# Patient Record
Sex: Female | Born: 1960 | Race: White | Hispanic: No | Marital: Married | Smoking: Former smoker
Health system: Southern US, Community
[De-identification: ages and names within clinical notes are randomized; demographics above are authoritative.]

## PROBLEM LIST (undated history)

## (undated) DIAGNOSIS — E538 Deficiency of other specified B group vitamins: Secondary | ICD-10-CM

## (undated) DIAGNOSIS — F419 Anxiety disorder, unspecified: Secondary | ICD-10-CM

## (undated) DIAGNOSIS — T7840XA Allergy, unspecified, initial encounter: Secondary | ICD-10-CM

## (undated) DIAGNOSIS — E1110 Type 2 diabetes mellitus with ketoacidosis without coma: Secondary | ICD-10-CM

## (undated) DIAGNOSIS — N182 Chronic kidney disease, stage 2 (mild): Secondary | ICD-10-CM

## (undated) DIAGNOSIS — G629 Polyneuropathy, unspecified: Secondary | ICD-10-CM

## (undated) DIAGNOSIS — R42 Dizziness and giddiness: Secondary | ICD-10-CM

## (undated) DIAGNOSIS — R079 Chest pain, unspecified: Secondary | ICD-10-CM

## (undated) DIAGNOSIS — E039 Hypothyroidism, unspecified: Secondary | ICD-10-CM

## (undated) DIAGNOSIS — E785 Hyperlipidemia, unspecified: Secondary | ICD-10-CM

## (undated) DIAGNOSIS — R252 Cramp and spasm: Secondary | ICD-10-CM

## (undated) DIAGNOSIS — IMO0002 Reserved for concepts with insufficient information to code with codable children: Secondary | ICD-10-CM

## (undated) DIAGNOSIS — I10 Essential (primary) hypertension: Secondary | ICD-10-CM

## (undated) DIAGNOSIS — K589 Irritable bowel syndrome without diarrhea: Secondary | ICD-10-CM

## (undated) DIAGNOSIS — K429 Umbilical hernia without obstruction or gangrene: Secondary | ICD-10-CM

## (undated) DIAGNOSIS — I509 Heart failure, unspecified: Secondary | ICD-10-CM

## (undated) DIAGNOSIS — E1065 Type 1 diabetes mellitus with hyperglycemia: Secondary | ICD-10-CM

## (undated) DIAGNOSIS — K219 Gastro-esophageal reflux disease without esophagitis: Secondary | ICD-10-CM

## (undated) HISTORY — DX: Anxiety disorder, unspecified: F41.9

## (undated) HISTORY — DX: Hyperlipidemia, unspecified: E78.5

## (undated) HISTORY — DX: Irritable bowel syndrome, unspecified: K58.9

## (undated) HISTORY — DX: Dizziness and giddiness: R42

## (undated) HISTORY — DX: Polyneuropathy, unspecified: G62.9

## (undated) HISTORY — DX: Deficiency of other specified B group vitamins: E53.8

## (undated) HISTORY — DX: Allergy, unspecified, initial encounter: T78.40XA

## (undated) HISTORY — DX: Gastro-esophageal reflux disease without esophagitis: K21.9

## (undated) HISTORY — DX: Hypothyroidism, unspecified: E03.9

---

## 1990-12-10 HISTORY — PX: TUBAL LIGATION: SHX77

## 2000-01-29 ENCOUNTER — Encounter (INDEPENDENT_AMBULATORY_CARE_PROVIDER_SITE_OTHER): Payer: Self-pay

## 2000-01-29 ENCOUNTER — Other Ambulatory Visit: Admission: RE | Admit: 2000-01-29 | Discharge: 2000-01-29 | Payer: Self-pay | Admitting: Gastroenterology

## 2001-08-20 ENCOUNTER — Emergency Department (HOSPITAL_COMMUNITY): Admission: EM | Admit: 2001-08-20 | Discharge: 2001-08-20 | Payer: Self-pay | Admitting: Emergency Medicine

## 2001-08-20 ENCOUNTER — Encounter: Payer: Self-pay | Admitting: Emergency Medicine

## 2005-06-18 ENCOUNTER — Ambulatory Visit: Payer: Self-pay | Admitting: Internal Medicine

## 2007-08-31 ENCOUNTER — Emergency Department (HOSPITAL_COMMUNITY): Admission: EM | Admit: 2007-08-31 | Discharge: 2007-08-31 | Payer: Self-pay | Admitting: Emergency Medicine

## 2008-01-19 ENCOUNTER — Observation Stay (HOSPITAL_COMMUNITY): Admission: EM | Admit: 2008-01-19 | Discharge: 2008-01-20 | Payer: Self-pay | Admitting: Emergency Medicine

## 2008-01-19 ENCOUNTER — Ambulatory Visit: Payer: Self-pay | Admitting: Family Medicine

## 2008-02-10 ENCOUNTER — Observation Stay (HOSPITAL_COMMUNITY): Admission: AD | Admit: 2008-02-10 | Discharge: 2008-02-13 | Payer: Self-pay | Admitting: Gastroenterology

## 2008-02-10 ENCOUNTER — Ambulatory Visit: Payer: Self-pay | Admitting: Gastroenterology

## 2008-02-12 ENCOUNTER — Encounter: Payer: Self-pay | Admitting: Gastroenterology

## 2008-02-13 ENCOUNTER — Encounter: Payer: Self-pay | Admitting: Gastroenterology

## 2008-02-16 ENCOUNTER — Ambulatory Visit: Payer: Self-pay | Admitting: Gastroenterology

## 2008-03-04 ENCOUNTER — Ambulatory Visit: Payer: Self-pay | Admitting: Gastroenterology

## 2008-03-10 ENCOUNTER — Ambulatory Visit (HOSPITAL_COMMUNITY): Admission: RE | Admit: 2008-03-10 | Discharge: 2008-03-10 | Payer: Self-pay | Admitting: Gastroenterology

## 2008-04-29 ENCOUNTER — Ambulatory Visit: Payer: Self-pay | Admitting: Family Medicine

## 2008-04-29 DIAGNOSIS — K589 Irritable bowel syndrome without diarrhea: Secondary | ICD-10-CM | POA: Insufficient documentation

## 2008-04-29 DIAGNOSIS — G43009 Migraine without aura, not intractable, without status migrainosus: Secondary | ICD-10-CM | POA: Insufficient documentation

## 2008-04-29 DIAGNOSIS — E039 Hypothyroidism, unspecified: Secondary | ICD-10-CM | POA: Insufficient documentation

## 2008-04-29 DIAGNOSIS — E785 Hyperlipidemia, unspecified: Secondary | ICD-10-CM | POA: Insufficient documentation

## 2008-04-30 ENCOUNTER — Encounter: Payer: Self-pay | Admitting: Family Medicine

## 2008-06-16 ENCOUNTER — Ambulatory Visit: Payer: Self-pay | Admitting: Family Medicine

## 2008-06-22 ENCOUNTER — Encounter: Payer: Self-pay | Admitting: Family Medicine

## 2008-06-22 ENCOUNTER — Other Ambulatory Visit: Admission: RE | Admit: 2008-06-22 | Discharge: 2008-06-22 | Payer: Self-pay | Admitting: Family Medicine

## 2008-06-22 ENCOUNTER — Telehealth: Payer: Self-pay | Admitting: Family Medicine

## 2008-06-22 ENCOUNTER — Ambulatory Visit: Payer: Self-pay | Admitting: Family Medicine

## 2008-06-22 DIAGNOSIS — F411 Generalized anxiety disorder: Secondary | ICD-10-CM | POA: Insufficient documentation

## 2008-06-22 LAB — CONVERTED CEMR LAB: Pap Smear: NORMAL

## 2008-06-23 ENCOUNTER — Encounter: Admission: RE | Admit: 2008-06-23 | Discharge: 2008-06-23 | Payer: Self-pay | Admitting: Family Medicine

## 2008-06-23 LAB — CONVERTED CEMR LAB
ALT: 12 units/L (ref 0–35)
AST: 16 units/L (ref 0–37)
Albumin: 3.6 g/dL (ref 3.5–5.2)
Alkaline Phosphatase: 61 units/L (ref 39–117)
BUN: 9 mg/dL (ref 6–23)
Bilirubin, Direct: 0.1 mg/dL (ref 0.0–0.3)
CO2: 28 meq/L (ref 19–32)
Calcium: 9 mg/dL (ref 8.4–10.5)
Chloride: 104 meq/L (ref 96–112)
Cholesterol: 219 mg/dL (ref 0–200)
Creatinine, Ser: 0.9 mg/dL (ref 0.4–1.2)
Direct LDL: 179 mg/dL
GFR calc Af Amer: 87 mL/min
GFR calc non Af Amer: 72 mL/min
Glucose, Bld: 103 mg/dL — ABNORMAL HIGH (ref 70–99)
HDL: 28.6 mg/dL — ABNORMAL LOW (ref >39.0)
Hgb A1c MFr Bld: 6.3 % — ABNORMAL HIGH (ref 4.6–6.0)
Potassium: 4.1 meq/L (ref 3.5–5.1)
Sodium: 138 meq/L (ref 135–145)
Total Bilirubin: 0.7 mg/dL (ref 0.3–1.2)
Total CHOL/HDL Ratio: 7.7
Total Protein: 6.8 g/dL (ref 6.0–8.3)
Triglycerides: 126 mg/dL (ref 0–149)
VLDL: 25 mg/dL (ref 0–40)

## 2008-06-24 ENCOUNTER — Encounter (INDEPENDENT_AMBULATORY_CARE_PROVIDER_SITE_OTHER): Payer: Self-pay | Admitting: *Deleted

## 2008-06-29 ENCOUNTER — Encounter: Payer: Self-pay | Admitting: Family Medicine

## 2008-06-29 ENCOUNTER — Encounter: Admission: RE | Admit: 2008-06-29 | Discharge: 2008-08-31 | Payer: Self-pay | Admitting: Family Medicine

## 2008-07-12 ENCOUNTER — Telehealth: Payer: Self-pay | Admitting: Internal Medicine

## 2008-09-30 ENCOUNTER — Ambulatory Visit: Payer: Self-pay | Admitting: Family Medicine

## 2008-10-05 ENCOUNTER — Ambulatory Visit: Payer: Self-pay | Admitting: Family Medicine

## 2008-10-05 LAB — CONVERTED CEMR LAB
Cholesterol, target level: 200 mg/dL
HDL goal, serum: 40 mg/dL
LDL Goal: 100 mg/dL

## 2008-10-14 ENCOUNTER — Emergency Department (HOSPITAL_COMMUNITY): Admission: EM | Admit: 2008-10-14 | Discharge: 2008-10-14 | Payer: Self-pay | Admitting: Emergency Medicine

## 2008-10-20 LAB — CONVERTED CEMR LAB
Glucose, Bld: 125 mg/dL — ABNORMAL HIGH (ref 70–99)
HDL: 30.1 mg/dL — ABNORMAL LOW (ref >39.0)
Hgb A1c MFr Bld: 6.2 % — ABNORMAL HIGH (ref 4.6–6.0)
Triglycerides: 128 mg/dL (ref 0–149)
VLDL: 26 mg/dL (ref 0–40)

## 2009-01-04 ENCOUNTER — Ambulatory Visit: Payer: Self-pay | Admitting: Family Medicine

## 2009-01-05 LAB — CONVERTED CEMR LAB
ALT: 12 units/L (ref 0–35)
AST: 15 units/L (ref 0–37)
Albumin: 3.8 g/dL (ref 3.5–5.2)
BUN: 17 mg/dL (ref 6–23)
CO2: 27 meq/L (ref 19–32)
Calcium: 9.2 mg/dL (ref 8.4–10.5)
Chloride: 103 meq/L (ref 96–112)
Cholesterol: 199 mg/dL (ref 0–200)
Creatinine, Ser: 0.9 mg/dL (ref 0.4–1.2)
Creatinine,U: 35.8 mg/dL
HDL: 36.9 mg/dL — ABNORMAL LOW (ref >39.0)
LDL Cholesterol: 140 mg/dL — ABNORMAL HIGH (ref 0–99)
Microalb, Ur: 0.8 mg/dL (ref 0.0–1.9)
Total Bilirubin: 0.4 mg/dL (ref 0.3–1.2)
Total CHOL/HDL Ratio: 5.4
Triglycerides: 112 mg/dL (ref 0–149)

## 2009-01-07 ENCOUNTER — Ambulatory Visit: Payer: Self-pay | Admitting: Family Medicine

## 2009-01-10 LAB — HM DIABETES EYE EXAM: HM Diabetic Eye Exam: NORMAL

## 2009-03-08 ENCOUNTER — Encounter: Payer: Self-pay | Admitting: Internal Medicine

## 2009-03-08 ENCOUNTER — Observation Stay (HOSPITAL_COMMUNITY): Admission: EM | Admit: 2009-03-08 | Discharge: 2009-03-10 | Payer: Self-pay | Admitting: Emergency Medicine

## 2009-03-08 ENCOUNTER — Ambulatory Visit: Payer: Self-pay | Admitting: Internal Medicine

## 2009-03-08 ENCOUNTER — Ambulatory Visit: Payer: Self-pay | Admitting: Gastroenterology

## 2009-04-01 ENCOUNTER — Ambulatory Visit: Payer: Self-pay | Admitting: Family Medicine

## 2009-04-01 LAB — CONVERTED CEMR LAB
ALT: 14 units/L (ref 0–35)
AST: 20 units/L (ref 0–37)
Albumin: 3.6 g/dL (ref 3.5–5.2)
Alkaline Phosphatase: 63 units/L (ref 39–117)
BUN: 11 mg/dL (ref 6–23)
CO2: 27 meq/L (ref 19–32)
Chloride: 107 meq/L (ref 96–112)
Cholesterol: 185 mg/dL (ref 0–200)
Glucose, Bld: 131 mg/dL — ABNORMAL HIGH (ref 70–99)
Hgb A1c MFr Bld: 6.4 % (ref 4.6–6.5)
Potassium: 3.9 meq/L (ref 3.5–5.1)
Sodium: 139 meq/L (ref 135–145)
TSH: 2.24 microintl units/mL (ref 0.35–5.50)
Total Protein: 6.7 g/dL (ref 6.0–8.3)
VLDL: 20.4 mg/dL (ref 0.0–40.0)

## 2009-04-08 ENCOUNTER — Ambulatory Visit: Payer: Self-pay | Admitting: Family Medicine

## 2009-04-08 DIAGNOSIS — K219 Gastro-esophageal reflux disease without esophagitis: Secondary | ICD-10-CM | POA: Insufficient documentation

## 2009-05-10 ENCOUNTER — Ambulatory Visit: Payer: Self-pay | Admitting: Family Medicine

## 2009-05-10 DIAGNOSIS — G629 Polyneuropathy, unspecified: Secondary | ICD-10-CM

## 2009-07-14 ENCOUNTER — Ambulatory Visit: Payer: Self-pay | Admitting: Family Medicine

## 2009-07-14 DIAGNOSIS — R5381 Other malaise: Secondary | ICD-10-CM | POA: Insufficient documentation

## 2009-07-14 DIAGNOSIS — R5383 Other fatigue: Secondary | ICD-10-CM

## 2009-07-15 ENCOUNTER — Ambulatory Visit: Payer: Self-pay | Admitting: Family Medicine

## 2009-07-15 LAB — CONVERTED CEMR LAB
Albumin: 3.8 g/dL (ref 3.5–5.2)
Alkaline Phosphatase: 63 units/L (ref 39–117)
BUN: 12 mg/dL (ref 6–23)
CO2: 21 meq/L (ref 19–32)
Calcium: 9.3 mg/dL (ref 8.4–10.5)
Cholesterol: 174 mg/dL (ref 0–200)
Glucose, Bld: 207 mg/dL — ABNORMAL HIGH (ref 70–99)
HDL: 34.6 mg/dL — ABNORMAL LOW (ref >39.00)
Sodium: 136 meq/L (ref 135–145)
Total Protein: 7.1 g/dL (ref 6.0–8.3)
Triglycerides: 151 mg/dL — ABNORMAL HIGH (ref 0.0–149.0)
Vitamin B-12: 258 pg/mL (ref 211–911)

## 2009-09-09 ENCOUNTER — Telehealth: Payer: Self-pay | Admitting: Family Medicine

## 2009-10-17 ENCOUNTER — Ambulatory Visit: Payer: Self-pay | Admitting: Family Medicine

## 2009-10-17 LAB — CONVERTED CEMR LAB
Albumin: 3.9 g/dL (ref 3.5–5.2)
BUN: 10 mg/dL (ref 6–23)
Calcium: 8.9 mg/dL (ref 8.4–10.5)
Cholesterol: 215 mg/dL — ABNORMAL HIGH (ref 0–200)
GFR calc non Af Amer: 71.55 mL/min (ref >60)
Glucose, Bld: 164 mg/dL — ABNORMAL HIGH (ref 70–99)
HDL: 32.4 mg/dL — ABNORMAL LOW (ref >39.00)
Potassium: 3.5 meq/L (ref 3.5–5.1)
Sodium: 137 meq/L (ref 135–145)
VLDL: 22.2 mg/dL (ref 0.0–40.0)

## 2009-10-20 ENCOUNTER — Ambulatory Visit: Payer: Self-pay | Admitting: Family Medicine

## 2009-10-21 ENCOUNTER — Encounter (INDEPENDENT_AMBULATORY_CARE_PROVIDER_SITE_OTHER): Payer: Self-pay | Admitting: *Deleted

## 2009-12-27 ENCOUNTER — Encounter (INDEPENDENT_AMBULATORY_CARE_PROVIDER_SITE_OTHER): Payer: Self-pay | Admitting: *Deleted

## 2009-12-28 ENCOUNTER — Ambulatory Visit: Payer: Self-pay | Admitting: Family Medicine

## 2009-12-29 LAB — CONVERTED CEMR LAB
ALT: 13 units/L (ref 0–35)
BUN: 10 mg/dL (ref 6–23)
CO2: 25 meq/L (ref 19–32)
Chloride: 107 meq/L (ref 96–112)
Cholesterol: 198 mg/dL (ref 0–200)
Creatinine, Ser: 0.9 mg/dL (ref 0.4–1.2)
Glucose, Bld: 110 mg/dL — ABNORMAL HIGH (ref 70–99)
Hgb A1c MFr Bld: 6.6 % — ABNORMAL HIGH (ref 4.6–6.5)
Microalb Creat Ratio: 3.4 mg/g (ref 0.0–30.0)
Total CHOL/HDL Ratio: 6
Total Protein: 7 g/dL (ref 6.0–8.3)
Triglycerides: 115 mg/dL (ref 0.0–149.0)

## 2010-01-03 ENCOUNTER — Ambulatory Visit: Payer: Self-pay | Admitting: Family Medicine

## 2010-01-03 DIAGNOSIS — R252 Cramp and spasm: Secondary | ICD-10-CM

## 2010-03-29 ENCOUNTER — Ambulatory Visit: Payer: Self-pay | Admitting: Family Medicine

## 2010-03-29 LAB — CONVERTED CEMR LAB
ALT: 14 units/L (ref 0–35)
AST: 19 units/L (ref 0–37)
Alkaline Phosphatase: 66 units/L (ref 39–117)
BUN: 13 mg/dL (ref 6–23)
Bilirubin, Direct: 0.1 mg/dL (ref 0.0–0.3)
Direct LDL: 156.4 mg/dL
Eosinophils Relative: 2.2 % (ref 0.0–5.0)
GFR calc non Af Amer: 71.41 mL/min (ref >60)
HCT: 37.6 % (ref 36.0–46.0)
Hgb A1c MFr Bld: 6.8 % — ABNORMAL HIGH (ref 4.6–6.5)
Lymphocytes Relative: 28.7 % (ref 12.0–46.0)
Lymphs Abs: 3.1 10*3/uL (ref 0.7–4.0)
Monocytes Relative: 6.1 % (ref 3.0–12.0)
Platelets: 343 10*3/uL (ref 150.0–400.0)
Potassium: 4.2 meq/L (ref 3.5–5.1)
Sodium: 138 meq/L (ref 135–145)
TSH: 15.44 microintl units/mL — ABNORMAL HIGH (ref 0.35–5.50)
Total Bilirubin: 0.3 mg/dL (ref 0.3–1.2)
Total CHOL/HDL Ratio: 6
VLDL: 45.2 mg/dL — ABNORMAL HIGH (ref 0.0–40.0)
Vitamin B-12: 284 pg/mL (ref 211–911)
WBC: 10.9 10*3/uL — ABNORMAL HIGH (ref 4.5–10.5)

## 2010-04-04 ENCOUNTER — Ambulatory Visit: Payer: Self-pay | Admitting: Family Medicine

## 2010-04-04 DIAGNOSIS — E538 Deficiency of other specified B group vitamins: Secondary | ICD-10-CM | POA: Insufficient documentation

## 2010-04-14 ENCOUNTER — Emergency Department (HOSPITAL_COMMUNITY): Admission: EM | Admit: 2010-04-14 | Discharge: 2010-04-14 | Payer: Self-pay | Admitting: Emergency Medicine

## 2010-05-02 ENCOUNTER — Encounter (INDEPENDENT_AMBULATORY_CARE_PROVIDER_SITE_OTHER): Payer: Self-pay | Admitting: *Deleted

## 2010-07-06 ENCOUNTER — Encounter (INDEPENDENT_AMBULATORY_CARE_PROVIDER_SITE_OTHER): Payer: Self-pay | Admitting: *Deleted

## 2010-08-02 ENCOUNTER — Ambulatory Visit: Payer: Self-pay | Admitting: Family Medicine

## 2010-08-03 LAB — CONVERTED CEMR LAB
AST: 17 units/L (ref 0–37)
Albumin: 4.2 g/dL (ref 3.5–5.2)
Alkaline Phosphatase: 63 units/L (ref 39–117)
Bilirubin, Direct: 0.1 mg/dL (ref 0.0–0.3)
CO2: 26 meq/L (ref 19–32)
Glucose, Bld: 168 mg/dL — ABNORMAL HIGH (ref 70–99)
Potassium: 4.2 meq/L (ref 3.5–5.1)
Sodium: 138 meq/L (ref 135–145)
Total Protein: 7.2 g/dL (ref 6.0–8.3)

## 2010-08-15 ENCOUNTER — Ambulatory Visit: Payer: Self-pay | Admitting: Family Medicine

## 2010-08-15 DIAGNOSIS — K921 Melena: Secondary | ICD-10-CM

## 2010-08-15 LAB — CONVERTED CEMR LAB
Basophils Relative: 0.4 % (ref 0.0–3.0)
Eosinophils Absolute: 0.2 10*3/uL (ref 0.0–0.7)
HCT: 40.5 % (ref 36.0–46.0)
Lymphs Abs: 3.4 10*3/uL (ref 0.7–4.0)
MCHC: 33 g/dL (ref 30.0–36.0)
MCV: 80.2 fL (ref 78.0–100.0)
Monocytes Absolute: 0.7 10*3/uL (ref 0.1–1.0)
Neutrophils Relative %: 62.7 % (ref 43.0–77.0)
Platelets: 353 10*3/uL (ref 150.0–400.0)
RBC: 5.05 M/uL (ref 3.87–5.11)

## 2010-09-15 ENCOUNTER — Encounter (INDEPENDENT_AMBULATORY_CARE_PROVIDER_SITE_OTHER): Payer: Self-pay | Admitting: *Deleted

## 2010-11-16 ENCOUNTER — Emergency Department (HOSPITAL_COMMUNITY): Admission: EM | Admit: 2010-11-16 | Discharge: 2010-09-13 | Payer: Self-pay | Admitting: Emergency Medicine

## 2010-12-07 ENCOUNTER — Ambulatory Visit: Payer: Self-pay | Admitting: Family Medicine

## 2010-12-12 ENCOUNTER — Ambulatory Visit
Admission: RE | Admit: 2010-12-12 | Discharge: 2010-12-12 | Payer: Self-pay | Source: Home / Self Care | Attending: Family Medicine | Admitting: Family Medicine

## 2010-12-12 ENCOUNTER — Ambulatory Visit: Admit: 2010-12-12 | Payer: Self-pay | Admitting: Family Medicine

## 2010-12-12 LAB — CONVERTED CEMR LAB
HDL: 32.9 mg/dL — ABNORMAL LOW (ref >39.00)
VLDL: 21 mg/dL (ref 0.0–40.0)

## 2010-12-15 ENCOUNTER — Ambulatory Visit
Admission: RE | Admit: 2010-12-15 | Discharge: 2010-12-15 | Payer: Self-pay | Source: Home / Self Care | Attending: Family Medicine | Admitting: Family Medicine

## 2010-12-15 DIAGNOSIS — R42 Dizziness and giddiness: Secondary | ICD-10-CM | POA: Insufficient documentation

## 2011-01-01 ENCOUNTER — Encounter: Payer: Self-pay | Admitting: Family Medicine

## 2011-01-10 ENCOUNTER — Other Ambulatory Visit: Payer: Self-pay

## 2011-01-10 ENCOUNTER — Ambulatory Visit: Admit: 2011-01-10 | Payer: Self-pay | Admitting: Family Medicine

## 2011-01-11 NOTE — Assessment & Plan Note (Signed)
Summary: F/U AFTER LABS / LFW   Vital Signs:  Patient profile:   50 year old female Height:      60 inches Weight:      159 pounds BMI:     31.16 Temp:     97.9 degrees F oral Pulse rate:   84 / minute Pulse rhythm:   regular BP sitting:   110 / 80  (left arm) Cuff size:   regular  Vitals Entered By: Linde Gillis CMA Duncan Dull) (August 15, 2010 10:40 AM) CC: three month follow up after labs   History of Present Illness: DM, well controlled on Byetta. 6 lb weight loss since last OV.  Some associated nausea if she doesn't eat.   High cholesterol..poor control on crestor 20 mg and fish oil  Hypothyroid.Marland Kitchenadjusted dose last OV No showed for lab recheck in 4-6 weeks. Not oredered in recent lab panel. Will order today to recheck.  Fatigue and leg cramps..improved some.  Colonoscopy 2009 with Dr. Jarold Motto and upper GI..for  bright red blood in stool and similar abdominal cramping. No etiology of blood seen, and pain atrributed to IBS. Abd [place] was negative, no gallstones.  Went to ER for bright redblood in stool 2 months ago. No since then.  Food/eating  causes nausea, occ low abdominal pain she associates this with IBS.  GERD, using omeprazole. No rectal pain or pressure.  Some hard stools, no straining.  Some looser stools.   SOme worsening with menses.  Jammed finger.. not sure how. mild tenderness at tip. No bruising. Mild swelling in DIP joint.   Problems Prior to Update: 1)  B12 Deficiency  (ICD-266.2) 2)  Leg Cramps  (ICD-729.82) 3)  Other Malaise and Fatigue  (ICD-780.79) 4)  Peripheral Neuropathy  (ICD-356.9) 5)  Gerd  (ICD-530.81) 6)  Well Woman  (ICD-V70.0) 7)  Other Screening Mammogram  (ICD-V76.12) 8)  Anxiety Disorder, Generalized  (ICD-300.02) 9)  Aodm  (ICD-250.00) 10)  Migraine, Common  (ICD-346.10) 11)  Allergic Rhinitis  (ICD-477.9) 12)  Hyperlipidemia  (ICD-272.4) 13)  Hypothyroidism  (ICD-244.9) 14)  Irritable Bowel Syndrome   (ICD-564.1)  Current Medications (verified): 1)  Levothyroxine Sodium 150 Mcg Tabs (Levothyroxine Sodium) .... Take 1 Tablet By Mouth Once A Day 2)  Sertraline Hcl 50 Mg Tabs (Sertraline Hcl) .Marland Kitchen.. 1 Tab By Mouth Daily 3)  Crestor 40 Mg Tabs (Rosuvastatin Calcium) .... 1/2 Tab By Mouth Daily 4)  Test Strips For One Touch Ultra .... Check Blood Sugar Once Daily Dx 790.29 5)  Onetouch Ultrasoft Lancets   Misc (Lancets) .... Check Blood Sugar Daily Dx 790.29 6)  Alprazolam 0.25 Mg Tabs (Alprazolam) .Marland Kitchen.. 1 Tab By Mouth Daily As Needed Panic Attacks 7)  Omeprazole 40 Mg Cpdr (Omeprazole) .... Take 1 Tablet By Mouth Once A Day 8)  Promethazine Hcl 25 Mg Tabs (Promethazine Hcl) .... As Needed 9)  Sumatriptan Succinate 100 Mg Tabs (Sumatriptan Succinate) .Marland Kitchen.. 1 Tab By Mouth X 1, If Headache Not Gone Repeat in 2 Hours 10)  Amitriptyline Hcl 25 Mg Tabs (Amitriptyline Hcl) .Marland Kitchen.. 1 Tab By Mouth At Bedtime 11)  Byetta 5 Mcg Pen 5 Mcg/0.77ml Soln (Exenatide) .Marland Kitchen.. 1 Inj Subcutaneously Two Times A Day 12)  One Touch Select Test Strips .... Check Blood Sugars Daily Dx 250.00 13)  Pens For Byetta Needles..pt Choice .Marland Kitchen.. 1 Inj Two Times A Day 14)  Vitamin D (Ergocalciferol) 50000 Unit Caps (Ergocalciferol) .Marland Kitchen.. 1 Cap By Mouth Weekly X 12 Weeks  Allergies: 1)  !  Codeine  Past History:  Past medical, surgical, family and social histories (including risk factors) reviewed, and no changes noted (except as noted below).  Past Medical History: Reviewed history from 03/08/2009 and no changes required. Hypothyroidism Hyperlipidemia diabetes - diet controlled Allergic rhinitis  Past Surgical History: Reviewed history from 03/08/2009 and no changes required. 1989 C-section BTL 1992 colonoscopy/EGD 2009: nml  Family History: Reviewed history from 04/29/2008 and no changes required. father: no contact mother: died age 82 CHF,pericarditis unclear cause, was on diet pills MGF: massive MI age 19 MGM: DM,  alzheimer's, CAD  Social History: Reviewed history from 04/29/2008 and no changes required. 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 CV:  Denies chest pain or discomfort. Resp:  Denies shortness of breath. GI:  Denies abdominal pain. GU:  Denies abnormal vaginal bleeding and dysuria.  Physical Exam  General:  obese appearing female in NAD  Mouth:  Oral mucosa and oropharynx without lesions or exudates.  Teeth in good repair. Neck:  no carotid bruit or thyromegaly no cervical or supraclavicular lymphadenopathy  Lungs:  Normal respiratory effort, chest expands symmetrically. Lungs are clear to auscultation, no crackles or wheezes. Heart:  Normal rate and regular rhythm. S1 and S2 normal without gallop, murmur, click, rub or other extra sounds. Abdomen:  ttp in right lower quadrant Rectal:  anoscopy showed no mases, no fissures but small internal hemorrhoid(s) no current oozing blood.  Stool negative for blood.  Rectal tone nml, but tender to exam  Msk:  right 5th dgitis, full ROM, ttp at PIP joint Pulses:  R and L posterior tibial pulses are full and equal bilaterally  Extremities:  no edema  Diabetes Management Exam:    Foot Exam (with socks and/or shoes not present):       Sensory-Pinprick/Light touch:          Left medial foot (L-4): normal          Left dorsal foot (L-5): normal          Left lateral foot (S-1): normal          Right medial foot (L-4): normal          Right dorsal foot (L-5): normal          Right lateral foot (S-1): normal       Sensory-Monofilament:          Left foot: normal          Right foot: normal       Inspection:          Left foot: normal          Right foot: normal       Nails:          Left foot: normal          Right foot: normal   Impression &  Recommendations:  Problem # 1:  HEMATOCHEZIA (ICD-578.1) Most likely due to internal hemmorhoids. Check CBc. Nml colonoscopy in 2009.  Orders: TLB-CBC Platelet - w/Differential (85025-CBCD) Anoscopy ([address])  Problem # 2:  IRRITABLE BOWEL SYNDROME (ICD-564.1) Poor control...likely cause of nausea and low abdmoinal pain. stool cahnges. Increase fiber in diet, avoid fatty foods. Use bentyl as needed cramps.   Problem # 3:  GERD (ICD-530.81) SOme occ heartburn and frequent nausea may be due to this. Has been on omeprazole  for 1 year given cannot afford nexium. IN 2009 endocscopy was nml.  Her updated medication list for this problem includes:    Omeprazole 40 Mg Cpdr (Omeprazole) .Marland Kitchen... Take 1 tablet by mouth once a day    Dicyclomine Hcl 10 Mg Caps (Dicyclomine hcl) .Marland Kitchen... 1-2 cap by mouth three times a day as needed abdominal cramping/spasm  Problem # 4:  AODM (ICD-250.00) Well controlled. Continue current medication.  Her updated medication list for this problem includes:    Byetta 5 Mcg Pen 5 Mcg/0.41ml Soln (Exenatide) .Marland Kitchen... 1 inj subcutaneously two times a day  Problem # 5:  HYPERLIPIDEMIA (ICD-272.4) LDL poor control. If not at goal next OV will need to add welchol.  Triglycerides better on fish oil.  Her updated medication list for this problem includes:    Crestor 40 Mg Tabs (Rosuvastatin calcium) .Marland Kitchen... 1/2 tab by mouth daily  Problem # 6:  HYPOTHYROIDISM (ICD-244.9) Due for reeval.  Her updated medication list for this problem includes:    Levothyroxine Sodium 150 Mcg Tabs (Levothyroxine sodium) .Marland Kitchen... Take 1 tablet by mouth once a day  Orders: TLB-TSH (Thyroid Stimulating Hormone) (84443-TSH)  Complete Medication List: 1)  Levothyroxine Sodium 150 Mcg Tabs (Levothyroxine sodium) .... Take 1 tablet by mouth once a day 2)  Sertraline Hcl 50 Mg Tabs (Sertraline hcl) .Marland Kitchen.. 1 tab by mouth daily 3)  Crestor 40 Mg Tabs (Rosuvastatin calcium) .... 1/2 tab by mouth daily 4)  Test  Strips For One Touch Ultra  .... Check blood sugar once daily dx 790.29 5)  Onetouch Ultrasoft Lancets Misc (Lancets) .... Check blood sugar daily dx 790.29 6)  Alprazolam 0.25 Mg Tabs (Alprazolam) .Marland Kitchen.. 1 tab by mouth daily as needed panic attacks 7)  Omeprazole 40 Mg Cpdr (Omeprazole) .... Take 1 tablet by mouth once a day 8)  Promethazine Hcl 25 Mg Tabs (Promethazine hcl) .... As needed 9)  Sumatriptan Succinate 100 Mg Tabs (Sumatriptan succinate) .Marland Kitchen.. 1 tab by mouth x 1, if headache not gone repeat in 2 hours 10)  Amitriptyline Hcl 25 Mg Tabs (Amitriptyline hcl) .Marland Kitchen.. 1 tab by mouth at bedtime 11)  Byetta 5 Mcg Pen 5 Mcg/0.80ml Soln (Exenatide) .Marland Kitchen.. 1 inj subcutaneously two times a day 12)  One Touch Select Test Strips  .... Check blood sugars daily dx 250.00 13)  Pens For Byetta Needles..pt Choice  .Marland Kitchen.. 1 inj two times a day 14)  Vitamin D (ergocalciferol) 50000 Unit Caps (Ergocalciferol) .Marland Kitchen.. 1 cap by mouth weekly x 12 weeks 15)  Dicyclomine Hcl 10 Mg Caps (Dicyclomine hcl) .Marland Kitchen.. 1-2 cap by mouth three times a day as needed abdominal cramping/spasm  Patient Instructions: 1)  Continue omeprazole 40 mg daily. 2)  Avoid fatty foods to help with IBS. 3)  Use bentyl for abdominal spasm. 4)  If not improving return to see Dr. Jarold Motto. 5)  Get back on track with diet and start exercsie. 6)  Continue fish oil. 7)  Please schedule a follow-up appointment in 3 months 30 min OV. 8)  Lipid panel prior to visit ICD-9 : 250.00, 272.0 9)  HgBA1c prior to visit  ICD-9:  Prescriptions: DICYCLOMINE HCL 10 MG CAPS (DICYCLOMINE HCL) 1-2 cap by mouth three times a day as needed abdominal cramping/spasm  #60 x 3   Entered and Authorized by:   Kerby Nora MD   Signed by:   Kerby Nora MD on 08/15/2010   Method used:   Electronically to        [facility]  [address] [phone]* (retail)       [address]       [place], [place]  [address]       Ph: [phone] or [phone]       Fax:  [phone]   RxID:   [id number] BYETTA 5 MCG PEN 5 MCG/0.02ML SOLN (EXENATIDE) 1 inj Subcutaneously two times a day  #1 box x 11   Entered and Authorized by:   Kerby Nora MD   Signed by:   Kerby Nora MD on 08/15/2010   Method used:   Electronically to        [facility] [address] [phone]* (retail)       [address]       [place], [place]  [address]       Ph: [phone] or [phone]       Fax: [phone]   RxID:   [id number] AMITRIPTYLINE HCL 25 MG TABS (AMITRIPTYLINE HCL) 1 tab by mouth at bedtime  #30 x 5   Entered and Authorized by:   Kerby Nora MD   Signed by:   Kerby Nora MD on 08/15/2010   Method used:   Electronically to        [facility] [address] [phone]* (retail)       [address]       [place], [place]  [address]       Ph: [phone] or [phone]       Fax: [phone]   RxID:   [id number] OMEPRAZOLE 40 MG CPDR (OMEPRAZOLE) Take 1 tablet by mouth once a day  #90 x 3   Entered and Authorized by:   Kerby Nora MD   Signed by:   Kerby Nora MD on 08/15/2010   Method used:   Electronically to        [facility] [address] [phone]* (retail)       [address]       [place], [place]  [address]       Ph: [phone] or [phone]       Fax: [phone]   RxID:   [id number] CRESTOR 40 MG TABS (ROSUVASTATIN CALCIUM) 1/2 tab by mouth daily  #30 x 11   Entered and Authorized by:   Kerby Nora MD   Signed by:   Kerby Nora MD on 08/15/2010   Method used:   Electronically to        [facility] [address] [phone]* (retail)       [address]       [place], [place]  [address]       Ph: [phone] or [phone]       Fax: [phone]   RxID:   [id number] SERTRALINE HCL 50 MG TABS (SERTRALINE HCL) 1 tab by mouth daily  #30 x 11   Entered and Authorized by:   Kerby Nora MD   Signed by:   Kerby Nora MD on 08/15/2010   Method used:   Electronically to         [facility] [address] [phone]* (retail)       [address]       [place], [place]  [address]       Ph: [phone] or [phone]       Fax: [phone]   RxID:   [id number] LEVOTHYROXINE SODIUM 150 MCG TABS (LEVOTHYROXINE SODIUM) Take 1 tablet by mouth once a day  #30 x 11   Entered and Authorized by:   Kerby Nora MD   Signed by:   Kerby Nora MD on 08/15/2010   Method used:   Electronically to        [facility] [address] [phone]* (retail)       [address]       [place], [place]  [address]       Ph: [phone] or [phone]       Fax: [phone]   RxID:   [id number]   Current Allergies (reviewed today): ! CODEINE  Appended Document: F/U AFTER LABS / LFW Flu Vaccine Consent Questions     Do you have a history of severe allergic reactions to this vaccine? no    Any prior history of allergic reactions to egg and/or gelatin? no    Do you have a sensitivity to the preservative Thimersol? no    Do you have a past history of Guillan-Barre Syndrome? no    Do you currently have an acute febrile illness? no    Have you ever had a severe reaction to latex? no    Vaccine information given and explained to patient? yes    Are you currently pregnant? no    Lot Number:[id number]   Exp Date:06/09/2011   Site Given  Right Deltoid IM     Allergies: 1)  ! Codeine   Complete Medication List: 1)  Levothyroxine Sodium 150 Mcg Tabs (Levothyroxine sodium) .... Take 1 tablet by mouth once a day 2)  Sertraline Hcl 50 Mg Tabs (Sertraline hcl) .Marland Kitchen.. 1 tab by mouth daily 3)  Crestor 40 Mg Tabs (Rosuvastatin calcium) .... 1/2 tab by mouth daily 4)  Test Strips For One Touch Ultra  .... Check blood sugar once daily dx 790.29 5)  Onetouch Ultrasoft Lancets Misc (Lancets) .... Check blood sugar daily dx 790.29 6)  Alprazolam 0.25 Mg Tabs (Alprazolam) .Marland Kitchen.. 1 tab by mouth daily as needed panic attacks 7)  Omeprazole 40 Mg Cpdr (Omeprazole)  .... Take 1 tablet by mouth once a day 8)  Promethazine Hcl 25 Mg Tabs (Promethazine hcl) .... As needed 9)  Sumatriptan Succinate 100 Mg Tabs (Sumatriptan succinate) .Marland Kitchen.. 1 tab by mouth x 1, if headache not gone repeat in 2 hours 10)  Amitriptyline Hcl 25 Mg Tabs (Amitriptyline hcl) .Marland Kitchen.. 1 tab by mouth at bedtime 11)  Byetta 5 Mcg Pen 5 Mcg/0.78ml Soln (Exenatide) .Marland Kitchen.. 1 inj subcutaneously two times a day 12)  One Touch Select Test Strips  .... Check blood sugars daily dx 250.00 13)  Pens For Byetta Needles..pt Choice  .Marland Kitchen.. 1 inj two times a day 14)  Vitamin D (ergocalciferol) 50000 Unit Caps (Ergocalciferol) .Marland Kitchen.. 1 cap by mouth weekly x 12 weeks 15)  Dicyclomine Hcl 10 Mg Caps (Dicyclomine hcl) .Marland Kitchen.. 1-2 cap by mouth three times a day as needed abdominal cramping/spasm  Other Orders: Admin 1st Vaccine (28315) Flu Vaccine 34yrs + (176[address])

## 2011-01-11 NOTE — Letter (Signed)
Summary: [place] No Show Letter  [place] at [facility]  [address] [place], [place] [address]   Phone: [phone]  Fax: [phone]    12/27/2009 MRN: [id number]  DESERA GRAFFEO [address] [place], [place]  [address]   Dear Ms. Carolan Clines,   Our records indicate that you missed your scheduled appointment with ______Lab_______________ on ___1/18/11_________.  Please contact this office to reschedule your appointment as soon as possible.  It is important that you keep your scheduled appointments with your physician, so we can provide you the best care possible.  Please be advised that there may be a charge for "no show" appointments.    Sincerely,    at [facility]

## 2011-01-11 NOTE — Letter (Signed)
Summary: [place] No Show Letter  [place] at [facility]  [address] [place], [place] [address]   Phone: [phone]  Fax: [phone]    09/15/2010 MRN: [id number]  [name] [name] [address] [place], [place]  [address]   Dear Ms. Carolan Clines,   Our records indicate that you missed your scheduled appointment with ___Lab_________________ on ___10.6.11_________.  Please contact this office to reschedule your appointment as soon as possible.  It is important that you keep your scheduled appointments with your physician, so we can provide you the best care possible.  Please be advised that there may be a charge for "no show" appointments.    Sincerely,   [place] at [facility]

## 2011-01-11 NOTE — Letter (Signed)
Summary: [place] No Show Letter  [place] at [facility]  [address] [place], [place] [address]   Phone: [phone]  Fax: [phone]    05/02/2010 MRN: [id number]  [name] [name] [address] [place], [place]  [address]   Dear [name],   Our records indicate that you missed your scheduled appointment with ____lab_________________ on __5.24.11__________.  Please contact this office to reschedule your appointment as soon as possible.  It is important that you keep your scheduled appointments with your physician, so we can provide you the best care possible.  Please be advised that there may be a charge for "no show" appointments.    Sincerely,   [place] at [facility]

## 2011-01-11 NOTE — Letter (Signed)
Summary: [place] No Show Letter  [place] at [facility]  [address] [place], [place] [address]   Phone: [phone]  Fax: [phone]    07/06/2010 MRN: [id number]  KASSIDY DOCKENDORF [address] [place], [place]  [address]   Dear Ms. Carolan Clines,   Our records indicate that you missed your scheduled appointment with __LAB___________________ on __7.27.11__________.  Please contact this office to reschedule your appointment as soon as possible.  It is important that you keep your scheduled appointments with your physician, so we can provide you the best care possible.  Please be advised that there may be a charge for "no show" appointments.    Sincerely,   [place] at [facility]

## 2011-01-11 NOTE — Assessment & Plan Note (Signed)
Summary: ROA   Vital Signs:  Patient profile:   50 year old female Height:      60 inches Weight:      161.6 pounds BMI:     31.67 Temp:     97.9 degrees F oral Pulse rate:   76 / minute Pulse rhythm:   regular BP sitting:   110 / 70  (left arm) Cuff size:   regular  Vitals Entered By: Benny Lennert CMA Duncan Dull) (January 03, 2010 9:53 AM)  History of Present Illness:   DM, Started on Byetta twice daily. 7 lb weight loss in last 3 months.  Has been compliant with medicaiton.  Occ going to gym.   Tightness in right calf in last week. Has had occ feeling legs are weak B. Occurs randomly.Marland Kitchenocc occurs wen washing dished.  High cholesterol ..increased crestor last OV...leg may be caused by this.   Improved energy on higher thyroid med dose.   Problems Prior to Update: 1)  Other Malaise and Fatigue  (ICD-780.79) 2)  Neck Mass  (ICD-784.2) 3)  Peripheral Neuropathy  (ICD-356.9) 4)  Gerd  (ICD-530.81) 5)  Well Woman  (ICD-V70.0) 6)  Other Screening Mammogram  (ICD-V76.12) 7)  Anxiety Disorder, Generalized  (ICD-300.02) 8)  Aodm  (ICD-250.00) 9)  Migraine, Common  (ICD-346.10) 10)  Allergic Rhinitis  (ICD-477.9) 11)  Hyperlipidemia  (ICD-272.4) 12)  Hypothyroidism  (ICD-244.9) 13)  Irritable Bowel Syndrome  (ICD-564.1)  Current Medications (verified): 1)  Levothyroxine Sodium 125 Mcg Tabs (Levothyroxine Sodium) .Marland Kitchen.. 1 Tab By Mouth Daily 2)  Sertraline Hcl 50 Mg Tabs (Sertraline Hcl) .Marland Kitchen.. 1 Tab By Mouth Daily 3)  Crestor 40 Mg Tabs (Rosuvastatin Calcium) .... 1/2 Tab By Mouth Daily 4)  Test Strips For One Touch Ultra .... Check Blood Sugar Once Daily Dx 790.29 5)  Onetouch Ultrasoft Lancets   Misc (Lancets) .... Check Blood Sugar Daily Dx 790.29 6)  Alprazolam 0.25 Mg Tabs (Alprazolam) .Marland Kitchen.. 1 Tab By Mouth Daily As Needed Panic Attacks 7)  Omeprazole 40 Mg Cpdr (Omeprazole) .... Take 1 Tablet By Mouth Once A Day 8)  Promethazine Hcl 25 Mg Tabs (Promethazine Hcl) .... As  Needed 9)  Sumatriptan Succinate 100 Mg Tabs (Sumatriptan Succinate) .Marland Kitchen.. 1 Tab By Mouth X 1, If Headache Not Gone Repeat in 2 Hours 10)  Amitriptyline Hcl 25 Mg Tabs (Amitriptyline Hcl) .Marland Kitchen.. 1 Tab By Mouth At Bedtime 11)  Byetta 10 Mcg Pen 10 Mcg/0.20ml Soln (Exenatide) .Marland Kitchen.. 1 Inj Two Times A Day 12)  One Touch Select Test Strips .... Check Blood Sugars Daily Dx 250.00 13)  Pens For Byetta Needles..pt Choice .Marland Kitchen.. 1 Inj Two Times A Day 14)  Vitamin D (Ergocalciferol) 50000 Unit Caps (Ergocalciferol) .Marland Kitchen.. 1 Cap By Mouth Weekly X 12 Weeks  Allergies (verified): 1)  ! Codeine  Past History:  Past medical, surgical, family and social histories (including risk factors) reviewed, and no changes noted (except as noted below).  Past Medical History: Reviewed history from 03/08/2009 and no changes required. Hypothyroidism Hyperlipidemia diabetes - diet controlled Allergic rhinitis  Past Surgical History: Reviewed history from 03/08/2009 and no changes required. 1989 C-section BTL 1992 colonoscopy/EGD 2009: nml  Family History: Reviewed history from 04/29/2008 and no changes required. father: no contact mother: died age 2 CHF,pericarditis unclear cause, was on diet pills MGF: massive MI age 59 MGM: DM, alzheimer's, CAD  Social History: Reviewed history from 04/29/2008 and no changes required. 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  General:  Denies fatigue and fever. CV:  Denies chest pain or discomfort. Resp:  Denies shortness of breath. GI:  Complains of abdominal pain; denies bloody stools, constipation, and diarrhea; occ nausea..does not clearly associate with Byetta. Occ reflux.. GU:  Complains of abnormal vaginal bleeding; denies dysuria.  Physical Exam  General:   overweight appearing female  Mouth:  MMM Neck:  no carotid bruit or thyromegaly no cervical or supraclavicular lymphadenopathy  Chest Wall:  No deformities, masses, or tenderness noted. Lungs:  Normal respiratory effort, chest expands symmetrically. Lungs are clear to auscultation, no crackles or wheezes. Heart:  Normal rate and regular rhythm. S1 and S2 normal without gallop, murmur, click, rub or other extra sounds. Abdomen:  Bowel sounds positive,abdomen soft and mild tender in lower abdomen B without masses, organomegaly or hernias noted. Pulses:  R and L posterior tibial pulses are full and equal bilaterally  Extremities:  no edema   Diabetes Management Exam:    Foot Exam (with socks and/or shoes not present):       Sensory-Pinprick/Light touch:          Left medial foot (L-4): normal          Left dorsal foot (L-5): normal          Left lateral foot (S-1): normal          Right medial foot (L-4): normal          Right dorsal foot (L-5): normal          Right lateral foot (S-1): normal       Sensory-Monofilament:          Left foot: normal          Right foot: diminished       Inspection:          Left foot: normal          Right foot: normal       Nails:          Left foot: normal          Right foot: normal   Impression & Recommendations:  Problem # 1:  AODM (ICD-250.00)  A1C trending up.Leanette Ponds tolerating well...would like to increaseBYetta for better sugar and weight control.  Her updated medication list for this problem includes:    Byetta 10 Mcg Pen 10 Mcg/0.75ml Soln (Exenatide) .Marland Kitchen... 1 inj two times a day  Labs Reviewed: Creat: 0.9 (12/28/2009)     Last Eye Exam: normal (01/10/2009) Reviewed HgBA1c results: 6.6 (12/28/2009)  6.5 (10/17/2009)  Problem # 2:  HYPERLIPIDEMIA (ICD-272.4)  Imrpoved but not yet at goal. Encouraged exercise, weight loss, healthy eating habits.  ? causing leg SE..continue current dose...recheck in 3 months.  Her updated  medication list for this problem includes:    Crestor 40 Mg Tabs (Rosuvastatin calcium) .Marland Kitchen... 1/2 tab by mouth daily  Labs Reviewed: SGOT: 19 (12/28/2009)   SGPT: 13 (12/28/2009)  Lipid Goals: Chol Goal: 200 (10/05/2008)   HDL Goal: 40 (10/05/2008)   LDL Goal: 100 (10/05/2008)   TG Goal: 150 (10/05/2008)  Prior 10 Yr Risk Heart Disease: 15 % (10/20/2009)   HDL:33.00 (12/28/2009), 32.40 (10/17/2009)  LDL:142 (12/28/2009), 109 (07/14/2009)  Chol:198 (12/28/2009), 215 (10/17/2009)  Trig:115.0 (12/28/2009), 111.0 (10/17/2009)  Problem # 3:  HYPOTHYROIDISM (ICD-244.9)  Fatigue improved. Recheck in 3 months.  Her updated medication list for this problem includes:    Levothyroxine Sodium 125 Mcg Tabs (Levothyroxine sodium) .Marland Kitchen... 1 tab by mouth daily  Problem # 4:  OTHER MALAISE AND FATIGUE (ICD-780.79) Improved..continur B12and start vit D supplementation.   Problem # 5:  LEG CRAMPS (ICD-729.82) MAy be due to crestor vs low vitd. Encouraged stretching, vit D supplementation. Continue crestor..call if symptoms not improving.   Complete Medication List: 1)  Levothyroxine Sodium 125 Mcg Tabs (Levothyroxine sodium) .Marland Kitchen.. 1 tab by mouth daily 2)  Sertraline Hcl 50 Mg Tabs (Sertraline hcl) .Marland Kitchen.. 1 tab by mouth daily 3)  Crestor 40 Mg Tabs (Rosuvastatin calcium) .... 1/2 tab by mouth daily 4)  Test Strips For One Touch Ultra  .... Check blood sugar once daily dx 790.29 5)  Onetouch Ultrasoft Lancets Misc (Lancets) .... Check blood sugar daily dx 790.29 6)  Alprazolam 0.25 Mg Tabs (Alprazolam) .Marland Kitchen.. 1 tab by mouth daily as needed panic attacks 7)  Omeprazole 40 Mg Cpdr (Omeprazole) .... Take 1 tablet by mouth once a day 8)  Promethazine Hcl 25 Mg Tabs (Promethazine hcl) .... As needed 9)  Sumatriptan Succinate 100 Mg Tabs (Sumatriptan succinate) .Marland Kitchen.. 1 tab by mouth x 1, if headache not gone repeat in 2 hours 10)  Amitriptyline Hcl 25 Mg Tabs (Amitriptyline hcl) .Marland Kitchen.. 1 tab by mouth at bedtime 11)   Byetta 10 Mcg Pen 10 Mcg/0.75ml Soln (Exenatide) .Marland Kitchen.. 1 inj two times a day 12)  One Touch Select Test Strips  .... Check blood sugars daily dx 250.00 13)  Pens For Byetta Needles..pt Choice  .Marland Kitchen.. 1 inj two times a day 14)  Vitamin D (ergocalciferol) 50000 Unit Caps (Ergocalciferol) .Marland Kitchen.. 1 cap by mouth weekly x 12 weeks   Patient Instructions: 1)  BMP prior to visit, ICD-9:  2)  Hepatic Panel prior to visit ICD-9:  3)  Lipid panel prior to visit ICD-9 :  4)  HgBA1c prior to visit  ICD-9:  5)  vit D, vit B12, TSH Dx 244.9, 780.79 6)  Please schedule a follow-up appointment in 3 months  30 min DM, chol etc.  7)  If menses continues to be abnormal..make appt to eval this specifically in next few months.  Prescriptions: VITAMIN D (ERGOCALCIFEROL) 50000 UNIT CAPS (ERGOCALCIFEROL) 1 cap by mouth weekly x 12 weeks  #12 x 0   Entered and Authorized by:   Kerby Nora MD   Signed by:   Kerby Nora MD on 01/03/2010   Method used:   Electronically to        [facility] [phone]* (retail)       [address]       [place], [place]  [address]       Ph: [phone] or [phone]       Fax: [phone]   RxID:   [id number] BYETTA 10 MCG PEN 10 MCG/0.04ML SOLN (EXENATIDE) 1 inj two times a day  #1 box x 11   Entered and Authorized by:   Kerby Nora MD   Signed by:   Kerby Nora MD on 01/03/2010   Method used:   Electronically to        [facility] [phone]* (retail)       [address]       [place], [place]  [address]  Ph: [phone] or [phone]       Fax: [phone]   RxID:   [id number] AMITRIPTYLINE HCL 25 MG TABS (AMITRIPTYLINE HCL) 1 tab by mouth at bedtime  #30 x 5   Entered and Authorized by:   Kerby Nora MD   Signed by:   Kerby Nora MD on 01/03/2010   Method used:   Print then Give to Patient   RxID:   [id number] ALPRAZOLAM 0.25 MG TABS (ALPRAZOLAM) 1 tab by mouth daily as needed  panic attacks  #20 x 0   Entered and Authorized by:   Kerby Nora MD   Signed by:   Kerby Nora MD on 01/03/2010   Method used:   Print then Give to Patient   RxID:   [id number] PENS FOR BYETTA NEEDLES..PT CHOICE 1 inj two times a day  #QS x 1 month x 11   Entered and Authorized by:   Kerby Nora MD   Signed by:   Kerby Nora MD on 01/03/2010   Method used:   Print then Give to Patient   RxID:   [id number] ONE TOUCH SELECT TEST STRIPS Check blood sugars daily Dx 250.00  #QS x 1 month x 11   Entered and Authorized by:   Kerby Nora MD   Signed by:   Kerby Nora MD on 01/03/2010   Method used:   Print then Give to Patient   RxID:   [id number] CRESTOR 40 MG TABS (ROSUVASTATIN CALCIUM) 1/2 tab by mouth daily  #30 x 11   Entered and Authorized by:   Kerby Nora MD   Signed by:   Kerby Nora MD on 01/03/2010   Method used:   Electronically to        [facility] [phone]* (retail)       [address]       [place], [place]  [address]       Ph: [phone] or [phone]       Fax: [phone]   RxID:   [id number] CRESTOR 40 MG TABS (ROSUVASTATIN CALCIUM) 1/2 tab by mouth daily  #30 x 11   Entered and Authorized by:   Kerby Nora MD   Signed by:   Kerby Nora MD on 01/03/2010   Method used:   Electronically to        [facility]  [address] [phone]* (retail)       [address]       [place], [place]  [address]       Ph: [phone]       Fax: [phone]   RxID:   [id number]

## 2011-01-11 NOTE — Assessment & Plan Note (Signed)
Summary: 3 M F/U DM CHOL 30 MIN PER MD/DLO   Vital Signs:  Patient profile:   50 year old female Height:      60 inches Weight:      165 pounds BMI:     32.34 Temp:     98.1 degrees F oral Pulse rate:   100 / minute Pulse rhythm:   regular BP sitting:   106 / 70  (left arm) Cuff size:   regular  Vitals Entered By: Benny Lennert CMA Duncan Dull) (April 04, 2010 11:41 AM)  History of Present Illness: Chief complaint 3 month follow up DM & Cholesterol  DM, well controlled on Byetta..tried using 10 micrograms two times a daybut made her nauseous, so reduced down to 5 micrograms two times a day. 4lb weight gain since last OV..slight increase in A1C.  FBS: 110-130  Walks 2-3 times a week on treadmill.   Lipid Management History:      Positive NCEP/ATP III risk factors include diabetes and HDL cholesterol less than 40.  Negative NCEP/ATP III risk factors include female age less than 37 years old and non-tobacco-user status.        The patient states that she knows about the "Therapeutic Lifestyle Change" diet.  Her compliance with the TLC diet is fair.  Adjunctive measures started by the patient include aerobic exercise, fiber, and weight reduction.  She expresses no side effects from her lipid-lowering medication.  Comments include: poor control  on max dose crestor.  The patient denies any symptoms to suggest myopathy or liver disease.     Problems Prior to Update: 1)  B12 Deficiency  (ICD-266.2) 2)  Leg Cramps  (ICD-729.82) 3)  Other Malaise and Fatigue  (ICD-780.79) 4)  Peripheral Neuropathy  (ICD-356.9) 5)  Gerd  (ICD-530.81) 6)  Well Woman  (ICD-V70.0) 7)  Other Screening Mammogram  (ICD-V76.12) 8)  Anxiety Disorder, Generalized  (ICD-300.02) 9)  Aodm  (ICD-250.00) 10)  Migraine, Common  (ICD-346.10) 11)  Allergic Rhinitis  (ICD-477.9) 12)  Hyperlipidemia  (ICD-272.4) 13)  Hypothyroidism  (ICD-244.9) 14)  Irritable Bowel Syndrome  (ICD-564.1)  Current Medications  (verified): 1)  Levothyroxine Sodium 150 Mcg Tabs (Levothyroxine Sodium) .... Take 1 Tablet By Mouth Once A Day 2)  Sertraline Hcl 50 Mg Tabs (Sertraline Hcl) .Marland Kitchen.. 1 Tab By Mouth Daily 3)  Crestor 40 Mg Tabs (Rosuvastatin Calcium) .... 1/2 Tab By Mouth Daily 4)  Test Strips For One Touch Ultra .... Check Blood Sugar Once Daily Dx 790.29 5)  Onetouch Ultrasoft Lancets   Misc (Lancets) .... Check Blood Sugar Daily Dx 790.29 6)  Alprazolam 0.25 Mg Tabs (Alprazolam) .Marland Kitchen.. 1 Tab By Mouth Daily As Needed Panic Attacks 7)  Omeprazole 40 Mg Cpdr (Omeprazole) .... Take 1 Tablet By Mouth Once A Day 8)  Promethazine Hcl 25 Mg Tabs (Promethazine Hcl) .... As Needed 9)  Sumatriptan Succinate 100 Mg Tabs (Sumatriptan Succinate) .Marland Kitchen.. 1 Tab By Mouth X 1, If Headache Not Gone Repeat in 2 Hours 10)  Amitriptyline Hcl 25 Mg Tabs (Amitriptyline Hcl) .Marland Kitchen.. 1 Tab By Mouth At Bedtime 11)  Byetta 5 Mcg Pen 5 Mcg/0.45ml Soln (Exenatide) .Marland Kitchen.. 1 Inj Subcutaneously Two Times A Day 12)  One Touch Select Test Strips .... Check Blood Sugars Daily Dx 250.00 13)  Pens For Byetta Needles..pt Choice .Marland Kitchen.. 1 Inj Two Times A Day 14)  Vitamin D (Ergocalciferol) 50000 Unit Caps (Ergocalciferol) .Marland Kitchen.. 1 Cap By Mouth Weekly X 12 Weeks  Allergies: 1)  ! Codeine  Past History:  Past medical, surgical, family and social histories (including risk factors) reviewed, and no changes noted (except as noted below).  Past Medical History: Reviewed history from 03/08/2009 and no changes required. Hypothyroidism Hyperlipidemia diabetes - diet controlled Allergic rhinitis  Past Surgical History: Reviewed history from 03/08/2009 and no changes required. 1989 C-section BTL 1992 colonoscopy/EGD 2009: nml  Family History: Reviewed history from 04/29/2008 and no changes required. father: no contact mother: died age 13 CHF,pericarditis unclear cause, was on diet pills MGF: massive MI age 1 MGM: DM, alzheimer's, CAD  Social  History: Reviewed history from 04/29/2008 and no changes required. 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 CV:  Denies chest pain or discomfort. Resp:  Denies shortness of breath, sputum productive, and wheezing; she snores, ? apnea. Occ sudden gasp of breath in night, occ during day intermittantly. GI:  Denies abdominal pain. GU:  Denies dysuria. MS:  mild intermittant  leg cramping..no constant myalgia on crastor. Marland Kitchen  Physical Exam  General:  overweight appearing female  Mouth:  MMM Neck:  no carotid bruit or thyromegaly no cervical or supraclavicular lymphadenopathy  Lungs:  Normal respiratory effort, chest expands symmetrically. Lungs are clear to auscultation, no crackles or wheezes. Heart:  Normal rate and regular rhythm. S1 and S2 normal without gallop, murmur, click, rub or other extra sounds. Abdomen:  Bowel sounds positive,abdomen soft and mild tender in lower abdomen B without masses, organomegaly or hernias noted. Pulses:  R and L posterior tibial pulses are full and equal bilaterally  Extremities:  no edema   Diabetes Management Exam:    Foot Exam (with socks and/or shoes not present):       Sensory-Pinprick/Light touch:          Left medial foot (L-4): normal          Left dorsal foot (L-5): normal          Left lateral foot (S-1): normal          Right medial foot (L-4): normal          Right dorsal foot (L-5): normal          Right lateral foot (S-1): normal       Sensory-Monofilament:          Left foot: normal          Right foot: normal       Inspection:          Left foot: normal          Right foot: normal       Nails:          Left foot: normal          Right foot: normal   Impression & Recommendations:  Problem # 1:  LEG CRAMPS (ICD-729.82) Not clearly due to crestor. ? if  due to poor control of hypthyroidism.   Problem # 2:  B12 DEFICIENCY (ICD-266.2)  Orders: Admin of Therapeutic Inj  intramuscular or subcutaneous (16109) Vit B12 1000 mcg (J3420)  Problem # 3:  AODM (ICD-250.00) Well controlled. Continue current medication. Stop sugary beverages.  Her updated medication list for this problem includes:    Byetta 5 Mcg Pen 5 Mcg/0.17ml Soln (Exenatide) .Marland Kitchen... 1 inj subcutaneously two times a day  Labs Reviewed: Creat: 0.9 (03/29/2010)     Last Eye  Exam: normal (01/10/2009) Reviewed HgBA1c results: 6.8 (03/29/2010)  6.6 (12/28/2009)  Problem # 4:  HYPERLIPIDEMIA (ICD-272.4) Inadeqaute control...get back on track with diet and exercise. Start fish oil  daily.  Her updated medication list for this problem includes:    Crestor 40 Mg Tabs (Rosuvastatin calcium) .Marland Kitchen... 1/2 tab by mouth daily  Labs Reviewed: SGOT: 19 (03/29/2010)   SGPT: 14 (03/29/2010)  Lipid Goals: Chol Goal: 200 (10/05/2008)   HDL Goal: 40 (10/05/2008)   LDL Goal: 100 (10/05/2008)   TG Goal: 150 (10/05/2008)  10 Yr Risk Heart Disease: 6 % Prior 10 Yr Risk Heart Disease: 15 % (10/20/2009)   HDL:36.10 (03/29/2010), 33.00 (12/28/2009)  LDL:142 (12/28/2009), 109 (07/14/2009)  Chol:205 (03/29/2010), 198 (12/28/2009)  Trig:226.0 (03/29/2010), 115.0 (12/28/2009)  Problem # 5:  HYPOTHYROIDISM (ICD-244.9)  poor control...adjust dose and recheck in 4-6 weeks.  Her updated medication list for this problem includes:    Levothyroxine Sodium 150 Mcg Tabs (Levothyroxine sodium) .Marland Kitchen... Take 1 tablet by mouth once a day  Labs Reviewed: TSH: 15.44 (03/29/2010)    HgBA1c: 6.8 (03/29/2010) Chol: 205 (03/29/2010)   HDL: 36.10 (03/29/2010)   LDL: 142 (12/28/2009)   TG: 226.0 (03/29/2010)  Complete Medication List: 1)  Levothyroxine Sodium 150 Mcg Tabs (Levothyroxine sodium) .... Take 1 tablet by mouth once a day 2)  Sertraline Hcl 50 Mg Tabs (Sertraline hcl) .Marland Kitchen.. 1 tab by mouth daily 3)  Crestor 40  Mg Tabs (Rosuvastatin calcium) .... 1/2 tab by mouth daily 4)  Test Strips For One Touch Ultra  .... Check blood sugar once daily dx 790.29 5)  Onetouch Ultrasoft Lancets Misc (Lancets) .... Check blood sugar daily dx 790.29 6)  Alprazolam 0.25 Mg Tabs (Alprazolam) .Marland Kitchen.. 1 tab by mouth daily as needed panic attacks 7)  Omeprazole 40 Mg Cpdr (Omeprazole) .... Take 1 tablet by mouth once a day 8)  Promethazine Hcl 25 Mg Tabs (Promethazine hcl) .... As needed 9)  Sumatriptan Succinate 100 Mg Tabs (Sumatriptan succinate) .Marland Kitchen.. 1 tab by mouth x 1, if headache not gone repeat in 2 hours 10)  Amitriptyline Hcl 25 Mg Tabs (Amitriptyline hcl) .Marland Kitchen.. 1 tab by mouth at bedtime 11)  Byetta 5 Mcg Pen 5 Mcg/0.25ml Soln (Exenatide) .Marland Kitchen.. 1 inj subcutaneously two times a day 12)  One Touch Select Test Strips  .... Check blood sugars daily dx 250.00 13)  Pens For Byetta Needles..pt Choice  .Marland Kitchen.. 1 inj two times a day 14)  Vitamin D (ergocalciferol) 50000 Unit Caps (Ergocalciferol) .Marland Kitchen.. 1 cap by mouth weekly x 12 weeks  Lipid Assessment/Plan:      Based on NCEP/ATP III, the patient's risk factor category is "history of diabetes".  The patient's lipid goals are as follows: Total cholesterol goal is 200; LDL cholesterol goal is 100; HDL cholesterol goal is 40; Triglyceride goal is 150.  Her LDL cholesterol goal has not been met.  Secondary causes for hyperlipidemia have been ruled out.  She has been counseled on adjunctive measures for lowering her cholesterol and has been provided with dietary instructions.    Patient Instructions: 1)  Fish oil 2000 mg daily. 2)   Contninue  vit D prescription. 3)  Increase levothyroxine to 150 micrograms daily. 4)   Return in 4-6 weeks for TSH Dx 244.9 5)  Increase diet changes and increase exercsie. 6)  Stop snapple and mountain Dew. 7)  Please schedule a follow-up appointment in 3 months .  8)   Lipids, CMET, A1C Dx 250.00, Dx 272.0 prior  to appt. 9)  Call if panic attacks  continue to happen more frequently..so we increase sertraline. Prescriptions: ALPRAZOLAM 0.25 MG TABS (ALPRAZOLAM) 1 tab by mouth daily as needed panic attacks  #30 x 0   Entered and Authorized by:   Kerby Nora MD   Signed by:   Kerby Nora MD on 04/04/2010   Method used:   Print then Give to Patient   RxID:   [id number] BYETTA 5 MCG PEN 5 MCG/0.02ML SOLN (EXENATIDE) 1 inj Subcutaneously two times a day  #1 box x 11   Entered and Authorized by:   Kerby Nora MD   Signed by:   Kerby Nora MD on 04/04/2010   Method used:   Electronically to        [facility] [phone]* (retail)       [address]       [place], [place]  [address]       Ph: [phone] or [phone]       Fax: [phone]   RxID:   [id number] LEVOTHYROXINE SODIUM 150 MCG TABS (LEVOTHYROXINE SODIUM) Take 1 tablet by mouth once a day  #30 x 11   Entered and Authorized by:   Kerby Nora MD   Signed by:   Kerby Nora MD on 04/04/2010   Method used:   Electronically to        [facility] [phone]* (retail)       [address]       [place], [place]  [address]       Ph: [phone] or [phone]       Fax: [phone]   RxID:   [id number] VITAMIN D (ERGOCALCIFEROL) 50000 UNIT CAPS (ERGOCALCIFEROL) 1 cap by mouth weekly x 12 weeks  #12 x 0   Entered and Authorized by:   Kerby Nora MD   Signed by:   Kerby Nora MD on 04/04/2010   Method used:   Electronically to        [facility] [phone]* (retail)       [address]       [place], [place]  [address]       Ph: [phone] or [phone]       Fax: [phone]   RxID:   [id number]   Current Allergies (reviewed today): ! CODEINE   Medication Administration  Injection # 1:    Medication: Vit B12 1000 mcg    Diagnosis: B12 DEFICIENCY (ICD-266.2)    Route: IM    Site: R deltoid    Exp Date: 08/11/2011    Lot #: [id number]    Mfr:  [employer]    Comments: 1000 micrograms IM x 1     Patient tolerated injection without complications    Given by: Benny Lennert CMA Duncan Dull) (April 04, 2010 12:39 PM)  Orders Added: 1)  Admin of Therapeutic Inj  intramuscular or subcutaneous [96372] 2)  Vit B12 1000 mcg [J3420] 3)  Est. Patient Level IV [35361]    Medication Administration  Injection # 1:    Medication: Vit B12 1000 mcg    Diagnosis: B12 DEFICIENCY (ICD-266.2)    Route: IM    Site: R deltoid    Exp Date: 08/11/2011    Lot #: [id number]    Mfr: [employer]ent  Comments: 1000 micrograms IM x 1     Patient tolerated injection without complications    Given by: Benny Lennert CMA Duncan Dull) (April 04, 2010 12:39 PM)  Orders Added: 1)  Admin of Therapeutic Inj  intramuscular or subcutaneous [96372] 2)  Vit B12 1000 mcg [J3420] 3)  Est. Patient Level IV [78295]

## 2011-01-11 NOTE — Assessment & Plan Note (Signed)
Summary: FOLLOW-UP/JRR   Vital Signs:  Patient profile:   50 year old female Height:      60 inches Weight:      163.75 pounds BMI:     32.10 O2 Sat:      98 % Temp:     98.1 degrees F oral Pulse rate:   79 / minute Pulse rhythm:   regular BP sitting:   110 / 80  (left arm) Cuff size:   regular  Vitals Entered By: Benny Lennert CMA Duncan Dull) (December 15, 2010 10:01 AM)  History of Present Illness: Chief complaint follow up   DM, worsened control   Gained weight back  since last OV.  Had to stop byetta due to nausea.  Has cut back on Mtn Dew. Checking blood sugar once a day and as needed if feeling ill.  In past few days she has been having some dizziness.Marland Kitchen FBS 110. Felt like room spinning. No URI symptoms, occ sneezing.    High cholesterol..poor control on crestor 20 mg and fish oil... Will add welchol.   Changed to skim milk.  Hypothyroid.Marland KitchenHas been on new  200 micrograms daily for 3-4 days.   Problems Prior to Update: 1)  Hematochezia  (ICD-578.1) 2)  B12 Deficiency  (ICD-266.2) 3)  Leg Cramps  (ICD-729.82) 4)  Other Malaise and Fatigue  (ICD-780.79) 5)  Peripheral Neuropathy  (ICD-356.9) 6)  Gerd  (ICD-530.81) 7)  Well Woman  (ICD-V70.0) 8)  Other Screening Mammogram  (ICD-V76.12) 9)  Anxiety Disorder, Generalized  (ICD-300.02) 10)  Aodm  (ICD-250.00) 11)  Migraine, Common  (ICD-346.10) 12)  Allergic Rhinitis  (ICD-477.9) 13)  Hyperlipidemia  (ICD-272.4) 14)  Hypothyroidism  (ICD-244.9) 15)  Irritable Bowel Syndrome  (ICD-564.1)  Current Medications (verified): 1)  Levothyroxine Sodium 200 Mcg Tabs (Levothyroxine Sodium) .... Take 1 Tablet By Mouth Once A Day 2)  Sertraline Hcl 50 Mg Tabs (Sertraline Hcl) .Marland Kitchen.. 1 Tab By Mouth Daily 3)  Crestor 40 Mg Tabs (Rosuvastatin Calcium) .... 1/2 Tab By Mouth Daily 4)  Test Strips For One Touch Ultra .... Check Blood Sugar Once Daily Dx 790.29 5)  Onetouch Ultrasoft Lancets   Misc (Lancets) .... Check Blood Sugar Daily  Dx 790.29 6)  Alprazolam 0.25 Mg Tabs (Alprazolam) .Marland Kitchen.. 1 Tab By Mouth Daily As Needed Panic Attacks 7)  Omeprazole 40 Mg Cpdr (Omeprazole) .... Take 1 Tablet By Mouth Once A Day 8)  Promethazine Hcl 25 Mg Tabs (Promethazine Hcl) .... As Needed 9)  Sumatriptan Succinate 100 Mg Tabs (Sumatriptan Succinate) .Marland Kitchen.. 1 Tab By Mouth X 1, If Headache Not Gone Repeat in 2 Hours 10)  Amitriptyline Hcl 25 Mg Tabs (Amitriptyline Hcl) .Marland Kitchen.. 1 Tab By Mouth At Bedtime 11)  Byetta 5 Mcg Pen 5 Mcg/0.15ml Soln (Exenatide) .Marland Kitchen.. 1 Inj Subcutaneously Two Times A Day 12)  One Touch Select Test Strips .... Check Blood Sugars Daily Dx 250.00 13)  Pens For Byetta Needles..pt Choice .Marland Kitchen.. 1 Inj Two Times A Day 14)  Vitamin D (Ergocalciferol) 50000 Unit Caps (Ergocalciferol) .Marland Kitchen.. 1 Cap By Mouth Weekly X 12 Weeks 15)  Dicyclomine Hcl 10 Mg Caps (Dicyclomine Hcl) .Marland Kitchen.. 1-2 Cap By Mouth Three Times A Day As Needed Abdominal Cramping/spasm  Allergies: 1)  ! Codeine  Past History:  Past medical, surgical, family and social histories (including risk factors) reviewed, and no changes noted (except as noted below).  Past Medical History: Reviewed history from 03/08/2009 and no changes required. Hypothyroidism Hyperlipidemia diabetes - diet controlled  Allergic rhinitis  Past Surgical History: Reviewed history from 03/08/2009 and no changes required. 1989 C-section BTL 1992 colonoscopy/EGD 2009: nml  Family History: Reviewed history from 04/29/2008 and no changes required. father: no contact mother: died age 85 CHF,pericarditis unclear cause, was on diet pills MGF: massive MI age 26 MGM: DM, alzheimer's, CAD  Social History: Reviewed history from 04/29/2008 and no changes required. 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 CV:  Denies chest  pain or discomfort. Resp:  Denies shortness of breath. GI:  Denies abdominal pain. GU:  Denies dysuria.  Physical Exam  General:  overweight appearing female in NAD Mouth:  Oral mucosa and oropharynx without lesions or exudates.  Teeth in good repair. Neck:  no carotid bruit or thyromegaly no cervical or supraclavicular lymphadenopathy  Lungs:  Normal respiratory effort, chest expands symmetrically. Lungs are clear to auscultation, no crackles or wheezes. Heart:  Normal rate and regular rhythm. S1 and S2 normal without gallop, murmur, click, rub or other extra sounds. Pulses:  R and L posterior tibial pulses are full and equal bilaterally  Extremities:  no edema  Diabetes Management Exam:    Foot Exam (with socks and/or shoes not present):       Sensory-Pinprick/Light touch:          Left medial foot (L-4): normal          Left dorsal foot (L-5): normal          Left lateral foot (S-1): normal          Right medial foot (L-4): normal          Right dorsal foot (L-5): normal          Right lateral foot (S-1): normal       Sensory-Monofilament:          Left foot: diminished          Right foot: diminished       Inspection:          Left foot: normal          Right foot: normal       Nails:          Left foot: normal          Right foot: normal   Impression & Recommendations:  Problem # 1:  AODM (ICD-250.00) Assessment Deteriorated Unable to tolerate Byetta. Will start metformin. Recehck in 3 months..Encouraged exercise, weight loss, healthy eating habits.  The following medications were removed from the medication list:    Byetta 5 Mcg Pen 5 Mcg/0.43ml Soln (Exenatide) .Marland Kitchen... 1 inj subcutaneously two times a day Her updated medication list for this problem includes:    Metformin Hcl 500 Mg Xr24h-tab (Metformin hcl) .Marland Kitchen... 1 tab by mouth daily  Problem # 2:  HYPOTHYROIDISM (ICD-244.9) Assessment: Deteriorated Inadequate control.. may be causing leg cramps dizziness, fatigue  etc. Only on new dose for few days. Follow closely.  Her updated medication list for this problem includes:    Levothyroxine Sodium 200 Mcg Tabs (Levothyroxine sodium) .Marland Kitchen... Take 1 tablet by mouth once a day  Problem # 3:  HYPERLIPIDEMIA (ICD-272.4) Assessment: Deteriorated Re-enforced importance of lisfetyle change. Encouraged exercise, weight loss, healthy eating habits.  Continue crestor and add welchol. Recheck fasting LIPIDS, AST, ALT  in 3 months Dx 272.0    Her updated medication  list for this problem includes:    Crestor 40 Mg Tabs (Rosuvastatin calcium) .Marland Kitchen... 1/2 tab by mouth daily    Welchol 625 Mg Tabs (Colesevelam hcl) .Marland KitchenMarland KitchenMarland KitchenMarland Kitchen 5 tab by mouth daily  Labs Reviewed: SGOT: 17 (08/02/2010)   SGPT: 12 (08/02/2010)  Lipid Goals: Chol Goal: 200 (10/05/2008)   HDL Goal: 40 (10/05/2008)   LDL Goal: 100 (10/05/2008)   TG Goal: 150 (10/05/2008)  Prior 10 Yr Risk Heart Disease: 6 % (04/04/2010)   HDL:32.90 (12/12/2010), 38.20 (08/02/2010)  LDL:142 (12/28/2009), 109 (07/14/2009)  Chol:212 (12/12/2010), 214 (08/02/2010)  Trig:105.0 (12/12/2010), 108.0 (08/02/2010)  Problem # 4:  INTERMITTENT VERTIGO (ICD-780.4) ? due to inner ear vs thyroid issues. Neg Gilberto Better on exam. Info given on desensitization exercsies. Follow up as scheduled or sooner if symptoms worsening.  Her updated medication list for this problem includes:    Promethazine Hcl 25 Mg Tabs (Promethazine hcl) .Marland Kitchen... As needed  Complete Medication List: 1)  Levothyroxine Sodium 200 Mcg Tabs (Levothyroxine sodium) .... Take 1 tablet by mouth once a day 2)  Sertraline Hcl 50 Mg Tabs (Sertraline hcl) .Marland Kitchen.. 1 tab by mouth daily 3)  Crestor 40 Mg Tabs (Rosuvastatin calcium) .... 1/2 tab by mouth daily 4)  Test Strips For One Touch Ultra  .... Check blood sugar once daily dx 790.29 5)  Onetouch Ultrasoft Lancets Misc (Lancets) .... Check blood sugar daily dx 790.29 6)  Alprazolam 0.25 Mg Tabs (Alprazolam) .Marland Kitchen.. 1 tab by mouth daily as  needed panic attacks 7)  Omeprazole 40 Mg Cpdr (Omeprazole) .... Take 1 tablet by mouth once a day 8)  Promethazine Hcl 25 Mg Tabs (Promethazine hcl) .... As needed 9)  Sumatriptan Succinate 100 Mg Tabs (Sumatriptan succinate) .Marland Kitchen.. 1 tab by mouth x 1, if headache not gone repeat in 2 hours 10)  Amitriptyline Hcl 25 Mg Tabs (Amitriptyline hcl) .Marland Kitchen.. 1 tab by mouth at bedtime 11)  One Touch Select Test Strips  .... Check blood sugars daily dx 250.00 12)  Pens For Byetta Needles..pt Choice  .Marland Kitchen.. 1 inj two times a day 13)  Vitamin D (ergocalciferol) 50000 Unit Caps (Ergocalciferol) .Marland Kitchen.. 1 cap by mouth weekly x 12 weeks 14)  Dicyclomine Hcl 10 Mg Caps (Dicyclomine hcl) .Marland Kitchen.. 1-2 cap by mouth three times a day as needed abdominal cramping/spasm 15)  Metformin Hcl 500 Mg Xr24h-tab (Metformin hcl) .Marland Kitchen.. 1 tab by mouth daily 16)  Welchol 625 Mg Tabs (Colesevelam hcl) .... 5 tab by mouth daily  Patient Instructions: 1)  Exercsie and weight loss. 2)  Low carb diet. 3)   Start metformin. 4)  Start welchol. 5)  Stay on new dose thyroid med. 6)  Return for TSH in 4 weeks. Dx 244.9  7)  Please schedule a follow-up appointment in 3 months .  8)  BMP prior to visit, ICD-9:  250.00 9)  Hepatic Panel prior to visit ICD-9:  10)  HgBA1c prior to visit  ICD-9:  11)  Urine Microalbumin prior to visit ICD-9 :  Prescriptions: ALPRAZOLAM 0.25 MG TABS (ALPRAZOLAM) 1 tab by mouth daily as needed panic attacks  #30 x 0   Entered and Authorized by:   Kerby Nora MD   Signed by:   Kerby Nora MD on 12/15/2010   Method used:   Print then Give to Patient   RxID:   [id number] AMITRIPTYLINE HCL 25 MG TABS (AMITRIPTYLINE HCL) 1 tab by mouth at bedtime  #30 x 5   Entered and Authorized by:  Kerby Nora MD   Signed by:   Kerby Nora MD on 12/15/2010   Method used:   Electronically to        [facility]  [address] [id number] (retail)       [address]       [place], [place]  [address]       Ph:  [phone]       Fax: [phone]   RxID:   [id number] WELCHOL 625 MG TABS (COLESEVELAM HCL) 5 tab by mouth daily  #150 x 11   Entered and Authorized by:   Kerby Nora MD   Signed by:   Kerby Nora MD on 12/15/2010   Method used:   Electronically to        [facility]  [address] [phone]* (retail)       [address]       [place], [place]  [address]       Ph: [phone]       Fax: [phone]   RxID:   [id number] METFORMIN HCL 500 MG XR24H-TAB (METFORMIN HCL) 1 tab by mouth daily  #30 x 11   Entered and Authorized by:   Kerby Nora MD   Signed by:   Kerby Nora MD on 12/15/2010   Method used:   Electronically to        [facility]  [address] [phone]* (retail)       [address]       [place], [place]  [address]       Ph: [phone]       Fax: [phone]   RxID:   [id number]    Orders Added: 1)  Est. Patient Level IV [62694]    Current Allergies (reviewed today): ! CODEINE

## 2011-01-15 ENCOUNTER — Emergency Department (HOSPITAL_COMMUNITY): Payer: 59

## 2011-01-15 ENCOUNTER — Emergency Department (HOSPITAL_COMMUNITY)
Admission: EM | Admit: 2011-01-15 | Discharge: 2011-01-15 | Disposition: A | Discharge status: Home / Self Care | Payer: 59 | Attending: Emergency Medicine | Admitting: Emergency Medicine

## 2011-01-15 DIAGNOSIS — R0789 Other chest pain: Secondary | ICD-10-CM | POA: Insufficient documentation

## 2011-01-15 DIAGNOSIS — E039 Hypothyroidism, unspecified: Secondary | ICD-10-CM | POA: Insufficient documentation

## 2011-01-15 DIAGNOSIS — G43909 Migraine, unspecified, not intractable, without status migrainosus: Secondary | ICD-10-CM | POA: Insufficient documentation

## 2011-01-15 DIAGNOSIS — E785 Hyperlipidemia, unspecified: Secondary | ICD-10-CM | POA: Insufficient documentation

## 2011-01-15 DIAGNOSIS — E119 Type 2 diabetes mellitus without complications: Secondary | ICD-10-CM | POA: Insufficient documentation

## 2011-01-15 DIAGNOSIS — M79609 Pain in unspecified limb: Secondary | ICD-10-CM | POA: Insufficient documentation

## 2011-01-15 LAB — URINALYSIS, ROUTINE W REFLEX MICROSCOPIC
Hgb urine dipstick: NEGATIVE
Leukocytes, UA: NEGATIVE
Nitrite: NEGATIVE
Protein, ur: 100 mg/dL — AB
Specific Gravity, Urine: 1.026 (ref 1.005–1.030)
Urobilinogen, UA: 0.2 mg/dL (ref 0.0–1.0)

## 2011-01-15 LAB — POCT CARDIAC MARKERS
CKMB, poc: <1 ng/mL — ABNORMAL LOW (ref 1.0–8.0)
CKMB, poc: <1 ng/mL — ABNORMAL LOW (ref 1.0–8.0)
Myoglobin, poc: 66.1 ng/mL (ref 12–200)
Myoglobin, poc: 93 ng/mL (ref 12–200)
Troponin i, poc: <0.05 ng/mL (ref 0.00–0.09)

## 2011-01-15 LAB — URINE MICROSCOPIC-ADD ON

## 2011-01-15 LAB — POCT I-STAT, CHEM 8
BUN: 13 mg/dL (ref 6–23)
Creatinine, Ser: 0.8 mg/dL (ref 0.4–1.2)
Potassium: 4.3 mEq/L (ref 3.5–5.1)
Sodium: 138 mEq/L (ref 135–145)

## 2011-01-15 LAB — PREGNANCY, URINE: Preg Test, Ur: NEGATIVE

## 2011-02-01 DIAGNOSIS — M810 Age-related osteoporosis without current pathological fracture: Secondary | ICD-10-CM | POA: Insufficient documentation

## 2011-02-01 DIAGNOSIS — I10 Essential (primary) hypertension: Secondary | ICD-10-CM | POA: Insufficient documentation

## 2011-02-02 ENCOUNTER — Encounter: Payer: Self-pay | Admitting: Cardiology

## 2011-02-02 ENCOUNTER — Encounter (INDEPENDENT_AMBULATORY_CARE_PROVIDER_SITE_OTHER): Payer: 59 | Admitting: Cardiology

## 2011-02-02 DIAGNOSIS — R0789 Other chest pain: Secondary | ICD-10-CM | POA: Insufficient documentation

## 2011-02-02 DIAGNOSIS — R072 Precordial pain: Secondary | ICD-10-CM

## 2011-02-02 DIAGNOSIS — R0602 Shortness of breath: Secondary | ICD-10-CM | POA: Insufficient documentation

## 2011-02-06 NOTE — Assessment & Plan Note (Signed)
Summary: eph/chest pains seen ed notes ion e-chart-mb pt rsa ppt req s...    Primary Provider:  Kerby Nora MD   History of Present Illness: 50 year old female with no prior cardiac history or evaluation of chest pain. Seen in the emergency room on January 15, 2011 with chest pain. Cardiac markers negative. Patient states that for the past several months she has had intermittent chest pain. He can be on the right or left side. It is described as a sharp pain and a pressure. It can radiate to the back. There is associated shortness of breath, nausea and diaphoresis by her report. They can occur at rest or with exertion. It can increase with lying on her left side. Because of the above we were asked to further evaluate. She also has some dyspnea on exertion but no orthopnea or PND. There is no pedal edema.  Problems Prior to Update: 1)  Hypertension  (ICD-401.9) 2)  Osteoporosis  (ICD-733.00) 3)  Glaucoma  (ICD-365.9) 4)  Intermittent Vertigo  (ICD-780.4) 5)  Hematochezia  (ICD-578.1) 6)  B12 Deficiency  (ICD-266.2) 7)  Leg Cramps  (ICD-729.82) 8)  Other Malaise and Fatigue  (ICD-780.79) 9)  Peripheral Neuropathy  (ICD-356.9) 10)  Gerd  (ICD-530.81) 11)  Well Woman  (ICD-V70.0) 12)  Other Screening Mammogram  (ICD-V76.12) 13)  Anxiety Disorder, Generalized  (ICD-300.02) 14)  Aodm  (ICD-250.00) 15)  Migraine, Common  (ICD-346.10) 16)  Allergic Rhinitis  (ICD-477.9) 17)  Hyperlipidemia  (ICD-272.4) 18)  Hypothyroidism  (ICD-244.9) 19)  Irritable Bowel Syndrome  (ICD-564.1)  Current Medications (verified): 1)  Levothyroxine Sodium 200 Mcg Tabs (Levothyroxine Sodium) .... Take 1 Tablet By Mouth Once A Day 2)  Sertraline Hcl 50 Mg Tabs (Sertraline Hcl) .Marland Kitchen.. 1 Tab By Mouth Daily 3)  Crestor 40 Mg Tabs (Rosuvastatin Calcium) .... 1/2 Tab By Mouth Daily 4)  Test Strips For One Touch Ultra .... Check Blood Sugar Once Daily Dx 790.29 5)  Onetouch Ultrasoft Lancets   Misc (Lancets) .... Check  Blood Sugar Daily Dx 790.29 6)  Alprazolam 0.25 Mg Tabs (Alprazolam) .Marland Kitchen.. 1 Tab By Mouth Daily As Needed Panic Attacks 7)  Promethazine Hcl 25 Mg Tabs (Promethazine Hcl) .... As Needed 8)  Amitriptyline Hcl 25 Mg Tabs (Amitriptyline Hcl) .Marland Kitchen.. 1 Tab By Mouth At Bedtime 9)  One Touch Select Test Strips .... Check Blood Sugars Daily Dx 250.00 10)  Pens For Byetta Needles..pt Choice .Marland Kitchen.. 1 Inj Two Times A Day 11)  Vitamin D (Ergocalciferol) 50000 Unit Caps (Ergocalciferol) .Marland Kitchen.. 1 Cap By Mouth Weekly X 12 Weeks 12)  Dicyclomine Hcl 10 Mg Caps (Dicyclomine Hcl) .Marland Kitchen.. 1-2 Cap By Mouth Three Times A Day As Needed Abdominal Cramping/spasm 13)  Metformin Hcl 500 Mg Xr24h-Tab (Metformin Hcl) .Marland Kitchen.. 1 Tab By Mouth Daily 14)  Welchol 625 Mg Tabs (Colesevelam Hcl) .... 5 Tab By Mouth Daily  Allergies: 1)  ! Codeine  Past History:  Past Medical History: OSTEOPOROSIS  GLAUCOMA INTERMITTENT VERTIGO  B12 DEFICIENCY PERIPHERAL NEUROPATHY  GERD  ANXIETY DISORDER, GENERALIZED AODM  MIGRAINE, COMMON HYPERLIPIDEMIA  HYPOTHYROIDISM  IRRITABLE BOWEL SYNDROME   Past Surgical History: Reviewed history from 03/08/2009 and no changes required. 1989 C-section BTL 1992 colonoscopy/EGD 2009: nml  Family History: Reviewed history from 04/29/2008 and no changes required. father: no contact mother: died age 11 CHF, pericarditis unclear cause, was on diet pills MGF: MI age 41 MGM: DM, alzheimer's, CAD  Social History: Reviewed history from 04/29/2008 and no changes required. 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 Remaining systems are negative.   Vital  Signs:  Patient profile:   50 year old female Height:      60 inches Weight:      161.75 pounds Pulse rate:   79 / minute Resp:     14 per minute BP sitting:   102 / 70  (left arm)  Vitals Entered By: Ellender Hose RN (February 02, 2011 8:56 AM)  Physical Exam  General:  Well developed/well nourished in NAD Skin warm/dry Patient not depressed No peripheral clubbing Back-normal HEENT-normal/normal eyelids Neck supple/normal carotid upstroke bilaterally; no bruits; no JVD; no thyromegaly chest - CTA/ normal expansion CV - RRR/normal S1 and S2; no murmurs, rubs or gallops;  PMI nondisplaced Abdomen -NT/ND, no HSM, no mass, + bowel sounds, no bruit 2+ femoral pulses, no bruits Ext-no edema, chords, 2+ DP Neuro-grossly nonfocal     EKG  Procedure date:  02/02/2011  Findings:      Normal sinus rhythm, normal axis, nonspecific ST changes.  Impression & Recommendations:  Problem # 1:  CHEST PAIN (ICD-786.50) Symptoms atypical. Multiple risk factors. Schedule stress echocardiogram to exclude ischemia and to quantify LV function. Orders: EKG w/ Interpretation (93000) Stress Echo (Stress Echo)  Problem # 2:  DYSPNEA (ICD-786.05) As per #1. Orders: EKG w/ Interpretation (93000) Stress Echo (Stress Echo)  Problem # 3:  AODM (ICD-250.00)  Her updated medication list for this problem includes:    Metformin Hcl 500 Mg Xr24h-tab (Metformin hcl) .Marland Kitchen... 1 tab by mouth daily  Problem # 4:  HYPERLIPIDEMIA (ICD-272.4) Continue present medications. Management per primary care. Her updated medication list for this problem includes:    Crestor 40 Mg Tabs (Rosuvastatin calcium) .Marland Kitchen... 1/2 tab by mouth daily    Welchol 625 Mg Tabs (Colesevelam hcl) .Marland KitchenMarland KitchenMarland KitchenMarland Kitchen 5 tab by mouth daily  Problem # 5:  HYPOTHYROIDISM (ICD-244.9)  Her updated medication list for this problem includes:    Levothyroxine Sodium 200 Mcg Tabs (Levothyroxine sodium) .Marland Kitchen... Take 1 tablet by mouth once a  day  Problem # 6:  IRRITABLE BOWEL SYNDROME (ICD-564.1)  Patient Instructions: 1)  Your physician recommends that you schedule a follow-up appointment as needed. 2)  Your physician recommends that you continue on your current medications as directed. Please refer to the Current Medication list given to you today. 3)  Your physician has requested that you have a stress echocardiogram. For further information please visit [email or web address]  Please follow instruction sheet as given.

## 2011-02-07 ENCOUNTER — Encounter: Payer: Self-pay | Admitting: Family Medicine

## 2011-02-15 ENCOUNTER — Telehealth (INDEPENDENT_AMBULATORY_CARE_PROVIDER_SITE_OTHER): Payer: Self-pay | Admitting: *Deleted

## 2011-02-16 ENCOUNTER — Encounter: Payer: Self-pay | Admitting: Cardiology

## 2011-02-16 ENCOUNTER — Ambulatory Visit (HOSPITAL_COMMUNITY): Payer: 59 | Attending: Cardiology

## 2011-02-16 DIAGNOSIS — R079 Chest pain, unspecified: Secondary | ICD-10-CM | POA: Insufficient documentation

## 2011-02-16 DIAGNOSIS — R9389 Abnormal findings on diagnostic imaging of other specified body structures: Secondary | ICD-10-CM | POA: Insufficient documentation

## 2011-02-16 DIAGNOSIS — R072 Precordial pain: Secondary | ICD-10-CM

## 2011-02-16 DIAGNOSIS — F17200 Nicotine dependence, unspecified, uncomplicated: Secondary | ICD-10-CM | POA: Insufficient documentation

## 2011-02-16 DIAGNOSIS — I10 Essential (primary) hypertension: Secondary | ICD-10-CM | POA: Insufficient documentation

## 2011-02-16 DIAGNOSIS — E785 Hyperlipidemia, unspecified: Secondary | ICD-10-CM | POA: Insufficient documentation

## 2011-02-16 DIAGNOSIS — G609 Hereditary and idiopathic neuropathy, unspecified: Secondary | ICD-10-CM | POA: Insufficient documentation

## 2011-02-19 ENCOUNTER — Other Ambulatory Visit: Payer: Self-pay | Admitting: Cardiology

## 2011-02-19 ENCOUNTER — Encounter: Payer: Self-pay | Admitting: *Deleted

## 2011-02-19 ENCOUNTER — Ambulatory Visit (INDEPENDENT_AMBULATORY_CARE_PROVIDER_SITE_OTHER)
Admission: RE | Admit: 2011-02-19 | Discharge: 2011-02-19 | Disposition: A | Discharge status: Home / Self Care | Payer: 59 | Source: Ambulatory Visit | Attending: Cardiology | Admitting: Cardiology

## 2011-02-19 ENCOUNTER — Ambulatory Visit (INDEPENDENT_AMBULATORY_CARE_PROVIDER_SITE_OTHER): Payer: 59 | Admitting: Cardiology

## 2011-02-19 ENCOUNTER — Encounter: Payer: Self-pay | Admitting: Cardiology

## 2011-02-19 DIAGNOSIS — R0602 Shortness of breath: Secondary | ICD-10-CM

## 2011-02-19 DIAGNOSIS — R072 Precordial pain: Secondary | ICD-10-CM

## 2011-02-19 DIAGNOSIS — R9430 Abnormal result of cardiovascular function study, unspecified: Secondary | ICD-10-CM

## 2011-02-19 DIAGNOSIS — I10 Essential (primary) hypertension: Secondary | ICD-10-CM

## 2011-02-19 LAB — CBC WITH DIFFERENTIAL/PLATELET
Eosinophils Absolute: 0.2 10*3/uL (ref 0.0–0.7)
Eosinophils Relative: 1.7 % (ref 0.0–5.0)
MCHC: 33.5 g/dL (ref 30.0–36.0)
MCV: 79.7 fl (ref 78.0–100.0)
Monocytes Absolute: 0.6 10*3/uL (ref 0.1–1.0)
Neutrophils Relative %: 60.7 % (ref 43.0–77.0)
Platelets: 322 10*3/uL (ref 150.0–400.0)
WBC: 10.8 10*3/uL — ABNORMAL HIGH (ref 4.5–10.5)

## 2011-02-19 LAB — BASIC METABOLIC PANEL
BUN: 15 mg/dL (ref 6–23)
Calcium: 9 mg/dL (ref 8.4–10.5)
Chloride: 102 mEq/L (ref 96–112)
Creatinine, Ser: 0.8 mg/dL (ref 0.4–1.2)

## 2011-02-19 LAB — PROTIME-INR
INR: 1.1 ratio — ABNORMAL HIGH (ref 0.8–1.0)
Prothrombin Time: 12.5 s — ABNORMAL HIGH (ref 10.2–12.4)

## 2011-02-20 ENCOUNTER — Inpatient Hospital Stay (HOSPITAL_BASED_OUTPATIENT_CLINIC_OR_DEPARTMENT_OTHER)
Admission: RE | Admit: 2011-02-20 | Discharge: 2011-02-20 | Disposition: A | Discharge status: Home / Self Care | Payer: 59 | Source: Ambulatory Visit | Attending: Cardiology | Admitting: Cardiology

## 2011-02-20 ENCOUNTER — Ambulatory Visit (HOSPITAL_COMMUNITY): Admission: RE | Admit: 2011-02-20 | Payer: 59 | Source: Ambulatory Visit | Admitting: Cardiology

## 2011-02-20 DIAGNOSIS — R0602 Shortness of breath: Secondary | ICD-10-CM | POA: Insufficient documentation

## 2011-02-20 DIAGNOSIS — F17200 Nicotine dependence, unspecified, uncomplicated: Secondary | ICD-10-CM | POA: Insufficient documentation

## 2011-02-20 DIAGNOSIS — R0789 Other chest pain: Secondary | ICD-10-CM | POA: Insufficient documentation

## 2011-02-20 DIAGNOSIS — R079 Chest pain, unspecified: Secondary | ICD-10-CM

## 2011-02-20 NOTE — Progress Notes (Signed)
Summary: stress echo appt  Phone Note Outgoing Call Call back at Home Phone [phone]   Action Taken: Phone Call Completed Summary of Call: Patient aware of instructions ref: stress echo appt, per Allen Kell.

## 2011-02-22 LAB — URINE MICROSCOPIC-ADD ON

## 2011-02-22 LAB — BASIC METABOLIC PANEL
BUN: 13 mg/dL (ref 6–23)
Creatinine, Ser: 0.88 mg/dL (ref 0.4–1.2)
GFR calc non Af Amer: >60 mL/min (ref >60)

## 2011-02-22 LAB — URINALYSIS, ROUTINE W REFLEX MICROSCOPIC
Bilirubin Urine: NEGATIVE
Ketones, ur: 40 mg/dL — AB
Nitrite: NEGATIVE
Urobilinogen, UA: 0.2 mg/dL (ref 0.0–1.0)

## 2011-02-22 LAB — GLUCOSE, CAPILLARY
Glucose-Capillary: 159 mg/dL — ABNORMAL HIGH (ref 70–99)
Glucose-Capillary: 278 mg/dL — ABNORMAL HIGH (ref 70–99)

## 2011-02-27 ENCOUNTER — Encounter: Payer: 59 | Admitting: Cardiology

## 2011-02-27 LAB — DIFFERENTIAL
Basophils Absolute: 0 10*3/uL (ref 0.0–0.1)
Basophils Relative: 0 % (ref 0–1)
Eosinophils Absolute: 0 10*3/uL (ref 0.0–0.7)
Eosinophils Relative: 0 % (ref 0–5)
Monocytes Absolute: 0.5 10*3/uL (ref 0.1–1.0)
Neutro Abs: 16.4 10*3/uL — ABNORMAL HIGH (ref 1.7–7.7)

## 2011-02-27 LAB — COMPREHENSIVE METABOLIC PANEL
ALT: 20 U/L (ref 0–35)
Albumin: 3.9 g/dL (ref 3.5–5.2)
Alkaline Phosphatase: 73 U/L (ref 39–117)
BUN: 16 mg/dL (ref 6–23)
Chloride: 103 mEq/L (ref 96–112)
Potassium: 3.9 mEq/L (ref 3.5–5.1)
Total Bilirubin: 0.8 mg/dL (ref 0.3–1.2)

## 2011-02-27 LAB — URINALYSIS, ROUTINE W REFLEX MICROSCOPIC
Bilirubin Urine: NEGATIVE
Ketones, ur: >80 mg/dL — AB
Nitrite: NEGATIVE
Protein, ur: 100 mg/dL — AB
pH: 6 (ref 5.0–8.0)

## 2011-02-27 LAB — URINE MICROSCOPIC-ADD ON

## 2011-02-27 LAB — CBC
HCT: 42.5 % (ref 36.0–46.0)
Hemoglobin: 13.7 g/dL (ref 12.0–15.0)
Platelets: 358 10*3/uL (ref 150–400)
WBC: 18.3 10*3/uL — ABNORMAL HIGH (ref 4.0–10.5)

## 2011-02-27 LAB — POCT PREGNANCY, URINE: Preg Test, Ur: NEGATIVE

## 2011-02-27 NOTE — Assessment & Plan Note (Signed)
Summary: rov. appt is 9;30 a.m.  per ts on 3/9. o.k. per debra./ gd      Allergies Added:   Primary Provider:  Kerby Nora MD   History of Present Illness: 50 year old female I saw  in Feb 2012 for evaluation of chest pain. Seen in the emergency room on January 15, 2011 with chest pain. Cardiac markers negative. Stress echo performed 3/12 revealed hypokinesis of the basal and mid inferior wall and distal septum. Since her stress test, she continues to have occasional chest pain. She states the pain is in various locations on her chest. It has been continuous for 2 months. It increases with activities but persists with sitting still. It can increase with certain movements. She denies dyspnea.  Current Medications (verified): 1)  Levothyroxine Sodium 200 Mcg Tabs (Levothyroxine Sodium) .... Take 1 Tablet By Mouth Once A Day 2)  Sertraline Hcl 50 Mg Tabs (Sertraline Hcl) .Marland Kitchen.. 1 Tab By Mouth Daily 3)  Crestor 40 Mg Tabs (Rosuvastatin Calcium) .... 1/2 Tab By Mouth Daily 4)  Test Strips For One Touch Ultra .... Check Blood Sugar Once Daily Dx 790.29 5)  Onetouch Ultrasoft Lancets   Misc (Lancets) .... Check Blood Sugar Daily Dx 790.29 6)  Alprazolam 0.25 Mg Tabs (Alprazolam) .Marland Kitchen.. 1 Tab By Mouth Daily As Needed Panic Attacks 7)  Promethazine Hcl 25 Mg Tabs (Promethazine Hcl) .... As Needed 8)  Amitriptyline Hcl 25 Mg Tabs (Amitriptyline Hcl) .Marland Kitchen.. 1 Tab By Mouth At Bedtime 9)  One Touch Select Test Strips .... Check Blood Sugars Daily Dx 250.00 10)  Vitamin D (Ergocalciferol) 50000 Unit Caps (Ergocalciferol) .Marland Kitchen.. 1 Cap By Mouth Weekly X 12 Weeks 11)  Dicyclomine Hcl 10 Mg Caps (Dicyclomine Hcl) .Marland Kitchen.. 1-2 Cap By Mouth Three Times A Day As Needed Abdominal Cramping/spasm 12)  Metformin Hcl 500 Mg Xr24h-Tab (Metformin Hcl) .Marland Kitchen.. 1 Tab By Mouth Daily 13)  Welchol 625 Mg Tabs (Colesevelam Hcl) .... 5 Tab By Mouth Daily  Allergies (verified): 1)  ! Codeine  Past History:  Past Medical  History: Reviewed history from 02/02/2011 and no changes required. OSTEOPOROSIS  GLAUCOMA INTERMITTENT VERTIGO  B12 DEFICIENCY PERIPHERAL NEUROPATHY  GERD  ANXIETY DISORDER, GENERALIZED AODM  MIGRAINE, COMMON HYPERLIPIDEMIA  HYPOTHYROIDISM  IRRITABLE BOWEL SYNDROME   Past Surgical History: Reviewed history from 03/08/2009 and no changes required. 1989 C-section BTL 1992 colonoscopy/EGD 2009: nml  Social History: Reviewed history from 02/02/2011 and no changes required. 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 Remaining systems are negative.   Vital Signs:  Patient profile:   50 year old female Weight:      160 pounds Pulse rate:   92 / minute Pulse rhythm:   regular BP sitting:   113 / 83  (left arm) Cuff size:   regular  Vitals Entered By: Deliah Goody, RN (February 19, 2011 9:35 AM)  Physical Exam  General:  Well-developed well-nourished in no acute distress.  Skin is warm and dry.  HEENT is normal.  Neck is supple. No thyromegaly.  Chest is clear to auscultation with normal expansion.  Cardiovascular exam is regular rate and rhythm.  Abdominal exam nontender or distended. No masses palpated. Extremities show no edema. neuro grossly intact  Impression & Recommendations:  Problem # 1:  CHEST PAIN (ICD-786.50) Symptoms are atypical. However risk factors and abnormal stress echocardiogram. Proceed with cardiac catheterization. The risks and benefits have been discussed and the patient agrees to proceed. He include but are not limited to stroke, myocardial infarction and death. Patient to take an aspirin daily.  Problem # 2:   HYPERTENSION (ICD-401.9)  Blood pressure controlled.  Orders: TLB-BMP (Basic Metabolic Panel-BMET) (80048-METABOL)  Problem # 3:  AODM (ICD-250.00) Hold metformin 48 hours post catheterization. Her updated medication list for this problem includes:    Metformin Hcl 500 Mg Xr24h-tab (Metformin hcl) .Marland Kitchen... 1 tab by mouth daily  Problem # 4:  HYPERLIPIDEMIA (ICD-272.4) Continue present medications. Management per primary care. Her updated medication list for this problem includes:    Crestor 40 Mg Tabs (Rosuvastatin calcium) .Marland Kitchen... 1/2 tab by mouth daily    Welchol 625 Mg Tabs (Colesevelam hcl) .Marland KitchenMarland KitchenMarland KitchenMarland Kitchen 5 tab by mouth daily  Problem # 5:  HYPOTHYROIDISM (ICD-244.9)  Her updated medication list for this problem includes:    Levothyroxine Sodium 200 Mcg Tabs (Levothyroxine sodium) .Marland Kitchen... Take 1 tablet by mouth once a day  Problem # 6:  IRRITABLE BOWEL SYNDROME (ICD-564.1)  Problem # 7:  TOBACCO ABUSE (ICD-305.1) Patient counseled on discontinuing.  Other Orders: TLB-CBC Platelet - w/Differential (85025-CBCD) TLB-PT (Protime) (85610-PTP) T-2 View CXR (71020TC)  Patient Instructions: 1)  Your physician has requested that you have a cardiac catheterization.  Cardiac catheterization is used to diagnose and/or treat various heart conditions. Doctors may recommend this procedure for a number of different reasons. The most common reason is to evaluate chest pain. Chest pain can be a symptom of coronary artery disease (CAD), and cardiac catheterization can show whether plaque is narrowing or blocking your heart's arteries. This procedure is also used to evaluate the valves, as well as measure the blood flow and oxygen levels in different parts of your heart.  For further information please visit [email or web address]  Please follow instruction sheet, as given.

## 2011-02-27 NOTE — Letter (Signed)
Summary: Cardiac Catheterization Instructions- [facility]  [employer], Main Office  [address]   [place], [place] [address]   Phone: [phone]  Fax: [phone]     02/19/2011 MRN: [id number]  [name] [name] [address] [place], [place]  [address]  [place]  Dear Ms. Carolan Clines,   You are scheduled for a Cardiac Catheterization on TUESDAY 02-20-11 with Dr. Shirlee Latch Please arrive to the 1st floor of the Heart and V[facility] Center at [facility] at      9:30 am        on the day of your procedure. Please do not arrive before 6:30 a.m. Call the Heart and Vascular Center at [phone] if you are unable to make your appointmnet. The Code to get into the parking garage under the building is 3000. Take the elevators to the 1st floor. You must have someone to drive you home. Someone must be with you for the first 24 hours after you arrive home. Please wear clothes that are easy to get on and off and wear slip-on shoes. Do not eat or drink after midnight except water with your medications that morning. Bring all your medications and current insurance cards with you.  _XX__ DO NOT take these medications before your procedure: DO NOT TAKE METFORMIN TUESDAY,WEDNESDAY OR THURSDAY  ___ Make sure you take your aspirin.  ___ You may take ALL of your medications with water that morning. ________________________________________________________________________________________________________________________________  ___ DO NOT take ANY medications before your procedure.  ___ Pre-med instructions:  ________________________________________________________________________________________________________________________________  The usual length of stay after your procedure is 2 to 3 hours. This can vary.  If you have any questions, please call the office at the number listed above.   Deliah Goody, RN

## 2011-03-01 ENCOUNTER — Encounter: Payer: Self-pay | Admitting: Physician Assistant

## 2011-03-07 ENCOUNTER — Ambulatory Visit (INDEPENDENT_AMBULATORY_CARE_PROVIDER_SITE_OTHER): Payer: 59 | Admitting: Physician Assistant

## 2011-03-07 ENCOUNTER — Encounter: Payer: Self-pay | Admitting: Physician Assistant

## 2011-03-07 ENCOUNTER — Ambulatory Visit (INDEPENDENT_AMBULATORY_CARE_PROVIDER_SITE_OTHER)
Admission: RE | Admit: 2011-03-07 | Discharge: 2011-03-07 | Disposition: A | Discharge status: Home / Self Care | Payer: 59 | Source: Ambulatory Visit | Attending: Cardiology | Admitting: Cardiology

## 2011-03-07 DIAGNOSIS — R079 Chest pain, unspecified: Secondary | ICD-10-CM

## 2011-03-07 DIAGNOSIS — R0602 Shortness of breath: Secondary | ICD-10-CM

## 2011-03-07 DIAGNOSIS — R5383 Other fatigue: Secondary | ICD-10-CM

## 2011-03-07 DIAGNOSIS — R5381 Other malaise: Secondary | ICD-10-CM

## 2011-03-07 MED ORDER — IOHEXOL 300 MG/ML  SOLN
100.0000 mL | Freq: Once | INTRAMUSCULAR | Status: AC | PRN
  Administered 2011-03-07: 100 mL via INTRAVENOUS

## 2011-03-07 NOTE — Assessment & Plan Note (Signed)
Controlled.  

## 2011-03-07 NOTE — Assessment & Plan Note (Signed)
She has symptoms of snoring and witnessed apneic episodes.  Get a sleep med consult to workup sleep apnea.

## 2011-03-07 NOTE — Progress Notes (Signed)
History of Present Illness: Primary Cardiologist: Dr. Olga Millers  [name] [name] is a 50 y.o. female with a h/o DM2, HTN, HLP GERD and IBS who was evaluated recently with a stress echo for chest pain.  This was abnormal and she was referred for cath.  This was done on 02/20/11 and demonstrated normal cors and EF 55%.  She returns for follow up.  She is still having left chest pain.  No pleuritic symptoms.  No syncope.  She does smoke and is trying to quit.  She denies orthopnea or pnd.  No edema.  No hemoptysis.  She denies any h/o recent surgery or long trips.  She notes DOE.  She feels like it has gotten worse since her pain started several months ago.    Past Medical History  Diagnosis Date  . Osteoporosis   . Glaucoma   . Intermittent vertigo   . B12 deficiency   . Peripheral neuropathy   . GERD (gastroesophageal reflux disease)   . Anxiety   . Diabetes mellitus   . Migraine   . Hyperlipidemia   . Hypothyroidism   . IBS (irritable bowel syndrome)     Current Outpatient Prescriptions  Medication Sig Dispense Refill  . ALPRAZolam (XANAX) 0.25 MG tablet Take 0.25 mg by mouth. 1 tab by mouth daily as needed for panic attacks       . amitriptyline (ELAVIL) 25 MG tablet Take 25 mg by mouth at bedtime.        . colesevelam (WELCHOL) 625 MG tablet Take 1,875 mg by mouth. 5 tabs by mouth daily       . dicyclomine (BENTYL) 10 MG capsule Take 10 mg by mouth. 1-2 caps by mouth three times a day as needed       . ergocalciferol (VITAMIN D2) 50000 UNITS capsule Take 50,000 Units by mouth. 1 cap by mouth weekly x 12 weeks       . glucose blood (ONE TOUCH TEST STRIPS) test strip 1 each by Other route as needed. Use as instructed       . Lancets (ONETOUCH ULTRASOFT) lancets 1 each by Other route as needed. Use as instructed       . levothyroxine (SYNTHROID, LEVOTHROID) 200 MCG tablet Take 200 mcg by mouth daily.        . metFORMIN (GLUMETZA) 500 MG (MOD) 24 hr tablet Take 500 mg by mouth daily.         . promethazine (PHENERGAN) 25 MG tablet Take 25 mg by mouth as needed.        . rosuvastatin (CRESTOR) 40 MG tablet Take 40 mg by mouth. 1/2 tab daily       . sertraline (ZOLOFT) 50 MG tablet Take 50 mg by mouth daily.          Allergies  Allergen Reactions  . Codeine     REACTION: N \\T \ V   ROS:  Please see HPI.  Rest of ROS negative.  Vital Signs: BP 104/75  Pulse 90  Resp 12  Ht 5\' 1"  (1.549 m)  Wt 161 lb (73.029 kg)  BMI 30.42 kg/m2  PHYSICAL EXAM: Well nourished, well developed, in no acute distress HEENT: normal Neck: no JVD Cardiac:  normal S1, S2; RRR; no murmur Lungs:  clear to auscultation bilaterally, no wheezing, rhonchi or rales Abd: soft, nontender, no hepatomegaly Ext: no edema; RFA site without hematoma or bruit Skin: warm and dry Neuro:  CNs 2-12 intact, no focal abnormalities noted  EKG:  NSR, HR 90, rightward axis, TWI leads 3, aVF (changed from prior tracing 02/02/11).  ASSESSMENT AND PLAN:

## 2011-03-07 NOTE — Patient Instructions (Signed)
Your physician recommends that you schedule a follow-up appointment in: with PRIMARY CARE PHYSICIAN AS PER SCOTT WEAVER, PA-C.  You have been referred to DR. CLANE OR DR. SOOD FOR [facility]IG[facility] [phone] FOR POSSIBLE SLEEP STUDY AS PER SCOTT WEAVER, PA-C.   Non-Cardiac CT scanning, (CAT scanning), is a noninvasive, special x-ray that produces cross-sectional images of the body using x-rays and a computer. CT scans help physicians diagnose and treat medical conditions. For some CT exams, a contrast material is used to enhance visibility in the area of the body being studied. CT scans provide greater clarity and reveal more details than regular x-ray exams. PLEASE HAVE THIS DONE TODAY IN OUR OFFICE AND IF NO AVAILABLE APPT TIME HERE THEN PLEASE HAVE THIS TEST DONE TODAY @  HOSPITAL FOR CHEST PAIN [phone] AND SOB [phone] AND TO RULE OUR A PULMONARY EMBOLUS

## 2011-03-07 NOTE — Assessment & Plan Note (Signed)
She is trying to quit. 

## 2011-03-07 NOTE — Assessment & Plan Note (Signed)
She had no CAD on cath.  Her ekg has definitely changed and she has a S1, Q3, T3 pattern.  Lead placement seems appropriate.  Although she does not have a h/o recent surgery, travel or leg injury, I think we should do a CT to r/o PE.  Will get that today.  She had a normal creatinine 2 weeks ago.    If her CT is negative, I suggest she follow up with her PCP to workup possible pulmonary process causing her symptoms of chest pain and SOB (?COPD).

## 2011-03-13 ENCOUNTER — Telehealth: Payer: Self-pay | Admitting: Family Medicine

## 2011-03-13 NOTE — Telephone Encounter (Signed)
Note form Pt's cardiologist PA Tereso Newcomer)  Reviewed...neg Cath Neg CT for PE... Recommended further eval here for COPD etc....please call pt to schedule if she is continuing to have dyspnea.  I will forward this note to make Cards office aware.

## 2011-03-14 NOTE — Telephone Encounter (Signed)
Left message on machine for patient to return call

## 2011-03-14 NOTE — Telephone Encounter (Signed)
Ok. Her CT was negative for PE.

## 2011-03-15 NOTE — Telephone Encounter (Signed)
Patient has appt in May but will call and get on list to be seen sooner if someone cancels

## 2011-03-21 ENCOUNTER — Other Ambulatory Visit: Payer: Self-pay

## 2011-03-21 LAB — GLUCOSE, CAPILLARY
Glucose-Capillary: 83 mg/dL (ref 70–99)
Glucose-Capillary: 96 mg/dL (ref 70–99)

## 2011-03-22 LAB — COMPREHENSIVE METABOLIC PANEL
ALT: 17 U/L (ref 0–35)
AST: 14 U/L (ref 0–37)
AST: 23 U/L (ref 0–37)
Albumin: 2.9 g/dL — ABNORMAL LOW (ref 3.5–5.2)
BUN: 8 mg/dL (ref 6–23)
CO2: 23 mEq/L (ref 19–32)
Calcium: 7.8 mg/dL — ABNORMAL LOW (ref 8.4–10.5)
Calcium: 9.3 mg/dL (ref 8.4–10.5)
Chloride: 109 mEq/L (ref 96–112)
Creatinine, Ser: 0.7 mg/dL (ref 0.4–1.2)
GFR calc Af Amer: >60 mL/min (ref >60)
GFR calc Af Amer: >60 mL/min (ref >60)
Potassium: 3.1 mEq/L — ABNORMAL LOW (ref 3.5–5.1)
Sodium: 138 mEq/L (ref 135–145)
Total Protein: 5.3 g/dL — ABNORMAL LOW (ref 6.0–8.3)
Total Protein: 7.7 g/dL (ref 6.0–8.3)

## 2011-03-22 LAB — CBC
MCHC: 33.1 g/dL (ref 30.0–36.0)
MCHC: 33.2 g/dL (ref 30.0–36.0)
MCV: 82.8 fL (ref 78.0–100.0)
Platelets: 253 10*3/uL (ref 150–400)
RBC: 5.94 MIL/uL — ABNORMAL HIGH (ref 3.87–5.11)
RDW: 17.5 % — ABNORMAL HIGH (ref 11.5–15.5)
RDW: 17.7 % — ABNORMAL HIGH (ref 11.5–15.5)
WBC: 12 10*3/uL — ABNORMAL HIGH (ref 4.0–10.5)

## 2011-03-22 LAB — TYPE AND SCREEN
ABO/RH(D): B NEG
Antibody Screen: NEGATIVE

## 2011-03-22 LAB — HEMOGLOBIN A1C: Mean Plasma Glucose: 128 mg/dL

## 2011-03-22 LAB — URINALYSIS, ROUTINE W REFLEX MICROSCOPIC
Bilirubin Urine: NEGATIVE
Glucose, UA: 100 mg/dL — AB
Ketones, ur: 40 mg/dL — AB
Leukocytes, UA: NEGATIVE
pH: 8 (ref 5.0–8.0)

## 2011-03-22 LAB — GLUCOSE, CAPILLARY
Glucose-Capillary: 103 mg/dL — ABNORMAL HIGH (ref 70–99)
Glucose-Capillary: 91 mg/dL (ref 70–99)
Glucose-Capillary: 97 mg/dL (ref 70–99)

## 2011-03-22 LAB — URINE MICROSCOPIC-ADD ON

## 2011-03-22 LAB — DIFFERENTIAL
Eosinophils Absolute: 0 10*3/uL (ref 0.0–0.7)
Eosinophils Relative: 0 % (ref 0–5)
Lymphs Abs: 0.8 10*3/uL (ref 0.7–4.0)
Monocytes Relative: 1 % — ABNORMAL LOW (ref 3–12)

## 2011-03-22 LAB — HEMOGLOBIN AND HEMATOCRIT, BLOOD
HCT: 38.8 % (ref 36.0–46.0)
HCT: 41.6 % (ref 36.0–46.0)

## 2011-03-22 LAB — HEMOCCULT GUIAC POC 1CARD (OFFICE): Fecal Occult Bld: NEGATIVE

## 2011-03-22 LAB — POCT PREGNANCY, URINE: Preg Test, Ur: NEGATIVE

## 2011-03-27 ENCOUNTER — Ambulatory Visit: Payer: Self-pay | Admitting: Family Medicine

## 2011-03-30 ENCOUNTER — Encounter: Payer: Self-pay | Admitting: Pulmonary Disease

## 2011-04-03 ENCOUNTER — Institutional Professional Consult (permissible substitution): Payer: 59 | Admitting: Pulmonary Disease

## 2011-04-06 ENCOUNTER — Other Ambulatory Visit: Payer: Self-pay | Admitting: Family Medicine

## 2011-04-10 ENCOUNTER — Other Ambulatory Visit: Payer: Self-pay

## 2011-04-16 ENCOUNTER — Ambulatory Visit: Payer: Self-pay | Admitting: Family Medicine

## 2011-04-16 ENCOUNTER — Other Ambulatory Visit (INDEPENDENT_AMBULATORY_CARE_PROVIDER_SITE_OTHER): Payer: 59 | Admitting: Family Medicine

## 2011-04-16 DIAGNOSIS — E119 Type 2 diabetes mellitus without complications: Secondary | ICD-10-CM

## 2011-04-16 LAB — BASIC METABOLIC PANEL
CO2: 24 mEq/L (ref 19–32)
Chloride: 106 mEq/L (ref 96–112)
Creatinine, Ser: 0.8 mg/dL (ref 0.4–1.2)
Sodium: 137 mEq/L (ref 135–145)

## 2011-04-16 LAB — HEPATIC FUNCTION PANEL
ALT: 10 U/L (ref 0–35)
Alkaline Phosphatase: 59 U/L (ref 39–117)
Bilirubin, Direct: 0 mg/dL (ref 0.0–0.3)
Total Bilirubin: 0.4 mg/dL (ref 0.3–1.2)
Total Protein: 6.5 g/dL (ref 6.0–8.3)

## 2011-04-16 LAB — MICROALBUMIN / CREATININE URINE RATIO
Creatinine,U: 17.4 mg/dL
Microalb, Ur: 0.3 mg/dL (ref 0.0–1.9)

## 2011-04-24 ENCOUNTER — Institutional Professional Consult (permissible substitution): Payer: 59 | Admitting: Pulmonary Disease

## 2011-04-24 NOTE — Discharge Summary (Signed)
NAME:  [name], [name] NO.:  [id number]   MEDICAL RECORD NO.:  [id number]          PATIENT TYPE:  INP   LOCATION:  4712                         FACILITY:  [facility]   PHYSICIAN:  Valerie A. Felicity Coyer, MDDATE OF BIRTH:  [birth year]   DATE OF ADMISSION:  03/08/2009  DATE OF DISCHARGE:  03/10/2009                               DISCHARGE SUMMARY   DISCHARGE DIAGNOSES:  1. Intractable nausea and vomiting with coffee-ground emesis.  No      evidence of active gastrointestinal bleed.  Symptoms resolved.  2. Type 2 diabetes, diet controlled.  Hemoglobin A1c 6.1 this      admission.  Continue same postdischarge.  3. Generalized anxiety disorder.  4. Dyslipidemia.  5. Hypothyroidism.  6. Hypokalemia due to gastrointestinal loss, replaced.   Discharge medications include:  1. Protonix 40 mg once daily indefinitely, may substitute Prilosec OTC      if out of Protonix.  2. Zofran 8 mg one p.o. q.a.m. for nausea scheduled x7 days and p.r.n.  3. Phenergan 25 mg 1 p.o. nightly scheduled x7 days, then p.r.n.      nausea.   Other medications are as prior to admission without change and include:  1. Elavil 10 mg nightly.  2. Glycopyrrolate 2 mg p.o. b.i.d.  3. Levothyroxine 100 mcg daily.  4. Allegra 180 mg daily.  5. Sertraline 50 mg once daily.  6. Pravastatin 40 mg once daily.  7. Xanax 0.25 mg daily as needed for panic attacks.   Consult this hospitalization include [facility], Dr. Christella Hartigan, on behalf  of usual physician Dr. Jarold Motto.   PROCEDURES:  None.   CONDITION ON DISCHARGE:  Medically improved and stable.  No further  nausea or vomiting and tolerating regular diet without complications.   HOSPITAL COURSE:  1. Intractable nausea and vomiting with question hematemesis.  The      patient is a 50 year old woman with history of IBS who came to the      emergency room complaining of diffuse abdominal pain with      intractable nausea and vomiting.  She said the vomiting  had      initially been yellow-green bile, but then turned to dark with      coffee grounds and streaks of red blood.  Due to these concerns of      bleeding, she came to the emergency room for further evaluation      where she was found to be hemodynamically stable with mild      tachycardia and a normal hemoglobin level.  Hemoccult stool was      negative.  Her blood was positive.  After discussion by the EDP      with GI, plans were for discharge home with close outpatient      followup.  Given intractable nature of the patient's nausea and      vomiting, she was unable to be discharged home and subsequently      referred to admission.  She was admitted by the Hospital Service      for IV fluid, symptomatic care, and seen  in consult by GI.  It was      not felt to warrant repeat EGD or colon as this has been done      approximately 1 year prior with no abnormal findings and the      patient with few risk factors for peptic ulcer disease.  For the      bleeding, she was monitored with serial H and H and did have slight      dilution of her hemoglobin that remained stable at 12.9 and 12.8      without further signs or symptoms of bleeding.  She had no melena      or diarrhea during this hospitalization she was treated with proton      pump inhibitor, IV fluids, as well as scheduled Zofran in the      morning and Phenergan at night with good relief.  Her diet has been      advanced from clears to solid without complications, and at this      time she is having no further pain, no nausea or vomiting, and      ready for discharge home.  GI has recommended the above recipe for      scheduled Zofran a.m. and Phenergan nightly for the next week and      then to be used p.r.n. along with indefinite proton pump inhibitor      for her gastric lining protection.  She will follow up with Dr.      Jarold Motto in the office as needed as well as with her primary care      physician, Dr. Kerby Nora,  later in this month, has routine      followup for her other chronic medical conditions.  2. Other medical issues.  The patient's other chronic medical issues      are stable and without change during this hospitalization.  She has      had no other changes to her regimen except for the new medications      as listed above.  Again, her diabetes has been well controlled this      hospitalization on sliding scale, number ranging over 110s, and her      A1c has been 6.1 during this hospitalization.  Please see chart for      further details of hospitalization.  Discharge stable for home.      Greater than 30 minutes spent on day of discharge coordination for      discharge planning.      Valerie A. Felicity Coyer, MD  Electronically Signed     VAL/MEDQ  D:  03/10/2009  T:  03/11/2009  Job:  [id number]

## 2011-04-24 NOTE — Assessment & Plan Note (Signed)
[facility]                         GASTROENTEROLOGY OFFICE NOTE   NAME:[name], [name] MATSUSHITA                       MRN:          [id number]  DATE:02/10/2008                            DOB:          03-Oct-1961    She is being admitted to [facility] today to Dr. Christella [name].  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 Cleta [name] for  intractable nausea and vomiting, diarrhea over the last year.   [name] [name] has been treated by Dr. Cleta [name] for hyperlipidemia,  hypothyroidism, and was recently admitted on January 19, 2008 to [facility] with nausea, vomiting, and diarrhea.  At that time, she  did have guaiac positive stool and complained of intermittent bright red  blood per rectum and hematemesis.  She responded quickly to bowel rest  and conservative therapy, and was discharged, and has been on Nexium 40  mg a day, but continues to be sick.   The patient describes spells of diffuse GI dysfunction, which happened  every week and lasts a couple of weeks in duration, associated with  upper and lower abdominal cramping and sharp pain, watery diarrhea,  nausea and vomiting, and rather severe dyspepsia and reflux symptoms.  She has had no real hepatobiliary complaints such as clay-colored  stools, dark urine, icterus, fevers or chills.  Also, despite all these  complaints, the patient has had surprisingly no weight loss.  She denies  any specific food intolerances.  On reviewing what limited records I  have, she has not had any recent GI evaluation, but did have a negative  colonoscopy and endoscopy in February of 2001.  This included ileal  biopsy and biopsy of the stomach for Helicobacter pylori infection.  She  did have some mild NSAID-induced gastroduodenitis at that time.  Small  bowel biopsy also was obtained at that time and was normal, as was  ultrasound of the abdomen.   The patient's past medical history is remarkable  for chronic thyroid  dysfunction, hypercholesterolemia, and she has had a previous tubal  ligation and Cesarean section.   MEDICATIONS:  1. Synthroid 0.75 mg a day.  2. Nexium 40 mg a day.  3. Zantac 300 mg at bedtime.  4. Simvastatin questionable dose daily.   She denies drug allergies.   FAMILY HISTORY:  Noncontributory.   SOCIAL HISTORY:  She is married and lives with her husband and son.  She  has a 12th grade education.  She denies abuse of alcohol, cigarettes, or  NSAIDs.   REVIEW OF SYSTEMS:  Positive for productive cough, which has been worse  over the last several weeks with questionable hemoptysis.  She says she  has regular menstrual period with the last period 2 weeks ago.  She  describes chronic fatigue, but denies rashes, swollen joints, mouth  sores, et Karie Soda.  Review of systems negative for any cardiovascular,  other pulmonary, genitourinary, or neuropsychiatric problems.   EXAM:  She is an acutely ill-appearing white female who is chronically  coughing and gagging, making exam process difficult.  She is 5 feet 1-1/2  inches tall and weighs 157 pounds.  Blood pressure  is 142/90 and pulse was 80 and regular.  She was afebrile.  I could not appreciate stigmata of chronic liver disease.  CHEST:  Entirely clear and I could not appreciate murmur, gallop, or  rub.  She appeared to be in a regular rhythm.  ABDOMEN:  Showed no organomegaly, masses, or significant tenderness.  In  fact, I thought she had a positive stethoscope sign in both her lower  and upper abdomen.  Bowel sounds were normal.  Peripheral extremities were unremarkable.  Mental status was clear.  Inspection of the rectum was normal, without fissures or fistulae.  There was no stool in the rectal vault for guaiac testing, but I could  not appreciate any rectal masses or tenderness.   ASSESSMENT:  1. Chronic gastrointestinal problems, most likely consistent with      irritable bowel syndrome with  probable strong functional overlay -      rule out cholelithiasis.  Her symptoms really do not seem      consistent with inflammatory bowel disease.  2. Worsening cough - rule out pneumonitis.  3. Vague history of recurrent hematemesis and diarrhea - consider      occult NSAID use and gut injury.  4. History of essential hypertension and hypothyroidism, and      hyperlipidemia.   RECOMMENDATIONS:  I have gone ahead and admitted [name] to [facility] for bowel rest and IV fluids.  Will obtain stool urine and  sputum cultures.  I will start her on IV Protonix and p.r.n. Phenergan,  and p.r.n. Ativan.  I have scheduled her for CT scan of the abdomen and  pelvis in the morning with possible endoscopy and colonoscopy, depending  on her workup and clinical course.  I have also ordered flat and upright  KUB, and PA and lateral chest x-ray.   ADDENDUM:  Lab data from recent admission on February 10 showed normal  liver function tests and CBC.  She did, at that time, have a positive  fecal occult blood test, however.     [name] Rea. Jarold Motto, MD, Caleen Essex, FAGA  Electronically Signed    DRP/MedQ  DD: 02/10/2008  DT: 02/10/2008  Job #: [id number]   cc:   [name] [name], M.D.

## 2011-04-24 NOTE — Discharge Summary (Signed)
NAMEMERIDA, ALCANTAR NO.:  [id number]   MEDICAL RECORD NO.:  [id number]          PATIENT TYPE:  INP   LOCATION:  6742                         FACILITY:  [facility]   PHYSICIAN:  Leighton Roach McDiarmid, M.D.DATE OF BIRTH:  02-28-1961   DATE OF ADMISSION:  01/19/2008  DATE OF DISCHARGE:  01/20/2008                               DISCHARGE SUMMARY   PRIMARY CARE Alonah Lineback:  Brett Canales A. Cleta Alberts, M.D., [facility].   CONSULTANTS:  None.   PROCEDURES:  None.   REASON FOR ADMISSION:  The patient is a 50 year old female with a  history of hyperlipidemia, hypothyroidism, and GI ulcers who presented  with hematemesis and bright red blood per rectum.   PRIMARY DISCHARGE DIAGNOSIS:  Viral gastroenteritis.   SECONDARY DIAGNOSES:  Hyperlipidemia and hypothyroidism.   ADMISSION LABS:  Included a CBC which showed a white blood cell count of  16.6, hemoglobin of 14.1, hematocrit 42.4, and platelets of 366,000.  Electrolytes showed a sodium of 140, potassium 3.7, chloride 108, bicarb  21.5, BUN 11, and creatinine 0.8.  The patient was found to have an  albumin of 2.8, total protein of 5.6, and other LFTs were found to be  normal.   DISCHARGE MEDICATIONS:  1. Nexium 40 mg p.o. daily.  2. Zantac 300 mg p.o. daily.  3. Synthroid 75 mcg p.o. daily.  4. Simvastatin, dose uncertain.  The patient was instructed to      continue her dose as previously prescribed.   HOSPITAL COURSE:  The patient was admitted after a 1-day history of  nausea, vomiting, and diarrhea which initially started out nonbloody and  nonbilious for the vomiting and started out brown and watery for the  stool, but both the emesis and diarrhea became bloody as the illness  progressed, so the patient was seen at the urgent care and sent over to  the emergency department.   PROBLEM:  1. Nausea, vomiting, and diarrhea.  The patient's symptoms were      consistent with gastroenteritis.  Initially, we planned to check      stool studies.  However, the patient's diarrhea stopped by the time      these were ordered and there were no stools to examine.  Once in      the emergency department, the patient stopped having emesis and      diarrhea.  She was given Zofran, placed on IV fluids, and given      Pepto-Bismol initially because of the presentation.  IV Cipro was      started to cover for infectious diarrhea.  However, overnight, the      patient's symptoms stopped and her picture looked more like one of      viral gastroenteritis.  Thus, the Cipro was discontinued.  The      patient's IV fluids were KVO'ed and she began to take good p.o.  2. Heme-positive stool.  The patient did have a stool in the emergency      department that was fecal occult blood  positive.  This is likely      secondary  to her viral gastroenteritis.  The patient remained      hemodynamically stable.  She had initial hemoglobin of 14.1.      Hemoglobin the morning following admission was slightly decreased      to 11.9.  However, she was given fluids overnight and we believe      that this drop in her hemoglobin was probably most likely due to      hemodilution.  The patient can discuss with her primary care      Orelia Brandstetter whether a colonoscopy will be important.  If she does have      another bloody bowel movement, it seems that this would be a      prudent course.  3. Hyperlipidemia, we continued the patient on her home dose of Zocor.      Fasting lipid panel was checked which showed total cholesterol of      120, triglycerides of 115, HDL of 23, LDL of  74, and VLDL of 23,      and total cholesterol-to-HDL ratio is 5:2.  4. Hypothyroidism, continued her Synthroid.  We checked the TSH, which      was within normal limits.   CONDITION ON DISCHARGE:  Improved.   Pending test results at the time of discharge, none.   DISPOSITION:  The patient was discharged to home.   DISCHARGE FOLLOWUP:  The patient is to follow up with Dr.  Cleta Alberts at [facility] on February 03, 2008, at 8:45 a.m.   FOLLOWUP ISSUES:  The patient can discuss with Dr. Cleta Alberts whether or not a  colonoscopy is warranted at this point or they will just wait to see if  there are any further signs of GI bleeding.      Asher Muir, MD  Electronically Signed      Leighton Roach McDiarmid, M.D.  Electronically Signed    SO/MEDQ  D:  01/20/2008  T:  01/21/2008  Job:  [id number]   cc:   Brett Canales A. Cleta Alberts, M.D.

## 2011-04-24 NOTE — Assessment & Plan Note (Signed)
[facility]                         GASTROENTEROLOGY OFFICE NOTE   NAME:[name] [name], [name] [name]                       MRN:          [id number]  DATE:03/04/2008                            DOB:          1961-02-17    [name] was admitted to the hospital from March 3 to February 13, 2008 because  of worsening nausea and vomiting, abdominal cramping, and diarrhea.  She  had a completely negative workup per Dr. Christella Hartigan including a CT scan of  the abdomen, endoscopy, colonoscopy, and laboratory parameters.  Biopsies of her small bowel and colon were normal.   She currently complains of some gnawing dyspepsia type complaints, but  otherwise is doing fairly well.  Her vital signs are all normal and her  abdominal exam is unremarkable.   ASSESSMENT:  1. Irritable bowel syndrome, diarrhea predominant.  2. Previous guaiac positive stool of unexplained etiology.  3. Rule out cholelithiasis.   RECOMMENDATIONS:  1. Check abdominal ultrasound.  2. Repeat Hemoccult cards.  3. Trial of Amitriptyline 10 mg at bedtime.  4. Primary care followup with Dr. Milinda Antis since the patient lives near      [facility] and wants primary care referral.     Vania Rea. Jarold Motto, MD, Caleen Essex, FAGA  Electronically Signed    DRP/MedQ  DD: 03/04/2008  DT: 03/04/2008  Job #: [id number]   cc:   Marne A. Milinda Antis, MD  Rachael Fee, MD

## 2011-04-24 NOTE — Discharge Summary (Signed)
NAME:  KYNDLE, SCHLENDER NO.:  [id number]   MEDICAL RECORD NO.:  [id number]          PATIENT TYPE:  INP   LOCATION:  1525                         FACILITY:  [facility]   PHYSICIAN:  Rachael Fee, MD   DATE OF BIRTH:  1961-01-21   DATE OF ADMISSION:  02/10/2008  DATE OF DISCHARGE:  02/13/2008                               DISCHARGE SUMMARY   ADMISSION DIAGNOSES:  37. A 50 year old white female with intermittent nausea and vomiting,      abdominal pain and diarrhea x1 year now progressive over the past      month and presenting with 24-hour history of nausea and vomiting,      etiology not clear, rule out inflammatory bowel disease, irritable      bowel syndrome or symptomatic gallbladder disease.  2. Gastroesophageal reflux disease.  3. Hypothyroidism.  4. Hyperlipidemia.  5. Mild anemia, microcytic.   DISCHARGE DIAGNOSES:  40. A 50 year old white female with evidence for pancolitis on CT scan      abdomen and pelvis but with negative colonoscopy, random biopsies      taken.  2. Nausea and vomiting, recurrent.  Negative      esophagogastroduodenoscopy this admission.  Biopsies pending.      Suspect viral gastroenteritis superimposed on underlying irritable      bowel syndrome.  3. Leukocytosis, resolved.  4. Gastroesophageal reflux disease.  5. Hypothyroidism.  6. Hyperlipidemia.  7. Mild anemia, microcytic.   CONSULTATIONS:  None.   PROCEDURES:  CT scan of the abdomen and pelvis and upper endoscopy,  colonoscopy per Dr. Christella Hartigan.   BRIEF HISTORY:  Heavenly is a 50 year old white female known to Dr. Cleta Alberts at  Urgent Care and very remotely to Dr. Jarold Motto from endoscopy and  colonoscopy done in 2001.  This was a negative exam including ileal  biopsies and the endoscopy had shown an NSAID induced and used  gastritis.  The patient had had a recent admission at [facility]. [facility] on the teaching service with complaints of hematemesis  and hematochezia  along with nausea and vomiting and diarrhea on January 19, 2008, through January 20, 2008.  She did not have GI consultation  at that time and was felt to have a viral gastroenteritis and was  discharged in 24 hours.  Apparently her diarrhea improved after that and  stool cultures were not obtained.  She says since that admission, she  has continued to feel fatigued, has not been eating much, but also has  not lost any weight.  She has had persistent problems with nausea and  abdominal discomfort postprandially, primarily in the epigastric region.  She had not been having any vomiting until the evening of February 09, 2008, when she had several episodes of vomiting, no fever or chills and  has been having three to four bowel movements per day over the past  month with occasional streaks of bright red blood.  She says she has  been having chronic problems with lower abdominal discomfort and  epigastric discomfort.  She was seen and evaluated in the  office by Dr.  Jarold Motto, felt to be acutely ill and admitted to the hospital for  further diagnostic evaluation.   LABORATORY STUDIES:  On admission, February 10, 2008, WBC of 17, hemoglobin  13.9, hematocrit of 41.2, platelets 403.  Protime 13, INR of 1.  Electrolytes within normal limits, creatinine 0.81.  Liver function  studies normal.  Amylase and lipase normal.  On February 11, 2008, serum  iron was 43, TIBC of 321, iron saturation of 13.  C-reactive protein low  at 0.4.  UA showed 3-6 wbc, 0-2 rbc.  Urine culture 100,000 multiple  species suggest contamination.  Stool cultures were done with stool for  C difficile negative, stool culture negative, stool for lactoferrin  positive, stool for O&P negative.  Follow-up CBC on February 11, 2008,  showed a WBC of 13.6, hemoglobin 12.4, hematocrit of 37.4.  On February 12, 2008, WBC of 9.8, hemoglobin 13.2, hematocrit of 38.6, sed rate was 12.   CT scan of the abdomen and pelvis on February 11, 2008, showed  mild mural  thickening of the entire colon suggesting pancolitis.  Differential  including infectious or inflammatory ischemia unlikely.   HOSPITAL COURSE:  The patient was admitted to the service of Dr. Christella Hartigan,  who was covering the hospital.  She was placed on IV fluids, clear  liquid diet.  Baseline labs were obtained as was CT scan of the abdomen  and pelvis and stool cultures were ordered as well.  The CT did show a  mild pancolitis.  Decision was made to proceed with colonoscopy to rule  out inflammatory bowel disease.  CRP was ordered which was low and sed  rate was normal at 12.  Colonoscopy per Dr. Christella Hartigan was a normal exam  with no evidence of inflammation.  Random biopsies were taken to rule  out a microscopic colitis.  These biopsies have since returned and shown  no evidence of active colitis or dysplasia.  She also underwent upper  endoscopy which was a normal exam.  Her diet was advanced.  She seemed  to tolerate this without difficulty and at that point, her diarrhea had  improved.  Cultures were all negative and she was allowed discharge on  1. Synthroid 0.75 daily.  2. Simvastatin 40 mg daily.  3. Robinul Forte 2 mg b.i.d. p.r.n. for cramping.  4. Phenergan 25 every 6 hours as needed for nausea.  5. Her Nexium was discontinued.   FOLLOW UP:  She was to follow up with Dr. Jarold Motto on March 04, 2008,  at 11:00 a.m., to call for any problems in the interim and to follow a  bland soft diet.      Mike Gip, PA-C      Rachael Fee, MD  Electronically Signed    AE/MEDQ  D:  02/23/2008  T:  02/23/2008  Job:  [id number]

## 2011-04-24 NOTE — H&P (Signed)
NAMESHARMAN, GARROTT NO.:  [id number]   MEDICAL RECORD NO.:  [id number]          PATIENT TYPE:  INP   LOCATION:  6742                         FACILITY:  [facility]   PHYSICIAN:  Leighton Roach McDiarmid, M.D.DATE OF BIRTH:  [birth year]   DATE OF ADMISSION:  01/19/2008  DATE OF DISCHARGE:                              HISTORY & PHYSICAL   CHIEF COMPLAINT:  Nausea, vomiting, and diarrhea.   HISTORY OF PRESENT ILLNESS:  This is a 50 year old female with history  of hyperlipidemia and hypothyroid who presents to ED after sent by  physicians at urgent care for questionable GI bleed per patient and her  husband.  She reports that she started having vomiting and diarrhea  abruptly last night.  Emesis is nonbloody and became bilious overnight.  Stool started out watery and became blood streaked by this morning and  emesis became blood streaked as well.  Denies any known sick contacts or  previous episodes.  She was given Zofran and Imodium in the ED with  partial relief of symptoms.  She states she has been unable to keep any  food or liquids down.  She has a positive history of chronic NSAID use.   PAST MEDICAL HISTORY:  1. Hyperlipidemia.  2. History of GI ulcer.  3. Hypothyroidism.  4. Migraines.   SOCIAL HISTORY:  She lives with her husband and teenage son.  She does  not drink alcohol or smoke tobacco.  She is currently unemployed.   MEDS:  1. Nexium 40 mg daily.  2. Zantac.  3. Synthroid 75 mcg daily.  4. Simvastatin.   REVIEW OF SYSTEMS:  GENERAL:  No fevers.  GI:  Positive abdominal pain.  Denies melena or hematochezia.  CARDIOVASCULAR:  Denies chest pain or  shortness of breath.   FAMILY HISTORY:  Mother had MI.  Father also had an MI.   LABS:  White count 16..6, hemoglobin 14.1, hematocrit 42.4, platelets of  366, creatinine of 0.8.   PHYSICAL EXAMINATION:  VITAL SIGNS:  Temperature is 97.4.  Pulse is 89.  Respiratory rate is 18.  Blood pressure is 101/64.  GENERAL:  She is alert and appears uncomfortable.  HEENT:  Poor dentition.  Dry mucous membranes.  RESPIRATORY:  Clear to auscultation bilaterally.  CARDIOVASCULAR:  Regular rate and rhythm.  ABDOMEN:  Positive bowel sounds.  Tender to palpation of epigastrium.  RECTUM:  No visible hemorrhoids.  Hemoccult positive.   ASSESSMENT AND PLAN:  This is a 50 year old female with:  1. Nausea, vomiting, and diarrhea.  Symptoms consistent with      gastroenteritis.  We will check stool studies prior to giving any      anti motility agents.  We will continue IVFs .  We will also given      Zofran as needed for nausea.  No risk of C. diff at this time.  We      will check lipase although abdominal pain is likely secondary to      retching.  We will start Cipro in case of infectious diarrhea.  2. Heme-positive stools likely secondary to #  1.  Hemoglobin is stable      and hemodynamically stable as well.  We will check hemoglobin and      hematocrit this evening and again tomorrow morning but likely no GI      bleed.  3. Hyperlipidemia.  Continue Zocor and check a fasting lipid panel.  4. Hypothyroidism.  Continue Synthroid and check a TSH.  5. FEN/GI.  Continue IV fluids and advance diet as tolerated.  6. Disposition pending rehydration, #1 but likely D/C in am.      Ruthe Mannan, M.D.  Electronically Signed      Leighton Roach McDiarmid, M.D.  Electronically Signed    TA/MEDQ  D:  01/19/2008  T:  01/20/2008  Job:  [id number]

## 2011-04-24 NOTE — H&P (Signed)
NAME:  [name] [name], ALESHIRE NO.:  [id number]   MEDICAL RECORD NO.:  [id number]          PATIENT TYPE:  INP   LOCATION:  1525                         [facility]ACILITY:  [facility]   PHYSICIAN:  Vania Rea. Jarold Motto, MD, Caleen Essex, FAGA DATE OF BIRTH:  1961/06/07   DATE OF ADMISSION:  02/10/2008  DATE OF DISCHARGE:                              HISTORY & PHYSICAL   CHIEF COMPLAINT:  Nausea, vomiting and diarrhea.   HISTORY:  Yekaterina is a 50 year old white female known to Dr. Cleta Alberts at the  urgent care and very remotely to Dr. Jarold Motto from endoscopy and  colonoscopy done in 2001.  Colonoscopy was negative, including ileal  biopsies, and endoscopy showed an NSAID-induced gastritis.  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  She did not have GI consultation at that time.  She was felt to have a  viral gastroenteritis and was discharged within 24 hours.  Apparently  her diarrhea ceased after she was admitted and stool cultures were  unable to be obtained.  Stool was noted to be Hemoccult positive, and  she was mildly anemic with a hemoglobin of 11.9 on discharge.  Apparently since that admission she has been feeling fatigued and says  she has not been eating much but has not had any weight loss.  She has  had persistent problems with nausea and abdominal discomfort  postprandially, which has primarily been epigastric.  She had not been  having any vomiting until the evening of March 2, when she had several  episodes of vomiting.  She has not had any fever or chills, has had  diarrhea with 3 to 4 bowel movements per day over the past month with  occasional streaks of bright red blood.  She says that her stomach  always hurts both across the lower abdomen and in the epigastrium.  She  has been on chronic PPI therapy.   Denies any NSAID use and has not been  on any recent antibiotics.  Her family also relates that she has had GI  complaints over the past year intermittently with nausea, vomiting,  abdominal pain, and says that she has been having diarrhea with 2 to 3  bowel movements per day for several months.   CURRENT MEDICATIONS:  Simvastatin 40 mg daily, Nexium 40 daily,  Synthroid 0.75 daily and Zantac p.r.n.   ALLERGIES:  CODEINE, WHICH CAUSES NAUSEA.   PAST HISTORY:  Bilateral tubal ligation and C-section in 1989, history  of peptic ulcer disease, remote, hyperlipidemia, hypothyroidism and  migraine headaches.   FAMILY HISTORY:  Negative for GI disease as far as she is aware.   SOCIAL HISTORY:  The patient is married.  She is currently unemployed.  Is a nonsmoker, nondrinker.   REVIEW OF SYSTEMS:  Pertinent for weight gain over the past few months  despite multiple GI complaints.  The patient does have a history of  migraine headaches, says these are infrequent, occurring  every couple of  months and usually associated with nausea, sometimes vomiting.  GENITOURINARY:  Denies any dysuria, urgency, or frequency.  GASTROINTESTINAL:  As outlined above.  RESPIRATORY:  Denies any cough,  shortness of breath or sputum production.  CARDIOVASCULAR:  Negative for  chest pain or anginal symptoms.  HEENT:  Negative.   LABORATORY STUDIES:  The day of admission February 10, 2008:  WBC of 17,000,  hemoglobin 13.9, hematocrit of 41.2, MCV of 78, platelets 463.  Protime  13.3, INR of 1.0.  Electrolytes within normal limits.  Glucose 187.  LFTs normal.  Amylase 58, lipase 23.  UA:  3 to 6 WBCs.  X-rays are  pending.   PHYSICAL EXAMINATION:  GENERAL:  Well-developed white female in no acute  distress, somewhat anxious.  VITAL SIGNS:  Temperature is 98.6, blood pressure 129/70, pulse in the  90s, sats 98 on room air.  HEENT:  Nontraumatic, normocephalic.  EOMI, PERRLA.  Sclerae anicteric.  NECK:  Supple.   LUNGS:  Clear.  CARDIOVASCULAR:  Regular rate and rhythm with S1 and S2.  ABDOMEN:  Soft.  Tender in the epigastrium and across the lower abdomen.  There is  no guarding or rebound.  No mass or hepatosplenomegaly.  RECTAL:  No stool in the vault.  EXTREMITIES:  Without clubbing, cyanosis or edema.  NEUROLOGIC:  Grossly nonfocal.   IMPRESSION:  1. A 50 year old white female with intermittent nausea and vomiting,      abdominal pain and diarrhea x1 year, symptoms now progressive over      the past month with a 24-hour history of nausea and vomiting.      Etiology is not clear.  Rule out IBD, IBS, symptomatic gallbladder      disease.  2. Gastroesophageal reflux disease.  3. Hypothyroidism.  4. Hyperlipidemia.  5. Mild anemia, microcytic.   PLAN:  The patient is admitted for IV fluid hydration, antiemetics, IV  PPI,  stool cultures.  We will obtain plain films this evening and if  those are negative a CT of the abdomen and pelvis in the a.m.  May  require EGD and colonoscopy this admission as well.  For details, please  see the orders.      Amy Esterwood, PA-C      Vania Rea. Jarold Motto, MD, Caleen Essex, FAGA  Electronically Signed    AE/MEDQ  D:  02/11/2008  T:  02/11/2008  Job:  [id number]   cc:   Brett Canales A. Cleta Alberts, M.D.  Fax: [phone]

## 2011-04-27 ENCOUNTER — Encounter: Payer: Self-pay | Admitting: Family Medicine

## 2011-04-27 ENCOUNTER — Telehealth: Payer: Self-pay | Admitting: Family Medicine

## 2011-04-27 ENCOUNTER — Ambulatory Visit (INDEPENDENT_AMBULATORY_CARE_PROVIDER_SITE_OTHER): Payer: 59 | Admitting: Family Medicine

## 2011-04-27 DIAGNOSIS — R0609 Other forms of dyspnea: Secondary | ICD-10-CM

## 2011-04-27 DIAGNOSIS — E119 Type 2 diabetes mellitus without complications: Secondary | ICD-10-CM

## 2011-04-27 DIAGNOSIS — E039 Hypothyroidism, unspecified: Secondary | ICD-10-CM

## 2011-04-27 DIAGNOSIS — E785 Hyperlipidemia, unspecified: Secondary | ICD-10-CM

## 2011-04-27 DIAGNOSIS — R0602 Shortness of breath: Secondary | ICD-10-CM

## 2011-04-27 DIAGNOSIS — R0600 Dyspnea, unspecified: Secondary | ICD-10-CM

## 2011-04-27 DIAGNOSIS — I10 Essential (primary) hypertension: Secondary | ICD-10-CM

## 2011-04-27 DIAGNOSIS — Z87891 Personal history of nicotine dependence: Secondary | ICD-10-CM

## 2011-04-27 DIAGNOSIS — R0989 Other specified symptoms and signs involving the circulatory and respiratory systems: Secondary | ICD-10-CM

## 2011-04-27 LAB — LIPID PANEL
Cholesterol: 182 mg/dL (ref 0–200)
HDL: 31.9 mg/dL — ABNORMAL LOW (ref >39.00)
LDL Cholesterol: 118 mg/dL — ABNORMAL HIGH (ref 0–99)
Total CHOL/HDL Ratio: 6
Triglycerides: 161 mg/dL — ABNORMAL HIGH (ref 0.0–149.0)

## 2011-04-27 MED ORDER — LEVOTHYROXINE SODIUM 25 MCG PO TABS: ORAL_TABLET | ORAL | Status: DC

## 2011-04-27 NOTE — Telephone Encounter (Signed)
Notify pt that cholesterol is significantly better on welchol (180 down to 118!!!!).Marland Kitchen.almost at goal LDL <100. Continue to work on exercise and weight loss. Recheck at next labs in 3 months. Thyroid is about half better..need to increase further.  Have to add a 25 mcg tab since she is on highest dose. Recheck TSH in 4-6 weeks.

## 2011-04-27 NOTE — Assessment & Plan Note (Signed)
Due for reeval on new dose of medication.

## 2011-04-27 NOTE — Assessment & Plan Note (Signed)
Well controlled. Continue current medication.  

## 2011-04-27 NOTE — Assessment & Plan Note (Signed)
Due for further eval on welchol AND Crestor. Check today.

## 2011-04-27 NOTE — Progress Notes (Signed)
  Subjective:    Patient ID: [name] [name], female    DOB: 1961-02-27, 50 y.o.   MRN: [id number]  HPI Seen in the emergency room on January 15, 2011 with chest pain. Cardiac markers negative. EKG was abnormal. Seen Dr. Jens Som for  Chest pain and dyspnea. Had stress test followed by cardiac cath which was negative.  Had nml chest CT for eval of PE. Given neg cardiac work up  Dr Jens Som recommended pulm eval for cause of dyspnea and chest pain.  She has quit smoking in last 3 months! We will perform PFTs today.  Diabetes: inadequate control Lab Results  Component Value Date   HGBA1C 7.4* 04/16/2011    Using medications without difficulties:metformin Hypoglycemic episodes:None Hyperglycemic episodes:None Feet problems:None Blood Sugars averaging:FBS 110,  2 hours postprandial 190 eye exam within last year: Working on lifestyle changes, only drinking mountain Dew one a day.  Hypertension:  Well controlled  Using medication without problems or lightheadedness:  Chest pain with exertion: Yes, occurs with SOB, [name] than it was. Edema:None Short of breath:Yes Husband has noted she stops breathing at night..cancelled pulm referral for sleep apnea eval because cannot afford CPap  Elevated Cholesterol:on crestor 20 and now on welchol. Cholesteol mistakenly not checked this OV. Using medications without problems: Muscle aches: None Other complaints: None  PMH and SH reviewed.        Review of Systems     Objective:   Physical Exam  Constitutional: Vital signs are normal. She appears well-developed and well-nourished. She is cooperative.  Non-toxic appearance. She does not appear ill. No distress.       Overweight, unkempt  HENT:  Head: Normocephalic.  Right Ear: Hearing, tympanic membrane, external ear and ear canal normal. Tympanic membrane is not erythematous, not retracted and not bulging.  Left Ear: Hearing, tympanic membrane, external ear and ear canal normal.  Tympanic membrane is not erythematous, not retracted and not bulging.  Nose: No mucosal edema or rhinorrhea. Right sinus exhibits no maxillary sinus tenderness and no frontal sinus tenderness. Left sinus exhibits no maxillary sinus tenderness and no frontal sinus tenderness.  Mouth/Throat: Uvula is midline, oropharynx is clear and moist and mucous membranes are normal.  Eyes: Conjunctivae, EOM and lids are normal. Pupils are equal, round, and reactive to light. No foreign bodies found.  Neck: Trachea normal and normal range of motion. Neck supple. Carotid bruit is not present. No mass and no thyromegaly present.  Cardiovascular: Normal rate, regular rhythm, S1 normal, S2 normal, normal heart sounds, intact distal pulses and normal pulses.  Exam reveals no gallop and no friction rub.   No murmur heard. Pulmonary/Chest: Effort normal and breath sounds normal. Not tachypneic. No respiratory distress. She has no decreased breath sounds. She has no wheezes. She has no rhonchi. She has no rales.  Abdominal: Soft. Normal appearance and bowel sounds are normal. There is no tenderness.  Neurological: She is alert.  Skin: Skin is warm, dry and intact. No rash noted.  Psychiatric: Her speech is normal and behavior is normal. Judgment and thought content normal. Her mood appears not anxious. Cognition and memory are normal. She does not exhibit a depressed mood.      Diabetic foot exam: Normal inspection, some ingrown nails, no infection No skin breakdown No calluses  Normal DP pulses Normal sensation to light tough and monofilament Nails normal     Assessment & Plan:

## 2011-04-27 NOTE — Assessment & Plan Note (Addendum)
Will eval with PFTs today. She has quit smoking. PFTs show: no evidence of obstruction in office. She does have symptoms of sleep apnea (needs sleep study). Given unclear cause of dyspnea/chest pain with exertion.. I will refer to pulm for further eval.

## 2011-04-27 NOTE — Assessment & Plan Note (Signed)
Inadequate control despite metformin. Increase to 2 tabs po daily, may need to increase more. Discussed more changes in diet and weight loss.  Exercise somewhat limited by SOB.

## 2011-04-27 NOTE — Patient Instructions (Addendum)
Increase to metformin 2 tabs daily. Continue diet and exercise and weight loss changes.  For now until labs back continue welchol and crestor. STOP at front to speak with MARION.

## 2011-05-01 MED ORDER — METFORMIN HCL ER (MOD) 500 MG PO TB24: 500.0000 mg | ORAL_TABLET | Freq: Two times a day (BID) | ORAL | Status: DC

## 2011-05-01 NOTE — Telephone Encounter (Signed)
Patient advised and will call and make lab appt

## 2011-05-14 ENCOUNTER — Institutional Professional Consult (permissible substitution): Payer: 59 | Admitting: Emergency Medicine

## 2011-06-21 NOTE — Cardiovascular Report (Signed)
  NAME:  [name] [name], [name] [name] NO.:  [id number]  MEDICAL RECORD NO.:  [id number]           PATIENT TYPE:  O  LOCATION:  CATH                         FACILITY:  [facility]  PHYSICIAN:  Marca Ancona, MD      DATE OF BIRTH:  1961/01/10  DATE OF PROCEDURE:  02/20/2011 DATE OF DISCHARGE:                           CARDIAC CATHETERIZATION   PROCEDURES: 1. Left heart catheterization. 2. Coronary angiography. 3. Left ventriculography.  INDICATION:  This is a 49 year old who has atypical chest pain as well as shortness of breath with exertion.  She is also a smoker.  She had an abnormal stress echo.  Therefore, was referred for heart catheterization.  PROCEDURE NOTE:  After informed consent was obtained, the right groin was sterilely prepped and draped with 1% lidocaine.  She was locally anesthetized at the right groin area.  The right common femoral artery was engaged using modified Seldinger technique, and a 4-French arterial sheath was placed.  The left coronary artery was engaged using the JL-4 catheter and the right coronary artery was engaged using the [facility] catheter.  The left ventricle was entered using angled pigtail catheter. There were no complications.  FINDINGS: 1. Hemodynamics:  LV 114/13, aorta 108/60. 2. Left ventriculography:  EF was estimated to be 55% with no wall     motion abnormalities in the RAO projection. 3. Right coronary artery:  The right coronary artery was a dominant     vessel.  There is no angiographic coronary artery disease. 4. Left main:  The left main had no angiographic coronary artery     disease. 5. Left circumflex:  There is no angiographic coronary artery disease. 6. LAD system:  There is no angiographic coronary artery disease.  IMPRESSION:  The patient has no angiographic coronary artery disease. Normal left ventricular end-diastolic pressure and normal left ventricular systolic function.  I suspect the stress echocardiogram was a  false positive and her shortness of breath with exertion may be due to smoking and chronic obstructive pulmonary disease.  The patient was strongly encouraged to stop smoking.     Marca Ancona, MD     DM/MEDQ  D:  02/20/2011  T:  02/21/2011  Job:  [id number]  cc:   Madolyn Frieze. Jens Som, MD, [facility] Kerby Nora, MD  Electronically Signed by Marca Ancona MD on 02/23/2011 09:30:15 PM

## 2011-07-25 ENCOUNTER — Other Ambulatory Visit: Payer: 59

## 2011-07-26 ENCOUNTER — Other Ambulatory Visit (INDEPENDENT_AMBULATORY_CARE_PROVIDER_SITE_OTHER): Payer: 59

## 2011-07-26 DIAGNOSIS — E039 Hypothyroidism, unspecified: Secondary | ICD-10-CM

## 2011-07-26 DIAGNOSIS — E785 Hyperlipidemia, unspecified: Secondary | ICD-10-CM

## 2011-07-26 DIAGNOSIS — E119 Type 2 diabetes mellitus without complications: Secondary | ICD-10-CM

## 2011-07-26 LAB — TSH: TSH: 0.32 u[IU]/mL — ABNORMAL LOW (ref 0.35–5.50)

## 2011-07-26 LAB — LIPID PANEL
Cholesterol: 195 mg/dL (ref 0–200)
HDL: 32 mg/dL — ABNORMAL LOW (ref >39.00)
LDL Cholesterol: 128 mg/dL — ABNORMAL HIGH (ref 0–99)
VLDL: 35.2 mg/dL (ref 0.0–40.0)

## 2011-07-26 NOTE — Progress Notes (Signed)
Addended by: Alvina Chou on: 07/26/2011 09:16 AM   Modules accepted: Orders

## 2011-07-30 ENCOUNTER — Ambulatory Visit (INDEPENDENT_AMBULATORY_CARE_PROVIDER_SITE_OTHER): Payer: 59 | Admitting: Family Medicine

## 2011-07-30 ENCOUNTER — Encounter: Payer: Self-pay | Admitting: Family Medicine

## 2011-07-30 DIAGNOSIS — R0602 Shortness of breath: Secondary | ICD-10-CM

## 2011-07-30 DIAGNOSIS — I10 Essential (primary) hypertension: Secondary | ICD-10-CM

## 2011-07-30 DIAGNOSIS — E039 Hypothyroidism, unspecified: Secondary | ICD-10-CM

## 2011-07-30 DIAGNOSIS — E119 Type 2 diabetes mellitus without complications: Secondary | ICD-10-CM

## 2011-07-30 DIAGNOSIS — J069 Acute upper respiratory infection, unspecified: Secondary | ICD-10-CM

## 2011-07-30 DIAGNOSIS — E785 Hyperlipidemia, unspecified: Secondary | ICD-10-CM

## 2011-07-30 MED ORDER — LEVOTHYROXINE SODIUM 200 MCG PO TABS: ORAL_TABLET | ORAL | Status: DC

## 2011-07-30 MED ORDER — HYDROCODONE-HOMATROPINE 5-1.5 MG/5ML PO SYRP: 5.0000 mL | ORAL_SOLUTION | Freq: Every day | ORAL | Status: AC

## 2011-07-30 MED ORDER — COLESEVELAM HCL 625 MG PO TABS: ORAL_TABLET | ORAL | Status: DC

## 2011-07-30 MED ORDER — GLIPIZIDE ER 5 MG PO TB24: 5.0000 mg | ORAL_TABLET | Freq: Every day | ORAL | Status: DC

## 2011-07-30 MED ORDER — ROSUVASTATIN CALCIUM 40 MG PO TABS: 20.0000 mg | ORAL_TABLET | Freq: Every day | ORAL | Status: DC

## 2011-07-30 NOTE — Patient Instructions (Addendum)
Decrease metformin back to 500 mg daily. Start glucotrol 5 mg daily. Continue work on exercise and low fat low carb diet.  Use cough supressant for viral infection..call if not imprivng in 5-7 days as expected.

## 2011-07-30 NOTE — Assessment & Plan Note (Signed)
Well controlled. Continue current medication. Since slightly overtreated.. Recheck TSH in 3 months.

## 2011-07-30 NOTE — Assessment & Plan Note (Signed)
Well controlled 

## 2011-07-30 NOTE — Assessment & Plan Note (Signed)
IMproved with treatment of thyroid. Continue to follow. Okay to hold on pulm referral at this time.

## 2011-07-30 NOTE — Assessment & Plan Note (Signed)
INadequate control... She is not tolerating higher dose of metformin. Decrease this back to 500 mg daily and add glucotrol .Encouraged exercise, weight loss, healthy eating habits.

## 2011-07-30 NOTE — Assessment & Plan Note (Signed)
Inadequate control but on crestor max and welchol....continue work on lifestyle change. Recheck in 3 months.

## 2011-07-30 NOTE — Assessment & Plan Note (Signed)
Treat symptomatically. Use cough supressant.

## 2011-07-30 NOTE — Progress Notes (Signed)
Subjective:    Patient ID: [name] [name], female    DOB: [birth year], 50 y.o.   MRN: [id number]  HPI  Hypertension:   Well controlled, somewhat low  on no medication currently.  Last appt referred to [facility] for DOE. Cardiac eval in ER negative several months ago.  See last note. She said that since on thyroid med she feels much [name] so has held off on pulmonary referral.  She has had some recent congestion, cough, productive.  Mild sore throat. Ongoing for last week. No fever. Cough keeping her up at night.  Diabetes: Inadequate control . Only on metformin 1000 mg daily. No other med. Lab Results  Component Value Date   HGBA1C 7.6* 07/26/2011  Using medications without difficulties:  Daily nausea with metformin. Hypoglycemic episodes:No Hyperglycemic episodes:Yes Feet problems:None Blood Sugars averaging:FBS 100, 2 hours 199 She has improved diet significantly, but still using a lot of soda and sugar in coffee.  Elevated Cholesterol: LDL not at goal <100 on crestor and welcholo. Using medications without problems: Muscle aches:  Other complaints:  Hypothyroid: Slightly over treated at this point. Will continue on this dose as lower dose had very high TSH. Has had some weight loss, intended. Much [name] faigue and less DOB.    Review of Systems  Constitutional: Negative for fever and fatigue.  HENT: Negative for ear pain.   Eyes: Negative for pain.  Respiratory: Negative for chest tightness and shortness of breath.   Cardiovascular: Negative for chest pain, palpitations and leg swelling.  Gastrointestinal: Negative for abdominal pain.  Genitourinary: Negative for dysuria.       Objective:   Physical Exam  Constitutional: Vital signs are normal. She appears well-developed and well-nourished. She is cooperative.  Non-toxic appearance. She does not appear ill. No distress.  HENT:  Head: Normocephalic.  Right Ear: Hearing, tympanic membrane, external ear and ear canal  normal. Tympanic membrane is not erythematous, not retracted and not bulging.  Left Ear: Hearing, tympanic membrane, external ear and ear canal normal. Tympanic membrane is not erythematous, not retracted and not bulging.  Nose: No mucosal edema or rhinorrhea. Right sinus exhibits no maxillary sinus tenderness and no frontal sinus tenderness. Left sinus exhibits no maxillary sinus tenderness and no frontal sinus tenderness.  Mouth/Throat: Uvula is midline, oropharynx is clear and moist and mucous membranes are normal.  Eyes: Conjunctivae, EOM and lids are normal. Pupils are equal, round, and reactive to light. No foreign bodies found.  Neck: Trachea normal and normal range of motion. Neck supple. Carotid bruit is not present. No mass and no thyromegaly present.  Cardiovascular: Normal rate, regular rhythm, S1 normal, S2 normal, normal heart sounds, intact distal pulses and normal pulses.  Exam reveals no gallop and no friction rub.   No murmur heard. Pulmonary/Chest: Effort normal and breath sounds normal. Not tachypneic. No respiratory distress. She has no decreased breath sounds. She has no wheezes. She has no rhonchi. She has no rales.  Abdominal: Soft. Normal appearance and bowel sounds are normal. There is no tenderness.  Neurological: She is alert.  Skin: Skin is warm, dry and intact. No rash noted.  Psychiatric: Her speech is normal and behavior is normal. Judgment and thought content normal. Her mood appears not anxious. Cognition and memory are normal. She does not exhibit a depressed mood.   Diabetic foot exam: Normal inspection No skin breakdown No calluses  Normal DP pulses Normal sensation to light touch and monofilament Nails normal  Assessment & Plan:

## 2011-08-02 ENCOUNTER — Other Ambulatory Visit: Payer: Self-pay | Admitting: *Deleted

## 2011-08-02 MED ORDER — LEVOTHYROXINE SODIUM 25 MCG PO TABS: ORAL_TABLET | ORAL | Status: DC

## 2011-08-02 MED ORDER — METFORMIN HCL ER (MOD) 500 MG PO TB24: 500.0000 mg | ORAL_TABLET | Freq: Every day | ORAL | Status: DC

## 2011-08-31 LAB — CBC
HCT: 35.6 — ABNORMAL LOW
MCHC: 33.2
MCHC: 33.4
MCV: 80.3
MCV: 81.7
Platelets: 288
Platelets: 366
RDW: 16.5 — ABNORMAL HIGH

## 2011-08-31 LAB — I-STAT 8, (EC8 V) (CONVERTED LAB)
BUN: 11
Bicarbonate: 21.5
HCT: 47 — ABNORMAL HIGH
Hemoglobin: 16 — ABNORMAL HIGH
Operator id: 151321
Potassium: 3.7
Sodium: 140
TCO2: 22

## 2011-08-31 LAB — DIFFERENTIAL
Basophils Absolute: 0
Basophils Relative: 0
Eosinophils Absolute: 0
Neutrophils Relative %: 91 — ABNORMAL HIGH

## 2011-08-31 LAB — COMPREHENSIVE METABOLIC PANEL
Albumin: 2.8 — ABNORMAL LOW
BUN: 7
Chloride: 107
Creatinine, Ser: 0.78
Total Bilirubin: 0.5
Total Protein: 5.6 — ABNORMAL LOW

## 2011-08-31 LAB — LIPID PANEL
LDL Cholesterol: 74
Triglycerides: 115
VLDL: 23

## 2011-08-31 LAB — OCCULT BLOOD X 1 CARD TO LAB, STOOL: Fecal Occult Bld: POSITIVE

## 2011-08-31 LAB — B-NATRIURETIC PEPTIDE (CONVERTED LAB): Pro B Natriuretic peptide (BNP): 102 — ABNORMAL HIGH

## 2011-08-31 LAB — HEMOGLOBIN AND HEMATOCRIT, BLOOD: Hemoglobin: 13.1

## 2011-08-31 LAB — TROPONIN I: Troponin I: 0.01

## 2011-08-31 LAB — SAMPLE TO BLOOD BANK

## 2011-09-03 LAB — STOOL CULTURE

## 2011-09-03 LAB — CBC
HCT: 38.6
Hemoglobin: 13.2
Hemoglobin: 13.9
MCHC: 33.1
MCHC: 33.9
MCHC: 34.1
MCV: 78.7
Platelets: 370
RBC: 4.75
RDW: 16.6 — ABNORMAL HIGH
RDW: 17.2 — ABNORMAL HIGH
RDW: 17.3 — ABNORMAL HIGH

## 2011-09-03 LAB — BASIC METABOLIC PANEL
CO2: 22
Calcium: 8.1 — ABNORMAL LOW
Glucose, Bld: 119 — ABNORMAL HIGH
Sodium: 138

## 2011-09-03 LAB — DIFFERENTIAL
Basophils Relative: 1
Eosinophils Absolute: 0.1
Lymphocytes Relative: 7 — ABNORMAL LOW
Lymphs Abs: 1.2
Monocytes Relative: 1 — ABNORMAL LOW
Monocytes Relative: 6
Neutro Abs: 15.6 — ABNORMAL HIGH
Neutrophils Relative %: 68
Neutrophils Relative %: 92 — ABNORMAL HIGH

## 2011-09-03 LAB — EXPECTORATED SPUTUM ASSESSMENT W GRAM STAIN, RFLX TO RESP C

## 2011-09-03 LAB — URINALYSIS, ROUTINE W REFLEX MICROSCOPIC
Ketones, ur: 15 — AB
Protein, ur: 100 — AB
Urobilinogen, UA: 0.2

## 2011-09-03 LAB — CLOSTRIDIUM DIFFICILE EIA: C difficile Toxins A+B, EIA: NEGATIVE

## 2011-09-03 LAB — APTT: aPTT: 25

## 2011-09-03 LAB — URINE CULTURE
Colony Count: 70000
Colony Count: >=100000
Special Requests: NEGATIVE

## 2011-09-03 LAB — COMPREHENSIVE METABOLIC PANEL
ALT: 15
BUN: 12
Calcium: 9
Creatinine, Ser: 0.81
Glucose, Bld: 187 — ABNORMAL HIGH
Sodium: 137
Total Protein: 7.1

## 2011-09-03 LAB — PROTIME-INR
INR: 1
Prothrombin Time: 13.3

## 2011-09-03 LAB — OVA AND PARASITE EXAMINATION: Ova and parasites: NONE SEEN

## 2011-09-03 LAB — IRON AND TIBC: TIBC: 321

## 2011-09-03 LAB — URINE MICROSCOPIC-ADD ON

## 2011-09-11 LAB — DIFFERENTIAL
Basophils Relative: 1
Lymphs Abs: 1.3
Monocytes Absolute: 0.7
Monocytes Relative: 4
Neutro Abs: 14 — ABNORMAL HIGH

## 2011-09-11 LAB — COMPREHENSIVE METABOLIC PANEL
ALT: 16
Albumin: 4.1
Alkaline Phosphatase: 60
BUN: 15
Calcium: 9.4
Potassium: 3 — ABNORMAL LOW
Sodium: 137
Total Protein: 7.4

## 2011-09-11 LAB — CBC
Platelets: 398
RDW: 18.1 — ABNORMAL HIGH

## 2011-09-20 LAB — URINALYSIS, ROUTINE W REFLEX MICROSCOPIC
Glucose, UA: NEGATIVE
Hgb urine dipstick: NEGATIVE
Protein, ur: NEGATIVE
pH: 6.5

## 2011-10-22 ENCOUNTER — Other Ambulatory Visit: Payer: 59

## 2011-10-25 ENCOUNTER — Ambulatory Visit: Payer: 59 | Admitting: Family Medicine

## 2011-11-05 ENCOUNTER — Other Ambulatory Visit: Payer: Self-pay

## 2011-11-05 ENCOUNTER — Encounter (HOSPITAL_COMMUNITY): Payer: Self-pay | Admitting: Emergency Medicine

## 2011-11-05 ENCOUNTER — Emergency Department (HOSPITAL_COMMUNITY)
Admission: EM | Admit: 2011-11-05 | Discharge: 2011-11-06 | Disposition: A | Discharge status: Home / Self Care | Payer: 59 | Attending: Emergency Medicine | Admitting: Emergency Medicine

## 2011-11-05 DIAGNOSIS — E119 Type 2 diabetes mellitus without complications: Secondary | ICD-10-CM | POA: Insufficient documentation

## 2011-11-05 DIAGNOSIS — R55 Syncope and collapse: Secondary | ICD-10-CM | POA: Insufficient documentation

## 2011-11-05 DIAGNOSIS — E039 Hypothyroidism, unspecified: Secondary | ICD-10-CM | POA: Insufficient documentation

## 2011-11-05 DIAGNOSIS — G43909 Migraine, unspecified, not intractable, without status migrainosus: Secondary | ICD-10-CM

## 2011-11-05 LAB — POCT I-STAT, CHEM 8
BUN: 8 mg/dL (ref 6–23)
Chloride: 106 mEq/L (ref 96–112)
HCT: 44 % (ref 36.0–46.0)
Potassium: 3.9 mEq/L (ref 3.5–5.1)
Sodium: 141 mEq/L (ref 135–145)

## 2011-11-05 LAB — GLUCOSE, CAPILLARY: Glucose-Capillary: 221 mg/dL — ABNORMAL HIGH (ref 70–99)

## 2011-11-05 MED ORDER — HYDROMORPHONE HCL PF 1 MG/ML IJ SOLN: 1.0000 mg | Freq: Once | INTRAMUSCULAR | Status: DC

## 2011-11-05 MED ORDER — HYDROMORPHONE HCL PF 2 MG/ML IJ SOLN
INTRAMUSCULAR | Status: AC
  Administered 2011-11-05: 1 mg
  Filled 2011-11-05: qty 1

## 2011-11-05 MED ORDER — HYDROMORPHONE HCL PF 2 MG/ML IJ SOLN: 1.0000 mg | Freq: Once | INTRAMUSCULAR | Status: DC

## 2011-11-05 MED ORDER — SODIUM CHLORIDE 0.9 % IV BOLUS (SEPSIS)
1000.0000 mL | Freq: Once | INTRAVENOUS | Status: AC
  Administered 2011-11-05: 1000 mL via INTRAVENOUS

## 2011-11-05 MED ORDER — METOCLOPRAMIDE HCL 5 MG/ML IJ SOLN
10.0000 mg | Freq: Once | INTRAMUSCULAR | Status: AC
  Administered 2011-11-05: 10 mg via INTRAVENOUS
  Filled 2011-11-05: qty 2

## 2011-11-05 NOTE — ED Notes (Signed)
As per EMS, pt c/o felt a headache, coughing to the point of n/v. Pt states LOC for a about a min. IV started in route.

## 2011-11-05 NOTE — ED Provider Notes (Signed)
History     CSN: [id number] Arrival date & time: 11/05/2011  8:58 PM   None     Chief Complaint  Patient presents with  . Loss of Consciousness   Chief complaint migraine headache (Consider location/radiation/quality/duration/timing/severity/associated sxs/prior treatment) HPI Patient developed migraine headache approximately one hour ago had a gradual onset typical of migraine she gets approximately every 6 months for several years. Symptoms accompanied by nausea and vomiting also typical of migraine headaches. Upon vomiting patient suffered syncopal event lasting for approximately 1 minute. No treatment prior to coming here. Associated symptoms include photophobia and nausea. Past Medical History  Diagnosis Date  . Osteoporosis   . Glaucoma   . Intermittent vertigo   . B12 deficiency   . Peripheral neuropathy   . GERD (gastroesophageal reflux disease)   . Anxiety   . Diabetes mellitus   . Migraine   . Hyperlipidemia   . Hypothyroidism   . IBS (irritable bowel syndrome)     Past Surgical History  Procedure Date  . Cesarean section 1989  . Tubal ligation     bilateral     Family History  Problem Relation Age of Onset  . Heart failure Mother   . Diabetes Maternal Grandmother   . Alzheimer's disease Maternal Grandmother   . Coronary artery disease Maternal Grandmother   . Heart attack Maternal Grandfather     History  Substance Use Topics  . Smoking status: Former Games developer  . Smokeless tobacco: Not on file   Comment: 1/2 pack per day  . Alcohol Use: Yes    OB History    Grav Para Term Preterm Abortions TAB SAB Ect Mult Living                  Review of Systems  Constitutional: Negative.   HENT: Negative.   Eyes: Positive for photophobia.  Respiratory: Negative.   Cardiovascular: Negative.   Gastrointestinal: Positive for nausea and vomiting.  Musculoskeletal: Negative.   Skin: Negative.   Neurological:       Headache  Hematological: Negative.     Psychiatric/Behavioral: Negative.   All other systems reviewed and are negative.    Allergies  Codeine  Home Medications   Current Outpatient Rx  Name Route Sig Dispense Refill  . ALPRAZOLAM 0.25 MG PO TABS Oral Take 0.25 mg by mouth. 1 tab by mouth daily as needed for panic attacks     . AMITRIPTYLINE HCL 25 MG PO TABS Oral Take 25 mg by mouth at bedtime.     . COLESEVELAM HCL 625 MG PO TABS  5 tabs by mouth daily 150 tablet 11  . DICYCLOMINE HCL 10 MG PO CAPS Oral Take 10 mg by mouth. 1-2 caps by mouth three times a day as needed     . ERGOCALCIFEROL 50000 UNITS PO CAPS Oral Take 50,000 Units by mouth. 1 cap by mouth weekly x 12 weeks     . GLIPIZIDE ER 5 MG PO TB24 Oral Take 1 tablet (5 mg total) by mouth daily. 30 tablet 11  . LEVOTHYROXINE SODIUM 200 MCG PO TABS  One tablet daily, take along with the 25 mg dose. 30 tablet 11  . LEVOTHYROXINE SODIUM 25 MCG PO TABS  1 tab po daily in addition to the 200 mcg tablet 30 tablet 11  . METFORMIN HCL ER (MOD) 500 MG PO TB24 Oral Take 1 tablet (500 mg total) by mouth daily with breakfast. 30 tablet 11  . PROMETHAZINE HCL 25 MG PO  TABS Oral Take 25 mg by mouth as needed.      Marland Kitchen ROSUVASTATIN CALCIUM 40 MG PO TABS Oral Take 0.5 tablets (20 mg total) by mouth daily. 1/2 tab daily 30 tablet 11  . SERTRALINE HCL 50 MG PO TABS Oral Take 50 mg by mouth daily.        BP 137/85  Pulse 92  Temp(Src) 97.7 F (36.5 C) (Oral)  Resp 20  Ht 5\' 1"  (1.549 m)  Wt 160 lb (72.576 kg)  BMI 30.23 kg/m2  SpO2 100%  Physical Exam  Nursing note and vitals reviewed. Constitutional: She appears well-developed and well-nourished. She appears distressed.       Appears uncomfortable Glasgow Coma Score 15  HENT:  Head: Normocephalic and atraumatic.  Eyes: Conjunctivae are normal. Pupils are equal, round, and reactive to light.       Fundi benign  Neck: Neck supple. No tracheal deviation present. No thyromegaly present.  Cardiovascular: Normal rate and  regular rhythm.   No murmur heard. Pulmonary/Chest: Effort normal and breath sounds normal.  Abdominal: Soft. Bowel sounds are normal. She exhibits no distension. There is no tenderness.  Musculoskeletal: Normal range of motion. She exhibits no edema and no tenderness.  Neurological: She is alert. She has normal reflexes. Coordination normal.  Skin: Skin is warm and dry. No rash noted.  Psychiatric: She has a normal mood and affect.    ED Course  Procedures (including critical care time)   Labs Reviewed  POCT CBG MONITORING   No results found.   No diagnosis found.  Date: 11/05/2011  Rate: 80  Rhythm: normal sinus rhythm  QRS Axis: normal  Intervals: normal  ST/T Wave abnormalities: normal  Conduction Disutrbances:none  Narrative Interpretation:   Old EKG Reviewed: unchanged   Unchanged from 01/15/2011  10:30 P.M pain improved and nausea resolved after treatment with intravenous Reglan patient requesting additional pain medicine.  Dilaudid 1 mg IV ordered by me 11:45 PM patient alert and for Glasgow Coma Score 15 feels much improved and ready to go home MDM  Doubt subarachnoid hemorrhage, headache was not thunderclap, history of migraines and pain similar to past migraines, syncope likely secondary to vasovagal result in light of fact the patient actively vomiting when she had syncopal event    Diagnosis #1 migraine headache #2 syncope most likely vasovagal Diagnosis #3 hyperglycemia    Doug Sou, MD 11/05/11 2351

## 2011-11-05 NOTE — ED Notes (Signed)
[id number]<JY> Expected date:<BR> Expected time:<BR> Means of arrival:<BR> Comments:<BR> abd pain/syncopal episode

## 2011-11-06 ENCOUNTER — Other Ambulatory Visit: Payer: 59

## 2011-11-06 NOTE — ED Notes (Signed)
family at bedside.

## 2011-11-08 ENCOUNTER — Ambulatory Visit: Payer: 59 | Admitting: Family Medicine

## 2011-11-12 ENCOUNTER — Other Ambulatory Visit: Payer: 59

## 2011-11-13 ENCOUNTER — Other Ambulatory Visit: Payer: 59

## 2011-11-16 ENCOUNTER — Ambulatory Visit: Payer: 59 | Admitting: Family Medicine

## 2011-11-27 ENCOUNTER — Other Ambulatory Visit (INDEPENDENT_AMBULATORY_CARE_PROVIDER_SITE_OTHER): Payer: 59

## 2011-11-27 DIAGNOSIS — E039 Hypothyroidism, unspecified: Secondary | ICD-10-CM

## 2011-11-27 DIAGNOSIS — E119 Type 2 diabetes mellitus without complications: Secondary | ICD-10-CM

## 2011-11-27 DIAGNOSIS — E785 Hyperlipidemia, unspecified: Secondary | ICD-10-CM

## 2011-11-27 LAB — COMPREHENSIVE METABOLIC PANEL
ALT: 13 U/L (ref 0–35)
BUN: 7 mg/dL (ref 6–23)
CO2: 22 mEq/L (ref 19–32)
Calcium: 8.9 mg/dL (ref 8.4–10.5)
Chloride: 108 mEq/L (ref 96–112)
Creatinine, Ser: 1 mg/dL (ref 0.4–1.2)
GFR: 65.82 mL/min (ref >60.00)
Total Bilirubin: 0.2 mg/dL — ABNORMAL LOW (ref 0.3–1.2)

## 2011-11-27 LAB — LIPID PANEL
Cholesterol: 189 mg/dL (ref 0–200)
HDL: 39.8 mg/dL (ref >39.00)
Triglycerides: 122 mg/dL (ref 0.0–149.0)
VLDL: 24.4 mg/dL (ref 0.0–40.0)

## 2011-11-27 LAB — HEMOGLOBIN A1C: Hgb A1c MFr Bld: 7.4 % — ABNORMAL HIGH (ref 4.6–6.5)

## 2011-11-30 ENCOUNTER — Ambulatory Visit (INDEPENDENT_AMBULATORY_CARE_PROVIDER_SITE_OTHER): Payer: 59 | Admitting: Family Medicine

## 2011-11-30 ENCOUNTER — Encounter: Payer: Self-pay | Admitting: Family Medicine

## 2011-11-30 VITALS — BP 120/78 | HR 86 | Temp 98.1°F | Ht 60.5 in | Wt 155.4 lb

## 2011-11-30 DIAGNOSIS — Z23 Encounter for immunization: Secondary | ICD-10-CM

## 2011-11-30 DIAGNOSIS — I10 Essential (primary) hypertension: Secondary | ICD-10-CM

## 2011-11-30 DIAGNOSIS — F411 Generalized anxiety disorder: Secondary | ICD-10-CM

## 2011-11-30 DIAGNOSIS — G43009 Migraine without aura, not intractable, without status migrainosus: Secondary | ICD-10-CM

## 2011-11-30 DIAGNOSIS — E039 Hypothyroidism, unspecified: Secondary | ICD-10-CM

## 2011-11-30 DIAGNOSIS — E119 Type 2 diabetes mellitus without complications: Secondary | ICD-10-CM

## 2011-11-30 DIAGNOSIS — E785 Hyperlipidemia, unspecified: Secondary | ICD-10-CM

## 2011-11-30 LAB — T3, FREE: T3, Free: 2.6 pg/mL (ref 2.3–4.2)

## 2011-11-30 LAB — T4, FREE: Free T4: 0.63 ng/dL (ref 0.60–1.60)

## 2011-11-30 MED ORDER — SUMATRIPTAN SUCCINATE 100 MG PO TABS: 100.0000 mg | ORAL_TABLET | ORAL | Status: DC | PRN

## 2011-11-30 MED ORDER — PROMETHAZINE HCL 25 MG PO TABS: 25.0000 mg | ORAL_TABLET | ORAL | Status: DC | PRN

## 2011-11-30 MED ORDER — ROSUVASTATIN CALCIUM 40 MG PO TABS: 20.0000 mg | ORAL_TABLET | Freq: Every day | ORAL | Status: DC

## 2011-11-30 MED ORDER — COLESEVELAM HCL 625 MG PO TABS: ORAL_TABLET | ORAL | Status: DC

## 2011-11-30 MED ORDER — METFORMIN HCL ER (MOD) 500 MG PO TB24: 1000.0000 mg | ORAL_TABLET | Freq: Every day | ORAL | Status: DC

## 2011-11-30 MED ORDER — ALPRAZOLAM 0.25 MG PO TABS: 0.2500 mg | ORAL_TABLET | Freq: Every day | ORAL | Status: DC | PRN

## 2011-11-30 MED ORDER — GLIPIZIDE ER 5 MG PO TB24: 5.0000 mg | ORAL_TABLET | Freq: Every day | ORAL | Status: DC

## 2011-11-30 NOTE — Assessment & Plan Note (Signed)
Uncelar why TSh jumped up from overtreated with no med change. Will recheck TSH and free levels.

## 2011-11-30 NOTE — Assessment & Plan Note (Signed)
Stable

## 2011-11-30 NOTE — Assessment & Plan Note (Signed)
Inadequate control.. On max lifestyle changes per pt. Will double metformin. Recheck in 3 months.

## 2011-11-30 NOTE — Patient Instructions (Addendum)
Continue working on exercise, weight loss, and low carb diet. Stop soda all together.  Increase glumetza to 2 tabs daily. Follow blood sugars at home.  Return for fasting labs in 3 months. Next follow up with me in 04/2011 with fasting labs prior as well.  Stop for labs on your way out.

## 2011-11-30 NOTE — Assessment & Plan Note (Addendum)
Well controlled 

## 2011-11-30 NOTE — Assessment & Plan Note (Signed)
Refilled acute meds for treatment.

## 2011-11-30 NOTE — Assessment & Plan Note (Signed)
Inadequate control on max crestor and welchol. Work on lifestyle and further weight loss. Recehck in 3 months.

## 2011-11-30 NOTE — Progress Notes (Signed)
  Subjective:    Patient ID: [name] [name], female    DOB: 10-08-1961, 50 y.o.   MRN: [id number]  HPI  Hypertension:   Well controlled on no medication.  Chest pain with exertion:None Edema:None Short of breath:None Average home BPs:not checking Other issues:  Diabetes: On glumetza, glucotrol Lab Results  Component Value Date   HGBA1C 7.4* 11/27/2011  Has lost 5 lbs...has cut back on soda.  Using medications without difficulties: Hypoglycemic episodes:None Hyperglycemic episodes: Once above 200 Feet problems:None Blood Sugars averaging: FBS 100, 2 hours post prandial 180 eye exam within last year: yes  Hypothyroid:  On synthroid 200 and 25 mcg daily (very compliant)... Has become inadequate control again on same dose of med, not sure why Has noted some return of fatigue, no SOB. Lab Results  Component Value Date   TSH 9.81* 11/27/2011      Elevated Cholesterol:  Not at goal LDL <100 on both crestor and welchol. Using medications without problems:None Muscle aches: None Diet compliance: Moderate Exercise:minimal Other complaints:  Migraine: needs refill of migraine med and nausea med to treat   Review of Systems  Constitutional: Negative for fever and fatigue.  HENT: Negative for ear pain.   Eyes: Negative for pain.  Respiratory: Negative for chest tightness and shortness of breath.   Cardiovascular: Negative for chest pain, palpitations and leg swelling.  Gastrointestinal: Negative for abdominal pain.  Genitourinary: Negative for dysuria.       Objective:   Physical Exam  Constitutional: Vital signs are normal. She appears well-developed and well-nourished. She is cooperative.  Non-toxic appearance. She does not appear ill. No distress.       overweight  HENT:  Head: Normocephalic.  Right Ear: Hearing, tympanic membrane, external ear and ear canal normal. Tympanic membrane is not erythematous, not retracted and not bulging.  Left Ear: Hearing, tympanic  membrane, external ear and ear canal normal. Tympanic membrane is not erythematous, not retracted and not bulging.  Nose: No mucosal edema or rhinorrhea. Right sinus exhibits no maxillary sinus tenderness and no frontal sinus tenderness. Left sinus exhibits no maxillary sinus tenderness and no frontal sinus tenderness.  Mouth/Throat: Uvula is midline, oropharynx is clear and moist and mucous membranes are normal.  Eyes: Conjunctivae, EOM and lids are normal. Pupils are equal, round, and reactive to light. No foreign bodies found.  Neck: Trachea normal and normal range of motion. Neck supple. Carotid bruit is not present. No mass and no thyromegaly present.  Cardiovascular: Normal rate, regular rhythm, S1 normal, S2 normal, normal heart sounds, intact distal pulses and normal pulses.  Exam reveals no gallop and no friction rub.   No murmur heard. Pulmonary/Chest: Effort normal and breath sounds normal. Not tachypneic. No respiratory distress. She has no decreased breath sounds. She has no wheezes. She has no rhonchi. She has no rales.  Abdominal: Soft. Normal appearance and bowel sounds are normal. There is no tenderness.  Neurological: She is alert.  Skin: Skin is warm, dry and intact. No rash noted.  Psychiatric: Her speech is normal and behavior is normal. Judgment and thought content normal. Her mood appears not anxious. Cognition and memory are normal. She does not exhibit a depressed mood.   Diabetic foot exam: Normal inspection No skin breakdown No calluses  Normal DP pulses Normal sensation to light touch and monofilament Nails normal        Assessment & Plan:

## 2012-02-29 ENCOUNTER — Other Ambulatory Visit: Payer: 59

## 2012-05-01 ENCOUNTER — Telehealth: Payer: Self-pay | Admitting: Family Medicine

## 2012-05-01 DIAGNOSIS — E538 Deficiency of other specified B group vitamins: Secondary | ICD-10-CM

## 2012-05-01 DIAGNOSIS — E785 Hyperlipidemia, unspecified: Secondary | ICD-10-CM

## 2012-05-01 DIAGNOSIS — E039 Hypothyroidism, unspecified: Secondary | ICD-10-CM

## 2012-05-01 DIAGNOSIS — E119 Type 2 diabetes mellitus without complications: Secondary | ICD-10-CM

## 2012-05-01 DIAGNOSIS — M810 Age-related osteoporosis without current pathological fracture: Secondary | ICD-10-CM

## 2012-05-01 DIAGNOSIS — I10 Essential (primary) hypertension: Secondary | ICD-10-CM

## 2012-05-01 NOTE — Telephone Encounter (Signed)
Message copied by Excell Seltzer on Thu May 01, 2012  2:09 PM ------      Message from: [place], [place] J      Created: Wed Apr 30, 2012  9:48 AM      Regarding: labs for Fri 5-24       Patient is scheduled for CPX labs, please order future labs, Thanks , Camelia Eng

## 2012-05-02 ENCOUNTER — Other Ambulatory Visit: Payer: 59

## 2012-05-06 ENCOUNTER — Other Ambulatory Visit: Payer: 59

## 2012-05-09 ENCOUNTER — Ambulatory Visit: Payer: 59 | Admitting: Family Medicine

## 2012-05-12 ENCOUNTER — Other Ambulatory Visit: Payer: 59

## 2012-05-13 ENCOUNTER — Other Ambulatory Visit (INDEPENDENT_AMBULATORY_CARE_PROVIDER_SITE_OTHER): Payer: No Typology Code available for payment source

## 2012-05-13 DIAGNOSIS — E785 Hyperlipidemia, unspecified: Secondary | ICD-10-CM

## 2012-05-13 DIAGNOSIS — I10 Essential (primary) hypertension: Secondary | ICD-10-CM

## 2012-05-13 DIAGNOSIS — E538 Deficiency of other specified B group vitamins: Secondary | ICD-10-CM

## 2012-05-13 DIAGNOSIS — E119 Type 2 diabetes mellitus without complications: Secondary | ICD-10-CM

## 2012-05-13 DIAGNOSIS — E039 Hypothyroidism, unspecified: Secondary | ICD-10-CM

## 2012-05-13 DIAGNOSIS — M810 Age-related osteoporosis without current pathological fracture: Secondary | ICD-10-CM

## 2012-05-13 LAB — CBC WITH DIFFERENTIAL/PLATELET
Basophils Relative: 0.2 % (ref 0.0–3.0)
Eosinophils Relative: 1.1 % (ref 0.0–5.0)
HCT: 36 % (ref 36.0–46.0)
Lymphs Abs: 2.9 10*3/uL (ref 0.7–4.0)
Monocytes Relative: 6.1 % (ref 3.0–12.0)
Platelets: 312 10*3/uL (ref 150.0–400.0)
RBC: 4.61 Mil/uL (ref 3.87–5.11)
WBC: 12.6 10*3/uL — ABNORMAL HIGH (ref 4.5–10.5)

## 2012-05-13 LAB — LIPID PANEL
Total CHOL/HDL Ratio: 5
Triglycerides: 210 mg/dL — ABNORMAL HIGH (ref 0.0–149.0)

## 2012-05-13 LAB — LDL CHOLESTEROL, DIRECT: Direct LDL: 117.4 mg/dL

## 2012-05-13 LAB — COMPREHENSIVE METABOLIC PANEL
ALT: 13 U/L (ref 0–35)
AST: 12 U/L (ref 0–37)
Albumin: 3.3 g/dL — ABNORMAL LOW (ref 3.5–5.2)
Alkaline Phosphatase: 58 U/L (ref 39–117)
BUN: 10 mg/dL (ref 6–23)
Calcium: 8.2 mg/dL — ABNORMAL LOW (ref 8.4–10.5)
Chloride: 107 mEq/L (ref 96–112)
Potassium: 3.6 mEq/L (ref 3.5–5.1)
Sodium: 140 mEq/L (ref 135–145)
Total Protein: 6.5 g/dL (ref 6.0–8.3)

## 2012-05-13 LAB — HEMOGLOBIN A1C: Hgb A1c MFr Bld: 7.8 % — ABNORMAL HIGH (ref 4.6–6.5)

## 2012-05-13 LAB — TSH: TSH: 8.43 u[IU]/mL — ABNORMAL HIGH (ref 0.35–5.50)

## 2012-05-14 LAB — VITAMIN D 25 HYDROXY (VIT D DEFICIENCY, FRACTURES): Vit D, 25-Hydroxy: 20 ng/mL — ABNORMAL LOW (ref 30–89)

## 2012-05-16 ENCOUNTER — Ambulatory Visit: Payer: 59 | Admitting: Family Medicine

## 2012-05-20 ENCOUNTER — Ambulatory Visit (INDEPENDENT_AMBULATORY_CARE_PROVIDER_SITE_OTHER): Payer: No Typology Code available for payment source | Admitting: Family Medicine

## 2012-05-20 ENCOUNTER — Encounter: Payer: Self-pay | Admitting: Family Medicine

## 2012-05-20 VITALS — BP 130/70 | HR 90 | Temp 97.9°F | Ht 60.5 in | Wt 155.5 lb

## 2012-05-20 DIAGNOSIS — F411 Generalized anxiety disorder: Secondary | ICD-10-CM

## 2012-05-20 DIAGNOSIS — E119 Type 2 diabetes mellitus without complications: Secondary | ICD-10-CM

## 2012-05-20 DIAGNOSIS — E785 Hyperlipidemia, unspecified: Secondary | ICD-10-CM

## 2012-05-20 DIAGNOSIS — E538 Deficiency of other specified B group vitamins: Secondary | ICD-10-CM

## 2012-05-20 DIAGNOSIS — N920 Excessive and frequent menstruation with regular cycle: Secondary | ICD-10-CM

## 2012-05-20 DIAGNOSIS — E559 Vitamin D deficiency, unspecified: Secondary | ICD-10-CM | POA: Insufficient documentation

## 2012-05-20 DIAGNOSIS — I10 Essential (primary) hypertension: Secondary | ICD-10-CM

## 2012-05-20 DIAGNOSIS — R5383 Other fatigue: Secondary | ICD-10-CM

## 2012-05-20 DIAGNOSIS — E039 Hypothyroidism, unspecified: Secondary | ICD-10-CM

## 2012-05-20 LAB — T3, FREE: T3, Free: 3.3 pg/mL (ref 2.3–4.2)

## 2012-05-20 LAB — HM DIABETES FOOT EXAM

## 2012-05-20 MED ORDER — GLUCOSE BLOOD VI STRP: ORAL_STRIP | Status: DC

## 2012-05-20 MED ORDER — FREESTYLE LANCETS MISC: Status: DC

## 2012-05-20 MED ORDER — GLIPIZIDE ER 10 MG PO TB24: 10.0000 mg | ORAL_TABLET | Freq: Every day | ORAL | Status: DC

## 2012-05-20 MED ORDER — ALPRAZOLAM 0.25 MG PO TABS: 0.2500 mg | ORAL_TABLET | Freq: Every day | ORAL | Status: DC | PRN

## 2012-05-20 MED ORDER — ERGOCALCIFEROL 1.25 MG (50000 UT) PO CAPS: 50000.0000 [IU] | ORAL_CAPSULE | ORAL | Status: DC

## 2012-05-20 MED ORDER — SERTRALINE HCL 50 MG PO TABS: 50.0000 mg | ORAL_TABLET | Freq: Every day | ORAL | Status: DC

## 2012-05-20 NOTE — Assessment & Plan Note (Signed)
Improved on B12 supplementation.

## 2012-05-20 NOTE — Assessment & Plan Note (Signed)
Worsened control...increase glucotrol to max, may need to increase metformin further as well. Encouraged exercise, weight loss, healthy eating habits.

## 2012-05-20 NOTE — Assessment & Plan Note (Signed)
Likely multifactorial.. Anemia, menses heavy, vit D def,  Possible thyroid issues, poor DM control, med SE, deconditioning etc.

## 2012-05-20 NOTE — Assessment & Plan Note (Signed)
Well controlled. Continue current medication.  

## 2012-05-20 NOTE — Assessment & Plan Note (Signed)
Will re-evaluate free t3 and t4.

## 2012-05-20 NOTE — Assessment & Plan Note (Signed)
Worsened slightly.Marland Kitchen Refill zoloft and alprazolam.

## 2012-05-20 NOTE — Assessment & Plan Note (Signed)
Already on max crestor and welchol. Work on lifestyle, info reviewed.

## 2012-05-20 NOTE — Assessment & Plan Note (Signed)
Replete

## 2012-05-20 NOTE — Patient Instructions (Addendum)
Stop at front desk to set up referral to GYN for menstrual issues. Set up yearly eye exam. Increase glucotrol Xl to 10 mg daily. Increase exercise and work on weight loss. Start vit D prescription 50,000 units every week x 12 weeks. After done this start daily OTC vit D 400 IU units daily to twice a day. Can try iron to help with anemia from blood loss from menstruation. Follow up DM in 3 months with labs prior.

## 2012-05-20 NOTE — Progress Notes (Signed)
Hypertension: Well controlled on no medication.  Chest pain with exertion:None  Edema:None  Short of breath:None  Average home BPs:not checking  Other issues:   Diabetes: worsened control despite on doubled dose glumetza, glucotrol  Working on diet control.  Lab Results  Component Value Date   HGBA1C 7.8* 05/13/2012  Using medications without difficulties:  Hypoglycemic episodes:None  Hyperglycemic episodes: Once above 200  Feet problems:None  Blood Sugars averaging: out of strips eye exam within last year: yes   Hypothyroid: On synthroid 200 and 25 mcg daily (very compliant)... Need to follow free T3 qand T 4 levels to adjust. Lab Results  Component Value Date   TSH 8.43* 05/13/2012    Elevated Cholesterol: Improved control but not at goal LDL <100 on both crestor and welchol.  Lab Results  Component Value Date   CHOL 166 05/13/2012   HDL 35.40* 05/13/2012   LDLCALC 125* 11/27/2011   LDLDIRECT 117.4 05/13/2012   TRIG 210.0* 05/13/2012   CHOLHDL 5 05/13/2012    Using medications without problems:None  Muscle aches: None  Diet compliance: Moderate  Exercise:minimal  Other complaints:   Menses are irregular: Heavy flow and some clots. Occurs monthly but lasts 3 to 10 days. Dysmennorhea painful. She is interested in hysterectomy.  Panic attacks are occuring 2 times a month. On alprazolam and zoloft.   Some insomnia mainly due to urination.  Review of Systems  Constitutional: Negative for fever, somefatigue.  HENT: Negative for ear pain.  Eyes: Negative for pain.  Respiratory: Negative for chest tightness and shortness of breath.  Cardiovascular: Negative for chest pain, palpitations and leg swelling.  Gastrointestinal: Negative for abdominal pain.  Genitourinary: Negative for dysuria.    Objective:   Physical Exam  Constitutional: Vital signs are normal. She appears well-developed and well-nourished. She is cooperative. Non-toxic appearance. She does not appear ill. No  distress.  overweight  HENT:  Head: Normocephalic.  Right Ear: Hearing, tympanic membrane, external ear and ear canal normal. Tympanic membrane is not erythematous, not retracted and not bulging.  Left Ear: Hearing, tympanic membrane, external ear and ear canal normal. Tympanic membrane is not erythematous, not retracted and not bulging.  Nose: No mucosal edema or rhinorrhea. Right sinus exhibits no maxillary sinus tenderness and no frontal sinus tenderness. Left sinus exhibits no maxillary sinus tenderness and no frontal sinus tenderness.  Mouth/Throat: Uvula is midline, oropharynx is clear and moist and mucous membranes are normal.  Eyes: Conjunctivae, EOM and lids are normal. Pupils are equal, round, and reactive to light. No foreign bodies found.  Neck: Trachea normal and normal range of motion. Neck supple. Carotid bruit is not present. No mass and no thyromegaly present.  Cardiovascular: Normal rate, regular rhythm, S1 normal, S2 normal, normal heart sounds, intact distal pulses and normal pulses. Exam reveals no gallop and no friction rub.  No murmur heard.  Pulmonary/Chest: Effort normal and breath sounds normal. Not tachypneic. No respiratory distress. She has no decreased breath sounds. She has no wheezes. She has no rhonchi. She has no rales.  Abdominal: Soft. Normal appearance and bowel sounds are normal. There is no tenderness.  Neurological: She is alert.  Skin: Skin is warm, dry and intact. No rash noted.  Psychiatric: Her speech is normal and behavior is normal. Judgment and thought content normal. Her mood appears not anxious. Cognition and memory are normal. She does not exhibit a depressed mood.   Diabetic foot exam:  Normal inspection  No skin breakdown  No calluses  Normal DP pulses  Normal sensation to light touch and monofilament  Nails normal , but cut too short.. Pt instructed to leave longer.

## 2012-05-21 ENCOUNTER — Encounter: Payer: Self-pay | Admitting: *Deleted

## 2012-05-23 ENCOUNTER — Telehealth: Payer: Self-pay

## 2012-05-23 MED ORDER — ONETOUCH ULTRA SYSTEM W/DEVICE KIT: 1.0000 | PACK | Freq: Once | Status: DC

## 2012-05-23 MED ORDER — ONETOUCH LANCETS MISC: Status: DC

## 2012-05-23 MED ORDER — GLUCOSE BLOOD VI STRP: ORAL_STRIP | Status: AC

## 2012-05-23 NOTE — Telephone Encounter (Signed)
Okay to refill as requested.

## 2012-05-23 NOTE — Telephone Encounter (Signed)
done

## 2012-05-23 NOTE — Telephone Encounter (Signed)
[facility] faxed Prior auth for freestyle test strips. Called for PA was told one touch test strips are on preferred list and would not need PA.(pt eligible for one touch meter also). Pt said please send one touch meter,test strip and lancets to [facility].

## 2012-05-27 ENCOUNTER — Telehealth: Payer: Self-pay | Admitting: *Deleted

## 2012-05-27 NOTE — Telephone Encounter (Signed)
Form for diabetic supplies, [facility], is on your desk.

## 2012-08-21 ENCOUNTER — Ambulatory Visit: Payer: No Typology Code available for payment source | Admitting: Family Medicine

## 2012-08-26 ENCOUNTER — Encounter: Payer: Self-pay | Admitting: Family Medicine

## 2012-08-26 ENCOUNTER — Ambulatory Visit (INDEPENDENT_AMBULATORY_CARE_PROVIDER_SITE_OTHER): Payer: No Typology Code available for payment source | Admitting: Family Medicine

## 2012-08-26 VITALS — BP 118/77 | HR 77 | Temp 98.0°F | Ht 61.5 in | Wt 157.2 lb

## 2012-08-26 DIAGNOSIS — R5383 Other fatigue: Secondary | ICD-10-CM

## 2012-08-26 DIAGNOSIS — R5381 Other malaise: Secondary | ICD-10-CM

## 2012-08-26 DIAGNOSIS — E039 Hypothyroidism, unspecified: Secondary | ICD-10-CM

## 2012-08-26 DIAGNOSIS — E119 Type 2 diabetes mellitus without complications: Secondary | ICD-10-CM

## 2012-08-26 DIAGNOSIS — Z23 Encounter for immunization: Secondary | ICD-10-CM

## 2012-08-26 DIAGNOSIS — N920 Excessive and frequent menstruation with regular cycle: Secondary | ICD-10-CM

## 2012-08-26 MED ORDER — METFORMIN HCL ER (MOD) 500 MG PO TB24: 1500.0000 mg | ORAL_TABLET | Freq: Every day | ORAL | Status: DC

## 2012-08-26 MED ORDER — PROMETHAZINE HCL 25 MG PO TABS: 25.0000 mg | ORAL_TABLET | ORAL | Status: DC | PRN

## 2012-08-26 MED ORDER — SUMATRIPTAN SUCCINATE 100 MG PO TABS: 100.0000 mg | ORAL_TABLET | ORAL | Status: DC | PRN

## 2012-08-26 NOTE — Assessment & Plan Note (Signed)
Will re-eval free t3 and free t4. In past her STH is abnormal with normal direct measurement of thyroid levels.

## 2012-08-26 NOTE — Assessment & Plan Note (Signed)
No change. Multifactorial. 

## 2012-08-26 NOTE — Assessment & Plan Note (Signed)
Planned hysterectomy following biopsy. Per GYN Dr. Langston Masker.

## 2012-08-26 NOTE — Progress Notes (Signed)
  Subjective:    Patient ID: [name] [name], female    DOB: [birth year], 51 y.o.   MRN: [id number]  HPI  51 year old female presents for follow up:  At last OV: 05/2012  HYPOTHYROIDISM - Will re-evaluate free t3 and t4.  OTHER MALAISE AND FATIGUE - Kerby Nora, MD 05/20/2012 11:22 AM Signed  Likely multifactorial.. Anemia, menses heavy, vit D def, Possible thyroid issues, poor DM control, med SE, deconditioning etc. HYPERTENSION - Kerby Nora, MD 05/20/2012 11:23 AM Signed  Well controlled. Continue current medication.  HYPERLIPIDEMIA Kerby Nora, MD 05/20/2012 11:23 AM Signed  Already on max crestor and welchol. Work on lifestyle, info reviewed. AODM Kerby Nora, MD 05/20/2012 11:24 AM Signed  Worsened control...increase glucotrol to max, may need to increase metformin further as well. Encouraged exercise, weight loss, healthy eating habits.  ANXIETY DISORDER, GENERALIZED - Kerby Nora, MD 05/20/2012 11:25 AM Signed  Worsened slightly.Marland Kitchen Refill zoloft and alprazolam. Vitamin d deficiency - Kerby Nora, MD 05/20/2012 11:26 AM Signed  Replete. B12 DEFICIENCY - Kerby Nora, MD 05/20/2012 11:26 AM Signed  Improved on B12 supplementation.  Today she reports she has scheduled partial hysterectomy for 10/15, for heavy bleeding, uterine polyps ion [place], has biopsy upcoming to make sure no cancer prior to hysterectomy .  GYN Dr. Langston Masker found TSH elevated recently... Concerned and sent her here.  Free t3 and free t4 in normal range in 05/2012.  Diabetes: Due for a1C recheck. On glucotrol and metformin.  Using medications without difficulties: Hypoglycemic episodes: None Hyperglycemic episodes: 206 2 hours after meals Feet problems:None Blood Sugars averaging: fasting 120, usually 130-206 2 hours  eye exam within last year: overdue  Not due yet for cholesterol check.    Review of Systems     Objective:   Physical Exam        Assessment & Plan:

## 2012-08-26 NOTE — Assessment & Plan Note (Signed)
Inadequate control per pt measurement.. Check A1C. Increase metformin to 3 tabs daily. Recheck in 3 months.

## 2012-08-26 NOTE — Patient Instructions (Addendum)
Increase metformin into 3 tabs daily. Stay on the same glucotrol.  Follow Blood sugars at home on new dose on medicine. Goal fasting 80-100. Call if blood sugars less than 60. Follow up in 3 months with labs prior.

## 2012-08-27 ENCOUNTER — Other Ambulatory Visit: Payer: Self-pay

## 2012-08-27 MED ORDER — LEVOTHYROXINE SODIUM 25 MCG PO TABS: ORAL_TABLET | ORAL | Status: DC

## 2012-08-27 MED ORDER — LEVOTHYROXINE SODIUM 200 MCG PO TABS: ORAL_TABLET | ORAL | Status: DC

## 2012-08-27 NOTE — Telephone Encounter (Signed)
Patient request refill for Levothyroxine 200 mg and 25 mg.11 R. [facility]

## 2012-08-28 ENCOUNTER — Other Ambulatory Visit: Payer: Self-pay | Admitting: Obstetrics & Gynecology

## 2012-09-03 ENCOUNTER — Other Ambulatory Visit: Payer: Self-pay

## 2012-09-09 ENCOUNTER — Encounter (HOSPITAL_COMMUNITY): Payer: Self-pay | Admitting: Pharmacist

## 2012-09-16 ENCOUNTER — Encounter (HOSPITAL_COMMUNITY)
Admission: RE | Admit: 2012-09-16 | Discharge: 2012-09-16 | Disposition: A | Discharge status: Home / Self Care | Payer: No Typology Code available for payment source | Source: Ambulatory Visit | Attending: Obstetrics & Gynecology | Admitting: Obstetrics & Gynecology

## 2012-09-16 ENCOUNTER — Encounter (HOSPITAL_COMMUNITY): Payer: Self-pay

## 2012-09-16 LAB — SURGICAL PCR SCREEN: MRSA, PCR: NEGATIVE

## 2012-09-16 LAB — BASIC METABOLIC PANEL
Calcium: 9.5 mg/dL (ref 8.4–10.5)
Creatinine, Ser: 0.82 mg/dL (ref 0.50–1.10)
GFR calc Af Amer: >90 mL/min (ref >90)
GFR calc non Af Amer: 83 mL/min — ABNORMAL LOW (ref >90)
Sodium: 135 mEq/L (ref 135–145)

## 2012-09-16 LAB — CBC
MCH: 24.2 pg — ABNORMAL LOW (ref 26.0–34.0)
MCHC: 32.3 g/dL (ref 30.0–36.0)
MCV: 74.9 fL — ABNORMAL LOW (ref 78.0–100.0)
Platelets: 382 10*3/uL (ref 150–400)
RBC: 5.42 MIL/uL — ABNORMAL HIGH (ref 3.87–5.11)
RDW: 17.5 % — ABNORMAL HIGH (ref 11.5–15.5)

## 2012-09-16 NOTE — Patient Instructions (Addendum)
[address] [facility]  09/16/2012   Your procedure is scheduled on:  09/23/12  Enter through the Main Entrance of [facility] at 6 AM.  Pick up the phone at the desk and dial [phone].   Call this number if you have problems the morning of surgery: [phone]   Remember:   Do not eat food:After Midnight.  Do not drink clear liquids: After Midnight.  Take these medicines the morning of surgery with A SIP OF WATER: May take Synthroid, hold Metformin 24hrs. Hold Glipizide day of surgery   Do not wear jewelry, make-up or nail polish.  Do not wear lotions, powders, or perfumes. You may wear deodorant.  Do not shave 48 hours prior to surgery.  Do not bring valuables to the hospital.  Contacts, dentures or bridgework may not be worn into surgery.  Leave suitcase in the car. After surgery it may be brought to your room.  For patients admitted to the hospital, checkout time is 11:00 AM the day of discharge.   Patients discharged the day of surgery will not be allowed to drive home.  Name and phone number of your driver: NA  Special Instructions: Shower using CHG 2 nights before surgery and the night before surgery.  If you shower the day of surgery use CHG.  Use special wash - you have one bottle of CHG for all showers.  You should use approximately 1/3 of the bottle for each shower.   Please read over the following fact sheets that you were given: MRSA Information

## 2012-09-22 NOTE — H&P (Signed)
[name] [name] is an 51 y.o. female with menorrhagia and dysmenorrhea desiring definitive management.    Pertinent Gynecological History: Menses: flow is excessive with use of 10 pads or tampons on heaviest days, regular every 28 days without intermenstrual spotting, usually lasting 7 to 10 days and with severe dysmenorrhea Bleeding: dysfunctional uterine bleeding Contraception: tubal ligation DES exposure: unknown Blood transfusions: none Sexually transmitted diseases: no past history Previous GYN Procedures: BTL and C/S  Last mammogram: n/a Last pap: normal Date: n/a OB History: G2, P2   Menstrual History: Menarche age: 83 No LMP recorded.    Past Medical History  Diagnosis Date  . Osteoporosis   . Intermittent vertigo   . B12 deficiency   . Peripheral neuropathy   . GERD (gastroesophageal reflux disease)   . Anxiety   . Diabetes mellitus   . Migraine   . Hyperlipidemia   . Hypothyroidism   . IBS (irritable bowel syndrome)     Past Surgical History  Procedure Date  . Cesarean section 1989  . Tubal ligation     bilateral     Family History  Problem Relation Age of Onset  . Heart failure Mother   . Diabetes Maternal Grandmother   . Alzheimer's disease Maternal Grandmother   . Coronary artery disease Maternal Grandmother   . Heart attack Maternal Grandfather     Social History:  reports that she has quit smoking. She does not have any smokeless tobacco history on file. She reports that she does not drink alcohol or use illicit drugs.  Allergies:  Allergies  Allergen Reactions  . Codeine Other (See Comments)    REACTION: N \\T \ V and hyper    No prescriptions prior to admission    Review of Systems  Constitutional: Negative for fever and chills.  Cardiovascular: Negative for chest pain and palpitations.  Gastrointestinal: Positive for abdominal pain. Negative for nausea, vomiting, diarrhea, constipation and blood in stool.    Height 5' 1.5" (1.562 m),  weight 71.215 kg (157 lb). Physical Exam  Constitutional: She is oriented to person, place, and time. She appears well-developed and well-nourished.  HENT:  Head: Normocephalic and atraumatic.  Neck: Normal range of motion. Neck supple. No thyromegaly present.  Cardiovascular: Normal rate, regular rhythm and normal heart sounds.   Respiratory: Effort normal and breath sounds normal. No respiratory distress. She has no wheezes.  GI: Soft. Bowel sounds are normal. She exhibits no distension. There is no tenderness. There is no rebound and no guarding.  Genitourinary:       Enlarged uterus (approximately 8 weeks sized.   Musculoskeletal: Normal range of motion. She exhibits no edema and no tenderness.  Neurological: She is alert and oriented to person, place, and time.  Skin: Skin is warm and dry.  Psychiatric: She has a normal mood and affect. Her behavior is normal.    No results found for this or any previous visit (from the past 24 hour(s)).  No results found.  Assessment/Plan: 51 yo G2P2 with menorrhagia and dysmenorrhea. Plan for LAVH with ovarian preservation.   Celvin Taney 09/22/2012, 8:59 PM

## 2012-09-23 ENCOUNTER — Observation Stay (HOSPITAL_COMMUNITY)
Admission: RE | Admit: 2012-09-23 | Discharge: 2012-09-25 | Disposition: A | Discharge status: Home / Self Care | Payer: No Typology Code available for payment source | Source: Ambulatory Visit | Attending: Obstetrics & Gynecology | Admitting: Obstetrics & Gynecology

## 2012-09-23 ENCOUNTER — Encounter (HOSPITAL_COMMUNITY): Payer: Self-pay | Admitting: Anesthesiology

## 2012-09-23 ENCOUNTER — Encounter (HOSPITAL_COMMUNITY): Payer: Self-pay | Admitting: *Deleted

## 2012-09-23 ENCOUNTER — Ambulatory Visit (HOSPITAL_COMMUNITY): Payer: No Typology Code available for payment source | Admitting: Anesthesiology

## 2012-09-23 ENCOUNTER — Encounter (HOSPITAL_COMMUNITY): Payer: Self-pay

## 2012-09-23 ENCOUNTER — Encounter (HOSPITAL_COMMUNITY)
Admission: RE | Disposition: A | Discharge status: Home / Self Care | Payer: Self-pay | Source: Ambulatory Visit | Attending: Obstetrics & Gynecology

## 2012-09-23 DIAGNOSIS — D251 Intramural leiomyoma of uterus: Secondary | ICD-10-CM | POA: Insufficient documentation

## 2012-09-23 DIAGNOSIS — N8000 Endometriosis of the uterus, unspecified: Secondary | ICD-10-CM | POA: Insufficient documentation

## 2012-09-23 DIAGNOSIS — N946 Dysmenorrhea, unspecified: Secondary | ICD-10-CM | POA: Insufficient documentation

## 2012-09-23 DIAGNOSIS — N920 Excessive and frequent menstruation with regular cycle: Principal | ICD-10-CM | POA: Insufficient documentation

## 2012-09-23 HISTORY — PX: LAPAROSCOPIC ASSISTED VAGINAL HYSTERECTOMY: SHX5398

## 2012-09-23 LAB — GLUCOSE, CAPILLARY: Glucose-Capillary: 172 mg/dL — ABNORMAL HIGH (ref 70–99)

## 2012-09-23 LAB — ABO/RH: ABO/RH(D): B NEG

## 2012-09-23 SURGERY — HYSTERECTOMY, VAGINAL, LAPAROSCOPY-ASSISTED
Anesthesia: General | Site: Abdomen | Wound class: Clean Contaminated

## 2012-09-23 MED ORDER — HYDROMORPHONE HCL PF 1 MG/ML IJ SOLN
1.0000 mg | INTRAMUSCULAR | Status: DC | PRN
  Administered 2012-09-23 – 2012-09-24 (×3): 1 mg via INTRAVENOUS
  Filled 2012-09-23 (×3): qty 1

## 2012-09-23 MED ORDER — BUPIVACAINE HCL (PF) 0.25 % IJ SOLN
INTRAMUSCULAR | Status: DC | PRN
  Administered 2012-09-23: 10 mL

## 2012-09-23 MED ORDER — HYDROMORPHONE HCL PF 1 MG/ML IJ SOLN
INTRAMUSCULAR | Status: AC
  Filled 2012-09-23: qty 1

## 2012-09-23 MED ORDER — HYDROMORPHONE HCL PF 1 MG/ML IJ SOLN
0.2500 mg | INTRAMUSCULAR | Status: DC | PRN
  Administered 2012-09-23 (×4): 0.5 mg via INTRAVENOUS

## 2012-09-23 MED ORDER — PROCHLORPERAZINE EDISYLATE 5 MG/ML IJ SOLN
10.0000 mg | Freq: Four times a day (QID) | INTRAMUSCULAR | Status: DC | PRN
  Filled 2012-09-23: qty 2

## 2012-09-23 MED ORDER — NEOSTIGMINE METHYLSULFATE 1 MG/ML IJ SOLN
INTRAMUSCULAR | Status: AC
  Filled 2012-09-23: qty 10

## 2012-09-23 MED ORDER — KETOROLAC TROMETHAMINE 30 MG/ML IJ SOLN
INTRAMUSCULAR | Status: DC | PRN
  Administered 2012-09-23: 60 mg via INTRAVENOUS

## 2012-09-23 MED ORDER — GLYCOPYRROLATE 0.2 MG/ML IJ SOLN
INTRAMUSCULAR | Status: AC
  Filled 2012-09-23: qty 2

## 2012-09-23 MED ORDER — FENTANYL CITRATE 0.05 MG/ML IJ SOLN
INTRAMUSCULAR | Status: DC | PRN
  Administered 2012-09-23 (×2): 50 ug via INTRAVENOUS
  Administered 2012-09-23: 100 ug via INTRAVENOUS
  Administered 2012-09-23 (×5): 50 ug via INTRAVENOUS

## 2012-09-23 MED ORDER — KETOROLAC TROMETHAMINE 30 MG/ML IJ SOLN: 15.0000 mg | Freq: Once | INTRAMUSCULAR | Status: DC | PRN

## 2012-09-23 MED ORDER — MIDAZOLAM HCL 5 MG/5ML IJ SOLN
INTRAMUSCULAR | Status: DC | PRN
  Administered 2012-09-23: 2 mg via INTRAVENOUS

## 2012-09-23 MED ORDER — ONDANSETRON HCL 4 MG/2ML IJ SOLN
INTRAMUSCULAR | Status: AC
  Filled 2012-09-23: qty 2

## 2012-09-23 MED ORDER — ONDANSETRON HCL 4 MG/2ML IJ SOLN
INTRAMUSCULAR | Status: DC | PRN
  Administered 2012-09-23: 4 mg via INTRAVENOUS

## 2012-09-23 MED ORDER — MIDAZOLAM HCL 2 MG/2ML IJ SOLN
INTRAMUSCULAR | Status: AC
  Filled 2012-09-23: qty 2

## 2012-09-23 MED ORDER — ONDANSETRON HCL 4 MG/2ML IJ SOLN
4.0000 mg | Freq: Four times a day (QID) | INTRAMUSCULAR | Status: DC | PRN
  Administered 2012-09-23 – 2012-09-24 (×2): 4 mg via INTRAVENOUS
  Filled 2012-09-23 (×2): qty 2

## 2012-09-23 MED ORDER — PANTOPRAZOLE SODIUM 40 MG PO TBEC
40.0000 mg | DELAYED_RELEASE_TABLET | Freq: Once | ORAL | Status: AC
  Administered 2012-09-23: 40 mg via ORAL

## 2012-09-23 MED ORDER — NEOSTIGMINE METHYLSULFATE 1 MG/ML IJ SOLN
INTRAMUSCULAR | Status: DC | PRN
  Administered 2012-09-23: 2 mg via INTRAVENOUS

## 2012-09-23 MED ORDER — ROCURONIUM BROMIDE 100 MG/10ML IV SOLN
INTRAVENOUS | Status: DC | PRN
  Administered 2012-09-23: 5 mg via INTRAVENOUS
  Administered 2012-09-23: 35 mg via INTRAVENOUS
  Administered 2012-09-23: 10 mg via INTRAVENOUS

## 2012-09-23 MED ORDER — LIDOCAINE HCL (CARDIAC) 20 MG/ML IV SOLN
INTRAVENOUS | Status: AC
  Filled 2012-09-23: qty 5

## 2012-09-23 MED ORDER — FENTANYL CITRATE 0.05 MG/ML IJ SOLN
INTRAMUSCULAR | Status: AC
  Filled 2012-09-23: qty 5

## 2012-09-23 MED ORDER — SERTRALINE HCL 50 MG PO TABS
50.0000 mg | ORAL_TABLET | Freq: Every day | ORAL | Status: DC
  Administered 2012-09-23 – 2012-09-24 (×2): 50 mg via ORAL
  Filled 2012-09-23 (×3): qty 1

## 2012-09-23 MED ORDER — GLYCOPYRROLATE 0.2 MG/ML IJ SOLN
INTRAMUSCULAR | Status: DC | PRN
  Administered 2012-09-23: 0.4 mg via INTRAVENOUS

## 2012-09-23 MED ORDER — ALPRAZOLAM 0.25 MG PO TABS: 0.2500 mg | ORAL_TABLET | Freq: Three times a day (TID) | ORAL | Status: DC | PRN

## 2012-09-23 MED ORDER — PANTOPRAZOLE SODIUM 40 MG PO TBEC
DELAYED_RELEASE_TABLET | ORAL | Status: AC
  Filled 2012-09-23: qty 1

## 2012-09-23 MED ORDER — SIMETHICONE 80 MG PO CHEW: 80.0000 mg | CHEWABLE_TABLET | Freq: Four times a day (QID) | ORAL | Status: DC | PRN

## 2012-09-23 MED ORDER — DOCUSATE SODIUM 100 MG PO CAPS
100.0000 mg | ORAL_CAPSULE | Freq: Two times a day (BID) | ORAL | Status: DC
  Administered 2012-09-23 – 2012-09-24 (×2): 100 mg via ORAL
  Filled 2012-09-23 (×2): qty 1

## 2012-09-23 MED ORDER — BUPIVACAINE HCL (PF) 0.25 % IJ SOLN
INTRAMUSCULAR | Status: AC
  Filled 2012-09-23: qty 30

## 2012-09-23 MED ORDER — KETOROLAC TROMETHAMINE 60 MG/2ML IM SOLN
INTRAMUSCULAR | Status: AC
  Filled 2012-09-23: qty 2

## 2012-09-23 MED ORDER — LACTATED RINGERS IV SOLN
INTRAVENOUS | Status: DC
  Administered 2012-09-23: 09:00:00 via INTRAVENOUS
  Administered 2012-09-23: 1000 mL via INTRAVENOUS
  Administered 2012-09-23 (×3): via INTRAVENOUS

## 2012-09-23 MED ORDER — PROPOFOL 10 MG/ML IV EMUL
INTRAVENOUS | Status: AC
  Filled 2012-09-23: qty 20

## 2012-09-23 MED ORDER — LEVOTHYROXINE SODIUM 200 MCG PO TABS
200.0000 ug | ORAL_TABLET | Freq: Every day | ORAL | Status: DC
  Administered 2012-09-24: 200 ug via ORAL
  Filled 2012-09-23 (×4): qty 1

## 2012-09-23 MED ORDER — HYDROMORPHONE HCL PF 1 MG/ML IJ SOLN
INTRAMUSCULAR | Status: DC | PRN
  Administered 2012-09-23: 1 mg via INTRAVENOUS

## 2012-09-23 MED ORDER — DEXTROSE IN LACTATED RINGERS 5 % IV SOLN
INTRAVENOUS | Status: DC
  Administered 2012-09-23 (×2): via INTRAVENOUS

## 2012-09-23 MED ORDER — DEXAMETHASONE SODIUM PHOSPHATE 10 MG/ML IJ SOLN
INTRAMUSCULAR | Status: AC
  Filled 2012-09-23: qty 1

## 2012-09-23 MED ORDER — LEVOTHYROXINE SODIUM 25 MCG PO TABS
25.0000 ug | ORAL_TABLET | Freq: Every day | ORAL | Status: DC
  Administered 2012-09-24: 25 ug via ORAL
  Filled 2012-09-23 (×4): qty 1

## 2012-09-23 MED ORDER — IBUPROFEN 800 MG PO TABS: 800.0000 mg | ORAL_TABLET | Freq: Three times a day (TID) | ORAL | Status: DC | PRN

## 2012-09-23 MED ORDER — CEFAZOLIN SODIUM-DEXTROSE 2-3 GM-% IV SOLR
2.0000 g | INTRAVENOUS | Status: AC
  Administered 2012-09-23: 2 g via INTRAVENOUS

## 2012-09-23 MED ORDER — CEFAZOLIN SODIUM-DEXTROSE 2-3 GM-% IV SOLR
INTRAVENOUS | Status: AC
  Filled 2012-09-23: qty 50

## 2012-09-23 MED ORDER — ONDANSETRON HCL 4 MG PO TABS
4.0000 mg | ORAL_TABLET | Freq: Four times a day (QID) | ORAL | Status: DC | PRN
  Administered 2012-09-24: 4 mg via ORAL
  Filled 2012-09-23: qty 1

## 2012-09-23 MED ORDER — ZOLPIDEM TARTRATE 5 MG PO TABS: 5.0000 mg | ORAL_TABLET | Freq: Every evening | ORAL | Status: DC | PRN

## 2012-09-23 MED ORDER — ROCURONIUM BROMIDE 50 MG/5ML IV SOLN
INTRAVENOUS | Status: AC
  Filled 2012-09-23: qty 1

## 2012-09-23 MED ORDER — LIDOCAINE HCL (CARDIAC) 20 MG/ML IV SOLN
INTRAVENOUS | Status: DC | PRN
  Administered 2012-09-23: 75 mg via INTRAVENOUS

## 2012-09-23 MED ORDER — METOCLOPRAMIDE HCL 5 MG/ML IJ SOLN
10.0000 mg | Freq: Four times a day (QID) | INTRAMUSCULAR | Status: DC
  Administered 2012-09-23 – 2012-09-24 (×3): 10 mg via INTRAVENOUS
  Filled 2012-09-23 (×3): qty 2

## 2012-09-23 MED ORDER — PROPOFOL 10 MG/ML IV EMUL
INTRAVENOUS | Status: DC | PRN
  Administered 2012-09-23: 150 mg via INTRAVENOUS

## 2012-09-23 SURGICAL SUPPLY — 38 items
CANISTER SUCTION 2500CC (MISCELLANEOUS) ×2 IMPLANT
CATH ROBINSON RED A/P 16FR (CATHETERS) ×2 IMPLANT
CHLORAPREP W/TINT 26ML (MISCELLANEOUS) ×4 IMPLANT
CLOTH BEACON ORANGE TIMEOUT ST (SAFETY) ×2 IMPLANT
COVER LIGHT HANDLE  1/PK (MISCELLANEOUS) ×2
COVER LIGHT HANDLE 1/PK (MISCELLANEOUS) ×2 IMPLANT
COVER TABLE BACK 60X90 (DRAPES) ×2 IMPLANT
DERMABOND ADVANCED (GAUZE/BANDAGES/DRESSINGS) ×1
DERMABOND ADVANCED .7 DNX12 (GAUZE/BANDAGES/DRESSINGS) ×1 IMPLANT
ELECT LIGASURE LONG (ELECTRODE) ×2 IMPLANT
ELECT LIGASURE SHORT 9 REUSE (ELECTRODE) ×2 IMPLANT
ELECT REM PT RETURN 9FT ADLT (ELECTROSURGICAL) ×2
ELECTRODE REM PT RTRN 9FT ADLT (ELECTROSURGICAL) ×1 IMPLANT
FORCEPS CUTTING 45CM 5MM (CUTTING FORCEPS) ×2 IMPLANT
GLOVE BIO SURGEON STRL SZ 6 (GLOVE) ×4 IMPLANT
GLOVE BIOGEL PI IND STRL 6 (GLOVE) ×2 IMPLANT
GLOVE BIOGEL PI INDICATOR 6 (GLOVE) ×2
GOWN PREVENTION PLUS LG XLONG (DISPOSABLE) ×8 IMPLANT
NEEDLE INSUFFLATION 14GA 120MM (NEEDLE) ×2 IMPLANT
NS IRRIG 1000ML POUR BTL (IV SOLUTION) ×2 IMPLANT
PACK LAVH (CUSTOM PROCEDURE TRAY) ×2 IMPLANT
PROTECTOR NERVE ULNAR (MISCELLANEOUS) ×4 IMPLANT
SEALER TISSUE G2 CVD JAW 45CM (ENDOMECHANICALS) ×2 IMPLANT
SLEEVE Z-THREAD 5X100MM (TROCAR) IMPLANT
SUT MNCRL 0 MO-4 VIOLET 18 CR (SUTURE) ×2 IMPLANT
SUT MON AB 2-0 CT1 36 (SUTURE) ×2 IMPLANT
SUT MONOCRYL 0 MO 4 18  CR/8 (SUTURE) ×2
SUT VIC AB 3-0 PS2 18 (SUTURE) ×1
SUT VIC AB 3-0 PS2 18XBRD (SUTURE) ×1 IMPLANT
SUT VICRYL 0 TIES 12 18 (SUTURE) ×2 IMPLANT
SUT VICRYL 0 UR6 27IN ABS (SUTURE) ×2 IMPLANT
TOWEL OR 17X24 6PK STRL BLUE (TOWEL DISPOSABLE) ×4 IMPLANT
TRAY FOLEY CATH 14FR (SET/KITS/TRAYS/PACK) ×2 IMPLANT
TROCAR XCEL 12X100 BLDLESS (ENDOMECHANICALS) IMPLANT
TROCAR Z-THREAD BLADED 11X100M (TROCAR) ×2 IMPLANT
TROCAR Z-THREAD BLADED 5X100MM (TROCAR) ×2 IMPLANT
TROCAR Z-THREAD FIOS 11X100 BL (TROCAR) ×2 IMPLANT
WARMER LAPAROSCOPE (MISCELLANEOUS) ×2 IMPLANT

## 2012-09-23 NOTE — Transfer of Care (Signed)
Immediate Anesthesia Transfer of Care Note  Patient: [name] [name]  Procedure(s) Performed: Procedure(s) (LRB) with comments: LAPAROSCOPIC ASSISTED VAGINAL HYSTERECTOMY (N/A) - pull Dr Vance Gather instrument  Patient Location: PACU  Anesthesia Type: General  Level of Consciousness: awake, oriented and patient cooperative  Airway & Oxygen Therapy: Patient Spontanous Breathing and Patient connected to nasal cannula oxygen  Post-op Assessment: Report given to PACU RN and Post -op Vital signs reviewed and stable  Post vital signs: Reviewed and stable  Complications: No apparent anesthesia complications

## 2012-09-23 NOTE — Anesthesia Postprocedure Evaluation (Signed)
Anesthesia Post Note  Patient: [name] [name]  Procedure(s) Performed: Procedure(s) (LRB): LAPAROSCOPIC ASSISTED VAGINAL HYSTERECTOMY (N/A)  Anesthesia type: General  Patient location: PACU  Post pain: Pain level controlled  Post assessment: Post-op Vital signs reviewed  Last Vitals:  Filed Vitals:   09/23/12 0953  BP:   Pulse:   Temp: 36.9 C  Resp: 15    Post vital signs: Reviewed  Level of consciousness: sedated  Complications: No apparent anesthesia complicationsfj

## 2012-09-23 NOTE — Progress Notes (Signed)
-  NOS- C/o nausea s/p ambulation to the hallway.  Good pain control.  No chest pain or SOB.  Foley in.  Tolerating sips and chips. VSS. AF.  UOP ~50cc/hr, clear yellow Gen: NAD Abd: soft, ND, inc c/d/i x 2.   Ext: no c/c/e, SCDs on. 51yo POD#0 s/p LAVH Will improve nausea control.  Added Compazine and Reglan.   Decreased IVF to 75cc/hr. DM-will watch fsbs and tx with sliding scale if >200. AM labs pending.  Mitchel Honour, DO

## 2012-09-23 NOTE — Anesthesia Preprocedure Evaluation (Signed)
Anesthesia Evaluation  Patient identified by MRN, date of birth, ID band Patient awake    Reviewed: Allergy & Precautions, H&P , NPO status , Patient's Chart, lab work & pertinent test results, reviewed documented beta blocker date and time   History of Anesthesia Complications Negative for: history of anesthetic complications  Airway Mallampati: III TM Distance: <3 FB Neck ROM: full    Dental  (+) Poor Dentition   Pulmonary former smoker (quit 1 year ago),  breath sounds clear to auscultation        Cardiovascular Exercise Tolerance: Good Rhythm:regular Rate:Normal  hyperlipidemia   Neuro/Psych  Headaches (every other month), PSYCHIATRIC DISORDERS (anxiety)    GI/Hepatic Neg liver ROS, GERD-  ,  Endo/Other  diabetes, Type 2, Oral Hypoglycemic AgentsHypothyroidism   Renal/GU negative Renal ROS     Musculoskeletal   Abdominal   Peds  Hematology negative hematology ROS (+)   Anesthesia Other Findings   Reproductive/Obstetrics (+) Pregnancy                           Anesthesia Physical Anesthesia Plan  ASA: III  Anesthesia Plan: General ETT   Post-op Pain Management:    Induction:   Airway Management Planned:   Additional Equipment:   Intra-op Plan:   Post-operative Plan:   Informed Consent: I have reviewed the patients History and Physical, chart, labs and discussed the procedure including the risks, benefits and alternatives for the proposed anesthesia with the patient or authorized representative who has indicated his/her understanding and acceptance.   Dental Advisory Given  Plan Discussed with: Surgeon and CRNA  Anesthesia Plan Comments:         Anesthesia Quick Evaluation

## 2012-09-23 NOTE — Op Note (Signed)
Alisah C Steinmetz PROCEDURE DATE: 09/23/2012  PREOPERATIVE DIAGNOSIS: Menorrhagia, dysmenorrhea POSTOPERATIVE DIAGNOSIS: The same PROCEDURE: Laparoscopic Assisted Vaginal Hysterectomy SURGEON:  Dr. Mitchel Honour ASSISTANT: Dr. Candice Camp  INDICATIONS: 51 y.o. G2P2 with menorrhagia and dysmenorrhea desiring definitive surgical management.  Risks of surgery were discussed with the patient including but not limited to: bleeding which may require transfusion or reoperation; infection which may require antibiotics; injury to bowel, bladder, ureters or other surrounding organs; need for additional procedures including laparotomy; thromboembolic phenomenon, incisional problems and other postoperative/anesthesia complications. Written informed consent was obtained.    FINDINGS:  Small uterus, normal adnexa bilaterally.  No evidence of endometriosis.  Normal upper abdomen.  Adhesion of omentum to left adnexa.  ANESTHESIA:    General ESTIMATED BLOOD LOSS: 450 ml SPECIMENS: Uterus and cervix COMPLICATIONS: None immediate  PROCEDURE IN DETAIL:  The patient received intravenous antibiotics and had sequential compression devices applied to her lower extremities while in the preoperative area.  She was then taken to the operating room where general anesthesia was administered and was found to be adequate.  She was placed in the dorsal lithotomy position, and was prepped and draped in a sterile manner.  An in and out catheterization was performed. A uterine manipulator was then advanced into the uterus .  After an adequate timeout was performed, attention was then turned to the patient's abdomen where a 10-mm skin incision was made in the umbilical fold.  The Veress needle was carefully introduced into the peritoneal cavity through the abdominal wall.  Intraperitoneal placement was confirmed by drop in intraabdominal pressure with insufflation of carbon dioxide gas.  Adequate pneumoperitoneum was obtained, and the  10/11 XL trocar and sleeve were then advanced without difficulty into the abdomen where intraabdominal placement was confirmed by the laparoscope. A survey of the patient's pelvis and abdomen revealed entirely normal anatomy with exception of a small adhesion of the omentum to the left adnexa.  Suprapubic 5 XL port was then placed under direct visualization.  The pelvis was then carefully examined.   On the right side, the round ligament was then clamped and transected with the Gyrus.  The uteroovarian ligament was also clamped and transected.  The leaves of the broad ligament were separated and serially transected. A bladder flap was createded. These procedures were then repeated on the left side after the small omental adhesion to the left adnexa was taken down using the Gyrus.  The ureters were noted to be safely away from the area of dissection.  At this point, attention was turned to the vaginal portion of the case. A weighted speculum was placed posteriorly, a Deaver anteriorly, and the cervix grasped with a thyroid tenaculum. Once the anterior and posterior reflections were identified, the cervix was circumscribed using the Bovie knife. Next, using Mayos, the posterior cul-de-sac was entered.  The LigaSure was then used to grasp the uterosacrals which were coapted and cut. Next, the bladder reflection was identified. Using Metzenbaums, it was entered and palpation and direct visualization confirmed proper location. Next, using the LigaSure, the uterine arteries were coapted and cut bilaterally. The pedicles were visualized after coaptation and were hemostatic.  The same was performed sequentially cephalad until the uterus and cervix were removed.  The pedicles were inspected and found to be hemostatic.  Next, the uterosacrals were tagged with monocryl bilaterally.  The posterior peritoneum was closed using monocryl in a purse-string fashion.  The uterosacrals were brought together in the midline cuff  closure with a  figure-of-8 stitch using monocryl followed by the remainder of the cuff closure in the same fashion.  The cuff was inspected and found to be hemostatic.  Attention was returned to the abdomen were a second laparoscopic look was taken.  All pedicles were hemostatic.  There were a few oozing vessels near the bladder flap that were rendered hemostatic using the Gyrus.  Insufflation was removed after all instruments were removed.    All skin incisions were closed with 4-0 Vicryl subcuticular stitches and Dermabond. The patient tolerated the procedures well.  All instruments, needles, and sponge counts were correct x 2. The patient was taken to the recovery room awake, extubated and in stable condition.

## 2012-09-23 NOTE — Preoperative (Signed)
Beta Blockers   Reason not to administer Beta Blockers:Not Applicable 

## 2012-09-23 NOTE — Progress Notes (Signed)
Update to H&P  No medical history or medication changes.    Took Synthroid this AM.  Questions answered.    Mitchel Honour, DO

## 2012-09-24 ENCOUNTER — Encounter (HOSPITAL_COMMUNITY): Payer: Self-pay | Admitting: Obstetrics & Gynecology

## 2012-09-24 LAB — GLUCOSE, CAPILLARY: Glucose-Capillary: 113 mg/dL — ABNORMAL HIGH (ref 70–99)

## 2012-09-24 LAB — CBC
HCT: 30.7 % — ABNORMAL LOW (ref 36.0–46.0)
Hemoglobin: 9.8 g/dL — ABNORMAL LOW (ref 12.0–15.0)
MCH: 24.3 pg — ABNORMAL LOW (ref 26.0–34.0)
MCHC: 31.9 g/dL (ref 30.0–36.0)
RDW: 17.3 % — ABNORMAL HIGH (ref 11.5–15.5)

## 2012-09-24 MED ORDER — KETOROLAC TROMETHAMINE 30 MG/ML IJ SOLN
30.0000 mg | Freq: Once | INTRAMUSCULAR | Status: AC
  Administered 2012-09-24: 30 mg via INTRAVENOUS
  Filled 2012-09-24: qty 1

## 2012-09-24 MED ORDER — SCOPOLAMINE 1 MG/3DAYS TD PT72
1.0000 | MEDICATED_PATCH | TRANSDERMAL | Status: DC
  Administered 2012-09-24: 1.5 mg via TRANSDERMAL
  Filled 2012-09-24: qty 1

## 2012-09-24 MED ORDER — IBUPROFEN 800 MG PO TABS: 800.0000 mg | ORAL_TABLET | Freq: Three times a day (TID) | ORAL | Status: DC

## 2012-09-24 MED ORDER — IBUPROFEN 800 MG PO TABS
800.0000 mg | ORAL_TABLET | Freq: Three times a day (TID) | ORAL | Status: DC
  Administered 2012-09-24: 800 mg via ORAL
  Filled 2012-09-24 (×2): qty 1

## 2012-09-24 MED ORDER — OXYCODONE HCL 5 MG PO TABS: 5.0000 mg | ORAL_TABLET | ORAL | Status: DC | PRN

## 2012-09-24 MED ORDER — OXYCODONE-ACETAMINOPHEN 5-325 MG PO TABS
1.0000 | ORAL_TABLET | ORAL | Status: DC | PRN
  Administered 2012-09-24 (×2): 2 via ORAL
  Administered 2012-09-25: 1 via ORAL
  Filled 2012-09-24: qty 2
  Filled 2012-09-24: qty 1
  Filled 2012-09-24: qty 2

## 2012-09-24 NOTE — Progress Notes (Signed)
Ur chart review completed.  

## 2012-09-24 NOTE — Progress Notes (Addendum)
Continues to c/o nausea and vomiting with movement/ambulation.  Tolerating clears otherwise.  Adequate pain control with Dilaudid.  Foley in.  No CP/SOB.  No Flatus.   VSS.  AF.  FSBS<200.  UOP ~70cc/hr. Gen: NAD Abd: soft, appropriately TTP, Inc c/d/i, no rebound or guarding Ext: no c/c/e 51yo POD#1 s/p LAVH -Postop: Will continue to try to improve postop nausea control.  Will add Scopolamine patch.  Transition to po meds may also improve.  Will d/c Foley once movement-related nausea better controlled.  Will continue to advance diet when able.   -DM: No meds currently.  Holding until taking po.  Will treat fsbs>200 with SSI prn.   -ABL anemia: Hgb 9.8.  Will d/c home with FeSO4.    Mitchel Honour, DO

## 2012-09-24 NOTE — Progress Notes (Signed)
Much improved nausea since scopolamine patch placed.  Able to ambulate without nausea.  Tolerating po.  Voiding without difficulty.  No flatus.  No VB.  Pain well controlled with po Percocet.  Requesting discharge tomorrow since she just started getting nausea control.   VSS.  AF.  Adequate diuresis.  BG <200. Gen : A&Ox3, NAD Abd: soft, ND.  Inc c/d/i Ext: no c/c/e, SCDs. 51yo POD#1 s/p LAVH -Postop: Meeting all goals.  Will continue current care and D/C home in AM.   -DM: Continue to watch glucose values and tx if >200.  [name] Millet Eartha Vonbehren DO

## 2012-09-24 NOTE — Progress Notes (Signed)
Patient states she is ok taking percocet, encouraged pt to eat a light meal first.

## 2012-09-25 MED ORDER — IBUPROFEN 800 MG PO TABS: 800.0000 mg | ORAL_TABLET | Freq: Three times a day (TID) | ORAL | Status: DC

## 2012-09-25 MED ORDER — DSS 100 MG PO CAPS: 100.0000 mg | ORAL_CAPSULE | Freq: Two times a day (BID) | ORAL | Status: DC

## 2012-09-25 MED ORDER — OXYCODONE-ACETAMINOPHEN 5-325 MG PO TABS: 1.0000 | ORAL_TABLET | ORAL | Status: DC | PRN

## 2012-09-25 NOTE — Progress Notes (Signed)
Pt is discharged in the care of husband. Downstairs per ambulatory. Stable. Lapsites are clean and dry. Scanty amt of vaginal drainage noted on V-pad. Denies any pain or discomfort.Downstairs per ambulatory.

## 2012-09-25 NOTE — Discharge Summary (Signed)
Physician Discharge Summary  Patient ID: [name] [name] MRN: [id number] DOB/AGE: [birth year] 51 y.o.  Admit date: 09/23/2012 Discharge date: 09/25/2012  Admission Diagnoses:  Discharge Diagnoses:  Active Problems:  * No active hospital problems. *    Discharged Condition: good  Hospital Course: Patient admitted for LAVH which was performed without complication.  Please see op note for details.  Her postoperative course was complicated by nausea and vomiting which responded well to scopolamine patch.  On postop day #2, she was meeting all postop goals.  Consults: None  Significant Diagnostic Studies: n/a  Treatments: IV hydration, analgesia: Dilaudid and Percocet and surgery: LAVH  Discharge Exam: Blood pressure 111/75, pulse 82, temperature 98.2 F (36.8 C), temperature source Oral, resp. rate 18, height 5' 1.5" (1.562 m), weight 67.586 kg (149 lb), SpO2 98.00%. General appearance: alert, cooperative and appears stated age GI: Soft, nondistended, no rebound or guarding.  Incisions c/d/i x 2 Extremities: extremities normal, atraumatic, no cyanosis or edema  Disposition: 01-Home or Self Care  Discharge Orders    Future Appointments: Provider: Department: Dept Phone: Center:   11/20/2012 8:00 AM [facility] [phone] [facility]   11/27/2012 10:45 AM Amy Michelle Nasuti, MD [facility] [phone] [facility]       Medication List     As of 09/25/2012  9:34 AM    TAKE these medications         ALPRAZolam 0.25 MG tablet   Commonly known as: XANAX   Take 0.25 mg by mouth daily as needed. for panic attacks or sleep      colesevelam 625 MG tablet   Commonly known as: WELCHOL   Take 3,125 mg by mouth daily with breakfast. 5 tabs by mouth daily      DSS 100 MG Caps   Take 100 mg by mouth 2 (two) times daily.      glipiZIDE 10 MG 24 hr tablet   Commonly known as: GLUCOTROL XL   Take 1 tablet (10 mg total) by mouth daily.      glucose blood  test strip   Use as instructed      ibuprofen 800 MG tablet   Commonly known as: ADVIL,MOTRIN   Take 1 tablet (800 mg total) by mouth 3 (three) times daily.      levothyroxine 200 MCG tablet   Commonly known as: SYNTHROID, LEVOTHROID   Take 200 mcg by mouth daily. Take in addition to dose.      levothyroxine 25 MCG tablet   Commonly known as: SYNTHROID, LEVOTHROID   Take 25 mcg by mouth daily. Take  in addition to the 200 mcg tablet      metFORMIN 500 MG (MOD) 24 hr tablet   Commonly known as: GLUMETZA   Take 3 tablets (1,500 mg total) by mouth daily with breakfast.      ONE TOUCH LANCETS Misc   Use as directed Dx. 250.0      ONE TOUCH ULTRA SYSTEM KIT W/DEVICE Kit   1 kit by Does not apply route once.      oxyCODONE-acetaminophen 5-325 MG per tablet   Commonly known as: PERCOCET/ROXICET   Take 1-2 tablets by mouth every 4 (four) hours as needed.      promethazine 25 MG tablet   Commonly known as: PHENERGAN   Take 25 mg by mouth daily as needed. For nausea associated with migraine      rosuvastatin 40 MG tablet   Commonly known as: CRESTOR   Take  20 mg by mouth daily.      sertraline 50 MG tablet   Commonly known as: ZOLOFT   Take 50 mg by mouth at bedtime.      SUMAtriptan 100 MG tablet   Commonly known as: IMITREX   Take 1 tablet (100 mg total) by mouth every 2 (two) hours as needed for migraine.         SignedMitchel Honour 09/25/2012, 9:34 AM

## 2012-11-20 ENCOUNTER — Telehealth: Payer: Self-pay | Admitting: Family Medicine

## 2012-11-20 ENCOUNTER — Other Ambulatory Visit: Payer: No Typology Code available for payment source

## 2012-11-20 DIAGNOSIS — E538 Deficiency of other specified B group vitamins: Secondary | ICD-10-CM

## 2012-11-20 DIAGNOSIS — E119 Type 2 diabetes mellitus without complications: Secondary | ICD-10-CM

## 2012-11-20 DIAGNOSIS — E039 Hypothyroidism, unspecified: Secondary | ICD-10-CM

## 2012-11-20 DIAGNOSIS — E785 Hyperlipidemia, unspecified: Secondary | ICD-10-CM

## 2012-11-20 NOTE — Telephone Encounter (Signed)
Message copied by Excell Seltzer on Thu Nov 20, 2012  1:37 AM ------      Message from: Liane Comber C      Created: Wed Nov 19, 2012  8:26 AM      Regarding: 3 mo f/u labs tomorrow 12/12       Please order  future f/u labs for pt's upcomming lab appt. She has a lipid and an a1c ordered, but those are old orders and she recently had lipids. Do you want them both or anything else drawn?      Thanks      Rodney Booze

## 2012-11-27 ENCOUNTER — Ambulatory Visit: Payer: No Typology Code available for payment source | Admitting: Family Medicine

## 2012-11-27 DIAGNOSIS — Z0289 Encounter for other administrative examinations: Secondary | ICD-10-CM

## 2013-01-13 LAB — HM DIABETES EYE EXAM

## 2013-06-22 ENCOUNTER — Encounter: Payer: Self-pay | Admitting: Radiology

## 2013-06-23 ENCOUNTER — Telehealth: Payer: Self-pay | Admitting: Family Medicine

## 2013-06-23 ENCOUNTER — Ambulatory Visit (INDEPENDENT_AMBULATORY_CARE_PROVIDER_SITE_OTHER): Payer: 59 | Admitting: Family Medicine

## 2013-06-23 ENCOUNTER — Other Ambulatory Visit: Payer: Self-pay | Admitting: Family Medicine

## 2013-06-23 ENCOUNTER — Encounter: Payer: Self-pay | Admitting: Family Medicine

## 2013-06-23 VITALS — BP 120/72 | HR 96 | Temp 98.0°F | Ht 61.5 in | Wt 155.0 lb

## 2013-06-23 DIAGNOSIS — E785 Hyperlipidemia, unspecified: Secondary | ICD-10-CM

## 2013-06-23 DIAGNOSIS — I10 Essential (primary) hypertension: Secondary | ICD-10-CM

## 2013-06-23 DIAGNOSIS — E119 Type 2 diabetes mellitus without complications: Secondary | ICD-10-CM

## 2013-06-23 DIAGNOSIS — E039 Hypothyroidism, unspecified: Secondary | ICD-10-CM

## 2013-06-23 LAB — COMPREHENSIVE METABOLIC PANEL
Albumin: 4.1 g/dL (ref 3.5–5.2)
Alkaline Phosphatase: 62 U/L (ref 39–117)
BUN: 8 mg/dL (ref 6–23)
Creatinine, Ser: 0.8 mg/dL (ref 0.4–1.2)
Glucose, Bld: 176 mg/dL — ABNORMAL HIGH (ref 70–99)
Potassium: 4.2 mEq/L (ref 3.5–5.1)

## 2013-06-23 LAB — CBC WITH DIFFERENTIAL/PLATELET
Eosinophils Relative: 1.6 % (ref 0.0–5.0)
HCT: 44.1 % (ref 36.0–46.0)
Monocytes Relative: 5.5 % (ref 3.0–12.0)
Neutrophils Relative %: 67.5 % (ref 43.0–77.0)
Platelets: 345 10*3/uL (ref 150.0–400.0)
WBC: 13.2 10*3/uL — ABNORMAL HIGH (ref 4.5–10.5)

## 2013-06-23 LAB — LIPID PANEL
Cholesterol: 256 mg/dL — ABNORMAL HIGH (ref 0–200)
Total CHOL/HDL Ratio: 7
Triglycerides: 276 mg/dL — ABNORMAL HIGH (ref 0.0–149.0)
VLDL: 55.2 mg/dL — ABNORMAL HIGH (ref 0.0–40.0)

## 2013-06-23 LAB — HEMOGLOBIN A1C: Hgb A1c MFr Bld: 9.3 % — ABNORMAL HIGH (ref 4.6–6.5)

## 2013-06-23 MED ORDER — GLUCOSE BLOOD VI STRP: ORAL_STRIP | Status: DC

## 2013-06-23 MED ORDER — ONETOUCH ULTRA SYSTEM W/DEVICE KIT: 1.0000 | PACK | Freq: Once | Status: DC

## 2013-06-23 MED ORDER — INSULIN GLARGINE 100 UNIT/ML SOLOSTAR PEN: 10.0000 [IU] | PEN_INJECTOR | Freq: Every day | SUBCUTANEOUS | Status: DC

## 2013-06-23 MED ORDER — COLESEVELAM HCL 3.75 G PO PACK: 1.0000 | PACK | Freq: Every day | ORAL | Status: DC

## 2013-06-23 MED ORDER — ONETOUCH LANCETS MISC: Status: DC

## 2013-06-23 NOTE — Assessment & Plan Note (Signed)
Well controlled. Continue current medication.  

## 2013-06-23 NOTE — Assessment & Plan Note (Signed)
Likely poor control. Restart crestor.. Change to powder form of welchol for ease of administration.

## 2013-06-23 NOTE — Assessment & Plan Note (Addendum)
Likely poor control, check A1C. Will plan to start lantus 10 untis daily and titrate up.  Encouraged exercise, weight loss, healthy eating habits.  Follow up in 1 month.

## 2013-06-23 NOTE — Assessment & Plan Note (Signed)
Re-eval. Go ahead and restart at least the 200 mcg dose of med. Plan recheck in 4 weeks at follow up.

## 2013-06-23 NOTE — Telephone Encounter (Signed)
Message copied by Kerby Nora E on Tue Jun 23, 2013 12:10 PM ------      Message from: Baldomero Lamy      Created: Tue Jun 23, 2013 11:56 AM      Regarding: Orders for labs today       This pt came over for labs after seeing you. I didn't have any orders from today, so I drew everything that was ordered on 12/13. Is there anything else you wold like her to have done? She mentioned thyroid labs, but I didn't see any ordered.            Thanks      Tasha ------

## 2013-06-23 NOTE — Progress Notes (Signed)
  Subjective:    Patient ID: [name] [name], female    DOB: 31-Jul-1961, 52 y.o.   MRN: [id number]  HPI  52 year old female 6 months overdue for her DM , HTN and lipid follow up presents today off all her medications.  She reports she stopped her meds since 10/2012 and did not follow up because she was tired of meds and felt frustrated.   She even stopped her thyroid medicaiton.  She reports that she has recently had a hysterectomy in 09/2013 for fibroids. No cancer seen. She felt well after surgery but  Now more recently feeling fatigued.  She has not been taking blood sugar until few days ago.Marland Kitchen FBS 49 Last night  1 hour after dinner  blood sugar was 233. She wants to try a different medicaiton than metformin.. She felt like she was taking too many pills.  Anxiety poorly controlled, irritable, difficulty sleeping.. Sister in law moved in , car messed up. INcrease stress. Had stopped zoloft. Has grandbaby coming and she wants to "shape up" so she can be around for her.   Review of Systems  Constitutional: Positive for fatigue. Negative for fever.  HENT: Negative for ear pain.   Eyes: Negative for pain.  Respiratory: Negative for chest tightness and shortness of breath.   Cardiovascular: Negative for chest pain, palpitations and leg swelling.  Gastrointestinal: Negative for abdominal pain.  Genitourinary: Negative for dysuria.       Objective:   Physical Exam  Constitutional: Vital signs are normal. She appears well-developed and well-nourished. She is cooperative.  Non-toxic appearance. She does not appear ill. No distress.  HENT:  Head: Normocephalic.  Right Ear: Hearing, tympanic membrane, external ear and ear canal normal. Tympanic membrane is not erythematous, not retracted and not bulging.  Left Ear: Hearing, tympanic membrane, external ear and ear canal normal. Tympanic membrane is not erythematous, not retracted and not bulging.  Nose: No mucosal edema or rhinorrhea. Right  sinus exhibits no maxillary sinus tenderness and no frontal sinus tenderness. Left sinus exhibits no maxillary sinus tenderness and no frontal sinus tenderness.  Mouth/Throat: Uvula is midline, oropharynx is clear and moist and mucous membranes are normal.  Eyes: Conjunctivae, EOM and lids are normal. Pupils are equal, round, and reactive to light. No foreign bodies found.  Neck: Trachea normal and normal range of motion. Neck supple. Carotid bruit is not present. No mass and no thyromegaly present.  Cardiovascular: Normal rate, regular rhythm, S1 normal, S2 normal, normal heart sounds, intact distal pulses and normal pulses.  Exam reveals no gallop and no friction rub.   No murmur heard. Pulmonary/Chest: Effort normal and breath sounds normal. Not tachypneic. No respiratory distress. She has no decreased breath sounds. She has no wheezes. She has no rhonchi. She has no rales.  Abdominal: Soft. Normal appearance and bowel sounds are normal. There is no tenderness.  Neurological: She is alert.  Skin: Skin is warm, dry and intact. No rash noted.  Psychiatric: Her speech is normal and behavior is normal. Judgment and thought content normal. Her mood appears not anxious. Cognition and memory are normal. She does not exhibit a depressed mood.   Diabetic foot exam: Normal inspection No skin breakdown No calluses  Normal DP pulses Normal sensation to light touch and monofilament Nails normal         Assessment & Plan:

## 2013-06-23 NOTE — Addendum Note (Signed)
Addended by: Baldomero Lamy on: 06/23/2013 01:38 PM   Modules accepted: Orders

## 2013-06-23 NOTE — Assessment & Plan Note (Signed)
Restart zoloft daily.

## 2013-06-23 NOTE — Telephone Encounter (Signed)
Yes those are the labs I wanted, I didn't think I had to enter them again... Sorry I though TSH was in there.. Please add.

## 2013-06-23 NOTE — Patient Instructions (Addendum)
Stop at lab on your way out. Restart thyroid medication NOW.. Start 200 mcg tablet only. Once labs back...we will plan to start lantus 10 units daily. Check blood sugar every morning.. If blood sugar is above 120 for 2 days in a row.. Increase lantus by 2 units. Restart crestor and welchol powder. Start back on zoloft.

## 2013-06-24 ENCOUNTER — Telehealth: Payer: Self-pay

## 2013-06-24 ENCOUNTER — Other Ambulatory Visit: Payer: Self-pay | Admitting: Family Medicine

## 2013-06-24 MED ORDER — INSULIN PEN NEEDLE 31G X 8 MM MISC: Status: DC

## 2013-06-24 NOTE — Telephone Encounter (Signed)
[facility] pharmacy tech [facility] left v/m; received lantus solostar rx but needs prescription for pen needles.Please advise.

## 2013-06-24 NOTE — Telephone Encounter (Signed)
RX SENT TO PHARMACY

## 2013-07-03 ENCOUNTER — Encounter: Payer: Self-pay | Admitting: Family Medicine

## 2013-07-13 ENCOUNTER — Emergency Department (HOSPITAL_COMMUNITY): Payer: 59

## 2013-07-13 ENCOUNTER — Emergency Department (HOSPITAL_COMMUNITY)
Admission: EM | Admit: 2013-07-13 | Discharge: 2013-07-13 | Disposition: A | Discharge status: Home / Self Care | Payer: 59 | Attending: Emergency Medicine | Admitting: Emergency Medicine

## 2013-07-13 ENCOUNTER — Encounter (HOSPITAL_COMMUNITY): Payer: Self-pay | Admitting: Emergency Medicine

## 2013-07-13 DIAGNOSIS — R112 Nausea with vomiting, unspecified: Secondary | ICD-10-CM | POA: Insufficient documentation

## 2013-07-13 DIAGNOSIS — E1149 Type 2 diabetes mellitus with other diabetic neurological complication: Secondary | ICD-10-CM | POA: Insufficient documentation

## 2013-07-13 DIAGNOSIS — R05 Cough: Secondary | ICD-10-CM | POA: Insufficient documentation

## 2013-07-13 DIAGNOSIS — R1110 Vomiting, unspecified: Secondary | ICD-10-CM

## 2013-07-13 DIAGNOSIS — K589 Irritable bowel syndrome without diarrhea: Secondary | ICD-10-CM | POA: Insufficient documentation

## 2013-07-13 DIAGNOSIS — R5381 Other malaise: Secondary | ICD-10-CM | POA: Insufficient documentation

## 2013-07-13 DIAGNOSIS — Z79899 Other long term (current) drug therapy: Secondary | ICD-10-CM | POA: Insufficient documentation

## 2013-07-13 DIAGNOSIS — I10 Essential (primary) hypertension: Secondary | ICD-10-CM | POA: Insufficient documentation

## 2013-07-13 DIAGNOSIS — G609 Hereditary and idiopathic neuropathy, unspecified: Secondary | ICD-10-CM | POA: Insufficient documentation

## 2013-07-13 DIAGNOSIS — M810 Age-related osteoporosis without current pathological fracture: Secondary | ICD-10-CM | POA: Insufficient documentation

## 2013-07-13 DIAGNOSIS — E785 Hyperlipidemia, unspecified: Secondary | ICD-10-CM | POA: Insufficient documentation

## 2013-07-13 DIAGNOSIS — Z87891 Personal history of nicotine dependence: Secondary | ICD-10-CM | POA: Insufficient documentation

## 2013-07-13 DIAGNOSIS — R109 Unspecified abdominal pain: Secondary | ICD-10-CM | POA: Insufficient documentation

## 2013-07-13 DIAGNOSIS — F411 Generalized anxiety disorder: Secondary | ICD-10-CM | POA: Insufficient documentation

## 2013-07-13 DIAGNOSIS — R059 Cough, unspecified: Secondary | ICD-10-CM | POA: Insufficient documentation

## 2013-07-13 DIAGNOSIS — E039 Hypothyroidism, unspecified: Secondary | ICD-10-CM | POA: Insufficient documentation

## 2013-07-13 DIAGNOSIS — R197 Diarrhea, unspecified: Secondary | ICD-10-CM | POA: Insufficient documentation

## 2013-07-13 DIAGNOSIS — R42 Dizziness and giddiness: Secondary | ICD-10-CM | POA: Insufficient documentation

## 2013-07-13 DIAGNOSIS — Z794 Long term (current) use of insulin: Secondary | ICD-10-CM | POA: Insufficient documentation

## 2013-07-13 LAB — COMPREHENSIVE METABOLIC PANEL
BUN: 14 mg/dL (ref 6–23)
Calcium: 9.3 mg/dL (ref 8.4–10.5)
Creatinine, Ser: 0.62 mg/dL (ref 0.50–1.10)
GFR calc Af Amer: >90 mL/min (ref >90)
Glucose, Bld: 260 mg/dL — ABNORMAL HIGH (ref 70–99)
Total Protein: 7.2 g/dL (ref 6.0–8.3)

## 2013-07-13 LAB — CBC
Hemoglobin: 15.1 g/dL — ABNORMAL HIGH (ref 12.0–15.0)
MCHC: 34.7 g/dL (ref 30.0–36.0)
RBC: 5.54 MIL/uL — ABNORMAL HIGH (ref 3.87–5.11)

## 2013-07-13 LAB — LIPASE, BLOOD: Lipase: 43 U/L (ref 11–59)

## 2013-07-13 MED ORDER — IOHEXOL 300 MG/ML  SOLN
50.0000 mL | Freq: Once | INTRAMUSCULAR | Status: AC | PRN
  Administered 2013-07-13: 50 mL via ORAL

## 2013-07-13 MED ORDER — SODIUM CHLORIDE 0.9 % IV BOLUS (SEPSIS)
1000.0000 mL | Freq: Once | INTRAVENOUS | Status: AC
  Administered 2013-07-13: 1000 mL via INTRAVENOUS

## 2013-07-13 MED ORDER — FENTANYL CITRATE 0.05 MG/ML IJ SOLN
50.0000 ug | Freq: Once | INTRAMUSCULAR | Status: AC
  Administered 2013-07-13: 50 ug via INTRAVENOUS
  Filled 2013-07-13: qty 2

## 2013-07-13 MED ORDER — METOCLOPRAMIDE HCL 5 MG/ML IJ SOLN
10.0000 mg | Freq: Once | INTRAMUSCULAR | Status: AC
  Administered 2013-07-13: 10 mg via INTRAVENOUS
  Filled 2013-07-13: qty 2

## 2013-07-13 MED ORDER — IOHEXOL 300 MG/ML  SOLN
100.0000 mL | Freq: Once | INTRAMUSCULAR | Status: AC | PRN
  Administered 2013-07-13: 100 mL via INTRAVENOUS

## 2013-07-13 MED ORDER — ONDANSETRON HCL 4 MG/2ML IJ SOLN
4.0000 mg | Freq: Once | INTRAMUSCULAR | Status: AC
  Administered 2013-07-13: 4 mg via INTRAVENOUS
  Filled 2013-07-13: qty 2

## 2013-07-13 MED ORDER — ONDANSETRON 4 MG PO TBDP: ORAL_TABLET | ORAL | Status: DC

## 2013-07-13 NOTE — Discharge Instructions (Signed)
If you were given medicines take as directed.  If you are on coumadin or contraceptives realize their levels and effectiveness is altered by many different medicines.  If you have any reaction (rash, tongues swelling, other) to the medicines stop taking and see a physician.   °Please follow up as directed and return to the ER or see a physician for new or worsening symptoms.  Thank you. ° ° °

## 2013-07-13 NOTE — ED Provider Notes (Signed)
CSN: [id number]     Arrival date & time 07/13/13  0830 History     First MD Initiated Contact with Patient 07/13/13 [phone]     Chief Complaint  Patient presents with  . Emesis  . Cough   (Consider location/radiation/quality/duration/timing/severity/associated sxs/prior Treatment) HPI Comments: 52 yo female with DMII, smoking, htn, IBS presents with intermittent vomiting and diarrhea since early this am, similar to previous.  No focal pain, upper abdominal cramping.  No known gb issues.  Hysterectomy in the past.  No blood.  Nothing improves.    Patient is a 52 y.o. female presenting with vomiting and cough. The history is provided by the patient.  Emesis Severity:  Moderate Associated symptoms: diarrhea   Associated symptoms: no chills and no headaches   Cough Associated symptoms: no chest pain, no chills, no fever, no headaches, no rash and no shortness of breath     Past Medical History  Diagnosis Date  . Osteoporosis   . Intermittent vertigo   . B12 deficiency   . Peripheral neuropathy   . GERD (gastroesophageal reflux disease)   . Anxiety   . Diabetes mellitus   . Migraine   . Hyperlipidemia   . Hypothyroidism   . IBS (irritable bowel syndrome)    Past Surgical History  Procedure Laterality Date  . Cesarean section  1989  . Tubal ligation      bilateral   . Laparoscopic assisted vaginal hysterectomy  09/23/2012    Procedure: LAPAROSCOPIC ASSISTED VAGINAL HYSTERECTOMY;  Surgeon: Mitchel Honour, DO;  Location: [facility];  Service: Gynecology;  Laterality: N/A;  pull Dr Vance Gather instrument   Family History  Problem Relation Age of Onset  . Heart failure Mother   . Diabetes Maternal Grandmother   . Alzheimer's disease Maternal Grandmother   . Coronary artery disease Maternal Grandmother   . Heart attack Maternal Grandfather    History  Substance Use Topics  . Smoking status: Former Games developer  . Smokeless tobacco: Not on file     Comment: 1/2 pack per day  . Alcohol Use:  No   OB History   Grav Para Term Preterm Abortions TAB SAB Ect Mult Living                 Review of Systems  Constitutional: Positive for appetite change and fatigue. Negative for fever and chills.  HENT: Negative for neck pain and neck stiffness.   Eyes: Negative for visual disturbance.  Respiratory: Positive for cough (mild productive). Negative for shortness of breath.   Cardiovascular: Negative for chest pain.  Gastrointestinal: Positive for nausea, vomiting and diarrhea.  Genitourinary: Negative for dysuria and flank pain.  Musculoskeletal: Negative for back pain.  Skin: Negative for rash.  Neurological: Positive for light-headedness. Negative for headaches.    Allergies  Codeine  Home Medications   Current Outpatient Rx  Name  Route  Sig  Dispense  Refill  . Blood Glucose Monitoring Suppl (ONE TOUCH ULTRA SYSTEM KIT) W/DEVICE KIT   Does not apply   1 kit by Does not apply route once.   1 each   0   . glucose blood (ACCU-CHEK ACTIVE STRIPS) test strip      Use as instructed   100 each   12     Please fill patient specfic brand of test stripes   . Insulin Glargine (LANTUS SOLOSTAR) 100 UNIT/ML SOPN   Subcutaneous   Inject 10 Units into the skin every morning.         Marland Kitchen  Insulin Pen Needle (RA PEN NEEDLES) 31G X 8 MM MISC      Use as directed daily   100 each   3   . levothyroxine (SYNTHROID, LEVOTHROID) 200 MCG tablet   Oral   Take 200 mcg by mouth every morning. She takes with a tablet to equal her dose of .         Marland Kitchen levothyroxine (SYNTHROID, LEVOTHROID) 25 MCG tablet   Oral   Take 25 mcg by mouth every morning. She takes with a tablet to equal her dose of .         . ONE TOUCH LANCETS MISC      Use as directed Dx. 250.0   200 each   6   . rosuvastatin (CRESTOR) 40 MG tablet   Oral   Take 40 mg by mouth daily after supper.         . sertraline (ZOLOFT) 50 MG tablet   Oral   Take 50 mg by mouth at bedtime.          . SUMAtriptan (IMITREX) 100 MG tablet   Oral   Take 1 tablet (100 mg total) by mouth every 2 (two) hours as needed for migraine.   10 tablet   0    BP 150/72  Pulse 54  Temp(Src) 97.9 F (36.6 C) (Oral)  Resp 20  Ht 5\' 1"  (1.549 m)  Wt 156 lb (70.761 kg)  BMI 29.49 kg/m2  SpO2 98%  LMP 08/11/2012 Physical Exam  Nursing note and vitals reviewed. Constitutional: She is oriented to person, place, and time. She appears well-developed and well-nourished.  HENT:  Head: Normocephalic and atraumatic.  Dry mm  Eyes: Conjunctivae are normal. Right eye exhibits no discharge. Left eye exhibits no discharge. No scleral icterus.  Neck: Normal range of motion. Neck supple. No tracheal deviation present.  Cardiovascular: Normal rate and regular rhythm.   Pulmonary/Chest: Effort normal and breath sounds normal.  Abdominal: Soft. She exhibits no distension. There is no tenderness. There is no guarding.  Musculoskeletal: She exhibits no edema.  Neurological: She is alert and oriented to person, place, and time.  Skin: Skin is warm. No rash noted.  Psychiatric: She has a normal mood and affect.    ED Course   Procedures (including critical care time)  Labs Reviewed  COMPREHENSIVE METABOLIC PANEL  LIPASE, BLOOD  CBC   Dg Chest 2 View  07/13/2013   *RADIOLOGY REPORT*  Clinical Data: Cough and emesis  CHEST - 2 VIEW  Comparison: February 19, 2011  Findings: The lungs are clear.  The heart size and pulmonary vascularity are normal.  No adenopathy.  No bone lesions.  IMPRESSION:  No edema or consolidation.   Original Report Authenticated By: Bretta Bang, M.D.   No diagnosis found.  MDM  Concern for gastritis vs colitis. Improved on recheck however pain persists.  With pain, wbc elevation CT done. Pt did not tolerate po contrast. Zofran given. Dg Chest 2 View  07/13/2013   *RADIOLOGY REPORT*  Clinical Data: Cough and emesis  CHEST - 2 VIEW  Comparison: February 19, 2011  Findings:  The lungs are clear.  The heart size and pulmonary vascularity are normal.  No adenopathy.  No bone lesions.  IMPRESSION:  No edema or consolidation.   Original Report Authenticated By: Bretta Bang, M.D.   Ct Abdomen Pelvis W Contrast  07/13/2013   *RADIOLOGY REPORT*  Clinical Data: Upper abdominal pain with nausea and vomiting. History  of colitis.  CT ABDOMEN AND PELVIS WITH CONTRAST  Technique:  Multidetector CT imaging of the abdomen and pelvis was performed following the standard protocol during bolus administration of intravenous contrast.  Contrast: 50mL OMNIPAQUE IOHEXOL 300 MG/ML  SOLN, OMNIPAQUE IOHEXOL 300 MG/ML  SOLN  Comparison: Abdominal pelvic CT 02/11/2008.  Acute abdominal series 03/08/2009.  Findings: The lung bases are clear and there is no pleural or pericardial effusion.  A small hiatal hernia appears unchanged.  There is a 5 mm low density lesion in the lateral segment of the left hepatic lobe (image 18) which may be slightly larger than on the prior study.  The liver otherwise appears unremarkable.  The gallbladder, biliary system and pancreas appear normal.  The spleen, adrenal glands and kidneys appear normal.  The stomach, small bowel and appendix appear normal.  There is no specific evidence of recurrent colitis.  The distal colon is decompressed and demonstrates mild diverticulosis, but no definite surrounding inflammatory change.  There are no enlarged abdominal pelvic lymph nodes.  The patient has undergone interval hysterectomy.  Both ovaries are visualized and appear normal.  The bladder appears normal.  An umbilical hernia containing only fat is unchanged.  There are no acute or worrisome osseous findings.  IMPRESSION:  1.  No acute findings status post interval hysterectomy. 2.  There is distal colonic diverticulosis without evidence of surrounding inflammation or extraluminal fluid collection. 3.  Small low density hepatic lesion, likely benign. 4.  Stable umbilical  hernia containing only fat.   Original Report Authenticated By: Carey Bullocks, M.D.   Fup outpt discussed.  DC  Enid Skeens, MD 07/13/13 [phone]

## 2013-07-13 NOTE — ED Notes (Signed)
ZOX:[id number]<AV> Expected date:<BR> Expected time:<BR> Means of arrival:<BR> Comments:<BR> vomiting

## 2013-07-13 NOTE — ED Notes (Signed)
Pt c/o n/v/d, abd pain and cough since 0230 this am.

## 2013-07-24 ENCOUNTER — Ambulatory Visit: Payer: 59 | Admitting: Family Medicine

## 2013-08-01 ENCOUNTER — Other Ambulatory Visit: Payer: Self-pay | Admitting: Family Medicine

## 2013-08-11 ENCOUNTER — Ambulatory Visit (INDEPENDENT_AMBULATORY_CARE_PROVIDER_SITE_OTHER): Payer: 59 | Admitting: Family Medicine

## 2013-08-11 ENCOUNTER — Encounter: Payer: Self-pay | Admitting: Family Medicine

## 2013-08-11 VITALS — BP 102/70 | HR 80 | Temp 98.2°F | Ht 61.5 in | Wt 150.5 lb

## 2013-08-11 DIAGNOSIS — E039 Hypothyroidism, unspecified: Secondary | ICD-10-CM

## 2013-08-11 DIAGNOSIS — I10 Essential (primary) hypertension: Secondary | ICD-10-CM

## 2013-08-11 DIAGNOSIS — E119 Type 2 diabetes mellitus without complications: Secondary | ICD-10-CM

## 2013-08-11 DIAGNOSIS — E559 Vitamin D deficiency, unspecified: Secondary | ICD-10-CM

## 2013-08-11 DIAGNOSIS — E785 Hyperlipidemia, unspecified: Secondary | ICD-10-CM

## 2013-08-11 NOTE — Patient Instructions (Addendum)
Increase insulin 20 Units daily. Continue to titrate up 2 Units every 2 days until at goal < 120 fasting in the morning. Follow up in 2 months with fasting labs prior.

## 2013-08-11 NOTE — Assessment & Plan Note (Signed)
Improving control, continue to titrate up insulin. Encouraged exercise, weight loss, healthy eating habits.

## 2013-08-11 NOTE — Progress Notes (Signed)
  Subjective:    Patient ID: [name] [name], female    DOB: 1961/01/17, 52 y.o.   MRN: [id number]  HPI  52 year old female returns for 1 month follow up DM after insulin start.  She has titrated up to 16 units daily   Lab Results  Component Value Date   HGBA1C 9.3* 06/23/2013  Using medications without difficulties: no SE. Hypoglycemic episodes:None Hyperglycemic episodes:215 once when she was sick Feet problems:None Blood Sugars averaging:119-149-170 [name]   She has increase exercsie walking.   Wt Readings from Last 3 Encounters:  08/11/13 150 lb 8 oz (68.266 kg)  07/13/13 156 lb (70.761 kg)  06/23/13 155 lb (70.308 kg)     She was in ER for abdominal pain.. Likely IBS flare. Abd/pelvic CT nml,  CXR nml. Only found small hernia with fat.  Anxiety:  Improved back on zoloft.     Review of Systems  Constitutional: Negative for fever and fatigue.  HENT: Negative for ear pain.   Eyes: Negative for pain.  Respiratory: Negative for chest tightness and shortness of breath.   Cardiovascular: Negative for chest pain, palpitations and leg swelling.  Gastrointestinal: Positive for abdominal pain.  Genitourinary: Negative for dysuria.       Objective:   Physical Exam  Constitutional: Vital signs are normal. She appears well-developed and well-nourished. She is cooperative.  Non-toxic appearance. She does not appear ill. No distress.  HENT:  Head: Normocephalic.  Right Ear: Hearing, tympanic membrane, external ear and ear canal normal. Tympanic membrane is not erythematous, not retracted and not bulging.  Left Ear: Hearing, tympanic membrane, external ear and ear canal normal. Tympanic membrane is not erythematous, not retracted and not bulging.  Nose: No mucosal edema or rhinorrhea. Right sinus exhibits no maxillary sinus tenderness and no frontal sinus tenderness. Left sinus exhibits no maxillary sinus tenderness and no frontal sinus tenderness.  Mouth/Throat: Uvula is  midline, oropharynx is clear and moist and mucous membranes are normal.  Eyes: Conjunctivae, EOM and lids are normal. Pupils are equal, round, and reactive to light. Lids are everted and swept, no foreign bodies found.  Neck: Trachea normal and normal range of motion. Neck supple. Carotid bruit is not present. No mass and no thyromegaly present.  Cardiovascular: Normal rate, regular rhythm, S1 normal, S2 normal, normal heart sounds, intact distal pulses and normal pulses.  Exam reveals no gallop and no friction rub.   No murmur heard. Pulmonary/Chest: Effort normal and breath sounds normal. Not tachypneic. No respiratory distress. She has no decreased breath sounds. She has no wheezes. She has no rhonchi. She has no rales.  Abdominal: Soft. Normal appearance and bowel sounds are normal. There is generalized tenderness.  improving  Neurological: She is alert.  Skin: Skin is warm, dry and intact. No rash noted.  Psychiatric: Her speech is normal and behavior is normal. Judgment and thought content normal. Her mood appears not anxious. Cognition and memory are normal. She does not exhibit a depressed mood.    Diabetic foot exam: Normal inspection No skin breakdown No calluses  Normal DP pulses Normal sensation to light touch and monofilament Nails normal       Assessment & Plan:

## 2013-08-11 NOTE — Assessment & Plan Note (Signed)
Well controlled. Continue current medication.  

## 2013-08-25 ENCOUNTER — Ambulatory Visit (INDEPENDENT_AMBULATORY_CARE_PROVIDER_SITE_OTHER): Payer: 59 | Admitting: Family Medicine

## 2013-08-25 ENCOUNTER — Encounter: Payer: Self-pay | Admitting: Family Medicine

## 2013-08-25 ENCOUNTER — Encounter: Payer: Self-pay | Admitting: Gastroenterology

## 2013-08-25 VITALS — BP 90/62 | HR 79 | Temp 98.1°F | Ht 61.5 in | Wt 147.0 lb

## 2013-08-25 DIAGNOSIS — R109 Unspecified abdominal pain: Secondary | ICD-10-CM | POA: Insufficient documentation

## 2013-08-25 DIAGNOSIS — K921 Melena: Secondary | ICD-10-CM

## 2013-08-25 DIAGNOSIS — J309 Allergic rhinitis, unspecified: Secondary | ICD-10-CM

## 2013-08-25 DIAGNOSIS — D72829 Elevated white blood cell count, unspecified: Secondary | ICD-10-CM | POA: Insufficient documentation

## 2013-08-25 LAB — COMPREHENSIVE METABOLIC PANEL
Albumin: 3.5 g/dL (ref 3.5–5.2)
Alkaline Phosphatase: 57 U/L (ref 39–117)
BUN: 9 mg/dL (ref 6–23)
CO2: 27 mEq/L (ref 19–32)
Calcium: 8.9 mg/dL (ref 8.4–10.5)
GFR: 91.07 mL/min (ref >60.00)
Glucose, Bld: 160 mg/dL — ABNORMAL HIGH (ref 70–99)
Potassium: 3.8 mEq/L (ref 3.5–5.1)
Sodium: 135 mEq/L (ref 135–145)
Total Protein: 7 g/dL (ref 6.0–8.3)

## 2013-08-25 LAB — CBC WITH DIFFERENTIAL/PLATELET
Basophils Relative: 0.6 % (ref 0.0–3.0)
Eosinophils Relative: 1.8 % (ref 0.0–5.0)
HCT: 42.3 % (ref 36.0–46.0)
Hemoglobin: 14.1 g/dL (ref 12.0–15.0)
Lymphs Abs: 3.3 10*3/uL (ref 0.7–4.0)
MCV: 81.6 fl (ref 78.0–100.0)
Monocytes Absolute: 0.8 10*3/uL (ref 0.1–1.0)
Monocytes Relative: 7.4 % (ref 3.0–12.0)
Neutrophils Relative %: 59 % (ref 43.0–77.0)
Platelets: 339 10*3/uL (ref 150.0–400.0)

## 2013-08-25 MED ORDER — CETIRIZINE HCL 10 MG PO CAPS: 10.0000 mg | ORAL_CAPSULE | Freq: Every day | ORAL | Status: DC

## 2013-08-25 MED ORDER — OMEPRAZOLE 40 MG PO CPDR: 40.0000 mg | DELAYED_RELEASE_CAPSULE | Freq: Every day | ORAL | Status: DC

## 2013-08-25 NOTE — Assessment & Plan Note (Signed)
No sign of infection in sinuses... Likely allergies. Can use zyrtec.

## 2013-08-25 NOTE — Patient Instructions (Addendum)
Stop aspirin. Can use tylenol for pain.  Start  prilosec OTC 2 tabs of 20 mg once daily. Try zyrtec at bedtime.  Eat bland diet diet.  Stop at lab on way out.  Stop at front desk to set up GI referral.

## 2013-08-25 NOTE — Assessment & Plan Note (Signed)
In past felt this was due to hemorrhoids. Pt without painful hemorrhoids ext at the monment.  May need further GI eval.

## 2013-08-25 NOTE — Addendum Note (Signed)
Addended by: Damita Lack on: 08/25/2013 11:06 AM   Modules accepted: Orders

## 2013-08-25 NOTE — Addendum Note (Signed)
Addended by: Alvina Chou on: 08/25/2013 09:12 AM   Modules accepted: Orders

## 2013-08-25 NOTE — Assessment & Plan Note (Signed)
Unclear cause.. Pt has a history of low level baseline elevation of wbcs.  Unnclear if   Increase in ER represents  new infection. Will re-eval

## 2013-08-25 NOTE — Assessment & Plan Note (Signed)
Recent CT unrevealing... ? Wbc increase related. Pain diffuse but most  Over epigastrum.  Will start PPI... eval for Hpylori given possible PUD vs gastritis.  No umbilical hernia, incacerated or not palpated Possibly due to IBS but more intense pain then pt experienced in past.

## 2013-08-25 NOTE — Progress Notes (Signed)
Subjective:    Patient ID: [name] [name], female    DOB: March 25, 1961, 52 y.o.   MRN: [id number]  Sinusitis This is a new problem. The current episode started in the past 7 days. The problem has been gradually worsening since onset. There has been no fever. Associated symptoms include congestion, coughing, headaches, sinus pressure and sneezing. Pertinent negatives include no ear pain, shortness of breath or sore throat. (Post nasal drip) Treatments tried: aspirin for headache.    She has also had  increase in abdominal pain in last 1-2 weeks. Never went away completely from August ( see ER visit)  Pain is constant entire central abdomen. Not feeling like IBS from stress she has had in the past.  No change with eating.  Hurts to walk and sit both. No relief with BM, maybe pain a little worse with BM. She has been noting large amount of blood in stool, there has been more than in past, clots and brp in toilet. Thought it was hemmorhoids in past, but this is more in the past. No pain with BMs, somewhat firm. Feels firm above umbilicus.  No N/V/D/C.  No fever.  Has tried tyle nol PM to help her sleep, occ aspirin.   She has history of umbilical hernia. She was seen in ER in 07/2013 for similar issue.  CT abd pelvis was unrevealing... No diverticulitis. Saw small umbilical hernia.  wbc 15, lipase and ast/alt normal.        Review of Systems  HENT: Positive for congestion, sneezing and sinus pressure. Negative for ear pain and sore throat.   Respiratory: Positive for cough. Negative for shortness of breath.   Neurological: Positive for headaches.       Objective:   Physical Exam  Constitutional: Vital signs are normal. She appears well-developed and well-nourished. She is cooperative.  Non-toxic appearance. She does not appear ill. No distress.  HENT:  Head: Normocephalic.  Right Ear: Hearing, tympanic membrane, external ear and ear canal normal. Tympanic membrane is not  erythematous, not retracted and not bulging.  Left Ear: Hearing, tympanic membrane, external ear and ear canal normal. Tympanic membrane is not erythematous, not retracted and not bulging.  Nose: No mucosal edema or rhinorrhea. Right sinus exhibits no maxillary sinus tenderness and no frontal sinus tenderness. Left sinus exhibits no maxillary sinus tenderness and no frontal sinus tenderness.  Mouth/Throat: Uvula is midline, oropharynx is clear and moist and mucous membranes are normal.  Eyes: Conjunctivae, EOM and lids are normal. Pupils are equal, round, and reactive to light. Lids are everted and swept, no foreign bodies found.  Neck: Trachea normal and normal range of motion. Neck supple. Carotid bruit is not present. No mass and no thyromegaly present.  Cardiovascular: Normal rate, regular rhythm, S1 normal, S2 normal, normal heart sounds, intact distal pulses and normal pulses.  Exam reveals no gallop and no friction rub.   No murmur heard. Pulmonary/Chest: Effort normal and breath sounds normal. Not tachypneic. No respiratory distress. She has no decreased breath sounds. She has no wheezes. She has no rhonchi. She has no rales.  Abdominal: Soft. Normal appearance and bowel sounds are normal. There is no hepatosplenomegaly. There is generalized tenderness. There is guarding. There is no rigidity, no rebound and no CVA tenderness.  Diffuse abdominal pain but greatest over epigastrum  Neurological: She is alert.  Skin: Skin is warm, dry and intact. No rash noted.  Psychiatric: Her speech is normal and behavior is normal. Judgment and thought  content normal. Her mood appears not anxious. Cognition and memory are normal. She does not exhibit a depressed mood.          Assessment & Plan:

## 2013-08-26 ENCOUNTER — Other Ambulatory Visit: Payer: Self-pay | Admitting: Family Medicine

## 2013-08-26 NOTE — Addendum Note (Signed)
Addended by: Alvina Chou on: 08/26/2013 11:34 AM   Modules accepted: Orders

## 2013-08-28 ENCOUNTER — Encounter: Payer: Self-pay | Admitting: Gastroenterology

## 2013-08-28 ENCOUNTER — Ambulatory Visit (INDEPENDENT_AMBULATORY_CARE_PROVIDER_SITE_OTHER): Payer: 59 | Admitting: Gastroenterology

## 2013-08-28 VITALS — BP 104/68 | HR 100 | Ht 59.5 in | Wt 148.1 lb

## 2013-08-28 DIAGNOSIS — R109 Unspecified abdominal pain: Secondary | ICD-10-CM

## 2013-08-28 DIAGNOSIS — K922 Gastrointestinal hemorrhage, unspecified: Secondary | ICD-10-CM

## 2013-08-28 DIAGNOSIS — K589 Irritable bowel syndrome without diarrhea: Secondary | ICD-10-CM

## 2013-08-28 LAB — HELICOBACTER PYLORI  SPECIAL ANTIGEN: H. PYLORI Antigen: NEGATIVE

## 2013-08-28 MED ORDER — DICYCLOMINE HCL 20 MG PO TABS: ORAL_TABLET | ORAL | Status: DC

## 2013-08-28 MED ORDER — MOVIPREP 100 G PO SOLR: 1.0000 | Freq: Once | ORAL | Status: DC

## 2013-08-28 NOTE — Patient Instructions (Addendum)
We sent the prescription for the colonoscopy for [facility] [address]. You have been scheduled for a colonoscopy with propofol. Please follow written instructions given to you at your visit today.  Please pick up your prep kit at the pharmacy within the next 1-3 days. We also sent a prescription for Bentyl ( dicyclomine ) 20 mg.  Pharmacy: [facility] [address]  If you use inhalers (even only as needed), please bring them with you on the day of your procedure. Your physician has requested that you go to www.startemmi.com and enter the access code given to you at your visit today. This web site gives a general overview about your procedure. However, you should still follow specific instructions given to you by our office regarding your preparation for the procedure.

## 2013-08-28 NOTE — Progress Notes (Signed)
08/28/2013 [name] [name] [id number] [birth year]   History of Present Illness:  Patient is a 52 year old female who is a patient of Dr. Norval Gable.  She presents to our office today with complaints of rectal bleeding and abdominal pains.  She has a history of IBS, which she thinks is probably the cause of her abdominal pain.  Has chronic diffuse abdominal discomfort.  Last week, however, she had an episode of rectal bleeding with clots seen on the toilet paper and in the toilet bowl.  This only occurred on one occasion and resolved without intervention.  Her last colonoscopy was in 02/2008 at which time the exam was normal except for a few diverticulum; biopsies for microscopic colitis were negative as well.  She also had an EGD at the same time, which was normal.  Biopsies of the small bowel showed only mild chronic duodenitis without villous atrophy or other features of sprue.  Recent CT scan of the abdomen and pelvis with contrast last month showed diverticulosis, a stable umbilical hernia containing fat only, and small low density hepatic lesion that is likely benign.   Current Medications, Allergies, Past Medical History, Past Surgical History, Family History and Social History were reviewed in [employer] record.   Physical Exam: BP 104/68  Pulse 100  Ht 4' 11.5" (1.511 m)  Wt 148 lb 2 oz (67.189 kg)  BMI 29.43 kg/m2  LMP 08/11/2012 General: Well developed, white female in no acute distress Head: Normocephalic and atraumatic Eyes:  sclerae anicteric, conjunctiva pink  Ears: Normal auditory acuity Lungs: Clear throughout to auscultation Heart: Regular rate and rhythm Abdomen: Soft, non-distended.  BS present.  Mild diffuse discomfort on palpation of the abdomen, but exam benign. Rectal: Deferred.  Will be done at the time of colonoscopy. Musculoskeletal: Symmetrical with no gross deformities  Extremities: No edema  Neurological: Alert oriented x 4, grossly  nonfocal Psychological:  Alert and cooperative. Normal mood and affect  Assessment and Recommendations: -Rectal bleeding:  Intermittent bright red blood but one episode of blood with clots on toilet paper and in the toilet.  Likely hemorrhoidal, but last colonoscopy was 5.5 years ago.  Will schedule repeat colonoscopy. -Abdominal pains:  Diffuse.  Has history of IBS, which is likely the etiology of her pain.  Will try bentyl 20 mg twice daily.

## 2013-08-31 ENCOUNTER — Encounter: Payer: Self-pay | Admitting: Gastroenterology

## 2013-09-06 ENCOUNTER — Other Ambulatory Visit: Payer: Self-pay | Admitting: Family Medicine

## 2013-09-07 ENCOUNTER — Telehealth: Payer: Self-pay | Admitting: Gastroenterology

## 2013-09-07 ENCOUNTER — Encounter: Payer: Self-pay | Admitting: Gastroenterology

## 2013-09-08 ENCOUNTER — Encounter: Payer: 59 | Admitting: Gastroenterology

## 2013-09-08 NOTE — Telephone Encounter (Signed)
FYI

## 2013-09-08 NOTE — Telephone Encounter (Signed)
no

## 2013-09-18 ENCOUNTER — Inpatient Hospital Stay (HOSPITAL_COMMUNITY)
Admission: EM | Admit: 2013-09-18 | Discharge: 2013-09-20 | DRG: 391 | Disposition: A | Discharge status: Home / Self Care | Payer: 59 | Attending: Internal Medicine | Admitting: Internal Medicine

## 2013-09-18 ENCOUNTER — Encounter (HOSPITAL_COMMUNITY): Payer: Self-pay | Admitting: Emergency Medicine

## 2013-09-18 ENCOUNTER — Other Ambulatory Visit: Payer: Self-pay

## 2013-09-18 ENCOUNTER — Emergency Department (HOSPITAL_COMMUNITY): Payer: 59

## 2013-09-18 DIAGNOSIS — E538 Deficiency of other specified B group vitamins: Secondary | ICD-10-CM

## 2013-09-18 DIAGNOSIS — J309 Allergic rhinitis, unspecified: Secondary | ICD-10-CM

## 2013-09-18 DIAGNOSIS — D72829 Elevated white blood cell count, unspecified: Secondary | ICD-10-CM

## 2013-09-18 DIAGNOSIS — IMO0001 Reserved for inherently not codable concepts without codable children: Secondary | ICD-10-CM | POA: Diagnosis present

## 2013-09-18 DIAGNOSIS — R109 Unspecified abdominal pain: Secondary | ICD-10-CM

## 2013-09-18 DIAGNOSIS — K5790 Diverticulosis of intestine, part unspecified, without perforation or abscess without bleeding: Secondary | ICD-10-CM | POA: Diagnosis present

## 2013-09-18 DIAGNOSIS — G629 Polyneuropathy, unspecified: Secondary | ICD-10-CM | POA: Diagnosis present

## 2013-09-18 DIAGNOSIS — K921 Melena: Secondary | ICD-10-CM

## 2013-09-18 DIAGNOSIS — K523 Indeterminate colitis: Secondary | ICD-10-CM | POA: Diagnosis present

## 2013-09-18 DIAGNOSIS — R252 Cramp and spasm: Secondary | ICD-10-CM

## 2013-09-18 DIAGNOSIS — R42 Dizziness and giddiness: Secondary | ICD-10-CM

## 2013-09-18 DIAGNOSIS — G5780 Other specified mononeuropathies of unspecified lower limb: Secondary | ICD-10-CM | POA: Diagnosis present

## 2013-09-18 DIAGNOSIS — R1115 Cyclical vomiting syndrome unrelated to migraine: Secondary | ICD-10-CM | POA: Diagnosis present

## 2013-09-18 DIAGNOSIS — F17200 Nicotine dependence, unspecified, uncomplicated: Secondary | ICD-10-CM

## 2013-09-18 DIAGNOSIS — K769 Liver disease, unspecified: Secondary | ICD-10-CM | POA: Diagnosis present

## 2013-09-18 DIAGNOSIS — E785 Hyperlipidemia, unspecified: Secondary | ICD-10-CM | POA: Diagnosis present

## 2013-09-18 DIAGNOSIS — I10 Essential (primary) hypertension: Secondary | ICD-10-CM | POA: Diagnosis present

## 2013-09-18 DIAGNOSIS — G609 Hereditary and idiopathic neuropathy, unspecified: Secondary | ICD-10-CM

## 2013-09-18 DIAGNOSIS — K219 Gastro-esophageal reflux disease without esophagitis: Secondary | ICD-10-CM | POA: Diagnosis present

## 2013-09-18 DIAGNOSIS — N920 Excessive and frequent menstruation with regular cycle: Secondary | ICD-10-CM

## 2013-09-18 DIAGNOSIS — K5289 Other specified noninfective gastroenteritis and colitis: Principal | ICD-10-CM | POA: Diagnosis present

## 2013-09-18 DIAGNOSIS — M810 Age-related osteoporosis without current pathological fracture: Secondary | ICD-10-CM | POA: Diagnosis present

## 2013-09-18 DIAGNOSIS — K589 Irritable bowel syndrome without diarrhea: Secondary | ICD-10-CM | POA: Diagnosis present

## 2013-09-18 DIAGNOSIS — H409 Unspecified glaucoma: Secondary | ICD-10-CM

## 2013-09-18 DIAGNOSIS — R092 Respiratory arrest: Secondary | ICD-10-CM | POA: Diagnosis not present

## 2013-09-18 DIAGNOSIS — E559 Vitamin D deficiency, unspecified: Secondary | ICD-10-CM

## 2013-09-18 DIAGNOSIS — G43009 Migraine without aura, not intractable, without status migrainosus: Secondary | ICD-10-CM

## 2013-09-18 DIAGNOSIS — K529 Noninfective gastroenteritis and colitis, unspecified: Secondary | ICD-10-CM

## 2013-09-18 DIAGNOSIS — E039 Hypothyroidism, unspecified: Secondary | ICD-10-CM | POA: Diagnosis present

## 2013-09-18 DIAGNOSIS — Z87891 Personal history of nicotine dependence: Secondary | ICD-10-CM

## 2013-09-18 DIAGNOSIS — F411 Generalized anxiety disorder: Secondary | ICD-10-CM | POA: Diagnosis present

## 2013-09-18 HISTORY — DX: Cramp and spasm: R25.2

## 2013-09-18 HISTORY — DX: Umbilical hernia without obstruction or gangrene: K42.9

## 2013-09-18 HISTORY — DX: Essential (primary) hypertension: I10

## 2013-09-18 LAB — CBC WITH DIFFERENTIAL/PLATELET
Basophils Absolute: 0 10*3/uL (ref 0.0–0.1)
Eosinophils Absolute: 0 10*3/uL (ref 0.0–0.7)
Eosinophils Absolute: 0 10*3/uL (ref 0.0–0.7)
Eosinophils Relative: 0 % (ref 0–5)
Eosinophils Relative: 0 % (ref 0–5)
HCT: 41.4 % (ref 36.0–46.0)
Hemoglobin: 15.5 g/dL — ABNORMAL HIGH (ref 12.0–15.0)
Lymphocytes Relative: 9 % — ABNORMAL LOW (ref 12–46)
Lymphs Abs: 1.8 10*3/uL (ref 0.7–4.0)
Lymphs Abs: 1.5 10*3/uL (ref 0.7–4.0)
MCH: 28 pg (ref 26.0–34.0)
MCHC: 35.7 g/dL (ref 30.0–36.0)
MCV: 78.7 fL (ref 78.0–100.0)
MCV: 78.4 fL (ref 78.0–100.0)
Monocytes Absolute: 0.6 10*3/uL (ref 0.1–1.0)
Monocytes Absolute: 0.7 10*3/uL (ref 0.1–1.0)
Monocytes Relative: 3 % (ref 3–12)
Neutrophils Relative %: 89 % — ABNORMAL HIGH (ref 43–77)
Platelets: 405 10*3/uL — ABNORMAL HIGH (ref 150–400)
Platelets: 368 10*3/uL (ref 150–400)
RBC: 5.72 MIL/uL — ABNORMAL HIGH (ref 3.87–5.11)
RBC: 5.28 MIL/uL — ABNORMAL HIGH (ref 3.87–5.11)
RDW: 15.2 % (ref 11.5–15.5)
WBC: 19.6 10*3/uL — ABNORMAL HIGH (ref 4.0–10.5)

## 2013-09-18 LAB — GLUCOSE, CAPILLARY: Glucose-Capillary: 229 mg/dL — ABNORMAL HIGH (ref 70–99)

## 2013-09-18 LAB — BASIC METABOLIC PANEL
BUN: 9 mg/dL (ref 6–23)
CO2: 19 mEq/L (ref 19–32)
Calcium: 9 mg/dL (ref 8.4–10.5)
Creatinine, Ser: 0.56 mg/dL (ref 0.50–1.10)
GFR calc non Af Amer: >90 mL/min (ref >90)
Glucose, Bld: 236 mg/dL — ABNORMAL HIGH (ref 70–99)
Sodium: 135 mEq/L (ref 135–145)

## 2013-09-18 LAB — POCT I-STAT 3, ART BLOOD GAS (G3+)
Bicarbonate: 18.1 mEq/L — ABNORMAL LOW (ref 20.0–24.0)
TCO2: 19 mmol/L (ref 0–100)
pCO2 arterial: 17.3 mmHg — CL (ref 35.0–45.0)
pH, Arterial: 7.627 (ref 7.350–7.450)
pO2, Arterial: 134 mmHg — ABNORMAL HIGH (ref 80.0–100.0)

## 2013-09-18 LAB — COMPREHENSIVE METABOLIC PANEL
ALT: 12 U/L (ref 0–35)
AST: 14 U/L (ref 0–37)
Albumin: 4.1 g/dL (ref 3.5–5.2)
CO2: 19 mEq/L (ref 19–32)
Calcium: 10 mg/dL (ref 8.4–10.5)
Creatinine, Ser: 0.65 mg/dL (ref 0.50–1.10)
Sodium: 137 mEq/L (ref 135–145)
Total Protein: 8 g/dL (ref 6.0–8.3)

## 2013-09-18 LAB — AMMONIA: Ammonia: 22 umol/L (ref 11–60)

## 2013-09-18 LAB — MRSA PCR SCREENING: MRSA by PCR: NEGATIVE

## 2013-09-18 LAB — CK TOTAL AND CKMB (NOT AT ARMC): Relative Index: INVALID (ref 0.0–2.5)

## 2013-09-18 MED ORDER — FENTANYL CITRATE 0.05 MG/ML IJ SOLN
50.0000 ug | Freq: Once | INTRAMUSCULAR | Status: AC
  Administered 2013-09-18: 50 ug via INTRAVENOUS
  Filled 2013-09-18: qty 2

## 2013-09-18 MED ORDER — ONDANSETRON HCL 4 MG/2ML IJ SOLN: 4.0000 mg | Freq: Once | INTRAMUSCULAR | Status: AC

## 2013-09-18 MED ORDER — METRONIDAZOLE IN NACL 5-0.79 MG/ML-% IV SOLN
500.0000 mg | Freq: Once | INTRAVENOUS | Status: AC
  Administered 2013-09-18: 500 mg via INTRAVENOUS
  Filled 2013-09-18: qty 100

## 2013-09-18 MED ORDER — SERTRALINE HCL 50 MG PO TABS
50.0000 mg | ORAL_TABLET | Freq: Every day | ORAL | Status: DC
  Administered 2013-09-19 – 2013-09-20 (×2): 50 mg via ORAL
  Filled 2013-09-18 (×2): qty 1

## 2013-09-18 MED ORDER — NALOXONE HCL 0.4 MG/ML IJ SOLN: 0.4000 mg | INTRAMUSCULAR | Status: DC

## 2013-09-18 MED ORDER — ONDANSETRON HCL 4 MG PO TABS: 4.0000 mg | ORAL_TABLET | Freq: Four times a day (QID) | ORAL | Status: DC | PRN

## 2013-09-18 MED ORDER — MORPHINE SULFATE 2 MG/ML IJ SOLN
2.0000 mg | INTRAMUSCULAR | Status: DC | PRN
  Administered 2013-09-18: 1 mg via INTRAVENOUS

## 2013-09-18 MED ORDER — LEVOTHYROXINE SODIUM 25 MCG PO TABS: 25.0000 ug | ORAL_TABLET | Freq: Every day | ORAL | Status: DC

## 2013-09-18 MED ORDER — METRONIDAZOLE IN NACL 5-0.79 MG/ML-% IV SOLN
500.0000 mg | Freq: Three times a day (TID) | INTRAVENOUS | Status: DC
  Administered 2013-09-18 – 2013-09-20 (×5): 500 mg via INTRAVENOUS
  Filled 2013-09-18 (×10): qty 100

## 2013-09-18 MED ORDER — CIPROFLOXACIN IN D5W 400 MG/200ML IV SOLN
400.0000 mg | Freq: Two times a day (BID) | INTRAVENOUS | Status: DC
  Administered 2013-09-18 – 2013-09-20 (×4): 400 mg via INTRAVENOUS
  Filled 2013-09-18 (×6): qty 200

## 2013-09-18 MED ORDER — MORPHINE SULFATE 2 MG/ML IJ SOLN
1.0000 mg | INTRAMUSCULAR | Status: DC | PRN
  Filled 2013-09-18: qty 1

## 2013-09-18 MED ORDER — PROMETHAZINE HCL 25 MG/ML IJ SOLN
25.0000 mg | Freq: Four times a day (QID) | INTRAMUSCULAR | Status: DC | PRN
  Administered 2013-09-18 – 2013-09-19 (×2): 25 mg via INTRAVENOUS
  Filled 2013-09-18 (×2): qty 1

## 2013-09-18 MED ORDER — ONDANSETRON HCL 4 MG/2ML IJ SOLN
4.0000 mg | Freq: Once | INTRAMUSCULAR | Status: AC
  Administered 2013-09-18: 4 mg via INTRAVENOUS
  Filled 2013-09-18: qty 2

## 2013-09-18 MED ORDER — LEVOTHYROXINE SODIUM 200 MCG PO TABS: 200.0000 ug | ORAL_TABLET | Freq: Every day | ORAL | Status: DC

## 2013-09-18 MED ORDER — LEVOTHYROXINE SODIUM 25 MCG PO TABS
225.0000 ug | ORAL_TABLET | Freq: Every day | ORAL | Status: DC
  Administered 2013-09-19 – 2013-09-20 (×2): 225 ug via ORAL
  Filled 2013-09-18 (×4): qty 1

## 2013-09-18 MED ORDER — SODIUM CHLORIDE 0.9 % IV BOLUS (SEPSIS)
1000.0000 mL | Freq: Once | INTRAVENOUS | Status: AC
  Administered 2013-09-18: 1000 mL via INTRAVENOUS

## 2013-09-18 MED ORDER — IOHEXOL 300 MG/ML  SOLN
100.0000 mL | Freq: Once | INTRAMUSCULAR | Status: AC | PRN
  Administered 2013-09-18: 100 mL via INTRAVENOUS

## 2013-09-18 MED ORDER — NALOXONE HCL 0.4 MG/ML IJ SOLN
INTRAMUSCULAR | Status: AC
  Filled 2013-09-18: qty 1

## 2013-09-18 MED ORDER — SODIUM CHLORIDE 0.9 % IV SOLN
1000.0000 mL | Freq: Once | INTRAVENOUS | Status: AC
  Administered 2013-09-18: 1000 mL via INTRAVENOUS

## 2013-09-18 MED ORDER — CIPROFLOXACIN IN D5W 400 MG/200ML IV SOLN
400.0000 mg | Freq: Once | INTRAVENOUS | Status: AC
  Administered 2013-09-18: 400 mg via INTRAVENOUS
  Filled 2013-09-18: qty 200

## 2013-09-18 MED ORDER — SODIUM CHLORIDE 0.9 % IV SOLN: 1000.0000 mL | Freq: Once | INTRAVENOUS | Status: DC

## 2013-09-18 MED ORDER — INSULIN ASPART 100 UNIT/ML ~~LOC~~ SOLN
0.0000 [IU] | SUBCUTANEOUS | Status: DC
  Administered 2013-09-18: 3 [IU] via SUBCUTANEOUS
  Administered 2013-09-19: 21:00:00 1 [IU] via SUBCUTANEOUS
  Administered 2013-09-19 (×2): 2 [IU] via SUBCUTANEOUS
  Administered 2013-09-20: 1 [IU] via SUBCUTANEOUS

## 2013-09-18 MED ORDER — ONDANSETRON HCL 4 MG/2ML IJ SOLN
4.0000 mg | Freq: Four times a day (QID) | INTRAMUSCULAR | Status: DC | PRN
  Filled 2013-09-18: qty 2

## 2013-09-18 MED ORDER — ONDANSETRON HCL 4 MG/2ML IJ SOLN
4.0000 mg | INTRAMUSCULAR | Status: DC | PRN
  Administered 2013-09-18 – 2013-09-19 (×2): 4 mg via INTRAVENOUS
  Filled 2013-09-18: qty 2

## 2013-09-18 MED ORDER — ONDANSETRON HCL 4 MG/2ML IJ SOLN
4.0000 mg | Freq: Three times a day (TID) | INTRAMUSCULAR | Status: DC | PRN
Start: 2013-09-18 — End: 2013-09-18
  Administered 2013-09-18: 4 mg via INTRAVENOUS
  Filled 2013-09-18: qty 2

## 2013-09-18 NOTE — ED Notes (Signed)
MD at bedside. 

## 2013-09-18 NOTE — ED Notes (Signed)
Pt from home, c/o N/V/D starting at 2 am with abd pain.

## 2013-09-18 NOTE — Progress Notes (Signed)
eLink Physician-Brief Progress Note Patient Name: [name] [name] DOB: 1961-04-24 MRN: [id number]  Date of Service  09/18/2013   HPI/Events of Note   The pt coded earlier.  Now in icu. Awake and alert  eICU Interventions  IVF increased,  Pain and nausea meds adjusted Full pccm note to follow   Intervention Category Major Interventions: Code management / supervision  Shan Levans 09/18/2013, 7:15 PM

## 2013-09-18 NOTE — H&P (Signed)
PULMONARY  / CRITICAL CARE MEDICINE  Name: [name] [name] MRN: [id number] DOB: 1961-10-18    ADMISSION DATE:  09/18/2013 CONSULTATION DATE:  09/18/2013  REFERRING MD :  Dr. Rito Ehrlich [facility]) PRIMARY SERVICE:  PCCM  CHIEF COMPLAINT:  Post Code Blue management after respiratory arrest  BRIEF PATIENT DESCRIPTION: 52 yo female with past medical history of poorly controlled DM, HTN, IBS brought to ED with crampy abdominal pain.  Admitted to hospitalist service with diagnosis of nonspecific colitis shown on CT with WBC 19.8. Pt became unresponsive around 1820 on 10/10 and code blue was called for respiratory arrest but never lost pulse. Pt became responsive after 3 chest compressions and was transferred to the ICU for PCCM management.  SIGNIFICANT EVENTS / STUDIES:  10/10 - admitted with crampy abd pain, WBC 19.8, colitis on abd/pelvic CT (diffuse wall thickening, adipose deposition in proximal colonic wall, scattered diverticulae, mild extrahepatic biliary dilation, benign stable-appearing left hepatic lobe lesion) 10/10 @1820  - arrest without loss of pulse, given Narcan, transferred to ICU  LINES / TUBES: PIV  CULTURES: none  ANTIBIOTICS: Cipro 10/10 >>> Flagyl 10/10 >>>  HISTORY OF PRESENT ILLNESS:  52 yo female with past medical history of poorly controlled DM, HTN, IBS brought to ED with crampy abdominal pain.  Admitted to hospitalist service with diagnosis of nonspecific colitis shown on CT with WBC 19.8. Pt became unresponsive around 1820 on 10/10 and code blue was called for respiratory arrest but never lost pulse. Pt became responsive after 3 chest compressions and was transferred to the ICU for PCCM management. Currently still complaining of abd pain, but no breathing difficulty, no chest pain. Still occasionally vomiting and complaining of cough. Reports pain has been going on "for a few days," and had diarrhea yesterday without BM today.  PAST MEDICAL HISTORY :  Past Medical History   Diagnosis Date  . Osteoporosis   . Intermittent vertigo   . B12 deficiency   . Peripheral neuropathy   . GERD (gastroesophageal reflux disease)   . Anxiety   . Hyperlipidemia   . Hypothyroidism   . IBS (irritable bowel syndrome)   . Hypertension   . Leg cramping     "@ night" (09/18/2013)  . Type II diabetes mellitus   . Umbilical hernia     unrepaired (09/18/2013)  . Migraine     "once q couple months" (09/18/2013)   Past Surgical History  Procedure Laterality Date  . Cesarean section  1989  . Laparoscopic assisted vaginal hysterectomy  09/23/2012    Procedure: LAPAROSCOPIC ASSISTED VAGINAL HYSTERECTOMY;  Surgeon: Mitchel Honour, DO;  Location: [facility];  Service: Gynecology;  Laterality: N/A;  pull Dr Vance Gather instrument  . Tubal ligation Bilateral 1992  . Vaginal hysterectomy  2013   Prior to Admission medications   Medication Sig Start Date End Date Taking? Authorizing Provider  Cetirizine HCl (ZYRTEC ALLERGY) 10 MG CAPS Take 1 capsule by mouth daily as needed (allergies).   Yes Historical Provider, MD  dicyclomine (BENTYL) 20 MG tablet Take 20 mg by mouth 2 (two) times daily.   Yes Historical Provider, MD  Insulin Glargine (LANTUS SOLOSTAR) 100 UNIT/ML SOPN Inject 20 Units into the skin every morning.    Yes Historical Provider, MD  levothyroxine (SYNTHROID, LEVOTHROID) 200 MCG tablet Take 200 mcg by mouth daily before breakfast.   Yes Historical Provider, MD  levothyroxine (SYNTHROID, LEVOTHROID) 25 MCG tablet Take 25 mcg by mouth daily before breakfast.   Yes Historical Provider, MD  omeprazole (  PRILOSEC) 40 MG capsule Take 40 mg by mouth daily.   Yes Historical Provider, MD  rosuvastatin (CRESTOR) 40 MG tablet Take 20 mg by mouth daily after supper.    Yes Historical Provider, MD  sertraline (ZOLOFT) 50 MG tablet Take 50 mg by mouth daily.   Yes Historical Provider, MD  Insulin Pen Needle (RA PEN NEEDLES) 31G X 8 MM MISC Use as directed daily 06/24/13   Amy Michelle Nasuti, MD    Allergies  Allergen Reactions  . Codeine Hives, Anxiety and Other (See Comments)    REACTION: N \\T \ V and hyper. Pt can take Percocet.    FAMILY HISTORY:  Family History  Problem Relation Age of Onset  . Heart failure Mother   . Diabetes Maternal Grandmother   . Alzheimer's disease Maternal Grandmother   . Coronary artery disease Maternal Grandmother   . Heart attack Maternal Grandfather    SOCIAL HISTORY:  reports that she quit smoking about 22 years ago. Her smoking use included Cigarettes. She smoked 0.00 packs per day for .2 years. She has never used smokeless tobacco. She reports that she does not drink alcohol or use illicit drugs.  REVIEW OF SYSTEMS:  Per HPI  INTERVAL HISTORY: Per HPI  VITAL SIGNS: Temp:  [97.1 F (36.2 C)-98.3 F (36.8 C)] 97.1 F (36.2 C) (10/10 2021) Pulse Rate:  [81-107] 98 (10/10 2100) Resp:  [10-33] 19 (10/10 2100) BP: (98-158)/(60-114) 98/60 mmHg (10/10 2100) SpO2:  [98 %-100 %] 100 % (10/10 2100) Weight:  [147 lb (66.679 kg)] 147 lb (66.679 kg) (10/10 2021) HEMODYNAMICS:   VENTILATOR SETTINGS:   INTAKE / OUTPUT: Intake/Output     10/10 0701 - 10/11 0700   I.V. (mL/kg) 100 (1.5)   IV Piggyback 200   Total Intake(mL/kg) 300 (4.5)   Urine (mL/kg/hr) 275   Total Output 275   Net +25         PHYSICAL EXAMINATION: General:  Appears acutely ill, but in NAD Neuro: awake/alert, nonfocal HEENT:  PERRL, mucous membranes moist Cardiovascular:  RRR, no m/r/g Lungs:  Bilateral diminished air entry but clear, no w/r/r Abdomen:  Soft, diffusely tender, bowel sounds diminished Musculoskeletal:  Moves all extremities, no edema Skin:  Intact  LABS:  Recent Labs Lab 09/18/13 1100 09/18/13 1927 09/18/13 2000 09/18/13 2005  HGB 15.5*  --   --  14.8  WBC 19.6*  --   --  20.0*  PLT 405*  --   --  368  NA 137  --   --  135  K 4.4  --   --  4.1  CL 99  --   --  99  CO2 19  --   --  19  GLUCOSE 219*  --   --  236*  BUN 13  --   --  9   CREATININE 0.65  --   --  0.56  CALCIUM 10.0  --   --  9.0  AST 14  --   --   --   ALT 12  --   --   --   ALKPHOS 87  --   --   --   BILITOT 0.4  --   --   --   PROT 8.0  --   --   --   ALBUMIN 4.1  --   --   --   TROPONINI  --   --  <0.30  --   PHART  --  7.627*  --   --  PCO2ART  --  17.3*  --   --   PO2ART  --  134.0*  --   --     Recent Labs Lab 09/18/13 1745 09/18/13 2033  GLUCAP 163* 229*    ASSESSMENT / PLAN:  PULMONARY A:  Respiratory arrest - resolved, uncertain cause (?vasovagal reaction related to abd pain, unlikely medication oversedation as was given only Fentanyl 50 mcg about 8 hours prior to arrest) Alkalosis on ABG immediately after arrest - ? hyperventilation alkalosis from pain possibly contributed to arrest Dry cough - ?viral syndrome P:   Goal SpO2>92, pH>7.30 Continuous pulse ox, supplemental O2 as needed Repeat blood gas reviewed, improved pCXR in the AM  CARDIOVASCULAR A: Intermittent mild tachycardia related to nausea EKG with sinus arrythmia - ? If occult arrhythmia contributed to arrest HTN, HLD P:  Goal MAP>60 Trend troponin, repeat EKG in the AM ASA Lipitor for home Crestor Consider TTE  RENAL A:  No acute issue P:   Goal CVP>10 Trend BMP NS@100  mL/h  GASTROINTESTINAL A:  Acute colitis with nausea/vomiting and abd pain - pain improved but persistent vomiting Extrahepatic biliary dilation on CT Left liver lobe lesion (benign/stable appearance?) on CT Hx GERD Hx IBS P:   NPO, morphine for pain  Need to consider OG or NT, but pt has been refusing Zofran, Phenergan PRN Protonix for GI Px (also on omeprazole at home) Consider restart home Bentyl when taking PO Check LFTs in the AM  HEMATOLOGIC A:  Leukocytosis in setting of likely acute infection Hb and platelets stable P:  Trend CBC - see cultures / abx as above and ID below SCDs for DVT Px  INFECTIOUS A:  Colitis apparent on CT, hemodynamically stable post-arrest,  unlikely sepsis P:   Cultures and antibiotics as above Check C.diff PCR Consider cultures  ENDOCRINE  A:  Poorly controlled DM (last A1c 9.3 06/2013) Hx hypothyroidism - on Synthroid 25 mcg at home, TSH 0.015 here, ?sick euthyroid P:   SSI Consider starting Lantus (on 20 daily at home) Continue Synthroid, consider hold and free T4 check  NEUROLOGIC/PSYCH A:  Lethargy s/p arrest, given Narcan but only had Fentanyl x1 ~8 hours prior to arrest Hx peripheral neuropathy Hx depression P:   Monitor clinically Morphine PRN pain, watch for sedation. Home Zoloft  Please see also pending addendum / revision / finalization by PCCM fellow Dr. Catha Gosselin.  Bobbye Morton, MD PGY-2, [facility] 09/18/2013, 9:32 PM    Patient seen and examined with Dr. Casper [name] , discussed the labs and imaging .  Respiroetry arrest most likley due to hyperventilation , severe respiratory alkalosis and maybe Vasovagal . Severe colitis, check C diff and C/W abx and wait for cx . NG tube if the patient continues to vomit .

## 2013-09-18 NOTE — Progress Notes (Signed)
Pt c/o nausea and vomiting since her arrival from the ED. Zofran given last around 1530 (see MAR). At about 1815 pt's husband called for help saying the patient had stopped breathing. Pt had pulse, but no breathing. Code blue was called. After 3 chest compressions the patient began to cough and breath followed by general lethargy. Pt was placed on o2, given 0.4 Narcan IV, and transferred to ICU. EKG and labs were ordered stat.

## 2013-09-18 NOTE — ED Notes (Signed)
Patient transported to CT 

## 2013-09-18 NOTE — H&P (Signed)
[facility] History and Physical  YMANI PORCHER ZOX:[id number] DOB: 01-16-1961 DOA: 09/18/2013  Referring physician: Irish Elders, ER nurse practitioner PCP: Kerby Nora, MD  Specialists: None  Chief Complaint: Nausea vomiting abdominal pain  HPI: [name] [name] is a 52 y.o. female  Past oral history poorly controlled diabetes mellitus, hypertension and irritable bowel syndrome who presents to the emergency room with sudden onset abdominal cramping with secondary nausea and vomiting and some episodes of diarrhea that started in the early morning hours of 10/10. Patient previously well although she is a history of IBS and started trying to taking her medications to help this. She had a colonoscopy coming up shortly, when the symptoms started. Pain described as generalized. She came in the emergency room to be evaluated. There is CT scan of the abdomen and pelvis noted a nonspecific diffuse colitis. In addition her white blood cell count was elevated at 19.8 with shift. Patient had no fever. Patient was started on IV fluids and given anti-emetics. Hospitalists were called for further evaluation and admission. I recommended starting Cipro and Flagyl.  Review of Systems: Patient seen in emergency room. She's a little bit better. She denies any headaches or vision changes, dysphasia, chest pain palpitations, shortness of breath, wheeze, cough. She does complain some mild abdominal pain, but is feeling better after receiving medicine for pain and nausea. Still some tenderness. Denies any constipation. Had some diarrhea earlier but not now she denied any blood or dark black stool. Denies any hematuria, dysuria, focal extremity weakness or pain. She does complain of some chronic lower extremity numbness from neuropathy. Her review of systems otherwise negative.  Past Medical History  Diagnosis Date  . Osteoporosis   . Intermittent vertigo   . B12 deficiency   . Peripheral neuropathy   . GERD  (gastroesophageal reflux disease)   . Anxiety   . Diabetes mellitus   . Migraine   . Hyperlipidemia   . Hypothyroidism   . IBS (irritable bowel syndrome)   . Hypertension    Past Surgical History  Procedure Laterality Date  . Cesarean section  1989  . Tubal ligation      bilateral   . Laparoscopic assisted vaginal hysterectomy  09/23/2012    Procedure: LAPAROSCOPIC ASSISTED VAGINAL HYSTERECTOMY;  Surgeon: Mitchel Honour, DO;  Location: [facility];  Service: Gynecology;  Laterality: N/A;  pull Dr Vance Gather instrument   Social History:  reports that she quit smoking about 22 years ago. Her smoking use included Cigarettes. She smoked 0.00 packs per day. She has never used smokeless tobacco. She reports that she does not drink alcohol or use illicit drugs. Patient lives at home with her husband. She is able to participate in most activities of daily living without assistance  Allergies  Allergen Reactions  . Codeine Hives, Anxiety and Other (See Comments)    REACTION: N \\T \ V and hyper. Pt can take Percocet.    Family History  Problem Relation Age of Onset  . Heart failure Mother   . Diabetes Maternal Grandmother   . Alzheimer's disease Maternal Grandmother   . Coronary artery disease Maternal Grandmother   . Heart attack Maternal Grandfather     Prior to Admission medications   Medication Sig Start Date End Date Taking? Authorizing Provider  Cetirizine HCl (ZYRTEC ALLERGY) 10 MG CAPS Take 1 capsule by mouth daily as needed (allergies).   Yes Historical Provider, MD  dicyclomine (BENTYL) 20 MG tablet Take 20 mg by mouth 2 (two) times  daily.   Yes Historical Provider, MD  Insulin Glargine (LANTUS SOLOSTAR) 100 UNIT/ML SOPN Inject 20 Units into the skin every morning.    Yes Historical Provider, MD  levothyroxine (SYNTHROID, LEVOTHROID) 200 MCG tablet Take 200 mcg by mouth daily before breakfast.   Yes Historical Provider, MD  levothyroxine (SYNTHROID, LEVOTHROID) 25 MCG tablet Take 25 mcg  by mouth daily before breakfast.   Yes Historical Provider, MD  omeprazole (PRILOSEC) 40 MG capsule Take 40 mg by mouth daily.   Yes Historical Provider, MD  rosuvastatin (CRESTOR) 40 MG tablet Take 20 mg by mouth daily after supper.    Yes Historical Provider, MD  sertraline (ZOLOFT) 50 MG tablet Take 50 mg by mouth daily.   Yes Historical Provider, MD  Insulin Pen Needle (RA PEN NEEDLES) 31G X 8 MM MISC Use as directed daily 06/24/13   Amy Michelle Nasuti, MD   Physical Exam: Filed Vitals:   09/18/13 1355  BP: 106/62  Pulse: 101  Temp:   Resp: 18     General:  Alert and oriented x3, no acute distress  Eyes: Sclera nonicteric, extraocular movements are intact  ENT: Normocephalic and atraumatic, mucous members are slightly dry  Neck: Supple, no JVD  Cardiovascular:  Regular rate and rhythm, S1-S2  Respiratory: Clear to auscultation bilaterally  Abdomen: Soft, mildly distended, generalized nonspecific tenderness, hyperactive bowel sounds  Skin: No skin breaks, tears or lesions  Musculoskeletal: No clubbing or cyanosis, trace pitting edema bilaterally  Psychiatric: Patient is appropriate, no evidence of psychoses  Neurologic: No overt deficits, patient has chronic peripheral neuropathy  Labs on Admission:  Basic Metabolic Panel:  Recent Labs Lab 09/18/13 1100  NA 137  K 4.4  CL 99  CO2 19  GLUCOSE 219*  BUN 13  CREATININE 0.65  CALCIUM 10.0   Liver Function Tests:  Recent Labs Lab 09/18/13 1100  AST 14  ALT 12  ALKPHOS 87  BILITOT 0.4  PROT 8.0  ALBUMIN 4.1    Recent Labs Lab 09/18/13 1100  LIPASE 29   No results found for this basename: AMMONIA,  in the last 168 hours CBC:  Recent Labs Lab 09/18/13 1100  WBC 19.6*  NEUTROABS 17.2*  HGB 15.5*  HCT 45.0  MCV 78.7  PLT 405*   Cardiac Enzymes: No results found for this basename: CKTOTAL, CKMB, CKMBINDEX, TROPONINI,  in the last 168 hours  BNP (last 3 results) No results found for this  basename: PROBNP,  in the last 8760 hours CBG: No results found for this basename: GLUCAP,  in the last 168 hours  Radiological Exams on Admission: Ct Abdomen Pelvis W Contrast  09/18/2013   CLINICAL DATA:  Diffuse abdominal pain. Emesis. Diarrhea.  EXAM: CT ABDOMEN AND PELVIS WITH CONTRAST  TECHNIQUE: Multidetector CT imaging of the abdomen and pelvis was performed using the standard protocol following bolus administration of intravenous contrast.  CONTRAST:  OMNIPAQUE IOHEXOL 300 MG/ML  SOLN  COMPARISON:  07/13/2013  FINDINGS: Stable small hypodense lesion in segment 3 of the liver.  Spleen, adrenal glands, and pancreas normal.  Mild extrahepatic biliary dilatation with CBD at 7 mm. Focal hyperdensity in the fundus of the gallbladder observed with a small umbilication along the luminal margin (image 26 of series 5) and a small amount of fluid density anterior to the lesion. Similar appearance back through 02/11/2008 suggests that this may represent a Phrygian cap with some heaping of mucosa, although a small gallbladder polyp is not excluded. Reviewing the  prior ultrasound from 03/10/2008, I do not see an obvious abnormality in this vicinity.  Kidneys and proximal ureters unremarkable. No pathologic upper abdominal adenopathy is observed.  Scattered colonic diverticula noted. Appendix unremarkable. Mild wall thickening in the colon is somewhat diffuse.  Small umbilical hernia contains adipose tissue. No pathologic pelvic adenopathy is observed. Fluid density lesion along the inferior margin of the left ovary measures 2.4 x 2.0 x 1.8 cm, new from prior.  IMPRESSION: 1. Diffuse colon wall thickening favoring colitis. Adipose deposition in the proximal colonic wall, can be incidental but can also indicates long-standing process or inflammatory bowel disease. 2. Scattered colonic diverticula. 3. Mild extrahepatic biliary dilatation. 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 4. Benign appearing hypodense lesion in the lateral segment left hepatic lobe. This appears stable.   Electronically Signed   By: Herbie Baltimore M.D.   On: 09/18/2013 12:40      Assessment/Plan Principal Problem:   Colitis, indeterminate: Suspect likely infectious, given elevated white blood cell count and acuity of symptoms. N.p.o. plus IV Cipro and Flagyl. Repeat blood work in morning, and if patient is not feeling better her white blood cell count persisting, consult gastroenterology.  Active Problems:   HYPOTHYROIDISM: Stable. Continue Synthroid.    Type II or unspecified type diabetes mellitus without mention of complication, uncontrolled: Check A1c. Since on clear liquids, only every 4 hours sliding-scale.    HYPERLIPIDEMIA: Stable. Holding home meds   ANXIETY DISORDER, GENERALIZED: Stable for now.    PERIPHERAL NEUROPATHY   GERD: Holding home meds   Irritable bowel syndrome   HYPERTENSION: Stable.    TOBACCO ABUSE: Patient quit many years ago.   Hepatic lesion: Noted on CT. Incidental. Seen on previous films.    Diverticulosis: Incidentally noted on CT. Quiet.   Code Status: Patient and her husband confirmed a full code  Family Communication: Plan discussed with patient and her husband at the bedside.  Disposition Plan: Home in a few days  Time spent: 25 minutes  Hollice Espy [facility] Pager [phone]  If 7PM-7AM, please contact night-coverage [email or web address] Password [email or web address] 09/18/2013, 3:20 PM

## 2013-09-18 NOTE — Code Documentation (Signed)
Code blue called at 1821, Patient was unresponsive, never lost a pulse.  Became responsive within a few seconds,  Upon my arrival to room, Patient was alert and lethargic, c/o abdominal pain.  VSS pulse ox 99 on 2 lpm nasal cannula.  MD updated.  Orders to give narcan and transfer to ICU.  EKG done. Rn to call if assistance needed

## 2013-09-18 NOTE — ED Provider Notes (Signed)
CSN: [id number]     Arrival date & time 09/18/13  1051 History   First MD Initiated Contact with Patient 09/18/13 1053     Chief Complaint  Patient presents with  . Emesis  . Diarrhea   (Consider location/radiation/quality/duration/timing/severity/associated sxs/prior Treatment) Patient is a 52 y.o. female presenting with vomiting and diarrhea. The history is provided by the patient. No language interpreter was used.  Emesis Duration:  9 hours Number of daily episodes:  12 Quality:  Bilious material Progression:  Worsening Recent urination:  Normal Associated symptoms: abdominal pain and diarrhea   Associated symptoms: no chills   Diarrhea:    Quality:  Semi-solid   Number of occurrences:  10 Risk factors: diabetes   Risk factors: no alcohol use, no prior abdominal surgery, no sick contacts, no suspect food intake and no travel to endemic areas   Diarrhea Associated symptoms: abdominal pain and vomiting   Associated symptoms: no chills and no fever    Patient is a 51 year old female presenting with vomiting and diarrhea that started abruptly at 2:00 this morning. She reports that she has had 10-12 episodes of vomiting and diarrhea. Denies blood in her vomit or in the stool. She denies fever, chills, recent sick exposure or exposure to bad food. She reports abdominal tenderness and cramping with vomiting and diarrhea simultaneously. She had an episode like this back in August, was seen in the ER at that time with CT that showed diverticulosis. She had complete resolution of symptoms since August and had return of symptoms this morning.  Past Medical History  Diagnosis Date  . Osteoporosis   . Intermittent vertigo   . B12 deficiency   . Peripheral neuropathy   . GERD (gastroesophageal reflux disease)   . Anxiety   . Diabetes mellitus   . Migraine   . Hyperlipidemia   . Hypothyroidism   . IBS (irritable bowel syndrome)    Past Surgical History  Procedure Laterality Date  .  Cesarean section  1989  . Tubal ligation      bilateral   . Laparoscopic assisted vaginal hysterectomy  09/23/2012    Procedure: LAPAROSCOPIC ASSISTED VAGINAL HYSTERECTOMY;  Surgeon: Mitchel Honour, DO;  Location: [facility];  Service: Gynecology;  Laterality: N/A;  pull Dr Vance Gather instrument   Family History  Problem Relation Age of Onset  . Heart failure Mother   . Diabetes Maternal Grandmother   . Alzheimer's disease Maternal Grandmother   . Coronary artery disease Maternal Grandmother   . Heart attack Maternal Grandfather    History  Substance Use Topics  . Smoking status: Former Smoker    Types: Cigarettes    Quit date: 12/10/1990  . Smokeless tobacco: Never Used     Comment: 1/2 pack per day  . Alcohol Use: No   OB History   Grav Para Term Preterm Abortions TAB SAB Ect Mult Living                 Review of Systems  Constitutional: Negative for fever and chills.  Gastrointestinal: Positive for vomiting, abdominal pain and diarrhea. Negative for blood in stool.  Genitourinary: Negative for frequency, hematuria and difficulty urinating.  All other systems reviewed and are negative.    Allergies  Codeine  Home Medications   Current Outpatient Rx  Name  Route  Sig  Dispense  Refill  . Cetirizine HCl (ZYRTEC ALLERGY) 10 MG CAPS   Oral   Take 1 capsule (10 mg total) by mouth at bedtime.  30 capsule   3   . dicyclomine (BENTYL) 20 MG tablet      Take 1 tab twice daily.   60 tablet   1   . Insulin Glargine (LANTUS SOLOSTAR) 100 UNIT/ML SOPN   Subcutaneous   Inject 20 Units into the skin every morning.          . Insulin Pen Needle (RA PEN NEEDLES) 31G X 8 MM MISC      Use as directed daily   100 each   3   . levothyroxine (SYNTHROID, LEVOTHROID) 200 MCG tablet      TAKE 1 TABLET BY MOUTH DAILY TAKE ALONG WITH THE 25 MG DOSE.   30 tablet   5   . levothyroxine (SYNTHROID, LEVOTHROID) 25 MCG tablet      TAKE 1 TABLET BY MOUTH DAILY IN ADDITION TO THE 200  MCG TABLET   30 tablet   5   . omeprazole (PRILOSEC) 40 MG capsule   Oral   Take 1 capsule (40 mg total) by mouth daily.   30 capsule   3   . rosuvastatin (CRESTOR) 40 MG tablet   Oral   Take 20 mg by mouth daily after supper.          . sertraline (ZOLOFT) 50 MG tablet      TAKE 1 TABLET BY MOUTH EVERY DAY   30 tablet   2    BP 150/91  Pulse 90  Temp(Src) 97.9 F (36.6 C) (Oral)  Resp 24  SpO2 100%  LMP 08/11/2012 Physical Exam  Nursing note and vitals reviewed. Constitutional: She is oriented to person, place, and time. She appears well-developed.  HENT:  Head: Normocephalic and atraumatic.  Mouth/Throat: Oropharynx is clear and moist.  Eyes: Pupils are equal, round, and reactive to light.  Neck: Normal range of motion. Neck supple. No JVD present. No tracheal deviation present. No thyromegaly present.  Cardiovascular: Normal rate, regular rhythm, normal heart sounds and intact distal pulses.   Pulmonary/Chest: Effort normal and breath sounds normal.  Abdominal: Soft. Bowel sounds are normal. There is no hepatosplenomegaly. There is tenderness. There is no rigidity, no rebound, no guarding, no tenderness at McBurney's point and negative Murphy's sign.  Generalized tenderness throughout abdomen.  Musculoskeletal: Normal range of motion.  Neurological: She is alert and oriented to person, place, and time.  Skin: Skin is warm and dry.  Psychiatric: Her behavior is normal. Judgment and thought content normal. Her mood appears anxious.    ED Course  Procedures (including critical care time) Labs Review Labs Reviewed  CBC WITH DIFFERENTIAL  COMPREHENSIVE METABOLIC PANEL  LIPASE, BLOOD   Imaging Review No results found.  EKG Interpretation   None      1:12 PM Re-evalaution, pt ambulated to restroom. Sitting up in bed, states some relief after meds and fluids. Call to triad hospitalist for possible in-patient treatment.  MDM   1. Colitis    Previous  episode of vomiting and diarrhea in August with CT showing diverticulosis, inflammatory changes. Complete resolution of symptoms until this morning. Abrupt onset of vomiting and diarrhea. Abdominal CT results today: colon wall thickening, representative of colitis and diverticula. Admit to med team 10, med-surg bed.. IV zofran, fluids, flagyl and cipro.     Irish Elders, NP 09/18/13 1511

## 2013-09-19 ENCOUNTER — Inpatient Hospital Stay (HOSPITAL_COMMUNITY): Payer: 59

## 2013-09-19 ENCOUNTER — Other Ambulatory Visit (HOSPITAL_COMMUNITY): Payer: 59

## 2013-09-19 DIAGNOSIS — J9600 Acute respiratory failure, unspecified whether with hypoxia or hypercapnia: Secondary | ICD-10-CM

## 2013-09-19 LAB — CBC
HCT: 40.4 % (ref 36.0–46.0)
MCH: 27.2 pg (ref 26.0–34.0)
MCV: 78.6 fL (ref 78.0–100.0)
Platelets: 345 10*3/uL (ref 150–400)
RDW: 15.5 % (ref 11.5–15.5)
WBC: 15.7 10*3/uL — ABNORMAL HIGH (ref 4.0–10.5)

## 2013-09-19 LAB — COMPREHENSIVE METABOLIC PANEL
ALT: 9 U/L (ref 0–35)
AST: 11 U/L (ref 0–37)
Albumin: 3.2 g/dL — ABNORMAL LOW (ref 3.5–5.2)
CO2: 21 mEq/L (ref 19–32)
Chloride: 103 mEq/L (ref 96–112)
GFR calc non Af Amer: >90 mL/min (ref >90)
Sodium: 140 mEq/L (ref 135–145)
Total Bilirubin: 0.3 mg/dL (ref 0.3–1.2)

## 2013-09-19 LAB — GLUCOSE, CAPILLARY
Glucose-Capillary: 108 mg/dL — ABNORMAL HIGH (ref 70–99)
Glucose-Capillary: 111 mg/dL — ABNORMAL HIGH (ref 70–99)
Glucose-Capillary: 116 mg/dL — ABNORMAL HIGH (ref 70–99)
Glucose-Capillary: 138 mg/dL — ABNORMAL HIGH (ref 70–99)
Glucose-Capillary: 164 mg/dL — ABNORMAL HIGH (ref 70–99)

## 2013-09-19 LAB — LACTIC ACID, PLASMA: Lactic Acid, Venous: 0.9 mmol/L (ref 0.5–2.2)

## 2013-09-19 LAB — BLOOD GAS, ARTERIAL
Bicarbonate: 22.5 mEq/L (ref 20.0–24.0)
O2 Content: 2 L/min
TCO2: 23.5 mmol/L (ref 0–100)
pCO2 arterial: 32.6 mmHg — ABNORMAL LOW (ref 35.0–45.0)
pH, Arterial: 7.45 (ref 7.350–7.450)
pO2, Arterial: 137 mmHg — ABNORMAL HIGH (ref 80.0–100.0)

## 2013-09-19 MED ORDER — PANTOPRAZOLE SODIUM 40 MG PO TBEC
40.0000 mg | DELAYED_RELEASE_TABLET | Freq: Every day | ORAL | Status: DC
  Administered 2013-09-19: 40 mg via ORAL
  Filled 2013-09-19: qty 1

## 2013-09-19 MED ORDER — ATORVASTATIN CALCIUM 80 MG PO TABS
80.0000 mg | ORAL_TABLET | Freq: Every day | ORAL | Status: DC
  Administered 2013-09-19: 80 mg via ORAL
  Filled 2013-09-19 (×2): qty 1

## 2013-09-19 MED ORDER — ACETAMINOPHEN 325 MG PO TABS
650.0000 mg | ORAL_TABLET | Freq: Once | ORAL | Status: AC
  Administered 2013-09-19: 650 mg via ORAL
  Filled 2013-09-19: qty 2

## 2013-09-19 MED ORDER — ASPIRIN EC 81 MG PO TBEC
81.0000 mg | DELAYED_RELEASE_TABLET | Freq: Every day | ORAL | Status: DC
  Administered 2013-09-19 – 2013-09-20 (×2): 81 mg via ORAL
  Filled 2013-09-19 (×2): qty 1

## 2013-09-19 MED ORDER — POTASSIUM CHLORIDE 10 MEQ/100ML IV SOLN
10.0000 meq | INTRAVENOUS | Status: AC
  Administered 2013-09-19 (×6): 10 meq via INTRAVENOUS
  Filled 2013-09-19: qty 100

## 2013-09-19 MED ORDER — HEPARIN SODIUM (PORCINE) 5000 UNIT/ML IJ SOLN
5000.0000 [IU] | Freq: Two times a day (BID) | INTRAMUSCULAR | Status: DC
  Administered 2013-09-19 – 2013-09-20 (×3): 5000 [IU] via SUBCUTANEOUS
  Filled 2013-09-19 (×4): qty 1

## 2013-09-19 MED ORDER — PANTOPRAZOLE SODIUM 40 MG IV SOLR
40.0000 mg | Freq: Every day | INTRAVENOUS | Status: DC
  Administered 2013-09-19: 40 mg via INTRAVENOUS
  Filled 2013-09-19 (×3): qty 40

## 2013-09-19 MED ORDER — IOHEXOL 350 MG/ML SOLN
100.0000 mL | Freq: Once | INTRAVENOUS | Status: AC | PRN
  Administered 2013-09-19: 100 mL via INTRAVENOUS

## 2013-09-19 MED ORDER — SODIUM CHLORIDE 0.9 % IV SOLN
INTRAVENOUS | Status: DC
  Administered 2013-09-19: 100 mL via INTRAVENOUS

## 2013-09-19 NOTE — Progress Notes (Signed)
Patient transferred to 5W room 17.  Patient oriented to unit and room.  Vitals obtained.  Assessed patient.  Vitals WDL and assessment charted in Epic.  Complaints of HA.  MD paged.

## 2013-09-19 NOTE — Progress Notes (Signed)
PULMONARY  / CRITICAL CARE MEDICINE  Name: [name] [name] MRN: [id number] DOB: 28-Oct-1961    ADMISSION DATE:  09/18/2013 CONSULTATION DATE:  09/18/2013  REFERRING MD :  Dr. Rito Ehrlich [facility]) PRIMARY SERVICE:  PCCM  CHIEF COMPLAINT:  Post Code Blue management after respiratory arrest  BRIEF PATIENT DESCRIPTION:    52 yo female with past medical history of poorly controlled DM, HTN, IBS brought to ED with crampy abdominal pain.  Admitted to hospitalist service with diagnosis of nonspecific colitis shown on CT with WBC 19.8. Pt became unresponsive around 1820 on 10/10 and code blue was called for respiratory arrest but never lost pulse (was given fentanyl several hours earlier) Pt became responsive after 3 chest compressions and was transferred to the ICU for PCCM management. Currently still complaining of abd pain, but no breathing difficulty, no chest pain. Still occasionally vomiting and complaining of cough. Reports pain has been going on "for a few days," and had diarrhea yesterday without BM today.   has a past medical history of Osteoporosis; Intermittent vertigo; B12 deficiency; Peripheral neuropathy; GERD (gastroesophageal reflux disease); Anxiety; Hyperlipidemia; Hypothyroidism; IBS (irritable bowel syndrome); Hypertension; Leg cramping; Type II diabetes mellitus; Umbilical hernia; and Migraine.   has past surgical history that includes Cesarean section (1989); Laparoscopic assisted vaginal hysterectomy (09/23/2012); Tubal ligation (Bilateral, 1992); and Vaginal hysterectomy (2013).   LINES / TUBES: PIV  CULTURES: none  ANTIBIOTICS: Cipro 10/10 >>> Flagyl 10/10 >>>   SIGNIFICANT EVENTS / STUDIES:  10/10 - admitted with crampy abd pain, WBC 19.8, colitis on abd/pelvic CT (diffuse wall thickening, adipose deposition in proximal colonic wall, scattered diverticulae, mild extrahepatic biliary dilation, benign stable-appearing left hepatic lobe lesion) 10/10 @1820  - arrest without  loss of pulse, given Narcan, transferred to ICU    SUBJECTIVE/OVERNIGHT/INTERVAL HX  09/19/13: Denies pain. Rememebers episode and CPR yesterday. Says she had acute abd pain and then as a reaction to that passed out. Denies dyspnea, cough, hemoptysis, chest pain, leg swelling, no prior DVt. CT angio spring 2014  - neg  For PE. Denies current diarrhea or abd pain.        VITAL SIGNS: Temp:  [96.9 F (36.1 C)-98.6 F (37 C)] 98 F (36.7 C) (10/11 0758) Pulse Rate:  [81-118] 89 (10/11 0700) Resp:  [10-33] 19 (10/11 0700) BP: (97-158)/(48-114) 103/62 mmHg (10/11 0700) SpO2:  [95 %-100 %] 96 % (10/11 0700) Weight:  [66.679 kg (147 lb)] 66.679 kg (147 lb) (10/10 2021) HEMODYNAMICS:   VENTILATOR SETTINGS:   INTAKE / OUTPUT: Intake/Output     10/10 0701 - 10/11 0700 10/11 0701 - 10/12 0700   I.V. (mL/kg) 1525 (22.9)    IV Piggyback 800 100   Total Intake(mL/kg) 2325 (34.9) 100 (1.5)   Urine (mL/kg/hr) 475    Emesis/NG output 400    Total Output 875     Net +1450 +100          PHYSICAL EXAMINATION: General:Liying bed. Looks well Neuro: awake/alert, nonfocal HEENT:  PERRL, mucous membranes moist Cardiovascular:  RRR, no m/r/g Lungs:  Bilateral diminished air entry but clear, no w/r/r Abdomen:  Soft, NON tender, bowel sounds diminished but present Musculoskeletal:  Moves all extremities, no edema Skin:  Intact  LABS: PULMONARY  Recent Labs Lab 09/18/13 1927 09/19/13 0039  PHART 7.627* 7.450  PCO2ART 17.3* 32.6*  PO2ART 134.0* 137.0*  HCO3 18.1* 22.5  TCO2 19 23.5  O2SAT 100.0 99.2    CBC  Recent Labs Lab 09/18/13 1100 09/18/13 2005 09/19/13  0100  HGB 15.5* 14.8 14.0  HCT 45.0 41.4 40.4  WBC 19.6* 20.0* 15.7*  PLT 405* 368 345    COAGULATION No results found for this basename: INR,  in the last 168 hours  CARDIAC   Recent Labs Lab 09/18/13 2000 09/19/13 0100  TROPONINI <0.30 <0.30   No results found for this basename: PROBNP,  in the last  168 hours   CHEMISTRY  Recent Labs Lab 09/18/13 1100 09/18/13 2005 09/19/13 0100  NA 137 135 140  K 4.4 4.1 3.2*  CL 99 99 103  CO2 19 19 21   GLUCOSE 219* 236* 153*  BUN 13 9 10   CREATININE 0.65 0.56 0.56  CALCIUM 10.0 9.0 8.7   Estimated Creatinine Clearance: 70.8 ml/min (by C-G formula based on Cr of 0.56).   LIVER  Recent Labs Lab 09/18/13 1100 09/19/13 0100  AST 14 11  ALT 12 9  ALKPHOS 87 72  BILITOT 0.4 0.3  PROT 8.0 7.0  ALBUMIN 4.1 3.2*     INFECTIOUS No results found for this basename: LATICACIDVEN, PROCALCITON,  in the last 168 hours   ENDOCRINE CBG (last 3)   Recent Labs  09/18/13 2033 09/19/13 0009 09/19/13 0407  GLUCAP 229* 164* 132*         IMAGING x48h  Ct Abdomen Pelvis W Contrast  09/18/2013   CLINICAL DATA:  Diffuse abdominal pain. Emesis. Diarrhea.  EXAM: CT ABDOMEN AND PELVIS WITH CONTRAST  TECHNIQUE: Multidetector CT imaging of the abdomen and pelvis was performed using the standard protocol following bolus administration of intravenous contrast.  CONTRAST:  OMNIPAQUE IOHEXOL 300 MG/ML  SOLN  COMPARISON:  07/13/2013  FINDINGS: Stable small hypodense lesion in segment 3 of the liver.  Spleen, adrenal glands, and pancreas normal.  Mild extrahepatic biliary dilatation with CBD at 7 mm. Focal hyperdensity in the fundus of the gallbladder observed with a small umbilication along the luminal margin (image 26 of series 5) and a small amount of fluid density anterior to the lesion. Similar appearance back through 02/11/2008 suggests that this may represent a Phrygian cap with some heaping of mucosa, although a small gallbladder polyp is not excluded. Reviewing the prior ultrasound from 03/10/2008, I do not see an obvious abnormality in this vicinity.  Kidneys and proximal ureters unremarkable. No pathologic upper abdominal adenopathy is observed.  Scattered colonic diverticula noted. Appendix unremarkable. Mild wall thickening in the  colon is somewhat diffuse.  Small umbilical hernia contains adipose tissue. No pathologic pelvic adenopathy is observed. Fluid density lesion along the inferior margin of the left ovary measures 2.4 x 2.0 x 1.8 cm, new from prior.  IMPRESSION: 1. Diffuse colon wall thickening favoring colitis. Adipose deposition in the proximal colonic wall, can be incidental but can also indicates long-standing process or inflammatory bowel disease. 2. Scattered colonic diverticula. 3. Mild extrahepatic biliary dilatation. 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 4. Benign appearing hypodense lesion in the lateral segment left hepatic lobe. This appears stable.   Electronically Signed   By: Herbie Baltimore M.D.   On: 09/18/2013 12:40      ASSESSMENT / PLAN:  PULMONARY A:  Respiratory arrest - resolved, uncertain cause  - Likely pain vasovagal and resp alk mediated. Need to rule out PE  P:   CT angio rule out PE Goal SpO2>92, pH>7.30   CARDIOVASCULAR A: Intermittent mild tachycardia related to nausea EKG with sinus arrythmia - ?  If occult arrhythmia contributed to arrest HTN, HLD  09/19/13: No MI and in NSR P:  ASA Lipitor for home Crestor Check bnp  RENAL A:  No acute issue P:   Goal CVP>10 Trend BMP NS@100  mL/h  GASTROINTESTINAL A:  Acute colitis with nausea/vomiting and abd pain - pain improved but persistent vomiting Extrahepatic biliary dilation on CT Left liver lobe lesion (benign/stable appearance?) on CT Hx GERD Hx IBS  09/19/13: CT shows colitis. C Diff test pending. LFT normal P:   Advance diet Phenergan PRN Protonix for GI Px (also on omeprazole at home) Consider restart home Bentyl when taking PO   HEMATOLOGIC A:  Leukocytosis i? COlitis related  P:  Trend CBC - see cultures / abx as above and ID below  SQ heparin  for DVT Px  INFECTIOUS A:  Colitis apparent on CT, hemodynamically  stable post-arrest, unlikely sepsis P:   Cultures and antibiotics as above Check C.diff PCR Consider cultures  ENDOCRINE  A:  Poorly controlled DM (last A1c 9.3 06/2013) Hx hypothyroidism - on Synthroid 25 mcg at home, TSH 0.015 here, ?sick euthyroid  P:   SSI Consider starting Lantus (on 20 daily at home) Continue Synthroid, consider hold and free T4 check  NEUROLOGIC/PSYCH A:  Lethargy s/p arrest, given Narcan but only had Fentanyl x1 ~8 hours prior to arrest Hx peripheral neuropathy Hx depression  09/19/13: Normal mental status post CPR. Remembers CPR  P:   Monitor clinically Fentanyl PRN pain, watch for sedation. Home Zoloft    GLOBAL 09/19/13: No family at bedside. Move to floor tele. Get CT angio chest. TRH to assume primary 09/20/13   Dr. Kalman Shan, M.D., F.C.C.P Pulmonary and Critical Care Medicine Staff Physician [facility] Pager: [phone], If no answer or between  15:00h - 7:00h: call [phone]  09/19/2013 8:23 AM

## 2013-09-19 NOTE — ED Provider Notes (Signed)
Medical screening examination/treatment/procedure(s) were conducted as a shared visit with non-physician practitioner(s) and myself.  I personally evaluated the patient during the encounter  .Face to face Exam:  General:  A&Ox3 HEENT:  Atraumatic Resp:  Normal effort Abd:  Nondistended.  Left lower quadrant tenderness to palpation Neuro:No focal deficits     Nelia Shi, MD 09/19/13 1121

## 2013-09-20 DIAGNOSIS — D72829 Elevated white blood cell count, unspecified: Secondary | ICD-10-CM

## 2013-09-20 DIAGNOSIS — K219 Gastro-esophageal reflux disease without esophagitis: Secondary | ICD-10-CM

## 2013-09-20 DIAGNOSIS — R42 Dizziness and giddiness: Secondary | ICD-10-CM

## 2013-09-20 DIAGNOSIS — K589 Irritable bowel syndrome without diarrhea: Secondary | ICD-10-CM

## 2013-09-20 LAB — CBC WITH DIFFERENTIAL/PLATELET
Basophils Absolute: 0 10*3/uL (ref 0.0–0.1)
Basophils Relative: 0 % (ref 0–1)
Eosinophils Absolute: 0.2 10*3/uL (ref 0.0–0.7)
Eosinophils Relative: 1 % (ref 0–5)
HCT: 37.8 % (ref 36.0–46.0)
MCH: 27.5 pg (ref 26.0–34.0)
MCHC: 34.7 g/dL (ref 30.0–36.0)
MCV: 79.4 fL (ref 78.0–100.0)
Monocytes Absolute: 0.8 10*3/uL (ref 0.1–1.0)
Platelets: 274 10*3/uL (ref 150–400)
RDW: 16 % — ABNORMAL HIGH (ref 11.5–15.5)
WBC: 12.4 10*3/uL — ABNORMAL HIGH (ref 4.0–10.5)

## 2013-09-20 LAB — PROTIME-INR: Prothrombin Time: 13.6 seconds (ref 11.6–15.2)

## 2013-09-20 LAB — MAGNESIUM: Magnesium: 1.6 mg/dL (ref 1.5–2.5)

## 2013-09-20 LAB — BASIC METABOLIC PANEL
BUN: 8 mg/dL (ref 6–23)
Calcium: 8.5 mg/dL (ref 8.4–10.5)
Creatinine, Ser: 0.69 mg/dL (ref 0.50–1.10)
GFR calc Af Amer: >90 mL/min (ref >90)
GFR calc non Af Amer: >90 mL/min (ref >90)
Potassium: 3.6 mEq/L (ref 3.5–5.1)

## 2013-09-20 LAB — PROCALCITONIN: Procalcitonin: 0.44 ng/mL

## 2013-09-20 LAB — GLUCOSE, CAPILLARY: Glucose-Capillary: 88 mg/dL (ref 70–99)

## 2013-09-20 LAB — LACTIC ACID, PLASMA: Lactic Acid, Venous: 0.8 mmol/L (ref 0.5–2.2)

## 2013-09-20 MED ORDER — METRONIDAZOLE 500 MG PO TABS: 500.0000 mg | ORAL_TABLET | Freq: Three times a day (TID) | ORAL | Status: DC

## 2013-09-20 MED ORDER — CIPROFLOXACIN HCL 500 MG PO TABS: 500.0000 mg | ORAL_TABLET | Freq: Two times a day (BID) | ORAL | Status: DC

## 2013-09-20 MED ORDER — ACETAMINOPHEN 325 MG PO TABS: 650.0000 mg | ORAL_TABLET | Freq: Four times a day (QID) | ORAL | Status: DC | PRN

## 2013-09-20 MED ORDER — ONDANSETRON HCL 4 MG/2ML IJ SOLN
4.0000 mg | Freq: Four times a day (QID) | INTRAMUSCULAR | Status: DC | PRN
  Administered 2013-09-20: 4 mg via INTRAVENOUS
  Filled 2013-09-20: qty 2

## 2013-09-20 MED ORDER — ONDANSETRON HCL 4 MG PO TABS: 4.0000 mg | ORAL_TABLET | Freq: Three times a day (TID) | ORAL | Status: DC | PRN

## 2013-09-20 MED FILL — Medication: Qty: 1 | Status: AC

## 2013-09-20 NOTE — Discharge Summary (Signed)
[facility]                                                                                   [name] [name], is a 52 y.o. female  DOB 03/22/1961  MRN [id number].  Admission date:  09/18/2013  Admitting Physician  Hollice Espy, MD  Discharge Date:  09/20/2013   Primary MD  Kerby Nora, MD  Admission Diagnosis  Unspecified hypothyroidism [244.9] Type II or unspecified type diabetes mellitus without mention of complication, uncontrolled [250.02] Abdominal pain, unspecified site [789.00] Colitis [558.9]  Discharge Diagnosis     Principal Problem:   Colitis, indeterminate Active Problems:   HYPOTHYROIDISM   Type II or unspecified type diabetes mellitus without mention of complication, uncontrolled   HYPERLIPIDEMIA   ANXIETY DISORDER, GENERALIZED   PERIPHERAL NEUROPATHY   GERD   Irritable bowel syndrome   HYPERTENSION   TOBACCO ABUSE   Hepatic lesion   Diverticulosis     Past Medical History  Diagnosis Date  . Osteoporosis   . Intermittent vertigo   . B12 deficiency   . Peripheral neuropathy   . GERD (gastroesophageal reflux disease)   . Anxiety   . Hyperlipidemia   . Hypothyroidism   . IBS (irritable bowel syndrome)   . Hypertension   . Leg cramping     "@ night" (09/18/2013)  . Type II diabetes mellitus   . Umbilical hernia     unrepaired (09/18/2013)  . Migraine     "once q couple months" (09/18/2013)    Past Surgical History  Procedure Laterality Date  . Cesarean section  1989  . Laparoscopic assisted vaginal hysterectomy  09/23/2012    Procedure: LAPAROSCOPIC ASSISTED VAGINAL HYSTERECTOMY;  Surgeon: Mitchel Honour, DO;  Location: [facility];  Service: Gynecology;  Laterality: N/A;  pull Dr Vance Gather instrument  . Tubal ligation Bilateral 1992  . Vaginal hysterectomy  2013     Recommendations for primary care physician for things to follow:   Please make sure patient follows with cardiology and GI has recommended below   Discharge Diagnoses:    Principal Problem:   Colitis, indeterminate Active Problems:   HYPOTHYROIDISM   Type II or unspecified type diabetes mellitus without mention of complication, uncontrolled   HYPERLIPIDEMIA   ANXIETY DISORDER, GENERALIZED   PERIPHERAL NEUROPATHY   GERD   Irritable bowel syndrome   HYPERTENSION   TOBACCO ABUSE   Hepatic lesion   Diverticulosis    Discharge Condition: Stable.   Follow-up Information   Follow up with Kerby Nora, MD. Schedule an appointment as soon as possible for a visit in 3 days.   Specialty:  Family Medicine   Contact information:   [address] [place] [address] [phone]       Follow up with Stan Head, MD. Schedule an appointment as soon as possible for a visit in 1 week. (colitis)    Specialty:  Gastroenterology   Contact information:   [address] [place] [address] [phone]       Follow up with Willa Rough, MD. Schedule an appointment as soon as possible for a visit in 1  week.   Specialty:  Cardiology   Contact information:   [address] [place] [address] [phone]         Consults obtained - PCCM   Discharge Medications      Medication List         ciprofloxacin 500 MG tablet  Commonly known as:  CIPRO  Take 1 tablet (500 mg total) by mouth 2 (two) times daily.     dicyclomine 20 MG tablet  Commonly known as:  BENTYL  Take 20 mg by mouth 2 (two) times daily.     Insulin Pen Needle 31G X 8 MM Misc  Commonly known as:  RA PEN NEEDLES  Use as directed daily     LANTUS SOLOSTAR 100 UNIT/ML Sopn  Generic drug:  Insulin Glargine  Inject 20 Units into the skin every morning.     levothyroxine 200 MCG tablet  Commonly known as:  SYNTHROID, LEVOTHROID  Take 200 mcg by mouth daily before breakfast.     levothyroxine 25 MCG tablet  Commonly known as:  SYNTHROID, LEVOTHROID  Take 25 mcg by mouth daily before breakfast.     metroNIDAZOLE  500 MG tablet  Commonly known as:  FLAGYL  Take 1 tablet (500 mg total) by mouth 3 (three) times daily.     omeprazole 40 MG capsule  Commonly known as:  PRILOSEC  Take 40 mg by mouth daily.     rosuvastatin 40 MG tablet  Commonly known as:  CRESTOR  Take 20 mg by mouth daily after supper.     sertraline 50 MG tablet  Commonly known as:  ZOLOFT  Take 50 mg by mouth daily.     ZYRTEC ALLERGY 10 MG Caps  Generic drug:  Cetirizine HCl  Take 1 capsule by mouth daily as needed (allergies).         Diet and Activity recommendation: See Discharge Instructions below   Discharge Instructions     Follow with Primary MD Kerby Nora, MD in 3 days   Get CBC, CMP, TSH checked 3 days by Primary MD and again as instructed by your Primary MD.    Get Medicines reviewed and adjusted.  Please request your Prim.MD to go over all Hospital Tests and Procedure/Radiological results at the follow up, please get all Hospital records sent to your Prim MD by signing hospital release before you go home.  Activity: As tolerated with Full fall precautions use walker/cane & assistance as needed   Diet:  Heart Healthy - Low Carb  For Heart failure patients - Check your Weight same time everyday, if you gain over 2 pounds, or you develop in leg swelling, experience more shortness of breath or chest pain, call your Primary MD immediately. Follow Cardiac Low Salt Diet and 1.8 lit/day fluid restriction.  Disposition Home    If you experience worsening of your admission symptoms, develop shortness of breath, life threatening emergency, suicidal or homicidal thoughts you must seek medical attention immediately by calling 911 or calling your MD immediately  if symptoms less severe.  You Must read complete instructions/literature along with all the possible adverse reactions/side effects for all the Medicines you take and that have been prescribed to you. Take any new Medicines after you have completely  understood and accpet all the possible adverse reactions/side effects.   Do not drive and provide baby sitting services if your were admitted for syncope or siezures until you have seen by Primary MD or a Neurologist  and advised to do so again.  Do not drive when taking Pain medications.    Do not take more than prescribed Pain, Sleep and Anxiety Medications  Special Instructions: If you have smoked or chewed Tobacco  in the last 2 yrs please stop smoking, stop any regular Alcohol  and or any Recreational drug use.  Wear Seat belts while driving.   Please note  You were cared for by a hospitalist during your hospital stay. If you have any questions about your discharge medications or the care you received while you were in the hospital after you are discharged, you can call the unit and asked to speak with the hospitalist on call if the hospitalist that took care of you is not available. Once you are discharged, your primary care physician will handle any further medical issues. Please note that NO REFILLS for any discharge medications will be authorized once you are discharged, as it is imperative that you return to your primary care physician (or establish a relationship with a primary care physician if you do not have one) for your aftercare needs so that they can reassess your need for medications and monitor your lab values.    Major procedures and Radiology Reports - PLEASE review detailed and final reports for all details, in brief -       Ct Angio Chest Pe W/cm &/or Wo Cm  09/19/2013   CLINICAL DATA:  53 year old female who became unresponsive, for which a code was called. Evaluate for reason for respiratory arrest. Patient in the hospital with diagnosis of nonspecific colitis.  EXAM: CT ANGIOGRAPHY CHEST WITH CONTRAST  TECHNIQUE: Multidetector CT imaging of the chest was performed using the standard protocol during bolus administration of intravenous contrast. Multiplanar CT image  reconstructions including MIPs were obtained to evaluate the vascular anatomy.  CONTRAST:  OMNIPAQUE IOHEXOL 350 MG/ML SOLN  COMPARISON:  Chest radiograph, 09/19/2013. Chest CT, 03/07/2011.  FINDINGS: There is no evidence of a pulmonary embolus.  The heart and great vessels are unremarkable. No neck base or axillary masses or adenopathy. No mediastinal or hilar masses or adenopathy.  There is dependent lower lobe subsegmental atelectasis. The lungs are otherwise clear. No pneumothorax or pleural effusion.  Limited evaluation of the upper abdomen shows a small low-density lesion in the left lobe of the liver, stable consistent with a cyst. No other abnormalities.  Minimal degenerative changes are noted along the mid thoracic spine. No other bony abnormality.  Review of the MIP images confirms the above findings.  IMPRESSION: 1. No evidence of a pulmonary embolism. 2. No acute findings. 3. Mild dependent lower lobe subsegmental atelectasis. The lungs are otherwise clear.   Electronically Signed   By: Amie Portland M.D.   On: 09/19/2013 10:49   Ct Abdomen Pelvis W Contrast  09/18/2013   CLINICAL DATA:  Diffuse abdominal pain. Emesis. Diarrhea.  EXAM: CT ABDOMEN AND PELVIS WITH CONTRAST  TECHNIQUE: Multidetector CT imaging of the abdomen and pelvis was performed using the standard protocol following bolus administration of intravenous contrast.  CONTRAST:  OMNIPAQUE IOHEXOL 300 MG/ML  SOLN  COMPARISON:  07/13/2013  FINDINGS: Stable small hypodense lesion in segment 3 of the liver.  Spleen, adrenal glands, and pancreas normal.  Mild extrahepatic biliary dilatation with CBD at 7 mm. Focal hyperdensity in the fundus of the gallbladder observed with a small umbilication along the luminal margin (image 26 of series 5) and a small amount of fluid density anterior to the lesion.  Similar appearance back through 02/11/2008 suggests that this may represent a Phrygian cap with some heaping of mucosa, although a  small gallbladder polyp is not excluded. Reviewing the prior ultrasound from 03/10/2008, I do not see an obvious abnormality in this vicinity.  Kidneys and proximal ureters unremarkable. No pathologic upper abdominal adenopathy is observed.  Scattered colonic diverticula noted. Appendix unremarkable. Mild wall thickening in the colon is somewhat diffuse.  Small umbilical hernia contains adipose tissue. No pathologic pelvic adenopathy is observed. Fluid density lesion along the inferior margin of the left ovary measures 2.4 x 2.0 x 1.8 cm, new from prior.  IMPRESSION: 1. Diffuse colon wall thickening favoring colitis. Adipose deposition in the proximal colonic wall, can be incidental but can also indicates long-standing process or inflammatory bowel disease. 2. Scattered colonic diverticula. 3. Mild extrahepatic biliary dilatation. 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 4. Benign appearing hypodense lesion in the lateral segment left hepatic lobe. This appears stable.   Electronically Signed   By: Herbie Baltimore M.D.   On: 09/18/2013 12:40   Dg Chest Port 1 View  09/19/2013   CLINICAL DATA:  Status post respiratory arrest  EXAM: PORTABLE CHEST - 1 VIEW  COMPARISON:  07/13/2013  FINDINGS: The heart size and mediastinal contours are within normal limits. Lungs are hyperexpanded but clear. No pleural effusion or pneumothorax. The visualized skeletal structures are unremarkable.  IMPRESSION: No active disease.   Electronically Signed   By: Amie Portland M.D.   On: 09/19/2013 08:08    Micro Results      Recent Results (from the past 240 hour(s))  MRSA PCR SCREENING     Status: None   Collection Time    09/18/13  7:30 PM      Result Value Range Status   MRSA by PCR NEGATIVE  NEGATIVE Final   Comment:            The GeneXpert MRSA Assay (FDA     approved for NASAL specimens     only), is one component of a      comprehensive MRSA colonization     surveillance program. It is not     intended to diagnose MRSA     infection nor to guide or     monitor treatment for     MRSA infections.     History of present illness and  Hospital Course:     Kindly see H&P for history of present illness and admission details, please review complete Labs, Consult reports and Test reports for all details in brief [name] [name], is a 52 y.o. female, patient with history of  diverticulosis, irritable bowel syndrome, anxiety, diabetes mellitus type 2, hypertension, who was admitted to the hospital after she started experiencing abdominal pain and cramping associated with nausea vomiting and diarrhea. Her CT scan was suggestive of colitis. She was admitted to the hospital was provided with bowel rest, pain control, IV antibiotics empiric along with IV fluids.     Patient on the day of her admission had an episode of vasovagal syncope without losing her pulse, however a CODE BLUE was called and she was given 3 compressions, probably came responsive. She was subsequently transferred to ICU for 2 days where she underwent CT of the chest which was unremarkable and her rhythm stayed stable. Her EKG and troponins were unremarkable also. 1 time outpatient cardiology followup recommended post discharge.  Patient with above dictated supportive care for colitis she is much better, her pain is almost completely gone, diarrhea is almost completely resolved, she has mild intermittent nausea,  Her abdominal exam is benign. She has no chest pain palpitations dizziness syncope etc. She is eager to go home. She will be now discharged home on oral Cipro Flagyl for another one week along with when necessary Zofran, I have suggested her to follow outpatient with GI for definitive workup on and colitis.    For diabetes mellitus, hypertension and anxiety she will continue her home medications unchanged. Will request primary care physician to  monitor A1c and glycemic control as appropriate.   Lab Results  Component Value Date   HGBA1C 8.1* 09/18/2013   CBG (last 3)   Recent Labs  09/20/13 0006 09/20/13 0451 09/20/13 0808  GLUCAP 88 85 132*      Today   Subjective:   [name] [name] today has no headache,no chest abdominal pain,no new weakness tingling or numbness, feels much better wants to go home today.    Objective:   Blood pressure 110/64, pulse 77, temperature 98.5 F (36.9 C), temperature source Oral, resp. rate 18, height 4' 11.5" (1.511 m), weight 66.679 kg (147 lb), last menstrual period 08/11/2012, SpO2 98.00%.   Intake/Output Summary (Last 24 hours) at 09/20/13 0837 Last data filed at 09/20/13 0700  Gross per 24 hour  Intake    940 ml  Output    375 ml  Net    565 ml    Exam Awake Alert, Oriented *3, No new F.N deficits, Normal affect [place].AT,PERRAL Supple Neck,No JVD, No cervical lymphadenopathy appriciated.  Symmetrical Chest wall movement, Good air movement bilaterally, CTAB RRR,No Gallops,Rubs or new Murmurs, No Parasternal Heave +ve B.Sounds, Abd Soft, Non tender, No organomegaly appriciated, No rebound -guarding or rigidity. No Cyanosis, Clubbing or edema, No new Rash or bruise  Data Review   CBC w Diff: Lab Results  Component Value Date   WBC 12.4* 09/20/2013   HGB 13.1 09/20/2013   HCT 37.8 09/20/2013   PLT 274 09/20/2013   LYMPHOPCT 28 09/20/2013   MONOPCT 7 09/20/2013   EOSPCT 1 09/20/2013   BASOPCT 0 09/20/2013    CMP: Lab Results  Component Value Date   NA 136 09/20/2013   K 3.6 09/20/2013   CL 102 09/20/2013   CO2 23 09/20/2013   BUN 8 09/20/2013   CREATININE 0.69 09/20/2013   PROT 7.0 09/19/2013   ALBUMIN 3.2* 09/19/2013   BILITOT 0.3 09/19/2013   ALKPHOS 72 09/19/2013   AST 11 09/19/2013   ALT 9 09/19/2013  .   Total Time in preparing paper work, data evaluation and todays exam - 35 minutes  Leroy Sea M.D on 09/20/2013 at 8:37 AM  [facility] Office  [phone]

## 2013-10-06 ENCOUNTER — Other Ambulatory Visit (INDEPENDENT_AMBULATORY_CARE_PROVIDER_SITE_OTHER): Payer: 59

## 2013-10-06 DIAGNOSIS — E559 Vitamin D deficiency, unspecified: Secondary | ICD-10-CM

## 2013-10-06 DIAGNOSIS — E785 Hyperlipidemia, unspecified: Secondary | ICD-10-CM

## 2013-10-06 DIAGNOSIS — E119 Type 2 diabetes mellitus without complications: Secondary | ICD-10-CM

## 2013-10-06 DIAGNOSIS — E039 Hypothyroidism, unspecified: Secondary | ICD-10-CM

## 2013-10-06 LAB — COMPREHENSIVE METABOLIC PANEL
ALT: 10 U/L (ref 0–35)
Albumin: 3.6 g/dL (ref 3.5–5.2)
Alkaline Phosphatase: 59 U/L (ref 39–117)
BUN: 7 mg/dL (ref 6–23)
CO2: 25 mEq/L (ref 19–32)
Calcium: 9 mg/dL (ref 8.4–10.5)
Chloride: 102 mEq/L (ref 96–112)
Glucose, Bld: 185 mg/dL — ABNORMAL HIGH (ref 70–99)
Potassium: 4 mEq/L (ref 3.5–5.1)
Total Protein: 7 g/dL (ref 6.0–8.3)

## 2013-10-06 LAB — TSH: TSH: 11.19 u[IU]/mL — ABNORMAL HIGH (ref 0.35–5.50)

## 2013-10-06 LAB — LIPID PANEL
Cholesterol: 219 mg/dL — ABNORMAL HIGH (ref 0–200)
Triglycerides: 185 mg/dL — ABNORMAL HIGH (ref 0.0–149.0)

## 2013-10-06 LAB — LDL CHOLESTEROL, DIRECT: Direct LDL: 169.1 mg/dL

## 2013-10-13 ENCOUNTER — Ambulatory Visit (INDEPENDENT_AMBULATORY_CARE_PROVIDER_SITE_OTHER): Payer: 59 | Admitting: Gastroenterology

## 2013-10-13 ENCOUNTER — Other Ambulatory Visit (INDEPENDENT_AMBULATORY_CARE_PROVIDER_SITE_OTHER): Payer: 59

## 2013-10-13 ENCOUNTER — Ambulatory Visit (INDEPENDENT_AMBULATORY_CARE_PROVIDER_SITE_OTHER): Payer: 59 | Admitting: Family Medicine

## 2013-10-13 ENCOUNTER — Telehealth: Payer: Self-pay

## 2013-10-13 ENCOUNTER — Encounter: Payer: Self-pay | Admitting: Family Medicine

## 2013-10-13 ENCOUNTER — Encounter: Payer: Self-pay | Admitting: Gastroenterology

## 2013-10-13 VITALS — BP 100/66 | HR 86 | Temp 98.1°F | Ht 61.5 in | Wt 148.5 lb

## 2013-10-13 VITALS — BP 104/76 | HR 83 | Ht 59.5 in | Wt 149.2 lb

## 2013-10-13 DIAGNOSIS — E039 Hypothyroidism, unspecified: Secondary | ICD-10-CM

## 2013-10-13 DIAGNOSIS — Z8719 Personal history of other diseases of the digestive system: Secondary | ICD-10-CM

## 2013-10-13 DIAGNOSIS — F17200 Nicotine dependence, unspecified, uncomplicated: Secondary | ICD-10-CM

## 2013-10-13 DIAGNOSIS — E785 Hyperlipidemia, unspecified: Secondary | ICD-10-CM

## 2013-10-13 DIAGNOSIS — I10 Essential (primary) hypertension: Secondary | ICD-10-CM

## 2013-10-13 DIAGNOSIS — R109 Unspecified abdominal pain: Secondary | ICD-10-CM

## 2013-10-13 DIAGNOSIS — E119 Type 2 diabetes mellitus without complications: Secondary | ICD-10-CM

## 2013-10-13 DIAGNOSIS — Z23 Encounter for immunization: Secondary | ICD-10-CM

## 2013-10-13 LAB — HEPATIC FUNCTION PANEL
ALT: 14 U/L (ref 0–35)
AST: 13 U/L (ref 0–37)
Albumin: 3.7 g/dL (ref 3.5–5.2)
Alkaline Phosphatase: 69 U/L (ref 39–117)
Bilirubin, Direct: 0 mg/dL (ref 0.0–0.3)
Total Bilirubin: 0.2 mg/dL — ABNORMAL LOW (ref 0.3–1.2)

## 2013-10-13 LAB — VITAMIN B12: Vitamin B-12: 346 pg/mL (ref 211–911)

## 2013-10-13 LAB — BASIC METABOLIC PANEL
BUN: 9 mg/dL (ref 6–23)
Calcium: 8.7 mg/dL (ref 8.4–10.5)
GFR: 97.23 mL/min (ref >60.00)
Potassium: 3.9 mEq/L (ref 3.5–5.1)

## 2013-10-13 LAB — CBC WITH DIFFERENTIAL/PLATELET
Basophils Relative: 0.6 % (ref 0.0–3.0)
Eosinophils Absolute: 0.3 10*3/uL (ref 0.0–0.7)
Hemoglobin: 14 g/dL (ref 12.0–15.0)
Lymphocytes Relative: 25 % (ref 12.0–46.0)
Lymphs Abs: 3.2 10*3/uL (ref 0.7–4.0)
MCHC: 33.8 g/dL (ref 30.0–36.0)
Monocytes Absolute: 0.7 10*3/uL (ref 0.1–1.0)
Monocytes Relative: 5.1 % (ref 3.0–12.0)
Platelets: 334 10*3/uL (ref 150.0–400.0)
RDW: 17.3 % — ABNORMAL HIGH (ref 11.5–14.6)
WBC: 12.9 10*3/uL — ABNORMAL HIGH (ref 4.5–10.5)

## 2013-10-13 LAB — FERRITIN: Ferritin: 16.6 ng/mL (ref 10.0–291.0)

## 2013-10-13 LAB — C-REACTIVE PROTEIN: CRP: 1.1 mg/dL (ref 0.5–20.0)

## 2013-10-13 LAB — LIPASE: Lipase: 46 U/L (ref 11.0–59.0)

## 2013-10-13 LAB — TSH: TSH: 23.39 u[IU]/mL — ABNORMAL HIGH (ref 0.35–5.50)

## 2013-10-13 LAB — IBC PANEL: Transferrin: 282.6 mg/dL (ref 212.0–360.0)

## 2013-10-13 LAB — FOLATE: Folate: 9.3 ng/mL (ref >5.9)

## 2013-10-13 MED ORDER — "INSULIN SYRINGE-NEEDLE U-100 29G X 1/2"" 0.3 ML MISC": Status: DC

## 2013-10-13 MED ORDER — TRAMADOL HCL 50 MG PO TABS: 50.0000 mg | ORAL_TABLET | Freq: Four times a day (QID) | ORAL | Status: DC | PRN

## 2013-10-13 MED ORDER — INSULIN GLARGINE 100 UNIT/ML ~~LOC~~ SOLN: 30.0000 [IU] | Freq: Every day | SUBCUTANEOUS | Status: DC

## 2013-10-13 NOTE — Telephone Encounter (Signed)
Pt was seen today and pt said insulin was changed to vial and pt needs order for syringes sent to [facility] [address]

## 2013-10-13 NOTE — Assessment & Plan Note (Signed)
Poor control... Work on lifestyle changes... Amy need to add additional med to crestor if not improving at next OV.

## 2013-10-13 NOTE — Assessment & Plan Note (Signed)
Increase lantus to 30 units

## 2013-10-13 NOTE — Patient Instructions (Addendum)
Increase lantus to  25 then 30 Units daily. Work on exercise, weight loss, healthy eating habits. Follow up in 3 months with fasting labs prior.

## 2013-10-13 NOTE — Progress Notes (Signed)
This is a very unusual 52 year old patient who recently had a syncopal episode of  ??? brief cardiac arrest from lower abdominal pain requiring hospitalization at [facility]. The details of her hospitalization and treatment are vague, and I cannot determine exactly what happened during hospitalization, but does not appear that she had an MI or definite pulmonary embolization. She is followed by Dr. Kerby Nora in general medical care mostly for her insulin-dependent diabetes. She's had lower abdominal pain with intermittent rectal bleeding for many years, had a negative colonoscopy 5 years ago in similar circumstances. She's had a series of recent CT scans which have suggested colitis, and apparently during her recent hospitalization she was treated with multiple antibiotics. She continues to complain of suprapubic abdominal pain with occasional rectal bleeding. Very unusual and vague history. Review of her chart 5 years ago which is having the same type of complaint showed a normal colonoscopy at that time. By Dr. Christella Hartigan.  Current Medications, Allergies, Past Medical History, Past Surgical History, Family History and Social History were reviewed in [employer] record.  ROS: All systems were reviewed and are negative unless otherwise stated in the HPI.          Physical Exam: Healthy-appearing patient in no acute distress. Blood pressure 104/76, pulse 83 and regular and weight 149 with a BMI of 29.64. Her chest is generally clear, she appears to be in a regular rhythm without murmurs gallops or rubs. Her abdomen shows some tenderness the left lower quadrant area but otherwise abdominal exam is unremarkable. Inspection the rectum is unremarkable as is rectal exam. There is hard firm stool in the rectal vault which is guaiac negative.    Assessment and Plan: Very very unusual case of a patient with a chronic abdominal pain diagnosed as severe IBS previously with a  negative colonoscopy during one of these episodes.. She had recent ??? vagal syncopal episode from abdominal pain, and as mentioned above is an insulin-dependent diabetic. There is no evidence otherwise of peripheral vascular disease or coronary artery disease to suggest ischemic colitis. Her stool today is not of diarrhea, there is no blood, but she continues with some left lower quadrant discomfort of unexplained etiology. As far as I'm concerned, she is not a candidate for colonoscopy at this point after recent cardiovascular event. I will send this letter back to primary care, but I think she should probably have cardiac clearance evaluation before any endoscopic procedure undertaken. She seems to have severe IBS for many years with out evidence of inflammatory bowel disease. I've ordered a followup CT scan of the abdomen per her continued left lower quadrant pain and tenderness, and we'll check laboratory parameters including CBC, amylase, lipase, liver profile CRP and sedimentation rate. I placed her on when necessary tramadol 50 mg every 6-8 hours, will see her back in one month's time. She may need referral to a tertiary Medical Center.   CC Dr Kerby Nora   and Dr. Olga Millers

## 2013-10-13 NOTE — Telephone Encounter (Signed)
[name] [name] notified prescription for insulin syringes has been sent to her pharmacy.

## 2013-10-13 NOTE — Telephone Encounter (Signed)
Please contact pharmacy.[facility] to given rx for needles of gauge they recommend. For 90 day supply with 3 refills.

## 2013-10-13 NOTE — Assessment & Plan Note (Signed)
Well controlled. Continue current medication.  

## 2013-10-13 NOTE — Progress Notes (Signed)
Subjective:    Patient ID: [name] [name], female    DOB: 06-23-1961, 52 y.o.   MRN: [id number]  HPI   Recent hospital admisison in 09/2013 Admitted to hospitalist service with diagnosis of nonspecific colitis shown on CT with WBC 19.8. Pt became unresponsive around 1820 on 10/10 and code blue was called for respiratory arrest but never lost pulse (was given fentanyl several hours earlier) Pt became responsive after 3 chest compressions and was transferred to the ICU for PCCM management. Thought to be vasovagal syncope from pain and nausea.  She was treated with antibiotics with improvement of symptoms. She still has small amount of blood in stool. No changes in DM and home regimen. Has appt with Dr. Jarold Motto today  GI to follow up.  Diabetes:  On lantus 10 Units daily. Have had some improvement but not enough. Lab Results  Component Value Date   HGBA1C 8.2* 10/06/2013  Using medications without difficulties:None... Wishes to change to other insulin to be able to give insulin in different locations than abdomen. Hypoglycemic episodes: None Hyperglycemic episodes:None Feet problems: None Blood Sugars averaging:  190s fasting eye exam within last year:yes  Elevated Cholesterol: Poor control   On crestor daily. Lab Results  Component Value Date   CHOL 219* 10/06/2013   HDL 37.90* 10/06/2013   LDLCALC 125* 11/27/2011   LDLDIRECT 169.1 10/06/2013   TRIG 185.0* 10/06/2013   CHOLHDL 6 10/06/2013  Using medications without problems: None Muscle aches: None Diet compliance: Has been working on low fat diet, avoiuds fried foods. Exercise:walking off and on. Other complaints:   Hypertension:   BP at goal on current regimen.   BP Readings from Last 3 Encounters:  10/13/13 100/66  09/20/13 110/64  08/28/13 104/68   Using medication without problems or lightheadedness: None Chest pain with exertion:None Edema:None Short of breath:None Average home BPs: at goal < 130/80 Other  issues:     Review of Systems  Constitutional: Negative for fever and fatigue.  HENT: Negative for ear pain.   Eyes: Negative for pain.  Respiratory: Negative for chest tightness and shortness of breath.   Cardiovascular: Negative for chest pain, palpitations and leg swelling.  Gastrointestinal: Positive for abdominal pain.  Genitourinary: Negative for dysuria.       Objective:   Physical Exam  Constitutional: Vital signs are normal. She appears well-developed and well-nourished. She is cooperative.  Non-toxic appearance. She does not appear ill. No distress.  HENT:  Head: Normocephalic.  Right Ear: Hearing, tympanic membrane, external ear and ear canal normal. Tympanic membrane is not erythematous, not retracted and not bulging.  Left Ear: Hearing, tympanic membrane, external ear and ear canal normal. Tympanic membrane is not erythematous, not retracted and not bulging.  Nose: No mucosal edema or rhinorrhea. Right sinus exhibits no maxillary sinus tenderness and no frontal sinus tenderness. Left sinus exhibits no maxillary sinus tenderness and no frontal sinus tenderness.  Mouth/Throat: Uvula is midline, oropharynx is clear and moist and mucous membranes are normal.  Eyes: Conjunctivae, EOM and lids are normal. Pupils are equal, round, and reactive to light. Lids are everted and swept, no foreign bodies found.  Neck: Trachea normal and normal range of motion. Neck supple. Carotid bruit is not present. No mass and no thyromegaly present.  Cardiovascular: Normal rate, regular rhythm, S1 normal, S2 normal, normal heart sounds, intact distal pulses and normal pulses.  Exam reveals no gallop and no friction rub.   No murmur heard. Pulmonary/Chest: Effort normal and breath  sounds normal. Not tachypneic. No respiratory distress. She has no decreased breath sounds. She has no wheezes. She has no rhonchi. She has no rales.  Abdominal: Soft. Normal appearance and bowel sounds are normal. There  is no hepatosplenomegaly. There is tenderness in the right lower quadrant and left lower quadrant. There is no CVA tenderness.  Neurological: She is alert.  Skin: Skin is warm, dry and intact. No rash noted.  Psychiatric: Her speech is normal and behavior is normal. Judgment and thought content normal. Her mood appears not anxious. Cognition and memory are normal. She does not exhibit a depressed mood.    Diabetic foot exam: Normal inspection No skin breakdown No calluses  Normal DP pulses Normal sensation to light touch and monofilament Nails normal       Assessment & Plan:

## 2013-10-13 NOTE — Assessment & Plan Note (Signed)
Due for re-eval. 

## 2013-10-13 NOTE — Patient Instructions (Signed)
Please follow up with Dr Jarold Motto in one month  Your physician has requested that you go to the basement for the following lab work before leaving today: Anemia Panel Amylase Lipase Sedimentation Rate Liver Function Panel CRP CBC TSH BMP  We have sent the following medications to your pharmacy for you to pick up at your convenience: Tramadol 50 mg, please take one tablet by mouth every six to eight hours as needed for pain   ___________________________________________________________________________________________________________________________________________________________________________________________________ You have been scheduled for a CT scan of the abdomen and pelvis at [facility] ([address]---this is in the same building as Architectural technologist).   You are scheduled on 10-16-2013 at 1 pm. You should arrive 15 minutes prior to your appointment time for registration. Please follow the written instructions below on the day of your exam:  WARNING: IF YOU ARE ALLERGIC TO IODINE/X-RAY DYE, PLEASE NOTIFY RADIOLOGY IMMEDIATELY AT [phone]! YOU WILL BE GIVEN A 13 HOUR PREMEDICATION PREP.  1) Do not eat or drink anything after 9 am (4 hours prior to your test) 2) You have been given 2 bottles of oral contrast to drink. The solution may taste better if refrigerated, but do NOT add ice or any other liquid to this solution. Shake well before drinking.    Drink 1 bottle of contrast @ 11 am (2 hours prior to your exam)  Drink 1 bottle of contrast @ 12 pm (1 hour prior to your exam)  You may take any medications as prescribed with a small amount of water except for the following: Metformin, Glucophage, Glucovance, Avandamet, Riomet, Fortamet, Actoplus Met, Janumet, Glumetza or Metaglip. The above medications must be held the day of the exam AND 48 hours after the exam.  The purpose of you drinking the oral contrast is to aid in the visualization of your intestinal  tract. The contrast solution may cause some diarrhea. Before your exam is started, you will be given a small amount of fluid to drink. Depending on your individual set of symptoms, you may also receive an intravenous injection of x-ray contrast/dye. Plan on being at [facility] for 30 minutes or long, depending on the type of exam you are having performed.  This test typically takes 30-45 minutes to complete.  If you have any questions regarding your exam or if you need to reschedule, you may call the CT department at [phone] between the hours of 8:00 am and 5:00 pm, Monday-Friday.  ________________________________________________________________________________________________________________________________________________________________________________

## 2013-10-13 NOTE — Progress Notes (Signed)
   Recent hospital admisison in 09/2013 Admitted to hospitalist service with diagnosis of nonspecific colitis shown on CT with WBC 19.8. Pt became unresponsive around 1820 on 10/10 and code blue was called for respiratory arrest but never lost pulse (was given fentanyl several hours earlier) Pt became responsive after 3 chest compressions and was transferred to the ICU for PCCM management. Thought to be vasovagal syncope from pain and nausea.  She was treated with antibiotics with improvement of symptoms. She still has small amount of blood in stool. No changes in DM and home regimen. Has appt with Dr. Jarold Motto today  GI to follow up.  Diabetes:  On lantus 10 Units daily. Have had some improvement but not enough. Lab Results  Component Value Date   HGBA1C 8.2* 10/06/2013  Using medications without difficulties:None... Wishes to change to other insulin to be able to give insulin in different locations than abdomen. Hypoglycemic episodes: None Hyperglycemic episodes:None Feet problems: None Blood Sugars averaging:  190s fasting eye exam within last year:yes  Elevated Cholesterol: Poor control   On crestor daily. Lab Results  Component Value Date   CHOL 219* 10/06/2013   HDL 37.90* 10/06/2013   LDLCALC 125* 11/27/2011   LDLDIRECT 169.1 10/06/2013   TRIG 185.0* 10/06/2013   CHOLHDL 6 10/06/2013  Using medications without problems: None Muscle aches: None Diet compliance: Has been working on low fat diet, avoiuds fried foods. Exercise:walking off and on. Other complaints:   Hypertension:   BP at goal on current regimen.   BP Readings from Last 3 Encounters:  10/13/13 100/66  09/20/13 110/64  08/28/13 104/68   Using medication without problems or lightheadedness: None Chest pain with exertion:None Edema:None Short of breath:None Average home BPs: at goal < 130/80 Other issues:

## 2013-10-14 ENCOUNTER — Telehealth: Payer: Self-pay | Admitting: Family Medicine

## 2013-10-14 DIAGNOSIS — I498 Other specified cardiac arrhythmias: Secondary | ICD-10-CM

## 2013-10-14 DIAGNOSIS — R092 Respiratory arrest: Secondary | ICD-10-CM

## 2013-10-14 NOTE — Telephone Encounter (Signed)
Pt agreeable to cardiac referral.

## 2013-10-14 NOTE — Addendum Note (Signed)
Addended by: Kerby Nora E on: 10/14/2013 11:59 PM   Modules accepted: Orders

## 2013-10-14 NOTE — Telephone Encounter (Signed)
Notify pt that Gi would like her to have cardiac clearance prior to any GI workup such as colonoscopy given her respiratory arrest in the hospital. Let me know if she is agreeable to cardiac referral.

## 2013-10-15 NOTE — Telephone Encounter (Signed)
Referral sent 

## 2013-10-16 ENCOUNTER — Ambulatory Visit (INDEPENDENT_AMBULATORY_CARE_PROVIDER_SITE_OTHER)
Admission: RE | Admit: 2013-10-16 | Discharge: 2013-10-16 | Disposition: A | Discharge status: Home / Self Care | Payer: 59 | Source: Ambulatory Visit | Attending: Gastroenterology | Admitting: Gastroenterology

## 2013-10-16 ENCOUNTER — Telehealth: Payer: Self-pay | Admitting: Family Medicine

## 2013-10-16 DIAGNOSIS — R109 Unspecified abdominal pain: Secondary | ICD-10-CM

## 2013-10-16 MED ORDER — IOHEXOL 300 MG/ML  SOLN
100.0000 mL | Freq: Once | INTRAMUSCULAR | Status: AC | PRN
  Administered 2013-10-16: 100 mL via INTRAVENOUS

## 2013-10-16 NOTE — Telephone Encounter (Signed)
Notify [pt that labs at GI showed high TSH suggesting inadequate thyroid treatment on? 225 mcg daily ( is that the dose she is taking?) We need her to come back in for free t3 and free t4 now ( other labs do not need to be done yet)... At that time free t3 and free t4 will be checked and we will address thyroid elevation.

## 2013-10-16 NOTE — Telephone Encounter (Signed)
See phone note

## 2013-10-19 NOTE — Telephone Encounter (Signed)
Left message for patient to return my call.

## 2013-10-19 NOTE — Telephone Encounter (Signed)
Spoke with [name] [name].  She confirmed that she is taking Levothyroxine 225 mcg daily.  Lab appointment scheduled for 10/20/2013 @ 9:45am for FT3 & FT4 per Dr. Ermalene Searing.

## 2013-10-20 ENCOUNTER — Other Ambulatory Visit: Payer: 59

## 2013-10-20 NOTE — Telephone Encounter (Signed)
Noted  

## 2013-10-21 ENCOUNTER — Ambulatory Visit: Payer: 59 | Admitting: Physician Assistant

## 2013-10-23 ENCOUNTER — Encounter: Payer: Self-pay | Admitting: Physician Assistant

## 2013-10-28 ENCOUNTER — Inpatient Hospital Stay (HOSPITAL_COMMUNITY)
Admission: EM | Admit: 2013-10-28 | Discharge: 2013-10-29 | DRG: 392 | Disposition: A | Discharge status: Home / Self Care | Payer: 59 | Attending: Internal Medicine | Admitting: Internal Medicine

## 2013-10-28 ENCOUNTER — Emergency Department (HOSPITAL_COMMUNITY): Payer: 59

## 2013-10-28 DIAGNOSIS — K219 Gastro-esophageal reflux disease without esophagitis: Secondary | ICD-10-CM | POA: Diagnosis present

## 2013-10-28 DIAGNOSIS — R109 Unspecified abdominal pain: Secondary | ICD-10-CM | POA: Diagnosis present

## 2013-10-28 DIAGNOSIS — R809 Proteinuria, unspecified: Secondary | ICD-10-CM | POA: Diagnosis present

## 2013-10-28 DIAGNOSIS — E1165 Type 2 diabetes mellitus with hyperglycemia: Secondary | ICD-10-CM | POA: Diagnosis present

## 2013-10-28 DIAGNOSIS — E1149 Type 2 diabetes mellitus with other diabetic neurological complication: Secondary | ICD-10-CM | POA: Diagnosis present

## 2013-10-28 DIAGNOSIS — R0789 Other chest pain: Secondary | ICD-10-CM | POA: Diagnosis present

## 2013-10-28 DIAGNOSIS — K589 Irritable bowel syndrome without diarrhea: Secondary | ICD-10-CM | POA: Diagnosis present

## 2013-10-28 DIAGNOSIS — N182 Chronic kidney disease, stage 2 (mild): Secondary | ICD-10-CM | POA: Diagnosis present

## 2013-10-28 DIAGNOSIS — J40 Bronchitis, not specified as acute or chronic: Secondary | ICD-10-CM

## 2013-10-28 DIAGNOSIS — M810 Age-related osteoporosis without current pathological fracture: Secondary | ICD-10-CM | POA: Diagnosis present

## 2013-10-28 DIAGNOSIS — K5289 Other specified noninfective gastroenteritis and colitis: Principal | ICD-10-CM

## 2013-10-28 DIAGNOSIS — K529 Noninfective gastroenteritis and colitis, unspecified: Secondary | ICD-10-CM

## 2013-10-28 DIAGNOSIS — I129 Hypertensive chronic kidney disease with stage 1 through stage 4 chronic kidney disease, or unspecified chronic kidney disease: Secondary | ICD-10-CM | POA: Diagnosis present

## 2013-10-28 DIAGNOSIS — E1129 Type 2 diabetes mellitus with other diabetic kidney complication: Secondary | ICD-10-CM | POA: Diagnosis present

## 2013-10-28 DIAGNOSIS — R112 Nausea with vomiting, unspecified: Secondary | ICD-10-CM

## 2013-10-28 DIAGNOSIS — E039 Hypothyroidism, unspecified: Secondary | ICD-10-CM | POA: Diagnosis present

## 2013-10-28 DIAGNOSIS — Z87891 Personal history of nicotine dependence: Secondary | ICD-10-CM

## 2013-10-28 DIAGNOSIS — G8929 Other chronic pain: Secondary | ICD-10-CM | POA: Diagnosis present

## 2013-10-28 DIAGNOSIS — I10 Essential (primary) hypertension: Secondary | ICD-10-CM | POA: Diagnosis present

## 2013-10-28 DIAGNOSIS — Z90710 Acquired absence of both cervix and uterus: Secondary | ICD-10-CM

## 2013-10-28 DIAGNOSIS — R110 Nausea: Secondary | ICD-10-CM | POA: Diagnosis present

## 2013-10-28 DIAGNOSIS — D72829 Elevated white blood cell count, unspecified: Secondary | ICD-10-CM | POA: Diagnosis present

## 2013-10-28 DIAGNOSIS — J329 Chronic sinusitis, unspecified: Secondary | ICD-10-CM

## 2013-10-28 DIAGNOSIS — E1142 Type 2 diabetes mellitus with diabetic polyneuropathy: Secondary | ICD-10-CM | POA: Diagnosis present

## 2013-10-28 DIAGNOSIS — R079 Chest pain, unspecified: Secondary | ICD-10-CM

## 2013-10-28 DIAGNOSIS — E785 Hyperlipidemia, unspecified: Secondary | ICD-10-CM | POA: Diagnosis present

## 2013-10-28 DIAGNOSIS — R12 Heartburn: Secondary | ICD-10-CM | POA: Diagnosis present

## 2013-10-28 DIAGNOSIS — E860 Dehydration: Secondary | ICD-10-CM | POA: Diagnosis present

## 2013-10-28 DIAGNOSIS — Z794 Long term (current) use of insulin: Secondary | ICD-10-CM

## 2013-10-28 DIAGNOSIS — Z79899 Other long term (current) drug therapy: Secondary | ICD-10-CM

## 2013-10-28 DIAGNOSIS — Z885 Allergy status to narcotic agent status: Secondary | ICD-10-CM

## 2013-10-28 DIAGNOSIS — Z833 Family history of diabetes mellitus: Secondary | ICD-10-CM

## 2013-10-28 HISTORY — DX: Chronic kidney disease, stage 2 (mild): N18.2

## 2013-10-28 LAB — URINE MICROSCOPIC-ADD ON

## 2013-10-28 LAB — CBC WITH DIFFERENTIAL/PLATELET
Basophils Absolute: 0 10*3/uL (ref 0.0–0.1)
Basophils Relative: 0 % (ref 0–1)
Eosinophils Absolute: 0 10*3/uL (ref 0.0–0.7)
HCT: 45.3 % (ref 36.0–46.0)
MCH: 27.8 pg (ref 26.0–34.0)
MCHC: 34.4 g/dL (ref 30.0–36.0)
Neutro Abs: 15.2 10*3/uL — ABNORMAL HIGH (ref 1.7–7.7)
Neutrophils Relative %: 89 % — ABNORMAL HIGH (ref 43–77)
Platelets: 282 10*3/uL (ref 150–400)
RDW: 16.7 % — ABNORMAL HIGH (ref 11.5–15.5)
WBC: 17 10*3/uL — ABNORMAL HIGH (ref 4.0–10.5)

## 2013-10-28 LAB — GLUCOSE, CAPILLARY: Glucose-Capillary: 188 mg/dL — ABNORMAL HIGH (ref 70–99)

## 2013-10-28 LAB — COMPREHENSIVE METABOLIC PANEL
ALT: 12 U/L (ref 0–35)
AST: 13 U/L (ref 0–37)
Albumin: 4.1 g/dL (ref 3.5–5.2)
Alkaline Phosphatase: 87 U/L (ref 39–117)
BUN: 14 mg/dL (ref 6–23)
Calcium: 9.8 mg/dL (ref 8.4–10.5)
Potassium: 3.7 mEq/L (ref 3.5–5.1)
Sodium: 138 mEq/L (ref 135–145)
Total Protein: 7.8 g/dL (ref 6.0–8.3)

## 2013-10-28 LAB — TROPONIN I: Troponin I: <0.3 ng/mL (ref <0.30)

## 2013-10-28 LAB — URINALYSIS, ROUTINE W REFLEX MICROSCOPIC
Glucose, UA: NEGATIVE mg/dL
Hgb urine dipstick: NEGATIVE
Ketones, ur: 40 mg/dL — AB
Leukocytes, UA: NEGATIVE
pH: 7.5 (ref 5.0–8.0)

## 2013-10-28 MED ORDER — ACETAMINOPHEN 325 MG PO TABS: 650.0000 mg | ORAL_TABLET | Freq: Four times a day (QID) | ORAL | Status: DC | PRN

## 2013-10-28 MED ORDER — PROMETHAZINE HCL 25 MG/ML IJ SOLN
12.5000 mg | Freq: Once | INTRAMUSCULAR | Status: AC
  Administered 2013-10-28: 12.5 mg via INTRAVENOUS
  Filled 2013-10-28: qty 1

## 2013-10-28 MED ORDER — INSULIN ASPART 100 UNIT/ML ~~LOC~~ SOLN: 0.0000 [IU] | Freq: Three times a day (TID) | SUBCUTANEOUS | Status: DC

## 2013-10-28 MED ORDER — PANTOPRAZOLE SODIUM 40 MG IV SOLR
40.0000 mg | INTRAVENOUS | Status: DC
  Administered 2013-10-29: 40 mg via INTRAVENOUS
  Filled 2013-10-28 (×3): qty 40

## 2013-10-28 MED ORDER — SODIUM CHLORIDE 0.9 % IV SOLN
1000.0000 mL | INTRAVENOUS | Status: DC
  Administered 2013-10-28: 1000 mL via INTRAVENOUS

## 2013-10-28 MED ORDER — LEVOTHYROXINE SODIUM 200 MCG PO TABS
200.0000 ug | ORAL_TABLET | Freq: Every day | ORAL | Status: DC
  Administered 2013-10-29: 200 ug via ORAL
  Filled 2013-10-28 (×2): qty 1

## 2013-10-28 MED ORDER — ENOXAPARIN SODIUM 40 MG/0.4ML ~~LOC~~ SOLN
40.0000 mg | SUBCUTANEOUS | Status: DC
  Administered 2013-10-29: 40 mg via SUBCUTANEOUS
  Filled 2013-10-28: qty 0.4

## 2013-10-28 MED ORDER — INSULIN GLARGINE 100 UNIT/ML ~~LOC~~ SOLN
15.0000 [IU] | Freq: Every day | SUBCUTANEOUS | Status: DC
  Administered 2013-10-29: 11:00:00 15 [IU] via SUBCUTANEOUS
  Filled 2013-10-28: qty 0.15

## 2013-10-28 MED ORDER — DM-GUAIFENESIN ER 30-600 MG PO TB12: 1.0000 | ORAL_TABLET | Freq: Two times a day (BID) | ORAL | Status: DC

## 2013-10-28 MED ORDER — ONDANSETRON HCL 4 MG/2ML IJ SOLN
4.0000 mg | Freq: Once | INTRAMUSCULAR | Status: AC
  Administered 2013-10-28: 4 mg via INTRAVENOUS
  Filled 2013-10-28: qty 2

## 2013-10-28 MED ORDER — SODIUM CHLORIDE 0.9 % IV SOLN
INTRAVENOUS | Status: DC
  Administered 2013-10-29: via INTRAVENOUS

## 2013-10-28 MED ORDER — ONDANSETRON HCL 4 MG PO TABS: 4.0000 mg | ORAL_TABLET | Freq: Four times a day (QID) | ORAL | Status: DC | PRN

## 2013-10-28 MED ORDER — SODIUM CHLORIDE 0.9 % IV SOLN
1000.0000 mL | Freq: Once | INTRAVENOUS | Status: AC
  Administered 2013-10-28: 1000 mL via INTRAVENOUS

## 2013-10-28 MED ORDER — SODIUM CHLORIDE 0.9 % IV BOLUS (SEPSIS)
1000.0000 mL | Freq: Once | INTRAVENOUS | Status: AC
  Administered 2013-10-28: 1000 mL via INTRAVENOUS

## 2013-10-28 MED ORDER — LORATADINE 10 MG PO TABS: 10.0000 mg | ORAL_TABLET | Freq: Every day | ORAL | Status: DC

## 2013-10-28 MED ORDER — ONDANSETRON 8 MG PO TBDP: 8.0000 mg | ORAL_TABLET | Freq: Three times a day (TID) | ORAL | Status: DC | PRN

## 2013-10-28 MED ORDER — ONDANSETRON 4 MG PO TBDP
4.0000 mg | ORAL_TABLET | Freq: Once | ORAL | Status: AC
  Administered 2013-10-28: 4 mg via ORAL
  Filled 2013-10-28: qty 1

## 2013-10-28 MED ORDER — LEVOTHYROXINE SODIUM 25 MCG PO TABS
25.0000 ug | ORAL_TABLET | Freq: Every day | ORAL | Status: DC
  Administered 2013-10-29: 25 ug via ORAL
  Filled 2013-10-28 (×2): qty 1

## 2013-10-28 MED ORDER — ACETAMINOPHEN 650 MG RE SUPP: 650.0000 mg | Freq: Four times a day (QID) | RECTAL | Status: DC | PRN

## 2013-10-28 MED ORDER — ONDANSETRON HCL 4 MG/2ML IJ SOLN
4.0000 mg | Freq: Four times a day (QID) | INTRAMUSCULAR | Status: DC | PRN
  Administered 2013-10-29: 01:00:00 4 mg via INTRAVENOUS
  Filled 2013-10-28: qty 2

## 2013-10-28 MED ORDER — ATORVASTATIN CALCIUM 40 MG PO TABS
40.0000 mg | ORAL_TABLET | Freq: Every day | ORAL | Status: DC
  Filled 2013-10-28: qty 1

## 2013-10-28 NOTE — ED Notes (Signed)
Bed: WA08 Expected date:  Expected time:  Means of arrival:  Comments: ems 52 yo F, n/v cough

## 2013-10-28 NOTE — ED Provider Notes (Addendum)
CSN: [id number]     Arrival date & time 10/28/13  1300 History   First MD Initiated Contact with Patient 10/28/13 1302     Chief Complaint  Patient presents with  . Nausea  . Emesis   (Consider location/radiation/quality/duration/timing/severity/associated sxs/prior Treatment) HPI Patient reports she was fine last night. She reports at 3 AM this morning she woke up with a mild frontal headache and states her hair and her clothing was wet from sweating. She has had nausea and vomited 3-4 times. She denies any diarrhea. She states her chest is sore and indicates anterior portion of her chest it hurts more when she coughs. She is coughing up some yellow mucus. She states she's also having sinus drainage and yellow rhinorrhea. She indicates her headache is in the frontal region and it feels worse when she coughs and when she lays flat. She denies any fever and states when he checked it at home it was 98.1. She reports her blood sugar at home was 141. She has not been around anybody else is ill. She states she did get a flu shot this fall.  PCP Dr Ermalene Searing  Past Medical History  Diagnosis Date  . Osteoporosis   . Intermittent vertigo   . B12 deficiency   . Peripheral neuropathy   . GERD (gastroesophageal reflux disease)   . Anxiety   . Hyperlipidemia   . Hypothyroidism   . IBS (irritable bowel syndrome)   . Hypertension   . Leg cramping     "@ night" (09/18/2013)  . Type II diabetes mellitus   . Umbilical hernia     unrepaired (09/18/2013)  . Migraine     "once q couple months" (09/18/2013)   Past Surgical History  Procedure Laterality Date  . Cesarean section  1989  . Laparoscopic assisted vaginal hysterectomy  09/23/2012    Procedure: LAPAROSCOPIC ASSISTED VAGINAL HYSTERECTOMY;  Surgeon: Mitchel Honour, DO;  Location: [facility];  Service: Gynecology;  Laterality: N/A;  pull Dr Vance Gather instrument  . Tubal ligation Bilateral 1992  . Vaginal hysterectomy  2013   Family History  Problem  Relation Age of Onset  . Heart failure Mother   . Diabetes Maternal Grandmother   . Alzheimer's disease Maternal Grandmother   . Coronary artery disease Maternal Grandmother   . Heart attack Maternal Grandfather    History  Substance Use Topics  . Smoking status: Former Smoker -- .2 years    Types: Cigarettes    Quit date: 12/10/1990  . Smokeless tobacco: Never Used  . Alcohol Use: No   Lives at home Lives with spouse Takes care of her SIL with MS   OB History   Grav Para Term Preterm Abortions TAB SAB Ect Mult Living                 Review of Systems  All other systems reviewed and are negative.    Allergies  Codeine  Home Medications   Current Outpatient Rx  Name  Route  Sig  Dispense  Refill  . dicyclomine (BENTYL) 20 MG tablet   Oral   Take 20 mg by mouth 2 (two) times daily.         . insulin glargine (LANTUS) 100 UNIT/ML injection   Subcutaneous   Inject 0.3 mLs (30 Units total) into the skin at bedtime.   10 mL   12   . Insulin Syringe-Needle U-100 29G X 1/2" 0.3 ML MISC      Use to inject Lantus  insulin daily at bedtime. Dx: 250.02   100 each   1   . levothyroxine (SYNTHROID, LEVOTHROID) 200 MCG tablet   Oral   Take 200 mcg by mouth daily before breakfast.         . levothyroxine (SYNTHROID, LEVOTHROID) 25 MCG tablet   Oral   Take 25 mcg by mouth daily before breakfast.         . omeprazole (PRILOSEC) 40 MG capsule   Oral   Take 40 mg by mouth daily.         . ondansetron (ZOFRAN) 4 MG tablet   Oral   Take 1 tablet (4 mg total) by mouth every 8 (eight) hours as needed for nausea.   20 tablet   0   . rosuvastatin (CRESTOR) 40 MG tablet   Oral   Take 20 mg by mouth daily after supper.          . sertraline (ZOLOFT) 50 MG tablet   Oral   Take 50 mg by mouth daily.         . traMADol (ULTRAM) 50 MG tablet   Oral   Take 1 tablet (50 mg total) by mouth every 6 (six) hours as needed.   65 tablet   1    BP 121/82   Pulse 78  Temp(Src) 97.8 F (36.6 C) (Oral)  Resp 20  SpO2 98%  LMP 08/11/2012  Vital signs normal   Physical Exam  Nursing note and vitals reviewed. Constitutional: She is oriented to person, place, and time. She appears well-developed and well-nourished.  Non-toxic appearance. She does not appear ill.  HENT:  Head: Normocephalic and atraumatic.  Right Ear: External ear normal.  Left Ear: External ear normal.  Nose: Nose normal. No mucosal edema or rhinorrhea.  Mouth/Throat: Mucous membranes are normal. No dental abscesses or uvula swelling.  Dry tongue  Eyes: Conjunctivae and EOM are normal. Pupils are equal, round, and reactive to light.  Neck: Normal range of motion and full passive range of motion without pain. Neck supple.  Cardiovascular: Normal rate, regular rhythm and normal heart sounds.  Exam reveals no gallop and no friction rub.   No murmur heard. Pulmonary/Chest: Effort normal and breath sounds normal. No respiratory distress. She has no wheezes. She has no rhonchi. She has no rales. She exhibits no tenderness and no crepitus.  Abdominal: Soft. Normal appearance and bowel sounds are normal. She exhibits no distension. There is no tenderness. There is no rebound and no guarding.  Musculoskeletal: Normal range of motion. She exhibits no edema and no tenderness.  Moves all extremities well.   Neurological: She is alert and oriented to person, place, and time. She has normal strength. No cranial nerve deficit.  Skin: Skin is warm, dry and intact. No rash noted. No erythema. No pallor.  Psychiatric: Her speech is normal and behavior is normal. Her mood appears not anxious.  Flat affect    ED Course  Procedures (including critical care time)  Medications  0.9 %  sodium chloride infusion (not administered)    Followed by  0.9 %  sodium chloride infusion (0 mLs Intravenous Stopped 10/28/13 1559)  ondansetron (ZOFRAN) injection 4 mg (4 mg Intravenous Given 10/28/13 1346)   ondansetron (ZOFRAN-ODT) disintegrating tablet 4 mg (4 mg Oral Given 10/28/13 1609)   15:15 Pt feeling better, wants to try oral fluids.   15:35 Drinking fluids and feel fine, ready to go home.   16:05 nursing staff states patient stood up  to go home and felt weak, dizzy and had chest pain. Will do more testing. Will turn over to Dr Wilkie Aye at change of shift.   Labs Review Results for orders placed during the hospital encounter of 10/28/13  GLUCOSE, CAPILLARY      Result Value Range   Glucose-Capillary 188 (*) 70 - 99 mg/dL   Comment 1 Notify RN    CBC WITH DIFFERENTIAL      Result Value Range   WBC 17.0 (*) 4.0 - 10.5 K/uL   RBC 5.62 (*) 3.87 - 5.11 MIL/uL   Hemoglobin 15.6 (*) 12.0 - 15.0 g/dL   HCT 40.9  81.1 - 91.4 %   MCV 80.6  78.0 - 100.0 fL   MCH 27.8  26.0 - 34.0 pg   MCHC 34.4  30.0 - 36.0 g/dL   RDW 78.2 (*) 95.6 - 21.3 %   Platelets 282  150 - 400 K/uL   Neutrophils Relative % 89 (*) 43 - 77 %   Neutro Abs 15.2 (*) 1.7 - 7.7 K/uL   Lymphocytes Relative 8 (*) 12 - 46 %   Lymphs Abs 1.3  0.7 - 4.0 K/uL   Monocytes Relative 3  3 - 12 %   Monocytes Absolute 0.5  0.1 - 1.0 K/uL   Eosinophils Relative 0  0 - 5 %   Eosinophils Absolute 0.0  0.0 - 0.7 K/uL   Basophils Relative 0  0 - 1 %   Basophils Absolute 0.0  0.0 - 0.1 K/uL  COMPREHENSIVE METABOLIC PANEL      Result Value Range   Sodium 138  135 - 145 mEq/L   Potassium 3.7  3.5 - 5.1 mEq/L   Chloride 101  96 - 112 mEq/L   CO2 23  19 - 32 mEq/L   Glucose, Bld 178 (*) 70 - 99 mg/dL   BUN 14  6 - 23 mg/dL   Creatinine, Ser 0.86  0.50 - 1.10 mg/dL   Calcium 9.8  8.4 - 57.8 mg/dL   Total Protein 7.8  6.0 - 8.3 g/dL   Albumin 4.1  3.5 - 5.2 g/dL   AST 13  0 - 37 U/L   ALT 12  0 - 35 U/L   Alkaline Phosphatase 87  39 - 117 U/L   Total Bilirubin 0.4  0.3 - 1.2 mg/dL   GFR calc non Af Amer >90  >90 mL/min   GFR calc Af Amer >90  >90 mL/min       Imaging Review   Ct Abdomen Pelvis W Contrast  10/16/2013      IMPRESSION: No evidence of persistent colitis or other acute findings. Diverticulosis again noted, without radiographic evidence of diverticulitis.  Stable small paraumbilical hernia containing only fat.  Mild hepatic steatosis.   Electronically Signed   By: Myles Rosenthal M.D.   On: 10/16/2013 14:26   EKG Interpretation    Date/Time:  Wednesday October 28 2013 16:22:49 EST Ventricular Rate:  83 PR Interval:  189 QRS Duration: 93 QT Interval:  386 QTC Calculation: 453 R Axis:   98 Text Interpretation:  Sinus rhythm Borderline right axis deviation Baseline wander in lead(s) V6 No significant change since last tracing Confirmed by Harlea Goetzinger  MD-I, Kendry Pfarr ([id number]) on 10/28/2013 4:49:53 PM            MDM   1. Nausea and vomiting   2. Bronchitis   3. Sinusitis    New Prescriptions   DEXTROMETHORPHAN-GUAIFENESIN (MUCINEX DM) 30-600 MG  PER 12 HR TABLET    Take 1 tablet by mouth 2 (two) times daily.   LORATADINE (CLARITIN) 10 MG TABLET    Take 1 tablet (10 mg total) by mouth daily.   ONDANSETRON (ZOFRAN ODT) 8 MG DISINTEGRATING TABLET    Take 1 tablet (8 mg total) by mouth every 8 (eight) hours as needed for nausea or vomiting.    Plan discharge   Devoria Albe, MD, Franz Dell, MD 10/28/13 1539  Ward Givens, MD 10/28/13 1610  Ward Givens, MD 10/28/13 1650

## 2013-10-28 NOTE — ED Notes (Signed)
When this RN attempted to d/c Pt, her visitor reported that when the Pt tried to get up to get dressed she became "pale, lightheaded and her chest began to hurt."  Sts she was recently seen for a syncopal episode.  Lars Mage MD notified.

## 2013-10-28 NOTE — ED Notes (Signed)
Pt c/o nausea and vomiting that started today when she woke up. Pt reports 4-5 episodes of vomiting.

## 2013-10-28 NOTE — ED Provider Notes (Signed)
I received this patient in signout from Dr. Lynelle Doctor.  Patient was to be discharged home. At discharge, patient developed nausea, vomiting, and chest pain. EKG, chest x-ray, and troponin were obtained. These were pending at time of signout. EKG is nonischemic and troponin is negative.  Patient continues to have nausea here. She was given Phenergan and another normal saline bolus. She's been unable to tolerate by mouth. Given acute onset of chest pain and recurrent vomiting, patient will be admitted for hydration and serial enzymes.    Shon Baton, MD 10/29/13 [phone]

## 2013-10-28 NOTE — ED Notes (Signed)
Family at bedside.  Pt asleep.  Will cont to monitor.

## 2013-10-28 NOTE — ED Notes (Signed)
Knapp, MD at bedside.  

## 2013-10-28 NOTE — H&P (Addendum)
[facility] History and Physical  [name] [name] WUJ:[id number] DOB: Apr 21, 1961 DOA: 10/28/2013  Referring physician: ED PCP: Kerby Nora, MD   Chief Complaint:  Nausea and vomiting x 1 day  HPI:  52 year old female with history of type 2 diabetes mellitus on insulin, hyperlipidemia, hypothyroidism, irritable bowel syndrome, who was admitted 5 weeks back for acute colitis presented to the ED with acute onset of several episodes of nausea and vomiting since early this morning. She reports having some ham and green beans from outside last evening. She was the only one in the family who consented for the. Denies any sick contacts, denies fever, chills, headache, blurred vision, dizziness,  Palpitations, shortness of breath, dysuria, diarrhea, abdominal pain or joint pains. In the ED her vitals were stable but appeared dehydrated she was given antiemetics and IV fluids with some improvement in her symptoms. A chest x-ray done was unremarkable. However while attempting to get up to be discharged he began feeling extremely nauseous and started vomiting again and was unable to keep anything down. She also complained of substernal chest discomfort which he describes as burning with episode of vomiting. An EKG done was unremarkable . initial troponin was negative. [facility] consulted for admission under observation. Patient was followed up by her PCP 2 weeks back and had a repeat CT scan of her abdomen which showed resolution of the colitis seen previously.   Review of Systems:  Constitutional: Denies fever, chills, diaphoresis, appetite change and fatigue.  HEENT: Denies photophobia, eye pain, redness, hearing loss, ear pain, congestion, sore throat, rhinorrhea, sneezing, mouth sores, trouble swallowing, neck pain, neck stiffness and tinnitus.   Respiratory: Denies SOB, DOE, cough, chest tightness,  and wheezing.   Cardiovascular: Denies chest pain, palpitations and leg swelling.   Gastrointestinal:  nausea, vomiting, Denies abdominal pain, diarrhea, constipation, blood in stool and abdominal distention.  Genitourinary: Denies dysuria, urgency, frequency, hematuria, flank pain and difficulty urinating.  Endocrine: Denies polyuria, polydipsia. Musculoskeletal: Denies myalgias, back pain, joint swelling, arthralgias and gait problem.  Skin: Denies pallor, rash and wound.  Neurological: Denies dizziness, seizures, syncope, weakness, light-headedness, numbness and headaches.     Past Medical History  Diagnosis Date  . Osteoporosis   . Intermittent vertigo   . B12 deficiency   . Peripheral neuropathy   . GERD (gastroesophageal reflux disease)   . Anxiety   . Hyperlipidemia   . Hypothyroidism   . IBS (irritable bowel syndrome)   . Hypertension   . Leg cramping     "@ night" (09/18/2013)  . Type II diabetes mellitus   . Umbilical hernia     unrepaired (09/18/2013)  . Migraine     "once q couple months" (09/18/2013)   Past Surgical History  Procedure Laterality Date  . Cesarean section  1989  . Laparoscopic assisted vaginal hysterectomy  09/23/2012    Procedure: LAPAROSCOPIC ASSISTED VAGINAL HYSTERECTOMY;  Surgeon: Mitchel Honour, DO;  Location: [facility];  Service: Gynecology;  Laterality: N/A;  pull Dr Vance Gather instrument  . Tubal ligation Bilateral 1992  . Vaginal hysterectomy  2013   Social History:  reports that she quit smoking about 22 years ago. Her smoking use included Cigarettes. She smoked 0.00 packs per day for .2 years. She has never used smokeless tobacco. She reports that she does not drink alcohol or use illicit drugs.  Allergies  Allergen Reactions  . Codeine Hives, Anxiety and Other (See Comments)    REACTION: N \\T \ V and hyper. Pt can take  Percocet.    Family History  Problem Relation Age of Onset  . Heart failure Mother   . Diabetes Maternal Grandmother   . Alzheimer's disease Maternal Grandmother   . Coronary artery disease Maternal  Grandmother   . Heart attack Maternal Grandfather     Prior to Admission medications   Medication Sig Start Date End Date Taking? Authorizing Provider  insulin glargine (LANTUS) 100 UNIT/ML injection Inject 0.3 mLs (30 Units total) into the skin at bedtime. 10/13/13  Yes Amy Michelle Nasuti, MD  levothyroxine (SYNTHROID, LEVOTHROID) 200 MCG tablet Take 200 mcg by mouth daily before breakfast.   Yes Historical Provider, MD  levothyroxine (SYNTHROID, LEVOTHROID) 25 MCG tablet Take 25 mcg by mouth daily before breakfast.   Yes Historical Provider, MD  naproxen sodium (ANAPROX) 220 MG tablet Take 220 mg by mouth once.   Yes Historical Provider, MD  rosuvastatin (CRESTOR) 40 MG tablet Take 20 mg by mouth daily after supper.    Yes Historical Provider, MD  dextromethorphan-guaiFENesin (MUCINEX DM) 30-600 MG per 12 hr tablet Take 1 tablet by mouth 2 (two) times daily. 10/28/13   Ward Givens, MD  loratadine (CLARITIN) 10 MG tablet Take 1 tablet (10 mg total) by mouth daily. 10/28/13   Ward Givens, MD  ondansetron (ZOFRAN ODT) 8 MG disintegrating tablet Take 1 tablet (8 mg total) by mouth every 8 (eight) hours as needed for nausea or vomiting. 10/28/13   Ward Givens, MD    Physical Exam:  Filed Vitals:   10/28/13 1917 10/28/13 2000 10/28/13 2200 10/28/13 2325  BP: 146/87   133/70  Pulse: 86   77  Temp:    98.2 F (36.8 C)  TempSrc:    Oral  Resp: 22 24 19 16   Height:    4\' 11"  (1.499 m)  Weight:    66.2 kg (145 lb 15.1 oz)  SpO2: 99%   100%    Constitutional: Vital signs reviewed.  Middle aged female lying in bed in no acute distress HEENT: No pallor, no icterus, dry oral mucosa  Cardiovascular: RRR, S1 normal, S2 normal, no MRG, Pulmonary/Chest: CTAB, no wheezes, rales, or rhonchi Abdominal: Soft. Non-tender, non-distended, bowel sounds are normal, no masses, organomegaly, or guarding present.   extremities: Warm, no edema Neurological: A&O x3, nonfocal  Labs on Admission:  Basic Metabolic  Panel:  Recent Labs Lab 10/28/13 1330  NA 138  K 3.7  CL 101  CO2 23  GLUCOSE 178*  BUN 14  CREATININE 0.58  CALCIUM 9.8   Liver Function Tests:  Recent Labs Lab 10/28/13 1330  AST 13  ALT 12  ALKPHOS 87  BILITOT 0.4  PROT 7.8  ALBUMIN 4.1   No results found for this basename: LIPASE, AMYLASE,  in the last 168 hours No results found for this basename: AMMONIA,  in the last 168 hours CBC:  Recent Labs Lab 10/28/13 1330  WBC 17.0*  NEUTROABS 15.2*  HGB 15.6*  HCT 45.3  MCV 80.6  PLT 282   Cardiac Enzymes:  Recent Labs Lab 10/28/13 1640  TROPONINI <0.30   BNP: No components found with this basename: POCBNP,  CBG:  Recent Labs Lab 10/28/13 1316  GLUCAP 188*    Radiological Exams on Admission: Dg Chest 2 View  10/28/2013   CLINICAL DATA:  Cough, congestion  EXAM: CHEST  2 VIEW  COMPARISON:  09/19/2013  FINDINGS: The heart size and mediastinal contours are within normal limits. Both lungs are clear. The visualized  skeletal structures are unremarkable.  IMPRESSION: No active cardiopulmonary disease.   Electronically Signed   By: Esperanza Heir M.D.   On: 10/28/2013 14:14    EKG: NSR no ST- changes  Assessment/Plan  Principal Problem:   Acute gastroenteritis Admit under observation IV hydration, prn IV zofran, IV nexium Leucocytosis likely stress induced. No sign of infection -will start on  Active Problems:    Hyypothyroidism  resume synthroid    Type II or unspecified type diabetes mellitus without mention of complication, uncontrolled Place on SSI. Will give low dose lantus in am as pt has been NPO.  Chest pain  Atypical likely esophageal related to heartburn and vomiting. Resolved now. Does not to telemetry.    GERD PPI    Hyperlipidemia  continue statin  Diet: clear Liquids  DVT prophylaxis: sq lovenox  Code Status: full code  Family Communication: husband at bedside Disposition Plan: home once improved  Eddie North [facility] Pager [phone]  If 7PM-7AM, please contact night-coverage [email or web address] Password [facility] 10/28/2013, 11:31 PM   Total time spent: 55 minutes

## 2013-10-28 NOTE — ED Notes (Signed)
Pt unable to tolerate PO fluids d/t continual nausea.

## 2013-10-29 ENCOUNTER — Encounter (HOSPITAL_COMMUNITY): Payer: Self-pay | Admitting: *Deleted

## 2013-10-29 DIAGNOSIS — N182 Chronic kidney disease, stage 2 (mild): Secondary | ICD-10-CM | POA: Diagnosis present

## 2013-10-29 DIAGNOSIS — R112 Nausea with vomiting, unspecified: Secondary | ICD-10-CM

## 2013-10-29 HISTORY — DX: Chronic kidney disease, stage 2 (mild): N18.2

## 2013-10-29 LAB — CBC
Hemoglobin: 13 g/dL (ref 12.0–15.0)
MCH: 27 pg (ref 26.0–34.0)
MCHC: 33.4 g/dL (ref 30.0–36.0)
MCV: 80.7 fL (ref 78.0–100.0)
Platelets: 277 10*3/uL (ref 150–400)
RDW: 17.2 % — ABNORMAL HIGH (ref 11.5–15.5)

## 2013-10-29 LAB — GLUCOSE, CAPILLARY: Glucose-Capillary: 102 mg/dL — ABNORMAL HIGH (ref 70–99)

## 2013-10-29 MED ORDER — ONDANSETRON 8 MG PO TBDP: 8.0000 mg | ORAL_TABLET | Freq: Three times a day (TID) | ORAL | Status: DC | PRN

## 2013-10-29 NOTE — Progress Notes (Signed)
UR completed.  Patient changed to inpatient status r/t requiring IVf @ 125cc/hr and IV antiemetics.

## 2013-10-29 NOTE — Discharge Summary (Signed)
Physician Discharge Summary  [name]:[id number] DOB: 20-Nov-1961 DOA: 10/28/2013  PCP: Kerby Nora, MD  Admit date: 10/28/2013 Discharge date: 10/29/2013  Recommendations for Outpatient Follow-up:  1. Free T3 and free T4 checked, followup final results. 2. Close followup with PCP for evaluation of glycemic control. 3. Consider discontinuation of Naprosyn given proteinuria and evidence of stage II chronic kidney disease.  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  Diet recommendation: Carbohydrate modified.  History of present illness:  [name] [name] is an 52 y.o. female with PMH of type 2 diabetes requiring insulin therapy (last hemoglobin A1c 8.2% on 10/06/2013), hyperlipidemia, hypothyroidism, chronic abdominal pain and irritable bowel syndrome (without any evidence of inflammatory bowel disease) with recent admission for acute colitis who was re-admitted on 10/28/2013 with nausea and vomiting thought to be from gastroenteritis.  Hospital Course by problem:  Principal Problem:  Acute gastroenteritis  Admitted for observation and treated conservatively with IV fluids, antinausea medications, and PPI therapy. Symptoms resolved by the time of discharge. Active Problems:  Hyperlipidemia  Continue Crestor.  HYPOTHYROIDISM  TSH checked 10/23/2013:23.39. PCP planned to check a free T3 and free T4 levels. Will order this here. Currently on 225 mcg of Synthroid daily.  Type II DIABETES mellitus, uncontrolled, with renal and neurological complications / stage II chronic kidney disease  Hemoglobin A1c 8.2% on 10/06/2013. Noted to have proteinuria on urinalysis and a history of  neuropathy. GFR 69.6. Will need tighter glycemic control to prevent progression of kidney disease and neuropathy. Would avoid NSAIDs if possible (Naprosyn on hold during hospital stay, consider discontinuation of this medication by PCP). Currently on Lantus 15 units daily along with moderate sliding scale. CBGs 148-188. Resume home dose of Lantus at discharge. GERD  Treated with PPI therapy.  HYPERTENSION  Not on routine antihypertensives and blood pressure currently within normal limits.  Nausea and vomiting in adult  Conservative treatment with antinausea medications.   Procedures:  None.  Consultations:  None.  Discharge Exam: Filed Vitals:   10/29/13 0525  BP: 118/72  Pulse: 83  Temp: 99.1 F (37.3 C)  Resp: 16   Filed Vitals:   10/28/13 2000 10/28/13 2200 10/28/13 2325 10/29/13 0525  BP:   133/70 118/72  Pulse:   77 83  Temp:   98.2 F (36.8 C) 99.1 F (37.3 C)  TempSrc:   Oral Oral  Resp: 24 19 16 16   Height:   4\' 11"  (1.499 m)   Weight:   66.2 kg (145 lb 15.1 oz)   SpO2:   100% 98%    Gen:  NAD Cardiovascular:  RRR, No M/R/G Respiratory: Lungs CTAB Gastrointestinal: Abdomen soft, NT/ND with normal active bowel sounds. Extremities: No C/E/C   Discharge Instructions  Discharge Orders   Future Appointments Provider Department Dept Phone   11/12/2013 11:00 AM Mardella Layman, MD [facility] [phone]   01/08/2014 8:15 AM [facility] [place] [phone]   01/15/2014 8:15 AM Amy Michelle Nasuti, MD  [facility]Care at [facility] [phone]   Future Orders Complete By Expires   Call MD for:  persistant nausea and vomiting  As directed  Call MD for:  severe uncontrolled pain  As directed    Call MD for:  temperature >100.4  As directed    Diet Carb Modified  As directed    Discharge instructions  As directed    Comments:     You were cared for by Dr. Hillery Aldo  (a hospitalist) during your hospital  stay. If you have any questions about your discharge medications or the care you received while you were in the hospital after you are discharged, you can call the unit and ask to speak with the hospitalist on call if the hospitalist that took care of you is not available. Once you are discharged, your primary care physician will handle any further medical issues. Please note that NO REFILLS for any discharge medications will be authorized once you are discharged, as it is imperative that you return to your primary care physician (or establish a relationship with a primary care physician if you do not have one) for your aftercare needs so that they can reassess your need for medications and monitor your lab values.  Any outstanding tests can be reviewed by your PCP at your follow up visit.  It is also important to review any medicine changes with your PCP.  Please bring these d/c instructions with you to your next visit so your physician can review these changes with you.  If you do not have a primary care physician, you can call [phone] for a physician referral.  It is highly recommended that you obtain a PCP for hospital follow up.   Increase activity slowly  As directed        Medication List         dextromethorphan-guaiFENesin 30-600 MG per 12 hr tablet  Commonly known as:  MUCINEX DM  Take 1 tablet by mouth 2 (two) times daily.     insulin glargine 100 UNIT/ML injection  Commonly known as:  LANTUS  Inject 0.3 mLs (30 Units total) into the skin at bedtime.     levothyroxine 200 MCG tablet  Commonly known as:  SYNTHROID, LEVOTHROID  Take 200 mcg by mouth daily before breakfast.     levothyroxine 25 MCG tablet  Commonly known as:  SYNTHROID, LEVOTHROID  Take 25 mcg by mouth daily before breakfast.     loratadine 10 MG tablet  Commonly known as:  CLARITIN  Take 1 tablet (10 mg total) by mouth daily.     naproxen sodium 220 MG tablet  Commonly known as:  ANAPROX  Take 220 mg by  mouth once.     ondansetron 8 MG disintegrating tablet  Commonly known as:  ZOFRAN ODT  Take 1 tablet (8 mg total) by mouth every 8 (eight) hours as needed for nausea or vomiting.     rosuvastatin 40 MG tablet  Commonly known as:  CRESTOR  Take 20 mg by mouth daily after supper.           Follow-up Information   Follow up with Kerby Nora, MD.   Specialty:  Family Medicine   Contact information:   [address] [place] [address] [phone]       Schedule an appointment as soon as possible for a visit in 1 week to follow up. [facility] follow up and to check on your thyroid test results.)        The results of significant diagnostics from this hospitalization (including imaging, microbiology, ancillary and laboratory) are listed below  for reference.    Significant Diagnostic Studies: Dg Chest 2 View  10/28/2013   CLINICAL DATA:  Cough, congestion  EXAM: CHEST  2 VIEW  COMPARISON:  09/19/2013  FINDINGS: The heart size and mediastinal contours are within normal limits. Both lungs are clear. The visualized skeletal structures are unremarkable.  IMPRESSION: No active cardiopulmonary disease.   Electronically Signed   By: Esperanza Heir M.D.   On: 10/28/2013 14:14   Ct Abdomen Pelvis W Contrast  10/16/2013   CLINICAL DATA:  Refractory abdominal pain. Colitis. Diverticulosis.  EXAM: CT ABDOMEN AND PELVIS WITH CONTRAST  TECHNIQUE: Multidetector CT imaging of the abdomen and pelvis was performed using the standard protocol following bolus administration of intravenous contrast.  CONTRAST:  OMNIPAQUE IOHEXOL 300 MG/ML  SOLN  COMPARISON:  09/18/2013 and 07/13/2013  FINDINGS: Mild hepatic steatosis is stable, as well as tiny sub cm low-attenuation lesion in the left hepatic lobe which is too small to characterize but most likely benign. No new or enlarging liver lesions are identified. Gallbladder is unremarkable. The pancreas, spleen, adrenal  glands, and kidneys are normal in appearance. No evidence of hydronephrosis. No soft tissue masses or lymphadenopathy identified within the abdomen or pelvis.  Previously described colonic wall thickening is no longer visualized. No other inflammatory process or abnormal fluid collections are identified. Colonic diverticulosis is again noted, however there is no evidence of diverticulitis. Normal appendix is visualized. Prior hysterectomy noted. Adnexal regions are unremarkable in appearance. Small paraumbilical hernia again seen containing only fat. No evidence of herniated bowel loops.  IMPRESSION: No evidence of persistent colitis or other acute findings. Diverticulosis again noted, without radiographic evidence of diverticulitis.  Stable small paraumbilical hernia containing only fat.  Mild hepatic steatosis.   Electronically Signed   By: Myles Rosenthal M.D.   On: 10/16/2013 14:26    Labs:  Basic Metabolic Panel:  Recent Labs Lab 10/28/13 1330  NA 138  K 3.7  CL 101  CO2 23  GLUCOSE 178*  BUN 14  CREATININE 0.58  CALCIUM 9.8   GFR Estimated Creatinine Clearance: 69.6 ml/min (by C-G formula based on Cr of 0.58). Liver Function Tests:  Recent Labs Lab 10/28/13 1330  AST 13  ALT 12  ALKPHOS 87  BILITOT 0.4  PROT 7.8  ALBUMIN 4.1   CBC:  Recent Labs Lab 10/28/13 1330 10/29/13 0451  WBC 17.0* 14.6*  NEUTROABS 15.2*  --   HGB 15.6* 13.0  HCT 45.3 38.9  MCV 80.6 80.7  PLT 282 277   Cardiac Enzymes:  Recent Labs Lab 10/28/13 1640  TROPONINI <0.30   CBG:  Recent Labs Lab 10/28/13 1316 10/28/13 2344 10/29/13 0758 10/29/13 1157  GLUCAP 188* 148* 78 102*    Time coordinating discharge: 25 minutes.  Signed:  Ninoska Goswick  Pager [phone] [facility] 10/29/2013, 1:46 PM

## 2013-11-12 ENCOUNTER — Ambulatory Visit: Payer: 59 | Admitting: Gastroenterology

## 2013-12-24 ENCOUNTER — Other Ambulatory Visit: Payer: Self-pay | Admitting: Gastroenterology

## 2013-12-24 NOTE — Telephone Encounter (Signed)
Request for refill on Tramadol, ok to refill?

## 2013-12-25 ENCOUNTER — Encounter: Payer: Self-pay | Admitting: Cardiology

## 2014-01-08 ENCOUNTER — Other Ambulatory Visit (INDEPENDENT_AMBULATORY_CARE_PROVIDER_SITE_OTHER): Payer: 59

## 2014-01-08 DIAGNOSIS — E109 Type 1 diabetes mellitus without complications: Secondary | ICD-10-CM

## 2014-01-08 DIAGNOSIS — E1165 Type 2 diabetes mellitus with hyperglycemia: Principal | ICD-10-CM

## 2014-01-08 DIAGNOSIS — IMO0001 Reserved for inherently not codable concepts without codable children: Secondary | ICD-10-CM

## 2014-01-08 DIAGNOSIS — E039 Hypothyroidism, unspecified: Secondary | ICD-10-CM

## 2014-01-08 LAB — COMPREHENSIVE METABOLIC PANEL
ALK PHOS: 66 U/L (ref 39–117)
ALT: 11 U/L (ref 0–35)
AST: 12 U/L (ref 0–37)
Albumin: 3.7 g/dL (ref 3.5–5.2)
BUN: 11 mg/dL (ref 6–23)
CO2: 26 mEq/L (ref 19–32)
CREATININE: 0.8 mg/dL (ref 0.4–1.2)
Calcium: 8.8 mg/dL (ref 8.4–10.5)
Chloride: 104 mEq/L (ref 96–112)
GFR: 80.53 mL/min (ref >60.00)
GLUCOSE: 173 mg/dL — AB (ref 70–99)
POTASSIUM: 4.2 meq/L (ref 3.5–5.1)
Sodium: 136 mEq/L (ref 135–145)
Total Bilirubin: 0.4 mg/dL (ref 0.3–1.2)
Total Protein: 7.2 g/dL (ref 6.0–8.3)

## 2014-01-08 LAB — T4, FREE: Free T4: 0.56 ng/dL — ABNORMAL LOW (ref 0.60–1.60)

## 2014-01-08 LAB — LIPID PANEL
CHOL/HDL RATIO: 6
CHOLESTEROL: 210 mg/dL — AB (ref 0–200)
HDL: 36.8 mg/dL — AB (ref >39.00)
TRIGLYCERIDES: 128 mg/dL (ref 0.0–149.0)
VLDL: 25.6 mg/dL (ref 0.0–40.0)

## 2014-01-08 LAB — LDL CHOLESTEROL, DIRECT: LDL DIRECT: 164.4 mg/dL

## 2014-01-08 LAB — HEMOGLOBIN A1C: HEMOGLOBIN A1C: 7.1 % — AB (ref 4.6–6.5)

## 2014-01-08 LAB — T3, FREE: T3, Free: 2.6 pg/mL (ref 2.3–4.2)

## 2014-01-15 ENCOUNTER — Ambulatory Visit: Payer: 59 | Admitting: Family Medicine

## 2014-01-19 ENCOUNTER — Ambulatory Visit (INDEPENDENT_AMBULATORY_CARE_PROVIDER_SITE_OTHER): Payer: 59 | Admitting: Family Medicine

## 2014-01-19 ENCOUNTER — Encounter: Payer: Self-pay | Admitting: Family Medicine

## 2014-01-19 VITALS — BP 130/72 | HR 95 | Temp 97.9°F | Ht 59.5 in | Wt 150.2 lb

## 2014-01-19 DIAGNOSIS — G43009 Migraine without aura, not intractable, without status migrainosus: Secondary | ICD-10-CM

## 2014-01-19 DIAGNOSIS — IMO0002 Reserved for concepts with insufficient information to code with codable children: Secondary | ICD-10-CM

## 2014-01-19 DIAGNOSIS — I10 Essential (primary) hypertension: Secondary | ICD-10-CM

## 2014-01-19 DIAGNOSIS — E1129 Type 2 diabetes mellitus with other diabetic kidney complication: Secondary | ICD-10-CM

## 2014-01-19 DIAGNOSIS — E785 Hyperlipidemia, unspecified: Secondary | ICD-10-CM

## 2014-01-19 DIAGNOSIS — E1165 Type 2 diabetes mellitus with hyperglycemia: Principal | ICD-10-CM

## 2014-01-19 DIAGNOSIS — R109 Unspecified abdominal pain: Secondary | ICD-10-CM

## 2014-01-19 DIAGNOSIS — E039 Hypothyroidism, unspecified: Secondary | ICD-10-CM

## 2014-01-19 LAB — HM DIABETES FOOT EXAM

## 2014-01-19 MED ORDER — ONDANSETRON 8 MG PO TBDP: 8.0000 mg | ORAL_TABLET | Freq: Three times a day (TID) | ORAL | Status: DC | PRN

## 2014-01-19 MED ORDER — ELETRIPTAN HYDROBROMIDE 40 MG PO TABS: 40.0000 mg | ORAL_TABLET | ORAL | Status: DC | PRN

## 2014-01-19 MED ORDER — LEVOTHYROXINE SODIUM 200 MCG PO TABS: 200.0000 ug | ORAL_TABLET | Freq: Every day | ORAL | Status: DC

## 2014-01-19 MED ORDER — LEVOTHYROXINE SODIUM 25 MCG PO TABS: 25.0000 ug | ORAL_TABLET | Freq: Every day | ORAL | Status: DC

## 2014-01-19 MED ORDER — EZETIMIBE 10 MG PO TABS: 10.0000 mg | ORAL_TABLET | Freq: Every day | ORAL | Status: DC

## 2014-01-19 NOTE — Assessment & Plan Note (Signed)
If returns .Marland Kitchen Refer back top GI.

## 2014-01-19 NOTE — Patient Instructions (Addendum)
Continue crestor, add zetia to diet.  Continue 30 units of lantus. Check blood pressure if having any vertigo episodes continue.  Follow up in 3 months for 30 min OV for DM, high chol with fasting labs prior.  Can try relpax for migraine.

## 2014-01-19 NOTE — Progress Notes (Signed)
Pre-visit discussion using our clinic review tool. No additional management support is needed unless otherwise documented below in the visit note.  

## 2014-01-19 NOTE — Assessment & Plan Note (Signed)
Improved control on lantus 30 units daily. Encouraged exercise, weight loss, healthy eating habits.

## 2014-01-19 NOTE — Assessment & Plan Note (Signed)
Trial of relpax.

## 2014-01-19 NOTE — Progress Notes (Signed)
Recent hospital admisison in 09/2013   Admitted to hospitalist service with diagnosis of nonspecific colitis shown on CT with WBC 19.8. Pt became unresponsive around 1820 on 10/10 and code blue was called for respiratory arrest but never lost pulse (was given fentanyl several hours earlier) Pt became responsive after 3 chest compressions and was transferred to the ICU for PCCM management. Thought to be vasovagal syncope from pain and nausea.  She was treated with antibiotics with improvement of symptoms. She still has small amount of blood in stool.  No changes in DM and home regimen.   Was waiting cardiac clearance per Dr. Buel Ream requesrt prior to endoscopic procedure. She had bad relationship with Dr. Sharlett Iles and she does not want to return to see him. She states now that she has had a significant improvement in symptoms, occ blood in stool from hemorrhoids.  Hypothyroid: free t3 remains nml but free t4 is a little low now... On 225 mcg daily.  Diabetes: Much improved on lantus 30 Units daily. Have had some improvement but not enough.  Lab Results  Component Value Date   HGBA1C 7.1* 01/08/2014   Using medications without difficulties:None... Wishes to change to other insulin to be able to give insulin in different locations than abdomen.  Hypoglycemic episodes: None  Hyperglycemic episodes:None  Feet problems: None  Blood Sugars averaging: 78-132 fasting  eye exam within last year:due this month  Elevated Cholesterol: Poor control on crestor  20 mg daily.  Lab Results  Component Value Date   CHOL 210* 01/08/2014   HDL 36.80* 01/08/2014   LDLCALC 125* 11/27/2011   LDLDIRECT 164.4 01/08/2014   TRIG 128.0 01/08/2014   CHOLHDL 6 01/08/2014  Using medications without problems: None  Muscle aches: None  Diet compliance: Has been working on low fat diet, avoids fried foods.  Exercise:walking off and on.  Other complaints:   Hypertension: BP at goal on current regimen.  BP Readings  from Last 3 Encounters:  01/19/14 130/72  10/29/13 109/72  10/13/13 104/76  Using medication without problems or lightheadedness: occ vertigo at times lasting 5 min, Usually with sitting to standing.  Chest pain with exertion:None  Edema:None  Short of breath:None  Average home BPs: at goal < 130/80  Other issues:   Imitrex no longer helping well with migraine. Wants to try something else. Review of Systems  Constitutional: Negative for fever and fatigue.  HENT: Negative for ear pain.  Eyes: Negative for pain.  Respiratory: Negative for chest tightness and shortness of breath.  Cardiovascular: Negative for chest pain, palpitations and leg swelling.  Gastrointestinal: Positive for abdominal pain.  Genitourinary: Negative for dysuria.  Objective:   Physical Exam  Constitutional: Vital signs are normal. She appears well-developed and well-nourished. She is cooperative. Non-toxic appearance. She does not appear ill. No distress.  HENT:  Head: Normocephalic.  Right Ear: Hearing, tympanic membrane, external ear and ear canal normal. Tympanic membrane is not erythematous, not retracted and not bulging.  Left Ear: Hearing, tympanic membrane, external ear and ear canal normal. Tympanic membrane is not erythematous, not retracted and not bulging.  Nose: No mucosal edema or rhinorrhea. Right sinus exhibits no maxillary sinus tenderness and no frontal sinus tenderness. Left sinus exhibits no maxillary sinus tenderness and no frontal sinus tenderness.  Mouth/Throat: Uvula is midline, oropharynx is clear and moist and mucous membranes are normal.  Eyes: Conjunctivae, EOM and lids are normal. Pupils are equal, round, and reactive to light. Lids are everted and swept, no  foreign bodies found.  Neck: Trachea normal and normal range of motion. Neck supple. Carotid bruit is not present. No mass and no thyromegaly present.  Cardiovascular: Normal rate, regular rhythm, S1 normal, S2 normal, normal heart  sounds, intact distal pulses and normal pulses. Exam reveals no gallop and no friction rub.  No murmur heard.  Pulmonary/Chest: Effort normal and breath sounds normal. Not tachypneic. No respiratory distress. She has no decreased breath sounds. She has no wheezes. She has no rhonchi. She has no rales.  Abdominal: Soft. Normal appearance and bowel sounds are normal. There is no hepatosplenomegaly. There is tenderness in the diffusely There is no CVA tenderness.  Neurological: She is alert.  Skin: Skin is warm, dry and intact. No rash noted.  Psychiatric: Her speech is normal and behavior is normal. Judgment and thought content normal. Her mood appears not anxious. Cognition and memory are normal. She does not exhibit a depressed mood.   Diabetic foot exam:  Normal inspection  No skin breakdown  No calluses  Normal DP pulses  Normal sensation to light touch and monofilament  Nails normal

## 2014-01-19 NOTE — Assessment & Plan Note (Signed)
Well controlled. Continue current medication.  

## 2014-01-19 NOTE — Assessment & Plan Note (Signed)
Poor control on crestor, will try zetia.

## 2014-01-19 NOTE — Assessment & Plan Note (Signed)
Continue current dose of 225 mcg. Follow over time.

## 2014-01-20 ENCOUNTER — Telehealth: Payer: Self-pay

## 2014-01-20 ENCOUNTER — Telehealth: Payer: Self-pay | Admitting: Family Medicine

## 2014-01-20 NOTE — Telephone Encounter (Signed)
Relevant patient education assigned to patient using Emmi. ° °

## 2014-04-12 ENCOUNTER — Telehealth: Payer: Self-pay | Admitting: Family Medicine

## 2014-04-12 DIAGNOSIS — I10 Essential (primary) hypertension: Secondary | ICD-10-CM

## 2014-04-12 DIAGNOSIS — D72829 Elevated white blood cell count, unspecified: Secondary | ICD-10-CM

## 2014-04-12 DIAGNOSIS — IMO0002 Reserved for concepts with insufficient information to code with codable children: Secondary | ICD-10-CM

## 2014-04-12 DIAGNOSIS — E538 Deficiency of other specified B group vitamins: Secondary | ICD-10-CM

## 2014-04-12 DIAGNOSIS — E1129 Type 2 diabetes mellitus with other diabetic kidney complication: Secondary | ICD-10-CM

## 2014-04-12 DIAGNOSIS — E785 Hyperlipidemia, unspecified: Secondary | ICD-10-CM

## 2014-04-12 DIAGNOSIS — E1165 Type 2 diabetes mellitus with hyperglycemia: Secondary | ICD-10-CM

## 2014-04-12 DIAGNOSIS — E559 Vitamin D deficiency, unspecified: Secondary | ICD-10-CM

## 2014-04-12 NOTE — Telephone Encounter (Signed)
Message copied by Jinny Sanders on Mon Apr 12, 2014 11:21 PM ------      Message from: Ellamae Sia      Created: Thu Apr 01, 2014 10:54 AM      Regarding: Lab orders for Tuesday, 5.5.15       Lab orders for a 3 month f/u ------

## 2014-04-13 ENCOUNTER — Other Ambulatory Visit: Payer: 59

## 2014-04-14 ENCOUNTER — Other Ambulatory Visit (INDEPENDENT_AMBULATORY_CARE_PROVIDER_SITE_OTHER): Payer: 59

## 2014-04-14 DIAGNOSIS — I10 Essential (primary) hypertension: Secondary | ICD-10-CM

## 2014-04-14 DIAGNOSIS — E785 Hyperlipidemia, unspecified: Secondary | ICD-10-CM

## 2014-04-14 DIAGNOSIS — E1165 Type 2 diabetes mellitus with hyperglycemia: Secondary | ICD-10-CM

## 2014-04-14 DIAGNOSIS — E1129 Type 2 diabetes mellitus with other diabetic kidney complication: Secondary | ICD-10-CM

## 2014-04-14 DIAGNOSIS — IMO0002 Reserved for concepts with insufficient information to code with codable children: Secondary | ICD-10-CM

## 2014-04-14 LAB — COMPREHENSIVE METABOLIC PANEL
ALBUMIN: 3.7 g/dL (ref 3.5–5.2)
ALT: 17 U/L (ref 0–35)
AST: 17 U/L (ref 0–37)
Alkaline Phosphatase: 67 U/L (ref 39–117)
BUN: 11 mg/dL (ref 6–23)
CALCIUM: 8.9 mg/dL (ref 8.4–10.5)
CO2: 27 mEq/L (ref 19–32)
Chloride: 100 mEq/L (ref 96–112)
Creatinine, Ser: 0.8 mg/dL (ref 0.4–1.2)
GFR: 78.18 mL/min (ref >60.00)
Glucose, Bld: 211 mg/dL — ABNORMAL HIGH (ref 70–99)
POTASSIUM: 4.5 meq/L (ref 3.5–5.1)
SODIUM: 133 meq/L — AB (ref 135–145)
TOTAL PROTEIN: 6.9 g/dL (ref 6.0–8.3)
Total Bilirubin: 0.3 mg/dL (ref 0.2–1.2)

## 2014-04-14 LAB — LIPID PANEL
CHOL/HDL RATIO: 6
Cholesterol: 245 mg/dL — ABNORMAL HIGH (ref 0–200)
HDL: 38.9 mg/dL — ABNORMAL LOW (ref >39.00)
LDL CALC: 165 mg/dL — AB (ref 0–99)
Triglycerides: 205 mg/dL — ABNORMAL HIGH (ref 0.0–149.0)
VLDL: 41 mg/dL — AB (ref 0.0–40.0)

## 2014-04-14 LAB — HEMOGLOBIN A1C: Hgb A1c MFr Bld: 9.4 % — ABNORMAL HIGH (ref 4.6–6.5)

## 2014-04-20 ENCOUNTER — Ambulatory Visit: Payer: 59 | Admitting: Family Medicine

## 2014-04-27 ENCOUNTER — Ambulatory Visit (INDEPENDENT_AMBULATORY_CARE_PROVIDER_SITE_OTHER): Payer: 59 | Admitting: Family Medicine

## 2014-04-27 ENCOUNTER — Encounter: Payer: Self-pay | Admitting: Family Medicine

## 2014-04-27 ENCOUNTER — Other Ambulatory Visit: Payer: Self-pay | Admitting: Family Medicine

## 2014-04-27 VITALS — BP 102/70 | HR 91 | Temp 98.3°F | Ht 59.5 in | Wt 157.2 lb

## 2014-04-27 DIAGNOSIS — E1165 Type 2 diabetes mellitus with hyperglycemia: Principal | ICD-10-CM

## 2014-04-27 DIAGNOSIS — G43009 Migraine without aura, not intractable, without status migrainosus: Secondary | ICD-10-CM

## 2014-04-27 DIAGNOSIS — E785 Hyperlipidemia, unspecified: Secondary | ICD-10-CM

## 2014-04-27 DIAGNOSIS — K589 Irritable bowel syndrome without diarrhea: Secondary | ICD-10-CM

## 2014-04-27 DIAGNOSIS — E1129 Type 2 diabetes mellitus with other diabetic kidney complication: Secondary | ICD-10-CM

## 2014-04-27 DIAGNOSIS — IMO0002 Reserved for concepts with insufficient information to code with codable children: Secondary | ICD-10-CM

## 2014-04-27 MED ORDER — ATORVASTATIN CALCIUM 80 MG PO TABS: 80.0000 mg | ORAL_TABLET | Freq: Every day | ORAL | Status: DC

## 2014-04-27 MED ORDER — ELETRIPTAN HYDROBROMIDE 40 MG PO TABS: 40.0000 mg | ORAL_TABLET | ORAL | Status: DC | PRN

## 2014-04-27 MED ORDER — ONDANSETRON 8 MG PO TBDP: 8.0000 mg | ORAL_TABLET | Freq: Three times a day (TID) | ORAL | Status: DC | PRN

## 2014-04-27 MED ORDER — PENTAFLUOROPROP-TETRAFLUOROETH EX AERO: 1.0000 "application " | INHALATION_SPRAY | CUTANEOUS | Status: DC | PRN

## 2014-04-27 NOTE — Assessment & Plan Note (Signed)
Poor control. Zetia not helpful at all. DID not like welchol and ineffective.  Try changing crestor to lipitor 80. Recheck in 3 months.

## 2014-04-27 NOTE — Assessment & Plan Note (Signed)
Poor control. Increase lantus, titrate.  poor compliance with low sugar diet.

## 2014-04-27 NOTE — Progress Notes (Signed)
Pre visit review using our clinic review tool, if applicable. No additional management support is needed unless otherwise documented below in the visit note. 

## 2014-04-27 NOTE — Progress Notes (Signed)
53 year old female presents for 3 month follow up. Relpax working better for migraines.  Diabetes: Now worsened control on lantus 30 Units daily.  Good compliance with medication. No steroids. She has started back on some [facility] 4-5 a day!!!! Lab Results  Component Value Date   HGBA1C 9.4* 04/14/2014  Using medications without difficulties:None... Wishes to change to other insulin to be able to give insulin in different locations than abdomen.  Hypoglycemic episodes: None  Hyperglycemic episodes:None  Feet problems: None  Blood Sugars averaging: 180-199 fasting  eye exam within last year:due this month   Elevated Cholesterol: Poor control on crestor 20 mg daily. NO improvement with addition of zetia. Lab Results  Component Value Date   CHOL 245* 04/14/2014   HDL 38.90* 04/14/2014   LDLCALC 165* 04/14/2014   LDLDIRECT 164.4 01/08/2014   TRIG 205.0* 04/14/2014   CHOLHDL 6 04/14/2014    Muscle aches: None  Diet compliance:Cut out fast food. Trying to grill or bake now. She is still drinking [employer]. Exercise:walking off and on.  Other complaints:   Hypertension: BP at goal on current regimen.  BP Readings from Last 3 Encounters:  04/27/14 102/70  01/19/14 130/72  10/29/13 109/72  Using medication without problems or lightheadedness: occ vertigo at times lasting 5 min, Usually with sitting to standing.  Chest pain with exertion:None  Edema:None  Short of breath:None  Average home BPs: at goal < 130/80  Other issues:   Review of Systems  Constitutional: Negative for fever and fatigue.  HENT: Negative for ear pain.  Eyes: Negative for pain.  Respiratory: Negative for chest tightness and shortness of breath.  Cardiovascular: Negative for chest pain, palpitations and leg swelling.  Gastrointestinal: No abdominal pain, occ nausea. Genitourinary: Negative for dysuria.  Objective:   Physical Exam  Constitutional: Vital signs are normal. She appears well-developed and  well-nourished. She is cooperative. Non-toxic appearance. She does not appear ill. No distress.  HENT:  Head: Normocephalic.  Right Ear: Hearing, tympanic membrane, external ear and ear canal normal. Tympanic membrane is not erythematous, not retracted and not bulging.  Left Ear: Hearing, tympanic membrane, external ear and ear canal normal. Tympanic membrane is not erythematous, not retracted and not bulging.  Nose: No mucosal edema or rhinorrhea. Right sinus exhibits no maxillary sinus tenderness and no frontal sinus tenderness. Left sinus exhibits no maxillary sinus tenderness and no frontal sinus tenderness.  Mouth/Throat: Uvula is midline, oropharynx is clear and moist and mucous membranes are normal.  Eyes: Conjunctivae, EOM and lids are normal. Pupils are equal, round, and reactive to light. Lids are everted and swept, no foreign bodies found.  Neck: Trachea normal and normal range of motion. Neck supple. Carotid bruit is not present. No mass and no thyromegaly present.  Cardiovascular: Normal rate, regular rhythm, S1 normal, S2 normal, normal heart sounds, intact distal pulses and normal pulses. Exam reveals no gallop and no friction rub.  No murmur heard.  Pulmonary/Chest: Effort normal and breath sounds normal. Not tachypneic. No respiratory distress. She has no decreased breath sounds. She has no wheezes. She has no rhonchi. She has no rales.  Abdominal: Soft. Normal appearance and bowel sounds are normal. There is no hepatosplenomegaly.  Nontender to palpation. There is no CVA tenderness.  Neurological: She is alert.  Skin: Skin is warm, dry and intact. No rash noted.  Psychiatric: Her speech is normal and behavior is normal. Judgment and thought content normal. Her mood appears not anxious. Cognition and  memory are normal. She does not exhibit a depressed mood.   Diabetic foot exam:  Normal inspection  No skin breakdown  No calluses  Normal DP pulses  Normal sensation to light  touch and monofilament  Nails normal

## 2014-04-27 NOTE — Patient Instructions (Addendum)
Stop mountain dew and change to water or crystal light. Increase Lantus to 35 units daily. Increase every 2 days by 2 units until at goal fasting < 120. Stop crestor and zetia. Change to atorvastatin 80 mg daily. Follow up in 3 months with fasting labs prior.

## 2014-04-27 NOTE — Assessment & Plan Note (Signed)
Improved with diet changes. 

## 2014-04-27 NOTE — Assessment & Plan Note (Signed)
relpax treats acute migraine better than other triptans.

## 2014-06-04 ENCOUNTER — Encounter: Payer: Self-pay | Admitting: Family Medicine

## 2014-06-04 ENCOUNTER — Telehealth: Payer: Self-pay | Admitting: Family Medicine

## 2014-06-04 ENCOUNTER — Ambulatory Visit (INDEPENDENT_AMBULATORY_CARE_PROVIDER_SITE_OTHER): Payer: 59 | Admitting: Family Medicine

## 2014-06-04 VITALS — BP 100/66 | HR 81 | Temp 98.1°F | Ht 59.5 in | Wt 155.5 lb

## 2014-06-04 DIAGNOSIS — N760 Acute vaginitis: Secondary | ICD-10-CM | POA: Insufficient documentation

## 2014-06-04 DIAGNOSIS — R300 Dysuria: Secondary | ICD-10-CM

## 2014-06-04 DIAGNOSIS — E1165 Type 2 diabetes mellitus with hyperglycemia: Principal | ICD-10-CM

## 2014-06-04 DIAGNOSIS — N182 Chronic kidney disease, stage 2 (mild): Secondary | ICD-10-CM

## 2014-06-04 DIAGNOSIS — R109 Unspecified abdominal pain: Secondary | ICD-10-CM

## 2014-06-04 DIAGNOSIS — E1129 Type 2 diabetes mellitus with other diabetic kidney complication: Secondary | ICD-10-CM

## 2014-06-04 DIAGNOSIS — IMO0002 Reserved for concepts with insufficient information to code with codable children: Secondary | ICD-10-CM

## 2014-06-04 LAB — CBC WITH DIFFERENTIAL/PLATELET
BASOS ABS: 0 10*3/uL (ref 0.0–0.1)
Basophils Relative: 0.3 % (ref 0.0–3.0)
Eosinophils Absolute: 0.2 10*3/uL (ref 0.0–0.7)
Eosinophils Relative: 1.7 % (ref 0.0–5.0)
HEMATOCRIT: 47.5 % — AB (ref 36.0–46.0)
Hemoglobin: 15.8 g/dL — ABNORMAL HIGH (ref 12.0–15.0)
LYMPHS ABS: 2.8 10*3/uL (ref 0.7–4.0)
Lymphocytes Relative: 25.4 % (ref 12.0–46.0)
MCHC: 33.3 g/dL (ref 30.0–36.0)
MCV: 86.5 fl (ref 78.0–100.0)
MONO ABS: 0.6 10*3/uL (ref 0.1–1.0)
Monocytes Relative: 5.7 % (ref 3.0–12.0)
Neutro Abs: 7.3 10*3/uL (ref 1.4–7.7)
Neutrophils Relative %: 66.9 % (ref 43.0–77.0)
PLATELETS: 308 10*3/uL (ref 150.0–400.0)
RBC: 5.5 Mil/uL — ABNORMAL HIGH (ref 3.87–5.11)
RDW: 16.8 % — AB (ref 11.5–15.5)
WBC: 11 10*3/uL — ABNORMAL HIGH (ref 4.0–10.5)

## 2014-06-04 LAB — POCT URINALYSIS DIPSTICK
Bilirubin, UA: NEGATIVE
Ketones, UA: NEGATIVE
Leukocytes, UA: NEGATIVE
Nitrite, UA: NEGATIVE
Spec Grav, UA: 1.015
Urobilinogen, UA: 0.2
pH, UA: 6

## 2014-06-04 LAB — COMPREHENSIVE METABOLIC PANEL
ALT: 15 U/L (ref 0–35)
AST: 19 U/L (ref 0–37)
Albumin: 4.2 g/dL (ref 3.5–5.2)
Alkaline Phosphatase: 83 U/L (ref 39–117)
BILIRUBIN TOTAL: 0.3 mg/dL (ref 0.2–1.2)
BUN: 7 mg/dL (ref 6–23)
CO2: 24 mEq/L (ref 19–32)
Calcium: 9.1 mg/dL (ref 8.4–10.5)
Chloride: 99 mEq/L (ref 96–112)
Creatinine, Ser: 0.8 mg/dL (ref 0.4–1.2)
GFR: 84.02 mL/min (ref >60.00)
Glucose, Bld: 295 mg/dL — ABNORMAL HIGH (ref 70–99)
Potassium: 4.3 mEq/L (ref 3.5–5.1)
SODIUM: 134 meq/L — AB (ref 135–145)
TOTAL PROTEIN: 7.8 g/dL (ref 6.0–8.3)

## 2014-06-04 MED ORDER — BENZOCAINE-RESORCINOL 20-3 % VA CREA: TOPICAL_CREAM | VAGINAL | Status: DC

## 2014-06-04 MED ORDER — FLUCONAZOLE 150 MG PO TABS: 150.0000 mg | ORAL_TABLET | Freq: Once | ORAL | Status: DC

## 2014-06-04 MED ORDER — CIPROFLOXACIN HCL 500 MG PO TABS: 500.0000 mg | ORAL_TABLET | Freq: Two times a day (BID) | ORAL | Status: DC

## 2014-06-04 NOTE — Progress Notes (Signed)
Subjective:    Patient ID: [name] [name], female    DOB: 12/15/1960, 53 y.o.   MRN: [id number]  HPI Comments: She has history of chronic abdominal pain in past thought by GI to be IBS, colitis but this pain is different. She has been told she has  Hernia at last CT scan. 46/5035( small paraumbilical hernia containing only fat) The pain she is having is right over where she was told hernia is.  Dysuria  This is a new problem. The current episode started in the past 7 days (3-4 days). The problem occurs every urination. The quality of the pain is described as burning (burning is mainly when urine hits outside). The pain is at a severity of 6/10. The pain is moderate (she does have some pain in left uppper and lef mid-abdomen). There has been no fever. She is not sexually active. There is no history of pyelonephritis. Associated symptoms include frequency, hesitancy and nausea. Pertinent negatives include no chills, flank pain, hematuria, possible pregnancy, sweats, urgency or vomiting. Associated symptoms comments: vaginal itching and burning. Treatments tried: she has tried vaginsil for few days. The treatment provided no relief. There is no history of catheterization, kidney stones, recurrent UTIs, a single kidney, urinary stasis or a urological procedure. HX of DM and CKD stage 2      Review of Systems  Constitutional: Negative for fever, chills and fatigue.  HENT: Negative for ear pain.   Eyes: Negative for pain.  Respiratory: Negative for shortness of breath.   Cardiovascular: Negative for chest pain, palpitations and leg swelling.  Gastrointestinal: Positive for nausea and abdominal pain. Negative for vomiting, diarrhea, constipation, abdominal distention and anal bleeding.  Genitourinary: Positive for dysuria, hesitancy and frequency. Negative for urgency, hematuria and flank pain.       Objective:   Physical Exam  Constitutional: Vital signs are normal. She appears well-developed  and well-nourished. She is cooperative.  Non-toxic appearance. She does not appear ill. No distress.  Central obesity  HENT:  Head: Normocephalic.  Right Ear: Hearing, tympanic membrane, external ear and ear canal normal. Tympanic membrane is not erythematous, not retracted and not bulging.  Left Ear: Hearing, tympanic membrane, external ear and ear canal normal. Tympanic membrane is not erythematous, not retracted and not bulging.  Nose: No mucosal edema or rhinorrhea. Right sinus exhibits no maxillary sinus tenderness and no frontal sinus tenderness. Left sinus exhibits no maxillary sinus tenderness and no frontal sinus tenderness.  Mouth/Throat: Uvula is midline, oropharynx is clear and moist and mucous membranes are normal.  Eyes: Conjunctivae, EOM and lids are normal. Pupils are equal, round, and reactive to light. Lids are everted and swept, no foreign bodies found.  Neck: Trachea normal and normal range of motion. Neck supple. Carotid bruit is not present. No mass and no thyromegaly present.  Cardiovascular: Normal rate, regular rhythm, S1 normal, S2 normal, normal heart sounds, intact distal pulses and normal pulses.  Exam reveals no gallop and no friction rub.   No murmur heard. Pulmonary/Chest: Effort normal and breath sounds normal. Not tachypneic. No respiratory distress. She has no decreased breath sounds. She has no wheezes. She has no rhonchi. She has no rales.  Abdominal: Soft. Normal appearance and bowel sounds are normal. There is no hepatosplenomegaly. There is tenderness in the periumbilical area, left upper quadrant and left lower quadrant. There is guarding. There is no rigidity and no CVA tenderness. No hernia. Hernia confirmed negative in the ventral area.  No hernia  palpated  Neurological: She is alert.  Skin: Skin is warm, dry and intact. No rash noted.  Psychiatric: Her speech is normal and behavior is normal. Judgment and thought content normal. Her mood appears not  anxious. Cognition and memory are normal. She does not exhibit a depressed mood.          Assessment & Plan:

## 2014-06-04 NOTE — Patient Instructions (Addendum)
Will treat with cipro x 10 days and follow up early next week. Stop at lab on way out.  Push fluids, bland diet.  Go to ER if severe abdominal pain. Take diflucan x 1 now then repeat after antibiotics completed.  Follow up next Tuesday with me.

## 2014-06-04 NOTE — Telephone Encounter (Signed)
Patient returned your call.

## 2014-06-04 NOTE — Telephone Encounter (Signed)
Ms. Lortz notified labs show suggestion of possible infection ie: colitis. Treat with antibiotics as planned. Poor DM control, get back to healthy eating. Increase Lantus to 45 Units daily.  Patient states understanding.

## 2014-06-04 NOTE — Assessment & Plan Note (Addendum)
Not her typical pain from IBS per pt. UA is clear.  Not clearly from  Paraumbilical hernia  (not palpated and area small on past CT ( no past abdominal surgeries)  More concerning for recurrence of colitis . Will eval with labs stat. Will treat with cipro x 10 days and follow up early next week.

## 2014-06-04 NOTE — Progress Notes (Signed)
Pre visit review using our clinic review tool, if applicable. No additional management support is needed unless otherwise documented below in the visit note. 

## 2014-06-04 NOTE — Addendum Note (Signed)
Addended byEliezer Lofts E on: 06/04/2014 10:01 AM   Modules accepted: Orders

## 2014-06-08 ENCOUNTER — Ambulatory Visit: Payer: 59 | Admitting: Family Medicine

## 2014-06-08 DIAGNOSIS — Z0289 Encounter for other administrative examinations: Secondary | ICD-10-CM

## 2014-07-27 ENCOUNTER — Telehealth (INDEPENDENT_AMBULATORY_CARE_PROVIDER_SITE_OTHER): Payer: 59 | Admitting: Family Medicine

## 2014-07-27 ENCOUNTER — Other Ambulatory Visit (INDEPENDENT_AMBULATORY_CARE_PROVIDER_SITE_OTHER): Payer: 59

## 2014-07-27 DIAGNOSIS — E1165 Type 2 diabetes mellitus with hyperglycemia: Principal | ICD-10-CM

## 2014-07-27 DIAGNOSIS — E785 Hyperlipidemia, unspecified: Secondary | ICD-10-CM

## 2014-07-27 DIAGNOSIS — IMO0002 Reserved for concepts with insufficient information to code with codable children: Secondary | ICD-10-CM

## 2014-07-27 DIAGNOSIS — R7989 Other specified abnormal findings of blood chemistry: Secondary | ICD-10-CM

## 2014-07-27 DIAGNOSIS — E039 Hypothyroidism, unspecified: Secondary | ICD-10-CM

## 2014-07-27 DIAGNOSIS — E1129 Type 2 diabetes mellitus with other diabetic kidney complication: Secondary | ICD-10-CM

## 2014-07-27 DIAGNOSIS — E538 Deficiency of other specified B group vitamins: Secondary | ICD-10-CM

## 2014-07-27 DIAGNOSIS — M810 Age-related osteoporosis without current pathological fracture: Secondary | ICD-10-CM

## 2014-07-27 DIAGNOSIS — N182 Chronic kidney disease, stage 2 (mild): Secondary | ICD-10-CM

## 2014-07-27 DIAGNOSIS — I10 Essential (primary) hypertension: Secondary | ICD-10-CM

## 2014-07-27 DIAGNOSIS — E559 Vitamin D deficiency, unspecified: Secondary | ICD-10-CM

## 2014-07-27 DIAGNOSIS — D72829 Elevated white blood cell count, unspecified: Secondary | ICD-10-CM

## 2014-07-27 LAB — T3, FREE: T3, Free: 2.6 pg/mL (ref 2.3–4.2)

## 2014-07-27 LAB — COMPREHENSIVE METABOLIC PANEL
ALBUMIN: 3.8 g/dL (ref 3.5–5.2)
ALT: 28 U/L (ref 0–35)
AST: 29 U/L (ref 0–37)
Alkaline Phosphatase: 73 U/L (ref 39–117)
BUN: 11 mg/dL (ref 6–23)
CO2: 23 meq/L (ref 19–32)
Calcium: 8.9 mg/dL (ref 8.4–10.5)
Chloride: 99 mEq/L (ref 96–112)
Creatinine, Ser: 0.9 mg/dL (ref 0.4–1.2)
GFR: 74.92 mL/min (ref >60.00)
GLUCOSE: 399 mg/dL — AB (ref 70–99)
Potassium: 4.1 mEq/L (ref 3.5–5.1)
SODIUM: 132 meq/L — AB (ref 135–145)
Total Bilirubin: 0.4 mg/dL (ref 0.2–1.2)
Total Protein: 7.2 g/dL (ref 6.0–8.3)

## 2014-07-27 LAB — LIPID PANEL
CHOL/HDL RATIO: 7
Cholesterol: 255 mg/dL — ABNORMAL HIGH (ref 0–200)
HDL: 37.6 mg/dL — ABNORMAL LOW (ref >39.00)
NONHDL: 217.4
Triglycerides: 247 mg/dL — ABNORMAL HIGH (ref 0.0–149.0)
VLDL: 49.4 mg/dL — ABNORMAL HIGH (ref 0.0–40.0)

## 2014-07-27 LAB — T4, FREE: Free T4: 0.56 ng/dL — ABNORMAL LOW (ref 0.60–1.60)

## 2014-07-27 LAB — HEMOGLOBIN A1C: Hgb A1c MFr Bld: 12.2 % — ABNORMAL HIGH (ref 4.6–6.5)

## 2014-07-27 LAB — LDL CHOLESTEROL, DIRECT: Direct LDL: 206 mg/dL

## 2014-07-27 NOTE — Telephone Encounter (Signed)
Message copied by Jinny Sanders on Tue Jul 27, 2014  7:34 AM ------      Message from: Ellamae Sia      Created: Wed Jul 21, 2014  3:00 PM      Regarding: Lab orders for Tuesday, 8.18.15       Lab orders for a f/u appt ------

## 2014-08-03 ENCOUNTER — Ambulatory Visit: Payer: 59 | Admitting: Family Medicine

## 2014-08-05 ENCOUNTER — Ambulatory Visit (INDEPENDENT_AMBULATORY_CARE_PROVIDER_SITE_OTHER): Payer: 59 | Admitting: Family Medicine

## 2014-08-05 ENCOUNTER — Encounter: Payer: Self-pay | Admitting: Family Medicine

## 2014-08-05 VITALS — BP 124/85 | HR 89 | Temp 98.0°F | Ht 59.5 in | Wt 154.2 lb

## 2014-08-05 DIAGNOSIS — E1129 Type 2 diabetes mellitus with other diabetic kidney complication: Secondary | ICD-10-CM

## 2014-08-05 DIAGNOSIS — E785 Hyperlipidemia, unspecified: Secondary | ICD-10-CM

## 2014-08-05 DIAGNOSIS — E039 Hypothyroidism, unspecified: Secondary | ICD-10-CM

## 2014-08-05 DIAGNOSIS — Z23 Encounter for immunization: Secondary | ICD-10-CM

## 2014-08-05 DIAGNOSIS — E1165 Type 2 diabetes mellitus with hyperglycemia: Principal | ICD-10-CM

## 2014-08-05 DIAGNOSIS — IMO0002 Reserved for concepts with insufficient information to code with codable children: Secondary | ICD-10-CM

## 2014-08-05 LAB — HM DIABETES FOOT EXAM

## 2014-08-05 NOTE — Patient Instructions (Addendum)
Get back on track with taking medication ie insulin and atorvastatin. Make sure the medications you have are taking are the same we have on the med list. Check fasting CBG daily and bring to first ENDO appt. Stop at front desk for endo referral for other DM med option.  Drink lots of fluids.

## 2014-08-05 NOTE — Assessment & Plan Note (Signed)
Stable control on current med. 

## 2014-08-05 NOTE — Assessment & Plan Note (Signed)
Very poor control recently non compliant with lantus. Recommended restarting at 45 units daily. Will not add additional med at this time given noncompliance and ? True CBG  Will ref to endo for other options as she does not like giving herself injecitons. ? inhalable insulin

## 2014-08-05 NOTE — Progress Notes (Signed)
53 year old female presents for 3 month follow up.   Relpax working better for migraines.   She has been very tired and thirsty lately.  Diabetes: Now  Significantly worsened control despite lantus 45 Units daily. She has NOT been compliant with her medication. She has been stressed out and her brother is ill. No steroids. She has cut  back on some [facility] to 2 ! " It has about killed me!"  She does not like to give her self injections. She wants a insulin pill. Lab Results  Component Value Date   HGBA1C 12.2* 07/27/2014  Using medications without difficulties:None... Wishes to change to other insulin to be able to give insulin in different locations than abdomen.  Hypoglycemic episodes: None  Hyperglycemic episodes:None  Feet problems: None  Blood Sugars averaging: not checking eye exam within last year:due this month   Elevated Cholesterol: Much worse control but was not takinglipitor 80 mg daily. NO improvement with addition of zetia. DID not like welchol and ineffective  Lab Results  Component Value Date   CHOL 255* 07/27/2014   HDL 37.60* 07/27/2014   LDLCALC 165* 04/14/2014   LDLDIRECT 206.0 07/27/2014   TRIG 247.0* 07/27/2014   CHOLHDL 7 07/27/2014  Muscle aches: None  Diet compliance:Cut out fast food. Trying to grill or bake now.  She is still drinking [employer].  Exercise:walking off and on.  Other complaints:   Hypertension: BP at goal on current regimen.  BP Readings from Last 3 Encounters:  08/05/14 124/85  06/04/14 100/66  04/27/14 102/70  Using medication without problems or lightheadedness: occ vertigo at times lasting 5 min, Usually with sitting to standing.  Chest pain with exertion:None  Edema:None  Short of breath:None  Average home BPs: at goal < 130/80    Other issues:  Review of Systems  Constitutional: Negative for fever and fatigue.  HENT: Negative for ear pain.  Eyes: Negative for pain.  Respiratory: Negative for chest tightness and  shortness of breath.  Cardiovascular: Negative for chest pain, palpitations and leg swelling.  Gastrointestinal: No abdominal pain, occ nausea.  Genitourinary: Negative for dysuria.  Objective:   Physical Exam  Constitutional: Vital signs are normal. She appears well-developed and well-nourished. She is cooperative. Non-toxic appearance. She does not appear ill. No distress.  HENT:  Head: Normocephalic.  Right Ear: Hearing, tympanic membrane, external ear and ear canal normal. Tympanic membrane is not erythematous, not retracted and not bulging.  Left Ear: Hearing, tympanic membrane, external ear and ear canal normal. Tympanic membrane is not erythematous, not retracted and not bulging.  Nose: No mucosal edema or rhinorrhea. Right sinus exhibits no maxillary sinus tenderness and no frontal sinus tenderness. Left sinus exhibits no maxillary sinus tenderness and no frontal sinus tenderness.  Mouth/Throat: Uvula is midline, oropharynx is clear and moist and mucous membranes are normal.  Eyes: Conjunctivae, EOM and lids are normal. Pupils are equal, round, and reactive to light. Lids are everted and swept, no foreign bodies found.  Neck: Trachea normal and normal range of motion. Neck supple. Carotid bruit is not present. No mass and no thyromegaly present.  Cardiovascular: Normal rate, regular rhythm, S1 normal, S2 normal, normal heart sounds, intact distal pulses and normal pulses. Exam reveals no gallop and no friction rub.  No murmur heard.  Pulmonary/Chest: Effort normal and breath sounds normal. Not tachypneic. No respiratory distress. She has no decreased breath sounds. She has no wheezes. She has no rhonchi. She has no rales.  Abdominal: Soft. Normal appearance and bowel sounds are normal. There is no hepatosplenomegaly. Nontender to palpation. There is no CVA tenderness.  Neurological: She is alert.  Skin: Skin is warm, dry and intact. No rash noted.  Psychiatric: Her speech is normal and  behavior is normal. Judgment and thought content normal. Her mood appears not anxious. Cognition and memory are normal. She does not exhibit a depressed mood.   Diabetic foot exam:  Normal inspection  No skin breakdown  No calluses  Normal DP pulses  Normal sensation to light touch and monofilament  Except numb i right 2nd digit Nails normal

## 2014-08-05 NOTE — Assessment & Plan Note (Signed)
Poor control. Recent poor complicance. She will start atorvastaitn 80 mg daily.

## 2014-08-05 NOTE — Progress Notes (Signed)
Pre visit review using our clinic review tool, if applicable. No additional management support is needed unless otherwise documented below in the visit note. 

## 2014-08-05 NOTE — Assessment & Plan Note (Signed)
Well controlled. Continue current medication.  

## 2014-08-11 ENCOUNTER — Encounter: Payer: Self-pay | Admitting: Endocrinology

## 2014-08-11 ENCOUNTER — Ambulatory Visit (INDEPENDENT_AMBULATORY_CARE_PROVIDER_SITE_OTHER): Payer: 59 | Admitting: Endocrinology

## 2014-08-11 VITALS — BP 120/70 | HR 106 | Ht 59.5 in | Wt 154.5 lb

## 2014-08-11 DIAGNOSIS — E1165 Type 2 diabetes mellitus with hyperglycemia: Principal | ICD-10-CM

## 2014-08-11 DIAGNOSIS — IMO0002 Reserved for concepts with insufficient information to code with codable children: Secondary | ICD-10-CM

## 2014-08-11 DIAGNOSIS — N182 Chronic kidney disease, stage 2 (mild): Secondary | ICD-10-CM

## 2014-08-11 DIAGNOSIS — E785 Hyperlipidemia, unspecified: Secondary | ICD-10-CM

## 2014-08-11 DIAGNOSIS — E1129 Type 2 diabetes mellitus with other diabetic kidney complication: Secondary | ICD-10-CM

## 2014-08-11 LAB — HM DIABETES FOOT EXAM: HM Diabetic Foot Exam: NORMAL

## 2014-08-11 MED ORDER — INSULIN PEN NEEDLE 32G X 4 MM MISC: Status: DC

## 2014-08-11 MED ORDER — INSULIN GLARGINE 100 UNIT/ML SOLOSTAR PEN: 48.0000 [IU] | PEN_INJECTOR | Freq: Every day | SUBCUTANEOUS | Status: DC

## 2014-08-11 MED ORDER — CANAGLIFLOZIN 100 MG PO TABS: 100.0000 mg | ORAL_TABLET | Freq: Every day | ORAL | Status: DC

## 2014-08-11 NOTE — Assessment & Plan Note (Signed)
Recently more compliant with Lipitor. Her levels are quite, uncontrolled. May need addition of Zetia if LDL stays, uncontrolled. This is being monitored by her primary care physician.

## 2014-08-11 NOTE — Progress Notes (Signed)
Reason for visit-  [name] [name] is a 53 y.o.-year-old female, referred by her PCP,  Bedsole, Amy E, MD for management of Type 2 diabetes, uncontrolled, with complications ( stage 2 CKD, neuropathy).   HPI- Patient has been diagnosed with diabetes in 2012. Didn't see nutrition at that time.  Wants to get back to taking better control of her sugars due to grandchild, and brother who is sick.  Tried Metformin and states that "it wasn't working". Probably affected her IBS symptoms as well, but doesn't recall specifically . Then tried glipizide and that " didn't work either".  Has been on insulin since 6 + months. Started on lantus, but hasn't been compliant till recently . Now trying to force herself to take the shot for the past week. Doesn't like the needles. Is looking for other options to take her insulin.  Has been on Novolog before but doesn't like the shots.   Pt is currently on a regimen of: - Lantus 45 units qhs   Last hemoglobin A1c was uncontrolled. She had been quite non compliant with her medications after January and has started getting back to taking these: Lab Results  Component Value Date   HGBA1C 12.2* 07/27/2014   HGBA1C 9.4* 04/14/2014   HGBA1C 7.1* 01/08/2014     Pt checks her sugars once a day . Uses Accuchek glucometer. By sugar log they are:  PREMEAL Breakfast Lunch Dinner Bedtime Overall  Glucose range: 247 - 308      Mean/median:        Hypoglycemia-  No lows recently.    Dietary habits- eats twice daily ( lunch and dinner) . Ham and chees sandwich, soup,pork chops, steak. Potatoes and gravy and rice mainly are her starch choices. Weakness is mountain dew - could drink 12 pack  A day on some days when she feels more thirsty. Trying to wean herself now trying to limit 2 cans. Trying to drink water and Sprite zero. Not a dessert person.     Exercise- walks daily , active around the house and  babysitting.  Weight - stable Wt Readings from Last 3 Encounters:   08/11/14 154 lb 8 oz (70.081 kg)  08/05/14 154 lb 4 oz (69.967 kg)  06/04/14 155 lb 8 oz (70.534 kg)    Diabetes Complications- Known complications Nephropathy- Yes  Stage 2 CKD, last BUN/creatinine- GFR 75. No recent urine MA. Lab Results  Component Value Date   BUN 11 07/27/2014   CREATININE 0.9 07/27/2014   Lab Results  Component Value Date   MICROALBUR 0.3 04/16/2011   Retinopathy- No, Last DEE was last year, developing cataracts Neuropathy- occ numbness and tingling in her feet. Known neuropathy.  Associated history - No history of CAD or prior stroke. Has hypothyroidism and is bring treated with levothyroxine by Her PCP. her last TSH was  Lab Results  Component Value Date   TSH 23.39* 10/13/2013   Lab Results  Component Value Date   FREET4 0.56* 07/27/2014    Hyperlipidemia-  her last set of lipids were- Currently on Lipitor and not controlled. Tolerating well. Recently compliant. Was also on zetia before. Being managed by her PCP.    Lab Results  Component Value Date   CHOL 255* 07/27/2014   HDL 37.60* 07/27/2014   LDLCALC 165* 04/14/2014   LDLDIRECT 206.0 07/27/2014   TRIG 247.0* 07/27/2014   CHOLHDL 7 07/27/2014    Blood Pressure- Patient's blood pressure is well controlled today.  Past Medical History  Diagnosis Date  . Osteoporosis   . Intermittent vertigo   . B12 deficiency   . Peripheral neuropathy   . GERD (gastroesophageal reflux disease)   . Anxiety   . Hyperlipidemia   . Hypothyroidism   . IBS (irritable bowel syndrome)   . Hypertension   . Leg cramping     "@ night" (09/18/2013)  . Type II diabetes mellitus   . Umbilical hernia     unrepaired (09/18/2013)  . Migraine     "once q couple months" (09/18/2013)  . 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 Cesarean section  1989  . Laparoscopic assisted vaginal hysterectomy  09/23/2012    Procedure: LAPAROSCOPIC ASSISTED  VAGINAL HYSTERECTOMY;  Surgeon: Linda Hedges, DO;  Location: [facility];  Service: Gynecology;  Laterality: N/A;  pull Dr Gregor Hams instrument  . Tubal ligation Bilateral 1992  . Vaginal hysterectomy  2013   History   Social History  . Marital Status: Married    Spouse Name: N/A    Number of Children: 2  . Years of Education: N/A   Occupational History  . UNEMPLOYED     homemaker   Social History Main Topics  . Smoking status: Former Smoker -- .2 years    Types: Cigarettes    Quit date: 12/10/1990  . Smokeless tobacco: Never Used  . Alcohol Use: No  . Drug Use: No  . Sexual Activity: Yes   Other Topics Concern  . Not on file   Social History Narrative   Regular exercise- no    Diet- lots of mountain dew, limits fast foods    Current Outpatient Prescriptions on File Prior to Visit  Medication Sig Dispense Refill  . atorvastatin (LIPITOR) 80 MG tablet Take 1 tablet (80 mg total) by mouth daily.  30 tablet  11  . Benzocaine-Resorcinol 20-3 % CREA Apply as directed daily  28 g  1  . levothyroxine (SYNTHROID, LEVOTHROID) 200 MCG tablet Take 1 tablet (200 mcg total) by mouth daily before breakfast.  90 tablet  3  . levothyroxine (SYNTHROID, LEVOTHROID) 25 MCG tablet Take 1 tablet (25 mcg total) by mouth daily before breakfast.  90 tablet  3  . ondansetron (ZOFRAN ODT) 8 MG disintegrating tablet Take 1 tablet (8 mg total) by mouth every 8 (eight) hours as needed for nausea or vomiting.  30 tablet  0  . pentafluoroprop-tetrafluoroeth (GEBAUERS) AERO Apply 1 application topically as needed.  103.5 mL  0  . RELPAX 40 MG tablet TAKE 1TAB AT ONSET OF HEADACHE/MIGRANE*MAY REPEAT IN 2 HRS IF PERSISTS OR RECURS*  10 tablet  0   No current facility-administered medications on file prior to visit.   Allergies  Allergen Reactions  . Codeine Hives, Anxiety and Other (See Comments)    REACTION: N \\T \ V and hyper. Pt can take Percocet.   Family History  Problem Relation Age of Onset  . Heart  failure Mother   . Diabetes Maternal Grandmother   . Alzheimer's disease Maternal Grandmother   . Coronary artery disease Maternal Grandmother   . Heart attack Maternal Grandfather   . Diabetes Other   . Heart disease Other       Pt has FH of DM in aunts and GM.  I have reviewed the patient's past medical history, family and social history, surgical history, medications and allergies.    ROS: Review of Systems: [x]  complains of  [  ]  denies General:   [  ] Recent weight change [ x ] Fatigue  [  ] Loss of appetite Eyes: [  ]  Vision Difficulty [  ]  Eye pain ENT: [  ]  Hearing difficulty [  ]  Difficulty Swallowing CVS: [  ] Chest pain [  ]  Palpitations/Irregular Heart beat [  ]  Shortness of breath lying flat [  ] Swelling of legs Resp: [  ] Frequent Cough [  ] Shortness of Breath  [  ]  Wheezing GI: [  ] Heartburn  [  ] Nausea or Vomiting  [  ] Diarrhea [  ] Constipation  [  ] Abdominal Pain GU: [  ]  Polyuria  [x  ]  nocturia Bones/joints:  [  ]  Muscle aches  [  ] Joint Pain  [  ] Bone pain Skin/Hair/Nails: [  ]  Rash  [  ] New stretch marks [  ]  Itching [  ] Hair loss [  ]  Excessive hair growth Reproduction: [  ] Low sexual desire , [  ]  Women: Menstrual cycle problems [  ]  Women: Breast Discharge [  ] Men: Difficulty with erections [  ]  Men: Enlarged Breasts CNS: [x  ] Frequent Headaches [  ] Blurry vision [  ] Tremors [  ] Seizures [  ] Loss of consciousness [  ] Localized weakness Endocrine: [  ]  Excess thirst [  ]  Feeling excessively hot [  ]  Feeling excessively cold Heme: [x  ]  Easy bruising [  ]  Enlarged glands or lumps in neck Allergy: [  ]  Food allergies [  ] Environmental allergies  PE: BP 120/70  Pulse 106  Ht 4' 11.5" (1.511 m)  Wt 154 lb 8 oz (70.081 kg)  BMI 30.70 kg/m2  SpO2 96%  LMP 08/11/2012 Wt Readings from Last 3 Encounters:  08/11/14 154 lb 8 oz (70.081 kg)  08/05/14 154 lb 4 oz (69.967 kg)  06/04/14 155 lb 8 oz (70.534 kg)   GENERAL:  No acute distress, well developed HEENT:  Eye exam shows normal external appearance. Oral exam shows normal mucosa .  NECK:   Neck exam shows no lymphadenopathy. No Carotids bruits. Thyroid is not enlarged and no nodules felt.   LUNGS:         Chest is symmetrical. Lungs are clear to auscultation.Marland Kitchen   HEART:         Heart sounds:  S1 and S2 are normal. No murmurs or clicks heard. ABDOMEN:  No Distention present. Liver and spleen are not palpable. No other mass or tenderness present.  EXTREMITIES:     There is no edema. 2+ DP pulses  NEUROLOGICAL:     Grossly intact.            Diabetic foot exam done with shoes and socks removed: Normal Monofilament testing bilaterally. No toe deformity.  Nails  Not dystrophic. Skin normal color. No open wounds. Dry skin.  MUSCULOSKELETAL:       There is no enlargement or gross deformity of the joints.  SKIN:       No rash  ASSESSMENT AND PLAN: Problem List Items Addressed This Visit     Endocrine   Type 2 diabetes mellitus, uncontrolled, with renal complications - Primary     Recent A1c, and blood sugar levels are poorly controlled. We discussed at length regarding home glucose monitoring,  as well as complications from uncontrolled hyperglycemia, and various treatment options. Discussed in detail about insulin injection technique, and various insulin treatments, including inhaled insulin. The patient was recently been compliant with her Lantus insulin and we decided to change over to the pen form to see whether she likes this better. Perhaps this will improve her compliance. Increase Lantus to 48 units daily. Check sugars twice daily at various time points. Discussed about adding mealtime insulin that his inhaled since she does not want to add any more injectables. Discussed about adding, SGL T2 inhibitors, and potential side effects and the patient would like to try Invokana at this time. She is aware to stay hydrated with water. She will return for a GFR  check in one week. She will return for an appointment in 2 weeks, and if sugars are still elevated, then will consider adding inhaled insulin [would need spirometry first]. Not a smoker in the recent years. No known pulmonary issues.  Discussed about various dietary changes, and the patient will work on substituting her [employer] intake to either Sprite 0 or water. She will also be referred to nutrition for better meal planning.     Relevant Medications      LANTUS SOLOSTAR 100 UNIT/ML [place] SOLN      Canagliflozin (INVOKANA) 100 MG TABS   Other Relevant Orders      Basic metabolic panel      Ambulatory referral to diabetic education     Genitourinary   Kidney disease, chronic, stage 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)     GFR recently checked and stable at 75.        Other   HYPERLIPIDEMIA     Recently more compliant with Lipitor. Her levels are quite, uncontrolled. May need addition of Zetia if LDL stays, uncontrolled. This is being monitored by her primary care physician.        PLAN:    - Return to clinic in  2 weeks with sugar log/meter.  Rosan Calbert [facility] 08/11/2014 2:03 PM

## 2014-08-11 NOTE — Patient Instructions (Signed)
  Check sugars twice daily ( fasting and premeals at alternating sites).  Bring meter or log to next appointment.  Change lantus to 48 units at night time. Switch to pen form to improve user friendliness. Start Invokana 100mg  daily .  Check labs in 1 week to check kidney function while on this medication.   Refer to nutrition. Cut out Sodas . Please come back for a follow-up appointment in 2 weeks

## 2014-08-11 NOTE — Assessment & Plan Note (Signed)
GFR recently checked and stable at 75.

## 2014-08-11 NOTE — Assessment & Plan Note (Addendum)
Recent A1c, and blood sugar levels are poorly controlled. We discussed at length regarding home glucose monitoring, as well as complications from uncontrolled hyperglycemia, and various treatment options. Discussed in detail about insulin injection technique, and various insulin treatments, including inhaled insulin. The patient was recently been compliant with her Lantus insulin and we decided to change over to the pen form to see whether she likes this better. Perhaps this will improve her compliance. Increase Lantus to 48 units daily. Check sugars twice daily at various time points. Discussed about adding mealtime insulin that his inhaled since she does not want to add any more injectables. Discussed about adding, SGL T2 inhibitors, and potential side effects and the patient would like to try Invokana at this time. She is aware to stay hydrated with water. She will return for a GFR check in one week. She will return for an appointment in 2 weeks, and if sugars are still elevated, then will consider adding inhaled insulin [would need spirometry first]. Not a smoker in the recent years. No known pulmonary issues.  Discussed about various dietary changes, and the patient will work on substituting her [employer]gate intake to either Sprite 0 or water. She will also be referred to nutrition for better meal planning.

## 2014-08-11 NOTE — Progress Notes (Signed)
Pre visit review using our clinic review tool, if applicable. No additional management support is needed unless otherwise documented below in the visit note. 

## 2014-08-18 ENCOUNTER — Other Ambulatory Visit (INDEPENDENT_AMBULATORY_CARE_PROVIDER_SITE_OTHER): Payer: 59

## 2014-08-18 DIAGNOSIS — IMO0002 Reserved for concepts with insufficient information to code with codable children: Secondary | ICD-10-CM

## 2014-08-18 DIAGNOSIS — E1165 Type 2 diabetes mellitus with hyperglycemia: Principal | ICD-10-CM

## 2014-08-18 DIAGNOSIS — E1129 Type 2 diabetes mellitus with other diabetic kidney complication: Secondary | ICD-10-CM

## 2014-08-18 LAB — BASIC METABOLIC PANEL
BUN: 13 mg/dL (ref 6–23)
CO2: 22 mEq/L (ref 19–32)
CREATININE: 0.7 mg/dL (ref 0.4–1.2)
Calcium: 9.3 mg/dL (ref 8.4–10.5)
Chloride: 102 mEq/L (ref 96–112)
GFR: 89.28 mL/min (ref >60.00)
GLUCOSE: 232 mg/dL — AB (ref 70–99)
POTASSIUM: 4.2 meq/L (ref 3.5–5.1)
Sodium: 134 mEq/L — ABNORMAL LOW (ref 135–145)

## 2014-08-19 ENCOUNTER — Encounter: Payer: Self-pay | Admitting: *Deleted

## 2014-08-24 ENCOUNTER — Encounter: Payer: 59 | Attending: Endocrinology | Admitting: Nutrition

## 2014-08-24 VITALS — Wt 152.4 lb

## 2014-08-24 DIAGNOSIS — Z713 Dietary counseling and surveillance: Secondary | ICD-10-CM | POA: Diagnosis not present

## 2014-08-24 DIAGNOSIS — E1165 Type 2 diabetes mellitus with hyperglycemia: Secondary | ICD-10-CM

## 2014-08-24 DIAGNOSIS — IMO0002 Reserved for concepts with insufficient information to code with codable children: Secondary | ICD-10-CM

## 2014-08-24 DIAGNOSIS — E1129 Type 2 diabetes mellitus with other diabetic kidney complication: Secondary | ICD-10-CM

## 2014-08-24 DIAGNOSIS — Z794 Long term (current) use of insulin: Secondary | ICD-10-CM | POA: Diagnosis not present

## 2014-08-24 DIAGNOSIS — E119 Type 2 diabetes mellitus without complications: Secondary | ICD-10-CM | POA: Diagnosis present

## 2014-08-24 NOTE — Progress Notes (Signed)
This patient is here today to review diet and blood sugars.   She did not bring her meter, but said that her blood sugar this AM was 154--"a lot better than the 200s she was running 2 weeks ago".    Her meals are balanced, but continues to drink regular soda some of the time.   Bfast:  9AM:  Glucerna drink            11AM:  Fruit-ususally an orange or small apple Lunch:12PM:  1/2 sandwich, with a "few chips, and sometimes a regular soda, but is trying Sprite 0 now "Most of the time"             3PM: piece of fruit Supper  6PM: 3-4 ounces of meat 1-2 servings of starchy veg. (cream potatoes, cream corn or peas)  Eats no other vegetables, or salads.  No bread.  Is trying to drink a Sprite O, but occasionally had a regular Mt. Dew.   HS snack:  Denies this  Insulin:  Lantus 9PM  48u Lantus  She reports that she is taking this every night now.  Stressed the need to do this, and instructed her that this is half of her daily dose, and that her body needs this to fuction every day.  She agreed to take this and realizes that she needs this.    Activity:  Says she is very active all day at home--never sitting down all day.  She admits to walking some maybe 10 min. Around the block 1-2 times per week.   She requested to learn to carb count, and so she was given a list of all of the carb servings of 15 grams, and was told to eat 15-30 grams at each meal, with a mid morning and afternoon snack of  15 grams.  She reported good understanding of this.   I also stressed the need to stop all sweet drinks.  Explained that these drinks will cause her sugar to rise 200 points, and make her insulin less effective.  She agreed to try to eliminate all sweet drinks.  Suggestions were given for other drinks to try beside diet Mt Dew--like Crystal Lite lemonade, or Diet koolaid.  She agreed to try them.

## 2014-08-24 NOTE — Patient Instructions (Signed)
Stop all sweet drinks. Limit meals to 15-30 grams of carbohydrate at each meal. Limit mid meal snacks to no more than 15 grams. Test blood sugar every other day: both before a meal, and 2hr. After that meal--varying the meal each time. Try to walk for 20 min. Every day.

## 2014-08-25 ENCOUNTER — Ambulatory Visit: Payer: 59 | Admitting: Endocrinology

## 2014-08-27 ENCOUNTER — Other Ambulatory Visit: Payer: Self-pay | Admitting: Family Medicine

## 2014-09-01 ENCOUNTER — Ambulatory Visit (INDEPENDENT_AMBULATORY_CARE_PROVIDER_SITE_OTHER): Payer: 59 | Admitting: Endocrinology

## 2014-09-01 ENCOUNTER — Encounter: Payer: Self-pay | Admitting: Endocrinology

## 2014-09-01 VITALS — BP 106/66 | HR 98 | Ht 59.5 in | Wt 151.5 lb

## 2014-09-01 DIAGNOSIS — E1165 Type 2 diabetes mellitus with hyperglycemia: Secondary | ICD-10-CM

## 2014-09-01 DIAGNOSIS — IMO0002 Reserved for concepts with insufficient information to code with codable children: Secondary | ICD-10-CM

## 2014-09-01 DIAGNOSIS — I10 Essential (primary) hypertension: Secondary | ICD-10-CM

## 2014-09-01 DIAGNOSIS — N182 Chronic kidney disease, stage 2 (mild): Secondary | ICD-10-CM

## 2014-09-01 DIAGNOSIS — E1129 Type 2 diabetes mellitus with other diabetic kidney complication: Secondary | ICD-10-CM

## 2014-09-01 NOTE — Patient Instructions (Addendum)
Check sugars 3 times daily ( fasting and premeal readings at alternating times, or at bedtime).  Record them in a sugar log and bring log and meter to next appointment.   Increase lantus to 52 units daily.  Report back if start to have lows.  Continue Invokana 100 mg daily.   Stay hydrated.  Watch carbs, portion sizes and diet carefully.  Incorporate exercise into schedule daily.  Please come back for a follow-up appointment in 8 weeks

## 2014-09-01 NOTE — Progress Notes (Signed)
Reason for visit-  [name] [name] is a 53 y.o.-year-old female, here for management of Type 2 diabetes, uncontrolled, with complications ( stage 2 CKD, neuropathy).   HPI- Diagnosed with diabetes in 2012.    -Tried Metformin and states that "it wasn't working". Probably affected her IBS symptoms as well, but doesn't recall specifically .  -Then tried glipizide and that " didn't work either".  -Has been on insulin since 6 + months. -Started on lantus, but hasn't been compliant till recently . Now trying to force herself to take the shot for the past few weeks. Doesn't like the needles. Is looking for other options to take her insulin.  - Has been on Novolog before but doesn't like the shots.   Pt is currently on a regimen of: - Lantus 48 units qhs - Invokana 100 mg daily ( start 08/2014)   Last hemoglobin A1c was uncontrolled. She had been quite non compliant with her medications after January and has recently improved compliance: Lab Results  Component Value Date   HGBA1C 12.2* 07/27/2014   HGBA1C 9.4* 04/14/2014   HGBA1C 7.1* 01/08/2014     Pt checks her sugars 2 x a day . Uses One Touch ultra 2 glucometer. By sugar log they are improving: She hasn't checked as much recently, as was helping relative in the hospital.  PREMEAL Breakfast Lunch Dinner Bedtime Overall  Glucose range: 113-234 166-295 150-211  194  Mean/median:        Hypoglycemia-  No lows recently.    Dietary habits- eats twice daily ( lunch and dinner) .  Weakness is mountain dew - could drink 12 pack  A day on some days when she feels more thirsty. Now taken herself off the sodas most days, Trying to drink water and Sprite zero.    Exercise- walks every other day- 20 minutes , active around the house and  babysitting.  Weight - downtrending Wt Readings from Last 3 Encounters:  09/01/14 151 lb 8 oz (68.72 kg)  08/24/14 152 lb 6.4 oz (69.128 kg)  08/11/14 154 lb 8 oz (70.081 kg)    Diabetes Complications- Known  complications Nephropathy- Yes  Stage 2 CKD, last BUN/creatinine- GFR 89. No recent urine MA. Lab Results  Component Value Date   BUN 13 08/18/2014   CREATININE 0.7 08/18/2014   Lab Results  Component Value Date   MICROALBUR 0.3 04/16/2011   Retinopathy- No, Last DEE was last year, developing cataracts Neuropathy- occ numbness and tingling in her feet. Known neuropathy.  Associated history - No history of CAD or prior stroke. Has hypothyroidism and is bring treated with levothyroxine by Her PCP. her last TSH was  Lab Results  Component Value Date   TSH 23.39* 10/13/2013   Lab Results  Component Value Date   FREET4 0.56* 07/27/2014    Hyperlipidemia-  her last set of lipids were- Currently on Lipitor and not controlled. Tolerating well. Recently compliant. Was also on zetia before. Being managed by her PCP.    Lab Results  Component Value Date   CHOL 255* 07/27/2014   HDL 37.60* 07/27/2014   LDLCALC 165* 04/14/2014   LDLDIRECT 206.0 07/27/2014   TRIG 247.0* 07/27/2014   CHOLHDL 7 07/27/2014    Blood Pressure- Patient's blood pressure is well controlled today.  Reviewed past medical history, medications and allergies.   Current Outpatient Prescriptions on File Prior to Visit  Medication Sig Dispense Refill  . atorvastatin (LIPITOR) 80 MG tablet Take 1 tablet (80 mg  total) by mouth daily.  30 tablet  11  . Benzocaine-Resorcinol 20-3 % CREA Apply as directed daily  28 g  1  . Canagliflozin (INVOKANA) 100 MG TABS Take 1 tablet (100 mg total) by mouth daily with breakfast.  30 tablet  6  . glucose blood (ONE TOUCH ULTRA TEST) test strip Use to check blood sugars two times a day.  Dx: 250.42  100 each  5  . Insulin Glargine (LANTUS SOLOSTAR) 100 UNIT/ML Solostar Pen Inject 48 Units into the skin daily at 10 pm.  5 pen  PRN  . Insulin Pen Needle (BD PEN NEEDLE NANO U/F) 32G X 4 MM MISC Use for insulin injections once daily.  30 each  3  . levothyroxine (SYNTHROID, LEVOTHROID) 200 MCG tablet  Take 1 tablet (200 mcg total) by mouth daily before breakfast.  90 tablet  3  . levothyroxine (SYNTHROID, LEVOTHROID) 25 MCG tablet Take 1 tablet (25 mcg total) by mouth daily before breakfast.  90 tablet  3  . ondansetron (ZOFRAN ODT) 8 MG disintegrating tablet Take 1 tablet (8 mg total) by mouth every 8 (eight) hours as needed for nausea or vomiting.  30 tablet  0  . pentafluoroprop-tetrafluoroeth (GEBAUERS) AERO Apply 1 application topically as needed.  103.5 mL  0  . RELPAX 40 MG tablet TAKE 1TAB AT ONSET OF HEADACHE/MIGRANE*MAY REPEAT IN 2 HRS IF PERSISTS OR RECURS*  10 tablet  0   No current facility-administered medications on file prior to visit.   Allergies  Allergen Reactions  . Codeine Hives, Anxiety and Other (See Comments)    REACTION: N \\T \ V and hyper. Pt can take Percocet.     Review of Systems- [ x ]  Complains of    [  ]  denies [  ] Recent weight change [  ]  Fatigue [  ] polydipsia [  ] polyuria [  ]  nocturia [  ]  vision difficulty [  ] chest pain [  ] shortness of breath [  ] leg swelling [  ] cough [  ] nausea/vomiting [  ] diarrhea [  ] constipation [  ] abdominal pain [  ]  tingling/numbness in extremities [  ]  concern with feet ( wounds/sores)  PE: BP 106/66  Pulse 98  Ht 4' 11.5" (1.511 m)  Wt 151 lb 8 oz (68.72 kg)  BMI 30.10 kg/m2  SpO2 97%  LMP 08/11/2012 Wt Readings from Last 3 Encounters:  09/01/14 151 lb 8 oz (68.72 kg)  08/24/14 152 lb 6.4 oz (69.128 kg)  08/11/14 154 lb 8 oz (70.081 kg)   Exam: deferred  ASSESSMENT AND PLAN: 1. Type 2 Diabetes mellitus, uncontrolled 2. Stage 2 CKD 3. Hypertension  Problem List Items Addressed This Visit     Cardiovascular and Mediastinum   HYPERTENSION - Primary     BP well controlled today. Update urine MA at next visit when sugars are better.        Endocrine   Type 2 diabetes mellitus, uncontrolled, with renal complications     Recent A1c, and blood sugar levels are poorly controlled. There  has been some improvement with her sugars as compared to last visit.  We discussed about importance of taking her medications and following her dietary plan and she is agreeable to maintain adherence.   She wants to continue on current medications for now, rather than adding any other medication or even inhaled insulin at this stage.  Increase Lantus to 52 units daily. Check sugars twice-three daily at various time points.  She will also follow up with nutrition in few weeks time.  If she starts noticing lows or sugars stay elevated, then will plan to increase Invokana.  For now, she will continue current dose of Invokana 100 mg daily.   RTC 2 months.         Genitourinary   Kidney disease, chronic, stage 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)     GFR recently checked and stable.               - Return to clinic in  2 months with sugar log/meter.  Pavneet Markwood [facility] 09/01/2014 11:51 AM

## 2014-09-01 NOTE — Assessment & Plan Note (Signed)
GFR recently checked and stable.       

## 2014-09-01 NOTE — Assessment & Plan Note (Signed)
Recent A1c, and blood sugar levels are poorly controlled. There has been some improvement with her sugars as compared to last visit.  We discussed about importance of taking her medications and following her dietary plan and she is agreeable to maintain adherence.   She wants to continue on current medications for now, rather than adding any other medication or even inhaled insulin at this stage.   Increase Lantus to 52 units daily. Check sugars twice-three daily at various time points.  She will also follow up with nutrition in few weeks time.  If she starts noticing lows or sugars stay elevated, then will plan to increase Invokana.  For now, she will continue current dose of Invokana 100 mg daily.   RTC 2 months.

## 2014-09-01 NOTE — Progress Notes (Signed)
Pre visit review using our clinic review tool, if applicable. No additional management support is needed unless otherwise documented below in the visit note. 

## 2014-09-01 NOTE — Assessment & Plan Note (Signed)
BP well controlled today. Update urine MA at next visit when sugars are better.

## 2014-09-06 ENCOUNTER — Other Ambulatory Visit: Payer: Self-pay | Admitting: Family Medicine

## 2014-09-06 ENCOUNTER — Telehealth: Payer: Self-pay | Admitting: *Deleted

## 2014-09-06 DIAGNOSIS — E039 Hypothyroidism, unspecified: Secondary | ICD-10-CM

## 2014-09-06 NOTE — Telephone Encounter (Signed)
Fax from pharmacy, Invokana needing PA. Started online, pending approval

## 2014-09-06 NOTE — Telephone Encounter (Signed)
Fax from [place] approved through 09/07/19, pharmacy notified

## 2014-09-27 ENCOUNTER — Ambulatory Visit: Payer: 59 | Admitting: Nutrition

## 2014-09-28 ENCOUNTER — Other Ambulatory Visit: Payer: 59

## 2014-10-06 ENCOUNTER — Other Ambulatory Visit: Payer: Self-pay | Admitting: Family Medicine

## 2014-10-07 ENCOUNTER — Other Ambulatory Visit: Payer: Self-pay | Admitting: Family Medicine

## 2014-11-01 ENCOUNTER — Other Ambulatory Visit: Payer: 59

## 2014-11-02 ENCOUNTER — Ambulatory Visit: Payer: 59 | Admitting: Endocrinology

## 2014-11-08 ENCOUNTER — Encounter: Payer: 59 | Admitting: Family Medicine

## 2014-12-06 ENCOUNTER — Ambulatory Visit (INDEPENDENT_AMBULATORY_CARE_PROVIDER_SITE_OTHER): Payer: 59 | Admitting: Internal Medicine

## 2014-12-06 ENCOUNTER — Encounter: Payer: Self-pay | Admitting: Internal Medicine

## 2014-12-06 VITALS — BP 118/84 | HR 89 | Temp 97.8°F | Wt 143.0 lb

## 2014-12-06 DIAGNOSIS — S161XXA Strain of muscle, fascia and tendon at neck level, initial encounter: Secondary | ICD-10-CM

## 2014-12-06 MED ORDER — KETOROLAC TROMETHAMINE 30 MG/ML IJ SOLN
30.0000 mg | Freq: Once | INTRAMUSCULAR | Status: AC
  Administered 2014-12-06: 30 mg via INTRAMUSCULAR

## 2014-12-06 MED ORDER — CYCLOBENZAPRINE HCL 5 MG PO TABS
5.0000 mg | ORAL_TABLET | Freq: Three times a day (TID) | ORAL | Status: DC | PRN
Start: 2014-12-06 — End: 2015-01-10

## 2014-12-06 NOTE — Patient Instructions (Signed)

## 2014-12-06 NOTE — Progress Notes (Signed)
Pre visit review using our clinic review tool, if applicable. No additional management support is needed unless otherwise documented below in the visit note. 

## 2014-12-06 NOTE — Progress Notes (Signed)
Subjective:    Patient ID: [name] [name], female    DOB: 18-Jun-1961, 53 y.o.   MRN: [id number]  HPI  Pt presents to the clinic today with c/o neck pain. She thinks she may have pulled a muscle in her neck. She reports she had a knot on her right upper shoulder. The pain radiates up her neck and down her right arm. She describes the pain as stiffness. It is worse with certain movement. She denies numbness or tingling. She denies any specific injury to the area. She has tried icy hot, ibuprofen and a heating pad without any relief.   Review of Systems      Past Medical History  Diagnosis Date  . Osteoporosis   . Intermittent vertigo   . B12 deficiency   . Peripheral neuropathy   . GERD (gastroesophageal reflux disease)   . Anxiety   . Hyperlipidemia   . Hypothyroidism   . IBS (irritable bowel syndrome)   . Hypertension   . Leg cramping     "@ night" (09/18/2013)  . Type II diabetes mellitus   . Umbilical hernia     unrepaired (09/18/2013)  . Migraine     "once q couple months" (09/18/2013)  . Kidney disease, chronic, stage 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) 10/29/2013    Current Outpatient Prescriptions  Medication Sig Dispense Refill  . atorvastatin (LIPITOR) 80 MG tablet Take 1 tablet (80 mg total) by mouth daily. 30 tablet 11  . Benzocaine-Resorcinol 20-3 % CREA Apply as directed daily 28 g 1  . Canagliflozin (INVOKANA) 100 MG TABS Take 1 tablet (100 mg total) by mouth daily with breakfast. 30 tablet 6  . glucose blood (ONE TOUCH ULTRA TEST) test strip Use to check blood sugars two times a day.  Dx: 250.42 100 each 5  . Insulin Glargine (LANTUS SOLOSTAR) 100 UNIT/ML Solostar Pen Inject 48 Units into the skin daily at 10 pm. 5 pen PRN  . Insulin Pen Needle (BD PEN NEEDLE NANO U/F) 32G X 4 MM MISC Use for insulin injections once daily. 30 each 3  . levothyroxine (SYNTHROID, LEVOTHROID) 200 MCG tablet TAKE 1 TABLET BY MOUTH DAILY TAKE ALONG WITH THE 25 MG DOSE. 30 tablet 5  .  levothyroxine (SYNTHROID, LEVOTHROID) 25 MCG tablet Take 1 tablet (25 mcg total) by mouth daily before breakfast. 90 tablet 3  . ondansetron (ZOFRAN ODT) 8 MG disintegrating tablet Take 1 tablet (8 mg total) by mouth every 8 (eight) hours as needed for nausea or vomiting. 30 tablet 0  . pentafluoroprop-tetrafluoroeth (GEBAUERS) AERO Apply 1 application topically as needed. 103.5 mL 0  . RELPAX 40 MG tablet TAKE 1TAB AT ONSET OF HEADACHE/MIGRANE*MAY REPEAT IN 2 HRS IF PERSISTS OR RECURS* 10 tablet 0   No current facility-administered medications for this visit.    Allergies  Allergen Reactions  . Codeine Hives, Anxiety and Other (See Comments)    REACTION: N \\T \ V and hyper. Pt can take Percocet.    Family History  Problem Relation Age of Onset  . Heart failure Mother   . Diabetes Maternal Grandmother   . Alzheimer's disease Maternal Grandmother   . Coronary artery disease Maternal Grandmother   . Heart attack Maternal Grandfather   . Diabetes Other   . Heart disease Other     History   Social History  . Marital Status: Married    Spouse Name: N/A    Number of Children: 2  . Years of Education: N/A  Occupational History  . UNEMPLOYED     homemaker   Social History Main Topics  . Smoking status: Former Smoker -- .2 years    Types: Cigarettes    Quit date: 12/10/1990  . Smokeless tobacco: Never Used  . Alcohol Use: No  . Drug Use: No  . Sexual Activity: Yes   Other Topics Concern  . Not on file   Social History Narrative   Regular exercise- no    Diet- lots of mountain dew, limits fast foods      Constitutional: Denies fever, malaise, fatigue, headache or abrupt weight changes.  Musculoskeletal: Pt reports neck pain. Denies decrease in range of motion, difficulty with gait or joint pain and swelling. . Neurological: Denies dizziness, difficulty with memory, difficulty with speech or problems with balance and coordination.   No other specific complaints in a  complete review of systems (except as listed in HPI above).  Objective:   Physical Exam  BP 118/84 mmHg  Pulse 89  Temp(Src) 97.8 F (36.6 C) (Oral)  Wt 143 lb (64.864 kg)  SpO2 98%  LMP 08/11/2012 Wt Readings from Last 3 Encounters:  12/06/14 143 lb (64.864 kg)  09/01/14 151 lb 8 oz (68.72 kg)  08/24/14 152 lb 6.4 oz (69.128 kg)    General: Appears her stated age, in NAD. Skin: Warm, dry and intact. No rashes, lesions or ulcerations noted. Cardiovascular: Normal rate and rhythm. S1,S2 noted.  No murmur, rubs or gallops noted.  Pulmonary/Chest: Normal effort and positive vesicular breath sounds. No respiratory distress. No wheezes, rales or ronchi noted.  Musculoskeletal: Normal flexion, extension and lateral rotation to the left of the cervical spine. Decreased lateral rotation to the right. Pain with palpation of the para cervical muscles. More tense on the right. Normal internal and external rotation of the right shoulder. No pain with palpation of the shoulder. Strength 5/5 BUE. Neurological: Alert and oriented. Sensation intact to bilateral upper extremities.  BMET    Component Value Date/Time   NA 134* 08/18/2014 1153   K 4.2 08/18/2014 1153   CL 102 08/18/2014 1153   CO2 22 08/18/2014 1153   GLUCOSE 232* 08/18/2014 1153   BUN 13 08/18/2014 1153   CREATININE 0.7 08/18/2014 1153   CALCIUM 9.3 08/18/2014 1153   GFRNONAA >90 10/28/2013 1330   GFRAA >90 10/28/2013 1330    Lipid Panel     Component Value Date/Time   CHOL 255* 07/27/2014 0745   TRIG 247.0* 07/27/2014 0745   HDL 37.60* 07/27/2014 0745   CHOLHDL 7 07/27/2014 0745   VLDL 49.4* 07/27/2014 0745   LDLCALC 165* 04/14/2014 0840    CBC    Component Value Date/Time   WBC 11.0* 06/04/2014 1001   RBC 5.50* 06/04/2014 1001   HGB 15.8* 06/04/2014 1001   HCT 47.5* 06/04/2014 1001   PLT 308.0 06/04/2014 1001   MCV 86.5 06/04/2014 1001   MCH 27.0 10/29/2013 0451   MCHC 33.3 06/04/2014 1001   RDW 16.8*  06/04/2014 1001   LYMPHSABS 2.8 06/04/2014 1001   MONOABS 0.6 06/04/2014 1001   EOSABS 0.2 06/04/2014 1001   BASOSABS 0.0 06/04/2014 1001    Hgb A1C Lab Results  Component Value Date   HGBA1C 12.2* 07/27/2014         Assessment & Plan:   Muscle strain of neck:  Neck exercises given Continue icy hot and heating pad Stop ibuprofen due to kidney disease Try tylenol 30 mg Toradol IM given today eRx for flexeril 5  mg QHS to help you relax  RTC as needed or if symptoms persist or worsen

## 2014-12-06 NOTE — Addendum Note (Signed)
Addended by: Lurlean Nanny on: 12/06/2014 03:25 PM   Modules accepted: Orders

## 2014-12-07 ENCOUNTER — Telehealth: Payer: Self-pay

## 2014-12-07 NOTE — Telephone Encounter (Signed)
PLEASE NOTE: All timestamps contained within this report are represented as [place] Standard Time. CONFIDENTIALTY NOTICE: This fax transmission is intended only for the addressee. It contains information that is legally privileged, confidential or otherwise protected from use or disclosure. If you are not the intended recipient, you are strictly prohibited from reviewing, disclosing, copying using or disseminating any of this information or taking any action in reliance on or regarding this information. If you have received this fax in error, please notify us immediately by telephone so that we can arrange for its return to [place]. Phone: [phone], Toll-Free: [phone], Fax: [phone] Page: 1 of 2 Call Id: [id number] [place] Patient Name: [name] [name] Gender: Female DOB: 1961-07-08 Age: 53 Y 69 M 6 D Return Phone Number: [phone] (Primary) Address: [address] City/State/Zip: [place]  [address] Client [facility] - Client Client Site [facility] - Day Physician Diona Browner, [place] Contact Type Call Call Type Triage / Clinical Relationship To Patient Self Return Phone Number [phone] (Primary) Chief Complaint Neck Pain Initial Comment Caller states she thinks she may have pulled muscle in right side of neck, going into shoulder. PreDisposition Call Doctor Nurse Assessment Nurse: Kenton Kingfisher, RN, [place] Date/Time Eilene Ghazi Time): 12/06/2014 11:21:29 AM Confirm and document reason for call. If symptomatic, describe symptoms. ---Caller states she thinks she may have pulled muscle in right side of neck, going into shoulder. Caller states it had eased up but now its in her neck again and going down right shoulder and right arm and cannot sleep. Cannot put any pressure on it. Has used ice, heat, and icy hot rub, ibuprofen and nothing seems  to be helping. Caller states she woke up one morning with her neck stiff. Has the patient traveled out of the country within the last 30 days? ---Not Applicable Does the patient require triage? ---Yes Related visit to physician within the last 2 weeks? ---No Does the PT have any chronic conditions? (i.e. diabetes, asthma, etc.) ---Yes List chronic conditions. ---Diabetic thyroid high cholesterol Did the patient indicate they were pregnant? ---No Guidelines Guideline Title Affirmed Question Affirmed Notes Nurse Date/Time Eilene Ghazi Time) Neck Pain or Stiffness [1] SEVERE neck pain (e.g., excruciating, unable to do any normal activities) AND [2] not improved after 2 hours of pain medicine RIGHT SIDED PAIN 8/10 and Ibuprofen is not helping. Kenton Kingfisher, RN, Meagan 12/06/2014 11:23:44 AM Disp. Time Eilene Ghazi Time) Disposition Final User PLEASE NOTE: All timestamps contained within this report are represented as [place] Standard Time. CONFIDENTIALTY NOTICE: This fax transmission is intended only for the addressee. It contains information that is legally privileged, confidential or otherwise protected from use or disclosure. If you are not the intended recipient, you are strictly prohibited from reviewing, disclosing, copying using or disseminating any of this information or taking any action in reliance on or regarding this information. If you have received this fax in error, please notify us immediately by telephone so that we can arrange for its return to [place]. Phone: [phone], Toll-Free: [phone], Fax: [phone] Page: 2 of 2 Call Id: [id number] 12/06/2014 11:27:00 AM See Physician within 4 Hours (or PCP triage) Yes Kenton Kingfisher, RN, [place] Understands: Yes Disagree/Comply: Comply Care Advice Given Per Guideline SEE PHYSICIAN WITHIN 4 HOURS (or PCP triage): * IF NO PCP TRIAGE: You need to be seen. Go to _______________ (ED/ [facility] or office if it will be open) within the next 3  or  4 hours. Go sooner if you become worse. PAIN MEDICINES: IBUPROFEN (E.G., MOTRIN, ADVIL): * Take 400 mg (two 200 mg pills) by mouth every 6 hours as needed. * Another choice is to take 600 mg (three 200 mg pills) by mouth every 8 hours as needed. * The most you should take each day is 1,200 mg (six 200 mg pills a day), unless your doctor has told you to take more. CALL BACK IF: * You become worse. CARE ADVICE given per Neck Pain (Adult) guideline. After Care Instructions Given Call Event Type User Date / Time Description Comments User: Dayton Martes, RN Date/Time Eilene Ghazi Time): 12/06/2014 11:32:14 AM [phone] Choose option 8) Called to see if caller can get in to see her MD. [name] [name] states she wants to see Dr. Diona Browner if possible. Spoke with Ebony Hail and transferred to Nurse [place] caller warm transferred to speak with nurse and can see [name] Po, NP at 3:00 p.m. Referrals REFERRED TO PCP OFFICE

## 2014-12-07 NOTE — Telephone Encounter (Signed)
Pt was seen 12/06/14.

## 2014-12-21 ENCOUNTER — Telehealth: Payer: Self-pay

## 2014-12-21 ENCOUNTER — Ambulatory Visit: Payer: 59 | Admitting: Endocrinology

## 2014-12-21 NOTE — Telephone Encounter (Signed)
Tried calling pt to inquire about missed apt (and resch), no answer, lvmom.

## 2014-12-22 NOTE — Telephone Encounter (Signed)
Thanks for doing this. If no answer, best to send out a letter asking her to make fu appt.

## 2015-01-03 ENCOUNTER — Telehealth: Payer: Self-pay | Admitting: Family Medicine

## 2015-01-03 DIAGNOSIS — E1129 Type 2 diabetes mellitus with other diabetic kidney complication: Secondary | ICD-10-CM

## 2015-01-03 DIAGNOSIS — M810 Age-related osteoporosis without current pathological fracture: Secondary | ICD-10-CM

## 2015-01-03 DIAGNOSIS — E559 Vitamin D deficiency, unspecified: Secondary | ICD-10-CM

## 2015-01-03 DIAGNOSIS — D72829 Elevated white blood cell count, unspecified: Secondary | ICD-10-CM

## 2015-01-03 DIAGNOSIS — E538 Deficiency of other specified B group vitamins: Secondary | ICD-10-CM

## 2015-01-03 DIAGNOSIS — E1165 Type 2 diabetes mellitus with hyperglycemia: Principal | ICD-10-CM

## 2015-01-03 DIAGNOSIS — E785 Hyperlipidemia, unspecified: Secondary | ICD-10-CM

## 2015-01-03 DIAGNOSIS — IMO0002 Reserved for concepts with insufficient information to code with codable children: Secondary | ICD-10-CM

## 2015-01-03 NOTE — Telephone Encounter (Signed)
-----   Message from Ellamae Sia sent at 12/30/2014  5:22 PM EST ----- Regarding: Lab orders for Tuesday, 1.26.16 Patient is scheduled for CPX labs, please order future labs, Thanks , Terri  Already has thyroid tests orders

## 2015-01-04 ENCOUNTER — Other Ambulatory Visit (INDEPENDENT_AMBULATORY_CARE_PROVIDER_SITE_OTHER): Payer: 59

## 2015-01-04 DIAGNOSIS — E785 Hyperlipidemia, unspecified: Secondary | ICD-10-CM

## 2015-01-04 DIAGNOSIS — IMO0002 Reserved for concepts with insufficient information to code with codable children: Secondary | ICD-10-CM

## 2015-01-04 DIAGNOSIS — E1129 Type 2 diabetes mellitus with other diabetic kidney complication: Secondary | ICD-10-CM

## 2015-01-04 DIAGNOSIS — D72829 Elevated white blood cell count, unspecified: Secondary | ICD-10-CM

## 2015-01-04 DIAGNOSIS — M810 Age-related osteoporosis without current pathological fracture: Secondary | ICD-10-CM

## 2015-01-04 DIAGNOSIS — E038 Other specified hypothyroidism: Secondary | ICD-10-CM

## 2015-01-04 DIAGNOSIS — E039 Hypothyroidism, unspecified: Secondary | ICD-10-CM

## 2015-01-04 DIAGNOSIS — E538 Deficiency of other specified B group vitamins: Secondary | ICD-10-CM

## 2015-01-04 DIAGNOSIS — E1165 Type 2 diabetes mellitus with hyperglycemia: Secondary | ICD-10-CM

## 2015-01-04 LAB — LIPID PANEL
CHOL/HDL RATIO: 5
CHOLESTEROL: 213 mg/dL — AB (ref 0–200)
HDL: 44.2 mg/dL (ref >39.00)
LDL CALC: 129 mg/dL — AB (ref 0–99)
NonHDL: 168.8
Triglycerides: 199 mg/dL — ABNORMAL HIGH (ref 0.0–149.0)
VLDL: 39.8 mg/dL (ref 0.0–40.0)

## 2015-01-04 LAB — CBC WITH DIFFERENTIAL/PLATELET
BASOS ABS: 0 10*3/uL (ref 0.0–0.1)
BASOS PCT: 0.4 % (ref 0.0–3.0)
EOS PCT: 1.7 % (ref 0.0–5.0)
Eosinophils Absolute: 0.2 10*3/uL (ref 0.0–0.7)
HCT: 47.6 % — ABNORMAL HIGH (ref 36.0–46.0)
Hemoglobin: 16.3 g/dL — ABNORMAL HIGH (ref 12.0–15.0)
Lymphocytes Relative: 27.8 % (ref 12.0–46.0)
Lymphs Abs: 3.2 10*3/uL (ref 0.7–4.0)
MCHC: 34.2 g/dL (ref 30.0–36.0)
MCV: 81.7 fl (ref 78.0–100.0)
Monocytes Absolute: 0.5 10*3/uL (ref 0.1–1.0)
Monocytes Relative: 4.3 % (ref 3.0–12.0)
Neutro Abs: 7.6 10*3/uL (ref 1.4–7.7)
Neutrophils Relative %: 65.8 % (ref 43.0–77.0)
Platelets: 298 10*3/uL (ref 150.0–400.0)
RBC: 5.83 Mil/uL — AB (ref 3.87–5.11)
RDW: 16.7 % — ABNORMAL HIGH (ref 11.5–15.5)
WBC: 11.5 10*3/uL — ABNORMAL HIGH (ref 4.0–10.5)

## 2015-01-04 LAB — COMPREHENSIVE METABOLIC PANEL
ALT: 8 U/L (ref 0–35)
AST: 10 U/L (ref 0–37)
Albumin: 4.1 g/dL (ref 3.5–5.2)
Alkaline Phosphatase: 121 U/L — ABNORMAL HIGH (ref 39–117)
BILIRUBIN TOTAL: 0.4 mg/dL (ref 0.2–1.2)
BUN: 12 mg/dL (ref 6–23)
CALCIUM: 9.1 mg/dL (ref 8.4–10.5)
CO2: 19 mEq/L (ref 19–32)
Chloride: 101 mEq/L (ref 96–112)
Creatinine, Ser: 0.78 mg/dL (ref 0.40–1.20)
GFR: 82.59 mL/min (ref >60.00)
Glucose, Bld: 450 mg/dL — ABNORMAL HIGH (ref 70–99)
Potassium: 4.7 mEq/L (ref 3.5–5.1)
SODIUM: 133 meq/L — AB (ref 135–145)
TOTAL PROTEIN: 6.9 g/dL (ref 6.0–8.3)

## 2015-01-04 LAB — TSH: TSH: 13.32 u[IU]/mL — ABNORMAL HIGH (ref 0.35–4.50)

## 2015-01-04 LAB — T4, FREE: Free T4: 0.57 ng/dL — ABNORMAL LOW (ref 0.60–1.60)

## 2015-01-04 LAB — VITAMIN B12: Vitamin B-12: 285 pg/mL (ref 211–911)

## 2015-01-04 LAB — T3, FREE: T3, Free: 3 pg/mL (ref 2.3–4.2)

## 2015-01-04 LAB — VITAMIN D 25 HYDROXY (VIT D DEFICIENCY, FRACTURES): VITD: 11.82 ng/mL — ABNORMAL LOW (ref 30.00–100.00)

## 2015-01-04 LAB — HEMOGLOBIN A1C: Hgb A1c MFr Bld: 12.8 % — ABNORMAL HIGH (ref 4.6–6.5)

## 2015-01-06 ENCOUNTER — Ambulatory Visit (INDEPENDENT_AMBULATORY_CARE_PROVIDER_SITE_OTHER): Payer: 59 | Admitting: Endocrinology

## 2015-01-06 VITALS — BP 110/72 | HR 93 | Ht 59.5 in | Wt 149.2 lb

## 2015-01-06 DIAGNOSIS — N182 Chronic kidney disease, stage 2 (mild): Secondary | ICD-10-CM

## 2015-01-06 DIAGNOSIS — E1165 Type 2 diabetes mellitus with hyperglycemia: Secondary | ICD-10-CM

## 2015-01-06 DIAGNOSIS — I10 Essential (primary) hypertension: Secondary | ICD-10-CM

## 2015-01-06 DIAGNOSIS — IMO0002 Reserved for concepts with insufficient information to code with codable children: Secondary | ICD-10-CM

## 2015-01-06 DIAGNOSIS — E1129 Type 2 diabetes mellitus with other diabetic kidney complication: Secondary | ICD-10-CM

## 2015-01-06 DIAGNOSIS — E785 Hyperlipidemia, unspecified: Secondary | ICD-10-CM

## 2015-01-06 DIAGNOSIS — E039 Hypothyroidism, unspecified: Secondary | ICD-10-CM

## 2015-01-06 MED ORDER — INSULIN GLARGINE 100 UNIT/ML SOLOSTAR PEN: 52.0000 [IU] | PEN_INJECTOR | Freq: Every day | SUBCUTANEOUS | Status: DC

## 2015-01-06 NOTE — Assessment & Plan Note (Signed)
Discussed importance of taking her thyroid medication as directed. Recent non compliance to medication. Asked her to continue current dose of levothyroxine for now and will recheck in 6 weeks.

## 2015-01-06 NOTE — Assessment & Plan Note (Signed)
GFR recently checked and stable.

## 2015-01-06 NOTE — Patient Instructions (Signed)
Check sugars 2 x daily ( before breakfast and before supper).  Record them in a log book and bring that/meter to next appointment.   Start taking the lantus at 52 units daily.  Refer to [facility] for [place] training.   Take your thyroid medication daily as directed.   Please come back for a follow-up appointment in 2 weeks

## 2015-01-06 NOTE — Progress Notes (Signed)
Reason for visit-  [name] [name] is a 54 y.o.-year-old female, here for management of Type 2 diabetes, uncontrolled, with complications ( stage 2 CKD, neuropathy). Last visit with me Sept 2015.   HPI- Diagnosed with diabetes in 2012.    -Tried Metformin and states that "it wasn't working". Probably affected her IBS symptoms as well, but doesn't recall specifically .  -Then tried glipizide and that " didn't work either".  -Has been on insulin since 6 + months ~2015. -Started on lantus, but having problems with compliance as having to force herself to take the shot. Doesn't like injectable therapy. Trying to get her husband to give her the injections.  - Has been on Novolog before but doesn't like the shots.   Pt is currently on a regimen of: - Lantus 52 units qhs -- stopped taking it for the last few months- as started feeling better and didn't need it - Invokana 100 mg daily ( start 08/2014)   Last hemoglobin A1c was uncontrolled. She had been quite non compliant with her medications after last visit : Lab Results  Component Value Date   HGBA1C 12.8* 01/04/2015   HGBA1C 12.2* 07/27/2014   HGBA1C 9.4* 04/14/2014     Pt checks her sugars 0 x a day . Uses One Touch ultra 2 glucometer. By recall, she checked once a week ago and sugar was 499.  PREMEAL Breakfast Lunch Dinner Bedtime Overall  Glucose range:       Mean/median:        Hypoglycemia-  No lows recently.    Dietary habits- eats twice daily ( lunch and dinner) .  Weakness is mountain dew - used to drink 12 pack  A day on some days when she feels more thirsty. Now down to 3 a day.  Now trying to drink lemon water.    Exercise- walks every other day- 20 minutes , active around the house and  babysitting.  Weight - downtrending Wt Readings from Last 3 Encounters:  01/06/15 149 lb 4 oz (67.699 kg)  12/06/14 143 lb (64.864 kg)  09/01/14 151 lb 8 oz (68.72 kg)    Diabetes Complications- Known complications Nephropathy-  Yes  Stage 2 CKD, last BUN/creatinine- No recent urine MA. Lab Results  Component Value Date   BUN 12 01/04/2015   CREATININE 0.78 01/04/2015   Lab Results  Component Value Date   MICROALBUR 0.3 04/16/2011   Lab Results  Component Value Date   GFR 82.59 01/04/2015    Retinopathy- No, Last DEE was last year, developing cataracts-" eye doctor wants to see her when her sugars are better" Neuropathy- occ numbness and tingling in her feet. Known neuropathy.  Associated history - No history of CAD or prior stroke. Has hypothyroidism and is bring treated with levothyroxine by Her PCP. She was taking it on and off since last visit. her last TSH was  Lab Results  Component Value Date   TSH 13.32* 01/04/2015   Lab Results  Component Value Date   FREET4 0.57* 01/04/2015  Denies mental confusion, cold intolerance, skin changes and constipation. Knows how to correctly take her levothyroxine but just didn't take it on some days. Recent thyroid levels were low. Fells that her family comes first and hasnt been taking care of herself  Hyperlipidemia-  her last set of lipids were- Currently on Lipitor and not controlled. Tolerating well. Recently non compliant again. Was also on zetia before. Being managed by her PCP.    Lab Results  Component Value Date   CHOL 213* 01/04/2015   HDL 44.20 01/04/2015   LDLCALC 129* 01/04/2015   LDLDIRECT 206.0 07/27/2014   TRIG 199.0* 01/04/2015   CHOLHDL 5 01/04/2015    Blood Pressure- Patient's blood pressure is well controlled today.  Reviewed past medical history, medications and allergies. Updated if necessary.   Current Outpatient Prescriptions on File Prior to Visit  Medication Sig Dispense Refill  . atorvastatin (LIPITOR) 80 MG tablet Take 1 tablet (80 mg total) by mouth daily. 30 tablet 11  . Canagliflozin (INVOKANA) 100 MG TABS Take 1 tablet (100 mg total) by mouth daily with breakfast. 30 tablet 6  . cyclobenzaprine (FLEXERIL) 5 MG tablet Take 1  tablet (5 mg total) by mouth 3 (three) times daily as needed for muscle spasms. 20 tablet 0  . levothyroxine (SYNTHROID, LEVOTHROID) 200 MCG tablet TAKE 1 TABLET BY MOUTH DAILY TAKE ALONG WITH THE 25 MG DOSE. 30 tablet 5  . levothyroxine (SYNTHROID, LEVOTHROID) 25 MCG tablet Take 1 tablet (25 mcg total) by mouth daily before breakfast. 90 tablet 3  . RELPAX 40 MG tablet TAKE 1TAB AT ONSET OF HEADACHE/MIGRANE*MAY REPEAT IN 2 HRS IF PERSISTS OR RECURS* 10 tablet 0  . Benzocaine-Resorcinol 20-3 % CREA Apply as directed daily (Patient not taking: Reported on 01/06/2015) 28 g 1  . glucose blood (ONE TOUCH ULTRA TEST) test strip Use to check blood sugars two times a day.  Dx: 250.42 (Patient not taking: Reported on 01/06/2015) 100 each 5  . Insulin Pen Needle (BD PEN NEEDLE NANO U/F) 32G X 4 MM MISC Use for insulin injections once daily. (Patient not taking: Reported on 01/06/2015) 30 each 3  . ondansetron (ZOFRAN ODT) 8 MG disintegrating tablet Take 1 tablet (8 mg total) by mouth every 8 (eight) hours as needed for nausea or vomiting. (Patient not taking: Reported on 01/06/2015) 30 tablet 0  . pentafluoroprop-tetrafluoroeth (GEBAUERS) AERO Apply 1 application topically as needed. (Patient not taking: Reported on 01/06/2015) 103.5 mL 0   No current facility-administered medications on file prior to visit.   Allergies  Allergen Reactions  . Codeine Hives, Anxiety and Other (See Comments)    REACTION: N \\T \ V and hyper. Pt can take Percocet.     Review of Systems- [ x ]  Complains of    [  ]  denies [  ] Recent weight change [x  ]  Fatigue [ x ] polydipsia [  ] polyuria [ x ]  nocturia [  ]  vision difficulty [  ] chest pain [  ] shortness of breath [  ] leg swelling [  ] cough [  ] nausea/vomiting [  ] diarrhea [  ] constipation [  ] abdominal pain [  ]  tingling/numbness in extremities [  ]  concern with feet ( wounds/sores)  PE: BP 110/72 mmHg  Pulse 93  Ht 4' 11.5" (1.511 m)  Wt 149 lb 4 oz  (67.699 kg)  BMI 29.65 kg/m2  SpO2 96%  LMP 08/11/2012 Wt Readings from Last 3 Encounters:  01/06/15 149 lb 4 oz (67.699 kg)  12/06/14 143 lb (64.864 kg)  09/01/14 151 lb 8 oz (68.72 kg)   HEENT: [place]/AT, EOMI, no icterus, no proptosis, no chemosis, no mild lid lag, no retraction, eyes close completely Neck: thyroid gland - smooth, non-tender, no erythema, no tracheal deviation; negative Pemberton's sign; no lymphadenopathy; no bruits Lungs: good air entry, clear bilaterally Heart: S1&S2 normal, regular rate & rhythm; no  murmurs, rubs or gallops Ext: no tremor in hands bilaterally, no edema, 2+ DP/PT pulses, good muscle mass Neuro: normal gait, 2+ reflexes bilaterally, normal 5/5 strength, no proximal myopathy  Derm: no pretibial myxoedema/skin dryness   ASSESSMENT AND PLAN:  Problem List Items Addressed This Visit      Cardiovascular and Mediastinum   Essential hypertension    BP well controlled today. Update urine MA at next visits when sugars are better.            Endocrine   Hypothyroidism    Discussed importance of taking her thyroid medication as directed. Recent non compliance to medication. Asked her to continue current dose of levothyroxine for now and will recheck in 6 weeks.       Type 2 diabetes mellitus, uncontrolled, with renal complications - Primary    Recent A1c, is poorly controlled. She has again been non compliant, and we discussed at length that she needs to take better care of herself and the diabetes.   We discussed about importance of taking her medications and following her dietary plan and she is agreeable to maintain adherence.   We discussed about restarting lantus and then moving onto daily VGO device- she seems agreeable to the idea at this time. Will set her up for appointment with Vaughan Basta for training.   Restart  Lantus at 52 units daily. She has an appointment for next week with Vaughan Basta and will continue lantus till then.  Check sugars twice  daily at various time points.   She will continue current dose of Invokana 100 mg daily.   RTC 2 weeks           Relevant Medications   Insulin Glargine (LANTUS SOLOSTAR) 100 UNIT/ML Solostar Pen   Other Relevant Orders   Amb Referral to Nutrition and Diabetic E     Genitourinary   Kidney disease, chronic, stage 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)    GFR recently checked and stable.              Other   Hyperlipidemia    Recently less compliant with Lipitor. Her levels are quite, uncontrolled. May need addition of Zetia if LDL stays, uncontrolled. This is being monitored by her primary care physician.               - Return to clinic in  2 weeks with sugar log/meter.  Willis Holquin [facility] 01/06/2015 8:56 AM

## 2015-01-06 NOTE — Assessment & Plan Note (Signed)
BP well controlled today. Update urine MA at next visits when sugars are better.

## 2015-01-06 NOTE — Progress Notes (Signed)
Pre visit review using our clinic review tool, if applicable. No additional management support is needed unless otherwise documented below in the visit note. 

## 2015-01-06 NOTE — Assessment & Plan Note (Signed)
Recently less compliant with Lipitor. Her levels are quite, uncontrolled. May need addition of Zetia if LDL stays, uncontrolled. This is being monitored by her primary care physician.

## 2015-01-06 NOTE — Assessment & Plan Note (Signed)
Recent A1c, is poorly controlled. She has again been non compliant, and we discussed at length that she needs to take better care of herself and the diabetes.   We discussed about importance of taking her medications and following her dietary plan and she is agreeable to maintain adherence.   We discussed about restarting lantus and then moving onto daily VGO device- she seems agreeable to the idea at this time. Will set her up for appointment with Vaughan Basta for training.   Restart  Lantus at 52 units daily. She has an appointment for next week with Vaughan Basta and will continue lantus till then.  Check sugars twice daily at various time points.   She will continue current dose of Invokana 100 mg daily.   RTC 2 weeks

## 2015-01-10 ENCOUNTER — Other Ambulatory Visit: Payer: Self-pay | Admitting: Family Medicine

## 2015-01-10 ENCOUNTER — Ambulatory Visit (INDEPENDENT_AMBULATORY_CARE_PROVIDER_SITE_OTHER): Payer: 59 | Admitting: Family Medicine

## 2015-01-10 ENCOUNTER — Encounter: Payer: Self-pay | Admitting: Family Medicine

## 2015-01-10 VITALS — BP 110/74 | HR 93 | Temp 98.2°F | Ht 60.0 in | Wt 145.5 lb

## 2015-01-10 DIAGNOSIS — N644 Mastodynia: Secondary | ICD-10-CM

## 2015-01-10 DIAGNOSIS — E559 Vitamin D deficiency, unspecified: Secondary | ICD-10-CM

## 2015-01-10 DIAGNOSIS — IMO0002 Reserved for concepts with insufficient information to code with codable children: Secondary | ICD-10-CM

## 2015-01-10 DIAGNOSIS — E039 Hypothyroidism, unspecified: Secondary | ICD-10-CM

## 2015-01-10 DIAGNOSIS — E1129 Type 2 diabetes mellitus with other diabetic kidney complication: Secondary | ICD-10-CM

## 2015-01-10 DIAGNOSIS — M7551 Bursitis of right shoulder: Secondary | ICD-10-CM | POA: Insufficient documentation

## 2015-01-10 DIAGNOSIS — E785 Hyperlipidemia, unspecified: Secondary | ICD-10-CM

## 2015-01-10 DIAGNOSIS — I10 Essential (primary) hypertension: Secondary | ICD-10-CM

## 2015-01-10 DIAGNOSIS — Z0000 Encounter for general adult medical examination without abnormal findings: Secondary | ICD-10-CM

## 2015-01-10 DIAGNOSIS — E1165 Type 2 diabetes mellitus with hyperglycemia: Secondary | ICD-10-CM

## 2015-01-10 MED ORDER — DICLOFENAC SODIUM 75 MG PO TBEC: 75.0000 mg | DELAYED_RELEASE_TABLET | Freq: Two times a day (BID) | ORAL | Status: DC

## 2015-01-10 MED ORDER — CHOLECALCIFEROL 10 MCG (400 UNIT) PO CAPS: 400.0000 [IU] | ORAL_CAPSULE | Freq: Two times a day (BID) | ORAL | Status: DC

## 2015-01-10 NOTE — Patient Instructions (Addendum)
Start vit D3 400 IU twice daily.4 Good job restarting med. Continuing healthy eating and weight loss. Stop at front desk to set up mammo to eval left breast pain.  Treat bursitis of right shoulder with home PT and diclofenac twice daily x 2 weeks.

## 2015-01-10 NOTE — Progress Notes (Signed)
Pre visit review using our clinic review tool, if applicable. No additional management support is needed unless otherwise documented below in the visit note. 

## 2015-01-10 NOTE — Progress Notes (Signed)
Subjective:    Patient ID: [name] [name], female    DOB: [birth year], 54 y.o.   MRN: [id number]  HPI  The patient is here for annual wellness exam and preventative care.     She has noted knot on left chest wall in last few months, some pain when lies on left side.  Entire left breast tender. No change in size. No known breast injury.  She was seen recently 12/06/2014 for muscle spasm  in right neck.  Cyclobenzaprine 5-10 mg as needed. Neck feels better but still some upper arm pain, lateral shoulder pain. Using aleve or tylenol to help with pain.   Diabetes, poorly controlled:followed  by ENDO ( last appt 01/06/14).  Noncompliant with meds recently, now back on lantus injections:   Lab Results  Component Value Date   HGBA1C 12.8* 01/04/2015    Hypothyroid recent noncompliance with meds. Lab Results  Component Value Date   TSH 13.32* 01/04/2015   Hypertension:   Well controlled on  No med.  BP Readings from Last 3 Encounters:  01/10/15 110/74  01/06/15 110/72  12/06/14 118/84  Chest pain with exertion:None Edema:None Short of breath:None Average home BPs: Other issues:  Elevated Cholesterol:  Inadequate control, not at goal < LDL 100 noncompliant recently with lipotor 80 Lab Results  Component Value Date   CHOL 213* 01/04/2015   HDL 44.20 01/04/2015   LDLCALC 129* 01/04/2015   LDLDIRECT 206.0 07/27/2014   TRIG 199.0* 01/04/2015   CHOLHDL 5 01/04/2015  Muscle aches:  Diet compliance: moderate Exercise:walking more Other complaints:   VIt D def: on no vit D  Review of Systems  Constitutional: Negative for fever and fatigue.  HENT: Negative for ear pain.   Eyes: Negative for pain.  Respiratory: Negative for chest tightness and shortness of breath.   Cardiovascular: Negative for chest pain, palpitations and leg swelling.  Gastrointestinal: Negative for abdominal pain.  Genitourinary: Negative for dysuria.       Objective:   Physical Exam    Constitutional: Vital signs are normal. She appears well-developed and well-nourished. She is cooperative.  Non-toxic appearance. She does not appear ill. No distress.  HENT:  Head: Normocephalic.  Right Ear: Hearing, tympanic membrane, external ear and ear canal normal.  Left Ear: Hearing, tympanic membrane, external ear and ear canal normal.  Nose: Nose normal.  Eyes: Conjunctivae, EOM and lids are normal. Pupils are equal, round, and reactive to light. Lids are everted and swept, no foreign bodies found.  Neck: Trachea normal and normal range of motion. Neck supple. Carotid bruit is not present. No thyroid mass and no thyromegaly present.  Cardiovascular: Normal rate, regular rhythm, S1 normal, S2 normal, normal heart sounds and intact distal pulses.  Exam reveals no gallop.   No murmur heard. Pulmonary/Chest: Effort normal and breath sounds normal. No respiratory distress. She has no wheezes. She has no rhonchi. She has no rales.  Abdominal: Soft. Normal appearance and bowel sounds are normal. She exhibits no distension, no fluid wave, no abdominal bruit and no mass. There is no hepatosplenomegaly. There is no tenderness. There is no rebound, no guarding and no CVA tenderness. No hernia.  Genitourinary: There is breast tenderness. No breast swelling, discharge or bleeding.  Left breast, no masses but diffuse ttp prominent left sternoclavicular joint  Musculoskeletal:       Right shoulder: She exhibits decreased range of motion and tenderness. She exhibits no bony tenderness and no deformity.  Right lateral ttp  Lymphadenopathy:  She has no cervical adenopathy.    She has no axillary adenopathy.  Neurological: She is alert. She has normal strength. No cranial nerve deficit or sensory deficit.  Skin: Skin is warm, dry and intact. No rash noted.  Psychiatric: Her speech is normal and behavior is normal. Judgment normal. Her mood appears not anxious. Cognition and memory are normal. She  does not exhibit a depressed mood.          Assessment & Plan:  The patient's preventative maintenance and recommended screening tests for an annual wellness exam were reviewed in full today. Brought up to date unless services declined.  Counselled on the importance of diet, exercise, and its role in overall health and mortality. The patient's FH and SH was reviewed, including their home life, tobacco status, and drug and alcohol status.   Vaccines:uptodate COLON: 2009, Dr. Eugenia Pancoast, nml, plan repeat in 10 years.  DVE/pap: vaginal hysterectomy, ovaries remain.  Mammo: last 2009, due now

## 2015-01-11 ENCOUNTER — Encounter: Payer: 59 | Attending: Endocrinology | Admitting: Nutrition

## 2015-01-11 DIAGNOSIS — Z794 Long term (current) use of insulin: Secondary | ICD-10-CM | POA: Insufficient documentation

## 2015-01-11 DIAGNOSIS — E1165 Type 2 diabetes mellitus with hyperglycemia: Secondary | ICD-10-CM | POA: Insufficient documentation

## 2015-01-11 DIAGNOSIS — Z713 Dietary counseling and surveillance: Secondary | ICD-10-CM | POA: Insufficient documentation

## 2015-01-11 DIAGNOSIS — E1129 Type 2 diabetes mellitus with other diabetic kidney complication: Secondary | ICD-10-CM | POA: Insufficient documentation

## 2015-01-11 DIAGNOSIS — IMO0002 Reserved for concepts with insufficient information to code with codable children: Secondary | ICD-10-CM

## 2015-01-12 ENCOUNTER — Telehealth: Payer: Self-pay | Admitting: Nutrition

## 2015-01-12 NOTE — Telephone Encounter (Signed)
Patient was called, and she said that her blood sugar drop down before breakfast at 9AM to 146.  She took no bolus, and it went up to 266 before lunch.  She was told to take 2 button presses before all meals eaten, and no presses if not eating.  She reported good understanding of this and had no questions.    I will call her Friday to get blood sugar readings.  She was told to call the office if blood sugars drop low, or remain high.  She agreed to do this.

## 2015-01-12 NOTE — Progress Notes (Signed)
Patient is here today to review how to use the V-go. She was shown how to fill, apply and use it, and she agreed to try this.  She had taken her Lantus last night at 9PM, so she was not started on it at this time.  She re demonstrated correctly how to fill it with Humalog insulin and said she will apply it to her abdomen area this evening around 8PM.  She was given a starter kit with 6 V-Go 30s, and was given a vial of Humalog, and a copay card that will get her first month's prescription for free.  She was told to call [employer] customer care so they can check her insurance coverage, to let her know how much it will cost her.  She will activate the copay card when she calls customer service.    She was told to test her blood sugars before all meals and at bedtime to see how this dosage is working for her.  She agreed to do this, and was given extra test strips for this.  She was told to stop all Lantus, and to not take any meal time insulin with the V-go for now.  She was shown twice how to give the meal time insulin and redemonstrated this twice for me.  She verbally repeated that each button press series delivers 2 units of insulin.    I strongly encouraged her to put on a V-Go now with saline, but she refused saying that she will apply the Humalog filled one at 8PM tonight.  I will call her tomorrow to confirm that she has done this, and she agreed to come back tomorrow to insert this, if she did not do it tonight.  She reported that she will have her husband help her with this.  I will call her tomorrow morning to see if she applied the V-go, and will call her on Friday to review blood sugar readings with her.   

## 2015-01-12 NOTE — Telephone Encounter (Signed)
Great, thank you so much for calling the patient.

## 2015-01-12 NOTE — Telephone Encounter (Signed)
Pt. Reported applying the V-go last night at 8PM without any difficulty.  She reported no difficulty sleeping with it.   Blood sugar acS last night: 303, HS: 339.  FBS today: 239.

## 2015-01-12 NOTE — Patient Instructions (Signed)
Fill and apply a new V-go 30 every evening at 8PM.  Take no meal time insulin at this time.

## 2015-01-12 NOTE — Telephone Encounter (Signed)
That is a good start. I have an appt to see her Feb 11th. For now, we can work on following her sugars and making changes.  Vaughan Basta- could you check back with her on Friday to see how she is doing with the [place]. I think we may need to assess whether she needs to move up to [place] 40 at that time.  Tonika-For now, please could you ask her to press 2 clicks on the VGO for her BF, lunch and dinner provided she is eating and as she gets ready to eat.  If she has any trouble understanding this, please let me know and I can call her.   Thank you both for your help.

## 2015-01-13 ENCOUNTER — Ambulatory Visit
Admission: RE | Admit: 2015-01-13 | Discharge: 2015-01-13 | Disposition: A | Discharge status: Home / Self Care | Payer: 59 | Source: Ambulatory Visit | Attending: Family Medicine | Admitting: Family Medicine

## 2015-01-13 DIAGNOSIS — N644 Mastodynia: Secondary | ICD-10-CM

## 2015-01-14 ENCOUNTER — Telehealth: Payer: Self-pay | Admitting: *Deleted

## 2015-01-14 ENCOUNTER — Other Ambulatory Visit: Payer: Self-pay | Admitting: Endocrinology

## 2015-01-14 MED ORDER — INSULIN LISPRO 100 UNIT/ML ~~LOC~~ SOLN: SUBCUTANEOUS | Status: DC

## 2015-01-14 MED ORDER — V-GO 30 KIT: PACK | Status: DC

## 2015-01-14 NOTE — Telephone Encounter (Signed)
VGO 30 kits and Humalog sent to her pharmacy.

## 2015-01-14 NOTE — Telephone Encounter (Signed)
Pt states she was started on the Vgo, needs refill on her insulin, unsure name, looks like it is Humalog, to [facility] [place]. Also needs the pods or reservoirs that go with the Vgo. I am unsure what to order, please advise correct supplies needed for this pump

## 2015-01-17 NOTE — Telephone Encounter (Signed)
Phone Call to patient.  She says she has no difficulty applying a using the V-go.  She is taking 2 clicks before meals.  Says FBSs are around 160, and that she is "good before most meals".  She did not have her record book.  She was in the car.  She was told to call the office with blood sugar readings, and to see if they have prescribed the V-gos for you.  She will run out of them on Sunday.

## 2015-01-17 NOTE — Telephone Encounter (Signed)
Thanks for checking up on her. I had sent the Rx for the Humalog and the VGO this past Friday. I will follow up with the patient at her upcoming appointment.

## 2015-01-20 ENCOUNTER — Encounter: Payer: Self-pay | Admitting: Endocrinology

## 2015-01-20 ENCOUNTER — Ambulatory Visit (INDEPENDENT_AMBULATORY_CARE_PROVIDER_SITE_OTHER): Payer: 59 | Admitting: Endocrinology

## 2015-01-20 VITALS — BP 106/66 | HR 83 | Resp 14 | Ht 59.5 in | Wt 150.2 lb

## 2015-01-20 DIAGNOSIS — E1129 Type 2 diabetes mellitus with other diabetic kidney complication: Secondary | ICD-10-CM

## 2015-01-20 DIAGNOSIS — I10 Essential (primary) hypertension: Secondary | ICD-10-CM

## 2015-01-20 DIAGNOSIS — E1165 Type 2 diabetes mellitus with hyperglycemia: Secondary | ICD-10-CM

## 2015-01-20 DIAGNOSIS — IMO0002 Reserved for concepts with insufficient information to code with codable children: Secondary | ICD-10-CM

## 2015-01-20 DIAGNOSIS — N182 Chronic kidney disease, stage 2 (mild): Secondary | ICD-10-CM

## 2015-01-20 MED ORDER — ONETOUCH ULTRASOFT LANCETS MISC: Status: DC

## 2015-01-20 NOTE — Progress Notes (Signed)
Reason for visit-  [name] [name] is a 54 y.o.-year-old female, here for management of Type 2 diabetes, uncontrolled, with complications ( stage 2 CKD, neuropathy). Last visit with me  Jan 2016.   HPI- Diagnosed with diabetes in 2012.    -Tried Metformin and states that "it wasn't working". Probably affected her IBS symptoms as well, but doesn't recall specifically .  -Then tried glipizide and that " didn't work either".  -Has been on insulin since 6 + months ~2015. -Started on lantus, but having problems with compliance as having to force herself to take the shot. Doesn't like injectable therapy. Trying to get her husband to give her the injections.  - Has been on Novolog before but doesn't like the shots.  -Switched from Lantus to [place] with Humalog Jan 2016  Pt is currently on a regimen of: - VGO 30 with 2 clicks with each meal.  - Invokana 100 mg daily ( start 08/2014)  * likes the VGO and tolerating well. Inserting the VGO device herself. Developing some redness around sticky tape, no itching.   Last hemoglobin A1c was uncontrolled. Recent improved compliance: Lab Results  Component Value Date   HGBA1C 12.8* 01/04/2015   HGBA1C 12.2* 07/27/2014   HGBA1C 9.4* 04/14/2014     Pt checks her sugars 1-3 x a day . Uses One Touch ultra 2 glucometer. By download -.  PREMEAL Breakfast Lunch Dinner Bedtime Overall  Glucose range: 103-243 167-241 209-303 101-339   Mean/median:        Hypoglycemia-  No lows recently.    Dietary habits- eats twice daily ( lunch and dinner) .  Weakness is mountain dew - used to drink 12 pack  A day on some days when she feels more thirsty. Now down to 1 a day.  Now trying to drink lemon water.    Exercise- walks every other day- 20 minutes , active around the house and  babysitting.  Weight -  Wt Readings from Last 3 Encounters:  01/20/15 150 lb 4 oz (68.153 kg)  01/10/15 145 lb 8 oz (65.998 kg)  01/06/15 149 lb 4 oz (67.699 kg)    Diabetes  Complications- Known complications Nephropathy- Yes  Stage 2 CKD, last BUN/creatinine- No recent urine MA.-being deferred due to high A1c Lab Results  Component Value Date   BUN 12 01/04/2015   CREATININE 0.78 01/04/2015   Lab Results  Component Value Date   MICROALBUR 0.3 04/16/2011   Lab Results  Component Value Date   GFR 82.59 01/04/2015    Retinopathy- No, Last DEE was last year, developing cataracts-" eye doctor wants to see her when her sugars are better" Neuropathy- occ numbness and tingling in her feet. Known neuropathy.  Associated history - No history of CAD or prior stroke. Has hypothyroidism and is bring treated with levothyroxine by Her PCP. Now taking it as directed. her last TSH was  Lab Results  Component Value Date   TSH 13.32* 01/04/2015   Lab Results  Component Value Date   FREET4 0.57* 01/04/2015  Denies mental confusion, cold intolerance, skin changes and constipation. Knows how to correctly take her levothyroxine. Recent thyroid levels were low.   Hyperlipidemia-  her last set of lipids were- Currently on Lipitor and not controlled. Tolerating well. Recently  compliant again. Was also on zetia before. Being managed by her PCP.    Lab Results  Component Value Date   CHOL 213* 01/04/2015   HDL 44.20 01/04/2015   LDLCALC 129*  01/04/2015   LDLDIRECT 206.0 07/27/2014   TRIG 199.0* 01/04/2015   CHOLHDL 5 01/04/2015    Blood Pressure- Patient's blood pressure is well controlled today.  Reviewed past medical history, medications and allergies. Updated if necessary.   Current Outpatient Prescriptions on File Prior to Visit  Medication Sig Dispense Refill  . atorvastatin (LIPITOR) 80 MG tablet Take 1 tablet (80 mg total) by mouth daily. 30 tablet 11  . Canagliflozin (INVOKANA) 100 MG TABS Take 1 tablet (100 mg total) by mouth daily with breakfast. 30 tablet 6  . Cholecalciferol 400 UNITS CAPS Take 1 capsule (400 Units total) by mouth 2 (two) times daily. 60  each 11  . diclofenac (VOLTAREN) 75 MG EC tablet Take 1 tablet (75 mg total) by mouth 2 (two) times daily. 30 tablet 0  . glucose blood (ONE TOUCH ULTRA TEST) test strip Use to check blood sugars two times a day.  Dx: 250.42 100 each 5  . Insulin Disposable Pump (V-GO 30) KIT Use for insulin delivery daily 30 kit 3  . insulin lispro (HUMALOG) 100 UNIT/ML injection Use with VGO 30 device daily 20 mL 3  . Insulin Pen Needle (BD PEN NEEDLE NANO U/F) 32G X 4 MM MISC Use for insulin injections once daily. 30 each 3  . levothyroxine (SYNTHROID, LEVOTHROID) 200 MCG tablet TAKE 1 TABLET BY MOUTH DAILY TAKE ALONG WITH THE 25 MG DOSE. 30 tablet 5  . levothyroxine (SYNTHROID, LEVOTHROID) 25 MCG tablet Take 1 tablet (25 mcg total) by mouth daily before breakfast. 90 tablet 3  . ondansetron (ZOFRAN-ODT) 8 MG disintegrating tablet Take 8 mg by mouth every 8 (eight) hours as needed for nausea or vomiting.    . pentafluoroprop-tetrafluoroeth (GEBAUERS) AERO Apply 1 application topically as needed. 103.5 mL 0  . RELPAX 40 MG tablet TAKE 1TAB AT ONSET OF HEADACHE/MIGRANE*MAY REPEAT IN 2 HRS IF PERSISTS OR RECURS* 10 tablet 0   No current facility-administered medications on file prior to visit.   Allergies  Allergen Reactions  . Codeine Hives, Anxiety and Other (See Comments)    REACTION: N \T_0 V and hyper. Pt can take Percocet.     Review of Systems- [ x ]  Complains of    [  ]  denies [  ] Recent weight change [  ]  Fatigue [  ] polydipsia [  ] polyuria [  ]  nocturia [  ]  vision difficulty [  ] chest pain [  ] shortness of breath [  ] leg swelling [  ] cough [  ] nausea/vomiting [  ] diarrhea [  ] constipation [  ] abdominal pain [  ]  tingling/numbness in extremities [  ]  concern with feet ( wounds/sores)   PE: BP 106/66 mmHg  Pulse 83  Resp 14  Ht 4' 11.5" (1.511 m)  Wt 150 lb 4 oz (68.153 kg)  BMI 29.85 kg/m2  SpO2 95%  LMP 08/11/2012 Wt Readings from Last 3 Encounters:  01/20/15 150 lb 4  oz (68.153 kg)  01/10/15 145 lb 8 oz (65.998 kg)  01/06/15 149 lb 4 oz (67.699 kg)    Exam:  Gen: NAD Abdomen: mild papular erythema without any signs of infection around VGO sites   ASSESSMENT AND PLAN:  Problem List Items Addressed This Visit      Cardiovascular and Mediastinum   Essential hypertension    BP well controlled today. Update urine MA at next visits when sugars are better.  Endocrine   Type 2 diabetes mellitus, uncontrolled, with renal complications - Primary    Recent A1c, is poorly controlled.She has been more compliant since past visit with Fs checks, using the VGO device and making dietary changes. Congratulated her on this.  Asked her to use some moisturizing cream prior to applying the device.  Change mealtime clicks to 4 per meal.  Continue VGO 30 and Invokana.  Check sugars 3 x daily.  Notify if sugars stay high or low.  RTC 4 weeks               Genitourinary   Kidney disease, chronic, stage 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)    GFR recently checked and stable.                     - Return to clinic in  4 weeks with sugar log/meter. Thyroid labs next visit.   , [facility] 01/20/2015 12:21 PM

## 2015-01-20 NOTE — Assessment & Plan Note (Signed)
Recent A1c, is poorly controlled.She has been more compliant since past visit with Fs checks, using the VGO device and making dietary changes. Congratulated her on this.  Asked her to use some moisturizing cream prior to applying the device.  Change mealtime clicks to 4 per meal.  Continue VGO 30 and Invokana.  Check sugars 3 x daily.  Notify if sugars stay high or low.  RTC 4 weeks

## 2015-01-20 NOTE — Assessment & Plan Note (Signed)
BP well controlled today. Update urine MA at next visits when sugars are better.

## 2015-01-20 NOTE — Assessment & Plan Note (Signed)
GFR recently checked and stable.

## 2015-01-20 NOTE — Patient Instructions (Addendum)
Check sugars 3 times daily ( fasting and premeal readings at alternating times, or at bedtime).  Record them in a sugar log and bring log and meter to next appointment.   Increase to 4 clicks of VGO at each meal time.  Work on [employer] intake to water- if you start having lows below 80- then please let me know.  Also let me know if your blood sugars stay in the 200 range at any particular time of the day.  Please come back for a follow-up appointment in 1 month.

## 2015-01-25 ENCOUNTER — Telehealth: Payer: Self-pay | Admitting: *Deleted

## 2015-01-25 DIAGNOSIS — N644 Mastodynia: Secondary | ICD-10-CM | POA: Insufficient documentation

## 2015-01-25 NOTE — Assessment & Plan Note (Signed)
Treat with NSAIDs, start gentle home PT

## 2015-01-25 NOTE — Telephone Encounter (Signed)
Pt notified and verbalized understanding.

## 2015-01-25 NOTE — Assessment & Plan Note (Signed)
Well controlled. Continue current medication.  

## 2015-01-25 NOTE — Assessment & Plan Note (Signed)
Restart meds.

## 2015-01-25 NOTE — Assessment & Plan Note (Addendum)
Not at goal but has not ben taking regualrly. Pt ready to restart.

## 2015-01-25 NOTE — Assessment & Plan Note (Signed)
Replete

## 2015-01-25 NOTE — Assessment & Plan Note (Signed)
Eval with diagnostic mammogram.

## 2015-01-25 NOTE — Telephone Encounter (Signed)
-----   Message from Haydee Monica, MD sent at 01/25/2015 10:19 AM EST ----- Regarding: call pt please Please could you ask her to call the company that gives her the [place] - the customer service there can help her suggest with products that help with the skin rash that she is having. There is a product available OTC called [id number] that is like a thin plastic film that she can apply over her skin prior to putting on the VGO that will help avoid the rash.

## 2015-01-25 NOTE — Assessment & Plan Note (Signed)
Followed by ENDO. Encouraged her to restart and be compliant with meds.

## 2015-01-27 ENCOUNTER — Emergency Department (HOSPITAL_COMMUNITY)
Admission: EM | Admit: 2015-01-27 | Discharge: 2015-01-27 | Disposition: A | Discharge status: Home / Self Care | Payer: 59 | Attending: Emergency Medicine | Admitting: Emergency Medicine

## 2015-01-27 ENCOUNTER — Encounter (HOSPITAL_COMMUNITY): Payer: Self-pay | Admitting: Neurology

## 2015-01-27 ENCOUNTER — Emergency Department (HOSPITAL_COMMUNITY): Payer: 59

## 2015-01-27 ENCOUNTER — Telehealth: Payer: Self-pay | Admitting: Family Medicine

## 2015-01-27 DIAGNOSIS — I129 Hypertensive chronic kidney disease with stage 1 through stage 4 chronic kidney disease, or unspecified chronic kidney disease: Secondary | ICD-10-CM | POA: Insufficient documentation

## 2015-01-27 DIAGNOSIS — I4891 Unspecified atrial fibrillation: Secondary | ICD-10-CM | POA: Insufficient documentation

## 2015-01-27 DIAGNOSIS — Z87891 Personal history of nicotine dependence: Secondary | ICD-10-CM | POA: Diagnosis not present

## 2015-01-27 DIAGNOSIS — Z8659 Personal history of other mental and behavioral disorders: Secondary | ICD-10-CM | POA: Insufficient documentation

## 2015-01-27 DIAGNOSIS — Z791 Long term (current) use of non-steroidal anti-inflammatories (NSAID): Secondary | ICD-10-CM | POA: Diagnosis not present

## 2015-01-27 DIAGNOSIS — R519 Headache, unspecified: Secondary | ICD-10-CM

## 2015-01-27 DIAGNOSIS — E785 Hyperlipidemia, unspecified: Secondary | ICD-10-CM | POA: Diagnosis not present

## 2015-01-27 DIAGNOSIS — G629 Polyneuropathy, unspecified: Secondary | ICD-10-CM | POA: Insufficient documentation

## 2015-01-27 DIAGNOSIS — R112 Nausea with vomiting, unspecified: Secondary | ICD-10-CM | POA: Diagnosis not present

## 2015-01-27 DIAGNOSIS — R51 Headache: Secondary | ICD-10-CM | POA: Diagnosis not present

## 2015-01-27 DIAGNOSIS — E039 Hypothyroidism, unspecified: Secondary | ICD-10-CM | POA: Diagnosis not present

## 2015-01-27 DIAGNOSIS — Z794 Long term (current) use of insulin: Secondary | ICD-10-CM | POA: Insufficient documentation

## 2015-01-27 DIAGNOSIS — N182 Chronic kidney disease, stage 2 (mild): Secondary | ICD-10-CM | POA: Insufficient documentation

## 2015-01-27 DIAGNOSIS — E119 Type 2 diabetes mellitus without complications: Secondary | ICD-10-CM | POA: Insufficient documentation

## 2015-01-27 DIAGNOSIS — R197 Diarrhea, unspecified: Secondary | ICD-10-CM | POA: Diagnosis not present

## 2015-01-27 DIAGNOSIS — Z8719 Personal history of other diseases of the digestive system: Secondary | ICD-10-CM | POA: Diagnosis not present

## 2015-01-27 DIAGNOSIS — K219 Gastro-esophageal reflux disease without esophagitis: Secondary | ICD-10-CM | POA: Diagnosis not present

## 2015-01-27 LAB — COMPREHENSIVE METABOLIC PANEL
ALK PHOS: 96 U/L (ref 39–117)
ALT: 17 U/L (ref 0–35)
ANION GAP: 10 (ref 5–15)
AST: 21 U/L (ref 0–37)
Albumin: 3.7 g/dL (ref 3.5–5.2)
BUN: 17 mg/dL (ref 6–23)
CO2: 20 mmol/L (ref 19–32)
Calcium: 9.1 mg/dL (ref 8.4–10.5)
Chloride: 105 mmol/L (ref 96–112)
Creatinine, Ser: 0.67 mg/dL (ref 0.50–1.10)
GFR calc non Af Amer: >90 mL/min (ref >90)
Glucose, Bld: 230 mg/dL — ABNORMAL HIGH (ref 70–99)
Potassium: 4.4 mmol/L (ref 3.5–5.1)
Sodium: 135 mmol/L (ref 135–145)
TOTAL PROTEIN: 7.2 g/dL (ref 6.0–8.3)
Total Bilirubin: 1.1 mg/dL (ref 0.3–1.2)

## 2015-01-27 LAB — CBC WITH DIFFERENTIAL/PLATELET
Basophils Absolute: 0 10*3/uL (ref 0.0–0.1)
Basophils Relative: 0 % (ref 0–1)
Eosinophils Absolute: 0 10*3/uL (ref 0.0–0.7)
Eosinophils Relative: 0 % (ref 0–5)
HCT: 48.7 % — ABNORMAL HIGH (ref 36.0–46.0)
HEMOGLOBIN: 16.4 g/dL — AB (ref 12.0–15.0)
Lymphocytes Relative: 13 % (ref 12–46)
Lymphs Abs: 1.7 10*3/uL (ref 0.7–4.0)
MCH: 27.8 pg (ref 26.0–34.0)
MCHC: 33.7 g/dL (ref 30.0–36.0)
MCV: 82.5 fL (ref 78.0–100.0)
MONO ABS: 0.5 10*3/uL (ref 0.1–1.0)
MONOS PCT: 4 % (ref 3–12)
NEUTROS PCT: 83 % — AB (ref 43–77)
Neutro Abs: 10.5 10*3/uL — ABNORMAL HIGH (ref 1.7–7.7)
PLATELETS: 244 10*3/uL (ref 150–400)
RBC: 5.9 MIL/uL — ABNORMAL HIGH (ref 3.87–5.11)
RDW: 16.1 % — AB (ref 11.5–15.5)
WBC: 12.7 10*3/uL — ABNORMAL HIGH (ref 4.0–10.5)

## 2015-01-27 LAB — I-STAT TROPONIN, ED: Troponin i, poc: 0 ng/mL (ref 0.00–0.08)

## 2015-01-27 LAB — TSH: TSH: 0.155 u[IU]/mL — ABNORMAL LOW (ref 0.350–4.500)

## 2015-01-27 LAB — MAGNESIUM: MAGNESIUM: 1.8 mg/dL (ref 1.5–2.5)

## 2015-01-27 LAB — LIPASE, BLOOD: Lipase: 30 U/L (ref 11–59)

## 2015-01-27 LAB — T4, FREE: FREE T4: 2.3 ng/dL — AB (ref 0.80–1.80)

## 2015-01-27 LAB — CBG MONITORING, ED: Glucose-Capillary: 260 mg/dL — ABNORMAL HIGH (ref 70–99)

## 2015-01-27 MED ORDER — ONDANSETRON HCL 4 MG PO TABS: 4.0000 mg | ORAL_TABLET | Freq: Four times a day (QID) | ORAL | Status: DC

## 2015-01-27 MED ORDER — RIVAROXABAN 20 MG PO TABS: 20.0000 mg | ORAL_TABLET | Freq: Every day | ORAL | Status: DC

## 2015-01-27 MED ORDER — DIPHENHYDRAMINE HCL 50 MG/ML IJ SOLN
25.0000 mg | Freq: Once | INTRAMUSCULAR | Status: AC
  Administered 2015-01-27: 25 mg via INTRAVENOUS
  Filled 2015-01-27: qty 1

## 2015-01-27 MED ORDER — SODIUM CHLORIDE 0.9 % IV BOLUS (SEPSIS)
1000.0000 mL | Freq: Once | INTRAVENOUS | Status: AC
  Administered 2015-01-27: 1000 mL via INTRAVENOUS

## 2015-01-27 MED ORDER — DEXAMETHASONE SODIUM PHOSPHATE 10 MG/ML IJ SOLN
10.0000 mg | Freq: Once | INTRAMUSCULAR | Status: AC
  Administered 2015-01-27: 10 mg via INTRAVENOUS
  Filled 2015-01-27: qty 1

## 2015-01-27 MED ORDER — KETOROLAC TROMETHAMINE 30 MG/ML IJ SOLN
30.0000 mg | Freq: Once | INTRAMUSCULAR | Status: AC
  Administered 2015-01-27: 30 mg via INTRAVENOUS
  Filled 2015-01-27: qty 1

## 2015-01-27 MED ORDER — METOCLOPRAMIDE HCL 5 MG/ML IJ SOLN
10.0000 mg | Freq: Once | INTRAMUSCULAR | Status: AC
  Administered 2015-01-27: 10 mg via INTRAVENOUS
  Filled 2015-01-27: qty 2

## 2015-01-27 NOTE — ED Notes (Signed)
CBG 260

## 2015-01-27 NOTE — ED Provider Notes (Signed)
CSN: [id number]     Arrival date & time 01/27/15  8333 History   First MD Initiated Contact with Patient 01/27/15 [phone]     Chief Complaint  Patient presents with  . Nausea  . Emesis     (Consider location/radiation/quality/duration/timing/severity/associated sxs/prior Treatment) HPI Comments: Patient presents today with a chief complaint of nausea, vomiting, and diarrhea.  She reports onset of symptoms around 2 AM this morning.  She reports eight episodes of vomiting and three episodes of diarrhea.  No blood in her emesis or blood in her stool.  She was given 4 mg IV Zofran en route to the ED, which she reports significantly improved her nausea.  She reports mild diffuse abdominal pain that does not radiate.  She denies fever, chills, or urinary symptoms.  Patient was found to have A fib on a EKG done by EMS en route.  She denies any prior history of A fib.  She reports that she has been having palpitations intermittently over the past 2 days.  She denies any chest pain.  She reports intermittent SOB over the past month.  Denies SOB at this time.  Denies dizziness or syncope.  She denies any history of CHF or other cardiac history.  She does have a history of HTN and DM.  She is also complaining of a frontal headache that has been constant since 2 AM.  She reports that she also feels some tension in the posterior neck.  She reports that headache feels similar to headaches that she has had in the past.  No vision changes, focal weakness, numbness, or tingling.    Patient is a 54 y.o. female presenting with vomiting. The history is provided by the patient.  Emesis   Past Medical History  Diagnosis Date  . Osteoporosis   . Intermittent vertigo   . B12 deficiency   . Peripheral neuropathy   . GERD (gastroesophageal reflux disease)   . Anxiety   . Hyperlipidemia   . Hypothyroidism   . IBS (irritable bowel syndrome)   . Hypertension   . Leg cramping     "@ night" (09/18/2013)  . Type II  diabetes mellitus   . Umbilical hernia     unrepaired (09/18/2013)  . Migraine     "once q couple months" (09/18/2013)  . 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 Cesarean section  1989  . Laparoscopic assisted vaginal hysterectomy  09/23/2012    Procedure: LAPAROSCOPIC ASSISTED VAGINAL HYSTERECTOMY;  Surgeon: Linda Hedges, DO;  Location: [facility];  Service: Gynecology;  Laterality: N/A;  pull Dr Gregor Hams instrument  . Tubal ligation Bilateral 1992  . Vaginal hysterectomy  2013   Family History  Problem Relation Age of Onset  . Heart failure Mother   . Diabetes Maternal Grandmother   . Alzheimer's disease Maternal Grandmother   . Coronary artery disease Maternal Grandmother   . Heart attack Maternal Grandfather   . Diabetes Other   . Heart disease Other    History  Substance Use Topics  . Smoking status: Former Smoker -- .2 years    Types: Cigarettes    Quit date: 12/10/1990  . Smokeless tobacco: Never Used  . Alcohol Use: No   OB History    No data available     Review of Systems  Gastrointestinal: Positive for vomiting.  All other systems reviewed and are negative.     Allergies  Codeine  Home Medications   Prior to Admission medications   Medication Sig Start Date End Date Taking? Authorizing Provider  atorvastatin (LIPITOR) 80 MG tablet Take 1 tablet (80 mg total) by mouth daily. 04/27/14   Amy Cletis Athens, MD  Canagliflozin (INVOKANA) 100 MG TABS Take 1 tablet (100 mg total) by mouth daily with breakfast. 08/11/14   Haydee Monica, MD  Cholecalciferol 400 UNITS CAPS Take 1 capsule (400 Units total) by mouth 2 (two) times daily. 01/10/15   Amy Cletis Athens, MD  diclofenac (VOLTAREN) 75 MG EC tablet Take 1 tablet (75 mg total) by mouth 2 (two) times daily. 01/10/15   Amy Cletis Athens, MD  glucose blood (ONE TOUCH ULTRA TEST) test strip Use to check blood sugars two times a day.  Dx: 250.42 08/27/14    Jinny Sanders, MD  Insulin Disposable Pump (V-GO 30) KIT Use for insulin delivery daily 01/14/15   Radhika P Phadke, MD  insulin lispro (HUMALOG) 100 UNIT/ML injection Use with VGO 30 device daily 01/14/15   Radhika P Phadke, MD  Insulin Pen Needle (BD PEN NEEDLE NANO U/F) 32G X 4 MM MISC Use for insulin injections once daily. 08/11/14   Haydee Monica, MD  Lancets (ONETOUCH ULTRASOFT) lancets Use as instructed 01/20/15   Radhika P Phadke, MD  levothyroxine (SYNTHROID, LEVOTHROID) 200 MCG tablet TAKE 1 TABLET BY MOUTH DAILY TAKE ALONG WITH THE 25 MG DOSE. 10/07/14   Amy Cletis Athens, MD  levothyroxine (SYNTHROID, LEVOTHROID) 25 MCG tablet Take 1 tablet (25 mcg total) by mouth daily before breakfast. 01/19/14   Amy E Diona Browner, MD  ondansetron (ZOFRAN-ODT) 8 MG disintegrating tablet Take 8 mg by mouth every 8 (eight) hours as needed for nausea or vomiting.    Historical Provider, MD  pentafluoroprop-tetrafluoroeth Landry Dyke) AERO Apply 1 application topically as needed. 04/27/14   Amy E Bedsole, MD  RELPAX 40 MG tablet TAKE 1TAB AT ONSET OF HEADACHE/MIGRANE*MAY REPEAT IN 2 HRS IF PERSISTS OR RECURS* 04/27/14   Amy E Diona Browner, MD   BP 115/77 mmHg  Pulse 98  Temp(Src) 97.6 F (36.4 C) (Oral)  Resp 19  SpO2 97%  LMP 08/11/2012 Physical Exam  Constitutional: She appears well-developed and well-nourished.  HENT:  Head: Normocephalic and atraumatic.  Mouth/Throat: Oropharynx is clear and moist.  Eyes: EOM are normal. Pupils are equal, round, and reactive to light.  Neck: Normal range of motion. Neck supple.  Cardiovascular: Normal rate, regular rhythm and normal heart sounds.   Pulmonary/Chest: Effort normal and breath sounds normal.  Abdominal: Soft. Bowel sounds are normal. She exhibits no distension and no mass. There is no tenderness. There is no rebound and no guarding.  Musculoskeletal: Normal range of motion.  Neurological: She is alert. She has normal strength. No cranial nerve deficit or sensory  deficit. Coordination normal.  Skin: Skin is warm and dry.  Psychiatric: She has a normal mood and affect.  Nursing note and vitals reviewed.   ED Course  Procedures (including critical care time) Labs Review Labs Reviewed  CBC WITH DIFFERENTIAL/PLATELET - Abnormal; Notable for the following:    WBC 12.7 (*)    RBC 5.90 (*)    Hemoglobin 16.4 (*)    HCT 48.7 (*)    RDW 16.1 (*)    Neutrophils Relative % 83 (*)    Neutro Abs 10.5 (*)    All other components within normal limits  COMPREHENSIVE METABOLIC PANEL  LIPASE, BLOOD  I-STAT TROPOININ, ED  CBG MONITORING, ED  Imaging Review No results found.   EKG Interpretation   Date/Time:  Thursday January 27 2015 08:50:53 EST Ventricular Rate:  88 PR Interval:    QRS Duration: 77 QT Interval:  359 QTC Calculation: 434 R Axis:   104 Text Interpretation:  Atrial fibrillation Right axis deviation  Anteroseptal infarct, old afib is new Confirmed by Alvino Chapel  MD, NATHAN  [phone]) on 01/27/2015 9:00:57 AM     12:10 PM Reassessed patient.  She reports that headache and nausea have improved.  Abdomen soft and tender.  Will fluid challenge.  Heart rate controlled at this time.  Denies CP, SOB, or palpitations. 1:01 PM Reassessed patient.  She is tolerating PO liquids.  She states that her headache had improved, but now has returned.   3:02 PM Discussed with Dr Diona Browner.  She recommends starting the patient on Xarelto for new onset A fib and requested a T3 and T4.  She states that she will follow up with the patient in the office and adjust Synthroid dose accordingly. MDM   Final diagnoses:  None   Patient presents today with nausea, vomiting, and diarrhea.  Abdomen soft and non tender.  Labs unremarkable aside from mild leukocytosis.  Nausea improved while in the ED after given Zofran.  Patient also found to have new onset A fib without RVR.  Rate has been controlled during entire ED course.  She is not having any symptoms  associated with A fib.  She has a [place] VASC score of 3, which would make anticoagulants recommended.  Patient started on Xarelto.  Patient also with a history of Hypothyoidism currently on Synthroid.  TSH found to be low today.  T3 and T4 pending.  Results discussed with Dr. Diona Browner the patient's PCP.  She states that she will adjust the Synthroid dose accordingly.  She will follow up with the patient in the office in the next couple of days.  Patient also with a headache.  Pt HA treated and improved while in ED.  Presentation is like pts typical HA and non concerning for [facility], ICH, Meningitis, or temporal arteritis. Pt is afebrile with no focal neuro deficits, nuchal rigidity, or change in vision. Patient stable for discharge.   Pt verbalizes understanding and is agreeable with plan to dc.  All results discussed with patient.  Return precautions given.    Hyman Bible, PA-C 01/29/15 [place], PA-C 01/29/15 Bibo Alvino Chapel, MD 02/01/15 [phone]

## 2015-01-27 NOTE — ED Notes (Addendum)
Pt reports woke up this morning at 0200 with h/a, then started vomiting and having diarrhea. Was given 4 mg zofran, EKG showing AFIB which is new. BP 120/82, HR 88, CBG 180. Is type 2 diabetic, has insulin pump in place.

## 2015-01-27 NOTE — Telephone Encounter (Signed)
Call form ER. New onset Afib Rate controlled. TSH found to be 0.155.   They plan starting xarelto. I asked them to have pt call to get an appt for tommrow (work in or next Tuesday) . At that time we will check free t3 and free t4 and adjust thyroid med accordingly.    ER also plans to refer to cardiology.  Will rout to [place] as FYI to expect pt's call.

## 2015-01-28 LAB — T3, FREE: T3, Free: 3.4 pg/mL (ref 2.0–4.4)

## 2015-01-28 LAB — T3: T3, Total: 107 ng/dL (ref 71–180)

## 2015-01-28 LAB — T4: T4, Total: 15.5 ug/dL — ABNORMAL HIGH (ref 4.5–12.0)

## 2015-01-28 NOTE — Telephone Encounter (Signed)
Appointment scheduled for hospital follow up 02/01/2015 at 9:00am with Dr. Diona Browner.

## 2015-02-01 ENCOUNTER — Ambulatory Visit (INDEPENDENT_AMBULATORY_CARE_PROVIDER_SITE_OTHER): Payer: 59 | Admitting: Family Medicine

## 2015-02-01 ENCOUNTER — Encounter: Payer: Self-pay | Admitting: Family Medicine

## 2015-02-01 VITALS — BP 111/71 | HR 85 | Temp 98.1°F | Ht 60.0 in | Wt 151.0 lb

## 2015-02-01 DIAGNOSIS — I480 Paroxysmal atrial fibrillation: Secondary | ICD-10-CM | POA: Insufficient documentation

## 2015-02-01 DIAGNOSIS — I4891 Unspecified atrial fibrillation: Secondary | ICD-10-CM

## 2015-02-01 DIAGNOSIS — G43009 Migraine without aura, not intractable, without status migrainosus: Secondary | ICD-10-CM

## 2015-02-01 DIAGNOSIS — E039 Hypothyroidism, unspecified: Secondary | ICD-10-CM

## 2015-02-01 MED ORDER — CHLORPHENIRAMINE-DM 4-30 MG PO TABS: ORAL_TABLET | ORAL | Status: DC

## 2015-02-01 MED ORDER — LEVOTHYROXINE SODIUM 25 MCG PO TABS: 12.5000 ug | ORAL_TABLET | Freq: Every day | ORAL | Status: DC

## 2015-02-01 MED ORDER — SALINE NASAL SPRAY 0.65 % NA SOLN: NASAL | Status: DC

## 2015-02-01 MED ORDER — ELETRIPTAN HYDROBROMIDE 40 MG PO TABS: ORAL_TABLET | ORAL | Status: DC

## 2015-02-01 NOTE — Assessment & Plan Note (Signed)
I believe she may be going in and out of afib as on exam her pulse is regualr, but she reports HR in 130s with activity at home.  Continue zarelto.. . Has appt with cards for further eval and treatment.

## 2015-02-01 NOTE — Progress Notes (Signed)
Pre visit review using our clinic review tool, if applicable. No additional management support is needed unless otherwise documented below in the visit note. 

## 2015-02-01 NOTE — Assessment & Plan Note (Signed)
Discussed meds to use safe with Zarelto  And how to treat migraine. Refilled relpax.

## 2015-02-01 NOTE — Progress Notes (Signed)
Subjective:    Patient ID: [name] [name], female    DOB: [birth year], 54 y.o.   MRN: [id number]  HPI  54 year old female with history of  Dm, tobacco abuse, CKD, hypothyroidism  She was seen in ER on 2/18 with nausea and vomiting associated with a migraine.  She was found to have new onset A fib without RVR. Rate was controlled during entire ED course. She was not having any symptoms associated with A fib. She has a [place] VASC score of 3, which would make anticoagulants recommended. Patient started on Xarelto.  history of Hypothyoidism currently on Synthroid. TSH found to be low in ER.. T3 and T4 ordered. Free t4 was high at 2.30 total t4 was high as well.  Free T3 was normal.  She had been completely off meds prior to last appt, now taking  225 mcg (has 200 mcg and 25 mcg tab) regularly.   Today she reports she feels tired. Chest mildly sore, no shortness of breath.  At home HR 89 at rest,  with activity HR 130. BP Readings from Last 3 Encounters:  02/01/15 111/71  01/27/15 110/68  01/20/15 106/66   HR 85  She has follow up appt  next week with Dr. Percival Spanish to establish.  Review of Systems  Constitutional: Negative for fever and fatigue.  HENT: Negative for ear pain.   Eyes: Negative for pain.  Respiratory: Negative for chest tightness and shortness of breath.   Cardiovascular: Negative for chest pain, palpitations and leg swelling.  Gastrointestinal: Negative for abdominal pain.  Genitourinary: Negative for dysuria.       Objective:   Physical Exam  Constitutional: Vital signs are normal. She appears well-developed and well-nourished. She is cooperative.  Non-toxic appearance. She does not appear ill. No distress.  HENT:  Head: Normocephalic.  Right Ear: Hearing, tympanic membrane, external ear and ear canal normal. Tympanic membrane is not erythematous, not retracted and not bulging.  Left Ear: Hearing, tympanic membrane, external ear and ear canal normal.  Tympanic membrane is not erythematous, not retracted and not bulging.  Nose: No mucosal edema or rhinorrhea. Right sinus exhibits no maxillary sinus tenderness and no frontal sinus tenderness. Left sinus exhibits no maxillary sinus tenderness and no frontal sinus tenderness.  Mouth/Throat: Uvula is midline, oropharynx is clear and moist and mucous membranes are normal.  Eyes: Conjunctivae, EOM and lids are normal. Pupils are equal, round, and reactive to light. Lids are everted and swept, no foreign bodies found.  Neck: Trachea normal and normal range of motion. Neck supple. Carotid bruit is not present. No thyroid mass and no thyromegaly present.  Cardiovascular: Normal rate, regular rhythm, S1 normal, S2 normal, normal heart sounds, intact distal pulses and normal pulses.  Exam reveals no gallop and no friction rub.   No murmur heard. Pulses:      Radial pulses are 2+ on the right side, and 2+ on the left side.  Today she is in sinus, rate controlled  Pulmonary/Chest: Effort normal and breath sounds normal. No tachypnea. No respiratory distress. She has no decreased breath sounds. She has no wheezes. She has no rhonchi. She has no rales.  Abdominal: Soft. Normal appearance and bowel sounds are normal. There is no tenderness.  Neurological: She is alert.  Skin: Skin is warm, dry and intact. No rash noted.  Psychiatric: Her speech is normal and behavior is normal. Judgment and thought content normal. Her mood appears not anxious. Cognition and memory are normal.  She does not exhibit a depressed mood.          Assessment & Plan:

## 2015-02-01 NOTE — Patient Instructions (Addendum)
Continue levothyroxine 200 mcg  Tabs daily but decrease levo 25 mg tabs to HALF tab a day. Total of levothyroxine daily will be 212.5 mcg. Return for TSH and free t3 and freet4 in 3 weeks. Keep appt with Dr. Percival Spanish as scheduled.  For sinus headache.. Can use tylenol , no decongestant. Can coricidin or plain mucinex for sinuses. Also can use nasal saline spray 2-3 times a day. Can use relpax for migraine.

## 2015-02-01 NOTE — Assessment & Plan Note (Signed)
Being off her meds for a while, now back on... ? Linked to new afib.  Now slightly overtreated.. Decrease levothyroxine. Recheck in 3 weeks.

## 2015-02-11 ENCOUNTER — Ambulatory Visit: Payer: 59 | Admitting: Cardiology

## 2015-02-22 ENCOUNTER — Other Ambulatory Visit: Payer: 59

## 2015-02-22 ENCOUNTER — Telehealth: Payer: Self-pay | Admitting: Family Medicine

## 2015-02-22 DIAGNOSIS — E039 Hypothyroidism, unspecified: Secondary | ICD-10-CM

## 2015-02-22 NOTE — Telephone Encounter (Signed)
-----   Message from Ellamae Sia sent at 02/16/2015  4:24 PM EST ----- Regarding: Lab orders for Tuesday, 3.15.16 Lab orders, no f/u

## 2015-02-22 NOTE — Addendum Note (Signed)
Addended by: Marchia Bond on: 02/22/2015 10:56 AM   Modules accepted: Orders

## 2015-02-24 ENCOUNTER — Ambulatory Visit (INDEPENDENT_AMBULATORY_CARE_PROVIDER_SITE_OTHER): Payer: 59 | Admitting: Cardiology

## 2015-02-24 ENCOUNTER — Encounter: Payer: Self-pay | Admitting: Cardiology

## 2015-02-24 ENCOUNTER — Ambulatory Visit: Payer: 59 | Admitting: Endocrinology

## 2015-02-24 ENCOUNTER — Other Ambulatory Visit: Payer: Self-pay | Admitting: *Deleted

## 2015-02-24 VITALS — BP 116/70 | HR 96 | Ht 59.0 in | Wt 154.0 lb

## 2015-02-24 DIAGNOSIS — R0789 Other chest pain: Secondary | ICD-10-CM

## 2015-02-24 DIAGNOSIS — R002 Palpitations: Secondary | ICD-10-CM | POA: Diagnosis not present

## 2015-02-24 MED ORDER — RIVAROXABAN 20 MG PO TABS: 20.0000 mg | ORAL_TABLET | Freq: Every day | ORAL | Status: DC

## 2015-02-24 NOTE — Patient Instructions (Signed)
Your physician recommends that you schedule a follow-up appointment after you finish your 21 day event monitor  We have ordered a stress test for you to get done

## 2015-02-24 NOTE — Progress Notes (Signed)
Cardiology Office Note   Date:  02/24/2015   ID:  [name] [name], DOB [birth year], MRN [id number]  PCP:  Eliezer Lofts, MD  Cardiologist:   Minus Breeding, MD   Chief Complaint  Patient presents with  . Atrial Fibrillation      History of Present Illness: [name] [name] is a 54 y.o. female who presents for atrial fib.  The patient was seen by [place] in 2012. She had an abnormal stress test with normal coronaries on cath. The EF was 55%. Recently in February she had a migraine and went to the emergency room. She was actually in atrial fibrillation at that time. She apparently converted to sinus rhythm. It turns out she has been off of her thyroid medicines and restarted them herself. TSH was low. Her meds have since been adjusted and her TSH is normal. She still getting some elbow dictations. She notices this when she does some activities such as climb up an incline. She might have some resting palpitations that might last for 5-10 minutes. She does get some chest discomfort but it is atypical. She does not have resting shortness of breath, PND or orthopnea. Her chest pain is short-lived, sporadic, not radiating and mild. She doesn't describe associated symptoms.    Past Medical History  Diagnosis Date  . Osteoporosis   . Intermittent vertigo   . B12 deficiency   . Peripheral neuropathy   . GERD (gastroesophageal reflux disease)   . Anxiety   . Hyperlipidemia   . Hypothyroidism   . IBS (irritable bowel syndrome)   . Hypertension   . Leg cramping     "@ night" (09/18/2013)  . Type II diabetes mellitus   . Umbilical hernia     unrepaired (09/18/2013)  . Migraine     "once q couple months" (09/18/2013)  . 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 Cesarean section  1989  . Laparoscopic assisted vaginal hysterectomy  09/23/2012    Procedure: LAPAROSCOPIC ASSISTED VAGINAL HYSTERECTOMY;  Surgeon: Linda Hedges, DO;  Location: [facility];  Service: Gynecology;  Laterality: N/A;  pull Dr Gregor Hams instrument  . Tubal ligation Bilateral 1992  . Vaginal hysterectomy  2013     Current Outpatient Prescriptions  Medication Sig Dispense Refill  . atorvastatin (LIPITOR) 80 MG tablet Take 1 tablet (80 mg total) by mouth daily. 30 tablet 11  . Canagliflozin (INVOKANA) 100 MG TABS Take 1 tablet (100 mg total) by mouth daily with breakfast. 30 tablet 6  . Chlorpheniramine-DM 4-30 MG TABS 1 tab every 6 hours as needed for nasal congestion 30 each 0  . Cholecalciferol 400 UNITS CAPS Take 1 capsule (400 Units total) by mouth 2 (two) times daily. 60 each 11  . eletriptan (RELPAX) 40 MG tablet One tablet by mouth at onset of headache. May repeat in 2 hours if headache persists or recurs. 10 tablet 0  . glucose blood (ONE TOUCH ULTRA TEST) test strip Use to check blood sugars two times a day.  Dx: 250.42 100 each 5  . Insulin Disposable Pump (V-GO 30) KIT Use for insulin delivery daily 30 kit 3  . insulin lispro (HUMALOG) 100 UNIT/ML injection Use with VGO 30 device daily 20 mL 3  . Lancets (ONETOUCH ULTRASOFT) lancets Use as instructed 100 each 12  . levothyroxine (SYNTHROID, LEVOTHROID) 200 MCG tablet TAKE 1 TABLET BY MOUTH DAILY TAKE ALONG WITH THE 25  MG DOSE. 30 tablet 5  . levothyroxine (SYNTHROID, LEVOTHROID) 25 MCG tablet Take 0.5 tablets (12.5 mcg total) by mouth daily before breakfast. 90 tablet 3  . ondansetron (ZOFRAN) 4 MG tablet Take 1 tablet (4 mg total) by mouth every 6 (six) hours. 12 tablet 0  . ondansetron (ZOFRAN-ODT) 8 MG disintegrating tablet Take 8 mg by mouth every 8 (eight) hours as needed for nausea or vomiting.    . rivaroxaban (XARELTO) 20 MG TABS tablet Take 1 tablet (20 mg total) by mouth daily with supper. 30 tablet 0  . sodium chloride (OCEAN) 0.65 % nasal spray 2 sprays per nostril  3 times daily prn congestion 30 mL 0   No current facility-administered medications for this visit.     Allergies:   Codeine    Social History:  The patient  reports that she quit smoking about 24 years ago. Her smoking use included Cigarettes. She quit after .2 years of use. She has never used smokeless tobacco. She reports that she does not drink alcohol or use illicit drugs.   Family History:  The patient's never new her father.  Her other  family history includes Alzheimer's disease in her maternal grandmother; Coronary artery disease in her maternal grandmother; Diabetes in her maternal grandmother and other; Heart attack in her maternal grandfather; Heart disease in her other; Heart failure in her brother and mother.    ROS:  Please see the history of present illness.   Otherwise, review of systems are positive for none.   All other systems are reviewed and negative.    PHYSICAL EXAM: VS:  BP 116/70 mmHg  Pulse 96  Ht '4\' 11"'  (1.499 m)  Wt 154 lb (69.854 kg)  BMI 31.09 kg/m2  LMP 08/11/2012 , BMI Body mass index is 31.09 kg/(m^2). GENERAL:  Well appearing HEENT:  Pupils equal round and reactive, fundi not visualized, oral mucosa unremarkable, poor dentition NECK:  No jugular venous distention, waveform within normal limits, carotid upstroke brisk and symmetric, no bruits, no thyromegaly LYMPHATICS:  No cervical, inguinal adenopathy LUNGS:  Clear to auscultation bilaterally BACK:  No CVA tenderness CHEST:  Unremarkable HEART:  PMI not displaced or sustained,S1 and S2 within normal limits, no S3, no S4, no clicks, no rubs, no murmurs ABD:  Flat, positive bowel sounds normal in frequency in pitch, no bruits, no rebound, no guarding, no midline pulsatile mass, no hepatomegaly, no splenomegaly, insulin pump. EXT:  2 plus pulses throughout, no edema, no cyanosis no clubbing SKIN:  No rashes no nodules NEURO:  Cranial nerves II through XII grossly intact, motor grossly intact throughout PSYCH:  Cognitively intact, oriented to person place and time    EKG:  EKG is ordered  today. The ekg ordered today demonstrates sinus rhythm, rate 96, axis within normal limits, intervals within normal limits, no acute ST-T wave changes.   Recent Labs: 01/27/2015: ALT 17; BUN 17; Creatinine 0.67; Hemoglobin 16.4*; Magnesium 1.8; Platelets 244; Potassium 4.4; Sodium 135; TSH 0.155*    Lipid Panel    Component Value Date/Time   CHOL 213* 01/04/2015 0828   TRIG 199.0* 01/04/2015 0828   HDL 44.20 01/04/2015 0828   CHOLHDL 5 01/04/2015 0828   VLDL 39.8 01/04/2015 0828   LDLCALC 129* 01/04/2015 0828   LDLDIRECT 206.0 07/27/2014 0745      Wt Readings from Last 3 Encounters:  02/24/15 154 lb (69.854 kg)  02/01/15 151 lb (68.493 kg)  01/20/15 150 lb 4 oz (68.153 kg)  Other studies Reviewed: Additional studies/ records that were reviewed today include: ED records and labs. Review of the above records demonstrates:  Please see elsewhere in the note.     ASSESSMENT AND PLAN:  CHEST PAIN:  This is atypical.  I will bring the patient back for a POET (Plain Old Exercise Test). This will allow me to screen for obstructive coronary disease, risk stratify and very importantly provide a prescription for exercise.  DM:  A1C was greater than 12.   She's now on an insulin pump.  ATRIAL FIB:  I suspect this was related to her thyroid.  [name] [name] has a CHA2DS2 - VASc score of 2 with a risk of stroke of 2.2%  I'm going to have her wear a 21 day event monitor. If there is no further evidence of fibrillation she'll be able to come off of the anticoagulant.   The patient does not have concerns regarding medicines.  The following changes have been made:  no change  Labs/ tests ordered today include: Event recorder.  POET (Plain Old Exercise Treadmill)  No orders of the defined types were placed in this encounter.     Disposition:   FU with me after the event recorder    Signed, Minus Breeding, MD  02/24/2015 4:03 PM    [facility]

## 2015-03-01 NOTE — Addendum Note (Signed)
Addended by: Vear Clock on: 03/01/2015 11:05 AM   Modules accepted: Orders

## 2015-03-08 ENCOUNTER — Other Ambulatory Visit: Payer: Self-pay | Admitting: Family Medicine

## 2015-03-08 NOTE — Telephone Encounter (Signed)
Last office visit 02/24/2015.  Last refilled 01/27/2015 for #12 with no refills.  Ok to refill?

## 2015-03-10 ENCOUNTER — Ambulatory Visit: Payer: 59 | Admitting: Endocrinology

## 2015-03-16 ENCOUNTER — Telehealth (HOSPITAL_COMMUNITY): Payer: Self-pay

## 2015-03-16 NOTE — Telephone Encounter (Signed)
Encounter complete. 

## 2015-03-17 ENCOUNTER — Telehealth (HOSPITAL_COMMUNITY): Payer: Self-pay

## 2015-03-17 NOTE — Telephone Encounter (Signed)
Encounter complete. 

## 2015-03-18 ENCOUNTER — Encounter (HOSPITAL_COMMUNITY): Payer: 59

## 2015-03-21 ENCOUNTER — Other Ambulatory Visit: Payer: Self-pay | Admitting: *Deleted

## 2015-03-21 DIAGNOSIS — R002 Palpitations: Secondary | ICD-10-CM

## 2015-03-25 ENCOUNTER — Other Ambulatory Visit: Payer: Self-pay | Admitting: *Deleted

## 2015-03-25 DIAGNOSIS — R0789 Other chest pain: Secondary | ICD-10-CM

## 2015-03-25 DIAGNOSIS — R002 Palpitations: Secondary | ICD-10-CM

## 2015-03-28 ENCOUNTER — Ambulatory Visit: Payer: 59 | Admitting: Cardiology

## 2015-03-30 ENCOUNTER — Other Ambulatory Visit: Payer: Self-pay | Admitting: *Deleted

## 2015-03-30 MED ORDER — CANAGLIFLOZIN 100 MG PO TABS: 100.0000 mg | ORAL_TABLET | Freq: Every day | ORAL | Status: DC

## 2015-04-06 ENCOUNTER — Ambulatory Visit: Payer: 59 | Admitting: Endocrinology

## 2015-04-07 ENCOUNTER — Telehealth (HOSPITAL_COMMUNITY): Payer: Self-pay

## 2015-04-07 NOTE — Telephone Encounter (Signed)
Encounter complete. 

## 2015-04-08 ENCOUNTER — Telehealth (HOSPITAL_COMMUNITY): Payer: Self-pay

## 2015-04-08 NOTE — Telephone Encounter (Signed)
Encounter complete. 

## 2015-04-12 ENCOUNTER — Encounter (HOSPITAL_COMMUNITY): Payer: 59

## 2015-04-12 ENCOUNTER — Ambulatory Visit (HOSPITAL_COMMUNITY): Admission: RE | Admit: 2015-04-12 | Payer: 59 | Source: Ambulatory Visit

## 2015-04-13 ENCOUNTER — Encounter: Payer: Self-pay | Admitting: Cardiology

## 2015-04-14 ENCOUNTER — Telehealth: Payer: Self-pay | Admitting: *Deleted

## 2015-04-14 NOTE — Telephone Encounter (Signed)
LMTCB to discuss monitor results and plan

## 2015-04-15 ENCOUNTER — Telehealth: Payer: Self-pay | Admitting: *Deleted

## 2015-04-15 NOTE — Telephone Encounter (Signed)
No answer from patient. Patient okay to stop anti-coag. No afib noted on monitor results. Per Dr.Hochrein

## 2015-04-18 ENCOUNTER — Telehealth: Payer: Self-pay | Admitting: *Deleted

## 2015-04-18 NOTE — Telephone Encounter (Signed)
LMTCB concerning monitor and medication changes

## 2015-04-18 NOTE — Telephone Encounter (Signed)
Spoke with patient. Informed her that there was no Afib noted on monitor- per Dr.Hochrein and that he would like for her to stop taking her Anti-Coagulation medication. Patient voiced understanding

## 2015-04-18 NOTE — Telephone Encounter (Signed)
-----   Message from Minus Breeding, MD sent at 04/18/2015  8:56 AM EDT ----- Did we get in touch with her to stop the warfarin.  I see we couldn't contact her by phone.

## 2015-04-20 ENCOUNTER — Telehealth (HOSPITAL_COMMUNITY): Payer: Self-pay | Admitting: *Deleted

## 2015-04-26 ENCOUNTER — Encounter: Payer: Self-pay | Admitting: Endocrinology

## 2015-04-26 ENCOUNTER — Ambulatory Visit (INDEPENDENT_AMBULATORY_CARE_PROVIDER_SITE_OTHER): Payer: 59 | Admitting: Endocrinology

## 2015-04-26 VITALS — BP 112/74 | HR 102 | Resp 16 | Ht 59.0 in | Wt 150.0 lb

## 2015-04-26 DIAGNOSIS — E785 Hyperlipidemia, unspecified: Secondary | ICD-10-CM | POA: Diagnosis not present

## 2015-04-26 DIAGNOSIS — E1165 Type 2 diabetes mellitus with hyperglycemia: Secondary | ICD-10-CM | POA: Diagnosis not present

## 2015-04-26 DIAGNOSIS — E1129 Type 2 diabetes mellitus with other diabetic kidney complication: Secondary | ICD-10-CM

## 2015-04-26 DIAGNOSIS — IMO0002 Reserved for concepts with insufficient information to code with codable children: Secondary | ICD-10-CM

## 2015-04-26 DIAGNOSIS — N182 Chronic kidney disease, stage 2 (mild): Secondary | ICD-10-CM | POA: Diagnosis not present

## 2015-04-26 DIAGNOSIS — I10 Essential (primary) hypertension: Secondary | ICD-10-CM

## 2015-04-26 DIAGNOSIS — E039 Hypothyroidism, unspecified: Secondary | ICD-10-CM | POA: Diagnosis not present

## 2015-04-26 LAB — COMPREHENSIVE METABOLIC PANEL
ALBUMIN: 4.3 g/dL (ref 3.5–5.2)
ALK PHOS: 115 U/L (ref 39–117)
ALT: 15 U/L (ref 0–35)
AST: 12 U/L (ref 0–37)
BUN: 10 mg/dL (ref 6–23)
CHLORIDE: 96 meq/L (ref 96–112)
CO2: 26 mEq/L (ref 19–32)
Calcium: 9.6 mg/dL (ref 8.4–10.5)
Creatinine, Ser: 0.79 mg/dL (ref 0.40–1.20)
GFR: 81.28 mL/min (ref >60.00)
Glucose, Bld: 373 mg/dL — ABNORMAL HIGH (ref 70–99)
POTASSIUM: 4.3 meq/L (ref 3.5–5.1)
Sodium: 131 mEq/L — ABNORMAL LOW (ref 135–145)
Total Bilirubin: 0.4 mg/dL (ref 0.2–1.2)
Total Protein: 6.8 g/dL (ref 6.0–8.3)

## 2015-04-26 LAB — T4, FREE: Free T4: 0.26 ng/dL — ABNORMAL LOW (ref 0.60–1.60)

## 2015-04-26 LAB — TSH: TSH: 31.59 u[IU]/mL — ABNORMAL HIGH (ref 0.35–4.50)

## 2015-04-26 LAB — HEMOGLOBIN A1C: Hgb A1c MFr Bld: 12 % — ABNORMAL HIGH (ref 4.6–6.5)

## 2015-04-26 MED ORDER — GLUCOSE BLOOD VI STRP: ORAL_STRIP | Status: DC

## 2015-04-26 NOTE — Assessment & Plan Note (Signed)
BP at target today. Update urine MA.

## 2015-04-26 NOTE — Progress Notes (Signed)
Pre visit review using our clinic review tool, if applicable. No additional management support is needed unless otherwise documented below in the visit note. 

## 2015-04-26 NOTE — Assessment & Plan Note (Signed)
Followed by PCP. Now taking statin regularly.

## 2015-04-26 NOTE — Assessment & Plan Note (Signed)
Clinically euthyroid today. Recent Afib. Prior hx non compliance. Now taking LT4 correctly. Last dose change in levothyroxine ~02/01/15. Check levels now and adjust dose based on levels from today

## 2015-04-26 NOTE — Progress Notes (Signed)
Reason for visit-  [name] [name] is a 54 y.o.-year-old female, here for management of Type 2 diabetes, uncontrolled, with complications ( stage 2 CKD, neuropathy). Last visit with me  Feb 2016.   HPI- Diagnosed with diabetes in 2012.    -Tried Metformin and states that "it wasn't working". Probably affected her IBS symptoms as well, but doesn't recall specifically .  -Then tried glipizide and that " didn't work either".  -Has been on insulin since 6 + months ~2015. -Started on lantus, but having problems with compliance as having to force herself to take the shot. Doesn't like injectable therapy. Trying to get her husband to give her the injections.  - Has been on Novolog before but doesn't like the shots.  -Switched from Lantus to [place] with Humalog Jan 2016   * recent ER visit for headache, was found to be in afib, follows with cards, taken off xarelto now. Going for second opinion as still having episodic symptoms  Pt is currently on a regimen of: - VGO 30 with 4 clicks with each meal.  - Invokana 100 mg daily ( start 08/2014)  * likes the VGO and tolerating well. Inserting the VGO device herself. Developing some redness around sticky tape, no itching>>now with use of cream prior to insertion, doing well  Last hemoglobin A1c was uncontrolled. Recent improved compliance: Lab Results  Component Value Date   HGBA1C 12.8* 01/04/2015   HGBA1C 12.2* 07/27/2014   HGBA1C 9.4* 04/14/2014     Pt checks her sugars 1-3 x a day, till lately when she lost here meter during home renovation . Uses One Touch ultra 2 glucometer. By recall -.  PREMEAL Breakfast Lunch Dinner Bedtime Overall  Glucose range:     300-400, 89  Mean/median:        Hypoglycemia-  No lows recently.  One episode of presyncope recently, not sure whether positional versus low sugar  Dietary habits- eats twice daily ( lunch and dinner) .  Weakness is mountain dew - used to drink 12 pack  A day on some days when she  feels more thirsty. Now down to 2 a day.  Now trying to drink lemon water.    Exercise- walks every other day- 20 minutes , active around the house and  babysitting. Taking care of multiple family members Weight -  Wt Readings from Last 3 Encounters:  04/26/15 150 lb (68.04 kg)  02/24/15 154 lb (69.854 kg)  02/01/15 151 lb (68.493 kg)    Diabetes Complications- Known complications Nephropathy- Yes  Stage 2 CKD, last BUN/creatinine- No recent urine MA.-being deferred due to high A1c Lab Results  Component Value Date   BUN 17 01/27/2015   CREATININE 0.67 01/27/2015   Lab Results  Component Value Date   MICROALBUR 0.3 04/16/2011   Lab Results  Component Value Date   GFR 82.59 01/04/2015    Retinopathy- No, Last DEE was last year, developing cataracts-" eye doctor wants to see her when her sugars are better" Neuropathy- occ numbness and tingling in her feet. Known neuropathy.  Associated history - No history of CAD or prior stroke.   Hypothyroidism- Has hypothyroidism and is bring treated with levothyroxine by Her PCP. Now taking it as directed. She requests her follow up labs to be done by me as  Her levels have not been controlled. Recently, her levels were low, but in hospital 2/18, TSH was 0.155, following which her dose of levothyroxine was reduced to 212.5 mcg daily instead  of 225 mcg daily. She is taking this as directed and not missed any doses.   her last levels were   Lab Results  Component Value Date   TSH 0.155* 01/27/2015   TSH 13.32* 01/04/2015   TSH 23.39* 10/13/2013   FREET4 2.30* 01/27/2015   FREET4 0.57* 01/04/2015   FREET4 0.56* 07/27/2014   Lab Results  Component Value Date   T4TOTAL 15.5* 01/27/2015   Lab Results  Component Value Date   T3FREE 3.4 01/27/2015   T3FREE 3.0 01/04/2015   T3FREE 2.6 07/27/2014    Denies mental confusion, cold intolerance, skin changes and constipation. Stable hot flashes. Denies compressive symptoms. Knows how to  correctly take her levothyroxine. Recent thyroid levels were showing low TSH with elevated T4 and free T4 , following which dose was adjusted as above. Not on amiodarone. Off Xarelto.   Hyperlipidemia-  her last set of lipids were- Currently on Lipitor and not controlled. Tolerating well. Recently  compliant again. Was also on zetia before. Being managed by her PCP.    Lab Results  Component Value Date   CHOL 213* 01/04/2015   HDL 44.20 01/04/2015   LDLCALC 129* 01/04/2015   LDLDIRECT 206.0 07/27/2014   TRIG 199.0* 01/04/2015   CHOLHDL 5 01/04/2015    Blood Pressure- Patient's blood pressure is well controlled today.  Reviewed past medical history, medications and allergies. Updated if necessary.   Current Outpatient Prescriptions on File Prior to Visit  Medication Sig Dispense Refill  . atorvastatin (LIPITOR) 80 MG tablet Take 1 tablet (80 mg total) by mouth daily. 30 tablet 11  . canagliflozin (INVOKANA) 100 MG TABS tablet Take 1 tablet (100 mg total) by mouth daily. 30 tablet 5  . Chlorpheniramine-DM 4-30 MG TABS 1 tab every 6 hours as needed for nasal congestion 30 each 0  . Cholecalciferol 400 UNITS CAPS Take 1 capsule (400 Units total) by mouth 2 (two) times daily. 60 each 11  . eletriptan (RELPAX) 40 MG tablet One tablet by mouth at onset of headache. May repeat in 2 hours if headache persists or recurs. 10 tablet 0  . Insulin Disposable Pump (V-GO 30) KIT Use for insulin delivery daily 30 kit 3  . insulin lispro (HUMALOG) 100 UNIT/ML injection Use with VGO 30 device daily 20 mL 3  . Lancets (ONETOUCH ULTRASOFT) lancets Use as instructed 100 each 12  . levothyroxine (SYNTHROID, LEVOTHROID) 200 MCG tablet TAKE 1 TABLET BY MOUTH DAILY TAKE ALONG WITH THE 25 MG DOSE. 30 tablet 5  . levothyroxine (SYNTHROID, LEVOTHROID) 25 MCG tablet Take 0.5 tablets (12.5 mcg total) by mouth daily before breakfast. 90 tablet 3  . ondansetron (ZOFRAN) 4 MG tablet TAKE 1 TABLET BY MOUTH EVERY 6 HOURS  30 tablet 0  . ondansetron (ZOFRAN-ODT) 8 MG disintegrating tablet Take 8 mg by mouth every 8 (eight) hours as needed for nausea or vomiting.    . sodium chloride (OCEAN) 0.65 % nasal spray 2 sprays per nostril  3 times daily prn congestion 30 mL 0   No current facility-administered medications on file prior to visit.   Allergies  Allergen Reactions  . Codeine Hives, Anxiety and Other (See Comments)    REACTION: N' \T' \ V and hyper. Pt can take Percocet.     Review of Systems- [ x ]  Complains of    [  ]  denies [  ] Recent weight change [ x ]  Fatigue [  ] polydipsia [ x ] polyuria [  ]  nocturia [  ]  vision difficulty [  ] chest pain [  ] shortness of breath [  ] leg swelling [  ] cough [  ] nausea/vomiting [  ] diarrhea [  ] constipation [  ] abdominal pain [  ]  tingling/numbness in extremities [  ]  concern with feet ( wounds/sores)   PE: BP 112/74 mmHg  Pulse 102  Resp 16  Ht '4\' 11"'  (1.499 m)  Wt 150 lb (68.04 kg)  BMI 30.28 kg/m2  SpO2 95%  LMP 08/11/2012 Wt Readings from Last 3 Encounters:  04/26/15 150 lb (68.04 kg)  02/24/15 154 lb (69.854 kg)  02/01/15 151 lb (68.493 kg)   HEENT: [place]/AT, EOMI, no icterus, no proptosis, no chemosis, no mild lid lag, no retraction, eyes close completely Neck: thyroid gland - smooth, non-tender, no erythema, no tracheal deviation; negative Pemberton's sign; no lymphadenopathy; no bruits Lungs: good air entry, clear bilaterally Heart: S1&S2 normal, regular rate & rhythm; no murmurs, rubs or gallops Ext: no tremor in hands bilaterally, no edema, 2+ DP/PT pulses, good muscle mass Neuro: normal gait, 2+ reflexes bilaterally, normal 5/5 strength, no proximal myopathy      ASSESSMENT AND PLAN:  Problem List Items Addressed This Visit      Cardiovascular and Mediastinum   Essential hypertension    BP at target today. Update urine MA.       Relevant Orders   Comprehensive metabolic panel   Hemoglobin A1c   Microalbumin /  creatinine urine ratio   TSH   T4, free     Endocrine   Hypothyroidism    Clinically euthyroid today. Recent Afib. Prior hx non compliance. Now taking LT4 correctly. Last dose change in levothyroxine ~02/01/15. Check levels now and adjust dose based on levels from today      Relevant Orders   Comprehensive metabolic panel   Hemoglobin A1c   Microalbumin / creatinine urine ratio   TSH   T4, free   Type 2 diabetes mellitus, uncontrolled, with renal complications - Primary    Last A1c, is poorly controlled.She has been more compliant since past visit with VGO device and doesn't wish to come off it for now.  She was given a One touch mini meter and asked to check her sugars 4 x daily for now. She will call in her readings in 2-3 days. Based on this will decide whether to move upto VGO 40 or Stay with VGo 30 and titrate up the meal time clicks.  Update A1c as well today. Continue current Invokana.  Continue current VgO settings- refills this Friday based on results of sugar testing  Notify if sugars stay high or low.  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 Now taking statin regularly.         Relevant Orders   Comprehensive metabolic panel  Hemoglobin A1c   Microalbumin / creatinine urine ratio   TSH   T4, free          - Return to clinic in  4 weeks with sugar log/meter.   Merial Moritz [facility] 04/26/2015 3:35 PM

## 2015-04-26 NOTE — Assessment & Plan Note (Signed)
Last A1c, is poorly controlled.She has been more compliant since past visit with VGO device and doesn't wish to come off it for now.  She was given a One touch mini meter and asked to check her sugars 4 x daily for now. She will call in her readings in 2-3 days. Based on this will decide whether to move upto VGO 40 or Stay with VGo 30 and titrate up the meal time clicks.  Update A1c as well today. Continue current Invokana.  Continue current VgO settings- refills this Friday based on results of sugar testing  Notify if sugars stay high or low.  RTC 4 weeks

## 2015-04-26 NOTE — Patient Instructions (Signed)
One touch mini meter given.  Check sugars 4 x daily ( before each meal and at bedtime).  Record them in a log book and bring that/meter to next appointment.  Call me this Friday with your sugar log, so that we can decide whether to go up on the VGo or increase the clicks Stop drinking [place] today. Please come back for a follow-up appointment in 1 month.

## 2015-04-26 NOTE — Assessment & Plan Note (Signed)
Update CMP today

## 2015-04-27 ENCOUNTER — Other Ambulatory Visit: Payer: Self-pay | Admitting: Endocrinology

## 2015-04-27 LAB — MICROALBUMIN / CREATININE URINE RATIO
Creatinine,U: 60.5 mg/dL
MICROALB/CREAT RATIO: 2.6 mg/g (ref 0.0–30.0)
Microalb, Ur: 1.6 mg/dL (ref 0.0–1.9)

## 2015-04-27 MED ORDER — LEVOTHYROXINE SODIUM 25 MCG PO TABS: 25.0000 ug | ORAL_TABLET | Freq: Every day | ORAL | Status: DC

## 2015-04-29 ENCOUNTER — Other Ambulatory Visit: Payer: Self-pay | Admitting: Family Medicine

## 2015-04-29 ENCOUNTER — Telehealth: Payer: Self-pay | Admitting: *Deleted

## 2015-04-29 MED ORDER — ONETOUCH ULTRASOFT LANCETS MISC: Status: DC

## 2015-04-29 NOTE — Telephone Encounter (Signed)
Pt called with sugar readings. Also needed refill lancets. Rx sent to pharmacy by escript  5/17 before dinner       297 5/17 bedtime               207 5/18 before breakfast 195 5/18 before lunch       143 5/18 before dinner      260 5/18 bedtime              182 5/19 before breakfast 294 5/19 before lunch       215 5/19 before dinner     334 5/19 bedtime             399 5/20 before breakfast 315 5/20 before lunch       348

## 2015-04-30 ENCOUNTER — Encounter: Payer: Self-pay | Admitting: Endocrinology

## 2015-04-30 NOTE — Telephone Encounter (Signed)
Discussed with pt. Increase to 6 clicks with each meal on VGO 30. At next visit in June, if sugars are still elevated- then will increase to VGO 40. Patient agreeable to the plan. She stated that she has enough refills on VGO 30 to last till June

## 2015-05-05 ENCOUNTER — Telehealth: Payer: Self-pay

## 2015-05-05 ENCOUNTER — Other Ambulatory Visit: Payer: Self-pay

## 2015-05-05 MED ORDER — V-GO 30 KIT: PACK | Status: DC

## 2015-05-05 NOTE — Telephone Encounter (Signed)
Patient called to check on what Dr. Howell Rucks would like her to do because she increased her Insulin per orders to VGO 30 6 clicks with each meal and doesn't have enough to get her to her follow up appt.  Please advise?

## 2015-05-05 NOTE — Telephone Encounter (Signed)
Patient returned my call, she verbalized understanding of the plan for the next week.  She will come to pick up the sample VGO40 in the AM due to being to far away today.  Verbalized orders to use only 4 clicks at each meal and record blood sugars 3-4 times a day. She will call in numbers on Tuesday after the holiday.

## 2015-05-05 NOTE — Telephone Encounter (Signed)
Lets do this instead-  Please ask her to pick up the trial pack of VGO40. The instructions with filling this with insulin are just the same as VGO 30, this just takes little more insulin to fill ( 10 units)  Please ask her to try this pack and then we can see how her sugars are in about 3 days. If she is seeing lot of lows, then would need to go back to VGO30 (Rx already sent for this, don't pick this up yet).  Please ask her to use VGO 40 from tomorrow and use 4 clicks with each meal.   Check sugars 3-4 x daily and call back this Mon/tuesday with readings.  Thanks for helping her.

## 2015-05-05 NOTE — Telephone Encounter (Signed)
Please refill VGO 30 device for next month- when I see her in follow up in few more weeks, we can go upto VGO 40 if needed.

## 2015-05-05 NOTE — Telephone Encounter (Signed)
Attempted to call patient back, left a VM that prescription was sent to the [facility] for this next month until she see's Dr. Howell Rucks.

## 2015-05-05 NOTE — Telephone Encounter (Signed)
Patient called back with concerns that she may just need the VGO 40 as her lowest BS was 147 yesterday and her highest was 421.  Last night it was 268 and then 339 at bedtime.  Concerned with having to change it prior to the 24 hours and that her sugars are steadily staying high.  Please advise?

## 2015-05-05 NOTE — Telephone Encounter (Signed)
Attempted to call patient back.  Left VM to return my call.

## 2015-05-06 NOTE — Telephone Encounter (Signed)
Patient came and picked up [place] 40 and I reviewed again the orders for use and the importance of calling on Tuesday with her recorded Blood sugar checks.

## 2015-05-10 ENCOUNTER — Other Ambulatory Visit: Payer: Self-pay | Admitting: Endocrinology

## 2015-05-10 ENCOUNTER — Telehealth: Payer: Self-pay

## 2015-05-10 MED ORDER — V-GO 40 KIT: PACK | Status: DC

## 2015-05-10 NOTE — Telephone Encounter (Signed)
These are starting to look better. I think she can do better with dietary choices- keep carb content ( bread/pasta/rice) low.  Increase to 6 clicks with each meal on VGO 40. I will send in her Rx for VGO 40 now. thanks

## 2015-05-10 NOTE — Telephone Encounter (Signed)
15 carbs for snacks and no more than 50 carbs for each of her meals.

## 2015-05-10 NOTE — Telephone Encounter (Signed)
Attempted to call patient, left a detailed VM to answer her question about carb intake.

## 2015-05-10 NOTE — Telephone Encounter (Signed)
Patient returned phone call.  Verbalized understanding of new orders with the VGO 40.  Patient understands changing Carb intake, would like to know a number of Carbs to stay around that you would suggest?  Please advise.

## 2015-05-10 NOTE — Telephone Encounter (Signed)
Patient called to report her blood glucose Readings since changing to the VGO 40.  Changed to the VGO 40 on Friday evening.   Readings were as follows:  Saturday May 28th, 2016 AM 203, Lunch 187, Dinner 111 and bedtime 283.  Sunday May 29th, 2016 AM 170, Lunch 242, Dinner 199, and Bedtime 278.  Monday May 30th, 2016 AM 258, Lunch 330, Dinner 369, Bedtime 367.   Tuesday May 31st, 2016 AM 295, Lunch 196.    Please advise?  Lavella Lemons

## 2015-05-25 ENCOUNTER — Other Ambulatory Visit: Payer: Self-pay | Admitting: Endocrinology

## 2015-05-27 ENCOUNTER — Other Ambulatory Visit: Payer: Self-pay

## 2015-05-27 ENCOUNTER — Other Ambulatory Visit: Payer: Self-pay | Admitting: Family Medicine

## 2015-05-27 MED ORDER — INSULIN LISPRO 100 UNIT/ML ~~LOC~~ SOLN: SUBCUTANEOUS | Status: DC

## 2015-05-31 ENCOUNTER — Ambulatory Visit: Payer: 59 | Admitting: Endocrinology

## 2015-06-08 ENCOUNTER — Ambulatory Visit (INDEPENDENT_AMBULATORY_CARE_PROVIDER_SITE_OTHER): Payer: 59 | Admitting: Endocrinology

## 2015-06-08 ENCOUNTER — Encounter: Payer: Self-pay | Admitting: Endocrinology

## 2015-06-08 VITALS — BP 110/70 | HR 88 | Resp 12 | Ht 59.0 in | Wt 154.8 lb

## 2015-06-08 DIAGNOSIS — I10 Essential (primary) hypertension: Secondary | ICD-10-CM | POA: Diagnosis not present

## 2015-06-08 DIAGNOSIS — N182 Chronic kidney disease, stage 2 (mild): Secondary | ICD-10-CM | POA: Diagnosis not present

## 2015-06-08 DIAGNOSIS — E1129 Type 2 diabetes mellitus with other diabetic kidney complication: Secondary | ICD-10-CM | POA: Diagnosis not present

## 2015-06-08 DIAGNOSIS — IMO0002 Reserved for concepts with insufficient information to code with codable children: Secondary | ICD-10-CM

## 2015-06-08 DIAGNOSIS — E039 Hypothyroidism, unspecified: Secondary | ICD-10-CM

## 2015-06-08 DIAGNOSIS — E785 Hyperlipidemia, unspecified: Secondary | ICD-10-CM

## 2015-06-08 DIAGNOSIS — E1165 Type 2 diabetes mellitus with hyperglycemia: Secondary | ICD-10-CM

## 2015-06-08 MED ORDER — CANAGLIFLOZIN 300 MG PO TABS: 300.0000 mg | ORAL_TABLET | Freq: Every day | ORAL | Status: DC

## 2015-06-08 NOTE — Progress Notes (Signed)
Reason for visit-  [name] [name] is a 54 y.o.-year-old female, here for management of Type 2 diabetes, uncontrolled, with complications ( stage 2 CKD, neuropathy). Last visit with me  May 2016.   HPI- Diagnosed with diabetes in 2012.    -Tried Metformin and states that "it wasn't working". Probably affected her IBS symptoms as well, but doesn't recall specifically .  -Then tried glipizide and that " didn't work either".  -Has been on insulin since 6 + months ~2015. -Started on lantus, but having problems with compliance as having to force herself to take the shot. Doesn't like injectable therapy. Trying to get her husband to give her the injections.  - Has been on Novolog before but doesn't like the shots.  -Switched from Lantus to [place] with Humalog Jan 2016   Pt is currently on a regimen of: - VGO 40 with 4 clicks with each meal ( increased since last visit) - Invokana 100 mg daily ( start 08/2014)  * likes the VGO and tolerating well. Inserting the VGO device herself.   Last hemoglobin A1c was uncontrolled. Recent improved compliance: Lab Results  Component Value Date   HGBA1C 12.0* 04/26/2015   HGBA1C 12.8* 01/04/2015   HGBA1C 12.2* 07/27/2014     Pt checks her sugars 2-3 x a day, till lately when she couldn't check as much due to her beach vacation . Uses One Touch ultra 2 glucometer. By sugar log -.  PREMEAL Breakfast Lunch Dinner Bedtime Overall  Glucose range: 198-368 111-289 267-446    Mean/median:        Hypoglycemia-  No lows recently.   Dietary habits- eats twice daily ( lunch and dinner) .  Weakness is mountain dew - used to drink 12 pack  A day on some days when she feels more thirsty. Now down to 3 times weekly.  Now trying to drink lemon water.    Exercise- walks every other day- 20 minutes , active around the house and  babysitting. Taking care of multiple family members Weight -  Wt Readings from Last 3 Encounters:  06/08/15 154 lb 12 oz (70.194 kg)   04/26/15 150 lb (68.04 kg)  02/24/15 154 lb (69.854 kg)    Diabetes Complications- Known complications Nephropathy- Yes  Stage 2 CKD, last BUN/creatinine-  Lab Results  Component Value Date   BUN 10 04/26/2015   CREATININE 0.79 04/26/2015   Lab Results  Component Value Date   MICROALBUR 1.6 04/26/2015   Lab Results  Component Value Date   GFR 81.28 04/26/2015    Retinopathy- No, Last DEE was last year, developing cataracts-" eye doctor wants to see her when her sugars are better" Neuropathy- occ numbness and tingling in her feet. Known neuropathy.  Associated history - No history of CAD or prior stroke.   Hypothyroidism- Has hypothyroidism and is bring treated with levothyroxine by Her PCP. Now taking it as directed.  Her levels were low, but in hospital 2/18, TSH was 0.155, following which her dose of levothyroxine was reduced to 212.5 mcg daily instead of 225 mcg daily. I increased that back to 225 mcg daily 04/26/15 and She is taking this as directed and not missed any doses.   her last levels were   Lab Results  Component Value Date   TSH 31.59* 04/26/2015   TSH 0.155* 01/27/2015   TSH 13.32* 01/04/2015   FREET4 0.26* 04/26/2015   FREET4 2.30* 01/27/2015   FREET4 0.57* 01/04/2015   Lab Results  Component  Value Date   T4TOTAL 15.5* 01/27/2015   Lab Results  Component Value Date   T3FREE 3.4 01/27/2015   T3FREE 3.0 01/04/2015   T3FREE 2.6 07/27/2014    Denies mental confusion, cold intolerance, skin changes and constipation. Stable hot flashes. Denies compressive symptoms. Knows how to correctly take her levothyroxine. Recent thyroid levels were showing low TSH with elevated T4 and free T4 , following which dose was adjusted as above. Not on amiodarone. Off Xarelto.   Hyperlipidemia-  her last set of lipids were- Currently on Lipitor and not controlled. Tolerating well. Recently  compliant again. Was also on zetia before. Being managed by her PCP.    Lab Results   Component Value Date   CHOL 213* 01/04/2015   HDL 44.20 01/04/2015   LDLCALC 129* 01/04/2015   LDLDIRECT 206.0 07/27/2014   TRIG 199.0* 01/04/2015   CHOLHDL 5 01/04/2015    Blood Pressure- Patient's blood pressure is well controlled today.  Reviewed past medical history, medications and allergies. Updated if necessary.   Current Outpatient Prescriptions on File Prior to Visit  Medication Sig Dispense Refill  . atorvastatin (LIPITOR) 80 MG tablet TAKE 1 TABLET (80 MG TOTAL) BY MOUTH DAILY. 30 tablet 5  . Chlorpheniramine-DM 4-30 MG TABS 1 tab every 6 hours as needed for nasal congestion 30 each 0  . Cholecalciferol 400 UNITS CAPS Take 1 capsule (400 Units total) by mouth 2 (two) times daily. 60 each 11  . eletriptan (RELPAX) 40 MG tablet One tablet by mouth at onset of headache. May repeat in 2 hours if headache persists or recurs. 10 tablet 0  . glucose blood (ONE TOUCH ULTRA TEST) test strip Use to check blood sugars four times a day.  Dx: 250.42 120 each 5  . Insulin Disposable Pump (V-GO 40) KIT Use for insulin delivery daily 30 kit 3  . insulin lispro (HUMALOG) 100 UNIT/ML injection Use with VGO 30 device daily 20 mL 3  . Lancets (ONETOUCH ULTRASOFT) lancets Use as instructed 100 each 12  . levothyroxine (SYNTHROID, LEVOTHROID) 200 MCG tablet TAKE 1 TABLET BY MOUTH DAILY TAKE ALONG WITH THE 25 MG DOSE. 30 tablet 5  . levothyroxine (SYNTHROID, LEVOTHROID) 25 MCG tablet Take 1 tablet (25 mcg total) by mouth daily before breakfast. In addition to 200 mcg daily for TDD 225 mcg 30 tablet 2  . ondansetron (ZOFRAN) 4 MG tablet TAKE 1 TABLET BY MOUTH EVERY 6 HOURS 30 tablet 0  . ondansetron (ZOFRAN-ODT) 8 MG disintegrating tablet Take 8 mg by mouth every 8 (eight) hours as needed for nausea or vomiting.    . sodium chloride (OCEAN) 0.65 % nasal spray 2 sprays per nostril  3 times daily prn congestion 30 mL 0   No current facility-administered medications on file prior to visit.    Allergies  Allergen Reactions  . Codeine Hives, Anxiety and Other (See Comments)    REACTION: N' \T' \ V and hyper. Pt can take Percocet.     Review of Systems- [ x ]  Complains of    [  ]  denies [  ] Recent weight change [  ]  Fatigue [  ] polydipsia [  ] polyuria [  ]  nocturia [  ]  vision difficulty [  ] chest pain [  ] shortness of breath [  ] leg swelling [  ] cough [  ] nausea/vomiting [  ] diarrhea [  ] constipation [  ] abdominal pain [  ]  tingling/numbness in extremities [  ]  concern with feet ( wounds/sores)   PE: BP 110/70 mmHg  Pulse 88  Resp 12  Ht '4\' 11"'  (1.499 m)  Wt 154 lb 12 oz (70.194 kg)  BMI 31.24 kg/m2  SpO2 97%  LMP 08/11/2012 Wt Readings from Last 3 Encounters:  06/08/15 154 lb 12 oz (70.194 kg)  04/26/15 150 lb (68.04 kg)  02/24/15 154 lb (69.854 kg)   HEENT: [place]/AT, EOMI, no icterus, no proptosis, no chemosis, no mild lid lag, no retraction, eyes close completely Neck: thyroid gland - smooth, non-tender, no erythema, no tracheal deviation; negative Pemberton's sign; no lymphadenopathy; no bruits Lungs: good air entry, clear bilaterally Heart: S1&S2 normal, regular rate & rhythm; no murmurs, rubs or gallops Ext: no tremor in hands bilaterally, no edema, 2+ DP/PT pulses, good muscle mass Neuro: normal gait, 2+ reflexes bilaterally, normal 5/5 strength, no proximal myopathy      ASSESSMENT AND PLAN:  Problem List Items Addressed This Visit      Cardiovascular and Mediastinum   Essential hypertension    BP at target today. Updated urine MA May 2016.         Relevant Orders   TSH   T4, free   Basic metabolic panel     Endocrine   Hypothyroidism    Clinically euthyroid today. Recent Afib. Prior hx non compliance. Now taking LT4 correctly. Last dose change in levothyroxine ~04/26/15. Check levels next week- 10 days and adjust dose based on levels.        Relevant Orders   TSH   T4, free   Basic metabolic panel   Type 2 diabetes  mellitus, uncontrolled, with renal complications - Primary    Last A1c, is poorly controlled.She has been more compliant since past visit with VGO device and doesn't wish to come off it for now.  She will continue on VGO 40 and increase to 6 clicks with each meal. Increase Invokana to 300 mg daily and recheck GFR in 10 days. Stay hydrated with water, try to get off [place]. Dew.  Doesn't want to add any other medications. Explained that if sugars are not at goal, then will need to add another medication like GLP-1.  Notify if sugars stay high or low.                   Relevant Medications   canagliflozin (INVOKANA) 300 MG TABS tablet   Other Relevant Orders   TSH   T4, free   Basic metabolic panel     Genitourinary   Kidney disease, chronic, stage 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)    GFR stable recently                 Relevant Orders   TSH   T4, free   Basic metabolic panel     Other   Hyperlipidemia    Followed by PCP. Now taking statin regularly.           Relevant Orders   TSH   T4, free   Basic metabolic panel          - Return to clinic in  6 weeks with sugar log/meter. Explained that [place] am [place] and discussed follow up care. She wishes to follow up with Dr Cruzita Lederer.   Magdalena Skilton [facility] 06/08/2015 8:43 AM

## 2015-06-08 NOTE — Assessment & Plan Note (Signed)
GFR stable recently

## 2015-06-08 NOTE — Progress Notes (Signed)
Pre visit review using our clinic review tool, if applicable. No additional management support is needed unless otherwise documented below in the visit note. 

## 2015-06-08 NOTE — Assessment & Plan Note (Signed)
Clinically euthyroid today. Recent Afib. Prior hx non compliance. Now taking LT4 correctly. Last dose change in levothyroxine ~04/26/15. Check levels next week- 10 days and adjust dose based on levels.

## 2015-06-08 NOTE — Assessment & Plan Note (Signed)
Followed by PCP. Now taking statin regularly.

## 2015-06-08 NOTE — Assessment & Plan Note (Signed)
BP at target today. Updated urine MA May 2016.

## 2015-06-08 NOTE — Assessment & Plan Note (Signed)
Last A1c, is poorly controlled.She has been more compliant since past visit with VGO device and doesn't wish to come off it for now.  She will continue on VGO 40 and increase to 6 clicks with each meal. Increase Invokana to 300 mg daily and recheck GFR in 10 days. Stay hydrated with water, try to get off [place]. Dew.  Doesn't want to add any other medications. Explained that if sugars are not at goal, then will need to add another medication like GLP-1.  Notify if sugars stay high or low.

## 2015-06-08 NOTE — Patient Instructions (Signed)
Check sugars 3 times daily ( fasting and premeal readings at alternating times, or at bedtime).  Record them in a sugar log and bring log and meter to next appointment.   Increase Invokana to 300 mg daily. Increase VGO 40 to 6 clicks with each meal. Return in 10 days for lab visit for thyroid and GFR. Please come back for a follow-up appointment in 6 weeks [place] with Dr Cruzita Lederer

## 2015-06-21 ENCOUNTER — Other Ambulatory Visit: Payer: 59

## 2015-07-22 ENCOUNTER — Ambulatory Visit: Payer: 59 | Admitting: Internal Medicine

## 2015-09-06 ENCOUNTER — Encounter: Payer: Self-pay | Admitting: Internal Medicine

## 2015-09-06 ENCOUNTER — Ambulatory Visit (INDEPENDENT_AMBULATORY_CARE_PROVIDER_SITE_OTHER): Payer: 59 | Admitting: Internal Medicine

## 2015-09-06 ENCOUNTER — Other Ambulatory Visit (INDEPENDENT_AMBULATORY_CARE_PROVIDER_SITE_OTHER): Payer: 59 | Admitting: *Deleted

## 2015-09-06 VITALS — BP 114/72 | HR 98 | Temp 97.7°F | Resp 12 | Wt 153.6 lb

## 2015-09-06 DIAGNOSIS — Z23 Encounter for immunization: Secondary | ICD-10-CM | POA: Diagnosis not present

## 2015-09-06 DIAGNOSIS — E1165 Type 2 diabetes mellitus with hyperglycemia: Secondary | ICD-10-CM | POA: Diagnosis not present

## 2015-09-06 MED ORDER — ONETOUCH DELICA LANCETS FINE MISC: Status: DC

## 2015-09-06 MED ORDER — INSULIN LISPRO 100 UNIT/ML (KWIKPEN): 12.0000 [IU] | PEN_INJECTOR | Freq: Three times a day (TID) | SUBCUTANEOUS | Status: DC

## 2015-09-06 MED ORDER — INSULIN PEN NEEDLE 32G X 4 MM MISC: Status: DC

## 2015-09-06 MED ORDER — INSULIN GLARGINE 100 UNIT/ML SOLOSTAR PEN: 40.0000 [IU] | PEN_INJECTOR | Freq: Every day | SUBCUTANEOUS | Status: DC

## 2015-09-06 MED ORDER — METFORMIN HCL 500 MG PO TABS: 500.0000 mg | ORAL_TABLET | Freq: Two times a day (BID) | ORAL | Status: DC

## 2015-09-06 MED ORDER — GLUCOSE BLOOD VI STRP: ORAL_STRIP | Status: DC

## 2015-09-06 NOTE — Patient Instructions (Signed)
Please stop VGo. Please start Lantus 40 units at bedtime. Start NovoLog: - 12 units before a smaller meal - 16 units before a larger meal Continue Invokana 300 mg in am. Start Metformin 500 mg 2x a day with meals.   Please continue the same dose of Levothyroxine: 212 mcg daily.  Take the thyroid hormone every day, with water, >30 minutes before breakfast, separated by >4 hours from acid reflux medications, calcium, iron, multivitamins.  Please return in 1.5 months with your sugar log.   PATIENT INSTRUCTIONS FOR TYPE 2 DIABETES:  **Please join MyChart!** - see attached instructions about how to join if you have not done so already.  DIET AND EXERCISE Diet and exercise is an important part of diabetic treatment.  We recommended aerobic exercise in the form of brisk walking (working between 40-60% of maximal aerobic capacity, similar to brisk walking) for 150 minutes per week (such as 30 minutes five days per week) along with 3 times per week performing 'resistance' training (using various gauge rubber tubes with handles) 5-10 exercises involving the major muscle groups (upper body, lower body and core) performing 10-15 repetitions (or near fatigue) each exercise. Start at half the above goal but build slowly to reach the above goals. If limited by weight, joint pain, or disability, we recommend daily walking in a swimming pool with water up to waist to reduce pressure from joints while allow for adequate exercise.    BLOOD GLUCOSES Monitoring your blood glucoses is important for continued management of your diabetes. Please check your blood glucoses 2-4 times a day: fasting, before meals and at bedtime (you can rotate these measurements - e.g. one day check before the 3 meals, the next day check before 2 of the meals and before bedtime, etc.).   HYPOGLYCEMIA (low blood sugar) Hypoglycemia is usually a reaction to not eating, exercising, or taking too much insulin/ other diabetes drugs.   Symptoms include tremors, sweating, hunger, confusion, headache, etc. Treat IMMEDIATELY with 15 grams of Carbs: . 4 glucose tablets .  cup regular juice/soda . 2 tablespoons raisins . 4 teaspoons sugar . 1 tablespoon honey Recheck blood glucose in 15 mins and repeat above if still symptomatic/blood glucose <100.  RECOMMENDATIONS TO REDUCE YOUR RISK OF DIABETIC COMPLICATIONS: * Take your prescribed MEDICATION(S) * Follow a DIABETIC diet: Complex carbs, fiber rich foods, (monounsaturated and polyunsaturated) fats * AVOID saturated/trans fats, high fat foods, >2,300 mg salt per day. * EXERCISE at least 5 times a week for 30 minutes or preferably daily.  * DO NOT SMOKE OR DRINK more than 1 drink a day. * Check your FEET every day. Do not wear tightfitting shoes. Contact us if you develop an ulcer * See your EYE doctor once a year or more if needed * Get a FLU shot once a year * Get a PNEUMONIA vaccine once before and once after age 23 years  GOALS:  * Your Hemoglobin A1c of <7%  * fasting sugars need to be <130 * after meals sugars need to be <180 (2h after you start eating) * Your Systolic BP should be 119 or lower  * Your Diastolic BP should be 80 or lower  * Your HDL (Good Cholesterol) should be 40 or higher  * Your LDL (Bad Cholesterol) should be 100 or lower. * Your Triglycerides should be 150 or lower  * Your Urine microalbumin (kidney function) should be <30 * Your Body Mass Index should be 25 or lower    Please  consider the following ways to cut down carbs and fat and increase fiber and micronutrients in your diet: - substitute whole grain for white bread or pasta - substitute brown rice for white rice - substitute 90-calorie flat bread pieces for slices of bread when possible - substitute sweet potatoes or yams for white potatoes - substitute humus for margarine - substitute tofu for cheese when possible - substitute almond or rice milk for regular milk (would not  drink soy milk daily due to concern for soy estrogen influence on breast cancer risk) - substitute dark chocolate for other sweets when possible - substitute water - can add lemon or orange slices for taste - for diet sodas (artificial sweeteners will trick your body that you can eat sweets without getting calories and will lead you to overeating and weight gain in the long run) - do not skip breakfast or other meals (this will slow down the metabolism and will result in more weight gain over time)  - can try smoothies made from fruit and almond/rice milk in am instead of regular breakfast - can also try old-fashioned (not instant) oatmeal made with almond/rice milk in am - order the dressing on the side when eating salad at a restaurant (pour less than half of the dressing on the salad) - eat as little meat as possible - can try juicing, but should not forget that juicing will get rid of the fiber, so would alternate with eating raw veg./fruits or drinking smoothies - use as little oil as possible, even when using olive oil - can dress a salad with a mix of balsamic vinegar and lemon juice, for e.g. - use agave nectar, stevia sugar, or regular sugar rather than artificial sweateners - steam or broil/roast veggies  - snack on veggies/fruit/nuts (unsalted, preferably) when possible, rather than processed foods - reduce or eliminate aspartame in diet (it is in diet sodas, chewing gum, etc) Read the labels!  Try to read Dr. Janene Harvey book: "Program for Reversing Diabetes" for other ideas for healthy eating.

## 2015-09-06 NOTE — Progress Notes (Signed)
Patient ID: [name], female   DOB: [birth year], 54 y.o.   MRN: [id number]  HPI: [name] [name] is a 54 y.o.-year-old female, referred by her PCP, Dr.Bedsole, for management of DM2, dx in 2012, insulin-dependent since 2015, uncontrolled, without complications and also hypothyroidism. She previously saw Dr Howell Rucks, who since left the practice. Last visit with her 3 mo ago.  DM2: Last hemoglobin A1c was: Lab Results  Component Value Date   HGBA1C 12.0* 04/26/2015   HGBA1C 12.8* 01/04/2015   HGBA1C 12.2* 07/27/2014   Pt is on a regimen of: - VGo 40 with 6 clicks per meal - her granddaughter rips it off. She lately did not have it attached because of this. Insurance not covering it completely.  - Invokana 300 mg daily in am  She tried (per review of Dr. Boyd Kerbs note): - Tried Metformin and states that "it wasn't working". Probably affected her IBS symptoms as well, but doesn't recall specifically.  - Then tried glipizide and that " didn't work either".  - Has been on insulin since 6 + months ~2015. - Started on lantus, but having problems with compliance as having to force herself to take the shot. Doesn't like injectable therapy. Trying to get her husband to give her the injections.  - Has been on Novolog before but doesn't like the shots.  - Switched from Lantus to [place] with Humalog Jan 2016  Pt is not checking her sugars. - am: n/c - 2h after b'fast: n/c - before lunch: n/c - 2h after lunch: n/c - before dinner: n/c - 2h after dinner: n/c - bedtime: n/c - nighttime: n/c No lows. Highest: 400s.  Glucometer: OneTouch Ultra 2, mini.  Pt's meals are: - Breakfast: toast - busy with the grandbaby - Lunch: 1/2 sandwich - Dinner: meat + veggies, baked potato.  - Snacks: chips, [place]tn Dew  - no CKD, last BUN/creatinine:  Lab Results  Component Value Date   BUN 10 04/26/2015   CREATININE 0.79 04/26/2015   - last set of lipids: Lab Results  Component Value Date   CHOL 213*  01/04/2015   HDL 44.20 01/04/2015   LDLCALC 129* 01/04/2015   LDLDIRECT 206.0 07/27/2014   TRIG 199.0* 01/04/2015   CHOLHDL 5 01/04/2015   - last eye exam was in 08/11/2014. No DR.  - no numbness and tingling in her feet.  Pt has FH of DM in [place], uncle, aunts.  Hypothyroidism: - dx 2008 - on LT4 200 >> 212.5 mcg daily - takes LT4: Fasting In am With water Eats >60 min later No Iron, Calcium, MVI, PPI  Denies missing doses.  Last TSH: Lab Results  Component Value Date   TSH 31.59* 04/26/2015   TSH 0.155* 01/27/2015   TSH 13.32* 01/04/2015   TSH 23.39* 10/13/2013   TSH 11.19* 10/06/2013   TSH 0.015* 09/18/2013   TSH 9.09* 06/23/2013   TSH 8.43* 05/13/2012   TSH 11.90* 11/30/2011   TSH 9.81* 11/27/2011   FREET4 0.26* 04/26/2015   FREET4 2.30* 01/27/2015   FREET4 0.57* 01/04/2015   FREET4 0.56* 07/27/2014   FREET4 0.56* 01/08/2014   FREET4 2.28* 10/29/2013   FREET4 0.65 08/26/2012   FREET4 0.63 05/20/2012   FREET4 0.63 11/30/2011   FREET4 0.6 01/04/2009   ROS: Constitutional: no weight gain/loss, no fatigue, no subjective hyperthermia/hypothermia Eyes: no blurry vision, no xerophthalmia ENT: no sore throat, no nodules palpated in throat, no dysphagia/odynophagia, no hoarseness Cardiovascular: no CP/SOB/palpitations/leg swelling Respiratory: no cough/SOB Gastrointestinal: no  N/V/D/C Musculoskeletal: no muscle/joint aches Skin: no rashes Neurological: no tremors/numbness/tingling/dizziness, + HA Psychiatric: no depression/anxiety  Past Medical History  Diagnosis Date  . Osteoporosis   . Intermittent vertigo   . B12 deficiency   . Peripheral neuropathy   . GERD (gastroesophageal reflux disease)   . Anxiety   . Hyperlipidemia   . Hypothyroidism   . IBS (irritable bowel syndrome)   . Hypertension   . Leg cramping     "@ night" (09/18/2013)  . Type II diabetes mellitus   . Umbilical hernia     unrepaired (09/18/2013)  . Migraine     "once q couple  months" (09/18/2013)  . 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 Cesarean section  1989  . Laparoscopic assisted vaginal hysterectomy  09/23/2012    Procedure: LAPAROSCOPIC ASSISTED VAGINAL HYSTERECTOMY;  Surgeon: Linda Hedges, DO;  Location: [facility];  Service: Gynecology;  Laterality: N/A;  pull Dr Gregor Hams instrument  . Tubal ligation Bilateral 1992  . Vaginal hysterectomy  2013   Social History   Social History  . Marital Status: Married    Spouse Name: N/A  . Number of Children: 2  . Years of Education: N/A   Occupational History  . UNEMPLOYED     homemaker   Social History Main Topics  . Smoking status: Former Smoker -- .2 years    Types: Cigarettes    Quit date: 12/10/1990  . Smokeless tobacco: Never Used  . Alcohol Use: No  . Drug Use: No  . Sexual Activity: Yes   Other Topics Concern  . Not on file   Social History Narrative   Regular exercise- no    Diet- lots of [place] dew, limits fast foods    Current Outpatient Prescriptions on File Prior to Visit  Medication Sig Dispense Refill  . atorvastatin (LIPITOR) 80 MG tablet TAKE 1 TABLET (80 MG TOTAL) BY MOUTH DAILY. 30 tablet 5  . canagliflozin (INVOKANA) 300 MG TABS tablet Take 300 mg by mouth daily before breakfast. 30 tablet 3  . Chlorpheniramine-DM 4-30 MG TABS 1 tab every 6 hours as needed for nasal congestion 30 each 0  . Cholecalciferol 400 UNITS CAPS Take 1 capsule (400 Units total) by mouth 2 (two) times daily. 60 each 11  . eletriptan (RELPAX) 40 MG tablet One tablet by mouth at onset of headache. May repeat in 2 hours if headache persists or recurs. 10 tablet 0  . levothyroxine (SYNTHROID, LEVOTHROID) 200 MCG tablet TAKE 1 TABLET BY MOUTH DAILY TAKE ALONG WITH THE 25 MG DOSE. 30 tablet 5  . levothyroxine (SYNTHROID, LEVOTHROID) 25 MCG tablet Take 1 tablet (25 mcg total) by mouth daily before breakfast. In addition to 200 mcg  daily for TDD 225 mcg 30 tablet 2  . ondansetron (ZOFRAN) 4 MG tablet TAKE 1 TABLET BY MOUTH EVERY 6 HOURS 30 tablet 0  . ondansetron (ZOFRAN-ODT) 8 MG disintegrating tablet Take 8 mg by mouth every 8 (eight) hours as needed for nausea or vomiting.    . sodium chloride (OCEAN) 0.65 % nasal spray 2 sprays per nostril  3 times daily prn congestion 30 mL 0   No current facility-administered medications on file prior to visit.   Allergies  Allergen Reactions  . Codeine Hives, Anxiety and Other (See Comments)    REACTION: N \\T \ V and hyper. Pt can take Percocet.   Family History  Problem Relation Age of  Onset  . Heart failure Mother   . Diabetes Maternal Grandmother   . Alzheimer's disease Maternal Grandmother   . Coronary artery disease Maternal Grandmother   . Heart attack Maternal Grandfather   . Diabetes Other   . Heart disease Other   . Heart failure Brother    PE: BP 114/72 mmHg  Pulse 98  Temp(Src) 97.7 F (36.5 C) (Oral)  Resp 12  Wt 153 lb 9.6 oz (69.673 kg)  SpO2 98%  LMP 08/11/2012 Body mass index is 31.01 kg/(m^2). Wt Readings from Last 3 Encounters:  09/06/15 153 lb 9.6 oz (69.673 kg)  06/08/15 154 lb 12 oz (70.194 kg)  04/26/15 150 lb (68.04 kg)   Constitutional: overweight, in NAD Eyes: PERRLA, EOMI, no exophthalmos ENT: moist mucous membranes, no thyromegaly, no cervical lymphadenopathy Cardiovascular: RRR, No MRG Respiratory: CTA B Gastrointestinal: abdomen soft, NT, ND, BS+ Musculoskeletal: no deformities, strength intact in all 4 Skin: moist, warm, no rashes Neurological: no tremor with outstretched hands, DTR normal in all 4  ASSESSMENT: 1. DM2, insulin-dependent, uncontrolled, without complications - Noncompliant with CBG checks and meds  2. Hypothyroidism -  uncontrolled   PLAN:  1. Patient with long-standing, uncontrolled diabetes, on VGo patch pump + Invokana. She has not been checking sugars in the last month. She also mentions that the VGo  worked initially, but now her 6 year old granddaughter is pulling it off. She would like to come off the vehicle. She also is wondering about possible side effect of Invokana from what she read online. - I believe that the vehicle was a good match for her since it states her from performing injections, which were a problem for her in the past. However, she wants to stop this, we'll need to go back to basal-bolus insulin. For now I advised her to continue Invokana since she does not experience any of the side effects, and we'll need to see if we can stop it at next visit if we have any sugars to go by. I will also try to add metformin back, as of now, at half maximal dose. She agrees with this. - I suggested to:  Patient Instructions  Please stop VGo. Please start Lantus 40 units at bedtime. Start NovoLog: - 12 units before a smaller meal - 16 units before a larger meal Continue Invokana 300 mg in am. Start Metformin 500 mg 2x a day with meals.   Please return in 1.5 months with your sugar log.   - Strongly advised her to start checking sugars at different times of the day - check 3 times a day, rotating checks - given sugar log and advised how to fill it and to bring it at next appt  - given foot care handout and explained the principles  - given instructions for hypoglycemia management "15-15 rule"  - advised for yearly eye exams >> needs new one - Return to clinic in 1.5 mo with sugar log    2. Hypothyroidism - This is uncontrolled and I suspect that this is because she misses thyroid hormone doses. She denies this at this time, but the pattern of her TSH levels confirm it: She starts keeping medicines and her TSH increases, at what point, her levothyroxine dose is increased. She then starts to take it consistently for a period of time in the next TSH is very suppressed, as she is taking a high dose of levothyroxine. I explained that she cannot miss doses of this medicine. I advised to  put  the medication bottle on her nightstand and take it first thing when she wakes up.  Patient Instructions   Please continue the same dose of Levothyroxine: 212 mcg daily.  Take the thyroid hormone every day, with water, >30 minutes before breakfast, separated by >4 hours from acid reflux medications, calcium, iron, multivitamins.  - We'll check her thyroid tests today - She will need a refill of her levothyroxine was labs are back  - time spent with the patient: 45 minutes, of which >50% was spent in obtaining information about her diabetes and hypothyroidism, reviewing her previous labs, evaluations, and treatments, counseling her about her conditions (please see the discussed topics above), and developing a plan to further investigate and treat them.   Office Visit on 09/06/2015  Component Date Value Ref Range Status  . TSH 09/06/2015 59.96* 0.35 - 4.50 uIU/mL Final  . Free T4 09/06/2015 0.36* 0.60 - 1.60 ng/dL Final  . Hgb A1c MFr Bld 09/06/2015 14.7* 4.6 - 6.5 % Final   Glycemic Control Guidelines for People with Diabetes:Non Diabetic:  <6%Goal of Therapy: <7%Additional Action Suggested:  >8%    Abysmal labs, due to noncompliance. Was strongly advised to take her medicines as we discussed.

## 2015-09-07 LAB — T4, FREE: Free T4: 0.36 ng/dL — ABNORMAL LOW (ref 0.60–1.60)

## 2015-09-07 LAB — TSH: TSH: 59.96 u[IU]/mL — AB (ref 0.35–4.50)

## 2015-09-07 LAB — HEMOGLOBIN A1C: Hgb A1c MFr Bld: 14.7 % — ABNORMAL HIGH (ref 4.6–6.5)

## 2015-12-06 ENCOUNTER — Ambulatory Visit: Payer: 59 | Admitting: Internal Medicine

## 2015-12-07 ENCOUNTER — Encounter: Payer: Self-pay | Admitting: Family Medicine

## 2015-12-07 ENCOUNTER — Ambulatory Visit (INDEPENDENT_AMBULATORY_CARE_PROVIDER_SITE_OTHER): Payer: 59 | Admitting: Family Medicine

## 2015-12-07 VITALS — BP 100/76 | HR 82 | Temp 97.6°F | Ht 59.0 in | Wt 149.0 lb

## 2015-12-07 DIAGNOSIS — M25522 Pain in left elbow: Secondary | ICD-10-CM | POA: Diagnosis not present

## 2015-12-07 DIAGNOSIS — M7701 Medial epicondylitis, right elbow: Secondary | ICD-10-CM

## 2015-12-07 DIAGNOSIS — M7712 Lateral epicondylitis, left elbow: Secondary | ICD-10-CM | POA: Diagnosis not present

## 2015-12-07 MED ORDER — DICLOFENAC SODIUM 1 % TD GEL: 2.0000 g | Freq: Four times a day (QID) | TRANSDERMAL | Status: DC

## 2015-12-07 MED ORDER — ETODOLAC 500 MG PO TABS: 500.0000 mg | ORAL_TABLET | Freq: Two times a day (BID) | ORAL | Status: DC

## 2015-12-07 NOTE — Progress Notes (Signed)
Pre visit review using our clinic review tool, if applicable. No additional management support is needed unless otherwise documented below in the visit note. 

## 2015-12-07 NOTE — Progress Notes (Signed)
Dr. Frederico Hamman T. Jordyn Doane, MD, [facility] [place], [address] Phone: [phone] Fax: [phone]  12/07/2015  Patient: [name] [name], MRN: [id number], DOB: 02/28/1961, 54 y.o.  Primary Physician:  Eliezer Lofts, MD   Chief Complaint  Patient presents with  . Joint Swelling    Left Elbow   Subjective:   [name] [name] is a 54 y.o. very pleasant female patient who presents with the following:  Left elbow - swelling and hurting her some. Now it is irritating and hurting a lot. Bent or straight - it will hurt. She is having pain at the lateral upper condyle, the medial epicondyle, and also an antecubital fossa. She has pain with flexion at the elbow. There is no swelling or bogginess around the olecranon.  The only instigating event that she can think of is lifting her 60 pound granddaughter multiple times.  Lab Results  Component Value Date   HGBA1C 14.7* 09/06/2015      Past Medical History, Surgical History, Social History, Family History, Problem List, Medications, and Allergies have been reviewed and updated if relevant.  Patient Active Problem List   Diagnosis Date Noted  . Type 2 diabetes mellitus with hyperglycemia ([place]) 09/06/2015  . Atrial fibrillation ([place]) 02/01/2015  . Breast pain, left 01/25/2015  . Bursitis of right shoulder 01/10/2015  . Abdominal pain, left lateral 06/04/2014  . 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 Colitis, indeterminate 09/18/2013  . Hepatic lesion 09/18/2013  . Diverticulosis 09/18/2013  . Leucocytosis 08/25/2013  . Vitamin D deficiency 05/20/2012  . TOBACCO ABUSE 02/19/2011  . GLAUCOMA 02/01/2011  . Essential hypertension 02/01/2011  . Osteoporosis 02/01/2011  . INTERMITTENT VERTIGO 12/15/2010  . HEMATOCHEZIA 08/15/2010  . B12 deficiency 04/04/2010  . PERIPHERAL NEUROPATHY 05/10/2009  . GERD 04/08/2009  . Type 2 diabetes mellitus,  uncontrolled, with renal complications ([place]) [id number]  . ANXIETY DISORDER, GENERALIZED 06/22/2008  . Hypothyroidism 04/29/2008  . Hyperlipidemia 04/29/2008  . Migraine without aura 04/29/2008  . ALLERGIC RHINITIS 04/29/2008  . Irritable bowel syndrome 04/29/2008    Past Medical History  Diagnosis Date  . Osteoporosis   . Intermittent vertigo   . B12 deficiency   . Peripheral neuropathy ([place])   . GERD (gastroesophageal reflux disease)   . Anxiety   . Hyperlipidemia   . Hypothyroidism   . IBS (irritable bowel syndrome)   . Hypertension   . Leg cramping     "@ night" (09/18/2013)  . Type II diabetes mellitus ([place])   . Umbilical hernia     unrepaired (09/18/2013)  . Migraine     "once q couple months" (09/18/2013)  . 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 Cesarean section  1989  . Laparoscopic assisted vaginal hysterectomy  09/23/2012    Procedure: LAPAROSCOPIC ASSISTED VAGINAL HYSTERECTOMY;  Surgeon: Linda Hedges, DO;  Location: [facility];  Service: Gynecology;  Laterality: N/A;  pull Dr Gregor Hams instrument  . Tubal ligation Bilateral 1992  . Vaginal hysterectomy  2013    Social History   Social History  . Marital Status: Married    Spouse Name: N/A  . Number of Children: 2  . Years of Education: N/A   Occupational History  . UNEMPLOYED     homemaker   Social History Main Topics  . Smoking status: Former Smoker -- .2 years  Types: Cigarettes    Quit date: 12/10/1990  . Smokeless tobacco: Never Used  . Alcohol Use: No  . Drug Use: No  . Sexual Activity: Yes   Other Topics Concern  . Not on file   Social History Narrative   Regular exercise- no    Diet- lots of mountain dew, limits fast foods     Family History  Problem Relation Age of Onset  . Heart failure Mother   . Diabetes Maternal Grandmother   . Alzheimer's disease Maternal Grandmother   . Coronary artery disease  Maternal Grandmother   . Heart attack Maternal Grandfather   . Diabetes Other   . Heart disease Other   . Heart failure Brother     Allergies  Allergen Reactions  . Codeine Hives, Anxiety and Other (See Comments)    REACTION: N \\T \ V and hyper. Pt can take Percocet.    Medication list reviewed and updated in full in [place].  GEN: No fevers, chills. Nontoxic. Primarily MSK c/o today. MSK: Detailed in the HPI GI: tolerating PO intake without difficulty Neuro: No numbness, parasthesias, or tingling associated. Otherwise the pertinent positives of the ROS are noted above.   Objective:   BP 100/76 mmHg  Pulse 82  Temp(Src) 97.6 F (36.4 C) (Oral)  Ht 4\' 11"  (1.499 m)  Wt 149 lb (67.586 kg)  BMI 30.08 kg/m2  LMP 08/11/2012   GEN: WDWN, NAD, Non-toxic, Alert & Oriented x 3 HEENT: Atraumatic, Normocephalic.  Ears and Nose: No external deformity. EXTR: No clubbing/cyanosis/edema NEURO: Normal gait.  PSYCH: Normally interactive. Conversant. Not depressed or anxious appearing.  Calm demeanor.   Elbow: L Ecchymosis or edema: neg ROM: full flexion, extension, pronation, supination Shoulder ROM: Full Flexion: 4++/5, painful Extension: 5/5, PAINFUL Supination: 5/5, PAINFUL Pronation: 5/5, painful Wrist ext: 5/5 Wrist flexion: 5/5 No gross bony abnormality Varus and Valgus stress: stable ECRB tenderness: YES, TTP Medial epicondyle: TTP Lateral epicondyle, resisted wrist extension from wrist full pronation and flexion: PAINFUL grip: 5/5  sensation intact Tinel's, Elbow: negative    Radiology: No results found.  Assessment and Plan:   Elbow pain, left  Lateral epicondylitis (tennis elbow), left  Golfer's elbow, right  Combination of climber's elbow, tennis elbow, and golfer's elbow - likely climber's elbow more predominant.   Start with NSAIDS, stretching, basic rehab  Follow-up: Return in about 5 weeks (around 01/11/2016).  New Prescriptions    DICLOFENAC SODIUM (VOLTAREN) 1 % GEL    Apply 2 g topically 4 (four) times daily.   ETODOLAC (LODINE) 500 MG TABLET    Take 1 tablet (500 mg total) by mouth 2 (two) times daily.   Modified Medications   No medications on file   No orders of the defined types were placed in this encounter.    Signed,  Maud Deed. Couper Juncaj, MD   Patient's Medications  New Prescriptions   DICLOFENAC SODIUM (VOLTAREN) 1 % GEL    Apply 2 g topically 4 (four) times daily.   ETODOLAC (LODINE) 500 MG TABLET    Take 1 tablet (500 mg total) by mouth 2 (two) times daily.  Previous Medications   ATORVASTATIN (LIPITOR) 80 MG TABLET    TAKE 1 TABLET (80 MG TOTAL) BY MOUTH DAILY.   CANAGLIFLOZIN (INVOKANA) 300 MG TABS TABLET    Take 300 mg by mouth daily before breakfast.   CHLORPHENIRAMINE-DM 4-30 MG TABS    1 tab every 6 hours as needed for nasal congestion   CHOLECALCIFEROL  400 UNITS CAPS    Take 1 capsule (400 Units total) by mouth 2 (two) times daily.   ELETRIPTAN (RELPAX) 40 MG TABLET    One tablet by mouth at onset of headache. May repeat in 2 hours if headache persists or recurs.   GLUCOSE BLOOD (ONE TOUCH ULTRA TEST) TEST STRIP    Use to check blood sugars four times a day.  Dx: 250.42   INSULIN GLARGINE (LANTUS SOLOSTAR) 100 UNIT/ML SOLOSTAR PEN    Inject 40 Units into the skin daily at 10 pm.   INSULIN LISPRO (HUMALOG KWIKPEN) 100 UNIT/ML KIWKPEN    Inject 0.12-0.16 mLs (12-16 Units total) into the skin 3 (three) times daily.   INSULIN PEN NEEDLE (CAREFINE PEN NEEDLES) 32G X 4 MM MISC    Use 4x a day   LEVOTHYROXINE (SYNTHROID, LEVOTHROID) 200 MCG TABLET    TAKE 1 TABLET BY MOUTH DAILY TAKE ALONG WITH THE 25 MG DOSE.   LEVOTHYROXINE (SYNTHROID, LEVOTHROID) 25 MCG TABLET    Take 1 tablet (25 mcg total) by mouth daily before breakfast. In addition to 200 mcg daily for TDD 225 mcg   METFORMIN (GLUCOPHAGE) 500 MG TABLET    Take 1 tablet (500 mg total) by mouth 2 (two) times daily with a meal.   ONDANSETRON  (ZOFRAN) 4 MG TABLET    TAKE 1 TABLET BY MOUTH EVERY 6 HOURS   ONDANSETRON (ZOFRAN-ODT) 8 MG DISINTEGRATING TABLET    Take 8 mg by mouth every 8 (eight) hours as needed for nausea or vomiting.   ONETOUCH DELICA LANCETS FINE MISC    Use 4x a day   SODIUM CHLORIDE (OCEAN) 0.65 % NASAL SPRAY    2 sprays per nostril  3 times daily prn congestion  Modified Medications   No medications on file  Discontinued Medications   No medications on file

## 2015-12-19 ENCOUNTER — Emergency Department (HOSPITAL_COMMUNITY)
Admission: EM | Admit: 2015-12-19 | Discharge: 2015-12-19 | Disposition: A | Discharge status: Home / Self Care | Payer: 59 | Attending: Emergency Medicine | Admitting: Emergency Medicine

## 2015-12-19 ENCOUNTER — Encounter (HOSPITAL_COMMUNITY): Payer: Self-pay

## 2015-12-19 DIAGNOSIS — F419 Anxiety disorder, unspecified: Secondary | ICD-10-CM | POA: Insufficient documentation

## 2015-12-19 DIAGNOSIS — Z87891 Personal history of nicotine dependence: Secondary | ICD-10-CM | POA: Diagnosis not present

## 2015-12-19 DIAGNOSIS — N182 Chronic kidney disease, stage 2 (mild): Secondary | ICD-10-CM | POA: Insufficient documentation

## 2015-12-19 DIAGNOSIS — K219 Gastro-esophageal reflux disease without esophagitis: Secondary | ICD-10-CM | POA: Insufficient documentation

## 2015-12-19 DIAGNOSIS — R739 Hyperglycemia, unspecified: Secondary | ICD-10-CM

## 2015-12-19 DIAGNOSIS — I129 Hypertensive chronic kidney disease with stage 1 through stage 4 chronic kidney disease, or unspecified chronic kidney disease: Secondary | ICD-10-CM | POA: Insufficient documentation

## 2015-12-19 DIAGNOSIS — Z79899 Other long term (current) drug therapy: Secondary | ICD-10-CM | POA: Insufficient documentation

## 2015-12-19 DIAGNOSIS — E039 Hypothyroidism, unspecified: Secondary | ICD-10-CM | POA: Insufficient documentation

## 2015-12-19 DIAGNOSIS — E1165 Type 2 diabetes mellitus with hyperglycemia: Secondary | ICD-10-CM | POA: Diagnosis not present

## 2015-12-19 DIAGNOSIS — R51 Headache: Secondary | ICD-10-CM | POA: Diagnosis present

## 2015-12-19 DIAGNOSIS — Z794 Long term (current) use of insulin: Secondary | ICD-10-CM | POA: Insufficient documentation

## 2015-12-19 DIAGNOSIS — E785 Hyperlipidemia, unspecified: Secondary | ICD-10-CM | POA: Diagnosis not present

## 2015-12-19 DIAGNOSIS — G43809 Other migraine, not intractable, without status migrainosus: Secondary | ICD-10-CM | POA: Diagnosis not present

## 2015-12-19 LAB — CBG MONITORING, ED: GLUCOSE-CAPILLARY: 363 mg/dL — AB (ref 65–99)

## 2015-12-19 MED ORDER — SODIUM CHLORIDE 0.9 % IV BOLUS (SEPSIS)
500.0000 mL | Freq: Once | INTRAVENOUS | Status: AC
Start: 2015-12-19 — End: 2015-12-19
  Administered 2015-12-19: 500 mL via INTRAVENOUS

## 2015-12-19 MED ORDER — PROCHLORPERAZINE EDISYLATE 5 MG/ML IJ SOLN
10.0000 mg | Freq: Once | INTRAMUSCULAR | Status: AC
  Administered 2015-12-19: 10 mg via INTRAVENOUS
  Filled 2015-12-19: qty 2

## 2015-12-19 MED ORDER — KETOROLAC TROMETHAMINE 30 MG/ML IJ SOLN
30.0000 mg | Freq: Once | INTRAMUSCULAR | Status: AC
  Administered 2015-12-19: 30 mg via INTRAVENOUS
  Filled 2015-12-19: qty 1

## 2015-12-19 MED ORDER — DIPHENHYDRAMINE HCL 50 MG/ML IJ SOLN
25.0000 mg | Freq: Once | INTRAMUSCULAR | Status: AC
  Administered 2015-12-19: 25 mg via INTRAVENOUS
  Filled 2015-12-19: qty 1

## 2015-12-19 NOTE — Discharge Instructions (Signed)
Migraine Headache A migraine headache is an intense, throbbing pain on one or both sides of your head. A migraine can last for 30 minutes to several hours. CAUSES  The exact cause of a migraine headache is not always known. However, a migraine may be caused when nerves in the brain become irritated and release chemicals that cause inflammation. This causes pain. Certain things may also trigger migraines, such as:  Alcohol.  Smoking.  Stress.  Menstruation.  Aged cheeses.  Foods or drinks that contain nitrates, glutamate, aspartame, or tyramine.  Lack of sleep.  Chocolate.  Caffeine.  Hunger.  Physical exertion.  Fatigue.  Medicines used to treat chest pain (nitroglycerine), birth control pills, estrogen, and some blood pressure medicines. SIGNS AND SYMPTOMS  Pain on one or both sides of your head.  Pulsating or throbbing pain.  Severe pain that prevents daily activities.  Pain that is aggravated by any physical activity.  Nausea, vomiting, or both.  Dizziness.  Pain with exposure to bright lights, loud noises, or activity.  General sensitivity to bright lights, loud noises, or smells. Before you get a migraine, you may get warning signs that a migraine is coming (aura). An aura may include:  Seeing flashing lights.  Seeing bright spots, halos, or zigzag lines.  Having tunnel vision or blurred vision.  Having feelings of numbness or tingling.  Having trouble talking.  Having muscle weakness. DIAGNOSIS  A migraine headache is often diagnosed based on:  Symptoms.  Physical exam.  A CT scan or MRI of your head. These imaging tests cannot diagnose migraines, but they can help rule out other causes of headaches. TREATMENT Medicines may be given for pain and nausea. Medicines can also be given to help prevent recurrent migraines.  HOME CARE INSTRUCTIONS  Only take over-the-counter or prescription medicines for pain or discomfort as directed by your  health care provider. The use of long-term narcotics is not recommended.  Lie down in a dark, quiet room when you have a migraine.  Keep a journal to find out what may trigger your migraine headaches. For example, write down:  What you eat and drink.  How much sleep you get.  Any change to your diet or medicines.  Limit alcohol consumption.  Quit smoking if you smoke.  Get 7-9 hours of sleep, or as recommended by your health care provider.  Limit stress.  Keep lights dim if bright lights bother you and make your migraines worse. SEEK IMMEDIATE MEDICAL CARE IF:   Your migraine becomes severe.  You have a fever.  You have a stiff neck.  You have vision loss.  You have muscular weakness or loss of muscle control.  You start losing your balance or have trouble walking.  You feel faint or pass out.  You have severe symptoms that are different from your first symptoms. MAKE SURE YOU:   Understand these instructions.  Will watch your condition.  Will get help right away if you are not doing well or get worse.   This information is not intended to replace advice given to you by your health care provider. Make sure you discuss any questions you have with your health care provider.   Document Released: 11/26/2005 Document Revised: 12/17/2014 Document Reviewed: 08/03/2013 Elsevier Interactive Patient Education 2016 [place].  Hyperglycemia Hyperglycemia occurs when the glucose (sugar) in your blood is too high. Hyperglycemia can happen for many reasons, but it most often happens to people who do not know they have diabetes or  are not managing their diabetes properly.  CAUSES  Whether you have diabetes or not, there are other causes of hyperglycemia. Hyperglycemia can occur when you have diabetes, but it can also occur in other situations that you might not be as aware of, such as: Diabetes  If you have diabetes and are having problems controlling your blood glucose,  hyperglycemia could occur because of some of the following reasons:  Not following your meal plan.  Not taking your diabetes medications or not taking it properly.  Exercising less or doing less activity than you normally do.  Being sick. Pre-diabetes  This cannot be ignored. Before people develop Type 2 diabetes, they almost always have "pre-diabetes." This is when your blood glucose levels are higher than normal, but not yet high enough to be diagnosed as diabetes. Research has shown that some long-term damage to the body, especially the heart and circulatory system, may already be occurring during pre-diabetes. If you take action to manage your blood glucose when you have pre-diabetes, you may delay or prevent Type 2 diabetes from developing. Stress  If you have diabetes, you may be "diet" controlled or on oral medications or insulin to control your diabetes. However, you may find that your blood glucose is higher than usual in the hospital whether you have diabetes or not. This is often referred to as "stress hyperglycemia." Stress can elevate your blood glucose. This happens because of hormones put out by the body during times of stress. If stress has been the cause of your high blood glucose, it can be followed regularly by your caregiver. That way he/she can make sure your hyperglycemia does not continue to get worse or progress to diabetes. Steroids  Steroids are medications that act on the infection fighting system (immune system) to block inflammation or infection. One side effect can be a rise in blood glucose. Most people can produce enough extra insulin to allow for this rise, but for those who cannot, steroids make blood glucose levels go even higher. It is not unusual for steroid treatments to "uncover" diabetes that is developing. It is not always possible to determine if the hyperglycemia will go away after the steroids are stopped. A special blood test called an A1c is sometimes  done to determine if your blood glucose was elevated before the steroids were started. SYMPTOMS  Thirsty.  Frequent urination.  Dry mouth.  Blurred vision.  Tired or fatigue.  Weakness.  Sleepy.  Tingling in feet or leg. DIAGNOSIS  Diagnosis is made by monitoring blood glucose in one or all of the following ways:  A1c test. This is a chemical found in your blood.  Fingerstick blood glucose monitoring.  Laboratory results. TREATMENT  First, knowing the cause of the hyperglycemia is important before the hyperglycemia can be treated. Treatment may include, but is not be limited to:  Education.  Change or adjustment in medications.  Change or adjustment in meal plan.  Treatment for an illness, infection, etc.  More frequent blood glucose monitoring.  Change in exercise plan.  Decreasing or stopping steroids.  Lifestyle changes. HOME CARE INSTRUCTIONS   Test your blood glucose as directed.  Exercise regularly. Your caregiver will give you instructions about exercise. Pre-diabetes or diabetes which comes on with stress is helped by exercising.  Eat wholesome, balanced meals. Eat often and at regular, fixed times. Your caregiver or nutritionist will give you a meal plan to guide your sugar intake.  Being at an ideal weight is important. If  needed, losing as little as 10 to 15 pounds may help improve blood glucose levels. SEEK MEDICAL CARE IF:   You have questions about medicine, activity, or diet.  You continue to have symptoms (problems such as increased thirst, urination, or weight gain). SEEK IMMEDIATE MEDICAL CARE IF:   You are vomiting or have diarrhea.  Your breath smells fruity.  You are breathing faster or slower.  You are very sleepy or incoherent.  You have numbness, tingling, or pain in your feet or hands.  You have chest pain.  Your symptoms get worse even though you have been following your caregiver's orders.  If you have any other  questions or concerns.   This information is not intended to replace advice given to you by your health care provider. Make sure you discuss any questions you have with your health care provider.   Document Released: 05/22/2001 Document Revised: 02/18/2012 Document Reviewed: 08/02/2015 Elsevier Interactive Patient Education [employer].

## 2015-12-19 NOTE — ED Notes (Signed)
Per pt she has hx of migraines; pt started having HA on Wednesday night; pt reports nausea begin tonight; Pt c/o of pain 12/10 on arrival; Pt a&o 4 on arrival;

## 2015-12-19 NOTE — ED Notes (Signed)
[facility] 363. Dr. Betsey Holiday notified.

## 2015-12-19 NOTE — ED Provider Notes (Addendum)
CSN: [id number]     Arrival date & time 12/19/15  0221 History   First MD Initiated Contact with Patient 12/19/15 [phone]     Chief Complaint  Patient presents with  . Headache     (Consider location/radiation/quality/duration/timing/severity/associated sxs/prior Treatment) HPI Comments: Patient presents for evaluation of headache. Patient has a severe, global headache that has been present for 3 days. Patient does have a history of migraines, and this feels similar to her migraines. She reports that the headache has progressively and slowly worsened over time. Tonight she has developed nausea and vomiting associated with headache. She has not had any fever. There is no neck stiffness.  Patient is a 55 y.o. female presenting with headaches.  Headache Associated symptoms: nausea and vomiting     Past Medical History  Diagnosis Date  . Osteoporosis   . Intermittent vertigo   . B12 deficiency   . Peripheral neuropathy ([place])   . GERD (gastroesophageal reflux disease)   . Anxiety   . Hyperlipidemia   . Hypothyroidism   . IBS (irritable bowel syndrome)   . Hypertension   . Leg cramping     "@ night" (09/18/2013)  . Type II diabetes mellitus ([place])   . Umbilical hernia     unrepaired (09/18/2013)  . Migraine     "once q couple months" (09/18/2013)  . 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 Cesarean section  1989  . Laparoscopic assisted vaginal hysterectomy  09/23/2012    Procedure: LAPAROSCOPIC ASSISTED VAGINAL HYSTERECTOMY;  Surgeon: Linda Hedges, DO;  Location: [facility];  Service: Gynecology;  Laterality: N/A;  pull Dr Gregor Hams instrument  . Tubal ligation Bilateral 1992  . Vaginal hysterectomy  2013   Family History  Problem Relation Age of Onset  . Heart failure Mother   . Diabetes Maternal Grandmother   . Alzheimer's disease Maternal Grandmother   . Coronary artery disease Maternal Grandmother   .  Heart attack Maternal Grandfather   . Diabetes Other   . Heart disease Other   . Heart failure Brother    Social History  Substance Use Topics  . Smoking status: Former Smoker -- .2 years    Types: Cigarettes    Quit date: 12/10/1990  . Smokeless tobacco: Never Used  . Alcohol Use: No   OB History    No data available     Review of Systems  Gastrointestinal: Positive for nausea and vomiting.  Neurological: Positive for headaches.  All other systems reviewed and are negative.     Allergies  Codeine  Home Medications   Prior to Admission medications   Medication Sig Start Date End Date Taking? Authorizing Provider  atorvastatin (LIPITOR) 80 MG tablet TAKE 1 TABLET (80 MG TOTAL) BY MOUTH DAILY. 04/29/15   Amy Cletis Athens, MD  canagliflozin (INVOKANA) 300 MG TABS tablet Take 300 mg by mouth daily before breakfast. 06/08/15   Haydee Monica, MD  Chlorpheniramine-DM 4-30 MG TABS 1 tab every 6 hours as needed for nasal congestion 02/01/15   Amy E Diona Browner, MD  Cholecalciferol 400 UNITS CAPS Take 1 capsule (400 Units total) by mouth 2 (two) times daily. 01/10/15   Amy Cletis [place], MD  diclofenac sodium (VOLTAREN) 1 % GEL Apply 2 g topically 4 (four) times daily. 12/07/15   Owens Loffler, MD  eletriptan (RELPAX) 40 MG tablet One tablet by mouth at onset of headache. May repeat in 2 hours  if headache persists or recurs. 02/01/15   Amy Cletis [place], MD  etodolac (LODINE) 500 MG tablet Take 1 tablet (500 mg total) by mouth 2 (two) times daily. 12/07/15   Spencer Copland, MD  glucose blood (ONE TOUCH ULTRA TEST) test strip Use to check blood sugars four times a day.  Dx: 250.42 09/06/15   Philemon Kingdom, MD  Insulin Glargine (LANTUS SOLOSTAR) 100 UNIT/ML Solostar Pen Inject 40 Units into the skin daily at 10 pm. 09/06/15   Philemon Kingdom, MD  insulin lispro (HUMALOG KWIKPEN) 100 UNIT/ML KiwkPen Inject 0.12-0.16 mLs (12-16 Units total) into the skin 3 (three) times daily. 09/06/15   Philemon Kingdom, MD  Insulin Pen Needle (CAREFINE PEN NEEDLES) 32G X 4 MM MISC Use 4x a day 09/06/15   Philemon Kingdom, MD  levothyroxine (SYNTHROID, LEVOTHROID) 200 MCG tablet TAKE 1 TABLET BY MOUTH DAILY TAKE ALONG WITH THE 25 MG DOSE. 05/27/15   Amy Cletis [place], MD  levothyroxine (SYNTHROID, LEVOTHROID) 25 MCG tablet Take 1 tablet (25 mcg total) by mouth daily before breakfast. In addition to 200 mcg daily for TDD 225 mcg 04/27/15   Haydee Monica, MD  metFORMIN (GLUCOPHAGE) 500 MG tablet Take 1 tablet (500 mg total) by mouth 2 (two) times daily with a meal. 09/06/15   Philemon Kingdom, MD  ondansetron (ZOFRAN) 4 MG tablet TAKE 1 TABLET BY MOUTH EVERY 6 HOURS 03/08/15   Amy Cletis [place], MD  ondansetron (ZOFRAN-ODT) 8 MG disintegrating tablet Take 8 mg by mouth every 8 (eight) hours as needed for nausea or vomiting.    Historical Provider, MD  [facility] DELICA LANCETS FINE MISC Use 4x a day 09/06/15   Philemon Kingdom, MD  sodium chloride (OCEAN) 0.65 % nasal spray 2 sprays per nostril  3 times daily prn congestion 02/01/15   Jinny Sanders, MD   LMP 08/11/2012 Physical Exam  Constitutional: She is oriented to person, place, and time. She appears well-developed and well-nourished. No distress.  HENT:  Head: Normocephalic and atraumatic.  Right Ear: Hearing normal.  Left Ear: Hearing normal.  Nose: Nose normal.  Mouth/Throat: Oropharynx is clear and moist and mucous membranes are normal.  Eyes: Conjunctivae and EOM are normal. Pupils are equal, round, and reactive to light.  Neck: Normal range of motion. Neck supple.  Cardiovascular: Regular rhythm, S1 normal and S2 normal.  Exam reveals no gallop and no friction rub.   No murmur heard. Pulmonary/Chest: Effort normal and breath sounds normal. No respiratory distress. She exhibits no tenderness.  Abdominal: Soft. Normal appearance and bowel sounds are normal. There is no hepatosplenomegaly. There is no tenderness. There is no rebound, no guarding, no  tenderness at McBurney's point and negative Murphy's sign. No hernia.  Musculoskeletal: Normal range of motion.  Neurological: She is alert and oriented to person, place, and time. She has normal strength. No cranial nerve deficit or sensory deficit. Coordination normal. GCS eye subscore is 4. GCS verbal subscore is 5. GCS motor subscore is 6.  Skin: Skin is warm, dry and intact. No rash noted. No cyanosis.  Psychiatric: She has a normal mood and affect. Her speech is normal and behavior is normal. Thought content normal.  Nursing note and vitals reviewed.   ED Course  Procedures (including critical care time) Labs Review Labs Reviewed - No data to display  Imaging Review No results found. I have personally reviewed and evaluated these images and lab results as part of my medical decision-making.   EKG Interpretation  None      MDM   Final diagnoses:  None   migraine headache Hyperglycemia  She presents to the emergency department for evaluation of headache. Patient has a history of recurrent migraine. She did feel that this headache is similar to previous migraines that she has had in the past. She has had significant improvement with migraine cocktail. Her neurologic examination was unremarkable at arrival, no concern for alternate etiology of her headache. She is appropriate for discharge. She did have mildly elevated blood sugar, has not taken her morning medications. She was instructed to take her insulin when she gets home.    Orpah Greek, MD 12/19/15 [id number]  Orpah Greek, MD 12/19/15 [phone]

## 2015-12-19 NOTE — ED Notes (Signed)
MD at bedside. 

## 2016-01-04 ENCOUNTER — Ambulatory Visit (INDEPENDENT_AMBULATORY_CARE_PROVIDER_SITE_OTHER): Payer: 59 | Admitting: Family Medicine

## 2016-01-04 ENCOUNTER — Encounter: Payer: Self-pay | Admitting: Family Medicine

## 2016-01-04 VITALS — BP 128/90 | HR 98 | Temp 98.3°F | Ht 59.0 in | Wt 150.2 lb

## 2016-01-04 DIAGNOSIS — R109 Unspecified abdominal pain: Secondary | ICD-10-CM

## 2016-01-04 DIAGNOSIS — M545 Low back pain, unspecified: Secondary | ICD-10-CM

## 2016-01-04 LAB — POC URINALSYSI DIPSTICK (AUTOMATED)
BILIRUBIN UA: NEGATIVE
Blood, UA: NEGATIVE
LEUKOCYTES UA: NEGATIVE
Nitrite, UA: NEGATIVE
PH UA: 6
Protein, UA: NEGATIVE
Spec Grav, UA: 1.015
Urobilinogen, UA: 0.2

## 2016-01-04 MED ORDER — MELOXICAM 15 MG PO TABS: 15.0000 mg | ORAL_TABLET | Freq: Every day | ORAL | Status: DC | PRN

## 2016-01-04 MED ORDER — CYCLOBENZAPRINE HCL 10 MG PO TABS: 5.0000 mg | ORAL_TABLET | Freq: Three times a day (TID) | ORAL | Status: DC | PRN

## 2016-01-04 NOTE — Progress Notes (Signed)
Pre visit review using our clinic review tool, if applicable. No additional management support is needed unless otherwise documented below in the visit note. 

## 2016-01-04 NOTE — Progress Notes (Signed)
Subjective:    Patient ID: [name] [name], female    DOB: 1961-01-04, 55 y.o.   MRN: [id number]  HPI Here with back pain   Started on Monday- woke up with it  Was playing with grand daughter the day before - she was leaning on her on the cough Mid back Hurts to take a deep breath, hurts to move or walk or drive Sharp if moving Dull if not  Has to take breaks  Is in the middle - upper lumbar area   Pain radiates down her back  Not to her legs  occ moves to either side   No freq/urgency  occ when she urinates she has to strain  No incontinence   Tried one aleve Took tylenol yesterday Has tried heating pad   No numbness or weakness   Results for orders placed or performed in visit on 01/04/16  POCT Urinalysis Dipstick (Automated)  Result Value Ref Range   Color, UA Yellow    Clarity, UA Clear    Glucose, UA 3+ (1000mg /dL)    Bilirubin, UA Neg.    Ketones, UA Trace    Spec Grav, UA 1.015    Blood, UA Neg.    pH, UA 6.0    Protein, UA Neg.    Urobilinogen, UA 0.2    Nitrite, UA Neg.    Leukocytes, UA Negative Negative     Patient Active Problem List   Diagnosis Date Noted  . Type 2 diabetes mellitus with hyperglycemia ([place]) 09/06/2015  . Atrial fibrillation ([place]) 02/01/2015  . Breast pain, left 01/25/2015  . Bursitis of right shoulder 01/10/2015  . Abdominal pain, left lateral 06/04/2014  . 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 Colitis, indeterminate 09/18/2013  . Hepatic lesion 09/18/2013  . Diverticulosis 09/18/2013  . Leucocytosis 08/25/2013  . Vitamin D deficiency 05/20/2012  . TOBACCO ABUSE 02/19/2011  . GLAUCOMA 02/01/2011  . Essential hypertension 02/01/2011  . Osteoporosis 02/01/2011  . INTERMITTENT VERTIGO 12/15/2010  . HEMATOCHEZIA 08/15/2010  . B12 deficiency 04/04/2010  . PERIPHERAL NEUROPATHY 05/10/2009  . GERD 04/08/2009  . Type 2 diabetes mellitus, uncontrolled, with renal complications ([place]) [id number]  .  ANXIETY DISORDER, GENERALIZED 06/22/2008  . Hypothyroidism 04/29/2008  . Hyperlipidemia 04/29/2008  . Migraine without aura 04/29/2008  . ALLERGIC RHINITIS 04/29/2008  . Irritable bowel syndrome 04/29/2008   Past Medical History  Diagnosis Date  . Osteoporosis   . Intermittent vertigo   . B12 deficiency   . Peripheral neuropathy ([place])   . GERD (gastroesophageal reflux disease)   . Anxiety   . Hyperlipidemia   . Hypothyroidism   . IBS (irritable bowel syndrome)   . Hypertension   . Leg cramping     "@ night" (09/18/2013)  . Type II diabetes mellitus ([place])   . Umbilical hernia     unrepaired (09/18/2013)  . Migraine     "once q couple months" (09/18/2013)  . 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 Cesarean section  1989  . Laparoscopic assisted vaginal hysterectomy  09/23/2012    Procedure: LAPAROSCOPIC ASSISTED VAGINAL HYSTERECTOMY;  Surgeon: Linda Hedges, DO;  Location: [facility];  Service: Gynecology;  Laterality: N/A;  pull Dr Gregor Hams instrument  . Tubal ligation Bilateral 1992  . Vaginal hysterectomy  2013   Social History  Substance Use Topics  . Smoking status: Former Smoker -- .2 years  Types: Cigarettes    Quit date: 12/10/1990  . Smokeless tobacco: Never Used  . Alcohol Use: No   Family History  Problem Relation Age of Onset  . Heart failure Mother   . Diabetes Maternal Grandmother   . Alzheimer's disease Maternal Grandmother   . Coronary artery disease Maternal Grandmother   . Heart attack Maternal Grandfather   . Diabetes Other   . Heart disease Other   . Heart failure Brother    Allergies  Allergen Reactions  . Codeine Hives, Anxiety and Other (See Comments)    REACTION: N \\T \ V and hyper. Pt can take Percocet.   Current Outpatient Prescriptions on File Prior to Visit  Medication Sig Dispense Refill  . atorvastatin (LIPITOR) 80 MG tablet TAKE 1 TABLET (80 MG TOTAL) BY  MOUTH DAILY. 30 tablet 5  . canagliflozin (INVOKANA) 300 MG TABS tablet Take 300 mg by mouth daily before breakfast. 30 tablet 3  . Chlorpheniramine-DM 4-30 MG TABS 1 tab every 6 hours as needed for nasal congestion 30 each 0  . diclofenac sodium (VOLTAREN) 1 % GEL Apply 2 g topically 4 (four) times daily. 3 Tube 5  . eletriptan (RELPAX) 40 MG tablet One tablet by mouth at onset of headache. May repeat in 2 hours if headache persists or recurs. 10 tablet 0  . glucose blood (ONE TOUCH ULTRA TEST) test strip Use to check blood sugars four times a day.  Dx: 250.42 200 each 11  . Insulin Glargine (LANTUS SOLOSTAR) 100 UNIT/ML Solostar Pen Inject 40 Units into the skin daily at 10 pm. 5 pen 2  . insulin lispro (HUMALOG KWIKPEN) 100 UNIT/ML KiwkPen Inject 0.12-0.16 mLs (12-16 Units total) into the skin 3 (three) times daily. 15 mL 2  . Insulin Pen Needle (CAREFINE PEN NEEDLES) 32G X 4 MM MISC Use 4x a day 200 each 11  . levothyroxine (SYNTHROID, LEVOTHROID) 200 MCG tablet TAKE 1 TABLET BY MOUTH DAILY TAKE ALONG WITH THE 25 MG DOSE. 30 tablet 5  . levothyroxine (SYNTHROID, LEVOTHROID) 25 MCG tablet Take 1 tablet (25 mcg total) by mouth daily before breakfast. In addition to 200 mcg daily for TDD 225 mcg 30 tablet 2  . ondansetron (ZOFRAN-ODT) 8 MG disintegrating tablet Take 8 mg by mouth every 8 (eight) hours as needed for nausea or vomiting.    Glory Rosebush DELICA LANCETS FINE MISC Use 4x a day 200 each 11  . sodium chloride (OCEAN) 0.65 % nasal spray 2 sprays per nostril  3 times daily prn congestion 30 mL 0   No current facility-administered medications on file prior to visit.    Review of Systems Review of Systems  Constitutional: Negative for fever, appetite change, fatigue and unexpected weight change.  Eyes: Negative for pain and visual disturbance.  Respiratory: Negative for cough and shortness of breath.   Cardiovascular: Negative for cp or palpitations    Gastrointestinal: Negative for  nausea, diarrhea and constipation.  Genitourinary: Negative for urgency and frequency. pos for high blood sugars  MSK pos for low back pain  Skin: Negative for pallor or rash   Neurological: Negative for weakness, light-headedness, numbness and headaches.  Hematological: Negative for adenopathy. Does not bruise/bleed easily.  Psychiatric/Behavioral: Negative for dysphoric mood. The patient is not nervous/anxious.         Objective:   Physical Exam  Constitutional: She appears well-developed and well-nourished. No distress.  obese and well appearing   HENT:  Head: Normocephalic and atraumatic.  Eyes: Conjunctivae  and EOM are normal. Pupils are equal, round, and reactive to light. No scleral icterus.  Neck: Normal range of motion. Neck supple.  Cardiovascular: Normal rate and regular rhythm.   Pulmonary/Chest: Effort normal and breath sounds normal. She has no wheezes. She has no rales.  Abdominal: Soft. Bowel sounds are normal. She exhibits no distension. There is no tenderness.  Musculoskeletal: She exhibits tenderness.       Lumbar back: She exhibits decreased range of motion, tenderness and spasm. She exhibits no bony tenderness and no edema.  Lumbar muscular tenderness worse on L including piriformis area No rash or skin change Flex 20 deg/ ext 10 deg  Lateral bend causes pain  No crepitus   No neuro changes   Lymphadenopathy:    She has no cervical adenopathy.  Neurological: She is alert. She has normal strength and normal reflexes. She displays no atrophy. No cranial nerve deficit or sensory deficit. She exhibits normal muscle tone. Coordination normal.  Negative SLR  Skin: Skin is warm and dry. No rash noted. No erythema. No pallor.  Psychiatric: She has a normal mood and affect.          Assessment & Plan:   Problem List Items Addressed This Visit      Other   Lumbar pain    Exam consistent with lumbosacral strain Trial of meloxicam 15 mg with food qd prn -  instead of otc meds Flexeril with caution of sedation Heat/ice or muscle rub Slow walking  Disc symptomatic care - see instructions on AVS  Update if not starting to improve in a week or if worsening    ua neg today       Relevant Medications   meloxicam (MOBIC) 15 MG tablet   cyclobenzaprine (FLEXERIL) 10 MG tablet    Other Visit Diagnoses    Flank pain    -  Primary    Relevant Orders    POCT Urinalysis Dipstick (Automated) (Completed)

## 2016-01-04 NOTE — Patient Instructions (Signed)
You can try a muscle rub or patch over the counter  Perhaps a cool compress may also help  When in bed - try lying on side with a pillow between bent knees or on back with pillow under bent knees Take meloxicam once daily with a meal for pain and inflammation Take flexeril with caution of sedation (muscle relaxer) up to three times per day  Walking - slow - tends to be the best for back pain  Update if not starting to improve in a week or if worsening

## 2016-01-05 NOTE — Assessment & Plan Note (Signed)
Exam consistent with lumbosacral strain Trial of meloxicam 15 mg with food qd prn - instead of otc meds Flexeril with caution of sedation Heat/ice or muscle rub Slow walking  Disc symptomatic care - see instructions on AVS  Update if not starting to improve in a week or if worsening    ua neg today

## 2016-01-11 ENCOUNTER — Ambulatory Visit: Payer: 59 | Admitting: Family Medicine

## 2016-02-21 LAB — HM DIABETES EYE EXAM

## 2016-03-08 ENCOUNTER — Encounter: Payer: Self-pay | Admitting: Internal Medicine

## 2016-04-24 ENCOUNTER — Ambulatory Visit (INDEPENDENT_AMBULATORY_CARE_PROVIDER_SITE_OTHER): Payer: 59 | Admitting: Internal Medicine

## 2016-04-24 ENCOUNTER — Encounter: Payer: Self-pay | Admitting: Internal Medicine

## 2016-04-24 VITALS — BP 116/80 | HR 100 | Temp 98.2°F | Wt 142.5 lb

## 2016-04-24 DIAGNOSIS — J301 Allergic rhinitis due to pollen: Secondary | ICD-10-CM

## 2016-04-24 MED ORDER — FLUTICASONE PROPIONATE 50 MCG/ACT NA SUSP: 2.0000 | Freq: Every day | NASAL | Status: DC

## 2016-04-24 MED ORDER — CETIRIZINE HCL 10 MG PO TABS: 10.0000 mg | ORAL_TABLET | Freq: Every day | ORAL | Status: DC

## 2016-04-24 NOTE — Progress Notes (Signed)
Pre visit review using our clinic review tool, if applicable. No additional management support is needed unless otherwise documented below in the visit note. 

## 2016-04-24 NOTE — Progress Notes (Signed)
HPI  Pt presents to the clinic today with c/o headache, facial pain and pressure, nasal congestion, sore throat and cough. This started 3 days ago. She is blowing blood tinged clear mucous out of her nose. She denies difficulty swallowing. The cough is productive of yellow mucous. She denies fever, chills or body aches. She did take 1 allergy pill and Robitussin without any relief. She has a history of seasonal allergies but does not take anything for them. She has not had sick contacts.  Review of Systems    Past Medical History  Diagnosis Date  . Osteoporosis   . Intermittent vertigo   . B12 deficiency   . Peripheral neuropathy ([place])   . GERD (gastroesophageal reflux disease)   . Anxiety   . Hyperlipidemia   . Hypothyroidism   . IBS (irritable bowel syndrome)   . Hypertension   . Leg cramping     "@ night" (09/18/2013)  . Type II diabetes mellitus ([place])   . Umbilical hernia     unrepaired (09/18/2013)  . Migraine     "once q couple months" (09/18/2013)  . 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 Heart failure Mother   . Diabetes Maternal Grandmother   . Alzheimer's disease Maternal Grandmother   . Coronary artery disease Maternal Grandmother   . Heart attack Maternal Grandfather   . Diabetes Other   . Heart disease Other   . Heart failure Brother     Social History   Social History  . Marital Status: Married    Spouse Name: N/A  . Number of Children: 2  . Years of Education: N/A   Occupational History  . UNEMPLOYED     homemaker   Social History Main Topics  . Smoking status: Former Smoker -- .2 years    Types: Cigarettes    Quit date: 12/10/1990  . Smokeless tobacco: Never Used  . Alcohol Use: No  . Drug Use: No  . Sexual Activity: Yes   Other Topics Concern  . Not on file   Social History Narrative   Regular exercise- no    Diet- lots of mountain dew, limits fast foods      Allergies  Allergen Reactions  . Codeine Hives, Anxiety and Other (See Comments)    REACTION: N \\T \ V and hyper. Pt can take Percocet.     Constitutional: Positive headache. Denies fatigue, fever or abrupt weight changes.  HEENT:  Positive facial pain, nasal congestion and sore throat. Denies eye redness, ear pain, ringing in the ears, wax buildup, runny nose or bloody nose. Respiratory: Positive cough. Denies difficulty breathing or shortness of breath.  Cardiovascular: Denies chest pain, chest tightness, palpitations or swelling in the hands or feet.   No other specific complaints in a complete review of systems (except as listed in HPI above).  Objective:  BP 116/80 mmHg  Pulse 100  Temp(Src) 98.2 F (36.8 C) (Oral)  Wt 142 lb 8 oz (64.638 kg)  SpO2 97%  LMP 08/11/2012   General: Appears her stated age,  in NAD. HEENT: Head: normal shape and size, maxillary sinus tenderness noted; Eyes: sclera white, no icterus, conjunctiva pink; Ears: Tm's pink but intact, normal light reflex, + serous effusion on the left; Nose: mucosa boggy and moist, septum midline; Throat/Mouth: + PND. Teeth present, mucosa erythematous and moist, no exudate noted, no lesions or ulcerations noted.  Neck:  No adenopathy  noted.  Cardiovascular: Normal rate and rhythm. S1,S2 noted.  No murmur, rubs or gallops noted.  Pulmonary/Chest: Normal effort and positive vesicular breath sounds. No respiratory distress. No wheezes, rales or ronchi noted.      Assessment & Plan:   Allergic Rhinitis  Can use a Neti Pot which can be purchased from your local drug store. Flonase 2 sprays each nostril for 3 days and then as needed. Zytrec 10 mg nightly 10 days Continue Robitussin as needed  RTC as needed or if symptoms persist.

## 2016-04-24 NOTE — Patient Instructions (Signed)
Allergic Rhinitis Allergic rhinitis is when the mucous membranes in the nose respond to allergens. Allergens are particles in the air that cause your body to have an allergic reaction. This causes you to release allergic antibodies. Through a chain of events, these eventually cause you to release histamine into the blood stream. Although meant to protect the body, it is this release of histamine that causes your discomfort, such as frequent sneezing, congestion, and an itchy, runny nose.  CAUSES Seasonal allergic rhinitis (hay fever) is caused by pollen allergens that may come from grasses, trees, and weeds. Year-round allergic rhinitis (perennial allergic rhinitis) is caused by allergens such as house dust mites, pet dander, and mold spores. SYMPTOMS  Nasal stuffiness (congestion).  Itchy, runny nose with sneezing and tearing of the eyes. DIAGNOSIS Your health care provider can help you determine the allergen or allergens that trigger your symptoms. If you and your health care provider are unable to determine the allergen, skin or blood testing may be used. Your health care provider will diagnose your condition after taking your health history and performing a physical exam. Your health care provider may assess you for other related conditions, such as asthma, pink eye, or an ear infection. TREATMENT Allergic rhinitis does not have a cure, but it can be controlled by:  Medicines that block allergy symptoms. These may include allergy shots, nasal sprays, and oral antihistamines.  Avoiding the allergen. Hay fever may often be treated with antihistamines in pill or nasal spray forms. Antihistamines block the effects of histamine. There are over-the-counter medicines that may help with nasal congestion and swelling around the eyes. Check with your health care provider before taking or giving this medicine. If avoiding the allergen or the medicine prescribed do not work, there are many new medicines  your health care provider can prescribe. Stronger medicine may be used if initial measures are ineffective. Desensitizing injections can be used if medicine and avoidance does not work. Desensitization is when a patient is given ongoing shots until the body becomes less sensitive to the allergen. Make sure you follow up with your health care provider if problems continue. HOME CARE INSTRUCTIONS It is not possible to completely avoid allergens, but you can reduce your symptoms by taking steps to limit your exposure to them. It helps to know exactly what you are allergic to so that you can avoid your specific triggers. SEEK MEDICAL CARE IF:  You have a fever.  You develop a cough that does not stop easily (persistent).  You have shortness of breath.  You start wheezing.  Symptoms interfere with normal daily activities.   This information is not intended to replace advice given to you by your health care provider. Make sure you discuss any questions you have with your health care provider.   Document Released: 08/21/2001 Document Revised: 12/17/2014 Document Reviewed: 08/03/2013 Elsevier Interactive Patient Education 2016 [employer]  

## 2016-07-17 ENCOUNTER — Encounter: Payer: Self-pay | Admitting: Internal Medicine

## 2016-07-17 ENCOUNTER — Ambulatory Visit (INDEPENDENT_AMBULATORY_CARE_PROVIDER_SITE_OTHER): Payer: 59 | Admitting: Internal Medicine

## 2016-07-17 ENCOUNTER — Other Ambulatory Visit: Payer: Self-pay | Admitting: Internal Medicine

## 2016-07-17 VITALS — BP 120/80 | HR 97 | Ht 61.0 in | Wt 146.0 lb

## 2016-07-17 DIAGNOSIS — E1149 Type 2 diabetes mellitus with other diabetic neurological complication: Secondary | ICD-10-CM | POA: Insufficient documentation

## 2016-07-17 DIAGNOSIS — E1142 Type 2 diabetes mellitus with diabetic polyneuropathy: Secondary | ICD-10-CM | POA: Insufficient documentation

## 2016-07-17 DIAGNOSIS — E1165 Type 2 diabetes mellitus with hyperglycemia: Secondary | ICD-10-CM

## 2016-07-17 DIAGNOSIS — E039 Hypothyroidism, unspecified: Secondary | ICD-10-CM

## 2016-07-17 DIAGNOSIS — Z794 Long term (current) use of insulin: Principal | ICD-10-CM

## 2016-07-17 LAB — POCT GLYCOSYLATED HEMOGLOBIN (HGB A1C): Hemoglobin A1C: 14.5

## 2016-07-17 MED ORDER — INSULIN LISPRO 100 UNIT/ML (KWIKPEN): 12.0000 [IU] | PEN_INJECTOR | Freq: Three times a day (TID) | SUBCUTANEOUS | 2 refills | Status: DC

## 2016-07-17 MED ORDER — INSULIN PEN NEEDLE 32G X 4 MM MISC: 11 refills | Status: DC

## 2016-07-17 MED ORDER — LEVOTHYROXINE SODIUM 25 MCG PO TABS: 12.5000 ug | ORAL_TABLET | Freq: Every day | ORAL | 2 refills | Status: DC

## 2016-07-17 MED ORDER — METFORMIN HCL 500 MG PO TABS: 500.0000 mg | ORAL_TABLET | Freq: Two times a day (BID) | ORAL | 1 refills | Status: DC

## 2016-07-17 MED ORDER — ONETOUCH LANCETS MISC: 3 refills | Status: DC

## 2016-07-17 MED ORDER — GLUCOSE BLOOD VI STRP: ORAL_STRIP | 3 refills | Status: DC

## 2016-07-17 MED ORDER — INSULIN GLARGINE 100 UNIT/ML SOLOSTAR PEN: 40.0000 [IU] | PEN_INJECTOR | Freq: Every day | SUBCUTANEOUS | 2 refills | Status: DC

## 2016-07-17 MED ORDER — CANAGLIFLOZIN 300 MG PO TABS: 300.0000 mg | ORAL_TABLET | Freq: Every day | ORAL | 3 refills | Status: DC

## 2016-07-17 MED ORDER — LEVOTHYROXINE SODIUM 200 MCG PO TABS: ORAL_TABLET | ORAL | 1 refills | Status: DC

## 2016-07-17 NOTE — Patient Instructions (Addendum)
  Please start: - Lantus 40 units at bedtime. - NovoLog: - 12 units before a smaller meal - 16 units before a larger meal - Invokana 300 mg in am. - Metformin 500 mg 2x a day with meals.   Please continue the same dose of Levothyroxine: 212.5 mcg daily.  Take the thyroid hormone every day, with water, >30 minutes before breakfast, separated by >4 hours from acid reflux medications, calcium, iron, multivitamins.  Please return in 1.5 months with your sugar log.

## 2016-07-17 NOTE — Addendum Note (Signed)
Addended by: Caprice Beaver T on: 07/17/2016 02:46 PM   Modules accepted: Orders

## 2016-07-17 NOTE — Progress Notes (Signed)
Patient ID: [name] [name], female   DOB: 1961-04-20, 55 y.o.   MRN: [id number]  HPI: [name] [name] is a 55 y.o.-year-old female, initially referred by her PCP, Dr.Bedsole, for management of DM2, dx in 2012, insulin-dependent since 2015, uncontrolled, without complications and also hypothyroidism. She previously saw Dr Howell Rucks, who since left the practice.   Last visit with me 10 mo ago(did not return in 1.5 mo as advised).  Since last visit >> had stress in her family >> stopped all meds for 4 months. She scheduled this appt as she started to feel poorly: very fatigues, exhausted, nausea, lack of appetite.  DM2: Last hemoglobin A1c was: Lab Results  Component Value Date   HGBA1C 14.7 (H) 09/06/2015   HGBA1C 12.0 (H) 04/26/2015   HGBA1C 12.8 (H) 01/04/2015   Pt was on a regimen of: - VGo 40 with 6 clicks per meal - her granddaughter rips it off. She lately did not have it attached because of this. Insurance not covering it completely.  - Invokana 300 mg daily in am  At last visit, we switched to: - Lantus 40 units at bedtime. - NovoLog 3x a day: - 12 units before a smaller meal - 16 units before a larger meal - Invokana 300 mg in am. - Metformin 500 mg 2x a day with meals.   She tried (per review of Dr. Boyd Kerbs note): - Tried Metformin and states that "it wasn't working". Probably affected her IBS symptoms as well, but doesn't recall specifically.  - Then tried glipizide and that " didn't work either".  - Has been on insulin since 6 + months ~2015. - Started on lantus, but having problems with compliance as having to force herself to take the shot. Doesn't like injectable therapy. Trying to get her husband to give her the injections.  - Has been on Novolog before but doesn't like the shots.  - Switched from Lantus to [place] with Humalog Jan 2016  Pt is not checking her sugars. - am: n/c - 2h after b'fast: n/c - before lunch: n/c - 2h after lunch: n/c - before dinner: n/c - 2h  after dinner: n/c - bedtime: n/c - nighttime: n/c  Glucometer: OneTouch Ultra 2, mini >> lost it  Pt's meals are: - Breakfast: toast - busy with the grandbaby - Lunch: 1/2 sandwich - Dinner: meat + veggies, baked potato.  - Snacks: chips, Mtn Dew  - no CKD, last BUN/creatinine:  Lab Results  Component Value Date   BUN 10 04/26/2015   CREATININE 0.79 04/26/2015   - last set of lipids: Lab Results  Component Value Date   CHOL 213 (H) 01/04/2015   HDL 44.20 01/04/2015   LDLCALC 129 (H) 01/04/2015   LDLDIRECT 206.0 07/27/2014   TRIG 199.0 (H) 01/04/2015   CHOLHDL 5 01/04/2015   - last eye exam was: Abstract on 03/08/2016  Component Date Value Ref Range Status  . HM Diabetic Eye Exam 02/21/2016 No Retinopathy  No Retinopathy Final   - no numbness and tingling in her feet.  Hypothyroidism: - dx 2008 - on LT4 200 >> 212.5 mcg daily - takes LT4: Fasting In am With water Eats >60 min later No Iron, Calcium, MVI, PPI  Her TSH levels are very fluctuating due to poor compliance with her levothyroxine:  Lab Results  Component Value Date   TSH 59.96 (H) 09/06/2015   TSH 31.59 (H) 04/26/2015   TSH 0.155 (L) 01/27/2015   TSH 13.32 (H) 01/04/2015  TSH 23.39 (H) 10/13/2013   TSH 11.19 (H) 10/06/2013   TSH 0.015 (L) 09/18/2013   TSH 9.09 (H) 06/23/2013   TSH 8.43 (H) 05/13/2012   TSH 11.90 (H) 11/30/2011   FREET4 0.36 (L) 09/06/2015   FREET4 0.26 (L) 04/26/2015   FREET4 2.30 (H) 01/27/2015   FREET4 0.57 (L) 01/04/2015   FREET4 0.56 (L) 07/27/2014   FREET4 0.56 (L) 01/08/2014   FREET4 2.28 (H) 10/29/2013   FREET4 0.65 08/26/2012   FREET4 0.63 05/20/2012   FREET4 0.63 11/30/2011   ROS: Constitutional: no weight gain/loss, + fatigue, no subjective hyperthermia/hypothermia Eyes: no blurry vision, no xerophthalmia ENT: no sore throat, no nodules palpated in throat, no dysphagia/odynophagia, no hoarseness Cardiovascular: no CP/SOB/palpitations/leg  swelling Respiratory: no cough/SOB Gastrointestinal: + N/no V/D/C/+ heartburn Musculoskeletal: no muscle/joint aches Skin: no rashes Neurological: no tremors/numbness/tingling/dizziness, + HA  I reviewed pt's medications, allergies, PMH, social hx, family hx, and changes were documented in the history of present illness. Otherwise, unchanged from my initial visit note.  Past Medical History:  Diagnosis Date  . Anxiety   . B12 deficiency   . GERD (gastroesophageal reflux disease)   . Hyperlipidemia   . Hypertension   . Hypothyroidism   . IBS (irritable bowel syndrome)   . Intermittent vertigo   . 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 Leg cramping    "@ night" (09/18/2013)  . Migraine    "once q couple months" (09/18/2013)  . Osteoporosis   . Peripheral neuropathy ([place])   . Type II diabetes mellitus ([place])   . Umbilical hernia    unrepaired (09/18/2013)   Past Surgical History:  Procedure Laterality Date  . CESAREAN SECTION  1989  . LAPAROSCOPIC ASSISTED VAGINAL HYSTERECTOMY  09/23/2012   Procedure: LAPAROSCOPIC ASSISTED VAGINAL HYSTERECTOMY;  Surgeon: Linda Hedges, DO;  Location: [facility];  Service: Gynecology;  Laterality: N/A;  pull Dr Gregor Hams instrument  . TUBAL LIGATION Bilateral 1992  . VAGINAL HYSTERECTOMY  2013   Social History   Social History  . Marital status: Married    Spouse name: N/A  . Number of children: 2  . Years of education: N/A   Occupational History  . UNEMPLOYED Unemployed    homemaker   Social History Main Topics  . Smoking status: Former Smoker    Years: 0.20    Types: Cigarettes    Quit date: 12/10/1990  . Smokeless tobacco: Never Used  . Alcohol use No  . Drug use: No  . Sexual activity: Yes   Other Topics Concern  . Not on file   Social History Narrative   Regular exercise- no    Diet- lots of mountain dew, limits fast foods    Current Outpatient Prescriptions on File Prior to Visit  Medication Sig  Dispense Refill  . atorvastatin (LIPITOR) 80 MG tablet TAKE 1 TABLET (80 MG TOTAL) BY MOUTH DAILY. 30 tablet 5  . canagliflozin (INVOKANA) 300 MG TABS tablet Take 300 mg by mouth daily before breakfast. 30 tablet 3  . cetirizine (ZYRTEC) 10 MG tablet Take 1 tablet (10 mg total) by mouth daily. 30 tablet 11  . Chlorpheniramine-DM 4-30 MG TABS 1 tab every 6 hours as needed for nasal congestion 30 each 0  . cyclobenzaprine (FLEXERIL) 10 MG tablet Take 0.5-1 tablets (5-10 mg total) by mouth 3 (three) times daily as needed for muscle spasms (back pain, caution of sedation). 30 tablet 0  . diclofenac sodium (VOLTAREN) 1 % GEL Apply  2 g topically 4 (four) times daily. 3 Tube 5  . eletriptan (RELPAX) 40 MG tablet One tablet by mouth at onset of headache. May repeat in 2 hours if headache persists or recurs. 10 tablet 0  . fluticasone (FLONASE) 50 MCG/ACT nasal spray Place 2 sprays into both nostrils daily. 16 g 6  . glucose blood (ONE TOUCH ULTRA TEST) test strip Use to check blood sugars four times a day.  Dx: 250.42 200 each 11  . Insulin Glargine (LANTUS SOLOSTAR) 100 UNIT/ML Solostar Pen Inject 40 Units into the skin daily at 10 pm. 5 pen 2  . insulin lispro (HUMALOG KWIKPEN) 100 UNIT/ML KiwkPen Inject 0.12-0.16 mLs (12-16 Units total) into the skin 3 (three) times daily. 15 mL 2  . Insulin Pen Needle (CAREFINE PEN NEEDLES) 32G X 4 MM MISC Use 4x a day 200 each 11  . levothyroxine (SYNTHROID, LEVOTHROID) 200 MCG tablet TAKE 1 TABLET BY MOUTH DAILY TAKE ALONG WITH THE 25 MG DOSE. 30 tablet 5  . levothyroxine (SYNTHROID, LEVOTHROID) 25 MCG tablet Take 1 tablet (25 mcg total) by mouth daily before breakfast. In addition to 200 mcg daily for TDD 225 mcg 30 tablet 2  . meloxicam (MOBIC) 15 MG tablet Take 1 tablet (15 mg total) by mouth daily as needed for pain (back pain). Take with a meal 30 tablet 1  . ondansetron (ZOFRAN-ODT) 8 MG disintegrating tablet Take 8 mg by mouth every 8 (eight) hours as needed for  nausea or vomiting.    Glory Rosebush DELICA LANCETS FINE MISC Use 4x a day 200 each 11  . sodium chloride (OCEAN) 0.65 % nasal spray 2 sprays per nostril  3 times daily prn congestion 30 mL 0   No current facility-administered medications on file prior to visit.    Allergies  Allergen Reactions  . Codeine Hives, Anxiety and Other (See Comments)    REACTION: N \\T \ V and hyper. Pt can take Percocet.   Family History  Problem Relation Age of Onset  . Heart failure Mother   . Diabetes Maternal Grandmother   . Alzheimer's disease Maternal Grandmother   . Coronary artery disease Maternal Grandmother   . Heart attack Maternal Grandfather   . Diabetes Other   . Heart disease Other   . Heart failure Brother    PE: Ht 5\' 1"  (1.549 m)   Wt 146 lb (66.2 kg)   LMP 08/11/2012   BMI 27.59 kg/m  Body mass index is 27.59 kg/m. Wt Readings from Last 3 Encounters:  07/17/16 146 lb (66.2 kg)  04/24/16 142 lb 8 oz (64.6 kg)  01/04/16 150 lb 4 oz (68.2 kg)   Constitutional: overweight, in NAD Eyes: PERRLA, EOMI, no exophthalmos ENT: moist mucous membranes, no thyromegaly, no cervical lymphadenopathy Cardiovascular: RRR, No MRG Respiratory: CTA B Gastrointestinal: abdomen soft, NT, ND, BS+ Musculoskeletal: no deformities, strength intact in all 4 Skin: moist, warm, no rashes Neurological: no tremor with outstretched hands, DTR normal in all 4  ASSESSMENT: 1. DM2, insulin-dependent, uncontrolled, without Long term complications, but with hyperglycemia - Noncompliant with CBG checks and meds  2. Hypothyroidism -  uncontrolled   PLAN:  1. Patient with long-standing, uncontrolled diabetes, returning after a long absence. At last visit, we switched from a VGo pump (was coming off as her granddaughter was pulling on it) to basal-bolus insulin. We continued Invokana and added metformin back, at half maximal dose.  Determined that she was initially doing very well on this regimen however,  due to  stress, she stopped all of her medicines (!) for at least the last 4 months. She recently started to feel very poorly, with fatigue, weakness, nausea, and decided to schedule this appointment - We discussed about the fact that she absolutely needs to restart taking her medicines consistently and also start checking her sugars >> she agrees. Needs all her medicines refilled. - I suggested to:  Patient Instructions  Please start: - Lantus 40 units at bedtime. - NovoLog: - 12 units before a smaller meal - 16 units before a larger meal - Invokana 300 mg in am. - Metformin 500 mg 2x a day with meals.   Please return in 1.5 months with your sugar log.   - start checking sugars at different times of the day - check 3 times a day, rotating checks - given new OneTouch Verio Flex meter - advised for yearly eye exams >> she is UTD - Return to clinic in 1.5 mo with sugar log    2. Hypothyroidism - This is uncontrolled due to noncompliance with her levothyroxine. She did not take it at all in the last several months!!! I advised her that this is very dangerous practice and discussed about possible complications. She agrees to restart taking the medicine.  Patient Instructions  Please continue the same dose of Levothyroxine: 212 mcg daily.  Take the thyroid hormone every day, with water, >30 minutes before breakfast, separated by >4 hours from acid reflux medications, calcium, iron, multivitamins.  - We'll need to recheck her thyroid tests at next visit  - I will give her arefill of her levothyroxine   Philemon Kingdom, MD PhD [facility] Endocrinology

## 2016-07-18 ENCOUNTER — Other Ambulatory Visit: Payer: Self-pay

## 2016-07-19 ENCOUNTER — Telehealth: Payer: Self-pay | Admitting: Internal Medicine

## 2016-07-19 ENCOUNTER — Other Ambulatory Visit: Payer: Self-pay

## 2016-07-19 MED ORDER — BASAGLAR KWIKPEN 100 UNIT/ML ~~LOC~~ SOPN: PEN_INJECTOR | SUBCUTANEOUS | 0 refills | Status: DC

## 2016-07-19 NOTE — Telephone Encounter (Signed)
Christy from [facility] calling  about what medication patient needs to be called in please advise [phone] (Phone)

## 2016-07-19 NOTE — Telephone Encounter (Signed)
We can switch to Basaglar, same dose.

## 2016-07-19 NOTE — Telephone Encounter (Signed)
Pt is in need of an alternate to be called into her pharmacy to replace the lantus that is not covered by insurance anymore.

## 2016-07-24 ENCOUNTER — Emergency Department (HOSPITAL_COMMUNITY): Payer: 59

## 2016-07-24 ENCOUNTER — Encounter (HOSPITAL_COMMUNITY): Payer: Self-pay

## 2016-07-24 ENCOUNTER — Inpatient Hospital Stay (HOSPITAL_COMMUNITY)
Admission: EM | Admit: 2016-07-24 | Discharge: 2016-07-26 | DRG: 638 | Disposition: A | Discharge status: Home / Self Care | Payer: 59 | Attending: Internal Medicine | Admitting: Internal Medicine

## 2016-07-24 ENCOUNTER — Other Ambulatory Visit: Payer: Self-pay | Admitting: Family Medicine

## 2016-07-24 DIAGNOSIS — IMO0002 Reserved for concepts with insufficient information to code with codable children: Secondary | ICD-10-CM

## 2016-07-24 DIAGNOSIS — E1165 Type 2 diabetes mellitus with hyperglycemia: Secondary | ICD-10-CM

## 2016-07-24 DIAGNOSIS — Z79899 Other long term (current) drug therapy: Secondary | ICD-10-CM | POA: Diagnosis not present

## 2016-07-24 DIAGNOSIS — E860 Dehydration: Secondary | ICD-10-CM | POA: Diagnosis present

## 2016-07-24 DIAGNOSIS — Z794 Long term (current) use of insulin: Secondary | ICD-10-CM

## 2016-07-24 DIAGNOSIS — Z9114 Patient's other noncompliance with medication regimen: Secondary | ICD-10-CM | POA: Diagnosis not present

## 2016-07-24 DIAGNOSIS — E038 Other specified hypothyroidism: Secondary | ICD-10-CM | POA: Diagnosis not present

## 2016-07-24 DIAGNOSIS — E876 Hypokalemia: Secondary | ICD-10-CM | POA: Diagnosis not present

## 2016-07-24 DIAGNOSIS — E118 Type 2 diabetes mellitus with unspecified complications: Secondary | ICD-10-CM

## 2016-07-24 DIAGNOSIS — E871 Hypo-osmolality and hyponatremia: Secondary | ICD-10-CM | POA: Diagnosis present

## 2016-07-24 DIAGNOSIS — E785 Hyperlipidemia, unspecified: Secondary | ICD-10-CM | POA: Diagnosis not present

## 2016-07-24 DIAGNOSIS — Z87891 Personal history of nicotine dependence: Secondary | ICD-10-CM | POA: Diagnosis not present

## 2016-07-24 DIAGNOSIS — K580 Irritable bowel syndrome with diarrhea: Secondary | ICD-10-CM | POA: Diagnosis present

## 2016-07-24 DIAGNOSIS — E1142 Type 2 diabetes mellitus with diabetic polyneuropathy: Secondary | ICD-10-CM | POA: Diagnosis present

## 2016-07-24 DIAGNOSIS — D72829 Elevated white blood cell count, unspecified: Secondary | ICD-10-CM | POA: Diagnosis present

## 2016-07-24 DIAGNOSIS — E039 Hypothyroidism, unspecified: Secondary | ICD-10-CM | POA: Diagnosis present

## 2016-07-24 DIAGNOSIS — K219 Gastro-esophageal reflux disease without esophagitis: Secondary | ICD-10-CM | POA: Diagnosis present

## 2016-07-24 DIAGNOSIS — E1310 Other specified diabetes mellitus with ketoacidosis without coma: Secondary | ICD-10-CM | POA: Diagnosis present

## 2016-07-24 DIAGNOSIS — R739 Hyperglycemia, unspecified: Secondary | ICD-10-CM | POA: Diagnosis present

## 2016-07-24 DIAGNOSIS — I129 Hypertensive chronic kidney disease with stage 1 through stage 4 chronic kidney disease, or unspecified chronic kidney disease: Secondary | ICD-10-CM | POA: Diagnosis present

## 2016-07-24 DIAGNOSIS — G43909 Migraine, unspecified, not intractable, without status migrainosus: Secondary | ICD-10-CM | POA: Diagnosis present

## 2016-07-24 DIAGNOSIS — N179 Acute kidney failure, unspecified: Secondary | ICD-10-CM | POA: Diagnosis present

## 2016-07-24 DIAGNOSIS — Z7951 Long term (current) use of inhaled steroids: Secondary | ICD-10-CM

## 2016-07-24 DIAGNOSIS — E1110 Type 2 diabetes mellitus with ketoacidosis without coma: Secondary | ICD-10-CM | POA: Diagnosis present

## 2016-07-24 DIAGNOSIS — E1122 Type 2 diabetes mellitus with diabetic chronic kidney disease: Secondary | ICD-10-CM | POA: Diagnosis present

## 2016-07-24 DIAGNOSIS — N182 Chronic kidney disease, stage 2 (mild): Secondary | ICD-10-CM | POA: Diagnosis present

## 2016-07-24 LAB — MRSA PCR SCREENING: MRSA by PCR: NEGATIVE

## 2016-07-24 LAB — CBC WITH DIFFERENTIAL/PLATELET
Basophils Absolute: 0 10*3/uL (ref 0.0–0.1)
Basophils Relative: 0 %
Eosinophils Absolute: 0 10*3/uL (ref 0.0–0.7)
Eosinophils Relative: 0 %
HCT: 51.1 % — ABNORMAL HIGH (ref 36.0–46.0)
Hemoglobin: 16.8 g/dL — ABNORMAL HIGH (ref 12.0–15.0)
Lymphocytes Relative: 9 %
Lymphs Abs: 1.4 10*3/uL (ref 0.7–4.0)
MCH: 29.8 pg (ref 26.0–34.0)
MCHC: 32.9 g/dL (ref 30.0–36.0)
MCV: 90.6 fL (ref 78.0–100.0)
Monocytes Absolute: 0.5 10*3/uL (ref 0.1–1.0)
Monocytes Relative: 3 %
Neutro Abs: 13.1 10*3/uL — ABNORMAL HIGH (ref 1.7–7.7)
Neutrophils Relative %: 88 %
Platelets: 317 10*3/uL (ref 150–400)
RBC: 5.64 MIL/uL — ABNORMAL HIGH (ref 3.87–5.11)
RDW: 14.3 % (ref 11.5–15.5)
WBC: 15 10*3/uL — ABNORMAL HIGH (ref 4.0–10.5)

## 2016-07-24 LAB — COMPREHENSIVE METABOLIC PANEL
ALT: 20 U/L (ref 14–54)
AST: 30 U/L (ref 15–41)
Albumin: 4.5 g/dL (ref 3.5–5.0)
Alkaline Phosphatase: 80 U/L (ref 38–126)
Anion gap: 20 — ABNORMAL HIGH (ref 5–15)
BUN: 13 mg/dL (ref 6–20)
CO2: 13 mmol/L — ABNORMAL LOW (ref 22–32)
Calcium: 9.5 mg/dL (ref 8.9–10.3)
Chloride: 95 mmol/L — ABNORMAL LOW (ref 101–111)
Creatinine, Ser: 1.23 mg/dL — ABNORMAL HIGH (ref 0.44–1.00)
GFR calc Af Amer: 57 mL/min — ABNORMAL LOW (ref >60)
GFR calc non Af Amer: 49 mL/min — ABNORMAL LOW (ref >60)
Glucose, Bld: 949 mg/dL (ref 65–99)
Potassium: 4.1 mmol/L (ref 3.5–5.1)
Sodium: 128 mmol/L — ABNORMAL LOW (ref 135–145)
Total Bilirubin: 0.6 mg/dL (ref 0.3–1.2)
Total Protein: 7.9 g/dL (ref 6.5–8.1)

## 2016-07-24 LAB — URINALYSIS, ROUTINE W REFLEX MICROSCOPIC
Bilirubin Urine: NEGATIVE
Glucose, UA: >1000 mg/dL — AB
Ketones, ur: 15 mg/dL — AB
Leukocytes, UA: NEGATIVE
Nitrite: NEGATIVE
Protein, ur: 30 mg/dL — AB
Specific Gravity, Urine: 1.038 — ABNORMAL HIGH (ref 1.005–1.030)
pH: 5 (ref 5.0–8.0)

## 2016-07-24 LAB — BLOOD GAS, VENOUS

## 2016-07-24 LAB — BASIC METABOLIC PANEL
Anion gap: 10 (ref 5–15)
Anion gap: 9 (ref 5–15)
BUN: 9 mg/dL (ref 6–20)
BUN: 11 mg/dL (ref 6–20)
CHLORIDE: 108 mmol/L (ref 101–111)
CHLORIDE: 107 mmol/L (ref 101–111)
CO2: 19 mmol/L — ABNORMAL LOW (ref 22–32)
CO2: 22 mmol/L (ref 22–32)
CREATININE: 0.68 mg/dL (ref 0.44–1.00)
CREATININE: 0.62 mg/dL (ref 0.44–1.00)
Calcium: 8.6 mg/dL — ABNORMAL LOW (ref 8.9–10.3)
Calcium: 8.6 mg/dL — ABNORMAL LOW (ref 8.9–10.3)
GFR calc Af Amer: >60 mL/min (ref >60)
GFR calc Af Amer: >60 mL/min (ref >60)
GFR calc non Af Amer: >60 mL/min (ref >60)
GFR calc non Af Amer: >60 mL/min (ref >60)
Glucose, Bld: 261 mg/dL — ABNORMAL HIGH (ref 65–99)
Glucose, Bld: 130 mg/dL — ABNORMAL HIGH (ref 65–99)
Potassium: 3.2 mmol/L — ABNORMAL LOW (ref 3.5–5.1)
Potassium: 2.9 mmol/L — ABNORMAL LOW (ref 3.5–5.1)
SODIUM: 137 mmol/L (ref 135–145)
SODIUM: 138 mmol/L (ref 135–145)

## 2016-07-24 LAB — I-STAT VENOUS BLOOD GAS, ED
Acid-base deficit: 11 mmol/L — ABNORMAL HIGH (ref 0.0–2.0)
Bicarbonate: 14 mEq/L — ABNORMAL LOW (ref 20.0–24.0)
O2 SAT: 94 %
PCO2 VEN: 29.5 mmHg — AB (ref 45.0–50.0)
PH VEN: 7.284 (ref 7.250–7.300)
TCO2: 15 mmol/L (ref 0–100)
pO2, Ven: 78 mmHg — ABNORMAL HIGH (ref 31.0–45.0)

## 2016-07-24 LAB — URINE MICROSCOPIC-ADD ON: Bacteria, UA: NONE SEEN

## 2016-07-24 LAB — CBG MONITORING, ED
GLUCOSE-CAPILLARY: 336 mg/dL — AB (ref 65–99)
GLUCOSE-CAPILLARY: 228 mg/dL — AB (ref 65–99)
GLUCOSE-CAPILLARY: 209 mg/dL — AB (ref 65–99)
GLUCOSE-CAPILLARY: 241 mg/dL — AB (ref 65–99)
Glucose-Capillary: >600 mg/dL (ref 65–99)
Glucose-Capillary: 352 mg/dL — ABNORMAL HIGH (ref 65–99)
Glucose-Capillary: 180 mg/dL — ABNORMAL HIGH (ref 65–99)

## 2016-07-24 LAB — POC OCCULT BLOOD, ED: FECAL OCCULT BLD: NEGATIVE

## 2016-07-24 LAB — I-STAT TROPONIN, ED
Troponin i, poc: 0.01 ng/mL (ref 0.00–0.08)
Troponin i, poc: 0 ng/mL (ref 0.00–0.08)

## 2016-07-24 LAB — GLUCOSE, CAPILLARY
GLUCOSE-CAPILLARY: 131 mg/dL — AB (ref 65–99)
Glucose-Capillary: 140 mg/dL — ABNORMAL HIGH (ref 65–99)
Glucose-Capillary: 148 mg/dL — ABNORMAL HIGH (ref 65–99)
Glucose-Capillary: 127 mg/dL — ABNORMAL HIGH (ref 65–99)

## 2016-07-24 LAB — LIPASE, BLOOD: Lipase: 38 U/L (ref 11–51)

## 2016-07-24 MED ORDER — POTASSIUM CHLORIDE 10 MEQ/100ML IV SOLN: 10.0000 meq | INTRAVENOUS | Status: AC

## 2016-07-24 MED ORDER — LORATADINE 10 MG PO TABS
10.0000 mg | ORAL_TABLET | Freq: Every day | ORAL | Status: DC
  Administered 2016-07-25 – 2016-07-26 (×2): 10 mg via ORAL
  Filled 2016-07-24 (×2): qty 1

## 2016-07-24 MED ORDER — DEXTROSE-NACL 5-0.45 % IV SOLN
INTRAVENOUS | Status: DC
  Administered 2016-07-24: 17:00:00 via INTRAVENOUS

## 2016-07-24 MED ORDER — DEXTROSE-NACL 5-0.45 % IV SOLN: INTRAVENOUS | Status: DC

## 2016-07-24 MED ORDER — ONDANSETRON HCL 4 MG/2ML IJ SOLN: 4.0000 mg | Freq: Once | INTRAMUSCULAR | Status: DC

## 2016-07-24 MED ORDER — SODIUM CHLORIDE 0.9 % IV SOLN
INTRAVENOUS | Status: DC
  Administered 2016-07-24: 17:00:00 via INTRAVENOUS
  Filled 2016-07-24: qty 2.5

## 2016-07-24 MED ORDER — ONDANSETRON 8 MG PO TBDP
8.0000 mg | ORAL_TABLET | Freq: Three times a day (TID) | ORAL | Status: DC | PRN
  Filled 2016-07-24: qty 1

## 2016-07-24 MED ORDER — ATORVASTATIN CALCIUM 80 MG PO TABS
80.0000 mg | ORAL_TABLET | Freq: Every day | ORAL | Status: DC
  Administered 2016-07-25 – 2016-07-26 (×2): 80 mg via ORAL
  Filled 2016-07-24 (×2): qty 1

## 2016-07-24 MED ORDER — SODIUM CHLORIDE 0.9 % IV SOLN: INTRAVENOUS | Status: DC

## 2016-07-24 MED ORDER — LEVOTHYROXINE SODIUM 25 MCG PO TABS
212.5000 ug | ORAL_TABLET | Freq: Every day | ORAL | Status: DC
  Filled 2016-07-24: qty 1

## 2016-07-24 MED ORDER — SODIUM CHLORIDE 0.9 % IV SOLN
INTRAVENOUS | Status: DC
  Administered 2016-07-24: 5.4 [IU]/h via INTRAVENOUS
  Filled 2016-07-24: qty 2.5

## 2016-07-24 MED ORDER — SODIUM CHLORIDE 0.9 % IV BOLUS (SEPSIS)
1000.0000 mL | Freq: Once | INTRAVENOUS | Status: AC
  Administered 2016-07-24: 1000 mL via INTRAVENOUS

## 2016-07-24 MED ORDER — ELETRIPTAN HYDROBROMIDE 40 MG PO TABS
40.0000 mg | ORAL_TABLET | ORAL | Status: DC | PRN
  Administered 2016-07-24 – 2016-07-25 (×2): 40 mg via ORAL
  Filled 2016-07-24 (×3): qty 1

## 2016-07-24 MED ORDER — SODIUM CHLORIDE 0.9% FLUSH
3.0000 mL | Freq: Two times a day (BID) | INTRAVENOUS | Status: DC
  Administered 2016-07-25 – 2016-07-26 (×3): 3 mL via INTRAVENOUS

## 2016-07-24 MED ORDER — ONDANSETRON HCL 4 MG/2ML IJ SOLN
4.0000 mg | Freq: Three times a day (TID) | INTRAMUSCULAR | Status: AC | PRN
  Administered 2016-07-24 – 2016-07-25 (×2): 4 mg via INTRAVENOUS
  Filled 2016-07-24 (×2): qty 2

## 2016-07-24 MED ORDER — MORPHINE SULFATE (PF) 4 MG/ML IV SOLN
4.0000 mg | Freq: Once | INTRAVENOUS | Status: AC
  Administered 2016-07-24: 4 mg via INTRAVENOUS
  Filled 2016-07-24: qty 1

## 2016-07-24 MED ORDER — ONDANSETRON HCL 4 MG/2ML IJ SOLN
4.0000 mg | Freq: Once | INTRAMUSCULAR | Status: AC
  Administered 2016-07-24: 4 mg via INTRAVENOUS
  Filled 2016-07-24: qty 2

## 2016-07-24 MED ORDER — LEVOTHYROXINE SODIUM 200 MCG PO TABS: 200.0000 ug | ORAL_TABLET | Freq: Every day | ORAL | Status: DC

## 2016-07-24 MED ORDER — ENOXAPARIN SODIUM 40 MG/0.4ML ~~LOC~~ SOLN
40.0000 mg | SUBCUTANEOUS | Status: DC
  Administered 2016-07-25: 40 mg via SUBCUTANEOUS
  Filled 2016-07-24 (×2): qty 0.4

## 2016-07-24 MED ORDER — SODIUM CHLORIDE 0.9 % IV SOLN
INTRAVENOUS | Status: DC
  Administered 2016-07-24: 125 mL/h via INTRAVENOUS

## 2016-07-24 MED ORDER — SODIUM CHLORIDE 0.9 % IV BOLUS (SEPSIS)
1000.0000 mL | Freq: Once | INTRAVENOUS | Status: AC
Start: 2016-07-24 — End: 2016-07-24
  Administered 2016-07-24: 1000 mL via INTRAVENOUS

## 2016-07-24 MED ORDER — SODIUM BICARBONATE 8.4 % IV SOLN: 50.0000 meq | Freq: Once | INTRAVENOUS | Status: DC

## 2016-07-24 MED ORDER — LEVOTHYROXINE SODIUM 25 MCG PO TABS
212.5000 ug | ORAL_TABLET | Freq: Every day | ORAL | Status: DC
  Administered 2016-07-25 – 2016-07-26 (×2): 212.5 ug via ORAL
  Filled 2016-07-24 (×2): qty 1

## 2016-07-24 NOTE — ED Notes (Signed)
CBG 209. 

## 2016-07-24 NOTE — ED Notes (Signed)
Cybill, RN , calls from floor and states there is no bed in room. States she will call back when bed is in place.

## 2016-07-24 NOTE — ED Notes (Signed)
Pt c/o shaking. CBG rechecked

## 2016-07-24 NOTE — H&P (Signed)
History and Physical    [name] [name] [id number] DOB: May 07, 1961 DOA: 07/24/2016  PCP: Eliezer Lofts, MD Patient coming from: home  Chief Complaint: Hyperglycemia, CP, malaise, nausea/emesis  HPI: [name] [name] is a 55 y.o. female with medical history significant of anxiety, B12 deficiency, GERD, HLD, HTN, hypothyroidism, IBS, C KD, diabetes. Presenting with profound hyperglycemia with 2 day history of malaise, nausea or vomiting. Patient developed right-sided chest pain on 07/24/2016 after violent emesis episode. This is typically nonexistent when patient is not moving but returns with deep respirations or outpatient of chest wall or sleeping on right side. The patient states that she cannot involve her diabetic medication 6 months ago she felt that she was doing well from a health standpoint no longer needed those medicines. She did not check her blood sugars until approximately 2 weeks ago when she was seen by her primary care physician and told that her A1c was 14.1. At that time she would check her blood sugars regularly which run in the 200-500 range. Patient was restarted at time of PCPs visit on her insulin, Victoza and metformin. Patient endorses compliance with this new regimen of Lantus 40 units daily at bedtime and 12-16 units 3 times a day before meals of NovoLog. Patient states that over the last 2 days she has developed worsening malaise nausea and emesis. Home sugars have remained 500 600 or above during this time. Also endorses polyuria but denies dysuria, back pain, fevers, shortness of breath, palpitations, neck stiffness, fevers, productive cough, lower external edema.   ED Course: Objective findings below. Started on [employer].  Review of Systems: As per HPI otherwise 10 point review of systems negative.   Ambulatory Status: No restrictions  Past Medical History:  Diagnosis Date  . Anxiety   . B12 deficiency   . GERD (gastroesophageal reflux disease)   .  Hyperlipidemia   . Hypertension   . Hypothyroidism   . IBS (irritable bowel syndrome)   . Intermittent vertigo   . 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 Leg cramping    "@ night" (09/18/2013)  . Migraine    "once q couple months" (09/18/2013)  . Osteoporosis   . Peripheral neuropathy ([place])   . Type II diabetes mellitus ([place])   . Umbilical hernia    unrepaired (09/18/2013)    Past Surgical History:  Procedure Laterality Date  . CESAREAN SECTION  1989  . LAPAROSCOPIC ASSISTED VAGINAL HYSTERECTOMY  09/23/2012   Procedure: LAPAROSCOPIC ASSISTED VAGINAL HYSTERECTOMY;  Surgeon: Linda Hedges, DO;  Location: [facility];  Service: Gynecology;  Laterality: N/A;  pull Dr Gregor Hams instrument  . TUBAL LIGATION Bilateral 1992  . VAGINAL HYSTERECTOMY  2013    Social History   Social History  . Marital status: Married    Spouse name: N/A  . Number of children: 2  . Years of education: N/A   Occupational History  . UNEMPLOYED Unemployed    homemaker   Social History Main Topics  . Smoking status: Former Smoker    Years: 0.20    Types: Cigarettes    Quit date: 12/10/1990  . Smokeless tobacco: Never Used  . Alcohol use No  . Drug use: No  . Sexual activity: Yes   Other Topics Concern  . Not on file   Social History Narrative   Regular exercise- no    Diet- lots of mountain dew, limits fast foods     Allergies  Allergen Reactions  . Codeine Hives,  Anxiety and Other (See Comments)    REACTION: N \\T \ V and hyper. Pt can take Percocet.    Family History  Problem Relation Age of Onset  . Diabetes Other   . Heart disease Other   . Heart failure Mother   . Diabetes Maternal Grandmother   . Alzheimer's disease Maternal Grandmother   . Coronary artery disease Maternal Grandmother   . Heart attack Maternal Grandfather   . Heart failure Brother     Prior to Admission medications   Medication Sig Start Date End Date Taking? Authorizing Provider    atorvastatin (LIPITOR) 80 MG tablet TAKE 1 TABLET (80 MG TOTAL) BY MOUTH DAILY. 04/29/15  Yes Amy Cletis Athens, MD  canagliflozin (INVOKANA) 300 MG TABS tablet Take 1 tablet (300 mg total) by mouth daily before breakfast. 07/17/16  Yes Philemon Kingdom, MD  cetirizine (ZYRTEC) 10 MG tablet Take 1 tablet (10 mg total) by mouth daily. 04/24/16  Yes Jearld Fenton, NP  eletriptan (RELPAX) 40 MG tablet One tablet by mouth at onset of headache. May repeat in 2 hours if headache persists or recurs. 02/01/15  Yes Amy E Diona Browner, MD  fluticasone (FLONASE) 50 MCG/ACT nasal spray Place 2 sprays into both nostrils daily. 04/24/16  Yes Jearld Fenton, NP  glucose blood test strip Use 3x a day - One Touch Verio Flex meter 07/17/16  Yes Philemon Kingdom, MD  Insulin Glargine (BASAGLAR KWIKPEN) 100 UNIT/ML SOPN Inject 40 units into the skin daily at 10 PM. 07/19/16  Yes Philemon Kingdom, MD  insulin lispro (HUMALOG KWIKPEN) 100 UNIT/ML KiwkPen Inject 0.12-0.16 mLs (12-16 Units total) into the skin 3 (three) times daily. 07/17/16  Yes Philemon Kingdom, MD  Insulin Pen Needle (CAREFINE PEN NEEDLES) 32G X 4 MM MISC Use 4x a day 07/17/16  Yes Philemon Kingdom, MD  levothyroxine (SYNTHROID, LEVOTHROID) 200 MCG tablet TAKE 1 TABLET BY MOUTH DAILY TAKE ALONG WITH THE 12.5 MG DOSE. 07/17/16  Yes Philemon Kingdom, MD  levothyroxine (SYNTHROID, LEVOTHROID) 25 MCG tablet Take 0.5 tablets (12.5 mcg total) by mouth daily before breakfast. In addition to 200 mcg daily for TDD 212.5 mcg 07/17/16  Yes Philemon Kingdom, MD  metFORMIN (GLUCOPHAGE) 500 MG tablet Take 1 tablet (500 mg total) by mouth 2 (two) times daily with a meal. 07/17/16  Yes Philemon Kingdom, MD  ondansetron (ZOFRAN-ODT) 8 MG disintegrating tablet Take 8 mg by mouth every 8 (eight) hours as needed for nausea or vomiting.   Yes Historical Provider, MD  ONE TOUCH LANCETS MISC Use 3x a day - One Touch Verio Flex meter 07/17/16  Yes Philemon Kingdom, MD  sodium chloride (OCEAN) 0.65 % nasal  spray 2 sprays per nostril  3 times daily prn congestion 02/01/15  Yes Amy E Bedsole, MD  meloxicam (MOBIC) 15 MG tablet Take 1 tablet (15 mg total) by mouth daily as needed for pain (back pain). Take with a meal Patient not taking: Reported on 07/17/2016 01/04/16   Abner Greenspan, MD    Physical Exam: Vitals:   07/24/16 1515 07/24/16 1530 07/24/16 1602 07/24/16 1607  BP: 102/66 111/69 105/58 105/58  Pulse: 98 92 90 87  Resp: 17 24 12 10   Temp:      TempSrc:      SpO2: 97% 99% 98% 97%  Weight:      Height:         General:  Appears calm and comfortable Eyes:  PERRL, EOMI, normal lids, iris ENT:  grossly normal hearing,  lips & tongue, mmm Neck:  no LAD, masses or thyromegaly Cardiovascular:  RRR, no m/r/g. No LE edema.  Respiratory: Tachypnea, CTA bilaterally, no w/r/r. Normal respiratory effort. Abdomen:  soft, ntnd, NABS Skin:  no rash or induration seen on limited exam Musculoskeletal:  grossly normal tone BUE/BLE, good ROM, no bony abnormality Psychiatric:  grossly normal mood and affect, speech fluent and appropriate, AOx3 Neurologic:  CN 2-12 grossly intact, moves all extremities in coordinated fashion, sensation intact  Labs on Admission: I have personally reviewed following labs and imaging studies  CBC:  Recent Labs Lab 07/24/16 1145  WBC 15.0*  NEUTROABS 13.1*  HGB 16.8*  HCT 51.1*  MCV 90.6  PLT [id number]   Basic Metabolic Panel:  Recent Labs Lab 07/24/16 1145  NA 128*  K 4.1  CL 95*  CO2 13*  GLUCOSE 949*  BUN 13  CREATININE 1.23*  CALCIUM 9.5   GFR: Estimated Creatinine Clearance: 45.8 mL/min (by C-G formula based on SCr of 1.23 mg/dL). Liver Function Tests:  Recent Labs Lab 07/24/16 1145  AST 30  ALT 20  ALKPHOS 80  BILITOT 0.6  PROT 7.9  ALBUMIN 4.5    Recent Labs Lab 07/24/16 1145  LIPASE 38   No results for input(s): AMMONIA in the last 168 hours. Coagulation Profile: No results for input(s): INR, PROTIME in the last 168  hours. Cardiac Enzymes: No results for input(s): CKTOTAL, CKMB, CKMBINDEX, TROPONINI in the last 168 hours. BNP (last 3 results) No results for input(s): PROBNP in the last 8760 hours. HbA1C: No results for input(s): HGBA1C in the last 72 hours. CBG:  Recent Labs Lab 07/24/16 1120 07/24/16 1227 07/24/16 1352 07/24/16 1456 07/24/16 1605  GLUCAP >600* >600* >600* 352* 336*   Lipid Profile: No results for input(s): CHOL, HDL, LDLCALC, TRIG, CHOLHDL, LDLDIRECT in the last 72 hours. Thyroid Function Tests: No results for input(s): TSH, T4TOTAL, FREET4, T3FREE, THYROIDAB in the last 72 hours. Anemia Panel: No results for input(s): VITAMINB12, FOLATE, FERRITIN, TIBC, IRON, RETICCTPCT in the last 72 hours. Urine analysis:    Component Value Date/Time   COLORURINE YELLOW 07/24/2016 [place] 07/24/2016 1205   LABSPEC 1.038 (H) 07/24/2016 1205   PHURINE 5.0 07/24/2016 1205   GLUCOSEU >1000 (A) 07/24/2016 1205   HGBUR SMALL (A) 07/24/2016 1205   BILIRUBINUR NEGATIVE 07/24/2016 1205   BILIRUBINUR Neg. 01/04/2016 1025   KETONESUR 15 (A) 07/24/2016 1205   PROTEINUR 30 (A) 07/24/2016 1205   UROBILINOGEN 0.2 01/04/2016 1025   UROBILINOGEN 0.2 10/28/2013 1723   NITRITE NEGATIVE 07/24/2016 1205   LEUKOCYTESUR NEGATIVE 07/24/2016 1205    Creatinine Clearance: Estimated Creatinine Clearance: 45.8 mL/min (by C-G formula based on SCr of 1.23 mg/dL).  Sepsis Labs: @LABRCNTIP (procalcitonin:4,lacticidven:4) )No results found for this or any previous visit (from the past 240 hour(s)).   Radiological Exams on Admission: Dg Chest 2 View  Result Date: 07/24/2016 CLINICAL DATA:  Right-sided chest pain with nausea today. Hyperglycemia. Former smoker. EXAM: CHEST  2 VIEW COMPARISON:  01/27/2015 FINDINGS: The cardiomediastinal silhouette is within normal limits. The lungs are well inflated and clear. There is no evidence of pleural effusion or pneumothorax. No acute osseous  abnormality is identified. IMPRESSION: No active cardiopulmonary disease. Electronically Signed   By: Logan Bores M.D.   On: 07/24/2016 12:15      Assessment/Plan Active Problems:   Hypothyroidism   Type 2 diabetes mellitus, uncontrolled, with renal complications ([name])   Hyperlipidemia   GERD   Leucocytosis  DKA, type 2 ([place])   Hyponatremia   AKI (acute kidney injury) ([place])   DKA: Stopped taking diabetic medicines 6 months ago, restarted 2 weeks ago when A1c was noted to be 14 at PCPs office. Sugars have remained high since restarting medicines. Today VBG showing pH 7.28, PCO2 29.6, PO2 78, bicarbonate 14, glucose 949, anion gap 20+, troponin negative, WBC 15. Start iron glucose stabilizer in ED with rapid improvement in glucose area currently 336. - Stepdown - continue glucose stabilizer - DM educator - 1 amp bicarb - Resume home Lantus and Novolog when gap closes x2 w/ education to be provided regarding how to titrate dose when going home.  - Lactic acid  AKI: Cr 1.29. Baseline 0.65. Likely from dehydration in setting of DKA - IVF - BMET in am  Chest wall pain: likely MSK/pulled muscle from emesis. Reproducible on palpation and w/ movement. Improving w/ rest. ASA earlier w/o improvement. Trop neg. EKG w/o ACS. - Voltaren gel  Psdudohyponatremia: 128 on admission w/ NA 949 = corrected Na of 142.  - Treatment as above  Hypothyroid: - continue synthroid  HLD: - continue lipitor  HA: - continue Relpax   DVT prophylaxis: Lovenox  Code Status: full  Family Communication: none  Disposition Plan: pending improvemetn  Consults called: none  Admission status: SDU - inpt    Deneice Wack J MD [facility]  If 7PM-7AM, please contact night-coverage [email or web address] Password [facility]  07/24/2016, 4:37 PM

## 2016-07-24 NOTE — ED Notes (Signed)
Pt produced 275 cc's of urine while on a bed pan.

## 2016-07-24 NOTE — ED Notes (Signed)
Pt's CBG was "HI:  Exceeds measure > 600".

## 2016-07-24 NOTE — ED Notes (Signed)
CBG >600; RN notified 

## 2016-07-24 NOTE — ED Notes (Signed)
Pharmacy contacted to obtain medications.

## 2016-07-24 NOTE — ED Provider Notes (Signed)
[facility] DEPT Provider Note   CSN: [id number] Arrival date & time: 07/24/16  1114     History   Chief Complaint Chief Complaint  Patient presents with  . Nausea  . Emesis  . Chest Pain    HPI [name] [name] is a 55 y.o. female with history of type 2 diabetes who presents with hyperglycemia and a one-day history of nausea, vomiting, diarrhea, lower abdominal pain. Patient states she began with a migraine yesterday which resolved, and she began feeling nauseated. Patient reports that she had very dark, almost black diarrhea, which is now clear. Patient denies any hematemesis, however states she saw black specks in her vomit. Patient states she used over-the-counter nausea medications morning, but could not keep them down. Patient sipped Coke overnight without relief. Patient reports associated night sweats last night,But no fevers. Patient states that she has been off her diabetic medications including insulin for the past 6 months. Patient restarted her insulin on Tuesday. Her last dose of insulin was yesterday at lunchtime. Patient also reports a right-sided, and now all over, chest heaviness that began in the ambulance. Her chest pain does not radiate. Patient reported that she felt a throbbing pain to her right chest last evening. Patient denies shortness of breath or any urinary symptoms.Patient denies any recent long trips, estrogen use, recent surgery or cancer, leg pain or swelling.  HPI  Past Medical History:  Diagnosis Date  . Anxiety   . B12 deficiency   . GERD (gastroesophageal reflux disease)   . Hyperlipidemia   . Hypertension   . Hypothyroidism   . IBS (irritable bowel syndrome)   . Intermittent vertigo   . 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 Leg cramping    "@ night" (09/18/2013)  . Migraine    "once q couple months" (09/18/2013)  . Osteoporosis   . Peripheral neuropathy ([place])   . Type II diabetes mellitus ([place])   . Umbilical  hernia    unrepaired (09/18/2013)    Patient Active Problem List   Diagnosis Date Noted  . DKA, type 2 ([place]) 07/24/2016  . Type 2 diabetes mellitus with hyperglycemia, with long-term current use of insulin ([employer]) 07/17/2016  . Lumbar pain 01/04/2016  . Atrial fibrillation ([place]) 02/01/2015  . Breast pain, left 01/25/2015  . Bursitis of right shoulder 01/10/2015  . Abdominal pain, left lateral 06/04/2014  . 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 Colitis, indeterminate 09/18/2013  . Hepatic lesion 09/18/2013  . Diverticulosis 09/18/2013  . Leucocytosis 08/25/2013  . Vitamin D deficiency 05/20/2012  . TOBACCO ABUSE 02/19/2011  . GLAUCOMA 02/01/2011  . Essential hypertension 02/01/2011  . Osteoporosis 02/01/2011  . INTERMITTENT VERTIGO 12/15/2010  . HEMATOCHEZIA 08/15/2010  . B12 deficiency 04/04/2010  . PERIPHERAL NEUROPATHY 05/10/2009  . GERD 04/08/2009  . Type 2 diabetes mellitus, uncontrolled, with renal complications ([place]) [id number]  . ANXIETY DISORDER, GENERALIZED 06/22/2008  . Hypothyroidism 04/29/2008  . Hyperlipidemia 04/29/2008  . Migraine without aura 04/29/2008  . ALLERGIC RHINITIS 04/29/2008  . Irritable bowel syndrome 04/29/2008    Past Surgical History:  Procedure Laterality Date  . CESAREAN SECTION  1989  . LAPAROSCOPIC ASSISTED VAGINAL HYSTERECTOMY  09/23/2012   Procedure: LAPAROSCOPIC ASSISTED VAGINAL HYSTERECTOMY;  Surgeon: Linda Hedges, DO;  Location: [facility];  Service: Gynecology;  Laterality: N/A;  pull Dr Gregor Hams instrument  . TUBAL LIGATION Bilateral 1992  . VAGINAL HYSTERECTOMY  2013    OB History  Gravida Para Term Preterm AB Living   2 2           SAB TAB Ectopic Multiple Live Births                   Home Medications    Prior to Admission medications   Medication Sig Start Date End Date Taking? Authorizing Provider  atorvastatin (LIPITOR) 80 MG tablet TAKE 1 TABLET (80 MG TOTAL) BY MOUTH DAILY. 04/29/15  Yes  Amy Cletis [place], MD  canagliflozin (INVOKANA) 300 MG TABS tablet Take 1 tablet (300 mg total) by mouth daily before breakfast. 07/17/16  Yes Philemon Kingdom, MD  cetirizine (ZYRTEC) 10 MG tablet Take 1 tablet (10 mg total) by mouth daily. 04/24/16  Yes Jearld Fenton, NP  eletriptan (RELPAX) 40 MG tablet One tablet by mouth at onset of headache. May repeat in 2 hours if headache persists or recurs. 02/01/15  Yes Amy E Diona Browner, MD  fluticasone (FLONASE) 50 MCG/ACT nasal spray Place 2 sprays into both nostrils daily. 04/24/16  Yes Jearld Fenton, NP  glucose blood test strip Use 3x a day - One Touch Verio Flex meter 07/17/16  Yes Philemon Kingdom, MD  Insulin Glargine (BASAGLAR KWIKPEN) 100 UNIT/ML SOPN Inject 40 units into the skin daily at 10 PM. 07/19/16  Yes Philemon Kingdom, MD  insulin lispro (HUMALOG KWIKPEN) 100 UNIT/ML KiwkPen Inject 0.12-0.16 mLs (12-16 Units total) into the skin 3 (three) times daily. 07/17/16  Yes Philemon Kingdom, MD  Insulin Pen Needle (CAREFINE PEN NEEDLES) 32G X 4 MM MISC Use 4x a day 07/17/16  Yes Philemon Kingdom, MD  levothyroxine (SYNTHROID, LEVOTHROID) 200 MCG tablet TAKE 1 TABLET BY MOUTH DAILY TAKE ALONG WITH THE 12.5 MG DOSE. 07/17/16  Yes Philemon Kingdom, MD  levothyroxine (SYNTHROID, LEVOTHROID) 25 MCG tablet Take 0.5 tablets (12.5 mcg total) by mouth daily before breakfast. In addition to 200 mcg daily for TDD 212.5 mcg 07/17/16  Yes Philemon Kingdom, MD  metFORMIN (GLUCOPHAGE) 500 MG tablet Take 1 tablet (500 mg total) by mouth 2 (two) times daily with a meal. 07/17/16  Yes Philemon Kingdom, MD  ondansetron (ZOFRAN-ODT) 8 MG disintegrating tablet Take 8 mg by mouth every 8 (eight) hours as needed for nausea or vomiting.   Yes Historical Provider, MD  ONE TOUCH LANCETS MISC Use 3x a day - One Touch Verio Flex meter 07/17/16  Yes Philemon Kingdom, MD  sodium chloride (OCEAN) 0.65 % nasal spray 2 sprays per nostril  3 times daily prn congestion 02/01/15  Yes Amy E Bedsole, MD    meloxicam (MOBIC) 15 MG tablet Take 1 tablet (15 mg total) by mouth daily as needed for pain (back pain). Take with a meal Patient not taking: Reported on 07/17/2016 01/04/16   Abner Greenspan, MD    Family History Family History  Problem Relation Age of Onset  . Diabetes Other   . Heart disease Other   . Heart failure Mother   . Diabetes Maternal Grandmother   . Alzheimer's disease Maternal Grandmother   . Coronary artery disease Maternal Grandmother   . Heart attack Maternal Grandfather   . Heart failure Brother     Social History Social History  Substance Use Topics  . Smoking status: Former Smoker    Years: 0.20    Types: Cigarettes    Quit date: 12/10/1990  . Smokeless tobacco: Never Used  . Alcohol use No     Allergies   Codeine   Review of  Systems Review of Systems  Constitutional: Negative for chills and fever.  HENT: Negative for facial swelling.   Respiratory: Negative for shortness of breath.   Cardiovascular: Positive for chest pain.  Gastrointestinal: Positive for abdominal pain, diarrhea, nausea and vomiting.  Genitourinary: Negative for dysuria.  Musculoskeletal: Negative for back pain.  Skin: Negative for rash and wound.  Neurological: Positive for headaches (yesterday, now resolved).  Psychiatric/Behavioral: The patient is not nervous/anxious.      Physical Exam Updated Vital Signs BP 106/72   Pulse 88   Temp 98 F (36.7 C) (Oral)   Resp 15   Ht 5' (1.524 m)   Wt 68.9 kg   LMP 08/11/2012   SpO2 98%   BMI 29.69 kg/m   Physical Exam  Constitutional: She appears well-developed and well-nourished. No distress.  HENT:  Head: Normocephalic and atraumatic.  Mouth/Throat: Oropharynx is clear and moist. No oropharyngeal exudate.  Eyes: Conjunctivae are normal. Pupils are equal, round, and reactive to light. Right eye exhibits no discharge. Left eye exhibits no discharge. No scleral icterus.  Neck: Normal range of motion. Neck supple. No  thyromegaly present.  Cardiovascular: Normal rate, regular rhythm, normal heart sounds and intact distal pulses.  Exam reveals no gallop and no friction rub.   No murmur heard. Pulmonary/Chest: Effort normal and breath sounds normal. No stridor. No respiratory distress. She has no wheezes. She has no rales. She exhibits tenderness (R sided).    Abdominal: Soft. Bowel sounds are normal. She exhibits no distension. There is tenderness in the right upper quadrant, suprapubic area and left lower quadrant. There is no rebound, no guarding and no tenderness at McBurney's point.  Musculoskeletal: She exhibits no edema.  No calf tenderness to palpation bilaterally  Lymphadenopathy:    She has no cervical adenopathy.  Neurological: She is alert. Coordination normal.  Skin: Skin is warm and dry. No rash noted. She is not diaphoretic. No pallor.  Psychiatric: She has a normal mood and affect.  Nursing note and vitals reviewed.    ED Treatments / Results  Labs (all labs ordered are listed, but only abnormal results are displayed) Labs Reviewed  CBC WITH DIFFERENTIAL/PLATELET - Abnormal; Notable for the following:       Result Value   WBC 15.0 (*)    RBC 5.64 (*)    Hemoglobin 16.8 (*)    HCT 51.1 (*)    Neutro Abs 13.1 (*)    All other components within normal limits  COMPREHENSIVE METABOLIC PANEL - Abnormal; Notable for the following:    Sodium 128 (*)    Chloride 95 (*)    CO2 13 (*)    Glucose, Bld 949 (*)    Creatinine, Ser 1.23 (*)    GFR calc non Af Amer 49 (*)    GFR calc Af Amer 57 (*)    Anion gap 20 (*)    All other components within normal limits  URINALYSIS, ROUTINE W REFLEX MICROSCOPIC (NOT AT [facility]) - Abnormal; Notable for the following:    Specific Gravity, Urine 1.038 (*)    Glucose, UA >1000 (*)    Hgb urine dipstick SMALL (*)    Ketones, ur 15 (*)    Protein, ur 30 (*)    All other components within normal limits  URINE MICROSCOPIC-ADD ON - Abnormal; Notable for  the following:    Squamous Epithelial / LPF 0-5 (*)    All other components within normal limits  CBG MONITORING, ED - Abnormal; Notable  for the following:    Glucose-Capillary >600 (*)    All other components within normal limits  I-STAT VENOUS BLOOD GAS, ED - Abnormal; Notable for the following:    pCO2, Ven 29.5 (*)    pO2, Ven 78.0 (*)    Bicarbonate 14.0 (*)    Acid-base deficit 11.0 (*)    All other components within normal limits  CBG MONITORING, ED - Abnormal; Notable for the following:    Glucose-Capillary >600 (*)    All other components within normal limits  CBG MONITORING, ED - Abnormal; Notable for the following:    Glucose-Capillary >600 (*)    All other components within normal limits  CBG MONITORING, ED - Abnormal; Notable for the following:    Glucose-Capillary 352 (*)    All other components within normal limits  LIPASE, BLOOD  BLOOD GAS, VENOUS  I-STAT TROPOININ, ED  POC OCCULT BLOOD, ED  I-STAT TROPOININ, ED    EKG  EKG Interpretation  Date/Time:  Tuesday July 24 2016 11:17:31 EDT Ventricular Rate:  95 PR Interval:    QRS Duration: 80 QT Interval:  363 QTC Calculation: 457 R Axis:   95 Text Interpretation:  Sinus rhythm Borderline right axis deviation Anteroseptal infarct, old T wave abnormality Abnormal ekg Confirmed by Carmin Muskrat  MD [id number]) on 07/24/2016 11:25:59 AM Also confirmed by Carmin Muskrat  MD ([id number]), editor [place] [facility], Leda Gauze [phone])  on 07/24/2016 12:11:29 PM       Radiology Dg Chest 2 View  Result Date: 07/24/2016 CLINICAL DATA:  Right-sided chest pain with nausea today. Hyperglycemia. Former smoker. EXAM: CHEST  2 VIEW COMPARISON:  01/27/2015 FINDINGS: The cardiomediastinal silhouette is within normal limits. The lungs are well inflated and clear. There is no evidence of pleural effusion or pneumothorax. No acute osseous abnormality is identified. IMPRESSION: No active cardiopulmonary disease. Electronically Signed   By: Logan Bores M.D.   On: 07/24/2016 12:15    Procedures Procedures (including critical care time)  Medications Ordered in ED Medications  dextrose 5 %-0.45 % sodium chloride infusion (not administered)  insulin regular (NOVOLIN R,HUMULIN R) 250 Units in sodium chloride 0.9 % 250 mL (1 Units/mL) infusion (5.8 Units/hr Intravenous Rate/Dose Change 07/24/16 1502)  sodium chloride 0.9 % bolus 1,000 mL (0 mLs Intravenous Stopped 07/24/16 1343)    And  sodium chloride 0.9 % bolus 1,000 mL (1,000 mLs Intravenous New Bag/Given 07/24/16 1343)    And  0.9 %  sodium chloride infusion (not administered)  morphine 4 MG/ML injection 4 mg (4 mg Intravenous Given 07/24/16 1446)  ondansetron (ZOFRAN) injection 4 mg (4 mg Intravenous Given 07/24/16 1446)     Initial Impression / Assessment and Plan / ED Course  I have reviewed the triage vital signs and the nursing notes.  Pertinent labs & imaging results that were available during my care of the patient were reviewed by me and considered in my medical decision making (see chart for details).  Clinical Course    Patient to be admitted stepdown for DKA. Initial CBG 949. CBC shows WBC 15, stable hemoglobin 16.8. CMP shows sodium 128, chloride 95, CO2 13, creatinine 1.23, anion gap 20. Lipase 38. Patient acidotic with pH of 7.284 on the VBG, venous CO2 29.5, bicarbonate 14, acid-base deficit 11. UA shows >1000 glucose, small hematuria, ketones 15, protein 30, negative leukocytes. Initial troponin 0.01, second pending. CXR shows no active cardiopulmonary disease. EKG shows NSR, borderline right axis ideation. GlucoStabilizer orderset initiated in ED. CBG decreased  to 352 in ED. Chest pain resolving in ED. Morphine, Zofran given for nausea and abdominal pain. I consulted [facility], Dr. Marily Memos, who will admit the patient to the stepdown unit for further evaluation and treatment. Patient discussed with Dr. Vanita Panda who agrees with plan. Patient vitals stable  throughout ED course.  Final Clinical Impressions(s) / ED Diagnoses   Final diagnoses:  Type 2 diabetes mellitus with ketoacidosis without coma, without long-term current use of insulin [facility])    New Prescriptions New Prescriptions   No medications on file     Frederica Kuster, Hershal Coria 07/24/16 [place], MD 07/25/16 [phone]

## 2016-07-24 NOTE — ED Triage Notes (Addendum)
Pt arrives EMS with initial c/o N/V over last 2 days but c/o chest pain while in route. CBG reads "HI" for EMS. HX of diabetes with known CBG in 22"s. Given ASA 324 po and zofran 4mg  iv by EMS.

## 2016-07-24 NOTE — ED Notes (Signed)
[name] [name]; Transporter, transporting pt to x-ray.  

## 2016-07-24 NOTE — ED Notes (Signed)
Pt's CBG 336.  Informed Millie, RN.

## 2016-07-24 NOTE — ED Notes (Signed)
Pharmacy contacted for medications

## 2016-07-24 NOTE — ED Notes (Signed)
Pt's CBG 228.  Informed Millie, RN.

## 2016-07-24 NOTE — ED Notes (Signed)
Patient transported to X-ray 

## 2016-07-24 NOTE — ED Notes (Signed)
CBG 352; MD and RN notified

## 2016-07-24 NOTE — ED Notes (Signed)
CBG is 180.

## 2016-07-25 DIAGNOSIS — E1165 Type 2 diabetes mellitus with hyperglycemia: Secondary | ICD-10-CM

## 2016-07-25 DIAGNOSIS — E118 Type 2 diabetes mellitus with unspecified complications: Secondary | ICD-10-CM

## 2016-07-25 DIAGNOSIS — Z794 Long term (current) use of insulin: Secondary | ICD-10-CM

## 2016-07-25 DIAGNOSIS — N179 Acute kidney failure, unspecified: Secondary | ICD-10-CM

## 2016-07-25 DIAGNOSIS — E038 Other specified hypothyroidism: Secondary | ICD-10-CM

## 2016-07-25 DIAGNOSIS — G43009 Migraine without aura, not intractable, without status migrainosus: Secondary | ICD-10-CM

## 2016-07-25 DIAGNOSIS — E1310 Other specified diabetes mellitus with ketoacidosis without coma: Principal | ICD-10-CM

## 2016-07-25 LAB — GLUCOSE, CAPILLARY
GLUCOSE-CAPILLARY: 180 mg/dL — AB (ref 65–99)
GLUCOSE-CAPILLARY: 180 mg/dL — AB (ref 65–99)
Glucose-Capillary: 167 mg/dL — ABNORMAL HIGH (ref 65–99)
Glucose-Capillary: 183 mg/dL — ABNORMAL HIGH (ref 65–99)
Glucose-Capillary: 122 mg/dL — ABNORMAL HIGH (ref 65–99)
Glucose-Capillary: 192 mg/dL — ABNORMAL HIGH (ref 65–99)
Glucose-Capillary: 176 mg/dL — ABNORMAL HIGH (ref 65–99)

## 2016-07-25 LAB — BASIC METABOLIC PANEL
ANION GAP: 5 (ref 5–15)
Anion gap: 7 (ref 5–15)
BUN: 11 mg/dL (ref 6–20)
BUN: 12 mg/dL (ref 6–20)
CALCIUM: 8.2 mg/dL — AB (ref 8.9–10.3)
CALCIUM: 8.3 mg/dL — AB (ref 8.9–10.3)
CO2: 20 mmol/L — ABNORMAL LOW (ref 22–32)
CO2: 23 mmol/L (ref 22–32)
CREATININE: 0.62 mg/dL (ref 0.44–1.00)
Chloride: 109 mmol/L (ref 101–111)
Chloride: 110 mmol/L (ref 101–111)
Creatinine, Ser: 0.64 mg/dL (ref 0.44–1.00)
GFR calc Af Amer: >60 mL/min (ref >60)
GLUCOSE: 169 mg/dL — AB (ref 65–99)
GLUCOSE: 147 mg/dL — AB (ref 65–99)
POTASSIUM: 2.9 mmol/L — AB (ref 3.5–5.1)
Potassium: 3.3 mmol/L — ABNORMAL LOW (ref 3.5–5.1)
SODIUM: 137 mmol/L (ref 135–145)
Sodium: 137 mmol/L (ref 135–145)

## 2016-07-25 LAB — CBC
HCT: 39.7 % (ref 36.0–46.0)
HEMOGLOBIN: 13.5 g/dL (ref 12.0–15.0)
MCH: 29.5 pg (ref 26.0–34.0)
MCHC: 34 g/dL (ref 30.0–36.0)
MCV: 86.7 fL (ref 78.0–100.0)
PLATELETS: 271 10*3/uL (ref 150–400)
RBC: 4.58 MIL/uL (ref 3.87–5.11)
RDW: 14.2 % (ref 11.5–15.5)
WBC: 14.7 10*3/uL — AB (ref 4.0–10.5)

## 2016-07-25 LAB — MAGNESIUM: MAGNESIUM: 1.8 mg/dL (ref 1.7–2.4)

## 2016-07-25 LAB — TSH: TSH: 31.821 u[IU]/mL — AB (ref 0.350–4.500)

## 2016-07-25 MED ORDER — SODIUM CHLORIDE 0.9 % IV SOLN
INTRAVENOUS | Status: DC
  Administered 2016-07-25 – 2016-07-26 (×3): via INTRAVENOUS

## 2016-07-25 MED ORDER — LIVING WELL WITH DIABETES BOOK
Freq: Once | Status: DC
  Filled 2016-07-25: qty 1

## 2016-07-25 MED ORDER — INSULIN ASPART 100 UNIT/ML ~~LOC~~ SOLN
4.0000 [IU] | Freq: Three times a day (TID) | SUBCUTANEOUS | Status: DC
  Administered 2016-07-26: 4 [IU] via SUBCUTANEOUS

## 2016-07-25 MED ORDER — INSULIN GLARGINE 100 UNIT/ML ~~LOC~~ SOLN
40.0000 [IU] | Freq: Every day | SUBCUTANEOUS | Status: DC
  Administered 2016-07-25 (×2): 40 [IU] via SUBCUTANEOUS
  Filled 2016-07-25 (×4): qty 0.4

## 2016-07-25 MED ORDER — ONDANSETRON HCL 4 MG/2ML IJ SOLN
4.0000 mg | Freq: Four times a day (QID) | INTRAMUSCULAR | Status: DC | PRN
  Administered 2016-07-25 (×2): 4 mg via INTRAVENOUS
  Filled 2016-07-25 (×2): qty 2

## 2016-07-25 MED ORDER — INSULIN ASPART 100 UNIT/ML ~~LOC~~ SOLN: 0.0000 [IU] | Freq: Every day | SUBCUTANEOUS | Status: DC

## 2016-07-25 MED ORDER — INSULIN ASPART 100 UNIT/ML ~~LOC~~ SOLN
0.0000 [IU] | Freq: Three times a day (TID) | SUBCUTANEOUS | Status: DC
  Administered 2016-07-25 (×3): 3 [IU] via SUBCUTANEOUS
  Administered 2016-07-26: 2 [IU] via SUBCUTANEOUS
  Administered 2016-07-26: 3 [IU] via SUBCUTANEOUS

## 2016-07-25 MED ORDER — POTASSIUM CHLORIDE CRYS ER 20 MEQ PO TBCR
30.0000 meq | EXTENDED_RELEASE_TABLET | ORAL | Status: AC
  Administered 2016-07-25 (×2): 30 meq via ORAL
  Filled 2016-07-25 (×2): qty 1

## 2016-07-25 NOTE — Progress Notes (Signed)
Charting in error, correct date 07/24/16

## 2016-07-25 NOTE — Progress Notes (Signed)
PROGRESS NOTE    [name] [name]  [id number] DOB: Apr 30, 1961 DOA: 07/24/2016 PCP: Eliezer Lofts, MD   Brief Narrative:  55 y.o. WF PMHx Anxiety, B12 deficiency, GERD, HLD, HTN, Hypothyroidism, IBS, CKD stage II, DM Type 2 uncontrolled with, complications.  Presenting with profound hyperglycemia with 2 day history of malaise, nausea or vomiting. Patient developed right-sided chest pain on 07/24/2016 after violent emesis episode. This is typically nonexistent when patient is not moving but returns with deep respirations or outpatient of chest wall or sleeping on right side. The patient states that she cannot involve her diabetic medication 6 months ago she felt that she was doing well from a health standpoint no longer needed those medicines. She did not check her blood sugars until approximately 2 weeks ago when she was seen by her primary care physician and told that her A1c was 14.1. At that time she would check her blood sugars regularly which run in the 200-500 range. Patient was restarted at time of PCPs visit on her insulin, Victoza and metformin. Patient endorses compliance with this new regimen of Lantus 40 units daily at bedtime and 12-16 units 3 times a day before meals of NovoLog. Patient states that over the last 2 days she has developed worsening malaise nausea and emesis. Home sugars have remained 500 600 or above during this time. Also endorses polyuria but denies dysuria, back pain, fevers, shortness of breath, palpitations, neck stiffness, fevers, productive cough, lower external edema.    Subjective: 8/8 hemoglobin A1c= 14.5   Assessment & Plan:   Active Problems:   Hypothyroidism   Uncontrolled type 2 diabetes mellitus with complication ([name])   Hyperlipidemia   GERD   Leucocytosis   DKA, type 2 ([name])   Hyponatremia   AKI (acute kidney injury) ([place])   DM Type 2 uncontrolled with, complications/DKA -Stopped taking diabetic medicines 6 months ago, restarted 2 weeks ago  when A1c was noted to be 14 at PCPs office. Sugars have remained high since restarting medicines. Today VBG showing pH 7.28, PCO2 29.6, PO2 78, bicarbonate 14, glucose 949, anion gap 20+, troponin negative, WBC 15. Start iron glucose stabilizer in ED with rapid improvement in glucose area currently 336. -8/8 Hemoglobin A1c= 14.5 -Consulted  DM Educator -Consulted  DM Nutrition    AKI: (Baseline Cr 0.65) Lab Results  Component Value Date   CREATININE 0.62 07/25/2016   CREATININE 0.64 07/25/2016   CREATININE 0.62 07/24/2016  -Back to Baseline  Chest wall pain: - likely MSK/pulled muscle from emesis. Reproducible on palpation and w/ movement. Improving w/ rest. ASA earlier w/o improvement.  -Trop x 2 neg. EKG w/o ACS. - Voltaren gel  Psdudohyponatremia:  -128 on admission w/ NA 949 = corrected Na of 142.  - Resolved  Hypokalemia -Potassium IV 40 mEq  Hypothyroidism - Synthroid 212.5 mcq -TSH pending  HLD: -Lipitor 80 mg daily - Lipid panel pending  Migraine HA: - continue Relpax PRN migraine   DVT prophylaxis: Lovenox Code Status: Full Family Communication: Husband present at bedside Disposition Plan: Resolution of DKA   Consultants:  DM Nutrition pending DM Coordinator pending   Procedures/Significant Events:  None  Cultures None  Antimicrobials: None   Devices None   LINES / TUBES:  None    Continuous Infusions: . sodium chloride 75 mL/hr at 07/25/16 0659  . insulin (NOVOLIN-R) infusion Stopped (07/25/16 0835)     Objective: Vitals:   07/24/16 2020 07/25/16 0612 07/25/16 0736 07/25/16 1112  BP: 114/71 (!) 88/62  133/82 131/83  Pulse: 85 80 75 79  Resp: 16 18 12 17   Temp: 97.8 F (36.6 C) 98.7 F (37.1 C) 97.9 F (36.6 C) 98.2 F (36.8 C)  TempSrc: Oral Oral Oral Oral  SpO2: 97% 96% 98% 98%  Weight: 63.4 kg (139 lb 12.4 oz)     Height: 4\' 11"  (1.499 m)       Intake/Output Summary (Last 24 hours) at 07/25/16 1253 Last data  filed at 07/25/16 [id number]  Gross per 24 hour  Intake           391.25 ml  Output              275 ml  Net           116.25 ml   Filed Weights   07/24/16 1117 07/24/16 2020  Weight: 68.9 kg (152 lb) 63.4 kg (139 lb 12.4 oz)    Examination:  General: A/O 4, positive migraine, No acute respiratory distress Eyes: negative scleral hemorrhage, negative anisocoria, negative icterus, positive photophobia ENT: Negative Runny nose, negative gingival bleeding, Neck:  Negative scars, masses, torticollis, lymphadenopathy, JVD Lungs: Clear to auscultation bilaterally without wheezes or crackles Cardiovascular: Regular rate and rhythm without murmur gallop or rub normal S1 and S2 Abdomen: Positive abdominal pain RLQ/LLQ palpation, nondistended, positive soft, bowel sounds, no rebound, no ascites, no appreciable mass Extremities: No significant cyanosis, clubbing, or edema bilateral lower extremities Skin: Negative rashes, lesions, ulcers Psychiatric:  Negative depression, negative anxiety, negative fatigue, negative mania  Central nervous system:  Cranial nerves II through XII intact, tongue/uvula midline, all extremities muscle strength 5/5, sensation intact throughout, negative dysarthria, negative expressive aphasia, negative receptive aphasia.  .     Data Reviewed: Care during the described time interval was provided by me .  I have reviewed this patient's available data, including medical history, events of note, physical examination, and all test results as part of my evaluation. I have personally reviewed and interpreted all radiology studies.  CBC:  Recent Labs Lab 07/24/16 1145 07/25/16 0351  WBC 15.0* 14.7*  NEUTROABS 13.1*  --   HGB 16.8* 13.5  HCT 51.1* 39.7  MCV 90.6 86.7  PLT 317 [id number]   Basic Metabolic Panel:  Recent Labs Lab 07/24/16 1145 07/24/16 1708 07/24/16 2116 07/25/16 0018 07/25/16 0756  NA 128* 137 138 137 137  K 4.1 3.2* 2.9* 2.9* 3.3*  CL 95* 108 107 109  110  CO2 13* 19* 22 23 20*  GLUCOSE 949* 261* 130* 147* 169*  BUN 13 9 11 11 12   CREATININE 1.23* 0.68 0.62 0.64 0.62  CALCIUM 9.5 8.6* 8.6* 8.3* 8.2*  MG  --   --   --   --  1.8   GFR: Estimated Creatinine Clearance: 65.9 mL/min (by C-G formula based on SCr of 0.8 mg/dL). Liver Function Tests:  Recent Labs Lab 07/24/16 1145  AST 30  ALT 20  ALKPHOS 80  BILITOT 0.6  PROT 7.9  ALBUMIN 4.5    Recent Labs Lab 07/24/16 1145  LIPASE 38   No results for input(s): AMMONIA in the last 168 hours. Coagulation Profile: No results for input(s): INR, PROTIME in the last 168 hours. Cardiac Enzymes: No results for input(s): CKTOTAL, CKMB, CKMBINDEX, TROPONINI in the last 168 hours. BNP (last 3 results) No results for input(s): PROBNP in the last 8760 hours. HbA1C: No results for input(s): HGBA1C in the last 72 hours. CBG:  Recent Labs Lab 07/25/16 0108 07/25/16 0155 07/25/16 0314 07/25/16  [id number] 07/25/16 1111  GLUCAP 167* 183* 122* 180* 180*   Lipid Profile: No results for input(s): CHOL, HDL, LDLCALC, TRIG, CHOLHDL, LDLDIRECT in the last 72 hours. Thyroid Function Tests: No results for input(s): TSH, T4TOTAL, FREET4, T3FREE, THYROIDAB in the last 72 hours. Anemia Panel: No results for input(s): VITAMINB12, FOLATE, FERRITIN, TIBC, IRON, RETICCTPCT in the last 72 hours. Urine analysis:    Component Value Date/Time   COLORURINE YELLOW 07/24/2016 [place] 07/24/2016 1205   LABSPEC 1.038 (H) 07/24/2016 1205   PHURINE 5.0 07/24/2016 1205   GLUCOSEU >1000 (A) 07/24/2016 1205   HGBUR SMALL (A) 07/24/2016 1205   BILIRUBINUR NEGATIVE 07/24/2016 1205   BILIRUBINUR Neg. 01/04/2016 1025   KETONESUR 15 (A) 07/24/2016 1205   PROTEINUR 30 (A) 07/24/2016 1205   UROBILINOGEN 0.2 01/04/2016 1025   UROBILINOGEN 0.2 10/28/2013 1723   NITRITE NEGATIVE 07/24/2016 1205   LEUKOCYTESUR NEGATIVE 07/24/2016 1205   Sepsis  Labs: @LABRCNTIP (procalcitonin:4,lacticidven:4)  ) Recent Results (from the past 240 hour(s))  MRSA PCR Screening     Status: None   Collection Time: 07/24/16  8:58 PM  Result Value Ref Range Status   MRSA by PCR NEGATIVE NEGATIVE Final    Comment:        The GeneXpert MRSA Assay (FDA approved for NASAL specimens only), is one component of a comprehensive MRSA colonization surveillance program. It is not intended to diagnose MRSA infection nor to guide or monitor treatment for MRSA infections.          Radiology Studies: Dg Chest 2 View  Result Date: 07/24/2016 CLINICAL DATA:  Right-sided chest pain with nausea today. Hyperglycemia. Former smoker. EXAM: CHEST  2 VIEW COMPARISON:  01/27/2015 FINDINGS: The cardiomediastinal silhouette is within normal limits. The lungs are well inflated and clear. There is no evidence of pleural effusion or pneumothorax. No acute osseous abnormality is identified. IMPRESSION: No active cardiopulmonary disease. Electronically Signed   By: Logan Bores M.D.   On: 07/24/2016 12:15        Scheduled Meds: . atorvastatin  80 mg Oral q1800  . enoxaparin (LOVENOX) injection  40 mg Subcutaneous Q24H  . insulin aspart  0-15 Units Subcutaneous TID WC  . insulin aspart  0-5 Units Subcutaneous QHS  . insulin aspart  4 Units Subcutaneous TID WC  . insulin glargine  40 Units Subcutaneous QHS  . levothyroxine  212.5 mcg Oral QAC breakfast  . loratadine  10 mg Oral Daily  . sodium chloride flush  3 mL Intravenous Q12H   Continuous Infusions: . sodium chloride 75 mL/hr at 07/25/16 0659  . insulin (NOVOLIN-R) infusion Stopped (07/25/16 0835)     LOS: 1 day    Time spent: 40 minutes    Hulet Ehrmann, Geraldo Docker, MD [facility] Pager [phone]   If 7PM-7AM, please contact night-coverage [email or web address] Password [facility] 07/25/2016, 12:53 PM

## 2016-07-25 NOTE — Plan of Care (Signed)
Problem: Safety: Goal: Ability to remain free from injury will improve Outcome: Progressing Instructed to call for assistance before getting OOB.  Problem: Tissue Perfusion: Goal: Risk factors for ineffective tissue perfusion will decrease Outcome: Progressing On SQ Lovenox.  Problem: Activity: Goal: Risk for activity intolerance will decrease Outcome: Progressing Ambulating to the bathroom with one assist.   Problem: Nutrition: Goal: Adequate nutrition will be maintained Outcome: Not Progressing Very poor PO intake due to persistent nausea.

## 2016-07-25 NOTE — Progress Notes (Signed)
Patient is transferred from [facility] to [place]. Admission vital is stable. Pt is AOX4 and family is at the bedside

## 2016-07-25 NOTE — Progress Notes (Signed)
Inpatient Diabetes Program Recommendations  AACE/ADA: New Consensus Statement on Inpatient Glycemic Control (2015)  Target Ranges:  Prepandial:   less than 140 mg/dL      Peak postprandial:   less than 180 mg/dL (1-2 hours)      Critically ill patients:  140 - 180 mg/dL   Lab Results  Component Value Date   GLUCAP 180 (H) 07/25/2016   HGBA1C 14.5 07/17/2016    Review of Glycemic Control  Diabetes history: DM2 Outpatient Diabetes medications: Basaglar 40 units QHS Humalog 12-16 tidwc Current orders for Inpatient glycemic control: Lantus 40 units QHS, Novolog moderate tidwc and hs + 4 units tidwc HgbA1C indicates suboptimal glycemic control at home. Pt not eating at present.  Inpatient Diabetes Program Recommendations:    Agree with present orders.  Spoke with pt at length regarding her glycemic control. States her insurance recently stopped covering Lantus on her plan and endo prescribed Basaglar (same dose as Lantus) as a substitute. Insurance will Scientist, physiological and Humalog. Pt has meter and strips at home. Instructed pt to check blood sugars 3-4 times/day and record in logbook. F/U with endo within 1-2 weeks of discharge. Ordered Living Well with Diabetes book, diabetes videos on pt ed channel, and OP Diabetes Education consult for uncontrolled DM.  Will continue to follow. Thank you. Lorenda Peck, RD, LDN, CDE Inpatient Diabetes Coordinator [phone]

## 2016-07-25 NOTE — Care Management Note (Signed)
Case Management Note  Patient Details  Name: [name] [name] MRN: [id number] Date of Birth: [birth year]  Subjective/Objective:   Patient lives with spouse, pta indep.  Presents with DKA, was on insulin drip,now off, Patient has a endocrinologist, she has insurance that covers the insulin that she is on now.   She has a PCP and she has transportation at dc.  NCM will cont to follow for dc needs.          Action/Plan:   Expected Discharge Date:  07/26/16               Expected Discharge Plan:  Home/Self Care  In-House Referral:     Discharge planning Services  CM Consult  Post Acute Care Choice:    Choice offered to:     DME Arranged:    DME Agency:     HH Arranged:    HH Agency:     Status of Service:  In process, will continue to follow  If discussed at Long Length of Stay Meetings, dates discussed:    Additional Comments:  Zenon Mayo, RN 07/25/2016, 12:18 PM

## 2016-07-26 DIAGNOSIS — E871 Hypo-osmolality and hyponatremia: Secondary | ICD-10-CM

## 2016-07-26 DIAGNOSIS — E785 Hyperlipidemia, unspecified: Secondary | ICD-10-CM

## 2016-07-26 LAB — GLUCOSE, CAPILLARY
GLUCOSE-CAPILLARY: 104 mg/dL — AB (ref 65–99)
Glucose-Capillary: 124 mg/dL — ABNORMAL HIGH (ref 65–99)
Glucose-Capillary: 164 mg/dL — ABNORMAL HIGH (ref 65–99)

## 2016-07-26 MED ORDER — INSULIN ASPART 100 UNIT/ML FLEXPEN: 4.0000 [IU] | PEN_INJECTOR | Freq: Three times a day (TID) | SUBCUTANEOUS | 11 refills | Status: DC

## 2016-07-26 NOTE — Progress Notes (Signed)
[name] [name] to be D/C'd Home per MD order.  Discussed prescriptions and follow up appointments with the patient. Prescriptions given to patient, medication list explained in detail. Pt verbalized understanding.    Medication List    STOP taking these medications   canagliflozin 300 MG Tabs tablet Commonly known as:  INVOKANA   insulin lispro 100 UNIT/ML KiwkPen Commonly known as:  HUMALOG KWIKPEN   meloxicam 15 MG tablet Commonly known as:  MOBIC   metFORMIN 500 MG tablet Commonly known as:  GLUCOPHAGE     TAKE these medications   atorvastatin 80 MG tablet Commonly known as:  LIPITOR TAKE 1 TABLET (80 MG TOTAL) BY MOUTH DAILY.   BASAGLAR KWIKPEN 100 UNIT/ML Sopn Inject 40 units into the skin daily at 10 PM.   cetirizine 10 MG tablet Commonly known as:  ZYRTEC Take 1 tablet (10 mg total) by mouth daily.   eletriptan 40 MG tablet Commonly known as:  RELPAX One tablet by mouth at onset of headache. May repeat in 2 hours if headache persists or recurs.   fluticasone 50 MCG/ACT nasal spray Commonly known as:  FLONASE Place 2 sprays into both nostrils daily.   glucose blood test strip Use 3x a day - One Touch Verio Flex meter   insulin aspart 100 UNIT/ML FlexPen Commonly known as:  NOVOLOG FLEXPEN Inject 4 Units into the skin 3 (three) times daily with meals.   Insulin Pen Needle 32G X 4 MM Misc Commonly known as:  CAREFINE PEN NEEDLES Use 4x a day   levothyroxine 25 MCG tablet Commonly known as:  SYNTHROID, LEVOTHROID Take 0.5 tablets (12.5 mcg total) by mouth daily before breakfast. In addition to 200 mcg daily for TDD 212.5 mcg   levothyroxine 200 MCG tablet Commonly known as:  SYNTHROID, LEVOTHROID TAKE 1 TABLET BY MOUTH DAILY TAKE ALONG WITH THE 12.5 MG DOSE.   ondansetron 8 MG disintegrating tablet Commonly known as:  ZOFRAN-ODT Take 8 mg by mouth every 8 (eight) hours as needed for nausea or vomiting.   ONE TOUCH LANCETS Misc Use 3x a day - One Touch  Verio Flex meter   sodium chloride 0.65 % nasal spray Commonly known as:  OCEAN 2 sprays per nostril  3 times daily prn congestion       Vitals:   07/26/16 1240 07/26/16 1559  BP: 133/77 120/78  Pulse: 80 78  Resp: 18 17  Temp: 98 F (36.7 C) 98.1 F (36.7 C)    Skin clean, dry and intact without evidence of skin break down, no evidence of skin tears noted. IV catheter discontinued intact. Site without signs and symptoms of complications. Dressing and pressure applied. Pt denies pain at this time. No complaints noted.  An After Visit Summary was printed and given to the patient. Patient escorted via [place], and D/C home via private auto.  [name] 07/26/2016 6:15 PM

## 2016-07-26 NOTE — Plan of Care (Signed)
Problem: Food- and Nutrition-Related Knowledge Deficit (NB-1.1) Goal: Nutrition education Formal process to instruct or train a patient/client in a skill or to impart knowledge to help patients/clients voluntarily manage or modify food choices and eating behavior to maintain or improve health. Outcome: Completed/Met Date Met: 07/26/16  RD consulted for nutrition education regarding diabetes.   Lab Results  Component Value Date   HGBA1C 14.5 07/17/2016    RD provided "Carbohydrate Counting for People with Diabetes" handout from the Academy of Nutrition and Dietetics. Discussed different food groups and their effects on blood sugar, emphasizing carbohydrate-containing foods. Provided list of carbohydrates and recommended serving sizes of common foods.  Discussed importance of controlled and consistent carbohydrate intake throughout the day. Provided examples of ways to balance meals/snacks and encouraged intake of high-fiber, whole grain complex carbohydrates. Pt reports consuming large amounts of regular Mountain dew at home PTA. Discussed diabetic friendly drink options. Pt agreeable to switching to diet/sugar free beverages. Teach back method used.  Expect good compliance.  Body mass index is 28.67 kg/m. Pt meets criteria for overweight based on current BMI.  Current diet order is carbohydrate modified, patient is consuming approximately 90% of meals at this time. Labs and medications reviewed. No further nutrition interventions warranted at this time. RD contact information provided. If additional nutrition issues arise, please re-consult RD.   , MS, RD, LDN Pager # [phone] After hours/ weekend pager # [phone]     

## 2016-07-26 NOTE — Discharge Summary (Signed)
Physician Discharge Summary  [name] [name] [id number] DOB: 01/25/1961 DOA: 07/24/2016  PCP: [name] Lofts, MD  Admit date: 07/24/2016 Discharge date: 07/26/2016  Time spent: 35 minutes  Recommendations for Outpatient Follow-up:  DM Type 2 uncontrolled with, complications/DKA -Stopped taking diabetic medicines 6 months ago, restarted 2 weeks ago when A1c was noted to be 14 at PCPs office. Sugars  -8/8 Hemoglobin A1c= 14.5 -Consulted  DM Educator: Educated patient on absolute necessity of taking her medication -Consulted  DM Nutrition: Educated patient on diabetic nutrition   AKI: (Baseline Cr 0.65) Lab Results  Component Value Date   CREATININE 0.62 03-Aug-2016   CREATININE 0.64 Aug 03, 2016   CREATININE 0.62 07/24/2016  -Now within normal limit  Chest wall pain: -Resolved  Psdudohyponatremia:  - Resolved  Hypokalemia -PCP to monitor patient's electrolyte levels. Patient to seeDr. Eliezer [name] PCP in one week post discharge.  Hypothyroidism -Schedule follow-up with Dr.Cristina [name] Endocrinologist in 1-2 weeks hypothyroidism, noncompliance with medication -Patient admits to not taking prescribed Synthroid. Counseled patient on sequela of not taking medication as prescribed to include death. - Synthroid 212.5 mcq -08/04/23 TSH=31.8 -Schedule appointment for 1 week post discharge with Dr. Eliezer [name] DKA, significant hypothyroidism  HLD: -Lipitor 80 mg daily - Lipid panel pending  Migraine HA: - continue Relpax PRN migraine      Discharge Diagnoses:  Active Problems:   Hypothyroidism   Uncontrolled type 2 diabetes mellitus with complication ([name])   Hyperlipidemia   GERD   Leucocytosis   DKA, type 2 ([name])   Hyponatremia   AKI (acute kidney injury) [facility]   Discharge Condition: Stable  Diet recommendation: American diabetic Association  Filed Weights   07/24/16 1117 07/24/16 2020 Aug 03, 2016 1703  Weight: 68.9 kg (152 lb) 63.4 kg (139 lb 12.4 oz) 66.6 kg  (146 lb 12.8 oz)    History of present illness:  [name] PMHx Anxiety, B12 deficiency, GERD, HLD, HTN, Hypothyroidism, IBS, CKD stage II, DM Type 2 uncontrolled with, complications.  Presenting with profound hyperglycemia with 2 day history of malaise, nausea or vomiting. Patient developed right-sided chest pain on 07/24/2016 after violent emesis episode. This is typically nonexistent when patient is not moving but returns with deep respirations or outpatient of chest wall or sleeping on right side. The patient states that she cannot involve her diabetic medication 6 months ago she felt that she was doing well from a health standpoint no longer needed those medicines. She did not check her blood sugars until approximately 2 weeks ago when she was seen by her primary care physician and told that her A1c was 14.1. At that time she would check her blood sugars regularly which run in the 200-500 range. Patient was restarted at time of PCPs visit on her insulin, Victoza and metformin. Patient endorses compliance with this new regimen of Lantus 40 units daily at bedtime and 12-16 units 3 times a day before meals of NovoLog. Patient states that over the last 2 days she has developed worsening malaise nausea and emesis. Home sugars have remained 500 600 or above during this time. Also endorses polyuria but denies dysuria, back pain, fevers, shortness of breath, palpitations, neck stiffness, fevers, productive cough, lower external edema. Drains description patient was found to be noncompliant with her diabetic and thyroid medication. This resulted in patient patient being hospitalized for DKA. After hospitalization was found patient had a TSH of 31.8. Patient was restarted on insulin and Synthroid.   Consultants:  DM Nutrition pending DM Coordinator pending  Discharge Exam: Vitals:   07/25/16 1703 07/25/16 2135 07/26/16 0542 07/26/16 0815  BP: 134/86 132/82 129/78 126/83  Pulse: 85 78 82 83  Resp:  18 16 16 17   Temp: 98.3 F (36.8 C) 98.3 F (36.8 C) 98.3 F (36.8 C) 98.1 F (36.7 C)  TempSrc: Oral Oral Oral Oral  SpO2: 98% 97% 99% 99%  Weight: 66.6 kg (146 lb 12.8 oz)     Height:  5' (1.524 m)     General: A/O 4, positive migraine, No acute respiratory distress Eyes: negative scleral hemorrhage, negative anisocoria, negative icterus, positive photophobia ENT: Negative Runny nose, negative gingival bleeding, Neck:  Negative scars, masses, torticollis, lymphadenopathy, JVD Lungs: Clear to auscultation bilaterally without wheezes or crackles Cardiovascular: Regular rate and rhythm without murmur gallop or rub normal S1 and S2 Abdomen: Positive abdominal pain RLQ/LLQ palpation, nondistended, positive soft, bowel sounds, no rebound, no ascites, no appreciable mass   Discharge Instructions    Allergies  Allergen Reactions  . Codeine Hives, Anxiety and Other (See Comments)    REACTION: N \\T \ V and hyper. Pt can take Percocet.      The results of significant diagnostics from this hospitalization (including imaging, microbiology, ancillary and laboratory) are listed below for reference.    Significant Diagnostic Studies: Dg Chest 2 View  Result Date: 07/24/2016 CLINICAL DATA:  Right-sided chest pain with nausea today. Hyperglycemia. Former smoker. EXAM: CHEST  2 VIEW COMPARISON:  01/27/2015 FINDINGS: The cardiomediastinal silhouette is within normal limits. The lungs are well inflated and clear. There is no evidence of pleural effusion or pneumothorax. No acute osseous abnormality is identified. IMPRESSION: No active cardiopulmonary disease. Electronically Signed   By: Logan Bores M.D.   On: 07/24/2016 12:15    Microbiology: Recent Results (from the past 240 hour(s))  MRSA PCR Screening     Status: None   Collection Time: 07/24/16  8:58 PM  Result Value Ref Range Status   MRSA by PCR NEGATIVE NEGATIVE Final    Comment:        The GeneXpert MRSA Assay (FDA approved for  NASAL specimens only), is one component of a comprehensive MRSA colonization surveillance program. It is not intended to diagnose MRSA infection nor to guide or monitor treatment for MRSA infections.      Labs: Basic Metabolic Panel:  Recent Labs Lab 07/24/16 1145 07/24/16 1708 07/24/16 2116 07/25/16 0018 07/25/16 0756  NA 128* 137 138 137 137  K 4.1 3.2* 2.9* 2.9* 3.3*  CL 95* 108 107 109 110  CO2 13* 19* 22 23 20*  GLUCOSE 949* 261* 130* 147* 169*  BUN 13 9 11 11 12   CREATININE 1.23* 0.68 0.62 0.64 0.62  CALCIUM 9.5 8.6* 8.6* 8.3* 8.2*  MG  --   --   --   --  1.8   Liver Function Tests:  Recent Labs Lab 07/24/16 1145  AST 30  ALT 20  ALKPHOS 80  BILITOT 0.6  PROT 7.9  ALBUMIN 4.5    Recent Labs Lab 07/24/16 1145  LIPASE 38   No results for input(s): AMMONIA in the last 168 hours. CBC:  Recent Labs Lab 07/24/16 1145 07/25/16 0351  WBC 15.0* 14.7*  NEUTROABS 13.1*  --   HGB 16.8* 13.5  HCT 51.1* 39.7  MCV 90.6 86.7  PLT 317 271   Cardiac Enzymes: No results for input(s): CKTOTAL, CKMB, CKMBINDEX, TROPONINI in the last 168 hours. BNP: BNP (last 3 results) No results for input(s): BNP in the  last 8760 hours.  ProBNP (last 3 results) No results for input(s): PROBNP in the last 8760 hours.  CBG:  Recent Labs Lab 07/25/16 0735 07/25/16 1111 07/25/16 1705 07/25/16 2134 07/26/16 0814  GLUCAP 180* 180* 192* 176* 124*       Signed:  Dia Crawford, MD [facility] [phone] pager

## 2016-07-27 ENCOUNTER — Other Ambulatory Visit: Payer: Self-pay

## 2016-07-27 ENCOUNTER — Telehealth: Payer: Self-pay

## 2016-07-27 MED ORDER — ATORVASTATIN CALCIUM 80 MG PO TABS: 80.0000 mg | ORAL_TABLET | Freq: Every day | ORAL | 5 refills | Status: DC

## 2016-07-27 NOTE — Telephone Encounter (Signed)
During medication review, pt stated she needed refill for Atorvastation. Confirmed pharmacy is correct.

## 2016-07-27 NOTE — Telephone Encounter (Signed)
Transition Care Management Follow-up Telephone Call   Date discharged? 07/26/2016   How have you been since you were released from the hospital? Health status improving   Do you understand why you were in the hospital? Yes   Do you understand the discharge instructions? Yes   Where were you discharged to? Home   Items Reviewed:  Medications reviewed: Yes  Allergies reviewed: Yes  Dietary changes reviewed: N/A  Referrals reviewed: Yes   Functional Questionnaire: Pt is independent with all ADLs and iADLs  Activities of Daily Living (ADLs):   Personal hygiene  Dressing Eating  Maintaining continence Transferring  Independent Activities of Daily Living (ADLs): Basic communication skills Transportation Meal preparation  Shopping Housework  Managing medications Managing personal finances   Any transportation issues/concerns?: NO   Any patient concerns? NO   Confirmed importance and date/time of follow-up visits scheduled YES  Provider Appointment booked with Dr. Diona Browner on 08/02/2016 @ 1530  Confirmed with patient if condition begins to worsen call PCP or go to the ER.  Patient was given the office number and encouraged to call back with question or concerns.  : YES

## 2016-08-02 ENCOUNTER — Encounter: Payer: Self-pay | Admitting: Family Medicine

## 2016-08-02 ENCOUNTER — Ambulatory Visit (INDEPENDENT_AMBULATORY_CARE_PROVIDER_SITE_OTHER): Payer: 59 | Admitting: Family Medicine

## 2016-08-02 VITALS — BP 121/82 | HR 103 | Temp 98.2°F | Ht 61.0 in | Wt 146.0 lb

## 2016-08-02 DIAGNOSIS — E038 Other specified hypothyroidism: Secondary | ICD-10-CM

## 2016-08-02 DIAGNOSIS — E1310 Other specified diabetes mellitus with ketoacidosis without coma: Secondary | ICD-10-CM | POA: Diagnosis not present

## 2016-08-02 DIAGNOSIS — N182 Chronic kidney disease, stage 2 (mild): Secondary | ICD-10-CM

## 2016-08-02 DIAGNOSIS — N179 Acute kidney failure, unspecified: Secondary | ICD-10-CM | POA: Diagnosis not present

## 2016-08-02 DIAGNOSIS — Z23 Encounter for immunization: Secondary | ICD-10-CM

## 2016-08-02 DIAGNOSIS — E1110 Type 2 diabetes mellitus with ketoacidosis without coma: Secondary | ICD-10-CM

## 2016-08-02 DIAGNOSIS — Z5189 Encounter for other specified aftercare: Secondary | ICD-10-CM | POA: Diagnosis not present

## 2016-08-02 NOTE — Progress Notes (Signed)
Pre visit review using our clinic review tool, if applicable. No additional management support is needed unless otherwise documented below in the visit note. 

## 2016-08-02 NOTE — Patient Instructions (Addendum)
Start back on Basalgar 20 Units daily at bedtime. Likely will need to increase  Increase humalog  10 units with each meal.  Continue thyroid medication.  Work on low [employer].  Check blood sugars write them down and bring to see ENDO.  Drink lots of water.  Hold off on other diabetes meds until see ENDO next week.  Stop at lab on way out.  Keep appt with DM classes.

## 2016-08-02 NOTE — Progress Notes (Signed)
Subjective:    Patient ID: [name] [name], female    DOB: 1961-09-19, 55 y.o.   MRN: [id number]  HPI  55 year old female presents for follow up hospitalization for  Profound hyperglycemia  (949)causing malaise, N/V,chest wall pain, hypokalemia, hypothyroidism.  She was found to be in diabetic ketoacidosis.  TSH was 31 off medication.   6 months ago she stopped taking alle her medications. At follow up [facility].Marland Kitchen A1C found to be 14. Started back on her meds but changed lantus ( not covered) to [employer].  Admitted on 8/15 Discharged 8/17 VBG showing pH 7.28, PCO2 29.6, PO2 78, bicarbonate 14, glucose 949, anion gap 20+, troponin negative, WBC 15. Started iron glucose stabilizer in ED with rapid improvement in glucose area currently 336. - DM educator - 1 amp bicarb - Resumed home insulin and Novolog when gap closed x2 w/ education  provided regarding how to titrate dose when going home.  - Lactic acid  AKI: Cr 1.29. Baseline 0.65. Likely from dehydration in setting of DKA - IVF - BMET in am  Chest wall pain: likely MSK/pulled muscle from emesis. Reproducible on palpation and w/ movement. Improving w/ rest. ASA  w/o improvement. Trop neg. EKG w/o ACS. - Voltaren gel  Psdudohyponatremia: 128 on admission w/ NA 949 = corrected Na of 142.    Today she reports she has been taking her medications as directed except novolog is pending from pharmacy.  Hospitalist stopped invokana, metformin, basaglar ( per pt, no clear note in chart on why long acting insulin stopped)! Hospitalist stopped her Humalog but changed to novolog only. So instead she has been using Humalog using 4 units as ED instructed. This much lower then the dose ENDO had been on. FBS 386 yesterday and today, 2 hours after meals 490. She has been trying to drink more water, less soda.   She has follow up with ENDO in 4 days BP Readings from Last 3 Encounters:  08/02/16 121/82  07/26/16 120/78  07/17/16  120/80   She is still tired, urinating a lot.  Review of Systems  Constitutional: Positive for fatigue. Negative for fever.  HENT: Negative for ear pain.   Eyes: Negative for pain.  Respiratory: Negative for chest tightness and shortness of breath.   Cardiovascular: Negative for chest pain, palpitations and leg swelling.  Gastrointestinal: Positive for abdominal pain and nausea.  Genitourinary: Positive for frequency. Negative for dysuria.       Objective:   Physical Exam  Constitutional: Vital signs are normal. She appears well-developed and well-nourished. She is cooperative.  Non-toxic appearance. She does not appear ill. No distress.  obese  HENT:  Head: Normocephalic.  Right Ear: Hearing, tympanic membrane, external ear and ear canal normal. Tympanic membrane is not erythematous, not retracted and not bulging.  Left Ear: Hearing, tympanic membrane, external ear and ear canal normal. Tympanic membrane is not erythematous, not retracted and not bulging.  Nose: No mucosal edema or rhinorrhea. Right sinus exhibits no maxillary sinus tenderness and no frontal sinus tenderness. Left sinus exhibits no maxillary sinus tenderness and no frontal sinus tenderness.  Mouth/Throat: Uvula is midline, oropharynx is clear and moist and mucous membranes are normal.  Eyes: Conjunctivae, EOM and lids are normal. Pupils are equal, round, and reactive to light. Lids are everted and swept, no foreign bodies found.  Neck: Trachea normal and normal range of motion. Neck supple. Carotid bruit is not present. No thyroid mass and no thyromegaly present.  Cardiovascular: Normal rate, regular rhythm, S1 normal, S2 normal, normal heart sounds, intact distal pulses and normal pulses.  Exam reveals no gallop and no friction rub.   No murmur heard. Pulmonary/Chest: Effort normal and breath sounds normal. No tachypnea. No respiratory distress. She has no decreased breath sounds. She has no wheezes. She has no  rhonchi. She has no rales.  Abdominal: Soft. Normal appearance and bowel sounds are normal. There is no hepatosplenomegaly. There is generalized tenderness. There is no rigidity, no guarding and no CVA tenderness. No hernia.  Neurological: She is alert.  Skin: Skin is warm, dry and intact. No rash noted.  Psychiatric: Her speech is normal and behavior is normal. Judgment and thought content normal. Her mood appears not anxious. Cognition and memory are normal. She does not exhibit a depressed mood.          Assessment & Plan:

## 2016-08-02 NOTE — Assessment & Plan Note (Signed)
Re-eval electrolytes and kidney function. Push fluids.

## 2016-08-02 NOTE — Assessment & Plan Note (Signed)
Back on high dose thyroid medication. Adjustment per ENDO.

## 2016-08-02 NOTE — Assessment & Plan Note (Signed)
Pt reports CBGs increasing back up. Not clear why hospitalist only placed her on lo dose rapid acting mealtime insulin and stopped long acting  Insulin.  Agree with holding invokana and metfomrin given acute kidney injry. Check BMET today. Will restart basalgar (will start 1/2 dose as pt is wary of starting back on higher dose she was on previously). Will likely need to increase. No need to change to novolog  Given not covered by insurance.. Stay on humalog and increase to 10 units with each meal. Will likely need to increase per ENDO. Work on low [employer]. Reviewed in detail. Encouraged pt to keep appointment with diabetic nutrition counseling.

## 2016-08-03 LAB — BASIC METABOLIC PANEL
BUN: 12 mg/dL (ref 6–23)
CHLORIDE: 96 meq/L (ref 96–112)
CO2: 28 mEq/L (ref 19–32)
CREATININE: 0.76 mg/dL (ref 0.40–1.20)
Calcium: 9.8 mg/dL (ref 8.4–10.5)
GFR: 84.58 mL/min (ref >60.00)
GLUCOSE: 333 mg/dL — AB (ref 70–99)
POTASSIUM: 4.3 meq/L (ref 3.5–5.1)
Sodium: 134 mEq/L — ABNORMAL LOW (ref 135–145)

## 2016-08-07 ENCOUNTER — Ambulatory Visit (INDEPENDENT_AMBULATORY_CARE_PROVIDER_SITE_OTHER): Payer: 59 | Admitting: Internal Medicine

## 2016-08-07 ENCOUNTER — Encounter: Payer: Self-pay | Admitting: Internal Medicine

## 2016-08-07 VITALS — BP 100/74 | HR 96 | Ht 61.0 in | Wt 145.0 lb

## 2016-08-07 DIAGNOSIS — E039 Hypothyroidism, unspecified: Secondary | ICD-10-CM

## 2016-08-07 DIAGNOSIS — E1310 Other specified diabetes mellitus with ketoacidosis without coma: Secondary | ICD-10-CM

## 2016-08-07 DIAGNOSIS — Z794 Long term (current) use of insulin: Secondary | ICD-10-CM | POA: Diagnosis not present

## 2016-08-07 DIAGNOSIS — E1110 Type 2 diabetes mellitus with ketoacidosis without coma: Secondary | ICD-10-CM

## 2016-08-07 NOTE — Progress Notes (Signed)
Patient ID: [name], female   DOB: August 07, 1961, 55 y.o.   MRN: [id number]  HPI: [name] [name] is a 55 y.o.-year-old female, initially referred by her PCP, Dr.Bedsole, for management of DM2, dx in 2012, insulin-dependent since 2015, uncontrolled, without complications and also hypothyroidism. She previously saw Dr Howell Rucks, who since left the practice.   At last visit, <1 mo ago, we added back her insulins. She was not checking sugars at all.  Since last visit >> admitted 07/24/2016 for DKA after she developed HA and nausea. CGB >900. She was taken off Metformin and Invokana.  DM2: Last hemoglobin A1c was: Lab Results  Component Value Date   HGBA1C 14.5 07/17/2016   HGBA1C 14.7 (H) 09/06/2015   HGBA1C 12.0 (H) 04/26/2015   Pt was on a regimen of: - VGo 40 with 6 clicks per meal - her granddaughter rips it off. She lately did not have it attached because of this. Insurance not covering it completely.  - Invokana 300 mg daily in am  Now on: - Basaglar 40 >> 20 units at bedtime. - Humalog 3x a day - 10 units before meals   She tried (per review of Dr. Boyd Kerbs note): - Tried Metformin and states that "it wasn't working". Probably affected her IBS symptoms as well, but doesn't recall specifically.  - Then tried glipizide and that " didn't work either".  - Has been on insulin since 6 + months ~2015. - Started on lantus, but having problems with compliance as having to force herself to take the shot. Doesn't like injectable therapy. Trying to get her husband to give her the injections.  - Has been on Novolog before but doesn't like the shots.  - Switched from Lantus to [place] with Humalog Jan 2016  Pt checks her sugars 2-4x a day - am: n/c >> 335-371 - 2h after b'fast: n/c >> 368, 540 - before lunch: n/c >> 327-427 - 2h after lunch: n/c >> 473 - before dinner: n/c >> 274-497 - 2h after dinner: n/c - bedtime: n/c >> 268-402, 514 - nighttime: n/c  Glucometer: OneTouch Verio Flex  meter  Pt's meals are: - Breakfast: toast - busy with the grandbaby - Lunch: 1/2 sandwich - Dinner: meat + veggies, baked potato.  - Snacks: chips, Mtn Dew  - no CKD, last BUN/creatinine:  Lab Results  Component Value Date   BUN 12 08/02/2016   CREATININE 0.76 08/02/2016   - last set of lipids: Lab Results  Component Value Date   CHOL 213 (H) 01/04/2015   HDL 44.20 01/04/2015   LDLCALC 129 (H) 01/04/2015   LDLDIRECT 206.0 07/27/2014   TRIG 199.0 (H) 01/04/2015   CHOLHDL 5 01/04/2015   - last eye exam was: Abstract on 03/08/2016  Component Date Value Ref Range Status  . HM Diabetic Eye Exam 02/21/2016 No Retinopathy  No Retinopathy Final   - no numbness and tingling in her feet.  Hypothyroidism: - dx 2008 - on LT4 200 >> 212.5 mcg daily - takes LT4: Fasting In am With water Eats >60 min later No Iron, Calcium, MVI, PPI  Her TSH levels are very fluctuating due to poor compliance with her levothyroxine:  Lab Results  Component Value Date   TSH 31.821 (H) 07/25/2016   TSH 59.96 (H) 09/06/2015   TSH 31.59 (H) 04/26/2015   TSH 0.155 (L) 01/27/2015   TSH 13.32 (H) 01/04/2015   TSH 23.39 (H) 10/13/2013   TSH 11.19 (H) 10/06/2013   TSH 0.015 (  L) 09/18/2013   TSH 9.09 (H) 06/23/2013   TSH 8.43 (H) 05/13/2012   FREET4 0.36 (L) 09/06/2015   FREET4 0.26 (L) 04/26/2015   FREET4 2.30 (H) 01/27/2015   FREET4 0.57 (L) 01/04/2015   FREET4 0.56 (L) 07/27/2014   FREET4 0.56 (L) 01/08/2014   FREET4 2.28 (H) 10/29/2013   FREET4 0.65 08/26/2012   FREET4 0.63 05/20/2012   FREET4 0.63 11/30/2011   ROS: Constitutional: no weight gain/loss, no fatigue, no subjective hyperthermia/hypothermia Eyes: no blurry vision, no xerophthalmia ENT: no sore throat, no nodules palpated in throat, no dysphagia/odynophagia, no hoarseness Cardiovascular: no CP/SOB/palpitations/leg swelling Respiratory: no cough/SOB Gastrointestinal: no N/V/D/C/+ heartburn Musculoskeletal: no muscle/joint  aches Skin: no rashes Neurological: no tremors/numbness/tingling/dizziness, + HA  I reviewed pt's medications, allergies, PMH, social hx, family hx, and changes were documented in the history of present illness. Otherwise, unchanged from my initial visit note.  Past Medical History:  Diagnosis Date  . Anxiety   . B12 deficiency   . GERD (gastroesophageal reflux disease)   . Hyperlipidemia   . Hypertension   . Hypothyroidism   . IBS (irritable bowel syndrome)   . Intermittent vertigo   . 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 Leg cramping    "@ night" (09/18/2013)  . Migraine    "once q couple months" (09/18/2013)  . Osteoporosis   . Peripheral neuropathy ([place])   . Type II diabetes mellitus ([place])   . Umbilical hernia    unrepaired (09/18/2013)   Past Surgical History:  Procedure Laterality Date  . CESAREAN SECTION  1989  . LAPAROSCOPIC ASSISTED VAGINAL HYSTERECTOMY  09/23/2012   Procedure: LAPAROSCOPIC ASSISTED VAGINAL HYSTERECTOMY;  Surgeon: Linda Hedges, DO;  Location: [facility];  Service: Gynecology;  Laterality: N/A;  pull Dr Gregor Hams instrument  . TUBAL LIGATION Bilateral 1992  . VAGINAL HYSTERECTOMY  2013   Social History   Social History  . Marital status: Married    Spouse name: N/A  . Number of children: 2  . Years of education: N/A   Occupational History  . UNEMPLOYED Unemployed    homemaker   Social History Main Topics  . Smoking status: Former Smoker    Years: 0.20    Types: Cigarettes    Quit date: 12/10/1990  . Smokeless tobacco: Never Used  . Alcohol use No  . Drug use: No  . Sexual activity: Yes   Other Topics Concern  . Not on file   Social History Narrative   Regular exercise- no    Diet- lots of mountain dew, limits fast foods    Current Outpatient Prescriptions on File Prior to Visit  Medication Sig Dispense Refill  . atorvastatin (LIPITOR) 80 MG tablet Take 1 tablet (80 mg total) by mouth daily at 6 PM. 30  tablet 5  . cetirizine (ZYRTEC) 10 MG tablet Take 1 tablet (10 mg total) by mouth daily. 30 tablet 11  . eletriptan (RELPAX) 40 MG tablet One tablet by mouth at onset of headache. May repeat in 2 hours if headache persists or recurs. 10 tablet 0  . fluticasone (FLONASE) 50 MCG/ACT nasal spray Place 2 sprays into both nostrils daily. 16 g 6  . glucose blood test strip Use 3x a day - One Touch Verio Flex meter 200 each 3  . Insulin Glargine (BASAGLAR KWIKPEN) 100 UNIT/ML SOPN Inject 20 Units into the skin at bedtime.    . insulin lispro (HUMALOG) 100 UNIT/ML injection Inject 10 Units into the  skin 3 (three) times daily before meals.    . Insulin Pen Needle (CAREFINE PEN NEEDLES) 32G X 4 MM MISC Use 4x a day 200 each 11  . levothyroxine (SYNTHROID, LEVOTHROID) 200 MCG tablet TAKE 1 TABLET BY MOUTH DAILY TAKE ALONG WITH THE 12.5 MG DOSE. 60 tablet 1  . levothyroxine (SYNTHROID, LEVOTHROID) 25 MCG tablet Take 0.5 tablets (12.5 mcg total) by mouth daily before breakfast. In addition to 200 mcg daily for TDD 212.5 mcg 30 tablet 2  . ondansetron (ZOFRAN-ODT) 8 MG disintegrating tablet Take 8 mg by mouth every 8 (eight) hours as needed for nausea or vomiting.    . ONE TOUCH LANCETS MISC Use 3x a day - One Touch Verio Flex meter 200 each 3  . sodium chloride (OCEAN) 0.65 % nasal spray 2 sprays per nostril  3 times daily prn congestion 30 mL 0   No current facility-administered medications on file prior to visit.    Allergies  Allergen Reactions  . Codeine Hives, Anxiety and Other (See Comments)    REACTION: N \\T \ V and hyper. Pt can take Percocet.   Family History  Problem Relation Age of Onset  . Diabetes Other   . Heart disease Other   . Heart failure Mother   . Diabetes Maternal Grandmother   . Alzheimer's disease Maternal Grandmother   . Coronary artery disease Maternal Grandmother   . Heart attack Maternal Grandfather   . Heart failure Brother    PE: BP 100/74 (BP Location: Left Arm,  Patient Position: Sitting)   Pulse 96   Ht 5\' 1"  (1.549 m)   Wt 145 lb (65.8 kg)   LMP 08/11/2012   SpO2 96%   BMI 27.40 kg/m  Body mass index is 27.4 kg/m. Wt Readings from Last 3 Encounters:  08/07/16 145 lb (65.8 kg)  08/02/16 146 lb (66.2 kg)  07/25/16 146 lb 12.8 oz (66.6 kg)   Constitutional: overweight, in NAD Eyes: PERRLA, EOMI, no exophthalmos ENT: moist mucous membranes, no thyromegaly, no cervical lymphadenopathy Cardiovascular: RRR, No MRG Respiratory: CTA B Gastrointestinal: abdomen soft, NT, ND, BS+ Musculoskeletal: no deformities, strength intact in all 4 Skin: moist, warm, no rashes Neurological: no tremor with outstretched hands, DTR normal in all 4  ASSESSMENT: 1. DM2, insulin-dependent, uncontrolled, with DKA - Noncompliant with CBG checks and meds  2. Hypothyroidism -  uncontrolled   PLAN:  1. Patient with long-standing, uncontrolled diabetes, with med noncompliance and a recent hospitalization for DKA. At last visit, we started her back on insulins and advised her to start checking sugars (given new meter). - she was taken off Invokana and metformin at discharge, which we will continue. She needs to be tested for DM1, but I cannot check a C peptide as her sugars are too high. Will check at next visit. Until then, will continue with only insulin. We will need to increase her doses, though. - I suggested to:  Patient Instructions  Please increase: - Basaglar to 30 units at bedtime.  In 3 days, if sugars in am not <150 >> increase to 35. In 3 days, if sugars in am not <150 >> increase to 40. - Humalog 3x a day to: - 12 units before a smaller meal  - 16 units before a larger meal  Continue to stay off Invokana and Metformin.  - start checking sugars at different times of the day - check 3 times a day, rotating checks - advised for yearly eye exams >> she is  UTD - will need a Lipid panel check at next visit - Return to clinic in 1 mo with sugar  log    2. Hypothyroidism - This is uncontrolled due to noncompliance with her levothyroxine. Will continue Levothyroxine: 212 mcg daily. - again advised her to take the thyroid hormone every day, with water, >30 minutes before breakfast, separated by >4 hours from acid reflux medications, calcium, iron, multivitamins. - We'll need to recheck her thyroid tests at next visit   Philemon Kingdom, MD PhD [facility]

## 2016-08-07 NOTE — Patient Instructions (Addendum)
Please increase: - Basaglar to 30 units at bedtime.  In 3 days, if sugars in am not <150 >> increase to 35. In 3 days, if sugars in am not <150 >> increase to 40. - Humalog 3x a day to: - 12 units before a smaller meal  - 16 units before a larger meal  Continue to stay off Invokana and Metformin.

## 2016-08-20 ENCOUNTER — Other Ambulatory Visit: Payer: Self-pay | Admitting: Internal Medicine

## 2016-08-23 ENCOUNTER — Encounter: Payer: Self-pay | Admitting: Dietician

## 2016-08-23 ENCOUNTER — Telehealth: Payer: Self-pay | Admitting: Dietician

## 2016-08-23 ENCOUNTER — Encounter: Payer: 59 | Attending: Internal Medicine | Admitting: Dietician

## 2016-08-23 DIAGNOSIS — Z6827 Body mass index (BMI) 27.0-27.9, adult: Secondary | ICD-10-CM | POA: Insufficient documentation

## 2016-08-23 DIAGNOSIS — R634 Abnormal weight loss: Secondary | ICD-10-CM | POA: Insufficient documentation

## 2016-08-23 DIAGNOSIS — I129 Hypertensive chronic kidney disease with stage 1 through stage 4 chronic kidney disease, or unspecified chronic kidney disease: Secondary | ICD-10-CM | POA: Diagnosis not present

## 2016-08-23 DIAGNOSIS — Z713 Dietary counseling and surveillance: Secondary | ICD-10-CM | POA: Diagnosis present

## 2016-08-23 DIAGNOSIS — M810 Age-related osteoporosis without current pathological fracture: Secondary | ICD-10-CM | POA: Diagnosis not present

## 2016-08-23 DIAGNOSIS — K219 Gastro-esophageal reflux disease without esophagitis: Secondary | ICD-10-CM | POA: Diagnosis not present

## 2016-08-23 DIAGNOSIS — E785 Hyperlipidemia, unspecified: Secondary | ICD-10-CM | POA: Insufficient documentation

## 2016-08-23 DIAGNOSIS — N189 Chronic kidney disease, unspecified: Secondary | ICD-10-CM | POA: Diagnosis not present

## 2016-08-23 DIAGNOSIS — E039 Hypothyroidism, unspecified: Secondary | ICD-10-CM | POA: Insufficient documentation

## 2016-08-23 DIAGNOSIS — K589 Irritable bowel syndrome without diarrhea: Secondary | ICD-10-CM | POA: Diagnosis not present

## 2016-08-23 DIAGNOSIS — E538 Deficiency of other specified B group vitamins: Secondary | ICD-10-CM | POA: Insufficient documentation

## 2016-08-23 DIAGNOSIS — Z794 Long term (current) use of insulin: Secondary | ICD-10-CM | POA: Diagnosis not present

## 2016-08-23 DIAGNOSIS — E1122 Type 2 diabetes mellitus with diabetic chronic kidney disease: Secondary | ICD-10-CM | POA: Diagnosis not present

## 2016-08-23 DIAGNOSIS — E1110 Type 2 diabetes mellitus with ketoacidosis without coma: Secondary | ICD-10-CM

## 2016-08-23 NOTE — Patient Instructions (Signed)
Rethink what you drink.  Choose beverages without carbohydrate. Avoid skipping meals. Remember to take your medicine.  Aim for 3 Carb Choices per meal (45 grams) +/- 1 either way  Aim for 0-1 Carbs per snack if hungry  Include protein in moderation with your meals and snacks Consider reading food labels for Total Carbohydrate and Fat Grams of foods

## 2016-08-24 NOTE — Progress Notes (Signed)
Diabetes Self-Management Education  Visit Type: First/Initial  Appt. Start Time: 0815 Appt. End Time: 0945  08/24/2016  [name], identified by name and date of birth, is a 55 y.o. female with a diagnosis of Diabetes: Type 2 (? type 1).   Other hx includes GERD, hyperlipidemia, HTN, CKD, IBS , hypothyroidism, and vitamin B-12 deficiency and osteoporosis.  Her last AeC sas 14.5% 07/17/16.  She forgets to take her insulin at times and skips meals at times. She is currently taking Basaglar 40 units each HS and Humalog 12-16 units with meals.  She was 156 lbs 1 month ago and this was her highest adult weight.  She was 143 lbs today.  Weight loss probably due to uncontrolled diabetes and meal skipping.  Patient lives with her brother and son.  She has a sister in law in a Nursing home, cares for her 64 yo grandchild several days, and her father in law was just diagnosed with cancer.  ASSESSMENT  Height 5' (1.524 m), weight 143 lb (64.9 kg), last menstrual period 08/11/2012. Body mass index is 27.93 kg/m.      Diabetes Self-Management Education - 08/23/16 0832      Visit Information   Visit Type First/Initial     Initial Visit   Diabetes Type Type 2  ? type 1   Are you currently following a meal plan? No   Are you taking your medications as prescribed? Yes  She states that she has improved with this.  Her husband reminds her to take her insulin.   Date Diagnosed 2012  Insulin since 2015.     Health Coping   How would you rate your overall health? Fair     Psychosocial Assessment   Patient Belief/Attitude about Diabetes Other (comment)  overwhelmed   Self-care barriers None   Self-management support Doctor's office;Family   Other persons present Patient   Patient Concerns Nutrition/Meal planning;Glycemic Control   Special Needs None   Preferred Learning Style No preference indicated   Learning Readiness Ready   How often do you need to have someone help you when you read  instructions, pamphlets, or other written materials from your doctor or pharmacy? 1 - Never   What is the last grade level you completed in school? 12th grade     Pre-Education Assessment   Patient understands the diabetes disease and treatment process. Demonstrates understanding / competency   Patient understands incorporating nutritional management into lifestyle. Needs Review   Patient undertands incorporating physical activity into lifestyle. Needs Review   Patient understands using medications safely. Demonstrates understanding / competency   Patient understands monitoring blood glucose, interpreting and using results Needs Review   Patient understands prevention, detection, and treatment of acute complications. Demonstrates understanding / competency   Patient understands prevention, detection, and treatment of chronic complications. Demonstrates understanding / competency   Patient understands how to develop strategies to address psychosocial issues. Needs Review   Patient understands how to develop strategies to promote health/change behavior. Needs Review     Complications   Last HgB A1C per patient/outside source 14.5 %  07/17/16   How often do you check your blood sugar? 3-4 times/day   Fasting Blood glucose range (mg/dL) >200  320-400   Postprandial Blood glucose range (mg/dL) >200   Number of hypoglycemic episodes per month 0   Number of hyperglycemic episodes per week 28   Can you tell when your blood sugar is high? Yes   What do you do  if your blood sugar is high? nothing   Have you had a dilated eye exam in the past 12 months? Yes   Have you had a dental exam in the past 12 months? No   Are you checking your feet? Yes   How many days per week are you checking your feet? 7     Dietary Intake   Breakfast cereal (conflakes with 2 tsp sugar) whole milk, coffee with cream and 1 T sugar  9 or skips   Snack (morning) none   Lunch banana or ham and cheese sandwich on white and  soup   Snack (afternoon) none   Dinner chilli or salad with chicken   Snack (evening) none   Beverage(s) whole milk, water. sweet tea, occasional Mt. Dew, coffee with cream and sugar, sprite or coke zero     Exercise   Exercise Type Light (walking / raking leaves)   How many days per week to you exercise? 30  8000 steps per day   How many minutes per day do you exercise? 5   Total minutes per week of exercise 150     Patient Education   Previous Diabetes Education Yes (please comment)  for v-go   Disease state  Definition of diabetes, type 1 and 2, and the diagnosis of diabetes   Nutrition management  Role of diet in the treatment of diabetes and the relationship between the three main macronutrients and blood glucose level;Food label reading, portion sizes and measuring food.;Carbohydrate counting;Meal options for control of blood glucose level and chronic complications.;Information on hints to eating out and maintain blood glucose control.   Physical activity and exercise  Role of exercise on diabetes management, blood pressure control and cardiac health.   Monitoring Identified appropriate SMBG and/or A1C goals.;Yearly dilated eye exam;Daily foot exams   Chronic complications Relationship between chronic complications and blood glucose control;Assessed and discussed foot care and prevention of foot problems;Retinopathy and reason for yearly dilated eye exams;Dental care   Psychosocial adjustment Role of stress on diabetes   Personal strategies to promote health Lifestyle issues that need to be addressed for better diabetes care     Individualized Goals (developed by patient)   Nutrition Follow meal plan discussed   Physical Activity Exercise 5-7 days per week;30 minutes per day   Medications take my medication as prescribed   Monitoring  test my blood glucose as discussed   Problem Solving about reminders to eat and take medication as well as get balance in cargiving to find time to  care for herself.   Reducing Risk examine blood glucose patterns;do foot checks daily   Health Coping discuss diabetes with (comment)  MD/RD     Post-Education Assessment   Patient understands the diabetes disease and treatment process. Demonstrates understanding / competency   Patient understands incorporating nutritional management into lifestyle. Demonstrates understanding / competency   Patient undertands incorporating physical activity into lifestyle. Demonstrates understanding / competency   Patient understands using medications safely. Demonstrates understanding / competency   Patient understands monitoring blood glucose, interpreting and using results Demonstrates understanding / competency   Patient understands prevention, detection, and treatment of acute complications. Demonstrates understanding / competency   Patient understands prevention, detection, and treatment of chronic complications. Demonstrates understanding / competency   Patient understands how to develop strategies to address psychosocial issues. Needs Review   Patient understands how to develop strategies to promote health/change behavior. Needs Review     Outcomes   Expected Outcomes  Demonstrated interest in learning. Expect positive outcomes   Future DMSE 4-6 wks   Program Status Completed      Individualized Plan for Diabetes Self-Management Training:   Learning Objective:  Patient will have a greater understanding of diabetes self-management. Patient education plan is to attend individual and/or group sessions per assessed needs and concerns.   Plan:   Patient Instructions  Rethink what you drink.  Choose beverages without carbohydrate. Avoid skipping meals. Remember to take your medicine.  Aim for 3 Carb Choices per meal (45 grams) +/- 1 either way  Aim for 0-1 Carbs per snack if hungry  Include protein in moderation with your meals and snacks Consider reading food labels for Total Carbohydrate and  Fat Grams of foods     Expected Outcomes:  Demonstrated interest in learning. Expect positive outcomes  Education material provided: Living Well with Diabetes, Food label handouts, A1C conversion sheet, Meal plan card, My Plate and Snack sheet, label reading  If problems or questions, patient to contact team via:  Phone and Email  Future DSME appointment: 4-6 wks

## 2016-09-03 ENCOUNTER — Other Ambulatory Visit: Payer: Self-pay | Admitting: *Deleted

## 2016-09-03 NOTE — Telephone Encounter (Signed)
Last Lipid Profile 01/04/2015.  Ok to refill?

## 2016-09-04 MED ORDER — ATORVASTATIN CALCIUM 80 MG PO TABS: 80.0000 mg | ORAL_TABLET | Freq: Every day | ORAL | 0 refills | Status: DC

## 2016-09-04 NOTE — Telephone Encounter (Signed)
Have pt make appt for fating labs for cholesterol only.. Or have her check  With next ENDO appt. Okay to refill.

## 2016-09-04 NOTE — Telephone Encounter (Signed)
Appointment scheduled for 09/11/2016 at 9:30 am to see Dr. Diona Browner for Left Side Pain and to have her Lipid Panel checked.  Patient is aware to come in fasting.

## 2016-09-07 ENCOUNTER — Ambulatory Visit (INDEPENDENT_AMBULATORY_CARE_PROVIDER_SITE_OTHER): Payer: 59 | Admitting: Internal Medicine

## 2016-09-07 ENCOUNTER — Encounter: Payer: Self-pay | Admitting: Internal Medicine

## 2016-09-07 VITALS — BP 100/67 | HR 105 | Ht 61.0 in | Wt 138.0 lb

## 2016-09-07 DIAGNOSIS — E039 Hypothyroidism, unspecified: Secondary | ICD-10-CM

## 2016-09-07 DIAGNOSIS — E1310 Other specified diabetes mellitus with ketoacidosis without coma: Secondary | ICD-10-CM

## 2016-09-07 DIAGNOSIS — E1110 Type 2 diabetes mellitus with ketoacidosis without coma: Secondary | ICD-10-CM

## 2016-09-07 DIAGNOSIS — Z794 Long term (current) use of insulin: Secondary | ICD-10-CM | POA: Diagnosis not present

## 2016-09-07 MED ORDER — INSULIN LISPRO 100 UNIT/ML ~~LOC~~ SOLN: 15.0000 [IU] | Freq: Three times a day (TID) | SUBCUTANEOUS | 5 refills | Status: DC

## 2016-09-07 MED ORDER — BASAGLAR KWIKPEN 100 UNIT/ML ~~LOC~~ SOPN: PEN_INJECTOR | SUBCUTANEOUS | 5 refills | Status: DC

## 2016-09-07 MED ORDER — METFORMIN HCL ER 500 MG PO TB24: 1000.0000 mg | ORAL_TABLET | Freq: Two times a day (BID) | ORAL | 3 refills | Status: DC

## 2016-09-07 NOTE — Progress Notes (Signed)
Patient ID: [name] [name], female   DOB: 1961/03/04, 55 y.o.   MRN: [id number]  HPI: [name] [name] is a 55 y.o.-year-old female, initially referred by her PCP, Dr.Bedsole, for management of DM2, dx in 2012, insulin-dependent since 2015, uncontrolled, without complications and also hypothyroidism. She previously saw Dr Howell Rucks, who since left the practice.   She was admitted 07/24/2016 for DKA after she developed HA and nausea. CGB >900. She was taken off Metformin and Invokana. Now only on insulins.  Since last visit, she had nutrition class and carb counting training. She is interested to start an insulin pump.  DM2: Last hemoglobin A1c was: Lab Results  Component Value Date   HGBA1C 14.5 07/17/2016   HGBA1C 14.7 (H) 09/06/2015   HGBA1C 12.0 (H) 04/26/2015   Pt was on a regimen of: - VGo 40 with 6 clicks per meal - her granddaughter rips it off. She lately did not have it attached because of this. Insurance not covering it completely.  - Invokana 300 mg daily in am  Now on: - Basaglar 40 units at bedtime.  - Humalog 3x a day to: - 12 units before a smaller meal  - 16 units before a larger meal  She tried (per review of Dr. Boyd Kerbs note): - Tried Metformin and states that "it wasn't working". Probably affected her IBS symptoms as well, but doesn't recall specifically.  - Then tried glipizide and that " didn't work either".  - Has been on insulin since 6 + months ~2015. - Started on lantus, but having problems with compliance as having to force herself to take the shot. Doesn't like injectable therapy. Trying to get her husband to give her the injections.  - Has been on Novolog before but doesn't like the shots.  - Switched from Lantus to [place] with Humalog Jan 2016  Pt checks her sugars 2-3x a day >> sugars are still very high:  - am: n/c >> 335-371 >> 212-370, 417 - 2h after b'fast: n/c >> 368, 540 >> 349-416 - before lunch: n/c >> 327-427 >> 210-385 - 2h after lunch: n/c >> 473  >> 304-448 - before dinner: n/c >> 274-497 >> 143-383 - 2h after dinner: n/c >> 351-419 - bedtime: n/c >> 268-402, 514 >> 350-406, 514 - nighttime: 346  Glucometer: OneTouch Verio Flex meter  Pt's meals are: - Breakfast: toast or Glucerna/boost - Lunch: 1/2 sandwich - Dinner: meat + veggies, baked potato.  - Snacks: chips, Mtn Dew  - no CKD, last BUN/creatinine:  Lab Results  Component Value Date   BUN 12 08/02/2016   CREATININE 0.76 08/02/2016   - last set of lipids: Lab Results  Component Value Date   CHOL 213 (H) 01/04/2015   HDL 44.20 01/04/2015   LDLCALC 129 (H) 01/04/2015   LDLDIRECT 206.0 07/27/2014   TRIG 199.0 (H) 01/04/2015   CHOLHDL 5 01/04/2015   - last eye exam was: Abstract on 03/08/2016  Component Date Value Ref Range Status  . HM Diabetic Eye Exam 02/21/2016 No Retinopathy  No Retinopathy Final   - no numbness and tingling in her feet.  Hypothyroidism: - dx 2008 - on LT4 200 >> 212.5 mcg daily - takes LT4: Fasting In am With water Eats >60 min later No Iron, Calcium, MVI, PPI  Her TSH levels are very fluctuating due to poor compliance with her levothyroxine. However, she tells me that she is now taking it regularly.  Lab Results  Component Value Date   TSH  31.821 (H) 07/25/2016   TSH 59.96 (H) 09/06/2015   TSH 31.59 (H) 04/26/2015   TSH 0.155 (L) 01/27/2015   TSH 13.32 (H) 01/04/2015   TSH 23.39 (H) 10/13/2013   TSH 11.19 (H) 10/06/2013   TSH 0.015 (L) 09/18/2013   TSH 9.09 (H) 06/23/2013   TSH 8.43 (H) 05/13/2012   FREET4 0.36 (L) 09/06/2015   FREET4 0.26 (L) 04/26/2015   FREET4 2.30 (H) 01/27/2015   FREET4 0.57 (L) 01/04/2015   FREET4 0.56 (L) 07/27/2014   FREET4 0.56 (L) 01/08/2014   FREET4 2.28 (H) 10/29/2013   FREET4 0.65 08/26/2012   FREET4 0.63 05/20/2012   FREET4 0.63 11/30/2011   ROS: Constitutional: no weight gain/loss, no fatigue, no subjective hyperthermia/hypothermia Eyes: no blurry vision, no xerophthalmia ENT:  no sore throat, no nodules palpated in throat, no dysphagia/odynophagia, no hoarseness Cardiovascular: no CP/SOB/palpitations/leg swelling Respiratory: no cough/SOB Gastrointestinal: no N/V/D/C/+ heartburn Musculoskeletal: no muscle/joint aches Skin: no rashes Neurological: no tremors/numbness/tingling/dizziness  I reviewed pt's medications, allergies, PMH, social hx, family hx, and changes were documented in the history of present illness. Otherwise, unchanged from my initial visit note.  Past Medical History:  Diagnosis Date  . Anxiety   . B12 deficiency   . GERD (gastroesophageal reflux disease)   . Hyperlipidemia   . Hypertension   . Hypothyroidism   . IBS (irritable bowel syndrome)   . Intermittent vertigo   . 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 Leg cramping    "@ night" (09/18/2013)  . Migraine    "once q couple months" (09/18/2013)  . Osteoporosis   . Peripheral neuropathy ([place])   . Type II diabetes mellitus ([place])   . Umbilical hernia    unrepaired (09/18/2013)   Past Surgical History:  Procedure Laterality Date  . CESAREAN SECTION  1989  . LAPAROSCOPIC ASSISTED VAGINAL HYSTERECTOMY  09/23/2012   Procedure: LAPAROSCOPIC ASSISTED VAGINAL HYSTERECTOMY;  Surgeon: Linda Hedges, DO;  Location: [facility];  Service: Gynecology;  Laterality: [name] [name];  pull Dr Gregor Hams instrument  . TUBAL LIGATION Bilateral 1992  . VAGINAL HYSTERECTOMY  2013   Social History   Social History  . Marital status: Married    Spouse name: [name] [name]  . Number of children: 2  . Years of education: [name] [name]   Occupational History  . UNEMPLOYED Unemployed    homemaker   Social History Main Topics  . Smoking status: Former Smoker    Years: 0.20    Types: Cigarettes    Quit date: 12/10/1990  . Smokeless tobacco: Never Used  . Alcohol use No  . Drug use: No  . Sexual activity: Yes   Other Topics Concern  . Not on file   Social History Narrative   Regular exercise- no     Diet- lots of mountain dew, limits fast foods    Current Outpatient Prescriptions on File Prior to Visit  Medication Sig Dispense Refill  . atorvastatin (LIPITOR) 80 MG tablet Take 1 tablet (80 mg total) by mouth daily at 6 PM. 90 tablet 0  . cetirizine (ZYRTEC) 10 MG tablet Take 1 tablet (10 mg total) by mouth daily. 30 tablet 11  . eletriptan (RELPAX) 40 MG tablet One tablet by mouth at onset of headache. May repeat in 2 hours if headache persists or recurs. 10 tablet 0  . fluticasone (FLONASE) 50 MCG/ACT nasal spray Place 2 sprays into both nostrils daily. 16 g 6  . glucose blood test strip Use 3x a day -  One Touch Verio Flex meter 200 each 3  . Insulin Glargine (BASAGLAR KWIKPEN) 100 UNIT/ML SOPN INJECT 40 UNITS INTO THE SKIN DAILY AT 10 PM. 15 mL 0  . insulin lispro (HUMALOG) 100 UNIT/ML injection Inject 12 Units into the skin 3 (three) times daily before meals.     . Insulin Pen Needle (CAREFINE PEN NEEDLES) 32G X 4 MM MISC Use 4x a day 200 each 11  . levothyroxine (SYNTHROID, LEVOTHROID) 200 MCG tablet TAKE 1 TABLET BY MOUTH DAILY TAKE ALONG WITH THE 12.5 MG DOSE. 60 tablet 1  . levothyroxine (SYNTHROID, LEVOTHROID) 25 MCG tablet Take 0.5 tablets (12.5 mcg total) by mouth daily before breakfast. In addition to 200 mcg daily for TDD 212.5 mcg 30 tablet 2  . ondansetron (ZOFRAN-ODT) 8 MG disintegrating tablet Take 8 mg by mouth every 8 (eight) hours as needed for nausea or vomiting.    . ONE TOUCH LANCETS MISC Use 3x a day - One Touch Verio Flex meter 200 each 3  . sodium chloride (OCEAN) 0.65 % nasal spray 2 sprays per nostril  3 times daily prn congestion 30 mL 0  . Insulin Glargine (BASAGLAR KWIKPEN) 100 UNIT/ML SOPN Inject 20 Units into the skin at bedtime.     No current facility-administered medications on file prior to visit.    Allergies  Allergen Reactions  . Codeine Hives, Anxiety and Other (See Comments)    REACTION: N \\T \ V and hyper. Pt can take Percocet.   Family History   Problem Relation Age of Onset  . Diabetes Other   . Heart disease Other   . Heart failure Mother   . Diabetes Maternal Grandmother   . Alzheimer's disease Maternal Grandmother   . Coronary artery disease Maternal Grandmother   . Heart attack Maternal Grandfather   . Heart failure Brother    PE: BP 100/67   Pulse (!) 105   Ht 5\' 1"  (1.549 m)   Wt 138 lb (62.6 kg)   LMP 08/11/2012   BMI 26.07 kg/m  Body mass index is 26.07 kg/m. Wt Readings from Last 3 Encounters:  09/07/16 138 lb (62.6 kg)  08/23/16 143 lb (64.9 kg)  08/07/16 145 lb (65.8 kg)   Constitutional: overweight, in NAD Eyes: PERRLA, EOMI, no exophthalmos ENT: moist mucous membranes, no thyromegaly, no cervical lymphadenopathy Cardiovascular: RRR, No MRG Respiratory: CTA B Gastrointestinal: abdomen soft, NT, ND, BS+ Musculoskeletal: no deformities, strength intact in all 4 Skin: moist, warm, no rashes Neurological: no tremor with outstretched hands, DTR normal in all 4  ASSESSMENT: 1. DM2, insulin-dependent, uncontrolled, with DKA - Noncompliant with CBG checks and meds  2. Hypothyroidism -  uncontrolled   PLAN:  1. Patient with long-standing, uncontrolled diabetes, with med noncompliance and a hospitalization for DKA. That this, we started her back on insulins and advised her to start checking sugars (given new meter).She is now doing a better job with taking her insulin and checking her sugars. She is still off metformin and iNVOKANA. At this visit, since sugars are still poor despite compliance with the insulin doses, I will increase his doses and I will also try to add back metformin.  - She needs to be tested for DM1, but I cannot check a C peptide yet as her sugars are too high. We may be able to check at next visit. She will also need this for insurance purposes if he decides to go with the insulin pump. - She already had carb counting training, will  refer her for diabetes education (pre-pump training)  -  I suggested to:  Patient Instructions  Please increase: - Basaglar 50 units at bedtime.  - Humalog 3x a day to: - 15 units before a smaller meal  - 20 units before a larger meal  Please add back Metformin ER 500 mg 2x a day and increase to 1000 mg 2x a day in 4 days.  Please continue Levothyroxine 212.5 mcg daily.  Take the thyroid hormone every day, with water, at least 30 minutes before breakfast, separated by at least 4 hours from: - acid reflux medications - calcium - iron - multivitamins  Please return in 1.5 months with your sugar log.   - start checking sugars at different times of the day - check 3 times a day, rotating checks - advised for yearly eye exams >> she is UTD - will need a Lipid panel check - will have this done in few days by PCP  - Return to clinic in 1.5 mo with sugar log    2. Hypothyroidism - This is uncontrolled due to noncompliance with her levothyroxine. Will continue Levothyroxine: 212 mcg daily. she tells me that she has now much better compliance compared to before. Her weight has decreased, and she feels better.  - again advised her to take the thyroid hormone every day, with water, >30 minutes before breakfast, separated by >4 hours from acid reflux medications, calcium, iron, multivitamins. - We'll recheck her thyroid tests at the next lab draw, which would be in few days in PCPs office. I ordered the labs.  Philemon Kingdom, MD PhD [facility] End[facility]

## 2016-09-07 NOTE — Patient Instructions (Addendum)
Please increase: - Basaglar 50 units at bedtime.  - Humalog 3x a day to: - 15 units before a smaller meal  - 20 units before a larger meal  Please add back Metformin ER 500 mg 2x a day and increase to 1000 mg 2x a day in 4 days.  Please continue Levothyroxine 212.5 mcg daily.  Take the thyroid hormone every day, with water, at least 30 minutes before breakfast, separated by at least 4 hours from: - acid reflux medications - calcium - iron - multivitamins  Please return in 1.5 months with your sugar log.

## 2016-09-11 ENCOUNTER — Ambulatory Visit (INDEPENDENT_AMBULATORY_CARE_PROVIDER_SITE_OTHER): Payer: 59 | Admitting: Family Medicine

## 2016-09-11 ENCOUNTER — Encounter: Payer: Self-pay | Admitting: Family Medicine

## 2016-09-11 ENCOUNTER — Ambulatory Visit (INDEPENDENT_AMBULATORY_CARE_PROVIDER_SITE_OTHER)
Admission: RE | Admit: 2016-09-11 | Discharge: 2016-09-11 | Disposition: A | Discharge status: Home / Self Care | Payer: 59 | Source: Ambulatory Visit | Attending: Family Medicine | Admitting: Family Medicine

## 2016-09-11 VITALS — BP 106/66 | HR 92 | Temp 97.8°F | Ht 61.0 in | Wt 138.5 lb

## 2016-09-11 DIAGNOSIS — R109 Unspecified abdominal pain: Secondary | ICD-10-CM

## 2016-09-11 DIAGNOSIS — E781 Pure hyperglyceridemia: Secondary | ICD-10-CM | POA: Diagnosis not present

## 2016-09-11 DIAGNOSIS — K581 Irritable bowel syndrome with constipation: Secondary | ICD-10-CM | POA: Diagnosis not present

## 2016-09-11 LAB — LIPID PANEL
CHOLESTEROL: 251 mg/dL — AB (ref 0–200)
HDL: 45.8 mg/dL (ref >39.00)
LDL CALC: 170 mg/dL — AB (ref 0–99)
NonHDL: 205.51
TRIGLYCERIDES: 177 mg/dL — AB (ref 0.0–149.0)
Total CHOL/HDL Ratio: 5
VLDL: 35.4 mg/dL (ref 0.0–40.0)

## 2016-09-11 LAB — POC URINALSYSI DIPSTICK (AUTOMATED)
BILIRUBIN UA: NEGATIVE
Blood, UA: NEGATIVE
KETONES UA: NEGATIVE
LEUKOCYTES UA: NEGATIVE
Nitrite, UA: NEGATIVE
PH UA: 6
Protein, UA: NEGATIVE
SPEC GRAV UA: 1.025
Urobilinogen, UA: 0.2

## 2016-09-11 LAB — COMPREHENSIVE METABOLIC PANEL
ALK PHOS: 56 U/L (ref 39–117)
ALT: 11 U/L (ref 0–35)
AST: 11 U/L (ref 0–37)
Albumin: 4 g/dL (ref 3.5–5.2)
BILIRUBIN TOTAL: 0.3 mg/dL (ref 0.2–1.2)
BUN: 14 mg/dL (ref 6–23)
CO2: 30 mEq/L (ref 19–32)
CREATININE: 0.66 mg/dL (ref 0.40–1.20)
Calcium: 9.4 mg/dL (ref 8.4–10.5)
Chloride: 99 mEq/L (ref 96–112)
GFR: 99.5 mL/min (ref >60.00)
GLUCOSE: 285 mg/dL — AB (ref 70–99)
POTASSIUM: 4.3 meq/L (ref 3.5–5.1)
SODIUM: 135 meq/L (ref 135–145)
TOTAL PROTEIN: 7.4 g/dL (ref 6.0–8.3)

## 2016-09-11 LAB — CBC WITH DIFFERENTIAL/PLATELET
BASOS PCT: 0.3 % (ref 0.0–3.0)
Basophils Absolute: 0 10*3/uL (ref 0.0–0.1)
EOS PCT: 1.2 % (ref 0.0–5.0)
Eosinophils Absolute: 0.1 10*3/uL (ref 0.0–0.7)
HCT: 46.9 % — ABNORMAL HIGH (ref 36.0–46.0)
HEMOGLOBIN: 16 g/dL — AB (ref 12.0–15.0)
LYMPHS PCT: 30.5 % (ref 12.0–46.0)
Lymphs Abs: 3.7 10*3/uL (ref 0.7–4.0)
MCHC: 34.2 g/dL (ref 30.0–36.0)
MCV: 85.9 fl (ref 78.0–100.0)
MONOS PCT: 5.8 % (ref 3.0–12.0)
Monocytes Absolute: 0.7 10*3/uL (ref 0.1–1.0)
NEUTROS ABS: 7.5 10*3/uL (ref 1.4–7.7)
NEUTROS PCT: 62.2 % (ref 43.0–77.0)
Platelets: 316 10*3/uL (ref 150.0–400.0)
RBC: 5.46 Mil/uL — ABNORMAL HIGH (ref 3.87–5.11)
RDW: 14 % (ref 11.5–15.5)
WBC: 12 10*3/uL — ABNORMAL HIGH (ref 4.0–10.5)

## 2016-09-11 MED ORDER — DICYCLOMINE HCL 10 MG PO CAPS: 10.0000 mg | ORAL_CAPSULE | Freq: Three times a day (TID) | ORAL | 1 refills | Status: DC

## 2016-09-11 MED ORDER — EZETIMIBE 10 MG PO TABS: 10.0000 mg | ORAL_TABLET | Freq: Every day | ORAL | 11 refills | Status: DC

## 2016-09-11 NOTE — Progress Notes (Signed)
Pre visit review using our clinic review tool, if applicable. No additional management support is needed unless otherwise documented below in the visit note. 

## 2016-09-11 NOTE — Assessment & Plan Note (Signed)
Will eval with labs and X-ray to rule out   Possibly due to IBS and constipation. Increase fiber water.  May need to start bentyl and Miralax to treat.   If not improving consider CT abd, but no red flags on exam today.

## 2016-09-11 NOTE — Assessment & Plan Note (Signed)
Most likely cause of pain but will eval with labs and x-ray to rule out more concerning issues given her history.

## 2016-09-11 NOTE — Addendum Note (Signed)
Addended by: Eliezer Lofts E on: 09/11/2016 05:21 PM   Modules accepted: Orders

## 2016-09-11 NOTE — Patient Instructions (Addendum)
Stop at lab and X-ray on way out.

## 2016-09-11 NOTE — Progress Notes (Signed)
   Subjective:    Patient ID: [name] [name], female    DOB: 1961-07-10, 55 y.o.   MRN: [id number]  HPI   55 year old female with history of poorly DM, IBS, colitis , afib presents with  New onset pain in left  Abdomen. Pain to touch, nausea.  She is straining to have BMs.. BM still twice a day, harder than usual.  Anything she eats makes her stomach hurt.  No fever.  No blood in stool lately.  Occ acid reflux.. Belching up acid liquid... Occurring few times a week.  occ burning with urination   No history of kidney stones.  She eats minimal fiber, some water.  She is working on getting sugar under control. Recently started metformin back in last few  Days.  Wt Readings from Last 3 Encounters:  09/11/16 138 lb 8 oz (62.8 kg)  09/07/16 138 lb (62.6 kg)  08/23/16 143 lb (64.9 kg)  Body mass index is 26.17 kg/m.     Review of Systems  Constitutional: Positive for fatigue. Negative for fever.  HENT: Negative for ear pain.   Eyes: Negative for pain.  Respiratory: Negative for shortness of breath.   Cardiovascular: Negative for chest pain and leg swelling.  Gastrointestinal: Positive for abdominal distention, abdominal pain and constipation. Negative for blood in stool.       Objective:   Physical Exam  Constitutional: Vital signs are normal. She appears well-developed and well-nourished. She is cooperative.  Non-toxic appearance. She does not appear ill. No distress.  HENT:  Head: Normocephalic.  Right Ear: Hearing, tympanic membrane, external ear and ear canal normal. Tympanic membrane is not erythematous, not retracted and not bulging.  Left Ear: Hearing, tympanic membrane, external ear and ear canal normal. Tympanic membrane is not erythematous, not retracted and not bulging.  Nose: No mucosal edema or rhinorrhea. Right sinus exhibits no maxillary sinus tenderness and no frontal sinus tenderness. Left sinus exhibits no maxillary sinus tenderness and no frontal sinus  tenderness.  Mouth/Throat: Uvula is midline, oropharynx is clear and moist and mucous membranes are normal.  Eyes: Conjunctivae, EOM and lids are normal. Pupils are equal, round, and reactive to light. Lids are everted and swept, no foreign bodies found.  Neck: Trachea normal and normal range of motion. Neck supple. Carotid bruit is not present. No thyroid mass and no thyromegaly present.  Cardiovascular: Normal rate, regular rhythm, S1 normal, S2 normal, normal heart sounds, intact distal pulses and normal pulses.  Exam reveals no gallop and no friction rub.   No murmur heard. Pulmonary/Chest: Effort normal and breath sounds normal. No tachypnea. No respiratory distress. She has no decreased breath sounds. She has no wheezes. She has no rhonchi. She has no rales.  Abdominal: Soft. Normal appearance and bowel sounds are normal. There is no hepatosplenomegaly. There is tenderness in the left lower quadrant. There is CVA tenderness. There is no rigidity and no guarding. A hernia is present. Hernia confirmed positive in the ventral area. Hernia confirmed negative in the right inguinal area and confirmed negative in the left inguinal area.  Neurological: She is alert.  Skin: Skin is warm, dry and intact. No rash noted.  Psychiatric: Her speech is normal and behavior is normal. Judgment and thought content normal. Her mood appears not anxious. Cognition and memory are normal. She does not exhibit a depressed mood.          Assessment & Plan:

## 2016-09-14 ENCOUNTER — Telehealth: Payer: Self-pay | Admitting: Family Medicine

## 2016-09-14 ENCOUNTER — Telehealth: Payer: Self-pay

## 2016-09-14 NOTE — Telephone Encounter (Signed)
Pt needs repeat eval in office. Have her follow up next week with myself or other MD or if needs to be seen sooner get [facility] clinic OV or go to urgent care. If severe pain go to ER for possible CT.

## 2016-09-14 NOTE — Telephone Encounter (Signed)
Pt left v/m; pt was seen on 09/11/16; pts stomach is hurting all the way around now and cramping is worse. Pt request different med to [facility]. Pt request cb.

## 2016-09-14 NOTE — Telephone Encounter (Signed)
[facility] Patient Name: [name] [name] DOB: [birth year] Initial Comment Caller states, she had has issues with her stomach and still having constipation - stomach spasms. She is taking Mirlax and cramping rx. needs something stronger. Verified. Nurse Assessment Nurse: Martyn Ehrich, RN, Fel[place] Date/Time (Eastern Time): 09/14/2016 3:45:12 PM Confirm and document reason for call. If symptomatic, describe symptoms. You must click the next button to save text entered. ---Pt is constipated and is on miralax per MD above - saw MD above for this Tues. Only one BM since she started miralax (hard and not bunch) and before that it had been a few d since she went. Xray showed constipation. Abdominal pain is level 7. Has the patient traveled out of the country within the last 30 days? ---No Does the patient have any new or worsening symptoms? ---Yes Will a triage be completed? ---YesRelated visit to physician within the last 2 weeks? ---Yes Does the PT have any chronic conditions? (i.e. diabetes, asthma, etc.) ---Yes List chronic conditions. ---DM, she has IBS but it was doing well until 1 mo. ago Is the patient pregnant or possibly pregnant? (Ask all females between the ages of 27-55) ---No Is this a behavioral health or substance abuse call? ---No Guidelines Guideline Title Affirmed Question Affirmed Notes Abdominal Pain - Female [1] SEVERE pain (e.g., excruciating) AND [2] present > 1 hour Final Disposition User Go to ED Now Martyn Ehrich, RN, [facility] - ED Disagree/Comply: Comply Call Id: [phone]

## 2016-09-14 NOTE — Telephone Encounter (Signed)
[name] [name] notified as instructed by telephone.  She would prefer to follow up with Dr. Diona Browner next week but will need to call back to schedule.  I did give [name] [name] the [facility] phone number just in case she feels she can't wait until next week and wants to be seen tomorrow.

## 2016-09-14 NOTE — Telephone Encounter (Signed)
See phone note

## 2016-10-02 ENCOUNTER — Other Ambulatory Visit: Payer: Self-pay

## 2016-10-02 MED ORDER — INSULIN LISPRO 100 UNIT/ML ~~LOC~~ SOLN: 15.0000 [IU] | Freq: Three times a day (TID) | SUBCUTANEOUS | 5 refills | Status: DC

## 2016-10-02 MED ORDER — BASAGLAR KWIKPEN 100 UNIT/ML ~~LOC~~ SOPN: PEN_INJECTOR | SUBCUTANEOUS | 0 refills | Status: DC

## 2016-10-05 ENCOUNTER — Ambulatory Visit: Payer: 59 | Admitting: Dietician

## 2016-10-10 ENCOUNTER — Ambulatory Visit: Payer: 59 | Admitting: Nutrition

## 2016-10-17 ENCOUNTER — Other Ambulatory Visit: Payer: Self-pay | Admitting: Family Medicine

## 2016-10-17 NOTE — Telephone Encounter (Signed)
Last office visit 09/11/2016.  Ondansetron ODT 8 mg on medication list.  Ok to refill?

## 2016-10-19 ENCOUNTER — Ambulatory Visit: Payer: 59 | Admitting: Internal Medicine

## 2016-10-25 ENCOUNTER — Ambulatory Visit: Payer: 59 | Admitting: Dietician

## 2016-11-05 ENCOUNTER — Ambulatory Visit: Payer: 59 | Admitting: Family Medicine

## 2016-11-05 DIAGNOSIS — Z0289 Encounter for other administrative examinations: Secondary | ICD-10-CM

## 2016-12-06 ENCOUNTER — Ambulatory Visit: Payer: 59 | Admitting: Internal Medicine

## 2016-12-13 ENCOUNTER — Ambulatory Visit: Payer: 59 | Admitting: Dietician

## 2016-12-18 ENCOUNTER — Other Ambulatory Visit: Payer: Self-pay | Admitting: Internal Medicine

## 2016-12-20 NOTE — Telephone Encounter (Signed)
Refill of Insulin Glargine [facility]WIK[facility]) [address] UNIT/[address] \ [id number] [place], [place] - [address] AT [place] [phone] (Phone) [phone] (Fax)

## 2016-12-24 ENCOUNTER — Other Ambulatory Visit: Payer: Self-pay

## 2017-01-03 ENCOUNTER — Other Ambulatory Visit: Payer: Self-pay | Admitting: Family Medicine

## 2017-01-03 NOTE — Telephone Encounter (Signed)
Last office visit 09/11/2016.  Last refilled 10/19/2016 for #30 with no refills.  Ok to refill?

## 2017-01-22 ENCOUNTER — Ambulatory Visit (INDEPENDENT_AMBULATORY_CARE_PROVIDER_SITE_OTHER): Payer: 59 | Admitting: Family Medicine

## 2017-01-22 ENCOUNTER — Encounter: Payer: Self-pay | Admitting: Family Medicine

## 2017-01-22 VITALS — BP 104/74 | HR 97 | Temp 97.5°F | Ht 61.0 in | Wt 127.8 lb

## 2017-01-22 DIAGNOSIS — R1013 Epigastric pain: Secondary | ICD-10-CM | POA: Insufficient documentation

## 2017-01-22 DIAGNOSIS — K219 Gastro-esophageal reflux disease without esophagitis: Secondary | ICD-10-CM

## 2017-01-22 DIAGNOSIS — R6881 Early satiety: Secondary | ICD-10-CM

## 2017-01-22 DIAGNOSIS — E1310 Other specified diabetes mellitus with ketoacidosis without coma: Secondary | ICD-10-CM | POA: Diagnosis not present

## 2017-01-22 DIAGNOSIS — K3184 Gastroparesis: Secondary | ICD-10-CM

## 2017-01-22 DIAGNOSIS — E1143 Type 2 diabetes mellitus with diabetic autonomic (poly)neuropathy: Secondary | ICD-10-CM | POA: Insufficient documentation

## 2017-01-22 DIAGNOSIS — Z794 Long term (current) use of insulin: Secondary | ICD-10-CM

## 2017-01-22 DIAGNOSIS — E1110 Type 2 diabetes mellitus with ketoacidosis without coma: Secondary | ICD-10-CM

## 2017-01-22 LAB — HEMOGLOBIN A1C: HEMOGLOBIN A1C: 12.3 % — AB (ref 4.6–6.5)

## 2017-01-22 LAB — COMPREHENSIVE METABOLIC PANEL
ALT: 9 U/L (ref 0–35)
AST: 9 U/L (ref 0–37)
Albumin: 4.5 g/dL (ref 3.5–5.2)
Alkaline Phosphatase: 64 U/L (ref 39–117)
BUN: 14 mg/dL (ref 6–23)
CALCIUM: 9.8 mg/dL (ref 8.4–10.5)
CHLORIDE: 101 meq/L (ref 96–112)
CO2: 29 meq/L (ref 19–32)
CREATININE: 0.6 mg/dL (ref 0.40–1.20)
GFR: 110.91 mL/min (ref >60.00)
Glucose, Bld: 302 mg/dL — ABNORMAL HIGH (ref 70–99)
Potassium: 3.8 mEq/L (ref 3.5–5.1)
SODIUM: 134 meq/L — AB (ref 135–145)
Total Bilirubin: 0.5 mg/dL (ref 0.2–1.2)
Total Protein: 7.5 g/dL (ref 6.0–8.3)

## 2017-01-22 LAB — LIPASE: Lipase: 44 U/L (ref 11.0–59.0)

## 2017-01-22 MED ORDER — OMEPRAZOLE 40 MG PO CPDR: 40.0000 mg | DELAYED_RELEASE_CAPSULE | Freq: Every day | ORAL | 1 refills | Status: DC

## 2017-01-22 NOTE — Progress Notes (Signed)
Pre visit review using our clinic review tool, if applicable. No additional management support is needed unless otherwise documented below in the visit note. 

## 2017-01-22 NOTE — Assessment & Plan Note (Signed)
Possible diabetic gastroparesis.Marland Kitchen Refer to GI for further evaluation.

## 2017-01-22 NOTE — Progress Notes (Signed)
   Subjective:    Patient ID: [name] [name], female    DOB: June 09, 1961, 56 y.o.   MRN: [id number]  HPI  56 year old female presents for reflux symptoms.  She is currently seeing GI for IBS, colitis on bentyl Using zofran for chronic nausea.  Today she reports return of reflux in last few weeks.  Wake in night gagging, sour taste in throat, belching. Chest pain, heartburn.   Tried omeprazole 20 mg  in last few weeks.  No longer helping.  She has poor appetite.  Early satiety.  She does have some epigastric pain, tender constantly.   She is concerned she may have diabetic gastroparesis.   Last EGD and colonoscopy: 2009 nml  Dr Erin Hearing Readings from Last 3 Encounters:  01/22/17 127 lb 12 oz (57.9 kg)  09/11/16 138 lb 8 oz (62.8 kg)  09/07/16 138 lb (62.6 kg)     Review of Systems  Constitutional: Positive for fatigue. Negative for fever.  HENT: Negative for ear pain.   Eyes: Negative for pain.  Respiratory: Positive for chest tightness. Negative for cough, shortness of breath and wheezing.   Cardiovascular: Positive for chest pain. Negative for palpitations and leg swelling.  Gastrointestinal: Positive for abdominal distention and abdominal pain. Negative for constipation.  Genitourinary: Negative for dysuria.  Psychiatric/Behavioral: Positive for dysphoric mood. The patient is not nervous/anxious.        Irritability, insomnia       Objective:   Physical Exam  Constitutional: Vital signs are normal. She appears well-developed and well-nourished. She is cooperative.  Non-toxic appearance. She does not appear ill. No distress.  HENT:  Head: Normocephalic.  Right Ear: Hearing, tympanic membrane, external ear and ear canal normal. Tympanic membrane is not erythematous, not retracted and not bulging.  Left Ear: Hearing, tympanic membrane, external ear and ear canal normal. Tympanic membrane is not erythematous, not retracted and not bulging.  Nose: No mucosal  edema or rhinorrhea. Right sinus exhibits no maxillary sinus tenderness and no frontal sinus tenderness. Left sinus exhibits no maxillary sinus tenderness and no frontal sinus tenderness.  Mouth/Throat: Uvula is midline, oropharynx is clear and moist and mucous membranes are normal.  Eyes: Conjunctivae, EOM and lids are normal. Pupils are equal, round, and reactive to light. Lids are everted and swept, no foreign bodies found.  Neck: Trachea normal and normal range of motion. Neck supple. Carotid bruit is not present. No thyroid mass and no thyromegaly present.  Cardiovascular: Normal rate, regular rhythm, S1 normal, S2 normal, normal heart sounds, intact distal pulses and normal pulses.  Exam reveals no gallop and no friction rub.   No murmur heard. Pulmonary/Chest: Effort normal and breath sounds normal. No tachypnea. No respiratory distress. She has no decreased breath sounds. She has no wheezes. She has no rhonchi. She has no rales.  Abdominal: Soft. Normal appearance and bowel sounds are normal. There is no hepatosplenomegaly. There is tenderness in the epigastric area. There is no rigidity, no rebound, no guarding and no CVA tenderness.  Points to area of pain radiating to central backn  Neurological: She is alert.  Skin: Skin is warm, dry and intact. No rash noted.  Psychiatric: Her speech is normal and behavior is normal. Judgment and thought content normal. Her mood appears not anxious. Cognition and memory are normal. She does not exhibit a depressed mood.          Assessment & Plan:

## 2017-01-22 NOTE — Assessment & Plan Note (Signed)
Eval with labs for liver or pancreas issues. Start 40 mg daily PPI. Trigger avoidance.

## 2017-01-22 NOTE — Assessment & Plan Note (Signed)
Noncomplicant. Overdue for re-eval with endo. Poorly controlled DM could be contributing to GI issues.

## 2017-01-22 NOTE — Patient Instructions (Addendum)
Start omeprazole 40 mg daily  Stop at front desk for referral to GI and stop at lab on way out.

## 2017-01-22 NOTE — Addendum Note (Signed)
Addended by: Ellamae Sia on: 01/22/2017 03:07 PM   Modules accepted: Orders

## 2017-01-23 ENCOUNTER — Encounter: Payer: Self-pay | Admitting: Nurse Practitioner

## 2017-01-23 ENCOUNTER — Other Ambulatory Visit: Payer: Self-pay | Admitting: Family Medicine

## 2017-01-23 ENCOUNTER — Ambulatory Visit (INDEPENDENT_AMBULATORY_CARE_PROVIDER_SITE_OTHER): Payer: 59 | Admitting: Nurse Practitioner

## 2017-01-23 VITALS — BP 108/66 | HR 60 | Ht 59.0 in | Wt 128.0 lb

## 2017-01-23 DIAGNOSIS — R110 Nausea: Secondary | ICD-10-CM

## 2017-01-23 DIAGNOSIS — R1319 Other dysphagia: Secondary | ICD-10-CM | POA: Diagnosis not present

## 2017-01-23 NOTE — Patient Instructions (Signed)
You have been scheduled for an endoscopy. Please follow written instructions given to you at your visit today. If you use inhalers (even only as needed), please bring them with you on the day of your procedure. Your physician has requested that you go to www.startemmi.com and enter the access code given to you at your visit today. This web site gives a general overview about your procedure. However, you should still follow specific instructions given to you by our office regarding your preparation for the procedure.  Continue the Prilosec.  We have provided you with Reflux literature.

## 2017-01-23 NOTE — Addendum Note (Signed)
Addended by: Ellamae Sia on: 01/23/2017 10:00 AM   Modules accepted: Orders

## 2017-01-24 LAB — HELICOBACTER PYLORI  SPECIAL ANTIGEN: H. PYLORI ANTIGEN STOOL: NOT DETECTED

## 2017-01-25 ENCOUNTER — Ambulatory Visit (AMBULATORY_SURGERY_CENTER): Payer: 59 | Admitting: Gastroenterology

## 2017-01-25 ENCOUNTER — Encounter: Payer: Self-pay | Admitting: Gastroenterology

## 2017-01-25 VITALS — BP 113/75 | HR 92 | Temp 97.7°F | Resp 18 | Ht 59.0 in | Wt 128.0 lb

## 2017-01-25 DIAGNOSIS — K319 Disease of stomach and duodenum, unspecified: Secondary | ICD-10-CM | POA: Diagnosis not present

## 2017-01-25 DIAGNOSIS — B9681 Helicobacter pylori [H. pylori] as the cause of diseases classified elsewhere: Secondary | ICD-10-CM | POA: Diagnosis not present

## 2017-01-25 DIAGNOSIS — R1310 Dysphagia, unspecified: Secondary | ICD-10-CM | POA: Diagnosis not present

## 2017-01-25 DIAGNOSIS — K2950 Unspecified chronic gastritis without bleeding: Secondary | ICD-10-CM | POA: Diagnosis not present

## 2017-01-25 DIAGNOSIS — K222 Esophageal obstruction: Secondary | ICD-10-CM

## 2017-01-25 DIAGNOSIS — K3189 Other diseases of stomach and duodenum: Secondary | ICD-10-CM

## 2017-01-25 DIAGNOSIS — R110 Nausea: Secondary | ICD-10-CM

## 2017-01-25 MED ORDER — SODIUM CHLORIDE 0.9 % IV SOLN: 500.0000 mL | INTRAVENOUS | Status: DC

## 2017-01-25 NOTE — Progress Notes (Signed)
Pt's states no medical or surgical changes since previsit or office visit. 

## 2017-01-25 NOTE — Progress Notes (Signed)
Report to PACU, RN, vss, BBS= Clear.  

## 2017-01-25 NOTE — Patient Instructions (Signed)
YOU HAD AN ENDOSCOPIC PROCEDURE TODAY AT [facility]:   Refer to the procedure report that was given to you for any specific questions about what was found during the examination.  If the procedure report does not answer your questions, please call your gastroenterologist to clarify.  If you requested that your care partner not be given the details of your procedure findings, then the procedure report has been included in a sealed envelope for you to review at your convenience later.  YOU SHOULD EXPECT: Some feelings of bloating in the abdomen. Passage of more gas than usual.  Walking can help get rid of the air that was put into your GI tract during the procedure and reduce the bloating. If you had a lower endoscopy (such as a colonoscopy or flexible sigmoidoscopy) you may notice spotting of blood in your stool or on the toilet paper. If you underwent a bowel prep for your procedure, you may not have a normal bowel movement for a few days.  Please Note:  You might notice some irritation and congestion in your nose or some drainage.  This is from the oxygen used during your procedure.  There is no need for concern and it should clear up in a day or so.  SYMPTOMS TO REPORT IMMEDIATELY:   Following upper endoscopy (EGD)  Vomiting of blood or coffee ground material  New chest pain or pain under the shoulder blades  Painful or persistently difficult swallowing  New shortness of breath  Fever of 100F or higher  Black, tarry-looking stools  For urgent or emergent issues, a gastroenterologist can be reached at any hour by calling [phone].   DIET:  Post Esophageal Dilation Diet.  Drink plenty of fluids but you should avoid alcoholic beverages for 24 hours.  ACTIVITY:  You should plan to take it easy for the rest of today and you should NOT DRIVE or use heavy machinery until tomorrow (because of the sedation medicines used during the test).    FOLLOW UP: Our staff will call  the number listed on your records the next business day following your procedure to check on you and address any questions or concerns that you may have regarding the information given to you following your procedure. If we do not reach you, we will leave a message.  However, if you are feeling well and you are not experiencing any problems, there is no need to return our call.  We will assume that you have returned to your regular daily activities without incident.  If any biopsies were taken you will be contacted by phone or by letter within the next 1-3 weeks.  Please call us at [phone] if you have not heard about the biopsies in 3 weeks.   Esophagitis and Stricture (handout given)  SIGNATURES/CONFIDENTIALITY: You and/or your care partner have signed paperwork which will be entered into your electronic medical record.  These signatures attest to the fact that that the information above on your After Visit Summary has been reviewed and is understood.  Full responsibility of the confidentiality of this discharge information lies with you and/or your care-partner.

## 2017-01-25 NOTE — Op Note (Signed)
[place] Patient Name: [name] [name] Procedure Date: 01/25/2017 2:39 PM MRN: [id number] Endoscopist: Milus Banister , MD Age: 56 Referring MD:  Date of Birth: [birth year] Gender: Female Account #: [id number] Procedure:                Upper GI endoscopy Indications:              Dysphagia, Nausea Medicines:                Monitored Anesthesia Care Procedure:                Pre-Anesthesia Assessment:                           - Prior to the procedure, a History and Physical                            was performed, and patient medications and                            allergies were reviewed. The patient's tolerance of                            previous anesthesia was also reviewed. The risks                            and benefits of the procedure and the sedation                            options and risks were discussed with the patient.                            All questions were answered, and informed consent                            was obtained. Prior Anticoagulants: The patient has                            taken no previous anticoagulant or antiplatelet                            agents. ASA Grade Assessment: III - A patient with                            severe systemic disease. After reviewing the risks                            and benefits, the patient was deemed in                            satisfactory condition to undergo the procedure.                           After obtaining informed consent, the endoscope was  passed under direct vision. Throughout the                            procedure, the patient's blood pressure, pulse, and                            oxygen saturations were monitored continuously. The                            Model GIF-HQ190 [id number]) scope was introduced                            through the mouth, and advanced to the second part                            of duodenum. The upper GI  endoscopy was                            accomplished without difficulty. The patient                            tolerated the procedure well. Scope In: Scope Out: Findings:                 One mild benign-appearing, intrinsic stenosis was                            found at the gastroesophageal junction (Schatzki's                            type ring). A TTS dilator was passed through the                            scope. Dilation with an 18-19-20 mm balloon dilator                            was performed to 20 mm.                           A single 4 mm mucosal papule (nodule) was found in                            the gastric antrum (appeared to be a site of                            previous, healing inflammation). . Biopsies were                            taken with a cold forceps for histology.                           The exam was otherwise without abnormality. Complications:            No immediate complications. Estimated blood loss:  None. Estimated Blood Loss:     Estimated blood loss: none. Impression:               - Benign-appearing esophageal stenosis. Dilated.                           - A single mucosal papule (nodule) found in the                            stomach. Biopsied.                           - The examination was otherwise normal. Recommendation:           - Patient has a contact number available for                            emergencies. The signs and symptoms of potential                            delayed complications were discussed with the                            patient. Return to normal activities tomorrow.                            Written discharge instructions were provided to the                            patient.                           - You need to eat 4-5 very small meals per day                            rather than one larger meal.                           - Continue present medications. You need to  get                            your blood sugars under better control. It is                            unlikely your nausea will improve otherwise.                           - Await pathology results. If H. pylori is noted,                            you will be started on appropriate antibiotics. Milus Banister, MD 01/25/2017 2:58:39 PM This report has been signed electronically.

## 2017-01-25 NOTE — Progress Notes (Signed)
HPI: Patient is a 56 year old female previously followed by Dr. Sharlett Iles for a history of IBS. She is referred by PCP, Dr Skip Estimable evaluation of nausea and early satiety. Patient was diagnosed with diabetes in 2010, initially treated with oral agents but on insulin for last 3 years. She was hospitalized in August for severe hyperglycemia  She has been nauseated since leaving the hospital and her weight is down 30 pounds. H.pylori is negative. Recent chem profile unremarkable. Doesn't recall any medication changes. Blood sugars at home averaaging around 300, she doesn't feel well if they are less than 200. She is not taking NSAIDS. PCP started her on prilosec yesterday but prior to that she was on OTC PPI. Patient concerned she may have gastroparesis. She has also been having intermittent solid food dysphagia.   Past Medical History:  Diagnosis Date  . Anxiety   . B12 deficiency   . GERD (gastroesophageal reflux disease)   . Hyperlipidemia   . Hypertension   . Hypothyroidism   . IBS (irritable bowel syndrome)   . Intermittent vertigo   . 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 Leg cramping    "@ night" (09/18/2013)  . Migraine    "once q couple months" (09/18/2013)  . Osteoporosis   . Peripheral neuropathy ([place])   . Type II diabetes mellitus ([place])   . Umbilical hernia    unrepaired (09/18/2013)     Past Surgical History:  Procedure Laterality Date  . CESAREAN SECTION  1989  . LAPAROSCOPIC ASSISTED VAGINAL HYSTERECTOMY  09/23/2012   Procedure: LAPAROSCOPIC ASSISTED VAGINAL HYSTERECTOMY;  Surgeon: Linda Hedges, DO;  Location: [facility];  Service: Gynecology;  Laterality: N/A;  pull Dr Gregor Hams instrument  . TUBAL LIGATION Bilateral 1992  . VAGINAL HYSTERECTOMY  2013   Family History  Problem Relation Age of Onset  . Diabetes Other   . Heart disease Other   . Heart failure Mother   . Diabetes Maternal Grandmother   . Alzheimer's disease  Maternal Grandmother   . Coronary artery disease Maternal Grandmother   . Heart attack Maternal Grandfather   . Heart failure Brother    Social History  Substance Use Topics  . Smoking status: Former Smoker    Years: 0.20    Types: Cigarettes    Quit date: 12/10/1990  . Smokeless tobacco: Never Used  . Alcohol use No   Current Outpatient Prescriptions  Medication Sig Dispense Refill  . atorvastatin (LIPITOR) 80 MG tablet Take 1 tablet (80 mg total) by mouth daily at 6 PM. 90 tablet 0  . cetirizine (ZYRTEC) 10 MG tablet Take 1 tablet (10 mg total) by mouth daily. 30 tablet 11  . dicyclomine (BENTYL) 10 MG capsule Take 1 capsule (10 mg total) by mouth 4 (four) times daily -  before meals and at bedtime. 40 capsule 1  . ezetimibe (ZETIA) 10 MG tablet Take 1 tablet (10 mg total) by mouth daily. 30 tablet 11  . fluticasone (FLONASE) 50 MCG/ACT nasal spray Place 2 sprays into both nostrils daily. 16 g 6  . glucose blood test strip Use 3x a day - One Touch Verio Flex meter 200 each 3  . Insulin Glargine (BASAGLAR KWIKPEN) 100 UNIT/ML SOPN INJECT 50 UNITS INTO THE SKIN DAILY AT 10 PM. 45 pen 1  . insulin lispro (HUMALOG) 100 UNIT/ML injection Inject 0.15-0.2 mLs (15-20 Units total) into the skin 3 (three) times daily before meals. 20 mL  5  . Insulin Pen Needle (CAREFINE PEN NEEDLES) 32G X 4 MM MISC Use 4x a day 200 each 11  . levothyroxine (SYNTHROID, LEVOTHROID) 200 MCG tablet TAKE 1 TABLET BY MOUTH DAILY TAKE ALONG WITH THE 12.5 MG DOSE. 60 tablet 1  . levothyroxine (SYNTHROID, LEVOTHROID) 25 MCG tablet Take 0.5 tablets (12.5 mcg total) by mouth daily before breakfast. In addition to 200 mcg daily for TDD 212.5 mcg 30 tablet 2  . metFORMIN (GLUCOPHAGE-XR) 500 MG 24 hr tablet Take 2 tablets (1,000 mg total) by mouth 2 (two) times daily after a meal. 360 tablet 3  . omeprazole (PRILOSEC) 40 MG capsule Take 1 capsule (40 mg total) by mouth daily. 30 capsule 1  . ondansetron (ZOFRAN) 4 MG tablet  TAKE 1 TABLET BY MOUTH EVERY 6 HOURS 30 tablet 0  . ondansetron (ZOFRAN-ODT) 8 MG disintegrating tablet Take 8 mg by mouth every 8 (eight) hours as needed for nausea or vomiting.    . ONE TOUCH LANCETS MISC Use 3x a day - One Touch Verio Flex meter 200 each 3  . RELPAX 40 MG tablet ONE TABLET BY MOUTH AT ONSET OF HEADACHE. MAY REPEAT IN 2 HOURS IF HEADACHE PERSISTS OR RECURS. 10 tablet 2   No current facility-administered medications for this visit.    Allergies  Allergen Reactions  . Codeine Hives, Anxiety and Other (See Comments)    REACTION: N \\T \ V and hyper. Pt can take Percocet.     Review of Systems: All systems reviewed and negative except where noted in HPI.    Physical Exam: BP 108/66   Pulse 60   Ht 4\' 11"  (1.499 m)   Wt 128 lb (58.1 kg)   LMP 08/11/2012   BMI 25.85 kg/m  Constitutional:  Well-developed, female in no acute distress. Psychiatric: Normal mood and affect. Behavior is normal. HEENT: Normocephalic and atraumatic. Conjunctivae are normal. No scleral icterus. Neck supple.  Cardiovascular: Normal rate, regular rhythm.  Pulmonary/chest: Effort normal and breath sounds normal. No wheezing, rales or rhonchi. Abdominal: Soft, nondistended, nontender. Bowel sounds active throughout. There are no masses palpable. Extremities: no edema Lymphadenopathy: No cervical adenopathy noted. Neurological: Alert and oriented to person place and time. Skin: Skin is warm and dry. No rashes noted.   ASSESSMENT AND PLAN: 1. 56 yo female with several month history of nausea associated with 20 pound weight loss. She has uncontrolled diabetes with recent hemoglobin a1c of 12. I suspect she has diabetic gastroparesis but will arrange for EGD to rule out other pathology. If EGD negative, consider trial of Reglan   2. Solid food dysphagia. Further evaluation at time off EGD. -The risks and benefits of EGD with possible dilation were discussed and the patient agrees to proceed.    3.  Colon cancer screening. Due for 10 year recall March 2019  Tye Savoy, NP  01/25/2017, 1:40 AM  Cc: Jinny Sanders, MD

## 2017-01-25 NOTE — Progress Notes (Signed)
I agree with the above note, plan 

## 2017-01-25 NOTE — Progress Notes (Signed)
Called to room to assist during endoscopic procedure.  Patient ID and intended procedure confirmed with present staff. Received instructions for my participation in the procedure from the performing physician.  

## 2017-01-28 ENCOUNTER — Telehealth: Payer: Self-pay | Admitting: *Deleted

## 2017-01-28 NOTE — Telephone Encounter (Signed)
  Follow up Call-  Call back number 01/25/2017  Post procedure Call Back phone  # [phone]  Permission to leave phone message Yes  Some recent data might be hidden     Patient questions:  Do you have a fever, pain , or abdominal swelling? No. Pain Score  0 *  Have you tolerated food without any problems? Yes.    Have you been able to return to your normal activities? Yes.    Do you have any questions about your discharge instructions: Diet   No. Medications  No. Follow up visit  No.  Do you have questions or concerns about your Care? No.  Actions: * If pain score is 4 or above: No action needed, pain <4.

## 2017-01-28 NOTE — Telephone Encounter (Signed)
  Follow up Call-  Call back number 01/25/2017  Post procedure Call Back phone  # [phone]  Permission to leave phone message Yes  Some recent data might be hidden     Patient questions:    Message left to call us if necessary.

## 2017-01-30 ENCOUNTER — Other Ambulatory Visit: Payer: Self-pay | Admitting: Internal Medicine

## 2017-02-01 ENCOUNTER — Other Ambulatory Visit: Payer: Self-pay

## 2017-02-01 ENCOUNTER — Encounter: Payer: Self-pay | Admitting: Gastroenterology

## 2017-02-01 MED ORDER — BIS SUBCIT-METRONID-TETRACYC 140-125-125 MG PO CAPS
3.0000 | ORAL_CAPSULE | Freq: Three times a day (TID) | ORAL | 0 refills | Status: DC
Start: 2017-02-01 — End: 2017-03-22

## 2017-02-05 ENCOUNTER — Encounter: Payer: Self-pay | Admitting: Family Medicine

## 2017-02-05 ENCOUNTER — Ambulatory Visit (INDEPENDENT_AMBULATORY_CARE_PROVIDER_SITE_OTHER): Payer: 59 | Admitting: Family Medicine

## 2017-02-05 VITALS — BP 90/60 | HR 93 | Temp 98.4°F | Ht 61.0 in | Wt 129.2 lb

## 2017-02-05 DIAGNOSIS — F411 Generalized anxiety disorder: Secondary | ICD-10-CM

## 2017-02-05 DIAGNOSIS — Z23 Encounter for immunization: Secondary | ICD-10-CM

## 2017-02-05 DIAGNOSIS — F5104 Psychophysiologic insomnia: Secondary | ICD-10-CM | POA: Insufficient documentation

## 2017-02-05 MED ORDER — ONDANSETRON HCL 4 MG PO TABS: 4.0000 mg | ORAL_TABLET | Freq: Four times a day (QID) | ORAL | 0 refills | Status: DC

## 2017-02-05 MED ORDER — SERTRALINE HCL 50 MG PO TABS: 50.0000 mg | ORAL_TABLET | Freq: Every day | ORAL | 3 refills | Status: DC

## 2017-02-05 NOTE — Progress Notes (Signed)
Pre visit review using our clinic review tool, if applicable. No additional management support is needed unless otherwise documented below in the visit note. 

## 2017-02-05 NOTE — Patient Instructions (Addendum)
Look into counseling, call if interested in referral.  Start sertraline at bedtime.  Call if sleep not improving for consideration of trial of tramadol.  Keep follow up as scheduled.

## 2017-02-05 NOTE — Assessment & Plan Note (Signed)
If not improving consider trial of trazodone.

## 2017-02-05 NOTE — Assessment & Plan Note (Addendum)
Start sertraline 50 mg daily at bedtime. She will consider counseling.

## 2017-02-05 NOTE — Progress Notes (Signed)
   Subjective:    Patient ID: [name] [name], female    DOB: 19-Feb-1961, 56 y.o.   MRN: [id number]  HPI  56 year old female with history of generalized anxiety, presents for follow up on mood. She has been more irritable and anxious in last 6 months.  Occ panic attacks.. But this is getting better.  She has trouble sleeping at night.Tylenol PM helps for few nights. Has tried brother's lunesta. Helps some.  troubel shutting off brain.  She has been under more stress lately. Father in law is sick. Stressed an frustrated about her own health.   She has had recent upper ENDO with dilation.  Has helped swallowing, GERD and nausea.  Positive for Hpylori.  She is currently being treated for this with Pylera. Using prilosec twice daily.     Review of Systems  Constitutional: Negative for fatigue and fever.  HENT: Negative for ear pain.   Eyes: Negative for pain.  Respiratory: Negative for chest tightness and shortness of breath.   Cardiovascular: Positive for chest pain. Negative for palpitations and leg swelling.  Gastrointestinal: Negative for abdominal pain.        Reflux and occ chest tightness  Genitourinary: Negative for dysuria.        Objective:   Physical Exam  Constitutional: Vital signs are normal. She appears well-developed and well-nourished. She is cooperative.  Non-toxic appearance. She does not appear ill. No distress.  HENT:  Head: Normocephalic.  Right Ear: Hearing, tympanic membrane, external ear and ear canal normal. Tympanic membrane is not erythematous, not retracted and not bulging.  Left Ear: Hearing, tympanic membrane, external ear and ear canal normal. Tympanic membrane is not erythematous, not retracted and not bulging.  Nose: No mucosal edema or rhinorrhea. Right sinus exhibits no maxillary sinus tenderness and no frontal sinus tenderness. Left sinus exhibits no maxillary sinus tenderness and no frontal sinus tenderness.  Mouth/Throat: Uvula is midline,  oropharynx is clear and moist and mucous membranes are normal.  Eyes: Conjunctivae, EOM and lids are normal. Pupils are equal, round, and reactive to light. Lids are everted and swept, no foreign bodies found.  Neck: Trachea normal and normal range of motion. Neck supple. Carotid bruit is not present. No thyroid mass and no thyromegaly present.  Cardiovascular: Normal rate, regular rhythm, S1 normal, S2 normal, normal heart sounds, intact distal pulses and normal pulses.  Exam reveals no gallop and no friction rub.   No murmur heard. Pulmonary/Chest: Effort normal and breath sounds normal. No tachypnea. No respiratory distress. She has no decreased breath sounds. She has no wheezes. She has no rhonchi. She has no rales.  Abdominal: Soft. Normal appearance and bowel sounds are normal. There is no tenderness.  Neurological: She is alert.  Skin: Skin is warm, dry and intact. No rash noted.  Psychiatric: Her behavior is normal. Judgment and thought content normal. Her mood appears anxious. Her speech is not rapid and/or pressured. Cognition and memory are normal. She does not exhibit a depressed mood.          Assessment & Plan:

## 2017-02-09 ENCOUNTER — Observation Stay (HOSPITAL_COMMUNITY)
Admission: EM | Admit: 2017-02-09 | Discharge: 2017-02-11 | Disposition: A | Discharge status: Home / Self Care | Payer: 59 | Attending: Internal Medicine | Admitting: Internal Medicine

## 2017-02-09 ENCOUNTER — Emergency Department (HOSPITAL_COMMUNITY): Payer: 59

## 2017-02-09 ENCOUNTER — Encounter (HOSPITAL_COMMUNITY): Payer: Self-pay | Admitting: Emergency Medicine

## 2017-02-09 ENCOUNTER — Observation Stay (HOSPITAL_COMMUNITY): Payer: 59

## 2017-02-09 DIAGNOSIS — E1122 Type 2 diabetes mellitus with diabetic chronic kidney disease: Secondary | ICD-10-CM | POA: Insufficient documentation

## 2017-02-09 DIAGNOSIS — F411 Generalized anxiety disorder: Secondary | ICD-10-CM | POA: Diagnosis not present

## 2017-02-09 DIAGNOSIS — I25110 Atherosclerotic heart disease of native coronary artery with unstable angina pectoris: Secondary | ICD-10-CM | POA: Insufficient documentation

## 2017-02-09 DIAGNOSIS — E1110 Type 2 diabetes mellitus with ketoacidosis without coma: Secondary | ICD-10-CM | POA: Diagnosis present

## 2017-02-09 DIAGNOSIS — E785 Hyperlipidemia, unspecified: Secondary | ICD-10-CM | POA: Diagnosis not present

## 2017-02-09 DIAGNOSIS — I129 Hypertensive chronic kidney disease with stage 1 through stage 4 chronic kidney disease, or unspecified chronic kidney disease: Secondary | ICD-10-CM | POA: Insufficient documentation

## 2017-02-09 DIAGNOSIS — Z885 Allergy status to narcotic agent status: Secondary | ICD-10-CM | POA: Insufficient documentation

## 2017-02-09 DIAGNOSIS — Z833 Family history of diabetes mellitus: Secondary | ICD-10-CM | POA: Insufficient documentation

## 2017-02-09 DIAGNOSIS — Z8249 Family history of ischemic heart disease and other diseases of the circulatory system: Secondary | ICD-10-CM | POA: Diagnosis not present

## 2017-02-09 DIAGNOSIS — Z7982 Long term (current) use of aspirin: Secondary | ICD-10-CM | POA: Diagnosis not present

## 2017-02-09 DIAGNOSIS — R112 Nausea with vomiting, unspecified: Secondary | ICD-10-CM

## 2017-02-09 DIAGNOSIS — E039 Hypothyroidism, unspecified: Secondary | ICD-10-CM | POA: Insufficient documentation

## 2017-02-09 DIAGNOSIS — E876 Hypokalemia: Secondary | ICD-10-CM | POA: Insufficient documentation

## 2017-02-09 DIAGNOSIS — Z79899 Other long term (current) drug therapy: Secondary | ICD-10-CM | POA: Diagnosis not present

## 2017-02-09 DIAGNOSIS — Z9114 Patient's other noncompliance with medication regimen: Secondary | ICD-10-CM | POA: Diagnosis not present

## 2017-02-09 DIAGNOSIS — B9681 Helicobacter pylori [H. pylori] as the cause of diseases classified elsewhere: Secondary | ICD-10-CM | POA: Insufficient documentation

## 2017-02-09 DIAGNOSIS — R079 Chest pain, unspecified: Secondary | ICD-10-CM | POA: Diagnosis present

## 2017-02-09 DIAGNOSIS — I214 Non-ST elevation (NSTEMI) myocardial infarction: Secondary | ICD-10-CM | POA: Diagnosis not present

## 2017-02-09 DIAGNOSIS — K589 Irritable bowel syndrome without diarrhea: Secondary | ICD-10-CM | POA: Diagnosis not present

## 2017-02-09 DIAGNOSIS — K219 Gastro-esophageal reflux disease without esophagitis: Secondary | ICD-10-CM | POA: Insufficient documentation

## 2017-02-09 DIAGNOSIS — Z794 Long term (current) use of insulin: Secondary | ICD-10-CM | POA: Insufficient documentation

## 2017-02-09 DIAGNOSIS — R739 Hyperglycemia, unspecified: Secondary | ICD-10-CM

## 2017-02-09 DIAGNOSIS — Z87891 Personal history of nicotine dependence: Secondary | ICD-10-CM | POA: Insufficient documentation

## 2017-02-09 DIAGNOSIS — M810 Age-related osteoporosis without current pathological fracture: Secondary | ICD-10-CM | POA: Insufficient documentation

## 2017-02-09 DIAGNOSIS — E1142 Type 2 diabetes mellitus with diabetic polyneuropathy: Secondary | ICD-10-CM | POA: Diagnosis not present

## 2017-02-09 DIAGNOSIS — R748 Abnormal levels of other serum enzymes: Secondary | ICD-10-CM

## 2017-02-09 DIAGNOSIS — A048 Other specified bacterial intestinal infections: Secondary | ICD-10-CM | POA: Diagnosis present

## 2017-02-09 DIAGNOSIS — N182 Chronic kidney disease, stage 2 (mild): Secondary | ICD-10-CM | POA: Insufficient documentation

## 2017-02-09 LAB — CBG MONITORING, ED
Glucose-Capillary: 350 mg/dL — ABNORMAL HIGH (ref 65–99)
Glucose-Capillary: 264 mg/dL — ABNORMAL HIGH (ref 65–99)
Glucose-Capillary: 272 mg/dL — ABNORMAL HIGH (ref 65–99)

## 2017-02-09 LAB — PHOSPHORUS: Phosphorus: 3.1 mg/dL (ref 2.5–4.6)

## 2017-02-09 LAB — URINALYSIS, ROUTINE W REFLEX MICROSCOPIC
BILIRUBIN URINE: NEGATIVE
Bacteria, UA: NONE SEEN
Hgb urine dipstick: NEGATIVE
KETONES UR: 20 mg/dL — AB
LEUKOCYTES UA: NEGATIVE
Nitrite: NEGATIVE
PH: 6 (ref 5.0–8.0)
PROTEIN: 30 mg/dL — AB
RBC / HPF: NONE SEEN RBC/hpf (ref 0–5)
Specific Gravity, Urine: 1.022 (ref 1.005–1.030)

## 2017-02-09 LAB — ECHOCARDIOGRAM COMPLETE
CHL CUP MV DEC (S): 151
CHL CUP STROKE VOLUME: 27 mL
E decel time: 151 msec
EERAT: 11.21
FS: 31 % (ref 28–44)
HEIGHTINCHES: 61 in
IV/PV OW: 1.03
LA diam index: 2.25 cm/m2
LA vol A4C: 38.7 ml
LASIZE: 36 mm
LAVOL: 40.6 mL
LAVOLIN: 25.4 mL/m2
LEFT ATRIUM END SYS DIAM: 36 mm
LV E/e' medial: 11.21
LV PW d: 7.55 mm — AB (ref 0.6–1.1)
LV SIMPSON'S DISK: 39
LV TDI E'MEDIAL: 6.75
LV dias vol index: 44 mL/m2
LV dias vol: 70 mL (ref 46–106)
LV sys vol index: 27 mL/m2
LV sys vol: 43 mL — AB (ref 14–42)
LVEEAVG: 11.21
LVELAT: 10.7 cm/s
LVOT VTI: 17.1 cm
LVOT area: 2.54 cm2
LVOT diameter: 18 mm
LVOTPV: 90.9 cm/s
LVOTSV: 43 mL
Lateral S' vel: 10.3 cm/s
MV pk A vel: 59.2 m/s
MVPG: 6 mmHg
MVPKEVEL: 120 m/s
RV TAPSE: 18.7 mm
TDI e' lateral: 10.7
WEIGHTICAEL: 2064 [oz_av]

## 2017-02-09 LAB — I-STAT TROPONIN, ED
TROPONIN I, POC: 0.03 ng/mL (ref 0.00–0.08)
TROPONIN I, POC: 0.2 ng/mL — AB (ref 0.00–0.08)

## 2017-02-09 LAB — I-STAT VENOUS BLOOD GAS, ED
Acid-base deficit: 4 mmol/L — ABNORMAL HIGH (ref 0.0–2.0)
BICARBONATE: 21.7 mmol/L (ref 20.0–28.0)
O2 Saturation: 28 %
TCO2: 23 mmol/L (ref 0–100)
pCO2, Ven: 41.5 mmHg — ABNORMAL LOW (ref 44.0–60.0)
pH, Ven: 7.325 (ref 7.250–7.430)
pO2, Ven: 20 mmHg — CL (ref 32.0–45.0)

## 2017-02-09 LAB — BASIC METABOLIC PANEL
ANION GAP: 12 (ref 5–15)
ANION GAP: 12 (ref 5–15)
Anion gap: 17 — ABNORMAL HIGH (ref 5–15)
BUN: 13 mg/dL (ref 6–20)
BUN: 11 mg/dL (ref 6–20)
BUN: 10 mg/dL (ref 6–20)
CALCIUM: 9.6 mg/dL (ref 8.9–10.3)
CHLORIDE: 103 mmol/L (ref 101–111)
CHLORIDE: 105 mmol/L (ref 101–111)
CO2: 20 mmol/L — ABNORMAL LOW (ref 22–32)
CO2: 22 mmol/L (ref 22–32)
CO2: 20 mmol/L — AB (ref 22–32)
CREATININE: 0.67 mg/dL (ref 0.44–1.00)
Calcium: 9.1 mg/dL (ref 8.9–10.3)
Calcium: 8.9 mg/dL (ref 8.9–10.3)
Chloride: 96 mmol/L — ABNORMAL LOW (ref 101–111)
Creatinine, Ser: 0.76 mg/dL (ref 0.44–1.00)
Creatinine, Ser: 0.79 mg/dL (ref 0.44–1.00)
GFR calc Af Amer: >60 mL/min (ref >60)
GFR calc Af Amer: >60 mL/min (ref >60)
GFR calc non Af Amer: >60 mL/min (ref >60)
GFR calc non Af Amer: >60 mL/min (ref >60)
GLUCOSE: 414 mg/dL — AB (ref 65–99)
GLUCOSE: 285 mg/dL — AB (ref 65–99)
GLUCOSE: 152 mg/dL — AB (ref 65–99)
POTASSIUM: 3.5 mmol/L (ref 3.5–5.1)
Potassium: 3.8 mmol/L (ref 3.5–5.1)
Potassium: 3.1 mmol/L — ABNORMAL LOW (ref 3.5–5.1)
SODIUM: 133 mmol/L — AB (ref 135–145)
Sodium: 137 mmol/L (ref 135–145)
Sodium: 137 mmol/L (ref 135–145)

## 2017-02-09 LAB — CBC
HCT: 45.8 % (ref 36.0–46.0)
Hemoglobin: 16.3 g/dL — ABNORMAL HIGH (ref 12.0–15.0)
MCH: 29.7 pg (ref 26.0–34.0)
MCHC: 35.6 g/dL (ref 30.0–36.0)
MCV: 83.6 fL (ref 78.0–100.0)
Platelets: 371 10*3/uL (ref 150–400)
RBC: 5.48 MIL/uL — ABNORMAL HIGH (ref 3.87–5.11)
RDW: 13.7 % (ref 11.5–15.5)
WBC: 19.9 10*3/uL — AB (ref 4.0–10.5)

## 2017-02-09 LAB — HEPATIC FUNCTION PANEL
ALBUMIN: 4.4 g/dL (ref 3.5–5.0)
ALK PHOS: 63 U/L (ref 38–126)
ALT: 12 U/L — AB (ref 14–54)
AST: 18 U/L (ref 15–41)
Bilirubin, Direct: 0.1 mg/dL (ref 0.1–0.5)
Indirect Bilirubin: 0.5 mg/dL (ref 0.3–0.9)
TOTAL PROTEIN: 7.5 g/dL (ref 6.5–8.1)
Total Bilirubin: 0.6 mg/dL (ref 0.3–1.2)

## 2017-02-09 LAB — GLUCOSE, CAPILLARY
GLUCOSE-CAPILLARY: 134 mg/dL — AB (ref 65–99)
Glucose-Capillary: 164 mg/dL — ABNORMAL HIGH (ref 65–99)
Glucose-Capillary: 137 mg/dL — ABNORMAL HIGH (ref 65–99)
Glucose-Capillary: 147 mg/dL — ABNORMAL HIGH (ref 65–99)

## 2017-02-09 LAB — POC OCCULT BLOOD, ED: Fecal Occult Bld: NEGATIVE

## 2017-02-09 LAB — MAGNESIUM: Magnesium: 1.4 mg/dL — ABNORMAL LOW (ref 1.7–2.4)

## 2017-02-09 LAB — TROPONIN I
TROPONIN I: 0.31 ng/mL — AB (ref <0.03)
Troponin I: 0.2 ng/mL (ref <0.03)
Troponin I: 0.23 ng/mL (ref <0.03)

## 2017-02-09 LAB — LIPASE, BLOOD: Lipase: 19 U/L (ref 11–51)

## 2017-02-09 MED ORDER — ATORVASTATIN CALCIUM 80 MG PO TABS
80.0000 mg | ORAL_TABLET | Freq: Every day | ORAL | Status: DC
  Administered 2017-02-09 – 2017-02-11 (×3): 80 mg via ORAL
  Filled 2017-02-09 (×3): qty 1

## 2017-02-09 MED ORDER — NITROGLYCERIN 2 % TD OINT
0.5000 [in_us] | TOPICAL_OINTMENT | Freq: Four times a day (QID) | TRANSDERMAL | Status: DC
  Administered 2017-02-10 – 2017-02-11 (×6): 0.5 [in_us] via TOPICAL
  Filled 2017-02-09: qty 30

## 2017-02-09 MED ORDER — LACTATED RINGERS IV BOLUS (SEPSIS)
1000.0000 mL | Freq: Once | INTRAVENOUS | Status: AC
  Administered 2017-02-09 (×2): 1000 mL via INTRAVENOUS

## 2017-02-09 MED ORDER — SODIUM CHLORIDE 0.9 % IV SOLN
INTRAVENOUS | Status: DC
  Administered 2017-02-09 – 2017-02-11 (×3): via INTRAVENOUS

## 2017-02-09 MED ORDER — FLUTICASONE PROPIONATE 50 MCG/ACT NA SUSP: 2.0000 | Freq: Every day | NASAL | Status: DC

## 2017-02-09 MED ORDER — INSULIN ASPART 100 UNIT/ML ~~LOC~~ SOLN
0.0000 [IU] | Freq: Three times a day (TID) | SUBCUTANEOUS | Status: DC
  Administered 2017-02-09: 5 [IU] via SUBCUTANEOUS
  Administered 2017-02-09: 2 [IU] via SUBCUTANEOUS
  Filled 2017-02-09: qty 1

## 2017-02-09 MED ORDER — DEXTROSE-NACL 5-0.45 % IV SOLN: INTRAVENOUS | Status: DC

## 2017-02-09 MED ORDER — PANTOPRAZOLE SODIUM 40 MG IV SOLR
40.0000 mg | INTRAVENOUS | Status: DC
Start: 2017-02-09 — End: 2017-02-11
  Administered 2017-02-09 – 2017-02-10 (×2): 40 mg via INTRAVENOUS
  Filled 2017-02-09 (×2): qty 40

## 2017-02-09 MED ORDER — TETRACYCLINE HCL 250 MG PO CAPS
500.0000 mg | ORAL_CAPSULE | Freq: Three times a day (TID) | ORAL | Status: DC
  Filled 2017-02-09 (×2): qty 2

## 2017-02-09 MED ORDER — SODIUM CHLORIDE 0.9 % IV SOLN: INTRAVENOUS | Status: DC

## 2017-02-09 MED ORDER — BISMUTH SUBSALICYLATE 262 MG PO CHEW
524.0000 mg | CHEWABLE_TABLET | Freq: Three times a day (TID) | ORAL | Status: DC
  Administered 2017-02-09: 524 mg via ORAL
  Filled 2017-02-09 (×4): qty 2

## 2017-02-09 MED ORDER — ONDANSETRON HCL 4 MG/2ML IJ SOLN
4.0000 mg | Freq: Four times a day (QID) | INTRAMUSCULAR | Status: DC | PRN
  Administered 2017-02-09 – 2017-02-11 (×3): 4 mg via INTRAVENOUS
  Filled 2017-02-09 (×3): qty 2

## 2017-02-09 MED ORDER — LEVOTHYROXINE SODIUM 25 MCG PO TABS
12.5000 ug | ORAL_TABLET | Freq: Every day | ORAL | Status: DC
  Administered 2017-02-10: 12.5 ug via ORAL
  Filled 2017-02-09: qty 1

## 2017-02-09 MED ORDER — POTASSIUM CHLORIDE CRYS ER 20 MEQ PO TBCR
20.0000 meq | EXTENDED_RELEASE_TABLET | Freq: Two times a day (BID) | ORAL | Status: AC
  Administered 2017-02-09 (×2): 20 meq via ORAL
  Filled 2017-02-09 (×2): qty 1

## 2017-02-09 MED ORDER — SERTRALINE HCL 50 MG PO TABS
50.0000 mg | ORAL_TABLET | Freq: Every day | ORAL | Status: DC
  Administered 2017-02-09 – 2017-02-11 (×3): 50 mg via ORAL
  Filled 2017-02-09 (×3): qty 1

## 2017-02-09 MED ORDER — INSULIN ASPART 100 UNIT/ML ~~LOC~~ SOLN: 8.0000 [IU] | Freq: Three times a day (TID) | SUBCUTANEOUS | Status: DC

## 2017-02-09 MED ORDER — SODIUM CHLORIDE 0.9 % IV BOLUS (SEPSIS): 1000.0000 mL | Freq: Once | INTRAVENOUS | Status: DC

## 2017-02-09 MED ORDER — INSULIN GLARGINE 100 UNIT/ML ~~LOC~~ SOLN
50.0000 [IU] | Freq: Every day | SUBCUTANEOUS | Status: DC
  Administered 2017-02-09 – 2017-02-10 (×2): 50 [IU] via SUBCUTANEOUS
  Filled 2017-02-09 (×3): qty 0.5

## 2017-02-09 MED ORDER — PANTOPRAZOLE SODIUM 40 MG PO TBEC: 40.0000 mg | DELAYED_RELEASE_TABLET | Freq: Every day | ORAL | Status: DC

## 2017-02-09 MED ORDER — SODIUM CHLORIDE 0.9 % IV SOLN: Freq: Once | INTRAVENOUS | Status: DC

## 2017-02-09 MED ORDER — NITROGLYCERIN 0.4 MG SL SUBL
0.4000 mg | SUBLINGUAL_TABLET | SUBLINGUAL | Status: DC | PRN
  Administered 2017-02-09: 0.4 mg via SUBLINGUAL
  Filled 2017-02-09: qty 1

## 2017-02-09 MED ORDER — SODIUM CHLORIDE 0.9 % IV SOLN
INTRAVENOUS | Status: DC
  Filled 2017-02-09: qty 2.5

## 2017-02-09 MED ORDER — LACTATED RINGERS IV BOLUS (SEPSIS)
1000.0000 mL | Freq: Once | INTRAVENOUS | Status: AC
  Administered 2017-02-09: 1000 mL via INTRAVENOUS

## 2017-02-09 MED ORDER — METOCLOPRAMIDE HCL 5 MG/ML IJ SOLN
10.0000 mg | Freq: Once | INTRAMUSCULAR | Status: AC
  Administered 2017-02-09: 10 mg via INTRAVENOUS
  Filled 2017-02-09: qty 2

## 2017-02-09 MED ORDER — INSULIN ASPART 100 UNIT/ML ~~LOC~~ SOLN: 0.0000 [IU] | Freq: Every day | SUBCUTANEOUS | Status: DC

## 2017-02-09 MED ORDER — DOXYCYCLINE HYCLATE 100 MG PO TABS: 100.0000 mg | ORAL_TABLET | Freq: Two times a day (BID) | ORAL | Status: DC

## 2017-02-09 MED ORDER — HEPARIN (PORCINE) IN NACL 100-0.45 UNIT/ML-% IJ SOLN
750.0000 [IU]/h | INTRAMUSCULAR | Status: DC
  Administered 2017-02-09: 650 [IU]/h via INTRAVENOUS
  Administered 2017-02-11: 750 [IU]/h via INTRAVENOUS
  Filled 2017-02-09 (×2): qty 250

## 2017-02-09 MED ORDER — HEPARIN BOLUS VIA INFUSION
3500.0000 [IU] | Freq: Once | INTRAVENOUS | Status: AC
  Administered 2017-02-09: 3500 [IU] via INTRAVENOUS
  Filled 2017-02-09: qty 3500

## 2017-02-09 MED ORDER — METRONIDAZOLE 250 MG PO TABS
375.0000 mg | ORAL_TABLET | Freq: Three times a day (TID) | ORAL | Status: DC
  Administered 2017-02-09: 375 mg via ORAL
  Filled 2017-02-09 (×4): qty 1.5

## 2017-02-09 MED ORDER — ENOXAPARIN SODIUM 40 MG/0.4ML ~~LOC~~ SOLN
40.0000 mg | SUBCUTANEOUS | Status: DC
Start: 2017-02-09 — End: 2017-02-09

## 2017-02-09 MED ORDER — EZETIMIBE 10 MG PO TABS
10.0000 mg | ORAL_TABLET | Freq: Every day | ORAL | Status: DC
  Administered 2017-02-09 – 2017-02-11 (×3): 10 mg via ORAL
  Filled 2017-02-09 (×3): qty 1

## 2017-02-09 MED ORDER — INSULIN ASPART 100 UNIT/ML ~~LOC~~ SOLN
10.0000 [IU] | Freq: Once | SUBCUTANEOUS | Status: AC
  Administered 2017-02-09: 10 [IU] via INTRAVENOUS
  Filled 2017-02-09: qty 1

## 2017-02-09 MED ORDER — MAGNESIUM SULFATE 2 GM/50ML IV SOLN
2.0000 g | Freq: Once | INTRAVENOUS | Status: AC
  Administered 2017-02-09: 2 g via INTRAVENOUS
  Filled 2017-02-09: qty 50

## 2017-02-09 MED ORDER — BIS SUBCIT-METRONID-TETRACYC 140-125-125 MG PO CAPS: 3.0000 | ORAL_CAPSULE | Freq: Three times a day (TID) | ORAL | Status: DC

## 2017-02-09 MED ORDER — MORPHINE SULFATE (PF) 2 MG/ML IV SOLN
2.0000 mg | Freq: Once | INTRAVENOUS | Status: AC
  Administered 2017-02-09: 2 mg via INTRAVENOUS
  Filled 2017-02-09: qty 1

## 2017-02-09 MED ORDER — DICYCLOMINE HCL 10 MG PO CAPS: 10.0000 mg | ORAL_CAPSULE | Freq: Three times a day (TID) | ORAL | Status: DC

## 2017-02-09 NOTE — Progress Notes (Signed)
Report called to [facility]. Joslyn Hy, MSN, RN, [employer]

## 2017-02-09 NOTE — Consult Note (Signed)
Cardiology Consult Note  Admit date: 02/09/2017 Name: [name] [name] 56 y.o.  female DOB:  [birth year] MRN:  [id number]  Today's date:  02/09/2017  Referring Physician:    [facility]   Primary Physician:    Dr. Renne Crigler  Reason for Consultation:  Chest pain and abnormal troponin   IMPRESSIONS: 1. Chest discomfort with some typical other atypical features some suggest angina or other recurs in the setting of diabetic ketoacidosis 2. Diabetes mellitus poorly controlled 3. Hyperlipidemia 4. History of esophageal stricture reflux as well as H. Pylori 5. Isolated elevation of troponin unclear whether due to DKA or could be demand ischemia or acute coronary syndrome  RECOMMENDATION: 1. Cycle troponins to trend whether they're flat or increasing. Repeat EKG has isolated T-wave inversion in V2 2. Continue to treat DKA 3. Intravenous heparin for now to cover 4. Depending on troponin elevations and clinical course decide whether she needs a stress test or repeat catheterization.  HISTORY: This 56 year old female has a ten-year history of diabetes mellitus. She had an abnormal stress test in 2012 that lead to a catheterization that showed no obstructive coronary artery disease on 02/20/2011. She has had several admissions for DKA. She presented to the emergency room in February 2016 for migraine and was found to be in atrial fibrillation which converted to sinus rhythm. She had restarted her thyroid medicines but was overactive at the time she was seen with thyroid replacement and her thyroid medicines were adjusted.. She had noticed exertional type symptoms and she wore a 21 day monitor that did not show atrial fibrillation and was taken off of anticoagulation. She was supposed to have a treadmill but never returned for it.  She has noted what she describes exertional substernal pressure like someone sitting on her chest when she exercises and is relieved with rest. It is usually brought on with  activity. She developed abdominal pain as well as pressure type pain and presented to the emergency room where she was found to be in DKA. An EKG showed isolated T-wave inversion in V2 and a troponin returned positive at 0.2. She is currently not having chest discomfort.   Diagnosis Date  . Anxiety   . B12 deficiency   . GERD (gastroesophageal reflux disease)   . Hyperlipidemia   . Hypertension    [name]  . Hypothyroidism   . IBS (irritable bowel syndrome)   . Intermittent vertigo   . 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 Leg cramping    "@ night" (09/18/2013)  . Migraine    "once q couple months" (09/18/2013)  . Osteoporosis   . Peripheral neuropathy ([place])   . Type II diabetes mellitus ([place])   . Umbilical hernia    unrepaired (09/18/2013)      Past Surgical History:  Procedure Laterality Date  . CESAREAN SECTION  1989  . LAPAROSCOPIC ASSISTED VAGINAL HYSTERECTOMY  09/23/2012   Procedure: LAPAROSCOPIC ASSISTED VAGINAL HYSTERECTOMY;  Surgeon: Linda Hedges, DO;  Location: [facility];  Service: Gynecology;  Laterality: N/A;  pull Dr Gregor Hams instrument  . TUBAL LIGATION Bilateral 1992  . VAGINAL HYSTERECTOMY  2013   Allergies:  is allergic to codeine.   Medications: Prior to Admission medications   Medication Sig Start Date End Date Taking? Authorizing Provider  atorvastatin (LIPITOR) 80 MG tablet Take 1 tablet (80 mg total) by mouth daily at 6 PM. 09/04/16  Yes Amy E Diona Browner, MD  bismuth-metronidazole-tetracycline (PYLERA) 140-125-125 MG capsule Take 3 capsules by  mouth 4 (four) times daily -  before meals and at bedtime. 02/01/17 02/15/17 Yes Milus Banister, MD  ezetimibe (ZETIA) 10 MG tablet Take 1 tablet (10 mg total) by mouth daily. 09/11/16  Yes Amy Cletis Athens, MD  HUMALOG KWIKPEN 100 UNIT/ML KiwkPen INJECT10-12 UNITS INTO THE SKIN 3 (THREE) TIMES DAILY. 01/15/17  Yes Historical Provider, MD  Insulin Glargine (BASAGLAR KWIKPEN) 100 UNIT/ML SOPN INJECT 50  UNITS INTO THE SKIN DAILY AT 10 PM. 12/20/16  Yes Philemon Kingdom, MD  levothyroxine (SYNTHROID, LEVOTHROID) 200 MCG tablet TAKE 1 TABLET BY MOUTH DAILY TAKE ALONG WITH THE 12.5 MG DOSE. FOR TOTAL OF 212.5MCG 01/30/17  Yes Philemon Kingdom, MD  levothyroxine (SYNTHROID, LEVOTHROID) 25 MCG tablet Take 0.5 tablets (12.5 mcg total) by mouth daily before breakfast. In addition to 200 mcg daily for TDD 212.5 mcg 07/17/16  Yes Philemon Kingdom, MD  metFORMIN (GLUCOPHAGE-XR) 500 MG 24 hr tablet Take 2 tablets (1,000 mg total) by mouth 2 (two) times daily after a meal. 09/07/16  Yes Philemon Kingdom, MD  omeprazole (PRILOSEC) 40 MG capsule Take 1 capsule (40 mg total) by mouth daily. Patient taking differently: Take 40 mg by mouth 2 (two) times daily.  01/22/17  Yes Amy E Bedsole, MD  RELPAX 40 MG tablet ONE TABLET BY MOUTH AT ONSET OF HEADACHE. MAY REPEAT IN 2 HOURS IF HEADACHE PERSISTS OR RECURS. 10/17/16  Yes Amy Cletis Athens, MD  sertraline (ZOLOFT) 50 MG tablet Take 1 tablet (50 mg total) by mouth daily. Patient taking differently: Take 50 mg by mouth at bedtime.  02/05/17  Yes Amy Cletis Athens, MD   Family History: Family Status  Relation Status  . Other Alive  . Other Alive  . Mother   . Maternal Grandmother   . Maternal Grandfather   . Brother    Social History:   reports that she quit smoking about 26 years ago. Her smoking use included Cigarettes. She quit after 0.20 years of use. She has never used smokeless tobacco. She reports that she does not drink alcohol or use drugs.   Social History   Social History Narrative   Regular exercise- no    Diet- lots of mountain dew, limits fast foods     Review of Systems: She has issues with generalized anxiety. Poorly controlled diabetes, some neuropathy of feet. Complains that she is said her stomach stretched in the past and also that she has felt poorly since being diagnosed with H. pylori.  Other than as noted above remainder of the review of systems  is unremarkable.  Physical Exam: BP 98/74   Pulse 101   Temp 97.8 F (36.6 C) (Oral)   Resp 22   Ht 5\' 1"  (1.549 m)   Wt 58.5 kg (129 lb)   LMP 08/11/2012   SpO2 96%   BMI 24.37 kg/m   General appearance: She is a small framed white female in no acute distress Head: Normocephalic, without obvious abnormality, atraumatic Eyes: conjunctivae/corneas clear. PERRL, EOM's intact. Fundi not examined Neck: no adenopathy, no carotid bruit, no JVD and supple, symmetrical, trachea midline Lungs: clear to auscultation bilaterally Heart: regular rate and rhythm, S1, S2 normal, no murmur, click, rub or gallop Abdomen: soft, non-tender; bowel sounds normal; no masses,  no organomegaly Pelvic: deferred Extremities: extremities normal, atraumatic, no cyanosis or edema Pulses: 2+ and symmetric Skin: Skin color, texture, turgor normal. No rashes or lesions Neurologic: Grossly normal  Psych: Alert and oriented x 3 Labs: CBC  Recent  Labs  02/09/17 0625  WBC 19.9*  RBC 5.48*  HGB 16.3*  HCT 45.8  PLT 371  MCV 83.6  MCH 29.7  MCHC 35.6  RDW 13.7   CMP   Recent Labs  02/09/17 0738 02/09/17 0958  NA  --  137  K  --  3.5  CL  --  103  CO2  --  22  GLUCOSE  --  285*  BUN  --  11  CREATININE  --  0.79  CALCIUM  --  9.1  PROT 7.5  --   ALBUMIN 4.4  --   AST 18  --   ALT 12*  --   ALKPHOS 63  --   BILITOT 0.6  --   GFRNONAA  --  >60  GFRAA  --  >60   Cardiac Panel (last 3 results) Troponin (Point of Care Test)  Recent Labs  02/09/17 1010  TROPIPOC 0.20*   Cardiac Panel (last 3 results)  Recent Labs  02/09/17 0958 02/09/17 1510  TROPONINI 0.20* 0.31*     Radiology:  No evidence of acute cardiopulmonary disease   EKG: Normal sinus rhythm, isolated T-wave inversion in V2 otherwise no ST changes  Independently reviewed by me  Signed:  W. Doristine Church MD [facility]   Cardiology Consultant  02/09/2017, 4:05 PM

## 2017-02-09 NOTE — ED Notes (Signed)
Lab called, critical Troponin 0.31.

## 2017-02-09 NOTE — ED Notes (Signed)
Report attempted 

## 2017-02-09 NOTE — Progress Notes (Signed)
Patient evaluated for active chest pain with nausea, vomiting, and diaphoresis.  Pain 6 out of 10 in intensity.  Pain was 8 out of 10 upon arrival.  She had an adverse reaction to first SL nitroglycerin and refused additional doses.  STAT EKG reviewed by me.  No acute ST segment changes.  Nonspecific T wave flattening in diffuse leads.  Repeat troponin pending.  Husband is concerned that blood sugar is too low at 137.  Husband advised that this blood sugar level unlikely to be a contributing factor at this point, even if the patient's sugars "are typically in the 200's".  Husband also advised that patient recent started treatment for H pylori infection.  The oral antibiotics (flagyl, doxycycline) could certainly be contributing to nausea and vomiting at this point.  Will HOLD H. Pylori treatment for now.  Therapy can be resumed when patient is not acutely ill.  Will give IV protonix for now.  For active chest pain, ASA 324mg  given by EMS earlier today.  Cardiology has seen earlier today.  She is already on a heparin drip.  She is already on supplemental oxygen.  Will advance anti-anginal therapy with nitro paste 1/2 inch, give IV morphine 2 mg IV, and zofran for nausea.  If blood pressure holds up, can also add low dose BB.  Will call cardiology if troponin has doubled.  If we cannot get her chest pain free, she may not be a candidate for stress test in the AM.

## 2017-02-09 NOTE — ED Notes (Signed)
Elevated troponin value reported to Erin Hearing, NP.

## 2017-02-09 NOTE — ED Notes (Signed)
Dr. Aggie Moats at bedside, aware of run VT.  Pt had no c/o chest pain, shortness of breath, dizziness, or nausea during this time.

## 2017-02-09 NOTE — Progress Notes (Addendum)
Inpatient Diabetes Program Recommendations  AACE/ADA: New Consensus Statement on Inpatient Glycemic Control (2015)  Target Ranges:  Prepandial:   less than 140 mg/dL      Peak postprandial:   less than 180 mg/dL (1-2 hours)      Critically ill patients:  140 - 180 mg/dL   Results for [name] [name], [name] [name] (MRN [id number]) as of 02/09/2017 10:19  Ref. Range 02/09/2017 06:25  Sodium Latest Ref Range: 135 - 145 mmol/L 133 (L)  Potassium Latest Ref Range: 3.5 - 5.1 mmol/L 3.8  Chloride Latest Ref Range: 101 - 111 mmol/L 96 (L)  CO2 Latest Ref Range: 22 - 32 mmol/L 20 (L)  Glucose Latest Ref Range: 65 - 99 mg/dL 414 (H)  BUN Latest Ref Range: 6 - 20 mg/dL 13  Creatinine Latest Ref Range: 0.44 - 1.00 mg/dL 0.76  Calcium Latest Ref Range: 8.9 - 10.3 mg/dL 9.6  Anion gap Latest Ref Range: 5 - 15  17 (H)    Admit with: CP/ Hyperglycemia  History: DM  Home DM Meds: Metformin ER 1000 mg BID       Basaglar (insulin glargine) 50 units QHS       Humalog 15 units with small meal       Humalog 20 units with a large meal  Current Insulin Orders: IV Insulin drip     -Patient to start IV insulin therapy this AM.  -Per record review, note that patient saw her Endocrinologist Dr. Henrine Screws with [facility] on 09/07/16.  At that visit, pt was instructed to increase her diabetes medications to the following regimen: Metformin ER 1000 mg BID Basaglar (insulin glargine) 50 units QHS Humalog 15 units with small meal Humalog 20 units with a large meal  -Patient also met with the RD for Dr. Cruzita Lederer on 08/23/16 for further 1:1 diabetes diet counseling.  -DM Coordinator not physically on campus over the weekend.  Will attempt to call pt to discuss her non-adherence to her medication regimen once she gets a room assignment.  -Of note, patient was counseled by the DM Coordinator about the importance of good glucose control back in August 2017.    MD- Once pt is ready to transition to SQ  insulin from the IV insulin drip, please consider the following transition insulin regimen:  1. Start Lantus 50 units daily (make sure to give 1st dose at least 1 hour before IV insulin drip stopped)  2. Start Novolog Sensitive Correction Scale/ SSI (0-9 units) TID AC + HS  3. Start Novolog Meal Coverage: Novolog 8 units TID with meals (hold if pt eats <50% of meal)  This would be about 50% of her home dose of rapid-acting insulin to start     --Will follow patient during hospitalization--  Wyn Quaker RN, MSN, CDE Diabetes Coordinator Inpatient Glycemic Control Team Team Pager: [phone] (8a-5p)

## 2017-02-09 NOTE — ED Notes (Signed)
Walked patient to the bathroom pt got a urine sample

## 2017-02-09 NOTE — Progress Notes (Signed)
Patient complaining of 7/10 chest pain/pressure.  1 SL nitro given.  Patient stated chest pain was slightly improved to a 5/10.  Patient began sweating, having nausea and vomiting.  Patient subsequently refused any further SL nitro.

## 2017-02-09 NOTE — ED Notes (Signed)
Pt very anxious & agitated. Husband continuously at nurses station... Explained that EDP would be in shortly

## 2017-02-09 NOTE — Progress Notes (Signed)
Patient arrived to 2W room 4.  Telemetry monitor applied and CCMD notified.  Patient oriented to unit and room to include call light and phone.  Will continue to monitor.

## 2017-02-09 NOTE — ED Triage Notes (Signed)
Per EMS, pt woke up with abdominal pain about 0515 this am, pt states she was clammy and began to experience burning substernal chest pain with no radiation. Pt endorses nausea, given 4mg  zofran and 324 aspirin PTA. BP-113/88, HR-80, RR-18, SpO2-98%room air.

## 2017-02-09 NOTE — Progress Notes (Signed)
  Echocardiogram 2D Echocardiogram has been performed.  [name] [name] 02/09/2017, 5:49 PM

## 2017-02-09 NOTE — ED Notes (Signed)
Pt declined meal at this time.

## 2017-02-09 NOTE — H&P (Signed)
History and Physical    [name] [name] UXL:[id number] DOB: [birth year] DOA: 02/09/2017   PCP: Eliezer Lofts, MD   Patient coming from/Resides with: Private residence/husband  Admission status: Observation/Telemetry-it may be medically necessary to stay a minimum 2 midnights to rule out impending and/or unexpected changes in physiologic status that may differ from initial evaluation performed in the ER and/or at time of admission therefore consider reevaluation of admission status in 24 hours.   Chief Complaint: Abdominal/substernal chest pain with nausea and vomiting  HPI: [name] [name] is a 56 y.o. female with medical history significant for diabetes on insulin, hypothyroidism, GERD with recent diagnosis H. pylori infection, tobacco abuse, dyslipidemia and new dx of anxiety disorder recently started on Zofran. Patient reports that she awakened this morning with abdominal pain that later developed into burning substernal chest pain radiated up into the esophagus similar to reflux. She became cold and clammy and had nausea and vomiting of "foamy" substance. She has not had any fevers or diarrhea. She has had subjective chills. She's had progressive malaise and has not felt well since being diagnosed with H. pylori. She felt a severe headache yesterday. She has not had chest pain either at rest or with exertion, shortness of breath, dyspnea on exertion outside of today's symptoms.  ED Course:  Vital Signs: BP 94/65   Pulse 93   Temp 97.8 F (36.6 C) (Oral)   Resp 14   Ht '5\' 1"'  (1.549 m)   Wt 58.5 kg (129 lb)   LMP 08/11/2012   SpO2 97%   BMI 24.37 kg/m  AAS: No acute findings Lab data: Sodium 133, potassium 3.8, chloride 96, CO2 20, glucose 414, BUN 13, creatinine 0.76, anion gap 17, LFTs not elevated, poc troponin 0.03, white count 19,900 differential not obtained, hemoglobin 16.3, platelets 371,000, urinalysis unremarkable except for greater than 500 glucose, 20 ketones, 30 protein, FOB  negative Medications and treatments: 2 L NS boluses, insulin infusion  Review of Systems:  In addition to the HPI above,  No Fever No changes with Vision or hearing, new weakness, tingling, numbness in any extremity, dizziness, dysarthria or word finding difficulty, gait disturbance or imbalance, tremors or seizure activity No problems swallowing food or Liquids, indigestion/reflux, choking or coughing while eating, abdominal pain with or after eating No Cough or Shortness of Breath, palpitations, orthopnea or DOE No melena,hematochezia, dark tarry stools, constipation No dysuria, malodorous urine, hematuria or flank pain No new skin rashes, lesions, masses or bruises, No new joint pains, aches, swelling or redness No recent unintentional weight gain or loss No polyuria, polydypsia or polyphagia   Past Medical History:  Diagnosis Date  . Anxiety   . B12 deficiency   . GERD (gastroesophageal reflux disease)   . Hyperlipidemia   . Hypertension    [name]  . Hypothyroidism   . IBS (irritable bowel syndrome)   . Intermittent vertigo   . 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 Leg cramping    "@ night" (09/18/2013)  . Migraine    "once q couple months" (09/18/2013)  . Osteoporosis   . Peripheral neuropathy ([place])   . Type II diabetes mellitus ([place])   . Umbilical hernia    unrepaired (09/18/2013)    Past Surgical History:  Procedure Laterality Date  . CESAREAN SECTION  1989  . LAPAROSCOPIC ASSISTED VAGINAL HYSTERECTOMY  09/23/2012   Procedure: LAPAROSCOPIC ASSISTED VAGINAL HYSTERECTOMY;  Surgeon: Linda Hedges, DO;  Location: [facility];  Service: Gynecology;  Laterality: N/A;  pull Dr Gregor Hams instrument  . TUBAL LIGATION Bilateral 1992  . VAGINAL HYSTERECTOMY  2013    Social History   Social History  . Marital status: Married    Spouse name: N/A  . Number of children: 2  . Years of education: N/A   Occupational History  . UNEMPLOYED Unemployed     homemaker   Social History Main Topics  . Smoking status: Former Smoker    Years: 0.20    Types: Cigarettes    Quit date: 12/10/1990  . Smokeless tobacco: Never Used  . Alcohol use No  . Drug use: No  . Sexual activity: Yes   Other Topics Concern  . Not on file   Social History Narrative   Regular exercise- no    Diet- lots of mountain dew, limits fast foods     Mobility: Without assistive devices Work history: Not obtained   Allergies  Allergen Reactions  . Codeine Hives, Anxiety and Other (See Comments)    REACTION: N' \T' \ V and hyper. Pt can take Percocet.    Family History  Problem Relation Age of Onset  . Diabetes Other   . Heart disease Other   . Heart failure Mother   . Diabetes Maternal Grandmother   . Alzheimer's disease Maternal Grandmother   . Coronary artery disease Maternal Grandmother   . Heart attack Maternal Grandfather   . Heart failure Brother      Prior to Admission medications   Medication Sig Start Date End Date Taking? Authorizing Provider  bismuth-metronidazole-tetracycline [facility]) [phone] MG capsule Take 3 capsules by mouth 4 (four) times daily -  before meals and at bedtime. 02/01/17 02/15/17 Yes Milus Banister, MD  atorvastatin (LIPITOR) 80 MG tablet Take 1 tablet (80 mg total) by mouth daily at 6 PM. 09/04/16   Amy E Diona Browner, MD  cetirizine (ZYRTEC) 10 MG tablet Take 1 tablet (10 mg total) by mouth daily. 04/24/16   Jearld Fenton, NP  dicyclomine (BENTYL) 10 MG capsule Take 1 capsule (10 mg total) by mouth 4 (four) times daily -  before meals and at bedtime. 09/11/16   Amy Cletis Athens, MD  ezetimibe (ZETIA) 10 MG tablet Take 1 tablet (10 mg total) by mouth daily. 09/11/16   Amy Cletis Athens, MD  fluticasone (FLONASE) 50 MCG/ACT nasal spray Place 2 sprays into both nostrils daily. 04/24/16   Jearld Fenton, NP  glucose blood test strip Use 3x a day - One Touch Verio Flex meter 07/17/16   Philemon Kingdom, MD  HUMALOG KWIKPEN 100 UNIT/ML KiwkPen  INJECT 0.12-0.16 MLS (12-16 UNITS TOTAL) INTO THE SKIN 3 (THREE) TIMES DAILY. 01/15/17   Historical Provider, MD  Insulin Glargine (BASAGLAR KWIKPEN) 100 UNIT/ML SOPN INJECT 50 UNITS INTO THE SKIN DAILY AT 10 PM. 12/20/16   Philemon Kingdom, MD  insulin lispro (HUMALOG) 100 UNIT/ML injection Inject 0.15-0.2 mLs (15-20 Units total) into the skin 3 (three) times daily before meals. 10/02/16   Philemon Kingdom, MD  Insulin Pen Needle (CAREFINE PEN NEEDLES) 32G X 4 MM MISC Use 4x a day 07/17/16   Philemon Kingdom, MD  levothyroxine (SYNTHROID, LEVOTHROID) 200 MCG tablet TAKE 1 TABLET BY MOUTH DAILY TAKE ALONG WITH THE 12.5 MG DOSE. FOR TOTAL OF 212.5MCG 01/30/17   Philemon Kingdom, MD  levothyroxine (SYNTHROID, LEVOTHROID) 25 MCG tablet Take 0.5 tablets (12.5 mcg total) by mouth daily before breakfast. In addition to 200 mcg daily for TDD 212.5 mcg 07/17/16   Philemon Kingdom,  MD  metFORMIN (GLUCOPHAGE-XR) 500 MG 24 hr tablet Take 2 tablets (1,000 mg total) by mouth 2 (two) times daily after a meal. 09/07/16   Philemon Kingdom, MD  omeprazole (PRILOSEC) 40 MG capsule Take 1 capsule (40 mg total) by mouth daily. 01/22/17   Amy Cletis Athens, MD  ondansetron (ZOFRAN) 4 MG tablet Take 1 tablet (4 mg total) by mouth every 6 (six) hours. 02/05/17   Amy Cletis Athens, MD  ONE TOUCH LANCETS MISC Use 3x a day - One Touch Verio Flex meter 07/17/16   Philemon Kingdom, MD  RELPAX 40 MG tablet ONE TABLET BY MOUTH AT ONSET OF HEADACHE. MAY REPEAT IN 2 HOURS IF HEADACHE PERSISTS OR RECURS. 10/17/16   Amy Cletis Athens, MD  sertraline (ZOLOFT) 50 MG tablet Take 1 tablet (50 mg total) by mouth daily. 02/05/17   Jinny Sanders, MD    Physical Exam: Vitals:   02/09/17 0819 02/09/17 0830 02/09/17 0900 02/09/17 0946  BP: 96/82 110/78 102/67 94/65  Pulse: 78 84 94 93  Resp: '14 18 19 14  ' Temp:      TempSrc:      SpO2: 98% 98% 98% 97%  Weight:      Height:          Constitutional: NAD, calm, comfortable-Was sleeping upon my entry into the  room-appears older than stated age Eyes: PERRL, lids and conjunctivae normal ENMT: Mucous membranes are dry. Posterior pharynx clear of any exudate or lesions. Poor dentition Neck: normal, supple, no masses, no thyromegaly Respiratory: clear to auscultation bilaterally, no wheezing, no crackles. Normal respiratory effort. No accessory muscle use.  Cardiovascular: Regular rate and rhythm, no murmurs / rubs / gallops. No extremity edema. 2+ pedal pulses. No carotid bruits.  Abdomen: Focal epigastric tenderness without guarding or rebounding, no masses palpated. No hepatosplenomegaly. Bowel sounds positive.  Musculoskeletal: no clubbing / cyanosis. No joint deformity upper and lower extremities. Good ROM, no contractures. Normal muscle tone.  Skin: no rashes, lesions, ulcers. No induration Neurologic: CN 2-12 grossly intact. Sensation intact, DTR normal. Strength 5/5 x all 4 extremities.  Psychiatric: Normal judgment and insight. Alert and oriented x 3. Normal mood.    Labs on Admission: I have personally reviewed following labs and imaging studies  CBC:  Recent Labs Lab 02/09/17 0625  WBC 19.9*  HGB 16.3*  HCT 45.8  MCV 83.6  PLT 767   Basic Metabolic Panel:  Recent Labs Lab 02/09/17 0625  NA 133*  K 3.8  CL 96*  CO2 20*  GLUCOSE 414*  BUN 13  CREATININE 0.76  CALCIUM 9.6   GFR: Estimated Creatinine Clearance: 66.9 mL/min (by C-G formula based on SCr of 0.76 mg/dL). Liver Function Tests:  Recent Labs Lab 02/09/17 0738  AST 18  ALT 12*  ALKPHOS 63  BILITOT 0.6  PROT 7.5  ALBUMIN 4.4    Recent Labs Lab 02/09/17 0738  LIPASE 19   No results for input(s): AMMONIA in the last 168 hours. Coagulation Profile: No results for input(s): INR, PROTIME in the last 168 hours. Cardiac Enzymes: No results for input(s): CKTOTAL, CKMB, CKMBINDEX, TROPONINI in the last 168 hours. BNP (last 3 results) No results for input(s): PROBNP in the last 8760 hours. HbA1C: No  results for input(s): HGBA1C in the last 72 hours. CBG:  Recent Labs Lab 02/09/17 0916  GLUCAP 350*   Lipid Profile: No results for input(s): CHOL, HDL, LDLCALC, TRIG, CHOLHDL, LDLDIRECT in the last 72 hours. Thyroid Function Tests: No  results for input(s): TSH, T4TOTAL, FREET4, T3FREE, THYROIDAB in the last 72 hours. Anemia Panel: No results for input(s): VITAMINB12, FOLATE, FERRITIN, TIBC, IRON, RETICCTPCT in the last 72 hours. Urine analysis:    Component Value Date/Time   COLORURINE YELLOW 02/09/2017 0735   APPEARANCEUR CLEAR 02/09/2017 0735   LABSPEC 1.022 02/09/2017 0735   PHURINE 6.0 02/09/2017 0735   GLUCOSEU >=500 (A) 02/09/2017 0735   HGBUR NEGATIVE 02/09/2017 0735   BILIRUBINUR NEGATIVE 02/09/2017 0735   BILIRUBINUR negative 09/11/2016 1038   KETONESUR 20 (A) 02/09/2017 0735   PROTEINUR 30 (A) 02/09/2017 0735   UROBILINOGEN 0.2 09/11/2016 1038   UROBILINOGEN 0.2 10/28/2013 1723   NITRITE NEGATIVE 02/09/2017 0735   LEUKOCYTESUR NEGATIVE 02/09/2017 0735   Sepsis Labs: '@LABRCNTIP' (procalcitonin:4,lacticidven:4) )No results found for this or any previous visit (from the past 240 hour(s)).   Radiological Exams on Admission: Dg Abd Acute W/chest  Result Date: 02/09/2017 CLINICAL DATA:  Mid chest pain and diffuse abdominal pain. Nausea and vomiting. EXAM: DG ABDOMEN ACUTE W/ 1V CHEST COMPARISON:  Abdominal radiograph 09/11/2016. Chest radiographs 07/24/2016. FINDINGS: The cardiomediastinal silhouette is within normal limits. The lungs are well inflated and clear. No pleural effusion or pneumothorax is identified. There is no evidence of intraperitoneal free air. There is a paucity of bowel gas which limits evaluation for obstruction. No dilated loops of bowel are seen, and no air-fluid levels are evident. A small amount of stool is present in the colon. No acute osseous abnormality is identified. IMPRESSION: 1. No evidence of acute cardiopulmonary disease. 2. No acute  abnormality identified in the abdomen. Electronically Signed   By: Logan Bores M.D.   On: 02/09/2017 08:26    EKG: (Independently reviewed) sinus rhythm with ventricular rate 74 bpm, QTC 4 and 82 ms, elevated J point in inferior lateral leads, delayed R-wave progression in septal leads with voltage criteria met for LVH  Assessment/Plan Principal Problem:   DKA, type 2  -Presents with nausea,vomiting, diaphoresis and chest discomfort more typical of reflux with upper tori findings of serum glucose greater than 400 with an anion gap of 17 consistent with DKA -DKA order set initiated -Insulin infusion until anion gap normalized -While on insulin drip will hold home [employer] and metformin -BMET q 4 hrs -Hemoglobin A1c last month was 12.3 -Patient admits to forgetting to take her insulin frequently and not consistently checking her CBGs unless she feels poorly -Patient reports feeling "lousy" if her CBGs are less than 200-discussed with her this is not uncommon for people who have persistent hyperglycemia and her body will need to adjust to the lower readings once CBGs better controlled -She is amenable to diabetes educator consultation -Continue volume replacement with transition to dextrose containing fluids once CBG less than or equal to 250 -Informed patient that poorly controlled diabetes can lead to vision loss, kidney failure, peripheral vascular disease, stroke and heart attack and potential death  ** 1116: AG 12, CBG 285. Insulin drip never started- pt did receive 10 units regular insulin IV and aggressive IVF hydration. Will resume Lantus, meal coverage and SSI; carb modified/heart healthy diet ordered  Active Problems:   Chest pain -Given patient's age, family history of CAD and underlying personal comorbidities (especially diabetes) willl cycle enzymes and obtain echocardiogram -No chronic symptomatology and EKG/initial troponin reassuring but 2nd poc was elevated at 0.20 so  will obtain formal Cardiology eval to help delineate between cardiac ischemia vs demand ischemia   Hypomagnesemia -IV replacement - lab in am **  had burst NSVT while in ER    Hypothyroidism -Continue Synthroid    GERD (gastroesophageal reflux disease)/H. pylori infection -Continue PPI -Continue preadmission "triple antibiotic" regimen-has been utilizing 1 week    Hyperlipidemia -Continue Zetia and Lipitor    ANXIETY DISORDER, GENERALIZED -Started on Zoloft by PCP on 2/27 and will continue during hospitalization    Irritable bowel syndrome -Continue Bentyl    TOBACCO ABUSE -States she quit several months ago but then clarified that she only smokes "a little bit" when she is under stress      DVT prophylaxis: Lovenox Code Status: Full Family Communication: Husband Disposition Plan: Home Consults called: Cardiology/[place]    ELLIS,ALLISON L. ANP-BC [facility] Pager [phone]   If 7PM-7AM, please contact night-coverage [email or web address] Password [email or web address]  02/09/2017, 10:03 AM

## 2017-02-09 NOTE — ED Provider Notes (Signed)
[facility] DEPT Provider Note   CSN: [id number] Arrival date & time: 02/09/17  [id number]     History   Chief Complaint Chief Complaint  Patient presents with  . Chest Pain    HPI [name] [name] is a 56 y.o. female.  The history is provided by the patient and the spouse.  Chest Pain   This is a new problem. The current episode started 3 to 5 hours ago. The problem occurs constantly. The problem has not changed since onset.Associated with: ate a honey bun last night, admits to noncompliance with insulin. The pain is present in the substernal region. The pain is mild. The quality of the pain is described as pressure-like. Associated symptoms include abdominal pain, malaise/fatigue and vomiting (x1). Pertinent negatives include no cough, no fever and no shortness of breath. She has tried nothing for the symptoms. Risk factors: DM.  Her past medical history is significant for anxiety/panic attacks and hyperlipidemia.  Pertinent negatives for past medical history include no CAD, no hypertension, no MI, no PE and no strokes.    Past Medical History:  Diagnosis Date  . Anxiety   . B12 deficiency   . GERD (gastroesophageal reflux disease)   . Hyperlipidemia   . Hypertension    [name]  . Hypothyroidism   . IBS (irritable bowel syndrome)   . Intermittent vertigo   . 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 Leg cramping    "@ night" (09/18/2013)  . Migraine    "once q couple months" (09/18/2013)  . Osteoporosis   . Peripheral neuropathy ([place])   . Type II diabetes mellitus ([place])   . Umbilical hernia    unrepaired (09/18/2013)    Patient Active Problem List   Diagnosis Date Noted  . Chronic insomnia 02/05/2017  . Epigastric pain 01/22/2017  . Early satiety 01/22/2017  . DKA, type 2 ([place]) 07/24/2016  . Hyponatremia 07/24/2016  . AKI (acute kidney injury) ([place]) 07/24/2016  . Type 2 diabetes mellitus with ketoacidosis without coma, with long-term current  use of insulin ([place]) 07/17/2016  . Atrial fibrillation ([place]) 02/01/2015  . Bursitis of right shoulder 01/10/2015  . Left lateral abdominal pain 06/04/2014  . 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 Colitis, indeterminate 09/18/2013  . Hepatic lesion 09/18/2013  . Diverticulosis 09/18/2013  . Vitamin D deficiency 05/20/2012  . TOBACCO ABUSE 02/19/2011  . GLAUCOMA 02/01/2011  . Essential hypertension 02/01/2011  . Osteoporosis 02/01/2011  . INTERMITTENT VERTIGO 12/15/2010  . B12 deficiency 04/04/2010  . PERIPHERAL NEUROPATHY 05/10/2009  . GERD 04/08/2009  . ANXIETY DISORDER, GENERALIZED 06/22/2008  . Hypothyroidism 04/29/2008  . Hyperlipidemia 04/29/2008  . Migraine without aura and without status migrainosus, not intractable 04/29/2008  . ALLERGIC RHINITIS 04/29/2008  . Irritable bowel syndrome 04/29/2008    Past Surgical History:  Procedure Laterality Date  . CESAREAN SECTION  1989  . LAPAROSCOPIC ASSISTED VAGINAL HYSTERECTOMY  09/23/2012   Procedure: LAPAROSCOPIC ASSISTED VAGINAL HYSTERECTOMY;  Surgeon: Linda Hedges, DO;  Location: [facility];  Service: Gynecology;  Laterality: N/A;  pull Dr Gregor Hams instrument  . TUBAL LIGATION Bilateral 1992  . VAGINAL HYSTERECTOMY  2013    OB History    Gravida Para Term Preterm AB Living   2 2           SAB TAB Ectopic Multiple Live Births                   Home Medications  Prior to Admission medications   Medication Sig Start Date End Date Taking? Authorizing Provider  bismuth-metronidazole-tetracycline [facility]) [phone] MG capsule Take 3 capsules by mouth 4 (four) times daily -  before meals and at bedtime. 02/01/17 02/15/17 Yes Milus Banister, MD  atorvastatin (LIPITOR) 80 MG tablet Take 1 tablet (80 mg total) by mouth daily at 6 PM. 09/04/16   Amy E Diona Browner, MD  cetirizine (ZYRTEC) 10 MG tablet Take 1 tablet (10 mg total) by mouth daily. 04/24/16   Jearld Fenton, NP  dicyclomine (BENTYL) 10 MG capsule  Take 1 capsule (10 mg total) by mouth 4 (four) times daily -  before meals and at bedtime. 09/11/16   Amy Cletis Athens, MD  ezetimibe (ZETIA) 10 MG tablet Take 1 tablet (10 mg total) by mouth daily. 09/11/16   Amy Cletis Athens, MD  fluticasone (FLONASE) 50 MCG/ACT nasal spray Place 2 sprays into both nostrils daily. 04/24/16   Jearld Fenton, NP  glucose blood test strip Use 3x a day - One Touch Verio Flex meter 07/17/16   Philemon Kingdom, MD  HUMALOG KWIKPEN 100 UNIT/ML KiwkPen INJECT 0.12-0.16 MLS (12-16 UNITS TOTAL) INTO THE SKIN 3 (THREE) TIMES DAILY. 01/15/17   Historical Provider, MD  Insulin Glargine (BASAGLAR KWIKPEN) 100 UNIT/ML SOPN INJECT 50 UNITS INTO THE SKIN DAILY AT 10 PM. 12/20/16   Philemon Kingdom, MD  insulin lispro (HUMALOG) 100 UNIT/ML injection Inject 0.15-0.2 mLs (15-20 Units total) into the skin 3 (three) times daily before meals. 10/02/16   Philemon Kingdom, MD  Insulin Pen Needle (CAREFINE PEN NEEDLES) 32G X 4 MM MISC Use 4x a day 07/17/16   Philemon Kingdom, MD  levothyroxine (SYNTHROID, LEVOTHROID) 200 MCG tablet TAKE 1 TABLET BY MOUTH DAILY TAKE ALONG WITH THE 12.5 MG DOSE. FOR TOTAL OF 212.5MCG 01/30/17   Philemon Kingdom, MD  levothyroxine (SYNTHROID, LEVOTHROID) 25 MCG tablet Take 0.5 tablets (12.5 mcg total) by mouth daily before breakfast. In addition to 200 mcg daily for TDD 212.5 mcg 07/17/16   Philemon Kingdom, MD  metFORMIN (GLUCOPHAGE-XR) 500 MG 24 hr tablet Take 2 tablets (1,000 mg total) by mouth 2 (two) times daily after a meal. 09/07/16   Philemon Kingdom, MD  omeprazole (PRILOSEC) 40 MG capsule Take 1 capsule (40 mg total) by mouth daily. 01/22/17   Amy Cletis Athens, MD  ondansetron (ZOFRAN) 4 MG tablet Take 1 tablet (4 mg total) by mouth every 6 (six) hours. 02/05/17   Amy Cletis Athens, MD  ONE TOUCH LANCETS MISC Use 3x a day - One Touch Verio Flex meter 07/17/16   Philemon Kingdom, MD  RELPAX 40 MG tablet ONE TABLET BY MOUTH AT ONSET OF HEADACHE. MAY REPEAT IN 2 HOURS IF HEADACHE  PERSISTS OR RECURS. 10/17/16   Amy Cletis Athens, MD  sertraline (ZOLOFT) 50 MG tablet Take 1 tablet (50 mg total) by mouth daily. 02/05/17   Amy Cletis Athens, MD    Family History Family History  Problem Relation Age of Onset  . Diabetes Other   . Heart disease Other   . Heart failure Mother   . Diabetes Maternal Grandmother   . Alzheimer's disease Maternal Grandmother   . Coronary artery disease Maternal Grandmother   . Heart attack Maternal Grandfather   . Heart failure Brother     Social History Social History  Substance Use Topics  . Smoking status: Former Smoker    Years: 0.20    Types: Cigarettes    Quit date: 12/10/1990  .  Smokeless tobacco: Never Used  . Alcohol use No     Allergies   Codeine   Review of Systems Review of Systems  Constitutional: Positive for malaise/fatigue. Negative for fever.  Respiratory: Negative for cough and shortness of breath.   Cardiovascular: Positive for chest pain.  Gastrointestinal: Positive for abdominal pain and vomiting (x1).  All other systems reviewed and are negative.    Physical Exam Updated Vital Signs BP 124/76 (BP Location: Right Arm)   Pulse 71   Temp 97.8 F (36.6 C) (Oral)   Resp 21   Ht 5\' 1"  (1.549 m)   Wt (!) 1239 lb (562 kg)   LMP 08/11/2012   SpO2 100%   BMI 234.11 kg/m   Physical Exam  Constitutional: She is oriented to person, place, and time. She appears well-developed. She appears listless. She appears ill. No distress.  HENT:  Head: Normocephalic.  Nose: Nose normal.  Eyes: Conjunctivae are normal.  Neck: Neck supple. No tracheal deviation present.  Cardiovascular: Normal rate, regular rhythm and normal heart sounds.   Pulmonary/Chest: Effort normal and breath sounds normal. No respiratory distress. She has no wheezes.  Abdominal: Soft. She exhibits no distension. There is tenderness (diffuse, mild).  Neurological: She is oriented to person, place, and time. She appears listless.  Skin: Skin is  warm. Capillary refill takes less than 2 seconds. She is diaphoretic.  Psychiatric: She has a normal mood and affect.     ED Treatments / Results  Labs (all labs ordered are listed, but only abnormal results are displayed) Labs Reviewed  BASIC METABOLIC PANEL - Abnormal; Notable for the following:       Result Value   Sodium 133 (*)    Chloride 96 (*)    CO2 20 (*)    Glucose, Bld 414 (*)    Anion gap 17 (*)    All other components within normal limits  CBC - Abnormal; Notable for the following:    WBC 19.9 (*)    RBC 5.48 (*)    Hemoglobin 16.3 (*)    All other components within normal limits  HEPATIC FUNCTION PANEL - Abnormal; Notable for the following:    ALT 12 (*)    All other components within normal limits  URINALYSIS, ROUTINE W REFLEX MICROSCOPIC - Abnormal; Notable for the following:    Glucose, UA >=500 (*)    Ketones, ur 20 (*)    Protein, ur 30 (*)    Squamous Epithelial / LPF 0-5 (*)    All other components within normal limits  I-STAT VENOUS BLOOD GAS, ED - Abnormal; Notable for the following:    pCO2, Ven 41.5 (*)    pO2, Ven 20.0 (*)    Acid-base deficit 4.0 (*)    All other components within normal limits  CBG MONITORING, ED - Abnormal; Notable for the following:    Glucose-Capillary 350 (*)    All other components within normal limits  LIPASE, BLOOD  TROPONIN I  TROPONIN I  TROPONIN I  I-STAT TROPOININ, ED  POC OCCULT BLOOD, ED  I-STAT TROPOININ, ED    EKG  EKG Interpretation  Date/Time:  Saturday February 09 2017 06:24:00 EST Ventricular Rate:  74 PR Interval:    QRS Duration: 98 QT Interval:  434 QTC Calculation: 482 R Axis:   88 Text Interpretation:  Sinus rhythm Anteroseptal infarct, age indeterminate When compared with ECG of 07/24/2016, No significant change was found Confirmed by [facility]  MD, DAVID ([id number]) on 02/09/2017  6:31:49 AM       Radiology Dg Abd Acute W/chest  Result Date: 02/09/2017 CLINICAL DATA:  Mid chest pain and diffuse  abdominal pain. Nausea and vomiting. EXAM: DG ABDOMEN ACUTE W/ 1V CHEST COMPARISON:  Abdominal radiograph 09/11/2016. Chest radiographs 07/24/2016. FINDINGS: The cardiomediastinal silhouette is within normal limits. The lungs are well inflated and clear. No pleural effusion or pneumothorax is identified. There is no evidence of intraperitoneal free air. There is a paucity of bowel gas which limits evaluation for obstruction. No dilated loops of bowel are seen, and no air-fluid levels are evident. A small amount of stool is present in the colon. No acute osseous abnormality is identified. IMPRESSION: 1. No evidence of acute cardiopulmonary disease. 2. No acute abnormality identified in the abdomen. Electronically Signed   By: Logan Bores M.D.   On: 02/09/2017 08:26    Procedures Procedures (including critical care time)  Medications Ordered in ED Medications  dextrose 5 %-0.45 % sodium chloride infusion (not administered)  insulin regular (NOVOLIN R,HUMULIN R) 250 Units in sodium chloride 0.9 % 250 mL (1 Units/mL) infusion (not administered)  0.9 %  sodium chloride infusion (not administered)  insulin aspart (novoLOG) injection 10 Units (10 Units Intravenous Given 02/09/17 0818)  metoCLOPramide (REGLAN) injection 10 mg (10 mg Intravenous Given 02/09/17 0817)  lactated ringers bolus 1,000 mL (1,000 mLs Intravenous New Bag/Given 02/09/17 [id number])    Followed by  lactated ringers bolus 1,000 mL (1,000 mLs Intravenous New Bag/Given 02/09/17 0817)     Initial Impression / Assessment and Plan / ED Course  I have reviewed the triage vital signs and the nursing notes.  Pertinent labs & imaging results that were available during my care of the patient were reviewed by me and considered in my medical decision making (see chart for details).     56 year old female presents with nausea, vomiting, diffuse abdominal pain and chest pain described as pressure all starting this morning. She is diaphoretic and  uncomfortable on arrival refusing to interact initially but later is able to provide a history herself with the help of her husband.  Glucose severely elevated above 400, patient has metabolic acidosis with anion gap of 17 suggesting early DKA. She was given IV insulin and fluid boluses but corrected her glucose to only 350. First troponin is negative and EKG is unremarkable, doubt ACS given her other clinical features but will require multiple troponins to rule out due to the recent onset of her symptoms. Hospitalist was consulted for admission and will see the patient in the emergency department.   Final Clinical Impressions(s) / ED Diagnoses   Final diagnoses:  Chest pain  Diabetic ketoacidosis without coma associated with type 2 diabetes mellitus ([name])  Hyperglycemia  Non-intractable vomiting with nausea, unspecified vomiting type    New Prescriptions New Prescriptions   No medications on file     Leo Grosser, MD 02/09/17 [phone]

## 2017-02-09 NOTE — ED Notes (Signed)
Pt is in xray at this time 

## 2017-02-09 NOTE — Progress Notes (Signed)
ANTICOAGULATION CONSULT NOTE - Initial Consult  Pharmacy Consult for heparin Indication: chest pain/ACS  Allergies  Allergen Reactions  . Codeine Hives, Anxiety and Other (See Comments)    Patient Measurements: Height: 5\' 1"  (154.9 cm) Weight: 129 lb (58.5 kg) IBW/kg (Calculated) : 47.8 Heparin Dosing Weight: 58 kg  Vital Signs: Temp: 97.8 F (36.6 C) (03/03 0625) Temp Source: Oral (03/03 0625) BP: 106/69 (03/03 1600) Pulse Rate: 90 (03/03 1600)  Labs:  Recent Labs  02/09/17 0625 02/09/17 0958 02/09/17 1510  HGB 16.3*  --   --   HCT 45.8  --   --   PLT 371  --   --   CREATININE 0.76 0.79  --   TROPONINI  --  0.20* 0.31*    Estimated Creatinine Clearance: 66.9 mL/min (by C-G formula based on SCr of 0.79 mg/dL).  Assessment: CC/HPI: 56 yo f presenting with CP  PMH: DM, CAD  Anticoag: none pta iv hep for r/o acs  Renal: SCr 0.79  Heme/Onc: H&H 16.3/45.8, Plt 371  Goal of Therapy:  Heparin level 0.3-0.7 units/ml Monitor platelets by anticoagulation protocol: Yes   Plan:  3500 heparin bolus Influsion at 650 units/hr Initial lvl 0000 Daily hl, cbc F/u cards plans  Levester Fresh, PharmD, BCPS, BCCCP Clinical Pharmacist 02/09/2017 4:38 PM

## 2017-02-10 DIAGNOSIS — K219 Gastro-esophageal reflux disease without esophagitis: Secondary | ICD-10-CM

## 2017-02-10 DIAGNOSIS — I200 Unstable angina: Secondary | ICD-10-CM | POA: Diagnosis not present

## 2017-02-10 DIAGNOSIS — E1110 Type 2 diabetes mellitus with ketoacidosis without coma: Secondary | ICD-10-CM | POA: Diagnosis not present

## 2017-02-10 DIAGNOSIS — E039 Hypothyroidism, unspecified: Secondary | ICD-10-CM | POA: Diagnosis not present

## 2017-02-10 DIAGNOSIS — I214 Non-ST elevation (NSTEMI) myocardial infarction: Secondary | ICD-10-CM | POA: Diagnosis not present

## 2017-02-10 DIAGNOSIS — E1310 Other specified diabetes mellitus with ketoacidosis without coma: Secondary | ICD-10-CM | POA: Diagnosis not present

## 2017-02-10 DIAGNOSIS — Z794 Long term (current) use of insulin: Secondary | ICD-10-CM | POA: Diagnosis not present

## 2017-02-10 LAB — BASIC METABOLIC PANEL
Anion gap: 7 (ref 5–15)
BUN: 9 mg/dL (ref 6–20)
CALCIUM: 8.7 mg/dL — AB (ref 8.9–10.3)
CO2: 25 mmol/L (ref 22–32)
CREATININE: 0.64 mg/dL (ref 0.44–1.00)
Chloride: 107 mmol/L (ref 101–111)
GFR calc non Af Amer: >60 mL/min (ref >60)
GLUCOSE: 120 mg/dL — AB (ref 65–99)
Potassium: 3.7 mmol/L (ref 3.5–5.1)
Sodium: 139 mmol/L (ref 135–145)

## 2017-02-10 LAB — CBC
HCT: 39.3 % (ref 36.0–46.0)
Hemoglobin: 13.5 g/dL (ref 12.0–15.0)
MCH: 29.1 pg (ref 26.0–34.0)
MCHC: 34.4 g/dL (ref 30.0–36.0)
MCV: 84.7 fL (ref 78.0–100.0)
PLATELETS: 287 10*3/uL (ref 150–400)
RBC: 4.64 MIL/uL (ref 3.87–5.11)
RDW: 14.2 % (ref 11.5–15.5)
WBC: 16 10*3/uL — AB (ref 4.0–10.5)

## 2017-02-10 LAB — PROTIME-INR
INR: 0.96
PROTHROMBIN TIME: 12.8 s (ref 11.4–15.2)

## 2017-02-10 LAB — GLUCOSE, CAPILLARY
GLUCOSE-CAPILLARY: 148 mg/dL — AB (ref 65–99)
GLUCOSE-CAPILLARY: 162 mg/dL — AB (ref 65–99)
Glucose-Capillary: 128 mg/dL — ABNORMAL HIGH (ref 65–99)
Glucose-Capillary: 84 mg/dL (ref 65–99)
Glucose-Capillary: 73 mg/dL (ref 65–99)

## 2017-02-10 LAB — HEPARIN LEVEL (UNFRACTIONATED)
HEPARIN UNFRACTIONATED: 0.39 [IU]/mL (ref 0.30–0.70)
Heparin Unfractionated: 0.33 IU/mL (ref 0.30–0.70)
Heparin Unfractionated: 0.26 IU/mL — ABNORMAL LOW (ref 0.30–0.70)

## 2017-02-10 LAB — HIV ANTIBODY (ROUTINE TESTING W REFLEX): HIV SCREEN 4TH GENERATION: NONREACTIVE

## 2017-02-10 LAB — MAGNESIUM: MAGNESIUM: 2 mg/dL (ref 1.7–2.4)

## 2017-02-10 MED ORDER — POTASSIUM CHLORIDE CRYS ER 10 MEQ PO TBCR
30.0000 meq | EXTENDED_RELEASE_TABLET | Freq: Once | ORAL | Status: AC
  Administered 2017-02-10: 30 meq via ORAL
  Filled 2017-02-10: qty 1

## 2017-02-10 MED ORDER — SODIUM CHLORIDE 0.9 % IV SOLN: 250.0000 mL | INTRAVENOUS | Status: DC | PRN

## 2017-02-10 MED ORDER — INSULIN ASPART 100 UNIT/ML ~~LOC~~ SOLN: 0.0000 [IU] | Freq: Three times a day (TID) | SUBCUTANEOUS | Status: DC

## 2017-02-10 MED ORDER — SODIUM CHLORIDE 0.9 % WEIGHT BASED INFUSION: 1.0000 mL/kg/h | INTRAVENOUS | Status: DC

## 2017-02-10 MED ORDER — SODIUM CHLORIDE 0.9% FLUSH: 3.0000 mL | Freq: Two times a day (BID) | INTRAVENOUS | Status: DC

## 2017-02-10 MED ORDER — SODIUM CHLORIDE 0.9 % WEIGHT BASED INFUSION: 3.0000 mL/kg/h | INTRAVENOUS | Status: DC

## 2017-02-10 MED ORDER — MORPHINE SULFATE (PF) 2 MG/ML IV SOLN
2.0000 mg | INTRAVENOUS | Status: DC | PRN
  Administered 2017-02-10 – 2017-02-11 (×2): 2 mg via INTRAVENOUS
  Filled 2017-02-10 (×2): qty 1

## 2017-02-10 MED ORDER — SODIUM CHLORIDE 0.9% FLUSH: 3.0000 mL | INTRAVENOUS | Status: DC | PRN

## 2017-02-10 MED ORDER — ASPIRIN 81 MG PO CHEW
81.0000 mg | CHEWABLE_TABLET | ORAL | Status: AC
  Administered 2017-02-11: 81 mg via ORAL
  Filled 2017-02-10: qty 1

## 2017-02-10 NOTE — Progress Notes (Signed)
Subjective:  Still with some chest pain this morning.  Pain is better if she lies down and worse if she sits up.  Sugars have come down with treatment.  Not currently short of breath.  Echocardiogram yesterday showed some reduction in LV systolic function with EF around 40%.  There was some hypokinesis in the septum apex and the lateral wall.  Objective:  Vital Signs in the last 24 hours: BP (!) 90/56 (BP Location: Right Arm)   Pulse 85   Temp 98.9 F (37.2 C) (Oral)   Resp 20   Ht 5\' 1"  (1.549 m)   Wt 58.5 kg (129 lb)   LMP 08/11/2012   SpO2 96%   BMI 24.37 kg/m   Physical Exam: Somewhat small framed white female in no acute distress Lungs:  Clear Cardiac:  Regular rhythm, normal S1 and S2, no S3 Abdomen:  Soft, nontender, no masses Extremities:  No edema present  Intake/Output from previous day: 03/03 0701 - 03/04 0700 In: 4176.1 [I.V.:2126.1; IV Piggyback:2050] Out: -   Weight Filed Weights   02/09/17 0621 02/09/17 1646  Weight: 58.5 kg (129 lb) 58.5 kg (129 lb)    Lab Results: Basic Metabolic Panel:  Recent Labs  02/09/17 0958 02/09/17 1952  NA 137 137  K 3.5 3.1*  CL 103 105  CO2 22 20*  GLUCOSE 285* 152*  BUN 11 10  CREATININE 0.79 0.67   CBC:  Recent Labs  02/09/17 0625 02/10/17 0208  WBC 19.9* 16.0*  HGB 16.3* 13.5  HCT 45.8 39.3  MCV 83.6 84.7  PLT 371 287   Cardiac Enzymes: Troponin (Point of Care Test)  Recent Labs  02/09/17 1010  TROPIPOC 0.20*   Cardiac Panel (last 3 results)  Recent Labs  02/09/17 0958 02/09/17 1510 02/09/17 1952  TROPONINI 0.20* 0.31* 0.23*    Telemetry: Personally reviewed, sinus rhythm  Assessment/Plan:  1.  Ongoing chest discomfort with low level troponin elevations.  Previous catheterization did not show significant obstructive coronary disease 6 years ago.  However with reduced LV function and borderline troponins and multiple risk factors I think she has will need cardiac catheterization for  further evaluation.  Sugars are under better control. 2.  Abnormal troponin could be demand ischemia due to DKA 3.  Poorly controlled diabetes mellitus  Recommendations:  Cardiac catheterization was discussed with the patient fully including risks of myocardial infarction, death, stroke, bleeding, arrhythmia, dye allergy, renal insufficiency or bleeding.  The patient understands and is willing to proceed.  Possibility of intervention at the same time also discussed with patient and husband and they understand and are agreeable to proceed.  We'll proceed to schedule catheterization later tomorrow.      Kerry Hough  MD [facility]  02/10/2017, 7:43 AM

## 2017-02-10 NOTE — Progress Notes (Signed)
ANTICOAGULATION CONSULT NOTE - Follow Up Consult  Pharmacy Consult for Heparin  Indication: chest pain/ACS  Allergies  Allergen Reactions  . Codeine Hives, Anxiety and Other (See Comments)   Patient Measurements: Height: 5\' 1"  (154.9 cm) Weight: 129 lb (58.5 kg) IBW/kg (Calculated) : 47.8  Vital Signs: Temp: 98.6 F (37 C) (03/03 1952) Temp Source: Oral (03/03 1952) BP: 125/91 (03/03 1952) Pulse Rate: 91 (03/03 1952)  Labs:  Recent Labs  02/09/17 0625 02/09/17 0958 02/09/17 1510 02/09/17 1952 02/09/17 2355  HGB 16.3*  --   --   --   --   HCT 45.8  --   --   --   --   PLT 371  --   --   --   --   HEPARINUNFRC  --   --   --   --  0.39  CREATININE 0.76 0.79  --  0.67  --   TROPONINI  --  0.20* 0.31* 0.23*  --     Estimated Creatinine Clearance: 66.9 mL/min (by C-G formula based on SCr of 0.67 mg/dL).   Assessment: Heparin for elevated troponin, NST vs. Cath, initial heparin level is therapeutic  Goal of Therapy:  Heparin level 0.3-0.7 units/ml Monitor platelets by anticoagulation protocol: Yes   Plan:  -Cont heparin 650 units/hr -AM HL  Narda Bonds 02/10/2017,12:32 AM

## 2017-02-10 NOTE — Progress Notes (Signed)
Patient ID: [name] [name], female   DOB: 08-23-1961, 56 y.o.   MRN: [id number]                                                                PROGRESS NOTE                                                                                                                                                                                                             Patient Demographics:    [name] [name], is a 56 y.o. female, DOB - 22-Feb-1961, [id number]  Admit date - 02/09/2017   Admitting Physician Elwin Mocha, MD  Outpatient Primary MD for the patient is Eliezer Lofts, MD  LOS - 0  Outpatient Specialists:   Chief Complaint  Patient presents with  . Chest Pain       Brief Narrative  56 y.o. female with medical history significant for diabetes on insulin, hypothyroidism, GERD with recent diagnosis H. pylori infection, tobacco abuse, dyslipidemia and new dx of anxiety disorder recently started on Zofran. Patient reports that she awakened this morning with abdominal pain that later developed into burning substernal chest pain radiated up into the esophagus similar to reflux. She became cold and clammy and had nausea and vomiting of "foamy" substance. She has not had any fevers or diarrhea. She has had subjective chills. She's had progressive malaise and has not felt well since being diagnosed with H. pylori. She felt a severe headache yesterday. She has not had chest pain either at rest or with exertion, shortness of breath, dyspnea on exertion outside of today's symptoms.  ED Course:  Vital Signs: BP 94/65   Pulse 93   Temp 97.8 F (36.6 C) (Oral)   Resp 14   Ht 5\' 1"  (1.549 m)   Wt 58.5 kg (129 lb)   LMP 08/11/2012   SpO2 97%   BMI 24.37 kg/m  AAS: No acute findings Lab data: Sodium 133, potassium 3.8, chloride 96, CO2 20, glucose 414, BUN 13, creatinine 0.76, anion gap 17, LFTs not elevated, poc troponin 0.03, white count 19,900 differential not obtained, hemoglobin 16.3, platelets  371,000, urinalysis unremarkable except for greater than 500 glucose, 20 ketones, 30 protein, FOB negative Medications and treatments: 2 L NS boluses, insulin infusion   Subjective:  Braylea [place] today has minmal chest discomfort central chest area, no radiation of the pain.  Feeling overall better.  Pt denies fever, chills, cough, sob.  Pt is being followed by cardiology.    No headache, No abdominal pain - No Nausea, no Diarrhea, no Brbpr.     Assessment  & Plan :    Principal Problem:   DKA, type 2 ([place]) Active Problems:   Hypothyroidism   Hyperlipidemia   Generalized anxiety disorder   Irritable bowel syndrome   Chest pain   GERD (gastroesophageal reflux disease)   H. pylori infection   1. Chest pain (echo =EF 35-40%, hypokinesis of the mid-apical anterolateral and inferior myocardium, mild MR) Tele NPO after midnite Cath in AM or possibly earlier if having worsening pain Cont Heparin iv cont nitropaste Cont aspirin, cont lipitor, cont zetia  2. DKA Check bmp if bicarb normalizing then  Change to fsbs q4h, iss,   3. Hypothyroidism Cont levothyroxine  4. Gerd Cont protonix  5. Anxiety Cont zoloft    Code Status : FULL CODE  Family Communication  : w patient  Disposition Plan  : home  Barriers For Discharge :   Consults  :  cardiology  Procedures  : cardiac echo 02/09/2017  DVT Prophylaxis  :  Heparin    Lab Results  Component Value Date   PLT 287 02/10/2017    Antibiotics  :    Anti-infectives    Start     Dose/Rate Route Frequency Ordered Stop   02/09/17 1300  doxycycline (VIBRA-TABS) tablet 100 mg  Status:  Discontinued     100 mg Oral Every 12 hours 02/09/17 1249 02/09/17 2013   02/09/17 1230  metroNIDAZOLE (FLAGYL) tablet 375 mg  Status:  Discontinued     375 mg Oral 3 times daily before meals & bedtime 02/09/17 1203 02/09/17 2011   02/09/17 1230  tetracycline (ACHROMYCIN,SUMYCIN) capsule 500 mg  Status:  Discontinued     500 mg Oral 3  times daily before meals & bedtime 02/09/17 1203 02/09/17 1245        Objective:   Vitals:   02/09/17 1853 02/09/17 1902 02/09/17 1952 02/10/17 0613  BP: (!) 132/106 125/75 (!) 125/91 (!) 90/56  Pulse:   91 85  Resp:   20 20  Temp:  97.8 F (36.6 C) 98.6 F (37 C) 98.9 F (37.2 C)  TempSrc:  Oral Oral Oral  SpO2:   100% 96%  Weight:      Height:        Wt Readings from Last 3 Encounters:  02/09/17 58.5 kg (129 lb)  02/05/17 58.6 kg (129 lb 4 oz)  01/25/17 58.1 kg (128 lb)     Intake/Output Summary (Last 24 hours) at 02/10/17 1249 Last data filed at 02/10/17 0300  Gross per 24 hour  Intake          2176.08 ml  Output                0 ml  Net          2176.08 ml     Physical Exam  Awake Alert, Oriented X 3, No new F.N deficits, Normal affect [place].AT,PERRAL Supple Neck,No JVD, No cervical lymphadenopathy appriciated.  Symmetrical Chest wall movement, Good air movement bilaterally, CTAB Slight chest wall tenderness with palpiation RRR,No Gallops,Rubs or new Murmurs, No Parasternal Heave +ve B.Sounds, Abd Soft, No tenderness, No organomegaly appriciated, No rebound - guarding or rigidity. No Cyanosis, Clubbing or edema,  No new Rash or bruise      Data Review:    CBC  Recent Labs Lab 02/09/17 0625 02/10/17 0208  WBC 19.9* 16.0*  HGB 16.3* 13.5  HCT 45.8 39.3  PLT 371 287  MCV 83.6 84.7  MCH 29.7 29.1  MCHC 35.6 34.4  RDW 13.7 14.2    Chemistries   Recent Labs Lab 02/09/17 0625 02/09/17 0738 02/09/17 0958 02/09/17 1952 02/10/17 0208  NA 133*  --  137 137  --   K 3.8  --  3.5 3.1*  --   CL 96*  --  103 105  --   CO2 20*  --  22 20*  --   GLUCOSE 414*  --  285* 152*  --   BUN 13  --  11 10  --   CREATININE 0.76  --  0.79 0.67  --   CALCIUM 9.6  --  9.1 8.9  --   MG  --   --  1.4*  --  2.0  AST  --  18  --   --   --   ALT  --  12*  --   --   --   ALKPHOS  --  63  --   --   --   BILITOT  --  0.6  --   --   --     ------------------------------------------------------------------------------------------------------------------ No results for input(s): CHOL, HDL, LDLCALC, TRIG, CHOLHDL, LDLDIRECT in the last 72 hours.  Lab Results  Component Value Date   HGBA1C 12.3 (H) 01/22/2017   ------------------------------------------------------------------------------------------------------------------ No results for input(s): TSH, T4TOTAL, T3FREE, THYROIDAB in the last 72 hours.  Invalid input(s): FREET3 ------------------------------------------------------------------------------------------------------------------ No results for input(s): VITAMINB12, FOLATE, FERRITIN, TIBC, IRON, RETICCTPCT in the last 72 hours.  Coagulation profile No results for input(s): INR, PROTIME in the last 168 hours.  No results for input(s): DDIMER in the last 72 hours.  Cardiac Enzymes  Recent Labs Lab 02/09/17 0958 02/09/17 1510 02/09/17 1952  TROPONINI 0.20* 0.31* 0.23*   ------------------------------------------------------------------------------------------------------------------ No results found for: BNP  Inpatient Medications  Scheduled Meds: . atorvastatin  80 mg Oral q1800  . ezetimibe  10 mg Oral Daily  . insulin glargine  50 Units Subcutaneous Daily  . levothyroxine  12.5 mcg Oral QAC breakfast  . nitroGLYCERIN  0.5 inch Topical Q6H  . pantoprazole (PROTONIX) IV  40 mg Intravenous Q24H  . potassium chloride  30 mEq Oral Once  . sertraline  50 mg Oral Daily   Continuous Infusions: . sodium chloride 100 mL/hr at 02/09/17 2021  . heparin 650 Units/hr (02/09/17 1650)   PRN Meds:.nitroGLYCERIN, ondansetron (ZOFRAN) IV  Micro Results No results found for this or any previous visit (from the past 240 hour(s)).  Radiology Reports Dg Abd Acute W/chest  Result Date: 02/09/2017 CLINICAL DATA:  Mid chest pain and diffuse abdominal pain. Nausea and vomiting. EXAM: DG ABDOMEN ACUTE W/ 1V CHEST  COMPARISON:  Abdominal radiograph 09/11/2016. Chest radiographs 07/24/2016. FINDINGS: The cardiomediastinal silhouette is within normal limits. The lungs are well inflated and clear. No pleural effusion or pneumothorax is identified. There is no evidence of intraperitoneal free air. There is a paucity of bowel gas which limits evaluation for obstruction. No dilated loops of bowel are seen, and no air-fluid levels are evident. A small amount of stool is present in the colon. No acute osseous abnormality is identified. IMPRESSION: 1. No evidence of acute cardiopulmonary disease. 2. No acute abnormality identified in the abdomen. Electronically  Signed   By: Logan Bores M.D.   On: 02/09/2017 08:26    Time Spent in minutes  30   Jani Gravel M.D on 02/10/2017 at 12:49 PM  Between 7am to 7pm - Pager - [phone]  After 7pm go to [email or web address] - password [facility]  [facility] -  Office  [phone]

## 2017-02-10 NOTE — Progress Notes (Signed)
ANTICOAGULATION CONSULT NOTE - Follow Up Consult  Pharmacy Consult for Heparin Indication: chest pain/ACS  Allergies  Allergen Reactions  . Codeine Hives, Anxiety and Other (See Comments)    Patient Measurements: Height: 5\' 1"  (154.9 cm) Weight: 129 lb (58.5 kg) IBW/kg (Calculated) : 47.8 Heparin Dosing Weight:  58.5 kg  Vital Signs: Temp: 97.6 F (36.4 C) (03/04 1400) Temp Source: Oral (03/04 1400) BP: 97/64 (03/04 1400) Pulse Rate: 77 (03/04 1400)  Labs:  Recent Labs  02/09/17 0625 02/09/17 0958 02/09/17 1510 02/09/17 1952 02/09/17 2355 02/10/17 0208 02/10/17 1228 02/10/17 1429  HGB 16.3*  --   --   --   --  13.5  --   --   HCT 45.8  --   --   --   --  39.3  --   --   PLT 371  --   --   --   --  287  --   --   HEPARINUNFRC  --   --   --   --  0.39 0.33  --  0.26*  CREATININE 0.76 0.79  --  0.67  --   --  0.64  --   TROPONINI  --  0.20* 0.31* 0.23*  --   --   --   --     Estimated Creatinine Clearance: 66.9 mL/min (by C-G formula based on SCr of 0.64 mg/dL).   Assessment:  Anticoag: iv hep for r/o acs. HL 0.39, 0.33 (2 hrs later). Hgb 16.3>13.5. HL 0.26 down.  Goal of Therapy:  Heparin level 0.3-0.7 units/ml Monitor platelets by anticoagulation protocol: Yes   Plan:  Increase IV heparin to 750 units/hr Daily HL and CBC   [name] [name], PharmD, BCPS Clinical Staff Pharmacist Pager [phone]  [name] [name] 02/10/2017,3:36 PM

## 2017-02-11 ENCOUNTER — Encounter (HOSPITAL_COMMUNITY): Payer: Self-pay | Admitting: Cardiovascular Disease

## 2017-02-11 ENCOUNTER — Encounter (HOSPITAL_COMMUNITY)
Admission: EM | Disposition: A | Discharge status: Home / Self Care | Payer: Self-pay | Source: Home / Self Care | Attending: Family Medicine

## 2017-02-11 DIAGNOSIS — E1310 Other specified diabetes mellitus with ketoacidosis without coma: Secondary | ICD-10-CM

## 2017-02-11 DIAGNOSIS — I200 Unstable angina: Secondary | ICD-10-CM

## 2017-02-11 DIAGNOSIS — I214 Non-ST elevation (NSTEMI) myocardial infarction: Secondary | ICD-10-CM | POA: Diagnosis not present

## 2017-02-11 DIAGNOSIS — R748 Abnormal levels of other serum enzymes: Secondary | ICD-10-CM

## 2017-02-11 DIAGNOSIS — F411 Generalized anxiety disorder: Secondary | ICD-10-CM

## 2017-02-11 DIAGNOSIS — E039 Hypothyroidism, unspecified: Secondary | ICD-10-CM | POA: Diagnosis not present

## 2017-02-11 DIAGNOSIS — Z794 Long term (current) use of insulin: Secondary | ICD-10-CM | POA: Diagnosis not present

## 2017-02-11 HISTORY — PX: LEFT HEART CATH AND CORONARY ANGIOGRAPHY: CATH118249

## 2017-02-11 LAB — COMPREHENSIVE METABOLIC PANEL
ALK PHOS: 41 U/L (ref 38–126)
ALT: 12 U/L — AB (ref 14–54)
AST: 18 U/L (ref 15–41)
Albumin: 2.9 g/dL — ABNORMAL LOW (ref 3.5–5.0)
Anion gap: 7 (ref 5–15)
CALCIUM: 8.5 mg/dL — AB (ref 8.9–10.3)
CHLORIDE: 107 mmol/L (ref 101–111)
CO2: 25 mmol/L (ref 22–32)
CREATININE: 0.6 mg/dL (ref 0.44–1.00)
GFR calc non Af Amer: >60 mL/min (ref >60)
Glucose, Bld: 63 mg/dL — ABNORMAL LOW (ref 65–99)
Potassium: 3.2 mmol/L — ABNORMAL LOW (ref 3.5–5.1)
SODIUM: 139 mmol/L (ref 135–145)
Total Bilirubin: 0.5 mg/dL (ref 0.3–1.2)
Total Protein: 5.5 g/dL — ABNORMAL LOW (ref 6.5–8.1)

## 2017-02-11 LAB — CBC
HCT: 41.2 % (ref 36.0–46.0)
HEMOGLOBIN: 14 g/dL (ref 12.0–15.0)
MCH: 29.5 pg (ref 26.0–34.0)
MCHC: 34 g/dL (ref 30.0–36.0)
MCV: 86.7 fL (ref 78.0–100.0)
PLATELETS: 343 10*3/uL (ref 150–400)
RBC: 4.75 MIL/uL (ref 3.87–5.11)
RDW: 14.8 % (ref 11.5–15.5)
WBC: 15.5 10*3/uL — AB (ref 4.0–10.5)

## 2017-02-11 LAB — HEPARIN LEVEL (UNFRACTIONATED): Heparin Unfractionated: 0.45 IU/mL (ref 0.30–0.70)

## 2017-02-11 LAB — GLUCOSE, CAPILLARY
GLUCOSE-CAPILLARY: 149 mg/dL — AB (ref 65–99)
GLUCOSE-CAPILLARY: 95 mg/dL (ref 65–99)
Glucose-Capillary: 67 mg/dL (ref 65–99)
Glucose-Capillary: 72 mg/dL (ref 65–99)
Glucose-Capillary: 113 mg/dL — ABNORMAL HIGH (ref 65–99)
Glucose-Capillary: 61 mg/dL — ABNORMAL LOW (ref 65–99)

## 2017-02-11 SURGERY — LEFT HEART CATH AND CORONARY ANGIOGRAPHY
Anesthesia: LOCAL

## 2017-02-11 MED ORDER — SODIUM CHLORIDE 0.9% FLUSH: 3.0000 mL | Freq: Two times a day (BID) | INTRAVENOUS | Status: DC

## 2017-02-11 MED ORDER — DEXTROSE 50 % IV SOLN
INTRAVENOUS | Status: AC
  Filled 2017-02-11: qty 50

## 2017-02-11 MED ORDER — IOPAMIDOL (ISOVUE-370) INJECTION 76%
INTRAVENOUS | Status: AC
  Filled 2017-02-11: qty 100

## 2017-02-11 MED ORDER — MIDAZOLAM HCL 2 MG/2ML IJ SOLN
INTRAMUSCULAR | Status: AC
  Filled 2017-02-11: qty 2

## 2017-02-11 MED ORDER — FENTANYL CITRATE (PF) 100 MCG/2ML IJ SOLN
INTRAMUSCULAR | Status: AC
  Filled 2017-02-11: qty 2

## 2017-02-11 MED ORDER — MIDAZOLAM HCL 2 MG/2ML IJ SOLN
INTRAMUSCULAR | Status: DC | PRN
  Administered 2017-02-11: 1 mg via INTRAVENOUS

## 2017-02-11 MED ORDER — HEPARIN (PORCINE) IN NACL 2-0.9 UNIT/ML-% IJ SOLN
INTRAMUSCULAR | Status: DC | PRN
  Administered 2017-02-11: 1000 mL

## 2017-02-11 MED ORDER — ATORVASTATIN CALCIUM 80 MG PO TABS: 80.0000 mg | ORAL_TABLET | Freq: Every day | ORAL | Status: DC

## 2017-02-11 MED ORDER — LIDOCAINE HCL (PF) 1 % IJ SOLN
INTRAMUSCULAR | Status: DC | PRN
  Administered 2017-02-11: 24 mL
  Administered 2017-02-11: 3 mL

## 2017-02-11 MED ORDER — DEXTROSE 50 % IV SOLN
INTRAVENOUS | Status: AC
  Administered 2017-02-11: 50 mL
  Filled 2017-02-11: qty 50

## 2017-02-11 MED ORDER — ACETAMINOPHEN 325 MG PO TABS: 650.0000 mg | ORAL_TABLET | ORAL | Status: DC | PRN

## 2017-02-11 MED ORDER — HEPARIN SODIUM (PORCINE) 1000 UNIT/ML IJ SOLN
INTRAMUSCULAR | Status: AC
  Filled 2017-02-11: qty 1

## 2017-02-11 MED ORDER — SODIUM CHLORIDE 0.9% FLUSH: 3.0000 mL | INTRAVENOUS | Status: DC | PRN

## 2017-02-11 MED ORDER — ONDANSETRON HCL 4 MG/2ML IJ SOLN: 4.0000 mg | Freq: Four times a day (QID) | INTRAMUSCULAR | Status: DC | PRN

## 2017-02-11 MED ORDER — VERAPAMIL HCL 2.5 MG/ML IV SOLN
INTRAVENOUS | Status: AC
  Filled 2017-02-11: qty 2

## 2017-02-11 MED ORDER — IOPAMIDOL (ISOVUE-370) INJECTION 76%
INTRAVENOUS | Status: DC | PRN
  Administered 2017-02-11: 55 mL via INTRAVENOUS

## 2017-02-11 MED ORDER — HEPARIN (PORCINE) IN NACL 2-0.9 UNIT/ML-% IJ SOLN
INTRAMUSCULAR | Status: AC
  Filled 2017-02-11: qty 1000

## 2017-02-11 MED ORDER — LIDOCAINE HCL (PF) 1 % IJ SOLN
INTRAMUSCULAR | Status: AC
  Filled 2017-02-11: qty 30

## 2017-02-11 MED ORDER — SODIUM CHLORIDE 0.9 % IV SOLN
30.0000 meq | Freq: Once | INTRAVENOUS | Status: DC
  Filled 2017-02-11: qty 15

## 2017-02-11 MED ORDER — SODIUM CHLORIDE 0.9 % IV SOLN
INTRAVENOUS | Status: AC
  Administered 2017-02-11: 12:00:00 via INTRAVENOUS

## 2017-02-11 MED ORDER — ASPIRIN 81 MG PO CHEW
81.0000 mg | CHEWABLE_TABLET | Freq: Every day | ORAL | Status: DC
  Administered 2017-02-11: 81 mg via ORAL
  Filled 2017-02-11: qty 1

## 2017-02-11 MED ORDER — SODIUM CHLORIDE 0.9 % IV SOLN: 250.0000 mL | INTRAVENOUS | Status: DC | PRN

## 2017-02-11 MED ORDER — NITROGLYCERIN 1 MG/10 ML FOR IR/CATH LAB
INTRA_ARTERIAL | Status: AC
  Filled 2017-02-11: qty 10

## 2017-02-11 MED ORDER — FENTANYL CITRATE (PF) 100 MCG/2ML IJ SOLN
INTRAMUSCULAR | Status: DC | PRN
  Administered 2017-02-11 (×2): 25 ug via INTRAVENOUS

## 2017-02-11 MED ORDER — ASPIRIN 81 MG PO CHEW: 81.0000 mg | CHEWABLE_TABLET | Freq: Every day | ORAL | 0 refills | Status: DC

## 2017-02-11 MED ORDER — OMEPRAZOLE 40 MG PO CPDR: 40.0000 mg | DELAYED_RELEASE_CAPSULE | Freq: Two times a day (BID) | ORAL | 0 refills | Status: DC

## 2017-02-11 SURGICAL SUPPLY — 12 items
CATH INFINITI 5FR MULTPACK ANG (CATHETERS) ×2 IMPLANT
DEVICE CLOSURE MYNXGRIP 5F (Vascular Products) ×2 IMPLANT
GLIDESHEATH SLEND A-KIT 6F 22G (SHEATH) ×2 IMPLANT
GUIDEWIRE INQWIRE 1.5J.035X260 (WIRE) ×1 IMPLANT
INQWIRE 1.5J .035X260CM (WIRE) ×2
KIT HEART LEFT (KITS) ×2 IMPLANT
PACK CARDIAC CATHETERIZATION (CUSTOM PROCEDURE TRAY) ×2 IMPLANT
SHEATH PINNACLE 5F 10CM (SHEATH) ×2 IMPLANT
SYR MEDRAD MARK V 150ML (SYRINGE) ×2 IMPLANT
TRANSDUCER W/STOPCOCK (MISCELLANEOUS) ×2 IMPLANT
TUBING CIL FLEX 10 FLL-RA (TUBING) ×2 IMPLANT
WIRE EMERALD 3MM-J .035X150CM (WIRE) ×2 IMPLANT

## 2017-02-11 NOTE — Interval H&P Note (Signed)
Cath Lab Visit (complete for each Cath Lab visit)  Clinical Evaluation Leading to the Procedure:   ACS: Yes.    Non-ACS:    Anginal Classification: CCS III  Anti-ischemic medical therapy: No Therapy  Non-Invasive Test Results: No non-invasive testing performed  Prior CABG: No previous CABG      History and Physical Interval Note:  02/11/2017 11:02 AM  [name] [name]  has presented today for surgery, with the diagnosis of unstable angina  The various methods of treatment have been discussed with the patient and family. After consideration of risks, benefits and other options for treatment, the patient has consented to  Procedure(s): Left Heart Cath and Coronary Angiography (N/A) as a surgical intervention .  The patient's history has been reviewed, patient examined, no change in status, stable for surgery.  I have reviewed the patient's chart and labs.  Questions were answered to the patient's satisfaction.     Quay Burow

## 2017-02-11 NOTE — Discharge Instructions (Signed)

## 2017-02-11 NOTE — H&P (View-Only) (Signed)
    Subjective:  Patient still complaining of low level chest discomfort in the center of her chest. No shortness of breath. No worsening of her chest pain. States this is been present over the last 4-6 weeks.  Objective:  Vital Signs in the last 24 hours: Temp:  [97.6 F (36.4 C)-97.8 F (36.6 C)] 97.6 F (36.4 C) (03/05 0530) Pulse Rate:  [70-77] 70 (03/05 0530) Resp:  [20] 20 (03/05 0530) BP: (94-99)/(57-67) 99/67 (03/05 0530) SpO2:  [98 %-100 %] 100 % (03/05 0530) Weight:  [141 lb 3.2 oz (64 kg)] 141 lb 3.2 oz (64 kg) (03/05 0530)  Intake/Output from previous day: 03/04 0701 - 03/05 0700 In: -  Out: 200 [Urine:200]  Physical Exam: Pt is alert and oriented, NAD HEENT: normal Neck: JVP - normal Lungs: CTA bilaterally CV: RRR without murmur or gallop Abd: soft, NT, Positive BS, no hepatomegaly Ext: no C/C/E Skin: warm/dry no rash   Lab Results:  Recent Labs  02/10/17 0208 02/11/17 0124  WBC 16.0* 15.5*  HGB 13.5 14.0  PLT 287 343    Recent Labs  02/10/17 1228 02/11/17 0124  NA 139 139  K 3.7 3.2*  CL 107 107  CO2 25 25  GLUCOSE 120* 63*  BUN 9 <5*  CREATININE 0.64 0.60    Recent Labs  02/09/17 1510 02/09/17 1952  TROPONINI 0.31* 0.23*    Cardiac Studies: Cardiac catheterization pending  2-D echocardiogram: Study Conclusions  - Left ventricle: The cavity size was normal. Systolic function was   moderately reduced. The estimated ejection fraction was in the   range of 35% to 40%. Hypokinesis of the mid-apicalanterior,   lateral, and inferior myocardium. - Mitral valve: There was mild regurgitation.  Tele: Personally reviewed: Normal sinus rhythm. CV strip from the emergency department reviewed which showed one run of nonsustained VT  Assessment/Plan:  1. Non-ST elevation MI: Somewhat equivocal with prolonged and constant low-grade chest discomfort and mild troponin elevation. Prior heart catheterization 6 years ago demonstrated no  high-grade coronary obstruction. However, her current presentation includes segmental LV dysfunction, nonsustained VT, and she has had multiple ongoing cardiovascular risk factors including diabetes and tobacco abuse. I agree cardiac catheterization is clearly indicated. I have reviewed the risks, indications, and alternatives with the patient and her husband who is at the bedside. She is on the schedule for later today. Will continue IV heparin.  2. Diabetic ketoacidosis: Management per primary team. Note poor glycemic control with hemoglobin A1c last month of 12.3 mg/dL.  3. Hypotension: Unable to titrate cardiac medications because of low blood pressure. Patient does appear to be clinically stable at this time.   Sherren Mocha, M.D. 02/11/2017, 9:08 AM

## 2017-02-11 NOTE — Progress Notes (Signed)
Patient ID: [name], female   DOB: 1961-06-13, 56 y.o.   MRN: [id number]                                                                PROGRESS NOTE                                                                                                                                                                                                             Patient Demographics:    [name] [name], is a 56 y.o. female, DOB - 13-Mar-1961, [id number]  Admit date - 02/09/2017   Admitting Physician Elwin Mocha, MD  Outpatient Primary MD for the patient is Eliezer Lofts, MD  LOS - 0  Outpatient Specialists  Chief Complaint  Patient presents with  . Chest Pain       Brief Narrative  56 y.o.femalewith medical history significant for diabetes on insulin, hypothyroidism, GERD with recent diagnosis H. pylori infection, tobaccoabuse, dyslipidemia and new dx of anxiety disorder recently started on Zofran. Patient reports that she awakened this morning with abdominal pain that later developed into burning substernal chest pain radiated up into the esophagus similar to reflux. She became cold and clammy and had nausea and vomiting of "foamy" substance. She has not had any fevers or diarrhea. She has had subjective chills. She's had progressive malaise and has not felt well since being diagnosed with H. pylori. She felt a severe headache yesterday. She has not had chest pain either at rest or with exertion, shortness of breath, dyspnea on exertion outside of today's symptoms.  ED Course: Vital Signs: BP 94/65  Pulse 93  Temp 97.8 F (36.6 C) (Oral)  Resp 14  Ht 5\' 1"  (1.549 m)  Wt 58.5 kg (129 lb)  LMP 08/11/2012  SpO2 97%  BMI 24.37 kg/m  AAS:No acute findings Lab data: Sodium 133, potassium 3.8, chloride 96, CO2 20, glucose 414, BUN 13, creatinine 0.76, anion gap 17, LFTs not elevated, poctroponin 0.03, white count 19,900 differential not obtained, hemoglobin 16.3, platelets  371,000, urinalysis unremarkable except for greater than 500 glucose, 20 ketones, 30 protein, FOBnegative Medications and treatments:2 L NSboluses, insulin infusion   Subjective:    [name] [name] today still has slight chest discomfort in her chest, no radiation of pain.  Feeling  overall stable.  Pt denies fever, chills, cough, palp, sob.  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 pylori infection    1. Chest pain (echo =EF 35-40%, hypokinesis of the mid-apical anterolateral and inferior myocardium, mild MR) Tele NPO  Cath today Cont Heparin iv cont nitropaste Cont aspirin, cont lipitor, cont zetia Appreciate cardiology input  2. DKA Resolved On ssi Hold lantus tonight due to poor po intake due to cath  3. Hypothyroidism Cont levothyroxine  4. Gerd Cont protonix  5. Anxiety Cont zoloft    Code Status : FULL CODE  Family Communication  : w patient  Disposition Plan  : home  Barriers For Discharge :   Consults  :  cardiology  Procedures  : cardiac echo 02/09/2017  DVT Prophylaxis  :  Heparin    Lab Results  Component Value Date   PLT 343 02/11/2017    Antibiotics  :   Anti-infectives    Start     Dose/Rate Route Frequency Ordered Stop   02/09/17 1300  doxycycline (VIBRA-TABS) tablet 100 mg  Status:  Discontinued     100 mg Oral Every 12 hours 02/09/17 1249 02/09/17 2013   02/09/17 1230  metroNIDAZOLE (FLAGYL) tablet 375 mg  Status:  Discontinued     375 mg Oral 3 times daily before meals & bedtime 02/09/17 1203 02/09/17 2011   02/09/17 1230  tetracycline (ACHROMYCIN,SUMYCIN) capsule 500 mg  Status:  Discontinued     500 mg Oral 3 times daily before meals & bedtime 02/09/17 1203 02/09/17 1245         Objective:   Vitals:   02/10/17 0613 02/10/17 1400 02/10/17 2053 02/11/17 0530  BP: (!) 90/56 97/64 (!) 94/57 99/67  Pulse: 85 77 73 70  Resp: 20 20 20 20   Temp: 98.9 F (37.2 C) 97.6 F (36.4 C) 97.8 F (36.6 C) 97.6 F (36.4 C)  TempSrc: Oral Oral Oral Oral  SpO2: 96% 100% 98% 100%  Weight:    64 kg (141 lb 3.2 oz)  Height:        Wt Readings from Last 3 Encounters:  02/11/17 64 kg (141 lb 3.2 oz)  02/05/17 58.6 kg (129 lb 4 oz)  01/25/17 58.1 kg (128 lb)     Intake/Output Summary (Last 24 hours) at 02/11/17 0921 Last data filed at 02/10/17 1855  Gross per 24 hour  Intake                0 ml  Output              200 ml  Net             -200 ml     Physical Exam  Awake Alert, Oriented X 3, No new F.N deficits, Normal affect [place].AT,PERRAL Supple Neck,No JVD, No cervical lymphadenopathy appriciated.  Symmetrical Chest wall movement, Good air movement bilaterally, CTAB RRR, S1, S2, 1/6 sem apex, No Parasternal Heave +ve B.Sounds, Abd Soft, No tenderness, No organomegaly appriciated, No rebound - guarding or rigidity. No Cyanosis, Clubbing or edema, No new Rash or bruise   Slight chest tenderness with palpation     Data Review:    CBC  Recent Labs Lab 02/09/17 [phone]  02/10/17 0208 02/11/17 0124  WBC 19.9* 16.0* 15.5*  HGB 16.3* 13.5 14.0  HCT 45.8 39.3 41.2  PLT 371 287 343  MCV 83.6 84.7 86.7  MCH 29.7 29.1 29.5  MCHC 35.6 34.4 34.0  RDW 13.7 14.2 14.8    Chemistries   Recent Labs Lab 02/09/17 0625 02/09/17 0738 02/09/17 0958 02/09/17 1952 02/10/17 0208 02/10/17 1228 02/11/17 0124  NA 133*  --  137 137  --  139 139  K 3.8  --  3.5 3.1*  --  3.7 3.2*  CL 96*  --  103 105  --  107 107  CO2 20*  --  22 20*  --  25 25  GLUCOSE 414*  --  285* 152*  --  120* 63*  BUN 13  --  11 10  --  9 <5*  CREATININE 0.76  --  0.79 0.67  --  0.64 0.60  CALCIUM 9.6  --  9.1 8.9  --  8.7* 8.5*  MG  --   --  1.4*  --  2.0  --   --   AST  --  18  --   --   --    --  18  ALT  --  12*  --   --   --   --  12*  ALKPHOS  --  63  --   --   --   --  41  BILITOT  --  0.6  --   --   --   --  0.5   ------------------------------------------------------------------------------------------------------------------ No results for input(s): CHOL, HDL, LDLCALC, TRIG, CHOLHDL, LDLDIRECT in the last 72 hours.  Lab Results  Component Value Date   HGBA1C 12.3 (H) 01/22/2017   ------------------------------------------------------------------------------------------------------------------ No results for input(s): TSH, T4TOTAL, T3FREE, THYROIDAB in the last 72 hours.  Invalid input(s): FREET3 ------------------------------------------------------------------------------------------------------------------ No results for input(s): VITAMINB12, FOLATE, FERRITIN, TIBC, IRON, RETICCTPCT in the last 72 hours.  Coagulation profile  Recent Labs Lab 02/10/17 1530  INR 0.96    No results for input(s): DDIMER in the last 72 hours.  Cardiac Enzymes  Recent Labs Lab 02/09/17 0958 02/09/17 1510 02/09/17 1952  TROPONINI 0.20* 0.31* 0.23*   ------------------------------------------------------------------------------------------------------------------ No results found for: BNP  Inpatient Medications  Scheduled Meds: . atorvastatin  80 mg Oral q1800  . ezetimibe  10 mg Oral Daily  . insulin aspart  0-9 Units Subcutaneous TID WC  . insulin glargine  50 Units Subcutaneous Daily  . levothyroxine  12.5 mcg Oral QAC breakfast  . nitroGLYCERIN  0.5 inch Topical Q6H  . pantoprazole (PROTONIX) IV  40 mg Intravenous Q24H  . potassium chloride (KCL MULTIRUN) 30 mEq in 265 mL IVPB  30 mEq Intravenous Once  . sertraline  50 mg Oral Daily  . sodium chloride flush  3 mL Intravenous Q12H   Continuous Infusions: . sodium chloride 100 mL/hr at 02/11/17 0113  . sodium chloride 1 mL/kg/hr (02/11/17 0736)  . heparin 750 Units/hr (02/11/17 0112)   PRN Meds:.sodium  chloride, morphine injection, nitroGLYCERIN, ondansetron (ZOFRAN) IV, sodium chloride flush  Micro Results No results found for this or any previous visit (from the past 240 hour(s)).  Radiology Reports Dg Abd Acute W/chest  Result Date: 02/09/2017 CLINICAL DATA:  Mid chest pain and diffuse abdominal pain. Nausea and vomiting. EXAM: DG ABDOMEN ACUTE W/ 1V CHEST COMPARISON:  Abdominal radiograph 09/11/2016. Chest radiographs 07/24/2016. FINDINGS: The cardiomediastinal silhouette is within normal limits. The lungs are well inflated and clear.  No pleural effusion or pneumothorax is identified. There is no evidence of intraperitoneal free air. There is a paucity of bowel gas which limits evaluation for obstruction. No dilated loops of bowel are seen, and no air-fluid levels are evident. A small amount of stool is present in the colon. No acute osseous abnormality is identified. IMPRESSION: 1. No evidence of acute cardiopulmonary disease. 2. No acute abnormality identified in the abdomen. Electronically Signed   By: Logan Bores M.D.   On: 02/09/2017 08:26    Time Spent in minutes  30   Jani Gravel M.D on 02/11/2017 at 9:21 AM  Between 7am to 7pm - Pager - [phone]  After 7pm go to [email or web address] - password [facility]  [facility] -  Office  [phone]

## 2017-02-11 NOTE — Progress Notes (Signed)
Cath lab arrived to trasport, patient stated she was feeling shaky, rechecked blood sugar it was 61. Patient received amp of D50, called cath lab and made them aware. They said they would recheck her sugar when she was brought down.  Cyndia Bent  RN

## 2017-02-11 NOTE — Progress Notes (Signed)
Inpatient Diabetes Program Recommendations  AACE/ADA: New Consensus Statement on Inpatient Glycemic Control (2015)  Target Ranges:  Prepandial:   less than 140 mg/dL      Peak postprandial:   less than 180 mg/dL (1-2 hours)      Critically ill patients:  140 - 180 mg/dL   Lab Results  Component Value Date   GLUCAP 113 (H) 02/11/2017   HGBA1C 12.3 (H) 01/22/2017    Review of Glycemic Control Results for [name] [name], [name] [name] (MRN [id number]) as of 02/11/2017 08:26  Ref. Range 02/10/2017 16:30 02/10/2017 21:06 02/11/2017 06:26 02/11/2017 06:28 02/11/2017 07:29  Glucose-Capillary Latest Ref Range: 65 - 99 mg/dL 84 73 67 72 113 (H)   Diabetes history: DM Outpatient Diabetes medications: Metformin ER 1000 mg BID                             Basaglar (insulin glargine) 50 units QHS                             Humalog 15 units with small meal                             Humalog 20 units with a large meal Current orders for Inpatient glycemic control: Lantus 50 units qd + Novolog correction 0-9 units tid  Inpatient Diabetes Program Recommendations:  Noted patient for heart cath today and CBG 67 this am with treatment.  Please consider decrease in Lantus insulin to 25 units today. Will follow.  Thank you, [name] Gasser. [name] Senseney, RN, MSN, CDE Inpatient Glycemic Control Team Team Pager [phone] (8am-5pm) 02/11/2017 8:39 AM

## 2017-02-11 NOTE — Progress Notes (Signed)
    Subjective:  Patient still complaining of low level chest discomfort in the center of her chest. No shortness of breath. No worsening of her chest pain. States this is been present over the last 4-6 weeks.  Objective:  Vital Signs in the last 24 hours: Temp:  [97.6 F (36.4 C)-97.8 F (36.6 C)] 97.6 F (36.4 C) (03/05 0530) Pulse Rate:  [70-77] 70 (03/05 0530) Resp:  [20] 20 (03/05 0530) BP: (94-99)/(57-67) 99/67 (03/05 0530) SpO2:  [98 %-100 %] 100 % (03/05 0530) Weight:  [141 lb 3.2 oz (64 kg)] 141 lb 3.2 oz (64 kg) (03/05 0530)  Intake/Output from previous day: 03/04 0701 - 03/05 0700 In: -  Out: 200 [Urine:200]  Physical Exam: Pt is alert and oriented, NAD HEENT: normal Neck: JVP - normal Lungs: CTA bilaterally CV: RRR without murmur or gallop Abd: soft, NT, Positive BS, no hepatomegaly Ext: no C/C/E Skin: warm/dry no rash   Lab Results:  Recent Labs  02/10/17 0208 02/11/17 0124  WBC 16.0* 15.5*  HGB 13.5 14.0  PLT 287 343    Recent Labs  02/10/17 1228 02/11/17 0124  NA 139 139  K 3.7 3.2*  CL 107 107  CO2 25 25  GLUCOSE 120* 63*  BUN 9 <5*  CREATININE 0.64 0.60    Recent Labs  02/09/17 1510 02/09/17 1952  TROPONINI 0.31* 0.23*    Cardiac Studies: Cardiac catheterization pending  2-D echocardiogram: Study Conclusions  - Left ventricle: The cavity size was normal. Systolic function was   moderately reduced. The estimated ejection fraction was in the   range of 35% to 40%. Hypokinesis of the mid-apicalanterior,   lateral, and inferior myocardium. - Mitral valve: There was mild regurgitation.  Tele: Personally reviewed: Normal sinus rhythm. CV strip from the emergency department reviewed which showed one run of nonsustained VT  Assessment/Plan:  1. Non-ST elevation MI: Somewhat equivocal with prolonged and constant low-grade chest discomfort and mild troponin elevation. Prior heart catheterization 6 years ago demonstrated no  high-grade coronary obstruction. However, her current presentation includes segmental LV dysfunction, nonsustained VT, and she has had multiple ongoing cardiovascular risk factors including diabetes and tobacco abuse. I agree cardiac catheterization is clearly indicated. I have reviewed the risks, indications, and alternatives with the patient and her husband who is at the bedside. She is on the schedule for later today. Will continue IV heparin.  2. Diabetic ketoacidosis: Management per primary team. Note poor glycemic control with hemoglobin A1c last month of 12.3 mg/dL.  3. Hypotension: Unable to titrate cardiac medications because of low blood pressure. Patient does appear to be clinically stable at this time.   Sherren Mocha, M.D. 02/11/2017, 9:08 AM

## 2017-02-11 NOTE — Progress Notes (Signed)
Hypoglycemic Event  CBG: 1st- 67, 2nd- 72  Treatment: 37mL D50  Symptoms: Shaking, lightheaded  Follow-up CBG: Time:0730 CBG Result:113  Possible Reasons for Event: NPO status  Comments/MD notified:    Jaymes Graff

## 2017-02-11 NOTE — Discharge Summary (Addendum)
[name] [name], is a 56 y.o. female  DOB 1961-04-22  MRN [id number].  Admission date:  02/09/2017  Admitting Physician  Elwin Mocha, MD  Discharge Date:  02/11/2017   Primary MD  Eliezer Lofts, MD  Recommendations for primary care physician for things to follow:    1. Chest pain, NSTEMI (peak trop 0.31),  (echo =EF 35-40%, hypokinesis of the mid-apical anterolateral and inferior myocardium, mild MR) Cardiac cath 3/5=> normal coronaries,  EF 45%, Quay Burow) Started aspirin 81mg  po qday, cont lipitor, cont zetia.  No betablocker during this admission due to soft bp,  If bp rises, then consider carvedilol 3.125mg  po bid.   Pt will call Dr. Ezekiel Slocumb office for an appointment  2. DKA secondary to noncompliance (hga1c =12.3) Resolved Resume Basaglar 50 units [place] qhs Resume Humalog 10 unit [place] tid ac meals Resume Metformin tomorrow I would consider trulicity, please discuss with patient at visit   Hypokalemia Pt got po potassium today Please check bmp in 1 week  3. Hypothyroidism Cont levothyroxine  4. [name], H. Pylori  Resume omeprazole and also therapy for H. Pylori.   5. Anxiety Cont zoloft   Admission Diagnosis  Hyperglycemia [R73.9] Chest pain [R07.9] Diabetic ketoacidosis without coma associated with type 2 diabetes mellitus ([name]) [E13.10] Non-intractable vomiting with nausea, unspecified vomiting type [R11.2] DKA, type 2 ([place]) [E13.10]   Discharge Diagnosis  Hyperglycemia [R73.9] Chest pain [R07.9] Diabetic ketoacidosis without coma associated with type 2 diabetes mellitus ([place]) [E13.10] Non-intractable vomiting with nausea, unspecified vomiting type [R11.2] DKA, type 2 ([place]) [E13.10]    Principal Problem:   DKA, type 2 ([name]) Active Problems:   Hypothyroidism   Hyperlipidemia   Generalized anxiety disorder   Irritable bowel syndrome   Chest pain   GERD  (gastroesophageal reflux disease)   H. pylori infection   Cardiac enzymes elevated   NSTEMI (non-ST elevated myocardial infarction) [facility])      Past Medical History:  Diagnosis Date  . Anxiety   . B12 deficiency   . GERD (gastroesophageal reflux disease)   . Hyperlipidemia   . Hypertension    [name]  . Hypothyroidism   . IBS (irritable bowel syndrome)   . Intermittent vertigo   . 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 Leg cramping    "@ night" (09/18/2013)  . Migraine    "once q couple months" (09/18/2013)  . Osteoporosis   . Peripheral neuropathy ([place])   . Type II diabetes mellitus ([place])   . Umbilical hernia    unrepaired (09/18/2013)    Past Surgical History:  Procedure Laterality Date  . CESAREAN SECTION  1989  . LAPAROSCOPIC ASSISTED VAGINAL HYSTERECTOMY  09/23/2012   Procedure: LAPAROSCOPIC ASSISTED VAGINAL HYSTERECTOMY;  Surgeon: Linda Hedges, DO;  Location: [facility];  Service: Gynecology;  Laterality: N/A;  pull Dr Gregor Hams instrument  . LEFT HEART CATH AND CORONARY ANGIOGRAPHY N/A 02/11/2017   Procedure: Left Heart Cath and Coronary Angiography;  Surgeon: Lorretta Harp,  MD;  Location: [facility];  Service: Cardiovascular;  Laterality: N/A;  . TUBAL LIGATION Bilateral 1992  . VAGINAL HYSTERECTOMY  2013       HPI  from the history and physical done on the day of admission:     56 y.o.femalewith medical history significant for diabetes on insulin, hypothyroidism, GERD with recent diagnosis H. pylori infection, tobaccoabuse, dyslipidemia and new dx of anxiety disorder recently started on Zofran. Patient reports that she awakened this morning with abdominal pain that later developed into burning substernal chest pain radiated up into the esophagus similar to reflux. She became cold and clammy and had nausea and vomiting of "foamy" substance. She has not had any fevers or diarrhea. She has had subjective chills. She's had progressive  malaise and has not felt well since being diagnosed with H. pylori. She felt a severe headache yesterday. She has not had chest pain either at rest or with exertion, shortness of breath, dyspnea on exertion outside of today's symptoms.  ED Course: Vital Signs: BP 94/65  Pulse 93  Temp 97.8 F (36.6 C) (Oral)  Resp 14  Ht 5\' 1"  (1.549 m)  Wt 58.5 kg (129 lb)  LMP 08/11/2012  SpO2 97%  BMI 24.37 kg/m  AAS:No acute findings Lab data: Sodium 133, potassium 3.8, chloride 96, CO2 20, glucose 414, BUN 13, creatinine 0.76, anion gap 17, LFTs not elevated, poctroponin 0.03, white count 19,900 differential not obtained, hemoglobin 16.3, platelets 371,000, urinalysis unremarkable except for greater than 500 glucose, 20 ketones, 30 protein, FOBnegative Medications and treatments:2 L NSboluses, insulin infusion    Hospital Course:     Pt was admitted for cp and dka secondary to noncompliance with medication because she just forgot.  Pt was stabilized via NS iv and aggressive hydration as well as iv insulin.  Pt was transitioned to SSI after AG normalized, and Hco3 normalized.  Pt continued to have cp, and remained on heparin for NSTEMI  (peak trop 0.31),  Pt had cardiac echo that showed Mild MR, and EF 35-40%.  Pt had cardiac catherization 02/11/2017=> EF 45%, and normal coronaries by Quay Burow,  Pt has reproducible chest pain to palpation.  Pt is not having any discomfort at this time.  Note no b blocker used during admission due to soft bp.  Pt is stable and requests discharge to home.     Follow UP  Follow-up Information    Eliezer Lofts, MD Follow up in 1 week(s).   Specialty:  Family Medicine Contact information: [place] [address] [phone]        Ezzard Standing, MD Follow up in 2 week(s).   Specialty:  Cardiology Contact information: [place] [address] [phone]            Consults obtained  - cardiology  Discharge Condition: stable  Diet and Activity recommendation: See Discharge Instructions below  Discharge Instructions         Discharge Medications     Allergies as of 02/11/2017      Reactions   Codeine Hives, Anxiety, Other (See Comments)      Medication List    TAKE these medications   aspirin 81 MG chewable tablet Chew 1 tablet (81 mg total) by mouth daily. Start taking on:  02/12/2017   atorvastatin 80 MG tablet Commonly known as:  LIPITOR Take 1 tablet (80 mg total) by mouth daily at 6 PM.   Nancee Liter  KWIKPEN 100 UNIT/ML Sopn INJECT 50 UNITS INTO THE SKIN DAILY AT 10 PM.   bismuth-metronidazole-tetracycline 140-125-125 MG capsule Commonly known as:  PYLERA Take 3 capsules by mouth 4 (four) times daily -  before meals and at bedtime.   ezetimibe 10 MG tablet Commonly known as:  ZETIA Take 1 tablet (10 mg total) by mouth daily.   HUMALOG KWIKPEN 100 UNIT/ML KiwkPen Generic drug:  insulin lispro INJECT10-12 UNITS INTO THE SKIN 3 (THREE) TIMES DAILY.   levothyroxine 25 MCG tablet Commonly known as:  SYNTHROID, LEVOTHROID Take 0.5 tablets (12.5 mcg total) by mouth daily before breakfast. In addition to 200 mcg daily for TDD 212.5 mcg   levothyroxine 200 MCG tablet Commonly known as:  SYNTHROID, LEVOTHROID TAKE 1 TABLET BY MOUTH DAILY TAKE ALONG WITH THE 12.5 MG DOSE. FOR TOTAL OF 212.5MCG   metFORMIN 500 MG 24 hr tablet Commonly known as:  GLUCOPHAGE-XR Take 2 tablets (1,000 mg total) by mouth 2 (two) times daily after a meal.   omeprazole 40 MG capsule Commonly known as:  PRILOSEC Take 1 capsule (40 mg total) by mouth 2 (two) times daily.   RELPAX 40 MG tablet Generic drug:  eletriptan ONE TABLET BY MOUTH AT ONSET OF HEADACHE. MAY REPEAT IN 2 HOURS IF HEADACHE PERSISTS OR RECURS.   sertraline 50 MG tablet Commonly known as:  ZOLOFT Take 1 tablet (50 mg total) by mouth daily. What changed:  when to take this       Major procedures  and Radiology Reports - PLEASE review detailed and final reports for all details, in brief -      Dg Abd Acute W/chest  Result Date: 02/09/2017 CLINICAL DATA:  Mid chest pain and diffuse abdominal pain. Nausea and vomiting. EXAM: DG ABDOMEN ACUTE W/ 1V CHEST COMPARISON:  Abdominal radiograph 09/11/2016. Chest radiographs 07/24/2016. FINDINGS: The cardiomediastinal silhouette is within normal limits. The lungs are well inflated and clear. No pleural effusion or pneumothorax is identified. There is no evidence of intraperitoneal free air. There is a paucity of bowel gas which limits evaluation for obstruction. No dilated loops of bowel are seen, and no air-fluid levels are evident. A small amount of stool is present in the colon. No acute osseous abnormality is identified. IMPRESSION: 1. No evidence of acute cardiopulmonary disease. 2. No acute abnormality identified in the abdomen. Electronically Signed   By: Logan Bores M.D.   On: 02/09/2017 08:26    Micro Results     No results found for this or any previous visit (from the past 240 hour(s)).     Today   Subjective    Laverne Gelinas today has no headache,no chest abdominal pain,no new weakness tingling or numbness, pt had cardiac cath=> normal coronaries,  feels much better wants to go home today.    Objective   Blood pressure 112/70, pulse 78, temperature 98 F (36.7 C), temperature source Oral, resp. rate 19, height 5\' 1"  (1.549 m), weight 64 kg (141 lb 3.2 oz), last menstrual period 08/11/2012, SpO2 97 %.   Intake/Output Summary (Last 24 hours) at 02/11/17 1814 Last data filed at 02/10/17 1855  Gross per 24 hour  Intake                0 ml  Output              200 ml  Net             -200 ml    Exam Awake Alert,  Oriented x 3, No new F.N deficits, Normal affect [place].AT,PERRAL Supple Neck,No JVD, No cervical lymphadenopathy appriciated.  Symmetrical Chest wall movement, Good air movement bilaterally, CTAB RRR,No  Gallops,Rubs or new Murmurs, No Parasternal Heave +ve B.Sounds, Abd Soft, Non tender, No organomegaly appriciated, No rebound -guarding or rigidity. No Cyanosis, Clubbing or edema, No new Rash or bruise Good pedal pulses,  No blue toes, no evidence of right groin hematoma, no bruit   Data Review   CBC w Diff: Lab Results  Component Value Date   WBC 15.5 (H) 02/11/2017   HGB 14.0 02/11/2017   HCT 41.2 02/11/2017   PLT 343 02/11/2017   LYMPHOPCT 30.5 09/11/2016   MONOPCT 5.8 09/11/2016   EOSPCT 1.2 09/11/2016   BASOPCT 0.3 09/11/2016    CMP: Lab Results  Component Value Date   NA 139 02/11/2017   K 3.2 (L) 02/11/2017   CL 107 02/11/2017   CO2 25 02/11/2017   BUN <5 (L) 02/11/2017   CREATININE 0.60 02/11/2017   PROT 5.5 (L) 02/11/2017   ALBUMIN 2.9 (L) 02/11/2017   BILITOT 0.5 02/11/2017   ALKPHOS 41 02/11/2017   AST 18 02/11/2017   ALT 12 (L) 02/11/2017  .   Total Time in preparing paper work, data evaluation and todays exam - 13 minutes  Jani Gravel M.D on 02/11/2017 at [facility]   Office  [phone]

## 2017-02-12 ENCOUNTER — Telehealth: Payer: Self-pay | Admitting: *Deleted

## 2017-02-12 MED FILL — Heparin Sodium (Porcine) Inj 1000 Unit/ML: INTRAMUSCULAR | Qty: 10 | Status: AC

## 2017-02-12 MED FILL — Verapamil HCl IV Soln 2.5 MG/ML: INTRAVENOUS | Qty: 2 | Status: AC

## 2017-02-12 NOTE — Telephone Encounter (Signed)
[name] [name], is a 56 y.o. female  DOB May 18, 1961  MRN [id number].  Admission date:  02/09/2017  Admitting Physician  Elwin Mocha, MD  Discharge Date:  02/11/2017   Primary MD  Eliezer Lofts, MD  Transition Care Management Follow-up Telephone Call   How have you been since you were released from the hospital? "okay"    Do you understand why you were in the hospital? yes   Do you understand the discharge instructions? yes   Where were you discharged to? Home   Items Reviewed:  Medications reviewed: yes  Allergies reviewed: yes  Dietary changes reviewed: yes  Referrals reviewed: yes   Functional Questionnaire:   Activities of Daily Living (ADLs):   She states they are independent in the following: ambulation, bathing and hygiene, feeding, continence, grooming, toileting and dressing States they require assistance with the following: none   Any transportation issues/concerns?: no   Any patient concerns? no   Confirmed importance and date/time of follow-up visits scheduled yes  Provider Appointment booked with Dr.Bedsole 02/15/17 @230   Confirmed with patient if condition begins to worsen call PCP or go to the ER.  Patient was given the office number and encouraged to call back with question or concerns.  : yes

## 2017-02-15 ENCOUNTER — Encounter: Payer: Self-pay | Admitting: Family Medicine

## 2017-02-15 ENCOUNTER — Ambulatory Visit (INDEPENDENT_AMBULATORY_CARE_PROVIDER_SITE_OTHER): Payer: 59 | Admitting: Family Medicine

## 2017-02-15 VITALS — BP 90/70 | HR 88 | Temp 97.8°F | Ht 61.0 in | Wt 132.5 lb

## 2017-02-15 DIAGNOSIS — E876 Hypokalemia: Secondary | ICD-10-CM

## 2017-02-15 DIAGNOSIS — I4891 Unspecified atrial fibrillation: Secondary | ICD-10-CM | POA: Diagnosis not present

## 2017-02-15 DIAGNOSIS — I214 Non-ST elevation (NSTEMI) myocardial infarction: Secondary | ICD-10-CM

## 2017-02-15 DIAGNOSIS — Z794 Long term (current) use of insulin: Secondary | ICD-10-CM

## 2017-02-15 DIAGNOSIS — A048 Other specified bacterial intestinal infections: Secondary | ICD-10-CM | POA: Diagnosis not present

## 2017-02-15 DIAGNOSIS — E1310 Other specified diabetes mellitus with ketoacidosis without coma: Secondary | ICD-10-CM | POA: Diagnosis not present

## 2017-02-15 DIAGNOSIS — E1110 Type 2 diabetes mellitus with ketoacidosis without coma: Secondary | ICD-10-CM

## 2017-02-15 NOTE — Assessment & Plan Note (Signed)
Trulicity as recommended by hospitalist not clearly great choice given pt with current early satiety and nausea. Encouraged her to work on compliance with diet and meds. Keep follow up with ENDO as planned.

## 2017-02-15 NOTE — Assessment & Plan Note (Signed)
Repeat BMET in 1 week for discharge.

## 2017-02-15 NOTE — Patient Instructions (Addendum)
Call GI to let them know stopped Hypylori treatment and to set up follow. I recommend restarting the HPylori treatment.   Follow up as planned with ENDO and Cardiology.

## 2017-02-15 NOTE — Progress Notes (Signed)
Pre visit review using our clinic review tool, if applicable. No additional management support is needed unless otherwise documented below in the visit note. 

## 2017-02-15 NOTE — Assessment & Plan Note (Signed)
Encouraged pt to complete treatment course. Restart. If unable to given SE.. Call GI to notify.

## 2017-02-15 NOTE — Assessment & Plan Note (Signed)
Blood pressure remains to soft to start BBlocker.

## 2017-02-15 NOTE — Assessment & Plan Note (Signed)
Improved FBS, DKA resolved prior to discharge. Push fluids.

## 2017-02-15 NOTE — Progress Notes (Signed)
Subjective:    Patient ID: [name] [name], female    DOB: 26-Feb-1961, 56 y.o.   MRN: [id number]  HPI  56 year old female presents for hospital follow up for DKA, acute chest pain and follow up. Admitted on 3/3//18 Discharge: 02/11/17   Hospital discharged pulled from 3/5 note for summary: 1. Chest pain, NSTEMI (peak trop 0.31),  (echo =EF 35-40%, hypokinesis of the mid-apical anterolateral and inferior myocardium, mild MR) Cardiac cath 3/5=> normal coronaries,  EF 45%, Quay Burow) Started aspirin 81mg  po qday, cont lipitor, cont zetia.  No betablocker during this admission due to soft bp,  If bp rises, then consider carvedilol 3.125mg  po bid.   Pt will call Dr. Ezekiel Slocumb office for an appointment  2. DKA secondary to noncompliance (hga1c =12.3) Resolved Resume Basaglar 50 units [place] qhs Resume Humalog 10 unit [employer] tid ac meals Resume Metformin tomorrow I would consider trulicity, please discuss with patient at visit   Hypokalemia Pt got po potassium today Please check bmp in 1 week  3. Hypothyroidism Cont levothyroxine  4. [name], H. Pylori  Resume omeprazole and also therapy for H. Pylori.   5. Anxiety Cont zoloft   Today she reports:  Contiued central chest pain, constantly.. Better with lying down. This pain was present throughout hospital and with discharg  No further indigestion.  No SOB, no dizziness.  She has follow up appt with Dr. Wynonia Lawman next week.   FBS has been 200 in AMs.  Has follow up 4/25 with ENDO.   Trulicity does not seem ideal given pt early satiety and chronic nausea.  She has stopped Hpylori treatment. Plans on following up with GI  Soon.   Social History /Family History/Past Medical History reviewed and updated if needed.  Blood pressure 90/70, pulse 88, temperature 97.8 F (36.6 C), temperature source Oral, height 5\' 1"  (1.549 m), weight 132 lb 8 oz (60.1 kg), last menstrual period 08/11/2012.  Review of Systems  Constitutional: Negative  for fatigue.  HENT: Negative for ear pain and postnasal drip.   Respiratory: Negative for cough and shortness of breath.   Cardiovascular: Positive for chest pain. Negative for palpitations and leg swelling.  Gastrointestinal: Positive for abdominal distention and nausea. Negative for abdominal pain.       Objective:   Physical Exam  Constitutional: Vital signs are normal. She appears well-developed and well-nourished. She is cooperative.  Non-toxic appearance. She does not appear ill. No distress.  HENT:  Head: Normocephalic.  Right Ear: Hearing, tympanic membrane, external ear and ear canal normal. Tympanic membrane is not erythematous, not retracted and not bulging.  Left Ear: Hearing, tympanic membrane, external ear and ear canal normal. Tympanic membrane is not erythematous, not retracted and not bulging.  Nose: No mucosal edema or rhinorrhea. Right sinus exhibits no maxillary sinus tenderness and no frontal sinus tenderness. Left sinus exhibits no maxillary sinus tenderness and no frontal sinus tenderness.  Mouth/Throat: Uvula is midline, oropharynx is clear and moist and mucous membranes are normal.  Eyes: Conjunctivae, EOM and lids are normal. Pupils are equal, round, and reactive to light. Lids are everted and swept, no foreign bodies found.  Neck: Trachea normal and normal range of motion. Neck supple. Carotid bruit is not present. No thyroid mass and no thyromegaly present.  Cardiovascular: Normal rate, regular rhythm, S1 normal, S2 normal, normal heart sounds, intact distal pulses and normal pulses.  Exam reveals no gallop and no friction rub.   No murmur heard. Pulmonary/Chest: Effort  normal and breath sounds normal. No tachypnea. No respiratory distress. She has no decreased breath sounds. She has no wheezes. She has no rhonchi. She has no rales.  Abdominal: Soft. Normal appearance and bowel sounds are normal. There is no tenderness. There is no rigidity, no guarding and no CVA  tenderness.  Neurological: She is alert.  Skin: Skin is warm, dry and intact. No rash noted.  Psychiatric: Her speech is normal and behavior is normal. Judgment and thought content normal. Her mood appears not anxious. Cognition and memory are normal. She does not exhibit a depressed mood.          Assessment & Plan:

## 2017-02-22 ENCOUNTER — Other Ambulatory Visit (INDEPENDENT_AMBULATORY_CARE_PROVIDER_SITE_OTHER): Payer: 59

## 2017-02-22 DIAGNOSIS — Z794 Long term (current) use of insulin: Secondary | ICD-10-CM | POA: Diagnosis not present

## 2017-02-22 DIAGNOSIS — E1310 Other specified diabetes mellitus with ketoacidosis without coma: Secondary | ICD-10-CM | POA: Diagnosis not present

## 2017-02-22 DIAGNOSIS — E876 Hypokalemia: Secondary | ICD-10-CM

## 2017-02-22 DIAGNOSIS — E1110 Type 2 diabetes mellitus with ketoacidosis without coma: Secondary | ICD-10-CM

## 2017-02-22 LAB — BASIC METABOLIC PANEL
BUN: 16 mg/dL (ref 6–23)
CALCIUM: 9.5 mg/dL (ref 8.4–10.5)
CO2: 26 meq/L (ref 19–32)
CREATININE: 0.75 mg/dL (ref 0.40–1.20)
Chloride: 95 mEq/L — ABNORMAL LOW (ref 96–112)
GFR: 85.7 mL/min (ref >60.00)
GLUCOSE: 346 mg/dL — AB (ref 70–99)
Potassium: 4.4 mEq/L (ref 3.5–5.1)
Sodium: 128 mEq/L — ABNORMAL LOW (ref 135–145)

## 2017-03-14 ENCOUNTER — Encounter: Payer: 59 | Admitting: Family Medicine

## 2017-03-19 ENCOUNTER — Other Ambulatory Visit: Payer: Self-pay | Admitting: Family Medicine

## 2017-03-19 NOTE — Telephone Encounter (Signed)
Yes pt should be taking twice daily not once daily given esophagitis and HPylori. Let pt know

## 2017-03-19 NOTE — Telephone Encounter (Signed)
[name] [name] notified to continue taking omeprazole twice a day, as discharged home on, from hospital.

## 2017-03-19 NOTE — Telephone Encounter (Signed)
Last office visit 02/15/2017.  Refill request in for one tablet daily.  Looks like at last hospital discharge she was instructed to take one capsule twice a day.  Ok to refill? Twice a day dosage?

## 2017-03-22 ENCOUNTER — Observation Stay (HOSPITAL_COMMUNITY)
Admission: EM | Admit: 2017-03-22 | Discharge: 2017-03-23 | Disposition: A | Discharge status: Home / Self Care | Payer: 59 | Attending: Internal Medicine | Admitting: Internal Medicine

## 2017-03-22 ENCOUNTER — Observation Stay (HOSPITAL_COMMUNITY): Payer: 59

## 2017-03-22 ENCOUNTER — Encounter (HOSPITAL_COMMUNITY): Payer: Self-pay | Admitting: *Deleted

## 2017-03-22 DIAGNOSIS — E1165 Type 2 diabetes mellitus with hyperglycemia: Secondary | ICD-10-CM

## 2017-03-22 DIAGNOSIS — F411 Generalized anxiety disorder: Secondary | ICD-10-CM | POA: Diagnosis not present

## 2017-03-22 DIAGNOSIS — I428 Other cardiomyopathies: Secondary | ICD-10-CM

## 2017-03-22 DIAGNOSIS — G43909 Migraine, unspecified, not intractable, without status migrainosus: Secondary | ICD-10-CM | POA: Insufficient documentation

## 2017-03-22 DIAGNOSIS — Z90710 Acquired absence of both cervix and uterus: Secondary | ICD-10-CM | POA: Diagnosis not present

## 2017-03-22 DIAGNOSIS — Z8249 Family history of ischemic heart disease and other diseases of the circulatory system: Secondary | ICD-10-CM | POA: Insufficient documentation

## 2017-03-22 DIAGNOSIS — I10 Essential (primary) hypertension: Secondary | ICD-10-CM | POA: Diagnosis not present

## 2017-03-22 DIAGNOSIS — E785 Hyperlipidemia, unspecified: Secondary | ICD-10-CM | POA: Diagnosis not present

## 2017-03-22 DIAGNOSIS — E1310 Other specified diabetes mellitus with ketoacidosis without coma: Secondary | ICD-10-CM | POA: Diagnosis not present

## 2017-03-22 DIAGNOSIS — K589 Irritable bowel syndrome without diarrhea: Secondary | ICD-10-CM | POA: Insufficient documentation

## 2017-03-22 DIAGNOSIS — Z820 Family history of epilepsy and other diseases of the nervous system: Secondary | ICD-10-CM | POA: Insufficient documentation

## 2017-03-22 DIAGNOSIS — Z87891 Personal history of nicotine dependence: Secondary | ICD-10-CM | POA: Insufficient documentation

## 2017-03-22 DIAGNOSIS — K920 Hematemesis: Secondary | ICD-10-CM | POA: Diagnosis not present

## 2017-03-22 DIAGNOSIS — Z9889 Other specified postprocedural states: Secondary | ICD-10-CM | POA: Diagnosis not present

## 2017-03-22 DIAGNOSIS — I429 Cardiomyopathy, unspecified: Secondary | ICD-10-CM | POA: Diagnosis not present

## 2017-03-22 DIAGNOSIS — D72829 Elevated white blood cell count, unspecified: Secondary | ICD-10-CM | POA: Diagnosis not present

## 2017-03-22 DIAGNOSIS — E876 Hypokalemia: Secondary | ICD-10-CM | POA: Diagnosis not present

## 2017-03-22 DIAGNOSIS — Z7982 Long term (current) use of aspirin: Secondary | ICD-10-CM | POA: Insufficient documentation

## 2017-03-22 DIAGNOSIS — E1142 Type 2 diabetes mellitus with diabetic polyneuropathy: Secondary | ICD-10-CM | POA: Insufficient documentation

## 2017-03-22 DIAGNOSIS — E039 Hypothyroidism, unspecified: Secondary | ICD-10-CM | POA: Diagnosis present

## 2017-03-22 DIAGNOSIS — I5022 Chronic systolic (congestive) heart failure: Secondary | ICD-10-CM | POA: Diagnosis not present

## 2017-03-22 DIAGNOSIS — I130 Hypertensive heart and chronic kidney disease with heart failure and stage 1 through stage 4 chronic kidney disease, or unspecified chronic kidney disease: Secondary | ICD-10-CM | POA: Insufficient documentation

## 2017-03-22 DIAGNOSIS — E1122 Type 2 diabetes mellitus with diabetic chronic kidney disease: Secondary | ICD-10-CM | POA: Insufficient documentation

## 2017-03-22 DIAGNOSIS — Z794 Long term (current) use of insulin: Secondary | ICD-10-CM | POA: Insufficient documentation

## 2017-03-22 DIAGNOSIS — E1110 Type 2 diabetes mellitus with ketoacidosis without coma: Secondary | ICD-10-CM | POA: Diagnosis present

## 2017-03-22 DIAGNOSIS — N182 Chronic kidney disease, stage 2 (mild): Secondary | ICD-10-CM | POA: Insufficient documentation

## 2017-03-22 DIAGNOSIS — Z885 Allergy status to narcotic agent status: Secondary | ICD-10-CM | POA: Insufficient documentation

## 2017-03-22 DIAGNOSIS — E1149 Type 2 diabetes mellitus with other diabetic neurological complication: Secondary | ICD-10-CM

## 2017-03-22 DIAGNOSIS — M810 Age-related osteoporosis without current pathological fracture: Secondary | ICD-10-CM | POA: Insufficient documentation

## 2017-03-22 DIAGNOSIS — R040 Epistaxis: Secondary | ICD-10-CM | POA: Diagnosis not present

## 2017-03-22 DIAGNOSIS — K219 Gastro-esophageal reflux disease without esophagitis: Secondary | ICD-10-CM | POA: Diagnosis not present

## 2017-03-22 LAB — CBC
HEMATOCRIT: 48.5 % — AB (ref 36.0–46.0)
HEMOGLOBIN: 17.4 g/dL — AB (ref 12.0–15.0)
MCH: 29.9 pg (ref 26.0–34.0)
MCHC: 35.9 g/dL (ref 30.0–36.0)
MCV: 83.5 fL (ref 78.0–100.0)
Platelets: 406 10*3/uL — ABNORMAL HIGH (ref 150–400)
RBC: 5.81 MIL/uL — ABNORMAL HIGH (ref 3.87–5.11)
RDW: 13.7 % (ref 11.5–15.5)
WBC: 23.2 10*3/uL — ABNORMAL HIGH (ref 4.0–10.5)

## 2017-03-22 LAB — URINALYSIS, ROUTINE W REFLEX MICROSCOPIC
Bilirubin Urine: NEGATIVE
Glucose, UA: >=500 mg/dL — AB
Hgb urine dipstick: NEGATIVE
Ketones, ur: 20 mg/dL — AB
LEUKOCYTES UA: NEGATIVE
Nitrite: NEGATIVE
PROTEIN: 100 mg/dL — AB
SPECIFIC GRAVITY, URINE: 1.032 — AB (ref 1.005–1.030)
pH: 5 (ref 5.0–8.0)

## 2017-03-22 LAB — TROPONIN I

## 2017-03-22 LAB — CBG MONITORING, ED
GLUCOSE-CAPILLARY: 382 mg/dL — AB (ref 65–99)
GLUCOSE-CAPILLARY: 326 mg/dL — AB (ref 65–99)
Glucose-Capillary: 330 mg/dL — ABNORMAL HIGH (ref 65–99)
Glucose-Capillary: 234 mg/dL — ABNORMAL HIGH (ref 65–99)
Glucose-Capillary: 201 mg/dL — ABNORMAL HIGH (ref 65–99)

## 2017-03-22 LAB — COMPREHENSIVE METABOLIC PANEL
ALBUMIN: 4.7 g/dL (ref 3.5–5.0)
ALT: 16 U/L (ref 14–54)
ANION GAP: 19 — AB (ref 5–15)
AST: 37 U/L (ref 15–41)
Alkaline Phosphatase: 77 U/L (ref 38–126)
BUN: 12 mg/dL (ref 6–20)
CHLORIDE: 105 mmol/L (ref 101–111)
CO2: 16 mmol/L — AB (ref 22–32)
Calcium: 10.3 mg/dL (ref 8.9–10.3)
Creatinine, Ser: 0.87 mg/dL (ref 0.44–1.00)
GFR calc Af Amer: >60 mL/min (ref >60)
GFR calc non Af Amer: >60 mL/min (ref >60)
GLUCOSE: 319 mg/dL — AB (ref 65–99)
POTASSIUM: 3 mmol/L — AB (ref 3.5–5.1)
SODIUM: 140 mmol/L (ref 135–145)
TOTAL PROTEIN: 8 g/dL (ref 6.5–8.1)
Total Bilirubin: 0.6 mg/dL (ref 0.3–1.2)

## 2017-03-22 LAB — GLUCOSE, CAPILLARY
Glucose-Capillary: 160 mg/dL — ABNORMAL HIGH (ref 65–99)
Glucose-Capillary: 181 mg/dL — ABNORMAL HIGH (ref 65–99)
Glucose-Capillary: 189 mg/dL — ABNORMAL HIGH (ref 65–99)

## 2017-03-22 LAB — TYPE AND SCREEN
ABO/RH(D): B NEG
ANTIBODY SCREEN: NEGATIVE

## 2017-03-22 LAB — MRSA PCR SCREENING: MRSA by PCR: NEGATIVE

## 2017-03-22 LAB — BETA-HYDROXYBUTYRIC ACID: Beta-Hydroxybutyric Acid: 0.56 mmol/L — ABNORMAL HIGH (ref 0.05–0.27)

## 2017-03-22 MED ORDER — SODIUM CHLORIDE 0.9 % IV SOLN
INTRAVENOUS | Status: DC
  Administered 2017-03-22: 3 [IU]/h via INTRAVENOUS
  Filled 2017-03-22: qty 2.5

## 2017-03-22 MED ORDER — SERTRALINE HCL 50 MG PO TABS
50.0000 mg | ORAL_TABLET | Freq: Every day | ORAL | Status: DC
  Administered 2017-03-22: 50 mg via ORAL
  Filled 2017-03-22: qty 1

## 2017-03-22 MED ORDER — ASPIRIN 81 MG PO CHEW
81.0000 mg | CHEWABLE_TABLET | Freq: Every day | ORAL | Status: DC
  Administered 2017-03-22 – 2017-03-23 (×2): 81 mg via ORAL
  Filled 2017-03-22 (×2): qty 1

## 2017-03-22 MED ORDER — SODIUM CHLORIDE 0.9 % IV SOLN
INTRAVENOUS | Status: DC
  Administered 2017-03-22: 3.2 [IU]/h via INTRAVENOUS
  Filled 2017-03-22: qty 2.5

## 2017-03-22 MED ORDER — LEVOTHYROXINE SODIUM 25 MCG PO TABS
12.5000 ug | ORAL_TABLET | Freq: Every day | ORAL | Status: DC
  Administered 2017-03-23: 12.5 ug via ORAL
  Filled 2017-03-22: qty 1

## 2017-03-22 MED ORDER — ONDANSETRON HCL 4 MG/2ML IJ SOLN: 4.0000 mg | Freq: Four times a day (QID) | INTRAMUSCULAR | Status: DC | PRN

## 2017-03-22 MED ORDER — DEXTROSE-NACL 5-0.45 % IV SOLN
INTRAVENOUS | Status: DC
  Administered 2017-03-22: 22:00:00 via INTRAVENOUS

## 2017-03-22 MED ORDER — LORAZEPAM 2 MG/ML IJ SOLN
1.0000 mg | Freq: Once | INTRAMUSCULAR | Status: AC
  Administered 2017-03-22: 1 mg via INTRAVENOUS
  Filled 2017-03-22: qty 1

## 2017-03-22 MED ORDER — SODIUM CHLORIDE 0.9 % IV BOLUS (SEPSIS)
1000.0000 mL | Freq: Once | INTRAVENOUS | Status: AC
  Administered 2017-03-22: 1000 mL via INTRAVENOUS

## 2017-03-22 MED ORDER — SODIUM CHLORIDE 0.9 % IV SOLN
30.0000 meq | Freq: Once | INTRAVENOUS | Status: AC
  Administered 2017-03-22: 30 meq via INTRAVENOUS
  Filled 2017-03-22: qty 15

## 2017-03-22 MED ORDER — LEVOTHYROXINE SODIUM 100 MCG PO TABS
200.0000 ug | ORAL_TABLET | Freq: Every day | ORAL | Status: DC
  Administered 2017-03-23: 200 ug via ORAL
  Filled 2017-03-22: qty 2

## 2017-03-22 MED ORDER — ONDANSETRON 4 MG PO TBDP
4.0000 mg | ORAL_TABLET | Freq: Once | ORAL | Status: DC
  Filled 2017-03-22: qty 1

## 2017-03-22 MED ORDER — PANTOPRAZOLE SODIUM 40 MG PO TBEC
40.0000 mg | DELAYED_RELEASE_TABLET | Freq: Two times a day (BID) | ORAL | Status: DC
  Administered 2017-03-23: 40 mg via ORAL
  Filled 2017-03-22: qty 1

## 2017-03-22 MED ORDER — ONDANSETRON HCL 4 MG PO TABS: 4.0000 mg | ORAL_TABLET | Freq: Four times a day (QID) | ORAL | Status: DC | PRN

## 2017-03-22 MED ORDER — DEXTROSE-NACL 5-0.45 % IV SOLN: INTRAVENOUS | Status: DC

## 2017-03-22 MED ORDER — ATORVASTATIN CALCIUM 80 MG PO TABS
80.0000 mg | ORAL_TABLET | Freq: Every day | ORAL | Status: DC
  Administered 2017-03-22: 80 mg via ORAL
  Filled 2017-03-22: qty 1

## 2017-03-22 MED ORDER — ONDANSETRON 4 MG PO TBDP
ORAL_TABLET | ORAL | Status: AC
  Filled 2017-03-22: qty 1

## 2017-03-22 MED ORDER — ENOXAPARIN SODIUM 40 MG/0.4ML ~~LOC~~ SOLN
40.0000 mg | SUBCUTANEOUS | Status: DC
  Administered 2017-03-22: 40 mg via SUBCUTANEOUS
  Filled 2017-03-22: qty 0.4

## 2017-03-22 MED ORDER — EZETIMIBE 10 MG PO TABS
10.0000 mg | ORAL_TABLET | Freq: Every day | ORAL | Status: DC
  Administered 2017-03-23: 10 mg via ORAL
  Filled 2017-03-22: qty 1

## 2017-03-22 MED ORDER — ACETAMINOPHEN 650 MG RE SUPP: 650.0000 mg | Freq: Four times a day (QID) | RECTAL | Status: DC | PRN

## 2017-03-22 MED ORDER — ACETAMINOPHEN 325 MG PO TABS
650.0000 mg | ORAL_TABLET | Freq: Four times a day (QID) | ORAL | Status: DC | PRN
  Administered 2017-03-22: 650 mg via ORAL
  Filled 2017-03-22: qty 2

## 2017-03-22 MED ORDER — SODIUM CHLORIDE 0.9 % IV SOLN
INTRAVENOUS | Status: DC
  Administered 2017-03-23: 02:00:00 via INTRAVENOUS

## 2017-03-22 MED ORDER — LORATADINE 10 MG PO TABS
10.0000 mg | ORAL_TABLET | Freq: Every day | ORAL | Status: DC
  Administered 2017-03-23: 10 mg via ORAL
  Filled 2017-03-22: qty 1

## 2017-03-22 NOTE — ED Notes (Signed)
Hemoccult card at the bedside  

## 2017-03-22 NOTE — H&P (Signed)
History and Physical  Patient Name: [name] [name]     ZYS:[id number]    DOB: 1961/06/03    DOA: 03/22/2017 PCP: Eliezer Lofts, MD   Patient coming from: Home  Chief Complaint: Malaise, vomiting, hematemesis vs  epistaxis  HPI: [name] [name] is a 56 y.o. female with a past medical history significant for IDDM, frequent DKA, hypothyroidism, NICM EF 35%, and recent H pylori now treated who presents with 1 day vomiting and malaise.  The patient was in her usual state of health until this week she's been very stressed out by her father-in-law's illness, her daughter's personal issues, and babysitting her grandson, and missed several doses of her insulin. Then this morning she woke up nauseous and vomited repeatedly all throughout the day. Her family tried giving her Sprite, insulin, and Zofran without relief, and then in the evening she was sweating, had episode where she vomited some "dark blood", and then blew her nose and there was bright red blood, so her husband called 9-1-1.  ED course: -Afebrile, heart rate 105, RR 24, BP 137/103, pulse ox normal -Na 140, K 3.0, Cr 0.87, WBC 23.2K, Hgb 17.4 -Troponin negative -BHOB weakly positive -Anion gap 19 and Bicarb 16 -Given 2L NS and started on insulin drip and TRH were asked to evaluate for DKA     ROS: Review of Systems  Constitutional: Positive for malaise/fatigue. Negative for chills and fever.  HENT:       Dental pain  Respiratory: Negative for cough, sputum production, shortness of breath and wheezing.   Cardiovascular: Negative for chest pain.  Gastrointestinal: Positive for nausea and vomiting. Negative for abdominal pain, blood in stool, constipation, diarrhea and melena.  Genitourinary: Negative for dysuria, flank pain, frequency, hematuria and urgency.  All other systems reviewed and are negative.         Past Medical History:  Diagnosis Date  . Anxiety   . B12 deficiency   . GERD (gastroesophageal reflux disease)   .  Hyperlipidemia   . Hypertension    [name]  . Hypothyroidism   . IBS (irritable bowel syndrome)   . Intermittent vertigo   . 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 Leg cramping    "@ night" (09/18/2013)  . Migraine    "once q couple months" (09/18/2013)  . Osteoporosis   . Peripheral neuropathy ([place])   . Type II diabetes mellitus ([place])   . Umbilical hernia    unrepaired (09/18/2013)    Past Surgical History:  Procedure Laterality Date  . CESAREAN SECTION  1989  . LAPAROSCOPIC ASSISTED VAGINAL HYSTERECTOMY  09/23/2012   Procedure: LAPAROSCOPIC ASSISTED VAGINAL HYSTERECTOMY;  Surgeon: Linda Hedges, DO;  Location: [facility];  Service: Gynecology;  Laterality: N/A;  pull Dr Gregor Hams instrument  . LEFT HEART CATH AND CORONARY ANGIOGRAPHY N/A 02/11/2017   Procedure: Left Heart Cath and Coronary Angiography;  Surgeon: Lorretta Harp, MD;  Location: [facility];  Service: Cardiovascular;  Laterality: N/A;  . TUBAL LIGATION Bilateral 1992  . VAGINAL HYSTERECTOMY  2013    Social History: Patient lives with her husband, son, grandson.  The patient walks unassisted.  She does not work.  She is a former smoker.  She is from [facility] originally.  Allergies  Allergen Reactions  . Codeine Hives, Anxiety and Other (See Comments)    Family history: family history includes Alzheimer's disease in her maternal grandmother; Coronary artery disease in her maternal grandmother; Diabetes in her  maternal grandmother and other; Heart attack in her maternal grandfather; Heart disease in her other; Heart failure in her brother and mother.  Prior to Admission medications   Medication Sig Start Date End Date Taking? Authorizing Provider  aspirin 81 MG chewable tablet Chew 1 tablet (81 mg total) by mouth daily. 02/12/17  Yes Jani Gravel, MD  atorvastatin (LIPITOR) 80 MG tablet Take 1 tablet (80 mg total) by mouth daily at 6 PM. 09/04/16  Yes Amy E Bedsole, MD  cetirizine (ZYRTEC)  10 MG tablet Take 10 mg by mouth at bedtime. 03/04/17  Yes Historical Provider, MD  ezetimibe (ZETIA) 10 MG tablet Take 1 tablet (10 mg total) by mouth daily. 09/11/16  Yes Amy Cletis Athens, MD  Insulin Glargine (BASAGLAR KWIKPEN) 100 UNIT/ML SOPN INJECT 50 UNITS INTO THE SKIN DAILY AT 10 PM. 12/20/16  Yes Philemon Kingdom, MD  insulin lispro (HUMALOG KWIKPEN) 100 UNIT/ML KiwkPen Inject 12-15 Units into the skin 3 (three) times daily.   Yes Historical Provider, MD  levothyroxine (SYNTHROID, LEVOTHROID) 200 MCG tablet TAKE 1 TABLET BY MOUTH DAILY TAKE ALONG WITH THE 12.5 MG DOSE. FOR TOTAL OF 212.5MCG 01/30/17  Yes Philemon Kingdom, MD  levothyroxine (SYNTHROID, LEVOTHROID) 25 MCG tablet Take 0.5 tablets (12.5 mcg total) by mouth daily before breakfast. In addition to 200 mcg daily for TDD 212.5 mcg Patient taking differently: Take 12.5 mcg by mouth See admin instructions. Take 1/2 tablet (12.5 mcg) by mouth with a 200 mcg tablet for a dose of 212.5 mcg 07/17/16  Yes Philemon Kingdom, MD  metFORMIN (GLUCOPHAGE-XR) 500 MG 24 hr tablet Take 2 tablets (1,000 mg total) by mouth 2 (two) times daily after a meal. Patient taking differently: Take 500 mg by mouth daily with breakfast.  09/07/16  Yes Philemon Kingdom, MD  omeprazole (PRILOSEC) 40 MG capsule Take 1 capsule (40 mg total) by mouth 2 (two) times daily. 03/19/17  Yes Amy E Bedsole, MD  ondansetron (ZOFRAN) 4 MG tablet Take 4 mg by mouth every 6 (six) hours as needed for nausea or vomiting.   Yes Historical Provider, MD  RELPAX 40 MG tablet ONE TABLET BY MOUTH AT ONSET OF HEADACHE. MAY REPEAT IN 2 HOURS IF HEADACHE PERSISTS OR RECURS. 10/17/16  Yes Amy Cletis Athens, MD  sertraline (ZOLOFT) 50 MG tablet Take 1 tablet (50 mg total) by mouth daily. Patient taking differently: Take 50 mg by mouth at bedtime.  02/05/17  Yes Amy Cletis Athens, MD       Physical Exam: BP 117/85   Pulse 98   Temp 97.3 F (36.3 C) (Oral)   Resp 18   LMP 08/11/2012   SpO2 100%    General appearance: Small adult female, alert and in mild distress from malaise.   Eyes: Anicteric, conjunctiva pink, lids and lashes normal. PERRL.    ENT: No nasal deformity, discharge, epistaxis.  Hearing normal. OP dry without lesions.  Edentulous.  I do not appreciate swelling or abscessed tooth where she indicates, and palpation of right jaw is not particularly tender. Neck: No neck masses.  Trachea midline.  No thyromegaly/tenderness. Lymph: No cervical or supraclavicular lymphadenopathy. Skin: Warm and dry.  No jaundice.  No suspicious rashes or lesions. Cardiac: Tachycardic, regular, nl S1-S2, no murmurs appreciated.  Capillary refill is brisk.  JVP not visible.  No LE edema.  Radial and DP pulses 2+ and symmetric. Respiratory: Normal respiratory rate and rhythm.  CTAB without rales or wheezes. Abdomen: Abdomen soft.  No TTP. No ascites,  distension, hepatosplenomegaly.   MSK: No deformities or effusions.  No cyanosis or clubbing. Neuro: Cranial nerves normal.  Sensation intact to light touch. Speech is fluent.  Muscle strength normal.    Psych: Sensorium intact and responding to questions, attention normal.  Behavior appropriate.  Affect normal.  Judgment and insight appear normal.     Labs on Admission:  I have personally reviewed following labs and imaging studies: CBC:  Recent Labs Lab 03/22/17 1359  WBC 23.2*  HGB 17.4*  HCT 48.5*  MCV 83.5  PLT 277*   Basic Metabolic Panel:  Recent Labs Lab 03/22/17 1359  NA 140  K 3.0*  CL 105  CO2 16*  GLUCOSE 319*  BUN 12  CREATININE 0.87  CALCIUM 10.3   GFR: CrCl cannot be calculated (Unknown ideal weight.).  Liver Function Tests:  Recent Labs Lab 03/22/17 1359  AST 37  ALT 16  ALKPHOS 77  BILITOT 0.6  PROT 8.0  ALBUMIN 4.7   No results for input(s): LIPASE, AMYLASE in the last 168 hours. No results for input(s): AMMONIA in the last 168 hours. Coagulation Profile: No results for input(s): INR, PROTIME in  the last 168 hours. Cardiac Enzymes:  Recent Labs Lab 03/22/17 1654  TROPONINI <0.03   BNP (last 3 results) No results for input(s): PROBNP in the last 8760 hours. HbA1C: No results for input(s): HGBA1C in the last 72 hours. CBG:  Recent Labs Lab 03/22/17 1509 03/22/17 1713 03/22/17 1816 03/22/17 1926  GLUCAP 330* 382* 326* 234*   Lipid Profile: No results for input(s): CHOL, HDL, LDLCALC, TRIG, CHOLHDL, LDLDIRECT in the last 72 hours. Thyroid Function Tests: No results for input(s): TSH, T4TOTAL, FREET4, T3FREE, THYROIDAB in the last 72 hours. Anemia Panel: No results for input(s): VITAMINB12, FOLATE, FERRITIN, TIBC, IRON, RETICCTPCT in the last 72 hours. Sepsis Labs: Invalid input(s): PROCALCITONIN, LACTICIDVEN No results found for this or any previous visit (from the past 240 hour(s)).       Radiological Exams on Admission: Personally reviewed: No results found.  EKG: Independently reviewed. Rate 97, QTc 498, RAD.  NO ST changes.  Echocardiogram 2018: Report reviewed EF 35-40% Regional wall motion abnormalities Mild MR  LHC report from 3/5: Normal coronaries    Assessment/Plan  1. Diabetic ketoacidosis in type 2 diabetes:  Precipitating factor appears to be lack of insulin, missed doses. Leukocytosis is impressive. Don't think there is a dental abscess.   -Insulin gtt -Serial BMP -IV fluids -Will not cycle enzymes given normal coronaries last month -Obtain chest x-ray -Follow-up urinalysis -Ondansetron for nausea (will need prescription at discharge) -Hold home insulins -Hold metformin   2. Leukocytosis:  No specific signs of infection at present, suspect this is reactive from vomiting. -Culture for fever  3. Hematemesis:  Patient complained of hematemesis and epistaxis. Given normal EGD in February, Pylera treatment and twice a day PPI adherence, doubt this is a clinically significant GI bleed. -If gastric occult blood is positive will  discuss with GI while here -Otherwise needs assistance getting GI follow-up (she was supposed to have an appointment this month with [facility] for routine f/u)  4. Nonischemic cardio myopathy:  Not on diuretics, appears hypovolemic.  5. Hypothyroidism:  -Continue levothyroxine  6. Other medications:  -Continue antihistamine -Continue sertraline  7. Hypokalemia:  Mild, supplemented in the ER per protocol.     DVT prophylaxis: Lovenox  Code Status: full  Family Communication: Husband at bedside  Disposition Plan: Anticipate insulin drip and convert back to subQ  insulin when gap closed.  Likely home tomorrow.   Consults called: None Admission status: OBS At the point of initial evaluation, it is my clinical opinion that admission for OBSERVATION is reasonable and necessary because the patient's presenting complaints in the context of their chronic conditions represent sufficient risk of deterioration or significant morbidity to constitute reasonable grounds for close observation in the hospital setting, but that the patient may be medically stable for discharge from the hospital within 24 to 48 hours.    Medical decision making: Patient seen at 7:40 PM on 03/22/2017.  The patient was discussed with Dr. Dayna Barker.  What exists of the patient's chart was reviewed in depth and summarized above.  Clinical condition: stable.        [name] [facility] Pager [phone]

## 2017-03-22 NOTE — ED Provider Notes (Signed)
[facility] DEPT Provider Note   CSN: [id number] Arrival date & time: 03/22/17  1339     History   Chief Complaint Chief Complaint  Patient presents with  . Nausea  . Emesis    HPI [name] [name] is a 56 y.o. female.  HPI  56 year old female history insulin-dependent diabetes, stage II chronic kidney disease, and NSTEMI presents to the emergency department today secondary to vomiting dark blood. Patient states" this morning she started vomiting dark blood and having coughing. This continued throughout the day she came here for further evaluation. She has some chest pain associated with this. No urinary symptoms. No fevers. No other associated or modifying symptoms.  Past Medical History:  Diagnosis Date  . Anxiety   . B12 deficiency   . GERD (gastroesophageal reflux disease)   . Hyperlipidemia   . Hypertension    [name]  . Hypothyroidism   . IBS (irritable bowel syndrome)   . Intermittent vertigo   . 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 Leg cramping    "@ night" (09/18/2013)  . Migraine    "once q couple months" (09/18/2013)  . Osteoporosis   . Peripheral neuropathy ([place])   . Type II diabetes mellitus ([place])   . Umbilical hernia    unrepaired (09/18/2013)    Patient Active Problem List   Diagnosis Date Noted  . DKA (diabetic ketoacidoses) ([place]) 03/22/2017  . Nonischemic cardiomyopathy ([place]) 03/22/2017  . Hypokalemia 02/15/2017  . NSTEMI (non-ST elevated myocardial infarction) ([place]) 02/11/2017  . Cardiac enzymes elevated   . Chest pain 02/09/2017  . GERD (gastroesophageal reflux disease) 02/09/2017  . H. pylori infection 02/09/2017  . Early satiety 01/22/2017  . DKA, type 2 ([place]) 07/24/2016  . Type 2 diabetes mellitus with ketoacidosis without coma, with long-term current use of insulin ([place]) 07/17/2016  . Atrial fibrillation ([place]) 02/01/2015  . 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 Hepatic lesion  09/18/2013  . Vitamin D deficiency 05/20/2012  . Essential hypertension 02/01/2011  . Osteoporosis 02/01/2011  . B12 deficiency 04/04/2010  . Peripheral neuropathy ([place]) 05/10/2009  . GERD 04/08/2009  . Generalized anxiety disorder 06/22/2008  . Hypothyroidism, acquired 04/29/2008  . Hyperlipidemia 04/29/2008  . Irritable bowel syndrome 04/29/2008    Past Surgical History:  Procedure Laterality Date  . CESAREAN SECTION  1989  . LAPAROSCOPIC ASSISTED VAGINAL HYSTERECTOMY  09/23/2012   Procedure: LAPAROSCOPIC ASSISTED VAGINAL HYSTERECTOMY;  Surgeon: Linda Hedges, DO;  Location: [facility];  Service: Gynecology;  Laterality: N/A;  pull Dr Gregor Hams instrument  . LEFT HEART CATH AND CORONARY ANGIOGRAPHY N/A 02/11/2017   Procedure: Left Heart Cath and Coronary Angiography;  Surgeon: Lorretta Harp, MD;  Location: [facility];  Service: Cardiovascular;  Laterality: N/A;  . TUBAL LIGATION Bilateral 1992  . VAGINAL HYSTERECTOMY  2013    OB History    Gravida Para Term Preterm AB Living   2 2           SAB TAB Ectopic Multiple Live Births                   Home Medications    Prior to Admission medications   Medication Sig Start Date End Date Taking? Authorizing Provider  aspirin 81 MG chewable tablet Chew 1 tablet (81 mg total) by mouth daily. 02/12/17  Yes Jani Gravel, MD  atorvastatin (LIPITOR) 80 MG tablet Take 1 tablet (80 mg total) by mouth daily at  6 PM. 09/04/16  Yes Amy Cletis Athens, MD  cetirizine (ZYRTEC) 10 MG tablet Take 10 mg by mouth at bedtime. 03/04/17  Yes Historical Provider, MD  ezetimibe (ZETIA) 10 MG tablet Take 1 tablet (10 mg total) by mouth daily. 09/11/16  Yes Amy Cletis Athens, MD  Insulin Glargine (BASAGLAR KWIKPEN) 100 UNIT/ML SOPN INJECT 50 UNITS INTO THE SKIN DAILY AT 10 PM. 12/20/16  Yes Philemon Kingdom, MD  insulin lispro (HUMALOG KWIKPEN) 100 UNIT/ML KiwkPen Inject 12-15 Units into the skin 3 (three) times daily.   Yes Historical Provider, MD  levothyroxine  (SYNTHROID, LEVOTHROID) 200 MCG tablet TAKE 1 TABLET BY MOUTH DAILY TAKE ALONG WITH THE 12.5 MG DOSE. FOR TOTAL OF 212.5MCG 01/30/17  Yes Philemon Kingdom, MD  levothyroxine (SYNTHROID, LEVOTHROID) 25 MCG tablet Take 0.5 tablets (12.5 mcg total) by mouth daily before breakfast. In addition to 200 mcg daily for TDD 212.5 mcg Patient taking differently: Take 12.5 mcg by mouth See admin instructions. Take 1/2 tablet (12.5 mcg) by mouth with a 200 mcg tablet for a dose of 212.5 mcg 07/17/16  Yes Philemon Kingdom, MD  metFORMIN (GLUCOPHAGE-XR) 500 MG 24 hr tablet Take 2 tablets (1,000 mg total) by mouth 2 (two) times daily after a meal. Patient taking differently: Take 500 mg by mouth daily with breakfast.  09/07/16  Yes Philemon Kingdom, MD  omeprazole (PRILOSEC) 40 MG capsule Take 1 capsule (40 mg total) by mouth 2 (two) times daily. 03/19/17  Yes Amy E Bedsole, MD  ondansetron (ZOFRAN) 4 MG tablet Take 4 mg by mouth every 6 (six) hours as needed for nausea or vomiting.   Yes Historical Provider, MD  RELPAX 40 MG tablet ONE TABLET BY MOUTH AT ONSET OF HEADACHE. MAY REPEAT IN 2 HOURS IF HEADACHE PERSISTS OR RECURS. 10/17/16  Yes Amy Cletis Athens, MD  sertraline (ZOLOFT) 50 MG tablet Take 1 tablet (50 mg total) by mouth daily. Patient taking differently: Take 50 mg by mouth at bedtime.  02/05/17  Yes Amy Cletis Athens, MD    Family History Family History  Problem Relation Age of Onset  . Diabetes Other   . Heart disease Other   . Heart failure Mother   . Diabetes Maternal Grandmother   . Alzheimer's disease Maternal Grandmother   . Coronary artery disease Maternal Grandmother   . Heart attack Maternal Grandfather   . Heart failure Brother     Social History Social History  Substance Use Topics  . Smoking status: Former Smoker    Years: 0.20    Types: Cigarettes    Quit date: 12/10/1990  . Smokeless tobacco: Never Used  . Alcohol use No     Allergies   Codeine   Review of Systems Review of  Systems  All other systems reviewed and are negative.    Physical Exam Updated Vital Signs BP 104/86 (BP Location: Left Arm)   Pulse 99   Temp 98.6 F (37 C) (Oral)   Resp 16   Ht 4\' 11"  (1.499 m)   Wt 128 lb 12 oz (58.4 kg)   LMP 08/11/2012   SpO2 99%   BMI 26.00 kg/m   Physical Exam  Constitutional: She appears well-developed and well-nourished.  HENT:  Head: Normocephalic and atraumatic.  Mouth/Throat: Mucous membranes are dry.  Eyes: Conjunctivae and EOM are normal.  Neck: Normal range of motion.  Cardiovascular: Regular rhythm.  Tachycardia present.   Pulmonary/Chest: No stridor. No respiratory distress.  Abdominal: She exhibits no distension. There is  tenderness (mild diffuse).  Neurological: She is alert.  Skin: Skin is warm and dry.  Nursing note and vitals reviewed.    ED Treatments / Results  Labs (all labs ordered are listed, but only abnormal results are displayed) Labs Reviewed  COMPREHENSIVE METABOLIC PANEL - Abnormal; Notable for the following:       Result Value   Potassium 3.0 (*)    CO2 16 (*)    Glucose, Bld 319 (*)    Anion gap 19 (*)    All other components within normal limits  CBC - Abnormal; Notable for the following:    WBC 23.2 (*)    RBC 5.81 (*)    Hemoglobin 17.4 (*)    HCT 48.5 (*)    Platelets 406 (*)    All other components within normal limits  URINALYSIS, ROUTINE W REFLEX MICROSCOPIC - Abnormal; Notable for the following:    Specific Gravity, Urine 1.032 (*)    Glucose, UA >=500 (*)    Ketones, ur 20 (*)    Protein, ur 100 (*)    Bacteria, UA RARE (*)    Squamous Epithelial / LPF 0-5 (*)    All other components within normal limits  BETA-HYDROXYBUTYRIC ACID - Abnormal; Notable for the following:    Beta-Hydroxybutyric Acid 0.56 (*)    All other components within normal limits  GLUCOSE, CAPILLARY - Abnormal; Notable for the following:    Glucose-Capillary 160 (*)    All other components within normal limits  GLUCOSE,  CAPILLARY - Abnormal; Notable for the following:    Glucose-Capillary 181 (*)    All other components within normal limits  GLUCOSE, CAPILLARY - Abnormal; Notable for the following:    Glucose-Capillary 189 (*)    All other components within normal limits  CBG MONITORING, ED - Abnormal; Notable for the following:    Glucose-Capillary 330 (*)    All other components within normal limits  CBG MONITORING, ED - Abnormal; Notable for the following:    Glucose-Capillary 382 (*)    All other components within normal limits  CBG MONITORING, ED - Abnormal; Notable for the following:    Glucose-Capillary 326 (*)    All other components within normal limits  CBG MONITORING, ED - Abnormal; Notable for the following:    Glucose-Capillary 234 (*)    All other components within normal limits  CBG MONITORING, ED - Abnormal; Notable for the following:    Glucose-Capillary 201 (*)    All other components within normal limits  MRSA PCR SCREENING  TROPONIN I  BASIC METABOLIC PANEL  BASIC METABOLIC PANEL  MAGNESIUM  POCT GASTRIC OCCULT BLOOD (1-CARD TO LAB)  TYPE AND SCREEN    EKG  EKG Interpretation  Date/Time:  Friday March 22 2017 16:57:41 EDT Ventricular Rate:  97 PR Interval:    QRS Duration: 97 QT Interval:  392 QTC Calculation: 498 R Axis:   95 Text Interpretation:  Sinus rhythm Borderline right axis deviation Borderline prolonged QT interval Confirmed by [facility] MD, Corene Cornea [phone]) on 03/22/2017 6:21:00 PM       Radiology No results found.  Procedures Procedures (including critical care time)  CRITICAL CARE Performed by: Merrily Pew Total critical care time: 35 minutes Critical care time was exclusive of separately billable procedures and treating other patients. Critical care was necessary to treat or prevent imminent or life-threatening deterioration. Critical care was time spent personally by me on the following activities: development of treatment plan with patient and/or  surrogate as  well as nursing, discussions with consultants, evaluation of patient's response to treatment, examination of patient, obtaining history from patient or surrogate, ordering and performing treatments and interventions, ordering and review of laboratory studies, ordering and review of radiographic studies, pulse oximetry and re-evaluation of patient's condition.   Medications Ordered in ED Medications  loratadine (CLARITIN) tablet 10 mg (not administered)  pantoprazole (PROTONIX) EC tablet 40 mg (not administered)  aspirin chewable tablet 81 mg (81 mg Oral Given 03/22/17 2249)  sertraline (ZOLOFT) tablet 50 mg (50 mg Oral Given 03/22/17 2249)  levothyroxine (SYNTHROID, LEVOTHROID) tablet 200 mcg (not administered)  ezetimibe (ZETIA) tablet 10 mg (not administered)  atorvastatin (LIPITOR) tablet 80 mg (80 mg Oral Given 03/22/17 2249)  levothyroxine (SYNTHROID, LEVOTHROID) tablet 12.5 mcg (not administered)  0.9 %  sodium chloride infusion (not administered)  dextrose 5 %-0.45 % sodium chloride infusion ( Intravenous New Bag/Given 03/22/17 2146)  insulin regular (NOVOLIN R,HUMULIN R) 250 Units in sodium chloride 0.9 % 250 mL (1 Units/mL) infusion (6.5 Units/hr Intravenous Rate/Dose Change 03/22/17 2350)  enoxaparin (LOVENOX) injection 40 mg (40 mg Subcutaneous Given 03/22/17 2249)  acetaminophen (TYLENOL) tablet 650 mg (650 mg Oral Given 03/22/17 2349)    Or  acetaminophen (TYLENOL) suppository 650 mg ( Rectal See Alternative 03/22/17 2349)  ondansetron (ZOFRAN) tablet 4 mg (not administered)    Or  ondansetron (ZOFRAN) injection 4 mg (not administered)  sodium chloride 0.9 % bolus 1,000 mL (0 mLs Intravenous Stopped 03/22/17 1750)  potassium chloride 30 mEq in sodium chloride 0.9 % 265 mL (KCL MULTIRUN) IVPB (30 mEq Intravenous Given 03/22/17 1711)  LORazepam (ATIVAN) injection 1 mg (1 mg Intravenous Given 03/22/17 1651)     Initial Impression / Assessment and Plan / ED Course  I have  reviewed the triage vital signs and the nursing notes.  Pertinent labs & imaging results that were available during my care of the patient were reviewed by me and considered in my medical decision making (see chart for details).    DKA but also hypokalemic. Will start K immediately. Will eval for causes for DKA but 'may have missed a couple doses' over the last couple days so likely related to noncompliance.   Final Clinical Impressions(s) / ED Diagnoses   Final diagnoses:  Diabetic ketoacidosis without coma associated with type 2 diabetes mellitus ([name])      Merrily Pew, MD 03/23/17 [phone]

## 2017-03-22 NOTE — ED Notes (Signed)
Admitting provider at bedside.

## 2017-03-22 NOTE — ED Notes (Signed)
Pt had episode of vomiting with approx 219ml of emesis.  Pt also coughing consistently.  Will attempt zofran.

## 2017-03-22 NOTE — ED Notes (Signed)
Charge Rn aware of WBC count, will attempt to room as soon as possible

## 2017-03-22 NOTE — ED Triage Notes (Signed)
Pt in from home via [facility], per report pt reports acute onset of hematemesis, v/d today, pt reports not having CBG machine, & took 12 units of Humalog, CBG with EMS 325, pt Type 1 diabetic, A&O x4

## 2017-03-23 DIAGNOSIS — E1110 Type 2 diabetes mellitus with ketoacidosis without coma: Secondary | ICD-10-CM

## 2017-03-23 DIAGNOSIS — F411 Generalized anxiety disorder: Secondary | ICD-10-CM | POA: Diagnosis not present

## 2017-03-23 DIAGNOSIS — I10 Essential (primary) hypertension: Secondary | ICD-10-CM | POA: Diagnosis not present

## 2017-03-23 DIAGNOSIS — Z794 Long term (current) use of insulin: Secondary | ICD-10-CM | POA: Diagnosis not present

## 2017-03-23 DIAGNOSIS — E039 Hypothyroidism, unspecified: Secondary | ICD-10-CM

## 2017-03-23 DIAGNOSIS — E876 Hypokalemia: Secondary | ICD-10-CM | POA: Diagnosis not present

## 2017-03-23 LAB — BASIC METABOLIC PANEL
Anion gap: 8 (ref 5–15)
Anion gap: 13 (ref 5–15)
BUN: 12 mg/dL (ref 6–20)
BUN: 13 mg/dL (ref 6–20)
CALCIUM: 9 mg/dL (ref 8.9–10.3)
CO2: 26 mmol/L (ref 22–32)
CO2: 20 mmol/L — AB (ref 22–32)
CREATININE: 0.59 mg/dL (ref 0.44–1.00)
CREATININE: 0.54 mg/dL (ref 0.44–1.00)
Calcium: 9 mg/dL (ref 8.9–10.3)
Chloride: 108 mmol/L (ref 101–111)
Chloride: 108 mmol/L (ref 101–111)
GFR calc Af Amer: >60 mL/min (ref >60)
GFR calc Af Amer: >60 mL/min (ref >60)
GFR calc non Af Amer: >60 mL/min (ref >60)
GFR calc non Af Amer: >60 mL/min (ref >60)
GLUCOSE: 186 mg/dL — AB (ref 65–99)
GLUCOSE: 173 mg/dL — AB (ref 65–99)
Potassium: 3.6 mmol/L (ref 3.5–5.1)
Potassium: 3 mmol/L — ABNORMAL LOW (ref 3.5–5.1)
Sodium: 142 mmol/L (ref 135–145)
Sodium: 141 mmol/L (ref 135–145)

## 2017-03-23 LAB — GLUCOSE, CAPILLARY
GLUCOSE-CAPILLARY: 128 mg/dL — AB (ref 65–99)
Glucose-Capillary: 107 mg/dL — ABNORMAL HIGH (ref 65–99)
Glucose-Capillary: 144 mg/dL — ABNORMAL HIGH (ref 65–99)
Glucose-Capillary: 184 mg/dL — ABNORMAL HIGH (ref 65–99)
Glucose-Capillary: 135 mg/dL — ABNORMAL HIGH (ref 65–99)

## 2017-03-23 LAB — MAGNESIUM: Magnesium: 1.7 mg/dL (ref 1.7–2.4)

## 2017-03-23 MED ORDER — INSULIN ASPART 100 UNIT/ML ~~LOC~~ SOLN
0.0000 [IU] | Freq: Three times a day (TID) | SUBCUTANEOUS | Status: DC
  Administered 2017-03-23: 1 [IU] via SUBCUTANEOUS
  Administered 2017-03-23: 2 [IU] via SUBCUTANEOUS

## 2017-03-23 MED ORDER — INSULIN GLARGINE 100 UNIT/ML ~~LOC~~ SOLN
10.0000 [IU] | Freq: Every day | SUBCUTANEOUS | Status: DC
Start: 2017-03-23 — End: 2017-03-23
  Administered 2017-03-23: 10 [IU] via SUBCUTANEOUS
  Filled 2017-03-23 (×2): qty 0.1

## 2017-03-23 NOTE — Plan of Care (Signed)
Problem: Education: Goal: Ability to identify and alter actions that are detrimental to health will improve Outcome: Not Progressing Pt able to identify actions and states that she will get better with monitoring and treating her CBGs

## 2017-03-23 NOTE — Discharge Instructions (Signed)
Diabetic Ketoacidosis °Diabetic ketoacidosis is a life-threatening complication of diabetes. If it is not treated, it can cause severe dehydration and organ damage and can lead to a coma or death. °What are the causes? °This condition develops when there is not enough of the hormone insulin in the body. Insulin helps the body to break down sugar for energy. Without insulin, the body cannot break down sugar, so it breaks down fats instead. This leads to the production of acids that are called ketones. Ketones are poisonous at high levels. °This condition can be triggered by: °· Stress on the body that is brought on by an illness. °· Medicines that raise blood glucose levels. °· Not taking diabetes medicine. ° °What are the signs or symptoms? °Symptoms of this condition include: °· Fatigue. °· Weight loss. °· Excessive thirst. °· Light-headedness. °· Fruity or sweet-smelling breath. °· Excessive urination. °· Vision changes. °· Confusion or irritability. °· Nausea. °· Vomiting. °· Rapid breathing. °· Abdominal pain. °· Feeling flushed. ° °How is this diagnosed? °This condition is diagnosed based on a medical history, a physical exam, and blood tests. You may also have a urine test that checks for ketones. °How is this treated? °This condition may be treated with: °· Fluid replacement. This may be done to correct dehydration. °· Insulin injections. These may be given through the skin or through an IV tube. °· Electrolyte replacement. Electrolytes, such as potassium and sodium, may be given in pill form or through an IV tube. °· Antibiotic medicines. These may be prescribed if your condition was caused by an infection. ° °Follow these instructions at home: °Eating and drinking °· Drink enough fluids to keep your urine clear or pale yellow. °· If you cannot eat, alternate between drinking fluids with sugar (such as juice) and salty fluids (such as broth or bouillon). °· If you can eat, follow your usual diet and drink  sugar-free liquids, such as water. °Other Instructions ° °· Take insulin as directed by your health care provider. Do not skip insulin injections. Do not use expired insulin. °· If your blood sugar is over 240 mg/dL, monitor your urine ketones every 4-6 hours. °· If you were prescribed an antibiotic medicine, finish all of it even if you start to feel better. °· Rest and exercise only as directed by your health care provider. °· If you get sick, call your health care provider and begin treatment quickly. Your body often needs extra insulin to fight an illness. °· Check your blood glucose levels regularly. If your blood glucose is high, drink plenty of fluids. This helps to flush out ketones. °Contact a health care provider if: °· Your blood glucose level is too high or too low. °· You have ketones in your urine. °· You have a fever. °· You cannot eat. °· You cannot tolerate fluids. °· You have been vomiting for more than 2 hours. °· You continue to have symptoms of this condition. °· You develop new symptoms. °Get help right away if: °· Your blood glucose levels continue to be high (elevated). °· Your monitor reads “high” even when you are taking insulin. °· You faint. °· You have chest pain. °· You have trouble breathing. °· You have a sudden, severe headache. °· You have sudden weakness in one arm or one leg. °· You have sudden trouble speaking or swallowing. °· You have vomiting or diarrhea that gets worse after 3 hours. °· You feel severely fatigued. °· You have trouble thinking. °· You   have abdominal pain. °· You are severely dehydrated. Symptoms of severe dehydration include: °? Extreme thirst. °? Dry mouth. °? Blue lips. °? Cold hands and feet. °? Rapid breathing. °This information is not intended to replace advice given to you by your health care provider. Make sure you discuss any questions you have with your health care provider. °Document Released: 11/23/2000 Document Revised: 05/03/2016 Document  Reviewed: 11/03/2014 °Elsevier Interactive Patient Education © 2017 Elsevier Inc. ° °

## 2017-03-23 NOTE — Progress Notes (Signed)
Pt education completed to include future appointments, current prescriptions and medications, and doctors discharge instructions. Pt verbalized that she will check her CBG as prescribed and treat accordingly. Pt alert and oriented, vital signs stable. Pt discharged with nursing staff. Temp: 98.2 F (36.8 C) (04/14 1132) Temp Source: Oral (04/14 1132) BP: 110/62 (04/14 1132) Pulse Rate: 87 (04/14 1132)  Wray Kearns 03/23/2017 3:19 PM

## 2017-03-23 NOTE — Discharge Summary (Signed)
DISCHARGE SUMMARY  [name] [name]  MR#: [id number]  DOB:11-04-1961  Date of Admission: 03/22/2017 Date of Discharge: 03/23/2017  Attending Physician:Kindsey Eblin T  Patient's PCP:Amy Diona Browner, MD  Consults:  none  Disposition: D/C home   Follow-up Appts: Follow-up Information    Eliezer Lofts, MD Follow up in 5 day(s).   Specialty:  Family Medicine Contact information: [place] [address] [phone]           Tests Needing Follow-up: -regular monitoring of her CBG control is suggested  Discharge Diagnoses: Uncontrolled DM2 w/ DKA Hematemesis  Nonischemic Cardiomyopathy - Chronic systolic CHF  Hypothyroidism Hypokalemia  Initial presentation: 56 y.o.femalewith a history of uncontrolled DM w/ frequent DKA, hypothyroidism, NICM EF 35%, and recent H pylori s/p txwho presented with 1 day vomiting and malaise.  She admitted to missing several doses of her insulin. She then had an episode in which she vomited some "dark blood", and then blew her nose and noted bright red blood, so her husband called 9-1-1.  Her ED evaluation was c/w DKA, and she was admitted.    Hospital Course: The patient's DKA was rapidly corrected using IV insulin therapy.  She admitted to missing doses of insulin and this was felt to be the cause of her DKA.  There was no objective evidence to suggest an active infection.  She suffered no other significant acute issues during her hospital stay.  Her reported hematemesis appeared to be self-limited and she was tolerating a diet with no difficulty at the time of her discharge.  She is counseled absolute need to maintain compliance with her insulin therapy.  Allergies as of 03/23/2017      Reactions   Codeine Hives, Anxiety, Other (See Comments)      Medication List    TAKE these medications   aspirin 81 MG chewable tablet Chew 1 tablet (81 mg total) by mouth daily.   atorvastatin 80 MG tablet Commonly known as:   LIPITOR Take 1 tablet (80 mg total) by mouth daily at 6 PM.   BASAGLAR KWIKPEN 100 UNIT/ML Sopn INJECT 50 UNITS INTO THE SKIN DAILY AT 10 PM.   cetirizine 10 MG tablet Commonly known as:  ZYRTEC Take 10 mg by mouth at bedtime.   ezetimibe 10 MG tablet Commonly known as:  ZETIA Take 1 tablet (10 mg total) by mouth daily.   HUMALOG KWIKPEN 100 UNIT/ML KiwkPen Generic drug:  insulin lispro Inject 12-15 Units into the skin 3 (three) times daily.   levothyroxine 25 MCG tablet Commonly known as:  SYNTHROID, LEVOTHROID Take 0.5 tablets (12.5 mcg total) by mouth daily before breakfast. In addition to 200 mcg daily for TDD 212.5 mcg What changed:  when to take this  additional instructions   levothyroxine 200 MCG tablet Commonly known as:  SYNTHROID, LEVOTHROID TAKE 1 TABLET BY MOUTH DAILY TAKE ALONG WITH THE 12.5 MG DOSE. FOR TOTAL OF 212.5MCG What changed:  Another medication with the same name was changed. Make sure you understand how and when to take each.   metFORMIN 500 MG 24 hr tablet Commonly known as:  GLUCOPHAGE-XR Take 2 tablets (1,000 mg total) by mouth 2 (two) times daily after a meal. What changed:  how much to take  when to take this   omeprazole 40 MG capsule Commonly known as:  PRILOSEC Take 1 capsule (40 mg total) by mouth 2 (two) times daily.   ondansetron 4 MG tablet Commonly known as:  ZOFRAN Take 4 mg  by mouth every 6 (six) hours as needed for nausea or vomiting.   RELPAX 40 MG tablet Generic drug:  eletriptan ONE TABLET BY MOUTH AT ONSET OF HEADACHE. MAY REPEAT IN 2 HOURS IF HEADACHE PERSISTS OR RECURS.   sertraline 50 MG tablet Commonly known as:  ZOLOFT Take 1 tablet (50 mg total) by mouth daily. What changed:  when to take this       Day of Discharge BP 110/62 (BP Location: Left Arm)   Pulse 87   Temp 98.2 F (36.8 C) (Oral)   Resp 19   Ht 4\' 11"  (1.499 m)   Wt 58.4 kg (128 lb 12 oz)   LMP 08/11/2012   SpO2 100%   BMI 26.00 kg/m    Physical Exam: General: No acute respiratory distress Lungs: Clear to auscultation bilaterally without wheezes or crackles Cardiovascular: Regular rate and rhythm without murmur gallop or rub normal S1 and S2 Abdomen: Nontender, nondistended, soft, bowel sounds positive, no rebound, no ascites, no appreciable mass Extremities: No significant cyanosis, clubbing, or edema bilateral lower extremities  Basic Metabolic Panel:  Recent Labs Lab 03/22/17 1359 03/22/17 2250 03/23/17 0520  NA 140 142 141  K 3.0* 3.6 3.0*  CL 105 108 108  CO2 16* 26 20*  GLUCOSE 319* 186* 173*  BUN 12 12 13   CREATININE 0.87 0.59 0.54  CALCIUM 10.3 9.0 9.0  MG  --  1.7  --     Liver Function Tests:  Recent Labs Lab 03/22/17 1359  AST 37  ALT 16  ALKPHOS 77  BILITOT 0.6  PROT 8.0  ALBUMIN 4.7   CBC:  Recent Labs Lab 03/22/17 1359  WBC 23.2*  HGB 17.4*  HCT 48.5*  MCV 83.5  PLT 406*    Cardiac Enzymes:  Recent Labs Lab 03/22/17 1654  TROPONINI <0.03   CBG:  Recent Labs Lab 03/23/17 0055 03/23/17 0158 03/23/17 0415 03/23/17 0752 03/23/17 1135  GLUCAP 128* 107* 144* 184* 135*    Recent Results (from the past 240 hour(s))  MRSA PCR Screening     Status: None   Collection Time: 03/22/17  9:10 PM  Result Value Ref Range Status   MRSA by PCR NEGATIVE NEGATIVE Final    Comment:        The GeneXpert MRSA Assay (FDA approved for NASAL specimens only), is one component of a comprehensive MRSA colonization surveillance program. It is not intended to diagnose MRSA infection nor to guide or monitor treatment for MRSA infections.       Time spent in discharge (includes decision making & examination of pt): <30 minutes  03/23/2017, 2:09 PM   Cherene Altes, MD [facility] Office  [phone] Pager [phone]  On-Call/Text Page:      [email or web address]      password [facility]

## 2017-03-25 ENCOUNTER — Telehealth: Payer: Self-pay | Admitting: *Deleted

## 2017-03-25 NOTE — Telephone Encounter (Signed)
Lm requesting return call to complete TCM and confirm hosp f/u appt  

## 2017-03-27 ENCOUNTER — Other Ambulatory Visit: Payer: Self-pay | Admitting: Internal Medicine

## 2017-04-03 ENCOUNTER — Encounter: Payer: Self-pay | Admitting: Internal Medicine

## 2017-04-03 ENCOUNTER — Ambulatory Visit (INDEPENDENT_AMBULATORY_CARE_PROVIDER_SITE_OTHER): Payer: 59 | Admitting: Internal Medicine

## 2017-04-03 ENCOUNTER — Telehealth: Payer: Self-pay | Admitting: Internal Medicine

## 2017-04-03 ENCOUNTER — Other Ambulatory Visit: Payer: Self-pay | Admitting: Internal Medicine

## 2017-04-03 VITALS — BP 118/82 | HR 92 | Wt 126.0 lb

## 2017-04-03 DIAGNOSIS — Z794 Long term (current) use of insulin: Secondary | ICD-10-CM | POA: Diagnosis not present

## 2017-04-03 DIAGNOSIS — E039 Hypothyroidism, unspecified: Secondary | ICD-10-CM

## 2017-04-03 DIAGNOSIS — E1110 Type 2 diabetes mellitus with ketoacidosis without coma: Secondary | ICD-10-CM

## 2017-04-03 LAB — T4, FREE: Free T4: 1.39 ng/dL (ref 0.60–1.60)

## 2017-04-03 LAB — HEMOGLOBIN A1C: Hgb A1c MFr Bld: 10.5 % — ABNORMAL HIGH (ref 4.6–6.5)

## 2017-04-03 LAB — TSH: TSH: 0.04 u[IU]/mL — ABNORMAL LOW (ref 0.35–4.50)

## 2017-04-03 MED ORDER — FREESTYLE LIBRE READER DEVI: 1.0000 | Freq: Three times a day (TID) | 1 refills | Status: DC

## 2017-04-03 MED ORDER — LEVOTHYROXINE SODIUM 150 MCG PO TABS: 150.0000 ug | ORAL_TABLET | Freq: Every day | ORAL | 3 refills | Status: DC

## 2017-04-03 MED ORDER — BASAGLAR KWIKPEN 100 UNIT/ML ~~LOC~~ SOPN: PEN_INJECTOR | SUBCUTANEOUS | 1 refills | Status: DC

## 2017-04-03 MED ORDER — METFORMIN HCL ER 500 MG PO TB24: 1000.0000 mg | ORAL_TABLET | Freq: Two times a day (BID) | ORAL | 3 refills | Status: DC

## 2017-04-03 MED ORDER — FREESTYLE LIBRE SENSOR SYSTEM MISC: 1.0000 | 11 refills | Status: DC

## 2017-04-03 NOTE — Patient Instructions (Addendum)
Please increase Metformin ER to 1000 mg 2x a day. Increase Basaglar to 55 units at night. Continue: - Humalog 3x a day: - 15 units before a smaller meal  - 20 units before a larger meal  Please come back for a follow-up appointment in 3 months

## 2017-04-03 NOTE — Telephone Encounter (Signed)
Pt called and said that her insurance will cover an insulin pump through either [employer], [employer], or [employer].  She said she would like to go ahead and get this started.

## 2017-04-03 NOTE — Telephone Encounter (Signed)
I have submitted the pump through [place] for the freestyle [place] today.

## 2017-04-03 NOTE — Progress Notes (Signed)
Patient ID: [name] [name], female   DOB: 11/19/1961, 56 y.o.   MRN: [id number]  HPI: [name] [name] is a 56 y.o.-year-old female, initially referred by her PCP, Dr.Bedsole, for management of DM2, dx in 2012, insulin-dependent since 2015, uncontrolled, without complications and also hypothyroidism. Last OV 7 mo ago.  SHE IS NOT COMPLIANT WITH THE APPTS!  Since last OV >> had N/V >> had H pylori >> now treated.  She had nutrition class and carb counting training in the past. She is interested in an insulin pump, but is not sure whether her insurance will cover it.  DM2: Last hemoglobin A1c was: Lab Results  Component Value Date   HGBA1C 12.3 (H) 01/22/2017   HGBA1C 14.5 07/17/2016   HGBA1C 14.7 (H) 09/06/2015   Pt was on a regimen of: - VGo 40 with 6 clicks per meal - her granddaughter rips it off. She lately did not have it attached because of this. Insurance not covering it completely.  - Invokana 300 mg daily in am  Now on: - Basaglar 50 units at bedtime.  - Humalog 3x a day to - misses several doses a week: actually taking it 1-2x a day - 15 units before a smaller meal  - 20 units before a larger meal  - Metformin ER 1000 mg in am  She tried (per review of Dr. Boyd Kerbs note): - Tried Metformin and states that "it wasn't working". Probably affected her IBS symptoms as well, but doesn't recall specifically.  - Then tried glipizide and that " didn't work either".  - Has been on insulin since ~2015. - Started on lantus, but having problems with compliance as having to force herself to take the shot. Doesn't like injectable therapy.   - Has been on Novolog before but doesn't like the shots.  - Switched from Lantus to [place] with Humalog Jan 2016  Pt checks her sugars 2-3x a day >> sugars:  - am: n/c >> 335-371 >> 212-370, 417 >> 69, 200-250,395 - 2h after b'fast: n/c >> 368, 540 >> 349-416 >> n/c - before lunch: n/c >> 327-427 >> 210-385 >> 150-250 - 2h after lunch: n/c >> 473 >>  304-448 >> n/c - before dinner: n/c >> 274-497 >> 143-383 >> 200 - 2h after dinner: n/c >> 351-419 - bedtime: n/c >> 268-402, 514 >> 350-406, 514 >> 150s - nighttime: 346  Glucometer: OneTouch Verio Flex meter  Pt's meals are: - Breakfast: toast or Glucerna/boost - Lunch: 1/2 sandwich - Dinner: meat + veggies, baked potato.  - Snacks: chips, cut down to 1 Mtn Dew a day!  - no CKD, last BUN/creatinine:  Lab Results  Component Value Date   BUN 13 03/23/2017   CREATININE 0.54 03/23/2017   - last set of lipids: Lab Results  Component Value Date   CHOL 251 (H) 09/11/2016   HDL 45.80 09/11/2016   LDLCALC 170 (H) 09/11/2016   LDLDIRECT 206.0 07/27/2014   TRIG 177.0 (H) 09/11/2016   CHOLHDL 5 09/11/2016   - last eye exam was: 02/21/2016: No retinopathy - Denies numbness and tingling in his feet  Hypothyroidism: - dx 2008 - on LT4 200 >> 212.5 mcg daily - takes LT4 - reportedly not missing doses anymore: Fasting, at 4 am In am With water Eats >60 min later No Iron, Calcium, MVI Added PPI 2x a day - mid-am - in 09/2016  Her TSH levels are very fluctuating due to poor compliance with her levothyroxine.   Lab  Results  Component Value Date   TSH 31.821 (H) 07/25/2016   TSH 59.96 (H) 09/06/2015   TSH 31.59 (H) 04/26/2015   TSH 0.155 (L) 01/27/2015   TSH 13.32 (H) 01/04/2015   TSH 23.39 (H) 10/13/2013   TSH 11.19 (H) 10/06/2013   TSH 0.015 (L) 09/18/2013   TSH 9.09 (H) 06/23/2013   TSH 8.43 (H) 05/13/2012   FREET4 0.36 (L) 09/06/2015   FREET4 0.26 (L) 04/26/2015   FREET4 2.30 (H) 01/27/2015   FREET4 0.57 (L) 01/04/2015   FREET4 0.56 (L) 07/27/2014   FREET4 0.56 (L) 01/08/2014   FREET4 2.28 (H) 10/29/2013   FREET4 0.65 08/26/2012   FREET4 0.63 05/20/2012   FREET4 0.63 11/30/2011   She was admitted 07/24/2016 for DKA after she developed HA and nausea. CGB >900. She was taken off Metformin and Invokana. Now only on insulins  ROS: Constitutional: no weight gain/+  weight loss, no fatigue, no subjective hyperthermia, no subjective hypothermia, + nocturia Eyes: no blurry vision, no xerophthalmia ENT: no sore throat, no nodules palpated in throat, no dysphagia, no odynophagia, no hoarseness Cardiovascular: + CP (GERD)/no SOB/no palpitations/no leg swelling Respiratory: no cough/no SOB Gastrointestinal: + N/no V/no D/no C Musculoskeletal: no muscle aches/no joint aches Skin: no rashes Neurological: no tremors/no numbness/no tingling/no dizziness  I reviewed pt's medications, allergies, PMH, social hx, family hx, and changes were documented in the history of present illness. Otherwise, unchanged from my initial visit note.  Past Medical History:  Diagnosis Date  . Anxiety   . B12 deficiency   . GERD (gastroesophageal reflux disease)   . Hyperlipidemia   . Hypertension    [name]  . Hypothyroidism   . IBS (irritable bowel syndrome)   . Intermittent vertigo   . 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 Leg cramping    "@ night" (09/18/2013)  . Migraine    "once q couple months" (09/18/2013)  . Osteoporosis   . Peripheral neuropathy   . Type II diabetes mellitus ([place])   . Umbilical hernia    unrepaired (09/18/2013)   Past Surgical History:  Procedure Laterality Date  . CESAREAN SECTION  1989  . LAPAROSCOPIC ASSISTED VAGINAL HYSTERECTOMY  09/23/2012   Procedure: LAPAROSCOPIC ASSISTED VAGINAL HYSTERECTOMY;  Surgeon: Linda Hedges, DO;  Location: [facility];  Service: Gynecology;  Laterality: N/A;  pull Dr Gregor Hams instrument  . LEFT HEART CATH AND CORONARY ANGIOGRAPHY N/A 02/11/2017   Procedure: Left Heart Cath and Coronary Angiography;  Surgeon: Lorretta Harp, MD;  Location: [facility];  Service: Cardiovascular;  Laterality: N/A;  . TUBAL LIGATION Bilateral 1992  . VAGINAL HYSTERECTOMY  2013   Social History   Social History  . Marital status: Married    Spouse name: N/A  . Number of children: 2  . Years of  education: N/A   Occupational History  . UNEMPLOYED Unemployed    homemaker   Social History Main Topics  . Smoking status: Former Smoker    Years: 0.20    Types: Cigarettes    Quit date: 12/10/1990  . Smokeless tobacco: Never Used  . Alcohol use No  . Drug use: No  . Sexual activity: Yes   Other Topics Concern  . Not on file   Social History Narrative   Regular exercise- no    Diet- lots of mountain dew, limits fast foods    Current Outpatient Prescriptions on File Prior to Visit  Medication Sig Dispense Refill  . aspirin  81 MG chewable tablet Chew 1 tablet (81 mg total) by mouth daily. 30 tablet 0  . atorvastatin (LIPITOR) 80 MG tablet Take 1 tablet (80 mg total) by mouth daily at 6 PM. 90 tablet 0  . cetirizine (ZYRTEC) 10 MG tablet Take 10 mg by mouth at bedtime.  11  . ezetimibe (ZETIA) 10 MG tablet Take 1 tablet (10 mg total) by mouth daily. 30 tablet 11  . Insulin Glargine (BASAGLAR KWIKPEN) 100 UNIT/ML SOPN INJECT 50 UNITS INTO THE SKIN DAILY AT 10 PM. 45 pen 1  . insulin lispro (HUMALOG KWIKPEN) 100 UNIT/ML KiwkPen Inject 12-15 Units into the skin 3 (three) times daily.    Marland Kitchen levothyroxine (SYNTHROID, LEVOTHROID) 200 MCG tablet TAKE 1 TABLET BY MOUTH DAILY TAKE ALONG WITH THE 12.5 MG DOSE. FOR TOTAL OF 212.5MCG 60 tablet 1  . levothyroxine (SYNTHROID, LEVOTHROID) 25 MCG tablet TAKE 1/2 TABLET BY MOUTH DAILY BEFORE BREAKFAST IN ADDITION TO 200MCG FOR TOTAL =212.5MCG 30 tablet 2  . metFORMIN (GLUCOPHAGE-XR) 500 MG 24 hr tablet Take 2 tablets (1,000 mg total) by mouth 2 (two) times daily after a meal. (Patient taking differently: Take 500 mg by mouth daily with breakfast. ) 360 tablet 3  . omeprazole (PRILOSEC) 40 MG capsule Take 1 capsule (40 mg total) by mouth 2 (two) times daily. 60 capsule 1  . ondansetron (ZOFRAN) 4 MG tablet Take 4 mg by mouth every 6 (six) hours as needed for nausea or vomiting.    Marland Kitchen RELPAX 40 MG tablet ONE TABLET BY MOUTH AT ONSET OF HEADACHE. MAY  REPEAT IN 2 HOURS IF HEADACHE PERSISTS OR RECURS. 10 tablet 2  . sertraline (ZOLOFT) 50 MG tablet Take 1 tablet (50 mg total) by mouth daily. (Patient taking differently: Take 50 mg by mouth at bedtime. ) 30 tablet 3   No current facility-administered medications on file prior to visit.    Allergies  Allergen Reactions  . Codeine Hives, Anxiety and Other (See Comments)   Family History  Problem Relation Age of Onset  . Diabetes Other   . Heart disease Other   . Heart failure Mother   . Diabetes Maternal Grandmother   . Alzheimer's disease Maternal Grandmother   . Coronary artery disease Maternal Grandmother   . Heart attack Maternal Grandfather   . Heart failure Brother    PE: BP 118/82 (BP Location: Left Arm, Patient Position: Sitting)   Pulse 92   Wt 126 lb (57.2 kg)   LMP 08/11/2012   SpO2 97%   BMI 25.45 kg/m  Body mass index is 25.45 kg/m. Wt Readings from Last 3 Encounters:  04/03/17 126 lb (57.2 kg)  03/22/17 128 lb 12 oz (58.4 kg)  02/15/17 132 lb 8 oz (60.1 kg)   Constitutional:Normal weight, in NAD, + fidgety Eyes: PERRLA, EOMI, no exophthalmos ENT: moist mucous membranes, no thyromegaly, no cervical lymphadenopathy Cardiovascular: RRR, No MRG Respiratory: CTA B Gastrointestinal: abdomen soft, NT, ND, BS+ Musculoskeletal: no deformities, strength intact in all 4 Skin: moist, warm, no rashes Neurological: no tremor with outstretched hands, DTR normal in all 4  ASSESSMENT: 1. DM2, insulin-dependent, uncontrolled, with DKA - Noncompliant with CBG checks and meds  2. Hypothyroidism -  uncontrolled   PLAN:  1. Patient with long-standing, uncontrolled diabetes, with history of medication noncompliance and also noncompliance with office visit. At this visit, she returns after 7 months, without a CBG log or glucometer. Her sugars appear to be better, especially after she lost weight since last  visit. Unfortunately, the weight loss was associated with H. pylori  infection, which is now resolved. - She is back on the metformin after resolution of her H. pylori infection. She is taking it in the morning and I advised her to try to increase to twice a day as sugars in the morning are still high. If this is now working or not enough, I advised her to also increase Engineer, agricultural. - She needs to be tested for DM1, but I cannot check a C peptide yet as her sugars are too high. We may be able to check at next visit. She will also need this for insurance purposes if he decides to go with the insulin pump. I also advised her to call her insurance to see if they cover a pump in which prompted cover. - She already had carb counting classes  - I suggested to:  Patient Instructions  Please increase Metformin ER to 1000 mg 2x a day. Increase Basaglar to 55 units at night. Continue: - Humalog 3x a day: - 15 units before a smaller meal  - 20 units before a larger meal  Please come back for a follow-up appointment in 3 months  - start checking sugars at different times of the day, 3 times a day. At this visit, she tells me that she is interested in the [employer] CGM. She would like to get this for now, and she can decide about the insulin pump later. Prescriptions were sent to her pharmacy. - Advised her for yearly eye exam. She is up to date. - She is due for cholesterol check. She has an appointment with PCP coming up. - Return to clinic in 3 mo with sugar log    2. Hypothyroidism - This is uncontrolled due to noncompliance with her levothyroxine. We'll continue with her current dose of levothyroxine, 212.5 g daily. She is now reportedly taking it every day. Since last visit, she added a PPI, but she takes this more than 4 hours after taking levothyroxine. - She has no complaints at this visit, and she is happy that she lost weight. - again advised her to: Take the thyroid hormone every day, with water, at least 30 minutes before breakfast, separated by at least 4  hours from: - acid reflux medications - calcium - iron - multivitamins She appears to be taking it correctly. Burnis Medin recheck her thyroid tests today  Office Visit on 04/03/2017  Component Date Value Ref Range Status  . Free T4 04/03/2017 1.39  0.60 - 1.60 ng/dL Final   Comment: Specimens from patients who are undergoing biotin therapy and /or ingesting biotin supplements may contain high levels of biotin.  The higher biotin concentration in these specimens interferes with this Free T4 assay.  Specimens that contain high levels  of biotin may cause false high results for this Free T4 assay.  Please interpret results in light of the total clinical presentation of the patient.    Marland Kitchen TSH 04/03/2017 0.04* 0.35 - 4.50 uIU/mL Final  . Hgb A1c MFr Bld 04/03/2017 10.5* 4.6 - 6.5 % Final   HbA1c improving. TSH now suppressed 2/2 increased compliance >> will decrease LT4 to 150 mcg daily and recheck her TSH at next visit.    04/03/17 2:29 PM  Pt called and said that her insurance will cover an insulin pump through either [employer], [employer], or [employer].  She said she would like to go ahead and get this started.  Philemon Kingdom, MD PhD [facility]

## 2017-04-05 ENCOUNTER — Encounter (HOSPITAL_COMMUNITY): Payer: Self-pay | Admitting: Emergency Medicine

## 2017-04-05 ENCOUNTER — Emergency Department (HOSPITAL_COMMUNITY): Payer: 59

## 2017-04-05 ENCOUNTER — Observation Stay (HOSPITAL_COMMUNITY)
Admission: EM | Admit: 2017-04-05 | Discharge: 2017-04-08 | Disposition: A | Discharge status: Home / Self Care | Payer: 59 | Attending: Internal Medicine | Admitting: Internal Medicine

## 2017-04-05 DIAGNOSIS — E538 Deficiency of other specified B group vitamins: Secondary | ICD-10-CM | POA: Diagnosis not present

## 2017-04-05 DIAGNOSIS — R0789 Other chest pain: Secondary | ICD-10-CM

## 2017-04-05 DIAGNOSIS — Z8619 Personal history of other infectious and parasitic diseases: Secondary | ICD-10-CM | POA: Insufficient documentation

## 2017-04-05 DIAGNOSIS — E876 Hypokalemia: Secondary | ICD-10-CM | POA: Insufficient documentation

## 2017-04-05 DIAGNOSIS — R079 Chest pain, unspecified: Principal | ICD-10-CM | POA: Diagnosis present

## 2017-04-05 DIAGNOSIS — Z885 Allergy status to narcotic agent status: Secondary | ICD-10-CM | POA: Diagnosis not present

## 2017-04-05 DIAGNOSIS — E785 Hyperlipidemia, unspecified: Secondary | ICD-10-CM | POA: Diagnosis not present

## 2017-04-05 DIAGNOSIS — Z79899 Other long term (current) drug therapy: Secondary | ICD-10-CM | POA: Diagnosis not present

## 2017-04-05 DIAGNOSIS — F411 Generalized anxiety disorder: Secondary | ICD-10-CM | POA: Diagnosis not present

## 2017-04-05 DIAGNOSIS — Z794 Long term (current) use of insulin: Secondary | ICD-10-CM | POA: Diagnosis not present

## 2017-04-05 DIAGNOSIS — I428 Other cardiomyopathies: Secondary | ICD-10-CM

## 2017-04-05 DIAGNOSIS — R110 Nausea: Secondary | ICD-10-CM | POA: Diagnosis present

## 2017-04-05 DIAGNOSIS — E1142 Type 2 diabetes mellitus with diabetic polyneuropathy: Secondary | ICD-10-CM | POA: Insufficient documentation

## 2017-04-05 DIAGNOSIS — N182 Chronic kidney disease, stage 2 (mild): Secondary | ICD-10-CM | POA: Diagnosis present

## 2017-04-05 DIAGNOSIS — Z7982 Long term (current) use of aspirin: Secondary | ICD-10-CM | POA: Insufficient documentation

## 2017-04-05 DIAGNOSIS — Z87891 Personal history of nicotine dependence: Secondary | ICD-10-CM | POA: Insufficient documentation

## 2017-04-05 DIAGNOSIS — K589 Irritable bowel syndrome without diarrhea: Secondary | ICD-10-CM | POA: Insufficient documentation

## 2017-04-05 DIAGNOSIS — E1165 Type 2 diabetes mellitus with hyperglycemia: Secondary | ICD-10-CM | POA: Insufficient documentation

## 2017-04-05 DIAGNOSIS — K219 Gastro-esophageal reflux disease without esophagitis: Secondary | ICD-10-CM | POA: Diagnosis present

## 2017-04-05 DIAGNOSIS — Z9119 Patient's noncompliance with other medical treatment and regimen: Secondary | ICD-10-CM | POA: Insufficient documentation

## 2017-04-05 DIAGNOSIS — R1310 Dysphagia, unspecified: Secondary | ICD-10-CM

## 2017-04-05 DIAGNOSIS — F419 Anxiety disorder, unspecified: Secondary | ICD-10-CM | POA: Diagnosis not present

## 2017-04-05 DIAGNOSIS — I429 Cardiomyopathy, unspecified: Secondary | ICD-10-CM | POA: Insufficient documentation

## 2017-04-05 DIAGNOSIS — I129 Hypertensive chronic kidney disease with stage 1 through stage 4 chronic kidney disease, or unspecified chronic kidney disease: Secondary | ICD-10-CM | POA: Insufficient documentation

## 2017-04-05 DIAGNOSIS — E1110 Type 2 diabetes mellitus with ketoacidosis without coma: Secondary | ICD-10-CM | POA: Diagnosis present

## 2017-04-05 DIAGNOSIS — R739 Hyperglycemia, unspecified: Secondary | ICD-10-CM | POA: Diagnosis present

## 2017-04-05 DIAGNOSIS — I493 Ventricular premature depolarization: Secondary | ICD-10-CM | POA: Diagnosis not present

## 2017-04-05 DIAGNOSIS — E039 Hypothyroidism, unspecified: Secondary | ICD-10-CM | POA: Diagnosis present

## 2017-04-05 DIAGNOSIS — R112 Nausea with vomiting, unspecified: Secondary | ICD-10-CM

## 2017-04-05 DIAGNOSIS — E1122 Type 2 diabetes mellitus with diabetic chronic kidney disease: Secondary | ICD-10-CM | POA: Insufficient documentation

## 2017-04-05 DIAGNOSIS — I10 Essential (primary) hypertension: Secondary | ICD-10-CM | POA: Diagnosis present

## 2017-04-05 HISTORY — DX: Chest pain, unspecified: R07.9

## 2017-04-05 LAB — COMPREHENSIVE METABOLIC PANEL
ALBUMIN: 4.3 g/dL (ref 3.5–5.0)
ALK PHOS: 64 U/L (ref 38–126)
ALT: 12 U/L — ABNORMAL LOW (ref 14–54)
ANION GAP: 16 — AB (ref 5–15)
AST: 21 U/L (ref 15–41)
BILIRUBIN TOTAL: 0.6 mg/dL (ref 0.3–1.2)
BUN: 13 mg/dL (ref 6–20)
CHLORIDE: 96 mmol/L — AB (ref 101–111)
CO2: 24 mmol/L (ref 22–32)
Calcium: 10 mg/dL (ref 8.9–10.3)
Creatinine, Ser: 0.76 mg/dL (ref 0.44–1.00)
GFR calc Af Amer: >60 mL/min (ref >60)
GFR calc non Af Amer: >60 mL/min (ref >60)
GLUCOSE: 352 mg/dL — AB (ref 65–99)
POTASSIUM: 4.1 mmol/L (ref 3.5–5.1)
Sodium: 136 mmol/L (ref 135–145)
TOTAL PROTEIN: 7.3 g/dL (ref 6.5–8.1)

## 2017-04-05 LAB — CBC
HEMATOCRIT: 45.1 % (ref 36.0–46.0)
HEMOGLOBIN: 15.8 g/dL — AB (ref 12.0–15.0)
MCH: 29.5 pg (ref 26.0–34.0)
MCHC: 35 g/dL (ref 30.0–36.0)
MCV: 84.1 fL (ref 78.0–100.0)
PLATELETS: 387 10*3/uL (ref 150–400)
RBC: 5.36 MIL/uL — AB (ref 3.87–5.11)
RDW: 14.2 % (ref 11.5–15.5)
WBC: 14.2 10*3/uL — AB (ref 4.0–10.5)

## 2017-04-05 LAB — LIPID PANEL
CHOLESTEROL: 253 mg/dL — AB (ref 0–200)
HDL: 51 mg/dL (ref >40)
LDL Cholesterol: 179 mg/dL — ABNORMAL HIGH (ref 0–99)
TRIGLYCERIDES: 115 mg/dL (ref <150)
Total CHOL/HDL Ratio: 5 RATIO
VLDL: 23 mg/dL (ref 0–40)

## 2017-04-05 LAB — I-STAT BETA HCG BLOOD, ED (MC, WL, AP ONLY): HCG, QUANTITATIVE: 6 m[IU]/mL — AB (ref <5)

## 2017-04-05 LAB — URINALYSIS, ROUTINE W REFLEX MICROSCOPIC
BILIRUBIN URINE: NEGATIVE
Glucose, UA: >=500 mg/dL — AB
Hgb urine dipstick: NEGATIVE
KETONES UR: 80 mg/dL — AB
LEUKOCYTES UA: NEGATIVE
Nitrite: NEGATIVE
Protein, ur: 100 mg/dL — AB
Specific Gravity, Urine: 1.035 — ABNORMAL HIGH (ref 1.005–1.030)
pH: 5 (ref 5.0–8.0)

## 2017-04-05 LAB — GLUCOSE, CAPILLARY
GLUCOSE-CAPILLARY: 209 mg/dL — AB (ref 65–99)
Glucose-Capillary: 196 mg/dL — ABNORMAL HIGH (ref 65–99)

## 2017-04-05 LAB — TROPONIN I
Troponin I: <0.03 ng/mL (ref <0.03)
Troponin I: <0.03 ng/mL (ref <0.03)

## 2017-04-05 LAB — I-STAT TROPONIN, ED: Troponin i, poc: 0 ng/mL (ref 0.00–0.08)

## 2017-04-05 LAB — MAGNESIUM: Magnesium: 1.9 mg/dL (ref 1.7–2.4)

## 2017-04-05 LAB — PHOSPHORUS: PHOSPHORUS: 3 mg/dL (ref 2.5–4.6)

## 2017-04-05 LAB — CBG MONITORING, ED: Glucose-Capillary: 271 mg/dL — ABNORMAL HIGH (ref 65–99)

## 2017-04-05 LAB — PROTIME-INR
INR: 1.02
Prothrombin Time: 13.4 seconds (ref 11.4–15.2)

## 2017-04-05 MED ORDER — INSULIN ASPART 100 UNIT/ML ~~LOC~~ SOLN
SUBCUTANEOUS | Status: AC
  Filled 2017-04-05: qty 1

## 2017-04-05 MED ORDER — MAGNESIUM SULFATE 2 GM/50ML IV SOLN
2.0000 g | Freq: Once | INTRAVENOUS | Status: AC
  Administered 2017-04-05: 2 g via INTRAVENOUS
  Filled 2017-04-05: qty 50

## 2017-04-05 MED ORDER — FENTANYL CITRATE (PF) 100 MCG/2ML IJ SOLN
25.0000 ug | Freq: Once | INTRAMUSCULAR | Status: AC
  Administered 2017-04-05: 25 ug via INTRAVENOUS
  Filled 2017-04-05: qty 2

## 2017-04-05 MED ORDER — METOPROLOL SUCCINATE ER 25 MG PO TB24
25.0000 mg | ORAL_TABLET | Freq: Every day | ORAL | Status: DC
  Administered 2017-04-05 – 2017-04-08 (×4): 25 mg via ORAL
  Filled 2017-04-05 (×4): qty 1

## 2017-04-05 MED ORDER — GI COCKTAIL ~~LOC~~
30.0000 mL | Freq: Once | ORAL | Status: AC
  Administered 2017-04-05: 30 mL via ORAL
  Filled 2017-04-05: qty 30

## 2017-04-05 MED ORDER — SODIUM CHLORIDE 0.9 % IV BOLUS (SEPSIS)
1000.0000 mL | Freq: Once | INTRAVENOUS | Status: AC
  Administered 2017-04-05: 1000 mL via INTRAVENOUS

## 2017-04-05 MED ORDER — ASPIRIN EC 325 MG PO TBEC
325.0000 mg | DELAYED_RELEASE_TABLET | Freq: Every day | ORAL | Status: DC
  Administered 2017-04-06 – 2017-04-08 (×3): 325 mg via ORAL
  Filled 2017-04-05 (×3): qty 1

## 2017-04-05 MED ORDER — LORAZEPAM 2 MG/ML IJ SOLN
0.5000 mg | Freq: Two times a day (BID) | INTRAMUSCULAR | Status: DC | PRN
  Administered 2017-04-07: 0.5 mg via INTRAVENOUS
  Filled 2017-04-05: qty 1

## 2017-04-05 MED ORDER — NITROGLYCERIN 0.4 MG SL SUBL: 0.4000 mg | SUBLINGUAL_TABLET | SUBLINGUAL | Status: DC | PRN

## 2017-04-05 MED ORDER — SERTRALINE HCL 50 MG PO TABS
50.0000 mg | ORAL_TABLET | Freq: Every day | ORAL | Status: DC
  Administered 2017-04-05 – 2017-04-07 (×3): 50 mg via ORAL
  Filled 2017-04-05 (×4): qty 1

## 2017-04-05 MED ORDER — EZETIMIBE 10 MG PO TABS
10.0000 mg | ORAL_TABLET | Freq: Every day | ORAL | Status: DC
  Administered 2017-04-05 – 2017-04-08 (×4): 10 mg via ORAL
  Filled 2017-04-05 (×4): qty 1

## 2017-04-05 MED ORDER — ONDANSETRON HCL 4 MG/2ML IJ SOLN
INTRAMUSCULAR | Status: AC
  Filled 2017-04-05: qty 2

## 2017-04-05 MED ORDER — LORAZEPAM 2 MG/ML IJ SOLN
0.5000 mg | Freq: Once | INTRAMUSCULAR | Status: AC
  Administered 2017-04-05: 0.5 mg via INTRAVENOUS
  Filled 2017-04-05: qty 1

## 2017-04-05 MED ORDER — INSULIN ASPART 100 UNIT/ML ~~LOC~~ SOLN
0.0000 [IU] | Freq: Three times a day (TID) | SUBCUTANEOUS | Status: DC
  Administered 2017-04-08: 1 [IU] via SUBCUTANEOUS

## 2017-04-05 MED ORDER — MORPHINE SULFATE (PF) 4 MG/ML IV SOLN
2.0000 mg | INTRAVENOUS | Status: DC | PRN
  Administered 2017-04-06 – 2017-04-07 (×2): 2 mg via INTRAVENOUS
  Filled 2017-04-05 (×2): qty 1

## 2017-04-05 MED ORDER — INSULIN ASPART 100 UNIT/ML ~~LOC~~ SOLN
5.0000 [IU] | Freq: Once | SUBCUTANEOUS | Status: AC
  Administered 2017-04-05: 5 [IU] via SUBCUTANEOUS

## 2017-04-05 MED ORDER — ONDANSETRON HCL 4 MG/2ML IJ SOLN: 4.0000 mg | Freq: Four times a day (QID) | INTRAMUSCULAR | Status: DC | PRN

## 2017-04-05 MED ORDER — GI COCKTAIL ~~LOC~~
30.0000 mL | Freq: Four times a day (QID) | ORAL | Status: DC | PRN
  Administered 2017-04-06: 30 mL via ORAL
  Filled 2017-04-05: qty 30

## 2017-04-05 MED ORDER — ONDANSETRON HCL 4 MG/2ML IJ SOLN
4.0000 mg | INTRAMUSCULAR | Status: DC
  Administered 2017-04-05 (×2): 4 mg via INTRAVENOUS
  Filled 2017-04-05 (×3): qty 2

## 2017-04-05 MED ORDER — ATORVASTATIN CALCIUM 80 MG PO TABS
80.0000 mg | ORAL_TABLET | Freq: Every day | ORAL | Status: DC
  Administered 2017-04-05 – 2017-04-08 (×4): 80 mg via ORAL
  Filled 2017-04-05 (×4): qty 1

## 2017-04-05 MED ORDER — ONDANSETRON HCL 4 MG/2ML IJ SOLN
4.0000 mg | Freq: Once | INTRAMUSCULAR | Status: AC
  Administered 2017-04-05: 4 mg via INTRAVENOUS

## 2017-04-05 MED ORDER — ENOXAPARIN SODIUM 40 MG/0.4ML ~~LOC~~ SOLN
40.0000 mg | SUBCUTANEOUS | Status: DC
  Administered 2017-04-05 – 2017-04-08 (×4): 40 mg via SUBCUTANEOUS
  Filled 2017-04-05 (×4): qty 0.4

## 2017-04-05 MED ORDER — INSULIN GLARGINE 100 UNIT/ML ~~LOC~~ SOLN
40.0000 [IU] | Freq: Every day | SUBCUTANEOUS | Status: DC
  Administered 2017-04-05 – 2017-04-06 (×2): 40 [IU] via SUBCUTANEOUS
  Filled 2017-04-05 (×4): qty 0.4

## 2017-04-05 MED ORDER — LEVOTHYROXINE SODIUM 150 MCG PO TABS
150.0000 ug | ORAL_TABLET | Freq: Every day | ORAL | Status: DC
  Administered 2017-04-06 – 2017-04-08 (×3): 150 ug via ORAL
  Filled 2017-04-05: qty 1
  Filled 2017-04-05: qty 2
  Filled 2017-04-05: qty 1
  Filled 2017-04-05: qty 3
  Filled 2017-04-05: qty 2
  Filled 2017-04-05: qty 1

## 2017-04-05 MED ORDER — INSULIN ASPART 100 UNIT/ML ~~LOC~~ SOLN: 0.0000 [IU] | Freq: Every day | SUBCUTANEOUS | Status: DC

## 2017-04-05 MED ORDER — ACETAMINOPHEN 325 MG PO TABS: 650.0000 mg | ORAL_TABLET | ORAL | Status: DC | PRN

## 2017-04-05 MED ORDER — SODIUM CHLORIDE 0.9 % IV SOLN
INTRAVENOUS | Status: AC
  Administered 2017-04-05: 16:00:00 via INTRAVENOUS

## 2017-04-05 NOTE — ED Notes (Signed)
Attempted to call report x 1  

## 2017-04-05 NOTE — ED Provider Notes (Signed)
[facility] DEPT Provider Note   CSN: [id number] Arrival date & time: 04/05/17  0850     History   Chief Complaint Chief Complaint  Patient presents with  . Chest Pain    HPI [name] [name] is a 56 y.o. female.  HPI  Patient presents with concern of headache, nausea, vomiting, and most significantly chest pain. Patient acknowledges history of multiple medical issues, notes that for at least the past few months she has had intermittent chest pain This includes recent evaluation, emergency department and cardiologist. Today she developed a recurrence of similar left-sided superior chest pain, described as sore, tight. There was no clear precipitant. However, the patient does note that yesterday she had nausea, vomiting, without abdominal pain, without diarrhea. This has resolved. After last night illness, the patient awoke today with a headache, which was transient, sore, diffuse. As the chest pain developed earlier, the headache came back, and currently she has both headache and chest pain. She denies confusion, disorientation, dyspnea. She is here with a companion who corroborates the history.    Past Medical History:  Diagnosis Date  . Anxiety   . B12 deficiency   . GERD (gastroesophageal reflux disease)   . Hyperlipidemia   . Hypertension    [name]  . Hypothyroidism   . IBS (irritable bowel syndrome)   . Intermittent vertigo   . 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 Leg cramping    "@ night" (09/18/2013)  . Migraine    "once q couple months" (09/18/2013)  . Osteoporosis   . Peripheral neuropathy   . Type II diabetes mellitus ([place])   . Umbilical hernia    unrepaired (09/18/2013)    Patient Active Problem List   Diagnosis Date Noted  . Nonischemic cardiomyopathy ([place]) 03/22/2017  . Hypokalemia 02/15/2017  . NSTEMI (non-ST elevated myocardial infarction) ([place]) 02/11/2017  . Cardiac enzymes elevated   . Chest pain 02/09/2017    . GERD (gastroesophageal reflux disease) 02/09/2017  . H. pylori infection 02/09/2017  . Early satiety 01/22/2017  . DKA, type 2 ([place]) 07/24/2016  . Type 2 diabetes mellitus with ketoacidosis without coma, with long-term current use of insulin ([place]) 07/17/2016  . Atrial fibrillation ([place]) 02/01/2015  . 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 Hepatic lesion 09/18/2013  . Vitamin D deficiency 05/20/2012  . Essential hypertension 02/01/2011  . Osteoporosis 02/01/2011  . B12 deficiency 04/04/2010  . Peripheral neuropathy ([place]) 05/10/2009  . GERD 04/08/2009  . Generalized anxiety disorder 06/22/2008  . Hypothyroidism, acquired 04/29/2008  . Hyperlipidemia 04/29/2008  . Irritable bowel syndrome 04/29/2008    Past Surgical History:  Procedure Laterality Date  . CESAREAN SECTION  1989  . LAPAROSCOPIC ASSISTED VAGINAL HYSTERECTOMY  09/23/2012   Procedure: LAPAROSCOPIC ASSISTED VAGINAL HYSTERECTOMY;  Surgeon: Linda Hedges, DO;  Location: [facility];  Service: Gynecology;  Laterality: N/A;  pull Dr Gregor Hams instrument  . LEFT HEART CATH AND CORONARY ANGIOGRAPHY N/A 02/11/2017   Procedure: Left Heart Cath and Coronary Angiography;  Surgeon: Lorretta Harp, MD;  Location: [facility];  Service: Cardiovascular;  Laterality: N/A;  . TUBAL LIGATION Bilateral 1992  . VAGINAL HYSTERECTOMY  2013    OB History    Gravida Para Term Preterm AB Living   2 2           SAB TAB Ectopic Multiple Live Births  Home Medications    Prior to Admission medications   Medication Sig Start Date End Date Taking? Authorizing Provider  aspirin 81 MG chewable tablet Chew 1 tablet (81 mg total) by mouth daily. 02/12/17   Jani Gravel, MD  atorvastatin (LIPITOR) 80 MG tablet Take 1 tablet (80 mg total) by mouth daily at 6 PM. 09/04/16   Amy E Diona Browner, MD  cetirizine (ZYRTEC) 10 MG tablet Take 10 mg by mouth at bedtime. 03/04/17   Historical Provider, MD  Continuous Blood  Gluc Receiver (FREESTYLE LIBRE READER) DEVI 1 Device by Does not apply route 3 (three) times daily. 04/03/17   Philemon Kingdom, MD  Continuous Blood Gluc Sensor (FREESTYLE LIBRE SENSOR SYSTEM) MISC 1 Device by Does not apply route every 30 (thirty) days. 04/03/17   Philemon Kingdom, MD  ezetimibe (ZETIA) 10 MG tablet Take 1 tablet (10 mg total) by mouth daily. 09/11/16   Amy Cletis Athens, MD  Insulin Glargine (BASAGLAR KWIKPEN) 100 UNIT/ML SOPN INJECT 55 UNITS INTO THE SKIN DAILY AT 10 PM. 04/03/17   Philemon Kingdom, MD  insulin lispro (HUMALOG KWIKPEN) 100 UNIT/ML KiwkPen Inject 15-20 Units into the skin 3 (three) times daily.    Historical Provider, MD  levothyroxine (SYNTHROID, LEVOTHROID) 150 MCG tablet Take 1 tablet (150 mcg total) by mouth daily. 04/03/17   Philemon Kingdom, MD  metFORMIN (GLUCOPHAGE-XR) 500 MG 24 hr tablet Take 2 tablets (1,000 mg total) by mouth 2 (two) times daily after a meal. 04/03/17   Philemon Kingdom, MD  metoprolol succinate (TOPROL-XL) 25 MG 24 hr tablet Take 25 mg by mouth daily. 03/24/17   Historical Provider, MD  omeprazole (PRILOSEC) 40 MG capsule Take 1 capsule (40 mg total) by mouth 2 (two) times daily. 03/19/17   Amy Cletis Athens, MD  ondansetron (ZOFRAN) 4 MG tablet Take 4 mg by mouth every 6 (six) hours as needed for nausea or vomiting.    Historical Provider, MD  RELPAX 40 MG tablet ONE TABLET BY MOUTH AT ONSET OF HEADACHE. MAY REPEAT IN 2 HOURS IF HEADACHE PERSISTS OR RECURS. 10/17/16   Amy Cletis Athens, MD  sertraline (ZOLOFT) 50 MG tablet Take 1 tablet (50 mg total) by mouth daily. Patient taking differently: Take 50 mg by mouth at bedtime.  02/05/17   Amy Cletis Athens, MD    Family History Family History  Problem Relation Age of Onset  . Diabetes Other   . Heart disease Other   . Heart failure Mother   . Diabetes Maternal Grandmother   . Alzheimer's disease Maternal Grandmother   . Coronary artery disease Maternal Grandmother   . Heart attack Maternal Grandfather     . Heart failure Brother     Social History Social History  Substance Use Topics  . Smoking status: Former Smoker    Years: 0.20    Types: Cigarettes    Quit date: 12/10/1990  . Smokeless tobacco: Never Used  . Alcohol use No     Allergies   Codeine   Review of Systems Review of Systems  Constitutional:       Per HPI, otherwise negative  HENT:       Per HPI, otherwise negative  Respiratory:       Per HPI, otherwise negative  Cardiovascular:       Per HPI, otherwise negative  Gastrointestinal: Negative for vomiting.  Endocrine:       Negative aside from HPI  Genitourinary:       Neg aside from HPI  Musculoskeletal:       Per HPI, otherwise negative  Skin: Negative.   Neurological: Negative for syncope.     Physical Exam Updated Vital Signs BP 120/81   Pulse 98   Temp 97.9 F (36.6 C) (Oral)   Resp 12   Ht 5' (1.524 m)   Wt 126 lb (57.2 kg)   LMP 08/11/2012   SpO2 99%   BMI 24.61 kg/m   Physical Exam  Constitutional: She is oriented to person, place, and time. She has a sickly appearance. No distress.  HENT:  Head: Normocephalic and atraumatic.  Eyes: Conjunctivae and EOM are normal.  Cardiovascular: Normal rate and regular rhythm.   Pulmonary/Chest: Effort normal and breath sounds normal. No stridor. No respiratory distress.  Abdominal: She exhibits no distension. There is no tenderness.  Musculoskeletal: She exhibits no edema.  Neurological: She is alert and oriented to person, place, and time. No cranial nerve deficit.  Skin: Skin is warm and dry.  Psychiatric: She has a normal mood and affect.  Nursing note and vitals reviewed.    ED Treatments / Results  Labs (all labs ordered are listed, but only abnormal results are displayed) Labs Reviewed  COMPREHENSIVE METABOLIC PANEL - Abnormal; Notable for the following:       Result Value   Chloride 96 (*)    Glucose, Bld 352 (*)    ALT 12 (*)    Anion gap 16 (*)    All other components  within normal limits  CBC - Abnormal; Notable for the following:    WBC 14.2 (*)    RBC 5.36 (*)    Hemoglobin 15.8 (*)    All other components within normal limits  I-STAT BETA HCG BLOOD, ED (MC, [facility], AP ONLY) - Abnormal; Notable for the following:    I-stat hCG, quantitative 6.0 (*)    All other components within normal limits  MAGNESIUM  TROPONIN I  PROTIME-INR  I-STAT TROPOININ, ED  CBG MONITORING, ED    EKG  EKG Interpretation  Date/Time:  Friday April 05 2017 08:53:59 EDT Ventricular Rate:  95 PR Interval:    QRS Duration: 75 QT Interval:  371 QTC Calculation: 467 R Axis:   95 Text Interpretation:  Sinus rhythm Borderline right axis deviation No significant change since last tracing Borderline ECG Confirmed by Carmin Muskrat  MD [phone]) on 04/05/2017 9:21:20 AM       Radiology Dg Chest 2 View  Result Date: 04/05/2017 CLINICAL DATA:  Left-sided chest pain extends to the back. EXAM: CHEST  2 VIEW COMPARISON:  None. FINDINGS: The heart size and mediastinal contours are within normal limits. Both lungs are clear. The visualized skeletal structures are unremarkable. IMPRESSION: Negative two view chest x-ray Electronically Signed   By: San Morelle M.D.   On: 04/05/2017 09:37    Procedures Procedures (including critical care time)  Medications Ordered in ED Medications  ondansetron (ZOFRAN) 4 MG/2ML injection (not administered)  sodium chloride 0.9 % bolus 1,000 mL (not administered)  LORazepam (ATIVAN) injection 0.5 mg (not administered)  fentaNYL (SUBLIMAZE) injection 25 mcg (25 mcg Intravenous Given 04/05/17 1024)  ondansetron (ZOFRAN) injection 4 mg (4 mg Intravenous Given 04/05/17 1059)   Update: Patient remains uncomfortable appearing, persistent nausea, vomiting. Labs notable for hyperglycemia, leukocytosis, though this is diminished from recent elevated numbers. With hyperglycemia, ongoing vomiting, patient will receive fluids, additional  antiemetics.  This elderly appearing female with multiple medical issues presents with chest pain. Patient also has nausea, vomiting,  headache, but no evidence for meningitis, encephalopathy. Patient does have some evidence for dehydration, and given her persistent nausea, vomiting, hyperglycemia, patient received fluids, as well as multiple antiemetics. Given the patient's risk profile, and ongoing intermittent chest pain, she was admitted for further evaluation and management.  Initial Impression / Assessment and Plan / ED Course  I have reviewed the triage vital signs and the nursing notes.  Pertinent labs & imaging results that were available during my care of the patient were reviewed by me and considered in my medical decision making (see chart for details).    Final Clinical Impressions(s) / ED Diagnoses  Chest pain Nausea and vomiting Hyperglycemia   Carmin Muskrat, MD 04/05/17 1128

## 2017-04-05 NOTE — ED Notes (Signed)
Patient transported to CT 

## 2017-04-05 NOTE — H&P (Signed)
History and Physical    SHY GUALLPA WER:[id number] DOB: [birth year] DOA: 04/05/2017  PCP: Eliezer Lofts, MD Patient coming from: home  Chief Complaint: intermittent chest pain and persistent nausea without vomiting  HPI: [name] [name] is a 56 y.o. female with medical history significant for some dependent diabetes with frequent DKA, nonischemic cardiomyopathy with EF 35%, recent H pylori, hypothyroidism. GERD, dylipidemia and anxiety disorder presents to emergency Department chief complaint persistent nausea vomiting with intermittent chest pain. Initial evaluation reveals hyperglycemia, tachycardia and mildly elevated troponin.  Information is obtained from the patient and her husband who is at the bedside. She reports intermittent chest pain for "quite a while". He states she was in the hospital 2 weeks ago and experienced no chest pain during that hospitalization. She describes the pain as a tightness located left anterior radiating to her back. She denies headache dizziness syncope or near-syncope. No LE edema or orthopnea.  associated symptoms do include shortness of breath cough diaphoresis. In addition she complains of persistent nausea. She denies fever chills dysuria hematuria frequency or urgency. She denies abdominal pain diarrhea constipation melena bright red blood per rectum.  ED Course: In the emergency department she is afebrile hemodynamically stable mildly tachycardic. She is not hypoxic. She is provided with fentanyl and 1L NS.  Review of Systems: As per HPI otherwise 10 point review of systems negative.   Ambulatory Status: She ambulates independently with a steady gait. No recent falls  Past Medical History:  Diagnosis Date  . Anxiety   . B12 deficiency   . GERD (gastroesophageal reflux disease)   . Hyperlipidemia   . Hypertension    [name]  . Hypothyroidism   . IBS (irritable bowel syndrome)   . Intermittent vertigo   . Kidney disease, chronic, stage II (GFR 60-89  ml/min) 10/29/2013  . Leg cramping    "@ night" (09/18/2013)  . Migraine    "once q couple months" (09/18/2013)  . Osteoporosis   . Peripheral neuropathy   . Type II diabetes mellitus ([place])   . Umbilical hernia    unrepaired (09/18/2013)    Past Surgical History:  Procedure Laterality Date  . CESAREAN SECTION  1989  . LAPAROSCOPIC ASSISTED VAGINAL HYSTERECTOMY  09/23/2012   Procedure: LAPAROSCOPIC ASSISTED VAGINAL HYSTERECTOMY;  Surgeon: Linda Hedges, DO;  Location: [facility];  Service: Gynecology;  Laterality: N/A;  pull Dr Gregor Hams instrument  . LEFT HEART CATH AND CORONARY ANGIOGRAPHY N/A 02/11/2017   Procedure: Left Heart Cath and Coronary Angiography;  Surgeon: Lorretta Harp, MD;  Location: [facility];  Service: Cardiovascular;  Laterality: N/A;  . TUBAL LIGATION Bilateral 1992  . VAGINAL HYSTERECTOMY  2013    Social History   Social History  . Marital status: Married    Spouse name: N/A  . Number of children: 2  . Years of education: N/A   Occupational History  . UNEMPLOYED Unemployed    homemaker   Social History Main Topics  . Smoking status: Former Smoker    Years: 0.20    Types: Cigarettes    Quit date: 12/10/1990  . Smokeless tobacco: Never Used  . Alcohol use No  . Drug use: No  . Sexual activity: Yes   Other Topics Concern  . Not on file   Social History Narrative   Regular exercise- no    Diet- lots of mountain dew, limits fast foods     Allergies  Allergen Reactions  . Codeine Hives, Anxiety and Other (See  Comments)    Family History  Problem Relation Age of Onset  . Diabetes Other   . Heart disease Other   . Heart failure Mother   . Diabetes Maternal Grandmother   . Alzheimer's disease Maternal Grandmother   . Coronary artery disease Maternal Grandmother   . Heart attack Maternal Grandfather   . Heart failure Brother     Prior to Admission medications   Medication Sig Start Date End Date Taking? Authorizing Provider  aspirin 81  MG chewable tablet Chew 1 tablet (81 mg total) by mouth daily. 02/12/17  Yes Jani Gravel, MD  atorvastatin (LIPITOR) 80 MG tablet Take 1 tablet (80 mg total) by mouth daily at 6 PM. 09/04/16  Yes Amy E Bedsole, MD  cetirizine (ZYRTEC) 10 MG tablet Take 10 mg by mouth at bedtime. 03/04/17  Yes Historical Provider, MD  Continuous Blood Gluc Receiver (FREESTYLE LIBRE READER) DEVI 1 Device by Does not apply route 3 (three) times daily. 04/03/17  Yes Philemon Kingdom, MD  Continuous Blood Gluc Sensor (FREESTYLE LIBRE SENSOR SYSTEM) MISC 1 Device by Does not apply route every 30 (thirty) days. 04/03/17  Yes Philemon Kingdom, MD  ezetimibe (ZETIA) 10 MG tablet Take 1 tablet (10 mg total) by mouth daily. 09/11/16  Yes Amy Cletis Athens, MD  Insulin Glargine (BASAGLAR KWIKPEN) 100 UNIT/ML SOPN INJECT 55 UNITS INTO THE SKIN DAILY AT 10 PM. 04/03/17  Yes Philemon Kingdom, MD  insulin lispro (HUMALOG KWIKPEN) 100 UNIT/ML KiwkPen Inject 15-20 Units into the skin 3 (three) times daily.   Yes Historical Provider, MD  levothyroxine (SYNTHROID, LEVOTHROID) 150 MCG tablet Take 1 tablet (150 mcg total) by mouth daily. 04/03/17  Yes Philemon Kingdom, MD  metFORMIN (GLUCOPHAGE-XR) 500 MG 24 hr tablet Take 2 tablets (1,000 mg total) by mouth 2 (two) times daily after a meal. 04/03/17  Yes Philemon Kingdom, MD  metoprolol succinate (TOPROL-XL) 25 MG 24 hr tablet Take 25 mg by mouth daily. 03/24/17  Yes Historical Provider, MD  omeprazole (PRILOSEC) 40 MG capsule Take 1 capsule (40 mg total) by mouth 2 (two) times daily. 03/19/17  Yes Amy E Bedsole, MD  ondansetron (ZOFRAN) 4 MG tablet Take 4 mg by mouth every 6 (six) hours as needed for nausea or vomiting.   Yes Historical Provider, MD  RELPAX 40 MG tablet ONE TABLET BY MOUTH AT ONSET OF HEADACHE. MAY REPEAT IN 2 HOURS IF HEADACHE PERSISTS OR RECURS. 10/17/16  Yes Amy Cletis Athens, MD  sertraline (ZOLOFT) 50 MG tablet Take 1 tablet (50 mg total) by mouth daily. Patient taking differently: Take  50 mg by mouth at bedtime.  02/05/17  Yes Jinny Sanders, MD    Physical Exam: Vitals:   04/05/17 0945 04/05/17 1000 04/05/17 1015 04/05/17 1030  BP: 123/86 (!) 142/99 111/90 (!) 116/91  Pulse: (!) 102 (!) 105 (!) 114 (!) 108  Resp: 13 (!) 21 (!) 24 15  Temp:      TempSrc:      SpO2: 99% 99% 100% 100%  Weight:      Height:         General:  Appears slightly anxious and somewhat chronically ill. No acute distress Eyes:  PERRL, EOMI, normal lids, iris ENT:  grossly normal hearing, lips & tongue, because membranes of her mouth are pink somewhat dry Neck:  no LAD, masses or thyromegaly Cardiovascular:  RRR, no m/r/g. No LE edema.  Respiratory:  CTA bilaterally, no w/r/r. Normal respiratory effort. Abdomen:  soft, nondistended very  sluggish bowel sounds no guarding or rebounding Skin:  no rash or induration seen on limited exam Musculoskeletal:  grossly normal tone BUE/BLE, good ROM, no bony abnormality Psychiatric:  grossly normal mood and affect, speech fluent and appropriate, AOx3 Neurologic:  CN 2-12 grossly intact, moves all extremities in coordinated fashion, sensation intact  Labs on Admission: I have personally reviewed following labs and imaging studies  CBC:  Recent Labs Lab 04/05/17 0913  WBC 14.2*  HGB 15.8*  HCT 45.1  MCV 84.1  PLT 159   Basic Metabolic Panel:  Recent Labs Lab 04/05/17 0913  NA 136  K 4.1  CL 96*  CO2 24  GLUCOSE 352*  BUN 13  CREATININE 0.76  CALCIUM 10.0  MG 1.9   GFR: Estimated Creatinine Clearance: 64.4 mL/min (by C-G formula based on SCr of 0.76 mg/dL). Liver Function Tests:  Recent Labs Lab 04/05/17 0913  AST 21  ALT 12*  ALKPHOS 64  BILITOT 0.6  PROT 7.3  ALBUMIN 4.3   No results for input(s): LIPASE, AMYLASE in the last 168 hours. No results for input(s): AMMONIA in the last 168 hours. Coagulation Profile:  Recent Labs Lab 04/05/17 0913  INR 1.02   Cardiac Enzymes:  Recent Labs Lab 04/05/17 0913    TROPONINI <0.03   BNP (last 3 results) No results for input(s): PROBNP in the last 8760 hours. HbA1C:  Recent Labs  04/03/17 0957  HGBA1C 10.5*   CBG: No results for input(s): GLUCAP in the last 168 hours. Lipid Profile: No results for input(s): CHOL, HDL, LDLCALC, TRIG, CHOLHDL, LDLDIRECT in the last 72 hours. Thyroid Function Tests:  Recent Labs  04/03/17 0957  TSH 0.04*  FREET4 1.39   Anemia Panel: No results for input(s): VITAMINB12, FOLATE, FERRITIN, TIBC, IRON, RETICCTPCT in the last 72 hours. Urine analysis:    Component Value Date/Time   COLORURINE YELLOW 03/22/2017 2109   APPEARANCEUR CLEAR 03/22/2017 2109   LABSPEC 1.032 (H) 03/22/2017 2109   PHURINE 5.0 03/22/2017 2109   GLUCOSEU >=500 (A) 03/22/2017 2109   HGBUR NEGATIVE 03/22/2017 2109   BILIRUBINUR NEGATIVE 03/22/2017 2109   BILIRUBINUR negative 09/11/2016 1038   KETONESUR 20 (A) 03/22/2017 2109   PROTEINUR 100 (A) 03/22/2017 2109   UROBILINOGEN 0.2 09/11/2016 1038   UROBILINOGEN 0.2 10/28/2013 1723   NITRITE NEGATIVE 03/22/2017 2109   LEUKOCYTESUR NEGATIVE 03/22/2017 2109    Creatinine Clearance: Estimated Creatinine Clearance: 64.4 mL/min (by C-G formula based on SCr of 0.76 mg/dL).  Sepsis Labs: @LABRCNTIP (procalcitonin:4,lacticidven:4) )No results found for this or any previous visit (from the past 240 hour(s)).   Radiological Exams on Admission: Dg Chest 2 View  Result Date: 04/05/2017 CLINICAL DATA:  Left-sided chest pain extends to the back. EXAM: CHEST  2 VIEW COMPARISON:  None. FINDINGS: The heart size and mediastinal contours are within normal limits. Both lungs are clear. The visualized skeletal structures are unremarkable. IMPRESSION: Negative two view chest x-ray Electronically Signed   By: San Morelle M.D.   On: 04/05/2017 09:37    EKG: Independently reviewed. Sinus rhythm Borderline right axis deviation No significant change since last tracing Borderline  ECG  Assessment/Plan Principal Problem:   Chest pain Active Problems:   Hypothyroidism, acquired   B12 deficiency   Generalized anxiety disorder   GERD   Essential hypertension   Nausea   Kidney disease, chronic, stage 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)   DKA, type 2 ([name])   Nonischemic cardiomyopathy ([place])   Hyperglycemia   #1. Chest pain/elevated  troponin. Etiology unclear. Some typical and atypical features. Initial troponin 0.03 which is close to last month, EKG with sinus rhythm borderline right axis deviation, chest x-ray negative. Patient hospitalized last month with same underwent cardiac cath revealing normal coronary arteries and low normal LV function with mild anterior and apical hypokinesia. Echo with  Normal LV function with focal wall motion abnormalities. She was also evaluated by cardiology who opined at bedtime etiology for chest pain and mild troponin leak was undetermined -Admit to telemetry -Cycle cardiac enzymes -Serial EKG -lipid panel -asa and statin -Supportive therapy -gi cocktail  #2. Nausea, persistent. Likely related to uncontrolled diabetes. -Scheduled Zofran -Clear liquid diet -if no improvement consider gastric emptying study  #3. Hypertension. Fair control in the emergency department. Home medications include Toprol -continue with parameters  4. Diabetes type 2. Uncontrolled. History of noncompliance. Serum glucose 352. Hemoglobin A1c 2 days ago 10.5. Anion gap 16. Bicarb 24. Provided with 1 L normal saline in the emergency department -5 units insulin in ED -home long acting at slightly lower dose -Sliding scale insulin for optimal control -Clear liquid diet for now -Advanced a carb modified once #2 improved  #5. GERD. Appears stable at baseline. -continue home med  6. Anxiety disorder, generalized. -Continue home meds  #7. Chronic kidney disease stage 2. Left knee within the limits of normal on admission. -Gentle IV fluids -Monitor urine  output  #8. Nonischemic cardiomyopathy. See #1. No indications fluid overload.  -continue home meds   DVT prophylaxis: lovenox Code Status: full  Family Communication: husband at bedside  Disposition Plan: home  Consults called: none  Admission status: obs    Dyanne Carrel M MD [facility]  If 7PM-7AM, please contact night-coverage [email or web address] Password [facility]  04/05/2017, 12:02 PM

## 2017-04-05 NOTE — ED Triage Notes (Signed)
Pt in from home via [facility] with c/o 8/10 L sided cp that radiates to back. Per pt, pain started last night, has been vomitting, has HA as well. EMS gave 324 ASA, no NTG. Hx of bradycardia, DM, heart cath with stents. CBG 291, VSS

## 2017-04-05 NOTE — Progress Notes (Signed)
Pt arrived to unit from ED.  Telemetry applied and CCMD notified x2.  Pt oriented to room including call light and telephone.  Pt plan of care discussed and all questions answered.  Pt denies chest pain at this time.  Will cont to monitor.

## 2017-04-06 DIAGNOSIS — R079 Chest pain, unspecified: Secondary | ICD-10-CM | POA: Diagnosis not present

## 2017-04-06 DIAGNOSIS — I200 Unstable angina: Secondary | ICD-10-CM | POA: Diagnosis not present

## 2017-04-06 DIAGNOSIS — F411 Generalized anxiety disorder: Secondary | ICD-10-CM | POA: Diagnosis not present

## 2017-04-06 DIAGNOSIS — I208 Other forms of angina pectoris: Secondary | ICD-10-CM | POA: Diagnosis not present

## 2017-04-06 DIAGNOSIS — E538 Deficiency of other specified B group vitamins: Secondary | ICD-10-CM | POA: Diagnosis not present

## 2017-04-06 DIAGNOSIS — E1110 Type 2 diabetes mellitus with ketoacidosis without coma: Secondary | ICD-10-CM

## 2017-04-06 DIAGNOSIS — R0789 Other chest pain: Secondary | ICD-10-CM | POA: Diagnosis not present

## 2017-04-06 DIAGNOSIS — E039 Hypothyroidism, unspecified: Secondary | ICD-10-CM | POA: Diagnosis not present

## 2017-04-06 DIAGNOSIS — R1310 Dysphagia, unspecified: Secondary | ICD-10-CM | POA: Diagnosis not present

## 2017-04-06 DIAGNOSIS — I10 Essential (primary) hypertension: Secondary | ICD-10-CM | POA: Diagnosis not present

## 2017-04-06 LAB — GLUCOSE, CAPILLARY
Glucose-Capillary: 106 mg/dL — ABNORMAL HIGH (ref 65–99)
Glucose-Capillary: 97 mg/dL (ref 65–99)
Glucose-Capillary: 78 mg/dL (ref 65–99)
Glucose-Capillary: 104 mg/dL — ABNORMAL HIGH (ref 65–99)

## 2017-04-06 LAB — BASIC METABOLIC PANEL
ANION GAP: 10 (ref 5–15)
BUN: 11 mg/dL (ref 6–20)
CALCIUM: 8.8 mg/dL — AB (ref 8.9–10.3)
CO2: 26 mmol/L (ref 22–32)
Chloride: 102 mmol/L (ref 101–111)
Creatinine, Ser: 0.55 mg/dL (ref 0.44–1.00)
GFR calc non Af Amer: >60 mL/min (ref >60)
Glucose, Bld: 100 mg/dL — ABNORMAL HIGH (ref 65–99)
Potassium: 2.6 mmol/L — CL (ref 3.5–5.1)
SODIUM: 138 mmol/L (ref 135–145)

## 2017-04-06 LAB — CBC
HEMATOCRIT: 41 % (ref 36.0–46.0)
HEMOGLOBIN: 14 g/dL (ref 12.0–15.0)
MCH: 28.9 pg (ref 26.0–34.0)
MCHC: 34.1 g/dL (ref 30.0–36.0)
MCV: 84.7 fL (ref 78.0–100.0)
Platelets: 318 10*3/uL (ref 150–400)
RBC: 4.84 MIL/uL (ref 3.87–5.11)
RDW: 14.1 % (ref 11.5–15.5)
WBC: 12.1 10*3/uL — AB (ref 4.0–10.5)

## 2017-04-06 LAB — HEMOGLOBIN A1C
HEMOGLOBIN A1C: 10.8 % — AB (ref 4.8–5.6)
MEAN PLASMA GLUCOSE: 263 mg/dL

## 2017-04-06 LAB — MAGNESIUM: Magnesium: 2.2 mg/dL (ref 1.7–2.4)

## 2017-04-06 MED ORDER — MAGNESIUM SULFATE IN D5W 1-5 GM/100ML-% IV SOLN
1.0000 g | Freq: Once | INTRAVENOUS | Status: AC
  Administered 2017-04-06: 1 g via INTRAVENOUS
  Filled 2017-04-06: qty 100

## 2017-04-06 MED ORDER — POTASSIUM CHLORIDE 2 MEQ/ML IV SOLN
30.0000 meq | Freq: Once | INTRAVENOUS | Status: AC
  Administered 2017-04-06: 30 meq via INTRAVENOUS
  Filled 2017-04-06: qty 15

## 2017-04-06 MED ORDER — PROMETHAZINE HCL 25 MG/ML IJ SOLN: 12.5000 mg | Freq: Four times a day (QID) | INTRAMUSCULAR | Status: DC | PRN

## 2017-04-06 MED ORDER — POTASSIUM CHLORIDE CRYS ER 20 MEQ PO TBCR
40.0000 meq | EXTENDED_RELEASE_TABLET | Freq: Once | ORAL | Status: AC
  Administered 2017-04-06: 40 meq via ORAL
  Filled 2017-04-06: qty 2

## 2017-04-06 MED ORDER — ONDANSETRON HCL 4 MG/2ML IJ SOLN
4.0000 mg | Freq: Four times a day (QID) | INTRAMUSCULAR | Status: DC | PRN
  Administered 2017-04-06 – 2017-04-07 (×2): 4 mg via INTRAVENOUS
  Filled 2017-04-06 (×2): qty 2

## 2017-04-06 MED ORDER — POTASSIUM CHLORIDE IN NACL 20-0.9 MEQ/L-% IV SOLN
INTRAVENOUS | Status: DC
  Administered 2017-04-06 – 2017-04-08 (×4): via INTRAVENOUS
  Filled 2017-04-06 (×5): qty 1000

## 2017-04-06 MED ORDER — PANTOPRAZOLE SODIUM 40 MG PO TBEC
40.0000 mg | DELAYED_RELEASE_TABLET | Freq: Every day | ORAL | Status: DC
  Administered 2017-04-06 – 2017-04-07 (×2): 40 mg via ORAL
  Filled 2017-04-06 (×2): qty 1

## 2017-04-06 MED ORDER — POTASSIUM CHLORIDE CRYS ER 20 MEQ PO TBCR
20.0000 meq | EXTENDED_RELEASE_TABLET | Freq: Once | ORAL | Status: AC
  Administered 2017-04-06: 20 meq via ORAL
  Filled 2017-04-06: qty 1

## 2017-04-06 NOTE — Progress Notes (Signed)
[facility] PROGRESS NOTE  [name] [name] JKK:[id number] DOB: 1961-04-23 DOA: 04/05/2017   PCP: Eliezer Lofts, MD     Assessment/Plan: Principal Problem:   Chest pain Active Problems:   Hypothyroidism, acquired   B12 deficiency   Generalized anxiety disorder   GERD   Essential hypertension   Nausea   Kidney disease, chronic, stage 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)   DKA, type 2 ([name])   Nonischemic cardiomyopathy ([place])   Hyperglycemia   56 y.o. female with medical history significant for some dependent diabetes with frequent DKA, nonischemic cardiomyopathy with EF 35%, recent H pylori, hypothyroidism. GERD, dylipidemia and anxiety disorder presents to emergency Department chief complaint persistent nausea vomiting with intermittent chest pain. She had some hematemesis , previous admission.  Assessment/Plan #1. Chest pain/elevated troponin. Suspect esophageal etiology.  Cardiac enzymes negative, EKG with sinus rhythm borderline right axis deviation, chest x-ray negative. Patient hospitalized last month with same, underwent cardiac cath revealing normal coronary arteries and low normal LV function with mild anterior and apical hypokinesia. Echo with  Normal LV function with focal wall motion abnormalities. She was also evaluated by cardiology who opined at bedtime etiology for chest pain and mild troponin leak was undetermined Telemetry uneventful Symptoms more consistent with dyspepsia, barium esophagogram and gastric emptying study   #2. Nausea, persistent. Gastroparesis vs dyspepsia Started on PPI Gastric emptying study, barium esophagogram will be performed given recent hematemesis during last admission No evidence of DKA  #3. Hypertension. Fair control in the emergency department. Home medications include Toprol -continue with parameters  4. Diabetes type 2. Uncontrolled. History of noncompliance. Serum glucose 352. Hemoglobin A1c 2 days ago 10.5. Anion gap 16>10. Bicarb  24. Provided with 1 L normal saline in the emergency department  Continue Lantus, SSI  #5. GERD. Appears stable at baseline. -continue home med  6. Anxiety disorder, generalized. -Continue home meds  #7. Chronic kidney disease stage 2. Left knee within the limits of normal on admission. -Gentle IV fluids -Monitor urine output  #8. Nonischemic cardiomyopathy. See #1. No indications fluid overload.  -continue home meds    DVT prophylaxsis lovenox  Code Status:  Full code     Family Communication: Discussed in detail with the patient, all imaging results, lab results explained to the patient   Disposition Plan:  1-2 days      Consultants:  none  Procedures:  None   Antibiotics: Anti-infectives    None         HPI/Subjective: Denies any ongoing chest pain or shortness of breath  Objective: Vitals:   04/05/17 1045 04/05/17 1423 04/05/17 2108 04/06/17 0533  BP: (!) 151/100 134/76 (!) 93/57 94/64  Pulse: (!) 114 88 83 79  Resp: (!) 31 18 18 18   Temp:  97.8 F (36.6 C) 98.4 F (36.9 C) 98.3 F (36.8 C)  TempSrc:  Oral Oral Oral  SpO2: 100% 100% 100% 100%  Weight:  54.2 kg (119 lb 7.8 oz)    Height:  5' (1.524 m)      Intake/Output Summary (Last 24 hours) at 04/06/17 0933 Last data filed at 04/06/17 0200  Gross per 24 hour  Intake             1480 ml  Output              200 ml  Net             1280 ml    Exam:  Examination:  General exam: Appears calm and comfortable  Respiratory system: Clear to auscultation. Respiratory effort normal. Cardiovascular system: S1 & S2 heard, RRR. No JVD, murmurs, rubs, gallops or clicks. No pedal edema. Gastrointestinal system: Abdomen is nondistended, soft and nontender. No organomegaly or masses felt. Normal bowel sounds heard. Central nervous system: Alert and oriented. No focal neurological deficits. Extremities: Symmetric 5 x 5 power. Skin: No rashes, lesions or ulcers Psychiatry: Judgement and  insight appear normal. Mood & affect appropriate.     Data Reviewed: I have personally reviewed following labs and imaging studies  Micro Results No results found for this or any previous visit (from the past 240 hour(s)).  Radiology Reports Dg Chest 2 View  Result Date: 04/05/2017 CLINICAL DATA:  Left-sided chest pain extends to the back. EXAM: CHEST  2 VIEW COMPARISON:  None. FINDINGS: The heart size and mediastinal contours are within normal limits. Both lungs are clear. The visualized skeletal structures are unremarkable. IMPRESSION: Negative two view chest x-ray Electronically Signed   By: San Morelle M.D.   On: 04/05/2017 09:37   Portable Chest X-ray (1 View)  Result Date: 03/24/2017 CLINICAL DATA:  Acute onset of diabetic ketoacidosis. Initial encounter. EXAM: PORTABLE CHEST 1 VIEW COMPARISON:  Chest radiograph performed 02/09/2017 FINDINGS: The lungs are well-aerated and clear. There is no evidence of focal opacification, pleural effusion or pneumothorax. The cardiomediastinal silhouette is within normal limits. No acute osseous abnormalities are seen. IMPRESSION: No acute cardiopulmonary process seen. Electronically Signed   By: Garald Balding M.D.   On: 03/24/2017 05:04     CBC  Recent Labs Lab 04/05/17 0913 04/06/17 0245  WBC 14.2* 12.1*  HGB 15.8* 14.0  HCT 45.1 41.0  PLT 387 318  MCV 84.1 84.7  MCH 29.5 28.9  MCHC 35.0 34.1  RDW 14.2 14.1    Chemistries   Recent Labs Lab 04/05/17 0913 04/06/17 0245  NA 136 138  K 4.1 2.6*  CL 96* 102  CO2 24 26  GLUCOSE 352* 100*  BUN 13 11  CREATININE 0.76 0.55  CALCIUM 10.0 8.8*  MG 1.9  --   AST 21  --   ALT 12*  --   ALKPHOS 64  --   BILITOT 0.6  --    ------------------------------------------------------------------------------------------------------------------ estimated creatinine clearance is 58.4 mL/min (by C-G formula based on SCr of 0.55  mg/dL). ------------------------------------------------------------------------------------------------------------------  Recent Labs  04/03/17 0957 04/05/17 1149  HGBA1C 10.5* 10.8*   ------------------------------------------------------------------------------------------------------------------  Recent Labs  04/05/17 1149  CHOL 253*  HDL 51  LDLCALC 179*  TRIG 115  CHOLHDL 5.0   ------------------------------------------------------------------------------------------------------------------  Recent Labs  04/03/17 0957  TSH 0.04*   ------------------------------------------------------------------------------------------------------------------ No results for input(s): VITAMINB12, FOLATE, FERRITIN, TIBC, IRON, RETICCTPCT in the last 72 hours.  Coagulation profile  Recent Labs Lab 04/05/17 0913  INR 1.02    No results for input(s): DDIMER in the last 72 hours.  Cardiac Enzymes  Recent Labs Lab 04/05/17 1228 04/05/17 1437 04/05/17 1817  TROPONINI <0.03 <0.03 <0.03   ------------------------------------------------------------------------------------------------------------------ Invalid input(s): POCBNP   CBG:  Recent Labs Lab 04/05/17 1240 04/05/17 1639 04/05/17 2104 04/06/17 0618  GLUCAP 271* 196* 209* 106*       Studies: Dg Chest 2 View  Result Date: 04/05/2017 CLINICAL DATA:  Left-sided chest pain extends to the back. EXAM: CHEST  2 VIEW COMPARISON:  None. FINDINGS: The heart size and mediastinal contours are within normal limits. Both lungs are clear. The visualized skeletal structures are unremarkable. IMPRESSION: Negative two  view chest x-ray Electronically Signed   By: San Morelle M.D.   On: 04/05/2017 09:37      Lab Results  Component Value Date   HGBA1C 10.8 (H) 04/05/2017   HGBA1C 10.5 (H) 04/03/2017   HGBA1C 12.3 (H) 01/22/2017   Lab Results  Component Value Date   MICROALBUR 1.6 04/26/2015   LDLCALC 179 (H)  04/05/2017   CREATININE 0.55 04/06/2017       Scheduled Meds: . aspirin EC  325 mg Oral Daily  . atorvastatin  80 mg Oral q1800  . enoxaparin (LOVENOX) injection  40 mg Subcutaneous Q24H  . ezetimibe  10 mg Oral Daily  . insulin aspart  0-5 Units Subcutaneous QHS  . insulin aspart  0-9 Units Subcutaneous TID WC  . insulin glargine  40 Units Subcutaneous QHS  . levothyroxine  150 mcg Oral QAC breakfast  . metoprolol succinate  25 mg Oral Daily  . ondansetron (ZOFRAN) IV  4 mg Intravenous Q4H  . pantoprazole  40 mg Oral Daily  . potassium chloride  40 mEq Oral Once  . sertraline  50 mg Oral QHS   Continuous Infusions:   LOS: 0 days    Time spent: >30 MINS    Reyne Dumas  [facility] Pager [phone]. If 7PM-7AM, please contact night-coverage at [email or web address], password [facility] 04/06/2017, 9:33 AM  LOS: 0 days

## 2017-04-06 NOTE — Progress Notes (Signed)
CRITICAL VALUE ALERT  Critical value received:  K+ 2.6.    MD Opyd paged/notified.  Orders received and implemented.  RN will continue to monitor.

## 2017-04-07 DIAGNOSIS — E1110 Type 2 diabetes mellitus with ketoacidosis without coma: Secondary | ICD-10-CM | POA: Diagnosis not present

## 2017-04-07 DIAGNOSIS — E538 Deficiency of other specified B group vitamins: Secondary | ICD-10-CM | POA: Diagnosis not present

## 2017-04-07 DIAGNOSIS — I208 Other forms of angina pectoris: Secondary | ICD-10-CM

## 2017-04-07 DIAGNOSIS — F411 Generalized anxiety disorder: Secondary | ICD-10-CM | POA: Diagnosis not present

## 2017-04-07 DIAGNOSIS — E039 Hypothyroidism, unspecified: Secondary | ICD-10-CM | POA: Diagnosis not present

## 2017-04-07 DIAGNOSIS — R1310 Dysphagia, unspecified: Secondary | ICD-10-CM | POA: Diagnosis not present

## 2017-04-07 DIAGNOSIS — R079 Chest pain, unspecified: Secondary | ICD-10-CM | POA: Diagnosis not present

## 2017-04-07 LAB — GLUCOSE, CAPILLARY
GLUCOSE-CAPILLARY: 79 mg/dL (ref 65–99)
GLUCOSE-CAPILLARY: 115 mg/dL — AB (ref 65–99)
Glucose-Capillary: 60 mg/dL — ABNORMAL LOW (ref 65–99)
Glucose-Capillary: 246 mg/dL — ABNORMAL HIGH (ref 65–99)
Glucose-Capillary: 94 mg/dL (ref 65–99)

## 2017-04-07 LAB — CBC
HCT: 38.9 % (ref 36.0–46.0)
Hemoglobin: 13.3 g/dL (ref 12.0–15.0)
MCH: 29.5 pg (ref 26.0–34.0)
MCHC: 34.2 g/dL (ref 30.0–36.0)
MCV: 86.3 fL (ref 78.0–100.0)
PLATELETS: 304 10*3/uL (ref 150–400)
RBC: 4.51 MIL/uL (ref 3.87–5.11)
RDW: 14.1 % (ref 11.5–15.5)
WBC: 11 10*3/uL — ABNORMAL HIGH (ref 4.0–10.5)

## 2017-04-07 LAB — COMPREHENSIVE METABOLIC PANEL
ALBUMIN: 3.1 g/dL — AB (ref 3.5–5.0)
ALK PHOS: 48 U/L (ref 38–126)
ALT: 11 U/L — AB (ref 14–54)
AST: 15 U/L (ref 15–41)
Anion gap: 8 (ref 5–15)
BUN: 5 mg/dL — ABNORMAL LOW (ref 6–20)
CALCIUM: 8.6 mg/dL — AB (ref 8.9–10.3)
CO2: 24 mmol/L (ref 22–32)
CREATININE: 0.58 mg/dL (ref 0.44–1.00)
Chloride: 107 mmol/L (ref 101–111)
GFR calc Af Amer: >60 mL/min (ref >60)
GFR calc non Af Amer: >60 mL/min (ref >60)
GLUCOSE: 76 mg/dL (ref 65–99)
Potassium: 4.2 mmol/L (ref 3.5–5.1)
SODIUM: 139 mmol/L (ref 135–145)
Total Bilirubin: 0.6 mg/dL (ref 0.3–1.2)
Total Protein: 5.6 g/dL — ABNORMAL LOW (ref 6.5–8.1)

## 2017-04-07 NOTE — Care Management CC44 (Signed)
Condition Code 44 Documentation Completed  Patient Details  Name: LITSY EPTING MRN: [id number] Date of Birth: 1961-11-18   Condition Code 44 given:  Yes Patient signature on Condition Code 44 notice:  Yes Documentation of 2 MD's agreement:  Yes Code 44 added to claim:  Yes    Amiree No, Antony Haste, RN 04/07/2017, 3:52 PM

## 2017-04-07 NOTE — Care Management Obs Status (Signed)
[place] NOTIFICATION   Patient Details  Name: TERRANCE LANAHAN MRN: [id number] Date of Birth: 11-Oct-1961   Medicare Observation Status Notification Given:  Yes    CrutchfieldAntony Haste, RN 04/07/2017, 3:52 PM

## 2017-04-07 NOTE — Progress Notes (Signed)
[facility] PROGRESS NOTE  [name] [name] JOA:[id number] DOB: 19-Nov-1961 DOA: 04/05/2017   PCP: Eliezer Lofts, MD     Assessment/Plan: Principal Problem:   Chest pain Active Problems:   Hypothyroidism, acquired   B12 deficiency   Generalized anxiety disorder   GERD   Essential hypertension   Nausea   Kidney disease, chronic, stage 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)   DKA, type 2 ([name])   Nonischemic cardiomyopathy ([place])   Hyperglycemia   Dysphagia   56 y.o. female with medical history significant for some dependent diabetes with frequent DKA, nonischemic cardiomyopathy with EF 35%, recent H pylori, hypothyroidism. GERD, dylipidemia and anxiety disorder presents to emergency Department chief complaint persistent nausea vomiting with intermittent chest pain. She had some hematemesis , previous admission.  Assessment/Plan #1. Chest pain/elevated troponin. Suspect esophageal etiology.  Cardiac enzymes negative, EKG with sinus rhythm borderline right axis deviation, chest x-ray negative. Patient hospitalized last month with same, underwent cardiac cath revealing normal coronary arteries and low normal LV function with mild anterior and apical hypokinesia. Echo with  Normal LV function with focal wall motion abnormalities. She was also evaluated by cardiology who opined  that etiology for chest pain and mild troponin leak was undetermined Telemetry uneventful Symptoms more consistent with dyspepsia, barium esophagogram and gastric emptying study Troponin negative  #2. Nausea, persistent. Gastroparesis vs dyspepsia Started on PPI Gastric emptying study, barium esophagogram will be performed given recent hematemesis during last admission No evidence of DKA  #3. Hypertension. Fair control in the emergency department. Home medications include Toprol -continue with parameters  4. Diabetes type 2. Uncontrolled. History of noncompliance.  Hemoglobin A1c 2 days ago 10.5. Anion gap 16>10.  Bicarb 24. Provided with 1 L normal saline in the emergency department CBG now stable, continue Lantus and SSI  #5. GERD. Appears stable at baseline. -continue home med  6. Anxiety disorder, generalized. -Continue home meds  #7. Chronic kidney disease stage 2. Left knee within the limits of normal on admission. -Gentle IV fluids -Monitor urine output  #8. Nonischemic cardiomyopathy. See #1. No indications fluid overload.  -continue home meds    DVT prophylaxsis lovenox  Code Status:  Full code     Family Communication: Discussed in detail with the patient, all imaging results, lab results explained to the patient   Disposition Plan:  Anticipate discharge tomorrow if workup is negative     Consultants:  none  Procedures:  None   Antibiotics: Anti-infectives    None         HPI/Subjective: Denies any ongoing chest pain or shortness of breath  Objective: Vitals:   04/07/17 0455 04/07/17 0518 04/07/17 0520 04/07/17 0923  BP: (!) 85/57 (!) 86/54 (!) 102/55 112/65  Pulse: 69   71  Resp: 16     Temp: 97 F (36.1 C)     TempSrc: Oral     SpO2: 100%     Weight:      Height:        Intake/Output Summary (Last 24 hours) at 04/07/17 0957 Last data filed at 04/07/17 0631  Gross per 24 hour  Intake          1523.75 ml  Output             1000 ml  Net           523.75 ml    Exam:  Examination:  General exam: Appears calm and comfortable  Respiratory system: Clear to  auscultation. Respiratory effort normal. Cardiovascular system: S1 & S2 heard, RRR. No JVD, murmurs, rubs, gallops or clicks. No pedal edema. Gastrointestinal system: Abdomen is nondistended, soft and nontender. No organomegaly or masses felt. Normal bowel sounds heard. Central nervous system: Alert and oriented. No focal neurological deficits. Extremities: Symmetric 5 x 5 power. Skin: No rashes, lesions or ulcers Psychiatry: Judgement and insight appear normal. Mood & affect  appropriate.     Data Reviewed: I have personally reviewed following labs and imaging studies  Micro Results No results found for this or any previous visit (from the past 240 hour(s)).  Radiology Reports Dg Chest 2 View  Result Date: 04/05/2017 CLINICAL DATA:  Left-sided chest pain extends to the back. EXAM: CHEST  2 VIEW COMPARISON:  None. FINDINGS: The heart size and mediastinal contours are within normal limits. Both lungs are clear. The visualized skeletal structures are unremarkable. IMPRESSION: Negative two view chest x-ray Electronically Signed   By: San Morelle M.D.   On: 04/05/2017 09:37   Portable Chest X-ray (1 View)  Result Date: 03/24/2017 CLINICAL DATA:  Acute onset of diabetic ketoacidosis. Initial encounter. EXAM: PORTABLE CHEST 1 VIEW COMPARISON:  Chest radiograph performed 02/09/2017 FINDINGS: The lungs are well-aerated and clear. There is no evidence of focal opacification, pleural effusion or pneumothorax. The cardiomediastinal silhouette is within normal limits. No acute osseous abnormalities are seen. IMPRESSION: No acute cardiopulmonary process seen. Electronically Signed   By: Garald Balding M.D.   On: 03/24/2017 05:04     CBC  Recent Labs Lab 04/05/17 0913 04/06/17 0245 04/07/17 0214  WBC 14.2* 12.1* 11.0*  HGB 15.8* 14.0 13.3  HCT 45.1 41.0 38.9  PLT 387 318 304  MCV 84.1 84.7 86.3  MCH 29.5 28.9 29.5  MCHC 35.0 34.1 34.2  RDW 14.2 14.1 14.1    Chemistries   Recent Labs Lab 04/05/17 0913 04/06/17 0245 04/07/17 0214  NA 136 138 139  K 4.1 2.6* 4.2  CL 96* 102 107  CO2 24 26 24   GLUCOSE 352* 100* 76  BUN 13 11 5*  CREATININE 0.76 0.55 0.58  CALCIUM 10.0 8.8* 8.6*  MG 1.9 2.2  --   AST 21  --  15  ALT 12*  --  11*  ALKPHOS 64  --  48  BILITOT 0.6  --  0.6   ------------------------------------------------------------------------------------------------------------------ estimated creatinine clearance is 58.4 mL/min (by C-G  formula based on SCr of 0.58 mg/dL). ------------------------------------------------------------------------------------------------------------------  Recent Labs  04/05/17 1149  HGBA1C 10.8*   ------------------------------------------------------------------------------------------------------------------  Recent Labs  04/05/17 1149  CHOL 253*  HDL 51  LDLCALC 179*  TRIG 115  CHOLHDL 5.0   ------------------------------------------------------------------------------------------------------------------ No results for input(s): TSH, T4TOTAL, T3FREE, THYROIDAB in the last 72 hours.  Invalid input(s): FREET3 ------------------------------------------------------------------------------------------------------------------ No results for input(s): VITAMINB12, FOLATE, FERRITIN, TIBC, IRON, RETICCTPCT in the last 72 hours.  Coagulation profile  Recent Labs Lab 04/05/17 0913  INR 1.02    No results for input(s): DDIMER in the last 72 hours.  Cardiac Enzymes  Recent Labs Lab 04/05/17 1228 04/05/17 1437 04/05/17 1817  TROPONINI <0.03 <0.03 <0.03   ------------------------------------------------------------------------------------------------------------------ Invalid input(s): POCBNP   CBG:  Recent Labs Lab 04/06/17 0618 04/06/17 1153 04/06/17 1632 04/06/17 2055 04/07/17 0524  GLUCAP 106* 97 78 104* 79       Studies: No results found.    Lab Results  Component Value Date   HGBA1C 10.8 (H) 04/05/2017   HGBA1C 10.5 (H) 04/03/2017   HGBA1C 12.3 (H) 01/22/2017  Lab Results  Component Value Date   MICROALBUR 1.6 04/26/2015   LDLCALC 179 (H) 04/05/2017   CREATININE 0.58 04/07/2017       Scheduled Meds: . aspirin EC  325 mg Oral Daily  . atorvastatin  80 mg Oral q1800  . enoxaparin (LOVENOX) injection  40 mg Subcutaneous Q24H  . ezetimibe  10 mg Oral Daily  . insulin aspart  0-5 Units Subcutaneous QHS  . insulin aspart  0-9 Units  Subcutaneous TID WC  . insulin glargine  40 Units Subcutaneous QHS  . levothyroxine  150 mcg Oral QAC breakfast  . metoprolol succinate  25 mg Oral Daily  . pantoprazole  40 mg Oral Daily  . sertraline  50 mg Oral QHS   Continuous Infusions: . 0.9 % NaCl with KCl 20 mEq / L 75 mL/hr at 04/06/17 2359     LOS: 0 days    Time spent: >30 MINS    Reyne Dumas  [facility] Pager [phone]. If 7PM-7AM, please contact night-coverage at [email or web address], password [facility] 04/07/2017, 9:57 AM  LOS: 0 days

## 2017-04-08 ENCOUNTER — Observation Stay (HOSPITAL_COMMUNITY): Payer: 59

## 2017-04-08 DIAGNOSIS — I10 Essential (primary) hypertension: Secondary | ICD-10-CM | POA: Diagnosis not present

## 2017-04-08 DIAGNOSIS — I200 Unstable angina: Secondary | ICD-10-CM | POA: Diagnosis not present

## 2017-04-08 DIAGNOSIS — E1110 Type 2 diabetes mellitus with ketoacidosis without coma: Secondary | ICD-10-CM | POA: Diagnosis not present

## 2017-04-08 LAB — GLUCOSE, CAPILLARY
GLUCOSE-CAPILLARY: 81 mg/dL (ref 65–99)
GLUCOSE-CAPILLARY: 104 mg/dL — AB (ref 65–99)
GLUCOSE-CAPILLARY: 129 mg/dL — AB (ref 65–99)

## 2017-04-08 MED ORDER — PANTOPRAZOLE SODIUM 40 MG PO TBEC: 40.0000 mg | DELAYED_RELEASE_TABLET | Freq: Every day | ORAL | 1 refills | Status: DC

## 2017-04-08 MED ORDER — POTASSIUM CHLORIDE CRYS ER 20 MEQ PO TBCR: 60.0000 meq | EXTENDED_RELEASE_TABLET | Freq: Once | ORAL | Status: DC

## 2017-04-08 MED ORDER — TECHNETIUM TC 99M SULFUR COLLOID: 2.0000 | Freq: Once | INTRAVENOUS | Status: DC | PRN

## 2017-04-08 MED ORDER — TECHNETIUM TC 99M SULFUR COLLOID
2.0000 | Freq: Once | INTRAVENOUS | Status: AC | PRN
  Administered 2017-04-08: 2 via ORAL

## 2017-04-08 MED ORDER — INSULIN LISPRO 100 UNIT/ML (KWIKPEN): 8.0000 [IU] | PEN_INJECTOR | Freq: Three times a day (TID) | SUBCUTANEOUS | 11 refills | Status: DC

## 2017-04-08 MED ORDER — BASAGLAR KWIKPEN 100 UNIT/ML ~~LOC~~ SOPN: 45.0000 [IU] | PEN_INJECTOR | Freq: Every day | SUBCUTANEOUS | 1 refills | Status: DC

## 2017-04-08 NOTE — Progress Notes (Signed)
04/08/2017 6:12 PM Discharge AVS meds taken today and those due this evening reviewed.  Follow-up appointments and when to call md reviewed.  D/C IV and TELE.  Questions and concerns addressed.   D/C home per orders. [place]

## 2017-04-08 NOTE — Discharge Summary (Signed)
Physician Discharge Summary  [name] [name] MRN: [id number] DOB/AGE: [birth year] 56 y.o.  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 Qty: 30 tablet, Refills: 1      CONTINUE these medications which have CHANGED   Details  Insulin Glargine (BASAGLAR KWIKPEN) 100 UNIT/ML SOPN Inject 0.45 mLs (45 Units total) into the skin daily at 10 pm. INJECT 55 UNITS INTO THE SKIN DAILY AT 10 PM. Qty: 45 pen, Refills: 1    insulin lispro (HUMALOG KWIKPEN) 100 UNIT/ML KiwkPen Inject 0.08 mLs (8 Units total) into the skin 3 (three) times daily. Qty: 15 mL, Refills: 11      CONTINUE these medications which have NOT CHANGED   Details  aspirin 81 MG chewable tablet Chew 1 tablet (81 mg total) by mouth daily. Qty: 30 tablet, Refills: 0    atorvastatin (LIPITOR) 80 MG tablet Take 1 tablet (80 mg total) by mouth daily at 6 PM. Qty: 90 tablet, Refills: 0    cetirizine (ZYRTEC) 10 MG tablet Take 10 mg by mouth at bedtime. Refills: 11    Continuous Blood Gluc Receiver (FREESTYLE LIBRE READER) DEVI 1 Device by Does not apply route 3 (three) times daily. Qty: 1 Device, Refills: 1    Continuous Blood Gluc Sensor (FREESTYLE LIBRE SENSOR SYSTEM) MISC 1 Device by Does not apply  route every 30 (thirty) days. Qty: 3 each, Refills: 11    ezetimibe (ZETIA) 10 MG tablet Take 1 tablet (10 mg total) by mouth daily. Qty: 30 tablet, Refills: 11    levothyroxine (SYNTHROID, LEVOTHROID) 150 MCG tablet Take 1 tablet (150 mcg total) by mouth daily. Qty: 90 tablet, Refills: 3    metoprolol succinate (TOPROL-XL) 25 MG 24 hr tablet Take 25 mg by mouth daily. Refills: 12    omeprazole (PRILOSEC) 40 MG capsule Take 1 capsule (40 mg total) by mouth 2 (two) times daily. Qty: 60 capsule, Refills: 1    ondansetron (ZOFRAN) 4 MG tablet Take 4 mg by mouth every 6 (six) hours as needed for nausea or vomiting.    RELPAX 40 MG tablet ONE TABLET BY MOUTH AT ONSET OF HEADACHE. MAY REPEAT IN 2 HOURS IF HEADACHE PERSISTS OR RECURS. Qty: 10 tablet, Refills: 2    sertraline (ZOLOFT) 50 MG tablet Take 1 tablet (50 mg total) by mouth daily. Qty: 30 tablet, Refills: 3      STOP taking these medications     metFORMIN (GLUCOPHAGE-XR) 500 MG 24 hr tablet          Discharge Condition:  Stable   Discharge Instructions Get Medicines reviewed and adjusted: Please take all your medications with you for your next visit with your Primary MD  Please request your Primary MD  to go over all hospital tests and procedure/radiological results at the follow up, please ask your Primary MD to get all Hospital records sent to his/her office.  If you experience worsening of your admission symptoms, develop shortness of breath, life threatening emergency, suicidal or homicidal thoughts you must seek medical attention immediately by calling 911 or calling your MD immediately if symptoms less severe.  You must read complete instructions/literature along with all the possible adverse reactions/side effects for all the Medicines you take and that have been prescribed to you. Take any new Medicines after you have completely understood and accpet all the possible adverse reactions/side effects.   Do not  drive when taking Pain medications.   Do not take more than prescribed Pain, Sleep and Anxiety Medications  Special Instructions: If you have smoked or chewed Tobacco in the last 2 yrs please stop smoking, stop any regular Alcohol and or any Recreational drug use.  Wear Seat belts while driving.  Please note  You were cared for by a hospitalist during your hospital stay. Once you are discharged, your primary care physician will handle any further medical issues. Please note that NO REFILLS for any discharge medications will be authorized once you are discharged, as it is imperative that you return to your primary care physician (or establish a relationship with a primary care physician if you do not have one) for your aftercare needs so that they can reassess your need for medications and monitor your lab values.     Allergies  Allergen Reactions  . Codeine Hives, Anxiety and Other (See Comments)      Disposition: 01-Home or Self Care   Consults:  Cardiology    Significant Diagnostic Studies:  Dg Chest 2 View  Result Date: 04/05/2017 CLINICAL DATA:  Left-sided chest pain extends to the back. EXAM: CHEST  2 VIEW COMPARISON:  None. FINDINGS: The heart size and mediastinal contours are within normal limits. Both lungs are clear. The visualized skeletal structures are unremarkable. IMPRESSION: Negative two view chest x-ray Electronically Signed   By: San Morelle M.D.   On: 04/05/2017 09:37   Portable Chest X-ray (1 View)  Result Date: 03/24/2017 CLINICAL DATA:  Acute onset of diabetic ketoacidosis. Initial encounter. EXAM: PORTABLE CHEST 1 VIEW COMPARISON:  Chest radiograph performed 02/09/2017 FINDINGS: The lungs are well-aerated and clear. There is no evidence of focal opacification, pleural effusion or pneumothorax. The cardiomediastinal silhouette is within normal limits. No acute osseous abnormalities are seen. IMPRESSION: No acute cardiopulmonary process seen.  Electronically Signed   By: Garald Balding M.D.   On: 03/24/2017 05:04    echocardiogram        Filed Weights   04/05/17 0853 04/05/17 0857 04/05/17 1423  Weight: 57.2 kg (126 lb) 57.2 kg (126 lb) 54.2 kg (119 lb 7.8 oz)     Microbiology: No results found for this or any previous visit (from the past 240 hour(s)).     Blood Culture    Component Value Date/Time   SDES URINE, CATHETERIZED 02/12/2008 1131   SPECREQUEST IMMUNE:NORM UT SYMPT:NEG 02/12/2008 1131   CULT ESCHERICHIA COLI KLEBSIELLA PNEUMONIAE 02/12/2008 1131   REPTSTATUS 02/16/2008 FINAL 02/12/2008 1131      Labs: Results for orders placed or performed during the hospital encounter of 04/05/17 (from the past 48 hour(s))  Glucose, capillary     Status: None   Collection Time: 04/06/17 11:53 AM  Result Value Ref Range   Glucose-Capillary 97 65 - 99 mg/dL  Glucose, capillary  Status: None   Collection Time: 04/06/17  4:32 PM  Result Value Ref Range   Glucose-Capillary 78 65 - 99 mg/dL  Glucose, capillary     Status: Abnormal   Collection Time: 04/06/17  8:55 PM  Result Value Ref Range   Glucose-Capillary 104 (H) 65 - 99 mg/dL   Comment 1 Notify RN   Comprehensive metabolic panel     Status: Abnormal   Collection Time: 04/07/17  2:14 AM  Result Value Ref Range   Sodium 139 135 - 145 mmol/L   Potassium 4.2 3.5 - 5.1 mmol/L    Comment: DELTA CHECK NOTED   Chloride 107 101 - 111 mmol/L   CO2 24 22 - 32 mmol/L   Glucose, Bld 76 65 - 99 mg/dL   BUN 5 (L) 6 - 20 mg/dL   Creatinine, Ser 0.58 0.44 - 1.00 mg/dL   Calcium 8.6 (L) 8.9 - 10.3 mg/dL   Total Protein 5.6 (L) 6.5 - 8.1 g/dL   Albumin 3.1 (L) 3.5 - 5.0 g/dL   AST 15 15 - 41 U/L   ALT 11 (L) 14 - 54 U/L   Alkaline Phosphatase 48 38 - 126 U/L   Total Bilirubin 0.6 0.3 - 1.2 mg/dL   GFR calc non Af Amer >60 >60 mL/min   GFR calc Af Amer >60 >60 mL/min    Comment: (NOTE) The eGFR has been calculated using the CKD EPI equation. This calculation  has not been validated in all clinical situations. eGFR's persistently <60 mL/min signify possible Chronic Kidney Disease.    Anion gap 8 5 - 15  CBC     Status: Abnormal   Collection Time: 04/07/17  2:14 AM  Result Value Ref Range   WBC 11.0 (H) 4.0 - 10.5 K/uL   RBC 4.51 3.87 - 5.11 MIL/uL   Hemoglobin 13.3 12.0 - 15.0 g/dL   HCT 38.9 36.0 - 46.0 %   MCV 86.3 78.0 - 100.0 fL   MCH 29.5 26.0 - 34.0 pg   MCHC 34.2 30.0 - 36.0 g/dL   RDW 14.1 11.5 - 15.5 %   Platelets 304 150 - 400 K/uL  Glucose, capillary     Status: None   Collection Time: 04/07/17  5:24 AM  Result Value Ref Range   Glucose-Capillary 79 65 - 99 mg/dL  Glucose, capillary     Status: Abnormal   Collection Time: 04/07/17 11:27 AM  Result Value Ref Range   Glucose-Capillary 60 (L) 65 - 99 mg/dL  Glucose, capillary     Status: Abnormal   Collection Time: 04/07/17  1:31 PM  Result Value Ref Range   Glucose-Capillary 246 (H) 65 - 99 mg/dL  Glucose, capillary     Status: None   Collection Time: 04/07/17  5:15 PM  Result Value Ref Range   Glucose-Capillary 94 65 - 99 mg/dL  Glucose, capillary     Status: Abnormal   Collection Time: 04/07/17  8:55 PM  Result Value Ref Range   Glucose-Capillary 115 (H) 65 - 99 mg/dL   Comment 1 Notify RN   Glucose, capillary     Status: None   Collection Time: 04/08/17  6:09 AM  Result Value Ref Range   Glucose-Capillary 81 65 - 99 mg/dL   Comment 1 Notify RN      Lipid Panel     Component Value Date/Time   CHOL 253 (H) 04/05/2017 1149   TRIG 115 04/05/2017 1149   HDL 51 04/05/2017 1149  CHOLHDL 5.0 04/05/2017 1149   VLDL 23 04/05/2017 1149   LDLCALC 179 (H) 04/05/2017 1149   LDLDIRECT 206.0 07/27/2014 0745     Lab Results  Component Value Date   HGBA1C 10.8 (H) 04/05/2017   HGBA1C 10.5 (H) 04/03/2017   HGBA1C 12.3 (H) 01/22/2017     HPI :  56 y.o.femalewith medical history significant for some dependent diabetes with frequent DKA, nonischemic  cardiomyopathy with EF 35%, recent H pylori, hypothyroidism. GERD, dylipidemia and anxiety disorderpresents to emergency Department chief complaint persistent nausea vomiting with intermittent chest pain. She had some hematemesis , previous admission.  HOSPITAL COURSE:    #1. Chest pain/elevated troponin. Suspect esophageal etiology.  Cardiac enzymes negative 3, EKG with sinus rhythm borderline right axis deviation, chest x-ray negative.Patient hospitalized last month with same, underwent cardiac cath revealing normal coronary arteries and low normal LV function with mild anterior and apical hypokinesia. Echo with Normal LV function with focal wall motion abnormalities. She was also evaluated by cardiology who thought that etiology for chest pain and mild troponin leak was undetermined Telemetry uneventful Symptoms more consistent with dyspepsia, barium esophagogram was still pending at the time of discharge and can be done as outpatient  and gastric emptying study was negative     #2. Nausea, persistent. Gastroparesis vs dyspepsia Started on PPI Gastric emptying study, barium esophagogram will be performed given recent hematemesis during last admission No evidence of DKA  #3. Hypertension. Fair control in the emergency department. Home medications include Toprol -continue with parameters  4. Diabetes type 2. Uncontrolled. History of noncompliance. Hemoglobin A1c 2 days ago 10.5. Anion gap 16>10. Bicarb 24. CBG now stable, dose of Basaglar and SSI reduced based on Accu-Cheks in the hospital Metformin has been discontinued  #5. GERD. Appears stable at baseline. -continue home med  6. Anxiety disorder, generalized. -Continue home meds  #7. Chronic kidney disease stage 2. Creatinine 0.58 prior to discharge  Hydrated with IV fluids  Hypokalemia repleted  #8. Nonischemic cardiomyopathy. See #1. No indications fluid overload.  -continue home meds     Discharge Exam:    Blood pressure 123/74, pulse 68, temperature 97.7 F (36.5 C), temperature source Oral, resp. rate 18, height 5' (1.524 m), weight 54.2 kg (119 lb 7.8 oz), last menstrual period 08/11/2012, SpO2 100 %.  Cardiovascular system: S1 & S2 heard, RRR. No JVD, murmurs, rubs, gallops or clicks. No pedal edema. Gastrointestinal system: Abdomen is nondistended, soft and nontender. No organomegaly or masses felt. Normal bowel sounds heard. Central nervous system: Alert and oriented. No focal neurological deficits. Extremities: Symmetric 5 x 5 power. Skin: No rashes, lesions or ulcers Psychiatry: Judgement and insight appear normal. Mood & affect appropriate.      Follow-up Information    Eliezer Lofts, MD. Call.   Specialty:  Family Medicine Why:  Hospital follow-up in 3-5 days Contact information: [place] [address] [phone]           Signed: Reyne Dumas 04/08/2017, 10:09 AM        Time spent >45 mins

## 2017-04-10 ENCOUNTER — Telehealth: Payer: Self-pay

## 2017-04-10 ENCOUNTER — Ambulatory Visit: Payer: 59 | Admitting: Nutrition

## 2017-04-10 NOTE — Telephone Encounter (Signed)
LM requesting call back to complete TCM and schedule hosp f/u.  

## 2017-04-23 ENCOUNTER — Encounter: Payer: 59 | Admitting: Family Medicine

## 2017-04-23 DIAGNOSIS — Z0289 Encounter for other administrative examinations: Secondary | ICD-10-CM

## 2017-05-02 ENCOUNTER — Other Ambulatory Visit: Payer: Self-pay | Admitting: Family Medicine

## 2017-05-02 ENCOUNTER — Ambulatory Visit: Payer: 59 | Admitting: Dietician

## 2017-05-02 NOTE — Telephone Encounter (Signed)
Last office visit 02/15/2017.  Ok to refill?

## 2017-05-09 ENCOUNTER — Other Ambulatory Visit: Payer: Self-pay | Admitting: Internal Medicine

## 2017-05-20 ENCOUNTER — Other Ambulatory Visit: Payer: Self-pay | Admitting: Internal Medicine

## 2017-05-31 ENCOUNTER — Other Ambulatory Visit: Payer: Self-pay | Admitting: *Deleted

## 2017-05-31 MED ORDER — SERTRALINE HCL 50 MG PO TABS: 50.0000 mg | ORAL_TABLET | Freq: Every day | ORAL | 3 refills | Status: DC

## 2017-06-03 ENCOUNTER — Emergency Department (HOSPITAL_COMMUNITY): Payer: 59

## 2017-06-03 ENCOUNTER — Encounter (HOSPITAL_COMMUNITY): Payer: Self-pay | Admitting: Emergency Medicine

## 2017-06-03 ENCOUNTER — Inpatient Hospital Stay (HOSPITAL_COMMUNITY)
Admission: EM | Admit: 2017-06-03 | Discharge: 2017-06-05 | DRG: 074 | Disposition: A | Discharge status: Home / Self Care | Payer: 59 | Attending: Internal Medicine | Admitting: Internal Medicine

## 2017-06-03 DIAGNOSIS — I5022 Chronic systolic (congestive) heart failure: Secondary | ICD-10-CM | POA: Diagnosis present

## 2017-06-03 DIAGNOSIS — E039 Hypothyroidism, unspecified: Secondary | ICD-10-CM | POA: Diagnosis present

## 2017-06-03 DIAGNOSIS — K3184 Gastroparesis: Secondary | ICD-10-CM | POA: Diagnosis present

## 2017-06-03 DIAGNOSIS — Z833 Family history of diabetes mellitus: Secondary | ICD-10-CM | POA: Diagnosis not present

## 2017-06-03 DIAGNOSIS — E538 Deficiency of other specified B group vitamins: Secondary | ICD-10-CM | POA: Diagnosis present

## 2017-06-03 DIAGNOSIS — E118 Type 2 diabetes mellitus with unspecified complications: Secondary | ICD-10-CM | POA: Diagnosis not present

## 2017-06-03 DIAGNOSIS — R1110 Vomiting, unspecified: Secondary | ICD-10-CM

## 2017-06-03 DIAGNOSIS — G629 Polyneuropathy, unspecified: Secondary | ICD-10-CM | POA: Diagnosis present

## 2017-06-03 DIAGNOSIS — I10 Essential (primary) hypertension: Secondary | ICD-10-CM | POA: Diagnosis not present

## 2017-06-03 DIAGNOSIS — K580 Irritable bowel syndrome with diarrhea: Secondary | ICD-10-CM | POA: Diagnosis present

## 2017-06-03 DIAGNOSIS — Z7982 Long term (current) use of aspirin: Secondary | ICD-10-CM | POA: Diagnosis not present

## 2017-06-03 DIAGNOSIS — E1159 Type 2 diabetes mellitus with other circulatory complications: Secondary | ICD-10-CM | POA: Diagnosis not present

## 2017-06-03 DIAGNOSIS — E1165 Type 2 diabetes mellitus with hyperglycemia: Secondary | ICD-10-CM | POA: Diagnosis not present

## 2017-06-03 DIAGNOSIS — Z87891 Personal history of nicotine dependence: Secondary | ICD-10-CM

## 2017-06-03 DIAGNOSIS — K529 Noninfective gastroenteritis and colitis, unspecified: Secondary | ICD-10-CM | POA: Insufficient documentation

## 2017-06-03 DIAGNOSIS — Q441 Other congenital malformations of gallbladder: Secondary | ICD-10-CM | POA: Diagnosis not present

## 2017-06-03 DIAGNOSIS — E785 Hyperlipidemia, unspecified: Secondary | ICD-10-CM | POA: Diagnosis present

## 2017-06-03 DIAGNOSIS — E1149 Type 2 diabetes mellitus with other diabetic neurological complication: Secondary | ICD-10-CM

## 2017-06-03 DIAGNOSIS — E1110 Type 2 diabetes mellitus with ketoacidosis without coma: Secondary | ICD-10-CM | POA: Diagnosis present

## 2017-06-03 DIAGNOSIS — R1030 Lower abdominal pain, unspecified: Secondary | ICD-10-CM

## 2017-06-03 DIAGNOSIS — Z794 Long term (current) use of insulin: Secondary | ICD-10-CM

## 2017-06-03 DIAGNOSIS — K581 Irritable bowel syndrome with constipation: Secondary | ICD-10-CM

## 2017-06-03 DIAGNOSIS — R110 Nausea: Secondary | ICD-10-CM | POA: Diagnosis present

## 2017-06-03 DIAGNOSIS — E860 Dehydration: Secondary | ICD-10-CM | POA: Diagnosis present

## 2017-06-03 DIAGNOSIS — K219 Gastro-esophageal reflux disease without esophagitis: Secondary | ICD-10-CM

## 2017-06-03 DIAGNOSIS — E1143 Type 2 diabetes mellitus with diabetic autonomic (poly)neuropathy: Principal | ICD-10-CM | POA: Diagnosis present

## 2017-06-03 DIAGNOSIS — E872 Acidosis, unspecified: Secondary | ICD-10-CM | POA: Insufficient documentation

## 2017-06-03 DIAGNOSIS — E1122 Type 2 diabetes mellitus with diabetic chronic kidney disease: Secondary | ICD-10-CM | POA: Diagnosis present

## 2017-06-03 DIAGNOSIS — R109 Unspecified abdominal pain: Secondary | ICD-10-CM | POA: Insufficient documentation

## 2017-06-03 DIAGNOSIS — D72829 Elevated white blood cell count, unspecified: Secondary | ICD-10-CM

## 2017-06-03 DIAGNOSIS — N182 Chronic kidney disease, stage 2 (mild): Secondary | ICD-10-CM | POA: Diagnosis present

## 2017-06-03 DIAGNOSIS — R197 Diarrhea, unspecified: Secondary | ICD-10-CM

## 2017-06-03 DIAGNOSIS — A419 Sepsis, unspecified organism: Secondary | ICD-10-CM | POA: Diagnosis not present

## 2017-06-03 DIAGNOSIS — I429 Cardiomyopathy, unspecified: Secondary | ICD-10-CM | POA: Diagnosis present

## 2017-06-03 DIAGNOSIS — Z79899 Other long term (current) drug therapy: Secondary | ICD-10-CM

## 2017-06-03 DIAGNOSIS — K769 Liver disease, unspecified: Secondary | ICD-10-CM | POA: Diagnosis present

## 2017-06-03 DIAGNOSIS — I428 Other cardiomyopathies: Secondary | ICD-10-CM

## 2017-06-03 DIAGNOSIS — F1210 Cannabis abuse, uncomplicated: Secondary | ICD-10-CM | POA: Diagnosis present

## 2017-06-03 DIAGNOSIS — F1910 Other psychoactive substance abuse, uncomplicated: Secondary | ICD-10-CM | POA: Diagnosis not present

## 2017-06-03 DIAGNOSIS — M810 Age-related osteoporosis without current pathological fracture: Secondary | ICD-10-CM | POA: Diagnosis present

## 2017-06-03 DIAGNOSIS — F411 Generalized anxiety disorder: Secondary | ICD-10-CM | POA: Diagnosis present

## 2017-06-03 DIAGNOSIS — Z8249 Family history of ischemic heart disease and other diseases of the circulatory system: Secondary | ICD-10-CM

## 2017-06-03 DIAGNOSIS — E784 Other hyperlipidemia: Secondary | ICD-10-CM | POA: Diagnosis not present

## 2017-06-03 DIAGNOSIS — I130 Hypertensive heart and chronic kidney disease with heart failure and stage 1 through stage 4 chronic kidney disease, or unspecified chronic kidney disease: Secondary | ICD-10-CM | POA: Diagnosis present

## 2017-06-03 DIAGNOSIS — Z885 Allergy status to narcotic agent status: Secondary | ICD-10-CM

## 2017-06-03 DIAGNOSIS — R112 Nausea with vomiting, unspecified: Secondary | ICD-10-CM

## 2017-06-03 DIAGNOSIS — E038 Other specified hypothyroidism: Secondary | ICD-10-CM | POA: Diagnosis not present

## 2017-06-03 DIAGNOSIS — R739 Hyperglycemia, unspecified: Secondary | ICD-10-CM

## 2017-06-03 DIAGNOSIS — K589 Irritable bowel syndrome without diarrhea: Secondary | ICD-10-CM | POA: Diagnosis present

## 2017-06-03 DIAGNOSIS — E1142 Type 2 diabetes mellitus with diabetic polyneuropathy: Secondary | ICD-10-CM

## 2017-06-03 LAB — BASIC METABOLIC PANEL
Anion gap: 9 (ref 5–15)
BUN: 8 mg/dL (ref 6–20)
CALCIUM: 8.4 mg/dL — AB (ref 8.9–10.3)
CO2: 20 mmol/L — ABNORMAL LOW (ref 22–32)
Chloride: 106 mmol/L (ref 101–111)
Creatinine, Ser: 0.62 mg/dL (ref 0.44–1.00)
GFR calc Af Amer: >60 mL/min (ref >60)
Glucose, Bld: 289 mg/dL — ABNORMAL HIGH (ref 65–99)
POTASSIUM: 3.5 mmol/L (ref 3.5–5.1)
SODIUM: 135 mmol/L (ref 135–145)

## 2017-06-03 LAB — URINALYSIS, ROUTINE W REFLEX MICROSCOPIC
BILIRUBIN URINE: NEGATIVE
Bacteria, UA: NONE SEEN
Hgb urine dipstick: NEGATIVE
KETONES UR: 20 mg/dL — AB
LEUKOCYTES UA: NEGATIVE
NITRITE: NEGATIVE
PH: 5 (ref 5.0–8.0)
Protein, ur: NEGATIVE mg/dL
Specific Gravity, Urine: 1.03 (ref 1.005–1.030)

## 2017-06-03 LAB — I-STAT CHEM 8, ED
BUN: 15 mg/dL (ref 6–20)
Calcium, Ion: 1.05 mmol/L — ABNORMAL LOW (ref 1.15–1.40)
Chloride: 102 mmol/L (ref 101–111)
Creatinine, Ser: 0.5 mg/dL (ref 0.44–1.00)
Glucose, Bld: 486 mg/dL — ABNORMAL HIGH (ref 65–99)
HCT: 49 % — ABNORMAL HIGH (ref 36.0–46.0)
Hemoglobin: 16.7 g/dL — ABNORMAL HIGH (ref 12.0–15.0)
Potassium: 3.6 mmol/L (ref 3.5–5.1)
Sodium: 137 mmol/L (ref 135–145)
TCO2: 19 mmol/L (ref 0–100)

## 2017-06-03 LAB — CBC WITH DIFFERENTIAL/PLATELET
Basophils Absolute: 0 10*3/uL (ref 0.0–0.1)
Basophils Relative: 0 %
Eosinophils Absolute: 0 10*3/uL (ref 0.0–0.7)
Eosinophils Relative: 0 %
HCT: 45.9 % (ref 36.0–46.0)
Hemoglobin: 16.5 g/dL — ABNORMAL HIGH (ref 12.0–15.0)
Lymphocytes Relative: 9 %
Lymphs Abs: 1.9 10*3/uL (ref 0.7–4.0)
MCH: 30.3 pg (ref 26.0–34.0)
MCHC: 35.9 g/dL (ref 30.0–36.0)
MCV: 84.2 fL (ref 78.0–100.0)
Monocytes Absolute: 0.7 10*3/uL (ref 0.1–1.0)
Monocytes Relative: 3 %
Neutro Abs: 17.6 10*3/uL — ABNORMAL HIGH (ref 1.7–7.7)
Neutrophils Relative %: 88 %
Platelets: 367 10*3/uL (ref 150–400)
RBC: 5.45 MIL/uL — ABNORMAL HIGH (ref 3.87–5.11)
RDW: 14.3 % (ref 11.5–15.5)
WBC: 20.2 10*3/uL — ABNORMAL HIGH (ref 4.0–10.5)

## 2017-06-03 LAB — CBG MONITORING, ED
GLUCOSE-CAPILLARY: 375 mg/dL — AB (ref 65–99)
Glucose-Capillary: 453 mg/dL — ABNORMAL HIGH (ref 65–99)
Glucose-Capillary: 286 mg/dL — ABNORMAL HIGH (ref 65–99)

## 2017-06-03 LAB — COMPREHENSIVE METABOLIC PANEL
ALT: 16 U/L (ref 14–54)
AST: 23 U/L (ref 15–41)
Albumin: 4.8 g/dL (ref 3.5–5.0)
Alkaline Phosphatase: 79 U/L (ref 38–126)
Anion gap: 18 — ABNORMAL HIGH (ref 5–15)
BUN: 14 mg/dL (ref 6–20)
CO2: 18 mmol/L — ABNORMAL LOW (ref 22–32)
Calcium: 9.8 mg/dL (ref 8.9–10.3)
Chloride: 101 mmol/L (ref 101–111)
Creatinine, Ser: 0.87 mg/dL (ref 0.44–1.00)
GFR calc Af Amer: >60 mL/min (ref >60)
GFR calc non Af Amer: >60 mL/min (ref >60)
Glucose, Bld: 468 mg/dL — ABNORMAL HIGH (ref 65–99)
Potassium: 3.6 mmol/L (ref 3.5–5.1)
Sodium: 137 mmol/L (ref 135–145)
Total Bilirubin: 0.9 mg/dL (ref 0.3–1.2)
Total Protein: 8.1 g/dL (ref 6.5–8.1)

## 2017-06-03 LAB — I-STAT VENOUS BLOOD GAS, ED
ACID-BASE DEFICIT: 2 mmol/L (ref 0.0–2.0)
Bicarbonate: 19.3 mmol/L — ABNORMAL LOW (ref 20.0–28.0)
O2 SAT: 98 %
PO2 VEN: 87 mmHg — AB (ref 32.0–45.0)
TCO2: 20 mmol/L (ref 0–100)
pCO2, Ven: 25.6 mmHg — ABNORMAL LOW (ref 44.0–60.0)
pH, Ven: 7.485 — ABNORMAL HIGH (ref 7.250–7.430)

## 2017-06-03 LAB — MAGNESIUM: Magnesium: 1.6 mg/dL — ABNORMAL LOW (ref 1.7–2.4)

## 2017-06-03 LAB — RAPID URINE DRUG SCREEN, HOSP PERFORMED
AMPHETAMINES: NOT DETECTED
BARBITURATES: NOT DETECTED
Benzodiazepines: NOT DETECTED
Cocaine: NOT DETECTED
Opiates: NOT DETECTED
TETRAHYDROCANNABINOL: POSITIVE — AB

## 2017-06-03 LAB — I-STAT TROPONIN, ED: Troponin i, poc: 0.01 ng/mL (ref 0.00–0.08)

## 2017-06-03 LAB — I-STAT CG4 LACTIC ACID, ED
Lactic Acid, Venous: 4.87 mmol/L (ref 0.5–1.9)
Lactic Acid, Venous: 3.15 mmol/L (ref 0.5–1.9)

## 2017-06-03 LAB — LIPASE, BLOOD: LIPASE: 26 U/L (ref 11–51)

## 2017-06-03 LAB — LACTIC ACID, PLASMA: LACTIC ACID, VENOUS: 1.2 mmol/L (ref 0.5–1.9)

## 2017-06-03 MED ORDER — EZETIMIBE 10 MG PO TABS
10.0000 mg | ORAL_TABLET | Freq: Every day | ORAL | Status: DC
  Administered 2017-06-03 – 2017-06-05 (×3): 10 mg via ORAL
  Filled 2017-06-03 (×3): qty 1

## 2017-06-03 MED ORDER — ENOXAPARIN SODIUM 40 MG/0.4ML ~~LOC~~ SOLN
40.0000 mg | SUBCUTANEOUS | Status: DC
  Administered 2017-06-03 – 2017-06-04 (×2): 40 mg via SUBCUTANEOUS
  Filled 2017-06-03 (×2): qty 0.4

## 2017-06-03 MED ORDER — METOCLOPRAMIDE HCL 5 MG/ML IJ SOLN
10.0000 mg | Freq: Once | INTRAMUSCULAR | Status: AC
  Administered 2017-06-03: 10 mg via INTRAVENOUS
  Filled 2017-06-03: qty 2

## 2017-06-03 MED ORDER — ASPIRIN 81 MG PO CHEW
81.0000 mg | CHEWABLE_TABLET | Freq: Every day | ORAL | Status: DC
  Administered 2017-06-03 – 2017-06-05 (×3): 81 mg via ORAL
  Filled 2017-06-03 (×3): qty 1

## 2017-06-03 MED ORDER — IOPAMIDOL (ISOVUE-300) INJECTION 61%
INTRAVENOUS | Status: AC
  Administered 2017-06-03: 100 mL
  Filled 2017-06-03: qty 100

## 2017-06-03 MED ORDER — METRONIDAZOLE IN NACL 5-0.79 MG/ML-% IV SOLN
500.0000 mg | Freq: Once | INTRAVENOUS | Status: AC
  Administered 2017-06-03: 500 mg via INTRAVENOUS
  Filled 2017-06-03: qty 100

## 2017-06-03 MED ORDER — INSULIN ASPART 100 UNIT/ML ~~LOC~~ SOLN
0.0000 [IU] | Freq: Three times a day (TID) | SUBCUTANEOUS | Status: DC
  Administered 2017-06-04: 3 [IU] via SUBCUTANEOUS
  Administered 2017-06-04: 7 [IU] via SUBCUTANEOUS
  Administered 2017-06-04: 1 [IU] via SUBCUTANEOUS
  Administered 2017-06-04: 2 [IU] via SUBCUTANEOUS
  Administered 2017-06-05: 3 [IU] via SUBCUTANEOUS
  Administered 2017-06-05: 2 [IU] via SUBCUTANEOUS
  Filled 2017-06-03: qty 1

## 2017-06-03 MED ORDER — SERTRALINE HCL 50 MG PO TABS
50.0000 mg | ORAL_TABLET | Freq: Every day | ORAL | Status: DC
  Administered 2017-06-03 – 2017-06-05 (×3): 50 mg via ORAL
  Filled 2017-06-03 (×3): qty 1

## 2017-06-03 MED ORDER — LOSARTAN POTASSIUM 50 MG PO TABS
25.0000 mg | ORAL_TABLET | Freq: Every day | ORAL | Status: DC
  Administered 2017-06-03 – 2017-06-05 (×3): 25 mg via ORAL
  Filled 2017-06-03 (×3): qty 1

## 2017-06-03 MED ORDER — ONDANSETRON HCL 4 MG/2ML IJ SOLN
4.0000 mg | Freq: Once | INTRAMUSCULAR | Status: AC
  Administered 2017-06-03: 4 mg via INTRAVENOUS
  Filled 2017-06-03: qty 2

## 2017-06-03 MED ORDER — ACETAMINOPHEN 650 MG RE SUPP: 650.0000 mg | Freq: Four times a day (QID) | RECTAL | Status: DC | PRN

## 2017-06-03 MED ORDER — SODIUM CHLORIDE 0.9 % IV SOLN
INTRAVENOUS | Status: DC
  Administered 2017-06-03 – 2017-06-05 (×3): via INTRAVENOUS

## 2017-06-03 MED ORDER — INSULIN ASPART 100 UNIT/ML ~~LOC~~ SOLN
8.0000 [IU] | Freq: Once | SUBCUTANEOUS | Status: DC
  Filled 2017-06-03: qty 1

## 2017-06-03 MED ORDER — PIPERACILLIN-TAZOBACTAM 3.375 G IVPB
3.3750 g | Freq: Three times a day (TID) | INTRAVENOUS | Status: DC
  Administered 2017-06-03 – 2017-06-04 (×3): 3.375 g via INTRAVENOUS
  Filled 2017-06-03 (×4): qty 50

## 2017-06-03 MED ORDER — ACETAMINOPHEN 325 MG PO TABS
650.0000 mg | ORAL_TABLET | Freq: Four times a day (QID) | ORAL | Status: DC | PRN
  Administered 2017-06-03 – 2017-06-05 (×3): 650 mg via ORAL
  Filled 2017-06-03 (×3): qty 2

## 2017-06-03 MED ORDER — PIPERACILLIN-TAZOBACTAM 3.375 G IVPB 30 MIN
3.3750 g | Freq: Once | INTRAVENOUS | Status: AC
  Administered 2017-06-03: 3.375 g via INTRAVENOUS
  Filled 2017-06-03: qty 50

## 2017-06-03 MED ORDER — VANCOMYCIN HCL IN DEXTROSE 750-5 MG/150ML-% IV SOLN
750.0000 mg | Freq: Two times a day (BID) | INTRAVENOUS | Status: DC
  Filled 2017-06-03: qty 150

## 2017-06-03 MED ORDER — INSULIN ASPART 100 UNIT/ML ~~LOC~~ SOLN
8.0000 [IU] | Freq: Once | SUBCUTANEOUS | Status: AC
  Administered 2017-06-03: 8 [IU] via INTRAVENOUS

## 2017-06-03 MED ORDER — MAGNESIUM SULFATE 2 GM/50ML IV SOLN
2.0000 g | Freq: Once | INTRAVENOUS | Status: AC
  Administered 2017-06-03: 2 g via INTRAVENOUS
  Filled 2017-06-03: qty 50

## 2017-06-03 MED ORDER — METOPROLOL SUCCINATE ER 25 MG PO TB24
25.0000 mg | ORAL_TABLET | Freq: Every day | ORAL | Status: DC
  Administered 2017-06-03 – 2017-06-05 (×3): 25 mg via ORAL
  Filled 2017-06-03 (×3): qty 1

## 2017-06-03 MED ORDER — LEVOTHYROXINE SODIUM 75 MCG PO TABS
150.0000 ug | ORAL_TABLET | Freq: Every day | ORAL | Status: DC
  Administered 2017-06-04 – 2017-06-05 (×2): 150 ug via ORAL
  Filled 2017-06-03: qty 1
  Filled 2017-06-03 (×2): qty 2

## 2017-06-03 MED ORDER — ONDANSETRON HCL 4 MG/2ML IJ SOLN
4.0000 mg | Freq: Four times a day (QID) | INTRAMUSCULAR | Status: DC | PRN
  Administered 2017-06-04: 4 mg via INTRAVENOUS
  Filled 2017-06-03: qty 2

## 2017-06-03 MED ORDER — SODIUM CHLORIDE 0.9 % IV BOLUS (SEPSIS)
1000.0000 mL | Freq: Once | INTRAVENOUS | Status: AC
  Administered 2017-06-03: 1000 mL via INTRAVENOUS

## 2017-06-03 MED ORDER — ONDANSETRON HCL 4 MG PO TABS
4.0000 mg | ORAL_TABLET | Freq: Four times a day (QID) | ORAL | Status: DC | PRN
  Administered 2017-06-04: 4 mg via ORAL
  Filled 2017-06-03: qty 1

## 2017-06-03 MED ORDER — VANCOMYCIN HCL IN DEXTROSE 1-5 GM/200ML-% IV SOLN
1000.0000 mg | Freq: Once | INTRAVENOUS | Status: AC
  Administered 2017-06-03: 1000 mg via INTRAVENOUS
  Filled 2017-06-03: qty 200

## 2017-06-03 MED ORDER — PANTOPRAZOLE SODIUM 40 MG PO TBEC
40.0000 mg | DELAYED_RELEASE_TABLET | Freq: Every day | ORAL | Status: DC
  Administered 2017-06-03 – 2017-06-05 (×3): 40 mg via ORAL
  Filled 2017-06-03 (×3): qty 1

## 2017-06-03 MED ORDER — ATORVASTATIN CALCIUM 80 MG PO TABS
80.0000 mg | ORAL_TABLET | Freq: Every day | ORAL | Status: DC
  Administered 2017-06-03 – 2017-06-04 (×2): 80 mg via ORAL
  Filled 2017-06-03 (×2): qty 1

## 2017-06-03 NOTE — H&P (Signed)
History and Physical    [name] [name] OVF:[id number] DOB: Oct 11, 1961 DOA: 06/03/2017   PCP: Jinny Sanders, MD   Patient coming from:  Home    Chief Complaint: Abdominal pain, nausea, vomiting  and diarrhea   HPI: [name] [name] is a 56 y.o. female with medical history of anxiety, B12 deficiency, Gerd, hyperlipidemia, hypertension, hypothyroidism, IBS,  diabetes, intermittent vertigo, chronic marijuana use, presenting with nausea, vomiting and diarrhea. The patient reports that the symptoms began about 4 this morning. She has vomited 10 times prior to admission, he had 10 episodes of diarrhea prior to presentation. She denies any black or bloody stools. She denies hematemesis. She complains of diffuse lower abdominal pain, severe with abdominal guarding. She also reports reflux symptoms but not cardiac complaints.  She reports cough and chills without fever, with no shortness of breath. She denies any sick contacts, recent antibiotics, travel, or food poisoning. She denies any leg pain or swelling. Of note, she has used  Marijuana  this morning, 2 times and has done so daily  She did not take her morning medications today, including er insulin. She has polyuria but not dysuria or hematuria   ED Course:  BP 100/65   Pulse 96   Temp 97.5 F (36.4 C) (Oral)   Resp 17   Ht 5\' 1"  (1.549 m)   Wt 58.5 kg (129 lb)   LMP 08/11/2012   SpO2 98%   BMI 24.37 kg/m    WBC 20 Hb 16.5 Bicarb 18 GLu 468 Anion Gap 18 UA + Ketones THC + Lipase 26 LA  Was 4.87-> 3.81 TN 0.01Received Vanc and ZOsyn  REceived 3 L fluid  Insulin 8 Units  C diff pending  Nausea improved with Reglan   EKG without changes from prior,   Review of Systems:  As per HPI otherwise all other systems reviewed and are negative  Past Medical History:  Diagnosis Date  . Anxiety   . B12 deficiency   . Chest pain 04/05/2017  . GERD (gastroesophageal reflux disease)   . Hyperlipidemia   . Hypertension    [name]  .  Hypothyroidism   . IBS (irritable bowel syndrome)   . Intermittent vertigo   . 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 Leg cramping    "@ night" (09/18/2013)  . Migraine    "once q couple months" (09/18/2013)  . Osteoporosis   . Peripheral neuropathy   . Type II diabetes mellitus ([place])   . Umbilical hernia    unrepaired (09/18/2013)    Past Surgical History:  Procedure Laterality Date  . CESAREAN SECTION  1989  . LAPAROSCOPIC ASSISTED VAGINAL HYSTERECTOMY  09/23/2012   Procedure: LAPAROSCOPIC ASSISTED VAGINAL HYSTERECTOMY;  Surgeon: Linda Hedges, DO;  Location: [facility];  Service: Gynecology;  Laterality: N/A;  pull Dr Gregor Hams instrument  . LEFT HEART CATH AND CORONARY ANGIOGRAPHY N/A 02/11/2017   Procedure: Left Heart Cath and Coronary Angiography;  Surgeon: Lorretta Harp, MD;  Location: [facility];  Service: Cardiovascular;  Laterality: N/A;  . TUBAL LIGATION Bilateral 1992  . VAGINAL HYSTERECTOMY  2013    Social History Social History   Social History  . Marital status: Married    Spouse name: N/A  . Number of children: 2  . Years of education: N/A   Occupational History  . UNEMPLOYED Unemployed    homemaker   Social History Main Topics  . Smoking status: Former Smoker  Years: 0.20    Types: Cigarettes    Quit date: 12/10/1990  . Smokeless tobacco: Never Used  . Alcohol use No  . Drug use: Yes    Frequency: 3.0 times per week    Types: Marijuana  . Sexual activity: Yes   Other Topics Concern  . Not on file   Social History Narrative   Regular exercise- no    Diet- lots of mountain dew, limits fast foods      Allergies  Allergen Reactions  . Codeine Hives, Anxiety and Other (See Comments)    Family History  Problem Relation Age of Onset  . Diabetes Other   . Heart disease Other   . Heart failure Mother   . Diabetes Maternal Grandmother   . Alzheimer's disease Maternal Grandmother   . Coronary artery disease  Maternal Grandmother   . Heart attack Maternal Grandfather   . Heart failure Brother       Prior to Admission medications   Medication Sig Start Date End Date Taking? Authorizing Provider  aspirin 81 MG chewable tablet Chew 1 tablet (81 mg total) by mouth daily. 02/12/17  Yes Jani Gravel, MD  atorvastatin (LIPITOR) 80 MG tablet Take 1 tablet (80 mg total) by mouth daily at 6 PM. 09/04/16  Yes Bedsole, Amy E, MD  cetirizine (ZYRTEC) 10 MG tablet Take 1 tablet (10 mg total) by mouth daily. MUST SCHEDULE ANNUAL EXAM 05/20/17  Yes Baity, Coralie Keens, NP  Continuous Blood Gluc Receiver (FREESTYLE LIBRE READER) DEVI 1 Device by Does not apply route 3 (three) times daily. 04/03/17  Yes Philemon Kingdom, MD  Continuous Blood Gluc Sensor (FREESTYLE LIBRE SENSOR SYSTEM) MISC 1 Device by Does not apply route every 30 (thirty) days. 04/03/17  Yes Philemon Kingdom, MD  ezetimibe (ZETIA) 10 MG tablet Take 1 tablet (10 mg total) by mouth daily. 09/11/16  Yes Bedsole, Amy E, MD  Insulin Glargine (BASAGLAR KWIKPEN) 100 UNIT/ML SOPN Inject 0.45 mLs (45 Units total) into the skin daily at 10 pm. INJECT 55 UNITS INTO THE SKIN DAILY AT 10 PM. 04/08/17  Yes Reyne Dumas, MD  insulin lispro (HUMALOG KWIKPEN) 100 UNIT/ML KiwkPen Inject 0.08 mLs (8 Units total) into the skin 3 (three) times daily. Patient taking differently: Inject 12 Units into the skin 3 (three) times daily.  04/08/17  Yes Reyne Dumas, MD  levothyroxine (SYNTHROID, LEVOTHROID) 150 MCG tablet Take 1 tablet (150 mcg total) by mouth daily. 04/03/17  Yes Philemon Kingdom, MD  losartan (COZAAR) 25 MG tablet Take 25 mg by mouth daily. 05/27/17  Yes [provider]  metoprolol succinate (TOPROL-XL) 25 MG 24 hr tablet Take 25 mg by mouth daily. 03/24/17  Yes [provider]  omeprazole (PRILOSEC) 40 MG capsule Take 1 capsule (40 mg total) by mouth 2 (two) times daily. 03/19/17  Yes Bedsole, Amy E, MD  ondansetron (ZOFRAN) 4 MG tablet TAKE 1 TABLET (4  MG TOTAL) BY MOUTH EVERY 6 (SIX) HOURS. 05/03/17  Yes Bedsole, Amy E, MD  pantoprazole (PROTONIX) 40 MG tablet Take 1 tablet (40 mg total) by mouth daily. 04/08/17  Yes Reyne Dumas, MD  RELPAX 40 MG tablet ONE TABLET BY MOUTH AT ONSET OF HEADACHE. MAY REPEAT IN 2 HOURS IF HEADACHE PERSISTS OR RECURS. 10/17/16  Yes Bedsole, Amy E, MD  sertraline (ZOLOFT) 50 MG tablet Take 1 tablet (50 mg total) by mouth daily. 05/31/17  Yes Jinny Sanders, MD    Physical Exam:  Vitals:   06/03/17 1600  06/03/17 1630 06/03/17 1700 06/03/17 1715  BP: 105/63 108/66 101/68 100/65  Pulse: (!) 105 (!) 101 (!) 101 96  Resp: (!) 21 18 (!) 24 17  Temp:      TempSrc:      SpO2: 94% 96% 95% 98%  Weight:      Height:       Constitutional: ill appearing  Eyes: PERRL, lids and conjunctivae normal ENMT: Mucous membranes are moist, without exudate or lesions . Many missing teeth  Neck: normal, supple, no masses, no thyromegaly Respiratory: clear to auscultation bilaterally, no wheezing, no crackles. Normal respiratory effort  Cardiovascular: Regular rate and rhythm, no murmurs, rubs or gallops. No extremity edema. 2+ pedal pulses. No carotid bruits.  Abdomen: Soft, tender to palpation diffusely , No hepatosplenomegaly. Bowel sounds positive.  Musculoskeletal: no clubbing / cyanosis. Moves all extremities Skin: no jaundice, No lesions.  Neurologic: Sensation intact  Strength equal in all extremities Psychiatric:   Alert and oriented x 3. Anxious    Labs on Admission: I have personally reviewed following labs and imaging studies  CBC:  Recent Labs Lab 06/03/17 1135 06/03/17 1156  WBC 20.2*  --   NEUTROABS 17.6*  --   HGB 16.5* 16.7*  HCT 45.9 49.0*  MCV 84.2  --   PLT 367  --     Basic Metabolic Panel:  Recent Labs Lab 06/03/17 1135 06/03/17 1156  NA 137 137  K 3.6 3.6  CL 101 102  CO2 18*  --   GLUCOSE 468* 486*  BUN 14 15  CREATININE 0.87 0.50  CALCIUM 9.8  --   MG 1.6*  --      GFR: Estimated Creatinine Clearance: 66.9 mL/min (by C-G formula based on SCr of 0.5 mg/dL).  Liver Function Tests:  Recent Labs Lab 06/03/17 1135  AST 23  ALT 16  ALKPHOS 79  BILITOT 0.9  PROT 8.1  ALBUMIN 4.8    Recent Labs Lab 06/03/17 1135  LIPASE 26   No results for input(s): AMMONIA in the last 168 hours.  Coagulation Profile: No results for input(s): INR, PROTIME in the last 168 hours.  Cardiac Enzymes: No results for input(s): CKTOTAL, CKMB, CKMBINDEX, TROPONINI in the last 168 hours.  BNP (last 3 results) No results for input(s): PROBNP in the last 8760 hours.  HbA1C: No results for input(s): HGBA1C in the last 72 hours.  CBG:  Recent Labs Lab 06/03/17 1115 06/03/17 1520  GLUCAP 453* 375*    Lipid Profile: No results for input(s): CHOL, HDL, LDLCALC, TRIG, CHOLHDL, LDLDIRECT in the last 72 hours.  Thyroid Function Tests: No results for input(s): TSH, T4TOTAL, FREET4, T3FREE, THYROIDAB in the last 72 hours.  Anemia Panel: No results for input(s): VITAMINB12, FOLATE, FERRITIN, TIBC, IRON, RETICCTPCT in the last 72 hours.  Urine analysis:    Component Value Date/Time   COLORURINE COLORLESS (A) 06/03/2017 1429   APPEARANCEUR CLEAR 06/03/2017 1429   LABSPEC 1.030 06/03/2017 1429   PHURINE 5.0 06/03/2017 1429   GLUCOSEU >=500 (A) 06/03/2017 1429   HGBUR NEGATIVE 06/03/2017 1429   BILIRUBINUR NEGATIVE 06/03/2017 1429   BILIRUBINUR negative 09/11/2016 1038   KETONESUR 20 (A) 06/03/2017 1429   PROTEINUR NEGATIVE 06/03/2017 1429   UROBILINOGEN 0.2 09/11/2016 1038   UROBILINOGEN 0.2 10/28/2013 1723   NITRITE NEGATIVE 06/03/2017 1429   LEUKOCYTESUR NEGATIVE 06/03/2017 1429    Sepsis Labs: @LABRCNTIP (procalcitonin:4,lacticidven:4) )No results found for this or any previous visit (from the past 240 hour(s)).   Radiological Exams  on Admission: Dg Chest 2 View  Result Date: 06/03/2017 CLINICAL DATA:  Nausea and vomiting.  Central chest  pain.  Cough. EXAM: CHEST  2 VIEW COMPARISON:  04/05/2017. FINDINGS: Normal sized heart. Clear lungs with normal vascularity. Stable mild diffuse peribronchial thickening and accentuation of the interstitial markings. Unremarkable bones. IMPRESSION: No acute abnormality.  Stable mild chronic bronchitic changes. Electronically Signed   By: Claudie Revering M.D.   On: 06/03/2017 13:15   Ct Abdomen Pelvis W Contrast  Result Date: 06/03/2017 CLINICAL DATA:  56 year old diabetic hypertensive female with lower abdominal pain, nausea, vomiting and diarrhea since early this morning. Initial encounter. EXAM: CT ABDOMEN AND PELVIS WITH CONTRAST TECHNIQUE: Multidetector CT imaging of the abdomen and pelvis was performed using the standard protocol following bolus administration of intravenous contrast. CONTRAST:  138mL ISOVUE-300 IOPAMIDOL (ISOVUE-300) INJECTION 61% COMPARISON:  10/16/2013 and 09/18/2013 CT FINDINGS: Lower chest: No worrisome lung base abnormality. Hepatobiliary: Enlarged fatty liver spanning over 21.8 cm without worrisome hepatic lesion. Phrygian cap with progressive enhancement. Elective ultrasound recommended to determine if there is an underlying polypoid lesion. Pancreas: No pancreatic mass or inflammation. Spleen: No mass or enlargement. Adrenals/Urinary Tract: No hydronephrosis or obstructing stone. No renal or adrenal mass. Noncontrast filled views of the urinary bladder unremarkable. Stomach/Bowel: Majority of the colon is under distended limiting evaluation for detection of colitis. Scattered diverticula. No extraluminal bowel inflammatory process, free fluid or free air. Radiopaque material in the appendix may be related to contrast or stones. No CT evidence of inflammation of the appendix. Mild circumferential thickening distal esophagus unchanged from prior exams and may be normal for this patient. Vascular/Lymphatic: No aneurysm or large vessel occlusion. No adenopathy. Reproductive: Post  hysterectomy.  No worrisome adnexal abnormality. Other: Periumbilical fat and vessel containing hernia. Musculoskeletal: Mild curvature lumbar spine convex right. No worrisome osseous abnormality. IMPRESSION: Majority of the colon is under distended. This gives the appearance of circumferential wall thickening and therefore limits evaluation for the possibility of mild colitis. There is however, no evidence of extraluminal bowel inflammatory process, free fluid or free air. No inflammation surrounds scattered diverticula or appendix. Enlarged fatty liver spanning over 21.8 cm. Phrygian cap with progressive enhancement. Elective ultrasound recommended to determine if there is an underlying polypoid lesion. Periumbilical fat and vessel containing hernia. Post hysterectomy. Electronically Signed   By: Genia Del M.D.   On: 06/03/2017 14:50    EKG: Independently reviewed.  Assessment/Plan Active Problems:   Nausea   Type 2 diabetes mellitus with ketoacidosis without coma, with long-term current use of insulin ([name])   Hyperglycemia   Vomiting and diarrhea   Hypothyroidism, acquired   B12 deficiency   Hyperlipidemia   Generalized anxiety disorder   Peripheral neuropathy ([name])   GERD   Irritable bowel syndrome   Essential hypertension   Hepatic lesion   Nonischemic cardiomyopathy ([name])   Vomiting  Nausea, Vomiting and DIarrhea, rule out  infectious, with a component of marijuana induced vomiting and hyperglycemia (see below) . History of IBS No further  emesis or diarrhea since admission and since receiving  Reglan and antiemetics LA  Was 4.87-> 3.81. Received Vanc and Zosyn and 3 L IVF  Lipase 26 . WBC 20 Cdiff pending. Afebrile  Admit to inpatient SDU Continue antiemetics  PPI  Continue Vanc and Zosyn IVF Patient was educated on the marijuana induced vomiting.  Blood cultures  Serial lactic acid   Elevated Blood Sugar in a patient with DM2  precipitated by missing insulin  dose and  possible infection. Low suspicion for DKA   Admission Glucose was 468, AG 18, Bicarb 18.  Received   3l IV NS, Received 8 U insulin with some response in the 380s , Patient is not always compliant with insulin. Last A1C 10 in 03/2017  Admit for SDU Serial lactic acid  NPO for now  CMET  If BS continue to be high, will place patient on drip  DIabetes educator IVF     Hypertension BP 100/65   Pulse 96    Continue home anti-hypertensive medications    GERD, no acute symptoms Continue PPI   Hyperlipidemia Continue home statins   Generalized Anxiety  Disorder  Continue home Zoloft   Hypothyroidism: Continue home Synthroid   DVT prophylaxis: Lovenox  Code Status:   Full      Family Communication:  Discussed with patient Disposition Plan: Expect patient to be discharged to home after condition improves Consults called:    None  Admission status:T  SDU    [place], PA-C [facility]   06/03/2017, 5:53 PM

## 2017-06-03 NOTE — ED Notes (Signed)
Pt ambulated to restroom located in room, tolerated well. 

## 2017-06-03 NOTE — ED Notes (Signed)
Attempted to collect second blood culture . Was unsuccessful. Phlebotomy aware

## 2017-06-03 NOTE — ED Notes (Signed)
MD aware pt needing nausea medicine

## 2017-06-03 NOTE — ED Notes (Signed)
Patient aware that a urine sample is needed. Patient will notify staff when able to provide one.

## 2017-06-03 NOTE — Progress Notes (Signed)
Pharmacy Antibiotic Note  [name] [name] is a 56 y.o. female admitted on 06/03/2017 with N/V, WBC 20.2, LA 4.8.  Plan: -Vancomycin 1 g IV x1 then 750/12h -Zosyn 3.375 g IV q8h -Monitor renal fx, cultures, duration of therapy   Height: 5\' 1"  (154.9 cm) Weight: 129 lb (58.5 kg) IBW/kg (Calculated) : 47.8  Temp (24hrs), Avg:97.5 F (36.4 C), Min:97.5 F (36.4 C), Max:97.5 F (36.4 C)   Recent Labs Lab 06/03/17 1135 06/03/17 1156 06/03/17 1157  WBC 20.2*  --   --   CREATININE 0.87 0.50  --   LATICACIDVEN  --   --  4.87*    Estimated Creatinine Clearance: 66.9 mL/min (by C-G formula based on SCr of 0.5 mg/dL).    Allergies  Allergen Reactions  . Codeine Hives, Anxiety and Other (See Comments)    Antimicrobials this admission: 6/25 vancomycin > 6/25 zosyn >  Dose adjustments this admission: N/A  Microbiology results: 6/25 blood cx: 6/25 urine cx:  Thank you for allowing pharmacy to be a part of this patient's care.  Harvel Quale 06/03/2017 1:27 PM

## 2017-06-03 NOTE — ED Notes (Signed)
Dr. Billy Fischer notified of elevated CG-4

## 2017-06-03 NOTE — ED Provider Notes (Signed)
[facility] DEPT Provider Note   CSN: [id number] Arrival date & time: 06/03/17  1107     History   Chief Complaint Chief Complaint  Patient presents with  . Nausea  . Emesis    HPI [name] [name] is a 56 y.o. female.  HPI  56 year old female with a history of type 2 diabetes, hypothyroidism, hyperlipidemia, anxiety, hypertension, chronic kidney disease presents with concern for nausea, vomiting and diarrhea.  Patient reports that symptoms began about 4 this morning. Reports she's vomited over 10 times, and she's had over 10 episodes of diarrhea, one each time she has a vomiting episode. Denies black or bloody stools, denies hematemesis. Reports she's had diffuse lower abdominal pain which is severe, 10 out of 10. Also reports a dull central chest pain without radiation. Reports associated cough, chills. Denies sick contacts, recent antibiotics or travel. Denies leg pain or swelling. Reports she's had hyperglycemia. Reports uses marijuana to help settle her stomach. Tried zofran at home with no relief.   Past Medical History:  Diagnosis Date  . Anxiety   . B12 deficiency   . Chest pain 04/05/2017  . GERD (gastroesophageal reflux disease)   . Hyperlipidemia   . Hypertension    [name]  . Hypothyroidism   . IBS (irritable bowel syndrome)   . Intermittent vertigo   . 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 Leg cramping    "@ night" (09/18/2013)  . Migraine    "once q couple months" (09/18/2013)  . Osteoporosis   . Peripheral neuropathy   . Type II diabetes mellitus ([place])   . Umbilical hernia    unrepaired (09/18/2013)    Patient Active Problem List   Diagnosis Date Noted  . Vomiting and diarrhea 06/03/2017  . Dysphagia   . Hyperglycemia 04/05/2017  . Nonischemic cardiomyopathy ([place]) 03/22/2017  . Hypokalemia 02/15/2017  . NSTEMI (non-ST elevated myocardial infarction) ([place]) 02/11/2017  . Cardiac enzymes elevated   . Chest pain 02/09/2017   . GERD (gastroesophageal reflux disease) 02/09/2017  . H. pylori infection 02/09/2017  . Early satiety 01/22/2017  . DKA, type 2 ([place]) 07/24/2016  . Type 2 diabetes mellitus with ketoacidosis without coma, with long-term current use of insulin ([place]) 07/17/2016  . Atrial fibrillation ([place]) 02/01/2015  . 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 Nausea 10/28/2013  . Hepatic lesion 09/18/2013  . Vitamin D deficiency 05/20/2012  . Essential hypertension 02/01/2011  . Osteoporosis 02/01/2011  . B12 deficiency 04/04/2010  . Peripheral neuropathy ([place]) 05/10/2009  . GERD 04/08/2009  . Generalized anxiety disorder 06/22/2008  . Hypothyroidism, acquired 04/29/2008  . Hyperlipidemia 04/29/2008  . Irritable bowel syndrome 04/29/2008    Past Surgical History:  Procedure Laterality Date  . CESAREAN SECTION  1989  . LAPAROSCOPIC ASSISTED VAGINAL HYSTERECTOMY  09/23/2012   Procedure: LAPAROSCOPIC ASSISTED VAGINAL HYSTERECTOMY;  Surgeon: Linda Hedges, DO;  Location: [facility];  Service: Gynecology;  Laterality: N/A;  pull Dr Gregor Hams instrument  . LEFT HEART CATH AND CORONARY ANGIOGRAPHY N/A 02/11/2017   Procedure: Left Heart Cath and Coronary Angiography;  Surgeon: Lorretta Harp, MD;  Location: [facility];  Service: Cardiovascular;  Laterality: N/A;  . TUBAL LIGATION Bilateral 1992  . VAGINAL HYSTERECTOMY  2013    OB History    Gravida Para Term Preterm AB Living   2 2           SAB TAB Ectopic Multiple Live Births  Home Medications    Prior to Admission medications   Medication Sig Start Date End Date Taking? Authorizing Provider  aspirin 81 MG chewable tablet Chew 1 tablet (81 mg total) by mouth daily. 02/12/17  Yes Jani Gravel, MD  atorvastatin (LIPITOR) 80 MG tablet Take 1 tablet (80 mg total) by mouth daily at 6 PM. 09/04/16  Yes Bedsole, Amy E, MD  cetirizine (ZYRTEC) 10 MG tablet Take 1 tablet (10 mg total) by mouth daily. MUST  SCHEDULE ANNUAL EXAM 05/20/17  Yes Baity, Coralie Keens, NP  Continuous Blood Gluc Receiver (FREESTYLE LIBRE READER) DEVI 1 Device by Does not apply route 3 (three) times daily. 04/03/17  Yes Philemon Kingdom, MD  Continuous Blood Gluc Sensor (FREESTYLE LIBRE SENSOR SYSTEM) MISC 1 Device by Does not apply route every 30 (thirty) days. 04/03/17  Yes Philemon Kingdom, MD  ezetimibe (ZETIA) 10 MG tablet Take 1 tablet (10 mg total) by mouth daily. 09/11/16  Yes Bedsole, Amy E, MD  Insulin Glargine (BASAGLAR KWIKPEN) 100 UNIT/ML SOPN Inject 0.45 mLs (45 Units total) into the skin daily at 10 pm. INJECT 55 UNITS INTO THE SKIN DAILY AT 10 PM. 04/08/17  Yes Reyne Dumas, MD  insulin lispro (HUMALOG KWIKPEN) 100 UNIT/ML KiwkPen Inject 0.08 mLs (8 Units total) into the skin 3 (three) times daily. Patient taking differently: Inject 12 Units into the skin 3 (three) times daily.  04/08/17  Yes Reyne Dumas, MD  levothyroxine (SYNTHROID, LEVOTHROID) 150 MCG tablet Take 1 tablet (150 mcg total) by mouth daily. 04/03/17  Yes Philemon Kingdom, MD  losartan (COZAAR) 25 MG tablet Take 25 mg by mouth daily. 05/27/17  Yes [provider]  metoprolol succinate (TOPROL-XL) 25 MG 24 hr tablet Take 25 mg by mouth daily. 03/24/17  Yes [provider]  omeprazole (PRILOSEC) 40 MG capsule Take 1 capsule (40 mg total) by mouth 2 (two) times daily. 03/19/17  Yes Bedsole, Amy E, MD  ondansetron (ZOFRAN) 4 MG tablet TAKE 1 TABLET (4 MG TOTAL) BY MOUTH EVERY 6 (SIX) HOURS. 05/03/17  Yes Bedsole, Amy E, MD  pantoprazole (PROTONIX) 40 MG tablet Take 1 tablet (40 mg total) by mouth daily. 04/08/17  Yes Reyne Dumas, MD  RELPAX 40 MG tablet ONE TABLET BY MOUTH AT ONSET OF HEADACHE. MAY REPEAT IN 2 HOURS IF HEADACHE PERSISTS OR RECURS. 10/17/16  Yes Bedsole, Amy E, MD  sertraline (ZOLOFT) 50 MG tablet Take 1 tablet (50 mg total) by mouth daily. 05/31/17  Yes Jinny Sanders, MD    Family History Family History  Problem Relation  Age of Onset  . Diabetes Other   . Heart disease Other   . Heart failure Mother   . Diabetes Maternal Grandmother   . Alzheimer's disease Maternal Grandmother   . Coronary artery disease Maternal Grandmother   . Heart attack Maternal Grandfather   . Heart failure Brother     Social History Social History  Substance Use Topics  . Smoking status: Former Smoker    Years: 0.20    Types: Cigarettes    Quit date: 12/10/1990  . Smokeless tobacco: Never Used  . Alcohol use No     Allergies   Codeine   Review of Systems Review of Systems  Constitutional: Positive for chills and fatigue. Negative for fever.  HENT: Negative for sore throat.   Eyes: Negative for visual disturbance.  Respiratory: Positive for cough. Negative for shortness of breath.   Cardiovascular: Positive for chest pain. Negative for leg swelling.  Gastrointestinal: Positive for diarrhea, nausea and vomiting. Negative for abdominal pain.  Endocrine: Positive for polyuria.  Genitourinary: Positive for frequency (from hyperglycemia per pt). Negative for difficulty urinating and dysuria.  Musculoskeletal: Negative for back pain and neck pain.  Skin: Negative for rash.  Neurological: Negative for syncope and headaches.     Physical Exam Updated Vital Signs BP 134/81   Pulse 100   Temp 97.5 F (36.4 C) (Oral)   Resp 17   Ht 5\' 1"  (1.549 m)   Wt 58.5 kg (129 lb)   LMP 08/11/2012   SpO2 97%   BMI 24.37 kg/m   Physical Exam  Constitutional: She is oriented to person, place, and time. She appears well-developed and well-nourished. No distress.  HENT:  Head: Normocephalic and atraumatic.  Eyes: Conjunctivae and EOM are normal.  Neck: Normal range of motion.  Cardiovascular: Normal rate, regular rhythm, normal heart sounds and intact distal pulses.  Exam reveals no gallop and no friction rub.   No murmur heard. Pulmonary/Chest: Effort normal and breath sounds normal. No respiratory distress. She has no  wheezes. She has no rales.  Abdominal: Soft. She exhibits no distension. There is tenderness. There is guarding (lower abdomen).  Musculoskeletal: She exhibits no edema or tenderness.  Neurological: She is alert and oriented to person, place, and time.  Skin: Skin is warm and dry. No rash noted. She is not diaphoretic. No erythema.  Nursing note and vitals reviewed.    ED Treatments / Results  Labs (all labs ordered are listed, but only abnormal results are displayed) Labs Reviewed  CBC WITH DIFFERENTIAL/PLATELET - Abnormal; Notable for the following:       Result Value   WBC 20.2 (*)    RBC 5.45 (*)    Hemoglobin 16.5 (*)    Neutro Abs 17.6 (*)    All other components within normal limits  COMPREHENSIVE METABOLIC PANEL - Abnormal; Notable for the following:    CO2 18 (*)    Glucose, Bld 468 (*)    Anion gap 18 (*)    All other components within normal limits  URINALYSIS, ROUTINE W REFLEX MICROSCOPIC - Abnormal; Notable for the following:    Color, Urine COLORLESS (*)    Glucose, UA >=500 (*)    Ketones, ur 20 (*)    Squamous Epithelial / LPF 0-5 (*)    All other components within normal limits  MAGNESIUM - Abnormal; Notable for the following:    Magnesium 1.6 (*)    All other components within normal limits  RAPID URINE DRUG SCREEN, HOSP PERFORMED - Abnormal; Notable for the following:    Tetrahydrocannabinol POSITIVE (*)    All other components within normal limits  CBG MONITORING, ED - Abnormal; Notable for the following:    Glucose-Capillary 453 (*)    All other components within normal limits  I-STAT CHEM 8, ED - Abnormal; Notable for the following:    Glucose, Bld 486 (*)    Calcium, Ion 1.05 (*)    Hemoglobin 16.7 (*)    HCT 49.0 (*)    All other components within normal limits  I-STAT CG4 LACTIC ACID, ED - Abnormal; Notable for the following:    Lactic Acid, Venous 4.87 (*)    All other components within normal limits  I-STAT VENOUS BLOOD GAS, ED - Abnormal;  Notable for the following:    pH, Ven 7.485 (*)    pCO2, Ven 25.6 (*)    pO2, Ven 87.0 (*)  Bicarbonate 19.3 (*)    All other components within normal limits  I-STAT CG4 LACTIC ACID, ED - Abnormal; Notable for the following:    Lactic Acid, Venous 3.15 (*)    All other components within normal limits  CBG MONITORING, ED - Abnormal; Notable for the following:    Glucose-Capillary 375 (*)    All other components within normal limits  CULTURE, BLOOD (ROUTINE X 2)  CULTURE, BLOOD (ROUTINE X 2)  URINE CULTURE  C DIFFICILE QUICK SCREEN W PCR REFLEX  CULTURE, BLOOD (ROUTINE X 2)  CULTURE, BLOOD (ROUTINE X 2)  LIPASE, BLOOD  LACTIC ACID, PLASMA  LACTIC ACID, PLASMA  PROCALCITONIN  PROTIME-INR  APTT  BASIC METABOLIC PANEL  COMPREHENSIVE METABOLIC PANEL  CBC  I-STAT TROPOININ, ED    EKG  EKG Interpretation  Date/Time:  Monday June 03 2017 11:49:55 EDT Ventricular Rate:  89 PR Interval:    QRS Duration: 95 QT Interval:  423 QTC Calculation: 515 R Axis:   96 Text Interpretation:  Sinus arrhythmia Borderline right axis deviation Prolonged QT interval No significant change since last tracing Confirmed by Gareth Morgan [phone]) on 06/03/2017 5:05:13 PM       Radiology Dg Chest 2 View  Result Date: 06/03/2017 CLINICAL DATA:  Nausea and vomiting.  Central chest pain.  Cough. EXAM: CHEST  2 VIEW COMPARISON:  04/05/2017. FINDINGS: Normal sized heart. Clear lungs with normal vascularity. Stable mild diffuse peribronchial thickening and accentuation of the interstitial markings. Unremarkable bones. IMPRESSION: No acute abnormality.  Stable mild chronic bronchitic changes. Electronically Signed   By: Claudie Revering M.D.   On: 06/03/2017 13:15   Ct Abdomen Pelvis W Contrast  Result Date: 06/03/2017 CLINICAL DATA:  56 year old diabetic hypertensive female with lower abdominal pain, nausea, vomiting and diarrhea since early this morning. Initial encounter. EXAM: CT ABDOMEN AND PELVIS  WITH CONTRAST TECHNIQUE: Multidetector CT imaging of the abdomen and pelvis was performed using the standard protocol following bolus administration of intravenous contrast. CONTRAST:  116mL ISOVUE-300 IOPAMIDOL (ISOVUE-300) INJECTION 61% COMPARISON:  10/16/2013 and 09/18/2013 CT FINDINGS: Lower chest: No worrisome lung base abnormality. Hepatobiliary: Enlarged fatty liver spanning over 21.8 cm without worrisome hepatic lesion. Phrygian cap with progressive enhancement. Elective ultrasound recommended to determine if there is an underlying polypoid lesion. Pancreas: No pancreatic mass or inflammation. Spleen: No mass or enlargement. Adrenals/Urinary Tract: No hydronephrosis or obstructing stone. No renal or adrenal mass. Noncontrast filled views of the urinary bladder unremarkable. Stomach/Bowel: Majority of the colon is under distended limiting evaluation for detection of colitis. Scattered diverticula. No extraluminal bowel inflammatory process, free fluid or free air. Radiopaque material in the appendix may be related to contrast or stones. No CT evidence of inflammation of the appendix. Mild circumferential thickening distal esophagus unchanged from prior exams and may be normal for this patient. Vascular/Lymphatic: No aneurysm or large vessel occlusion. No adenopathy. Reproductive: Post hysterectomy.  No worrisome adnexal abnormality. Other: Periumbilical fat and vessel containing hernia. Musculoskeletal: Mild curvature lumbar spine convex right. No worrisome osseous abnormality. IMPRESSION: Majority of the colon is under distended. This gives the appearance of circumferential wall thickening and therefore limits evaluation for the possibility of mild colitis. There is however, no evidence of extraluminal bowel inflammatory process, free fluid or free air. No inflammation surrounds scattered diverticula or appendix. Enlarged fatty liver spanning over 21.8 cm. Phrygian cap with progressive enhancement. Elective  ultrasound recommended to determine if there is an underlying polypoid lesion. Periumbilical fat and vessel containing hernia. Post  hysterectomy. Electronically Signed   By: Genia Del M.D.   On: 06/03/2017 14:50    Procedures Procedures (including critical care time)  Medications Ordered in ED Medications  piperacillin-tazobactam (ZOSYN) IVPB 3.375 g (not administered)  vancomycin (VANCOCIN) IVPB 750 mg/150 ml premix (not administered)  losartan (COZAAR) tablet 25 mg (not administered)  sertraline (ZOLOFT) tablet 50 mg (not administered)  pantoprazole (PROTONIX) EC tablet 40 mg (not administered)  levothyroxine (SYNTHROID, LEVOTHROID) tablet 150 mcg (not administered)  metoprolol succinate (TOPROL-XL) 24 hr tablet 25 mg (not administered)  aspirin chewable tablet 81 mg (not administered)  ezetimibe (ZETIA) tablet 10 mg (not administered)  atorvastatin (LIPITOR) tablet 80 mg (not administered)  0.9 %  sodium chloride infusion (not administered)  acetaminophen (TYLENOL) tablet 650 mg (not administered)    Or  acetaminophen (TYLENOL) suppository 650 mg (not administered)  ondansetron (ZOFRAN) tablet 4 mg (not administered)    Or  ondansetron (ZOFRAN) injection 4 mg (not administered)  enoxaparin (LOVENOX) injection 40 mg (not administered)  insulin aspart (novoLOG) injection 0-9 Units (not administered)  magnesium sulfate IVPB 2 g 50 mL (not administered)  sodium chloride 0.9 % bolus 1,000 mL (0 mLs Intravenous Stopped 06/03/17 1525)  ondansetron (ZOFRAN) injection 4 mg (4 mg Intravenous Given 06/03/17 1144)  sodium chloride 0.9 % bolus 1,000 mL (0 mLs Intravenous Stopped 06/03/17 1434)  metoCLOPramide (REGLAN) injection 10 mg (10 mg Intravenous Given 06/03/17 1205)  piperacillin-tazobactam (ZOSYN) IVPB 3.375 g (0 g Intravenous Stopped 06/03/17 1434)  iopamidol (ISOVUE-300) 61 % injection (100 mLs  Contrast Given 06/03/17 1358)  vancomycin (VANCOCIN) IVPB 1000 mg/200 mL premix (0 mg  Intravenous Stopped 06/03/17 1544)  metroNIDAZOLE (FLAGYL) IVPB 500 mg (500 mg Intravenous New Bag/Given 06/03/17 1546)  metoCLOPramide (REGLAN) injection 10 mg (10 mg Intravenous Given 06/03/17 1523)  sodium chloride 0.9 % bolus 1,000 mL (1,000 mLs Intravenous New Bag/Given 06/03/17 1544)  insulin aspart (novoLOG) injection 8 Units (8 Units Intravenous Given 06/03/17 1551)   CRITICAL CARE: sepsis, hyperglycemia, lactic acidosis Performed by: Alvino Chapel   Total critical care time: 30 minutes  Critical care time was exclusive of separately billable procedures and treating other patients.  Critical care was necessary to treat or prevent imminent or life-threatening deterioration.  Critical care was time spent personally by me on the following activities: development of treatment plan with patient and/or surrogate as well as nursing, discussions with consultants, evaluation of patient's response to treatment, examination of patient, obtaining history from patient or surrogate, ordering and performing treatments and interventions, ordering and review of laboratory studies, ordering and review of radiographic studies, pulse oximetry and re-evaluation of patient's condition.   Initial Impression / Assessment and Plan / ED Course  I have reviewed the triage vital signs and the nursing notes.  Pertinent labs & imaging results that were available during my care of the patient were reviewed by me and considered in my medical decision making (see chart for details).     57 year old female with a history of type 2 diabetes, hypothyroidism, hyperlipidemia, anxiety, hypertension, chronic kidney disease presents with concern for nausea, vomiting and diarrhea.  Patient with a lactic acid of 4.87. With possible etiologies being severe dehydration in the setting nausea and vomiting, possible sepsis, less likely ischemic bowel.  Initially ordered 2 L of IV normal saline, which should be 30 mL/kg, as  well as Zosyn for possible urinary or intra-abdominal cause of sepsis.  When other labs returned and showed a leukocytosis of 20,000, and  added additional vancomycin onto antibiotics, and officially called code sepsis.   CT abdomen and pelvis showed no acute findings. Discussed finding of phrygian cap, or gallbladder abnormality with patient and recommended outpatient ultrasound.  Patient also reported chest pain. Chest x-ray shows no acute findings. Troponin is negative. EKG is without acute changes.  Labs also significant for hyperglycemia with a bicarbonate of 18, anion gap of 18, and 20 ketones in the urine. Overall, suspect elevated anion gap is likely multifactorial, largely due to her lactic acidosis, and feel her mild DKA may be appropriately managed by IV insulin boluses and monitoring. Discussed this with the inpatient team who will evaluate.  Repeat lactic acid 3.15. Ordered 3rd liter of NS.  We'll admit patient to the hospitalist service for concern for dehydration, lactic acidosis, metabolic acidosis, hyperglycemia.  She has been covered for sepsis from potential unknown source with vancomycin, Zosyn, and was given Flagyl given significant diarrhea for possible C. Difficile with stool sample and blood cx pending. Her nausea is improved with reglan.  She was given insulin for hyperglycemia, inpt team to follow, decide whether to initiate gtt given more likely lactic acidosis contributing to anion gap.     Final Clinical Impressions(s) / ED Diagnoses   Final diagnoses:  Lactic acidosis  Leukocytosis, unspecified type  Lower abdominal pain  Nausea vomiting and diarrhea  Hyperglycemia  Metabolic acidosis    New Prescriptions New Prescriptions   No medications on file     Gareth Morgan, MD 06/03/17 1719

## 2017-06-03 NOTE — ED Notes (Signed)
Pt used bed pan to void.

## 2017-06-03 NOTE — ED Triage Notes (Signed)
Patient from home via [facility] complaining of nausea and vomiting 10 x since 4am this morning.  Patient has taken zofran at home with no relief. Patient also complaining of center dull chest pain. Patient has history orf diabetes and reports her self check glucose monitor read 401. Patient alert and oriented x 4

## 2017-06-03 NOTE — ED Notes (Signed)
Medications delayed due to being unverified

## 2017-06-04 ENCOUNTER — Encounter (HOSPITAL_COMMUNITY): Payer: Self-pay | Admitting: General Practice

## 2017-06-04 DIAGNOSIS — E784 Other hyperlipidemia: Secondary | ICD-10-CM

## 2017-06-04 DIAGNOSIS — E1165 Type 2 diabetes mellitus with hyperglycemia: Secondary | ICD-10-CM

## 2017-06-04 DIAGNOSIS — R112 Nausea with vomiting, unspecified: Secondary | ICD-10-CM | POA: Diagnosis present

## 2017-06-04 DIAGNOSIS — F411 Generalized anxiety disorder: Secondary | ICD-10-CM

## 2017-06-04 DIAGNOSIS — Z794 Long term (current) use of insulin: Secondary | ICD-10-CM

## 2017-06-04 DIAGNOSIS — E118 Type 2 diabetes mellitus with unspecified complications: Secondary | ICD-10-CM

## 2017-06-04 DIAGNOSIS — Q441 Other congenital malformations of gallbladder: Secondary | ICD-10-CM

## 2017-06-04 DIAGNOSIS — E038 Other specified hypothyroidism: Secondary | ICD-10-CM

## 2017-06-04 DIAGNOSIS — E1159 Type 2 diabetes mellitus with other circulatory complications: Secondary | ICD-10-CM

## 2017-06-04 DIAGNOSIS — I5022 Chronic systolic (congestive) heart failure: Secondary | ICD-10-CM

## 2017-06-04 DIAGNOSIS — F1910 Other psychoactive substance abuse, uncomplicated: Secondary | ICD-10-CM

## 2017-06-04 LAB — URINE CULTURE

## 2017-06-04 LAB — GASTROINTESTINAL PANEL BY PCR, STOOL (REPLACES STOOL CULTURE)
ASTROVIRUS: NOT DETECTED
Adenovirus F40/41: NOT DETECTED
Campylobacter species: NOT DETECTED
Cryptosporidium: NOT DETECTED
Cyclospora cayetanensis: NOT DETECTED
ENTAMOEBA HISTOLYTICA: NOT DETECTED
ENTEROTOXIGENIC E COLI (ETEC): NOT DETECTED
Enteroaggregative E coli (EAEC): NOT DETECTED
Enteropathogenic E coli (EPEC): NOT DETECTED
Giardia lamblia: NOT DETECTED
NOROVIRUS GI/GII: NOT DETECTED
Plesimonas shigelloides: NOT DETECTED
ROTAVIRUS A: NOT DETECTED
SALMONELLA SPECIES: NOT DETECTED
SHIGA LIKE TOXIN PRODUCING E COLI (STEC): NOT DETECTED
SHIGELLA/ENTEROINVASIVE E COLI (EIEC): NOT DETECTED
Sapovirus (I, II, IV, and V): NOT DETECTED
VIBRIO CHOLERAE: NOT DETECTED
Vibrio species: NOT DETECTED
Yersinia enterocolitica: NOT DETECTED

## 2017-06-04 LAB — PROCALCITONIN: PROCALCITONIN: 0.1 ng/mL

## 2017-06-04 LAB — C DIFFICILE QUICK SCREEN W PCR REFLEX
C DIFFICILE (CDIFF) INTERP: NOT DETECTED
C DIFFICLE (CDIFF) ANTIGEN: NEGATIVE
C Diff toxin: NEGATIVE

## 2017-06-04 LAB — CBC
HEMATOCRIT: 40.8 % (ref 36.0–46.0)
HEMOGLOBIN: 14.1 g/dL (ref 12.0–15.0)
MCH: 29.3 pg (ref 26.0–34.0)
MCHC: 34.6 g/dL (ref 30.0–36.0)
MCV: 84.8 fL (ref 78.0–100.0)
Platelets: 314 10*3/uL (ref 150–400)
RBC: 4.81 MIL/uL (ref 3.87–5.11)
RDW: 14.6 % (ref 11.5–15.5)
WBC: 16.4 10*3/uL — ABNORMAL HIGH (ref 4.0–10.5)

## 2017-06-04 LAB — COMPREHENSIVE METABOLIC PANEL
ALBUMIN: 3.8 g/dL (ref 3.5–5.0)
ALT: 14 U/L (ref 14–54)
ANION GAP: 10 (ref 5–15)
AST: 19 U/L (ref 15–41)
Alkaline Phosphatase: 53 U/L (ref 38–126)
BILIRUBIN TOTAL: 0.6 mg/dL (ref 0.3–1.2)
BUN: 11 mg/dL (ref 6–20)
CO2: 21 mmol/L — AB (ref 22–32)
Calcium: 8.8 mg/dL — ABNORMAL LOW (ref 8.9–10.3)
Chloride: 107 mmol/L (ref 101–111)
Creatinine, Ser: 0.7 mg/dL (ref 0.44–1.00)
GFR calc Af Amer: >60 mL/min (ref >60)
GFR calc non Af Amer: >60 mL/min (ref >60)
GLUCOSE: 210 mg/dL — AB (ref 65–99)
Potassium: 3.4 mmol/L — ABNORMAL LOW (ref 3.5–5.1)
SODIUM: 138 mmol/L (ref 135–145)
TOTAL PROTEIN: 6.2 g/dL — AB (ref 6.5–8.1)

## 2017-06-04 LAB — GLUCOSE, CAPILLARY
GLUCOSE-CAPILLARY: 168 mg/dL — AB (ref 65–99)
GLUCOSE-CAPILLARY: 149 mg/dL — AB (ref 65–99)
Glucose-Capillary: 218 mg/dL — ABNORMAL HIGH (ref 65–99)
Glucose-Capillary: 171 mg/dL — ABNORMAL HIGH (ref 65–99)

## 2017-06-04 LAB — LIPID PANEL
CHOL/HDL RATIO: 5.2 ratio
Cholesterol: 243 mg/dL — ABNORMAL HIGH (ref 0–200)
HDL: 47 mg/dL (ref >40)
LDL Cholesterol: 168 mg/dL — ABNORMAL HIGH (ref 0–99)
Triglycerides: 140 mg/dL (ref <150)
VLDL: 28 mg/dL (ref 0–40)

## 2017-06-04 LAB — LACTOFERRIN, FECAL, QUALITATIVE: LACTOFERRIN, FECAL, QUAL: POSITIVE — AB

## 2017-06-04 LAB — PROTIME-INR
INR: 1.01
Prothrombin Time: 13.3 seconds (ref 11.4–15.2)

## 2017-06-04 LAB — MAGNESIUM: MAGNESIUM: 2.1 mg/dL (ref 1.7–2.4)

## 2017-06-04 LAB — CBG MONITORING, ED
GLUCOSE-CAPILLARY: 304 mg/dL — AB (ref 65–99)
GLUCOSE-CAPILLARY: 249 mg/dL — AB (ref 65–99)

## 2017-06-04 LAB — APTT: APTT: 25 s (ref 24–36)

## 2017-06-04 MED ORDER — METOCLOPRAMIDE HCL 5 MG/ML IJ SOLN
5.0000 mg | Freq: Four times a day (QID) | INTRAMUSCULAR | Status: DC
  Administered 2017-06-04 – 2017-06-05 (×3): 5 mg via INTRAVENOUS
  Filled 2017-06-04 (×3): qty 2

## 2017-06-04 MED ORDER — METOCLOPRAMIDE HCL 5 MG/ML IJ SOLN
5.0000 mg | Freq: Three times a day (TID) | INTRAMUSCULAR | Status: DC
  Administered 2017-06-04 (×2): 5 mg via INTRAVENOUS
  Filled 2017-06-04 (×2): qty 2

## 2017-06-04 NOTE — ED Notes (Signed)
Pt had an episode of diarrhea. Pt cleaned up. Pt able to ambulate to bathroom with no issue

## 2017-06-04 NOTE — ED Notes (Signed)
Pt had a loose BM. Pt cleaned and changed

## 2017-06-04 NOTE — ED Notes (Addendum)
Pt had an episode of diarrhea. Pt cleaned and sheets changed

## 2017-06-04 NOTE — Progress Notes (Addendum)
Inpatient Diabetes Program Recommendations  AACE/ADA: New Consensus Statement on Inpatient Glycemic Control (2015)  Target Ranges:  Prepandial:   less than 140 mg/dL      Peak postprandial:   less than 180 mg/dL (1-2 hours)      Critically ill patients:  140 - 180 mg/dL   Results for ACELYNN, DEJONGE (MRN [id number]) as of 06/04/2017 10:59  Ref. Range 06/03/2017 11:15 06/03/2017 15:20 06/03/2017 19:51 06/04/2017 03:52 06/04/2017 04:52 06/04/2017 08:33  Glucose-Capillary Latest Ref Range: 65 - 99 mg/dL 453 (H) 375 (H) 286 (H) 304 (H) 249 (H) 218 (H)    Admit with: Nausea/ Vomiting/ Diarrhea/ Hyperglycemia  History: DM  Home DM Meds: Basaglar 55 units QHS       Humalog 20 units with large meals/ 15 units with small meals       Metformin 1000 mg BID  Current Insulin Orders: Novolog Sensitive Correction Scale/ SSI (0-9 units) TID AC         MD- Note patient saw her Endocrinologist (Dr. Cruzita Lederer with [facility]) on 04/03/17.    At that visit, patient was instructed to take the following meds: Basaglar 55 units QHS + Humalog 20 units with large meals/ 15 units with small meals + Metformin 1000 mg BID.  Patient was also started on the [facility] continuous glucose meter at that visit to help improve her compliance with checking CBGs at home.  MD- Please consider the following:  1. Start Lantus 28 units QHS (50% home dose of long-acting insulin)  2. Change Novolog SSI to Q4 hour coverage while NPO (currently ordered TID [facility])   Spoke with patient today.  Verified her home diabetes medications.  Stopped taking Metformin last admission (pt stated MD told her to stop Metformin).  Has insulin at home and uses [facility] continuous gluocse sensor for CBG readings.  Did not have any questions for me regarding her DM regimen.  Has follow-up appt with Endocrinology in August.      --Will follow patient during hospitalization--  Wyn Quaker RN, MSN, CDE Diabetes  Coordinator Inpatient Glycemic Control Team Team Pager: [phone] (8a-5p)

## 2017-06-04 NOTE — Progress Notes (Signed)
PROGRESS NOTE    [name] [name]  NFA:[id number] DOB: [birth year] DOA: 06/03/2017 PCP: Jinny Sanders, MD   Brief Narrative:  56 y.o. WF PMHx Anxiety, B12 deficiency, Gerd, HLD, HTN, Chronic Systolic CHF, Hypothyroidism, IBS,   DM type II uncontrolled with complication,  intermittent vertigo, Chronic Marijuana use,  Presenting with nausea, vomiting and diarrhea. The patient reports that the symptoms began about 4 this morning. She has vomited 10 times prior to admission, he had 10 episodes of diarrhea prior to presentation. She denies any black or bloody stools. She denies hematemesis. She complains of diffuse lower abdominal pain, severe with abdominal guarding. She also reports reflux symptoms but not cardiac complaints.  She reports cough and chills without fever, with no shortness of breath. She denies any sick contacts, recent antibiotics, travel, or food poisoning. She denies any leg pain or swelling. Of note, she has used  Marijuana  this morning, 2 times and has done so daily  She did not take her morning medications today, including er insulin. She has polyuria but not dysuria or hematuria  Subjective: 6/26  A/O 4, negative CP, negative SOB, positive abdominal pain, negative nausea/vomiting, negative diarrhea     Assessment & Plan:   Active Problems:   Hypothyroidism, acquired   B12 deficiency   Hyperlipidemia   Generalized anxiety disorder   Peripheral neuropathy ([name])   GERD   Irritable bowel syndrome   Essential hypertension   Hepatic lesion   Nausea   Type 2 diabetes mellitus with ketoacidosis without coma, with long-term current use of insulin ([name])   Nonischemic cardiomyopathy ([name])   Hyperglycemia   Vomiting and diarrhea   Vomiting   N&V (nausea and vomiting)  Chronic drug abuse -UDS positive marijuana   Diabetic Gastroparesis/Nausea, Vomiting and DIarrhea,  -Unlikely secondary to infection will DC antibiotics -Most likely secondary to poorly controlled  diabetes resulting in gastroparesis, compounded by marijuana induced vomiting. -Diarrhea has resolved -Patient was educated on the marijuana induced vomiting.  -Reglan IV QID -Blood cultures NGTD -Start clear liquids diet  Phrygian cap  -Counseled patient that she did not have a lesion of the gallbladder but a folded portion of the gallbladder. -Recommended that patient get elective ultrasound for further evaluation. Unless patient has SIGNIFICANT improvement in her ability to tolerate abdominal pressure, should be performed as an outpatient  DM type II uncontrolled with complications/Hyperglycemia    -4/27 Hemoglobin A1c= 10.8 -Precipitated by missing insulin dose and possible infection (unlikely).  -Lipid panel pending -Sensitive SSI until patient able to eat and then transition to her home regimen  Chronic systolic CHF -03/15/9628 Echocardiogram: LVEF= 35% to 40%. Hypokinesis of the mid-apicalanterior,   lateral, and inferior myocardium. -Losartan 25 mg daily -Toprol 25 mg daily  Hypertension complicating DM BP 528/41   Pulse 96    -see chronic CHF   Hyperlipidemia -Continue home statins: Lipitor 80 mg daily -Liver panel pending   Generalized Anxiety  Disorder  -Continue home Zoloft 50 mg daily  Hypothyroidism: -Continue home Synthroid 150 g daily      DVT prophylaxis: Lovenox Code Status: Full Family Communication: None Disposition Plan: Next 24-48 hours discharge when patient able to keep food/drink down   Consultants:  None   Procedures/Significant Events:  6/25 CT abdomen pelvis W contrast:-Enlarged fatty liver spanning over 21.8 cm. -Phrygian cap with progressive enhancement. -Periumbilical fat and vessel containing hernia.     VENTILATOR SETTINGS: None    Cultures 6/25 blood NGTD 6/25 urine positive multiple  species 6/25 GI panel pending 6/26 stool negative C. difficile    Antimicrobials: Anti-infectives    Start     Stop    06/04/17 0200  vancomycin (VANCOCIN) IVPB 750 mg/150 ml premix  Status:  Discontinued     06/03/17 2214   06/03/17 2000  piperacillin-tazobactam (ZOSYN) IVPB 3.375 g  Status:  Discontinued     06/04/17 1657   06/03/17 1515  metroNIDAZOLE (FLAGYL) IVPB 500 mg     06/03/17 1646   06/03/17 1400  vancomycin (VANCOCIN) IVPB 1000 mg/200 mL premix     06/03/17 1544   06/03/17 1245  piperacillin-tazobactam (ZOSYN) IVPB 3.375 g     06/03/17 1434      Devices None    LINES / TUBES:  None    Continuous Infusions: . sodium chloride 100 mL/hr at 06/03/17 1819  . piperacillin-tazobactam (ZOSYN)  IV 3.375 g (06/04/17 1305)     Objective: Vitals:   06/04/17 0727 06/04/17 0849 06/04/17 0931 06/04/17 1451  BP: 98/73 101/64  113/63  Pulse: 90 80 84 73  Resp: 19 16  17   Temp:  98.2 F (36.8 C)  98.3 F (36.8 C)  TempSrc:  Oral    SpO2: 98% 98%  100%  Weight:  127 lb 9.6 oz (57.9 kg)    Height:  5' (1.524 m)      Intake/Output Summary (Last 24 hours) at 06/04/17 1612 Last data filed at 06/04/17 1305  Gross per 24 hour  Intake          3126.67 ml  Output                0 ml  Net          3126.67 ml   Filed Weights   06/03/17 1122 06/04/17 0849  Weight: 129 lb (58.5 kg) 127 lb 9.6 oz (57.9 kg)    Examination:  General: A/O 4, No acute respiratory distress Eyes: negative scleral hemorrhage, negative anisocoria, negative icterus, extremely poor dentation ENT: Negative Runny nose, negative gingival bleeding, Neck:  Negative scars, masses, torticollis, lymphadenopathy, JVD Lungs: Clear to auscultation bilaterally without wheezes or crackles Cardiovascular: Regular rate and rhythm without murmur gallop or rub normal S1 and S2 Abdomen: Positive abdominal pain to palpation, nondistended, positive soft, bowel sounds, no rebound, no ascites, no appreciable mass Extremities: No significant cyanosis, clubbing, or edema bilateral lower extremities Skin: Negative rashes, lesions,  ulcers Psychiatric:  Negative depression, negative anxiety, negative fatigue, negative mania  Central nervous system:  Cranial nerves II through XII intact, tongue/uvula midline, all extremities muscle strength 5/5, sensation intact throughout, negative dysarthria, negative expressive aphasia, negative receptive aphasia.  .     Data Reviewed: Care during the described time interval was provided by me .  I have reviewed this patient's available data, including medical history, events of note, physical examination, and all test results as part of my evaluation. I have personally reviewed and interpreted all radiology studies.  CBC:  Recent Labs Lab 06/03/17 1135 06/03/17 1156 06/04/17 0609  WBC 20.2*  --  16.4*  NEUTROABS 17.6*  --   --   HGB 16.5* 16.7* 14.1  HCT 45.9 49.0* 40.8  MCV 84.2  --  84.8  PLT 367  --  176   Basic Metabolic Panel:  Recent Labs Lab 06/03/17 1135 06/03/17 1156 06/03/17 2134 06/04/17 0609  NA 137 137 135 138  K 3.6 3.6 3.5 3.4*  CL 101 102 106 107  CO2 18*  --  20* 21*  GLUCOSE 468* 486* 289* 210*  BUN 14 15 8 11   CREATININE 0.87 0.50 0.62 0.70  CALCIUM 9.8  --  8.4* 8.8*  MG 1.6*  --   --   --    GFR: Estimated Creatinine Clearance: 64.8 mL/min (by C-G formula based on SCr of 0.7 mg/dL). Liver Function Tests:  Recent Labs Lab 06/03/17 1135 06/04/17 0609  AST 23 19  ALT 16 14  ALKPHOS 79 53  BILITOT 0.9 0.6  PROT 8.1 6.2*  ALBUMIN 4.8 3.8    Recent Labs Lab 06/03/17 1135  LIPASE 26   No results for input(s): AMMONIA in the last 168 hours. Coagulation Profile:  Recent Labs Lab 06/04/17 0749  INR 1.01   Cardiac Enzymes: No results for input(s): CKTOTAL, CKMB, CKMBINDEX, TROPONINI in the last 168 hours. BNP (last 3 results) No results for input(s): PROBNP in the last 8760 hours. HbA1C: No results for input(s): HGBA1C in the last 72 hours. CBG:  Recent Labs Lab 06/03/17 1951 06/04/17 0352 06/04/17 0452  06/04/17 0833 06/04/17 1233  GLUCAP 286* 304* 249* 218* 168*   Lipid Profile: No results for input(s): CHOL, HDL, LDLCALC, TRIG, CHOLHDL, LDLDIRECT in the last 72 hours. Thyroid Function Tests: No results for input(s): TSH, T4TOTAL, FREET4, T3FREE, THYROIDAB in the last 72 hours. Anemia Panel: No results for input(s): VITAMINB12, FOLATE, FERRITIN, TIBC, IRON, RETICCTPCT in the last 72 hours. Urine analysis:    Component Value Date/Time   COLORURINE COLORLESS (A) 06/03/2017 1429   APPEARANCEUR CLEAR 06/03/2017 1429   LABSPEC 1.030 06/03/2017 1429   PHURINE 5.0 06/03/2017 1429   GLUCOSEU >=500 (A) 06/03/2017 1429   HGBUR NEGATIVE 06/03/2017 1429   BILIRUBINUR NEGATIVE 06/03/2017 1429   BILIRUBINUR negative 09/11/2016 1038   KETONESUR 20 (A) 06/03/2017 1429   PROTEINUR NEGATIVE 06/03/2017 1429   UROBILINOGEN 0.2 09/11/2016 1038   UROBILINOGEN 0.2 10/28/2013 1723   NITRITE NEGATIVE 06/03/2017 1429   LEUKOCYTESUR NEGATIVE 06/03/2017 1429   Sepsis Labs: @LABRCNTIP (procalcitonin:4,lacticidven:4)  ) Recent Results (from the past 240 hour(s))  Blood Culture (routine x 2)     Status: None (Preliminary result)   Collection Time: 06/03/17 12:43 PM  Result Value Ref Range Status   Specimen Description BLOOD LEFT HAND  Final   Special Requests   Final    BOTTLES DRAWN AEROBIC AND ANAEROBIC Blood Culture adequate volume   Culture NO GROWTH 1 DAY  Final   Report Status PENDING  Incomplete  Urine culture     Status: Abnormal   Collection Time: 06/03/17  2:29 PM  Result Value Ref Range Status   Specimen Description URINE, RANDOM  Final   Special Requests NONE  Final   Culture MULTIPLE SPECIES PRESENT, SUGGEST RECOLLECTION (A)  Final   Report Status 06/04/2017 FINAL  Final  Blood Culture (routine x 2)     Status: None (Preliminary result)   Collection Time: 06/03/17  3:24 PM  Result Value Ref Range Status   Specimen Description BLOOD RIGHT FOREARM  Final   Special Requests IN  PEDIATRIC BOTTLE Blood Culture adequate volume  Final   Culture NO GROWTH < 24 HOURS  Final   Report Status PENDING  Incomplete  C difficile quick scan w PCR reflex     Status: None   Collection Time: 06/04/17  2:56 AM  Result Value Ref Range Status   C Diff antigen NEGATIVE NEGATIVE Final   C Diff toxin NEGATIVE NEGATIVE Final   C Diff interpretation No C.  difficile detected.  Final         Radiology Studies: Dg Chest 2 View  Result Date: 06/03/2017 CLINICAL DATA:  Nausea and vomiting.  Central chest pain.  Cough. EXAM: CHEST  2 VIEW COMPARISON:  04/05/2017. FINDINGS: Normal sized heart. Clear lungs with normal vascularity. Stable mild diffuse peribronchial thickening and accentuation of the interstitial markings. Unremarkable bones. IMPRESSION: No acute abnormality.  Stable mild chronic bronchitic changes. Electronically Signed   By: Claudie Revering M.D.   On: 06/03/2017 13:15   Ct Abdomen Pelvis W Contrast  Result Date: 06/03/2017 CLINICAL DATA:  56 year old diabetic hypertensive female with lower abdominal pain, nausea, vomiting and diarrhea since early this morning. Initial encounter. EXAM: CT ABDOMEN AND PELVIS WITH CONTRAST TECHNIQUE: Multidetector CT imaging of the abdomen and pelvis was performed using the standard protocol following bolus administration of intravenous contrast. CONTRAST:  143mL ISOVUE-300 IOPAMIDOL (ISOVUE-300) INJECTION 61% COMPARISON:  10/16/2013 and 09/18/2013 CT FINDINGS: Lower chest: No worrisome lung base abnormality. Hepatobiliary: Enlarged fatty liver spanning over 21.8 cm without worrisome hepatic lesion. Phrygian cap with progressive enhancement. Elective ultrasound recommended to determine if there is an underlying polypoid lesion. Pancreas: No pancreatic mass or inflammation. Spleen: No mass or enlargement. Adrenals/Urinary Tract: No hydronephrosis or obstructing stone. No renal or adrenal mass. Noncontrast filled views of the urinary bladder unremarkable.  Stomach/Bowel: Majority of the colon is under distended limiting evaluation for detection of colitis. Scattered diverticula. No extraluminal bowel inflammatory process, free fluid or free air. Radiopaque material in the appendix may be related to contrast or stones. No CT evidence of inflammation of the appendix. Mild circumferential thickening distal esophagus unchanged from prior exams and may be normal for this patient. Vascular/Lymphatic: No aneurysm or large vessel occlusion. No adenopathy. Reproductive: Post hysterectomy.  No worrisome adnexal abnormality. Other: Periumbilical fat and vessel containing hernia. Musculoskeletal: Mild curvature lumbar spine convex right. No worrisome osseous abnormality. IMPRESSION: Majority of the colon is under distended. This gives the appearance of circumferential wall thickening and therefore limits evaluation for the possibility of mild colitis. There is however, no evidence of extraluminal bowel inflammatory process, free fluid or free air. No inflammation surrounds scattered diverticula or appendix. Enlarged fatty liver spanning over 21.8 cm. Phrygian cap with progressive enhancement. Elective ultrasound recommended to determine if there is an underlying polypoid lesion. Periumbilical fat and vessel containing hernia. Post hysterectomy. Electronically Signed   By: Genia Del M.D.   On: 06/03/2017 14:50        Scheduled Meds: . aspirin  81 mg Oral Daily  . atorvastatin  80 mg Oral q1800  . enoxaparin (LOVENOX) injection  40 mg Subcutaneous Q24H  . ezetimibe  10 mg Oral Daily  . insulin aspart  0-9 Units Subcutaneous TID WC  . levothyroxine  150 mcg Oral QAC breakfast  . losartan  25 mg Oral Daily  . metoCLOPramide (REGLAN) injection  5 mg Intravenous Q8H  . metoprolol succinate  25 mg Oral Daily  . pantoprazole  40 mg Oral Daily  . sertraline  50 mg Oral Daily   Continuous Infusions: . sodium chloride 100 mL/hr at 06/03/17 1819  .  piperacillin-tazobactam (ZOSYN)  IV 3.375 g (06/04/17 1305)     LOS: 1 day    Time spent: 40 minutes    Tayt Moyers, Geraldo Docker, MD [facility] Pager [phone]   If 7PM-7AM, please contact night-coverage [email or web address] Password [email or web address] 06/04/2017, 4:12 PM

## 2017-06-04 NOTE — ED Notes (Signed)
Pt c/o continued nausea despite Zofran. MD notified

## 2017-06-05 ENCOUNTER — Other Ambulatory Visit: Payer: Self-pay | Admitting: Family Medicine

## 2017-06-05 DIAGNOSIS — E1143 Type 2 diabetes mellitus with diabetic autonomic (poly)neuropathy: Principal | ICD-10-CM

## 2017-06-05 DIAGNOSIS — K3184 Gastroparesis: Secondary | ICD-10-CM

## 2017-06-05 LAB — BASIC METABOLIC PANEL
Anion gap: 10 (ref 5–15)
BUN: 14 mg/dL (ref 6–20)
CHLORIDE: 104 mmol/L (ref 101–111)
CO2: 23 mmol/L (ref 22–32)
CREATININE: 0.65 mg/dL (ref 0.44–1.00)
Calcium: 8.6 mg/dL — ABNORMAL LOW (ref 8.9–10.3)
GFR calc Af Amer: >60 mL/min (ref >60)
GLUCOSE: 230 mg/dL — AB (ref 65–99)
Potassium: 2.8 mmol/L — ABNORMAL LOW (ref 3.5–5.1)
SODIUM: 137 mmol/L (ref 135–145)

## 2017-06-05 LAB — GLUCOSE, CAPILLARY
Glucose-Capillary: 249 mg/dL — ABNORMAL HIGH (ref 65–99)
Glucose-Capillary: 155 mg/dL — ABNORMAL HIGH (ref 65–99)

## 2017-06-05 LAB — MAGNESIUM: MAGNESIUM: 1.8 mg/dL (ref 1.7–2.4)

## 2017-06-05 MED ORDER — ONDANSETRON HCL 4 MG PO TABS: 4.0000 mg | ORAL_TABLET | Freq: Four times a day (QID) | ORAL | 0 refills | Status: DC | PRN

## 2017-06-05 MED ORDER — POTASSIUM CHLORIDE ER 20 MEQ PO TBCR: 40.0000 meq | EXTENDED_RELEASE_TABLET | Freq: Every day | ORAL | 0 refills | Status: DC

## 2017-06-05 MED ORDER — METOCLOPRAMIDE HCL 10 MG PO TABS
5.0000 mg | ORAL_TABLET | Freq: Three times a day (TID) | ORAL | Status: DC
  Administered 2017-06-05 (×2): 5 mg via ORAL
  Filled 2017-06-05 (×2): qty 1

## 2017-06-05 MED ORDER — METOCLOPRAMIDE HCL 5 MG PO TABS: 5.0000 mg | ORAL_TABLET | Freq: Three times a day (TID) | ORAL | 0 refills | Status: DC

## 2017-06-05 MED ORDER — POTASSIUM CHLORIDE CRYS ER 20 MEQ PO TBCR
60.0000 meq | EXTENDED_RELEASE_TABLET | Freq: Once | ORAL | Status: AC
  Administered 2017-06-05: 60 meq via ORAL
  Filled 2017-06-05: qty 3

## 2017-06-05 NOTE — Discharge Summary (Signed)
[facility]  Physician Discharge Summary   Patient ID: [name] [name] MRN: [id number] DOB/AGE: 1961/11/28 56 y.o.  Admit date: 06/03/2017 Discharge date: 06/05/2017  PCP: Jinny Sanders, MD  DISCHARGE DIAGNOSES:  Active Problems:   Hypothyroidism, acquired   B12 deficiency   Hyperlipidemia   Generalized anxiety disorder   Peripheral neuropathy ([name])   GERD   Irritable bowel syndrome   Essential hypertension   Hepatic lesion   Nausea   Type 2 diabetes mellitus with ketoacidosis without coma, with long-term current use of insulin ([name])   Nonischemic cardiomyopathy ([name])   Hyperglycemia   Vomiting and diarrhea   Vomiting   N&V (nausea and vomiting)   RECOMMENDATIONS FOR OUTPATIENT FOLLOW UP: 1. Patient prescribed Reglan for presumed gastroparesis 2. Consider non-emergent outpatient gallbladder ultrasound to evaluate abnormal finding on CT 3. Needs outpatient follow-up off lipid panel as LDL is significantly high.  DISCHARGE CONDITION: fair  Diet recommendation: Modified carbohydrate  Filed Weights   06/03/17 1122 06/04/17 0849 06/05/17 0528  Weight: 58.5 kg (129 lb) 57.9 kg (127 lb 9.6 oz) 57.6 kg (127 lb)    INITIAL HISTORY: 56 year old Caucasian female presented with nausea, vomiting, diarrhea. Diarrhea resolved quickly. She has had episodes of nausea, vomiting, prolonged duration of time. Her presentation was thought to be due to gastroparesis.    HOSPITAL COURSE:   Chronic drug abuse UDS positive for marijuana. She was counseled to stop using marijuana.  Diabetic Gastroparesis Her symptoms quickly resolved. CT scan did raise concern for colitis. However, diarrhea resolved. She was afebrile. Leukocytosis is most likely due to dehydration. She was taken off of antibiotics. She is feeling better today. Diet was advanced. Reglan was given intravenously. Change to oral today. Ambulated. Wishes to go home. Blood cultures negative  Phrygian cap    -Counseled patient that she did not have a lesion of the gallbladder but a folded portion of the gallbladder. -Recommended that patient get elective ultrasound for further evaluation.   DM type II uncontrolled with complications/Hyperglycemia   -4/27 Hemoglobin A1c= 10.8 -Precipitated by missing insulin dose and possible infection (unlikely).  She may resume her home medication regimen.  Chronic systolic CHF -05/11/354 Echocardiogram: LVEF= 35% to 40%. Hypokinesis of the mid-apicalanterior, lateral, and inferior myocardium. Continue home medications. She was euvolemic.  Essential hypertension Continue home medication  Hyperlipidemia Continue home statins. LDL 168. Needs better control. Needs outpatient follow-up  Generalized AnxietyDisorder  Continue home Zoloft 50 mg daily  Hypothyroidism: Continue home Synthroid 150 g daily  Overall, stable. Low potassium level detected on blood work today. She was given oral supplementation. She was able to tolerated. Okay for discharge   PERTINENT LABS:  The results of significant diagnostics from this hospitalization (including imaging, microbiology, ancillary and laboratory) are listed below for reference.    Microbiology: Recent Results (from the past 240 hour(s))  Blood Culture (routine x 2)     Status: None (Preliminary result)   Collection Time: 06/03/17 12:43 PM  Result Value Ref Range Status   Specimen Description BLOOD LEFT HAND  Final   Special Requests   Final    BOTTLES DRAWN AEROBIC AND ANAEROBIC Blood Culture adequate volume   Culture NO GROWTH 2 DAYS  Final   Report Status PENDING  Incomplete  Urine culture     Status: Abnormal   Collection Time: 06/03/17  2:29 PM  Result Value Ref Range Status   Specimen Description URINE, RANDOM  Final   Special Requests NONE  Final  Culture MULTIPLE SPECIES PRESENT, SUGGEST RECOLLECTION (A)  Final   Report Status 06/04/2017 FINAL  Final  Blood Culture (routine x  2)     Status: None (Preliminary result)   Collection Time: 06/03/17  3:24 PM  Result Value Ref Range Status   Specimen Description BLOOD RIGHT FOREARM  Final   Special Requests IN PEDIATRIC BOTTLE Blood Culture adequate volume  Final   Culture NO GROWTH 2 DAYS  Final   Report Status PENDING  Incomplete  C difficile quick scan w PCR reflex     Status: None   Collection Time: 06/04/17  2:56 AM  Result Value Ref Range Status   C Diff antigen NEGATIVE NEGATIVE Final   C Diff toxin NEGATIVE NEGATIVE Final   C Diff interpretation No C. difficile detected.  Final  Gastrointestinal Panel by PCR , Stool     Status: None   Collection Time: 06/04/17  2:56 AM  Result Value Ref Range Status   Campylobacter species NOT DETECTED NOT DETECTED Final   Plesimonas shigelloides NOT DETECTED NOT DETECTED Final   Salmonella species NOT DETECTED NOT DETECTED Final   Yersinia enterocolitica NOT DETECTED NOT DETECTED Final   Vibrio species NOT DETECTED NOT DETECTED Final   Vibrio cholerae NOT DETECTED NOT DETECTED Final   Enteroaggregative E coli (EAEC) NOT DETECTED NOT DETECTED Final   Enteropathogenic E coli (EPEC) NOT DETECTED NOT DETECTED Final   Enterotoxigenic E coli (ETEC) NOT DETECTED NOT DETECTED Final   Shiga like toxin producing E coli (STEC) NOT DETECTED NOT DETECTED Final   Shigella/Enteroinvasive E coli (EIEC) NOT DETECTED NOT DETECTED Final   Cryptosporidium NOT DETECTED NOT DETECTED Final   Cyclospora cayetanensis NOT DETECTED NOT DETECTED Final   Entamoeba histolytica NOT DETECTED NOT DETECTED Final   Giardia lamblia NOT DETECTED NOT DETECTED Final   Adenovirus F40/41 NOT DETECTED NOT DETECTED Final   Astrovirus NOT DETECTED NOT DETECTED Final   Norovirus GI/GII NOT DETECTED NOT DETECTED Final   Rotavirus A NOT DETECTED NOT DETECTED Final   Sapovirus (I, II, IV, and V) NOT DETECTED NOT DETECTED Final     Labs: Basic Metabolic Panel:  Recent Labs Lab 06/03/17 1135 06/03/17 1156  06/03/17 2134 06/04/17 0609 06/04/17 1700 06/05/17 0826  NA 137 137 135 138  --  137  K 3.6 3.6 3.5 3.4*  --  2.8*  CL 101 102 106 107  --  104  CO2 18*  --  20* 21*  --  23  GLUCOSE 468* 486* 289* 210*  --  230*  BUN 14 15 8 11   --  14  CREATININE 0.87 0.50 0.62 0.70  --  0.65  CALCIUM 9.8  --  8.4* 8.8*  --  8.6*  MG 1.6*  --   --   --  2.1 1.8   Liver Function Tests:  Recent Labs Lab 06/03/17 1135 06/04/17 0609  AST 23 19  ALT 16 14  ALKPHOS 79 53  BILITOT 0.9 0.6  PROT 8.1 6.2*  ALBUMIN 4.8 3.8    Recent Labs Lab 06/03/17 1135  LIPASE 26   No results for input(s): AMMONIA in the last 168 hours. CBC:  Recent Labs Lab 06/03/17 1135 06/03/17 1156 06/04/17 0609  WBC 20.2*  --  16.4*  NEUTROABS 17.6*  --   --   HGB 16.5* 16.7* 14.1  HCT 45.9 49.0* 40.8  MCV 84.2  --  84.8  PLT 367  --  314    CBG:  Recent Labs Lab 06/04/17 1233 06/04/17 1710 06/04/17 2137 06/05/17 0807 06/05/17 1148  GLUCAP 168* 149* 171* 249* 155*     IMAGING STUDIES Dg Chest 2 View  Result Date: 06/03/2017 CLINICAL DATA:  Nausea and vomiting.  Central chest pain.  Cough. EXAM: CHEST  2 VIEW COMPARISON:  04/05/2017. FINDINGS: Normal sized heart. Clear lungs with normal vascularity. Stable mild diffuse peribronchial thickening and accentuation of the interstitial markings. Unremarkable bones. IMPRESSION: No acute abnormality.  Stable mild chronic bronchitic changes. Electronically Signed   By: Claudie Revering M.D.   On: 06/03/2017 13:15   Ct Abdomen Pelvis W Contrast  Result Date: 06/03/2017 CLINICAL DATA:  56 year old diabetic hypertensive female with lower abdominal pain, nausea, vomiting and diarrhea since early this morning. Initial encounter. EXAM: CT ABDOMEN AND PELVIS WITH CONTRAST TECHNIQUE: Multidetector CT imaging of the abdomen and pelvis was performed using the standard protocol following bolus administration of intravenous contrast. CONTRAST:  126mL ISOVUE-300 IOPAMIDOL  (ISOVUE-300) INJECTION 61% COMPARISON:  10/16/2013 and 09/18/2013 CT FINDINGS: Lower chest: No worrisome lung base abnormality. Hepatobiliary: Enlarged fatty liver spanning over 21.8 cm without worrisome hepatic lesion. Phrygian cap with progressive enhancement. Elective ultrasound recommended to determine if there is an underlying polypoid lesion. Pancreas: No pancreatic mass or inflammation. Spleen: No mass or enlargement. Adrenals/Urinary Tract: No hydronephrosis or obstructing stone. No renal or adrenal mass. Noncontrast filled views of the urinary bladder unremarkable. Stomach/Bowel: Majority of the colon is under distended limiting evaluation for detection of colitis. Scattered diverticula. No extraluminal bowel inflammatory process, free fluid or free air. Radiopaque material in the appendix may be related to contrast or stones. No CT evidence of inflammation of the appendix. Mild circumferential thickening distal esophagus unchanged from prior exams and may be normal for this patient. Vascular/Lymphatic: No aneurysm or large vessel occlusion. No adenopathy. Reproductive: Post hysterectomy.  No worrisome adnexal abnormality. Other: Periumbilical fat and vessel containing hernia. Musculoskeletal: Mild curvature lumbar spine convex right. No worrisome osseous abnormality. IMPRESSION: Majority of the colon is under distended. This gives the appearance of circumferential wall thickening and therefore limits evaluation for the possibility of mild colitis. There is however, no evidence of extraluminal bowel inflammatory process, free fluid or free air. No inflammation surrounds scattered diverticula or appendix. Enlarged fatty liver spanning over 21.8 cm. Phrygian cap with progressive enhancement. Elective ultrasound recommended to determine if there is an underlying polypoid lesion. Periumbilical fat and vessel containing hernia. Post hysterectomy. Electronically Signed   By: Genia Del M.D.   On: 06/03/2017  14:50    DISCHARGE EXAMINATION: Vitals:   06/04/17 2137 06/05/17 0528 06/05/17 0854 06/05/17 1415  BP: 105/76 134/82 (!) 150/91 (!) 147/82  Pulse: 73 75 77 70  Resp: 18 17  20   Temp: 98.2 F (36.8 C) 98.4 F (36.9 C)  98.2 F (36.8 C)  TempSrc: Oral Oral  Oral  SpO2: 99% 100%  100%  Weight:  57.6 kg (127 lb)    Height:       General appearance: alert, cooperative, appears stated age and no distress Resp: clear to auscultation bilaterally Cardio: regular rate and rhythm, S1, S2 normal, no murmur, click, rub or gallop GI: soft, non-tender; bowel sounds normal; no masses,  no organomegaly  DISPOSITION: Home  Discharge Instructions    Call MD for:  extreme fatigue    Complete by:  As directed    Call MD for:  persistant dizziness or light-headedness    Complete by:  As directed  Call MD for:  persistant nausea and vomiting    Complete by:  As directed    Call MD for:  severe uncontrolled pain    Complete by:  As directed    Call MD for:  temperature >100.4    Complete by:  As directed    Diet Carb Modified    Complete by:  As directed    Discharge instructions    Complete by:  As directed    Please be sure to follow up with your primary care provider in one week. Seek attention if symptoms recur. Take your medications as prescribed.  You were cared for by a hospitalist during your hospital stay. If you have any questions about your discharge medications or the care you received while you were in the hospital after you are discharged, you can call the unit and asked to speak with the hospitalist on call if the hospitalist that took care of you is not available. Once you are discharged, your primary care physician will handle any further medical issues. Please note that NO REFILLS for any discharge medications will be authorized once you are discharged, as it is imperative that you return to your primary care physician (or establish a relationship with a primary care physician  if you do not have one) for your aftercare needs so that they can reassess your need for medications and monitor your lab values. If you do not have a primary care physician, you can call [phone] for a physician referral.   Increase activity slowly    Complete by:  As directed       ALLERGIES:  Allergies  Allergen Reactions  . Codeine Hives, Anxiety and Other (See Comments)     Discharge Medication List as of 06/05/2017  3:16 PM    START taking these medications   Details  metoCLOPramide (REGLAN) 5 MG tablet Take 1 tablet (5 mg total) by mouth 4 (four) times daily -  before meals and at bedtime., Starting Wed 06/05/2017, Print    potassium chloride 20 MEQ TBCR Take 40 mEq by mouth daily., Starting Wed 06/05/2017, Until Mon 06/10/2017, Normal      CONTINUE these medications which have CHANGED   Details  ondansetron (ZOFRAN) 4 MG tablet Take 1 tablet (4 mg total) by mouth every 6 (six) hours as needed for nausea or vomiting., Starting Wed 06/05/2017, Normal      CONTINUE these medications which have NOT CHANGED   Details  aspirin 81 MG chewable tablet Chew 1 tablet (81 mg total) by mouth daily., Starting Tue 02/12/2017, Print    atorvastatin (LIPITOR) 80 MG tablet Take 1 tablet (80 mg total) by mouth daily at 6 PM., Starting Tue 09/04/2016, Normal    cetirizine (ZYRTEC) 10 MG tablet Take 1 tablet (10 mg total) by mouth daily. MUST SCHEDULE ANNUAL EXAM, Starting Mon 05/20/2017, Normal    Continuous Blood Gluc Receiver (FREESTYLE LIBRE READER) DEVI 1 Device by Does not apply route 3 (three) times daily., Starting Wed 04/03/2017, Normal    Continuous Blood Gluc Sensor (FREESTYLE LIBRE SENSOR SYSTEM) MISC 1 Device by Does not apply route every 30 (thirty) days., Starting Wed 04/03/2017, Normal    ezetimibe (ZETIA) 10 MG tablet Take 1 tablet (10 mg total) by mouth daily., Starting Tue 09/11/2016, Normal    Insulin Glargine (BASAGLAR KWIKPEN) 100 UNIT/ML SOPN Inject 0.45 mLs (45 Units total)  into the skin daily at 10 pm. INJECT 55 UNITS INTO THE SKIN DAILY AT 10 PM., Starting  Mon 04/08/2017, Normal    insulin lispro (HUMALOG KWIKPEN) 100 UNIT/ML KiwkPen Inject 0.08 mLs (8 Units total) into the skin 3 (three) times daily., Starting Mon 04/08/2017, Normal    levothyroxine (SYNTHROID, LEVOTHROID) 150 MCG tablet Take 1 tablet (150 mcg total) by mouth daily., Starting Wed 04/03/2017, Normal    losartan (COZAAR) 25 MG tablet Take 25 mg by mouth daily., Starting Mon 05/27/2017, Historical Med    metoprolol succinate (TOPROL-XL) 25 MG 24 hr tablet Take 25 mg by mouth daily., Starting Sun 03/24/2017, Historical Med    omeprazole (PRILOSEC) 40 MG capsule Take 1 capsule (40 mg total) by mouth 2 (two) times daily., Starting Tue 03/19/2017, Normal    RELPAX 40 MG tablet ONE TABLET BY MOUTH AT ONSET OF HEADACHE. MAY REPEAT IN 2 HOURS IF HEADACHE PERSISTS OR RECURS., Normal    sertraline (ZOLOFT) 50 MG tablet Take 1 tablet (50 mg total) by mouth daily., Starting Fri 05/31/2017, Normal      STOP taking these medications     pantoprazole (PROTONIX) 40 MG tablet          Follow-up Information    Bedsole, Amy E, MD. Schedule an appointment as soon as possible for a visit in 1 week(s).   Specialty:  Family Medicine Contact information: [place] [address] [phone]           TOTAL DISCHARGE TIME: 29 mins  [facility] Pager [phone]  06/05/2017, 4:51 PM

## 2017-06-05 NOTE — Progress Notes (Signed)
[name] [name] to be D/C'd Home per MD order.  Discussed with the patient and all questions fully answered.  VSS, Skin clean, dry and intact without evidence of skin break down, no evidence of skin tears noted. IV catheter discontinued intact. Site without signs and symptoms of complications. Dressing and pressure applied.  An After Visit Summary was printed and given to the patient. Patient received prescription.  D/c education completed with patient/family including follow up instructions, medication list, d/c activities limitations if indicated, with other d/c instructions as indicated by MD - patient able to verbalize understanding, all questions fully answered.   Patient instructed to return to ED, call 911, or call MD for any changes in condition.   Patient escorted out and D/C'd home via private auto.  Christoper Fabian [name] 06/05/2017 5:00 PM

## 2017-06-05 NOTE — Discharge Instructions (Signed)
Gastroparesis °Gastroparesis, also called delayed gastric emptying, is a condition in which food takes longer than normal to empty from the stomach. The condition is usually long-lasting (chronic). °What are the causes? °This condition may be caused by: °· An endocrine disorder, such as hypothyroidism or diabetes. Diabetes is the most common cause of this condition. °· A nervous system disease, such as Parkinson disease or multiple sclerosis. °· Cancer, infection, or surgery of the stomach or vagus nerve. °· A connective tissue disorder, such as scleroderma. °· Certain medicines. ° °In most cases, the cause is not known. °What increases the risk? °This condition is more likely to develop in: °· People with certain disorders, including endocrine disorders, eating disorders, amyloidosis, and scleroderma. °· People with certain diseases, including Parkinson disease or multiple sclerosis. °· People with cancer or infection of the stomach or vagus nerve. °· People who have had surgery on the stomach or vagus nerve. °· People who take certain medicines. °· Women. ° °What are the signs or symptoms? °Symptoms of this condition include: °· An early feeling of fullness when eating. °· Nausea. °· Weight loss. °· Vomiting. °· Heartburn. °· Abdominal bloating. °· Inconsistent blood glucose levels. °· Lack of appetite. °· Acid from the stomach coming up into the esophagus (gastroesophageal reflux). °· Spasms of the stomach. ° °Symptoms may come and go. °How is this diagnosed? °This condition is diagnosed with tests, such as: °· Tests that check how long it takes food to move through the stomach and intestines. These tests include: °? Upper gastrointestinal (GI) series. In this test, X-rays of the intestines are taken after you drink a liquid. The liquid makes the intestines show up better on the X-rays. °? Gastric emptying scintigraphy. In this test, scans are taken after you eat food that contains a small amount of radioactive  material. °? Wireless capsule GI monitoring system. This test involves swallowing a capsule that records information about movement through the stomach. °· Gastric manometry. This test measures electrical and muscular activity in the stomach. It is done with a thin tube that is passed down the throat and into the stomach. °· Endoscopy. This test checks for abnormalities in the lining of the stomach. It is done with a long, thin tube that is passed down the throat and into the stomach. °· An ultrasound. This test can help rule out gallbladder disease or pancreatitis as a cause of your symptoms. It uses sound waves to take pictures of the inside of your body. ° °How is this treated? °There is no cure for gastroparesis. This condition may be managed with: °· Treatment of the underlying condition causing the gastroparesis. °· Lifestyle changes, including exercise and dietary changes. Dietary changes can include: °? Changes in what and when you eat. °? Eating smaller meals more often. °? Eating low-fat foods. °? Eating low-fiber forms of high-fiber foods, such as cooked vegetables instead of raw vegetables. °? Having liquid foods in place of solid foods. Liquid foods are easier to digest. °· Medicines. These may be given to control nausea and vomiting and to stimulate stomach muscles. °· Getting food through a feeding tube. This may be done in severe cases. °· A gastric neurostimulator. This is a device that is inserted into the body with surgery. It helps improve stomach emptying and control nausea and vomiting. ° °Follow these instructions at home: °· Follow your health care provider's instructions about exercise and diet. °· Take medicines only as directed by your health care provider. °Contact a   health care provider if: °· Your symptoms do not improve with treatment. °· You have new symptoms. °Get help right away if: °· You have severe abdominal pain that does not improve with treatment. °· You have nausea that does  not go away. °· You cannot keep fluids down. °This information is not intended to replace advice given to you by your health care provider. Make sure you discuss any questions you have with your health care provider. °Document Released: 11/26/2005 Document Revised: 05/03/2016 Document Reviewed: 11/22/2014 °Elsevier Interactive Patient Education © 2018 [employer] ° °

## 2017-06-06 NOTE — Telephone Encounter (Signed)
It looks like this med was d/c in hospital, should pt still be on this med. Please advise

## 2017-06-06 NOTE — Telephone Encounter (Signed)
No.. Stopped at discharge.

## 2017-06-07 ENCOUNTER — Encounter: Payer: Self-pay | Admitting: Family Medicine

## 2017-06-07 ENCOUNTER — Ambulatory Visit (INDEPENDENT_AMBULATORY_CARE_PROVIDER_SITE_OTHER): Payer: 59 | Admitting: Family Medicine

## 2017-06-07 VITALS — BP 102/68 | HR 77 | Temp 98.2°F | Ht 61.0 in | Wt 127.5 lb

## 2017-06-07 DIAGNOSIS — E1110 Type 2 diabetes mellitus with ketoacidosis without coma: Secondary | ICD-10-CM | POA: Diagnosis not present

## 2017-06-07 DIAGNOSIS — Q441 Other congenital malformations of gallbladder: Secondary | ICD-10-CM | POA: Diagnosis not present

## 2017-06-07 DIAGNOSIS — E781 Pure hyperglyceridemia: Secondary | ICD-10-CM

## 2017-06-07 DIAGNOSIS — R1030 Lower abdominal pain, unspecified: Secondary | ICD-10-CM | POA: Diagnosis not present

## 2017-06-07 DIAGNOSIS — Z794 Long term (current) use of insulin: Secondary | ICD-10-CM | POA: Diagnosis not present

## 2017-06-07 NOTE — Progress Notes (Signed)
Subjective:    Patient ID: [name] [name], female    DOB: Apr 16, 1961, 56 y.o.   MRN: [id number]  HPI  56 year old female presents for follow up  Hospitalization for N/V/D   She has been hospitalized multiple times in last 6 months for DM complications. Most recent. No transition care call made after this admission. Admit date: 06/03/2017 Discharge date: 06/05/2017   Dx DM with ketoacidosis   DM gastroparesis.Buddy Duty on [place]:   Chronic drug abuse UDS positive formarijuana. She was counseled to stop using marijuana.  Diabetic Gastroparesis Her symptoms quickly resolved. CT scan did raise concern for colitis. However, diarrhea resolved. She was afebrile. Leukocytosis is most likely due to dehydration. She was taken off of antibiotics. She is feeling better today. Diet was advanced. Reglan was given intravenously. Change to oral today. Ambulated. Wishes to go home. Blood cultures negative  Phrygian cap  -Counseled patient that she did not have a lesion of the gallbladder but a folded portion of thegallbladder. -Recommended that patient get elective ultrasound for further evaluation.   DM type II uncontrolled with complications/Hyperglycemia  -4/27 Hemoglobin A1c=10.8 -Precipitated by missing insulin dose and possible infection(unlikely).  She may resume her home medication regimen.  Chronic systolic CHF -0/04/3975 Echocardiogram:LVEF= 35% to 40%. Hypokinesis of the mid-apicalanterior, lateral, and inferior myocardium. Continue home medications. She was euvolemic.  Essential hypertension Continue home medication  Hyperlipidemia Continue home statins. LDL 168. Needs better control. Needs outpatient follow-up  Generalized AnxiettyDisorder  Continue home Zoloft 50 mg daily  Hypothyroidism: Continue home Synthroid150g daily  TODAY: She feels like since starting the reglan..less abd pain, less nausea, no BM since discharge yet.Marland Kitchen  Has not needed zofran since  No longer on protonix. FBS this AM 188   no more [place]!!!  Has follow up  With ENDO 07/19/2017   Wt Readings from Last 3 Encounters:  06/07/17 127 lb 8 oz (57.8 kg)  06/05/17 127 lb (57.6 kg)  04/05/17 119 lb 7.8 oz (54.2 kg)    Cholesterol still high on  Zetia.. She may not be taking lipitor. She is not sure LDL 168 in hospital.  Review of Systems  Constitutional: Negative for fatigue and fever.  HENT: Negative for ear pain.   Eyes: Negative for pain.  Respiratory: Negative for chest tightness and shortness of breath.   Cardiovascular: Negative for chest pain, palpitations and leg swelling.  Gastrointestinal: Positive for abdominal distention and abdominal pain.  Genitourinary: Negative for dysuria.       Objective:   Physical Exam  Constitutional: Vital signs are normal. She appears well-developed and well-nourished. She is cooperative.  Non-toxic appearance. She does not appear ill. No distress.  overweight  HENT:  Head: Normocephalic.  Right Ear: Hearing, tympanic membrane, external ear and ear canal normal. Tympanic membrane is not erythematous, not retracted and not bulging.  Left Ear: Hearing, tympanic membrane, external ear and ear canal normal. Tympanic membrane is not erythematous, not retracted and not bulging.  Nose: No mucosal edema or rhinorrhea. Right sinus exhibits no maxillary sinus tenderness and no frontal sinus tenderness. Left sinus exhibits no maxillary sinus tenderness and no frontal sinus tenderness.  Mouth/Throat: Uvula is midline, oropharynx is clear and moist and mucous membranes are normal.  Eyes: Pupils are equal, round, and reactive to light. Conjunctivae, EOM and lids are normal. Lids are everted and swept, no foreign bodies found.  Neck: Trachea normal and normal range of motion. Neck supple. Carotid  bruit is not present. No thyroid mass and no thyromegaly present.  Cardiovascular: Normal rate, regular rhythm, S1  normal, S2 normal, normal heart sounds, intact distal pulses and normal pulses.  Exam reveals no gallop and no friction rub.   No murmur heard. Pulmonary/Chest: Effort normal and breath sounds normal. No tachypnea. No respiratory distress. She has no decreased breath sounds. She has no wheezes. She has no rhonchi. She has no rales.  Abdominal: Soft. Normal appearance and bowel sounds are normal. There is no hepatosplenomegaly. There is generalized tenderness. There is no rigidity, no rebound, no guarding and no CVA tenderness.  Neurological: She is alert.  Skin: Skin is warm, dry and intact. No rash noted.  Psychiatric: Her speech is normal and behavior is normal. Judgment and thought content normal. Her mood appears not anxious. Cognition and memory are normal. She does not exhibit a depressed mood.          Assessment & Plan:

## 2017-06-07 NOTE — Patient Instructions (Addendum)
Please stop at the front desk to set up referral. Make sure you are taking BOTH the zetia and lipitor daily.

## 2017-06-08 LAB — CULTURE, BLOOD (ROUTINE X 2)
CULTURE: NO GROWTH
Culture: NO GROWTH
SPECIAL REQUESTS: ADEQUATE
SPECIAL REQUESTS: ADEQUATE

## 2017-06-13 ENCOUNTER — Telehealth: Payer: Self-pay

## 2017-06-13 NOTE — Telephone Encounter (Signed)
[facility] said that it takes days for the freestyle libre to calibrate and [facility] received from pt a hand written rx for freestyle Libre test strips with no directions signed by Dr Diona Browner. [facility] said that [facility] does not have test strips; only a reader and sensor. [facility] said should be Freestyle Precision Neo test strips but will need new rx for strips with directions. The 06/07/17 office note is not finished. I put the precision Neo test strips on med list and will send note to Dr Diona Browner.Please advise.

## 2017-06-14 MED ORDER — GLUCOSE BLOOD VI STRP: ORAL_STRIP | 3 refills | Status: DC

## 2017-06-14 NOTE — Telephone Encounter (Signed)
Okay to send in rx for different strips. Pt just wanted ability to check blood sugar  separately from cutaneous meter.  Send in  With instructions to check blood sugar daily as needed #100, 0 RF.

## 2017-06-14 NOTE — Telephone Encounter (Signed)
Freestyle Precision Neo Test Strip [facility] as instructed by Dr. Diona Browner.

## 2017-06-17 ENCOUNTER — Other Ambulatory Visit: Payer: Self-pay | Admitting: *Deleted

## 2017-06-17 MED ORDER — GLUCOSE BLOOD VI STRP: ORAL_STRIP | 3 refills | Status: DC

## 2017-06-17 NOTE — Telephone Encounter (Signed)
[facility] states patient's insurance does not cover the Freestyle Precision Neo Test Strips but patient still has a OneTouch Verio Meter and would like refill on test strips for that.  Refills sent as requested.

## 2017-06-17 NOTE — Telephone Encounter (Signed)
Patient called again today wanting to verify that we faxed in the correct order for her testing strips.  Patient states that her insurance doesn't cover the strips for her new meter and she is wanting the old ones now.  I LM on her VM (ok per DPR) for patient to please call us back and clarify the name of the meter that she is wanting strips for.  We did send in orders for a Freestyle Neo strip on Friday.  If she had pharmacy fax [place] a request since then we will need to look.  Otherwise, please state specifically which strips she needs.  Thanks.

## 2017-06-18 ENCOUNTER — Ambulatory Visit
Admission: RE | Admit: 2017-06-18 | Discharge: 2017-06-18 | Disposition: A | Discharge status: Home / Self Care | Payer: 59 | Source: Ambulatory Visit | Attending: Family Medicine | Admitting: Family Medicine

## 2017-06-18 DIAGNOSIS — Q441 Other congenital malformations of gallbladder: Secondary | ICD-10-CM

## 2017-06-18 NOTE — Telephone Encounter (Signed)
Pt called back and was advised per refill note on 06/17/17; pt will wait to hear from pharmacy about when refill is ready for pick up. Nothing further needed at this time.

## 2017-06-20 DIAGNOSIS — Q441 Other congenital malformations of gallbladder: Secondary | ICD-10-CM | POA: Insufficient documentation

## 2017-06-20 NOTE — Assessment & Plan Note (Signed)
Poor control. She is not monitoring with freestyle libre. Requests test strips for additional testing.  Currently compliant with medication and encouraged to continue.

## 2017-06-20 NOTE — Assessment & Plan Note (Addendum)
Verify or start taking statin medication as directed. Encouraged exercise, weight loss, healthy eating habits.  Continue zetia as well.

## 2017-06-20 NOTE — Assessment & Plan Note (Signed)
Eval with [place] to assess if true finding as seen on CT.

## 2017-06-20 NOTE — Assessment & Plan Note (Signed)
Liekly due in part to DM gastroparesis.. She has noted some improvement in symptoms from reglan. Conitnue.

## 2017-06-22 ENCOUNTER — Encounter (HOSPITAL_COMMUNITY): Payer: Self-pay | Admitting: Emergency Medicine

## 2017-06-22 ENCOUNTER — Emergency Department (HOSPITAL_COMMUNITY): Payer: 59

## 2017-06-22 ENCOUNTER — Observation Stay (HOSPITAL_COMMUNITY)
Admission: EM | Admit: 2017-06-22 | Discharge: 2017-06-24 | Disposition: A | Discharge status: Home / Self Care | Payer: 59 | Attending: Internal Medicine | Admitting: Internal Medicine

## 2017-06-22 DIAGNOSIS — E1022 Type 1 diabetes mellitus with diabetic chronic kidney disease: Secondary | ICD-10-CM | POA: Insufficient documentation

## 2017-06-22 DIAGNOSIS — E1042 Type 1 diabetes mellitus with diabetic polyneuropathy: Secondary | ICD-10-CM | POA: Diagnosis not present

## 2017-06-22 DIAGNOSIS — I252 Old myocardial infarction: Secondary | ICD-10-CM | POA: Insufficient documentation

## 2017-06-22 DIAGNOSIS — I129 Hypertensive chronic kidney disease with stage 1 through stage 4 chronic kidney disease, or unspecified chronic kidney disease: Secondary | ICD-10-CM | POA: Insufficient documentation

## 2017-06-22 DIAGNOSIS — E876 Hypokalemia: Secondary | ICD-10-CM | POA: Diagnosis not present

## 2017-06-22 DIAGNOSIS — E039 Hypothyroidism, unspecified: Secondary | ICD-10-CM | POA: Diagnosis not present

## 2017-06-22 DIAGNOSIS — N182 Chronic kidney disease, stage 2 (mild): Secondary | ICD-10-CM | POA: Diagnosis present

## 2017-06-22 DIAGNOSIS — I255 Ischemic cardiomyopathy: Secondary | ICD-10-CM | POA: Diagnosis not present

## 2017-06-22 DIAGNOSIS — M810 Age-related osteoporosis without current pathological fracture: Secondary | ICD-10-CM | POA: Diagnosis not present

## 2017-06-22 DIAGNOSIS — F329 Major depressive disorder, single episode, unspecified: Secondary | ICD-10-CM | POA: Diagnosis not present

## 2017-06-22 DIAGNOSIS — E1010 Type 1 diabetes mellitus with ketoacidosis without coma: Secondary | ICD-10-CM | POA: Diagnosis not present

## 2017-06-22 DIAGNOSIS — Z87891 Personal history of nicotine dependence: Secondary | ICD-10-CM | POA: Insufficient documentation

## 2017-06-22 DIAGNOSIS — Z794 Long term (current) use of insulin: Secondary | ICD-10-CM | POA: Diagnosis not present

## 2017-06-22 DIAGNOSIS — K3184 Gastroparesis: Secondary | ICD-10-CM

## 2017-06-22 DIAGNOSIS — I2510 Atherosclerotic heart disease of native coronary artery without angina pectoris: Secondary | ICD-10-CM | POA: Diagnosis not present

## 2017-06-22 DIAGNOSIS — E860 Dehydration: Secondary | ICD-10-CM | POA: Insufficient documentation

## 2017-06-22 DIAGNOSIS — Z79899 Other long term (current) drug therapy: Secondary | ICD-10-CM | POA: Diagnosis not present

## 2017-06-22 DIAGNOSIS — E1043 Type 1 diabetes mellitus with diabetic autonomic (poly)neuropathy: Secondary | ICD-10-CM | POA: Diagnosis not present

## 2017-06-22 DIAGNOSIS — E1110 Type 2 diabetes mellitus with ketoacidosis without coma: Secondary | ICD-10-CM | POA: Diagnosis not present

## 2017-06-22 DIAGNOSIS — K580 Irritable bowel syndrome with diarrhea: Secondary | ICD-10-CM | POA: Insufficient documentation

## 2017-06-22 DIAGNOSIS — E1143 Type 2 diabetes mellitus with diabetic autonomic (poly)neuropathy: Secondary | ICD-10-CM | POA: Diagnosis present

## 2017-06-22 DIAGNOSIS — I4891 Unspecified atrial fibrillation: Secondary | ICD-10-CM | POA: Insufficient documentation

## 2017-06-22 DIAGNOSIS — F411 Generalized anxiety disorder: Secondary | ICD-10-CM | POA: Diagnosis present

## 2017-06-22 DIAGNOSIS — Z7982 Long term (current) use of aspirin: Secondary | ICD-10-CM | POA: Insufficient documentation

## 2017-06-22 DIAGNOSIS — E785 Hyperlipidemia, unspecified: Secondary | ICD-10-CM | POA: Insufficient documentation

## 2017-06-22 LAB — GLUCOSE, CAPILLARY
GLUCOSE-CAPILLARY: 190 mg/dL — AB (ref 65–99)
GLUCOSE-CAPILLARY: 201 mg/dL — AB (ref 65–99)
GLUCOSE-CAPILLARY: 220 mg/dL — AB (ref 65–99)
GLUCOSE-CAPILLARY: 79 mg/dL (ref 65–99)
Glucose-Capillary: 160 mg/dL — ABNORMAL HIGH (ref 65–99)
Glucose-Capillary: 203 mg/dL — ABNORMAL HIGH (ref 65–99)

## 2017-06-22 LAB — URINALYSIS, ROUTINE W REFLEX MICROSCOPIC
BILIRUBIN URINE: NEGATIVE
Bacteria, UA: NONE SEEN
Glucose, UA: >=500 mg/dL — AB
HGB URINE DIPSTICK: NEGATIVE
Ketones, ur: 80 mg/dL — AB
Leukocytes, UA: NEGATIVE
NITRITE: NEGATIVE
Protein, ur: 100 mg/dL — AB
SPECIFIC GRAVITY, URINE: 1.023 (ref 1.005–1.030)
pH: 6 (ref 5.0–8.0)

## 2017-06-22 LAB — BASIC METABOLIC PANEL
Anion gap: 7 (ref 5–15)
BUN: 9 mg/dL (ref 6–20)
CALCIUM: 8.3 mg/dL — AB (ref 8.9–10.3)
CHLORIDE: 101 mmol/L (ref 101–111)
CO2: 28 mmol/L (ref 22–32)
CREATININE: 0.52 mg/dL (ref 0.44–1.00)
Glucose, Bld: 157 mg/dL — ABNORMAL HIGH (ref 65–99)
Potassium: 2.4 mmol/L — CL (ref 3.5–5.1)
SODIUM: 136 mmol/L (ref 135–145)

## 2017-06-22 LAB — COMPREHENSIVE METABOLIC PANEL
ALT: 16 U/L (ref 14–54)
ANION GAP: 20 — AB (ref 5–15)
AST: 24 U/L (ref 15–41)
Albumin: 4.3 g/dL (ref 3.5–5.0)
Alkaline Phosphatase: 64 U/L (ref 38–126)
BILIRUBIN TOTAL: 0.6 mg/dL (ref 0.3–1.2)
BUN: 13 mg/dL (ref 6–20)
CALCIUM: 9.3 mg/dL (ref 8.9–10.3)
CO2: 19 mmol/L — ABNORMAL LOW (ref 22–32)
Chloride: 98 mmol/L — ABNORMAL LOW (ref 101–111)
Creatinine, Ser: 0.82 mg/dL (ref 0.44–1.00)
GLUCOSE: 412 mg/dL — AB (ref 65–99)
Potassium: 3.6 mmol/L (ref 3.5–5.1)
Sodium: 137 mmol/L (ref 135–145)
TOTAL PROTEIN: 7.6 g/dL (ref 6.5–8.1)

## 2017-06-22 LAB — CBG MONITORING, ED
Glucose-Capillary: 422 mg/dL — ABNORMAL HIGH (ref 65–99)
Glucose-Capillary: 274 mg/dL — ABNORMAL HIGH (ref 65–99)
Glucose-Capillary: 246 mg/dL — ABNORMAL HIGH (ref 65–99)

## 2017-06-22 LAB — CBC
HCT: 41.8 % (ref 36.0–46.0)
Hemoglobin: 14.9 g/dL (ref 12.0–15.0)
MCH: 29.9 pg (ref 26.0–34.0)
MCHC: 35.6 g/dL (ref 30.0–36.0)
MCV: 83.9 fL (ref 78.0–100.0)
Platelets: 324 10*3/uL (ref 150–400)
RBC: 4.98 MIL/uL (ref 3.87–5.11)
RDW: 14.7 % (ref 11.5–15.5)
WBC: 14.9 10*3/uL — AB (ref 4.0–10.5)

## 2017-06-22 LAB — LIPASE, BLOOD: Lipase: 18 U/L (ref 11–51)

## 2017-06-22 LAB — MAGNESIUM
MAGNESIUM: 1.6 mg/dL — AB (ref 1.7–2.4)
MAGNESIUM: 1.6 mg/dL — AB (ref 1.7–2.4)

## 2017-06-22 LAB — POC URINE PREG, ED: Preg Test, Ur: NEGATIVE

## 2017-06-22 MED ORDER — ASPIRIN 81 MG PO CHEW
81.0000 mg | CHEWABLE_TABLET | Freq: Every day | ORAL | Status: DC
  Administered 2017-06-23 – 2017-06-24 (×2): 81 mg via ORAL
  Filled 2017-06-22 (×2): qty 1

## 2017-06-22 MED ORDER — EZETIMIBE 10 MG PO TABS
10.0000 mg | ORAL_TABLET | Freq: Every day | ORAL | Status: DC
  Administered 2017-06-23 – 2017-06-24 (×2): 10 mg via ORAL
  Filled 2017-06-22 (×4): qty 1

## 2017-06-22 MED ORDER — METOCLOPRAMIDE HCL 5 MG/ML IJ SOLN
10.0000 mg | Freq: Three times a day (TID) | INTRAMUSCULAR | Status: DC
  Administered 2017-06-22 – 2017-06-23 (×2): 10 mg via INTRAVENOUS
  Filled 2017-06-22 (×3): qty 2

## 2017-06-22 MED ORDER — ONDANSETRON HCL 4 MG/2ML IJ SOLN
4.0000 mg | Freq: Four times a day (QID) | INTRAMUSCULAR | Status: DC
  Administered 2017-06-23 – 2017-06-24 (×6): 4 mg via INTRAVENOUS
  Filled 2017-06-22 (×6): qty 2

## 2017-06-22 MED ORDER — PROMETHAZINE HCL 25 MG/ML IJ SOLN
12.5000 mg | Freq: Once | INTRAMUSCULAR | Status: AC
Start: 2017-06-22 — End: 2017-06-22
  Administered 2017-06-22: 12.5 mg via INTRAVENOUS
  Filled 2017-06-22: qty 1

## 2017-06-22 MED ORDER — SODIUM CHLORIDE 0.9 % IV SOLN
INTRAVENOUS | Status: DC
  Administered 2017-06-22: 1.4 [IU]/h via INTRAVENOUS
  Filled 2017-06-22: qty 1

## 2017-06-22 MED ORDER — INSULIN REGULAR BOLUS VIA INFUSION
0.0000 [IU] | Freq: Three times a day (TID) | INTRAVENOUS | Status: DC
  Filled 2017-06-22: qty 10

## 2017-06-22 MED ORDER — FENTANYL CITRATE (PF) 100 MCG/2ML IJ SOLN
50.0000 ug | Freq: Once | INTRAMUSCULAR | Status: AC
  Administered 2017-06-22: 50 ug via INTRAVENOUS
  Filled 2017-06-22: qty 2

## 2017-06-22 MED ORDER — SODIUM CHLORIDE 0.9 % IV BOLUS (SEPSIS)
1000.0000 mL | Freq: Once | INTRAVENOUS | Status: AC
  Administered 2017-06-22: 1000 mL via INTRAVENOUS

## 2017-06-22 MED ORDER — METOCLOPRAMIDE HCL 5 MG/ML IJ SOLN
10.0000 mg | Freq: Once | INTRAMUSCULAR | Status: AC
  Administered 2017-06-22: 10 mg via INTRAVENOUS
  Filled 2017-06-22: qty 2

## 2017-06-22 MED ORDER — LOSARTAN POTASSIUM 50 MG PO TABS
25.0000 mg | ORAL_TABLET | Freq: Every day | ORAL | Status: DC
  Administered 2017-06-23 – 2017-06-24 (×2): 25 mg via ORAL
  Filled 2017-06-22 (×4): qty 1

## 2017-06-22 MED ORDER — ONDANSETRON HCL 4 MG/2ML IJ SOLN
4.0000 mg | Freq: Four times a day (QID) | INTRAMUSCULAR | Status: DC | PRN
  Administered 2017-06-22 – 2017-06-24 (×3): 4 mg via INTRAVENOUS
  Filled 2017-06-22 (×3): qty 2

## 2017-06-22 MED ORDER — DEXTROSE 50 % IV SOLN: 25.0000 mL | INTRAVENOUS | Status: DC | PRN

## 2017-06-22 MED ORDER — DEXTROSE-NACL 5-0.45 % IV SOLN
INTRAVENOUS | Status: DC
  Administered 2017-06-22: 1000 mL via INTRAVENOUS
  Administered 2017-06-23: 01:00:00 via INTRAVENOUS

## 2017-06-22 MED ORDER — ONDANSETRON HCL 4 MG/2ML IJ SOLN
4.0000 mg | Freq: Once | INTRAMUSCULAR | Status: AC
  Administered 2017-06-22: 4 mg via INTRAVENOUS
  Filled 2017-06-22: qty 2

## 2017-06-22 MED ORDER — SODIUM CHLORIDE 0.9 % IV SOLN
INTRAVENOUS | Status: DC
  Administered 2017-06-23: 10:00:00 via INTRAVENOUS
  Administered 2017-06-24: 1000 mL via INTRAVENOUS
  Administered 2017-06-24: 75 mL via INTRAVENOUS

## 2017-06-22 MED ORDER — MAGNESIUM SULFATE 2 GM/50ML IV SOLN
2.0000 g | Freq: Once | INTRAVENOUS | Status: AC
  Administered 2017-06-22: 2 g via INTRAVENOUS
  Filled 2017-06-22: qty 50

## 2017-06-22 MED ORDER — ENOXAPARIN SODIUM 40 MG/0.4ML ~~LOC~~ SOLN
40.0000 mg | SUBCUTANEOUS | Status: DC
  Administered 2017-06-22 – 2017-06-23 (×2): 40 mg via SUBCUTANEOUS
  Filled 2017-06-22: qty 0.4

## 2017-06-22 MED ORDER — ONDANSETRON HCL 4 MG PO TABS
4.0000 mg | ORAL_TABLET | Freq: Four times a day (QID) | ORAL | Status: DC | PRN
Start: 2017-06-22 — End: 2017-06-24

## 2017-06-22 MED ORDER — INSULIN ASPART 100 UNIT/ML ~~LOC~~ SOLN
0.0000 [IU] | SUBCUTANEOUS | Status: DC
  Administered 2017-06-22: 8 [IU] via SUBCUTANEOUS
  Filled 2017-06-22: qty 1

## 2017-06-22 MED ORDER — METOPROLOL SUCCINATE ER 25 MG PO TB24
25.0000 mg | ORAL_TABLET | Freq: Every day | ORAL | Status: DC
  Administered 2017-06-23 – 2017-06-24 (×2): 25 mg via ORAL
  Filled 2017-06-22 (×3): qty 1

## 2017-06-22 MED ORDER — ATORVASTATIN CALCIUM 40 MG PO TABS
80.0000 mg | ORAL_TABLET | Freq: Every day | ORAL | Status: DC
  Filled 2017-06-22: qty 2

## 2017-06-22 MED ORDER — SERTRALINE HCL 50 MG PO TABS
50.0000 mg | ORAL_TABLET | Freq: Every day | ORAL | Status: DC
  Administered 2017-06-23 – 2017-06-24 (×2): 50 mg via ORAL
  Filled 2017-06-22 (×3): qty 1

## 2017-06-22 MED ORDER — INSULIN ASPART 100 UNIT/ML ~~LOC~~ SOLN: 4.0000 [IU] | Freq: Three times a day (TID) | SUBCUTANEOUS | Status: DC

## 2017-06-22 MED ORDER — LEVOTHYROXINE SODIUM 50 MCG PO TABS
150.0000 ug | ORAL_TABLET | Freq: Every day | ORAL | Status: DC
  Administered 2017-06-23 – 2017-06-24 (×2): 150 ug via ORAL
  Filled 2017-06-22 (×2): qty 2

## 2017-06-22 NOTE — ED Notes (Signed)
Bed: [facility] Expected date:  Expected time:  Means of arrival:  Comments: 

## 2017-06-22 NOTE — ED Provider Notes (Addendum)
[facility] DEPT Provider Note   CSN: [id number] Arrival date & time: 06/22/17  0731     History   Chief Complaint Chief Complaint  Patient presents with  . Emesis    HPI [name] [name] is a 56 y.o. female.  Patient with a known history of diabetes and gastroparesis presents with 20 episodes of vomiting since yesterday. She is taking Zofran at home with minimal relief. EMS reports a glucose of 421. She feels dehydrated and weak. Severity of symptoms is moderate to severe.      Past Medical History:  Diagnosis Date  . Anxiety   . B12 deficiency   . Chest pain 04/05/2017  . GERD (gastroesophageal reflux disease)   . Hyperlipidemia   . Hypertension    [name]  . Hypothyroidism   . IBS (irritable bowel syndrome)   . Intermittent vertigo   . 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 Leg cramping    "@ night" (09/18/2013)  . Migraine    "once q couple months" (09/18/2013)  . Osteoporosis   . Peripheral neuropathy   . Type II diabetes mellitus ([place])   . Umbilical hernia    unrepaired (09/18/2013)    Patient Active Problem List   Diagnosis Date Noted  . Phrygian cap 06/20/2017  . Lactic acidosis   . Leukocytosis   . Lower abdominal pain   . Nonischemic cardiomyopathy ([place]) 03/22/2017  . NSTEMI (non-ST elevated myocardial infarction) ([place]) 02/11/2017  . H. pylori infection 02/09/2017  . Diabetic gastroparesis ([place]) 01/22/2017  . Type 2 diabetes mellitus with ketoacidosis without coma, with long-term current use of insulin ([place]) 07/17/2016  . Atrial fibrillation ([place]) 02/01/2015  . 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 Hepatic lesion 09/18/2013  . Vitamin D deficiency 05/20/2012  . Essential hypertension 02/01/2011  . Osteoporosis 02/01/2011  . B12 deficiency 04/04/2010  . Peripheral neuropathy ([place]) 05/10/2009  . GERD 04/08/2009  . Generalized anxiety disorder 06/22/2008  . Hypothyroidism, acquired  04/29/2008  . Hyperlipidemia 04/29/2008  . Irritable bowel syndrome 04/29/2008    Past Surgical History:  Procedure Laterality Date  . CESAREAN SECTION  1989  . LAPAROSCOPIC ASSISTED VAGINAL HYSTERECTOMY  09/23/2012   Procedure: LAPAROSCOPIC ASSISTED VAGINAL HYSTERECTOMY;  Surgeon: Linda Hedges, DO;  Location: [facility];  Service: Gynecology;  Laterality: N/A;  pull Dr Gregor Hams instrument  . LEFT HEART CATH AND CORONARY ANGIOGRAPHY N/A 02/11/2017   Procedure: Left Heart Cath and Coronary Angiography;  Surgeon: Lorretta Harp, MD;  Location: [facility];  Service: Cardiovascular;  Laterality: N/A;  . TUBAL LIGATION Bilateral 1992  . VAGINAL HYSTERECTOMY  2013    OB History    Gravida Para Term Preterm AB Living   2 2           SAB TAB Ectopic Multiple Live Births                   Home Medications    Prior to Admission medications   Medication Sig Start Date End Date Taking? Authorizing Provider  aspirin 81 MG chewable tablet Chew 1 tablet (81 mg total) by mouth daily. 02/12/17  Yes Jani Gravel, MD  atorvastatin (LIPITOR) 80 MG tablet Take 1 tablet (80 mg total) by mouth daily at 6 PM. 09/04/16  Yes Bedsole, Amy E, MD  cetirizine (ZYRTEC) 10 MG tablet Take 1 tablet (10 mg total) by mouth daily. MUST SCHEDULE ANNUAL EXAM 05/20/17  Yes Webb Silversmith  W, NP  ezetimibe (ZETIA) 10 MG tablet Take 1 tablet (10 mg total) by mouth daily. 09/11/16  Yes Bedsole, Amy E, MD  Insulin Glargine (BASAGLAR KWIKPEN) 100 UNIT/ML SOPN Inject 0.45 mLs (45 Units total) into the skin daily at 10 pm. INJECT 55 UNITS INTO THE SKIN DAILY AT 10 PM. 04/08/17  Yes Reyne Dumas, MD  insulin lispro (HUMALOG KWIKPEN) 100 UNIT/ML KiwkPen Inject 0.08 mLs (8 Units total) into the skin 3 (three) times daily. Patient taking differently: Inject 12 Units into the skin 3 (three) times daily.  04/08/17  Yes Reyne Dumas, MD  levothyroxine (SYNTHROID, LEVOTHROID) 150 MCG tablet Take 1 tablet (150 mcg total) by mouth daily.  04/03/17  Yes Philemon Kingdom, MD  losartan (COZAAR) 25 MG tablet Take 25 mg by mouth daily. 05/27/17  Yes [provider]  metoCLOPramide (REGLAN) 5 MG tablet Take 1 tablet (5 mg total) by mouth 4 (four) times daily -  before meals and at bedtime. 06/05/17  Yes Bonnielee Haff, MD  metoprolol succinate (TOPROL-XL) 25 MG 24 hr tablet Take 25 mg by mouth daily. 03/24/17  Yes [provider]  ondansetron (ZOFRAN) 4 MG tablet Take 1 tablet (4 mg total) by mouth every 6 (six) hours as needed for nausea or vomiting. 06/05/17  Yes Bonnielee Haff, MD  RELPAX 40 MG tablet ONE TABLET BY MOUTH AT ONSET OF HEADACHE. MAY REPEAT IN 2 HOURS IF HEADACHE PERSISTS OR RECURS. 10/17/16  Yes Bedsole, Amy E, MD  sertraline (ZOLOFT) 50 MG tablet Take 1 tablet (50 mg total) by mouth daily. 05/31/17  Yes Bedsole, Amy E, MD  Continuous Blood Gluc Receiver (FREESTYLE LIBRE READER) DEVI 1 Device by Does not apply route 3 (three) times daily. 04/03/17   Philemon Kingdom, MD  Continuous Blood Gluc Sensor (FREESTYLE LIBRE SENSOR SYSTEM) MISC 1 Device by Does not apply route every 30 (thirty) days. 04/03/17   Philemon Kingdom, MD  glucose blood (ONETOUCH VERIO) test strip Use to check blood sugar daily as needed.  Dx: E11.10 06/17/17   Jinny Sanders, MD    Family History Family History  Problem Relation Age of Onset  . Diabetes Other   . Heart disease Other   . Heart failure Mother   . Diabetes Maternal Grandmother   . Alzheimer's disease Maternal Grandmother   . Coronary artery disease Maternal Grandmother   . Heart attack Maternal Grandfather   . Heart failure Brother     Social History Social History  Substance Use Topics  . Smoking status: Former Smoker    Years: 0.20    Types: Cigarettes    Quit date: 12/10/1990  . Smokeless tobacco: Never Used  . Alcohol use No     Allergies   Codeine   Review of Systems Review of Systems  All other systems reviewed and are negative.    Physical  Exam Updated Vital Signs BP (!) 150/86   Pulse (!) 105   Temp 97.6 F (36.4 C) (Oral)   Resp 16   LMP 08/11/2012   SpO2 100%   Physical Exam  Constitutional: She is oriented to person, place, and time.  Appears dehydrated  HENT:  Head: Normocephalic and atraumatic.  Eyes: Conjunctivae are normal.  Neck: Neck supple.  Cardiovascular: Normal rate and regular rhythm.   Pulmonary/Chest: Effort normal and breath sounds normal.  Abdominal: Soft. Bowel sounds are normal.  Musculoskeletal: Normal range of motion.  Neurological: She is alert and oriented to person, place, and time.  Skin:  Skin is warm and dry.  Psychiatric: She has a normal mood and affect. Her behavior is normal.  Nursing note and vitals reviewed.    ED Treatments / Results  Labs (all labs ordered are listed, but only abnormal results are displayed) Labs Reviewed  COMPREHENSIVE METABOLIC PANEL - Abnormal; Notable for the following:       Result Value   Chloride 98 (*)    CO2 19 (*)    Glucose, Bld 412 (*)    Anion gap 20 (*)    All other components within normal limits  CBC - Abnormal; Notable for the following:    WBC 14.9 (*)    All other components within normal limits  URINALYSIS, ROUTINE W REFLEX MICROSCOPIC - Abnormal; Notable for the following:    Color, Urine STRAW (*)    Glucose, UA >=500 (*)    Ketones, ur 80 (*)    Protein, ur 100 (*)    Squamous Epithelial / LPF 0-5 (*)    All other components within normal limits  CBG MONITORING, ED - Abnormal; Notable for the following:    Glucose-Capillary 422 (*)    All other components within normal limits  CBG MONITORING, ED - Abnormal; Notable for the following:    Glucose-Capillary 274 (*)    All other components within normal limits  LIPASE, BLOOD  POC URINE PREG, ED    EKG  EKG Interpretation None       Radiology No results found.  Procedures Procedures (including critical care time)  Medications Ordered in ED Medications   insulin aspart (novoLOG) injection 0-24 Units (8 Units Subcutaneous Given 06/22/17 1504)  insulin aspart (novoLOG) injection 4 Units (not administered)  ondansetron (ZOFRAN) injection 4 mg (4 mg Intravenous Given 06/22/17 0852)  sodium chloride 0.9 % bolus 1,000 mL (0 mLs Intravenous Stopped 06/22/17 1213)  metoCLOPramide (REGLAN) injection 10 mg (10 mg Intravenous Given 06/22/17 0852)  fentaNYL (SUBLIMAZE) injection 50 mcg (50 mcg Intravenous Given 06/22/17 0853)  sodium chloride 0.9 % bolus 1,000 mL (0 mLs Intravenous Stopped 06/22/17 1109)  ondansetron (ZOFRAN) injection 4 mg (4 mg Intravenous Given 06/22/17 1236)  sodium chloride 0.9 % bolus 1,000 mL (1,000 mLs Intravenous New Bag/Given 06/22/17 1236)  promethazine (PHENERGAN) injection 12.5 mg (12.5 mg Intravenous Given 06/22/17 1502)     Initial Impression / Assessment and Plan / ED Course  I have reviewed the triage vital signs and the nursing notes.  Pertinent labs & imaging results that were available during my care of the patient were reviewed by me and considered in my medical decision making (see chart for details).    CRITICAL CARE Performed by: Nat Christen  ?  Total critical care time: 30 minutes  Critical care time was exclusive of separately billable procedures and treating other patients.  Critical care was necessary to treat or prevent imminent or life-threatening deterioration.  Critical care was time spent personally by me on the following activities: development of treatment plan with patient and/or surrogate as well as nursing, discussions with consultants, evaluation of patient's response to treatment, examination of patient, obtaining history from patient or surrogate, ordering and performing treatments and interventions, ordering and review of laboratory studies, ordering and review of radiographic studies, pulse oximetry and re-evaluation of patient's condition.  Patient appears dehydrated. She has a positive anion  gap and is likely in mild DKA. Will hydrate and start the glucose stabilizer protocol. Admit.  Final Clinical Impressions(s) / ED Diagnoses   Final diagnoses:  Diabetic  ketoacidosis without coma associated with type 1 diabetes mellitus ([place])  Gastroparesis    New Prescriptions New Prescriptions   No medications on file     Nat Christen, MD 06/22/17 [place]    Nat Christen, MD 06/22/17 1537

## 2017-06-22 NOTE — ED Triage Notes (Signed)
Per EMS, pt with hx of DM and gastroparesis and chronic nausea and emesis c/o nausea and emesis onset last night, reports 20 episodes of emesis since. Takes Zofran at home without relief. CBG 421 with EMS.

## 2017-06-22 NOTE — H&P (Signed)
History and Physical    [name] [name] KXF:[id number] DOB: [birth year] DOA: 06/22/2017  PCP: Jinny Sanders, MD  Patient coming from: Home  Chief Complaint: Nausea, vomiting   HPI: [name] [name] is a 56 y.o. female with medical history significant of type 2 diabetes mellitus, irritable bowel syndrome, hypothyroidism, hypertension, hyperlipidemia, GERD. She presents emergency department with intractable nausea, vomiting since midnight. She states that she has had 20 episodes of vomiting since midnight. She is also missed a couple doses of her insulin. She denies any fevers or chills, no shortness of breath, chest pain, cough, abdominal pain or diarrhea. Her last bowel movement was this morning and was normal.   ED Course: EMS reported blood glucose of 421. BMP showed metabolic acidosis, positive anion gap. Patient started on IV insulin and admitted to stepdown unit  Review of Systems: As per HPI otherwise 10 point review of systems negative.   Past Medical History:  Diagnosis Date  . Anxiety   . B12 deficiency   . Chest pain 04/05/2017  . GERD (gastroesophageal reflux disease)   . Hyperlipidemia   . Hypertension    [name]  . Hypothyroidism   . IBS (irritable bowel syndrome)   . Intermittent vertigo   . 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 Leg cramping    "@ night" (09/18/2013)  . Migraine    "once q couple months" (09/18/2013)  . Osteoporosis   . Peripheral neuropathy   . Type II diabetes mellitus ([place])   . Umbilical hernia    unrepaired (09/18/2013)    Past Surgical History:  Procedure Laterality Date  . CESAREAN SECTION  1989  . LAPAROSCOPIC ASSISTED VAGINAL HYSTERECTOMY  09/23/2012   Procedure: LAPAROSCOPIC ASSISTED VAGINAL HYSTERECTOMY;  Surgeon: Linda Hedges, DO;  Location: [facility];  Service: Gynecology;  Laterality: N/A;  pull Dr Gregor Hams instrument  . LEFT HEART CATH AND CORONARY ANGIOGRAPHY N/A 02/11/2017   Procedure: Left Heart Cath and  Coronary Angiography;  Surgeon: Lorretta Harp, MD;  Location: [facility];  Service: Cardiovascular;  Laterality: N/A;  . TUBAL LIGATION Bilateral 1992  . VAGINAL HYSTERECTOMY  2013     reports that she quit smoking about 26 years ago. Her smoking use included Cigarettes. She quit after 0.20 years of use. She has never used smokeless tobacco. She reports that she uses drugs, including Marijuana, about 3 times per week. She reports that she does not drink alcohol.  Allergies  Allergen Reactions  . Codeine Hives, Anxiety and Other (See Comments)    Family History  Problem Relation Age of Onset  . Diabetes Other   . Heart disease Other   . Heart failure Mother   . Diabetes Maternal Grandmother   . Alzheimer's disease Maternal Grandmother   . Coronary artery disease Maternal Grandmother   . Heart attack Maternal Grandfather   . Heart failure Brother     Prior to Admission medications   Medication Sig Start Date End Date Taking? Authorizing Provider  aspirin 81 MG chewable tablet Chew 1 tablet (81 mg total) by mouth daily. 02/12/17  Yes Jani Gravel, MD  atorvastatin (LIPITOR) 80 MG tablet Take 1 tablet (80 mg total) by mouth daily at 6 PM. 09/04/16  Yes Bedsole, Amy E, MD  cetirizine (ZYRTEC) 10 MG tablet Take 1 tablet (10 mg total) by mouth daily. MUST SCHEDULE ANNUAL EXAM 05/20/17  Yes Jearld Fenton, NP  ezetimibe (ZETIA) 10 MG tablet Take 1 tablet (10  mg total) by mouth daily. 09/11/16  Yes Bedsole, Amy E, MD  Insulin Glargine (BASAGLAR KWIKPEN) 100 UNIT/ML SOPN Inject 0.45 mLs (45 Units total) into the skin daily at 10 pm. INJECT 55 UNITS INTO THE SKIN DAILY AT 10 PM. 04/08/17  Yes Reyne Dumas, MD  insulin lispro (HUMALOG KWIKPEN) 100 UNIT/ML KiwkPen Inject 0.08 mLs (8 Units total) into the skin 3 (three) times daily. Patient taking differently: Inject 12 Units into the skin 3 (three) times daily.  04/08/17  Yes Reyne Dumas, MD  levothyroxine (SYNTHROID, LEVOTHROID) 150 MCG  tablet Take 1 tablet (150 mcg total) by mouth daily. 04/03/17  Yes Philemon Kingdom, MD  losartan (COZAAR) 25 MG tablet Take 25 mg by mouth daily. 05/27/17  Yes [provider]  metoCLOPramide (REGLAN) 5 MG tablet Take 1 tablet (5 mg total) by mouth 4 (four) times daily -  before meals and at bedtime. 06/05/17  Yes Bonnielee Haff, MD  metoprolol succinate (TOPROL-XL) 25 MG 24 hr tablet Take 25 mg by mouth daily. 03/24/17  Yes [provider]  ondansetron (ZOFRAN) 4 MG tablet Take 1 tablet (4 mg total) by mouth every 6 (six) hours as needed for nausea or vomiting. 06/05/17  Yes Bonnielee Haff, MD  RELPAX 40 MG tablet ONE TABLET BY MOUTH AT ONSET OF HEADACHE. MAY REPEAT IN 2 HOURS IF HEADACHE PERSISTS OR RECURS. 10/17/16  Yes Bedsole, Amy E, MD  sertraline (ZOLOFT) 50 MG tablet Take 1 tablet (50 mg total) by mouth daily. 05/31/17  Yes Bedsole, Amy E, MD  Continuous Blood Gluc Receiver (FREESTYLE LIBRE READER) DEVI 1 Device by Does not apply route 3 (three) times daily. 04/03/17   Philemon Kingdom, MD  Continuous Blood Gluc Sensor (FREESTYLE LIBRE SENSOR SYSTEM) MISC 1 Device by Does not apply route every 30 (thirty) days. 04/03/17   Philemon Kingdom, MD  glucose blood (ONETOUCH VERIO) test strip Use to check blood sugar daily as needed.  Dx: E11.10 06/17/17   Jinny Sanders, MD    Physical Exam: Vitals:   06/22/17 1100 06/22/17 1200 06/22/17 1459 06/22/17 1500  BP: 124/80 121/83 (!) 147/86 (!) 150/86  Pulse: (!) 104 (!) 110 (!) 107 (!) 105  Resp: 16 16 16    Temp:      TempSrc:      SpO2: 97% 96% 99% 100%     Constitutional: NAD, calm, comfortable Eyes: PERRL, lids and conjunctivae normal ENMT: Mucous membranes are dry. Posterior pharynx clear of any exudate or lesions. Neck: normal, supple, no masses, no thyromegaly Respiratory: clear to auscultation bilaterally, no wheezing, no crackles. Normal respiratory effort. No accessory muscle use.  Cardiovascular: Tachycardic rate and  regular rhythm, no murmurs / rubs / gallops. No extremity edema.  Abdomen: no tenderness, no masses palpated. No hepatosplenomegaly. Bowel sounds positive. Soft Musculoskeletal: no clubbing / cyanosis. No joint deformity upper and lower extremities. Good ROM, no contractures. Normal muscle tone.  Skin: no rashes, lesions, ulcers. No induration Neurologic: CN 2-12 grossly intact. Sensation intact, DTR normal. Strength 5/5 in all 4.  Psychiatric: Normal judgment and insight. Alert and oriented x 3. Normal mood.   Labs on Admission: I have personally reviewed following labs and imaging studies  CBC:  Recent Labs Lab 06/22/17 0735  WBC 14.9*  HGB 14.9  HCT 41.8  MCV 83.9  PLT 254   Basic Metabolic Panel:  Recent Labs Lab 06/22/17 0735  NA 137  K 3.6  CL 98*  CO2 19*  GLUCOSE 412*  BUN  13  CREATININE 0.82  CALCIUM 9.3   GFR: CrCl cannot be calculated (Unknown ideal weight.). Liver Function Tests:  Recent Labs Lab 06/22/17 0735  AST 24  ALT 16  ALKPHOS 64  BILITOT 0.6  PROT 7.6  ALBUMIN 4.3    Recent Labs Lab 06/22/17 0735  LIPASE 18   No results for input(s): AMMONIA in the last 168 hours. Coagulation Profile: No results for input(s): INR, PROTIME in the last 168 hours. Cardiac Enzymes: No results for input(s): CKTOTAL, CKMB, CKMBINDEX, TROPONINI in the last 168 hours. BNP (last 3 results) No results for input(s): PROBNP in the last 8760 hours. HbA1C: No results for input(s): HGBA1C in the last 72 hours. CBG:  Recent Labs Lab 06/22/17 0755 06/22/17 1423 06/22/17 1600  GLUCAP 422* 274* 246*   Lipid Profile: No results for input(s): CHOL, HDL, LDLCALC, TRIG, CHOLHDL, LDLDIRECT in the last 72 hours. Thyroid Function Tests: No results for input(s): TSH, T4TOTAL, FREET4, T3FREE, THYROIDAB in the last 72 hours. Anemia Panel: No results for input(s): VITAMINB12, FOLATE, FERRITIN, TIBC, IRON, RETICCTPCT in the last 72 hours. Urine analysis:      Component Value Date/Time   COLORURINE STRAW (A) 06/22/2017 0806   APPEARANCEUR CLEAR 06/22/2017 0806   LABSPEC 1.023 06/22/2017 0806   PHURINE 6.0 06/22/2017 0806   GLUCOSEU >=500 (A) 06/22/2017 0806   HGBUR NEGATIVE 06/22/2017 0806   BILIRUBINUR NEGATIVE 06/22/2017 0806   BILIRUBINUR negative 09/11/2016 1038   KETONESUR 80 (A) 06/22/2017 0806   PROTEINUR 100 (A) 06/22/2017 0806   UROBILINOGEN 0.2 09/11/2016 1038   UROBILINOGEN 0.2 10/28/2013 1723   NITRITE NEGATIVE 06/22/2017 0806   LEUKOCYTESUR NEGATIVE 06/22/2017 0806   Sepsis Labs: !!!!!!!!!!!!!!!!!!!!!!!!!!!!!!!!!!!!!!!!!!!! @LABRCNTIP (procalcitonin:4,lacticidven:4) )No results found for this or any previous visit (from the past 240 hour(s)).   Radiological Exams on Admission: Dg Chest Port 1 View  Result Date: 06/22/2017 CLINICAL DATA:  Diabetic ketoacidosis, nausea and vomiting EXAM: PORTABLE CHEST 1 VIEW COMPARISON:  06/03/2017 FINDINGS: The heart size and mediastinal contours are within normal limits. Both lungs are clear. The visualized skeletal structures are unremarkable. IMPRESSION: No active disease. Electronically Signed   By: Jerilynn Mages.  Shick M.D.   On: 06/22/2017 15:26    Assessment/Plan Active Problems:   DKA (diabetic ketoacidoses) ([place])   DKA -Presenting blood glucose of 956, metabolic acidosis, as well as anion gap 20 -Started on insulin drip -IV fluids -BMP, mag every 4 hours  Intractable nausea and vomiting secondary to gastroparesis -IV Reglan, antiemetic  CAD -Continue aspirin, Lipitor, zetia   Hypothyroidism -Continue Synthroid  Hypertension -Continue metoprolol, Cozaar  Depression -Continue Zoloft    DVT prophylaxis: lovenox Code Status: full  Family Communication: no family at bedside Disposition Plan: pending improvement Consults called: none   Severity of Illness: The appropriate patient status for this patient is OBSERVATION. Observation status is judged to be reasonable and  necessary in order to provide the required intensity of service to ensure the patient's safety. The patient's presenting symptoms, physical exam findings, and initial radiographic and laboratory data in the context of their medical condition is felt to place them at decreased risk for further clinical deterioration. Furthermore, it is anticipated that the patient will be medically stable for discharge from the hospital within 2 midnights of admission. The following factors support the patient status of observation.   " The patient's presenting symptoms include nausea, vomiting. " The physical exam findings include dehydration. " The initial radiographic and laboratory data are consistent with DKA, gastroparesis.  Dessa Phi, DO [facility] [email or web address] Password [facility] 06/22/2017, 4:25 PM

## 2017-06-23 DIAGNOSIS — E1110 Type 2 diabetes mellitus with ketoacidosis without coma: Secondary | ICD-10-CM | POA: Diagnosis not present

## 2017-06-23 DIAGNOSIS — F411 Generalized anxiety disorder: Secondary | ICD-10-CM | POA: Diagnosis not present

## 2017-06-23 DIAGNOSIS — K3184 Gastroparesis: Secondary | ICD-10-CM

## 2017-06-23 LAB — BASIC METABOLIC PANEL
ANION GAP: 11 (ref 5–15)
ANION GAP: 12 (ref 5–15)
BUN: 10 mg/dL (ref 6–20)
BUN: 8 mg/dL (ref 6–20)
CO2: 25 mmol/L (ref 22–32)
CO2: 22 mmol/L (ref 22–32)
Calcium: 8.3 mg/dL — ABNORMAL LOW (ref 8.9–10.3)
Calcium: 8.5 mg/dL — ABNORMAL LOW (ref 8.9–10.3)
Chloride: 102 mmol/L (ref 101–111)
Chloride: 102 mmol/L (ref 101–111)
Creatinine, Ser: 0.48 mg/dL (ref 0.44–1.00)
Creatinine, Ser: 0.53 mg/dL (ref 0.44–1.00)
GFR calc Af Amer: >60 mL/min (ref >60)
GFR calc Af Amer: >60 mL/min (ref >60)
GLUCOSE: 138 mg/dL — AB (ref 65–99)
GLUCOSE: 162 mg/dL — AB (ref 65–99)
POTASSIUM: 2.4 mmol/L — AB (ref 3.5–5.1)
POTASSIUM: 3.3 mmol/L — AB (ref 3.5–5.1)
Sodium: 138 mmol/L (ref 135–145)
Sodium: 136 mmol/L (ref 135–145)

## 2017-06-23 LAB — CBC
HEMATOCRIT: 38 % (ref 36.0–46.0)
HEMOGLOBIN: 13.6 g/dL (ref 12.0–15.0)
MCH: 29.6 pg (ref 26.0–34.0)
MCHC: 35.8 g/dL (ref 30.0–36.0)
MCV: 82.8 fL (ref 78.0–100.0)
Platelets: 276 10*3/uL (ref 150–400)
RBC: 4.59 MIL/uL (ref 3.87–5.11)
RDW: 14.9 % (ref 11.5–15.5)
WBC: 17.6 10*3/uL — AB (ref 4.0–10.5)

## 2017-06-23 LAB — GLUCOSE, CAPILLARY
GLUCOSE-CAPILLARY: 102 mg/dL — AB (ref 65–99)
GLUCOSE-CAPILLARY: 125 mg/dL — AB (ref 65–99)
GLUCOSE-CAPILLARY: 134 mg/dL — AB (ref 65–99)
GLUCOSE-CAPILLARY: 178 mg/dL — AB (ref 65–99)
GLUCOSE-CAPILLARY: 204 mg/dL — AB (ref 65–99)
Glucose-Capillary: 167 mg/dL — ABNORMAL HIGH (ref 65–99)
Glucose-Capillary: 177 mg/dL — ABNORMAL HIGH (ref 65–99)
Glucose-Capillary: 126 mg/dL — ABNORMAL HIGH (ref 65–99)
Glucose-Capillary: 161 mg/dL — ABNORMAL HIGH (ref 65–99)
Glucose-Capillary: 140 mg/dL — ABNORMAL HIGH (ref 65–99)
Glucose-Capillary: 84 mg/dL (ref 65–99)
Glucose-Capillary: 105 mg/dL — ABNORMAL HIGH (ref 65–99)
Glucose-Capillary: 117 mg/dL — ABNORMAL HIGH (ref 65–99)

## 2017-06-23 LAB — MAGNESIUM
Magnesium: 2.6 mg/dL — ABNORMAL HIGH (ref 1.7–2.4)
Magnesium: 2.4 mg/dL (ref 1.7–2.4)

## 2017-06-23 MED ORDER — METOCLOPRAMIDE HCL 5 MG/ML IJ SOLN
5.0000 mg | Freq: Three times a day (TID) | INTRAMUSCULAR | Status: DC
  Administered 2017-06-23 – 2017-06-24 (×5): 5 mg via INTRAVENOUS
  Filled 2017-06-23 (×5): qty 2

## 2017-06-23 MED ORDER — ONDANSETRON HCL 4 MG/2ML IJ SOLN
4.0000 mg | Freq: Once | INTRAMUSCULAR | Status: AC
  Administered 2017-06-23: 4 mg via INTRAVENOUS
  Filled 2017-06-23: qty 2

## 2017-06-23 MED ORDER — INSULIN ASPART 100 UNIT/ML ~~LOC~~ SOLN
0.0000 [IU] | Freq: Three times a day (TID) | SUBCUTANEOUS | Status: DC
  Administered 2017-06-24: 1 [IU] via SUBCUTANEOUS

## 2017-06-23 MED ORDER — INSULIN GLARGINE 100 UNIT/ML ~~LOC~~ SOLN
35.0000 [IU] | Freq: Every day | SUBCUTANEOUS | Status: DC
  Administered 2017-06-23 – 2017-06-24 (×2): 35 [IU] via SUBCUTANEOUS
  Filled 2017-06-23 (×2): qty 0.35

## 2017-06-23 MED ORDER — POTASSIUM CHLORIDE 10 MEQ/100ML IV SOLN
10.0000 meq | INTRAVENOUS | Status: AC
  Administered 2017-06-23 (×3): 10 meq via INTRAVENOUS
  Filled 2017-06-23 (×3): qty 100

## 2017-06-23 MED ORDER — HYDROCODONE-ACETAMINOPHEN 5-325 MG PO TABS
2.0000 | ORAL_TABLET | Freq: Once | ORAL | Status: AC
  Administered 2017-06-24: 2 via ORAL
  Filled 2017-06-23: qty 2

## 2017-06-23 MED ORDER — MORPHINE SULFATE (PF) 2 MG/ML IV SOLN
2.0000 mg | Freq: Once | INTRAVENOUS | Status: AC
  Administered 2017-06-23: 2 mg via INTRAVENOUS
  Filled 2017-06-23: qty 1

## 2017-06-23 NOTE — Progress Notes (Signed)
CRITICAL VALUE ALERT  Critical Value:  K+ 2.4   Date & Time Notied: 06/22/17 @ 2355  Provider Notified: [facility]  Orders Received/Actions taken: 3 runs K+

## 2017-06-23 NOTE — Progress Notes (Signed)
PROGRESS NOTE    [name] [name]  PTW:[id number] DOB: 06/21/1961 DOA: 06/22/2017 PCP: Jinny [facility], MD  Brief Narrative:[name] [name] is a 56 y.o. female with medical history significant of type 2 diabetes mellitus, irritable bowel syndrome, hypothyroidism, hypertension, hyperlipidemia, GERD. She presents emergency department with intractable nausea, vomiting x1day, in ED, DKA   Assessment & Plan:   Principal Problem:   DKA (diabetic ketoacidoses) ([place]) -likely triggered by gastroparesis, missing Insulin etc -anion gap has closed, start lantus, SSi -change IVF to NS -FU Hba1c    Diabetic gastroparesis ([place]) -DKA contributing too -improving, continue reglan, advance diet as tol   Hypokalemia -from GI losses, Insulin -replace   CAD/Ischemic cardiomyopathy -stable, continue ASA/lipitor -compensated, IVF for now    Hypothyroidism, acquired -continue synthroid  DVT prophylaxis:lovenox Code Status: Full Code Family Communication: None at bedside Disposition Plan: Home when improved, ? Tomorrow, Tx to floor   Subjective: Feels much better, no vomiting today Objective: Vitals:   06/23/17 0644 06/23/17 0724 06/23/17 0800 06/23/17 1010  BP:   129/78 (!) 153/92  Pulse:   89 88  Resp:   (!) 21 15  Temp: 98 F (36.7 C) 98.1 F (36.7 C)    TempSrc: Oral Oral    SpO2:   96% 97%  Weight:      Height:        Intake/Output Summary (Last 24 hours) at 06/23/17 1140 Last data filed at 06/23/17 1009  Gross per 24 hour  Intake          2424.59 ml  Output             2150 ml  Net           274.59 ml   Filed Weights   06/22/17 1659  Weight: 57.2 kg (126 lb 1.7 oz)    Examination:  General exam: Appears calm and comfortable, frail, thinly built Respiratory system: Clear to auscultation. Respiratory effort normal. Cardiovascular system: S1 & S2 heard, RRR. No JVD Gastrointestinal system: Abdomen is nondistended, soft and nontender. Normal bowel sounds heard. Central  nervous system: Alert and oriented. No focal neurological deficits. Extremities: Symmetric 5 x 5 power. Skin: No rashes, lesions or ulcers Psychiatry: Judgement and insight appear normal. Mood & affect appropriate.     Data Reviewed:   CBC:  Recent Labs Lab 06/22/17 0735 06/23/17 0552  WBC 14.9* 17.6*  HGB 14.9 13.6  HCT 41.8 38.0  MCV 83.9 82.8  PLT 324 700   Basic Metabolic Panel:  Recent Labs Lab 06/22/17 0735 06/22/17 1756 06/22/17 2241 06/23/17 0058 06/23/17 0552  NA 137  --  136 138 136  K 3.6  --  2.4* 2.4* 3.3*  CL 98*  --  101 102 102  CO2 19*  --  28 25 22   GLUCOSE 412*  --  157* 162* 138*  BUN 13  --  9 10 8   CREATININE 0.82  --  0.52 0.48 0.53  CALCIUM 9.3  --  8.3* 8.3* 8.5*  MG  --  1.6* 1.6* 2.6* 2.4   GFR: Estimated Creatinine Clearance: 61.4 mL/min (by C-G formula based on SCr of 0.53 mg/dL). Liver Function Tests:  Recent Labs Lab 06/22/17 0735  AST 24  ALT 16  ALKPHOS 64  BILITOT 0.6  PROT 7.6  ALBUMIN 4.3    Recent Labs Lab 06/22/17 0735  LIPASE 18   No results for input(s): AMMONIA in the last 168 hours. Coagulation Profile: No results for input(s):  INR, PROTIME in the last 168 hours. Cardiac Enzymes: No results for input(s): CKTOTAL, CKMB, CKMBINDEX, TROPONINI in the last 168 hours. BNP (last 3 results) No results for input(s): PROBNP in the last 8760 hours. HbA1C: No results for input(s): HGBA1C in the last 72 hours. CBG:  Recent Labs Lab 06/23/17 0526 06/23/17 0636 06/23/17 0741 06/23/17 0858 06/23/17 1007  GLUCAP 126* 161* 204* 178* 140*   Lipid Profile: No results for input(s): CHOL, HDL, LDLCALC, TRIG, CHOLHDL, LDLDIRECT in the last 72 hours. Thyroid Function Tests: No results for input(s): TSH, T4TOTAL, FREET4, T3FREE, THYROIDAB in the last 72 hours. Anemia Panel: No results for input(s): VITAMINB12, FOLATE, FERRITIN, TIBC, IRON, RETICCTPCT in the last 72 hours. Urine analysis:    Component Value  Date/Time   COLORURINE STRAW (A) 06/22/2017 0806   APPEARANCEUR CLEAR 06/22/2017 0806   LABSPEC 1.023 06/22/2017 0806   PHURINE 6.0 06/22/2017 0806   GLUCOSEU >=500 (A) 06/22/2017 0806   HGBUR NEGATIVE 06/22/2017 0806   BILIRUBINUR NEGATIVE 06/22/2017 0806   BILIRUBINUR negative 09/11/2016 1038   KETONESUR 80 (A) 06/22/2017 0806   PROTEINUR 100 (A) 06/22/2017 0806   UROBILINOGEN 0.2 09/11/2016 1038   UROBILINOGEN 0.2 10/28/2013 1723   NITRITE NEGATIVE 06/22/2017 0806   LEUKOCYTESUR NEGATIVE 06/22/2017 0806   Sepsis Labs: @LABRCNTIP (procalcitonin:4,lacticidven:4)  )No results found for this or any previous visit (from the past 240 hour(s)).       Radiology Studies: Dg Chest Port 1 View  Result Date: 06/22/2017 CLINICAL DATA:  Diabetic ketoacidosis, nausea and vomiting EXAM: PORTABLE CHEST 1 VIEW COMPARISON:  06/03/2017 FINDINGS: The heart size and mediastinal contours are within normal limits. Both lungs are clear. The visualized skeletal structures are unremarkable. IMPRESSION: No active disease. Electronically Signed   By: Jerilynn Mages.  Shick M.D.   On: 06/22/2017 15:26        Scheduled Meds: . aspirin  81 mg Oral Daily  . atorvastatin  80 mg Oral q1800  . enoxaparin (LOVENOX) injection  40 mg Subcutaneous Q24H  . ezetimibe  10 mg Oral Daily  . insulin aspart  0-9 Units Subcutaneous TID WC  . insulin glargine  35 Units Subcutaneous Daily  . insulin regular  0-10 Units Intravenous TID WC  . levothyroxine  150 mcg Oral QAC breakfast  . losartan  25 mg Oral Daily  . metoCLOPramide (REGLAN) injection  5 mg Intravenous TID AC  . metoprolol succinate  25 mg Oral Daily  . ondansetron (ZOFRAN) IV  4 mg Intravenous Q6H  . sertraline  50 mg Oral Daily   Continuous Infusions: . sodium chloride 75 mL/hr at 06/23/17 1000  . dextrose 5 % and 0.45% NaCl Stopped (06/23/17 1000)     LOS: 0 days    Time spent: 58min    Domenic Polite, MD [facility] Pager  [phone]  If 7PM-7AM, please contact night-coverage [email or web address] Password [email or web address] 06/23/2017, 11:40 AM

## 2017-06-24 DIAGNOSIS — K3184 Gastroparesis: Secondary | ICD-10-CM | POA: Diagnosis not present

## 2017-06-24 DIAGNOSIS — E1110 Type 2 diabetes mellitus with ketoacidosis without coma: Secondary | ICD-10-CM | POA: Diagnosis not present

## 2017-06-24 DIAGNOSIS — F411 Generalized anxiety disorder: Secondary | ICD-10-CM | POA: Diagnosis not present

## 2017-06-24 LAB — BASIC METABOLIC PANEL
ANION GAP: 10 (ref 5–15)
Anion gap: 12 (ref 5–15)
BUN: 6 mg/dL (ref 6–20)
BUN: 6 mg/dL (ref 6–20)
CALCIUM: 8.5 mg/dL — AB (ref 8.9–10.3)
CALCIUM: 8.6 mg/dL — AB (ref 8.9–10.3)
CHLORIDE: 99 mmol/L — AB (ref 101–111)
CO2: 22 mmol/L (ref 22–32)
CO2: 23 mmol/L (ref 22–32)
CREATININE: 0.52 mg/dL (ref 0.44–1.00)
Chloride: 100 mmol/L — ABNORMAL LOW (ref 101–111)
Creatinine, Ser: 0.5 mg/dL (ref 0.44–1.00)
GLUCOSE: 141 mg/dL — AB (ref 65–99)
Glucose, Bld: 171 mg/dL — ABNORMAL HIGH (ref 65–99)
Potassium: 2.9 mmol/L — ABNORMAL LOW (ref 3.5–5.1)
Potassium: 3.6 mmol/L (ref 3.5–5.1)
SODIUM: 133 mmol/L — AB (ref 135–145)
Sodium: 133 mmol/L — ABNORMAL LOW (ref 135–145)

## 2017-06-24 LAB — MRSA PCR SCREENING: MRSA by PCR: NEGATIVE

## 2017-06-24 LAB — GLUCOSE, CAPILLARY
GLUCOSE-CAPILLARY: 161 mg/dL — AB (ref 65–99)
Glucose-Capillary: 101 mg/dL — ABNORMAL HIGH (ref 65–99)
Glucose-Capillary: 125 mg/dL — ABNORMAL HIGH (ref 65–99)

## 2017-06-24 MED ORDER — FENTANYL CITRATE (PF) 100 MCG/2ML IJ SOLN
INTRAMUSCULAR | Status: AC
  Filled 2017-06-24: qty 4

## 2017-06-24 MED ORDER — POTASSIUM CHLORIDE 10 MEQ/100ML IV SOLN
10.0000 meq | INTRAVENOUS | Status: AC
  Administered 2017-06-24 (×4): 10 meq via INTRAVENOUS
  Filled 2017-06-24 (×4): qty 100

## 2017-06-24 MED ORDER — TAMSULOSIN HCL 0.4 MG PO CAPS: 0.4000 mg | ORAL_CAPSULE | Freq: Every day | ORAL | Status: DC

## 2017-06-24 MED ORDER — POTASSIUM CHLORIDE CRYS ER 20 MEQ PO TBCR
40.0000 meq | EXTENDED_RELEASE_TABLET | ORAL | Status: AC
  Administered 2017-06-24 (×2): 40 meq via ORAL
  Filled 2017-06-24 (×2): qty 2

## 2017-06-24 MED ORDER — PROMETHAZINE HCL 25 MG/ML IJ SOLN
INTRAMUSCULAR | Status: AC
  Filled 2017-06-24: qty 1

## 2017-06-24 NOTE — Progress Notes (Signed)
Discharge instructions explained to pt, no scripts given. Pt states she has no questions. Discharge via wheelchair. Husband taking pt home.

## 2017-06-24 NOTE — Discharge Summary (Signed)
Physician Discharge Summary  [name] [name]:[id number] DOB: 21-Nov-1961 DOA: 06/22/2017  PCP: Jinny Sanders, MD  Admit date: 06/22/2017 Discharge date: 06/24/2017  Time spent: 35 minutes  Recommendations for Outpatient Follow-up:  1. PCP Dr.Besole in 1 week 2. 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 She presents emergency department with intractable nausea, vomiting x1day, in ED, DKA  Hospital Course:  DKA (diabetic ketoacidoses) ([place]) -likely triggered by gastroparesis, N/V and as a result missing Insulin etc -treated with IVF, Insulin gtt, no evidence of infection or ACS, anion gap has closed, started lantus, SSi -diet advanced and tolerating this for the most part -last Hba1c was 10.8 from March which is improving from prior, advised to continue lantus at lower dose even when she's unable to eat much during flare of gastroparesis    Diabetic gastroparesis ([place]) -DKA contributing too -improved, continue reglan, advanced diet slowly   Hypokalemia -from GI losses, Insulin -replaced   CAD/Ischemic cardiomyopathy -stable, continue ASA/lipitor -compensated, IVF for now    Hypothyroidism, acquired -continue synthroid  Discharge Exam: Vitals:   06/24/17 1114 06/24/17 1335  BP: (!) 154/90 (!) 152/94  Pulse: 69 72  Resp:  16  Temp:  98.6 F (37 C)    General: AAOx3 Cardiovascular: S1S2/RRR Respiratory: CTAB  Discharge  Instructions   Discharge Instructions    Diet Carb Modified    Complete by:  As directed    Increase activity slowly    Complete by:  As directed      Current Discharge Medication List    CONTINUE these medications which have NOT CHANGED   Details  aspirin 81 MG chewable tablet Chew 1 tablet (81 mg total) by mouth daily. Qty: 30 tablet, Refills: 0    atorvastatin (LIPITOR) 80 MG tablet Take 1 tablet (80 mg total) by mouth daily at 6 PM. Qty: 90 tablet, Refills: 0    cetirizine (ZYRTEC) 10 MG tablet Take 1 tablet (10 mg total) by mouth daily. MUST SCHEDULE ANNUAL EXAM Qty: 30 tablet, Refills: 1    ezetimibe (ZETIA) 10 MG tablet Take 1 tablet (10 mg total) by mouth daily. Qty: 30 tablet, Refills: 11    Insulin Glargine (BASAGLAR KWIKPEN) 100 UNIT/ML SOPN Inject 0.45 mLs (45 Units total) into the skin daily at 10 pm. INJECT 55 UNITS INTO THE SKIN DAILY AT 10 PM. Qty: 45 pen, Refills: 1    insulin lispro (HUMALOG KWIKPEN) 100 UNIT/ML KiwkPen Inject 0.08 mLs (8 Units total) into the skin 3 (three) times daily. Qty: 15 mL, Refills: 11    levothyroxine (SYNTHROID, LEVOTHROID) 150 MCG tablet Take 1 tablet (150 mcg total) by mouth daily. Qty: 90 tablet, Refills: 3    losartan (COZAAR) 25 MG tablet Take 25 mg by mouth daily. Refills: 12    metoCLOPramide (REGLAN) 5 MG tablet Take 1 tablet (5 mg total) by mouth 4 (four) times daily -  before meals and at bedtime. Qty: 120 tablet, Refills: 0    metoprolol succinate (  TOPROL-XL) 25 MG 24 hr tablet Take 25 mg by mouth daily. Refills: 12    ondansetron (ZOFRAN) 4 MG tablet Take 1 tablet (4 mg total) by mouth every 6 (six) hours as needed for nausea or vomiting. Qty: 30 tablet, Refills: 0    RELPAX 40 MG tablet ONE TABLET BY MOUTH AT ONSET OF HEADACHE. MAY REPEAT IN 2 HOURS IF HEADACHE PERSISTS OR RECURS. Qty: 10 tablet, Refills: 2    sertraline (ZOLOFT) 50 MG tablet Take 1 tablet (50 mg total) by mouth daily. Qty: 30 tablet,  Refills: 3    Continuous Blood Gluc Receiver (FREESTYLE LIBRE READER) DEVI 1 Device by Does not apply route 3 (three) times daily. Qty: 1 Device, Refills: 1    Continuous Blood Gluc Sensor (FREESTYLE LIBRE SENSOR SYSTEM) MISC 1 Device by Does not apply route every 30 (thirty) days. Qty: 3 each, Refills: 11    glucose blood (ONETOUCH VERIO) test strip Use to check blood sugar daily as needed.  Dx: E11.10 Qty: 100 each, Refills: 3       Allergies  Allergen Reactions  . Codeine Hives, Anxiety and Other (See Comments)   Follow-up Information    Bedsole, Amy E, MD. Schedule an appointment as soon as possible for a visit in 1 week(s).   Specialty:  Family Medicine Contact information: [place] [address] [phone]            The results of significant diagnostics from this hospitalization (including imaging, microbiology, ancillary and laboratory) are listed below for reference.    Significant Diagnostic Studies: Dg Chest 2 View  Result Date: 06/03/2017 CLINICAL DATA:  Nausea and vomiting.  Central chest pain.  Cough. EXAM: CHEST  2 VIEW COMPARISON:  04/05/2017. FINDINGS: Normal sized heart. Clear lungs with normal vascularity. Stable mild diffuse peribronchial thickening and accentuation of the interstitial markings. Unremarkable bones. IMPRESSION: No acute abnormality.  Stable mild chronic bronchitic changes. Electronically Signed   By: Claudie Revering M.D.   On: 06/03/2017 13:15   Ct Abdomen Pelvis W Contrast  Result Date: 06/03/2017 CLINICAL DATA:  56 year old diabetic hypertensive female with lower abdominal pain, nausea, vomiting and diarrhea since early this morning. Initial encounter. EXAM: CT ABDOMEN AND PELVIS WITH CONTRAST TECHNIQUE: Multidetector CT imaging of the abdomen and pelvis was performed using the standard protocol following bolus administration of intravenous contrast. CONTRAST:  192mL ISOVUE-300 IOPAMIDOL (ISOVUE-300) INJECTION 61%  COMPARISON:  10/16/2013 and 09/18/2013 CT FINDINGS: Lower chest: No worrisome lung base abnormality. Hepatobiliary: Enlarged fatty liver spanning over 21.8 cm without worrisome hepatic lesion. Phrygian cap with progressive enhancement. Elective ultrasound recommended to determine if there is an underlying polypoid lesion. Pancreas: No pancreatic mass or inflammation. Spleen: No mass or enlargement. Adrenals/Urinary Tract: No hydronephrosis or obstructing stone. No renal or adrenal mass. Noncontrast filled views of the urinary bladder unremarkable. Stomach/Bowel: Majority of the colon is under distended limiting evaluation for detection of colitis. Scattered diverticula. No extraluminal bowel inflammatory process, free fluid or free air. Radiopaque material in the appendix may be related to contrast or stones. No CT evidence of inflammation of the appendix. Mild circumferential thickening distal esophagus unchanged from prior exams and may be normal for this patient. Vascular/Lymphatic: No aneurysm or large vessel occlusion. No adenopathy. Reproductive: Post hysterectomy.  No worrisome adnexal abnormality. Other: Periumbilical fat and vessel containing hernia. Musculoskeletal: Mild curvature lumbar spine convex right. No worrisome osseous abnormality. IMPRESSION: Majority of the colon is under distended. This gives the appearance  of circumferential wall thickening and therefore limits evaluation for the possibility of mild colitis. There is however, no evidence of extraluminal bowel inflammatory process, free fluid or free air. No inflammation surrounds scattered diverticula or appendix. Enlarged fatty liver spanning over 21.8 cm. Phrygian cap with progressive enhancement. Elective ultrasound recommended to determine if there is an underlying polypoid lesion. Periumbilical fat and vessel containing hernia. Post hysterectomy. Electronically Signed   By: Genia Del M.D.   On: 06/03/2017 14:50   Dg Chest Port 1  View  Result Date: 06/22/2017 CLINICAL DATA:  Diabetic ketoacidosis, nausea and vomiting EXAM: PORTABLE CHEST 1 VIEW COMPARISON:  06/03/2017 FINDINGS: The heart size and mediastinal contours are within normal limits. Both lungs are clear. The visualized skeletal structures are unremarkable. IMPRESSION: No active disease. Electronically Signed   By: Jerilynn Mages.  Shick M.D.   On: 06/22/2017 15:26   US Abdomen Limited Ruq  Addendum Date: 06/18/2017   ADDENDUM REPORT: 06/18/2017 11:38 ADDENDUM: Hepatopetal portal vein flow is noted. Electronically Signed   By: Ivar Drape M.D.   On: 06/18/2017 11:38   Result Date: 06/18/2017 CLINICAL DATA:  Questioned abnormality of the gallbladder by CT EXAM: ULTRASOUND ABDOMEN LIMITED RIGHT UPPER QUADRANT COMPARISON:  CT the abdomen pelvis of 06/03/2017 FINDINGS: Gallbladder: The gallbladder is moderately well visualized. The questioned abnormality near the fundus of the gallbladder by CT is not definitely seen by ultrasound. No solid mass is seen and no gallstones or gallbladder polyps are evident. No adjacent abnormality of the liver is seen. Common bile duct: Diameter: The common bile duct is normal measuring 5.2 mm in diameter Liver: The liver is slightly echogenic and inhomogeneous suggesting fatty infiltration. No focal hepatic abnormality is noted. IMPRESSION: 1. No definite abnormality is seen near the fundus of the gallbladder as questioned on CT. Followup CT scan in 3-4 months may be helpful to assess further. No gallstones. 2. Inhomogeneous echogenic liver parenchyma consistent with fatty infiltration. Electronically Signed: By: Ivar Drape M.D. On: 06/18/2017 11:07    Microbiology: No results found for this or any previous visit (from the past 240 hour(s)).   Labs: Basic Metabolic Panel:  Recent Labs Lab 06/22/17 1756 06/22/17 2241 06/23/17 0058 06/23/17 0552 06/24/17 0340 06/24/17 1121  NA  --  136 138 136 133* 133*  K  --  2.4* 2.4* 3.3* 2.9* 3.6  CL   --  101 102 102 99* 100*  CO2  --  28 25 22 22 23   GLUCOSE  --  157* 162* 138* 171* 141*  BUN  --  9 10 8 6 6   CREATININE  --  0.52 0.48 0.53 0.50 0.52  CALCIUM  --  8.3* 8.3* 8.5* 8.5* 8.6*  MG 1.6* 1.6* 2.6* 2.4  --   --    Liver Function Tests:  Recent Labs Lab 06/22/17 0735  AST 24  ALT 16  ALKPHOS 64  BILITOT 0.6  PROT 7.6  ALBUMIN 4.3    Recent Labs Lab 06/22/17 0735  LIPASE 18   No results for input(s): AMMONIA in the last 168 hours. CBC:  Recent Labs Lab 06/22/17 0735 06/23/17 0552  WBC 14.9* 17.6*  HGB 14.9 13.6  HCT 41.8 38.0  MCV 83.9 82.8  PLT 324 276   Cardiac Enzymes: No results for input(s): CKTOTAL, CKMB, CKMBINDEX, TROPONINI in the last 168 hours. BNP: BNP (last 3 results) No results for input(s): BNP in the last 8760 hours.  ProBNP (last 3 results) No results for input(s): PROBNP  in the last 8760 hours.  CBG:  Recent Labs Lab 06/23/17 2057 06/23/17 2324 06/24/17 0324 06/24/17 0751 06/24/17 1145  GLUCAP 102* 117* 161* 101* 125*       SignedDomenic Polite MD.  [facility] 06/24/2017, 2:16 PM

## 2017-06-24 NOTE — Care Management Note (Addendum)
Case Management Note  Patient Details  Name: [name] [name] MRN: [id number] Date of Birth: 1961/05/02  Subjective/Objective:   Hyperglycemia due to persistent nausea and vomiting                 Action/Plan: Date:  June 24, 2017 Chart reviewed for concurrent status and case management needs. Will continue to follow patient progress. Discharge Planning: following for needs Expected discharge date: [id number] Velva Harman, BSN, [place], [place]  Expected Discharge Date:  06/23/17               Expected Discharge Plan:  Home/Self Care  In-House Referral:     Discharge planning Services  CM Consult  Post Acute Care Choice:    Choice offered to:     DME Arranged:    DME Agency:     HH Arranged:    HH Agency:     Status of Service:  In process, will continue to follow  If discussed at Long Length of Stay Meetings, dates discussed:    Additional Comments:  Leeroy Cha, RN 06/24/2017, 8:43 AM

## 2017-06-24 NOTE — Progress Notes (Signed)
Transferred to room 1324 via bed.

## 2017-06-25 ENCOUNTER — Telehealth: Payer: Self-pay | Admitting: *Deleted

## 2017-06-25 NOTE — Telephone Encounter (Signed)
Lm requesting return call to complete TCM and confirm hosp f/u appt  

## 2017-06-26 NOTE — Telephone Encounter (Signed)
Lm requesting return call to complete TCM and confirm hosp f/u appt  

## 2017-07-09 ENCOUNTER — Other Ambulatory Visit: Payer: Self-pay | Admitting: Family Medicine

## 2017-07-09 NOTE — Telephone Encounter (Signed)
Last office visit 06/07/2017.  Last refilled 06/05/2017.  Ok to refill?

## 2017-07-19 ENCOUNTER — Ambulatory Visit: Payer: 59 | Admitting: Internal Medicine

## 2017-08-04 ENCOUNTER — Other Ambulatory Visit: Payer: Self-pay | Admitting: Internal Medicine

## 2017-08-10 ENCOUNTER — Other Ambulatory Visit: Payer: Self-pay | Admitting: Family Medicine

## 2017-08-10 NOTE — Telephone Encounter (Signed)
Last office visit 06/07/17.  Last refilled 07/09/17 for #120 with no refills.  Ok to refill?

## 2017-08-16 ENCOUNTER — Other Ambulatory Visit: Payer: Self-pay | Admitting: Family Medicine

## 2017-08-16 NOTE — Telephone Encounter (Signed)
Last office visit 06/07/2017.  Last refilled 07/09/2017 for #30 with no refills.

## 2017-09-02 ENCOUNTER — Other Ambulatory Visit: Payer: Self-pay | Admitting: *Deleted

## 2017-09-02 MED ORDER — SERTRALINE HCL 50 MG PO TABS: 50.0000 mg | ORAL_TABLET | Freq: Every day | ORAL | 0 refills | Status: DC

## 2017-09-10 ENCOUNTER — Other Ambulatory Visit: Payer: Self-pay | Admitting: *Deleted

## 2017-09-10 MED ORDER — METOCLOPRAMIDE HCL 5 MG PO TABS: ORAL_TABLET | ORAL | 0 refills | Status: DC

## 2017-09-10 NOTE — Telephone Encounter (Signed)
Last office visit 06/07/2017.  Last refilled 08/13/2017 for #120 with no refills.  Ok to refill?

## 2017-09-11 ENCOUNTER — Ambulatory Visit: Payer: 59 | Admitting: Internal Medicine

## 2017-09-12 ENCOUNTER — Encounter: Payer: Self-pay | Admitting: Family Medicine

## 2017-09-12 ENCOUNTER — Ambulatory Visit (INDEPENDENT_AMBULATORY_CARE_PROVIDER_SITE_OTHER): Payer: 59 | Admitting: Family Medicine

## 2017-09-12 VITALS — BP 100/60 | HR 91 | Temp 97.5°F | Ht 61.0 in | Wt 127.5 lb

## 2017-09-12 DIAGNOSIS — I10 Essential (primary) hypertension: Secondary | ICD-10-CM | POA: Diagnosis not present

## 2017-09-12 DIAGNOSIS — F411 Generalized anxiety disorder: Secondary | ICD-10-CM

## 2017-09-12 DIAGNOSIS — E1143 Type 2 diabetes mellitus with diabetic autonomic (poly)neuropathy: Secondary | ICD-10-CM

## 2017-09-12 DIAGNOSIS — N182 Chronic kidney disease, stage 2 (mild): Secondary | ICD-10-CM

## 2017-09-12 DIAGNOSIS — E781 Pure hyperglyceridemia: Secondary | ICD-10-CM | POA: Diagnosis not present

## 2017-09-12 DIAGNOSIS — K3184 Gastroparesis: Secondary | ICD-10-CM

## 2017-09-12 DIAGNOSIS — Z23 Encounter for immunization: Secondary | ICD-10-CM

## 2017-09-12 DIAGNOSIS — Z794 Long term (current) use of insulin: Secondary | ICD-10-CM

## 2017-09-12 DIAGNOSIS — E1110 Type 2 diabetes mellitus with ketoacidosis without coma: Secondary | ICD-10-CM | POA: Diagnosis not present

## 2017-09-12 DIAGNOSIS — I428 Other cardiomyopathies: Secondary | ICD-10-CM

## 2017-09-12 NOTE — Assessment & Plan Note (Addendum)
Followed by ENDO. Continue regularly taking medication and make follow up with ENDO.

## 2017-09-12 NOTE — Assessment & Plan Note (Signed)
Stable control on sertraline daily. 

## 2017-09-12 NOTE — Progress Notes (Signed)
Subjective:    Patient ID: [name] [name], female    DOB: 1961-07-22, 56 y.o.   MRN: [id number]  HPI  56 year old female presents for 3 month follow up multiple issues.  DM.. Managed by ENDO but last OV with Dr. Cruzita Lederer appears to have been  In 03/2017.  Planning to schedule.. Will do labs then for A1C and lipids. Due for eye exam.  Hypertension:    On toprol and losartan BP Readings from Last 3 Encounters:  09/12/17 100/60  06/24/17 (!) 152/94  06/07/17 102/68  Using medication without problems or lightheadedness: none Chest pain with exertion:none Edema:none Short of breath: none Average home BPs: Other issues: CAD and ischemic cardiomyopthy. Dr. Wynonia Lawman Cardiology.  Due for chol check on atorvastatin, zetia.  Wt Readings from Last 3 Encounters:  09/12/17 127 lb 8 oz (57.8 kg)  06/22/17 126 lb 1.7 oz (57.2 kg)  06/07/17 127 lb 8 oz (57.8 kg)   GAD, stable control on sertraline 50 mg daily.  GAD 3 PHQ9   Reglan is helping with diabetic gastroparesis  Blood pressure 100/60, pulse 91, temperature (!) 97.5 F (36.4 C), temperature source Oral, height 5\' 1"  (1.549 m), weight 127 lb 8 oz (57.8 kg), last menstrual period 08/11/2012.  Review of Systems  Constitutional: Negative for fatigue and fever.  HENT: Negative for ear pain.   Eyes: Negative for pain.  Respiratory: Negative for chest tightness and shortness of breath.   Cardiovascular: Positive for chest pain. Negative for palpitations and leg swelling.       Occ sharp no relation to exertion.. Likely due to GERD  Gastrointestinal: Negative for abdominal pain.  Genitourinary: Negative for dysuria.       Objective:   Physical Exam  Constitutional: Vital signs are normal. She appears well-developed and well-nourished. She is cooperative.  Non-toxic appearance. She does not appear ill. No distress.  HENT:  Head: Normocephalic.  Right Ear: Hearing, tympanic membrane, external ear and ear canal normal. Tympanic  membrane is not erythematous, not retracted and not bulging.  Left Ear: Hearing, tympanic membrane, external ear and ear canal normal. Tympanic membrane is not erythematous, not retracted and not bulging.  Nose: No mucosal edema or rhinorrhea. Right sinus exhibits no maxillary sinus tenderness and no frontal sinus tenderness. Left sinus exhibits no maxillary sinus tenderness and no frontal sinus tenderness.  Mouth/Throat: Uvula is midline, oropharynx is clear and moist and mucous membranes are normal.  Eyes: Pupils are equal, round, and reactive to light. Conjunctivae, EOM and lids are normal. Lids are everted and swept, no foreign bodies found.  Neck: Trachea normal and normal range of motion. Neck supple. Carotid bruit is not present. No thyroid mass and no thyromegaly present.  Cardiovascular: Regular rhythm, S1 normal, S2 normal, normal heart sounds, intact distal pulses and normal pulses.  Tachycardia present.  Exam reveals no gallop and no friction rub.   No murmur heard. Pulmonary/Chest: Effort normal and breath sounds normal. No tachypnea. No respiratory distress. She has no decreased breath sounds. She has no wheezes. She has no rhonchi. She has no rales.  Abdominal: Soft. Normal appearance and bowel sounds are normal. There is no tenderness.  Neurological: She is alert.  Skin: Skin is warm, dry and intact. No rash noted.  Psychiatric: Her speech is normal and behavior is normal. Judgment and thought content normal. Her mood appears not anxious. Cognition and memory are normal. She does not exhibit a depressed mood.  Diabetic foot exam: Normal inspection No skin breakdown No calluses  Normal DP pulses Normal sensation to light touch and monofilament Nails normal     Assessment & Plan:

## 2017-09-12 NOTE — Assessment & Plan Note (Signed)
Well controlled. Continue current medication.  

## 2017-09-12 NOTE — Assessment & Plan Note (Signed)
Temporarily increase reglan to see if pain improves.. if not follow up with GI. Due for colonoscopy in 3.2019.

## 2017-09-12 NOTE — Patient Instructions (Addendum)
For increased abdominal pain.. Can temporarily increase Reglan 2 tabs four times daily.. But no longer than 2 weeks. If pain and symptoms not improving.. Return to seen GI.  Get yearly eye exam. Keep taking DM meds and make follow up with ENDO.

## 2017-09-17 ENCOUNTER — Emergency Department (HOSPITAL_COMMUNITY): Payer: 59

## 2017-09-17 ENCOUNTER — Observation Stay (HOSPITAL_BASED_OUTPATIENT_CLINIC_OR_DEPARTMENT_OTHER)
Admission: EM | Admit: 2017-09-17 | Discharge: 2017-09-18 | Disposition: A | Discharge status: Home / Self Care | Payer: 59 | Source: Home / Self Care | Attending: Emergency Medicine | Admitting: Emergency Medicine

## 2017-09-17 ENCOUNTER — Encounter (HOSPITAL_COMMUNITY): Payer: Self-pay | Admitting: Emergency Medicine

## 2017-09-17 DIAGNOSIS — D72829 Elevated white blood cell count, unspecified: Secondary | ICD-10-CM

## 2017-09-17 DIAGNOSIS — R6510 Systemic inflammatory response syndrome (SIRS) of non-infectious origin without acute organ dysfunction: Secondary | ICD-10-CM | POA: Insufficient documentation

## 2017-09-17 DIAGNOSIS — E1110 Type 2 diabetes mellitus with ketoacidosis without coma: Secondary | ICD-10-CM | POA: Diagnosis present

## 2017-09-17 DIAGNOSIS — Z7982 Long term (current) use of aspirin: Secondary | ICD-10-CM | POA: Insufficient documentation

## 2017-09-17 DIAGNOSIS — Z87891 Personal history of nicotine dependence: Secondary | ICD-10-CM

## 2017-09-17 DIAGNOSIS — I252 Old myocardial infarction: Secondary | ICD-10-CM

## 2017-09-17 DIAGNOSIS — K589 Irritable bowel syndrome without diarrhea: Secondary | ICD-10-CM

## 2017-09-17 DIAGNOSIS — E1142 Type 2 diabetes mellitus with diabetic polyneuropathy: Secondary | ICD-10-CM

## 2017-09-17 DIAGNOSIS — E538 Deficiency of other specified B group vitamins: Secondary | ICD-10-CM | POA: Insufficient documentation

## 2017-09-17 DIAGNOSIS — I10 Essential (primary) hypertension: Secondary | ICD-10-CM | POA: Diagnosis present

## 2017-09-17 DIAGNOSIS — R0789 Other chest pain: Secondary | ICD-10-CM | POA: Diagnosis present

## 2017-09-17 DIAGNOSIS — F329 Major depressive disorder, single episode, unspecified: Secondary | ICD-10-CM

## 2017-09-17 DIAGNOSIS — E872 Acidosis, unspecified: Secondary | ICD-10-CM | POA: Diagnosis present

## 2017-09-17 DIAGNOSIS — E785 Hyperlipidemia, unspecified: Secondary | ICD-10-CM | POA: Insufficient documentation

## 2017-09-17 DIAGNOSIS — Z90710 Acquired absence of both cervix and uterus: Secondary | ICD-10-CM

## 2017-09-17 DIAGNOSIS — F411 Generalized anxiety disorder: Secondary | ICD-10-CM

## 2017-09-17 DIAGNOSIS — Z79899 Other long term (current) drug therapy: Secondary | ICD-10-CM

## 2017-09-17 DIAGNOSIS — I2510 Atherosclerotic heart disease of native coronary artery without angina pectoris: Secondary | ICD-10-CM | POA: Insufficient documentation

## 2017-09-17 DIAGNOSIS — K3184 Gastroparesis: Secondary | ICD-10-CM | POA: Diagnosis not present

## 2017-09-17 DIAGNOSIS — E039 Hypothyroidism, unspecified: Secondary | ICD-10-CM | POA: Diagnosis not present

## 2017-09-17 DIAGNOSIS — R918 Other nonspecific abnormal finding of lung field: Secondary | ICD-10-CM | POA: Insufficient documentation

## 2017-09-17 DIAGNOSIS — I129 Hypertensive chronic kidney disease with stage 1 through stage 4 chronic kidney disease, or unspecified chronic kidney disease: Secondary | ICD-10-CM | POA: Insufficient documentation

## 2017-09-17 DIAGNOSIS — K219 Gastro-esophageal reflux disease without esophagitis: Secondary | ICD-10-CM | POA: Diagnosis present

## 2017-09-17 DIAGNOSIS — R112 Nausea with vomiting, unspecified: Secondary | ICD-10-CM | POA: Diagnosis not present

## 2017-09-17 DIAGNOSIS — F419 Anxiety disorder, unspecified: Secondary | ICD-10-CM | POA: Insufficient documentation

## 2017-09-17 DIAGNOSIS — N182 Chronic kidney disease, stage 2 (mild): Secondary | ICD-10-CM | POA: Insufficient documentation

## 2017-09-17 DIAGNOSIS — I429 Cardiomyopathy, unspecified: Secondary | ICD-10-CM | POA: Insufficient documentation

## 2017-09-17 DIAGNOSIS — Z885 Allergy status to narcotic agent status: Secondary | ICD-10-CM

## 2017-09-17 DIAGNOSIS — M810 Age-related osteoporosis without current pathological fracture: Secondary | ICD-10-CM

## 2017-09-17 DIAGNOSIS — Z794 Long term (current) use of insulin: Secondary | ICD-10-CM

## 2017-09-17 DIAGNOSIS — E1122 Type 2 diabetes mellitus with diabetic chronic kidney disease: Secondary | ICD-10-CM | POA: Insufficient documentation

## 2017-09-17 DIAGNOSIS — E1143 Type 2 diabetes mellitus with diabetic autonomic (poly)neuropathy: Secondary | ICD-10-CM | POA: Diagnosis present

## 2017-09-17 DIAGNOSIS — Z9114 Patient's other noncompliance with medication regimen: Secondary | ICD-10-CM

## 2017-09-17 DIAGNOSIS — R748 Abnormal levels of other serum enzymes: Secondary | ICD-10-CM | POA: Diagnosis not present

## 2017-09-17 LAB — BASIC METABOLIC PANEL
Anion gap: 17 — ABNORMAL HIGH (ref 5–15)
BUN: 14 mg/dL (ref 6–20)
CALCIUM: 9.2 mg/dL (ref 8.9–10.3)
CO2: 18 mmol/L — ABNORMAL LOW (ref 22–32)
CREATININE: 0.8 mg/dL (ref 0.44–1.00)
Chloride: 100 mmol/L — ABNORMAL LOW (ref 101–111)
GFR calc Af Amer: >60 mL/min (ref >60)
GLUCOSE: 383 mg/dL — AB (ref 65–99)
POTASSIUM: 3.7 mmol/L (ref 3.5–5.1)
SODIUM: 135 mmol/L (ref 135–145)

## 2017-09-17 LAB — CBG MONITORING, ED
GLUCOSE-CAPILLARY: 389 mg/dL — AB (ref 65–99)
GLUCOSE-CAPILLARY: 336 mg/dL — AB (ref 65–99)
GLUCOSE-CAPILLARY: 298 mg/dL — AB (ref 65–99)

## 2017-09-17 LAB — TROPONIN I

## 2017-09-17 LAB — I-STAT TROPONIN, ED: TROPONIN I, POC: 0 ng/mL (ref 0.00–0.08)

## 2017-09-17 LAB — URINALYSIS, ROUTINE W REFLEX MICROSCOPIC
BILIRUBIN URINE: NEGATIVE
Glucose, UA: >=500 mg/dL — AB
Ketones, ur: 80 mg/dL — AB
Leukocytes, UA: NEGATIVE
Nitrite: NEGATIVE
PH: 6 (ref 5.0–8.0)
Protein, ur: 30 mg/dL — AB
SPECIFIC GRAVITY, URINE: 1.026 (ref 1.005–1.030)

## 2017-09-17 LAB — I-STAT VENOUS BLOOD GAS, ED
Acid-base deficit: 5 mmol/L — ABNORMAL HIGH (ref 0.0–2.0)
Bicarbonate: 19.8 mmol/L — ABNORMAL LOW (ref 20.0–28.0)
O2 SAT: 83 %
PCO2 VEN: 34.7 mmHg — AB (ref 44.0–60.0)
PH VEN: 7.364 (ref 7.250–7.430)
PO2 VEN: 48 mmHg — AB (ref 32.0–45.0)
TCO2: 21 mmol/L — AB (ref 22–32)

## 2017-09-17 LAB — CBC
HEMATOCRIT: 42.2 % (ref 36.0–46.0)
Hemoglobin: 15 g/dL (ref 12.0–15.0)
MCH: 29.5 pg (ref 26.0–34.0)
MCHC: 35.5 g/dL (ref 30.0–36.0)
MCV: 83.1 fL (ref 78.0–100.0)
PLATELETS: 369 10*3/uL (ref 150–400)
RBC: 5.08 MIL/uL (ref 3.87–5.11)
RDW: 13.4 % (ref 11.5–15.5)
WBC: 16.4 10*3/uL — ABNORMAL HIGH (ref 4.0–10.5)

## 2017-09-17 LAB — I-STAT CG4 LACTIC ACID, ED: LACTIC ACID, VENOUS: 2.46 mmol/L — AB (ref 0.5–1.9)

## 2017-09-17 MED ORDER — DIPHENHYDRAMINE HCL 50 MG/ML IJ SOLN
25.0000 mg | Freq: Once | INTRAMUSCULAR | Status: AC
  Administered 2017-09-17: 25 mg via INTRAVENOUS
  Filled 2017-09-17: qty 1

## 2017-09-17 MED ORDER — SERTRALINE HCL 50 MG PO TABS
50.0000 mg | ORAL_TABLET | Freq: Every day | ORAL | Status: DC
  Administered 2017-09-18: 50 mg via ORAL
  Filled 2017-09-17: qty 1

## 2017-09-17 MED ORDER — METOPROLOL SUCCINATE ER 25 MG PO TB24
25.0000 mg | ORAL_TABLET | Freq: Every day | ORAL | Status: DC
  Administered 2017-09-18: 25 mg via ORAL
  Filled 2017-09-17: qty 1

## 2017-09-17 MED ORDER — EZETIMIBE 10 MG PO TABS
10.0000 mg | ORAL_TABLET | Freq: Every day | ORAL | Status: DC
  Administered 2017-09-18: 10 mg via ORAL
  Filled 2017-09-17: qty 1

## 2017-09-17 MED ORDER — LEVOTHYROXINE SODIUM 150 MCG PO TABS
150.0000 ug | ORAL_TABLET | Freq: Every day | ORAL | Status: DC
  Administered 2017-09-18: 150 ug via ORAL
  Filled 2017-09-17: qty 2
  Filled 2017-09-17: qty 1

## 2017-09-17 MED ORDER — SODIUM CHLORIDE 0.9 % IV BOLUS (SEPSIS)
500.0000 mL | Freq: Once | INTRAVENOUS | Status: AC
  Administered 2017-09-17: 500 mL via INTRAVENOUS

## 2017-09-17 MED ORDER — DEXTROSE-NACL 5-0.45 % IV SOLN: INTRAVENOUS | Status: DC

## 2017-09-17 MED ORDER — SODIUM CHLORIDE 0.9 % IV SOLN: INTRAVENOUS | Status: AC

## 2017-09-17 MED ORDER — METOCLOPRAMIDE HCL 5 MG/ML IJ SOLN
5.0000 mg | Freq: Three times a day (TID) | INTRAMUSCULAR | Status: DC
  Administered 2017-09-18 (×2): 5 mg via INTRAVENOUS
  Filled 2017-09-17 (×2): qty 2

## 2017-09-17 MED ORDER — IPRATROPIUM-ALBUTEROL 0.5-2.5 (3) MG/3ML IN SOLN: 3.0000 mL | RESPIRATORY_TRACT | Status: DC | PRN

## 2017-09-17 MED ORDER — PROCHLORPERAZINE EDISYLATE 5 MG/ML IJ SOLN
10.0000 mg | Freq: Once | INTRAMUSCULAR | Status: AC
  Administered 2017-09-17: 10 mg via INTRAVENOUS
  Filled 2017-09-17: qty 2

## 2017-09-17 MED ORDER — ENOXAPARIN SODIUM 40 MG/0.4ML ~~LOC~~ SOLN
40.0000 mg | SUBCUTANEOUS | Status: DC
  Administered 2017-09-18: 40 mg via SUBCUTANEOUS
  Filled 2017-09-17: qty 0.4

## 2017-09-17 MED ORDER — ONDANSETRON HCL 4 MG PO TABS: 4.0000 mg | ORAL_TABLET | Freq: Four times a day (QID) | ORAL | Status: DC | PRN

## 2017-09-17 MED ORDER — ASPIRIN 81 MG PO CHEW
81.0000 mg | CHEWABLE_TABLET | Freq: Every day | ORAL | Status: DC
  Administered 2017-09-18: 81 mg via ORAL
  Filled 2017-09-17: qty 1

## 2017-09-17 MED ORDER — SODIUM CHLORIDE 0.9 % IV SOLN
INTRAVENOUS | Status: DC
  Administered 2017-09-17: 23:00:00 via INTRAVENOUS

## 2017-09-17 MED ORDER — POTASSIUM CHLORIDE 10 MEQ/100ML IV SOLN: 10.0000 meq | INTRAVENOUS | Status: DC

## 2017-09-17 MED ORDER — DEXTROSE-NACL 5-0.45 % IV SOLN
INTRAVENOUS | Status: DC
  Administered 2017-09-18: 01:00:00 via INTRAVENOUS

## 2017-09-17 MED ORDER — SODIUM CHLORIDE 0.9 % IV SOLN
INTRAVENOUS | Status: DC
  Administered 2017-09-17: 22:00:00 via INTRAVENOUS

## 2017-09-17 MED ORDER — SODIUM CHLORIDE 0.9 % IV SOLN: INTRAVENOUS | Status: DC

## 2017-09-17 MED ORDER — POTASSIUM CHLORIDE 10 MEQ/100ML IV SOLN
10.0000 meq | INTRAVENOUS | Status: AC
  Administered 2017-09-17 – 2017-09-18 (×2): 10 meq via INTRAVENOUS
  Filled 2017-09-17 (×2): qty 100

## 2017-09-17 MED ORDER — LOSARTAN POTASSIUM 25 MG PO TABS
25.0000 mg | ORAL_TABLET | Freq: Every day | ORAL | Status: DC
  Administered 2017-09-18: 25 mg via ORAL
  Filled 2017-09-17: qty 1

## 2017-09-17 MED ORDER — ONDANSETRON HCL 4 MG/2ML IJ SOLN: 4.0000 mg | Freq: Four times a day (QID) | INTRAMUSCULAR | Status: DC | PRN

## 2017-09-17 MED ORDER — SODIUM CHLORIDE 0.9 % IV SOLN
INTRAVENOUS | Status: DC
  Administered 2017-09-17: 2.8 [IU]/h via INTRAVENOUS
  Filled 2017-09-17: qty 1

## 2017-09-17 MED ORDER — ATORVASTATIN CALCIUM 80 MG PO TABS: 80.0000 mg | ORAL_TABLET | Freq: Every day | ORAL | Status: DC

## 2017-09-17 NOTE — ED Triage Notes (Signed)
Per EMS, pt c/o n/v since 0430 this am. Shortly after vomiting, pt began experiencing non radiating centralized chest pain. Pt denies shortness of breath. EMS vitals: BP-159/91, HR-110, RR-20, SpO2-98%, CBG-264

## 2017-09-17 NOTE — ED Provider Notes (Signed)
[facility] DEPT Provider Note   CSN: [id number] Arrival date & time: 09/17/17  1930     History   Chief Complaint Chief Complaint  Patient presents with  . Emesis  . Chest Pain    HPI [name] [name] is a 56 y.o. female.  HPI  Started throwing up around 430AM, woke up feeling nauseas.  Has thrown up 22 times today. No blood in emesis.  No diarrhea. Had normal BM earlier, has been constipated, is passing flatus.  No urinary symptoms, no frequency.  Sugars at home in 200s.  No abdominal pain.  Headache started around lunch time, 7/10.  Had a little bit of chest pain, started this afternoon, there all the time since this arternoon, nothing makes it better or worse.  Feels like a dull pain. No dyspnea. Does have history of gastroparesis. Has had similar CP and n/v in the past. Had cath in march which was nonobstructive.    Past Medical History:  Diagnosis Date  . Anxiety   . B12 deficiency   . Chest pain 04/05/2017  . GERD (gastroesophageal reflux disease)   . Hyperlipidemia   . Hypertension    [name]  . Hypothyroidism   . IBS (irritable bowel syndrome)   . Intermittent vertigo   . 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 Leg cramping    "@ night" (09/18/2013)  . Migraine    "once q couple months" (09/18/2013)  . Osteoporosis   . Peripheral neuropathy   . Type II diabetes mellitus ([place])   . Umbilical hernia    unrepaired (09/18/2013)    Patient Active Problem List   Diagnosis Date Noted  . DKA (diabetic ketoacidoses) ([place]) 06/22/2017  . Phrygian cap 06/20/2017  . Lactic acidosis   . Leukocytosis   . Lower abdominal pain   . Nonischemic cardiomyopathy ([place]) 03/22/2017  . NSTEMI (non-ST elevated myocardial infarction) ([place]) 02/11/2017  . H. pylori infection 02/09/2017  . Gastroparesis due to DM ([place]) 01/22/2017  . Type 2 diabetes mellitus with ketoacidosis without coma, with long-term current use of insulin ([place]) 07/17/2016  . Atrial  fibrillation ([place]) 02/01/2015  . 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 Hepatic lesion 09/18/2013  . Vitamin D deficiency 05/20/2012  . Essential hypertension 02/01/2011  . Osteoporosis 02/01/2011  . B12 deficiency 04/04/2010  . Peripheral neuropathy ([place]) 05/10/2009  . GERD 04/08/2009  . Generalized anxiety disorder 06/22/2008  . Hypothyroidism, acquired 04/29/2008  . Hyperlipidemia 04/29/2008  . Irritable bowel syndrome 04/29/2008    Past Surgical History:  Procedure Laterality Date  . CESAREAN SECTION  1989  . LAPAROSCOPIC ASSISTED VAGINAL HYSTERECTOMY  09/23/2012   Procedure: LAPAROSCOPIC ASSISTED VAGINAL HYSTERECTOMY;  Surgeon: Linda Hedges, DO;  Location: [facility];  Service: Gynecology;  Laterality: N/A;  pull Dr Gregor Hams instrument  . LEFT HEART CATH AND CORONARY ANGIOGRAPHY N/A 02/11/2017   Procedure: Left Heart Cath and Coronary Angiography;  Surgeon: Lorretta Harp, MD;  Location: [facility];  Service: Cardiovascular;  Laterality: N/A;  . TUBAL LIGATION Bilateral 1992  . VAGINAL HYSTERECTOMY  2013    OB History    Gravida Para Term Preterm AB Living   2 2           SAB TAB Ectopic Multiple Live Births                   Home Medications    Prior to Admission medications   Medication  Sig Start Date End Date Taking? Authorizing Provider  aspirin 81 MG chewable tablet Chew 1 tablet (81 mg total) by mouth daily. 02/12/17   Jani Gravel, MD  atorvastatin (LIPITOR) 80 MG tablet Take 1 tablet (80 mg total) by mouth daily at 6 PM. 09/04/16   Bedsole, Amy E, MD  cetirizine (ZYRTEC) 10 MG tablet Take 1 tablet (10 mg total) by mouth daily. 08/05/17   Bedsole, Amy E, MD  Continuous Blood Gluc Receiver (FREESTYLE LIBRE READER) DEVI 1 Device by Does not apply route 3 (three) times daily. 04/03/17   Philemon Kingdom, MD  Continuous Blood Gluc Sensor (FREESTYLE LIBRE SENSOR SYSTEM) MISC 1 Device by Does not apply route every 30 (thirty) days. 04/03/17    Philemon Kingdom, MD  ezetimibe (ZETIA) 10 MG tablet Take 1 tablet (10 mg total) by mouth daily. 09/11/16   Bedsole, Amy E, MD  glucose blood (ONETOUCH VERIO) test strip Use to check blood sugar daily as needed.  Dx: E11.10 06/17/17   Jinny Sanders, MD  Insulin Glargine (BASAGLAR KWIKPEN) 100 UNIT/ML SOPN Inject 0.45 mLs (45 Units total) into the skin daily at 10 pm. INJECT 55 UNITS INTO THE SKIN DAILY AT 10 PM. 04/08/17   Reyne Dumas, MD  insulin lispro (HUMALOG KWIKPEN) 100 UNIT/ML KiwkPen Inject 0.08 mLs (8 Units total) into the skin 3 (three) times daily. Patient taking differently: Inject 12 Units into the skin 3 (three) times daily.  04/08/17   Reyne Dumas, MD  levothyroxine (SYNTHROID, LEVOTHROID) 150 MCG tablet Take 1 tablet (150 mcg total) by mouth daily. 04/03/17   Philemon Kingdom, MD  losartan (COZAAR) 25 MG tablet Take 25 mg by mouth daily. 05/27/17   [provider]  metoCLOPramide (REGLAN) 5 MG tablet TAKE 1 TABLET BY MOUTH 4 TIMES A DAY BEFORE MEALS AND AT BEDTIME 09/10/17   Bedsole, Amy E, MD  metoprolol succinate (TOPROL-XL) 25 MG 24 hr tablet Take 25 mg by mouth daily. 03/24/17   [provider]  ondansetron (ZOFRAN) 4 MG tablet TAKE 1 TABLET BY MOUTH EVERY 6 HOURS AS NEEDED FOR NAUSEA AND VOMITING 08/16/17   Bedsole, Amy E, MD  RELPAX 40 MG tablet ONE TABLET BY MOUTH AT ONSET OF HEADACHE. MAY REPEAT IN 2 HOURS IF HEADACHE PERSISTS OR RECURS. 10/17/16   Bedsole, Amy E, MD  sertraline (ZOLOFT) 50 MG tablet Take 1 tablet (50 mg total) by mouth daily. 09/02/17   Jinny Sanders, MD    Family History Family History  Problem Relation Age of Onset  . Diabetes Other   . Heart disease Other   . Heart failure Mother   . Diabetes Maternal Grandmother   . Alzheimer's disease Maternal Grandmother   . Coronary artery disease Maternal Grandmother   . Heart attack Maternal Grandfather   . Heart failure Brother     Social History Social History  Substance Use Topics  .  Smoking status: Former Smoker    Years: 0.20    Types: Cigarettes    Quit date: 12/10/1990  . Smokeless tobacco: Never Used  . Alcohol use No     Allergies   Codeine   Review of Systems Review of Systems  Constitutional: Positive for fatigue. Negative for fever.  HENT: Positive for congestion. Negative for sore throat.   Eyes: Negative for visual disturbance.  Respiratory: Positive for cough (started this morning). Negative for shortness of breath.   Cardiovascular: Positive for chest pain.  Gastrointestinal: Positive for nausea and vomiting. Negative for  abdominal pain, constipation and diarrhea.  Genitourinary: Negative for difficulty urinating and dysuria.  Skin: Negative for rash.  Neurological: Positive for headaches.     Physical Exam Updated Vital Signs BP (!) 153/91   Pulse (!) 109   Temp 97.6 F (36.4 C) (Oral)   Resp 20   Ht 4\' 11"  (1.499 m)   Wt 57.6 kg (127 lb)   LMP 08/11/2012   SpO2 100%   BMI 25.65 kg/m   Physical Exam  Constitutional: She is oriented to person, place, and time. She appears well-developed and well-nourished. She appears ill. No distress.  HENT:  Head: Normocephalic and atraumatic.  Eyes: Conjunctivae and EOM are normal.  Neck: Normal range of motion.  Cardiovascular: Regular rhythm, normal heart sounds and intact distal pulses.  Tachycardia present.  Exam reveals no gallop and no friction rub.   No murmur heard. Pulmonary/Chest: Effort normal and breath sounds normal. No respiratory distress. She has no wheezes. She has no rales.  Abdominal: Soft. She exhibits no distension. There is no tenderness. There is no guarding.  Musculoskeletal: She exhibits no edema or tenderness.  Neurological: She is alert and oriented to person, place, and time.  Skin: Skin is warm and dry. No rash noted. She is not diaphoretic. No erythema.  Nursing note and vitals reviewed.    ED Treatments / Results  Labs (all labs ordered are listed, but only  abnormal results are displayed) Labs Reviewed  BASIC METABOLIC PANEL - Abnormal; Notable for the following:       Result Value   Chloride 100 (*)    CO2 18 (*)    Glucose, Bld 383 (*)    Anion gap 17 (*)    All other components within normal limits  CBC - Abnormal; Notable for the following:    WBC 16.4 (*)    All other components within normal limits  URINALYSIS, ROUTINE W REFLEX MICROSCOPIC - Abnormal; Notable for the following:    APPearance CLOUDY (*)    Glucose, UA >=500 (*)    Hgb urine dipstick SMALL (*)    Ketones, ur 80 (*)    Protein, ur 30 (*)    Bacteria, UA FEW (*)    Squamous Epithelial / LPF TOO NUMEROUS TO COUNT (*)    All other components within normal limits  CBG MONITORING, ED - Abnormal; Notable for the following:    Glucose-Capillary 389 (*)    All other components within normal limits  I-STAT VENOUS BLOOD GAS, ED - Abnormal; Notable for the following:    pCO2, Ven 34.7 (*)    pO2, Ven 48.0 (*)    Bicarbonate 19.8 (*)    TCO2 21 (*)    Acid-base deficit 5.0 (*)    All other components within normal limits  I-STAT CG4 LACTIC ACID, ED - Abnormal; Notable for the following:    Lactic Acid, Venous 2.46 (*)    All other components within normal limits  CBG MONITORING, ED - Abnormal; Notable for the following:    Glucose-Capillary 336 (*)    All other components within normal limits  LIPASE, BLOOD  HEPATIC FUNCTION PANEL  I-STAT TROPONIN, ED    EKG  EKG Interpretation  Date/Time:  Tuesday September 17 2017 19:40:10 EDT Ventricular Rate:  113 PR Interval:    QRS Duration: 84 QT Interval:  352 QTC Calculation: 483 R Axis:   88 Text Interpretation:  Sinus tachycardia Since prior ECG, rate has increased, no other significant changes Confirmed by  Gareth Morgan [phone]) on 09/17/2017 8:17:36 PM       Radiology Dg Chest 2 View  Result Date: 09/17/2017 CLINICAL DATA:  Generalized chest pain, shortness of breath, nausea, and vomiting for 1 day, history  hypertension, diabetes mellitus, former smoker EXAM: CHEST  2 VIEW COMPARISON:  06/22/2017 FINDINGS: Normal heart size, mediastinal contours, and pulmonary vascularity. Lungs hyperinflated but clear. No pulmonary infiltrate, pleural effusion or pneumothorax. Numerous EKG leads project over chest particularly RIGHT lung. No acute osseous findings. IMPRESSION: Hyperinflated lungs without infiltrate. Electronically Signed   By: Lavonia Dana M.D.   On: 09/17/2017 20:36    Procedures Procedures (including critical care time)  Medications Ordered in ED Medications  insulin regular (NOVOLIN R,HUMULIN R) 100 Units in sodium chloride 0.9 % 100 mL (1 Units/mL) infusion (2.8 Units/hr Intravenous New Bag/Given 09/17/17 2141)  0.9 %  sodium chloride infusion ( Intravenous New Bag/Given 09/17/17 2142)  dextrose 5 %-0.45 % sodium chloride infusion (not administered)  potassium chloride 10 mEq in 100 mL IVPB (not administered)  sodium chloride 0.9 % bolus 500 mL (0 mLs Intravenous Stopped 09/17/17 2114)  prochlorperazine (COMPAZINE) injection 10 mg (10 mg Intravenous Given 09/17/17 2108)  sodium chloride 0.9 % bolus 500 mL (500 mLs Intravenous New Bag/Given 09/17/17 2112)  diphenhydrAMINE (BENADRYL) injection 25 mg (25 mg Intravenous Given 09/17/17 2108)     Initial Impression / Assessment and Plan / ED Course  I have reviewed the triage vital signs and the nursing notes.  Pertinent labs & imaging results that were available during my care of the patient were reviewed by me and considered in my medical decision making (see chart for details).      56 year old female with a history of diabetes, hypertension, hyperlipidemia, gastroparesis, N STEMI, nonischemic cardiomyopathy with most recent cath radiation in March 2018, presents with concern for nausea and vomiting. Patient also reports that she developed a headache and chest pain later on in the day. No trauma, no other neurologic findings, slow onset of  headache, and have low suspicion for subarachnoid hemorrhage, subdural hemorrhage, meningitis. Suspect this is likely secondary to dehydration.  Patient with chest pain that developed later this afternoon after emesis, normal troponin. EKG with sinus tachycardia likely secondary to dehydration.  At this time, doubt ACS, PE, dissection, boerhaave's. Had recent nonobstructive catheterization. Abdominal exam benign, doubt acute intraabdominal pathology. Pt passing flatus, having BM, doubt obstruction. Suspect emesis secondary to gastroparesis and DKA. No sign of UTI. Pt does have URI symptoms, no sign of pneumonia.   Labs significant for mild DKA with bicarb 18, AG 17, ketones in urine. PH remains normal.  Lactic acidosis likely secondary to dehydration. No sign of infection.  Given 1L of NS and placed on 125cc/hr.  Initiated insulin gtt for mild DKA.    Final Clinical Impressions(s) / ED Diagnoses   Final diagnoses:  Diabetic ketoacidosis without coma associated with type 2 diabetes mellitus ([place])  Non-intractable vomiting with nausea, unspecified vomiting type    New Prescriptions New Prescriptions   No medications on file     Gareth Morgan, MD 09/17/17 2149

## 2017-09-17 NOTE — H&P (Signed)
History and Physical    [name] [name]:[id number] DOB: 21-Mar-1961 DOA: 09/17/2017  Referring MD/NP/PA: Dr. Gareth [name] PCP: [name] Sanders, MD  Patient coming from: Home  Chief Complaint: Nausea and vomiting  HPI: [name] [name] is a 56 y.o. female with medical history significant of IDDM with complications of gastroparesis, frequent DKA, NICM EF 55% in 05/2017, HTN, HLD, and hypothyroidism; presents with complaints of nausea and vomiting since 4:30 AM this morning. Patient reports having up to 25 episodes of nonbloody emesis. Associated symptoms include mild dull abdominal pain with vomiting, mild cough, mild chest pain, and headache. Her last bowel movement was prior to arrival. Patient tried taking Zofran with mild relief, but when symptoms returned felt that she would be unable to keep down the Zofran and therefore came to the emergency department. She otherwise reports that she's been taking all medications as prescribed including Reglan. Review of records shows patient has been admitted multiple times in the past with the same with her last admission back in 06/2017. Denies any significant fever, chills, sick contacts, dysuria, diarrhea, or recent sick contacts.  ED Course: Upon admission into the emergency department patient was noted to be afebrile, pulse 106-115, respirations 20-24 , and all other vital signs maintained. Labs revealed WBC 16.4, sodium 135, potassium 3.7, chloride 100, CO2 18, BUN 14, creatinine 0.8, glucose 383, anion gap 17, and lactic acid 2.46. Patient was started glucose stabilizer per protocol.  Review of Systems  Constitutional: Positive for malaise/fatigue. Negative for chills and fever.  HENT: Negative for ear discharge and nosebleeds.   Eyes: Negative for photophobia and pain.  Respiratory: Positive for cough. Negative for sputum production, shortness of breath and wheezing.   Cardiovascular: Positive for chest pain. Negative for leg swelling.    Gastrointestinal: Positive for abdominal pain, nausea and vomiting. Negative for blood in stool and diarrhea.  Genitourinary: Positive for frequency. Negative for dysuria.  Musculoskeletal: Negative for falls and joint pain.  Skin: Negative for itching and rash.  Neurological: Negative for seizures and loss of consciousness.  Endo/Heme/Allergies: Positive for polydipsia.  Psychiatric/Behavioral: Negative for hallucinations and substance abuse.    Past Medical History:  Diagnosis Date  . Anxiety   . B12 deficiency   . Chest pain 04/05/2017  . GERD (gastroesophageal reflux disease)   . Hyperlipidemia   . Hypertension    [name]  . Hypothyroidism   . IBS (irritable bowel syndrome)   . Intermittent vertigo   . 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 Leg cramping    "@ night" (09/18/2013)  . Migraine    "once q couple months" (09/18/2013)  . Osteoporosis   . Peripheral neuropathy   . Type II diabetes mellitus ([place])   . Umbilical hernia    unrepaired (09/18/2013)    Past Surgical History:  Procedure Laterality Date  . CESAREAN SECTION  1989  . LAPAROSCOPIC ASSISTED VAGINAL HYSTERECTOMY  09/23/2012   Procedure: LAPAROSCOPIC ASSISTED VAGINAL HYSTERECTOMY;  Surgeon: [name] Hedges, DO;  Location: [facility];  Service: Gynecology;  Laterality: N/A;  pull [name] [name] instrument  . LEFT HEART CATH AND CORONARY ANGIOGRAPHY N/A 02/11/2017   Procedure: Left Heart Cath and Coronary Angiography;  Surgeon: [name] Harp, MD;  Location: [facility];  Service: Cardiovascular;  Laterality: N/A;  . TUBAL LIGATION Bilateral 1992  . VAGINAL HYSTERECTOMY  2013     reports that she quit smoking about 26 years ago. Her smoking use included Cigarettes.  She quit after 0.20 years of use. She has never used smokeless tobacco. She reports that she uses drugs, including Marijuana, about 3 times per week. She reports that she does not drink alcohol.  Allergies  Allergen  Reactions  . Codeine Hives, Anxiety and Other (See Comments)    Family History  Problem Relation Age of Onset  . Diabetes Other   . Heart disease Other   . Heart failure Mother   . Diabetes Maternal Grandmother   . Alzheimer's disease Maternal Grandmother   . Coronary artery disease Maternal Grandmother   . Heart attack Maternal Grandfather   . Heart failure Brother     Prior to Admission medications   Medication Sig Start Date End Date Taking? Authorizing Provider  aspirin 81 MG chewable tablet Chew 1 tablet (81 mg total) by mouth daily. 02/12/17   [name] Gravel, MD  atorvastatin (LIPITOR) 80 MG tablet Take 1 tablet (80 mg total) by mouth daily at 6 PM. 09/04/16   Bedsole, Amy E, MD  cetirizine (ZYRTEC) 10 MG tablet Take 1 tablet (10 mg total) by mouth daily. 08/05/17   Bedsole, Amy E, MD  Continuous Blood Gluc Receiver (FREESTYLE LIBRE READER) DEVI 1 Device by Does not apply route 3 (three) times daily. 04/03/17   [name] Kingdom, MD  Continuous Blood Gluc Sensor (FREESTYLE LIBRE SENSOR SYSTEM) MISC 1 Device by Does not apply route every 30 (thirty) days. 04/03/17   [name] Kingdom, MD  ezetimibe (ZETIA) 10 MG tablet Take 1 tablet (10 mg total) by mouth daily. 09/11/16   Bedsole, Amy E, MD  glucose blood (ONETOUCH VERIO) test strip Use to check blood sugar daily as needed.  Dx: E11.10 06/17/17   [name] Sanders, MD  Insulin Glargine (BASAGLAR KWIKPEN) 100 UNIT/ML SOPN Inject 0.45 mLs (45 Units total) into the skin daily at 10 pm. INJECT 55 UNITS INTO THE SKIN DAILY AT 10 PM. 04/08/17   [name] Dumas, MD  insulin lispro (HUMALOG KWIKPEN) 100 UNIT/ML KiwkPen Inject 0.08 mLs (8 Units total) into the skin 3 (three) times daily. Patient taking differently: Inject 12 Units into the skin 3 (three) times daily.  04/08/17   [name] Dumas, MD  levothyroxine (SYNTHROID, LEVOTHROID) 150 MCG tablet Take 1 tablet (150 mcg total) by mouth daily. 04/03/17   [name] Kingdom, MD  losartan (COZAAR) 25 MG tablet  Take 25 mg by mouth daily. 05/27/17   [provider]  metoCLOPramide (REGLAN) 5 MG tablet TAKE 1 TABLET BY MOUTH 4 TIMES A DAY BEFORE MEALS AND AT BEDTIME 09/10/17   Bedsole, Amy E, MD  metoprolol succinate (TOPROL-XL) 25 MG 24 hr tablet Take 25 mg by mouth daily. 03/24/17   [provider]  ondansetron (ZOFRAN) 4 MG tablet TAKE 1 TABLET BY MOUTH EVERY 6 HOURS AS NEEDED FOR NAUSEA AND VOMITING 08/16/17   Bedsole, Amy E, MD  RELPAX 40 MG tablet ONE TABLET BY MOUTH AT ONSET OF HEADACHE. MAY REPEAT IN 2 HOURS IF HEADACHE PERSISTS OR RECURS. 10/17/16   Bedsole, Amy E, MD  sertraline (ZOLOFT) 50 MG tablet Take 1 tablet (50 mg total) by mouth daily. 09/02/17   [name] Sanders, MD    Physical Exam:  Constitutional: Older female who appears to be sick appearing. Vitals:   09/17/17 2000 09/17/17 2030 09/17/17 2045 09/17/17 2100  BP: (!) 153/91 (!) 143/88  (!) 153/91  Pulse: (!) 110  (!) 106 (!) 109  Resp: 20     Temp:  TempSrc:      SpO2: 100%  100% 100%  Weight:      Height:       Eyes: PERRL, lids and conjunctivae normal ENMT: Mucous membranes are dry. Posterior pharynx clear of any exudate or lesions.Normal dentition.  Neck: normal, supple, no masses, no thyromegaly Respiratory: clear to auscultation bilaterally, no wheezing, no crackles. Normal respiratory effort. No accessory muscle use.  Cardiovascular: Tachycardic, no murmurs / rubs / gallops. No extremity edema. 2+ pedal pulses. No carotid bruits.  Abdomen: no tenderness, no masses palpated. No hepatosplenomegaly. Bowel sounds positive.  Musculoskeletal: no clubbing / cyanosis. No joint deformity upper and lower extremities. Good ROM, no contractures. Normal muscle tone.  Skin: no rashes, lesions, ulcers. No induration Neurologic: CN 2-12 grossly intact. Sensation intact, DTR normal. Strength 5/5 in all 4.  Psychiatric: Normal judgment and insight. Alert and oriented x 3. Normal mood.     Labs on Admission: I have  personally reviewed following labs and imaging studies  CBC:  Recent Labs Lab 09/17/17 1937  WBC 16.4*  HGB 15.0  HCT 42.2  MCV 83.1  PLT 226   Basic Metabolic Panel:  Recent Labs Lab 09/17/17 1937  NA 135  K 3.7  CL 100*  CO2 18*  GLUCOSE 383*  BUN 14  CREATININE 0.80  CALCIUM 9.2   GFR: Estimated Creatinine Clearance: 62.2 mL/min (by C-G formula based on SCr of 0.8 mg/dL). Liver Function Tests: No results for input(s): AST, ALT, ALKPHOS, BILITOT, PROT, ALBUMIN in the last 168 hours. No results for input(s): LIPASE, AMYLASE in the last 168 hours. No results for input(s): AMMONIA in the last 168 hours. Coagulation Profile: No results for input(s): INR, PROTIME in the last 168 hours. Cardiac Enzymes: No results for input(s): CKTOTAL, CKMB, CKMBINDEX, TROPONINI in the last 168 hours. BNP (last 3 results) No results for input(s): PROBNP in the last 8760 hours. HbA1C: No results for input(s): HGBA1C in the last 72 hours. CBG:  Recent Labs Lab 09/17/17 1937 09/17/17 2135  GLUCAP 389* 336*   Lipid Profile: No results for input(s): CHOL, HDL, LDLCALC, TRIG, CHOLHDL, LDLDIRECT in the last 72 hours. Thyroid Function Tests: No results for input(s): TSH, T4TOTAL, FREET4, T3FREE, THYROIDAB in the last 72 hours. Anemia Panel: No results for input(s): VITAMINB12, FOLATE, FERRITIN, TIBC, IRON, RETICCTPCT in the last 72 hours. Urine analysis:    Component Value Date/Time   COLORURINE YELLOW 09/17/2017 2054   APPEARANCEUR CLOUDY (A) 09/17/2017 2054   LABSPEC 1.026 09/17/2017 2054   PHURINE 6.0 09/17/2017 2054   GLUCOSEU >=500 (A) 09/17/2017 2054   HGBUR SMALL (A) 09/17/2017 2054   BILIRUBINUR NEGATIVE 09/17/2017 2054   BILIRUBINUR negative 09/11/2016 1038   KETONESUR 80 (A) 09/17/2017 2054   PROTEINUR 30 (A) 09/17/2017 2054   UROBILINOGEN 0.2 09/11/2016 1038   UROBILINOGEN 0.2 10/28/2013 1723   NITRITE NEGATIVE 09/17/2017 2054   LEUKOCYTESUR NEGATIVE 09/17/2017  2054   Sepsis Labs: No results found for this or any previous visit (from the past 240 hour(s)).   Radiological Exams on Admission: Dg Chest 2 View  Result Date: 09/17/2017 CLINICAL DATA:  Generalized chest pain, shortness of breath, nausea, and vomiting for 1 day, history hypertension, diabetes mellitus, former smoker EXAM: CHEST  2 VIEW COMPARISON:  06/22/2017 FINDINGS: Normal heart size, mediastinal contours, and pulmonary vascularity. Lungs hyperinflated but clear. No pulmonary infiltrate, pleural effusion or pneumothorax. Numerous EKG leads project over chest particularly RIGHT lung. No acute osseous findings. IMPRESSION: Hyperinflated lungs without  infiltrate. Electronically Signed   By: Lavonia Dana M.D.   On: 09/17/2017 20:36    EKG: Independently reviewed. Sinus tachycardia 113 bpm  Assessment/Plan DKA, type II: Acute. Patient with multiple admissions for the same. Initial blood glucose 383 with anion gap of 17. Urinalysis positive for ketones.Venous pH within normal limits. Last hemoglobin A1c noted to be 10.8 on 04/05/2017. - Admit to stepdown bed - Glucose stabilizer protocol initiated  - Serial BMPs - Correct electrolytes as needed - Monitoring for AG closure and will transition to subcutaneous insulin once able - Diabetes education  Nausea and vomiting, H/O gastroparesis: Patient given Compazine and Benadryl in the ED with some improvement in symptoms - Antiemetics prn   - Reglan IV  Metabolic acidosis with anion gap: As seen above.  Lactic acidosis: Acute. Initial lactic acid 2.46. Urinalysis was negative for any signs of infection and chest x-ray shows no clear infiltrate. - Trend lactic acid level - We'll monitor off antibiotics, but will reevaluated as needed  SIRS: Acute. Patient has elevated leukocytosis of 16.4, but review of records shows as this is more so chronic in nature. Tachycardia and tachypnea can be secondary but currently there is no clear signs of  infection.  Chest discomfort: EKG shows no significant signs of ischemia and initial troponin negative. - Trend troponins  CAD - Continue aspirin, Lipitor, zetia   Hypothyroidism - Continue Synthroid  Hypertension - Continue metoprolol, Cozaar  Depression - Continue Zoloft   DVT prophylaxis: lovenox Code Status: full Family Communication: No family present at bedside Disposition Plan: Likely discharge home once medically stable Consults called: none Admission status: Observation  [name] MD [facility] Pager [phone]   If 7PM-7AM, please contact night-coverage [email or web address] Password [email or web address]  09/17/2017, 9:50 PM

## 2017-09-18 DIAGNOSIS — R0789 Other chest pain: Secondary | ICD-10-CM | POA: Diagnosis not present

## 2017-09-18 DIAGNOSIS — I10 Essential (primary) hypertension: Secondary | ICD-10-CM | POA: Diagnosis not present

## 2017-09-18 DIAGNOSIS — K219 Gastro-esophageal reflux disease without esophagitis: Secondary | ICD-10-CM | POA: Diagnosis not present

## 2017-09-18 DIAGNOSIS — E1110 Type 2 diabetes mellitus with ketoacidosis without coma: Secondary | ICD-10-CM | POA: Diagnosis not present

## 2017-09-18 LAB — GLUCOSE, CAPILLARY
GLUCOSE-CAPILLARY: 122 mg/dL — AB (ref 65–99)
GLUCOSE-CAPILLARY: 143 mg/dL — AB (ref 65–99)
GLUCOSE-CAPILLARY: 147 mg/dL — AB (ref 65–99)
GLUCOSE-CAPILLARY: 149 mg/dL — AB (ref 65–99)
GLUCOSE-CAPILLARY: 170 mg/dL — AB (ref 65–99)
GLUCOSE-CAPILLARY: 187 mg/dL — AB (ref 65–99)
Glucose-Capillary: 124 mg/dL — ABNORMAL HIGH (ref 65–99)
Glucose-Capillary: 135 mg/dL — ABNORMAL HIGH (ref 65–99)
Glucose-Capillary: 141 mg/dL — ABNORMAL HIGH (ref 65–99)
Glucose-Capillary: 155 mg/dL — ABNORMAL HIGH (ref 65–99)
Glucose-Capillary: 226 mg/dL — ABNORMAL HIGH (ref 65–99)
Glucose-Capillary: 231 mg/dL — ABNORMAL HIGH (ref 65–99)

## 2017-09-18 LAB — CBC WITH DIFFERENTIAL/PLATELET
BASOS ABS: 0 10*3/uL (ref 0.0–0.1)
Basophils Relative: 0 %
Eosinophils Absolute: 0 10*3/uL (ref 0.0–0.7)
Eosinophils Relative: 0 %
HEMATOCRIT: 36.2 % (ref 36.0–46.0)
HEMOGLOBIN: 12.4 g/dL (ref 12.0–15.0)
LYMPHS ABS: 3.4 10*3/uL (ref 0.7–4.0)
LYMPHS PCT: 27 %
MCH: 28.4 pg (ref 26.0–34.0)
MCHC: 34.3 g/dL (ref 30.0–36.0)
MCV: 83 fL (ref 78.0–100.0)
Monocytes Absolute: 1.3 10*3/uL — ABNORMAL HIGH (ref 0.1–1.0)
Monocytes Relative: 10 %
NEUTROS ABS: 8 10*3/uL — AB (ref 1.7–7.7)
Neutrophils Relative %: 63 %
Platelets: 278 10*3/uL (ref 150–400)
RBC: 4.36 MIL/uL (ref 3.87–5.11)
RDW: 13.7 % (ref 11.5–15.5)
WBC: 12.7 10*3/uL — AB (ref 4.0–10.5)

## 2017-09-18 LAB — MRSA PCR SCREENING: MRSA BY PCR: NEGATIVE

## 2017-09-18 LAB — BASIC METABOLIC PANEL
ANION GAP: 11 (ref 5–15)
ANION GAP: 13 (ref 5–15)
ANION GAP: 7 (ref 5–15)
BUN: 12 mg/dL (ref 6–20)
BUN: 11 mg/dL (ref 6–20)
BUN: 11 mg/dL (ref 6–20)
CALCIUM: 8.7 mg/dL — AB (ref 8.9–10.3)
CALCIUM: 8.8 mg/dL — AB (ref 8.9–10.3)
CHLORIDE: 105 mmol/L (ref 101–111)
CHLORIDE: 106 mmol/L (ref 101–111)
CHLORIDE: 110 mmol/L (ref 101–111)
CO2: 20 mmol/L — AB (ref 22–32)
CO2: 22 mmol/L (ref 22–32)
CO2: 24 mmol/L (ref 22–32)
Calcium: 8.8 mg/dL — ABNORMAL LOW (ref 8.9–10.3)
Creatinine, Ser: 0.6 mg/dL (ref 0.44–1.00)
Creatinine, Ser: 0.55 mg/dL (ref 0.44–1.00)
Creatinine, Ser: 0.52 mg/dL (ref 0.44–1.00)
GFR calc non Af Amer: >60 mL/min (ref >60)
GFR calc non Af Amer: >60 mL/min (ref >60)
GFR calc non Af Amer: >60 mL/min (ref >60)
GLUCOSE: 153 mg/dL — AB (ref 65–99)
GLUCOSE: 171 mg/dL — AB (ref 65–99)
Glucose, Bld: 121 mg/dL — ABNORMAL HIGH (ref 65–99)
POTASSIUM: 3.2 mmol/L — AB (ref 3.5–5.1)
Potassium: 3 mmol/L — ABNORMAL LOW (ref 3.5–5.1)
Potassium: 3.2 mmol/L — ABNORMAL LOW (ref 3.5–5.1)
Sodium: 141 mmol/L (ref 135–145)
Sodium: 139 mmol/L (ref 135–145)
Sodium: 138 mmol/L (ref 135–145)

## 2017-09-18 LAB — HEMOGLOBIN A1C
Hgb A1c MFr Bld: 10.2 % — ABNORMAL HIGH (ref 4.8–5.6)
MEAN PLASMA GLUCOSE: 246.04 mg/dL

## 2017-09-18 LAB — HEPATIC FUNCTION PANEL
ALK PHOS: 57 U/L (ref 38–126)
ALT: 12 U/L — ABNORMAL LOW (ref 14–54)
AST: 17 U/L (ref 15–41)
Albumin: 3.4 g/dL — ABNORMAL LOW (ref 3.5–5.0)
BILIRUBIN TOTAL: 0.5 mg/dL (ref 0.3–1.2)
Total Protein: 5.9 g/dL — ABNORMAL LOW (ref 6.5–8.1)

## 2017-09-18 LAB — TROPONIN I
Troponin I: <0.03 ng/mL (ref <0.03)
Troponin I: <0.03 ng/mL (ref <0.03)

## 2017-09-18 LAB — LIPASE, BLOOD: Lipase: 20 U/L (ref 11–51)

## 2017-09-18 MED ORDER — INSULIN ASPART 100 UNIT/ML ~~LOC~~ SOLN
3.0000 [IU] | Freq: Three times a day (TID) | SUBCUTANEOUS | Status: DC
  Administered 2017-09-18: 3 [IU] via SUBCUTANEOUS

## 2017-09-18 MED ORDER — SODIUM CHLORIDE 0.9 % IV SOLN
INTRAVENOUS | Status: DC
  Administered 2017-09-18: 11:00:00 via INTRAVENOUS

## 2017-09-18 MED ORDER — METOCLOPRAMIDE HCL 5 MG PO TABS: ORAL_TABLET | ORAL | 0 refills | Status: DC

## 2017-09-18 MED ORDER — POTASSIUM CHLORIDE CRYS ER 20 MEQ PO TBCR
40.0000 meq | EXTENDED_RELEASE_TABLET | Freq: Once | ORAL | Status: AC
Start: 2017-09-18 — End: 2017-09-18
  Administered 2017-09-18: 40 meq via ORAL
  Filled 2017-09-18: qty 2

## 2017-09-18 MED ORDER — INSULIN ASPART 100 UNIT/ML ~~LOC~~ SOLN
0.0000 [IU] | Freq: Three times a day (TID) | SUBCUTANEOUS | Status: DC
  Administered 2017-09-18: 1 [IU] via SUBCUTANEOUS

## 2017-09-18 MED ORDER — INSULIN GLARGINE 100 UNIT/ML ~~LOC~~ SOLN
20.0000 [IU] | Freq: Every day | SUBCUTANEOUS | Status: DC
  Administered 2017-09-18: 20 [IU] via SUBCUTANEOUS
  Filled 2017-09-18: qty 0.2

## 2017-09-18 MED ORDER — INSULIN ASPART 100 UNIT/ML ~~LOC~~ SOLN: 0.0000 [IU] | Freq: Every day | SUBCUTANEOUS | Status: DC

## 2017-09-18 MED ORDER — ONDANSETRON HCL 4 MG PO TABS: ORAL_TABLET | ORAL | 0 refills | Status: DC

## 2017-09-18 MED ORDER — INSULIN LISPRO 100 UNIT/ML (KWIKPEN): 8.0000 [IU] | PEN_INJECTOR | Freq: Three times a day (TID) | SUBCUTANEOUS | 4 refills | Status: DC

## 2017-09-18 MED ORDER — INSULIN GLARGINE 100 UNIT/ML ~~LOC~~ SOLN: 35.0000 [IU] | Freq: Every day | SUBCUTANEOUS | Status: DC

## 2017-09-18 MED ORDER — BASAGLAR KWIKPEN 100 UNIT/ML ~~LOC~~ SOPN: 30.0000 [IU] | PEN_INJECTOR | Freq: Every day | SUBCUTANEOUS | 1 refills | Status: DC

## 2017-09-18 NOTE — Progress Notes (Signed)
Received page from RN stating that MD would like recommendations for d/c medication.  Patients home medications were:   Outpatient Diabetes medications: Basaglar 45 units q HS, Humalog 12 units tid with meals, Freestyle Libre sensor- not currently on patient  He last visit was 4/18 with Dr Cruzita Lederer and Nancee Liter was increased to 55 units daily and Hunalog was increased as well.  Note that per patient report, she was not taking insulin consistently at home.    Consider Lantus 30 units daily and Novolog 8 units tid with meals (to be held if patient does not eat).  She needs f/u with Dr. Cruzita Lederer asap as patient is interested in restarting V-Go to eliminate need for multiple shots.  Text page sent to MD.   Thanks, Adah Perl, RN, BC-ADM Inpatient Diabetes Coordinator Pager [phone] (8a-5p)

## 2017-09-18 NOTE — Progress Notes (Signed)
Discharge Note. Reviewed with the patient all medications. Reminded patient importance of taking insulin and going to follow up appointments. Also reviewed with the patient a carbohydrate modified. Patient had asked for apple juice and was offered diet soda or water. Encouraged patient to follow prescribed diet. Concerned that patient will not be compliant with diet or insulin. Reviewed all discharge instructions with patient. Patient is going to be taken to car via wheelchair by NT.

## 2017-09-18 NOTE — Progress Notes (Addendum)
Inpatient Diabetes Program Recommendations  AACE/ADA: New Consensus Statement on Inpatient Glycemic Control (2015)  Target Ranges:  Prepandial:   less than 140 mg/dL      Peak postprandial:   less than 180 mg/dL (1-2 hours)      Critically ill patients:  140 - 180 mg/dL   Lab Results  Component Value Date   GLUCAP 226 (H) 09/18/2017   HGBA1C 10.2 (H) 09/18/2017    Review of Glycemic ControlResults for JABRIA, LOOS (MRN [id number]) as of 09/18/2017 11:10  Ref. Range 09/18/2017 05:55 09/18/2017 07:01 09/18/2017 07:52 09/18/2017 08:45 09/18/2017 10:33  Glucose-Capillary Latest Ref Range: 65 - 99 mg/dL 122 (H) 124 (H) 135 (H) 155 (H) 226 (H)    Diabetes history: DM with DKA Outpatient Diabetes medications: Basaglar 45 units q HS, Humalog 12 units tid with meals, Freestyle Libre sensor- not currently on patient Current orders for Inpatient glycemic control:  Transitioning off insulin drip to Lantus 20 units daily, Novolog 3 units tid with meals, and Novolog sensitive tid with meals and HS  Inpatient Diabetes Program Recommendations:    Referral received due to patient's complaint of "hating needles" and not being compliant with insulin.  Spoke with patient and she states "I'd like to just take pills".  We discussed why pills will likely not work and that her body may not be making enough insulin.  Reviewed chart.  It appears that this has long been an issue with this patient.  I discussed possibility of V-Go insulin delivery system.  She states that she was on this in the past, however she states that they stopped it due to her needing "more insulin".  Per Dr. Arman Filter note, patient also had complained that her granddaughter was ripping it off?? She states that her husband was told be MD that he needs to help her however he works during the day.  ? Whether patient could try the V-Go again.  She reports weight loss and lower insulin needs then before.  She also states that the V-go "did not  bother her since she did not see the needles".  I asked patient to call MD's office and get appointment with Dr. Cruzita Lederer as soon as possible and discuss with her.  We discussed the effects of poor glucose management and repeated DKA.  Patient will need lots of reinforcement.  She did state that she would like her and her husband to see Vaughan Basta the diabetes educator so that he would know how to help her.    Thanks, Adah Perl, RN, BC-ADM Inpatient Diabetes Coordinator Pager [phone] (8a-5p)

## 2017-09-18 NOTE — Progress Notes (Signed)
Patient reports that she doesn't give herself insulin at home because she is afraid of needles and afraid to stick herself. When I asked her and husband if the husband could help give the shots he stated that he works in [employer] area and is gone early in the morning until evening and that he doesn't want to give her the shots either. Will continue to educate and support patient. Diabetes Coordinator is also consulted.

## 2017-09-18 NOTE — Plan of Care (Signed)
Problem: Health Behavior/Discharge Planning: Goal: Ability to manage health-related needs will improve Outcome: Progressing States has been in and out of the hospital 4 total times this year. States checks sugars at home, they do run high, in the 200's. Has frequent gastritis.

## 2017-09-18 NOTE — Discharge Summary (Signed)
Physician Discharge Summary   Patient ID: [name] [name] MRN: [id number] DOB/AGE: [birth year] 56 y.o.  Admit date: 09/17/2017 Discharge date: 09/18/2017  Primary Care Physician:  Jinny Sanders, MD  Discharge Diagnoses:    . DKA (diabetic ketoacidoses) ([place]) . Hypothyroidism, acquired . Essential hypertension . GERD . Gastroparesis due to DM ([place]) . Nausea and vomiting . Chest discomfort . Lactic acidosis   Medical noncompliance   Consults:  Diabetic coordinator   Recommendations for Outpatient Follow-up:  1. Please repeat CBC/BMET at next visit   DIET: carb modified diet    Allergies:   Allergies  Allergen Reactions  . Codeine Hives, Anxiety and Other (See Comments)     DISCHARGE MEDICATIONS: Current Discharge Medication List    CONTINUE these medications which have CHANGED   Details  Insulin Glargine (BASAGLAR KWIKPEN) 100 UNIT/ML SOPN Inject 0.3 mLs (30 Units total) into the skin at bedtime. Qty: 5 pen, Refills: 1    insulin lispro (HUMALOG KWIKPEN) 100 UNIT/ML KiwkPen Inject 0.08 mLs (8 Units total) into the skin 3 (three) times daily. Qty: 15 mL, Refills: 4    metoCLOPramide (REGLAN) 5 MG tablet TAKE 1 TABLET BY MOUTH 4 TIMES A DAY BEFORE MEALS AND AT BEDTIME Qty: 120 tablet, Refills: 0    ondansetron (ZOFRAN) 4 MG tablet TAKE 1 TABLET BY MOUTH EVERY 6 HOURS AS NEEDED FOR NAUSEA AND VOMITING Qty: 30 tablet, Refills: 0      CONTINUE these medications which have NOT CHANGED   Details  aspirin 81 MG chewable tablet Chew 1 tablet (81 mg total) by mouth daily. Qty: 30 tablet, Refills: 0    atorvastatin (LIPITOR) 80 MG tablet Take 1 tablet (80 mg total) by mouth daily at 6 PM. Qty: 90 tablet, Refills: 0    cetirizine (ZYRTEC) 10 MG tablet Take 1 tablet (10 mg total) by mouth daily. Qty: 30 tablet, Refills: 1    ezetimibe (ZETIA) 10 MG tablet Take 1 tablet (10 mg total) by mouth daily. Qty: 30 tablet, Refills: 11    levothyroxine (SYNTHROID,  LEVOTHROID) 150 MCG tablet Take 1 tablet (150 mcg total) by mouth daily. Qty: 90 tablet, Refills: 3    losartan (COZAAR) 25 MG tablet Take 25 mg by mouth daily. Refills: 12    metoprolol succinate (TOPROL-XL) 25 MG 24 hr tablet Take 25 mg by mouth daily. Refills: 12    RELPAX 40 MG tablet ONE TABLET BY MOUTH AT ONSET OF HEADACHE. MAY REPEAT IN 2 HOURS IF HEADACHE PERSISTS OR RECURS. Qty: 10 tablet, Refills: 2    sertraline (ZOLOFT) 50 MG tablet Take 1 tablet (50 mg total) by mouth daily. Qty: 90 tablet, Refills: 0    Continuous Blood Gluc Receiver (FREESTYLE LIBRE READER) DEVI 1 Device by Does not apply route 3 (three) times daily. Qty: 1 Device, Refills: 1    Continuous Blood Gluc Sensor (FREESTYLE LIBRE SENSOR SYSTEM) MISC 1 Device by Does not apply route every 30 (thirty) days. Qty: 3 each, Refills: 11    glucose blood (ONETOUCH VERIO) test strip Use to check blood sugar daily as needed.  Dx: E11.10 Qty: 100 each, Refills: 3         Brief H and P: For complete details please refer to admission H and P, but in brief : [name] [name] is a 56 y.o. female with medical history significant of IDDM with complications of gastroparesis, frequent DKA, NICM EF 55% in 05/2017, HTN, HLD, and hypothyroidism; presents with complaints of nausea and  vomiting since 4:30 AM this morning. Patient reports having up to 25 episodes of nonbloody emesis. Associated symptoms include mild dull abdominal pain with vomiting, mild cough, mild chest pain, and headache. Her last bowel movement was prior to arrival. Patient tried taking Zofran with mild relief, but when symptoms returned felt that she would be unable to keep down the Zofran and therefore came to the emergency department. She otherwise reports that she's been taking all medications as prescribed including Reglan. Review of records shows patient has been admitted multiple times in the past with the same with her last admission back in 06/2017.    Hospital Course:     DKA (diabetic ketoacidoses) ([name])secondary to noncompliance with insulin - Patient was admitted with DKA,anion gap of 17, glucose 383, UA with ketones, bicarbonate 18 - Patient admitted to not taking her insulin. She also came with intractable nausea and vomiting further worsening her situation. - Patient was placed on aggressive IV fluid hydration, IV Reglan, IV insulin drip - Once gap was closed and vomitingresolved, patient was transitioned to Lantus sliding scale insulin and meal coverage - since patient is taking insulin inconsistently, she was discharged on Lantus 30 units, NovoLog 8 units with meals and follow up with Dr. Cruzita Lederer, appointment scheduled on 10/23 - counseled patient strongly to be compliant with her medications, insulin    Gastroparesis due to DM [facility]) - worsened due to DKA, patient was placed on IV fluid hydration, IV insulin drip, IV Reglan, symptoms improved quickly. - Patient is not tolerating solid diet without any difficulty - continue Oral reglan before meals and Zofran as needed    Hypothyroidism, acquired - continue Synthroid  Lactic acidosis: initial lactic acid 2.46, and likely due to #1, UA negative, chest x-ray clear  CAD - Continue aspirin, Lipitor, Zetia  Hypertension - continue metoprolol, Cozaar  Depression - Continue Zoloft   Day of Discharge BP 108/68 (BP Location: Right Arm)   Pulse 91   Temp 98.4 F (36.9 C) (Oral)   Resp 19   Ht 4\' 11"  (1.499 m)   Wt 57.6 kg (127 lb)   LMP 08/11/2012   SpO2 100%   BMI 25.65 kg/m   Physical Exam: General: Alert and awake oriented x3 not in any acute distress. HEENT: anicteric sclera, pupils reactive to light and accommodation CVS: S1-S2 clear no murmur rubs or gallops Chest: clear to auscultation bilaterally, no wheezing rales or rhonchi Abdomen: soft nontender, nondistended, normal bowel sounds Extremities: no cyanosis, clubbing or edema noted bilaterally Neuro:  Cranial nerves II-XII intact, no focal neurological deficits   The results of significant diagnostics from this hospitalization (including imaging, microbiology, ancillary and laboratory) are listed below for reference.    LAB RESULTS: Basic Metabolic Panel:  Recent Labs Lab 09/18/17 0533 09/18/17 0853  NA 139 138  K 3.0* 3.2*  CL 106 105  CO2 22 20*  GLUCOSE 121* 171*  BUN 11 11  CREATININE 0.55 0.52  CALCIUM 8.8* 8.8*   Liver Function Tests:  Recent Labs Lab 09/18/17 0058  AST 17  ALT 12*  ALKPHOS 57  BILITOT 0.5  PROT 5.9*  ALBUMIN 3.4*    Recent Labs Lab 09/18/17 0058  LIPASE 20   No results for input(s): AMMONIA in the last 168 hours. CBC:  Recent Labs Lab 09/17/17 1937 09/18/17 0533  WBC 16.4* 12.7*  NEUTROABS  --  8.0*  HGB 15.0 12.4  HCT 42.2 36.2  MCV 83.1 83.0  PLT 369 278  Cardiac Enzymes:  Recent Labs Lab 09/18/17 0533 09/18/17 0853  TROPONINI <0.03 <0.03   BNP: Invalid input(s): POCBNP CBG:  Recent Labs Lab 09/18/17 1128 09/18/17 1235  GLUCAP 187* 147*    Significant Diagnostic Studies:  Dg Chest 2 View  Result Date: 09/17/2017 CLINICAL DATA:  Generalized chest pain, shortness of breath, nausea, and vomiting for 1 day, history hypertension, diabetes mellitus, former smoker EXAM: CHEST  2 VIEW COMPARISON:  06/22/2017 FINDINGS: Normal heart size, mediastinal contours, and pulmonary vascularity. Lungs hyperinflated but clear. No pulmonary infiltrate, pleural effusion or pneumothorax. Numerous EKG leads project over chest particularly RIGHT lung. No acute osseous findings. IMPRESSION: Hyperinflated lungs without infiltrate. Electronically Signed   By: Lavonia Dana M.D.   On: 09/17/2017 20:36    2D ECHO:   Disposition and Follow-up: Discharge Instructions    Diet Carb Modified    Complete by:  As directed    Discharge instructions    Complete by:  As directed    It is VERY IMPORTANT that you follow up with a PCP on a  regular basis.  Check your blood glucoses before each meal and at bedtime and maintain a log of your readings.  Bring this log with you when you follow up with your PCP so that he or she can adjust your insulin at your follow up visit.   Increase activity slowly    Complete by:  As directed        DISPOSITION home    [place]    Jinny Sanders, MD. Schedule an appointment as soon as possible for a visit in 2 week(s).   Specialty:  Family Medicine Contact information: [place] [address] [phone]        Philemon Kingdom, MD Follow up on 10/01/2017.   Specialty:  Internal Medicine Why:  at 3:15pm Contact information: [address] [place] [address] [phone]            Time spent on Discharge: 15mins   Signed:   Estill Cotta M.D. [facility] 09/18/2017, 3:42 PM Pager: [phone]

## 2017-09-18 NOTE — Progress Notes (Signed)
Notified NP of Potassium level ,anion gap and BS.

## 2017-09-19 ENCOUNTER — Inpatient Hospital Stay (HOSPITAL_COMMUNITY): Payer: 59

## 2017-09-19 ENCOUNTER — Inpatient Hospital Stay (HOSPITAL_COMMUNITY)
Admission: EM | Admit: 2017-09-19 | Discharge: 2017-09-23 | DRG: 637 | Disposition: A | Discharge status: Home / Self Care | Payer: 59 | Attending: Internal Medicine | Admitting: Internal Medicine

## 2017-09-19 ENCOUNTER — Emergency Department (HOSPITAL_COMMUNITY): Payer: 59

## 2017-09-19 ENCOUNTER — Encounter (HOSPITAL_COMMUNITY): Payer: Self-pay | Admitting: Family Medicine

## 2017-09-19 DIAGNOSIS — N182 Chronic kidney disease, stage 2 (mild): Secondary | ICD-10-CM | POA: Diagnosis present

## 2017-09-19 DIAGNOSIS — G629 Polyneuropathy, unspecified: Secondary | ICD-10-CM | POA: Diagnosis present

## 2017-09-19 DIAGNOSIS — E876 Hypokalemia: Secondary | ICD-10-CM | POA: Diagnosis present

## 2017-09-19 DIAGNOSIS — M810 Age-related osteoporosis without current pathological fracture: Secondary | ICD-10-CM | POA: Diagnosis present

## 2017-09-19 DIAGNOSIS — E1165 Type 2 diabetes mellitus with hyperglycemia: Secondary | ICD-10-CM

## 2017-09-19 DIAGNOSIS — Z833 Family history of diabetes mellitus: Secondary | ICD-10-CM

## 2017-09-19 DIAGNOSIS — Z7982 Long term (current) use of aspirin: Secondary | ICD-10-CM

## 2017-09-19 DIAGNOSIS — J189 Pneumonia, unspecified organism: Secondary | ICD-10-CM | POA: Diagnosis present

## 2017-09-19 DIAGNOSIS — I129 Hypertensive chronic kidney disease with stage 1 through stage 4 chronic kidney disease, or unspecified chronic kidney disease: Secondary | ICD-10-CM | POA: Diagnosis present

## 2017-09-19 DIAGNOSIS — R7989 Other specified abnormal findings of blood chemistry: Secondary | ICD-10-CM

## 2017-09-19 DIAGNOSIS — I2510 Atherosclerotic heart disease of native coronary artery without angina pectoris: Secondary | ICD-10-CM | POA: Diagnosis present

## 2017-09-19 DIAGNOSIS — E1149 Type 2 diabetes mellitus with other diabetic neurological complication: Secondary | ICD-10-CM

## 2017-09-19 DIAGNOSIS — R112 Nausea with vomiting, unspecified: Secondary | ICD-10-CM | POA: Diagnosis present

## 2017-09-19 DIAGNOSIS — K219 Gastro-esophageal reflux disease without esophagitis: Secondary | ICD-10-CM | POA: Diagnosis present

## 2017-09-19 DIAGNOSIS — K580 Irritable bowel syndrome with diarrhea: Secondary | ICD-10-CM | POA: Diagnosis present

## 2017-09-19 DIAGNOSIS — E1143 Type 2 diabetes mellitus with diabetic autonomic (poly)neuropathy: Secondary | ICD-10-CM | POA: Diagnosis present

## 2017-09-19 DIAGNOSIS — K3184 Gastroparesis: Secondary | ICD-10-CM | POA: Diagnosis present

## 2017-09-19 DIAGNOSIS — I248 Other forms of acute ischemic heart disease: Secondary | ICD-10-CM | POA: Diagnosis present

## 2017-09-19 DIAGNOSIS — D72829 Elevated white blood cell count, unspecified: Secondary | ICD-10-CM | POA: Diagnosis present

## 2017-09-19 DIAGNOSIS — E039 Hypothyroidism, unspecified: Secondary | ICD-10-CM | POA: Diagnosis present

## 2017-09-19 DIAGNOSIS — E1142 Type 2 diabetes mellitus with diabetic polyneuropathy: Secondary | ICD-10-CM

## 2017-09-19 DIAGNOSIS — E872 Acidosis, unspecified: Secondary | ICD-10-CM | POA: Diagnosis present

## 2017-09-19 DIAGNOSIS — R9431 Abnormal electrocardiogram [ECG] [EKG]: Secondary | ICD-10-CM | POA: Diagnosis not present

## 2017-09-19 DIAGNOSIS — E1110 Type 2 diabetes mellitus with ketoacidosis without coma: Principal | ICD-10-CM | POA: Diagnosis present

## 2017-09-19 DIAGNOSIS — E0810 Diabetes mellitus due to underlying condition with ketoacidosis without coma: Secondary | ICD-10-CM | POA: Diagnosis not present

## 2017-09-19 DIAGNOSIS — E781 Pure hyperglyceridemia: Secondary | ICD-10-CM | POA: Diagnosis not present

## 2017-09-19 DIAGNOSIS — Z8249 Family history of ischemic heart disease and other diseases of the circulatory system: Secondary | ICD-10-CM

## 2017-09-19 DIAGNOSIS — I429 Cardiomyopathy, unspecified: Secondary | ICD-10-CM | POA: Diagnosis present

## 2017-09-19 DIAGNOSIS — F329 Major depressive disorder, single episode, unspecified: Secondary | ICD-10-CM | POA: Diagnosis present

## 2017-09-19 DIAGNOSIS — E785 Hyperlipidemia, unspecified: Secondary | ICD-10-CM | POA: Diagnosis present

## 2017-09-19 DIAGNOSIS — Z885 Allergy status to narcotic agent status: Secondary | ICD-10-CM

## 2017-09-19 DIAGNOSIS — Z79899 Other long term (current) drug therapy: Secondary | ICD-10-CM

## 2017-09-19 DIAGNOSIS — A419 Sepsis, unspecified organism: Secondary | ICD-10-CM

## 2017-09-19 DIAGNOSIS — E1122 Type 2 diabetes mellitus with diabetic chronic kidney disease: Secondary | ICD-10-CM | POA: Diagnosis present

## 2017-09-19 DIAGNOSIS — Z9114 Patient's other noncompliance with medication regimen: Secondary | ICD-10-CM

## 2017-09-19 DIAGNOSIS — K589 Irritable bowel syndrome without diarrhea: Secondary | ICD-10-CM | POA: Diagnosis present

## 2017-09-19 DIAGNOSIS — R778 Other specified abnormalities of plasma proteins: Secondary | ICD-10-CM

## 2017-09-19 DIAGNOSIS — Z794 Long term (current) use of insulin: Secondary | ICD-10-CM

## 2017-09-19 DIAGNOSIS — Z87891 Personal history of nicotine dependence: Secondary | ICD-10-CM

## 2017-09-19 DIAGNOSIS — F411 Generalized anxiety disorder: Secondary | ICD-10-CM | POA: Diagnosis present

## 2017-09-19 DIAGNOSIS — R748 Abnormal levels of other serum enzymes: Secondary | ICD-10-CM | POA: Diagnosis not present

## 2017-09-19 LAB — URINALYSIS, ROUTINE W REFLEX MICROSCOPIC
Bilirubin Urine: NEGATIVE
Glucose, UA: >=500 mg/dL — AB
Ketones, ur: 80 mg/dL — AB
Leukocytes, UA: NEGATIVE
Nitrite: NEGATIVE
PROTEIN: 100 mg/dL — AB
Specific Gravity, Urine: 1.015 (ref 1.005–1.030)
pH: 5 (ref 5.0–8.0)

## 2017-09-19 LAB — BASIC METABOLIC PANEL
Anion gap: 21 — ABNORMAL HIGH (ref 5–15)
Anion gap: 19 — ABNORMAL HIGH (ref 5–15)
Anion gap: 12 (ref 5–15)
Anion gap: 9 (ref 5–15)
BUN: 13 mg/dL (ref 6–20)
BUN: 12 mg/dL (ref 6–20)
BUN: 11 mg/dL (ref 6–20)
BUN: 11 mg/dL (ref 6–20)
CALCIUM: 9.1 mg/dL (ref 8.9–10.3)
CO2: 17 mmol/L — ABNORMAL LOW (ref 22–32)
CO2: 14 mmol/L — ABNORMAL LOW (ref 22–32)
CO2: 16 mmol/L — ABNORMAL LOW (ref 22–32)
CO2: 21 mmol/L — ABNORMAL LOW (ref 22–32)
CREATININE: 0.7 mg/dL (ref 0.44–1.00)
CREATININE: 0.55 mg/dL (ref 0.44–1.00)
Calcium: 8.4 mg/dL — ABNORMAL LOW (ref 8.9–10.3)
Calcium: 8 mg/dL — ABNORMAL LOW (ref 8.9–10.3)
Calcium: 8 mg/dL — ABNORMAL LOW (ref 8.9–10.3)
Chloride: 96 mmol/L — ABNORMAL LOW (ref 101–111)
Chloride: 102 mmol/L (ref 101–111)
Chloride: 109 mmol/L (ref 101–111)
Chloride: 109 mmol/L (ref 101–111)
Creatinine, Ser: 0.83 mg/dL (ref 0.44–1.00)
Creatinine, Ser: 0.78 mg/dL (ref 0.44–1.00)
GFR calc Af Amer: >60 mL/min (ref >60)
GLUCOSE: 271 mg/dL — AB (ref 65–99)
Glucose, Bld: 272 mg/dL — ABNORMAL HIGH (ref 65–99)
Glucose, Bld: 219 mg/dL — ABNORMAL HIGH (ref 65–99)
Glucose, Bld: 140 mg/dL — ABNORMAL HIGH (ref 65–99)
POTASSIUM: 3.1 mmol/L — AB (ref 3.5–5.1)
Potassium: 4.1 mmol/L (ref 3.5–5.1)
Potassium: 3 mmol/L — ABNORMAL LOW (ref 3.5–5.1)
Potassium: 3.2 mmol/L — ABNORMAL LOW (ref 3.5–5.1)
SODIUM: 134 mmol/L — AB (ref 135–145)
SODIUM: 135 mmol/L (ref 135–145)
SODIUM: 137 mmol/L (ref 135–145)
SODIUM: 139 mmol/L (ref 135–145)

## 2017-09-19 LAB — CBG MONITORING, ED
GLUCOSE-CAPILLARY: 264 mg/dL — AB (ref 65–99)
GLUCOSE-CAPILLARY: 221 mg/dL — AB (ref 65–99)
GLUCOSE-CAPILLARY: 145 mg/dL — AB (ref 65–99)
GLUCOSE-CAPILLARY: 138 mg/dL — AB (ref 65–99)
GLUCOSE-CAPILLARY: 152 mg/dL — AB (ref 65–99)
GLUCOSE-CAPILLARY: 100 mg/dL — AB (ref 65–99)
GLUCOSE-CAPILLARY: 100 mg/dL — AB (ref 65–99)
Glucose-Capillary: 270 mg/dL — ABNORMAL HIGH (ref 65–99)
Glucose-Capillary: 261 mg/dL — ABNORMAL HIGH (ref 65–99)
Glucose-Capillary: 204 mg/dL — ABNORMAL HIGH (ref 65–99)
Glucose-Capillary: 234 mg/dL — ABNORMAL HIGH (ref 65–99)
Glucose-Capillary: 171 mg/dL — ABNORMAL HIGH (ref 65–99)
Glucose-Capillary: 149 mg/dL — ABNORMAL HIGH (ref 65–99)

## 2017-09-19 LAB — I-STAT CG4 LACTIC ACID, ED
LACTIC ACID, VENOUS: 3.54 mmol/L — AB (ref 0.5–1.9)
Lactic Acid, Venous: 3.74 mmol/L (ref 0.5–1.9)

## 2017-09-19 LAB — I-STAT TROPONIN, ED: TROPONIN I, POC: 0.3 ng/mL — AB (ref 0.00–0.08)

## 2017-09-19 LAB — I-STAT VENOUS BLOOD GAS, ED
ACID-BASE DEFICIT: 2 mmol/L (ref 0.0–2.0)
BICARBONATE: 21.3 mmol/L (ref 20.0–28.0)
O2 SAT: 86 %
TCO2: 22 mmol/L (ref 22–32)
pCO2, Ven: 30.8 mmHg — ABNORMAL LOW (ref 44.0–60.0)
pH, Ven: 7.449 — ABNORMAL HIGH (ref 7.250–7.430)
pO2, Ven: 49 mmHg — ABNORMAL HIGH (ref 32.0–45.0)

## 2017-09-19 LAB — CBC
HCT: 44.7 % (ref 36.0–46.0)
Hemoglobin: 15.9 g/dL — ABNORMAL HIGH (ref 12.0–15.0)
MCH: 29.6 pg (ref 26.0–34.0)
MCHC: 35.6 g/dL (ref 30.0–36.0)
MCV: 83.1 fL (ref 78.0–100.0)
PLATELETS: 382 10*3/uL (ref 150–400)
RBC: 5.38 MIL/uL — ABNORMAL HIGH (ref 3.87–5.11)
RDW: 13.7 % (ref 11.5–15.5)
WBC: 22.9 10*3/uL — ABNORMAL HIGH (ref 4.0–10.5)

## 2017-09-19 LAB — TROPONIN I
TROPONIN I: 0.28 ng/mL — AB (ref <0.03)
Troponin I: 0.49 ng/mL (ref <0.03)
Troponin I: 0.68 ng/mL (ref <0.03)

## 2017-09-19 LAB — APTT: aPTT: 23 seconds — ABNORMAL LOW (ref 24–36)

## 2017-09-19 LAB — LACTIC ACID, PLASMA
LACTIC ACID, VENOUS: 3.2 mmol/L — AB (ref 0.5–1.9)
Lactic Acid, Venous: 1.6 mmol/L (ref 0.5–1.9)

## 2017-09-19 LAB — PROCALCITONIN: Procalcitonin: <0.1 ng/mL

## 2017-09-19 LAB — GLUCOSE, CAPILLARY: Glucose-Capillary: 153 mg/dL — ABNORMAL HIGH (ref 65–99)

## 2017-09-19 MED ORDER — METOCLOPRAMIDE HCL 5 MG PO TABS
5.0000 mg | ORAL_TABLET | Freq: Four times a day (QID) | ORAL | Status: DC | PRN
  Administered 2017-09-22 (×2): 5 mg via ORAL
  Filled 2017-09-19 (×2): qty 1

## 2017-09-19 MED ORDER — ATORVASTATIN CALCIUM 80 MG PO TABS
80.0000 mg | ORAL_TABLET | Freq: Every day | ORAL | Status: DC
  Administered 2017-09-20 – 2017-09-22 (×3): 80 mg via ORAL
  Filled 2017-09-19 (×4): qty 1

## 2017-09-19 MED ORDER — POTASSIUM CHLORIDE 10 MEQ/100ML IV SOLN
10.0000 meq | INTRAVENOUS | Status: AC
  Administered 2017-09-19 (×2): 10 meq via INTRAVENOUS
  Filled 2017-09-19 (×2): qty 100

## 2017-09-19 MED ORDER — VANCOMYCIN HCL 500 MG IV SOLR
500.0000 mg | Freq: Three times a day (TID) | INTRAVENOUS | Status: DC
  Administered 2017-09-20 (×2): 500 mg via INTRAVENOUS
  Filled 2017-09-19 (×5): qty 500

## 2017-09-19 MED ORDER — SODIUM CHLORIDE 0.9 % IV BOLUS (SEPSIS): 1000.0000 mL | Freq: Once | INTRAVENOUS | Status: DC

## 2017-09-19 MED ORDER — ONDANSETRON HCL 4 MG PO TABS
4.0000 mg | ORAL_TABLET | Freq: Four times a day (QID) | ORAL | Status: DC | PRN
  Administered 2017-09-19: 4 mg via ORAL
  Filled 2017-09-19: qty 1

## 2017-09-19 MED ORDER — ONDANSETRON HCL 4 MG/2ML IJ SOLN
4.0000 mg | Freq: Once | INTRAMUSCULAR | Status: AC
  Administered 2017-09-19: 4 mg via INTRAVENOUS
  Filled 2017-09-19: qty 2

## 2017-09-19 MED ORDER — ONDANSETRON HCL 4 MG/2ML IJ SOLN
4.0000 mg | Freq: Four times a day (QID) | INTRAMUSCULAR | Status: DC | PRN
  Administered 2017-09-20 (×3): 4 mg via INTRAVENOUS
  Filled 2017-09-19 (×3): qty 2

## 2017-09-19 MED ORDER — SODIUM CHLORIDE 0.9 % IV SOLN
INTRAVENOUS | Status: AC
  Filled 2017-09-19: qty 1

## 2017-09-19 MED ORDER — POTASSIUM CHLORIDE 10 MEQ/100ML IV SOLN: 10.0000 meq | INTRAVENOUS | Status: DC

## 2017-09-19 MED ORDER — VANCOMYCIN HCL 10 G IV SOLR
1250.0000 mg | Freq: Once | INTRAVENOUS | Status: AC
  Administered 2017-09-19: 1250 mg via INTRAVENOUS
  Filled 2017-09-19: qty 1250

## 2017-09-19 MED ORDER — SENNOSIDES-DOCUSATE SODIUM 8.6-50 MG PO TABS: 1.0000 | ORAL_TABLET | Freq: Every evening | ORAL | Status: DC | PRN

## 2017-09-19 MED ORDER — IOPAMIDOL (ISOVUE-370) INJECTION 76%
INTRAVENOUS | Status: AC
  Administered 2017-09-19: 100 mL via INTRAVENOUS
  Filled 2017-09-19: qty 100

## 2017-09-19 MED ORDER — METOPROLOL SUCCINATE ER 25 MG PO TB24: 25.0000 mg | ORAL_TABLET | Freq: Every day | ORAL | Status: DC

## 2017-09-19 MED ORDER — BISACODYL 10 MG RE SUPP: 10.0000 mg | Freq: Every day | RECTAL | Status: DC | PRN

## 2017-09-19 MED ORDER — EZETIMIBE 10 MG PO TABS
10.0000 mg | ORAL_TABLET | Freq: Every day | ORAL | Status: DC
  Administered 2017-09-20 – 2017-09-23 (×4): 10 mg via ORAL
  Filled 2017-09-19 (×5): qty 1

## 2017-09-19 MED ORDER — BASAGLAR KWIKPEN 100 UNIT/ML ~~LOC~~ SOPN: 30.0000 [IU] | PEN_INJECTOR | Freq: Every day | SUBCUTANEOUS | Status: DC

## 2017-09-19 MED ORDER — LEVOTHYROXINE SODIUM 75 MCG PO TABS
150.0000 ug | ORAL_TABLET | Freq: Every day | ORAL | Status: DC
  Administered 2017-09-20 – 2017-09-23 (×4): 150 ug via ORAL
  Filled 2017-09-19 (×2): qty 2
  Filled 2017-09-19: qty 1
  Filled 2017-09-19: qty 6
  Filled 2017-09-19: qty 2

## 2017-09-19 MED ORDER — SODIUM CHLORIDE 0.9 % IV BOLUS (SEPSIS)
1000.0000 mL | Freq: Once | INTRAVENOUS | Status: AC
  Administered 2017-09-19: 1000 mL via INTRAVENOUS

## 2017-09-19 MED ORDER — ASPIRIN 81 MG PO CHEW
81.0000 mg | CHEWABLE_TABLET | Freq: Every day | ORAL | Status: DC
Start: 2017-09-19 — End: 2017-09-23
  Administered 2017-09-19 – 2017-09-23 (×5): 81 mg via ORAL
  Filled 2017-09-19 (×5): qty 1

## 2017-09-19 MED ORDER — ACETAMINOPHEN 325 MG PO TABS
650.0000 mg | ORAL_TABLET | Freq: Four times a day (QID) | ORAL | Status: DC | PRN
  Administered 2017-09-22 (×2): 650 mg via ORAL
  Filled 2017-09-19 (×2): qty 2

## 2017-09-19 MED ORDER — POTASSIUM CHLORIDE 10 MEQ/100ML IV SOLN: 10.0000 meq | INTRAVENOUS | Status: AC

## 2017-09-19 MED ORDER — LORATADINE 10 MG PO TABS
10.0000 mg | ORAL_TABLET | Freq: Every day | ORAL | Status: DC
  Administered 2017-09-19 – 2017-09-22 (×4): 10 mg via ORAL
  Filled 2017-09-19 (×5): qty 1

## 2017-09-19 MED ORDER — SODIUM CHLORIDE 0.9 % IV SOLN
INTRAVENOUS | Status: DC
  Administered 2017-09-19: 2 [IU]/h via INTRAVENOUS
  Filled 2017-09-19: qty 1

## 2017-09-19 MED ORDER — LOSARTAN POTASSIUM 25 MG PO TABS
25.0000 mg | ORAL_TABLET | Freq: Every day | ORAL | Status: DC
  Administered 2017-09-20 – 2017-09-23 (×4): 25 mg via ORAL
  Filled 2017-09-19 (×5): qty 1

## 2017-09-19 MED ORDER — METOCLOPRAMIDE HCL 5 MG/ML IJ SOLN
10.0000 mg | Freq: Once | INTRAMUSCULAR | Status: AC
  Administered 2017-09-19: 10 mg via INTRAVENOUS
  Filled 2017-09-19: qty 2

## 2017-09-19 MED ORDER — PIPERACILLIN-TAZOBACTAM 3.375 G IVPB 30 MIN
3.3750 g | Freq: Once | INTRAVENOUS | Status: AC
  Administered 2017-09-19: 3.375 g via INTRAVENOUS
  Filled 2017-09-19: qty 50

## 2017-09-19 MED ORDER — ELETRIPTAN HYDROBROMIDE 40 MG PO TABS: 40.0000 mg | ORAL_TABLET | Freq: Two times a day (BID) | ORAL | Status: DC | PRN

## 2017-09-19 MED ORDER — ACETAMINOPHEN 650 MG RE SUPP: 650.0000 mg | Freq: Four times a day (QID) | RECTAL | Status: DC | PRN

## 2017-09-19 MED ORDER — MORPHINE SULFATE (PF) 4 MG/ML IV SOLN
2.0000 mg | INTRAVENOUS | Status: DC | PRN
  Administered 2017-09-19 – 2017-09-21 (×6): 2 mg via INTRAVENOUS
  Filled 2017-09-19 (×6): qty 1

## 2017-09-19 MED ORDER — METOPROLOL TARTRATE 5 MG/5ML IV SOLN
5.0000 mg | Freq: Four times a day (QID) | INTRAVENOUS | Status: DC
  Administered 2017-09-19 – 2017-09-21 (×8): 5 mg via INTRAVENOUS
  Filled 2017-09-19 (×8): qty 5

## 2017-09-19 MED ORDER — SODIUM CHLORIDE 0.9 % IV SOLN: INTRAVENOUS | Status: DC

## 2017-09-19 MED ORDER — METOPROLOL TARTRATE 5 MG/5ML IV SOLN
5.0000 mg | Freq: Four times a day (QID) | INTRAVENOUS | Status: DC
  Administered 2017-09-19: 5 mg via INTRAVENOUS
  Filled 2017-09-19: qty 5

## 2017-09-19 MED ORDER — SODIUM CHLORIDE 0.9 % IV SOLN
INTRAVENOUS | Status: AC
  Administered 2017-09-19: 999 mL/h via INTRAVENOUS

## 2017-09-19 MED ORDER — SERTRALINE HCL 50 MG PO TABS
50.0000 mg | ORAL_TABLET | Freq: Every day | ORAL | Status: DC
  Administered 2017-09-20 – 2017-09-23 (×4): 50 mg via ORAL
  Filled 2017-09-19 (×5): qty 1

## 2017-09-19 MED ORDER — PROMETHAZINE HCL 25 MG/ML IJ SOLN
25.0000 mg | Freq: Once | INTRAMUSCULAR | Status: AC
  Administered 2017-09-19: 25 mg via INTRAVENOUS
  Filled 2017-09-19: qty 1

## 2017-09-19 MED ORDER — DEXTROSE-NACL 5-0.45 % IV SOLN
INTRAVENOUS | Status: DC
  Administered 2017-09-19: 21:00:00 via INTRAVENOUS
  Administered 2017-09-19: 50 mL/h via INTRAVENOUS

## 2017-09-19 MED ORDER — DEXTROSE-NACL 5-0.45 % IV SOLN: INTRAVENOUS | Status: AC

## 2017-09-19 MED ORDER — PIPERACILLIN-TAZOBACTAM 3.375 G IVPB
3.3750 g | Freq: Three times a day (TID) | INTRAVENOUS | Status: DC
  Administered 2017-09-19 – 2017-09-21 (×6): 3.375 g via INTRAVENOUS
  Filled 2017-09-19 (×9): qty 50

## 2017-09-19 MED ORDER — ENOXAPARIN SODIUM 40 MG/0.4ML ~~LOC~~ SOLN
40.0000 mg | SUBCUTANEOUS | Status: DC
  Administered 2017-09-20 – 2017-09-22 (×3): 40 mg via SUBCUTANEOUS
  Filled 2017-09-19 (×5): qty 0.4

## 2017-09-19 MED ORDER — SODIUM CHLORIDE 0.9 % IV SOLN
INTRAVENOUS | Status: DC
  Administered 2017-09-19: 10:00:00 via INTRAVENOUS

## 2017-09-19 MED ORDER — ALBUTEROL SULFATE (2.5 MG/3ML) 0.083% IN NEBU
2.5000 mg | INHALATION_SOLUTION | RESPIRATORY_TRACT | Status: DC | PRN
  Administered 2017-09-19 – 2017-09-20 (×3): 2.5 mg via RESPIRATORY_TRACT
  Filled 2017-09-19 (×3): qty 3

## 2017-09-19 NOTE — ED Notes (Signed)
Pt denies chest pain at this time.

## 2017-09-19 NOTE — ED Notes (Signed)
Patient CBG was, 234 the Nurse was informed.

## 2017-09-19 NOTE — ED Notes (Signed)
2mg  Morphine wasted with Lexine Baton, Therapist, sports.

## 2017-09-19 NOTE — ED Notes (Signed)
Dark green tube brought to mini lab, sent down to main lab, no POC orders for this patient at this time.

## 2017-09-19 NOTE — ED Notes (Signed)
Pt c?o chest pain discused with MD and wirt man pa. Will repeat EKG and troponin.

## 2017-09-19 NOTE — H&P (Signed)
History and Physical    [name] [name]:[id number] DOB: October 17, 1961 DOA: 09/19/2017   PCP: Jinny Sanders, MD   Patient coming from:  Home    Chief Complaint: Abdominal pain, nausea and vomiting   HPI: [name] [name] is a 56 y.o. female with medical history significant for GERD, HTN, HLD, hypothyroidism,  NICM EF 55%in 05/2017, DM with  multiple admissions for DKA, most recently from 10/9-10/10 for DKA and gastroparesis due to DM with nausea and vomiting and lactic acidosis, presenting with same complaints today. Patient has a history of medicine non compliance. She began having nausea and multiple episodes of non billous, non bloody  vomiting without diarrhea,  since around 9:30 pm. She reports diffuse abdominal discomfort, radiating to the chest, similar to her prior admission. She has been unable to keep anything down.     ED Course:  BP (!) 127/94   Pulse (!) 114   Temp (!) 97.5 F (36.4 C) (Oral)   Resp (!) 31   Ht 4\' 11"  (1.499 m)   Wt 57.6 kg (127 lb)   LMP 08/11/2012   SpO2 91%   BMI 25.65 kg/m   Glucose was 270 on arrival, given 3 units of Novolog and then placed on Insulin drip   Anion Gap 21  She was also given Zofran IV with some relief.   Review of Systems:  As per HPI otherwise all other systems reviewed and are negative  Past Medical History:  Diagnosis Date  . Anxiety   . B12 deficiency   . Chest pain 04/05/2017  . GERD (gastroesophageal reflux disease)   . Hyperlipidemia   . Hypertension    [name]  . Hypothyroidism   . IBS (irritable bowel syndrome)   . Intermittent vertigo   . 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 Leg cramping    "@ night" (09/18/2013)  . Migraine    "once q couple months" (09/18/2013)  . Osteoporosis   . Peripheral neuropathy   . Type II diabetes mellitus ([place])   . Umbilical hernia    unrepaired (09/18/2013)    Past Surgical History:  Procedure Laterality Date  . CESAREAN SECTION  1989  .  LAPAROSCOPIC ASSISTED VAGINAL HYSTERECTOMY  09/23/2012   Procedure: LAPAROSCOPIC ASSISTED VAGINAL HYSTERECTOMY;  Surgeon: Linda Hedges, DO;  Location: [facility];  Service: Gynecology;  Laterality: N/A;  pull Dr Gregor Hams instrument  . LEFT HEART CATH AND CORONARY ANGIOGRAPHY N/A 02/11/2017   Procedure: Left Heart Cath and Coronary Angiography;  Surgeon: Lorretta Harp, MD;  Location: [facility];  Service: Cardiovascular;  Laterality: N/A;  . TUBAL LIGATION Bilateral 1992  . VAGINAL HYSTERECTOMY  2013    Social History Social History   Social History  . Marital status: Married    Spouse name: N/A  . Number of children: 2  . Years of education: N/A   Occupational History  . UNEMPLOYED Unemployed    homemaker   Social History Main Topics  . Smoking status: Former Smoker    Years: 0.20    Types: Cigarettes    Quit date: 12/10/1990  . Smokeless tobacco: Never Used  . Alcohol use No  . Drug use: Yes    Frequency: 3.0 times per week    Types: Marijuana  . Sexual activity: Yes   Other Topics Concern  . Not on file   Social History Narrative   Regular exercise- no    Diet- lots of mountain dew,  limits fast foods      Allergies  Allergen Reactions  . Codeine Hives, Anxiety and Other (See Comments)    Family History  Problem Relation Age of Onset  . Diabetes Other   . Heart disease Other   . Heart failure Mother   . Diabetes Maternal Grandmother   . Alzheimer's disease Maternal Grandmother   . Coronary artery disease Maternal Grandmother   . Heart attack Maternal Grandfather   . Heart failure Brother      Prior to Admission medications   Medication Sig Start Date End Date Taking? Authorizing Provider  aspirin 81 MG chewable tablet Chew 1 tablet (81 mg total) by mouth daily. 02/12/17   Jani Gravel, MD  atorvastatin (LIPITOR) 80 MG tablet Take 1 tablet (80 mg total) by mouth daily at 6 PM. 09/04/16   Bedsole, Amy E, MD  cetirizine (ZYRTEC) 10 MG tablet Take 1 tablet (10  mg total) by mouth daily. 08/05/17   Bedsole, Amy E, MD  Continuous Blood Gluc Receiver (FREESTYLE LIBRE READER) DEVI 1 Device by Does not apply route 3 (three) times daily. 04/03/17   Philemon Kingdom, MD  Continuous Blood Gluc Sensor (FREESTYLE LIBRE SENSOR SYSTEM) MISC 1 Device by Does not apply route every 30 (thirty) days. 04/03/17   Philemon Kingdom, MD  ezetimibe (ZETIA) 10 MG tablet Take 1 tablet (10 mg total) by mouth daily. 09/11/16   Bedsole, Amy E, MD  glucose blood (ONETOUCH VERIO) test strip Use to check blood sugar daily as needed.  Dx: E11.10 06/17/17   Jinny Sanders, MD  Insulin Glargine (BASAGLAR KWIKPEN) 100 UNIT/ML SOPN Inject 0.3 mLs (30 Units total) into the skin at bedtime. 09/19/17   [name], [name], MD  insulin lispro (HUMALOG KWIKPEN) 100 UNIT/ML KiwkPen Inject 0.08 mLs (8 Units total) into the skin 3 (three) times daily. 09/18/17   [name], Ripudeep Raliegh Ip, MD  levothyroxine (SYNTHROID, LEVOTHROID) 150 MCG tablet Take 1 tablet (150 mcg total) by mouth daily. 04/03/17   Philemon Kingdom, MD  losartan (COZAAR) 25 MG tablet Take 25 mg by mouth daily. 05/27/17   [provider]  metoCLOPramide (REGLAN) 5 MG tablet TAKE 1 TABLET BY MOUTH 4 TIMES A DAY BEFORE MEALS AND AT BEDTIME 09/18/17   [name], [name], MD  metoprolol succinate (TOPROL-XL) 25 MG 24 hr tablet Take 25 mg by mouth daily. 03/24/17   [provider]  ondansetron (ZOFRAN) 4 MG tablet TAKE 1 TABLET BY MOUTH EVERY 6 HOURS AS NEEDED FOR NAUSEA AND VOMITING 09/18/17   [name], [name], MD  RELPAX 40 MG tablet ONE TABLET BY MOUTH AT ONSET OF HEADACHE. MAY REPEAT IN 2 HOURS IF HEADACHE PERSISTS OR RECURS. 10/17/16   Bedsole, Amy E, MD  sertraline (ZOLOFT) 50 MG tablet Take 1 tablet (50 mg total) by mouth daily. 09/02/17   Jinny Sanders, MD    Physical Exam:  Vitals:   09/19/17 0515 09/19/17 0600 09/19/17 0630 09/19/17 0700  BP: 112/86 118/72 122/87 (!) 127/94  Pulse: (!) 107 (!) 128 (!) 116 (!) 114  Resp: 19 (!)  26 (!) 26 (!) 31  Temp:      TempSrc:      SpO2: 99% 98% 91%   Weight:      Height:       Constitutional: NAD, uncomfortable, ill appearing  Eyes: PERRL, lids and conjunctivae normal ENMT: Mucous membranes are dry, without exudate or lesions . Several missing teeth Neck: normal, supple,  Respiratory: Diminished in  bases left more than right with scattered rhonchi bilaterally Cardiovascular: Regular rate and rhythm, tachycardic with heart rate in the 120s  Abdomen: Soft, diffusely tender without rebound or guarding, no CVA area tenderness, bowel sounds are positive,  nondistended,  Musculoskeletal: no clubbing / cyanosis. Moves all extremities Skin: no jaundice, No lesions.  Neurologic: Sensation intact  Strength equal in all extremities Psychiatric:   Alert and oriented x 3. Normal mood.    Labs on Admission: I have personally reviewed following labs and imaging studies  CBC:  Recent Labs Lab 09/17/17 1937 09/18/17 0533 09/19/17 0454  WBC 16.4* 12.7* 22.9*  NEUTROABS  --  8.0*  --   HGB 15.0 12.4 15.9*  HCT 42.2 36.2 44.7  MCV 83.1 83.0 83.1  PLT 369 278 101    Basic Metabolic Panel:  Recent Labs Lab 09/18/17 0059 09/18/17 0533 09/18/17 0853 09/19/17 0454 09/19/17 0752  NA 141 139 138 134* 135  K 3.2* 3.0* 3.2* 4.1 3.1*  CL 110 106 105 96* 102  CO2 24 22 20* 17* 14*  GLUCOSE 153* 121* 171* 271* 272*  BUN 12 11 11 13 12   CREATININE 0.60 0.55 0.52 0.83 0.78  CALCIUM 8.7* 8.8* 8.8* 9.1 8.4*    GFR: Estimated Creatinine Clearance: 62.2 mL/min (by C-G formula based on SCr of 0.78 mg/dL).  Liver Function Tests:  Recent Labs Lab 09/18/17 0058  AST 17  ALT 12*  ALKPHOS 57  BILITOT 0.5  PROT 5.9*  ALBUMIN 3.4*    Recent Labs Lab 09/18/17 0058  LIPASE 20   No results for input(s): AMMONIA in the last 168 hours.  Coagulation Profile: No results for input(s): INR, PROTIME in the last 168 hours.  Cardiac Enzymes:  Recent Labs Lab 09/17/17 2221  09/18/17 0533 09/18/17 0853 09/19/17 0454 09/19/17 0752  TROPONINI <0.03 <0.03 <0.03 0.28* 0.49*    BNP (last 3 results) No results for input(s): PROBNP in the last 8760 hours.  HbA1C:  Recent Labs  09/18/17 0533  HGBA1C 10.2*    CBG:  Recent Labs Lab 09/19/17 0447 09/19/17 0638 09/19/17 0747 09/19/17 0905 09/19/17 0951  GLUCAP 270* 261* 264* 221* 204*    Lipid Profile: No results for input(s): CHOL, HDL, LDLCALC, TRIG, CHOLHDL, LDLDIRECT in the last 72 hours.  Thyroid Function Tests: No results for input(s): TSH, T4TOTAL, FREET4, T3FREE, THYROIDAB in the last 72 hours.  Anemia Panel: No results for input(s): VITAMINB12, FOLATE, FERRITIN, TIBC, IRON, RETICCTPCT in the last 72 hours.  Urine analysis:    Component Value Date/Time   COLORURINE YELLOW 09/19/2017 [place] 09/19/2017 0517   LABSPEC 1.015 09/19/2017 0517   PHURINE 5.0 09/19/2017 0517   GLUCOSEU >=500 (A) 09/19/2017 0517   HGBUR SMALL (A) 09/19/2017 0517   BILIRUBINUR NEGATIVE 09/19/2017 0517   BILIRUBINUR negative 09/11/2016 1038   KETONESUR 80 (A) 09/19/2017 0517   PROTEINUR 100 (A) 09/19/2017 0517   UROBILINOGEN 0.2 09/11/2016 1038   UROBILINOGEN 0.2 10/28/2013 1723   NITRITE NEGATIVE 09/19/2017 0517   LEUKOCYTESUR NEGATIVE 09/19/2017 0517    Sepsis Labs: @LABRCNTIP (procalcitonin:4,lacticidven:4) ) Recent Results (from the past 240 hour(s))  MRSA PCR Screening     Status: None   Collection Time: 09/17/17 11:57 PM  Result Value Ref Range Status   MRSA by PCR NEGATIVE NEGATIVE Final    Comment:        The GeneXpert MRSA Assay (FDA approved for NASAL specimens only), is one component of a comprehensive MRSA colonization surveillance  program. It is not intended to diagnose MRSA infection nor to guide or monitor treatment for MRSA infections.      Radiological Exams on Admission: Dg Chest 2 View  Result Date: 09/19/2017 CLINICAL DATA:  Acute onset of mid chest  pain.  Initial encounter. EXAM: CHEST  2 VIEW COMPARISON:  Chest radiograph performed 09/17/2017 FINDINGS: The lungs are well-aerated. Mild vascular congestion is noted. There is no evidence of focal opacification, pleural effusion or pneumothorax. The heart is normal in size; the mediastinal contour is within normal limits. No acute osseous abnormalities are seen. IMPRESSION: Mild vascular congestion noted.  Lungs remain grossly clear. Electronically Signed   By: Garald Balding M.D.   On: 09/19/2017 05:38   Dg Chest 2 View  Result Date: 09/17/2017 CLINICAL DATA:  Generalized chest pain, shortness of breath, nausea, and vomiting for 1 day, history hypertension, diabetes mellitus, former smoker EXAM: CHEST  2 VIEW COMPARISON:  06/22/2017 FINDINGS: Normal heart size, mediastinal contours, and pulmonary vascularity. Lungs hyperinflated but clear. No pulmonary infiltrate, pleural effusion or pneumothorax. Numerous EKG leads project over chest particularly RIGHT lung. No acute osseous findings. IMPRESSION: Hyperinflated lungs without infiltrate. Electronically Signed   By: Lavonia Dana M.D.   On: 09/17/2017 20:36   Ct Angio Chest Pe W And/or Wo Contrast  Result Date: 09/19/2017 CLINICAL DATA:  Diffuse chest and abdominal pain. Nausea, vomiting. Shortness of breath. EXAM: CT ANGIOGRAPHY CHEST CT ABDOMEN AND PELVIS WITH CONTRAST TECHNIQUE: Multidetector CT imaging of the chest was performed using the standard protocol during bolus administration of intravenous contrast. Multiplanar CT image reconstructions and MIPs were obtained to evaluate the vascular anatomy. Multidetector CT imaging of the abdomen and pelvis was performed using the standard protocol during bolus administration of intravenous contrast. CONTRAST:  100 cc Isovue 370 IV COMPARISON:  CT abdomen pelvis 06/03/2017. FINDINGS: CTA CHEST FINDINGS Cardiovascular: No filling defects in the pulmonary arteries to suggest pulmonary emboli. Heart is normal  size. Aorta is normal caliber. Mediastinum/Nodes: No mediastinal, hilar, or axillary adenopathy. Mild circumferential wall thickening in the distal esophagus. Trachea unremarkable. Lungs/Pleura: Extensive diffuse ground-glass opacities throughout the lungs, most confluent throughout the left lung. Trace bilateral pleural effusions. Musculoskeletal: No acute bony abnormality. Review of the MIP images confirms the above findings. CT ABDOMEN and PELVIS FINDINGS Hepatobiliary: No focal hepatic abnormality. Gallbladder unremarkable. Pancreas: No focal abnormality or ductal dilatation. Spleen: No focal abnormality.  Normal size. Adrenals/Urinary Tract: No adrenal abnormality. No focal renal abnormality. No stones or hydronephrosis. Urinary bladder is unremarkable. Stomach/Bowel: Descending colonic and sigmoid diverticulosis. No active diverticulitis. Normal appendix. No evidence of bowel obstruction. Vascular/Lymphatic: No evidence of aneurysm or adenopathy. Reproductive: Prior hysterectomy.  No adnexal masses. Other: No free fluid or free air. Musculoskeletal: No acute bony abnormality. Review of the MIP images confirms the above findings. IMPRESSION: Diffuse ground-glass airspace opacities and interstitial thickening throughout the lungs, left greater than right. This could reflect edema or pneumonia. Atypical pneumonia is possible. Trace bilateral effusions. No evidence of pulmonary embolus. No acute findings in the abdomen or pelvis. Electronically Signed   By: Rolm Baptise M.D.   On: 09/19/2017 09:15   Ct Abdomen Pelvis W Contrast  Result Date: 09/19/2017 CLINICAL DATA:  Diffuse chest and abdominal pain. Nausea, vomiting. Shortness of breath. EXAM: CT ANGIOGRAPHY CHEST CT ABDOMEN AND PELVIS WITH CONTRAST TECHNIQUE: Multidetector CT imaging of the chest was performed using the standard protocol during bolus administration of intravenous contrast. Multiplanar CT image reconstructions and MIPs were obtained  to  evaluate the vascular anatomy. Multidetector CT imaging of the abdomen and pelvis was performed using the standard protocol during bolus administration of intravenous contrast. CONTRAST:  100 cc Isovue 370 IV COMPARISON:  CT abdomen pelvis 06/03/2017. FINDINGS: CTA CHEST FINDINGS Cardiovascular: No filling defects in the pulmonary arteries to suggest pulmonary emboli. Heart is normal size. Aorta is normal caliber. Mediastinum/Nodes: No mediastinal, hilar, or axillary adenopathy. Mild circumferential wall thickening in the distal esophagus. Trachea unremarkable. Lungs/Pleura: Extensive diffuse ground-glass opacities throughout the lungs, most confluent throughout the left lung. Trace bilateral pleural effusions. Musculoskeletal: No acute bony abnormality. Review of the MIP images confirms the above findings. CT ABDOMEN and PELVIS FINDINGS Hepatobiliary: No focal hepatic abnormality. Gallbladder unremarkable. Pancreas: No focal abnormality or ductal dilatation. Spleen: No focal abnormality.  Normal size. Adrenals/Urinary Tract: No adrenal abnormality. No focal renal abnormality. No stones or hydronephrosis. Urinary bladder is unremarkable. Stomach/Bowel: Descending colonic and sigmoid diverticulosis. No active diverticulitis. Normal appendix. No evidence of bowel obstruction. Vascular/Lymphatic: No evidence of aneurysm or adenopathy. Reproductive: Prior hysterectomy.  No adnexal masses. Other: No free fluid or free air. Musculoskeletal: No acute bony abnormality. Review of the MIP images confirms the above findings. IMPRESSION: Diffuse ground-glass airspace opacities and interstitial thickening throughout the lungs, left greater than right. This could reflect edema or pneumonia. Atypical pneumonia is possible. Trace bilateral effusions. No evidence of pulmonary embolus. No acute findings in the abdomen or pelvis. Electronically Signed   By: Rolm Baptise M.D.   On: 09/19/2017 09:15    EKG: Independently reviewed.    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 Atypical), left greater than right- given recent hospitalization, comorbid conditions and severity of illness with significant leukocytosis and lactic acidosis with cover empirically with Zosyn and vancomycin pending cultures. IV fluid bolus 30 mL per kilo, repeat lactic acid pending Admit to SDU Sepsis order set  IV antibiotics by pharmacy with Vanco and Zosyn  Follow lactic acid q 6 hrs Follow blood culture  IV fluids at 100 cc/h.  Procalcitonin order set   Diabetic Ketoacidosis likely precipitated likely by sepsis as above Admission Glucose was 270 , Anion Gap 21, Lactic Acid 3.74 . UA with ketones.  All other workup pending. Received Ns 30 ml/kg x 1,  Insulin drip initiated. Of note, she had been d/c on Lantus 30 units and Novolog 8 Units and reports taking it prior to admission Admit for SDU DKA order set  UA with cultures ABG repeat in 6 hrs Serial lactic acid  NPO for now  Intractable Nausea and vomiting, likely due to above,  History of IBS CT A/P w/o acute intraabd  findings, WBC 22.9  Bowel rest Scheduled Zofran Gentle IV fluids Advance diet as tolerated  Elevated Troponin   likely due to demand ischemia in the  setting of sepsis. Patient complains of chest  discomfort by likely referred from GI symptoms described above. . Initial Tn 0.28, EKG SR without ACS  CXR NAD. CT angio chest is w/o Pulm Embolism.  Cycle Tn and EKG    Hypothyroidism: Continue home Synthroid  CAD,    Continue ASA, lipitor and Zetia when able to take oral   Hypertension BP   137/124   Pulse  121   Continue home anti-hypertensive medications when able to take po, including metoprolol and Cozaar Use Metoprolol 5 mg g  q 6 IV due to intractable emesis, may switch over to oral metoprolol (home dose) when her oral intake resumes , may use iv Hydralazine Q6 hours as needed for BP 160/90   Depression Continue home Zoloft     DVT prophylaxis:  Lovenox Code Status:    Full  Family Communication:  Discussed with patient Disposition Plan: Expect patient to be discharged to home after condition improves Consults called:    None  Admission status: SDU    EMOKPAE, COURAGE, PA-C [facility]   09/19/2017, 9:55 AM

## 2017-09-19 NOTE — ED Notes (Signed)
Checked patient cbg it was 62 notified RN of blood sugar

## 2017-09-19 NOTE — ED Provider Notes (Signed)
[facility] DEPT Provider Note   CSN: [id number] Arrival date & time: 09/19/17  0430     History   Chief Complaint Chief Complaint  Patient presents with  . Chest Pain  . Nausea  . Emesis    HPI [name] [name] is a 56 y.o. female.  Patient with history of diabetes and gastroparesis discharged from the hospital 12 hours ago presenting with nausea and vomiting that onset last night around 9:30 PM. States she's been vomiting too many times to count is been nonbilious and nonbloody. A few episodes of diarrhea. Has diffuse abdominal tenderness. She believes that she is in DKA again blood sugar was 270 on EMS arrival. Patient is 3 units of her NovoLog insulin tonight. She has diffuse abdominal pain that radiates into her chest. She was given Zofran and aspirin by EMS. Complains of chest pressure and diffuse abdominal tenderness.   The history is provided by the patient and the EMS personnel.  Chest Pain   Associated symptoms include abdominal pain, nausea, vomiting and weakness. Pertinent negatives include no dizziness and no shortness of breath.  Emesis   Associated symptoms include abdominal pain and diarrhea. Pertinent negatives include no arthralgias and no myalgias.    Past Medical History:  Diagnosis Date  . Anxiety   . B12 deficiency   . Chest pain 04/05/2017  . GERD (gastroesophageal reflux disease)   . Hyperlipidemia   . Hypertension    [name]  . Hypothyroidism   . IBS (irritable bowel syndrome)   . Intermittent vertigo   . 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 Leg cramping    "@ night" (09/18/2013)  . Migraine    "once q couple months" (09/18/2013)  . Osteoporosis   . Peripheral neuropathy   . Type II diabetes mellitus ([place])   . Umbilical hernia    unrepaired (09/18/2013)    Patient Active Problem List   Diagnosis Date Noted  . Chest discomfort 09/18/2017  . DKA, type 1 ([place]) 09/17/2017  . DKA (diabetic ketoacidoses) ([place])  06/22/2017  . Phrygian cap 06/20/2017  . Nausea and vomiting 06/04/2017  . Lactic acidosis   . Leukocytosis   . Lower abdominal pain   . Nonischemic cardiomyopathy ([place]) 03/22/2017  . NSTEMI (non-ST elevated myocardial infarction) ([place]) 02/11/2017  . H. pylori infection 02/09/2017  . Gastroparesis due to DM ([place]) 01/22/2017  . Type 2 diabetes mellitus with ketoacidosis without coma, with long-term current use of insulin ([place]) 07/17/2016  . Atrial fibrillation ([place]) 02/01/2015  . 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 Hepatic lesion 09/18/2013  . Vitamin D deficiency 05/20/2012  . Essential hypertension 02/01/2011  . Osteoporosis 02/01/2011  . B12 deficiency 04/04/2010  . Peripheral neuropathy ([place]) 05/10/2009  . GERD 04/08/2009  . Generalized anxiety disorder 06/22/2008  . Hypothyroidism, acquired 04/29/2008  . Hyperlipidemia 04/29/2008  . Irritable bowel syndrome 04/29/2008    Past Surgical History:  Procedure Laterality Date  . CESAREAN SECTION  1989  . LAPAROSCOPIC ASSISTED VAGINAL HYSTERECTOMY  09/23/2012   Procedure: LAPAROSCOPIC ASSISTED VAGINAL HYSTERECTOMY;  Surgeon: Linda Hedges, DO;  Location: [facility];  Service: Gynecology;  Laterality: N/A;  pull Dr Gregor Hams instrument  . LEFT HEART CATH AND CORONARY ANGIOGRAPHY N/A 02/11/2017   Procedure: Left Heart Cath and Coronary Angiography;  Surgeon: Lorretta Harp, MD;  Location: [facility];  Service: Cardiovascular;  Laterality: N/A;  . TUBAL LIGATION Bilateral 1992  . VAGINAL HYSTERECTOMY  2013    OB History    Gravida Para Term Preterm AB Living   2 2           SAB TAB Ectopic Multiple Live Births                   Home Medications    Prior to Admission medications   Medication Sig Start Date End Date Taking? Authorizing Provider  aspirin 81 MG chewable tablet Chew 1 tablet (81 mg total) by mouth daily. 02/12/17   Jani Gravel, MD  atorvastatin (LIPITOR) 80 MG tablet Take 1 tablet  (80 mg total) by mouth daily at 6 PM. 09/04/16   Bedsole, Amy E, MD  cetirizine (ZYRTEC) 10 MG tablet Take 1 tablet (10 mg total) by mouth daily. 08/05/17   Bedsole, Amy E, MD  Continuous Blood Gluc Receiver (FREESTYLE LIBRE READER) DEVI 1 Device by Does not apply route 3 (three) times daily. 04/03/17   Philemon Kingdom, MD  Continuous Blood Gluc Sensor (FREESTYLE LIBRE SENSOR SYSTEM) MISC 1 Device by Does not apply route every 30 (thirty) days. 04/03/17   Philemon Kingdom, MD  ezetimibe (ZETIA) 10 MG tablet Take 1 tablet (10 mg total) by mouth daily. 09/11/16   Bedsole, Amy E, MD  glucose blood (ONETOUCH VERIO) test strip Use to check blood sugar daily as needed.  Dx: E11.10 06/17/17   Jinny Sanders, MD  Insulin Glargine (BASAGLAR KWIKPEN) 100 UNIT/ML SOPN Inject 0.3 mLs (30 Units total) into the skin at bedtime. 09/19/17   [name], [name], MD  insulin lispro (HUMALOG KWIKPEN) 100 UNIT/ML KiwkPen Inject 0.08 mLs (8 Units total) into the skin 3 (three) times daily. 09/18/17   [name], Ripudeep Raliegh Ip, MD  levothyroxine (SYNTHROID, LEVOTHROID) 150 MCG tablet Take 1 tablet (150 mcg total) by mouth daily. 04/03/17   Philemon Kingdom, MD  losartan (COZAAR) 25 MG tablet Take 25 mg by mouth daily. 05/27/17   [provider]  metoCLOPramide (REGLAN) 5 MG tablet TAKE 1 TABLET BY MOUTH 4 TIMES A DAY BEFORE MEALS AND AT BEDTIME 09/18/17   [name], [name], MD  metoprolol succinate (TOPROL-XL) 25 MG 24 hr tablet Take 25 mg by mouth daily. 03/24/17   [provider]  ondansetron (ZOFRAN) 4 MG tablet TAKE 1 TABLET BY MOUTH EVERY 6 HOURS AS NEEDED FOR NAUSEA AND VOMITING 09/18/17   [name], [name], MD  RELPAX 40 MG tablet ONE TABLET BY MOUTH AT ONSET OF HEADACHE. MAY REPEAT IN 2 HOURS IF HEADACHE PERSISTS OR RECURS. 10/17/16   Bedsole, Amy E, MD  sertraline (ZOLOFT) 50 MG tablet Take 1 tablet (50 mg total) by mouth daily. 09/02/17   Jinny Sanders, MD    Family History Family History  Problem Relation Age of  Onset  . Diabetes Other   . Heart disease Other   . Heart failure Mother   . Diabetes Maternal Grandmother   . Alzheimer's disease Maternal Grandmother   . Coronary artery disease Maternal Grandmother   . Heart attack Maternal Grandfather   . Heart failure Brother     Social History Social History  Substance Use Topics  . Smoking status: Former Smoker    Years: 0.20    Types: Cigarettes    Quit date: 12/10/1990  . Smokeless tobacco: Never Used  . Alcohol use No     Allergies   Codeine   Review of Systems Review of Systems  Constitutional: Positive for activity change, appetite change and fatigue.  HENT:  Positive for rhinorrhea. Negative for congestion.   Respiratory: Positive for chest tightness. Negative for shortness of breath.   Cardiovascular: Positive for chest pain.  Gastrointestinal: Positive for abdominal pain, diarrhea, nausea and vomiting.  Genitourinary: Negative for dysuria, vaginal bleeding and vaginal discharge.  Musculoskeletal: Negative for arthralgias and myalgias.  Neurological: Positive for weakness. Negative for dizziness.   all other systems are negative except as noted in the HPI and PMH.     Physical Exam Updated Vital Signs BP (!) 137/124   Pulse (!) 121   Temp (!) 97.5 F (36.4 C) (Oral)   Resp (!) 23   Ht 4\' 11"  (1.499 m)   Wt 57.6 kg (127 lb)   LMP 08/11/2012   SpO2 100%   BMI 25.65 kg/m   Physical Exam  Constitutional: She is oriented to person, place, and time. She appears well-developed and well-nourished. No distress.  Tachycardic Dry mucus membranes  HENT:  Head: Normocephalic and atraumatic.  Mouth/Throat: Oropharynx is clear and moist. No oropharyngeal exudate.  Eyes: Pupils are equal, round, and reactive to light. Conjunctivae and EOM are normal.  Neck: Normal range of motion. Neck supple.  No meningismus.  Cardiovascular: Normal rate, normal heart sounds and intact distal pulses.   No murmur heard. tachycardic    Pulmonary/Chest: Effort normal and breath sounds normal. No respiratory distress. She exhibits tenderness.  Abdominal: Soft. There is tenderness. There is no rebound and no guarding.  Diffuse abdominal tenderness   Musculoskeletal: Normal range of motion. She exhibits no edema or tenderness.  Neurological: She is alert and oriented to person, place, and time. No cranial nerve deficit. She exhibits normal muscle tone. Coordination normal.   5/5 strength throughout. CN 2-12 intact.Equal grip strength.   Skin: Skin is warm.  Psychiatric: She has a normal mood and affect. Her behavior is normal.  Nursing note and vitals reviewed.    ED Treatments / Results  Labs (all labs ordered are listed, but only abnormal results are displayed) Labs Reviewed  BASIC METABOLIC PANEL - Abnormal; Notable for the following:       Result Value   Sodium 134 (*)    Chloride 96 (*)    CO2 17 (*)    Glucose, Bld 271 (*)    Anion gap 21 (*)    All other components within normal limits  CBC - Abnormal; Notable for the following:    WBC 22.9 (*)    RBC 5.38 (*)    Hemoglobin 15.9 (*)    All other components within normal limits  URINALYSIS, ROUTINE W REFLEX MICROSCOPIC - Abnormal; Notable for the following:    Glucose, UA >=500 (*)    Hgb urine dipstick SMALL (*)    Ketones, ur 80 (*)    Protein, ur 100 (*)    Bacteria, UA RARE (*)    Squamous Epithelial / LPF 0-5 (*)    All other components within normal limits  TROPONIN I - Abnormal; Notable for the following:    Troponin I 0.28 (*)    All other components within normal limits  CBG MONITORING, ED - Abnormal; Notable for the following:    Glucose-Capillary 270 (*)    All other components within normal limits  I-STAT TROPONIN, ED - Abnormal; Notable for the following:    Troponin i, poc 0.30 (*)    All other components within normal limits  I-STAT CG4 LACTIC ACID, ED - Abnormal; Notable for the following:    Lactic Acid, Venous 3.74 (*)  All  other components within normal limits  I-STAT VENOUS BLOOD GAS, ED - Abnormal; Notable for the following:    pH, Ven 7.449 (*)    pCO2, Ven 30.8 (*)    pO2, Ven 49.0 (*)    All other components within normal limits  CBG MONITORING, ED - Abnormal; Notable for the following:    Glucose-Capillary 261 (*)    All other components within normal limits  CULTURE, BLOOD (ROUTINE X 2)  CULTURE, BLOOD (ROUTINE X 2)  D-DIMER, QUANTITATIVE (NOT AT [facility])    EKG  EKG Interpretation  Date/Time:  Thursday September 19 2017 04:40:55 EDT Ventricular Rate:  98 PR Interval:    QRS Duration: 92 QT Interval:  366 QTC Calculation: 468 R Axis:   93 Text Interpretation:  Sinus rhythm Lateral infarct, old Rate slower Confirmed by Ezequiel Essex [phone]) on 09/19/2017 4:55:19 AM       Radiology Dg Chest 2 View  Result Date: 09/17/2017 CLINICAL DATA:  Generalized chest pain, shortness of breath, nausea, and vomiting for 1 day, history hypertension, diabetes mellitus, former smoker EXAM: CHEST  2 VIEW COMPARISON:  06/22/2017 FINDINGS: Normal heart size, mediastinal contours, and pulmonary vascularity. Lungs hyperinflated but clear. No pulmonary infiltrate, pleural effusion or pneumothorax. Numerous EKG leads project over chest particularly RIGHT lung. No acute osseous findings. IMPRESSION: Hyperinflated lungs without infiltrate. Electronically Signed   By: Lavonia Dana M.D.   On: 09/17/2017 20:36    Procedures Procedures (including critical care time)  Medications Ordered in ED Medications  sodium chloride 0.9 % bolus 1,000 mL (not administered)  ondansetron (ZOFRAN) injection 4 mg (not administered)  metoCLOPramide (REGLAN) injection 10 mg (not administered)     Initial Impression / Assessment and Plan / ED Course  I have reviewed the triage vital signs and the nursing notes.  Pertinent labs & imaging results that were available during my care of the patient were reviewed by me and considered in my  medical decision making (see chart for details).    Patient discharged 12 hours ago with DKA presented with recurrent nausea vomiting or abdominal pain. Pain radiates to her chest. Sinus tachycardia on arrival.  Abdomen is diffusely tender but soft.  Patient given IV fluids and antiemetics. Labs consistent with DKA. Patient started on IV fluids and IV insulin. Ketones in urine. No infection. CXR negative.  Troponin is positive. Patient denies chest pain at this time. EKG is nonischemic. She had a reassuring cardiac catheterization in March 2018.  With persistent tachycardia and tachypnea, d-dimer will be sent. Patient also developed some hypoxia after receiving medications.  CT scan will be obtained to evaluate for pulmonary embolism as well as CT abdomen given her worsening leukocytosis.  Admission d/w Dr. Tamala Julian.  CRITICAL CARE Performed by: Ezequiel Essex Total critical care time: 45 minutes Critical care time was exclusive of separately billable procedures and treating other patients. Critical care was necessary to treat or prevent imminent or life-threatening deterioration. Critical care was time spent personally by me on the following activities: development of treatment plan with patient and/or surrogate as well as nursing, discussions with consultants, evaluation of patient's response to treatment, examination of patient, obtaining history from patient or surrogate, ordering and performing treatments and interventions, ordering and review of laboratory studies, ordering and review of radiographic studies, pulse oximetry and re-evaluation of patient's condition.   Final Clinical Impressions(s) / ED Diagnoses   Final diagnoses:  Diabetic ketoacidosis without coma associated with diabetes mellitus due to underlying condition (  [place])  Elevated troponin    New Prescriptions New Prescriptions   No medications on file     Ezequiel Essex, MD 09/19/17 [phone]

## 2017-09-19 NOTE — ED Notes (Signed)
Pt offered food and fpo fluids. Refuses food but sttes will try fluids.

## 2017-09-19 NOTE — ED Notes (Signed)
Dr Rodell Perna paged. For increased troponin

## 2017-09-19 NOTE — Plan of Care (Signed)
Patient just discharged from hospital for N/V/abd pain and DKA; who presents for the same. WBC 22.9, Lactic acid 3.74, trop 0.3, CBG 271 with AG 21. UA + ketones. Last cardiac catheterization was 6 months which patient was noted to have clean coronaries. Admit patient to stepdown.

## 2017-09-19 NOTE — ED Notes (Signed)
Per conversation wiuth Garry Heater pa, will give ABX prior to potassium.

## 2017-09-19 NOTE — ED Notes (Signed)
Pt offeredd po medication and states she uis unable to take them at this time. Feels that she will vomit. Aske tyo wait a little longer as nausea has imporved. Denies pain.

## 2017-09-19 NOTE — Progress Notes (Signed)
Pharmacy Antibiotic Note  [name] [name] is a 56 y.o. female admitted on 09/19/2017 with intra-abdominal infection/sepsis.  Pharmacy has been consulted for zosyn and vancomycin dosing. WBC elevated at 22.9, LA 3.74, SCr 0.83, CrCl ~ 85 mL/min   Plan: -Zosyn 3.375 gm IV Q 8 hours -Vancomycin 1250 mg IV once, then vancomycin 500 mg IV Q 8 hours -Monitor CBC, renal fx, cultures and clinical progress -VT at SS   Height: 4\' 11"  (149.9 cm) Weight: 127 lb (57.6 kg) IBW/kg (Calculated) : 43.2  Temp (24hrs), Avg:98.2 F (36.8 C), Min:97.5 F (36.4 C), Max:98.8 F (37.1 C)   Recent Labs Lab 09/17/17 1937 09/17/17 2131 09/18/17 0059 09/18/17 0533 09/18/17 0853 09/19/17 0454 09/19/17 0511  WBC 16.4*  --   --  12.7*  --  22.9*  --   CREATININE 0.80  --  0.60 0.55 0.52 0.83  --   LATICACIDVEN  --  2.46*  --   --   --   --  3.74*    Estimated Creatinine Clearance: 59.9 mL/min (by C-G formula based on SCr of 0.83 mg/dL).    Allergies  Allergen Reactions  . Codeine Hives, Anxiety and Other (See Comments)    Antimicrobials this admission: Zosyn 10/11 >>  Vancomycin 10/11 >>   Dose adjustments this admission: None   Microbiology results: 10/11 BCx:  10/11 MRSA PCR:   Thank you for allowing pharmacy to be a part of this patient's care.  Albertina Parr, PharmD., BCPS Clinical Pharmacist Pager [phone]

## 2017-09-19 NOTE — Progress Notes (Addendum)
Inpatient Diabetes Program Recommendations  AACE/ADA: New Consensus Statement on Inpatient Glycemic Control (2015)  Target Ranges:  Prepandial:   less than 140 mg/dL      Peak postprandial:   less than 180 mg/dL (1-2 hours)      Critically ill patients:  140 - 180 mg/dL   Review of Glycemic Control  Diabetes history: DM 2 Outpatient Diabetes medications: Basaglar 30 units, Humalog 12-15 units tid with meals Current orders for Inpatient glycemic control: IV insulin, Basaglar 30 units  A1c 10.2% on 09/18/17  Inpatient Diabetes Program Recommendations:    0800 am:   CO2 14, Anion Gap 19, Glucose 272 mg/dl. Recommend patient to be on IV insulin until CO2 and Anion gap normalizes per protocol. Patient complaining of Nausea still.   Basaglar is not on [facility]'s Formulary for Adult patient's. At time of transition please consider: Lantus 30 units (administer 2 hours before d/cing IV insulin) Novolog Moderate Correction 0-15 units tid Novolog 0-5 units HS scale Novolog 5 units tid meal coverage in addition to correction scale (give only if patient is eating at least 50% of meals).  Saw patient at bedside in the ED. Patient sees Dr. Cruzita Lederer, Endocrinologist, outpatient for DM management. Patient reports seeing Dr. Cruzita Lederer every 3 months. Patient reports her next appointment is on October 23rd she thinks and she says Dr. Cruzita Lederer is testing her to see if she is transitioning into a type 1, insulin dependent. Patient reports going to nutritional education. Patient says she checks her glucose at home and she normally runs into the 200's.   Will monitor patient while here and make recommendations as needed.  Thanks,  Tama Headings RN, MSN, [facility] Inpatient Diabetes Coordinator Team Pager [phone] (8a-5p)

## 2017-09-19 NOTE — ED Notes (Signed)
Pt's SpO2-88% on room air, pt placed on 2L [place], Dr. Wyvonnia Dusky aware.

## 2017-09-19 NOTE — ED Notes (Addendum)
Called from CT where pt refuses CT withput pain meds. Dr contacted and order obtained. Mesdcated pt in CT.morphine 2 mg iv qand wasted there.

## 2017-09-19 NOTE — ED Notes (Signed)
Pt again offered po 1400 meds and again refused.

## 2017-09-19 NOTE — Progress Notes (Signed)
Patient admitted from ED with DKA. Alert and oriented to room and surrounding. Family at bedside. Telemetry applied and CCMD called.

## 2017-09-19 NOTE — ED Triage Notes (Signed)
Per EMS, pt from home. Pt was discharged from hospital yesterday and diagnosed with DKA. Pt c/o chest pressure that she states started before being discharged from hospital and N/Vx13 that started at 9:30p last night. Pt reports taking zofran and TUMS w/ no relief. Pt took 3 units of insulin at 1:30am and 324 of ASA. Pt reports some abd pain on palpation.   EMS VS BP 130/100, 100 HR, R 14, 97% RA, and CBG 267.

## 2017-09-19 NOTE — ED Notes (Signed)
Pt c/o continued nausea/vomiting. Dr. Wyvonnia Dusky aware.

## 2017-09-20 ENCOUNTER — Inpatient Hospital Stay (HOSPITAL_COMMUNITY): Payer: 59

## 2017-09-20 DIAGNOSIS — F411 Generalized anxiety disorder: Secondary | ICD-10-CM

## 2017-09-20 DIAGNOSIS — E781 Pure hyperglyceridemia: Secondary | ICD-10-CM

## 2017-09-20 DIAGNOSIS — E0810 Diabetes mellitus due to underlying condition with ketoacidosis without coma: Secondary | ICD-10-CM

## 2017-09-20 DIAGNOSIS — R9431 Abnormal electrocardiogram [ECG] [EKG]: Secondary | ICD-10-CM

## 2017-09-20 DIAGNOSIS — R112 Nausea with vomiting, unspecified: Secondary | ICD-10-CM

## 2017-09-20 DIAGNOSIS — R748 Abnormal levels of other serum enzymes: Secondary | ICD-10-CM

## 2017-09-20 LAB — BASIC METABOLIC PANEL
Anion gap: 12 (ref 5–15)
Anion gap: 10 (ref 5–15)
BUN: 13 mg/dL (ref 6–20)
BUN: 13 mg/dL (ref 6–20)
CHLORIDE: 106 mmol/L (ref 101–111)
CO2: 18 mmol/L — ABNORMAL LOW (ref 22–32)
CO2: 18 mmol/L — ABNORMAL LOW (ref 22–32)
CREATININE: 0.63 mg/dL (ref 0.44–1.00)
Calcium: 7.9 mg/dL — ABNORMAL LOW (ref 8.9–10.3)
Calcium: 8.1 mg/dL — ABNORMAL LOW (ref 8.9–10.3)
Chloride: 105 mmol/L (ref 101–111)
Creatinine, Ser: 0.59 mg/dL (ref 0.44–1.00)
GFR calc Af Amer: >60 mL/min (ref >60)
GFR calc non Af Amer: >60 mL/min (ref >60)
GLUCOSE: 160 mg/dL — AB (ref 65–99)
Glucose, Bld: 131 mg/dL — ABNORMAL HIGH (ref 65–99)
POTASSIUM: 3.3 mmol/L — AB (ref 3.5–5.1)
Potassium: 3 mmol/L — ABNORMAL LOW (ref 3.5–5.1)
SODIUM: 135 mmol/L (ref 135–145)
SODIUM: 134 mmol/L — AB (ref 135–145)

## 2017-09-20 LAB — CBC
HEMATOCRIT: 44 % (ref 36.0–46.0)
HEMOGLOBIN: 15 g/dL (ref 12.0–15.0)
MCH: 28.6 pg (ref 26.0–34.0)
MCHC: 34.1 g/dL (ref 30.0–36.0)
MCV: 84 fL (ref 78.0–100.0)
Platelets: 328 10*3/uL (ref 150–400)
RBC: 5.24 MIL/uL — ABNORMAL HIGH (ref 3.87–5.11)
RDW: 13.9 % (ref 11.5–15.5)
WBC: 27.5 10*3/uL — ABNORMAL HIGH (ref 4.0–10.5)

## 2017-09-20 LAB — GLUCOSE, CAPILLARY
GLUCOSE-CAPILLARY: 122 mg/dL — AB (ref 65–99)
GLUCOSE-CAPILLARY: 122 mg/dL — AB (ref 65–99)
GLUCOSE-CAPILLARY: 128 mg/dL — AB (ref 65–99)
GLUCOSE-CAPILLARY: 129 mg/dL — AB (ref 65–99)
GLUCOSE-CAPILLARY: 144 mg/dL — AB (ref 65–99)
GLUCOSE-CAPILLARY: 148 mg/dL — AB (ref 65–99)
GLUCOSE-CAPILLARY: 161 mg/dL — AB (ref 65–99)
GLUCOSE-CAPILLARY: 168 mg/dL — AB (ref 65–99)
GLUCOSE-CAPILLARY: 171 mg/dL — AB (ref 65–99)
GLUCOSE-CAPILLARY: 184 mg/dL — AB (ref 65–99)
GLUCOSE-CAPILLARY: 184 mg/dL — AB (ref 65–99)
Glucose-Capillary: 135 mg/dL — ABNORMAL HIGH (ref 65–99)
Glucose-Capillary: 144 mg/dL — ABNORMAL HIGH (ref 65–99)
Glucose-Capillary: 154 mg/dL — ABNORMAL HIGH (ref 65–99)
Glucose-Capillary: 163 mg/dL — ABNORMAL HIGH (ref 65–99)
Glucose-Capillary: 164 mg/dL — ABNORMAL HIGH (ref 65–99)
Glucose-Capillary: 166 mg/dL — ABNORMAL HIGH (ref 65–99)
Glucose-Capillary: 171 mg/dL — ABNORMAL HIGH (ref 65–99)
Glucose-Capillary: 178 mg/dL — ABNORMAL HIGH (ref 65–99)

## 2017-09-20 LAB — COMPREHENSIVE METABOLIC PANEL
ALBUMIN: 3.1 g/dL — AB (ref 3.5–5.0)
ALT: 13 U/L — ABNORMAL LOW (ref 14–54)
ANION GAP: 13 (ref 5–15)
AST: 17 U/L (ref 15–41)
Alkaline Phosphatase: 61 U/L (ref 38–126)
BILIRUBIN TOTAL: 1.1 mg/dL (ref 0.3–1.2)
BUN: 14 mg/dL (ref 6–20)
CHLORIDE: 105 mmol/L (ref 101–111)
CO2: 19 mmol/L — AB (ref 22–32)
Calcium: 8.3 mg/dL — ABNORMAL LOW (ref 8.9–10.3)
Creatinine, Ser: 0.7 mg/dL (ref 0.44–1.00)
GFR calc Af Amer: >60 mL/min (ref >60)
GFR calc non Af Amer: >60 mL/min (ref >60)
GLUCOSE: 173 mg/dL — AB (ref 65–99)
POTASSIUM: 3.2 mmol/L — AB (ref 3.5–5.1)
SODIUM: 137 mmol/L (ref 135–145)
TOTAL PROTEIN: 5.8 g/dL — AB (ref 6.5–8.1)

## 2017-09-20 LAB — PROTIME-INR
INR: 1.21
Prothrombin Time: 15.2 seconds (ref 11.4–15.2)

## 2017-09-20 LAB — TROPONIN I: Troponin I: 0.12 ng/mL (ref <0.03)

## 2017-09-20 LAB — ECHOCARDIOGRAM COMPLETE
HEIGHTINCHES: 59 in
WEIGHTICAEL: 2096 [oz_av]

## 2017-09-20 MED ORDER — POTASSIUM CHLORIDE 10 MEQ/100ML IV SOLN
10.0000 meq | INTRAVENOUS | Status: AC
  Administered 2017-09-20 (×4): 10 meq via INTRAVENOUS
  Filled 2017-09-20 (×3): qty 100

## 2017-09-20 MED ORDER — INSULIN ASPART 100 UNIT/ML ~~LOC~~ SOLN
0.0000 [IU] | Freq: Three times a day (TID) | SUBCUTANEOUS | Status: DC
  Administered 2017-09-22: 2 [IU] via SUBCUTANEOUS
  Administered 2017-09-22: 3 [IU] via SUBCUTANEOUS
  Administered 2017-09-23 (×2): 2 [IU] via SUBCUTANEOUS

## 2017-09-20 MED ORDER — INSULIN ASPART 100 UNIT/ML ~~LOC~~ SOLN: 0.0000 [IU] | Freq: Every day | SUBCUTANEOUS | Status: DC

## 2017-09-20 MED ORDER — INSULIN GLARGINE 100 UNIT/ML ~~LOC~~ SOLN
20.0000 [IU] | Freq: Once | SUBCUTANEOUS | Status: AC
  Administered 2017-09-20: 20 [IU] via SUBCUTANEOUS
  Filled 2017-09-20: qty 0.2

## 2017-09-20 MED ORDER — INSULIN ASPART 100 UNIT/ML ~~LOC~~ SOLN
8.0000 [IU] | Freq: Three times a day (TID) | SUBCUTANEOUS | Status: DC
  Administered 2017-09-21: 8 [IU] via SUBCUTANEOUS

## 2017-09-20 MED ORDER — SODIUM CHLORIDE 0.9 % IV SOLN
INTRAVENOUS | Status: DC
  Administered 2017-09-20: 19:00:00 via INTRAVENOUS

## 2017-09-20 MED ORDER — INSULIN GLARGINE 100 UNIT/ML ~~LOC~~ SOLN: 8.0000 [IU] | Freq: Once | SUBCUTANEOUS | Status: DC

## 2017-09-20 MED ORDER — INSULIN LISPRO 100 UNIT/ML (KWIKPEN): 8.0000 [IU] | PEN_INJECTOR | Freq: Three times a day (TID) | SUBCUTANEOUS | Status: DC

## 2017-09-20 MED ORDER — POTASSIUM CHLORIDE CRYS ER 20 MEQ PO TBCR: 40.0000 meq | EXTENDED_RELEASE_TABLET | Freq: Once | ORAL | Status: DC

## 2017-09-20 NOTE — Progress Notes (Signed)
PT Cancellation Note  Patient Details Name: [name] [name] MRN: [id number] DOB: 17-Nov-1961   Cancelled Treatment:    Reason Eval/Treat Not Completed: Medical issues which prohibited therapy Pt with troponins trending upward - awaiting clarification from MD re: activity.   Lorriane Shire 09/20/2017, 12:11 PM

## 2017-09-20 NOTE — Progress Notes (Signed)
[place] TEAM 1 - Stepdown/ICU TEAM  [name] [name]  ZOX:[id number] DOB: Jun 03, 1961 DOA: 09/19/2017 PCP: Jinny Sanders, MD    Brief Narrative:  56 y.o. female with a history of GERD, HTN, HLD, hypothyroidism, gastroparesis, and DM with multiple admissions for DKA (most recently 10/9 > 10/10) who presented with nausea and vomiting and lactic acidosis. Patient has a history of medicine non compliance.   Subjective: The patient is resting comfortably in bed.  She denies any pulmonary symptoms at this time.  She reports some nausea with abdominal cramping this morning but states this is gotten better with nausea medicine.  She denies substernal chest pressure.  Assessment & Plan:  Possible atypical PNA CT chest noted diffuse ground-glass airspace opacities and interstitial thickening throughout the lungs, left greater than right - this could simply be edema - procalcitonin is reassuring at <0.10 - cont empiric abx for now w/ low threshold to d/c after 3 days of tx if continues to improve clinically   DKA - uncontrolled DM Due to noncompliance with insulin - recheck bmet now - if anion gap remains closed and bicarbonate is 18 or greater will discontinue insulin drip  Lactic acidosis  Due to above - continue to hydrate  Hypokalemia  Due to treatment of DKA - supplement and follow  Diabetic gastroparesis  primary treatment will be control of CBG  Elevated Troponin - NO hx of CAD Most c/w demand ischemia -  recheck troponin now to assure has peaked - no symptoms to suggest USAP presently - continue ASA, lipitor and Zetia - normal coronaries per cardiac cath March 2018  Hypothyroidism Continue home Synthroid  HTN Continue home anti-hypertensive medications - blood pressure presently controlled   Depression Continue home Zoloft   DVT prophylaxis: lovenox  Code Status: FULL CODE Family Communication: no family present at time of exam  Disposition Plan:   Consultants:    none  Procedures: none  Antimicrobials:  Zosyn 10/11 > Vancomycin 10/11 > 10/12  Objective: Blood pressure (!) 122/92, pulse (!) 105, temperature 98.1 F (36.7 C), temperature source Oral, resp. rate 18, height 4\' 11"  (1.499 m), weight 59.4 kg (131 lb), last menstrual period 08/11/2012, SpO2 93 %.  Intake/Output Summary (Last 24 hours) at 09/20/17 1547 Last data filed at 09/20/17 1300  Gross per 24 hour  Intake               35 ml  Output              960 ml  Net             -925 ml   Filed Weights   09/19/17 0442 09/20/17 0422  Weight: 57.6 kg (127 lb) 59.4 kg (131 lb)    Examination: General: No acute respiratory distress Lungs: Clear to auscultation bilaterally without wheezes or crackles Cardiovascular: Regular rate and rhythm without murmur gallop or rub normal S1 and S2 Abdomen: Nontender, nondistended, soft, bowel sounds positive, no rebound, no ascites, no appreciable mass Extremities: No significant cyanosis, clubbing, or edema bilateral lower extremities  CBC:  Recent Labs Lab 09/17/17 1937 09/18/17 0533 09/19/17 0454 09/20/17 0410  WBC 16.4* 12.7* 22.9* 27.5*  NEUTROABS  --  8.0*  --   --   HGB 15.0 12.4 15.9* 15.0  HCT 42.2 36.2 44.7 44.0  MCV 83.1 83.0 83.1 84.0  PLT 369 278 382 811   Basic Metabolic Panel:  Recent Labs Lab 09/19/17 0752 09/19/17 1039 09/19/17 1702 09/20/17 0118 09/20/17  0410  NA 135 137 139 135 137  K 3.1* 3.0* 3.2* 3.0* 3.2*  CL 102 109 109 105 105  CO2 14* 16* 21* 18* 19*  GLUCOSE 272* 219* 140* 160* 173*  BUN 12 11 11 13 14   CREATININE 0.78 0.70 0.55 0.63 0.70  CALCIUM 8.4* 8.0* 8.0* 7.9* 8.3*   GFR: Estimated Creatinine Clearance: 63.1 mL/min (by C-G formula based on SCr of 0.7 mg/dL).  Liver Function Tests:  Recent Labs Lab 09/18/17 0058 09/20/17 0410  AST 17 17  ALT 12* 13*  ALKPHOS 57 61  BILITOT 0.5 1.1  PROT 5.9* 5.8*  ALBUMIN 3.4* 3.1*    Recent Labs Lab 09/18/17 0058  LIPASE 20    Coagulation Profile:  Recent Labs Lab 09/20/17 0410  INR 1.21    Cardiac Enzymes:  Recent Labs Lab 09/18/17 0533 09/18/17 0853 09/19/17 0454 09/19/17 0752 09/19/17 1820  TROPONINI <0.03 <0.03 0.28* 0.49* 0.68*    HbA1C: Hgb A1c MFr Bld  Date/Time Value Ref Range Status  09/18/2017 05:33 AM 10.2 (H) 4.8 - 5.6 % Final    Comment:    (NOTE) Pre diabetes:          5.7%-6.4% Diabetes:              >6.4% Glycemic control for   <7.0% adults with diabetes   04/05/2017 11:49 AM 10.8 (H) 4.8 - 5.6 % Final    Comment:    (NOTE)         Pre-diabetes: 5.7 - 6.4         Diabetes: >6.4         Glycemic control for adults with diabetes: <7.0     CBG:  Recent Labs Lab 09/20/17 0912 09/20/17 1016 09/20/17 1102 09/20/17 1304 09/20/17 1404  GLUCAP 171* 171* 184* 163* 184*    Recent Results (from the past 240 hour(s))  MRSA PCR Screening     Status: None   Collection Time: 09/17/17 11:57 PM  Result Value Ref Range Status   MRSA by PCR NEGATIVE NEGATIVE Final    Comment:        The GeneXpert MRSA Assay (FDA approved for NASAL specimens only), is one component of a comprehensive MRSA colonization surveillance program. It is not intended to diagnose MRSA infection nor to guide or monitor treatment for MRSA infections.   Culture, blood (routine x 2)     Status: None (Preliminary result)   Collection Time: 09/19/17  7:00 AM  Result Value Ref Range Status   Specimen Description BLOOD RIGHT WRIST  Final   Special Requests   Final    BOTTLES DRAWN AEROBIC AND ANAEROBIC Blood Culture adequate volume   Culture NO GROWTH 1 DAY  Final   Report Status PENDING  Incomplete  Culture, blood (routine x 2)     Status: None (Preliminary result)   Collection Time: 09/19/17  7:41 AM  Result Value Ref Range Status   Specimen Description BLOOD LEFT HAND  Final   Special Requests   Final    BOTTLES DRAWN AEROBIC ONLY Blood Culture adequate volume   Culture NO GROWTH 1 DAY   Final   Report Status PENDING  Incomplete     Scheduled Meds: . aspirin  81 mg Oral Daily  . atorvastatin  80 mg Oral q1800  . enoxaparin (LOVENOX) injection  40 mg Subcutaneous Q24H  . ezetimibe  10 mg Oral Daily  . levothyroxine  150 mcg Oral Daily  . loratadine  10 mg  Oral Daily  . losartan  25 mg Oral Daily  . metoprolol tartrate  5 mg Intravenous Q6H  . sertraline  50 mg Oral Daily     LOS: 1 day   Cherene Altes, MD [facility] Office  [phone] Pager - Text Page per Amion as per below:  On-Call/Text Page:      [email or web address]      password [email or web address]  If 7PM-7AM, please contact night-coverage [email or web address] Password [email or web address] 09/20/2017, 3:47 PM

## 2017-09-20 NOTE — Progress Notes (Signed)
OT Cancellation Note  Patient Details Name: [name] [name] MRN: [id number] DOB: October 10, 1961   Cancelled Treatment:    Reason Eval/Treat Not Completed: Medical issues which prohibited therapy.  Pt with troponins trending upward - awaiting clarification from MD re: activity.  Will reattempt.  [place], OTR/L [phone]   Lucille Passy M 09/20/2017, 11:53 AM

## 2017-09-20 NOTE — Progress Notes (Signed)
  Echocardiogram 2D Echocardiogram has been performed.  [name] [name] 09/20/2017, 9:45 AM

## 2017-09-20 NOTE — Care Management Note (Signed)
Case Management Note Marvetta Gibbons RN, BSN Unit 4E-Case Manager [phone]  Patient Details  Name: [name] [name] MRN: [id number] Date of Birth: 02-Mar-1961  Subjective/Objective:   Pt admitted with DKA, ?PNA                 Action/Plan: PTA pt lived at home with spouse-  Was just d/c'd on 10/10- and had f/u for 10/23 at 3:15 with Dr. Cruzita Lederer-  CM will follow for d/c needs  Expected Discharge Date:                  Expected Discharge Plan:  Home/Self Care  In-House Referral:     Discharge planning Services  CM Consult  Post Acute Care Choice:    Choice offered to:     DME Arranged:    DME Agency:     HH Arranged:    HH Agency:     Status of Service:  In process, will continue to follow  If discussed at Long Length of Stay Meetings, dates discussed:    Discharge Disposition:   Additional Comments:  Dawayne Patricia, RN 09/20/2017, 12:07 PM

## 2017-09-20 NOTE — Progress Notes (Signed)
Inpatient Diabetes Program Recommendations  AACE/ADA: New Consensus Statement on Inpatient Glycemic Control (2015)  Target Ranges:  Prepandial:   less than 140 mg/dL      Peak postprandial:   less than 180 mg/dL (1-2 hours)      Critically ill patients:  140 - 180 mg/dL   Lab Results  Component Value Date   GLUCAP 184 (H) 09/20/2017   HGBA1C 10.2 (H) 09/18/2017    Review of Glycemic Control  Inpatient Diabetes Program Recommendations:    When transitioning from IV insulin: Lantus 30 units (administer 2 hours before d/cing IV insulin) Novolog Moderate Correction 0-15 units tid (cover @ time of IV insulin discontinued) Novolog 0-5 units HS scale Novolog 5 units tid meal coverage in addition to correction scale (give only if patient is eating at least 50% of meals).  Thank you, Nani Gasser. Baylee Mccorkel, RN, MSN, CDE  Diabetes Coordinator Inpatient Glycemic Control Team Team Pager [phone] (8am-5pm) 09/20/2017 2:20 PM

## 2017-09-21 DIAGNOSIS — K219 Gastro-esophageal reflux disease without esophagitis: Secondary | ICD-10-CM

## 2017-09-21 LAB — GLUCOSE, CAPILLARY
GLUCOSE-CAPILLARY: 112 mg/dL — AB (ref 65–99)
Glucose-Capillary: 78 mg/dL (ref 65–99)
Glucose-Capillary: 75 mg/dL (ref 65–99)
Glucose-Capillary: 82 mg/dL (ref 65–99)
Glucose-Capillary: 112 mg/dL — ABNORMAL HIGH (ref 65–99)
Glucose-Capillary: 84 mg/dL (ref 65–99)

## 2017-09-21 LAB — CBC
HEMATOCRIT: 39.9 % (ref 36.0–46.0)
HEMOGLOBIN: 13.6 g/dL (ref 12.0–15.0)
MCH: 28.5 pg (ref 26.0–34.0)
MCHC: 34.1 g/dL (ref 30.0–36.0)
MCV: 83.5 fL (ref 78.0–100.0)
PLATELETS: 290 10*3/uL (ref 150–400)
RBC: 4.78 MIL/uL (ref 3.87–5.11)
RDW: 14.5 % (ref 11.5–15.5)
WBC: 18.9 10*3/uL — AB (ref 4.0–10.5)

## 2017-09-21 LAB — COMPREHENSIVE METABOLIC PANEL
ALT: 11 U/L — ABNORMAL LOW (ref 14–54)
AST: 14 U/L — ABNORMAL LOW (ref 15–41)
Albumin: 2.5 g/dL — ABNORMAL LOW (ref 3.5–5.0)
Alkaline Phosphatase: 47 U/L (ref 38–126)
Anion gap: 9 (ref 5–15)
BUN: 10 mg/dL (ref 6–20)
CHLORIDE: 105 mmol/L (ref 101–111)
CO2: 19 mmol/L — AB (ref 22–32)
Calcium: 7.7 mg/dL — ABNORMAL LOW (ref 8.9–10.3)
Creatinine, Ser: 0.51 mg/dL (ref 0.44–1.00)
Glucose, Bld: 105 mg/dL — ABNORMAL HIGH (ref 65–99)
POTASSIUM: 3.2 mmol/L — AB (ref 3.5–5.1)
SODIUM: 133 mmol/L — AB (ref 135–145)
Total Bilirubin: 1.1 mg/dL (ref 0.3–1.2)
Total Protein: 4.8 g/dL — ABNORMAL LOW (ref 6.5–8.1)

## 2017-09-21 LAB — TROPONIN I: Troponin I: 0.1 ng/mL (ref <0.03)

## 2017-09-21 LAB — PROCALCITONIN: Procalcitonin: <0.1 ng/mL

## 2017-09-21 MED ORDER — INSULIN GLARGINE 100 UNIT/ML ~~LOC~~ SOLN
20.0000 [IU] | Freq: Every day | SUBCUTANEOUS | Status: DC
  Filled 2017-09-21 (×2): qty 0.2

## 2017-09-21 MED ORDER — BASAGLAR KWIKPEN 100 UNIT/ML ~~LOC~~ SOPN: 30.0000 [IU] | PEN_INJECTOR | Freq: Every day | SUBCUTANEOUS | Status: DC

## 2017-09-21 MED ORDER — METOPROLOL SUCCINATE ER 25 MG PO TB24
25.0000 mg | ORAL_TABLET | Freq: Every day | ORAL | Status: DC
  Administered 2017-09-21 – 2017-09-23 (×3): 25 mg via ORAL
  Filled 2017-09-21 (×3): qty 1

## 2017-09-21 MED ORDER — POTASSIUM CHLORIDE CRYS ER 20 MEQ PO TBCR
40.0000 meq | EXTENDED_RELEASE_TABLET | Freq: Once | ORAL | Status: AC
  Administered 2017-09-21: 40 meq via ORAL
  Filled 2017-09-21: qty 2

## 2017-09-21 MED ORDER — INSULIN ASPART 100 UNIT/ML ~~LOC~~ SOLN
6.0000 [IU] | Freq: Three times a day (TID) | SUBCUTANEOUS | Status: DC
  Administered 2017-09-22 (×2): 6 [IU] via SUBCUTANEOUS

## 2017-09-21 NOTE — Progress Notes (Signed)
[place] TEAM 1 - Stepdown/ICU TEAM  [name] [name]  YTK:[id number] DOB: [birth year] DOA: 09/19/2017 PCP: Jinny Sanders, MD    Brief Narrative:  56 y.o. female with a history of GERD, HTN, HLD, hypothyroidism, gastroparesis, and DM with multiple admissions for DKA (most recently 10/9 > 10/10) who presented with nausea and vomiting and lactic acidosis. Patient has a history of medicine non compliance.   Subjective: The pt is resting comfortably.  She denies cp, sob, n/v, or abdom pain.  She does however report a poor appetite w/ limited intake.    Assessment & Plan:  DKA - uncontrolled DM Due to noncompliance with insulin - has been weaned off the insulin gtt - CBG now very well controlled - decrease coverage while intake poor    Possible atypical PNA CT chest noted diffuse ground-glass airspace opacities and interstitial thickening throughout the lungs, left greater than right - procalcitonin is reassuring at <0.10 on serial studies - clinically appears most c/w edema likely due to tachycardia in the setting of DKA - stop abx and follow clinically   Lactic acidosis  Due to above - resolved w/ volume   Hypokalemia  Due to treatment of DKA - cont to supplement   Diabetic gastroparesis  primary treatment will be control of CBG  Elevated Troponin - NO hx of CAD Most c/w demand ischemia -  recheck troponin confirms is correcting - no symptoms to suggest USAP presently - continue ASA, lipitor and Zetia - normal coronaries per cardiac cath March 2018  Hypothyroidism Continue home Synthroid  HTN Continue home anti-hypertensive medications - blood pressure presently controlled   Depression Continue home Zoloft   DVT prophylaxis: lovenox  Code Status: FULL CODE Family Communication: no family present at time of exam  Disposition Plan: encourage diet - ambulate - watch CBGs - possible d/c in 24 hrs  Consultants:  none  Procedures: none  Antimicrobials:  Zosyn 10/11 >  10/13 Vancomycin 10/11 > 10/12  Objective: Blood pressure 112/82, pulse 97, temperature 97.6 F (36.4 C), temperature source Oral, resp. rate 19, height 4\' 11"  (1.499 m), weight 59.9 kg (132 lb), last menstrual period 08/11/2012, SpO2 94 %.  Intake/Output Summary (Last 24 hours) at 09/21/17 1507 Last data filed at 09/21/17 0339  Gross per 24 hour  Intake           809.65 ml  Output             1050 ml  Net          -240.35 ml   Filed Weights   09/19/17 0442 09/20/17 0422 09/21/17 0400  Weight: 57.6 kg (127 lb) 59.4 kg (131 lb) 59.9 kg (132 lb)    Examination: General: No acute distress Lungs: CTA B w/o wheezing or crackles  Cardiovascular: RRR w/o M  Abdomen: NT/ND, soft, bs+, no mass  Extremities: No edema bilateral lower extremities  CBC:  Recent Labs Lab 09/17/17 1937 09/18/17 0533 09/19/17 0454 09/20/17 0410 09/21/17 0324  WBC 16.4* 12.7* 22.9* 27.5* 18.9*  NEUTROABS  --  8.0*  --   --   --   HGB 15.0 12.4 15.9* 15.0 13.6  HCT 42.2 36.2 44.7 44.0 39.9  MCV 83.1 83.0 83.1 84.0 83.5  PLT 369 278 382 328 557   Basic Metabolic Panel:  Recent Labs Lab 09/19/17 1702 09/20/17 0118 09/20/17 0410 09/20/17 1630 09/21/17 0324  NA 139 135 137 134* 133*  K 3.2* 3.0* 3.2* 3.3* 3.2*  CL 109 105  105 106 105  CO2 21* 18* 19* 18* 19*  GLUCOSE 140* 160* 173* 131* 105*  BUN 11 13 14 13 10   CREATININE 0.55 0.63 0.70 0.59 0.51  CALCIUM 8.0* 7.9* 8.3* 8.1* 7.7*   GFR: Estimated Creatinine Clearance: 63.3 mL/min (by C-G formula based on SCr of 0.51 mg/dL).  Liver Function Tests:  Recent Labs Lab 09/18/17 0058 09/20/17 0410 09/21/17 0324  AST 17 17 14*  ALT 12* 13* 11*  ALKPHOS 57 61 47  BILITOT 0.5 1.1 1.1  PROT 5.9* 5.8* 4.8*  ALBUMIN 3.4* 3.1* 2.5*    Recent Labs Lab 09/18/17 0058  LIPASE 20   Coagulation Profile:  Recent Labs Lab 09/20/17 0410  INR 1.21    Cardiac Enzymes:  Recent Labs Lab 09/19/17 0454 09/19/17 0752 09/19/17 1820  09/20/17 1630 09/21/17 0324  TROPONINI 0.28* 0.49* 0.68* 0.12* 0.10*    HbA1C: Hgb A1c MFr Bld  Date/Time Value Ref Range Status  09/18/2017 05:33 AM 10.2 (H) 4.8 - 5.6 % Final    Comment:    (NOTE) Pre diabetes:          5.7%-6.4% Diabetes:              >6.4% Glycemic control for   <7.0% adults with diabetes   04/05/2017 11:49 AM 10.8 (H) 4.8 - 5.6 % Final    Comment:    (NOTE)         Pre-diabetes: 5.7 - 6.4         Diabetes: >6.4         Glycemic control for adults with diabetes: <7.0     CBG:  Recent Labs Lab 09/20/17 1859 09/20/17 2000 09/20/17 2115 09/20/17 2351 09/21/17 0647  GLUCAP 122* 122* 135* 144* 78    Recent Results (from the past 240 hour(s))  MRSA PCR Screening     Status: None   Collection Time: 09/17/17 11:57 PM  Result Value Ref Range Status   MRSA by PCR NEGATIVE NEGATIVE Final    Comment:        The GeneXpert MRSA Assay (FDA approved for NASAL specimens only), is one component of a comprehensive MRSA colonization surveillance program. It is not intended to diagnose MRSA infection nor to guide or monitor treatment for MRSA infections.   Culture, blood (routine x 2)     Status: None (Preliminary result)   Collection Time: 09/19/17  7:00 AM  Result Value Ref Range Status   Specimen Description BLOOD RIGHT WRIST  Final   Special Requests   Final    BOTTLES DRAWN AEROBIC AND ANAEROBIC Blood Culture adequate volume   Culture NO GROWTH 1 DAY  Final   Report Status PENDING  Incomplete  Culture, blood (routine x 2)     Status: None (Preliminary result)   Collection Time: 09/19/17  7:41 AM  Result Value Ref Range Status   Specimen Description BLOOD LEFT HAND  Final   Special Requests   Final    BOTTLES DRAWN AEROBIC ONLY Blood Culture adequate volume   Culture NO GROWTH 1 DAY  Final   Report Status PENDING  Incomplete     Scheduled Meds: . aspirin  81 mg Oral Daily  . atorvastatin  80 mg Oral q1800  . enoxaparin (LOVENOX) injection   40 mg Subcutaneous Q24H  . ezetimibe  10 mg Oral Daily  . insulin aspart  0-5 Units Subcutaneous QHS  . insulin aspart  0-9 Units Subcutaneous TID WC  . insulin aspart  8  Units Subcutaneous TID WC  . levothyroxine  150 mcg Oral Daily  . loratadine  10 mg Oral Daily  . losartan  25 mg Oral Daily  . metoprolol tartrate  5 mg Intravenous Q6H  . sertraline  50 mg Oral Daily     LOS: 2 days   Cherene Altes, MD [facility] Office  [phone] Pager - Text Page per Amion as per below:  On-Call/Text Page:      [email or web address]      password [email or web address]  If 7PM-7AM, please contact night-coverage [email or web address] Password [email or web address] 09/21/2017, 3:07 PM

## 2017-09-22 LAB — BASIC METABOLIC PANEL
Anion gap: 8 (ref 5–15)
CO2: 24 mmol/L (ref 22–32)
CREATININE: 0.51 mg/dL (ref 0.44–1.00)
Calcium: 7.8 mg/dL — ABNORMAL LOW (ref 8.9–10.3)
Chloride: 105 mmol/L (ref 101–111)
GFR calc Af Amer: >60 mL/min (ref >60)
GLUCOSE: 82 mg/dL (ref 65–99)
POTASSIUM: 4 mmol/L (ref 3.5–5.1)
Sodium: 137 mmol/L (ref 135–145)

## 2017-09-22 LAB — GLUCOSE, CAPILLARY
GLUCOSE-CAPILLARY: 128 mg/dL — AB (ref 65–99)
Glucose-Capillary: 303 mg/dL — ABNORMAL HIGH (ref 65–99)
Glucose-Capillary: 61 mg/dL — ABNORMAL LOW (ref 65–99)
Glucose-Capillary: 70 mg/dL (ref 65–99)

## 2017-09-22 MED ORDER — INSULIN GLARGINE 100 UNIT/ML ~~LOC~~ SOLN
18.0000 [IU] | Freq: Every day | SUBCUTANEOUS | Status: DC
  Filled 2017-09-22 (×2): qty 0.18

## 2017-09-22 MED ORDER — DEXTROSE 50 % IV SOLN
INTRAVENOUS | Status: AC
  Administered 2017-09-22: 50 mL
  Filled 2017-09-22: qty 50

## 2017-09-22 MED ORDER — INSULIN ASPART 100 UNIT/ML ~~LOC~~ SOLN
4.0000 [IU] | Freq: Three times a day (TID) | SUBCUTANEOUS | Status: DC
  Administered 2017-09-22: 4 [IU] via SUBCUTANEOUS

## 2017-09-22 NOTE — Progress Notes (Signed)
[place] TEAM 1 - Stepdown/ICU TEAM  [name] [name]  QPY:[id number] DOB: 06/27/1961 DOA: 09/19/2017 PCP: Jinny Sanders, MD    Brief Narrative:  56 y.o. female with a history of GERD, HTN, HLD, hypothyroidism, gastroparesis, and DM with multiple admissions for DKA (most recently 10/9 > 10/10) who presented with nausea and vomiting and lactic acidosis. Patient has a history of medicine non compliance.   Subjective: Was reporting some atypical type chest pain earlier today.  EKG w/o acute changes.  Sx not c/w USAP.  Resolved spontaneously.  Presently denies any complaints.  No cp, sob, n/v, or abdom pain.  She tells me she has a modest appetite and has been eating "a fair amount."    Assessment & Plan:  DKA - uncontrolled DM Due to noncompliance with insulin at home - has been weaned off the insulin gtt - CBG now dipping below 70 on lower insulin dose than reported at home, likely due to insulin and diet noncompliance at home - decrease coverage today and follow - home when CBG more stable   Possible atypical PNA CT chest noted diffuse ground-glass airspace opacities and interstitial thickening throughout the lungs, left greater than right - procalcitonin is reassuring at <0.10 on serial studies - clinically appears most c/w edema likely due to tachycardia in the setting of DKA - stable clinically off abx   Lactic acidosis  Due to above - resolved w/ volume   Hypokalemia  Due to treatment of DKA - corrected w/ supplementation    Diabetic gastroparesis  primary treatment will be control of CBG  Elevated Troponin - NO hx of CAD Most c/w demand ischemia -  recheck troponin confirms is correcting - no symptoms to suggest USAP presently - continue ASA, lipitor and Zetia - normal coronaries per cardiac cath March 2018  Hypothyroidism Continue home Synthroid  HTN Continue home anti-hypertensive medications - BP controlled   Depression Continue home Zoloft   DVT prophylaxis:  lovenox  Code Status: FULL CODE Family Communication: spoke w/ husband at bedside   Disposition Plan: encourage diet - ambulate - watch CBGs - possible d/c in 24 hrs  Consultants:  none  Procedures: none  Antimicrobials:  Zosyn 10/11 > 10/13 Vancomycin 10/11 > 10/12  Objective: Blood pressure 121/88, pulse 96, temperature 98.7 F (37.1 C), temperature source Oral, resp. rate 18, height 4\' 11"  (1.499 m), weight 58.2 kg (128 lb 4.8 oz), last menstrual period 08/11/2012, SpO2 98 %.  Intake/Output Summary (Last 24 hours) at 09/22/17 1636 Last data filed at 09/22/17 1200  Gross per 24 hour  Intake              360 ml  Output                0 ml  Net              360 ml   Filed Weights   09/20/17 0422 09/21/17 0400 09/22/17 0511  Weight: 59.4 kg (131 lb) 59.9 kg (132 lb) 58.2 kg (128 lb 4.8 oz)    Examination: General: No acute distress - alert - affect flat  Lungs: CTA B  Cardiovascular: RRR - no M or rub  Abdomen: NT/ND, soft, bs+, no mass  Extremities: No C/C/E B LE   CBC:  Recent Labs Lab 09/17/17 1937 09/18/17 0533 09/19/17 0454 09/20/17 0410 09/21/17 0324  WBC 16.4* 12.7* 22.9* 27.5* 18.9*  NEUTROABS  --  8.0*  --   --   --  HGB 15.0 12.4 15.9* 15.0 13.6  HCT 42.2 36.2 44.7 44.0 39.9  MCV 83.1 83.0 83.1 84.0 83.5  PLT 369 278 382 328 132   Basic Metabolic Panel:  Recent Labs Lab 09/20/17 0118 09/20/17 0410 09/20/17 1630 09/21/17 0324 09/22/17 0225  NA 135 137 134* 133* 137  K 3.0* 3.2* 3.3* 3.2* 4.0  CL 105 105 106 105 105  CO2 18* 19* 18* 19* 24  GLUCOSE 160* 173* 131* 105* 82  BUN 13 14 13 10  <5*  CREATININE 0.63 0.70 0.59 0.51 0.51  CALCIUM 7.9* 8.3* 8.1* 7.7* 7.8*   GFR: Estimated Creatinine Clearance: 62.4 mL/min (by C-G formula based on SCr of 0.51 mg/dL).  Liver Function Tests:  Recent Labs Lab 09/18/17 0058 09/20/17 0410 09/21/17 0324  AST 17 17 14*  ALT 12* 13* 11*  ALKPHOS 57 61 47  BILITOT 0.5 1.1 1.1  PROT 5.9* 5.8*  4.8*  ALBUMIN 3.4* 3.1* 2.5*    Recent Labs Lab 09/18/17 0058  LIPASE 20   Coagulation Profile:  Recent Labs Lab 09/20/17 0410  INR 1.21    Cardiac Enzymes:  Recent Labs Lab 09/19/17 0454 09/19/17 0752 09/19/17 1820 09/20/17 1630 09/21/17 0324  TROPONINI 0.28* 0.49* 0.68* 0.12* 0.10*    HbA1C: Hgb A1c MFr Bld  Date/Time Value Ref Range Status  09/18/2017 05:33 AM 10.2 (H) 4.8 - 5.6 % Final    Comment:    (NOTE) Pre diabetes:          5.7%-6.4% Diabetes:              >6.4% Glycemic control for   <7.0% adults with diabetes   04/05/2017 11:49 AM 10.8 (H) 4.8 - 5.6 % Final    Comment:    (NOTE)         Pre-diabetes: 5.7 - 6.4         Diabetes: >6.4         Glycemic control for adults with diabetes: <7.0     CBG:  Recent Labs Lab 09/21/17 1608 09/21/17 1618 09/21/17 1630 09/21/17 2118 09/22/17 1357  GLUCAP 75 82 112* 84 61*    Recent Results (from the past 240 hour(s))  MRSA PCR Screening     Status: None   Collection Time: 09/17/17 11:57 PM  Result Value Ref Range Status   MRSA by PCR NEGATIVE NEGATIVE Final    Comment:        The GeneXpert MRSA Assay (FDA approved for NASAL specimens only), is one component of a comprehensive MRSA colonization surveillance program. It is not intended to diagnose MRSA infection nor to guide or monitor treatment for MRSA infections.   Culture, blood (routine x 2)     Status: None (Preliminary result)   Collection Time: 09/19/17  7:00 AM  Result Value Ref Range Status   Specimen Description BLOOD RIGHT WRIST  Final   Special Requests   Final    BOTTLES DRAWN AEROBIC AND ANAEROBIC Blood Culture adequate volume   Culture NO GROWTH 3 DAYS  Final   Report Status PENDING  Incomplete  Culture, blood (routine x 2)     Status: None (Preliminary result)   Collection Time: 09/19/17  7:41 AM  Result Value Ref Range Status   Specimen Description BLOOD LEFT HAND  Final   Special Requests   Final    BOTTLES DRAWN  AEROBIC ONLY Blood Culture adequate volume   Culture NO GROWTH 3 DAYS  Final   Report Status PENDING  Incomplete  Scheduled Meds: . aspirin  81 mg Oral Daily  . atorvastatin  80 mg Oral q1800  . enoxaparin (LOVENOX) injection  40 mg Subcutaneous Q24H  . ezetimibe  10 mg Oral Daily  . insulin aspart  0-5 Units Subcutaneous QHS  . insulin aspart  0-9 Units Subcutaneous TID WC  . insulin aspart  4 Units Subcutaneous TID WC  . insulin glargine  18 Units Subcutaneous QHS  . levothyroxine  150 mcg Oral Daily  . loratadine  10 mg Oral Daily  . losartan  25 mg Oral Daily  . metoprolol succinate  25 mg Oral Daily  . sertraline  50 mg Oral Daily     LOS: 3 days   Cherene Altes, MD [facility] Office  [phone] Pager - Text Page per Amion as per below:  On-Call/Text Page:      [email or web address]      password [email or web address]  If 7PM-7AM, please contact night-coverage [email or web address] Password [email or web address] 09/22/2017, 4:36 PM

## 2017-09-23 DIAGNOSIS — E039 Hypothyroidism, unspecified: Secondary | ICD-10-CM

## 2017-09-23 LAB — GLUCOSE, CAPILLARY: GLUCOSE-CAPILLARY: 166 mg/dL — AB (ref 65–99)

## 2017-09-23 MED ORDER — INSULIN LISPRO 100 UNIT/ML (KWIKPEN): PEN_INJECTOR | SUBCUTANEOUS | 0 refills | Status: DC

## 2017-09-23 MED ORDER — BASAGLAR KWIKPEN 100 UNIT/ML ~~LOC~~ SOPN: 18.0000 [IU] | PEN_INJECTOR | Freq: Every day | SUBCUTANEOUS | 1 refills | Status: DC

## 2017-09-23 NOTE — Discharge Instructions (Signed)
Diabetic Ketoacidosis °Diabetic ketoacidosis is a life-threatening complication of diabetes. If it is not treated, it can cause severe dehydration and organ damage and can lead to a coma or death. °What are the causes? °This condition develops when there is not enough of the hormone insulin in the body. Insulin helps the body to break down sugar for energy. Without insulin, the body cannot break down sugar, so it breaks down fats instead. This leads to the production of acids that are called ketones. Ketones are poisonous at high levels. °This condition can be triggered by: °· Stress on the body that is brought on by an illness. °· Medicines that raise blood glucose levels. °· Not taking diabetes medicine. ° °What are the signs or symptoms? °Symptoms of this condition include: °· Fatigue. °· Weight loss. °· Excessive thirst. °· Light-headedness. °· Fruity or sweet-smelling breath. °· Excessive urination. °· Vision changes. °· Confusion or irritability. °· Nausea. °· Vomiting. °· Rapid breathing. °· Abdominal pain. °· Feeling flushed. ° °How is this diagnosed? °This condition is diagnosed based on a medical history, a physical exam, and blood tests. You may also have a urine test that checks for ketones. °How is this treated? °This condition may be treated with: °· Fluid replacement. This may be done to correct dehydration. °· Insulin injections. These may be given through the skin or through an IV tube. °· Electrolyte replacement. Electrolytes, such as potassium and sodium, may be given in pill form or through an IV tube. °· Antibiotic medicines. These may be prescribed if your condition was caused by an infection. ° °Follow these instructions at home: °Eating and drinking °· Drink enough fluids to keep your urine clear or pale yellow. °· If you cannot eat, alternate between drinking fluids with sugar (such as juice) and salty fluids (such as broth or bouillon). °· If you can eat, follow your usual diet and drink  sugar-free liquids, such as water. °Other Instructions ° °· Take insulin as directed by your health care provider. Do not skip insulin injections. Do not use expired insulin. °· If your blood sugar is over 240 mg/dL, monitor your urine ketones every 4-6 hours. °· If you were prescribed an antibiotic medicine, finish all of it even if you start to feel better. °· Rest and exercise only as directed by your health care provider. °· If you get sick, call your health care provider and begin treatment quickly. Your body often needs extra insulin to fight an illness. °· Check your blood glucose levels regularly. If your blood glucose is high, drink plenty of fluids. This helps to flush out ketones. °Contact a health care provider if: °· Your blood glucose level is too high or too low. °· You have ketones in your urine. °· You have a fever. °· You cannot eat. °· You cannot tolerate fluids. °· You have been vomiting for more than 2 hours. °· You continue to have symptoms of this condition. °· You develop new symptoms. °Get help right away if: °· Your blood glucose levels continue to be high (elevated). °· Your monitor reads “high” even when you are taking insulin. °· You faint. °· You have chest pain. °· You have trouble breathing. °· You have a sudden, severe headache. °· You have sudden weakness in one arm or one leg. °· You have sudden trouble speaking or swallowing. °· You have vomiting or diarrhea that gets worse after 3 hours. °· You feel severely fatigued. °· You have trouble thinking. °· You   have abdominal pain.  You are severely dehydrated. Symptoms of severe dehydration include: ? Extreme thirst. ? Dry mouth. ? Blue lips. ? Cold hands and feet. ? Rapid breathing. This information is not intended to replace advice given to you by your health care provider. Make sure you discuss any questions you have with your health care provider. Document Released: 11/23/2000 Document Revised: 05/03/2016 Document  Reviewed: 11/03/2014 Elsevier Interactive Patient Education  2017 [employer]  Diabetes Mellitus and Food It is important for you to manage your blood sugar (glucose) level. Your blood glucose level can be greatly affected by what you eat. Eating healthier foods in the appropriate amounts throughout the day at about the same time each day will help you control your blood glucose level. It can also help slow or prevent worsening of your diabetes mellitus. Healthy eating may even help you improve the level of your blood pressure and reach or maintain a healthy weight. General recommendations for healthful eating and cooking habits include:  Eating meals and snacks regularly. Avoid going long periods of time without eating to lose weight.  Eating a diet that consists mainly of plant-based foods, such as fruits, vegetables, nuts, legumes, and whole grains.  Using low-heat cooking methods, such as baking, instead of high-heat cooking methods, such as deep frying.  Work with your dietitian to make sure you understand how to use the Nutrition Facts information on food labels. How can food affect me? Carbohydrates Carbohydrates affect your blood glucose level more than any other type of food. Your dietitian will help you determine how many carbohydrates to eat at each meal and teach you how to count carbohydrates. Counting carbohydrates is important to keep your blood glucose at a healthy level, especially if you are using insulin or taking certain medicines for diabetes mellitus. Alcohol Alcohol can cause sudden decreases in blood glucose (hypoglycemia), especially if you use insulin or take certain medicines for diabetes mellitus. Hypoglycemia can be a life-threatening condition. Symptoms of hypoglycemia (sleepiness, dizziness, and disorientation) are similar to symptoms of having too much alcohol. If your health care provider has given you approval to drink alcohol, do so in moderation and use the  following guidelines:  Women should not have more than one drink per day, and men should not have more than two drinks per day. One drink is equal to: ? 12 oz of beer. ? 5 oz of wine. ? 1 oz of hard liquor.  Do not drink on an empty stomach.  Keep yourself hydrated. Have water, diet soda, or unsweetened iced tea.  Regular soda, juice, and other mixers might contain a lot of carbohydrates and should be counted.  What foods are not recommended? As you make food choices, it is important to remember that all foods are not the same. Some foods have fewer nutrients per serving than other foods, even though they might have the same number of calories or carbohydrates. It is difficult to get your body what it needs when you eat foods with fewer nutrients. Examples of foods that you should avoid that are high in calories and carbohydrates but low in nutrients include:  Trans fats (most processed foods list trans fats on the Nutrition Facts label).  Regular soda.  Juice.  Candy.  Sweets, such as cake, pie, doughnuts, and cookies.  Fried foods.  What foods can I eat? Eat nutrient-rich foods, which will nourish your body and keep you healthy. The food you should eat also will depend on several factors,  including:  The calories you need.  The medicines you take.  Your weight.  Your blood glucose level.  Your blood pressure level.  Your cholesterol level.  You should eat a variety of foods, including:  Protein. ? Lean cuts of meat. ? Proteins low in saturated fats, such as fish, egg whites, and beans. Avoid processed meats.  Fruits and vegetables. ? Fruits and vegetables that may help control blood glucose levels, such as apples, mangoes, and yams.  Dairy products. ? Choose fat-free or low-fat dairy products, such as milk, yogurt, and cheese.  Grains, bread, pasta, and rice. ? Choose whole grain products, such as multigrain bread, whole oats, and brown rice. These foods may  help control blood pressure.  Fats. ? Foods containing healthful fats, such as nuts, avocado, olive oil, canola oil, and fish.  Does everyone with diabetes mellitus have the same meal plan? Because every person with diabetes mellitus is different, there is not one meal plan that works for everyone. It is very important that you meet with a dietitian who will help you create a meal plan that is just right for you. This information is not intended to replace advice given to you by your health care provider. Make sure you discuss any questions you have with your health care provider. Document Released: 08/23/2005 Document Revised: 05/03/2016 Document Reviewed: 10/23/2013 Elsevier Interactive Patient Education  2017 [employer].

## 2017-09-23 NOTE — Progress Notes (Signed)
Inpatient Diabetes Program Recommendations  AACE/ADA: New Consensus Statement on Inpatient Glycemic Control (2015)  Target Ranges:  Prepandial:   less than 140 mg/dL      Peak postprandial:   less than 180 mg/dL (1-2 hours)      Critically ill patients:  140 - 180 mg/dL   Lab Results  Component Value Date   GLUCAP 166 (H) 09/23/2017   HGBA1C 10.2 (H) 09/18/2017    Review of Glycemic Control Results for [name] [name], [name] [name] (MRN [id number]) as of 09/23/2017 11:41  Ref. Range 09/22/2017 13:57 09/22/2017 14:18 09/22/2017 21:05 09/22/2017 22:26 09/23/2017 11:30  Glucose-Capillary Latest Ref Range: 65 - 99 mg/dL 61 (L) 303 (H) 70 128 (H) 166 (H)   Inpatient Diabetes Program Recommendations:    Please consider: -Decrease Novolog meal coverage to 2 units tid if eats 50%  Thank you, Nani Gasser. Atziri Zubiate, RN, MSN, CDE  Diabetes Coordinator Inpatient Glycemic Control Team Team Pager [phone] (8am-5pm) 09/23/2017 11:42 AM

## 2017-09-23 NOTE — Progress Notes (Signed)
Discussed with the patient and all questioned fully answered. She will call me if any problems arise.  IV removed. Telemetry removed, CCMD notified. Pt given paper Rx in discharge paperwork. RN made pt a chart for her sliding scale - printed 5 copies. Pt given ExitCare instructions on a diabetic healthy diet.   Fritz Pickerel, RN

## 2017-09-23 NOTE — Care Management Note (Signed)
Case Management Note Marvetta Gibbons RN, BSN Unit 4E-Case Manager [phone]  Patient Details  Name: [name] [name] MRN: [id number] Date of Birth: 10-05-1961  Subjective/Objective:   Pt admitted with DKA, ?PNA                 Action/Plan: PTA pt lived at home with spouse-  Was just d/c'd on 10/10- and had f/u for 10/23 at 3:15 with Dr. Cruzita Lederer-  CM will follow for d/c needs  Expected Discharge Date:  09/23/17               Expected Discharge Plan:  Home/Self Care  In-House Referral:  NA  Discharge planning Services  CM Consult  Post Acute Care Choice:  NA Choice offered to:  NA  DME Arranged:    DME Agency:     HH Arranged:    [place]:     Status of Service:  Completed, signed off  If discussed at [employer] of Stay Meetings, dates discussed:    Discharge Disposition: home/self care   Additional Comments:  09/23/17- [place] RN, CM- pt for d/c home today- no CM needs noted for discharge.   Dawayne Patricia, RN 09/23/2017, 3:20 PM

## 2017-09-23 NOTE — Discharge Summary (Signed)
DISCHARGE SUMMARY  [name] [name]  MR#: [id number]  DOB:11-Nov-1961  Date of Admission: 09/19/2017 Date of Discharge: 09/23/2017  Attending Physician:Kamala Kolton T  Patient's UXN:ATFTDDU, Amy E, MD  Consults:  none  Disposition: D/C home   Follow-up Appts: Follow-up Information    Bedsole, Amy E, MD Follow up in 5 day(s).   Specialty:  Family Medicine Contact information: [place] [address] [phone]          Tests Needing Follow-up: -assess CBG control - further titration of insulin likely to be required   Discharge Diagnoses: DKA - uncontrolled DM Possible atypical PNA Lactic acidosis  Hypokalemia  Diabetic gastroparesis  Elevated Troponin - NO hx of CAD Hypothyroidism HTN Depression  Initial presentation: 56 y.o.femalewith a history of GERD, HTN, HLD, hypothyroidism, gastroparesis, and DM with multiple admissions for DKA (most recently 10/9 > 10/10) who presented with nausea and vomiting and lactic acidosis. Patient has a history of medicine non compliance.   Hospital Course:  DKA - uncontrolled DM Due to noncompliance with insulin at home - weaned off the insulin gtt quickly - CBG intermittently dipped below 70 on lower insulin dose than reported at home, likely due to insulin and diet noncompliance at home - CBG stabilized at time of d/c w/o severe hypoglycemia - to utilize home SSI after d/c until follows up w/ her Endocrinologist   Possible atypical PNA CT chest noted diffuse ground-glass airspace opacities and interstitial thickening throughout the lungs, left greater than right - procalcitonin was reassuring at <0.10 on serial studies - clinically appeared most c/w edema likely due to tachycardia in the setting of DKA - stable clinically off abx   Lactic acidosis  Due to above - resolved w/ volume   Hypokalemia  Due to treatment of DKA - corrected w/ supplementation    Diabetic gastroparesis  primary  treatment will be control of CBG  Elevated Troponin - NO hx of CAD Most c/w demand ischemia - no symptoms to suggest USAP - continue ASA, lipitor and Zetia - normal coronaries per cardiac cath March 2018  Hypothyroidism Continue home Synthroid  HTN Continue home anti-hypertensive medications - BP controlled   Depression Continue home Zoloft   Allergies as of 09/23/2017      Reactions   Codeine Hives, Anxiety, Other (See Comments)      Medication List    TAKE these medications   aspirin 81 MG chewable tablet Chew 1 tablet (81 mg total) by mouth daily.   atorvastatin 80 MG tablet Commonly known as:  LIPITOR Take 1 tablet (80 mg total) by mouth daily at 6 PM.   BASAGLAR KWIKPEN 100 UNIT/ML Sopn Inject 0.18 mLs (18 Units total) into the skin at bedtime. What changed:  how much to take   cetirizine 10 MG tablet Commonly known as:  ZYRTEC Take 1 tablet (10 mg total) by mouth daily.   ezetimibe 10 MG tablet Commonly known as:  ZETIA Take 1 tablet (10 mg total) by mouth daily.   FREESTYLE LIBRE READER Devi 1 Device by Does not apply route 3 (three) times daily.   FREESTYLE LIBRE SENSOR SYSTEM Misc 1 Device by Does not apply route every 30 (thirty) days.   glucose blood test strip Commonly known as:  ONETOUCH VERIO Use to check blood sugar daily as needed.  Dx: E11.10   insulin lispro 100 UNIT/ML KiwkPen Commonly known as:  HUMALOG KWIKPEN Inject into skin 3 times a day at meal time according to the  following sliding scale: CBG 70-120 - 0 units / CBG 121-150 - 1 unit / CBG 151-200 - 2 units / CBG 201-250 - 3 units / CBG 251-300 - 5 units / CBG 301-350 - 7 units / CBG 351-400+ - 9 units  YOU MUST FOLLOW A STRICT DIABETIC DIET What changed:  how much to take  how to take this  when to take this  additional instructions   levothyroxine 150 MCG tablet Commonly known as:  SYNTHROID, LEVOTHROID Take 1 tablet (150 mcg total) by mouth daily.   losartan 25 MG  tablet Commonly known as:  COZAAR Take 25 mg by mouth daily.   metoCLOPramide 5 MG tablet Commonly known as:  REGLAN TAKE 1 TABLET BY MOUTH 4 TIMES A DAY BEFORE MEALS AND AT BEDTIME   metoprolol succinate 25 MG 24 hr tablet Commonly known as:  TOPROL-XL Take 25 mg by mouth daily.   ondansetron 4 MG tablet Commonly known as:  ZOFRAN TAKE 1 TABLET BY MOUTH EVERY 6 HOURS AS NEEDED FOR NAUSEA AND VOMITING   RELPAX 40 MG tablet Generic drug:  eletriptan ONE TABLET BY MOUTH AT ONSET OF HEADACHE. MAY REPEAT IN 2 HOURS IF HEADACHE PERSISTS OR RECURS.   sertraline 50 MG tablet Commonly known as:  ZOLOFT Take 1 tablet (50 mg total) by mouth daily.       Day of Discharge BP 109/85 (BP Location: Right Arm)   Pulse 98   Temp 99.5 F (37.5 C) (Oral)   Resp 17   Ht 4\' 11"  (1.499 m)   Wt 57.1 kg (125 lb 14.4 oz)   LMP 08/11/2012   SpO2 95%   BMI 25.43 kg/m   Physical Exam: General: No acute respiratory distress Lungs: Clear to auscultation bilaterally without wheezes or crackles Cardiovascular: Regular rate and rhythm without murmur gallop or rub normal S1 and S2 Abdomen: Nontender, nondistended, soft, bowel sounds positive, no rebound, no ascites, no appreciable mass Extremities: No significant cyanosis, clubbing, or edema bilateral lower extremities  Basic Metabolic Panel:  Recent Labs Lab 09/20/17 0118 09/20/17 0410 09/20/17 1630 09/21/17 0324 09/22/17 0225  NA 135 137 134* 133* 137  K 3.0* 3.2* 3.3* 3.2* 4.0  CL 105 105 106 105 105  CO2 18* 19* 18* 19* 24  GLUCOSE 160* 173* 131* 105* 82  BUN 13 14 13 10  <5*  CREATININE 0.63 0.70 0.59 0.51 0.51  CALCIUM 7.9* 8.3* 8.1* 7.7* 7.8*    Liver Function Tests:  Recent Labs Lab 09/18/17 0058 09/20/17 0410 09/21/17 0324  AST 17 17 14*  ALT 12* 13* 11*  ALKPHOS 57 61 47  BILITOT 0.5 1.1 1.1  PROT 5.9* 5.8* 4.8*  ALBUMIN 3.4* 3.1* 2.5*    Recent Labs Lab 09/18/17 0058  LIPASE 20    Coags:  Recent  Labs Lab 09/20/17 0410  INR 1.21    CBC:  Recent Labs Lab 09/17/17 1937 09/18/17 0533 09/19/17 0454 09/20/17 0410 09/21/17 0324  WBC 16.4* 12.7* 22.9* 27.5* 18.9*  NEUTROABS  --  8.0*  --   --   --   HGB 15.0 12.4 15.9* 15.0 13.6  HCT 42.2 36.2 44.7 44.0 39.9  MCV 83.1 83.0 83.1 84.0 83.5  PLT 369 278 382 328 290    Cardiac Enzymes:  Recent Labs Lab 09/19/17 0454 09/19/17 0752 09/19/17 1820 09/20/17 1630 09/21/17 0324  TROPONINI 0.28* 0.49* 0.68* 0.12* 0.10*    CBG:  Recent Labs Lab 09/22/17 1357 09/22/17 1418 09/22/17 2105 09/22/17 2226  09/23/17 1130  GLUCAP 61* 303* 70 128* 166*    Recent Results (from the past 240 hour(s))  MRSA PCR Screening     Status: None   Collection Time: 09/17/17 11:57 PM  Result Value Ref Range Status   MRSA by PCR NEGATIVE NEGATIVE Final    Comment:        The GeneXpert MRSA Assay (FDA approved for NASAL specimens only), is one component of a comprehensive MRSA colonization surveillance program. It is not intended to diagnose MRSA infection nor to guide or monitor treatment for MRSA infections.   Culture, blood (routine x 2)     Status: None (Preliminary result)   Collection Time: 09/19/17  7:00 AM  Result Value Ref Range Status   Specimen Description BLOOD RIGHT WRIST  Final   Special Requests   Final    BOTTLES DRAWN AEROBIC AND ANAEROBIC Blood Culture adequate volume   Culture NO GROWTH 4 DAYS  Final   Report Status PENDING  Incomplete  Culture, blood (routine x 2)     Status: None (Preliminary result)   Collection Time: 09/19/17  7:41 AM  Result Value Ref Range Status   Specimen Description BLOOD LEFT HAND  Final   Special Requests   Final    BOTTLES DRAWN AEROBIC ONLY Blood Culture adequate volume   Culture NO GROWTH 4 DAYS  Final   Report Status PENDING  Incomplete      Time spent in discharge (includes decision making & examination of pt): 35 minutes  09/23/2017, 2:51 PM   Cherene Altes,  MD [facility] Office  [phone] Pager [phone]  On-Call/Text Page:      [email or web address]      password [facility]

## 2017-09-23 NOTE — Progress Notes (Signed)
Pt ambulated 280 feet on room air. Pt tolerated well. No SOB or telemetry changes.   Fritz Pickerel, RN

## 2017-09-24 LAB — CULTURE, BLOOD (ROUTINE X 2)
CULTURE: NO GROWTH
Culture: NO GROWTH
Special Requests: ADEQUATE
Special Requests: ADEQUATE

## 2017-10-01 ENCOUNTER — Ambulatory Visit: Payer: 59 | Admitting: Internal Medicine

## 2017-10-01 ENCOUNTER — Encounter: Payer: Self-pay | Admitting: Internal Medicine

## 2017-10-01 DIAGNOSIS — Z0289 Encounter for other administrative examinations: Secondary | ICD-10-CM

## 2017-10-02 ENCOUNTER — Telehealth: Payer: Self-pay | Admitting: *Deleted

## 2017-10-02 NOTE — Telephone Encounter (Signed)
Patient dismissed from [facility] endocrinology by Philemon Kingdom MD,effective October 01, 2017. Dismissal letter sent out by certified/ register mail.  JJB

## 2017-10-19 ENCOUNTER — Other Ambulatory Visit: Payer: Self-pay | Admitting: Family Medicine

## 2017-11-04 NOTE — Telephone Encounter (Signed)
Certified dismissal letter returned as undeliverable, unclaimed, return to sender after three attempts by [employer] on November 04, 2017. Letter placed in another envelope and resent as 1st class mail which does not require a signature. [email or web address]

## 2017-11-29 ENCOUNTER — Other Ambulatory Visit: Payer: Self-pay | Admitting: Family Medicine

## 2017-11-29 ENCOUNTER — Ambulatory Visit: Payer: 59 | Admitting: Internal Medicine

## 2017-11-29 MED ORDER — METOCLOPRAMIDE HCL 5 MG PO TABS: ORAL_TABLET | ORAL | 0 refills | Status: DC

## 2017-11-29 NOTE — Telephone Encounter (Signed)
LOV 09/12/17 with Dr Diona Browner / Refill request for reglan /

## 2017-11-29 NOTE — Telephone Encounter (Signed)
Copied from [place]. Topic: Quick Communication - Rx Refill/Question >> Nov 29, 2017  8:05 AM Lennox Solders wrote: Has the patient contacted their pharmacy? yes  (Agent: If no, request that the patient contact the pharmacy for the refill.) pt needs refill on reglan 5 mg   Preferred Pharmacy (with phone number or street name): [facility] [address] [phone]   Agent: Please be advised that RX refills may take up to 3 business days. We ask that you follow-up with your pharmacy.

## 2017-12-11 ENCOUNTER — Inpatient Hospital Stay (HOSPITAL_COMMUNITY)
Admission: EM | Admit: 2017-12-11 | Discharge: 2017-12-14 | DRG: 638 | Disposition: A | Discharge status: Home / Self Care | Payer: 59 | Attending: Internal Medicine | Admitting: Internal Medicine

## 2017-12-11 ENCOUNTER — Other Ambulatory Visit: Payer: Self-pay

## 2017-12-11 ENCOUNTER — Encounter (HOSPITAL_COMMUNITY): Payer: Self-pay

## 2017-12-11 ENCOUNTER — Emergency Department (HOSPITAL_COMMUNITY): Payer: 59

## 2017-12-11 DIAGNOSIS — Z79899 Other long term (current) drug therapy: Secondary | ICD-10-CM

## 2017-12-11 DIAGNOSIS — E1122 Type 2 diabetes mellitus with diabetic chronic kidney disease: Secondary | ICD-10-CM | POA: Diagnosis present

## 2017-12-11 DIAGNOSIS — K589 Irritable bowel syndrome without diarrhea: Secondary | ICD-10-CM | POA: Diagnosis present

## 2017-12-11 DIAGNOSIS — Z9119 Patient's noncompliance with other medical treatment and regimen: Secondary | ICD-10-CM | POA: Diagnosis not present

## 2017-12-11 DIAGNOSIS — E538 Deficiency of other specified B group vitamins: Secondary | ICD-10-CM | POA: Diagnosis present

## 2017-12-11 DIAGNOSIS — R112 Nausea with vomiting, unspecified: Secondary | ICD-10-CM | POA: Diagnosis present

## 2017-12-11 DIAGNOSIS — E1110 Type 2 diabetes mellitus with ketoacidosis without coma: Secondary | ICD-10-CM | POA: Diagnosis not present

## 2017-12-11 DIAGNOSIS — Z90710 Acquired absence of both cervix and uterus: Secondary | ICD-10-CM

## 2017-12-11 DIAGNOSIS — Z7189 Other specified counseling: Secondary | ICD-10-CM | POA: Diagnosis not present

## 2017-12-11 DIAGNOSIS — F329 Major depressive disorder, single episode, unspecified: Secondary | ICD-10-CM | POA: Diagnosis present

## 2017-12-11 DIAGNOSIS — E039 Hypothyroidism, unspecified: Secondary | ICD-10-CM | POA: Diagnosis present

## 2017-12-11 DIAGNOSIS — E1143 Type 2 diabetes mellitus with diabetic autonomic (poly)neuropathy: Secondary | ICD-10-CM | POA: Diagnosis not present

## 2017-12-11 DIAGNOSIS — G43909 Migraine, unspecified, not intractable, without status migrainosus: Secondary | ICD-10-CM | POA: Diagnosis present

## 2017-12-11 DIAGNOSIS — E559 Vitamin D deficiency, unspecified: Secondary | ICD-10-CM | POA: Diagnosis present

## 2017-12-11 DIAGNOSIS — Z87442 Personal history of urinary calculi: Secondary | ICD-10-CM | POA: Diagnosis not present

## 2017-12-11 DIAGNOSIS — I5022 Chronic systolic (congestive) heart failure: Secondary | ICD-10-CM | POA: Diagnosis present

## 2017-12-11 DIAGNOSIS — I130 Hypertensive heart and chronic kidney disease with heart failure and stage 1 through stage 4 chronic kidney disease, or unspecified chronic kidney disease: Secondary | ICD-10-CM | POA: Diagnosis present

## 2017-12-11 DIAGNOSIS — I428 Other cardiomyopathies: Secondary | ICD-10-CM

## 2017-12-11 DIAGNOSIS — Z9114 Patient's other noncompliance with medication regimen: Secondary | ICD-10-CM

## 2017-12-11 DIAGNOSIS — E1142 Type 2 diabetes mellitus with diabetic polyneuropathy: Secondary | ICD-10-CM | POA: Diagnosis present

## 2017-12-11 DIAGNOSIS — N182 Chronic kidney disease, stage 2 (mild): Secondary | ICD-10-CM | POA: Diagnosis present

## 2017-12-11 DIAGNOSIS — K3184 Gastroparesis: Secondary | ICD-10-CM | POA: Diagnosis not present

## 2017-12-11 DIAGNOSIS — E785 Hyperlipidemia, unspecified: Secondary | ICD-10-CM | POA: Diagnosis present

## 2017-12-11 DIAGNOSIS — F419 Anxiety disorder, unspecified: Secondary | ICD-10-CM | POA: Diagnosis not present

## 2017-12-11 DIAGNOSIS — F411 Generalized anxiety disorder: Secondary | ICD-10-CM | POA: Diagnosis present

## 2017-12-11 DIAGNOSIS — K219 Gastro-esophageal reflux disease without esophagitis: Secondary | ICD-10-CM | POA: Diagnosis present

## 2017-12-11 DIAGNOSIS — E876 Hypokalemia: Secondary | ICD-10-CM | POA: Diagnosis not present

## 2017-12-11 DIAGNOSIS — Z7982 Long term (current) use of aspirin: Secondary | ICD-10-CM

## 2017-12-11 DIAGNOSIS — I10 Essential (primary) hypertension: Secondary | ICD-10-CM | POA: Diagnosis not present

## 2017-12-11 DIAGNOSIS — Z66 Do not resuscitate: Secondary | ICD-10-CM | POA: Diagnosis present

## 2017-12-11 DIAGNOSIS — Z515 Encounter for palliative care: Secondary | ICD-10-CM | POA: Diagnosis not present

## 2017-12-11 DIAGNOSIS — F320 Major depressive disorder, single episode, mild: Secondary | ICD-10-CM | POA: Diagnosis not present

## 2017-12-11 DIAGNOSIS — Z87891 Personal history of nicotine dependence: Secondary | ICD-10-CM

## 2017-12-11 DIAGNOSIS — M810 Age-related osteoporosis without current pathological fracture: Secondary | ICD-10-CM | POA: Diagnosis present

## 2017-12-11 DIAGNOSIS — E7849 Other hyperlipidemia: Secondary | ICD-10-CM | POA: Diagnosis not present

## 2017-12-11 DIAGNOSIS — E0842 Diabetes mellitus due to underlying condition with diabetic polyneuropathy: Secondary | ICD-10-CM | POA: Diagnosis not present

## 2017-12-11 DIAGNOSIS — G629 Polyneuropathy, unspecified: Secondary | ICD-10-CM

## 2017-12-11 DIAGNOSIS — Z794 Long term (current) use of insulin: Secondary | ICD-10-CM

## 2017-12-11 LAB — I-STAT VENOUS BLOOD GAS, ED
Acid-base deficit: 6 mmol/L — ABNORMAL HIGH (ref 0.0–2.0)
Bicarbonate: 15.9 mmol/L — ABNORMAL LOW (ref 20.0–28.0)
O2 Saturation: 95 %
PCO2 VEN: 24.9 mmHg — AB (ref 44.0–60.0)
PH VEN: 7.414 (ref 7.250–7.430)
TCO2: 17 mmol/L — AB (ref 22–32)
pO2, Ven: 74 mmHg — ABNORMAL HIGH (ref 32.0–45.0)

## 2017-12-11 LAB — RAPID URINE DRUG SCREEN, HOSP PERFORMED
Amphetamines: NOT DETECTED
BARBITURATES: NOT DETECTED
Benzodiazepines: NOT DETECTED
COCAINE: NOT DETECTED
Opiates: POSITIVE — AB
Tetrahydrocannabinol: POSITIVE — AB

## 2017-12-11 LAB — URINALYSIS, ROUTINE W REFLEX MICROSCOPIC
BILIRUBIN URINE: NEGATIVE
Glucose, UA: >=500 mg/dL — AB
KETONES UR: 40 mg/dL — AB
LEUKOCYTES UA: NEGATIVE
NITRITE: NEGATIVE
PH: 5.5 (ref 5.0–8.0)
PROTEIN: NEGATIVE mg/dL
Specific Gravity, Urine: 1.01 (ref 1.005–1.030)

## 2017-12-11 LAB — GLUCOSE, CAPILLARY
GLUCOSE-CAPILLARY: 157 mg/dL — AB (ref 65–99)
GLUCOSE-CAPILLARY: 125 mg/dL — AB (ref 65–99)
Glucose-Capillary: 152 mg/dL — ABNORMAL HIGH (ref 65–99)
Glucose-Capillary: 133 mg/dL — ABNORMAL HIGH (ref 65–99)
Glucose-Capillary: 132 mg/dL — ABNORMAL HIGH (ref 65–99)

## 2017-12-11 LAB — HEPATIC FUNCTION PANEL
ALK PHOS: 77 U/L (ref 38–126)
ALT: 15 U/L (ref 14–54)
AST: 22 U/L (ref 15–41)
Albumin: 4.1 g/dL (ref 3.5–5.0)
BILIRUBIN DIRECT: 0.1 mg/dL (ref 0.1–0.5)
BILIRUBIN INDIRECT: 0.8 mg/dL (ref 0.3–0.9)
BILIRUBIN TOTAL: 0.9 mg/dL (ref 0.3–1.2)
Total Protein: 7.4 g/dL (ref 6.5–8.1)

## 2017-12-11 LAB — HEMOGLOBIN A1C
HEMOGLOBIN A1C: 11.1 % — AB (ref 4.8–5.6)
Mean Plasma Glucose: 271.87 mg/dL

## 2017-12-11 LAB — CBC
HCT: 46.6 % — ABNORMAL HIGH (ref 36.0–46.0)
HEMOGLOBIN: 16.2 g/dL — AB (ref 12.0–15.0)
MCH: 28.9 pg (ref 26.0–34.0)
MCHC: 34.8 g/dL (ref 30.0–36.0)
MCV: 83.2 fL (ref 78.0–100.0)
PLATELETS: 348 10*3/uL (ref 150–400)
RBC: 5.6 MIL/uL — ABNORMAL HIGH (ref 3.87–5.11)
RDW: 14.4 % (ref 11.5–15.5)
WBC: 15.4 10*3/uL — AB (ref 4.0–10.5)

## 2017-12-11 LAB — BASIC METABOLIC PANEL
Anion gap: 19 — ABNORMAL HIGH (ref 5–15)
Anion gap: 15 (ref 5–15)
BUN: 13 mg/dL (ref 6–20)
BUN: 11 mg/dL (ref 6–20)
CALCIUM: 8.7 mg/dL — AB (ref 8.9–10.3)
CO2: 15 mmol/L — ABNORMAL LOW (ref 22–32)
CO2: 19 mmol/L — ABNORMAL LOW (ref 22–32)
CREATININE: 0.81 mg/dL (ref 0.44–1.00)
Calcium: 9.3 mg/dL (ref 8.9–10.3)
Chloride: 101 mmol/L (ref 101–111)
Chloride: 105 mmol/L (ref 101–111)
Creatinine, Ser: 0.74 mg/dL (ref 0.44–1.00)
GFR calc Af Amer: >60 mL/min (ref >60)
GFR calc Af Amer: >60 mL/min (ref >60)
Glucose, Bld: 461 mg/dL — ABNORMAL HIGH (ref 65–99)
Glucose, Bld: 286 mg/dL — ABNORMAL HIGH (ref 65–99)
POTASSIUM: 3.6 mmol/L (ref 3.5–5.1)
POTASSIUM: 4.2 mmol/L (ref 3.5–5.1)
SODIUM: 135 mmol/L (ref 135–145)
SODIUM: 139 mmol/L (ref 135–145)

## 2017-12-11 LAB — URINALYSIS, MICROSCOPIC (REFLEX): Bacteria, UA: NONE SEEN

## 2017-12-11 LAB — CBG MONITORING, ED
GLUCOSE-CAPILLARY: 363 mg/dL — AB (ref 65–99)
GLUCOSE-CAPILLARY: 217 mg/dL — AB (ref 65–99)
GLUCOSE-CAPILLARY: 243 mg/dL — AB (ref 65–99)
GLUCOSE-CAPILLARY: 212 mg/dL — AB (ref 65–99)
Glucose-Capillary: 419 mg/dL — ABNORMAL HIGH (ref 65–99)
Glucose-Capillary: 257 mg/dL — ABNORMAL HIGH (ref 65–99)

## 2017-12-11 LAB — T4, FREE: Free T4: 1.57 ng/dL — ABNORMAL HIGH (ref 0.61–1.12)

## 2017-12-11 LAB — I-STAT TROPONIN, ED: TROPONIN I, POC: 0.03 ng/mL (ref 0.00–0.08)

## 2017-12-11 LAB — MRSA PCR SCREENING: MRSA by PCR: NEGATIVE

## 2017-12-11 LAB — TSH: TSH: 0.016 u[IU]/mL — ABNORMAL LOW (ref 0.350–4.500)

## 2017-12-11 LAB — LIPASE, BLOOD: LIPASE: 41 U/L (ref 11–51)

## 2017-12-11 MED ORDER — ASPIRIN 81 MG PO CHEW
81.0000 mg | CHEWABLE_TABLET | Freq: Every day | ORAL | Status: DC
  Administered 2017-12-13 – 2017-12-14 (×2): 81 mg via ORAL
  Filled 2017-12-11 (×3): qty 1

## 2017-12-11 MED ORDER — EZETIMIBE 10 MG PO TABS
10.0000 mg | ORAL_TABLET | Freq: Every day | ORAL | Status: DC
Start: 2017-12-12 — End: 2017-12-14
  Administered 2017-12-13 – 2017-12-14 (×2): 10 mg via ORAL
  Filled 2017-12-11 (×3): qty 1

## 2017-12-11 MED ORDER — ACETAMINOPHEN 325 MG PO TABS
650.0000 mg | ORAL_TABLET | Freq: Four times a day (QID) | ORAL | Status: DC | PRN
  Filled 2017-12-11: qty 2

## 2017-12-11 MED ORDER — DEXTROSE 50 % IV SOLN: 25.0000 mL | INTRAVENOUS | Status: DC | PRN

## 2017-12-11 MED ORDER — ONDANSETRON HCL 4 MG PO TABS: 4.0000 mg | ORAL_TABLET | Freq: Four times a day (QID) | ORAL | Status: DC | PRN

## 2017-12-11 MED ORDER — POTASSIUM CHLORIDE 10 MEQ/100ML IV SOLN
10.0000 meq | INTRAVENOUS | Status: AC
  Administered 2017-12-11: 10 meq via INTRAVENOUS
  Filled 2017-12-11: qty 100

## 2017-12-11 MED ORDER — DEXTROSE-NACL 5-0.45 % IV SOLN
INTRAVENOUS | Status: DC
  Administered 2017-12-11 – 2017-12-13 (×2): via INTRAVENOUS

## 2017-12-11 MED ORDER — KETOROLAC TROMETHAMINE 15 MG/ML IJ SOLN
15.0000 mg | Freq: Four times a day (QID) | INTRAMUSCULAR | Status: DC | PRN
  Administered 2017-12-11: 15 mg via INTRAVENOUS
  Filled 2017-12-11: qty 1

## 2017-12-11 MED ORDER — SODIUM CHLORIDE 0.9 % IV SOLN
INTRAVENOUS | Status: DC
  Administered 2017-12-11: 3.6 [IU]/h via INTRAVENOUS
  Filled 2017-12-11: qty 1

## 2017-12-11 MED ORDER — LEVOTHYROXINE SODIUM 75 MCG PO TABS
150.0000 ug | ORAL_TABLET | Freq: Every day | ORAL | Status: DC
  Administered 2017-12-12 – 2017-12-14 (×3): 150 ug via ORAL
  Filled 2017-12-11 (×2): qty 2
  Filled 2017-12-11 (×2): qty 1
  Filled 2017-12-11 (×2): qty 2

## 2017-12-11 MED ORDER — METOPROLOL SUCCINATE ER 25 MG PO TB24
25.0000 mg | ORAL_TABLET | Freq: Every day | ORAL | Status: DC
  Administered 2017-12-13 – 2017-12-14 (×2): 25 mg via ORAL
  Filled 2017-12-11 (×4): qty 1

## 2017-12-11 MED ORDER — GI COCKTAIL ~~LOC~~
30.0000 mL | Freq: Once | ORAL | Status: AC
  Administered 2017-12-11: 30 mL via ORAL
  Filled 2017-12-11: qty 30

## 2017-12-11 MED ORDER — SODIUM CHLORIDE 0.9 % IV BOLUS (SEPSIS): 1000.0000 mL | Freq: Once | INTRAVENOUS | Status: DC

## 2017-12-11 MED ORDER — ACETAMINOPHEN 650 MG RE SUPP: 650.0000 mg | Freq: Four times a day (QID) | RECTAL | Status: DC | PRN

## 2017-12-11 MED ORDER — METOPROLOL SUCCINATE ER 25 MG PO TB24
25.0000 mg | ORAL_TABLET | Freq: Every day | ORAL | Status: DC
  Administered 2017-12-11: 25 mg via ORAL
  Filled 2017-12-11: qty 1

## 2017-12-11 MED ORDER — HYDRALAZINE HCL 20 MG/ML IJ SOLN: 5.0000 mg | Freq: Three times a day (TID) | INTRAMUSCULAR | Status: DC | PRN

## 2017-12-11 MED ORDER — SODIUM CHLORIDE 0.9 % IV SOLN: INTRAVENOUS | Status: DC

## 2017-12-11 MED ORDER — LOSARTAN POTASSIUM 50 MG PO TABS
25.0000 mg | ORAL_TABLET | Freq: Every day | ORAL | Status: DC
  Administered 2017-12-13 – 2017-12-14 (×2): 25 mg via ORAL
  Filled 2017-12-11 (×4): qty 1

## 2017-12-11 MED ORDER — ONDANSETRON HCL 4 MG/2ML IJ SOLN
4.0000 mg | Freq: Four times a day (QID) | INTRAMUSCULAR | Status: DC | PRN
  Administered 2017-12-11 – 2017-12-12 (×3): 4 mg via INTRAVENOUS
  Filled 2017-12-11 (×3): qty 2

## 2017-12-11 MED ORDER — ELETRIPTAN HYDROBROMIDE 40 MG PO TABS: 40.0000 mg | ORAL_TABLET | ORAL | Status: DC | PRN

## 2017-12-11 MED ORDER — BISACODYL 10 MG RE SUPP: 10.0000 mg | Freq: Every day | RECTAL | Status: DC | PRN

## 2017-12-11 MED ORDER — ONDANSETRON HCL 4 MG/2ML IJ SOLN
4.0000 mg | Freq: Once | INTRAMUSCULAR | Status: AC
  Administered 2017-12-11: 4 mg via INTRAVENOUS
  Filled 2017-12-11: qty 2

## 2017-12-11 MED ORDER — ENOXAPARIN SODIUM 40 MG/0.4ML ~~LOC~~ SOLN
40.0000 mg | SUBCUTANEOUS | Status: DC
  Administered 2017-12-11 – 2017-12-13 (×3): 40 mg via SUBCUTANEOUS
  Filled 2017-12-11 (×3): qty 0.4

## 2017-12-11 MED ORDER — SENNOSIDES-DOCUSATE SODIUM 8.6-50 MG PO TABS: 1.0000 | ORAL_TABLET | Freq: Every evening | ORAL | Status: DC | PRN

## 2017-12-11 MED ORDER — ATORVASTATIN CALCIUM 80 MG PO TABS
80.0000 mg | ORAL_TABLET | Freq: Every day | ORAL | Status: DC
  Administered 2017-12-13: 80 mg via ORAL
  Filled 2017-12-11 (×2): qty 1

## 2017-12-11 MED ORDER — PROMETHAZINE HCL 25 MG/ML IJ SOLN
12.5000 mg | Freq: Once | INTRAMUSCULAR | Status: AC
  Administered 2017-12-11: 12.5 mg via INTRAVENOUS
  Filled 2017-12-11: qty 1

## 2017-12-11 MED ORDER — MORPHINE SULFATE (PF) 4 MG/ML IV SOLN
4.0000 mg | Freq: Once | INTRAVENOUS | Status: AC
  Administered 2017-12-11: 4 mg via INTRAVENOUS
  Filled 2017-12-11: qty 1

## 2017-12-11 MED ORDER — INSULIN REGULAR BOLUS VIA INFUSION
0.0000 [IU] | Freq: Three times a day (TID) | INTRAVENOUS | Status: DC
  Filled 2017-12-11: qty 10

## 2017-12-11 MED ORDER — SODIUM CHLORIDE 0.9 % IV SOLN
INTRAVENOUS | Status: DC
  Administered 2017-12-11: 12:00:00 via INTRAVENOUS

## 2017-12-11 MED ORDER — SODIUM CHLORIDE 0.9 % IV BOLUS (SEPSIS)
1000.0000 mL | Freq: Once | INTRAVENOUS | Status: AC
  Administered 2017-12-11: 1000 mL via INTRAVENOUS

## 2017-12-11 MED ORDER — METOCLOPRAMIDE HCL 10 MG PO TABS: 5.0000 mg | ORAL_TABLET | ORAL | Status: DC | PRN

## 2017-12-11 MED ORDER — DEXTROSE-NACL 5-0.45 % IV SOLN: INTRAVENOUS | Status: DC

## 2017-12-11 MED ORDER — LORATADINE 10 MG PO TABS: 10.0000 mg | ORAL_TABLET | Freq: Every day | ORAL | Status: DC

## 2017-12-11 MED ORDER — SERTRALINE HCL 50 MG PO TABS
50.0000 mg | ORAL_TABLET | Freq: Every day | ORAL | Status: DC
  Administered 2017-12-13 – 2017-12-14 (×2): 50 mg via ORAL
  Filled 2017-12-11 (×4): qty 1

## 2017-12-11 MED ORDER — LOSARTAN POTASSIUM 25 MG PO TABS
25.0000 mg | ORAL_TABLET | Freq: Every day | ORAL | Status: DC
Start: 2017-12-11 — End: 2017-12-11
  Administered 2017-12-11: 25 mg via ORAL
  Filled 2017-12-11: qty 1

## 2017-12-11 NOTE — ED Notes (Signed)
IV team at bedside 

## 2017-12-11 NOTE — ED Provider Notes (Signed)
[facility] Provider Note   CSN: [id number] Arrival date & time: 12/11/17  4132     History   Chief Complaint Chief Complaint  Patient presents with  . Chest Pain    HPI [name] [name] is a 57 y.o. female.  HPI [name] [name] is a 57 y.o. female with history of diabetes, acid reflux, hypertension, chronic kidney disease, hypothyroidism, presents to emergency department complaining of chest pain, abdominal pain, nausea, vomiting.  Patient states that she has chronic chest pain.  States "it is an everyday thing for me."  She states in the last 2 days the pain has been getting worse.  This morning she woke up at 4 in the morning with nausea and vomiting.  She states the vomiting made her chest pain worse.  She states she is unable to keep anything down.  She is actively vomiting during my exam.  Patient states that she is vomiting clear liquid with some food content.  Denies any blood or emesis.  She states she had loose stools this morning as well.  Denies any urinary symptoms.  Denies any fever or chills.  She states she has not had her insulin in about a week.  She states that she has it, but "I am not good at taking it."  She reports similar history in the past with frequent admissions.  Past Medical History:  Diagnosis Date  . Anxiety   . B12 deficiency   . Chest pain 04/05/2017  . GERD (gastroesophageal reflux disease)   . Hyperlipidemia   . Hypertension    [name]  . Hypothyroidism   . IBS (irritable bowel syndrome)   . Intermittent vertigo   . 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 Leg cramping    "@ night" (09/18/2013)  . Migraine    "once q couple months" (09/18/2013)  . Osteoporosis   . Peripheral neuropathy   . Type II diabetes mellitus ([place])   . Umbilical hernia    unrepaired (09/18/2013)    Patient Active Problem List   Diagnosis Date Noted  . Chest discomfort 09/18/2017  . DKA, type 1 ([place])  09/17/2017  . Phrygian cap 06/20/2017  . Lower abdominal pain   . Nonischemic cardiomyopathy ([place]) 03/22/2017  . NSTEMI (non-ST elevated myocardial infarction) ([place]) 02/11/2017  . H. pylori infection 02/09/2017  . Gastroparesis due to DM ([place]) 01/22/2017  . Atrial fibrillation ([place]) 02/01/2015  . 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 Hepatic lesion 09/18/2013  . Vitamin D deficiency 05/20/2012  . Essential hypertension 02/01/2011  . Osteoporosis 02/01/2011  . B12 deficiency 04/04/2010  . Peripheral neuropathy ([place]) 05/10/2009  . GERD 04/08/2009  . Generalized anxiety disorder 06/22/2008  . Hypothyroidism, acquired 04/29/2008  . Hyperlipidemia 04/29/2008  . Irritable bowel syndrome 04/29/2008    Past Surgical History:  Procedure Laterality Date  . CESAREAN SECTION  1989  . LAPAROSCOPIC ASSISTED VAGINAL HYSTERECTOMY  09/23/2012   Procedure: LAPAROSCOPIC ASSISTED VAGINAL HYSTERECTOMY;  Surgeon: Linda Hedges, DO;  Location: [facility];  Service: Gynecology;  Laterality: N/A;  pull Dr Gregor Hams instrument  . LEFT HEART CATH AND CORONARY ANGIOGRAPHY N/A 02/11/2017   Procedure: Left Heart Cath and Coronary Angiography;  Surgeon: Lorretta Harp, MD;  Location: [facility];  Service: Cardiovascular;  Laterality: N/A;  . TUBAL LIGATION Bilateral 1992  . VAGINAL HYSTERECTOMY  2013    OB History    Gravida Para Term  Preterm AB Living   2 2           SAB TAB Ectopic Multiple Live Births                   Home Medications    Prior to Admission medications   Medication Sig Start Date End Date Taking? Authorizing Provider  aspirin 81 MG chewable tablet Chew 1 tablet (81 mg total) by mouth daily. 02/12/17   Jani Gravel, MD  atorvastatin (LIPITOR) 80 MG tablet Take 1 tablet (80 mg total) by mouth daily at 6 PM. 09/04/16   Bedsole, Amy E, MD  cetirizine (ZYRTEC) 10 MG tablet TAKE 1 TABLET BY MOUTH EVERY DAY 10/20/17   Bedsole, Amy E, MD  Continuous Blood Gluc  Receiver (FREESTYLE LIBRE READER) DEVI 1 Device by Does not apply route 3 (three) times daily. 04/03/17   Philemon Kingdom, MD  Continuous Blood Gluc Sensor (FREESTYLE LIBRE SENSOR SYSTEM) MISC 1 Device by Does not apply route every 30 (thirty) days. 04/03/17   Philemon Kingdom, MD  ezetimibe (ZETIA) 10 MG tablet Take 1 tablet (10 mg total) by mouth daily. 09/11/16   Bedsole, Amy E, MD  Insulin Glargine (BASAGLAR KWIKPEN) 100 UNIT/ML SOPN Inject 0.18 mLs (18 Units total) into the skin at bedtime. 09/23/17   Cherene Altes, MD  insulin lispro (HUMALOG KWIKPEN) 100 UNIT/ML KiwkPen Inject into skin 3 times a day at meal time according to the following sliding scale: CBG 70-120 - 0 units / CBG 121-150 - 1 unit / CBG 151-200 - 2 units / CBG 201-250 - 3 units / CBG 251-300 - 5 units / CBG 301-350 - 7 units / CBG 351-400+ - 9 units  YOU MUST FOLLOW A STRICT DIABETIC DIET 09/23/17   Cherene Altes, MD  levothyroxine (SYNTHROID, LEVOTHROID) 150 MCG tablet Take 1 tablet (150 mcg total) by mouth daily. 04/03/17   Philemon Kingdom, MD  losartan (COZAAR) 25 MG tablet Take 25 mg by mouth daily. 05/27/17   [provider]  metoCLOPramide (REGLAN) 5 MG tablet TAKE 1 TABLET BY MOUTH 4 TIMES A DAY BEFORE MEALS AND AT BEDTIME 11/29/17   Bedsole, Amy E, MD  metoprolol succinate (TOPROL-XL) 25 MG 24 hr tablet Take 25 mg by mouth daily. 03/24/17   [provider]  ondansetron (ZOFRAN) 4 MG tablet TAKE 1 TABLET BY MOUTH EVERY 6 HOURS AS NEEDED FOR NAUSEA AND VOMITING 09/18/17   [name], Vernelle Emerald, MD  ONETOUCH VERIO test strip USE TO CHECK BLOOD SUGAR DAILY AS NEEDED. DX: E11.10 10/20/17   Bedsole, Amy E, MD  RELPAX 40 MG tablet ONE TABLET BY MOUTH AT ONSET OF HEADACHE. MAY REPEAT IN 2 HOURS IF HEADACHE PERSISTS OR RECURS. 10/17/16   Bedsole, Amy E, MD  sertraline (ZOLOFT) 50 MG tablet Take 1 tablet (50 mg total) by mouth daily. 09/02/17   Jinny Sanders, MD    Family History Family History    Problem Relation Age of Onset  . Diabetes Other   . Heart disease Other   . Heart failure Mother   . Diabetes Maternal Grandmother   . Alzheimer's disease Maternal Grandmother   . Coronary artery disease Maternal Grandmother   . Heart attack Maternal Grandfather   . Heart failure Brother     Social History Social History   Tobacco Use  . Smoking status: Former Smoker    Years: 0.20    Types: Cigarettes    Last attempt to quit: 12/10/1990  Years since quitting: 27.0  . Smokeless tobacco: Never Used  Substance Use Topics  . Alcohol use: No    Alcohol/week: 0.0 oz  . Drug use: Yes    Frequency: 3.0 times per week    Types: Marijuana     Allergies   Codeine   Review of Systems Review of Systems  Constitutional: Negative for chills and fever.  Respiratory: Positive for chest tightness. Negative for cough and shortness of breath.   Cardiovascular: Positive for chest pain. Negative for palpitations and leg swelling.  Gastrointestinal: Positive for abdominal pain, diarrhea, nausea and vomiting.  Genitourinary: Negative for dysuria, flank pain and pelvic pain.  Musculoskeletal: Negative for arthralgias, myalgias, neck pain and neck stiffness.  Skin: Negative for rash.  Neurological: Positive for dizziness and weakness. Negative for headaches.  All other systems reviewed and are negative.    Physical Exam Updated Vital Signs Pulse 91   Temp 97.6 F (36.4 C) (Oral)   Resp 20   Ht 5' 0.5" (1.537 m)   Wt 58.1 kg (128 lb)   LMP 08/11/2012   SpO2 99%   BMI 24.59 kg/m   Physical Exam  Constitutional: She appears well-developed and well-nourished. No distress.  HENT:  Head: Normocephalic.  Eyes: Conjunctivae are normal.  Neck: Neck supple.  Cardiovascular: Normal rate, regular rhythm and normal heart sounds.  ttp over chest wall diffusely  Pulmonary/Chest: Effort normal and breath sounds normal. No respiratory distress. She has no wheezes. She has no rales.   Abdominal: Soft. Bowel sounds are normal. She exhibits no distension. There is tenderness. There is no rebound.  Right upper quadrant, epigastric, left upper quadrant tenderness.  Musculoskeletal: She exhibits no edema.  Neurological: She is alert.  Skin: Skin is warm and dry.  Psychiatric: She has a normal mood and affect. Her behavior is normal.  Nursing note and vitals reviewed.    ED Treatments / Results  Labs (all labs ordered are listed, but only abnormal results are displayed) Labs Reviewed  BASIC METABOLIC PANEL - Abnormal; Notable for the following components:      Result Value   CO2 15 (*)    Glucose, Bld 461 (*)    Anion gap 19 (*)    All other components within normal limits  CBC - Abnormal; Notable for the following components:   WBC 15.4 (*)    RBC 5.60 (*)    Hemoglobin 16.2 (*)    HCT 46.6 (*)    All other components within normal limits  URINALYSIS, ROUTINE W REFLEX MICROSCOPIC - Abnormal; Notable for the following components:   APPearance HAZY (*)    Glucose, UA >=500 (*)    Hgb urine dipstick TRACE (*)    Ketones, ur 40 (*)    All other components within normal limits  BASIC METABOLIC PANEL - Abnormal; Notable for the following components:   CO2 19 (*)    Glucose, Bld 286 (*)    Calcium 8.7 (*)    All other components within normal limits  RAPID URINE DRUG SCREEN, HOSP PERFORMED - Abnormal; Notable for the following components:   Opiates POSITIVE (*)    Tetrahydrocannabinol POSITIVE (*)    All other components within normal limits  HEMOGLOBIN A1C - Abnormal; Notable for the following components:   Hgb A1c MFr Bld 11.1 (*)    All other components within normal limits  URINALYSIS, MICROSCOPIC (REFLEX) - Abnormal; Notable for the following components:   Squamous Epithelial / LPF 0-5 (*)  All other components within normal limits  TSH - Abnormal; Notable for the following components:   TSH 0.016 (*)    All other components within normal limits  T4,  FREE - Abnormal; Notable for the following components:   Free T4 1.57 (*)    All other components within normal limits  GLUCOSE, CAPILLARY - Abnormal; Notable for the following components:   Glucose-Capillary 157 (*)    All other components within normal limits  I-STAT VENOUS BLOOD GAS, ED - Abnormal; Notable for the following components:   pCO2, Ven 24.9 (*)    pO2, Ven 74.0 (*)    Bicarbonate 15.9 (*)    TCO2 17 (*)    Acid-base deficit 6.0 (*)    All other components within normal limits  CBG MONITORING, ED - Abnormal; Notable for the following components:   Glucose-Capillary 419 (*)    All other components within normal limits  CBG MONITORING, ED - Abnormal; Notable for the following components:   Glucose-Capillary 363 (*)    All other components within normal limits  CBG MONITORING, ED - Abnormal; Notable for the following components:   Glucose-Capillary 257 (*)    All other components within normal limits  CBG MONITORING, ED - Abnormal; Notable for the following components:   Glucose-Capillary 217 (*)    All other components within normal limits  CBG MONITORING, ED - Abnormal; Notable for the following components:   Glucose-Capillary 243 (*)    All other components within normal limits  CBG MONITORING, ED - Abnormal; Notable for the following components:   Glucose-Capillary 212 (*)    All other components within normal limits  MRSA PCR SCREENING  HEPATIC FUNCTION PANEL  LIPASE, BLOOD  BASIC METABOLIC PANEL  BASIC METABOLIC PANEL  CBC  I-STAT TROPONIN, ED    EKG  EKG Interpretation  Date/Time:  Wednesday December 11 2017 11:06:33 EST Ventricular Rate:  88 PR Interval:    QRS Duration: 94 QT Interval:  405 QTC Calculation: 490 R Axis:   91 Text Interpretation:  Sinus rhythm Borderline right axis deviation Borderline prolonged QT interval Baseline wander in lead(s) V6 nonspecific t wave changes resolved Confirmed by Dorie Rank [phone]) on 12/11/2017 11:21:08 AM         Radiology Dg Chest 2 View  Result Date: 12/11/2017 CLINICAL DATA:  Chest pain and shortness of breath beginning yesterday. EXAM: CHEST  2 VIEW COMPARISON:  09/20/2017 FINDINGS: Heart size is normal. Mediastinal shadows are normal. The lungs are clear. No bronchial thickening. No infiltrate, mass, effusion or collapse. Pulmonary vascularity is normal. No bony abnormality. IMPRESSION: Normal chest Electronically Signed   By: Nelson Chimes M.D.   On: 12/11/2017 11:02    Procedures Procedures (including critical care time)  Medications Ordered in ED Medications  sodium chloride 0.9 % bolus 1,000 mL (not administered)     Initial Impression / Assessment and Plan / ED Course  I have reviewed the triage vital signs and the nursing notes.  Pertinent labs & imaging results that were available during my care of the patient were reviewed by me and considered in my medical decision making (see chart for details).  Clinical Course as of Dec 11 1098  Wed Dec 11, 2017  1058 EKG (unable to view in [place]) Sinus rhythm rate 99Borderline prolonged PR and QT intervalNormal axisNormal ST T wavesNo prior EKG for comparison  [JK]    Clinical Course User Index [JK] Dorie Rank, MD    Patient in emergency department  with chest wall pain, nausea, vomiting, glucose noted to be over 500.  History of frequent admissions for DKA.  She is noncompliant.  Patient is actively vomiting.  Will order some Zofran, morphine for pain, fluids.  Labs already drawn by RN, waiting on results.  EKG with no acute findings. She does have hx of non ischemic cardiomyopathy.   Labs with normal troponin, however patient does have elevated anion gap, blood sugar 522, bicarb of 15.  Concerning for DKA.  History of the same.  Patient started on glucose stabilizer, spoke with medicine, they will admit her.  Patient symptoms did improve with Zofran.  Chest x-ray is negative.  Vital signs are normal at this time.    Final Clinical  Impressions(s) / ED Diagnoses   Final diagnoses:  Diabetic ketoacidosis without coma associated with type 2 diabetes mellitus [facility])    ED Discharge Orders    None       Jeannett Senior, PA-C 12/11/17 1934    Dorie Rank, MD 12/12/17 2312

## 2017-12-11 NOTE — H&P (Signed)
History and Physical    [name] [name] PPJ:[id number] DOB: Nov 04, 1961 DOA: 12/11/2017   PCP: Jinny Sanders, MD   Patient coming from:  Home    Chief Complaint: intractable nausea and vomiting, abdominal pain   HPI: [name] [name] is a 57 y.o. female with medical history significant for GERD, hypertension, hyperlipidemia, hypothyroidism, and ICM with EF 55% in June 2018, diabetes with multiple admissions for DKA, most recently from 10/11 through 09/23/2017, for DKA and gastroparesis accompanied by nausea and vomiting, lactic acidosis, presenting with the same complaints today.  She has a history of medicine noncompliance.  She began having nausea, with multiple episodes of nonviolent, nonbloody vomiting, without diarrhea for the last 3 days; she also reports diffuse abdominal discomfort, at times radiating to the chest, similar to her prior admissions of note, these chest discomfort is almost identical to the prior one during the October admission.  Denies any fever, chills, night sweats, sick contacts, dysuria, diarrhea.  No confusion is reported.   She reports early deep seizure, and polyuria .She admits to not having taking her insulin as scheduled, stating that she has a hard time staying on track with last insulin about 1 week ago.   ED Course:  BP (!) 188/104   Pulse (!) 104   Temp 97.6 F (36.4 C) (Oral)   Resp 11   Ht 5' 0.5" (1.537 m)   Wt 58.1 kg (128 lb)   LMP 08/11/2012   SpO2 98%   BMI 24.59 kg/m    CBGs was noted to be in 522, anion gap 19, CO2 15 she was placed on insulin drip Also receiving generous IV fluids. Given IV Zofran and 324 mg of aspirin by EMS PTA Potassium 3.6, creatinine 0.74, calcium normal at 9.3, lipase 41, LFTs normal, troponin  0.03, EKG sinus rhythm without any ACS White count 15.4, this has been elevated chronically, but improved from prior admission, when on arrival was 20.  Hemoglobin 16.2, in the setting of dehydration, platelets 348 Urine  negative Chest x-ray NAD  Review of Systems:  As per HPI otherwise all other systems reviewed and are negative  Past Medical History:  Diagnosis Date  . Anxiety   . B12 deficiency   . Chest pain 04/05/2017  . GERD (gastroesophageal reflux disease)   . Hyperlipidemia   . Hypertension    [name]  . Hypothyroidism   . IBS (irritable bowel syndrome)   . Intermittent vertigo   . 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 Leg cramping    "@ night" (09/18/2013)  . Migraine    "once q couple months" (09/18/2013)  . Osteoporosis   . Peripheral neuropathy   . Type II diabetes mellitus ([place])   . Umbilical hernia    unrepaired (09/18/2013)    Past Surgical History:  Procedure Laterality Date  . CESAREAN SECTION  1989  . LAPAROSCOPIC ASSISTED VAGINAL HYSTERECTOMY  09/23/2012   Procedure: LAPAROSCOPIC ASSISTED VAGINAL HYSTERECTOMY;  Surgeon: Linda Hedges, DO;  Location: [facility];  Service: Gynecology;  Laterality: N/A;  pull Dr Gregor Hams instrument  . LEFT HEART CATH AND CORONARY ANGIOGRAPHY N/A 02/11/2017   Procedure: Left Heart Cath and Coronary Angiography;  Surgeon: Lorretta Harp, MD;  Location: [facility];  Service: Cardiovascular;  Laterality: N/A;  . TUBAL LIGATION Bilateral 1992  . VAGINAL HYSTERECTOMY  2013    Social History Social History   Socioeconomic History  . Marital status: Married  Spouse name: Not on file  . Number of children: 2  . Years of education: Not on file  . Highest education level: Not on file  Social Needs  . Financial resource strain: Not on file  . Food insecurity - worry: Not on file  . Food insecurity - inability: Not on file  . Transportation needs - medical: Not on file  . Transportation needs - non-medical: Not on file  Occupational History  . Occupation: UNEMPLOYED    Fish farm manager: UNEMPLOYED    Comment: homemaker  Tobacco Use  . Smoking status: Former Smoker    Years: 0.20    Types: Cigarettes    Last  attempt to quit: 12/10/1990    Years since quitting: 27.0  . Smokeless tobacco: Never Used  Substance and Sexual Activity  . Alcohol use: No    Alcohol/week: 0.0 oz  . Drug use: Yes    Frequency: 3.0 times per week    Types: Marijuana  . Sexual activity: Yes  Other Topics Concern  . Not on file  Social History Narrative   Regular exercise- no    Diet- lots of mountain dew, limits fast foods      Allergies  Allergen Reactions  . Codeine Hives, Anxiety and Other (See Comments)    Family History  Problem Relation Age of Onset  . Diabetes Other   . Heart disease Other   . Heart failure Mother   . Diabetes Maternal Grandmother   . Alzheimer's disease Maternal Grandmother   . Coronary artery disease Maternal Grandmother   . Heart attack Maternal Grandfather   . Heart failure Brother       Prior to Admission medications   Medication Sig Start Date End Date Taking? Authorizing Provider  aspirin 81 MG chewable tablet Chew 1 tablet (81 mg total) by mouth daily. 02/12/17   Jani Gravel, MD  atorvastatin (LIPITOR) 80 MG tablet Take 1 tablet (80 mg total) by mouth daily at 6 PM. 09/04/16   Bedsole, Amy E, MD  cetirizine (ZYRTEC) 10 MG tablet TAKE 1 TABLET BY MOUTH EVERY DAY 10/20/17   Bedsole, Amy E, MD  Continuous Blood Gluc Receiver (FREESTYLE LIBRE READER) DEVI 1 Device by Does not apply route 3 (three) times daily. 04/03/17   Philemon Kingdom, MD  Continuous Blood Gluc Sensor (FREESTYLE LIBRE SENSOR SYSTEM) MISC 1 Device by Does not apply route every 30 (thirty) days. 04/03/17   Philemon Kingdom, MD  ezetimibe (ZETIA) 10 MG tablet Take 1 tablet (10 mg total) by mouth daily. 09/11/16   Bedsole, Amy E, MD  Insulin Glargine (BASAGLAR KWIKPEN) 100 UNIT/ML SOPN Inject 0.18 mLs (18 Units total) into the skin at bedtime. 09/23/17   Cherene Altes, MD  insulin lispro (HUMALOG KWIKPEN) 100 UNIT/ML KiwkPen Inject into skin 3 times a day at meal time according to the following sliding  scale: CBG 70-120 - 0 units / CBG 121-150 - 1 unit / CBG 151-200 - 2 units / CBG 201-250 - 3 units / CBG 251-300 - 5 units / CBG 301-350 - 7 units / CBG 351-400+ - 9 units  YOU MUST FOLLOW A STRICT DIABETIC DIET 09/23/17   Cherene Altes, MD  levothyroxine (SYNTHROID, LEVOTHROID) 150 MCG tablet Take 1 tablet (150 mcg total) by mouth daily. 04/03/17   Philemon Kingdom, MD  losartan (COZAAR) 25 MG tablet Take 25 mg by mouth daily. 05/27/17   [provider]  metoCLOPramide (REGLAN) 5 MG tablet TAKE 1 TABLET BY MOUTH 4  TIMES A DAY BEFORE MEALS AND AT BEDTIME 11/29/17   Bedsole, Amy E, MD  metoprolol succinate (TOPROL-XL) 25 MG 24 hr tablet Take 25 mg by mouth daily. 03/24/17   [provider]  ondansetron (ZOFRAN) 4 MG tablet TAKE 1 TABLET BY MOUTH EVERY 6 HOURS AS NEEDED FOR NAUSEA AND VOMITING 09/18/17   [name], Vernelle Emerald, MD  ONETOUCH VERIO test strip USE TO CHECK BLOOD SUGAR DAILY AS NEEDED. DX: E11.10 10/20/17   Bedsole, Amy E, MD  RELPAX 40 MG tablet ONE TABLET BY MOUTH AT ONSET OF HEADACHE. MAY REPEAT IN 2 HOURS IF HEADACHE PERSISTS OR RECURS. 10/17/16   Bedsole, Amy E, MD  sertraline (ZOLOFT) 50 MG tablet Take 1 tablet (50 mg total) by mouth daily. 09/02/17   Jinny Sanders, MD    Physical Exam:  Vitals:   12/11/17 1215 12/11/17 1230 12/11/17 1245 12/11/17 1300  BP: (!) 154/90 (!) 152/92 (!) 158/92 (!) 188/104  Pulse: 98 (!) 101 (!) 101 (!) 104  Resp: 15 12 (!) 23 11  Temp:      TempSrc:      SpO2: 99% 99% 99% 98%  Weight:      Height:       Constitutional: NAD, uncomfortable, ill-appearing  eyes: PERRL, lids and conjunctivae normal ENMT: Mucous membranes are dry, without exudate or lesions .  Several missing teeth Neck: normal, supple, no masses, no thyromegaly Respiratory: Decreased breath sounds at the bases, no rhonchi., no wheezing, no crackles. Normal respiratory effort  Cardiovascular: Regular rate and rhythm, no murmur, rubs or gallops. No extremity  edema. 2+ pedal pulses. No carotid bruits.  Abdomen: Soft, diffusely tender without rebound or guarding, no CVA tenderness.  Bowel sounds positive, nondistended, No hepatosplenomegaly. Musculoskeletal: no clubbing / cyanosis. Moves all extremities Skin: no jaundice, No lesions.  Neurologic: Sensation intact  Strength equal in all extremities Psychiatric:   Alert and oriented x 3.  Flat affect   Labs on Admission: I have personally reviewed following labs and imaging studies  CBC: Recent Labs  Lab 12/11/17 1019  WBC 15.4*  HGB 16.2*  HCT 46.6*  MCV 83.2  PLT 616    Basic Metabolic Panel: Recent Labs  Lab 12/11/17 1019  NA 135  K 3.6  CL 101  CO2 15*  GLUCOSE 461*  BUN 13  CREATININE 0.74  CALCIUM 9.3    GFR: Estimated Creatinine Clearance: 65.1 mL/min (by C-G formula based on SCr of 0.74 mg/dL).  Liver Function Tests: Recent Labs  Lab 12/11/17 1036  AST 22  ALT 15  ALKPHOS 77  BILITOT 0.9  PROT 7.4  ALBUMIN 4.1   Recent Labs  Lab 12/11/17 1056  LIPASE 41   No results for input(s): AMMONIA in the last 168 hours.  Coagulation Profile: No results for input(s): INR, PROTIME in the last 168 hours.  Cardiac Enzymes: No results for input(s): CKTOTAL, CKMB, CKMBINDEX, TROPONINI in the last 168 hours.  BNP (last 3 results) No results for input(s): PROBNP in the last 8760 hours.  HbA1C: No results for input(s): HGBA1C in the last 72 hours.  CBG: Recent Labs  Lab 12/11/17 1207 12/11/17 1322  GLUCAP 419* 363*    Lipid Profile: No results for input(s): CHOL, HDL, LDLCALC, TRIG, CHOLHDL, LDLDIRECT in the last 72 hours.  Thyroid Function Tests: No results for input(s): TSH, T4TOTAL, FREET4, T3FREE, THYROIDAB in the last 72 hours.  Anemia Panel: No results for input(s): VITAMINB12, FOLATE, FERRITIN, TIBC, IRON, RETICCTPCT in the  last 72 hours.  Urine analysis:    Component Value Date/Time   COLORURINE YELLOW 12/11/2017 1105   APPEARANCEUR HAZY  (A) 12/11/2017 1105   LABSPEC 1.010 12/11/2017 1105   PHURINE 5.5 12/11/2017 1105   GLUCOSEU >=500 (A) 12/11/2017 1105   HGBUR TRACE (A) 12/11/2017 1105   BILIRUBINUR NEGATIVE 12/11/2017 1105   BILIRUBINUR negative 09/11/2016 1038   KETONESUR 40 (A) 12/11/2017 1105   PROTEINUR NEGATIVE 12/11/2017 1105   UROBILINOGEN 0.2 09/11/2016 1038   UROBILINOGEN 0.2 10/28/2013 1723   NITRITE NEGATIVE 12/11/2017 1105   LEUKOCYTESUR NEGATIVE 12/11/2017 1105    Sepsis Labs: @LABRCNTIP (procalcitonin:4,lacticidven:4) )No results found for this or any previous visit (from the past 240 hour(s)).   Radiological Exams on Admission: Dg Chest 2 View  Result Date: 12/11/2017 CLINICAL DATA:  Chest pain and shortness of breath beginning yesterday. EXAM: CHEST  2 VIEW COMPARISON:  09/20/2017 FINDINGS: Heart size is normal. Mediastinal shadows are normal. The lungs are clear. No bronchial thickening. No infiltrate, mass, effusion or collapse. Pulmonary vascularity is normal. No bony abnormality. IMPRESSION: Normal chest Electronically Signed   By: Nelson Chimes M.D.   On: 12/11/2017 11:02    EKG: Independently reviewed.  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  Urine normal.  White count chronically elevated, today 15.4 .she was placed on insulin drip.  Also receiving generous IV fluids. Admit for SDU DKA order set, will monitor AG closure and will transition to sq insulin once able  Serial lactic acid  Serial BMET Correct electrolytes as needed N.p.o. for now Diabetes education  Intractable nausea and vomiting, likely due to above, history of IBS .  White count is 15, prior was in the 20s. Lipase normal at 41, LFTs normal.  Afebrile, recent CT of the abdomen and pelvis was without any intra-abdominal findings. Bowel rest Continue Zofran Resume Reglan in am  Continue IV fluids as above Advance diet as tolerated after DKA controlled   Peripheral neuropathy  Continue Neurontin   Hypertension BP (!) 142/100   Pulse 94  Continue home anti-hypertensive medications including metoprolol and Cozaar once able to tolerate p.o.'s.  Until then, will use IV hydralazine every 8 hours as needed for blood pressure greater than 160/90   Hyperlipidemia Continue home statins   Depression Continue home Zoloft   GERD, no acute symptoms Protonix IV daily   Hypothyroidism: Continue home Synthroid  History of headaches, no acute issues Continue Relpax   Anxiety and Depression Continue Xanax for anxiety and    for depression   DVT prophylaxis: Lovenox in view of nomal renal functions  Code Status:    Full Family Communication:  Discussed with patient Disposition Plan: Expect patient to be discharged to home after condition improves Consults called:    None Admission status: Stepdown inpatient  Sharene Butters, PA-C [facility]   12/11/2017, 1:41 PM

## 2017-12-11 NOTE — ED Notes (Addendum)
Pt unable to take any PO meds due to constant nausea. This RN spoke with pharmacy, they will adjust times for administration.

## 2017-12-11 NOTE — ED Notes (Signed)
Spoke with [place] with questions concerning insulin coverage as a bolus via infusion.  At this time Pt is NPO and the coverage is related to pt meal and carb coverage.  Therefore, no additional insulin coverage at this time.

## 2017-12-11 NOTE — ED Notes (Signed)
CBG 419 

## 2017-12-11 NOTE — ED Triage Notes (Signed)
Pt arrieves EMS from home with c/o chest pain and nausea. CBG noted as 522 by EMS and states she has been out of insulin for 3 days. Given 4 mg zofran IV and 324 Aspirin by EMS PTA.

## 2017-12-12 DIAGNOSIS — K219 Gastro-esophageal reflux disease without esophagitis: Secondary | ICD-10-CM

## 2017-12-12 DIAGNOSIS — F320 Major depressive disorder, single episode, mild: Secondary | ICD-10-CM

## 2017-12-12 DIAGNOSIS — E0842 Diabetes mellitus due to underlying condition with diabetic polyneuropathy: Secondary | ICD-10-CM

## 2017-12-12 DIAGNOSIS — K3184 Gastroparesis: Secondary | ICD-10-CM

## 2017-12-12 DIAGNOSIS — I10 Essential (primary) hypertension: Secondary | ICD-10-CM

## 2017-12-12 DIAGNOSIS — E7849 Other hyperlipidemia: Secondary | ICD-10-CM

## 2017-12-12 DIAGNOSIS — I5022 Chronic systolic (congestive) heart failure: Secondary | ICD-10-CM

## 2017-12-12 DIAGNOSIS — E1143 Type 2 diabetes mellitus with diabetic autonomic (poly)neuropathy: Secondary | ICD-10-CM

## 2017-12-12 DIAGNOSIS — F419 Anxiety disorder, unspecified: Secondary | ICD-10-CM

## 2017-12-12 DIAGNOSIS — E876 Hypokalemia: Secondary | ICD-10-CM

## 2017-12-12 DIAGNOSIS — F411 Generalized anxiety disorder: Secondary | ICD-10-CM

## 2017-12-12 LAB — BASIC METABOLIC PANEL
ANION GAP: 8 (ref 5–15)
Anion gap: 7 (ref 5–15)
Anion gap: 10 (ref 5–15)
Anion gap: 18 — ABNORMAL HIGH (ref 5–15)
Anion gap: 7 (ref 5–15)
BUN: 13 mg/dL (ref 6–20)
BUN: 12 mg/dL (ref 6–20)
BUN: 10 mg/dL (ref 6–20)
BUN: 6 mg/dL (ref 6–20)
BUN: 7 mg/dL (ref 6–20)
CALCIUM: 8.7 mg/dL — AB (ref 8.9–10.3)
CHLORIDE: 108 mmol/L (ref 101–111)
CHLORIDE: 97 mmol/L — AB (ref 101–111)
CHLORIDE: 104 mmol/L (ref 101–111)
CO2: 21 mmol/L — ABNORMAL LOW (ref 22–32)
CO2: 22 mmol/L (ref 22–32)
CO2: 20 mmol/L — ABNORMAL LOW (ref 22–32)
CO2: 20 mmol/L — ABNORMAL LOW (ref 22–32)
CO2: 24 mmol/L (ref 22–32)
CREATININE: 0.55 mg/dL (ref 0.44–1.00)
Calcium: 8.5 mg/dL — ABNORMAL LOW (ref 8.9–10.3)
Calcium: 8.5 mg/dL — ABNORMAL LOW (ref 8.9–10.3)
Calcium: 8.8 mg/dL — ABNORMAL LOW (ref 8.9–10.3)
Calcium: 8.4 mg/dL — ABNORMAL LOW (ref 8.9–10.3)
Chloride: 108 mmol/L (ref 101–111)
Chloride: 105 mmol/L (ref 101–111)
Creatinine, Ser: 0.48 mg/dL (ref 0.44–1.00)
Creatinine, Ser: 0.52 mg/dL (ref 0.44–1.00)
Creatinine, Ser: 0.62 mg/dL (ref 0.44–1.00)
Creatinine, Ser: 0.55 mg/dL (ref 0.44–1.00)
GFR calc Af Amer: >60 mL/min (ref >60)
GFR calc Af Amer: >60 mL/min (ref >60)
GFR calc non Af Amer: >60 mL/min (ref >60)
GFR calc non Af Amer: >60 mL/min (ref >60)
GFR calc non Af Amer: >60 mL/min (ref >60)
GFR calc non Af Amer: >60 mL/min (ref >60)
GFR calc non Af Amer: >60 mL/min (ref >60)
Glucose, Bld: 132 mg/dL — ABNORMAL HIGH (ref 65–99)
Glucose, Bld: 147 mg/dL — ABNORMAL HIGH (ref 65–99)
Glucose, Bld: 161 mg/dL — ABNORMAL HIGH (ref 65–99)
Glucose, Bld: 234 mg/dL — ABNORMAL HIGH (ref 65–99)
Glucose, Bld: 136 mg/dL — ABNORMAL HIGH (ref 65–99)
POTASSIUM: 3.4 mmol/L — AB (ref 3.5–5.1)
POTASSIUM: 2.7 mmol/L — AB (ref 3.5–5.1)
POTASSIUM: 2.8 mmol/L — AB (ref 3.5–5.1)
Potassium: 3.2 mmol/L — ABNORMAL LOW (ref 3.5–5.1)
Potassium: 2.7 mmol/L — CL (ref 3.5–5.1)
SODIUM: 137 mmol/L (ref 135–145)
SODIUM: 137 mmol/L (ref 135–145)
SODIUM: 135 mmol/L (ref 135–145)
SODIUM: 135 mmol/L (ref 135–145)
SODIUM: 135 mmol/L (ref 135–145)

## 2017-12-12 LAB — GLUCOSE, CAPILLARY
GLUCOSE-CAPILLARY: 162 mg/dL — AB (ref 65–99)
GLUCOSE-CAPILLARY: 160 mg/dL — AB (ref 65–99)
GLUCOSE-CAPILLARY: 180 mg/dL — AB (ref 65–99)
GLUCOSE-CAPILLARY: 153 mg/dL — AB (ref 65–99)
GLUCOSE-CAPILLARY: 167 mg/dL — AB (ref 65–99)
GLUCOSE-CAPILLARY: 89 mg/dL (ref 65–99)
Glucose-Capillary: 126 mg/dL — ABNORMAL HIGH (ref 65–99)
Glucose-Capillary: 145 mg/dL — ABNORMAL HIGH (ref 65–99)
Glucose-Capillary: 146 mg/dL — ABNORMAL HIGH (ref 65–99)
Glucose-Capillary: 158 mg/dL — ABNORMAL HIGH (ref 65–99)
Glucose-Capillary: 161 mg/dL — ABNORMAL HIGH (ref 65–99)
Glucose-Capillary: 164 mg/dL — ABNORMAL HIGH (ref 65–99)
Glucose-Capillary: 173 mg/dL — ABNORMAL HIGH (ref 65–99)
Glucose-Capillary: 182 mg/dL — ABNORMAL HIGH (ref 65–99)
Glucose-Capillary: 208 mg/dL — ABNORMAL HIGH (ref 65–99)
Glucose-Capillary: 213 mg/dL — ABNORMAL HIGH (ref 65–99)
Glucose-Capillary: 236 mg/dL — ABNORMAL HIGH (ref 65–99)

## 2017-12-12 LAB — COMPREHENSIVE METABOLIC PANEL
ALK PHOS: 68 U/L (ref 38–126)
ALT: 16 U/L (ref 14–54)
ANION GAP: 9 (ref 5–15)
AST: 30 U/L (ref 15–41)
Albumin: 3.3 g/dL — ABNORMAL LOW (ref 3.5–5.0)
BUN: 5 mg/dL — ABNORMAL LOW (ref 6–20)
CALCIUM: 8.8 mg/dL — AB (ref 8.9–10.3)
CO2: 23 mmol/L (ref 22–32)
Chloride: 105 mmol/L (ref 101–111)
Creatinine, Ser: 0.55 mg/dL (ref 0.44–1.00)
GLUCOSE: 198 mg/dL — AB (ref 65–99)
Potassium: 3.1 mmol/L — ABNORMAL LOW (ref 3.5–5.1)
Sodium: 137 mmol/L (ref 135–145)
Total Bilirubin: 0.6 mg/dL (ref 0.3–1.2)
Total Protein: 6.4 g/dL — ABNORMAL LOW (ref 6.5–8.1)

## 2017-12-12 LAB — LIPID PANEL
CHOLESTEROL: 256 mg/dL — AB (ref 0–200)
HDL: 55 mg/dL (ref >40)
LDL Cholesterol: 187 mg/dL — ABNORMAL HIGH (ref 0–99)
TRIGLYCERIDES: 71 mg/dL (ref <150)
Total CHOL/HDL Ratio: 4.7 RATIO
VLDL: 14 mg/dL (ref 0–40)

## 2017-12-12 LAB — CBC
HCT: 40.8 % (ref 36.0–46.0)
Hemoglobin: 13.6 g/dL (ref 12.0–15.0)
MCH: 27.4 pg (ref 26.0–34.0)
MCHC: 33.3 g/dL (ref 30.0–36.0)
MCV: 82.3 fL (ref 78.0–100.0)
PLATELETS: 310 10*3/uL (ref 150–400)
RBC: 4.96 MIL/uL (ref 3.87–5.11)
RDW: 14.2 % (ref 11.5–15.5)
WBC: 16 10*3/uL — ABNORMAL HIGH (ref 4.0–10.5)

## 2017-12-12 LAB — TROPONIN I: TROPONIN I: 0.09 ng/mL — AB (ref <0.03)

## 2017-12-12 LAB — MAGNESIUM
MAGNESIUM: 1.7 mg/dL (ref 1.7–2.4)
MAGNESIUM: 1.6 mg/dL — AB (ref 1.7–2.4)

## 2017-12-12 MED ORDER — METOCLOPRAMIDE HCL 5 MG/ML IJ SOLN
10.0000 mg | Freq: Three times a day (TID) | INTRAMUSCULAR | Status: DC
  Filled 2017-12-12: qty 2

## 2017-12-12 MED ORDER — SODIUM CHLORIDE 0.9 % IV SOLN
25.0000 mg | Freq: Once | INTRAVENOUS | Status: AC
  Administered 2017-12-12: 25 mg via INTRAVENOUS
  Filled 2017-12-12: qty 1

## 2017-12-12 MED ORDER — METOPROLOL TARTRATE 5 MG/5ML IV SOLN
INTRAVENOUS | Status: AC
  Filled 2017-12-12: qty 5

## 2017-12-12 MED ORDER — POTASSIUM CHLORIDE 10 MEQ/100ML IV SOLN
10.0000 meq | Freq: Once | INTRAVENOUS | Status: AC
  Administered 2017-12-12: 10 meq via INTRAVENOUS
  Filled 2017-12-12: qty 100

## 2017-12-12 MED ORDER — NITROGLYCERIN 0.4 MG SL SUBL
SUBLINGUAL_TABLET | SUBLINGUAL | Status: AC
  Filled 2017-12-12: qty 1

## 2017-12-12 MED ORDER — DEXTROSE 5 % IV SOLN
3.0000 g | Freq: Once | INTRAVENOUS | Status: AC
  Administered 2017-12-12: 3 g via INTRAVENOUS
  Filled 2017-12-12: qty 6

## 2017-12-12 MED ORDER — INSULIN GLARGINE 100 UNIT/ML ~~LOC~~ SOLN
10.0000 [IU] | Freq: Once | SUBCUTANEOUS | Status: AC
  Administered 2017-12-12: 10 [IU] via SUBCUTANEOUS
  Filled 2017-12-12: qty 0.1

## 2017-12-12 MED ORDER — POTASSIUM CHLORIDE 10 MEQ/100ML IV SOLN
10.0000 meq | INTRAVENOUS | Status: DC
  Administered 2017-12-12 (×3): 10 meq via INTRAVENOUS
  Filled 2017-12-12 (×3): qty 100

## 2017-12-12 MED ORDER — METOCLOPRAMIDE HCL 5 MG/ML IJ SOLN
10.0000 mg | Freq: Three times a day (TID) | INTRAMUSCULAR | Status: DC
  Administered 2017-12-12 – 2017-12-14 (×6): 10 mg via INTRAVENOUS
  Filled 2017-12-12 (×5): qty 2

## 2017-12-12 MED ORDER — INSULIN ASPART 100 UNIT/ML ~~LOC~~ SOLN
0.0000 [IU] | SUBCUTANEOUS | Status: DC
  Administered 2017-12-12 – 2017-12-13 (×3): 5 [IU] via SUBCUTANEOUS

## 2017-12-12 MED ORDER — METOPROLOL TARTRATE 5 MG/5ML IV SOLN
5.0000 mg | Freq: Once | INTRAVENOUS | Status: AC
  Administered 2017-12-12: 5 mg via INTRAVENOUS

## 2017-12-12 MED ORDER — NITROGLYCERIN 0.4 MG/HR TD PT24
0.4000 mg | MEDICATED_PATCH | Freq: Every day | TRANSDERMAL | Status: DC
  Administered 2017-12-12 – 2017-12-13 (×2): 0.4 mg via TRANSDERMAL
  Filled 2017-12-12 (×2): qty 1

## 2017-12-12 MED ORDER — INSULIN REGULAR HUMAN 100 UNIT/ML IJ SOLN
INTRAMUSCULAR | Status: DC
  Filled 2017-12-12 (×2): qty 1

## 2017-12-12 MED ORDER — METOPROLOL TARTRATE 5 MG/5ML IV SOLN
5.0000 mg | Freq: Three times a day (TID) | INTRAVENOUS | Status: DC
  Administered 2017-12-13: 5 mg via INTRAVENOUS
  Filled 2017-12-12: qty 5

## 2017-12-12 MED ORDER — POTASSIUM CHLORIDE 10 MEQ/100ML IV SOLN
10.0000 meq | INTRAVENOUS | Status: AC
  Administered 2017-12-12 (×3): 10 meq via INTRAVENOUS
  Filled 2017-12-12 (×3): qty 100

## 2017-12-12 MED ORDER — NITROGLYCERIN 0.4 MG SL SUBL: 0.4000 mg | SUBLINGUAL_TABLET | SUBLINGUAL | Status: DC | PRN

## 2017-12-12 MED ORDER — PANTOPRAZOLE SODIUM 40 MG IV SOLR
40.0000 mg | INTRAVENOUS | Status: DC
  Administered 2017-12-12: 40 mg via INTRAVENOUS
  Filled 2017-12-12: qty 40

## 2017-12-12 NOTE — Progress Notes (Signed)
PROGRESS NOTE    [name] [name]  KKX:[id number] DOB: 27-Nov-1961 DOA: 12/11/2017 PCP: Jinny Sanders, MD   Brief Narrative:  57 y.o. WF PMHx Anxiety, Depression, Polysubstance abuse, GERD,, HLD, Hypothyroidism, HTN, Idiopathic Cardiomyopathy (EF 55% in June 2018,, Diabetes Type 2 Uncontrolled with complication, (multiple admissions for DKA), diabetic peripheral neuropathy, Nephrolithiasis, CKD stage II most recently from 10/11 through 09/23/2017, for DKA and gastroparesis   Presenting with the same complaints today.  She has a history of medicine noncompliance.  She began having nausea, with multiple episodes of nonviolent, nonbloody vomiting, without diarrhea for the last 3 days; she also reports diffuse abdominal discomfort, at times radiating to the chest, similar to her prior admissions of note, these chest discomfort is almost identical to the prior one during the October admission.  Denies any fever, chills, night sweats, sick contacts, dysuria, diarrhea.  No confusion is reported.   She reports early deep seizure, and polyuria .She admits to not having taking her insulin as scheduled, stating that she has a hard time staying on track with last insulin about 1 week ago.     ED Course:  BP (!) 188/104   Pulse (!) 104   Temp 97.6 F (36.4 C) (Oral)   Resp 11   Ht 5' 0.5" (1.537 m)   Wt 58.1 kg (128 lb)   LMP 08/11/2012   SpO2 98%   BMI 24.59 kg/m    CBGs was noted to be in 522, anion gap 19, CO2 15 she was placed on insulin drip Also receiving generous IV fluids. Given IV Zofran and 324 mg of aspirin by EMS PTA Potassium 3.6, creatinine 0.74, calcium normal at 9.3, lipase 41, LFTs normal, troponin  0.03, EKG sinus rhythm without any ACS White count 15.4, this has been elevated chronically, but improved from prior admission, when on arrival was 20.  Hemoglobin 16.2, in the setting of dehydration, platelets 348 Urine negative Chest x-ray NAD    Subjective: 1/3  A/O 4, negative CP,  positive abdominal pain, positive N/V, negative SOB. States Dr. Wynonia Lawman cardiology told her she no longer had to see him. Has not been taking diabetic medication state secondary to her daughter getting into some trouble.    Assessment & Plan:   Active Problems:   Hypothyroidism   B12 deficiency   Hyperlipidemia   Generalized anxiety disorder   Peripheral neuropathy ([name])   GERD   Irritable bowel syndrome   Essential hypertension   Osteoporosis   Vitamin D deficiency   Kidney disease, chronic, stage 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)   Gastroparesis due to DM ([name])   Nonischemic cardiomyopathy ([place])   DKA, type 1 ([place])   Migraines   DKA (diabetic ketoacidoses) ([name])  DKA/Diabetes Type 2 uncontrolled with complication -1/2 Hemoglobin A1c= 11.1 -Most recently admitted October 2018 secondary to noncompliance with diabetic medication. -Nausea/vomiting and lactic acidosis., CBG 522, AG 19 on admission -Lantus 10 units now then 2 hours later DC insulin drip   Diabetic Gastroparesis/ Intractable nausea and vomiting,  -Reglan IV 10 mg TID -Zofran QID -Thorazine IV 25 mg 1 ONLY -Lipase normal -NPO until nausea resolves   Diabetic Peripheral neuropathy  -Continue Neurontin  able to take PO  Chronic Systolic CHF -EF 16-96% on echocardiogram 09/20/2017 -Strict in and out since admission +752 ml -Daily weight Filed Weights   12/11/17 0948 12/12/17 0325  Weight: 128 lb (58.1 kg) 134 lb 11.2 oz (61.1 kg)  -Transfuse for hemoglobin<8  -1/3 consulted cardiology  -  Metoprolol IV 5 mg TID; until able to take PO by mouth Toprol 100 mg -Patch NTG 0.4 mg onset of pain -Cycle troponin: Patient complained of chest pain   Essential Hypertension BP (!) 142/100   Pulse 94  -Unable to take home BP medication secondary to nausea and vomiting.  -See CHF   Hyperlipidemia -Lipid panel pending -Continue statins unable to take PO    Depression -Zoloft unable to take PO     GERD,  -Protonix IV 40 mg  daily     Hypothyroidism: Continue home Synthroid   History of headaches, no acute issues Continue Relpax    Anxiety and Depression Continue Xanax for anxiety and    for depression   Hypokalemia -Potassium goal> 4 -Potassium IV 50 mEq -Recheck K/Mg @1500   Hypomagnesemia -Magnesium goal> 2 -Magnesium IV 3 g   Goals of care - 1/3 PALLIATIVE CARE consult noncompliant diabetic with multiple medical admissions discuss change of CODE STATUS to DNR, short-term vs long-term goals of care, vs space    DVT prophylaxis: Lovenox Code Status: Full Family Communication: None Disposition Plan: TBD   Consultants:  Cardiology  Procedures/Significant Events:   09/20/2017 Echocardiogram:- Left ventricle: LVEF=  30% to 35%. Moderate diffuse hypokinesis with   regional variations.   I have personally reviewed and interpreted all radiology studies and my findings are as above.  VENTILATOR SETTINGS:    Cultures   Antimicrobials:    Devices    LINES / TUBES:      Continuous Infusions: . sodium chloride Stopped (12/11/17 1650)  . dextrose 5 % and 0.45% NaCl 100 mL/hr at 12/11/17 1900  . insulin (NOVOLIN-R) infusion 2.4 Units/hr (12/12/17 0735)  . potassium chloride 10 mEq (12/12/17 0814)     Objective: Vitals:   12/11/17 1921 12/11/17 2312 12/12/17 0325 12/12/17 0700  BP: 139/90 112/85 120/80 126/80  Pulse: (!) 102 90 81   Resp: 19 14 14    Temp: (!) 97.4 F (36.3 C) 98.2 F (36.8 C) 98.3 F (36.8 C) 97.9 F (36.6 C)  TempSrc: Oral Oral Oral Oral  SpO2: 99% 98% 97%   Weight:   134 lb 11.2 oz (61.1 kg)   Height:        Intake/Output Summary (Last 24 hours) at 12/12/2017 0825 Last data filed at 12/12/2017 0731 Gross per 24 hour  Intake 1902.08 ml  Output 600 ml  Net 1302.08 ml   Filed Weights   12/11/17 0948 12/12/17 0325  Weight: 128 lb (58.1 kg) 134 lb 11.2 oz (61.1 kg)    Examination:  General: A/O 4, No acute respiratory distress ENT:  Negative Runny nose, negative gingival bleeding, extremely poor dentation. Neck:  Negative scars, masses, torticollis, lymphadenopathy, JVD Lungs: Clear to auscultation bilaterally without wheezes or crackles Cardiovascular: Tachycardic, Regular rhythm without murmur gallop or rub normal S1 and S2 Abdomen: Positive abdominal pain, nondistended, positive soft, bowel sounds, no rebound, no ascites, no appreciable mass Extremities: No significant cyanosis, clubbing, or edema bilateral lower extremities Skin: Negative rashes, lesions, ulcers Psychiatric:  Negative depression, negative anxiety, negative fatigue, negative mania , poor understanding of disease process Central nervous system:  Cranial nerves II through XII intact, tongue/uvula midline, all extremities muscle strength 5/5, sensation intact throughout,  negative dysarthria, negative expressive aphasia, negative receptive aphasia.  .     Data Reviewed: Care during the described time interval was provided by me .  I have reviewed this patient's available data, including medical history, events of note, physical examination, and  all test results as part of my evaluation.   CBC: Recent Labs  Lab 12/11/17 1019 12/12/17 0237  WBC 15.4* 16.0*  HGB 16.2* 13.6  HCT 46.6* 40.8  MCV 83.2 82.3  PLT 348 010   Basic Metabolic Panel: Recent Labs  Lab 12/11/17 1019 12/11/17 1416 12/11/17 2302 12/12/17 0237  NA 135 139 137 137  K 3.6 4.2 3.2* 2.7*  CL 101 105 108 108  CO2 15* 19* 21* 22  GLUCOSE 461* 286* 132* 147*  BUN 13 11 13 12   CREATININE 0.74 0.81 0.55 0.48  CALCIUM 9.3 8.7* 8.7* 8.5*   GFR: Estimated Creatinine Clearance: 66.6 mL/min (by C-G formula based on SCr of 0.48 mg/dL). Liver Function Tests: Recent Labs  Lab 12/11/17 1036  AST 22  ALT 15  ALKPHOS 77  BILITOT 0.9  PROT 7.4  ALBUMIN 4.1   Recent Labs  Lab 12/11/17 1056  LIPASE 41   No results for input(s): AMMONIA in the last 168 hours. Coagulation  Profile: No results for input(s): INR, PROTIME in the last 168 hours. Cardiac Enzymes: No results for input(s): CKTOTAL, CKMB, CKMBINDEX, TROPONINI in the last 168 hours. BNP (last 3 results) No results for input(s): PROBNP in the last 8760 hours. HbA1C: Recent Labs    12/11/17 1416  HGBA1C 11.1*   CBG: Recent Labs  Lab 12/12/17 0324 12/12/17 0425 12/12/17 0529 12/12/17 0623 12/12/17 0729  GLUCAP 162* 160* 173* 180* 182*   Lipid Profile: No results for input(s): CHOL, HDL, LDLCALC, TRIG, CHOLHDL, LDLDIRECT in the last 72 hours. Thyroid Function Tests: Recent Labs    12/11/17 1515  TSH 0.016*  FREET4 1.57*   Anemia Panel: No results for input(s): VITAMINB12, FOLATE, FERRITIN, TIBC, IRON, RETICCTPCT in the last 72 hours. Urine analysis:    Component Value Date/Time   COLORURINE YELLOW 12/11/2017 1105   APPEARANCEUR HAZY (A) 12/11/2017 1105   LABSPEC 1.010 12/11/2017 1105   PHURINE 5.5 12/11/2017 1105   GLUCOSEU >=500 (A) 12/11/2017 1105   HGBUR TRACE (A) 12/11/2017 1105   BILIRUBINUR NEGATIVE 12/11/2017 1105   BILIRUBINUR negative 09/11/2016 1038   KETONESUR 40 (A) 12/11/2017 1105   PROTEINUR NEGATIVE 12/11/2017 1105   UROBILINOGEN 0.2 09/11/2016 1038   UROBILINOGEN 0.2 10/28/2013 1723   NITRITE NEGATIVE 12/11/2017 1105   LEUKOCYTESUR NEGATIVE 12/11/2017 1105   Sepsis Labs: @LABRCNTIP (procalcitonin:4,lacticidven:4)  ) Recent Results (from the past 240 hour(s))  MRSA PCR Screening     Status: None   Collection Time: 12/11/17  6:42 PM  Result Value Ref Range Status   MRSA by PCR NEGATIVE NEGATIVE Final    Comment:        The GeneXpert MRSA Assay (FDA approved for NASAL specimens only), is one component of a comprehensive MRSA colonization surveillance program. It is not intended to diagnose MRSA infection nor to guide or monitor treatment for MRSA infections.          Radiology Studies: Dg Chest 2 View  Result Date: 12/11/2017 CLINICAL DATA:   Chest pain and shortness of breath beginning yesterday. EXAM: CHEST  2 VIEW COMPARISON:  09/20/2017 FINDINGS: Heart size is normal. Mediastinal shadows are normal. The lungs are clear. No bronchial thickening. No infiltrate, mass, effusion or collapse. Pulmonary vascularity is normal. No bony abnormality. IMPRESSION: Normal chest Electronically Signed   By: Nelson Chimes M.D.   On: 12/11/2017 11:02        Scheduled Meds: . aspirin  81 mg Oral Daily  . atorvastatin  80 mg  Oral q1800  . enoxaparin (LOVENOX) injection  40 mg Subcutaneous Q24H  . ezetimibe  10 mg Oral Daily  . insulin regular  0-10 Units Intravenous TID WC  . levothyroxine  150 mcg Oral QAC breakfast  . losartan  25 mg Oral Daily  . metoprolol succinate  25 mg Oral Daily  . sertraline  50 mg Oral Daily   Continuous Infusions: . sodium chloride Stopped (12/11/17 1650)  . dextrose 5 % and 0.45% NaCl 100 mL/hr at 12/11/17 1900  . insulin (NOVOLIN-R) infusion 2.4 Units/hr (12/12/17 0735)  . potassium chloride 10 mEq (12/12/17 0814)     LOS: 1 day    Time spent: 40 minutes    WOODS, Geraldo Docker, MD [facility] Pager [phone]   If 7PM-7AM, please contact night-coverage [email or web address] Password [email or web address] 12/12/2017, 8:25 AM

## 2017-12-12 NOTE — Progress Notes (Signed)
Pt. Refused all PO meds today due to n/v. HR got in the 140s. MD notified. 5mg  of Metoprolol give. Pt. Reports chest pain. Refused Nitro sublingual bc she gets red in the face. Will switch over to patch as she said side effects didn't affect her with a patch.

## 2017-12-12 NOTE — Progress Notes (Addendum)
Inpatient Diabetes Program Recommendations  AACE/ADA: New Consensus Statement on Inpatient Glycemic Control (2015)  Target Ranges:  Prepandial:   less than 140 mg/dL      Peak postprandial:   less than 180 mg/dL (1-2 hours)      Critically ill patients:  140 - 180 mg/dL   Lab Results  Component Value Date   GLUCAP 146 (H) 12/12/2017   HGBA1C 11.1 (H) 12/11/2017    Review of Glycemic Control Results for [name] [name], [name] [name] (MRN [id number]) as of 12/12/2017 09:12  Ref. Range 12/12/2017 05:29 12/12/2017 06:23 12/12/2017 07:29 12/12/2017 07:52 12/12/2017 08:47  Glucose-Capillary Latest Ref Range: 65 - 99 mg/dL 173 (H) 180 (H) 182 (H)  146 (H)   Diabetes history: Type 2 DM, poorly controlled Outpatient Diabetes medications: Lantus 18 Units QHS, Humalog TID SSI Current orders for Inpatient glycemic control: insulin drip, insulin regular bolus 0-10 Units TID [facility]  Inpatient Diabetes Program Recommendations:     Noted patient on insulin drip for DKA with history of multiple admissions for DKA and poor compliance. Was discharged from Dr Arman Filter office, so patient will need another endocrinologist. Will plan to see patient today and evaluate needs prior to discharge.   Once ready to discontinue insulin drip, consider Lantus 12 Units two hours prior and progress to Phase 2 of DKA order set. Once diet advances, also consider Novolog 0-9 TID AC and HS and Novolog 3 Units TID AC.  Addendum: Spoke with patient regarding management at home. Patient has the necessary tools to test blood sugars that include test strips and a meter and knows how to use them. She states, "Things got rough recently with my family and I just didn't feel like keeping up." She verified that her prescribed dose was Lantus 18 units QHS and Humalog SSI and that she had the medications, she just didn't take them.  During our discussion, the shielded her face and had a flat affect. We discussed the importance of continuing her regimen or she would  be readmitted. Briefly, we discussed her A1C of 11% and the urgency to get it down. She does have a PCP, but will need a endocrinologist at the time of discharge. At this time, the patient does not have any questions.   Thanks, Bronson Curb, MSN, RNC-OB Diabetes Coordinator [phone] (8a-5p)

## 2017-12-12 NOTE — Consult Note (Signed)
Cardiology Consult    Patient ID: [name] MRN: [id number], DOB/AGE: 13-Nov-1961   Admit date: 12/11/2017 Date of Consult: 12/12/2017  Primary Physician: Jinny Sanders, MD Primary Cardiologist: Percival Spanish Requesting Provider: Sherral Hammers Reason for Consultation: Low EF  [name] [name] is a 57 y.o. female who is being seen today for the evaluation of Low EF at the request of Dr. Sherral Hammers.  Patient Profile    57 yo female with PMH of NICM, IDDM, HTN, HL, CKD II, hypothyroidism, and GERD who presented with n/v and abd pain.   Past Medical History   Past Medical History:  Diagnosis Date  . Anxiety   . B12 deficiency   . Chest pain 04/05/2017  . GERD (gastroesophageal reflux disease)   . Hyperlipidemia   . Hypertension    [name]  . Hypothyroidism   . IBS (irritable bowel syndrome)   . Intermittent vertigo   . 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 Leg cramping    "@ night" (09/18/2013)  . Migraine    "once q couple months" (09/18/2013)  . Osteoporosis   . Peripheral neuropathy   . Type II diabetes mellitus ([place])   . Umbilical hernia    unrepaired (09/18/2013)    Past Surgical History:  Procedure Laterality Date  . CESAREAN SECTION  1989  . LAPAROSCOPIC ASSISTED VAGINAL HYSTERECTOMY  09/23/2012   Procedure: LAPAROSCOPIC ASSISTED VAGINAL HYSTERECTOMY;  Surgeon: Linda Hedges, DO;  Location: [facility];  Service: Gynecology;  Laterality: N/A;  pull Dr Gregor Hams instrument  . LEFT HEART CATH AND CORONARY ANGIOGRAPHY N/A 02/11/2017   Procedure: Left Heart Cath and Coronary Angiography;  Surgeon: Lorretta Harp, MD;  Location: [facility];  Service: Cardiovascular;  Laterality: N/A;  . TUBAL LIGATION Bilateral 1992  . VAGINAL HYSTERECTOMY  2013     Allergies  Allergies  Allergen Reactions  . Codeine Hives, Anxiety and Other (See Comments)    History of Present Illness    [name] is a 57 yo female with PMH of NICM, IDDM, HTN, HL, CKD II,  hypothyroidism, and GERD. She was seen by cardiology back in 3/18 in regards to elevated troponin that peaked at 0.30. Echo at that time showed EF of 35-40% with hypokinesis in the mid-apicalanterior, lateral and inferior myocardium. Given findings and CRFs she underwent cardiac cath that showed normal coronaries. She was not started on BB during that admission as her blood pressures were too soft. Was able to be started on both BB and ARB as outpatient after that time. Appears to have been on these medications for over 6 months. She has had multiple admission for DKA with n/v/abd pain over the past 6 months. Reports being compliant with home medications but under significant stress with her family for the past several months.   She reports being independent at home with her ADLs. No chest pain, dyspnea, orthopnea, PND or LE edema. Last echo 10/18 showed slight further decline in EF to 30-35% with similar WMA as noted back in 3/18, and was suggestive of stress cardiomyopathy.   Presented to the ED on 12/11/16 with several days of n/v and abd pain. Reports having been noncompliant with her home medications, and CBG was 522. Other labs showed stable electrolytes, Cr 0.74, Hgb 16.2. Hgb A1c 11.1, TSH 0.016. EKG showed SR with no acute ST/T wave changes. CXR negative. She was admitted for DKA and placed on IV insulin. Cardiology was consulted in regards to continued  reduced EF.   Inpatient Medications    . aspirin  81 mg Oral Daily  . atorvastatin  80 mg Oral q1800  . enoxaparin (LOVENOX) injection  40 mg Subcutaneous Q24H  . ezetimibe  10 mg Oral Daily  . insulin regular  0-10 Units Intravenous TID WC  . levothyroxine  150 mcg Oral QAC breakfast  . losartan  25 mg Oral Daily  . metoCLOPramide (REGLAN) injection  10 mg Intravenous Q8H  . metoprolol succinate  25 mg Oral Daily  . sertraline  50 mg Oral Daily    Family History    Family History  Problem Relation Age of Onset  . Diabetes Other   .  Heart disease Other   . Heart failure Mother   . Diabetes Maternal Grandmother   . Alzheimer's disease Maternal Grandmother   . Coronary artery disease Maternal Grandmother   . Heart attack Maternal Grandfather   . Heart failure Brother     Social History    Social History   Socioeconomic History  . Marital status: Married    Spouse name: Not on file  . Number of children: 2  . Years of education: Not on file  . Highest education level: Not on file  Social Needs  . Financial resource strain: Not on file  . Food insecurity - worry: Not on file  . Food insecurity - inability: Not on file  . Transportation needs - medical: Not on file  . Transportation needs - non-medical: Not on file  Occupational History  . Occupation: UNEMPLOYED    Fish farm manager: UNEMPLOYED    Comment: homemaker  Tobacco Use  . Smoking status: Former Smoker    Years: 0.20    Types: Cigarettes    Last attempt to quit: 12/10/1990    Years since quitting: 27.0  . Smokeless tobacco: Never Used  Substance and Sexual Activity  . Alcohol use: No    Alcohol/week: 0.0 oz  . Drug use: Yes    Frequency: 3.0 times per week    Types: Marijuana  . Sexual activity: Yes  Other Topics Concern  . Not on file  Social History Narrative   Regular exercise- no    Diet- lots of mountain dew, limits fast foods      Review of Systems    See HPI  All other systems reviewed and are otherwise negative except as noted above.  Physical Exam    Blood pressure 126/80, pulse 81, temperature (!) 97.5 F (36.4 C), temperature source Oral, resp. rate 14, height 5' 0.5" (1.537 m), weight 134 lb 11.2 oz (61.1 kg), last menstrual period 08/11/2012, SpO2 97 %.  General: Disheveled, ill WF, appears older than stated age, NAD Psych: Normal affect. Neuro: Alert and oriented X 3. Moves all extremities spontaneously. HEENT: Normal  Neck: Supple, no JVD. Lungs:  Resp regular and unlabored, CTA. Heart: RRR no s3, s4, soft systolic  murmur. Abdomen: Soft, non-tender, non-distended, BS + x 4.  Extremities: No clubbing, cyanosis or edema. DP/PT/Radials 2+ and equal bilaterally.  Labs    Troponin (Point of Care Test) Recent Labs    12/11/17 1036  TROPIPOC 0.03   No results for input(s): CKTOTAL, CKMB, TROPONINI in the last 72 hours. Lab Results  Component Value Date   WBC 16.0 (H) 12/12/2017   HGB 13.6 12/12/2017   HCT 40.8 12/12/2017   MCV 82.3 12/12/2017   PLT 310 12/12/2017    Recent Labs  Lab 12/12/17 1239  NA 137  K  3.1*  CL 105  CO2 23  BUN 5*  CREATININE 0.55  CALCIUM 8.8*  PROT 6.4*  BILITOT 0.6  ALKPHOS 68  ALT 16  AST 30  GLUCOSE 198*   Lab Results  Component Value Date   CHOL 243 (H) 06/04/2017   HDL 47 06/04/2017   LDLCALC 168 (H) 06/04/2017   TRIG 140 06/04/2017   No results found for: [facility]   Radiology Studies    Dg Chest 2 View  Result Date: 12/11/2017 CLINICAL DATA:  Chest pain and shortness of breath beginning yesterday. EXAM: CHEST  2 VIEW COMPARISON:  09/20/2017 FINDINGS: Heart size is normal. Mediastinal shadows are normal. The lungs are clear. No bronchial thickening. No infiltrate, mass, effusion or collapse. Pulmonary vascularity is normal. No bony abnormality. IMPRESSION: Normal chest Electronically Signed   By: Nelson Chimes M.D.   On: 12/11/2017 11:02    ECG & Cardiac Imaging    EKG: SR   Echo: 10/18  Study Conclusions  - Left ventricle: The cavity size was normal. Systolic function was   moderately to severely reduced. The estimated ejection fraction   was in the range of 30% to 35%. Moderate diffuse hypokinesis with   regional variations. Due to tachycardia, there was fusion of   early and atrial contributions to ventricular filling. The study   is not technically sufficient to allow evaluation of LV diastolic   function. - Mitral valve: There was mild regurgitation.  Impressions:  - The wall motion abnormality has a similar distribution to  March   2018, but is more severe. It does not follow typical coronary   distribution and a nonischemic cardiomyopathy is suspected.   Overall LVEF has deteriorated. The regional wall motion   abnormality distribution is suggestive of stress cardiomyopathy,   but this is not a chronic disorder and is only infrequently a   recurrent disorder.  Assessment & Plan    57 yo female with PMH of NICM, IDDM, HTN, HL, CKD II, hypothyroidism, and GERD who presented with n/v and abd pain.   1. Chronic systolic HF/NICM: Noted on echo back in 3/18 and underwent cath showing normal coronaries. Last echo 10/18 showed continued low EF of 30% with similar WMA noted back in march. She has been on Toprol and ARB therapy for over 6 months but there have been notes of medication compliance in the past. She does not have any signs of HF on exam. EKG non-acute. Does note palpitations but only when she is anxious. No syncope. Telemetry reviewed with occasional PVCS, but no episodes of NSVT. At this time would continue with her current medical therapy with BB and ARB. She needs to establish and maintain follow up with cardiology at time of discharge.   2. DKA w n/v: CBG 522 on admission. Hgb A1c 11.1. Currently on IV insulin gtt. Management per primary  3. HTN: Continue on current therapy  4. HL: on statin  5. Hypothyroidism: TSH 0.016, free T4 1.57. Adjustment per primary   6. Hypokalemia: 3.1 today. Replete  Signed, Reino Bellis, NP-C Pager [phone] 12/12/2017, 2:41 PM  Patient examined chart reviewed. Patient has no cardiac complaints She was admitted with abdominal pain and DKA Exam with chronically ill white female Clear lungs no murmur no edema. Abdomen soft with BS. She has no CAD by recent cath and EF not much different from previous cath. ECG is normal including QT. Not clear if she is compliant with her beta blocker and ARB. Would continue  these tow drugs. No indication for further cardiac  evaluation or AICD  Jenkins Rouge

## 2017-12-13 DIAGNOSIS — Z7189 Other specified counseling: Secondary | ICD-10-CM

## 2017-12-13 DIAGNOSIS — Z515 Encounter for palliative care: Secondary | ICD-10-CM

## 2017-12-13 DIAGNOSIS — I428 Other cardiomyopathies: Secondary | ICD-10-CM

## 2017-12-13 LAB — BASIC METABOLIC PANEL WITH GFR
Anion gap: 9 (ref 5–15)
BUN: 7 mg/dL (ref 6–20)
CO2: 22 mmol/L (ref 22–32)
Calcium: 8.7 mg/dL — ABNORMAL LOW (ref 8.9–10.3)
Chloride: 103 mmol/L (ref 101–111)
Creatinine, Ser: 0.54 mg/dL (ref 0.44–1.00)
GFR calc Af Amer: >60 mL/min
GFR calc non Af Amer: >60 mL/min
Glucose, Bld: 149 mg/dL — ABNORMAL HIGH (ref 65–99)
Potassium: 3.9 mmol/L (ref 3.5–5.1)
Sodium: 134 mmol/L — ABNORMAL LOW (ref 135–145)

## 2017-12-13 LAB — GLUCOSE, CAPILLARY
GLUCOSE-CAPILLARY: 96 mg/dL (ref 65–99)
Glucose-Capillary: 229 mg/dL — ABNORMAL HIGH (ref 65–99)
Glucose-Capillary: 87 mg/dL (ref 65–99)
Glucose-Capillary: 147 mg/dL — ABNORMAL HIGH (ref 65–99)
Glucose-Capillary: 165 mg/dL — ABNORMAL HIGH (ref 65–99)
Glucose-Capillary: 158 mg/dL — ABNORMAL HIGH (ref 65–99)

## 2017-12-13 LAB — MAGNESIUM: MAGNESIUM: 1.9 mg/dL (ref 1.7–2.4)

## 2017-12-13 LAB — TROPONIN I
Troponin I: 0.07 ng/mL (ref <0.03)
Troponin I: 0.04 ng/mL (ref <0.03)

## 2017-12-13 MED ORDER — POTASSIUM CHLORIDE 10 MEQ/100ML IV SOLN
10.0000 meq | INTRAVENOUS | Status: DC
  Administered 2017-12-13: 10 meq via INTRAVENOUS
  Filled 2017-12-13: qty 100

## 2017-12-13 MED ORDER — ACETAMINOPHEN 650 MG RE SUPP: 650.0000 mg | Freq: Four times a day (QID) | RECTAL | Status: DC | PRN

## 2017-12-13 MED ORDER — METOPROLOL SUCCINATE ER 25 MG PO TB24: 25.0000 mg | ORAL_TABLET | Freq: Every day | ORAL | Status: DC

## 2017-12-13 MED ORDER — INSULIN ASPART 100 UNIT/ML ~~LOC~~ SOLN: 0.0000 [IU] | Freq: Every day | SUBCUTANEOUS | Status: DC

## 2017-12-13 MED ORDER — NITROGLYCERIN 0.2 MG/HR TD PT24
0.2000 mg | MEDICATED_PATCH | Freq: Every day | TRANSDERMAL | Status: DC
  Administered 2017-12-14: 0.2 mg via TRANSDERMAL
  Filled 2017-12-13: qty 1

## 2017-12-13 MED ORDER — ACETAMINOPHEN 325 MG PO TABS
650.0000 mg | ORAL_TABLET | Freq: Four times a day (QID) | ORAL | Status: DC | PRN
  Administered 2017-12-13 – 2017-12-14 (×2): 650 mg via ORAL
  Filled 2017-12-13 (×2): qty 2

## 2017-12-13 MED ORDER — PANTOPRAZOLE SODIUM 40 MG PO TBEC
40.0000 mg | DELAYED_RELEASE_TABLET | Freq: Every day | ORAL | Status: DC
  Administered 2017-12-13: 40 mg via ORAL
  Filled 2017-12-13: qty 1

## 2017-12-13 MED ORDER — INSULIN GLARGINE 100 UNIT/ML ~~LOC~~ SOLN
18.0000 [IU] | Freq: Every day | SUBCUTANEOUS | Status: DC
  Administered 2017-12-14: 18 [IU] via SUBCUTANEOUS
  Filled 2017-12-13 (×2): qty 0.18

## 2017-12-13 MED ORDER — POTASSIUM CHLORIDE CRYS ER 20 MEQ PO TBCR
40.0000 meq | EXTENDED_RELEASE_TABLET | Freq: Once | ORAL | Status: AC
  Administered 2017-12-13: 40 meq via ORAL
  Filled 2017-12-13: qty 2

## 2017-12-13 MED ORDER — INSULIN ASPART 100 UNIT/ML ~~LOC~~ SOLN
0.0000 [IU] | Freq: Three times a day (TID) | SUBCUTANEOUS | Status: DC
  Administered 2017-12-13: 2 [IU] via SUBCUTANEOUS
  Administered 2017-12-14: 3 [IU] via SUBCUTANEOUS

## 2017-12-13 MED ORDER — INSULIN ASPART 100 UNIT/ML ~~LOC~~ SOLN
4.0000 [IU] | Freq: Three times a day (TID) | SUBCUTANEOUS | Status: DC
  Administered 2017-12-13 – 2017-12-14 (×2): 4 [IU] via SUBCUTANEOUS

## 2017-12-13 MED ORDER — BASAGLAR KWIKPEN 100 UNIT/ML ~~LOC~~ SOPN: 18.0000 [IU] | PEN_INJECTOR | Freq: Every day | SUBCUTANEOUS | Status: DC

## 2017-12-13 NOTE — Progress Notes (Signed)
Patient trasfered from [facility] to [phone] via wheelchair; alert and oriented x 4; no complaints of pain; IV saline locked in LAC and RFA; skin intact. Orient patient to room and unit; instructed how to use the call bell and  fall risk precautions. Will continue to monitor the patient.

## 2017-12-13 NOTE — Progress Notes (Signed)
Inpatient Diabetes Program Recommendations  AACE/ADA: New Consensus Statement on Inpatient Glycemic Control (2015)  Target Ranges:  Prepandial:   less than 140 mg/dL      Peak postprandial:   less than 180 mg/dL (1-2 hours)      Critically ill patients:  140 - 180 mg/dL   Lab Results  Component Value Date   GLUCAP 229 (H) 12/13/2017   HGBA1C 11.1 (H) 12/11/2017    Review of Glycemic Control Results for [name] [name], [name] [name] (MRN [id number]) as of 12/13/2017 09:13  Ref. Range 12/12/2017 23:24 12/13/2017 02:10 12/13/2017 03:09 12/13/2017 08:53  Glucose-Capillary Latest Ref Range: 65 - 99 mg/dL 89  96 229 (H)  Diabetes history: Type 2 DM, poorly controlled Outpatient Diabetes medications: Lantus 18 Units QHS, Humalog TID SSI Current orders for Inpatient glycemic control: Novolog 0-15 Units Q4H  Inpatient Diabetes Program Recommendations:     Patient still NPO, despite orders for discharge. Placed orders for additional recommendations from case management and social work, pending palliative care consult. If remains inpatient today, would recommend increasing Lantus to 14 Units QD. When diet advances, also consider Novolog 0-9 TID AC & HS and Novolog 3 Units TID AC.   Thanks, Bronson Curb, MSN, RNC-OB Diabetes Coordinator [phone] (8a-5p)

## 2017-12-13 NOTE — Progress Notes (Signed)
[name] [name] - Stepdown/ICU TEAM  MARCHELE DECOCK  QIH:[id number] DOB: Jul 14, 1961 DOA: [name]/01/2018 PCP: Jinny Sanders, MD    Brief Narrative:  52yoF w/ a Hx Anxiety, Depression, Polysubstance abuse, GERD, HLD, Hypothyroidism, HTN, Idiopathic Cardiomyopathy, uncontrolled DM2 (multiple admissions for DKA), diabetic peripheral neuropathy, Nephrolithiasis, and CKD stage II who presented w/ N/V/D and was found to be in DKA.  She admitted to not having taking her insulin as scheduled, with last insulin about [name] week prior to this admit.  Significant Events: [name]/2 admit   Subjective: Reports that her N/V have markedly improved.  Denies current cp, sob, n/v, or abdom pain.  Feels the nitro patch has resolved her chest pain and asks that it be continued.    Assessment & Plan:  DKA - uncontrolled DM2 - noncompliance  [name]/2 A1c 11.[name] - admitted October 2018 secondary to noncompliance with diabetic medication - has been counseled again on absolute need for medication compliance - PC consulted by Dr. Sherral Hammers to reinforce this recommendation   Diabetic Gastroparesis / Intractable nausea and vomiting Cont Reglan for now - primary tx will be control of CBG once consistent intake achieved   Diabetic Peripheral neuropathy  Continue Neurontin- well controlled at this time   Chronic Systolic CHF EF 87-56% on TTE 09/20/2017 - normal coronaries on cath March 2018 - cont BB and ARB - no clinical evidence of volume overload at this time   [facility] Weights   12/11/17 0948 12/12/17 0325  Weight: 58.[name] kg (128 lb) 61.[name] kg (134 lb 11.2 oz)    HTN BP controlled   Hyperlipidemia Continue statin - LDL 187 due to noncompliance w/ meds and not failure of tx   Depression Cont Zoloft   GERD, Protonix  Hypothyroidism Continue home Synthroid  Anxiety Continue Xanax as per home dose  Hypokalemia Cont to replace and follow trend   Hypomagnesemia F/u in AM   DVT prophylaxis: lovenox  Code Status:  FULL CODE Family Communication: no family present at time of exam  Disposition Plan: transfer to tele bed - advance diet - home when intake consistent, CBG controlled, and lytes balanced   Consultants:  Cards  Antimicrobials:  none  Objective: Blood pressure 117/75, pulse 90, temperature 97.9 F (36.6 C), temperature source Oral, resp. rate 12, height 5' 0.5" ([name].537 m), weight 61.[name] kg (134 lb 11.2 oz), last menstrual period 08/11/2012, SpO2 99 %.  Intake/Output Summary (Last 24 hours) at [name]/03/2018 0908 Last data filed at [name]/03/2018 0335 Gross per 24 hour  Intake 0 ml  Output 950 ml  Net -950 ml   Filed Weights   12/11/17 0948 12/12/17 0325  Weight: 58.[name] kg (128 lb) 61.[name] kg (134 lb 11.2 oz)    Examination: General: No acute respiratory distress Lungs: Clear to auscultation bilaterally without wheezes or crackles Cardiovascular: Regular rate and rhythm without murmur gallop or rub normal S1 and S2 Abdomen: Nontender, nondistended, soft, bowel sounds positive, no rebound, no ascites, no appreciable mass Extremities: No significant cyanosis, clubbing, or edema bilateral lower extremities  CBC: Recent Labs  Lab 12/11/17 1019 12/12/17 0237  WBC 15.4* 16.0*  HGB 16.2* 13.6  HCT 46.6* 40.8  MCV 83.2 82.3  PLT 348 433   Basic Metabolic Panel: Recent Labs  Lab 12/12/17 0237 12/12/17 0752 12/12/17 1239 12/12/17 1809 12/12/17 2212  NA 137 135 137 135 135  K 2.7* 3.4* 3.[name]* 2.7* 2.8*  CL 108 105 105 97* 104  CO2 22 20* 23 20* 24  GLUCOSE 147* 161* 198* 234* 136*  BUN 12 10 5* 6 7  CREATININE 0.48 0.52 0.55 0.62 0.55  CALCIUM 8.5* 8.5* 8.8* 8.8* 8.4*  MG  --  [name].7 [name].6*  --   --    GFR: Estimated Creatinine Clearance: 66.6 mL/min (by C-G formula based on SCr of 0.55 mg/dL).  Liver Function Tests: Recent Labs  Lab 12/11/17 1036 12/12/17 1239  AST 22 30  ALT 15 16  ALKPHOS 77 68  BILITOT 0.9 0.6  PROT 7.4 6.4*  ALBUMIN 4.[name] 3.3*   Recent Labs  Lab 12/11/17 1056   LIPASE 41    Cardiac Enzymes: Recent Labs  Lab 12/12/17 1828 12/13/17 0210  TROPONINI 0.09* 0.07*    HbA1C: Hgb A1c MFr Bld  Date/Time Value Ref Range Status  12/11/2017 02:16 PM 11.[name] (H) 4.8 - 5.6 % Final    Comment:    (NOTE) Pre diabetes:          5.7%-6.4% Diabetes:              >6.4% Glycemic control for   <7.0% adults with diabetes   09/18/2017 05:33 AM 10.2 (H) 4.8 - 5.6 % Final    Comment:    (NOTE) Pre diabetes:          5.7%-6.4% Diabetes:              >6.4% Glycemic control for   <7.0% adults with diabetes     CBG: Recent Labs  Lab 12/12/17 1715 12/12/17 1923 12/12/17 2324 12/13/17 0309 12/13/17 0853  GLUCAP 236* 213* 89 96 229*    Recent Results (from the past 240 hour(s))  MRSA PCR Screening     Status: None   Collection Time: 12/11/17  6:42 PM  Result Value Ref Range Status   MRSA by PCR NEGATIVE NEGATIVE Final    Comment:        The GeneXpert MRSA Assay (FDA approved for NASAL specimens only), is one component of a comprehensive MRSA colonization surveillance program. It is not intended to diagnose MRSA infection nor to guide or monitor treatment for MRSA infections.      Scheduled Meds: . aspirin  81 mg Oral Daily  . atorvastatin  80 mg Oral q1800  . enoxaparin (LOVENOX) injection  40 mg Subcutaneous Q24H  . ezetimibe  10 mg Oral Daily  . insulin aspart  0-15 Units Subcutaneous Q4H  . levothyroxine  150 mcg Oral QAC breakfast  . losartan  25 mg Oral Daily  . metoCLOPramide (REGLAN) injection  10 mg Intravenous Q8H  . metoprolol succinate  25 mg Oral Daily  . metoprolol tartrate  5 mg Intravenous Q8H  . nitroGLYCERIN  0.4 mg Transdermal Daily  . pantoprazole (PROTONIX) IV  40 mg Intravenous Q24H  . sertraline  50 mg Oral Daily     LOS: 2 days   Cherene Altes, MD [facility] Office  [phone] Pager - Text Page per Amion as per below:  On-Call/Text Page:      [email or web address]      password [email or web address]  If 7PM-7AM,  please contact night-coverage [email or web address] Password [email or web address] [name]/03/2018, 9:08 AM

## 2017-12-13 NOTE — Evaluation (Signed)
Physical Therapy Evaluation Patient Details Name: [name] [name] MRN: [id number] DOB: 06-16-1961 Today's Date: 12/13/2017   History of Present Illness  Pt is a 57 y.o. female admitted 12/11/16 with nausea and vomiting; found to be in diabetic ketoacidosis. Peritnent PMH includes uncontrolled DMII, polysubstance abuse, GERD, HTN, HLD, cardiopathy, diabetic peripheral neuropathy, CKD III, depression.    Clinical Impression  Patient evaluated by Physical Therapy with no further acute PT needs identified. PTA, pt indep with all mobility and lives with family. Today, indep with mobility; score of 21/24 on DGI indicates decreased risk for falls. All education has been completed and the patient has no further questions. Encouraged to continue ambulating during hospital stay with assist from nursing staff for any lines/leads. PT is signing off. Thank you for this referral.    Follow Up Recommendations No PT follow up    Equipment Recommendations  None recommended by PT    Recommendations for Other Services       Precautions / Restrictions Precautions Precautions: None Restrictions Weight Bearing Restrictions: No      Mobility  Bed Mobility Overal bed mobility: Independent                Transfers Overall transfer level: Independent                  Ambulation/Gait Ambulation/Gait assistance: Independent Ambulation Distance (Feet): 500 Feet            Stairs Stairs: Yes Stairs assistance: Modified independent (Device/Increase time) Stair Management: One rail Right;Alternating pattern;Forwards Number of Stairs: 10 General stair comments: Ascend/descended steps mod indep with use of rail  Wheelchair Mobility    Modified Rankin (Stroke Patients Only)       Balance Overall balance assessment: Independent   Sitting balance-Leahy Scale: Good       Standing balance-Leahy Scale: Good                   Standardized Balance Assessment Standardized  Balance Assessment : Dynamic Gait Index   Dynamic Gait Index Level Surface: Normal Change in Gait Speed: Mild Impairment Gait with Horizontal Head Turns: Normal Gait with Vertical Head Turns: Normal Gait and Pivot Turn: Normal Step Over Obstacle: Mild Impairment Step Around Obstacles: Normal Steps: Mild Impairment Total Score: 21       Pertinent Vitals/Pain Pain Assessment: Faces Faces Pain Scale: Hurts a little bit Pain Location: Headache Pain Descriptors / Indicators: Headache Pain Intervention(s): Monitored during session    Home Living Family/patient expects to be discharged to:: Private residence Living Arrangements: Spouse/significant other;Other relatives Available Help at Discharge: Family;Available 24 hours/day Type of Home: House Home Access: Level entry     Home Layout: One level Home Equipment: None      Prior Function Level of Independence: Independent         Comments: Drives; does not work     Journalist, newspaper        Extremity/Trunk Assessment   Upper Extremity Assessment Upper Extremity Assessment: Overall WFL for tasks assessed    Lower Extremity Assessment Lower Extremity Assessment: Overall WFL for tasks assessed       Communication   Communication: No difficulties  Cognition Arousal/Alertness: Awake/alert Behavior During Therapy: WFL for tasks assessed/performed Overall Cognitive Status: Within Functional Limits for tasks assessed  General Comments      Exercises     Assessment/Plan    PT Assessment Patent does not need any further PT services  PT Problem List         PT Treatment Interventions      PT Goals (Current goals can be found in the Care Plan section)  Acute Rehab PT Goals PT Goal Formulation: All assessment and education complete, DC therapy    Frequency     Barriers to discharge        Co-evaluation               AM-PAC PT "6 Clicks"  Daily Activity  Outcome Measure Difficulty turning over in bed (including adjusting bedclothes, sheets and blankets)?: None Difficulty moving from lying on back to sitting on the side of the bed? : None Difficulty sitting down on and standing up from a chair with arms (e.g., wheelchair, bedside commode, etc,.)?: None Help needed moving to and from a bed to chair (including a wheelchair)?: None Help needed walking in hospital room?: None Help needed climbing 3-5 steps with a railing? : None 6 Click Score: 24    End of Session Equipment Utilized During Treatment: Gait belt Activity Tolerance: Patient tolerated treatment well Patient left: in chair;with call bell/phone within reach Nurse Communication: Mobility status PT Visit Diagnosis: Other abnormalities of gait and mobility (R26.89)    Time: 0923-3007 PT Time Calculation (min) (ACUTE ONLY): 19 min   Charges:   PT Evaluation $PT Eval Low Complexity: 1 Low     PT G Codes:       Mabeline Caras, PT, DPT Acute Rehab Services  Pager: [place] 12/13/2017, 12:17 PM

## 2017-12-14 DIAGNOSIS — E1110 Type 2 diabetes mellitus with ketoacidosis without coma: Principal | ICD-10-CM

## 2017-12-14 LAB — CBC
HCT: 41.8 % (ref 36.0–46.0)
Hemoglobin: 13.7 g/dL (ref 12.0–15.0)
MCH: 27.9 pg (ref 26.0–34.0)
MCHC: 32.8 g/dL (ref 30.0–36.0)
MCV: 85.1 fL (ref 78.0–100.0)
PLATELETS: 299 10*3/uL (ref 150–400)
RBC: 4.91 MIL/uL (ref 3.87–5.11)
RDW: 14.3 % (ref 11.5–15.5)
WBC: 11.9 10*3/uL — ABNORMAL HIGH (ref 4.0–10.5)

## 2017-12-14 LAB — COMPREHENSIVE METABOLIC PANEL
ALBUMIN: 3.3 g/dL — AB (ref 3.5–5.0)
ALK PHOS: 59 U/L (ref 38–126)
ALT: 15 U/L (ref 14–54)
AST: 16 U/L (ref 15–41)
Anion gap: 8 (ref 5–15)
BILIRUBIN TOTAL: 0.7 mg/dL (ref 0.3–1.2)
BUN: 10 mg/dL (ref 6–20)
CALCIUM: 8.8 mg/dL — AB (ref 8.9–10.3)
CO2: 23 mmol/L (ref 22–32)
CREATININE: 0.72 mg/dL (ref 0.44–1.00)
Chloride: 104 mmol/L (ref 101–111)
GFR calc Af Amer: >60 mL/min (ref >60)
GFR calc non Af Amer: >60 mL/min (ref >60)
GLUCOSE: 208 mg/dL — AB (ref 65–99)
Potassium: 4 mmol/L (ref 3.5–5.1)
SODIUM: 135 mmol/L (ref 135–145)
Total Protein: 6 g/dL — ABNORMAL LOW (ref 6.5–8.1)

## 2017-12-14 LAB — MAGNESIUM: Magnesium: 1.9 mg/dL (ref 1.7–2.4)

## 2017-12-14 LAB — GLUCOSE, CAPILLARY: Glucose-Capillary: 192 mg/dL — ABNORMAL HIGH (ref 65–99)

## 2017-12-14 MED ORDER — PANTOPRAZOLE SODIUM 40 MG PO TBEC: 40.0000 mg | DELAYED_RELEASE_TABLET | Freq: Every day | ORAL | 0 refills | Status: DC

## 2017-12-14 MED ORDER — NITROGLYCERIN 0.2 MG/HR TD PT24: 0.2000 mg | MEDICATED_PATCH | Freq: Every day | TRANSDERMAL | 12 refills | Status: DC | PRN

## 2017-12-14 NOTE — Discharge Summary (Signed)
Physician Discharge Summary  [name] [name] VZD:[id number] DOB: 1961-07-06 DOA: 12/11/2017  PCP: Jinny Sanders, MD  Admit date: 12/11/2017 Discharge date: 12/14/2017  Admitted From: Home  Disposition:  Home   Recommendations for Outpatient Follow-up:  1. Follow up with PCP in 1-2 weeks 2. Please obtain BMP/CBC in one week 3. Needs further counseling regarding complaint with medications.  4. Needs to follow up with cardiology      Discharge Condition: stable.  CODE STATUS: full code.  Diet recommendation: Carb Modified  Brief/Interim Summary: 89yoF w/ a HxAnxiety,Depression,Polysubstance abuse,GERD, HLD,Hypothyroidism,HTN, IdiopathicCardiomyopathy, uncontrolled DM2 (multiple admissions for DKA),diabetic peripheral neuropathy, Nephrolithiasis, and CKD stage II who presented w/ N/V/D and was found to be in DKA.  She admitted to not having taking her insulin as scheduled, with last insulin about 1 week prior to this admit.  Significant Events: 1/2 admit   Subjective: Reports that her N/V have markedly improved.  Denies current cp, sob, n/v, or abdom pain.  Feels the nitro patch has resolved her chest pain and asks that it be continued.    Assessment & Plan:  DKA - uncontrolled DM2 - noncompliance  1/2 A1c 11.1 - admitted October 2018 secondary to noncompliance with diabetic medication. -discharge on lantus and SSI coverage   Diabetic Gastroparesis /Intractable nausea and vomiting Cont  Reglan.  Tolerating diet   DiabeticPeripheral neuropathy Continue Neurontin- well controlled at this time   ChronicSystolic CHF EF 29-51% on TTE 09/20/2017 - normal coronaries on cath March 2018 - cont BB and ARB - no clinical evidence of volume overload at this time  No further evaluation per cardiology  Chronic chest pain. She report resolution of pain with nitroglycerin patch. Dose was decreased. She has been tolerating patch during admission. Will provide prescription.  Close follow up with cardiology   HTN BP controlled   Hyperlipidemia Continue statin - LDL 187 due to noncompliance w/ meds and not failure of tx   Depression Cont Zoloft   GERD, Protonix  Hypothyroidism Continue home Synthroid  Anxiety Continue Xanax as per home dose  Hypokalemia Resolved.   Hypomagnesemia Resolved.       Discharge Diagnoses:  Active Problems:   Hypothyroidism   B12 deficiency   Hyperlipidemia   Generalized anxiety disorder   Peripheral neuropathy ([name])   GERD   Irritable bowel syndrome   Essential hypertension   Osteoporosis   Vitamin D deficiency   Kidney disease, chronic, stage 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)   Gastroparesis due to DM ([name])   Nonischemic cardiomyopathy ([place])   DKA, type 1 ([place])   Migraines   DKA (diabetic ketoacidoses) ([place])    Discharge Instructions  Discharge Instructions    Diet Carb Modified   Complete by:  As directed    Increase activity slowly   Complete by:  As directed      Allergies as of 12/14/2017      Reactions   Codeine Hives, Anxiety, Other (See Comments)      Medication List    TAKE these medications   aspirin 81 MG chewable tablet Chew 1 tablet (81 mg total) by mouth daily.   atorvastatin 80 MG tablet Commonly known as:  LIPITOR Take 1 tablet (80 mg total) by mouth daily at 6 PM.   BASAGLAR KWIKPEN 100 UNIT/ML Sopn Inject 0.18 mLs (18 Units total) into the skin at bedtime.   cetirizine 10 MG tablet Commonly known as:  ZYRTEC TAKE 1 TABLET BY MOUTH EVERY DAY   ezetimibe 10 MG  tablet Commonly known as:  ZETIA Take 1 tablet (10 mg total) by mouth daily.   FREESTYLE LIBRE READER Devi 1 Device by Does not apply route 3 (three) times daily.   FREESTYLE LIBRE SENSOR SYSTEM Misc 1 Device by Does not apply route every 30 (thirty) days.   insulin lispro 100 UNIT/ML KiwkPen Commonly known as:  HUMALOG KWIKPEN Inject into skin 3 times a day at meal time according to the following  sliding scale: CBG 70-120 - 0 units / CBG 121-150 - 1 unit / CBG 151-200 - 2 units / CBG 201-250 - 3 units / CBG 251-300 - 5 units / CBG 301-350 - 7 units / CBG 351-400+ - 9 units  YOU MUST FOLLOW A STRICT DIABETIC DIET   levothyroxine 150 MCG tablet Commonly known as:  SYNTHROID, LEVOTHROID Take 1 tablet (150 mcg total) by mouth daily.   losartan 25 MG tablet Commonly known as:  COZAAR Take 25 mg by mouth daily.   metoCLOPramide 5 MG tablet Commonly known as:  REGLAN TAKE 1 TABLET BY MOUTH 4 TIMES A DAY BEFORE MEALS AND AT BEDTIME   metoprolol succinate 25 MG 24 hr tablet Commonly known as:  TOPROL-XL Take 25 mg by mouth daily.   nitroGLYCERIN 0.2 mg/hr patch Commonly known as:  NITRODUR - Dosed in mg/24 hr Place 1 patch (0.2 mg total) onto the skin daily as needed.   ondansetron 4 MG tablet Commonly known as:  ZOFRAN TAKE 1 TABLET BY MOUTH EVERY 6 HOURS AS NEEDED FOR NAUSEA AND VOMITING   ONETOUCH VERIO test strip Generic drug:  glucose blood USE TO CHECK BLOOD SUGAR DAILY AS NEEDED. DX: E11.10   pantoprazole 40 MG tablet Commonly known as:  PROTONIX Take 1 tablet (40 mg total) by mouth daily at 12 noon.   RELPAX 40 MG tablet Generic drug:  eletriptan ONE TABLET BY MOUTH AT ONSET OF HEADACHE. MAY REPEAT IN 2 HOURS IF HEADACHE PERSISTS OR RECURS.   sertraline 50 MG tablet Commonly known as:  ZOLOFT Take 1 tablet (50 mg total) by mouth daily.      Follow-up Information    Bedsole, Amy E, MD Follow up in 1 week(s).   Specialty:  Family Medicine Contact information: [place] [address] [phone]        Josue Hector, MD Follow up in 1 week(s).   Specialty:  Cardiology Contact information: [address] [place] [address] [phone]          Allergies  Allergen Reactions  . Codeine Hives, Anxiety and Other (See Comments)    Consultations:  Cardiology    Procedures/Studies: Dg Chest 2  View  Result Date: 12/11/2017 CLINICAL DATA:  Chest pain and shortness of breath beginning yesterday. EXAM: CHEST  2 VIEW COMPARISON:  09/20/2017 FINDINGS: Heart size is normal. Mediastinal shadows are normal. The lungs are clear. No bronchial thickening. No infiltrate, mass, effusion or collapse. Pulmonary vascularity is normal. No bony abnormality. IMPRESSION: Normal chest Electronically Signed   By: Nelson Chimes M.D.   On: 12/11/2017 11:02    Subjective: Feeling well denies chest pain  Discharge Exam: Vitals:   12/14/17 0525 12/14/17 1000  BP: 113/61   Pulse: 98   Resp: 16   Temp: 98.3 F (36.8 C)   SpO2: 98% 97%   Vitals:   12/13/17 2217 12/14/17 0500 12/14/17 0525 12/14/17 1000  BP: 106/67  113/61   Pulse: 90  98  Resp: 18  16   Temp: 98.3 F (36.8 C)  98.3 F (36.8 C)   TempSrc: Oral  Oral   SpO2: 98%  98% 97%  Weight:  59.3 kg (130 lb 11.7 oz)    Height:        General: Pt is alert, awake, not in acute distress Cardiovascular: RRR, S1/S2 +, no rubs, no gallops Respiratory: CTA bilaterally, no wheezing, no rhonchi Abdominal: Soft, NT, ND, bowel sounds + Extremities: no edema, no cyanosis    The results of significant diagnostics from this hospitalization (including imaging, microbiology, ancillary and laboratory) are listed below for reference.     Microbiology: Recent Results (from the past 240 hour(s))  MRSA PCR Screening     Status: None   Collection Time: 12/11/17  6:42 PM  Result Value Ref Range Status   MRSA by PCR NEGATIVE NEGATIVE Final    Comment:        The GeneXpert MRSA Assay (FDA approved for NASAL specimens only), is one component of a comprehensive MRSA colonization surveillance program. It is not intended to diagnose MRSA infection nor to guide or monitor treatment for MRSA infections.      Labs: BNP (last 3 results) No results for input(s): BNP in the last 8760 hours. Basic Metabolic Panel: Recent Labs  Lab 12/12/17 0752  12/12/17 1239 12/12/17 1809 12/12/17 2212 12/13/17 0834 12/13/17 1559 12/14/17 0233  NA 135 137 135 135  --  134* 135  K 3.4* 3.1* 2.7* 2.8*  --  3.9 4.0  CL 105 105 97* 104  --  103 104  CO2 20* 23 20* 24  --  22 23  GLUCOSE 161* 198* 234* 136*  --  149* 208*  BUN 10 5* 6 7  --  7 10  CREATININE 0.52 0.55 0.62 0.55  --  0.54 0.72  CALCIUM 8.5* 8.8* 8.8* 8.4*  --  8.7* 8.8*  MG 1.7 1.6*  --   --  1.9  --  1.9   Liver Function Tests: Recent Labs  Lab 12/11/17 1036 12/12/17 1239 12/14/17 0233  AST 22 30 16   ALT 15 16 15   ALKPHOS 77 68 59  BILITOT 0.9 0.6 0.7  PROT 7.4 6.4* 6.0*  ALBUMIN 4.1 3.3* 3.3*   Recent Labs  Lab 12/11/17 1056  LIPASE 41   No results for input(s): AMMONIA in the last 168 hours. CBC: Recent Labs  Lab 12/11/17 1019 12/12/17 0237 12/14/17 0233  WBC 15.4* 16.0* 11.9*  HGB 16.2* 13.6 13.7  HCT 46.6* 40.8 41.8  MCV 83.2 82.3 85.1  PLT 348 310 299   Cardiac Enzymes: Recent Labs  Lab 12/12/17 1828 12/13/17 0210 12/13/17 0834  TROPONINI 0.09* 0.07* 0.04*   BNP: Invalid input(s): POCBNP CBG: Recent Labs  Lab 12/13/17 1210 12/13/17 1558 12/13/17 2218 12/13/17 2332 12/14/17 0806  GLUCAP 87 147* 165* 158* 192*   D-Dimer No results for input(s): DDIMER in the last 72 hours. Hgb A1c Recent Labs    12/11/17 1416  HGBA1C 11.1*   Lipid Profile Recent Labs    12/12/17 2000  CHOL 256*  HDL 55  LDLCALC 187*  TRIG 71  CHOLHDL 4.7   Thyroid function studies Recent Labs    12/11/17 1515  TSH 0.016*   Anemia work up No results for input(s): VITAMINB12, FOLATE, FERRITIN, TIBC, IRON, RETICCTPCT in the last 72 hours. Urinalysis    Component Value Date/Time   COLORURINE YELLOW 12/11/2017 1105   APPEARANCEUR HAZY (A) 12/11/2017  1105   LABSPEC 1.010 12/11/2017 1105   PHURINE 5.5 12/11/2017 1105   GLUCOSEU >=500 (A) 12/11/2017 1105   HGBUR TRACE (A) 12/11/2017 1105   BILIRUBINUR NEGATIVE 12/11/2017 1105   BILIRUBINUR  negative 09/11/2016 1038   KETONESUR 40 (A) 12/11/2017 1105   PROTEINUR NEGATIVE 12/11/2017 1105   UROBILINOGEN 0.2 09/11/2016 1038   UROBILINOGEN 0.2 10/28/2013 1723   NITRITE NEGATIVE 12/11/2017 1105   LEUKOCYTESUR NEGATIVE 12/11/2017 1105   Sepsis Labs Invalid input(s): PROCALCITONIN,  WBC,  LACTICIDVEN Microbiology Recent Results (from the past 240 hour(s))  MRSA PCR Screening     Status: None   Collection Time: 12/11/17  6:42 PM  Result Value Ref Range Status   MRSA by PCR NEGATIVE NEGATIVE Final    Comment:        The GeneXpert MRSA Assay (FDA approved for NASAL specimens only), is one component of a comprehensive MRSA colonization surveillance program. It is not intended to diagnose MRSA infection nor to guide or monitor treatment for MRSA infections.      Time coordinating discharge: Over 30 minutes  SIGNED:   Elmarie Shiley, MD  [facility] 12/14/2017, 11:25 AM Pager   If 7PM-7AM, please contact night-coverage [email or web address] Password [email or web address]

## 2017-12-14 NOTE — Progress Notes (Signed)
OT Cancellation Note  Patient Details Name: [name] [name] MRN: [id number] DOB: 1961-07-19   Cancelled Treatment:    Reason Eval/Treat Not Completed: OT screened, no needs identified, will sign off. Discussed with PT. Pt currently able to complete ADL at PLOF and no OT needs identified. Will sign off.   Norman Herrlich, MS OTR/L  Pager: [place] A Natalynn Pedone 12/14/2017, 1:16 PM

## 2017-12-14 NOTE — Consult Note (Signed)
Palliative care progress note  Reason for consult: Goals of care in light of poorly controlled diabetes with current hospitalization  Met today with [name] [name].  I introduced palliative care as specialized medical care for people living with serious illness. It focuses on providing relief from the symptoms and stress of a serious illness. The goal is to improve quality of life for both the patient and the family.  Reports that the most important thing to her is her family.  She states that there has been a lot of stress in her family as her daughter is currently "legal trouble."  Much of her conversation centered around her ability to cope with outside stress and how this affects her daily management for her chronic diseases.  We discussed clinical course as well as wishes moving forward in regard to advanced directives.  Concepts specific to code status and rehospitalization discussed. Values and goals of care important to patient and family were attempted to be elicited.  1.  Full code.  Full scope treatment. 2.  Her husband is her surrogate decision maker if she can not make her own decisions. 3.  She understands that she has multiple comorbid conditions that will continue to progress with time.  Reports that she and her husband have discussed this.  Her goal is to continue with aggressive medical care as long as there is a possibility for getting well enough to be back home and spend time with her family.  If, however, there were to come a point where it appears that she is not going to recover, she would not want to be kept alive artificially "just for the sake of being kept alive."  In talking with her today, it appears that her limit would be that she would not want a trach or PEG if she were ever to be intubated and unable to be weaned.  She will continue to discuss this with her husband in order to make sure that he is clear on her wishes.  Questions and concerns addressed.     Left a copy  of my card and she will call if there are further areas which we can be of assistance in her care.  Discussed with Dr. Thereasa Solo.  Total time: 80 minutes Greater than 50%  of this time was spent counseling and coordinating care related to the above assessment and plan.  Micheline Rough, MD [facility] [phone]

## 2017-12-16 ENCOUNTER — Other Ambulatory Visit: Payer: Self-pay | Admitting: *Deleted

## 2017-12-16 MED ORDER — SERTRALINE HCL 50 MG PO TABS: 50.0000 mg | ORAL_TABLET | Freq: Every day | ORAL | 1 refills | Status: DC

## 2017-12-17 ENCOUNTER — Telehealth: Payer: Self-pay

## 2017-12-17 NOTE — Telephone Encounter (Signed)
Transition Care Management Follow-up Telephone Call   Date discharged? 12/14/17   How have you been since you were released from the hospital? Patient states she has been doing okay and just "taking it slow".    Do you understand why you were in the hospital? Yes   Do you understand the discharge instructions? yes   Where were you discharged to? Home but told the hospital that I didn't need any home health or therapy.   Items Reviewed:  Medications reviewed: Yes  Allergies reviewed: Yes  Dietary changes reviewed: Yes Referrals reviewed:  Yes, sees cardiology on 12/19/17 with Dr. Johnsie Cancel.  Functional Questionnaire:   Activities of Daily Living (ADLs):   She states they are independent in the following: Patient is independent with all ADL's. States they require assistance with the following: Patient does not require any assistance   Any transportation issues/concerns?: No, patient is able to driver herself if she needs to but is limiting driving due to feeling weak.     Any patient concerns? No   Confirmed importance and date/time of follow-up visits scheduled:  Yes  Provider Appointment booked with Dr. Diona Browner for 12/20/17 at 4pm.   Confirmed with patient if condition begins to worsen call PCP or go to the ER.  Patient was given the office number and encouraged to call back with question or concerns.  : Yes.

## 2017-12-19 ENCOUNTER — Ambulatory Visit: Payer: 59 | Admitting: Cardiovascular Disease

## 2017-12-20 ENCOUNTER — Encounter: Payer: Self-pay | Admitting: Family Medicine

## 2017-12-20 ENCOUNTER — Ambulatory Visit: Payer: 59 | Admitting: Family Medicine

## 2017-12-20 ENCOUNTER — Other Ambulatory Visit: Payer: Self-pay

## 2017-12-20 VITALS — BP 90/60 | HR 90 | Temp 98.5°F | Ht 61.0 in | Wt 138.2 lb

## 2017-12-20 DIAGNOSIS — E1143 Type 2 diabetes mellitus with diabetic autonomic (poly)neuropathy: Secondary | ICD-10-CM | POA: Diagnosis not present

## 2017-12-20 DIAGNOSIS — Z794 Long term (current) use of insulin: Secondary | ICD-10-CM | POA: Diagnosis not present

## 2017-12-20 DIAGNOSIS — Z9119 Patient's noncompliance with other medical treatment and regimen: Secondary | ICD-10-CM

## 2017-12-20 DIAGNOSIS — I5022 Chronic systolic (congestive) heart failure: Secondary | ICD-10-CM | POA: Diagnosis not present

## 2017-12-20 DIAGNOSIS — E876 Hypokalemia: Secondary | ICD-10-CM

## 2017-12-20 DIAGNOSIS — K3184 Gastroparesis: Secondary | ICD-10-CM | POA: Diagnosis not present

## 2017-12-20 DIAGNOSIS — Z91199 Patient's noncompliance with other medical treatment and regimen due to unspecified reason: Secondary | ICD-10-CM

## 2017-12-20 DIAGNOSIS — J069 Acute upper respiratory infection, unspecified: Secondary | ICD-10-CM

## 2017-12-20 DIAGNOSIS — I4891 Unspecified atrial fibrillation: Secondary | ICD-10-CM

## 2017-12-20 DIAGNOSIS — E1110 Type 2 diabetes mellitus with ketoacidosis without coma: Secondary | ICD-10-CM

## 2017-12-20 DIAGNOSIS — B9789 Other viral agents as the cause of diseases classified elsewhere: Secondary | ICD-10-CM | POA: Diagnosis not present

## 2017-12-20 LAB — BASIC METABOLIC PANEL
BUN: 18 mg/dL (ref 7–25)
CHLORIDE: 98 mmol/L (ref 98–110)
CO2: 30 mmol/L (ref 20–32)
CREATININE: 0.66 mg/dL (ref 0.50–1.05)
Calcium: 9.4 mg/dL (ref 8.6–10.4)
Glucose, Bld: 249 mg/dL — ABNORMAL HIGH (ref 65–99)
POTASSIUM: 4.1 mmol/L (ref 3.5–5.3)
Sodium: 136 mmol/L (ref 135–146)

## 2017-12-20 LAB — MAGNESIUM: MAGNESIUM: 1.9 mg/dL (ref 1.5–2.5)

## 2017-12-20 MED ORDER — ONDANSETRON HCL 4 MG PO TABS: ORAL_TABLET | ORAL | 0 refills | Status: DC

## 2017-12-20 NOTE — Patient Instructions (Addendum)
Increase Basalgar to 20 Units daily, [place] fasting blood sugar in < 120.  Bring in measurements  Continue sliding scale.  Avoid soda and sweets.  Start mucinex  Dm twice daily for cough and congestion.  Can also start flonase 2 sprays pre nostril daily.

## 2017-12-20 NOTE — Progress Notes (Signed)
Subjective:    Patient ID: [name] [name], female    DOB: Aug 28, 1961, 57 y.o.   MRN: [id number]  HPI    57 year old female pt presents following hospitalization after she stopped taking her medication. Admitted 12/11/2017 to 12/14/2017   Hospital summary as follows: DKA- uncontrolled DM2 - noncompliance 1/2 A1c 11.1-admitted October 2018 secondary to noncompliance with diabetic medication. -discharge on lantus and SSI coverage   Diabetic Gastroparesis /Intractable nausea and vomiting Cont Reglan.  Tolerating diet   DiabeticPeripheral neuropathy Continue Neurontin- well controlled at this time  ChronicSystolic CHF EF 50-09% [id number] - normal coronaries on cath March 2018 - cont BB and ARB - no clinical evidence of volume overload at this time No further evaluation per cardiology  Chronic chest pain. She report resolution of pain with nitroglycerin patch. Dose was decreased. She has been tolerating patch during admission. Will provide prescription. Close follow up with cardiology       HTN BP controlled  Hyperlipidemia Continue statin- LDL 187 due to noncompliance w/ meds and not failure of tx  Depression ContZoloft   GERD, Protonix  Hypothyroidism Continue home Synthroid  Anxiety Continue Xanaxas per home dose  Hypokalemia Resolved.   Hypomagnesemia Resolved.    Recommendations for Outpatient Follow-up:  1. Follow up with PCP in 1-2 weeks 2. Please obtain BMP/CBC in one week 3. Needs further counseling regarding complaint with medications.  4. Needs to follow up with cardiology    Today she reports she is back on her medication.  She has had some improvement in chest pain but always has chronic chest pain and nausea.   She is on Basalgar 18 units daily  Using SSI.Marland Kitchen  2-8 units Humalog before meals. FBS  170. Before meals 196-220.  She has been discharged by her endocrinologist for no shows.   She has been having  some sneezing and congestion this AM.. No fever.  ear full, no pain. Coughing some.  Social History /Family History/Past Medical History reviewed in detail and updated in EMR if needed. Blood pressure 90/60, pulse 90, temperature 98.5 F (36.9 C), temperature source Oral, height 5\' 1"  (1.549 m), weight 138 lb 4 oz (62.7 kg), last menstrual period 08/11/2012.  Review of Systems  Constitutional: Negative for fatigue and fever.  HENT: Positive for congestion, postnasal drip and rhinorrhea. Negative for ear pain.   Eyes: Negative for pain.  Respiratory: Negative for cough and shortness of breath.   Cardiovascular: Negative for chest pain, palpitations and leg swelling.  Gastrointestinal: Negative for abdominal pain.  Genitourinary: Negative for dysuria and vaginal bleeding.  Musculoskeletal: Negative for back pain.  Neurological: Negative for syncope, light-headedness and headaches.  Psychiatric/Behavioral: Negative for dysphoric mood.       Objective:   Physical Exam  Constitutional: Vital signs are normal. She appears well-developed and well-nourished. She is cooperative.  Non-toxic appearance. She does not appear ill. No distress.  HENT:  Head: Normocephalic.  Right Ear: Hearing, tympanic membrane, external ear and ear canal normal. Tympanic membrane is not erythematous, not retracted and not bulging.  Left Ear: Hearing, tympanic membrane, external ear and ear canal normal. Tympanic membrane is not erythematous, not retracted and not bulging.  Nose: Mucosal edema and rhinorrhea present. Right sinus exhibits no maxillary sinus tenderness and no frontal sinus tenderness. Left sinus exhibits no maxillary sinus tenderness and no frontal sinus tenderness.  Mouth/Throat: Uvula is midline, oropharynx is clear and moist and mucous membranes are normal.  Eyes: Conjunctivae, EOM and  lids are normal. Pupils are equal, round, and reactive to light. Lids are everted and swept, no foreign bodies  found.  Neck: Trachea normal and normal range of motion. Neck supple. Carotid bruit is not present. No thyroid mass and no thyromegaly present.  Cardiovascular: Normal rate, regular rhythm, S1 normal, S2 normal, normal heart sounds, intact distal pulses and normal pulses. Exam reveals no gallop and no friction rub.  No murmur heard. Pulmonary/Chest: Effort normal and breath sounds normal. No tachypnea. No respiratory distress. She has no decreased breath sounds. She has no wheezes. She has no rhonchi. She has no rales.  Neurological: She is alert.  Skin: Skin is warm, dry and intact. No rash noted.  Psychiatric: Her speech is normal and behavior is normal. Judgment normal. Her mood appears not anxious. Cognition and memory are normal. She does not exhibit a depressed mood.          Assessment & Plan:

## 2017-12-23 ENCOUNTER — Other Ambulatory Visit: Payer: Self-pay | Admitting: *Deleted

## 2017-12-23 MED ORDER — ONETOUCH ULTRASOFT LANCETS MISC: 3 refills | Status: DC

## 2017-12-23 MED ORDER — METOCLOPRAMIDE HCL 5 MG PO TABS: ORAL_TABLET | ORAL | 0 refills | Status: DC

## 2017-12-23 MED ORDER — PEN NEEDLES 31G X 8 MM MISC: 11 refills | Status: DC

## 2017-12-23 NOTE — Telephone Encounter (Signed)
Last office visit 12/20/2017.  Last refilled 11/29/2017 for #120 with no refills.  Pharmacy is requesting 90 day supply #360.  Ok to refill for 90 day supply?

## 2017-12-25 NOTE — Progress Notes (Deleted)
Cardiology Office Note   Date:  12/25/2017   ID:  [name] [name], DOB 08-Dec-1961, MRN [id number]  PCP:  Jinny Sanders, MD  Cardiologist:   Minus Breeding, MD Referring:  ***  No chief complaint on file.     History of Present Illness: [name] [name] is a 57 y.o. female who presents for follow up of atrial fib.  I saw her last in 2016 for this.  She had atrial fib in 2012.  She had an abnormal stress test with normal coronaries on cath.  EF is 55%.  She was having problems with her thyroid at that point time.  When I saw her last she was having chest discomfort. I sent her for a POET (Plain Old Exercise Treadmill) .  This was negative for ischemia.  I did send her for an event monitor to make sure she was not having any recurrent fibrillation.  There was never any evidence of fibrillation.  She was taken off of anticoagulation as it was felt that her atrial fibrillation was related to her thyroid problems at the time.  These had since been corrected.  In 2018 she was found to have an elevated troponin and an EF of 35-40%.  Cardiac cath demonstrated normal coronaries however.  When we saw her most recently in consultation when she was in the hospital in Jan with poorly controlled blood sugars there was a question of her compliance with her ace inhibitors and her beta-blockers.  She was managed medically.  She also had some chronic atypical chest pain.  I have reviewed extensive hospital records for this visit.  ***     Past Medical History:  Diagnosis Date  . Anxiety   . B12 deficiency   . Chest pain 04/05/2017  . GERD (gastroesophageal reflux disease)   . Hyperlipidemia   . Hypertension    [name]  . Hypothyroidism   . IBS (irritable bowel syndrome)   . Intermittent vertigo   . 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 Leg cramping    "@ night" (09/18/2013)  . Migraine    "once q couple months" (09/18/2013)  . Osteoporosis   . Peripheral neuropathy   .  Type II diabetes mellitus ([place])   . Umbilical hernia    unrepaired (09/18/2013)    Past Surgical History:  Procedure Laterality Date  . CESAREAN SECTION  1989  . LAPAROSCOPIC ASSISTED VAGINAL HYSTERECTOMY  09/23/2012   Procedure: LAPAROSCOPIC ASSISTED VAGINAL HYSTERECTOMY;  Surgeon: Linda Hedges, DO;  Location: [facility];  Service: Gynecology;  Laterality: N/A;  pull Dr Gregor Hams instrument  . LEFT HEART CATH AND CORONARY ANGIOGRAPHY N/A 02/11/2017   Procedure: Left Heart Cath and Coronary Angiography;  Surgeon: Lorretta Harp, MD;  Location: [facility];  Service: Cardiovascular;  Laterality: N/A;  . TUBAL LIGATION Bilateral 1992  . VAGINAL HYSTERECTOMY  2013     Current Outpatient Medications  Medication Sig Dispense Refill  . aspirin 81 MG chewable tablet Chew 1 tablet (81 mg total) by mouth daily. 30 tablet 0  . atorvastatin (LIPITOR) 80 MG tablet Take 1 tablet (80 mg total) by mouth daily at 6 PM. 90 tablet 0  . ezetimibe (ZETIA) 10 MG tablet Take 1 tablet (10 mg total) by mouth daily. 30 tablet 11  . Insulin Glargine (BASAGLAR KWIKPEN [place]) Inject 22 Units into the skin at bedtime.    . insulin lispro (HUMALOG KWIKPEN) 100 UNIT/ML KiwkPen Inject into skin  3 times a day at meal time according to the following sliding scale: CBG 70-120 - 0 units / CBG 121-150 - 1 unit / CBG 151-200 - 2 units / CBG 201-250 - 3 units / CBG 251-300 - 5 units / CBG 301-350 - 7 units / CBG 351-400+ - 9 units  YOU MUST FOLLOW A STRICT DIABETIC DIET 15 mL 0  . Insulin Pen Needle (PEN NEEDLES) 31G X 8 MM MISC Use to inject insulin 4 times a day.  Dx: E11.10 130 each 11  . Lancets (ONETOUCH ULTRASOFT) lancets Use to check blood sugar daily as needed.  Dx: E11.10 100 each 3  . levothyroxine (SYNTHROID, LEVOTHROID) 150 MCG tablet Take 1 tablet (150 mcg total) by mouth daily. 90 tablet 3  . losartan (COZAAR) 25 MG tablet Take 25 mg by mouth daily.  12  . metoCLOPramide (REGLAN) 5 MG tablet TAKE 1 TABLET  BY MOUTH 4 TIMES A DAY BEFORE MEALS AND AT BEDTIME 30 tablet 0  . metoprolol succinate (TOPROL-XL) 25 MG 24 hr tablet Take 25 mg by mouth daily.  12  . nitroGLYCERIN (NITRODUR - DOSED IN MG/24 HR) 0.2 mg/hr patch Place 1 patch (0.2 mg total) onto the skin daily as needed. 10 patch 12  . ondansetron (ZOFRAN) 4 MG tablet TAKE 1 TABLET BY MOUTH EVERY 6 HOURS AS NEEDED FOR NAUSEA AND VOMITING 30 tablet 0  . ONETOUCH VERIO test strip USE TO CHECK BLOOD SUGAR DAILY AS NEEDED. DX: E11.10 100 each 3  . pantoprazole (PROTONIX) 40 MG tablet Take 1 tablet (40 mg total) by mouth daily at 12 noon. 30 tablet 0  . RELPAX 40 MG tablet ONE TABLET BY MOUTH AT ONSET OF HEADACHE. MAY REPEAT IN 2 HOURS IF HEADACHE PERSISTS OR RECURS. 10 tablet 2  . sertraline (ZOLOFT) 50 MG tablet Take 1 tablet (50 mg total) by mouth daily. 90 tablet 1   No current facility-administered medications for this visit.     Allergies:   Codeine    Social History:  The patient  reports that she quit smoking about 27 years ago. Her smoking use included cigarettes. She quit after 0.20 years of use. she has never used smokeless tobacco. She reports that she uses drugs. Drug: Marijuana. Frequency: 3.00 times per week. She reports that she does not drink alcohol.   Family History:  The patient's ***family history includes Alzheimer's disease in her maternal grandmother; Coronary artery disease in her maternal grandmother; Diabetes in her maternal grandmother and other; Heart attack in her maternal grandfather; Heart disease in her other; Heart failure in her brother and mother.    ROS:  Please see the history of present illness.   Otherwise, review of systems are positive for {NONE DEFAULTED:18576::"none"}.   All other systems are reviewed and negative.    PHYSICAL EXAM: VS:  LMP 08/11/2012  , BMI There is no height or weight on file to calculate BMI. GENERAL:  Well appearing HEENT:  Pupils equal round and reactive, fundi not visualized,  oral mucosa unremarkable NECK:  No jugular venous distention, waveform within normal limits, carotid upstroke brisk and symmetric, no bruits, no thyromegaly LYMPHATICS:  No cervical, inguinal adenopathy LUNGS:  Clear to auscultation bilaterally BACK:  No CVA tenderness CHEST:  Unremarkable HEART:  PMI not displaced or sustained,S1 and S2 within normal limits, no S3, no S4, no clicks, no rubs, *** murmurs ABD:  Flat, positive bowel sounds normal in frequency in pitch, no bruits, no rebound, no guarding,  no midline pulsatile mass, no hepatomegaly, no splenomegaly EXT:  2 plus pulses throughout, no edema, no cyanosis no clubbing SKIN:  No rashes no nodules NEURO:  Cranial nerves II through XII grossly intact, motor grossly intact throughout PSYCH:  Cognitively intact, oriented to person place and time    EKG:  EKG {ACTION; IS/IS WVP:[id number]} ordered today. The ekg ordered today demonstrates ***   Recent Labs: 12/11/2017: TSH 0.016 12/14/2017: ALT 15; Hemoglobin 13.7; Platelets 299 12/20/2017: BUN 18; Creat 0.66; Magnesium 1.9; Potassium 4.1; Sodium 136    Lipid Panel    Component Value Date/Time   CHOL 256 (H) 12/12/2017 2000   TRIG 71 12/12/2017 2000   HDL 55 12/12/2017 2000   CHOLHDL 4.7 12/12/2017 2000   VLDL 14 12/12/2017 2000   LDLCALC 187 (H) 12/12/2017 2000   LDLDIRECT 206.0 07/27/2014 0745      Wt Readings from Last 3 Encounters:  12/20/17 138 lb 4 oz (62.7 kg)  12/14/17 130 lb 11.7 oz (59.3 kg)  09/23/17 125 lb 14.4 oz (57.1 kg)      Other studies Reviewed: Additional studies/ records that were reviewed today include: ***. Review of the above records demonstrates:  Please see elsewhere in the note.  ***   ASSESSMENT AND PLAN:  *** Chronic systolic heart failure-  Nonischemic cardiomyopathy -  Noncardiac chest pain -  HTN -  Poorly controlled diabetes -    Previous history of atrial fibrillation -    Current medicines are reviewed at length with the  patient today.  The patient {ACTIONS; HAS/DOES NOT HAVE:19233} concerns regarding medicines.  The following changes have been made:  {PLAN; NO CHANGE:13088:s}  Labs/ tests ordered today include: *** No orders of the defined types were placed in this encounter.    Disposition:   FU with ***    Signed, Minus Breeding, MD  12/25/2017 12:57 PM    [facility]

## 2017-12-26 ENCOUNTER — Ambulatory Visit: Payer: 59 | Admitting: Cardiology

## 2017-12-26 ENCOUNTER — Emergency Department (HOSPITAL_COMMUNITY): Payer: 59

## 2017-12-26 ENCOUNTER — Emergency Department (HOSPITAL_COMMUNITY)
Admission: EM | Admit: 2017-12-26 | Discharge: 2017-12-27 | Disposition: A | Discharge status: Home / Self Care | Payer: 59 | Attending: Physician Assistant | Admitting: Physician Assistant

## 2017-12-26 ENCOUNTER — Encounter (HOSPITAL_COMMUNITY): Payer: Self-pay | Admitting: Emergency Medicine

## 2017-12-26 DIAGNOSIS — N182 Chronic kidney disease, stage 2 (mild): Secondary | ICD-10-CM | POA: Insufficient documentation

## 2017-12-26 DIAGNOSIS — I252 Old myocardial infarction: Secondary | ICD-10-CM | POA: Insufficient documentation

## 2017-12-26 DIAGNOSIS — E860 Dehydration: Secondary | ICD-10-CM | POA: Diagnosis not present

## 2017-12-26 DIAGNOSIS — R112 Nausea with vomiting, unspecified: Secondary | ICD-10-CM | POA: Insufficient documentation

## 2017-12-26 DIAGNOSIS — Z87891 Personal history of nicotine dependence: Secondary | ICD-10-CM | POA: Diagnosis not present

## 2017-12-26 DIAGNOSIS — E1165 Type 2 diabetes mellitus with hyperglycemia: Secondary | ICD-10-CM | POA: Insufficient documentation

## 2017-12-26 DIAGNOSIS — I129 Hypertensive chronic kidney disease with stage 1 through stage 4 chronic kidney disease, or unspecified chronic kidney disease: Secondary | ICD-10-CM | POA: Insufficient documentation

## 2017-12-26 DIAGNOSIS — R109 Unspecified abdominal pain: Secondary | ICD-10-CM | POA: Diagnosis not present

## 2017-12-26 DIAGNOSIS — Z79899 Other long term (current) drug therapy: Secondary | ICD-10-CM | POA: Diagnosis not present

## 2017-12-26 LAB — BASIC METABOLIC PANEL
Anion gap: 14 (ref 5–15)
BUN: 17 mg/dL (ref 6–20)
CHLORIDE: 101 mmol/L (ref 101–111)
CO2: 21 mmol/L — ABNORMAL LOW (ref 22–32)
Calcium: 9.5 mg/dL (ref 8.9–10.3)
Creatinine, Ser: 0.66 mg/dL (ref 0.44–1.00)
GFR calc Af Amer: >60 mL/min (ref >60)
GFR calc non Af Amer: >60 mL/min (ref >60)
GLUCOSE: 471 mg/dL — AB (ref 65–99)
Potassium: 3.4 mmol/L — ABNORMAL LOW (ref 3.5–5.1)
Sodium: 136 mmol/L (ref 135–145)

## 2017-12-26 LAB — CBC
HCT: 44.1 % (ref 36.0–46.0)
HEMOGLOBIN: 15.8 g/dL — AB (ref 12.0–15.0)
MCH: 29.5 pg (ref 26.0–34.0)
MCHC: 35.8 g/dL (ref 30.0–36.0)
MCV: 82.4 fL (ref 78.0–100.0)
PLATELETS: 389 10*3/uL (ref 150–400)
RBC: 5.35 MIL/uL — AB (ref 3.87–5.11)
RDW: 14.2 % (ref 11.5–15.5)
WBC: 17.8 10*3/uL — AB (ref 4.0–10.5)

## 2017-12-26 LAB — MAGNESIUM: Magnesium: 1.6 mg/dL — ABNORMAL LOW (ref 1.7–2.4)

## 2017-12-26 LAB — BLOOD GAS, VENOUS
ACID-BASE EXCESS: 0.8 mmol/L (ref 0.0–2.0)
BICARBONATE: 24.4 mmol/L (ref 20.0–28.0)
Drawn by: 25788
O2 Saturation: 89.2 %
PCO2 VEN: 37.5 mmHg — AB (ref 44.0–60.0)
PO2 VEN: 59.9 mmHg — AB (ref 32.0–45.0)
Patient temperature: 98.6
pH, Ven: 7.428 (ref 7.250–7.430)

## 2017-12-26 LAB — URINALYSIS, ROUTINE W REFLEX MICROSCOPIC
Bacteria, UA: NONE SEEN
Bilirubin Urine: NEGATIVE
Hgb urine dipstick: NEGATIVE
Ketones, ur: 20 mg/dL — AB
Leukocytes, UA: NEGATIVE
Nitrite: NEGATIVE
PROTEIN: 30 mg/dL — AB
Specific Gravity, Urine: 1.026 (ref 1.005–1.030)
pH: 6 (ref 5.0–8.0)

## 2017-12-26 LAB — PHOSPHORUS: Phosphorus: 3.7 mg/dL (ref 2.5–4.6)

## 2017-12-26 LAB — CBG MONITORING, ED
Glucose-Capillary: 442 mg/dL — ABNORMAL HIGH (ref 65–99)
Glucose-Capillary: 281 mg/dL — ABNORMAL HIGH (ref 65–99)

## 2017-12-26 LAB — LIPASE, BLOOD: Lipase: 23 U/L (ref 11–51)

## 2017-12-26 LAB — I-STAT CG4 LACTIC ACID, ED: Lactic Acid, Venous: 1.83 mmol/L (ref 0.5–1.9)

## 2017-12-26 MED ORDER — PROMETHAZINE HCL 25 MG/ML IJ SOLN
12.5000 mg | Freq: Once | INTRAMUSCULAR | Status: AC
  Administered 2017-12-26: 12.5 mg via INTRAVENOUS
  Filled 2017-12-26: qty 1

## 2017-12-26 MED ORDER — PROMETHAZINE HCL 25 MG RE SUPP: 25.0000 mg | Freq: Four times a day (QID) | RECTAL | 0 refills | Status: DC | PRN

## 2017-12-26 MED ORDER — SODIUM CHLORIDE 0.9 % IV BOLUS (SEPSIS)
1000.0000 mL | Freq: Once | INTRAVENOUS | Status: AC
  Administered 2017-12-26: 1000 mL via INTRAVENOUS

## 2017-12-26 MED ORDER — IOPAMIDOL (ISOVUE-300) INJECTION 61%
INTRAVENOUS | Status: AC
  Administered 2017-12-26: 100 mL
  Filled 2017-12-26: qty 100

## 2017-12-26 NOTE — ED Triage Notes (Signed)
Per [facility] patient from home for abd pain with n/v that started last night. Patient hyperglycemic with EMS CBG 395.  Patient took Zofran 4mg  on her own then EMS gave another dose via IV (but unsure if got fully due to IV infiltration).

## 2017-12-26 NOTE — Discharge Instructions (Signed)
We are happy to report that you did not have DKA today.  You did have nausea and vomiting.  As we discussed this could very much be related to cannabis use.  Please refrain from use and see how your symptoms change.  Please follow-up with your primary care physician.  Please continue to treat your sugars with insulin as prescribed.

## 2017-12-26 NOTE — ED Provider Notes (Signed)
[facility] DEPT Provider Note   CSN: [id number] Arrival date & time: 12/26/17  1447     History   Chief Complaint Chief Complaint  Patient presents with  . Emesis  . Abdominal Pain  . Hyperglycemia    HPI [name] [name] is a 57 y.o. female.  HPI   Patient is a 57 year old female presenting with nausea and abdominal pain.  Patient had symptoms that started last night.  She reports she ate dinner and then developed some vomiting.  She reports mild abdominal pain 2.  She reports having issues with DKA in the past and so she came here to be evaluated.  Patient reports being able to take p.o.  With mild nausea.   Past Medical History:  Diagnosis Date  . Anxiety   . B12 deficiency   . Chest pain 04/05/2017  . GERD (gastroesophageal reflux disease)   . Hyperlipidemia   . Hypertension    [name] [name]  . Hypothyroidism   . IBS (irritable bowel syndrome)   . Intermittent vertigo   . 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 Leg cramping    "@ night" (09/18/2013)  . Migraine    "once q couple months" (09/18/2013)  . Osteoporosis   . Peripheral neuropathy   . Type II diabetes mellitus ([name] [name])   . Umbilical hernia    unrepaired (09/18/2013)    Patient Active Problem List   Diagnosis Date Noted  . Migraines 12/11/2017  . DKA (diabetic ketoacidoses) ([place]) 12/11/2017  . Chest discomfort 09/18/2017  . DKA, type 1 ([place]) 09/17/2017  . Phrygian cap 06/20/2017  . Lower abdominal pain   . Nonischemic cardiomyopathy ([place]) 03/22/2017  . NSTEMI (non-ST elevated myocardial infarction) ([place]) 02/11/2017  . H. pylori infection 02/09/2017  . Gastroparesis due to DM ([place]) 01/22/2017  . Atrial fibrillation ([place]) 02/01/2015  . 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 Hepatic lesion 09/18/2013  . Vitamin D deficiency 05/20/2012  . Essential hypertension 02/01/2011  . Osteoporosis 02/01/2011  . B12 deficiency  04/04/2010  . Peripheral neuropathy ([place]) 05/10/2009  . GERD 04/08/2009  . Generalized anxiety disorder 06/22/2008  . Hypothyroidism 04/29/2008  . Hyperlipidemia 04/29/2008  . Irritable bowel syndrome 04/29/2008    Past Surgical History:  Procedure Laterality Date  . CESAREAN SECTION  1989  . LAPAROSCOPIC ASSISTED VAGINAL HYSTERECTOMY  09/23/2012   Procedure: LAPAROSCOPIC ASSISTED VAGINAL HYSTERECTOMY;  Surgeon: [name] [name];  Location: [facility];  Service: Gynecology;  Laterality: N/A;  pull Dr [name] [name] instrument  . LEFT HEART CATH AND CORONARY ANGIOGRAPHY N/A 02/11/2017   Procedure: Left Heart Cath and Coronary Angiography;  Surgeon: [name] Harp, MD;  Location: [facility];  Service: Cardiovascular;  Laterality: N/A;  . TUBAL LIGATION Bilateral 1992  . VAGINAL HYSTERECTOMY  2013    OB History    Gravida Para Term Preterm AB Living   2 2           SAB TAB Ectopic Multiple Live Births                   Home Medications    Prior to Admission medications   Medication Sig Start Date End Date Taking? Authorizing Provider  aspirin 81 MG chewable tablet Chew 1 tablet (81 mg total) by mouth daily. 02/12/17  Yes [name] Gravel, MD  atorvastatin (LIPITOR) 80 MG tablet Take 1 tablet (80 mg total) by mouth daily at 6 PM. 09/04/16  Yes [name], [name] E, MD  ezetimibe (ZETIA) 10 MG tablet Take 1 tablet (10 mg total) by mouth daily. 09/11/16  Yes [name], [name] E, MD  Insulin Glargine (BASAGLAR KWIKPEN [place]) Inject 22 Units into the skin at bedtime.   Yes [provider]  insulin lispro (HUMALOG KWIKPEN) 100 UNIT/ML KiwkPen Inject into skin 3 times a day at meal time according to the following sliding scale: CBG 70-120 - 0 units / CBG 121-150 - 1 unit / CBG 151-200 - 2 units / CBG 201-250 - 3 units / CBG 251-300 - 5 units / CBG 301-350 - 7 units / CBG 351-400+ - 9 units  YOU MUST FOLLOW A STRICT DIABETIC DIET 09/23/17  Yes [name] Altes, MD  Insulin Pen Needle (PEN NEEDLES)  31G X 8 MM MISC Use to inject insulin 4 times a day.  Dx: E11.10 12/23/17  Yes [name], [name] E, MD  Lancets (ONETOUCH ULTRASOFT) lancets Use to check blood sugar daily as needed.  Dx: E11.10 12/23/17  Yes [name], [name] E, MD  levothyroxine (SYNTHROID, LEVOTHROID) 150 MCG tablet Take 1 tablet (150 mcg total) by mouth daily. 04/03/17  Yes [name] Kingdom, MD  losartan (COZAAR) 25 MG tablet Take 25 mg by mouth daily. 05/27/17  Yes [provider]  metoCLOPramide (REGLAN) 5 MG tablet TAKE 1 TABLET BY MOUTH 4 TIMES A DAY BEFORE MEALS AND AT BEDTIME 12/23/17  Yes [name], [name] E, MD  metoprolol succinate (TOPROL-XL) 25 MG 24 hr tablet Take 25 mg by mouth daily. 03/24/17  Yes [provider]  nitroGLYCERIN (NITRODUR - DOSED IN MG/24 HR) 0.2 mg/hr patch Place 1 patch (0.2 mg total) onto the skin daily as needed. Patient taking differently: Place 0.2 mg onto the skin daily as needed (chest pain).  12/14/17  Yes [name], [name] A, MD  ondansetron (ZOFRAN) 4 MG tablet TAKE 1 TABLET BY MOUTH EVERY 6 HOURS AS NEEDED FOR NAUSEA AND VOMITING 12/20/17  Yes [name], [name] E, MD  ONETOUCH VERIO test strip USE TO CHECK BLOOD SUGAR DAILY AS NEEDED. DX: E11.10 10/20/17  Yes [name], [name] E, MD  pantoprazole (PROTONIX) 40 MG tablet Take 1 tablet (40 mg total) by mouth daily at 12 noon. 12/14/17  Yes [name], [name] A, MD  RELPAX 40 MG tablet ONE TABLET BY MOUTH AT ONSET OF HEADACHE. MAY REPEAT IN 2 HOURS IF HEADACHE PERSISTS OR RECURS. 10/17/16  Yes [name], [name] E, MD  sertraline (ZOLOFT) 50 MG tablet Take 1 tablet (50 mg total) by mouth daily. 12/16/17  Yes [name], [name] E, MD  promethazine (PHENERGAN) 25 MG suppository Place 1 suppository (25 mg total) rectally every 6 (six) hours as needed for nausea or vomiting. 12/26/17   [name] [name], [name] Sorrow, MD    Family History Family History  Problem Relation Age of Onset  . Diabetes Other   . Heart disease Other   . Heart failure Mother   . Diabetes Maternal Grandmother    . Alzheimer's disease Maternal Grandmother   . Coronary artery disease Maternal Grandmother   . Heart attack Maternal Grandfather   . Heart failure Brother     Social History Social History   Tobacco Use  . Smoking status: Former Smoker    Years: 0.20    Types: Cigarettes    Last attempt to quit: 12/10/1990    Years since quitting: 27.0  . Smokeless tobacco: Never Used  Substance Use Topics  . Alcohol use: No    Alcohol/week: 0.0 oz  . Drug  use: Yes    Frequency: 3.0 times per week    Types: Marijuana     Allergies   Codeine   Review of Systems Review of Systems  Constitutional: Negative for activity change, fatigue and fever.  Respiratory: Negative for shortness of breath.   Cardiovascular: Negative for chest pain.  Gastrointestinal: Positive for abdominal pain, nausea and vomiting.  All other systems reviewed and are negative.    Physical Exam Updated Vital Signs BP 118/71   Pulse (!) 104   Temp 98.5 F (36.9 C) (Oral)   Resp (!) 23   LMP 08/11/2012   SpO2 96%   Physical Exam  Constitutional: She is oriented to person, place, and time. She appears well-developed and well-nourished.  HENT:  Head: Normocephalic and atraumatic.  Eyes: Right eye exhibits no discharge.  Cardiovascular: Normal rate, regular rhythm and normal heart sounds.  No murmur heard. Pulmonary/Chest: Effort normal and breath sounds normal. She has no wheezes. She has no rales.  Abdominal: Soft. She exhibits no distension. There is tenderness.  Diffuse tenderness.  Neurological: She is oriented to person, place, and time.  Skin: Skin is warm and dry. She is not diaphoretic.  Psychiatric: She has a normal mood and affect.  Nursing note and vitals reviewed.    ED Treatments / Results  Labs (all labs ordered are listed, but only abnormal results are displayed) Labs Reviewed  BASIC METABOLIC PANEL - Abnormal; Notable for the following components:      Result Value   Potassium 3.4  (*)    CO2 21 (*)    Glucose, Bld 471 (*)    All other components within normal limits  CBC - Abnormal; Notable for the following components:   WBC 17.8 (*)    RBC 5.35 (*)    Hemoglobin 15.8 (*)    All other components within normal limits  URINALYSIS, ROUTINE W REFLEX MICROSCOPIC - Abnormal; Notable for the following components:   Color, Urine STRAW (*)    Glucose, UA >=500 (*)    Ketones, ur 20 (*)    Protein, ur 30 (*)    Squamous Epithelial / LPF 0-5 (*)    All other components within normal limits  MAGNESIUM - Abnormal; Notable for the following components:   Magnesium 1.6 (*)    All other components within normal limits  BLOOD GAS, VENOUS - Abnormal; Notable for the following components:   pCO2, Ven 37.5 (*)    pO2, Ven 59.9 (*)    All other components within normal limits  CBG MONITORING, ED - Abnormal; Notable for the following components:   Glucose-Capillary 442 (*)    All other components within normal limits  CBG MONITORING, ED - Abnormal; Notable for the following components:   Glucose-Capillary 281 (*)    All other components within normal limits  LIPASE, BLOOD  PHOSPHORUS  I-STAT VENOUS BLOOD GAS, ED  I-STAT CG4 LACTIC ACID, ED  I-STAT CG4 LACTIC ACID, ED    EKG  EKG Interpretation None       Radiology Ct Abdomen Pelvis W Contrast  Result Date: 12/26/2017 CLINICAL DATA:  Abdominal pain with nausea and vomiting EXAM: CT ABDOMEN AND PELVIS WITH CONTRAST TECHNIQUE: Multidetector CT imaging of the abdomen and pelvis was performed using the standard protocol following bolus administration of intravenous contrast. CONTRAST:  139mL ISOVUE-300 IOPAMIDOL (ISOVUE-300) INJECTION 61% COMPARISON:  CT 09/19/2017 FINDINGS: Lower chest: Lung bases demonstrate no acute consolidation or effusion. Normal heart size. Hepatobiliary: Mild steatosis. No calcified  gallstone or biliary dilatation. Pancreas: Unremarkable. No pancreatic ductal dilatation or surrounding inflammatory  changes. Spleen: Normal in size without focal abnormality. Adrenals/Urinary Tract: Adrenal glands are unremarkable. Kidneys are normal, without renal calculi, focal lesion, or hydronephrosis. Bladder is unremarkable. Stomach/Bowel: Stomach is within normal limits. Appendix appears normal. No evidence of bowel wall thickening, distention, or inflammatory changes. Collapsed appearance of the distal colon. Colon diverticular disease without acute inflammation. Vascular/Lymphatic: No significant vascular findings are present. No enlarged abdominal or pelvic lymph nodes. Reproductive: Status post hysterectomy. No adnexal masses. Other: Negative for free air or free fluid. Small fat in the umbilical region. Musculoskeletal: No acute or significant osseous findings. IMPRESSION: 1. No CT evidence for acute intra-abdominal or pelvic abnormality. 2. Colon diverticular disease without acute inflammation. 3. Hepatic steatosis. Electronically Signed   By: Donavan Foil M.D.   On: 12/26/2017 19:02    Procedures Procedures (including critical care time)  Medications Ordered in ED Medications  sodium chloride 0.9 % bolus 1,000 mL (1,000 mLs Intravenous New Bag/Given 12/26/17 1716)  sodium chloride 0.9 % bolus 1,000 mL (0 mLs Intravenous Stopped 12/26/17 1949)  promethazine (PHENERGAN) injection 12.5 mg (12.5 mg Intravenous Given 12/26/17 1758)  iopamidol (ISOVUE-300) 61 % injection (100 mLs  Contrast Given 12/26/17 1837)     Initial Impression / Assessment and Plan / ED Course  I have reviewed the triage vital signs and the nursing notes.  Pertinent labs & imaging results that were available during my care of the patient were reviewed by me and considered in my medical decision making (see chart for details).      Patient is a 57 year old female presenting with nausea and abdominal pain.  Patient had symptoms that started last night.  She reports she ate dinner and then developed some vomiting.  She reports mild  abdominal pain 2.  She reports having issues with DKA in the past and so she came here to be evaluated.  Patient reports being able to take p.o.  With mild nausea.   6:37 PM VBG shows no evidence of acidosis.  Patient is not in DKA.  Rather just hyperglycemic.  We will get CT because of patient's abdominal pain.  However with normal vital signs and normal labs this is very reassuring.   11:00 PM Patient's been seen multiple times with the past.  Husband thinks it may have to [name] with cannabis use.  We recommended refrain from using this.  Patient has a slight tachycardia.  Sugar is gone down to 250.  Patient had no evidence of DKA.  Patient was able to take fluids without issue here in the emergency department.  CT was normal.  We will have her follow-up with her primary care physician.  Continue to take oral fluids and continue take her insulin as prescribed.  Final Clinical Impressions(s) / ED Diagnoses   Final diagnoses:  Dehydration  Non-intractable vomiting with nausea, unspecified vomiting type    ED Discharge Orders        Ordered    promethazine (PHENERGAN) 25 MG suppository  Every 6 hours PRN     12/26/17 2300       Macarthur Critchley, MD 12/26/17 2301

## 2017-12-26 NOTE — ED Notes (Signed)
Patient transported to CT 

## 2018-01-06 NOTE — Progress Notes (Signed)
Cardiology Office Note   Date:  01/07/2018   ID:  [name] [name], DOB [birth year], MRN [id number]  PCP:  Jinny Sanders, MD  Cardiologist:   Minus Breeding, MD   Chief Complaint  Patient presents with  . Chest Pain      History of Present Illness: [name] [name] is a 57 y.o. female who presents for follow up of atrial fib.  I saw her last in 2016 for this.  She had atrial fib in 2012.  She had an abnormal stress test with normal coronaries on cath.  EF is 55%.  She was having problems with her thyroid at that point time.  When I saw her last she was having chest discomfort. I sent her for a POET (Plain Old Exercise Treadmill) .  This was negative for ischemia.  I did send her for an event monitor to make sure she was not having any recurrent fibrillation.  There was never any evidence of fibrillation.  She was taken off of anticoagulation as it was felt that her atrial fibrillation was related to her thyroid problems at the time.  These had since been corrected.  In 2018 she was found to have an elevated troponin and an EF of 35-40%.  Cardiac cath demonstrated normal coronaries however.  When we saw her most recently in consultation when she was in the hospital in Jan with poorly controlled blood sugars there was a question of her compliance with her ace inhibitors and her beta-blockers.  She was managed medically.  She also had some chronic atypical chest pain.  I have reviewed extensive hospital records for this visit.    Since going home she has done relatively well.  She takes care of her 41-year-old grandchild and she is fatigued after this. She tries to get 6000 steps daily. She denies any acute shortness of breath, PND or orthopnea. She has no palpitations, presyncope or syncope. She had no weight gain or edema.     Past Medical History:  Diagnosis Date  . Anxiety   . B12 deficiency   . Chest pain 04/05/2017  . GERD (gastroesophageal reflux disease)   . Hyperlipidemia   .  Hypertension    [name]  . Hypothyroidism   . IBS (irritable bowel syndrome)   . Intermittent vertigo   . 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 Leg cramping    "@ night" (09/18/2013)  . Migraine    "once q couple months" (09/18/2013)  . Osteoporosis   . Peripheral neuropathy   . Type II diabetes mellitus ([place])   . Umbilical hernia    unrepaired (09/18/2013)    Past Surgical History:  Procedure Laterality Date  . CESAREAN SECTION  1989  . LAPAROSCOPIC ASSISTED VAGINAL HYSTERECTOMY  09/23/2012   Procedure: LAPAROSCOPIC ASSISTED VAGINAL HYSTERECTOMY;  Surgeon: Linda Hedges, DO;  Location: [facility];  Service: Gynecology;  Laterality: N/A;  pull Dr Gregor Hams instrument  . LEFT HEART CATH AND CORONARY ANGIOGRAPHY N/A 02/11/2017   Procedure: Left Heart Cath and Coronary Angiography;  Surgeon: Lorretta Harp, MD;  Location: [facility];  Service: Cardiovascular;  Laterality: N/A;  . TUBAL LIGATION Bilateral 1992  . VAGINAL HYSTERECTOMY  2013     Current Outpatient Medications  Medication Sig Dispense Refill  . aspirin 81 MG chewable tablet Chew 1 tablet (81 mg total) by mouth daily. 30 tablet 0  . atorvastatin (LIPITOR) 80 MG tablet Take 1 tablet (80 mg  total) by mouth daily at 6 PM. 90 tablet 0  . ezetimibe (ZETIA) 10 MG tablet Take 1 tablet (10 mg total) by mouth daily. 30 tablet 11  . Insulin Glargine (BASAGLAR KWIKPEN [place]) Inject 22 Units into the skin at bedtime.    . insulin lispro (HUMALOG KWIKPEN) 100 UNIT/ML KiwkPen Inject into skin 3 times a day at meal time according to the following sliding scale: CBG 70-120 - 0 units / CBG 121-150 - 1 unit / CBG 151-200 - 2 units / CBG 201-250 - 3 units / CBG 251-300 - 5 units / CBG 301-350 - 7 units / CBG 351-400+ - 9 units  YOU MUST FOLLOW A STRICT DIABETIC DIET 15 mL 0  . Insulin Pen Needle (PEN NEEDLES) 31G X 8 MM MISC Use to inject insulin 4 times a day.  Dx: E11.10 130 each 11  . Lancets (ONETOUCH  ULTRASOFT) lancets Use to check blood sugar daily as needed.  Dx: E11.10 100 each 3  . levothyroxine (SYNTHROID, LEVOTHROID) 150 MCG tablet Take 1 tablet (150 mcg total) by mouth daily. 90 tablet 3  . losartan (COZAAR) 25 MG tablet Take 25 mg by mouth daily.  12  . metoCLOPramide (REGLAN) 5 MG tablet TAKE 1 TABLET BY MOUTH 4 TIMES A DAY BEFORE MEALS AND AT BEDTIME 30 tablet 0  . metoprolol succinate (TOPROL-XL) 25 MG 24 hr tablet Take 25 mg by mouth daily.  12  . nitroGLYCERIN (NITRODUR - DOSED IN MG/24 HR) 0.2 mg/hr patch Place 1 patch (0.2 mg total) onto the skin daily as needed. (Patient taking differently: Place 0.2 mg onto the skin daily as needed (chest pain). ) 10 patch 12  . ondansetron (ZOFRAN) 4 MG tablet TAKE 1 TABLET BY MOUTH EVERY 6 HOURS AS NEEDED FOR NAUSEA AND VOMITING 30 tablet 0  . ONETOUCH VERIO test strip USE TO CHECK BLOOD SUGAR DAILY AS NEEDED. DX: E11.10 100 each 3  . pantoprazole (PROTONIX) 40 MG tablet Take 1 tablet (40 mg total) by mouth daily at 12 noon. 30 tablet 0  . promethazine (PHENERGAN) 25 MG suppository Place 1 suppository (25 mg total) rectally every 6 (six) hours as needed for nausea or vomiting. 12 each 0  . RELPAX 40 MG tablet ONE TABLET BY MOUTH AT ONSET OF HEADACHE. MAY REPEAT IN 2 HOURS IF HEADACHE PERSISTS OR RECURS. 10 tablet 2  . sertraline (ZOLOFT) 50 MG tablet Take 1 tablet (50 mg total) by mouth daily. 90 tablet 1   No current facility-administered medications for this visit.     Allergies:   Codeine    ROS:  Please see the history of present illness.   Otherwise, review of systems are positive for none.   All other systems are reviewed and negative.    PHYSICAL EXAM: VS:  BP 90/64   Pulse 85   Ht 5' 0.5" (1.537 m)   Wt 137 lb (62.1 kg)   LMP 08/11/2012   BMI 26.32 kg/m  , BMI Body mass index is 26.32 kg/m. GENERAL:  Well appearing HEENT:  Pupils equal round and reactive, fundi not visualized, oral mucosa unremarkable, poor dentition    NECK:  No jugular venous distention, waveform within normal limits, carotid upstroke brisk and symmetric, no bruits, no thyromegaly LYMPHATICS:  No cervical, inguinal adenopathy LUNGS:  Clear to auscultation bilaterally BACK:  No CVA tenderness CHEST:  Unremarkable HEART:  PMI not displaced or sustained,S1 and S2 within normal limits, no S3, no S4, no clicks, no  rubs, no murmurs ABD:  Flat, positive bowel sounds normal in frequency in pitch, no bruits, no rebound, no guarding, no midline pulsatile mass, no hepatomegaly, no splenomegaly EXT:  2 plus pulses throughout, no edema, no cyanosis no clubbing SKIN:  No rashes no nodules NEURO:  Cranial nerves II through XII grossly intact, motor grossly intact throughout PSYCH:  Cognitively intact, oriented to person place and time    EKG:  EKG is ordered today. The ekg ordered today demonstrates sinus rhythm, rate 85, axis within normal limits, intervals within normal limits, no acute ST-T wave changes.   Recent Labs: 12/11/2017: TSH 0.016 12/14/2017: ALT 15 12/26/2017: BUN 17; Creatinine, Ser 0.66; Hemoglobin 15.8; Magnesium 1.6; Platelets 389; Potassium 3.4; Sodium 136    Lipid Panel    Component Value Date/Time   CHOL 256 (H) 12/12/2017 2000   TRIG 71 12/12/2017 2000   HDL 55 12/12/2017 2000   CHOLHDL 4.7 12/12/2017 2000   VLDL 14 12/12/2017 2000   LDLCALC 187 (H) 12/12/2017 2000   LDLDIRECT 206.0 07/27/2014 0745      Wt Readings from Last 3 Encounters:  01/07/18 137 lb (62.1 kg)  12/20/17 138 lb 4 oz (62.7 kg)  12/14/17 130 lb 11.7 oz (59.3 kg)      Other studies Reviewed: Additional studies/ records that were reviewed today include: Extensive review of hospital records from earlier this month. . Review of the above records demonstrates:  Please see elsewhere in the note.     ASSESSMENT AND PLAN:   Chronic systolic heart failure-   The patient seems to be euvolemic. Her blood pressure will not allow med titration. She'll  continue the meds as listed.   Noncardiac chest pain -   She continues to have chronic chest pain complaint non-cardiac. No further workup is planned.  HTN -  BP is actually low and the rate limiting step for med titration.  No change in therapy.    Poorly controlled diabetes -      A1c was 11.1. This was addressed in the hospital and she is to follow-up with Jinny Sanders, MD Lab Results  Component Value Date   HGBA1C 11.1 (H) 12/11/2017    Previous history of atrial fibrillation -   She's had no symptomatic paroxysms of this. No change in therapy.   Current medicines are reviewed at length with the patient today.  The patient does not have concerns regarding medicines.  The following changes have been made:  no change  Labs/ tests ordered today include: None  Orders Placed This Encounter  Procedures  . EKG 12-Lead     Disposition:   FU with APP in four months.      Signed, Minus Breeding, MD  01/07/2018 2:44 PM    [place]

## 2018-01-07 ENCOUNTER — Ambulatory Visit: Payer: 59 | Admitting: Cardiology

## 2018-01-07 ENCOUNTER — Encounter: Payer: Self-pay | Admitting: Cardiology

## 2018-01-07 VITALS — BP 90/64 | HR 85 | Ht 60.5 in | Wt 137.0 lb

## 2018-01-07 DIAGNOSIS — I420 Dilated cardiomyopathy: Secondary | ICD-10-CM

## 2018-01-07 DIAGNOSIS — I10 Essential (primary) hypertension: Secondary | ICD-10-CM | POA: Diagnosis not present

## 2018-01-07 DIAGNOSIS — I4891 Unspecified atrial fibrillation: Secondary | ICD-10-CM

## 2018-01-07 NOTE — Patient Instructions (Signed)
Medication Instructions:  Continue current medications  If you need a refill on your cardiac medications before your next appointment, please call your pharmacy.  Labwork: None Ordered   Testing/Procedures: None Ordered  Follow-Up: Your physician wants you to follow-up in: 4 Months with Rosaria Ferries or [facility].    Thank you for choosing [facility] at [facility]!!

## 2018-01-10 ENCOUNTER — Other Ambulatory Visit: Payer: Self-pay | Admitting: Family Medicine

## 2018-01-14 DIAGNOSIS — I5022 Chronic systolic (congestive) heart failure: Secondary | ICD-10-CM | POA: Insufficient documentation

## 2018-01-14 DIAGNOSIS — Z9119 Patient's noncompliance with other medical treatment and regimen: Secondary | ICD-10-CM | POA: Insufficient documentation

## 2018-01-14 DIAGNOSIS — Z91199 Patient's noncompliance with other medical treatment and regimen due to unspecified reason: Secondary | ICD-10-CM | POA: Insufficient documentation

## 2018-01-14 NOTE — Assessment & Plan Note (Signed)
euvolemic today in office

## 2018-01-14 NOTE — Assessment & Plan Note (Signed)
Due for re-eval of electrolytes.

## 2018-01-14 NOTE — Assessment & Plan Note (Signed)
Encouraged pt yet again to take medicine regularly.

## 2018-01-14 NOTE — Assessment & Plan Note (Signed)
Now resolved DKA but sugar remains poorly controlled.  Pt discharged by her endocrinologist due to no shows. ncrease Basalgar to 20 Units daily, Gaol fasting blood sugar in < 120.  Bring in measurements  Continue sliding scale.  Avoid soda and sweets.  Close follow up.

## 2018-01-14 NOTE — Assessment & Plan Note (Signed)
No clear indication for antibiotics. Symptomatic care, start trial of flonase.

## 2018-01-21 ENCOUNTER — Other Ambulatory Visit (INDEPENDENT_AMBULATORY_CARE_PROVIDER_SITE_OTHER): Payer: 59

## 2018-01-21 ENCOUNTER — Telehealth (INDEPENDENT_AMBULATORY_CARE_PROVIDER_SITE_OTHER): Payer: 59 | Admitting: Family Medicine

## 2018-01-21 ENCOUNTER — Other Ambulatory Visit: Payer: Self-pay | Admitting: Family Medicine

## 2018-01-21 DIAGNOSIS — E538 Deficiency of other specified B group vitamins: Secondary | ICD-10-CM | POA: Diagnosis not present

## 2018-01-21 DIAGNOSIS — E559 Vitamin D deficiency, unspecified: Secondary | ICD-10-CM

## 2018-01-21 DIAGNOSIS — E039 Hypothyroidism, unspecified: Secondary | ICD-10-CM

## 2018-01-21 LAB — VITAMIN B12: VITAMIN B 12: 327 pg/mL (ref 211–911)

## 2018-01-21 LAB — VITAMIN D 25 HYDROXY (VIT D DEFICIENCY, FRACTURES): VITD: 18.46 ng/mL — AB (ref 30.00–100.00)

## 2018-01-21 LAB — MAGNESIUM: MAGNESIUM: 1.8 mg/dL (ref 1.5–2.5)

## 2018-01-21 NOTE — Telephone Encounter (Signed)
Last office visit 12/20/2017.  Last refilled 01/10/2018 for #30 with no refills.  Ok to refill?

## 2018-01-21 NOTE — Telephone Encounter (Signed)
-----   Message from Ellamae Sia sent at 01/16/2018  3:26 PM EST ----- Regarding: Lab orders for Tuesdaym 2.12.19 Patient is scheduled for CPX labs, please order future labs, Thanks , Karna Christmas

## 2018-01-22 ENCOUNTER — Telehealth: Payer: Self-pay | Admitting: Cardiology

## 2018-01-22 NOTE — Telephone Encounter (Signed)
[name] [name] with Nurse Case Manager with [employer],  [name] [name] would like EF Fraction and [facility] listing for Heart Failure.

## 2018-01-22 NOTE — Telephone Encounter (Signed)
For ECHO dated 09-21-2107 LV EF: 30% -   35%  No  [place] [facility] listing for Heart Failure is not included in this report. Left detailed VM stating this.

## 2018-01-28 ENCOUNTER — Encounter: Payer: Self-pay | Admitting: Family Medicine

## 2018-01-28 ENCOUNTER — Ambulatory Visit (INDEPENDENT_AMBULATORY_CARE_PROVIDER_SITE_OTHER): Payer: 59 | Admitting: Family Medicine

## 2018-01-28 VITALS — BP 104/64 | HR 84 | Temp 97.9°F | Ht 60.6 in | Wt 138.5 lb

## 2018-01-28 DIAGNOSIS — Z0000 Encounter for general adult medical examination without abnormal findings: Secondary | ICD-10-CM

## 2018-01-28 DIAGNOSIS — Z9119 Patient's noncompliance with other medical treatment and regimen: Secondary | ICD-10-CM | POA: Diagnosis not present

## 2018-01-28 DIAGNOSIS — Z794 Long term (current) use of insulin: Secondary | ICD-10-CM

## 2018-01-28 DIAGNOSIS — E1110 Type 2 diabetes mellitus with ketoacidosis without coma: Secondary | ICD-10-CM | POA: Diagnosis not present

## 2018-01-28 DIAGNOSIS — E559 Vitamin D deficiency, unspecified: Secondary | ICD-10-CM

## 2018-01-28 DIAGNOSIS — Z91199 Patient's noncompliance with other medical treatment and regimen due to unspecified reason: Secondary | ICD-10-CM

## 2018-01-28 LAB — HM DIABETES FOOT EXAM

## 2018-01-28 MED ORDER — PANTOPRAZOLE SODIUM 40 MG PO TBEC: 40.0000 mg | DELAYED_RELEASE_TABLET | Freq: Every day | ORAL | 11 refills | Status: DC

## 2018-01-28 MED ORDER — VITAMIN D (ERGOCALCIFEROL) 1.25 MG (50000 UNIT) PO CAPS: 50000.0000 [IU] | ORAL_CAPSULE | ORAL | 0 refills | Status: DC

## 2018-01-28 NOTE — Assessment & Plan Note (Signed)
Replete x 12 weeks then go to OTC supplementation.

## 2018-01-28 NOTE — Progress Notes (Signed)
Subjective:    Patient ID: [name] [name], female    DOB: 1961-09-05, 57 y.o.   MRN: [id number]  HPI   The patient is here for annual wellness exam and preventative care.    She continue to struggle with control of DM and DKA episodes, dehydration. Followed by  ENDO in past but recently discharged for no show.   She is currently on Balsalgar 23 units daily. Using SSI.Marland Kitchen  2-8 units Humalog before meals. FBS  80s Before meals 175  no sugars > 200 in last few weeks.  Has stopped soda. Stress in better.  She has a Education officer, museum with her insurance helping her get set up with new ENDO through [employer]  Nml mg, nml B12.  Low vit D  Blood pressure 104/64, pulse 84, temperature 97.9 F (36.6 C), temperature source Oral, height 5' 0.6" (1.539 m), weight 138 lb 8 oz (62.8 kg), last menstrual period 08/11/2012.  Review of Systems  Constitutional: Positive for fatigue. Negative for fever.  HENT: Negative for congestion.   Eyes: Negative for pain.  Respiratory: Negative for cough and shortness of breath.   Cardiovascular: Negative for chest pain, palpitations and leg swelling.  Gastrointestinal: Negative for abdominal pain.  Genitourinary: Negative for dysuria and vaginal bleeding.  Musculoskeletal: Negative for back pain.  Neurological: Negative for syncope, light-headedness and headaches.  Psychiatric/Behavioral: Negative for dysphoric mood.       Objective:   Physical Exam  Constitutional: Vital signs are normal. She appears well-developed and well-nourished. She is cooperative.  Non-toxic appearance. She does not appear ill. No distress.  HENT:  Head: Normocephalic.  Right Ear: Hearing, tympanic membrane, external ear and ear canal normal.  Left Ear: Hearing, tympanic membrane, external ear and ear canal normal.  Nose: Nose normal.  Eyes: Conjunctivae, EOM and lids are normal. Pupils are equal, round, and reactive to light. Lids are everted and swept, no foreign bodies  found.  Neck: Trachea normal and normal range of motion. Neck supple. Carotid bruit is not present. No thyroid mass and no thyromegaly present.  Cardiovascular: Normal rate, regular rhythm, S1 normal, S2 normal, normal heart sounds and intact distal pulses. Exam reveals no gallop.  No murmur heard. Pulmonary/Chest: Effort normal and breath sounds normal. No respiratory distress. She has no wheezes. She has no rhonchi. She has no rales.  Abdominal: Soft. Normal appearance and bowel sounds are normal. She exhibits no distension, no fluid wave, no abdominal bruit and no mass. There is no hepatosplenomegaly. There is no tenderness. There is no rebound, no guarding and no CVA tenderness. No hernia.  Lymphadenopathy:    She has no cervical adenopathy.    She has no axillary adenopathy.  Neurological: She is alert. She has normal strength. No cranial nerve deficit or sensory deficit.  Skin: Skin is warm, dry and intact. No rash noted.  Psychiatric: Her speech is normal and behavior is normal. Judgment normal. Her mood appears not anxious. Cognition and memory are normal. She does not exhibit a depressed mood.     Diabetic foot exam: Normal inspection No skin breakdown No calluses  Normal DP pulses Normal sensation to light touch and monofilament Nails normal      Assessment & Plan:  The patient's preventative maintenance and recommended screening tests for an annual wellness exam were reviewed in full today. Brought up to date unless services declined.  Counselled on the importance of diet, exercise, and its role in overall health and mortality. The patient's  FH and SH was reviewed, including their home life, tobacco status, and drug and alcohol status.   Vaccines: uptodate Pap/DVE: partial hysterectomy, no symptoms, no family history of ovarian cancer.. no DVE. Mammo:  due Colon:   2009 per Dr. Ardis Hughs Smoking Status: nonsmoker ETOH/ drug use: none/none  Hep C:  Will do next lab  draw.  HIV screen:   refused

## 2018-01-28 NOTE — Patient Instructions (Addendum)
Complete vit D course x 12 week then go to OTC vit 400 IU 1-2 times daily.  Keep working on healthy lifestlye and medication adherence.  Call GI to determine when next colonscopy due.  Call to schedule mammogram on your own.  Call to set up eye doctor evaluation for DM.

## 2018-01-28 NOTE — Assessment & Plan Note (Signed)
Improving control with adherence to meds. Continue current dose and move forward with setting up new ENDo

## 2018-02-02 ENCOUNTER — Inpatient Hospital Stay (HOSPITAL_COMMUNITY)
Admission: EM | Admit: 2018-02-02 | Discharge: 2018-02-04 | DRG: 638 | Disposition: A | Discharge status: Home / Self Care | Payer: 59 | Attending: Family Medicine | Admitting: Family Medicine

## 2018-02-02 ENCOUNTER — Emergency Department (HOSPITAL_COMMUNITY): Payer: 59

## 2018-02-02 ENCOUNTER — Encounter (HOSPITAL_COMMUNITY): Payer: Self-pay | Admitting: Emergency Medicine

## 2018-02-02 DIAGNOSIS — E876 Hypokalemia: Secondary | ICD-10-CM | POA: Diagnosis present

## 2018-02-02 DIAGNOSIS — K219 Gastro-esophageal reflux disease without esophagitis: Secondary | ICD-10-CM | POA: Diagnosis present

## 2018-02-02 DIAGNOSIS — E039 Hypothyroidism, unspecified: Secondary | ICD-10-CM | POA: Diagnosis present

## 2018-02-02 DIAGNOSIS — E1110 Type 2 diabetes mellitus with ketoacidosis without coma: Secondary | ICD-10-CM | POA: Diagnosis present

## 2018-02-02 DIAGNOSIS — Z7982 Long term (current) use of aspirin: Secondary | ICD-10-CM

## 2018-02-02 DIAGNOSIS — M810 Age-related osteoporosis without current pathological fracture: Secondary | ICD-10-CM | POA: Diagnosis present

## 2018-02-02 DIAGNOSIS — K3184 Gastroparesis: Secondary | ICD-10-CM | POA: Diagnosis present

## 2018-02-02 DIAGNOSIS — G629 Polyneuropathy, unspecified: Secondary | ICD-10-CM | POA: Diagnosis present

## 2018-02-02 DIAGNOSIS — E1143 Type 2 diabetes mellitus with diabetic autonomic (poly)neuropathy: Secondary | ICD-10-CM | POA: Diagnosis not present

## 2018-02-02 DIAGNOSIS — I5022 Chronic systolic (congestive) heart failure: Secondary | ICD-10-CM | POA: Diagnosis present

## 2018-02-02 DIAGNOSIS — I428 Other cardiomyopathies: Secondary | ICD-10-CM | POA: Diagnosis present

## 2018-02-02 DIAGNOSIS — E785 Hyperlipidemia, unspecified: Secondary | ICD-10-CM | POA: Diagnosis present

## 2018-02-02 DIAGNOSIS — I130 Hypertensive heart and chronic kidney disease with heart failure and stage 1 through stage 4 chronic kidney disease, or unspecified chronic kidney disease: Secondary | ICD-10-CM | POA: Diagnosis present

## 2018-02-02 DIAGNOSIS — E1022 Type 1 diabetes mellitus with diabetic chronic kidney disease: Secondary | ICD-10-CM | POA: Diagnosis present

## 2018-02-02 DIAGNOSIS — Z885 Allergy status to narcotic agent status: Secondary | ICD-10-CM

## 2018-02-02 DIAGNOSIS — E1043 Type 1 diabetes mellitus with diabetic autonomic (poly)neuropathy: Secondary | ICD-10-CM | POA: Diagnosis present

## 2018-02-02 DIAGNOSIS — E1010 Type 1 diabetes mellitus with ketoacidosis without coma: Secondary | ICD-10-CM | POA: Diagnosis not present

## 2018-02-02 DIAGNOSIS — F411 Generalized anxiety disorder: Secondary | ICD-10-CM | POA: Diagnosis present

## 2018-02-02 DIAGNOSIS — E1310 Other specified diabetes mellitus with ketoacidosis without coma: Secondary | ICD-10-CM | POA: Diagnosis not present

## 2018-02-02 DIAGNOSIS — I25119 Atherosclerotic heart disease of native coronary artery with unspecified angina pectoris: Secondary | ICD-10-CM | POA: Diagnosis present

## 2018-02-02 DIAGNOSIS — N182 Chronic kidney disease, stage 2 (mild): Secondary | ICD-10-CM | POA: Diagnosis present

## 2018-02-02 DIAGNOSIS — K589 Irritable bowel syndrome without diarrhea: Secondary | ICD-10-CM | POA: Diagnosis present

## 2018-02-02 DIAGNOSIS — Z79899 Other long term (current) drug therapy: Secondary | ICD-10-CM

## 2018-02-02 DIAGNOSIS — I420 Dilated cardiomyopathy: Secondary | ICD-10-CM | POA: Diagnosis present

## 2018-02-02 DIAGNOSIS — I10 Essential (primary) hypertension: Secondary | ICD-10-CM | POA: Diagnosis present

## 2018-02-02 DIAGNOSIS — Z87891 Personal history of nicotine dependence: Secondary | ICD-10-CM

## 2018-02-02 LAB — BASIC METABOLIC PANEL
ANION GAP: 16 — AB (ref 5–15)
Anion gap: 25 — ABNORMAL HIGH (ref 5–15)
Anion gap: 17 — ABNORMAL HIGH (ref 5–15)
Anion gap: 11 (ref 5–15)
BUN: 16 mg/dL (ref 6–20)
BUN: 14 mg/dL (ref 6–20)
BUN: 12 mg/dL (ref 6–20)
BUN: 11 mg/dL (ref 6–20)
CHLORIDE: 101 mmol/L (ref 101–111)
CO2: 13 mmol/L — ABNORMAL LOW (ref 22–32)
CO2: 19 mmol/L — AB (ref 22–32)
CO2: 21 mmol/L — AB (ref 22–32)
CO2: 20 mmol/L — AB (ref 22–32)
Calcium: 9.5 mg/dL (ref 8.9–10.3)
Calcium: 8.9 mg/dL (ref 8.9–10.3)
Calcium: 8.7 mg/dL — ABNORMAL LOW (ref 8.9–10.3)
Calcium: 9.1 mg/dL (ref 8.9–10.3)
Chloride: 92 mmol/L — ABNORMAL LOW (ref 101–111)
Chloride: 105 mmol/L (ref 101–111)
Chloride: 98 mmol/L — ABNORMAL LOW (ref 101–111)
Creatinine, Ser: 0.82 mg/dL (ref 0.44–1.00)
Creatinine, Ser: 0.6 mg/dL (ref 0.44–1.00)
Creatinine, Ser: 0.55 mg/dL (ref 0.44–1.00)
Creatinine, Ser: 0.56 mg/dL (ref 0.44–1.00)
GFR calc Af Amer: >60 mL/min (ref >60)
GFR calc non Af Amer: >60 mL/min (ref >60)
GFR calc non Af Amer: >60 mL/min (ref >60)
GFR calc non Af Amer: >60 mL/min (ref >60)
GLUCOSE: 275 mg/dL — AB (ref 65–99)
GLUCOSE: 165 mg/dL — AB (ref 65–99)
GLUCOSE: 282 mg/dL — AB (ref 65–99)
Glucose, Bld: 524 mg/dL (ref 65–99)
POTASSIUM: 2.8 mmol/L — AB (ref 3.5–5.1)
POTASSIUM: 3.4 mmol/L — AB (ref 3.5–5.1)
POTASSIUM: 3.4 mmol/L — AB (ref 3.5–5.1)
Potassium: 3.2 mmol/L — ABNORMAL LOW (ref 3.5–5.1)
Sodium: 130 mmol/L — ABNORMAL LOW (ref 135–145)
Sodium: 137 mmol/L (ref 135–145)
Sodium: 137 mmol/L (ref 135–145)
Sodium: 134 mmol/L — ABNORMAL LOW (ref 135–145)

## 2018-02-02 LAB — RAPID URINE DRUG SCREEN, HOSP PERFORMED
Amphetamines: NOT DETECTED
Barbiturates: NOT DETECTED
Benzodiazepines: NOT DETECTED
Cocaine: NOT DETECTED
Opiates: NOT DETECTED
Tetrahydrocannabinol: POSITIVE — AB

## 2018-02-02 LAB — I-STAT BETA HCG BLOOD, ED (MC, WL, AP ONLY)

## 2018-02-02 LAB — CBG MONITORING, ED
GLUCOSE-CAPILLARY: 250 mg/dL — AB (ref 65–99)
GLUCOSE-CAPILLARY: 176 mg/dL — AB (ref 65–99)
GLUCOSE-CAPILLARY: 280 mg/dL — AB (ref 65–99)
GLUCOSE-CAPILLARY: 293 mg/dL — AB (ref 65–99)
GLUCOSE-CAPILLARY: 253 mg/dL — AB (ref 65–99)
Glucose-Capillary: 529 mg/dL (ref 65–99)
Glucose-Capillary: 342 mg/dL — ABNORMAL HIGH (ref 65–99)
Glucose-Capillary: 191 mg/dL — ABNORMAL HIGH (ref 65–99)
Glucose-Capillary: 190 mg/dL — ABNORMAL HIGH (ref 65–99)
Glucose-Capillary: 146 mg/dL — ABNORMAL HIGH (ref 65–99)
Glucose-Capillary: 123 mg/dL — ABNORMAL HIGH (ref 65–99)

## 2018-02-02 LAB — URINALYSIS, ROUTINE W REFLEX MICROSCOPIC
Bacteria, UA: NONE SEEN
Bilirubin Urine: NEGATIVE
Glucose, UA: >=500 mg/dL — AB
Ketones, ur: 80 mg/dL — AB
Leukocytes, UA: NEGATIVE
Nitrite: NEGATIVE
Protein, ur: 100 mg/dL — AB
RBC / HPF: NONE SEEN RBC/hpf (ref 0–5)
Specific Gravity, Urine: 1.028 (ref 1.005–1.030)
pH: 5 (ref 5.0–8.0)

## 2018-02-02 LAB — GLUCOSE, CAPILLARY: Glucose-Capillary: 167 mg/dL — ABNORMAL HIGH (ref 65–99)

## 2018-02-02 LAB — CBC
HCT: 46.3 % — ABNORMAL HIGH (ref 36.0–46.0)
Hemoglobin: 16.6 g/dL — ABNORMAL HIGH (ref 12.0–15.0)
MCH: 29.4 pg (ref 26.0–34.0)
MCHC: 35.9 g/dL (ref 30.0–36.0)
MCV: 82.1 fL (ref 78.0–100.0)
PLATELETS: 365 10*3/uL (ref 150–400)
RBC: 5.64 MIL/uL — AB (ref 3.87–5.11)
RDW: 14.3 % (ref 11.5–15.5)
WBC: 16.2 10*3/uL — ABNORMAL HIGH (ref 4.0–10.5)

## 2018-02-02 LAB — HEPATIC FUNCTION PANEL
ALT: 16 U/L (ref 14–54)
AST: 20 U/L (ref 15–41)
Albumin: 4.8 g/dL (ref 3.5–5.0)
Alkaline Phosphatase: 112 U/L (ref 38–126)
Bilirubin, Direct: <0.1 mg/dL — ABNORMAL LOW (ref 0.1–0.5)
Total Bilirubin: 1.2 mg/dL (ref 0.3–1.2)
Total Protein: 8.4 g/dL — ABNORMAL HIGH (ref 6.5–8.1)

## 2018-02-02 LAB — I-STAT TROPONIN, ED: TROPONIN I, POC: 0.03 ng/mL (ref 0.00–0.08)

## 2018-02-02 LAB — LIPASE, BLOOD: LIPASE: 69 U/L — AB (ref 11–51)

## 2018-02-02 LAB — MAGNESIUM: Magnesium: 1.9 mg/dL (ref 1.7–2.4)

## 2018-02-02 MED ORDER — DEXTROSE-NACL 5-0.45 % IV SOLN
INTRAVENOUS | Status: DC
Start: 2018-02-02 — End: 2018-02-02
  Administered 2018-02-02: 13:00:00 via INTRAVENOUS

## 2018-02-02 MED ORDER — PANTOPRAZOLE SODIUM 40 MG PO TBEC
40.0000 mg | DELAYED_RELEASE_TABLET | Freq: Every day | ORAL | Status: DC
  Administered 2018-02-02 – 2018-02-04 (×3): 40 mg via ORAL
  Filled 2018-02-02 (×3): qty 1

## 2018-02-02 MED ORDER — ONDANSETRON HCL 4 MG/2ML IJ SOLN
4.0000 mg | Freq: Once | INTRAMUSCULAR | Status: AC
  Administered 2018-02-02: 4 mg via INTRAVENOUS
  Filled 2018-02-02: qty 2

## 2018-02-02 MED ORDER — VITAMIN D (ERGOCALCIFEROL) 1.25 MG (50000 UNIT) PO CAPS
50000.0000 [IU] | ORAL_CAPSULE | ORAL | Status: DC
  Administered 2018-02-04: 50000 [IU] via ORAL
  Filled 2018-02-02: qty 1

## 2018-02-02 MED ORDER — METOCLOPRAMIDE HCL 5 MG/ML IJ SOLN
10.0000 mg | Freq: Once | INTRAMUSCULAR | Status: AC
  Administered 2018-02-02: 10 mg via INTRAVENOUS
  Filled 2018-02-02: qty 2

## 2018-02-02 MED ORDER — POTASSIUM CHLORIDE 10 MEQ/100ML IV SOLN
10.0000 meq | INTRAVENOUS | Status: AC
  Administered 2018-02-02 (×4): 10 meq via INTRAVENOUS
  Filled 2018-02-02 (×4): qty 100

## 2018-02-02 MED ORDER — SODIUM CHLORIDE 0.9 % IV BOLUS (SEPSIS)
1000.0000 mL | Freq: Once | INTRAVENOUS | Status: AC
Start: 2018-02-02 — End: 2018-02-02
  Administered 2018-02-02: 1000 mL via INTRAVENOUS

## 2018-02-02 MED ORDER — LEVOTHYROXINE SODIUM 75 MCG PO TABS
150.0000 ug | ORAL_TABLET | Freq: Every day | ORAL | Status: DC
  Administered 2018-02-02 – 2018-02-04 (×3): 150 ug via ORAL
  Filled 2018-02-02 (×2): qty 1
  Filled 2018-02-02 (×2): qty 2

## 2018-02-02 MED ORDER — METOPROLOL SUCCINATE ER 25 MG PO TB24
25.0000 mg | ORAL_TABLET | Freq: Every day | ORAL | Status: DC
  Administered 2018-02-02 – 2018-02-04 (×3): 25 mg via ORAL
  Filled 2018-02-02 (×3): qty 1

## 2018-02-02 MED ORDER — INSULIN GLARGINE 100 UNIT/ML ~~LOC~~ SOLN
22.0000 [IU] | Freq: Once | SUBCUTANEOUS | Status: AC
  Administered 2018-02-02: 22 [IU] via SUBCUTANEOUS
  Filled 2018-02-02: qty 0.22

## 2018-02-02 MED ORDER — DEXTROSE-NACL 5-0.45 % IV SOLN: INTRAVENOUS | Status: DC

## 2018-02-02 MED ORDER — SERTRALINE HCL 50 MG PO TABS
50.0000 mg | ORAL_TABLET | Freq: Every day | ORAL | Status: DC
  Administered 2018-02-02 – 2018-02-04 (×3): 50 mg via ORAL
  Filled 2018-02-02 (×3): qty 1

## 2018-02-02 MED ORDER — LOSARTAN POTASSIUM 25 MG PO TABS
25.0000 mg | ORAL_TABLET | Freq: Every day | ORAL | Status: DC
  Administered 2018-02-02 – 2018-02-04 (×3): 25 mg via ORAL
  Filled 2018-02-02 (×3): qty 1

## 2018-02-02 MED ORDER — PROMETHAZINE HCL 25 MG RE SUPP
25.0000 mg | Freq: Four times a day (QID) | RECTAL | Status: DC | PRN
  Administered 2018-02-03: 25 mg via RECTAL
  Filled 2018-02-02 (×2): qty 1

## 2018-02-02 MED ORDER — SODIUM CHLORIDE 0.9 % IV SOLN
INTRAVENOUS | Status: DC
  Filled 2018-02-02 (×2): qty 1

## 2018-02-02 MED ORDER — ATORVASTATIN CALCIUM 40 MG PO TABS
80.0000 mg | ORAL_TABLET | Freq: Every day | ORAL | Status: DC
  Administered 2018-02-02 – 2018-02-03 (×2): 80 mg via ORAL
  Filled 2018-02-02: qty 1
  Filled 2018-02-02: qty 2

## 2018-02-02 MED ORDER — SODIUM CHLORIDE 0.9 % IV SOLN
INTRAVENOUS | Status: DC
  Administered 2018-02-02: 2.8 [IU]/h via INTRAVENOUS
  Filled 2018-02-02: qty 1

## 2018-02-02 MED ORDER — SODIUM CHLORIDE 0.9 % IV SOLN
INTRAVENOUS | Status: DC
  Filled 2018-02-02: qty 1

## 2018-02-02 MED ORDER — EZETIMIBE 10 MG PO TABS
10.0000 mg | ORAL_TABLET | Freq: Every day | ORAL | Status: DC
  Administered 2018-02-02 – 2018-02-04 (×3): 10 mg via ORAL
  Filled 2018-02-02 (×3): qty 1

## 2018-02-02 MED ORDER — ASPIRIN 81 MG PO CHEW
81.0000 mg | CHEWABLE_TABLET | Freq: Every day | ORAL | Status: DC
  Administered 2018-02-02 – 2018-02-04 (×3): 81 mg via ORAL
  Filled 2018-02-02 (×3): qty 1

## 2018-02-02 MED ORDER — SODIUM CHLORIDE 0.9 % IV BOLUS (SEPSIS)
1000.0000 mL | Freq: Once | INTRAVENOUS | Status: AC
  Administered 2018-02-02: 1000 mL via INTRAVENOUS

## 2018-02-02 MED ORDER — ONDANSETRON HCL 4 MG/2ML IJ SOLN
4.0000 mg | Freq: Four times a day (QID) | INTRAMUSCULAR | Status: DC | PRN
  Administered 2018-02-02 – 2018-02-04 (×6): 4 mg via INTRAVENOUS
  Filled 2018-02-02 (×8): qty 2

## 2018-02-02 MED ORDER — METOCLOPRAMIDE HCL 5 MG/ML IJ SOLN
10.0000 mg | Freq: Three times a day (TID) | INTRAMUSCULAR | Status: DC
  Administered 2018-02-02 – 2018-02-04 (×7): 10 mg via INTRAVENOUS
  Filled 2018-02-02 (×7): qty 2

## 2018-02-02 MED ORDER — SODIUM CHLORIDE 0.9 % IV SOLN
INTRAVENOUS | Status: DC
  Administered 2018-02-02: 13:00:00 via INTRAVENOUS

## 2018-02-02 MED ORDER — DEXTROSE-NACL 5-0.45 % IV SOLN
INTRAVENOUS | Status: DC
  Administered 2018-02-02: via INTRAVENOUS

## 2018-02-02 NOTE — ED Notes (Signed)
ED TO INPATIENT HANDOFF REPORT  Name/Age/Gender [name] [name] 57 y.o. female  Code Status    Code Status Orders  (From admission, onward)        Start     Ordered   02/02/18 1128  Full code  Continuous     02/02/18 1129    Code Status History    Date Active Date Inactive Code Status Order ID Comments User Context   12/11/2017 12:32 12/14/2017 16:58 Full Code [id number]  [name], PA-C ED   09/19/2017 07:46 09/23/2017 19:24 Full Code [id number]  [name], PA-C ED   09/17/2017 22:03 09/18/2017 19:40 Full Code [id number]  [name], MD ED   06/22/2017 17:09 06/24/2017 19:30 Full Code [id number]  [name], DO Inpatient   06/03/2017 16:26 06/05/2017 19:55 Full Code [id number]  [name], PA-C ED   04/05/2017 11:43 04/08/2017 21:17 Full Code [id number]  [name], NP ED   03/22/2017 21:20 03/23/2017 18:31 Full Code [id number]  [name], MD Inpatient   02/09/2017 09:58 02/11/2017 23:02 Full Code [id number]  [name], NP ED   07/24/2016 16:33 07/26/2016 21:13 Full Code [id number]  [name], MD ED   07/24/2016 16:33 07/24/2016 16:33 Full Code [id number]  [name], MD ED   10/28/2013 23:38 10/29/2013 17:49 Full Code [id number]  [name], MD ED   09/18/2013 16:49 09/20/2013 16:37 Full Code [id number]  [name], MD Inpatient   09/23/2012 11:35 09/25/2012 17:53 Full Code [id number]  [name], RN Inpatient      Home/SNF/Other Home  Chief Complaint nausea, emesis, hyperglycemia  Level of Care/Admitting Diagnosis ED Disposition    ED Disposition Condition [facility] Area: [place] [100102]  Level of Care: Stepdown [14]  Admit to SDU based on following criteria: Hemodynamic compromise or significant risk of instability:  Patient requiring short term acute titration and management of vasoactive drips, and invasive monitoring (i.e., CVP and Arterial line).  Diagnosis:  DKA (diabetic ketoacidoses) [facility]) [482707]  Admitting Physician: [name] [[id number]]  Attending Physician: Derrill Kay A [[id number]]  PT Class (Do Not Modify): Observation [104]  PT Acc Code (Do Not Modify): Observation [10022]       Medical History Past Medical History:  Diagnosis Date  . Anxiety   . B12 deficiency   . Chest pain 04/05/2017  . GERD (gastroesophageal reflux disease)   . Hyperlipidemia   . Hypertension    [name]  . Hypothyroidism   . IBS (irritable bowel syndrome)   . Intermittent vertigo   . 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 Leg cramping    "@ night" (09/18/2013)  . Migraine    "once q couple months" (09/18/2013)  . Osteoporosis   . Peripheral neuropathy   . Type II diabetes mellitus ([place])   . Umbilical hernia    unrepaired (09/18/2013)    Allergies Allergies  Allergen Reactions  . Codeine Hives, Anxiety and Other (See Comments)    IV Location/Drains/Wounds Patient Lines/Drains/Airways Status   Active Line/Drains/Airways    Name:   Placement date:   Placement time:   Site:   Days:   Peripheral IV 02/02/18 Right Hand   02/02/18    0647    Hand   less than 1   Peripheral IV 02/02/18 Left Hand   02/02/18    1709    Hand   less than 1  Wound / Incision (Open or Dehisced) 12/11/17 Non-pressure wound Rectum non-blanching redness   12/11/17    1817    Rectum   53          Labs/Imaging Results for orders placed or performed during the hospital encounter of 02/02/18 (from the past 48 hour(s))  CBG monitoring, ED     Status: Abnormal   Collection Time: 02/02/18  6:55 AM  Result Value Ref Range   Glucose-Capillary 529 (HH) 65 - 99 mg/dL  Basic metabolic panel     Status: Abnormal   Collection Time: 02/02/18  7:52 AM  Result Value Ref Range   Sodium 130 (L) 135 - 145 mmol/L   Potassium 3.2 (L) 3.5 - 5.1 mmol/L   Chloride 92 (L) 101 - 111 mmol/L   CO2 13 (L) 22 - 32 mmol/L   Glucose, Bld 524 (HH) 65 - 99 mg/dL     Comment: CRITICAL RESULT CALLED TO, READ BACK BY AND VERIFIED WITH: M.QUICK,RN 02/02/18 _0  BY V.WILKINS    BUN 16 6 - 20 mg/dL   Creatinine, Ser 0.82 0.44 - 1.00 mg/dL   Calcium 9.5 8.9 - 10.3 mg/dL   GFR calc non Af Amer >60 >60 mL/min   GFR calc Af Amer >60 >60 mL/min    Comment: (NOTE) The eGFR has been calculated using the CKD EPI equation. This calculation has not been validated in all clinical situations. eGFR's persistently <60 mL/min signify possible Chronic Kidney Disease.    Anion gap 25 (H) 5 - 15    Comment: Performed at [facility], [place] [address] [place], [place] [address]  CBC     Status: Abnormal   Collection Time: 02/02/18  7:52 AM  Result Value Ref Range   WBC 16.2 (H) 4.0 - 10.5 K/uL   RBC 5.64 (H) 3.87 - 5.11 MIL/uL   Hemoglobin 16.6 (H) 12.0 - 15.0 g/dL   HCT 46.3 (H) 36.0 - 46.0 %   MCV 82.1 78.0 - 100.0 fL   MCH 29.4 26.0 - 34.0 pg   MCHC 35.9 30.0 - 36.0 g/dL   RDW 14.3 11.5 - 15.5 %   Platelets 365 150 - 400 K/uL    Comment: Performed at [facility], [place] [address], [place], [place] [address]  Urinalysis, Routine w reflex microscopic     Status: Abnormal   Collection Time: 02/02/18  7:52 AM  Result Value Ref Range   Color, Urine STRAW (A) YELLOW   APPearance CLEAR CLEAR   Specific Gravity, Urine 1.028 1.005 - 1.030   pH 5.0 5.0 - 8.0   Glucose, UA >=500 (A) NEGATIVE mg/dL   Hgb urine dipstick MODERATE (A) NEGATIVE   Bilirubin Urine NEGATIVE NEGATIVE   Ketones, ur 80 (A) NEGATIVE mg/dL   Protein, ur 100 (A) NEGATIVE mg/dL   Nitrite NEGATIVE NEGATIVE   Leukocytes, UA NEGATIVE NEGATIVE   RBC / HPF NONE SEEN 0 - 5 RBC/hpf   WBC, UA 0-5 0 - 5 WBC/hpf   Bacteria, UA NONE SEEN NONE SEEN   Squamous Epithelial / LPF 0-5 (A) NONE SEEN   Mucus PRESENT     Comment: Performed at [facility], [place] [address] [place], [place] [address]  I-Stat beta hCG blood, ED     Status: None   Collection  Time: 02/02/18  8:04 AM  Result Value Ref Range   I-stat hCG, quantitative <5.0 <5 mIU/mL   Comment 3  Comment:   GEST. AGE      CONC.  (mIU/mL)   <=1 WEEK        5 - 50     2 WEEKS       50 - 500     3 WEEKS       100 - 10,000     4 WEEKS     1,000 - 30,000        FEMALE AND NON-PREGNANT FEMALE:     LESS THAN 5 mIU/mL   CBG monitoring, ED     Status: Abnormal   Collection Time: 02/02/18  9:39 AM  Result Value Ref Range   Glucose-Capillary 342 (H) 65 - 99 mg/dL  Hepatic function panel     Status: Abnormal   Collection Time: 02/02/18  9:51 AM  Result Value Ref Range   Total Protein 8.4 (H) 6.5 - 8.1 g/dL   Albumin 4.8 3.5 - 5.0 g/dL   AST 20 15 - 41 U/L   ALT 16 14 - 54 U/L   Alkaline Phosphatase 112 38 - 126 U/L   Total Bilirubin 1.2 0.3 - 1.2 mg/dL   Bilirubin, Direct <0.1 (L) 0.1 - 0.5 mg/dL   Indirect Bilirubin NOT CALCULATED 0.3 - 0.9 mg/dL    Comment: Performed at [facility], [place] [address], [place], [place] [address]  Lipase, blood     Status: Abnormal   Collection Time: 02/02/18  9:51 AM  Result Value Ref Range   Lipase 69 (H) 11 - 51 U/L    Comment: Performed at [facility], [place] [address] [place], [place] [address]  Urine rapid drug screen (hosp performed)     Status: Abnormal   Collection Time: 02/02/18  9:53 AM  Result Value Ref Range   Opiates NONE DETECTED NONE DETECTED   Cocaine NONE DETECTED NONE DETECTED   Benzodiazepines NONE DETECTED NONE DETECTED   Amphetamines NONE DETECTED NONE DETECTED   Tetrahydrocannabinol POSITIVE (A) NONE DETECTED   Barbiturates NONE DETECTED NONE DETECTED    Comment: (NOTE) DRUG SCREEN FOR MEDICAL PURPOSES ONLY.  IF CONFIRMATION IS NEEDED FOR ANY PURPOSE, NOTIFY LAB WITHIN 5 DAYS. LOWEST DETECTABLE LIMITS FOR URINE DRUG SCREEN Drug Class                     Cutoff (ng/mL) Amphetamine and metabolites    1000 Barbiturate and metabolites    200 Benzodiazepine                  818 Tricyclics and metabolites     300 Opiates and metabolites        300 Cocaine and metabolites        300 THC                            50 Performed at [facility], [place] [address] [place], [place] [address]   Magnesium     Status: None   Collection Time: 02/02/18 10:01 AM  Result Value Ref Range   Magnesium 1.9 1.7 - 2.4 mg/dL    Comment: Performed at [facility], [place] [address] [place], [place] [address]  I-stat troponin, ED     Status: None   Collection Time: 02/02/18 10:07 AM  Result Value Ref Range   Troponin i, poc 0.03 0.00 - 0.08 ng/mL   Comment 3  Comment: Due to the release kinetics of cTnI, a negative result within the first hours of the onset of symptoms does not rule out myocardial infarction with certainty. If myocardial infarction is still suspected, repeat the test at appropriate intervals.   POC CBG, ED     Status: Abnormal   Collection Time: 02/02/18 11:41 AM  Result Value Ref Range   Glucose-Capillary 250 (H) 65 - 99 mg/dL  Basic metabolic panel     Status: Abnormal   Collection Time: 02/02/18 12:02 PM  Result Value Ref Range   Sodium 137 135 - 145 mmol/L    Comment: DELTA CHECK NOTED   Potassium 2.8 (L) 3.5 - 5.1 mmol/L   Chloride 101 101 - 111 mmol/L   CO2 19 (L) 22 - 32 mmol/L   Glucose, Bld 275 (H) 65 - 99 mg/dL   BUN 14 6 - 20 mg/dL   Creatinine, Ser 0.60 0.44 - 1.00 mg/dL   Calcium 8.9 8.9 - 10.3 mg/dL   GFR calc non Af Amer >60 >60 mL/min   GFR calc Af Amer >60 >60 mL/min    Comment: (NOTE) The eGFR has been calculated using the CKD EPI equation. This calculation has not been validated in all clinical situations. eGFR's persistently <60 mL/min signify possible Chronic Kidney Disease.    Anion gap 17 (H) 5 - 15    Comment: Performed at [facility], [place] [address] [place], [place] [address]  POC CBG, ED     Status: Abnormal   Collection Time: 02/02/18  1:08 PM   Result Value Ref Range   Glucose-Capillary 191 (H) 65 - 99 mg/dL  CBG monitoring, ED     Status: Abnormal   Collection Time: 02/02/18  2:15 PM  Result Value Ref Range   Glucose-Capillary 190 (H) 65 - 99 mg/dL  CBG monitoring, ED     Status: Abnormal   Collection Time: 02/02/18  3:27 PM  Result Value Ref Range   Glucose-Capillary 176 (H) 65 - 99 mg/dL  Basic metabolic panel     Status: Abnormal   Collection Time: 02/02/18  3:46 PM  Result Value Ref Range   Sodium 137 135 - 145 mmol/L   Potassium 3.4 (L) 3.5 - 5.1 mmol/L    Comment: RESULT REPEATED AND VERIFIED DELTA CHECK NOTED    Chloride 105 101 - 111 mmol/L   CO2 21 (L) 22 - 32 mmol/L   Glucose, Bld 165 (H) 65 - 99 mg/dL   BUN 12 6 - 20 mg/dL   Creatinine, Ser 0.55 0.44 - 1.00 mg/dL   Calcium 8.7 (L) 8.9 - 10.3 mg/dL   GFR calc non Af Amer >60 >60 mL/min   GFR calc Af Amer >60 >60 mL/min    Comment: (NOTE) The eGFR has been calculated using the CKD EPI equation. This calculation has not been validated in all clinical situations. eGFR's persistently <60 mL/min signify possible Chronic Kidney Disease.    Anion gap 11 5 - 15    Comment: Performed at [facility], [place] [address], [place], [place] [address]  CBG monitoring, ED     Status: Abnormal   Collection Time: 02/02/18  4:38 PM  Result Value Ref Range   Glucose-Capillary 146 (H) 65 - 99 mg/dL  CBG monitoring, ED     Status: Abnormal   Collection Time: 02/02/18  6:32 PM  Result Value Ref Range   Glucose-Capillary 123 (H) 65 - 99 mg/dL  Basic metabolic panel  Status: Abnormal   Collection Time: 02/02/18  7:43 PM  Result Value Ref Range   Sodium 134 (L) 135 - 145 mmol/L   Potassium 3.4 (L) 3.5 - 5.1 mmol/L   Chloride 98 (L) 101 - 111 mmol/L   CO2 20 (L) 22 - 32 mmol/L   Glucose, Bld 282 (H) 65 - 99 mg/dL   BUN 11 6 - 20 mg/dL   Creatinine, Ser 0.56 0.44 - 1.00 mg/dL   Calcium 9.1 8.9 - 10.3 mg/dL   GFR calc non Af Amer >60 >60 mL/min   GFR  calc Af Amer >60 >60 mL/min    Comment: (NOTE) The eGFR has been calculated using the CKD EPI equation. This calculation has not been validated in all clinical situations. eGFR's persistently <60 mL/min signify possible Chronic Kidney Disease.    Anion gap 16 (H) 5 - 15    Comment: Performed at [facility], [place] [address] [place], [place] [address]  CBG monitoring, ED     Status: Abnormal   Collection Time: 02/02/18  7:47 PM  Result Value Ref Range   Glucose-Capillary 280 (H) 65 - 99 mg/dL  CBG monitoring, ED     Status: Abnormal   Collection Time: 02/02/18  8:56 PM  Result Value Ref Range   Glucose-Capillary 293 (H) 65 - 99 mg/dL   Dg Chest Port 1 View  Result Date: 02/02/2018 CLINICAL DATA:  Nausea and vomiting.  Chest discomfort for 2 days. EXAM: PORTABLE CHEST 1 VIEW COMPARISON:  None. FINDINGS: Normal heart size. Lungs clear. No pneumothorax. No pleural effusion. IMPRESSION: No active disease. Electronically Signed   By: Marybelle Killings M.D.   On: 02/02/2018 10:08    Pending Labs Unresulted Labs (From admission, onward)   Start     Ordered   02/02/18 1129  Hemoglobin A1c  Once,   R     02/02/18 1129   02/02/18 0037  Basic metabolic panel  STAT Now then every 4 hours ,   STAT     02/02/18 1129      Vitals/Pain Today's Vitals   02/02/18 1930 02/02/18 2000 02/02/18 2030 02/02/18 2130  BP: (!) 148/85 (!) 151/92 (!) 152/107 (!) 151/98  Pulse: (!) 101 99 (!) 106 93  Resp: _0 Temp:      TempSrc:      SpO2: 98% 97% 97% 96%  PainSc:        Isolation Precautions No active isolations  Medications Medications  atorvastatin (LIPITOR) tablet 80 mg (80 mg Oral Given 02/02/18 1904)  ezetimibe (ZETIA) tablet 10 mg (10 mg Oral Given 02/02/18 1333)  aspirin chewable tablet 81 mg (81 mg Oral Given 02/02/18 1332)  metoprolol succinate (TOPROL-XL) 24 hr tablet 25 mg (25 mg Oral Given 02/02/18 1333)  levothyroxine (SYNTHROID, LEVOTHROID) tablet 150 mcg (150  mcg Oral Given 02/02/18 1333)  losartan (COZAAR) tablet 25 mg (25 mg Oral Given 02/02/18 1333)  sertraline (ZOLOFT) tablet 50 mg (50 mg Oral Given 02/02/18 1333)  promethazine (PHENERGAN) suppository 25 mg (not administered)  Vitamin D (Ergocalciferol) (DRISDOL) capsule 50,000 Units (not administered)  pantoprazole (PROTONIX) EC tablet 40 mg (40 mg Oral Given 02/02/18 1333)  0.9 %  sodium chloride infusion ( Intravenous Stopped 02/02/18 1312)  metoCLOPramide (REGLAN) injection 10 mg (10 mg Intravenous Given 02/02/18 1330)  ondansetron (ZOFRAN) injection 4 mg (4 mg Intravenous Given 02/02/18 2103)  insulin regular (NOVOLIN R,HUMULIN R) 100 Units in sodium chloride 0.9 % 100 mL (  1 Units/mL) infusion (not administered)  dextrose 5 %-0.45 % sodium chloride infusion (not administered)  sodium chloride 0.9 % bolus 1,000 mL (0 mLs Intravenous Stopped 02/02/18 1001)  ondansetron (ZOFRAN) injection 4 mg (4 mg Intravenous Given 02/02/18 0755)  metoCLOPramide (REGLAN) injection 10 mg (10 mg Intravenous Given 02/02/18 0755)  sodium chloride 0.9 % bolus 1,000 mL (0 mLs Intravenous Stopped 02/02/18 1001)  potassium chloride 10 mEq in 100 mL IVPB (0 mEq Intravenous Stopped 02/02/18 1732)  insulin glargine (LANTUS) injection 22 Units (22 Units Subcutaneous Given 02/02/18 1803)    Mobility walks

## 2018-02-02 NOTE — H&P (Signed)
History and Physical    [name] [name] SEG:[id number] DOB: [birth year] DOA: 02/02/2018  PCP: Jinny Sanders, MD  Patient coming from: Home  Chief Complaint: Nausea and vomiting  HPI: [name] [name] is a 57 y.o. female with medical history significant of type 1 diabetes, gastroparesis, GERD, hypertension, chronic kidney disease comes in with over a day of persistent nausea and vomiting that is typical for her gastroparesis.  Patient denies any chest pain or abdominal pain.  She denies any fevers.  She denies any diarrhea.  She feels like she is dehydrated and also came to the ED for evaluation for possibly being in DKA.  She says her sugars have been high also at home.  Patient is being referred for admission for persistent nausea and vomiting along with DKA.  Review of Systems: As per HPI otherwise 10 point review of systems negative.   Past Medical History:  Diagnosis Date  . Anxiety   . B12 deficiency   . Chest pain 04/05/2017  . GERD (gastroesophageal reflux disease)   . Hyperlipidemia   . Hypertension    [name] [name]  . Hypothyroidism   . IBS (irritable bowel syndrome)   . Intermittent vertigo   . 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 Leg cramping    "@ night" (09/18/2013)  . Migraine    "once q couple months" (09/18/2013)  . Osteoporosis   . Peripheral neuropathy   . Type II diabetes mellitus ([place])   . Umbilical hernia    unrepaired (09/18/2013)    Past Surgical History:  Procedure Laterality Date  . CESAREAN SECTION  1989  . LAPAROSCOPIC ASSISTED VAGINAL HYSTERECTOMY  09/23/2012   Procedure: LAPAROSCOPIC ASSISTED VAGINAL HYSTERECTOMY;  Surgeon: Linda Hedges, DO;  Location: [facility];  Service: Gynecology;  Laterality: N/A;  pull Dr Gregor Hams instrument  . LEFT HEART CATH AND CORONARY ANGIOGRAPHY N/A 02/11/2017   Procedure: Left Heart Cath and Coronary Angiography;  Surgeon: Lorretta Harp, MD;  Location: [facility];  Service: Cardiovascular;   Laterality: N/A;  . TUBAL LIGATION Bilateral 1992  . VAGINAL HYSTERECTOMY  2013     reports that she quit smoking about 27 years ago. Her smoking use included cigarettes. She quit after 0.20 years of use. she has never used smokeless tobacco. She reports that she uses drugs. Drug: Marijuana. Frequency: 3.00 times per week. She reports that she does not drink alcohol.  Allergies  Allergen Reactions  . Codeine Hives, Anxiety and Other (See Comments)    Family History  Problem Relation Age of Onset  . Diabetes Other   . Heart disease Other   . Heart failure Mother   . Diabetes Maternal Grandmother   . Alzheimer's disease Maternal Grandmother   . Coronary artery disease Maternal Grandmother   . Heart attack Maternal Grandfather   . Heart failure Brother     Prior to Admission medications   Medication Sig Start Date End Date Taking? Authorizing Provider  aspirin 81 MG chewable tablet Chew 1 tablet (81 mg total) by mouth daily. 02/12/17  Yes Jani Gravel, MD  atorvastatin (LIPITOR) 80 MG tablet Take 1 tablet (80 mg total) by mouth daily at 6 PM. 09/04/16  Yes Bedsole, Amy E, MD  ezetimibe (ZETIA) 10 MG tablet Take 1 tablet (10 mg total) by mouth daily. 09/11/16  Yes Bedsole, Amy E, MD  Insulin Glargine (BASAGLAR KWIKPEN [place]) Inject 22 Units into the skin at bedtime.   Yes [provider]  insulin lispro (HUMALOG KWIKPEN) 100 UNIT/ML KiwkPen Inject into skin 3 times a day at meal time according to the following sliding scale: CBG 70-120 - 0 units / CBG 121-150 - 1 unit / CBG 151-200 - 2 units / CBG 201-250 - 3 units / CBG 251-300 - 5 units / CBG 301-350 - 7 units / CBG 351-400+ - 9 units  YOU MUST FOLLOW A STRICT DIABETIC DIET 09/23/17  Yes Cherene Altes, MD  levothyroxine (SYNTHROID, LEVOTHROID) 150 MCG tablet Take 1 tablet (150 mcg total) by mouth daily. 04/03/17  Yes Philemon Kingdom, MD  losartan (COZAAR) 25 MG tablet Take 25 mg by mouth daily. 05/27/17  Yes [provider]  metoCLOPramide (REGLAN) 5 MG tablet TAKE 1 TABLET BY MOUTH 4 TIMES A DAY BEFORE MEALS AND AT BEDTIME 01/21/18  Yes Bedsole, Amy E, MD  metoprolol succinate (TOPROL-XL) 25 MG 24 hr tablet Take 25 mg by mouth daily. 03/24/17  Yes [provider]  nitroGLYCERIN (NITRODUR - DOSED IN MG/24 HR) 0.2 mg/hr patch Place 1 patch (0.2 mg total) onto the skin daily as needed. Patient taking differently: Place 0.2 mg onto the skin daily.  12/14/17  Yes Regalado, Belkys A, MD  ondansetron (ZOFRAN) 4 MG tablet TAKE 1 TABLET BY MOUTH EVERY 6 HOURS AS NEEDED FOR NAUSEA AND VOMITING 12/20/17  Yes Bedsole, Amy E, MD  pantoprazole (PROTONIX) 40 MG tablet Take 1 tablet (40 mg total) by mouth daily at 12 noon. 01/28/18  Yes Bedsole, Amy E, MD  promethazine (PHENERGAN) 25 MG suppository Place 1 suppository (25 mg total) rectally every 6 (six) hours as needed for nausea or vomiting. 12/26/17  Yes Mackuen, Courteney Lyn, MD  RELPAX 40 MG tablet ONE TABLET BY MOUTH AT ONSET OF HEADACHE. MAY REPEAT IN 2 HOURS IF HEADACHE PERSISTS OR RECURS. 10/17/16  Yes Bedsole, Amy E, MD  sertraline (ZOLOFT) 50 MG tablet Take 1 tablet (50 mg total) by mouth daily. 12/16/17  Yes Bedsole, Amy E, MD  Vitamin D, Ergocalciferol, (DRISDOL) 50000 units CAPS capsule Take 1 capsule (50,000 Units total) by mouth every 7 (seven) days. 01/28/18  Yes Bedsole, Amy E, MD  Insulin Pen Needle (PEN NEEDLES) 31G X 8 MM MISC Use to inject insulin 4 times a day.  Dx: E11.10 12/23/17   Jinny Sanders, MD  Lancets (ONETOUCH ULTRASOFT) lancets Use to check blood sugar daily as needed.  Dx: E11.10 12/23/17   Jinny Sanders, MD  ONETOUCH VERIO test strip USE TO CHECK BLOOD SUGAR DAILY AS NEEDED. DX: E11.10 10/20/17   Jinny Sanders, MD    Physical Exam: Vitals:   02/02/18 0845 02/02/18 0922 02/02/18 0953 02/02/18 1100  BP: (!) 103/58 131/83 127/89 131/84  Pulse: (!) 111 (!) 113 (!) 114 (!) 114  Resp: (!) 27 17 16 19   Temp:      TempSrc:      SpO2:  96% 97% 100% 97%      Constitutional: NAD, calm, comfortable Vitals:   02/02/18 0845 02/02/18 0922 02/02/18 0953 02/02/18 1100  BP: (!) 103/58 131/83 127/89 131/84  Pulse: (!) 111 (!) 113 (!) 114 (!) 114  Resp: (!) 27 17 16 19   Temp:      TempSrc:      SpO2: 96% 97% 100% 97%   Eyes: PERRL, lids and conjunctivae normal ENMT: Mucous membranes are moist. Posterior pharynx clear of any exudate or lesions.Normal dentition.  Neck: normal, supple, no masses, no thyromegaly Respiratory: clear to auscultation  bilaterally, no wheezing, no crackles. Normal respiratory effort. No accessory muscle use.  Cardiovascular: Regular rate and rhythm, no murmurs / rubs / gallops. No extremity edema. 2+ pedal pulses. No carotid bruits.  Abdomen: no tenderness, no masses palpated. No hepatosplenomegaly. Bowel sounds positive.  Musculoskeletal: no clubbing / cyanosis. No joint deformity upper and lower extremities. Good ROM, no contractures. Normal muscle tone.  Skin: no rashes, lesions, ulcers. No induration Neurologic: CN 2-12 grossly intact. Sensation intact, DTR normal. Strength 5/5 in all 4.  Psychiatric: Normal judgment and insight. Alert and oriented x 3. Normal mood.    Labs on Admission: I have personally reviewed following labs and imaging studies  CBC: Recent Labs  Lab 02/02/18 0752  WBC 16.2*  HGB 16.6*  HCT 46.3*  MCV 82.1  PLT 616   Basic Metabolic Panel: Recent Labs  Lab 02/02/18 0752 02/02/18 1001  NA 130*  --   K 3.2*  --   CL 92*  --   CO2 13*  --   GLUCOSE 524*  --   BUN 16  --   CREATININE 0.82  --   CALCIUM 9.5  --   MG  --  1.9   GFR: Estimated Creatinine Clearance: 66 mL/min (by C-G formula based on SCr of 0.82 mg/dL). Liver Function Tests: No results for input(s): AST, ALT, ALKPHOS, BILITOT, PROT, ALBUMIN in the last 168 hours. No results for input(s): LIPASE, AMYLASE in the last 168 hours. No results for input(s): AMMONIA in the last 168 hours. Coagulation  Profile: No results for input(s): INR, PROTIME in the last 168 hours. Cardiac Enzymes: No results for input(s): CKTOTAL, CKMB, CKMBINDEX, TROPONINI in the last 168 hours. BNP (last 3 results) No results for input(s): PROBNP in the last 8760 hours. HbA1C: No results for input(s): HGBA1C in the last 72 hours. CBG: Recent Labs  Lab 02/02/18 0655 02/02/18 0939  GLUCAP 529* 342*   Lipid Profile: No results for input(s): CHOL, HDL, LDLCALC, TRIG, CHOLHDL, LDLDIRECT in the last 72 hours. Thyroid Function Tests: No results for input(s): TSH, T4TOTAL, FREET4, T3FREE, THYROIDAB in the last 72 hours. Anemia Panel: No results for input(s): VITAMINB12, FOLATE, FERRITIN, TIBC, IRON, RETICCTPCT in the last 72 hours. Urine analysis:    Component Value Date/Time   COLORURINE STRAW (A) 02/02/2018 0752   APPEARANCEUR CLEAR 02/02/2018 0752   LABSPEC 1.028 02/02/2018 0752   PHURINE 5.0 02/02/2018 0752   GLUCOSEU >=500 (A) 02/02/2018 0752   HGBUR MODERATE (A) 02/02/2018 0752   BILIRUBINUR NEGATIVE 02/02/2018 0752   BILIRUBINUR negative 09/11/2016 1038   KETONESUR 80 (A) 02/02/2018 0752   PROTEINUR 100 (A) 02/02/2018 0752   UROBILINOGEN 0.2 09/11/2016 1038   UROBILINOGEN 0.2 10/28/2013 1723   NITRITE NEGATIVE 02/02/2018 0752   LEUKOCYTESUR NEGATIVE 02/02/2018 0752   Sepsis Labs: !!!!!!!!!!!!!!!!!!!!!!!!!!!!!!!!!!!!!!!!!!!! @LABRCNTIP (procalcitonin:4,lacticidven:4) )No results found for this or any previous visit (from the past 240 hour(s)).   Radiological Exams on Admission: Dg Chest Port 1 View  Result Date: 02/02/2018 CLINICAL DATA:  Nausea and vomiting.  Chest discomfort for 2 days. EXAM: PORTABLE CHEST 1 VIEW COMPARISON:  None. FINDINGS: Normal heart size. Lungs clear. No pneumothorax. No pleural effusion. IMPRESSION: No active disease. Electronically Signed   By: Marybelle Killings M.D.   On: 02/02/2018 10:08    EKG: Independently reviewed.  Mild sinus tachycardia with no acute  changes Chest x-ray reviewed no edema or infiltrate Old chart reviewed Case discussed with Dr. Lacinda Axon  Assessment/Plan 57 year old female with DKA and gastroparesis  flare Principal Problem:   DKA (diabetic ketoacidoses) ([name])-placed on insulin drip and titrate as necessary.  Hourly glucose checks.  Every 4-hour BMP checks.  Aggressive IV fluids.  Continue insulin drip until her anion gap closes.  Currently around 24.  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

## 2018-02-02 NOTE — ED Triage Notes (Signed)
Patient here from home via EMS with complaints of nausea, vomiting, hyperglycemia. Hx of same.

## 2018-02-02 NOTE — ED Provider Notes (Signed)
[facility] DEPT Provider Note   CSN: [id number] Arrival date & time: 02/02/18  4010     History   Chief Complaint Chief Complaint  Patient presents with  . Nausea  . Emesis  . Hyperglycemia    HPI [name] [name] is a 57 y.o. female.  Level 5 caveat for urgent need for intervention.  Patient complains of nausea, vomiting, elevated glucose.  She is a type I diabetic.  She feels dehydrated.  No prodromal illnesses.  No fever, sweats, chills, dysuria, cough.      Past Medical History:  Diagnosis Date  . Anxiety   . B12 deficiency   . Chest pain 04/05/2017  . GERD (gastroesophageal reflux disease)   . Hyperlipidemia   . Hypertension    [name]  . Hypothyroidism   . IBS (irritable bowel syndrome)   . Intermittent vertigo   . 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 Leg cramping    "@ night" (09/18/2013)  . Migraine    "once q couple months" (09/18/2013)  . Osteoporosis   . Peripheral neuropathy   . Type II diabetes mellitus ([place])   . Umbilical hernia    unrepaired (09/18/2013)    Patient Active Problem List   Diagnosis Date Noted  . Chronic systolic heart failure ([place]) 01/14/2018  . Noncompliance 01/14/2018  . Migraines 12/11/2017  . DKA (diabetic ketoacidoses) ([place]) 12/11/2017  . Chest discomfort 09/18/2017  . DKA, type 1 ([place]) 09/17/2017  . Phrygian cap 06/20/2017  . Lower abdominal pain   . Nonischemic cardiomyopathy ([place]) 03/22/2017  . NSTEMI (non-ST elevated myocardial infarction) ([place]) 02/11/2017  . H. pylori infection 02/09/2017  . Hypomagnesemia 02/09/2017  . Gastroparesis due to DM ([place]) 01/22/2017  . Type 2 diabetes mellitus with ketoacidosis without coma, with long-term current use of insulin ([place]) 07/17/2016  . Atrial fibrillation ([place]) 02/01/2015  . 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 Hepatic lesion 09/18/2013  . Vitamin D deficiency 05/20/2012  . Viral  URI with cough 07/30/2011  . Essential hypertension 02/01/2011  . Osteoporosis 02/01/2011  . B12 deficiency 04/04/2010  . Peripheral neuropathy ([place]) 05/10/2009  . GERD 04/08/2009  . Generalized anxiety disorder 06/22/2008  . Hypothyroidism 04/29/2008  . Hyperlipidemia 04/29/2008  . Irritable bowel syndrome 04/29/2008    Past Surgical History:  Procedure Laterality Date  . CESAREAN SECTION  1989  . LAPAROSCOPIC ASSISTED VAGINAL HYSTERECTOMY  09/23/2012   Procedure: LAPAROSCOPIC ASSISTED VAGINAL HYSTERECTOMY;  Surgeon: Linda Hedges, DO;  Location: [facility];  Service: Gynecology;  Laterality: N/A;  pull Dr Gregor Hams instrument  . LEFT HEART CATH AND CORONARY ANGIOGRAPHY N/A 02/11/2017   Procedure: Left Heart Cath and Coronary Angiography;  Surgeon: Lorretta Harp, MD;  Location: [facility];  Service: Cardiovascular;  Laterality: N/A;  . TUBAL LIGATION Bilateral 1992  . VAGINAL HYSTERECTOMY  2013    OB History    Gravida Para Term Preterm AB Living   2 2           SAB TAB Ectopic Multiple Live Births                   Home Medications    Prior to Admission medications   Medication Sig Start Date End Date Taking? Authorizing Provider  aspirin 81 MG chewable tablet Chew 1 tablet (81 mg total) by mouth daily. 02/12/17   Jani Gravel, MD  atorvastatin (LIPITOR) 80 MG tablet Take 1  tablet (80 mg total) by mouth daily at 6 PM. 09/04/16   Bedsole, Amy E, MD  ezetimibe (ZETIA) 10 MG tablet Take 1 tablet (10 mg total) by mouth daily. 09/11/16   Bedsole, Amy E, MD  Insulin Glargine (BASAGLAR KWIKPEN [place]) Inject 22 Units into the skin at bedtime.    [provider]  insulin lispro (HUMALOG KWIKPEN) 100 UNIT/ML KiwkPen Inject into skin 3 times a day at meal time according to the following sliding scale: CBG 70-120 - 0 units / CBG 121-150 - 1 unit / CBG 151-200 - 2 units / CBG 201-250 - 3 units / CBG 251-300 - 5 units / CBG 301-350 - 7 units / CBG 351-400+ - 9 units  YOU MUST  FOLLOW A STRICT DIABETIC DIET 09/23/17   Cherene Altes, MD  Insulin Pen Needle (PEN NEEDLES) 31G X 8 MM MISC Use to inject insulin 4 times a day.  Dx: E11.10 12/23/17   Jinny Sanders, MD  Lancets (ONETOUCH ULTRASOFT) lancets Use to check blood sugar daily as needed.  Dx: E11.10 12/23/17   Jinny Sanders, MD  levothyroxine (SYNTHROID, LEVOTHROID) 150 MCG tablet Take 1 tablet (150 mcg total) by mouth daily. 04/03/17   Philemon Kingdom, MD  losartan (COZAAR) 25 MG tablet Take 25 mg by mouth daily. 05/27/17   [provider]  metoCLOPramide (REGLAN) 5 MG tablet TAKE 1 TABLET BY MOUTH 4 TIMES A DAY BEFORE MEALS AND AT BEDTIME 01/21/18   Bedsole, Amy E, MD  metoprolol succinate (TOPROL-XL) 25 MG 24 hr tablet Take 25 mg by mouth daily. 03/24/17   [provider]  nitroGLYCERIN (NITRODUR - DOSED IN MG/24 HR) 0.2 mg/hr patch Place 1 patch (0.2 mg total) onto the skin daily as needed. Patient taking differently: Place 0.2 mg onto the skin daily.  12/14/17   Regalado, Belkys A, MD  ondansetron (ZOFRAN) 4 MG tablet TAKE 1 TABLET BY MOUTH EVERY 6 HOURS AS NEEDED FOR NAUSEA AND VOMITING 12/20/17   Bedsole, Amy E, MD  ONETOUCH VERIO test strip USE TO CHECK BLOOD SUGAR DAILY AS NEEDED. DX: E11.10 10/20/17   Bedsole, Amy E, MD  pantoprazole (PROTONIX) 40 MG tablet Take 1 tablet (40 mg total) by mouth daily at 12 noon. 01/28/18   Bedsole, Amy E, MD  promethazine (PHENERGAN) 25 MG suppository Place 1 suppository (25 mg total) rectally every 6 (six) hours as needed for nausea or vomiting. 12/26/17   Mackuen, Courteney Lyn, MD  RELPAX 40 MG tablet ONE TABLET BY MOUTH AT ONSET OF HEADACHE. MAY REPEAT IN 2 HOURS IF HEADACHE PERSISTS OR RECURS. 10/17/16   Bedsole, Amy E, MD  sertraline (ZOLOFT) 50 MG tablet Take 1 tablet (50 mg total) by mouth daily. 12/16/17   Bedsole, Amy E, MD  Vitamin D, Ergocalciferol, (DRISDOL) 50000 units CAPS capsule Take 1 capsule (50,000 Units total) by mouth every 7 (seven) days.  01/28/18   Jinny Sanders, MD    Family History Family History  Problem Relation Age of Onset  . Diabetes Other   . Heart disease Other   . Heart failure Mother   . Diabetes Maternal Grandmother   . Alzheimer's disease Maternal Grandmother   . Coronary artery disease Maternal Grandmother   . Heart attack Maternal Grandfather   . Heart failure Brother     Social History Social History   Tobacco Use  . Smoking status: Former Smoker    Years: 0.20    Types: Cigarettes  Last attempt to quit: 12/10/1990    Years since quitting: 27.1  . Smokeless tobacco: Never Used  Substance Use Topics  . Alcohol use: No    Alcohol/week: 0.0 oz  . Drug use: Yes    Frequency: 3.0 times per week    Types: Marijuana     Allergies   Codeine   Review of Systems Review of Systems  Unable to perform ROS: Acuity of condition     Physical Exam Updated Vital Signs BP 131/83 (BP Location: Right Arm)   Pulse (!) 113   Temp 97.8 F (36.6 C) (Oral)   Resp 17   LMP 08/11/2012   SpO2 97%   Physical Exam  Constitutional: She is oriented to person, place, and time.  Dehydrated, nontoxic appearing.  HENT:  Head: Normocephalic and atraumatic.  Eyes: Conjunctivae are normal.  Neck: Neck supple.  Cardiovascular: Regular rhythm.  Tachycardic  Pulmonary/Chest: Effort normal and breath sounds normal.  Abdominal: Soft. Bowel sounds are normal.  Musculoskeletal: Normal range of motion.  Neurological: She is alert and oriented to person, place, and time.  Skin: Skin is warm and dry.  Psychiatric: She has a normal mood and affect. Her behavior is normal.  Nursing note and vitals reviewed.    ED Treatments / Results  Labs (all labs ordered are listed, but only abnormal results are displayed) Labs Reviewed  BASIC METABOLIC PANEL - Abnormal; Notable for the following components:      Result Value   Sodium 130 (*)    Potassium 3.2 (*)    Chloride 92 (*)    CO2 13 (*)    Glucose, Bld 524  (*)    Anion gap 25 (*)    All other components within normal limits  CBC - Abnormal; Notable for the following components:   WBC 16.2 (*)    RBC 5.64 (*)    Hemoglobin 16.6 (*)    HCT 46.3 (*)    All other components within normal limits  URINALYSIS, ROUTINE W REFLEX MICROSCOPIC - Abnormal; Notable for the following components:   Color, Urine STRAW (*)    Glucose, UA >=500 (*)    Hgb urine dipstick MODERATE (*)    Ketones, ur 80 (*)    Protein, ur 100 (*)    Squamous Epithelial / LPF 0-5 (*)    All other components within normal limits  CBG MONITORING, ED - Abnormal; Notable for the following components:   Glucose-Capillary 529 (*)    All other components within normal limits  CBG MONITORING, ED  I-STAT BETA HCG BLOOD, ED ([facility], [facility], [facility] ONLY)    EKG  EKG Interpretation None       Radiology No results found.  Procedures Procedures (including critical care time)  Medications Ordered in ED Medications  dextrose 5 %-0.45 % sodium chloride infusion ( Intravenous Hold 02/02/18 0925)  insulin regular (NOVOLIN R,HUMULIN R) 100 Units in sodium chloride 0.9 % 100 mL (1 Units/mL) infusion (not administered)  sodium chloride 0.9 % bolus 1,000 mL (1,000 mLs Intravenous New Bag/Given 02/02/18 0755)  ondansetron (ZOFRAN) injection 4 mg (4 mg Intravenous Given 02/02/18 0755)  metoCLOPramide (REGLAN) injection 10 mg (10 mg Intravenous Given 02/02/18 0755)  sodium chloride 0.9 % bolus 1,000 mL (1,000 mLs Intravenous New Bag/Given 02/02/18 0755)     Initial Impression / Assessment and Plan / ED Course  I have reviewed the triage vital signs and the nursing notes.  Pertinent labs & imaging results that were available during  my care of the patient were reviewed by me and considered in my medical decision making (see chart for details).     Type I diabetic presents with nausea, vomiting, dehydration.  Will vigorously hydrate, start IV insulin, Glucomander protocol, admit to general  medicine.   CRITICAL CARE Performed by: Nat Christen Total critical care time: 30 minutes Critical care time was exclusive of separately billable procedures and treating other patients. Critical care was necessary to treat or prevent imminent or life-threatening deterioration. Critical care was time spent personally by me on the following activities: development of treatment plan with patient and/or surrogate as well as nursing, discussions with consultants, evaluation of patient's response to treatment, examination of patient, obtaining history from patient or surrogate, ordering and performing treatments and interventions, ordering and review of laboratory studies, ordering and review of radiographic studies, pulse oximetry and re-evaluation of patient's condition.  Final Clinical Impressions(s) / ED Diagnoses   Final diagnoses:  Diabetic ketoacidosis without coma associated with type 1 diabetes mellitus [facility])    ED Discharge Orders    None       Nat Christen, MD 02/02/18 [phone]

## 2018-02-03 ENCOUNTER — Other Ambulatory Visit: Payer: Self-pay

## 2018-02-03 DIAGNOSIS — I420 Dilated cardiomyopathy: Secondary | ICD-10-CM | POA: Diagnosis present

## 2018-02-03 DIAGNOSIS — E1022 Type 1 diabetes mellitus with diabetic chronic kidney disease: Secondary | ICD-10-CM | POA: Diagnosis present

## 2018-02-03 DIAGNOSIS — I10 Essential (primary) hypertension: Secondary | ICD-10-CM | POA: Diagnosis not present

## 2018-02-03 DIAGNOSIS — Z7982 Long term (current) use of aspirin: Secondary | ICD-10-CM | POA: Diagnosis not present

## 2018-02-03 DIAGNOSIS — E1010 Type 1 diabetes mellitus with ketoacidosis without coma: Principal | ICD-10-CM

## 2018-02-03 DIAGNOSIS — R0789 Other chest pain: Secondary | ICD-10-CM | POA: Diagnosis not present

## 2018-02-03 DIAGNOSIS — E0810 Diabetes mellitus due to underlying condition with ketoacidosis without coma: Secondary | ICD-10-CM | POA: Diagnosis not present

## 2018-02-03 DIAGNOSIS — K589 Irritable bowel syndrome without diarrhea: Secondary | ICD-10-CM | POA: Diagnosis present

## 2018-02-03 DIAGNOSIS — M810 Age-related osteoporosis without current pathological fracture: Secondary | ICD-10-CM | POA: Diagnosis present

## 2018-02-03 DIAGNOSIS — E785 Hyperlipidemia, unspecified: Secondary | ICD-10-CM | POA: Diagnosis present

## 2018-02-03 DIAGNOSIS — E1043 Type 1 diabetes mellitus with diabetic autonomic (poly)neuropathy: Secondary | ICD-10-CM | POA: Diagnosis present

## 2018-02-03 DIAGNOSIS — E039 Hypothyroidism, unspecified: Secondary | ICD-10-CM | POA: Diagnosis present

## 2018-02-03 DIAGNOSIS — E1143 Type 2 diabetes mellitus with diabetic autonomic (poly)neuropathy: Secondary | ICD-10-CM

## 2018-02-03 DIAGNOSIS — R079 Chest pain, unspecified: Secondary | ICD-10-CM | POA: Diagnosis not present

## 2018-02-03 DIAGNOSIS — E1310 Other specified diabetes mellitus with ketoacidosis without coma: Secondary | ICD-10-CM | POA: Diagnosis not present

## 2018-02-03 DIAGNOSIS — N182 Chronic kidney disease, stage 2 (mild): Secondary | ICD-10-CM | POA: Diagnosis present

## 2018-02-03 DIAGNOSIS — Z87891 Personal history of nicotine dependence: Secondary | ICD-10-CM | POA: Diagnosis not present

## 2018-02-03 DIAGNOSIS — Z79899 Other long term (current) drug therapy: Secondary | ICD-10-CM | POA: Diagnosis not present

## 2018-02-03 DIAGNOSIS — E876 Hypokalemia: Secondary | ICD-10-CM | POA: Diagnosis present

## 2018-02-03 DIAGNOSIS — F411 Generalized anxiety disorder: Secondary | ICD-10-CM | POA: Diagnosis present

## 2018-02-03 DIAGNOSIS — Z885 Allergy status to narcotic agent status: Secondary | ICD-10-CM | POA: Diagnosis not present

## 2018-02-03 DIAGNOSIS — K3184 Gastroparesis: Secondary | ICD-10-CM

## 2018-02-03 DIAGNOSIS — I428 Other cardiomyopathies: Secondary | ICD-10-CM | POA: Diagnosis present

## 2018-02-03 DIAGNOSIS — K219 Gastro-esophageal reflux disease without esophagitis: Secondary | ICD-10-CM | POA: Diagnosis present

## 2018-02-03 DIAGNOSIS — I5022 Chronic systolic (congestive) heart failure: Secondary | ICD-10-CM | POA: Diagnosis present

## 2018-02-03 DIAGNOSIS — G629 Polyneuropathy, unspecified: Secondary | ICD-10-CM | POA: Diagnosis present

## 2018-02-03 DIAGNOSIS — I25119 Atherosclerotic heart disease of native coronary artery with unspecified angina pectoris: Secondary | ICD-10-CM | POA: Diagnosis present

## 2018-02-03 DIAGNOSIS — I130 Hypertensive heart and chronic kidney disease with heart failure and stage 1 through stage 4 chronic kidney disease, or unspecified chronic kidney disease: Secondary | ICD-10-CM | POA: Diagnosis present

## 2018-02-03 LAB — BASIC METABOLIC PANEL
Anion gap: 13 (ref 5–15)
Anion gap: 14 (ref 5–15)
BUN: 13 mg/dL (ref 6–20)
BUN: 11 mg/dL (ref 6–20)
CHLORIDE: 101 mmol/L (ref 101–111)
CO2: 22 mmol/L (ref 22–32)
CO2: 23 mmol/L (ref 22–32)
CREATININE: 0.56 mg/dL (ref 0.44–1.00)
Calcium: 9 mg/dL (ref 8.9–10.3)
Calcium: 9 mg/dL (ref 8.9–10.3)
Chloride: 98 mmol/L — ABNORMAL LOW (ref 101–111)
Creatinine, Ser: 0.52 mg/dL (ref 0.44–1.00)
GFR calc Af Amer: >60 mL/min (ref >60)
GFR calc Af Amer: >60 mL/min (ref >60)
GFR calc non Af Amer: >60 mL/min (ref >60)
GFR calc non Af Amer: >60 mL/min (ref >60)
Glucose, Bld: 199 mg/dL — ABNORMAL HIGH (ref 65–99)
Glucose, Bld: 172 mg/dL — ABNORMAL HIGH (ref 65–99)
Potassium: 2.4 mmol/L — CL (ref 3.5–5.1)
Potassium: 3.1 mmol/L — ABNORMAL LOW (ref 3.5–5.1)
Sodium: 136 mmol/L (ref 135–145)
Sodium: 135 mmol/L (ref 135–145)

## 2018-02-03 LAB — GLUCOSE, CAPILLARY
GLUCOSE-CAPILLARY: 113 mg/dL — AB (ref 65–99)
GLUCOSE-CAPILLARY: 139 mg/dL — AB (ref 65–99)
GLUCOSE-CAPILLARY: 161 mg/dL — AB (ref 65–99)
GLUCOSE-CAPILLARY: 184 mg/dL — AB (ref 65–99)
GLUCOSE-CAPILLARY: 230 mg/dL — AB (ref 65–99)
Glucose-Capillary: 119 mg/dL — ABNORMAL HIGH (ref 65–99)
Glucose-Capillary: 216 mg/dL — ABNORMAL HIGH (ref 65–99)

## 2018-02-03 LAB — TROPONIN I: Troponin I: <0.03 ng/mL (ref <0.03)

## 2018-02-03 LAB — MRSA PCR SCREENING: MRSA by PCR: NEGATIVE

## 2018-02-03 LAB — HEMOGLOBIN A1C
Hgb A1c MFr Bld: 12.7 % — ABNORMAL HIGH (ref 4.8–5.6)
Mean Plasma Glucose: 318 mg/dL

## 2018-02-03 MED ORDER — INSULIN ASPART 100 UNIT/ML ~~LOC~~ SOLN
0.0000 [IU] | SUBCUTANEOUS | Status: DC
  Administered 2018-02-03: 2 [IU] via SUBCUTANEOUS

## 2018-02-03 MED ORDER — ENOXAPARIN SODIUM 40 MG/0.4ML ~~LOC~~ SOLN
40.0000 mg | Freq: Every day | SUBCUTANEOUS | Status: DC
  Administered 2018-02-03: 40 mg via SUBCUTANEOUS
  Filled 2018-02-03 (×2): qty 0.4

## 2018-02-03 MED ORDER — INSULIN GLARGINE 100 UNIT/ML ~~LOC~~ SOLN
20.0000 [IU] | Freq: Every day | SUBCUTANEOUS | Status: DC
  Administered 2018-02-03: 20 [IU] via SUBCUTANEOUS
  Filled 2018-02-03 (×2): qty 0.2

## 2018-02-03 MED ORDER — POTASSIUM CHLORIDE 10 MEQ/100ML IV SOLN
10.0000 meq | INTRAVENOUS | Status: AC
  Administered 2018-02-03 (×2): 10 meq via INTRAVENOUS
  Filled 2018-02-03 (×3): qty 100

## 2018-02-03 MED ORDER — NITROGLYCERIN 0.2 MG/HR TD PT24
0.2000 mg | MEDICATED_PATCH | Freq: Every day | TRANSDERMAL | Status: DC
  Administered 2018-02-03 – 2018-02-04 (×2): 0.2 mg via TRANSDERMAL
  Filled 2018-02-03 (×2): qty 1

## 2018-02-03 MED ORDER — POTASSIUM CHLORIDE 10 MEQ/100ML IV SOLN
10.0000 meq | INTRAVENOUS | Status: AC
  Administered 2018-02-03 (×2): 10 meq via INTRAVENOUS
  Filled 2018-02-03: qty 100

## 2018-02-03 MED ORDER — INSULIN ASPART 100 UNIT/ML ~~LOC~~ SOLN
0.0000 [IU] | Freq: Three times a day (TID) | SUBCUTANEOUS | Status: DC
  Administered 2018-02-03: 5 [IU] via SUBCUTANEOUS
  Administered 2018-02-03: 3 [IU] via SUBCUTANEOUS
  Administered 2018-02-04: 15 [IU] via SUBCUTANEOUS
  Administered 2018-02-04: 5 [IU] via SUBCUTANEOUS

## 2018-02-03 MED ORDER — HYDRALAZINE HCL 25 MG PO TABS
25.0000 mg | ORAL_TABLET | Freq: Four times a day (QID) | ORAL | Status: DC | PRN
  Administered 2018-02-03 – 2018-02-04 (×2): 25 mg via ORAL
  Filled 2018-02-03 (×2): qty 1

## 2018-02-03 NOTE — Progress Notes (Signed)
[facility]  PROGRESS NOTE  NADEGE CARRIGER ZHY:[id number] DOB: 1961-01-02 DOA: 02/02/2018 PCP: Jinny Sanders, MD   Brief HPI:   57 y.o. female with medical history significant of type 1 diabetes, gastroparesis, GERD, hypertension, chronic kidney disease comes in with over a day of persistent nausea and vomiting that is typical for her gastroparesis.  Patient denies any chest pain or abdominal pain.  She denies any fevers.  She denies any diarrhea.  She feels like she is dehydrated and also came to the ED for evaluation for possibly being in DKA.  She says her sugars have been high also at home.  Patient is being referred for admission for persistent nausea and vomiting along with DKA.   Subjective   Patient seen and examined, complains of chest pain this morning. EKG showed T wave inversion in leads III and aVF   Assessment/Plan:     1. Chest pain-patient has history of CAD, takes Nitropaste 0.2 mg patch daily.  Will restart Nitropaste patch, continue aspirin 81 mg daily, troponin every 6 hours x3, cardiology consultation for further recommendations. 2. Diabetic ketoacidosis- resolved, patient is off IV insulin.  We will start Lantus 20 units subcu daily, sliding scale with NovoLog 3. Hypothyroidism-stable: Continue Synthroid 4. Hypertension-blood pressure stable, continue Toprol XL, Cozaar 5. Gastroparesis-continue 10 mg IV every 8 hours 6. GERD-continue Protonix     DVT prophylaxis: Lovenox  Code Status: Full code  Family Communication: No family at bedside  Disposition Plan: likely home when medically ready for discharge   Consultants:  None  Procedures:  None   Antibiotics:   Anti-infectives (From admission, onward)   None       Objective   Vitals:   02/03/18 0656 02/03/18 0800 02/03/18 0815 02/03/18 1000  BP:  (!) 162/92 (!) 152/95 (!) 155/124  Pulse:  (!) 103 98 94  Resp:  20 16 11   Temp: 98.4 F (36.9 C)     TempSrc: Oral     SpO2:  98%  98% 97%  Weight:      Height:        Intake/Output Summary (Last 24 hours) at 02/03/2018 1117 Last data filed at 02/03/2018 0800 Gross per 24 hour  Intake 839.33 ml  Output 600 ml  Net 239.33 ml   Filed Weights   02/02/18 2248  Weight: 61.4 kg (135 lb 5.8 oz)     Physical Examination:  Physical Exam: Eyes: No icterus, extraocular muscles intact  Mouth: Oral mucosa is moist, no lesions on palate,  Neck: Supple, no deformities, masses, or tenderness Lungs: Normal respiratory effort, bilateral clear to auscultation, no crackles or wheezes.  Heart: Regular rate and rhythm, S1 and S2 normal, no murmurs, rubs auscultated Abdomen: BS normoactive,soft,nondistended,non-tender to palpation,no organomegaly Extremities: No pretibial edema, no erythema, no cyanosis, no clubbing Neuro : Alert and oriented to time, place and person, No focal deficits Skin: No rashes seen on exam    Data Reviewed: I have personally reviewed following labs and imaging studies  CBG: Recent Labs  Lab 02/02/18 2329 02/03/18 0049 02/03/18 0152 02/03/18 0341 02/03/18 0803  GLUCAP 167* 119* 113* 139* 184*    CBC: Recent Labs  Lab 02/02/18 0752  WBC 16.2*  HGB 16.6*  HCT 46.3*  MCV 82.1  PLT 295    Basic Metabolic Panel: Recent Labs  Lab 02/02/18 1001 02/02/18 1202 02/02/18 1546 02/02/18 1943 02/02/18 2317 02/03/18 0554  NA  --  137 137 134* 136 135  K  --  2.8* 3.4* 3.4* 2.4* 3.1*  CL  --  101 105 98* 101 98*  CO2  --  19* 21* 20* 22 23  GLUCOSE  --  275* 165* 282* 199* 172*  BUN  --  14 12 11 13 11   CREATININE  --  0.60 0.55 0.56 0.56 0.52  CALCIUM  --  8.9 8.7* 9.1 9.0 9.0  MG 1.9  --   --   --   --   --     Recent Results (from the past 240 hour(s))  MRSA PCR Screening     Status: None   Collection Time: 02/02/18 10:00 PM  Result Value Ref Range Status   MRSA by PCR NEGATIVE NEGATIVE Final    Comment:        The GeneXpert MRSA Assay (FDA approved for NASAL  specimens only), is one component of a comprehensive MRSA colonization surveillance program. It is not intended to diagnose MRSA infection nor to guide or monitor treatment for MRSA infections. Performed at [facility], [place] [address] [place], [place] [address]      Liver Function Tests: Recent Labs  Lab 02/02/18 0951  AST 20  ALT 16  ALKPHOS 112  BILITOT 1.2  PROT 8.4*  ALBUMIN 4.8   Recent Labs  Lab 02/02/18 0951  LIPASE 69*   No results for input(s): AMMONIA in the last 168 hours.  Cardiac Enzymes: Recent Labs  Lab 02/03/18 0959  TROPONINI <0.03   BNP (last 3 results) No results for input(s): BNP in the last 8760 hours.  ProBNP (last 3 results) No results for input(s): PROBNP in the last 8760 hours.    Studies: Dg Chest Port 1 View  Result Date: 02/02/2018 CLINICAL DATA:  Nausea and vomiting.  Chest discomfort for 2 days. EXAM: PORTABLE CHEST 1 VIEW COMPARISON:  None. FINDINGS: Normal heart size. Lungs clear. No pneumothorax. No pleural effusion. IMPRESSION: No active disease. Electronically Signed   By: Marybelle Killings M.D.   On: 02/02/2018 10:08    Scheduled Meds: . aspirin  81 mg Oral Daily  . atorvastatin  80 mg Oral q1800  . ezetimibe  10 mg Oral Daily  . insulin aspart  0-15 Units Subcutaneous TID WC  . insulin glargine  20 Units Subcutaneous QHS  . levothyroxine  150 mcg Oral QAC breakfast  . losartan  25 mg Oral Daily  . metoCLOPramide (REGLAN) injection  10 mg Intravenous Q8H  . metoprolol succinate  25 mg Oral Daily  . nitroGLYCERIN  0.2 mg Transdermal Daily  . pantoprazole  40 mg Oral Q1200  . sertraline  50 mg Oral Daily  . [START ON 02/04/2018] Vitamin D (Ergocalciferol)  50,000 Units Oral Q7 days      Time spent: 25 min  [facility] Pager [phone]. If 7PM-7AM, please contact night-coverage at [email or web address], Office  [phone]  password [email or web address]  02/03/2018, 11:17 AM  LOS: 0 days

## 2018-02-03 NOTE — Progress Notes (Addendum)
Inpatient Diabetes Program Recommendations  AACE/ADA: New Consensus Statement on Inpatient Glycemic Control (2015)  Target Ranges:  Prepandial:   less than 140 mg/dL      Peak postprandial:   less than 180 mg/dL (1-2 hours)      Critically ill patients:  140 - 180 mg/dL   Results for [name], [name] (MRN [id number]) as of 02/03/2018 10:19  Ref. Range 02/02/2018 07:52  Sodium Latest Ref Range: 135 - 145 mmol/L 130 (L)  Potassium Latest Ref Range: 3.5 - 5.1 mmol/L 3.2 (L)  Chloride Latest Ref Range: 101 - 111 mmol/L 92 (L)  CO2 Latest Ref Range: 22 - 32 mmol/L 13 (L)  Glucose Latest Ref Range: 65 - 99 mg/dL 524 (HH)  BUN Latest Ref Range: 6 - 20 mg/dL 16  Creatinine Latest Ref Range: 0.44 - 1.00 mg/dL 0.82  Calcium Latest Ref Range: 8.9 - 10.3 mg/dL 9.5  Anion gap Latest Ref Range: 5 - 15  25 (H)    Results for [name], [name] (MRN [id number]) as of 02/03/2018 10:19  Ref. Range 02/02/2018 23:29 02/03/2018 00:49 02/03/2018 01:52 02/03/2018 03:41 02/03/2018 08:03  Glucose-Capillary Latest Ref Range: 65 - 99 mg/dL 167 (H) 119 (H) 113 (H) 139 (H) 184 (H)   Results for [name] [name], [name] [name] (MRN [id number]) as of 02/03/2018 15:43  Ref. Range 09/18/2017 05:33 12/11/2017 14:16 02/02/2018 12:02  Hemoglobin A1C Latest Ref Range: 4.8 - 5.6 % 10.2 (H) 11.1 (H) 12.7 (H)  318 mg/dl average glucose     Admit with: DKA  History: Type 1 DM, Gastroparesis  Home DM Meds: Basaglar 22 units QHS       Humalog 0-9 units TID per SSI  Current Insulin Orders: Lantus 20 units QHS       Novolog Moderate Correction Scale/ SSI (0-15 units) TID AC        Per PCP notes (Dr. Eliezer Lofts with [facility]) patient was discharged by her endocrinologist due to no shows back on 10/02/17.  Transitioned off the IV insulin drip yesterday at 6pm.  CBGs stable since Midnight.    MD- Please consider starting Novolog Meal Coverage: Novolog 3 units TID with meals (hold if pt eats <50% of meal)      --Will follow patient  during hospitalization--  Wyn Quaker RN, MSN, CDE Diabetes Coordinator Inpatient Glycemic Control Team Team Pager: [phone] (8a-5p)

## 2018-02-03 NOTE — Progress Notes (Signed)
Pt. complains of chest pain rating her pain level as 8/10. Pain is crushing type in nature, localized mainly to upper mid-chest region. HR 118. Patient had also been complaining of nausea ( assoc with her DKA diagnosis). Pain does not radiate. EKG- T wave abnormality. Pt. mentioned that she usually wears a Nitroglycerin patch, but has not had one on since Saturday. MD notified. Troponin=<0.03. Nitroglycerin patch applied per MD order. Will continue to monitor.

## 2018-02-03 NOTE — Plan of Care (Signed)
  Education: Knowledge of General Education information will improve 02/03/2018 1002 - Progressing by Staci Righter, RN   Health Behavior/Discharge Planning: Ability to manage health-related needs will improve 02/03/2018 1002 - Progressing by Staci Righter, RN   Clinical Measurements: Ability to maintain clinical measurements within normal limits will improve 02/03/2018 1002 - Progressing by Staci Righter, RN Will remain free from infection 02/03/2018 1002 - Progressing by Staci Righter, RN Diagnostic test results will improve 02/03/2018 1002 - Progressing by Staci Righter, RN Respiratory complications will improve 02/03/2018 1002 - Progressing by Staci Righter, RN Cardiovascular complication will be avoided 02/03/2018 1002 - Progressing by Staci Righter, RN   Activity: Risk for activity intolerance will decrease 02/03/2018 1002 - Progressing by Staci Righter, RN   Nutrition: Adequate nutrition will be maintained 02/03/2018 1002 - Progressing by Staci Righter, RN   Coping: Level of anxiety will decrease 02/03/2018 1002 - Progressing by Staci Righter, RN

## 2018-02-03 NOTE — Progress Notes (Signed)
CRITICAL VALUE ALERT  Critical Value:  K+ 2.4  Date & Time Notied:  02/03/18 @ 0119  Provider Notified: paged at [email or web address] Received/Actions taken: see orders

## 2018-02-03 NOTE — Consult Note (Addendum)
The patient has been seen in conjunction with Daune Perch, NP-C. All aspects of care have been considered and discussed. The patient has been personally interviewed, examined, and all clinical data has been reviewed.   Overall improving.  Not complaining of chest discomfort at the time of my evaluation.  Complaining of persisting nausea and no significant improvement with Zofran.  She prefers Phenergan.  Exam is unremarkable with the exception of elevated blood pressure 180/70 mmHg and heart rate 105 bpm.  No gallop.  Lungs clear.  Lying on right side.  No peripheral edema.  EKG reveals sinus tachycardia and no evidence of acute ischemia.  Recent cath within the past 12 months demonstrated normal coronary arteries as it did in 2012.  Suspect chest discomfort is either musculoskeletal or esophageal.  No invasive evaluation indicated.  It is appropriate to cycle cardiac markers.  Known to have nonischemic cardiomyopathy, HFrEF, therefore requiring guideline based management that should include ARB/ACE and appropriate beta-blocker therapy titrated as tolerated.  Cardiology Consultation:   Patient ID: [name] [name]; [id number]; 1961/06/11   Admit date: 02/02/2018 Date of Consult: 02/03/2018  Primary Care Provider: Jinny Sanders, MD Primary Cardiologist: Minus Breeding, MD    Patient Profile:   [name] [name] is a 57 y.o. female with a hx of atrial fibrillation 2012 with no reoccurrences, dilated cardiomyopathy EF 30-35%, Type 1 DM, gastroparesis, GERD, HTN, CKD,  who is being seen today for the evaluation of chest pain at the request of Dr. Darrick Meigs.  History of Present Illness:   Ms. Tait has a history of atrial fibrillation in 2012, abnormal stress test follow by cardiac cath that showed normal coronary arteries, EF 55%. She was seen in the office by Dr. Percival Spanish in 02/2015 for chest pain and an exercise stress test was ordered and was negative for ischemia (per notes, I am unable to  find it). She wore a cardiac monitor in 03/2015 that did not show afib and anticoagulation was discontinued. It was thought that her afib was related to thyroid problems that she was having at the time and were subsequently corrected.   In march 2018 she was admitted for chest pain an d found to have elevated troponin, up to 0.31.  An echo in 02/2017 showed EF 35-40% with Hypokinesis of the mid-apicalanterior, lateral, and inferior myocardium. Cardiac catheterization was done that showed normal coronary arteries with EF 45-50%. In January she was seen in the hospital with elevated troponin of 0.09 she was noted to have poorly controlled blood sugars and there was a question of compliance with her ace inhibitors and beta blocker. She has some chronic atypical chest pain. She also had elevated troponin of 0.68 in 09/2017 while hospitalized fro possible pneumonia, hypokalemia, DKA.  She was last seen in the office by Dr. Percival Spanish on 01/07/18 at which time she was doing well, noted to be euvolemic. BP would not allow for titration of meds. She was noted to have chronic noncardiac chest pain. Poorly controlled DM with A1c 11.1. No changes were made.   The patient was admitted to [facility] on 02/02/18 for DKA. She complained of chest pain to the staff and we were called. She has chronic non-cardiac chest pain. She has prn Nitropatches at home which she wears every day. Her current pain is similar to her usual pain. SHe is very tender to touch in the sternal area. She has occ pain with deep breath but no overt shortness of breath and no  recent fluid overload. She states compliance with her home cardiac meds.  .   Past Medical History:  Diagnosis Date  . Anxiety   . B12 deficiency   . Chest pain 04/05/2017  . GERD (gastroesophageal reflux disease)   . Hyperlipidemia   . Hypertension    [name] [name]  . Hypothyroidism   . IBS (irritable bowel syndrome)   . Intermittent vertigo   . 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 Leg cramping    "@ night" (09/18/2013)  . Migraine    "once q couple months" (09/18/2013)  . Osteoporosis   . Peripheral neuropathy   . Type II diabetes mellitus ([place])   . Umbilical hernia    unrepaired (09/18/2013)    Past Surgical History:  Procedure Laterality Date  . CESAREAN SECTION  1989  . LAPAROSCOPIC ASSISTED VAGINAL HYSTERECTOMY  09/23/2012   Procedure: LAPAROSCOPIC ASSISTED VAGINAL HYSTERECTOMY;  Surgeon: Linda Hedges, DO;  Location: [facility];  Service: Gynecology;  Laterality: N/A;  pull Dr Gregor Hams instrument  . LEFT HEART CATH AND CORONARY ANGIOGRAPHY N/A 02/11/2017   Procedure: Left Heart Cath and Coronary Angiography;  Surgeon: Lorretta Harp, MD;  Location: [facility];  Service: Cardiovascular;  Laterality: N/A;  . TUBAL LIGATION Bilateral 1992  . VAGINAL HYSTERECTOMY  2013     Home Medications:  Prior to Admission medications   Medication Sig Start Date End Date Taking? Authorizing Provider  aspirin 81 MG chewable tablet Chew 1 tablet (81 mg total) by mouth daily. 02/12/17  Yes Jani Gravel, MD  atorvastatin (LIPITOR) 80 MG tablet Take 1 tablet (80 mg total) by mouth daily at 6 PM. 09/04/16  Yes Bedsole, Amy E, MD  ezetimibe (ZETIA) 10 MG tablet Take 1 tablet (10 mg total) by mouth daily. 09/11/16  Yes Bedsole, Amy E, MD  Insulin Glargine (BASAGLAR KWIKPEN [place]) Inject 22 Units into the skin at bedtime.   Yes [provider]  insulin lispro (HUMALOG KWIKPEN) 100 UNIT/ML KiwkPen Inject into skin 3 times a day at meal time according to the following sliding scale: CBG 70-120 - 0 units / CBG 121-150 - 1 unit / CBG 151-200 - 2 units / CBG 201-250 - 3 units / CBG 251-300 - 5 units / CBG 301-350 - 7 units / CBG 351-400+ - 9 units  YOU MUST FOLLOW A STRICT DIABETIC DIET 09/23/17  Yes Cherene Altes, MD  levothyroxine (SYNTHROID, LEVOTHROID) 150 MCG tablet Take 1 tablet (150 mcg total) by mouth daily. 04/03/17  Yes Philemon Kingdom, MD  losartan (COZAAR) 25 MG tablet Take 25 mg by mouth daily. 05/27/17  Yes [provider]  metoCLOPramide (REGLAN) 5 MG tablet TAKE 1 TABLET BY MOUTH 4 TIMES A DAY BEFORE MEALS AND AT BEDTIME 01/21/18  Yes Bedsole, Amy E, MD  metoprolol succinate (TOPROL-XL) 25 MG 24 hr tablet Take 25 mg by mouth daily. 03/24/17  Yes [provider]  nitroGLYCERIN (NITRODUR - DOSED IN MG/24 HR) 0.2 mg/hr patch Place 1 patch (0.2 mg total) onto the skin daily as needed. Patient taking differently: Place 0.2 mg onto the skin daily.  12/14/17  Yes Regalado, Belkys A, MD  ondansetron (ZOFRAN) 4 MG tablet TAKE 1 TABLET BY MOUTH EVERY 6 HOURS AS NEEDED FOR NAUSEA AND VOMITING 12/20/17  Yes Bedsole, Amy E, MD  pantoprazole (PROTONIX) 40 MG tablet Take 1 tablet (40 mg total) by mouth daily at 12 noon. 01/28/18  Yes Jinny Sanders, MD  promethazine (PHENERGAN) 25 MG suppository Place 1 suppository (25 mg total) rectally every 6 (six) hours as needed for nausea or vomiting. 12/26/17  Yes Mackuen, Courteney Lyn, MD  RELPAX 40 MG tablet ONE TABLET BY MOUTH AT ONSET OF HEADACHE. MAY REPEAT IN 2 HOURS IF HEADACHE PERSISTS OR RECURS. 10/17/16  Yes Bedsole, Amy E, MD  sertraline (ZOLOFT) 50 MG tablet Take 1 tablet (50 mg total) by mouth daily. 12/16/17  Yes Bedsole, Amy E, MD  Vitamin D, Ergocalciferol, (DRISDOL) 50000 units CAPS capsule Take 1 capsule (50,000 Units total) by mouth every 7 (seven) days. 01/28/18  Yes Bedsole, Amy E, MD  Insulin Pen Needle (PEN NEEDLES) 31G X 8 MM MISC Use to inject insulin 4 times a day.  Dx: E11.10 12/23/17   Jinny Sanders, MD  Lancets (ONETOUCH ULTRASOFT) lancets Use to check blood sugar daily as needed.  Dx: E11.10 12/23/17   Jinny Sanders, MD  ONETOUCH VERIO test strip USE TO CHECK BLOOD SUGAR DAILY AS NEEDED. DX: E11.10 10/20/17   Jinny Sanders, MD    Inpatient Medications: Scheduled Meds: . aspirin  81 mg Oral Daily  . atorvastatin  80 mg Oral q1800  . ezetimibe  10 mg  Oral Daily  . insulin aspart  0-15 Units Subcutaneous TID WC  . insulin glargine  20 Units Subcutaneous QHS  . levothyroxine  150 mcg Oral QAC breakfast  . losartan  25 mg Oral Daily  . metoCLOPramide (REGLAN) injection  10 mg Intravenous Q8H  . metoprolol succinate  25 mg Oral Daily  . nitroGLYCERIN  0.2 mg Transdermal Daily  . pantoprazole  40 mg Oral Q1200  . sertraline  50 mg Oral Daily  . [START ON 02/04/2018] Vitamin D (Ergocalciferol)  50,000 Units Oral Q7 days   Continuous Infusions: . sodium chloride 20 mL/hr at 02/03/18 0154  . potassium chloride 10 mEq (02/03/18 0929)   PRN Meds: ondansetron (ZOFRAN) IV, promethazine  Allergies:    Allergies  Allergen Reactions  . Codeine Hives, Anxiety and Other (See Comments)    Social History:   Social History   Socioeconomic History  . Marital status: Married    Spouse name: Not on file  . Number of children: 2  . Years of education: Not on file  . Highest education level: Not on file  Social Needs  . Financial resource strain: Not on file  . Food insecurity - worry: Not on file  . Food insecurity - inability: Not on file  . Transportation needs - medical: Not on file  . Transportation needs - non-medical: Not on file  Occupational History  . Occupation: UNEMPLOYED    Fish farm manager: UNEMPLOYED    Comment: homemaker  Tobacco Use  . Smoking status: Former Smoker    Years: 0.20    Types: Cigarettes    Last attempt to quit: 12/10/1990    Years since quitting: 27.1  . Smokeless tobacco: Never Used  Substance and Sexual Activity  . Alcohol use: No    Alcohol/week: 0.0 oz  . Drug use: Yes    Frequency: 3.0 times per week    Types: Marijuana  . Sexual activity: Yes  Other Topics Concern  . Not on file  Social History Narrative   Regular exercise- no    Diet- lots of mountain dew, limits fast foods     Family History:    Family History  Problem Relation Age of Onset  . Diabetes Other   . Heart disease Other   .  Heart failure Mother   . Diabetes Maternal Grandmother   . Alzheimer's disease Maternal Grandmother   . Coronary artery disease Maternal Grandmother   . Heart attack Maternal Grandfather   . Heart failure Brother      ROS:  Please see the history of present illness.   All other ROS reviewed and negative.     Physical Exam/Data:   Vitals:   02/03/18 0600 02/03/18 0656 02/03/18 0800 02/03/18 0815  BP: (!) 151/88  (!) 162/92 (!) 152/95  Pulse: 94  (!) 103 98  Resp: 15  20 16   Temp:  98.4 F (36.9 C)    TempSrc:  Oral    SpO2: 98%  98% 98%  Weight:      Height:        Intake/Output Summary (Last 24 hours) at 02/03/2018 1035 Last data filed at 02/03/2018 0800 Gross per 24 hour  Intake 839.33 ml  Output 600 ml  Net 239.33 ml   Filed Weights   02/02/18 2248  Weight: 135 lb 5.8 oz (61.4 kg)   Body mass index is 25.58 kg/m.  General:  Chronically ill appearing female, in no acute distress HEENT: normal Lymph: no adenopathy Neck: no JVD Endocrine:  No thryomegaly Vascular: No carotid bruits; FA pulses 2+ bilaterally without bruits  Cardiac:  normal S1, S2; RRR; no murmur  Lungs:  clear to auscultation bilaterally, no wheezing, rhonchi or rales  Abd: soft, nontender, no hepatomegaly  Ext: no edema Musculoskeletal:  No deformities, BUE and BLE strength normal and equal Skin: warm and dry  Neuro:  CNs 2-12 intact, no focal abnormalities noted Psych:  Normal affect   EKG:  The EKG was personally reviewed and demonstrates:  Sinus tachycardia, 107 bpm, Rightward axis, Nonspecific T wave abnormality V2-3 Telemetry:  Telemetry was personally reviewed and demonstrates:  sinus rhythm in the 90's with occ PVCs  Relevant CV Studies:  Echocardiogram 09/20/2017 Study Conclusions - Left ventricle: The cavity size was normal. Systolic function was moderately to severely reduced. The estimated ejection fraction was in the range of 30% to 35%. Moderate diffuse hypokinesis with  regional variations. Due to tachycardia, there was fusion of early and atrial contributions to ventricular filling. The study is not technically sufficient to allow evaluation of LV diastolic function. - Mitral valve: There was mild regurgitation.  Impressions: - The wall motion abnormality has a similar distribution to March   2018, but is more severe. It does not follow typical coronary   distribution and a nonischemic cardiomyopathy is suspected.   Overall LVEF has deteriorated. The regional wall motion   abnormality distribution is suggestive of stress cardiomyopathy,   but this is not a chronic disorder and is only infrequently a   recurrent disorder.  Left Heart Cath and Coronary Angiography  02/11/17  Conclusion    There is mild left ventricular systolic dysfunction.  LV end diastolic pressure is mildly elevated.  The left ventricular ejection fraction is 45-50% by visual estimate.       Laboratory Data:  Chemistry Recent Labs  Lab 02/02/18 1943 02/02/18 2317 02/03/18 0554  NA 134* 136 135  K 3.4* 2.4* 3.1*  CL 98* 101 98*  CO2 20* 22 23  GLUCOSE 282* 199* 172*  BUN 11 13 11   CREATININE 0.56 0.56 0.52  CALCIUM 9.1 9.0 9.0  GFRNONAA >60 >60 >60  GFRAA >60 >60 >60  ANIONGAP 16* 13 14    Recent Labs  Lab 02/02/18 0951  PROT 8.4*  ALBUMIN 4.8  AST 20  ALT 16  ALKPHOS 112  BILITOT 1.2   Hematology Recent Labs  Lab 02/02/18 0752  WBC 16.2*  RBC 5.64*  HGB 16.6*  HCT 46.3*  MCV 82.1  MCH 29.4  MCHC 35.9  RDW 14.3  PLT 365   Cardiac EnzymesNo results for input(s): TROPONINI in the last 168 hours.  Recent Labs  Lab 02/02/18 1007  TROPIPOC 0.03    BNPNo results for input(s): BNP, PROBNP in the last 168 hours.  DDimer No results for input(s): DDIMER in the last 168 hours.  Radiology/Studies:  Dg Chest Port 1 View  Result Date: 02/02/2018 CLINICAL DATA:  Nausea and vomiting.  Chest discomfort for 2 days. EXAM: PORTABLE CHEST 1 VIEW  COMPARISON:  None. FINDINGS: Normal heart size. Lungs clear. No pneumothorax. No pleural effusion. IMPRESSION: No active disease. Electronically Signed   By: Marybelle Killings M.D.   On: 02/02/2018 10:08    Assessment and Plan:   Chest pain -Pt with hx of chronic non-cardiac chest pain. Normal coronary arteries by cath in 02/2017. Decreased LV function with EF 30-35% by echo in 09/2017. -Her pain is similar to her usual pain. Very tender to touch in her sternal area. No shortness of breath.  -Mild ST changes in V2-3, sinus tach -First troponin negative. Cycle troponins. Continue BB -With her atypical chest pain, negative troponin and recent normal cath, this is unlikely ACS. Will discuss with Dr. Tamala Julian.   DKA in pt with Type 1 DM -Management per IM. On Insulin drip and IV fluids.   Dilated cardiomyopathy -Normal coronary arteries by cath in 02/2017. EF 35-40% by echo in 02/2017, EF 30-35% by echo in 09/2017. -Home meds include losartan 25 mg daily, Toprol 25 mg daily, Nitropatch as needed. Unable to titrate meds due to soft BP per recent office note.   Hypokalemia -Management with DKA treatment  Hyperlipidemia -continue statin and zetia  For questions or updates, please contact [place] Please consult [email or web address] for contact info under Cardiology/STEMI.   Signed, Daune Perch, NP  02/03/2018 10:35 AM

## 2018-02-04 DIAGNOSIS — I5022 Chronic systolic (congestive) heart failure: Secondary | ICD-10-CM

## 2018-02-04 DIAGNOSIS — E0810 Diabetes mellitus due to underlying condition with ketoacidosis without coma: Secondary | ICD-10-CM

## 2018-02-04 LAB — GLUCOSE, CAPILLARY
GLUCOSE-CAPILLARY: 311 mg/dL — AB (ref 65–99)
GLUCOSE-CAPILLARY: 226 mg/dL — AB (ref 65–99)
GLUCOSE-CAPILLARY: 169 mg/dL — AB (ref 65–99)
Glucose-Capillary: 317 mg/dL — ABNORMAL HIGH (ref 65–99)
Glucose-Capillary: 376 mg/dL — ABNORMAL HIGH (ref 65–99)

## 2018-02-04 LAB — BASIC METABOLIC PANEL
ANION GAP: 14 (ref 5–15)
BUN: 22 mg/dL — AB (ref 6–20)
CHLORIDE: 100 mmol/L — AB (ref 101–111)
CO2: 16 mmol/L — AB (ref 22–32)
Calcium: 8.4 mg/dL — ABNORMAL LOW (ref 8.9–10.3)
Creatinine, Ser: 0.64 mg/dL (ref 0.44–1.00)
GFR calc Af Amer: >60 mL/min (ref >60)
GLUCOSE: 347 mg/dL — AB (ref 65–99)
POTASSIUM: 3.8 mmol/L (ref 3.5–5.1)
Sodium: 130 mmol/L — ABNORMAL LOW (ref 135–145)

## 2018-02-04 MED ORDER — BASAGLAR KWIKPEN 100 UNIT/ML ~~LOC~~ SOPN: 22.0000 [IU] | PEN_INJECTOR | Freq: Every day | SUBCUTANEOUS | 2 refills | Status: DC

## 2018-02-04 MED ORDER — IBUPROFEN 200 MG PO TABS
600.0000 mg | ORAL_TABLET | Freq: Once | ORAL | Status: AC
  Administered 2018-02-04: 600 mg via ORAL
  Filled 2018-02-04: qty 3

## 2018-02-04 MED ORDER — OXYCODONE HCL 5 MG PO TABS
5.0000 mg | ORAL_TABLET | Freq: Four times a day (QID) | ORAL | Status: DC | PRN
  Administered 2018-02-04: 5 mg via ORAL
  Filled 2018-02-04: qty 1

## 2018-02-04 MED ORDER — INSULIN LISPRO 100 UNIT/ML (KWIKPEN): PEN_INJECTOR | SUBCUTANEOUS | 0 refills | Status: DC

## 2018-02-04 NOTE — Progress Notes (Addendum)
Inpatient Diabetes Program Recommendations  AACE/ADA: New Consensus Statement on Inpatient Glycemic Control (2015)  Target Ranges:  Prepandial:   less than 140 mg/dL      Peak postprandial:   less than 180 mg/dL (1-2 hours)      Critically ill patients:  140 - 180 mg/dL   Results for RENLEE, FLOOR (MRN [id number]) as of 02/04/2018 09:24  Ref. Range 02/03/2018 01:52 02/03/2018 03:41 02/03/2018 08:03 02/03/2018 11:39 02/03/2018 19:44  Glucose-Capillary Latest Ref Range: 65 - 99 mg/dL 113 (H) 139 (H)  2 units Novolog 184 (H)  20 units Lantus 216 (H)  5 units Novolog 161 (H)   Results for MALETA, PACHA (MRN [id number]) as of 02/04/2018 09:24  Ref. Range 02/03/2018 23:42 02/04/2018 03:17 02/04/2018 08:32  Glucose-Capillary Latest Ref Range: 65 - 99 mg/dL 230 (H) 311 (H) 376 (H)  15 units Novolog    Home DM Meds: Basaglar 22 units QHS                             Humalog 0-9 units TID per SSI  Current Insulin Orders: Lantus 20 units QHS                                       Novolog Moderate Correction Scale/ SSI (0-15 units) TID AC      Per PCP notes (Dr. Eliezer Lofts with [facility]) patient was discharged by her endocrinologist due to no shows back on 10/02/17.     MD- Note patient not scheduled to get Lantus until tonight.  Received dose yesterday at 8am.  CBG this AM: 376 mg/dl.  Please consider the following:  1. Increase Lantus to 25 units daily (please give dose this AM)  2. Start Novolog Meal Coverage: Novolog 3 units TID with meals (hold if pt eats <50% of meal)    Addendum 12pm- Spoke with patient about her current A1c of 12.7%.  Reminded patient that her goal A1c is 7% or less per ADA standards to prevent both acute and long-term complications.  Explained to patient the extreme importance of good glucose control at home.  Encouraged patient to check her CBGs at least TID AC at home and to record all CBGs in a logbook for her PCP or Endocrinologist to review.  Patient  stated she is currently working with her insurance company to find a Administrator, arts (was discharged by last ENDO for no shows back on 10/02/17).  Encouraged pt to seek an appt soon with new ENDO so she can make improvements with her insulin regimen.    --Will follow patient during hospitalization--  Wyn Quaker RN, MSN, CDE Diabetes Coordinator Inpatient Glycemic Control Team Team Pager: [phone] (8a-5p)

## 2018-02-04 NOTE — Care Management Note (Signed)
Case Management Note  Patient Details  Name: SOMA BACHAND MRN: [id number] Date of Birth: [birth year]  Subjective/Objective: d/c home no CM needs.                   Action/Plan:d/c home.   Expected Discharge Date:  02/04/18               Expected Discharge Plan:  Home/Self Care  In-House Referral:     Discharge planning Services  CM Consult  Post Acute Care Choice:    Choice offered to:     DME Arranged:    DME Agency:     HH Arranged:    HH Agency:     Status of Service:  Completed, signed off  If discussed at [employer] of Stay Meetings, dates discussed:    Additional Comments:  Dessa Phi, RN 02/04/2018, 1:51 PM

## 2018-02-04 NOTE — Discharge Summary (Signed)
Physician Discharge Summary  [name] [name] WSF:[id number] DOB: 1961/01/09 DOA: 02/02/2018  PCP: Jinny Sanders, MD  Admit date: 02/02/2018 Discharge date: 02/04/2018  Time spent: 25* minutes  Recommendations for Outpatient Follow-up:  1. 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 Patient denies any chest pain or abdominal pain. She denies any fevers. She denies any diarrhea. She feels like she is dehydrated and also came to the ED for evaluation for possibly being in DKA. She says her sugars have been high also at home. Patient is being referred for admission for persistent nausea and vomiting along with DKA.    Hospital Course:  1. Chest pain-patient has history of CAD, takes Nitropaste 0.2 mg patch daily.  Was started  on  Nitropaste patch, aspirin 81 mg daily, troponin every 6 hours x3 was negative, cardiology was consulted and did not recommend any new testing at this time. 2. Diabetic ketoacidosis- resolved, patient is off IV insulin.  Continue  Lantus 20 units subcu daily, sliding scale with NovoLog 3. Hypothyroidism-stable: Continue Synthroid 4. Hypertension-blood pressure stable, continue Toprol XL, Cozaar 5. Gastroparesis- 6. GERD-continue Protonix     Procedures:  None   Consultations:  None    Discharge Exam: Vitals:   02/04/18 0700 02/04/18 0800  BP: 129/78 (!) 157/104  Pulse: (!) 102 (!) 103  Resp: (!) 22 14  Temp:  97.8 F (36.6 C)  SpO2: 98% 97%    General: Appears in no acute distress Cardiovascular: S1s2 RRR Respiratory: Clear bilaterally  Discharge Instructions   Discharge Instructions    Diet - low sodium heart healthy   Complete by:  As directed    Increase activity slowly   Complete by:  As directed      Allergies as of 02/04/2018      Reactions   Codeine Hives, Anxiety, Other (See Comments)      Medication List    TAKE these medications   aspirin 81 MG chewable tablet Chew 1 tablet (81 mg total) by mouth daily.   atorvastatin 80 MG tablet Commonly known as:  LIPITOR Take 1 tablet (80 mg total) by mouth daily at 6 PM.   BASAGLAR KWIKPEN 100 UNIT/ML Sopn Inject 0.22 mLs (22 Units total) into the skin at bedtime. What changed:  medication strength   ezetimibe 10 MG tablet Commonly known as:  ZETIA Take 1 tablet (10 mg total) by mouth daily.   insulin lispro 100 UNIT/ML KiwkPen Commonly known as:  HUMALOG KWIKPEN Inject into skin 3 times a day at meal time according to the following sliding scale: CBG 70-120 - 0 units / CBG 121-150 - 1 unit / CBG 151-200 - 2 units / CBG 201-250 - 3 units / CBG 251-300 - 5 units / CBG 301-350 - 7 units / CBG 351-400+ - 9 units  YOU MUST FOLLOW  A STRICT DIABETIC DIET   levothyroxine 150 MCG tablet Commonly known as:  SYNTHROID, LEVOTHROID Take 1 tablet (150 mcg total) by mouth daily.   losartan 25 MG tablet Commonly known as:  COZAAR Take 25 mg by mouth daily.   metoCLOPramide 5 MG tablet Commonly known as:  REGLAN TAKE 1 TABLET BY MOUTH 4 TIMES A DAY BEFORE MEALS AND AT BEDTIME   metoprolol succinate 25 MG 24 hr tablet Commonly known as:  TOPROL-XL Take 25 mg by mouth daily.   nitroGLYCERIN 0.2 mg/hr patch Commonly known as:  NITRODUR - Dosed in mg/24 hr Place 1 patch (0.2 mg total)  onto the skin daily as needed. What changed:  when to take this   ondansetron 4 MG tablet Commonly known as:  ZOFRAN TAKE 1 TABLET BY MOUTH EVERY 6 HOURS AS NEEDED FOR NAUSEA AND VOMITING   onetouch ultrasoft lancets Use to check blood sugar daily as needed.  Dx: E11.10   ONETOUCH VERIO test strip Generic drug:  glucose blood USE TO CHECK BLOOD SUGAR DAILY AS NEEDED. DX: E11.10   pantoprazole 40 MG tablet Commonly known as:  PROTONIX Take 1 tablet (40 mg total) by mouth daily at 12 noon.   Pen Needles 31G X 8 MM Misc Use to inject insulin 4 times a day.  Dx: E11.10   promethazine 25 MG suppository Commonly known as:  PHENERGAN Place 1 suppository (25 mg total) rectally every 6 (six) hours as needed for nausea or vomiting.   RELPAX 40 MG tablet Generic drug:  eletriptan ONE TABLET BY MOUTH AT ONSET OF HEADACHE. MAY REPEAT IN 2 HOURS IF HEADACHE PERSISTS OR RECURS.   sertraline 50 MG tablet Commonly known as:  ZOLOFT Take 1 tablet (50 mg total) by mouth daily.   Vitamin D (Ergocalciferol) 50000 units Caps capsule Commonly known as:  DRISDOL Take 1 capsule (50,000 Units total) by mouth every 7 (seven) days.      Allergies  Allergen Reactions  . Codeine Hives, Anxiety and Other (See Comments)      The results of significant diagnostics from this hospitalization (including imaging, microbiology, ancillary and laboratory) are listed below for reference.    Significant Diagnostic Studies: Dg Chest Port 1 View  Result Date: 02/02/2018 CLINICAL DATA:  Nausea and vomiting.  Chest discomfort for 2 days. EXAM: PORTABLE CHEST 1 VIEW COMPARISON:  None. FINDINGS: Normal heart size. Lungs clear. No pneumothorax. No pleural effusion. IMPRESSION: No active disease. Electronically Signed   By: Marybelle Killings M.D.   On: 02/02/2018 10:08    Microbiology: Recent Results (from the past 240 hour(s))  MRSA PCR Screening     Status: None   Collection Time: 02/02/18 10:00 PM  Result  Value Ref Range Status   MRSA by PCR NEGATIVE NEGATIVE Final    Comment:        The GeneXpert MRSA Assay (FDA approved for NASAL specimens only), is one component of a comprehensive MRSA colonization surveillance program. It is not intended to diagnose MRSA infection nor to guide or monitor treatment for MRSA infections. Performed at [facility], [place] [address], [place], [place] [address]      Labs: Basic Metabolic Panel: Recent Labs  Lab 02/02/18 1001  02/02/18 1546 02/02/18 1943 02/02/18 2317 02/03/18 0554 02/04/18 0836  NA  --    < > 137 134* 136 135 130*  K  --    < > 3.4* 3.4* 2.4* 3.1* 3.8  CL  --    < >  105 98* 101 98* 100*  CO2  --    < > 21* 20* 22 23 16*  GLUCOSE  --    < > 165* 282* 199* 172* 347*  BUN  --    < > 12 11 13 11  22*  CREATININE  --    < > 0.55 0.56 0.56 0.52 0.64  CALCIUM  --    < > 8.7* 9.1 9.0 9.0 8.4*  MG 1.9  --   --   --   --   --   --    < > = values in this interval not displayed.   Liver Function Tests: Recent Labs  Lab 02/02/18 0951  AST 20  ALT 16  ALKPHOS 112  BILITOT 1.2  PROT 8.4*  ALBUMIN 4.8   Recent Labs  Lab 02/02/18 0951  LIPASE 69*   No results for input(s): AMMONIA in the last 168 hours. CBC: Recent Labs  Lab 02/02/18 0752  WBC 16.2*  HGB 16.6*  HCT 46.3*  MCV 82.1  PLT 365   Cardiac Enzymes: Recent Labs  Lab 02/03/18 0959 02/03/18 1613 02/03/18 2151  TROPONINI <0.03 <0.03 <0.03   BNP:  CBG: Recent Labs  Lab 02/03/18 1139 02/03/18 1944 02/03/18 2342 02/04/18 0317 02/04/18 0832  GLUCAP 216* 161* 230* 311* 376*       Signed:  Oswald Hillock MD.  [facility] 02/04/2018, 10:42 AM

## 2018-02-05 LAB — GLUCOSE, CAPILLARY: GLUCOSE-CAPILLARY: 154 mg/dL — AB (ref 65–99)

## 2018-02-06 ENCOUNTER — Other Ambulatory Visit: Payer: Self-pay | Admitting: Family Medicine

## 2018-02-06 NOTE — Telephone Encounter (Signed)
Last office visit 01/28/2018.  Last refilled 01/21/2018 for #30 with no refills.  Ok to refill?

## 2018-02-10 ENCOUNTER — Ambulatory Visit: Payer: 59 | Admitting: Cardiology

## 2018-02-13 ENCOUNTER — Other Ambulatory Visit: Payer: Self-pay | Admitting: Family Medicine

## 2018-02-13 NOTE — Telephone Encounter (Signed)
Last office visit 01/28/2018.  Last refilled 02/06/2018 for #30 with no refills.  Ok to refill?

## 2018-02-19 ENCOUNTER — Other Ambulatory Visit: Payer: Self-pay | Admitting: Family Medicine

## 2018-02-19 NOTE — Telephone Encounter (Signed)
Last office visit 01/28/2018.  Last refilled 02/13/2018 for #30 with no refills.  Ok to refill?  Increase Quantity?

## 2018-03-06 ENCOUNTER — Emergency Department (HOSPITAL_COMMUNITY): Payer: 59

## 2018-03-06 ENCOUNTER — Encounter (HOSPITAL_COMMUNITY): Payer: Self-pay | Admitting: Emergency Medicine

## 2018-03-06 ENCOUNTER — Observation Stay (HOSPITAL_COMMUNITY)
Admission: EM | Admit: 2018-03-06 | Discharge: 2018-03-08 | DRG: 638 | Disposition: A | Discharge status: Home / Self Care | Payer: 59 | Attending: Internal Medicine | Admitting: Internal Medicine

## 2018-03-06 ENCOUNTER — Other Ambulatory Visit: Payer: Self-pay

## 2018-03-06 DIAGNOSIS — E1122 Type 2 diabetes mellitus with diabetic chronic kidney disease: Secondary | ICD-10-CM | POA: Diagnosis present

## 2018-03-06 DIAGNOSIS — I130 Hypertensive heart and chronic kidney disease with heart failure and stage 1 through stage 4 chronic kidney disease, or unspecified chronic kidney disease: Secondary | ICD-10-CM | POA: Diagnosis not present

## 2018-03-06 DIAGNOSIS — E785 Hyperlipidemia, unspecified: Secondary | ICD-10-CM | POA: Diagnosis present

## 2018-03-06 DIAGNOSIS — K529 Noninfective gastroenteritis and colitis, unspecified: Secondary | ICD-10-CM | POA: Diagnosis present

## 2018-03-06 DIAGNOSIS — K219 Gastro-esophageal reflux disease without esophagitis: Secondary | ICD-10-CM | POA: Diagnosis not present

## 2018-03-06 DIAGNOSIS — E0810 Diabetes mellitus due to underlying condition with ketoacidosis without coma: Secondary | ICD-10-CM | POA: Diagnosis not present

## 2018-03-06 DIAGNOSIS — E1110 Type 2 diabetes mellitus with ketoacidosis without coma: Principal | ICD-10-CM | POA: Diagnosis present

## 2018-03-06 DIAGNOSIS — F1290 Cannabis use, unspecified, uncomplicated: Secondary | ICD-10-CM | POA: Diagnosis not present

## 2018-03-06 DIAGNOSIS — E1143 Type 2 diabetes mellitus with diabetic autonomic (poly)neuropathy: Secondary | ICD-10-CM | POA: Diagnosis present

## 2018-03-06 DIAGNOSIS — E039 Hypothyroidism, unspecified: Secondary | ICD-10-CM | POA: Diagnosis not present

## 2018-03-06 DIAGNOSIS — E861 Hypovolemia: Secondary | ICD-10-CM | POA: Diagnosis not present

## 2018-03-06 DIAGNOSIS — I5022 Chronic systolic (congestive) heart failure: Secondary | ICD-10-CM | POA: Diagnosis present

## 2018-03-06 DIAGNOSIS — Z794 Long term (current) use of insulin: Secondary | ICD-10-CM | POA: Diagnosis not present

## 2018-03-06 DIAGNOSIS — I10 Essential (primary) hypertension: Secondary | ICD-10-CM | POA: Diagnosis not present

## 2018-03-06 DIAGNOSIS — Z885 Allergy status to narcotic agent status: Secondary | ICD-10-CM

## 2018-03-06 DIAGNOSIS — A09 Infectious gastroenteritis and colitis, unspecified: Secondary | ICD-10-CM | POA: Diagnosis present

## 2018-03-06 DIAGNOSIS — K3184 Gastroparesis: Secondary | ICD-10-CM | POA: Diagnosis not present

## 2018-03-06 DIAGNOSIS — R109 Unspecified abdominal pain: Secondary | ICD-10-CM | POA: Diagnosis present

## 2018-03-06 DIAGNOSIS — N182 Chronic kidney disease, stage 2 (mild): Secondary | ICD-10-CM | POA: Diagnosis not present

## 2018-03-06 DIAGNOSIS — F411 Generalized anxiety disorder: Secondary | ICD-10-CM | POA: Diagnosis not present

## 2018-03-06 DIAGNOSIS — R079 Chest pain, unspecified: Secondary | ICD-10-CM

## 2018-03-06 DIAGNOSIS — E1142 Type 2 diabetes mellitus with diabetic polyneuropathy: Secondary | ICD-10-CM | POA: Diagnosis not present

## 2018-03-06 DIAGNOSIS — Z7982 Long term (current) use of aspirin: Secondary | ICD-10-CM | POA: Diagnosis not present

## 2018-03-06 DIAGNOSIS — Z79890 Hormone replacement therapy (postmenopausal): Secondary | ICD-10-CM

## 2018-03-06 DIAGNOSIS — Z87891 Personal history of nicotine dependence: Secondary | ICD-10-CM

## 2018-03-06 DIAGNOSIS — E1149 Type 2 diabetes mellitus with other diabetic neurological complication: Secondary | ICD-10-CM

## 2018-03-06 DIAGNOSIS — Z8249 Family history of ischemic heart disease and other diseases of the circulatory system: Secondary | ICD-10-CM | POA: Diagnosis not present

## 2018-03-06 DIAGNOSIS — G8929 Other chronic pain: Secondary | ICD-10-CM | POA: Diagnosis present

## 2018-03-06 DIAGNOSIS — Z833 Family history of diabetes mellitus: Secondary | ICD-10-CM | POA: Diagnosis not present

## 2018-03-06 DIAGNOSIS — E1165 Type 2 diabetes mellitus with hyperglycemia: Secondary | ICD-10-CM

## 2018-03-06 LAB — COMPREHENSIVE METABOLIC PANEL
ALBUMIN: 4.4 g/dL (ref 3.5–5.0)
ALK PHOS: 112 U/L (ref 38–126)
ALT: 13 U/L — ABNORMAL LOW (ref 14–54)
ANION GAP: 20 — AB (ref 5–15)
AST: 18 U/L (ref 15–41)
BUN: 12 mg/dL (ref 6–20)
CHLORIDE: 103 mmol/L (ref 101–111)
CO2: 14 mmol/L — AB (ref 22–32)
Calcium: 9.3 mg/dL (ref 8.9–10.3)
Creatinine, Ser: 0.89 mg/dL (ref 0.44–1.00)
GFR calc non Af Amer: >60 mL/min (ref >60)
GLUCOSE: 557 mg/dL — AB (ref 65–99)
POTASSIUM: 3.7 mmol/L (ref 3.5–5.1)
SODIUM: 137 mmol/L (ref 135–145)
Total Bilirubin: 1 mg/dL (ref 0.3–1.2)
Total Protein: 7.9 g/dL (ref 6.5–8.1)

## 2018-03-06 LAB — CBC WITH DIFFERENTIAL/PLATELET
BASOS PCT: 0 %
Basophils Absolute: 0 10*3/uL (ref 0.0–0.1)
EOS ABS: 0 10*3/uL (ref 0.0–0.7)
EOS PCT: 0 %
HCT: 44.4 % (ref 36.0–46.0)
HEMOGLOBIN: 15.3 g/dL — AB (ref 12.0–15.0)
LYMPHS ABS: 1.5 10*3/uL (ref 0.7–4.0)
Lymphocytes Relative: 8 %
MCH: 29.2 pg (ref 26.0–34.0)
MCHC: 34.5 g/dL (ref 30.0–36.0)
MCV: 84.7 fL (ref 78.0–100.0)
MONOS PCT: 3 %
Monocytes Absolute: 0.5 10*3/uL (ref 0.1–1.0)
NEUTROS PCT: 89 %
Neutro Abs: 16.3 10*3/uL — ABNORMAL HIGH (ref 1.7–7.7)
PLATELETS: 383 10*3/uL (ref 150–400)
RBC: 5.24 MIL/uL — ABNORMAL HIGH (ref 3.87–5.11)
RDW: 15 % (ref 11.5–15.5)
WBC: 18.3 10*3/uL — AB (ref 4.0–10.5)

## 2018-03-06 LAB — URINALYSIS, ROUTINE W REFLEX MICROSCOPIC
BILIRUBIN URINE: NEGATIVE
Glucose, UA: >=500 mg/dL — AB
HGB URINE DIPSTICK: NEGATIVE
Ketones, ur: 80 mg/dL — AB
LEUKOCYTES UA: NEGATIVE
NITRITE: NEGATIVE
PROTEIN: 100 mg/dL — AB
Specific Gravity, Urine: 1.026 (ref 1.005–1.030)
pH: 6 (ref 5.0–8.0)

## 2018-03-06 LAB — I-STAT TROPONIN, ED: Troponin i, poc: 0 ng/mL (ref 0.00–0.08)

## 2018-03-06 LAB — I-STAT CG4 LACTIC ACID, ED: Lactic Acid, Venous: 3.64 mmol/L (ref 0.5–1.9)

## 2018-03-06 LAB — I-STAT VENOUS BLOOD GAS, ED
Acid-base deficit: 9 mmol/L — ABNORMAL HIGH (ref 0.0–2.0)
BICARBONATE: 14.4 mmol/L — AB (ref 20.0–28.0)
O2 Saturation: 88 %
PCO2 VEN: 26.2 mmHg — AB (ref 44.0–60.0)
PH VEN: 7.347 (ref 7.250–7.430)
PO2 VEN: 56 mmHg — AB (ref 32.0–45.0)
TCO2: 15 mmol/L — AB (ref 22–32)

## 2018-03-06 LAB — CBG MONITORING, ED
GLUCOSE-CAPILLARY: 542 mg/dL — AB (ref 65–99)
GLUCOSE-CAPILLARY: 442 mg/dL — AB (ref 65–99)
GLUCOSE-CAPILLARY: 345 mg/dL — AB (ref 65–99)
Glucose-Capillary: 428 mg/dL — ABNORMAL HIGH (ref 65–99)

## 2018-03-06 MED ORDER — HYDRALAZINE HCL 20 MG/ML IJ SOLN: 10.0000 mg | INTRAMUSCULAR | Status: DC | PRN

## 2018-03-06 MED ORDER — METOPROLOL SUCCINATE ER 25 MG PO TB24
25.0000 mg | ORAL_TABLET | Freq: Every day | ORAL | Status: DC
  Administered 2018-03-07 – 2018-03-08 (×2): 25 mg via ORAL
  Filled 2018-03-06 (×3): qty 1

## 2018-03-06 MED ORDER — SODIUM CHLORIDE 0.9 % IV SOLN
2.0000 g | INTRAVENOUS | Status: DC
  Administered 2018-03-06 – 2018-03-07 (×2): 2 g via INTRAVENOUS
  Filled 2018-03-06 (×3): qty 20

## 2018-03-06 MED ORDER — METOCLOPRAMIDE HCL 5 MG/ML IJ SOLN
10.0000 mg | Freq: Three times a day (TID) | INTRAMUSCULAR | Status: DC
  Administered 2018-03-06 – 2018-03-07 (×2): 10 mg via INTRAVENOUS
  Filled 2018-03-06 (×2): qty 2

## 2018-03-06 MED ORDER — PANTOPRAZOLE SODIUM 40 MG PO TBEC: 40.0000 mg | DELAYED_RELEASE_TABLET | Freq: Every day | ORAL | Status: DC

## 2018-03-06 MED ORDER — ASPIRIN 81 MG PO CHEW
81.0000 mg | CHEWABLE_TABLET | Freq: Every day | ORAL | Status: DC
  Administered 2018-03-07 – 2018-03-08 (×2): 81 mg via ORAL
  Filled 2018-03-06 (×2): qty 1

## 2018-03-06 MED ORDER — ONDANSETRON HCL 4 MG PO TABS: 4.0000 mg | ORAL_TABLET | Freq: Four times a day (QID) | ORAL | Status: DC | PRN

## 2018-03-06 MED ORDER — DEXTROSE-NACL 5-0.45 % IV SOLN
INTRAVENOUS | Status: DC
  Administered 2018-03-07: 03:00:00 via INTRAVENOUS

## 2018-03-06 MED ORDER — EZETIMIBE 10 MG PO TABS
10.0000 mg | ORAL_TABLET | Freq: Every day | ORAL | Status: DC
  Administered 2018-03-07 – 2018-03-08 (×2): 10 mg via ORAL
  Filled 2018-03-06 (×3): qty 1

## 2018-03-06 MED ORDER — METRONIDAZOLE IN NACL 5-0.79 MG/ML-% IV SOLN
500.0000 mg | Freq: Three times a day (TID) | INTRAVENOUS | Status: DC
Start: 2018-03-06 — End: 2018-03-08
  Administered 2018-03-07 – 2018-03-08 (×6): 500 mg via INTRAVENOUS
  Filled 2018-03-06 (×6): qty 100

## 2018-03-06 MED ORDER — ACETAMINOPHEN 650 MG RE SUPP: 650.0000 mg | Freq: Four times a day (QID) | RECTAL | Status: DC | PRN

## 2018-03-06 MED ORDER — SODIUM CHLORIDE 0.9 % IV SOLN
INTRAVENOUS | Status: AC
  Administered 2018-03-06: 3.8 [IU]/h via INTRAVENOUS
  Filled 2018-03-06: qty 1

## 2018-03-06 MED ORDER — SODIUM CHLORIDE 0.9 % IV SOLN
INTRAVENOUS | Status: DC
  Administered 2018-03-06: 23:00:00 via INTRAVENOUS

## 2018-03-06 MED ORDER — IOPAMIDOL (ISOVUE-300) INJECTION 61%
INTRAVENOUS | Status: AC
  Administered 2018-03-06: 100 mL
  Filled 2018-03-06: qty 100

## 2018-03-06 MED ORDER — SODIUM CHLORIDE 0.9 % IV BOLUS
1000.0000 mL | Freq: Once | INTRAVENOUS | Status: AC
  Administered 2018-03-06: 1000 mL via INTRAVENOUS

## 2018-03-06 MED ORDER — LEVOTHYROXINE SODIUM 75 MCG PO TABS
150.0000 ug | ORAL_TABLET | Freq: Every day | ORAL | Status: DC
  Administered 2018-03-07 – 2018-03-08 (×2): 150 ug via ORAL
  Filled 2018-03-06 (×3): qty 2

## 2018-03-06 MED ORDER — SODIUM CHLORIDE 0.9 % IV SOLN: INTRAVENOUS | Status: DC

## 2018-03-06 MED ORDER — LOSARTAN POTASSIUM 25 MG PO TABS
25.0000 mg | ORAL_TABLET | Freq: Every day | ORAL | Status: DC
  Administered 2018-03-07 – 2018-03-08 (×2): 25 mg via ORAL
  Filled 2018-03-06 (×3): qty 1

## 2018-03-06 MED ORDER — SERTRALINE HCL 50 MG PO TABS
50.0000 mg | ORAL_TABLET | Freq: Every day | ORAL | Status: DC
  Administered 2018-03-07 – 2018-03-08 (×2): 50 mg via ORAL
  Filled 2018-03-06 (×3): qty 1

## 2018-03-06 MED ORDER — FAMOTIDINE IN NACL 20-0.9 MG/50ML-% IV SOLN
20.0000 mg | Freq: Two times a day (BID) | INTRAVENOUS | Status: DC
  Administered 2018-03-07: 20 mg via INTRAVENOUS
  Filled 2018-03-06: qty 50

## 2018-03-06 MED ORDER — ONDANSETRON HCL 4 MG/2ML IJ SOLN
4.0000 mg | Freq: Four times a day (QID) | INTRAMUSCULAR | Status: DC | PRN
  Administered 2018-03-07: 4 mg via INTRAVENOUS
  Filled 2018-03-06: qty 2

## 2018-03-06 MED ORDER — ACETAMINOPHEN 325 MG PO TABS: 650.0000 mg | ORAL_TABLET | Freq: Four times a day (QID) | ORAL | Status: DC | PRN

## 2018-03-06 MED ORDER — SODIUM CHLORIDE 0.9% FLUSH
3.0000 mL | Freq: Two times a day (BID) | INTRAVENOUS | Status: DC
  Administered 2018-03-06 – 2018-03-08 (×3): 3 mL via INTRAVENOUS

## 2018-03-06 MED ORDER — DEXTROSE-NACL 5-0.45 % IV SOLN: INTRAVENOUS | Status: DC

## 2018-03-06 MED ORDER — ATORVASTATIN CALCIUM 80 MG PO TABS
80.0000 mg | ORAL_TABLET | Freq: Every day | ORAL | Status: DC
  Administered 2018-03-07: 80 mg via ORAL
  Filled 2018-03-06: qty 1

## 2018-03-06 MED ORDER — PROMETHAZINE HCL 25 MG/ML IJ SOLN
12.5000 mg | Freq: Once | INTRAMUSCULAR | Status: AC
  Administered 2018-03-06: 12.5 mg via INTRAVENOUS
  Filled 2018-03-06: qty 1

## 2018-03-06 MED ORDER — ENOXAPARIN SODIUM 40 MG/0.4ML ~~LOC~~ SOLN
40.0000 mg | SUBCUTANEOUS | Status: DC
  Administered 2018-03-07 – 2018-03-08 (×2): 40 mg via SUBCUTANEOUS
  Filled 2018-03-06 (×3): qty 0.4

## 2018-03-06 MED ORDER — PROMETHAZINE HCL 25 MG/ML IJ SOLN: 12.5000 mg | Freq: Four times a day (QID) | INTRAMUSCULAR | Status: DC | PRN

## 2018-03-06 MED ORDER — FENTANYL CITRATE (PF) 100 MCG/2ML IJ SOLN
50.0000 ug | Freq: Once | INTRAMUSCULAR | Status: AC
  Administered 2018-03-06: 50 ug via INTRAVENOUS
  Filled 2018-03-06: qty 2

## 2018-03-06 MED ORDER — HALOPERIDOL LACTATE 5 MG/ML IJ SOLN
2.0000 mg | Freq: Once | INTRAMUSCULAR | Status: AC
  Administered 2018-03-06: 2 mg via INTRAVENOUS
  Filled 2018-03-06: qty 1

## 2018-03-06 MED ORDER — POTASSIUM CHLORIDE 10 MEQ/100ML IV SOLN
10.0000 meq | INTRAVENOUS | Status: AC
  Administered 2018-03-07 (×2): 10 meq via INTRAVENOUS
  Filled 2018-03-06 (×2): qty 100

## 2018-03-06 NOTE — ED Provider Notes (Signed)
[facility] EMERGENCY DEPARTMENT Provider Note   CSN: [id number] Arrival date & time: 03/06/18  1319     History   Chief Complaint Chief Complaint  Patient presents with  . Chest Pain    HPI [name] [name] is a 57 y.o. female.  HPI  Patient is a 57 year old female with past medical history significant for IBS, diabetes, multiple TKAs, marijuana use, anxiety, hypertension hyperlipidemia.  She is presenting today with nausea and vomiting associated with diffuse abdominal pain.  Patient's had multiple visits for this in the past.  She continues to use marijuana.  Patient's husband at bedside states he thinks it is related.  He states he googled the symptoms again for her and showed it to her last night.  Patient takes multiple hot Showers to help with her chronic nausea vomiting.  She is here requesting pain medication through the IV as well as nausea medication.  Patient CBG is above 500.  Vital signs significant for tachycardia.  She is had no diarrhea.  Just has abdominal pain associated with vomiting.  Past Medical History:  Diagnosis Date  . Anxiety   . B12 deficiency   . Chest pain 04/05/2017  . GERD (gastroesophageal reflux disease)   . Hyperlipidemia   . Hypertension    [name]  . Hypothyroidism   . IBS (irritable bowel syndrome)   . Intermittent vertigo   . 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 Leg cramping    "@ night" (09/18/2013)  . Migraine    "once q couple months" (09/18/2013)  . Osteoporosis   . Peripheral neuropathy   . Type II diabetes mellitus ([place])   . Umbilical hernia    unrepaired (09/18/2013)    Patient Active Problem List   Diagnosis Date Noted  . Chronic systolic heart failure ([place]) 01/14/2018  . Noncompliance 01/14/2018  . Migraines 12/11/2017  . DKA (diabetic ketoacidoses) ([place]) 12/11/2017  . Chest discomfort 09/18/2017  . DKA, type 1 ([facility]) 09/17/2017  . Phrygian cap 06/20/2017  . Lower  abdominal pain   . Nonischemic cardiomyopathy ([place]) 03/22/2017  . NSTEMI (non-ST elevated myocardial infarction) ([place]) 02/11/2017  . H. pylori infection 02/09/2017  . Hypomagnesemia 02/09/2017  . Gastroparesis due to DM ([place]) 01/22/2017  . Type 2 diabetes mellitus with ketoacidosis without coma, with long-term current use of insulin ([place]) 07/17/2016  . Atrial fibrillation ([place]) 02/01/2015  . 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 Hepatic lesion 09/18/2013  . Vitamin D deficiency 05/20/2012  . Viral URI with cough 07/30/2011  . Essential hypertension 02/01/2011  . Osteoporosis 02/01/2011  . B12 deficiency 04/04/2010  . Peripheral neuropathy ([place]) 05/10/2009  . GERD 04/08/2009  . Generalized anxiety disorder 06/22/2008  . Hypothyroidism 04/29/2008  . Hyperlipidemia 04/29/2008  . Irritable bowel syndrome 04/29/2008    Past Surgical History:  Procedure Laterality Date  . CESAREAN SECTION  1989  . LAPAROSCOPIC ASSISTED VAGINAL HYSTERECTOMY  09/23/2012   Procedure: LAPAROSCOPIC ASSISTED VAGINAL HYSTERECTOMY;  Surgeon: Linda Hedges, DO;  Location: [facility];  Service: Gynecology;  Laterality: N/A;  pull Dr Gregor Hams instrument  . LEFT HEART CATH AND CORONARY ANGIOGRAPHY N/A 02/11/2017   Procedure: Left Heart Cath and Coronary Angiography;  Surgeon: Lorretta Harp, MD;  Location: [facility];  Service: Cardiovascular;  Laterality: N/A;  . TUBAL LIGATION Bilateral 1992  . VAGINAL HYSTERECTOMY  2013     OB History    Gravida  2  Para  2   Term      Preterm      AB      Living        SAB      TAB      Ectopic      Multiple      Live Births               Home Medications    Prior to Admission medications   Medication Sig Start Date End Date Taking? Authorizing Provider  aspirin 81 MG chewable tablet Chew 1 tablet (81 mg total) by mouth daily. 02/12/17   Jani Gravel, MD  atorvastatin (LIPITOR) 80 MG tablet Take 1 tablet (80 mg total)  by mouth daily at 6 PM. 09/04/16   Bedsole, Amy E, MD  ezetimibe (ZETIA) 10 MG tablet Take 1 tablet (10 mg total) by mouth daily. 09/11/16   Bedsole, Amy E, MD  Insulin Glargine (BASAGLAR KWIKPEN) 100 UNIT/ML SOPN Inject 0.22 mLs (22 Units total) into the skin at bedtime. 02/04/18   Oswald Hillock, MD  insulin lispro (HUMALOG KWIKPEN) 100 UNIT/ML KiwkPen Inject into skin 3 times a day at meal time according to the following sliding scale: CBG 70-120 - 0 units / CBG 121-150 - 1 unit / CBG 151-200 - 2 units / CBG 201-250 - 3 units / CBG 251-300 - 5 units / CBG 301-350 - 7 units / CBG 351-400+ - 9 units  YOU MUST FOLLOW A STRICT DIABETIC DIET 02/04/18   Oswald Hillock, MD  Insulin Pen Needle (PEN NEEDLES) 31G X 8 MM MISC Use to inject insulin 4 times a day.  Dx: E11.10 12/23/17   Jinny Sanders, MD  Lancets (ONETOUCH ULTRASOFT) lancets Use to check blood sugar daily as needed.  Dx: E11.10 12/23/17   Jinny Sanders, MD  levothyroxine (SYNTHROID, LEVOTHROID) 150 MCG tablet Take 1 tablet (150 mcg total) by mouth daily. 04/03/17   Philemon Kingdom, MD  losartan (COZAAR) 25 MG tablet Take 25 mg by mouth daily. 05/27/17   [provider]  metoCLOPramide (REGLAN) 5 MG tablet TAKE 1 TABLET BY MOUTH 4 TIMES A DAY BEFORE MEALS AND AT BEDTIME 02/19/18   Bedsole, Amy E, MD  metoprolol succinate (TOPROL-XL) 25 MG 24 hr tablet Take 25 mg by mouth daily. 03/24/17   [provider]  nitroGLYCERIN (NITRODUR - DOSED IN MG/24 HR) 0.2 mg/hr patch Place 1 patch (0.2 mg total) onto the skin daily as needed. Patient taking differently: Place 0.2 mg onto the skin daily.  12/14/17   Regalado, Belkys A, MD  ondansetron (ZOFRAN) 4 MG tablet TAKE 1 TABLET BY MOUTH EVERY 6 HOURS AS NEEDED FOR NAUSEA AND VOMITING 12/20/17   Bedsole, Amy E, MD  ONETOUCH VERIO test strip USE TO CHECK BLOOD SUGAR DAILY AS NEEDED. DX: E11.10 10/20/17   Bedsole, Amy E, MD  pantoprazole (PROTONIX) 40 MG tablet Take 1 tablet (40 mg total) by  mouth daily at 12 noon. 01/28/18   Bedsole, Amy E, MD  promethazine (PHENERGAN) 25 MG suppository Place 1 suppository (25 mg total) rectally every 6 (six) hours as needed for nausea or vomiting. 12/26/17   Alexismarie Flaim Lyn, MD  RELPAX 40 MG tablet ONE TABLET BY MOUTH AT ONSET OF HEADACHE. MAY REPEAT IN 2 HOURS IF HEADACHE PERSISTS OR RECURS. 10/17/16   Bedsole, Amy E, MD  sertraline (ZOLOFT) 50 MG tablet Take 1 tablet (50 mg total) by mouth daily. 12/16/17  Bedsole, Amy E, MD  Vitamin D, Ergocalciferol, (DRISDOL) 50000 units CAPS capsule Take 1 capsule (50,000 Units total) by mouth every 7 (seven) days. 01/28/18   Jinny Sanders, MD    Family History Family History  Problem Relation Age of Onset  . Diabetes Other   . Heart disease Other   . Heart failure Mother   . Diabetes Maternal Grandmother   . Alzheimer's disease Maternal Grandmother   . Coronary artery disease Maternal Grandmother   . Heart attack Maternal Grandfather   . Heart failure Brother     Social History Social History   Tobacco Use  . Smoking status: Former Smoker    Years: 0.20    Types: Cigarettes    Last attempt to quit: 12/10/1990    Years since quitting: 27.2  . Smokeless tobacco: Never Used  Substance Use Topics  . Alcohol use: No    Alcohol/week: 0.0 oz  . Drug use: Yes    Frequency: 3.0 times per week    Types: Marijuana     Allergies   Codeine   Review of Systems Review of Systems  Constitutional: Negative for activity change, fatigue and fever.  HENT: Negative for congestion.   Respiratory: Negative for shortness of breath.   Cardiovascular: Negative for chest pain.  Gastrointestinal: Positive for abdominal pain, nausea and vomiting.  Musculoskeletal: Positive for neck pain.  Neurological: Positive for headaches.  All other systems reviewed and are negative.    Physical Exam Updated Vital Signs BP (!) 155/80 (BP Location: Right Arm)   Pulse (!) 104   Temp 98 F (36.7 C) (Oral)    Resp 20   LMP 08/11/2012   SpO2 99%   Physical Exam  Constitutional: She is oriented to person, place, and time. She appears well-developed and well-nourished.  Patient vomiting loudly.  HENT:  Head: Normocephalic and atraumatic.  Eyes: Right eye exhibits no discharge.  Neck: Normal range of motion.  Neck is supple.  No signs of meningismus.  Pain in the musculature to palpation.  Cardiovascular: Regular rhythm and intact distal pulses. Tachycardia present.  Pulmonary/Chest: Effort normal and breath sounds normal. No respiratory distress.  Abdominal: There is tenderness.  Diffuse tenderness  Neurological: She is oriented to person, place, and time.  Skin: Skin is warm and dry. She is not diaphoretic.  Psychiatric: She has a normal mood and affect.  Nursing note and vitals reviewed.    ED Treatments / Results  Labs (all labs ordered are listed, but only abnormal results are displayed) Labs Reviewed  CBG MONITORING, ED - Abnormal; Notable for the following components:      Result Value   Glucose-Capillary 542 (*)    All other components within normal limits  CBC WITH DIFFERENTIAL/PLATELET  COMPREHENSIVE METABOLIC PANEL  URINALYSIS, ROUTINE W REFLEX MICROSCOPIC  I-STAT CG4 LACTIC ACID, ED  I-STAT VENOUS BLOOD GAS, ED  I-STAT TROPONIN, ED    EKG None  Radiology No results found.  Procedures Procedures (including critical care time)  CRITICAL CARE Performed by: Gardiner Sleeper Total critical care time: 60 minutes Critical care time was exclusive of separately billable procedures and treating other patients. Critical care was necessary to treat or prevent imminent or life-threatening deterioration. Critical care was time spent personally by me on the following activities: development of treatment plan with patient and/or surrogate as well as nursing, discussions with consultants, evaluation of patient's response to treatment, examination of patient, obtaining  history from patient or surrogate, ordering and  performing treatments and interventions, ordering and review of laboratory studies, ordering and review of radiographic studies, pulse oximetry and re-evaluation of patient's condition.   Medications Ordered in ED Medications  promethazine (PHENERGAN) injection 12.5 mg (has no administration in time range)  sodium chloride 0.9 % bolus 1,000 mL (has no administration in time range)  fentaNYL (SUBLIMAZE) injection 50 mcg (has no administration in time range)     Initial Impression / Assessment and Plan / ED Course  I have reviewed the triage vital signs and the nursing notes.  Pertinent labs & imaging results that were available during my care of the patient were reviewed by me and considered in my medical decision making (see chart for details).     Patient is a 57 year old female with past medical history significant for IBS, diabetes, multiple DKAs, marijuana use, anxiety, hypertension hyperlipidemia.  She is presenting today with nausea and vomiting associated with diffuse abdominal pain.  Patient's had multiple visits for this in the past.  She continues to use marijuana.  Patient's husband at bedside states he thinks it is related.  He states he googled the symptoms again for her and showed it to her last night.  Patient takes multiple hot Showers to help with her chronic nausea vomiting.  She is here requesting pain medication through the IV as well as nausea medication.  Patient CBG is above 500.  Vital signs significant for tachycardia.  She is had no diarrhea.  Just has abdominal pain associated with vomiting.  2:31 PM Will treat with IV antiemetics, fluids.  Patient had a very similar presentation when I saw him last time.  I think this likely represents hyperemesis due to cannabis use.  But given patient's abdominal tenderness, cannot rule out pathologic issue in the abdomen, will CT abdomen pelvis.  /6:43 PM Patient was little  nauseous, treid zofran and phenegran, will try to give given Haldol.  Still waiting patient CT.  Patient's anion gap just came back elevated at 20.  Will start glucose stabilizer and admit.   Final Clinical Impressions(s) / ED Diagnoses   Final diagnoses:  None    ED Discharge Orders    None       Macarthur Critchley, MD 03/07/18 Einar Crow

## 2018-03-06 NOTE — ED Triage Notes (Signed)
Pt arrives via EMS from home with hyperglycemia, nausea, vomiting, diarrhea since this AM. States has been having substernal CP radiating to back, shoulder, jaw for the last 2 days. States has on nitro patch which has been helping with the pain. Pt received 324mg  ASA, 8mg  zofran. 20g LAC. CBG 135. EKG ST. 460ml IVF PTA. Pt c/o 7/10 pain at present, aching allover. VSS.

## 2018-03-06 NOTE — H&P (Signed)
History and Physical    [name] [name] XLK:[id number] DOB: May 09, 1961 DOA: 03/06/2018  PCP: Jinny Sanders, MD   Patient coming from: Home  Chief Complaint: Abd pain, N/V/D, hyperglycemia   HPI: [name] [name] is a 57 y.o. female with medical history significant for uncontrolled insulin-dependent diabetes mellitus, diabetic gastroparesis, hypothyroidism, and chronic chest pain with normal coronaries on cardiac cath 1 year ago, now presenting to the emergency department for evaluation of abdominal pain, nausea, vomiting, diarrhea, and elevated blood sugars.  Patient reports that she had been in her usual state until yesterday morning when she developed diffuse abdominal tenderness with nausea, nonbloody vomiting, and nonbloody diarrhea.  She reports his symptoms have progressed since that time.  She denies fevers or chills, denies recent antibiotic use, denies shortness of breath, denies cough, and denies dysuria or flank pain.  She reports chest pain that is chronic and unchanged.    ED Course: Upon arrival to the ED, patient is found to be afebrile, saturating well on room air, tachycardic to 110, and hypertensive to 170/100.  EKG features a sinus tachycardia with rate 103.  Chemistry panel is notable for a glucose of 557, bicarbonate 14, and anion gap of 20.  CBC features a leukocytosis to 18,300.  Lactic acid is elevated to 3.64, troponin is undetectable, and urinalysis features glucosuria and ketonuria.  CT of the abdomen and pelvis reveals diffuse wall thickening of the colon with prominent mucosal enhancement concerning for colitis.  Patient was given 2 L of normal saline, Phenergan, Haldol, fentanyl, and started on insulin infusion in the ED.  She remains hemodynamically stable, and no apparent respiratory distress, and will be admitted to the stepdown unit for ongoing evaluation and management of diabetic ketoacidosis.  Review of Systems:  All other systems reviewed and apart from HPI, are  negative.  Past Medical History:  Diagnosis Date  . Anxiety   . B12 deficiency   . Chest pain 04/05/2017  . GERD (gastroesophageal reflux disease)   . Hyperlipidemia   . Hypertension    [name]  . Hypothyroidism   . IBS (irritable bowel syndrome)   . Intermittent vertigo   . 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 Leg cramping    "@ night" (09/18/2013)  . Migraine    "once q couple months" (09/18/2013)  . Osteoporosis   . Peripheral neuropathy   . Type II diabetes mellitus ([place])   . Umbilical hernia    unrepaired (09/18/2013)    Past Surgical History:  Procedure Laterality Date  . CESAREAN SECTION  1989  . LAPAROSCOPIC ASSISTED VAGINAL HYSTERECTOMY  09/23/2012   Procedure: LAPAROSCOPIC ASSISTED VAGINAL HYSTERECTOMY;  Surgeon: Linda Hedges, DO;  Location: [facility];  Service: Gynecology;  Laterality: N/A;  pull Dr Gregor Hams instrument  . LEFT HEART CATH AND CORONARY ANGIOGRAPHY N/A 02/11/2017   Procedure: Left Heart Cath and Coronary Angiography;  Surgeon: Lorretta Harp, MD;  Location: [facility];  Service: Cardiovascular;  Laterality: N/A;  . TUBAL LIGATION Bilateral 1992  . VAGINAL HYSTERECTOMY  2013     reports that she quit smoking about 27 years ago. Her smoking use included cigarettes. She quit after 0.20 years of use. She has never used smokeless tobacco. She reports that she has current or past drug history. Drug: Marijuana. Frequency: 3.00 times per week. She reports that she does not drink alcohol.  Allergies  Allergen Reactions  . Codeine Hives, Anxiety and Other (See Comments)  Family History  Problem Relation Age of Onset  . Diabetes Other   . Heart disease Other   . Heart failure Mother   . Diabetes Maternal Grandmother   . Alzheimer's disease Maternal Grandmother   . Coronary artery disease Maternal Grandmother   . Heart attack Maternal Grandfather   . Heart failure Brother      Prior to Admission medications     Medication Sig Start Date End Date Taking? Authorizing Provider  aspirin 81 MG chewable tablet Chew 1 tablet (81 mg total) by mouth daily. 02/12/17  Yes Jani Gravel, MD  atorvastatin (LIPITOR) 80 MG tablet Take 1 tablet (80 mg total) by mouth daily at 6 PM. 09/04/16  Yes Bedsole, Amy E, MD  ezetimibe (ZETIA) 10 MG tablet Take 1 tablet (10 mg total) by mouth daily. 09/11/16  Yes Bedsole, Amy E, MD  Insulin Glargine (BASAGLAR KWIKPEN) 100 UNIT/ML SOPN Inject 0.22 mLs (22 Units total) into the skin at bedtime. 02/04/18  Yes Darrick Meigs, Marge Duncans, MD  insulin lispro (HUMALOG KWIKPEN) 100 UNIT/ML KiwkPen Inject into skin 3 times a day at meal time according to the following sliding scale: CBG 70-120 - 0 units / CBG 121-150 - 1 unit / CBG 151-200 - 2 units / CBG 201-250 - 3 units / CBG 251-300 - 5 units / CBG 301-350 - 7 units / CBG 351-400+ - 9 units  YOU MUST FOLLOW A STRICT DIABETIC DIET 02/04/18  Yes Darrick Meigs, Marge Duncans, MD  levothyroxine (SYNTHROID, LEVOTHROID) 150 MCG tablet Take 1 tablet (150 mcg total) by mouth daily. 04/03/17  Yes Philemon Kingdom, MD  losartan (COZAAR) 25 MG tablet Take 25 mg by mouth daily. 05/27/17  Yes [provider]  metoCLOPramide (REGLAN) 5 MG tablet TAKE 1 TABLET BY MOUTH 4 TIMES A DAY BEFORE MEALS AND AT BEDTIME 02/19/18  Yes Bedsole, Amy E, MD  metoprolol succinate (TOPROL-XL) 25 MG 24 hr tablet Take 25 mg by mouth daily. 03/24/17  Yes [provider]  nitroGLYCERIN (NITRODUR - DOSED IN MG/24 HR) 0.2 mg/hr patch Place 1 patch (0.2 mg total) onto the skin daily as needed. Patient taking differently: Place 0.2 mg onto the skin daily.  12/14/17  Yes Regalado, Belkys A, MD  ondansetron (ZOFRAN) 4 MG tablet TAKE 1 TABLET BY MOUTH EVERY 6 HOURS AS NEEDED FOR NAUSEA AND VOMITING 12/20/17  Yes Bedsole, Amy E, MD  pantoprazole (PROTONIX) 40 MG tablet Take 1 tablet (40 mg total) by mouth daily at 12 noon. 01/28/18  Yes Bedsole, Amy E, MD  promethazine (PHENERGAN) 25 MG suppository  Place 1 suppository (25 mg total) rectally every 6 (six) hours as needed for nausea or vomiting. 12/26/17  Yes Mackuen, Courteney Lyn, MD  RELPAX 40 MG tablet ONE TABLET BY MOUTH AT ONSET OF HEADACHE. MAY REPEAT IN 2 HOURS IF HEADACHE PERSISTS OR RECURS. 10/17/16  Yes Bedsole, Amy E, MD  sertraline (ZOLOFT) 50 MG tablet Take 1 tablet (50 mg total) by mouth daily. 12/16/17  Yes Bedsole, Amy E, MD  Vitamin D, Ergocalciferol, (DRISDOL) 50000 units CAPS capsule Take 1 capsule (50,000 Units total) by mouth every 7 (seven) days. 01/28/18  Yes Bedsole, Amy E, MD  Insulin Pen Needle (PEN NEEDLES) 31G X 8 MM MISC Use to inject insulin 4 times a day.  Dx: E11.10 12/23/17   Jinny Sanders, MD  Lancets (ONETOUCH ULTRASOFT) lancets Use to check blood sugar daily as needed.  Dx: E11.10 12/23/17   Jinny Sanders, MD  [facility]  VERIO test strip USE TO CHECK BLOOD SUGAR DAILY AS NEEDED. DX: E11.10 10/20/17   Jinny Sanders, MD    Physical Exam: Vitals:   03/06/18 1331 03/06/18 1453 03/06/18 1656 03/06/18 2035  BP: (!) 155/80  (!) 172/104 (!) 159/89  Pulse: (!) 104  (!) 110 (!) 121  Resp: 20  18 16   Temp: 98 F (36.7 C)     TempSrc: Oral     SpO2: 99%  99% 98%  Weight:  61.2 kg (135 lb)    Height:  5\' 1"  (1.549 m)        Constitutional: NAD, calm, appears uncomfortable and older than stated age  Eyes: PERTLA, lids and conjunctivae normal ENMT: Mucous membranes are moist. Posterior pharynx clear of any exudate or lesions.   Neck: normal, supple, no masses, no thyromegaly Respiratory: clear to auscultation bilaterally, no wheezing, no crackles. Normal respiratory effort.    Cardiovascular: S1 & S2 heard, regular rate and rhythm. No significant JVD. Abdomen: No distension, soft, diffuse mild tenderness. Bowel sounds active.  Musculoskeletal: no clubbing / cyanosis. No joint deformity upper and lower extremities.   Skin: no significant rashes, lesions, ulcers. Warm, dry, well-perfused. Neurologic: CN 2-12  grossly intact. Sensation intact. Strength 5/5 in all 4 limbs.  Psychiatric: Alert and oriented x 3. Calm, cooperative.     Labs on Admission: I have personally reviewed following labs and imaging studies  CBC: Recent Labs  Lab 03/06/18 1451  WBC 18.3*  NEUTROABS 16.3*  HGB 15.3*  HCT 44.4  MCV 84.7  PLT 563   Basic Metabolic Panel: Recent Labs  Lab 03/06/18 1451  NA 137  K 3.7  CL 103  CO2 14*  GLUCOSE 557*  BUN 12  CREATININE 0.89  CALCIUM 9.3   GFR: Estimated Creatinine Clearance: 60.7 mL/min (by C-G formula based on SCr of 0.89 mg/dL). Liver Function Tests: Recent Labs  Lab 03/06/18 1451  AST 18  ALT 13*  ALKPHOS 112  BILITOT 1.0  PROT 7.9  ALBUMIN 4.4   No results for input(s): LIPASE, AMYLASE in the last 168 hours. No results for input(s): AMMONIA in the last 168 hours. Coagulation Profile: No results for input(s): INR, PROTIME in the last 168 hours. Cardiac Enzymes: No results for input(s): CKTOTAL, CKMB, CKMBINDEX, TROPONINI in the last 168 hours. BNP (last 3 results) No results for input(s): PROBNP in the last 8760 hours. HbA1C: No results for input(s): HGBA1C in the last 72 hours. CBG: Recent Labs  Lab 03/06/18 1327 03/06/18 1946  GLUCAP 542* 442*   Lipid Profile: No results for input(s): CHOL, HDL, LDLCALC, TRIG, CHOLHDL, LDLDIRECT in the last 72 hours. Thyroid Function Tests: No results for input(s): TSH, T4TOTAL, FREET4, T3FREE, THYROIDAB in the last 72 hours. Anemia Panel: No results for input(s): VITAMINB12, FOLATE, FERRITIN, TIBC, IRON, RETICCTPCT in the last 72 hours. Urine analysis:    Component Value Date/Time   COLORURINE STRAW (A) 03/06/2018 1424   APPEARANCEUR CLEAR 03/06/2018 1424   LABSPEC 1.026 03/06/2018 1424   PHURINE 6.0 03/06/2018 1424   GLUCOSEU >=500 (A) 03/06/2018 1424   HGBUR NEGATIVE 03/06/2018 1424   BILIRUBINUR NEGATIVE 03/06/2018 1424   BILIRUBINUR negative 09/11/2016 1038   KETONESUR 80 (A) 03/06/2018  1424   PROTEINUR 100 (A) 03/06/2018 1424   UROBILINOGEN 0.2 09/11/2016 1038   UROBILINOGEN 0.2 10/28/2013 1723   NITRITE NEGATIVE 03/06/2018 1424   LEUKOCYTESUR NEGATIVE 03/06/2018 1424   Sepsis Labs: @LABRCNTIP (procalcitonin:4,lacticidven:4) )No results found for this or any previous  visit (from the past 240 hour(s)).   Radiological Exams on Admission: Ct Abdomen Pelvis W Contrast  Result Date: 03/06/2018 CLINICAL DATA:  Abdominal pain with nausea and vomiting EXAM: CT ABDOMEN AND PELVIS WITH CONTRAST TECHNIQUE: Multidetector CT imaging of the abdomen and pelvis was performed using the standard protocol following bolus administration of intravenous contrast. CONTRAST:  170mL ISOVUE-300 IOPAMIDOL (ISOVUE-300) INJECTION 61% COMPARISON:  12/26/2017, 09/19/2017, 06/03/2017 FINDINGS: Lower chest: No acute abnormality. Hepatobiliary: No focal liver abnormality is seen. No gallstones, gallbladder wall thickening, or biliary dilatation. Pancreas: Unremarkable. No pancreatic ductal dilatation or surrounding inflammatory changes. Spleen: Normal in size without focal abnormality. Adrenals/Urinary Tract: Adrenal glands are unremarkable. Kidneys are normal, without renal calculi, focal lesion, or hydronephrosis. Bladder is unremarkable. Stomach/Bowel: Stomach slightly dilated. No dilated small bowel. Diffuse colon wall thickening with mucosal enhancement. Normal appendix. Vascular/Lymphatic: No significant vascular findings are present. No enlarged abdominal or pelvic lymph nodes. Reproductive: Status post hysterectomy. No adnexal masses. Other: No ascites or free air.  Fat containing umbilical hernia. Musculoskeletal: No acute or significant osseous findings. IMPRESSION: 1. Diffuse wall thickening of the colon with prominent mucosal enhancement suspect for diffuse colitis of infectious or inflammatory etiology. If history of recent antibiotic usage, could consider C difficile. 2. Otherwise no acute abnormality is  seen. Electronically Signed   By: Donavan Foil M.D.   On: 03/06/2018 19:47    EKG: Independently reviewed. Sinus tachycardia (rate 103).   Assessment/Plan   1. DKA; insulin-dependent DM; diabetic gastroparesis   - Patient has IDDM with frequent DKA and A1c of 12.9% last month  - She presents with abdominal pain, N/V/D, and is found to be in DKA again  - Treated in ED with 2 liters NS and started on insulin infusion  - Continue IVF hydration, continue insulin infusion with frequent CBG's and serial chem panels  - Continue scheduled Reglan, continue antiemetics  2. Chronic chest pain  - Patient uses nitroglycerin patch for chronic chest pain  - She had cath in March 2018 with normal coronaries  - No ischemic features on EKG and initial troponin is undetectable  - Continue daily aspirin and ARB   3. Colitis  - Presents with diffuse abdominal pain, N/V/D  - She is afebrile with leukocytosis and elevated lactate  - CT abd/pelvis with diffuse wall-thickening and mucosal enhancement of colon, suggestive of colitis  - No recent abx use  - Check stool studies, maintain enteric precautions, hydrate with IVF, continue prn antiemetics    4. Hypertension  - BP elevated in ED  - Continue losartan, use hydralazine IVP's prn   5. Chronic systolic CHF  - Appears hypovolemic on admission in setting of DKA with N/V/D  - Treated in ED with 2 liters and continued on IVF hydration  - Follow daily wt and I/O's, continue ARB    6. Hypothyroidism  - Continue Synthroid     DVT prophylaxis: Lovenox  Code Status: Full  Family Communication: Discussed with patient Consults called: None Admission status: Inpatient   [name], MD [facility] Pager [phone]  If 7PM-7AM, please contact night-coverage [email or web address] Password [facility]  03/06/2018, 9:08 PM

## 2018-03-06 NOTE — ED Notes (Signed)
Insulin rate dose change verified with Aaron Edelman, RN.

## 2018-03-07 ENCOUNTER — Other Ambulatory Visit: Payer: Self-pay

## 2018-03-07 DIAGNOSIS — K529 Noninfective gastroenteritis and colitis, unspecified: Secondary | ICD-10-CM | POA: Diagnosis present

## 2018-03-07 DIAGNOSIS — E1143 Type 2 diabetes mellitus with diabetic autonomic (poly)neuropathy: Secondary | ICD-10-CM | POA: Diagnosis not present

## 2018-03-07 LAB — C DIFFICILE QUICK SCREEN W PCR REFLEX
C DIFFICLE (CDIFF) ANTIGEN: NEGATIVE
C Diff interpretation: NOT DETECTED
C Diff toxin: NEGATIVE

## 2018-03-07 LAB — BASIC METABOLIC PANEL
ANION GAP: 15 (ref 5–15)
ANION GAP: 9 (ref 5–15)
BUN: 9 mg/dL (ref 6–20)
BUN: 9 mg/dL (ref 6–20)
CHLORIDE: 109 mmol/L (ref 101–111)
CHLORIDE: 111 mmol/L (ref 101–111)
CO2: 15 mmol/L — AB (ref 22–32)
CO2: 17 mmol/L — AB (ref 22–32)
Calcium: 9.2 mg/dL (ref 8.9–10.3)
Calcium: 8.9 mg/dL (ref 8.9–10.3)
Creatinine, Ser: 0.71 mg/dL (ref 0.44–1.00)
Creatinine, Ser: 0.53 mg/dL (ref 0.44–1.00)
GFR calc Af Amer: >60 mL/min (ref >60)
GFR calc Af Amer: >60 mL/min (ref >60)
GFR calc non Af Amer: >60 mL/min (ref >60)
GLUCOSE: 174 mg/dL — AB (ref 65–99)
GLUCOSE: 323 mg/dL — AB (ref 65–99)
POTASSIUM: 3.3 mmol/L — AB (ref 3.5–5.1)
POTASSIUM: 3.3 mmol/L — AB (ref 3.5–5.1)
Sodium: 139 mmol/L (ref 135–145)
Sodium: 137 mmol/L (ref 135–145)

## 2018-03-07 LAB — CBC WITH DIFFERENTIAL/PLATELET
Basophils Absolute: 0 10*3/uL (ref 0.0–0.1)
Basophils Relative: 0 %
EOS ABS: 0 10*3/uL (ref 0.0–0.7)
Eosinophils Relative: 0 %
HEMATOCRIT: 41.4 % (ref 36.0–46.0)
HEMOGLOBIN: 13.9 g/dL (ref 12.0–15.0)
LYMPHS ABS: 2.3 10*3/uL (ref 0.7–4.0)
LYMPHS PCT: 15 %
MCH: 28.3 pg (ref 26.0–34.0)
MCHC: 33.6 g/dL (ref 30.0–36.0)
MCV: 84.3 fL (ref 78.0–100.0)
MONOS PCT: 11 %
Monocytes Absolute: 1.8 10*3/uL — ABNORMAL HIGH (ref 0.1–1.0)
NEUTROS ABS: 11.8 10*3/uL — AB (ref 1.7–7.7)
NEUTROS PCT: 74 %
Platelets: 350 10*3/uL (ref 150–400)
RBC: 4.91 MIL/uL (ref 3.87–5.11)
RDW: 15.3 % (ref 11.5–15.5)
WBC: 15.9 10*3/uL — AB (ref 4.0–10.5)

## 2018-03-07 LAB — GLUCOSE, CAPILLARY
GLUCOSE-CAPILLARY: 124 mg/dL — AB (ref 65–99)
GLUCOSE-CAPILLARY: 137 mg/dL — AB (ref 65–99)
GLUCOSE-CAPILLARY: 166 mg/dL — AB (ref 65–99)
GLUCOSE-CAPILLARY: 98 mg/dL (ref 65–99)
Glucose-Capillary: 154 mg/dL — ABNORMAL HIGH (ref 65–99)
Glucose-Capillary: 112 mg/dL — ABNORMAL HIGH (ref 65–99)
Glucose-Capillary: 128 mg/dL — ABNORMAL HIGH (ref 65–99)
Glucose-Capillary: 121 mg/dL — ABNORMAL HIGH (ref 65–99)

## 2018-03-07 LAB — LACTIC ACID, PLASMA
LACTIC ACID, VENOUS: 1.2 mmol/L (ref 0.5–1.9)
LACTIC ACID, VENOUS: 1.7 mmol/L (ref 0.5–1.9)

## 2018-03-07 LAB — BASIC METABOLIC PANEL WITH GFR
Anion gap: 9 (ref 5–15)
BUN: 11 mg/dL (ref 6–20)
CO2: 18 mmol/L — ABNORMAL LOW (ref 22–32)
Calcium: 8.6 mg/dL — ABNORMAL LOW (ref 8.9–10.3)
Chloride: 107 mmol/L (ref 101–111)
Creatinine, Ser: 0.58 mg/dL (ref 0.44–1.00)
GFR calc Af Amer: >60 mL/min
GFR calc non Af Amer: >60 mL/min
Glucose, Bld: 183 mg/dL — ABNORMAL HIGH (ref 65–99)
Potassium: 3 mmol/L — ABNORMAL LOW (ref 3.5–5.1)
Sodium: 134 mmol/L — ABNORMAL LOW (ref 135–145)

## 2018-03-07 LAB — CBG MONITORING, ED
GLUCOSE-CAPILLARY: 179 mg/dL — AB (ref 65–99)
GLUCOSE-CAPILLARY: 266 mg/dL — AB (ref 65–99)
GLUCOSE-CAPILLARY: 306 mg/dL — AB (ref 65–99)
GLUCOSE-CAPILLARY: 336 mg/dL — AB (ref 65–99)
Glucose-Capillary: 198 mg/dL — ABNORMAL HIGH (ref 65–99)
Glucose-Capillary: 176 mg/dL — ABNORMAL HIGH (ref 65–99)
Glucose-Capillary: 90 mg/dL (ref 65–99)
Glucose-Capillary: 149 mg/dL — ABNORMAL HIGH (ref 65–99)
Glucose-Capillary: 177 mg/dL — ABNORMAL HIGH (ref 65–99)
Glucose-Capillary: 129 mg/dL — ABNORMAL HIGH (ref 65–99)

## 2018-03-07 LAB — TROPONIN I: Troponin I: <0.03 ng/mL (ref <0.03)

## 2018-03-07 LAB — HIV ANTIBODY (ROUTINE TESTING W REFLEX): HIV Screen 4th Generation wRfx: NONREACTIVE

## 2018-03-07 MED ORDER — METOCLOPRAMIDE HCL 5 MG/ML IJ SOLN
5.0000 mg | Freq: Three times a day (TID) | INTRAMUSCULAR | Status: DC
  Administered 2018-03-07 – 2018-03-08 (×3): 5 mg via INTRAVENOUS
  Filled 2018-03-07 (×3): qty 2

## 2018-03-07 MED ORDER — INSULIN ASPART 100 UNIT/ML ~~LOC~~ SOLN
0.0000 [IU] | SUBCUTANEOUS | Status: DC
  Administered 2018-03-07: 1 [IU] via SUBCUTANEOUS
  Administered 2018-03-07 – 2018-03-08 (×2): 2 [IU] via SUBCUTANEOUS

## 2018-03-07 MED ORDER — SODIUM CHLORIDE 0.9 % IV SOLN
INTRAVENOUS | Status: DC
  Administered 2018-03-07 – 2018-03-08 (×2): via INTRAVENOUS

## 2018-03-07 MED ORDER — POTASSIUM CHLORIDE CRYS ER 20 MEQ PO TBCR
40.0000 meq | EXTENDED_RELEASE_TABLET | Freq: Once | ORAL | Status: AC
  Administered 2018-03-07: 40 meq via ORAL
  Filled 2018-03-07: qty 2

## 2018-03-07 MED ORDER — FAMOTIDINE 20 MG PO TABS
20.0000 mg | ORAL_TABLET | Freq: Every day | ORAL | Status: DC
  Administered 2018-03-07 – 2018-03-08 (×2): 20 mg via ORAL
  Filled 2018-03-07 (×2): qty 1

## 2018-03-07 MED ORDER — INSULIN GLARGINE 100 UNIT/ML ~~LOC~~ SOLN
15.0000 [IU] | Freq: Every day | SUBCUTANEOUS | Status: DC
  Administered 2018-03-07: 15 [IU] via SUBCUTANEOUS
  Filled 2018-03-07 (×2): qty 0.15

## 2018-03-07 NOTE — ED Notes (Signed)
Breakfast tray ordered 

## 2018-03-07 NOTE — ED Notes (Signed)
Verified rate/dose change with Annamary Carolin, RN.

## 2018-03-07 NOTE — Progress Notes (Signed)
[facility] Progress Note  Patient: [name] [name] [id number]   PCP: Jinny Sanders, MD DOB: [birth year]   DOA: 03/06/2018   DOS: 03/07/2018   Date of Service: the patient was seen and examined on 03/07/2018  Subjective: Feeling better, pain better.  No nausea no vomiting.  No diarrhea reported since this morning.  No abdominal pain as well.  Brief hospital course: Pt. with PMH of IDDM, gastroparesis, hypothyroidism, chronic chest pain; admitted on 03/06/2018, presented with complaint of abdominal pain nausea and vomiting, was found to have DKA along with infectious colitis. Currently further plan is continue treatment.  Assessment and Plan: 1.  DKA. Uncontrolled diabetes. Hemoglobin A1c 12.9 last night. Diabetic gastroparesis. Anion gap resolved for now. Started on IV fluids as well as IV insulin drip initially. Now transition to IV fluids and sliding scale insulin. We will dose her with lower dose of long-acting Lantus given that she will remain on clear liquid diet only for now. Continue Reglan.  Continue as needed antiemetic. Daily BMP.  2.  Abdominal pain. Colitis. Patient had abdominal pain CT abdomen and pelvis are performed shows diffuse wall thickening. C. difficile by PCR negative. Patient started on IV ceftriaxone and Flagyl.  Continue clear liquid diet for now.  Monitor.  3.  Chronic chest pain. Normal coronaries in 2018. No further work  Aspirin and ARB continue.  4.  Essential hypertension. Chronic systolic CHF Blood pressure elevated in the ED, continue home regimen. Hypovolemic on admission, currently euvolemic.  Gentle hydration continued.  5.  Hypothyroidism. Continue Synthroid.  Diet: clear liquid diet DVT Prophylaxis: subcutaneous Heparin  Advance goals of care discussion: full code  Family Communication: no family was present at bedside, at the time of interview.   Disposition:  Discharge to home.  Consultants: none Procedures:  none  Antibiotics: Anti-infectives (From admission, onward)   Start     Dose/Rate Route Frequency Ordered Stop   03/06/18 2230  cefTRIAXone (ROCEPHIN) 2 g in sodium chloride 0.9 % 100 mL IVPB     2 g 200 mL/hr over 30 Minutes Intravenous Every 24 hours 03/06/18 2107     03/06/18 2230  metroNIDAZOLE (FLAGYL) IVPB 500 mg     500 mg 100 mL/hr over 60 Minutes Intravenous Every 8 hours 03/06/18 2107         Objective: Physical Exam: Vitals:   03/07/18 0700 03/07/18 1027 03/07/18 1134 03/07/18 1557  BP: 101/68 122/81 121/72 107/71  Pulse: (!) 102 96 94 89  Resp:  16  20  Temp:   98.4 F (36.9 C) 98.5 F (36.9 C)  TempSrc:   Oral Oral  SpO2: 97% 99% 100% 99%  Weight:      Height:        Intake/Output Summary (Last 24 hours) at 03/07/2018 1911 Last data filed at 03/07/2018 1509 Gross per 24 hour  Intake 2349.3 ml  Output 900 ml  Net 1449.3 ml   Filed Weights   03/06/18 1453  Weight: 61.2 kg (135 lb)   General: Alert, Awake and Oriented to Time, Place and Person. Appear in mild distress, affect appropriate Eyes: PERRL, Conjunctiva normal ENT: Oral Mucosa clear moist. Neck: no JVD, no Abnormal Mass Or lumps Cardiovascular: S1 and S2 Present, no Murmur, Peripheral Pulses Present Respiratory: normal respiratory effort, Bilateral Air entry equal and Decreased, no use of accessory muscle, Clear to Auscultation, no Crackles, no wheezes Abdomen: Bowel Sound present, Soft and no tenderness, no hernia Skin: no redness, no Rash, no  induration Extremities: no Pedal edema, no calf tenderness Neurologic: Grossly no focal neuro deficit. Bilaterally Equal motor strength  Data Reviewed: CBC: Recent Labs  Lab 03/06/18 1451 03/07/18 0409  WBC 18.3* 15.9*  NEUTROABS 16.3* 11.8*  HGB 15.3* 13.9  HCT 44.4 41.4  MCV 84.7 84.3  PLT 383 735   Basic Metabolic Panel: Recent Labs  Lab 03/06/18 1451 03/07/18 0005 03/07/18 0409 03/07/18 0917  NA 137 139 137 134*  K 3.7 3.3* 3.3* 3.0*   CL 103 109 111 107  CO2 14* 15* 17* 18*  GLUCOSE 557* 323* 174* 183*  BUN 12 9 9 11   CREATININE 0.89 0.71 0.53 0.58  CALCIUM 9.3 9.2 8.9 8.6*    Liver Function Tests: Recent Labs  Lab 03/06/18 1451  AST 18  ALT 13*  ALKPHOS 112  BILITOT 1.0  PROT 7.9  ALBUMIN 4.4   No results for input(s): LIPASE, AMYLASE in the last 168 hours. No results for input(s): AMMONIA in the last 168 hours. Coagulation Profile: No results for input(s): INR, PROTIME in the last 168 hours. Cardiac Enzymes: Recent Labs  Lab 03/07/18 0005 03/07/18 0409  TROPONINI <0.03 <0.03   BNP (last 3 results) No results for input(s): PROBNP in the last 8760 hours. CBG: Recent Labs  Lab 03/07/18 1255 03/07/18 1432 03/07/18 1509 03/07/18 1553 03/07/18 1723  GLUCAP 112* 128* 124* 137* 121*   Studies: Ct Abdomen Pelvis W Contrast  Result Date: 03/06/2018 CLINICAL DATA:  Abdominal pain with nausea and vomiting EXAM: CT ABDOMEN AND PELVIS WITH CONTRAST TECHNIQUE: Multidetector CT imaging of the abdomen and pelvis was performed using the standard protocol following bolus administration of intravenous contrast. CONTRAST:  176mL ISOVUE-300 IOPAMIDOL (ISOVUE-300) INJECTION 61% COMPARISON:  12/26/2017, 09/19/2017, 06/03/2017 FINDINGS: Lower chest: No acute abnormality. Hepatobiliary: No focal liver abnormality is seen. No gallstones, gallbladder wall thickening, or biliary dilatation. Pancreas: Unremarkable. No pancreatic ductal dilatation or surrounding inflammatory changes. Spleen: Normal in size without focal abnormality. Adrenals/Urinary Tract: Adrenal glands are unremarkable. Kidneys are normal, without renal calculi, focal lesion, or hydronephrosis. Bladder is unremarkable. Stomach/Bowel: Stomach slightly dilated. No dilated small bowel. Diffuse colon wall thickening with mucosal enhancement. Normal appendix. Vascular/Lymphatic: No significant vascular findings are present. No enlarged abdominal or pelvic lymph  nodes. Reproductive: Status post hysterectomy. No adnexal masses. Other: No ascites or free air.  Fat containing umbilical hernia. Musculoskeletal: No acute or significant osseous findings. IMPRESSION: 1. Diffuse wall thickening of the colon with prominent mucosal enhancement suspect for diffuse colitis of infectious or inflammatory etiology. If history of recent antibiotic usage, could consider C difficile. 2. Otherwise no acute abnormality is seen. Electronically Signed   By: Donavan Foil M.D.   On: 03/06/2018 19:47   Dg Chest Port 1 View  Result Date: 03/06/2018 CLINICAL DATA:  Chest pain EXAM: PORTABLE CHEST 1 VIEW COMPARISON:  Chest radiograph 02/02/2018 FINDINGS: The heart size and mediastinal contours are within normal limits. Both lungs are clear. The visualized skeletal structures are unremarkable. IMPRESSION: No active disease. Electronically Signed   By: Ulyses Jarred M.D.   On: 03/06/2018 21:19    Scheduled Meds: . aspirin  81 mg Oral Daily  . atorvastatin  80 mg Oral q1800  . enoxaparin (LOVENOX) injection  40 mg Subcutaneous Q24H  . ezetimibe  10 mg Oral Daily  . famotidine  20 mg Oral Daily  . insulin aspart  0-9 Units Subcutaneous Q4H  . insulin glargine  15 Units Subcutaneous Daily  . levothyroxine  150  mcg Oral QAC breakfast  . losartan  25 mg Oral Daily  . metoCLOPramide (REGLAN) injection  5 mg Intravenous Q8H  . metoprolol succinate  25 mg Oral Daily  . sertraline  50 mg Oral Daily  . sodium chloride flush  3 mL Intravenous Q12H   Continuous Infusions: . sodium chloride 75 mL/hr at 03/07/18 1244  . cefTRIAXone (ROCEPHIN)  IV Stopped (03/07/18 0032)  . metronidazole Stopped (03/07/18 1435)   PRN Meds: acetaminophen **OR** acetaminophen, hydrALAZINE, ondansetron **OR** ondansetron (ZOFRAN) IV, promethazine  Time spent: 35 minutes  Author: Berle Mull, MD [facility] Pager: [phone] 03/07/2018 7:11 PM  If 7PM-7AM, please contact night-coverage at  [email or web address], password [facility]

## 2018-03-07 NOTE — Progress Notes (Signed)
Inpatient Diabetes Program Recommendations  AACE/ADA: New Consensus Statement on Inpatient Glycemic Control (2015)  Target Ranges:  Prepandial:   less than 140 mg/dL      Peak postprandial:   less than 180 mg/dL (1-2 hours)      Critically ill patients:  140 - 180 mg/dL   Lab Results  Component Value Date   GLUCAP 129 (H) 03/07/2018   HGBA1C 12.7 (H) 02/02/2018   Spoke with patient about diabetes and home regimen for diabetes control. Patient was followed by C. Gherghe for DM, however due to no show appointments patient reports she was dismissed from the practice. Patient last saw Endocrinology April 2018. Patient is currently being managed by Dr. Lucilla Lame and is currently taking Humalog SSI and Basaglar. Last A1c was 12.7% in February 2019. Discussed February's A1c with patient and discussed glucose and A1c goals.  Patient reports checking her glucose 3-4 times a day and dosing Humalog based on her glucose levels with a sliding scale for her meals.   Patient verbalized understanding of information discussed and has no further questions at this time related to diabetes.   Thanks,  Tama Headings RN, MSN, [facility] Inpatient Diabetes Coordinator Team Pager [phone] (8a-5p)

## 2018-03-08 DIAGNOSIS — E1143 Type 2 diabetes mellitus with diabetic autonomic (poly)neuropathy: Secondary | ICD-10-CM | POA: Diagnosis not present

## 2018-03-08 LAB — GLUCOSE, CAPILLARY
GLUCOSE-CAPILLARY: 157 mg/dL — AB (ref 65–99)
GLUCOSE-CAPILLARY: 127 mg/dL — AB (ref 65–99)
Glucose-Capillary: 224 mg/dL — ABNORMAL HIGH (ref 65–99)

## 2018-03-08 LAB — BASIC METABOLIC PANEL
Anion gap: 12 (ref 5–15)
BUN: 6 mg/dL (ref 6–20)
CALCIUM: 8.4 mg/dL — AB (ref 8.9–10.3)
CO2: 18 mmol/L — ABNORMAL LOW (ref 22–32)
CREATININE: 0.53 mg/dL (ref 0.44–1.00)
Chloride: 108 mmol/L (ref 101–111)
GFR calc Af Amer: >60 mL/min (ref >60)
GFR calc non Af Amer: >60 mL/min (ref >60)
Glucose, Bld: 152 mg/dL — ABNORMAL HIGH (ref 65–99)
Potassium: 3.5 mmol/L (ref 3.5–5.1)
SODIUM: 138 mmol/L (ref 135–145)

## 2018-03-08 IMAGING — CT CT ABD-PELV W/ CM
2 of 5 series · 16 of 46 positions shown, 18 images · IV contrast (ISOVUE)
Comparison: CT 09/19/2017

CLINICAL DATA: Abdominal pain with nausea and vomiting

EXAM:
CT ABDOMEN AND PELVIS WITH CONTRAST
TECHNIQUE: Multidetector CT imaging of the abdomen and pelvis was performed
using the standard protocol following bolus administration of
intravenous contrast.
CONTRAST:  100mL MBIAHN-JNN IOPAMIDOL (MBIAHN-JNN) INJECTION 61%

[Series 2: axial st · axial · 0.63mm/px · z∈[-383,-18]mm · 13 of 83 slices shown, 15 images]
[im 5/83  soft-tissue]
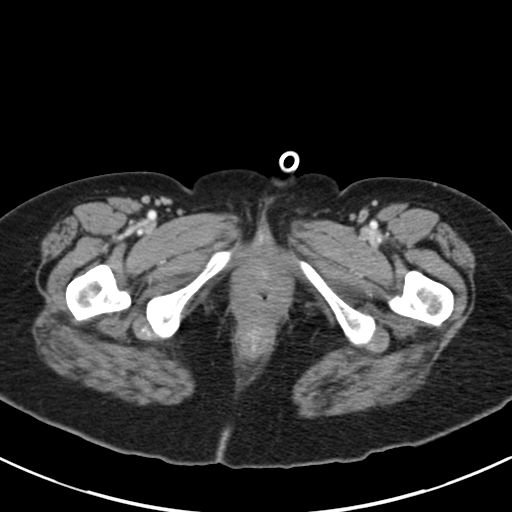
[im 5/83  bone]
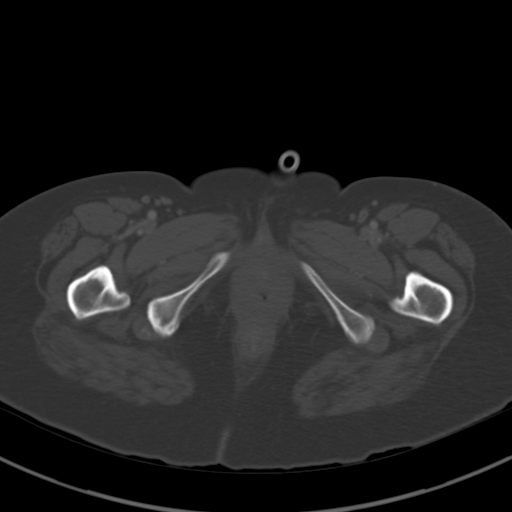
[im 10/83  soft-tissue]
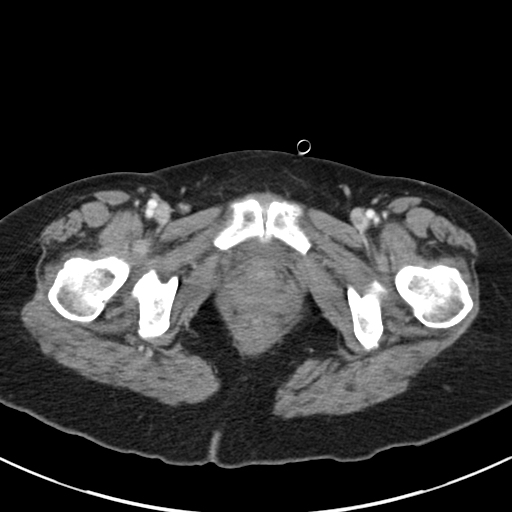
[im 20/83  soft-tissue]
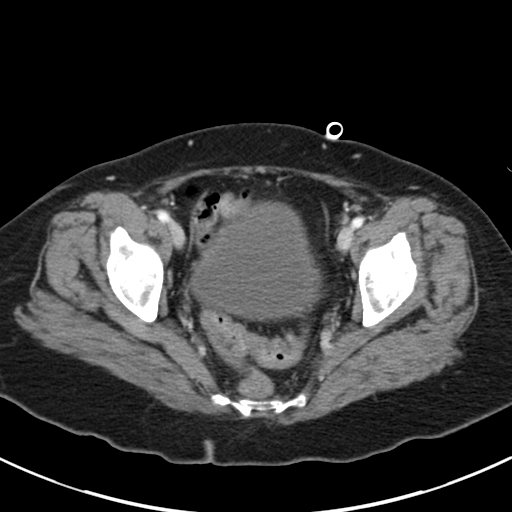
[im 25/83  soft-tissue]
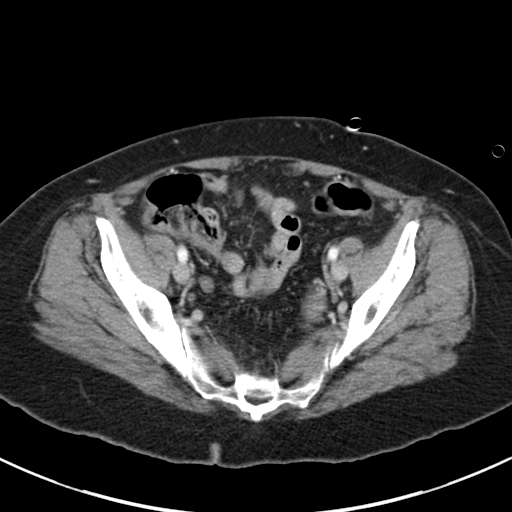
[im 29/83  soft-tissue]
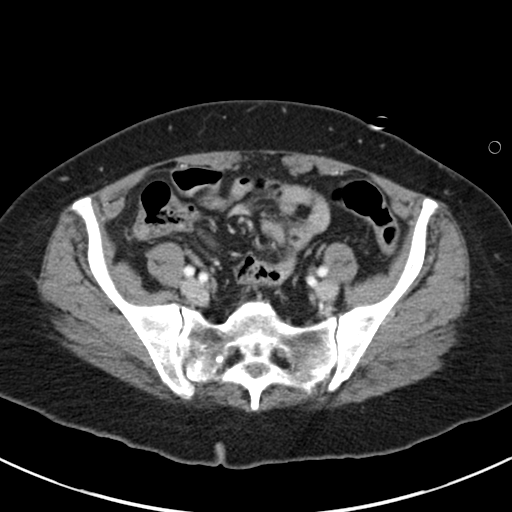
[im 34/83  soft-tissue]
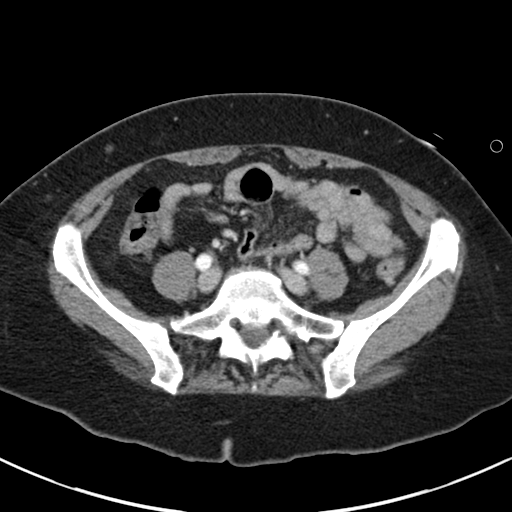
[im 44/83  soft-tissue]
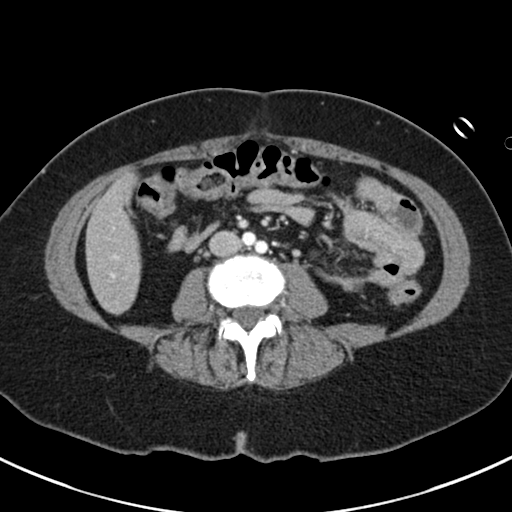
[im 49/83  soft-tissue]
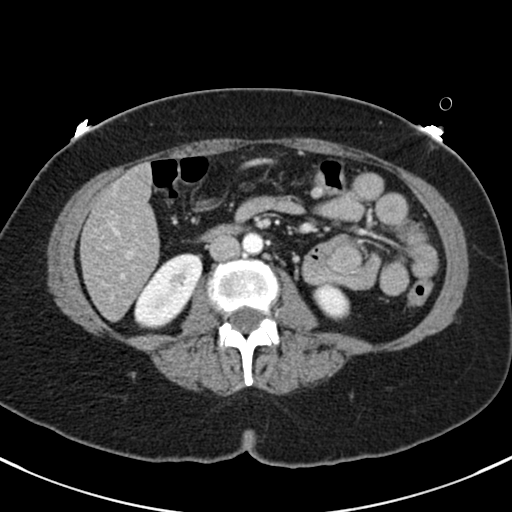
[im 54/83  soft-tissue]
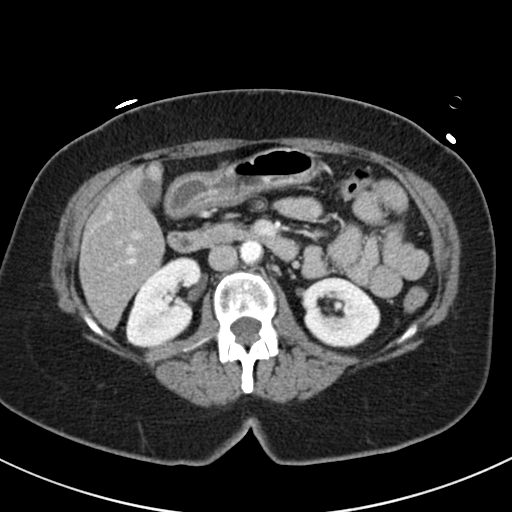
[im 54/83  bone]
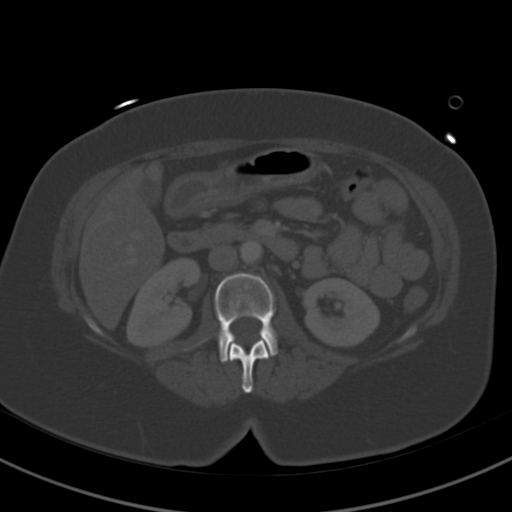
[im 58/83  soft-tissue]
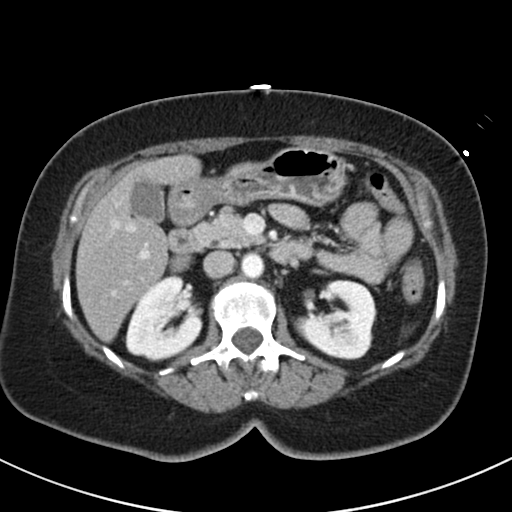
[im 63/83  soft-tissue]
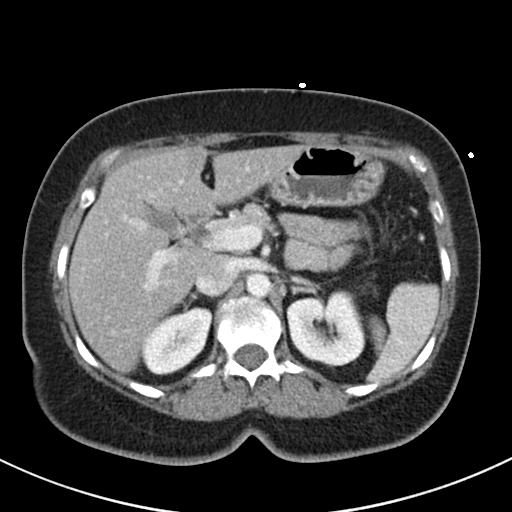
[im 73/83  soft-tissue]
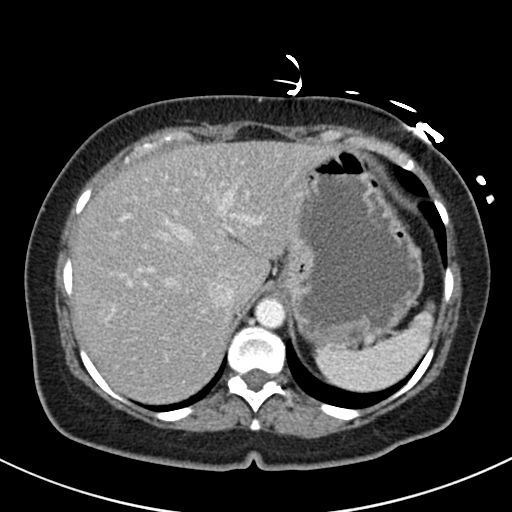
[im 78/83  soft-tissue]
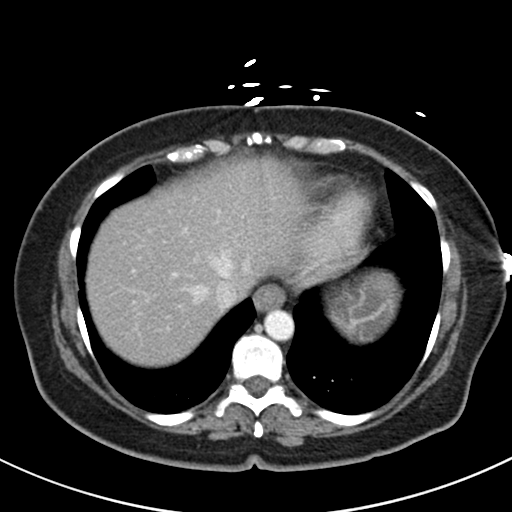

[Series 5: coronal st · coronal · 0.70mm/px · 3 of 81 slices shown]
[im 27/81  soft-tissue]
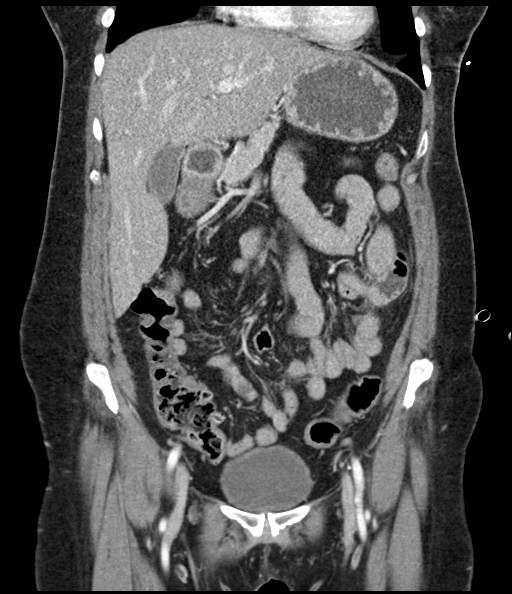
[im 36/81  soft-tissue]
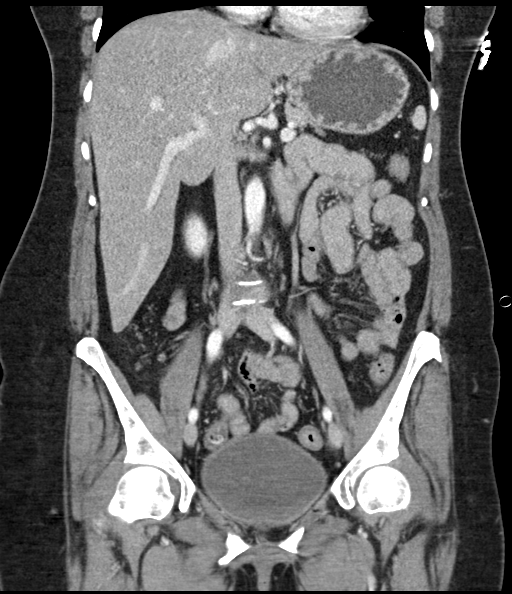
[im 45/81  soft-tissue]
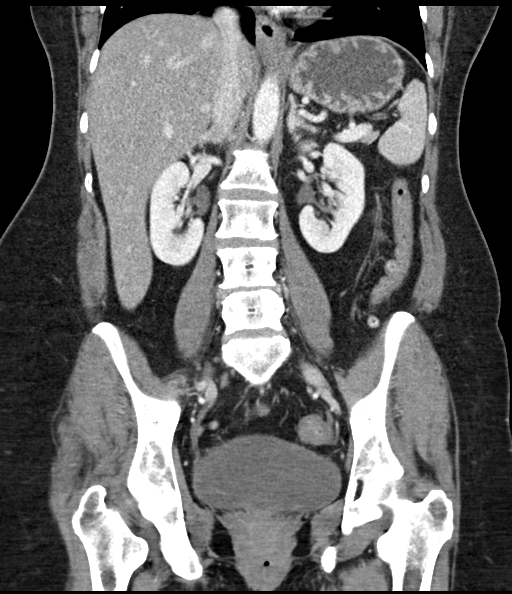

[16 of 46 positions shown; findings below may reference images not displayed]

FINDINGS: Lower chest: Lung bases demonstrate no acute consolidation or
effusion. Normal heart size.

Hepatobiliary: Mild steatosis. No calcified gallstone or biliary
dilatation.

Pancreas: Unremarkable. No pancreatic ductal dilatation or
surrounding inflammatory changes.

Spleen: Normal in size without focal abnormality.

Adrenals/Urinary Tract: Adrenal glands are unremarkable. Kidneys are
normal, without renal calculi, focal lesion, or hydronephrosis.
Bladder is unremarkable.

Stomach/Bowel: Stomach is within normal limits. Appendix appears
normal. No evidence of bowel wall thickening, distention, or
inflammatory changes. Collapsed appearance of the distal colon.
Colon diverticular disease without acute inflammation.

Vascular/Lymphatic: No significant vascular findings are present. No
enlarged abdominal or pelvic lymph nodes.

Reproductive: Status post hysterectomy. No adnexal masses.

Other: Negative for free air or free fluid. Small fat in the
umbilical region.

Musculoskeletal: No acute or significant osseous findings.
IMPRESSION: 1. No CT evidence for acute intra-abdominal or pelvic abnormality.
2. Colon diverticular disease without acute inflammation.
3. Hepatic steatosis.

## 2018-03-08 MED ORDER — INSULIN ASPART 100 UNIT/ML ~~LOC~~ SOLN: 0.0000 [IU] | Freq: Every day | SUBCUTANEOUS | Status: DC

## 2018-03-08 MED ORDER — CEPHALEXIN 500 MG PO CAPS: 500.0000 mg | ORAL_CAPSULE | Freq: Two times a day (BID) | ORAL | 0 refills | Status: DC

## 2018-03-08 MED ORDER — INSULIN GLARGINE 100 UNIT/ML ~~LOC~~ SOLN
7.0000 [IU] | Freq: Once | SUBCUTANEOUS | Status: AC
  Administered 2018-03-08: 7 [IU] via SUBCUTANEOUS
  Filled 2018-03-08: qty 0.07

## 2018-03-08 MED ORDER — METRONIDAZOLE 500 MG PO TABS: 500.0000 mg | ORAL_TABLET | Freq: Three times a day (TID) | ORAL | 0 refills | Status: AC

## 2018-03-08 MED ORDER — INSULIN ASPART 100 UNIT/ML ~~LOC~~ SOLN
3.0000 [IU] | Freq: Three times a day (TID) | SUBCUTANEOUS | Status: DC
  Administered 2018-03-08: 3 [IU] via SUBCUTANEOUS

## 2018-03-08 MED ORDER — INSULIN GLARGINE 100 UNIT/ML ~~LOC~~ SOLN
15.0000 [IU] | Freq: Every day | SUBCUTANEOUS | Status: DC
  Filled 2018-03-08: qty 0.15

## 2018-03-08 MED ORDER — METOCLOPRAMIDE HCL 5 MG PO TABS
5.0000 mg | ORAL_TABLET | Freq: Three times a day (TID) | ORAL | Status: DC
Start: 2018-03-08 — End: 2018-03-08

## 2018-03-08 MED ORDER — INSULIN ASPART 100 UNIT/ML ~~LOC~~ SOLN
0.0000 [IU] | Freq: Three times a day (TID) | SUBCUTANEOUS | Status: DC
  Administered 2018-03-08: 5 [IU] via SUBCUTANEOUS

## 2018-03-08 NOTE — Plan of Care (Signed)
Patieent progressing with care plan goals.

## 2018-03-10 NOTE — Discharge Summary (Signed)
[facility] Discharge Summary   Patient: [name] [name] WRU:[id number]   PCP: Jinny Sanders, MD DOB: [birth year]   Date of admission: 03/06/2018   Date of discharge: 03/08/2018   Discharge Diagnoses:  Principal Problem:   DKA (diabetic ketoacidoses) ([place]) Active Problems:   Hypothyroidism   Generalized anxiety disorder   Essential hypertension   Type 2 diabetes mellitus with ketoacidosis without coma, with long-term current use of insulin ([name])   Gastroparesis due to DM ([place])   Colitis presumed infectious   Chronic systolic heart failure ([name])   Chronic chest pain   Colitis   Admitted From: home Disposition:  home  Recommendations for Outpatient Follow-up:  1. Please follow-up with PCP in 1-2 weeks.  Follow-up Information    Jinny Sanders, MD. Schedule an appointment as soon as possible for a visit in 1 week(s).   Specialty:  Family Medicine Contact information: [place] [phone]          Diet recommendation: Carb modified diet  Activity: The patient is advised to gradually reintroduce usual activities.  Discharge Condition: good  Code Status: Full code  History of present illness: As per the H and P dictated on admission, "[name] [name] is a 57 y.o. female with medical history significant for uncontrolled insulin-dependent diabetes mellitus, diabetic gastroparesis, hypothyroidism, and chronic chest pain with normal coronaries on cardiac cath 1 year ago, now presenting to the emergency department for evaluation of abdominal pain, nausea, vomiting, diarrhea, and elevated blood sugars.  Patient reports that she had been in her usual state until yesterday morning when she developed diffuse abdominal tenderness with nausea, nonbloody vomiting, and nonbloody diarrhea.  She reports his symptoms have progressed since that time.  She denies fevers or chills, denies recent antibiotic use, denies shortness of breath, denies cough, and  denies dysuria or flank pain.  She reports chest pain that is chronic and unchanged."  Hospital Course:  Summary of her active problems in the hospital is as following. 1.  DKA. Uncontrolled diabetes. Hemoglobin A1c 12.9 last night. Diabetic gastroparesis. Anion gap resolved for now. Started on IV fluids as well as IV insulin drip initially. To tolerate oral diet.  Will transition to home regimen on discharge. Continue Reglan.  2.  Abdominal pain. Colitis. Patient had abdominal pain CT abdomen and pelvis are performed shows diffuse wall thickening. C. difficile by PCR negative. Patient started on IV ceftriaxone and Flagyl.  Tolerating soft diet. We will continue Keflex Flagyl on discharge.  3.  Chronic chest pain. Normal coronaries in 2018. No further work  Aspirin and ARB continue.  4.  Essential hypertension. Chronic systolic CHF Blood pressure elevated in the ED, continue home regimen. Hypovolemic on admission, currently euvolemic.   5.  Hypothyroidism. Continue Synthroid.  All other chronic medical condition were stable during the hospitalization.  Patient was ambulatory without any assistance. On the day of the discharge the patient's [place], and no other acute medical condition were reported by patient. the patient was felt safe to be discharge at home with family.  Procedures and Results:  none   Consultations:  none  DISCHARGE MEDICATION: Allergies as of 03/08/2018      Reactions   Codeine Hives, Anxiety, Other (See Comments)      Medication List    STOP taking these medications   ONETOUCH VERIO test strip Generic drug:  glucose blood     TAKE these medications   aspirin 81  MG chewable tablet Chew 1 tablet (81 mg total) by mouth daily.   atorvastatin 80 MG tablet Commonly known as:  LIPITOR Take 1 tablet (80 mg total) by mouth daily at 6 PM.   BASAGLAR KWIKPEN 100 UNIT/ML Sopn Inject 0.22 mLs (22 Units total) into the skin at  bedtime.   cephALEXin 500 MG capsule Commonly known as:  KEFLEX Take 1 capsule (500 mg total) by mouth 2 (two) times daily.   ezetimibe 10 MG tablet Commonly known as:  ZETIA Take 1 tablet (10 mg total) by mouth daily.   insulin lispro 100 UNIT/ML KiwkPen Commonly known as:  HUMALOG KWIKPEN Inject into skin 3 times a day at meal time according to the following sliding scale: CBG 70-120 - 0 units / CBG 121-150 - 1 unit / CBG 151-200 - 2 units / CBG 201-250 - 3 units / CBG 251-300 - 5 units / CBG 301-350 - 7 units / CBG 351-400+ - 9 units  YOU MUST FOLLOW A STRICT DIABETIC DIET   levothyroxine 150 MCG tablet Commonly known as:  SYNTHROID, LEVOTHROID Take 1 tablet (150 mcg total) by mouth daily.   losartan 25 MG tablet Commonly known as:  COZAAR Take 25 mg by mouth daily.   metoCLOPramide 5 MG tablet Commonly known as:  REGLAN TAKE 1 TABLET BY MOUTH 4 TIMES A DAY BEFORE MEALS AND AT BEDTIME   metoprolol succinate 25 MG 24 hr tablet Commonly known as:  TOPROL-XL Take 25 mg by mouth daily.   metroNIDAZOLE 500 MG tablet Commonly known as:  FLAGYL Take 1 tablet (500 mg total) by mouth 3 (three) times daily for 5 days.   nitroGLYCERIN 0.2 mg/hr patch Commonly known as:  NITRODUR - Dosed in mg/24 hr Place 1 patch (0.2 mg total) onto the skin daily as needed. What changed:  when to take this   ondansetron 4 MG tablet Commonly known as:  ZOFRAN TAKE 1 TABLET BY MOUTH EVERY 6 HOURS AS NEEDED FOR NAUSEA AND VOMITING   onetouch ultrasoft lancets Use to check blood sugar daily as needed.  Dx: E11.10   pantoprazole 40 MG tablet Commonly known as:  PROTONIX Take 1 tablet (40 mg total) by mouth daily at 12 noon.   Pen Needles 31G X 8 MM Misc Use to inject insulin 4 times a day.  Dx: E11.10   promethazine 25 MG suppository Commonly known as:  PHENERGAN Place 1 suppository (25 mg total) rectally every 6 (six) hours as needed for nausea or vomiting.   RELPAX 40 MG  tablet Generic drug:  eletriptan ONE TABLET BY MOUTH AT ONSET OF HEADACHE. MAY REPEAT IN 2 HOURS IF HEADACHE PERSISTS OR RECURS.   sertraline 50 MG tablet Commonly known as:  ZOLOFT Take 1 tablet (50 mg total) by mouth daily.   Vitamin D (Ergocalciferol) 50000 units Caps capsule Commonly known as:  DRISDOL Take 1 capsule (50,000 Units total) by mouth every 7 (seven) days.      Allergies  Allergen Reactions  . Codeine Hives, Anxiety and Other (See Comments)   Discharge Instructions    Diet - low sodium heart healthy   Complete by:  As directed    Discharge instructions   Complete by:  As directed    It is important that you read following instructions as well as go over your medication list with RN to help you understand your care after this hospitalization.  Discharge Instructions: Please follow-up with PCP in one week  Please request your  primary care physician to go over all Hospital Tests and Procedure/Radiological results at the follow up,  Please get all Hospital records sent to your PCP by signing hospital release before you go home.   Do not take more than prescribed Pain, Sleep and Anxiety Medications. You were cared for by a hospitalist during your hospital stay. If you have any questions about your discharge medications or the care you received while you were in the hospital after you are discharged, you can call the unit and ask to speak with the hospitalist on call if the hospitalist that took care of you is not available.  Once you are discharged, your primary care physician will handle any further medical issues. Please note that NO REFILLS for any discharge medications will be authorized once you are discharged, as it is imperative that you return to your primary care physician (or establish a relationship with a primary care physician if you do not have one) for your aftercare needs so that they can reassess your need for medications and monitor your lab values. You  Must read complete instructions/literature along with all the possible adverse reactions/side effects for all the Medicines you take and that have been prescribed to you. Take any new Medicines after you have completely understood and accept all the possible adverse reactions/side effects. Wear Seat belts while driving. If you have smoked or chewed Tobacco in the last 2 yrs please stop smoking and/or stop any Recreational drug use.   Increase activity slowly   Complete by:  As directed      Discharge Exam: Filed Weights   03/06/18 1453 03/08/18 0100 03/08/18 0421  Weight: 61.2 kg (135 lb) 62.1 kg (136 lb 14.5 oz) 61.8 kg (136 lb 3.9 oz)   Vitals:   03/08/18 0421 03/08/18 0731  BP: 130/85 (!) 109/58  Pulse: 86 85  Resp: 18 18  Temp: 98.6 F (37 C) 98.5 F (36.9 C)  SpO2: 100% 95%   General: Appear in no distress, no Rash; Oral Mucosa moist. Cardiovascular: S1 and S2 Present, nop Murmur, no JVD Respiratory: Bilateral Air entry present and Clear to Auscultation, no Crackles, no wheezes Abdomen: Bowel Sound present, Soft and mild tenderness Extremities: no Pedal edema, no calf tenderness Neurology: Grossly no focal neuro deficit.  The results of significant diagnostics from this hospitalization (including imaging, microbiology, ancillary and laboratory) are listed below for reference.    Significant Diagnostic Studies: Ct Abdomen Pelvis W Contrast  Result Date: 03/06/2018 CLINICAL DATA:  Abdominal pain with nausea and vomiting EXAM: CT ABDOMEN AND PELVIS WITH CONTRAST TECHNIQUE: Multidetector CT imaging of the abdomen and pelvis was performed using the standard protocol following bolus administration of intravenous contrast. CONTRAST:  126mL ISOVUE-300 IOPAMIDOL (ISOVUE-300) INJECTION 61% COMPARISON:  12/26/2017, 09/19/2017, 06/03/2017 FINDINGS: Lower chest: No acute abnormality. Hepatobiliary: No focal liver abnormality is seen. No gallstones, gallbladder wall thickening, or biliary  dilatation. Pancreas: Unremarkable. No pancreatic ductal dilatation or surrounding inflammatory changes. Spleen: Normal in size without focal abnormality. Adrenals/Urinary Tract: Adrenal glands are unremarkable. Kidneys are normal, without renal calculi, focal lesion, or hydronephrosis. Bladder is unremarkable. Stomach/Bowel: Stomach slightly dilated. No dilated small bowel. Diffuse colon wall thickening with mucosal enhancement. Normal appendix. Vascular/Lymphatic: No significant vascular findings are present. No enlarged abdominal or pelvic lymph nodes. Reproductive: Status post hysterectomy. No adnexal masses. Other: No ascites or free air.  Fat containing umbilical hernia. Musculoskeletal: No acute or significant osseous findings. IMPRESSION: 1. Diffuse wall thickening of the colon with prominent  mucosal enhancement suspect for diffuse colitis of infectious or inflammatory etiology. If history of recent antibiotic usage, could consider C difficile. 2. Otherwise no acute abnormality is seen. Electronically Signed   By: Donavan Foil M.D.   On: 03/06/2018 19:47   Dg Chest Port 1 View  Result Date: 03/06/2018 CLINICAL DATA:  Chest pain EXAM: PORTABLE CHEST 1 VIEW COMPARISON:  Chest radiograph 02/02/2018 FINDINGS: The heart size and mediastinal contours are within normal limits. Both lungs are clear. The visualized skeletal structures are unremarkable. IMPRESSION: No active disease. Electronically Signed   By: Ulyses Jarred M.D.   On: 03/06/2018 21:19   Microbiology: Recent Results (from the past 240 hour(s))  C difficile quick scan w PCR reflex     Status: None   Collection Time: 03/07/18  5:54 PM  Result Value Ref Range Status   C Diff antigen NEGATIVE NEGATIVE Final   C Diff toxin NEGATIVE NEGATIVE Final   C Diff interpretation No C. difficile detected.  Final    Comment: Performed at [facility], [place] [address] [place], [place] [address]    Labs: CBC: Recent Labs  Lab 03/06/18 1451  03/07/18 0409  WBC 18.3* 15.9*  NEUTROABS 16.3* 11.8*  HGB 15.3* 13.9  HCT 44.4 41.4  MCV 84.7 84.3  PLT 383 725   Basic Metabolic Panel: Recent Labs  Lab 03/06/18 1451 03/07/18 0005 03/07/18 0409 03/07/18 0917 03/08/18 0331  NA 137 139 137 134* 138  K 3.7 3.3* 3.3* 3.0* 3.5  CL 103 109 111 107 108  CO2 14* 15* 17* 18* 18*  GLUCOSE 557* 323* 174* 183* 152*  BUN 12 9 9 11 6   CREATININE 0.89 0.71 0.53 0.58 0.53  CALCIUM 9.3 9.2 8.9 8.6* 8.4*   Liver Function Tests: Recent Labs  Lab 03/06/18 1451  AST 18  ALT 13*  ALKPHOS 112  BILITOT 1.0  PROT 7.9  ALBUMIN 4.4   No results for input(s): LIPASE, AMYLASE in the last 168 hours. No results for input(s): AMMONIA in the last 168 hours. Cardiac Enzymes: Recent Labs  Lab 03/07/18 0005 03/07/18 0409  TROPONINI <0.03 <0.03   BNP (last 3 results) No results for input(s): BNP in the last 8760 hours. CBG: Recent Labs  Lab 03/07/18 2024 03/07/18 2326 03/08/18 0417 03/08/18 0816 03/08/18 1207  GLUCAP 166* 98 157* 127* 224*   Time spent: 35 minutes  Signed:  Berle Mull  [facility] 03/08/2018, 5:59 PM

## 2018-03-11 ENCOUNTER — Telehealth: Payer: Self-pay

## 2018-03-11 NOTE — Telephone Encounter (Signed)
Transition Care Management Follow-up Telephone Call   Date discharged? 03/08/2018   How have you been since you were released from the hospital? I am doing okay, I am really tired and have no energy but I am okay."   Do you understand why you were in the hospital? "Yes, my blood sugars were high and they found an infection in my abdomen on the CT."   Do you understand the discharge instructions? Yes   Where were you discharged to? Home   Items Reviewed:  Medications reviewed: Yes  Allergies reviewed: Yes  Dietary changes reviewed: No changes, to continue on diabetic diet.  Referrals reviewed: No referrals at this time.  Sees Cardiology in May.   Functional Questionnaire:   Activities of Daily Living (ADLs):   She states they are independent in the following: All activities of daily living: dressing, bathing, toileting/grooming, ambulation and feeding self. States they require assistance with the following:  Independent with all ADL'S   Any transportation issues/concerns?: None, patient can drive.    Any patient concerns?  No concerns or questions at this time.    Confirmed importance and date/time of follow-up visits scheduled Yes  Provider Appointment booked with Dr. Diona Browner for 03/13/18 at 2:30  Confirmed with patient if condition begins to worsen call PCP or go to the ER.  Patient was given the office number and encouraged to call back with question or concerns.  : Yes

## 2018-03-13 ENCOUNTER — Encounter: Payer: Self-pay | Admitting: Family Medicine

## 2018-03-13 ENCOUNTER — Ambulatory Visit: Payer: 59 | Admitting: Family Medicine

## 2018-03-13 ENCOUNTER — Other Ambulatory Visit: Payer: Self-pay

## 2018-03-13 VITALS — BP 90/70 | HR 92 | Temp 98.3°F | Ht 60.6 in | Wt 137.0 lb

## 2018-03-13 DIAGNOSIS — E1143 Type 2 diabetes mellitus with diabetic autonomic (poly)neuropathy: Secondary | ICD-10-CM | POA: Diagnosis not present

## 2018-03-13 DIAGNOSIS — K529 Noninfective gastroenteritis and colitis, unspecified: Secondary | ICD-10-CM

## 2018-03-13 DIAGNOSIS — Z794 Long term (current) use of insulin: Secondary | ICD-10-CM

## 2018-03-13 DIAGNOSIS — I428 Other cardiomyopathies: Secondary | ICD-10-CM

## 2018-03-13 DIAGNOSIS — K3184 Gastroparesis: Secondary | ICD-10-CM | POA: Diagnosis not present

## 2018-03-13 DIAGNOSIS — E1110 Type 2 diabetes mellitus with ketoacidosis without coma: Secondary | ICD-10-CM

## 2018-03-13 DIAGNOSIS — E0810 Diabetes mellitus due to underlying condition with ketoacidosis without coma: Secondary | ICD-10-CM

## 2018-03-13 MED ORDER — ATORVASTATIN CALCIUM 80 MG PO TABS: 80.0000 mg | ORAL_TABLET | Freq: Every day | ORAL | 3 refills | Status: DC

## 2018-03-13 NOTE — Assessment & Plan Note (Signed)
Followed by Cardiology 

## 2018-03-13 NOTE — Assessment & Plan Note (Signed)
Resolved with IV fluids and insulin.  pt with brittle DM.Marland Kitchen Needs ENDO care.   Needs referral to new endo.. Pt will call with name ASAP.

## 2018-03-13 NOTE — Assessment & Plan Note (Signed)
Improved symtponms s/p course of antibiotics. abd exam today in office: only mild ttp

## 2018-03-13 NOTE — Assessment & Plan Note (Signed)
Need referral to ENDO.  Review diet and exercsie.  pt is compliant with meds.   Will increase Basalgar by 2 Units  And likely titrate up further if ENDO visit will be a while from now.

## 2018-03-13 NOTE — Patient Instructions (Addendum)
Let me know what name of ENDO with [facility] is for referral to be sent.  If ENDO referral will take a while.. increase Balsalgar to 24 units daily.. follow blood sugars and call if FBS remaining > 120.  Work on low [employer], and increase exercise.

## 2018-03-13 NOTE — Assessment & Plan Note (Signed)
Continue reglan

## 2018-03-13 NOTE — Progress Notes (Signed)
Subjective:    Patient ID: [name] [name], female    DOB: 11/06/1961, 57 y.o.   MRN: [id number]  HPI    57 year old female presents for follow up 2  hospitalizatios  02/02/2018 to 02/04/2018 For DKA 03/06/2018 to 03/08/3018 For chest pain/abdominal pain found to again be in DKA  Per H and P: femalewith medical history significant foruncontrolled insulin-dependent diabetes mellitus, diabetic gastroparesis, hypothyroidism, and chronic chest pain with normal coronaries on cardiac cath 1 year ago, now presenting to the emergency department for evaluation of abdominal pain, nausea, vomiting, diarrhea, and elevated blood sugars. Patient reports that she had been in her usual state until yesterday morning when she developed diffuse abdominal tenderness with nausea, nonbloody vomiting, and nonbloody diarrhea. She reports his symptoms have progressed since that time. She denies fevers or chills, denies recent antibiotic use, denies shortness of breath, denies cough, and denies dysuria or flank pain. She reports chest pain that is chronic and unchanged  Hospital course copied below: 1.DKA. Uncontrolled diabetes. Hemoglobin A1c 12.9  Diabetic gastroparesis. Anion gap resolved for now. Started on IV fluids as well as IV insulin drip initially. To tolerate oral diet.  Will transition to home regimen on discharge. Continue Reglan.  2.Abdominal pain. Colitis. Patient had abdominal pain CT abdomen and pelvis are performed shows diffuse wall thickening. C. difficile by PCR negative. Patient started on IV ceftriaxone and Flagyl.  Tolerating soft diet. We will continue Keflex Flagyl on discharge.  3.Chronic chest pain. Normal coronaries in 2018. No further work  Aspirin and ARB continue.   03/13/18 Today she reports no further abdominal pain. Complete antibiotics today. No diarrhea. No fever. No dysuria, no vaginal discharge.  Stable chronic mild chest pain  She is currently on Balsalgar  22 units daily. Using SSI.Marland Kitchen 2-8 units Humalog before meals. FBS 150-170, one 70  no sugars > 200  Since discharge   She has a  Proofreader with her insurance helping her get set up with new ENDO through [employer]. She needs a referral for this..needs an order.   Review of Systems  Constitutional: Negative for fatigue and fever.  HENT: Negative for congestion.   Eyes: Negative for pain.  Respiratory: Negative for cough and shortness of breath.   Cardiovascular: Negative for chest pain, palpitations and leg swelling.  Gastrointestinal: Negative for abdominal pain.  Genitourinary: Negative for dysuria and vaginal bleeding.  Musculoskeletal: Negative for back pain.  Neurological: Negative for syncope, light-headedness and headaches.  Psychiatric/Behavioral: Negative for dysphoric mood.       Objective:   Physical Exam  Constitutional: Vital signs are normal. She appears well-developed and well-nourished. She is cooperative.  Non-toxic appearance. She does not appear ill. No distress.  HENT:  Head: Normocephalic.  Right Ear: Hearing, tympanic membrane, external ear and ear canal normal. Tympanic membrane is not erythematous, not retracted and not bulging.  Left Ear: Hearing, tympanic membrane, external ear and ear canal normal. Tympanic membrane is not erythematous, not retracted and not bulging.  Nose: No mucosal edema or rhinorrhea. Right sinus exhibits no maxillary sinus tenderness and no frontal sinus tenderness. Left sinus exhibits no maxillary sinus tenderness and no frontal sinus tenderness.  Mouth/Throat: Uvula is midline, oropharynx is clear and moist and mucous membranes are normal.  Eyes: Pupils are equal, round, and reactive to light. Conjunctivae, EOM and lids are normal. Lids are everted and swept, no foreign bodies found.  Neck: Trachea normal and normal range of motion. Neck supple.  Carotid bruit is not present. No thyroid mass and no thyromegaly present.    Cardiovascular: Normal rate, regular rhythm, S1 normal, S2 normal, normal heart sounds, intact distal pulses and normal pulses. Exam reveals no gallop and no friction rub.  No murmur heard. Pulmonary/Chest: Effort normal and breath sounds normal. No tachypnea. No respiratory distress. She has no decreased breath sounds. She has no wheezes. She has no rhonchi. She has no rales.  Abdominal: Soft. Normal appearance and bowel sounds are normal. There is no hepatosplenomegaly. There is generalized tenderness. There is no rigidity, no guarding and no CVA tenderness.  Neurological: She is alert.  Skin: Skin is warm, dry and intact. No rash noted.  Psychiatric: Her speech is normal and behavior is normal. Judgment and thought content normal. Her mood appears not anxious. Cognition and memory are normal. She does not exhibit a depressed mood.          Assessment & Plan:

## 2018-04-06 ENCOUNTER — Other Ambulatory Visit: Payer: Self-pay | Admitting: Internal Medicine

## 2018-04-11 ENCOUNTER — Encounter: Payer: Self-pay | Admitting: Cardiology

## 2018-04-11 ENCOUNTER — Ambulatory Visit: Payer: 59 | Admitting: Physician Assistant

## 2018-04-11 ENCOUNTER — Encounter: Payer: Self-pay | Admitting: Physician Assistant

## 2018-04-11 VITALS — BP 112/74 | HR 80 | Ht 59.5 in | Wt 138.4 lb

## 2018-04-11 DIAGNOSIS — R079 Chest pain, unspecified: Secondary | ICD-10-CM

## 2018-04-11 DIAGNOSIS — E785 Hyperlipidemia, unspecified: Secondary | ICD-10-CM

## 2018-04-11 DIAGNOSIS — I428 Other cardiomyopathies: Secondary | ICD-10-CM | POA: Diagnosis not present

## 2018-04-11 DIAGNOSIS — I5022 Chronic systolic (congestive) heart failure: Secondary | ICD-10-CM | POA: Diagnosis not present

## 2018-04-11 NOTE — Progress Notes (Addendum)
Cardiology Office Note   Date:  04/11/2018   ID:  [name] [name], DOB 1961-11-24, MRN [id number]  PCP:  Jinny Sanders, MD  Cardiologist: Dr. Percival Spanish, 01/07/2018 Rosaria Ferries, PA-C   Chief Complaint  Patient presents with  . Follow-up    dizziness noticed, if HR becomes 80 or above she experiences chest pains, SOB, denies swelling in hands/feet.    History of Present Illness: [name] [name] is a 57 y.o. 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   [name] [name] presents for cardiology follow up.   She gets a little winded at times, not bad. No recent change, some days are better than others. Does not wake with LE edema. Does not wake w/ SOB. No orthopnea.   She has been having dizzy spells. She will feel these when she lies down, if she moves her head or turns over in bed, or if she stands up quickly. She has not discussed with Dr Diona Browner.   Occasional palpitations, skipped beats, single beats, not often. Has cut down on caffeine use, drinks more water.   She will have chest pain if her heart rate goes 86 or above and stays there. If she takes a deep breath and relaxes, sx will get better.  She thinks she gets the symptoms with both physical and emotional stress.  The nitro patch is helping, but she is still having the pain.  The pain is not new, she has been having it off and on for a long time.  Her husband mentions that he is concerned she might have sleep apnea.  He falls asleep quickly and does not know if she snores, but in the hospital he mentions that her oxygen saturations were dropping when she was asleep and he is concerned.   Past Medical History:  Diagnosis Date  . Anxiety   . B12  deficiency   . Chest pain 04/05/2017  . GERD (gastroesophageal reflux disease)   . Hyperlipidemia   . Hypertension    [name] [name]  . Hypothyroidism   . IBS (irritable bowel syndrome)   . Intermittent vertigo   . 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 Leg cramping    "@ night" (09/18/2013)  . Migraine    "once q couple months" (09/18/2013)  . Osteoporosis   . Peripheral neuropathy   . Type II diabetes mellitus ([place])   . Umbilical hernia    unrepaired (09/18/2013)    Past Surgical History:  Procedure Laterality Date  . CESAREAN SECTION  1989  . LAPAROSCOPIC ASSISTED VAGINAL HYSTERECTOMY  09/23/2012   Procedure: LAPAROSCOPIC ASSISTED VAGINAL HYSTERECTOMY;  Surgeon: Linda Hedges, DO;  Location: [facility];  Service: Gynecology;  Laterality: N/A;  pull Dr Gregor Hams instrument  . LEFT HEART CATH AND CORONARY ANGIOGRAPHY N/A 02/11/2017   Procedure: Left Heart Cath and Coronary Angiography;  Surgeon: Lorretta Harp, MD;  Location: [facility];  Service: Cardiovascular;  Laterality: N/A;  . TUBAL LIGATION Bilateral 1992    Current Outpatient Medications  Medication Sig Dispense Refill  . aspirin 81 MG chewable tablet Chew 1 tablet (81 mg total) by mouth daily. 30 tablet 0  . atorvastatin (LIPITOR) 80 MG  tablet Take 1 tablet (80 mg total) by mouth daily at 6 PM. 90 tablet 3  . ezetimibe (ZETIA) 10 MG tablet Take 1 tablet (10 mg total) by mouth daily. 30 tablet 11  . Insulin Glargine (BASAGLAR KWIKPEN) 100 UNIT/ML SOPN Inject 0.22 mLs (22 Units total) into the skin at bedtime. 10 mL 2  . insulin lispro (HUMALOG KWIKPEN) 100 UNIT/ML KiwkPen Inject into skin 3 times a day at meal time according to the following sliding scale: CBG 70-120 - 0 units / CBG 121-150 - 1 unit / CBG 151-200 - 2 units / CBG 201-250 - 3 units / CBG 251-300 - 5 units / CBG 301-350 - 7 units / CBG 351-400+ - 9 units  YOU MUST FOLLOW A STRICT DIABETIC DIET 15 mL 0  . Insulin Pen Needle (PEN  NEEDLES) 31G X 8 MM MISC Use to inject insulin 4 times a day.  Dx: E11.10 130 each 11  . Lancets (ONETOUCH ULTRASOFT) lancets Use to check blood sugar daily as needed.  Dx: E11.10 100 each 3  . levothyroxine (SYNTHROID, LEVOTHROID) 150 MCG tablet Take 1 tablet (150 mcg total) by mouth daily. 90 tablet 3  . losartan (COZAAR) 25 MG tablet Take 25 mg by mouth daily.  12  . metoCLOPramide (REGLAN) 5 MG tablet TAKE 1 TABLET BY MOUTH 4 TIMES A DAY BEFORE MEALS AND AT BEDTIME 120 tablet 1  . metoprolol succinate (TOPROL-XL) 25 MG 24 hr tablet Take 25 mg by mouth daily.  12  . nitroGLYCERIN (NITRODUR - DOSED IN MG/24 HR) 0.2 mg/hr patch Place 1 patch (0.2 mg total) onto the skin daily as needed. (Patient taking differently: Place 0.2 mg onto the skin daily. ) 10 patch 12  . ondansetron (ZOFRAN) 4 MG tablet TAKE 1 TABLET BY MOUTH EVERY 6 HOURS AS NEEDED FOR NAUSEA AND VOMITING 30 tablet 0  . pantoprazole (PROTONIX) 40 MG tablet Take 1 tablet (40 mg total) by mouth daily at 12 noon. 30 tablet 11  . promethazine (PHENERGAN) 25 MG suppository Place 1 suppository (25 mg total) rectally every 6 (six) hours as needed for nausea or vomiting. 12 each 0  . RELPAX 40 MG tablet ONE TABLET BY MOUTH AT ONSET OF HEADACHE. MAY REPEAT IN 2 HOURS IF HEADACHE PERSISTS OR RECURS. 10 tablet 2  . sertraline (ZOLOFT) 50 MG tablet Take 1 tablet (50 mg total) by mouth daily. 90 tablet 1  . Vitamin D, Ergocalciferol, (DRISDOL) 50000 units CAPS capsule Take 1 capsule (50,000 Units total) by mouth every 7 (seven) days. (Patient not taking: Reported on 04/11/2018) 12 capsule 0   No current facility-administered medications for this visit.     Allergies:   Codeine    Social History:  The patient  reports that she quit smoking about 27 years ago. Her smoking use included cigarettes. She quit after 0.20 years of use. She has never used smokeless tobacco. She reports that she has current or past drug history. Drug: Marijuana. Frequency:  3.00 times per week. She reports that she does not drink alcohol.   Family History:  The patient's family history includes Alzheimer's disease in her maternal grandmother; Coronary artery disease in her maternal grandmother; Diabetes in her maternal grandmother and other; Heart attack in her maternal grandfather; Heart disease in her other; Heart failure in her brother; Heart failure (age of onset: 80) in her mother.    ROS:  Please see the history of present illness. All other systems are reviewed and negative.  PHYSICAL EXAM: VS:  BP 112/74 (BP Location: Left Arm)   Pulse 80   Ht 4' 11.5" (1.511 m)   Wt 138 lb 6.4 oz (62.8 kg)   LMP 08/11/2012   BMI 27.49 kg/m  , BMI Body mass index is 27.49 kg/m. GEN: Well nourished, well developed, female in no acute distress  HEENT: normal for age  Neck: Minimal JVD, no carotid bruit, no masses Cardiac: RRR; soft murmur, no rubs, or gallops Respiratory: Decreased breath sounds bases bilaterally, normal work of breathing GI: soft, nontender, nondistended, + BS MS: no deformity or atrophy; trace pedal edema; distal pulses are 2+ in all 4 extremities   Skin: warm and dry, no rash Neuro:  Strength and sensation are intact Psych: euthymic mood, full affect   EKG:  EKG is not ordered today.  CATH: 02/11/2017  There is mild left ventricular systolic dysfunction.  LV end diastolic pressure is mildly elevated.   normal coronary arteries and low normal LV function with mild anterior and apical hypokinesia   The left ventricular ejection fraction is 45-50% by visual estimate.  ECHO: 09/20/2017 - Left ventricle: The cavity size was normal. Systolic function was   moderately to severely reduced. The estimated ejection fraction   was in the range of 30% to 35%. Moderate diffuse hypokinesis with   regional variations. Due to tachycardia, there was fusion of   early and atrial contributions to ventricular filling. The study   is not technically  sufficient to allow evaluation of LV diastolic   function. - Mitral valve: There was mild regurgitation. Impressions - The wall motion abnormality has a similar distribution to March   2018, but is more severe. It does not follow typical coronary   distribution and a nonischemic cardiomyopathy is suspected.   Overall LVEF has deteriorated. The regional wall motion   abnormality distribution is suggestive of stress cardiomyopathy,   but this is not a chronic disorder and is only infrequently a   recurrent disorder.  Recent Labs: 12/11/2017: TSH 0.016 02/02/2018: Magnesium 1.9 03/06/2018: ALT 13 03/07/2018: Hemoglobin 13.9; Platelets 350 03/08/2018: BUN 6; Creatinine, Ser 0.53; Potassium 3.5; Sodium 138    Lipid Panel    Component Value Date/Time   CHOL 256 (H) 12/12/2017 2000   TRIG 71 12/12/2017 2000   HDL 55 12/12/2017 2000   CHOLHDL 4.7 12/12/2017 2000   VLDL 14 12/12/2017 2000   LDLCALC 187 (H) 12/12/2017 2000   LDLDIRECT 206.0 07/27/2014 0745     Wt Readings from Last 3 Encounters:  04/11/18 138 lb 6.4 oz (62.8 kg)  03/13/18 137 lb (62.1 kg)  03/08/18 136 lb 3.9 oz (61.8 kg)     Other studies Reviewed: Additional studies/ records that were reviewed today include: Office notes, hospital records and testing.  ASSESSMENT AND PLAN:  1.  Chronic systolic CHF: She has had a minimal increase in her weight, no significant volume overload by exam.  She is not currently on a diuretic.  If her weight remains stable, okay to stay off of it.  2.  Chest pain, moderate risk for cardiac etiology: All testing in the past has been negative.  However, cath was over a year ago and her risk factors are poorly controlled.  Check a Lexiscan Myoview.  If the Lexiscan Myoview is negative, discuss with MD stopping aspirin.  Continue Nitropatch  3.  Hyperlipidemia: LDL in January was 187.  Upon review of previous labs, it has been consistently elevated.  She is currently on  high dose Lipitor at  80 mg a day and Zetia 10 mg a day.  Recheck lipid profile per Dr. Diona Browner or Dr. Percival Spanish.  If her LDL does not come down with diet and medication compliance, refer for PCSK9 inhibitors.  4.  Nonischemic cardiomyopathy: Her blood pressure will not tolerate much in the way of medications.  She is on low-dose beta-blocker and low-dose losartan plus Nitropatch.  5.  GI issues: She has GI issues in addition to the recent colitis.  She has seen Dr. Ardis Hughs in the past.  Follow-up with him as soon as possible.  6.  Possible sleep apnea: I advised the patient that we could give her a questionnaire to fill out regarding the potential for sleep apnea and if she gets a high score, she should have a sleep study.  This was mentioned to the nursing staff after she left, we will mail her the form.   Current medicines are reviewed at length with the patient today.  The patient does not have concerns regarding medicines.  The following changes have been made:  no change  Labs/ tests ordered today include:  No orders of the defined types were placed in this encounter.    Disposition:   FU with Dr. Percival Spanish  Signed, Rosaria Ferries, PA-C  04/11/2018 10:40 AM    [place] Phone: [phone]; Fax: [phone]  This note was written with the assistance of speech recognition software. Please excuse any transcriptional errors.

## 2018-04-11 NOTE — Patient Instructions (Signed)
Medication Instructions:  Continue current medications  If you need a refill on your cardiac medications before your next appointment, please call your pharmacy.  Labwork: None Ordered   Testing/Procedures: Your physician has requested that you have a lexiscan myoview. For further information please visit [email or web address]. Please follow instruction sheet, as given.   Follow-Up: Your physician wants you to follow-up in: 1 Month with Dr Percival Spanish.      Thank you for choosing [facility]!!

## 2018-04-14 ENCOUNTER — Telehealth: Payer: Self-pay | Admitting: Family Medicine

## 2018-04-14 NOTE — Telephone Encounter (Signed)
Last office visit 03/13/2018.  Last refilled Zofran 12/20/2017 for #30 with no refills.  Levothyroxine last refilled 04/03/2017 for #90 with 3 refills by Dr. Cruzita Lederer.  Last TSH & FT4 checked in hospital and both were abnormal on 12/11/2017.  Refill?

## 2018-04-15 NOTE — Telephone Encounter (Signed)
Sent in 30 of thyroid med as well as zofran refill.. Pt need free t3 free t4 and TSH before further refills after this.. Have her schedule lab only visit.

## 2018-04-15 NOTE — Telephone Encounter (Signed)
[name] [name], Please schedule Xela a lab only appointment to have her thyroid levels checked sometime in the next month.

## 2018-04-16 NOTE — Telephone Encounter (Signed)
Appointment 6/10 for labs pt aware

## 2018-04-20 ENCOUNTER — Other Ambulatory Visit: Payer: Self-pay | Admitting: Family Medicine

## 2018-04-21 NOTE — Telephone Encounter (Signed)
Last office visit 03/13/2018.  Last refilled 01/28/2018 for #12 with no refills.  Last Vit D Level 01/21/2018 low at 18.46 ng/ml.  Refill?

## 2018-04-23 ENCOUNTER — Telehealth (HOSPITAL_COMMUNITY): Payer: Self-pay

## 2018-04-23 NOTE — Telephone Encounter (Signed)
Encounter complete. 

## 2018-04-25 ENCOUNTER — Ambulatory Visit (HOSPITAL_COMMUNITY)
Admission: RE | Admit: 2018-04-25 | Discharge: 2018-04-25 | Disposition: A | Discharge status: Home / Self Care | Payer: 59 | Source: Ambulatory Visit | Attending: Cardiovascular Disease | Admitting: Cardiovascular Disease

## 2018-04-25 ENCOUNTER — Telehealth: Payer: Self-pay

## 2018-04-25 DIAGNOSIS — R079 Chest pain, unspecified: Secondary | ICD-10-CM | POA: Diagnosis present

## 2018-04-25 LAB — MYOCARDIAL PERFUSION IMAGING
CHL CUP NUCLEAR SDS: 2
CHL CUP NUCLEAR SRS: 0
CHL CUP NUCLEAR SSS: 2
LVDIAVOL: 54 mL (ref 46–106)
LVSYSVOL: 19 mL
NUC STRESS TID: 1.16
Peak HR: 123 {beats}/min
Rest HR: 79 {beats}/min

## 2018-04-25 MED ORDER — AMINOPHYLLINE 25 MG/ML IV SOLN
75.0000 mg | Freq: Once | INTRAVENOUS | Status: AC
  Administered 2018-04-25: 75 mg via INTRAVENOUS

## 2018-04-25 MED ORDER — REGADENOSON 0.4 MG/5ML IV SOLN
0.4000 mg | Freq: Once | INTRAVENOUS | Status: AC
  Administered 2018-04-25: 0.4 mg via INTRAVENOUS

## 2018-04-25 MED ORDER — TECHNETIUM TC 99M TETROFOSMIN IV KIT
10.8000 | PACK | Freq: Once | INTRAVENOUS | Status: AC | PRN
  Administered 2018-04-25: 10.8 via INTRAVENOUS
  Filled 2018-04-25: qty 11

## 2018-04-25 MED ORDER — TECHNETIUM TC 99M TETROFOSMIN IV KIT
30.8000 | PACK | Freq: Once | INTRAVENOUS | Status: AC | PRN
  Administered 2018-04-25: 30.8 via INTRAVENOUS
  Filled 2018-04-25: qty 31

## 2018-04-25 NOTE — Telephone Encounter (Signed)
Informed patient that I would mail her a Epworth Sleepiness scale sheet that she needs to complete to determine if she needs a sleep study. Patient voiced understanding. Letter mailed to patient and informed patient to contact office once form complete.

## 2018-04-25 NOTE — Telephone Encounter (Signed)
-----   Message from Lonn Georgia, PA-C sent at 04/11/2018  3:46 PM EDT ----- While she was here, her husband mentioned that he was concerned about sleep apnea but I forgot to tell [name] [name].  Can you mail her the Epworth sleepiness scale so she can fill that out and we can figure out if she needs a sleep study?    Thanks

## 2018-05-08 ENCOUNTER — Other Ambulatory Visit: Payer: Self-pay | Admitting: Family Medicine

## 2018-05-08 NOTE — Telephone Encounter (Signed)
Last office visit 03/13/2018.  Last refilled 02/19/2018 for #120 with 1 refill.  Ok to refill?

## 2018-05-19 ENCOUNTER — Other Ambulatory Visit: Payer: 59

## 2018-05-25 ENCOUNTER — Other Ambulatory Visit: Payer: Self-pay

## 2018-05-25 ENCOUNTER — Inpatient Hospital Stay (HOSPITAL_COMMUNITY)
Admission: EM | Admit: 2018-05-25 | Discharge: 2018-05-28 | DRG: 638 | Disposition: A | Discharge status: Home / Self Care | Payer: 59 | Attending: Internal Medicine | Admitting: Internal Medicine

## 2018-05-25 ENCOUNTER — Encounter (HOSPITAL_COMMUNITY): Payer: Self-pay | Admitting: Emergency Medicine

## 2018-05-25 DIAGNOSIS — E1110 Type 2 diabetes mellitus with ketoacidosis without coma: Secondary | ICD-10-CM | POA: Diagnosis not present

## 2018-05-25 DIAGNOSIS — R778 Other specified abnormalities of plasma proteins: Secondary | ICD-10-CM

## 2018-05-25 DIAGNOSIS — K219 Gastro-esophageal reflux disease without esophagitis: Secondary | ICD-10-CM | POA: Diagnosis present

## 2018-05-25 DIAGNOSIS — N179 Acute kidney failure, unspecified: Secondary | ICD-10-CM

## 2018-05-25 DIAGNOSIS — N182 Chronic kidney disease, stage 2 (mild): Secondary | ICD-10-CM | POA: Diagnosis present

## 2018-05-25 DIAGNOSIS — E1122 Type 2 diabetes mellitus with diabetic chronic kidney disease: Secondary | ICD-10-CM | POA: Diagnosis present

## 2018-05-25 DIAGNOSIS — E0810 Diabetes mellitus due to underlying condition with ketoacidosis without coma: Secondary | ICD-10-CM

## 2018-05-25 DIAGNOSIS — I10 Essential (primary) hypertension: Secondary | ICD-10-CM | POA: Diagnosis present

## 2018-05-25 DIAGNOSIS — Z87891 Personal history of nicotine dependence: Secondary | ICD-10-CM

## 2018-05-25 DIAGNOSIS — G8929 Other chronic pain: Secondary | ICD-10-CM

## 2018-05-25 DIAGNOSIS — D72829 Elevated white blood cell count, unspecified: Secondary | ICD-10-CM | POA: Diagnosis not present

## 2018-05-25 DIAGNOSIS — E1142 Type 2 diabetes mellitus with diabetic polyneuropathy: Secondary | ICD-10-CM | POA: Diagnosis present

## 2018-05-25 DIAGNOSIS — Z9119 Patient's noncompliance with other medical treatment and regimen: Secondary | ICD-10-CM

## 2018-05-25 DIAGNOSIS — R0789 Other chest pain: Secondary | ICD-10-CM | POA: Diagnosis present

## 2018-05-25 DIAGNOSIS — R079 Chest pain, unspecified: Secondary | ICD-10-CM

## 2018-05-25 DIAGNOSIS — Z794 Long term (current) use of insulin: Secondary | ICD-10-CM

## 2018-05-25 DIAGNOSIS — E876 Hypokalemia: Secondary | ICD-10-CM | POA: Diagnosis not present

## 2018-05-25 DIAGNOSIS — Z79899 Other long term (current) drug therapy: Secondary | ICD-10-CM

## 2018-05-25 DIAGNOSIS — E860 Dehydration: Secondary | ICD-10-CM | POA: Diagnosis present

## 2018-05-25 DIAGNOSIS — Z7982 Long term (current) use of aspirin: Secondary | ICD-10-CM

## 2018-05-25 DIAGNOSIS — R7989 Other specified abnormal findings of blood chemistry: Secondary | ICD-10-CM

## 2018-05-25 DIAGNOSIS — I130 Hypertensive heart and chronic kidney disease with heart failure and stage 1 through stage 4 chronic kidney disease, or unspecified chronic kidney disease: Secondary | ICD-10-CM | POA: Diagnosis present

## 2018-05-25 DIAGNOSIS — E039 Hypothyroidism, unspecified: Secondary | ICD-10-CM | POA: Diagnosis present

## 2018-05-25 DIAGNOSIS — M810 Age-related osteoporosis without current pathological fracture: Secondary | ICD-10-CM | POA: Diagnosis present

## 2018-05-25 DIAGNOSIS — K3184 Gastroparesis: Secondary | ICD-10-CM | POA: Diagnosis present

## 2018-05-25 DIAGNOSIS — I5022 Chronic systolic (congestive) heart failure: Secondary | ICD-10-CM | POA: Diagnosis present

## 2018-05-25 DIAGNOSIS — E1143 Type 2 diabetes mellitus with diabetic autonomic (poly)neuropathy: Secondary | ICD-10-CM | POA: Diagnosis present

## 2018-05-25 HISTORY — DX: Type 2 diabetes mellitus with ketoacidosis without coma: E11.10

## 2018-05-25 LAB — I-STAT CHEM 8, ED
BUN: 17 mg/dL (ref 6–20)
CHLORIDE: 95 mmol/L — AB (ref 101–111)
CREATININE: 0.6 mg/dL (ref 0.44–1.00)
Calcium, Ion: 0.96 mmol/L — ABNORMAL LOW (ref 1.15–1.40)
Glucose, Bld: >700 mg/dL (ref 65–99)
HEMATOCRIT: 54 % — AB (ref 36.0–46.0)
Hemoglobin: 18.4 g/dL — ABNORMAL HIGH (ref 12.0–15.0)
Potassium: 4.5 mmol/L (ref 3.5–5.1)
Sodium: 128 mmol/L — ABNORMAL LOW (ref 135–145)
TCO2: 14 mmol/L — ABNORMAL LOW (ref 22–32)

## 2018-05-25 MED ORDER — ONDANSETRON HCL 4 MG/2ML IJ SOLN
4.0000 mg | Freq: Once | INTRAMUSCULAR | Status: AC
  Administered 2018-05-26: 4 mg via INTRAVENOUS
  Filled 2018-05-25: qty 2

## 2018-05-25 MED ORDER — FENTANYL CITRATE (PF) 100 MCG/2ML IJ SOLN
50.0000 ug | Freq: Once | INTRAMUSCULAR | Status: AC
  Administered 2018-05-26: 50 ug via INTRAVENOUS
  Filled 2018-05-25: qty 2

## 2018-05-25 MED ORDER — SODIUM CHLORIDE 0.9 % IV BOLUS: 1000.0000 mL | Freq: Once | INTRAVENOUS | Status: DC

## 2018-05-25 MED ORDER — SODIUM CHLORIDE 0.9 % IV BOLUS
500.0000 mL | Freq: Once | INTRAVENOUS | Status: AC
  Administered 2018-05-26: 500 mL via INTRAVENOUS

## 2018-05-25 NOTE — ED Provider Notes (Addendum)
[facility] [place] EMERGENCY DEPARTMENT Provider Note   CSN: [id number] Arrival date & time: 05/25/18  2323     History   Chief Complaint Chief Complaint  Patient presents with  . Chest Pain    HPI [name] [name] is a 57 y.o. female.  57 y/o female with hx of GERD, HLD, hypothyroid, IDDM, CKD, HTN, CHF (LVEF 30-35%), chronic chest pain (normal cardiac catheterization with clear coronaries 1 year ago) presents to the ED for nausea and vomiting.  Patient reports 10 episodes of coffee-ground emesis in the past 24 hours.  Nausea has been persistent.  She has also had chest pain which is generalized, across her chest.  She states that this pain is similar to her chronic chest pain for which she uses a nitro patch.  This was worn earlier today.  Patient also took 324 mg aspirin at home for pain as well.  No associated fevers, bowel changes.  Patient has been noncompliant with her insulin x 3 days, stating "I forgot to take it".  Hx of multiple past admissions for DKA.   LEVEL 5 CAVEAT 2/2 ACUITY OF CONDITION.  The history is provided by the patient. No language interpreter was used.  Chest Pain      Past Medical History:  Diagnosis Date  . Anxiety   . B12 deficiency   . Chest pain 04/05/2017  . GERD (gastroesophageal reflux disease)   . Hyperlipidemia   . Hypertension    [name]  . Hypothyroidism   . IBS (irritable bowel syndrome)   . Intermittent vertigo   . 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 Leg cramping    "@ night" (09/18/2013)  . Migraine    "once q couple months" (09/18/2013)  . Osteoporosis   . Peripheral neuropathy   . Type II diabetes mellitus ([place])   . Umbilical hernia    unrepaired (09/18/2013)    Patient Active Problem List   Diagnosis Date Noted  . Colitis 03/07/2018  . Chronic chest pain   . Chronic systolic heart failure ([place]) 01/14/2018  . Noncompliance 01/14/2018  . Migraines 12/11/2017  . DKA (diabetic  ketoacidoses) ([place]) 12/11/2017  . Chest discomfort 09/18/2017  . DKA, type 1 ([place]) 09/17/2017  . Phrygian cap 06/20/2017  . Lower abdominal pain   . Colitis presumed infectious   . Nonischemic cardiomyopathy ([place]) 03/22/2017  . NSTEMI (non-ST elevated myocardial infarction) ([place]) 02/11/2017  . H. pylori infection 02/09/2017  . Hypomagnesemia 02/09/2017  . Gastroparesis due to DM ([place]) 01/22/2017  . Type 2 diabetes mellitus with ketoacidosis without coma, with long-term current use of insulin ([place]) 07/17/2016  . Atrial fibrillation ([place]) 02/01/2015  . 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 Hepatic lesion 09/18/2013  . Vitamin D deficiency 05/20/2012  . Essential hypertension 02/01/2011  . Osteoporosis 02/01/2011  . B12 deficiency 04/04/2010  . Peripheral neuropathy ([place]) 05/10/2009  . GERD 04/08/2009  . Generalized anxiety disorder 06/22/2008  . Hypothyroidism 04/29/2008  . Hyperlipidemia 04/29/2008  . Irritable bowel syndrome 04/29/2008    Past Surgical History:  Procedure Laterality Date  . CESAREAN SECTION  1989  . LAPAROSCOPIC ASSISTED VAGINAL HYSTERECTOMY  09/23/2012   Procedure: LAPAROSCOPIC ASSISTED VAGINAL HYSTERECTOMY;  Surgeon: Linda Hedges, DO;  Location: [facility];  Service: Gynecology;  Laterality: N/A;  pull Dr Gregor Hams instrument  . LEFT HEART CATH AND CORONARY ANGIOGRAPHY N/A 02/11/2017   Procedure: Left Heart Cath and Coronary Angiography;  Surgeon: Roderic Palau  Adora Fridge, MD;  Location: [facility];  Service: Cardiovascular;  Laterality: N/A;  . TUBAL LIGATION Bilateral 1992     OB History    Gravida  2   Para  2   Term      Preterm      AB      Living        SAB      TAB      Ectopic      Multiple      Live Births               Home Medications    Prior to Admission medications   Medication Sig Start Date End Date Taking? Authorizing Provider  aspirin 81 MG chewable tablet Chew 1 tablet (81 mg total) by  mouth daily. 02/12/17  Yes Jani Gravel, MD  atorvastatin (LIPITOR) 80 MG tablet Take 1 tablet (80 mg total) by mouth daily at 6 PM. 03/13/18  Yes Bedsole, Amy E, MD  ezetimibe (ZETIA) 10 MG tablet Take 1 tablet (10 mg total) by mouth daily. 09/11/16  Yes Bedsole, Amy E, MD  Insulin Glargine (BASAGLAR KWIKPEN) 100 UNIT/ML SOPN Inject 0.22 mLs (22 Units total) into the skin at bedtime. 02/04/18  Yes Darrick Meigs, Marge Duncans, MD  insulin lispro (HUMALOG KWIKPEN) 100 UNIT/ML KiwkPen Inject into skin 3 times a day at meal time according to the following sliding scale: CBG 70-120 - 0 units / CBG 121-150 - 1 unit / CBG 151-200 - 2 units / CBG 201-250 - 3 units / CBG 251-300 - 5 units / CBG 301-350 - 7 units / CBG 351-400+ - 9 units  YOU MUST FOLLOW A STRICT DIABETIC DIET 02/04/18  Yes Darrick Meigs, Marge Duncans, MD  levothyroxine (SYNTHROID, LEVOTHROID) 150 MCG tablet TAKE 1 TABLET BY MOUTH EVERY DAY 05/08/18  Yes Bedsole, Amy E, MD  losartan (COZAAR) 25 MG tablet Take 25 mg by mouth daily. 05/27/17  Yes [provider]  metoCLOPramide (REGLAN) 5 MG tablet TAKE 1 TABLET BY MOUTH 4 TIMES A DAY BEFORE MEALS AND AT BEDTIME 05/09/18  Yes Bedsole, Amy E, MD  metoprolol succinate (TOPROL-XL) 25 MG 24 hr tablet Take 25 mg by mouth daily. 03/24/17  Yes [provider]  nitroGLYCERIN (NITRODUR - DOSED IN MG/24 HR) 0.2 mg/hr patch Place 1 patch (0.2 mg total) onto the skin daily as needed. Patient taking differently: Place 0.2 mg onto the skin daily.  12/14/17  Yes Regalado, Belkys A, MD  pantoprazole (PROTONIX) 40 MG tablet Take 1 tablet (40 mg total) by mouth daily at 12 noon. 01/28/18  Yes Bedsole, Amy E, MD  RELPAX 40 MG tablet ONE TABLET BY MOUTH AT ONSET OF HEADACHE. MAY REPEAT IN 2 HOURS IF HEADACHE PERSISTS OR RECURS. 10/17/16  Yes Bedsole, Amy E, MD  sertraline (ZOLOFT) 50 MG tablet Take 1 tablet (50 mg total) by mouth daily. 12/16/17  Yes Bedsole, Amy E, MD  Vitamin D, Ergocalciferol, (DRISDOL) 50000 units CAPS capsule TAKE  1 CAPSULE BY MOUTH EVERY 7 DAYS 04/22/18  Yes Bedsole, Amy E, MD  Insulin Pen Needle (PEN NEEDLES) 31G X 8 MM MISC Use to inject insulin 4 times a day.  Dx: E11.10 12/23/17   Jinny Sanders, MD  Lancets (ONETOUCH ULTRASOFT) lancets Use to check blood sugar daily as needed.  Dx: E11.10 12/23/17   Bedsole, Amy E, MD  ondansetron (ZOFRAN) 4 MG tablet TAKE 1 TABLET BY MOUTH EVERY 6 HOURS AS NEEDED FOR  NAUSEA AND VOMITING Patient not taking: Reported on 05/26/2018 04/15/18   Jinny Sanders, MD  promethazine (PHENERGAN) 25 MG suppository Place 1 suppository (25 mg total) rectally every 6 (six) hours as needed for nausea or vomiting. Patient not taking: Reported on 05/26/2018 12/26/17   Macarthur Critchley, MD    Family History Family History  Problem Relation Age of Onset  . Diabetes Other   . Heart disease Other   . Heart failure Mother 38       Her heart skipped a beat and stopped - per pt  . Diabetes Maternal Grandmother   . Alzheimer's disease Maternal Grandmother   . Coronary artery disease Maternal Grandmother   . Heart attack Maternal Grandfather   . Heart failure Brother     Social History Social History   Tobacco Use  . Smoking status: Former Smoker    Years: 0.20    Types: Cigarettes    Last attempt to quit: 12/10/1990    Years since quitting: 27.4  . Smokeless tobacco: Never Used  Substance Use Topics  . Alcohol use: No    Alcohol/week: 0.0 oz  . Drug use: Yes    Frequency: 3.0 times per week    Types: Marijuana     Allergies   Codeine   Review of Systems Review of Systems  Unable to perform ROS: Acuity of condition  Cardiovascular: Positive for chest pain.    Physical Exam Updated Vital Signs BP 102/79   Pulse (!) 115   Temp 97.8 F (36.6 C) (Oral)   Resp (!) 22   Ht 4\' 11"  (1.499 m)   Wt 61.2 kg (135 lb)   LMP 08/11/2012   SpO2 95%   BMI 27.27 kg/m   Physical Exam  Constitutional: She is oriented to person, place, and time. She appears well-developed  and well-nourished. No distress.  Appears uncomfortable. Nontoxic.  HENT:  Head: Normocephalic and atraumatic.  Dry mm  Eyes: Conjunctivae and EOM are normal. No scleral icterus.  Neck: Normal range of motion.  Cardiovascular: Regular rhythm and intact distal pulses.  Tachycardia   Pulmonary/Chest: Effort normal. No stridor. No respiratory distress. She has no wheezes. She has no rales.  Respirations even and unlabored. Lungs CTAB.  Abdominal: Soft. She exhibits no distension.  Genitourinary:  Genitourinary Comments: Normal rectal tone. No melena or hematochezia on DRE.  Musculoskeletal: Normal range of motion.  No BLE pitting edema.  Neurological: She is alert and oriented to person, place, and time. She exhibits normal muscle tone. Coordination normal.  GCS 15. Speech is goal oriented. Moving all extremities spontaneously.  Skin: Skin is warm and dry. No rash noted. She is not diaphoretic. No erythema. No pallor.  Psychiatric: She has a normal mood and affect. Her behavior is normal.  Nursing note and vitals reviewed.    ED Treatments / Results  Labs (all labs ordered are listed, but only abnormal results are displayed) Labs Reviewed  CBC WITH DIFFERENTIAL/PLATELET - Abnormal; Notable for the following components:      Result Value   WBC 21.3 (*)    RBC 6.15 (*)    Hemoglobin 17.2 (*)    HCT 51.8 (*)    Platelets 435 (*)    Neutro Abs 17.9 (*)    Monocytes Absolute 1.3 (*)    Abs Immature Granulocytes 0.2 (*)    All other components within normal limits  COMPREHENSIVE METABOLIC PANEL - Abnormal; Notable for the following components:   Sodium 129 (*)  Potassium 3.0 (*)    Chloride 86 (*)    CO2 12 (*)    Glucose, Bld 701 (*)    Creatinine, Ser 1.36 (*)    Alkaline Phosphatase 127 (*)    Total Bilirubin 2.0 (*)    GFR calc non Af Amer 43 (*)    GFR calc Af Amer 50 (*)    Anion gap 31 (*)    All other components within normal limits  CBG MONITORING, ED - Abnormal;  Notable for the following components:   Glucose-Capillary >600 (*)    All other components within normal limits  I-STAT CHEM 8, ED - Abnormal; Notable for the following components:   Sodium 128 (*)    Chloride 95 (*)    Glucose, Bld >700 (*)    Calcium, Ion 0.96 (*)    TCO2 14 (*)    Hemoglobin 18.4 (*)    HCT 54.0 (*)    All other components within normal limits  I-STAT TROPONIN, ED - Abnormal; Notable for the following components:   Troponin i, poc 0.41 (*)    All other components within normal limits  I-STAT ARTERIAL BLOOD GAS, ED - Abnormal; Notable for the following components:   pCO2 arterial 22.6 (*)    Bicarbonate 13.1 (*)    TCO2 14 (*)    Acid-base deficit 10.0 (*)    All other components within normal limits  CBG MONITORING, ED - Abnormal; Notable for the following components:   Glucose-Capillary >600 (*)    All other components within normal limits  URINALYSIS, ROUTINE W REFLEX MICROSCOPIC  POC OCCULT BLOOD, ED    EKG EKG Interpretation  Date/Time:  Sunday May 25 2018 23:31:12 EDT Ventricular Rate:  122 PR Interval:    QRS Duration: 75 QT Interval:  343 QTC Calculation: 489 R Axis:   96 Text Interpretation:  Sinus tachycardia Borderline right axis deviation Consider left ventricular hypertrophy Anterior Q waves, possibly due to LVH Abnrm T, consider ischemia, anterolateral lds Confirmed by Orpah Greek [phone]) on 05/25/2018 11:39:22 PM   Radiology Dg Chest 2 View  Result Date: 05/26/2018 CLINICAL DATA:  Central chest pain, feeling unwell. Coffee-ground emesis. History of hypertension, diabetes. EXAM: CHEST - 2 VIEW COMPARISON:  Chest radiograph February 26, 2018 FINDINGS: Cardiomediastinal silhouette is normal. No pleural effusions or focal consolidations. Trachea projects midline and there is no pneumothorax. Soft tissue planes and included osseous structures are non-suspicious. Multiple EKG lines overlie the patient and may obscure subtle underlying  pathology. IMPRESSION: Negative. Electronically Signed   By: Elon Alas M.D.   On: 05/26/2018 01:33    Procedures Procedures (including critical care time)  Medications Ordered in ED Medications  insulin regular (NOVOLIN R,HUMULIN R) 100 Units in sodium chloride 0.9 % 100 mL (1 Units/mL) infusion (5.4 Units/hr Intravenous New Bag/Given 05/26/18 0108)  dextrose 5 %-0.45 % sodium chloride infusion (has no administration in time range)  potassium chloride 10 mEq in 100 mL IVPB (0 mEq Intravenous Stopped 05/26/18 0116)  pantoprazole (PROTONIX) injection 40 mg (has no administration in time range)  metoCLOPramide (REGLAN) injection 10 mg (has no administration in time range)  fentaNYL (SUBLIMAZE) injection 50 mcg (50 mcg Intravenous Given 05/26/18 0003)  ondansetron (ZOFRAN) injection 4 mg (4 mg Intravenous Given 05/26/18 0004)  sodium chloride 0.9 % bolus 500 mL (0 mLs Intravenous Stopped 05/26/18 0101)    12:00 AM Chem8 with CBG >700. Anion gap 19 with CO2 of 14. Potassium 4.5. Will order insulin gtt  per DKA order set.   CRITICAL CARE Performed by: Antonietta Breach   Total critical care time: 45 minutes  Critical care time was exclusive of separately billable procedures and treating other patients.  Critical care was necessary to treat or prevent imminent or life-threatening deterioration.  Critical care was time spent personally by me on the following activities: development of treatment plan with patient and/or surrogate as well as nursing, discussions with consultants, evaluation of patient's response to treatment, examination of patient, obtaining history from patient or surrogate, ordering and performing treatments and interventions, ordering and review of laboratory studies, ordering and review of radiographic studies, pulse oximetry and re-evaluation of patient's condition.   Initial Impression / Assessment and Plan / ED Course  I have reviewed the triage vital signs and the  nursing notes.  Pertinent labs & imaging results that were available during my care of the patient were reviewed by me and considered in my medical decision making (see chart for details).     57 year old female presents to the emergency department for evaluation of nausea and vomiting.  She reports emesis to be coffee-ground in nature.  The patient has not had any emesis visualized while in the ED.  She is Hemoccult negative.  Hemoglobin stable.  Low suspicion for UGI bleed at this time.   She has been noncompliant with her insulin over the past 3 days.  Patient found to be hyperglycemic on arrival with elevated anion gap.  Clinical picture consistent with DKA.  This is likely causing elevated troponin level; felt less likely to represent ACS as patient has a history of normal cardiac catheterization 1 year ago with clean coronaries.  Patient managed with IV insulin.  She was also given IV potassium for hypokalemia.  We will continue to hydrate with IV fluids given AKI.  Case discussed with Dr. Hal Hope who will admit; anticipate admission to SDU.   Final Clinical Impressions(s) / ED Diagnoses   Final diagnoses:  Diabetic ketoacidosis without coma associated with type 2 diabetes mellitus ([place])  Chronic chest pain  Elevated troponin  AKI (acute kidney injury) [facility])  Hypokalemia    ED Discharge Orders    None       Antonietta Breach, PA-C 05/26/18 0131    Antonietta Breach, PA-C 05/26/18 0143    Orpah Greek, MD 05/26/18 [phone]

## 2018-05-25 NOTE — ED Triage Notes (Signed)
Per EMS at home at 0400 pt awaken with CP, N, SOB, pt still did not feel well, center chest pain radiates to back, nitro patch was worn earlier today, 324 mg aspirin taken at home, pt vomited multiple times, coffee ground emesis

## 2018-05-26 ENCOUNTER — Emergency Department (HOSPITAL_COMMUNITY): Payer: 59

## 2018-05-26 ENCOUNTER — Encounter (HOSPITAL_COMMUNITY): Payer: Self-pay | Admitting: Internal Medicine

## 2018-05-26 ENCOUNTER — Other Ambulatory Visit: Payer: Self-pay

## 2018-05-26 DIAGNOSIS — G8929 Other chronic pain: Secondary | ICD-10-CM | POA: Diagnosis present

## 2018-05-26 DIAGNOSIS — D72829 Elevated white blood cell count, unspecified: Secondary | ICD-10-CM | POA: Diagnosis not present

## 2018-05-26 DIAGNOSIS — N179 Acute kidney failure, unspecified: Secondary | ICD-10-CM | POA: Diagnosis present

## 2018-05-26 DIAGNOSIS — Z79899 Other long term (current) drug therapy: Secondary | ICD-10-CM | POA: Diagnosis not present

## 2018-05-26 DIAGNOSIS — Z87891 Personal history of nicotine dependence: Secondary | ICD-10-CM | POA: Diagnosis not present

## 2018-05-26 DIAGNOSIS — K219 Gastro-esophageal reflux disease without esophagitis: Secondary | ICD-10-CM | POA: Diagnosis present

## 2018-05-26 DIAGNOSIS — K3184 Gastroparesis: Secondary | ICD-10-CM | POA: Diagnosis present

## 2018-05-26 DIAGNOSIS — M810 Age-related osteoporosis without current pathological fracture: Secondary | ICD-10-CM | POA: Diagnosis present

## 2018-05-26 DIAGNOSIS — E1143 Type 2 diabetes mellitus with diabetic autonomic (poly)neuropathy: Secondary | ICD-10-CM | POA: Diagnosis present

## 2018-05-26 DIAGNOSIS — R079 Chest pain, unspecified: Secondary | ICD-10-CM

## 2018-05-26 DIAGNOSIS — E1110 Type 2 diabetes mellitus with ketoacidosis without coma: Secondary | ICD-10-CM | POA: Diagnosis present

## 2018-05-26 DIAGNOSIS — Z9119 Patient's noncompliance with other medical treatment and regimen: Secondary | ICD-10-CM | POA: Diagnosis not present

## 2018-05-26 DIAGNOSIS — N182 Chronic kidney disease, stage 2 (mild): Secondary | ICD-10-CM | POA: Diagnosis present

## 2018-05-26 DIAGNOSIS — I110 Hypertensive heart disease with heart failure: Secondary | ICD-10-CM | POA: Diagnosis not present

## 2018-05-26 DIAGNOSIS — E039 Hypothyroidism, unspecified: Secondary | ICD-10-CM | POA: Diagnosis present

## 2018-05-26 DIAGNOSIS — E876 Hypokalemia: Secondary | ICD-10-CM | POA: Diagnosis present

## 2018-05-26 DIAGNOSIS — E1122 Type 2 diabetes mellitus with diabetic chronic kidney disease: Secondary | ICD-10-CM | POA: Diagnosis present

## 2018-05-26 DIAGNOSIS — Z7982 Long term (current) use of aspirin: Secondary | ICD-10-CM | POA: Diagnosis not present

## 2018-05-26 DIAGNOSIS — I130 Hypertensive heart and chronic kidney disease with heart failure and stage 1 through stage 4 chronic kidney disease, or unspecified chronic kidney disease: Secondary | ICD-10-CM | POA: Diagnosis present

## 2018-05-26 DIAGNOSIS — I10 Essential (primary) hypertension: Secondary | ICD-10-CM

## 2018-05-26 DIAGNOSIS — Z794 Long term (current) use of insulin: Secondary | ICD-10-CM | POA: Diagnosis not present

## 2018-05-26 DIAGNOSIS — I5022 Chronic systolic (congestive) heart failure: Secondary | ICD-10-CM | POA: Diagnosis present

## 2018-05-26 DIAGNOSIS — E1140 Type 2 diabetes mellitus with diabetic neuropathy, unspecified: Secondary | ICD-10-CM | POA: Diagnosis not present

## 2018-05-26 DIAGNOSIS — R748 Abnormal levels of other serum enzymes: Secondary | ICD-10-CM

## 2018-05-26 DIAGNOSIS — R0789 Other chest pain: Secondary | ICD-10-CM | POA: Diagnosis present

## 2018-05-26 DIAGNOSIS — E860 Dehydration: Secondary | ICD-10-CM | POA: Diagnosis present

## 2018-05-26 DIAGNOSIS — E0810 Diabetes mellitus due to underlying condition with ketoacidosis without coma: Secondary | ICD-10-CM

## 2018-05-26 DIAGNOSIS — E1142 Type 2 diabetes mellitus with diabetic polyneuropathy: Secondary | ICD-10-CM | POA: Diagnosis present

## 2018-05-26 DIAGNOSIS — E1129 Type 2 diabetes mellitus with other diabetic kidney complication: Secondary | ICD-10-CM | POA: Diagnosis not present

## 2018-05-26 LAB — URINALYSIS, ROUTINE W REFLEX MICROSCOPIC
BACTERIA UA: NONE SEEN
Bilirubin Urine: NEGATIVE
Ketones, ur: 80 mg/dL — AB
Leukocytes, UA: NEGATIVE
Nitrite: NEGATIVE
PH: 5 (ref 5.0–8.0)
Protein, ur: 100 mg/dL — AB
Specific Gravity, Urine: 1.027 (ref 1.005–1.030)

## 2018-05-26 LAB — BASIC METABOLIC PANEL
Anion gap: 14 (ref 5–15)
Anion gap: 9 (ref 5–15)
Anion gap: 12 (ref 5–15)
BUN: 13 mg/dL (ref 6–20)
BUN: 12 mg/dL (ref 6–20)
BUN: 12 mg/dL (ref 6–20)
CHLORIDE: 102 mmol/L (ref 101–111)
CHLORIDE: 107 mmol/L (ref 101–111)
CHLORIDE: 106 mmol/L (ref 101–111)
CO2: 19 mmol/L — AB (ref 22–32)
CO2: 19 mmol/L — AB (ref 22–32)
CO2: 19 mmol/L — AB (ref 22–32)
Calcium: 8.6 mg/dL — ABNORMAL LOW (ref 8.9–10.3)
Calcium: 8.5 mg/dL — ABNORMAL LOW (ref 8.9–10.3)
Calcium: 8.7 mg/dL — ABNORMAL LOW (ref 8.9–10.3)
Creatinine, Ser: 1 mg/dL (ref 0.44–1.00)
Creatinine, Ser: 0.74 mg/dL (ref 0.44–1.00)
Creatinine, Ser: 0.67 mg/dL (ref 0.44–1.00)
GFR calc Af Amer: >60 mL/min (ref >60)
GFR calc Af Amer: >60 mL/min (ref >60)
GFR calc Af Amer: >60 mL/min (ref >60)
GFR calc non Af Amer: >60 mL/min (ref >60)
GFR calc non Af Amer: >60 mL/min (ref >60)
GFR calc non Af Amer: >60 mL/min (ref >60)
GLUCOSE: 353 mg/dL — AB (ref 65–99)
Glucose, Bld: 179 mg/dL — ABNORMAL HIGH (ref 65–99)
Glucose, Bld: 138 mg/dL — ABNORMAL HIGH (ref 65–99)
POTASSIUM: 3.3 mmol/L — AB (ref 3.5–5.1)
POTASSIUM: 3.4 mmol/L — AB (ref 3.5–5.1)
POTASSIUM: 3.6 mmol/L (ref 3.5–5.1)
SODIUM: 135 mmol/L (ref 135–145)
SODIUM: 135 mmol/L (ref 135–145)
SODIUM: 137 mmol/L (ref 135–145)

## 2018-05-26 LAB — CBC WITH DIFFERENTIAL/PLATELET
Abs Immature Granulocytes: 0.2 10*3/uL — ABNORMAL HIGH (ref 0.0–0.1)
BASOS ABS: 0.1 10*3/uL (ref 0.0–0.1)
Basophils Relative: 0 %
EOS ABS: 0 10*3/uL (ref 0.0–0.7)
EOS PCT: 0 %
HCT: 51.8 % — ABNORMAL HIGH (ref 36.0–46.0)
Hemoglobin: 17.2 g/dL — ABNORMAL HIGH (ref 12.0–15.0)
Immature Granulocytes: 1 %
Lymphocytes Relative: 9 %
Lymphs Abs: 1.9 10*3/uL (ref 0.7–4.0)
MCH: 28 pg (ref 26.0–34.0)
MCHC: 33.2 g/dL (ref 30.0–36.0)
MCV: 84.2 fL (ref 78.0–100.0)
MONO ABS: 1.3 10*3/uL — AB (ref 0.1–1.0)
Monocytes Relative: 6 %
Neutro Abs: 17.9 10*3/uL — ABNORMAL HIGH (ref 1.7–7.7)
Neutrophils Relative %: 84 %
Platelets: 435 10*3/uL — ABNORMAL HIGH (ref 150–400)
RBC: 6.15 MIL/uL — ABNORMAL HIGH (ref 3.87–5.11)
RDW: 14.2 % (ref 11.5–15.5)
WBC: 21.3 10*3/uL — ABNORMAL HIGH (ref 4.0–10.5)

## 2018-05-26 LAB — I-STAT ARTERIAL BLOOD GAS, ED
ACID-BASE DEFICIT: 10 mmol/L — AB (ref 0.0–2.0)
Bicarbonate: 13.1 mmol/L — ABNORMAL LOW (ref 20.0–28.0)
O2 Saturation: 98 %
TCO2: 14 mmol/L — AB (ref 22–32)
pCO2 arterial: 22.6 mmHg — ABNORMAL LOW (ref 32.0–48.0)
pH, Arterial: 7.37 (ref 7.350–7.450)
pO2, Arterial: 100 mmHg (ref 83.0–108.0)

## 2018-05-26 LAB — CBG MONITORING, ED
GLUCOSE-CAPILLARY: 508 mg/dL — AB (ref 65–99)
GLUCOSE-CAPILLARY: 434 mg/dL — AB (ref 65–99)
GLUCOSE-CAPILLARY: 398 mg/dL — AB (ref 65–99)
GLUCOSE-CAPILLARY: 227 mg/dL — AB (ref 65–99)
GLUCOSE-CAPILLARY: 167 mg/dL — AB (ref 65–99)
GLUCOSE-CAPILLARY: 131 mg/dL — AB (ref 65–99)
Glucose-Capillary: 176 mg/dL — ABNORMAL HIGH (ref 65–99)
Glucose-Capillary: 189 mg/dL — ABNORMAL HIGH (ref 65–99)
Glucose-Capillary: 223 mg/dL — ABNORMAL HIGH (ref 65–99)
Glucose-Capillary: 254 mg/dL — ABNORMAL HIGH (ref 65–99)
Glucose-Capillary: >600 mg/dL (ref 65–99)
Glucose-Capillary: >600 mg/dL (ref 65–99)

## 2018-05-26 LAB — COMPREHENSIVE METABOLIC PANEL
ALT: 16 U/L (ref 14–54)
ANION GAP: 31 — AB (ref 5–15)
AST: 19 U/L (ref 15–41)
Albumin: 4.2 g/dL (ref 3.5–5.0)
Alkaline Phosphatase: 127 U/L — ABNORMAL HIGH (ref 38–126)
BUN: 14 mg/dL (ref 6–20)
CALCIUM: 9.4 mg/dL (ref 8.9–10.3)
CHLORIDE: 86 mmol/L — AB (ref 101–111)
CO2: 12 mmol/L — AB (ref 22–32)
Creatinine, Ser: 1.36 mg/dL — ABNORMAL HIGH (ref 0.44–1.00)
GFR, EST AFRICAN AMERICAN: 50 mL/min — AB (ref >60)
GFR, EST NON AFRICAN AMERICAN: 43 mL/min — AB (ref >60)
Glucose, Bld: 701 mg/dL (ref 65–99)
Potassium: 3 mmol/L — ABNORMAL LOW (ref 3.5–5.1)
SODIUM: 129 mmol/L — AB (ref 135–145)
Total Bilirubin: 2 mg/dL — ABNORMAL HIGH (ref 0.3–1.2)
Total Protein: 7.8 g/dL (ref 6.5–8.1)

## 2018-05-26 LAB — I-STAT TROPONIN, ED: Troponin i, poc: 0.41 ng/mL (ref 0.00–0.08)

## 2018-05-26 LAB — TROPONIN I
Troponin I: 0.4 ng/mL (ref <0.03)
Troponin I: 0.34 ng/mL (ref <0.03)
Troponin I: 0.29 ng/mL (ref <0.03)

## 2018-05-26 LAB — CBC
HEMATOCRIT: 47.8 % — AB (ref 36.0–46.0)
Hemoglobin: 16.3 g/dL — ABNORMAL HIGH (ref 12.0–15.0)
MCH: 28.5 pg (ref 26.0–34.0)
MCHC: 34.1 g/dL (ref 30.0–36.0)
MCV: 83.7 fL (ref 78.0–100.0)
Platelets: 364 10*3/uL (ref 150–400)
RBC: 5.71 MIL/uL — ABNORMAL HIGH (ref 3.87–5.11)
RDW: 14.4 % (ref 11.5–15.5)
WBC: 23.8 10*3/uL — AB (ref 4.0–10.5)

## 2018-05-26 LAB — POC OCCULT BLOOD, ED: FECAL OCCULT BLD: NEGATIVE

## 2018-05-26 LAB — GLUCOSE, CAPILLARY
Glucose-Capillary: 250 mg/dL — ABNORMAL HIGH (ref 65–99)
Glucose-Capillary: 180 mg/dL — ABNORMAL HIGH (ref 65–99)

## 2018-05-26 MED ORDER — PANTOPRAZOLE SODIUM 40 MG PO TBEC
40.0000 mg | DELAYED_RELEASE_TABLET | Freq: Every day | ORAL | Status: DC
  Administered 2018-05-26 – 2018-05-27 (×2): 40 mg via ORAL
  Filled 2018-05-26: qty 1

## 2018-05-26 MED ORDER — EZETIMIBE 10 MG PO TABS
10.0000 mg | ORAL_TABLET | Freq: Every day | ORAL | Status: DC
  Administered 2018-05-26 – 2018-05-28 (×3): 10 mg via ORAL
  Filled 2018-05-26 (×3): qty 1

## 2018-05-26 MED ORDER — POTASSIUM CHLORIDE 10 MEQ/100ML IV SOLN
10.0000 meq | INTRAVENOUS | Status: AC
  Administered 2018-05-26 (×2): 10 meq via INTRAVENOUS
  Filled 2018-05-26 (×2): qty 100

## 2018-05-26 MED ORDER — ASPIRIN EC 81 MG PO TBEC
81.0000 mg | DELAYED_RELEASE_TABLET | Freq: Every day | ORAL | Status: DC
  Administered 2018-05-26 – 2018-05-28 (×3): 81 mg via ORAL
  Filled 2018-05-26 (×3): qty 1

## 2018-05-26 MED ORDER — PROMETHAZINE HCL 25 MG/ML IJ SOLN
12.5000 mg | Freq: Four times a day (QID) | INTRAMUSCULAR | Status: DC | PRN
  Administered 2018-05-27 (×2): 12.5 mg via INTRAVENOUS
  Filled 2018-05-26 (×2): qty 1

## 2018-05-26 MED ORDER — SODIUM CHLORIDE 0.9 % IV SOLN
INTRAVENOUS | Status: DC
  Administered 2018-05-26: 04:00:00 via INTRAVENOUS

## 2018-05-26 MED ORDER — LEVOTHYROXINE SODIUM 150 MCG PO TABS: 150.0000 ug | ORAL_TABLET | Freq: Every day | ORAL | Status: DC

## 2018-05-26 MED ORDER — ONDANSETRON HCL 4 MG/2ML IJ SOLN
4.0000 mg | Freq: Four times a day (QID) | INTRAMUSCULAR | Status: DC | PRN
  Administered 2018-05-26 – 2018-05-27 (×4): 4 mg via INTRAVENOUS
  Filled 2018-05-26 (×5): qty 2

## 2018-05-26 MED ORDER — POTASSIUM CHLORIDE 10 MEQ/100ML IV SOLN
10.0000 meq | INTRAVENOUS | Status: DC
  Administered 2018-05-26 (×3): 10 meq via INTRAVENOUS
  Filled 2018-05-26 (×3): qty 100

## 2018-05-26 MED ORDER — ENOXAPARIN SODIUM 40 MG/0.4ML ~~LOC~~ SOLN
40.0000 mg | SUBCUTANEOUS | Status: DC
  Administered 2018-05-26 – 2018-05-27 (×2): 40 mg via SUBCUTANEOUS
  Filled 2018-05-26 (×3): qty 0.4

## 2018-05-26 MED ORDER — ASPIRIN 81 MG PO CHEW: 81.0000 mg | CHEWABLE_TABLET | Freq: Every day | ORAL | Status: DC

## 2018-05-26 MED ORDER — ATORVASTATIN CALCIUM 80 MG PO TABS
80.0000 mg | ORAL_TABLET | Freq: Every day | ORAL | Status: DC
  Administered 2018-05-26 – 2018-05-27 (×2): 80 mg via ORAL
  Filled 2018-05-26 (×3): qty 1

## 2018-05-26 MED ORDER — METOCLOPRAMIDE HCL 5 MG/ML IJ SOLN
10.0000 mg | Freq: Once | INTRAMUSCULAR | Status: AC
  Administered 2018-05-26: 10 mg via INTRAVENOUS
  Filled 2018-05-26: qty 2

## 2018-05-26 MED ORDER — DEXTROSE-NACL 5-0.45 % IV SOLN: INTRAVENOUS | Status: DC

## 2018-05-26 MED ORDER — METOPROLOL SUCCINATE ER 25 MG PO TB24
25.0000 mg | ORAL_TABLET | Freq: Every day | ORAL | Status: DC
  Administered 2018-05-26 – 2018-05-28 (×2): 25 mg via ORAL
  Filled 2018-05-26 (×3): qty 1

## 2018-05-26 MED ORDER — SODIUM CHLORIDE 0.9 % IV SOLN
INTRAVENOUS | Status: DC
  Administered 2018-05-26: 5.4 [IU]/h via INTRAVENOUS
  Filled 2018-05-26: qty 1

## 2018-05-26 MED ORDER — PANTOPRAZOLE SODIUM 40 MG IV SOLR
40.0000 mg | Freq: Once | INTRAVENOUS | Status: AC
  Administered 2018-05-26: 40 mg via INTRAVENOUS
  Filled 2018-05-26: qty 40

## 2018-05-26 MED ORDER — INSULIN ASPART 100 UNIT/ML ~~LOC~~ SOLN
0.0000 [IU] | Freq: Three times a day (TID) | SUBCUTANEOUS | Status: DC
  Administered 2018-05-26: 5 [IU] via SUBCUTANEOUS
  Administered 2018-05-27: 3 [IU] via SUBCUTANEOUS
  Administered 2018-05-27: 2 [IU] via SUBCUTANEOUS
  Administered 2018-05-27: 8 [IU] via SUBCUTANEOUS
  Administered 2018-05-28: 3 [IU] via SUBCUTANEOUS

## 2018-05-26 MED ORDER — SERTRALINE HCL 50 MG PO TABS
50.0000 mg | ORAL_TABLET | Freq: Every day | ORAL | Status: DC
  Administered 2018-05-26 – 2018-05-28 (×3): 50 mg via ORAL
  Filled 2018-05-26 (×3): qty 1

## 2018-05-26 MED ORDER — SODIUM CHLORIDE 0.9 % IV SOLN
INTRAVENOUS | Status: DC
Start: 2018-05-26 — End: 2018-05-27
  Administered 2018-05-26 – 2018-05-27 (×4): via INTRAVENOUS

## 2018-05-26 MED ORDER — BUTALBITAL-APAP-CAFFEINE 50-325-40 MG PO TABS
1.0000 | ORAL_TABLET | Freq: Four times a day (QID) | ORAL | Status: DC | PRN
  Administered 2018-05-26: 2 via ORAL
  Filled 2018-05-26: qty 2

## 2018-05-26 MED ORDER — LEVOTHYROXINE SODIUM 75 MCG PO TABS
150.0000 ug | ORAL_TABLET | Freq: Every day | ORAL | Status: DC
  Administered 2018-05-27 – 2018-05-28 (×2): 150 ug via ORAL
  Filled 2018-05-26 (×2): qty 2

## 2018-05-26 MED ORDER — DEXTROSE-NACL 5-0.45 % IV SOLN
INTRAVENOUS | Status: DC
  Administered 2018-05-26: 08:00:00 via INTRAVENOUS

## 2018-05-26 MED ORDER — INSULIN ASPART 100 UNIT/ML ~~LOC~~ SOLN: 0.0000 [IU] | Freq: Every day | SUBCUTANEOUS | Status: DC

## 2018-05-26 MED ORDER — SODIUM CHLORIDE 0.9 % IV SOLN
INTRAVENOUS | Status: DC
  Filled 2018-05-26: qty 1

## 2018-05-26 MED ORDER — LEVOTHYROXINE SODIUM 100 MCG IV SOLR
75.0000 ug | Freq: Every day | INTRAVENOUS | Status: DC
  Administered 2018-05-26: 75 ug via INTRAVENOUS
  Filled 2018-05-26: qty 5

## 2018-05-26 MED ORDER — INSULIN GLARGINE 100 UNIT/ML ~~LOC~~ SOLN
22.0000 [IU] | Freq: Every day | SUBCUTANEOUS | Status: DC
  Administered 2018-05-26 – 2018-05-28 (×3): 22 [IU] via SUBCUTANEOUS
  Filled 2018-05-26 (×3): qty 0.22

## 2018-05-26 MED ORDER — POTASSIUM CHLORIDE CRYS ER 20 MEQ PO TBCR
40.0000 meq | EXTENDED_RELEASE_TABLET | Freq: Once | ORAL | Status: DC
  Filled 2018-05-26: qty 2

## 2018-05-26 NOTE — ED Notes (Signed)
PA Humes informed of troponin results .[place]

## 2018-05-26 NOTE — ED Notes (Signed)
Insulin drip rate changed to 5.8 matt rn verifying

## 2018-05-26 NOTE — ED Notes (Signed)
Elevated tro called to admitting doctor

## 2018-05-26 NOTE — ED Notes (Signed)
Pt CBG was greater than 600, notified Nikki(RN)

## 2018-05-26 NOTE — Progress Notes (Addendum)
[name] [name] - Stepdown/ICU TEAM  [name] [name]  IRJ:[id number] DOB: Aug 22, 1961 DOA: 05/25/2018 PCP: Jinny Sanders, MD    Brief Narrative:  57 y.o. female w/ a hx of DM2, hypertension, and hypothyroidism who presents to the ED w/ elevated blood sugars, nausea/vomiting, and chest pain which is chronic.  In the ER her blood sugar was ~700 with anion gap of 31.    Subjective: Pt is seen for a f/u visit.  She reports that her GI symptoms have essentially resolved.  Assessment & Plan:  Uncontrolled DM 2 with DKA  Nausea/vomiting with diarrhea  Acute kidney injury  HTN  Chronic chest pain - mildly elevated troponin  Chronic systolic CHF  Hypothyroidism  Hypokalemia  DVT prophylaxis: Lovenox Code Status: FULL CODE Family Communication: no family present at time of exam  Disposition Plan:   Consultants:  none  Procedures: none  Antimicrobials:  none  Objective: Blood pressure (!) 83/66, pulse (!) 102, temperature 98.[name] F (36.7 C), temperature source Oral, resp. rate 14, height 4\' 11"  ([name].499 m), weight 61.6 kg (135 lb 12.9 oz), last menstrual period 08/11/2012, SpO2 95 %.  Intake/Output Summary (Last 24 hours) at 05/26/2018 1609 Last data filed at 05/26/2018 1310 Gross per 24 hour  Intake 2401.39 ml  Output -  Net 2401.39 ml   Filed Weights   05/25/18 2334 05/26/18 1403  Weight: 61.2 kg (135 lb) 61.6 kg (135 lb 12.9 oz)    Examination: Pt was seen for a f/u visit.    CBC: Recent Labs  Lab 05/25/18 2357 05/26/18 0003 05/26/18 0508  WBC  --  21.3* 23.8*  NEUTROABS  --  17.9*  --   HGB 18.4* 17.2* 16.3*  HCT 54.0* 51.8* 47.8*  MCV  --  84.2 83.7  PLT  --  435* 301   Basic Metabolic Panel: Recent Labs  Lab 05/25/18 2357 05/26/18 0003 05/26/18 0508 05/26/18 0921 05/26/18 1355  NA 128* 129* 135 135 137  K 4.5 3.0* 3.3* 3.4* 3.6  CL 95* 86* 102 107 106  CO2  --  12* 19* 19* 19*  GLUCOSE >700* 701* 353* 179* 138*  BUN 17 14 13 12 12     CREATININE 0.60 [name].36* [name].00 0.74 0.67  CALCIUM  --  9.4 8.6* 8.5* 8.7*   GFR: Estimated Creatinine Clearance: 64.2 mL/min (by C-G formula based on SCr of 0.67 mg/dL).  Liver Function Tests: Recent Labs  Lab 05/26/18 0003  AST 19  ALT 16  ALKPHOS 127*  BILITOT 2.0*  PROT 7.8  ALBUMIN 4.2    Cardiac Enzymes: Recent Labs  Lab 05/26/18 0508 05/26/18 0921 05/26/18 1355  TROPONINI 0.40* 0.34* 0.29*    HbA1C: Hgb A1c MFr Bld  Date/Time Value Ref Range Status  02/02/2018 12:02 PM 12.7 (H) 4.8 - 5.6 % Final    Comment:    (NOTE)         Prediabetes: 5.7 - 6.4         Diabetes: >6.4         Glycemic control for adults with diabetes: <7.0   12/11/2017 02:16 PM 11.[name] (H) 4.8 - 5.6 % Final    Comment:    (NOTE) Pre diabetes:          5.7%-6.4% Diabetes:              >6.4% Glycemic control for   <7.0% adults with diabetes     CBG: Recent Labs  Lab 05/26/18 0818 05/26/18 0937 05/26/18  1055 05/26/18 1203 05/26/18 1309  GLUCAP 189* 176* 223* 167* 131*   Scheduled Meds: . aspirin EC  81 mg Oral Daily  . atorvastatin  80 mg Oral q1800  . enoxaparin (LOVENOX) injection  40 mg Subcutaneous Q24H  . ezetimibe  10 mg Oral Daily  . insulin aspart  0-15 Units Subcutaneous TID WC  . insulin aspart  0-5 Units Subcutaneous QHS  . insulin glargine  22 Units Subcutaneous Daily  . levothyroxine  75 mcg Intravenous Daily  . metoprolol succinate  25 mg Oral Daily  . pantoprazole  40 mg Oral Q1200  . potassium chloride  40 mEq Oral Once  . sertraline  50 mg Oral Daily     LOS: 0 days   [name] Altes, MD [facility] Office  [phone] Pager - Text Page per Amion as per below:  On-Call/Text Page:      [email or web address]      password [email or web address]  If 7PM-7AM, please contact night-coverage [email or web address] Password [facility] 05/26/2018, 4:09 PM

## 2018-05-26 NOTE — ED Notes (Signed)
Pt's CBG result was 176. Informed Kehinde - RN and Hassan Rowan - RN.

## 2018-05-26 NOTE — Progress Notes (Signed)
Inpatient Diabetes Program Recommendations  AACE/ADA: New Consensus Statement on Inpatient Glycemic Control (2015)  Target Ranges:  Prepandial:   less than 140 mg/dL      Peak postprandial:   less than 180 mg/dL (1-2 hours)      Critically ill patients:  140 - 180 mg/dL    Review of Glycemic Control  Diabetes history: DM 2 Outpatient Diabetes medications: Basaglar 22 units, Humalog 0-9 units tid Current orders for Inpatient glycemic control: IV insulin gtt  Inpatient Diabetes Program Recommendations:    Consider ordering home regimen at time of transition.  Patient gets abd pain N/V and then does not take her insulin.  Patient is familiar with our team. We have spoke with patient on numerous occassions this year on 2/26, 3/29. Patient reports being d/c'd from Dr. Arman Filter service due to no show appointments and has been working with her insurance to find an Musician who will accept her.  Pt saw her PCP on 4/4 who is deferring to an Endo for DM control.   Patient with unchanged A1c levels for months.  Thanks,  Tama Headings RN, MSN, BC-ADM, [facility] Inpatient Diabetes Coordinator Team Pager [phone] (8a-5p)

## 2018-05-26 NOTE — ED Notes (Signed)
PA Humes informed of Chem 8 results

## 2018-05-26 NOTE — Progress Notes (Signed)
[name] [name] [id number]  Code Status: FULL  Admission Data: 05/26/2018 3:16 PM  Attending Provider: Thereasa Solo  EQA:[id number], Mervyn Gay, MD  Consults/ Treatment Team:   [name] is a 58 y.o. female patient admitted from ED awake, alert - oriented X 4 - no acute distress noted. VSS - Blood pressure 129/83, pulse (!) 107, temperature 98.8 F (37.1 C), temperature source Oral, resp. rate 20, height 4\' 11"  (1.499 m), weight 61.6 kg (135 lb 12.9 oz), last menstrual period 08/11/2012, SpO2 96 %. no c/o shortness of breath, no c/o chest pain. Orientation to room, and floor completed with information packet given to patient/family.  Admission INP armband ID verified with patient/family, and in place.  Fall assessment complete, with patient and family able to verbalize understanding of risk associated with falls, and verbalized understanding to call nsg before up out of bed. Call light within reach, patient able to voice, and demonstrate understanding. Skin, clean-dry- intact without evidence of bruising, or skin tears.  No evidence of skin break down noted on exam.  ?  Will cont to eval and treat per MD orders.  Melonie Florida, RN  05/26/2018 3:16 PM

## 2018-05-26 NOTE — ED Provider Notes (Signed)
Patient presented to the ER with nausea and vomiting with generalized weakness  Face to face Exam: HEENT - PERRLA Lungs - CTAB Heart -regular and tachycardic, no M/R/G Abd - S/NT/ND Neuro - alert, oriented x3  Plan: Patient has diabetic ketoacidosis, will require hospitalization for further management.   Orpah Greek, MD 05/26/18 [phone]

## 2018-05-26 NOTE — H&P (Addendum)
History and Physical    [name] [name] DVV:[id number] DOB: 17-Nov-1961 DOA: 05/25/2018  PCP: Jinny Sanders, MD  Patient coming from: Home.  Chief Complaint: Nausea vomiting and elevated blood sugar.  HPI: [name] [name] is a 57 y.o. female with history of diabetes mellitus type 2, hypertension, hypothyroidism presents to the ER because of elevated blood sugar nausea vomiting but also has been having chest pain which is chronic.  Patient states over the last 2 days patient was unable to keep anything because of multiple episodes of vomiting.  Vomitus was at times dark color.  Also had occasional diarrhea.  Denies any abdominal pain.  Chest pain has been chronic and runs across the chest which patient states has been present all the time unit walking are dressed.  Denies any shortness of breath fever chills or productive cough.  Patient states last 2 days she has not taken her insulin due to persistent vomiting.  ED Course: In the ER patient blood sugar was around 700 with anion gap of 31.  Patient was started on fluids bolus and IV insulin infusion for DKA.  EKG shows ST-T changes with mildly elevated troponin.  Abdomen appears benign.  Review of Systems: As per HPI, rest all negative.   Past Medical History:  Diagnosis Date  . Anxiety   . B12 deficiency   . Chest pain 04/05/2017  . GERD (gastroesophageal reflux disease)   . Hyperlipidemia   . Hypertension    [name]  . Hypothyroidism   . IBS (irritable bowel syndrome)   . Intermittent vertigo   . 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 Leg cramping    "@ night" (09/18/2013)  . Migraine    "once q couple months" (09/18/2013)  . Osteoporosis   . Peripheral neuropathy   . Type II diabetes mellitus ([place])   . Umbilical hernia    unrepaired (09/18/2013)    Past Surgical History:  Procedure Laterality Date  . CESAREAN SECTION  1989  . LAPAROSCOPIC ASSISTED VAGINAL HYSTERECTOMY  09/23/2012   Procedure:  LAPAROSCOPIC ASSISTED VAGINAL HYSTERECTOMY;  Surgeon: Linda Hedges, DO;  Location: [facility];  Service: Gynecology;  Laterality: N/A;  pull Dr Gregor Hams instrument  . LEFT HEART CATH AND CORONARY ANGIOGRAPHY N/A 02/11/2017   Procedure: Left Heart Cath and Coronary Angiography;  Surgeon: Lorretta Harp, MD;  Location: [facility];  Service: Cardiovascular;  Laterality: N/A;  . TUBAL LIGATION Bilateral 1992     reports that she quit smoking about 27 years ago. Her smoking use included cigarettes. She quit after 0.20 years of use. She has never used smokeless tobacco. She reports that she has current or past drug history. Drug: Marijuana. Frequency: 3.00 times per week. She reports that she does not drink alcohol.  Allergies  Allergen Reactions  . Codeine Hives, Anxiety and Other (See Comments)    Family History  Problem Relation Age of Onset  . Diabetes Other   . Heart disease Other   . Heart failure Mother 72       Her heart skipped a beat and stopped - per pt  . Diabetes Maternal Grandmother   . Alzheimer's disease Maternal Grandmother   . Coronary artery disease Maternal Grandmother   . Heart attack Maternal Grandfather   . Heart failure Brother     Prior to Admission medications   Medication Sig Start Date End Date Taking? Authorizing Provider  aspirin 81 MG chewable tablet Chew 1 tablet (  81 mg total) by mouth daily. 02/12/17  Yes Jani Gravel, MD  atorvastatin (LIPITOR) 80 MG tablet Take 1 tablet (80 mg total) by mouth daily at 6 PM. 03/13/18  Yes Bedsole, Amy E, MD  ezetimibe (ZETIA) 10 MG tablet Take 1 tablet (10 mg total) by mouth daily. 09/11/16  Yes Bedsole, Amy E, MD  Insulin Glargine (BASAGLAR KWIKPEN) 100 UNIT/ML SOPN Inject 0.22 mLs (22 Units total) into the skin at bedtime. 02/04/18  Yes Darrick Meigs, Marge Duncans, MD  insulin lispro (HUMALOG KWIKPEN) 100 UNIT/ML KiwkPen Inject into skin 3 times a day at meal time according to the following sliding scale: CBG 70-120 - 0 units / CBG 121-150  - 1 unit / CBG 151-200 - 2 units / CBG 201-250 - 3 units / CBG 251-300 - 5 units / CBG 301-350 - 7 units / CBG 351-400+ - 9 units  YOU MUST FOLLOW A STRICT DIABETIC DIET 02/04/18  Yes Darrick Meigs, Marge Duncans, MD  levothyroxine (SYNTHROID, LEVOTHROID) 150 MCG tablet TAKE 1 TABLET BY MOUTH EVERY DAY 05/08/18  Yes Bedsole, Amy E, MD  losartan (COZAAR) 25 MG tablet Take 25 mg by mouth daily. 05/27/17  Yes [provider]  metoCLOPramide (REGLAN) 5 MG tablet TAKE 1 TABLET BY MOUTH 4 TIMES A DAY BEFORE MEALS AND AT BEDTIME 05/09/18  Yes Bedsole, Amy E, MD  metoprolol succinate (TOPROL-XL) 25 MG 24 hr tablet Take 25 mg by mouth daily. 03/24/17  Yes [provider]  nitroGLYCERIN (NITRODUR - DOSED IN MG/24 HR) 0.2 mg/hr patch Place 1 patch (0.2 mg total) onto the skin daily as needed. Patient taking differently: Place 0.2 mg onto the skin daily.  12/14/17  Yes Regalado, Belkys A, MD  pantoprazole (PROTONIX) 40 MG tablet Take 1 tablet (40 mg total) by mouth daily at 12 noon. 01/28/18  Yes Bedsole, Amy E, MD  RELPAX 40 MG tablet ONE TABLET BY MOUTH AT ONSET OF HEADACHE. MAY REPEAT IN 2 HOURS IF HEADACHE PERSISTS OR RECURS. 10/17/16  Yes Bedsole, Amy E, MD  sertraline (ZOLOFT) 50 MG tablet Take 1 tablet (50 mg total) by mouth daily. 12/16/17  Yes Bedsole, Amy E, MD  Vitamin D, Ergocalciferol, (DRISDOL) 50000 units CAPS capsule TAKE 1 CAPSULE BY MOUTH EVERY 7 DAYS 04/22/18  Yes Bedsole, Amy E, MD  Insulin Pen Needle (PEN NEEDLES) 31G X 8 MM MISC Use to inject insulin 4 times a day.  Dx: E11.10 12/23/17   Jinny Sanders, MD  Lancets (ONETOUCH ULTRASOFT) lancets Use to check blood sugar daily as needed.  Dx: E11.10 12/23/17   Bedsole, Amy E, MD  ondansetron (ZOFRAN) 4 MG tablet TAKE 1 TABLET BY MOUTH EVERY 6 HOURS AS NEEDED FOR NAUSEA AND VOMITING Patient not taking: Reported on 05/26/2018 04/15/18   Jinny Sanders, MD  promethazine (PHENERGAN) 25 MG suppository Place 1 suppository (25 mg total) rectally every 6  (six) hours as needed for nausea or vomiting. Patient not taking: Reported on 05/26/2018 12/26/17   Macarthur Critchley, MD    Physical Exam: Vitals:   05/26/18 0000 05/26/18 0015 05/26/18 0100 05/26/18 0200  BP: 116/82 105/79 102/79 (!) 80/63  Pulse: (!) 124 (!) 118 (!) 115 (!) 110  Resp: (!) 21 15 (!) 22 20  Temp:      TempSrc:      SpO2: 98% 98% 95% 95%  Weight:      Height:          Constitutional: Moderately built and nourished. Vitals:  05/26/18 0000 05/26/18 0015 05/26/18 0100 05/26/18 0200  BP: 116/82 105/79 102/79 (!) 80/63  Pulse: (!) 124 (!) 118 (!) 115 (!) 110  Resp: (!) 21 15 (!) 22 20  Temp:      TempSrc:      SpO2: 98% 98% 95% 95%  Weight:      Height:       Eyes: Anicteric no pallor. ENMT: No discharge from the ears eyes nose or mouth. Neck: No mass felt.  No JVD appreciated. Respiratory: No rhonchi or crepitations. Cardiovascular: S1-S2 heard no murmurs appreciated. Abdomen: Soft nontender bowel sounds present. Musculoskeletal: No edema.  No joint effusion. Skin: No rash. Neurologic: Alert awake oriented to time place and person.  Moves all extremities. Psychiatric: Appears normal per normal affect.   Labs on Admission: I have personally reviewed following labs and imaging studies  CBC: Recent Labs  Lab 05/25/18 2357 05/26/18 0003  WBC  --  21.3*  NEUTROABS  --  17.9*  HGB 18.4* 17.2*  HCT 54.0* 51.8*  MCV  --  84.2  PLT  --  540*   Basic Metabolic Panel: Recent Labs  Lab 05/25/18 2357 05/26/18 0003  NA 128* 129*  K 4.5 3.0*  CL 95* 86*  CO2  --  12*  GLUCOSE >700* 701*  BUN 17 14  CREATININE 0.60 1.36*  CALCIUM  --  9.4   GFR: Estimated Creatinine Clearance: 37.6 mL/min (A) (by C-G formula based on SCr of 1.36 mg/dL (H)). Liver Function Tests: Recent Labs  Lab 05/26/18 0003  AST 19  ALT 16  ALKPHOS 127*  BILITOT 2.0*  PROT 7.8  ALBUMIN 4.2   No results for input(s): LIPASE, AMYLASE in the last 168 hours. No  results for input(s): AMMONIA in the last 168 hours. Coagulation Profile: No results for input(s): INR, PROTIME in the last 168 hours. Cardiac Enzymes: No results for input(s): CKTOTAL, CKMB, CKMBINDEX, TROPONINI in the last 168 hours. BNP (last 3 results) No results for input(s): PROBNP in the last 8760 hours. HbA1C: No results for input(s): HGBA1C in the last 72 hours. CBG: Recent Labs  Lab 05/26/18 0001 05/26/18 0104 05/26/18 0207  GLUCAP >600* >600* 508*   Lipid Profile: No results for input(s): CHOL, HDL, LDLCALC, TRIG, CHOLHDL, LDLDIRECT in the last 72 hours. Thyroid Function Tests: No results for input(s): TSH, T4TOTAL, FREET4, T3FREE, THYROIDAB in the last 72 hours. Anemia Panel: No results for input(s): VITAMINB12, FOLATE, FERRITIN, TIBC, IRON, RETICCTPCT in the last 72 hours. Urine analysis:    Component Value Date/Time   COLORURINE STRAW (A) 05/26/2018 0129   APPEARANCEUR CLEAR 05/26/2018 0129   LABSPEC 1.027 05/26/2018 0129   PHURINE 5.0 05/26/2018 0129   GLUCOSEU >=500 (A) 05/26/2018 0129   HGBUR SMALL (A) 05/26/2018 0129   BILIRUBINUR NEGATIVE 05/26/2018 0129   BILIRUBINUR negative 09/11/2016 1038   KETONESUR 80 (A) 05/26/2018 0129   PROTEINUR 100 (A) 05/26/2018 0129   UROBILINOGEN 0.2 09/11/2016 1038   UROBILINOGEN 0.2 10/28/2013 1723   NITRITE NEGATIVE 05/26/2018 0129   LEUKOCYTESUR NEGATIVE 05/26/2018 0129   Sepsis Labs: @LABRCNTIP (procalcitonin:4,lacticidven:4) )No results found for this or any previous visit (from the past 240 hour(s)).   Radiological Exams on Admission: Dg Chest 2 View  Result Date: 05/26/2018 CLINICAL DATA:  Central chest pain, feeling unwell. Coffee-ground emesis. History of hypertension, diabetes. EXAM: CHEST - 2 VIEW COMPARISON:  Chest radiograph February 26, 2018 FINDINGS: Cardiomediastinal silhouette is normal. No pleural effusions or focal consolidations. Trachea projects midline  and there is no pneumothorax. Soft tissue planes  and included osseous structures are non-suspicious. Multiple EKG lines overlie the patient and may obscure subtle underlying pathology. IMPRESSION: Negative. Electronically Signed   By: Elon Alas M.D.   On: 05/26/2018 01:33    EKG: Independently reviewed.  Normal sinus rhythm with nonspecific ST changes in the anterior leads.  Will discuss with cardiologist.  Assessment/Plan Principal Problem:   DKA (diabetic ketoacidoses) ([place]) Active Problems:   Hypothyroidism   Essential hypertension   AKI (acute kidney injury) ([place])   Chest discomfort   ARF (acute renal failure) ([place])    1. Diabetic ketoacidosis type II -patient's last hemoglobin A1c recorded 3 months ago in March 2019 was around 12.  Patient's complaints could be the reason for her DKA.  Continue with aggressive hydration follow metabolic panel IV insulin infusion has been started which could be changed to subcu once patient's anion gap gets corrected. 2. Persistent nausea vomiting with dark emesis with mild diarrhea -stool for occult blood was negative.  Check CBC closely.  PRN Reglan. 3. Acute kidney injury likely from nausea vomiting and blood pressure was low normal.  Continue with aggressive hydration follow metabolic panel. 4. Hypertension on metoprolol.  May have to hold if patient blood pressure remains low.  Was low at presentation.  No signs of any sepsis. 5. Leukocytosis likely reactionary.  Patient is afebrile.  No signs of any infection.  Closely monitor. 6. Hypothyroidism -we will dose Synthroid as IV until patient can relabel to take orally. 7. Chronic chest pain -we will cycle cardiac markers.  Will discuss with cardiologist about EKG.  Patient has had normal coronary arteries in March 2018 cardiac cath. 8. Hypokalemia -probably from vomiting.  Aggressively replace potassium.  Check magnesium levels.  DVT prophylaxis: Lovenox. Code Status: Full code. Family Communication: Discussed with patient. Disposition  Plan: Home. Consults called: None. Admission status: Inpatient.   [name] MD [facility] Pager [phone].  If 7PM-7AM, please contact night-coverage [email or web address] Password [email or web address]  05/26/2018, 2:42 AM

## 2018-05-26 NOTE — ED Notes (Signed)
Insulin drip verified by matt rn

## 2018-05-26 NOTE — ED Notes (Signed)
Pt prefers side lying position as a position of comfort. Blood pressure noted to be lower when patient is in side lying position. Pt's blood pressure higher when laying on her back.

## 2018-05-27 DIAGNOSIS — E876 Hypokalemia: Secondary | ICD-10-CM

## 2018-05-27 DIAGNOSIS — E039 Hypothyroidism, unspecified: Secondary | ICD-10-CM

## 2018-05-27 DIAGNOSIS — N179 Acute kidney failure, unspecified: Secondary | ICD-10-CM

## 2018-05-27 LAB — BASIC METABOLIC PANEL
Anion gap: 11 (ref 5–15)
BUN: 11 mg/dL (ref 6–20)
CALCIUM: 8.1 mg/dL — AB (ref 8.9–10.3)
CHLORIDE: 110 mmol/L (ref 101–111)
CO2: 17 mmol/L — ABNORMAL LOW (ref 22–32)
CREATININE: 0.82 mg/dL (ref 0.44–1.00)
GFR calc Af Amer: >60 mL/min (ref >60)
Glucose, Bld: 261 mg/dL — ABNORMAL HIGH (ref 65–99)
Potassium: 3.8 mmol/L (ref 3.5–5.1)
SODIUM: 138 mmol/L (ref 135–145)

## 2018-05-27 LAB — GLUCOSE, CAPILLARY
GLUCOSE-CAPILLARY: 184 mg/dL — AB (ref 65–99)
Glucose-Capillary: 265 mg/dL — ABNORMAL HIGH (ref 65–99)
Glucose-Capillary: 139 mg/dL — ABNORMAL HIGH (ref 65–99)
Glucose-Capillary: 132 mg/dL — ABNORMAL HIGH (ref 65–99)

## 2018-05-27 LAB — CBC
HCT: 44.9 % (ref 36.0–46.0)
Hemoglobin: 14.8 g/dL (ref 12.0–15.0)
MCH: 28.2 pg (ref 26.0–34.0)
MCHC: 33 g/dL (ref 30.0–36.0)
MCV: 85.7 fL (ref 78.0–100.0)
PLATELETS: 304 10*3/uL (ref 150–400)
RBC: 5.24 MIL/uL — ABNORMAL HIGH (ref 3.87–5.11)
RDW: 14.8 % (ref 11.5–15.5)
WBC: 15 10*3/uL — AB (ref 4.0–10.5)

## 2018-05-27 LAB — TROPONIN I: Troponin I: 0.09 ng/mL (ref <0.03)

## 2018-05-27 MED ORDER — INSULIN ASPART 100 UNIT/ML ~~LOC~~ SOLN: 4.0000 [IU] | Freq: Three times a day (TID) | SUBCUTANEOUS | Status: DC

## 2018-05-27 MED ORDER — KETOROLAC TROMETHAMINE 10 MG PO TABS
10.0000 mg | ORAL_TABLET | Freq: Four times a day (QID) | ORAL | Status: DC | PRN
  Administered 2018-05-28: 10 mg via ORAL
  Filled 2018-05-27 (×2): qty 1

## 2018-05-27 MED ORDER — INSULIN ASPART 100 UNIT/ML ~~LOC~~ SOLN
2.0000 [IU] | Freq: Three times a day (TID) | SUBCUTANEOUS | Status: DC
  Administered 2018-05-28 (×2): 2 [IU] via SUBCUTANEOUS

## 2018-05-27 MED ORDER — NITROGLYCERIN 0.2 MG/HR TD PT24
0.2000 mg | MEDICATED_PATCH | Freq: Every day | TRANSDERMAL | Status: DC
  Administered 2018-05-27 – 2018-05-28 (×2): 0.2 mg via TRANSDERMAL
  Filled 2018-05-27 (×2): qty 1

## 2018-05-27 MED ORDER — NITROGLYCERIN 0.4 MG SL SUBL: 0.4000 mg | SUBLINGUAL_TABLET | SUBLINGUAL | Status: DC | PRN

## 2018-05-27 NOTE — Progress Notes (Signed)
Inpatient Diabetes Program Recommendations  AACE/ADA: New Consensus Statement on Inpatient Glycemic Control (2019)  Target Ranges:  Prepandial:   less than 140 mg/dL      Peak postprandial:   less than 180 mg/dL (1-2 hours)      Critically ill patients:  140 - 180 mg/dL   Results for [name] [name], [name] [name] (MRN [id number]) as of 05/27/2018 09:41  Ref. Range 05/26/2018 08:18 05/26/2018 09:37 05/26/2018 10:55 05/26/2018 12:03 05/26/2018 13:09 05/26/2018 17:43 05/26/2018 21:58 05/27/2018 08:02  Glucose-Capillary Latest Ref Range: 65 - 99 mg/dL 189 (H) 176 (H) 223 (H) 167 (H)  Lantus 22 units 131 (H) 250 (H)  Novolog 5 units 180 (H) 265 (H)  Novolog 8 unis  Lantus 22 units    Review of Glycemic Control  Outpatient Diabetes medications: Basagalar 22 units QHS, Humalog 0-9 units TID with meals Current orders for Inpatient glycemic control: Lantus 22 units daily, Novolog 0-15 units TID with meals, Novolog 0-5 units QHS  Inpatient Diabetes Program Recommendations: Insulin - Meal Coverage: Please consider ordering Novolog 3 units TID with meals for meal coverage if patient eats at least 50% of meals.  Thanks, Barnie Alderman, RN, MSN, CDE Diabetes Coordinator Inpatient Diabetes Program [phone] (Team Pager from 8am to 5pm)

## 2018-05-27 NOTE — Progress Notes (Signed)
ZPt c/o CP 8/10 midsternal pressure.  Also c/o nausea.  Phenergan given and MD notified of CP and no meds for pain. New orders received. Pt lying on side in bed; states she is too sick to eat but CP was relieved with phenergan.  No meds given at this time.

## 2018-05-27 NOTE — Progress Notes (Signed)
[place] TEAM 1 - Stepdown/ICU TEAM  SHAKHIA GRAMAJO  FVC:[id number] DOB: 1961/11/15 DOA: 05/25/2018 PCP: Jinny Sanders, MD    Brief Narrative:  57 y.o. female w/ a hx of DM2, hypertension, and hypothyroidism who presented to the ED w/ elevated blood sugars, nausea/vomiting, and chest pain which is chronic.  In the ER her blood sugar was ~700 with anion gap of 31.    Subjective: Has c/o chest pain off an on today, but tells me it is no different than her usual chronic pain.  She denies sob.  Her appetite has improved despite a low grade nausea today.   Assessment & Plan:  Uncontrolled DM 2 with DKA CBG much improved, but bicarb is falling - anion gap upper limit of normal - adjust insulin regimen today - follow CBGs and BMET   Nausea/vomiting with diarrhea - known Diabetic Gastroparesis Appears well compensated at this time  DiabeticPeripheral neuropathy Continue Neurontin- well controlled   Acute kidney injury Due to DKA - resolved   HTN BP well controlled  Chronic chest pain - mildly elevated troponin Troponin normalizing w/ resolution of DKA - pt uses "prn" nitro patch at home - resume - she is followed by [facility] and had a normal cath March 6384  Chronic systolic CHF EF 66-59% on TTE 09/20/2017 - holding BB and ARB due to relative hypotension - no clinical evidence of volume overload at this time   Hypothyroidism Cont home synthroid dose  Hypokalemia Corrected - due to DKA   DVT prophylaxis: Lovenox Code Status: FULL CODE Family Communication: no family present at time of exam  Disposition Plan: transition to tele bed - adjust insulin - f/u CBG and bicarb/anion gap - possible d/c in 24hrs   Consultants:  none  Procedures: none  Antimicrobials:  none  Objective: Blood pressure 111/81, pulse (!) 104, temperature 98.6 F (37 C), temperature source Oral, resp. rate 14, height 4\' 11"  (1.499 m), weight 61.6 kg (135 lb 12.9 oz), last menstrual period  08/11/2012, SpO2 95 %.  Intake/Output Summary (Last 24 hours) at 05/27/2018 1406 Last data filed at 05/27/2018 1300 Gross per 24 hour  Intake 2926.25 ml  Output 800 ml  Net 2126.25 ml   Filed Weights   05/25/18 2334 05/26/18 1403  Weight: 61.2 kg (135 lb) 61.6 kg (135 lb 12.9 oz)    Examination: General: No acute respiratory distress Lungs: Clear to auscultation bilaterally without wheezes or crackles Cardiovascular: Regular rate and rhythm without murmur gallop or rub normal S1 and S2 Abdomen: Nontender, nondistended, soft, bowel sounds positive, no rebound, no ascites, no appreciable mass Extremities: No significant cyanosis, clubbing, or edema bilateral lower extremities   CBC: Recent Labs  Lab 05/25/18 2357 05/26/18 0003 05/26/18 0508 05/27/18 0459  WBC  --  21.3* 23.8* 15.0*  NEUTROABS  --  17.9*  --   --   HGB 18.4* 17.2* 16.3* 14.8  HCT 54.0* 51.8* 47.8* 44.9  MCV  --  84.2 83.7 85.7  PLT  --  435* 364 935   Basic Metabolic Panel: Recent Labs  Lab 05/26/18 0003 05/26/18 0508 05/26/18 0921 05/26/18 1355 05/27/18 0459  NA 129* 135 135 137 138  K 3.0* 3.3* 3.4* 3.6 3.8  CL 86* 102 107 106 110  CO2 12* 19* 19* 19* 17*  GLUCOSE 701* 353* 179* 138* 261*  BUN 14 13 12 12 11   CREATININE 1.36* 1.00 0.74 0.67 0.82  CALCIUM 9.4 8.6* 8.5* 8.7* 8.1*  GFR: Estimated Creatinine Clearance: 62.7 mL/min (by C-G formula based on SCr of 0.82 mg/dL).  Liver Function Tests: Recent Labs  Lab 05/26/18 0003  AST 19  ALT 16  ALKPHOS 127*  BILITOT 2.0*  PROT 7.8  ALBUMIN 4.2    Cardiac Enzymes: Recent Labs  Lab 05/26/18 0508 05/26/18 0921 05/26/18 1355 05/27/18 0459  TROPONINI 0.40* 0.34* 0.29* 0.09*    HbA1C: Hgb A1c MFr Bld  Date/Time Value Ref Range Status  02/02/2018 12:02 PM 12.7 (H) 4.8 - 5.6 % Final    Comment:    (NOTE)         Prediabetes: 5.7 - 6.4         Diabetes: >6.4         Glycemic control for adults with diabetes: <7.0   12/11/2017  02:16 PM 11.1 (H) 4.8 - 5.6 % Final    Comment:    (NOTE) Pre diabetes:          5.7%-6.4% Diabetes:              >6.4% Glycemic control for   <7.0% adults with diabetes     CBG: Recent Labs  Lab 05/26/18 1309 05/26/18 1743 05/26/18 2158 05/27/18 0802 05/27/18 1222  GLUCAP 131* 250* 180* 265* 184*   Scheduled Meds: . aspirin EC  81 mg Oral Daily  . atorvastatin  80 mg Oral q1800  . enoxaparin (LOVENOX) injection  40 mg Subcutaneous Q24H  . ezetimibe  10 mg Oral Daily  . insulin aspart  0-15 Units Subcutaneous TID WC  . insulin aspart  0-5 Units Subcutaneous QHS  . insulin glargine  22 Units Subcutaneous Daily  . levothyroxine  150 mcg Oral QAC breakfast  . metoprolol succinate  25 mg Oral Daily  . pantoprazole  40 mg Oral Q1200  . potassium chloride  40 mEq Oral Once  . sertraline  50 mg Oral Daily     LOS: 1 day   Cherene Altes, MD [facility] Office  [phone] Pager - Text Page per Amion as per below:  On-Call/Text Page:      [email or web address]      password [email or web address]  If 7PM-7AM, please contact night-coverage [email or web address] Password [email or web address] 05/27/2018, 2:06 PM

## 2018-05-28 DIAGNOSIS — I5022 Chronic systolic (congestive) heart failure: Secondary | ICD-10-CM

## 2018-05-28 DIAGNOSIS — E1129 Type 2 diabetes mellitus with other diabetic kidney complication: Secondary | ICD-10-CM

## 2018-05-28 DIAGNOSIS — E1110 Type 2 diabetes mellitus with ketoacidosis without coma: Principal | ICD-10-CM

## 2018-05-28 DIAGNOSIS — E1140 Type 2 diabetes mellitus with diabetic neuropathy, unspecified: Secondary | ICD-10-CM

## 2018-05-28 DIAGNOSIS — I110 Hypertensive heart disease with heart failure: Secondary | ICD-10-CM

## 2018-05-28 LAB — BASIC METABOLIC PANEL
Anion gap: 9 (ref 5–15)
BUN: 6 mg/dL (ref 6–20)
CHLORIDE: 109 mmol/L (ref 101–111)
CO2: 22 mmol/L (ref 22–32)
CREATININE: 0.57 mg/dL (ref 0.44–1.00)
Calcium: 7.9 mg/dL — ABNORMAL LOW (ref 8.9–10.3)
GFR calc non Af Amer: >60 mL/min (ref >60)
GLUCOSE: 102 mg/dL — AB (ref 65–99)
Potassium: 2.9 mmol/L — ABNORMAL LOW (ref 3.5–5.1)
Sodium: 140 mmol/L (ref 135–145)

## 2018-05-28 LAB — GLUCOSE, CAPILLARY
Glucose-Capillary: 91 mg/dL (ref 65–99)
Glucose-Capillary: 159 mg/dL — ABNORMAL HIGH (ref 65–99)

## 2018-05-28 LAB — HEMOGLOBIN A1C
Hgb A1c MFr Bld: 16.7 % — ABNORMAL HIGH (ref 4.8–5.6)
MEAN PLASMA GLUCOSE: 432.59 mg/dL

## 2018-05-28 MED ORDER — POTASSIUM CHLORIDE CRYS ER 20 MEQ PO TBCR
60.0000 meq | EXTENDED_RELEASE_TABLET | Freq: Once | ORAL | Status: AC
  Administered 2018-05-28: 60 meq via ORAL
  Filled 2018-05-28: qty 3

## 2018-05-28 MED ORDER — POTASSIUM CHLORIDE 10 MEQ/100ML IV SOLN
10.0000 meq | INTRAVENOUS | Status: AC
  Administered 2018-05-28 (×3): 10 meq via INTRAVENOUS
  Filled 2018-05-28 (×3): qty 100

## 2018-05-28 MED ORDER — SODIUM CHLORIDE 0.9 % IV SOLN
INTRAVENOUS | Status: DC | PRN
  Administered 2018-05-28: 11:00:00 via INTRAVENOUS

## 2018-05-28 MED ORDER — INSULIN LISPRO 100 UNIT/ML (KWIKPEN): PEN_INJECTOR | SUBCUTANEOUS | 0 refills | Status: DC

## 2018-05-28 MED ORDER — BASAGLAR KWIKPEN 100 UNIT/ML ~~LOC~~ SOPN: 22.0000 [IU] | PEN_INJECTOR | Freq: Every day | SUBCUTANEOUS | 0 refills | Status: DC

## 2018-05-28 MED ORDER — POTASSIUM CHLORIDE CRYS ER 20 MEQ PO TBCR: 20.0000 meq | EXTENDED_RELEASE_TABLET | Freq: Every day | ORAL | 0 refills | Status: DC

## 2018-05-28 NOTE — Progress Notes (Signed)
Inpatient Diabetes Program Recommendations  AACE/ADA: New Consensus Statement on Inpatient Glycemic Control (2019)  Target Ranges:  Prepandial:   less than 140 mg/dL      Peak postprandial:   less than 180 mg/dL (1-2 hours)      Critically ill patients:  140 - 180 mg/dL  Results for [name] [name], [name] [name] (MRN [id number]) as of 05/28/2018 15:03  Ref. Range 05/27/2018 08:02 05/27/2018 12:22 05/27/2018 17:03 05/27/2018 21:06 05/28/2018 08:07 05/28/2018 12:13  Glucose-Capillary Latest Ref Range: 65 - 99 mg/dL 265 (H) 184 (H) 139 (H) 132 (H) 91 159 (H)   Results for [name] [name], [name] [name] (MRN [id number]) as of 05/28/2018 15:03  Ref. Range 02/02/2018 12:02 05/28/2018 05:35  Hemoglobin A1C Latest Ref Range: 4.8 - 5.6 % 12.7 (H) 16.7 (H)   Review of Glycemic Control  Outpatient Diabetes medications: Basagalar 22 units QHS, Humalog 0-9 units TID with meals Current orders for Inpatient glycemic control: Lantus 22 units daily, Novolog 0-15 units TID with meals, Novolog 0-5 units QHS, Novolog 2 units TID with meals  Inpatient Diabetes Program Recommendations: HgbA1C: A1C 16.7% on 05/28/18 indicating an average glucose of 433 mg/dl over the past 2-3 months.  NOTE: Spoke with patient about diabetes and home regimen for diabetes control. Patient reports that she is followed by PCP for diabetes management and currently she takes Basaglar 22 units QHS and Humalog 0-10 units TID with meals (based on sliding scale) as an outpatient for diabetes control. Patient reports that she is taking insulin as prescribed and that she is checking her glucose 3 times per day. Patient reports that her glucose is generally always over 200 mg/dl and that she feels bad if her glucose is less than 200 mg/dl.  Inquired about any hypoglycemia (less than 70 mg/dl) and patient reports that she only recalls her glucose being less than 70 mg/dl once in the past year (it was 61 mg/dl).  Inquired about how she felt today and she states that she feels tired but  okay. Explained that her fasting glucose was 91 mg/dl this morning and 159 mg/dl at noon today. Patient denies any hypoglycemia symptoms today. Explained that she is prescribed Lantus 22 units (equivalent of her outpatient Basaglar dose) and her fasting glucose is within target range. Discussed how post prandial glucose is elevated and she likely needs to be taking Humalog for meal coverage as well as for correction when needed.  Discussed A1C results (16.7% on 05/28/18) and explained that her current A1C indicates an average glucose of 433 mg/dl over the past 2-3 months. Discussed glucose and A1C goals. Discussed importance of checking CBGs and maintaining good CBG control to prevent long-term and short-term complications. Explained how hyperglycemia leads to damage within blood vessels which lead to the common complications seen with uncontrolled diabetes. Stressed to the patient the importance of improving glycemic control to prevent further complications from uncontrolled diabetes. Discussed impact of nutrition, exercise, stress, sickness, and medications on diabetes control. Inquired about an Endocrinologist and patient states that she has seen Dr. Cruzita Lederer in the past but she fired her as a patient due to missed appointments. Asked patient to call PCP and ask to be referred to another Endocrinologist and to follow up with them so they can help with improving DM control. Encouraged patient to check her glucose 4 times per day (before meals and at bedtime) and to keep a log book of glucose readings and insulin taken which she will need to take to doctor appointments. Patient verbalized understanding  of information discussed and she states that she has no further questions at this time related to diabetes.  Thanks, Barnie Alderman, RN, MSN, CDE Diabetes Coordinator Inpatient Diabetes Program [phone] (Team Pager)

## 2018-05-28 NOTE — Discharge Summary (Signed)
Physician Discharge Summary  [name] [name] IWL:[id number] DOB: [birth year] DOA: 05/25/2018  PCP: Jinny Sanders, MD  Admit date: 05/25/2018 Discharge date: 05/28/2018  Admitted From: Home Disposition:  Home  Discharge Condition:Stable CODE STATUS:FULL Diet recommendation: Heart Healthy  Brief/Interim Summary: Patient is a20 y.o.femalew/ a hx of DM2, hypertension, and hypothyroidism who presented to the ED w/ elevated blood sugars, nausea/vomiting, and chest pain which is chronic. In the ER her blood sugar was ~700 with anion gap of 31. She was admitted for the management of DKA with insulin drip.  Now  gap has closed, insulin drip has been discontinued and she has been started on her usual insulin regimen.  Patient says she has insulin supplies at home but she is not taking it.  We counseled the importance of regular intake of insulin and monitoring of her blood glucose and follow-up with the PCP for further close monitoring of her diabetes.  Diabetic letter was also following during this admission.  Patient also has history of chronic chest pain.  This morning she was chest pain-free.  She remained comfortable today.  Plan is to discharge home today after potassium supplementation.  Following problems were addressed during hospitalization:  Uncontrolled DM 2 with DKA: CBG much improved.  Uncontrolled diabetes mellitus with him up in 1-16.7.  This is secondary to noncompliance.  Patient has been counseled about the importance of taking insulin on a regular basis as instructed/  Nausea/vomiting with diarrhea - known Diabetic Gastroparesis Currently stable.  Continue Reglan at home.  DiabeticPeripheral neuropathy Continue Neurontin   Acute kidney injury Due to DKA - resolved   HTN BP well controlled  Chronic chest pain - mildly elevated troponin Troponin normalizing with resolution of DKA - pt uses "prn" nitro patch at home - resume - she is followed by [facility] and had a  normal cath March 1740  Chronic systolic CHF EF 81-44% [id number] - holding BB and ARB due to relative hypotension - no clinical evidence of volume overload at this time  Hypothyroidism Cont home synthroid dose  Hypokalemia Being supplemented.  Check potassium level in a week by doing a BMP test as an outpatient.     Discharge Diagnoses:  Principal Problem:   DKA (diabetic ketoacidoses) ([place]) Active Problems:   Hypothyroidism   Essential hypertension   AKI (acute kidney injury) ([place])   Chest discomfort   ARF (acute renal failure) ([name])    Discharge Instructions  Discharge Instructions    Diet - low sodium heart healthy   Complete by:  As directed    Discharge instructions   Complete by:  As directed    1) Please follow-up with your PCP in a week.  Do a CBC and BMP test during the follow-up. 2) Check your A1c in 3 months. 3) Continue taking your insulin at home as prescribed.  Continue to monitor your blood sugars on a regular basis.   Increase activity slowly   Complete by:  As directed      Allergies as of 05/28/2018      Reactions   Codeine Hives, Anxiety, Other (See Comments)      Medication List    TAKE these medications   aspirin 81 MG chewable tablet Chew 1 tablet (81 mg total) by mouth daily.   atorvastatin 80 MG tablet Commonly known as:  LIPITOR Take 1 tablet (80 mg total) by mouth daily at 6 PM.   BASAGLAR KWIKPEN 100 UNIT/ML Sopn Inject 0.22 mLs (22 Units total)  into the skin at bedtime.   ezetimibe 10 MG tablet Commonly known as:  ZETIA Take 1 tablet (10 mg total) by mouth daily.   insulin lispro 100 UNIT/ML KiwkPen Commonly known as:  HUMALOG KWIKPEN Inject into skin 3 times a day at meal time according to the following sliding scale: CBG 70-120 - 0 units / CBG 121-150 - 1 unit / CBG 151-200 - 2 units / CBG 201-250 - 3 units / CBG 251-300 - 5 units / CBG 301-350 - 7 units / CBG 351-400+ - 9 units  YOU MUST FOLLOW A  STRICT DIABETIC DIET   levothyroxine 150 MCG tablet Commonly known as:  SYNTHROID, LEVOTHROID TAKE 1 TABLET BY MOUTH EVERY DAY   losartan 25 MG tablet Commonly known as:  COZAAR Take 25 mg by mouth daily.   metoCLOPramide 5 MG tablet Commonly known as:  REGLAN TAKE 1 TABLET BY MOUTH 4 TIMES A DAY BEFORE MEALS AND AT BEDTIME   metoprolol succinate 25 MG 24 hr tablet Commonly known as:  TOPROL-XL Take 25 mg by mouth daily.   nitroGLYCERIN 0.2 mg/hr patch Commonly known as:  NITRODUR - Dosed in mg/24 hr Place 1 patch (0.2 mg total) onto the skin daily as needed. What changed:  when to take this   ondansetron 4 MG tablet Commonly known as:  ZOFRAN TAKE 1 TABLET BY MOUTH EVERY 6 HOURS AS NEEDED FOR NAUSEA AND VOMITING   onetouch ultrasoft lancets Use to check blood sugar daily as needed.  Dx: E11.10   pantoprazole 40 MG tablet Commonly known as:  PROTONIX Take 1 tablet (40 mg total) by mouth daily at 12 noon.   Pen Needles 31G X 8 MM Misc Use to inject insulin 4 times a day.  Dx: E11.10   potassium chloride SA 20 MEQ tablet Commonly known as:  K-DUR,KLOR-CON Take 1 tablet (20 mEq total) by mouth daily. Start taking on:  05/29/2018   promethazine 25 MG suppository Commonly known as:  PHENERGAN Place 1 suppository (25 mg total) rectally every 6 (six) hours as needed for nausea or vomiting.   RELPAX 40 MG tablet Generic drug:  eletriptan ONE TABLET BY MOUTH AT ONSET OF HEADACHE. MAY REPEAT IN 2 HOURS IF HEADACHE PERSISTS OR RECURS.   sertraline 50 MG tablet Commonly known as:  ZOLOFT Take 1 tablet (50 mg total) by mouth daily.   Vitamin D (Ergocalciferol) 50000 units Caps capsule Commonly known as:  DRISDOL TAKE 1 CAPSULE BY MOUTH EVERY 7 DAYS      Follow-up Information    Bedsole, Amy E, MD. Schedule an appointment as soon as possible for a visit in 1 week(s).   Specialty:  Family Medicine Contact information: [place]  [address] [phone]          Allergies  Allergen Reactions  . Codeine Hives, Anxiety and Other (See Comments)    Consultations: None  Procedures/Studies: Dg Chest 2 View  Result Date: 05/26/2018 CLINICAL DATA:  Central chest pain, feeling unwell. Coffee-ground emesis. History of hypertension, diabetes. EXAM: CHEST - 2 VIEW COMPARISON:  Chest radiograph February 26, 2018 FINDINGS: Cardiomediastinal silhouette is normal. No pleural effusions or focal consolidations. Trachea projects midline and there is no pneumothorax. Soft tissue planes and included osseous structures are non-suspicious. Multiple EKG lines overlie the patient and may obscure subtle underlying pathology. IMPRESSION: Negative. Electronically Signed   By: Elon Alas M.D.   On: 05/26/2018 01:33       Subjective:  Patient seen and examined the bedside this morning.  Remains comfortable.  No new issues/events.  Stable for discharge to home today.  Discharge Exam: Vitals:   05/28/18 0641 05/28/18 0745  BP: 112/74 114/68  Pulse: 90 93  Resp: 15 18  Temp:  98.7 F (37.1 C)  SpO2: 98% 99%   Vitals:   05/28/18 0100 05/28/18 0421 05/28/18 0641 05/28/18 0745  BP: 114/60  112/74 114/68  Pulse:   90 93  Resp:   15 18  Temp:  98.6 F (37 C)  98.7 F (37.1 C)  TempSrc:  Oral  Oral  SpO2:   98% 99%  Weight:      Height:        General: Pt is alert, awake, not in acute distress Cardiovascular: RRR, S1/S2 +, no rubs, no gallops Respiratory: CTA bilaterally, no wheezing, no rhonchi Abdominal: Soft, NT, ND, bowel sounds + Extremities: no edema, no cyanosis    The results of significant diagnostics from this hospitalization (including imaging, microbiology, ancillary and laboratory) are listed below for reference.     Microbiology: No results found for this or any previous visit (from the past 240 hour(s)).   Labs: BNP (last 3 results) No results for input(s): BNP in the last 8760 hours. Basic  Metabolic Panel: Recent Labs  Lab 05/26/18 0508 05/26/18 0921 05/26/18 1355 05/27/18 0459 05/28/18 0535  NA 135 135 137 138 140  K 3.3* 3.4* 3.6 3.8 2.9*  CL 102 107 106 110 109  CO2 19* 19* 19* 17* 22  GLUCOSE 353* 179* 138* 261* 102*  BUN 13 12 12 11 6   CREATININE 1.00 0.74 0.67 0.82 0.57  CALCIUM 8.6* 8.5* 8.7* 8.1* 7.9*   Liver Function Tests: Recent Labs  Lab 05/26/18 0003  AST 19  ALT 16  ALKPHOS 127*  BILITOT 2.0*  PROT 7.8  ALBUMIN 4.2   No results for input(s): LIPASE, AMYLASE in the last 168 hours. No results for input(s): AMMONIA in the last 168 hours. CBC: Recent Labs  Lab 05/25/18 2357 05/26/18 0003 05/26/18 0508 05/27/18 0459  WBC  --  21.3* 23.8* 15.0*  NEUTROABS  --  17.9*  --   --   HGB 18.4* 17.2* 16.3* 14.8  HCT 54.0* 51.8* 47.8* 44.9  MCV  --  84.2 83.7 85.7  PLT  --  435* 364 304   Cardiac Enzymes: Recent Labs  Lab 05/26/18 0508 05/26/18 0921 05/26/18 1355 05/27/18 0459  TROPONINI 0.40* 0.34* 0.29* 0.09*   BNP: Invalid input(s): POCBNP CBG: Recent Labs  Lab 05/27/18 0802 05/27/18 1222 05/27/18 1703 05/27/18 2106 05/28/18 0807  GLUCAP 265* 184* 139* 132* 91   D-Dimer No results for input(s): DDIMER in the last 72 hours. Hgb A1c Recent Labs    05/28/18 0535  HGBA1C 16.7*   Lipid Profile No results for input(s): CHOL, HDL, LDLCALC, TRIG, CHOLHDL, LDLDIRECT in the last 72 hours. Thyroid function studies No results for input(s): TSH, T4TOTAL, T3FREE, THYROIDAB in the last 72 hours.  Invalid input(s): FREET3 Anemia work up No results for input(s): VITAMINB12, FOLATE, FERRITIN, TIBC, IRON, RETICCTPCT in the last 72 hours. Urinalysis    Component Value Date/Time   COLORURINE STRAW (A) 05/26/2018 0129   APPEARANCEUR CLEAR 05/26/2018 0129   LABSPEC 1.027 05/26/2018 0129   PHURINE 5.0 05/26/2018 0129   GLUCOSEU >=500 (A) 05/26/2018 0129   HGBUR SMALL (A) 05/26/2018 0129   BILIRUBINUR NEGATIVE 05/26/2018 0129    BILIRUBINUR negative 09/11/2016 1038   KETONESUR  80 (A) 05/26/2018 0129   PROTEINUR 100 (A) 05/26/2018 0129   UROBILINOGEN 0.2 09/11/2016 1038   UROBILINOGEN 0.2 10/28/2013 1723   NITRITE NEGATIVE 05/26/2018 0129   LEUKOCYTESUR NEGATIVE 05/26/2018 0129   Sepsis Labs Invalid input(s): PROCALCITONIN,  WBC,  LACTICIDVEN Microbiology No results found for this or any previous visit (from the past 240 hour(s)).  Please note: You were cared for by a hospitalist during your hospital stay. Once you are discharged, your primary care physician will handle any further medical issues. Please note that NO REFILLS for any discharge medications will be authorized once you are discharged, as it is imperative that you return to your primary care physician (or establish a relationship with a primary care physician if you do not have one) for your post hospital discharge needs so that they can reassess your need for medications and monitor your lab values.    Time coordinating discharge: 40 minutes  SIGNED:   Shelly Coss, MD  [facility] 05/28/2018, 10:37 AM Pager [phone]  If 7PM-7AM, please contact night-coverage [email or web address] Password [email or web address]

## 2018-05-28 NOTE — Care Management Note (Signed)
Case Management Note  Patient Details  Name: [name] [name] MRN: [id number] Date of Birth: Jan 27, 1961  Subjective/Objective:     Pt admitted with DKA. She is from home with spouse.               Action/Plan: Pt discharging home with self care.  PCP: Dr Diona Browner Insurance: [employer]  Pt states she has transportation home.   Expected Discharge Date:  05/28/18               Expected Discharge Plan:  Home/Self Care  In-House Referral:     Discharge planning Services     Post Acute Care Choice:    Choice offered to:     DME Arranged:    DME Agency:     HH Arranged:    HH Agency:     Status of Service:  Completed, signed off  If discussed at [employer] of Stay Meetings, dates discussed:    Additional Comments:  Pollie Friar, RN 05/28/2018, 2:20 PM

## 2018-05-29 ENCOUNTER — Telehealth: Payer: Self-pay | Admitting: *Deleted

## 2018-05-29 NOTE — Progress Notes (Deleted)
Cardiology Office Note   Date:  05/29/2018   ID:  JUDE LINCK, DOB 1961/04/05, MRN [id number]  PCP:  Jinny Sanders, MD  Cardiologist:   Minus Breeding, MD   No chief complaint on file.     History of Present Illness: [name] [name] is a 57 y.o. female who presents for follow up of atrial fib.  She had atrial fib in 2012.  She had an abnormal stress test with normal coronaries on cath.  EF is 55%.   In 2018 she was found to have an elevated troponin and an EF of 35-40%.  Cardiac cath demonstrated normal coronaries however. We saw her in the hospital in consultation in Jan with poorly controlled blood sugars there was a question of her compliance with her ace inhibitors and her beta-blockers.  In May she had a negative Lexiscan Myoview with a normal EF and no ischemia.   Since we last saw her in the office she was in the hospital earlier this week.  I reviewed these records for this visit.  She had DKA secondary to medical non compliance.  She does not take her diabetes medications.  ***      She was managed medically.  She also had some chronic atypical chest pain.  I have reviewed extensive hospital records for this visit.    Since going home she has done relatively well.  She takes care of her 66-year-old grandchild and she is fatigued after this. She tries to get 6000 steps daily. She denies any acute shortness of breath, PND or orthopnea. She has no palpitations, presyncope or syncope. She had no weight gain or edema.     Past Medical History:  Diagnosis Date  . Anxiety   . B12 deficiency   . Chest pain 04/05/2017  . DKA (diabetic ketoacidoses) ([place]) 05/25/2018  . GERD (gastroesophageal reflux disease)   . Hyperlipidemia   . Hypertension    [name]  . Hypothyroidism   . IBS (irritable bowel syndrome)   . Intermittent vertigo   . 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 Leg cramping    "@ night" (09/18/2013)  . Migraine    "once q couple  months" (09/18/2013)  . Osteoporosis   . Peripheral neuropathy   . Type II diabetes mellitus ([place])   . Umbilical hernia    unrepaired (09/18/2013)    Past Surgical History:  Procedure Laterality Date  . CESAREAN SECTION  1989  . LAPAROSCOPIC ASSISTED VAGINAL HYSTERECTOMY  09/23/2012   Procedure: LAPAROSCOPIC ASSISTED VAGINAL HYSTERECTOMY;  Surgeon: Linda Hedges, DO;  Location: [facility];  Service: Gynecology;  Laterality: N/A;  pull Dr Gregor Hams instrument  . LEFT HEART CATH AND CORONARY ANGIOGRAPHY N/A 02/11/2017   Procedure: Left Heart Cath and Coronary Angiography;  Surgeon: Lorretta Harp, MD;  Location: [facility];  Service: Cardiovascular;  Laterality: N/A;  . TUBAL LIGATION Bilateral 1992     Current Outpatient Medications  Medication Sig Dispense Refill  . aspirin 81 MG chewable tablet Chew 1 tablet (81 mg total) by mouth daily. 30 tablet 0  . atorvastatin (LIPITOR) 80 MG tablet Take 1 tablet (80 mg total) by mouth daily at 6 PM. 90 tablet 3  . ezetimibe (ZETIA) 10 MG tablet Take 1 tablet (10 mg total) by mouth daily. 30 tablet 11  . Insulin Glargine (BASAGLAR KWIKPEN) 100 UNIT/ML SOPN Inject 0.22 mLs (22 Units total) into the skin at bedtime. 6.6 mL 0  .  insulin lispro (HUMALOG KWIKPEN) 100 UNIT/ML KiwkPen Inject into skin 3 times a day at meal time according to the following sliding scale: CBG 70-120 - 0 units / CBG 121-150 - 1 unit / CBG 151-200 - 2 units / CBG 201-250 - 3 units / CBG 251-300 - 5 units / CBG 301-350 - 7 units / CBG 351-400+ - 9 units  YOU MUST FOLLOW A STRICT DIABETIC DIET 15 mL 0  . Insulin Pen Needle (PEN NEEDLES) 31G X 8 MM MISC Use to inject insulin 4 times a day.  Dx: E11.10 130 each 11  . Lancets (ONETOUCH ULTRASOFT) lancets Use to check blood sugar daily as needed.  Dx: E11.10 100 each 3  . levothyroxine (SYNTHROID, LEVOTHROID) 150 MCG tablet TAKE 1 TABLET BY MOUTH EVERY DAY 30 tablet 0  . losartan (COZAAR) 25 MG tablet Take 25 mg by mouth  daily.  12  . metoCLOPramide (REGLAN) 5 MG tablet TAKE 1 TABLET BY MOUTH 4 TIMES A DAY BEFORE MEALS AND AT BEDTIME 120 tablet 1  . metoprolol succinate (TOPROL-XL) 25 MG 24 hr tablet Take 25 mg by mouth daily.  12  . nitroGLYCERIN (NITRODUR - DOSED IN MG/24 HR) 0.2 mg/hr patch Place 1 patch (0.2 mg total) onto the skin daily as needed. (Patient taking differently: Place 0.2 mg onto the skin daily. ) 10 patch 12  . ondansetron (ZOFRAN) 4 MG tablet TAKE 1 TABLET BY MOUTH EVERY 6 HOURS AS NEEDED FOR NAUSEA AND VOMITING (Patient not taking: Reported on 05/26/2018) 30 tablet 0  . pantoprazole (PROTONIX) 40 MG tablet Take 1 tablet (40 mg total) by mouth daily at 12 noon. 30 tablet 11  . potassium chloride SA (K-DUR,KLOR-CON) 20 MEQ tablet Take 1 tablet (20 mEq total) by mouth daily. 5 tablet 0  . promethazine (PHENERGAN) 25 MG suppository Place 1 suppository (25 mg total) rectally every 6 (six) hours as needed for nausea or vomiting. (Patient not taking: Reported on 05/26/2018) 12 each 0  . RELPAX 40 MG tablet ONE TABLET BY MOUTH AT ONSET OF HEADACHE. MAY REPEAT IN 2 HOURS IF HEADACHE PERSISTS OR RECURS. 10 tablet 2  . sertraline (ZOLOFT) 50 MG tablet Take 1 tablet (50 mg total) by mouth daily. 90 tablet 1  . Vitamin D, Ergocalciferol, (DRISDOL) 50000 units CAPS capsule TAKE 1 CAPSULE BY MOUTH EVERY 7 DAYS 12 capsule 0   No current facility-administered medications for this visit.     Allergies:   Codeine    ROS:  Please see the history of present illness.   Otherwise, review of systems are positive for ***.   All other systems are reviewed and negative.    PHYSICAL EXAM: VS:  LMP 08/11/2012  , BMI There is no height or weight on file to calculate BMI.  GENERAL:  Well appearing NECK:  No jugular venous distention, waveform within normal limits, carotid upstroke brisk and symmetric, no bruits, no thyromegaly LUNGS:  Clear to auscultation bilaterally CHEST:  Unremarkable HEART:  PMI not displaced or  sustained,S1 and S2 within normal limits, no S3, no S4, no clicks, no rubs, *** murmurs ABD:  Flat, positive bowel sounds normal in frequency in pitch, no bruits, no rebound, no guarding, no midline pulsatile mass, no hepatomegaly, no splenomegaly EXT:  2 plus pulses throughout, no edema, no cyanosis no clubbing   *** GENERAL:  Well appearing HEENT:  Pupils equal round and reactive, fundi not visualized, oral mucosa unremarkable, poor dentition  NECK:  No jugular venous  distention, waveform within normal limits, carotid upstroke brisk and symmetric, no bruits, no thyromegaly LYMPHATICS:  No cervical, inguinal adenopathy LUNGS:  Clear to auscultation bilaterally BACK:  No CVA tenderness CHEST:  Unremarkable HEART:  PMI not displaced or sustained,S1 and S2 within normal limits, no S3, no S4, no clicks, no rubs, no murmurs ABD:  Flat, positive bowel sounds normal in frequency in pitch, no bruits, no rebound, no guarding, no midline pulsatile mass, no hepatomegaly, no splenomegaly EXT:  2 plus pulses throughout, no edema, no cyanosis no clubbing SKIN:  No rashes no nodules NEURO:  Cranial nerves II through XII grossly intact, motor grossly intact throughout PSYCH:  Cognitively intact, oriented to person place and time    EKG:  EKG is *** ordered today. The ekg ordered today demonstrates sinus rhythm, rate ***, axis within normal limits, intervals within normal limits, no acute ST-T wave changes.   Recent Labs: 12/11/2017: TSH 0.016 02/02/2018: Magnesium 1.9 05/26/2018: ALT 16 05/27/2018: Hemoglobin 14.8; Platelets 304 05/28/2018: BUN 6; Creatinine, Ser 0.57; Potassium 2.9; Sodium 140    Lipid Panel    Component Value Date/Time   CHOL 256 (H) 12/12/2017 2000   TRIG 71 12/12/2017 2000   HDL 55 12/12/2017 2000   CHOLHDL 4.7 12/12/2017 2000   VLDL 14 12/12/2017 2000   LDLCALC 187 (H) 12/12/2017 2000   LDLDIRECT 206.0 07/27/2014 0745      Wt Readings from Last 3 Encounters:  05/26/18  135 lb 12.9 oz (61.6 kg)  04/25/18 138 lb (62.6 kg)  04/11/18 138 lb 6.4 oz (62.8 kg)      Other studies Reviewed: Additional studies/ records that were reviewed today include:*** Review of the above records demonstrates:  ***   ASSESSMENT AND PLAN:   Chronic systolic heart failure-   EF was actually better on the recent perfusion study.   ***  The patient seems to be euvolemic. Her blood pressure will not allow med titration. She'll continue the meds as listed.  Noncardiac chest pain -   ***  She continues to have chronic chest pain complaint non-cardiac. No further workup is planned.  HTN -  BP is ***  actually low and the rate limiting step for med titration.  No change in therapy.    Poorly controlled diabetes -      A1c was  16.7 a couple of days ago!!!  ***  Previous history of atrial fibrillation -   ***  She's had no symptomatic paroxysms of this. No change in therapy.   Current medicines are reviewed at length with the patient today.  The patient does not have concerns regarding medicines.  The following changes have been made:  ***  Labs/ tests ordered today include: ***  No orders of the defined types were placed in this encounter.    Disposition:   FU with ***     Signed, Minus Breeding, MD  05/29/2018 11:15 AM    [place]

## 2018-05-29 NOTE — Telephone Encounter (Signed)
Attempted to contact pt to complete TCM call; unable to leave vm

## 2018-05-30 ENCOUNTER — Ambulatory Visit: Payer: 59 | Admitting: Cardiology

## 2018-05-30 NOTE — Telephone Encounter (Signed)
Lm requesting return call to complete TCM and confirm hosp f/u appt  

## 2018-05-31 ENCOUNTER — Other Ambulatory Visit: Payer: Self-pay | Admitting: Family Medicine

## 2018-06-02 ENCOUNTER — Encounter: Payer: Self-pay | Admitting: *Deleted

## 2018-06-02 NOTE — Telephone Encounter (Signed)
Last office visit 03/13/2018.  Last TSH level 12/11/2017 which was abnormal.  Was scheduled for lab appointment to recheck thyroid labs on 05/19/2018 but no showed.  Has appointment scheduled with Dr. Diona Browner in 07/2018.  Refill?

## 2018-06-03 NOTE — Telephone Encounter (Signed)
Will re-check at upcoming [place].. will refill until then.

## 2018-06-19 ENCOUNTER — Other Ambulatory Visit: Payer: Self-pay | Admitting: Family Medicine

## 2018-06-27 ENCOUNTER — Encounter (HOSPITAL_COMMUNITY): Payer: Self-pay | Admitting: Internal Medicine

## 2018-06-27 ENCOUNTER — Observation Stay (HOSPITAL_COMMUNITY)
Admission: EM | Admit: 2018-06-27 | Discharge: 2018-06-29 | Disposition: A | Discharge status: Home / Self Care | Payer: 59 | Attending: Internal Medicine | Admitting: Internal Medicine

## 2018-06-27 DIAGNOSIS — E1010 Type 1 diabetes mellitus with ketoacidosis without coma: Secondary | ICD-10-CM | POA: Diagnosis not present

## 2018-06-27 DIAGNOSIS — D72829 Elevated white blood cell count, unspecified: Secondary | ICD-10-CM | POA: Insufficient documentation

## 2018-06-27 DIAGNOSIS — Z79890 Hormone replacement therapy (postmenopausal): Secondary | ICD-10-CM | POA: Insufficient documentation

## 2018-06-27 DIAGNOSIS — E559 Vitamin D deficiency, unspecified: Secondary | ICD-10-CM | POA: Diagnosis not present

## 2018-06-27 DIAGNOSIS — K589 Irritable bowel syndrome without diarrhea: Secondary | ICD-10-CM | POA: Diagnosis present

## 2018-06-27 DIAGNOSIS — E1065 Type 1 diabetes mellitus with hyperglycemia: Secondary | ICD-10-CM | POA: Diagnosis not present

## 2018-06-27 DIAGNOSIS — R079 Chest pain, unspecified: Secondary | ICD-10-CM | POA: Diagnosis not present

## 2018-06-27 DIAGNOSIS — K529 Noninfective gastroenteritis and colitis, unspecified: Secondary | ICD-10-CM | POA: Insufficient documentation

## 2018-06-27 DIAGNOSIS — Z79899 Other long term (current) drug therapy: Secondary | ICD-10-CM | POA: Insufficient documentation

## 2018-06-27 DIAGNOSIS — Z9119 Patient's noncompliance with other medical treatment and regimen: Secondary | ICD-10-CM | POA: Insufficient documentation

## 2018-06-27 DIAGNOSIS — I4891 Unspecified atrial fibrillation: Secondary | ICD-10-CM | POA: Diagnosis not present

## 2018-06-27 DIAGNOSIS — R197 Diarrhea, unspecified: Secondary | ICD-10-CM

## 2018-06-27 DIAGNOSIS — E1110 Type 2 diabetes mellitus with ketoacidosis without coma: Secondary | ICD-10-CM | POA: Diagnosis not present

## 2018-06-27 DIAGNOSIS — G8929 Other chronic pain: Secondary | ICD-10-CM | POA: Insufficient documentation

## 2018-06-27 DIAGNOSIS — R112 Nausea with vomiting, unspecified: Secondary | ICD-10-CM | POA: Diagnosis not present

## 2018-06-27 DIAGNOSIS — E785 Hyperlipidemia, unspecified: Secondary | ICD-10-CM | POA: Diagnosis present

## 2018-06-27 DIAGNOSIS — E1043 Type 1 diabetes mellitus with diabetic autonomic (poly)neuropathy: Secondary | ICD-10-CM | POA: Insufficient documentation

## 2018-06-27 DIAGNOSIS — E1022 Type 1 diabetes mellitus with diabetic chronic kidney disease: Secondary | ICD-10-CM | POA: Insufficient documentation

## 2018-06-27 DIAGNOSIS — Z90710 Acquired absence of both cervix and uterus: Secondary | ICD-10-CM | POA: Insufficient documentation

## 2018-06-27 DIAGNOSIS — K580 Irritable bowel syndrome with diarrhea: Secondary | ICD-10-CM | POA: Diagnosis not present

## 2018-06-27 DIAGNOSIS — Z794 Long term (current) use of insulin: Secondary | ICD-10-CM | POA: Insufficient documentation

## 2018-06-27 DIAGNOSIS — E876 Hypokalemia: Secondary | ICD-10-CM | POA: Diagnosis not present

## 2018-06-27 DIAGNOSIS — I429 Cardiomyopathy, unspecified: Secondary | ICD-10-CM

## 2018-06-27 DIAGNOSIS — I252 Old myocardial infarction: Secondary | ICD-10-CM | POA: Diagnosis not present

## 2018-06-27 DIAGNOSIS — E1149 Type 2 diabetes mellitus with other diabetic neurological complication: Secondary | ICD-10-CM

## 2018-06-27 DIAGNOSIS — E1165 Type 2 diabetes mellitus with hyperglycemia: Secondary | ICD-10-CM

## 2018-06-27 DIAGNOSIS — I428 Other cardiomyopathies: Secondary | ICD-10-CM | POA: Diagnosis not present

## 2018-06-27 DIAGNOSIS — G629 Polyneuropathy, unspecified: Secondary | ICD-10-CM

## 2018-06-27 DIAGNOSIS — E1142 Type 2 diabetes mellitus with diabetic polyneuropathy: Secondary | ICD-10-CM

## 2018-06-27 DIAGNOSIS — E039 Hypothyroidism, unspecified: Secondary | ICD-10-CM | POA: Diagnosis present

## 2018-06-27 DIAGNOSIS — Z7982 Long term (current) use of aspirin: Secondary | ICD-10-CM | POA: Insufficient documentation

## 2018-06-27 DIAGNOSIS — I10 Essential (primary) hypertension: Secondary | ICD-10-CM

## 2018-06-27 DIAGNOSIS — I5022 Chronic systolic (congestive) heart failure: Secondary | ICD-10-CM | POA: Insufficient documentation

## 2018-06-27 DIAGNOSIS — M810 Age-related osteoporosis without current pathological fracture: Secondary | ICD-10-CM | POA: Diagnosis not present

## 2018-06-27 DIAGNOSIS — Z87891 Personal history of nicotine dependence: Secondary | ICD-10-CM | POA: Insufficient documentation

## 2018-06-27 DIAGNOSIS — F411 Generalized anxiety disorder: Secondary | ICD-10-CM | POA: Diagnosis not present

## 2018-06-27 DIAGNOSIS — N182 Chronic kidney disease, stage 2 (mild): Secondary | ICD-10-CM | POA: Diagnosis present

## 2018-06-27 DIAGNOSIS — I130 Hypertensive heart and chronic kidney disease with heart failure and stage 1 through stage 4 chronic kidney disease, or unspecified chronic kidney disease: Secondary | ICD-10-CM | POA: Insufficient documentation

## 2018-06-27 DIAGNOSIS — Z885 Allergy status to narcotic agent status: Secondary | ICD-10-CM | POA: Insufficient documentation

## 2018-06-27 DIAGNOSIS — K219 Gastro-esophageal reflux disease without esophagitis: Secondary | ICD-10-CM | POA: Diagnosis not present

## 2018-06-27 DIAGNOSIS — E1143 Type 2 diabetes mellitus with diabetic autonomic (poly)neuropathy: Secondary | ICD-10-CM | POA: Diagnosis present

## 2018-06-27 DIAGNOSIS — E1042 Type 1 diabetes mellitus with diabetic polyneuropathy: Secondary | ICD-10-CM | POA: Insufficient documentation

## 2018-06-27 DIAGNOSIS — K3184 Gastroparesis: Secondary | ICD-10-CM | POA: Diagnosis not present

## 2018-06-27 DIAGNOSIS — Z91199 Patient's noncompliance with other medical treatment and regimen due to unspecified reason: Secondary | ICD-10-CM

## 2018-06-27 HISTORY — DX: Heart failure, unspecified: I50.9

## 2018-06-27 LAB — BASIC METABOLIC PANEL
Anion gap: 20 — ABNORMAL HIGH (ref 5–15)
Anion gap: 15 (ref 5–15)
Anion gap: 10 (ref 5–15)
BUN: 12 mg/dL (ref 6–20)
BUN: 11 mg/dL (ref 6–20)
BUN: 13 mg/dL (ref 6–20)
CALCIUM: 8.9 mg/dL (ref 8.9–10.3)
CALCIUM: 8.7 mg/dL — AB (ref 8.9–10.3)
CALCIUM: 8.6 mg/dL — AB (ref 8.9–10.3)
CHLORIDE: 99 mmol/L (ref 98–111)
CHLORIDE: 102 mmol/L (ref 98–111)
CHLORIDE: 103 mmol/L (ref 98–111)
CO2: 17 mmol/L — ABNORMAL LOW (ref 22–32)
CO2: 21 mmol/L — ABNORMAL LOW (ref 22–32)
CO2: 23 mmol/L (ref 22–32)
CREATININE: 0.88 mg/dL (ref 0.44–1.00)
CREATININE: 0.8 mg/dL (ref 0.44–1.00)
CREATININE: 0.74 mg/dL (ref 0.44–1.00)
GFR calc non Af Amer: >60 mL/min (ref >60)
GFR calc non Af Amer: >60 mL/min (ref >60)
GFR calc non Af Amer: >60 mL/min (ref >60)
Glucose, Bld: 520 mg/dL (ref 70–99)
Glucose, Bld: 390 mg/dL — ABNORMAL HIGH (ref 70–99)
Glucose, Bld: 288 mg/dL — ABNORMAL HIGH (ref 70–99)
Potassium: 4 mmol/L (ref 3.5–5.1)
Potassium: 3.7 mmol/L (ref 3.5–5.1)
Potassium: 3.8 mmol/L (ref 3.5–5.1)
SODIUM: 138 mmol/L (ref 135–145)
Sodium: 136 mmol/L (ref 135–145)
Sodium: 136 mmol/L (ref 135–145)

## 2018-06-27 LAB — CBC
HCT: 48.3 % — ABNORMAL HIGH (ref 36.0–46.0)
Hemoglobin: 16 g/dL — ABNORMAL HIGH (ref 12.0–15.0)
MCH: 28.4 pg (ref 26.0–34.0)
MCHC: 33.1 g/dL (ref 30.0–36.0)
MCV: 85.6 fL (ref 78.0–100.0)
PLATELETS: 374 10*3/uL (ref 150–400)
RBC: 5.64 MIL/uL — AB (ref 3.87–5.11)
RDW: 15 % (ref 11.5–15.5)
WBC: 16.3 10*3/uL — ABNORMAL HIGH (ref 4.0–10.5)

## 2018-06-27 LAB — GLUCOSE, CAPILLARY
GLUCOSE-CAPILLARY: 344 mg/dL — AB (ref 70–99)
GLUCOSE-CAPILLARY: 311 mg/dL — AB (ref 70–99)
GLUCOSE-CAPILLARY: 239 mg/dL — AB (ref 70–99)
GLUCOSE-CAPILLARY: 160 mg/dL — AB (ref 70–99)
Glucose-Capillary: 143 mg/dL — ABNORMAL HIGH (ref 70–99)
Glucose-Capillary: 262 mg/dL — ABNORMAL HIGH (ref 70–99)
Glucose-Capillary: 349 mg/dL — ABNORMAL HIGH (ref 70–99)

## 2018-06-27 LAB — CBG MONITORING, ED
GLUCOSE-CAPILLARY: 505 mg/dL — AB (ref 70–99)
GLUCOSE-CAPILLARY: 424 mg/dL — AB (ref 70–99)

## 2018-06-27 LAB — I-STAT VENOUS BLOOD GAS, ED
Acid-base deficit: 6 mmol/L — ABNORMAL HIGH (ref 0.0–2.0)
Bicarbonate: 20.6 mmol/L (ref 20.0–28.0)
O2 Saturation: 53 %
TCO2: 22 mmol/L (ref 22–32)
pCO2, Ven: 44.6 mmHg (ref 44.0–60.0)
pH, Ven: 7.273 (ref 7.250–7.430)
pO2, Ven: 32 mmHg (ref 32.0–45.0)

## 2018-06-27 LAB — TROPONIN I: Troponin I: <0.03 ng/mL (ref <0.03)

## 2018-06-27 LAB — MRSA PCR SCREENING: MRSA by PCR: NEGATIVE

## 2018-06-27 LAB — I-STAT BETA HCG BLOOD, ED (MC, WL, AP ONLY): I-stat hCG, quantitative: <5 m[IU]/mL (ref <5)

## 2018-06-27 MED ORDER — SODIUM CHLORIDE 0.9 % IV SOLN
INTRAVENOUS | Status: DC
  Administered 2018-06-27: 18:00:00 via INTRAVENOUS

## 2018-06-27 MED ORDER — DEXTROSE-NACL 5-0.45 % IV SOLN
INTRAVENOUS | Status: DC
  Administered 2018-06-27: 18:00:00 via INTRAVENOUS

## 2018-06-27 MED ORDER — ENOXAPARIN SODIUM 40 MG/0.4ML ~~LOC~~ SOLN
40.0000 mg | SUBCUTANEOUS | Status: DC
  Administered 2018-06-27 – 2018-06-28 (×2): 40 mg via SUBCUTANEOUS
  Filled 2018-06-27 (×2): qty 0.4

## 2018-06-27 MED ORDER — EZETIMIBE 10 MG PO TABS
10.0000 mg | ORAL_TABLET | Freq: Every day | ORAL | Status: DC
  Administered 2018-06-28 – 2018-06-29 (×2): 10 mg via ORAL
  Filled 2018-06-27 (×2): qty 1

## 2018-06-27 MED ORDER — METOPROLOL SUCCINATE ER 25 MG PO TB24
25.0000 mg | ORAL_TABLET | Freq: Every day | ORAL | Status: DC
  Administered 2018-06-27 – 2018-06-29 (×3): 25 mg via ORAL
  Filled 2018-06-27 (×4): qty 1

## 2018-06-27 MED ORDER — POTASSIUM CHLORIDE 10 MEQ/100ML IV SOLN
10.0000 meq | INTRAVENOUS | Status: AC
  Administered 2018-06-27 (×2): 10 meq via INTRAVENOUS
  Filled 2018-06-27 (×2): qty 100

## 2018-06-27 MED ORDER — SODIUM CHLORIDE 0.9 % IV BOLUS
500.0000 mL | Freq: Once | INTRAVENOUS | Status: AC
  Administered 2018-06-27: 500 mL via INTRAVENOUS

## 2018-06-27 MED ORDER — INSULIN ASPART 100 UNIT/ML ~~LOC~~ SOLN
6.0000 [IU] | Freq: Once | SUBCUTANEOUS | Status: AC
  Administered 2018-06-27: 6 [IU] via INTRAVENOUS
  Filled 2018-06-27: qty 1

## 2018-06-27 MED ORDER — ATORVASTATIN CALCIUM 20 MG PO TABS
80.0000 mg | ORAL_TABLET | Freq: Every day | ORAL | Status: DC
  Administered 2018-06-27 – 2018-06-28 (×2): 80 mg via ORAL
  Filled 2018-06-27 (×2): qty 4

## 2018-06-27 MED ORDER — SODIUM CHLORIDE 0.9 % IV SOLN
INTRAVENOUS | Status: DC
  Administered 2018-06-27: 3.6 [IU]/h via INTRAVENOUS
  Filled 2018-06-27 (×2): qty 1

## 2018-06-27 MED ORDER — LEVOTHYROXINE SODIUM 150 MCG PO TABS
150.0000 ug | ORAL_TABLET | Freq: Every day | ORAL | Status: DC
  Administered 2018-06-28 – 2018-06-29 (×2): 150 ug via ORAL
  Filled 2018-06-27: qty 2
  Filled 2018-06-27: qty 1
  Filled 2018-06-27: qty 2
  Filled 2018-06-27: qty 1

## 2018-06-27 MED ORDER — ASPIRIN 81 MG PO CHEW
81.0000 mg | CHEWABLE_TABLET | Freq: Every day | ORAL | Status: DC
  Administered 2018-06-28 – 2018-06-29 (×2): 81 mg via ORAL
  Filled 2018-06-27 (×2): qty 1

## 2018-06-27 MED ORDER — DEXTROSE-NACL 5-0.45 % IV SOLN: INTRAVENOUS | Status: DC

## 2018-06-27 MED ORDER — PROMETHAZINE HCL 25 MG/ML IJ SOLN
25.0000 mg | Freq: Once | INTRAMUSCULAR | Status: AC
  Administered 2018-06-27: 25 mg via INTRAVENOUS
  Filled 2018-06-27: qty 1

## 2018-06-27 NOTE — ED Notes (Signed)
CBG- 505 

## 2018-06-27 NOTE — H&P (Signed)
History and Physical    [name] [name] VEH:[id number] DOB: [birth year] DOA: 06/27/2018  I have briefly reviewed the patient's prior medical records in [place]  PCP: Jinny Sanders, MD  Patient coming from: home  Chief Complaint: nausea/vomiting   HPI: [name] [name] is a 57 y.o. female with medical history significant of chronic systolic CHF, nonischemic cardiomyopathy, diabetes mellitus, who presents to the hospital with chief complaint of nausea vomiting and high sugars.  Patient tells me that she was in her normal state of health, on Wednesday evening she went and have dinner at her church, and did not feel right immediately afterwards.  She reported some abdominal cramping.  This was followed by profuse diarrhea the next day, and continued to feel weak.  She states that her sugars yesterday were in the 200s, and she did not miss her insulin.  When she woke up this morning she had epigastric abdominal pain, nausea vomiting and her sugar was significantly elevated and patient decided come to the hospital.  She denies any fever or chills.  She states that her diarrhea is slowing down today.  She also reports intermittent chest pains, on and off for the past year.  She has been evaluated by cardiology in the past for this, including a cardiac catheterization in 2018 which did not show significant coronary artery disease.  ED Course: She is afebrile, pressures on the high side, BMP was pertinent for bicarb of 17, glucose 520 as well as an anion gap elevated 20.  She has a leukocytosis with a white count of 16.  She was placed on insulin infusion and we are asked to admit  Review of Systems: As per HPI otherwise 10 point review of systems negative.   Past Medical History:  Diagnosis Date  . Anxiety   . B12 deficiency   . Chest pain 04/05/2017  . CHF (congestive heart failure) ([place])   . DKA (diabetic ketoacidoses) ([place]) 05/25/2018  . GERD (gastroesophageal reflux disease)   .  Hyperlipidemia   . Hypertension    [name]  . Hypothyroidism   . IBS (irritable bowel syndrome)   . Intermittent vertigo   . 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 Leg cramping    "@ night" (09/18/2013)  . Migraine    "once q couple months" (09/18/2013)  . Osteoporosis   . Peripheral neuropathy   . Type II diabetes mellitus ([place])   . Umbilical hernia    unrepaired (09/18/2013)    Past Surgical History:  Procedure Laterality Date  . CESAREAN SECTION  1989  . LAPAROSCOPIC ASSISTED VAGINAL HYSTERECTOMY  09/23/2012   Procedure: LAPAROSCOPIC ASSISTED VAGINAL HYSTERECTOMY;  Surgeon: Linda Hedges, DO;  Location: [facility];  Service: Gynecology;  Laterality: N/A;  pull Dr Gregor Hams instrument  . LEFT HEART CATH AND CORONARY ANGIOGRAPHY N/A 02/11/2017   Procedure: Left Heart Cath and Coronary Angiography;  Surgeon: Lorretta Harp, MD;  Location: [facility];  Service: Cardiovascular;  Laterality: N/A;  . TUBAL LIGATION Bilateral 1992     reports that she quit smoking about 27 years ago. Her smoking use included cigarettes. She quit after 0.20 years of use. She has never used smokeless tobacco. She reports that she has current or past drug history. Drug: Marijuana. Frequency: 3.00 times per week. She reports that she does not drink alcohol.  Allergies  Allergen Reactions  . Codeine Hives, Anxiety and Other (See Comments)    Family History  Problem Relation Age of Onset  . Diabetes Other   . Heart disease Other   . Heart failure Mother 66       Her heart skipped a beat and stopped - per pt  . Diabetes Maternal Grandmother   . Alzheimer's disease Maternal Grandmother   . Coronary artery disease Maternal Grandmother   . Heart attack Maternal Grandfather   . Heart failure Brother     Prior to Admission medications   Medication Sig Start Date End Date Taking? Authorizing Provider  aspirin 81 MG chewable tablet Chew 1 tablet (81 mg total) by mouth daily.  02/12/17  Yes Jani Gravel, MD  atorvastatin (LIPITOR) 80 MG tablet Take 1 tablet (80 mg total) by mouth daily at 6 PM. 03/13/18  Yes Bedsole, Amy E, MD  ezetimibe (ZETIA) 10 MG tablet Take 1 tablet (10 mg total) by mouth daily. 09/11/16  Yes Bedsole, Amy E, MD  Insulin Glargine (BASAGLAR KWIKPEN) 100 UNIT/ML SOPN Inject 0.22 mLs (22 Units total) into the skin at bedtime. 05/28/18 06/27/18 Yes Adhikari, Tamsen Meek, MD  insulin lispro (HUMALOG KWIKPEN) 100 UNIT/ML KiwkPen Inject into skin 3 times a day at meal time according to the following sliding scale: CBG 70-120 - 0 units / CBG 121-150 - 1 unit / CBG 151-200 - 2 units / CBG 201-250 - 3 units / CBG 251-300 - 5 units / CBG 301-350 - 7 units / CBG 351-400+ - 9 units  YOU MUST FOLLOW A STRICT DIABETIC DIET 05/28/18  Yes Adhikari, Tamsen Meek, MD  levothyroxine (SYNTHROID, LEVOTHROID) 150 MCG tablet TAKE 1 TABLET BY MOUTH EVERY DAY 06/03/18  Yes Bedsole, Amy E, MD  losartan (COZAAR) 25 MG tablet Take 25 mg by mouth daily. 05/27/17  Yes [provider]  metoCLOPramide (REGLAN) 5 MG tablet TAKE 1 TABLET BY MOUTH 4 TIMES A DAY BEFORE MEALS AND AT BEDTIME 05/09/18  Yes Bedsole, Amy E, MD  metoprolol succinate (TOPROL-XL) 25 MG 24 hr tablet Take 25 mg by mouth daily. 03/24/17  Yes [provider]  nitroGLYCERIN (NITRODUR - DOSED IN MG/24 HR) 0.2 mg/hr patch Place 1 patch (0.2 mg total) onto the skin daily as needed. Patient taking differently: Place 0.2 mg onto the skin daily.  12/14/17  Yes Regalado, Belkys A, MD  pantoprazole (PROTONIX) 40 MG tablet Take 1 tablet (40 mg total) by mouth daily at 12 noon. 01/28/18  Yes Bedsole, Amy E, MD  potassium chloride SA (K-DUR,KLOR-CON) 20 MEQ tablet Take 1 tablet (20 mEq total) by mouth daily. 05/29/18  Yes Adhikari, Tamsen Meek, MD  RELPAX 40 MG tablet ONE TABLET BY MOUTH AT ONSET OF HEADACHE. MAY REPEAT IN 2 HOURS IF HEADACHE PERSISTS OR RECURS. 10/17/16  Yes Bedsole, Amy E, MD  sertraline (ZOLOFT) 50 MG tablet TAKE 1  TABLET BY MOUTH EVERY DAY 06/19/18  Yes Bedsole, Amy E, MD  Vitamin D, Ergocalciferol, (DRISDOL) 50000 units CAPS capsule TAKE 1 CAPSULE BY MOUTH EVERY 7 DAYS Patient taking differently: TAKE 1 CAPSULE BY MOUTH EVERY 7 DAYS Wednesdays 04/22/18  Yes Bedsole, Amy E, MD  Insulin Pen Needle (PEN NEEDLES) 31G X 8 MM MISC Use to inject insulin 4 times a day.  Dx: E11.10 12/23/17   Jinny Sanders, MD  Lancets (ONETOUCH ULTRASOFT) lancets Use to check blood sugar daily as needed.  Dx: E11.10 12/23/17   Bedsole, Amy E, MD  ondansetron (ZOFRAN) 4 MG tablet TAKE 1 TABLET BY MOUTH EVERY 6 HOURS AS NEEDED FOR NAUSEA AND VOMITING Patient not  taking: Reported on 05/26/2018 04/15/18   Jinny Sanders, MD  promethazine (PHENERGAN) 25 MG suppository Place 1 suppository (25 mg total) rectally every 6 (six) hours as needed for nausea or vomiting. Patient not taking: Reported on 05/26/2018 12/26/17   Macarthur Critchley, MD    Physical Exam: Vitals:   06/29/18 1430 June 29, 2018 1445 29-Jun-2018 1500 2018-06-29 1515  BP: (!) 153/105 100/72 92/71 111/70  Pulse: (!) 103 (!) 104 (!) 103 92  Resp: (!) 27 (!) 22 20 (!) 21  Temp:      TempSrc:      SpO2: 99% 95% 96% 98%  Weight:      Height:        Constitutional: NA Eyes: PERRL, lids and conjunctivae normal ENMT: Mucous membranes are moist.  Neck: normal, supple Respiratory: clear to auscultation bilaterally, no wheezing, no crackles. Normal respiratory effort. No accessory muscle use.  Cardiovascular: Regular rate and rhythm, no murmurs / rubs / gallops. No extremity edema. 2+ pedal pulses.  Abdomen: no tenderness, no masses palpated. Bowel sounds positive.  Musculoskeletal: no clubbing / cyanosis. Normal muscle tone.  Skin: no rashes, lesions, ulcers. No induration Neurologic:non focal  Psychiatric: Normal judgment and insight. Alert and oriented x 3. Normal mood.   Labs on Admission: I have personally reviewed following labs and imaging studies  CBC: Recent Labs    Lab 06/29/2018 1301  WBC 16.3*  HGB 16.0*  HCT 48.3*  MCV 85.6  PLT 086   Basic Metabolic Panel: Recent Labs  Lab 29-Jun-2018 1301  NA 136  K 4.0  CL 99  CO2 17*  GLUCOSE 520*  BUN 12  CREATININE 0.88  CALCIUM 8.9   GFR: Estimated Creatinine Clearance: 59.4 mL/min (by C-G formula based on SCr of 0.88 mg/dL). Liver Function Tests: No results for input(s): AST, ALT, ALKPHOS, BILITOT, PROT, ALBUMIN in the last 168 hours. No results for input(s): LIPASE, AMYLASE in the last 168 hours. No results for input(s): AMMONIA in the last 168 hours. Coagulation Profile: No results for input(s): INR, PROTIME in the last 168 hours. Cardiac Enzymes: No results for input(s): CKTOTAL, CKMB, CKMBINDEX, TROPONINI in the last 168 hours. BNP (last 3 results) No results for input(s): PROBNP in the last 8760 hours. HbA1C: No results for input(s): HGBA1C in the last 72 hours. CBG: Recent Labs  Lab 06/29/2018 1245  GLUCAP 505*   Lipid Profile: No results for input(s): CHOL, HDL, LDLCALC, TRIG, CHOLHDL, LDLDIRECT in the last 72 hours. Thyroid Function Tests: No results for input(s): TSH, T4TOTAL, FREET4, T3FREE, THYROIDAB in the last 72 hours. Anemia Panel: No results for input(s): VITAMINB12, FOLATE, FERRITIN, TIBC, IRON, RETICCTPCT in the last 72 hours. Urine analysis:    Component Value Date/Time   COLORURINE STRAW (A) 05/26/2018 0129   APPEARANCEUR CLEAR 05/26/2018 0129   LABSPEC 1.027 05/26/2018 0129   PHURINE 5.0 05/26/2018 0129   GLUCOSEU >=500 (A) 05/26/2018 0129   HGBUR SMALL (A) 05/26/2018 0129   BILIRUBINUR NEGATIVE 05/26/2018 0129   BILIRUBINUR negative 09/11/2016 1038   KETONESUR 80 (A) 05/26/2018 0129   PROTEINUR 100 (A) 05/26/2018 0129   UROBILINOGEN 0.2 09/11/2016 1038   UROBILINOGEN 0.2 10/28/2013 1723   NITRITE NEGATIVE 05/26/2018 0129   LEUKOCYTESUR NEGATIVE 05/26/2018 0129     Radiological Exams on Admission: No results found.  EKG: Independently reviewed.    Assessment/Plan Active Problems:   DKA (diabetic ketoacidoses) ([place])   DKA with nausea / vomiting / diabetes mellitus -Place patient on insulin infusion as  well as fluids, admit to stepdown -Keep n.p.o., once gap is closing and bicarb is improving will place on Lantus and sliding scale and allow diet -Possibly in DKA due to recent GI illness  Diarrheal illness -Appears after her dinner discharged on Wednesday, seems to be improving on its own.  If she has persistent diarrhea while hospitalized may need to have infectious work-up but for now she appears to be improving on her own -May be the cause is as why she is in DKA right now  Chest pain/nonischemic cardiomyopathy/chronic systolic CHF -She appears on the dry side now, give IV fluids as per DKA protocol and closely monitor for fluid overload -Given chest pains, cycle cardiac enzymes -Continue aspirin  Leukocytosis -May be related to DKA versus GI illness, also her urinalysis pending  Hyperlipidemia -Continue Lipitor  Hypothyroidism -Continue Synthroid  Hypertension -Hold ARB due to some readings of salt blood pressure, resume tomorrow if blood pressure stable -Continue beta-blockers with metoprolol   DVT prophylaxis: Lovenox  Code Status: Full code  Family Communication: no family at bedside Disposition Plan: admit to SDU Consults called: none      Admission status: observation   At the point of initial evaluation, it is my clinical opinion that admission for OBSERVATION is reasonable and necessary because the patient's presenting complaints in the context of their chronic conditions represent sufficient risk of deterioration or significant morbidity to constitute reasonable grounds for close observation in the hospital setting, but that the patient may be medically stable for discharge from the hospital within 24 to 48 hours.    Marzetta Board, MD [facility] Pager [phone]  If 7PM-7AM, please  contact night-coverage [email or web address] Password [facility]  06/27/2018, 3:45 PM

## 2018-06-27 NOTE — ED Triage Notes (Signed)
Pt here from home with hyperglycemia, N/V/D since 0500 today. Reports 10 episodes of emesis today and 6 episodes of diarrhea. CBG 480 for EMS. Given 4mg  zofran and 250 bolus NS by EMS. Symptoms relieved by zofran administration. Vital signs stable for EMS. Hx type 2 diabetes, CHF.

## 2018-06-27 NOTE — ED Notes (Signed)
Irena Cords PA-C made aware of pt's critically high glucose value.

## 2018-06-27 NOTE — ED Provider Notes (Signed)
[facility] EMERGENCY DEPARTMENT Provider Note   CSN: [id number] Arrival date & time: 06/27/18  1235     History   Chief Complaint Chief Complaint  Patient presents with  . Hyperglycemia  . Nausea  . Emesis    HPI [name] [name] is a 57 y.o. female.  HPI Patient presents to the emergency department with elevated blood sugars along with nausea and vomiting.  Patient states she started feeling bad this morning.  Patient states that she is been taking her medicines as prescribed.  The patient denies chest pain, shortness of breath, headache,blurred vision, neck pain, fever, cough, weakness, numbness, dizziness, anorexia, edema, abdominal pain,  diarrhea, rash, back pain, dysuria, hematemesis, bloody stool, near syncope, or syncope. Past Medical History:  Diagnosis Date  . Anxiety   . B12 deficiency   . Chest pain 04/05/2017  . CHF (congestive heart failure) ([place])   . DKA (diabetic ketoacidoses) ([place]) 05/25/2018  . GERD (gastroesophageal reflux disease)   . Hyperlipidemia   . Hypertension    [name]  . Hypothyroidism   . IBS (irritable bowel syndrome)   . Intermittent vertigo   . 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 Leg cramping    "@ night" (09/18/2013)  . Migraine    "once q couple months" (09/18/2013)  . Osteoporosis   . Peripheral neuropathy   . Type II diabetes mellitus ([place])   . Umbilical hernia    unrepaired (09/18/2013)    Patient Active Problem List   Diagnosis Date Noted  . ARF (acute renal failure) ([place]) 05/26/2018  . Colitis 03/07/2018  . Chronic chest pain   . Chronic systolic heart failure ([place]) 01/14/2018  . Noncompliance 01/14/2018  . Migraines 12/11/2017  . DKA (diabetic ketoacidoses) ([place]) 12/11/2017  . Chest discomfort 09/18/2017  . DKA, type 1 ([place]) 09/17/2017  . Phrygian cap 06/20/2017  . Lower abdominal pain   . Colitis presumed infectious   . Nonischemic cardiomyopathy ([place]) 03/22/2017  .  NSTEMI (non-ST elevated myocardial infarction) ([place]) 02/11/2017  . H. pylori infection 02/09/2017  . Hypomagnesemia 02/09/2017  . Gastroparesis due to DM ([place]) 01/22/2017  . AKI (acute kidney injury) ([place]) 07/24/2016  . Type 2 diabetes mellitus with ketoacidosis without coma, with long-term current use of insulin ([place]) 07/17/2016  . Atrial fibrillation ([place]) 02/01/2015  . 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 Hepatic lesion 09/18/2013  . Vitamin D deficiency 05/20/2012  . Essential hypertension 02/01/2011  . Osteoporosis 02/01/2011  . B12 deficiency 04/04/2010  . Peripheral neuropathy ([place]) 05/10/2009  . GERD 04/08/2009  . Generalized anxiety disorder 06/22/2008  . Hypothyroidism 04/29/2008  . Hyperlipidemia 04/29/2008  . Irritable bowel syndrome 04/29/2008    Past Surgical History:  Procedure Laterality Date  . CESAREAN SECTION  1989  . LAPAROSCOPIC ASSISTED VAGINAL HYSTERECTOMY  09/23/2012   Procedure: LAPAROSCOPIC ASSISTED VAGINAL HYSTERECTOMY;  Surgeon: Linda Hedges, DO;  Location: [facility];  Service: Gynecology;  Laterality: N/A;  pull Dr Gregor Hams instrument  . LEFT HEART CATH AND CORONARY ANGIOGRAPHY N/A 02/11/2017   Procedure: Left Heart Cath and Coronary Angiography;  Surgeon: Lorretta Harp, MD;  Location: [facility];  Service: Cardiovascular;  Laterality: N/A;  . TUBAL LIGATION Bilateral 1992     OB History    Gravida  2   Para  2   Term      Preterm      AB  Living        SAB      TAB      Ectopic      Multiple      Live Births               Home Medications    Prior to Admission medications   Medication Sig Start Date End Date Taking? Authorizing Provider  aspirin 81 MG chewable tablet Chew 1 tablet (81 mg total) by mouth daily. 02/12/17   Jani Gravel, MD  atorvastatin (LIPITOR) 80 MG tablet Take 1 tablet (80 mg total) by mouth daily at 6 PM. 03/13/18   Bedsole, Amy E, MD  ezetimibe (ZETIA) 10 MG tablet  Take 1 tablet (10 mg total) by mouth daily. 09/11/16   Bedsole, Amy E, MD  Insulin Glargine (BASAGLAR KWIKPEN) 100 UNIT/ML SOPN Inject 0.22 mLs (22 Units total) into the skin at bedtime. 05/28/18 06/27/18  Shelly Coss, MD  insulin lispro (HUMALOG KWIKPEN) 100 UNIT/ML KiwkPen Inject into skin 3 times a day at meal time according to the following sliding scale: CBG 70-120 - 0 units / CBG 121-150 - 1 unit / CBG 151-200 - 2 units / CBG 201-250 - 3 units / CBG 251-300 - 5 units / CBG 301-350 - 7 units / CBG 351-400+ - 9 units  YOU MUST FOLLOW A STRICT DIABETIC DIET 05/28/18   Shelly Coss, MD  Insulin Pen Needle (PEN NEEDLES) 31G X 8 MM MISC Use to inject insulin 4 times a day.  Dx: E11.10 12/23/17   Jinny Sanders, MD  Lancets (ONETOUCH ULTRASOFT) lancets Use to check blood sugar daily as needed.  Dx: E11.10 12/23/17   Bedsole, Amy E, MD  levothyroxine (SYNTHROID, LEVOTHROID) 150 MCG tablet TAKE 1 TABLET BY MOUTH EVERY DAY 06/03/18   Bedsole, Amy E, MD  losartan (COZAAR) 25 MG tablet Take 25 mg by mouth daily. 05/27/17   [provider]  metoCLOPramide (REGLAN) 5 MG tablet TAKE 1 TABLET BY MOUTH 4 TIMES A DAY BEFORE MEALS AND AT BEDTIME 05/09/18   Bedsole, Amy E, MD  metoprolol succinate (TOPROL-XL) 25 MG 24 hr tablet Take 25 mg by mouth daily. 03/24/17   [provider]  nitroGLYCERIN (NITRODUR - DOSED IN MG/24 HR) 0.2 mg/hr patch Place 1 patch (0.2 mg total) onto the skin daily as needed. Patient taking differently: Place 0.2 mg onto the skin daily.  12/14/17   Regalado, Belkys A, MD  ondansetron (ZOFRAN) 4 MG tablet TAKE 1 TABLET BY MOUTH EVERY 6 HOURS AS NEEDED FOR NAUSEA AND VOMITING Patient not taking: Reported on 05/26/2018 04/15/18   Jinny Sanders, MD  pantoprazole (PROTONIX) 40 MG tablet Take 1 tablet (40 mg total) by mouth daily at 12 noon. 01/28/18   Bedsole, Amy E, MD  potassium chloride SA (K-DUR,KLOR-CON) 20 MEQ tablet Take 1 tablet (20 mEq total) by mouth daily. 05/29/18    Shelly Coss, MD  promethazine (PHENERGAN) 25 MG suppository Place 1 suppository (25 mg total) rectally every 6 (six) hours as needed for nausea or vomiting. Patient not taking: Reported on 05/26/2018 12/26/17   Mackuen, Courteney Lyn, MD  RELPAX 40 MG tablet ONE TABLET BY MOUTH AT ONSET OF HEADACHE. MAY REPEAT IN 2 HOURS IF HEADACHE PERSISTS OR RECURS. 10/17/16   Bedsole, Amy E, MD  sertraline (ZOLOFT) 50 MG tablet TAKE 1 TABLET BY MOUTH EVERY DAY 06/19/18   Bedsole, Amy E, MD  Vitamin D, Ergocalciferol, (DRISDOL) 50000 units CAPS capsule TAKE 1  CAPSULE BY MOUTH EVERY 7 DAYS 04/22/18   Jinny Sanders, MD    Family History Family History  Problem Relation Age of Onset  . Diabetes Other   . Heart disease Other   . Heart failure Mother 79       Her heart skipped a beat and stopped - per pt  . Diabetes Maternal Grandmother   . Alzheimer's disease Maternal Grandmother   . Coronary artery disease Maternal Grandmother   . Heart attack Maternal Grandfather   . Heart failure Brother     Social History Social History   Tobacco Use  . Smoking status: Former Smoker    Years: 0.20    Types: Cigarettes    Last attempt to quit: 12/10/1990    Years since quitting: 27.5  . Smokeless tobacco: Never Used  Substance Use Topics  . Alcohol use: No    Alcohol/week: 0.0 oz  . Drug use: Yes    Frequency: 3.0 times per week    Types: Marijuana     Allergies   Codeine   Review of Systems Review of Systems All other systems negative except as documented in the HPI. All pertinent positives and negatives as reviewed in the HPI.  Physical Exam Updated Vital Signs BP 111/70   Pulse 92   Temp 98.1 F (36.7 C) (Oral)   Resp (!) 21   Ht 4\' 11"  (1.499 m)   Wt 64 kg (141 lb)   LMP 08/11/2012   SpO2 98%   BMI 28.48 kg/m   Physical Exam  Constitutional: She is oriented to person, place, and time. She appears well-developed and well-nourished. No distress.  HENT:  Head: Normocephalic and  atraumatic.  Mouth/Throat: Oropharynx is clear and moist.  Eyes: Pupils are equal, round, and reactive to light.  Neck: Normal range of motion. Neck supple.  Cardiovascular: Normal rate, regular rhythm and normal heart sounds. Exam reveals no gallop and no friction rub.  No murmur heard. Pulmonary/Chest: Effort normal and breath sounds normal. No respiratory distress. She has no wheezes.  Abdominal: Soft. Bowel sounds are normal. She exhibits no distension. There is no tenderness.  Neurological: She is alert and oriented to person, place, and time. She exhibits normal muscle tone. Coordination normal.  Skin: Skin is warm and dry. Capillary refill takes less than 2 seconds. No rash noted. No erythema.  Psychiatric: She has a normal mood and affect. Her behavior is normal.  Nursing note and vitals reviewed.    ED Treatments / Results  Labs (all labs ordered are listed, but only abnormal results are displayed) Labs Reviewed  BASIC METABOLIC PANEL - Abnormal; Notable for the following components:      Result Value   CO2 17 (*)    Glucose, Bld 520 (*)    Anion gap 20 (*)    All other components within normal limits  CBC - Abnormal; Notable for the following components:   WBC 16.3 (*)    RBC 5.64 (*)    Hemoglobin 16.0 (*)    HCT 48.3 (*)    All other components within normal limits  CBG MONITORING, ED - Abnormal; Notable for the following components:   Glucose-Capillary 505 (*)    All other components within normal limits  I-STAT VENOUS BLOOD GAS, ED - Abnormal; Notable for the following components:   Acid-base deficit 6.0 (*)    All other components within normal limits  URINALYSIS, ROUTINE W REFLEX MICROSCOPIC  I-STAT BETA HCG BLOOD, ED ([facility], [facility],  [facility] ONLY)    EKG None  Radiology No results found.  Procedures Procedures (including critical care time)  Medications Ordered in ED Medications  dextrose 5 %-0.45 % sodium chloride infusion (has no administration in time range)   insulin regular (NOVOLIN R,HUMULIN R) 100 Units in sodium chloride 0.9 % 100 mL (1 Units/mL) infusion (has no administration in time range)  sodium chloride 0.9 % bolus 500 mL (0 mLs Intravenous Stopped 06/27/18 1527)  promethazine (PHENERGAN) injection 25 mg (25 mg Intravenous Given 06/27/18 1422)  insulin aspart (novoLOG) injection 6 Units (6 Units Intravenous Given 06/27/18 1423)     Initial Impression / Assessment and Plan / ED Course  I have reviewed the triage vital signs and the nursing notes.  Pertinent labs & imaging results that were available during my care of the patient were reviewed by me and considered in my medical decision making (see chart for details).     She will need to be admitted for DKA.  The patient is advised of the plan and all questions were answered.  I did start the glucose stabilizer on the patient.  CRITICAL CARE Performed by: Resa Miner Mayerly Kaman Total critical care time: 35 minutes Critical care time was exclusive of separately billable procedures and treating other patients. Critical care was necessary to treat or prevent imminent or life-threatening deterioration. Critical care was time spent personally by me on the following activities: development of treatment plan with patient and/or surrogate as well as nursing, discussions with consultants, evaluation of patient's response to treatment, examination of patient, obtaining history from patient or surrogate, ordering and performing treatments and interventions, ordering and review of laboratory studies, ordering and review of radiographic studies, pulse oximetry and re-evaluation of patient's condition.   Final Clinical Impressions(s) / ED Diagnoses   Final diagnoses:  Diabetic ketoacidosis without coma associated with type 1 diabetes mellitus [facility])    ED Discharge Orders    None       Dalia Heading, PA-C 06/27/18 1531    Little, Wenda Overland, MD 06/28/18 [phone]

## 2018-06-28 DIAGNOSIS — N182 Chronic kidney disease, stage 2 (mild): Secondary | ICD-10-CM

## 2018-06-28 DIAGNOSIS — E1110 Type 2 diabetes mellitus with ketoacidosis without coma: Secondary | ICD-10-CM | POA: Diagnosis not present

## 2018-06-28 DIAGNOSIS — K3184 Gastroparesis: Secondary | ICD-10-CM | POA: Diagnosis not present

## 2018-06-28 DIAGNOSIS — F411 Generalized anxiety disorder: Secondary | ICD-10-CM

## 2018-06-28 DIAGNOSIS — Z794 Long term (current) use of insulin: Secondary | ICD-10-CM

## 2018-06-28 DIAGNOSIS — E039 Hypothyroidism, unspecified: Secondary | ICD-10-CM

## 2018-06-28 DIAGNOSIS — E7849 Other hyperlipidemia: Secondary | ICD-10-CM

## 2018-06-28 DIAGNOSIS — I428 Other cardiomyopathies: Secondary | ICD-10-CM

## 2018-06-28 LAB — BASIC METABOLIC PANEL
ANION GAP: 11 (ref 5–15)
ANION GAP: 12 (ref 5–15)
Anion gap: 9 (ref 5–15)
BUN: 12 mg/dL (ref 6–20)
BUN: 12 mg/dL (ref 6–20)
BUN: 7 mg/dL (ref 6–20)
CALCIUM: 8.3 mg/dL — AB (ref 8.9–10.3)
CALCIUM: 8.7 mg/dL — AB (ref 8.9–10.3)
CHLORIDE: 104 mmol/L (ref 98–111)
CO2: 23 mmol/L (ref 22–32)
CO2: 21 mmol/L — ABNORMAL LOW (ref 22–32)
CO2: 24 mmol/L (ref 22–32)
CREATININE: 0.65 mg/dL (ref 0.44–1.00)
Calcium: 8.7 mg/dL — ABNORMAL LOW (ref 8.9–10.3)
Chloride: 105 mmol/L (ref 98–111)
Chloride: 106 mmol/L (ref 98–111)
Creatinine, Ser: 0.68 mg/dL (ref 0.44–1.00)
Creatinine, Ser: 0.54 mg/dL (ref 0.44–1.00)
GFR calc Af Amer: >60 mL/min (ref >60)
GFR calc Af Amer: >60 mL/min (ref >60)
GFR calc non Af Amer: >60 mL/min (ref >60)
GLUCOSE: 152 mg/dL — AB (ref 70–99)
GLUCOSE: 135 mg/dL — AB (ref 70–99)
GLUCOSE: 135 mg/dL — AB (ref 70–99)
POTASSIUM: 3.7 mmol/L (ref 3.5–5.1)
Potassium: 3.4 mmol/L — ABNORMAL LOW (ref 3.5–5.1)
Potassium: 3.7 mmol/L (ref 3.5–5.1)
SODIUM: 139 mmol/L (ref 135–145)
SODIUM: 139 mmol/L (ref 135–145)
Sodium: 137 mmol/L (ref 135–145)

## 2018-06-28 LAB — URINALYSIS, ROUTINE W REFLEX MICROSCOPIC
BILIRUBIN URINE: NEGATIVE
Hgb urine dipstick: NEGATIVE
Ketones, ur: 20 mg/dL — AB
Leukocytes, UA: NEGATIVE
NITRITE: NEGATIVE
PH: 6 (ref 5.0–8.0)
Protein, ur: 30 mg/dL — AB
Specific Gravity, Urine: 1.015 (ref 1.005–1.030)

## 2018-06-28 LAB — GLUCOSE, CAPILLARY
GLUCOSE-CAPILLARY: 195 mg/dL — AB (ref 70–99)
GLUCOSE-CAPILLARY: 198 mg/dL — AB (ref 70–99)
GLUCOSE-CAPILLARY: 188 mg/dL — AB (ref 70–99)
Glucose-Capillary: 183 mg/dL — ABNORMAL HIGH (ref 70–99)
Glucose-Capillary: 144 mg/dL — ABNORMAL HIGH (ref 70–99)
Glucose-Capillary: 126 mg/dL — ABNORMAL HIGH (ref 70–99)
Glucose-Capillary: 108 mg/dL — ABNORMAL HIGH (ref 70–99)
Glucose-Capillary: 127 mg/dL — ABNORMAL HIGH (ref 70–99)
Glucose-Capillary: 134 mg/dL — ABNORMAL HIGH (ref 70–99)
Glucose-Capillary: 122 mg/dL — ABNORMAL HIGH (ref 70–99)
Glucose-Capillary: 197 mg/dL — ABNORMAL HIGH (ref 70–99)
Glucose-Capillary: 180 mg/dL — ABNORMAL HIGH (ref 70–99)
Glucose-Capillary: 225 mg/dL — ABNORMAL HIGH (ref 70–99)

## 2018-06-28 LAB — TROPONIN I

## 2018-06-28 MED ORDER — PANTOPRAZOLE SODIUM 40 MG PO TBEC
40.0000 mg | DELAYED_RELEASE_TABLET | Freq: Every day | ORAL | Status: DC
  Administered 2018-06-28 – 2018-06-29 (×2): 40 mg via ORAL
  Filled 2018-06-28 (×2): qty 1

## 2018-06-28 MED ORDER — INSULIN ASPART 100 UNIT/ML ~~LOC~~ SOLN
0.0000 [IU] | Freq: Every day | SUBCUTANEOUS | Status: DC
  Administered 2018-06-28: 2 [IU] via SUBCUTANEOUS

## 2018-06-28 MED ORDER — INSULIN GLARGINE 100 UNIT/ML ~~LOC~~ SOLN
22.0000 [IU] | Freq: Every day | SUBCUTANEOUS | Status: DC
  Administered 2018-06-28: 22 [IU] via SUBCUTANEOUS
  Filled 2018-06-28 (×3): qty 0.22

## 2018-06-28 MED ORDER — INSULIN ASPART 100 UNIT/ML ~~LOC~~ SOLN
0.0000 [IU] | Freq: Three times a day (TID) | SUBCUTANEOUS | Status: DC
  Administered 2018-06-28 – 2018-06-29 (×3): 3 [IU] via SUBCUTANEOUS
  Administered 2018-06-29: 8 [IU] via SUBCUTANEOUS

## 2018-06-28 MED ORDER — METOCLOPRAMIDE HCL 10 MG PO TABS
5.0000 mg | ORAL_TABLET | Freq: Three times a day (TID) | ORAL | Status: DC
  Administered 2018-06-28 – 2018-06-29 (×4): 5 mg via ORAL
  Filled 2018-06-28 (×4): qty 1

## 2018-06-28 MED ORDER — SERTRALINE HCL 50 MG PO TABS
50.0000 mg | ORAL_TABLET | Freq: Every day | ORAL | Status: DC
  Administered 2018-06-28 – 2018-06-29 (×2): 50 mg via ORAL
  Filled 2018-06-28 (×2): qty 1

## 2018-06-28 MED ORDER — POTASSIUM CHLORIDE CRYS ER 20 MEQ PO TBCR
40.0000 meq | EXTENDED_RELEASE_TABLET | Freq: Once | ORAL | Status: AC
  Administered 2018-06-28: 40 meq via ORAL
  Filled 2018-06-28: qty 2

## 2018-06-28 NOTE — Progress Notes (Signed)
PROGRESS NOTE    [name] [name]  ZTI:[id number] DOB: January 23, 1961 DOA: 06/27/2018 PCP: Jinny Sanders, MD   Brief Narrative:   [name] [name] is a 57 y.o. female with medical history significant of chronic systolic CHF, nonischemic cardiomyopathy, diabetes mellitus, who presents to the hospital with chief complaint of nausea vomiting and high sugars. She was found to be in DKA, started on insulin gtt and admitted to step down.     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  Resolved.  Her symptoms of nausea, vomiting and abdominal pain resolved.  Start her on clear liquid diet. Advance diet as tolerated.  As her cbgs are better and AG closed. Stop insulin gtt, restart the lantus 22 units daily and start SSI.  Resume hydration with IV fluids.     Stage 2 CKD Creatinine stable.    Type 2 DM Uncontrolled with hyperglycemia , A1C is 16 , last month.  Resume SSI for now.    Non ischemic cardiomyopathy:  She appears to be compensated. Resume aspirin,    Hypokalemia: replaced Repeat in am.    Hypertension:  Well controlled.   Hypothyroidism; Resume synthroid.     Hyperlipidemia: Resume lipitor and zetia.    Gastroparesis:  Resume reglan and protonix.    Generalized Anxiety Disorder:  Resume home meds.    Leukocytosis:  Currently no signs of infection.  UA ordered.  Some loose BM last night, none this am.  Monitor.  Repeat cbc IN AM.      DVT prophylaxis: lovenox.  Code Status: full code.    Family Communication: none at bedside.  Disposition Plan: pending clinical improvement.    Consultants:   NOne.    Procedures: none.    Antimicrobials:none.    Subjective: No chest pain or sob, no nausea or vomiting.   Objective: Vitals:   06/28/18 0800 06/28/18 0831 06/28/18 0847 06/28/18 1144  BP: (!) 87/77  108/73   Pulse: 83  82   Resp: 15  11   Temp:  98.3 F (36.8 C)  98.4 F (36.9 C)  TempSrc:    Oral  SpO2: 100%  99%   Weight:      Height:        Intake/Output Summary (Last 24 hours) at 06/28/2018 1154 Last data filed at 06/28/2018 0930 Gross per 24 hour  Intake 295.2 ml  Output -  Net 295.2 ml   Filed Weights   06/27/18 1242 06/27/18 1730  Weight: 64 kg (141 lb) 62.1 kg (136 lb 14.5 oz)    Examination:  General exam: Appears calm and comfortable  Respiratory system: Clear to auscultation. Respiratory effort normal. Cardiovascular system: S1 & S2 heard, RRR. No JVD, murmurs, rubs, gallops or clicks. No pedal edema. Gastrointestinal system: Abdomen is nondistended, soft and nontender. No organomegaly or masses felt. Normal bowel sounds heard. Central nervous system: Alert and oriented. No focal neurological deficits. Extremities: Symmetric 5 x 5 power. Skin: No rashes, lesions or ulcers Psychiatry: Mood & affect appropriate.  Data Reviewed: I have personally reviewed following labs and imaging studies  CBC: Recent Labs  Lab 06/27/18 1301  WBC 16.3*  HGB 16.0*  HCT 48.3*  MCV 85.6  PLT 161   Basic Metabolic Panel: Recent Labs  Lab 06/27/18 1301 06/27/18 1733 06/27/18 2017 06/28/18 0011 06/28/18 0529  NA 136 138 136 139 139  K 4.0 3.7 3.8 3.7 3.4*  CL 99 102 103 105 106  CO2 17* 21* 23 23 21*  GLUCOSE 520* 390* 288* 135* 135*  BUN 12 11 13 12 12   CREATININE 0.88 0.80 0.74 0.68 0.54  CALCIUM 8.9 8.7* 8.6* 8.7* 8.3*   GFR: Estimated Creatinine Clearance: 64.5 mL/min (by C-G formula based on SCr of 0.54 mg/dL). Liver  Function Tests: No results for input(s): AST, ALT, ALKPHOS, BILITOT, PROT, ALBUMIN in the last 168 hours. No results for input(s): LIPASE, AMYLASE in the last 168 hours. No results for input(s): AMMONIA in the last 168 hours. Coagulation Profile: No results for input(s): INR, PROTIME in the last 168 hours. Cardiac Enzymes: Recent Labs  Lab 06/27/18 1733 06/27/18 2017 06/28/18 0529  TROPONINI <0.03 <0.03 <0.03   BNP (last 3 results) No results for input(s): PROBNP in the last 8760 hours. HbA1C: No results for input(s): HGBA1C in the last 72 hours. CBG: Recent Labs  Lab 06/28/18 0652 06/28/18 0748 06/28/18 0912 06/28/18 1022 06/28/18 1137  GLUCAP 195* 198* 122* 188* 197*   Lipid Profile: No results for input(s): CHOL, HDL, LDLCALC, TRIG, CHOLHDL, LDLDIRECT in the last 72 hours. Thyroid Function Tests: No results for input(s): TSH, T4TOTAL, FREET4, T3FREE, THYROIDAB in the last 72 hours. Anemia Panel: No results for input(s): VITAMINB12, FOLATE, FERRITIN, TIBC, IRON, RETICCTPCT in the last 72 hours. Sepsis Labs: No results for input(s): PROCALCITON, LATICACIDVEN in the last 168 hours.  Recent Results (from the past 240 hour(s))  MRSA PCR Screening     Status: None   Collection Time: 06/27/18  5:45 PM  Result Value Ref Range Status   MRSA by PCR NEGATIVE NEGATIVE Final    Comment:        The GeneXpert MRSA Assay (FDA approved for NASAL specimens only), is one component of a comprehensive MRSA colonization surveillance program. It is not intended to diagnose MRSA infection nor to guide or monitor treatment for MRSA infections. Performed at [facility], [place] [address] [place], [place] [address]          Radiology Studies: No results found.      Scheduled Meds: . aspirin  81 mg Oral Daily  . atorvastatin  80 mg Oral q1800  . enoxaparin (LOVENOX) injection  40 mg Subcutaneous Q24H  . ezetimibe  10 mg Oral Daily  . insulin glargine  22 Units  Subcutaneous Daily  . levothyroxine  150 mcg Oral QAC breakfast  . metoprolol succinate  25 mg Oral Daily   Continuous Infusions: . sodium chloride Stopped (06/28/18 0700)     LOS: 0 days    Time spent: 35 minutes.     Hosie Poisson, MD [facility] Pager [phone]   If 7PM-7AM, please contact night-coverage [email or web address] Password [facility] 06/28/2018, 11:54 AM

## 2018-06-28 NOTE — Progress Notes (Signed)
CBG at 0914 is 122.  Irven Baltimore, RN

## 2018-06-29 DIAGNOSIS — K219 Gastro-esophageal reflux disease without esophagitis: Secondary | ICD-10-CM | POA: Diagnosis not present

## 2018-06-29 DIAGNOSIS — F411 Generalized anxiety disorder: Secondary | ICD-10-CM | POA: Diagnosis not present

## 2018-06-29 DIAGNOSIS — E1010 Type 1 diabetes mellitus with ketoacidosis without coma: Secondary | ICD-10-CM | POA: Diagnosis not present

## 2018-06-29 DIAGNOSIS — E1143 Type 2 diabetes mellitus with diabetic autonomic (poly)neuropathy: Secondary | ICD-10-CM | POA: Diagnosis not present

## 2018-06-29 LAB — CBC
HCT: 44.5 % (ref 36.0–46.0)
Hemoglobin: 14.6 g/dL (ref 12.0–15.0)
MCH: 28.1 pg (ref 26.0–34.0)
MCHC: 32.8 g/dL (ref 30.0–36.0)
MCV: 85.6 fL (ref 78.0–100.0)
PLATELETS: 305 10*3/uL (ref 150–400)
RBC: 5.2 MIL/uL — ABNORMAL HIGH (ref 3.87–5.11)
RDW: 15.6 % — ABNORMAL HIGH (ref 11.5–15.5)
WBC: 10.8 10*3/uL — ABNORMAL HIGH (ref 4.0–10.5)

## 2018-06-29 LAB — BASIC METABOLIC PANEL
Anion gap: 7 (ref 5–15)
BUN: 6 mg/dL (ref 6–20)
CO2: 22 mmol/L (ref 22–32)
CREATININE: 0.58 mg/dL (ref 0.44–1.00)
Calcium: 8.6 mg/dL — ABNORMAL LOW (ref 8.9–10.3)
Chloride: 108 mmol/L (ref 98–111)
GFR calc Af Amer: >60 mL/min (ref >60)
GFR calc non Af Amer: >60 mL/min (ref >60)
GLUCOSE: 244 mg/dL — AB (ref 70–99)
Potassium: 4.3 mmol/L (ref 3.5–5.1)
Sodium: 137 mmol/L (ref 135–145)

## 2018-06-29 LAB — GLUCOSE, CAPILLARY
GLUCOSE-CAPILLARY: 253 mg/dL — AB (ref 70–99)
GLUCOSE-CAPILLARY: 190 mg/dL — AB (ref 70–99)

## 2018-06-29 MED ORDER — INSULIN GLARGINE 100 UNIT/ML ~~LOC~~ SOLN
25.0000 [IU] | Freq: Every day | SUBCUTANEOUS | Status: DC
Start: 2018-06-29 — End: 2018-06-29
  Administered 2018-06-29: 25 [IU] via SUBCUTANEOUS
  Filled 2018-06-29: qty 0.25

## 2018-06-29 MED ORDER — INSULIN GLARGINE 100 UNIT/ML ~~LOC~~ SOLN: 25.0000 [IU] | Freq: Every day | SUBCUTANEOUS | 11 refills | Status: DC

## 2018-06-29 NOTE — Progress Notes (Signed)
[name] [name] to be D/C'd Home per MD order.  Discussed prescriptions and follow up appointments with the patient. Prescriptions given to patient, medication list explained in detail. Pt verbalized understanding.  Allergies as of 06/29/2018      Reactions   Codeine Hives, Anxiety, Other (See Comments)      Medication List    STOP taking these medications   BASAGLAR KWIKPEN 100 UNIT/ML Sopn Replaced by:  insulin glargine 100 UNIT/ML injection   ondansetron 4 MG tablet Commonly known as:  ZOFRAN   promethazine 25 MG suppository Commonly known as:  PHENERGAN     TAKE these medications   aspirin 81 MG chewable tablet Chew 1 tablet (81 mg total) by mouth daily.   atorvastatin 80 MG tablet Commonly known as:  LIPITOR Take 1 tablet (80 mg total) by mouth daily at 6 PM.   ezetimibe 10 MG tablet Commonly known as:  ZETIA Take 1 tablet (10 mg total) by mouth daily.   insulin glargine 100 UNIT/ML injection Commonly known as:  LANTUS Inject 0.25 mLs (25 Units total) into the skin daily. Replaces:  BASAGLAR KWIKPEN 100 UNIT/ML Sopn   insulin lispro 100 UNIT/ML KiwkPen Commonly known as:  HUMALOG KWIKPEN Inject into skin 3 times a day at meal time according to the following sliding scale: CBG 70-120 - 0 units / CBG 121-150 - 1 unit / CBG 151-200 - 2 units / CBG 201-250 - 3 units / CBG 251-300 - 5 units / CBG 301-350 - 7 units / CBG 351-400+ - 9 units  YOU MUST FOLLOW A STRICT DIABETIC DIET   levothyroxine 150 MCG tablet Commonly known as:  SYNTHROID, LEVOTHROID TAKE 1 TABLET BY MOUTH EVERY DAY   losartan 25 MG tablet Commonly known as:  COZAAR Take 25 mg by mouth daily.   metoCLOPramide 5 MG tablet Commonly known as:  REGLAN TAKE 1 TABLET BY MOUTH 4 TIMES A DAY BEFORE MEALS AND AT BEDTIME   metoprolol succinate 25 MG 24 hr tablet Commonly known as:  TOPROL-XL Take 25 mg by mouth daily.   nitroGLYCERIN 0.2 mg/hr patch Commonly known as:  NITRODUR - Dosed in mg/24  hr Place 1 patch (0.2 mg total) onto the skin daily as needed. What changed:  when to take this   onetouch ultrasoft lancets Use to check blood sugar daily as needed.  Dx: E11.10   pantoprazole 40 MG tablet Commonly known as:  PROTONIX Take 1 tablet (40 mg total) by mouth daily at 12 noon.   Pen Needles 31G X 8 MM Misc Use to inject insulin 4 times a day.  Dx: E11.10   potassium chloride SA 20 MEQ tablet Commonly known as:  K-DUR,KLOR-CON Take 1 tablet (20 mEq total) by mouth daily.   RELPAX 40 MG tablet Generic drug:  eletriptan ONE TABLET BY MOUTH AT ONSET OF HEADACHE. MAY REPEAT IN 2 HOURS IF HEADACHE PERSISTS OR RECURS.   sertraline 50 MG tablet Commonly known as:  ZOLOFT TAKE 1 TABLET BY MOUTH EVERY DAY   Vitamin D (Ergocalciferol) 50000 units Caps capsule Commonly known as:  DRISDOL TAKE 1 CAPSULE BY MOUTH EVERY 7 DAYS What changed:  See the new instructions.       Vitals:   06/28/18 1612 06/29/18 0407  BP: 113/76 (!) 128/100  Pulse: 83 77  Resp: 17 18  Temp: 98.5 F (36.9 C) 98.1 F (36.7 C)  SpO2: 99% 98%    Skin clean, dry and intact without evidence of skin  break down, no evidence of skin tears noted. IV catheter discontinued intact. Site without signs and symptoms of complications. Dressing and pressure applied. Pt denies pain at this time. No complaints noted.  An After Visit Summary was printed and given to the patient. Patient escorted via [place], and D/C home via private auto.  Chapman Fitch BSN, RN [employer] [facility] Phone [phone]

## 2018-06-30 NOTE — Discharge Summary (Signed)
Physician Discharge Summary  [name] [name] HYW:[id number] DOB: August 13, 1961 DOA: 06/27/2018  PCP: Jinny Sanders, MD  Admit date: 06/27/2018 Discharge date: 06/29/2018  Admitted From: Home.  Disposition:  Home.   Recommendations for Outpatient Follow-up:  1. Follow up with PCP in 1-2 weeks 2. Please obtain BMP/CBC in one week   Discharge Condition:stable.  CODE STATUS: full code.  Diet recommendation: Heart Healthy  Brief/Interim Summary: [name] C Weisneris a 57 y.o.femalewith medical history significant ofchronic systolic CHF, nonischemic cardiomyopathy, diabetes mellitus, who presents to the hospital with chief complaint of nausea vomiting and high sugars. She was found to be in DKA, started on insulin gtt and admitted to step down. her cbgs improved, insulin gtt stopped and transitioned to sq glargine .     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)   DKA (diabetic ketoacidoses) ([place])   Noncompliance  DKA; secondary to gastroenteritis vs gastroparesis.  Resolved.  Her symptoms of nausea, vomiting and abdominal pain resolved.  Start her on clear liquid diet. Advance diet as tolerated. She was able to tolerate soft diet.  As her cbgs are better and AG closed. Stop insulin gtt, restart the lantus 22 units daily and start SSI.  CBG (last 3)  Recent Labs    06/28/18 2123 06/29/18 0810 06/29/18 1220  GLUCAP 225* 253* 190*   Increased the glargine to 25 units and resume SSI on discharge.     Stage 2 CKD Creatinine stable.    Type 2 DM Uncontrolled with hyperglycemia , A1C is 16 , last month.  Resume SSI for now.     Non ischemic cardiomyopathy:  She appears to be compensated. Resume aspirin,    Hypokalemia: replaced    Hypertension:  Well controlled.   Hypothyroidism; Resume synthroid.     Hyperlipidemia: Resume lipitor and zetia.    Gastroparesis:  Resume reglan and protonix.    Generalized Anxiety Disorder:  Resume home meds.    Leukocytosis:  Currently no signs of infection.  UA ordered.  Some loose BM last night, none this am.  Repeat cbc shows near normal wbc count.       Discharge Instructions  Discharge Instructions    Diet - low sodium heart healthy   Complete by:  As directed    Discharge instructions   Complete by:  As directed    Follow upwith PCP in one week.     Allergies as of 06/29/2018      Reactions   Codeine Hives, Anxiety, Other (See Comments)      Medication List    STOP taking these medications   BASAGLAR KWIKPEN 100 UNIT/ML Sopn Replaced by:  insulin glargine 100 UNIT/ML injection   ondansetron 4 MG tablet Commonly known as:  ZOFRAN   promethazine 25 MG suppository Commonly known as:  PHENERGAN     TAKE these medications   aspirin 81 MG chewable tablet Chew 1 tablet (81 mg total) by mouth daily.   atorvastatin 80 MG tablet Commonly known as:  LIPITOR Take 1 tablet (80 mg total) by mouth daily at 6 PM.   ezetimibe 10 MG tablet Commonly known as:  ZETIA Take 1 tablet (10 mg total) by mouth daily.   insulin glargine 100 UNIT/ML injection Commonly known as:  LANTUS Inject 0.25 mLs (25 Units total) into the skin daily. Replaces:  BASAGLAR KWIKPEN 100 UNIT/ML Sopn   insulin lispro 100 UNIT/ML KiwkPen Commonly known as:  HUMALOG KWIKPEN Inject into skin 3 times a day at meal time according to the following sliding scale: CBG 70-120 - 0 units / CBG 121-150 - 1 unit / CBG 151-200 - 2 units / CBG 201-250 - 3 units / CBG 251-300 - 5 units / CBG 301-350 - 7 units / CBG 351-400+ - 9 units  YOU MUST  FOLLOW A STRICT DIABETIC DIET   levothyroxine 150 MCG tablet Commonly known as:  SYNTHROID, LEVOTHROID TAKE 1 TABLET BY MOUTH EVERY DAY   losartan 25 MG tablet Commonly known as:  COZAAR Take 25 mg by mouth daily.   metoCLOPramide 5 MG tablet Commonly known as:  REGLAN TAKE 1 TABLET BY MOUTH 4 TIMES A DAY BEFORE MEALS AND AT BEDTIME   metoprolol succinate 25 MG 24 hr tablet Commonly known as:  TOPROL-XL Take 25 mg by mouth daily.   nitroGLYCERIN 0.2 mg/hr patch Commonly known as:  NITRODUR - Dosed in mg/24 hr Place 1 patch (0.2 mg total) onto the skin daily as needed. What changed:  when to take this   onetouch ultrasoft lancets Use to check blood sugar daily as needed.  Dx: E11.10   pantoprazole 40 MG tablet Commonly known as:  PROTONIX Take 1 tablet (40 mg total) by mouth daily at 12 noon.   Pen Needles 31G X 8 MM Misc Use to inject insulin 4 times a day.  Dx: E11.10   potassium chloride SA 20 MEQ tablet Commonly known as:  K-DUR,KLOR-CON Take 1 tablet (20 mEq total) by mouth daily.   RELPAX 40 MG tablet Generic drug:  eletriptan ONE TABLET BY MOUTH AT ONSET OF HEADACHE. MAY REPEAT IN 2 HOURS IF HEADACHE PERSISTS OR RECURS.   sertraline 50 MG tablet Commonly known as:  ZOLOFT TAKE 1 TABLET BY MOUTH EVERY DAY   Vitamin D (Ergocalciferol) 50000 units Caps capsule Commonly known as:  DRISDOL TAKE 1 CAPSULE BY MOUTH EVERY 7 DAYS What changed:  See the new instructions.      Follow-up Information    Jinny Sanders, MD. Schedule an appointment as soon as possible for a visit in 1 week(s).   Specialty:  Family Medicine Contact information: [place] [address] [phone]        Minus Breeding, MD .   Specialty:  Cardiology Contact information: [address] [place] [address] [phone]          Allergies  Allergen Reactions  . Codeine Hives, Anxiety and Other (See Comments)     Consultations:  NOne.    Procedures/Studies:  No results found. None.    Subjective: No chest pain, sob, nausea, vomiting or abdominal pain.  No headache , dizziness.   Discharge Exam: Vitals:   06/28/18 1612 06/29/18 0407  BP: 113/76 (!) 128/100  Pulse: 83 77  Resp: 17 18  Temp: 98.5 F (36.9 C) 98.1 F (36.7 C)  SpO2: 99% 98%   Vitals:   06/28/18 1445 06/28/18 1545 06/28/18 1612 06/29/18 0407  BP: 110/77 118/82 113/76 (!) 128/100  Pulse: 76 84 83 77  Resp: 13 (!) 21 17 18   Temp:   98.5 F (36.9 C) 98.1 F (36.7 C)  TempSrc:   Oral Oral  SpO2: 97% 97% 99% 98%  Weight:      Height:        General: Pt is alert, awake, not in acute distress Cardiovascular: RRR, S1/S2 +, no rubs, no gallops Respiratory: CTA bilaterally, no wheezing, no rhonchi Abdominal: Soft, NT, ND, bowel sounds + Extremities: no edema, no cyanosis    The results of significant diagnostics from this hospitalization (including imaging, microbiology, ancillary and laboratory) are listed below for reference.     Microbiology: Recent Results (from the past 240 hour(s))  MRSA PCR Screening     Status: None   Collection Time: 06/27/18  5:45 PM  Result Value Ref Range Status   MRSA by PCR NEGATIVE NEGATIVE Final    Comment:        The GeneXpert MRSA Assay (FDA approved for NASAL specimens only), is one component of a comprehensive MRSA colonization surveillance program. It is not intended to diagnose MRSA infection nor to guide or monitor treatment for MRSA infections. Performed at [facility], [place] [address], [place], [place] [address]      Labs: BNP (last 3 results) No results for input(s): BNP in the last 8760 hours. Basic Metabolic Panel: Recent Labs  Lab 06/27/18 2017 06/28/18 0011 06/28/18 0529 06/28/18 1819 06/29/18 0341  NA 136 139 139 137 137  K 3.8 3.7 3.4* 3.7 4.3  CL 103 105 106 104 108  CO2 23 23 21* 24 22  GLUCOSE 288* 135* 135* 152* 244*  BUN  13 12 12 7 6   CREATININE 0.74 0.68 0.54 0.65 0.58  CALCIUM 8.6* 8.7* 8.3* 8.7* 8.6*   Liver Function Tests: No results for input(s): AST, ALT, ALKPHOS, BILITOT, PROT, ALBUMIN in the last 168 hours. No results for input(s): LIPASE, AMYLASE in the last 168 hours. No results for input(s): AMMONIA in the last 168 hours. CBC: Recent Labs  Lab 06/27/18 1301 06/29/18 0341  WBC 16.3* 10.8*  HGB 16.0* 14.6  HCT 48.3* 44.5  MCV 85.6 85.6  PLT 374 305   Cardiac Enzymes: Recent Labs  Lab 06/27/18 1733 06/27/18 2017 06/28/18 0529  TROPONINI <0.03 <0.03 <0.03   BNP: Invalid input(s): POCBNP CBG: Recent Labs  Lab 06/28/18 1137 06/28/18 1615 06/28/18 2123 06/29/18 0810 06/29/18 1220  GLUCAP 197* 180* 225* 253* 190*   D-Dimer No results for input(s): DDIMER in the last 72 hours. Hgb A1c No results for input(s): HGBA1C in the last 72 hours. Lipid Profile No results for input(s): CHOL, HDL, LDLCALC, TRIG, CHOLHDL, LDLDIRECT in the last 72 hours. Thyroid function studies No results for input(s): TSH, T4TOTAL, T3FREE, THYROIDAB in the last 72 hours.  Invalid input(s): FREET3 Anemia work up No results for input(s): VITAMINB12, FOLATE, FERRITIN, TIBC, IRON, RETICCTPCT in the last 72 hours. Urinalysis    Component Value Date/Time   COLORURINE YELLOW 06/28/2018 1723   APPEARANCEUR CLEAR 06/28/2018 1723   LABSPEC 1.015 06/28/2018 1723   PHURINE 6.0 06/28/2018 1723   GLUCOSEU >=500 (A) 06/28/2018 1723   HGBUR NEGATIVE 06/28/2018 1723   BILIRUBINUR NEGATIVE 06/28/2018 1723   BILIRUBINUR negative 09/11/2016 1038   KETONESUR 20 (A) 06/28/2018 1723   PROTEINUR 30 (A) 06/28/2018 1723   UROBILINOGEN 0.2 09/11/2016 1038   UROBILINOGEN 0.2 10/28/2013 1723   NITRITE NEGATIVE 06/28/2018 1723   LEUKOCYTESUR NEGATIVE 06/28/2018 1723   Sepsis Labs Invalid input(s): PROCALCITONIN,  WBC,  LACTICIDVEN Microbiology Recent Results (from the past 240 hour(s))  MRSA PCR Screening  Status: None   Collection Time: 06/27/18  5:45 PM  Result Value Ref Range Status   MRSA by PCR NEGATIVE NEGATIVE Final    Comment:        The GeneXpert MRSA Assay (FDA approved for NASAL specimens only), is one component of a comprehensive MRSA colonization surveillance program. It is not intended to diagnose MRSA infection nor to guide or monitor treatment for MRSA infections. Performed at [facility], [place] [address], [place], [place] [address]      Time coordinating discharge: 31 minutes  SIGNED:   Hosie Poisson, MD  [facility] 06/30/2018, 7:52 AM Pager   If 7PM-7AM, please contact night-coverage [email or web address] Password [email or web address]

## 2018-07-01 ENCOUNTER — Encounter: Payer: Self-pay | Admitting: Family Medicine

## 2018-07-01 ENCOUNTER — Ambulatory Visit: Payer: 59 | Admitting: Family Medicine

## 2018-07-01 VITALS — BP 108/70 | HR 84 | Temp 98.2°F | Ht 59.0 in | Wt 140.5 lb

## 2018-07-01 DIAGNOSIS — E1143 Type 2 diabetes mellitus with diabetic autonomic (poly)neuropathy: Secondary | ICD-10-CM

## 2018-07-01 DIAGNOSIS — K3184 Gastroparesis: Secondary | ICD-10-CM | POA: Diagnosis not present

## 2018-07-01 DIAGNOSIS — Z794 Long term (current) use of insulin: Secondary | ICD-10-CM | POA: Diagnosis not present

## 2018-07-01 DIAGNOSIS — E1110 Type 2 diabetes mellitus with ketoacidosis without coma: Secondary | ICD-10-CM

## 2018-07-01 DIAGNOSIS — F320 Major depressive disorder, single episode, mild: Secondary | ICD-10-CM

## 2018-07-01 DIAGNOSIS — E0810 Diabetes mellitus due to underlying condition with ketoacidosis without coma: Secondary | ICD-10-CM

## 2018-07-01 DIAGNOSIS — F331 Major depressive disorder, recurrent, moderate: Secondary | ICD-10-CM | POA: Insufficient documentation

## 2018-07-01 MED ORDER — INSULIN LISPRO 100 UNIT/ML (KWIKPEN): PEN_INJECTOR | SUBCUTANEOUS | 11 refills | Status: DC

## 2018-07-01 NOTE — Assessment & Plan Note (Signed)
Improved control at this time on reglan.

## 2018-07-01 NOTE — Assessment & Plan Note (Signed)
Increase sertraline to 100 mg daily. Follow up at next scheduled OV in 07/2018.

## 2018-07-01 NOTE — Assessment & Plan Note (Signed)
DKA resolved.  Pt not able to get Lantus covered.. Will await prior auth.. Info sent to hospitalist.  Not sure why change from basalgar to Lantus.  Continue Basalagar for now as referring to  New ENDO and they may change further.  Encouraged strict diabetic diet.  Refilled SSI humalog.

## 2018-07-01 NOTE — Patient Instructions (Addendum)
Increase sertraline to 100 mg daily. Please stop at the front desk to set up referral.  Continue basalgar 23 units (or can change to lantus if prior approval is completed by hospitalist) and humalog SSI unitl new recs from new ENDO.   Keep up with fluids.

## 2018-07-01 NOTE — Progress Notes (Signed)
Subjective:    Patient ID: [name] [name], female    DOB: [birth year], 57 y.o.   MRN: [id number]  HPI    57 year old female pt with poorly controlled brittle DM presents for hospital follow up. This is second admission since 03/2018 this year.  She has been admitted 6 times this year for DKA.  for nausea, vomiting and high sugars.. Found to be in DKA. Admit date: 06/27/2018 Discharge date: 06/29/2018  Summary copied as follows: 1. DKA; secondary to gastroenteritis vs gastroparesis.  Resolved.  Her symptoms of nausea, vomiting and abdominal pain resolved.  Start her on clear liquid diet. Advance diet as tolerated. She was able to tolerate soft diet.  As her cbgs are better and AG closed. Stop insulin gtt, restart the lantus 22 units daily and start SSI.  2.Leukocytosis:  Currently no signs of infection.  UA ordered.  Some loose BM last night, none this am.  Repeat cbc shows near normal wbc count.  3. Other chronic issues were stable in hospital.No med changes made.  07/01/18 Today DM.Marland Kitchen Poor control.. Last a1c 05/28/2018 16.7 Currently on Basalgar 23 Units (hospital wanted her to use lantus instead ( insurance would not cover lantus) and  humalog SSI.  No abd pain since discharged from hospital. No chest pain. FBS 120, postprandial 200. She is not seeing ENDO.. " I cannot find one because not accepting pts" her previous ENDO will not see her from missed appts.   She reports she has been under a lot of stress lately with tragedy and death in the family. She is currently sertraline for her mood 50 mg daily. She has been more depressed and anxious lately. PHQ9 6   Keep appt with cardiology later in this week. Discuss low Bps and Bp meds.   Social History /Family History/Past Medical History reviewed in detail and updated in EMR if needed. Blood pressure 108/70, pulse 84, temperature 98.2 F (36.8 C), temperature source Oral, height 4\' 11"  (1.499 m), weight 140 lb 8 oz (63.7 kg),  last menstrual period 08/11/2012, SpO2 97 %.  Review of Systems  Constitutional: Negative for fatigue and fever.  HENT: Negative for congestion.   Eyes: Negative for pain.  Respiratory: Negative for cough and shortness of breath.   Cardiovascular: Negative for chest pain, palpitations and leg swelling.  Gastrointestinal: Negative for abdominal pain.  Genitourinary: Negative for dysuria and vaginal bleeding.  Musculoskeletal: Negative for back pain.  Neurological: Positive for light-headedness. Negative for syncope and headaches.  Psychiatric/Behavioral: Negative for dysphoric mood.       Objective:   Physical Exam  Constitutional: Vital signs are normal. She appears well-developed and well-nourished. She is cooperative.  Non-toxic appearance. She does not appear ill. No distress.  HENT:  Head: Normocephalic.  Right Ear: Hearing, tympanic membrane, external ear and ear canal normal. Tympanic membrane is not erythematous, not retracted and not bulging.  Left Ear: Hearing, tympanic membrane, external ear and ear canal normal. Tympanic membrane is not erythematous, not retracted and not bulging.  Nose: No mucosal edema or rhinorrhea. Right sinus exhibits no maxillary sinus tenderness and no frontal sinus tenderness. Left sinus exhibits no maxillary sinus tenderness and no frontal sinus tenderness.  Mouth/Throat: Uvula is midline, oropharynx is clear and moist and mucous membranes are normal.  Eyes: Pupils are equal, round, and reactive to light. Conjunctivae, EOM and lids are normal. Lids are everted and swept, no foreign bodies found.  Neck: Trachea normal and normal range of  motion. Neck supple. Carotid bruit is not present. No thyroid mass and no thyromegaly present.  Cardiovascular: Normal rate, regular rhythm, S1 normal, S2 normal, normal heart sounds, intact distal pulses and normal pulses. Exam reveals no gallop and no friction rub.  No murmur heard. Pulmonary/Chest: Effort normal and  breath sounds normal. No tachypnea. No respiratory distress. She has no decreased breath sounds. She has no wheezes. She has no rhonchi. She has no rales.  Abdominal: Soft. Normal appearance and bowel sounds are normal. There is no tenderness.  Neurological: She is alert.  Skin: Skin is warm, dry and intact. No rash noted.  Psychiatric: Her speech is normal and behavior is normal. Judgment and thought content normal. Her mood appears not anxious. Cognition and memory are normal. She does not exhibit a depressed mood.          Assessment & Plan:

## 2018-07-02 NOTE — Progress Notes (Signed)
Cardiology Office Note   Date:  07/04/2018   ID:  [name] [name], DOB 1961/11/01, MRN [id number]  PCP:  Jinny Sanders, MD  Cardiologist:   Minus Breeding, MD   Chief Complaint  Patient presents with  . Shortness of Breath      History of Present Illness: [name] [name] is a 57 y.o. female who presents for follow up of atrial fib. She had atrial fib in 2012.  She had an abnormal stress test with normal coronaries on cath.  EF is 55%.  She was having problems with her thyroid at that point in time. She had chest pain and a POET (Plain Old Exercise Treadmill) .  This was negative for ischemia.  I did send her for an event monitor to make sure she was not having any recurrent fibrillation.  There was never any evidence of fibrillation.  She was taken off of anticoagulation as it was felt that her atrial fibrillation was related to her thyroid problems at the time.  These had since been corrected.  In 2018 she was found to have an elevated troponin and an EF of 35-40%.  Cardiac cath demonstrated normal coronaries however.    She has very poorly controlled diabetes.  Since I last saw her she was in the hosptial twice  with poorly DKA.. I have reviewed the patient's medical history in detail and updated the computerized patient record.   She is unfortunately not compliant with medications.  Lexiscan Myoview in May was within normal ejection fraction and no evidence of ischemia.  Since going home she is done okay.  She says her blood sugars are better controlled.  She has had a little dyspnea at times.  She is also had a little lightheadedness with some low blood pressures.  She is getting dizzy on a couple of occasions.    Past Medical History:  Diagnosis Date  . Anxiety   . B12 deficiency   . Chest pain 04/05/2017  . CHF (congestive heart failure) ([place])   . DKA (diabetic ketoacidoses) ([place]) 05/25/2018  . GERD (gastroesophageal reflux disease)   . Hyperlipidemia   . Hypertension    [name]   . Hypothyroidism   . IBS (irritable bowel syndrome)   . Intermittent vertigo   . 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 Leg cramping    "@ night" (09/18/2013)  . Migraine    "once q couple months" (09/18/2013)  . Osteoporosis   . Peripheral neuropathy   . Type II diabetes mellitus ([place])   . Umbilical hernia    unrepaired (09/18/2013)    Past Surgical History:  Procedure Laterality Date  . CESAREAN SECTION  1989  . LAPAROSCOPIC ASSISTED VAGINAL HYSTERECTOMY  09/23/2012   Procedure: LAPAROSCOPIC ASSISTED VAGINAL HYSTERECTOMY;  Surgeon: Linda Hedges, DO;  Location: [facility];  Service: Gynecology;  Laterality: N/A;  pull Dr Gregor Hams instrument  . LEFT HEART CATH AND CORONARY ANGIOGRAPHY N/A 02/11/2017   Procedure: Left Heart Cath and Coronary Angiography;  Surgeon: Lorretta Harp, MD;  Location: [facility];  Service: Cardiovascular;  Laterality: N/A;  . TUBAL LIGATION Bilateral 1992     Current Outpatient Medications  Medication Sig Dispense Refill  . aspirin 81 MG chewable tablet Chew 1 tablet (81 mg total) by mouth daily. 30 tablet 0  . atorvastatin (LIPITOR) 80 MG tablet Take 1 tablet (80 mg total) by mouth daily at 6 PM. 90 tablet 3  . ezetimibe (  ZETIA) 10 MG tablet Take 1 tablet (10 mg total) by mouth daily. 30 tablet 11  . insulin glargine (LANTUS) 100 UNIT/ML injection Inject 0.25 mLs (25 Units total) into the skin daily. 10 mL 11  . insulin lispro (HUMALOG KWIKPEN) 100 UNIT/ML KiwkPen Inject into skin 3 times a day at meal time according to the following sliding scale: CBG 70-120 - 0 units / CBG 121-150 - 1 unit / CBG 151-200 - 2 units / CBG 201-250 - 3 units / CBG 251-300 - 5 units / CBG 301-350 - 7 units / CBG 351-400+ - 9 units  YOU MUST FOLLOW A STRICT DIABETIC DIET 4 pen 11  . Insulin Pen Needle (PEN NEEDLES) 31G X 8 MM MISC Use to inject insulin 4 times a day.  Dx: E11.10 130 each 11  . Lancets (ONETOUCH ULTRASOFT) lancets Use to  check blood sugar daily as needed.  Dx: E11.10 100 each 3  . levothyroxine (SYNTHROID, LEVOTHROID) 150 MCG tablet TAKE 1 TABLET BY MOUTH EVERY DAY 30 tablet 3  . losartan (COZAAR) 25 MG tablet Take 25 mg by mouth daily.  12  . metoCLOPramide (REGLAN) 5 MG tablet TAKE 1 TABLET BY MOUTH 4 TIMES A DAY BEFORE MEALS AND AT BEDTIME 120 tablet 1  . metoprolol succinate (TOPROL-XL) 25 MG 24 hr tablet Take 25 mg by mouth daily.  12  . nitroGLYCERIN (NITRODUR - DOSED IN MG/24 HR) 0.2 mg/hr patch Place 1 patch (0.2 mg total) onto the skin daily as needed. (Patient taking differently: Place 0.2 mg onto the skin daily. ) 10 patch 12  . ONETOUCH VERIO test strip USE TO CHECK BLOOD SUGAR DAILY AS NEEDED. DX: E11.10  3  . pantoprazole (PROTONIX) 40 MG tablet Take 1 tablet (40 mg total) by mouth daily at 12 noon. 30 tablet 11  . potassium chloride SA (K-DUR,KLOR-CON) 20 MEQ tablet Take 1 tablet (20 mEq total) by mouth daily. 5 tablet 0  . RELPAX 40 MG tablet ONE TABLET BY MOUTH AT ONSET OF HEADACHE. MAY REPEAT IN 2 HOURS IF HEADACHE PERSISTS OR RECURS. 10 tablet 2  . sertraline (ZOLOFT) 50 MG tablet TAKE 1 TABLET BY MOUTH EVERY DAY 90 tablet 1  . Vitamin D, Ergocalciferol, (DRISDOL) 50000 units CAPS capsule TAKE 1 CAPSULE BY MOUTH EVERY 7 DAYS (Patient taking differently: TAKE 1 CAPSULE BY MOUTH EVERY 7 DAYS Wednesdays) 12 capsule 0  . furosemide (LASIX) 20 MG tablet Take 1 tablet (20 mg total) by mouth daily. 30 tablet 2   No current facility-administered medications for this visit.     Allergies:   Codeine    ROS:  Please see the history of present illness.   Otherwise, review of systems are positive for none.   All other systems are reviewed and negative.    PHYSICAL EXAM: VS:  BP 100/66   Pulse 88   Ht 5' (1.524 m)   Wt 140 lb 12.8 oz (63.9 kg)   LMP 08/11/2012   BMI 27.50 kg/m  , BMI Body mass index is 27.5 kg/m.  GENERAL:  Well appearing NECK:  No jugular venous distention, waveform within  normal limits, carotid upstroke brisk and symmetric, no bruits, no thyromegaly LUNGS:  Clear to auscultation bilaterally CHEST:  Unremarkable HEART:  PMI not displaced or sustained,S1 and S2 within normal limits, no S3, no S4, no clicks, no rubs, no murmurs ABD:  Flat, positive bowel sounds normal in frequency in pitch, no bruits, no rebound, no guarding, no midline  pulsatile mass, no hepatomegaly, no splenomegaly EXT:  2 plus pulses throughout, no edema, no cyanosis no clubbing   After the echocardiogram   EKG:  EKG is not ordered today.   Recent Labs: 12/11/2017: TSH 0.016 02/02/2018: Magnesium 1.9 05/26/2018: ALT 16 06/29/2018: BUN 6; Creatinine, Ser 0.58; Hemoglobin 14.6; Platelets 305; Potassium 4.3; Sodium 137    Lipid Panel    Component Value Date/Time   CHOL 256 (H) 12/12/2017 2000   TRIG 71 12/12/2017 2000   HDL 55 12/12/2017 2000   CHOLHDL 4.7 12/12/2017 2000   VLDL 14 12/12/2017 2000   LDLCALC 187 (H) 12/12/2017 2000   LDLDIRECT 206.0 07/27/2014 0745   Lab Results  Component Value Date   HGBA1C 16.7 (H) 05/28/2018     Wt Readings from Last 3 Encounters:  07/04/18 140 lb 12.8 oz (63.9 kg)  07/01/18 140 lb 8 oz (63.7 kg)  06/27/18 136 lb 14.5 oz (62.1 kg)      Other studies Reviewed: Additional studies/ records that were reviewed today include: Extensive hospital records  Review of the above records demonstrates:    ASSESSMENT AND PLAN:   Chronic systolic/diastolic heart failure-       She has some increased dyspnea.  I am getting give her Lasix 10 mg daily to be taken for a couple of days and then as needed if she has a 2 pound weight gain in a day.  She understands to reduce her salt and fluid intake.  Of note her ejection fraction was low on previous echo.  However, it was better on the follow-up stress perfusion study recently.  I will follow this up with echocardiograms in the future.  Noncardiac chest pain -   She has a history of chronic chest pain.   However, this does not seem to be particularly symptomatic.  No change in therapy.  HTN -  BP is low she will let me know if she has any increased lightheadedness and I might actually have to back down on the  Coreg.     Poorly controlled diabetes -      She has very poorly controlled diabetes as above.  This will be followed by Jinny Sanders, MD   Previous history of atrial fibrillation -   She has no symptomatic paroxysms of this.   Questionable sleep apnea.   She has had low O2 sats in the hospital.   We will try to arrange a sleep study.      Current medicines are reviewed at length with the patient today.  The patient does not have concerns regarding medicines.  The following changes have been made:  As above.  Labs/ tests ordered today include:  None  No orders of the defined types were placed in this encounter.    Disposition:   FU with APP in six months.   Signed, Minus Breeding, MD  07/04/2018 9:23 AM    [facility]

## 2018-07-04 ENCOUNTER — Encounter: Payer: Self-pay | Admitting: Cardiology

## 2018-07-04 ENCOUNTER — Ambulatory Visit: Payer: 59 | Admitting: Cardiology

## 2018-07-04 VITALS — BP 100/66 | HR 88 | Ht 60.0 in | Wt 140.8 lb

## 2018-07-04 DIAGNOSIS — I5032 Chronic diastolic (congestive) heart failure: Secondary | ICD-10-CM | POA: Diagnosis not present

## 2018-07-04 DIAGNOSIS — R079 Chest pain, unspecified: Secondary | ICD-10-CM

## 2018-07-04 DIAGNOSIS — G8929 Other chronic pain: Secondary | ICD-10-CM | POA: Diagnosis not present

## 2018-07-04 DIAGNOSIS — I10 Essential (primary) hypertension: Secondary | ICD-10-CM | POA: Diagnosis not present

## 2018-07-04 MED ORDER — FUROSEMIDE 20 MG PO TABS: 20.0000 mg | ORAL_TABLET | Freq: Every day | ORAL | 2 refills | Status: DC

## 2018-07-04 NOTE — Patient Instructions (Signed)
Medication Instructions:  START- Lasix 20 mg take 1/2 tablet(10 mg) for 2 days then take as needed  If you need a refill on your cardiac medications before your next appointment, please call your pharmacy.  Labwork: None Ordered   Testing/Procedures: None Ordered  Follow-Up: Your physician wants you to follow-up in: 6 Months with Rhonda Barrett. You should receive a reminder letter in the mail two months in advance. If you do not receive a letter, please call our office [phone].      Thank you for choosing [facility] at [facility]!!

## 2018-07-20 ENCOUNTER — Other Ambulatory Visit: Payer: Self-pay | Admitting: Family Medicine

## 2018-07-21 NOTE — Telephone Encounter (Signed)
Last office visit 07/01/2018.  Last refilled 04/22/2018 for #12 with no refills.  Rx looks like it is written for Vitamin D2.  Last Vit D level checked on 01/21/2018 which was low at 18.46 ng/ml.  Refill?

## 2018-07-22 MED ORDER — VITAMIN D3 1.25 MG (50000 UT) PO CAPS: 1.0000 | ORAL_CAPSULE | ORAL | 0 refills | Status: DC

## 2018-07-29 ENCOUNTER — Ambulatory Visit: Payer: 59 | Admitting: Family Medicine

## 2018-07-29 DIAGNOSIS — Z0289 Encounter for other administrative examinations: Secondary | ICD-10-CM

## 2018-08-04 ENCOUNTER — Encounter (HOSPITAL_COMMUNITY): Payer: Self-pay

## 2018-08-04 ENCOUNTER — Observation Stay (HOSPITAL_COMMUNITY)
Admission: EM | Admit: 2018-08-04 | Discharge: 2018-08-05 | Disposition: A | Discharge status: Home / Self Care | Payer: 59 | Attending: Internal Medicine | Admitting: Internal Medicine

## 2018-08-04 ENCOUNTER — Emergency Department (HOSPITAL_COMMUNITY): Payer: 59

## 2018-08-04 DIAGNOSIS — E1165 Type 2 diabetes mellitus with hyperglycemia: Secondary | ICD-10-CM | POA: Diagnosis present

## 2018-08-04 DIAGNOSIS — Z885 Allergy status to narcotic agent status: Secondary | ICD-10-CM | POA: Insufficient documentation

## 2018-08-04 DIAGNOSIS — K580 Irritable bowel syndrome with diarrhea: Secondary | ICD-10-CM | POA: Diagnosis not present

## 2018-08-04 DIAGNOSIS — Z79899 Other long term (current) drug therapy: Secondary | ICD-10-CM | POA: Diagnosis not present

## 2018-08-04 DIAGNOSIS — R739 Hyperglycemia, unspecified: Secondary | ICD-10-CM | POA: Diagnosis not present

## 2018-08-04 DIAGNOSIS — E039 Hypothyroidism, unspecified: Secondary | ICD-10-CM | POA: Diagnosis present

## 2018-08-04 DIAGNOSIS — N182 Chronic kidney disease, stage 2 (mild): Secondary | ICD-10-CM | POA: Diagnosis not present

## 2018-08-04 DIAGNOSIS — E538 Deficiency of other specified B group vitamins: Secondary | ICD-10-CM | POA: Diagnosis not present

## 2018-08-04 DIAGNOSIS — I5022 Chronic systolic (congestive) heart failure: Secondary | ICD-10-CM | POA: Diagnosis present

## 2018-08-04 DIAGNOSIS — Z87891 Personal history of nicotine dependence: Secondary | ICD-10-CM | POA: Diagnosis not present

## 2018-08-04 DIAGNOSIS — Z9119 Patient's noncompliance with other medical treatment and regimen: Secondary | ICD-10-CM | POA: Diagnosis not present

## 2018-08-04 DIAGNOSIS — Z794 Long term (current) use of insulin: Secondary | ICD-10-CM | POA: Insufficient documentation

## 2018-08-04 DIAGNOSIS — E785 Hyperlipidemia, unspecified: Secondary | ICD-10-CM | POA: Insufficient documentation

## 2018-08-04 DIAGNOSIS — Z9114 Patient's other noncompliance with medication regimen: Secondary | ICD-10-CM | POA: Insufficient documentation

## 2018-08-04 DIAGNOSIS — E1143 Type 2 diabetes mellitus with diabetic autonomic (poly)neuropathy: Secondary | ICD-10-CM | POA: Diagnosis present

## 2018-08-04 DIAGNOSIS — E1142 Type 2 diabetes mellitus with diabetic polyneuropathy: Secondary | ICD-10-CM

## 2018-08-04 DIAGNOSIS — E1110 Type 2 diabetes mellitus with ketoacidosis without coma: Secondary | ICD-10-CM

## 2018-08-04 DIAGNOSIS — E1149 Type 2 diabetes mellitus with other diabetic neurological complication: Secondary | ICD-10-CM

## 2018-08-04 DIAGNOSIS — E1122 Type 2 diabetes mellitus with diabetic chronic kidney disease: Secondary | ICD-10-CM | POA: Insufficient documentation

## 2018-08-04 DIAGNOSIS — I130 Hypertensive heart and chronic kidney disease with heart failure and stage 1 through stage 4 chronic kidney disease, or unspecified chronic kidney disease: Secondary | ICD-10-CM | POA: Diagnosis not present

## 2018-08-04 DIAGNOSIS — Z7982 Long term (current) use of aspirin: Secondary | ICD-10-CM | POA: Insufficient documentation

## 2018-08-04 DIAGNOSIS — K3184 Gastroparesis: Secondary | ICD-10-CM

## 2018-08-04 DIAGNOSIS — K219 Gastro-esophageal reflux disease without esophagitis: Secondary | ICD-10-CM | POA: Diagnosis not present

## 2018-08-04 DIAGNOSIS — R000 Tachycardia, unspecified: Secondary | ICD-10-CM | POA: Diagnosis present

## 2018-08-04 DIAGNOSIS — E876 Hypokalemia: Secondary | ICD-10-CM | POA: Diagnosis present

## 2018-08-04 DIAGNOSIS — I10 Essential (primary) hypertension: Secondary | ICD-10-CM | POA: Diagnosis present

## 2018-08-04 DIAGNOSIS — Z91199 Patient's noncompliance with other medical treatment and regimen due to unspecified reason: Secondary | ICD-10-CM

## 2018-08-04 LAB — URINALYSIS, ROUTINE W REFLEX MICROSCOPIC
Bilirubin Urine: NEGATIVE
Glucose, UA: >=500 mg/dL — AB
Hgb urine dipstick: NEGATIVE
KETONES UR: 80 mg/dL — AB
LEUKOCYTES UA: NEGATIVE
Nitrite: NEGATIVE
PH: 6 (ref 5.0–8.0)
Protein, ur: 100 mg/dL — AB
SPECIFIC GRAVITY, URINE: 1.032 — AB (ref 1.005–1.030)

## 2018-08-04 LAB — BASIC METABOLIC PANEL
ANION GAP: 17 — AB (ref 5–15)
BUN: 8 mg/dL (ref 6–20)
CHLORIDE: 101 mmol/L (ref 98–111)
CO2: 22 mmol/L (ref 22–32)
CREATININE: 0.65 mg/dL (ref 0.44–1.00)
Calcium: 9 mg/dL (ref 8.9–10.3)
GFR calc non Af Amer: >60 mL/min (ref >60)
Glucose, Bld: 242 mg/dL — ABNORMAL HIGH (ref 70–99)
Potassium: 3.2 mmol/L — ABNORMAL LOW (ref 3.5–5.1)
Sodium: 140 mmol/L (ref 135–145)

## 2018-08-04 LAB — COMPREHENSIVE METABOLIC PANEL
ALBUMIN: 4.2 g/dL (ref 3.5–5.0)
ALT: 19 U/L (ref 0–44)
AST: 22 U/L (ref 15–41)
Alkaline Phosphatase: 107 U/L (ref 38–126)
Anion gap: 18 — ABNORMAL HIGH (ref 5–15)
BUN: 9 mg/dL (ref 6–20)
CHLORIDE: 100 mmol/L (ref 98–111)
CO2: 20 mmol/L — AB (ref 22–32)
Calcium: 9.6 mg/dL (ref 8.9–10.3)
Creatinine, Ser: 0.68 mg/dL (ref 0.44–1.00)
GFR calc Af Amer: >60 mL/min (ref >60)
GFR calc non Af Amer: >60 mL/min (ref >60)
GLUCOSE: 374 mg/dL — AB (ref 70–99)
Potassium: 3.4 mmol/L — ABNORMAL LOW (ref 3.5–5.1)
Sodium: 138 mmol/L (ref 135–145)
Total Bilirubin: 1 mg/dL (ref 0.3–1.2)
Total Protein: 7.6 g/dL (ref 6.5–8.1)

## 2018-08-04 LAB — CBG MONITORING, ED: Glucose-Capillary: 351 mg/dL — ABNORMAL HIGH (ref 70–99)

## 2018-08-04 LAB — I-STAT VENOUS BLOOD GAS, ED
ACID-BASE DEFICIT: 4 mmol/L — AB (ref 0.0–2.0)
Bicarbonate: 19.9 mmol/L — ABNORMAL LOW (ref 20.0–28.0)
O2 Saturation: 85 %
PCO2 VEN: 31.6 mmHg — AB (ref 44.0–60.0)
TCO2: 21 mmol/L — ABNORMAL LOW (ref 22–32)
pH, Ven: 7.408 (ref 7.250–7.430)
pO2, Ven: 49 mmHg — ABNORMAL HIGH (ref 32.0–45.0)

## 2018-08-04 LAB — CBC
HEMATOCRIT: 47.1 % — AB (ref 36.0–46.0)
Hemoglobin: 16.5 g/dL — ABNORMAL HIGH (ref 12.0–15.0)
MCH: 29.3 pg (ref 26.0–34.0)
MCHC: 35 g/dL (ref 30.0–36.0)
MCV: 83.5 fL (ref 78.0–100.0)
Platelets: 335 10*3/uL (ref 150–400)
RBC: 5.64 MIL/uL — ABNORMAL HIGH (ref 3.87–5.11)
RDW: 13.8 % (ref 11.5–15.5)
WBC: 16.3 10*3/uL — ABNORMAL HIGH (ref 4.0–10.5)

## 2018-08-04 LAB — GLUCOSE, CAPILLARY
Glucose-Capillary: 285 mg/dL — ABNORMAL HIGH (ref 70–99)
Glucose-Capillary: 225 mg/dL — ABNORMAL HIGH (ref 70–99)
Glucose-Capillary: 208 mg/dL — ABNORMAL HIGH (ref 70–99)

## 2018-08-04 LAB — HEMOGLOBIN A1C
Hgb A1c MFr Bld: 14.5 % — ABNORMAL HIGH (ref 4.8–5.6)
MEAN PLASMA GLUCOSE: 369.45 mg/dL

## 2018-08-04 LAB — I-STAT CG4 LACTIC ACID, ED: LACTIC ACID, VENOUS: 1.4 mmol/L (ref 0.5–1.9)

## 2018-08-04 LAB — I-STAT BETA HCG BLOOD, ED (MC, WL, AP ONLY): I-stat hCG, quantitative: <5 m[IU]/mL (ref <5)

## 2018-08-04 LAB — I-STAT TROPONIN, ED: TROPONIN I, POC: 0.01 ng/mL (ref 0.00–0.08)

## 2018-08-04 LAB — LIPASE, BLOOD: LIPASE: 48 U/L (ref 11–51)

## 2018-08-04 MED ORDER — NITROGLYCERIN 0.2 MG/HR TD PT24
0.2000 mg | MEDICATED_PATCH | Freq: Every day | TRANSDERMAL | Status: DC
  Administered 2018-08-04 – 2018-08-05 (×2): 0.2 mg via TRANSDERMAL
  Filled 2018-08-04 (×2): qty 1

## 2018-08-04 MED ORDER — SODIUM CHLORIDE 0.9 % IV SOLN
INTRAVENOUS | Status: DC
  Filled 2018-08-04: qty 1

## 2018-08-04 MED ORDER — SODIUM CHLORIDE 0.9 % IV BOLUS
500.0000 mL | Freq: Once | INTRAVENOUS | Status: AC
  Administered 2018-08-04: 500 mL via INTRAVENOUS

## 2018-08-04 MED ORDER — ELETRIPTAN HYDROBROMIDE 40 MG PO TABS
40.0000 mg | ORAL_TABLET | ORAL | Status: DC | PRN
  Filled 2018-08-04: qty 1

## 2018-08-04 MED ORDER — KETOROLAC TROMETHAMINE 15 MG/ML IJ SOLN: 15.0000 mg | Freq: Four times a day (QID) | INTRAMUSCULAR | Status: DC | PRN

## 2018-08-04 MED ORDER — LOSARTAN POTASSIUM 25 MG PO TABS
25.0000 mg | ORAL_TABLET | Freq: Every day | ORAL | Status: DC
  Administered 2018-08-05: 25 mg via ORAL
  Filled 2018-08-04: qty 1

## 2018-08-04 MED ORDER — POTASSIUM CHLORIDE IN NACL 20-0.9 MEQ/L-% IV SOLN
INTRAVENOUS | Status: DC
  Administered 2018-08-04 – 2018-08-05 (×3): via INTRAVENOUS
  Filled 2018-08-04 (×3): qty 1000

## 2018-08-04 MED ORDER — INSULIN GLARGINE 100 UNIT/ML ~~LOC~~ SOLN
22.0000 [IU] | Freq: Every day | SUBCUTANEOUS | Status: DC
  Administered 2018-08-05: 22 [IU] via SUBCUTANEOUS
  Filled 2018-08-04: qty 0.22

## 2018-08-04 MED ORDER — POTASSIUM CHLORIDE 10 MEQ/100ML IV SOLN
10.0000 meq | INTRAVENOUS | Status: DC
  Filled 2018-08-04: qty 100

## 2018-08-04 MED ORDER — FUROSEMIDE 20 MG PO TABS: 20.0000 mg | ORAL_TABLET | Freq: Every day | ORAL | Status: DC

## 2018-08-04 MED ORDER — SODIUM CHLORIDE 0.9 % IV SOLN
INTRAVENOUS | Status: DC
  Administered 2018-08-04: 100 mL/h via INTRAVENOUS

## 2018-08-04 MED ORDER — PANTOPRAZOLE SODIUM 40 MG PO TBEC
40.0000 mg | DELAYED_RELEASE_TABLET | Freq: Every day | ORAL | Status: DC
  Administered 2018-08-05: 40 mg via ORAL
  Filled 2018-08-04: qty 1

## 2018-08-04 MED ORDER — ASPIRIN 81 MG PO CHEW
81.0000 mg | CHEWABLE_TABLET | Freq: Every day | ORAL | Status: DC
  Administered 2018-08-05: 81 mg via ORAL
  Filled 2018-08-04: qty 1

## 2018-08-04 MED ORDER — POTASSIUM CHLORIDE CRYS ER 20 MEQ PO TBCR
40.0000 meq | EXTENDED_RELEASE_TABLET | Freq: Once | ORAL | Status: DC
  Filled 2018-08-04: qty 2

## 2018-08-04 MED ORDER — ONDANSETRON 4 MG PO TBDP
4.0000 mg | ORAL_TABLET | Freq: Once | ORAL | Status: AC | PRN
  Administered 2018-08-04: 4 mg via ORAL
  Filled 2018-08-04: qty 1

## 2018-08-04 MED ORDER — INSULIN ASPART 100 UNIT/ML ~~LOC~~ SOLN
0.0000 [IU] | Freq: Three times a day (TID) | SUBCUTANEOUS | Status: DC
  Administered 2018-08-04: 3 [IU] via SUBCUTANEOUS
  Administered 2018-08-05: 2 [IU] via SUBCUTANEOUS
  Administered 2018-08-05: 3 [IU] via SUBCUTANEOUS

## 2018-08-04 MED ORDER — PROMETHAZINE HCL 25 MG/ML IJ SOLN
25.0000 mg | Freq: Once | INTRAMUSCULAR | Status: AC
  Administered 2018-08-04: 25 mg via INTRAVENOUS
  Filled 2018-08-04: qty 1

## 2018-08-04 MED ORDER — METOPROLOL SUCCINATE ER 25 MG PO TB24
25.0000 mg | ORAL_TABLET | Freq: Every day | ORAL | Status: DC
  Administered 2018-08-05: 25 mg via ORAL
  Filled 2018-08-04: qty 1

## 2018-08-04 MED ORDER — MORPHINE SULFATE (PF) 2 MG/ML IV SOLN
2.0000 mg | INTRAVENOUS | Status: DC | PRN
  Administered 2018-08-04 – 2018-08-05 (×2): 2 mg via INTRAVENOUS
  Filled 2018-08-04 (×2): qty 1

## 2018-08-04 MED ORDER — METOCLOPRAMIDE HCL 10 MG PO TABS
5.0000 mg | ORAL_TABLET | Freq: Three times a day (TID) | ORAL | Status: DC
  Filled 2018-08-04: qty 1

## 2018-08-04 MED ORDER — INSULIN ASPART 100 UNIT/ML ~~LOC~~ SOLN
10.0000 [IU] | SUBCUTANEOUS | Status: AC
  Administered 2018-08-04: 10 [IU] via SUBCUTANEOUS
  Filled 2018-08-04: qty 1

## 2018-08-04 MED ORDER — LACTATED RINGERS IV SOLN: INTRAVENOUS | Status: DC

## 2018-08-04 MED ORDER — PROMETHAZINE HCL 25 MG PO TABS
12.5000 mg | ORAL_TABLET | Freq: Four times a day (QID) | ORAL | Status: DC | PRN
  Administered 2018-08-04: 12.5 mg via ORAL
  Filled 2018-08-04 (×3): qty 1

## 2018-08-04 MED ORDER — ACETAMINOPHEN 650 MG RE SUPP: 650.0000 mg | Freq: Four times a day (QID) | RECTAL | Status: DC | PRN

## 2018-08-04 MED ORDER — INSULIN ASPART 100 UNIT/ML ~~LOC~~ SOLN: 15.0000 [IU] | SUBCUTANEOUS | Status: DC

## 2018-08-04 MED ORDER — POTASSIUM CHLORIDE 20 MEQ PO PACK
40.0000 meq | PACK | Freq: Once | ORAL | Status: DC
  Filled 2018-08-04: qty 2

## 2018-08-04 MED ORDER — BISACODYL 5 MG PO TBEC: 5.0000 mg | DELAYED_RELEASE_TABLET | Freq: Every day | ORAL | Status: DC | PRN

## 2018-08-04 MED ORDER — ACETAMINOPHEN 325 MG PO TABS: 650.0000 mg | ORAL_TABLET | Freq: Four times a day (QID) | ORAL | Status: DC | PRN

## 2018-08-04 MED ORDER — METOCLOPRAMIDE HCL 5 MG/ML IJ SOLN
10.0000 mg | Freq: Once | INTRAMUSCULAR | Status: AC
  Administered 2018-08-04: 10 mg via INTRAVENOUS
  Filled 2018-08-04: qty 2

## 2018-08-04 MED ORDER — ENOXAPARIN SODIUM 40 MG/0.4ML ~~LOC~~ SOLN
40.0000 mg | SUBCUTANEOUS | Status: DC
  Administered 2018-08-05: 40 mg via SUBCUTANEOUS
  Filled 2018-08-04: qty 0.4

## 2018-08-04 MED ORDER — DEXTROSE-NACL 5-0.45 % IV SOLN: INTRAVENOUS | Status: DC

## 2018-08-04 MED ORDER — INSULIN ASPART 100 UNIT/ML ~~LOC~~ SOLN: 0.0000 [IU] | Freq: Every day | SUBCUTANEOUS | Status: DC

## 2018-08-04 MED ORDER — INSULIN GLARGINE 100 UNIT/ML ~~LOC~~ SOLN
25.0000 [IU] | Freq: Every day | SUBCUTANEOUS | Status: DC
  Administered 2018-08-04: 25 [IU] via SUBCUTANEOUS
  Filled 2018-08-04: qty 0.25

## 2018-08-04 MED ORDER — ATORVASTATIN CALCIUM 20 MG PO TABS
80.0000 mg | ORAL_TABLET | Freq: Every day | ORAL | Status: DC
  Administered 2018-08-04: 80 mg via ORAL
  Filled 2018-08-04 (×2): qty 4

## 2018-08-04 MED ORDER — SODIUM CHLORIDE 0.9 % IV BOLUS: 1000.0000 mL | Freq: Once | INTRAVENOUS | Status: DC

## 2018-08-04 MED ORDER — POTASSIUM CHLORIDE CRYS ER 20 MEQ PO TBCR
20.0000 meq | EXTENDED_RELEASE_TABLET | Freq: Two times a day (BID) | ORAL | Status: AC
  Administered 2018-08-04: 20 meq via ORAL
  Filled 2018-08-04: qty 1

## 2018-08-04 MED ORDER — INSULIN ASPART 100 UNIT/ML ~~LOC~~ SOLN: 0.0000 [IU] | Freq: Three times a day (TID) | SUBCUTANEOUS | Status: DC

## 2018-08-04 MED ORDER — SERTRALINE HCL 50 MG PO TABS
50.0000 mg | ORAL_TABLET | Freq: Every day | ORAL | Status: DC
  Administered 2018-08-05: 50 mg via ORAL
  Filled 2018-08-04: qty 1

## 2018-08-04 MED ORDER — INSULIN ASPART 100 UNIT/ML ~~LOC~~ SOLN
0.0000 [IU] | Freq: Every day | SUBCUTANEOUS | Status: DC
  Administered 2018-08-04: 2 [IU] via SUBCUTANEOUS

## 2018-08-04 MED ORDER — LEVOTHYROXINE SODIUM 150 MCG PO TABS
150.0000 ug | ORAL_TABLET | Freq: Every day | ORAL | Status: DC
  Administered 2018-08-05: 150 ug via ORAL
  Filled 2018-08-04: qty 1

## 2018-08-04 NOTE — H&P (Signed)
History and Physical    [name] [name] RSW:[id number] DOB: March 31, 1961 DOA: 08/04/2018  PCP: Jinny Sanders, MD Patient coming from: home  Chief Complaint: nausea and vomiting  HPI: [name] [name] is a 57 y.o. female with medical history significant for chronic systolic heart failure, nonischemic cardiomyopathy, diabetes noncompliance, hypertension, hypothyroidism presents emergency Department chief complaint abdominal pain nausea and vomiting. Initial evaluation reveals hyperglycemia with a serum glucose 374 and an  anion gap of 18 CO2 22. [facility] are asked to admit  Information is obtained from the patient. She reports she was in her usual state of health until last evening she ate a hot dog for dinner. She reports compliance with her insulin but noncompliance with her diet especially soft drinks. She developed abdominal pain nausea vomiting. She states the pain is constant sharp located throughout her abdomen. She reports "countless" episodes of emesis and dry heaving. She denies any coffee ground emesis. She denies headache dizziness fever syncope or near-syncope. She denies chest pain palpitation shortness of breath lower extremity edema. She denies any dysuria hematuria frequency or urgency. She does report 2 episodes of loose stool. She denies melena bright red blood per rectum. She also complains of neck pain. She reports the pain started yesterday denies any trauma falls extreme physical activity. Describes the pain as on a can constant worse with movement. She denies any numbness or tingling of her arms or hands. He denies any weakness of her upper extremities as well.    ED Course: emergency department she's afebrile hemodynamically stable with a blood pressure high end of normal and tachycardia. She is not hypoxic. She is provided with IV fluids Phenergan potassium supplements. Insulin drip was discussed and discontinued. Lantus and SSI initiated  Review of Systems: As per HPI  otherwise all other systems reviewed and are negative.   Ambulatory Status: ambulates independantly is independent with ADL  Past Medical History:  Diagnosis Date  . Anxiety   . B12 deficiency   . Chest pain 04/05/2017  . CHF (congestive heart failure) ([place])   . DKA (diabetic ketoacidoses) ([place]) 05/25/2018  . GERD (gastroesophageal reflux disease)   . Hyperlipidemia   . Hypertension    [name]  . Hypothyroidism   . IBS (irritable bowel syndrome)   . Intermittent vertigo   . 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 Leg cramping    "@ night" (09/18/2013)  . Migraine    "once q couple months" (09/18/2013)  . Osteoporosis   . Peripheral neuropathy   . Type II diabetes mellitus ([place])   . Umbilical hernia    unrepaired (09/18/2013)    Past Surgical History:  Procedure Laterality Date  . CESAREAN SECTION  1989  . LAPAROSCOPIC ASSISTED VAGINAL HYSTERECTOMY  09/23/2012   Procedure: LAPAROSCOPIC ASSISTED VAGINAL HYSTERECTOMY;  Surgeon: Linda Hedges, DO;  Location: [facility];  Service: Gynecology;  Laterality: N/A;  pull Dr Gregor Hams instrument  . LEFT HEART CATH AND CORONARY ANGIOGRAPHY N/A 02/11/2017   Procedure: Left Heart Cath and Coronary Angiography;  Surgeon: Lorretta Harp, MD;  Location: [facility];  Service: Cardiovascular;  Laterality: N/A;  . TUBAL LIGATION Bilateral 1992    Social History   Socioeconomic History  . Marital status: Married    Spouse name: Not on file  . Number of children: 2  . Years of education: Not on file  . Highest education level: Not on file  Occupational History  . Occupation: UNEMPLOYED  Employer: UNEMPLOYED    Comment: homemaker  Social Needs  . Financial resource strain: Not on file  . Food insecurity:    Worry: Not on file    Inability: Not on file  . Transportation needs:    Medical: Not on file    Non-medical: Not on file  Tobacco Use  . Smoking status: Former Smoker    Years: 0.20    Types:  Cigarettes    Last attempt to quit: 12/10/1990    Years since quitting: 27.6  . Smokeless tobacco: Never Used  Substance and Sexual Activity  . Alcohol use: No    Alcohol/week: 0.0 standard drinks  . Drug use: Yes    Frequency: 3.0 times per week    Types: Marijuana  . Sexual activity: Yes  Lifestyle  . Physical activity:    Days per week: Not on file    Minutes per session: Not on file  . Stress: Not on file  Relationships  . Social connections:    Talks on phone: Not on file    Gets together: Not on file    Attends religious service: Not on file    Active member of club or organization: Not on file    Attends meetings of clubs or organizations: Not on file    Relationship status: Not on file  . Intimate partner violence:    Fear of current or ex partner: Not on file    Emotionally abused: Not on file    Physically abused: Not on file    Forced sexual activity: Not on file  Other Topics Concern  . Not on file  Social History Narrative   Regular exercise- no    Diet- lots of mountain dew, limits fast foods     Allergies  Allergen Reactions  . Codeine Hives, Anxiety and Other (See Comments)    Family History  Problem Relation Age of Onset  . Diabetes Other   . Heart disease Other   . Heart failure Mother 49       Her heart skipped a beat and stopped - per pt  . Diabetes Maternal Grandmother   . Alzheimer's disease Maternal Grandmother   . Coronary artery disease Maternal Grandmother   . Heart attack Maternal Grandfather   . Heart failure Brother     Prior to Admission medications   Medication Sig Start Date End Date Taking? Authorizing Provider  aspirin 81 MG chewable tablet Chew 1 tablet (81 mg total) by mouth daily. 02/12/17  Yes Jani Gravel, MD  atorvastatin (LIPITOR) 80 MG tablet Take 1 tablet (80 mg total) by mouth daily at 6 PM. 03/13/18  Yes Bedsole, Amy E, MD  Cholecalciferol (VITAMIN D3) 50000 units CAPS Take 1 capsule by mouth once a week. 07/22/18  Yes  Bedsole, Amy E, MD  ezetimibe (ZETIA) 10 MG tablet Take 1 tablet (10 mg total) by mouth daily. 09/11/16  Yes Bedsole, Amy E, MD  furosemide (LASIX) 20 MG tablet Take 1 tablet (20 mg total) by mouth daily. 07/04/18 10/02/18 Yes Minus Breeding, MD  insulin glargine (LANTUS) 100 UNIT/ML injection Inject 0.25 mLs (25 Units total) into the skin daily. 06/29/18  Yes Hosie Poisson, MD  insulin lispro (HUMALOG KWIKPEN) 100 UNIT/ML KiwkPen Inject into skin 3 times a day at meal time according to the following sliding scale: CBG 70-120 - 0 units / CBG 121-150 - 1 unit / CBG 151-200 - 2 units / CBG 201-250 - 3 units / CBG 251-300 - 5  units / CBG 301-350 - 7 units / CBG 351-400+ - 9 units  YOU MUST FOLLOW A STRICT DIABETIC DIET 07/01/18  Yes Bedsole, Amy E, MD  levothyroxine (SYNTHROID, LEVOTHROID) 150 MCG tablet TAKE 1 TABLET BY MOUTH EVERY DAY 06/03/18  Yes Bedsole, Amy E, MD  losartan (COZAAR) 25 MG tablet Take 25 mg by mouth daily. 05/27/17  Yes [provider]  metoCLOPramide (REGLAN) 5 MG tablet TAKE 1 TABLET BY MOUTH 4 TIMES A DAY BEFORE MEALS AND AT BEDTIME Patient taking differently: Take 5 mg by mouth 4 (four) times daily -  before meals and at bedtime. TAKE 1 TABLET BY MOUTH 4 TIMES A DAY BEFORE MEALS AND AT BEDTIME 05/09/18  Yes Bedsole, Amy E, MD  metoprolol succinate (TOPROL-XL) 25 MG 24 hr tablet Take 25 mg by mouth daily. 03/24/17  Yes [provider]  nitroGLYCERIN (NITRODUR - DOSED IN MG/24 HR) 0.2 mg/hr patch Place 1 patch (0.2 mg total) onto the skin daily as needed. Patient taking differently: Place 0.2 mg onto the skin daily.  12/14/17  Yes Regalado, Belkys A, MD  pantoprazole (PROTONIX) 40 MG tablet Take 1 tablet (40 mg total) by mouth daily at 12 noon. 01/28/18  Yes Bedsole, Amy E, MD  RELPAX 40 MG tablet ONE TABLET BY MOUTH AT ONSET OF HEADACHE. MAY REPEAT IN 2 HOURS IF HEADACHE PERSISTS OR RECURS. Patient taking differently: Take 40 mg by mouth every 2 (two) hours as  needed for migraine. One tablet by mouth at onset of headache. May repeat in 2 hours if headache persists or recurs. 10/17/16  Yes Bedsole, Amy E, MD  sertraline (ZOLOFT) 50 MG tablet TAKE 1 TABLET BY MOUTH EVERY DAY 06/19/18  Yes Bedsole, Amy E, MD  Insulin Pen Needle (PEN NEEDLES) 31G X 8 MM MISC Use to inject insulin 4 times a day.  Dx: E11.10 12/23/17   Jinny Sanders, MD  Lancets (ONETOUCH ULTRASOFT) lancets Use to check blood sugar daily as needed.  Dx: E11.10 12/23/17   Jinny Sanders, MD  ONETOUCH VERIO test strip USE TO CHECK BLOOD SUGAR DAILY AS NEEDED. DX: E11.10 06/25/18   [provider]    Physical Exam: Vitals:   08/04/18 0945 08/04/18 1000 08/04/18 1015 08/04/18 1030  BP: (!) 170/95  (!) 171/114 (!) 147/125  Pulse: 98 (!) 103 (!) 107 (!) 118  Resp: 12 15 (!) 21 17  Temp:      TempSrc:      SpO2: 98% 99% 99% 99%  Weight:      Height:         General:  Appears calm and only slightly uncomfortable Eyes:  PERRL, EOMI, normal lids, iris ENT:  grossly normal hearing, lips & tongue, mucous membranes of her mouth are pink and dry, very poor dentition Neck:  no LAD, masses or thyromegaly Cardiovascular:  Tachycardic but regular, no m/r/g. No LE edema.  Respiratory:  CTA bilaterally, no w/r/r. Normal respiratory effort. Abdomen:  soft, mild diffuse tenderness to palpation very sluggish bowel sounds no guarding or rebounding Skin:  no rash or induration seen on limited exam Musculoskeletal:  grossly normal tone BUE/BLE, good ROM, no bony abnormality Psychiatric:  grossly normal mood and affect, speech fluent and appropriate, AOx3 Neurologic:  CN 2-12 grossly intact, moves all extremities in coordinated fashion, sensation intact  Labs on Admission: I have personally reviewed following labs and imaging studies  CBC: Recent Labs  Lab 08/04/18 0705  WBC 16.3*  HGB 16.5*  HCT 47.1*  MCV 83.5  PLT 893   Basic Metabolic Panel: Recent Labs  Lab 08/04/18 0705  NA 138    K 3.4*  CL 100  CO2 20*  GLUCOSE 374*  BUN 9  CREATININE 0.68  CALCIUM 9.6   GFR: Estimated Creatinine Clearance: 67.9 mL/min (by C-G formula based on SCr of 0.68 mg/dL). Liver Function Tests: Recent Labs  Lab 08/04/18 0705  AST 22  ALT 19  ALKPHOS 107  BILITOT 1.0  PROT 7.6  ALBUMIN 4.2   Recent Labs  Lab 08/04/18 0705  LIPASE 48   No results for input(s): AMMONIA in the last 168 hours. Coagulation Profile: No results for input(s): INR, PROTIME in the last 168 hours. Cardiac Enzymes: No results for input(s): CKTOTAL, CKMB, CKMBINDEX, TROPONINI in the last 168 hours. BNP (last 3 results) No results for input(s): PROBNP in the last 8760 hours. HbA1C: No results for input(s): HGBA1C in the last 72 hours. CBG: Recent Labs  Lab 08/04/18 1054  GLUCAP 351*   Lipid Profile: No results for input(s): CHOL, HDL, LDLCALC, TRIG, CHOLHDL, LDLDIRECT in the last 72 hours. Thyroid Function Tests: No results for input(s): TSH, T4TOTAL, FREET4, T3FREE, THYROIDAB in the last 72 hours. Anemia Panel: No results for input(s): VITAMINB12, FOLATE, FERRITIN, TIBC, IRON, RETICCTPCT in the last 72 hours. Urine analysis:    Component Value Date/Time   COLORURINE STRAW (A) 08/04/2018 0651   APPEARANCEUR CLEAR 08/04/2018 0651   LABSPEC 1.032 (H) 08/04/2018 0651   PHURINE 6.0 08/04/2018 0651   GLUCOSEU >=500 (A) 08/04/2018 0651   HGBUR NEGATIVE 08/04/2018 0651   BILIRUBINUR NEGATIVE 08/04/2018 0651   BILIRUBINUR negative 09/11/2016 1038   KETONESUR 80 (A) 08/04/2018 0651   PROTEINUR 100 (A) 08/04/2018 0651   UROBILINOGEN 0.2 09/11/2016 1038   UROBILINOGEN 0.2 10/28/2013 1723   NITRITE NEGATIVE 08/04/2018 0651   LEUKOCYTESUR NEGATIVE 08/04/2018 0651    Creatinine Clearance: Estimated Creatinine Clearance: 67.9 mL/min (by C-G formula based on SCr of 0.68 mg/dL).  Sepsis Labs: @LABRCNTIP (procalcitonin:4,lacticidven:4) )No results found for this or any previous visit (from the  past 240 hour(s)).   Radiological Exams on Admission: Dg Cervical Spine Complete  Result Date: 08/04/2018 CLINICAL DATA:  Atraumatic neck pain. EXAM: CERVICAL SPINE - COMPLETE 4+ VIEW COMPARISON:  None. FINDINGS: Normal alignment of the cervical spine. Vertebral body heights are maintained. No evidence for a fracture. There is some bilateral facet arthropathy. Degenerative changes and bony encroachment of the right neural foramen at C4-C5. Mild disc space narrowing at C5-C6. Prevertebral soft tissues are within normal limits. Visualized lung apices are clear. IMPRESSION: Mild degenerative changes in the cervical spine. Bony encroachment of the right neural foramen at C4-C5. No acute abnormality. Electronically Signed   By: Markus Daft M.D.   On: 08/04/2018 07:59    EKG: Independently reviewed. Sinus tachycardia Right axis deviation RSR' in V1 or V2, probably normal variant nonspecific T waves.  Assessment/Plan Principal Problem:   Hyperglycemia Active Problems:   Essential hypertension   Gastroparesis due to DM ([name])   Diabetes ([name])   Hypokalemia   Chronic systolic heart failure ([place])   Noncompliance   Tachycardia   Hypothyroidism   B12 deficiency   #1. Hyperglycemia. Serum glucose 364. CO2 is 20 ABG with ph 7.37 PCO2 22.6. Provided with IV fluids. Insulin gtt ordered but cancelled by admitting.  -admit to tele -resume home Lantus now -10 units NovoLog stat on admission -Sliding scale insulin for optimal control -IV fluids -Clear liquids  for now -bmet at 2pm -Potassium supplements  #2. Nausea vomiting/gastroparesis. Patient with a history of same. This is her fourth admission in 6 months for same. She remains noncompliant with her diet. She is provided with Phenergan in the emergency department as well as Reglan -scheduled reglan -phenergan -ppi -advance diet as able  #3.hypokalemia. Mild. Likely related to above. -Replete -Recheck  #4. Hypertension/ tachycardia. Poor  control in the emergency department. Home medications include Lasix, metoprolol -continue home meds -We'll resume Lasix tomorrow -Monitor  #5.diabetes. Uncontrolled. See #1. -Diabetes coordinator  #6. Nonischemic cardiomyopathy/chronic systolic CHF. Appears dry. Home medications include Lasix. -IV fluids as noted above -Monitor intake and output -Obtain daily weights -Have plan to resume Lasix starting tomorrow  #7. Hypothyroidism -Resume Synthroid   DVT prophylaxis: lovenox  Code Status: full  Family Communication: none present  Disposition Plan: home   Consults called: diabetes coordinator  Admission status: obs. Hopefully discharge in am    [name] MD [facility]  If 7PM-7AM, please contact night-coverage [email or web address] Password [facility]  08/04/2018, 10:57 AM

## 2018-08-04 NOTE — ED Notes (Signed)
Pt returns from xray

## 2018-08-04 NOTE — ED Notes (Signed)
Black PA at bedsdie

## 2018-08-04 NOTE — ED Triage Notes (Signed)
Pt coming from home by ems. Pt ate a hotdog yestarday and began to throw up and have diarrhea no abdominal pain at this time. Pt cbg was 500 when she started at 7pm last night and currently cbg was 285

## 2018-08-04 NOTE — Progress Notes (Signed)
Inpatient Diabetes Program Recommendations  AACE/ADA: New Consensus Statement on Inpatient Glycemic Control (2019)  Target Ranges:  Prepandial:   less than 140 mg/dL      Peak postprandial:   less than 180 mg/dL (1-2 hours)      Critically ill patients:  140 - 180 mg/dL   Results for JERONDA, DON (MRN [id number]) as of 08/04/2018 12:42  Ref. Range 08/04/2018 10:54 08/04/2018 12:34  Glucose-Capillary Latest Ref Range: 70 - 99 mg/dL 351 (H) 285 (H)  Results for NATALIEE, SHURTZ (MRN [id number]) as of 08/04/2018 12:42  Ref. Range 02/02/2018 12:02 05/28/2018 05:35 08/04/2018 07:05  Hemoglobin A1C Latest Ref Range: 4.8 - 5.6 % 12.7 (H) 16.7 (H) 14.5 (H)   Review of Glycemic Control  Outpatient Diabetes medications: Lantus 25 units daily, Humalog 0-9 units TID with meals  Current orders for Inpatient glycemic control: Lantus 25 units daily, Novolog 0-15 units TID with meals, Novolog 0-5 units QHS  Inpatient Diabetes Program Recommendations: Insulin - Basal: Please consider decreasing Lantus to 22 units daily. Correction (SSI): Please consider decreasing Novolog to sensitive correction scale (0-9 units) TID with meals. Insulin - Meal Coverage: Please consider ordering Novolog 3 units TID with meals for meal coverage if patient eats at least 50% of meals. HgbA1C: A1C 14.5% on 08/04/18 which is slightly improved form 16.7% on 05/28/18.  NOTE: Patient is well known to Inpatient Diabetes team (5th admission in last 6 months). Diabetes Coordinator spoke with patient last non 05/28/18 during prior admission and strongly encouraged patient to ask PCP to refer her to an Endocrinologist for assistance with DM management.   Thanks, Barnie Alderman, RN, MSN, CDE Diabetes Coordinator Inpatient Diabetes Program [phone] (Team Pager from 8am to 5pm)

## 2018-08-04 NOTE — ED Notes (Signed)
Pt vomits 300 cc to bag

## 2018-08-04 NOTE — ED Provider Notes (Signed)
[facility] EMERGENCY DEPARTMENT Provider Note   CSN: [id number] Arrival date & time: 08/04/18  2297     History   Chief Complaint Chief Complaint  Patient presents with  . Emesis    HPI [name] [name] is a 57 y.o. female.  HPI  57 year old female presents with vomiting.  She states she ate a hotdog at a birthday party last night and about 10 minutes later started to feel poorly.  Has been having vomiting and diarrhea since.  No blood in either.  No abdominal pain, chest pain, shortness of breath.  She notes that earlier in the day yesterday when she first woke up she had some atraumatic mid neck pain in the posterior aspect.  Has never had that before.  No radiation of the pain or weakness/numbness in her extremities.  She was given ODT Zofran prior to me seeing her but states she still nauseated.  No fevers.  Past Medical History:  Diagnosis Date  . Anxiety   . B12 deficiency   . Chest pain 04/05/2017  . CHF (congestive heart failure) ([place])   . DKA (diabetic ketoacidoses) ([place]) 05/25/2018  . GERD (gastroesophageal reflux disease)   . Hyperlipidemia   . Hypertension    [name]  . Hypothyroidism   . IBS (irritable bowel syndrome)   . Intermittent vertigo   . 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 Leg cramping    "@ night" (09/18/2013)  . Migraine    "once q couple months" (09/18/2013)  . Osteoporosis   . Peripheral neuropathy   . Type II diabetes mellitus ([place])   . Umbilical hernia    unrepaired (09/18/2013)    Patient Active Problem List   Diagnosis Date Noted  . MDD (major depressive disorder), single episode, mild ([place]) 07/01/2018  . ARF (acute renal failure) ([place]) 05/26/2018  . Colitis 03/07/2018  . Chronic chest pain   . Chronic systolic heart failure ([place]) 01/14/2018  . Noncompliance 01/14/2018  . Migraines 12/11/2017  . DKA (diabetic ketoacidoses) ([place]) 12/11/2017  . Chest discomfort 09/18/2017  . DKA, type  1 ([place]) 09/17/2017  . Phrygian cap 06/20/2017  . Lower abdominal pain   . Colitis presumed infectious   . Nonischemic cardiomyopathy ([place]) 03/22/2017  . NSTEMI (non-ST elevated myocardial infarction) ([place]) 02/11/2017  . H. pylori infection 02/09/2017  . Hypomagnesemia 02/09/2017  . Gastroparesis due to DM ([place]) 01/22/2017  . AKI (acute kidney injury) ([place]) 07/24/2016  . Type 2 diabetes mellitus with ketoacidosis without coma, with long-term current use of insulin ([place]) 07/17/2016  . Atrial fibrillation ([place]) 02/01/2015  . 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 Hepatic lesion 09/18/2013  . Vitamin D deficiency 05/20/2012  . Essential hypertension 02/01/2011  . Osteoporosis 02/01/2011  . B12 deficiency 04/04/2010  . Peripheral neuropathy ([place]) 05/10/2009  . GERD 04/08/2009  . Generalized anxiety disorder 06/22/2008  . Hypothyroidism 04/29/2008  . Hyperlipidemia 04/29/2008  . Irritable bowel syndrome 04/29/2008    Past Surgical History:  Procedure Laterality Date  . CESAREAN SECTION  1989  . LAPAROSCOPIC ASSISTED VAGINAL HYSTERECTOMY  09/23/2012   Procedure: LAPAROSCOPIC ASSISTED VAGINAL HYSTERECTOMY;  Surgeon: Linda Hedges, DO;  Location: [facility];  Service: Gynecology;  Laterality: N/A;  pull Dr Gregor Hams instrument  . LEFT HEART CATH AND CORONARY ANGIOGRAPHY N/A 02/11/2017   Procedure: Left Heart Cath and Coronary Angiography;  Surgeon: Lorretta Harp, MD;  Location: [facility];  Service: Cardiovascular;  Laterality: N/A;  . TUBAL LIGATION Bilateral 1992     OB History    Gravida  2   Para  2   Term      Preterm      AB      Living        SAB      TAB      Ectopic      Multiple      Live Births               Home Medications    Prior to Admission medications   Medication Sig Start Date End Date Taking? Authorizing Provider  aspirin 81 MG chewable tablet Chew 1 tablet (81 mg total) by mouth daily. 02/12/17   Jani Gravel,  MD  atorvastatin (LIPITOR) 80 MG tablet Take 1 tablet (80 mg total) by mouth daily at 6 PM. 03/13/18   Bedsole, Amy E, MD  Cholecalciferol (VITAMIN D3) 50000 units CAPS Take 1 capsule by mouth once a week. 07/22/18   Bedsole, Amy E, MD  ezetimibe (ZETIA) 10 MG tablet Take 1 tablet (10 mg total) by mouth daily. 09/11/16   Bedsole, Amy E, MD  furosemide (LASIX) 20 MG tablet Take 1 tablet (20 mg total) by mouth daily. 07/04/18 10/02/18  Minus Breeding, MD  insulin glargine (LANTUS) 100 UNIT/ML injection Inject 0.25 mLs (25 Units total) into the skin daily. 06/29/18   Hosie Poisson, MD  insulin lispro (HUMALOG KWIKPEN) 100 UNIT/ML KiwkPen Inject into skin 3 times a day at meal time according to the following sliding scale: CBG 70-120 - 0 units / CBG 121-150 - 1 unit / CBG 151-200 - 2 units / CBG 201-250 - 3 units / CBG 251-300 - 5 units / CBG 301-350 - 7 units / CBG 351-400+ - 9 units  YOU MUST FOLLOW A STRICT DIABETIC DIET 07/01/18   Bedsole, Amy E, MD  Insulin Pen Needle (PEN NEEDLES) 31G X 8 MM MISC Use to inject insulin 4 times a day.  Dx: E11.10 12/23/17   Jinny Sanders, MD  Lancets (ONETOUCH ULTRASOFT) lancets Use to check blood sugar daily as needed.  Dx: E11.10 12/23/17   Bedsole, Amy E, MD  levothyroxine (SYNTHROID, LEVOTHROID) 150 MCG tablet TAKE 1 TABLET BY MOUTH EVERY DAY 06/03/18   Bedsole, Amy E, MD  losartan (COZAAR) 25 MG tablet Take 25 mg by mouth daily. 05/27/17   [provider]  metoCLOPramide (REGLAN) 5 MG tablet TAKE 1 TABLET BY MOUTH 4 TIMES A DAY BEFORE MEALS AND AT BEDTIME 05/09/18   Bedsole, Amy E, MD  metoprolol succinate (TOPROL-XL) 25 MG 24 hr tablet Take 25 mg by mouth daily. 03/24/17   [provider]  nitroGLYCERIN (NITRODUR - DOSED IN MG/24 HR) 0.2 mg/hr patch Place 1 patch (0.2 mg total) onto the skin daily as needed. Patient taking differently: Place 0.2 mg onto the skin daily.  12/14/17   Regalado, Cassie Freer, MD  ONETOUCH VERIO test strip USE TO CHECK BLOOD  SUGAR DAILY AS NEEDED. DX: E11.10 06/25/18   [provider]  pantoprazole (PROTONIX) 40 MG tablet Take 1 tablet (40 mg total) by mouth daily at 12 noon. 01/28/18   Bedsole, Amy E, MD  potassium chloride SA (K-DUR,KLOR-CON) 20 MEQ tablet Take 1 tablet (20 mEq total) by mouth daily. 05/29/18   Shelly Coss, MD  RELPAX 40 MG tablet ONE TABLET BY MOUTH AT ONSET OF HEADACHE. MAY REPEAT IN 2 HOURS IF HEADACHE PERSISTS OR  RECURS. 10/17/16   Bedsole, Amy E, MD  sertraline (ZOLOFT) 50 MG tablet TAKE 1 TABLET BY MOUTH EVERY DAY 06/19/18   Bedsole, Amy E, MD  Vitamin D, Ergocalciferol, (DRISDOL) 50000 units CAPS capsule TAKE 1 CAPSULE BY MOUTH EVERY 7 DAYS Patient taking differently: TAKE 1 CAPSULE BY MOUTH EVERY 7 DAYS Wednesdays 04/22/18   Jinny Sanders, MD    Family History Family History  Problem Relation Age of Onset  . Diabetes Other   . Heart disease Other   . Heart failure Mother 26       Her heart skipped a beat and stopped - per pt  . Diabetes Maternal Grandmother   . Alzheimer's disease Maternal Grandmother   . Coronary artery disease Maternal Grandmother   . Heart attack Maternal Grandfather   . Heart failure Brother     Social History Social History   Tobacco Use  . Smoking status: Former Smoker    Years: 0.20    Types: Cigarettes    Last attempt to quit: 12/10/1990    Years since quitting: 27.6  . Smokeless tobacco: Never Used  Substance Use Topics  . Alcohol use: No    Alcohol/week: 0.0 standard drinks  . Drug use: Yes    Frequency: 3.0 times per week    Types: Marijuana     Allergies   Codeine   Review of Systems Review of Systems  Constitutional: Negative for fever.  Respiratory: Negative for shortness of breath.   Cardiovascular: Negative for chest pain.  Gastrointestinal: Positive for diarrhea, nausea and vomiting. Negative for abdominal pain.  Musculoskeletal: Positive for neck pain.  Neurological: Negative for weakness and numbness.  All other  systems reviewed and are negative.    Physical Exam Updated Vital Signs BP (!) 154/89 (BP Location: Right Arm)   Pulse 97   Temp 97.8 F (36.6 C) (Oral)   Resp 20   Ht 5\' 1"  (1.549 m)   Wt 63.5 kg   LMP 08/11/2012   SpO2 100%   BMI 26.45 kg/m   Physical Exam  Constitutional: She is oriented to person, place, and time. She appears well-developed and well-nourished.  HENT:  Head: Normocephalic and atraumatic.  Right Ear: External ear normal.  Left Ear: External ear normal.  Nose: Nose normal.  Eyes: Right eye exhibits no discharge. Left eye exhibits no discharge.  Neck: Normal range of motion. Neck supple. Spinous process tenderness (mild) present.    Cardiovascular: Normal rate, regular rhythm and normal heart sounds.  Pulmonary/Chest: Effort normal and breath sounds normal.  Abdominal: Soft. She exhibits no distension. There is no tenderness.  Neurological: She is alert and oriented to person, place, and time.  5/5 strength in all 4 extremities.   Skin: Skin is warm and dry. She is not diaphoretic.  Nursing note and vitals reviewed.    ED Treatments / Results  Labs (all labs ordered are listed, but only abnormal results are displayed) Labs Reviewed  COMPREHENSIVE METABOLIC PANEL - Abnormal; Notable for the following components:      Result Value   Potassium 3.4 (*)    CO2 20 (*)    Glucose, Bld 374 (*)    Anion gap 18 (*)    All other components within normal limits  CBC - Abnormal; Notable for the following components:   WBC 16.3 (*)    RBC 5.64 (*)    Hemoglobin 16.5 (*)    HCT 47.1 (*)    All other components within normal  limits  URINALYSIS, ROUTINE W REFLEX MICROSCOPIC - Abnormal; Notable for the following components:   Color, Urine STRAW (*)    Specific Gravity, Urine 1.032 (*)    Glucose, UA >=500 (*)    Ketones, ur 80 (*)    Protein, ur 100 (*)    Bacteria, UA RARE (*)    All other components within normal limits  BASIC METABOLIC PANEL -  Abnormal; Notable for the following components:   Potassium 3.2 (*)    Glucose, Bld 242 (*)    Anion gap 17 (*)    All other components within normal limits  HEMOGLOBIN A1C - Abnormal; Notable for the following components:   Hgb A1c MFr Bld 14.5 (*)    All other components within normal limits  GLUCOSE, CAPILLARY - Abnormal; Notable for the following components:   Glucose-Capillary 285 (*)    All other components within normal limits  I-STAT VENOUS BLOOD GAS, ED - Abnormal; Notable for the following components:   pCO2, Ven 31.6 (*)    pO2, Ven 49.0 (*)    Bicarbonate 19.9 (*)    TCO2 21 (*)    Acid-base deficit 4.0 (*)    All other components within normal limits  CBG MONITORING, ED - Abnormal; Notable for the following components:   Glucose-Capillary 351 (*)    All other components within normal limits  LIPASE, BLOOD  I-STAT BETA HCG BLOOD, ED ([facility], [facility], [facility] ONLY)  I-STAT TROPONIN, ED  I-STAT CG4 LACTIC ACID, ED  I-STAT CG4 LACTIC ACID, ED    EKG EKG Interpretation  Date/Time:  Monday August 04 2018 06:54:58 EDT Ventricular Rate:  103 PR Interval:    QRS Duration: 86 QT Interval:  357 QTC Calculation: 468 R Axis:   101 Text Interpretation:  Sinus tachycardia Right axis deviation RSR' in V1 or V2, probably normal variant nonspecific T waves. Confirmed by Sherwood Gambler [phone]) on 08/04/2018 7:20:21 AM   Radiology No results found.  Procedures .Critical Care Performed by: Sherwood Gambler, MD Authorized by: Sherwood Gambler, MD   Critical care provider statement:    Critical care time (minutes):  30   Critical care time was exclusive of:  Separately billable procedures and treating other patients   Critical care was necessary to treat or prevent imminent or life-threatening deterioration of the following conditions:  Endocrine crisis and metabolic crisis   Critical care was time spent personally by me on the following activities:  Development of treatment plan with  patient or surrogate, discussions with consultants, evaluation of patient's response to treatment, examination of patient, obtaining history from patient or surrogate, ordering and performing treatments and interventions, ordering and review of laboratory studies, ordering and review of radiographic studies, pulse oximetry, re-evaluation of patient's condition and review of old charts   (including critical care time)  Medications Ordered in ED Medications  metoCLOPramide (REGLAN) injection 10 mg (has no administration in time range)  sodium chloride 0.9 % bolus 1,000 mL (has no administration in time range)  ondansetron (ZOFRAN-ODT) disintegrating tablet 4 mg (4 mg Oral Given 08/04/18 0657)     Initial Impression / Assessment and Plan / ED Course  I have reviewed the triage vital signs and the nursing notes.  Pertinent labs & imaging results that were available during my care of the patient were reviewed by me and considered in my medical decision making (see chart for details).     Patient is found to be in DKA.  Still having vomiting despite multiple  antiemetics.  Was given fluids, gently at first given her history of CHF.  However she also appears dehydrated.  We will also replace potassium both oral and IV as she will be started on insulin drip which will drive down her potassium.  Otherwise besides some mild tachycardia her vital signs are okay.  She will be admitted to the hospitalist service.  Final Clinical Impressions(s) / ED Diagnoses   Final diagnoses:  Diabetic ketoacidosis without coma associated with type 2 diabetes mellitus ([place])  Hypokalemia    ED Discharge Orders    None       Sherwood Gambler, MD 08/04/18 [id number]

## 2018-08-04 NOTE — ED Notes (Signed)
Pt made aware we need urine, but unable to go at this time.

## 2018-08-05 DIAGNOSIS — R739 Hyperglycemia, unspecified: Secondary | ICD-10-CM | POA: Diagnosis not present

## 2018-08-05 DIAGNOSIS — R112 Nausea with vomiting, unspecified: Secondary | ICD-10-CM

## 2018-08-05 LAB — CBC
HEMATOCRIT: 40.9 % (ref 36.0–46.0)
HEMOGLOBIN: 13.8 g/dL (ref 12.0–15.0)
MCH: 28.8 pg (ref 26.0–34.0)
MCHC: 33.7 g/dL (ref 30.0–36.0)
MCV: 85.2 fL (ref 78.0–100.0)
Platelets: 273 10*3/uL (ref 150–400)
RBC: 4.8 MIL/uL (ref 3.87–5.11)
RDW: 14.4 % (ref 11.5–15.5)
WBC: 11.8 10*3/uL — AB (ref 4.0–10.5)

## 2018-08-05 LAB — BASIC METABOLIC PANEL
ANION GAP: 9 (ref 5–15)
BUN: 10 mg/dL (ref 6–20)
CO2: 21 mmol/L — ABNORMAL LOW (ref 22–32)
Calcium: 8.2 mg/dL — ABNORMAL LOW (ref 8.9–10.3)
Chloride: 105 mmol/L (ref 98–111)
Creatinine, Ser: 0.56 mg/dL (ref 0.44–1.00)
GLUCOSE: 208 mg/dL — AB (ref 70–99)
POTASSIUM: 3.7 mmol/L (ref 3.5–5.1)
Sodium: 135 mmol/L (ref 135–145)

## 2018-08-05 LAB — GLUCOSE, CAPILLARY
GLUCOSE-CAPILLARY: 234 mg/dL — AB (ref 70–99)
Glucose-Capillary: 191 mg/dL — ABNORMAL HIGH (ref 70–99)
Glucose-Capillary: 157 mg/dL — ABNORMAL HIGH (ref 70–99)

## 2018-08-05 MED ORDER — INSULIN GLARGINE 100 UNIT/ML ~~LOC~~ SOLN: 24.0000 [IU] | Freq: Every day | SUBCUTANEOUS | Status: DC

## 2018-08-05 MED ORDER — FUROSEMIDE 20 MG PO TABS: 20.0000 mg | ORAL_TABLET | Freq: Every day | ORAL | 2 refills | Status: DC

## 2018-08-05 MED ORDER — PROMETHAZINE HCL 25 MG/ML IJ SOLN
12.5000 mg | Freq: Four times a day (QID) | INTRAMUSCULAR | Status: DC | PRN
  Administered 2018-08-05: 12.5 mg via INTRAVENOUS
  Filled 2018-08-05: qty 1

## 2018-08-05 MED ORDER — METOCLOPRAMIDE HCL 5 MG/ML IJ SOLN
5.0000 mg | Freq: Three times a day (TID) | INTRAMUSCULAR | Status: DC
  Administered 2018-08-05 (×2): 5 mg via INTRAVENOUS
  Filled 2018-08-05: qty 2

## 2018-08-05 NOTE — Discharge Summary (Signed)
[name] [name], is a 57 y.o. female  DOB 02-11-1961  MRN [id number].  Admission date:  08/04/2018  Admitting Physician  Karmen Bongo, MD  Discharge Date:  08/05/2018   Primary MD  Jinny Sanders, MD  Recommendations for primary care physician for things to follow:  -Check CBC, BMP during next visit, continue counseling about diet compliance, medication compliance, given A1c of 14.5   Admission Diagnosis  emesis    Discharge Diagnosis  emesis     Principal Problem:   Hyperglycemia Active Problems:   Hypothyroidism   B12 deficiency   Essential hypertension   Diabetes ([place])   Gastroparesis due to DM ([place])   Hypokalemia   Chronic systolic heart failure ([place])   Noncompliance   Tachycardia      Past Medical History:  Diagnosis Date  . Anxiety   . B12 deficiency   . Chest pain 04/05/2017  . CHF (congestive heart failure) ([place])   . DKA (diabetic ketoacidoses) ([place]) 05/25/2018  . GERD (gastroesophageal reflux disease)   . Hyperlipidemia   . Hypertension    [name]  . Hypothyroidism   . IBS (irritable bowel syndrome)   . Intermittent vertigo   . 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 Leg cramping    "@ night" (09/18/2013)  . Migraine    "once q couple months" (09/18/2013)  . Osteoporosis   . Peripheral neuropathy   . Type II diabetes mellitus ([place])   . Umbilical hernia    unrepaired (09/18/2013)    Past Surgical History:  Procedure Laterality Date  . CESAREAN SECTION  1989  . LAPAROSCOPIC ASSISTED VAGINAL HYSTERECTOMY  09/23/2012   Procedure: LAPAROSCOPIC ASSISTED VAGINAL HYSTERECTOMY;  Surgeon: Linda Hedges, DO;  Location: [facility];  Service: Gynecology;  Laterality: N/A;  pull Dr Gregor Hams instrument  . LEFT HEART CATH AND CORONARY ANGIOGRAPHY N/A 02/11/2017   Procedure: Left Heart Cath and Coronary Angiography;  Surgeon: Lorretta Harp, MD;  Location: [facility];  Service: Cardiovascular;  Laterality: N/A;  . TUBAL LIGATION Bilateral 1992       History of present illness and  Hospital Course:     Kindly see H&P for history of present illness and admission details, please review complete Labs, Consult reports and Test reports for all details in brief  HPI  from the history and physical done on the day of admission   HPI: SYNDEY [name] is a 57 y.o. female with medical history significant for chronic systolic heart failure, nonischemic cardiomyopathy, diabetes noncompliance, hypertension, hypothyroidism presents emergency Department chief complaint abdominal pain nausea and vomiting. Initial evaluation reveals hyperglycemia with a serum glucose 374 and an  anion gap of 18 CO2 22. [facility] are asked to admit  Information is obtained from the patient. She reports she was in her usual state of health until last evening she ate a hot dog for dinner. She reports compliance with her insulin but noncompliance with her diet especially soft drinks. She developed abdominal  pain nausea vomiting. She states the pain is constant sharp located throughout her abdomen. She reports "countless" episodes of emesis and dry heaving. She denies any coffee ground emesis. She denies headache dizziness fever syncope or near-syncope. She denies chest pain palpitation shortness of breath lower extremity edema. She denies any dysuria hematuria frequency or urgency. She does report 2 episodes of loose stool. She denies melena bright red blood per rectum. She also complains of neck pain. She reports the pain started yesterday denies any trauma falls extreme physical activity. Describes the pain as on a can constant worse with movement. She denies any numbness or tingling of her arms or hands. He denies any weakness of her upper extremities as well.    ED Course: emergency department she's afebrile hemodynamically stable with a blood pressure high end of  normal and tachycardia. She is not hypoxic. She is provided with IV fluids Phenergan potassium supplements. Insulin drip was discussed and discontinued. Lantus and SSI initiated  Hospital Course   Hyperglycemia/diabetes mellitus uncontrolled -Blood glucose significantly elevated at 360 on admission, anion gap was 18, but pH within normal limits, PCO2 of 20, no requirement for insulin GTT, she was started on insulin sliding scale, and home dose Lantus, overall acceptable control, this morning her CBG in the 200s, so I will increase Lantus to 25units subcutaneous daily, which is her home dose  Nausea and vomiting  -Is most likely in the setting of gastroparesis, even though she had a gastric empty study April of last year, but this was more than 95-month ago, and overall her A1c significantly elevated at 14.5, she is significantly nauseous this morning, has been transitioned to IV Reglan and Phenergan, significant improvement, reports nausea has subsided, she is eager to go home today, I offered her to stay another day, but she wanted to leave,  instructed her to continue with clear liquid diet, advance as tolerated, and smaller portions more frequent meals, was given aggressive IV hydration during hospital stay, no signs of volume overload at time of discharge.   hypokalemia. Mild. -T repleted with IV fluid  Hypertension/ tachycardia. -Tachycardia significantly improved after appropriate hydration  -Blood pressure is acceptable   Nonischemic cardiomyopathy/chronic systolic CHF.  Most Recent echo 2018 with EF 30 to 35% -IV fluids as noted above -Monitor intake and output -She is instructed to resume Lasix in couple days  Hypothyroidism -Resume Synthroid     Discharge Condition:  stable   Follow UP  Follow-up Information    Bedsole, Amy E, MD Follow up in 1 week(s).   Specialty:  Family Medicine Contact information: [address] [place]  [address] [phone]             Discharge Instructions  and  Discharge Medications    Discharge Instructions    Discharge instructions   Complete by:  As directed    Follow with Primary MD Jinny Sanders, MD in 7 days   Get CBC, CMP,checked  by Primary MD next visit.    Activity: As tolerated with Full fall precautions use walker/cane & assistance as needed   Disposition Home    Diet: Heart Healthy, carbohydrate modified, with feeding assistance and aspiration precautions.  For Heart failure patients - Check your Weight same time everyday, if you gain over 2 pounds, or you develop in leg swelling, experience more shortness of breath or chest pain, call your Primary MD immediately. Follow Cardiac Low Salt Diet and 1.5 lit/day fluid restriction.  On your next visit with your primary care physician please Get Medicines reviewed and adjusted.   Please request your Prim.MD to go over all Hospital Tests and Procedure/Radiological results at the follow up, please get all Hospital records sent to your Prim MD by signing hospital release before you go home.   If you experience worsening of your admission symptoms, develop shortness of breath, life threatening emergency, suicidal or homicidal thoughts you must seek medical attention immediately by calling 911 or calling your MD immediately  if symptoms less severe.  You Must read complete instructions/literature along with all the possible adverse reactions/side effects for all the Medicines you take and that have been prescribed to you. Take any new Medicines after you have completely understood and accpet all the possible adverse reactions/side effects.   Do not drive, operating heavy machinery, perform activities at heights, swimming or participation in water activities or provide baby sitting services if your were admitted for syncope or siezures until you have seen by Primary MD or a Neurologist and advised to do so again.  Do  not drive when taking Pain medications.    Do not take more than prescribed Pain, Sleep and Anxiety Medications  Special Instructions: If you have smoked or chewed Tobacco  in the last 2 yrs please stop smoking, stop any regular Alcohol  and or any Recreational drug use.  Wear Seat belts while driving.   Please note  You were cared for by a hospitalist during your hospital stay. If you have any questions about your discharge medications or the care you received while you were in the hospital after you are discharged, you can call the unit and asked to speak with the hospitalist on call if the hospitalist that took care of you is not available. Once you are discharged, your primary care physician will handle any further medical issues. Please note that NO REFILLS for any discharge medications will be authorized once you are discharged, as it is imperative that you return to your primary care physician (or establish a relationship with a primary care physician if you do not have one) for your aftercare needs so that they can reassess your need for medications and monitor your lab values.   Increase activity slowly   Complete by:  As directed      Allergies as of 08/05/2018      Reactions   Codeine Hives, Anxiety, Other (See Comments)      Medication List    TAKE these medications   aspirin 81 MG chewable tablet Chew 1 tablet (81 mg total) by mouth daily.   atorvastatin 80 MG tablet Commonly known as:  LIPITOR Take 1 tablet (80 mg total) by mouth daily at 6 PM.   ezetimibe 10 MG tablet Commonly known as:  ZETIA Take 1 tablet (10 mg total) by mouth daily.   furosemide 20 MG tablet Commonly known as:  LASIX Take 1 tablet (20 mg total) by mouth daily. Hold for the next 2 days, then resume on 08/08/2018 What changed:  additional instructions   insulin glargine 100 UNIT/ML injection Commonly known as:  LANTUS Inject 0.25 mLs (25 Units total) into the skin daily.   insulin lispro  100 UNIT/ML KiwkPen Commonly known as:  HUMALOG Inject into skin 3 times a day at meal time according to the following sliding scale: CBG 70-120 - 0 units / CBG 121-150 - 1 unit / CBG 151-200 - 2 units / CBG 201-250 - 3 units / CBG  251-300 - 5 units / CBG 301-350 - 7 units / CBG 351-400+ - 9 units  YOU MUST FOLLOW A STRICT DIABETIC DIET   levothyroxine 150 MCG tablet Commonly known as:  SYNTHROID, LEVOTHROID TAKE 1 TABLET BY MOUTH EVERY DAY   losartan 25 MG tablet Commonly known as:  COZAAR Take 25 mg by mouth daily.   metoCLOPramide 5 MG tablet Commonly known as:  REGLAN TAKE 1 TABLET BY MOUTH 4 TIMES A DAY BEFORE MEALS AND AT BEDTIME What changed:  See the new instructions.   metoprolol succinate 25 MG 24 hr tablet Commonly known as:  TOPROL-XL Take 25 mg by mouth daily.   nitroGLYCERIN 0.2 mg/hr patch Commonly known as:  NITRODUR - Dosed in mg/24 hr Place 1 patch (0.2 mg total) onto the skin daily as needed. What changed:  when to take this   onetouch ultrasoft lancets Use to check blood sugar daily as needed.  Dx: E11.10   ONETOUCH VERIO test strip Generic drug:  glucose blood USE TO CHECK BLOOD SUGAR DAILY AS NEEDED. DX: E11.10   pantoprazole 40 MG tablet Commonly known as:  PROTONIX Take 1 tablet (40 mg total) by mouth daily at 12 noon.   Pen Needles 31G X 8 MM Misc Use to inject insulin 4 times a day.  Dx: E11.10   RELPAX 40 MG tablet Generic drug:  eletriptan ONE TABLET BY MOUTH AT ONSET OF HEADACHE. MAY REPEAT IN 2 HOURS IF HEADACHE PERSISTS OR RECURS. What changed:  See the new instructions.   sertraline 50 MG tablet Commonly known as:  ZOLOFT TAKE 1 TABLET BY MOUTH EVERY DAY   Vitamin D3 50000 units Caps Take 1 capsule by mouth once a week.         Diet and Activity recommendation: See Discharge Instructions above   Consults obtained -  None   Major procedures and Radiology Reports - PLEASE review detailed and final reports for all  details, in brief -      Dg Cervical Spine Complete  Result Date: 08/04/2018 CLINICAL DATA:  Atraumatic neck pain. EXAM: CERVICAL SPINE - COMPLETE 4+ VIEW COMPARISON:  None. FINDINGS: Normal alignment of the cervical spine. Vertebral body heights are maintained. No evidence for a fracture. There is some bilateral facet arthropathy. Degenerative changes and bony encroachment of the right neural foramen at C4-C5. Mild disc space narrowing at C5-C6. Prevertebral soft tissues are within normal limits. Visualized lung apices are clear. IMPRESSION: Mild degenerative changes in the cervical spine. Bony encroachment of the right neural foramen at C4-C5. No acute abnormality. Electronically Signed   By: Markus Daft M.D.   On: 08/04/2018 07:59    Micro Results    No results found for this or any previous visit (from the past 240 hour(s)).     Today   Subjective:   Kayden Heavin today has no headache,no chest pain, had some abdominal pain this morning, she did report some nausea this morning, but did improve . Objective:   Blood pressure 129/78, pulse 80, temperature 98.6 F (37 C), temperature source Oral, resp. rate 18, height 5\' 1"  (1.549 m), weight 63.5 kg, last menstrual period 08/11/2012, SpO2 98 %.   Intake/Output Summary (Last 24 hours) at 08/05/2018 1617 Last data filed at 08/05/2018 1230 Gross per 24 hour  Intake 2162 ml  Output 1050 ml  Net 1112 ml    Exam Awake Alert, Oriented x 3, No new F.N deficits, Normal affect Symmetrical Chest wall movement, Good air movement  bilaterally, CTAB RRR,No Gallops,Rubs or new Murmurs, No Parasternal Heave +ve B.Sounds, Abd Soft, have some epigastric tenderness no rebound -guarding or rigidity. No Cyanosis, Clubbing or edema, No new Rash or bruise  Data Review   CBC w Diff:  Lab Results  Component Value Date   WBC 11.8 (H) 08/05/2018   HGB 13.8 08/05/2018   HCT 40.9 08/05/2018   PLT 273 08/05/2018   LYMPHOPCT 9 05/26/2018   MONOPCT  6 05/26/2018   EOSPCT 0 05/26/2018   BASOPCT 0 05/26/2018    CMP:  Lab Results  Component Value Date   NA 135 08/05/2018   K 3.7 08/05/2018   CL 105 08/05/2018   CO2 21 (L) 08/05/2018   BUN 10 08/05/2018   CREATININE 0.56 08/05/2018   CREATININE 0.66 12/20/2017   PROT 7.6 08/04/2018   ALBUMIN 4.2 08/04/2018   BILITOT 1.0 08/04/2018   ALKPHOS 107 08/04/2018   AST 22 08/04/2018   ALT 19 08/04/2018  .   Total Time in preparing paper work, data evaluation and todays exam - 79 minutes  Phillips Climes M.D on 08/05/2018 at 4:17 PM  [facility]   Office  [phone]

## 2018-08-05 NOTE — Progress Notes (Signed)
PROGRESS NOTE                                                                                                                                                                                                             Patient Demographics:    [name] [name], is a 57 y.o. female, DOB - 03/09/1961, JEH:[id number]  Admit date - 08/04/2018   Admitting Physician Karmen Bongo, MD  Outpatient Primary MD for the patient is Jinny Sanders, MD  LOS - 0   Chief Complaint  Patient presents with  . Emesis       Brief Narrative    57 y.o. female with a Past Medical History of DM; stage 2 CKD; hypothyroidism; HTN; HLD; and CHF (EF 30-35% in 10/18) who presents with n/v.  She has recurrent admissions for this issue   Subjective:    [name] today has, No headache, No chest pain, still reports some nausea, and abdominal pain  Assessment  & Plan :    Principal Problem:   Hyperglycemia Active Problems:   Hypothyroidism   B12 deficiency   Essential hypertension   Diabetes ([name])   Gastroparesis due to DM ([name])   Hypokalemia   Chronic systolic heart failure ([name])   Noncompliance   Tachycardia   Hyperglycemia.  -Blood glucose significantly elevated at 360 on admission, anion gap was 18, but pH within normal limits, PCO2 of 20, no requirement for insulin GTT, she was started on insulin sliding scale, and home dose Lantus, overall acceptable control, this morning her CBG in the 200s, so I will increase Lantus to 24 units subcu daily.  Nausea and vomiting  -Is most likely in the setting of gastroparesis, even though she had a gastric empty study April of last year, but this was more than 19-month ago, and overall her A1c significantly elevated at 14.5, she is significantly nauseous this morning, cannot tolerate any liquids in the morning, so I changed her Phenergan to IV as needed, and scheduled Zofran to IV, will keep on clear liquid diet today, he  received aggressive IV fluid hydration overnight, I will DC now given known EF 30 to 35%.  hypokalemia. Mild. -Treating with IV fluids  Hypertension/ tachycardia. -Tachycardia significantly improved after appropriate hydration  -Blood pressure is acceptable   Nonischemic cardiomyopathy/chronic systolic CHF.  Most Recent echo 2018  with EF 30 to 35% -IV fluids as noted above -Monitor intake and output -We will hold on resuming Lasix for now, but I will DC her fluids today  Hypothyroidism -Resume Synthroid    Code Status : Full code  Family Communication  : None at bedside  Disposition Plan  : Home when stable  Consults  :  None  Procedures  : None  DVT Prophylaxis  :  [place] lovenox  Lab Results  Component Value Date   PLT 273 08/05/2018    Antibiotics  :    Anti-infectives (From admission, onward)   None        Objective:   Vitals:   08/04/18 1707 08/04/18 2110 08/05/18 0340 08/05/18 0747  BP: 136/84 109/69 112/72 129/78  Pulse: (!) 105 90 91 80  Resp: 18 18 16 18   Temp: 99.1 F (37.3 C) 98.5 F (36.9 C) 98.7 F (37.1 C) 98.6 F (37 C)  TempSrc: Oral Oral Oral Oral  SpO2: 96% 97% 98% 98%  Weight:      Height:        Wt Readings from Last 3 Encounters:  08/04/18 63.5 kg  07/04/18 63.9 kg  07/01/18 63.7 kg     Intake/Output Summary (Last 24 hours) at 08/05/2018 1457 Last data filed at 08/05/2018 1230 Gross per 24 hour  Intake 2396.86 ml  Output 1250 ml  Net 1146.86 ml     Physical Exam  Awake Alert, Oriented X 3, laying in bed, uncomfortable Symmetrical Chest wall movement, Good air movement bilaterally, CTAB RRR,No Gallops,Rubs or new Murmurs, No Parasternal Heave +ve B.Sounds, Abd Soft, some epigastric discomfort, No rebound - guarding or rigidity. No Cyanosis, Clubbing or edema, No new Rash or bruise      Data Review:    CBC Recent Labs  Lab 08/04/18 0705 08/05/18 0415  WBC 16.3* 11.8*  HGB 16.5* 13.8  HCT 47.1* 40.9  PLT  335 273  MCV 83.5 85.2  MCH 29.3 28.8  MCHC 35.0 33.7  RDW 13.8 14.4    Chemistries  Recent Labs  Lab 08/04/18 0705 08/04/18 1419 08/05/18 0415  NA 138 140 135  K 3.4* 3.2* 3.7  CL 100 101 105  CO2 20* 22 21*  GLUCOSE 374* 242* 208*  BUN 9 8 10   CREATININE 0.68 0.65 0.56  CALCIUM 9.6 9.0 8.2*  AST 22  --   --   ALT 19  --   --   ALKPHOS 107  --   --   BILITOT 1.0  --   --    ------------------------------------------------------------------------------------------------------------------ No results for input(s): CHOL, HDL, LDLCALC, TRIG, CHOLHDL, LDLDIRECT in the last 72 hours.  Lab Results  Component Value Date   HGBA1C 14.5 (H) 08/04/2018   ------------------------------------------------------------------------------------------------------------------ No results for input(s): TSH, T4TOTAL, T3FREE, THYROIDAB in the last 72 hours.  Invalid input(s): FREET3 ------------------------------------------------------------------------------------------------------------------ No results for input(s): VITAMINB12, FOLATE, FERRITIN, TIBC, IRON, RETICCTPCT in the last 72 hours.  Coagulation profile No results for input(s): INR, PROTIME in the last 168 hours.  No results for input(s): DDIMER in the last 72 hours.  Cardiac Enzymes No results for input(s): CKMB, TROPONINI, MYOGLOBIN in the last 168 hours.  Invalid input(s): CK ------------------------------------------------------------------------------------------------------------------ No results found for: BNP  Inpatient Medications  Scheduled Meds: . aspirin  81 mg Oral Daily  . atorvastatin  80 mg Oral q1800  . enoxaparin (LOVENOX) injection  40 mg Subcutaneous Q24H  . insulin aspart  0-5 Units Subcutaneous QHS  . insulin  aspart  0-9 Units Subcutaneous TID WC  . insulin glargine  22 Units Subcutaneous Daily  . levothyroxine  150 mcg Oral QAC breakfast  . losartan  25 mg Oral Daily  . metoCLOPramide (REGLAN)  injection  5 mg Intravenous Q8H  . metoprolol succinate  25 mg Oral Daily  . nitroGLYCERIN  0.2 mg Transdermal Daily  . pantoprazole  40 mg Oral Q1200  . sertraline  50 mg Oral Daily   Continuous Infusions: . 0.9 % NaCl with KCl 20 mEq / L 100 mL/hr at 08/05/18 0923   PRN Meds:.acetaminophen **OR** acetaminophen, bisacodyl, eletriptan, promethazine  Micro Results No results found for this or any previous visit (from the past 240 hour(s)).  Radiology Reports Dg Cervical Spine Complete  Result Date: 08/04/2018 CLINICAL DATA:  Atraumatic neck pain. EXAM: CERVICAL SPINE - COMPLETE 4+ VIEW COMPARISON:  None. FINDINGS: Normal alignment of the cervical spine. Vertebral body heights are maintained. No evidence for a fracture. There is some bilateral facet arthropathy. Degenerative changes and bony encroachment of the right neural foramen at C4-C5. Mild disc space narrowing at C5-C6. Prevertebral soft tissues are within normal limits. Visualized lung apices are clear. IMPRESSION: Mild degenerative changes in the cervical spine. Bony encroachment of the right neural foramen at C4-C5. No acute abnormality. Electronically Signed   By: Markus Daft M.D.   On: 08/04/2018 07:59    Phillips Climes M.D on 08/05/2018 at 2:57 PM  Between 7am to 7pm - Pager - [phone]  After 7pm go to [email or web address] - password [facility]  [facility] -  Office  [phone]

## 2018-08-05 NOTE — Discharge Instructions (Signed)
Follow with Primary MD Jinny Sanders, MD in 7 days   Get CBC, CMP,checked  by Primary MD next visit.    Activity: As tolerated with Full fall precautions use walker/cane & assistance as needed   Disposition Home    Diet: Heart Healthy, carbohydrate modified, with feeding assistance and aspiration precautions.  For Heart failure patients - Check your Weight same time everyday, if you gain over 2 pounds, or you develop in leg swelling, experience more shortness of breath or chest pain, call your Primary MD immediately. Follow Cardiac Low Salt Diet and 1.5 lit/day fluid restriction.   On your next visit with your primary care physician please Get Medicines reviewed and adjusted.   Please request your Prim.MD to go over all Hospital Tests and Procedure/Radiological results at the follow up, please get all Hospital records sent to your Prim MD by signing hospital release before you go home.   If you experience worsening of your admission symptoms, develop shortness of breath, life threatening emergency, suicidal or homicidal thoughts you must seek medical attention immediately by calling 911 or calling your MD immediately  if symptoms less severe.  You Must read complete instructions/literature along with all the possible adverse reactions/side effects for all the Medicines you take and that have been prescribed to you. Take any new Medicines after you have completely understood and accpet all the possible adverse reactions/side effects.   Do not drive, operating heavy machinery, perform activities at heights, swimming or participation in water activities or provide baby sitting services if your were admitted for syncope or siezures until you have seen by Primary MD or a Neurologist and advised to do so again.  Do not drive when taking Pain medications.    Do not take more than prescribed Pain, Sleep and Anxiety Medications  Special Instructions: If you have smoked or chewed Tobacco  in  the last 2 yrs please stop smoking, stop any regular Alcohol  and or any Recreational drug use.  Wear Seat belts while driving.   Please note  You were cared for by a hospitalist during your hospital stay. If you have any questions about your discharge medications or the care you received while you were in the hospital after you are discharged, you can call the unit and asked to speak with the hospitalist on call if the hospitalist that took care of you is not available. Once you are discharged, your primary care physician will handle any further medical issues. Please note that NO REFILLS for any discharge medications will be authorized once you are discharged, as it is imperative that you return to your primary care physician (or establish a relationship with a primary care physician if you do not have one) for your aftercare needs so that they can reassess your need for medications and monitor your lab values.

## 2018-08-05 NOTE — Progress Notes (Signed)
Patient ambulated 100 feet with no difficulties. Dorthey Sawyer, RN

## 2018-08-05 NOTE — Progress Notes (Signed)
Patient discharged to home. After visit summary reviewed and patient capable of re verbalizing medications and follow up appointments. Pt remains stable. No signs and symptoms of distress. Educated to return to ER in the event of SOB, dizziness, chest pain, or fainting. Kourtlyn Charlet, RN  

## 2018-08-06 ENCOUNTER — Telehealth: Payer: Self-pay | Admitting: *Deleted

## 2018-08-06 NOTE — Telephone Encounter (Signed)
Lm requesting return call to complete TCM and confirm hosp f/u appt  

## 2018-08-07 NOTE — Telephone Encounter (Signed)
Lm requesting return call to complete TCM and confirm hosp f/u appt CRM created. Please schedule pt hospital f/u should she return call.

## 2018-08-12 ENCOUNTER — Other Ambulatory Visit: Payer: Self-pay | Admitting: Family Medicine

## 2018-08-27 ENCOUNTER — Other Ambulatory Visit: Payer: Self-pay | Admitting: Cardiology

## 2018-09-11 ENCOUNTER — Emergency Department (HOSPITAL_COMMUNITY): Payer: 59

## 2018-09-11 ENCOUNTER — Observation Stay (HOSPITAL_COMMUNITY)
Admission: EM | Admit: 2018-09-11 | Discharge: 2018-09-12 | Disposition: A | Discharge status: Home / Self Care | Payer: 59 | Attending: Internal Medicine | Admitting: Internal Medicine

## 2018-09-11 ENCOUNTER — Encounter (HOSPITAL_COMMUNITY): Payer: Self-pay | Admitting: Emergency Medicine

## 2018-09-11 ENCOUNTER — Other Ambulatory Visit: Payer: Self-pay

## 2018-09-11 DIAGNOSIS — E039 Hypothyroidism, unspecified: Secondary | ICD-10-CM | POA: Insufficient documentation

## 2018-09-11 DIAGNOSIS — K219 Gastro-esophageal reflux disease without esophagitis: Secondary | ICD-10-CM | POA: Insufficient documentation

## 2018-09-11 DIAGNOSIS — Z79899 Other long term (current) drug therapy: Secondary | ICD-10-CM | POA: Diagnosis not present

## 2018-09-11 DIAGNOSIS — K3184 Gastroparesis: Secondary | ICD-10-CM | POA: Diagnosis not present

## 2018-09-11 DIAGNOSIS — Z794 Long term (current) use of insulin: Secondary | ICD-10-CM | POA: Diagnosis not present

## 2018-09-11 DIAGNOSIS — I10 Essential (primary) hypertension: Secondary | ICD-10-CM | POA: Diagnosis not present

## 2018-09-11 DIAGNOSIS — D72829 Elevated white blood cell count, unspecified: Secondary | ICD-10-CM | POA: Diagnosis not present

## 2018-09-11 DIAGNOSIS — Z23 Encounter for immunization: Secondary | ICD-10-CM | POA: Diagnosis not present

## 2018-09-11 DIAGNOSIS — I5042 Chronic combined systolic (congestive) and diastolic (congestive) heart failure: Secondary | ICD-10-CM | POA: Insufficient documentation

## 2018-09-11 DIAGNOSIS — I5022 Chronic systolic (congestive) heart failure: Secondary | ICD-10-CM | POA: Diagnosis present

## 2018-09-11 DIAGNOSIS — E1122 Type 2 diabetes mellitus with diabetic chronic kidney disease: Secondary | ICD-10-CM | POA: Diagnosis not present

## 2018-09-11 DIAGNOSIS — N182 Chronic kidney disease, stage 2 (mild): Secondary | ICD-10-CM | POA: Diagnosis not present

## 2018-09-11 DIAGNOSIS — Z91199 Patient's noncompliance with other medical treatment and regimen due to unspecified reason: Secondary | ICD-10-CM

## 2018-09-11 DIAGNOSIS — Z9119 Patient's noncompliance with other medical treatment and regimen: Secondary | ICD-10-CM | POA: Insufficient documentation

## 2018-09-11 DIAGNOSIS — M810 Age-related osteoporosis without current pathological fracture: Secondary | ICD-10-CM | POA: Insufficient documentation

## 2018-09-11 DIAGNOSIS — Z87891 Personal history of nicotine dependence: Secondary | ICD-10-CM | POA: Diagnosis not present

## 2018-09-11 DIAGNOSIS — E538 Deficiency of other specified B group vitamins: Secondary | ICD-10-CM | POA: Insufficient documentation

## 2018-09-11 DIAGNOSIS — K429 Umbilical hernia without obstruction or gangrene: Secondary | ICD-10-CM | POA: Diagnosis not present

## 2018-09-11 DIAGNOSIS — Z7982 Long term (current) use of aspirin: Secondary | ICD-10-CM | POA: Insufficient documentation

## 2018-09-11 DIAGNOSIS — E1110 Type 2 diabetes mellitus with ketoacidosis without coma: Principal | ICD-10-CM | POA: Diagnosis present

## 2018-09-11 DIAGNOSIS — E559 Vitamin D deficiency, unspecified: Secondary | ICD-10-CM | POA: Insufficient documentation

## 2018-09-11 DIAGNOSIS — Z885 Allergy status to narcotic agent status: Secondary | ICD-10-CM | POA: Insufficient documentation

## 2018-09-11 DIAGNOSIS — K589 Irritable bowel syndrome without diarrhea: Secondary | ICD-10-CM | POA: Diagnosis not present

## 2018-09-11 DIAGNOSIS — E0810 Diabetes mellitus due to underlying condition with ketoacidosis without coma: Secondary | ICD-10-CM

## 2018-09-11 DIAGNOSIS — K760 Fatty (change of) liver, not elsewhere classified: Secondary | ICD-10-CM | POA: Insufficient documentation

## 2018-09-11 DIAGNOSIS — E1143 Type 2 diabetes mellitus with diabetic autonomic (poly)neuropathy: Secondary | ICD-10-CM | POA: Diagnosis not present

## 2018-09-11 DIAGNOSIS — I130 Hypertensive heart and chronic kidney disease with heart failure and stage 1 through stage 4 chronic kidney disease, or unspecified chronic kidney disease: Secondary | ICD-10-CM | POA: Diagnosis not present

## 2018-09-11 DIAGNOSIS — E785 Hyperlipidemia, unspecified: Secondary | ICD-10-CM | POA: Diagnosis present

## 2018-09-11 DIAGNOSIS — E1165 Type 2 diabetes mellitus with hyperglycemia: Secondary | ICD-10-CM | POA: Diagnosis present

## 2018-09-11 DIAGNOSIS — Z8249 Family history of ischemic heart disease and other diseases of the circulatory system: Secondary | ICD-10-CM | POA: Diagnosis not present

## 2018-09-11 LAB — BETA-HYDROXYBUTYRIC ACID: Beta-Hydroxybutyric Acid: 3.14 mmol/L — ABNORMAL HIGH (ref 0.05–0.27)

## 2018-09-11 LAB — URINALYSIS, ROUTINE W REFLEX MICROSCOPIC
Bilirubin Urine: NEGATIVE
Glucose, UA: >=500 mg/dL — AB
Hgb urine dipstick: NEGATIVE
KETONES UR: 80 mg/dL — AB
Leukocytes, UA: NEGATIVE
Nitrite: NEGATIVE
PH: 6 (ref 5.0–8.0)
Protein, ur: 100 mg/dL — AB
SPECIFIC GRAVITY, URINE: 1.024 (ref 1.005–1.030)

## 2018-09-11 LAB — CBC WITH DIFFERENTIAL/PLATELET
Abs Immature Granulocytes: 0.2 10*3/uL — ABNORMAL HIGH (ref 0.0–0.1)
Basophils Absolute: 0.1 10*3/uL (ref 0.0–0.1)
Basophils Relative: 1 %
EOS ABS: 0 10*3/uL (ref 0.0–0.7)
EOS PCT: 0 %
HCT: 52.3 % — ABNORMAL HIGH (ref 36.0–46.0)
Hemoglobin: 17.3 g/dL — ABNORMAL HIGH (ref 12.0–15.0)
Immature Granulocytes: 1 %
LYMPHS ABS: 1.8 10*3/uL (ref 0.7–4.0)
LYMPHS PCT: 9 %
MCH: 28.5 pg (ref 26.0–34.0)
MCHC: 33.1 g/dL (ref 30.0–36.0)
MCV: 86.3 fL (ref 78.0–100.0)
MONO ABS: 0.7 10*3/uL (ref 0.1–1.0)
Monocytes Relative: 3 %
Neutro Abs: 17.2 10*3/uL — ABNORMAL HIGH (ref 1.7–7.7)
Neutrophils Relative %: 86 %
PLATELETS: 448 10*3/uL — AB (ref 150–400)
RBC: 6.06 MIL/uL — ABNORMAL HIGH (ref 3.87–5.11)
RDW: 13.6 % (ref 11.5–15.5)
WBC: 19.9 10*3/uL — ABNORMAL HIGH (ref 4.0–10.5)

## 2018-09-11 LAB — BASIC METABOLIC PANEL
ANION GAP: 9 (ref 5–15)
Anion gap: 21 — ABNORMAL HIGH (ref 5–15)
BUN: 11 mg/dL (ref 6–20)
BUN: 10 mg/dL (ref 6–20)
CHLORIDE: 103 mmol/L (ref 98–111)
CHLORIDE: 109 mmol/L (ref 98–111)
CO2: 16 mmol/L — AB (ref 22–32)
CO2: 22 mmol/L (ref 22–32)
CREATININE: 0.84 mg/dL (ref 0.44–1.00)
CREATININE: 0.65 mg/dL (ref 0.44–1.00)
Calcium: 9.7 mg/dL (ref 8.9–10.3)
Calcium: 9 mg/dL (ref 8.9–10.3)
GFR calc Af Amer: >60 mL/min (ref >60)
GFR calc non Af Amer: >60 mL/min (ref >60)
GFR calc non Af Amer: >60 mL/min (ref >60)
GLUCOSE: 386 mg/dL — AB (ref 70–99)
GLUCOSE: 279 mg/dL — AB (ref 70–99)
Potassium: 3 mmol/L — ABNORMAL LOW (ref 3.5–5.1)
Potassium: 3.7 mmol/L (ref 3.5–5.1)
Sodium: 140 mmol/L (ref 135–145)
Sodium: 140 mmol/L (ref 135–145)

## 2018-09-11 LAB — COMPREHENSIVE METABOLIC PANEL
ALBUMIN: 4.8 g/dL (ref 3.5–5.0)
ALT: 16 U/L (ref 0–44)
AST: 23 U/L (ref 15–41)
Alkaline Phosphatase: 120 U/L (ref 38–126)
Anion gap: 26 — ABNORMAL HIGH (ref 5–15)
BILIRUBIN TOTAL: 1.2 mg/dL (ref 0.3–1.2)
BUN: 14 mg/dL (ref 6–20)
CALCIUM: 9.9 mg/dL (ref 8.9–10.3)
CO2: 16 mmol/L — ABNORMAL LOW (ref 22–32)
CREATININE: 0.91 mg/dL (ref 0.44–1.00)
Chloride: 97 mmol/L — ABNORMAL LOW (ref 98–111)
GFR calc Af Amer: >60 mL/min (ref >60)
GLUCOSE: 600 mg/dL — AB (ref 70–99)
Potassium: 3.3 mmol/L — ABNORMAL LOW (ref 3.5–5.1)
SODIUM: 139 mmol/L (ref 135–145)
TOTAL PROTEIN: 8.3 g/dL — AB (ref 6.5–8.1)

## 2018-09-11 LAB — I-STAT VENOUS BLOOD GAS, ED
Acid-base deficit: 4 mmol/L — ABNORMAL HIGH (ref 0.0–2.0)
BICARBONATE: 18.5 mmol/L — AB (ref 20.0–28.0)
O2 SAT: 94 %
PCO2 VEN: 28.8 mmHg — AB (ref 44.0–60.0)
PO2 VEN: 68 mmHg — AB (ref 32.0–45.0)
TCO2: 19 mmol/L — AB (ref 22–32)
pH, Ven: 7.414 (ref 7.250–7.430)

## 2018-09-11 LAB — I-STAT CG4 LACTIC ACID, ED
LACTIC ACID, VENOUS: 5.16 mmol/L — AB (ref 0.5–1.9)
Lactic Acid, Venous: 3.64 mmol/L (ref 0.5–1.9)

## 2018-09-11 LAB — GLUCOSE, CAPILLARY
GLUCOSE-CAPILLARY: 246 mg/dL — AB (ref 70–99)
Glucose-Capillary: 341 mg/dL — ABNORMAL HIGH (ref 70–99)
Glucose-Capillary: 321 mg/dL — ABNORMAL HIGH (ref 70–99)
Glucose-Capillary: 242 mg/dL — ABNORMAL HIGH (ref 70–99)
Glucose-Capillary: 280 mg/dL — ABNORMAL HIGH (ref 70–99)
Glucose-Capillary: 217 mg/dL — ABNORMAL HIGH (ref 70–99)
Glucose-Capillary: 166 mg/dL — ABNORMAL HIGH (ref 70–99)

## 2018-09-11 LAB — MRSA PCR SCREENING: MRSA by PCR: NEGATIVE

## 2018-09-11 LAB — T4, FREE: FREE T4: 1.26 ng/dL (ref 0.82–1.77)

## 2018-09-11 LAB — CBG MONITORING, ED
GLUCOSE-CAPILLARY: 581 mg/dL — AB (ref 70–99)
GLUCOSE-CAPILLARY: 403 mg/dL — AB (ref 70–99)
Glucose-Capillary: 445 mg/dL — ABNORMAL HIGH (ref 70–99)

## 2018-09-11 LAB — LIPASE, BLOOD: Lipase: 26 U/L (ref 11–51)

## 2018-09-11 LAB — TSH: TSH: 1.374 u[IU]/mL (ref 0.350–4.500)

## 2018-09-11 LAB — LACTIC ACID, PLASMA
Lactic Acid, Venous: 3.2 mmol/L (ref 0.5–1.9)
Lactic Acid, Venous: 2.2 mmol/L (ref 0.5–1.9)

## 2018-09-11 MED ORDER — IOPAMIDOL (ISOVUE-370) INJECTION 76%
100.0000 mL | Freq: Once | INTRAVENOUS | Status: AC | PRN
  Administered 2018-09-11: 100 mL via INTRAVENOUS

## 2018-09-11 MED ORDER — ATORVASTATIN CALCIUM 40 MG PO TABS
80.0000 mg | ORAL_TABLET | Freq: Every day | ORAL | Status: DC
  Administered 2018-09-11: 80 mg via ORAL
  Filled 2018-09-11: qty 2

## 2018-09-11 MED ORDER — SERTRALINE HCL 50 MG PO TABS
50.0000 mg | ORAL_TABLET | Freq: Every day | ORAL | Status: DC
  Administered 2018-09-12: 50 mg via ORAL
  Filled 2018-09-11: qty 1

## 2018-09-11 MED ORDER — LACTATED RINGERS IV BOLUS
1000.0000 mL | Freq: Once | INTRAVENOUS | Status: AC
  Administered 2018-09-11: 1000 mL via INTRAVENOUS

## 2018-09-11 MED ORDER — INSULIN ASPART 100 UNIT/ML ~~LOC~~ SOLN: 0.0000 [IU] | SUBCUTANEOUS | Status: DC

## 2018-09-11 MED ORDER — SODIUM CHLORIDE 0.9 % IV SOLN: INTRAVENOUS | Status: DC

## 2018-09-11 MED ORDER — POTASSIUM CHLORIDE IN NACL 20-0.9 MEQ/L-% IV SOLN
Freq: Once | INTRAVENOUS | Status: DC
  Filled 2018-09-11: qty 1000

## 2018-09-11 MED ORDER — INSULIN REGULAR BOLUS VIA INFUSION
0.0000 [IU] | Freq: Three times a day (TID) | INTRAVENOUS | Status: DC | PRN
  Filled 2018-09-11: qty 10

## 2018-09-11 MED ORDER — SODIUM CHLORIDE 0.9 % IV SOLN
INTRAVENOUS | Status: AC
  Administered 2018-09-11: 15:00:00 via INTRAVENOUS

## 2018-09-11 MED ORDER — EZETIMIBE 10 MG PO TABS
10.0000 mg | ORAL_TABLET | Freq: Every day | ORAL | Status: DC
  Administered 2018-09-12: 10 mg via ORAL
  Filled 2018-09-11: qty 1

## 2018-09-11 MED ORDER — METOCLOPRAMIDE HCL 5 MG/ML IJ SOLN
5.0000 mg | Freq: Four times a day (QID) | INTRAMUSCULAR | Status: DC
  Administered 2018-09-11 – 2018-09-12 (×3): 5 mg via INTRAVENOUS
  Filled 2018-09-11 (×4): qty 2

## 2018-09-11 MED ORDER — LEVOTHYROXINE SODIUM 150 MCG PO TABS
150.0000 ug | ORAL_TABLET | Freq: Every day | ORAL | Status: DC
  Administered 2018-09-12: 150 ug via ORAL
  Filled 2018-09-11: qty 1
  Filled 2018-09-11: qty 2

## 2018-09-11 MED ORDER — ONDANSETRON HCL 4 MG/2ML IJ SOLN
4.0000 mg | Freq: Once | INTRAMUSCULAR | Status: AC
  Administered 2018-09-11: 4 mg via INTRAVENOUS
  Filled 2018-09-11: qty 2

## 2018-09-11 MED ORDER — POTASSIUM CHLORIDE 10 MEQ/100ML IV SOLN
10.0000 meq | INTRAVENOUS | Status: AC
  Administered 2018-09-11 – 2018-09-12 (×6): 10 meq via INTRAVENOUS
  Filled 2018-09-11 (×5): qty 100

## 2018-09-11 MED ORDER — SODIUM CHLORIDE 0.9 % IV SOLN
INTRAVENOUS | Status: DC | PRN
  Administered 2018-09-11: 3.9 [IU]/h via INTRAVENOUS
  Administered 2018-09-11: 6.9 [IU]/h via INTRAVENOUS
  Filled 2018-09-11: qty 1

## 2018-09-11 MED ORDER — METOPROLOL SUCCINATE ER 25 MG PO TB24
25.0000 mg | ORAL_TABLET | Freq: Every day | ORAL | Status: DC
  Administered 2018-09-12: 25 mg via ORAL
  Filled 2018-09-11: qty 1

## 2018-09-11 MED ORDER — POTASSIUM CHLORIDE 10 MEQ/100ML IV SOLN
10.0000 meq | INTRAVENOUS | Status: AC
  Administered 2018-09-11 (×4): 10 meq via INTRAVENOUS
  Filled 2018-09-11 (×4): qty 100

## 2018-09-11 MED ORDER — ASPIRIN 81 MG PO CHEW
81.0000 mg | CHEWABLE_TABLET | Freq: Every day | ORAL | Status: DC
  Administered 2018-09-12: 81 mg via ORAL
  Filled 2018-09-11: qty 1

## 2018-09-11 MED ORDER — ENOXAPARIN SODIUM 40 MG/0.4ML ~~LOC~~ SOLN
40.0000 mg | SUBCUTANEOUS | Status: DC
  Administered 2018-09-11: 40 mg via SUBCUTANEOUS
  Filled 2018-09-11: qty 0.4

## 2018-09-11 MED ORDER — DEXTROSE-NACL 5-0.45 % IV SOLN
INTRAVENOUS | Status: DC
  Administered 2018-09-11 – 2018-09-12 (×2): via INTRAVENOUS

## 2018-09-11 MED ORDER — INFLUENZA VAC SPLIT QUAD 0.5 ML IM SUSY
0.5000 mL | PREFILLED_SYRINGE | INTRAMUSCULAR | Status: AC | PRN
  Administered 2018-09-12: 0.5 mL via INTRAMUSCULAR
  Filled 2018-09-11: qty 0.5

## 2018-09-11 MED ORDER — SODIUM CHLORIDE 0.9 % IV SOLN
INTRAVENOUS | Status: DC
  Administered 2018-09-11: 6.9 [IU]/h via INTRAVENOUS
  Administered 2018-09-12: 5.9 [IU]/h via INTRAVENOUS
  Filled 2018-09-11 (×2): qty 1

## 2018-09-11 MED ORDER — PROMETHAZINE HCL 25 MG/ML IJ SOLN
12.5000 mg | Freq: Once | INTRAMUSCULAR | Status: AC
  Administered 2018-09-11: 12.5 mg via INTRAVENOUS
  Filled 2018-09-11: qty 1

## 2018-09-11 NOTE — Care Management Note (Addendum)
Case Management Note  Patient Details  Name: LICIA HARL MRN: [id number] Date of Birth: [birth year]  Subjective/Objective:      DKA. From home with husband/family. Multi admits.             MARYELLEN DOWDLE (Spouse)     [phone]      PCP: Daleen Squibb  Action/Plan: Transition to home when medically stable.Marland KitchenMarland KitchenNCM following for transion of care needs.  Expected Discharge Date:                  Expected Discharge Plan:  Home/Self Care  In-House Referral:     Discharge planning Services  CM Consult  Post Acute Care Choice:    Choice offered to:     DME Arranged:    DME Agency:     HH Arranged:   RN [place] Agency:    [facility] Status of Service:  In process, will continue to follow  If discussed at Long Length of Stay Meetings, dates discussed:    Additional Comments:  Sharin Mons, RN 09/11/2018, 3:55 PM

## 2018-09-11 NOTE — ED Triage Notes (Signed)
Pt here with c/o hyperglycemia , cbg 528 pt slo c/o n/v/d ,

## 2018-09-11 NOTE — ED Notes (Signed)
Attempted report 

## 2018-09-11 NOTE — Progress Notes (Signed)
CRITICAL VALUE ALERT  Critical Value:  Lactic Acid 3.2   Date & Time Notied:  09/11/18 6:01 PM  Provider Notified: Thomasenia Bottoms, MD.  Orders Received/Actions taken: Awaiting response/orders.

## 2018-09-11 NOTE — ED Provider Notes (Signed)
[facility] EMERGENCY DEPARTMENT Provider Note   CSN: [id number] Arrival date & time: 09/11/18  7793     History   Chief Complaint Chief Complaint  Patient presents with  . Hyperglycemia    HPI [name] [name] is a 57 y.o. female.  HPI   Patient is a 57yo female with PMHx HFpEF (EF 66% on nuclear stress in May 2019), DM, HTN, and hypothyroidism who presents with hyperglycemia in the 500s, lower abdominal pain, and N/V/D x last night.  Patient states lower abdominal pain is cramping and intermittent.  No hematemesis, melena, BRBPR, or hematuria.  Patient reports compliance with insulin this morning.  Unable to tolerate PO.  She denies fever, URI sxs, CP, SOB, and urinary complaints.  No sick contacts.  Multiple recent admissions for DKA.  Past Medical History:  Diagnosis Date  . Anxiety   . B12 deficiency   . Chest pain 04/05/2017  . CHF (congestive heart failure) ([place])   . DKA (diabetic ketoacidoses) ([place]) 05/25/2018  . GERD (gastroesophageal reflux disease)   . Hyperlipidemia   . Hypertension    [name]  . Hypothyroidism   . IBS (irritable bowel syndrome)   . Intermittent vertigo   . 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 Leg cramping    "@ night" (09/18/2013)  . Migraine    "once q couple months" (09/18/2013)  . Osteoporosis   . Peripheral neuropathy   . Type II diabetes mellitus ([place])   . Umbilical hernia    unrepaired (09/18/2013)    Patient Active Problem List   Diagnosis Date Noted  . Hyperglycemia 08/04/2018  . Tachycardia 08/04/2018  . MDD (major depressive disorder), single episode, mild ([place]) 07/01/2018  . ARF (acute renal failure) ([place]) 05/26/2018  . Colitis 03/07/2018  . Chronic chest pain   . Chronic systolic heart failure ([place]) 01/14/2018  . Noncompliance 01/14/2018  . Migraines 12/11/2017  . DKA (diabetic ketoacidoses) ([place]) 12/11/2017  . Chest discomfort 09/18/2017  . DKA, type 1 ([place]) 09/17/2017    . Phrygian cap 06/20/2017  . Lower abdominal pain   . Colitis presumed infectious   . Nonischemic cardiomyopathy ([place]) 03/22/2017  . Hypokalemia 02/15/2017  . NSTEMI (non-ST elevated myocardial infarction) ([place]) 02/11/2017  . H. pylori infection 02/09/2017  . Hypomagnesemia 02/09/2017  . Gastroparesis due to DM ([place]) 01/22/2017  . AKI (acute kidney injury) ([place]) 07/24/2016  . Diabetes ([place]) 07/17/2016  . Atrial fibrillation ([place]) 02/01/2015  . 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 Hepatic lesion 09/18/2013  . Vitamin D deficiency 05/20/2012  . Essential hypertension 02/01/2011  . Osteoporosis 02/01/2011  . B12 deficiency 04/04/2010  . Peripheral neuropathy ([place]) 05/10/2009  . GERD 04/08/2009  . Generalized anxiety disorder 06/22/2008  . Hypothyroidism 04/29/2008  . Hyperlipidemia 04/29/2008  . Irritable bowel syndrome 04/29/2008    Past Surgical History:  Procedure Laterality Date  . CESAREAN SECTION  1989  . LAPAROSCOPIC ASSISTED VAGINAL HYSTERECTOMY  09/23/2012   Procedure: LAPAROSCOPIC ASSISTED VAGINAL HYSTERECTOMY;  Surgeon: Linda Hedges, DO;  Location: [facility];  Service: Gynecology;  Laterality: N/A;  pull Dr Gregor Hams instrument  . LEFT HEART CATH AND CORONARY ANGIOGRAPHY N/A 02/11/2017   Procedure: Left Heart Cath and Coronary Angiography;  Surgeon: Lorretta Harp, MD;  Location: [facility];  Service: Cardiovascular;  Laterality: N/A;  . TUBAL LIGATION Bilateral 1992     OB History    Gravida  2  Para  2   Term      Preterm      AB      Living        SAB      TAB      Ectopic      Multiple      Live Births               Home Medications    Prior to Admission medications   Medication Sig Start Date End Date Taking? Authorizing Provider  aspirin 81 MG chewable tablet Chew 1 tablet (81 mg total) by mouth daily. 02/12/17   Jani Gravel, MD  atorvastatin (LIPITOR) 80 MG tablet Take 1 tablet (80 mg total) by mouth  daily at 6 PM. 03/13/18   Bedsole, Amy E, MD  Cholecalciferol (VITAMIN D3) 50000 units CAPS Take 1 capsule by mouth once a week. 07/22/18   Bedsole, Amy E, MD  ezetimibe (ZETIA) 10 MG tablet Take 1 tablet (10 mg total) by mouth daily. 09/11/16   Bedsole, Amy E, MD  furosemide (LASIX) 20 MG tablet Take 1 tablet (20 mg total) by mouth daily. Hold for the next 2 days, then resume on 08/08/2018 08/05/18 11/03/18  Elgergawy, Silver Huguenin, MD  insulin glargine (LANTUS) 100 UNIT/ML injection Inject 0.25 mLs (25 Units total) into the skin daily. 06/29/18   Hosie Poisson, MD  insulin lispro (HUMALOG KWIKPEN) 100 UNIT/ML KiwkPen Inject into skin 3 times a day at meal time according to the following sliding scale: CBG 70-120 - 0 units / CBG 121-150 - 1 unit / CBG 151-200 - 2 units / CBG 201-250 - 3 units / CBG 251-300 - 5 units / CBG 301-350 - 7 units / CBG 351-400+ - 9 units  YOU MUST FOLLOW A STRICT DIABETIC DIET 07/01/18   Bedsole, Amy E, MD  Insulin Pen Needle (PEN NEEDLES) 31G X 8 MM MISC Use to inject insulin 4 times a day.  Dx: E11.10 12/23/17   Jinny Sanders, MD  Lancets (ONETOUCH ULTRASOFT) lancets Use to check blood sugar daily as needed.  Dx: E11.10 12/23/17   Bedsole, Amy E, MD  levothyroxine (SYNTHROID, LEVOTHROID) 150 MCG tablet TAKE 1 TABLET BY MOUTH EVERY DAY 06/03/18   Bedsole, Amy E, MD  losartan (COZAAR) 25 MG tablet TAKE 1 TABLET BY MOUTH EVERY DAY 08/27/18   Barrett, Evelene Croon, PA-C  metoCLOPramide (REGLAN) 5 MG tablet TAKE 1 TABLET BY MOUTH 4 TIMES A DAY BEFORE MEALS AND AT BEDTIME 08/12/18   Bedsole, Amy E, MD  metoprolol succinate (TOPROL-XL) 25 MG 24 hr tablet Take 25 mg by mouth daily. 03/24/17   [provider]  nitroGLYCERIN (NITRODUR - DOSED IN MG/24 HR) 0.2 mg/hr patch APPLY 1 PATCH TO SKIN EVERY DAY AS NEEDED 08/27/18   Barrett, Evelene Croon, PA-C  ONETOUCH VERIO test strip USE TO CHECK BLOOD SUGAR DAILY AS NEEDED. DX: E11.10 06/25/18   [provider]  pantoprazole (PROTONIX) 40  MG tablet Take 1 tablet (40 mg total) by mouth daily at 12 noon. 01/28/18   Bedsole, Amy E, MD  RELPAX 40 MG tablet ONE TABLET BY MOUTH AT ONSET OF HEADACHE. MAY REPEAT IN 2 HOURS IF HEADACHE PERSISTS OR RECURS. Patient taking differently: Take 40 mg by mouth every 2 (two) hours as needed for migraine. One tablet by mouth at onset of headache. May repeat in 2 hours if headache persists or recurs. 10/17/16   Bedsole, Amy E, MD  sertraline (ZOLOFT) 50  MG tablet TAKE 1 TABLET BY MOUTH EVERY DAY 06/19/18   Jinny Sanders, MD    Family History Family History  Problem Relation Age of Onset  . Diabetes Other   . Heart disease Other   . Heart failure Mother 44       Her heart skipped a beat and stopped - per pt  . Diabetes Maternal Grandmother   . Alzheimer's disease Maternal Grandmother   . Coronary artery disease Maternal Grandmother   . Heart attack Maternal Grandfather   . Heart failure Brother     Social History Social History   Tobacco Use  . Smoking status: Former Smoker    Years: 0.20    Types: Cigarettes    Last attempt to quit: 12/10/1990    Years since quitting: 27.7  . Smokeless tobacco: Never Used  Substance Use Topics  . Alcohol use: No    Alcohol/week: 0.0 standard drinks  . Drug use: Yes    Frequency: 3.0 times per week    Types: Marijuana     Allergies   Codeine   Review of Systems Review of Systems  Constitutional: Negative for chills and fever.  HENT: Negative for rhinorrhea and sore throat.   Eyes: Negative for pain and visual disturbance.  Respiratory: Negative for cough and shortness of breath.   Cardiovascular: Negative for chest pain and palpitations.  Gastrointestinal: Positive for abdominal pain, diarrhea, nausea and vomiting. Negative for blood in stool.  Genitourinary: Negative for dysuria and hematuria.  Musculoskeletal: Negative for arthralgias and back pain.  Skin: Negative for color change and rash.  Neurological: Negative for seizures and  syncope.  All other systems reviewed and are negative.    Physical Exam Updated Vital Signs LMP 08/11/2012   Physical Exam  Constitutional: She is oriented to person, place, and time. She appears well-developed and well-nourished. No distress.  HENT:  Head: Normocephalic and atraumatic.  Dry mucous membranes.  Oropharynx clear.  Eyes: Pupils are equal, round, and reactive to light. Conjunctivae and EOM are normal.  Neck: Normal range of motion. Neck supple.  Cardiovascular: Normal rate, regular rhythm and intact distal pulses.  No murmur heard. Pulmonary/Chest: Effort normal and breath sounds normal. No respiratory distress. She has no wheezes. She has no rales.  Abdominal: Soft. She exhibits no distension. There is tenderness (lower). There is no guarding.  Musculoskeletal: Normal range of motion. She exhibits no edema.  Neurological: She is alert and oriented to person, place, and time.  Skin: Skin is warm and dry. Capillary refill takes less than 2 seconds. No rash noted.  Psychiatric: She has a normal mood and affect.  Nursing note and vitals reviewed.    ED Treatments / Results  Labs (all labs ordered are listed, but only abnormal results are displayed) Labs Reviewed - No data to display  EKG None  Radiology No results found.  Procedures Procedures (including critical care time)  Medications Ordered in ED Medications - No data to display   Initial Impression / Assessment and Plan / ED Course  I have reviewed the triage vital signs and the nursing notes.  Pertinent labs & imaging results that were available during my care of the patient were reviewed by me and considered in my medical decision making (see chart for details).     Patient is a 57yo female with PMHx HFpEF (EF 66% on nuclear stress in May 2019), DM, HTN, and hypothyroidism who presents with hyperglycemia in the 500s, lower abdominal pain, and  N/V/D x last night.   Multiple recent admissions for  DKA.  On arrival HDS.  Exam as above.  Initial istat labs significant for lactic acidosis of 5 and hyperglycemia 500.  Will pursue CT A/P with IV contrast given lower abdominal TTP.  Patient found to be in DKA as AG 26, glc 600, CO2 16 and beta hydroxybutyrate 3.14.  VBG compensated as pH 7.4, pCO2 28, and bicarb 19.  Corrected Na 147.  K 3.3 and Cl 97.  Will need K supplementation.  Kidney and liver function stable.  DKA likely caught early in course.   CT A/P without acute abnormality.  UA without infection.  Leukocytosis of 19.9 and Hgb 17.3 likely 2/2 hemoconcentration as no infectious source identified.  Elevated lactic 2/2 DKA not sepsis.  No Abx indicated.  IVF resuscitated and insulin gtt started. Zofran and phenergan given for nausea. Discussed case with hospitalist who will admit.  The plan for this patient was discussed with Dr. Tamera Punt who voiced agreement and who oversaw evaluation and treatment of this patient.  Final Clinical Impressions(s) / ED Diagnoses   Final diagnoses:  None    ED Discharge Orders    None       Fabian November, MD 09/12/18 1117    Malvin Johns, MD 09/12/18 1415

## 2018-09-11 NOTE — ED Notes (Signed)
Returned from ct scan 

## 2018-09-11 NOTE — ED Notes (Signed)
Patient transported to CT 

## 2018-09-11 NOTE — H&P (Addendum)
History and Physical    [name] [name] TKW:[id number] DOB: Oct 22, 1961 DOA: 09/11/2018  PCP: Jinny Sanders, MD Consultants:  None Patient coming from:  Home - lives with husband, son, brother; NOK: Husband, [phone]  Chief Complaint: DKA  HPI: [name] [name] is a 57 y.o. female with medical history significant of DM; stage 2 CKD; hypothyroidism; HTN; HLD; and CHF presenting with DKA.  She woke up this AM, felt queasy, drank a sip of fluid and it has been going on from there.  She continues to feel nauseated.  Many episodes of emesis.  She checked her glucose this AM and it was high; she checked it last night and it was 120.  She last took insulin this AM, and also took it last night.  She reports also taking insulin yesterday.  She missed a dose of her insulin last week, maybe misses once a week.  Prior DKA admissions in the last year in 10/18; 1/19 (plus an ER visit); 2/19; 3/19; 6/19; 7/19.  She was also admitted in 8/19 for hyperglycemia without DKA.  She reports that her last A1c was 14 and she has a consult appointment with endocrinology on 10/30.  ED Course: Multiple admissions for DKA due to noncompliance.  Glucose 600, +lactate, bicarb 16.  CT negative, no apparent infection, no antibiotics.  Zofran/Phenergan for N/V.  Review of Systems: As per HPI; otherwise review of systems reviewed and negative.   Ambulatory Status:  Ambulates without assistance  Past Medical History:  Diagnosis Date  . Anxiety   . B12 deficiency   . Chest pain 04/05/2017  . CHF (congestive heart failure) ([place])   . DKA (diabetic ketoacidoses) ([place]) 05/25/2018  . GERD (gastroesophageal reflux disease)   . Hyperlipidemia   . Hypertension    [name]  . Hypothyroidism   . IBS (irritable bowel syndrome)   . Intermittent vertigo   . 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 Leg cramping    "@ night" (09/18/2013)  . Migraine    "once q couple months" (09/18/2013)  . Osteoporosis    . Peripheral neuropathy   . Type II diabetes mellitus ([place])   . Umbilical hernia    unrepaired (09/18/2013)    Past Surgical History:  Procedure Laterality Date  . CESAREAN SECTION  1989  . LAPAROSCOPIC ASSISTED VAGINAL HYSTERECTOMY  09/23/2012   Procedure: LAPAROSCOPIC ASSISTED VAGINAL HYSTERECTOMY;  Surgeon: Linda Hedges, DO;  Location: [facility];  Service: Gynecology;  Laterality: N/A;  pull Dr Gregor Hams instrument  . LEFT HEART CATH AND CORONARY ANGIOGRAPHY N/A 02/11/2017   Procedure: Left Heart Cath and Coronary Angiography;  Surgeon: Lorretta Harp, MD;  Location: [facility];  Service: Cardiovascular;  Laterality: N/A;  . TUBAL LIGATION Bilateral 1992    Social History   Socioeconomic History  . Marital status: Married    Spouse name: Not on file  . Number of children: 2  . Years of education: Not on file  . Highest education level: Not on file  Occupational History  . Occupation: UNEMPLOYED    Fish farm manager: UNEMPLOYED    Comment: homemaker  Social Needs  . Financial resource strain: Not on file  . Food insecurity:    Worry: Not on file    Inability: Not on file  . Transportation needs:    Medical: Not on file    Non-medical: Not on file  Tobacco Use  . Smoking status: Former Smoker    Years: 0.20  Types: Cigarettes    Last attempt to quit: 12/10/1990    Years since quitting: 27.7  . Smokeless tobacco: Never Used  Substance and Sexual Activity  . Alcohol use: No    Alcohol/week: 0.0 standard drinks  . Drug use: Yes    Frequency: 3.0 times per week    Types: Marijuana    Comment: last use a couple of days ago  . Sexual activity: Yes  Lifestyle  . Physical activity:    Days per week: Not on file    Minutes per session: Not on file  . Stress: Not on file  Relationships  . Social connections:    Talks on phone: Not on file    Gets together: Not on file    Attends religious service: Not on file    Active member of club or organization: Not on file     Attends meetings of clubs or organizations: Not on file    Relationship status: Not on file  . Intimate partner violence:    Fear of current or ex partner: Not on file    Emotionally abused: Not on file    Physically abused: Not on file    Forced sexual activity: Not on file  Other Topics Concern  . Not on file  Social History Narrative   Regular exercise- no    Diet- lots of mountain dew, limits fast foods     Allergies  Allergen Reactions  . Codeine Hives, Anxiety and Other (See Comments)    Family History  Problem Relation Age of Onset  . Diabetes Other   . Heart disease Other   . Heart failure Mother 65       Her heart skipped a beat and stopped - per pt  . Diabetes Maternal Grandmother   . Alzheimer's disease Maternal Grandmother   . Coronary artery disease Maternal Grandmother   . Heart attack Maternal Grandfather   . Heart failure Brother     Prior to Admission medications   Medication Sig Start Date End Date Taking? Authorizing Provider  aspirin 81 MG chewable tablet Chew 1 tablet (81 mg total) by mouth daily. 02/12/17  Yes Jani Gravel, MD  atorvastatin (LIPITOR) 80 MG tablet Take 1 tablet (80 mg total) by mouth daily at 6 PM. 03/13/18  Yes Bedsole, Amy E, MD  ezetimibe (ZETIA) 10 MG tablet Take 1 tablet (10 mg total) by mouth daily. 09/11/16  Yes Bedsole, Amy E, MD  furosemide (LASIX) 20 MG tablet Take 1 tablet (20 mg total) by mouth daily. Hold for the next 2 days, then resume on 08/08/2018 08/05/18 11/03/18 Yes Elgergawy, Silver Huguenin, MD  insulin glargine (LANTUS) 100 UNIT/ML injection Inject 0.25 mLs (25 Units total) into the skin daily. 06/29/18  Yes Hosie Poisson, MD  insulin lispro (HUMALOG KWIKPEN) 100 UNIT/ML KiwkPen Inject into skin 3 times a day at meal time according to the following sliding scale: CBG 70-120 - 0 units / CBG 121-150 - 1 unit / CBG 151-200 - 2 units / CBG 201-250 - 3 units / CBG 251-300 - 5 units / CBG 301-350 - 7 units / CBG 351-400+ - 9  units  YOU MUST FOLLOW A STRICT DIABETIC DIET 07/01/18  Yes Bedsole, Amy E, MD  levothyroxine (SYNTHROID, LEVOTHROID) 150 MCG tablet TAKE 1 TABLET BY MOUTH EVERY DAY 06/03/18  Yes Bedsole, Amy E, MD  losartan (COZAAR) 25 MG tablet TAKE 1 TABLET BY MOUTH EVERY DAY 08/27/18  Yes Barrett, Evelene Croon, PA-C  metoCLOPramide (  REGLAN) 5 MG tablet TAKE 1 TABLET BY MOUTH 4 TIMES A DAY BEFORE MEALS AND AT BEDTIME Patient taking differently: Take 5 mg by mouth See admin instructions. TAKE 1 TABLET BY MOUTH 4 TIMES A DAY BEFORE MEALS AND AT BEDTIME 08/12/18  Yes Bedsole, Amy E, MD  metoprolol succinate (TOPROL-XL) 25 MG 24 hr tablet Take 25 mg by mouth daily. 03/24/17  Yes [provider]  nitroGLYCERIN (NITRODUR - DOSED IN MG/24 HR) 0.2 mg/hr patch APPLY 1 PATCH TO SKIN EVERY DAY AS NEEDED Patient taking differently: Place 0.2 mg onto the skin daily.  08/27/18  Yes Barrett, Evelene Croon, PA-C  pantoprazole (PROTONIX) 40 MG tablet Take 1 tablet (40 mg total) by mouth daily at 12 noon. 01/28/18  Yes Bedsole, Amy E, MD  RELPAX 40 MG tablet ONE TABLET BY MOUTH AT ONSET OF HEADACHE. MAY REPEAT IN 2 HOURS IF HEADACHE PERSISTS OR RECURS. Patient taking differently: Take 40 mg by mouth every 2 (two) hours as needed for migraine. One tablet by mouth at onset of headache. May repeat in 2 hours if headache persists or recurs. 10/17/16  Yes Bedsole, Amy E, MD  sertraline (ZOLOFT) 50 MG tablet TAKE 1 TABLET BY MOUTH EVERY DAY 06/19/18  Yes Bedsole, Amy E, MD  Cholecalciferol (VITAMIN D3) 50000 units CAPS Take 1 capsule by mouth once a week. Patient not taking: Reported on 09/11/2018 07/22/18   Jinny Sanders, MD  Insulin Pen Needle (PEN NEEDLES) 31G X 8 MM MISC Use to inject insulin 4 times a day.  Dx: E11.10 12/23/17   Jinny Sanders, MD  Lancets (ONETOUCH ULTRASOFT) lancets Use to check blood sugar daily as needed.  Dx: E11.10 12/23/17   Jinny Sanders, MD  ONETOUCH VERIO test strip USE TO CHECK BLOOD SUGAR DAILY AS NEEDED. DX:  E11.10 06/25/18   [provider]    Physical Exam: Vitals:   09/11/18 1030 09/11/18 1045 09/11/18 1115 09/11/18 1130  BP: (!) 151/97 130/81 136/66 (!) 152/98  Pulse: 96 100 97 92  Resp: 15 16 (!) 22 (!) 24  Temp:      TempSrc:      SpO2: 99% 93% 92% 91%     General:  Appears uncomfortable with nausea Eyes:  EOMI, normal lids, iris ENT:  grossly normal hearing, lips & tongue, dry mm Neck:  no LAD, masses or thyromegaly; no carotid bruits Cardiovascular:  RRR, no m/r/g. No LE edema.  Respiratory:   CTA bilaterally with no wheezes/rales/rhonchi.  Normal respiratory effort. Abdomen:  Soft, diffusely TTP, ND, NABS Back:   normal alignment, no CVAT Skin:  no rash or induration seen on limited exam Musculoskeletal:  grossly normal tone BUE/BLE, good ROM, no bony abnormality Lower extremity:  No LE edema.  Limited foot exam with no ulcerations.  2+ distal pulses. Psychiatric:  grossly normal mood and affect, speech fluent and appropriate, AOx3 Neurologic:  CN 2-12 grossly intact, moves all extremities in coordinated fashion, sensation intact    Radiological Exams on Admission: Dg Chest 2 View  Result Date: 09/11/2018 CLINICAL DATA:  Nausea, vomiting, cough, hyperglycemia. Known diabetes. Former smoker. EXAM: CHEST - 2 VIEW COMPARISON:  PA and lateral chest x-ray of May 26, 2018 FINDINGS: The lungs are adequately inflated and clear. The heart and pulmonary vascularity are normal. The mediastinum is normal in width. There is no pleural effusion. The bony thorax exhibits no acute abnormality. IMPRESSION: There is no active cardiopulmonary disease. Electronically Signed   By: Shanon Brow  [place] M.D.   On: 09/11/2018 11:57   Ct Abdomen Pelvis W Contrast  Result Date: 09/11/2018 CLINICAL DATA:  57 year old female with acute LOWER abdominal and pelvic pain for several months. Nausea and vomiting for 1 day. EXAM: CT ABDOMEN AND PELVIS WITH CONTRAST TECHNIQUE: Multidetector CT imaging of  the abdomen and pelvis was performed using the standard protocol following bolus administration of intravenous contrast. CONTRAST:  100 cc intravenous Isovue 370 COMPARISON:  03/06/2018 and prior CTs FINDINGS: Lower chest: No acute abnormality Hepatobiliary: Mild hepatic steatosis again noted. No focal hepatic lesions are present. No new gallbladder abnormalities are noted. Mild hyperdense prominence along the gallbladder tip is unchanged from 2009. No biliary dilatation. Pancreas: Unremarkable Spleen: Unremarkable Adrenals/Urinary Tract: The kidneys, adrenal glands and bladder are unremarkable. Stomach/Bowel: Stomach is within normal limits. Appendix appears normal. No evidence of bowel wall thickening, distention, or inflammatory changes. Scattered colonic diverticula noted without evidence of diverticulitis. Vascular/Lymphatic: No significant vascular findings are present. No enlarged abdominal or pelvic lymph nodes. Reproductive: Status post hysterectomy. No adnexal masses. Other: No ascites, pneumoperitoneum or focal collection. A small umbilical hernia containing fat is unchanged. Musculoskeletal: No acute or suspicious bony abnormality. IMPRESSION: 1. No evidence of acute abnormality 2. Hepatic steatosis 3. Small stable umbilical hernia containing fat. Electronically Signed   By: Margarette Canada M.D.   On: 09/11/2018 13:14    EKG: Independently reviewed.  NSR with rate 94; nonspecific ST changes with no evidence of acute ischemia; NSCSLT   Labs on Admission: I have personally reviewed the available labs and imaging studies at the time of the admission.  Pertinent labs:   VBG: 7.414/28.8/68 K+ 3.3 CO2 16 Glucose 600 Anion gap 26 Lactate 5.16, 3.64 WBC 19.9 Beta-hydroxybutyrate 3.14 UA: 80 ketones, 100 protein  Assessment/Plan Principal Problem:   DKA (diabetic ketoacidosis) ([name]) Active Problems:   Hyperlipidemia   Essential hypertension   Kidney disease, chronic, stage II (GFR 60-89  ml/min)   Chronic systolic heart failure ([name])   Noncompliance   DKA, presumed due to noncompliance -Patient with poor baseline control (A1c 14.5 in 8/19) -Presenting as usual with n/v and found to be in DKA -No indication of illness as source -Mild to moderate DKA on admission based on pH 7.414, HCO3 18.5, anion gap 26, patient alert -Will observe in SDU with DKA protocol -K+ low at time of presentation and so potassium supplementation added -IVF at 150 cc/hr, NS until glucose <250 and then decrease rate to 125 and change to D51/2NS -She has outpatient endocrinology f/u scheduled for the end of the month, but clearly needs a better outpatient plan (and apparently compliance) to prevent recurrent need for hospitalization -Elevated lactate is thought to be related to DKA and not sepsis; will trend  Chronic systolic CHF -EF 50-09% in 10/18 -For now, she needs fairly aggressive IVF due to DKA but will need to be followed  Stage 2 CKD -Appears to be stable -Interestingly, she must not have been excessively dehydrated and so caught the DKA early since her renal function is still at her usual baseline  HTN -Hold Cozaar in the setting of DKA -Continue Toprol XL  HLD -Continue Lipitor, Zetia  Hypothyroidism -Check TSH/free T4 - abnormal results in 1/19 with no f/u testing since in Epic -Continue Synthroid at current dose for now   DVT prophylaxis: Lovenox  Code Status:  Full - confirmed with patient Family Communication: None present  Disposition Plan:  Home once clinically improved Consults called: None  Admission  status: It is my clinical opinion that referral for OBSERVATION is reasonable and necessary in this patient based on the above information provided. The aforementioned taken together are felt to place the patient at high risk for further clinical deterioration. However it is anticipated that the patient may be medically stable for discharge from the hospital within 24 to 48  hours.    Karmen Bongo MD [facility]  If note is complete, please contact covering daytime or nighttime physician. [email or web address] Password [email or web address]  09/11/2018, 3:30 PM

## 2018-09-11 NOTE — Progress Notes (Addendum)
[name] [name] [id number] Admission Data: 09/11/2018 3:35 PM Attending Provider: Karmen Bongo, MD  TWK:[id number], Amy E, MD Consults/ Treatment Team:   [name] [name] is a 57 y.o. female patient admitted from ED awake, alert  & orientated  X 3,  Full Code, VSS - Blood pressure (!) 152/98, pulse 92, temperature 97.7 F (36.5 C), temperature source Oral, resp. rate (!) 24, last menstrual period 08/11/2012, SpO2 91 %., on RA, no c/o shortness of breath, no c/o chest pain, no distress noted. Patient placed on PC monitor #10 and pt is currently running:sinus tachycardia.   IV site WDL:  hand right, condition patent and no redness and wrist right, condition patent and no redness with a transparent dsg that's clean dry and intact.  Allergies:   Allergies  Allergen Reactions  . Codeine Hives, Anxiety and Other (See Comments)     Past Medical History:  Diagnosis Date  . Anxiety   . B12 deficiency   . Chest pain 04/05/2017  . CHF (congestive heart failure) ([place])   . DKA (diabetic ketoacidoses) ([place]) 05/25/2018  . GERD (gastroesophageal reflux disease)   . Hyperlipidemia   . Hypertension    [name] [name]  . Hypothyroidism   . IBS (irritable bowel syndrome)   . Intermittent vertigo   . 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 Leg cramping    "@ night" (09/18/2013)  . Migraine    "once q couple months" (09/18/2013)  . Osteoporosis   . Peripheral neuropathy   . Type II diabetes mellitus ([place])   . Umbilical hernia    unrepaired (09/18/2013)    Pt orientation to unit, room and routine. Information packet given to patient/family and safety video watched.  Admission INP armband ID verified with patient/family, and in place. SR up x 2, fall risk assessment complete with Patient and family verbalizing understanding of risks associated with falls. Pt verbalizes an understanding of how to use the call bell and to call for help before getting out of bed.  Skin, clean-dry- intact  without evidence of bruising, or skin tears.   No evidence of skin break down noted on exam.  Patient on insulin gtt at 6.9. Dual signed off with [name] Lank, RN. Will continue to check blood glucose Q1. IV potassium and NS bolus currently running.   [name] Comber, RN 09/11/2018 3:35 PM

## 2018-09-12 DIAGNOSIS — E1110 Type 2 diabetes mellitus with ketoacidosis without coma: Secondary | ICD-10-CM | POA: Diagnosis not present

## 2018-09-12 DIAGNOSIS — R112 Nausea with vomiting, unspecified: Secondary | ICD-10-CM | POA: Diagnosis not present

## 2018-09-12 DIAGNOSIS — E7849 Other hyperlipidemia: Secondary | ICD-10-CM

## 2018-09-12 DIAGNOSIS — I5022 Chronic systolic (congestive) heart failure: Secondary | ICD-10-CM | POA: Diagnosis not present

## 2018-09-12 DIAGNOSIS — I10 Essential (primary) hypertension: Secondary | ICD-10-CM | POA: Diagnosis not present

## 2018-09-12 LAB — GLUCOSE, CAPILLARY
GLUCOSE-CAPILLARY: 158 mg/dL — AB (ref 70–99)
GLUCOSE-CAPILLARY: 134 mg/dL — AB (ref 70–99)
Glucose-Capillary: 134 mg/dL — ABNORMAL HIGH (ref 70–99)
Glucose-Capillary: 137 mg/dL — ABNORMAL HIGH (ref 70–99)
Glucose-Capillary: 140 mg/dL — ABNORMAL HIGH (ref 70–99)
Glucose-Capillary: 144 mg/dL — ABNORMAL HIGH (ref 70–99)
Glucose-Capillary: 147 mg/dL — ABNORMAL HIGH (ref 70–99)
Glucose-Capillary: 152 mg/dL — ABNORMAL HIGH (ref 70–99)
Glucose-Capillary: 174 mg/dL — ABNORMAL HIGH (ref 70–99)
Glucose-Capillary: 175 mg/dL — ABNORMAL HIGH (ref 70–99)
Glucose-Capillary: 176 mg/dL — ABNORMAL HIGH (ref 70–99)
Glucose-Capillary: 247 mg/dL — ABNORMAL HIGH (ref 70–99)

## 2018-09-12 LAB — CBC
HEMATOCRIT: 43.8 % (ref 36.0–46.0)
HEMOGLOBIN: 14.6 g/dL (ref 12.0–15.0)
MCH: 28.8 pg (ref 26.0–34.0)
MCHC: 33.3 g/dL (ref 30.0–36.0)
MCV: 86.4 fL (ref 78.0–100.0)
Platelets: 303 10*3/uL (ref 150–400)
RBC: 5.07 MIL/uL (ref 3.87–5.11)
RDW: 14 % (ref 11.5–15.5)
WBC: 15.4 10*3/uL — ABNORMAL HIGH (ref 4.0–10.5)

## 2018-09-12 LAB — URINE CULTURE

## 2018-09-12 LAB — LACTIC ACID, PLASMA: Lactic Acid, Venous: 1.3 mmol/L (ref 0.5–1.9)

## 2018-09-12 LAB — BASIC METABOLIC PANEL
ANION GAP: 9 (ref 5–15)
Anion gap: 7 (ref 5–15)
BUN: 11 mg/dL (ref 6–20)
BUN: 7 mg/dL (ref 6–20)
CALCIUM: 9 mg/dL (ref 8.9–10.3)
CALCIUM: 8.4 mg/dL — AB (ref 8.9–10.3)
CO2: 21 mmol/L — AB (ref 22–32)
CO2: 20 mmol/L — AB (ref 22–32)
Chloride: 108 mmol/L (ref 98–111)
Chloride: 109 mmol/L (ref 98–111)
Creatinine, Ser: 0.57 mg/dL (ref 0.44–1.00)
Creatinine, Ser: 0.54 mg/dL (ref 0.44–1.00)
GFR calc Af Amer: >60 mL/min (ref >60)
GFR calc non Af Amer: >60 mL/min (ref >60)
GFR calc non Af Amer: >60 mL/min (ref >60)
GLUCOSE: 182 mg/dL — AB (ref 70–99)
Glucose, Bld: 177 mg/dL — ABNORMAL HIGH (ref 70–99)
Potassium: 3.9 mmol/L (ref 3.5–5.1)
Potassium: 3.6 mmol/L (ref 3.5–5.1)
SODIUM: 136 mmol/L (ref 135–145)
Sodium: 138 mmol/L (ref 135–145)

## 2018-09-12 MED ORDER — INSULIN GLARGINE 100 UNIT/ML ~~LOC~~ SOLN: 30.0000 [IU] | Freq: Every day | SUBCUTANEOUS | 11 refills | Status: DC

## 2018-09-12 MED ORDER — SODIUM CHLORIDE 0.9 % IV BOLUS
500.0000 mL | Freq: Once | INTRAVENOUS | Status: AC
  Administered 2018-09-12: 500 mL via INTRAVENOUS

## 2018-09-12 MED ORDER — POTASSIUM CHLORIDE 10 MEQ/100ML IV SOLN
10.0000 meq | Freq: Once | INTRAVENOUS | Status: DC
  Filled 2018-09-12: qty 100

## 2018-09-12 MED ORDER — INSULIN ASPART 100 UNIT/ML ~~LOC~~ SOLN
0.0000 [IU] | Freq: Three times a day (TID) | SUBCUTANEOUS | Status: DC
  Administered 2018-09-12: 5 [IU] via SUBCUTANEOUS

## 2018-09-12 MED ORDER — INSULIN GLARGINE 100 UNIT/ML ~~LOC~~ SOLN
30.0000 [IU] | Freq: Every day | SUBCUTANEOUS | Status: DC
  Administered 2018-09-12: 30 [IU] via SUBCUTANEOUS
  Filled 2018-09-12: qty 0.3

## 2018-09-12 NOTE — Progress Notes (Signed)
Patient discharged per orders. Patient's husband at bedside during discharge instructions/education. No prescriptions given to patient. Medications, follow up appointments, home care discussed. Patient declined wheelchair for d/c. Patient and husband ambulated to main entrance with RN.  Roselyn Reef Roseland Braun,RN

## 2018-09-12 NOTE — Progress Notes (Addendum)
CRITICAL VALUE ALERT  Critical Value:  Lactic 2.2   Date & Time Notied:  09/11/18   Provider Notified: Lamar Blinks  Orders Received/Actions taken: MD made aware of critical lab value.

## 2018-09-12 NOTE — Progress Notes (Signed)
   09/12/18 1300  Clinical Encounter Type  Visited With Other (Comment)  Visit Type Initial   Responded to consult. Nurse stated PT was discharged.

## 2018-09-12 NOTE — Discharge Summary (Signed)
PATIENT DETAILS Name: [name] [name] Age: 57 y.o. Sex: female Date of Birth: 1960/12/30 MRN: [id number]. Admitting Physician: Karmen Bongo, MD [id number], Mervyn Gay, MD  Admit Date: 09/11/2018 Discharge date: 09/12/2018  Recommendations for Outpatient Follow-up:  1. Follow up with PCP in 1-2 weeks 2. Please obtain BMP/CBC in one week 3. Please ensure follow-up with endocrinology 4. Optimize glycemic control 5. Blood cultures negative so far-follow until final  Admitted From:  Home  Disposition: [place]: Yes  Equipment/Devices: None  Discharge Condition: Stable  CODE STATUS: FULL CODE  Diet recommendation:  Heart Healthy / Carb Modified  Brief Summary: See H&P, Labs, Consult and Test reports for all details in brief, patient is a 57 year old female with poorly controlled diabetes, hypertension, dyslipidemia presenting with diabetic ketoacidosis.  Rapidly improved after being treated with IV fluids and IV insulin.  Transitioned to subcutaneous insulin today with stable CBGs.  See below for further details   Brief Hospital Course: DKA: Rapidly improved after IV fluids and IV insulin.  Transition back to Lantus and sliding scale-doing well-tolerating advancement in diet-sugars remain stable.  Stable for discharge.  Nausea/vomiting: Suspect related to DKA-CT of the abdomen negative for acute abnormalities.  Abdomen is soft and nontender.  This is resolved-she is tolerating diet.  Poorly controlled insulin-dependent type 2 diabetes: Last A1c was 14-she claims she misses a few doses of insulin a week (suspect probably noncompliant).  She was counseled extensively-she apparently has a appointment with endocrinology later this month.  She will be discharged on 40 units of Lantus-further optimization of her insulin regimen will be deferred to the outpatient setting.  She was asked to keep her upcoming appointment with endocrinology.  Leukocytosis: No evidence of  infection-UA/chest x-ray negative.  Suspect leukocytosis is reactive in the setting of DKA.  Monitor off antibiotics-leukocytosis is downtrending without the use of any antimicrobial therapy.  Recheck CBC at PCPs office in 1 week.  Blood cultures negative so far-please follow until final  Chronic systolic heart failure: Euvolemic, continue Lasix, Toprol and Cozaar.  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  Get Medicines reviewed and adjusted: Please take all your medications with you for your next visit with your Primary MD  Laboratory/radiological data: Please request your Primary MD to go over all hospital tests and procedure/radiological results at the follow up, please ask your Primary MD to get all Hospital records sent to his/her office.  In some cases, they will be blood work, cultures and biopsy results pending at the time of your discharge. Please request that your primary care M.D. follows up on these results.  Also Note the following: If you  experience worsening of your admission symptoms, develop shortness of breath, life threatening emergency, suicidal or homicidal thoughts you must seek medical  attention immediately by calling 911 or calling your MD immediately  if symptoms less severe.  You must read complete instructions/literature along with all the possible adverse reactions/side effects for all the Medicines you take and that have been prescribed to you. Take any new Medicines after you have completely understood and accpet all the possible adverse reactions/side effects.   Do not drive when taking Pain medications or sleeping medications (Benzodaizepines)  Do not take more than prescribed Pain, Sleep and Anxiety Medications. It is not advisable to combine anxiety,sleep and pain medications without talking with your primary care practitioner  Special Instructions: If you have smoked or chewed Tobacco  in the last 2 yrs please stop smoking, stop any regular Alcohol  and or any Recreational drug use.  Wear Seat belts while driving.  Please note: You were cared for by a hospitalist during your hospital stay. Once you are discharged, your primary care physician will handle any further medical issues. Please note that NO REFILLS for any discharge medications will be authorized once you are discharged, as it is imperative that you return to your primary care physician (or establish a relationship with a primary care physician if you do not have one) for your post hospital discharge needs so that they can reassess your need for medications and monitor your lab values.   Increase activity slowly   Complete by:  As directed      Allergies as of 09/12/2018      Reactions   Codeine Hives, Anxiety, Other (See Comments)      Medication List    TAKE these medications   aspirin 81 MG chewable tablet Chew 1 tablet (81 mg total) by mouth daily.   atorvastatin 80 MG tablet Commonly known as:  LIPITOR Take 1 tablet (80 mg total) by mouth daily at 6 PM.   ezetimibe 10 MG tablet Commonly known as:  ZETIA Take 1 tablet (10 mg total) by mouth daily.   furosemide 20 MG tablet Commonly  known as:  LASIX Take 1 tablet (20 mg total) by mouth daily. Hold for the next 2 days, then resume on 08/08/2018   insulin glargine 100 UNIT/ML injection Commonly known as:  LANTUS Inject 0.3 mLs (30 Units total) into the skin daily. What changed:  how much to take   insulin lispro 100 UNIT/ML KiwkPen Commonly known as:  HUMALOG Inject into skin 3 times a day at meal time according to the following sliding scale: CBG 70-120 - 0 units / CBG 121-150 - 1 unit / CBG 151-200 - 2 units / CBG 201-250 - 3 units / CBG 251-300 - 5 units / CBG 301-350 - 7 units / CBG 351-400+ - 9 units  YOU MUST FOLLOW A STRICT DIABETIC DIET   levothyroxine 150 MCG tablet Commonly known as:  SYNTHROID, LEVOTHROID TAKE 1 TABLET BY MOUTH EVERY DAY   losartan 25 MG tablet Commonly known as:  COZAAR TAKE 1 TABLET BY MOUTH EVERY DAY   metoCLOPramide 5 MG tablet Commonly known as:  REGLAN TAKE 1 TABLET BY MOUTH 4 TIMES A DAY BEFORE MEALS AND AT BEDTIME What changed:  See the new instructions.   metoprolol succinate 25 MG 24 hr tablet Commonly known as:  TOPROL-XL Take 25 mg by mouth daily.   nitroGLYCERIN 0.2 mg/hr patch Commonly known as:  NITRODUR - Dosed in mg/24 hr APPLY  1 PATCH TO SKIN EVERY DAY AS NEEDED What changed:  See the new instructions.   onetouch ultrasoft lancets Use to check blood sugar daily as needed.  Dx: E11.10   ONETOUCH VERIO test strip Generic drug:  glucose blood USE TO CHECK BLOOD SUGAR DAILY AS NEEDED. DX: E11.10   pantoprazole 40 MG tablet Commonly known as:  PROTONIX Take 1 tablet (40 mg total) by mouth daily at 12 noon.   Pen Needles 31G X 8 MM Misc Use to inject insulin 4 times a day.  Dx: E11.10   RELPAX 40 MG tablet Generic drug:  eletriptan ONE TABLET BY MOUTH AT ONSET OF HEADACHE. MAY REPEAT IN 2 HOURS IF HEADACHE PERSISTS OR RECURS. What changed:  See the new instructions.   sertraline 50 MG tablet Commonly known as:  ZOLOFT TAKE 1 TABLET BY MOUTH  EVERY DAY      Follow-up Information    Bedsole, Amy E, MD. Schedule an appointment as soon as possible for a visit in 1 week(s).   Specialty:  Family Medicine Contact information: [place] [address] [phone]        Minus Breeding, MD. Schedule an appointment as soon as possible for a visit in 1 month(s).   Specialty:  Cardiology Contact information: [address] [place] [address] [phone]          Allergies  Allergen Reactions  . Codeine Hives, Anxiety and Other (See Comments)    Consultations:   None   Other Procedures/Studies: Dg Chest 2 View  Result Date: 09/11/2018 CLINICAL DATA:  Nausea, vomiting, cough, hyperglycemia. Known diabetes. Former smoker. EXAM: CHEST - 2 VIEW COMPARISON:  PA and lateral chest x-ray of May 26, 2018 FINDINGS: The lungs are adequately inflated and clear. The heart and pulmonary vascularity are normal. The mediastinum is normal in width. There is no pleural effusion. The bony thorax exhibits no acute abnormality. IMPRESSION: There is no active cardiopulmonary disease. Electronically Signed   By: David  Martinique M.D.   On: 09/11/2018 11:57   Ct Abdomen Pelvis W Contrast  Result Date: 09/11/2018 CLINICAL DATA:  57 year old female with acute LOWER abdominal and pelvic pain for several months. Nausea and vomiting for 1 day. EXAM: CT ABDOMEN AND PELVIS WITH CONTRAST TECHNIQUE: Multidetector CT imaging of the abdomen and pelvis was performed using the standard protocol following bolus administration of intravenous contrast. CONTRAST:  100 cc intravenous Isovue 370 COMPARISON:  03/06/2018 and prior CTs FINDINGS: Lower chest: No acute abnormality Hepatobiliary: Mild hepatic steatosis again noted. No focal hepatic lesions are present. No new gallbladder abnormalities are noted. Mild hyperdense prominence along the gallbladder tip is unchanged from 2009. No biliary dilatation. Pancreas: Unremarkable  Spleen: Unremarkable Adrenals/Urinary Tract: The kidneys, adrenal glands and bladder are unremarkable. Stomach/Bowel: Stomach is within normal limits. Appendix appears normal. No evidence of bowel wall thickening, distention, or inflammatory changes. Scattered colonic diverticula noted without evidence of diverticulitis. Vascular/Lymphatic: No significant vascular findings are present. No enlarged abdominal or pelvic lymph nodes. Reproductive: Status post hysterectomy. No adnexal masses. Other: No ascites, pneumoperitoneum or focal collection. A small umbilical hernia containing fat is unchanged. Musculoskeletal: No acute or suspicious bony abnormality. IMPRESSION: 1. No evidence of acute abnormality 2. Hepatic steatosis 3. Small stable umbilical hernia containing fat. Electronically Signed   By: Margarette Canada M.D.   On: 09/11/2018 13:14      TODAY-DAY OF DISCHARGE:  Subjective:   Vance Arterberry today has no headache,no chest  abdominal pain,no new weakness tingling or numbness, feels much better wants to go home today.   Objective:   Blood pressure 99/68, pulse 92, temperature 98.5 F (36.9 C), temperature source Oral, resp. rate 17, height 4' 11.5" (1.511 m), weight 63.7 kg, last menstrual period 08/11/2012, SpO2 98 %.  Intake/Output Summary (Last 24 hours) at 09/12/2018 1020 Last data filed at 09/12/2018 0559 Gross per 24 hour  Intake 1485.26 ml  Output 1001 ml  Net 484.26 ml   Filed Weights   09/11/18 1831  Weight: 63.7 kg    Exam: Awake Alert, Oriented *3, No new F.N deficits, Normal affect [place].AT,PERRAL Supple Neck,No JVD, No cervical lymphadenopathy appriciated.  Symmetrical Chest wall movement, Good air movement bilaterally, CTAB RRR,No Gallops,Rubs or new Murmurs, No Parasternal Heave +ve B.Sounds, Abd Soft, Non tender, No organomegaly appriciated, No rebound -guarding or rigidity. No Cyanosis, Clubbing or edema, No new Rash or bruise   PERTINENT RADIOLOGIC STUDIES: Dg Chest 2  View  Result Date: 09/11/2018 CLINICAL DATA:  Nausea, vomiting, cough, hyperglycemia. Known diabetes. Former smoker. EXAM: CHEST - 2 VIEW COMPARISON:  PA and lateral chest x-ray of May 26, 2018 FINDINGS: The lungs are adequately inflated and clear. The heart and pulmonary vascularity are normal. The mediastinum is normal in width. There is no pleural effusion. The bony thorax exhibits no acute abnormality. IMPRESSION: There is no active cardiopulmonary disease. Electronically Signed   By: David  Martinique M.D.   On: 09/11/2018 11:57   Ct Abdomen Pelvis W Contrast  Result Date: 09/11/2018 CLINICAL DATA:  57 year old female with acute LOWER abdominal and pelvic pain for several months. Nausea and vomiting for 1 day. EXAM: CT ABDOMEN AND PELVIS WITH CONTRAST TECHNIQUE: Multidetector CT imaging of the abdomen and pelvis was performed using the standard protocol following bolus administration of intravenous contrast. CONTRAST:  100 cc intravenous Isovue 370 COMPARISON:  03/06/2018 and prior CTs FINDINGS: Lower chest: No acute abnormality Hepatobiliary: Mild hepatic steatosis again noted. No focal hepatic lesions are present. No new gallbladder abnormalities are noted. Mild hyperdense prominence along the gallbladder tip is unchanged from 2009. No biliary dilatation. Pancreas: Unremarkable Spleen: Unremarkable Adrenals/Urinary Tract: The kidneys, adrenal glands and bladder are unremarkable. Stomach/Bowel: Stomach is within normal limits. Appendix appears normal. No evidence of bowel wall thickening, distention, or inflammatory changes. Scattered colonic diverticula noted without evidence of diverticulitis. Vascular/Lymphatic: No significant vascular findings are present. No enlarged abdominal or pelvic lymph nodes. Reproductive: Status post hysterectomy. No adnexal masses. Other: No ascites, pneumoperitoneum or focal collection. A small umbilical hernia containing fat is unchanged. Musculoskeletal: No acute or  suspicious bony abnormality. IMPRESSION: 1. No evidence of acute abnormality 2. Hepatic steatosis 3. Small stable umbilical hernia containing fat. Electronically Signed   By: Margarette Canada M.D.   On: 09/11/2018 13:14     PERTINENT LAB RESULTS: CBC: Recent Labs    09/11/18 1016 09/12/18 0704  WBC 19.9* 15.4*  HGB 17.3* 14.6  HCT 52.3* 43.8  PLT 448* 303   CMET CMP     Component Value Date/Time   NA 136 09/12/2018 0642   K 3.6 09/12/2018 0642   CL 109 09/12/2018 0642   CO2 20 (L) 09/12/2018 0642   GLUCOSE 177 (H) 09/12/2018 0642   BUN 7 09/12/2018 0642   CREATININE 0.54 09/12/2018 0642   CREATININE 0.66 12/20/2017 1750   CALCIUM 8.4 (L) 09/12/2018 0642   PROT 8.3 (H) 09/11/2018 1016   ALBUMIN 4.8 09/11/2018 1016   AST 23 09/11/2018 1016  ALT 16 09/11/2018 1016   ALKPHOS 120 09/11/2018 1016   BILITOT 1.2 09/11/2018 1016   GFRNONAA >60 09/12/2018 0642   GFRAA >60 09/12/2018 0642    GFR Estimated Creatinine Clearance: 65.4 mL/min (by C-G formula based on SCr of 0.54 mg/dL). Recent Labs    09/11/18 1016  LIPASE 26   No results for input(s): CKTOTAL, CKMB, CKMBINDEX, TROPONINI in the last 72 hours. Invalid input(s): POCBNP No results for input(s): DDIMER in the last 72 hours. No results for input(s): HGBA1C in the last 72 hours. No results for input(s): CHOL, HDL, LDLCALC, TRIG, CHOLHDL, LDLDIRECT in the last 72 hours. Recent Labs    09/11/18 1538  TSH 1.374   No results for input(s): VITAMINB12, FOLATE, FERRITIN, TIBC, IRON, RETICCTPCT in the last 72 hours. Coags: No results for input(s): INR in the last 72 hours.  Invalid input(s): PT Microbiology: Recent Results (from the past 240 hour(s))  Blood culture (routine x 2)     Status: None (Preliminary result)   Collection Time: 09/11/18 10:47 AM  Result Value Ref Range Status   Specimen Description BLOOD LEFT ANTECUBITAL  Final   Special Requests   Final    BOTTLES DRAWN AEROBIC AND ANAEROBIC Blood Culture  results may not be optimal due to an excessive volume of blood received in culture bottles   Culture   Final    NO GROWTH < 24 HOURS Performed at [facility] [address], [place], [place] [address]    Report Status PENDING  Incomplete  Blood culture (routine x 2)     Status: None (Preliminary result)   Collection Time: 09/11/18 10:59 AM  Result Value Ref Range Status   Specimen Description BLOOD BLOOD LEFT HAND  Final   Special Requests   Final    BOTTLES DRAWN AEROBIC AND ANAEROBIC Blood Culture results may not be optimal due to an excessive volume of blood received in culture bottles   Culture   Final    NO GROWTH < 24 HOURS Performed at [facility], [place] [address] [place], [place] [address]    Report Status PENDING  Incomplete  MRSA PCR Screening     Status: None   Collection Time: 09/11/18  3:13 PM  Result Value Ref Range Status   MRSA by PCR NEGATIVE NEGATIVE Final    Comment:        The GeneXpert MRSA Assay (FDA approved for NASAL specimens only), is one component of a comprehensive MRSA colonization surveillance program. It is not intended to diagnose MRSA infection nor to guide or monitor treatment for MRSA infections. Performed at [facility], [place] [address], [place], [place] [address]     FURTHER DISCHARGE INSTRUCTIONS:  Get Medicines reviewed and adjusted: Please take all your medications with you for your next visit with your Primary MD  Laboratory/radiological data: Please request your Primary MD to go over all hospital tests and procedure/radiological results at the follow up, please ask your Primary MD to get all Hospital records sent to his/her office.  In some cases, they will be blood work, cultures and biopsy results pending at the time of your discharge. Please request that your primary care M.D. goes through all the records of your hospital data and follows up on these results.  Also Note the following: If you experience  worsening of your admission symptoms, develop shortness of breath, life threatening emergency, suicidal or homicidal thoughts you must seek medical attention immediately by calling  911 or calling your MD immediately  if symptoms less severe.  You must read complete instructions/literature along with all the possible adverse reactions/side effects for all the Medicines you take and that have been prescribed to you. Take any new Medicines after you have completely understood and accpet all the possible adverse reactions/side effects.   Do not drive when taking Pain medications or sleeping medications (Benzodaizepines)  Do not take more than prescribed Pain, Sleep and Anxiety Medications. It is not advisable to combine anxiety,sleep and pain medications without talking with your primary care practitioner  Special Instructions: If you have smoked or chewed Tobacco  in the last 2 yrs please stop smoking, stop any regular Alcohol  and or any Recreational drug use.  Wear Seat belts while driving.  Please note: You were cared for by a hospitalist during your hospital stay. Once you are discharged, your primary care physician will handle any further medical issues. Please note that NO REFILLS for any discharge medications will be authorized once you are discharged, as it is imperative that you return to your primary care physician (or establish a relationship with a primary care physician if you do not have one) for your post hospital discharge needs so that they can reassess your need for medications and monitor your lab values.  Total Time spent coordinating discharge including counseling, education and face to face time equals 35 minutes.  Signed: Shanker Ghimire 09/12/2018 10:20 AM

## 2018-09-15 ENCOUNTER — Telehealth: Payer: Self-pay | Admitting: *Deleted

## 2018-09-15 NOTE — Telephone Encounter (Signed)
Lm requesting return call to complete TCM and confirm hosp f/u appt  

## 2018-09-16 ENCOUNTER — Telehealth: Payer: Self-pay | Admitting: Family Medicine

## 2018-09-16 LAB — CULTURE, BLOOD (ROUTINE X 2)
Culture: NO GROWTH
Culture: NO GROWTH

## 2018-09-16 NOTE — Telephone Encounter (Signed)
Copied from [place] [id number]. Topic: Quick Communication - See Telephone Encounter >> Sep 16, 2018  3:58 PM Percell Belt A wrote: CRM for notification. See Telephone encounter for: 09/16/18. [phone] Need verbal for skilled nursing  1 week 3

## 2018-09-16 NOTE — Telephone Encounter (Signed)
Left message giving verbal orders for skilled nursing 1 x week for 3 weeks.

## 2018-09-16 NOTE — Telephone Encounter (Signed)
Lm requesting return call to complete TCM and confirm hosp f/u appt  

## 2018-09-20 ENCOUNTER — Other Ambulatory Visit: Payer: Self-pay | Admitting: Family Medicine

## 2018-09-22 ENCOUNTER — Emergency Department (HOSPITAL_COMMUNITY): Payer: 59

## 2018-09-22 ENCOUNTER — Encounter (HOSPITAL_COMMUNITY): Payer: Self-pay

## 2018-09-22 ENCOUNTER — Observation Stay (HOSPITAL_COMMUNITY)
Admission: EM | Admit: 2018-09-22 | Discharge: 2018-09-23 | Disposition: A | Discharge status: Home / Self Care | Payer: 59 | Attending: Internal Medicine | Admitting: Internal Medicine

## 2018-09-22 ENCOUNTER — Other Ambulatory Visit: Payer: Self-pay

## 2018-09-22 DIAGNOSIS — I4891 Unspecified atrial fibrillation: Secondary | ICD-10-CM | POA: Diagnosis not present

## 2018-09-22 DIAGNOSIS — I10 Essential (primary) hypertension: Secondary | ICD-10-CM | POA: Diagnosis not present

## 2018-09-22 DIAGNOSIS — Z79890 Hormone replacement therapy (postmenopausal): Secondary | ICD-10-CM | POA: Diagnosis not present

## 2018-09-22 DIAGNOSIS — K429 Umbilical hernia without obstruction or gangrene: Secondary | ICD-10-CM | POA: Insufficient documentation

## 2018-09-22 DIAGNOSIS — Z91199 Patient's noncompliance with other medical treatment and regimen due to unspecified reason: Secondary | ICD-10-CM

## 2018-09-22 DIAGNOSIS — Z885 Allergy status to narcotic agent status: Secondary | ICD-10-CM | POA: Insufficient documentation

## 2018-09-22 DIAGNOSIS — K589 Irritable bowel syndrome without diarrhea: Secondary | ICD-10-CM | POA: Insufficient documentation

## 2018-09-22 DIAGNOSIS — Z794 Long term (current) use of insulin: Secondary | ICD-10-CM | POA: Diagnosis not present

## 2018-09-22 DIAGNOSIS — E039 Hypothyroidism, unspecified: Secondary | ICD-10-CM | POA: Diagnosis not present

## 2018-09-22 DIAGNOSIS — E873 Alkalosis: Secondary | ICD-10-CM | POA: Insufficient documentation

## 2018-09-22 DIAGNOSIS — I5022 Chronic systolic (congestive) heart failure: Secondary | ICD-10-CM

## 2018-09-22 DIAGNOSIS — D72829 Elevated white blood cell count, unspecified: Secondary | ICD-10-CM | POA: Diagnosis not present

## 2018-09-22 DIAGNOSIS — F419 Anxiety disorder, unspecified: Secondary | ICD-10-CM | POA: Insufficient documentation

## 2018-09-22 DIAGNOSIS — E1122 Type 2 diabetes mellitus with diabetic chronic kidney disease: Secondary | ICD-10-CM | POA: Insufficient documentation

## 2018-09-22 DIAGNOSIS — E1142 Type 2 diabetes mellitus with diabetic polyneuropathy: Secondary | ICD-10-CM | POA: Diagnosis not present

## 2018-09-22 DIAGNOSIS — E119 Type 2 diabetes mellitus without complications: Secondary | ICD-10-CM

## 2018-09-22 DIAGNOSIS — Z87891 Personal history of nicotine dependence: Secondary | ICD-10-CM | POA: Diagnosis not present

## 2018-09-22 DIAGNOSIS — R0789 Other chest pain: Secondary | ICD-10-CM | POA: Insufficient documentation

## 2018-09-22 DIAGNOSIS — K219 Gastro-esophageal reflux disease without esophagitis: Secondary | ICD-10-CM | POA: Diagnosis not present

## 2018-09-22 DIAGNOSIS — E1143 Type 2 diabetes mellitus with diabetic autonomic (poly)neuropathy: Secondary | ICD-10-CM | POA: Diagnosis not present

## 2018-09-22 DIAGNOSIS — I5020 Unspecified systolic (congestive) heart failure: Secondary | ICD-10-CM

## 2018-09-22 DIAGNOSIS — E785 Hyperlipidemia, unspecified: Secondary | ICD-10-CM | POA: Insufficient documentation

## 2018-09-22 DIAGNOSIS — Z7982 Long term (current) use of aspirin: Secondary | ICD-10-CM | POA: Diagnosis not present

## 2018-09-22 DIAGNOSIS — F329 Major depressive disorder, single episode, unspecified: Secondary | ICD-10-CM | POA: Diagnosis not present

## 2018-09-22 DIAGNOSIS — Z9119 Patient's noncompliance with other medical treatment and regimen: Secondary | ICD-10-CM

## 2018-09-22 DIAGNOSIS — E1110 Type 2 diabetes mellitus with ketoacidosis without coma: Secondary | ICD-10-CM | POA: Diagnosis not present

## 2018-09-22 DIAGNOSIS — N182 Chronic kidney disease, stage 2 (mild): Secondary | ICD-10-CM | POA: Diagnosis present

## 2018-09-22 DIAGNOSIS — K3184 Gastroparesis: Secondary | ICD-10-CM | POA: Diagnosis not present

## 2018-09-22 DIAGNOSIS — R112 Nausea with vomiting, unspecified: Secondary | ICD-10-CM | POA: Diagnosis not present

## 2018-09-22 DIAGNOSIS — Z79899 Other long term (current) drug therapy: Secondary | ICD-10-CM | POA: Insufficient documentation

## 2018-09-22 DIAGNOSIS — E1149 Type 2 diabetes mellitus with other diabetic neurological complication: Secondary | ICD-10-CM | POA: Diagnosis present

## 2018-09-22 DIAGNOSIS — E1165 Type 2 diabetes mellitus with hyperglycemia: Secondary | ICD-10-CM

## 2018-09-22 DIAGNOSIS — I130 Hypertensive heart and chronic kidney disease with heart failure and stage 1 through stage 4 chronic kidney disease, or unspecified chronic kidney disease: Secondary | ICD-10-CM | POA: Insufficient documentation

## 2018-09-22 DIAGNOSIS — I428 Other cardiomyopathies: Secondary | ICD-10-CM | POA: Diagnosis not present

## 2018-09-22 LAB — GLUCOSE, CAPILLARY
GLUCOSE-CAPILLARY: 225 mg/dL — AB (ref 70–99)
GLUCOSE-CAPILLARY: 184 mg/dL — AB (ref 70–99)
Glucose-Capillary: 257 mg/dL — ABNORMAL HIGH (ref 70–99)
Glucose-Capillary: 224 mg/dL — ABNORMAL HIGH (ref 70–99)
Glucose-Capillary: 152 mg/dL — ABNORMAL HIGH (ref 70–99)
Glucose-Capillary: 119 mg/dL — ABNORMAL HIGH (ref 70–99)

## 2018-09-22 LAB — CBC
HEMATOCRIT: 49.9 % — AB (ref 36.0–46.0)
HEMATOCRIT: 40.2 % (ref 36.0–46.0)
HEMOGLOBIN: 16.5 g/dL — AB (ref 12.0–15.0)
Hemoglobin: 13.6 g/dL (ref 12.0–15.0)
MCH: 28.4 pg (ref 26.0–34.0)
MCH: 29.1 pg (ref 26.0–34.0)
MCHC: 33.1 g/dL (ref 30.0–36.0)
MCHC: 33.8 g/dL (ref 30.0–36.0)
MCV: 85.9 fL (ref 80.0–100.0)
MCV: 86.1 fL (ref 80.0–100.0)
NRBC: 0 % (ref 0.0–0.2)
NRBC: 0 % (ref 0.0–0.2)
Platelets: 470 10*3/uL — ABNORMAL HIGH (ref 150–400)
Platelets: 314 10*3/uL (ref 150–400)
RBC: 5.81 MIL/uL — ABNORMAL HIGH (ref 3.87–5.11)
RBC: 4.67 MIL/uL (ref 3.87–5.11)
RDW: 14.2 % (ref 11.5–15.5)
RDW: 14.1 % (ref 11.5–15.5)
WBC: 23.8 10*3/uL — ABNORMAL HIGH (ref 4.0–10.5)
WBC: 14.4 10*3/uL — ABNORMAL HIGH (ref 4.0–10.5)

## 2018-09-22 LAB — BASIC METABOLIC PANEL
ANION GAP: 9 (ref 5–15)
ANION GAP: 9 (ref 5–15)
Anion gap: 24 — ABNORMAL HIGH (ref 5–15)
BUN: 14 mg/dL (ref 6–20)
BUN: 12 mg/dL (ref 6–20)
BUN: 11 mg/dL (ref 6–20)
CHLORIDE: 94 mmol/L — AB (ref 98–111)
CO2: 17 mmol/L — AB (ref 22–32)
CO2: 21 mmol/L — AB (ref 22–32)
CO2: 22 mmol/L (ref 22–32)
Calcium: 9.8 mg/dL (ref 8.9–10.3)
Calcium: 8 mg/dL — ABNORMAL LOW (ref 8.9–10.3)
Calcium: 8.4 mg/dL — ABNORMAL LOW (ref 8.9–10.3)
Chloride: 107 mmol/L (ref 98–111)
Chloride: 108 mmol/L (ref 98–111)
Creatinine, Ser: 0.79 mg/dL (ref 0.44–1.00)
Creatinine, Ser: 0.48 mg/dL (ref 0.44–1.00)
Creatinine, Ser: 0.38 mg/dL — ABNORMAL LOW (ref 0.44–1.00)
GFR calc Af Amer: >60 mL/min (ref >60)
GFR calc Af Amer: >60 mL/min (ref >60)
GFR calc Af Amer: >60 mL/min (ref >60)
GFR calc non Af Amer: >60 mL/min (ref >60)
GFR calc non Af Amer: >60 mL/min (ref >60)
GFR calc non Af Amer: >60 mL/min (ref >60)
GLUCOSE: 287 mg/dL — AB (ref 70–99)
GLUCOSE: 171 mg/dL — AB (ref 70–99)
Glucose, Bld: 502 mg/dL (ref 70–99)
POTASSIUM: 3.6 mmol/L (ref 3.5–5.1)
POTASSIUM: 3.7 mmol/L (ref 3.5–5.1)
POTASSIUM: 3.1 mmol/L — AB (ref 3.5–5.1)
Sodium: 135 mmol/L (ref 135–145)
Sodium: 137 mmol/L (ref 135–145)
Sodium: 139 mmol/L (ref 135–145)

## 2018-09-22 LAB — I-STAT CHEM 8, ED
BUN: 17 mg/dL (ref 6–20)
CALCIUM ION: 1.06 mmol/L — AB (ref 1.15–1.40)
CREATININE: 0.4 mg/dL — AB (ref 0.44–1.00)
Chloride: 97 mmol/L — ABNORMAL LOW (ref 98–111)
GLUCOSE: 510 mg/dL — AB (ref 70–99)
HCT: 50 % — ABNORMAL HIGH (ref 36.0–46.0)
Hemoglobin: 17 g/dL — ABNORMAL HIGH (ref 12.0–15.0)
Potassium: 4.4 mmol/L (ref 3.5–5.1)
Sodium: 132 mmol/L — ABNORMAL LOW (ref 135–145)
TCO2: 22 mmol/L (ref 22–32)

## 2018-09-22 LAB — URINALYSIS, ROUTINE W REFLEX MICROSCOPIC
BILIRUBIN URINE: NEGATIVE
Glucose, UA: >=500 mg/dL — AB
Hgb urine dipstick: NEGATIVE
KETONES UR: 80 mg/dL — AB
LEUKOCYTES UA: NEGATIVE
NITRITE: NEGATIVE
Protein, ur: 100 mg/dL — AB
Specific Gravity, Urine: 1.027 (ref 1.005–1.030)
pH: 6 (ref 5.0–8.0)

## 2018-09-22 LAB — I-STAT BETA HCG BLOOD, ED (MC, WL, AP ONLY)

## 2018-09-22 LAB — BLOOD GAS, VENOUS
ACID-BASE DEFICIT: 4.4 mmol/L — AB (ref 0.0–2.0)
Bicarbonate: 18.5 mmol/L — ABNORMAL LOW (ref 20.0–28.0)
DRAWN BY: 257881
O2 SAT: 93.9 %
PATIENT TEMPERATURE: 98.6
pCO2, Ven: 30.3 mmHg — ABNORMAL LOW (ref 44.0–60.0)
pH, Ven: 7.403 (ref 7.250–7.430)
pO2, Ven: 75.1 mmHg — ABNORMAL HIGH (ref 32.0–45.0)

## 2018-09-22 LAB — HEPATIC FUNCTION PANEL
ALBUMIN: 3.4 g/dL — AB (ref 3.5–5.0)
ALT: 17 U/L (ref 0–44)
AST: 16 U/L (ref 15–41)
Alkaline Phosphatase: 70 U/L (ref 38–126)
BILIRUBIN DIRECT: 0.1 mg/dL (ref 0.0–0.2)
Indirect Bilirubin: 0.4 mg/dL (ref 0.3–0.9)
TOTAL PROTEIN: 6.3 g/dL — AB (ref 6.5–8.1)
Total Bilirubin: 0.5 mg/dL (ref 0.3–1.2)

## 2018-09-22 LAB — I-STAT CG4 LACTIC ACID, ED: LACTIC ACID, VENOUS: 4.5 mmol/L — AB (ref 0.5–1.9)

## 2018-09-22 LAB — CBG MONITORING, ED
GLUCOSE-CAPILLARY: 421 mg/dL — AB (ref 70–99)
Glucose-Capillary: 417 mg/dL — ABNORMAL HIGH (ref 70–99)
Glucose-Capillary: 282 mg/dL — ABNORMAL HIGH (ref 70–99)

## 2018-09-22 LAB — LACTIC ACID, PLASMA
Lactic Acid, Venous: 1.4 mmol/L (ref 0.5–1.9)
Lactic Acid, Venous: 1.3 mmol/L (ref 0.5–1.9)

## 2018-09-22 LAB — LIPASE, BLOOD: Lipase: 40 U/L (ref 11–51)

## 2018-09-22 LAB — I-STAT TROPONIN, ED: Troponin i, poc: 0 ng/mL (ref 0.00–0.08)

## 2018-09-22 MED ORDER — METOPROLOL SUCCINATE ER 25 MG PO TB24
25.0000 mg | ORAL_TABLET | Freq: Every day | ORAL | Status: DC
  Administered 2018-09-22 – 2018-09-23 (×2): 25 mg via ORAL
  Filled 2018-09-22 (×2): qty 1

## 2018-09-22 MED ORDER — SODIUM CHLORIDE 0.9 % IV SOLN
INTRAVENOUS | Status: DC
  Administered 2018-09-22 (×2): via INTRAVENOUS

## 2018-09-22 MED ORDER — INSULIN REGULAR(HUMAN) IN NACL 100-0.9 UT/100ML-% IV SOLN
INTRAVENOUS | Status: DC
  Administered 2018-09-22: 100 [IU] via INTRAVENOUS
  Filled 2018-09-22: qty 100

## 2018-09-22 MED ORDER — ATORVASTATIN CALCIUM 40 MG PO TABS
80.0000 mg | ORAL_TABLET | Freq: Every day | ORAL | Status: DC
  Administered 2018-09-22: 80 mg via ORAL
  Filled 2018-09-22 (×2): qty 2

## 2018-09-22 MED ORDER — PANTOPRAZOLE SODIUM 40 MG PO TBEC: 40.0000 mg | DELAYED_RELEASE_TABLET | Freq: Every day | ORAL | Status: DC

## 2018-09-22 MED ORDER — SODIUM CHLORIDE 0.9 % IV BOLUS
1000.0000 mL | Freq: Once | INTRAVENOUS | Status: AC
  Administered 2018-09-22: 1000 mL via INTRAVENOUS

## 2018-09-22 MED ORDER — ENOXAPARIN SODIUM 40 MG/0.4ML ~~LOC~~ SOLN
40.0000 mg | SUBCUTANEOUS | Status: DC
  Administered 2018-09-22: 40 mg via SUBCUTANEOUS
  Filled 2018-09-22: qty 0.4

## 2018-09-22 MED ORDER — INSULIN REGULAR(HUMAN) IN NACL 100-0.9 UT/100ML-% IV SOLN
INTRAVENOUS | Status: DC
  Administered 2018-09-22: 100 [IU] via INTRAVENOUS

## 2018-09-22 MED ORDER — FENTANYL CITRATE (PF) 100 MCG/2ML IJ SOLN
50.0000 ug | Freq: Once | INTRAMUSCULAR | Status: AC
  Administered 2018-09-22: 50 ug via INTRAVENOUS
  Filled 2018-09-22: qty 2

## 2018-09-22 MED ORDER — SODIUM CHLORIDE 0.9 % IV SOLN: INTRAVENOUS | Status: DC

## 2018-09-22 MED ORDER — DEXTROSE-NACL 5-0.45 % IV SOLN: INTRAVENOUS | Status: DC

## 2018-09-22 MED ORDER — NITROGLYCERIN 0.2 MG/HR TD PT24
0.2000 mg | MEDICATED_PATCH | Freq: Every day | TRANSDERMAL | Status: DC
  Administered 2018-09-23: 0.2 mg via TRANSDERMAL
  Filled 2018-09-22: qty 1

## 2018-09-22 MED ORDER — INSULIN GLARGINE 100 UNIT/ML ~~LOC~~ SOLN
20.0000 [IU] | Freq: Every day | SUBCUTANEOUS | Status: DC
  Administered 2018-09-22 – 2018-09-23 (×2): 20 [IU] via SUBCUTANEOUS
  Filled 2018-09-22 (×2): qty 0.2

## 2018-09-22 MED ORDER — ASPIRIN 81 MG PO CHEW
81.0000 mg | CHEWABLE_TABLET | Freq: Every day | ORAL | Status: DC
  Administered 2018-09-22 – 2018-09-23 (×2): 81 mg via ORAL
  Filled 2018-09-22 (×3): qty 1

## 2018-09-22 MED ORDER — SERTRALINE HCL 50 MG PO TABS
50.0000 mg | ORAL_TABLET | Freq: Every day | ORAL | Status: DC
  Administered 2018-09-22 – 2018-09-23 (×2): 50 mg via ORAL
  Filled 2018-09-22 (×3): qty 1

## 2018-09-22 MED ORDER — POTASSIUM CHLORIDE CRYS ER 20 MEQ PO TBCR
40.0000 meq | EXTENDED_RELEASE_TABLET | Freq: Once | ORAL | Status: AC
  Administered 2018-09-22: 40 meq via ORAL
  Filled 2018-09-22: qty 2

## 2018-09-22 MED ORDER — DEXTROSE-NACL 5-0.45 % IV SOLN
INTRAVENOUS | Status: DC
  Administered 2018-09-22: 18:00:00 via INTRAVENOUS

## 2018-09-22 MED ORDER — POTASSIUM CHLORIDE 10 MEQ/100ML IV SOLN
10.0000 meq | INTRAVENOUS | Status: AC
  Administered 2018-09-22: 10 meq via INTRAVENOUS
  Filled 2018-09-22: qty 100

## 2018-09-22 MED ORDER — LEVOTHYROXINE SODIUM 75 MCG PO TABS
150.0000 ug | ORAL_TABLET | Freq: Every day | ORAL | Status: DC
  Administered 2018-09-23: 150 ug via ORAL
  Filled 2018-09-22 (×2): qty 2

## 2018-09-22 MED ORDER — PROMETHAZINE HCL 25 MG/ML IJ SOLN
25.0000 mg | Freq: Once | INTRAMUSCULAR | Status: AC
  Administered 2018-09-22: 25 mg via INTRAVENOUS
  Filled 2018-09-22: qty 1

## 2018-09-22 MED ORDER — EZETIMIBE 10 MG PO TABS
10.0000 mg | ORAL_TABLET | Freq: Every day | ORAL | Status: DC
  Administered 2018-09-22 – 2018-09-23 (×2): 10 mg via ORAL
  Filled 2018-09-22 (×3): qty 1

## 2018-09-22 NOTE — ED Provider Notes (Signed)
[facility] DEPT Provider Note   CSN: [id number] Arrival date & time: 09/22/18  1147     History   Chief Complaint Chief Complaint  Patient presents with  . Hyperglycemia  . Emesis    HPI [name] [name] is a 57 y.o. female with a history of poorly controlled insulin-dependent type 2 diabetes mellitus with multiple hospitalizations for DKA this year who presents the emergency department with chief complaint of "I think I am in DKA again."  The patient states that she ate dinner at her church with her husband last night.  She took her normal doses of insulin and around 2 AM woke up with pain in her abdomen and chest and began vomiting and having severe nausea.  She states that she was unable to stop.  She currently continues to have chest pain.  She states that she has chest pain all the time.  She denies shortness of breath, chest pressure, pain that radiates into her jaw or arm, paresthesia or diaphoresis.  She denies urinary symptoms except for frequency which she attributes to her high blood sugars.  She denies focal abdominal tenderness.  The patient was admitted for DKA on the third of this month and discharged on the fourth.  At that time she was noted to have nausea and vomiting with a normal abdomen CT, a leukocytosis of unknown etiology affected to be reactive in the setting of DKA.  HPI  Past Medical History:  Diagnosis Date  . Anxiety   . B12 deficiency   . Chest pain 04/05/2017  . CHF (congestive heart failure) ([place])   . DKA (diabetic ketoacidoses) ([place]) 05/25/2018  . GERD (gastroesophageal reflux disease)   . Hyperlipidemia   . Hypertension    [name]  . Hypothyroidism   . IBS (irritable bowel syndrome)   . Intermittent vertigo   . 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 Leg cramping    "@ night" (09/18/2013)  . Migraine    "once q couple months" (09/18/2013)  . Osteoporosis   . Peripheral neuropathy   .  Type II diabetes mellitus ([place])   . Umbilical hernia    unrepaired (09/18/2013)    Patient Active Problem List   Diagnosis Date Noted  . DKA (diabetic ketoacidosis) ([place]) 09/11/2018  . Hyperglycemia 08/04/2018  . Tachycardia 08/04/2018  . MDD (major depressive disorder), single episode, mild ([place]) 07/01/2018  . ARF (acute renal failure) ([place]) 05/26/2018  . Colitis 03/07/2018  . Chronic chest pain   . Chronic systolic heart failure ([place]) 01/14/2018  . Noncompliance 01/14/2018  . Migraines 12/11/2017  . DKA (diabetic ketoacidoses) ([place]) 12/11/2017  . Chest discomfort 09/18/2017  . DKA, type 1 ([place]) 09/17/2017  . Phrygian cap 06/20/2017  . Lower abdominal pain   . Colitis presumed infectious   . Nonischemic cardiomyopathy ([place]) 03/22/2017  . Hypokalemia 02/15/2017  . NSTEMI (non-ST elevated myocardial infarction) ([place]) 02/11/2017  . H. pylori infection 02/09/2017  . Hypomagnesemia 02/09/2017  . Gastroparesis due to DM ([place]) 01/22/2017  . AKI (acute kidney injury) ([place]) 07/24/2016  . Diabetes ([place]) 07/17/2016  . Atrial fibrillation ([place]) 02/01/2015  . 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 Hepatic lesion 09/18/2013  . Vitamin D deficiency 05/20/2012  . Essential hypertension 02/01/2011  . Osteoporosis 02/01/2011  . B12 deficiency 04/04/2010  . Peripheral neuropathy ([place]) 05/10/2009  . GERD 04/08/2009  . Generalized anxiety disorder 06/22/2008  . Hypothyroidism 04/29/2008  .  Hyperlipidemia 04/29/2008  . Irritable bowel syndrome 04/29/2008    Past Surgical History:  Procedure Laterality Date  . CESAREAN SECTION  1989  . LAPAROSCOPIC ASSISTED VAGINAL HYSTERECTOMY  09/23/2012   Procedure: LAPAROSCOPIC ASSISTED VAGINAL HYSTERECTOMY;  Surgeon: Linda Hedges, DO;  Location: [facility];  Service: Gynecology;  Laterality: N/A;  pull Dr Gregor Hams instrument  . LEFT HEART CATH AND CORONARY ANGIOGRAPHY N/A 02/11/2017   Procedure: Left Heart Cath and Coronary  Angiography;  Surgeon: Lorretta Harp, MD;  Location: [facility];  Service: Cardiovascular;  Laterality: N/A;  . TUBAL LIGATION Bilateral 1992     OB History    Gravida  2   Para  2   Term      Preterm      AB      Living        SAB      TAB      Ectopic      Multiple      Live Births               Home Medications    Prior to Admission medications   Medication Sig Start Date End Date Taking? Authorizing Provider  aspirin 81 MG chewable tablet Chew 1 tablet (81 mg total) by mouth daily. 02/12/17  Yes Jani Gravel, MD  atorvastatin (LIPITOR) 80 MG tablet Take 1 tablet (80 mg total) by mouth daily at 6 PM. 03/13/18  Yes Bedsole, Amy E, MD  ezetimibe (ZETIA) 10 MG tablet Take 1 tablet (10 mg total) by mouth daily. 09/11/16  Yes Bedsole, Amy E, MD  furosemide (LASIX) 20 MG tablet Take 1 tablet (20 mg total) by mouth daily. Hold for the next 2 days, then resume on 08/08/2018 08/05/18 11/03/18 Yes Elgergawy, Silver Huguenin, MD  insulin glargine (LANTUS) 100 UNIT/ML injection Inject 0.3 mLs (30 Units total) into the skin daily. 09/12/18  Yes Ghimire, Henreitta Leber, MD  insulin lispro (HUMALOG KWIKPEN) 100 UNIT/ML KiwkPen Inject into skin 3 times a day at meal time according to the following sliding scale: CBG 70-120 - 0 units / CBG 121-150 - 1 unit / CBG 151-200 - 2 units / CBG 201-250 - 3 units / CBG 251-300 - 5 units / CBG 301-350 - 7 units / CBG 351-400+ - 9 units  YOU MUST FOLLOW A STRICT DIABETIC DIET 07/01/18  Yes Bedsole, Amy E, MD  levothyroxine (SYNTHROID, LEVOTHROID) 150 MCG tablet TAKE 1 TABLET BY MOUTH EVERY DAY 06/03/18  Yes Bedsole, Amy E, MD  losartan (COZAAR) 25 MG tablet TAKE 1 TABLET BY MOUTH EVERY DAY 08/27/18  Yes Barrett, Evelene Croon, PA-C  metoCLOPramide (REGLAN) 5 MG tablet TAKE 1 TABLET BY MOUTH 4 TIMES A DAY BEFORE MEALS AND AT BEDTIME Patient taking differently: Take 5 mg by mouth See admin instructions. TAKE 1 TABLET BY MOUTH 4 TIMES A DAY BEFORE MEALS AND  AT BEDTIME 08/12/18  Yes Bedsole, Amy E, MD  metoprolol succinate (TOPROL-XL) 25 MG 24 hr tablet Take 25 mg by mouth daily. 03/24/17  Yes [provider]  nitroGLYCERIN (NITRODUR - DOSED IN MG/24 HR) 0.2 mg/hr patch APPLY 1 PATCH TO SKIN EVERY DAY AS NEEDED Patient taking differently: Place 0.2 mg onto the skin daily.  08/27/18  Yes Barrett, Evelene Croon, PA-C  pantoprazole (PROTONIX) 40 MG tablet Take 1 tablet (40 mg total) by mouth daily at 12 noon. 01/28/18  Yes Bedsole, Amy E, MD  sertraline (ZOLOFT) 50 MG tablet TAKE 1 TABLET BY  MOUTH EVERY DAY 06/19/18  Yes Bedsole, Amy E, MD  Insulin Pen Needle (PEN NEEDLES) 31G X 8 MM MISC Use to inject insulin 4 times a day.  Dx: E11.10 12/23/17   Jinny Sanders, MD  Lancets (ONETOUCH ULTRASOFT) lancets Use to check blood sugar daily as needed.  Dx: E11.10 12/23/17   Jinny Sanders, MD  ONETOUCH VERIO test strip USE TO CHECK BLOOD SUGAR DAILY AS NEEDED. DX: E11.10 09/21/18   Bedsole, Amy E, MD  RELPAX 40 MG tablet ONE TABLET BY MOUTH AT ONSET OF HEADACHE. MAY REPEAT IN 2 HOURS IF HEADACHE PERSISTS OR RECURS. Patient not taking: One tablet by mouth at onset of headache. May repeat in 2 hours if headache persists or recurs. 10/17/16   Jinny Sanders, MD    Family History Family History  Problem Relation Age of Onset  . Diabetes Other   . Heart disease Other   . Heart failure Mother 4       Her heart skipped a beat and stopped - per pt  . Diabetes Maternal Grandmother   . Alzheimer's disease Maternal Grandmother   . Coronary artery disease Maternal Grandmother   . Heart attack Maternal Grandfather   . Heart failure Brother     Social History Social History   Tobacco Use  . Smoking status: Former Smoker    Years: 0.20    Types: Cigarettes    Last attempt to quit: 12/10/1990    Years since quitting: 27.8  . Smokeless tobacco: Never Used  Substance Use Topics  . Alcohol use: No    Alcohol/week: 0.0 standard drinks  . Drug use: Yes    Frequency:  3.0 times per week    Types: Marijuana    Comment: last use a couple of days ago     Allergies   Codeine   Review of Systems Review of Systems Ten systems reviewed and are negative for acute change, except as noted in the HPI.    Physical Exam Updated Vital Signs BP (!) 158/97 (BP Location: Left Arm)   Pulse (!) 102   Temp (!) 97.3 F (36.3 C) (Oral)   Resp 18   Ht 4\' 11"  (1.499 m)   Wt 63.5 kg   LMP 08/11/2012   SpO2 98%   BMI 28.28 kg/m   Physical Exam  Constitutional: She is oriented to person, place, and time. She appears well-developed. She appears ill. No distress.  Patient appears ill, actively vomiting during my evaluation Peers older than stated age  HENT:  Head: Normocephalic and atraumatic.  Dry oral mucosa  Eyes: Pupils are equal, round, and reactive to light. Conjunctivae and EOM are normal. No scleral icterus.  Neck: Normal range of motion. Neck supple.  Cardiovascular: Normal rate, regular rhythm and normal heart sounds. Exam reveals no gallop and no friction rub.  No murmur heard. Pulmonary/Chest: Effort normal and breath sounds normal. No respiratory distress.  Abdominal: Soft. Bowel sounds are normal. She exhibits no distension and no mass. There is tenderness. There is no guarding.  Neurological: She is alert and oriented to person, place, and time.  Skin: Skin is warm and dry. She is not diaphoretic.  Psychiatric: Her behavior is normal.  Nursing note and vitals reviewed.    ED Treatments / Results  Labs (all labs ordered are listed, but only abnormal results are displayed) Labs Reviewed  BASIC METABOLIC PANEL - Abnormal; Notable for the following components:      Result Value  Chloride 94 (*)    CO2 17 (*)    Glucose, Bld 502 (*)    Anion gap 24 (*)    All other components within normal limits  CBC - Abnormal; Notable for the following components:   WBC 23.8 (*)    RBC 5.81 (*)    Hemoglobin 16.5 (*)    HCT 49.9 (*)    Platelets  470 (*)    All other components within normal limits  CBG MONITORING, ED - Abnormal; Notable for the following components:   Glucose-Capillary 421 (*)    All other components within normal limits  URINALYSIS, ROUTINE W REFLEX MICROSCOPIC  BLOOD GAS, VENOUS  I-STAT BETA HCG BLOOD, ED ([facility], [facility], [facility] ONLY)  I-STAT CHEM 8, ED  I-STAT TROPONIN, ED  I-STAT CG4 LACTIC ACID, ED  CBG MONITORING, ED    EKG None  Radiology No results found.  Procedures .Critical Care Performed by: Margarita Mail, PA-C Authorized by: Margarita Mail, PA-C   Critical care provider statement:    Critical care time (minutes):  45   Critical care was necessary to treat or prevent imminent or life-threatening deterioration of the following conditions:  Metabolic crisis   Critical care was time spent personally by me on the following activities:  Discussions with consultants, evaluation of patient's response to treatment, examination of patient, ordering and performing treatments and interventions, ordering and review of laboratory studies, ordering and review of radiographic studies, pulse oximetry, re-evaluation of patient's condition, obtaining history from patient or surrogate and review of old charts   (including critical care time)  Medications Ordered in ED Medications  insulin regular, human (MYXREDLIN) 100 units/ 100 mL infusion (has no administration in time range)  sodium chloride 0.9 % bolus 1,000 mL (has no administration in time range)    And  sodium chloride 0.9 % bolus 1,000 mL (has no administration in time range)    And  0.9 %  sodium chloride infusion (has no administration in time range)  dextrose 5 %-0.45 % sodium chloride infusion (has no administration in time range)  potassium chloride 10 mEq in 100 mL IVPB (has no administration in time range)  promethazine (PHENERGAN) injection 25 mg (has no administration in time range)  fentaNYL (SUBLIMAZE) injection 50 mcg (has no administration in  time range)     Initial Impression / Assessment and Plan / ED Course  I have reviewed the triage vital signs and the nursing notes.  Pertinent labs & imaging results that were available during my care of the patient were reviewed by me and considered in my medical decision making (see chart for details).  Clinical Course as of Sep 22 1438  Mon Sep 22, 2018  1414 Patient presenting similarly to previous with elevated WBC count, tachycardia, vomiting, dehydration and lactic acidosis.I suspect that this is all related to rective state and significant dehydration due to her DKA. I therefor have not ordered sepsis protocol after shared decision with Dr. Lita Mains.    [AH]  1438 Ketones, ur(!): 69 [AH]    Clinical Course User Index [AH] Margarita Mail, PA-C    57 year old female with recurrent bouts of DKA who presents today with intractable vomiting.  Blood sugar is 510 with ketones in her urine.  An anion gap of 24.  Although the patient's lactic acid is elevated with elevated white blood cell count I doubt that this is due to sepsis.  This is very similar to her previous admissions for DKA.  Patient receiving  IV fluids.  She has a compensated metabolic acidosis.  She is improved with treatment here in the emergency department.  Final Clinical Impressions(s) / ED Diagnoses   Final diagnoses:  Diabetic ketoacidosis without coma associated with type 2 diabetes mellitus [facility])    ED Discharge Orders    None       Margarita Mail, PA-C 09/22/18 2237    Margette Fast, MD 09/23/18 [phone]

## 2018-09-22 NOTE — ED Notes (Signed)
Pt self inducing vomiting. RN going into to start line and pt noted to be sticking fingers down throat. Pts instructed not to self induce vomiting.

## 2018-09-22 NOTE — ED Notes (Signed)
Bed: WA02 Expected date:  Expected time:  Means of arrival:  Comments: Triage 3

## 2018-09-22 NOTE — ED Triage Notes (Addendum)
Pt arrives via [facility] from home. Pt reports hyperglycemia. Pt also reports N/V for 10hrs. Pt seen in hospital on 10/3 for the same, pts diabetes medication adjusted at that time. Pt reports adjustment is not working for her. Pts CBG with EMS:493mg /dL

## 2018-09-22 NOTE — ED Notes (Signed)
ED TO INPATIENT HANDOFF REPORT  Name/Age/Gender [name] [name] 57 y.o. female  Code Status Code Status History    Date Active Date Inactive Code Status Order ID Comments User Context   09/11/2018 1452 09/12/2018 1621 Full Code [id number]  Karmen Bongo, MD ED   08/04/2018 1038 08/05/2018 2012 Full Code [id number]  [name], NP ED   06/27/2018 1633 06/29/2018 1757 Full Code [id number]  Caren Griffins, MD ED   05/26/2018 0242 05/28/2018 2108 Full Code [id number]  [name], MD ED   03/06/2018 2107 03/08/2018 1752 Full Code [id number]  [name], MD ED   02/02/2018 1129 02/04/2018 1854 Full Code [id number]  [name], MD ED   12/11/2017 1232 12/14/2017 1658 Full Code [id number]  [name], PA-C ED   09/19/2017 0746 09/23/2017 1924 Full Code [id number]  [name], PA-C ED   09/17/2017 2203 09/18/2017 1940 Full Code [id number]  [name], MD ED   06/22/2017 1709 06/24/2017 1930 Full Code [id number]  [name], DO Inpatient   06/03/2017 1626 06/05/2017 1955 Full Code [id number]  [name], PA-C ED   04/05/2017 1143 04/08/2017 2117 Full Code [id number]  [name], NP ED   03/22/2017 2120 03/23/2017 1831 Full Code [id number]  [name], MD Inpatient   02/09/2017 0958 02/11/2017 2302 Full Code [id number]  [name], NP ED   07/24/2016 1633 07/26/2016 2113 Full Code [id number]  [name], MD ED   07/24/2016 1633 07/24/2016 1633 Full Code [id number]  [name], MD ED   10/28/2013 2338 10/29/2013 1749 Full Code [id number]  [name], MD ED   09/18/2013 1649 09/20/2013 1637 Full Code [id number]  [name], MD Inpatient   09/23/2012 1135 09/25/2012 1753 Full Code [id number]  [name], RN Inpatient      Home/SNF/Other Home  Chief Complaint emesis   Level of Care/Admitting Diagnosis ED Disposition    ED Disposition Condition [facility] Area: [place] [[id number]]  Level  of Care: Stepdown [14]  Admit to SDU based on following criteria: Severe physiological/psychological symptoms:  Any diagnosis requiring assessment & intervention at least every 4 hours on an ongoing basis to obtain desired patient outcomes including stability and rehabilitation  Diagnosis: DKA (diabetic ketoacidoses) [facility]) [[id number]]  Admitting Physician: Elodia Florence [phone]  Attending Physician: Cephus Slater, A CALDWELL [phone]  Estimated length of stay: past midnight tomorrow  Certification:: I certify this patient will need inpatient services for at least 2 midnights  PT Class (Do Not Modify): Inpatient [101]  PT Acc Code (Do Not Modify): Private [1]       Medical History Past Medical History:  Diagnosis Date  . Anxiety   . B12 deficiency   . Chest pain 04/05/2017  . CHF (congestive heart failure) ([place])   . DKA (diabetic ketoacidoses) ([place]) 05/25/2018  . GERD (gastroesophageal reflux disease)   . Hyperlipidemia   . Hypertension    [name]  . Hypothyroidism   . IBS (irritable bowel syndrome)   . Intermittent vertigo   . 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 Leg cramping    "@ night" (09/18/2013)  . Migraine    "once q couple months" (09/18/2013)  . Osteoporosis   . Peripheral neuropathy   . Type II diabetes mellitus ([place])   . Umbilical hernia    unrepaired (09/18/2013)    Allergies Allergies  Allergen Reactions  . Codeine Hives, Anxiety and Other (See Comments)    IV Location/Drains/Wounds Patient Lines/Drains/Airways Status   Active Line/Drains/Airways    Name:   Placement date:   Placement time:   Site:   Days:   Peripheral IV 09/22/18 Right Forearm   09/22/18    1210    Forearm   less than 1   Peripheral IV 09/22/18 Left Antecubital   09/22/18    1419    Antecubital   less than 1   Wound / Incision (Open or Dehisced) 12/11/17 Non-pressure wound Rectum non-blanching redness   12/11/17    1817    Rectum   285           Labs/Imaging Results for orders placed or performed during the hospital encounter of 09/22/18 (from the past 48 hour(s))  CBG monitoring, ED     Status: Abnormal   Collection Time: 09/22/18 12:01 PM  Result Value Ref Range   Glucose-Capillary 421 (H) 70 - 99 mg/dL  Basic metabolic panel     Status: Abnormal   Collection Time: 09/22/18 12:13 PM  Result Value Ref Range   Sodium 135 135 - 145 mmol/L    Comment: REPEATED TO VERIFY   Potassium 3.6 3.5 - 5.1 mmol/L   Chloride 94 (L) 98 - 111 mmol/L    Comment: REPEATED TO VERIFY   CO2 17 (L) 22 - 32 mmol/L    Comment: REPEATED TO VERIFY   Glucose, Bld 502 (HH) 70 - 99 mg/dL    Comment: REPEATED TO VERIFY CRITICAL RESULT CALLED TO, READ BACK BY AND VERIFIED WITH: Hulan Saas RN AT 1312 09/22/18 MULLINS,T    BUN 14 6 - 20 mg/dL   Creatinine, Ser 0.79 0.44 - 1.00 mg/dL   Calcium 9.8 8.9 - 10.3 mg/dL   GFR calc non Af Amer >60 >60 mL/min   GFR calc Af Amer >60 >60 mL/min    Comment: (NOTE) The eGFR has been calculated using the CKD EPI equation. This calculation has not been validated in all clinical situations. eGFR's persistently <60 mL/min signify possible Chronic Kidney Disease.    Anion gap 24 (H) 5 - 15    Comment: REPEATED TO VERIFY Performed at [facility], [place] [address], [place], [place] [address]   CBC     Status: Abnormal   Collection Time: 09/22/18 12:13 PM  Result Value Ref Range   WBC 23.8 (H) 4.0 - 10.5 K/uL   RBC 5.81 (H) 3.87 - 5.11 MIL/uL   Hemoglobin 16.5 (H) 12.0 - 15.0 g/dL   HCT 49.9 (H) 36.0 - 46.0 %   MCV 85.9 80.0 - 100.0 fL   MCH 28.4 26.0 - 34.0 pg   MCHC 33.1 30.0 - 36.0 g/dL   RDW 14.2 11.5 - 15.5 %   Platelets 470 (H) 150 - 400 K/uL   nRBC 0.0 0.0 - 0.2 %    Comment: Performed at [facility], [place] [address] [place],  [address]  I-Stat beta hCG blood, ED     Status: None   Collection Time: 09/22/18 12:19 PM  Result Value Ref Range   I-stat  hCG, quantitative <5.0 <5 mIU/mL   Comment 3            Comment:   GEST. AGE      CONC.  (mIU/mL)   <=1 WEEK        5 - 50     2 WEEKS  50 - 500     3 WEEKS       100 - 10,000     4 WEEKS     1,000 - 30,000        FEMALE AND NON-PREGNANT FEMALE:     LESS THAN 5 mIU/mL   Urinalysis, Routine w reflex microscopic     Status: Abnormal   Collection Time: 09/22/18  1:58 PM  Result Value Ref Range   Color, Urine STRAW (A) YELLOW   APPearance CLEAR CLEAR   Specific Gravity, Urine 1.027 1.005 - 1.030   pH 6.0 5.0 - 8.0   Glucose, UA >=500 (A) NEGATIVE mg/dL   Hgb urine dipstick NEGATIVE NEGATIVE   Bilirubin Urine NEGATIVE NEGATIVE   Ketones, ur 80 (A) NEGATIVE mg/dL   Protein, ur 100 (A) NEGATIVE mg/dL   Nitrite NEGATIVE NEGATIVE   Leukocytes, UA NEGATIVE NEGATIVE   RBC / HPF 0-5 0 - 5 RBC/hpf   WBC, UA 0-5 0 - 5 WBC/hpf   Bacteria, UA RARE (A) NONE SEEN   Squamous Epithelial / LPF 0-5 0 - 5    Comment: Performed at [facility], [place] [address], [place], [place] [address]  I-stat troponin, ED     Status: None   Collection Time: 09/22/18  2:04 PM  Result Value Ref Range   Troponin i, poc 0.00 0.00 - 0.08 ng/mL   Comment 3            Comment: Due to the release kinetics of cTnI, a negative result within the first hours of the onset of symptoms does not rule out myocardial infarction with certainty. If myocardial infarction is still suspected, repeat the test at appropriate intervals.   I-Stat Chem 8, ED     Status: Abnormal   Collection Time: 09/22/18  2:06 PM  Result Value Ref Range   Sodium 132 (L) 135 - 145 mmol/L   Potassium 4.4 3.5 - 5.1 mmol/L   Chloride 97 (L) 98 - 111 mmol/L   BUN 17 6 - 20 mg/dL   Creatinine, Ser 0.40 (L) 0.44 - 1.00 mg/dL   Glucose, Bld 510 (HH) 70 - 99 mg/dL   Calcium, Ion 1.06 (L) 1.15 - 1.40 mmol/L   TCO2 22 22 - 32 mmol/L   Hemoglobin 17.0 (H) 12.0 - 15.0 g/dL   HCT 50.0 (H) 36.0 - 46.0 %   Comment NOTIFIED PHYSICIAN    I-Stat CG4 Lactic Acid, ED     Status: Abnormal   Collection Time: 09/22/18  2:07 PM  Result Value Ref Range   Lactic Acid, Venous 4.50 (HH) 0.5 - 1.9 mmol/L   Comment NOTIFIED PHYSICIAN   Blood gas, venous     Status: Abnormal   Collection Time: 09/22/18  2:26 PM  Result Value Ref Range   pH, Ven 7.403 7.250 - 7.430   pCO2, Ven 30.3 (L) 44.0 - 60.0 mmHg   pO2, Ven 75.1 (H) 32.0 - 45.0 mmHg   Bicarbonate 18.5 (L) 20.0 - 28.0 mmol/L   Acid-base deficit 4.4 (H) 0.0 - 2.0 mmol/L   O2 Saturation 93.9 %   Patient temperature 98.6    Collection site DRAWN BY RN    Drawn by [phone]    Sample type VENOUS     Comment: Performed at [facility], [place] [address], [place], [place] [address]  CBG monitoring, ED     Status: Abnormal   Collection Time: 09/22/18  2:42 PM  Result Value Ref Range  Glucose-Capillary 417 (H) 70 - 99 mg/dL  CBG monitoring, ED     Status: Abnormal   Collection Time: 09/22/18  3:59 PM  Result Value Ref Range   Glucose-Capillary 282 (H) 70 - 99 mg/dL   Dg Chest Portable 1 View  Result Date: 09/22/2018 CLINICAL DATA:  Initial evaluation for DKA. EXAM: PORTABLE CHEST 1 VIEW COMPARISON:  Prior radiograph from 09/11/2018. FINDINGS: The cardiac and mediastinal silhouettes are stable in size and contour, and remain within normal limits. The lungs are normally inflated. No airspace consolidation, pleural effusion, or pulmonary edema is identified. There is no pneumothorax. No acute osseous abnormality identified. IMPRESSION: No active cardiopulmonary disease. Electronically Signed   By: Jeannine Boga M.D.   On: 09/22/2018 14:12    Pending Labs [employer] (From admission, onward)    Start     Ordered   Signed and Held  CBC  (enoxaparin (LOVENOX)    CrCl >/= 30 ml/min)  Once,   R    Comments:  Baseline for enoxaparin therapy IF NOT ALREADY DRAWN.  Notify MD if PLT < 100 K.    Signed and Held   Signed and Held  Creatinine, serum  (enoxaparin  (LOVENOX)    CrCl >/= 30 ml/min)  Once,   R    Comments:  Baseline for enoxaparin therapy IF NOT ALREADY DRAWN.    Signed and Held   Signed and Held  Creatinine, serum  (enoxaparin (LOVENOX)    CrCl >/= 30 ml/min)  Weekly,   R    Comments:  while on enoxaparin therapy    Signed and Held   Signed and Held  Comprehensive metabolic panel  Tomorrow morning,   R     Signed and Held   Signed and Held  CBC  Tomorrow morning,   R     Signed and Held   Signed and Held  Basic metabolic panel  STAT Now then every 4 hours ,   STAT     Signed and Held          Vitals/Pain Today's Vitals   09/22/18 1430 09/22/18 1445 09/22/18 1530 09/22/18 1600  BP: 96/67 (!) 101/59 (!) 98/54 (!) 101/59  Pulse: 94 92 99 98  Resp: '11 16 18 ' (!) 21  Temp:      TempSrc:      SpO2: 97% 95% 96% 95%  Weight:      Height:      PainSc:        Isolation Precautions No active isolations  Medications Medications  insulin regular, human (MYXREDLIN) 100 units/ 100 mL infusion (2.2 Units/hr Intravenous Rate/Dose Change 09/22/18 1601)  sodium chloride 0.9 % bolus 1,000 mL (0 mLs Intravenous Stopped 09/22/18 1526)    And  sodium chloride 0.9 % bolus 1,000 mL (0 mLs Intravenous Stopped 09/22/18 1531)    And  0.9 %  sodium chloride infusion ( Intravenous New Bag/Given 09/22/18 1531)  dextrose 5 %-0.45 % sodium chloride infusion (has no administration in time range)  potassium chloride 10 mEq in 100 mL IVPB (0 mEq Intravenous Stopped 09/22/18 1525)  promethazine (PHENERGAN) injection 25 mg (25 mg Intravenous Given 09/22/18 1407)  fentaNYL (SUBLIMAZE) injection 50 mcg (50 mcg Intravenous Given 09/22/18 1408)    Mobility walks with person assist

## 2018-09-22 NOTE — ED Notes (Signed)
Date and time results received: 09/22/18 1313  Test: Glucose  Critical Value: 502  Name of Provider Notified: Margarita Mail, PA  Orders Received? Or Actions Taken?: Will continue to monitor and wait for new orders.

## 2018-09-22 NOTE — H&P (Signed)
History and Physical    [name] [name] VOH:[id number] DOB: [birth year] DOA: 09/22/2018  PCP: Jinny Sanders, MD  Patient coming from: home  I have personally briefly reviewed patient's old medical records in [place]  Chief Complaint: hyperglycemia, DKA  HPI: [name] [name] is [name] [name] 57 y.o. female with medical history significant of heart failure, diabetes, hypertension, hypothyroidism, anxiety presenting with hyperglycemia and DKA.  She notes that she was feeling fine yesterday.  She woke up around 2 AM feeling poorly.  She states that she was sweating and had frequent urination.  Her blood sugars were in the 500s.  She took 10 units of insulin but her sugar down, but she still felt poorly and ended up calling EMS.  She is takes 30 units of long-acting insulin day in addition to sliding scale.  She notes her blood sugars are typically uncontrolled running in the 300s.  Is an upcoming endocrine appointment on 30 October.  She denies any fevers, cough, shortness of breath.  She did have some nausea and vomiting.  She denies any abdominal pain.  She denies any recent travel or sick contacts.  She does have chronic chest discomfort which she notes is unchanged.  She denies any smoking or alcohol use.  ED Course: Labs, imaging, EKG, insulin gtt.  Admit to hospitalist for DKA.  Review of Systems: As per HPI otherwise 10 point review of systems negative.   Past Medical History:  Diagnosis Date  . Anxiety   . B12 deficiency   . Chest pain 04/05/2017  . CHF (congestive heart failure) ([place])   . DKA (diabetic ketoacidoses) ([place]) 05/25/2018  . GERD (gastroesophageal reflux disease)   . Hyperlipidemia   . Hypertension    [name]  . Hypothyroidism   . IBS (irritable bowel syndrome)   . Intermittent vertigo   . 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 Leg cramping    "@ night" (09/18/2013)  . Migraine    "once q couple months" (09/18/2013)  . Osteoporosis   .  Peripheral neuropathy   . Type II diabetes mellitus ([place])   . Umbilical hernia    unrepaired (09/18/2013)    Past Surgical History:  Procedure Laterality Date  . CESAREAN SECTION  1989  . LAPAROSCOPIC ASSISTED VAGINAL HYSTERECTOMY  09/23/2012   Procedure: LAPAROSCOPIC ASSISTED VAGINAL HYSTERECTOMY;  Surgeon: Linda Hedges, DO;  Location: [facility];  Service: Gynecology;  Laterality: N/Shanele Nissan;  pull Dr Gregor Hams instrument  . LEFT HEART CATH AND CORONARY ANGIOGRAPHY N/Gloria Lambertson 02/11/2017   Procedure: Left Heart Cath and Coronary Angiography;  Surgeon: Lorretta Harp, MD;  Location: [facility];  Service: Cardiovascular;  Laterality: N/Lister Brizzi;  . TUBAL LIGATION Bilateral 1992     reports that she quit smoking about 27 years ago. Her smoking use included cigarettes. She quit after 0.20 years of use. She has never used smokeless tobacco. She reports that she has current or past drug history. Drug: Marijuana. Frequency: 3.00 times per week. She reports that she does not drink alcohol.  Allergies  Allergen Reactions  . Codeine Hives, Anxiety and Other (See Comments)    Family History  Problem Relation Age of Onset  . Diabetes Other   . Heart disease Other   . Heart failure Mother 63       Her heart skipped Acelin Ferdig beat and stopped - per pt  . Diabetes Maternal Grandmother   . Alzheimer's disease Maternal Grandmother   . Coronary artery  disease Maternal Grandmother   . Heart attack Maternal Grandfather   . Heart failure Brother    Prior to Admission medications   Medication Sig Start Date End Date Taking? Authorizing Provider  aspirin 81 MG chewable tablet Chew 1 tablet (81 mg total) by mouth daily. 02/12/17  Yes Jani Gravel, MD  atorvastatin (LIPITOR) 80 MG tablet Take 1 tablet (80 mg total) by mouth daily at 6 PM. 03/13/18  Yes Bedsole, Amy E, MD  ezetimibe (ZETIA) 10 MG tablet Take 1 tablet (10 mg total) by mouth daily. 09/11/16  Yes Bedsole, Amy E, MD  furosemide (LASIX) 20 MG tablet Take 1 tablet (20 mg  total) by mouth daily. Hold for the next 2 days, then resume on 08/08/2018 08/05/18 11/03/18 Yes Elgergawy, Silver Huguenin, MD  insulin glargine (LANTUS) 100 UNIT/ML injection Inject 0.3 mLs (30 Units total) into the skin daily. 09/12/18  Yes Ghimire, Henreitta Leber, MD  insulin lispro (HUMALOG KWIKPEN) 100 UNIT/ML KiwkPen Inject into skin 3 times Ymani Porcher day at meal time according to the following sliding scale: CBG 70-120 - 0 units / CBG 121-150 - 1 unit / CBG 151-200 - 2 units / CBG 201-250 - 3 units / CBG 251-300 - 5 units / CBG 301-350 - 7 units / CBG 351-400+ - 9 units  YOU MUST FOLLOW Yuvan Medinger STRICT DIABETIC DIET 07/01/18  Yes Bedsole, Amy E, MD  levothyroxine (SYNTHROID, LEVOTHROID) 150 MCG tablet TAKE 1 TABLET BY MOUTH EVERY DAY 06/03/18  Yes Bedsole, Amy E, MD  losartan (COZAAR) 25 MG tablet TAKE 1 TABLET BY MOUTH EVERY DAY 08/27/18  Yes Barrett, Evelene Croon, PA-C  metoCLOPramide (REGLAN) 5 MG tablet TAKE 1 TABLET BY MOUTH 4 TIMES Jakolby Sedivy DAY BEFORE MEALS AND AT BEDTIME Patient taking differently: Take 5 mg by mouth See admin instructions. TAKE 1 TABLET BY MOUTH 4 TIMES Alazay Leicht DAY BEFORE MEALS AND AT BEDTIME 08/12/18  Yes Bedsole, Amy E, MD  metoprolol succinate (TOPROL-XL) 25 MG 24 hr tablet Take 25 mg by mouth daily. 03/24/17  Yes [provider]  nitroGLYCERIN (NITRODUR - DOSED IN MG/24 HR) 0.2 mg/hr patch APPLY 1 PATCH TO SKIN EVERY DAY AS NEEDED Patient taking differently: Place 0.2 mg onto the skin daily.  08/27/18  Yes Barrett, Evelene Croon, PA-C  pantoprazole (PROTONIX) 40 MG tablet Take 1 tablet (40 mg total) by mouth daily at 12 noon. 01/28/18  Yes Bedsole, Amy E, MD  sertraline (ZOLOFT) 50 MG tablet TAKE 1 TABLET BY MOUTH EVERY DAY 06/19/18  Yes Bedsole, Amy E, MD  Insulin Pen Needle (PEN NEEDLES) 31G X 8 MM MISC Use to inject insulin 4 times Jacques Willingham day.  Dx: E11.10 12/23/17   Jinny Sanders, MD  Lancets (ONETOUCH ULTRASOFT) lancets Use to check blood sugar daily as needed.  Dx: E11.10 12/23/17   Jinny Sanders, MD    ONETOUCH VERIO test strip USE TO CHECK BLOOD SUGAR DAILY AS NEEDED. DX: E11.10 09/21/18   Bedsole, Amy E, MD  RELPAX 40 MG tablet ONE TABLET BY MOUTH AT ONSET OF HEADACHE. MAY REPEAT IN 2 HOURS IF HEADACHE PERSISTS OR RECURS. Patient not taking: One tablet by mouth at onset of headache. May repeat in 2 hours if headache persists or recurs. 10/17/16   Jinny Sanders, MD    Physical Exam: Vitals:   09/22/18 1430 09/22/18 1445 09/22/18 1530 09/22/18 1600  BP: 96/67 (!) 101/59 (!) 98/54 (!) 101/59  Pulse: 94 92 99 98  Resp: 11 16 18  (!)  21  Temp:      TempSrc:      SpO2: 97% 95% 96% 95%  Weight:      Height:        Constitutional: NAD, calm, comfortable Vitals:   09/22/18 1430 09/22/18 1445 09/22/18 1530 09/22/18 1600  BP: 96/67 (!) 101/59 (!) 98/54 (!) 101/59  Pulse: 94 92 99 98  Resp: 11 16 18  (!) 21  Temp:      TempSrc:      SpO2: 97% 95% 96% 95%  Weight:      Height:       Eyes: PERRL, lids and conjunctivae normal ENMT: Mucous membranes are moist. Posterior pharynx clear of any exudate or lesions.Normal dentition.  Neck: normal, supple, no masses, no thyromegaly Respiratory: clear to auscultation bilaterally, no wheezing, no crackles. Normal respiratory effort.  Cardiovascular: Regular rate and rhythm, no murmurs / rubs / gallops. No extremity edema. 2+ pedal pulses. No carotid bruits.  Abdomen: no tenderness, no masses palpated. No hepatosplenomegaly. Bowel sounds positive.  Musculoskeletal: no clubbing / cyanosis. No joint deformity upper and lower extremities. Good ROM, no contractures. Normal muscle tone.  Skin: no rashes, lesions, ulcers. No induration Neurologic: CN 2-12 grossly intact. Sensation intact. Strength 5/5 in all 4.  Psychiatric: Normal judgment and insight. Alert and oriented x 3. Normal mood.   Labs on Admission: I have personally reviewed following labs and imaging studies  CBC: Recent Labs  Lab 09/22/18 1213 09/22/18 1406  WBC 23.8*  --   HGB  16.5* 17.0*  HCT 49.9* 50.0*  MCV 85.9  --   PLT 470*  --    Basic Metabolic Panel: Recent Labs  Lab 09/22/18 1213 09/22/18 1406  NA 135 132*  K 3.6 4.4  CL 94* 97*  CO2 17*  --   GLUCOSE 502* 510*  BUN 14 17  CREATININE 0.79 0.40*  CALCIUM 9.8  --    GFR: Estimated Creatinine Clearance: 64.3 mL/min (Allice Garro) (by C-G formula based on SCr of 0.4 mg/dL (L)). Liver Function Tests: No results for input(s): AST, ALT, ALKPHOS, BILITOT, PROT, ALBUMIN in the last 168 hours. No results for input(s): LIPASE, AMYLASE in the last 168 hours. No results for input(s): AMMONIA in the last 168 hours. Coagulation Profile: No results for input(s): INR, PROTIME in the last 168 hours. Cardiac Enzymes: No results for input(s): CKTOTAL, CKMB, CKMBINDEX, TROPONINI in the last 168 hours. BNP (last 3 results) No results for input(s): PROBNP in the last 8760 hours. HbA1C: No results for input(s): HGBA1C in the last 72 hours. CBG: Recent Labs  Lab 09/22/18 1201 09/22/18 1442 09/22/18 1559  GLUCAP 421* 417* 282*   Lipid Profile: No results for input(s): CHOL, HDL, LDLCALC, TRIG, CHOLHDL, LDLDIRECT in the last 72 hours. Thyroid Function Tests: No results for input(s): TSH, T4TOTAL, FREET4, T3FREE, THYROIDAB in the last 72 hours. Anemia Panel: No results for input(s): VITAMINB12, FOLATE, FERRITIN, TIBC, IRON, RETICCTPCT in the last 72 hours. Urine analysis:    Component Value Date/Time   COLORURINE STRAW (Faylynn Stamos) 09/22/2018 1358   APPEARANCEUR CLEAR 09/22/2018 1358   LABSPEC 1.027 09/22/2018 1358   PHURINE 6.0 09/22/2018 1358   GLUCOSEU >=500 (Eufemio Strahm) 09/22/2018 1358   HGBUR NEGATIVE 09/22/2018 1358   [place] 09/22/2018 1358   BILIRUBINUR negative 09/11/2016 1038   KETONESUR 80 (Telisa Ohlsen) 09/22/2018 1358   PROTEINUR 100 (Stephannie Broner) 09/22/2018 1358   UROBILINOGEN 0.2 09/11/2016 1038   UROBILINOGEN 0.2 10/28/2013 1723   NITRITE NEGATIVE 09/22/2018 1358  LEUKOCYTESUR NEGATIVE 09/22/2018 1358     Radiological Exams on Admission: Dg Chest Portable 1 View  Result Date: 09/22/2018 CLINICAL DATA:  Initial evaluation for DKA. EXAM: PORTABLE CHEST 1 VIEW COMPARISON:  Prior radiograph from 09/11/2018. FINDINGS: The cardiac and mediastinal silhouettes are stable in size and contour, and remain within normal limits. The lungs are normally inflated. No airspace consolidation, pleural effusion, or pulmonary edema is identified. There is no pneumothorax. No acute osseous abnormality identified. IMPRESSION: No active cardiopulmonary disease. Electronically Signed   By: Jeannine Boga M.D.   On: 09/22/2018 14:12    EKG: Independently reviewed. Sinus tachycardia.  Prolonged Qtc.Marland Kitchen  Appears similar to priors.  Assessment/Plan Active Problems:   DKA (diabetic ketoacidoses) ([place])  DKA: unclear precipitant, but multiple recent admissions for the same.  Component of nonadherence suspected in previous notes.   Initial BMP with bicarb 17 and AG 24, VBG with normal pH, but with concomitant resp alkalosis and metabolic acidosis.  Urine + ketones. UA and CXR without evidence of infection Insulin gtt - home regimen was 30 lantus with SSI Q4 BMP IVF Diabetic coordinator for education Continue ASA/statin Follow hemoglobin A1c  Anion Gap Metabolic Acidosis  Lactic Acidosis  DKA: treating DKA as above, follow lactic acidosis with IVF  Diabetic Gastroparesis: continue reglan  Nausea  Vomiting: no abdominal pain on exam, follow, occurred in ED, suspect 2/2 above.  Follow HFP and lipase.   Leukocytosis: no si/sx of infection, follow without abx for now.  CTM closely, suspect reactive component (Hb and plts also elevated, suspect component of hemoconcentration as well).  Hypertension: holding lasix and losartan, continue metoprolol  Chronic Chest Pain: she notes this is unchanged.  She uses [place] Stay nitro patch daily for this.  Follows with Dr. Percival Spanish and no further w/u planned as of  12/2017  Prolonged QTc: follow mag, caution with qt prolonging meds.  Repeat EKG in AM  Hx HFrEF: caution with IVF, follow volume status.  Normal EF noted on stress test from 04/2018.  Hypothyroidism: synthroid  HLD: continue zetia/atorvastatin  Depression: continue zoloft  GERD: PPI  DVT prophylaxis: lovenox Code Status: full  Family Communication: none at bedside  Disposition Plan: pending improvement  Consults called: none Admission status: inpatient given DKA which will likely take greater than 2 midnights for evaluation and treatment especially in the setting of her frequent admissions.     Fayrene Helper MD [facility] Pager [phone]  If 7PM-7AM, please contact night-coverage [email or web address] Password [facility]  09/22/2018, 4:38 PM

## 2018-09-23 DIAGNOSIS — E1110 Type 2 diabetes mellitus with ketoacidosis without coma: Secondary | ICD-10-CM | POA: Diagnosis not present

## 2018-09-23 DIAGNOSIS — I5022 Chronic systolic (congestive) heart failure: Secondary | ICD-10-CM

## 2018-09-23 DIAGNOSIS — I10 Essential (primary) hypertension: Secondary | ICD-10-CM | POA: Diagnosis not present

## 2018-09-23 DIAGNOSIS — E1010 Type 1 diabetes mellitus with ketoacidosis without coma: Secondary | ICD-10-CM | POA: Diagnosis not present

## 2018-09-23 DIAGNOSIS — E1122 Type 2 diabetes mellitus with diabetic chronic kidney disease: Secondary | ICD-10-CM | POA: Diagnosis not present

## 2018-09-23 DIAGNOSIS — N182 Chronic kidney disease, stage 2 (mild): Secondary | ICD-10-CM | POA: Diagnosis not present

## 2018-09-23 DIAGNOSIS — I130 Hypertensive heart and chronic kidney disease with heart failure and stage 1 through stage 4 chronic kidney disease, or unspecified chronic kidney disease: Secondary | ICD-10-CM | POA: Diagnosis not present

## 2018-09-23 DIAGNOSIS — E039 Hypothyroidism, unspecified: Secondary | ICD-10-CM

## 2018-09-23 DIAGNOSIS — D72829 Elevated white blood cell count, unspecified: Secondary | ICD-10-CM

## 2018-09-23 DIAGNOSIS — I428 Other cardiomyopathies: Secondary | ICD-10-CM

## 2018-09-23 DIAGNOSIS — E1143 Type 2 diabetes mellitus with diabetic autonomic (poly)neuropathy: Secondary | ICD-10-CM

## 2018-09-23 DIAGNOSIS — K3184 Gastroparesis: Secondary | ICD-10-CM

## 2018-09-23 LAB — BASIC METABOLIC PANEL
Anion gap: 8 (ref 5–15)
Anion gap: 8 (ref 5–15)
BUN: 11 mg/dL (ref 6–20)
BUN: 10 mg/dL (ref 6–20)
CO2: 24 mmol/L (ref 22–32)
CO2: 20 mmol/L — ABNORMAL LOW (ref 22–32)
CREATININE: 0.43 mg/dL — AB (ref 0.44–1.00)
Calcium: 8.5 mg/dL — ABNORMAL LOW (ref 8.9–10.3)
Calcium: 8.8 mg/dL — ABNORMAL LOW (ref 8.9–10.3)
Chloride: 110 mmol/L (ref 98–111)
Chloride: 107 mmol/L (ref 98–111)
Creatinine, Ser: 0.61 mg/dL (ref 0.44–1.00)
GFR calc Af Amer: >60 mL/min (ref >60)
GFR calc Af Amer: >60 mL/min (ref >60)
GFR calc non Af Amer: >60 mL/min (ref >60)
GLUCOSE: 115 mg/dL — AB (ref 70–99)
Glucose, Bld: 296 mg/dL — ABNORMAL HIGH (ref 70–99)
Potassium: 3.4 mmol/L — ABNORMAL LOW (ref 3.5–5.1)
Potassium: 3.8 mmol/L (ref 3.5–5.1)
SODIUM: 142 mmol/L (ref 135–145)
Sodium: 135 mmol/L (ref 135–145)

## 2018-09-23 LAB — GLUCOSE, CAPILLARY
GLUCOSE-CAPILLARY: 115 mg/dL — AB (ref 70–99)
Glucose-Capillary: 140 mg/dL — ABNORMAL HIGH (ref 70–99)
Glucose-Capillary: 105 mg/dL — ABNORMAL HIGH (ref 70–99)
Glucose-Capillary: 196 mg/dL — ABNORMAL HIGH (ref 70–99)

## 2018-09-23 LAB — CBC
HCT: 39.8 % (ref 36.0–46.0)
HEMOGLOBIN: 13.1 g/dL (ref 12.0–15.0)
MCH: 28.4 pg (ref 26.0–34.0)
MCHC: 32.9 g/dL (ref 30.0–36.0)
MCV: 86.3 fL (ref 80.0–100.0)
NRBC: 0 % (ref 0.0–0.2)
PLATELETS: 326 10*3/uL (ref 150–400)
RBC: 4.61 MIL/uL (ref 3.87–5.11)
RDW: 14.3 % (ref 11.5–15.5)
WBC: 13.5 10*3/uL — ABNORMAL HIGH (ref 4.0–10.5)

## 2018-09-23 LAB — COMPREHENSIVE METABOLIC PANEL
ALBUMIN: 3.1 g/dL — AB (ref 3.5–5.0)
ALK PHOS: 60 U/L (ref 38–126)
ALT: 13 U/L (ref 0–44)
AST: 13 U/L — ABNORMAL LOW (ref 15–41)
Anion gap: 9 (ref 5–15)
BUN: 10 mg/dL (ref 6–20)
CALCIUM: 8.3 mg/dL — AB (ref 8.9–10.3)
CO2: 22 mmol/L (ref 22–32)
Chloride: 107 mmol/L (ref 98–111)
Creatinine, Ser: 0.47 mg/dL (ref 0.44–1.00)
GFR calc non Af Amer: >60 mL/min (ref >60)
GLUCOSE: 150 mg/dL — AB (ref 70–99)
POTASSIUM: 3.7 mmol/L (ref 3.5–5.1)
SODIUM: 138 mmol/L (ref 135–145)
Total Bilirubin: 1 mg/dL (ref 0.3–1.2)
Total Protein: 5.7 g/dL — ABNORMAL LOW (ref 6.5–8.1)

## 2018-09-23 MED ORDER — FUROSEMIDE 10 MG/ML IJ SOLN
20.0000 mg | Freq: Once | INTRAMUSCULAR | Status: AC
  Administered 2018-09-23: 20 mg via INTRAVENOUS
  Filled 2018-09-23: qty 2

## 2018-09-23 MED ORDER — INSULIN ASPART 100 UNIT/ML ~~LOC~~ SOLN
3.0000 [IU] | Freq: Three times a day (TID) | SUBCUTANEOUS | Status: DC
  Administered 2018-09-23: 3 [IU] via SUBCUTANEOUS

## 2018-09-23 MED ORDER — INSULIN ASPART 100 UNIT/ML ~~LOC~~ SOLN: 0.0000 [IU] | Freq: Three times a day (TID) | SUBCUTANEOUS | Status: DC

## 2018-09-23 MED ORDER — INSULIN ASPART 100 UNIT/ML ~~LOC~~ SOLN
0.0000 [IU] | Freq: Three times a day (TID) | SUBCUTANEOUS | Status: DC
  Administered 2018-09-23: 3 [IU] via SUBCUTANEOUS

## 2018-09-23 MED ORDER — INSULIN ASPART 100 UNIT/ML ~~LOC~~ SOLN: 0.0000 [IU] | Freq: Every day | SUBCUTANEOUS | Status: DC

## 2018-09-23 NOTE — Progress Notes (Signed)
Orders to discharge pt. to home. Pt. given discharge instructions. PIV x 2 removed. Pt. walked to car with belongings.

## 2018-09-23 NOTE — Progress Notes (Signed)
Inpatient Diabetes Program Recommendations  AACE/ADA: New Consensus Statement on Inpatient Glycemic Control (2015)  Target Ranges:  Prepandial:   less than 140 mg/dL      Peak postprandial:   less than 180 mg/dL (1-2 hours)      Critically ill patients:  140 - 180 mg/dL   Results for [name] [name], [name] [name] (MRN [id number]) as of 09/23/2018 08:46  Ref. Range 09/22/2018 17:57 09/22/2018 18:53 09/22/2018 19:54 09/22/2018 20:58 09/22/2018 22:02 09/22/2018 23:13  Glucose-Capillary Latest Ref Range: 70 - 99 mg/dL 224 (H) 225 (H) 184 (H) 152 (H) 119 (H) 140 (H)  20 units LANTUS   Results for [name] [name], [name] [name] (MRN [id number]) as of 09/23/2018 08:46  Ref. Range 09/23/2018 00:17 09/23/2018 01:20 09/23/2018 07:43  Glucose-Capillary Latest Ref Range: 70 - 99 mg/dL 115 (H) 105 (H) 196 (H)  6 units NOVOLOG     Admit DKA (just d/c'd on 10/04 after admit for DKA on 10/03)  History: DM, CKD, CHF  Home DM Meds: Lantus 30 units Daily            Humalog 0-9 units TID per SSI  Current Orders: Lantus 20 units Daily       Novolog Moderate Correction Scale/ SSI (0-15 units) TID AC + HS      Novolog 3 units TID with meals     PCP: [place]--Last seen July 2019--Supposedly has appt with New ENDO at the end of the month (Dr. Cruzita Lederer fired her for missed appts)  Last A1c was 14.5% back in August. DM Coordinator counseled pt extensively back in June of this year.   This is pt's 7th admission since January for DKA.   Transitioned off the IV Insulin drip to SQ Insulin last PM.  CBG much improved this AM.    Has orders to d/c home today.     --Will follow patient during hospitalization--  Wyn Quaker RN, MSN, CDE Diabetes Coordinator Inpatient Glycemic Control Team Team Pager: [phone] (8a-5p)

## 2018-09-23 NOTE — Discharge Instructions (Signed)

## 2018-09-23 NOTE — Care Management Note (Signed)
Case Management Note  Patient Details  Name: [name] [name] MRN: [id number] Date of Birth: Dec 27, 1960  Subjective/Objective:                  Discharge planning  Action/Plan: No cm needs present, has been seen by the diabetes coordinator.  Expected Discharge Date:  09/23/18               Expected Discharge Plan:  Home/Self Care  In-House Referral:     Discharge planning Services  CM Consult  Post Acute Care Choice:    Choice offered to:     DME Arranged:    DME Agency:     HH Arranged:    HH Agency:     Status of Service:  Completed, signed off  If discussed at [employer] of Stay Meetings, dates discussed:    Additional Comments:  Leeroy Cha, RN 09/23/2018, 8:35 AM

## 2018-09-23 NOTE — Discharge Summary (Addendum)
Physician Discharge Summary  [name] [name] KGU:[id number] DOB: October 31, 1961 DOA: 09/22/2018  PCP: Jinny Sanders, MD  Admit date: 09/22/2018 Discharge date: 10/01/2018  Admitted From: Home Disposition:  Home  Recommendations for Outpatient Follow-up:  1. Follow up with PCP in 1-2 weeks 2. Please obtain BMP/CBC in one week 3.   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  She has been transitioned to long-acting insulin will allow a diet start sliding scale insulin. UA and chest x-ray shows no signs of infection Likely due to noncompliance she is tolerating her diet will continue current dose of her insulin.  Diabetic gastroparesis: Continue Reglan.  Leukocytosis: Remain afebrile with a persistent leukocytosis, likely stress demargination.  Essential hypertension: Resume home medications no changes made.  Chronic systolic heart failure/Nonischemic cardiomyopathy [facility]): Resume home medications no changes made.  Noncompliance   Discharge Instructions  Discharge Instructions    Diet - low sodium heart healthy   Complete by:  As directed    Increase activity slowly   Complete  by:  As directed      Allergies as of 09/23/2018      Reactions   Codeine Hives, Anxiety, Other (See Comments)      Medication List    TAKE these medications   aspirin 81 MG chewable tablet Chew 1 tablet (81 mg total) by mouth daily.   atorvastatin 80 MG tablet Commonly known as:  LIPITOR Take 1 tablet (80 mg total) by mouth daily at 6 PM.   ezetimibe 10 MG tablet Commonly known as:  ZETIA Take 1 tablet (10 mg total) by mouth daily.   furosemide 20 MG tablet Commonly known as:  LASIX Take 1 tablet (20 mg total) by mouth daily. Hold for the next 2 days, then resume on 08/08/2018   insulin glargine 100 UNIT/ML injection Commonly known as:  LANTUS Inject 0.3 mLs (30 Units total) into the skin daily.   insulin lispro 100 UNIT/ML KiwkPen Commonly known as:  HUMALOG Inject into skin 3 times a day at meal time according to the following sliding scale: CBG 70-120 - 0 units / CBG 121-150 - 1 unit / CBG 151-200 - 2 units / CBG 201-250 - 3 units / CBG 251-300 - 5 units / CBG 301-350 - 7 units / CBG 351-400+ - 9 units  YOU MUST FOLLOW A STRICT DIABETIC DIET   levothyroxine 150 MCG tablet Commonly known as:  SYNTHROID, LEVOTHROID TAKE 1 TABLET BY MOUTH EVERY DAY   losartan 25 MG tablet Commonly known as:  COZAAR TAKE 1 TABLET BY MOUTH EVERY DAY   metoCLOPramide 5 MG tablet Commonly known as:  REGLAN TAKE 1 TABLET BY MOUTH 4 TIMES A DAY BEFORE MEALS AND AT BEDTIME What changed:  See  the new instructions.   metoprolol succinate 25 MG 24 hr tablet Commonly known as:  TOPROL-XL Take 25 mg by mouth daily.   nitroGLYCERIN 0.2 mg/hr patch Commonly known as:  NITRODUR - Dosed in mg/24 hr APPLY 1 PATCH TO SKIN EVERY DAY AS NEEDED What changed:  See the new instructions.   onetouch ultrasoft lancets Use to check blood sugar daily as needed.  Dx: E11.10   ONETOUCH VERIO test strip Generic drug:  glucose blood USE TO CHECK BLOOD SUGAR DAILY AS NEEDED. DX: E11.10    pantoprazole 40 MG tablet Commonly known as:  PROTONIX Take 1 tablet (40 mg total) by mouth daily at 12 noon.   Pen Needles 31G X 8 MM Misc Use to inject insulin 4 times a day.  Dx: E11.10   RELPAX 40 MG tablet Generic drug:  eletriptan ONE TABLET BY MOUTH AT ONSET OF HEADACHE. MAY REPEAT IN 2 HOURS IF HEADACHE PERSISTS OR RECURS.   sertraline 50 MG tablet Commonly known as:  ZOLOFT TAKE 1 TABLET BY MOUTH EVERY DAY       Allergies  Allergen Reactions  . Codeine Hives, Anxiety and Other (See Comments)    Consultations:  None   Procedures/Studies: Dg Chest 2 View  Result Date: 09/11/2018 CLINICAL DATA:  Nausea, vomiting, cough, hyperglycemia. Known diabetes. Former smoker. EXAM: CHEST - 2 VIEW COMPARISON:  PA and lateral chest x-ray of May 26, 2018 FINDINGS: The lungs are adequately inflated and clear. The heart and pulmonary vascularity are normal. The mediastinum is normal in width. There is no pleural effusion. The bony thorax exhibits no acute abnormality. IMPRESSION: There is no active cardiopulmonary disease. Electronically Signed   By: David  [place] M.D.   On: 09/11/2018 11:57   Ct Abdomen Pelvis W Contrast  Result Date: 09/11/2018 CLINICAL DATA:  57 year old female with acute LOWER abdominal and pelvic pain for several months. Nausea and vomiting for 1 day. EXAM: CT ABDOMEN AND PELVIS WITH CONTRAST TECHNIQUE: Multidetector CT imaging of the abdomen and pelvis was performed using the standard protocol following bolus administration of intravenous contrast. CONTRAST:  100 cc intravenous Isovue 370 COMPARISON:  03/06/2018 and prior CTs FINDINGS: Lower chest: No acute abnormality Hepatobiliary: Mild hepatic steatosis again noted. No focal hepatic lesions are present. No new gallbladder abnormalities are noted. Mild hyperdense prominence along the gallbladder tip is unchanged from 2009. No biliary dilatation. Pancreas: Unremarkable Spleen: Unremarkable Adrenals/Urinary Tract:  The kidneys, adrenal glands and bladder are unremarkable. Stomach/Bowel: Stomach is within normal limits. Appendix appears normal. No evidence of bowel wall thickening, distention, or inflammatory changes. Scattered colonic diverticula noted without evidence of diverticulitis. Vascular/Lymphatic: No significant vascular findings are present. No enlarged abdominal or pelvic lymph nodes. Reproductive: Status post hysterectomy. No adnexal masses. Other: No ascites, pneumoperitoneum or focal collection. A small umbilical hernia containing fat is unchanged. Musculoskeletal: No acute or suspicious bony abnormality. IMPRESSION: 1. No evidence of acute abnormality 2. Hepatic steatosis 3. Small stable umbilical hernia containing fat. Electronically Signed   By: Margarette Canada M.D.   On: 09/11/2018 13:14   Dg Chest Portable 1 View  Result Date: 09/22/2018 CLINICAL DATA:  Initial evaluation for DKA. EXAM: PORTABLE CHEST 1 VIEW COMPARISON:  Prior radiograph from 09/11/2018. FINDINGS: The cardiac and mediastinal silhouettes are stable in size and contour, and remain within normal limits. The lungs are normally inflated. No airspace consolidation, pleural effusion, or pulmonary edema is identified. There is no pneumothorax. No acute osseous abnormality identified. IMPRESSION: No active cardiopulmonary  disease. Electronically Signed   By: Jeannine Boga M.D.   On: 09/22/2018 14:12    Subjective: No complaints feels great tolerating her diet.  Discharge Exam: Vitals:   09/23/18 0745 09/23/18 0755  BP: (!) 137/93   Pulse: 89   Resp: 11   Temp:  98.6 F (37 C)  SpO2: 95%    Vitals:   09/23/18 0000 09/23/18 0100 09/23/18 0745 09/23/18 0755  BP:  (!) 109/59 (!) 137/93   Pulse:  85 89   Resp:  17 11   Temp: 98.7 F (37.1 C)   98.6 F (37 C)  TempSrc: Oral   Oral  SpO2:  95% 95%   Weight:      Height:        General: Pt is alert, awake, not in acute distress Cardiovascular: RRR, S1/S2 +, no rubs, no  gallops Respiratory: CTA bilaterally, no wheezing, no rhonchi Abdominal: Soft, NT, ND, bowel sounds + Extremities: no edema, no cyanosis    The results of significant diagnostics from this hospitalization (including imaging, microbiology, ancillary and laboratory) are listed below for reference.     Microbiology: No results found for this or any previous visit (from the past 240 hour(s)).   Labs: BNP (last 3 results) No results for input(s): BNP in the last 8760 hours. Basic Metabolic Panel: No results for input(s): NA, K, CL, CO2, GLUCOSE, BUN, CREATININE, CALCIUM, MG, PHOS in the last 168 hours. Liver Function Tests: No results for input(s): AST, ALT, ALKPHOS, BILITOT, PROT, ALBUMIN in the last 168 hours. No results for input(s): LIPASE, AMYLASE in the last 168 hours. No results for input(s): AMMONIA in the last 168 hours. CBC: No results for input(s): WBC, NEUTROABS, HGB, HCT, MCV, PLT in the last 168 hours. Cardiac Enzymes: No results for input(s): CKTOTAL, CKMB, CKMBINDEX, TROPONINI in the last 168 hours. BNP: Invalid input(s): POCBNP CBG: No results for input(s): GLUCAP in the last 168 hours. D-Dimer No results for input(s): DDIMER in the last 72 hours. Hgb A1c No results for input(s): HGBA1C in the last 72 hours. Lipid Profile No results for input(s): CHOL, HDL, LDLCALC, TRIG, CHOLHDL, LDLDIRECT in the last 72 hours. Thyroid function studies No results for input(s): TSH, T4TOTAL, T3FREE, THYROIDAB in the last 72 hours.  Invalid input(s): FREET3 Anemia work up No results for input(s): VITAMINB12, FOLATE, FERRITIN, TIBC, IRON, RETICCTPCT in the last 72 hours. Urinalysis    Component Value Date/Time   COLORURINE STRAW (A) 09/22/2018 1358   APPEARANCEUR CLEAR 09/22/2018 1358   LABSPEC 1.027 09/22/2018 1358   PHURINE 6.0 09/22/2018 1358   GLUCOSEU >=500 (A) 09/22/2018 1358   HGBUR NEGATIVE 09/22/2018 1358   BILIRUBINUR NEGATIVE 09/22/2018 1358   BILIRUBINUR  negative 09/11/2016 1038   KETONESUR 80 (A) 09/22/2018 1358   PROTEINUR 100 (A) 09/22/2018 1358   UROBILINOGEN 0.2 09/11/2016 1038   UROBILINOGEN 0.2 10/28/2013 1723   NITRITE NEGATIVE 09/22/2018 1358   LEUKOCYTESUR NEGATIVE 09/22/2018 1358   Sepsis Labs Invalid input(s): PROCALCITONIN,  WBC,  LACTICIDVEN Microbiology No results found for this or any previous visit (from the past 240 hour(s)).   Time coordinating discharge: 40 minutes  SIGNED:   Charlynne Cousins, MD  [facility] 10/01/2018, 2:20 PM Pager   If 7PM-7AM, please contact night-coverage [email or web address] Password [email or web address]

## 2018-09-24 ENCOUNTER — Telehealth: Payer: Self-pay

## 2018-09-24 LAB — HEMOGLOBIN A1C: Hgb A1c MFr Bld: >15.5 % — ABNORMAL HIGH (ref 4.8–5.6)

## 2018-09-24 NOTE — Telephone Encounter (Signed)
Left message for patient to call back and do TCM call and schedule hospital follow up-Mylz Yuan Estell Harpin, RMA

## 2018-09-25 ENCOUNTER — Telehealth: Payer: Self-pay

## 2018-09-25 MED ORDER — PROMETHAZINE HCL 25 MG PO TABS: 25.0000 mg | ORAL_TABLET | Freq: Three times a day (TID) | ORAL | 0 refills | Status: DC | PRN

## 2018-09-25 NOTE — Telephone Encounter (Signed)
Transition Care Management Follow-up Telephone Call   Date discharged? 09/23/2018   How have you been since you were released from the hospital? Feels better   Do you understand why you were in the hospital? yes   Do you understand the discharge instructions? yes   Where were you discharged to? home   Items Reviewed:  Medications reviewed: yes. Upon reviewing medication list patient states she is not using Lantus insulin because it was not on her formulary and not covered. She has been using Bsaglar insulin still at 30 units at bedtime and she has an appointment to see her Endocrinologist on 10/08/18 to discuss. She is applying Nitro to the skin once every day now.  Allergies reviewed: yes  Dietary changes reviewed: did not have to make any new changes  Referrals reviewed: yes-did not have to see any specialists besides her PCP and Endo that she already sees.   Functional Questionnaire:   Activities of Daily Living (ADLs):   She states they are independent in the following: ambulation, bathing, hygiene, feeding, grooming, toileting, continence-everything. States they require assistance with the following: none   Any transportation issues/concerns?: no   Any patient concerns? no   Confirmed importance and date/time of follow-up visits scheduled yes  Provider Appointment booked with Dr Diona Browner on 09/30/2018.  Confirmed with patient if condition begins to worsen call PCP or go to the ER.  Patient was given the office number and encouraged to call back with question or concerns.  : yes

## 2018-09-25 NOTE — Telephone Encounter (Signed)
Prescription sent in for phenergan to use prn nausea.

## 2018-09-25 NOTE — Telephone Encounter (Signed)
Mareena notified by telephone that Dr. Diona Browner sent in her a prescription for phenergan tablets to [facility].

## 2018-09-25 NOTE — Telephone Encounter (Signed)
Patient wanted to ask if you would be willing to give patient a RX for Phenergan tablets to have on hand as needed, so this way if she gets another attack with nausea she can take it and maybe it will save her from going to the hospital. She was given Zofran before but that does not work as affective for her and at the hospital after she is giving Zofran they end up giving her Phenergan after that. Please review. Thank you. Patient has hospital follow up appointment on 09/30/2018-[name] [name], RMA

## 2018-09-30 ENCOUNTER — Inpatient Hospital Stay: Payer: 59 | Admitting: Family Medicine

## 2018-09-30 ENCOUNTER — Other Ambulatory Visit: Payer: Self-pay | Admitting: Cardiology

## 2018-09-30 DIAGNOSIS — E1165 Type 2 diabetes mellitus with hyperglycemia: Secondary | ICD-10-CM | POA: Diagnosis not present

## 2018-09-30 DIAGNOSIS — I130 Hypertensive heart and chronic kidney disease with heart failure and stage 1 through stage 4 chronic kidney disease, or unspecified chronic kidney disease: Secondary | ICD-10-CM | POA: Diagnosis not present

## 2018-09-30 DIAGNOSIS — M810 Age-related osteoporosis without current pathological fracture: Secondary | ICD-10-CM

## 2018-09-30 DIAGNOSIS — Z794 Long term (current) use of insulin: Secondary | ICD-10-CM

## 2018-09-30 DIAGNOSIS — K219 Gastro-esophageal reflux disease without esophagitis: Secondary | ICD-10-CM

## 2018-09-30 DIAGNOSIS — N182 Chronic kidney disease, stage 2 (mild): Secondary | ICD-10-CM

## 2018-09-30 DIAGNOSIS — F419 Anxiety disorder, unspecified: Secondary | ICD-10-CM

## 2018-09-30 DIAGNOSIS — Z7982 Long term (current) use of aspirin: Secondary | ICD-10-CM

## 2018-09-30 DIAGNOSIS — E1122 Type 2 diabetes mellitus with diabetic chronic kidney disease: Secondary | ICD-10-CM | POA: Diagnosis not present

## 2018-09-30 DIAGNOSIS — E1142 Type 2 diabetes mellitus with diabetic polyneuropathy: Secondary | ICD-10-CM

## 2018-09-30 DIAGNOSIS — Z87891 Personal history of nicotine dependence: Secondary | ICD-10-CM

## 2018-09-30 DIAGNOSIS — K589 Irritable bowel syndrome without diarrhea: Secondary | ICD-10-CM

## 2018-09-30 DIAGNOSIS — E785 Hyperlipidemia, unspecified: Secondary | ICD-10-CM

## 2018-09-30 DIAGNOSIS — I5022 Chronic systolic (congestive) heart failure: Secondary | ICD-10-CM

## 2018-09-30 DIAGNOSIS — Z9114 Patient's other noncompliance with medication regimen: Secondary | ICD-10-CM

## 2018-10-06 ENCOUNTER — Other Ambulatory Visit: Payer: Self-pay | Admitting: Cardiology

## 2018-10-19 ENCOUNTER — Inpatient Hospital Stay (HOSPITAL_COMMUNITY): Payer: 59

## 2018-10-19 ENCOUNTER — Encounter (HOSPITAL_COMMUNITY): Payer: Self-pay | Admitting: Emergency Medicine

## 2018-10-19 ENCOUNTER — Observation Stay (HOSPITAL_COMMUNITY)
Admission: EM | Admit: 2018-10-19 | Discharge: 2018-10-20 | Disposition: A | Discharge status: Home / Self Care | Payer: 59 | Attending: Internal Medicine | Admitting: Internal Medicine

## 2018-10-19 ENCOUNTER — Other Ambulatory Visit: Payer: Self-pay

## 2018-10-19 DIAGNOSIS — I10 Essential (primary) hypertension: Secondary | ICD-10-CM | POA: Diagnosis present

## 2018-10-19 DIAGNOSIS — F411 Generalized anxiety disorder: Secondary | ICD-10-CM | POA: Diagnosis present

## 2018-10-19 DIAGNOSIS — K219 Gastro-esophageal reflux disease without esophagitis: Secondary | ICD-10-CM | POA: Diagnosis not present

## 2018-10-19 DIAGNOSIS — E039 Hypothyroidism, unspecified: Secondary | ICD-10-CM | POA: Diagnosis not present

## 2018-10-19 DIAGNOSIS — R05 Cough: Secondary | ICD-10-CM

## 2018-10-19 DIAGNOSIS — Z79899 Other long term (current) drug therapy: Secondary | ICD-10-CM | POA: Insufficient documentation

## 2018-10-19 DIAGNOSIS — I130 Hypertensive heart and chronic kidney disease with heart failure and stage 1 through stage 4 chronic kidney disease, or unspecified chronic kidney disease: Secondary | ICD-10-CM | POA: Insufficient documentation

## 2018-10-19 DIAGNOSIS — Z7982 Long term (current) use of aspirin: Secondary | ICD-10-CM | POA: Insufficient documentation

## 2018-10-19 DIAGNOSIS — I5022 Chronic systolic (congestive) heart failure: Secondary | ICD-10-CM | POA: Diagnosis not present

## 2018-10-19 DIAGNOSIS — Z794 Long term (current) use of insulin: Secondary | ICD-10-CM | POA: Diagnosis not present

## 2018-10-19 DIAGNOSIS — E1122 Type 2 diabetes mellitus with diabetic chronic kidney disease: Secondary | ICD-10-CM | POA: Insufficient documentation

## 2018-10-19 DIAGNOSIS — N182 Chronic kidney disease, stage 2 (mild): Secondary | ICD-10-CM | POA: Diagnosis present

## 2018-10-19 DIAGNOSIS — K3184 Gastroparesis: Secondary | ICD-10-CM | POA: Insufficient documentation

## 2018-10-19 DIAGNOSIS — Z87891 Personal history of nicotine dependence: Secondary | ICD-10-CM | POA: Insufficient documentation

## 2018-10-19 DIAGNOSIS — G43909 Migraine, unspecified, not intractable, without status migrainosus: Secondary | ICD-10-CM | POA: Diagnosis not present

## 2018-10-19 DIAGNOSIS — E1165 Type 2 diabetes mellitus with hyperglycemia: Secondary | ICD-10-CM | POA: Diagnosis not present

## 2018-10-19 DIAGNOSIS — E1143 Type 2 diabetes mellitus with diabetic autonomic (poly)neuropathy: Secondary | ICD-10-CM | POA: Diagnosis present

## 2018-10-19 DIAGNOSIS — Z79890 Hormone replacement therapy (postmenopausal): Secondary | ICD-10-CM | POA: Diagnosis not present

## 2018-10-19 DIAGNOSIS — G8929 Other chronic pain: Secondary | ICD-10-CM | POA: Diagnosis not present

## 2018-10-19 DIAGNOSIS — K589 Irritable bowel syndrome without diarrhea: Secondary | ICD-10-CM | POA: Diagnosis present

## 2018-10-19 DIAGNOSIS — R739 Hyperglycemia, unspecified: Secondary | ICD-10-CM | POA: Diagnosis present

## 2018-10-19 DIAGNOSIS — E1110 Type 2 diabetes mellitus with ketoacidosis without coma: Secondary | ICD-10-CM | POA: Diagnosis not present

## 2018-10-19 DIAGNOSIS — E785 Hyperlipidemia, unspecified: Secondary | ICD-10-CM | POA: Diagnosis present

## 2018-10-19 DIAGNOSIS — I252 Old myocardial infarction: Secondary | ICD-10-CM | POA: Insufficient documentation

## 2018-10-19 DIAGNOSIS — R079 Chest pain, unspecified: Secondary | ICD-10-CM

## 2018-10-19 DIAGNOSIS — E1142 Type 2 diabetes mellitus with diabetic polyneuropathy: Secondary | ICD-10-CM | POA: Insufficient documentation

## 2018-10-19 DIAGNOSIS — F329 Major depressive disorder, single episode, unspecified: Secondary | ICD-10-CM | POA: Diagnosis not present

## 2018-10-19 DIAGNOSIS — E1149 Type 2 diabetes mellitus with other diabetic neurological complication: Secondary | ICD-10-CM | POA: Diagnosis present

## 2018-10-19 DIAGNOSIS — R059 Cough, unspecified: Secondary | ICD-10-CM

## 2018-10-19 LAB — URINALYSIS, ROUTINE W REFLEX MICROSCOPIC
BILIRUBIN URINE: NEGATIVE
KETONES UR: 80 mg/dL — AB
LEUKOCYTES UA: NEGATIVE
NITRITE: NEGATIVE
PH: 5 (ref 5.0–8.0)
Protein, ur: 100 mg/dL — AB
SPECIFIC GRAVITY, URINE: 1.026 (ref 1.005–1.030)

## 2018-10-19 LAB — CBC WITH DIFFERENTIAL/PLATELET
ABS IMMATURE GRANULOCYTES: 0.21 10*3/uL — AB (ref 0.00–0.07)
BASOS PCT: 0 %
Basophils Absolute: 0.1 10*3/uL (ref 0.0–0.1)
EOS ABS: 0 10*3/uL (ref 0.0–0.5)
EOS PCT: 0 %
HCT: 50.3 % — ABNORMAL HIGH (ref 36.0–46.0)
Hemoglobin: 16.3 g/dL — ABNORMAL HIGH (ref 12.0–15.0)
Immature Granulocytes: 1 %
Lymphocytes Relative: 7 %
Lymphs Abs: 1.2 10*3/uL (ref 0.7–4.0)
MCH: 27.9 pg (ref 26.0–34.0)
MCHC: 32.4 g/dL (ref 30.0–36.0)
MCV: 86.1 fL (ref 80.0–100.0)
MONO ABS: 0.4 10*3/uL (ref 0.1–1.0)
Monocytes Relative: 3 %
NEUTROS ABS: 15.1 10*3/uL — AB (ref 1.7–7.7)
Neutrophils Relative %: 89 %
PLATELETS: 447 10*3/uL — AB (ref 150–400)
RBC: 5.84 MIL/uL — ABNORMAL HIGH (ref 3.87–5.11)
RDW: 14.3 % (ref 11.5–15.5)
WBC: 16.9 10*3/uL — ABNORMAL HIGH (ref 4.0–10.5)
nRBC: 0 % (ref 0.0–0.2)

## 2018-10-19 LAB — COMPREHENSIVE METABOLIC PANEL
ALT: 25 U/L (ref 0–44)
AST: 28 U/L (ref 15–41)
Albumin: 4.6 g/dL (ref 3.5–5.0)
Alkaline Phosphatase: 98 U/L (ref 38–126)
Anion gap: 26 — ABNORMAL HIGH (ref 5–15)
BUN: 15 mg/dL (ref 6–20)
CALCIUM: 9.6 mg/dL (ref 8.9–10.3)
CHLORIDE: 95 mmol/L — AB (ref 98–111)
CO2: 13 mmol/L — ABNORMAL LOW (ref 22–32)
CREATININE: 1.08 mg/dL — AB (ref 0.44–1.00)
GFR, EST NON AFRICAN AMERICAN: 57 mL/min — AB (ref >60)
Glucose, Bld: 624 mg/dL (ref 70–99)
Potassium: 3.8 mmol/L (ref 3.5–5.1)
Sodium: 134 mmol/L — ABNORMAL LOW (ref 135–145)
TOTAL PROTEIN: 8.3 g/dL — AB (ref 6.5–8.1)
Total Bilirubin: 1.3 mg/dL — ABNORMAL HIGH (ref 0.3–1.2)

## 2018-10-19 LAB — BASIC METABOLIC PANEL
ANION GAP: 13 (ref 5–15)
ANION GAP: 11 (ref 5–15)
BUN: 12 mg/dL (ref 6–20)
BUN: 12 mg/dL (ref 6–20)
CALCIUM: 9.2 mg/dL (ref 8.9–10.3)
CO2: 20 mmol/L — ABNORMAL LOW (ref 22–32)
CO2: 22 mmol/L (ref 22–32)
CREATININE: 0.64 mg/dL (ref 0.44–1.00)
Calcium: 8.9 mg/dL (ref 8.9–10.3)
Chloride: 101 mmol/L (ref 98–111)
Chloride: 105 mmol/L (ref 98–111)
Creatinine, Ser: 0.83 mg/dL (ref 0.44–1.00)
GFR calc non Af Amer: >60 mL/min (ref >60)
GLUCOSE: 365 mg/dL — AB (ref 70–99)
Glucose, Bld: 157 mg/dL — ABNORMAL HIGH (ref 70–99)
POTASSIUM: 3.2 mmol/L — AB (ref 3.5–5.1)
Potassium: 3.2 mmol/L — ABNORMAL LOW (ref 3.5–5.1)
Sodium: 134 mmol/L — ABNORMAL LOW (ref 135–145)
Sodium: 138 mmol/L (ref 135–145)

## 2018-10-19 LAB — I-STAT BETA HCG BLOOD, ED (MC, WL, AP ONLY): I-stat hCG, quantitative: <5 m[IU]/mL (ref <5)

## 2018-10-19 LAB — GLUCOSE, CAPILLARY
GLUCOSE-CAPILLARY: 127 mg/dL — AB (ref 70–99)
GLUCOSE-CAPILLARY: 165 mg/dL — AB (ref 70–99)
GLUCOSE-CAPILLARY: 304 mg/dL — AB (ref 70–99)
Glucose-Capillary: 280 mg/dL — ABNORMAL HIGH (ref 70–99)
Glucose-Capillary: 218 mg/dL — ABNORMAL HIGH (ref 70–99)
Glucose-Capillary: 169 mg/dL — ABNORMAL HIGH (ref 70–99)
Glucose-Capillary: 142 mg/dL — ABNORMAL HIGH (ref 70–99)
Glucose-Capillary: 126 mg/dL — ABNORMAL HIGH (ref 70–99)
Glucose-Capillary: 121 mg/dL — ABNORMAL HIGH (ref 70–99)

## 2018-10-19 LAB — HEMOGLOBIN A1C
Hgb A1c MFr Bld: 14.5 % — ABNORMAL HIGH (ref 4.8–5.6)
MEAN PLASMA GLUCOSE: 369.45 mg/dL

## 2018-10-19 LAB — CBG MONITORING, ED
GLUCOSE-CAPILLARY: 579 mg/dL — AB (ref 70–99)
GLUCOSE-CAPILLARY: 435 mg/dL — AB (ref 70–99)
GLUCOSE-CAPILLARY: 322 mg/dL — AB (ref 70–99)
Glucose-Capillary: 426 mg/dL — ABNORMAL HIGH (ref 70–99)
Glucose-Capillary: 398 mg/dL — ABNORMAL HIGH (ref 70–99)

## 2018-10-19 LAB — I-STAT VENOUS BLOOD GAS, ED
Acid-base deficit: 7 mmol/L — ABNORMAL HIGH (ref 0.0–2.0)
BICARBONATE: 17.4 mmol/L — AB (ref 20.0–28.0)
O2 SAT: 99 %
PCO2 VEN: 32.2 mmHg — AB (ref 44.0–60.0)
PO2 VEN: 121 mmHg — AB (ref 32.0–45.0)
TCO2: 18 mmol/L — ABNORMAL LOW (ref 22–32)
pH, Ven: 7.341 (ref 7.250–7.430)

## 2018-10-19 LAB — LIPASE, BLOOD: Lipase: 24 U/L (ref 11–51)

## 2018-10-19 LAB — BETA-HYDROXYBUTYRIC ACID: BETA-HYDROXYBUTYRIC ACID: 1.14 mmol/L — AB (ref 0.05–0.27)

## 2018-10-19 MED ORDER — PANTOPRAZOLE SODIUM 40 MG PO TBEC
40.0000 mg | DELAYED_RELEASE_TABLET | Freq: Every day | ORAL | Status: DC
  Administered 2018-10-20: 40 mg via ORAL
  Filled 2018-10-19: qty 1

## 2018-10-19 MED ORDER — LACTATED RINGERS IV BOLUS
1000.0000 mL | Freq: Once | INTRAVENOUS | Status: AC
  Administered 2018-10-19: 1000 mL via INTRAVENOUS

## 2018-10-19 MED ORDER — PROMETHAZINE HCL 25 MG/ML IJ SOLN
25.0000 mg | Freq: Once | INTRAMUSCULAR | Status: AC
  Administered 2018-10-19: 25 mg via INTRAVENOUS
  Filled 2018-10-19: qty 1

## 2018-10-19 MED ORDER — NITROGLYCERIN 0.2 MG/HR TD PT24
0.2000 mg | MEDICATED_PATCH | Freq: Every day | TRANSDERMAL | Status: DC
  Administered 2018-10-20: 0.2 mg via TRANSDERMAL
  Filled 2018-10-19: qty 1

## 2018-10-19 MED ORDER — ENOXAPARIN SODIUM 40 MG/0.4ML ~~LOC~~ SOLN
40.0000 mg | SUBCUTANEOUS | Status: DC
  Administered 2018-10-19: 40 mg via SUBCUTANEOUS
  Filled 2018-10-19: qty 0.4

## 2018-10-19 MED ORDER — PROMETHAZINE HCL 25 MG PO TABS: 25.0000 mg | ORAL_TABLET | Freq: Three times a day (TID) | ORAL | Status: DC | PRN

## 2018-10-19 MED ORDER — LEVOTHYROXINE SODIUM 75 MCG PO TABS
150.0000 ug | ORAL_TABLET | Freq: Every day | ORAL | Status: DC
  Administered 2018-10-20: 150 ug via ORAL
  Filled 2018-10-19: qty 2

## 2018-10-19 MED ORDER — SODIUM CHLORIDE 0.9 % IV SOLN
INTRAVENOUS | Status: AC
  Administered 2018-10-19: 17:00:00 via INTRAVENOUS

## 2018-10-19 MED ORDER — DEXTROSE-NACL 5-0.45 % IV SOLN
INTRAVENOUS | Status: DC
  Administered 2018-10-19 – 2018-10-20 (×2): via INTRAVENOUS

## 2018-10-19 MED ORDER — ATORVASTATIN CALCIUM 80 MG PO TABS: 80.0000 mg | ORAL_TABLET | Freq: Every day | ORAL | Status: DC

## 2018-10-19 MED ORDER — INSULIN REGULAR(HUMAN) IN NACL 100-0.9 UT/100ML-% IV SOLN
INTRAVENOUS | Status: DC
  Administered 2018-10-19: 3.7 [IU]/h via INTRAVENOUS
  Administered 2018-10-19: 6.8 [IU]/h via INTRAVENOUS
  Filled 2018-10-19: qty 100

## 2018-10-19 MED ORDER — METOPROLOL SUCCINATE ER 25 MG PO TB24
25.0000 mg | ORAL_TABLET | Freq: Every day | ORAL | Status: DC
  Administered 2018-10-20: 25 mg via ORAL
  Filled 2018-10-19: qty 1

## 2018-10-19 MED ORDER — ASPIRIN 81 MG PO CHEW
81.0000 mg | CHEWABLE_TABLET | Freq: Every day | ORAL | Status: DC
  Administered 2018-10-20: 81 mg via ORAL
  Filled 2018-10-19: qty 1

## 2018-10-19 MED ORDER — INSULIN REGULAR(HUMAN) IN NACL 100-0.9 UT/100ML-% IV SOLN
INTRAVENOUS | Status: DC
  Filled 2018-10-19 (×2): qty 100

## 2018-10-19 MED ORDER — SERTRALINE HCL 50 MG PO TABS
50.0000 mg | ORAL_TABLET | Freq: Every day | ORAL | Status: DC
  Administered 2018-10-20: 50 mg via ORAL
  Filled 2018-10-19: qty 1

## 2018-10-19 MED ORDER — DEXTROSE-NACL 5-0.45 % IV SOLN: INTRAVENOUS | Status: DC

## 2018-10-19 MED ORDER — EZETIMIBE 10 MG PO TABS: 10.0000 mg | ORAL_TABLET | Freq: Every day | ORAL | Status: DC

## 2018-10-19 MED ORDER — PROCHLORPERAZINE MALEATE 5 MG PO TABS: 5.0000 mg | ORAL_TABLET | Freq: Four times a day (QID) | ORAL | Status: DC | PRN

## 2018-10-19 MED ORDER — POTASSIUM CHLORIDE 10 MEQ/100ML IV SOLN
10.0000 meq | INTRAVENOUS | Status: AC
  Administered 2018-10-19 (×2): 10 meq via INTRAVENOUS
  Filled 2018-10-19 (×2): qty 100

## 2018-10-19 MED ORDER — SODIUM CHLORIDE 0.9 % IV SOLN: INTRAVENOUS | Status: DC

## 2018-10-19 MED ORDER — LOSARTAN POTASSIUM 25 MG PO TABS
25.0000 mg | ORAL_TABLET | Freq: Every day | ORAL | Status: DC
  Administered 2018-10-20: 25 mg via ORAL
  Filled 2018-10-19: qty 1

## 2018-10-19 NOTE — H&P (Addendum)
History and Physical    [name] [name] VVO:[id number] DOB: 10-09-1961 DOA: 10/19/2018  PCP: Jinny Sanders, MD  Patient coming from: Home  Chief Complaint: Vomiting  HPI: [name] [name] is a 57 y.o. female with medical history significant of DM2 with neuropathy and nephropathy, CKD, IBS, vertigo, hypothyroidism, HTN, HLD, GERD, CHF (30-35 % last checked in 09/2017), anxiety who presents with DKA.  She reports that she has difficulty controlling her BS.  She ate dinner last night and then forgot to take her evening insulin.  She woke up at 2am with severe nausea and vomiting. She notes further symptoms included cough for a few days, but no sputum production, no fever or chills.  She also notes polydipsia and polyuria.  She is requesting to drink fluids.  She has chronic chest pain and wears a nitro patch.  I reviewed her last Endocrine note from 10/30.  At that time, she was started on Tresiba 24 units, Humalog 8 units with a sliding scale.  [name] was able to correctly recite these dosages to me, she states she just neglected to take her insulin last night.  She has had no other changes, missed doses, or dietary changes.  She reports also having a history of gastroparesis and being on reglan.   ED Course: In the ED, she was found to be acidotic with a bicarb of 13, glucose of 624, AG of 26, bilirubin of 1.3, WBC of 16.9, Hgb of 16.3 and elevated platelets to 447.  PH was 7.341 with a CO2 of 32.  Urine showed glucosuria and ketones.  EKG showed sinus tachycardia.  CXR not yet done.    Review of Systems: As per HPI otherwise 10 point review of systems negative.    Past Medical History:  Diagnosis Date  . Anxiety   . B12 deficiency   . Chest pain 04/05/2017  . CHF (congestive heart failure) ([place])   . DKA (diabetic ketoacidoses) ([place]) 05/25/2018  . GERD (gastroesophageal reflux disease)   . Hyperlipidemia   . Hypertension    [name]  . Hypothyroidism   . IBS (irritable bowel syndrome)     . Intermittent vertigo   . 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 Leg cramping    "@ night" (09/18/2013)  . Migraine    "once q couple months" (09/18/2013)  . Osteoporosis   . Peripheral neuropathy   . Type II diabetes mellitus ([place])   . Umbilical hernia    unrepaired (09/18/2013)    Past Surgical History:  Procedure Laterality Date  . CESAREAN SECTION  1989  . LAPAROSCOPIC ASSISTED VAGINAL HYSTERECTOMY  09/23/2012   Procedure: LAPAROSCOPIC ASSISTED VAGINAL HYSTERECTOMY;  Surgeon: Linda Hedges, DO;  Location: [facility];  Service: Gynecology;  Laterality: N/A;  pull Dr Gregor Hams instrument  . LEFT HEART CATH AND CORONARY ANGIOGRAPHY N/A 02/11/2017   Procedure: Left Heart Cath and Coronary Angiography;  Surgeon: Lorretta Harp, MD;  Location: [facility];  Service: Cardiovascular;  Laterality: N/A;  . TUBAL LIGATION Bilateral 1992   Reviewed with patient.  Last marijuana use was 3 days ago.   reports that she quit smoking about 27 years ago. Her smoking use included cigarettes. She quit after 0.20 years of use. She has never used smokeless tobacco. She reports that she has current or past drug history. Drug: Marijuana. Frequency: 3.00 times per week. She reports that she does not drink alcohol.  Allergies  Allergen Reactions  .  Codeine Hives, Anxiety and Other (See Comments)   Reviewed with patient.  Family History  Problem Relation Age of Onset  . Diabetes Other   . Heart disease Other   . Heart failure Mother 29       Her heart skipped a beat and stopped - per pt  . Diabetes Maternal Grandmother   . Alzheimer's disease Maternal Grandmother   . Coronary artery disease Maternal Grandmother   . Heart attack Maternal Grandfather   . Heart failure Brother     Prior to Admission medications   Medication Sig Start Date End Date Taking? Authorizing Provider  aspirin 81 MG chewable tablet Chew 1 tablet (81 mg total) by mouth daily. 02/12/17  Yes Jani Gravel, MD  GLUCAGON EMERGENCY 1 MG injection Inject 1 mg as directed once. 10/08/18  Yes [provider]  insulin lispro (HUMALOG KWIKPEN) 100 UNIT/ML KiwkPen Inject into skin 3 times a day at meal time according to the following sliding scale: CBG 70-120 - 0 units / CBG 121-150 - 1 unit / CBG 151-200 - 2 units / CBG 201-250 - 3 units / CBG 251-300 - 5 units / CBG 301-350 - 7 units / CBG 351-400+ - 9 units  YOU MUST FOLLOW A STRICT DIABETIC DIET 07/01/18  Yes Bedsole, Amy E, MD  levothyroxine (SYNTHROID, LEVOTHROID) 150 MCG tablet TAKE 1 TABLET BY MOUTH EVERY DAY Patient taking differently: Take 150 mcg by mouth daily before breakfast.  06/03/18  Yes Bedsole, Amy E, MD  promethazine (PHENERGAN) 25 MG tablet Take 1 tablet (25 mg total) by mouth every 8 (eight) hours as needed for nausea or vomiting. 09/25/18  Yes Bedsole, Amy E, MD  TRESIBA FLEXTOUCH 100 UNIT/ML SOPN FlexTouch Pen Take 24 Units by mouth at bedtime. 10/08/18  Yes [provider]  atorvastatin (LIPITOR) 80 MG tablet Take 1 tablet (80 mg total) by mouth daily at 6 PM. 03/13/18   Bedsole, Amy E, MD  ezetimibe (ZETIA) 10 MG tablet Take 1 tablet (10 mg total) by mouth daily. 09/11/16   Bedsole, Amy E, MD  furosemide (LASIX) 20 MG tablet Take 1 tablet (20 mg total) by mouth daily. Hold for the next 2 days, then resume on 08/08/2018 08/05/18 11/03/18  Elgergawy, Silver Huguenin, MD  furosemide (LASIX) 20 MG tablet Take 1 tablet (20 mg total) by mouth as needed. 09/30/18   Minus Breeding, MD  insulin glargine (LANTUS) 100 UNIT/ML injection Inject 0.3 mLs (30 Units total) into the skin daily. 09/12/18   Ghimire, Henreitta Leber, MD  Insulin Pen Needle (PEN NEEDLES) 31G X 8 MM MISC Use to inject insulin 4 times a day.  Dx: E11.10 12/23/17   Jinny Sanders, MD  Lancets (ONETOUCH ULTRASOFT) lancets Use to check blood sugar daily as needed.  Dx: E11.10 12/23/17   Bedsole, Amy E, MD  losartan (COZAAR) 25 MG tablet TAKE 1 TABLET BY MOUTH EVERY  DAY Patient taking differently: Take 25 mg by mouth daily.  08/27/18   Barrett, Evelene Croon, PA-C  metoCLOPramide (REGLAN) 5 MG tablet TAKE 1 TABLET BY MOUTH 4 TIMES A DAY BEFORE MEALS AND AT BEDTIME Patient taking differently: Take 5 mg by mouth See admin instructions. TAKE 1 TABLET BY MOUTH 4 TIMES A DAY BEFORE MEALS AND AT BEDTIME 08/12/18   Bedsole, Amy E, MD  metoprolol succinate (TOPROL-XL) 25 MG 24 hr tablet TAKE 1 TABLET BY MOUTH EVERY DAY Patient taking differently: Take 25 mg by mouth daily.  10/06/18  Minus Breeding, MD  nitroGLYCERIN (NITRODUR - DOSED IN MG/24 HR) 0.2 mg/hr patch APPLY 1 PATCH TO SKIN EVERY DAY AS NEEDED Patient taking differently: Place 0.2 mg onto the skin daily.  08/27/18   Barrett, Evelene Croon, PA-C  ONETOUCH VERIO test strip USE TO CHECK BLOOD SUGAR DAILY AS NEEDED. DX: E11.10 09/21/18   Bedsole, Amy E, MD  pantoprazole (PROTONIX) 40 MG tablet Take 1 tablet (40 mg total) by mouth daily at 12 noon. 01/28/18   Bedsole, Amy E, MD  RELPAX 40 MG tablet ONE TABLET BY MOUTH AT ONSET OF HEADACHE. MAY REPEAT IN 2 HOURS IF HEADACHE PERSISTS OR RECURS. Patient not taking: One tablet by mouth at onset of headache. May repeat in 2 hours if headache persists or recurs. 10/17/16   Bedsole, Amy E, MD  sertraline (ZOLOFT) 50 MG tablet TAKE 1 TABLET BY MOUTH EVERY DAY Patient taking differently: Take 50 mg by mouth daily.  06/19/18   Jinny Sanders, MD    Physical Exam:  Constitutional: Lying in bed, comfortable, alert, appears older than stated age of 69 Vitals:   10/19/18 1000 10/19/18 1100 10/19/18 1403 10/19/18 1415  BP: (!) 132/98 132/83 (!) 162/101   Pulse: (!) 107 (!) 112 (!) 121 (!) 139  Resp: 16 17 12 19   Temp:      TempSrc:      SpO2: 98% 97% 98% 97%  Weight:      Height:       Eyes:  lids and conjunctivae normal ENMT: Mucous membranes are dry Neck: normal, supple Respiratory: CTAB, no wheezing or crackles Cardiovascular: Tachycardic, regular, no murmur Abdomen: +  TTP in the epigastrium and RUQ, otherwise non tender, non distended, +BS Musculoskeletal: No edema, cyanosis Skin: no rashes, lesions, ulcers on exposed skin Neurologic: CN grossly intact, strength intact, moving spontaneously in bed Psychiatric: Normal judgment and insight. Alert and oriented x 3. Normal mood.   Labs on Admission: I have personally reviewed following labs and imaging studies  CBC: Recent Labs  Lab 10/19/18 1002  WBC 16.9*  NEUTROABS 15.1*  HGB 16.3*  HCT 50.3*  MCV 86.1  PLT 824*   Basic Metabolic Panel: Recent Labs  Lab 10/19/18 1002  NA 134*  K 3.8  CL 95*  CO2 13*  GLUCOSE 624*  BUN 15  CREATININE 1.08*  CALCIUM 9.6   GFR: Estimated Creatinine Clearance: 47.6 mL/min (A) (by C-G formula based on SCr of 1.08 mg/dL (H)). Liver Function Tests: Recent Labs  Lab 10/19/18 1002  AST 28  ALT 25  ALKPHOS 98  BILITOT 1.3*  PROT 8.3*  ALBUMIN 4.6   Recent Labs  Lab 10/19/18 1002  LIPASE 24   No results for input(s): AMMONIA in the last 168 hours. Coagulation Profile: No results for input(s): INR, PROTIME in the last 168 hours. Cardiac Enzymes: No results for input(s): CKTOTAL, CKMB, CKMBINDEX, TROPONINI in the last 168 hours. BNP (last 3 results) No results for input(s): PROBNP in the last 8760 hours. HbA1C: No results for input(s): HGBA1C in the last 72 hours. CBG: Recent Labs  Lab 10/19/18 0950 10/19/18 1213 10/19/18 1303 10/19/18 1403  GLUCAP 579* 435* 426* 398*   Lipid Profile: No results for input(s): CHOL, HDL, LDLCALC, TRIG, CHOLHDL, LDLDIRECT in the last 72 hours. Thyroid Function Tests: No results for input(s): TSH, T4TOTAL, FREET4, T3FREE, THYROIDAB in the last 72 hours. Anemia Panel: No results for input(s): VITAMINB12, FOLATE, FERRITIN, TIBC, IRON, RETICCTPCT in the last 72 hours. Urine analysis:  Component Value Date/Time   COLORURINE STRAW (A) 10/19/2018 [place] 10/19/2018 0954   LABSPEC 1.026  10/19/2018 0954   PHURINE 5.0 10/19/2018 0954   GLUCOSEU >=500 (A) 10/19/2018 0954   HGBUR SMALL (A) 10/19/2018 0954   BILIRUBINUR NEGATIVE 10/19/2018 0954   BILIRUBINUR negative 09/11/2016 1038   KETONESUR 80 (A) 10/19/2018 0954   PROTEINUR 100 (A) 10/19/2018 0954   UROBILINOGEN 0.2 09/11/2016 1038   UROBILINOGEN 0.2 10/28/2013 1723   NITRITE NEGATIVE 10/19/2018 0954   LEUKOCYTESUR NEGATIVE 10/19/2018 0954    Radiological Exams on Admission: No results found.  EKG: Independently reviewed. Tachycardia, sinus  Assessment/Plan DKA (diabetic ketoacidoses) - Placed on the glucose stabilizer with IVF, 1 hour glucose and transition to D5 1/2 NS at the appropriate times.  - Given her intermediate K value, she will also have 2 10 meq runs of potassium - As she has only a mild acidosis, no need for bicarb - She was already improving when I saw her and asking for fluids - Repeat BMET q 4 hours - Transition to long acting insulin when gap closes, with 1-2 hour coverage with IV insulin - Check beta hydroxybutyrate - Check CXR given cough to evaluate for pneumonia as trigger of worsening glucose control    Poorly controlled type 2 diabetes mellitus with peripheral neuropathy  - Reviewed last endocrinology note, she has very uncontrolled and brittle diabetes.  She is able to recite her medications to me, but continues to miss doses (missed only one dose and developed DKA, I am curious if she has missed others) - Will start back on long acting insulin when gap closes  Gastroparesis due to DM  - continue reglan - Trial of compazine for nausea  Chronic systolic CHF (congestive heart failure)  - Monitor oxygenation closely, she has a history of EF of 30-35% and will be receiving quite a lot of fluids for DKA - Lasix held currently, she reports on PRN use at home - Oxygen to keep saturation > 92%  Hypothyroidism - Continue synthroid    Hyperlipidemia - Continue atorvastatin and zetia      Generalized anxiety disorder - Continue sertraline    GERD - Continue protonix    Essential hypertension - BP initially low, trending higher with fluids - Restart losartan and metoprolol on admission as she is improving    Kidney disease, chronic, stage 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) - Renal function relatively normal, but doubled compared to baseline - Fluids as noted above for glucose stabilizer - Recheck BMET - Restart losartan    Chronic chest pain - Continue nitro patch as needed     DVT prophylaxis: Lovenox Code Status: Full Disposition Plan: Admit for glucose stabilization, may be 1-2 day admission depending on how she does.  Consults called: None Admission status: SDU, inpatient   Gilles Chiquito MD [facility] Pager [phone]  If 7PM-7AM, please contact night-coverage [email or web address] Password [email or web address]  10/19/2018, 2:24 PM

## 2018-10-19 NOTE — ED Triage Notes (Signed)
PER [facility]- Pt picked up from home, reports she missed bedtime dose of insulin last night. C/o nausea.  20g IV and 4mg  Zofran given PTA.

## 2018-10-19 NOTE — ED Provider Notes (Signed)
[facility] EMERGENCY DEPARTMENT Provider Note   CSN: [id number] Arrival date & time: 10/19/18  0944     History   Chief Complaint Chief Complaint  Patient presents with  . Hyperglycemia    nausea    HPI [name] [name] is a 57 y.o. female.  The history is provided by the patient and medical records. No language interpreter was used.  Hyperglycemia  Associated symptoms: nausea and vomiting   Associated symptoms: no abdominal pain     [name] [name] is a 57 y.o. female  with a PMH of DM who presents to the Emergency Department complaining of nausea, vomiting which began upon awakening this morning.  Patient states that she typically takes 24 units of Tresiba at bedtime, however she fell asleep last night without taking her medication.  When she woke up, she states that she was nauseous and felt like her sugar was really high.  She checked her sugar and it was in the high 300s.  Her blood sugars typically run in the low 200s.  She then called EMS.  EMS gave 4 mg of Zofran for nausea with no improvement.  She reports having over 5 episodes of emesis today and did vomit once after Zofran was given.  Denies any recent illness or steroid use.  No fever, cough, congestion, chest pain, abdominal pain, diarrhea or constipation.  No urinary symptoms.  Past Medical History:  Diagnosis Date  . Anxiety   . B12 deficiency   . Chest pain 04/05/2017  . CHF (congestive heart failure) ([place])   . DKA (diabetic ketoacidoses) ([place]) 05/25/2018  . GERD (gastroesophageal reflux disease)   . Hyperlipidemia   . Hypertension    [name]  . Hypothyroidism   . IBS (irritable bowel syndrome)   . Intermittent vertigo   . 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 Leg cramping    "@ night" (09/18/2013)  . Migraine    "once q couple months" (09/18/2013)  . Osteoporosis   . Peripheral neuropathy   . Type II diabetes mellitus ([place])   . Umbilical hernia    unrepaired (09/18/2013)    Patient Active Problem List   Diagnosis Date Noted  . DKA (diabetic ketoacidoses) ([place]) 09/23/2018  . Hyperglycemia 08/04/2018  . Tachycardia 08/04/2018  . MDD (major depressive disorder), single episode, mild ([place]) 07/01/2018  . ARF (acute renal failure) ([place]) 05/26/2018  . Colitis 03/07/2018  . Chronic chest pain   . Chronic systolic CHF (congestive heart failure) ([place]) 01/14/2018  . Noncompliance 01/14/2018  . Migraines 12/11/2017  . Chest discomfort 09/18/2017  . DKA, type 2 ([place]) 09/17/2017  . Phrygian cap 06/20/2017  . Leukocytosis   . Lower abdominal pain   . Colitis presumed infectious   . Nonischemic cardiomyopathy ([place]) 03/22/2017  . Hypokalemia 02/15/2017  . NSTEMI (non-ST elevated myocardial infarction) ([place]) 02/11/2017  . H. pylori infection 02/09/2017  . Hypomagnesemia 02/09/2017  . Gastroparesis due to DM ([place]) 01/22/2017  . AKI (acute kidney injury) ([place]) 07/24/2016  . Poorly controlled type 2 diabetes mellitus with peripheral neuropathy ([place]) 07/17/2016  . Atrial fibrillation ([place]) 02/01/2015  . 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 Hepatic lesion 09/18/2013  . Vitamin D deficiency 05/20/2012  . Essential hypertension 02/01/2011  . Osteoporosis 02/01/2011  . B12 deficiency 04/04/2010  . Peripheral neuropathy ([place]) 05/10/2009  . GERD 04/08/2009  . Generalized anxiety disorder 06/22/2008  . Hypothyroidism 04/29/2008  . Hyperlipidemia 04/29/2008  .  Irritable bowel syndrome 04/29/2008    Past Surgical History:  Procedure Laterality Date  . CESAREAN SECTION  1989  . LAPAROSCOPIC ASSISTED VAGINAL HYSTERECTOMY  09/23/2012   Procedure: LAPAROSCOPIC ASSISTED VAGINAL HYSTERECTOMY;  Surgeon: Linda Hedges, DO;  Location: [facility];  Service: Gynecology;  Laterality: N/A;  pull Dr Gregor Hams instrument  . LEFT HEART CATH AND CORONARY ANGIOGRAPHY N/A 02/11/2017   Procedure: Left Heart Cath and Coronary Angiography;   Surgeon: Lorretta Harp, MD;  Location: [facility];  Service: Cardiovascular;  Laterality: N/A;  . TUBAL LIGATION Bilateral 1992     OB History    Gravida  2   Para  2   Term      Preterm      AB      Living        SAB      TAB      Ectopic      Multiple      Live Births               Home Medications    Prior to Admission medications   Medication Sig Start Date End Date Taking? Authorizing Provider  aspirin 81 MG chewable tablet Chew 1 tablet (81 mg total) by mouth daily. 02/12/17  Yes Jani Gravel, MD  GLUCAGON EMERGENCY 1 MG injection Inject 1 mg as directed once. 10/08/18  Yes [provider]  insulin lispro (HUMALOG KWIKPEN) 100 UNIT/ML KiwkPen Inject into skin 3 times a day at meal time according to the following sliding scale: CBG 70-120 - 0 units / CBG 121-150 - 1 unit / CBG 151-200 - 2 units / CBG 201-250 - 3 units / CBG 251-300 - 5 units / CBG 301-350 - 7 units / CBG 351-400+ - 9 units  YOU MUST FOLLOW A STRICT DIABETIC DIET 07/01/18  Yes Bedsole, Amy E, MD  levothyroxine (SYNTHROID, LEVOTHROID) 150 MCG tablet TAKE 1 TABLET BY MOUTH EVERY DAY Patient taking differently: Take 150 mcg by mouth daily before breakfast.  06/03/18  Yes Bedsole, Amy E, MD  promethazine (PHENERGAN) 25 MG tablet Take 1 tablet (25 mg total) by mouth every 8 (eight) hours as needed for nausea or vomiting. 09/25/18  Yes Bedsole, Amy E, MD  TRESIBA FLEXTOUCH 100 UNIT/ML SOPN FlexTouch Pen Take 24 Units by mouth at bedtime. 10/08/18  Yes [provider]  atorvastatin (LIPITOR) 80 MG tablet Take 1 tablet (80 mg total) by mouth daily at 6 PM. 03/13/18   Bedsole, Amy E, MD  ezetimibe (ZETIA) 10 MG tablet Take 1 tablet (10 mg total) by mouth daily. 09/11/16   Bedsole, Amy E, MD  furosemide (LASIX) 20 MG tablet Take 1 tablet (20 mg total) by mouth daily. Hold for the next 2 days, then resume on 08/08/2018 08/05/18 11/03/18  Elgergawy, Silver Huguenin, MD  furosemide (LASIX)  20 MG tablet Take 1 tablet (20 mg total) by mouth as needed. 09/30/18   Minus Breeding, MD  insulin glargine (LANTUS) 100 UNIT/ML injection Inject 0.3 mLs (30 Units total) into the skin daily. 09/12/18   Ghimire, Henreitta Leber, MD  Insulin Pen Needle (PEN NEEDLES) 31G X 8 MM MISC Use to inject insulin 4 times a day.  Dx: E11.10 12/23/17   Jinny Sanders, MD  Lancets (ONETOUCH ULTRASOFT) lancets Use to check blood sugar daily as needed.  Dx: E11.10 12/23/17   Jinny Sanders, MD  losartan (COZAAR) 25 MG tablet TAKE 1 TABLET BY MOUTH EVERY DAY Patient  taking differently: Take 25 mg by mouth daily.  08/27/18   Barrett, Evelene Croon, PA-C  metoCLOPramide (REGLAN) 5 MG tablet TAKE 1 TABLET BY MOUTH 4 TIMES A DAY BEFORE MEALS AND AT BEDTIME Patient taking differently: Take 5 mg by mouth See admin instructions. TAKE 1 TABLET BY MOUTH 4 TIMES A DAY BEFORE MEALS AND AT BEDTIME 08/12/18   Bedsole, Amy E, MD  metoprolol succinate (TOPROL-XL) 25 MG 24 hr tablet TAKE 1 TABLET BY MOUTH EVERY DAY Patient taking differently: Take 25 mg by mouth daily.  10/06/18   Minus Breeding, MD  nitroGLYCERIN (NITRODUR - DOSED IN MG/24 HR) 0.2 mg/hr patch APPLY 1 PATCH TO SKIN EVERY DAY AS NEEDED Patient taking differently: Place 0.2 mg onto the skin daily.  08/27/18   Barrett, Evelene Croon, PA-C  ONETOUCH VERIO test strip USE TO CHECK BLOOD SUGAR DAILY AS NEEDED. DX: E11.10 09/21/18   Bedsole, Amy E, MD  pantoprazole (PROTONIX) 40 MG tablet Take 1 tablet (40 mg total) by mouth daily at 12 noon. 01/28/18   Bedsole, Amy E, MD  RELPAX 40 MG tablet ONE TABLET BY MOUTH AT ONSET OF HEADACHE. MAY REPEAT IN 2 HOURS IF HEADACHE PERSISTS OR RECURS. Patient not taking: One tablet by mouth at onset of headache. May repeat in 2 hours if headache persists or recurs. 10/17/16   Bedsole, Amy E, MD  sertraline (ZOLOFT) 50 MG tablet TAKE 1 TABLET BY MOUTH EVERY DAY Patient taking differently: Take 50 mg by mouth daily.  06/19/18   Jinny Sanders, MD    Family  History Family History  Problem Relation Age of Onset  . Diabetes Other   . Heart disease Other   . Heart failure Mother 53       Her heart skipped a beat and stopped - per pt  . Diabetes Maternal Grandmother   . Alzheimer's disease Maternal Grandmother   . Coronary artery disease Maternal Grandmother   . Heart attack Maternal Grandfather   . Heart failure Brother     Social History Social History   Tobacco Use  . Smoking status: Former Smoker    Years: 0.20    Types: Cigarettes    Last attempt to quit: 12/10/1990    Years since quitting: 27.8  . Smokeless tobacco: Never Used  Substance Use Topics  . Alcohol use: No    Alcohol/week: 0.0 standard drinks  . Drug use: Yes    Frequency: 3.0 times per week    Types: Marijuana    Comment: last use a couple of days ago     Allergies   Codeine   Review of Systems Review of Systems  Gastrointestinal: Positive for nausea and vomiting. Negative for abdominal pain.  All other systems reviewed and are negative.    Physical Exam Updated Vital Signs BP 132/83   Pulse (!) 112   Temp 98.2 F (36.8 C) (Oral)   Resp 17   Ht 4\' 11"  (1.499 m)   Wt 63 kg   LMP 08/11/2012   SpO2 97%   BMI 28.07 kg/m   Physical Exam  Constitutional: She is oriented to person, place, and time. She appears well-developed and well-nourished. No distress.  HENT:  Head: Normocephalic and atraumatic.  Cardiovascular: Normal rate, regular rhythm and normal heart sounds.  No murmur heard. Pulmonary/Chest: Effort normal and breath sounds normal. No respiratory distress.  Abdominal: Soft. She exhibits no distension.  No abdominal tenderness.  Musculoskeletal: Normal range of motion.  Neurological: She is  alert and oriented to person, place, and time.  Skin: Skin is warm and dry.  Nursing note and vitals reviewed.    ED Treatments / Results  Labs (all labs ordered are listed, but only abnormal results are displayed) Labs Reviewed  CBC WITH  DIFFERENTIAL/PLATELET - Abnormal; Notable for the following components:      Result Value   WBC 16.9 (*)    RBC 5.84 (*)    Hemoglobin 16.3 (*)    HCT 50.3 (*)    Platelets 447 (*)    Neutro Abs 15.1 (*)    Abs Immature Granulocytes 0.21 (*)    All other components within normal limits  COMPREHENSIVE METABOLIC PANEL - Abnormal; Notable for the following components:   Sodium 134 (*)    Chloride 95 (*)    CO2 13 (*)    Glucose, Bld 624 (*)    Creatinine, Ser 1.08 (*)    Total Protein 8.3 (*)    Total Bilirubin 1.3 (*)    GFR calc non Af Amer 57 (*)    Anion gap 26 (*)    All other components within normal limits  CBG MONITORING, ED - Abnormal; Notable for the following components:   Glucose-Capillary 579 (*)    All other components within normal limits  I-STAT VENOUS BLOOD GAS, ED - Abnormal; Notable for the following components:   pCO2, Ven 32.2 (*)    pO2, Ven 121.0 (*)    Bicarbonate 17.4 (*)    TCO2 18 (*)    Acid-base deficit 7.0 (*)    All other components within normal limits  LIPASE, BLOOD  URINALYSIS, ROUTINE W REFLEX MICROSCOPIC  I-STAT BETA HCG BLOOD, ED ([facility], [facility], [facility] ONLY)    EKG EKG Interpretation  Date/Time:  Sunday October 19 2018 09:49:47 EST Ventricular Rate:  114 PR Interval:    QRS Duration: 94 QT Interval:  353 QTC Calculation: 487 R Axis:   100 Text Interpretation:  Sinus tachycardia Right axis deviation Abnormal R-wave progression, late transition Minimal ST depression, inferior leads Borderline prolonged QT interval Baseline wander in lead(s) I II aVR Confirmed by Veryl Speak [phone]) on 10/19/2018 9:55:41 AM   Radiology No results found.  Procedures Procedures (including critical care time)  CRITICAL CARE Performed by: Ozella Almond Ward   Total critical care time: 35 minutes  Critical care time was exclusive of separately billable procedures and treating other patients.  Critical care was necessary to treat or prevent imminent or  life-threatening deterioration.  Critical care was time spent personally by me on the following activities: development of treatment plan with patient and/or surrogate as well as nursing, discussions with consultants, evaluation of patient's response to treatment, examination of patient, obtaining history from patient or surrogate, ordering and performing treatments and interventions, ordering and review of laboratory studies, ordering and review of radiographic studies, pulse oximetry and re-evaluation of patient's condition.   Medications Ordered in ED Medications  dextrose 5 %-0.45 % sodium chloride infusion (has no administration in time range)  insulin regular, human (MYXREDLIN) 100 units/ 100 mL infusion (has no administration in time range)  lactated ringers bolus 1,000 mL (1,000 mLs Intravenous New Bag/Given 10/19/18 1035)    Followed by  lactated ringers bolus 1,000 mL (0 mLs Intravenous Stopped 10/19/18 1123)  promethazine (PHENERGAN) injection 25 mg (25 mg Intravenous Given 10/19/18 1011)     Initial Impression / Assessment and Plan / ED Course  I have reviewed the triage vital signs and the nursing  notes.  Pertinent labs & imaging results that were available during my care of the patient were reviewed by me and considered in my medical decision making (see chart for details).    ALINDA EGOLF is a 57 y.o. female who presents to ED for nausea and vomiting which began this morning.  She endorses skipping her bedtime insulin dose last night.  Denies any illness or precipitating infection.  Denies any other missed doses of her insulin.  Labs reviewed and concerning for DKA with anion gap of 26 and glucose of 24.  CO2 of 13.  VBG obtained with pH of 7.3.  Started on glucose stabilizer and admitted to hospitalist for further evaluation and management.   Final Clinical Impressions(s) / ED Diagnoses   Final diagnoses:  Diabetic ketoacidosis without coma associated with type 2  diabetes mellitus [facility])    ED Discharge Orders    None       Ward, Ozella Almond, PA-C 10/19/18 1143    Veryl Speak, MD 10/20/18 [phone]

## 2018-10-20 DIAGNOSIS — E1110 Type 2 diabetes mellitus with ketoacidosis without coma: Secondary | ICD-10-CM | POA: Diagnosis not present

## 2018-10-20 LAB — BASIC METABOLIC PANEL
Anion gap: 7 (ref 5–15)
Anion gap: 8 (ref 5–15)
Anion gap: 8 (ref 5–15)
BUN: 10 mg/dL (ref 6–20)
BUN: 6 mg/dL (ref 6–20)
BUN: 9 mg/dL (ref 6–20)
CHLORIDE: 106 mmol/L (ref 98–111)
CHLORIDE: 105 mmol/L (ref 98–111)
CHLORIDE: 107 mmol/L (ref 98–111)
CO2: 21 mmol/L — AB (ref 22–32)
CO2: 21 mmol/L — ABNORMAL LOW (ref 22–32)
CO2: 22 mmol/L (ref 22–32)
CREATININE: 0.53 mg/dL (ref 0.44–1.00)
Calcium: 8.2 mg/dL — ABNORMAL LOW (ref 8.9–10.3)
Calcium: 8.3 mg/dL — ABNORMAL LOW (ref 8.9–10.3)
Calcium: 8.6 mg/dL — ABNORMAL LOW (ref 8.9–10.3)
Creatinine, Ser: 0.53 mg/dL (ref 0.44–1.00)
Creatinine, Ser: 0.59 mg/dL (ref 0.44–1.00)
GFR calc Af Amer: >60 mL/min (ref >60)
GFR calc Af Amer: >60 mL/min (ref >60)
GFR calc Af Amer: >60 mL/min (ref >60)
GFR calc non Af Amer: >60 mL/min (ref >60)
GFR calc non Af Amer: >60 mL/min (ref >60)
GFR calc non Af Amer: >60 mL/min (ref >60)
GLUCOSE: 138 mg/dL — AB (ref 70–99)
GLUCOSE: 174 mg/dL — AB (ref 70–99)
GLUCOSE: 182 mg/dL — AB (ref 70–99)
POTASSIUM: 2.9 mmol/L — AB (ref 3.5–5.1)
POTASSIUM: 3.9 mmol/L (ref 3.5–5.1)
Potassium: 4.2 mmol/L (ref 3.5–5.1)
Sodium: 134 mmol/L — ABNORMAL LOW (ref 135–145)
Sodium: 135 mmol/L (ref 135–145)
Sodium: 136 mmol/L (ref 135–145)

## 2018-10-20 LAB — GLUCOSE, CAPILLARY
GLUCOSE-CAPILLARY: 171 mg/dL — AB (ref 70–99)
GLUCOSE-CAPILLARY: 164 mg/dL — AB (ref 70–99)
GLUCOSE-CAPILLARY: 156 mg/dL — AB (ref 70–99)
GLUCOSE-CAPILLARY: 156 mg/dL — AB (ref 70–99)
GLUCOSE-CAPILLARY: 122 mg/dL — AB (ref 70–99)
Glucose-Capillary: 176 mg/dL — ABNORMAL HIGH (ref 70–99)
Glucose-Capillary: 162 mg/dL — ABNORMAL HIGH (ref 70–99)
Glucose-Capillary: 179 mg/dL — ABNORMAL HIGH (ref 70–99)
Glucose-Capillary: 176 mg/dL — ABNORMAL HIGH (ref 70–99)
Glucose-Capillary: 99 mg/dL (ref 70–99)

## 2018-10-20 LAB — MAGNESIUM: Magnesium: 1.8 mg/dL (ref 1.7–2.4)

## 2018-10-20 MED ORDER — METOCLOPRAMIDE HCL 5 MG PO TABS
5.0000 mg | ORAL_TABLET | Freq: Three times a day (TID) | ORAL | Status: DC
  Administered 2018-10-20 (×3): 5 mg via ORAL
  Filled 2018-10-20 (×3): qty 1

## 2018-10-20 MED ORDER — INSULIN ASPART 100 UNIT/ML ~~LOC~~ SOLN: 0.0000 [IU] | Freq: Every day | SUBCUTANEOUS | Status: DC

## 2018-10-20 MED ORDER — INSULIN ASPART 100 UNIT/ML ~~LOC~~ SOLN
3.0000 [IU] | Freq: Three times a day (TID) | SUBCUTANEOUS | Status: DC
  Administered 2018-10-20 (×2): 3 [IU] via SUBCUTANEOUS

## 2018-10-20 MED ORDER — INSULIN ASPART 100 UNIT/ML ~~LOC~~ SOLN
0.0000 [IU] | Freq: Three times a day (TID) | SUBCUTANEOUS | Status: DC
  Administered 2018-10-20: 2 [IU] via SUBCUTANEOUS
  Administered 2018-10-20: 3 [IU] via SUBCUTANEOUS

## 2018-10-20 MED ORDER — INSULIN GLARGINE 100 UNIT/ML ~~LOC~~ SOLN
20.0000 [IU] | SUBCUTANEOUS | Status: DC
  Administered 2018-10-20: 20 [IU] via SUBCUTANEOUS
  Filled 2018-10-20 (×2): qty 0.2

## 2018-10-20 MED ORDER — SODIUM CHLORIDE 0.9 % IV SOLN
INTRAVENOUS | Status: DC
  Administered 2018-10-20: 08:00:00 via INTRAVENOUS

## 2018-10-20 MED ORDER — POTASSIUM CHLORIDE CRYS ER 20 MEQ PO TBCR
40.0000 meq | EXTENDED_RELEASE_TABLET | ORAL | Status: AC
  Administered 2018-10-20 (×2): 40 meq via ORAL
  Filled 2018-10-20 (×2): qty 2

## 2018-10-20 MED ORDER — TRESIBA FLEXTOUCH 100 UNIT/ML ~~LOC~~ SOPN: 24.0000 [IU] | PEN_INJECTOR | Freq: Every day | SUBCUTANEOUS | 5 refills | Status: DC

## 2018-10-20 MED ORDER — POTASSIUM CHLORIDE CRYS ER 20 MEQ PO TBCR
40.0000 meq | EXTENDED_RELEASE_TABLET | Freq: Once | ORAL | Status: AC
  Administered 2018-10-20: 40 meq via ORAL
  Filled 2018-10-20: qty 2

## 2018-10-20 NOTE — Discharge Instructions (Signed)
On 10/20/18 take tresiba at lunch time, on 10/21/18 take at dinner time. On 10/22/18 resume at bedtime regimen.

## 2018-10-20 NOTE — Progress Notes (Addendum)
Inpatient Diabetes Program Recommendations  AACE/ADA: New Consensus Statement on Inpatient Glycemic Control (2015)  Target Ranges:  Prepandial:   less than 140 mg/dL      Peak postprandial:   less than 180 mg/dL (1-2 hours)      Critically ill patients:  140 - 180 mg/dL   Results for [name] [name], [name] [name] (MRN [id number]) as of 10/20/2018 11:30  Ref. Range 10/19/2018 10:02  Sodium Latest Ref Range: 135 - 145 mmol/L 134 (L)  Potassium Latest Ref Range: 3.5 - 5.1 mmol/L 3.8  Chloride Latest Ref Range: 98 - 111 mmol/L 95 (L)  CO2 Latest Ref Range: 22 - 32 mmol/L 13 (L)  Glucose Latest Ref Range: 70 - 99 mg/dL 624 (HH)  BUN Latest Ref Range: 6 - 20 mg/dL 15  Creatinine Latest Ref Range: 0.44 - 1.00 mg/dL 1.08 (H)  Calcium Latest Ref Range: 8.9 - 10.3 mg/dL 9.6  Anion gap Latest Ref Range: 5 - 15  26 (H)   Results for [name] [name], [name] [name] (MRN [id number]) as of 10/20/2018 11:30  Ref. Range 12/11/2017 14:16 02/02/2018 12:02 05/28/2018 05:35 08/04/2018 07:05 09/22/2018 12:13 10/19/2018 15:55  Hemoglobin A1C Latest Ref Range: 4.8 - 5.6 % 11.1 (H) 12.7 (H) 16.7 (H) 14.5 (H) >15.5 (H) 14.5 (H)   Admit with: DKA (ate dinner the night prior to admission and forgot to take insulin)  History: DM, CKD, CHF, Gastroparesis  Home DM Meds: Humalog 0-9 units TID per SSI       Tresiba 24 units QHS  Current Orders: Lantus 20 units Daily      Novolog Moderate Correction Scale/ SSI (0-15 units) TID AC + HS      Novolog 3 units TID with meals     This patient is well known to the Inpatient Diabetes Program.  This is pt's 9th admission since January of this year for DKA.  Patient was counseled about the importance of good glucose control in January, February, and March by the DM Coordinator.  Recently seen by new Endocrinologist (Dr. Hartford Poli with [facility] in [place], [place]) on 10/08/2018.  This was patient's initial consult with the new ENDO team.  Was instructed by Dr. Hartford Poli to take the following at home for glucose  control: Tresiba 24 units QHS Humalog 8 units TID with meals Humalog 1 unit for every 50 mg/dl above Target CBG of 150 mg/dl Was instructed to check CBG 6-8 times per day and was given Rx for [facility] continuous glucose monitor.  Transitioned off the IV Insulin drip this AM to Lantus and Novolog.  20 units Lantus given at 8:42am.   Addendum 2:30pm- Met with pt again today to discuss current A1c of 14.5% and reason for hospitalization.  Explained treatments administered for DKA.  Pt stated she only missed 1 dose of Insulin at home, however, this is hard to believe since her A1c levels all year have been extremely high.  Re-Explained what an A1c is and what it measures.  Reminded patient that her goal A1c is 7% or less per ADA standards to prevent both acute and long-term complications.  Explained to patient the extreme importance of good glucose control at home.  Encouraged patient to check her CBGs at least 6-8 times per day as instructed by her ENDO at her last ENDO visit (see [place]) and to record all CBGs in a logbook for her Endocrinologist to review.  Patient just started using [employer] continuous glucose monitor about 2 weeks ago.  This may help  her better manager her CBGs, however, she HAS to remember to take her insulin!  Has follow up appt with her ENDO on the day before Thanksgiving this month.  Was able to correctly identify and tell me how much Insulin she should be taking at home.     --Will follow patient during hospitalization--  Wyn Quaker RN, MSN, CDE Diabetes Coordinator Inpatient Glycemic Control Team Team Pager: [phone] (8a-5p)

## 2018-10-21 ENCOUNTER — Telehealth: Payer: Self-pay

## 2018-10-21 ENCOUNTER — Other Ambulatory Visit: Payer: Self-pay | Admitting: Family Medicine

## 2018-10-21 NOTE — Telephone Encounter (Signed)
Last office visit [id number] for hospital follow up.  Last refilled 08/12/2018 for #120 with 1 refill.  No future appointments.

## 2018-10-21 NOTE — Telephone Encounter (Signed)
LM on cell phone (okay per DPR) for patient to please call us back as soon as possible for hospital f/u call and appointment.

## 2018-10-22 NOTE — Telephone Encounter (Signed)
Transition Care Management Follow-up Telephone Call   Date discharged? 10/20/2018   How have you been since you were released from the hospital? Just feeling tired.   Do you understand why you were in the hospital? yes   Do you understand the discharge instructions? yes   Where were you discharged to? Home   Items Reviewed:  Medications reviewed: Yes  Allergies reviewed: Yes  Dietary changes reviewed: Yes-no changes.  Referrals reviewed: Yes   Functional Questionnaire:   Activities of Daily Living (ADLs):   She states they are independent in the following: ambulation, feeding, bathing, dressing, hygiene, toileting, continence, grooming. States they require assistance with the following: none   Any transportation issues/concerns?:No   Any patient concerns? No   Confirmed importance and date/time of follow-up visits scheduled yes  Provider Appointment booked with Dr Diona Browner on 10/28/18  Confirmed with patient if condition begins to worsen call PCP or go to the ER.  Patient was given the office number and encouraged to call back with question or concerns.  : Yes

## 2018-10-23 NOTE — Discharge Summary (Signed)
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 Please follow up with PCP in 1 week   Follow-up Information    Diona Browner, Amy E, MD. Schedule an appointment as soon as possible for a visit in 1 week(s).   Specialty:  Family Medicine Contact information: [place] [address] [phone]          Diet recommendation: carb modified diet  Activity: The patient is advised to gradually reintroduce usual activities.  Discharge Condition: good  Code Status: full code  History of present illness: As per the H and P dictated on admission, "[name] [name] is a 57 y.o. female with medical history significant of DM2 with neuropathy and nephropathy, CKD, IBS, vertigo, hypothyroidism, HTN, HLD, GERD, CHF (30-35 % last checked in 09/2017), anxiety who presents with DKA.  She reports that she has difficulty controlling her BS.  She ate dinner last night and then forgot to take her evening insulin.  She woke up at 2am with severe nausea and vomiting. She notes further symptoms included cough for a few days, but no sputum production, no fever or chills.  She also notes polydipsia and polyuria.  She is requesting to drink fluids.  She has chronic chest pain and wears a nitro patch.  I  reviewed her last Endocrine note from 10/30.  At that time, she was started on Tresiba 24 units, Humalog 8 units with a sliding scale.  Ms. Mangiapane was able to correctly recite these dosages to me, she states she just neglected to take her insulin last night.  She has had no other changes, missed doses, or dietary changes.  She reports also having a history of gastroparesis and being on reglan. "  Hospital Course:  Summary of her active problems in the hospital is as following. DKA (diabetic ketoacidoses) - Placed on the glucose stabilizer with IVF, 1 hour glucose and transition to D5 1/2 NS at the appropriate times.  - Given her intermediate K value, she will also have 2 10 meq runs of potassium - As she has only a mild acidosis, no need for bicarb - Transitioned to long acting insulin when gap closed,    Poorly controlled type 2 diabetes mellitus with peripheral neuropathy  - Reviewed last endocrinology note, she has very uncontrolled and brittle diabetes.  She is able to recite her medications to me, but continues to miss doses (missed only one dose and developed DKA, I am curious if she has missed others)  Gastroparesis due to DM  - continue reglan - Trial of compazine for nausea  Chronic systolic CHF (congestive heart failure)  - Monitor oxygenation closely, she has a history of EF of 30-35% and will be receiving quite a lot of fluids for DKA - Lasix  held currently, she reports on PRN use at home - Oxygen to keep saturation > 92%  Hypothyroidism - Continue synthroid    Hyperlipidemia - Continue atorvastatin and zetia    Generalized anxiety disorder - Continue sertraline    GERD - Continue protonix    Essential hypertension - BP initially low, trending higher with fluids - Restart losartan and metoprolol on admission as she is improving    Kidney disease, chronic, 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  Patient was ambulatory without any assistance. On the day of the discharge the patient's vitals were stable , and no other acute medical condition were reported by patient. the patient was felt safe to be discharge at home with family.  Consultants: none Procedures: none  DISCHARGE MEDICATION: Allergies as of 10/20/2018      Reactions   Codeine Hives, Anxiety, Other (See Comments)      Medication List    TAKE these medications   aspirin 81 MG chewable tablet Chew 1 tablet (81 mg total) by mouth daily.   atorvastatin 80 MG tablet Commonly known as:  LIPITOR Take 1 tablet (80 mg total) by mouth daily at 6 PM.   GLUCAGON EMERGENCY 1 MG injection Generic drug:  glucagon Inject 1 mg as directed once.   insulin lispro 100 UNIT/ML KiwkPen Commonly known as:  HUMALOG Inject into skin 3 times a day at meal time according to the following sliding scale: CBG 70-120 - 0 units / CBG 121-150 - 1 unit / CBG 151-200 - 2 units / CBG 201-250 - 3 units / CBG 251-300 - 5 units / CBG 301-350 - 7 units / CBG 351-400+ - 9 units  YOU MUST FOLLOW A STRICT DIABETIC DIET   levothyroxine 150 MCG tablet Commonly known as:  SYNTHROID, LEVOTHROID TAKE 1 TABLET BY MOUTH EVERY DAY What changed:  when to take this   losartan 25 MG tablet Commonly known as:  COZAAR TAKE 1 TABLET BY MOUTH EVERY DAY   metoprolol succinate 25 MG 24 hr tablet Commonly known as:  TOPROL-XL TAKE 1 TABLET BY MOUTH EVERY DAY   nitroGLYCERIN 0.2 mg/hr patch Commonly known as:  NITRODUR - Dosed in mg/24 hr APPLY 1 PATCH TO SKIN EVERY DAY AS NEEDED What changed:  See the new instructions.   onetouch ultrasoft lancets Use to check blood sugar daily as needed.  Dx: E11.10   ONETOUCH VERIO test strip Generic drug:  glucose  blood USE TO CHECK BLOOD SUGAR DAILY AS NEEDED. DX: E11.10   pantoprazole 40 MG tablet Commonly known as:  PROTONIX Take 1 tablet (40 mg total) by mouth daily at 12 noon. What changed:  when to take this   Pen Needles 31G X 8 MM Misc Use to inject insulin 4 times a day.  Dx: E11.10   promethazine 25 MG tablet Commonly known as:  PHENERGAN Take 1 tablet (25 mg total) by mouth every 8 (eight) hours as needed for nausea or vomiting.   sertraline 50 MG tablet Commonly known as:  ZOLOFT TAKE 1 TABLET BY MOUTH EVERY DAY   TRESIBA FLEXTOUCH 100 UNIT/ML Sopn FlexTouch Pen Generic drug:  insulin degludec Inject 0.24 mLs (24 Units total) into the skin at bedtime. On 10/20/18 take at  lunch time, on 10/21/18 take at dinner time. On 10/22/18 resume at bedtime regimen. What changed:    how to take this  additional instructions      Allergies  Allergen Reactions  . Codeine Hives, Anxiety and Other (See Comments)   Discharge Instructions    Diet - low sodium heart healthy   Complete by:  As directed    Diet Carb Modified   Complete by:  As directed    Discharge instructions   Complete by:  As directed    It is important that you read following instructions as well as go over your medication list with RN to help you understand your care after this hospitalization.  Discharge Instructions: Please follow-up with PCP in one week  Please request your primary care physician to go over all Hospital Tests and Procedure/Radiological results at the follow up,  Please get all Hospital records sent to your PCP by signing hospital release before you go home.   Do not take more than prescribed Pain, Sleep and Anxiety Medications. You were cared for by a hospitalist during your hospital stay. If you have any questions about your discharge medications or the care you received while you were in the hospital after you are discharged, you can call the unit you were admitted to and ask to speak with  the hospitalist on call if the hospitalist that took care of you is not available.  Once you are discharged, your primary care physician will handle any further medical issues. Please note that NO REFILLS for any discharge medications will be authorized once you are discharged, as it is imperative that you return to your primary care physician (or establish a relationship with a primary care physician if you do not have one) for your aftercare needs so that they can reassess your need for medications and monitor your lab values. You Must read complete instructions/literature along with all the possible adverse reactions/side effects for all the Medicines you take and that have been prescribed to you. Take any new Medicines after you have completely understood and accept all the possible adverse reactions/side effects. Wear Seat belts while driving. If you have smoked or chewed Tobacco in the last 2 yrs please stop smoking and/or stop any Recreational drug use.   Increase activity slowly   Complete by:  As directed      Discharge Exam: Filed Weights   10/19/18 0954  Weight: 63 kg   Vitals:   10/20/18 0809 10/20/18 1620  BP: 123/75 103/63  Pulse: 97 91  Resp: 18 18  Temp: 98.7 F (37.1 C) 98.4 F (36.9 C)  SpO2: 99% 98%   General: Appear in no distress, no Rash; Oral Mucosa moist. Cardiovascular: S1 and S2 Present, no Murmur, no JVD Respiratory: Bilateral Air entry present and Clear to Auscultation, no Crackles, no wheezes Abdomen: Bowel Sound present, Soft and no tenderness Extremities: no Pedal edema, no calf tenderness Neurology: Grossly no focal neuro deficit.  The results of significant diagnostics from this hospitalization (including imaging, microbiology, ancillary and laboratory) are listed below for reference.    Significant Diagnostic Studies: Dg Chest Port 1 View  Result Date: 10/19/2018 CLINICAL DATA:  Cough, shortness of breath. EXAM: PORTABLE CHEST 1 VIEW COMPARISON:   None. FINDINGS: The heart size and mediastinal contours are within normal limits. Both lungs are clear. No pneumothorax or pleural effusion is noted. The visualized skeletal structures are unremarkable. IMPRESSION: No active disease. Electronically Signed   By: Sabino Dick  Brooke Bonito, M.D.   On: 10/19/2018 16:53    Microbiology: No results found for this or any previous visit (from the past 240 hour(s)).   Labs: CBC: Recent Labs  Lab 10/19/18 1002  WBC 16.9*  NEUTROABS 15.1*  HGB 16.3*  HCT 50.3*  MCV 86.1  PLT 366*   Basic Metabolic Panel: Recent Labs  Lab 10/19/18 1450 10/19/18 1912 10/19/18 2337 10/20/18 0310 10/20/18 1428  NA 134* 138 136 134* 135  K 3.2* 3.2* 3.9 2.9* 4.2  CL 101 105 107 105 106  CO2 20* 22 21* 22 21*  GLUCOSE 365* 157* 174* 182* 138*  BUN 12 12 10 9 6   CREATININE 0.83 0.64 0.53 0.53 0.59  CALCIUM 9.2 8.9 8.3* 8.2* 8.6*  MG  --   --   --  1.8  --    Liver Function Tests: Recent Labs  Lab 10/19/18 1002  AST 28  ALT 25  ALKPHOS 98  BILITOT 1.3*  PROT 8.3*  ALBUMIN 4.6   Recent Labs  Lab 10/19/18 1002  LIPASE 24   No results for input(s): AMMONIA in the last 168 hours. Cardiac Enzymes: No results for input(s): CKTOTAL, CKMB, CKMBINDEX, TROPONINI in the last 168 hours. BNP (last 3 results) No results for input(s): BNP in the last 8760 hours. CBG: Recent Labs  Lab 10/20/18 0727 10/20/18 0840 10/20/18 0958 10/20/18 1054 10/20/18 1625  GLUCAP 176* 99 156* 156* 122*   Time spent: 35 minutes  Signed:  Berle Mull  [facility] 10/20/2018 , 4:28 PM

## 2018-10-28 ENCOUNTER — Encounter: Payer: Self-pay | Admitting: Family Medicine

## 2018-10-28 ENCOUNTER — Ambulatory Visit: Payer: 59 | Admitting: Family Medicine

## 2018-10-28 VITALS — BP 100/78 | HR 96 | Temp 98.0°F | Ht 59.0 in | Wt 146.2 lb

## 2018-10-28 DIAGNOSIS — E1165 Type 2 diabetes mellitus with hyperglycemia: Secondary | ICD-10-CM | POA: Diagnosis not present

## 2018-10-28 DIAGNOSIS — E1142 Type 2 diabetes mellitus with diabetic polyneuropathy: Secondary | ICD-10-CM | POA: Diagnosis not present

## 2018-10-28 DIAGNOSIS — E1143 Type 2 diabetes mellitus with diabetic autonomic (poly)neuropathy: Secondary | ICD-10-CM | POA: Diagnosis not present

## 2018-10-28 DIAGNOSIS — I5022 Chronic systolic (congestive) heart failure: Secondary | ICD-10-CM

## 2018-10-28 DIAGNOSIS — K3184 Gastroparesis: Secondary | ICD-10-CM

## 2018-10-28 LAB — HM DIABETES FOOT EXAM

## 2018-10-28 MED ORDER — PROMETHAZINE HCL 25 MG PO TABS: 25.0000 mg | ORAL_TABLET | Freq: Three times a day (TID) | ORAL | 0 refills | Status: DC | PRN

## 2018-10-28 NOTE — Patient Instructions (Addendum)
Can take an additional dose of Reglan is needed for nausea.  Keep up insulin.. Using phone alarm.  Put a note on nightstand to remind you to take insulin at night even when tired. Keep appt with ENDO as scheduled. Keep to low carbohydrate during holiday season... Keep with carb counting.   Make yearly eye doctor appt... Have them send [place] the records.

## 2018-10-28 NOTE — Assessment & Plan Note (Signed)
Stable fluid status today in office.

## 2018-10-28 NOTE — Assessment & Plan Note (Signed)
Encouraged pt's compliance and spent time discussing ways to avoid missing medication. Set up plan.  Encouraged continued follow up with ENDO.  Reviewed carb counting with holidays ahead.

## 2018-10-28 NOTE — Assessment & Plan Note (Signed)
Poor control.. Can take additional dose of reglan if symptoms increasing.

## 2018-10-28 NOTE — Progress Notes (Signed)
Subjective:    Patient ID: [name] [name], female    DOB: [birth year], 57 y.o.   MRN: [id number]  HPI  57 year old female noncompliant in past with  Insulin dependant DM regimen causing multiple repeated hospitalizations for DM, abd pain and diabetic ketoacidosis.  Most recent hospitalization 10/29/2018 to 10/20/2018 TCM call made  Summary of hospital course is  copied below:  Hospital Course:  Summary of her active problems in the hospital is as following. DKA (diabetic ketoacidoses) - Placed on the glucose stabilizer with IVF, 1 hour glucose and transition to D5 1/2 NS at the appropriate times.  - Given her intermediate K value, she will also have 2 10 meq runs of potassium - As she has only a mild acidosis, no need for bicarb - Transitioned to long acting insulin when gap closed,  Poorly controlled type 2 diabetes mellitus with peripheral neuropathy - Reviewed last endocrinology note, she has very uncontrolled and brittle diabetes. She is able to recite her medications to me, but continues to miss doses (missed only one dose and developed DKA, I am curious if she has missed others)  Gastroparesis due to DM - continue reglan - Trial of compazine for nausea  Chronic systolic CHF (congestive heart failure) - Monitor oxygenation closely, she has a history of EF of 30-35% and will be receiving quite a lot of fluids for DKA - Lasix held currently, she reports on PRN use at home - Oxygen to keep saturation > 92%  Hypothyroidism - Continue synthroid  Hyperlipidemia - Continue atorvastatin and zetia  Generalized anxiety disorder - Continue sertraline  GERD - Continue protonix  Essential hypertension - BP initially low, trending higher with fluids - Restart losartan and metoprolol on admission as she is improving  Kidney disease, chronic, stage 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  Patient was ambulatory without any assistance. On the day of the discharge the patient's vitals were stable , and no other acute medical condition were reported by patient. the patient was felt safe to be discharge at home with family.  10/28/18 Today:  She reports she is feeling better overall. Tired overall. Resolved nausea. Nml BMs. No abdominal pain. She did acknowledge she did not take insulin the night before going to hospital given migraine and nausea. She reports she is compliant with her medication now.  Had been ding better with checking blood sugars with Elenor Legato meter   She feels a lot of her hospitalization are related to Gastroparesis.  ENDO: Dr. Hartford Poli... New. Has glucagon emergency kit for  hypoglycemia.   Lab Results  Component Value Date   HGBA1C 14.5 (H) 10/19/2018    At discharge electrolytes and kidney function back to baseline.  Blood pressure 100/78, pulse 96, temperature 98 F (36.7 C), temperature source Oral, height '4\' 11"'  (1.499 m), weight 146 lb 4 oz (66.3 kg), last menstrual period 08/11/2012.  Review of Systems  Constitutional: Negative for fatigue and fever.  HENT: Negative for congestion and ear pain.   Eyes: Negative for pain.  Respiratory: Negative for cough, chest tightness and shortness of breath.   Cardiovascular: Negative for chest pain, palpitations and leg swelling.  Gastrointestinal: Negative for abdominal pain.  Genitourinary: Negative for dysuria and vaginal bleeding.  Musculoskeletal:  Negative for back pain.  Neurological: Negative for syncope, light-headedness and headaches.  Psychiatric/Behavioral: Negative for dysphoric mood.       Objective:   Physical Exam  Constitutional: Vital signs are normal. She appears well-developed and well-nourished. She is  cooperative.  Non-toxic appearance. She does not appear ill. No distress.  HENT:  Head: Normocephalic.  Right Ear: Hearing, tympanic membrane, external ear and ear canal normal. Tympanic membrane is not erythematous, not retracted and not bulging.  Left Ear: Hearing, tympanic membrane, external ear and ear canal normal. Tympanic membrane is not erythematous, not retracted and not bulging.  Nose: No mucosal edema or rhinorrhea. Right sinus exhibits no maxillary sinus tenderness and no frontal sinus tenderness. Left sinus exhibits no maxillary sinus tenderness and no frontal sinus tenderness.  Mouth/Throat: Uvula is midline, oropharynx is clear and moist and mucous membranes are normal.  Eyes: Pupils are equal, round, and reactive to light. Conjunctivae, EOM and lids are normal. Lids are everted and swept, no foreign bodies found.  Neck: Trachea normal and normal range of motion. Neck supple. Carotid bruit is not present. No thyroid mass and no thyromegaly present.  Cardiovascular: Normal rate, regular rhythm, S1 normal, S2 normal, normal heart sounds, intact distal pulses and normal pulses. Exam reveals no gallop and no friction rub.  No murmur heard. Pulmonary/Chest: Effort normal and breath sounds normal. No tachypnea. No respiratory distress. She has no decreased breath sounds. She has no wheezes. She has no rhonchi. She has no rales.  Abdominal: Soft. Normal appearance and bowel sounds are normal. There is no tenderness.  Neurological: She is alert.  Skin: Skin is warm, dry and intact. No rash noted.  Psychiatric: Her speech is normal and behavior is normal. Judgment and thought content normal. Her mood appears not anxious. Cognition and memory are normal. She does not exhibit a depressed mood.     Diabetic foot exam: Normal inspection No skin breakdown No calluses  Normal DP pulses decreasedsensation to light touch and monofilament Nails normal     Assessment & Plan:

## 2018-10-29 ENCOUNTER — Telehealth: Payer: Self-pay | Admitting: Cardiology

## 2018-10-29 NOTE — Telephone Encounter (Signed)
° ° °  [employer] called requesting note be added to chart. [employer] is trying to contact patient ti enroll in HF program. [place] [phone] ext [phone]  Message left for patient to contact [facility] , also to call for 26mo f/u

## 2018-11-08 ENCOUNTER — Other Ambulatory Visit: Payer: Self-pay | Admitting: Family Medicine

## 2018-11-15 ENCOUNTER — Other Ambulatory Visit: Payer: Self-pay

## 2018-11-15 ENCOUNTER — Emergency Department (HOSPITAL_COMMUNITY)
Admission: EM | Admit: 2018-11-15 | Discharge: 2018-11-16 | Disposition: A | Discharge status: Home / Self Care | Payer: 59 | Attending: Emergency Medicine | Admitting: Emergency Medicine

## 2018-11-15 DIAGNOSIS — I252 Old myocardial infarction: Secondary | ICD-10-CM | POA: Insufficient documentation

## 2018-11-15 DIAGNOSIS — R000 Tachycardia, unspecified: Secondary | ICD-10-CM | POA: Diagnosis not present

## 2018-11-15 DIAGNOSIS — E1142 Type 2 diabetes mellitus with diabetic polyneuropathy: Secondary | ICD-10-CM | POA: Insufficient documentation

## 2018-11-15 DIAGNOSIS — R1032 Left lower quadrant pain: Secondary | ICD-10-CM | POA: Insufficient documentation

## 2018-11-15 DIAGNOSIS — N182 Chronic kidney disease, stage 2 (mild): Secondary | ICD-10-CM | POA: Insufficient documentation

## 2018-11-15 DIAGNOSIS — Z87891 Personal history of nicotine dependence: Secondary | ICD-10-CM | POA: Insufficient documentation

## 2018-11-15 DIAGNOSIS — Z79899 Other long term (current) drug therapy: Secondary | ICD-10-CM | POA: Insufficient documentation

## 2018-11-15 DIAGNOSIS — E039 Hypothyroidism, unspecified: Secondary | ICD-10-CM | POA: Insufficient documentation

## 2018-11-15 DIAGNOSIS — I5022 Chronic systolic (congestive) heart failure: Secondary | ICD-10-CM | POA: Diagnosis not present

## 2018-11-15 DIAGNOSIS — E1122 Type 2 diabetes mellitus with diabetic chronic kidney disease: Secondary | ICD-10-CM | POA: Diagnosis not present

## 2018-11-15 DIAGNOSIS — I1310 Hypertensive heart and chronic kidney disease without heart failure, with stage 1 through stage 4 chronic kidney disease, or unspecified chronic kidney disease: Secondary | ICD-10-CM | POA: Diagnosis not present

## 2018-11-15 DIAGNOSIS — I4891 Unspecified atrial fibrillation: Secondary | ICD-10-CM | POA: Insufficient documentation

## 2018-11-15 DIAGNOSIS — R739 Hyperglycemia, unspecified: Secondary | ICD-10-CM

## 2018-11-15 DIAGNOSIS — Z794 Long term (current) use of insulin: Secondary | ICD-10-CM | POA: Insufficient documentation

## 2018-11-15 DIAGNOSIS — E1165 Type 2 diabetes mellitus with hyperglycemia: Secondary | ICD-10-CM | POA: Diagnosis not present

## 2018-11-15 DIAGNOSIS — Z7982 Long term (current) use of aspirin: Secondary | ICD-10-CM | POA: Insufficient documentation

## 2018-11-15 DIAGNOSIS — R112 Nausea with vomiting, unspecified: Secondary | ICD-10-CM | POA: Insufficient documentation

## 2018-11-15 DIAGNOSIS — R1084 Generalized abdominal pain: Secondary | ICD-10-CM | POA: Diagnosis not present

## 2018-11-15 DIAGNOSIS — R1110 Vomiting, unspecified: Secondary | ICD-10-CM | POA: Diagnosis not present

## 2018-11-15 LAB — COMPREHENSIVE METABOLIC PANEL
ALT: 17 U/L (ref 0–44)
AST: 13 U/L — AB (ref 15–41)
Albumin: 4.1 g/dL (ref 3.5–5.0)
Alkaline Phosphatase: 81 U/L (ref 38–126)
Anion gap: 19 — ABNORMAL HIGH (ref 5–15)
BILIRUBIN TOTAL: 1.8 mg/dL — AB (ref 0.3–1.2)
BUN: 19 mg/dL (ref 6–20)
CALCIUM: 8.8 mg/dL — AB (ref 8.9–10.3)
CO2: 19 mmol/L — AB (ref 22–32)
CREATININE: 0.8 mg/dL (ref 0.44–1.00)
Chloride: 93 mmol/L — ABNORMAL LOW (ref 98–111)
Glucose, Bld: 398 mg/dL — ABNORMAL HIGH (ref 70–99)
Potassium: 4 mmol/L (ref 3.5–5.1)
Sodium: 131 mmol/L — ABNORMAL LOW (ref 135–145)
Total Protein: 7.4 g/dL (ref 6.5–8.1)

## 2018-11-15 LAB — CBC
HEMATOCRIT: 47.1 % — AB (ref 36.0–46.0)
Hemoglobin: 15.5 g/dL — ABNORMAL HIGH (ref 12.0–15.0)
MCH: 28.8 pg (ref 26.0–34.0)
MCHC: 32.9 g/dL (ref 30.0–36.0)
MCV: 87.4 fL (ref 80.0–100.0)
PLATELETS: 355 10*3/uL (ref 150–400)
RBC: 5.39 MIL/uL — ABNORMAL HIGH (ref 3.87–5.11)
RDW: 14.3 % (ref 11.5–15.5)
WBC: 15.8 10*3/uL — ABNORMAL HIGH (ref 4.0–10.5)
nRBC: 0 % (ref 0.0–0.2)

## 2018-11-15 LAB — CBG MONITORING, ED: Glucose-Capillary: 392 mg/dL — ABNORMAL HIGH (ref 70–99)

## 2018-11-15 MED ORDER — METOCLOPRAMIDE HCL 5 MG/ML IJ SOLN
10.0000 mg | Freq: Once | INTRAMUSCULAR | Status: AC
  Administered 2018-11-16: 10 mg via INTRAVENOUS
  Filled 2018-11-15: qty 2

## 2018-11-15 MED ORDER — MORPHINE SULFATE (PF) 4 MG/ML IV SOLN
4.0000 mg | Freq: Once | INTRAVENOUS | Status: AC
  Administered 2018-11-16: 4 mg via INTRAVENOUS
  Filled 2018-11-15: qty 1

## 2018-11-15 MED ORDER — SODIUM CHLORIDE 0.9 % IV BOLUS
1000.0000 mL | Freq: Once | INTRAVENOUS | Status: AC
  Administered 2018-11-16: 1000 mL via INTRAVENOUS

## 2018-11-15 NOTE — ED Notes (Signed)
Pt stated she is unable to give urine sample at this time.

## 2018-11-15 NOTE — ED Triage Notes (Addendum)
Wednesday brought son to ED for n/v, abd pain.  Pt developed symptoms started Thursday, persisted into Friday and then resolved.  Symptoms returned today.  Pt reports blood sugar is typically in 300's, she took 8 units fast-acting insulin around 9pm.  4mg  zofran administered by EMS, pt endorses relief following medication.  20g IV in L AC.

## 2018-11-16 ENCOUNTER — Emergency Department (HOSPITAL_COMMUNITY): Payer: 59

## 2018-11-16 DIAGNOSIS — R1110 Vomiting, unspecified: Secondary | ICD-10-CM | POA: Diagnosis not present

## 2018-11-16 LAB — BLOOD GAS, VENOUS
Acid-base deficit: 6.2 mmol/L — ABNORMAL HIGH (ref 0.0–2.0)
BICARBONATE: 16 mmol/L — AB (ref 20.0–28.0)
O2 Saturation: 99.3 %
PATIENT TEMPERATURE: 98.6
PH VEN: 7.426 (ref 7.250–7.430)
PO2 VEN: 201 mmHg — AB (ref 32.0–45.0)
pCO2, Ven: 24.8 mmHg — ABNORMAL LOW (ref 44.0–60.0)

## 2018-11-16 LAB — URINALYSIS, ROUTINE W REFLEX MICROSCOPIC
Bilirubin Urine: NEGATIVE
Glucose, UA: >=500 mg/dL — AB
HGB URINE DIPSTICK: NEGATIVE
Ketones, ur: 80 mg/dL — AB
Nitrite: NEGATIVE
PROTEIN: 30 mg/dL — AB
SPECIFIC GRAVITY, URINE: 1.026 (ref 1.005–1.030)
pH: 5 (ref 5.0–8.0)

## 2018-11-16 MED ORDER — IOPAMIDOL (ISOVUE-300) INJECTION 61%
INTRAVENOUS | Status: AC
  Filled 2018-11-16: qty 100

## 2018-11-16 MED ORDER — IOPAMIDOL (ISOVUE-300) INJECTION 61%
100.0000 mL | Freq: Once | INTRAVENOUS | Status: AC | PRN
  Administered 2018-11-16: 100 mL via INTRAVENOUS

## 2018-11-16 MED ORDER — SODIUM CHLORIDE (PF) 0.9 % IJ SOLN
INTRAMUSCULAR | Status: AC
  Filled 2018-11-16: qty 50

## 2018-11-16 MED ORDER — SODIUM CHLORIDE 0.9 % IV BOLUS
1000.0000 mL | Freq: Once | INTRAVENOUS | Status: AC
  Administered 2018-11-16: 1000 mL via INTRAVENOUS

## 2018-11-16 NOTE — ED Notes (Signed)
Pt husband ETA 0415 for pt pick up.  Pt states improvement in symptoms, readiness for DC.

## 2018-11-16 NOTE — ED Notes (Signed)
Pt ambulated to BR with minimal assist. Tolerated well.

## 2018-11-16 NOTE — ED Provider Notes (Signed)
[facility] DEPT Provider Note   CSN: [id number] Arrival date & time: 11/15/18  2158     History   Chief Complaint Chief Complaint  Patient presents with  . Emesis  . Abdominal Pain    HPI AHNIKA HANNIBAL is a 57 y.o. female.  HPI Patient is a 57 year old female with complaints of nausea vomiting abdominal pain over the past 3 days.  Symptoms have been persistent.  Blood sugars have been mildly elevated into the 300s.  Patient reports left lower quadrant pain at this time.  No blood in her vomit.  No significant diarrhea.  Denies urinary symptoms.  No fevers or chills.  Reports decreased oral intake over the past several days.  Type II diabetic.   Past Medical History:  Diagnosis Date  . Anxiety   . B12 deficiency   . Chest pain 04/05/2017  . CHF (congestive heart failure) ([place])   . DKA (diabetic ketoacidoses) ([place]) 05/25/2018  . GERD (gastroesophageal reflux disease)   . Hyperlipidemia   . Hypertension    [name]  . Hypothyroidism   . IBS (irritable bowel syndrome)   . Intermittent vertigo   . 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 Leg cramping    "@ night" (09/18/2013)  . Migraine    "once q couple months" (09/18/2013)  . Osteoporosis   . Peripheral neuropathy   . Type II diabetes mellitus ([place])   . Umbilical hernia    unrepaired (09/18/2013)    Patient Active Problem List   Diagnosis Date Noted  . DKA (diabetic ketoacidoses) ([place]) 09/23/2018  . Tachycardia 08/04/2018  . MDD (major depressive disorder), single episode, mild ([place]) 07/01/2018  . Colitis 03/07/2018  . Chronic chest pain   . Chronic systolic CHF (congestive heart failure) ([place]) 01/14/2018  . Noncompliance 01/14/2018  . Migraines 12/11/2017  . Chest discomfort 09/18/2017  . DKA, type 2 ([place]) 09/17/2017  . Phrygian cap 06/20/2017  . Leukocytosis   . Lower abdominal pain   . Colitis presumed infectious   . Nonischemic cardiomyopathy  ([place]) 03/22/2017  . Hypokalemia 02/15/2017  . NSTEMI (non-ST elevated myocardial infarction) ([place]) 02/11/2017  . H. pylori infection 02/09/2017  . Hypomagnesemia 02/09/2017  . Gastroparesis due to DM ([place]) 01/22/2017  . Poorly controlled type 2 diabetes mellitus with peripheral neuropathy ([place]) 07/17/2016  . Atrial fibrillation ([place]) 02/01/2015  . 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 Hepatic lesion 09/18/2013  . Vitamin D deficiency 05/20/2012  . Essential hypertension 02/01/2011  . Osteoporosis 02/01/2011  . B12 deficiency 04/04/2010  . Peripheral neuropathy ([place]) 05/10/2009  . GERD 04/08/2009  . Generalized anxiety disorder 06/22/2008  . Hypothyroidism 04/29/2008  . Hyperlipidemia 04/29/2008  . Irritable bowel syndrome 04/29/2008    Past Surgical History:  Procedure Laterality Date  . CESAREAN SECTION  1989  . LAPAROSCOPIC ASSISTED VAGINAL HYSTERECTOMY  09/23/2012   Procedure: LAPAROSCOPIC ASSISTED VAGINAL HYSTERECTOMY;  Surgeon: Linda Hedges, DO;  Location: [facility];  Service: Gynecology;  Laterality: N/A;  pull Dr Gregor Hams instrument  . LEFT HEART CATH AND CORONARY ANGIOGRAPHY N/A 02/11/2017   Procedure: Left Heart Cath and Coronary Angiography;  Surgeon: Lorretta Harp, MD;  Location: [facility];  Service: Cardiovascular;  Laterality: N/A;  . TUBAL LIGATION Bilateral 1992     OB History    Gravida  2   Para  2   Term      Preterm  AB      Living        SAB      TAB      Ectopic      Multiple      Live Births               Home Medications    Prior to Admission medications   Medication Sig Start Date End Date Taking? Authorizing Provider  aspirin 81 MG chewable tablet Chew 1 tablet (81 mg total) by mouth daily. 02/12/17   Jani Gravel, MD  atorvastatin (LIPITOR) 80 MG tablet Take 1 tablet (80 mg total) by mouth daily at 6 PM. 03/13/18   Bedsole, Amy E, MD  GLUCAGON EMERGENCY 1 MG injection Inject 1 mg as directed  once. 10/08/18   [provider]  insulin lispro (HUMALOG KWIKPEN) 100 UNIT/ML KiwkPen Inject into skin 3 times a day at meal time according to the following sliding scale: CBG 70-120 - 0 units / CBG 121-150 - 1 unit / CBG 151-200 - 2 units / CBG 201-250 - 3 units / CBG 251-300 - 5 units / CBG 301-350 - 7 units / CBG 351-400+ - 9 units  YOU MUST FOLLOW A STRICT DIABETIC DIET 07/01/18   Bedsole, Amy E, MD  Insulin Pen Needle (PEN NEEDLES) 31G X 8 MM MISC Use to inject insulin 4 times a day.  Dx: E11.10 12/23/17   Jinny Sanders, MD  Lancets (ONETOUCH ULTRASOFT) lancets Use to check blood sugar daily as needed.  Dx: E11.10 12/23/17   Bedsole, Amy E, MD  levothyroxine (SYNTHROID, LEVOTHROID) 150 MCG tablet TAKE 1 TABLET BY MOUTH EVERY DAY 11/10/18   Bedsole, Amy E, MD  losartan (COZAAR) 25 MG tablet TAKE 1 TABLET BY MOUTH EVERY DAY Patient taking differently: Take 25 mg by mouth daily.  08/27/18   Barrett, Evelene Croon, PA-C  metoCLOPramide (REGLAN) 5 MG tablet TAKE 1 TABLET BY MOUTH 4 TIMES A DAY BEFORE MEALS AND AT BEDTIME 10/21/18   Bedsole, Amy E, MD  metoprolol succinate (TOPROL-XL) 25 MG 24 hr tablet TAKE 1 TABLET BY MOUTH EVERY DAY Patient taking differently: Take 25 mg by mouth daily.  10/06/18   Minus Breeding, MD  nitroGLYCERIN (NITRODUR - DOSED IN MG/24 HR) 0.2 mg/hr patch APPLY 1 PATCH TO SKIN EVERY DAY AS NEEDED Patient taking differently: Place 0.2 mg onto the skin daily.  08/27/18   Barrett, Evelene Croon, PA-C  ONETOUCH VERIO test strip USE TO CHECK BLOOD SUGAR DAILY AS NEEDED. DX: E11.10 09/21/18   Bedsole, Amy E, MD  pantoprazole (PROTONIX) 40 MG tablet Take 1 tablet (40 mg total) by mouth daily at 12 noon. Patient taking differently: Take 40 mg by mouth daily.  01/28/18   Bedsole, Amy E, MD  promethazine (PHENERGAN) 25 MG tablet Take 1 tablet (25 mg total) by mouth every 8 (eight) hours as needed for nausea or vomiting. 10/28/18   Bedsole, Amy E, MD  sertraline (ZOLOFT) 50 MG  tablet TAKE 1 TABLET BY MOUTH EVERY DAY Patient taking differently: Take 50 mg by mouth daily.  06/19/18   Bedsole, Amy E, MD  TRESIBA FLEXTOUCH 100 UNIT/ML SOPN FlexTouch Pen Inject 0.24 mLs (24 Units total) into the skin at bedtime. On 10/20/18 take at lunch time, on 10/21/18 take at dinner time. On 10/22/18 resume at bedtime regimen. 10/20/18   Lavina Hamman, MD    Family History Family History  Problem Relation Age of Onset  . Diabetes Other   .  Heart disease Other   . Heart failure Mother 53       Her heart skipped a beat and stopped - per pt  . Diabetes Maternal Grandmother   . Alzheimer's disease Maternal Grandmother   . Coronary artery disease Maternal Grandmother   . Heart attack Maternal Grandfather   . Heart failure Brother     Social History Social History   Tobacco Use  . Smoking status: Former Smoker    Years: 0.20    Types: Cigarettes    Last attempt to quit: 12/10/1990    Years since quitting: 27.9  . Smokeless tobacco: Never Used  Substance Use Topics  . Alcohol use: No    Alcohol/week: 0.0 standard drinks  . Drug use: Yes    Frequency: 3.0 times per week    Types: Marijuana    Comment: last use a couple of days ago     Allergies   Codeine   Review of Systems Review of Systems  All other systems reviewed and are negative.    Physical Exam Updated Vital Signs BP (!) 148/104   Pulse (!) 111   Temp 98.1 F (36.7 C) (Oral)   Resp 17   Ht 4\' 11"  (1.499 m)   Wt 64 kg   LMP 08/11/2012   SpO2 95%   BMI 28.48 kg/m   Physical Exam  Constitutional: She is oriented to person, place, and time. She appears well-developed and well-nourished. No distress.  HENT:  Head: Normocephalic and atraumatic.  Eyes: EOM are normal.  Neck: Normal range of motion.  Cardiovascular: Normal rate, regular rhythm and normal heart sounds.  Pulmonary/Chest: Effort normal and breath sounds normal.  Abdominal: Soft. She exhibits no distension. There is no tenderness.    Musculoskeletal: Normal range of motion.  Neurological: She is alert and oriented to person, place, and time.  Skin: Skin is warm and dry.  Psychiatric: She has a normal mood and affect. Judgment normal.  Nursing note and vitals reviewed.    ED Treatments / Results  Labs (all labs ordered are listed, but only abnormal results are displayed) Labs Reviewed  CBC - Abnormal; Notable for the following components:      Result Value   WBC 15.8 (*)    RBC 5.39 (*)    Hemoglobin 15.5 (*)    HCT 47.1 (*)    All other components within normal limits  COMPREHENSIVE METABOLIC PANEL - Abnormal; Notable for the following components:   Sodium 131 (*)    Chloride 93 (*)    CO2 19 (*)    Glucose, Bld 398 (*)    Calcium 8.8 (*)    AST 13 (*)    Total Bilirubin 1.8 (*)    Anion gap 19 (*)    All other components within normal limits  CBG MONITORING, ED - Abnormal; Notable for the following components:   Glucose-Capillary 392 (*)    All other components within normal limits  URINALYSIS, ROUTINE W REFLEX MICROSCOPIC  BLOOD GAS, VENOUS    EKG None  Radiology No results found.  Procedures Procedures (including critical care time)  Medications Ordered in ED Medications  sodium chloride 0.9 % bolus 1,000 mL (1,000 mLs Intravenous New Bag/Given 11/16/18 0002)  sodium chloride 0.9 % bolus 1,000 mL (has no administration in time range)  iopamidol (ISOVUE-300) 61 % injection 100 mL (has no administration in time range)  iopamidol (ISOVUE-300) 61 % injection (has no administration in time range)  sodium chloride (PF) 0.9 %  injection (has no administration in time range)  metoCLOPramide (REGLAN) injection 10 mg (10 mg Intravenous Given 11/16/18 0003)  morphine 4 MG/ML injection 4 mg (4 mg Intravenous Given 11/16/18 0003)     Initial Impression / Assessment and Plan / ED Course  I have reviewed the triage vital signs and the nursing notes.  Pertinent labs & imaging results that were  available during my care of the patient were reviewed by me and considered in my medical decision making (see chart for details).     We will hydrate patient in the emergency department.  Symptom control at this time.  Lower quadrant tenderness.  CT scan pending to evaluate for diverticulitis.  Care transferred to Dr. Leonette Monarch, for CT follow up and disposition  Final Clinical Impressions(s) / ED Diagnoses   Final diagnoses:  None    ED Discharge Orders    None       Jola Schmidt, MD 11/16/18 [phone]

## 2018-11-16 NOTE — ED Notes (Signed)
RT processing VBG at this time.

## 2018-11-16 NOTE — ED Provider Notes (Signed)
I assumed care of this patient from Dr. Venora Maples at 0000.  Please see their note for further details of Hx, PE.  Briefly patient is a 57 y.o. female who presented with abdominal pain and hyperglycemia.  Work-up was not suspicious for DKA.  Given her left lower quadrant abdominal pain and leukocytosis, CT scan was obtained to rule out diverticulitis..   Current plan is to follow-up CT.   Ct negative. No acidosis.  Improved pain and tolerating oral intake.  The patient is safe for discharge with strict return precautions.  Disposition: Discharge  Condition: Good  I have discussed the results, Dx and Tx plan with the patient who expressed understanding and agree(s) with the plan. Discharge instructions discussed at great length. The patient was given strict return precautions who verbalized understanding of the instructions. No further questions at time of discharge.    ED Discharge Orders    None       Follow Up: Jinny Sanders, MD [place] [address] [phone]  Schedule an appointment as soon as possible for a visit  As needed        Kyan Giannone, Grayce Sessions, MD 11/16/18 [phone]

## 2018-11-16 NOTE — ED Notes (Signed)
Pt transported to CT ?

## 2018-11-17 ENCOUNTER — Telehealth: Payer: Self-pay | Admitting: *Deleted

## 2018-11-17 NOTE — Telephone Encounter (Signed)
Left message on voicemail for patient to call back. Need to schedule an ER follow-up appointment with Dr. Diona Browner

## 2018-11-18 NOTE — Telephone Encounter (Signed)
Left another message on voicemail for patient to call back. 

## 2018-11-25 NOTE — Telephone Encounter (Signed)
Left another message for patient to call back 

## 2018-11-25 NOTE — Telephone Encounter (Signed)
Have left 3 messages for patient to call back to see about scheduling an ER follow-up appointment. Patient has not returned call. Patient has an appointment scheduled with Dr. Diona Browner 01/30/19.

## 2018-11-26 ENCOUNTER — Encounter (HOSPITAL_COMMUNITY): Payer: Self-pay | Admitting: Emergency Medicine

## 2018-11-26 ENCOUNTER — Emergency Department (HOSPITAL_COMMUNITY): Payer: 59

## 2018-11-26 ENCOUNTER — Other Ambulatory Visit: Payer: Self-pay

## 2018-11-26 ENCOUNTER — Observation Stay (HOSPITAL_COMMUNITY)
Admission: EM | Admit: 2018-11-26 | Discharge: 2018-11-28 | Disposition: A | Discharge status: Home / Self Care | Payer: 59 | Attending: Internal Medicine | Admitting: Internal Medicine

## 2018-11-26 ENCOUNTER — Inpatient Hospital Stay (HOSPITAL_COMMUNITY): Payer: 59

## 2018-11-26 DIAGNOSIS — R000 Tachycardia, unspecified: Secondary | ICD-10-CM | POA: Diagnosis not present

## 2018-11-26 DIAGNOSIS — I10 Essential (primary) hypertension: Secondary | ICD-10-CM | POA: Diagnosis not present

## 2018-11-26 DIAGNOSIS — R112 Nausea with vomiting, unspecified: Secondary | ICD-10-CM | POA: Diagnosis not present

## 2018-11-26 DIAGNOSIS — E1142 Type 2 diabetes mellitus with diabetic polyneuropathy: Secondary | ICD-10-CM | POA: Insufficient documentation

## 2018-11-26 DIAGNOSIS — E1110 Type 2 diabetes mellitus with ketoacidosis without coma: Principal | ICD-10-CM | POA: Diagnosis present

## 2018-11-26 DIAGNOSIS — K219 Gastro-esophageal reflux disease without esophagitis: Secondary | ICD-10-CM | POA: Insufficient documentation

## 2018-11-26 DIAGNOSIS — F419 Anxiety disorder, unspecified: Secondary | ICD-10-CM | POA: Insufficient documentation

## 2018-11-26 DIAGNOSIS — E1143 Type 2 diabetes mellitus with diabetic autonomic (poly)neuropathy: Secondary | ICD-10-CM | POA: Diagnosis not present

## 2018-11-26 DIAGNOSIS — Z833 Family history of diabetes mellitus: Secondary | ICD-10-CM | POA: Insufficient documentation

## 2018-11-26 DIAGNOSIS — E538 Deficiency of other specified B group vitamins: Secondary | ICD-10-CM | POA: Diagnosis not present

## 2018-11-26 DIAGNOSIS — I5022 Chronic systolic (congestive) heart failure: Secondary | ICD-10-CM | POA: Diagnosis not present

## 2018-11-26 DIAGNOSIS — K56609 Unspecified intestinal obstruction, unspecified as to partial versus complete obstruction: Secondary | ICD-10-CM | POA: Diagnosis not present

## 2018-11-26 DIAGNOSIS — I130 Hypertensive heart and chronic kidney disease with heart failure and stage 1 through stage 4 chronic kidney disease, or unspecified chronic kidney disease: Secondary | ICD-10-CM | POA: Insufficient documentation

## 2018-11-26 DIAGNOSIS — Z885 Allergy status to narcotic agent status: Secondary | ICD-10-CM | POA: Diagnosis not present

## 2018-11-26 DIAGNOSIS — Z8249 Family history of ischemic heart disease and other diseases of the circulatory system: Secondary | ICD-10-CM | POA: Insufficient documentation

## 2018-11-26 DIAGNOSIS — Z79899 Other long term (current) drug therapy: Secondary | ICD-10-CM | POA: Insufficient documentation

## 2018-11-26 DIAGNOSIS — N182 Chronic kidney disease, stage 2 (mild): Secondary | ICD-10-CM | POA: Insufficient documentation

## 2018-11-26 DIAGNOSIS — I4891 Unspecified atrial fibrillation: Secondary | ICD-10-CM | POA: Diagnosis not present

## 2018-11-26 DIAGNOSIS — Z87891 Personal history of nicotine dependence: Secondary | ICD-10-CM | POA: Diagnosis not present

## 2018-11-26 DIAGNOSIS — Z794 Long term (current) use of insulin: Secondary | ICD-10-CM | POA: Insufficient documentation

## 2018-11-26 DIAGNOSIS — E039 Hypothyroidism, unspecified: Secondary | ICD-10-CM | POA: Diagnosis not present

## 2018-11-26 DIAGNOSIS — Z7982 Long term (current) use of aspirin: Secondary | ICD-10-CM | POA: Insufficient documentation

## 2018-11-26 DIAGNOSIS — G43909 Migraine, unspecified, not intractable, without status migrainosus: Secondary | ICD-10-CM | POA: Insufficient documentation

## 2018-11-26 DIAGNOSIS — Z79890 Hormone replacement therapy (postmenopausal): Secondary | ICD-10-CM | POA: Insufficient documentation

## 2018-11-26 DIAGNOSIS — K3184 Gastroparesis: Secondary | ICD-10-CM | POA: Insufficient documentation

## 2018-11-26 DIAGNOSIS — E785 Hyperlipidemia, unspecified: Secondary | ICD-10-CM | POA: Diagnosis present

## 2018-11-26 DIAGNOSIS — R1110 Vomiting, unspecified: Secondary | ICD-10-CM | POA: Diagnosis not present

## 2018-11-26 DIAGNOSIS — K429 Umbilical hernia without obstruction or gangrene: Secondary | ICD-10-CM | POA: Diagnosis not present

## 2018-11-26 DIAGNOSIS — E1122 Type 2 diabetes mellitus with diabetic chronic kidney disease: Secondary | ICD-10-CM | POA: Insufficient documentation

## 2018-11-26 DIAGNOSIS — I480 Paroxysmal atrial fibrillation: Secondary | ICD-10-CM | POA: Diagnosis present

## 2018-11-26 DIAGNOSIS — K589 Irritable bowel syndrome without diarrhea: Secondary | ICD-10-CM | POA: Diagnosis not present

## 2018-11-26 DIAGNOSIS — E1165 Type 2 diabetes mellitus with hyperglycemia: Secondary | ICD-10-CM | POA: Diagnosis not present

## 2018-11-26 HISTORY — DX: Type 1 diabetes mellitus with hyperglycemia: E10.65

## 2018-11-26 HISTORY — DX: Reserved for concepts with insufficient information to code with codable children: IMO0002

## 2018-11-26 LAB — COMPREHENSIVE METABOLIC PANEL WITH GFR
ALT: 16 U/L (ref 0–44)
AST: 20 U/L (ref 15–41)
Albumin: 4.2 g/dL (ref 3.5–5.0)
Alkaline Phosphatase: 93 U/L (ref 38–126)
Anion gap: 21 — ABNORMAL HIGH (ref 5–15)
BUN: 10 mg/dL (ref 6–20)
CO2: 19 mmol/L — ABNORMAL LOW (ref 22–32)
Calcium: 9.2 mg/dL (ref 8.9–10.3)
Chloride: 96 mmol/L — ABNORMAL LOW (ref 98–111)
Creatinine, Ser: 0.79 mg/dL (ref 0.44–1.00)
GFR calc Af Amer: >60 mL/min
GFR calc non Af Amer: >60 mL/min
Glucose, Bld: 462 mg/dL — ABNORMAL HIGH (ref 70–99)
Potassium: 3.9 mmol/L (ref 3.5–5.1)
Sodium: 136 mmol/L (ref 135–145)
Total Bilirubin: 0.8 mg/dL (ref 0.3–1.2)
Total Protein: 7.6 g/dL (ref 6.5–8.1)

## 2018-11-26 LAB — I-STAT VENOUS BLOOD GAS, ED
Acid-base deficit: 2 mmol/L (ref 0.0–2.0)
Bicarbonate: 21.8 mmol/L (ref 20.0–28.0)
O2 Saturation: 91 %
TCO2: 23 mmol/L (ref 22–32)
pCO2, Ven: 35.8 mmHg — ABNORMAL LOW (ref 44.0–60.0)
pH, Ven: 7.393 (ref 7.250–7.430)
pO2, Ven: 60 mmHg — ABNORMAL HIGH (ref 32.0–45.0)

## 2018-11-26 LAB — CBG MONITORING, ED
Glucose-Capillary: 429 mg/dL — ABNORMAL HIGH (ref 70–99)
Glucose-Capillary: 360 mg/dL — ABNORMAL HIGH (ref 70–99)
Glucose-Capillary: 345 mg/dL — ABNORMAL HIGH (ref 70–99)

## 2018-11-26 LAB — CBC WITH DIFFERENTIAL/PLATELET
Abs Immature Granulocytes: 0.11 K/uL — ABNORMAL HIGH (ref 0.00–0.07)
Basophils Absolute: 0.1 K/uL (ref 0.0–0.1)
Basophils Relative: 0 %
Eosinophils Absolute: 0 K/uL (ref 0.0–0.5)
Eosinophils Relative: 0 %
HCT: 47.8 % — ABNORMAL HIGH (ref 36.0–46.0)
Hemoglobin: 15.8 g/dL — ABNORMAL HIGH (ref 12.0–15.0)
Immature Granulocytes: 1 %
Lymphocytes Relative: 6 %
Lymphs Abs: 1.2 K/uL (ref 0.7–4.0)
MCH: 27.7 pg (ref 26.0–34.0)
MCHC: 33.1 g/dL (ref 30.0–36.0)
MCV: 83.7 fL (ref 80.0–100.0)
Monocytes Absolute: 0.4 K/uL (ref 0.1–1.0)
Monocytes Relative: 2 %
Neutro Abs: 17.4 K/uL — ABNORMAL HIGH (ref 1.7–7.7)
Neutrophils Relative %: 91 %
Platelets: 399 K/uL (ref 150–400)
RBC: 5.71 MIL/uL — ABNORMAL HIGH (ref 3.87–5.11)
RDW: 14 % (ref 11.5–15.5)
WBC: 19.2 K/uL — ABNORMAL HIGH (ref 4.0–10.5)
nRBC: 0 % (ref 0.0–0.2)

## 2018-11-26 LAB — GLUCOSE, CAPILLARY
GLUCOSE-CAPILLARY: 133 mg/dL — AB (ref 70–99)
Glucose-Capillary: 276 mg/dL — ABNORMAL HIGH (ref 70–99)
Glucose-Capillary: 231 mg/dL — ABNORMAL HIGH (ref 70–99)
Glucose-Capillary: 163 mg/dL — ABNORMAL HIGH (ref 70–99)
Glucose-Capillary: 144 mg/dL — ABNORMAL HIGH (ref 70–99)
Glucose-Capillary: 158 mg/dL — ABNORMAL HIGH (ref 70–99)
Glucose-Capillary: 144 mg/dL — ABNORMAL HIGH (ref 70–99)

## 2018-11-26 LAB — URINALYSIS, COMPLETE (UACMP) WITH MICROSCOPIC
Bilirubin Urine: NEGATIVE
Glucose, UA: >=500 mg/dL — AB
Hgb urine dipstick: NEGATIVE
Ketones, ur: 80 mg/dL — AB
Leukocytes, UA: NEGATIVE
Nitrite: NEGATIVE
Protein, ur: 30 mg/dL — AB
Specific Gravity, Urine: 1.03 (ref 1.005–1.030)
pH: 6 (ref 5.0–8.0)

## 2018-11-26 LAB — BASIC METABOLIC PANEL
Anion gap: 14 (ref 5–15)
Anion gap: 8 (ref 5–15)
BUN: 10 mg/dL (ref 6–20)
BUN: 8 mg/dL (ref 6–20)
CALCIUM: 8.2 mg/dL — AB (ref 8.9–10.3)
CO2: 21 mmol/L — ABNORMAL LOW (ref 22–32)
CO2: 24 mmol/L (ref 22–32)
Calcium: 8.7 mg/dL — ABNORMAL LOW (ref 8.9–10.3)
Chloride: 102 mmol/L (ref 98–111)
Chloride: 103 mmol/L (ref 98–111)
Creatinine, Ser: 0.61 mg/dL (ref 0.44–1.00)
Creatinine, Ser: 0.45 mg/dL (ref 0.44–1.00)
GFR calc Af Amer: >60 mL/min (ref >60)
GFR calc Af Amer: >60 mL/min (ref >60)
GFR calc non Af Amer: >60 mL/min (ref >60)
Glucose, Bld: 285 mg/dL — ABNORMAL HIGH (ref 70–99)
Glucose, Bld: 153 mg/dL — ABNORMAL HIGH (ref 70–99)
Potassium: 3.7 mmol/L (ref 3.5–5.1)
Potassium: 3.4 mmol/L — ABNORMAL LOW (ref 3.5–5.1)
SODIUM: 137 mmol/L (ref 135–145)
SODIUM: 135 mmol/L (ref 135–145)

## 2018-11-26 LAB — LIPASE, BLOOD: Lipase: 71 U/L — ABNORMAL HIGH (ref 11–51)

## 2018-11-26 LAB — MRSA PCR SCREENING: MRSA by PCR: NEGATIVE

## 2018-11-26 LAB — BETA-HYDROXYBUTYRIC ACID: Beta-Hydroxybutyric Acid: 2.09 mmol/L — ABNORMAL HIGH (ref 0.05–0.27)

## 2018-11-26 MED ORDER — LEVOTHYROXINE SODIUM 75 MCG PO TABS
150.0000 ug | ORAL_TABLET | Freq: Every day | ORAL | Status: DC
  Administered 2018-11-27 – 2018-11-28 (×2): 150 ug via ORAL
  Filled 2018-11-26 (×2): qty 2

## 2018-11-26 MED ORDER — SODIUM CHLORIDE 0.45 % IV BOLUS: 1000.0000 mL | Freq: Once | INTRAVENOUS | Status: DC

## 2018-11-26 MED ORDER — POTASSIUM CHLORIDE 10 MEQ/100ML IV SOLN
10.0000 meq | Freq: Once | INTRAVENOUS | Status: AC
  Administered 2018-11-27: 10 meq via INTRAVENOUS
  Filled 2018-11-26: qty 100

## 2018-11-26 MED ORDER — SODIUM CHLORIDE 0.45 % IV BOLUS
1000.0000 mL | Freq: Once | INTRAVENOUS | Status: AC
  Administered 2018-11-26: 1000 mL via INTRAVENOUS

## 2018-11-26 MED ORDER — FAMOTIDINE IN NACL 20-0.9 MG/50ML-% IV SOLN
20.0000 mg | Freq: Two times a day (BID) | INTRAVENOUS | Status: DC
  Administered 2018-11-26 – 2018-11-27 (×3): 20 mg via INTRAVENOUS
  Filled 2018-11-26 (×4): qty 50

## 2018-11-26 MED ORDER — DEXTROSE-NACL 5-0.45 % IV SOLN: INTRAVENOUS | Status: DC

## 2018-11-26 MED ORDER — INSULIN REGULAR BOLUS VIA INFUSION
0.0000 [IU] | Freq: Three times a day (TID) | INTRAVENOUS | Status: DC
  Filled 2018-11-26: qty 10

## 2018-11-26 MED ORDER — SODIUM CHLORIDE 0.9 % IV SOLN: INTRAVENOUS | Status: AC

## 2018-11-26 MED ORDER — INSULIN REGULAR(HUMAN) IN NACL 100-0.9 UT/100ML-% IV SOLN
INTRAVENOUS | Status: DC
  Administered 2018-11-26: 3 [IU]/h via INTRAVENOUS
  Filled 2018-11-26: qty 100

## 2018-11-26 MED ORDER — SODIUM CHLORIDE 0.9 % IV SOLN
INTRAVENOUS | Status: DC
  Administered 2018-11-26: 14:00:00 via INTRAVENOUS

## 2018-11-26 MED ORDER — INSULIN REGULAR(HUMAN) IN NACL 100-0.9 UT/100ML-% IV SOLN: INTRAVENOUS | Status: DC

## 2018-11-26 MED ORDER — ONDANSETRON HCL 4 MG/2ML IJ SOLN: 4.0000 mg | Freq: Four times a day (QID) | INTRAMUSCULAR | Status: DC | PRN

## 2018-11-26 MED ORDER — POTASSIUM CHLORIDE 10 MEQ/100ML IV SOLN
10.0000 meq | INTRAVENOUS | Status: DC
  Administered 2018-11-26 (×3): 10 meq via INTRAVENOUS
  Filled 2018-11-26 (×3): qty 100

## 2018-11-26 MED ORDER — DEXTROSE-NACL 5-0.45 % IV SOLN
INTRAVENOUS | Status: DC
  Administered 2018-11-26 – 2018-11-27 (×2): via INTRAVENOUS

## 2018-11-26 MED ORDER — ACETAMINOPHEN 325 MG PO TABS
650.0000 mg | ORAL_TABLET | Freq: Four times a day (QID) | ORAL | Status: DC | PRN
  Administered 2018-11-27 (×2): 650 mg via ORAL
  Filled 2018-11-26 (×2): qty 2

## 2018-11-26 MED ORDER — IOHEXOL 300 MG/ML  SOLN
100.0000 mL | Freq: Once | INTRAMUSCULAR | Status: AC | PRN
  Administered 2018-11-26: 100 mL via INTRAVENOUS

## 2018-11-26 MED ORDER — ACETAMINOPHEN 650 MG RE SUPP: 650.0000 mg | Freq: Four times a day (QID) | RECTAL | Status: DC | PRN

## 2018-11-26 MED ORDER — LOSARTAN POTASSIUM 25 MG PO TABS
25.0000 mg | ORAL_TABLET | Freq: Every day | ORAL | Status: DC
  Administered 2018-11-27 – 2018-11-28 (×2): 25 mg via ORAL
  Filled 2018-11-26 (×2): qty 1

## 2018-11-26 MED ORDER — SERTRALINE HCL 50 MG PO TABS
50.0000 mg | ORAL_TABLET | Freq: Every day | ORAL | Status: DC
  Administered 2018-11-27 – 2018-11-28 (×2): 50 mg via ORAL
  Filled 2018-11-26 (×2): qty 1

## 2018-11-26 MED ORDER — ASPIRIN 81 MG PO CHEW
81.0000 mg | CHEWABLE_TABLET | Freq: Every day | ORAL | Status: DC
  Administered 2018-11-27 – 2018-11-28 (×2): 81 mg via ORAL
  Filled 2018-11-26 (×2): qty 1

## 2018-11-26 MED ORDER — METOPROLOL SUCCINATE ER 25 MG PO TB24
25.0000 mg | ORAL_TABLET | Freq: Every day | ORAL | Status: DC
  Administered 2018-11-27 – 2018-11-28 (×2): 25 mg via ORAL
  Filled 2018-11-26 (×2): qty 1

## 2018-11-26 MED ORDER — ONDANSETRON HCL 4 MG PO TABS: 4.0000 mg | ORAL_TABLET | Freq: Four times a day (QID) | ORAL | Status: DC | PRN

## 2018-11-26 MED ORDER — SODIUM CHLORIDE 0.9 % IV BOLUS
500.0000 mL | Freq: Once | INTRAVENOUS | Status: AC
  Administered 2018-11-26: 500 mL via INTRAVENOUS

## 2018-11-26 MED ORDER — NITROGLYCERIN 0.2 MG/HR TD PT24
0.2000 mg | MEDICATED_PATCH | Freq: Every day | TRANSDERMAL | Status: DC
  Administered 2018-11-26 – 2018-11-28 (×3): 0.2 mg via TRANSDERMAL
  Filled 2018-11-26 (×3): qty 1

## 2018-11-26 MED ORDER — DEXTROSE 50 % IV SOLN: 25.0000 mL | INTRAVENOUS | Status: DC | PRN

## 2018-11-26 MED ORDER — SODIUM CHLORIDE 0.9 % IV SOLN
INTRAVENOUS | Status: DC
  Administered 2018-11-26: 16:00:00 via INTRAVENOUS

## 2018-11-26 MED ORDER — SODIUM CHLORIDE 0.45 % IV SOLN
INTRAVENOUS | Status: DC
  Administered 2018-11-26: 16:00:00 via INTRAVENOUS

## 2018-11-26 MED ORDER — PANTOPRAZOLE SODIUM 40 MG PO TBEC
40.0000 mg | DELAYED_RELEASE_TABLET | Freq: Every day | ORAL | Status: DC
  Administered 2018-11-27 – 2018-11-28 (×2): 40 mg via ORAL
  Filled 2018-11-26 (×2): qty 1

## 2018-11-26 MED ORDER — METOCLOPRAMIDE HCL 5 MG/ML IJ SOLN
10.0000 mg | Freq: Four times a day (QID) | INTRAMUSCULAR | Status: DC
  Administered 2018-11-26 – 2018-11-28 (×7): 10 mg via INTRAVENOUS
  Filled 2018-11-26 (×7): qty 2

## 2018-11-26 NOTE — H&P (Signed)
History and Physical    [name] [name] FAO:[id number] DOB: Jun 19, 1961 DOA: 11/26/2018  PCP: Jinny Sanders, MD   Patient coming from: Home.  I have personally briefly reviewed patient's old medical records in [place]  Chief Complaint: Nausea and vomiting.  HPI: [name] [name] is a 57 y.o. female with medical history significant of anxiety, B12 deficiency, episodes of chest pain, chronic systolic heart failure, diabetic ketoacidosis episode, GERD, hyperlipidemia, hypertension, hypothyroidism IBS, intermittent vertigo, chronic kidney disease, leg cramping, migraine headaches, osteoporosis, diabetic peripheral neuropathy, type 2 diabetes, on repair umbilical hernia who is coming to the emergency department with complaints of progressively worse abdominal pain associated with 7 episodes of nausea and emesis since about 3 AM this morning.  She denies diarrhea, constipation, melena or hematochezia.  No fever, chills, sore throat, wheezing, hemoptysis or dyspnea.  No chest pain, palpitations, dizziness, diaphoresis, PND, orthopnea or pitting edema lower extremities.  Denies dysuria, frequency or hematuria.  She denies any diet indiscretions.  She denies polyuria, polydipsia, polyphagia or blurred vision.  ED Course: Initial vital signs temperature 97.7 F, pulse 103, respirations 11, blood pressure 126/94 mmHg and O2 sat 100% on room air.  The patient was started on an insulin infusion and was given IV fluids, antiemetics and analgesics in the emergency department.  Urinalysis shows glucosuria more than 500, proteinuria of 30 and ketones of 80 mg/dL.  There was rare bacteria on microscopic.  White count is 19.2, hemoglobin 15.8 g/dL and platelets 399.  Lipase was 71 units.  CMP shows a chloride 96 and CO2 of 19 mmol/L with anion gap of 21.  Her glucose was 462 mg/dL.  All other CMP values are within normal limits.  Venous gas shows a normal pH both with decreased PCO2 of 35.8 and increased PO2  60.0 mmHg.  IMAGING: Acute abdomen and checks x-ray showed few mildly dilated loops of small intestine with air-fluid levels.  There are partial early small bowel obstruction is not excluded.  CT abdomen/pelvis at this time is pending.  Review of Systems: As per HPI otherwise 10 point review of systems negative.   Past Medical History:  Diagnosis Date  . Anxiety   . B12 deficiency   . Chest pain 04/05/2017  . CHF (congestive heart failure) ([place])   . DKA (diabetic ketoacidoses) ([place]) 05/25/2018  . GERD (gastroesophageal reflux disease)   . Hyperlipidemia   . Hypertension    [name]  . Hypothyroidism   . IBS (irritable bowel syndrome)   . Intermittent vertigo   . 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 Leg cramping    "@ night" (09/18/2013)  . Migraine    "once q couple months" (09/18/2013)  . Osteoporosis   . Peripheral neuropathy   . Type II diabetes mellitus ([place])   . Umbilical hernia    unrepaired (09/18/2013)    Past Surgical History:  Procedure Laterality Date  . CESAREAN SECTION  1989  . LAPAROSCOPIC ASSISTED VAGINAL HYSTERECTOMY  09/23/2012   Procedure: LAPAROSCOPIC ASSISTED VAGINAL HYSTERECTOMY;  Surgeon: Linda Hedges, DO;  Location: [facility];  Service: Gynecology;  Laterality: N/A;  pull Dr Gregor Hams instrument  . LEFT HEART CATH AND CORONARY ANGIOGRAPHY N/A 02/11/2017   Procedure: Left Heart Cath and Coronary Angiography;  Surgeon: Lorretta Harp, MD;  Location: [facility];  Service: Cardiovascular;  Laterality: N/A;  . TUBAL LIGATION Bilateral 1992     reports that she quit smoking about 27 years  ago. Her smoking use included cigarettes. She quit after 0.20 years of use. She has never used smokeless tobacco. She reports current drug use. Frequency: 3.00 times per week. Drug: Marijuana. She reports that she does not drink alcohol.  Allergies  Allergen Reactions  . Codeine Hives, Anxiety and Other (See Comments)    Family History    Problem Relation Age of Onset  . Diabetes Other   . Heart disease Other   . Heart failure Mother 23       Her heart skipped a beat and stopped - per pt  . Diabetes Maternal Grandmother   . Alzheimer's disease Maternal Grandmother   . Coronary artery disease Maternal Grandmother   . Heart attack Maternal Grandfather   . Heart failure Brother    Prior to Admission medications   Medication Sig Start Date End Date Taking? Authorizing Provider  aspirin 81 MG chewable tablet Chew 1 tablet (81 mg total) by mouth daily. 02/12/17  Yes Jani Gravel, MD  atorvastatin (LIPITOR) 80 MG tablet Take 1 tablet (80 mg total) by mouth daily at 6 PM. 03/13/18  Yes Bedsole, Amy E, MD  GLUCAGON EMERGENCY 1 MG injection Inject 1 mg as directed once as needed (blood sugar).  10/08/18  Yes [provider]  insulin lispro (HUMALOG KWIKPEN) 100 UNIT/ML KiwkPen Inject into skin 3 times a day at meal time according to the following sliding scale: CBG 70-120 - 0 units / CBG 121-150 - 1 unit / CBG 151-200 - 2 units / CBG 201-250 - 3 units / CBG 251-300 - 5 units / CBG 301-350 - 7 units / CBG 351-400+ - 9 units  YOU MUST FOLLOW A STRICT DIABETIC DIET 07/01/18  Yes Bedsole, Amy E, MD  levothyroxine (SYNTHROID, LEVOTHROID) 150 MCG tablet TAKE 1 TABLET BY MOUTH EVERY DAY Patient taking differently: Take 150 mcg by mouth daily before breakfast.  11/10/18  Yes Bedsole, Amy E, MD  losartan (COZAAR) 25 MG tablet TAKE 1 TABLET BY MOUTH EVERY DAY Patient taking differently: Take 25 mg by mouth daily.  08/27/18  Yes Barrett, Evelene Croon, PA-C  metoCLOPramide (REGLAN) 5 MG tablet TAKE 1 TABLET BY MOUTH 4 TIMES A DAY BEFORE MEALS AND AT BEDTIME Patient taking differently: Take 5 mg by mouth 4 (four) times daily -  before meals and at bedtime.  10/21/18  Yes Bedsole, Amy E, MD  metoprolol succinate (TOPROL-XL) 25 MG 24 hr tablet TAKE 1 TABLET BY MOUTH EVERY DAY Patient taking differently: Take 25 mg by mouth daily.  10/06/18  Yes  Minus Breeding, MD  nitroGLYCERIN (NITRODUR - DOSED IN MG/24 HR) 0.2 mg/hr patch APPLY 1 PATCH TO SKIN EVERY DAY AS NEEDED Patient taking differently: Place 0.2 mg onto the skin daily.  08/27/18  Yes Barrett, Evelene Croon, PA-C  pantoprazole (PROTONIX) 40 MG tablet Take 1 tablet (40 mg total) by mouth daily at 12 noon. Patient taking differently: Take 40 mg by mouth daily.  01/28/18  Yes Bedsole, Amy E, MD  promethazine (PHENERGAN) 25 MG tablet Take 1 tablet (25 mg total) by mouth every 8 (eight) hours as needed for nausea or vomiting. 10/28/18  Yes Bedsole, Amy E, MD  sertraline (ZOLOFT) 50 MG tablet TAKE 1 TABLET BY MOUTH EVERY DAY Patient taking differently: Take 50 mg by mouth daily.  06/19/18  Yes Bedsole, Amy E, MD  TRESIBA FLEXTOUCH 100 UNIT/ML SOPN FlexTouch Pen Inject 0.24 mLs (24 Units total) into the skin at bedtime. On 10/20/18 take at lunch  time, on 10/21/18 take at dinner time. On 10/22/18 resume at bedtime regimen. Patient taking differently: Inject 30 Units into the skin at bedtime.  10/20/18  Yes Lavina Hamman, MD  Insulin Pen Needle (PEN NEEDLES) 31G X 8 MM MISC Use to inject insulin 4 times a day.  Dx: E11.10 12/23/17   Jinny Sanders, MD  Lancets (ONETOUCH ULTRASOFT) lancets Use to check blood sugar daily as needed.  Dx: E11.10 12/23/17   Jinny Sanders, MD  ONETOUCH VERIO test strip USE TO CHECK BLOOD SUGAR DAILY AS NEEDED. DX: E11.10 09/21/18   Jinny Sanders, MD    Physical Exam: Vitals:   11/26/18 1330 11/26/18 1345 11/26/18 1400 11/26/18 1415  BP: 127/74 115/71 136/80 131/83  Pulse: 100 99 (!) 101 (!) 102  Resp: (!) 21 16 20 16   Temp:      TempSrc:      SpO2: 93% 98% 98% 96%  Weight:      Height:        Constitutional: Looks acutely ill, but currently in NAD, calm, comfortable Eyes: PERRL, lids and conjunctivae normal ENMT: Mucous membranes are mildly dry. Posterior pharynx clear of any exudate or lesions. Neck: Normal, supple, no masses, no thyromegaly Respiratory:  clear to auscultation bilaterally, no wheezing, no crackles. Normal respiratory effort. No accessory muscle use.  Cardiovascular: Tachycardic at 103 bpm, no murmurs / rubs / gallops. No extremity edema. 2+ pedal pulses. No carotid bruits.  Abdomen: Positive umbilical hernia, soft, positive epigastric tenderness, no guarding or rebound, no masses palpated. No hepatosplenomegaly. Bowel sounds positive.  Musculoskeletal: no clubbing / cyanosis. Good ROM, no contractures. Normal muscle tone.  Skin: no rashes, lesions, ulcers on limited dermatological examination. Neurologic: CN 2-12 grossly intact. Sensation intact, DTR normal. Strength 5/5 in all 4.  Psychiatric: Normal judgment and insight. Alert and oriented x 3. Normal mood.   Labs on Admission: I have personally reviewed following labs and imaging studies  CBC: Recent Labs  Lab 11/26/18 1310  WBC 19.2*  NEUTROABS 17.4*  HGB 15.8*  HCT 47.8*  MCV 83.7  PLT 408   Basic Metabolic Panel: Recent Labs  Lab 11/26/18 1310  NA 136  K 3.9  CL 96*  CO2 19*  GLUCOSE 462*  BUN 10  CREATININE 0.79  CALCIUM 9.2   GFR: Estimated Creatinine Clearance: 64.6 mL/min (by C-G formula based on SCr of 0.79 mg/dL). Liver Function Tests: Recent Labs  Lab 11/26/18 1310  AST 20  ALT 16  ALKPHOS 93  BILITOT 0.8  PROT 7.6  ALBUMIN 4.2   Recent Labs  Lab 11/26/18 1310  LIPASE 71*   No results for input(s): AMMONIA in the last 168 hours. Coagulation Profile: No results for input(s): INR, PROTIME in the last 168 hours. Cardiac Enzymes: No results for input(s): CKTOTAL, CKMB, CKMBINDEX, TROPONINI in the last 168 hours. BNP (last 3 results) No results for input(s): PROBNP in the last 8760 hours. HbA1C: No results for input(s): HGBA1C in the last 72 hours. CBG: Recent Labs  Lab 11/26/18 1301 11/26/18 1412 11/26/18 1520  GLUCAP 429* 360* 345*   Lipid Profile: No results for input(s): CHOL, HDL, LDLCALC, TRIG, CHOLHDL, LDLDIRECT in  the last 72 hours. Thyroid Function Tests: No results for input(s): TSH, T4TOTAL, FREET4, T3FREE, THYROIDAB in the last 72 hours. Anemia Panel: No results for input(s): VITAMINB12, FOLATE, FERRITIN, TIBC, IRON, RETICCTPCT in the last 72 hours. Urine analysis:    Component Value Date/Time   COLORURINE STRAW (A)  11/26/2018 1310   APPEARANCEUR CLEAR 11/26/2018 1310   LABSPEC 1.030 11/26/2018 1310   PHURINE 6.0 11/26/2018 1310   GLUCOSEU >=500 (A) 11/26/2018 1310   HGBUR NEGATIVE 11/26/2018 1310   BILIRUBINUR NEGATIVE 11/26/2018 1310   BILIRUBINUR negative 09/11/2016 1038   KETONESUR 80 (A) 11/26/2018 1310   PROTEINUR 30 (A) 11/26/2018 1310   UROBILINOGEN 0.2 09/11/2016 1038   UROBILINOGEN 0.2 10/28/2013 1723   NITRITE NEGATIVE 11/26/2018 1310   LEUKOCYTESUR NEGATIVE 11/26/2018 1310    Radiological Exams on Admission: Dg Abdomen Acute W/chest  Result Date: 11/26/2018 CLINICAL DATA:  Nausea and vomiting beginning today. EXAM: DG ABDOMEN ACUTE W/ 1V CHEST COMPARISON:  CT 10 days ago. FINDINGS: Few mildly dilated loops of small intestine with air-fluid levels. Partial or early small-bowel obstruction is not excluded. No free air. No abnormal calcifications. Chronic scoliosis. One-view chest shows overlying artifact. Heart and mediastinal shadows are normal. The lungs are clear. No free air seen under the diaphragm. IMPRESSION: Few mildly dilated loops of small intestine with air-fluid levels. Partial or early small bowel obstruction is not excluded. Electronically Signed   By: Nelson Chimes M.D.   On: 11/26/2018 15:21   09/20/2017 echo ------------------------------------------------------------------- LV EF: 30% -   35%  ------------------------------------------------------------------- Indications:      Abnormal EKG 794.31.  ------------------------------------------------------------------- History:   Risk factors:  Hypertension. Diabetes  mellitus. Dyslipidemia.  ------------------------------------------------------------------- Study Conclusions  - Left ventricle: The cavity size was normal. Systolic function was   moderately to severely reduced. The estimated ejection fraction   was in the range of 30% to 35%. Moderate diffuse hypokinesis with   regional variations. Due to tachycardia, there was fusion of   early and atrial contributions to ventricular filling. The study   is not technically sufficient to allow evaluation of LV diastolic   function. - Mitral valve: There was mild regurgitation.  Impressions:  - The wall motion abnormality has a similar distribution to March   2018, but is more severe. It does not follow typical coronary   distribution and a nonischemic cardiomyopathy is suspected.   Overall LVEF has deteriorated. The regional wall motion   abnormality distribution is suggestive of stress cardiomyopathy,   but this is not a chronic disorder and is only infrequently a   recurrent disorder.  EKG: Independently reviewed. Vent. rate 102 BPM PR interval * ms QRS duration 80 ms QT/QTc 353/460 ms P-R-T axes 66 96 43 Sinus tachycardia Borderline right axis deviation  Assessment/Plan Principal Problem:   DKA (diabetic ketoacidosis) ([place]) Admit to stepdown backslash inpatient. Keep n.p.o. Continue IV fluids. Continue insulin infusion. Monitor CBG hourly. Monitor electrolytes every 4 hours. Monitor intake and output.  Active Problems:   SBO (small bowel obstruction) ([name]) Currently n.p.o. CT abdomen/pelvis pending. May need NG tube placement and surgical consultation.    Hypothyroidism TSH was normal in October. Continue levothyroxine 150 mcg p.o. daily.    Hyperlipidemia Hold atorvastatin until the patient feels better.    Essential hypertension Continue losartan 25 mg p.o. daily. Continue metoprolol XL 25 mg p.o. daily. Monitor BP, HR, renal function and electrolytes.     Atrial fibrillation ([name]) CHA?DS?-VASc Score of at least 4. Not on anticoagulation. Continue aspirin. Continue metoprolol 25 mg p.o. daily.    Gastroparesis due to DM ([name]) Continue metoclopramide.    Chronic systolic CHF (congestive heart failure) ([name]) No signs of decompensation at this time. Continue careful IV hydration. Monitor intake and output.     DVT prophylaxis: SCDs.  Code Status: Full code. Family Communication: None bedside. Disposition Plan: Admit for DKA treatment.  Further bowel obstruction work-up. Consults called: Admission status: Inpatient/stepdown.   Reubin Milan MD [facility]  If 7PM-7AM, please contact night-coverage [email or web address] Password [facility]  11/26/2018, 3:46 PM

## 2018-11-26 NOTE — ED Triage Notes (Signed)
Pt arrives to ED from home with complaints of N/V since 0730 and hyperglycemia. EMS reports pt vomited 8-10 times today and has a BS of 405. Pt has been here for DKA multiple times. 4mg  zofran given via EMS. Pt placed in position of comfort with bed locked and lowered, call bell in reach.

## 2018-11-26 NOTE — ED Provider Notes (Signed)
[facility] Provider Note   CSN: [id number] Arrival date & time: 11/26/18  1300     History   Chief Complaint Chief Complaint  Patient presents with  . Hyperglycemia  . Emesis    HPI [name] [name] is a 57 y.o. female.  HPI Patient is a 57 year old female with history of type 2 diabetes presents the emergency department with severe onset nausea vomiting generalized abdominal pain that began approximately 730 this morning.  She was noted to be hyperglycemic as well.  She is vomited 8-10 times today.  Her blood sugar was 405.  She reports a history of recurrent hypoglycemia.  She was given Zofran by EMS but continues to have nausea at this time.  Patient was seen by myself 11 days ago in the ER with abdominal pain nausea vomiting at that time underwent CT imaging of her abdomen without acute pathology.  She was hydrated and was feeling better.  She states after discharge at that time she continued to feel better until this morning.  Symptoms are moderate in severity    Past Medical History:  Diagnosis Date  . Anxiety   . B12 deficiency   . Chest pain 04/05/2017  . CHF (congestive heart failure) ([place])   . DKA (diabetic ketoacidoses) ([place]) 05/25/2018  . GERD (gastroesophageal reflux disease)   . Hyperlipidemia   . Hypertension    [name]  . Hypothyroidism   . IBS (irritable bowel syndrome)   . Intermittent vertigo   . 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 Leg cramping    "@ night" (09/18/2013)  . Migraine    "once q couple months" (09/18/2013)  . Osteoporosis   . Peripheral neuropathy   . Type II diabetes mellitus ([place])   . Umbilical hernia    unrepaired (09/18/2013)    Patient Active Problem List   Diagnosis Date Noted  . DKA (diabetic ketoacidoses) ([place]) 09/23/2018  . Tachycardia 08/04/2018  . MDD (major depressive disorder), single episode, mild ([place]) 07/01/2018  . Colitis 03/07/2018  . Chronic  chest pain   . Chronic systolic CHF (congestive heart failure) ([place]) 01/14/2018  . Noncompliance 01/14/2018  . Migraines 12/11/2017  . Chest discomfort 09/18/2017  . DKA, type 2 ([place]) 09/17/2017  . Phrygian cap 06/20/2017  . Leukocytosis   . Lower abdominal pain   . Colitis presumed infectious   . Nonischemic cardiomyopathy ([place]) 03/22/2017  . Hypokalemia 02/15/2017  . NSTEMI (non-ST elevated myocardial infarction) ([place]) 02/11/2017  . H. pylori infection 02/09/2017  . Hypomagnesemia 02/09/2017  . Gastroparesis due to DM ([place]) 01/22/2017  . Poorly controlled type 2 diabetes mellitus with peripheral neuropathy ([place]) 07/17/2016  . Atrial fibrillation ([place]) 02/01/2015  . 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 Hepatic lesion 09/18/2013  . Vitamin D deficiency 05/20/2012  . Essential hypertension 02/01/2011  . Osteoporosis 02/01/2011  . B12 deficiency 04/04/2010  . Peripheral neuropathy ([place]) 05/10/2009  . GERD 04/08/2009  . Generalized anxiety disorder 06/22/2008  . Hypothyroidism 04/29/2008  . Hyperlipidemia 04/29/2008  . Irritable bowel syndrome 04/29/2008    Past Surgical History:  Procedure Laterality Date  . CESAREAN SECTION  1989  . LAPAROSCOPIC ASSISTED VAGINAL HYSTERECTOMY  09/23/2012   Procedure: LAPAROSCOPIC ASSISTED VAGINAL HYSTERECTOMY;  Surgeon: Linda Hedges, DO;  Location: [facility];  Service: Gynecology;  Laterality: N/A;  pull Dr Gregor Hams instrument  . LEFT HEART CATH AND CORONARY ANGIOGRAPHY N/A 02/11/2017   Procedure: Left  Heart Cath and Coronary Angiography;  Surgeon: Lorretta Harp, MD;  Location: [facility];  Service: Cardiovascular;  Laterality: N/A;  . TUBAL LIGATION Bilateral 1992     OB History    Gravida  2   Para  2   Term      Preterm      AB      Living        SAB      TAB      Ectopic      Multiple      Live Births               Home Medications    Prior to Admission medications     Medication Sig Start Date End Date Taking? Authorizing Provider  aspirin 81 MG chewable tablet Chew 1 tablet (81 mg total) by mouth daily. 02/12/17  Yes Jani Gravel, MD  atorvastatin (LIPITOR) 80 MG tablet Take 1 tablet (80 mg total) by mouth daily at 6 PM. 03/13/18  Yes Bedsole, Amy E, MD  GLUCAGON EMERGENCY 1 MG injection Inject 1 mg as directed once as needed (blood sugar).  10/08/18  Yes [provider]  insulin lispro (HUMALOG KWIKPEN) 100 UNIT/ML KiwkPen Inject into skin 3 times a day at meal time according to the following sliding scale: CBG 70-120 - 0 units / CBG 121-150 - 1 unit / CBG 151-200 - 2 units / CBG 201-250 - 3 units / CBG 251-300 - 5 units / CBG 301-350 - 7 units / CBG 351-400+ - 9 units  YOU MUST FOLLOW A STRICT DIABETIC DIET 07/01/18  Yes Bedsole, Amy E, MD  levothyroxine (SYNTHROID, LEVOTHROID) 150 MCG tablet TAKE 1 TABLET BY MOUTH EVERY DAY Patient taking differently: Take 150 mcg by mouth daily before breakfast.  11/10/18  Yes Bedsole, Amy E, MD  losartan (COZAAR) 25 MG tablet TAKE 1 TABLET BY MOUTH EVERY DAY Patient taking differently: Take 25 mg by mouth daily.  08/27/18  Yes Barrett, Evelene Croon, PA-C  metoCLOPramide (REGLAN) 5 MG tablet TAKE 1 TABLET BY MOUTH 4 TIMES A DAY BEFORE MEALS AND AT BEDTIME Patient taking differently: Take 5 mg by mouth 4 (four) times daily -  before meals and at bedtime.  10/21/18  Yes Bedsole, Amy E, MD  metoprolol succinate (TOPROL-XL) 25 MG 24 hr tablet TAKE 1 TABLET BY MOUTH EVERY DAY Patient taking differently: Take 25 mg by mouth daily.  10/06/18  Yes Minus Breeding, MD  nitroGLYCERIN (NITRODUR - DOSED IN MG/24 HR) 0.2 mg/hr patch APPLY 1 PATCH TO SKIN EVERY DAY AS NEEDED Patient taking differently: Place 0.2 mg onto the skin daily.  08/27/18  Yes Barrett, Evelene Croon, PA-C  pantoprazole (PROTONIX) 40 MG tablet Take 1 tablet (40 mg total) by mouth daily at 12 noon. Patient taking differently: Take 40 mg by mouth daily.  01/28/18  Yes  Bedsole, Amy E, MD  promethazine (PHENERGAN) 25 MG tablet Take 1 tablet (25 mg total) by mouth every 8 (eight) hours as needed for nausea or vomiting. 10/28/18  Yes Bedsole, Amy E, MD  sertraline (ZOLOFT) 50 MG tablet TAKE 1 TABLET BY MOUTH EVERY DAY Patient taking differently: Take 50 mg by mouth daily.  06/19/18  Yes Bedsole, Amy E, MD  TRESIBA FLEXTOUCH 100 UNIT/ML SOPN FlexTouch Pen Inject 0.24 mLs (24 Units total) into the skin at bedtime. On 10/20/18 take at lunch time, on 10/21/18 take at dinner time. On 10/22/18 resume at bedtime regimen. Patient  taking differently: Inject 30 Units into the skin at bedtime.  10/20/18  Yes Lavina Hamman, MD  Insulin Pen Needle (PEN NEEDLES) 31G X 8 MM MISC Use to inject insulin 4 times a day.  Dx: E11.10 12/23/17   Jinny Sanders, MD  Lancets (ONETOUCH ULTRASOFT) lancets Use to check blood sugar daily as needed.  Dx: E11.10 12/23/17   Jinny Sanders, MD  ONETOUCH VERIO test strip USE TO CHECK BLOOD SUGAR DAILY AS NEEDED. DX: E11.10 09/21/18   Jinny Sanders, MD    Family History Family History  Problem Relation Age of Onset  . Diabetes Other   . Heart disease Other   . Heart failure Mother 38       Her heart skipped a beat and stopped - per pt  . Diabetes Maternal Grandmother   . Alzheimer's disease Maternal Grandmother   . Coronary artery disease Maternal Grandmother   . Heart attack Maternal Grandfather   . Heart failure Brother     Social History Social History   Tobacco Use  . Smoking status: Former Smoker    Years: 0.20    Types: Cigarettes    Last attempt to quit: 12/10/1990    Years since quitting: 27.9  . Smokeless tobacco: Never Used  Substance Use Topics  . Alcohol use: No    Alcohol/week: 0.0 standard drinks  . Drug use: Yes    Frequency: 3.0 times per week    Types: Marijuana    Comment: last use a couple of days ago     Allergies   Codeine   Review of Systems Review of Systems  All other systems reviewed and are  negative.    Physical Exam Updated Vital Signs BP 131/83   Pulse (!) 102   Temp 97.7 F (36.5 C) (Oral)   Resp 16   Ht 4\' 11"  (1.499 m)   Wt 64 kg   LMP 08/11/2012   SpO2 96%   BMI 28.50 kg/m   Physical Exam Vitals signs and nursing note reviewed.  Constitutional:      General: She is not in acute distress.    Appearance: She is well-developed.  HENT:     Head: Normocephalic and atraumatic.  Neck:     Musculoskeletal: Normal range of motion.  Cardiovascular:     Rate and Rhythm: Normal rate and regular rhythm.     Heart sounds: Normal heart sounds.  Pulmonary:     Effort: Pulmonary effort is normal.     Breath sounds: Normal breath sounds.  Abdominal:     General: There is no distension.     Palpations: Abdomen is soft.     Tenderness: There is no abdominal tenderness.  Musculoskeletal: Normal range of motion.  Skin:    General: Skin is warm and dry.  Neurological:     Mental Status: She is alert and oriented to person, place, and time.  Psychiatric:        Judgment: Judgment normal.      ED Treatments / Results  Labs (all labs ordered are listed, but only abnormal results are displayed) Labs Reviewed  CBC WITH DIFFERENTIAL/PLATELET - Abnormal; Notable for the following components:      Result Value   WBC 19.2 (*)    RBC 5.71 (*)    Hemoglobin 15.8 (*)    HCT 47.8 (*)    Neutro Abs 17.4 (*)    Abs Immature Granulocytes 0.11 (*)    All other components within  normal limits  COMPREHENSIVE METABOLIC PANEL - Abnormal; Notable for the following components:   Chloride 96 (*)    CO2 19 (*)    Glucose, Bld 462 (*)    Anion gap 21 (*)    All other components within normal limits  LIPASE, BLOOD - Abnormal; Notable for the following components:   Lipase 71 (*)    All other components within normal limits  URINALYSIS, COMPLETE (UACMP) WITH MICROSCOPIC - Abnormal; Notable for the following components:   Color, Urine STRAW (*)    Glucose, UA >=500 (*)     Ketones, ur 80 (*)    Protein, ur 30 (*)    Bacteria, UA RARE (*)    All other components within normal limits  CBG MONITORING, ED - Abnormal; Notable for the following components:   Glucose-Capillary 429 (*)    All other components within normal limits  CBG MONITORING, ED - Abnormal; Notable for the following components:   Glucose-Capillary 360 (*)    All other components within normal limits  CBG MONITORING, ED - Abnormal; Notable for the following components:   Glucose-Capillary 345 (*)    All other components within normal limits  URINALYSIS, ROUTINE W REFLEX MICROSCOPIC  BETA-HYDROXYBUTYRIC ACID  BASIC METABOLIC PANEL  BASIC METABOLIC PANEL  BASIC METABOLIC PANEL  I-STAT VENOUS BLOOD GAS, ED    EKG None  Radiology Dg Abdomen Acute W/chest  Result Date: 11/26/2018 CLINICAL DATA:  Nausea and vomiting beginning today. EXAM: DG ABDOMEN ACUTE W/ 1V CHEST COMPARISON:  CT 10 days ago. FINDINGS: Few mildly dilated loops of small intestine with air-fluid levels. Partial or early small-bowel obstruction is not excluded. No free air. No abnormal calcifications. Chronic scoliosis. One-view chest shows overlying artifact. Heart and mediastinal shadows are normal. The lungs are clear. No free air seen under the diaphragm. IMPRESSION: Few mildly dilated loops of small intestine with air-fluid levels. Partial or early small bowel obstruction is not excluded. Electronically Signed   By: Nelson Chimes M.D.   On: 11/26/2018 15:21    Procedures Procedures (including critical care time)  Medications Ordered in ED Medications  dextrose 5 %-0.45 % sodium chloride infusion (has no administration in time range)  insulin regular bolus via infusion 0-10 Units (has no administration in time range)  insulin regular, human (MYXREDLIN) 100 units/ 100 mL infusion (3 Units/hr Intravenous New Bag/Given 11/26/18 1417)  dextrose 50 % solution 25 mL (has no administration in time range)  0.9 %  sodium  chloride infusion (has no administration in time range)  0.9 %  sodium chloride infusion (has no administration in time range)  potassium chloride 10 mEq in 100 mL IVPB (has no administration in time range)  dextrose 5 %-0.45 % sodium chloride infusion (has no administration in time range)  sodium chloride 0.9 % bolus 500 mL (0 mLs Intravenous Stopped 11/26/18 1418)     Initial Impression / Assessment and Plan / ED Course  I have reviewed the triage vital signs and the nursing notes.  Pertinent labs & imaging results that were available during my care of the patient were reviewed by me and considered in my medical decision making (see chart for details).      Patient will be admitted to hospital for ongoing nausea vomiting.  Signs of dehydration with anion gap of 22 and bicarb of 19.  IV fluids now.  Given questionable dilated loops of bowel on plain film she will undergo CT imaging of her abdomen pelvis at this  time.  Patient to be admitted to the [facility] hospitalist service.    Final Clinical Impressions(s) / ED Diagnoses   Final diagnoses:  Nausea and vomiting, intractability of vomiting not specified, unspecified vomiting type    ED Discharge Orders    None       Jola Schmidt, MD 11/26/18 1538

## 2018-11-27 ENCOUNTER — Encounter: Payer: Self-pay | Admitting: *Deleted

## 2018-11-27 DIAGNOSIS — E1110 Type 2 diabetes mellitus with ketoacidosis without coma: Secondary | ICD-10-CM | POA: Diagnosis not present

## 2018-11-27 LAB — GLUCOSE, CAPILLARY
GLUCOSE-CAPILLARY: 146 mg/dL — AB (ref 70–99)
Glucose-Capillary: 127 mg/dL — ABNORMAL HIGH (ref 70–99)
Glucose-Capillary: 133 mg/dL — ABNORMAL HIGH (ref 70–99)
Glucose-Capillary: 135 mg/dL — ABNORMAL HIGH (ref 70–99)
Glucose-Capillary: 139 mg/dL — ABNORMAL HIGH (ref 70–99)
Glucose-Capillary: 141 mg/dL — ABNORMAL HIGH (ref 70–99)
Glucose-Capillary: 154 mg/dL — ABNORMAL HIGH (ref 70–99)
Glucose-Capillary: 158 mg/dL — ABNORMAL HIGH (ref 70–99)
Glucose-Capillary: 159 mg/dL — ABNORMAL HIGH (ref 70–99)
Glucose-Capillary: 165 mg/dL — ABNORMAL HIGH (ref 70–99)
Glucose-Capillary: 166 mg/dL — ABNORMAL HIGH (ref 70–99)
Glucose-Capillary: 169 mg/dL — ABNORMAL HIGH (ref 70–99)
Glucose-Capillary: 171 mg/dL — ABNORMAL HIGH (ref 70–99)
Glucose-Capillary: 181 mg/dL — ABNORMAL HIGH (ref 70–99)

## 2018-11-27 LAB — BASIC METABOLIC PANEL
Anion gap: 10 (ref 5–15)
BUN: 8 mg/dL (ref 6–20)
CALCIUM: 7.9 mg/dL — AB (ref 8.9–10.3)
CO2: 22 mmol/L (ref 22–32)
Chloride: 103 mmol/L (ref 98–111)
Creatinine, Ser: 0.5 mg/dL (ref 0.44–1.00)
GFR calc Af Amer: >60 mL/min (ref >60)
GFR calc non Af Amer: >60 mL/min (ref >60)
Glucose, Bld: 146 mg/dL — ABNORMAL HIGH (ref 70–99)
Potassium: 3.2 mmol/L — ABNORMAL LOW (ref 3.5–5.1)
Sodium: 135 mmol/L (ref 135–145)

## 2018-11-27 MED ORDER — PROCHLORPERAZINE EDISYLATE 10 MG/2ML IJ SOLN
10.0000 mg | Freq: Once | INTRAMUSCULAR | Status: AC
  Administered 2018-11-27: 10 mg via INTRAVENOUS
  Filled 2018-11-27: qty 2

## 2018-11-27 MED ORDER — INSULIN ASPART 100 UNIT/ML ~~LOC~~ SOLN: 0.0000 [IU] | Freq: Every day | SUBCUTANEOUS | Status: DC

## 2018-11-27 MED ORDER — KETOROLAC TROMETHAMINE 30 MG/ML IJ SOLN
30.0000 mg | Freq: Once | INTRAMUSCULAR | Status: AC
  Administered 2018-11-27: 30 mg via INTRAVENOUS
  Filled 2018-11-27: qty 1

## 2018-11-27 MED ORDER — SODIUM CHLORIDE 0.9 % IV SOLN
INTRAVENOUS | Status: DC
  Administered 2018-11-27: 09:00:00 via INTRAVENOUS

## 2018-11-27 MED ORDER — DIPHENHYDRAMINE HCL 50 MG/ML IJ SOLN
25.0000 mg | Freq: Once | INTRAMUSCULAR | Status: AC
  Administered 2018-11-27: 25 mg via INTRAVENOUS
  Filled 2018-11-27: qty 1

## 2018-11-27 MED ORDER — INSULIN ASPART 100 UNIT/ML ~~LOC~~ SOLN
0.0000 [IU] | Freq: Three times a day (TID) | SUBCUTANEOUS | Status: DC
  Administered 2018-11-27 – 2018-11-28 (×2): 2 [IU] via SUBCUTANEOUS

## 2018-11-27 MED ORDER — INSULIN GLARGINE 100 UNIT/ML ~~LOC~~ SOLN
24.0000 [IU] | SUBCUTANEOUS | Status: DC
  Administered 2018-11-27 – 2018-11-28 (×2): 24 [IU] via SUBCUTANEOUS
  Filled 2018-11-27 (×2): qty 0.24

## 2018-11-27 NOTE — Progress Notes (Signed)
Inpatient Diabetes Program Recommendations  AACE/ADA: New Consensus Statement on Inpatient Glycemic Control (2015)  Target Ranges:  Prepandial:   less than 140 mg/dL      Peak postprandial:   less than 180 mg/dL (1-2 hours)      Critically ill patients:  140 - 180 mg/dL   Lab Results  Component Value Date   GLUCAP 165 (H) 11/27/2018   HGBA1C 14.5 (H) 10/19/2018    Review of Glycemic Control  Diabetes history: DM Outpatient Diabetes medications: Tresiba 30 units + Humalog sliding scale Current orders for Inpatient glycemic control: IV insulin with transition orders: Lantus 24 units + Novolog sensitive correction tid + hs 0-5 units  Inpatient Diabetes Program Recommendations:   Patient admitted in DKA.Seen by DM Coordinator on last admission 10/20/18 regarding elevated A1c.  When patient ready to discontinue IV insulin, please give Lantus 2 hrs prior to IV insulin drip discontinued and cover CBG when IV insulin discontinued. Will follow.  Thank you, Nani Gasser. Bonni Neuser, RN, MSN, CDE  Diabetes Coordinator Inpatient Glycemic Control Team Team Pager [phone] (8am-5pm) 11/27/2018 9:13 AM

## 2018-11-27 NOTE — Progress Notes (Signed)
Schorr, NP paged regarding initiation of phase 2 of DKA protocol. Awaiting new orders to be placed. Will continue to closely monitor patient.

## 2018-11-27 NOTE — Progress Notes (Signed)
[facility] Progress Note  Patient: [name] [name] RWE:[id number]   PCP: Jinny Sanders, MD DOB: Jun 25, 1961   DOA: 11/26/2018   DOS: 11/27/2018   Date of Service: the patient was seen and examined on 11/27/2018  Brief hospital course: Pt. with PMH of type 2 diabetes mellitus; admitted on 11/26/2018, presented with complaint of nausea and vomiting, was found to have DKA and gastroparesis flareup. Currently further plan is continue supportive measures.  Subjective: Still feeling nauseous.  No further episodes of vomiting.  No abdominal pain right now.  No fever no chills at home.  Assessment and Plan:   DKA (diabetic ketoacidosis) ([place]) Anion gap closed. Has some still nausea. We will continue with gentle IV hydration. Transition to her home insulin regimen which is 24 units of Tresiba insulin now  Active Problems:   SBO (small bowel obstruction) ([place]) ruled out X-ray on the admission was concerning for small bowel obstruction. CT of the abdomen pelvis was performed which is negative for small bowel obstruction. Patient is actually passing gas and having bowel movements as well. We will advance the diet and monitor.    Hypothyroidism TSH was normal in October. Continue levothyroxine 150 mcg p.o. daily.    Hyperlipidemia Hold atorvastatin until the patient feels better.    Essential hypertension Continue losartan 25 mg p.o. daily. Continue metoprolol XL 25 mg p.o. daily. Monitor BP, HR, renal function and electrolytes.    Atrial fibrillation ([name]) CHA?DS?-VASc Score of at least 4. Not on anticoagulation. Continue aspirin. Continue metoprolol 25 mg p.o. daily.    Gastroparesis due to DM ([name]) Continue metoclopramide.    Chronic systolic CHF (congestive heart failure) ([name]) No signs of decompensation at this time. Continue careful IV hydration. Monitor intake and output.  Diet: Cardiac diet carb modified DVT Prophylaxis: subcutaneous Heparin  Advance  goals of care discussion: Full code  Family Communication: no family was present at bedside, at the time of interview.   Disposition:  Discharge to home likely tomorrow.  Consultants: none Procedures: noen  Scheduled Meds: . aspirin  81 mg Oral Daily  . insulin aspart  0-5 Units Subcutaneous QHS  . insulin aspart  0-9 Units Subcutaneous TID WC  . insulin glargine  24 Units Subcutaneous Q24H  . levothyroxine  150 mcg Oral QAC breakfast  . losartan  25 mg Oral Daily  . metoCLOPramide (REGLAN) injection  10 mg Intravenous Q6H  . metoprolol succinate  25 mg Oral Daily  . nitroGLYCERIN  0.2 mg Transdermal Daily  . pantoprazole  40 mg Oral Daily  . sertraline  50 mg Oral Daily   Continuous Infusions: . sodium chloride 50 mL/hr at 11/27/18 0912  . famotidine (PEPCID) IV 20 mg (11/27/18 1003)   PRN Meds: acetaminophen **OR** acetaminophen, ondansetron **OR** ondansetron (ZOFRAN) IV Antibiotics: Anti-infectives (From admission, onward)   None       Objective: Physical Exam: Vitals:   11/27/18 0500 11/27/18 0600 11/27/18 0807 11/27/18 1747  BP:   125/83 (!) 128/93  Pulse: 81 86 88 82  Resp: 18 19 16    Temp:   98.1 F (36.7 C) 98.2 F (36.8 C)  TempSrc:   Axillary Oral  SpO2: 96% 96% 98% 98%  Weight:      Height:        Intake/Output Summary (Last 24 hours) at 11/27/2018 2134 Last data filed at 11/27/2018 2000 Gross per 24 hour  Intake 526.31 ml  Output 700 ml  Net -173.69 ml   [employer]  11/26/18 1304 11/26/18 1645  Weight: 64 kg 64.3 kg   General: Alert, Awake and Oriented to Time, Place and Person. Appear in mild distress, affect appropriate Eyes: PERRL, Conjunctiva normal ENT: Oral Mucosa clear moist. Neck: no JVD, no Abnormal Mass Or lumps Cardiovascular: S1 and S2 Present, no Murmur, Peripheral Pulses Present Respiratory: normal respiratory effort, Bilateral Air entry equal and Decreased, no use of accessory muscle, Clear to Auscultation, no Crackles,  no wheezes Abdomen: Bowel Sound present, Soft and no tenderness, no hernia Skin: no redness, nono Rash, no induration Extremities: no Pedal edema, no calf tenderness Neurologic: Grossly no focal neuro deficit. Bilaterally Equal motor strength  Data Reviewed: CBC: Recent Labs  Lab 11/26/18 1310  WBC 19.2*  NEUTROABS 17.4*  HGB 15.8*  HCT 47.8*  MCV 83.7  PLT 767   Basic Metabolic Panel: Recent Labs  Lab 11/26/18 1310 11/26/18 1707 11/26/18 2139 11/27/18 0120  NA 136 137 135 135  K 3.9 3.7 3.4* 3.2*  CL 96* 102 103 103  CO2 19* 21* 24 22  GLUCOSE 462* 285* 153* 146*  BUN 10 10 8 8   CREATININE 0.79 0.61 0.45 0.50  CALCIUM 9.2 8.7* 8.2* 7.9*    Liver Function Tests: Recent Labs  Lab 11/26/18 1310  AST 20  ALT 16  ALKPHOS 93  BILITOT 0.8  PROT 7.6  ALBUMIN 4.2   Recent Labs  Lab 11/26/18 1310  LIPASE 71*   No results for input(s): AMMONIA in the last 168 hours. Coagulation Profile: No results for input(s): INR, PROTIME in the last 168 hours. Cardiac Enzymes: No results for input(s): CKTOTAL, CKMB, CKMBINDEX, TROPONINI in the last 168 hours. BNP (last 3 results) No results for input(s): PROBNP in the last 8760 hours. CBG: Recent Labs  Lab 11/27/18 0910 11/27/18 1011 11/27/18 1126 11/27/18 1745 11/27/18 2101  GLUCAP 169* 141* 135* 159* 139*   Studies: No results found.   Time spent: 35 minutes  Author: Berle Mull, MD [facility] Pager: [phone] 11/27/2018 9:34 PM  Between 7PM-7AM, please contact night-coverage at [email or web address], password [facility]

## 2018-11-27 NOTE — Progress Notes (Signed)
Received return call from [place], NP regarding initiation of phase 2 of DKA protocol. Informed that since patient has SBO and is NPO she must stay on insulin gtt for the time being. Instructed RN that if Engineer, maintenance to turn off insulin gtt Schorr, NP is to be notified to increase D5 1/2 NS rate. Will continue to closely monitor.

## 2018-11-28 DIAGNOSIS — E1110 Type 2 diabetes mellitus with ketoacidosis without coma: Secondary | ICD-10-CM | POA: Diagnosis not present

## 2018-11-28 LAB — BASIC METABOLIC PANEL
Anion gap: 9 (ref 5–15)
BUN: <5 mg/dL — ABNORMAL LOW (ref 6–20)
CO2: 23 mmol/L (ref 22–32)
Calcium: 8.2 mg/dL — ABNORMAL LOW (ref 8.9–10.3)
Chloride: 104 mmol/L (ref 98–111)
Creatinine, Ser: 0.48 mg/dL (ref 0.44–1.00)
GFR calc Af Amer: >60 mL/min (ref >60)
GFR calc non Af Amer: >60 mL/min (ref >60)
GLUCOSE: 145 mg/dL — AB (ref 70–99)
Potassium: 3 mmol/L — ABNORMAL LOW (ref 3.5–5.1)
Sodium: 136 mmol/L (ref 135–145)

## 2018-11-28 LAB — GLUCOSE, CAPILLARY: Glucose-Capillary: 155 mg/dL — ABNORMAL HIGH (ref 70–99)

## 2018-11-28 MED ORDER — POTASSIUM CHLORIDE CRYS ER 20 MEQ PO TBCR
40.0000 meq | EXTENDED_RELEASE_TABLET | ORAL | Status: AC
  Administered 2018-11-28 (×2): 40 meq via ORAL
  Filled 2018-11-28 (×2): qty 2

## 2018-11-28 MED ORDER — FAMOTIDINE 20 MG PO TABS: 20.0000 mg | ORAL_TABLET | Freq: Two times a day (BID) | ORAL | Status: DC

## 2018-11-28 NOTE — Discharge Instructions (Signed)
Blood Glucose Monitoring, Adult Monitoring your blood sugar (glucose) is an important part of managing your diabetes (diabetes mellitus). Blood glucose monitoring involves checking your blood glucose as often as directed and keeping a record (log) of your results over time. Checking your blood glucose regularly and keeping a blood glucose log can:  Help you and your health care provider adjust your diabetes management plan as needed, including your medicines or insulin.  Help you understand how food, exercise, illnesses, and medicines affect your blood glucose.  Let you know what your blood glucose is at any time. You can quickly find out if you have low blood glucose (hypoglycemia) or high blood glucose (hyperglycemia). Your health care provider will set individualized treatment goals for you. Your goals will be based on your age, other medical conditions you have, and how you respond to diabetes treatment. Generally, the goal of treatment is to maintain the following blood glucose levels:  Before meals (preprandial): 80-130 mg/dL (4.4-7.2 mmol/L).  After meals (postprandial): below 180 mg/dL (10 mmol/L).  A1c level: less than 7%. Supplies needed:  Blood glucose meter.  Test strips for your meter. Each meter has its own strips. You must use the strips that came with your meter.  A needle to prick your finger (lancet). Do not use a lancet more than one time.  A device that holds the lancet (lancing device).  A journal or log book to write down your results. How to check your blood glucose  1. Wash your hands with soap and water. 2. Prick the side of your finger (not the tip) with the lancet. Use a different finger each time. 3. Gently rub the finger until a small drop of blood appears. 4. Follow instructions that come with your meter for inserting the test strip, applying blood to the strip, and using your blood glucose meter. 5. Write down your result and any notes. Some meters  allow you to use areas of your body other than your finger (alternative sites) to test your blood. The most common alternative sites are:  Forearm.  Thigh.  Palm of the hand. If you think you may have hypoglycemia, or if you have a history of not knowing when your blood glucose is getting low (hypoglycemia unawareness), do not use alternative sites. Use your finger instead. Alternative sites may not be as accurate as the fingers, because blood flow is slower in these areas. This means that the result you get may be delayed, and it may be different from the result that you would get from your finger. Follow these instructions at home: Blood glucose log   Every time you check your blood glucose, write down your result. Also write down any notes about things that may be affecting your blood glucose, such as your diet and exercise for the day. This information can help you and your health care provider: ? Look for patterns in your blood glucose over time. ? Adjust your diabetes management plan as needed.  Check if your meter allows you to download your records to a computer. Most glucose meters store a record of glucose readings in the meter. If you have type 1 diabetes:  Check your blood glucose 2 or more times a day.  Also check your blood glucose: ? Before every insulin injection. ? Before and after exercise. ? Before meals. ? 2 hours after a meal. ? Occasionally between 2:00 a.m. and 3:00 a.m., as directed. ? Before potentially dangerous tasks, like driving or using heavy machinery. ?   At bedtime.  You may need to check your blood glucose more often, up to 6-10 times a day, if you: ? Use an insulin pump. ? Need multiple daily injections (MDI). ? Have diabetes that is not well-controlled. ? Are ill. ? Have a history of severe hypoglycemia. ? Have hypoglycemia unawareness. If you have type 2 diabetes:  If you take insulin or other diabetes medicines, check your blood glucose 2 or  more times a day.  If you are on intensive insulin therapy, check your blood glucose 4 or more times a day. Occasionally, you may also need to check between 2:00 a.m. and 3:00 a.m., as directed.  Also check your blood glucose: ? Before and after exercise. ? Before potentially dangerous tasks, like driving or using heavy machinery.  You may need to check your blood glucose more often if: ? Your medicine is being adjusted. ? Your diabetes is not well-controlled. ? You are ill. General tips  Always keep your supplies with you.  If you have questions or need help, all blood glucose meters have a 24-hour "hotline" phone number that you can call. You may also contact your health care provider.  After you use a few boxes of test strips, adjust (calibrate) your blood glucose meter by following instructions that came with your meter. Contact a health care provider if:  Your blood glucose is at or above 240 mg/dL (13.3 mmol/L) for 2 days in a row.  You have been sick or have had a fever for 2 days or longer, and you are not getting better.  You have any of the following problems for more than 6 hours: ? You cannot eat or drink. ? You have nausea or vomiting. ? You have diarrhea. Get help right away if:  Your blood glucose is lower than 54 mg/dL (3 mmol/L).  You become confused or you have trouble thinking clearly.  You have difficulty breathing.  You have moderate or large ketone levels in your urine. Summary  Monitoring your blood sugar (glucose) is an important part of managing your diabetes (diabetes mellitus).  Blood glucose monitoring involves checking your blood glucose as often as directed and keeping a record (log) of your results over time.  Your health care provider will set individualized treatment goals for you. Your goals will be based on your age, other medical conditions you have, and how you respond to diabetes treatment.  Every time you check your blood glucose,  write down your result. Also write down any notes about things that may be affecting your blood glucose, such as your diet and exercise for the day. This information is not intended to replace advice given to you by your health care provider. Make sure you discuss any questions you have with your health care provider. Document Released: 11/29/2003 Document Revised: 10/07/2017 Document Reviewed: 05/07/2016 Elsevier Interactive Patient Education  2019 Elsevier Inc.  

## 2018-11-28 NOTE — Progress Notes (Signed)
PHARMACIST - PHYSICIAN COMMUNICATION  DR:   Posey Pronto  CONCERNING: IV to Oral Route Change Policy  RECOMMENDATION: This patient is receiving famotidine by the intravenous route.  Based on criteria approved by the Pharmacy and Therapeutics Committee, the intravenous medication(s) is/are being converted to the equivalent oral dose form(s).   DESCRIPTION: These criteria include:  The patient is eating (either orally or via tube) and/or has been taking other orally administered medications for a least 24 hours  The patient has no evidence of active gastrointestinal bleeding or impaired GI absorption (gastrectomy, short bowel, patient on TNA or NPO).  If you have questions about this conversion, please contact the Pharmacy Department  []   [phone] )  [name] [name] []   [phone] )  [facility] [x]   [phone] )  [name] [name] []   [phone] )  [facility] []   [phone] )  [place], [place] 11/28/2018 10:08 AM

## 2018-12-01 NOTE — Telephone Encounter (Signed)
Please sign encounter and close if completed.

## 2018-12-02 ENCOUNTER — Telehealth: Payer: Self-pay

## 2018-12-02 NOTE — Telephone Encounter (Signed)
Left message for patient to call back to get TCM call done and needs an appointment with Dr Diona Browner.

## 2018-12-05 NOTE — Discharge Summary (Signed)
[facility] Discharge Summary   Patient: [name] [name] JXB:[id number]   PCP: Jinny Sanders, MD DOB: November 21, 1961   Date of admission: 11/26/2018   Date of discharge: 11/28/2018     Discharge Diagnoses:  Principal Problem:   DKA (diabetic ketoacidosis) ([place]) Active Problems:   Hypothyroidism   Hyperlipidemia   Essential hypertension   Atrial fibrillation ([name])   Gastroparesis due to DM ([place])   Chronic systolic CHF (congestive heart failure) ([place])   DKA (diabetic ketoacidoses) ([place])   SBO (small bowel obstruction) ([place])   Admitted From: Home Disposition: Home  Recommendations for Outpatient Follow-up:  1. Please follow-up with PCP in 1 week  Follow-up Information    Bedsole, Amy E, MD. Schedule an appointment as soon as possible for a visit in 1 week(s).   Specialty:  Family Medicine Contact information: [address] [place] [address] [phone]          Diet recommendation: Carb modified diet  Activity: The patient is advised to gradually reintroduce usual activities.  Discharge Condition: good  Code Status: full code  History of present illness: As per the H and P dictated on admission, "[name] [name] is a 57 y.o. female with medical history significant of anxiety, B12 deficiency, episodes of chest pain, chronic systolic heart failure, diabetic ketoacidosis episode, GERD, hyperlipidemia, hypertension, hypothyroidism IBS, intermittent vertigo, chronic kidney disease, leg cramping, migraine headaches, osteoporosis, diabetic peripheral neuropathy, type 2 diabetes, on repair umbilical hernia who is coming to the emergency department with complaints of progressively worse abdominal pain associated with 7 episodes of nausea and emesis since about 3 AM this morning.  She denies diarrhea, constipation, melena or hematochezia.  No fever, chills, sore throat, wheezing, hemoptysis or dyspnea.  No chest pain, palpitations, dizziness, diaphoresis, PND,  orthopnea or pitting edema lower extremities.  Denies dysuria, frequency or hematuria.  She denies any diet indiscretions.  She denies polyuria, polydipsia, polyphagia or blurred vision.  ED Course: Initial vital signs temperature 97.7 F, pulse 103, respirations 11, blood pressure 126/94 mmHg and O2 sat 100% on room air.  The patient was started on an insulin infusion and was given IV fluids, antiemetics and analgesics in the emergency department.  Urinalysis shows glucosuria more than 500, proteinuria of 30 and ketones of 80 mg/dL.  There was rare bacteria on microscopic.  White count is 19.2, hemoglobin 15.8 g/dL and platelets 399.  Lipase was 71 units.  CMP shows a chloride 96 and CO2 of 19 mmol/L with anion gap of 21.  Her glucose was 462 mg/dL.  All other CMP values are within normal limits.  Venous gas shows a normal pH both with decreased PCO2 of 35.8 and increased PO2 60.0 mmHg."  Hospital Course:  Summary of her active problems in the hospital is as following. DKA (diabetic ketoacidosis) ([place]) Anion gap closed Transition to her home insulin regimen which is [place] insulin now  Active Problems: SBO (small bowel obstruction) ([place]) ruled out X-ray on the admission was concerning for small bowel obstruction. CT of the abdomen pelvis was performed which is negative for small bowel obstruction. Patient is actually passing gas and having bowel movements as well. Tolerating diet  Hypothyroidism TSH was normal in October. Continue levothyroxine 150 mcg p.o. daily.  Hyperlipidemia Continue atorvastatin until the patient feels better.  Essential hypertension Continue losartan 25 mg p.o. daily. Continue metoprolol XL 25 mg p.o. daily.  Atrial fibrillation ([place]) CHA?DS?-VASc Scoreof at least 4. Not on anticoagulation. Continue aspirin.  Continue metoprolol 25 mg p.o. daily.  Gastroparesis due to DM ([name]) Continue metoclopramide.  Chronic systolic CHF  (congestive heart failure) ([name]) No signs of decompensation at this time.  All other chronic medical condition were stable during the hospitalization.  Patient was ambulatory without any assistance. On the day of the discharge the patient's vitals were stable , and no other acute medical condition were reported by patient. the patient was felt safe to be discharge at home with family.  Consultants: none Procedures: none  DISCHARGE MEDICATION: Allergies as of 11/28/2018      Reactions   Codeine Hives, Anxiety, Other (See Comments)      Medication List    TAKE these medications   aspirin 81 MG chewable tablet Chew 1 tablet (81 mg total) by mouth daily.   atorvastatin 80 MG tablet Commonly known as:  LIPITOR Take 1 tablet (80 mg total) by mouth daily at 6 PM.   GLUCAGON EMERGENCY 1 MG injection Generic drug:  glucagon Inject 1 mg as directed once as needed (blood sugar).   insulin lispro 100 UNIT/ML KiwkPen Commonly known as:  HUMALOG KWIKPEN Inject into skin 3 times a day at meal time according to the following sliding scale: CBG 70-120 - 0 units / CBG 121-150 - 1 unit / CBG 151-200 - 2 units / CBG 201-250 - 3 units / CBG 251-300 - 5 units / CBG 301-350 - 7 units / CBG 351-400+ - 9 units  YOU MUST FOLLOW A STRICT DIABETIC DIET   levothyroxine 150 MCG tablet Commonly known as:  SYNTHROID, LEVOTHROID TAKE 1 TABLET BY MOUTH EVERY DAY What changed:  when to take this   losartan 25 MG tablet Commonly known as:  COZAAR TAKE 1 TABLET BY MOUTH EVERY DAY   metoCLOPramide 5 MG tablet Commonly known as:  REGLAN TAKE 1 TABLET BY MOUTH 4 TIMES A DAY BEFORE MEALS AND AT BEDTIME What changed:  See the new instructions.   metoprolol succinate 25 MG 24 hr tablet Commonly known as:  TOPROL-XL TAKE 1 TABLET BY MOUTH EVERY DAY   nitroGLYCERIN 0.2 mg/hr patch Commonly known as:  NITRODUR - Dosed in mg/24 hr APPLY 1 PATCH TO SKIN EVERY DAY AS NEEDED What changed:  See the new  instructions.   onetouch ultrasoft lancets Use to check blood sugar daily as needed.  Dx: E11.10   ONETOUCH VERIO test strip Generic drug:  glucose blood USE TO CHECK BLOOD SUGAR DAILY AS NEEDED. DX: E11.10   pantoprazole 40 MG tablet Commonly known as:  PROTONIX Take 1 tablet (40 mg total) by mouth daily at 12 noon. What changed:  when to take this   Pen Needles 31G X 8 MM Misc Use to inject insulin 4 times a day.  Dx: E11.10   promethazine 25 MG tablet Commonly known as:  PHENERGAN Take 1 tablet (25 mg total) by mouth every 8 (eight) hours as needed for nausea or vomiting.   sertraline 50 MG tablet Commonly known as:  ZOLOFT TAKE 1 TABLET BY MOUTH EVERY DAY   TRESIBA FLEXTOUCH 100 UNIT/ML Sopn FlexTouch Pen Generic drug:  insulin degludec Inject 0.24 mLs (24 Units total) into the skin at bedtime. On 10/20/18 take at lunch time, on 10/21/18 take at dinner time. On 10/22/18 resume at bedtime regimen. What changed:    how much to take  additional instructions      Allergies  Allergen Reactions  . Codeine Hives, Anxiety and Other (See Comments)   Discharge Instructions  Diet Carb Modified   Complete by:  As directed    Discharge instructions   Complete by:  As directed    It is important that you read following instructions as well as go over your medication list with RN to help you understand your care after this hospitalization.  Discharge Instructions: Please follow-up with PCP in one week  Please request your primary care physician to go over all Hospital Tests and Procedure/Radiological results at the follow up,  Please get all Hospital records sent to your PCP by signing hospital release before you go home.   Do not take more than prescribed Pain, Sleep and Anxiety Medications. You were cared for by a hospitalist during your hospital stay. If you have any questions about your discharge medications or the care you received while you were in the hospital  after you are discharged, you can call the unit you were admitted to and ask to speak with the hospitalist on call if the hospitalist that took care of you is not available.  Once you are discharged, your primary care physician will handle any further medical issues. Please note that NO REFILLS for any discharge medications will be authorized once you are discharged, as it is imperative that you return to your primary care physician (or establish a relationship with a primary care physician if you do not have one) for your aftercare needs so that they can reassess your need for medications and monitor your lab values. You Must read complete instructions/literature along with all the possible adverse reactions/side effects for all the Medicines you take and that have been prescribed to you. Take any new Medicines after you have completely understood and accept all the possible adverse reactions/side effects. Wear Seat belts while driving. If you have smoked or chewed Tobacco in the last 2 yrs please stop smoking and/or stop any Recreational drug use.   Increase activity slowly   Complete by:  As directed      Discharge Exam: Filed Weights   11/26/18 1304 11/26/18 1645  Weight: 64 kg 64.3 kg   Vitals:   11/27/18 2321 11/28/18 0735  BP: 116/68 (!) 129/91  Pulse: 74 81  Resp: 17 12  Temp:  98.4 F (36.9 C)  SpO2: 98% 97%   General: Appear in no distress, no Rash; Oral Mucosa moist. Cardiovascular: S1 and S2 Present, no Murmur, no JVD Respiratory: Bilateral Air entry present and Clear to Auscultation, no Crackles, no wheezes Abdomen: Bowel Sound present, Soft and no tenderness Extremities: no Pedal edema, no calf tenderness Neurology: Grossly no focal neuro deficit.  The results of significant diagnostics from this hospitalization (including imaging, microbiology, ancillary and laboratory) are listed below for reference.    Significant Diagnostic Studies: Ct Abdomen Pelvis W  Contrast  Result Date: 11/26/2018 CLINICAL DATA:  Abdominal pain with nausea and vomiting. Currently admitted for DKA. EXAM: CT ABDOMEN AND PELVIS WITH CONTRAST TECHNIQUE: Multidetector CT imaging of the abdomen and pelvis was performed using the standard protocol following bolus administration of intravenous contrast. CONTRAST:  162mL OMNIPAQUE IOHEXOL 300 MG/ML  SOLN COMPARISON:  CT abdomen pelvis dated November 16, 2018. FINDINGS: Lower chest: No acute abnormality. Hepatobiliary: Unchanged focal fat along the falciform ligament. No other focal liver abnormality. Unchanged gallbladder adenomyomatosis. No gallstones, gallbladder wall thickening, or biliary dilatation. Pancreas: Unremarkable. No pancreatic ductal dilatation or surrounding inflammatory changes. Spleen: Normal in size without focal abnormality. Adrenals/Urinary Tract: Adrenal glands are unremarkable. Kidneys are normal, without renal calculi, focal lesion,  or hydronephrosis. Bladder is unremarkable. Stomach/Bowel: Stomach is within normal limits. Appendix appears normal. No evidence of bowel wall thickening, distention, or inflammatory changes. Mild colonic diverticulosis. Vascular/Lymphatic: No significant vascular findings are present. No enlarged abdominal or pelvic lymph nodes. Reproductive: Status post hysterectomy. No adnexal masses. Other: Unchanged small fat containing umbilical hernia. No free fluid or pneumoperitoneum. Musculoskeletal: No acute or significant osseous findings. IMPRESSION: 1.  No acute intra-abdominal process. Electronically Signed   By: Titus Dubin M.D.   On: 11/26/2018 20:19   Ct Abdomen Pelvis W Contrast  Result Date: 11/16/2018 CLINICAL DATA:  57 year old female with abdominal pain and vomiting. Left lower quadrant pain. EXAM: CT ABDOMEN AND PELVIS WITH CONTRAST TECHNIQUE: Multidetector CT imaging of the abdomen and pelvis was performed using the standard protocol following bolus administration of intravenous  contrast. CONTRAST:  144mL ISOVUE-300 IOPAMIDOL (ISOVUE-300) INJECTION 61% COMPARISON:  CT of the abdomen pelvis dated 09/11/2018 FINDINGS: Lower chest: The visualized lung bases are clear. No intra-abdominal free air or free fluid. Hepatobiliary: Probable mild fatty liver. No intrahepatic biliary ductal dilatation. Focal area of thickening at the gallbladder fundus most consistent with adenomyomatosis, stable since 2014. No pericholecystic fluid. Pancreas: Unremarkable. No pancreatic ductal dilatation or surrounding inflammatory changes. Spleen: Normal in size without focal abnormality. Adrenals/Urinary Tract: Adrenal glands are unremarkable. Kidneys are normal, without renal calculi, focal lesion, or hydronephrosis. Bladder is unremarkable. Stomach/Bowel: There is sigmoid and scattered colonic diverticula without active inflammatory changes. No bowel obstruction or active inflammation. Normal appendix. Vascular/Lymphatic: The abdominal aorta and IVC are unremarkable. No portal venous gas. There is no adenopathy. Reproductive: Hysterectomy. No pelvic mass. Other: Small fat containing umbilical hernia. Musculoskeletal: No acute or significant osseous findings. IMPRESSION: 1. No acute intra-abdominal or pelvic pathology. 2. Colonic diverticulosis. 3. Probable mild fatty liver. 4. Stable focal adenomyomatosis of the gallbladder fundus. Electronically Signed   By: Anner Crete M.D.   On: 11/16/2018 01:07   Dg Abdomen Acute W/chest  Result Date: 11/26/2018 CLINICAL DATA:  Nausea and vomiting beginning today. EXAM: DG ABDOMEN ACUTE W/ 1V CHEST COMPARISON:  CT 10 days ago. FINDINGS: Few mildly dilated loops of small intestine with air-fluid levels. Partial or early small-bowel obstruction is not excluded. No free air. No abnormal calcifications. Chronic scoliosis. One-view chest shows overlying artifact. Heart and mediastinal shadows are normal. The lungs are clear. No free air seen under the diaphragm.  IMPRESSION: Few mildly dilated loops of small intestine with air-fluid levels. Partial or early small bowel obstruction is not excluded. Electronically Signed   By: Nelson Chimes M.D.   On: 11/26/2018 15:21    Microbiology: Recent Results (from the past 240 hour(s))  MRSA PCR Screening     Status: None   Collection Time: 11/26/18  6:37 PM  Result Value Ref Range Status   MRSA by PCR NEGATIVE NEGATIVE Final    Comment:        The GeneXpert MRSA Assay (FDA approved for NASAL specimens only), is one component of a comprehensive MRSA colonization surveillance program. It is not intended to diagnose MRSA infection nor to guide or monitor treatment for MRSA infections. Performed at [facility], [place] [address], [place], [place] [address]      Labs: CBC: No results for input(s): WBC, NEUTROABS, HGB, HCT, MCV, PLT in the last 168 hours. Basic Metabolic Panel: No results for input(s): NA, K, CL, CO2, GLUCOSE, BUN, CREATININE, CALCIUM, MG, PHOS in the last 168 hours. Liver Function Tests: No results for  input(s): AST, ALT, ALKPHOS, BILITOT, PROT, ALBUMIN in the last 168 hours. No results for input(s): LIPASE, AMYLASE in the last 168 hours. No results for input(s): AMMONIA in the last 168 hours. Cardiac Enzymes: No results for input(s): CKTOTAL, CKMB, CKMBINDEX, TROPONINI in the last 168 hours. BNP (last 3 results) No results for input(s): BNP in the last 8760 hours. CBG: No results for input(s): GLUCAP in the last 168 hours. Time spent: 35 minutes  Signed:  Berle Mull  [facility] 11/28/2018 , 5:05 PM

## 2018-12-19 ENCOUNTER — Other Ambulatory Visit: Payer: Self-pay | Admitting: Family Medicine

## 2018-12-19 ENCOUNTER — Ambulatory Visit: Payer: 59 | Admitting: Dietician

## 2018-12-23 NOTE — Telephone Encounter (Signed)
Please check with Dr. Diona Browner and please sign and close the encounter if nothing else needs to be done regarding the message.

## 2018-12-24 ENCOUNTER — Other Ambulatory Visit: Payer: Self-pay | Admitting: Family Medicine

## 2018-12-24 NOTE — Telephone Encounter (Signed)
Last office visit 10/28/2018 for hospital follow up.  Last refilled 10/28/2018 for #30 with no refills.  CPE scheduled for 01/30/2019.

## 2019-01-12 ENCOUNTER — Other Ambulatory Visit: Payer: Self-pay

## 2019-01-12 ENCOUNTER — Encounter (HOSPITAL_COMMUNITY): Payer: Self-pay

## 2019-01-12 ENCOUNTER — Emergency Department (HOSPITAL_COMMUNITY): Payer: 59

## 2019-01-12 ENCOUNTER — Observation Stay (HOSPITAL_COMMUNITY)
Admission: EM | Admit: 2019-01-12 | Discharge: 2019-01-14 | Disposition: A | Discharge status: Home / Self Care | Payer: 59 | Attending: Internal Medicine | Admitting: Internal Medicine

## 2019-01-12 DIAGNOSIS — K3184 Gastroparesis: Secondary | ICD-10-CM | POA: Diagnosis not present

## 2019-01-12 DIAGNOSIS — E1043 Type 1 diabetes mellitus with diabetic autonomic (poly)neuropathy: Secondary | ICD-10-CM | POA: Insufficient documentation

## 2019-01-12 DIAGNOSIS — R110 Nausea: Secondary | ICD-10-CM | POA: Diagnosis not present

## 2019-01-12 DIAGNOSIS — I130 Hypertensive heart and chronic kidney disease with heart failure and stage 1 through stage 4 chronic kidney disease, or unspecified chronic kidney disease: Secondary | ICD-10-CM | POA: Diagnosis not present

## 2019-01-12 DIAGNOSIS — E1110 Type 2 diabetes mellitus with ketoacidosis without coma: Secondary | ICD-10-CM | POA: Diagnosis present

## 2019-01-12 DIAGNOSIS — Z87891 Personal history of nicotine dependence: Secondary | ICD-10-CM | POA: Insufficient documentation

## 2019-01-12 DIAGNOSIS — I10 Essential (primary) hypertension: Secondary | ICD-10-CM | POA: Diagnosis present

## 2019-01-12 DIAGNOSIS — K589 Irritable bowel syndrome without diarrhea: Secondary | ICD-10-CM | POA: Insufficient documentation

## 2019-01-12 DIAGNOSIS — F419 Anxiety disorder, unspecified: Secondary | ICD-10-CM | POA: Insufficient documentation

## 2019-01-12 DIAGNOSIS — E876 Hypokalemia: Secondary | ICD-10-CM | POA: Insufficient documentation

## 2019-01-12 DIAGNOSIS — R9431 Abnormal electrocardiogram [ECG] [EKG]: Secondary | ICD-10-CM | POA: Insufficient documentation

## 2019-01-12 DIAGNOSIS — E1142 Type 2 diabetes mellitus with diabetic polyneuropathy: Secondary | ICD-10-CM

## 2019-01-12 DIAGNOSIS — Z794 Long term (current) use of insulin: Secondary | ICD-10-CM | POA: Insufficient documentation

## 2019-01-12 DIAGNOSIS — I2510 Atherosclerotic heart disease of native coronary artery without angina pectoris: Secondary | ICD-10-CM | POA: Insufficient documentation

## 2019-01-12 DIAGNOSIS — Z885 Allergy status to narcotic agent status: Secondary | ICD-10-CM | POA: Insufficient documentation

## 2019-01-12 DIAGNOSIS — D72829 Elevated white blood cell count, unspecified: Secondary | ICD-10-CM | POA: Insufficient documentation

## 2019-01-12 DIAGNOSIS — R1111 Vomiting without nausea: Secondary | ICD-10-CM | POA: Diagnosis not present

## 2019-01-12 DIAGNOSIS — Z833 Family history of diabetes mellitus: Secondary | ICD-10-CM | POA: Diagnosis not present

## 2019-01-12 DIAGNOSIS — I428 Other cardiomyopathies: Secondary | ICD-10-CM | POA: Diagnosis not present

## 2019-01-12 DIAGNOSIS — Z7982 Long term (current) use of aspirin: Secondary | ICD-10-CM | POA: Insufficient documentation

## 2019-01-12 DIAGNOSIS — E1022 Type 1 diabetes mellitus with diabetic chronic kidney disease: Secondary | ICD-10-CM | POA: Diagnosis not present

## 2019-01-12 DIAGNOSIS — E1165 Type 2 diabetes mellitus with hyperglycemia: Secondary | ICD-10-CM | POA: Diagnosis not present

## 2019-01-12 DIAGNOSIS — E039 Hypothyroidism, unspecified: Secondary | ICD-10-CM | POA: Diagnosis present

## 2019-01-12 DIAGNOSIS — E1143 Type 2 diabetes mellitus with diabetic autonomic (poly)neuropathy: Secondary | ICD-10-CM | POA: Diagnosis present

## 2019-01-12 DIAGNOSIS — N182 Chronic kidney disease, stage 2 (mild): Secondary | ICD-10-CM | POA: Diagnosis present

## 2019-01-12 DIAGNOSIS — E1010 Type 1 diabetes mellitus with ketoacidosis without coma: Secondary | ICD-10-CM | POA: Diagnosis not present

## 2019-01-12 DIAGNOSIS — R000 Tachycardia, unspecified: Secondary | ICD-10-CM | POA: Insufficient documentation

## 2019-01-12 DIAGNOSIS — I5022 Chronic systolic (congestive) heart failure: Secondary | ICD-10-CM | POA: Insufficient documentation

## 2019-01-12 DIAGNOSIS — E785 Hyperlipidemia, unspecified: Secondary | ICD-10-CM | POA: Diagnosis not present

## 2019-01-12 DIAGNOSIS — R1084 Generalized abdominal pain: Secondary | ICD-10-CM | POA: Diagnosis not present

## 2019-01-12 DIAGNOSIS — G629 Polyneuropathy, unspecified: Secondary | ICD-10-CM

## 2019-01-12 DIAGNOSIS — E1042 Type 1 diabetes mellitus with diabetic polyneuropathy: Secondary | ICD-10-CM | POA: Diagnosis not present

## 2019-01-12 DIAGNOSIS — Z8249 Family history of ischemic heart disease and other diseases of the circulatory system: Secondary | ICD-10-CM | POA: Insufficient documentation

## 2019-01-12 DIAGNOSIS — Z79890 Hormone replacement therapy (postmenopausal): Secondary | ICD-10-CM | POA: Insufficient documentation

## 2019-01-12 DIAGNOSIS — E538 Deficiency of other specified B group vitamins: Secondary | ICD-10-CM | POA: Diagnosis not present

## 2019-01-12 DIAGNOSIS — K219 Gastro-esophageal reflux disease without esophagitis: Secondary | ICD-10-CM | POA: Insufficient documentation

## 2019-01-12 DIAGNOSIS — Z79899 Other long term (current) drug therapy: Secondary | ICD-10-CM | POA: Insufficient documentation

## 2019-01-12 DIAGNOSIS — R112 Nausea with vomiting, unspecified: Secondary | ICD-10-CM | POA: Diagnosis not present

## 2019-01-12 LAB — BASIC METABOLIC PANEL
ANION GAP: 18 — AB (ref 5–15)
ANION GAP: 10 (ref 5–15)
BUN: 13 mg/dL (ref 6–20)
BUN: 13 mg/dL (ref 6–20)
CO2: 18 mmol/L — ABNORMAL LOW (ref 22–32)
CO2: 25 mmol/L (ref 22–32)
Calcium: 9.3 mg/dL (ref 8.9–10.3)
Calcium: 9 mg/dL (ref 8.9–10.3)
Chloride: 99 mmol/L (ref 98–111)
Chloride: 103 mmol/L (ref 98–111)
Creatinine, Ser: 0.72 mg/dL (ref 0.44–1.00)
Creatinine, Ser: 0.6 mg/dL (ref 0.44–1.00)
GFR calc Af Amer: >60 mL/min (ref >60)
GFR calc Af Amer: >60 mL/min (ref >60)
GFR calc non Af Amer: >60 mL/min (ref >60)
GFR calc non Af Amer: >60 mL/min (ref >60)
GLUCOSE: 153 mg/dL — AB (ref 70–99)
Glucose, Bld: 454 mg/dL — ABNORMAL HIGH (ref 70–99)
Potassium: 3.5 mmol/L (ref 3.5–5.1)
Potassium: 3.1 mmol/L — ABNORMAL LOW (ref 3.5–5.1)
Sodium: 135 mmol/L (ref 135–145)
Sodium: 138 mmol/L (ref 135–145)

## 2019-01-12 LAB — BLOOD GAS, VENOUS
Acid-base deficit: 1.5 mmol/L (ref 0.0–2.0)
Bicarbonate: 19.2 mmol/L — ABNORMAL LOW (ref 20.0–28.0)
O2 Saturation: 95.7 %
PCO2 VEN: 24.2 mmHg — AB (ref 44.0–60.0)
Patient temperature: 98.6
pH, Ven: 7.512 — ABNORMAL HIGH (ref 7.250–7.430)
pO2, Ven: 82.4 mmHg — ABNORMAL HIGH (ref 32.0–45.0)

## 2019-01-12 LAB — CBG MONITORING, ED
Glucose-Capillary: 476 mg/dL — ABNORMAL HIGH (ref 70–99)
Glucose-Capillary: 288 mg/dL — ABNORMAL HIGH (ref 70–99)
Glucose-Capillary: 262 mg/dL — ABNORMAL HIGH (ref 70–99)
Glucose-Capillary: 189 mg/dL — ABNORMAL HIGH (ref 70–99)
Glucose-Capillary: 137 mg/dL — ABNORMAL HIGH (ref 70–99)
Glucose-Capillary: 160 mg/dL — ABNORMAL HIGH (ref 70–99)
Glucose-Capillary: 161 mg/dL — ABNORMAL HIGH (ref 70–99)

## 2019-01-12 LAB — I-STAT BETA HCG BLOOD, ED (MC, WL, AP ONLY): I-stat hCG, quantitative: <5 m[IU]/mL (ref <5)

## 2019-01-12 LAB — URINALYSIS, ROUTINE W REFLEX MICROSCOPIC
Bacteria, UA: NONE SEEN
Bilirubin Urine: NEGATIVE
Glucose, UA: >=500 mg/dL — AB
Ketones, ur: 80 mg/dL — AB
Leukocytes, UA: NEGATIVE
Nitrite: NEGATIVE
Protein, ur: 100 mg/dL — AB
Specific Gravity, Urine: 1.029 (ref 1.005–1.030)
pH: 5 (ref 5.0–8.0)

## 2019-01-12 LAB — CBC
HEMATOCRIT: 48.1 % — AB (ref 36.0–46.0)
Hemoglobin: 16.1 g/dL — ABNORMAL HIGH (ref 12.0–15.0)
MCH: 27.6 pg (ref 26.0–34.0)
MCHC: 33.5 g/dL (ref 30.0–36.0)
MCV: 82.4 fL (ref 80.0–100.0)
Platelets: 413 10*3/uL — ABNORMAL HIGH (ref 150–400)
RBC: 5.84 MIL/uL — ABNORMAL HIGH (ref 3.87–5.11)
RDW: 13.9 % (ref 11.5–15.5)
WBC: 18.3 10*3/uL — AB (ref 4.0–10.5)
nRBC: 0 % (ref 0.0–0.2)

## 2019-01-12 LAB — I-STAT TROPONIN, ED: Troponin i, poc: 0 ng/mL (ref 0.00–0.08)

## 2019-01-12 LAB — TROPONIN I: Troponin I: <0.03 ng/mL (ref <0.03)

## 2019-01-12 MED ORDER — SERTRALINE HCL 50 MG PO TABS
50.0000 mg | ORAL_TABLET | Freq: Every day | ORAL | Status: DC
  Administered 2019-01-12 – 2019-01-14 (×3): 50 mg via ORAL
  Filled 2019-01-12 (×3): qty 1

## 2019-01-12 MED ORDER — SODIUM CHLORIDE 0.9 % IV SOLN: INTRAVENOUS | Status: AC

## 2019-01-12 MED ORDER — NITROGLYCERIN 0.2 MG/HR TD PT24
0.2000 mg | MEDICATED_PATCH | Freq: Every day | TRANSDERMAL | Status: DC
  Administered 2019-01-12 – 2019-01-14 (×3): 0.2 mg via TRANSDERMAL
  Filled 2019-01-12 (×3): qty 1

## 2019-01-12 MED ORDER — SODIUM CHLORIDE 0.9 % IV SOLN
INTRAVENOUS | Status: DC
  Administered 2019-01-12: 14:00:00 via INTRAVENOUS

## 2019-01-12 MED ORDER — ASPIRIN 81 MG PO CHEW
81.0000 mg | CHEWABLE_TABLET | Freq: Every day | ORAL | Status: DC
  Administered 2019-01-12 – 2019-01-14 (×3): 81 mg via ORAL
  Filled 2019-01-12 (×3): qty 1

## 2019-01-12 MED ORDER — DEXTROSE-NACL 5-0.45 % IV SOLN: INTRAVENOUS | Status: DC

## 2019-01-12 MED ORDER — POTASSIUM CHLORIDE 10 MEQ/100ML IV SOLN
10.0000 meq | INTRAVENOUS | Status: AC
  Administered 2019-01-12 (×2): 10 meq via INTRAVENOUS
  Filled 2019-01-12 (×3): qty 100

## 2019-01-12 MED ORDER — INSULIN GLARGINE 100 UNIT/ML ~~LOC~~ SOLN
12.0000 [IU] | Freq: Every day | SUBCUTANEOUS | Status: DC
  Administered 2019-01-13: 12 [IU] via SUBCUTANEOUS
  Filled 2019-01-12 (×2): qty 0.12

## 2019-01-12 MED ORDER — METOCLOPRAMIDE HCL 5 MG/ML IJ SOLN
5.0000 mg | Freq: Three times a day (TID) | INTRAMUSCULAR | Status: DC
  Administered 2019-01-12 – 2019-01-13 (×2): 5 mg via INTRAVENOUS
  Filled 2019-01-12 (×3): qty 2

## 2019-01-12 MED ORDER — PROMETHAZINE HCL 25 MG/ML IJ SOLN
6.2500 mg | Freq: Four times a day (QID) | INTRAMUSCULAR | Status: DC | PRN
  Administered 2019-01-12: 6.25 mg via INTRAVENOUS
  Filled 2019-01-12: qty 1

## 2019-01-12 MED ORDER — DEXTROSE-NACL 5-0.45 % IV SOLN
INTRAVENOUS | Status: DC
  Administered 2019-01-12: 20:00:00 via INTRAVENOUS

## 2019-01-12 MED ORDER — INSULIN REGULAR(HUMAN) IN NACL 100-0.9 UT/100ML-% IV SOLN
INTRAVENOUS | Status: DC
  Administered 2019-01-12: 3.9 [IU]/h via INTRAVENOUS

## 2019-01-12 MED ORDER — SODIUM CHLORIDE 0.9 % IV SOLN
INTRAVENOUS | Status: DC
  Administered 2019-01-13 – 2019-01-14 (×2): via INTRAVENOUS

## 2019-01-12 MED ORDER — PANTOPRAZOLE SODIUM 40 MG IV SOLR
40.0000 mg | Freq: Two times a day (BID) | INTRAVENOUS | Status: DC
  Administered 2019-01-12 – 2019-01-13 (×2): 40 mg via INTRAVENOUS
  Filled 2019-01-12 (×2): qty 40

## 2019-01-12 MED ORDER — POTASSIUM CHLORIDE 10 MEQ/100ML IV SOLN
10.0000 meq | INTRAVENOUS | Status: AC
  Administered 2019-01-12 (×2): 10 meq via INTRAVENOUS
  Filled 2019-01-12 (×2): qty 100

## 2019-01-12 MED ORDER — HYDRALAZINE HCL 20 MG/ML IJ SOLN: 10.0000 mg | Freq: Four times a day (QID) | INTRAMUSCULAR | Status: DC | PRN

## 2019-01-12 MED ORDER — ATORVASTATIN CALCIUM 40 MG PO TABS
80.0000 mg | ORAL_TABLET | Freq: Every day | ORAL | Status: DC
  Administered 2019-01-13: 80 mg via ORAL
  Filled 2019-01-12: qty 2

## 2019-01-12 MED ORDER — METOPROLOL TARTRATE 5 MG/5ML IV SOLN
5.0000 mg | Freq: Three times a day (TID) | INTRAVENOUS | Status: DC
  Administered 2019-01-12 – 2019-01-13 (×3): 5 mg via INTRAVENOUS
  Filled 2019-01-12 (×3): qty 5

## 2019-01-12 MED ORDER — ENOXAPARIN SODIUM 40 MG/0.4ML ~~LOC~~ SOLN
40.0000 mg | SUBCUTANEOUS | Status: DC
  Administered 2019-01-12 – 2019-01-13 (×2): 40 mg via SUBCUTANEOUS
  Filled 2019-01-12 (×2): qty 0.4

## 2019-01-12 MED ORDER — ONDANSETRON HCL 4 MG/2ML IJ SOLN
4.0000 mg | Freq: Once | INTRAMUSCULAR | Status: AC
  Administered 2019-01-12: 4 mg via INTRAVENOUS
  Filled 2019-01-12: qty 2

## 2019-01-12 MED ORDER — METOPROLOL TARTRATE 5 MG/5ML IV SOLN
5.0000 mg | Freq: Three times a day (TID) | INTRAVENOUS | Status: DC
  Filled 2019-01-12: qty 5

## 2019-01-12 MED ORDER — INSULIN ASPART 100 UNIT/ML ~~LOC~~ SOLN
0.0000 [IU] | SUBCUTANEOUS | Status: DC
  Administered 2019-01-13 (×5): 3 [IU] via SUBCUTANEOUS
  Administered 2019-01-14: 2 [IU] via SUBCUTANEOUS
  Administered 2019-01-14: 3 [IU] via SUBCUTANEOUS
  Filled 2019-01-12: qty 1

## 2019-01-12 MED ORDER — INSULIN REGULAR(HUMAN) IN NACL 100-0.9 UT/100ML-% IV SOLN
INTRAVENOUS | Status: DC
  Administered 2019-01-12: 3.9 [IU]/h via INTRAVENOUS
  Filled 2019-01-12: qty 100

## 2019-01-12 MED ORDER — MAGNESIUM SULFATE 2 GM/50ML IV SOLN
2.0000 g | Freq: Once | INTRAVENOUS | Status: AC
  Administered 2019-01-12: 2 g via INTRAVENOUS
  Filled 2019-01-12: qty 50

## 2019-01-12 MED ORDER — LEVOTHYROXINE SODIUM 150 MCG PO TABS
150.0000 ug | ORAL_TABLET | Freq: Every day | ORAL | Status: DC
  Administered 2019-01-13 – 2019-01-14 (×2): 150 ug via ORAL
  Filled 2019-01-12 (×2): qty 1
  Filled 2019-01-12 (×2): qty 3

## 2019-01-12 MED ORDER — MORPHINE SULFATE (PF) 2 MG/ML IV SOLN
1.0000 mg | INTRAVENOUS | Status: DC | PRN
  Administered 2019-01-12 – 2019-01-13 (×3): 1 mg via INTRAVENOUS
  Filled 2019-01-12 (×3): qty 1

## 2019-01-12 NOTE — ED Notes (Signed)
CBG 161, current infusion rate to be continued per glucostabilizer.

## 2019-01-12 NOTE — ED Notes (Signed)
Pure wick has been placed to suction on 46mmHg. Pt requested pure wick.

## 2019-01-12 NOTE — ED Triage Notes (Signed)
Pt arrived via  EMS from home. Pt is alert and oriented x 4. Pt reports intense nausea and vomiting that started this AM. Pt CBG per EMS that CBG 452. Pt does reports that she took her insulin today, however did not eat d/t n/v.    EMS V/s 160/98, HR 102, RR 20,  98% RA 96.8 temp.

## 2019-01-12 NOTE — ED Notes (Signed)
Bed: QM21 Expected date:  Expected time:  Means of arrival:  Comments: EMS hyperglycemia to room 4/5

## 2019-01-12 NOTE — H&P (Addendum)
History and Physical  [name] [name] QQI:[id number] DOB: 12/08/1961 DOA: 01/12/2019  PCP: Jinny Sanders, MD Patient coming from: Home.   I have personally briefly reviewed patient's old medical records in [place]   Chief Complaint: Abdominal pain, nausea vomiting.  HPI: [name] [name] is a 58 y.o. female type I diabetic, cardiomyopathy ejection fraction 35%, chronic kidney disease stage II, presents complaining of nausea vomiting and abdominal pain that is started early the morning of admission around 4 AM.  She reports that she wake up with abdominal pain, cramping in nature, 8 out of 10 in intensity.  Symptoms resemble when she have DKA.  She had a bowel movement the morning of admission.  She is also complaining of chest pain pressure-like on and off for the last week.  She is currently complaining of chest pain.  She does not have any Nitropaste on.  She usually use Nitropaste chest pain.  She has had bad experience with sublingual nitroglycerin.  Evaluation in the ED: Sodium 135, potassium 3.5, glucose 454, CO2 18, BUN 13 creatinine 0.7, anion gap 18, troponin 0 0.03, white blood cell 18, hemoglobin 16.  UA positive for glucose, positive for ketones white blood cells 0-5.  X-ray no acute abnormality of the lungs.  Review of Systems: All systems reviewed and apart from history of presenting illness, are negative.  Past Medical History:  Diagnosis Date  . Anxiety   . B12 deficiency   . Chest pain 04/05/2017  . CHF (congestive heart failure) ([place])   . Diabetes mellitus type 1, uncontrolled ([place])    "dr. just changed me to type 1, uncontrolled" (11/26/2018)  . DKA (diabetic ketoacidoses) ([place]) 05/25/2018  . GERD (gastroesophageal reflux disease)   . Hyperlipidemia   . Hypertension    [name]  . Hypothyroidism   . IBS (irritable bowel syndrome)   . Intermittent vertigo   . 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 Leg cramping    "@ night"  (09/18/2013)  . Migraine    "once q couple months" (11/26/2018)  . Osteoporosis   . Peripheral neuropathy   . Umbilical hernia    unrepaired (09/18/2013)   Past Surgical History:  Procedure Laterality Date  . CESAREAN SECTION  1989  . LAPAROSCOPIC ASSISTED VAGINAL HYSTERECTOMY  09/23/2012   Procedure: LAPAROSCOPIC ASSISTED VAGINAL HYSTERECTOMY;  Surgeon: Linda Hedges, DO;  Location: [facility];  Service: Gynecology;  Laterality: N/A;  pull Dr Gregor Hams instrument  . LEFT HEART CATH AND CORONARY ANGIOGRAPHY N/A 02/11/2017   Procedure: Left Heart Cath and Coronary Angiography;  Surgeon: Lorretta Harp, MD;  Location: [facility];  Service: Cardiovascular;  Laterality: N/A;  . TUBAL LIGATION Bilateral 1992   Social History:  reports that she quit smoking about 28 years ago. Her smoking use included cigarettes. She has a 1.20 pack-year smoking history. She has never used smokeless tobacco. She reports current drug use. Drug: Marijuana. She reports that she does not drink alcohol.   Allergies  Allergen Reactions  . Codeine Hives, Anxiety and Other (See Comments)    Family History  Problem Relation Age of Onset  . Diabetes Other   . Heart disease Other   . Heart failure Mother 42       Her heart skipped a beat and stopped - per pt  . Diabetes Maternal Grandmother   . Alzheimer's disease Maternal Grandmother   . Coronary artery disease Maternal Grandmother   .  Heart attack Maternal Grandfather   . Heart failure Brother     Prior to Admission medications   Medication Sig Start Date End Date Taking? Authorizing Provider  acetaminophen (TYLENOL) 325 MG tablet Take 650 mg by mouth every 6 (six) hours as needed for mild pain, fever or headache.   Yes [provider]  aspirin 81 MG chewable tablet Chew 1 tablet (81 mg total) by mouth daily. 02/12/17  Yes Kim, James, MD  atorvastatin (LIPITOR) 80 MG tablet Take 1 tablet (80 mg total) by mouth daily at 6 PM. 03/13/18  Yes Bedsole, Amy  E, MD  furosemide (LASIX) 20 MG tablet Take 20 mg by mouth as needed for fluid. 01/03/19  Yes [provider]  GLUCAGON EMERGENCY 1 MG injection Inject 1 mg as directed once as needed (blood sugar).  10/08/18  Yes [provider]  insulin lispro (HUMALOG KWIKPEN) 100 UNIT/ML KiwkPen Inject into skin 3 times a day at meal time according to the following sliding scale: CBG 70-120 - 0 units / CBG 121-150 - 1 unit / CBG 151-200 - 2 units / CBG 201-250 - 3 units / CBG 251-300 - 5 units / CBG 301-350 - 7 units / CBG 351-400+ - 9 units  YOU MUST FOLLOW A STRICT DIABETIC DIET Patient taking differently: Inject 0-9 Units into the skin 3 (three) times daily. Inject into skin 3 times a day at meal time according to the following sliding scale: CBG 70-120 - 0 units / CBG 121-150 - 1 unit / CBG 151-200 - 2 units / CBG 201-250 - 3 units / CBG 251-300 - 5 units / CBG 301-350 - 7 units / CBG 351-400+ - 9 units  YOU MUST FOLLOW A STRICT DIABETIC DIET 07/01/18  Yes Bedsole, Amy E, MD  levothyroxine (SYNTHROID, LEVOTHROID) 150 MCG tablet TAKE 1 TABLET BY MOUTH EVERY DAY Patient taking differently: Take 150 mcg by mouth daily before breakfast.  11/10/18  Yes Bedsole, Amy E, MD  losartan (COZAAR) 25 MG tablet TAKE 1 TABLET BY MOUTH EVERY DAY Patient taking differently: Take 25 mg by mouth daily.  08/27/18  Yes Barrett, Rhonda G, PA-C  metoCLOPramide (REGLAN) 5 MG tablet TAKE 1 TABLET BY MOUTH 4 TIMES A DAY BEFORE MEALS AND AT BEDTIME Patient taking differently: Take 5 mg by mouth 4 (four) times daily -  before meals and at bedtime.  10/21/18  Yes Bedsole, Amy E, MD  metoprolol succinate (TOPROL-XL) 25 MG 24 hr tablet TAKE 1 TABLET BY MOUTH EVERY DAY Patient taking differently: Take 25 mg by mouth daily.  10/06/18  Yes Hochrein, James, MD  nitroGLYCERIN (NITRODUR - DOSED IN MG/24 HR) 0.2 mg/hr patch APPLY 1 PATCH TO SKIN EVERY DAY AS NEEDED Patient taking differently: Place 0.2 mg onto the  skin daily.  08/27/18  Yes Barrett, Rhonda G, PA-C  pantoprazole (PROTONIX) 40 MG tablet Take 1 tablet (40 mg total) by mouth daily at 12 noon. Patient taking differently: Take 40 mg by mouth daily.  01/28/18  Yes Bedsole, Amy E, MD  promethazine (PHENERGAN) 25 MG tablet TAKE 1 TABLET BY MOUTH EVERY 8 HOURS AS NEEDED FOR NAUSEA OR VOMITING. Patient taking differently: Take 25 mg by mouth every 8 (eight) hours as needed for nausea or vomiting.  12/24/18  Yes Bedsole, Amy E, MD  sertraline (ZOLOFT) 50 MG tablet TAKE 1 TABLET BY MOUTH EVERY DAY Patient taking differently: Take 50 mg by mouth daily.  12/19/18  Yes Bedsole, Amy E, MD    TRESIBA FLEXTOUCH 100 UNIT/ML SOPN FlexTouch Pen Inject 0.24 mLs (24 Units total) into the skin at bedtime. On 10/20/18 take at lunch time, on 10/21/18 take at dinner time. On 10/22/18 resume at bedtime regimen. Patient taking differently: Inject 24 Units into the skin at bedtime.  10/20/18  Yes Lavina Hamman, MD  Insulin Pen Needle (PEN NEEDLES) 31G X 8 MM MISC Use to inject insulin 4 times a day.  Dx: E11.10 12/23/17   Jinny Sanders, MD  Lancets (ONETOUCH ULTRASOFT) lancets Use to check blood sugar daily as needed.  Dx: E11.10 12/23/17   Jinny Sanders, MD  ONETOUCH VERIO test strip USE TO CHECK BLOOD SUGAR DAILY AS NEEDED. DX: E11.10 09/21/18   Jinny Sanders, MD   Physical Exam: Vitals:   01/12/19 1515 01/12/19 1530 01/12/19 1600 01/12/19 1717  BP: (!) 157/111 (!) 159/120 (!) 140/91 (!) 93/58  Pulse: 96 95 (!) 104 97  Resp: 15 13 (!) 25 19  Temp:      TempSrc:      SpO2: 100% 99% (!) 87% 96%     General exam: Sick appearing, mild distress due to pain.  Head, eyes and ENT: Nontraumatic and normocephalic. Pupils equally reacting to light and accommodation. Oral mucosa moist.  Neck: Supple. No JVD, carotid bruit or thyromegaly.  Lymphatics: No lymphadenopathy.  Respiratory system: Clear to auscultation. No increased work of breathing.  Cardiovascular system:  S1 and S2 heard, RRR. No JVD, murmurs, gallops, clicks or pedal edema.  Gastrointestinal system: Abdomen is, soft and generalized tenderness. Normal bowel sounds heard. No organomegaly or masses appreciated.  Central nervous system: Alert and oriented. No focal neurological deficits.  Extremities: Symmetric 5 x 5 power. Peripheral pulses symmetrically felt.   Skin: No rashes or acute findings.  Musculoskeletal system: Negative exam.  Psychiatry: Pleasant and cooperative.   Labs on Admission:  Basic Metabolic Panel: Recent Labs  Lab 01/12/19 1316  NA 135  K 3.5  CL 99  CO2 18*  GLUCOSE 454*  BUN 13  CREATININE 0.72  CALCIUM 9.3   Liver Function Tests: No results for input(s): AST, ALT, ALKPHOS, BILITOT, PROT, ALBUMIN in the last 168 hours. No results for input(s): LIPASE, AMYLASE in the last 168 hours. No results for input(s): AMMONIA in the last 168 hours. CBC: Recent Labs  Lab 01/12/19 1316  WBC 18.3*  HGB 16.1*  HCT 48.1*  MCV 82.4  PLT 413*   Cardiac Enzymes: Recent Labs  Lab 01/12/19 1534 01/12/19 1627  TROPONINI <0.03 <0.03    BNP (last 3 results) No results for input(s): PROBNP in the last 8760 hours. CBG: Recent Labs  Lab 01/12/19 1251 01/12/19 1627  GLUCAP 476* 288*    Radiological Exams on Admission: Dg Chest 2 View  Result Date: 01/12/2019 CLINICAL DATA:  Nausea, vomiting EXAM: CHEST - 2 VIEW COMPARISON:  None. FINDINGS: The heart size and mediastinal contours are within normal limits. Both lungs are clear. The visualized skeletal structures are unremarkable. IMPRESSION: No acute abnormality of the lungs. Electronically Signed   By: Eddie Candle M.D.   On: 01/12/2019 16:10    EKG: Independently reviewed.  Cardia borderline T wave abnormalities.  Difficult changes since prior tracing.  Assessment/Plan Principal Problem:   DKA (diabetic ketoacidoses) ([name]) Active Problems:   Hypothyroidism   B12 deficiency   Peripheral neuropathy  ([name])   Essential hypertension   Kidney disease, chronic, stage 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)   Gastroparesis due to DM ([place])  Nonischemic cardiomyopathy ([name])   1-DKA; DM type 1;  Patient presents with nausea, vomiting abdominal pain.  Anion gap elevated at 18, bicarb of 18.  Ketone positive for urine.  CBG 454. Patient will be admitted to the stepdown unit.  We will continue with insulin drip, gentle hydration due to prior nonischemic cardiomyopathy. Be met every 4 hours. Replete potassium as needed. Work-up to rule out infection; this x-ray negative, UA negative for infection. Work-up to rule out ischemia: Troponin x2-.   2-Chest pain; nonischemic cardiomyopathy: Patient reports a history of chest pain.  She uses nitroglycerin paste. Currently complaining of chest pain, Nitropaste will be applied. EKG no significant changes compared to prior tracing. Troponin x2-.. IV metoprolol ordered.  Patient will be able to take her oral metoprolol. Check ECHO.   3-Leukocytosis: Suspect related to DKA, dehydration. Chest x-ray negative for infection.  UA negative for infection. Repeat labs in the morning.  4-Hypothyroidism: Continue with Synthroid, when able to tolerate oral. 5-Hypertension: Resume IV metoprolol. PRN hydralazine.  6-Abdominal pain, nausea vomiting: Suspect related to DKA.  Also prior history of diabetes gastroparesis. Resume Reglan. Monitor QT..  DVT Prophylaxis: Lovenox Code Status: Presumed full code Family Communication: Care discussed with patient.  Disposition Plan: Admit to the stepdown unit for treatment of DKA  Time spent: 75 minutes       A  MD [facility]   01/12/2019, 5:46 PM   

## 2019-01-12 NOTE — ED Provider Notes (Signed)
[facility] DEPT Provider Note   CSN: [id number] Arrival date & time: 01/12/19  1239     History   Chief Complaint Chief Complaint  Patient presents with  . Hyperglycemia    HPI [name] [name] is a 58 y.o. female with PMH/o DM 1, GERD, HLD, HTN, CHF (30-35% EF) who presents for evaluation of nausea, vomiting and generalized abdominal pain that began approximately 4:30 AM.  Patient reports that she was in her normal state of health yesterday prior to onset of symptoms.  She states that since then, she has had numerous episodes of nonbloody, nonbilious emesis.  She reports pain all over to her stomach and described as a "throbbing" type pain.  She states it feels similar to when she was in DKA previously.  She does have a history of diabetes and states that she has been compliant with her insulin.  Her last bowel movement was earlier this morning.  She states that she has had a C-section but no other previous abdominal surgeries.  Patient denies any fevers, cough, congestion, diarrhea, dysuria, hematuria.  The history is provided by the patient.    Past Medical History:  Diagnosis Date  . Anxiety   . B12 deficiency   . Chest pain 04/05/2017  . CHF (congestive heart failure) ([place])   . Diabetes mellitus type 1, uncontrolled ([place])    "dr. just changed me to type 1, uncontrolled" (11/26/2018)  . DKA (diabetic ketoacidoses) ([place]) 05/25/2018  . GERD (gastroesophageal reflux disease)   . Hyperlipidemia   . Hypertension    [name]  . Hypothyroidism   . IBS (irritable bowel syndrome)   . Intermittent vertigo   . 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 Leg cramping    "@ night" (09/18/2013)  . Migraine    "once q couple months" (11/26/2018)  . Osteoporosis   . Peripheral neuropathy   . Umbilical hernia    unrepaired (09/18/2013)    Patient Active Problem List   Diagnosis Date Noted  . DKA (diabetic ketoacidosis) ([employer])  11/26/2018  . SBO (small bowel obstruction) ([place]) 11/26/2018  . DKA (diabetic ketoacidoses) ([place]) 09/23/2018  . Tachycardia 08/04/2018  . MDD (major depressive disorder), single episode, mild ([place]) 07/01/2018  . Colitis 03/07/2018  . Chronic chest pain   . Chronic systolic CHF (congestive heart failure) ([place]) 01/14/2018  . Noncompliance 01/14/2018  . Migraines 12/11/2017  . Chest discomfort 09/18/2017  . DKA, type 2 ([place]) 09/17/2017  . Phrygian cap 06/20/2017  . Leukocytosis   . Lower abdominal pain   . Colitis presumed infectious   . Nonischemic cardiomyopathy ([place]) 03/22/2017  . Hypokalemia 02/15/2017  . NSTEMI (non-ST elevated myocardial infarction) ([place]) 02/11/2017  . H. pylori infection 02/09/2017  . Hypomagnesemia 02/09/2017  . Gastroparesis due to DM ([place]) 01/22/2017  . Poorly controlled type 2 diabetes mellitus with peripheral neuropathy ([place]) 07/17/2016  . Atrial fibrillation ([place]) 02/01/2015  . 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 Hepatic lesion 09/18/2013  . Vitamin D deficiency 05/20/2012  . Essential hypertension 02/01/2011  . Osteoporosis 02/01/2011  . B12 deficiency 04/04/2010  . Peripheral neuropathy ([place]) 05/10/2009  . GERD 04/08/2009  . Generalized anxiety disorder 06/22/2008  . Hypothyroidism 04/29/2008  . Hyperlipidemia 04/29/2008  . Irritable bowel syndrome 04/29/2008    Past Surgical History:  Procedure Laterality Date  . CESAREAN SECTION  1989  . LAPAROSCOPIC ASSISTED VAGINAL HYSTERECTOMY  09/23/2012   Procedure: LAPAROSCOPIC ASSISTED  VAGINAL HYSTERECTOMY;  Surgeon: Linda Hedges, DO;  Location: [facility];  Service: Gynecology;  Laterality: N/A;  pull Dr Gregor Hams instrument  . LEFT HEART CATH AND CORONARY ANGIOGRAPHY N/A 02/11/2017   Procedure: Left Heart Cath and Coronary Angiography;  Surgeon: Lorretta Harp, MD;  Location: [facility];  Service: Cardiovascular;  Laterality: N/A;  . TUBAL LIGATION Bilateral 1992      OB History    Gravida  2   Para  2   Term      Preterm      AB      Living        SAB      TAB      Ectopic      Multiple      Live Births               Home Medications    Prior to Admission medications   Medication Sig Start Date End Date Taking? Authorizing Provider  acetaminophen (TYLENOL) 325 MG tablet Take 650 mg by mouth every 6 (six) hours as needed for mild pain, fever or headache.   Yes [provider]  aspirin 81 MG chewable tablet Chew 1 tablet (81 mg total) by mouth daily. 02/12/17  Yes Jani Gravel, MD  atorvastatin (LIPITOR) 80 MG tablet Take 1 tablet (80 mg total) by mouth daily at 6 PM. 03/13/18  Yes Bedsole, Amy E, MD  furosemide (LASIX) 20 MG tablet Take 20 mg by mouth as needed for fluid. 01/03/19  Yes [provider]  GLUCAGON EMERGENCY 1 MG injection Inject 1 mg as directed once as needed (blood sugar).  10/08/18  Yes [provider]  insulin lispro (HUMALOG KWIKPEN) 100 UNIT/ML KiwkPen Inject into skin 3 times a day at meal time according to the following sliding scale: CBG 70-120 - 0 units / CBG 121-150 - 1 unit / CBG 151-200 - 2 units / CBG 201-250 - 3 units / CBG 251-300 - 5 units / CBG 301-350 - 7 units / CBG 351-400+ - 9 units  YOU MUST FOLLOW A STRICT DIABETIC DIET Patient taking differently: Inject 0-9 Units into the skin 3 (three) times daily. Inject into skin 3 times a day at meal time according to the following sliding scale: CBG 70-120 - 0 units / CBG 121-150 - 1 unit / CBG 151-200 - 2 units / CBG 201-250 - 3 units / CBG 251-300 - 5 units / CBG 301-350 - 7 units / CBG 351-400+ - 9 units  YOU MUST FOLLOW A STRICT DIABETIC DIET 07/01/18  Yes Bedsole, Amy E, MD  levothyroxine (SYNTHROID, LEVOTHROID) 150 MCG tablet TAKE 1 TABLET BY MOUTH EVERY DAY Patient taking differently: Take 150 mcg by mouth daily before breakfast.  11/10/18  Yes Bedsole, Amy E, MD  losartan (COZAAR) 25 MG tablet TAKE 1 TABLET BY  MOUTH EVERY DAY Patient taking differently: Take 25 mg by mouth daily.  08/27/18  Yes Barrett, Evelene Croon, PA-C  metoCLOPramide (REGLAN) 5 MG tablet TAKE 1 TABLET BY MOUTH 4 TIMES A DAY BEFORE MEALS AND AT BEDTIME Patient taking differently: Take 5 mg by mouth 4 (four) times daily -  before meals and at bedtime.  10/21/18  Yes Bedsole, Amy E, MD  metoprolol succinate (TOPROL-XL) 25 MG 24 hr tablet TAKE 1 TABLET BY MOUTH EVERY DAY Patient taking differently: Take 25 mg by mouth daily.  10/06/18  Yes Minus Breeding, MD  nitroGLYCERIN (NITRODUR - DOSED IN MG/24 HR) 0.2  mg/hr patch APPLY 1 PATCH TO SKIN EVERY DAY AS NEEDED Patient taking differently: Place 0.2 mg onto the skin daily.  08/27/18  Yes Barrett, Evelene Croon, PA-C  pantoprazole (PROTONIX) 40 MG tablet Take 1 tablet (40 mg total) by mouth daily at 12 noon. Patient taking differently: Take 40 mg by mouth daily.  01/28/18  Yes Bedsole, Amy E, MD  promethazine (PHENERGAN) 25 MG tablet TAKE 1 TABLET BY MOUTH EVERY 8 HOURS AS NEEDED FOR NAUSEA OR VOMITING. Patient taking differently: Take 25 mg by mouth every 8 (eight) hours as needed for nausea or vomiting.  12/24/18  Yes Bedsole, Amy E, MD  sertraline (ZOLOFT) 50 MG tablet TAKE 1 TABLET BY MOUTH EVERY DAY Patient taking differently: Take 50 mg by mouth daily.  12/19/18  Yes Bedsole, Amy E, MD  TRESIBA FLEXTOUCH 100 UNIT/ML SOPN FlexTouch Pen Inject 0.24 mLs (24 Units total) into the skin at bedtime. On 10/20/18 take at lunch time, on 10/21/18 take at dinner time. On 10/22/18 resume at bedtime regimen. Patient taking differently: Inject 24 Units into the skin at bedtime.  10/20/18  Yes Lavina Hamman, MD  Insulin Pen Needle (PEN NEEDLES) 31G X 8 MM MISC Use to inject insulin 4 times a day.  Dx: E11.10 12/23/17   Jinny Sanders, MD  Lancets (ONETOUCH ULTRASOFT) lancets Use to check blood sugar daily as needed.  Dx: E11.10 12/23/17   Jinny Sanders, MD  ONETOUCH VERIO test strip USE TO CHECK BLOOD SUGAR  DAILY AS NEEDED. DX: E11.10 09/21/18   Jinny Sanders, MD    Family History Family History  Problem Relation Age of Onset  . Diabetes Other   . Heart disease Other   . Heart failure Mother 45       Her heart skipped a beat and stopped - per pt  . Diabetes Maternal Grandmother   . Alzheimer's disease Maternal Grandmother   . Coronary artery disease Maternal Grandmother   . Heart attack Maternal Grandfather   . Heart failure Brother     Social History Social History   Tobacco Use  . Smoking status: Former Smoker    Packs/day: 0.12    Years: 10.00    Pack years: 1.20    Types: Cigarettes    Last attempt to quit: 12/10/1990    Years since quitting: 28.1  . Smokeless tobacco: Never Used  Substance Use Topics  . Alcohol use: Never    Alcohol/week: 0.0 standard drinks    Frequency: Never  . Drug use: Yes    Types: Marijuana    Comment: 11/26/2018 "quit ~ 1 wk ago; was smoking weed qd"     Allergies   Codeine   Review of Systems Review of Systems  Constitutional: Negative for fever.  HENT: Negative for congestion.   Respiratory: Negative for cough and shortness of breath.   Cardiovascular: Negative for chest pain.  Gastrointestinal: Positive for abdominal pain, nausea and vomiting. Negative for blood in stool.  Genitourinary: Negative for dysuria and hematuria.  Neurological: Negative for headaches.  All other systems reviewed and are negative.    Physical Exam Updated Vital Signs BP 97/66 (BP Location: Left Arm)   Pulse 96   Temp 98.7 F (37.1 C) (Oral)   Resp 18   LMP 08/11/2012   SpO2 97%   Physical Exam Vitals signs and nursing note reviewed.  Constitutional:      Appearance: Normal appearance. She is well-developed.     Comments: Appears uncomfortable,  but NAD  HENT:     Head: Normocephalic and atraumatic.  Eyes:     General: Lids are normal.     Conjunctiva/sclera: Conjunctivae normal.     Pupils: Pupils are equal, round, and reactive to light.    Neck:     Musculoskeletal: Full passive range of motion without pain.  Cardiovascular:     Rate and Rhythm: Normal rate and regular rhythm.     Pulses: Normal pulses.     Heart sounds: Normal heart sounds. No murmur. No friction rub. No gallop.   Pulmonary:     Effort: Pulmonary effort is normal.     Breath sounds: Normal breath sounds.  Abdominal:     Palpations: Abdomen is soft. Abdomen is not rigid.     Tenderness: There is generalized abdominal tenderness. There is no guarding. Negative signs include McBurney's sign.     Comments: Abdomen is soft, nondistended.  No rigidity, guarding.  There is generalized tenderness with no focal point.  No tenderness noted to McBurney's point.  Musculoskeletal: Normal range of motion.  Skin:    General: Skin is warm and dry.     Capillary Refill: Capillary refill takes less than 2 seconds.  Neurological:     Mental Status: She is alert and oriented to person, place, and time.  Psychiatric:        Speech: Speech normal.      ED Treatments / Results  Labs (all labs ordered are listed, but only abnormal results are displayed) Labs Reviewed  BASIC METABOLIC PANEL - Abnormal; Notable for the following components:      Result Value   CO2 18 (*)    Glucose, Bld 454 (*)    Anion gap 18 (*)    All other components within normal limits  CBC - Abnormal; Notable for the following components:   WBC 18.3 (*)    RBC 5.84 (*)    Hemoglobin 16.1 (*)    HCT 48.1 (*)    Platelets 413 (*)    All other components within normal limits  URINALYSIS, ROUTINE W REFLEX MICROSCOPIC - Abnormal; Notable for the following components:   Color, Urine STRAW (*)    Glucose, UA >=500 (*)    Hgb urine dipstick SMALL (*)    Ketones, ur 80 (*)    Protein, ur 100 (*)    All other components within normal limits  BLOOD GAS, VENOUS - Abnormal; Notable for the following components:   pH, Ven 7.512 (*)    pCO2, Ven 24.2 (*)    pO2, Ven 82.4 (*)    Bicarbonate 19.2  (*)    All other components within normal limits  CBG MONITORING, ED - Abnormal; Notable for the following components:   Glucose-Capillary 476 (*)    All other components within normal limits  CBG MONITORING, ED - Abnormal; Notable for the following components:   Glucose-Capillary 288 (*)    All other components within normal limits  CBG MONITORING, ED - Abnormal; Notable for the following components:   Glucose-Capillary 262 (*)    All other components within normal limits  CBG MONITORING, ED - Abnormal; Notable for the following components:   Glucose-Capillary 189 (*)    All other components within normal limits  TROPONIN I  TROPONIN I  TROPONIN I  I-STAT BETA HCG BLOOD, ED ([facility], [facility], [facility] ONLY)  I-STAT TROPONIN, ED    EKG EKG Interpretation  Date/Time:  Monday January 12 2019 12:48:41 EST Ventricular Rate:  96 PR Interval:    QRS Duration: 94 QT Interval:  396 QTC Calculation: 501 R Axis:   101 Text Interpretation:  Sinus rhythm Right axis deviation Borderline T wave abnormalities No significant change since last tracing Artifact Abnormal ekg Confirmed by Carmin Muskrat [phone]) on 01/12/2019 3:33:48 PM   Radiology Dg Chest 2 View  Result Date: 01/12/2019 CLINICAL DATA:  Nausea, vomiting EXAM: CHEST - 2 VIEW COMPARISON:  None. FINDINGS: The heart size and mediastinal contours are within normal limits. Both lungs are clear. The visualized skeletal structures are unremarkable. IMPRESSION: No acute abnormality of the lungs. Electronically Signed   By: Eddie Candle M.D.   On: 01/12/2019 16:10    Procedures .Critical Care Performed by: Volanda Napoleon, PA-C Authorized by: Volanda Napoleon, PA-C   Critical care provider statement:    Critical care time (minutes):  35   Critical care was necessary to treat or prevent imminent or life-threatening deterioration of the following conditions:  Endocrine crisis and metabolic crisis   Critical care was time spent personally by me on  the following activities:  Discussions with consultants, evaluation of patient's response to treatment, examination of patient, ordering and performing treatments and interventions, ordering and review of laboratory studies, ordering and review of radiographic studies, pulse oximetry, re-evaluation of patient's condition, obtaining history from patient or surrogate and review of old charts   (including critical care time)  Medications Ordered in ED Medications  insulin regular, human (MYXREDLIN) 100 units/ 100 mL infusion (4 Units/hr Intravenous Rate/Dose Change 01/12/19 1752)  0.9 %  sodium chloride infusion ( Intravenous Stopped 01/12/19 1602)  dextrose 5 %-0.45 % sodium chloride infusion (has no administration in time range)  promethazine (PHENERGAN) injection 6.25 mg (6.25 mg Intravenous Given 01/12/19 1556)  morphine 2 MG/ML injection 1 mg (1 mg Intravenous Given 01/12/19 1557)  nitroGLYCERIN (NITRODUR - Dosed in mg/24 hr) patch 0.2 mg (0.2 mg Transdermal Patch Applied 01/12/19 1629)  pantoprazole (PROTONIX) injection 40 mg (has no administration in time range)  metoCLOPramide (REGLAN) injection 5 mg (0 mg Intravenous Hold 01/12/19 1819)  hydrALAZINE (APRESOLINE) injection 10 mg (has no administration in time range)  metoprolol tartrate (LOPRESSOR) injection 5 mg (5 mg Intravenous Given 01/12/19 1827)  potassium chloride 10 mEq in 100 mL IVPB (has no administration in time range)  magnesium sulfate IVPB 2 g 50 mL (has no administration in time range)  ondansetron (ZOFRAN) injection 4 mg (4 mg Intravenous Given 01/12/19 1408)  potassium chloride 10 mEq in 100 mL IVPB (0 mEq Intravenous Stopped 01/12/19 1720)     Initial Impression / Assessment and Plan / ED Course  I have reviewed the triage vital signs and the nursing notes.  Pertinent labs & imaging results that were available during my care of the patient were reviewed by me and considered in my medical decision making (see chart for details).      58 year old female past medical history is of diabetes, hypertension, hyperlipidemia, CHF who presents for evaluation of abdominal pain, nausea/vomiting that began at 4:30 AM.  No recent fevers, cough, congestion, urinary complaints.  States she has been compliant with her insulin. Patient is afebrile, non-toxic appearing, sitting comfortably on examination table. Vital signs reviewed and stable.  On exam, she has generalized tenderness with no focal point.  Consider infectious process versus DKA.  History/physical exam not concerning for appendicitis, diverticulitis, perforation, obstruction.  Plan to check labs.  POC CBG shows glucose of 476.  CBC shows leukocytosis of  18.3.  Review of records show that is consistent with her history as she normally has some degree of leukocytosis. I-stat beta negative. BMP shows bicarb of 18, anion gap of 18. UA shows ketones. Concern for DKA. Will plan for Insulin drip.   VBG shows pH of 7.512, bicarb of 19.  Discussed patient with Dr. Tyrell Antonio. Will admit.   Final Clinical Impressions(s) / ED Diagnoses   Final diagnoses:  Diabetic ketoacidosis without coma associated with type 1 diabetes mellitus [facility])  Generalized abdominal pain    ED Discharge Orders    None       Desma Mcgregor 01/12/19 1906    Sherwood Gambler, MD 01/13/19 [phone]

## 2019-01-13 ENCOUNTER — Inpatient Hospital Stay (HOSPITAL_COMMUNITY): Payer: 59

## 2019-01-13 ENCOUNTER — Encounter (HOSPITAL_COMMUNITY): Payer: Self-pay | Admitting: Nurse Practitioner

## 2019-01-13 DIAGNOSIS — E1110 Type 2 diabetes mellitus with ketoacidosis without coma: Secondary | ICD-10-CM

## 2019-01-13 DIAGNOSIS — R079 Chest pain, unspecified: Secondary | ICD-10-CM | POA: Diagnosis not present

## 2019-01-13 LAB — BASIC METABOLIC PANEL
ANION GAP: 7 (ref 5–15)
Anion gap: 8 (ref 5–15)
Anion gap: 9 (ref 5–15)
Anion gap: 6 (ref 5–15)
BUN: 13 mg/dL (ref 6–20)
BUN: 12 mg/dL (ref 6–20)
BUN: 13 mg/dL (ref 6–20)
BUN: 9 mg/dL (ref 6–20)
CALCIUM: 8.4 mg/dL — AB (ref 8.9–10.3)
CO2: 21 mmol/L — ABNORMAL LOW (ref 22–32)
CO2: 22 mmol/L (ref 22–32)
CO2: 23 mmol/L (ref 22–32)
CO2: 22 mmol/L (ref 22–32)
CREATININE: 0.51 mg/dL (ref 0.44–1.00)
CREATININE: 0.58 mg/dL (ref 0.44–1.00)
Calcium: 8.5 mg/dL — ABNORMAL LOW (ref 8.9–10.3)
Calcium: 8.3 mg/dL — ABNORMAL LOW (ref 8.9–10.3)
Calcium: 8.2 mg/dL — ABNORMAL LOW (ref 8.9–10.3)
Chloride: 104 mmol/L (ref 98–111)
Chloride: 105 mmol/L (ref 98–111)
Chloride: 104 mmol/L (ref 98–111)
Chloride: 108 mmol/L (ref 98–111)
Creatinine, Ser: 0.51 mg/dL (ref 0.44–1.00)
Creatinine, Ser: 0.62 mg/dL (ref 0.44–1.00)
GFR calc Af Amer: >60 mL/min (ref >60)
GFR calc Af Amer: >60 mL/min (ref >60)
GFR calc Af Amer: >60 mL/min (ref >60)
GFR calc Af Amer: >60 mL/min (ref >60)
GFR calc non Af Amer: >60 mL/min (ref >60)
GFR calc non Af Amer: >60 mL/min (ref >60)
Glucose, Bld: 198 mg/dL — ABNORMAL HIGH (ref 70–99)
Glucose, Bld: 99 mg/dL (ref 70–99)
Glucose, Bld: 210 mg/dL — ABNORMAL HIGH (ref 70–99)
Glucose, Bld: 159 mg/dL — ABNORMAL HIGH (ref 70–99)
Potassium: 3.6 mmol/L (ref 3.5–5.1)
Potassium: 2.9 mmol/L — ABNORMAL LOW (ref 3.5–5.1)
Potassium: 3.9 mmol/L (ref 3.5–5.1)
Potassium: 3.6 mmol/L (ref 3.5–5.1)
Sodium: 133 mmol/L — ABNORMAL LOW (ref 135–145)
Sodium: 136 mmol/L (ref 135–145)
Sodium: 133 mmol/L — ABNORMAL LOW (ref 135–145)
Sodium: 137 mmol/L (ref 135–145)

## 2019-01-13 LAB — CBG MONITORING, ED
Glucose-Capillary: 145 mg/dL — ABNORMAL HIGH (ref 70–99)
Glucose-Capillary: 182 mg/dL — ABNORMAL HIGH (ref 70–99)

## 2019-01-13 LAB — GLUCOSE, CAPILLARY
GLUCOSE-CAPILLARY: 188 mg/dL — AB (ref 70–99)
Glucose-Capillary: 101 mg/dL — ABNORMAL HIGH (ref 70–99)
Glucose-Capillary: 156 mg/dL — ABNORMAL HIGH (ref 70–99)
Glucose-Capillary: 176 mg/dL — ABNORMAL HIGH (ref 70–99)
Glucose-Capillary: 156 mg/dL — ABNORMAL HIGH (ref 70–99)

## 2019-01-13 LAB — TROPONIN I

## 2019-01-13 LAB — HEMOGLOBIN A1C
Hgb A1c MFr Bld: 13.2 % — ABNORMAL HIGH (ref 4.8–5.6)
Mean Plasma Glucose: 332.14 mg/dL

## 2019-01-13 LAB — ECHOCARDIOGRAM COMPLETE
Height: 59.5 in
Weight: 2292.78 oz

## 2019-01-13 MED ORDER — METOCLOPRAMIDE HCL 5 MG PO TABS
5.0000 mg | ORAL_TABLET | Freq: Three times a day (TID) | ORAL | Status: DC
  Administered 2019-01-13 – 2019-01-14 (×5): 5 mg via ORAL
  Filled 2019-01-13 (×5): qty 1

## 2019-01-13 MED ORDER — METOPROLOL SUCCINATE ER 25 MG PO TB24
25.0000 mg | ORAL_TABLET | Freq: Every day | ORAL | Status: DC
  Administered 2019-01-13 – 2019-01-14 (×2): 25 mg via ORAL
  Filled 2019-01-13 (×2): qty 1

## 2019-01-13 MED ORDER — POTASSIUM CHLORIDE 10 MEQ/100ML IV SOLN
INTRAVENOUS | Status: AC
  Filled 2019-01-13: qty 100

## 2019-01-13 MED ORDER — INSULIN GLARGINE 100 UNIT/ML ~~LOC~~ SOLN
24.0000 [IU] | Freq: Every day | SUBCUTANEOUS | Status: DC
  Administered 2019-01-13: 24 [IU] via SUBCUTANEOUS
  Filled 2019-01-13 (×2): qty 0.24

## 2019-01-13 MED ORDER — PANTOPRAZOLE SODIUM 40 MG PO TBEC
40.0000 mg | DELAYED_RELEASE_TABLET | Freq: Every day | ORAL | Status: DC
  Administered 2019-01-14: 40 mg via ORAL
  Filled 2019-01-13: qty 1

## 2019-01-13 MED ORDER — POTASSIUM CHLORIDE 10 MEQ/100ML IV SOLN
10.0000 meq | INTRAVENOUS | Status: AC
  Administered 2019-01-13 (×4): 10 meq via INTRAVENOUS
  Filled 2019-01-13 (×3): qty 100

## 2019-01-13 NOTE — Progress Notes (Signed)
PROGRESS NOTE  [name] [name] HYW:[id number] DOB: 12-22-1960 DOA: 01/12/2019 PCP: Jinny Sanders, MD   LOS: 0 days   Brief narrative: Patient is a 58 y.o. female with PMH of DM2 with neuropathy and nephropathy, CKD, IBS, hypothyroidism, HTN, HLD, GERD, CHF (30-35 % last checked in 09/2017), anxietywho presented to the ED on 2/3 complaining of nausea vomiting and abdominal pain that started around 4 AM in the morning.   In the ED, her blood sugar level was elevated to 454, sodium 135, potassium 3.5, glucose 454, CO2 18, BUN 13 creatinine 0.7, anion gap 18, troponin 0 0.03, white blood cell 18, hemoglobin 16.   UA positive for glucose, positive for ketones white blood cells 0-5.   X-ray no acute abnormality of the lungs. Patient was admitted for DKA.  Subjective: Patient was seen and examined this morning. Middle-aged Caucasian female.  Lying in bed.  Not in distress.  Feels better today.  Assessment/Plan:  Principal Problem:   DKA (diabetic ketoacidoses) ([name]) Active Problems:   Hypothyroidism   B12 deficiency   Peripheral neuropathy ([name])   Essential hypertension   Kidney disease, chronic, stage 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)   Gastroparesis due to DM ([place])   Nonischemic cardiomyopathy ([place])  DKA -likely triggered by acute gastroenteritis.  Managed with insulin drip, IV fluids and electrolyte replacement.  Insulin drip was stopped last night and patient was switched to subcutaneous insulin.  Continue to monitor blood sugar level.  DM2 -patient reports taking 24 units of Tresiba at night and 8 units of Humalog pre-meal.  Patient received 12 units of Lantus last night.  Blood sugar running in 200s this afternoon.  Will increase Lantus to 24 units for tonight.  Continue sliding scale insulin with Accu-Cheks.  Abdominal pain, nausea vomiting - patient seems to have history of gastroparesis as well as irritable bowel syndrome.  Patient may have had a flareup of this.  Or it could be viral  gastroenteritis.  In any case, her symptoms have resolved at this time.  Patient takes Reglan 4 times a day at home.  Continue the same.  Congestive heart failure/nonischemic cardiomyopathy/hypertension - last echocardiogram from 2018 had shown an EF of 30 to 35%.  Repeat echocardiogram today showed improvement in EF to 55 to 60%.  Prior to admission, patient was on Toprol 25 mg daily, Lasix 20 mg daily, losartan 25 mg daily.  Currently on Toprol.  Resume Lasix when blood pressure trends up.  Since patient already has improvement in ejection fraction to normal, patient may not need to continue Lasix anymore.  Coronary artery disease - continue aspirin, statin.  Does not have chest pain at this time.  Leukocytosis: Suspect related to DKA, dehydration. Chest x-ray negative for infection.  UA negative for infection. Repeat labs in the morning.   Hypothyroidism: Continue Synthroid.  Hypokalemia - potassium was as low as 2.9 this morning.  IV replacement given.  Potassium level corrected to 3.9 on repeat check.    GERD - Protonix  Anxiety - Zoloft  DVT Prophylaxis: Lovenox Code Status: full code Family Communication: Care discussed with patient.  Disposition Plan:  Anticipate discharge home tomorrow.  Consultants:  None  Procedures:  None  Antimicrobials:  Anti-infectives (From admission, onward)   None     Infusions: . sodium chloride    . potassium chloride      Scheduled Meds: . aspirin  81 mg Oral Daily  . atorvastatin  80 mg Oral q1800  . enoxaparin (LOVENOX)  injection  40 mg Subcutaneous Q24H  . insulin aspart  0-15 Units Subcutaneous Q4H  . insulin glargine  12 Units Subcutaneous QHS  . levothyroxine  150 mcg Oral QAC breakfast  . metoCLOPramide  5 mg Oral TID AC & HS  . metoprolol succinate  25 mg Oral Daily  . nitroGLYCERIN  0.2 mg Transdermal Daily  . pantoprazole  40 mg Oral Q1200  . sertraline  50 mg Oral Daily    PRN meds: hydrALAZINE, morphine  injection, promethazine   Objective: Vitals:   01/13/19 1014 01/13/19 1246  BP: 121/79 124/77  Pulse: 81 87  Resp:  16  Temp: 97.8 F (36.6 C) 97.8 F (36.6 C)  SpO2: 100% 98%    Intake/Output Summary (Last 24 hours) at 01/13/2019 1432 Last data filed at 01/13/2019 1100 Gross per 24 hour  Intake 1592.25 ml  Output 400 ml  Net 1192.25 ml   02/03 0701 - 02/04 0700 In: 1592.3 [P.O.:120; I.V.:1232.7; IV Piggyback:239.5] Out: -  Total I/O In: -  Out: 400 [Urine:400] Filed Weights   01/13/19 0256  Weight: 65 kg   Body mass index is 28.46 kg/m.   Physical Exam: GENERAL: Pleasant middle-aged Caucasian female.  Not in distress. HENT: No scleral pallor or icterus. Pupils equally reactive to light. Oral mucosa is moist NECK: is supple, no palpable thyroid enlargement. CHEST: Clear to auscultation. No crackles or wheezes.  CVS: S1 and S2 heard, no murmur. Regular rate and rhythm. No pericardial rub. ABDOMEN: Soft, non-tender, bowel sounds are present. No palpable hepato-splenomegaly. EXTREMITIES: No edema. CNS: Alert, awake, oriented x3 SKIN: warm and dry without rashes.  Data Review: I have personally reviewed the laboratory data and studies available.  Recent Labs  Lab 01/12/19 1316  WBC 18.3*  HGB 16.1*  HCT 48.1*  MCV 82.4  PLT 413*   Recent Labs  Lab 01/12/19 1316 01/12/19 1913 01/12/19 2313 01/13/19 0357 01/13/19 1105  NA 135 138 133* 136 133*  K 3.5 3.1* 3.6 2.9* 3.9  CL 99 103 104 105 104  CO2 18* 25 21* 22 23  GLUCOSE 454* 153* 198* 99 210*  BUN 13 13 13 12 13   CREATININE 0.72 0.60 0.51 0.51 0.58  CALCIUM 9.3 9.0 8.5* 8.4* 8.3*   Terrilee Croak, MD  [facility] 01/13/2019

## 2019-01-13 NOTE — Progress Notes (Signed)
Inpatient Diabetes Program Recommendations  AACE/ADA: New Consensus Statement on Inpatient Glycemic Control (2015)  Target Ranges:  Prepandial:   less than 140 mg/dL      Peak postprandial:   less than 180 mg/dL (1-2 hours)      Critically ill patients:  140 - 180 mg/dL   Results for AERIELLE, STOKLOSA (MRN [id number]) as of 01/13/2019 07:38  Ref. Range 01/12/2019 12:51 01/12/2019 16:27 01/12/2019 17:48 01/12/2019 19:03 01/12/2019 20:29 01/12/2019 21:39 01/12/2019 23:07 01/13/2019 00:22 01/13/2019 02:08 01/13/2019 04:08  Glucose-Capillary Latest Ref Range: 70 - 99 mg/dL 476 (H)  IV Insulin Drip started at 2pm 288 (H) 262 (H) 189 (H) 137 (H) 160 (H) 161 (H) 182 (H)  3 units NOVOLOG +  12 units LANTUS 145 (H)  IV Insulin Drip Stopped 101 (H)      Admit DKA  History: Type 1 Diabetes  Home DM Meds: Tresiba 24 units QHS        Humalog 0-9 TID per SSI  Current Orders: Lantus 12 units QHS      Novolog Moderate Correction Scale/ SSI (0-15 units) Q4 hours   ENDO: Dr. Hartford Poli with [facility]--last seen 11/05/2018--Was told to take the following:  Tresiba 30 QHS + Humalog 8 TID + Humalog SSI (1:50)    Well known to the Inpatient Diabetes Team.  10 admissions for DKA and other issues in 2019.  Patient was counseled at length by this Diabetes Coordinator on 10/20/2018 when her A1c was 14.5%.  Current A1c of 13.2% shows little improvement since November.  This is surprising to me since patient has an Musician and has been counseled numerous times about the importance of good glucose control.    --Will follow patient during hospitalization--  Wyn Quaker RN, MSN, CDE Diabetes Coordinator Inpatient Glycemic Control Team Team Pager: [phone] (8a-5p)

## 2019-01-13 NOTE — Progress Notes (Signed)
  Echocardiogram 2D Echocardiogram has been performed.  Jennette Dubin 01/13/2019, 8:54 AM

## 2019-01-13 NOTE — ED Notes (Signed)
IV insulin infusion order now D/C after giving the basal insulin 2 hrs prior in accordance with orders.

## 2019-01-13 NOTE — ED Notes (Signed)
ED TO INPATIENT HANDOFF REPORT  Name/Age/Gender [name] [name] 58 y.o. female  Code Status    Code Status Orders  (From admission, onward)         Start     Ordered   01/12/19 1913  Full code  Continuous     01/12/19 1912        Code Status History    Date Active Date Inactive Code Status Order ID Comments User Context   11/26/2018 1539 11/28/2018 1346 Full Code [id number]  Reubin Milan, MD ED   11/26/2018 1530 11/26/2018 1539 Full Code [id number]  Reubin Milan, MD ED   10/19/2018 1541 10/20/2018 2122 Full Code [id number]  Sid Falcon, MD Inpatient   09/22/2018 1645 09/23/2018 1510 Full Code [id number]  Elodia Florence., MD Inpatient   09/22/2018 1645 09/22/2018 1645 Full Code [id number]  Elodia Florence., MD Inpatient   09/11/2018 1452 09/12/2018 1621 Full Code [id number]  Karmen Bongo, MD ED   08/04/2018 1038 08/05/2018 2012 Full Code [id number]  [name], NP ED   06/27/2018 1633 06/29/2018 1757 Full Code [id number]  Caren Griffins, MD ED   05/26/2018 0242 05/28/2018 2108 Full Code [id number]  [name], MD ED   03/06/2018 2107 03/08/2018 1752 Full Code [id number]  [name], MD ED   02/02/2018 1129 02/04/2018 1854 Full Code [id number]  [name], MD ED   12/11/2017 1232 12/14/2017 1658 Full Code [id number]  [name], PA-C ED   09/19/2017 0746 09/23/2017 1924 Full Code [id number]  [name], PA-C ED   09/17/2017 2203 09/18/2017 1940 Full Code [id number]  [name], MD ED   06/22/2017 1709 06/24/2017 1930 Full Code [id number]  [name], DO Inpatient   06/03/2017 1626 06/05/2017 1955 Full Code [id number]  [name], PA-C ED   04/05/2017 1143 04/08/2017 2117 Full Code [id number]  [name], NP ED   03/22/2017 2120 03/23/2017 1831 Full Code [id number]  [name], MD Inpatient   02/09/2017 0958 02/11/2017 2302 Full Code [id number]  [name], NP ED   07/24/2016 1633 07/26/2016 2113 Full Code  [id number]  [name], MD ED   07/24/2016 1633 07/24/2016 1633 Full Code [id number]  [name], MD ED   10/28/2013 2338 10/29/2013 1749 Full Code [id number]  [name], MD ED   09/18/2013 1649 09/20/2013 1637 Full Code [id number]  [name], MD Inpatient   09/23/2012 1135 09/25/2012 1753 Full Code [id number]  [name], RN Inpatient      Home/SNF/Other Given to floor  Chief Complaint hyperglycemia  Level of Care/Admitting Diagnosis ED Disposition    ED Disposition Condition [facility]: [facility] [[id number]]  Level of Care: Telemetry [5]  Admit to tele based on following criteria: Monitor for Ischemic changes  Diagnosis: DKA (diabetic ketoacidoses) [facility]) [[id number]]  Admitting Physician: Elmarie Shiley [phone]  Attending Physician: Elmarie Shiley [phone]  Estimated length of stay: 3 - 4 days  Certification:: I certify this patient will need inpatient services for at least 2 midnights  PT Class (Do Not Modify): Inpatient [101]  PT Acc Code (Do Not Modify): Private [1]       Medical History Past Medical History:  Diagnosis Date  . Anxiety   . B12 deficiency   . Chest pain 04/05/2017  . CHF (congestive heart failure) ([place])   . Diabetes mellitus type 1,  uncontrolled [facility])    "dr. just changed me to type 1, uncontrolled" (11/26/2018)  . DKA (diabetic ketoacidoses) ([place]) 05/25/2018  . GERD (gastroesophageal reflux disease)   . Hyperlipidemia   . Hypertension    [name]  . Hypothyroidism   . IBS (irritable bowel syndrome)   . Intermittent vertigo   . 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 Leg cramping    "@ night" (09/18/2013)  . Migraine    "once q couple months" (11/26/2018)  . Osteoporosis   . Peripheral neuropathy   . Umbilical hernia    unrepaired (09/18/2013)    Allergies Allergies  Allergen Reactions  . Codeine Hives, Anxiety and Other (See Comments)    IV  Location/Drains/Wounds Patient Lines/Drains/Airways Status   Active Line/Drains/Airways    Name:   Placement date:   Placement time:   Site:   Days:   Peripheral IV 01/12/19 Right Forearm   01/12/19    1314    Forearm   1   Peripheral IV 01/12/19 Right Forearm   01/12/19    1625    Forearm   1   External Urinary Catheter   01/12/19    1256    -   1   Wound / Incision (Open or Dehisced) 12/11/17 Non-pressure wound Rectum non-blanching redness   12/11/17    1817    Rectum   398          Labs/Imaging Results for orders placed or performed during the hospital encounter of 01/12/19 (from the past 48 hour(s))  CBG monitoring, ED     Status: Abnormal   Collection Time: 01/12/19 12:51 PM  Result Value Ref Range   Glucose-Capillary 476 (H) 70 - 99 mg/dL  Urinalysis, Routine w reflex microscopic     Status: Abnormal   Collection Time: 01/12/19  1:14 PM  Result Value Ref Range   Color, Urine STRAW (A) YELLOW   APPearance CLEAR CLEAR   Specific Gravity, Urine 1.029 1.005 - 1.030   pH 5.0 5.0 - 8.0   Glucose, UA >=500 (A) NEGATIVE mg/dL   Hgb urine dipstick SMALL (A) NEGATIVE   Bilirubin Urine NEGATIVE NEGATIVE   Ketones, ur 80 (A) NEGATIVE mg/dL   Protein, ur 100 (A) NEGATIVE mg/dL   Nitrite NEGATIVE NEGATIVE   Leukocytes, UA NEGATIVE NEGATIVE   RBC / HPF 0-5 0 - 5 RBC/hpf   WBC, UA 0-5 0 - 5 WBC/hpf   Bacteria, UA NONE SEEN NONE SEEN   Squamous Epithelial / LPF 0-5 0 - 5    Comment: Performed at [facility], [place] [address], [place], [place] [address]  Basic metabolic panel     Status: Abnormal   Collection Time: 01/12/19  1:16 PM  Result Value Ref Range   Sodium 135 135 - 145 mmol/L   Potassium 3.5 3.5 - 5.1 mmol/L   Chloride 99 98 - 111 mmol/L   CO2 18 (L) 22 - 32 mmol/L   Glucose, Bld 454 (H) 70 - 99 mg/dL   BUN 13 6 - 20 mg/dL   Creatinine, Ser 0.72 0.44 - 1.00 mg/dL   Calcium 9.3 8.9 - 10.3 mg/dL   GFR calc non Af Amer >60 >60 mL/min   GFR calc Af Amer  >60 >60 mL/min   Anion gap 18 (H) 5 - 15    Comment: Performed at [facility], [place] [address], [place], [place] [address]  CBC     Status:  Abnormal   Collection Time: 01/12/19  1:16 PM  Result Value Ref Range   WBC 18.3 (H) 4.0 - 10.5 K/uL   RBC 5.84 (H) 3.87 - 5.11 MIL/uL   Hemoglobin 16.1 (H) 12.0 - 15.0 g/dL   HCT 48.1 (H) 36.0 - 46.0 %   MCV 82.4 80.0 - 100.0 fL   MCH 27.6 26.0 - 34.0 pg   MCHC 33.5 30.0 - 36.0 g/dL   RDW 13.9 11.5 - 15.5 %   Platelets 413 (H) 150 - 400 K/uL   nRBC 0.0 0.0 - 0.2 %    Comment: Performed at [facility], [place] [address], [place], [place] [address]  I-Stat beta hCG blood, ED     Status: None   Collection Time: 01/12/19  1:28 PM  Result Value Ref Range   I-stat hCG, quantitative <5.0 <5 mIU/mL   Comment 3            Comment:   GEST. AGE      CONC.  (mIU/mL)   <=1 WEEK        5 - 50     2 WEEKS       50 - 500     3 WEEKS       100 - 10,000     4 WEEKS     1,000 - 30,000        FEMALE AND NON-PREGNANT FEMALE:     LESS THAN 5 mIU/mL   Blood gas, venous     Status: Abnormal   Collection Time: 01/12/19  2:26 PM  Result Value Ref Range   pH, Ven 7.512 (H) 7.250 - 7.430   pCO2, Ven 24.2 (L) 44.0 - 60.0 mmHg   pO2, Ven 82.4 (H) 32.0 - 45.0 mmHg   Bicarbonate 19.2 (L) 20.0 - 28.0 mmol/L   Acid-base deficit 1.5 0.0 - 2.0 mmol/L   O2 Saturation 95.7 %   Patient temperature 98.6    Collection site VEIN    Drawn by DRAWN BY RN    Sample type VEIN     Comment: Performed at [place] [address], [place], [place] [address]  Troponin I - Now Then Q6H     Status: None   Collection Time: 01/12/19  3:34 PM  Result Value Ref Range   Troponin I <0.03 <0.03 ng/mL    Comment: Performed at [facility], [place] [address] [place], [place] [address]  I-stat troponin, ED     Status: None   Collection Time: 01/12/19  3:39 PM  Result Value Ref Range   Troponin i, poc 0.00 0.00 -  0.08 ng/mL   Comment 3            Comment: Due to the release kinetics of cTnI, a negative result within the first hours of the onset of symptoms does not rule out myocardial infarction with certainty. If myocardial infarction is still suspected, repeat the test at appropriate intervals.   CBG monitoring, ED     Status: Abnormal   Collection Time: 01/12/19  4:27 PM  Result Value Ref Range   Glucose-Capillary 288 (H) 70 - 99 mg/dL  Troponin I - Now Then Q6H     Status: None   Collection Time: 01/12/19  4:27 PM  Result Value Ref Range   Troponin I <0.03 <0.03 ng/mL    Comment: Performed at [facility], [place] [address] [place], [place] [address]  CBG monitoring, ED  Status: Abnormal   Collection Time: 01/12/19  5:48 PM  Result Value Ref Range   Glucose-Capillary 262 (H) 70 - 99 mg/dL  CBG monitoring, ED     Status: Abnormal   Collection Time: 01/12/19  7:03 PM  Result Value Ref Range   Glucose-Capillary 189 (H) 70 - 99 mg/dL  Basic metabolic panel     Status: Abnormal   Collection Time: 01/12/19  7:13 PM  Result Value Ref Range   Sodium 138 135 - 145 mmol/L   Potassium 3.1 (L) 3.5 - 5.1 mmol/L   Chloride 103 98 - 111 mmol/L   CO2 25 22 - 32 mmol/L   Glucose, Bld 153 (H) 70 - 99 mg/dL   BUN 13 6 - 20 mg/dL   Creatinine, Ser 0.60 0.44 - 1.00 mg/dL   Calcium 9.0 8.9 - 10.3 mg/dL   GFR calc non Af Amer >60 >60 mL/min   GFR calc Af Amer >60 >60 mL/min   Anion gap 10 5 - 15    Comment: Performed at [facility], [place] [address], [place],  [place] [address]  Hemoglobin A1c     Status: Abnormal   Collection Time: 01/12/19  7:13 PM  Result Value Ref Range   Hgb A1c MFr Bld 13.2 (H) 4.8 - 5.6 %    Comment: (NOTE) Pre diabetes:          5.7%-6.4% Diabetes:              >6.4% Glycemic control for   <7.0% adults with diabetes    Mean Plasma Glucose 332.14 mg/dL    Comment: Performed at [facility], [place] [address], [place], [place]  [address]  CBG monitoring, ED     Status: Abnormal   Collection Time: 01/12/19  8:29 PM  Result Value Ref Range   Glucose-Capillary 137 (H) 70 - 99 mg/dL  CBG monitoring, ED     Status: Abnormal   Collection Time: 01/12/19  9:39 PM  Result Value Ref Range   Glucose-Capillary 160 (H) 70 - 99 mg/dL  CBG monitoring, ED     Status: Abnormal   Collection Time: 01/12/19 11:07 PM  Result Value Ref Range   Glucose-Capillary 161 (H) 70 - 99 mg/dL   Comment 1 Notify RN    Comment 2 Document in Chart   Basic metabolic panel     Status: Abnormal   Collection Time: 01/12/19 11:13 PM  Result Value Ref Range   Sodium 133 (L) 135 - 145 mmol/L   Potassium 3.6 3.5 - 5.1 mmol/L   Chloride 104 98 - 111 mmol/L   CO2 21 (L) 22 - 32 mmol/L   Glucose, Bld 198 (H) 70 - 99 mg/dL   BUN 13 6 - 20 mg/dL   Creatinine, Ser 0.51 0.44 - 1.00 mg/dL   Calcium 8.5 (L) 8.9 - 10.3 mg/dL   GFR calc non Af Amer >60 >60 mL/min   GFR calc Af Amer >60 >60 mL/min   Anion gap 8 5 - 15    Comment: Performed at [facility], [place] [address], [place], [place] [address]  CBG monitoring, ED     Status: Abnormal   Collection Time: 01/13/19 12:22 AM  Result Value Ref Range   Glucose-Capillary 182 (H) 70 - 99 mg/dL  CBG monitoring, ED     Status: Abnormal   Collection Time: 01/13/19  2:08 AM  Result Value Ref Range   Glucose-Capillary 145 (H) 70 - 99  mg/dL   Dg Chest 2 View  Result Date: 01/12/2019 CLINICAL DATA:  Nausea, vomiting EXAM: CHEST - 2 VIEW COMPARISON:  None. FINDINGS: The heart size and mediastinal contours are within normal limits. Both lungs are clear. The visualized skeletal structures are unremarkable. IMPRESSION: No acute abnormality of the lungs. Electronically Signed   By: Eddie Candle M.D.   On: 01/12/2019 16:10    Pending Labs Unresulted Labs (From admission, onward)    Start     Ordered   01/19/19 0500  Creatinine, serum  (enoxaparin (LOVENOX)    CrCl >/= 30 ml/min)  Weekly,   R     Comments:  while on enoxaparin therapy    01/12/19 1912   01/13/19 5465  Basic metabolic panel  Daily,   R     01/12/19 2342   01/12/19 0354  Basic metabolic panel  STAT Now then every 4 hours ,   STAT    Comments:  Please call with results    01/12/19 1912   01/12/19 1520  Troponin I - Now Then Q6H  Now then every 6 hours,   R     01/12/19 1519          Vitals/Pain Today's Vitals   01/13/19 0030 01/13/19 0114 01/13/19 0130 01/13/19 0200  BP: (!) 83/53 107/77 111/70 (!) 93/59  Pulse: 83 87 86 77  Resp: 18 11 17 16   Temp:      TempSrc:      SpO2: 95% 97% 96% 95%  PainSc:        Isolation Precautions No active isolations  Medications Medications  promethazine (PHENERGAN) injection 6.25 mg (6.25 mg Intravenous Given 01/12/19 1556)  morphine 2 MG/ML injection 1 mg (1 mg Intravenous Given 01/12/19 1557)  nitroGLYCERIN (NITRODUR - Dosed in mg/24 hr) patch 0.2 mg (0.2 mg Transdermal Patch Applied 01/12/19 1629)  aspirin chewable tablet 81 mg (81 mg Oral Given 01/12/19 2010)  atorvastatin (LIPITOR) tablet 80 mg (has no administration in time range)  sertraline (ZOLOFT) tablet 50 mg (50 mg Oral Given 01/12/19 2010)  levothyroxine (SYNTHROID, LEVOTHROID) tablet 150 mcg (has no administration in time range)  0.9 %  sodium chloride infusion ( Intravenous Not Given 01/12/19 1949)  0.9 %  sodium chloride infusion ( Intravenous Not Given 01/12/19 1950)  enoxaparin (LOVENOX) injection 40 mg (40 mg Subcutaneous Given 01/12/19 2036)  pantoprazole (PROTONIX) injection 40 mg (40 mg Intravenous Given 01/12/19 2205)  metoCLOPramide (REGLAN) injection 5 mg (5 mg Intravenous Given 01/12/19 2208)  hydrALAZINE (APRESOLINE) injection 10 mg (has no administration in time range)  metoprolol tartrate (LOPRESSOR) injection 5 mg (5 mg Intravenous Given 01/12/19 2209)  insulin glargine (LANTUS) injection 12 Units (12 Units Subcutaneous Given 01/13/19 0024)  insulin aspart (novoLOG) injection 0-15 Units (3 Units  Subcutaneous Given 01/13/19 0055)  ondansetron (ZOFRAN) injection 4 mg (4 mg Intravenous Given 01/12/19 1408)  potassium chloride 10 mEq in 100 mL IVPB (0 mEq Intravenous Stopped 01/12/19 1720)  potassium chloride 10 mEq in 100 mL IVPB (0 mEq Intravenous Stopped 01/12/19 2344)  magnesium sulfate IVPB 2 g 50 mL (0 g Intravenous Stopped 01/12/19 2122)    Mobility non-ambulatory

## 2019-01-14 DIAGNOSIS — I10 Essential (primary) hypertension: Secondary | ICD-10-CM

## 2019-01-14 DIAGNOSIS — I428 Other cardiomyopathies: Secondary | ICD-10-CM

## 2019-01-14 DIAGNOSIS — E039 Hypothyroidism, unspecified: Secondary | ICD-10-CM

## 2019-01-14 DIAGNOSIS — E1143 Type 2 diabetes mellitus with diabetic autonomic (poly)neuropathy: Secondary | ICD-10-CM

## 2019-01-14 DIAGNOSIS — K3184 Gastroparesis: Secondary | ICD-10-CM

## 2019-01-14 DIAGNOSIS — E1010 Type 1 diabetes mellitus with ketoacidosis without coma: Secondary | ICD-10-CM | POA: Diagnosis not present

## 2019-01-14 LAB — MAGNESIUM: Magnesium: 1.9 mg/dL (ref 1.7–2.4)

## 2019-01-14 LAB — CBC WITH DIFFERENTIAL/PLATELET
Abs Immature Granulocytes: 0.06 10*3/uL (ref 0.00–0.07)
Basophils Absolute: 0 10*3/uL (ref 0.0–0.1)
Basophils Relative: 0 %
EOS ABS: 0.1 10*3/uL (ref 0.0–0.5)
Eosinophils Relative: 1 %
HCT: 43.6 % (ref 36.0–46.0)
Hemoglobin: 14.3 g/dL (ref 12.0–15.0)
Immature Granulocytes: 1 %
Lymphocytes Relative: 34 %
Lymphs Abs: 3.5 10*3/uL (ref 0.7–4.0)
MCH: 28.2 pg (ref 26.0–34.0)
MCHC: 32.8 g/dL (ref 30.0–36.0)
MCV: 86 fL (ref 80.0–100.0)
Monocytes Absolute: 0.7 10*3/uL (ref 0.1–1.0)
Monocytes Relative: 6 %
Neutro Abs: 6 10*3/uL (ref 1.7–7.7)
Neutrophils Relative %: 58 %
Platelets: 277 10*3/uL (ref 150–400)
RBC: 5.07 MIL/uL (ref 3.87–5.11)
RDW: 14.5 % (ref 11.5–15.5)
WBC: 10.4 10*3/uL (ref 4.0–10.5)
nRBC: 0 % (ref 0.0–0.2)

## 2019-01-14 LAB — BASIC METABOLIC PANEL
Anion gap: 7 (ref 5–15)
BUN: 8 mg/dL (ref 6–20)
CO2: 20 mmol/L — ABNORMAL LOW (ref 22–32)
Calcium: 8.1 mg/dL — ABNORMAL LOW (ref 8.9–10.3)
Chloride: 110 mmol/L (ref 98–111)
Creatinine, Ser: 0.55 mg/dL (ref 0.44–1.00)
GFR calc Af Amer: >60 mL/min (ref >60)
GFR calc non Af Amer: >60 mL/min (ref >60)
Glucose, Bld: 148 mg/dL — ABNORMAL HIGH (ref 70–99)
Potassium: 3.9 mmol/L (ref 3.5–5.1)
Sodium: 137 mmol/L (ref 135–145)

## 2019-01-14 LAB — GLUCOSE, CAPILLARY
Glucose-Capillary: 103 mg/dL — ABNORMAL HIGH (ref 70–99)
Glucose-Capillary: 104 mg/dL — ABNORMAL HIGH (ref 70–99)
Glucose-Capillary: 147 mg/dL — ABNORMAL HIGH (ref 70–99)
Glucose-Capillary: 151 mg/dL — ABNORMAL HIGH (ref 70–99)

## 2019-01-14 NOTE — Progress Notes (Signed)
Patient given discharge, follow up, and medication instructions, verbalized understanding, IVs x 2 and telemetry monitor removed, personal belongings with patient, family to transport home

## 2019-01-14 NOTE — Discharge Summary (Signed)
Physician Discharge Summary  [name] [name] CWC:[id number] DOB: 24-Feb-1961 DOA: 01/12/2019  PCP: Jinny Sanders, MD  Admit date: 01/12/2019 Discharge date: 01/14/2019  Time spent: 50 minutes  Recommendations for Outpatient Follow-up:  1. Follow-up with Jinny Sanders, MD in 2 weeks.  On follow-up patient's diabetes will need to be reassessed.   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 Tessie Fass is a 58 y.o. female type I diabetic, cardiomyopathy ejection fraction 35%, chronic kidney disease stage II, presents complaining of nausea vomiting and abdominal pain that is started early the morning of admission around 4 AM.  She reports that she wake up with abdominal pain, cramping in nature, 8 out of 10 in intensity.  Symptoms resemble when she have DKA.  She had a bowel movement the morning of admission.  She is also complaining of chest pain pressure-like on and off for the last week.  She is currently complaining of chest pain.  She does not have any Nitropaste on.  She usually use Nitropaste chest pain.  She has had bad experience with sublingual nitroglycerin.  Evaluation in the ED: Sodium 135, potassium 3.5, glucose 454, CO2 18, BUN 13 creatinine 0.7, anion gap 18, troponin 0 0.03, white blood cell 18, hemoglobin 16.  UA positive for glucose, positive for ketones white blood cells 0-5.  X-ray no acute abnormality of the lungs.  Hospital Course:  DKA - Patient was admitted in DKA.  Patient noted on admission to have an anion gap of 18, positive  ketones and glucose in the urine, bicarb of 18 and a blood glucose level of 454.  Patient was placed on the insulin drip IV fluids, antiemetics, supportive care.  Patient improved clinically.  Blood glucose levels improved and anion gap closed.  Patient was subsequently transitioned to subcutaneous insulin as well as sliding scale insulin.  Subcutaneous insulin was adjusted back to home regimen of 24 units long-acting and patient maintained on sliding scale insulin with better blood glucose control.  It was felt patient's DKA was likely triggered by viral gastroenteritis.  Patient had no further nausea vomiting during the hospitalization and was tolerating a carb modified diet by day of discharge.  Patient will be discharged in stable and improved condition.   DM2 -patient reports taking 24 units of Tresiba at night and 8 units of Humalog pre-meal.  Patient initially was transitioned to 12 units of Lantus after being transitioned off the insulin drip.  Blood glucose levels remained in the 200s and Lantus dose adjusted to home dose of 24 units with better blood glucose control.  By day of discharge a.m. morning glucose was 103.  Patient was also maintained on sliding scale insulin.  Outpatient follow-up with PCP.    Abdominal pain, nausea vomiting -  Patient noted to have a history of gastroparesis as well as irritable bowel syndrome.  Patient had presented with abdominal pain nausea and vomiting felt may have been triggered by possible gastroenteritis.  Abdominal pain nausea and vomiting could is also been secondary to  DKA presentation.  As DKA was treated and blood glucose levels improved patient improved clinically.  Patient was resumed back on home regimen of Reglan AC at bedtime.  Diet was advanced which he tolerated.  Patient had no further nausea vomiting or abdominal pain by day of discharge.  Outpatient follow-up.   Congestive heart failure/nonischemic cardiomyopathy/hypertension - last  echocardiogram from 2018 had shown an EF of 30 to 35%.  Repeat echocardiogram showed improvement in EF to 55 to 60%.  Prior to admission, patient was on Toprol 25 mg daily, Lasix 20 mg daily, losartan 25 mg daily.  During the hospitalization patient was maintained on Toprol.  Patient improved clinically will be discharged back home on her home regimen of cardiac medications.  Coronary artery disease - remained stable during the hospitalization.  Patient remained asymptomatic.  Patient was maintained on home regimen of aspirin and statin.    Leukocytosis:Suspect related to DKA, dehydration. Chest x-ray negative for infection. UA negative for infection.  Leukocytosis had resolved by day of discharge.  Hypothyroidism: Patient was maintained on home regimen of Synthroid.   Hypokalemia - likely secondary to DKA.  Potassium was repleted by day of discharge.  Outpatient follow-up.  GERD - patient maintained on Protonix  Anxiety - remained stable.  Patient maintained on home regimen of Zoloft  Procedures:  None  Consultations:  None  Discharge Exam: Vitals:   01/14/19 0755 01/14/19 1155  BP: 135/90 (!) 153/97  Pulse: 79 77  Resp:  18  Temp: 98.2 F (36.8 C) 98.1 F (36.7 C)  SpO2: 97% 95%    General: NAD Cardiovascular: RRR Respiratory: CTAB  Discharge Instructions   Discharge Instructions    Diet Carb Modified   Complete by:  As directed    Increase activity slowly   Complete by:  As directed      Allergies as of 01/14/2019      Reactions   Codeine Hives, Anxiety, Other (See Comments)      Medication List    TAKE these medications   acetaminophen 325 MG tablet Commonly known as:  TYLENOL Take 650 mg by mouth every 6 (six) hours as needed for mild pain, fever or headache.   aspirin 81 MG chewable tablet Chew 1 tablet (81 mg total) by mouth daily.   atorvastatin 80 MG tablet Commonly known as:  LIPITOR Take 1 tablet (80 mg total) by mouth daily at 6 PM.    furosemide 20 MG tablet Commonly known as:  LASIX Take 20 mg by mouth as needed for fluid.   GLUCAGON EMERGENCY 1 MG injection Generic drug:  glucagon Inject 1 mg as directed once as needed (blood sugar).   insulin lispro 100 UNIT/ML KiwkPen Commonly known as:  HUMALOG KWIKPEN Inject into skin 3 times a day at meal time according to the following sliding scale: CBG 70-120 - 0 units / CBG 121-150 - 1 unit / CBG 151-200 - 2 units / CBG 201-250 - 3 units / CBG 251-300 - 5 units / CBG 301-350 - 7 units / CBG 351-400+ - 9 units  YOU MUST FOLLOW A STRICT DIABETIC DIET What changed:    how much to take  how to take this  when to take this   levothyroxine 150 MCG tablet Commonly known as:  SYNTHROID, LEVOTHROID TAKE 1 TABLET BY MOUTH EVERY DAY What changed:  when to take this   losartan 25 MG tablet Commonly known as:  COZAAR TAKE 1 TABLET BY MOUTH EVERY DAY  metoCLOPramide 5 MG tablet Commonly known as:  REGLAN TAKE 1 TABLET BY MOUTH 4 TIMES A DAY BEFORE MEALS AND AT BEDTIME What changed:  See the new instructions.   metoprolol succinate 25 MG 24 hr tablet Commonly known as:  TOPROL-XL TAKE 1 TABLET BY MOUTH EVERY DAY   nitroGLYCERIN 0.2 mg/hr patch Commonly known as:  NITRODUR - Dosed in mg/24 hr APPLY 1 PATCH TO SKIN EVERY DAY AS NEEDED What changed:  See the new instructions.   onetouch ultrasoft lancets Use to check blood sugar daily as needed.  Dx: E11.10   ONETOUCH VERIO test strip Generic drug:  glucose blood USE TO CHECK BLOOD SUGAR DAILY AS NEEDED. DX: E11.10   pantoprazole 40 MG tablet Commonly known as:  PROTONIX Take 1 tablet (40 mg total) by mouth daily at 12 noon. What changed:  when to take this   Pen Needles 31G X 8 MM Misc Use to inject insulin 4 times a day.  Dx: E11.10   promethazine 25 MG tablet Commonly known as:  PHENERGAN TAKE 1 TABLET BY MOUTH EVERY 8 HOURS AS NEEDED FOR NAUSEA OR VOMITING. What changed:  See the new  instructions.   sertraline 50 MG tablet Commonly known as:  ZOLOFT TAKE 1 TABLET BY MOUTH EVERY DAY   TRESIBA FLEXTOUCH 100 UNIT/ML Sopn FlexTouch Pen Generic drug:  insulin degludec Inject 0.24 mLs (24 Units total) into the skin at bedtime. On 10/20/18 take at lunch time, on 10/21/18 take at dinner time. On 10/22/18 resume at bedtime regimen. What changed:  additional instructions      Allergies  Allergen Reactions  . Codeine Hives, Anxiety and Other (See Comments)   Follow-up Information    Bedsole, Amy E, MD. Schedule an appointment as soon as possible for a visit in 2 week(s).   Specialty:  Family Medicine Contact information: [place] [address] [phone]        Minus Breeding, MD .   Specialty:  Cardiology Contact information: [address] [place] [address] [phone]            The results of significant diagnostics from this hospitalization (including imaging, microbiology, ancillary and laboratory) are listed below for reference.    Significant Diagnostic Studies: Dg Chest 2 View  Result Date: 01/12/2019 CLINICAL DATA:  Nausea, vomiting EXAM: CHEST - 2 VIEW COMPARISON:  None. FINDINGS: The heart size and mediastinal contours are within normal limits. Both lungs are clear. The visualized skeletal structures are unremarkable. IMPRESSION: No acute abnormality of the lungs. Electronically Signed   By: Eddie Candle M.D.   On: 01/12/2019 16:10    Microbiology: No results found for this or any previous visit (from the past 240 hour(s)).   Labs: Basic Metabolic Panel: Recent Labs  Lab 01/12/19 2313 01/13/19 0357 01/13/19 1105 01/13/19 2138 01/14/19 0404  NA 133* 136 133* 137 137  K 3.6 2.9* 3.9 3.6 3.9  CL 104 105 104 108 110  CO2 21* 22 23 22  20*  GLUCOSE 198* 99 210* 159* 148*  BUN 13 12 13 9 8   CREATININE 0.51 0.51 0.58 0.62 0.55  CALCIUM 8.5* 8.4* 8.3* 8.2* 8.1*  MG  --   --   --   --  1.9    Liver Function Tests: No results for input(s): AST, ALT, ALKPHOS, BILITOT, PROT, ALBUMIN in the last 168 hours. No results for input(s): LIPASE, AMYLASE in the last 168 hours. No results for input(s): AMMONIA  in the last 168 hours. CBC: Recent Labs  Lab 01/12/19 1316 01/14/19 0404  WBC 18.3* 10.4  NEUTROABS  --  6.0  HGB 16.1* 14.3  HCT 48.1* 43.6  MCV 82.4 86.0  PLT 413* 277   Cardiac Enzymes: Recent Labs  Lab 01/12/19 1534 01/12/19 1627 01/13/19 0357  TROPONINI <0.03 <0.03 <0.03   BNP: BNP (last 3 results) No results for input(s): BNP in the last 8760 hours.  ProBNP (last 3 results) No results for input(s): PROBNP in the last 8760 hours.  CBG: Recent Labs  Lab 01/13/19 2052 01/14/19 0005 01/14/19 0401 01/14/19 0752 01/14/19 1132  GLUCAP 156* 104* 147* 103* 151*       Signed:  Irine Seal MD.  [facility] 01/14/2019, 12:52 PM

## 2019-01-15 ENCOUNTER — Telehealth: Payer: Self-pay | Admitting: *Deleted

## 2019-01-15 NOTE — Telephone Encounter (Signed)
Lm requesting return call to complete TCM and confirm hosp f/u appt  

## 2019-01-16 NOTE — Telephone Encounter (Signed)
Left message for patient to call back to complete TCM and schedule hospital f/u

## 2019-01-20 ENCOUNTER — Telehealth: Payer: Self-pay | Admitting: Family Medicine

## 2019-01-20 DIAGNOSIS — E538 Deficiency of other specified B group vitamins: Secondary | ICD-10-CM

## 2019-01-20 DIAGNOSIS — E559 Vitamin D deficiency, unspecified: Secondary | ICD-10-CM

## 2019-01-20 DIAGNOSIS — E039 Hypothyroidism, unspecified: Secondary | ICD-10-CM

## 2019-01-20 DIAGNOSIS — E7849 Other hyperlipidemia: Secondary | ICD-10-CM

## 2019-01-20 NOTE — Telephone Encounter (Signed)
-----   Message from Ellamae Sia sent at 01/13/2019  4:00 PM EST ----- Regarding: Lab orders for Thursday, 2.13.20 Patient is scheduled for CPX labs, please order future labs, Thanks , Karna Christmas

## 2019-01-22 ENCOUNTER — Other Ambulatory Visit: Payer: 59

## 2019-01-27 ENCOUNTER — Other Ambulatory Visit (INDEPENDENT_AMBULATORY_CARE_PROVIDER_SITE_OTHER): Payer: 59

## 2019-01-27 DIAGNOSIS — E7849 Other hyperlipidemia: Secondary | ICD-10-CM | POA: Diagnosis not present

## 2019-01-27 DIAGNOSIS — E559 Vitamin D deficiency, unspecified: Secondary | ICD-10-CM | POA: Diagnosis not present

## 2019-01-27 DIAGNOSIS — E538 Deficiency of other specified B group vitamins: Secondary | ICD-10-CM | POA: Diagnosis not present

## 2019-01-27 LAB — VITAMIN D 25 HYDROXY (VIT D DEFICIENCY, FRACTURES): VITD: 28.87 ng/mL — ABNORMAL LOW (ref 30.00–100.00)

## 2019-01-27 LAB — LIPID PANEL
Cholesterol: 197 mg/dL (ref 0–200)
HDL: 43.1 mg/dL (ref >39.00)
LDL Cholesterol: 124 mg/dL — ABNORMAL HIGH (ref 0–99)
NonHDL: 153.62
Total CHOL/HDL Ratio: 5
Triglycerides: 148 mg/dL (ref 0.0–149.0)
VLDL: 29.6 mg/dL (ref 0.0–40.0)

## 2019-01-27 LAB — COMPREHENSIVE METABOLIC PANEL
ALBUMIN: 4.1 g/dL (ref 3.5–5.2)
ALT: 8 U/L (ref 0–35)
AST: 8 U/L (ref 0–37)
Alkaline Phosphatase: 80 U/L (ref 39–117)
BILIRUBIN TOTAL: 0.4 mg/dL (ref 0.2–1.2)
BUN: 11 mg/dL (ref 6–23)
CO2: 26 mEq/L (ref 19–32)
Calcium: 9.6 mg/dL (ref 8.4–10.5)
Chloride: 98 mEq/L (ref 96–112)
Creatinine, Ser: 0.72 mg/dL (ref 0.40–1.20)
GFR: 83.92 mL/min (ref >60.00)
Glucose, Bld: 269 mg/dL — ABNORMAL HIGH (ref 70–99)
Potassium: 3.6 mEq/L (ref 3.5–5.1)
Sodium: 134 mEq/L — ABNORMAL LOW (ref 135–145)
Total Protein: 7.3 g/dL (ref 6.0–8.3)

## 2019-01-27 LAB — VITAMIN B12: Vitamin B-12: 285 pg/mL (ref 211–911)

## 2019-01-30 ENCOUNTER — Encounter: Payer: 59 | Admitting: Family Medicine

## 2019-02-12 ENCOUNTER — Encounter: Payer: 59 | Admitting: Family Medicine

## 2019-02-18 DIAGNOSIS — E785 Hyperlipidemia, unspecified: Secondary | ICD-10-CM | POA: Diagnosis not present

## 2019-02-18 DIAGNOSIS — E1040 Type 1 diabetes mellitus with diabetic neuropathy, unspecified: Secondary | ICD-10-CM | POA: Diagnosis not present

## 2019-02-18 DIAGNOSIS — E039 Hypothyroidism, unspecified: Secondary | ICD-10-CM | POA: Diagnosis not present

## 2019-02-18 DIAGNOSIS — E1065 Type 1 diabetes mellitus with hyperglycemia: Secondary | ICD-10-CM | POA: Diagnosis not present

## 2019-02-18 DIAGNOSIS — E1169 Type 2 diabetes mellitus with other specified complication: Secondary | ICD-10-CM | POA: Diagnosis not present

## 2019-02-18 DIAGNOSIS — E1043 Type 1 diabetes mellitus with diabetic autonomic (poly)neuropathy: Secondary | ICD-10-CM | POA: Diagnosis not present

## 2019-02-27 ENCOUNTER — Encounter: Payer: Self-pay | Admitting: Cardiology

## 2019-03-10 ENCOUNTER — Ambulatory Visit: Payer: 59 | Admitting: Cardiology

## 2019-03-17 ENCOUNTER — Other Ambulatory Visit: Payer: Self-pay | Admitting: Physician Assistant

## 2019-03-25 ENCOUNTER — Other Ambulatory Visit: Payer: Self-pay

## 2019-03-25 ENCOUNTER — Inpatient Hospital Stay (HOSPITAL_COMMUNITY)
Admission: EM | Admit: 2019-03-25 | Discharge: 2019-03-27 | DRG: 638 | Disposition: A | Discharge status: Home / Self Care | Payer: 59 | Attending: Internal Medicine | Admitting: Internal Medicine

## 2019-03-25 ENCOUNTER — Emergency Department (HOSPITAL_COMMUNITY): Payer: 59

## 2019-03-25 ENCOUNTER — Encounter (HOSPITAL_COMMUNITY): Payer: Self-pay

## 2019-03-25 DIAGNOSIS — R1030 Lower abdominal pain, unspecified: Secondary | ICD-10-CM | POA: Diagnosis not present

## 2019-03-25 DIAGNOSIS — I4891 Unspecified atrial fibrillation: Secondary | ICD-10-CM | POA: Diagnosis present

## 2019-03-25 DIAGNOSIS — K529 Noninfective gastroenteritis and colitis, unspecified: Secondary | ICD-10-CM | POA: Diagnosis present

## 2019-03-25 DIAGNOSIS — E1022 Type 1 diabetes mellitus with diabetic chronic kidney disease: Secondary | ICD-10-CM | POA: Diagnosis present

## 2019-03-25 DIAGNOSIS — M810 Age-related osteoporosis without current pathological fracture: Secondary | ICD-10-CM | POA: Diagnosis present

## 2019-03-25 DIAGNOSIS — E1165 Type 2 diabetes mellitus with hyperglycemia: Secondary | ICD-10-CM | POA: Diagnosis not present

## 2019-03-25 DIAGNOSIS — E876 Hypokalemia: Secondary | ICD-10-CM | POA: Diagnosis not present

## 2019-03-25 DIAGNOSIS — I480 Paroxysmal atrial fibrillation: Secondary | ICD-10-CM | POA: Diagnosis present

## 2019-03-25 DIAGNOSIS — R7989 Other specified abnormal findings of blood chemistry: Secondary | ICD-10-CM | POA: Diagnosis not present

## 2019-03-25 DIAGNOSIS — K589 Irritable bowel syndrome without diarrhea: Secondary | ICD-10-CM | POA: Diagnosis present

## 2019-03-25 DIAGNOSIS — R112 Nausea with vomiting, unspecified: Secondary | ICD-10-CM | POA: Diagnosis not present

## 2019-03-25 DIAGNOSIS — F331 Major depressive disorder, recurrent, moderate: Secondary | ICD-10-CM | POA: Diagnosis present

## 2019-03-25 DIAGNOSIS — N182 Chronic kidney disease, stage 2 (mild): Secondary | ICD-10-CM | POA: Diagnosis present

## 2019-03-25 DIAGNOSIS — Z79899 Other long term (current) drug therapy: Secondary | ICD-10-CM

## 2019-03-25 DIAGNOSIS — K2970 Gastritis, unspecified, without bleeding: Secondary | ICD-10-CM | POA: Diagnosis present

## 2019-03-25 DIAGNOSIS — K219 Gastro-esophageal reflux disease without esophagitis: Secondary | ICD-10-CM | POA: Diagnosis present

## 2019-03-25 DIAGNOSIS — E538 Deficiency of other specified B group vitamins: Secondary | ICD-10-CM | POA: Diagnosis present

## 2019-03-25 DIAGNOSIS — R109 Unspecified abdominal pain: Secondary | ICD-10-CM | POA: Diagnosis not present

## 2019-03-25 DIAGNOSIS — Z9114 Patient's other noncompliance with medication regimen: Secondary | ICD-10-CM

## 2019-03-25 DIAGNOSIS — F329 Major depressive disorder, single episode, unspecified: Secondary | ICD-10-CM | POA: Diagnosis present

## 2019-03-25 DIAGNOSIS — F411 Generalized anxiety disorder: Secondary | ICD-10-CM | POA: Diagnosis present

## 2019-03-25 DIAGNOSIS — Z8249 Family history of ischemic heart disease and other diseases of the circulatory system: Secondary | ICD-10-CM

## 2019-03-25 DIAGNOSIS — R079 Chest pain, unspecified: Secondary | ICD-10-CM | POA: Diagnosis not present

## 2019-03-25 DIAGNOSIS — A09 Infectious gastroenteritis and colitis, unspecified: Secondary | ICD-10-CM | POA: Diagnosis present

## 2019-03-25 DIAGNOSIS — K5900 Constipation, unspecified: Secondary | ICD-10-CM

## 2019-03-25 DIAGNOSIS — Z885 Allergy status to narcotic agent status: Secondary | ICD-10-CM

## 2019-03-25 DIAGNOSIS — Z7982 Long term (current) use of aspirin: Secondary | ICD-10-CM

## 2019-03-25 DIAGNOSIS — E1065 Type 1 diabetes mellitus with hyperglycemia: Secondary | ICD-10-CM | POA: Diagnosis present

## 2019-03-25 DIAGNOSIS — Z90710 Acquired absence of both cervix and uterus: Secondary | ICD-10-CM

## 2019-03-25 DIAGNOSIS — I130 Hypertensive heart and chronic kidney disease with heart failure and stage 1 through stage 4 chronic kidney disease, or unspecified chronic kidney disease: Secondary | ICD-10-CM | POA: Diagnosis present

## 2019-03-25 DIAGNOSIS — R1010 Upper abdominal pain, unspecified: Secondary | ICD-10-CM | POA: Diagnosis not present

## 2019-03-25 DIAGNOSIS — E1010 Type 1 diabetes mellitus with ketoacidosis without coma: Secondary | ICD-10-CM | POA: Diagnosis not present

## 2019-03-25 DIAGNOSIS — E1143 Type 2 diabetes mellitus with diabetic autonomic (poly)neuropathy: Secondary | ICD-10-CM | POA: Diagnosis present

## 2019-03-25 DIAGNOSIS — E1142 Type 2 diabetes mellitus with diabetic polyneuropathy: Secondary | ICD-10-CM | POA: Diagnosis present

## 2019-03-25 DIAGNOSIS — Z794 Long term (current) use of insulin: Secondary | ICD-10-CM

## 2019-03-25 DIAGNOSIS — Z79890 Hormone replacement therapy (postmenopausal): Secondary | ICD-10-CM

## 2019-03-25 DIAGNOSIS — Z833 Family history of diabetes mellitus: Secondary | ICD-10-CM

## 2019-03-25 DIAGNOSIS — I5030 Unspecified diastolic (congestive) heart failure: Secondary | ICD-10-CM | POA: Diagnosis present

## 2019-03-25 DIAGNOSIS — E1110 Type 2 diabetes mellitus with ketoacidosis without coma: Secondary | ICD-10-CM | POA: Diagnosis present

## 2019-03-25 DIAGNOSIS — E1149 Type 2 diabetes mellitus with other diabetic neurological complication: Secondary | ICD-10-CM | POA: Diagnosis present

## 2019-03-25 DIAGNOSIS — E1043 Type 1 diabetes mellitus with diabetic autonomic (poly)neuropathy: Secondary | ICD-10-CM | POA: Diagnosis present

## 2019-03-25 DIAGNOSIS — R1084 Generalized abdominal pain: Secondary | ICD-10-CM | POA: Diagnosis not present

## 2019-03-25 DIAGNOSIS — E039 Hypothyroidism, unspecified: Secondary | ICD-10-CM | POA: Diagnosis present

## 2019-03-25 DIAGNOSIS — F320 Major depressive disorder, single episode, mild: Secondary | ICD-10-CM | POA: Diagnosis present

## 2019-03-25 DIAGNOSIS — K3184 Gastroparesis: Secondary | ICD-10-CM | POA: Diagnosis present

## 2019-03-25 DIAGNOSIS — I10 Essential (primary) hypertension: Secondary | ICD-10-CM | POA: Diagnosis present

## 2019-03-25 DIAGNOSIS — G8929 Other chronic pain: Secondary | ICD-10-CM | POA: Diagnosis present

## 2019-03-25 DIAGNOSIS — E1042 Type 1 diabetes mellitus with diabetic polyneuropathy: Secondary | ICD-10-CM | POA: Diagnosis present

## 2019-03-25 DIAGNOSIS — E785 Hyperlipidemia, unspecified: Secondary | ICD-10-CM | POA: Diagnosis present

## 2019-03-25 DIAGNOSIS — R778 Other specified abnormalities of plasma proteins: Secondary | ICD-10-CM

## 2019-03-25 LAB — BASIC METABOLIC PANEL
Anion gap: 16 — ABNORMAL HIGH (ref 5–15)
Anion gap: 9 (ref 5–15)
Anion gap: 9 (ref 5–15)
BUN: 11 mg/dL (ref 6–20)
BUN: 11 mg/dL (ref 6–20)
BUN: 9 mg/dL (ref 6–20)
CO2: 20 mmol/L — ABNORMAL LOW (ref 22–32)
CO2: 20 mmol/L — ABNORMAL LOW (ref 22–32)
CO2: 24 mmol/L (ref 22–32)
Calcium: 9.5 mg/dL (ref 8.9–10.3)
Calcium: 8 mg/dL — ABNORMAL LOW (ref 8.9–10.3)
Calcium: 8.2 mg/dL — ABNORMAL LOW (ref 8.9–10.3)
Chloride: 102 mmol/L (ref 98–111)
Chloride: 110 mmol/L (ref 98–111)
Chloride: 107 mmol/L (ref 98–111)
Creatinine, Ser: 0.7 mg/dL (ref 0.44–1.00)
Creatinine, Ser: 0.49 mg/dL (ref 0.44–1.00)
Creatinine, Ser: 0.48 mg/dL (ref 0.44–1.00)
GFR calc Af Amer: >60 mL/min (ref >60)
GFR calc Af Amer: >60 mL/min (ref >60)
GFR calc Af Amer: >60 mL/min (ref >60)
GFR calc non Af Amer: >60 mL/min (ref >60)
GFR calc non Af Amer: >60 mL/min (ref >60)
GFR calc non Af Amer: >60 mL/min (ref >60)
Glucose, Bld: 307 mg/dL — ABNORMAL HIGH (ref 70–99)
Glucose, Bld: 190 mg/dL — ABNORMAL HIGH (ref 70–99)
Glucose, Bld: 143 mg/dL — ABNORMAL HIGH (ref 70–99)
Potassium: 3.1 mmol/L — ABNORMAL LOW (ref 3.5–5.1)
Potassium: 3.6 mmol/L (ref 3.5–5.1)
Potassium: 3.4 mmol/L — ABNORMAL LOW (ref 3.5–5.1)
Sodium: 138 mmol/L (ref 135–145)
Sodium: 139 mmol/L (ref 135–145)
Sodium: 140 mmol/L (ref 135–145)

## 2019-03-25 LAB — CBC WITH DIFFERENTIAL/PLATELET
Abs Immature Granulocytes: 0.16 10*3/uL — ABNORMAL HIGH (ref 0.00–0.07)
Basophils Absolute: 0 10*3/uL (ref 0.0–0.1)
Basophils Relative: 0 %
Eosinophils Absolute: 0 10*3/uL (ref 0.0–0.5)
Eosinophils Relative: 0 %
HCT: 48.2 % — ABNORMAL HIGH (ref 36.0–46.0)
Hemoglobin: 16.6 g/dL — ABNORMAL HIGH (ref 12.0–15.0)
Immature Granulocytes: 1 %
Lymphocytes Relative: 9 %
Lymphs Abs: 1.8 10*3/uL (ref 0.7–4.0)
MCH: 28 pg (ref 26.0–34.0)
MCHC: 34.4 g/dL (ref 30.0–36.0)
MCV: 81.3 fL (ref 80.0–100.0)
Monocytes Absolute: 0.4 10*3/uL (ref 0.1–1.0)
Monocytes Relative: 2 %
Neutro Abs: 17.4 10*3/uL — ABNORMAL HIGH (ref 1.7–7.7)
Neutrophils Relative %: 88 %
Platelets: 434 10*3/uL — ABNORMAL HIGH (ref 150–400)
RBC: 5.93 MIL/uL — ABNORMAL HIGH (ref 3.87–5.11)
RDW: 14.8 % (ref 11.5–15.5)
WBC: 19.8 10*3/uL — ABNORMAL HIGH (ref 4.0–10.5)
nRBC: 0 % (ref 0.0–0.2)

## 2019-03-25 LAB — POCT I-STAT EG7
Bicarbonate: 24.3 mmol/L (ref 20.0–28.0)
Calcium, Ion: 1.2 mmol/L (ref 1.15–1.40)
HCT: 48 % — ABNORMAL HIGH (ref 36.0–46.0)
Hemoglobin: 16.3 g/dL — ABNORMAL HIGH (ref 12.0–15.0)
O2 Saturation: 92 %
Potassium: 3 mmol/L — ABNORMAL LOW (ref 3.5–5.1)
Sodium: 138 mmol/L (ref 135–145)
TCO2: 25 mmol/L (ref 22–32)
pCO2, Ven: 36.6 mmHg — ABNORMAL LOW (ref 44.0–60.0)
pH, Ven: 7.43 (ref 7.250–7.430)
pO2, Ven: 61 mmHg — ABNORMAL HIGH (ref 32.0–45.0)

## 2019-03-25 LAB — HEMOGLOBIN A1C
Hgb A1c MFr Bld: 11.6 % — ABNORMAL HIGH (ref 4.8–5.6)
Mean Plasma Glucose: 286.22 mg/dL

## 2019-03-25 LAB — CBG MONITORING, ED
Glucose-Capillary: 288 mg/dL — ABNORMAL HIGH (ref 70–99)
Glucose-Capillary: 291 mg/dL — ABNORMAL HIGH (ref 70–99)
Glucose-Capillary: 232 mg/dL — ABNORMAL HIGH (ref 70–99)
Glucose-Capillary: 208 mg/dL — ABNORMAL HIGH (ref 70–99)
Glucose-Capillary: 197 mg/dL — ABNORMAL HIGH (ref 70–99)

## 2019-03-25 LAB — HEPATIC FUNCTION PANEL
ALT: 14 U/L (ref 0–44)
AST: 19 U/L (ref 15–41)
Albumin: 4.1 g/dL (ref 3.5–5.0)
Alkaline Phosphatase: 91 U/L (ref 38–126)
Bilirubin, Direct: 0.1 mg/dL (ref 0.0–0.2)
Indirect Bilirubin: 0.3 mg/dL (ref 0.3–0.9)
Total Bilirubin: 0.4 mg/dL (ref 0.3–1.2)
Total Protein: 7.7 g/dL (ref 6.5–8.1)

## 2019-03-25 LAB — PHOSPHORUS: Phosphorus: 2.2 mg/dL — ABNORMAL LOW (ref 2.5–4.6)

## 2019-03-25 LAB — I-STAT TROPONIN, ED: Troponin i, poc: 0.09 ng/mL (ref 0.00–0.08)

## 2019-03-25 LAB — CBC
HCT: 41.7 % (ref 36.0–46.0)
Hemoglobin: 13.9 g/dL (ref 12.0–15.0)
MCH: 27 pg (ref 26.0–34.0)
MCHC: 33.3 g/dL (ref 30.0–36.0)
MCV: 81.1 fL (ref 80.0–100.0)
Platelets: 335 10*3/uL (ref 150–400)
RBC: 5.14 MIL/uL — ABNORMAL HIGH (ref 3.87–5.11)
RDW: 14.7 % (ref 11.5–15.5)
WBC: 14.6 10*3/uL — ABNORMAL HIGH (ref 4.0–10.5)
nRBC: 0 % (ref 0.0–0.2)

## 2019-03-25 LAB — TROPONIN I
Troponin I: <0.03 ng/mL (ref <0.03)
Troponin I: <0.03 ng/mL (ref <0.03)

## 2019-03-25 LAB — MAGNESIUM
Magnesium: 1.6 mg/dL — ABNORMAL LOW (ref 1.7–2.4)
Magnesium: 1.6 mg/dL — ABNORMAL LOW (ref 1.7–2.4)

## 2019-03-25 LAB — GLUCOSE, CAPILLARY: Glucose-Capillary: 142 mg/dL — ABNORMAL HIGH (ref 70–99)

## 2019-03-25 LAB — LIPASE, BLOOD: Lipase: 25 U/L (ref 11–51)

## 2019-03-25 MED ORDER — ATORVASTATIN CALCIUM 80 MG PO TABS
80.0000 mg | ORAL_TABLET | Freq: Every day | ORAL | Status: DC
  Administered 2019-03-25: 80 mg via ORAL
  Filled 2019-03-25 (×3): qty 1

## 2019-03-25 MED ORDER — ASPIRIN 81 MG PO CHEW
81.0000 mg | CHEWABLE_TABLET | Freq: Every day | ORAL | Status: DC
  Administered 2019-03-25 – 2019-03-27 (×2): 81 mg via ORAL
  Filled 2019-03-25 (×3): qty 1

## 2019-03-25 MED ORDER — METOPROLOL SUCCINATE ER 25 MG PO TB24
25.0000 mg | ORAL_TABLET | Freq: Every day | ORAL | Status: DC
  Administered 2019-03-25 – 2019-03-27 (×2): 25 mg via ORAL
  Filled 2019-03-25 (×3): qty 1

## 2019-03-25 MED ORDER — SODIUM CHLORIDE 0.9 % IV SOLN
INTRAVENOUS | Status: DC
  Administered 2019-03-25 – 2019-03-27 (×5): via INTRAVENOUS

## 2019-03-25 MED ORDER — HYDROMORPHONE HCL 1 MG/ML IJ SOLN
1.0000 mg | Freq: Once | INTRAMUSCULAR | Status: AC
  Administered 2019-03-25: 14:00:00 1 mg via INTRAVENOUS
  Filled 2019-03-25: qty 1

## 2019-03-25 MED ORDER — ACETAMINOPHEN 325 MG PO TABS: 650.0000 mg | ORAL_TABLET | Freq: Four times a day (QID) | ORAL | Status: DC | PRN

## 2019-03-25 MED ORDER — INSULIN GLARGINE 100 UNIT/ML ~~LOC~~ SOLN
24.0000 [IU] | Freq: Every day | SUBCUTANEOUS | Status: DC
  Administered 2019-03-25 – 2019-03-26 (×2): 24 [IU] via SUBCUTANEOUS
  Filled 2019-03-25 (×3): qty 0.24

## 2019-03-25 MED ORDER — POTASSIUM CHLORIDE 10 MEQ/100ML IV SOLN
10.0000 meq | INTRAVENOUS | Status: AC
  Administered 2019-03-25: 10 meq via INTRAVENOUS
  Filled 2019-03-25: qty 100

## 2019-03-25 MED ORDER — LEVOTHYROXINE SODIUM 75 MCG PO TABS
150.0000 ug | ORAL_TABLET | Freq: Every day | ORAL | Status: DC
  Administered 2019-03-26 – 2019-03-27 (×2): 150 ug via ORAL
  Filled 2019-03-25 (×4): qty 2

## 2019-03-25 MED ORDER — PANTOPRAZOLE SODIUM 40 MG PO TBEC
40.0000 mg | DELAYED_RELEASE_TABLET | Freq: Every day | ORAL | Status: DC
  Administered 2019-03-25 – 2019-03-27 (×2): 40 mg via ORAL
  Filled 2019-03-25 (×3): qty 1

## 2019-03-25 MED ORDER — INSULIN REGULAR(HUMAN) IN NACL 100-0.9 UT/100ML-% IV SOLN
INTRAVENOUS | Status: DC
  Administered 2019-03-25: 14:00:00 2.3 [IU]/h via INTRAVENOUS
  Filled 2019-03-25: qty 100

## 2019-03-25 MED ORDER — PROMETHAZINE HCL 25 MG/ML IJ SOLN
12.5000 mg | Freq: Once | INTRAMUSCULAR | Status: AC
  Administered 2019-03-25: 12.5 mg via INTRAVENOUS
  Filled 2019-03-25: qty 1

## 2019-03-25 MED ORDER — SERTRALINE HCL 50 MG PO TABS
50.0000 mg | ORAL_TABLET | Freq: Every day | ORAL | Status: DC
  Administered 2019-03-27: 50 mg via ORAL
  Filled 2019-03-25 (×2): qty 1

## 2019-03-25 MED ORDER — SODIUM CHLORIDE 0.9 % IV BOLUS
1000.0000 mL | Freq: Once | INTRAVENOUS | Status: AC
  Administered 2019-03-25: 1000 mL via INTRAVENOUS

## 2019-03-25 MED ORDER — POTASSIUM CHLORIDE 10 MEQ/100ML IV SOLN
10.0000 meq | INTRAVENOUS | Status: DC
  Administered 2019-03-25 (×4): 10 meq via INTRAVENOUS
  Filled 2019-03-25 (×2): qty 100

## 2019-03-25 MED ORDER — METOCLOPRAMIDE HCL 5 MG/ML IJ SOLN
10.0000 mg | Freq: Once | INTRAMUSCULAR | Status: AC
  Administered 2019-03-25: 13:00:00 10 mg via INTRAVENOUS
  Filled 2019-03-25: qty 2

## 2019-03-25 MED ORDER — DEXTROSE-NACL 5-0.45 % IV SOLN
INTRAVENOUS | Status: DC
  Administered 2019-03-25: 15:00:00 via INTRAVENOUS

## 2019-03-25 MED ORDER — ENOXAPARIN SODIUM 40 MG/0.4ML ~~LOC~~ SOLN
40.0000 mg | Freq: Every day | SUBCUTANEOUS | Status: DC
  Administered 2019-03-25 – 2019-03-26 (×2): 40 mg via SUBCUTANEOUS
  Filled 2019-03-25 (×2): qty 0.4

## 2019-03-25 MED ORDER — POTASSIUM CHLORIDE 10 MEQ/100ML IV SOLN
10.0000 meq | INTRAVENOUS | Status: AC
  Administered 2019-03-25: 10 meq via INTRAVENOUS

## 2019-03-25 MED ORDER — ONDANSETRON HCL 4 MG/2ML IJ SOLN
4.0000 mg | Freq: Four times a day (QID) | INTRAMUSCULAR | Status: DC | PRN
  Administered 2019-03-25 – 2019-03-26 (×3): 4 mg via INTRAVENOUS
  Filled 2019-03-25 (×3): qty 2

## 2019-03-25 MED ORDER — INSULIN ASPART 100 UNIT/ML ~~LOC~~ SOLN
0.0000 [IU] | SUBCUTANEOUS | Status: DC
  Administered 2019-03-25: 2 [IU] via SUBCUTANEOUS
  Administered 2019-03-26: 3 [IU] via SUBCUTANEOUS
  Administered 2019-03-26: 2 [IU] via SUBCUTANEOUS
  Administered 2019-03-26 (×2): 3 [IU] via SUBCUTANEOUS
  Administered 2019-03-27 (×2): 2 [IU] via SUBCUTANEOUS

## 2019-03-25 MED ORDER — PROCHLORPERAZINE EDISYLATE 10 MG/2ML IJ SOLN
10.0000 mg | Freq: Once | INTRAMUSCULAR | Status: AC
  Administered 2019-03-25: 14:00:00 10 mg via INTRAVENOUS
  Filled 2019-03-25: qty 2

## 2019-03-25 NOTE — ED Notes (Signed)
Pt back from X-ray.  

## 2019-03-25 NOTE — ED Triage Notes (Signed)
Pt BIB [facility] for eval of N/V/D and general malaise w/ hyperglycemia onset 0900 this AM. Pt reports taking insulin this AM. Endorses cough, denies fever/chills

## 2019-03-25 NOTE — H&P (Signed)
History and Physical  CALEESI KOHL SAY:[id number] DOB: 02-03-1961 DOA: 03/25/2019  Referring physician: ER physician PCP: Jinny Sanders, MD  Outpatient Specialists:    Patient coming from: Home  Chief Complaint: Abdominal pain  HPI: Patient is a 58 year old female with multiple medical and cardiac history.  Patient carries history of irritable bowel syndrome, poorly controlled diabetes mellitus, congestive heart failure amongst other medical problems.  Patient presents with 3 to 4-day history of lower abdominal pain.  The lower abdominal pain tends to improve with bowel movement.  Earlier today, patient developed mild nausea, vomiting and diarrhea.  Patient reported no feeling too well leading to her presentation to the hospital.  On presentation to the hospital, leukocytosis of 19,800 was noted with a left shift, chemistry revealed potassium of 3.1, CO2 of 20 with anion gap of 16 and blood sugar of 307, chest and abdominal x-ray done were nonrevealing.  Patient will be admitted for further assessment and management.  No headache, no neck pain, no fever or chills, no chest pain, no shortness of breath and no urinary symptoms.  ED Course: Patient is currently on treatment for mild DKA.  Patient has been on insulin drip, with aggressive hydration.  Potassium is been repleted.  Hospitalist service has been called to admit patient.  Pertinent labs:, Potassium of 3.1, chloride of 102, CO2 20, BUN of 11, creatinine of 0.7, anion gap of 16, blood sugar of 307.  First troponin was less than 0.03.  Point-of-care troponin was 0.09.  CBC reveals WBC of 19.8, hemoglobin of 16.6, hematocrit of 40.2, MCV of 81.2 platelet count of 434.  Chest x-ray and abdominal x-ray and nonrevealing  EKG: Independently reviewed.   Imaging: independently reviewed.   Review of Systems:  Negative for fever, visual changes, sore throat, rash, new muscle aches, chest pain, SOB, dysuria, bleeding.  Past Medical History:   Diagnosis Date  . Anxiety   . B12 deficiency   . Chest pain 04/05/2017  . CHF (congestive heart failure) ([place])   . Diabetes mellitus type 1, uncontrolled ([place])    "dr. just changed me to type 1, uncontrolled" (11/26/2018)  . DKA (diabetic ketoacidoses) ([place]) 05/25/2018  . GERD (gastroesophageal reflux disease)   . Hyperlipidemia   . Hypertension    [name]  . Hypothyroidism   . IBS (irritable bowel syndrome)   . Intermittent vertigo   . 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 Leg cramping    "@ night" (09/18/2013)  . Migraine    "once q couple months" (11/26/2018)  . Osteoporosis   . Peripheral neuropathy   . Umbilical hernia    unrepaired (09/18/2013)    Past Surgical History:  Procedure Laterality Date  . CESAREAN SECTION  1989  . LAPAROSCOPIC ASSISTED VAGINAL HYSTERECTOMY  09/23/2012   Procedure: LAPAROSCOPIC ASSISTED VAGINAL HYSTERECTOMY;  Surgeon: Linda Hedges, DO;  Location: [facility];  Service: Gynecology;  Laterality: N/A;  pull Dr Gregor Hams instrument  . LEFT HEART CATH AND CORONARY ANGIOGRAPHY N/A 02/11/2017   Procedure: Left Heart Cath and Coronary Angiography;  Surgeon: Lorretta Harp, MD;  Location: [facility];  Service: Cardiovascular;  Laterality: N/A;  . TUBAL LIGATION Bilateral 1992     reports that she quit smoking about 28 years ago. Her smoking use included cigarettes. She has a 1.20 pack-year smoking history. She has never used smokeless tobacco. She reports current drug use. Drug: Marijuana. She reports that she does not drink alcohol.  Allergies  Allergen Reactions  . Codeine Hives, Anxiety and Other (See Comments)    Family History  Problem Relation Age of Onset  . Diabetes Other   . Heart disease Other   . Heart failure Mother 106       Her heart skipped a beat and stopped - per pt  . Diabetes Maternal Grandmother   . Alzheimer's disease Maternal Grandmother   . Coronary artery disease Maternal Grandmother   .  Heart attack Maternal Grandfather   . Heart failure Brother      Prior to Admission medications   Medication Sig Start Date End Date Taking? Authorizing Provider  acetaminophen (TYLENOL) 325 MG tablet Take 650 mg by mouth every 6 (six) hours as needed for mild pain, fever or headache.   Yes [provider]  aspirin 81 MG chewable tablet Chew 1 tablet (81 mg total) by mouth daily. 02/12/17  Yes Jani Gravel, MD  atorvastatin (LIPITOR) 80 MG tablet Take 1 tablet (80 mg total) by mouth daily at 6 PM. 03/13/18  Yes Bedsole, Amy E, MD  furosemide (LASIX) 20 MG tablet Take 20 mg by mouth as needed for fluid. 01/03/19  Yes [provider]  GLUCAGON EMERGENCY 1 MG injection Inject 1 mg as directed once as needed (blood sugar).  10/08/18  Yes [provider]  insulin lispro (HUMALOG KWIKPEN) 100 UNIT/ML KiwkPen Inject into skin 3 times a day at meal time according to the following sliding scale: CBG 70-120 - 0 units / CBG 121-150 - 1 unit / CBG 151-200 - 2 units / CBG 201-250 - 3 units / CBG 251-300 - 5 units / CBG 301-350 - 7 units / CBG 351-400+ - 9 units  YOU MUST FOLLOW A STRICT DIABETIC DIET Patient taking differently: Inject 0-9 Units into the skin 3 (three) times daily. Inject into skin 3 times a day at meal time according to the following sliding scale: CBG 70-120 - 0 units / CBG 121-150 - 1 unit / CBG 151-200 - 2 units / CBG 201-250 - 3 units / CBG 251-300 - 5 units / CBG 301-350 - 7 units / CBG 351-400+ - 9 units  YOU MUST FOLLOW A STRICT DIABETIC DIET 07/01/18  Yes Bedsole, Amy E, MD  levothyroxine (SYNTHROID, LEVOTHROID) 150 MCG tablet TAKE 1 TABLET BY MOUTH EVERY DAY Patient taking differently: Take 150 mcg by mouth daily before breakfast.  11/10/18  Yes Bedsole, Amy E, MD  losartan (COZAAR) 25 MG tablet TAKE 1 TABLET BY MOUTH EVERY DAY Patient taking differently: Take 25 mg by mouth daily.  03/17/19  Yes Barrett, Evelene Croon, PA-C  metoCLOPramide (REGLAN) 5 MG  tablet TAKE 1 TABLET BY MOUTH 4 TIMES A DAY BEFORE MEALS AND AT BEDTIME Patient taking differently: Take 5 mg by mouth 4 (four) times daily -  before meals and at bedtime.  10/21/18  Yes Bedsole, Amy E, MD  metoprolol succinate (TOPROL-XL) 25 MG 24 hr tablet TAKE 1 TABLET BY MOUTH EVERY DAY Patient taking differently: Take 25 mg by mouth daily.  10/06/18  Yes Minus Breeding, MD  nitroGLYCERIN (NITRODUR - DOSED IN MG/24 HR) 0.2 mg/hr patch APPLY 1 PATCH TO SKIN EVERY DAY AS NEEDED Patient taking differently: Place 0.2 mg onto the skin daily.  08/27/18  Yes Barrett, Evelene Croon, PA-C  pantoprazole (PROTONIX) 40 MG tablet Take 1 tablet (40 mg total) by mouth daily at 12 noon. Patient taking differently: Take 40 mg by mouth daily.  01/28/18  Yes Bedsole, Amy  E, MD  promethazine (PHENERGAN) 25 MG tablet TAKE 1 TABLET BY MOUTH EVERY 8 HOURS AS NEEDED FOR NAUSEA OR VOMITING. Patient taking differently: Take 25 mg by mouth every 8 (eight) hours as needed for nausea or vomiting.  12/24/18  Yes Bedsole, Amy E, MD  TRESIBA FLEXTOUCH 100 UNIT/ML SOPN FlexTouch Pen Inject 0.24 mLs (24 Units total) into the skin at bedtime. On 10/20/18 take at lunch time, on 10/21/18 take at dinner time. On 10/22/18 resume at bedtime regimen. Patient taking differently: Inject 24 Units into the skin at bedtime.  10/20/18  Yes Lavina Hamman, MD  Insulin Pen Needle (PEN NEEDLES) 31G X 8 MM MISC Use to inject insulin 4 times a day.  Dx: E11.10 12/23/17   Jinny Sanders, MD  Lancets (ONETOUCH ULTRASOFT) lancets Use to check blood sugar daily as needed.  Dx: E11.10 12/23/17   Jinny Sanders, MD  ONETOUCH VERIO test strip USE TO CHECK BLOOD SUGAR DAILY AS NEEDED. DX: E11.10 09/21/18   Bedsole, Amy E, MD  sertraline (ZOLOFT) 50 MG tablet TAKE 1 TABLET BY MOUTH EVERY DAY Patient taking differently: Take 50 mg by mouth daily.  12/19/18   Jinny Sanders, MD    Physical Exam: Vitals:   03/25/19 1338 03/25/19 1400 03/25/19 1430 03/25/19 1500   BP: (!) 156/103 (!) 165/98 101/74 94/74  Pulse: 100 96 95 82  Resp: 15  19 17   Temp:      TempSrc:      SpO2: 98% 92% 93% 92%  Weight:      Height:        Constitutional:  . Appears calm and comfortable. Eyes:  . No pallor. No jaundice.  ENMT:  . external ears, nose appear normal.  Dry buccal mucosa Neck:  . Neck is supple. No JVD Respiratory:  . CTA bilaterally, no w/r/r.  . Respiratory effort normal. No retractions or accessory muscle use Cardiovascular:  . S1S2 . No LE extremity edema   Abdomen:  . Abdomen is soft and non tender. Organs are difficult to assess. Neurologic:  . Awake and alert. . Moves all limbs.  Wt Readings from Last 3 Encounters:  03/25/19 63.5 kg  01/13/19 65 kg  11/26/18 64.3 kg    I have personally reviewed following labs and imaging studies  Labs on Admission:  CBC: Recent Labs  Lab 03/25/19 1205 03/25/19 1348  WBC 19.8*  --   NEUTROABS 17.4*  --   HGB 16.6* 16.3*  HCT 48.2* 48.0*  MCV 81.3  --   PLT 434*  --    Basic Metabolic Panel: Recent Labs  Lab 03/25/19 1205 03/25/19 1348  NA 138 138  K 3.1* 3.0*  CL 102  --   CO2 20*  --   GLUCOSE 307*  --   BUN 11  --   CREATININE 0.70  --   CALCIUM 9.5  --    Liver Function Tests: Recent Labs  Lab 03/25/19 1205  AST 19  ALT 14  ALKPHOS 91  BILITOT 0.4  PROT 7.7  ALBUMIN 4.1   Recent Labs  Lab 03/25/19 1205  LIPASE 25   No results for input(s): AMMONIA in the last 168 hours. Coagulation Profile: No results for input(s): INR, PROTIME in the last 168 hours. Cardiac Enzymes: Recent Labs  Lab 03/25/19 1205  TROPONINI <0.03   BNP (last 3 results) No results for input(s): PROBNP in the last 8760 hours. HbA1C: No results for input(s): HGBA1C in the  last 72 hours. CBG: Recent Labs  Lab 03/25/19 1157 03/25/19 1335 03/25/19 1503  GLUCAP 288* 291* 232*   Lipid Profile: No results for input(s): CHOL, HDL, LDLCALC, TRIG, CHOLHDL, LDLDIRECT in the last 72  hours. Thyroid Function Tests: No results for input(s): TSH, T4TOTAL, FREET4, T3FREE, THYROIDAB in the last 72 hours. Anemia Panel: No results for input(s): VITAMINB12, FOLATE, FERRITIN, TIBC, IRON, RETICCTPCT in the last 72 hours. Urine analysis:    Component Value Date/Time   COLORURINE STRAW (A) 01/12/2019 1314   APPEARANCEUR CLEAR 01/12/2019 1314   LABSPEC 1.029 01/12/2019 1314   PHURINE 5.0 01/12/2019 1314   GLUCOSEU >=500 (A) 01/12/2019 1314   HGBUR SMALL (A) 01/12/2019 1314   BILIRUBINUR NEGATIVE 01/12/2019 1314   BILIRUBINUR negative 09/11/2016 1038   KETONESUR 80 (A) 01/12/2019 1314   PROTEINUR 100 (A) 01/12/2019 1314   UROBILINOGEN 0.2 09/11/2016 1038   UROBILINOGEN 0.2 10/28/2013 1723   NITRITE NEGATIVE 01/12/2019 1314   LEUKOCYTESUR NEGATIVE 01/12/2019 1314   Sepsis Labs: @LABRCNTIP (procalcitonin:4,lacticidven:4) )No results found for this or any previous visit (from the past 240 hour(s)).    Radiological Exams on Admission: Dg Abdomen Acute W/chest  Result Date: 03/25/2019 CLINICAL DATA:  Abdominal and chest pain EXAM: DG ABDOMEN ACUTE W/ 1V CHEST COMPARISON:  01/12/2019 FINDINGS: Bowel gas pattern is normal without evidence of ileus, obstruction or free air. No abnormal calcifications or significant bone findings. Mild spinal curvature. One-view chest shows normal heart size and mediastinal shadows. The lungs are clear. No free air seen under the diaphragm. IMPRESSION: Negative radiographs.  No acute finding.  Mild spinal curvature. Electronically Signed   By: Nelson Chimes M.D.   On: 03/25/2019 13:22    EKG: Independently reviewed.   Active Problems:   Nausea and vomiting   Assessment/Plan Abdominal pain in a patient with history of IBS: This could be secondary to irritable bowel syndrome. Negative work-up so far. Abdominal pain is improving. Continue to manage expectantly. Further management will depend on hospital course.  Nausea and vomiting, mild:  Resolved significantly Continue to monitor Start clear liquids and advance as tolerated  Mild diarrhea: Resolved. Continue to monitor closely.  Possible mild DKA with mild anion gap acidosis: Anion gap has resolved. Continue hydration DC insulin drip Monitor blood sugar closely Further management depend on hospital course.  Diabetes mellitus, poorly controlled: Consider diabetic resource team consult. Need to comply with management discussed with patient extensively.  Hypertension: Continue to optimize.  CHF, likely diastolic: Stable. Continue to monitor.  Hypokalemia: Replete and continue to monitor. Follow magnesium level.  Elevated troponin: POC troponin I noted to be 0.09, however, routine troponin I checks have been less than 0.03. No chest pain. No symptoms or signs of acute coronary syndrome. Continue to monitor closely.  DVT prophylaxis: Subcutaneous Lovenox Code Status: Full code Family Communication:  Disposition Plan: Home Consults called: None Admission status: Observation  Time spent: 65 minutes.  Dana Allan, MD  [facility] Pager #: [phone] 7PM-7AM contact night coverage as above  03/25/2019, 4:23 PM

## 2019-03-25 NOTE — ED Notes (Signed)
Informed Dr. Sedonia Small of pt's elevated Troponin result (0.09).

## 2019-03-25 NOTE — ED Notes (Signed)
Patient transported to CT 

## 2019-03-25 NOTE — ED Provider Notes (Signed)
[facility] EMERGENCY DEPARTMENT Provider Note   CSN: [id number] Arrival date & time: 03/25/19  1148    History   Chief Complaint Chief Complaint  Patient presents with  . Nausea  . Hyperglycemia    HPI [name] [name] is a 58 y.o. female with history of anxiety, CHF, diabetes mellitus, hypertension, hyperlipidemia, IBS, migraines peripheral neuropathy medication noncompliance presents for evaluation of acute onset, persistent chest pain, abdominal pain, nausea, vomiting, diarrhea beginning at around 9 AM this morning upon awakening.  She reports that she "felt fine "last night.  Has had several episodes of nonbloody nonbilious emesis and 4 episodes of watery nonbloody diarrhea today.  She reports generalized pain to the chest and abdomen.  No fever, shortness of breath, cough, or known sick contacts.  No recent travel.  Denies urinary symptoms.  She does note hyperglycemia.  She states that lately her blood sugar has been running in the 100s but today was over 300 when she checked it.  She did administer 16 units of insulin this morning.  Reports that she has not been able to keep any food or drink down today.  She is a non-smoker, denies alcohol intake.  Reports that she occasionally smokes marijuana which she last did yesterday.     The history is provided by the patient.    Past Medical History:  Diagnosis Date  . Anxiety   . B12 deficiency   . Chest pain 04/05/2017  . CHF (congestive heart failure) ([place])   . Diabetes mellitus type 1, uncontrolled ([place])    "dr. just changed me to type 1, uncontrolled" (11/26/2018)  . DKA (diabetic ketoacidoses) ([place]) 05/25/2018  . GERD (gastroesophageal reflux disease)   . Hyperlipidemia   . Hypertension    [name]  . Hypothyroidism   . IBS (irritable bowel syndrome)   . Intermittent vertigo   . 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 Leg cramping    "@ night" (09/18/2013)  . Migraine    "once  q couple months" (11/26/2018)  . Osteoporosis   . Peripheral neuropathy   . Umbilical hernia    unrepaired (09/18/2013)    Patient Active Problem List   Diagnosis Date Noted  . DKA (diabetic ketoacidosis) ([place]) 11/26/2018  . SBO (small bowel obstruction) ([place]) 11/26/2018  . DKA (diabetic ketoacidoses) ([place]) 09/23/2018  . Tachycardia 08/04/2018  . MDD (major depressive disorder), single episode, mild ([place]) 07/01/2018  . Colitis 03/07/2018  . Chronic chest pain   . Chronic systolic CHF (congestive heart failure) ([place]) 01/14/2018  . Noncompliance 01/14/2018  . Migraines 12/11/2017  . Chest discomfort 09/18/2017  . DKA, type 2 ([place]) 09/17/2017  . Phrygian cap 06/20/2017  . Leukocytosis   . Lower abdominal pain   . Colitis presumed infectious   . Nonischemic cardiomyopathy ([place]) 03/22/2017  . Hypokalemia 02/15/2017  . NSTEMI (non-ST elevated myocardial infarction) ([place]) 02/11/2017  . H. pylori infection 02/09/2017  . Hypomagnesemia 02/09/2017  . Gastroparesis due to DM ([place]) 01/22/2017  . Poorly controlled type 2 diabetes mellitus with peripheral neuropathy ([place]) 07/17/2016  . Atrial fibrillation ([place]) 02/01/2015  . 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 Hepatic lesion 09/18/2013  . Vitamin D deficiency 05/20/2012  . Essential hypertension 02/01/2011  . Osteoporosis 02/01/2011  . B12 deficiency 04/04/2010  . Peripheral neuropathy ([place]) 05/10/2009  . GERD 04/08/2009  . Generalized anxiety disorder 06/22/2008  . Hypothyroidism 04/29/2008  . Hyperlipidemia 04/29/2008  .  Irritable bowel syndrome 04/29/2008    Past Surgical History:  Procedure Laterality Date  . CESAREAN SECTION  1989  . LAPAROSCOPIC ASSISTED VAGINAL HYSTERECTOMY  09/23/2012   Procedure: LAPAROSCOPIC ASSISTED VAGINAL HYSTERECTOMY;  Surgeon: Linda Hedges, DO;  Location: [facility];  Service: Gynecology;  Laterality: N/A;  pull Dr Gregor Hams instrument  . LEFT HEART CATH AND CORONARY  ANGIOGRAPHY N/A 02/11/2017   Procedure: Left Heart Cath and Coronary Angiography;  Surgeon: Lorretta Harp, MD;  Location: [facility];  Service: Cardiovascular;  Laterality: N/A;  . TUBAL LIGATION Bilateral 1992     OB History    Gravida  2   Para  2   Term      Preterm      AB      Living        SAB      TAB      Ectopic      Multiple      Live Births               Home Medications    Prior to Admission medications   Medication Sig Start Date End Date Taking? Authorizing Provider  acetaminophen (TYLENOL) 325 MG tablet Take 650 mg by mouth every 6 (six) hours as needed for mild pain, fever or headache.   Yes [provider]  aspirin 81 MG chewable tablet Chew 1 tablet (81 mg total) by mouth daily. 02/12/17  Yes Jani Gravel, MD  atorvastatin (LIPITOR) 80 MG tablet Take 1 tablet (80 mg total) by mouth daily at 6 PM. 03/13/18  Yes Bedsole, Amy E, MD  furosemide (LASIX) 20 MG tablet Take 20 mg by mouth as needed for fluid. 01/03/19  Yes [provider]  GLUCAGON EMERGENCY 1 MG injection Inject 1 mg as directed once as needed (blood sugar).  10/08/18  Yes [provider]  insulin lispro (HUMALOG KWIKPEN) 100 UNIT/ML KiwkPen Inject into skin 3 times a day at meal time according to the following sliding scale: CBG 70-120 - 0 units / CBG 121-150 - 1 unit / CBG 151-200 - 2 units / CBG 201-250 - 3 units / CBG 251-300 - 5 units / CBG 301-350 - 7 units / CBG 351-400+ - 9 units  YOU MUST FOLLOW A STRICT DIABETIC DIET Patient taking differently: Inject 0-9 Units into the skin 3 (three) times daily. Inject into skin 3 times a day at meal time according to the following sliding scale: CBG 70-120 - 0 units / CBG 121-150 - 1 unit / CBG 151-200 - 2 units / CBG 201-250 - 3 units / CBG 251-300 - 5 units / CBG 301-350 - 7 units / CBG 351-400+ - 9 units  YOU MUST FOLLOW A STRICT DIABETIC DIET 07/01/18  Yes Bedsole, Amy E, MD  levothyroxine  (SYNTHROID, LEVOTHROID) 150 MCG tablet TAKE 1 TABLET BY MOUTH EVERY DAY Patient taking differently: Take 150 mcg by mouth daily before breakfast.  11/10/18  Yes Bedsole, Amy E, MD  losartan (COZAAR) 25 MG tablet TAKE 1 TABLET BY MOUTH EVERY DAY Patient taking differently: Take 25 mg by mouth daily.  03/17/19  Yes Barrett, Evelene Croon, PA-C  metoCLOPramide (REGLAN) 5 MG tablet TAKE 1 TABLET BY MOUTH 4 TIMES A DAY BEFORE MEALS AND AT BEDTIME Patient taking differently: Take 5 mg by mouth 4 (four) times daily -  before meals and at bedtime.  10/21/18  Yes Bedsole, Amy E, MD  metoprolol succinate (TOPROL-XL) 25 MG 24 hr  tablet TAKE 1 TABLET BY MOUTH EVERY DAY Patient taking differently: Take 25 mg by mouth daily.  10/06/18  Yes Minus Breeding, MD  nitroGLYCERIN (NITRODUR - DOSED IN MG/24 HR) 0.2 mg/hr patch APPLY 1 PATCH TO SKIN EVERY DAY AS NEEDED Patient taking differently: Place 0.2 mg onto the skin daily.  08/27/18  Yes Barrett, Evelene Croon, PA-C  pantoprazole (PROTONIX) 40 MG tablet Take 1 tablet (40 mg total) by mouth daily at 12 noon. Patient taking differently: Take 40 mg by mouth daily.  01/28/18  Yes Bedsole, Amy E, MD  promethazine (PHENERGAN) 25 MG tablet TAKE 1 TABLET BY MOUTH EVERY 8 HOURS AS NEEDED FOR NAUSEA OR VOMITING. Patient taking differently: Take 25 mg by mouth every 8 (eight) hours as needed for nausea or vomiting.  12/24/18  Yes Bedsole, Amy E, MD  TRESIBA FLEXTOUCH 100 UNIT/ML SOPN FlexTouch Pen Inject 0.24 mLs (24 Units total) into the skin at bedtime. On 10/20/18 take at lunch time, on 10/21/18 take at dinner time. On 10/22/18 resume at bedtime regimen. Patient taking differently: Inject 24 Units into the skin at bedtime.  10/20/18  Yes Lavina Hamman, MD  Insulin Pen Needle (PEN NEEDLES) 31G X 8 MM MISC Use to inject insulin 4 times a day.  Dx: E11.10 12/23/17   Jinny Sanders, MD  Lancets (ONETOUCH ULTRASOFT) lancets Use to check blood sugar daily as needed.  Dx: E11.10 12/23/17    Jinny Sanders, MD  ONETOUCH VERIO test strip USE TO CHECK BLOOD SUGAR DAILY AS NEEDED. DX: E11.10 09/21/18   Bedsole, Amy E, MD  sertraline (ZOLOFT) 50 MG tablet TAKE 1 TABLET BY MOUTH EVERY DAY Patient taking differently: Take 50 mg by mouth daily.  12/19/18   Jinny Sanders, MD    Family History Family History  Problem Relation Age of Onset  . Diabetes Other   . Heart disease Other   . Heart failure Mother 28       Her heart skipped a beat and stopped - per pt  . Diabetes Maternal Grandmother   . Alzheimer's disease Maternal Grandmother   . Coronary artery disease Maternal Grandmother   . Heart attack Maternal Grandfather   . Heart failure Brother     Social History Social History   Tobacco Use  . Smoking status: Former Smoker    Packs/day: 0.12    Years: 10.00    Pack years: 1.20    Types: Cigarettes    Last attempt to quit: 12/10/1990    Years since quitting: 28.3  . Smokeless tobacco: Never Used  Substance Use Topics  . Alcohol use: Never    Alcohol/week: 0.0 standard drinks    Frequency: Never  . Drug use: Yes    Types: Marijuana    Comment: 11/26/2018 "quit ~ 1 wk ago; was smoking weed qd"     Allergies   Codeine   Review of Systems Review of Systems  Constitutional: Negative for chills and fever.  Respiratory: Negative for shortness of breath.   Cardiovascular: Positive for chest pain.  Gastrointestinal: Positive for abdominal pain, diarrhea, nausea and vomiting.  Genitourinary: Negative for dysuria, hematuria and urgency.  All other systems reviewed and are negative.    Physical Exam Updated Vital Signs BP 94/74   Pulse 82   Temp 97.9 F (36.6 C) (Oral)   Resp 17   Ht 4\' 11"  (1.499 m)   Wt 63.5 kg   LMP 08/11/2012   SpO2 92%   BMI  28.28 kg/m   Physical Exam Vitals signs and nursing note reviewed.  Constitutional:      General: She is not in acute distress.    Appearance: She is well-developed.  HENT:     Head: Normocephalic and  atraumatic.  Eyes:     General:        Right eye: No discharge.        Left eye: No discharge.     Conjunctiva/sclera: Conjunctivae normal.  Neck:     Musculoskeletal: Normal range of motion and neck supple.     Vascular: No JVD.     Trachea: No tracheal deviation.  Cardiovascular:     Rate and Rhythm: Regular rhythm. Tachycardia present.     Pulses: Normal pulses.  Pulmonary:     Effort: Pulmonary effort is normal.     Breath sounds: Normal breath sounds.     Comments: She is tenderness to palpation of the anterior and lateral chest wall with no deformity, crepitus, ecchymosis, or flail segment noted Chest:     Chest wall: Tenderness present.  Abdominal:     General: Bowel sounds are normal. There is no distension.     Palpations: Abdomen is soft.     Tenderness: There is generalized abdominal tenderness. There is no guarding or rebound.  Skin:    General: Skin is warm and dry.     Findings: No erythema.  Neurological:     Mental Status: She is alert.  Psychiatric:        Behavior: Behavior normal.      ED Treatments / Results  Labs (all labs ordered are listed, but only abnormal results are displayed) Labs Reviewed  BASIC METABOLIC PANEL - Abnormal; Notable for the following components:      Result Value   Potassium 3.1 (*)    CO2 20 (*)    Glucose, Bld 307 (*)    Anion gap 16 (*)    All other components within normal limits  CBC WITH DIFFERENTIAL/PLATELET - Abnormal; Notable for the following components:   WBC 19.8 (*)    RBC 5.93 (*)    Hemoglobin 16.6 (*)    HCT 48.2 (*)    Platelets 434 (*)    Neutro Abs 17.4 (*)    Abs Immature Granulocytes 0.16 (*)    All other components within normal limits  CBG MONITORING, ED - Abnormal; Notable for the following components:   Glucose-Capillary 288 (*)    All other components within normal limits  I-STAT TROPONIN, ED - Abnormal; Notable for the following components:   Troponin i, poc 0.09 (*)    All other  components within normal limits  CBG MONITORING, ED - Abnormal; Notable for the following components:   Glucose-Capillary 291 (*)    All other components within normal limits  POCT I-STAT EG7 - Abnormal; Notable for the following components:   pCO2, Ven 36.6 (*)    pO2, Ven 61.0 (*)    Potassium 3.0 (*)    HCT 48.0 (*)    Hemoglobin 16.3 (*)    All other components within normal limits  CBG MONITORING, ED - Abnormal; Notable for the following components:   Glucose-Capillary 232 (*)    All other components within normal limits  HEPATIC FUNCTION PANEL  LIPASE, BLOOD  TROPONIN I  URINALYSIS, ROUTINE W REFLEX MICROSCOPIC  MAGNESIUM  I-STAT VENOUS BLOOD GAS, ED    EKG EKG Interpretation  Date/Time:  Wednesday March 25 2019 12:01:24 EDT Ventricular Rate:  100 PR Interval:    QRS Duration: 91 QT Interval:  381 QTC Calculation: 492 R Axis:   91 Text Interpretation:  Sinus tachycardia Probable left atrial enlargement Borderline right axis deviation Borderline T wave abnormalities Borderline prolonged QT interval Confirmed by Gerlene Fee [phone]) on 03/25/2019 1:09:04 PM   Radiology Dg Abdomen Acute W/chest  Result Date: 03/25/2019 CLINICAL DATA:  Abdominal and chest pain EXAM: DG ABDOMEN ACUTE W/ 1V CHEST COMPARISON:  01/12/2019 FINDINGS: Bowel gas pattern is normal without evidence of ileus, obstruction or free air. No abnormal calcifications or significant bone findings. Mild spinal curvature. One-view chest shows normal heart size and mediastinal shadows. The lungs are clear. No free air seen under the diaphragm. IMPRESSION: Negative radiographs.  No acute finding.  Mild spinal curvature. Electronically Signed   By: Nelson Chimes M.D.   On: 03/25/2019 13:22    Procedures .Critical Care Performed by: Renita Papa, PA-C Authorized by: Renita Papa, PA-C   Critical care provider statement:    Critical care time (minutes):  40   Critical care was necessary to treat or prevent  imminent or life-threatening deterioration of the following conditions:  Metabolic crisis   Critical care was time spent personally by me on the following activities:  Discussions with consultants, evaluation of patient's response to treatment, examination of patient, ordering and performing treatments and interventions, ordering and review of laboratory studies, ordering and review of radiographic studies, pulse oximetry, re-evaluation of patient's condition, obtaining history from patient or surrogate and review of old charts   I assumed direction of critical care for this patient from another provider in my specialty: no     (including critical care time)  Medications Ordered in ED Medications  insulin regular, human (MYXREDLIN) 100 units/ 100 mL infusion (3.4 Units/hr Intravenous Rate/Dose Change 03/25/19 1505)  sodium chloride 0.9 % bolus 1,000 mL (1,000 mLs Intravenous New Bag/Given 03/25/19 1347)    And  0.9 %  sodium chloride infusion ( Intravenous New Bag/Given 03/25/19 1347)  dextrose 5 %-0.45 % sodium chloride infusion ( Intravenous New Bag/Given 03/25/19 1522)  potassium chloride 10 mEq in 100 mL IVPB (10 mEq Intravenous New Bag/Given 03/25/19 1506)  metoCLOPramide (REGLAN) injection 10 mg (10 mg Intravenous Given 03/25/19 1242)  sodium chloride 0.9 % bolus 1,000 mL (1,000 mLs Intravenous New Bag/Given 03/25/19 1242)  HYDROmorphone (DILAUDID) injection 1 mg (1 mg Intravenous Given 03/25/19 1409)  prochlorperazine (COMPAZINE) injection 10 mg (10 mg Intravenous Given 03/25/19 1409)     Initial Impression / Assessment and Plan / ED Course  I have reviewed the triage vital signs and the nursing notes.  Pertinent labs & imaging results that were available during my care of the patient were reviewed by me and considered in my medical decision making (see chart for details).       Patient presenting for evaluation of generalized chest pains, generalized abdominal pains, nausea, vomiting,  diarrhea beginning today at 9 AM.  She is afebrile, initially tachycardic, vital signs otherwise stable.  Uncomfortable but nontoxic in appearance.  Nonfocal belly examination.  Her chest pain is reproducible to palpation.  Does not sound cardiac in etiology and her EKG shows sinus tachycardia, no acute ischemic changes.  Will obtain lab work, imaging, reassess.  2:32 PM Point-of-care troponin elevated.  EKG shows no ischemic changes.  Patient on reevaluation reports no ongoing chest pain.  Symptoms do not sound cardiac in etiology.  Spoke with Wannetta Sender, Cardiology Master; recommends hospitalist admission, trending, consult  to cardiology if troponin trends upward.   Remainder of labs reviewed by me show a leukocytosis of 19.8, elevated hemoglobin and hematocrit as well suggesting likely dehydration.  She is hyperglycemic, somewhat hypokalemic with potassium of 3.1, elevated anion gap of 16, decreased bicarb of 20.  Work-up consistent with DKA.  Acute abdomen with chest radiographs show no evidence of acute cardiopulmonary abnormalities or evidence of small bowel obstruction or free air.  Low suspicion of acute surgical abdominal pathology.  He does not appear to be septic at this time.  Patient was given IV fluids, IV potassium in the ED, started on insulin drip. Spoke with Dr. Marthenia Rolling with [facility] who agrees to assume care of patient and bring her into the hospital for further evaluation and management.  Final Clinical Impressions(s) / ED Diagnoses   Final diagnoses:  Diabetic ketoacidosis without coma associated with type 1 diabetes mellitus [facility])  Elevated troponin    ED Discharge Orders    None       Renita Papa, PA-C 03/25/19 1607    Maudie Flakes, MD 03/26/19 1159

## 2019-03-26 DIAGNOSIS — K529 Noninfective gastroenteritis and colitis, unspecified: Secondary | ICD-10-CM

## 2019-03-26 DIAGNOSIS — Z794 Long term (current) use of insulin: Secondary | ICD-10-CM

## 2019-03-26 DIAGNOSIS — G8929 Other chronic pain: Secondary | ICD-10-CM

## 2019-03-26 DIAGNOSIS — K581 Irritable bowel syndrome with constipation: Secondary | ICD-10-CM | POA: Diagnosis not present

## 2019-03-26 DIAGNOSIS — E1010 Type 1 diabetes mellitus with ketoacidosis without coma: Secondary | ICD-10-CM | POA: Diagnosis not present

## 2019-03-26 DIAGNOSIS — E785 Hyperlipidemia, unspecified: Secondary | ICD-10-CM | POA: Diagnosis present

## 2019-03-26 DIAGNOSIS — I4891 Unspecified atrial fibrillation: Secondary | ICD-10-CM

## 2019-03-26 DIAGNOSIS — Z90710 Acquired absence of both cervix and uterus: Secondary | ICD-10-CM | POA: Diagnosis not present

## 2019-03-26 DIAGNOSIS — I130 Hypertensive heart and chronic kidney disease with heart failure and stage 1 through stage 4 chronic kidney disease, or unspecified chronic kidney disease: Secondary | ICD-10-CM | POA: Diagnosis not present

## 2019-03-26 DIAGNOSIS — K219 Gastro-esophageal reflux disease without esophagitis: Secondary | ICD-10-CM

## 2019-03-26 DIAGNOSIS — E538 Deficiency of other specified B group vitamins: Secondary | ICD-10-CM

## 2019-03-26 DIAGNOSIS — E7849 Other hyperlipidemia: Secondary | ICD-10-CM

## 2019-03-26 DIAGNOSIS — I5030 Unspecified diastolic (congestive) heart failure: Secondary | ICD-10-CM | POA: Diagnosis present

## 2019-03-26 DIAGNOSIS — E039 Hypothyroidism, unspecified: Secondary | ICD-10-CM

## 2019-03-26 DIAGNOSIS — E1043 Type 1 diabetes mellitus with diabetic autonomic (poly)neuropathy: Secondary | ICD-10-CM | POA: Diagnosis present

## 2019-03-26 DIAGNOSIS — F411 Generalized anxiety disorder: Secondary | ICD-10-CM | POA: Diagnosis present

## 2019-03-26 DIAGNOSIS — R112 Nausea with vomiting, unspecified: Secondary | ICD-10-CM | POA: Diagnosis not present

## 2019-03-26 DIAGNOSIS — K3184 Gastroparesis: Secondary | ICD-10-CM

## 2019-03-26 DIAGNOSIS — E876 Hypokalemia: Secondary | ICD-10-CM | POA: Diagnosis present

## 2019-03-26 DIAGNOSIS — F320 Major depressive disorder, single episode, mild: Secondary | ICD-10-CM

## 2019-03-26 DIAGNOSIS — R079 Chest pain, unspecified: Secondary | ICD-10-CM

## 2019-03-26 DIAGNOSIS — E1142 Type 2 diabetes mellitus with diabetic polyneuropathy: Secondary | ICD-10-CM

## 2019-03-26 DIAGNOSIS — N182 Chronic kidney disease, stage 2 (mild): Secondary | ICD-10-CM | POA: Diagnosis present

## 2019-03-26 DIAGNOSIS — M810 Age-related osteoporosis without current pathological fracture: Secondary | ICD-10-CM | POA: Diagnosis present

## 2019-03-26 DIAGNOSIS — R1030 Lower abdominal pain, unspecified: Secondary | ICD-10-CM | POA: Diagnosis present

## 2019-03-26 DIAGNOSIS — Z9114 Patient's other noncompliance with medication regimen: Secondary | ICD-10-CM | POA: Diagnosis not present

## 2019-03-26 DIAGNOSIS — K5900 Constipation, unspecified: Secondary | ICD-10-CM

## 2019-03-26 DIAGNOSIS — K2970 Gastritis, unspecified, without bleeding: Secondary | ICD-10-CM | POA: Diagnosis present

## 2019-03-26 DIAGNOSIS — F329 Major depressive disorder, single episode, unspecified: Secondary | ICD-10-CM | POA: Diagnosis present

## 2019-03-26 DIAGNOSIS — A09 Infectious gastroenteritis and colitis, unspecified: Secondary | ICD-10-CM | POA: Diagnosis not present

## 2019-03-26 DIAGNOSIS — E1065 Type 1 diabetes mellitus with hyperglycemia: Secondary | ICD-10-CM | POA: Diagnosis present

## 2019-03-26 DIAGNOSIS — E1143 Type 2 diabetes mellitus with diabetic autonomic (poly)neuropathy: Secondary | ICD-10-CM | POA: Diagnosis not present

## 2019-03-26 DIAGNOSIS — K589 Irritable bowel syndrome without diarrhea: Secondary | ICD-10-CM | POA: Diagnosis present

## 2019-03-26 DIAGNOSIS — E1165 Type 2 diabetes mellitus with hyperglycemia: Secondary | ICD-10-CM

## 2019-03-26 DIAGNOSIS — E1042 Type 1 diabetes mellitus with diabetic polyneuropathy: Secondary | ICD-10-CM | POA: Diagnosis present

## 2019-03-26 DIAGNOSIS — E1022 Type 1 diabetes mellitus with diabetic chronic kidney disease: Secondary | ICD-10-CM | POA: Diagnosis present

## 2019-03-26 DIAGNOSIS — I10 Essential (primary) hypertension: Secondary | ICD-10-CM

## 2019-03-26 LAB — BASIC METABOLIC PANEL
Anion gap: 11 (ref 5–15)
Anion gap: 14 (ref 5–15)
Anion gap: 16 — ABNORMAL HIGH (ref 5–15)
BUN: 7 mg/dL (ref 6–20)
BUN: 7 mg/dL (ref 6–20)
BUN: <5 mg/dL — ABNORMAL LOW (ref 6–20)
CO2: 25 mmol/L (ref 22–32)
CO2: 24 mmol/L (ref 22–32)
CO2: 23 mmol/L (ref 22–32)
Calcium: 8.4 mg/dL — ABNORMAL LOW (ref 8.9–10.3)
Calcium: 8.6 mg/dL — ABNORMAL LOW (ref 8.9–10.3)
Calcium: 8.9 mg/dL (ref 8.9–10.3)
Chloride: 104 mmol/L (ref 98–111)
Chloride: 99 mmol/L (ref 98–111)
Chloride: 98 mmol/L (ref 98–111)
Creatinine, Ser: 0.59 mg/dL (ref 0.44–1.00)
Creatinine, Ser: 0.53 mg/dL (ref 0.44–1.00)
Creatinine, Ser: 0.58 mg/dL (ref 0.44–1.00)
GFR calc Af Amer: >60 mL/min (ref >60)
GFR calc Af Amer: >60 mL/min (ref >60)
GFR calc Af Amer: >60 mL/min (ref >60)
GFR calc non Af Amer: >60 mL/min (ref >60)
GFR calc non Af Amer: >60 mL/min (ref >60)
GFR calc non Af Amer: >60 mL/min (ref >60)
Glucose, Bld: 113 mg/dL — ABNORMAL HIGH (ref 70–99)
Glucose, Bld: 171 mg/dL — ABNORMAL HIGH (ref 70–99)
Glucose, Bld: 161 mg/dL — ABNORMAL HIGH (ref 70–99)
Potassium: 2.8 mmol/L — ABNORMAL LOW (ref 3.5–5.1)
Potassium: 2.6 mmol/L — CL (ref 3.5–5.1)
Potassium: 2.8 mmol/L — ABNORMAL LOW (ref 3.5–5.1)
Sodium: 140 mmol/L (ref 135–145)
Sodium: 137 mmol/L (ref 135–145)
Sodium: 137 mmol/L (ref 135–145)

## 2019-03-26 LAB — GLUCOSE, CAPILLARY
Glucose-Capillary: 112 mg/dL — ABNORMAL HIGH (ref 70–99)
Glucose-Capillary: 133 mg/dL — ABNORMAL HIGH (ref 70–99)
Glucose-Capillary: 151 mg/dL — ABNORMAL HIGH (ref 70–99)
Glucose-Capillary: 167 mg/dL — ABNORMAL HIGH (ref 70–99)
Glucose-Capillary: 184 mg/dL — ABNORMAL HIGH (ref 70–99)
Glucose-Capillary: 96 mg/dL (ref 70–99)

## 2019-03-26 LAB — CBC
HCT: 44 % (ref 36.0–46.0)
Hemoglobin: 15.1 g/dL — ABNORMAL HIGH (ref 12.0–15.0)
MCH: 27.7 pg (ref 26.0–34.0)
MCHC: 34.3 g/dL (ref 30.0–36.0)
MCV: 80.7 fL (ref 80.0–100.0)
Platelets: 352 10*3/uL (ref 150–400)
RBC: 5.45 MIL/uL — ABNORMAL HIGH (ref 3.87–5.11)
RDW: 15.1 % (ref 11.5–15.5)
WBC: 17.9 10*3/uL — ABNORMAL HIGH (ref 4.0–10.5)
nRBC: 0 % (ref 0.0–0.2)

## 2019-03-26 LAB — URINALYSIS, ROUTINE W REFLEX MICROSCOPIC
Bilirubin Urine: NEGATIVE
Glucose, UA: >=500 mg/dL — AB
Hgb urine dipstick: NEGATIVE
Ketones, ur: 20 mg/dL — AB
Leukocytes,Ua: NEGATIVE
Nitrite: NEGATIVE
Protein, ur: 30 mg/dL — AB
Specific Gravity, Urine: 1.01 (ref 1.005–1.030)
pH: 9 — ABNORMAL HIGH (ref 5.0–8.0)

## 2019-03-26 LAB — HIV ANTIBODY (ROUTINE TESTING W REFLEX): HIV Screen 4th Generation wRfx: NONREACTIVE

## 2019-03-26 LAB — PHOSPHORUS: Phosphorus: 1.5 mg/dL — ABNORMAL LOW (ref 2.5–4.6)

## 2019-03-26 LAB — MAGNESIUM: Magnesium: 1.5 mg/dL — ABNORMAL LOW (ref 1.7–2.4)

## 2019-03-26 MED ORDER — LIDOCAINE VISCOUS HCL 2 % MT SOLN
15.0000 mL | Freq: Once | OROMUCOSAL | Status: AC
  Administered 2019-03-26: 15 mL via ORAL
  Filled 2019-03-26: qty 15

## 2019-03-26 MED ORDER — POTASSIUM CHLORIDE 10 MEQ/100ML IV SOLN
10.0000 meq | INTRAVENOUS | Status: AC
  Administered 2019-03-26 (×3): 10 meq via INTRAVENOUS
  Filled 2019-03-26 (×3): qty 100

## 2019-03-26 MED ORDER — SODIUM CHLORIDE 0.9 % IV SOLN
250.0000 mg | Freq: Four times a day (QID) | INTRAVENOUS | Status: DC
  Administered 2019-03-26 – 2019-03-27 (×5): 250 mg via INTRAVENOUS
  Filled 2019-03-26 (×10): qty 5

## 2019-03-26 MED ORDER — POTASSIUM CHLORIDE 10 MEQ/100ML IV SOLN
10.0000 meq | INTRAVENOUS | Status: AC
  Administered 2019-03-26 – 2019-03-27 (×4): 10 meq via INTRAVENOUS
  Filled 2019-03-26 (×3): qty 100

## 2019-03-26 MED ORDER — MAGNESIUM SULFATE 2 GM/50ML IV SOLN
2.0000 g | Freq: Once | INTRAVENOUS | Status: AC
  Administered 2019-03-26: 2 g via INTRAVENOUS
  Filled 2019-03-26: qty 50

## 2019-03-26 MED ORDER — METOCLOPRAMIDE HCL 5 MG/ML IJ SOLN
10.0000 mg | Freq: Three times a day (TID) | INTRAMUSCULAR | Status: DC
  Administered 2019-03-26 – 2019-03-27 (×4): 10 mg via INTRAVENOUS
  Filled 2019-03-26 (×5): qty 2

## 2019-03-26 MED ORDER — POTASSIUM CHLORIDE 10 MEQ/100ML IV SOLN
10.0000 meq | Freq: Once | INTRAVENOUS | Status: AC
  Administered 2019-03-26: 10 meq via INTRAVENOUS
  Filled 2019-03-26: qty 100

## 2019-03-26 MED ORDER — PROCHLORPERAZINE EDISYLATE 10 MG/2ML IJ SOLN
10.0000 mg | INTRAMUSCULAR | Status: DC | PRN
  Administered 2019-03-26: 10 mg via INTRAVENOUS
  Filled 2019-03-26: qty 2

## 2019-03-26 MED ORDER — LOSARTAN POTASSIUM 25 MG PO TABS
25.0000 mg | ORAL_TABLET | Freq: Every day | ORAL | Status: DC
  Administered 2019-03-26 – 2019-03-27 (×2): 25 mg via ORAL
  Filled 2019-03-26 (×2): qty 1

## 2019-03-26 MED ORDER — POTASSIUM CHLORIDE CRYS ER 20 MEQ PO TBCR
40.0000 meq | EXTENDED_RELEASE_TABLET | Freq: Four times a day (QID) | ORAL | Status: AC
  Administered 2019-03-26: 40 meq via ORAL
  Filled 2019-03-26 (×2): qty 2

## 2019-03-26 MED ORDER — ALUM & MAG HYDROXIDE-SIMETH 200-200-20 MG/5ML PO SUSP
30.0000 mL | Freq: Once | ORAL | Status: AC
  Administered 2019-03-26: 30 mL via ORAL
  Filled 2019-03-26: qty 30

## 2019-03-26 NOTE — Progress Notes (Signed)
PROGRESS NOTE  [name] [name] SNK:[id number] DOB: 10-23-1961 DOA: 03/25/2019 PCP: Jinny Sanders, MD  Brief History   [name] [name] is a 58 y.o. female with DM 1 / vhistory of DKA, CKD 2, history of CHF, hypertension, hypothyroidism, GERD and IBS.  Also patiently office February 2018 for evaluation of dysphagia and nausea and weight loss.  Her hemoglobin A1c at that time was around 12, vomiting felt to be secondary to diabetic gastroparesis but EGD without other pathology.  Gastric biopsies compatible with minimally active gastritis /pylori.  Patient was treated with Pylera.  She had an unremarkable colonoscopy in 2009 for evaluation of nausea, vomiting, diarrhea and CT scan suggesting pancolitis.   Consultants  . Gastroenterology  Procedures  . None  Antibiotics   Anti-infectives (From admission, onward)   Start     Dose/Rate Route Frequency Ordered Stop   03/26/19 1500  erythromycin 250 mg in sodium chloride 0.9 % 100 mL IVPB     250 mg 100 mL/hr over 60 Minutes Intravenous Every 6 hours 03/26/19 1440      .  Marland Kitchen   Interval History/Subjective  The patient is lying in bed on her side. She states that she feels terrible due to nausea and vomiting every time she tries to eat anything. She also states that she has pain in her chest every time she vomits. She states that she cannot eat anything due to this. She also complains of lower abdominal pain.  Objective   Vitals:  Vitals:   03/26/19 1258 03/26/19 1729  BP: (!) 140/92 (!) 160/96  Pulse: 93 89  Resp:  18  Temp: 98.8 F (37.1 C) 98.1 F (36.7 C)  SpO2: 96% 98%    Exam:  Constitutional:  . The patient is awake, alert, and oriented x 3. She is in mild/moderate distress from nausea and vomiting as well as esophageal pain associated with vomiting. Respiratory:  . CTA bilaterally, no w/r/r.  . Respiratory effort normal. No retractions or accessory muscle use Cardiovascular:  . RRR, no m/r/g . No LE extremity edema   .  Normal pedal pulses Abdomen:  . Abdomen is soft, non-tender, mildly distended. . Normoactive bowel sounds. . No hernias, masses, or organomegaly are appreciated. . No HSM Musculoskeletal:  . No cyanosis, clubbing or edema Skin:  . No rashes, lesions, ulcers . palpation of skin: no induration or nodules Neurologic:  . CN 2-12 intact . Sensation all 4 extremities intact Psychiatric:  . Mental status o Mood, affect appropriate o Orientation to person, place, time  . judgment and insight appear intact     I have personally reviewed the following:   Today's Data  . BMP, Vitals, Urinalysis  Scheduled Meds: . aspirin  81 mg Oral Daily  . atorvastatin  80 mg Oral q1800  . enoxaparin (LOVENOX) injection  40 mg Subcutaneous QHS  . insulin aspart  0-15 Units Subcutaneous Q4H  . insulin glargine  24 Units Subcutaneous QHS  . levothyroxine  150 mcg Oral QAC breakfast  . metoCLOPramide (REGLAN) injection  10 mg Intravenous TID AC & HS  . metoprolol succinate  25 mg Oral Daily  . pantoprazole  40 mg Oral Daily  . sertraline  50 mg Oral Daily   Continuous Infusions: . sodium chloride 125 mL/hr at 03/26/19 1812  . erythromycin 250 mg (03/26/19 1736)  . potassium chloride     Problem  Nausea and Vomiting  Mdd (Major Depressive Disorder), Single Episode, Mild ([name])  Colitis  Chronic Chest Pain  Dka, Type 2 ([name])  Colitis Presumed Infectious  Hypokalemia  Gastroparesis Due to DM ([name])  Poorly Controlled Type 2 Diabetes Mellitus With Peripheral Neuropathy ([name])  Atrial Fibrillation ([name])  Essential Hypertension  B12 Deficiency  GERD  Generalized Anxiety Disorder  Hypothyroidism  Hyperlipidemia  Irritable Bowel Syndrome     A & P   Nausea and Vomiting: The patient is on a clear liquid diet. She complains of chest pain with vomiting. No emesis is recorded by nursing at this point. Antiemetics. I appreciate GI's assistance. They have given patient erythromycin to address  possible IBS/constipation component. [name] [name] also has gastroparesis. She will be continued on her home dose of Reglan.  IBS as per gastroenterology. She tends to be constipation predominant.  Colitis: Present on CT. Gi hasevaluated the patient and does not feel that there is an acute abdominal process at work presently. Will repeat CTAP for unresolved symptoms. Miralax for constipation once pain resolves.  GERD: continue protonix as at home.  DKA: Doubt. No evidence for DKA on admission labs, although anion gap is increasing. Will increase IF fluids and check beta hydroxy butyrate.  Hypokalemia: Supplement. Patient refused oral supplementation. I have changed this to IV. Will follow.   DM II: Poor control. HbA1c is 11.6. FSBS in the last 24 hours has ranged from 113 -171. Patient is currently receiving 24 units of lantus and correction insulin. Monitor.  Atrial fibrillation: Rate is currently well controlled on metoprolol. No anticoagulation.  Essential Hypertension: Blood pressures are a little high today. I will restart the patient's home dose of  Losartan.  Hypothyroidism: Continue synthroid as at home.  Hyperlipidemia: Continue statin as at home.  Chest pain: Chronic. Non-cardiac.  DVT prophylaxis: Lovenox Code Status: Full Code Family Communication: None present Disposition Plan: Home  Hudson Majkowski, DO [facility] Direct contact: see [email or web address]  7PM-7AM contact night coverage as above 03/26/2019, 6:41 PM  LOS: 0 days    LOS: 0 days

## 2019-03-26 NOTE — Progress Notes (Addendum)
Consultation  Referring Provider: [facility]   Primary Care Physician:  Jinny Sanders, MD Primary Gastroenterologist:  Owens Loffler, MD    Reason for Consultation:   Abdominal pain / vomiting    ASSESSMENT / PLAN:     1. 58 yo female admitted with N/V/D in setting of DKA.  -Diarrhea resolved. She usually tends towards constipation. -Resume home Reglan, she takes 5 mg 4 times daily.  Her renal function is okay so will temporarily increase 10 mg IV ac and HS. -Erythromycin 250 mg IV Q 6 hours x 4 doses to promote gastric motility  -Continue zofran prn -continue IVF until taking PO  2. Lower abdominal pain / IBS.  This pain has been present for a few weeks. Eating exacerbates the pain, having a BM helps. At this point I don't think she has an acute  intraabdominal process present however, her WBC is elevated. (? Secondary to DKA). Of note she has had CT scans for similar symptoms. With the exception of one March 2019 suggesting colitis, they have all been unremarkable and several CTAPs w/ contrast since the one Marcy 2019 have been unremarkable.   -Low threshold for repeating CTAP if pain does not improve and/or WBC does not come down expectantly -She has frequent constipation. When N/V resolve will start daily dose of miralax.   3. Intermittent solid food dysphagia reported to admitting team. Patient implies that this is occasional. No odynophagia. No oral Candida on exam.  She does have a history of a Schatzki's ring dilated a couple of years ago with good relief of intermittent dysphagia -We can see in office to arrange for repeat EGD with empirical dilation at some point.  Right now with COVID pandemic upper endoscopic procedures require general anesthesia.   4. Hypokalemic, repletion in progress   HPI:     [name] [name] is a 58 y.o. female with DM 1 / vhistory of DKA, CKD 2, history of CHF, hypertension, hypothyroidism, GERD and IBS.  Also patiently office February  2018 for evaluation of dysphagia and nausea and weight loss.  Her hemoglobin A1c at that time was around 12, vomiting felt to be secondary to diabetic gastroparesis but EGD without other pathology.  Gastric biopsies compatible with minimally active gastritis /pylori.  Patient was treated with Pylera.  She had an unremarkable colonoscopy in 2009 for evaluation of nausea, vomiting, diarrhea and CT scan suggesting pancolitis.   Endoscopic Studies: 01/25/2017 EGD-benign appearing topical stenosis status post dilation.  There was a single mucosal nodule in the stomach   02/12/2008 colonoscopy with terminal ileal intubation - for evaluation of nausea, vomiting,  diarrhea and pancolitis on CT scan.  The exam was complete with a good prep there was no findings of to correlate with CT scan.  Colonic mucosa was normal.  Terminal ileum was intubated and normal as well.  Ileal biopsies unremarkable  Patient brought to ED by EMS yesterday for evaluation of nausea, vomiting, diarrhea, abdominal pain, and chest pain malaise in the setting of hyperglycemia.   ED evaluation: White count markedly elevated at nearly 20 K, , hyperglycemic, hypokalemic, bicarb 20.  Abdominal films negative for acute abnormalities.  Patient admitted for management of DKA.  She was treated with IV fluids, potassium repletion  Nausea and vomiting better this a.m.  Patient says she was vomiting all night but this morning feels better.  She is getting Zofran and Compazine as needed.  She takes Reglan at home, 5 mg 4 times  daily.  Past Medical History:  Diagnosis Date   Anxiety    B12 deficiency    Chest pain 04/05/2017   CHF (congestive heart failure) ([name])    Diabetes mellitus type 1, uncontrolled ([place])    "dr. just changed me to type 1, uncontrolled" (11/26/2018)   DKA (diabetic ketoacidoses) ([place]) 05/25/2018   GERD (gastroesophageal reflux disease)    Hyperlipidemia    Hypertension    [name]   Hypothyroidism    IBS  (irritable bowel syndrome)    Intermittent vertigo    Kidney disease, chronic, stage 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) 10/29/2013   Leg cramping    "@ night" (09/18/2013)   Migraine    "once q couple months" (11/26/2018)   Osteoporosis    Peripheral neuropathy    Umbilical hernia    unrepaired (09/18/2013)    Past Surgical History:  Procedure Laterality Date   [place]  09/23/2012   Procedure: LAPAROSCOPIC ASSISTED VAGINAL HYSTERECTOMY;  Surgeon: Linda Hedges, DO;  Location: [facility];  Service: Gynecology;  Laterality: N/A;  pull Dr Gregor Hams instrument   LEFT HEART CATH AND CORONARY ANGIOGRAPHY N/A 02/11/2017   Procedure: Left Heart Cath and Coronary Angiography;  Surgeon: Lorretta Harp, MD;  Location: [facility];  Service: Cardiovascular;  Laterality: N/A;   TUBAL LIGATION Bilateral 1992    Prior to Admission medications   Medication Sig Start Date End Date Taking? Authorizing Provider  acetaminophen (TYLENOL) 325 MG tablet Take 650 mg by mouth every 6 (six) hours as needed for mild pain, fever or headache.   Yes [provider]  aspirin 81 MG chewable tablet Chew 1 tablet (81 mg total) by mouth daily. 02/12/17  Yes Jani Gravel, MD  atorvastatin (LIPITOR) 80 MG tablet Take 1 tablet (80 mg total) by mouth daily at 6 PM. 03/13/18  Yes Bedsole, Amy E, MD  furosemide (LASIX) 20 MG tablet Take 20 mg by mouth as needed for fluid. 01/03/19  Yes [provider]  GLUCAGON EMERGENCY 1 MG injection Inject 1 mg as directed once as needed (blood sugar).  10/08/18  Yes [provider]  insulin lispro (HUMALOG KWIKPEN) 100 UNIT/ML KiwkPen Inject into skin 3 times a day at meal time according to the following sliding scale: CBG 70-120 - 0 units / CBG 121-150 - 1 unit / CBG 151-200 - 2 units / CBG 201-250 - 3 units / CBG 251-300 - 5 units / CBG 301-350 - 7 units / CBG 351-400+ - 9 units  YOU MUST FOLLOW A  STRICT DIABETIC DIET Patient taking differently: Inject 0-9 Units into the skin 3 (three) times daily. Inject into skin 3 times a day at meal time according to the following sliding scale: CBG 70-120 - 0 units / CBG 121-150 - 1 unit / CBG 151-200 - 2 units / CBG 201-250 - 3 units / CBG 251-300 - 5 units / CBG 301-350 - 7 units / CBG 351-400+ - 9 units  YOU MUST FOLLOW A STRICT DIABETIC DIET 07/01/18  Yes Bedsole, Amy E, MD  levothyroxine (SYNTHROID, LEVOTHROID) 150 MCG tablet TAKE 1 TABLET BY MOUTH EVERY DAY Patient taking differently: Take 150 mcg by mouth daily before breakfast.  11/10/18  Yes Bedsole, Amy E, MD  losartan (COZAAR) 25 MG tablet TAKE 1 TABLET BY MOUTH EVERY DAY Patient taking differently: Take 25 mg by mouth daily.  03/17/19  Yes Barrett, Evelene Croon, PA-C  metoCLOPramide (REGLAN) 5 MG  tablet TAKE 1 TABLET BY MOUTH 4 TIMES A DAY BEFORE MEALS AND AT BEDTIME Patient taking differently: Take 5 mg by mouth 4 (four) times daily -  before meals and at bedtime.  10/21/18  Yes Bedsole, Amy E, MD  metoprolol succinate (TOPROL-XL) 25 MG 24 hr tablet TAKE 1 TABLET BY MOUTH EVERY DAY Patient taking differently: Take 25 mg by mouth daily.  10/06/18  Yes Minus Breeding, MD  nitroGLYCERIN (NITRODUR - DOSED IN MG/24 HR) 0.2 mg/hr patch APPLY 1 PATCH TO SKIN EVERY DAY AS NEEDED Patient taking differently: Place 0.2 mg onto the skin daily.  08/27/18  Yes Barrett, Evelene Croon, PA-C  pantoprazole (PROTONIX) 40 MG tablet Take 1 tablet (40 mg total) by mouth daily at 12 noon. Patient taking differently: Take 40 mg by mouth daily.  01/28/18  Yes Bedsole, Amy E, MD  promethazine (PHENERGAN) 25 MG tablet TAKE 1 TABLET BY MOUTH EVERY 8 HOURS AS NEEDED FOR NAUSEA OR VOMITING. Patient taking differently: Take 25 mg by mouth every 8 (eight) hours as needed for nausea or vomiting.  12/24/18  Yes Bedsole, Amy E, MD  TRESIBA FLEXTOUCH 100 UNIT/ML SOPN FlexTouch Pen Inject 0.24 mLs (24 Units total) into the skin at  bedtime. On 10/20/18 take at lunch time, on 10/21/18 take at dinner time. On 10/22/18 resume at bedtime regimen. Patient taking differently: Inject 24 Units into the skin at bedtime.  10/20/18  Yes Lavina Hamman, MD  Insulin Pen Needle (PEN NEEDLES) 31G X 8 MM MISC Use to inject insulin 4 times a day.  Dx: E11.10 12/23/17   Jinny Sanders, MD  Lancets (ONETOUCH ULTRASOFT) lancets Use to check blood sugar daily as needed.  Dx: E11.10 12/23/17   Jinny Sanders, MD  ONETOUCH VERIO test strip USE TO CHECK BLOOD SUGAR DAILY AS NEEDED. DX: E11.10 09/21/18   Bedsole, Amy E, MD  sertraline (ZOLOFT) 50 MG tablet TAKE 1 TABLET BY MOUTH EVERY DAY Patient taking differently: Take 50 mg by mouth daily.  12/19/18   Jinny Sanders, MD    Current Facility-Administered Medications  Medication Dose Route Frequency Provider Last Rate Last Dose   0.9 %  sodium chloride infusion   Intravenous Continuous Dana Allan I, MD 125 mL/hr at 03/26/19 0851     acetaminophen (TYLENOL) tablet 650 mg  650 mg Oral Q6H PRN Bonnell Public, MD       alum & mag hydroxide-simeth (MAALOX/MYLANTA) 200-200-20 MG/5ML suspension 30 mL  30 mL Oral Once Swayze, Ava, DO       And   lidocaine (XYLOCAINE) 2 % viscous mouth solution 15 mL  15 mL Oral Once Swayze, Ava, DO       aspirin chewable tablet 81 mg  81 mg Oral Daily Dana Allan I, MD   81 mg at 03/25/19 2127   atorvastatin (LIPITOR) tablet 80 mg  80 mg Oral q1800 Dana Allan I, MD   80 mg at 03/25/19 2127   enoxaparin (LOVENOX) injection 40 mg  40 mg Subcutaneous QHS Dana Allan I, MD   40 mg at 03/25/19 2115   insulin aspart (novoLOG) injection 0-15 Units  0-15 Units Subcutaneous Q4H Dana Allan I, MD   2 Units at 03/26/19 1253   insulin glargine (LANTUS) injection 24 Units  24 Units Subcutaneous QHS Dana Allan I, MD   24 Units at 03/25/19 2113   levothyroxine (SYNTHROID, LEVOTHROID) tablet 150 mcg  150 mcg Oral QAC breakfast Bonnell Public, MD  150 mcg at 03/26/19 1610   metoprolol succinate (TOPROL-XL) 24 hr tablet 25 mg  25 mg Oral Daily Dana Allan I, MD   25 mg at 03/25/19 2126   ondansetron (ZOFRAN) injection 4 mg  4 mg Intravenous Q6H PRN Schorr, Rhetta Mura, NP   4 mg at 03/26/19 0945   pantoprazole (PROTONIX) EC tablet 40 mg  40 mg Oral Daily Dana Allan I, MD   40 mg at 03/25/19 2126   potassium chloride 10 mEq in 100 mL IVPB  10 mEq Intravenous Q1 Hr x 2 Swayze, Ava, DO 100 mL/hr at 03/26/19 1301 10 mEq at 03/26/19 1301   potassium chloride SA (K-DUR,KLOR-CON) CR tablet 40 mEq  40 mEq Oral Q6H Schorr, Rhetta Mura, NP   40 mEq at 03/26/19 9604   prochlorperazine (COMPAZINE) injection 10 mg  10 mg Intravenous Q4H PRN Swayze, Ava, DO   10 mg at 03/26/19 1120   sertraline (ZOLOFT) tablet 50 mg  50 mg Oral Daily Dana Allan I, MD        Allergies as of 03/25/2019 - Review Complete 03/25/2019  Allergen Reaction Noted   Codeine Hives, Anxiety, and Other (See Comments)     Family History  Problem Relation Age of Onset   Diabetes Other    Heart disease Other    Heart failure Mother 31       Her heart skipped a beat and stopped - per pt   Diabetes Maternal Grandmother    Alzheimer's disease Maternal Grandmother    Coronary artery disease Maternal Grandmother    Heart attack Maternal Grandfather    Heart failure Brother     Social History   Socioeconomic History   Marital status: Married    Spouse name: Not on file   Number of children: 2   Years of education: Not on file   Highest education level: Not on file  Occupational History   Occupation: UNEMPLOYED    Employer: UNEMPLOYED    Comment: homemaker  Social Designer, fashion/clothing strain: Not on file   Food insecurity:    Worry: Not on file    Inability: Not on file   Transportation needs:    Medical: Not on file    Non-medical: Not on file  Tobacco Use   Smoking status: Former Smoker     Packs/day: 0.12    Years: 10.00    Pack years: 1.20    Types: Cigarettes    Last attempt to quit: 12/10/1990    Years since quitting: 28.3   Smokeless tobacco: Never Used  Substance and Sexual Activity   Alcohol use: Never    Alcohol/week: 0.0 standard drinks    Frequency: Never   Drug use: Yes    Types: Marijuana    Comment: 11/26/2018 "quit ~ 1 wk ago; was smoking weed qd"   Sexual activity: Not Currently  Lifestyle   Physical activity:    Days per week: Not on file    Minutes per session: Not on file   Stress: Not on file  Relationships   Social connections:    Talks on phone: Not on file    Gets together: Not on file    Attends religious service: Not on file    Active member of club or organization: Not on file    Attends meetings of clubs or organizations: Not on file    Relationship status: Not on file   Intimate partner violence:    Fear of current or  ex partner: Not on file    Emotionally abused: Not on file    Physically abused: Not on file    Forced sexual activity: Not on file  Other Topics Concern   Not on file  Social History Narrative   Regular exercise- no    Diet- lots of mountain dew, limits fast foods     Review of Systems: All systems reviewed and negative except where noted in HPI.  Physical Exam: Vital signs in last 24 hours: Temp:  [97.8 F (36.6 C)-98.8 F (37.1 C)] 98.8 F (37.1 C) (04/16 1258) Pulse Rate:  [77-96] 93 (04/16 1258) Resp:  [10-19] 16 (04/16 0437) BP: (94-165)/(65-98) 140/92 (04/16 1258) SpO2:  [89 %-98 %] 96 % (04/16 1258) Weight:  [63.5 kg] 63.5 kg (04/15 2045) Last BM Date: 03/25/19 General:   Alert, well-developed,female in NAD Psych:  Pleasant, cooperative. Normal mood and affect. Eyes:  Pupils equal, sclera clear, no icterus.   Conjunctiva pink. Ears:  Normal auditory acuity. Nose:  No deformity, discharge,  or lesions. Neck:  Supple; no masses Lungs:  Clear throughout to auscultation.   No wheezes,  crackles, or rhonchi.  Heart:  Regular rate and rhythm; no murmurs, no lower extremity edema Abdomen:  Soft, non-distended, mild-mod diffuse lower abdominal tenderness > mid lower abdomen. No palp mass. Hypoactive bowel sounds    Rectal:  Deferred  Msk:  Symmetrical without gross deformities. . Neurologic:  Alert and  oriented x4;  grossly normal neurologically. Skin:  Intact without significant lesions or rashes.   Intake/Output from previous day: 04/15 0701 - 04/16 0700 In: 3565.7 [I.V.:1365.7; IV Piggyback:2200] Out: 0  Intake/Output this shift: No intake/output data recorded.  Lab Results: Recent Labs    03/25/19 1205 03/25/19 1348 03/25/19 1942 03/26/19 0354  WBC 19.8*  --  14.6* 17.9*  HGB 16.6* 16.3* 13.9 15.1*  HCT 48.2* 48.0* 41.7 44.0  PLT 434*  --  335 352   BMET Recent Labs    03/26/19 0006 03/26/19 0354 03/26/19 0748  NA 140 137 137  K 2.8* 2.6* 2.8*  CL 104 99 98  CO2 25 24 23   GLUCOSE 113* 171* 161*  BUN 7 7 <5*  CREATININE 0.59 0.53 0.58  CALCIUM 8.4* 8.6* 8.9   LFT Recent Labs    03/25/19 1205  PROT 7.7  ALBUMIN 4.1  AST 19  ALT 14  ALKPHOS 91  BILITOT 0.4  BILIDIR 0.1  IBILI 0.3    Studies/Results: Dg Abdomen Acute W/chest  Result Date: 03/25/2019 CLINICAL DATA:  Abdominal and chest pain EXAM: DG ABDOMEN ACUTE W/ 1V CHEST COMPARISON:  01/12/2019 FINDINGS: Bowel gas pattern is normal without evidence of ileus, obstruction or free air. No abnormal calcifications or significant bone findings. Mild spinal curvature. One-view chest shows normal heart size and mediastinal shadows. The lungs are clear. No free air seen under the diaphragm. IMPRESSION: Negative radiographs.  No acute finding.  Mild spinal curvature. Electronically Signed   By: Nelson Chimes M.D.   On: 03/25/2019 13:22     Tye Savoy, NP-C @  03/26/2019, 1:52 PM

## 2019-03-26 NOTE — Progress Notes (Addendum)
Notified Swayze, MD via amion text that pt c/o nausea and had given zofran around 0945. Asked if pt could have another PRN med for nausea. Awaiting response. MD added compazine IV PRN   1020 notified MD via amion text that pt also c/o chest pain and pt states that it started yesterday, it happens when she throws up. Pt is on tele monitor SR at 85 bpm.     1740 Notified MD Swayze via amion text that pt refused PO K and Daily medications due to being nauseous.   Paulla Fore, RN

## 2019-03-26 NOTE — Progress Notes (Signed)
CRITICAL VALUE ALERT  Critical Value: K+ 2.6  Date & Time Notied:  03/26/2019  Provider Notified: Raliegh Ip Schorr  Orders Received/Actions taken: K+ tablet 40 mEq

## 2019-03-27 DIAGNOSIS — K5900 Constipation, unspecified: Secondary | ICD-10-CM

## 2019-03-27 LAB — GLUCOSE, CAPILLARY
Glucose-Capillary: 105 mg/dL — ABNORMAL HIGH (ref 70–99)
Glucose-Capillary: 110 mg/dL — ABNORMAL HIGH (ref 70–99)
Glucose-Capillary: 123 mg/dL — ABNORMAL HIGH (ref 70–99)
Glucose-Capillary: 130 mg/dL — ABNORMAL HIGH (ref 70–99)

## 2019-03-27 LAB — BASIC METABOLIC PANEL
Anion gap: 9 (ref 5–15)
BUN: 6 mg/dL (ref 6–20)
CO2: 18 mmol/L — ABNORMAL LOW (ref 22–32)
Calcium: 7.7 mg/dL — ABNORMAL LOW (ref 8.9–10.3)
Chloride: 108 mmol/L (ref 98–111)
Creatinine, Ser: 0.5 mg/dL (ref 0.44–1.00)
GFR calc Af Amer: >60 mL/min (ref >60)
GFR calc non Af Amer: >60 mL/min (ref >60)
Glucose, Bld: 75 mg/dL (ref 70–99)
Potassium: 3.2 mmol/L — ABNORMAL LOW (ref 3.5–5.1)
Sodium: 135 mmol/L (ref 135–145)

## 2019-03-27 LAB — CBC WITH DIFFERENTIAL/PLATELET
Abs Immature Granulocytes: 0.09 10*3/uL — ABNORMAL HIGH (ref 0.00–0.07)
Basophils Absolute: 0 10*3/uL (ref 0.0–0.1)
Basophils Relative: 0 %
Eosinophils Absolute: 0.1 10*3/uL (ref 0.0–0.5)
Eosinophils Relative: 0 %
HCT: 45.5 % (ref 36.0–46.0)
Hemoglobin: 14.9 g/dL (ref 12.0–15.0)
Immature Granulocytes: 1 %
Lymphocytes Relative: 18 %
Lymphs Abs: 3.2 10*3/uL (ref 0.7–4.0)
MCH: 27.2 pg (ref 26.0–34.0)
MCHC: 32.7 g/dL (ref 30.0–36.0)
MCV: 83 fL (ref 80.0–100.0)
Monocytes Absolute: 1.2 10*3/uL — ABNORMAL HIGH (ref 0.1–1.0)
Monocytes Relative: 7 %
Neutro Abs: 13.2 10*3/uL — ABNORMAL HIGH (ref 1.7–7.7)
Neutrophils Relative %: 74 %
Platelets: 355 10*3/uL (ref 150–400)
RBC: 5.48 MIL/uL — ABNORMAL HIGH (ref 3.87–5.11)
RDW: 15 % (ref 11.5–15.5)
WBC: 17.7 10*3/uL — ABNORMAL HIGH (ref 4.0–10.5)
nRBC: 0 % (ref 0.0–0.2)

## 2019-03-27 MED ORDER — HYDROCODONE-ACETAMINOPHEN 5-325 MG PO TABS
2.0000 | ORAL_TABLET | Freq: Once | ORAL | Status: AC
  Administered 2019-03-27: 2 via ORAL
  Filled 2019-03-27: qty 2

## 2019-03-27 MED ORDER — POTASSIUM CHLORIDE 10 MEQ/100ML IV SOLN
10.0000 meq | INTRAVENOUS | Status: AC
  Administered 2019-03-27: 10 meq via INTRAVENOUS
  Filled 2019-03-27: qty 100

## 2019-03-27 MED ORDER — POTASSIUM CHLORIDE 10 MEQ/100ML IV SOLN
10.0000 meq | INTRAVENOUS | Status: AC
  Administered 2019-03-27 (×3): 10 meq via INTRAVENOUS
  Filled 2019-03-27 (×3): qty 100

## 2019-03-27 NOTE — Progress Notes (Signed)
Progress Note    ASSESSMENT AND PLAN:   74. 58 yo female admitted with N/V/D in setting of DKA. Resolving. Normal gastric emptying study in 2018 -started Q6 IV Reglan and IV erythromycin yesterday. Today patient says the N /V have resolved. Tolerated chicken broth for breakfast   -Diarrhea resolved days ago. She usually tends towards constipation. Recommended daily MIralax as needed upon discharge -Resume home Reglan upon discharge, she takes 5 mg 4 times daily. -Due for screening colonoscopy. She will call our office in a few weeks to get this arranged   2. Lower abdominal pain / IBS.  This pain has been present for a few weeks. Eating exacerbates the pain, having a BM helps. At this point I don't think she has an acute  intraabdominal process. Her abdomen is not tender, however the WBC is 17 K is a little uncomfortable, ?etiology  Of note she has had 3 unrevealing CT scans for similar symptoms since Oct 2019. -Low threshold for repeating CTAP if pain does not improve and/or WBC does not come down expectantly -She has frequent constipation. When N/V resolve will start daily dose of miralax.   3. Intermittent solid food dysphagia reported to admitting team. Patient implies that this is occasional. No odynophagia. No oral Candida on exam.  She does have a history of a Schatzki's ring dilated a couple of years ago with good relief of intermittent dysphagia -We can see in office to arrange for repeat EGD with empirical dilation at some point.  Right now with COVID pandemic upper endoscopic procedures require general anesthesia.   4. Hypokalemic, better but still on low side at 3.2   SUBJECTIVE   Feels much better. Denies N/V and no abdominal pain. Wants to go home   OBJECTIVE:     Vital signs in last 24 hours: Temp:  [98 F (36.7 C)-98.8 F (37.1 C)] 98 F (36.7 C) (04/16 2033) Pulse Rate:  [88-93] 88 (04/16 2033) Resp:  [18-19] 19 (04/16 2033) BP: (140-160)/(89-96) 153/89  (04/16 2033) SpO2:  [96 %-98 %] 97 % (04/16 2033) Weight:  [63 kg] 63 kg (04/16 2033) Last BM Date: 03/26/19 General:   Alert, well-developed female in NAD EENT:  Normal hearing, non icteric sclera, conjunctive pink.  Heart:  Regular rate and rhythm; no murmur.  No lower extremity edema   Pulm: Normal respiratory effort, lungs CTA bilaterally without wheezes or crackles. Abdomen:  Soft, protuberant, nontender.  Normal bowel sounds,.  Neurologic:  Alert and  oriented x4;  grossly normal neurologically. Psych:  Pleasant, cooperative.  Normal mood and affect.   Intake/Output from previous day: 04/16 0701 - 04/17 0700 In: 1120 [P.O.:120; I.V.:1000] Out: 550 [Urine:550] Intake/Output this shift: Total I/O In: 200 [P.O.:200] Out: 0   Lab Results: Recent Labs    03/25/19 1942 03/26/19 0354 03/27/19 0724  WBC 14.6* 17.9* 17.7*  HGB 13.9 15.1* 14.9  HCT 41.7 44.0 45.5  PLT 335 352 355   BMET Recent Labs    03/26/19 0354 03/26/19 0748 03/27/19 0724  NA 137 137 135  K 2.6* 2.8* 3.2*  CL 99 98 108  CO2 24 23 18*  GLUCOSE 171* 161* 75  BUN 7 <5* 6  CREATININE 0.53 0.58 0.50  CALCIUM 8.6* 8.9 7.7*   LFT Recent Labs    03/25/19 1205  PROT 7.7  ALBUMIN 4.1  AST 19  ALT 14  ALKPHOS 91  BILITOT 0.4  BILIDIR 0.1  IBILI 0.3   Dg  Abdomen Acute W/chest  Result Date: 03/25/2019 CLINICAL DATA:  Abdominal and chest pain EXAM: DG ABDOMEN ACUTE W/ 1V CHEST COMPARISON:  01/12/2019 FINDINGS: Bowel gas pattern is normal without evidence of ileus, obstruction or free air. No abnormal calcifications or significant bone findings. Mild spinal curvature. One-view chest shows normal heart size and mediastinal shadows. The lungs are clear. No free air seen under the diaphragm. IMPRESSION: Negative radiographs.  No acute finding.  Mild spinal curvature. Electronically Signed   By: Nelson Chimes M.D.   On: 03/25/2019 13:22    Active Problems:   Hypothyroidism   B12 deficiency    Hyperlipidemia   Generalized anxiety disorder   GERD   Irritable bowel syndrome   Essential hypertension   Atrial fibrillation ([name])   Poorly controlled type 2 diabetes mellitus with peripheral neuropathy ([name])   Gastroparesis due to DM ([place])   Hypokalemia   DKA, type 2 ([name])   Chronic chest pain   Colitis   MDD (major depressive disorder), single episode, mild ([name])   Nausea and vomiting   Constipation     LOS: 1 day   Tye Savoy ,NP 03/27/2019, 9:21 AM

## 2019-03-27 NOTE — Progress Notes (Signed)
Notified MD via [place] text that k=3.2  Paulla Fore, RN

## 2019-03-27 NOTE — Discharge Summary (Signed)
Physician Discharge Summary  [name] [name] QMV:[id number] DOB: 01-22-1961 DOA: 03/25/2019  PCP: Jinny Sanders, MD  Admit date: 03/25/2019 Discharge date: 03/27/2019  Recommendations for Outpatient Follow-up:  1. Follow up with PCP in 7-10 days. 2. Follow up with Endocrinologist as directed.   Discharge Diagnoses: Principal diagnosis is #1 Nausea and Vomiting  Mdd (Major Depressive Disorder), Single Episode, Mild ([name])  Colitis  Chronic Chest Pain  Dka, Type 2 ([name])  Colitis Presumed Infectious  Hypokalemia  Gastroparesis Due to DM ([name])  Poorly Controlled Type 2 Diabetes Mellitus With Peripheral Neuropathy ([name])  Atrial Fibrillation ([name])  Essential Hypertension  B12 Deficiency  GERD  Generalized Anxiety Disorder  Hypothyroidism  Hyperlipidemia  Irritable Bowel Syndrome     Discharge Condition: Fair Disposition: Home  Diet recommendation: Carbohydrate controlled diet.  Filed Weights   03/25/19 1155 03/25/19 2045 03/26/19 2033  Weight: 63.5 kg 63.5 kg 63 kg    History of present illness:  Patient is a 58 year old female with multiple medical and cardiac history.  Patient carries history of irritable bowel syndrome, poorly controlled diabetes mellitus, congestive heart failure amongst other medical problems.  Patient presents with 3 to 4-day history of lower abdominal pain.  The lower abdominal pain tends to improve with bowel movement.  Earlier today, patient developed mild nausea, vomiting and diarrhea.  Patient reported no feeling too well leading to her presentation to the hospital.  On presentation to the hospital, leukocytosis of 19,800 was noted with a left shift, chemistry revealed potassium of 3.1, CO2 of 20 with anion gap of 16 and blood sugar of 307, chest and abdominal x-ray done were nonrevealing.  Patient will be admitted for further assessment and management.  No headache, no neck pain, no fever or chills, no chest pain, no shortness of breath and no urinary  symptoms.  In the ED the patient was given treatment for mild DKA.  Patient has been on insulin drip, with aggressive hydration.  Potassium is been repleted.  Hospitalist service has been called to admit patient.  Hospital Course:  [place] a 58 y.o.femalewith DM 1/ vhistory of DKA,CKD 2,history of CHF,hypertension,hypothyroidism,GERD and IBS.Also patiently office February 2018 for evaluation of dysphagia andnausea and weight loss. Her hemoglobin A1c at that time was around 12, vomiting felt to be secondary to diabetic gastroparesis but EGD without other pathology.Gastric biopsies compatible with minimally active gastritis /pylori. Patient was treated with Pylera.She had an unremarkable colonoscopy in 2009 for evaluation of nausea, vomiting, diarrhea and CT scan suggesting pancolitis.  Today's assessment: S: The patient is feeling better. Her dit was advanced to a full liquid diet for lunch today. She has tolerated it well. She will be discharged to home. O: Vitals:  Vitals:   03/26/19 2033 03/27/19 0820  BP: (!) 153/89 140/75  Pulse: 88 89  Resp: 19 19  Temp: 98 F (36.7 C) 97.7 F (36.5 C)  SpO2: 97% 100%    Constitutional:   The patient is awake, alert, and oriented x 3. She is in mild/moderate distress from nausea and vomiting as well as esophageal pain associated with vomiting. Respiratory:   CTA bilaterally, no w/r/r.   Respiratory effort normal. No retractions or accessory muscle use Cardiovascular:   RRR, no m/r/g  No LE extremity edema    Normal pedal pulses Abdomen:   Abdomen is soft, non-tender, mildly distended.  Normoactive bowel sounds.  No hernias, masses, or organomegaly are appreciated.  No HSM Musculoskeletal:   No cyanosis, clubbing  or edema Skin:   No rashes, lesions, ulcers  palpation of skin: no induration or nodules Neurologic:   CN 2-12 intact  Sensation all 4 extremities intact Psychiatric:   Mental  status ? Mood, affect appropriate ? Orientation to person, place, time   judgment and insight appear intact     Discharge Instructions  Discharge Instructions    Activity as tolerated - No restrictions   Complete by:  As directed    Call MD for:  persistant nausea and vomiting   Complete by:  As directed    Diet - low sodium heart healthy   Complete by:  As directed    Discharge instructions   Complete by:  As directed    Follow up with PCP in 7-10 days. Follow Carbohydrate controlled diet.  Check glucoses one a day and take the values in when you visit your PCP. Follow up with Dr. Hartford Poli as directed.   Increase activity slowly   Complete by:  As directed      Allergies as of 03/27/2019      Reactions   Codeine Hives, Anxiety, Other (See Comments)      Medication List    STOP taking these medications   Glucagon Emergency 1 MG injection Generic drug:  glucagon     TAKE these medications   acetaminophen 325 MG tablet Commonly known as:  TYLENOL Take 650 mg by mouth every 6 (six) hours as needed for mild pain, fever or headache.   aspirin 81 MG chewable tablet Chew 1 tablet (81 mg total) by mouth daily.   atorvastatin 80 MG tablet Commonly known as:  LIPITOR Take 1 tablet (80 mg total) by mouth daily at 6 PM.   furosemide 20 MG tablet Commonly known as:  LASIX Take 20 mg by mouth as needed for fluid.   insulin lispro 100 UNIT/ML KiwkPen Commonly known as:  HumaLOG KwikPen Inject into skin 3 times a day at meal time according to the following sliding scale: CBG 70-120 - 0 units / CBG 121-150 - 1 unit / CBG 151-200 - 2 units / CBG 201-250 - 3 units / CBG 251-300 - 5 units / CBG 301-350 - 7 units / CBG 351-400+ - 9 units  YOU MUST FOLLOW A STRICT DIABETIC DIET What changed:    how much to take  how to take this  when to take this   levothyroxine 150 MCG tablet Commonly known as:  SYNTHROID TAKE 1 TABLET BY MOUTH EVERY DAY What changed:  when to take  this   losartan 25 MG tablet Commonly known as:  COZAAR TAKE 1 TABLET BY MOUTH EVERY DAY   metoCLOPramide 5 MG tablet Commonly known as:  REGLAN TAKE 1 TABLET BY MOUTH 4 TIMES A DAY BEFORE MEALS AND AT BEDTIME What changed:  See the new instructions.   metoprolol succinate 25 MG 24 hr tablet Commonly known as:  TOPROL-XL TAKE 1 TABLET BY MOUTH EVERY DAY   nitroGLYCERIN 0.2 mg/hr patch Commonly known as:  NITRODUR - Dosed in mg/24 hr APPLY 1 PATCH TO SKIN EVERY DAY AS NEEDED What changed:  See the new instructions.   onetouch ultrasoft lancets Use to check blood sugar daily as needed.  Dx: E11.10   OneTouch Verio test strip Generic drug:  glucose blood USE TO CHECK BLOOD SUGAR DAILY AS NEEDED. DX: E11.10   pantoprazole 40 MG tablet Commonly known as:  PROTONIX Take 1 tablet (40 mg total) by mouth daily at 12 noon.  What changed:  when to take this   Pen Needles 31G X 8 MM Misc Use to inject insulin 4 times a day.  Dx: E11.10   promethazine 25 MG tablet Commonly known as:  PHENERGAN TAKE 1 TABLET BY MOUTH EVERY 8 HOURS AS NEEDED FOR NAUSEA OR VOMITING. What changed:  See the new instructions.   sertraline 50 MG tablet Commonly known as:  ZOLOFT TAKE 1 TABLET BY MOUTH EVERY DAY   Tresiba FlexTouch 100 UNIT/ML Sopn FlexTouch Pen Generic drug:  insulin degludec Inject 0.24 mLs (24 Units total) into the skin at bedtime. On 10/20/18 take at lunch time, on 10/21/18 take at dinner time. On 10/22/18 resume at bedtime regimen. What changed:  additional instructions      Allergies  Allergen Reactions  . Codeine Hives, Anxiety and Other (See Comments)    The results of significant diagnostics from this hospitalization (including imaging, microbiology, ancillary and laboratory) are listed below for reference.    Significant Diagnostic Studies: Dg Abdomen Acute W/chest  Result Date: 03/25/2019 CLINICAL DATA:  Abdominal and chest pain EXAM: DG ABDOMEN ACUTE W/ 1V CHEST  COMPARISON:  01/12/2019 FINDINGS: Bowel gas pattern is normal without evidence of ileus, obstruction or free air. No abnormal calcifications or significant bone findings. Mild spinal curvature. One-view chest shows normal heart size and mediastinal shadows. The lungs are clear. No free air seen under the diaphragm. IMPRESSION: Negative radiographs.  No acute finding.  Mild spinal curvature. Electronically Signed   By: Nelson Chimes M.D.   On: 03/25/2019 13:22    Microbiology: No results found for this or any previous visit (from the past 240 hour(s)).   Labs: Basic Metabolic Panel: Recent Labs  Lab 03/25/19 1742 03/25/19 1942 03/26/19 0006 03/26/19 0354 03/26/19 0748 03/27/19 0724  NA 139 140 140 137 137 135  K 3.6 3.4* 2.8* 2.6* 2.8* 3.2*  CL 110 107 104 99 98 108  CO2 20* 24 25 24 23  18*  GLUCOSE 190* 143* 113* 171* 161* 75  BUN 11 9 7 7  <5* 6  CREATININE 0.49 0.48 0.59 0.53 0.58 0.50  CALCIUM 8.0* 8.2* 8.4* 8.6* 8.9 7.7*  MG 1.6* 1.6* 1.5*  --   --   --   PHOS  --  2.2* 1.5*  --   --   --    Liver Function Tests: Recent Labs  Lab 03/25/19 1205  AST 19  ALT 14  ALKPHOS 91  BILITOT 0.4  PROT 7.7  ALBUMIN 4.1   Recent Labs  Lab 03/25/19 1205  LIPASE 25   No results for input(s): AMMONIA in the last 168 hours. CBC: Recent Labs  Lab 03/25/19 1205 03/25/19 1348 03/25/19 1942 03/26/19 0354 03/27/19 0724  WBC 19.8*  --  14.6* 17.9* 17.7*  NEUTROABS 17.4*  --   --   --  13.2*  HGB 16.6* 16.3* 13.9 15.1* 14.9  HCT 48.2* 48.0* 41.7 44.0 45.5  MCV 81.3  --  81.1 80.7 83.0  PLT 434*  --  335 352 355   Cardiac Enzymes: Recent Labs  Lab 03/25/19 1205 03/25/19 1813  TROPONINI <0.03 <0.03   BNP: BNP (last 3 results) No results for input(s): BNP in the last 8760 hours.  ProBNP (last 3 results) No results for input(s): PROBNP in the last 8760 hours.  CBG: Recent Labs  Lab 03/26/19 1639 03/26/19 2034 03/27/19 0011 03/27/19 0408 03/27/19 1117  GLUCAP 96  167* 110* 123* 105*    Active Problems:  Hypothyroidism   B12 deficiency   Hyperlipidemia   Generalized anxiety disorder   GERD   Irritable bowel syndrome   Essential hypertension   Atrial fibrillation ([name])   Poorly controlled type 2 diabetes mellitus with peripheral neuropathy ([name])   Gastroparesis due to DM ([name])   Hypokalemia   DKA, type 2 ([name])   Chronic chest pain   Colitis   MDD (major depressive disorder), single episode, mild ([name])   Nausea and vomiting   Constipation   hypokalemia  Time coordinating discharge: 38 minutes.  Signed:        Punam Broussard, DO [facility]  03/27/2019, 3:40 PM

## 2019-03-27 NOTE — Progress Notes (Signed)
Responded to PIV consult. Pt in restroom.

## 2019-03-27 NOTE — Progress Notes (Signed)
Inpatient Diabetes Program Recommendations  AACE/ADA: New Consensus Statement on Inpatient Glycemic Control (2015)  Target Ranges:  Prepandial:   less than 140 mg/dL      Peak postprandial:   less than 180 mg/dL (1-2 hours)      Critically ill patients:  140 - 180 mg/dL   Lab Results  Component Value Date   GLUCAP 105 (H) 03/27/2019   HGBA1C 11.6 (H) 03/25/2019    Review of Glycemic Control  Diabetes history: DM Outpatient Diabetes medications: Tresiba + Humalog scale ac meals tid (patient states usually takes 8 units) Current orders for Inpatient glycemic control: Lantus 24 units + Novolog moderate correction scale q 4 hrs.  Inpatient Diabetes Program Recommendations:   Spoke with patient by phone (DM coordinator working remotely).A1c has decreased from 13.2 to 11.6 since admitted for DKA in February 2020. Patient states that she took her [place] Aas even though she hardly ate anything and followed what she was supposed to do. Reviewed sick day rules with patient. Patient has insulin and supplies without any issues and does not drink sugary drinks, but states she has recently had increased stress . Patient sees Dr. Hartford Poli as her endocrinologist and last office visit documented is 02/18/19. Dr. Hartford Poli is discussing an insulin pump with patient with continuous sensors. Patient ordered [place] sensor.  No additional questions or needs identified @ this time. Thank you, Nani Gasser. Eliabeth Shoff, RN, MSN, CDE  Diabetes Coordinator Inpatient Glycemic Control Team Team Pager [phone] (8am-5pm) 03/27/2019 3:07 PM

## 2019-03-27 NOTE — TOC Initial Note (Signed)
Transition of Care [facility]) - Initial/Assessment Note    Patient Details  Name: [name] [name] MRN: [id number] Date of Birth: 07/26/1961  [facility] CM/SW Contact:    Bartholomew Crews, RN Phone Number: [phone] 03/27/2019, 3:30 PM  Clinical Narrative:                 Spoke with patient at the bedside. Home with husband. States she is hoping to return home once completing all the IV potassium. No home health needs. No DME. Able to follow up with outpatient appointments. Husband to provide transportation home. No transition of care needs identified.   Expected Discharge Plan: Home/Self Care Barriers to Discharge: Continued Medical Work up   Patient Goals and CMS Choice Patient states their goals for this hospitalization and ongoing recovery are:: hopes to go home after getting IV runs of potassium      Expected Discharge Plan and Services Expected Discharge Plan: Home/Self Care In-house Referral: NA Discharge Planning Services: CM Consult                     DME Arranged: N/A DME Agency: NA HH Arranged: NA HH Agency: NA  Prior Living Arrangements/Services   Lives with:: Self, Spouse Patient language and need for interpreter reviewed:: Yes Do you feel safe going back to the place where you live?: Yes      Need for Family Participation in Patient Care: Yes (Comment) Care giver support system in place?: Yes (comment)   Criminal Activity/Legal Involvement Pertinent to Current Situation/Hospitalization: No - Comment as needed  Activities of Daily Living Home Assistive Devices/Equipment: None ADL Screening (condition at time of admission) Patient's cognitive ability adequate to safely complete daily activities?: Yes Is the patient deaf or have difficulty hearing?: No Does the patient have difficulty seeing, even when wearing glasses/contacts?: No Does the patient have difficulty concentrating, remembering, or making decisions?: No Patient able to express need for  assistance with ADLs?: Yes Does the patient have difficulty dressing or bathing?: No Independently performs ADLs?: Yes (appropriate for developmental age) Does the patient have difficulty walking or climbing stairs?: No Weakness of Legs: None Weakness of Arms/Hands: None  Permission Sought/Granted                  Emotional Assessment Appearance:: Appears older than stated age Attitude/Demeanor/Rapport: Engaged Affect (typically observed): Accepting Orientation: : Oriented to Self, Oriented to  Time, Oriented to Place, Oriented to Situation   Psych Involvement: No (comment)  Admission diagnosis:  Elevated troponin [R79.89] Diabetic ketoacidosis without coma associated with type 1 diabetes mellitus ([place]) [E10.10] Patient Active Problem List   Diagnosis Date Noted  . Constipation   . Nausea and vomiting 03/25/2019  . DKA (diabetic ketoacidosis) ([place]) 11/26/2018  . SBO (small bowel obstruction) ([place]) 11/26/2018  . DKA (diabetic ketoacidoses) ([place]) 09/23/2018  . Tachycardia 08/04/2018  . MDD (major depressive disorder), single episode, mild ([place]) 07/01/2018  . Colitis 03/07/2018  . Chronic chest pain   . Chronic systolic CHF (congestive heart failure) ([place]) 01/14/2018  . Noncompliance 01/14/2018  . Migraines 12/11/2017  . Chest discomfort 09/18/2017  . DKA, type 2 ([place]) 09/17/2017  . Phrygian cap 06/20/2017  . Leukocytosis   . Lower abdominal pain   . Colitis presumed infectious   . Nonischemic cardiomyopathy ([place]) 03/22/2017  . Hypokalemia 02/15/2017  . NSTEMI (non-ST elevated myocardial infarction) ([place]) 02/11/2017  . H. pylori infection 02/09/2017  . Hypomagnesemia 02/09/2017  . Gastroparesis due to  DM ([place]) 01/22/2017  . Poorly controlled type 2 diabetes mellitus with peripheral neuropathy ([place]) 07/17/2016  . Atrial fibrillation ([place]) 02/01/2015  . 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 Hepatic lesion 09/18/2013  . Vitamin D  deficiency 05/20/2012  . Essential hypertension 02/01/2011  . Osteoporosis 02/01/2011  . B12 deficiency 04/04/2010  . Peripheral neuropathy ([place]) 05/10/2009  . GERD 04/08/2009  . Generalized anxiety disorder 06/22/2008  . Hypothyroidism 04/29/2008  . Hyperlipidemia 04/29/2008  . Irritable bowel syndrome 04/29/2008   PCP:  Jinny Sanders, MD Pharmacy:   [id number] - [place], [place] [address] [place] [address] Phone: [phone] Fax: [phone]     Social Determinants of Health (SDOH) Interventions    Readmission Risk Interventions No flowsheet data found.

## 2019-03-31 ENCOUNTER — Telehealth: Payer: Self-pay

## 2019-03-31 NOTE — Telephone Encounter (Signed)
During encounter for TCM, patient requested refill for Phenergan to be sent to [facility].  Last filled: 12/24/18, #30

## 2019-03-31 NOTE — Telephone Encounter (Signed)
Attempted to reach patient on primary phone. Attempt unsuccessful. Left message with contact info.

## 2019-03-31 NOTE — Telephone Encounter (Signed)
Transitional Care Management Follow-up Telephone Call    Date discharged? 03/27/19  How have you been since you were released from the hospital? [place].   Any patient concerns? Patient want to discuss cholesterol medication at next appt with PCP.   Items Reviewed:  Medications reviewed: Yes  Allergies reviewed: Yes  Dietary changes reviewed: Yes  Referrals reviewed: Yes   Functional Questionnaire:  Independent - I Dependent - D    Activities of Daily Living (ADLs):    Personal hygiene - I Dressing - I Eating - I Maintaining continence - I Transferring - I   Independent Activities of Daily Living (iADLs): Basic communication skills - I Transportation - I Meal preparation  - I Shopping - I Housework - I Managing medications - I  Managing personal finances - I   Confirmed importance and date/time of follow-up visits scheduled YES  Provider Appointment booked with PCP 04/02/19 @ 1140  Confirmed with patient if condition begins to worsen call PCP or go to the ER.  Patient was given the office number and encouraged to call back with question or concerns: YES

## 2019-03-31 NOTE — Telephone Encounter (Signed)
Okay to refill phenergan as requested, same as last refill.

## 2019-03-31 NOTE — Telephone Encounter (Signed)
Attempt to reach patient for transitional care management based on discharge summary as follows:   Admit date: 03/25/2019 Discharge date: 03/27/2019  Recommendations for Outpatient Follow-up:  1. Follow up with PCP in 7-10 days. 2. Follow up with Endocrinologist as directed.   Discharge Diagnoses: Principal diagnosis is #1 Nausea and Vomiting  Mdd (Major Depressive Disorder), Single Episode, Mild ([name])  Colitis  Chronic Chest Pain  Dka, Type 2 ([name])  Colitis Presumed Infectious  Hypokalemia  Gastroparesis Due to DM ([name])  Poorly Controlled Type 2 Diabetes Mellitus With Peripheral Neuropathy ([name])  Atrial Fibrillation ([name])  Essential Hypertension  B12 Deficiency  GERD  Generalized Anxiety Disorder  Hypothyroidism  Hyperlipidemia  Irritable Bowel Syndrome

## 2019-03-31 NOTE — Telephone Encounter (Signed)
Last office visit 10/28/2018 for hospital follow up.  Last refilled 12/24/2018 for #30 with no refills.  Doxy[facility]Me appointment scheduled 04/02/2019.

## 2019-04-01 MED ORDER — PROMETHAZINE HCL 25 MG PO TABS: ORAL_TABLET | ORAL | 0 refills | Status: DC

## 2019-04-01 NOTE — Telephone Encounter (Signed)
Refill sent as instructed by Dr. Diona Browner.

## 2019-04-02 ENCOUNTER — Encounter: Payer: Self-pay | Admitting: Family Medicine

## 2019-04-02 ENCOUNTER — Ambulatory Visit (INDEPENDENT_AMBULATORY_CARE_PROVIDER_SITE_OTHER): Payer: 59 | Admitting: Family Medicine

## 2019-04-02 VITALS — Ht 59.0 in | Wt 142.0 lb

## 2019-04-02 DIAGNOSIS — I428 Other cardiomyopathies: Secondary | ICD-10-CM

## 2019-04-02 DIAGNOSIS — E1142 Type 2 diabetes mellitus with diabetic polyneuropathy: Secondary | ICD-10-CM | POA: Diagnosis not present

## 2019-04-02 DIAGNOSIS — E1165 Type 2 diabetes mellitus with hyperglycemia: Secondary | ICD-10-CM

## 2019-04-02 DIAGNOSIS — K529 Noninfective gastroenteritis and colitis, unspecified: Secondary | ICD-10-CM | POA: Diagnosis not present

## 2019-04-02 DIAGNOSIS — E7849 Other hyperlipidemia: Secondary | ICD-10-CM | POA: Diagnosis not present

## 2019-04-02 MED ORDER — EZETIMIBE 10 MG PO TABS: 10.0000 mg | ORAL_TABLET | Freq: Every day | ORAL | 3 refills | Status: DC

## 2019-04-02 MED ORDER — FEXOFENADINE HCL 180 MG PO TABS: 180.0000 mg | ORAL_TABLET | Freq: Every day | ORAL | 3 refills | Status: DC

## 2019-04-02 NOTE — Assessment & Plan Note (Signed)
HAs upcoming follow up with cardiology. Dr. Percival Spanish.

## 2019-04-02 NOTE — Assessment & Plan Note (Signed)
Resolved. Pt doing well using phenergan prn. Nml po intake.

## 2019-04-02 NOTE — Assessment & Plan Note (Signed)
Some improvement and pt more compliant with meds. Followed closely by Dr. Hartford Poli ENDO.

## 2019-04-02 NOTE — Progress Notes (Signed)
VIRTUAL VISIT Due to national recommendations of social distancing due to [place] 19, a virtual visit is felt to be most appropriate for this patient at this time.   I connected with the patient on 04/02/19 at 11:40 AM EDT by virtual telehealth platform and verified that I am speaking with the correct person using two identifiers.   I discussed the limitations, risks, security and privacy concerns of performing an evaluation and management service by  virtual telehealth platform and the availability of in person appointments. I also discussed with the patient that there may be a patient responsible charge related to this service. The patient expressed understanding and agreed to proceed.  Patient location: Home Provider Location: [facility] [facility] Participants: Eliezer Lofts and Cherlyn Cushing   Chief Complaint  Patient presents with  . Hospitalization Follow-up    History of Present Illness: 58 year old female pt with poorly controlled diabetes, diabetic gastroparesis, IBS presents for hospital follow up.  Admitted: 03/25/19 Discharged 03/27/19 Dx; N/V, DKA, ABD pain from presumed infectious colitis. On presentation to the hospital, leukocytosis of 19,800 was noted with a left shift, chemistry revealed potassium of 3.1, CO2 of 20 with anion gap of 16 and blood sugar of 307, chest and abdominal x-ray done were nonrevealing. Treated for mild DKA  Dr. Ardis Hughs PA in hospital.. no changes. Things improved while she was in the hospital.   February 2018:evaluation of dysphagia andnausea and weight loss. Vomiting felt to be secondary to diabetic gastroparesis but EGD without other pathology.Gastric biopsies compatible with minimally active gastritis /pylori. Patient was treated with Pylera.She had an unremarkable colonoscopy in 2009 for evaluation of nausea, vomiting, diarrhea and CT scan suggesting pancolitis.  Today she reports she has been compliant with her meds.  No N/V, still some  fatigue.  No fever, no abd pain.   ENDO Dr. Hartford Poli. Had appt yesterday with ENDO. Reviewed note in deatil. A1C improving some for 13 to 11  She is trying to walk some.  working on stress reduction.  Has follow up next week with Dr. Percival Spanish   May need to discuss recent low potassium and  Lasix use as all loss may not be from Gi loss.  cholesterol control inadequate on lipitor alone in 01/2019  LDL 124.Marland Kitchen goal < 100  She has been taking atorvastatin but not zetia. Okay to restart.  COVID 19 screen No recent travel or known exposure to COVID19 The patient denies respiratory symptoms of COVID 19 at this time.  The importance of social distancing was discussed today.   Review of Systems  Constitutional: Negative for chills and fever.  HENT: Negative for congestion and ear pain.   Eyes: Negative for pain and redness.  Respiratory: Negative for cough and shortness of breath.   Cardiovascular: Negative for chest pain, palpitations and leg swelling.  Gastrointestinal: Negative for abdominal pain, blood in stool, constipation, diarrhea, nausea and vomiting.  Genitourinary: Negative for dysuria.  Musculoskeletal: Negative for falls and myalgias.  Skin: Negative for rash.  Neurological: Negative for dizziness.  Psychiatric/Behavioral: Negative for depression. The patient is not nervous/anxious.       Past Medical History:  Diagnosis Date  . Anxiety   . B12 deficiency   . Chest pain 04/05/2017  . CHF (congestive heart failure) ([place])   . Diabetes mellitus type 1, uncontrolled ([place])    "dr. just changed me to type 1, uncontrolled" (11/26/2018)  . DKA (diabetic ketoacidoses) ([place]) 05/25/2018  . GERD (gastroesophageal reflux disease)   .  Hyperlipidemia   . Hypertension    [name]  . Hypothyroidism   . IBS (irritable bowel syndrome)   . Intermittent vertigo   . 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 Leg cramping    "@ night" (09/18/2013)  . Migraine    "once q  couple months" (11/26/2018)  . Osteoporosis   . Peripheral neuropathy   . Umbilical hernia    unrepaired (09/18/2013)    reports that she quit smoking about 28 years ago. Her smoking use included cigarettes. She has a 1.20 pack-year smoking history. She has never used smokeless tobacco. She reports current drug use. Drug: Marijuana. She reports that she does not drink alcohol.   Current Outpatient Medications:  .  acetaminophen (TYLENOL) 325 MG tablet, Take 650 mg by mouth every 6 (six) hours as needed for mild pain, fever or headache., Disp: , Rfl:  .  aspirin 81 MG chewable tablet, Chew 1 tablet (81 mg total) by mouth daily., Disp: 30 tablet, Rfl: 0 .  atorvastatin (LIPITOR) 80 MG tablet, Take 1 tablet (80 mg total) by mouth daily at 6 PM., Disp: 90 tablet, Rfl: 3 .  Continuous Blood Gluc Sensor (FREESTYLE LIBRE 14 DAY SENSOR) MISC, by Does not apply route., Disp: , Rfl:  .  furosemide (LASIX) 20 MG tablet, Take 20 mg by mouth as needed for fluid., Disp: , Rfl:  .  insulin lispro (HUMALOG KWIKPEN) 100 UNIT/ML KiwkPen, Inject into skin 3 times a day at meal time according to the following sliding scale: CBG 70-120 - 0 units / CBG 121-150 - 1 unit / CBG 151-200 - 2 units / CBG 201-250 - 3 units / CBG 251-300 - 5 units / CBG 301-350 - 7 units / CBG 351-400+ - 9 units  YOU MUST FOLLOW A STRICT DIABETIC DIET (Patient taking differently: Inject 0-9 Units into the skin 3 (three) times daily. Inject into skin 3 times a day at meal time according to the following sliding scale: CBG 70-120 - 0 units / CBG 121-150 - 1 unit / CBG 151-200 - 2 units / CBG 201-250 - 3 units / CBG 251-300 - 5 units / CBG 301-350 - 7 units / CBG 351-400+ - 9 units  YOU MUST FOLLOW A STRICT DIABETIC DIET), Disp: 4 pen, Rfl: 11 .  Insulin Pen Needle (PEN NEEDLES) 31G X 8 MM MISC, Use to inject insulin 4 times a day.  Dx: E11.10, Disp: 130 each, Rfl: 11 .  Lancets (ONETOUCH ULTRASOFT) lancets, Use to check blood sugar daily as needed.   Dx: E11.10, Disp: 100 each, Rfl: 3 .  levothyroxine (SYNTHROID, LEVOTHROID) 150 MCG tablet, TAKE 1 TABLET BY MOUTH EVERY DAY (Patient taking differently: Take 150 mcg by mouth daily before breakfast. ), Disp: 90 tablet, Rfl: 3 .  losartan (COZAAR) 25 MG tablet, TAKE 1 TABLET BY MOUTH EVERY DAY (Patient taking differently: Take 25 mg by mouth daily. ), Disp: 90 tablet, Rfl: 1 .  metoCLOPramide (REGLAN) 5 MG tablet, TAKE 1 TABLET BY MOUTH 4 TIMES A DAY BEFORE MEALS AND AT BEDTIME (Patient taking differently: Take 5 mg by mouth 4 (four) times daily -  before meals and at bedtime. ), Disp: 120 tablet, Rfl: 1 .  metoprolol succinate (TOPROL-XL) 25 MG 24 hr tablet, TAKE 1 TABLET BY MOUTH EVERY DAY (Patient taking differently: Take 25 mg by mouth daily. ), Disp: 90 tablet, Rfl: 2 .  nitroGLYCERIN (NITRODUR - DOSED IN MG/24 HR) 0.2 mg/hr patch,  APPLY 1 PATCH TO SKIN EVERY DAY AS NEEDED (Patient taking differently: Place 0.2 mg onto the skin daily. ), Disp: 90 patch, Rfl: 1 .  ONETOUCH VERIO test strip, USE TO CHECK BLOOD SUGAR DAILY AS NEEDED. DX: E11.10, Disp: 100 each, Rfl: 3 .  pantoprazole (PROTONIX) 40 MG tablet, Take 1 tablet (40 mg total) by mouth daily at 12 noon. (Patient taking differently: Take 40 mg by mouth daily. ), Disp: 30 tablet, Rfl: 11 .  promethazine (PHENERGAN) 25 MG tablet, TAKE 1 TABLET BY MOUTH EVERY 8 HOURS AS NEEDED FOR NAUSEA OR VOMITING., Disp: 30 tablet, Rfl: 0 .  sertraline (ZOLOFT) 50 MG tablet, TAKE 1 TABLET BY MOUTH EVERY DAY (Patient taking differently: Take 50 mg by mouth daily. ), Disp: 90 tablet, Rfl: 1 .  TRESIBA FLEXTOUCH 100 UNIT/ML SOPN FlexTouch Pen, Inject 0.24 mLs (24 Units total) into the skin at bedtime. On 10/20/18 take at lunch time, on 10/21/18 take at dinner time. On 10/22/18 resume at bedtime regimen. (Patient taking differently: Inject 24 Units into the skin at bedtime. ), Disp: , Rfl: 5   Observations/Objective: Height 4\' 11"  (1.499 m), weight 142 lb (64.4  kg), last menstrual period 08/11/2012.  Physical Exam  Physical Exam Constitutional:      General: She is not in acute distress. Pulmonary:     Effort: Pulmonary effort is normal. No respiratory distress.  Neurological:     Mental Status: She is alert and oriented to person, place, and time.  Psychiatric:        Mood and Affect: Mood normal.        Behavior: Behavior normal.   Assessment and Plan Hyperlipidemia Continue lipitor and restart zetia. Re-eval in 3 months.  Poorly controlled type 2 diabetes mellitus with peripheral neuropathy ([name]) Some improvement and pt more compliant with meds. Followed closely by Dr. Hartford Poli ENDO.  Nonischemic cardiomyopathy ([place]) HAs upcoming follow up with cardiology. Dr. Percival Spanish.  Colitis presumed infectious Resolved. Pt doing well using phenergan prn. Nml po intake.     I discussed the assessment and treatment plan with the patient. The patient was provided an opportunity to ask questions and all were answered. The patient agreed with the plan and demonstrated an understanding of the instructions.   The patient was advised to call back or seek an in-person evaluation if the symptoms worsen or if the condition fails to improve as anticipated.     Eliezer Lofts, MD

## 2019-04-02 NOTE — Assessment & Plan Note (Signed)
Continue lipitor and restart zetia. Re-eval in 3 months.

## 2019-04-03 ENCOUNTER — Telehealth: Payer: Self-pay

## 2019-04-03 NOTE — Progress Notes (Signed)
8/7 LABS 8/11 CPX PT AWARE

## 2019-04-03 NOTE — Telephone Encounter (Signed)

## 2019-04-09 NOTE — Progress Notes (Signed)
Virtual Visit via Video Note   This visit type was conducted due to national recommendations for restrictions regarding the COVID-19 Pandemic (e.g. social distancing) in an effort to limit this patient's exposure and mitigate transmission in our community.  Due to her co-morbid illnesses, this patient is at least at moderate risk for complications without adequate follow up.  This format is felt to be most appropriate for this patient at this time.  All issues noted in this document were discussed and addressed.  A limited physical exam was performed with this format.  Please refer to the patient's chart for her consent to telehealth for [facility].   Evaluation Performed:  Follow-up visit  Date:  04/10/2019   ID:  [name] [name], DOB [birth year], MRN [id number]  Patient Location: Home Provider Location: Home  PCP:  Jinny Sanders, MD  Cardiologist:  Minus Breeding, MD  Electrophysiologist:  None   Chief Complaint:  Chest pain  History of Present Illness:    [name] [name] is a 58 y.o. female who presents for follow up of atrial fib. She had atrial fib in 2012.  She had an abnormal stress test with normal coronaries on cath.  EF is 55%.  She was having problems with her thyroid at that point in time. She had chest pain and a POET (Plain Old Exercise Treadmill) .  This was negative for ischemia.  I did send her for an event monitor to make sure she was not having any recurrent fibrillation.  There was never any evidence of fibrillation.  She was taken off of anticoagulation as it was felt that her atrial fibrillation was related to her thyroid problems at the time.  These had since been corrected.  In 2018 she was found to have an elevated troponin and an EF of 35-40%.  Cardiac cath demonstrated normal coronaries however.    She has very poorly controlled diabetes.  She is unfortunately not compliant with medications.  Lexiscan Myoview in May of last year was without ischemia.  She has been  hospitalized many times for poorly controlled diabetes since I last saw her.  Of note her EF was 50 - 55% during one of these admissions earlier this year.     Since she was last in the hospital she has had no new acute complaints.  She has had less chest discomfort than she had previously.  She does not report that she is taking any nitroglycerin.  She walks about 6000 steps a day and often walks 100 yards to her mailbox.  She thinks she is doing better since she has been wearing a nitroglycerin patch.  She cleans around her house.  She has no new shortness of breath, PND or orthopnea.  She has no significant palpitations, presyncope or syncope.  Her husband still thinks that she might stop breathing at night and she is not yet had a sleep study.  Of note she is following with an endocrinologist and has been for about 6 months and has had med changes.  She said that her A1c is better although the most recent one I see is 11.6.  The patient does not have symptoms concerning for COVID-19 infection (fever, chills, cough, or new shortness of breath).    Past Medical History:  Diagnosis Date  . Anxiety   . B12 deficiency   . Chest pain 04/05/2017  . CHF (congestive heart failure) ([place])   . Diabetes mellitus type 1, uncontrolled ([place])    "  dr. just changed me to type 1, uncontrolled" (11/26/2018)  . DKA (diabetic ketoacidoses) ([place]) 05/25/2018  . GERD (gastroesophageal reflux disease)   . Hyperlipidemia   . Hypertension    [name]  . Hypothyroidism   . IBS (irritable bowel syndrome)   . Intermittent vertigo   . 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 Leg cramping    "@ night" (09/18/2013)  . Migraine    "once q couple months" (11/26/2018)  . Osteoporosis   . Peripheral neuropathy   . Umbilical hernia    unrepaired (09/18/2013)   Past Surgical History:  Procedure Laterality Date  . CESAREAN SECTION  1989  . LAPAROSCOPIC ASSISTED VAGINAL HYSTERECTOMY  09/23/2012    Procedure: LAPAROSCOPIC ASSISTED VAGINAL HYSTERECTOMY;  Surgeon: Linda Hedges, DO;  Location: [facility];  Service: Gynecology;  Laterality: N/A;  pull Dr Gregor Hams instrument  . LEFT HEART CATH AND CORONARY ANGIOGRAPHY N/A 02/11/2017   Procedure: Left Heart Cath and Coronary Angiography;  Surgeon: Lorretta Harp, MD;  Location: [facility];  Service: Cardiovascular;  Laterality: N/A;  . TUBAL LIGATION Bilateral 1992     Current Meds  Medication Sig  . acetaminophen (TYLENOL) 325 MG tablet Take 650 mg by mouth every 6 (six) hours as needed for mild pain, fever or headache.  Marland Kitchen aspirin 81 MG chewable tablet Chew 1 tablet (81 mg total) by mouth daily.  Marland Kitchen atorvastatin (LIPITOR) 80 MG tablet Take 1 tablet (80 mg total) by mouth daily at 6 PM.  . Continuous Blood Gluc Sensor (FREESTYLE LIBRE 14 DAY SENSOR) MISC by Does not apply route.  . ezetimibe (ZETIA) 10 MG tablet Take 1 tablet (10 mg total) by mouth daily.  . fexofenadine (ALLEGRA) 180 MG tablet Take 1 tablet (180 mg total) by mouth daily.  . furosemide (LASIX) 20 MG tablet Take 20 mg by mouth as needed for fluid.  Marland Kitchen insulin lispro (HUMALOG KWIKPEN) 100 UNIT/ML KiwkPen Inject into skin 3 times a day at meal time according to the following sliding scale: CBG 70-120 - 0 units / CBG 121-150 - 1 unit / CBG 151-200 - 2 units / CBG 201-250 - 3 units / CBG 251-300 - 5 units / CBG 301-350 - 7 units / CBG 351-400+ - 9 units  YOU MUST FOLLOW A STRICT DIABETIC DIET (Patient taking differently: Inject 0-9 Units into the skin 3 (three) times daily. Inject into skin 3 times a day at meal time according to the following sliding scale: CBG 70-120 - 0 units / CBG 121-150 - 1 unit / CBG 151-200 - 2 units / CBG 201-250 - 3 units / CBG 251-300 - 5 units / CBG 301-350 - 7 units / CBG 351-400+ - 9 units  YOU MUST FOLLOW A STRICT DIABETIC DIET)  . Lancets (ONETOUCH ULTRASOFT) lancets Use to check blood sugar daily as needed.  Dx: E11.10  .  levothyroxine (SYNTHROID, LEVOTHROID) 150 MCG tablet TAKE 1 TABLET BY MOUTH EVERY DAY (Patient taking differently: Take 150 mcg by mouth daily before breakfast. )  . losartan (COZAAR) 25 MG tablet TAKE 1 TABLET BY MOUTH EVERY DAY (Patient taking differently: Take 25 mg by mouth daily. )  . metoCLOPramide (REGLAN) 5 MG tablet TAKE 1 TABLET BY MOUTH 4 TIMES A DAY BEFORE MEALS AND AT BEDTIME (Patient taking differently: Take 5 mg by mouth 4 (four) times daily -  before meals and at bedtime. )  . metoprolol succinate (TOPROL-XL) 25 MG 24 hr tablet TAKE  1 TABLET BY MOUTH EVERY DAY (Patient taking differently: Take 25 mg by mouth daily. )  . nitroGLYCERIN (NITRODUR - DOSED IN MG/24 HR) 0.2 mg/hr patch APPLY 1 PATCH TO SKIN EVERY DAY AS NEEDED (Patient taking differently: Place 0.2 mg onto the skin daily. )  . ONETOUCH VERIO test strip USE TO CHECK BLOOD SUGAR DAILY AS NEEDED. DX: E11.10  . pantoprazole (PROTONIX) 40 MG tablet Take 1 tablet (40 mg total) by mouth daily at 12 noon. (Patient taking differently: Take 40 mg by mouth daily. )  . promethazine (PHENERGAN) 25 MG tablet TAKE 1 TABLET BY MOUTH EVERY 8 HOURS AS NEEDED FOR NAUSEA OR VOMITING.  . sertraline (ZOLOFT) 50 MG tablet TAKE 1 TABLET BY MOUTH EVERY DAY (Patient taking differently: Take 50 mg by mouth daily. )  . TRESIBA FLEXTOUCH 100 UNIT/ML SOPN FlexTouch Pen Inject 0.24 mLs (24 Units total) into the skin at bedtime. On 10/20/18 take at lunch time, on 10/21/18 take at dinner time. On 10/22/18 resume at bedtime regimen. (Patient taking differently: Inject 24 Units into the skin at bedtime. )     Allergies:   Codeine   Social History   Tobacco Use  . Smoking status: Former Smoker    Packs/day: 0.12    Years: 10.00    Pack years: 1.20    Types: Cigarettes    Last attempt to quit: 12/10/1990    Years since quitting: 28.3  . Smokeless tobacco: Never Used  Substance Use Topics  . Alcohol use: Never    Alcohol/week: 0.0 standard drinks     Frequency: Never  . Drug use: Yes    Types: Marijuana    Comment: 11/26/2018 "quit ~ 1 wk ago; was smoking weed qd"     Family Hx: The patient's family history includes Alzheimer's disease in her maternal grandmother; Coronary artery disease in her maternal grandmother; Diabetes in her maternal grandmother and another family member; Heart attack in her maternal grandfather; Heart disease in an other family member; Heart failure in her brother; Heart failure (age of onset: 73) in her mother.  ROS:   Please see the history of present illness.    As stated in the HPI and negative for all other systems.   Prior CV studies:   The following studies were reviewed today:  Extensive review of hospital records for this visit.   Labs/Other Tests and Data Reviewed:    EKG:  NSR, rate 100, axis within normal limits, QTC prolonged, no acute ST-T wave changes, 03/25/2019  Recent Labs: 09/11/2018: TSH 1.374 03/25/2019: ALT 14 03/26/2019: Magnesium 1.5 03/27/2019: BUN 6; Creatinine, Ser 0.50; Hemoglobin 14.9; Platelets 355; Potassium 3.2; Sodium 135   Recent Lipid Panel Lab Results  Component Value Date/Time   CHOL 197 01/27/2019 08:09 AM   TRIG 148.0 01/27/2019 08:09 AM   HDL 43.10 01/27/2019 08:09 AM   CHOLHDL 5 01/27/2019 08:09 AM   LDLCALC 124 (H) 01/27/2019 08:09 AM   LDLDIRECT 206.0 07/27/2014 07:45 AM    Wt Readings from Last 3 Encounters:  04/10/19 142 lb (64.4 kg)  04/02/19 142 lb (64.4 kg)  03/26/19 138 lb 14.2 oz (63 kg)     Objective:    Vital Signs:  BP 114/81   Pulse 98   Ht 4\' 11"  (1.499 m)   Wt 142 lb (64.4 kg)   LMP 08/11/2012   BMI 28.68 kg/m    VITAL SIGNS:  reviewed GEN:  no acute distress EYES:  sclerae anicteric, EOMI - Extraocular  Movements Intact RESPIRATORY:  normal respiratory effort, symmetric expansion NEURO:  alert and oriented x 3, no obvious focal deficit PSYCH:  normal affect  ASSESSMENT & PLAN:    Chronic systolic/diastolic heart failure -      EF was about 50% in Feb.  She seems to be euvolemic by history today.  She knows to watch salt and fluid.  No change in therapy is indicated.  No further imaging at this point.  Noncardiac chest pain -   She has a stable pattern of chest discomfort improved with nitroglycerin patch.  No further testing or change in therapy.   HTN -    Her blood pressure is controlled.  She will continue the meds as listed.   Poorly controlled diabetes -  Her A1c is poorly controlled that she is now being followed by an endocrinologist.  No change in therapy.  Dyslipidemia -   Her LDL is not at target.  It was 124.  I am going to stop her Lipitor and change her Crestor to 40 mg daily.  She will get a lipid profile liver enzymes in about 10 weeks.  Previous history of atrial fibrillation -    She has had no symptomatic recurrence of this.  No change in therapy.  Questionable sleep apnea.     She was supposed to have follow-up of this.  She has had low O2 sats in the hospital.   We will try to arrange a sleep study for the fall.      Current medicines are reviewed at length with the patient today.  The patient does not have concerns regarding medicines.  COVID-19 Education: The signs and symptoms of COVID-19 were discussed with the patient and how to seek care for testing (follow up with PCP or arrange E-visit).  The importance of social distancing was discussed today.  Time:   Today, I have spent 25 minutes with the patient with telehealth technology discussing the above problems.     Medication Adjustments/Labs and Tests Ordered: Current medicines are reviewed at length with the patient today.  Concerns regarding medicines are outlined above.   Tests Ordered: No orders of the defined types were placed in this encounter.   Medication Changes: No orders of the defined types were placed in this encounter.   Disposition:  Follow up see an APP in six months.   Signed, Minus Breeding, MD  04/10/2019  10:34 AM    [facility]

## 2019-04-10 ENCOUNTER — Telehealth (INDEPENDENT_AMBULATORY_CARE_PROVIDER_SITE_OTHER): Payer: 59 | Admitting: Cardiology

## 2019-04-10 ENCOUNTER — Telehealth: Payer: 59 | Admitting: Cardiology

## 2019-04-10 ENCOUNTER — Encounter: Payer: Self-pay | Admitting: Cardiology

## 2019-04-10 VITALS — BP 114/81 | HR 98 | Ht 59.0 in | Wt 142.0 lb

## 2019-04-10 DIAGNOSIS — E785 Hyperlipidemia, unspecified: Secondary | ICD-10-CM | POA: Diagnosis not present

## 2019-04-10 DIAGNOSIS — I10 Essential (primary) hypertension: Secondary | ICD-10-CM

## 2019-04-10 DIAGNOSIS — R0683 Snoring: Secondary | ICD-10-CM | POA: Insufficient documentation

## 2019-04-10 DIAGNOSIS — R079 Chest pain, unspecified: Secondary | ICD-10-CM

## 2019-04-10 DIAGNOSIS — Z0000 Encounter for general adult medical examination without abnormal findings: Secondary | ICD-10-CM | POA: Insufficient documentation

## 2019-04-10 DIAGNOSIS — Z79899 Other long term (current) drug therapy: Secondary | ICD-10-CM

## 2019-04-10 DIAGNOSIS — Z7189 Other specified counseling: Secondary | ICD-10-CM

## 2019-04-10 MED ORDER — ROSUVASTATIN CALCIUM 40 MG PO TABS: 40.0000 mg | ORAL_TABLET | Freq: Every day | ORAL | 3 refills | Status: DC

## 2019-04-10 NOTE — Patient Instructions (Addendum)
Medication Instructions:  STOP- Atorvastatin START- Crestor 40 mg daily  If you need a refill on your cardiac medications before your next appointment, please call your pharmacy.  Labwork: Fasting Lipids and Liver in 10 weeks HERE IN OUR OFFICE AT [facility]  You will need to fast. DO NOT EAT OR DRINK PAST MIDNIGHT.      Take the provided lab slips with you to the lab for your blood draw.   When you have your labs (blood work) drawn today and your tests are completely normal, you will receive your results only by MyChart Message (if you have MyChart) -OR-  A paper copy in the mail.  If you have any lab test that is abnormal or we need to change your treatment, we will call you to review these results.  Testing/Procedures: Your physician has recommended that you have a sleep study in October. This test records several body functions during sleep, including: brain activity, eye movement, oxygen and carbon dioxide blood levels, heart rate and rhythm, breathing rate and rhythm, the flow of air through your mouth and nose, snoring, body muscle movements, and chest and belly movement.    Follow-Up: .    Your physician recommends that you schedule a follow-up appointment in: 6 Months   At [facility], you and your health needs are our priority.  As part of our continuing mission to provide you with exceptional heart care, we have created designated Provider Care Teams.  These Care Teams include your primary Cardiologist (physician) and Advanced Practice Providers (APPs -  Physician Assistants and Nurse Practitioners) who all work together to provide you with the care you need, when you need it.  Thank you for choosing [facility] at [facility]!!

## 2019-04-13 ENCOUNTER — Other Ambulatory Visit: Payer: Self-pay | Admitting: Family Medicine

## 2019-04-13 NOTE — Telephone Encounter (Signed)
Last office visit 04/02/2019 for hospital follow up.  Last refilled 10/21/2018 for #120 with 1 refill.  CPE scheduled 07/21/2019.

## 2019-04-23 ENCOUNTER — Observation Stay (HOSPITAL_COMMUNITY)
Admission: EM | Admit: 2019-04-23 | Discharge: 2019-04-24 | Disposition: A | Discharge status: Home / Self Care | Payer: 59 | Attending: Internal Medicine | Admitting: Internal Medicine

## 2019-04-23 ENCOUNTER — Encounter (HOSPITAL_COMMUNITY): Payer: Self-pay | Admitting: *Deleted

## 2019-04-23 ENCOUNTER — Other Ambulatory Visit: Payer: Self-pay

## 2019-04-23 DIAGNOSIS — E785 Hyperlipidemia, unspecified: Secondary | ICD-10-CM | POA: Diagnosis not present

## 2019-04-23 DIAGNOSIS — Z1159 Encounter for screening for other viral diseases: Secondary | ICD-10-CM | POA: Diagnosis not present

## 2019-04-23 DIAGNOSIS — I130 Hypertensive heart and chronic kidney disease with heart failure and stage 1 through stage 4 chronic kidney disease, or unspecified chronic kidney disease: Secondary | ICD-10-CM | POA: Insufficient documentation

## 2019-04-23 DIAGNOSIS — I10 Essential (primary) hypertension: Secondary | ICD-10-CM | POA: Diagnosis not present

## 2019-04-23 DIAGNOSIS — Z79890 Hormone replacement therapy (postmenopausal): Secondary | ICD-10-CM | POA: Insufficient documentation

## 2019-04-23 DIAGNOSIS — Z79899 Other long term (current) drug therapy: Secondary | ICD-10-CM | POA: Insufficient documentation

## 2019-04-23 DIAGNOSIS — Z7982 Long term (current) use of aspirin: Secondary | ICD-10-CM | POA: Diagnosis not present

## 2019-04-23 DIAGNOSIS — I5022 Chronic systolic (congestive) heart failure: Secondary | ICD-10-CM | POA: Diagnosis present

## 2019-04-23 DIAGNOSIS — E1143 Type 2 diabetes mellitus with diabetic autonomic (poly)neuropathy: Secondary | ICD-10-CM | POA: Diagnosis not present

## 2019-04-23 DIAGNOSIS — K219 Gastro-esophageal reflux disease without esophagitis: Secondary | ICD-10-CM | POA: Diagnosis not present

## 2019-04-23 DIAGNOSIS — E039 Hypothyroidism, unspecified: Secondary | ICD-10-CM | POA: Diagnosis not present

## 2019-04-23 DIAGNOSIS — E860 Dehydration: Secondary | ICD-10-CM | POA: Diagnosis not present

## 2019-04-23 DIAGNOSIS — E1022 Type 1 diabetes mellitus with diabetic chronic kidney disease: Secondary | ICD-10-CM | POA: Insufficient documentation

## 2019-04-23 DIAGNOSIS — Z833 Family history of diabetes mellitus: Secondary | ICD-10-CM | POA: Insufficient documentation

## 2019-04-23 DIAGNOSIS — R000 Tachycardia, unspecified: Secondary | ICD-10-CM | POA: Diagnosis not present

## 2019-04-23 DIAGNOSIS — N182 Chronic kidney disease, stage 2 (mild): Secondary | ICD-10-CM | POA: Diagnosis present

## 2019-04-23 DIAGNOSIS — Z885 Allergy status to narcotic agent status: Secondary | ICD-10-CM | POA: Diagnosis not present

## 2019-04-23 DIAGNOSIS — Z8249 Family history of ischemic heart disease and other diseases of the circulatory system: Secondary | ICD-10-CM | POA: Diagnosis not present

## 2019-04-23 DIAGNOSIS — K3184 Gastroparesis: Secondary | ICD-10-CM | POA: Insufficient documentation

## 2019-04-23 DIAGNOSIS — E1010 Type 1 diabetes mellitus with ketoacidosis without coma: Secondary | ICD-10-CM | POA: Diagnosis not present

## 2019-04-23 DIAGNOSIS — Z87891 Personal history of nicotine dependence: Secondary | ICD-10-CM | POA: Diagnosis not present

## 2019-04-23 DIAGNOSIS — Z794 Long term (current) use of insulin: Secondary | ICD-10-CM | POA: Insufficient documentation

## 2019-04-23 DIAGNOSIS — F419 Anxiety disorder, unspecified: Secondary | ICD-10-CM | POA: Insufficient documentation

## 2019-04-23 DIAGNOSIS — E1043 Type 1 diabetes mellitus with diabetic autonomic (poly)neuropathy: Secondary | ICD-10-CM | POA: Insufficient documentation

## 2019-04-23 DIAGNOSIS — E1110 Type 2 diabetes mellitus with ketoacidosis without coma: Secondary | ICD-10-CM | POA: Diagnosis present

## 2019-04-23 DIAGNOSIS — R51 Headache: Secondary | ICD-10-CM | POA: Diagnosis not present

## 2019-04-23 LAB — CBC
HCT: 46.5 % — ABNORMAL HIGH (ref 36.0–46.0)
Hemoglobin: 15.5 g/dL — ABNORMAL HIGH (ref 12.0–15.0)
MCH: 27.9 pg (ref 26.0–34.0)
MCHC: 33.3 g/dL (ref 30.0–36.0)
MCV: 83.8 fL (ref 80.0–100.0)
Platelets: 455 10*3/uL — ABNORMAL HIGH (ref 150–400)
RBC: 5.55 MIL/uL — ABNORMAL HIGH (ref 3.87–5.11)
RDW: 15.1 % (ref 11.5–15.5)
WBC: 17.9 10*3/uL — ABNORMAL HIGH (ref 4.0–10.5)
nRBC: 0 % (ref 0.0–0.2)

## 2019-04-23 LAB — POCT I-STAT EG7
Acid-base deficit: 6 mmol/L — ABNORMAL HIGH (ref 0.0–2.0)
Bicarbonate: 18.5 mmol/L — ABNORMAL LOW (ref 20.0–28.0)
Calcium, Ion: 1.03 mmol/L — ABNORMAL LOW (ref 1.15–1.40)
HCT: 47 % — ABNORMAL HIGH (ref 36.0–46.0)
Hemoglobin: 16 g/dL — ABNORMAL HIGH (ref 12.0–15.0)
O2 Saturation: 93 %
Potassium: 3.8 mmol/L (ref 3.5–5.1)
Sodium: 135 mmol/L (ref 135–145)
TCO2: 20 mmol/L — ABNORMAL LOW (ref 22–32)
pCO2, Ven: 32.9 mmHg — ABNORMAL LOW (ref 44.0–60.0)
pH, Ven: 7.36 (ref 7.250–7.430)
pO2, Ven: 68 mmHg — ABNORMAL HIGH (ref 32.0–45.0)

## 2019-04-23 LAB — COMPREHENSIVE METABOLIC PANEL
ALT: 13 U/L (ref 0–44)
AST: 17 U/L (ref 15–41)
Albumin: 4.2 g/dL (ref 3.5–5.0)
Alkaline Phosphatase: 94 U/L (ref 38–126)
Anion gap: 22 — ABNORMAL HIGH (ref 5–15)
BUN: 11 mg/dL (ref 6–20)
CO2: 16 mmol/L — ABNORMAL LOW (ref 22–32)
Calcium: 9.4 mg/dL (ref 8.9–10.3)
Chloride: 98 mmol/L (ref 98–111)
Creatinine, Ser: 0.99 mg/dL (ref 0.44–1.00)
GFR calc Af Amer: >60 mL/min (ref >60)
GFR calc non Af Amer: >60 mL/min (ref >60)
Glucose, Bld: 521 mg/dL (ref 70–99)
Potassium: 3.7 mmol/L (ref 3.5–5.1)
Sodium: 136 mmol/L (ref 135–145)
Total Bilirubin: 0.9 mg/dL (ref 0.3–1.2)
Total Protein: 7.6 g/dL (ref 6.5–8.1)

## 2019-04-23 LAB — BASIC METABOLIC PANEL
Anion gap: 18 — ABNORMAL HIGH (ref 5–15)
Anion gap: 14 (ref 5–15)
BUN: 10 mg/dL (ref 6–20)
BUN: 9 mg/dL (ref 6–20)
CO2: 20 mmol/L — ABNORMAL LOW (ref 22–32)
CO2: 22 mmol/L (ref 22–32)
Calcium: 9.3 mg/dL (ref 8.9–10.3)
Calcium: 9.1 mg/dL (ref 8.9–10.3)
Chloride: 99 mmol/L (ref 98–111)
Chloride: 103 mmol/L (ref 98–111)
Creatinine, Ser: 0.98 mg/dL (ref 0.44–1.00)
Creatinine, Ser: 0.62 mg/dL (ref 0.44–1.00)
GFR calc Af Amer: >60 mL/min (ref >60)
GFR calc Af Amer: >60 mL/min (ref >60)
GFR calc non Af Amer: >60 mL/min (ref >60)
GFR calc non Af Amer: >60 mL/min (ref >60)
Glucose, Bld: 426 mg/dL — ABNORMAL HIGH (ref 70–99)
Glucose, Bld: 229 mg/dL — ABNORMAL HIGH (ref 70–99)
Potassium: 3.7 mmol/L (ref 3.5–5.1)
Potassium: 3.2 mmol/L — ABNORMAL LOW (ref 3.5–5.1)
Sodium: 137 mmol/L (ref 135–145)
Sodium: 139 mmol/L (ref 135–145)

## 2019-04-23 LAB — TSH: TSH: 10.067 u[IU]/mL — ABNORMAL HIGH (ref 0.350–4.500)

## 2019-04-23 LAB — CBG MONITORING, ED
Glucose-Capillary: 519 mg/dL (ref 70–99)
Glucose-Capillary: 403 mg/dL — ABNORMAL HIGH (ref 70–99)

## 2019-04-23 LAB — GLUCOSE, CAPILLARY
Glucose-Capillary: 372 mg/dL — ABNORMAL HIGH (ref 70–99)
Glucose-Capillary: 313 mg/dL — ABNORMAL HIGH (ref 70–99)
Glucose-Capillary: 260 mg/dL — ABNORMAL HIGH (ref 70–99)
Glucose-Capillary: 213 mg/dL — ABNORMAL HIGH (ref 70–99)
Glucose-Capillary: 129 mg/dL — ABNORMAL HIGH (ref 70–99)
Glucose-Capillary: 145 mg/dL — ABNORMAL HIGH (ref 70–99)
Glucose-Capillary: 165 mg/dL — ABNORMAL HIGH (ref 70–99)
Glucose-Capillary: 158 mg/dL — ABNORMAL HIGH (ref 70–99)

## 2019-04-23 LAB — RAPID URINE DRUG SCREEN, HOSP PERFORMED
Amphetamines: NOT DETECTED
Barbiturates: NOT DETECTED
Benzodiazepines: NOT DETECTED
Cocaine: NOT DETECTED
Opiates: NOT DETECTED
Tetrahydrocannabinol: POSITIVE — AB

## 2019-04-23 LAB — T4, FREE: Free T4: 1.15 ng/dL (ref 0.82–1.77)

## 2019-04-23 LAB — LIPASE, BLOOD: Lipase: 21 U/L (ref 11–51)

## 2019-04-23 LAB — SARS CORONAVIRUS 2 BY RT PCR (HOSPITAL ORDER, PERFORMED IN ~~LOC~~ HOSPITAL LAB): SARS Coronavirus 2: NEGATIVE

## 2019-04-23 LAB — I-STAT BETA HCG BLOOD, ED (MC, WL, AP ONLY): I-stat hCG, quantitative: <5 m[IU]/mL (ref <5)

## 2019-04-23 MED ORDER — ONDANSETRON HCL 4 MG/2ML IJ SOLN: 4.0000 mg | Freq: Four times a day (QID) | INTRAMUSCULAR | Status: DC | PRN

## 2019-04-23 MED ORDER — METOPROLOL SUCCINATE ER 25 MG PO TB24
25.0000 mg | ORAL_TABLET | Freq: Every day | ORAL | Status: DC
  Administered 2019-04-24: 25 mg via ORAL
  Filled 2019-04-23: qty 1

## 2019-04-23 MED ORDER — ROSUVASTATIN CALCIUM 20 MG PO TABS
40.0000 mg | ORAL_TABLET | Freq: Every day | ORAL | Status: DC
  Administered 2019-04-24: 40 mg via ORAL
  Filled 2019-04-23: qty 2

## 2019-04-23 MED ORDER — LORATADINE 10 MG PO TABS
10.0000 mg | ORAL_TABLET | Freq: Every day | ORAL | Status: DC
  Administered 2019-04-24: 10 mg via ORAL
  Filled 2019-04-23: qty 1

## 2019-04-23 MED ORDER — LACTATED RINGERS IV BOLUS
1000.0000 mL | Freq: Once | INTRAVENOUS | Status: AC
  Administered 2019-04-23: 1000 mL via INTRAVENOUS

## 2019-04-23 MED ORDER — PROMETHAZINE HCL 25 MG RE SUPP
25.0000 mg | Freq: Four times a day (QID) | RECTAL | Status: DC | PRN
  Filled 2019-04-23: qty 1

## 2019-04-23 MED ORDER — INSULIN REGULAR(HUMAN) IN NACL 100-0.9 UT/100ML-% IV SOLN
INTRAVENOUS | Status: DC
  Administered 2019-04-23: 3.4 [IU]/h via INTRAVENOUS

## 2019-04-23 MED ORDER — ENOXAPARIN SODIUM 40 MG/0.4ML ~~LOC~~ SOLN
40.0000 mg | SUBCUTANEOUS | Status: DC
  Administered 2019-04-23: 40 mg via SUBCUTANEOUS
  Filled 2019-04-23: qty 0.4

## 2019-04-23 MED ORDER — ONDANSETRON HCL 4 MG/2ML IJ SOLN
4.0000 mg | Freq: Once | INTRAMUSCULAR | Status: AC
  Administered 2019-04-23: 4 mg via INTRAVENOUS
  Filled 2019-04-23: qty 2

## 2019-04-23 MED ORDER — ASPIRIN 81 MG PO CHEW
81.0000 mg | CHEWABLE_TABLET | Freq: Every day | ORAL | Status: DC
  Administered 2019-04-24: 81 mg via ORAL
  Filled 2019-04-23: qty 1

## 2019-04-23 MED ORDER — PANTOPRAZOLE SODIUM 40 MG PO TBEC
40.0000 mg | DELAYED_RELEASE_TABLET | Freq: Every day | ORAL | Status: DC
  Administered 2019-04-24: 40 mg via ORAL
  Filled 2019-04-23: qty 1

## 2019-04-23 MED ORDER — SERTRALINE HCL 50 MG PO TABS
50.0000 mg | ORAL_TABLET | Freq: Every day | ORAL | Status: DC
  Administered 2019-04-24: 50 mg via ORAL
  Filled 2019-04-23: qty 1

## 2019-04-23 MED ORDER — INSULIN REGULAR(HUMAN) IN NACL 100-0.9 UT/100ML-% IV SOLN
INTRAVENOUS | Status: DC
  Filled 2019-04-23: qty 100

## 2019-04-23 MED ORDER — DEXTROSE-NACL 5-0.45 % IV SOLN
INTRAVENOUS | Status: DC
  Administered 2019-04-23 – 2019-04-24 (×2): via INTRAVENOUS

## 2019-04-23 MED ORDER — SODIUM CHLORIDE 0.9 % IV SOLN
INTRAVENOUS | Status: DC
  Administered 2019-04-23 – 2019-04-24 (×2): via INTRAVENOUS

## 2019-04-23 MED ORDER — METOCLOPRAMIDE HCL 5 MG/ML IJ SOLN
5.0000 mg | Freq: Four times a day (QID) | INTRAMUSCULAR | Status: DC
  Administered 2019-04-23 – 2019-04-24 (×3): 5 mg via INTRAVENOUS
  Filled 2019-04-23 (×3): qty 2

## 2019-04-23 MED ORDER — ACETAMINOPHEN 325 MG PO TABS: 650.0000 mg | ORAL_TABLET | Freq: Four times a day (QID) | ORAL | Status: DC | PRN

## 2019-04-23 MED ORDER — HYDRALAZINE HCL 20 MG/ML IJ SOLN: 5.0000 mg | INTRAMUSCULAR | Status: DC | PRN

## 2019-04-23 MED ORDER — DEXTROSE-NACL 5-0.45 % IV SOLN: INTRAVENOUS | Status: DC

## 2019-04-23 MED ORDER — EZETIMIBE 10 MG PO TABS
10.0000 mg | ORAL_TABLET | Freq: Every day | ORAL | Status: DC
  Administered 2019-04-24: 10 mg via ORAL
  Filled 2019-04-23: qty 1

## 2019-04-23 MED ORDER — SODIUM CHLORIDE 0.9 % IV SOLN: INTRAVENOUS | Status: DC

## 2019-04-23 MED ORDER — POTASSIUM CHLORIDE 10 MEQ/100ML IV SOLN
10.0000 meq | INTRAVENOUS | Status: AC
  Filled 2019-04-23: qty 100

## 2019-04-23 MED ORDER — SODIUM CHLORIDE 0.9 % IV SOLN: INTRAVENOUS | Status: AC

## 2019-04-23 MED ORDER — POTASSIUM CHLORIDE 10 MEQ/100ML IV SOLN
10.0000 meq | INTRAVENOUS | Status: DC
  Filled 2019-04-23: qty 100

## 2019-04-23 MED ORDER — SODIUM CHLORIDE 0.9% FLUSH
3.0000 mL | Freq: Once | INTRAVENOUS | Status: AC
  Administered 2019-04-23: 3 mL via INTRAVENOUS

## 2019-04-23 MED ORDER — LOSARTAN POTASSIUM 50 MG PO TABS
25.0000 mg | ORAL_TABLET | Freq: Every day | ORAL | Status: DC
  Administered 2019-04-24: 25 mg via ORAL
  Filled 2019-04-23: qty 1

## 2019-04-23 NOTE — Progress Notes (Addendum)
1630: Patient arrived to room 5W01. Insulin drip at 3.29ml/hr. VSS, outside of BP, BP elevated. No signs of distress. Healed and healing scabs to BLE.

## 2019-04-23 NOTE — ED Triage Notes (Signed)
Pt in from home via [facility], per report pt had acute onset of emesis today at 2am with 10 emesis episodes in the last 24 hrs, pt denies abd pain, pt reports x 1 diarrhea episode today, A&O x4

## 2019-04-23 NOTE — Plan of Care (Signed)
  Problem: Education: Goal: Knowledge of General Education information will improve Description Including pain rating scale, medication(s)/side effects and non-pharmacologic comfort measures Outcome: Progressing   

## 2019-04-23 NOTE — ED Notes (Signed)
Pt used bedpan, but fecal matter was present and unable to collect urine specimen.

## 2019-04-23 NOTE — ED Triage Notes (Signed)
Pt rcvd 4 mg Zofran PTA

## 2019-04-23 NOTE — ED Provider Notes (Signed)
[facility] EMERGENCY DEPARTMENT Provider Note   CSN: [id number] Arrival date & time: 04/23/19  1221    History   Chief Complaint Chief Complaint  Patient presents with  . Emesis    HPI  [name] [name] is a 58 y.o. female type I diabetic, cardiomyopathy ejection fraction 35%, chronic kidney disease stage II, presents complaining of nausea vomiting. Symptoms started this morning around 2am when she woke up with emesis.  Since then endorses 10 episodes of NBNB emesis and one episodes of diarrhea. Denies fevers or other symptoms. Endorses compliance with her insulin.     HPI  Past Medical History:  Diagnosis Date  . Anxiety   . B12 deficiency   . Chest pain 04/05/2017  . CHF (congestive heart failure) ([place])   . Diabetes mellitus type 1, uncontrolled ([place])    "dr. just changed me to type 1, uncontrolled" (11/26/2018)  . DKA (diabetic ketoacidoses) ([place]) 05/25/2018  . GERD (gastroesophageal reflux disease)   . Hyperlipidemia   . Hypertension    [name]  . Hypothyroidism   . IBS (irritable bowel syndrome)   . Intermittent vertigo   . 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 Leg cramping    "@ night" (09/18/2013)  . Migraine    "once q couple months" (11/26/2018)  . Osteoporosis   . Peripheral neuropathy   . Umbilical hernia    unrepaired (09/18/2013)    Patient Active Problem List   Diagnosis Date Noted  . Snoring 04/10/2019  . Medication management 04/10/2019  . Constipation   . Nausea and vomiting 03/25/2019  . DKA (diabetic ketoacidosis) ([place]) 11/26/2018  . SBO (small bowel obstruction) ([place]) 11/26/2018  . DKA (diabetic ketoacidoses) ([place]) 09/23/2018  . Tachycardia 08/04/2018  . MDD (major depressive disorder), single episode, mild ([place]) 07/01/2018  . Colitis 03/07/2018  . Chronic chest pain   . Chronic systolic CHF (congestive heart failure) ([place]) 01/14/2018  . Noncompliance 01/14/2018  . Migraines 12/11/2017  .  Chest discomfort 09/18/2017  . DKA, type 2 ([place]) 09/17/2017  . Phrygian cap 06/20/2017  . Leukocytosis   . Lower abdominal pain   . Colitis presumed infectious   . Nonischemic cardiomyopathy ([place]) 03/22/2017  . Hypokalemia 02/15/2017  . NSTEMI (non-ST elevated myocardial infarction) ([place]) 02/11/2017  . H. pylori infection 02/09/2017  . Hypomagnesemia 02/09/2017  . Gastroparesis due to DM ([place]) 01/22/2017  . Poorly controlled type 2 diabetes mellitus with peripheral neuropathy ([place]) 07/17/2016  . Atrial fibrillation ([place]) 02/01/2015  . 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 Hepatic lesion 09/18/2013  . Vitamin D deficiency 05/20/2012  . Essential hypertension 02/01/2011  . Osteoporosis 02/01/2011  . B12 deficiency 04/04/2010  . Peripheral neuropathy ([place]) 05/10/2009  . GERD 04/08/2009  . Generalized anxiety disorder 06/22/2008  . Hypothyroidism 04/29/2008  . Hyperlipidemia 04/29/2008  . Irritable bowel syndrome 04/29/2008    Past Surgical History:  Procedure Laterality Date  . CESAREAN SECTION  1989  . LAPAROSCOPIC ASSISTED VAGINAL HYSTERECTOMY  09/23/2012   Procedure: LAPAROSCOPIC ASSISTED VAGINAL HYSTERECTOMY;  Surgeon: Linda Hedges, DO;  Location: [facility];  Service: Gynecology;  Laterality: N/A;  pull Dr Gregor Hams instrument  . LEFT HEART CATH AND CORONARY ANGIOGRAPHY N/A 02/11/2017   Procedure: Left Heart Cath and Coronary Angiography;  Surgeon: Lorretta Harp, MD;  Location: [facility];  Service: Cardiovascular;  Laterality: N/A;  . TUBAL LIGATION Bilateral 1992     OB History  Gravida  2   Para  2   Term      Preterm      AB      Living        SAB      TAB      Ectopic      Multiple      Live Births               Home Medications    Prior to Admission medications   Medication Sig Start Date End Date Taking? Authorizing Provider  acetaminophen (TYLENOL) 325 MG tablet Take 650 mg by mouth every 6 (six) hours  as needed for mild pain, fever or headache.    [provider]  aspirin 81 MG chewable tablet Chew 1 tablet (81 mg total) by mouth daily. 02/12/17   Jani Gravel, MD  Continuous Blood Gluc Sensor (FREESTYLE LIBRE 14 DAY SENSOR) MISC by Does not apply route. 04/01/19   [provider]  ezetimibe (ZETIA) 10 MG tablet Take 1 tablet (10 mg total) by mouth daily. 04/02/19   Bedsole, Amy E, MD  fexofenadine (ALLEGRA) 180 MG tablet Take 1 tablet (180 mg total) by mouth daily. 04/02/19   Bedsole, Amy E, MD  furosemide (LASIX) 20 MG tablet Take 20 mg by mouth as needed for fluid. 01/03/19   [provider]  insulin lispro (HUMALOG KWIKPEN) 100 UNIT/ML KiwkPen Inject into skin 3 times a day at meal time according to the following sliding scale: CBG 70-120 - 0 units / CBG 121-150 - 1 unit / CBG 151-200 - 2 units / CBG 201-250 - 3 units / CBG 251-300 - 5 units / CBG 301-350 - 7 units / CBG 351-400+ - 9 units  YOU MUST FOLLOW A STRICT DIABETIC DIET Patient taking differently: Inject 0-9 Units into the skin 3 (three) times daily. Inject into skin 3 times a day at meal time according to the following sliding scale: CBG 70-120 - 0 units / CBG 121-150 - 1 unit / CBG 151-200 - 2 units / CBG 201-250 - 3 units / CBG 251-300 - 5 units / CBG 301-350 - 7 units / CBG 351-400+ - 9 units  YOU MUST FOLLOW A STRICT DIABETIC DIET 07/01/18   Bedsole, Amy E, MD  Insulin Pen Needle (PEN NEEDLES) 31G X 8 MM MISC Use to inject insulin 4 times a day.  Dx: E11.10 12/23/17   Jinny Sanders, MD  Lancets (ONETOUCH ULTRASOFT) lancets Use to check blood sugar daily as needed.  Dx: E11.10 12/23/17   Bedsole, Mervyn Gay, MD  levothyroxine (SYNTHROID, LEVOTHROID) 150 MCG tablet TAKE 1 TABLET BY MOUTH EVERY DAY Patient taking differently: Take 150 mcg by mouth daily before breakfast.  11/10/18   Bedsole, Amy E, MD  losartan (COZAAR) 25 MG tablet TAKE 1 TABLET BY MOUTH EVERY DAY Patient taking differently: Take 25 mg by  mouth daily.  03/17/19   Barrett, Evelene Croon, PA-C  metoCLOPramide (REGLAN) 5 MG tablet Take 1 tablet (5 mg total) by mouth 4 (four) times daily -  before meals and at bedtime. 04/14/19   Bedsole, Amy E, MD  metoprolol succinate (TOPROL-XL) 25 MG 24 hr tablet TAKE 1 TABLET BY MOUTH EVERY DAY Patient taking differently: Take 25 mg by mouth daily.  10/06/18   Minus Breeding, MD  nitroGLYCERIN (NITRODUR - DOSED IN MG/24 HR) 0.2 mg/hr patch APPLY 1 PATCH TO SKIN EVERY DAY AS NEEDED Patient taking differently: Place 0.2 mg onto the  skin daily.  08/27/18   Barrett, Evelene Croon, PA-C  ONETOUCH VERIO test strip USE TO CHECK BLOOD SUGAR DAILY AS NEEDED. DX: E11.10 09/21/18   Bedsole, Amy E, MD  pantoprazole (PROTONIX) 40 MG tablet Take 1 tablet (40 mg total) by mouth daily at 12 noon. Patient taking differently: Take 40 mg by mouth daily.  01/28/18   Bedsole, Amy E, MD  promethazine (PHENERGAN) 25 MG tablet TAKE 1 TABLET BY MOUTH EVERY 8 HOURS AS NEEDED FOR NAUSEA OR VOMITING. 04/01/19   Bedsole, Amy E, MD  rosuvastatin (CRESTOR) 40 MG tablet Take 1 tablet (40 mg total) by mouth daily. 04/10/19 07/09/19  Minus Breeding, MD  sertraline (ZOLOFT) 50 MG tablet TAKE 1 TABLET BY MOUTH EVERY DAY Patient taking differently: Take 50 mg by mouth daily.  12/19/18   Bedsole, Amy E, MD  TRESIBA FLEXTOUCH 100 UNIT/ML SOPN FlexTouch Pen Inject 0.24 mLs (24 Units total) into the skin at bedtime. On 10/20/18 take at lunch time, on 10/21/18 take at dinner time. On 10/22/18 resume at bedtime regimen. Patient taking differently: Inject 24 Units into the skin at bedtime.  10/20/18   Lavina Hamman, MD    Family History Family History  Problem Relation Age of Onset  . Diabetes Other   . Heart disease Other   . Heart failure Mother 69       Her heart skipped a beat and stopped - per pt  . Diabetes Maternal Grandmother   . Alzheimer's disease Maternal Grandmother   . Coronary artery disease Maternal Grandmother   . Heart attack  Maternal Grandfather   . Heart failure Brother     Social History Social History   Tobacco Use  . Smoking status: Former Smoker    Packs/day: 0.12    Years: 10.00    Pack years: 1.20    Types: Cigarettes    Last attempt to quit: 12/10/1990    Years since quitting: 28.3  . Smokeless tobacco: Never Used  Substance Use Topics  . Alcohol use: Never    Alcohol/week: 0.0 standard drinks    Frequency: Never  . Drug use: Yes    Types: Marijuana    Comment: last use 2 days ago     Allergies   Codeine   Review of Systems Review of Systems  Constitutional: Positive for appetite change. Negative for chills and fever.  HENT: Negative for ear pain and sore throat.   Eyes: Negative for pain and visual disturbance.  Respiratory: Negative for cough and shortness of breath.   Cardiovascular: Negative for chest pain and palpitations.  Gastrointestinal: Positive for diarrhea, nausea and vomiting.  Genitourinary: Negative for dysuria and hematuria.  Musculoskeletal: Negative for arthralgias and back pain.  Skin: Negative for color change and rash.  Neurological: Positive for headaches. Negative for seizures and syncope.  All other systems reviewed and are negative.    Physical Exam Updated Vital Signs BP 99/65 (BP Location: Right Arm)   Pulse (!) 101   Temp 98.9 F (37.2 C) (Oral)   Resp 11   Ht 4' 11.5" (1.511 m)   Wt 65.3 kg   LMP 08/11/2012   SpO2 99%   BMI 28.59 kg/m   Physical Exam Vitals signs and nursing note reviewed.  Constitutional:      General: She is not in acute distress.    Appearance: She is well-developed. She is ill-appearing.  HENT:     Head: Normocephalic and atraumatic.  Eyes:     Conjunctiva/sclera:  Conjunctivae normal.  Neck:     Musculoskeletal: Neck supple.  Cardiovascular:     Rate and Rhythm: Regular rhythm. Tachycardia present.     Heart sounds: No murmur.  Pulmonary:     Effort: Pulmonary effort is normal. No respiratory distress.      Breath sounds: Normal breath sounds.  Abdominal:     Palpations: Abdomen is soft.     Tenderness: There is no abdominal tenderness (generalized). There is no guarding.  Musculoskeletal:     Right lower leg: No edema.     Left lower leg: No edema.  Skin:    General: Skin is warm and dry.  Neurological:     Mental Status: She is alert.      ED Treatments / Results  Labs (all labs ordered are listed, but only abnormal results are displayed) Labs Reviewed  COMPREHENSIVE METABOLIC PANEL - Abnormal; Notable for the following components:      Result Value   CO2 16 (*)    Glucose, Bld 521 (*)    Anion gap 22 (*)    All other components within normal limits  CBC - Abnormal; Notable for the following components:   WBC 17.9 (*)    RBC 5.55 (*)    Hemoglobin 15.5 (*)    HCT 46.5 (*)    Platelets 455 (*)    All other components within normal limits  BASIC METABOLIC PANEL - Abnormal; Notable for the following components:   CO2 20 (*)    Glucose, Bld 426 (*)    Anion gap 18 (*)    All other components within normal limits  BASIC METABOLIC PANEL - Abnormal; Notable for the following components:   Potassium 3.2 (*)    Glucose, Bld 229 (*)    All other components within normal limits  RAPID URINE DRUG SCREEN, HOSP PERFORMED - Abnormal; Notable for the following components:   Tetrahydrocannabinol POSITIVE (*)    All other components within normal limits  GLUCOSE, CAPILLARY - Abnormal; Notable for the following components:   Glucose-Capillary 372 (*)    All other components within normal limits  TSH - Abnormal; Notable for the following components:   TSH 10.067 (*)    All other components within normal limits  GLUCOSE, CAPILLARY - Abnormal; Notable for the following components:   Glucose-Capillary 313 (*)    All other components within normal limits  GLUCOSE, CAPILLARY - Abnormal; Notable for the following components:   Glucose-Capillary 260 (*)    All other components within  normal limits  GLUCOSE, CAPILLARY - Abnormal; Notable for the following components:   Glucose-Capillary 213 (*)    All other components within normal limits  GLUCOSE, CAPILLARY - Abnormal; Notable for the following components:   Glucose-Capillary 129 (*)    All other components within normal limits  CBG MONITORING, ED - Abnormal; Notable for the following components:   Glucose-Capillary 519 (*)    All other components within normal limits  POCT I-STAT EG7 - Abnormal; Notable for the following components:   pCO2, Ven 32.9 (*)    pO2, Ven 68.0 (*)    Bicarbonate 18.5 (*)    TCO2 20 (*)    Acid-base deficit 6.0 (*)    Calcium, Ion 1.03 (*)    HCT 47.0 (*)    Hemoglobin 16.0 (*)    All other components within normal limits  CBG MONITORING, ED - Abnormal; Notable for the following components:   Glucose-Capillary 403 (*)    All other  components within normal limits  SARS CORONAVIRUS 2 (HOSPITAL ORDER, [facility])  LIPASE, BLOOD  T4, FREE  BASIC METABOLIC PANEL  BASIC METABOLIC PANEL  BASIC METABOLIC PANEL  I-STAT BETA HCG BLOOD, ED ([facility], [facility], [facility] ONLY)  I-STAT VENOUS BLOOD GAS, ED    EKG None  Radiology No results found.  Procedures Procedures (including critical care time)  Medications Ordered in ED Medications  aspirin chewable tablet 81 mg (has no administration in time range)  ezetimibe (ZETIA) tablet 10 mg (has no administration in time range)  losartan (COZAAR) tablet 25 mg (has no administration in time range)  metoprolol succinate (TOPROL-XL) 24 hr tablet 25 mg (has no administration in time range)  rosuvastatin (CRESTOR) tablet 40 mg (has no administration in time range)  sertraline (ZOLOFT) tablet 50 mg (has no administration in time range)  pantoprazole (PROTONIX) EC tablet 40 mg (has no administration in time range)  metoCLOPramide (REGLAN) injection 5 mg (5 mg Intravenous Given 04/23/19 1738)  loratadine (CLARITIN) tablet 10 mg (has no  administration in time range)  enoxaparin (LOVENOX) injection 40 mg (has no administration in time range)  0.9 %  sodium chloride infusion ( Intravenous Not Given 04/23/19 1454)  0.9 %  sodium chloride infusion ( Intravenous Rate/Dose Verify 04/23/19 1815)  dextrose 5 %-0.45 % sodium chloride infusion ( Intravenous Not Given 04/23/19 1915)  potassium chloride 10 mEq in 100 mL IVPB (10 mEq Intravenous Not Given 04/23/19 1452)  insulin regular, human (MYXREDLIN) 100 units/ 100 mL infusion ( Intravenous Rate/Dose Verify 04/23/19 1815)  acetaminophen (TYLENOL) tablet 650 mg (has no administration in time range)  ondansetron (ZOFRAN) injection 4 mg (has no administration in time range)  promethazine (PHENERGAN) suppository 25 mg (has no administration in time range)  hydrALAZINE (APRESOLINE) injection 5 mg (has no administration in time range)  sodium chloride flush (NS) 0.9 % injection 3 mL (3 mLs Intravenous Given 04/23/19 1300)  lactated ringers bolus 1,000 mL (0 mLs Intravenous Stopped 04/23/19 1452)  ondansetron (ZOFRAN) injection 4 mg (4 mg Intravenous Given 04/23/19 1449)     Initial Impression / Assessment and Plan / ED Course  I have reviewed the triage vital signs and the nursing notes.  Pertinent labs & imaging results that were available during my care of the patient were reviewed by me and considered in my medical decision making (see chart for details).       Patient presents with N,V and hyperglycemia consistent with prior DKA episodes.   On initial exam patient appears ill. VSS except for tachycardia in the 110s. Physical exam as above.  Glucose 519. LR bolus started and labs obtained that are consistent with DKA (K 3.7Bicarb 16, AG 22, normal pH).  Insulin drip and potassium repletion started as well as fluids. Patient admitted to hospitalist.   Final Clinical Impressions(s) / ED Diagnoses   Final diagnoses:  Diabetic ketoacidosis without coma associated with type 1 diabetes  [facility])    ED Discharge Orders    None       Doneta Public, MD 04/23/19 2020    Pattricia Boss, MD 04/24/19 1105

## 2019-04-23 NOTE — Progress Notes (Signed)
Patient continues on insulin gtt. Alert and oriented. CBG is 129, communicated with Dr. Hilbert Bible.  Dr. Hilbert Bible said pt's doctor wants the glucose stabilizer to continue until morning. Will continue to monitor.

## 2019-04-23 NOTE — H&P (Signed)
History and Physical    [name] [name] WJX:[id number] DOB: May 19, 1961 DOA: 04/23/2019  PCP: Jinny Sanders, MD Consultants:  [facility]- cardiology; [facility] Poli - endocrinology Patient coming from:  Home - lives with husband, son, brother; NOK: Husband, [phone]  Chief Complaint: Emesis  HPI: [name] [name] is a 58 y.o. female with medical history significant of stage 2 CKD; hypothyroidism; HTN; HLD; uncontrolled type 1 DM; and CHF presenting with emesis. She reports not feeling good while eating dinner last night.  About 2AM, she developed n/v.  She vomited more than 10 times.  She has also had a couple of loose stools.  No abdominal pain.  She did feel like the meatloaf she ate wasn't good.  She missed maybe 1 dose of insulin yesterday, the Humalog.  She did not take her [place] last night either.  Her sugars "have been running in the 100s."  No recent illness.  No sick contacts.  She was previously hospitalized with uncontrolled diabetes from 4/15-17 but not DKA, with A1c 11.6.  She last saw endocrinology via telemedicine visit on 4/22; she was to continue Tresiba 24 units daily and Humalog 8 units TID.  She was taken off Lasix.  She had a cardiology televisit on 5/1.  EF was 50% in February.  She was changed to Crestor 40 mg daily.  She is due for a sleep study due to possible OSA while hospitalized.  No fevers.  ED Course:   Multiple presentations with DKA now with similar symptoms.  N/V x 10, diarrhea x 1.  Endorses compliance with insulin.  Glucose 170 last night.  HCO3 16, gap 22.  Started on insulin drip with IVF.  K+ 3.7.  Review of Systems: As per HPI; otherwise review of systems reviewed and negative.   Ambulatory Status:   Ambulates without assistance  Past Medical History:  Diagnosis Date  . Anxiety   . B12 deficiency   . Chest pain 04/05/2017  . CHF (congestive heart failure) ([place])   . Diabetes mellitus type 1, uncontrolled ([place])    "dr. just changed me to type 1, uncontrolled"  (11/26/2018)  . DKA (diabetic ketoacidoses) ([place]) 05/25/2018  . GERD (gastroesophageal reflux disease)   . Hyperlipidemia   . Hypertension    [name]  . Hypothyroidism   . IBS (irritable bowel syndrome)   . Intermittent vertigo   . 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 Leg cramping    "@ night" (09/18/2013)  . Migraine    "once q couple months" (11/26/2018)  . Osteoporosis   . Peripheral neuropathy   . Umbilical hernia    unrepaired (09/18/2013)    Past Surgical History:  Procedure Laterality Date  . CESAREAN SECTION  1989  . LAPAROSCOPIC ASSISTED VAGINAL HYSTERECTOMY  09/23/2012   Procedure: LAPAROSCOPIC ASSISTED VAGINAL HYSTERECTOMY;  Surgeon: Linda Hedges, DO;  Location: [facility];  Service: Gynecology;  Laterality: N/A;  pull Dr Gregor Hams instrument  . LEFT HEART CATH AND CORONARY ANGIOGRAPHY N/A 02/11/2017   Procedure: Left Heart Cath and Coronary Angiography;  Surgeon: Lorretta Harp, MD;  Location: [facility];  Service: Cardiovascular;  Laterality: N/A;  . TUBAL LIGATION Bilateral 1992    Social History   Socioeconomic History  . Marital status: Married    Spouse name: Not on file  . Number of children: 2  . Years of education: Not on file  . Highest education level: Not on file  Occupational History  . Occupation: UNEMPLOYED  Employer: UNEMPLOYED    Comment: homemaker  Social Needs  . Financial resource strain: Not on file  . Food insecurity:    Worry: Not on file    Inability: Not on file  . Transportation needs:    Medical: Not on file    Non-medical: Not on file  Tobacco Use  . Smoking status: Former Smoker    Packs/day: 0.12    Years: 10.00    Pack years: 1.20    Types: Cigarettes    Last attempt to quit: 12/10/1990    Years since quitting: 28.3  . Smokeless tobacco: Never Used  Substance and Sexual Activity  . Alcohol use: Never    Alcohol/week: 0.0 standard drinks    Frequency: Never  . Drug use: Yes    Types:  Marijuana    Comment: last use 2 days ago  . Sexual activity: Not Currently  Lifestyle  . Physical activity:    Days per week: Not on file    Minutes per session: Not on file  . Stress: Not on file  Relationships  . Social connections:    Talks on phone: Not on file    Gets together: Not on file    Attends religious service: Not on file    Active member of club or organization: Not on file    Attends meetings of clubs or organizations: Not on file    Relationship status: Not on file  . Intimate partner violence:    Fear of current or ex partner: Not on file    Emotionally abused: Not on file    Physically abused: Not on file    Forced sexual activity: Not on file  Other Topics Concern  . Not on file  Social History Narrative   Regular exercise- no    Diet- lots of mountain dew, limits fast foods     Allergies  Allergen Reactions  . Codeine Hives, Anxiety and Other (See Comments)    Family History  Problem Relation Age of Onset  . Diabetes Other   . Heart disease Other   . Heart failure Mother 46       Her heart skipped a beat and stopped - per pt  . Diabetes Maternal Grandmother   . Alzheimer's disease Maternal Grandmother   . Coronary artery disease Maternal Grandmother   . Heart attack Maternal Grandfather   . Heart failure Brother     Prior to Admission medications   Medication Sig Start Date End Date Taking? Authorizing Provider  acetaminophen (TYLENOL) 325 MG tablet Take 650 mg by mouth every 6 (six) hours as needed for mild pain, fever or headache.    [provider]  aspirin 81 MG chewable tablet Chew 1 tablet (81 mg total) by mouth daily. 02/12/17   Jani Gravel, MD  Continuous Blood Gluc Sensor (FREESTYLE LIBRE 14 DAY SENSOR) MISC by Does not apply route. 04/01/19   [provider]  ezetimibe (ZETIA) 10 MG tablet Take 1 tablet (10 mg total) by mouth daily. 04/02/19   Bedsole, Amy E, MD  fexofenadine (ALLEGRA) 180 MG tablet Take 1 tablet (180 mg  total) by mouth daily. 04/02/19   Bedsole, Amy E, MD  furosemide (LASIX) 20 MG tablet Take 20 mg by mouth as needed for fluid. 01/03/19   [provider]  insulin lispro (HUMALOG KWIKPEN) 100 UNIT/ML KiwkPen Inject into skin 3 times a day at meal time according to the following sliding scale: CBG 70-120 - 0 units / CBG  121-150 - 1 unit / CBG 151-200 - 2 units / CBG 201-250 - 3 units / CBG 251-300 - 5 units / CBG 301-350 - 7 units / CBG 351-400+ - 9 units  YOU MUST FOLLOW A STRICT DIABETIC DIET Patient taking differently: Inject 0-9 Units into the skin 3 (three) times daily. Inject into skin 3 times a day at meal time according to the following sliding scale: CBG 70-120 - 0 units / CBG 121-150 - 1 unit / CBG 151-200 - 2 units / CBG 201-250 - 3 units / CBG 251-300 - 5 units / CBG 301-350 - 7 units / CBG 351-400+ - 9 units  YOU MUST FOLLOW A STRICT DIABETIC DIET 07/01/18   Bedsole, Amy E, MD  Insulin Pen Needle (PEN NEEDLES) 31G X 8 MM MISC Use to inject insulin 4 times a day.  Dx: E11.10 12/23/17   Jinny Sanders, MD  Lancets (ONETOUCH ULTRASOFT) lancets Use to check blood sugar daily as needed.  Dx: E11.10 12/23/17   Bedsole, Mervyn Gay, MD  levothyroxine (SYNTHROID, LEVOTHROID) 150 MCG tablet TAKE 1 TABLET BY MOUTH EVERY DAY Patient taking differently: Take 150 mcg by mouth daily before breakfast.  11/10/18   Bedsole, Amy E, MD  losartan (COZAAR) 25 MG tablet TAKE 1 TABLET BY MOUTH EVERY DAY Patient taking differently: Take 25 mg by mouth daily.  03/17/19   Barrett, Evelene Croon, PA-C  metoCLOPramide (REGLAN) 5 MG tablet Take 1 tablet (5 mg total) by mouth 4 (four) times daily -  before meals and at bedtime. 04/14/19   Bedsole, Amy E, MD  metoprolol succinate (TOPROL-XL) 25 MG 24 hr tablet TAKE 1 TABLET BY MOUTH EVERY DAY Patient taking differently: Take 25 mg by mouth daily.  10/06/18   Minus Breeding, MD  nitroGLYCERIN (NITRODUR - DOSED IN MG/24 HR) 0.2 mg/hr patch APPLY 1 PATCH TO SKIN EVERY  DAY AS NEEDED Patient taking differently: Place 0.2 mg onto the skin daily.  08/27/18   Barrett, Evelene Croon, PA-C  ONETOUCH VERIO test strip USE TO CHECK BLOOD SUGAR DAILY AS NEEDED. DX: E11.10 09/21/18   Bedsole, Amy E, MD  pantoprazole (PROTONIX) 40 MG tablet Take 1 tablet (40 mg total) by mouth daily at 12 noon. Patient taking differently: Take 40 mg by mouth daily.  01/28/18   Bedsole, Amy E, MD  promethazine (PHENERGAN) 25 MG tablet TAKE 1 TABLET BY MOUTH EVERY 8 HOURS AS NEEDED FOR NAUSEA OR VOMITING. 04/01/19   Bedsole, Amy E, MD  rosuvastatin (CRESTOR) 40 MG tablet Take 1 tablet (40 mg total) by mouth daily. 04/10/19 07/09/19  Minus Breeding, MD  sertraline (ZOLOFT) 50 MG tablet TAKE 1 TABLET BY MOUTH EVERY DAY Patient taking differently: Take 50 mg by mouth daily.  12/19/18   Bedsole, Amy E, MD  TRESIBA FLEXTOUCH 100 UNIT/ML SOPN FlexTouch Pen Inject 0.24 mLs (24 Units total) into the skin at bedtime. On 10/20/18 take at lunch time, on 10/21/18 take at dinner time. On 10/22/18 resume at bedtime regimen. Patient taking differently: Inject 24 Units into the skin at bedtime.  10/20/18   Lavina Hamman, MD    Physical Exam: Vitals:   04/23/19 1500 04/23/19 1515 04/23/19 1608 04/23/19 1647  BP: (!) 180/116 (!) 168/107 (!) 164/115   Pulse: (!) 124 (!) 120 (!) 118   Resp:   16   Temp:   98.6 F (37 C) 98.3 F (36.8 C)  TempSrc:    Oral  SpO2: 99% 99% 100%  Weight:    65.3 kg  Height:    4' 11.5" (1.511 m)     . General:  Appears calm and comfortable and is NAD but nauseated . Eyes:  PERRL, EOMI, normal lids, iris . ENT:  grossly normal hearing, lips & tongue, moderately dry mm; poor dentition . Neck:  no LAD, masses or thyromegaly . Cardiovascular:  RR with tachycardia, no m/r/g. No LE edema.  Marland Kitchen Respiratory:   CTA bilaterally with no wheezes/rales/rhonchi.  Normal respiratory effort. . Abdomen:  soft, mildly TTP is LLQ, ND, NABS . Skin:  no rash or induration seen on limited exam .  Musculoskeletal:  grossly normal tone BUE/BLE, good ROM, no bony abnormality . Lower extremity:  No LE edema.  Limited foot exam with no ulcerations.  2+ distal pulses. Marland Kitchen Psychiatric:  grossly normal mood and affect, speech fluent and appropriate, AOx3 . Neurologic:  CN 2-12 grossly intact, moves all extremities in coordinated fashion    Radiological Exams on Admission: No results found.  EKG: not done   Labs on Admission: I have personally reviewed the available labs and imaging studies at the time of the admission.  Pertinent labs:   VBG: 7.360/32.9/68.0/18.5 CO2 16 Glucose 521 Anion gap 22 WBC 17.9 Hgb 15.5 COVID negative HCG negative  Assessment/Plan Principal Problem:   DKA (diabetic ketoacidosis) ([name]) Active Problems:   Hypothyroidism   Essential hypertension   Kidney disease, chronic, stage 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)   Gastroparesis due to DM ([name])   Chronic systolic CHF (congestive heart failure) ([place])   DKA -Patient with poor baseline control, most recent A1c was 11.6 on 4/15 and she has had 4 admissions and 1 ER visit in the last 6 months -Suspect that this is related to a combination of gastroparesis and noncompliance -No indication of illness as source - although soul consider stool testing if her GI symptoms do not improve with treatment of DKA -Mild DKA on admission based on pH 7.360, HCO3 18.5, patient alert -Will observe in SDU with DKA protocol -Would recommend continuing insulin drip at least until morning regardless of rapidity of closure of gap and normalization of labs -K+ <5 and so potassium supplementation added -IVF at 150 cc/hr, NS until glucose <250 and then decrease rate to 125 and change to D51/2NS -Needs ongoing encouragement regarding improved compliance  Gastroparesis -Patient with acute on chronic n/v; also with 2 loose stools -Suspect gastroparesis and DKA as the cause, but if she has ongoing symptoms once DKA is resolved, consider stool  studies -Will change Reglan from PO to IV and order standing -Zofran as needed -Add prn PR Phenergan  Chronic systolic CHF -9/38 Echo with EF 55-60%, which was significantly improved from prior in 10/18 -Needs aggressive hydration due to DKA and dehydration -Will monitor for evidence of volume overload -Hold Lasix  Stage 2 CKD -Slightly increase creatinine compared to baseline but still normal -Will follow with rehydration  HTN -Resume Cozaar, Toprol XL tomorrow  -Add prn hydralazine for SBP >180  Hypothyroidism -Check TSH and free T4; Synthroid was adjusted last in March  -Continue Synthroid at current dose for now  HLD -Recently changed from Lipitor to Crestor; will continue      Note: This patient has been tested and is negative for the novel coronavirus COVID-19.  DVT prophylaxis: Lovenox Code Status:  Full - confirmed with patient Family Communication: None present  Disposition Plan:  Home once clinically improved Consults called: None  Admission status: It is my  clinical opinion that referral for OBSERVATION is reasonable and necessary in this patient based on the above information provided. The aforementioned taken together are felt to place the patient at high risk for further clinical deterioration. However it is anticipated that the patient may be medically stable for discharge from the hospital within 24 to 48 hours.    Karmen Bongo MD [facility]   How to contact the [facility] Attending or Consulting provider [place] or covering provider during after hours [place], for this patient?  1. Check the care team in [facility] and look for a) attending/consulting [facility] provider listed and b) the [facility] team listed 2. Log into [email or web address] and use [place] universal password to access. If you do not have the password, please contact the hospital operator. 3. Locate the [facility] provider you are looking for under [facility] and page to a number that you can be  directly reached. 4. If you still have difficulty reaching the provider, please page the [facility] (Director on Call) for the Hospitalists listed on amion for assistance.   04/23/2019, 5:01 PM

## 2019-04-24 DIAGNOSIS — E1143 Type 2 diabetes mellitus with diabetic autonomic (poly)neuropathy: Secondary | ICD-10-CM | POA: Diagnosis not present

## 2019-04-24 DIAGNOSIS — I5022 Chronic systolic (congestive) heart failure: Secondary | ICD-10-CM | POA: Diagnosis not present

## 2019-04-24 DIAGNOSIS — E1010 Type 1 diabetes mellitus with ketoacidosis without coma: Secondary | ICD-10-CM | POA: Diagnosis not present

## 2019-04-24 DIAGNOSIS — I10 Essential (primary) hypertension: Secondary | ICD-10-CM | POA: Diagnosis not present

## 2019-04-24 LAB — GLUCOSE, CAPILLARY
Glucose-Capillary: 135 mg/dL — ABNORMAL HIGH (ref 70–99)
Glucose-Capillary: 138 mg/dL — ABNORMAL HIGH (ref 70–99)
Glucose-Capillary: 142 mg/dL — ABNORMAL HIGH (ref 70–99)
Glucose-Capillary: 145 mg/dL — ABNORMAL HIGH (ref 70–99)
Glucose-Capillary: 149 mg/dL — ABNORMAL HIGH (ref 70–99)
Glucose-Capillary: 158 mg/dL — ABNORMAL HIGH (ref 70–99)
Glucose-Capillary: 159 mg/dL — ABNORMAL HIGH (ref 70–99)
Glucose-Capillary: 168 mg/dL — ABNORMAL HIGH (ref 70–99)
Glucose-Capillary: 181 mg/dL — ABNORMAL HIGH (ref 70–99)
Glucose-Capillary: 318 mg/dL — ABNORMAL HIGH (ref 70–99)

## 2019-04-24 LAB — BASIC METABOLIC PANEL
Anion gap: 10 (ref 5–15)
Anion gap: 7 (ref 5–15)
BUN: 10 mg/dL (ref 6–20)
BUN: 11 mg/dL (ref 6–20)
CO2: 23 mmol/L (ref 22–32)
CO2: 23 mmol/L (ref 22–32)
Calcium: 8.4 mg/dL — ABNORMAL LOW (ref 8.9–10.3)
Calcium: 8.8 mg/dL — ABNORMAL LOW (ref 8.9–10.3)
Chloride: 104 mmol/L (ref 98–111)
Chloride: 107 mmol/L (ref 98–111)
Creatinine, Ser: 0.52 mg/dL (ref 0.44–1.00)
Creatinine, Ser: 0.61 mg/dL (ref 0.44–1.00)
GFR calc Af Amer: >60 mL/min (ref >60)
GFR calc Af Amer: >60 mL/min (ref >60)
GFR calc non Af Amer: >60 mL/min (ref >60)
GFR calc non Af Amer: >60 mL/min (ref >60)
Glucose, Bld: 172 mg/dL — ABNORMAL HIGH (ref 70–99)
Glucose, Bld: 177 mg/dL — ABNORMAL HIGH (ref 70–99)
Potassium: 2.8 mmol/L — ABNORMAL LOW (ref 3.5–5.1)
Potassium: 4.3 mmol/L (ref 3.5–5.1)
Sodium: 137 mmol/L (ref 135–145)
Sodium: 137 mmol/L (ref 135–145)

## 2019-04-24 MED ORDER — INSULIN GLARGINE 100 UNIT/ML ~~LOC~~ SOLN
24.0000 [IU] | Freq: Every day | SUBCUTANEOUS | Status: DC
  Administered 2019-04-24: 24 [IU] via SUBCUTANEOUS
  Filled 2019-04-24 (×2): qty 0.24

## 2019-04-24 MED ORDER — POTASSIUM CHLORIDE 20 MEQ/15ML (10%) PO SOLN
40.0000 meq | Freq: Once | ORAL | Status: AC
  Administered 2019-04-24: 40 meq via ORAL
  Filled 2019-04-24: qty 30

## 2019-04-24 MED ORDER — SODIUM CHLORIDE 0.9 % IV SOLN: INTRAVENOUS | Status: DC

## 2019-04-24 MED ORDER — INSULIN ASPART 100 UNIT/ML ~~LOC~~ SOLN: 0.0000 [IU] | Freq: Every day | SUBCUTANEOUS | Status: DC

## 2019-04-24 MED ORDER — INSULIN ASPART 100 UNIT/ML ~~LOC~~ SOLN: 0.0000 [IU] | Freq: Three times a day (TID) | SUBCUTANEOUS | Status: DC

## 2019-04-24 NOTE — Plan of Care (Signed)
  Problem: Education: Goal: Knowledge of General Education information will improve Description: Including pain rating scale, medication(s)/side effects and non-pharmacologic comfort measures Outcome: Progressing   Problem: Health Behavior/Discharge Planning: Goal: Ability to identify and utilize available resources and services will improve Outcome: Progressing   

## 2019-04-24 NOTE — Progress Notes (Signed)
Inpatient Diabetes Program Recommendations  AACE/ADA: New Consensus Statement on Inpatient Glycemic Control (2015)  Target Ranges:  Prepandial:   less than 140 mg/dL      Peak postprandial:   less than 180 mg/dL (1-2 hours)      Critically ill patients:  140 - 180 mg/dL   Lab Results  Component Value Date   GLUCAP 142 (H) 04/24/2019   HGBA1C 11.6 (H) 03/25/2019    Review of Glycemic Control Results for [name] [name], [name] [name] (MRN [id number]) as of 04/24/2019 11:25  Ref. Range 04/24/2019 03:30 04/24/2019 04:26 04/24/2019 05:27 04/24/2019 06:25 04/24/2019 07:36  Glucose-Capillary Latest Ref Range: 70 - 99 mg/dL 168 (H) 159 (H) 158 (H) 181 (H) 142 (H)   Diabetes history: DM1 Outpatient Diabetes medications: Tresiba 24 units + Humalog meal coverage + correction per sliding scale tid Current orders for Inpatient glycemic control: Lantus 24 units + Novolog sensitive correction tid + hs 0-5 units  Inpatient Diabetes Program Recommendations:   DM coordinator spoke with patient on last admission with DKA  03-27-19 and A1c had decreased from 13.2 to 11.6.  Reviewed with patient importance of taking basal insulin regularly and sick day rules. Patient saw her endocrinologist Dr. [place] Poli with [facility] 04-01-19 on telemed after D/C from hospital.  -Add Novolog 3 units tid meal coverage when patient is eating 50% meals  Thank you, [name] [name]. [name] Huizenga, RN, MSN, CDE  Diabetes Coordinator Inpatient Glycemic Control Team Team Pager [phone] (8am-5pm) 04/24/2019 11:31 AM

## 2019-04-24 NOTE — Discharge Instructions (Signed)
Follow with Jinny Sanders, MD in 5-7 days  Please get a complete blood count and chemistry panel checked by your Primary MD at your next visit, and again as instructed by your Primary MD. Please get your medications reviewed and adjusted by your Primary MD.  Please request your Primary MD to go over all Hospital Tests and Procedure/Radiological results at the follow up, please get all Hospital records sent to your Prim MD by signing hospital release before you go home.  In some cases, there will be blood work, cultures and biopsy results pending at the time of your discharge. Please request that your primary care M.D. goes through all the records of your hospital data and follows up on these results.  If you had Pneumonia of Lung problems at the Hospital: Please get a 2 view Chest X ray done in 6-8 weeks after hospital discharge or sooner if instructed by your Primary MD.  If you have Congestive Heart Failure: Please call your Cardiologist or Primary MD anytime you have any of the following symptoms:  1) 3 pound weight gain in 24 hours or 5 pounds in 1 week  2) shortness of breath, with or without a dry hacking cough  3) swelling in the hands, feet or stomach  4) if you have to sleep on extra pillows at night in order to breathe  Follow cardiac low salt diet and 1.5 lit/day fluid restriction.  If you have diabetes Accuchecks 4 times/day, Once in AM empty stomach and then before each meal. Log in all results and show them to your primary doctor at your next visit. If any glucose reading is under 80 or above 300 call your primary MD immediately.  If you have Seizure/Convulsions/Epilepsy: Please do not drive, operate heavy machinery, participate in activities at heights or participate in high speed sports until you have seen by Primary MD or a Neurologist and advised to do so again.  If you had Gastrointestinal Bleeding: Please ask your Primary MD to check a complete blood count within one  week of discharge or at your next visit. Your endoscopic/colonoscopic biopsies that are pending at the time of discharge, will also need to followed by your Primary MD.  Get Medicines reviewed and adjusted. Please take all your medications with you for your next visit with your Primary MD  Please request your Primary MD to go over all hospital tests and procedure/radiological results at the follow up, please ask your Primary MD to get all Hospital records sent to his/her office.  If you experience worsening of your admission symptoms, develop shortness of breath, life threatening emergency, suicidal or homicidal thoughts you must seek medical attention immediately by calling 911 or calling your MD immediately  if symptoms less severe.  You must read complete instructions/literature along with all the possible adverse reactions/side effects for all the Medicines you take and that have been prescribed to you. Take any new Medicines after you have completely understood and accpet all the possible adverse reactions/side effects.   Do not drive or operate heavy machinery when taking Pain medications.   Do not take more than prescribed Pain, Sleep and Anxiety Medications  Special Instructions: If you have smoked or chewed Tobacco  in the last 2 yrs please stop smoking, stop any regular Alcohol  and or any Recreational drug use.  Wear Seat belts while driving.  Please note You were cared for by a hospitalist during your hospital stay. If you have any questions about your  discharge medications or the care you received while you were in the hospital after you are discharged, you can call the unit and asked to speak with the hospitalist on call if the hospitalist that took care of you is not available. Once you are discharged, your primary care physician will handle any further medical issues. Please note that NO REFILLS for any discharge medications will be authorized once you are discharged, as it is  imperative that you return to your primary care physician (or establish a relationship with a primary care physician if you do not have one) for your aftercare needs so that they can reassess your need for medications and monitor your lab values.  You can reach the hospitalist office at phone [phone] or fax [phone]   If you do not have a primary care physician, you can call [phone] for a physician referral.  Activity: As tolerated with Full fall precautions use walker/cane & assistance as needed    Diet: diabetic  Disposition Home

## 2019-04-24 NOTE — Progress Notes (Signed)
Insulin drip discontinued per order with second nurse verifier. Pt informed of plan. IVF changed and given menu to order meal.

## 2019-04-24 NOTE — Progress Notes (Signed)
CBG 158. No rate/dose change. Insulin gtt continues at 2unit/hr.

## 2019-04-24 NOTE — Discharge Summary (Signed)
Physician Discharge Summary  [name] [name] LTJ:[id number] DOB: 1961-10-23 DOA: 04/23/2019  PCP: Jinny Sanders, MD  Admit date: 04/23/2019 Discharge date: 04/24/2019  Admitted From: home Disposition:  home  Recommendations for Outpatient Follow-up:  1. Follow up with PCP in 1-2 weeks  Home Health: none Equipment/Devices: none  Discharge Condition: stable CODE STATUS: Full code Diet recommendation: diabetic  HPI: Per admitting MD, [name] [name] is a 58 y.o. female with medical history significant of stage 2 CKD; hypothyroidism; HTN; HLD; uncontrolled type 1 DM; and CHF presenting with emesis. She reports not feeling good while eating dinner last night.  About 2AM, she developed n/v.  She vomited more than 10 times.  She has also had a couple of loose stools.  No abdominal pain.  She did feel like the meatloaf she ate wasn't good.  She missed maybe 1 dose of insulin yesterday, the Humalog.  She did not take her [place] last night either.  Her sugars "have been running in the 100s."  No recent illness.  No sick contacts. She was previously hospitalized with uncontrolled diabetes from 4/15-17 but not DKA, with A1c 11.6.  She last saw endocrinology via telemedicine visit on 4/22; she was to continue Tresiba 24 units daily and Humalog 8 units TID.  She was taken off Lasix.  She had a cardiology televisit on 5/1.  EF was 50% in February.  She was changed to Crestor 40 mg daily.  She is due for a sleep study due to possible OSA while hospitalized.  No fevers. ED Course:   Multiple presentations with DKA now with similar symptoms.  N/V x 10, diarrhea x 1.  Endorses compliance with insulin.  Glucose 170 last night.  HCO3 16, gap 22.  Started on insulin drip with IVF.  K+ 3.7.  Hospital Course: DKA in the setting of poorly controlled diabetes mellitus with hyperglycemia-patient was admitted to DKA, mild, likely in the setting of noncompliance.  She has multiple admissions with DKA and most recent A1c  was 11.6.  She reports missing some of her NovoLog insulin due to stressors in the family.  She was placed on insulin infusion, her gap is closed, her insulin has improved, she was transitioned to subcutaneous and was allowed to eat.  She is tolerating a regular diet without difficulties, will be discharged home in stable condition. Gastroparesis-in the setting of DKA, resolved, able to tolerate diet without difficulties Chronic systolic CHF-most recent echo with EF 55-60%, significantly improved, remains euvolemic and tolerated fluids well Stage II chronic kidney disease-creatinine at baseline Hypertension-resume home medications Hyperlipidemia-resume home medications  Discharge Diagnoses:  Principal Problem:   DKA (diabetic ketoacidosis) ([place]) Active Problems:   Hypothyroidism   Essential hypertension   Kidney disease, chronic, stage 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)   Gastroparesis due to DM ([name])   Chronic systolic CHF (congestive heart failure) ([place])     Discharge Instructions   Allergies as of 04/24/2019      Reactions   Codeine Hives, Anxiety, Other (See Comments)      Medication List    TAKE these medications   acetaminophen 325 MG tablet Commonly known as:  TYLENOL Take 650 mg by mouth every 6 (six) hours as needed for mild pain, fever or headache.   aspirin 81 MG chewable tablet Chew 1 tablet (81 mg total) by mouth daily.   ezetimibe 10 MG tablet Commonly known as:  ZETIA Take 1 tablet (10 mg total) by mouth daily.   fexofenadine  180 MG tablet Commonly known as:  ALLEGRA Take 1 tablet (180 mg total) by mouth daily.   FreeStyle Libre 14 Day Sensor Misc by Does not apply route.   furosemide 20 MG tablet Commonly known as:  LASIX Take 20 mg by mouth as needed for fluid.   insulin lispro 100 UNIT/ML KiwkPen Commonly known as:  HumaLOG KwikPen Inject into skin 3 times a day at meal time according to the following sliding scale: CBG 70-120 - 0 units / CBG 121-150 - 1  unit / CBG 151-200 - 2 units / CBG 201-250 - 3 units / CBG 251-300 - 5 units / CBG 301-350 - 7 units / CBG 351-400+ - 9 units  YOU MUST FOLLOW A STRICT DIABETIC DIET What changed:    how much to take  how to take this  when to take this   levothyroxine 150 MCG tablet Commonly known as:  SYNTHROID TAKE 1 TABLET BY MOUTH EVERY DAY What changed:  when to take this   losartan 25 MG tablet Commonly known as:  COZAAR TAKE 1 TABLET BY MOUTH EVERY DAY   metoCLOPramide 5 MG tablet Commonly known as:  REGLAN Take 1 tablet (5 mg total) by mouth 4 (four) times daily -  before meals and at bedtime.   metoprolol succinate 25 MG 24 hr tablet Commonly known as:  TOPROL-XL TAKE 1 TABLET BY MOUTH EVERY DAY   nitroGLYCERIN 0.2 mg/hr patch Commonly known as:  NITRODUR - Dosed in mg/24 hr APPLY 1 PATCH TO SKIN EVERY DAY AS NEEDED What changed:  See the new instructions.   onetouch ultrasoft lancets Use to check blood sugar daily as needed.  Dx: E11.10   OneTouch Verio test strip Generic drug:  glucose blood USE TO CHECK BLOOD SUGAR DAILY AS NEEDED. DX: E11.10   pantoprazole 40 MG tablet Commonly known as:  PROTONIX Take 1 tablet (40 mg total) by mouth daily at 12 noon. What changed:  when to take this   Pen Needles 31G X 8 MM Misc Use to inject insulin 4 times a day.  Dx: E11.10   promethazine 25 MG tablet Commonly known as:  PHENERGAN TAKE 1 TABLET BY MOUTH EVERY 8 HOURS AS NEEDED FOR NAUSEA OR VOMITING.   rosuvastatin 40 MG tablet Commonly known as:  CRESTOR Take 1 tablet (40 mg total) by mouth daily.   sertraline 50 MG tablet Commonly known as:  ZOLOFT TAKE 1 TABLET BY MOUTH EVERY DAY   Tresiba FlexTouch 100 UNIT/ML Sopn FlexTouch Pen Generic drug:  insulin degludec Inject 0.24 mLs (24 Units total) into the skin at bedtime. On 10/20/18 take at lunch time, on 10/21/18 take at dinner time. On 10/22/18 resume at bedtime regimen. What changed:  additional instructions       Follow-up Information    Bedsole, Amy E, MD. Schedule an appointment as soon as possible for a visit in 1 week(s).   Specialty:  Family Medicine Contact information: [place] [address] [phone]        Minus Breeding, MD .   Specialty:  Cardiology Contact information: [address] [place] [address] [phone]           Consultations:  none  Procedures/Studies:  none   No results found.   Subjective: - no chest pain, shortness of breath, no abdominal pain, nausea or vomiting.    Discharge Exam: BP 110/76 (BP Location: Right Arm)   Pulse 98   Temp  97.8 F (36.6 C) (Oral)   Resp 20   Ht 4' 11.5" (1.511 m)   Wt 65.3 kg   LMP 08/11/2012   SpO2 100%   BMI 28.59 kg/m   General: Pt is alert, awake, not in acute distress Cardiovascular: RRR, S1/S2 +, no rubs, no gallops Respiratory: CTA bilaterally, no wheezing, no rhonchi Abdominal: Soft, NT, ND, bowel sounds + Extremities: no edema, no cyanosis    The results of significant diagnostics from this hospitalization (including imaging, microbiology, ancillary and laboratory) are listed below for reference.     Microbiology: Recent Results (from the past 240 hour(s))  SARS Coronavirus 2 (CEPHEID - Performed in [place] hospital lab), Hosp Order     Status: None   Collection Time: 04/23/19  1:05 PM  Result Value Ref Range Status   SARS Coronavirus 2 NEGATIVE NEGATIVE Final    Comment: (NOTE) If result is NEGATIVE SARS-CoV-2 target nucleic acids are NOT DETECTED. The SARS-CoV-2 RNA is generally detectable in upper and lower  respiratory specimens during the acute phase of infection. The lowest  concentration of SARS-CoV-2 viral copies this assay can detect is 250  copies / mL. A negative result does not preclude SARS-CoV-2 infection  and should not be used as the sole basis for treatment or other  patient management decisions.  A negative result  may occur with  improper specimen collection / handling, submission of specimen other  than nasopharyngeal swab, presence of viral mutation(s) within the  areas targeted by this assay, and inadequate number of viral copies  (<250 copies / mL). A negative result must be combined with clinical  observations, patient history, and epidemiological information. If result is POSITIVE SARS-CoV-2 target nucleic acids are DETECTED. The SARS-CoV-2 RNA is generally detectable in upper and lower  respiratory specimens dur ing the acute phase of infection.  Positive  results are indicative of active infection with SARS-CoV-2.  Clinical  correlation with patient history and other diagnostic information is  necessary to determine patient infection status.  Positive results do  not rule out bacterial infection or co-infection with other viruses. If result is PRESUMPTIVE POSTIVE SARS-CoV-2 nucleic acids MAY BE PRESENT.   A presumptive positive result was obtained on the submitted specimen  and confirmed on repeat testing.  While 2019 novel coronavirus  (SARS-CoV-2) nucleic acids may be present in the submitted sample  additional confirmatory testing may be necessary for epidemiological  and / or clinical management purposes  to differentiate between  SARS-CoV-2 and other Sarbecovirus currently known to infect humans.  If clinically indicated additional testing with an alternate test  methodology [phone]) is advised. The SARS-CoV-2 RNA is generally  detectable in upper and lower respiratory sp ecimens during the acute  phase of infection. The expected result is Negative. Fact Sheet for Patients:  StrictlyIdeas.no Fact Sheet for Healthcare Providers: BankingDealers.co.za This test is not yet approved or cleared by the [place] FDA and has been authorized for detection and/or diagnosis of SARS-CoV-2 by FDA under an Emergency Use Authorization (EUA).   This EUA will remain in effect (meaning this test can be used) for the duration of the COVID-19 declaration under Section 564(b)(1) of the Act, 21 U.S.C. section 360bbb-3(b)(1), unless the authorization is terminated or revoked sooner. Performed at [facility], [address], [place], [place] [address]      Labs: BNP (last 3 results) No results for input(s): BNP in the last 8760 hours. Basic Metabolic Panel: Recent Labs  Lab  04/23/19 1240 04/23/19 1249 04/23/19 1450 04/23/19 1843 04/23/19 2337 04/24/19 0310  NA 136 135 137 139 137 137  K 3.7 3.8 3.7 3.2* 2.8* 4.3  CL 98  --  99 103 104 107  CO2 16*  --  20* 22 23 23   GLUCOSE 521*  --  426* 229* 172* 177*  BUN 11  --  10 9 11 10   CREATININE 0.99  --  0.98 0.62 0.61 0.52  CALCIUM 9.4  --  9.3 9.1 8.8* 8.4*   Liver Function Tests: Recent Labs  Lab 04/23/19 1240  AST 17  ALT 13  ALKPHOS 94  BILITOT 0.9  PROT 7.6  ALBUMIN 4.2   Recent Labs  Lab 04/23/19 1240  LIPASE 21   No results for input(s): AMMONIA in the last 168 hours. CBC: Recent Labs  Lab 04/23/19 1240 04/23/19 1249  WBC 17.9*  --   HGB 15.5* 16.0*  HCT 46.5* 47.0*  MCV 83.8  --   PLT 455*  --    Cardiac Enzymes: No results for input(s): CKTOTAL, CKMB, CKMBINDEX, TROPONINI in the last 168 hours. BNP: Invalid input(s): POCBNP CBG: Recent Labs  Lab 04/24/19 0330 04/24/19 0426 04/24/19 0527 04/24/19 0625 04/24/19 0736  GLUCAP 168* 159* 158* 181* 142*   D-Dimer No results for input(s): DDIMER in the last 72 hours. Hgb A1c No results for input(s): HGBA1C in the last 72 hours. Lipid Profile No results for input(s): CHOL, HDL, LDLCALC, TRIG, CHOLHDL, LDLDIRECT in the last 72 hours. Thyroid function studies Recent Labs    04/23/19 1843  TSH 10.067*   Anemia work up No results for input(s): VITAMINB12, FOLATE, FERRITIN, TIBC, IRON, RETICCTPCT in the last 72 hours. Urinalysis    Component Value Date/Time   COLORURINE STRAW  (A) 03/26/2019 0408   APPEARANCEUR CLEAR 03/26/2019 0408   LABSPEC 1.010 03/26/2019 0408   PHURINE 9.0 (H) 03/26/2019 0408   GLUCOSEU >=500 (A) 03/26/2019 0408   HGBUR NEGATIVE 03/26/2019 0408   BILIRUBINUR NEGATIVE 03/26/2019 0408   BILIRUBINUR negative 09/11/2016 1038   KETONESUR 20 (A) 03/26/2019 0408   PROTEINUR 30 (A) 03/26/2019 0408   UROBILINOGEN 0.2 09/11/2016 1038   UROBILINOGEN 0.2 10/28/2013 1723   NITRITE NEGATIVE 03/26/2019 0408   LEUKOCYTESUR NEGATIVE 03/26/2019 0408   Sepsis Labs Invalid input(s): PROCALCITONIN,  WBC,  LACTICIDVEN  FURTHER DISCHARGE INSTRUCTIONS:   Get Medicines reviewed and adjusted: Please take all your medications with you for your next visit with your Primary MD   Laboratory/radiological data: Please request your Primary MD to go over all hospital tests and procedure/radiological results at the follow up, please ask your Primary MD to get all Hospital records sent to his/her office.   In some cases, they will be blood work, cultures and biopsy results pending at the time of your discharge. Please request that your primary care M.D. goes through all the records of your hospital data and follows up on these results.   Also Note the following: If you experience worsening of your admission symptoms, develop shortness of breath, life threatening emergency, suicidal or homicidal thoughts you must seek medical attention immediately by calling 911 or calling your MD immediately  if symptoms less severe.   You must read complete instructions/literature along with all the possible adverse reactions/side effects for all the Medicines you take and that have been prescribed to you. Take any new Medicines after you have completely understood and accpet all the possible adverse reactions/side effects.    Do not  drive when taking Pain medications or sleeping medications (Benzodaizepines)   Do not take more than prescribed Pain, Sleep and Anxiety Medications. It  is not advisable to combine anxiety,sleep and pain medications without talking with your primary care practitioner   Special Instructions: If you have smoked or chewed Tobacco  in the last 2 yrs please stop smoking, stop any regular Alcohol  and or any Recreational drug use.   Wear Seat belts while driving.   Please note: You were cared for by a hospitalist during your hospital stay. Once you are discharged, your primary care physician will handle any further medical issues. Please note that NO REFILLS for any discharge medications will be authorized once you are discharged, as it is imperative that you return to your primary care physician (or establish a relationship with a primary care physician if you do not have one) for your post hospital discharge needs so that they can reassess your need for medications and monitor your lab values.  Time coordinating discharge: 35 minutes  SIGNED:  Marzetta Board, MD, PhD 04/24/2019, 1:34 PM

## 2019-04-24 NOTE — Plan of Care (Signed)
Patient continues on insulin gtt through the night per doctor's order. Blood glucose within range. Patient is alert and oriented. BMP monitored, K+ 2.8 replaced per order. Will continue to monitor.

## 2019-04-24 NOTE — Progress Notes (Signed)
Verified rate with off-going nurse at 3.6u/hr. Current CBG 142 and rate decreased to 2.5u/hr. Pt informed and tolerating well.

## 2019-04-25 ENCOUNTER — Encounter (HOSPITAL_COMMUNITY): Payer: Self-pay

## 2019-04-25 ENCOUNTER — Emergency Department (HOSPITAL_COMMUNITY)
Admission: EM | Admit: 2019-04-25 | Discharge: 2019-04-25 | Disposition: A | Discharge status: Home / Self Care | Payer: 59 | Attending: Emergency Medicine | Admitting: Emergency Medicine

## 2019-04-25 ENCOUNTER — Other Ambulatory Visit: Payer: Self-pay

## 2019-04-25 DIAGNOSIS — Z87891 Personal history of nicotine dependence: Secondary | ICD-10-CM | POA: Insufficient documentation

## 2019-04-25 DIAGNOSIS — R110 Nausea: Secondary | ICD-10-CM | POA: Diagnosis not present

## 2019-04-25 DIAGNOSIS — I5022 Chronic systolic (congestive) heart failure: Secondary | ICD-10-CM | POA: Insufficient documentation

## 2019-04-25 DIAGNOSIS — E1165 Type 2 diabetes mellitus with hyperglycemia: Secondary | ICD-10-CM | POA: Diagnosis not present

## 2019-04-25 DIAGNOSIS — I252 Old myocardial infarction: Secondary | ICD-10-CM | POA: Diagnosis not present

## 2019-04-25 DIAGNOSIS — Z794 Long term (current) use of insulin: Secondary | ICD-10-CM | POA: Insufficient documentation

## 2019-04-25 DIAGNOSIS — Z7982 Long term (current) use of aspirin: Secondary | ICD-10-CM | POA: Insufficient documentation

## 2019-04-25 DIAGNOSIS — I110 Hypertensive heart disease with heart failure: Secondary | ICD-10-CM | POA: Insufficient documentation

## 2019-04-25 DIAGNOSIS — E039 Hypothyroidism, unspecified: Secondary | ICD-10-CM | POA: Diagnosis not present

## 2019-04-25 DIAGNOSIS — E1065 Type 1 diabetes mellitus with hyperglycemia: Secondary | ICD-10-CM | POA: Insufficient documentation

## 2019-04-25 DIAGNOSIS — R739 Hyperglycemia, unspecified: Secondary | ICD-10-CM

## 2019-04-25 DIAGNOSIS — Z79899 Other long term (current) drug therapy: Secondary | ICD-10-CM | POA: Diagnosis not present

## 2019-04-25 DIAGNOSIS — R1111 Vomiting without nausea: Secondary | ICD-10-CM | POA: Diagnosis not present

## 2019-04-25 LAB — COMPREHENSIVE METABOLIC PANEL
ALT: 13 U/L (ref 0–44)
AST: 17 U/L (ref 15–41)
Albumin: 3.9 g/dL (ref 3.5–5.0)
Alkaline Phosphatase: 86 U/L (ref 38–126)
Anion gap: 20 — ABNORMAL HIGH (ref 5–15)
BUN: 12 mg/dL (ref 6–20)
CO2: 20 mmol/L — ABNORMAL LOW (ref 22–32)
Calcium: 9.1 mg/dL (ref 8.9–10.3)
Chloride: 96 mmol/L — ABNORMAL LOW (ref 98–111)
Creatinine, Ser: 0.85 mg/dL (ref 0.44–1.00)
GFR calc Af Amer: >60 mL/min (ref >60)
GFR calc non Af Amer: >60 mL/min (ref >60)
Glucose, Bld: 354 mg/dL — ABNORMAL HIGH (ref 70–99)
Potassium: 3.6 mmol/L (ref 3.5–5.1)
Sodium: 136 mmol/L (ref 135–145)
Total Bilirubin: 1.6 mg/dL — ABNORMAL HIGH (ref 0.3–1.2)
Total Protein: 7.2 g/dL (ref 6.5–8.1)

## 2019-04-25 LAB — BASIC METABOLIC PANEL
Anion gap: 15 (ref 5–15)
BUN: 15 mg/dL (ref 6–20)
CO2: 21 mmol/L — ABNORMAL LOW (ref 22–32)
Calcium: 8.6 mg/dL — ABNORMAL LOW (ref 8.9–10.3)
Chloride: 99 mmol/L (ref 98–111)
Creatinine, Ser: 0.63 mg/dL (ref 0.44–1.00)
GFR calc Af Amer: >60 mL/min (ref >60)
GFR calc non Af Amer: >60 mL/min (ref >60)
Glucose, Bld: 221 mg/dL — ABNORMAL HIGH (ref 70–99)
Potassium: 3.7 mmol/L (ref 3.5–5.1)
Sodium: 135 mmol/L (ref 135–145)

## 2019-04-25 LAB — CBC
HCT: 47.1 % — ABNORMAL HIGH (ref 36.0–46.0)
Hemoglobin: 16 g/dL — ABNORMAL HIGH (ref 12.0–15.0)
MCH: 28 pg (ref 26.0–34.0)
MCHC: 34 g/dL (ref 30.0–36.0)
MCV: 82.5 fL (ref 80.0–100.0)
Platelets: 415 10*3/uL — ABNORMAL HIGH (ref 150–400)
RBC: 5.71 MIL/uL — ABNORMAL HIGH (ref 3.87–5.11)
RDW: 15.5 % (ref 11.5–15.5)
WBC: 19.1 10*3/uL — ABNORMAL HIGH (ref 4.0–10.5)
nRBC: 0 % (ref 0.0–0.2)

## 2019-04-25 LAB — POCT I-STAT EG7
Acid-Base Excess: 2 mmol/L (ref 0.0–2.0)
Bicarbonate: 25.1 mmol/L (ref 20.0–28.0)
Calcium, Ion: 1.05 mmol/L — ABNORMAL LOW (ref 1.15–1.40)
HCT: 48 % — ABNORMAL HIGH (ref 36.0–46.0)
Hemoglobin: 16.3 g/dL — ABNORMAL HIGH (ref 12.0–15.0)
O2 Saturation: 100 %
Potassium: 3.6 mmol/L (ref 3.5–5.1)
Sodium: 135 mmol/L (ref 135–145)
TCO2: 26 mmol/L (ref 22–32)
pCO2, Ven: 34 mmHg — ABNORMAL LOW (ref 44.0–60.0)
pH, Ven: 7.476 — ABNORMAL HIGH (ref 7.250–7.430)
pO2, Ven: 181 mmHg — ABNORMAL HIGH (ref 32.0–45.0)

## 2019-04-25 LAB — URINALYSIS, ROUTINE W REFLEX MICROSCOPIC
Bilirubin Urine: NEGATIVE
Glucose, UA: >=500 mg/dL — AB
Hgb urine dipstick: NEGATIVE
Ketones, ur: 80 mg/dL — AB
Leukocytes,Ua: NEGATIVE
Nitrite: NEGATIVE
Protein, ur: 100 mg/dL — AB
Specific Gravity, Urine: 1.02 (ref 1.005–1.030)
pH: 7 (ref 5.0–8.0)

## 2019-04-25 LAB — CBG MONITORING, ED
Glucose-Capillary: 333 mg/dL — ABNORMAL HIGH (ref 70–99)
Glucose-Capillary: 224 mg/dL — ABNORMAL HIGH (ref 70–99)

## 2019-04-25 LAB — LIPASE, BLOOD: Lipase: 25 U/L (ref 11–51)

## 2019-04-25 MED ORDER — METOCLOPRAMIDE HCL 5 MG/ML IJ SOLN
10.0000 mg | Freq: Once | INTRAMUSCULAR | Status: AC
  Administered 2019-04-25: 03:00:00 10 mg via INTRAVENOUS
  Filled 2019-04-25: qty 2

## 2019-04-25 MED ORDER — INSULIN GLARGINE 100 UNIT/ML ~~LOC~~ SOLN
20.0000 [IU] | Freq: Once | SUBCUTANEOUS | Status: AC
  Administered 2019-04-25: 06:00:00 20 [IU] via SUBCUTANEOUS
  Filled 2019-04-25: qty 0.2

## 2019-04-25 MED ORDER — SODIUM CHLORIDE 0.9% FLUSH
3.0000 mL | Freq: Once | INTRAVENOUS | Status: AC
  Administered 2019-04-25: 03:00:00 3 mL via INTRAVENOUS

## 2019-04-25 MED ORDER — METOCLOPRAMIDE HCL 10 MG PO TABS: 10.0000 mg | ORAL_TABLET | Freq: Four times a day (QID) | ORAL | 0 refills | Status: DC | PRN

## 2019-04-25 MED ORDER — INSULIN ASPART 100 UNIT/ML ~~LOC~~ SOLN
10.0000 [IU] | Freq: Once | SUBCUTANEOUS | Status: AC
  Administered 2019-04-25: 04:00:00 10 [IU] via INTRAVENOUS

## 2019-04-25 NOTE — ED Notes (Signed)
Patient verbalizes understanding of discharge instructions. Opportunity for questioning and answers were provided. Armband removed by staff, pt discharged from ED home via POV.  

## 2019-04-25 NOTE — ED Triage Notes (Signed)
Pt from home with vomiting for the last 14hrs pt was seen in the ED yesterday and treated for DKA. Pt has had increased thirst, lethargy, nausea and vomiting

## 2019-04-25 NOTE — ED Notes (Signed)
Unsuccessful attempt of blood draw

## 2019-04-26 NOTE — ED Provider Notes (Signed)
[facility] EMERGENCY DEPARTMENT Provider Note   CSN: [id number] Arrival date & time: 04/25/19  0214    History   Chief Complaint Chief Complaint  Patient presents with  . Emesis    HPI FARIN BUHMAN is a 58 y.o. female.      Emesis  Severity:  Mild Timing:  Constant Quality:  Stomach contents Progression:  Unable to specify Recent urination:  Normal Relieved by:  Nothing Worsened by:  Nothing Ineffective treatments:  Antiemetics Associated symptoms: no abdominal pain and no arthralgias   Risk factors: diabetes   Risk factors: no alcohol use     Past Medical History:  Diagnosis Date  . Anxiety   . B12 deficiency   . Chest pain 04/05/2017  . CHF (congestive heart failure) ([place])   . Diabetes mellitus type 1, uncontrolled ([place])    "dr. just changed me to type 1, uncontrolled" (11/26/2018)  . DKA (diabetic ketoacidoses) ([place]) 05/25/2018  . GERD (gastroesophageal reflux disease)   . Hyperlipidemia   . Hypertension    [name]  . Hypothyroidism   . IBS (irritable bowel syndrome)   . Intermittent vertigo   . 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 Leg cramping    "@ night" (09/18/2013)  . Migraine    "once q couple months" (11/26/2018)  . Osteoporosis   . Peripheral neuropathy   . Umbilical hernia    unrepaired (09/18/2013)    Patient Active Problem List   Diagnosis Date Noted  . Snoring 04/10/2019  . Medication management 04/10/2019  . Constipation   . Nausea and vomiting 03/25/2019  . DKA (diabetic ketoacidosis) ([place]) 11/26/2018  . SBO (small bowel obstruction) ([place]) 11/26/2018  . DKA (diabetic ketoacidoses) ([place]) 09/23/2018  . Tachycardia 08/04/2018  . MDD (major depressive disorder), single episode, mild ([place]) 07/01/2018  . Colitis 03/07/2018  . Chronic chest pain   . Chronic systolic CHF (congestive heart failure) ([place]) 01/14/2018  . Noncompliance 01/14/2018  . Migraines 12/11/2017  . Chest  discomfort 09/18/2017  . DKA, type 2 ([place]) 09/17/2017  . Phrygian cap 06/20/2017  . Leukocytosis   . Lower abdominal pain   . Colitis presumed infectious   . Nonischemic cardiomyopathy ([place]) 03/22/2017  . Hypokalemia 02/15/2017  . NSTEMI (non-ST elevated myocardial infarction) ([place]) 02/11/2017  . H. pylori infection 02/09/2017  . Hypomagnesemia 02/09/2017  . Gastroparesis due to DM ([place]) 01/22/2017  . Poorly controlled type 2 diabetes mellitus with peripheral neuropathy ([place]) 07/17/2016  . Atrial fibrillation ([place]) 02/01/2015  . 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 Hepatic lesion 09/18/2013  . Vitamin D deficiency 05/20/2012  . Essential hypertension 02/01/2011  . Osteoporosis 02/01/2011  . B12 deficiency 04/04/2010  . Peripheral neuropathy ([place]) 05/10/2009  . GERD 04/08/2009  . Generalized anxiety disorder 06/22/2008  . Hypothyroidism 04/29/2008  . Hyperlipidemia 04/29/2008  . Irritable bowel syndrome 04/29/2008    Past Surgical History:  Procedure Laterality Date  . CESAREAN SECTION  1989  . LAPAROSCOPIC ASSISTED VAGINAL HYSTERECTOMY  09/23/2012   Procedure: LAPAROSCOPIC ASSISTED VAGINAL HYSTERECTOMY;  Surgeon: Linda Hedges, DO;  Location: [facility];  Service: Gynecology;  Laterality: N/A;  pull Dr Gregor Hams instrument  . LEFT HEART CATH AND CORONARY ANGIOGRAPHY N/A 02/11/2017   Procedure: Left Heart Cath and Coronary Angiography;  Surgeon: Lorretta Harp, MD;  Location: [facility];  Service: Cardiovascular;  Laterality: N/A;  . TUBAL LIGATION Bilateral 1992     OB  History    Gravida  2   Para  2   Term      Preterm      AB      Living        SAB      TAB      Ectopic      Multiple      Live Births               Home Medications    Prior to Admission medications   Medication Sig Start Date End Date Taking? Authorizing Provider  acetaminophen (TYLENOL) 325 MG tablet Take 650 mg by mouth every 6 (six) hours as  needed for mild pain, fever or headache.   Yes [provider]  aspirin 81 MG chewable tablet Chew 1 tablet (81 mg total) by mouth daily. 02/12/17  Yes Jani Gravel, MD  ezetimibe (ZETIA) 10 MG tablet Take 1 tablet (10 mg total) by mouth daily. 04/02/19  Yes Bedsole, Amy E, MD  fexofenadine (ALLEGRA) 180 MG tablet Take 1 tablet (180 mg total) by mouth daily. 04/02/19  Yes Bedsole, Amy E, MD  furosemide (LASIX) 20 MG tablet Take 20 mg by mouth as needed for fluid. 01/03/19  Yes [provider]  insulin lispro (HUMALOG KWIKPEN) 100 UNIT/ML KiwkPen Inject into skin 3 times a day at meal time according to the following sliding scale: CBG 70-120 - 0 units / CBG 121-150 - 1 unit / CBG 151-200 - 2 units / CBG 201-250 - 3 units / CBG 251-300 - 5 units / CBG 301-350 - 7 units / CBG 351-400+ - 9 units  YOU MUST FOLLOW A STRICT DIABETIC DIET Patient taking differently: Inject 0-9 Units into the skin 3 (three) times daily. Inject into skin 3 times a day at meal time according to the following sliding scale: CBG 70-120 - 0 units / CBG 121-150 - 1 unit / CBG 151-200 - 2 units / CBG 201-250 - 3 units / CBG 251-300 - 5 units / CBG 301-350 - 7 units / CBG 351-400+ - 9 units  YOU MUST FOLLOW A STRICT DIABETIC DIET 07/01/18  Yes Bedsole, Amy E, MD  levothyroxine (SYNTHROID, LEVOTHROID) 150 MCG tablet TAKE 1 TABLET BY MOUTH EVERY DAY Patient taking differently: Take 150 mcg by mouth daily before breakfast.  11/10/18  Yes Bedsole, Amy E, MD  losartan (COZAAR) 25 MG tablet TAKE 1 TABLET BY MOUTH EVERY DAY Patient taking differently: Take 25 mg by mouth daily.  03/17/19  Yes Barrett, Evelene Croon, PA-C  metoprolol succinate (TOPROL-XL) 25 MG 24 hr tablet TAKE 1 TABLET BY MOUTH EVERY DAY Patient taking differently: Take 25 mg by mouth daily.  10/06/18  Yes Minus Breeding, MD  nitroGLYCERIN (NITRODUR - DOSED IN MG/24 HR) 0.2 mg/hr patch APPLY 1 PATCH TO SKIN EVERY DAY AS NEEDED Patient taking differently:  Place 0.2 mg onto the skin daily.  08/27/18  Yes Barrett, Evelene Croon, PA-C  pantoprazole (PROTONIX) 40 MG tablet Take 1 tablet (40 mg total) by mouth daily at 12 noon. Patient taking differently: Take 40 mg by mouth daily.  01/28/18  Yes Bedsole, Amy E, MD  promethazine (PHENERGAN) 25 MG tablet TAKE 1 TABLET BY MOUTH EVERY 8 HOURS AS NEEDED FOR NAUSEA OR VOMITING. Patient taking differently: Take 25 mg by mouth every 8 (eight) hours as needed for nausea or vomiting.  04/01/19  Yes Bedsole, Amy E, MD  rosuvastatin (CRESTOR) 40 MG tablet Take 1 tablet (40 mg  total) by mouth daily. 04/10/19 07/09/19 Yes Hochrein, Jeneen Rinks, MD  sertraline (ZOLOFT) 50 MG tablet TAKE 1 TABLET BY MOUTH EVERY DAY Patient taking differently: Take 50 mg by mouth daily.  12/19/18  Yes Bedsole, Amy E, MD  TRESIBA FLEXTOUCH 100 UNIT/ML SOPN FlexTouch Pen Inject 0.24 mLs (24 Units total) into the skin at bedtime. On 10/20/18 take at lunch time, on 10/21/18 take at dinner time. On 10/22/18 resume at bedtime regimen. Patient taking differently: Inject 24 Units into the skin at bedtime.  10/20/18  Yes Lavina Hamman, MD  Continuous Blood Gluc Sensor (FREESTYLE LIBRE 14 DAY SENSOR) MISC by Does not apply route. 04/01/19   [provider]  Insulin Pen Needle (PEN NEEDLES) 31G X 8 MM MISC Use to inject insulin 4 times a day.  Dx: E11.10 12/23/17   Jinny Sanders, MD  Lancets (ONETOUCH ULTRASOFT) lancets Use to check blood sugar daily as needed.  Dx: E11.10 12/23/17   Jinny Sanders, MD  metoCLOPramide (REGLAN) 10 MG tablet Take 1 tablet (10 mg total) by mouth every 6 (six) hours as needed for nausea (nausea/headache). 04/25/19   Jaxin Fulfer, Corene Cornea, MD  [facility] VERIO test strip USE TO CHECK BLOOD SUGAR DAILY AS NEEDED. DX: E11.10 09/21/18   Jinny Sanders, MD    Family History Family History  Problem Relation Age of Onset  . Diabetes Other   . Heart disease Other   . Heart failure Mother 8       Her heart skipped a beat and stopped - per  pt  . Diabetes Maternal Grandmother   . Alzheimer's disease Maternal Grandmother   . Coronary artery disease Maternal Grandmother   . Heart attack Maternal Grandfather   . Heart failure Brother     Social History Social History   Tobacco Use  . Smoking status: Former Smoker    Packs/day: 0.12    Years: 10.00    Pack years: 1.20    Types: Cigarettes    Last attempt to quit: 12/10/1990    Years since quitting: 28.3  . Smokeless tobacco: Never Used  Substance Use Topics  . Alcohol use: Never    Alcohol/week: 0.0 standard drinks    Frequency: Never  . Drug use: Yes    Types: Marijuana    Comment: last use 2 days ago     Allergies   Codeine   Review of Systems Review of Systems  Gastrointestinal: Positive for vomiting. Negative for abdominal pain.  Musculoskeletal: Negative for arthralgias.  All other systems reviewed and are negative.    Physical Exam Updated Vital Signs BP 114/79   Pulse (!) 104   Temp 99.2 F (37.3 C) (Oral)   Resp 13   Ht 4' 11.5" (1.511 m)   Wt 65.3 kg   LMP 08/11/2012   SpO2 97%   BMI 28.59 kg/m   Physical Exam Vitals signs and nursing note reviewed.  Constitutional:      Appearance: She is well-developed.  HENT:     Head: Normocephalic and atraumatic.     Mouth/Throat:     Mouth: Mucous membranes are dry.     Pharynx: Oropharynx is clear.  Eyes:     Conjunctiva/sclera: Conjunctivae normal.  Neck:     Musculoskeletal: Normal range of motion.  Cardiovascular:     Rate and Rhythm: Regular rhythm. Tachycardia present.  Pulmonary:     Effort: No respiratory distress.     Breath sounds: No stridor.  Abdominal:  General: Abdomen is flat. There is no distension.  Neurological:     General: No focal deficit present.     Mental Status: She is alert.      ED Treatments / Results  Labs (all labs ordered are listed, but only abnormal results are displayed) Labs Reviewed  COMPREHENSIVE METABOLIC PANEL - Abnormal; Notable for  the following components:      Result Value   Chloride 96 (*)    CO2 20 (*)    Glucose, Bld 354 (*)    Total Bilirubin 1.6 (*)    Anion gap 20 (*)    All other components within normal limits  CBC - Abnormal; Notable for the following components:   WBC 19.1 (*)    RBC 5.71 (*)    Hemoglobin 16.0 (*)    HCT 47.1 (*)    Platelets 415 (*)    All other components within normal limits  URINALYSIS, ROUTINE W REFLEX MICROSCOPIC - Abnormal; Notable for the following components:   Glucose, UA >=500 (*)    Ketones, ur 80 (*)    Protein, ur 100 (*)    Bacteria, UA RARE (*)    All other components within normal limits  BASIC METABOLIC PANEL - Abnormal; Notable for the following components:   CO2 21 (*)    Glucose, Bld 221 (*)    Calcium 8.6 (*)    All other components within normal limits  CBG MONITORING, ED - Abnormal; Notable for the following components:   Glucose-Capillary 333 (*)    All other components within normal limits  POCT I-STAT EG7 - Abnormal; Notable for the following components:   pH, Ven 7.476 (*)    pCO2, Ven 34.0 (*)    pO2, Ven 181.0 (*)    Calcium, Ion 1.05 (*)    HCT 48.0 (*)    Hemoglobin 16.3 (*)    All other components within normal limits  CBG MONITORING, ED - Abnormal; Notable for the following components:   Glucose-Capillary 224 (*)    All other components within normal limits  LIPASE, BLOOD    EKG EKG Interpretation  Date/Time:  Saturday Apr 25 2019 02:19:11 EDT Ventricular Rate:  98 PR Interval:    QRS Duration: 87 QT Interval:  380 QTC Calculation: 486 R Axis:   96 Text Interpretation:  Sinus rhythm Borderline right axis deviation Anteroseptal infarct, old Baseline wander in lead(s) V3 V4 No significant change since last tracing in 4/15 Confirmed by Veda Arrellano, Corene Cornea [phone]) on 04/25/2019 3:03:07 AM   Radiology No results found.  Procedures Procedures (including critical care time)  Medications Ordered in ED Medications  sodium chloride  flush (NS) 0.9 % injection 3 mL (3 mLs Intravenous Given 04/25/19 0320)  metoCLOPramide (REGLAN) injection 10 mg (10 mg Intravenous Given 04/25/19 0320)  insulin aspart (novoLOG) injection 10 Units (10 Units Intravenous Given 04/25/19 0356)  insulin glargine (LANTUS) injection 20 Units (20 Units Subcutaneous Given 04/25/19 0537)     Initial Impression / Assessment and Plan / ED Course  I have reviewed the triage vital signs and the nursing notes.  Pertinent labs & imaging results that were available during my care of the patient were reviewed by me and considered in my medical decision making (see chart for details).        Patient initially found to be technically in DKA but minimal gap minimal bicarb deviation so given some insulin some fluids were checked and had improved significantly.  Her vomiting had improved.  Patient always has some level of hyperglycemia and her gap is closed so already has subcutaneous insulin, stable for discharge at this time.  Final Clinical Impressions(s) / ED Diagnoses   Final diagnoses:  Hyperglycemia    ED Discharge Orders         Ordered    metoCLOPramide (REGLAN) 10 MG tablet  Every 6 hours PRN     04/25/19 0753           Jakeem Grape, Corene Cornea, MD 04/26/19 0025

## 2019-04-27 ENCOUNTER — Telehealth: Payer: Self-pay

## 2019-04-27 NOTE — Telephone Encounter (Signed)
Attempted to reach patient to complete TCM and schedule hospital f/u appt. Attempt unsuccessful. Left message on primary phone with contact info.

## 2019-04-28 ENCOUNTER — Encounter: Payer: Self-pay | Admitting: General Surgery

## 2019-04-28 ENCOUNTER — Encounter: Payer: Self-pay | Admitting: Family Medicine

## 2019-04-28 ENCOUNTER — Ambulatory Visit (INDEPENDENT_AMBULATORY_CARE_PROVIDER_SITE_OTHER): Payer: 59 | Admitting: Family Medicine

## 2019-04-28 VITALS — Ht 59.0 in | Wt 142.0 lb

## 2019-04-28 DIAGNOSIS — F320 Major depressive disorder, single episode, mild: Secondary | ICD-10-CM

## 2019-04-28 DIAGNOSIS — E1142 Type 2 diabetes mellitus with diabetic polyneuropathy: Secondary | ICD-10-CM | POA: Diagnosis not present

## 2019-04-28 DIAGNOSIS — Z91199 Patient's noncompliance with other medical treatment and regimen due to unspecified reason: Secondary | ICD-10-CM

## 2019-04-28 DIAGNOSIS — E1165 Type 2 diabetes mellitus with hyperglycemia: Secondary | ICD-10-CM

## 2019-04-28 DIAGNOSIS — K3184 Gastroparesis: Secondary | ICD-10-CM

## 2019-04-28 DIAGNOSIS — Z9119 Patient's noncompliance with other medical treatment and regimen: Secondary | ICD-10-CM

## 2019-04-28 DIAGNOSIS — E1143 Type 2 diabetes mellitus with diabetic autonomic (poly)neuropathy: Secondary | ICD-10-CM | POA: Diagnosis not present

## 2019-04-28 NOTE — Assessment & Plan Note (Signed)
This is her biggest issue. We discussed in deatil. Will try to treat mood disorder better to see if it improves her motivation and overall outlook and complicae with medication. She refuses counseling.

## 2019-04-28 NOTE — Telephone Encounter (Signed)
TCM/hospital and ED f/u appt scheduled 04/28/19 @ 12PM

## 2019-04-28 NOTE — Assessment & Plan Note (Signed)
Now back on medication and CBGs are improving. Continue current regimen.

## 2019-04-28 NOTE — Assessment & Plan Note (Signed)
ON reglan.  HAs follow up with Dr. Eugenia Pancoast tommorow. Pt feels GI issues are cause of hospitalizations. Tried to get pt to understand that better DM control should help gastroparesis some and help keep her out of hospital.

## 2019-04-28 NOTE — Progress Notes (Signed)
VIRTUAL VISIT Due to national recommendations of social distancing due to [place] 19, a virtual visit is felt to be most appropriate for this patient at this time.   I connected with the patient on 04/28/19 at 12:00 PM EDT by virtual telehealth platform and verified that I am speaking with the correct person using two identifiers.   I discussed the limitations, risks, security and privacy concerns of performing an evaluation and management service by  virtual telehealth platform and the availability of in person appointments. I also discussed with the patient that there may be a patient responsible charge related to this service. The patient expressed understanding and agreed to proceed.  Patient location: Home Provider Location: [facility] [place] Participants: Eliezer Lofts and Cherlyn Cushing   Chief Complaint  Patient presents with  . Hospitalization Follow-up    Hyperglycemia    History of Present Illness:  58 year old female diabetic patient presents for ED follow up . Seen on 5/16 for emesis.  This was 12 hours after discharge from hospital ( 5/14 admission for DKA)... pt feels she was discharged to soon.   She has multiple hospitalizations in the last year for similar issues. Patient initially found to be technically in DKA but minimal gap minimal bicarb deviation so given some insulin some fluids were checked and had improved significantly.  Her vomiting had improved.  Patient always has some level of hyperglycemia and her gap is closed .  Today she reports that again prior to this admission she had stopped her insulin.. she cannot pinpoint why... " you know how I am"  Her blood sugars were in 400s, but she was " getting back on track" but then have repeated emesis. Now she is feeling better post hospitalization... sleeping well, goof liquid intake and gradually increasing po intake. Since D/C her CBGs have been in 100s per pt.  HAs ENDO follow up 6/22. HAs GI follow up tomorrow..  she feels her gastroparesis is what leads to ehr hopsitalizations. She has reglan QID and phenergan to use. She reports compliance with reglan.  She did report increase irritability  And increased stress in last few months with covid 88, daughter living with her. She is currently taking sertraline 50 mg daily.   COVID 19 screen No recent travel or known exposure to COVID19 The patient denies respiratory symptoms of COVID 19 at this time.  The importance of social distancing was discussed today.   Review of Systems  Constitutional: Negative for chills and fever.  HENT: Negative for congestion and ear pain.   Eyes: Negative for pain and redness.  Respiratory: Negative for cough and shortness of breath.   Cardiovascular: Negative for chest pain, palpitations and leg swelling.  Gastrointestinal: Negative for abdominal pain, blood in stool, constipation, diarrhea, nausea and vomiting.  Genitourinary: Negative for dysuria.  Musculoskeletal: Negative for falls and myalgias.  Skin: Negative for rash.  Neurological: Negative for dizziness.  Psychiatric/Behavioral: Positive for depression. Negative for hallucinations, substance abuse and suicidal ideas. The patient is not nervous/anxious and does not have insomnia.       Past Medical History:  Diagnosis Date  . Anxiety   . B12 deficiency   . Chest pain 04/05/2017  . CHF (congestive heart failure) ([place])   . Diabetes mellitus type 1, uncontrolled ([place])    "dr. just changed me to type 1, uncontrolled" (11/26/2018)  . DKA (diabetic ketoacidoses) ([place]) 05/25/2018  . GERD (gastroesophageal reflux disease)   . Hyperlipidemia   .  Hypertension    [name]  . Hypothyroidism   . IBS (irritable bowel syndrome)   . Intermittent vertigo   . 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 Leg cramping    "@ night" (09/18/2013)  . Migraine    "once q couple months" (11/26/2018)  . Osteoporosis   . Peripheral neuropathy   .  Umbilical hernia    unrepaired (09/18/2013)    reports that she quit smoking about 28 years ago. Her smoking use included cigarettes. She has a 1.20 pack-year smoking history. She has never used smokeless tobacco. She reports current drug use. Drug: Marijuana. She reports that she does not drink alcohol.   Current Outpatient Medications:  .  acetaminophen (TYLENOL) 325 MG tablet, Take 650 mg by mouth every 6 (six) hours as needed for mild pain, fever or headache., Disp: , Rfl:  .  aspirin 81 MG chewable tablet, Chew 1 tablet (81 mg total) by mouth daily., Disp: 30 tablet, Rfl: 0 .  Continuous Blood Gluc Sensor (FREESTYLE LIBRE 14 DAY SENSOR) MISC, by Does not apply route., Disp: , Rfl:  .  fexofenadine (ALLEGRA) 180 MG tablet, Take 1 tablet (180 mg total) by mouth daily., Disp: 90 tablet, Rfl: 3 .  furosemide (LASIX) 20 MG tablet, Take 20 mg by mouth as needed for fluid., Disp: , Rfl:  .  insulin lispro (HUMALOG KWIKPEN) 100 UNIT/ML KiwkPen, Inject into skin 3 times a day at meal time according to the following sliding scale: CBG 70-120 - 0 units / CBG 121-150 - 1 unit / CBG 151-200 - 2 units / CBG 201-250 - 3 units / CBG 251-300 - 5 units / CBG 301-350 - 7 units / CBG 351-400+ - 9 units  YOU MUST FOLLOW A STRICT DIABETIC DIET, Disp: 4 pen, Rfl: 11 .  Insulin Pen Needle (PEN NEEDLES) 31G X 8 MM MISC, Use to inject insulin 4 times a day.  Dx: E11.10, Disp: 130 each, Rfl: 11 .  Lancets (ONETOUCH ULTRASOFT) lancets, Use to check blood sugar daily as needed.  Dx: E11.10, Disp: 100 each, Rfl: 3 .  levothyroxine (SYNTHROID, LEVOTHROID) 150 MCG tablet, TAKE 1 TABLET BY MOUTH EVERY DAY, Disp: 90 tablet, Rfl: 3 .  losartan (COZAAR) 25 MG tablet, TAKE 1 TABLET BY MOUTH EVERY DAY, Disp: 90 tablet, Rfl: 1 .  metoCLOPramide (REGLAN) 10 MG tablet, Take 1 tablet (10 mg total) by mouth every 6 (six) hours as needed for nausea (nausea/headache)., Disp: 30 tablet, Rfl: 0 .  metoprolol succinate (TOPROL-XL) 25 MG 24  hr tablet, TAKE 1 TABLET BY MOUTH EVERY DAY, Disp: 90 tablet, Rfl: 2 .  nitroGLYCERIN (NITRODUR - DOSED IN MG/24 HR) 0.2 mg/hr patch, APPLY 1 PATCH TO SKIN EVERY DAY AS NEEDED, Disp: 90 patch, Rfl: 1 .  ONETOUCH VERIO test strip, USE TO CHECK BLOOD SUGAR DAILY AS NEEDED. DX: E11.10, Disp: 100 each, Rfl: 3 .  pantoprazole (PROTONIX) 40 MG tablet, Take 1 tablet (40 mg total) by mouth daily at 12 noon., Disp: 30 tablet, Rfl: 11 .  promethazine (PHENERGAN) 25 MG tablet, TAKE 1 TABLET BY MOUTH EVERY 8 HOURS AS NEEDED FOR NAUSEA OR VOMITING., Disp: 30 tablet, Rfl: 0 .  rosuvastatin (CRESTOR) 40 MG tablet, Take 1 tablet (40 mg total) by mouth daily., Disp: 90 tablet, Rfl: 3 .  sertraline (ZOLOFT) 50 MG tablet, TAKE 1 TABLET BY MOUTH EVERY DAY, Disp: 90 tablet, Rfl: 1 .  TRESIBA FLEXTOUCH 100 UNIT/ML SOPN FlexTouch Pen,  Inject 0.24 mLs (24 Units total) into the skin at bedtime. On 10/20/18 take at lunch time, on 10/21/18 take at dinner time. On 10/22/18 resume at bedtime regimen., Disp: , Rfl: 5   Observations/Objective: Height 4\' 11"  (1.499 m), weight 142 lb (64.4 kg), last menstrual period 08/11/2012.  Physical Exam  Physical Exam Constitutional:      General: The patient is not in acute distress. Pulmonary:     Effort: Pulmonary effort is normal. No respiratory distress.  Neurological:     Mental Status: The patient is alert and oriented to person, place, and time.  Psychiatric:        Mood and Affect: Mood normal.        Behavior: Behavior normal.   Assessment and Plan MDD (major depressive disorder), single episode, mild ([name]) Increase sertraline to 100 mg daily. Close follow up in 1 month.  Noncompliance This is her biggest issue. We discussed in deatil. Will try to treat mood disorder better to see if it improves her motivation and overall outlook and complicae with medication. She refuses counseling.  Poorly controlled type 2 diabetes mellitus with peripheral neuropathy ([name]) Now back  on medication and CBGs are improving. Continue current regimen.  Gastroparesis due to DM ([place]) ON reglan.  HAs follow up with Dr. Eugenia Pancoast tommorow. Pt feels GI issues are cause of hospitalizations. Tried to get pt to understand that better DM control should help gastroparesis some and help keep her out of hospital.    I discussed the assessment and treatment plan with the patient. The patient was provided an opportunity to ask questions and all were answered. The patient agreed with the plan and demonstrated an understanding of the instructions.   The patient was advised to call back or seek an in-person evaluation if the symptoms worsen or if the condition fails to improve as anticipated.     Eliezer Lofts, MD

## 2019-04-28 NOTE — Assessment & Plan Note (Signed)
Increase sertraline to 100 mg daily. Close follow up in 1 month.

## 2019-04-29 ENCOUNTER — Encounter: Payer: Self-pay | Admitting: Gastroenterology

## 2019-04-29 ENCOUNTER — Other Ambulatory Visit: Payer: Self-pay

## 2019-04-29 ENCOUNTER — Ambulatory Visit (INDEPENDENT_AMBULATORY_CARE_PROVIDER_SITE_OTHER): Payer: 59 | Admitting: Gastroenterology

## 2019-04-29 VITALS — Ht 59.0 in | Wt 142.0 lb

## 2019-04-29 DIAGNOSIS — R112 Nausea with vomiting, unspecified: Secondary | ICD-10-CM | POA: Diagnosis not present

## 2019-04-29 DIAGNOSIS — Z1211 Encounter for screening for malignant neoplasm of colon: Secondary | ICD-10-CM | POA: Diagnosis not present

## 2019-04-29 MED ORDER — FUROSEMIDE 20 MG PO TABS: 20.0000 mg | ORAL_TABLET | ORAL | 1 refills | Status: DC | PRN

## 2019-04-29 MED ORDER — PEG 3350-KCL-NA BICARB-NACL 420 G PO SOLR: 4000.0000 mL | ORAL | 0 refills | Status: DC

## 2019-04-29 MED ORDER — METOCLOPRAMIDE HCL 5 MG PO TABS: ORAL_TABLET | ORAL | 11 refills | Status: DC

## 2019-04-29 NOTE — Patient Instructions (Addendum)
We will arrange a colonoscopy at her soonest convenience for routine risk screening, her last screening test was in 2009.  We will arrange a gastric emptying study to be performed to see if we can prove that she has gastroparesis  We will call in a new prescription for her for Reglan.  She already takes it but I want her to change the way she is taking it.  She needs to start taking 5 mg before breakfast, lunch and dinner.  She needs to take 10 mg at bedtime every night.  We will call in a 1 month prescription with 11 refills.  She will consider cutting back on her marijuana use as I explained to her that it might also contribute to some of her nausea.  You have been scheduled for a gastric emptying scan at [facility] on 05/22/19 at 7:15. Please arrive at least 15 minutes prior to your appointment for registration. Please make certain not to have anything to eat or drink after midnight the night before your test. Hold all stomach medications (ex: Zofran, phenergan, Reglan) 48 hours prior to your test. If you need to reschedule your appointment, please contact radiology scheduling at [phone]. _____________________________________________________________________ A gastric-emptying study measures how long it takes for food to move through your stomach. There are several ways to measure stomach emptying. In the most common test, you eat food that contains a small amount of radioactive material. A scanner that detects the movement of the radioactive material is placed over your abdomen to monitor the rate at which food leaves your stomach. This test normally takes about 4 hours to complete.  Thank you for entrusting me with your care and choosing [facility].  Dr Ardis Hughs  _____________________________________________________________________

## 2019-04-29 NOTE — Progress Notes (Signed)
Review of pertinent gastrointestinal problems: 1.  Dysphasia.  Led to EGD Dr. Ardis Hughs February 2018 Schatzki's ring was noted and dilated up to 20 mm. 2.  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  Gastric emptying scan April 2018 was normal however.  Has frequent episodes of DKA.  During 1 episode of DKA while hospitalized December 2019 she underwent a CT scan of her abdomen pelvis with IV and oral contrast for "abdominal pain with nausea and vomiting" it was normal. 3. Marijuana smoker (may contribute to #2)`    This service was provided via virtual visit.  Only audio was used because my AV app was not working.  The patient was located at home.  I was located in my office.  The patient did consent to this virtual visit and is aware of possible charges through their insurance for this visit.  The patient is an established patient.  My certified medical assistant, Grace Bushy, contributed to this visit by contacting the patient by phone 1 or 2 business days prior to the appointment and also followed up on the recommendations I made after the visit.  Time spent on virtual visit: 23 ninutes   HPI: This is a very pleasant 58 year old woman whom I last saw a little over 2 years ago.  That was for an upper endoscopy during which I performed a dilation of Schatzki's ring.  Since then she has been admitted to the hospital multiple times for DKA.  She has nausea and vomiting chronically.  This has been attributed to diabetic related gastroparesis and that may indeed be correct however gastric emptying scan in 2018 was normal.  She takes reglan 5mg  QID (for at least a year).  She has nausea chronically and it is usually worse in the mornings.  She has no significant abdominal pains.  Her bowels have been normal without overt bleeding constipation or serious diarrhea.  Her weight has been overall stable.  Her dysphasia is gone.  Hemoglobin A1c 7 months ago was 15.   04/2019 it was 12  Drug screen 5/20 ++ for [facility]   Chief complaint is chronic nausea and vomiting, routine risk for colon cancer ROS: complete GI ROS as described in HPI, all other review negative.  Constitutional:  No unintentional weight loss   Past Medical History:  Diagnosis Date  . Anxiety   . B12 deficiency   . Chest pain 04/05/2017  . CHF (congestive heart failure) ([place])   . Diabetes mellitus type 1, uncontrolled ([place])    "dr. just changed me to type 1, uncontrolled" (11/26/2018)  . DKA (diabetic ketoacidoses) ([place]) 05/25/2018  . GERD (gastroesophageal reflux disease)   . Hyperlipidemia   . Hypertension    [name]  . Hypothyroidism   . IBS (irritable bowel syndrome)   . Intermittent vertigo   . 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 Leg cramping    "@ night" (09/18/2013)  . Migraine    "once q couple months" (11/26/2018)  . Osteoporosis   . Peripheral neuropathy   . Umbilical hernia    unrepaired (09/18/2013)    Past Surgical History:  Procedure Laterality Date  . CESAREAN SECTION  1989  . LAPAROSCOPIC ASSISTED VAGINAL HYSTERECTOMY  09/23/2012   Procedure: LAPAROSCOPIC ASSISTED VAGINAL HYSTERECTOMY;  Surgeon: Linda Hedges, DO;  Location: [facility];  Service: Gynecology;  Laterality: N/A;  pull Dr Gregor Hams instrument  . LEFT HEART CATH AND CORONARY ANGIOGRAPHY N/A 02/11/2017  Procedure: Left Heart Cath and Coronary Angiography;  Surgeon: Lorretta Harp, MD;  Location: [facility];  Service: Cardiovascular;  Laterality: N/A;  . TUBAL LIGATION Bilateral 1992    Current Outpatient Medications  Medication Sig Dispense Refill  . acetaminophen (TYLENOL) 325 MG tablet Take 650 mg by mouth every 6 (six) hours as needed for mild pain, fever or headache.    Marland Kitchen aspirin 81 MG chewable tablet Chew 1 tablet (81 mg total) by mouth daily. 30 tablet 0  . Continuous Blood Gluc Sensor (FREESTYLE LIBRE 14 DAY SENSOR) MISC by Does not apply route.    .  fexofenadine (ALLEGRA) 180 MG tablet Take 1 tablet (180 mg total) by mouth daily. 90 tablet 3  . furosemide (LASIX) 20 MG tablet Take 20 mg by mouth as needed for fluid.    Marland Kitchen insulin lispro (HUMALOG KWIKPEN) 100 UNIT/ML KiwkPen Inject into skin 3 times a day at meal time according to the following sliding scale: CBG 70-120 - 0 units / CBG 121-150 - 1 unit / CBG 151-200 - 2 units / CBG 201-250 - 3 units / CBG 251-300 - 5 units / CBG 301-350 - 7 units / CBG 351-400+ - 9 units  YOU MUST FOLLOW A STRICT DIABETIC DIET 4 pen 11  . Insulin Pen Needle (PEN NEEDLES) 31G X 8 MM MISC Use to inject insulin 4 times a day.  Dx: E11.10 130 each 11  . Lancets (ONETOUCH ULTRASOFT) lancets Use to check blood sugar daily as needed.  Dx: E11.10 100 each 3  . levothyroxine (SYNTHROID, LEVOTHROID) 150 MCG tablet TAKE 1 TABLET BY MOUTH EVERY DAY 90 tablet 3  . losartan (COZAAR) 25 MG tablet TAKE 1 TABLET BY MOUTH EVERY DAY 90 tablet 1  . metoCLOPramide (REGLAN) 10 MG tablet Take 1 tablet (10 mg total) by mouth every 6 (six) hours as needed for nausea (nausea/headache). 30 tablet 0  . metoprolol succinate (TOPROL-XL) 25 MG 24 hr tablet TAKE 1 TABLET BY MOUTH EVERY DAY 90 tablet 2  . nitroGLYCERIN (NITRODUR - DOSED IN MG/24 HR) 0.2 mg/hr patch APPLY 1 PATCH TO SKIN EVERY DAY AS NEEDED 90 patch 1  . ONETOUCH VERIO test strip USE TO CHECK BLOOD SUGAR DAILY AS NEEDED. DX: E11.10 100 each 3  . pantoprazole (PROTONIX) 40 MG tablet Take 1 tablet (40 mg total) by mouth daily at 12 noon. 30 tablet 11  . promethazine (PHENERGAN) 25 MG tablet TAKE 1 TABLET BY MOUTH EVERY 8 HOURS AS NEEDED FOR NAUSEA OR VOMITING. 30 tablet 0  . rosuvastatin (CRESTOR) 40 MG tablet Take 1 tablet (40 mg total) by mouth daily. 90 tablet 3  . sertraline (ZOLOFT) 50 MG tablet TAKE 1 TABLET BY MOUTH EVERY DAY (Patient taking differently: Take 100 mg by mouth. ) 90 tablet 1  . TRESIBA FLEXTOUCH 100 UNIT/ML SOPN FlexTouch Pen Inject 0.24 mLs (24 Units  total) into the skin at bedtime. On 10/20/18 take at lunch time, on 10/21/18 take at dinner time. On 10/22/18 resume at bedtime regimen.  5   No current facility-administered medications for this visit.     Allergies as of 04/29/2019 - Review Complete 04/29/2019  Allergen Reaction Noted  . Codeine Hives, Anxiety, and Other (See Comments)     Family History  Problem Relation Age of Onset  . Diabetes Other   . Heart disease Other   . Heart failure Mother 46       Her heart skipped a beat and stopped -  per pt  . Diabetes Maternal Grandmother   . Alzheimer's disease Maternal Grandmother   . Coronary artery disease Maternal Grandmother   . Heart attack Maternal Grandfather   . Heart failure Brother   . Diabetes Brother   . Colon polyps Neg Hx   . Esophageal cancer Neg Hx   . Liver cancer Neg Hx   . Pancreatic cancer Neg Hx   . Stomach cancer Neg Hx     Social History   Socioeconomic History  . Marital status: Married    Spouse name: Not on file  . Number of children: 2  . Years of education: Not on file  . Highest education level: Not on file  Occupational History  . Occupation: UNEMPLOYED    Fish farm manager: UNEMPLOYED    Comment: homemaker  Social Needs  . Financial resource strain: Not on file  . Food insecurity:    Worry: Not on file    Inability: Not on file  . Transportation needs:    Medical: Not on file    Non-medical: Not on file  Tobacco Use  . Smoking status: Former Smoker    Packs/day: 0.12    Years: 10.00    Pack years: 1.20    Types: Cigarettes    Last attempt to quit: 12/10/1990    Years since quitting: 28.4  . Smokeless tobacco: Never Used  Substance and Sexual Activity  . Alcohol use: Never    Alcohol/week: 0.0 standard drinks    Frequency: Never  . Drug use: Yes    Types: Marijuana    Comment: last use 2 days ago  . Sexual activity: Not Currently  Lifestyle  . Physical activity:    Days per week: Not on file    Minutes per session: Not on file   . Stress: Not on file  Relationships  . Social connections:    Talks on phone: Not on file    Gets together: Not on file    Attends religious service: Not on file    Active member of club or organization: Not on file    Attends meetings of clubs or organizations: Not on file    Relationship status: Not on file  . Intimate partner violence:    Fear of current or ex partner: Not on file    Emotionally abused: Not on file    Physically abused: Not on file    Forced sexual activity: Not on file  Other Topics Concern  . Not on file  Social History Narrative   Regular exercise- no    Diet- lots of mountain dew, limits fast foods      Physical Exam: Unable to perform because this was a "telemed visit" due to current Covid-19 pandemic  Assessment and plan: 58 y.o. female with chronic nausea and vomiting, routine risk for colon cancer  First I do suspect that she has diabetic related gastroparesis.  Hemoglobin A1c ranges anywhere from 12-15 over the past 6 months, obviously her diabetes is not under control.  I explained to her that it will be very difficult to help her nausea and intermittent vomiting while her blood sugars are so chronically elevated.  She takes Reglan 5 mg before breakfast lunch and dinner.  She will continue that.  I recommended she take 10 mg at bedtime every night since she tends to wake up with a lot of nausea just about every morning.  I would also like to document gastroparesis since it really never has been and in  fact gastric emptying scan in 2018 was normal.  We will order a gastric emptying scan to be repeated now.  Lastly I explained to her that marijuana may contribute to chronic nausea and vomiting.  She smokes 3 or 4 times a week and I recommended she consider cutting back to see if that helps.  Her last colon cancer screening test was by colonoscopy in 2009.  She asked about getting up-to-date on screening and we will arrange for colonoscopy at her soonest  convenience now.  Please see the "Patient Instructions" section for addition details about the plan.  Owens Loffler, MD [facility] 04/29/2019, 10:20 AM

## 2019-04-30 NOTE — Progress Notes (Signed)
Left message asking pt to call office  5/21/rbh

## 2019-05-06 NOTE — Progress Notes (Signed)
Left message asking pt to call office 5/27/rbh

## 2019-05-15 ENCOUNTER — Other Ambulatory Visit: Payer: Self-pay | Admitting: Cardiology

## 2019-05-15 ENCOUNTER — Telehealth: Payer: Self-pay | Admitting: *Deleted

## 2019-05-15 DIAGNOSIS — I4891 Unspecified atrial fibrillation: Secondary | ICD-10-CM

## 2019-05-15 DIAGNOSIS — I10 Essential (primary) hypertension: Secondary | ICD-10-CM

## 2019-05-15 DIAGNOSIS — R0683 Snoring: Secondary | ICD-10-CM

## 2019-05-15 NOTE — Telephone Encounter (Signed)
PA submitted to [facility] via web portal for sleep study.

## 2019-05-15 NOTE — Telephone Encounter (Signed)
-----   Message from Minus Breeding, MD sent at 05/10/2019  2:23 PM EDT ----- You can change the order to do it now.  Thanks.  Jake  ----- Message ----- From: Lauralee Evener, CMA Sent: 04/28/2019   4:21 PM EDT To: Minus Breeding, MD  If we wait until fall to do sleep study patient will have to have another OV with either you or TK to  re document symptoms. ----- Message ----- From: Vennie Homans Sent: 04/10/2019  11:01 AM EDT To: Lauralee Evener, CMA  Pt need sleep study in fall  Thanks  Nya

## 2019-05-20 ENCOUNTER — Telehealth: Payer: Self-pay | Admitting: *Deleted

## 2019-05-20 ENCOUNTER — Other Ambulatory Visit: Payer: Self-pay | Admitting: Cardiology

## 2019-05-20 DIAGNOSIS — I4891 Unspecified atrial fibrillation: Secondary | ICD-10-CM

## 2019-05-20 DIAGNOSIS — I10 Essential (primary) hypertension: Secondary | ICD-10-CM

## 2019-05-20 DIAGNOSIS — R0683 Snoring: Secondary | ICD-10-CM

## 2019-05-20 NOTE — Telephone Encounter (Signed)
Patient notified [employer] denied in lab sleep study. HST scheduled 06/10/19@ 1:30 pm.

## 2019-05-20 NOTE — Telephone Encounter (Signed)
-----   Message from Minus Breeding, MD sent at 05/10/2019  2:23 PM EDT ----- You can change the order to do it now.  Thanks.  Jake  ----- Message ----- From: Lauralee Evener, CMA Sent: 04/28/2019   4:21 PM EDT To: Minus Breeding, MD  If we wait until fall to do sleep study patient will have to have another OV with either you or TK to  re document symptoms. ----- Message ----- From: Vennie Homans Sent: 04/10/2019  11:01 AM EDT To: Lauralee Evener, CMA  Pt need sleep study in fall  Thanks  Nya

## 2019-05-22 ENCOUNTER — Encounter (HOSPITAL_COMMUNITY): Payer: 59

## 2019-05-22 ENCOUNTER — Telehealth: Payer: Self-pay

## 2019-05-22 NOTE — Telephone Encounter (Signed)
Left message and a call back number

## 2019-05-25 ENCOUNTER — Other Ambulatory Visit: Payer: Self-pay

## 2019-05-25 ENCOUNTER — Encounter: Payer: Self-pay | Admitting: Gastroenterology

## 2019-05-25 ENCOUNTER — Ambulatory Visit (AMBULATORY_SURGERY_CENTER): Payer: 59 | Admitting: Gastroenterology

## 2019-05-25 VITALS — BP 126/83 | HR 79 | Temp 98.5°F | Resp 15 | Ht 59.0 in | Wt 142.0 lb

## 2019-05-25 DIAGNOSIS — Z1211 Encounter for screening for malignant neoplasm of colon: Secondary | ICD-10-CM | POA: Diagnosis not present

## 2019-05-25 DIAGNOSIS — K5730 Diverticulosis of large intestine without perforation or abscess without bleeding: Secondary | ICD-10-CM

## 2019-05-25 MED ORDER — SODIUM CHLORIDE 0.9 % IV SOLN: 500.0000 mL | Freq: Once | INTRAVENOUS | Status: DC

## 2019-05-25 NOTE — Patient Instructions (Signed)
Discharge instructions given. Handout on Diverticulosis. Resume previous medications. YOU HAD AN ENDOSCOPIC PROCEDURE TODAY AT THE [facility]:   Refer to the procedure report that was given to you for any specific questions about what was found during the examination.  If the procedure report does not answer your questions, please call your gastroenterologist to clarify.  If you requested that your care partner not be given the details of your procedure findings, then the procedure report has been included in a sealed envelope for you to review at your convenience later.  YOU SHOULD EXPECT: Some feelings of bloating in the abdomen. Passage of more gas than usual.  Walking can help get rid of the air that was put into your GI tract during the procedure and reduce the bloating. If you had a lower endoscopy (such as a colonoscopy or flexible sigmoidoscopy) you may notice spotting of blood in your stool or on the toilet paper. If you underwent a bowel prep for your procedure, you may not have a normal bowel movement for a few days.  Please Note:  You might notice some irritation and congestion in your nose or some drainage.  This is from the oxygen used during your procedure.  There is no need for concern and it should clear up in a day or so.  SYMPTOMS TO REPORT IMMEDIATELY:   Following lower endoscopy (colonoscopy or flexible sigmoidoscopy):  Excessive amounts of blood in the stool  Significant tenderness or worsening of abdominal pains  Swelling of the abdomen that is new, acute  Fever of 100F or higher   For urgent or emergent issues, a gastroenterologist can be reached at any hour by calling [phone].   DIET:  We do recommend a small meal at first, but then you may proceed to your regular diet.  Drink plenty of fluids but you should avoid alcoholic beverages for 24 hours.  ACTIVITY:  You should plan to take it easy for the rest of today and you should NOT DRIVE or use  heavy machinery until tomorrow (because of the sedation medicines used during the test).    FOLLOW UP: Our staff will call the number listed on your records 48-72 hours following your procedure to check on you and address any questions or concerns that you may have regarding the information given to you following your procedure. If we do not reach you, we will leave a message.  We will attempt to reach you two times.  During this call, we will ask if you have developed any symptoms of COVID 19. If you develop any symptoms (ie: fever, flu-like symptoms, shortness of breath, cough etc.) before then, please call [phone].  If you test positive for Covid 19 in the 2 weeks post procedure, please call and report this information to us.    If any biopsies were taken you will be contacted by phone or by letter within the next 1-3 weeks.  Please call us at [phone] if you have not heard about the biopsies in 3 weeks.    SIGNATURES/CONFIDENTIALITY: You and/or your care partner have signed paperwork which will be entered into your electronic medical record.  These signatures attest to the fact that that the information above on your After Visit Summary has been reviewed and is understood.  Full responsibility of the confidentiality of this discharge information lies with you and/or your care-partner. 

## 2019-05-25 NOTE — Progress Notes (Signed)
Report to PACU, RN, vss, BBS= Clear.  

## 2019-05-25 NOTE — Op Note (Signed)
[facility] Patient Name: [name] [name] Procedure Date: 05/25/2019 9:31 AM MRN: [id number] Endoscopist: Milus Banister , MD Age: 58 Referring MD:  Date of Birth: Sep 20, 1961 Gender: Female Account #: [id number] Procedure:                Colonoscopy Indications:              Screening for colorectal malignant neoplasm Medicines:                Monitored Anesthesia Care Procedure:                Pre-Anesthesia Assessment:                           - Prior to the procedure, a History and Physical                            was performed, and patient medications and                            allergies were reviewed. The patient's tolerance of                            previous anesthesia was also reviewed. The risks                            and benefits of the procedure and the sedation                            options and risks were discussed with the patient.                            All questions were answered, and informed consent                            was obtained. Prior Anticoagulants: The patient has                            taken no previous anticoagulant or antiplatelet                            agents. ASA Grade Assessment: II - A patient with                            mild systemic disease. After reviewing the risks                            and benefits, the patient was deemed in                            satisfactory condition to undergo the procedure.                           After obtaining informed consent, the colonoscope  was passed under direct vision. Throughout the                            procedure, the patient's blood pressure, pulse, and                            oxygen saturations were monitored continuously. The                            Colonoscope was introduced through the anus and                            advanced to the the cecum, identified by                            appendiceal orifice and  ileocecal valve. The                            colonoscopy was performed without difficulty. The                            patient tolerated the procedure well. The quality                            of the bowel preparation was good. The ileocecal                            valve, appendiceal orifice, and rectum were                            photographed. Scope In: 9:36:00 AM Scope Out: 9:45:34 AM Scope Withdrawal Time: 0 hours 6 minutes 34 seconds  Total Procedure Duration: 0 hours 9 minutes 34 seconds  Findings:                 Multiple small and large-mouthed diverticula were                            found in the entire colon.                           No polyps or cancers.                           The exam was otherwise without abnormality on                            direct and retroflexion views. Complications:            No immediate complications. Estimated blood loss:                            None. Estimated Blood Loss:     Estimated blood loss: none. Impression:               - Diverticulosis in the entire examined colon.                           -  The examination was otherwise normal on direct                            and retroflexion views.                           - No polyps or cancers. Recommendation:           - Patient has a contact number available for                            emergencies. The signs and symptoms of potential                            delayed complications were discussed with the                            patient. Return to normal activities tomorrow.                            Written discharge instructions were provided to the                            patient.                           - Resume previous diet.                           - Continue present medications.                           - Repeat colonoscopy in 10 years for screening                            purposes. Milus Banister, MD 05/25/2019 9:49:42 AM This report  has been signed electronically.

## 2019-05-27 ENCOUNTER — Telehealth: Payer: Self-pay

## 2019-05-27 NOTE — Telephone Encounter (Signed)
NO ANSWER, MESSAGE LEFT FOR PATIENT. 

## 2019-05-28 NOTE — Progress Notes (Signed)
Left message asking pt to call office 6/18/rbh

## 2019-05-30 ENCOUNTER — Encounter (HOSPITAL_COMMUNITY): Payer: Self-pay | Admitting: Emergency Medicine

## 2019-05-30 ENCOUNTER — Other Ambulatory Visit: Payer: Self-pay

## 2019-05-30 ENCOUNTER — Inpatient Hospital Stay (HOSPITAL_COMMUNITY)
Admission: EM | Admit: 2019-05-30 | Discharge: 2019-06-03 | DRG: 638 | Disposition: A | Discharge status: Home / Self Care | Payer: 59 | Attending: Internal Medicine | Admitting: Internal Medicine

## 2019-05-30 ENCOUNTER — Observation Stay (HOSPITAL_COMMUNITY): Payer: 59

## 2019-05-30 DIAGNOSIS — F411 Generalized anxiety disorder: Secondary | ICD-10-CM | POA: Diagnosis present

## 2019-05-30 DIAGNOSIS — K269 Duodenal ulcer, unspecified as acute or chronic, without hemorrhage or perforation: Secondary | ICD-10-CM

## 2019-05-30 DIAGNOSIS — E1022 Type 1 diabetes mellitus with diabetic chronic kidney disease: Secondary | ICD-10-CM | POA: Diagnosis present

## 2019-05-30 DIAGNOSIS — E1043 Type 1 diabetes mellitus with diabetic autonomic (poly)neuropathy: Secondary | ICD-10-CM | POA: Diagnosis present

## 2019-05-30 DIAGNOSIS — R1110 Vomiting, unspecified: Secondary | ICD-10-CM

## 2019-05-30 DIAGNOSIS — K208 Other esophagitis: Secondary | ICD-10-CM | POA: Diagnosis present

## 2019-05-30 DIAGNOSIS — E1143 Type 2 diabetes mellitus with diabetic autonomic (poly)neuropathy: Secondary | ICD-10-CM | POA: Diagnosis not present

## 2019-05-30 DIAGNOSIS — D72829 Elevated white blood cell count, unspecified: Secondary | ICD-10-CM

## 2019-05-30 DIAGNOSIS — E1110 Type 2 diabetes mellitus with ketoacidosis without coma: Secondary | ICD-10-CM | POA: Diagnosis present

## 2019-05-30 DIAGNOSIS — E1010 Type 1 diabetes mellitus with ketoacidosis without coma: Principal | ICD-10-CM | POA: Diagnosis present

## 2019-05-30 DIAGNOSIS — K2980 Duodenitis without bleeding: Secondary | ICD-10-CM

## 2019-05-30 DIAGNOSIS — E1100 Type 2 diabetes mellitus with hyperosmolarity without nonketotic hyperglycemic-hyperosmolar coma (NKHHC): Secondary | ICD-10-CM

## 2019-05-30 DIAGNOSIS — Z833 Family history of diabetes mellitus: Secondary | ICD-10-CM

## 2019-05-30 DIAGNOSIS — K529 Noninfective gastroenteritis and colitis, unspecified: Secondary | ICD-10-CM | POA: Diagnosis present

## 2019-05-30 DIAGNOSIS — E785 Hyperlipidemia, unspecified: Secondary | ICD-10-CM | POA: Diagnosis present

## 2019-05-30 DIAGNOSIS — K3184 Gastroparesis: Secondary | ICD-10-CM

## 2019-05-30 DIAGNOSIS — K209 Esophagitis, unspecified without bleeding: Secondary | ICD-10-CM

## 2019-05-30 DIAGNOSIS — K828 Other specified diseases of gallbladder: Secondary | ICD-10-CM | POA: Diagnosis present

## 2019-05-30 DIAGNOSIS — E039 Hypothyroidism, unspecified: Secondary | ICD-10-CM | POA: Diagnosis not present

## 2019-05-30 DIAGNOSIS — Z87891 Personal history of nicotine dependence: Secondary | ICD-10-CM

## 2019-05-30 DIAGNOSIS — R109 Unspecified abdominal pain: Secondary | ICD-10-CM

## 2019-05-30 DIAGNOSIS — E876 Hypokalemia: Secondary | ICD-10-CM | POA: Diagnosis present

## 2019-05-30 DIAGNOSIS — Z90710 Acquired absence of both cervix and uterus: Secondary | ICD-10-CM

## 2019-05-30 DIAGNOSIS — F329 Major depressive disorder, single episode, unspecified: Secondary | ICD-10-CM | POA: Diagnosis present

## 2019-05-30 DIAGNOSIS — R112 Nausea with vomiting, unspecified: Secondary | ICD-10-CM | POA: Diagnosis present

## 2019-05-30 DIAGNOSIS — Z794 Long term (current) use of insulin: Secondary | ICD-10-CM

## 2019-05-30 DIAGNOSIS — Z79890 Hormone replacement therapy (postmenopausal): Secondary | ICD-10-CM

## 2019-05-30 DIAGNOSIS — D751 Secondary polycythemia: Secondary | ICD-10-CM | POA: Diagnosis present

## 2019-05-30 DIAGNOSIS — Z7982 Long term (current) use of aspirin: Secondary | ICD-10-CM

## 2019-05-30 DIAGNOSIS — Z1159 Encounter for screening for other viral diseases: Secondary | ICD-10-CM

## 2019-05-30 DIAGNOSIS — E1165 Type 2 diabetes mellitus with hyperglycemia: Secondary | ICD-10-CM | POA: Diagnosis present

## 2019-05-30 DIAGNOSIS — Z79899 Other long term (current) drug therapy: Secondary | ICD-10-CM

## 2019-05-30 DIAGNOSIS — N182 Chronic kidney disease, stage 2 (mild): Secondary | ICD-10-CM | POA: Diagnosis present

## 2019-05-30 DIAGNOSIS — E861 Hypovolemia: Secondary | ICD-10-CM | POA: Diagnosis present

## 2019-05-30 DIAGNOSIS — K219 Gastro-esophageal reflux disease without esophagitis: Secondary | ICD-10-CM | POA: Diagnosis present

## 2019-05-30 DIAGNOSIS — I130 Hypertensive heart and chronic kidney disease with heart failure and stage 1 through stage 4 chronic kidney disease, or unspecified chronic kidney disease: Secondary | ICD-10-CM | POA: Diagnosis present

## 2019-05-30 DIAGNOSIS — I509 Heart failure, unspecified: Secondary | ICD-10-CM | POA: Diagnosis present

## 2019-05-30 LAB — CBC
HCT: 48.7 % — ABNORMAL HIGH (ref 36.0–46.0)
Hemoglobin: 16.9 g/dL — ABNORMAL HIGH (ref 12.0–15.0)
MCH: 28.7 pg (ref 26.0–34.0)
MCHC: 34.7 g/dL (ref 30.0–36.0)
MCV: 82.7 fL (ref 80.0–100.0)
Platelets: 430 10*3/uL — ABNORMAL HIGH (ref 150–400)
RBC: 5.89 MIL/uL — ABNORMAL HIGH (ref 3.87–5.11)
RDW: 15.3 % (ref 11.5–15.5)
WBC: 18.5 10*3/uL — ABNORMAL HIGH (ref 4.0–10.5)
nRBC: 0 % (ref 0.0–0.2)

## 2019-05-30 LAB — POCT I-STAT EG7
Bicarbonate: 21.9 mmol/L (ref 20.0–28.0)
Calcium, Ion: 1.12 mmol/L — ABNORMAL LOW (ref 1.15–1.40)
HCT: 50 % — ABNORMAL HIGH (ref 36.0–46.0)
Hemoglobin: 17 g/dL — ABNORMAL HIGH (ref 12.0–15.0)
O2 Saturation: 93 %
Potassium: 3.5 mmol/L (ref 3.5–5.1)
Sodium: 137 mmol/L (ref 135–145)
TCO2: 23 mmol/L (ref 22–32)
pCO2, Ven: 28.5 mmHg — ABNORMAL LOW (ref 44.0–60.0)
pH, Ven: 7.492 — ABNORMAL HIGH (ref 7.250–7.430)
pO2, Ven: 61 mmHg — ABNORMAL HIGH (ref 32.0–45.0)

## 2019-05-30 LAB — URINALYSIS, ROUTINE W REFLEX MICROSCOPIC
Bilirubin Urine: NEGATIVE
Glucose, UA: >=500 mg/dL — AB
Hgb urine dipstick: NEGATIVE
Ketones, ur: 20 mg/dL — AB
Leukocytes,Ua: NEGATIVE
Nitrite: NEGATIVE
Protein, ur: 100 mg/dL — AB
Specific Gravity, Urine: 1.031 — ABNORMAL HIGH (ref 1.005–1.030)
pH: 6 (ref 5.0–8.0)

## 2019-05-30 LAB — GLUCOSE, CAPILLARY
Glucose-Capillary: 352 mg/dL — ABNORMAL HIGH (ref 70–99)
Glucose-Capillary: 359 mg/dL — ABNORMAL HIGH (ref 70–99)
Glucose-Capillary: 278 mg/dL — ABNORMAL HIGH (ref 70–99)
Glucose-Capillary: 296 mg/dL — ABNORMAL HIGH (ref 70–99)
Glucose-Capillary: 209 mg/dL — ABNORMAL HIGH (ref 70–99)
Glucose-Capillary: 169 mg/dL — ABNORMAL HIGH (ref 70–99)
Glucose-Capillary: 153 mg/dL — ABNORMAL HIGH (ref 70–99)
Glucose-Capillary: 226 mg/dL — ABNORMAL HIGH (ref 70–99)
Glucose-Capillary: 217 mg/dL — ABNORMAL HIGH (ref 70–99)
Glucose-Capillary: 173 mg/dL — ABNORMAL HIGH (ref 70–99)

## 2019-05-30 LAB — BASIC METABOLIC PANEL
Anion gap: 20 — ABNORMAL HIGH (ref 5–15)
Anion gap: 12 (ref 5–15)
Anion gap: 13 (ref 5–15)
BUN: 14 mg/dL (ref 6–20)
BUN: 12 mg/dL (ref 6–20)
BUN: 11 mg/dL (ref 6–20)
CO2: 18 mmol/L — ABNORMAL LOW (ref 22–32)
CO2: 23 mmol/L (ref 22–32)
CO2: 24 mmol/L (ref 22–32)
Calcium: 10 mg/dL (ref 8.9–10.3)
Calcium: 8.9 mg/dL (ref 8.9–10.3)
Calcium: 8.7 mg/dL — ABNORMAL LOW (ref 8.9–10.3)
Chloride: 101 mmol/L (ref 98–111)
Chloride: 101 mmol/L (ref 98–111)
Chloride: 103 mmol/L (ref 98–111)
Creatinine, Ser: 0.66 mg/dL (ref 0.44–1.00)
Creatinine, Ser: 0.6 mg/dL (ref 0.44–1.00)
Creatinine, Ser: 0.5 mg/dL (ref 0.44–1.00)
GFR calc Af Amer: >60 mL/min (ref >60)
GFR calc Af Amer: >60 mL/min (ref >60)
GFR calc Af Amer: >60 mL/min (ref >60)
GFR calc non Af Amer: >60 mL/min (ref >60)
GFR calc non Af Amer: >60 mL/min (ref >60)
GFR calc non Af Amer: >60 mL/min (ref >60)
Glucose, Bld: 437 mg/dL — ABNORMAL HIGH (ref 70–99)
Glucose, Bld: 341 mg/dL — ABNORMAL HIGH (ref 70–99)
Glucose, Bld: 166 mg/dL — ABNORMAL HIGH (ref 70–99)
Potassium: 3.5 mmol/L (ref 3.5–5.1)
Potassium: 3.3 mmol/L — ABNORMAL LOW (ref 3.5–5.1)
Potassium: 3 mmol/L — ABNORMAL LOW (ref 3.5–5.1)
Sodium: 139 mmol/L (ref 135–145)
Sodium: 136 mmol/L (ref 135–145)
Sodium: 140 mmol/L (ref 135–145)

## 2019-05-30 LAB — CBG MONITORING, ED
Glucose-Capillary: 457 mg/dL — ABNORMAL HIGH (ref 70–99)
Glucose-Capillary: 335 mg/dL — ABNORMAL HIGH (ref 70–99)
Glucose-Capillary: 344 mg/dL — ABNORMAL HIGH (ref 70–99)

## 2019-05-30 LAB — I-STAT BETA HCG BLOOD, ED (MC, WL, AP ONLY): I-stat hCG, quantitative: <5 m[IU]/mL (ref <5)

## 2019-05-30 LAB — BETA-HYDROXYBUTYRIC ACID: Beta-Hydroxybutyric Acid: 0.45 mmol/L — ABNORMAL HIGH (ref 0.05–0.27)

## 2019-05-30 LAB — SARS CORONAVIRUS 2 BY RT PCR (HOSPITAL ORDER, PERFORMED IN ~~LOC~~ HOSPITAL LAB): SARS Coronavirus 2: NEGATIVE

## 2019-05-30 LAB — MRSA PCR SCREENING: MRSA by PCR: NEGATIVE

## 2019-05-30 MED ORDER — ONDANSETRON HCL 4 MG/2ML IJ SOLN
4.0000 mg | Freq: Four times a day (QID) | INTRAMUSCULAR | Status: DC | PRN
  Administered 2019-05-30 – 2019-05-31 (×2): 4 mg via INTRAVENOUS
  Filled 2019-05-30 (×2): qty 2

## 2019-05-30 MED ORDER — METOPROLOL SUCCINATE ER 25 MG PO TB24
25.0000 mg | ORAL_TABLET | Freq: Every day | ORAL | Status: DC
  Administered 2019-06-01 – 2019-06-03 (×3): 25 mg via ORAL
  Filled 2019-05-30 (×4): qty 1

## 2019-05-30 MED ORDER — SODIUM CHLORIDE 0.9 % IV SOLN
INTRAVENOUS | Status: DC
  Administered 2019-05-30: 06:00:00 via INTRAVENOUS

## 2019-05-30 MED ORDER — SODIUM CHLORIDE 0.9 % IV BOLUS
1000.0000 mL | Freq: Once | INTRAVENOUS | Status: AC
  Administered 2019-05-30: 1000 mL via INTRAVENOUS

## 2019-05-30 MED ORDER — PANTOPRAZOLE SODIUM 40 MG PO TBEC
40.0000 mg | DELAYED_RELEASE_TABLET | Freq: Every day | ORAL | Status: DC
  Filled 2019-05-30: qty 1

## 2019-05-30 MED ORDER — ROSUVASTATIN CALCIUM 20 MG PO TABS
40.0000 mg | ORAL_TABLET | Freq: Every day | ORAL | Status: DC
  Administered 2019-06-01 – 2019-06-03 (×3): 40 mg via ORAL
  Filled 2019-05-30 (×3): qty 2

## 2019-05-30 MED ORDER — ENOXAPARIN SODIUM 40 MG/0.4ML ~~LOC~~ SOLN
40.0000 mg | SUBCUTANEOUS | Status: DC
  Administered 2019-05-30 – 2019-06-01 (×2): 40 mg via SUBCUTANEOUS
  Filled 2019-05-30 (×2): qty 0.4

## 2019-05-30 MED ORDER — INSULIN GLARGINE 100 UNIT/ML ~~LOC~~ SOLN
24.0000 [IU] | Freq: Every day | SUBCUTANEOUS | Status: DC
  Administered 2019-05-30: 24 [IU] via SUBCUTANEOUS
  Filled 2019-05-30 (×2): qty 0.24

## 2019-05-30 MED ORDER — SODIUM CHLORIDE 0.9 % IV SOLN
INTRAVENOUS | Status: DC
  Administered 2019-05-30: 04:00:00 via INTRAVENOUS

## 2019-05-30 MED ORDER — POTASSIUM CHLORIDE CRYS ER 20 MEQ PO TBCR
40.0000 meq | EXTENDED_RELEASE_TABLET | Freq: Once | ORAL | Status: DC
  Filled 2019-05-30: qty 2

## 2019-05-30 MED ORDER — DEXTROSE-NACL 5-0.45 % IV SOLN
INTRAVENOUS | Status: DC
  Administered 2019-05-30: 09:00:00 via INTRAVENOUS

## 2019-05-30 MED ORDER — INSULIN ASPART 100 UNIT/ML ~~LOC~~ SOLN
0.0000 [IU] | Freq: Three times a day (TID) | SUBCUTANEOUS | Status: DC
  Administered 2019-05-30 (×2): 3 [IU] via SUBCUTANEOUS
  Administered 2019-05-31: 2 [IU] via SUBCUTANEOUS
  Administered 2019-05-31: 3 [IU] via SUBCUTANEOUS
  Administered 2019-05-31 – 2019-06-01 (×3): 1 [IU] via SUBCUTANEOUS
  Administered 2019-06-02: 5 [IU] via SUBCUTANEOUS
  Administered 2019-06-03: 2 [IU] via SUBCUTANEOUS
  Administered 2019-06-03: 1 [IU] via SUBCUTANEOUS

## 2019-05-30 MED ORDER — LEVOTHYROXINE SODIUM 75 MCG PO TABS
150.0000 ug | ORAL_TABLET | Freq: Every day | ORAL | Status: DC
  Administered 2019-05-30 – 2019-06-02 (×3): 150 ug via ORAL
  Filled 2019-05-30 (×4): qty 2

## 2019-05-30 MED ORDER — DEXTROSE-NACL 5-0.45 % IV SOLN: INTRAVENOUS | Status: DC

## 2019-05-30 MED ORDER — CHLORHEXIDINE GLUCONATE CLOTH 2 % EX PADS
6.0000 | MEDICATED_PAD | Freq: Every day | CUTANEOUS | Status: DC
  Administered 2019-05-30 – 2019-06-01 (×3): 6 via TOPICAL

## 2019-05-30 MED ORDER — ASPIRIN 81 MG PO CHEW
81.0000 mg | CHEWABLE_TABLET | Freq: Every day | ORAL | Status: DC
  Administered 2019-06-01 – 2019-06-02 (×2): 81 mg via ORAL
  Filled 2019-05-30 (×3): qty 1

## 2019-05-30 MED ORDER — DEXTROSE 50 % IV SOLN: 25.0000 mL | INTRAVENOUS | Status: DC | PRN

## 2019-05-30 MED ORDER — INSULIN REGULAR(HUMAN) IN NACL 100-0.9 UT/100ML-% IV SOLN
INTRAVENOUS | Status: DC
  Filled 2019-05-30: qty 100

## 2019-05-30 MED ORDER — PROMETHAZINE HCL 25 MG/ML IJ SOLN
25.0000 mg | Freq: Once | INTRAMUSCULAR | Status: AC
  Administered 2019-05-30: 25 mg via INTRAVENOUS
  Filled 2019-05-30: qty 1

## 2019-05-30 MED ORDER — SODIUM CHLORIDE 0.9 % IV BOLUS
500.0000 mL | Freq: Once | INTRAVENOUS | Status: AC
  Administered 2019-05-30: 500 mL via INTRAVENOUS

## 2019-05-30 MED ORDER — INSULIN REGULAR BOLUS VIA INFUSION
0.0000 [IU] | Freq: Three times a day (TID) | INTRAVENOUS | Status: DC
  Filled 2019-05-30: qty 10

## 2019-05-30 MED ORDER — LACTATED RINGERS IV BOLUS
1000.0000 mL | Freq: Once | INTRAVENOUS | Status: AC
  Administered 2019-05-30: 1000 mL via INTRAVENOUS

## 2019-05-30 MED ORDER — SERTRALINE HCL 100 MG PO TABS
100.0000 mg | ORAL_TABLET | Freq: Every day | ORAL | Status: DC
  Administered 2019-06-01 – 2019-06-02 (×2): 100 mg via ORAL
  Filled 2019-05-30 (×3): qty 1

## 2019-05-30 MED ORDER — METOCLOPRAMIDE HCL 5 MG/ML IJ SOLN
10.0000 mg | Freq: Three times a day (TID) | INTRAMUSCULAR | Status: DC
  Administered 2019-05-30 – 2019-06-01 (×7): 10 mg via INTRAVENOUS
  Filled 2019-05-30 (×7): qty 2

## 2019-05-30 MED ORDER — INSULIN ASPART 100 UNIT/ML ~~LOC~~ SOLN: 0.0000 [IU] | Freq: Every day | SUBCUTANEOUS | Status: DC

## 2019-05-30 MED ORDER — POTASSIUM CHLORIDE 10 MEQ/100ML IV SOLN
INTRAVENOUS | Status: AC
  Filled 2019-05-30: qty 100

## 2019-05-30 MED ORDER — LOSARTAN POTASSIUM 50 MG PO TABS: 25.0000 mg | ORAL_TABLET | Freq: Every day | ORAL | Status: DC

## 2019-05-30 MED ORDER — POTASSIUM CHLORIDE 10 MEQ/100ML IV SOLN
10.0000 meq | INTRAVENOUS | Status: AC
  Administered 2019-05-30 (×3): 10 meq via INTRAVENOUS
  Filled 2019-05-30 (×3): qty 100

## 2019-05-30 MED ORDER — INSULIN REGULAR(HUMAN) IN NACL 100-0.9 UT/100ML-% IV SOLN
INTRAVENOUS | Status: DC
  Administered 2019-05-30: 2.9 [IU]/h via INTRAVENOUS
  Filled 2019-05-30: qty 100

## 2019-05-30 MED ORDER — SODIUM CHLORIDE 0.9 % IV SOLN
INTRAVENOUS | Status: DC
  Administered 2019-05-30 – 2019-06-03 (×8): via INTRAVENOUS

## 2019-05-30 MED ORDER — HYDRALAZINE HCL 20 MG/ML IJ SOLN
10.0000 mg | Freq: Four times a day (QID) | INTRAMUSCULAR | Status: DC | PRN
  Administered 2019-05-30: 10 mg via INTRAVENOUS

## 2019-05-30 MED ORDER — POTASSIUM CHLORIDE 10 MEQ/100ML IV SOLN
10.0000 meq | INTRAVENOUS | Status: AC
  Administered 2019-05-30 (×3): 10 meq via INTRAVENOUS
  Filled 2019-05-30 (×2): qty 100

## 2019-05-30 MED ORDER — HYDRALAZINE HCL 20 MG/ML IJ SOLN
INTRAMUSCULAR | Status: AC
  Administered 2019-05-30: 10 mg via INTRAVENOUS
  Filled 2019-05-30: qty 1

## 2019-05-30 NOTE — Progress Notes (Signed)
PROGRESS NOTE    [name] [name]  PYY:[id number] DOB: 10/14/1961 DOA: 05/30/2019 PCP: Jinny Sanders, MD    Brief Narrative: 58 year old with past medical history significant for poorly controlled diabetes, insulin-dependent, diabetic gastroparesis, hypothyroidism, chronic leukocytosis who presents for evaluation of hyperglycemia, nausea and vomiting.  Patient acknowledged nonadherence to her recommended diet.  Evaluation in the ED patient was found to be in mild DKA with anion gap of 20 bicarb of 18 CBG in the 400 range.  Assessment & Plan:   Principal Problem:   DKA (diabetic ketoacidoses) ([place]) Active Problems:   Hypothyroidism   Generalized anxiety disorder   Gastroparesis due to DM ([name])   Nausea and vomiting   Uncontrolled diabetes mellitus with hyperosmolarity, with long-term current use of insulin ([name])  1-Diabetes, DKA: Patient presented with anion gap of 20, CO2 18, glucose 437. Continue with insulin drip IV fluids. Follow be met every 4 hours, replete potassium as needed. UA negative for infection. Check chest x ray.   2-leukocytosis, acute on chronic.  UA negative. Check chest x ray.   3-hemoconcentration: Related to hypovolemia.  Hemoglobin at 17.  Continue with IV fluids. IV bolus/   4-nausea, vomiting: Diabetic gastroparesis: Continue with Reglan, as needed Zofran.  5-depression: Continue with Zoloft  6-hypertension: Continue with losartan and metoprolol. Continue with PRN hydralazine.  7-hypothyroidism: Continue with Synthroid.   DVT prophylaxis: Lovenox Code Status: full code Family Communication: care discussed with patient.  Disposition Plan:  Remain in the ICU for treatment of DKA, still on insulin gtt  Consultants:  none  Procedures:   none   Antimicrobials: none   Subjective: Patient denies chest pain, dyspnea, cough.  She report Diarrhea yesterday, loose , 3 episodes. Denies abdominal pain.  She had colonoscopy on the 16, only  showed diverticulosis.  She started feel sick after she had lunch with granddaughter on Monday   Objective: Vitals:   05/30/19 0400 05/30/19 0415 05/30/19 0546 05/30/19 0600  BP: 104/62 (!) 151/100  (!) 195/141  Pulse: 82 100  (!) 124  Resp:  10  19  Temp:   98.6 F (37 C)   TempSrc:   Oral   SpO2: 96% 99%  99%  Weight:   61.7 kg   Height:   4' 11" (1.499 m)     Intake/Output Summary (Last 24 hours) at 05/30/2019 0709 Last data filed at 05/30/2019 0702 Gross per 24 hour  Intake 1259.05 ml  Output 200 ml  Net 1059.05 ml   Filed Weights   05/30/19 0206 05/30/19 0546  Weight: 63.5 kg 61.7 kg    Examination:  General exam: Appears calm and comfortable  Respiratory system: Clear to auscultation. Respiratory effort normal. Cardiovascular system: S1 & S2 heard, RRR. No JVD, murmurs, rubs, gallops or clicks. No pedal edema. Gastrointestinal system: Abdomen is nondistended, soft and nontender. No organomegaly or masses felt. Normal bowel sounds heard. Central nervous system: Alert and oriented. No focal neurological deficits. Extremities: Symmetric 5 x 5 power. Skin: No rashes, lesions or ulcers Psychiatry: Judgement and insight appear normal. Mood & affect appropriate.     Data Reviewed: I have personally reviewed following labs and imaging studies  CBC: Recent Labs  Lab 05/30/19 0220 05/30/19 0324  WBC 18.5*  --   HGB 16.9* 17.0*  HCT 48.7* 50.0*  MCV 82.7  --   PLT 430*  --    Basic Metabolic Panel: Recent Labs  Lab 05/30/19 0220 05/30/19 0324  NA 139 137  K  3.5 3.5  CL 101  --   CO2 18*  --   GLUCOSE 437*  --   BUN 14  --   CREATININE 0.66  --   CALCIUM 10.0  --    GFR: Estimated Creatinine Clearance: 63.5 mL/min (by C-G formula based on SCr of 0.66 mg/dL). Liver Function Tests: No results for input(s): AST, ALT, ALKPHOS, BILITOT, PROT, ALBUMIN in the last 168 hours. No results for input(s): LIPASE, AMYLASE in the last 168 hours. No results for  input(s): AMMONIA in the last 168 hours. Coagulation Profile: No results for input(s): INR, PROTIME in the last 168 hours. Cardiac Enzymes: No results for input(s): CKTOTAL, CKMB, CKMBINDEX, TROPONINI in the last 168 hours. BNP (last 3 results) No results for input(s): PROBNP in the last 8760 hours. HbA1C: No results for input(s): HGBA1C in the last 72 hours. CBG: Recent Labs  Lab 05/30/19 0233 05/30/19 0421 05/30/19 0506 05/30/19 0550 05/30/19 0654  GLUCAP 457* 335* 344* 352* 359*   Lipid Profile: No results for input(s): CHOL, HDL, LDLCALC, TRIG, CHOLHDL, LDLDIRECT in the last 72 hours. Thyroid Function Tests: No results for input(s): TSH, T4TOTAL, FREET4, T3FREE, THYROIDAB in the last 72 hours. Anemia Panel: No results for input(s): VITAMINB12, FOLATE, FERRITIN, TIBC, IRON, RETICCTPCT in the last 72 hours. Sepsis Labs: No results for input(s): PROCALCITON, LATICACIDVEN in the last 168 hours.  Recent Results (from the past 240 hour(s))  SARS Coronavirus 2 (CEPHEID - Performed in [place] hospital lab), Hosp Order     Status: None   Collection Time: 05/30/19  4:55 AM   Specimen: Nasopharyngeal Swab  Result Value Ref Range Status   SARS Coronavirus 2 NEGATIVE NEGATIVE Final    Comment: (NOTE) If result is NEGATIVE SARS-CoV-2 target nucleic acids are NOT DETECTED. The SARS-CoV-2 RNA is generally detectable in upper and lower  respiratory specimens during the acute phase of infection. The lowest  concentration of SARS-CoV-2 viral copies this assay can detect is 250  copies / mL. A negative result does not preclude SARS-CoV-2 infection  and should not be used as the sole basis for treatment or other  patient management decisions.  A negative result may occur with  improper specimen collection / handling, submission of specimen other  than nasopharyngeal swab, presence of viral mutation(s) within the  areas targeted by this assay, and inadequate number of viral copies   (<250 copies / mL). A negative result must be combined with clinical  observations, patient history, and epidemiological information. If result is POSITIVE SARS-CoV-2 target nucleic acids are DETECTED. The SARS-CoV-2 RNA is generally detectable in upper and lower  respiratory specimens dur ing the acute phase of infection.  Positive  results are indicative of active infection with SARS-CoV-2.  Clinical  correlation with patient history and other diagnostic information is  necessary to determine patient infection status.  Positive results do  not rule out bacterial infection or co-infection with other viruses. If result is PRESUMPTIVE POSTIVE SARS-CoV-2 nucleic acids MAY BE PRESENT.   A presumptive positive result was obtained on the submitted specimen  and confirmed on repeat testing.  While 2019 novel coronavirus  (SARS-CoV-2) nucleic acids may be present in the submitted sample  additional confirmatory testing may be necessary for epidemiological  and / or clinical management purposes  to differentiate between  SARS-CoV-2 and other Sarbecovirus currently known to infect humans.  If clinically indicated additional testing with an alternate test  methodology [phone]) is advised. The SARS-CoV-2 RNA is generally  detectable in upper and lower respiratory sp ecimens during the acute  phase of infection. The expected result is Negative. Fact Sheet for Patients:  StrictlyIdeas.no Fact Sheet for Healthcare Providers: BankingDealers.co.za This test is not yet approved or cleared by the [place] FDA and has been authorized for detection and/or diagnosis of SARS-CoV-2 by FDA under an Emergency Use Authorization (EUA).  This EUA will remain in effect (meaning this test can be used) for the duration of the COVID-19 declaration under Section 564(b)(1) of the Act, 21 U.S.C. section 360bbb-3(b)(1), unless the authorization is terminated or  revoked sooner. Performed at [facility], [place] [address], [place], [place] [address]          Radiology Studies: No results found.      Scheduled Meds: . aspirin  81 mg Oral Daily  . Chlorhexidine Gluconate Cloth  6 each Topical Daily  . enoxaparin (LOVENOX) injection  40 mg Subcutaneous Q24H  . levothyroxine  150 mcg Oral QAC breakfast  . losartan  25 mg Oral Daily  . metoCLOPramide (REGLAN) injection  10 mg Intravenous Q8H  . metoprolol succinate  25 mg Oral Daily  . pantoprazole  40 mg Oral Q1200  . potassium chloride SA  40 mEq Oral Once  . rosuvastatin  40 mg Oral q1800  . sertraline  100 mg Oral Daily   Continuous Infusions: . sodium chloride 100 mL/hr at 05/30/19 0702  . dextrose 5 % and 0.45% NaCl    . insulin 6 Units/hr (05/30/19 0703)  . potassium chloride 10 mEq (05/30/19 0702)  . sodium chloride       LOS: 0 days    Time spent: 35 minutes.     Elmarie Shiley, MD [facility] Pager [phone]  If 7PM-7AM, please contact night-coverage [email or web address] Password [email or web address] 05/30/2019, 7:09 AM

## 2019-05-30 NOTE — ED Triage Notes (Signed)
[facility] transported pt from home and reports the following:.  Pt ate lunch at [facility] 05/29/19, felt ill around 1:00 PM. At 4:00 PM N/V/D started. Pt lost count of the number of times she vomited. At 1:00 AM 05/30/19 she checked her BG and 450. At 1:30 AM EMS checked BG 391. 12 Lead EKG unremarkable .

## 2019-05-30 NOTE — H&P (Signed)
History and Physical    LATHA STAUNTON SFK:[id number] DOB: 1961/10/18 DOA: 05/30/2019  PCP: Jinny Sanders, MD   Patient coming from: Home   Chief Complaint: Hyperglycemia, N/V   HPI: [name] [name] is a 58 y.o. female with medical history significant for poorly controlled insulin-dependent diabetes mellitus, diabetic gastroparesis, hypothyroidism, hypertension, chronic leukocytosis, and depression, now presenting to emergency department for evaluation of hyperglycemia, nausea, and vomiting.  Patient reports that she had a big lunch at [facility] yesterday, developed some nausea shortly after this, and then went on to develop worsening in her nausea with recurrent nonbloody vomiting and a loose stool.  She reports checking her glucose and finding it to be high.  States that she missed a dose of insulin earlier this week, but not in the past couple days.  She acknowledges nonadherence to her recommended diet.  She denies any fevers, chills, or abdominal pain.  She also denies chest pain, cough, or shortness of breath.  She describes her current symptoms as similar to her prior episodes of gastroparesis.  ED Course: Upon arrival to the ED, patient is found to be afebrile, saturating well on room air, and with stable blood pressure.  EKG features a sinus rhythm.  Chemistry panel is notable for glucose of 1 and 37, bicarbonate 18, and anion gap of 20.  CBC features a chronic stable leukocytosis, mild polycythemia, and mild thrombocytosis.  Urinalysis notable for glucosuria, ketonuria, and proteinuria.  Patient was given a liter of normal saline in the ED, supplemental potassium, Phenergan, and was started on insulin infusion.  Hospitalists are asked to admit.  Review of Systems:  All other systems reviewed and apart from HPI, are negative.  Past Medical History:  Diagnosis Date  . Allergy    seasonal  . Anxiety   . B12 deficiency   . Chest pain 04/05/2017  . CHF (congestive heart failure) ([place])    . Diabetes mellitus type 1, uncontrolled ([place])    "dr. just changed me to type 1, uncontrolled" (11/26/2018)  . DKA (diabetic ketoacidoses) ([place]) 05/25/2018  . GERD (gastroesophageal reflux disease)   . Hyperlipidemia   . Hypertension    [name]  . Hypothyroidism   . IBS (irritable bowel syndrome)   . Intermittent vertigo   . 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 Leg cramping    "@ night" (09/18/2013)  . Migraine    "once q couple months" (11/26/2018)  . Osteoporosis   . Peripheral neuropathy   . Umbilical hernia    unrepaired (09/18/2013)    Past Surgical History:  Procedure Laterality Date  . CESAREAN SECTION  1989  . LAPAROSCOPIC ASSISTED VAGINAL HYSTERECTOMY  09/23/2012   Procedure: LAPAROSCOPIC ASSISTED VAGINAL HYSTERECTOMY;  Surgeon: Linda Hedges, DO;  Location: [name];  Service: Gynecology;  Laterality: N/A;  pull Dr Gregor Hams instrument  . LEFT HEART CATH AND CORONARY ANGIOGRAPHY N/A 02/11/2017   Procedure: Left Heart Cath and Coronary Angiography;  Surgeon: Lorretta Harp, MD;  Location: [facility];  Service: Cardiovascular;  Laterality: N/A;  . TUBAL LIGATION Bilateral 1992     reports that she quit smoking about 28 years ago. Her smoking use included cigarettes. She has a 1.20 pack-year smoking history. She has never used smokeless tobacco. She reports current drug use. Drug: Marijuana. She reports that she does not drink alcohol.  Allergies  Allergen Reactions  . Codeine Hives, Anxiety and Other (See Comments)    Family History  Problem Relation Age of Onset  . Diabetes Other   . Heart disease Other   . Heart failure Mother 16       Her heart skipped a beat and stopped - per pt  . Diabetes Maternal Grandmother   . Alzheimer's disease Maternal Grandmother   . Coronary artery disease Maternal Grandmother   . Heart attack Maternal Grandfather   . Heart failure Brother   . Diabetes Brother   . Colon polyps Neg Hx   .  Esophageal cancer Neg Hx   . Liver cancer Neg Hx   . Pancreatic cancer Neg Hx   . Stomach cancer Neg Hx   . Crohn's disease Neg Hx   . Rectal cancer Neg Hx      Prior to Admission medications   Medication Sig Start Date End Date Taking? Authorizing Provider  acetaminophen (TYLENOL) 325 MG tablet Take 650 mg by mouth every 6 (six) hours as needed for mild pain, fever or headache.   Yes [provider]  aspirin 81 MG chewable tablet Chew 1 tablet (81 mg total) by mouth daily. 02/12/17  Yes Jani Gravel, MD  fexofenadine (ALLEGRA) 180 MG tablet Take 1 tablet (180 mg total) by mouth daily. 04/02/19  Yes Bedsole, Amy E, MD  furosemide (LASIX) 20 MG tablet Take 1 tablet (20 mg total) by mouth as needed for fluid. 04/29/19  Yes Minus Breeding, MD  insulin lispro (HUMALOG KWIKPEN) 100 UNIT/ML KiwkPen Inject into skin 3 times a day at meal time according to the following sliding scale: CBG 70-120 - 0 units / CBG 121-150 - 1 unit / CBG 151-200 - 2 units / CBG 201-250 - 3 units / CBG 251-300 - 5 units / CBG 301-350 - 7 units / CBG 351-400+ - 9 units  YOU MUST FOLLOW A STRICT DIABETIC DIET Patient taking differently: Inject into the skin See admin instructions. 8 units plus sliding scale Inject into skin 3 times a day at meal time according to the following sliding scale: CBG 70-120 - 0 units / CBG 121-150 - 1 unit / CBG 151-200 - 2 units / CBG 201-250 - 3 units / CBG 251-300 - 5 units / CBG 301-350 - 7 units / CBG 351-400+ - 9 units  YOU MUST FOLLOW A STRICT DIABETIC DIET 07/01/18  Yes Bedsole, Amy E, MD  levothyroxine (SYNTHROID, LEVOTHROID) 150 MCG tablet TAKE 1 TABLET BY MOUTH EVERY DAY Patient taking differently: Take 150 mcg by mouth daily before breakfast.  11/10/18  Yes Bedsole, Amy E, MD  losartan (COZAAR) 25 MG tablet TAKE 1 TABLET BY MOUTH EVERY DAY Patient taking differently: Take 25 mg by mouth daily.  03/17/19  Yes Barrett, Evelene Croon, PA-C  metoCLOPramide (REGLAN) 5 MG tablet  Take 5mg  before breakfast lunch and dinner and 10mg  at bedtime 04/29/19  Yes Milus Banister, MD  metoprolol succinate (TOPROL-XL) 25 MG 24 hr tablet TAKE 1 TABLET BY MOUTH EVERY DAY Patient taking differently: Take 25 mg by mouth daily.  10/06/18  Yes Minus Breeding, MD  nitroGLYCERIN (NITRODUR - DOSED IN MG/24 HR) 0.2 mg/hr patch APPLY 1 PATCH TO SKIN EVERY DAY AS NEEDED Patient taking differently: Place 0.2 mg onto the skin daily.  08/27/18  Yes Barrett, Evelene Croon, PA-C  pantoprazole (PROTONIX) 40 MG tablet Take 1 tablet (40 mg total) by mouth daily at 12 noon. 01/28/18  Yes Bedsole, Amy E, MD  promethazine (PHENERGAN) 25 MG tablet TAKE 1 TABLET BY MOUTH EVERY 8 HOURS AS  NEEDED FOR NAUSEA OR VOMITING. Patient taking differently: Take 25 mg by mouth every 8 (eight) hours as needed for vomiting.  04/01/19  Yes Bedsole, Amy E, MD  rosuvastatin (CRESTOR) 40 MG tablet Take 1 tablet (40 mg total) by mouth daily. 04/10/19 07/09/19 Yes Hochrein, Jeneen Rinks, MD  sertraline (ZOLOFT) 50 MG tablet TAKE 1 TABLET BY MOUTH EVERY DAY Patient taking differently: Take 100 mg by mouth daily.  12/19/18  Yes Bedsole, Amy E, MD  TRESIBA FLEXTOUCH 100 UNIT/ML SOPN FlexTouch Pen Inject 0.24 mLs (24 Units total) into the skin at bedtime. On 10/20/18 take at lunch time, on 10/21/18 take at dinner time. On 10/22/18 resume at bedtime regimen. 10/20/18  Yes Lavina Hamman, MD  Continuous Blood Gluc Sensor (FREESTYLE LIBRE 14 DAY SENSOR) MISC by Does not apply route. 04/01/19   [provider]  Insulin Pen Needle (PEN NEEDLES) 31G X 8 MM MISC Use to inject insulin 4 times a day.  Dx: E11.10 12/23/17   Jinny Sanders, MD  Lancets (ONETOUCH ULTRASOFT) lancets Use to check blood sugar daily as needed.  Dx: E11.10 12/23/17   Jinny Sanders, MD  ONETOUCH VERIO test strip USE TO CHECK BLOOD SUGAR DAILY AS NEEDED. DX: E11.10 09/21/18   Jinny Sanders, MD    Physical Exam: Vitals:   05/30/19 0325 05/30/19 0330 05/30/19 0400 05/30/19  0415  BP: 140/88 (!) 144/96 104/62 (!) 151/100  Pulse: 82 85 82 100  Resp:    10  Temp:      SpO2: 97% 99% 96% 99%  Weight:      Height:        Constitutional: NAD, calm  Eyes: PERTLA, lids and conjunctivae normal ENMT: Mucous membranes are moist. Posterior pharynx clear of any exudate or lesions.   Neck: normal, supple, no masses, no thyromegaly Respiratory: clear to auscultation bilaterally, no wheezing, no crackles. No accessory muscle use.  Cardiovascular: S1 & S2 heard, regular rate and rhythm. No extremity edema.   Abdomen: No distension, no tenderness, soft. Bowel sounds active.  Musculoskeletal: no clubbing / cyanosis. No joint deformity upper and lower extremities.  Skin: no significant rashes, lesions, ulcers. Warm, dry, well-perfused. Neurologic: CN 2-12 grossly intact. Sensation intact. Strength 5/5 in all 4 limbs.  Psychiatric:  Alert and oriented x 3. Calm, cooperative.    Labs on Admission: I have personally reviewed following labs and imaging studies  CBC: Recent Labs  Lab 05/30/19 0220 05/30/19 0324  WBC 18.5*  --   HGB 16.9* 17.0*  HCT 48.7* 50.0*  MCV 82.7  --   PLT 430*  --    Basic Metabolic Panel: Recent Labs  Lab 05/30/19 0220 05/30/19 0324  NA 139 137  K 3.5 3.5  CL 101  --   CO2 18*  --   GLUCOSE 437*  --   BUN 14  --   CREATININE 0.66  --   CALCIUM 10.0  --    GFR: Estimated Creatinine Clearance: 64.3 mL/min (by C-G formula based on SCr of 0.66 mg/dL). Liver Function Tests: No results for input(s): AST, ALT, ALKPHOS, BILITOT, PROT, ALBUMIN in the last 168 hours. No results for input(s): LIPASE, AMYLASE in the last 168 hours. No results for input(s): AMMONIA in the last 168 hours. Coagulation Profile: No results for input(s): INR, PROTIME in the last 168 hours. Cardiac Enzymes: No results for input(s): CKTOTAL, CKMB, CKMBINDEX, TROPONINI in the last 168 hours. BNP (last 3 results) No results for input(s): PROBNP  in the last 8760  hours. HbA1C: No results for input(s): HGBA1C in the last 72 hours. CBG: Recent Labs  Lab 05/30/19 0233 05/30/19 0421  GLUCAP 457* 335*   Lipid Profile: No results for input(s): CHOL, HDL, LDLCALC, TRIG, CHOLHDL, LDLDIRECT in the last 72 hours. Thyroid Function Tests: No results for input(s): TSH, T4TOTAL, FREET4, T3FREE, THYROIDAB in the last 72 hours. Anemia Panel: No results for input(s): VITAMINB12, FOLATE, FERRITIN, TIBC, IRON, RETICCTPCT in the last 72 hours. Urine analysis:    Component Value Date/Time   COLORURINE YELLOW 05/30/2019 0220   APPEARANCEUR CLEAR 05/30/2019 0220   LABSPEC 1.031 (H) 05/30/2019 0220   PHURINE 6.0 05/30/2019 0220   GLUCOSEU >=500 (A) 05/30/2019 0220   HGBUR NEGATIVE 05/30/2019 0220   BILIRUBINUR NEGATIVE 05/30/2019 0220   BILIRUBINUR negative 09/11/2016 1038   KETONESUR 20 (A) 05/30/2019 0220   PROTEINUR 100 (A) 05/30/2019 0220   UROBILINOGEN 0.2 09/11/2016 1038   UROBILINOGEN 0.2 10/28/2013 1723   NITRITE NEGATIVE 05/30/2019 0220   LEUKOCYTESUR NEGATIVE 05/30/2019 0220   Sepsis Labs: @LABRCNTIP (procalcitonin:4,lacticidven:4) )No results found for this or any previous visit (from the past 240 hour(s)).   Radiological Exams on Admission: No results found.  EKG: Independently reviewed. Sinus rhythm, rate 98, QTc 451.   Assessment/Plan   1. Uncontrolled IDDM, concern for mild DKA  - Presents with N/V and hyperglycemia, found to have serum glucose of 437 with low bicarbonate, elevated AG, and urine ketones, but no acidosis on blood gas  - She was given IVF, supplemental potassium, and started on insulin-infusion in ED  - Check beta-hydroxybutyrate, continue insulin infusion with frequent CBG's and serial chem panels    2. Nausea, vomiting; diabetic gastroparesis  - Presents with nausea and non-bloody vomiting without fever or abdominal pain  - She reports that the current sxs are similar to her prior experiences with gastroparesis   -  Exam is benign  - Continue Reglan, as-needed Zofran, glycemic-control   3. Depression  - Continue Zoloft   4. Hypertension  - BP at goal - Continue losartan and metoprolol as tolerated    5. Hypothyroidism  - Continue Synthroid     PPE: Mask, face shield. Patient wearing mask.  DVT prophylaxis: Lovenox  Code Status: Full  Family Communication: Discussed with patient  Consults called: None Admission status: Observation     [name], MD [facility] Pager [phone]  If 7PM-7AM, please contact night-coverage [email or web address] Password [email or web address]  05/30/2019, 4:30 AM

## 2019-05-30 NOTE — Plan of Care (Signed)
  Problem: Metabolic: Goal: Ability to maintain appropriate glucose levels will improve Outcome: Progressing   

## 2019-05-30 NOTE — Progress Notes (Signed)
RN directed by Dr. Tyrell Antonio not to initiated the D5 1/2NS but to contact MD when CBG less than 250.  Pt. CBG currently 209, RN text paged physician, awaiting response.  RN will continue to monitor.

## 2019-05-30 NOTE — ED Notes (Signed)
Bed: WA12 Expected date:  Expected time:  Means of arrival:  Comments: Neg pressure 

## 2019-05-30 NOTE — ED Notes (Signed)
ED TO INPATIENT HANDOFF REPORT  ED Nurse Name and Phone #: [name] Name/Age/Gender [name] [name] 58 y.o. female Room/Bed: WA10/WA10  Code Status   Code Status: Prior  Home/SNF/Other Home Patient oriented to: AOx4 Is this baseline? Yes   Triage Complete: Triage complete  Chief Complaint Hyperglycemic, nausea, vomiting  Triage Note [facility] transported pt from home and reports the following:.  Pt ate lunch at [facility] 05/29/19, felt ill around 1:00 PM. At 4:00 PM N/V/D started. Pt lost count of the number of times she vomited. At 1:00 AM 05/30/19 she checked her BG and 450. At 1:30 AM EMS checked BG 391. 12 Lead EKG unremarkable .     Allergies Allergies  Allergen Reactions  . Codeine Hives, Anxiety and Other (See Comments)    Level of Care/Admitting Diagnosis ED Disposition    ED Disposition Condition Comment   Admit  Hospital Area: [place] [[id number]]  Level of Care: Stepdown [14]  Admit to SDU based on following criteria: Severe physiological/psychological symptoms:  Any diagnosis requiring assessment & intervention at least every 4 hours on an ongoing basis to obtain desired patient outcomes including stability and rehabilitation  Covid Evaluation: Screening Protocol (No Symptoms)  Diagnosis: Uncontrolled diabetes mellitus with hyperosmolarity, with long-term current use of insulin [facility]) [[id number]]  Admitting Physician: [name] [[id number]]  Attending Physician: [name] [[id number]]  PT Class (Do Not Modify): Observation [104]  PT Acc Code (Do Not Modify): Observation [10022]       B Medical/Surgery History Past Medical History:  Diagnosis Date  . Allergy    seasonal  . Anxiety   . B12 deficiency   . Chest pain 04/05/2017  . CHF (congestive heart failure) ([place])   . Diabetes mellitus type 1, uncontrolled ([place])    "dr. just changed me to type 1, uncontrolled" (11/26/2018)  . DKA (diabetic ketoacidoses) ([place]) 05/25/2018  .  GERD (gastroesophageal reflux disease)   . Hyperlipidemia   . Hypertension    [name]  . Hypothyroidism   . IBS (irritable bowel syndrome)   . Intermittent vertigo   . 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 Leg cramping    "@ night" (09/18/2013)  . Migraine    "once q couple months" (11/26/2018)  . Osteoporosis   . Peripheral neuropathy   . Umbilical hernia    unrepaired (09/18/2013)   Past Surgical History:  Procedure Laterality Date  . CESAREAN SECTION  1989  . LAPAROSCOPIC ASSISTED VAGINAL HYSTERECTOMY  09/23/2012   Procedure: LAPAROSCOPIC ASSISTED VAGINAL HYSTERECTOMY;  Surgeon: Linda Hedges, DO;  Location: [facility];  Service: Gynecology;  Laterality: N/A;  pull Dr Gregor Hams instrument  . LEFT HEART CATH AND CORONARY ANGIOGRAPHY N/A 02/11/2017   Procedure: Left Heart Cath and Coronary Angiography;  Surgeon: Lorretta Harp, MD;  Location: [place] [facility];  Service: Cardiovascular;  Laterality: N/A;  . TUBAL LIGATION Bilateral 1992     A IV Location/Drains/Wounds Patient Lines/Drains/Airways Status   Active Line/Drains/Airways    Name:   Placement date:   Placement time:   Site:   Days:   Peripheral IV 05/30/19 Left Arm   05/30/19    0302    Arm   less than 1          Intake/Output Last 24 hours  Intake/Output Summary (Last 24 hours) at 05/30/2019 0525 Last data filed at 05/30/2019 0420 Gross per 24 hour  Intake 950 ml  Output -  Net 950 ml    Labs/Imaging Results for orders placed or performed during the hospital encounter of 05/30/19 (from the past 48 hour(s))  Basic metabolic panel     Status: Abnormal   Collection Time: 05/30/19  2:20 AM  Result Value Ref Range   Sodium 139 135 - 145 mmol/L   Potassium 3.5 3.5 - 5.1 mmol/L   Chloride 101 98 - 111 mmol/L   CO2 18 (L) 22 - 32 mmol/L   Glucose, Bld 437 (H) 70 - 99 mg/dL   BUN 14 6 - 20 mg/dL   Creatinine, Ser 0.66 0.44 - 1.00 mg/dL   Calcium 10.0 8.9 - 10.3 mg/dL   GFR calc non Af  Amer >60 >60 mL/min   GFR calc Af Amer >60 >60 mL/min   Anion gap 20 (H) 5 - 15    Comment: Performed at [facility], [place] [address], [place], [place] [address]  CBC     Status: Abnormal   Collection Time: 05/30/19  2:20 AM  Result Value Ref Range   WBC 18.5 (H) 4.0 - 10.5 K/uL   RBC 5.89 (H) 3.87 - 5.11 MIL/uL   Hemoglobin 16.9 (H) 12.0 - 15.0 g/dL   HCT 48.7 (H) 36.0 - 46.0 %   MCV 82.7 80.0 - 100.0 fL   MCH 28.7 26.0 - 34.0 pg   MCHC 34.7 30.0 - 36.0 g/dL   RDW 15.3 11.5 - 15.5 %   Platelets 430 (H) 150 - 400 K/uL   nRBC 0.0 0.0 - 0.2 %    Comment: Performed at [facility], [place] [address] [place], [place] [address]  Urinalysis, Routine w reflex microscopic     Status: Abnormal   Collection Time: 05/30/19  2:20 AM  Result Value Ref Range   Color, Urine YELLOW YELLOW   APPearance CLEAR CLEAR   Specific Gravity, Urine 1.031 (H) 1.005 - 1.030   pH 6.0 5.0 - 8.0   Glucose, UA >=500 (A) NEGATIVE mg/dL   Hgb urine dipstick NEGATIVE NEGATIVE   Bilirubin Urine NEGATIVE NEGATIVE   Ketones, ur 20 (A) NEGATIVE mg/dL   Protein, ur 100 (A) NEGATIVE mg/dL   Nitrite NEGATIVE NEGATIVE   Leukocytes,Ua NEGATIVE NEGATIVE   RBC / HPF 0-5 0 - 5 RBC/hpf   WBC, UA 0-5 0 - 5 WBC/hpf   Bacteria, UA RARE (A) NONE SEEN   Squamous Epithelial / LPF 0-5 0 - 5    Comment: Performed at [facility], [place] [address] [place], [place] [address]  CBG monitoring, ED     Status: Abnormal   Collection Time: 05/30/19  2:33 AM  Result Value Ref Range   Glucose-Capillary 457 (H) 70 - 99 mg/dL  I-Stat beta hCG blood, ED     Status: None   Collection Time: 05/30/19  3:22 AM  Result Value Ref Range   I-stat hCG, quantitative <5.0 <5 mIU/mL   Comment 3            Comment:   GEST. AGE      CONC.  (mIU/mL)   <=1 WEEK        5 - 50     2 WEEKS       50 - 500     3 WEEKS       100 - 10,000     4 WEEKS     1,000 - 30,000        FEMALE AND NON-PREGNANT  FEMALE:  LESS THAN 5 mIU/mL   POCT I-Stat EG7     Status: Abnormal   Collection Time: 05/30/19  3:24 AM  Result Value Ref Range   pH, Ven 7.492 (H) 7.250 - 7.430   pCO2, Ven 28.5 (L) 44.0 - 60.0 mmHg   pO2, Ven 61.0 (H) 32.0 - 45.0 mmHg   Bicarbonate 21.9 20.0 - 28.0 mmol/L   TCO2 23 22 - 32 mmol/L   O2 Saturation 93.0 %   Sodium 137 135 - 145 mmol/L   Potassium 3.5 3.5 - 5.1 mmol/L   Calcium, Ion 1.12 (L) 1.15 - 1.40 mmol/L   HCT 50.0 (H) 36.0 - 46.0 %   Hemoglobin 17.0 (H) 12.0 - 15.0 g/dL   Patient temperature HIDE    Sample type VENOUS   CBG monitoring, ED     Status: Abnormal   Collection Time: 05/30/19  4:21 AM  Result Value Ref Range   Glucose-Capillary 335 (H) 70 - 99 mg/dL  CBG monitoring, ED     Status: Abnormal   Collection Time: 05/30/19  5:06 AM  Result Value Ref Range   Glucose-Capillary 344 (H) 70 - 99 mg/dL   No results found.  Pending Labs [employer] (From admission, onward)    Start     Ordered   05/30/19 0455  SARS Coronavirus 2 (CEPHEID - Performed in [place] hospital lab), [facility] Order  Once,   STAT    Question:  Rule Out  Answer:  Yes   05/30/19 0454   Signed and Held  Creatinine, serum  (enoxaparin (LOVENOX)    CrCl >/= 30 ml/min)  Weekly,   R    Comments: while on enoxaparin therapy    Signed and Held   Signed and Held  Beta-hydroxybutyric acid  Once,   R     Signed and Held   Signed and Held  Basic metabolic panel  Now then every 4 hours,   R     Signed and Held   Signed and Held  CBC with Differential/Platelet  Tomorrow morning,   R     Signed and Held          Vitals/Pain Today's Vitals   05/30/19 0330 05/30/19 0357 05/30/19 0400 05/30/19 0415  BP: (!) 144/96  104/62 (!) 151/100  Pulse: 85  82 100  Resp:    10  Temp:      SpO2: 99%  96% 99%  Weight:      Height:      PainSc:  0-No pain      Isolation Precautions No active isolations  Medications Medications  dextrose 5 %-0.45 % sodium chloride infusion (has no  administration in time range)  insulin regular bolus via infusion 0-10 Units (has no administration in time range)  insulin regular, human (MYXREDLIN) 100 units/ 100 mL infusion (has no administration in time range)  dextrose 50 % solution 25 mL (has no administration in time range)  0.9 %  sodium chloride infusion ( Intravenous New Bag/Given 05/30/19 0429)  potassium chloride SA (K-DUR) CR tablet 40 mEq (40 mEq Oral Refused 05/30/19 0439)  potassium chloride 10 mEq in 100 mL IVPB (10 mEq Intravenous New Bag/Given 05/30/19 0430)  lactated ringers bolus 1,000 mL (0 mLs Intravenous Stopped 05/30/19 0420)  promethazine (PHENERGAN) injection 25 mg (25 mg Intravenous Given 05/30/19 0313)    Mobility walks Low fall risk   Focused Assessments NA   R Recommendations: See Admitting Provider Note  Report given to:  Additional Notes: NA

## 2019-05-30 NOTE — ED Notes (Signed)
Attempted to call report. Person who answered said Judson Roch will call me back.

## 2019-05-30 NOTE — ED Notes (Signed)
Attempted to call report [phone]. No answer.

## 2019-05-31 ENCOUNTER — Observation Stay (HOSPITAL_COMMUNITY): Payer: 59

## 2019-05-31 ENCOUNTER — Encounter (HOSPITAL_COMMUNITY): Payer: Self-pay

## 2019-05-31 DIAGNOSIS — Z79890 Hormone replacement therapy (postmenopausal): Secondary | ICD-10-CM | POA: Diagnosis not present

## 2019-05-31 DIAGNOSIS — E1110 Type 2 diabetes mellitus with ketoacidosis without coma: Secondary | ICD-10-CM | POA: Diagnosis not present

## 2019-05-31 DIAGNOSIS — N182 Chronic kidney disease, stage 2 (mild): Secondary | ICD-10-CM | POA: Diagnosis present

## 2019-05-31 DIAGNOSIS — R933 Abnormal findings on diagnostic imaging of other parts of digestive tract: Secondary | ICD-10-CM

## 2019-05-31 DIAGNOSIS — Z87891 Personal history of nicotine dependence: Secondary | ICD-10-CM | POA: Diagnosis not present

## 2019-05-31 DIAGNOSIS — E1043 Type 1 diabetes mellitus with diabetic autonomic (poly)neuropathy: Secondary | ICD-10-CM | POA: Diagnosis present

## 2019-05-31 DIAGNOSIS — E039 Hypothyroidism, unspecified: Secondary | ICD-10-CM | POA: Diagnosis present

## 2019-05-31 DIAGNOSIS — Z7982 Long term (current) use of aspirin: Secondary | ICD-10-CM | POA: Diagnosis not present

## 2019-05-31 DIAGNOSIS — E861 Hypovolemia: Secondary | ICD-10-CM | POA: Diagnosis present

## 2019-05-31 DIAGNOSIS — Z90710 Acquired absence of both cervix and uterus: Secondary | ICD-10-CM | POA: Diagnosis not present

## 2019-05-31 DIAGNOSIS — F329 Major depressive disorder, single episode, unspecified: Secondary | ICD-10-CM | POA: Diagnosis present

## 2019-05-31 DIAGNOSIS — E1010 Type 1 diabetes mellitus with ketoacidosis without coma: Secondary | ICD-10-CM | POA: Diagnosis present

## 2019-05-31 DIAGNOSIS — Z794 Long term (current) use of insulin: Secondary | ICD-10-CM | POA: Diagnosis not present

## 2019-05-31 DIAGNOSIS — K828 Other specified diseases of gallbladder: Secondary | ICD-10-CM | POA: Diagnosis present

## 2019-05-31 DIAGNOSIS — I509 Heart failure, unspecified: Secondary | ICD-10-CM | POA: Diagnosis present

## 2019-05-31 DIAGNOSIS — Z1159 Encounter for screening for other viral diseases: Secondary | ICD-10-CM | POA: Diagnosis not present

## 2019-05-31 DIAGNOSIS — E1022 Type 1 diabetes mellitus with diabetic chronic kidney disease: Secondary | ICD-10-CM | POA: Diagnosis present

## 2019-05-31 DIAGNOSIS — K208 Other esophagitis: Secondary | ICD-10-CM | POA: Diagnosis present

## 2019-05-31 DIAGNOSIS — I130 Hypertensive heart and chronic kidney disease with heart failure and stage 1 through stage 4 chronic kidney disease, or unspecified chronic kidney disease: Secondary | ICD-10-CM | POA: Diagnosis present

## 2019-05-31 DIAGNOSIS — E876 Hypokalemia: Secondary | ICD-10-CM | POA: Diagnosis present

## 2019-05-31 DIAGNOSIS — K529 Noninfective gastroenteritis and colitis, unspecified: Secondary | ICD-10-CM | POA: Diagnosis not present

## 2019-05-31 DIAGNOSIS — R112 Nausea with vomiting, unspecified: Secondary | ICD-10-CM | POA: Diagnosis not present

## 2019-05-31 DIAGNOSIS — K219 Gastro-esophageal reflux disease without esophagitis: Secondary | ICD-10-CM | POA: Diagnosis present

## 2019-05-31 DIAGNOSIS — E785 Hyperlipidemia, unspecified: Secondary | ICD-10-CM | POA: Diagnosis present

## 2019-05-31 DIAGNOSIS — K3184 Gastroparesis: Secondary | ICD-10-CM | POA: Diagnosis present

## 2019-05-31 DIAGNOSIS — F411 Generalized anxiety disorder: Secondary | ICD-10-CM | POA: Diagnosis present

## 2019-05-31 DIAGNOSIS — Z79899 Other long term (current) drug therapy: Secondary | ICD-10-CM | POA: Diagnosis not present

## 2019-05-31 DIAGNOSIS — E1165 Type 2 diabetes mellitus with hyperglycemia: Secondary | ICD-10-CM | POA: Diagnosis present

## 2019-05-31 DIAGNOSIS — E1143 Type 2 diabetes mellitus with diabetic autonomic (poly)neuropathy: Secondary | ICD-10-CM | POA: Diagnosis not present

## 2019-05-31 DIAGNOSIS — R1084 Generalized abdominal pain: Secondary | ICD-10-CM | POA: Diagnosis not present

## 2019-05-31 DIAGNOSIS — Z833 Family history of diabetes mellitus: Secondary | ICD-10-CM | POA: Diagnosis not present

## 2019-05-31 LAB — CBC WITH DIFFERENTIAL/PLATELET
Abs Immature Granulocytes: 0.16 10*3/uL — ABNORMAL HIGH (ref 0.00–0.07)
Basophils Absolute: 0 10*3/uL (ref 0.0–0.1)
Basophils Relative: 0 %
Eosinophils Absolute: 0 10*3/uL (ref 0.0–0.5)
Eosinophils Relative: 0 %
HCT: 47.3 % — ABNORMAL HIGH (ref 36.0–46.0)
Hemoglobin: 15.3 g/dL — ABNORMAL HIGH (ref 12.0–15.0)
Immature Granulocytes: 1 %
Lymphocytes Relative: 12 %
Lymphs Abs: 2.2 10*3/uL (ref 0.7–4.0)
MCH: 27.4 pg (ref 26.0–34.0)
MCHC: 32.3 g/dL (ref 30.0–36.0)
MCV: 84.8 fL (ref 80.0–100.0)
Monocytes Absolute: 1.1 10*3/uL — ABNORMAL HIGH (ref 0.1–1.0)
Monocytes Relative: 6 %
Neutro Abs: 15.6 10*3/uL — ABNORMAL HIGH (ref 1.7–7.7)
Neutrophils Relative %: 81 %
Platelets: 344 10*3/uL (ref 150–400)
RBC: 5.58 MIL/uL — ABNORMAL HIGH (ref 3.87–5.11)
RDW: 15.3 % (ref 11.5–15.5)
WBC: 19.1 10*3/uL — ABNORMAL HIGH (ref 4.0–10.5)
nRBC: 0 % (ref 0.0–0.2)

## 2019-05-31 LAB — BASIC METABOLIC PANEL
Anion gap: 15 (ref 5–15)
BUN: 7 mg/dL (ref 6–20)
CO2: 23 mmol/L (ref 22–32)
Calcium: 8.5 mg/dL — ABNORMAL LOW (ref 8.9–10.3)
Chloride: 98 mmol/L (ref 98–111)
Creatinine, Ser: 0.51 mg/dL (ref 0.44–1.00)
GFR calc Af Amer: >60 mL/min (ref >60)
GFR calc non Af Amer: >60 mL/min (ref >60)
Glucose, Bld: 241 mg/dL — ABNORMAL HIGH (ref 70–99)
Potassium: 3.5 mmol/L (ref 3.5–5.1)
Sodium: 136 mmol/L (ref 135–145)

## 2019-05-31 LAB — GLUCOSE, CAPILLARY
Glucose-Capillary: 237 mg/dL — ABNORMAL HIGH (ref 70–99)
Glucose-Capillary: 152 mg/dL — ABNORMAL HIGH (ref 70–99)
Glucose-Capillary: 124 mg/dL — ABNORMAL HIGH (ref 70–99)
Glucose-Capillary: 92 mg/dL (ref 70–99)

## 2019-05-31 LAB — HEPATIC FUNCTION PANEL
ALT: 12 U/L (ref 0–44)
AST: 12 U/L — ABNORMAL LOW (ref 15–41)
Albumin: 3.8 g/dL (ref 3.5–5.0)
Alkaline Phosphatase: 80 U/L (ref 38–126)
Bilirubin, Direct: 0.1 mg/dL (ref 0.0–0.2)
Indirect Bilirubin: 0.5 mg/dL (ref 0.3–0.9)
Total Bilirubin: 0.6 mg/dL (ref 0.3–1.2)
Total Protein: 7 g/dL (ref 6.5–8.1)

## 2019-05-31 LAB — LIPASE, BLOOD: Lipase: 40 U/L (ref 11–51)

## 2019-05-31 LAB — LACTIC ACID, PLASMA
Lactic Acid, Venous: 0.8 mmol/L (ref 0.5–1.9)
Lactic Acid, Venous: 1.1 mmol/L (ref 0.5–1.9)

## 2019-05-31 MED ORDER — SODIUM CHLORIDE (PF) 0.9 % IJ SOLN
INTRAMUSCULAR | Status: AC
  Filled 2019-05-31: qty 50

## 2019-05-31 MED ORDER — SODIUM CHLORIDE 0.9 % IV BOLUS
500.0000 mL | Freq: Once | INTRAVENOUS | Status: AC
  Administered 2019-05-31: 500 mL via INTRAVENOUS

## 2019-05-31 MED ORDER — INSULIN GLARGINE 100 UNIT/ML ~~LOC~~ SOLN
15.0000 [IU] | Freq: Every day | SUBCUTANEOUS | Status: DC
  Administered 2019-05-31 – 2019-06-03 (×4): 15 [IU] via SUBCUTANEOUS
  Filled 2019-05-31 (×4): qty 0.15

## 2019-05-31 MED ORDER — MORPHINE SULFATE (PF) 2 MG/ML IV SOLN
0.5000 mg | INTRAVENOUS | Status: DC | PRN
  Administered 2019-05-31: 0.5 mg via INTRAVENOUS
  Filled 2019-05-31: qty 1

## 2019-05-31 MED ORDER — MORPHINE SULFATE (PF) 2 MG/ML IV SOLN
INTRAVENOUS | Status: AC
  Filled 2019-05-31: qty 1

## 2019-05-31 MED ORDER — SODIUM CHLORIDE 0.9 % IV SOLN
3.0000 g | Freq: Four times a day (QID) | INTRAVENOUS | Status: DC
  Administered 2019-05-31 – 2019-06-03 (×13): 3 g via INTRAVENOUS
  Filled 2019-05-31 (×18): qty 3

## 2019-05-31 MED ORDER — PANTOPRAZOLE SODIUM 40 MG IV SOLR
40.0000 mg | Freq: Two times a day (BID) | INTRAVENOUS | Status: DC
  Administered 2019-05-31 – 2019-06-03 (×7): 40 mg via INTRAVENOUS
  Filled 2019-05-31 (×7): qty 40

## 2019-05-31 MED ORDER — IOHEXOL 300 MG/ML  SOLN
100.0000 mL | Freq: Once | INTRAMUSCULAR | Status: AC | PRN
  Administered 2019-05-31: 100 mL via INTRAVENOUS

## 2019-05-31 MED ORDER — MORPHINE SULFATE (PF) 2 MG/ML IV SOLN
1.0000 mg | INTRAVENOUS | Status: DC | PRN
  Administered 2019-05-31 (×2): 2 mg via INTRAVENOUS
  Filled 2019-05-31: qty 1

## 2019-05-31 NOTE — Consult Note (Addendum)
Referring Provider: Dr. Niel Hummer Primary Care Physician:  Jinny Sanders, MD Primary Gastroenterologist:  Dr. Owens Loffler   Reason for Consultation: Abdominal pain, N/V  HPI: [name] [name] is a 58 y.o. female with a past medical history significant for CHF, poorly controlled diabetes mellitus type 1, multiple hospital admissions with DKA, gastroparesis, Shatzki's ring, hypertension, hyperlipidemia, hypothyroidism, chronic kidney disease stage II, leukocytosis and peripheral neuropathy. Past C section and vaginal hysterectomy. She underwent a tele visit with  Dr. Ardis Hughs on 5/202/2020 for follow up chronic N/V,  He did not think she had gastroparesis. Gastric empty scan 03/2017 was normal. She takes Reglan 5mg  QID. She smokes marijuana 2 joints daily. She was advised by Dr. Ardis Hughs to stop smoking marijuana which can cause cyclic N/V. She has not smoked any marijuana since Monday 05/25/2019. She underwent a screening colonoscopy on 05/25/2019 which identified diverticulosis in the entire examined colon, no polyps. No post colonoscopy abdominal pain or diarrhea. She underwent an EGD  February 2018 identified a Schatzki's ring which was dilated up to 20 mm. She developed nausea on Friday 05/29/2019 after eating a chicken sandwich. She vomited bilious secretions at 4pm. No abdominal pain or fever. She continued to vomit later that evening and throughout the night. She could not keep any fluid or liquids down. No BM since Friday, normal stool. She is passing gas per the rectum. Denies alcohol use. She presented to the ED yesterday for further evaluation.  ED course: Labs 05/30/2019: sodium 139.  Potassium 3.5.  Glucose 437.  BUN 14.  Creatinine 0.66.  Calcium 10.0.  Anion gap 20.  WBC 18.5.  Hemoglobin 16.9.  Hematocrit 48.7.   MCV 82.7.  Platelet 430.  Labs 05/31/2019: Sodium 136.  Potassium 3.5.  Glucose 241.  BUN 7.  Creatinine 0.51.  Anion gap 15.  WBC 19.1.  Hemoglobin 15.3.  Hematocrit 47.3.   Platelet 344.  ANC 15.6. SARS coronavirus Negative.  Abdominal/pelvic CT: 1. New wall thickening and mucosal hyperenhancement throughout the visualized lower esophagus, stomach, duodenum and proximal jejunum, most compatible with a nonspecific infectious or inflammatory esophago-gastro-enteritis. 2. Focal adenomyomatosis of the gallbladder fundus, chronic. 3. Mild colonic diverticulosis. No evidence of acute diverticulitis. 4.  Aortic Atherosclerosis   Past Medical History:  Diagnosis Date  . Allergy    seasonal  . Anxiety   . B12 deficiency   . Chest pain 04/05/2017  . CHF (congestive heart failure) ([place])   . Diabetes mellitus type 1, uncontrolled ([place])    "dr. just changed me to type 1, uncontrolled" (11/26/2018)  . DKA (diabetic ketoacidoses) ([place]) 05/25/2018  . GERD (gastroesophageal reflux disease)   . Hyperlipidemia   . Hypertension    [name]  . Hypothyroidism   . IBS (irritable bowel syndrome)   . Intermittent vertigo   . 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 Leg cramping    "@ night" (09/18/2013)  . Migraine    "once q couple months" (11/26/2018)  . Osteoporosis   . Peripheral neuropathy   . Umbilical hernia    unrepaired (09/18/2013)    Past Surgical History:  Procedure Laterality Date  . CESAREAN SECTION  1989  . LAPAROSCOPIC ASSISTED VAGINAL HYSTERECTOMY  09/23/2012   Procedure: LAPAROSCOPIC ASSISTED VAGINAL HYSTERECTOMY;  Surgeon: Linda Hedges, DO;  Location: [facility];  Service: Gynecology;  Laterality: N/A;  pull Dr Gregor Hams instrument  . LEFT HEART CATH AND CORONARY ANGIOGRAPHY N/A 02/11/2017   Procedure: Left Heart Cath and  Coronary Angiography;  Surgeon: Lorretta Harp, MD;  Location: [facility];  Service: Cardiovascular;  Laterality: N/A;  . TUBAL LIGATION Bilateral 1992    Prior to Admission medications   Medication Sig Start Date End Date Taking? Authorizing Provider  acetaminophen (TYLENOL) 325 MG tablet Take 650  mg by mouth every 6 (six) hours as needed for mild pain, fever or headache.   Yes [provider]  aspirin 81 MG chewable tablet Chew 1 tablet (81 mg total) by mouth daily. 02/12/17  Yes Jani Gravel, MD  fexofenadine (ALLEGRA) 180 MG tablet Take 1 tablet (180 mg total) by mouth daily. 04/02/19  Yes Bedsole, Amy E, MD  furosemide (LASIX) 20 MG tablet Take 1 tablet (20 mg total) by mouth as needed for fluid. 04/29/19  Yes Minus Breeding, MD  insulin lispro (HUMALOG KWIKPEN) 100 UNIT/ML KiwkPen Inject into skin 3 times a day at meal time according to the following sliding scale: CBG 70-120 - 0 units / CBG 121-150 - 1 unit / CBG 151-200 - 2 units / CBG 201-250 - 3 units / CBG 251-300 - 5 units / CBG 301-350 - 7 units / CBG 351-400+ - 9 units  YOU MUST FOLLOW A STRICT DIABETIC DIET Patient taking differently: Inject into the skin See admin instructions. 8 units plus sliding scale Inject into skin 3 times a day at meal time according to the following sliding scale: CBG 70-120 - 0 units / CBG 121-150 - 1 unit / CBG 151-200 - 2 units / CBG 201-250 - 3 units / CBG 251-300 - 5 units / CBG 301-350 - 7 units / CBG 351-400+ - 9 units  YOU MUST FOLLOW A STRICT DIABETIC DIET 07/01/18  Yes Bedsole, Amy E, MD  levothyroxine (SYNTHROID, LEVOTHROID) 150 MCG tablet TAKE 1 TABLET BY MOUTH EVERY DAY Patient taking differently: Take 150 mcg by mouth daily before breakfast.  11/10/18  Yes Bedsole, Amy E, MD  losartan (COZAAR) 25 MG tablet TAKE 1 TABLET BY MOUTH EVERY DAY Patient taking differently: Take 25 mg by mouth daily.  03/17/19  Yes Barrett, Evelene Croon, PA-C  metoCLOPramide (REGLAN) 5 MG tablet Take 5mg  before breakfast lunch and dinner and 10mg  at bedtime 04/29/19  Yes Milus Banister, MD  metoprolol succinate (TOPROL-XL) 25 MG 24 hr tablet TAKE 1 TABLET BY MOUTH EVERY DAY Patient taking differently: Take 25 mg by mouth daily.  10/06/18  Yes Minus Breeding, MD  nitroGLYCERIN (NITRODUR - DOSED IN  MG/24 HR) 0.2 mg/hr patch APPLY 1 PATCH TO SKIN EVERY DAY AS NEEDED Patient taking differently: Place 0.2 mg onto the skin daily.  08/27/18  Yes Barrett, Evelene Croon, PA-C  pantoprazole (PROTONIX) 40 MG tablet Take 1 tablet (40 mg total) by mouth daily at 12 noon. 01/28/18  Yes Bedsole, Amy E, MD  promethazine (PHENERGAN) 25 MG tablet TAKE 1 TABLET BY MOUTH EVERY 8 HOURS AS NEEDED FOR NAUSEA OR VOMITING. Patient taking differently: Take 25 mg by mouth every 8 (eight) hours as needed for vomiting.  04/01/19  Yes Bedsole, Amy E, MD  rosuvastatin (CRESTOR) 40 MG tablet Take 1 tablet (40 mg total) by mouth daily. 04/10/19 07/09/19 Yes Hochrein, Jeneen Rinks, MD  sertraline (ZOLOFT) 50 MG tablet TAKE 1 TABLET BY MOUTH EVERY DAY Patient taking differently: Take 100 mg by mouth daily.  12/19/18  Yes Bedsole, Amy E, MD  TRESIBA FLEXTOUCH 100 UNIT/ML SOPN FlexTouch Pen Inject 0.24 mLs (24 Units total) into the skin at bedtime. On 10/20/18  take at lunch time, on 10/21/18 take at dinner time. On 10/22/18 resume at bedtime regimen. 10/20/18  Yes Lavina Hamman, MD  Continuous Blood Gluc Sensor (FREESTYLE LIBRE 14 DAY SENSOR) MISC by Does not apply route. 04/01/19   [provider]  Insulin Pen Needle (PEN NEEDLES) 31G X 8 MM MISC Use to inject insulin 4 times a day.  Dx: E11.10 12/23/17   Jinny Sanders, MD  Lancets (ONETOUCH ULTRASOFT) lancets Use to check blood sugar daily as needed.  Dx: E11.10 12/23/17   Jinny Sanders, MD  ONETOUCH VERIO test strip USE TO CHECK BLOOD SUGAR DAILY AS NEEDED. DX: E11.10 09/21/18   Jinny Sanders, MD    Current Facility-Administered Medications  Medication Dose Route Frequency Provider Last Rate Last Dose  . 0.9 %  sodium chloride infusion   Intravenous Continuous Regalado, Belkys A, MD 100 mL/hr at 05/31/19 0841    . Ampicillin-Sulbactam (UNASYN) 3 g in sodium chloride 0.9 % 100 mL IVPB  3 g Intravenous Q6H Thomes Lolling, RPH      . aspirin chewable tablet 81 mg  81 mg Oral  Daily Opyd, Ilene Qua, MD      . Chlorhexidine Gluconate Cloth 2 % PADS 6 each  6 each Topical Daily Regalado, Belkys A, MD   6 each at 05/30/19 0934  . dextrose 50 % solution 25 mL  25 mL Intravenous PRN Opyd, Ilene Qua, MD      . enoxaparin (LOVENOX) injection 40 mg  40 mg Subcutaneous Q24H Opyd, Ilene Qua, MD   40 mg at 05/30/19 0921  . hydrALAZINE (APRESOLINE) injection 10 mg  10 mg Intravenous Q6H PRN Vertis Kelch, NP   10 mg at 05/30/19 0656  . insulin aspart (novoLOG) injection 0-5 Units  0-5 Units Subcutaneous QHS Regalado, Belkys A, MD      . insulin aspart (novoLOG) injection 0-9 Units  0-9 Units Subcutaneous TID WC Regalado, Belkys A, MD   3 Units at 05/31/19 0807  . insulin glargine (LANTUS) injection 15 Units  15 Units Subcutaneous Daily Regalado, Belkys A, MD      . levothyroxine (SYNTHROID) tablet 150 mcg  150 mcg Oral QAC breakfast Opyd, Ilene Qua, MD   150 mcg at 05/30/19 0605  . losartan (COZAAR) tablet 25 mg  25 mg Oral Daily Opyd, Ilene Qua, MD      . metoCLOPramide (REGLAN) injection 10 mg  10 mg Intravenous Q8H Opyd, Ilene Qua, MD   10 mg at 05/31/19 0518  . metoprolol succinate (TOPROL-XL) 24 hr tablet 25 mg  25 mg Oral Daily Opyd, Ilene Qua, MD      . morphine 2 MG/ML injection 1-2 mg  1-2 mg Intravenous Q4H PRN Regalado, Belkys A, MD   2 mg at 05/31/19 0806  . ondansetron (ZOFRAN) injection 4 mg  4 mg Intravenous Q6H PRN Opyd, Ilene Qua, MD   4 mg at 05/31/19 0221  . pantoprazole (PROTONIX) injection 40 mg  40 mg Intravenous Q12H Regalado, Belkys A, MD      . rosuvastatin (CRESTOR) tablet 40 mg  40 mg Oral q1800 Opyd, Ilene Qua, MD      . sertraline (ZOLOFT) tablet 100 mg  100 mg Oral Daily Opyd, Ilene Qua, MD      . sodium chloride (PF) 0.9 % injection             Allergies as of 05/30/2019 - Review Complete 05/30/2019  Allergen Reaction Noted  .  Codeine Hives, Anxiety, and Other (See Comments)     Family History  Problem Relation Age of Onset  .  Diabetes Other   . Heart disease Other   . Heart failure Mother 12       Her heart skipped a beat and stopped - per pt  . Diabetes Maternal Grandmother   . Alzheimer's disease Maternal Grandmother   . Coronary artery disease Maternal Grandmother   . Heart attack Maternal Grandfather   . Heart failure Brother   . Diabetes Brother   . Colon polyps Neg Hx   . Esophageal cancer Neg Hx   . Liver cancer Neg Hx   . Pancreatic cancer Neg Hx   . Stomach cancer Neg Hx   . Crohn's disease Neg Hx   . Rectal cancer Neg Hx     Social History   Socioeconomic History  . Marital status: Married    Spouse name: Not on file  . Number of children: 2  . Years of education: Not on file  . Highest education level: Not on file  Occupational History  . Occupation: UNEMPLOYED    Fish farm manager: UNEMPLOYED    Comment: homemaker  Social Needs  . Financial resource strain: Not on file  . Food insecurity    Worry: Not on file    Inability: Not on file  . Transportation needs    Medical: Not on file    Non-medical: Not on file  Tobacco Use  . Smoking status: Former Smoker    Packs/day: 0.12    Years: 10.00    Pack years: 1.20    Types: Cigarettes    Quit date: 12/10/1990    Years since quitting: 28.4  . Smokeless tobacco: Never Used  Substance and Sexual Activity  . Alcohol use: Never    Alcohol/week: 0.0 standard drinks    Frequency: Never  . Drug use: Yes    Types: Marijuana    Comment: last use 2 days ago  . Sexual activity: Not Currently  Lifestyle  . Physical activity    Days per week: Not on file    Minutes per session: Not on file  . Stress: Not on file  Relationships  . Social Herbalist on phone: Not on file    Gets together: Not on file    Attends religious service: Not on file    Active member of club or organization: Not on file    Attends meetings of clubs or organizations: Not on file    Relationship status: Not on file  . Intimate partner violence    Fear of  current or ex partner: Not on file    Emotionally abused: Not on file    Physically abused: Not on file    Forced sexual activity: Not on file  Other Topics Concern  . Not on file  Social History Narrative   Regular exercise- no    Diet- lots of mountain dew, limits fast foods     Review of Systems: Gen: Denies fever, sweats or chills. No weight loss.  CV: Denies chest pain, palpitations or edema. Resp: Denies cough, shortness of breath of hemoptysis.  GI: See HPI. GU : Denies urinary burning, blood in urine, increased urinary frequency or incontinence. MS: Denies joint pain, muscles aches or weakness. Derm: Denies rash, itchiness, skin lesions or unhealing ulcers. Psych: Denies depression, anxiety, memory loss, suicidal ideation and confusion. Heme: Denies bruising, bleeding. Neuro:  Denies headaches, dizziness or paresthesias. Endo:  Type I DM.  Physical Exam: Vital signs in last 24 hours: Temp:  [98 F (36.7 C)-98.6 F (37 C)] 98 F (36.7 C) (06/21 0800) Pulse Rate:  [93-143] 100 (06/21 0500) Resp:  [11-37] 21 (06/21 0500) BP: (110-176)/(64-124) 158/88 (06/21 0500) SpO2:  [94 %-99 %] 95 % (06/21 0500) Last BM Date: 05/29/19 General:   Alert, fatigued appearing female in NAD. Head:  Normocephalic and atraumatic. Eyes:  Sclera clear, no icterus. Conjunctiva pink. Ears:  Normal auditory acuity. Nose:  No deformity, discharge or lesions. Mouth:  No deformity or lesions.  Poor dentition. Neck:  Supple. Lungs:  Clear throughout all lung fields. Heart:  RRR, no murmur. Abdomen: Distended, soft, RLQ tenderness without rebound or guarding, hypoactive bowel sounds x 4 quads, no organomegaly.  Rectal:  Deferred  Msk:  Symmetrical without gross deformities. . Pulses:  Normal pulses noted. Extremities:  Without clubbing or edema. Neurologic:  Alert and  oriented x3;  grossly normal neurologically. Skin:  Intact without significant lesions or rashes.. Psych:  Alert and  cooperative. Normal mood and affect.  Intake/Output from previous day: 06/20 0701 - 06/21 0700 In: 3853.9 [I.V.:1837.5; IV Piggyback:2016.3] Out: 2350 [Urine:2350] Intake/Output this shift: Total I/O In: 975.9 [I.V.:421.5; IV Piggyback:554.4] Out: -   Lab Results: Recent Labs    05/30/19 0220 05/30/19 0324 05/31/19 0217  WBC 18.5*  --  19.1*  HGB 16.9* 17.0* 15.3*  HCT 48.7* 50.0* 47.3*  PLT 430*  --  344   BMET Recent Labs    05/30/19 0751 05/30/19 1129 05/31/19 0217  NA 136 140 136  K 3.3* 3.0* 3.5  CL 101 103 98  CO2 23 24 23   GLUCOSE 341* 166* 241*  BUN 12 11 7   CREATININE 0.60 0.50 0.51  CALCIUM 8.9 8.7* 8.5*     Studies/Results: Dg Abd 1 View  Result Date: 05/30/2019 CLINICAL DATA:  Vomiting. EXAM: ABDOMEN - 1 VIEW COMPARISON:  03/25/2019 FINDINGS: The bowel gas pattern is normal. No radio-opaque calculi or other significant radiographic abnormality are seen. IMPRESSION: Negative. Electronically Signed   By: Fidela Salisbury M.D.   On: 05/30/2019 17:28   Dg Chest Port 1 View  Result Date: 05/30/2019 CLINICAL DATA:  Leukocytosis EXAM: PORTABLE CHEST 1 VIEW COMPARISON:  03/25/2019 chest radiograph. FINDINGS: Stable cardiomediastinal silhouette with normal heart size. No pneumothorax. No pleural effusion. Lungs appear clear, with no acute consolidative airspace disease and no pulmonary edema. IMPRESSION: No active disease. Electronically Signed   By: Ilona Sorrel M.D.   On: 05/30/2019 09:31    IMPRESSION/PLAN:  1. 58 y.o. female with chronic leukocytosis of unclear etiology, DM Type I with questionable gastroparesis presented to the ED with  N/V and DKA. N/V possibly triggered by abrupt cessation of Marijuana 05/25/2019. DK resolving but patient developed new abdominal pain today. N/V improving  -Reglan, Zofran PRN -IVF per hospitalist -glucose monitoring/insulin per hospitalist  -Protonix 40mg  IV bid  2. Generalized abdominal pain started today, now more  localized to the RLQ. CTAP showed new wall thickening and mucosal hyperenhancement throughout the visualized lower esophagus, stomach, duodenum and proximal jejunum, most compatible with a nonspecific infectious or inflammatory esophago-gastro-enteritis. No diarrhea. WBC 19.1. (baseline WBC 16). ANC 15.6.  -continue to monitor temperature, CBC and pain level -continue Unasyn 3gm IV Q 6hrs for now -KUB in am -clear liquids only -pain management per hospitalist  3. Diverticulosis. No evidence of diverticulitis per CTAP 05/31/2019.  4. CHF stable  5. Focal adenomyomatosis of the gallbladder fundus,  chronic -consider abdominal sonogram as outpatient    Further recommendations per Dr. Nehemiah Settle Dorathy Daft NP 05/31/2019, 9:07 AM    Attending Physician Note   I have taken a history, examined the patient and reviewed the chart. I agree with the Advanced Practitioner's note, impression and recommendations.   Acute N/V in setting of DKA with ongoing marijuana use. No vomiting today however nausea persists. Gastroparesis was not confirmed on [facility] in 2018.  Abnl thickening of lower esophagus, stomach, duodenum, proximal jejunum on CT c/w infectious or inflammatory process   Continue mgmt of DM, DKA Continue IV Unasyn, IV Reglan, IV Protonix, IV Zofran for now EGD in next couple days to further evaluate CT abnormalities Dr. Lyndel [name] is covering [facility] service starting on Monday   Lucio Edward, MD [facility] [phone]

## 2019-05-31 NOTE — Progress Notes (Signed)
Pharmacy Antibiotic Note  GERTIE BROERMAN is a 58 y.o. female admitted on 05/30/2019 with hyperglycemia, leukocytosis, nausea/vomiting.  PMH significant for DM with diet non-compliance, gastroparesis.  Concern for intra-abdominal infection.  Pharmacy has been consulted for Unasyn dosing. 05/31/2019:  Afebrile  WBC/ANC elevated, LA WNL  Scr at baseline, CrCl>70ml/min  No cx data  GI consult pending  Plan: Unasyn 3gm IV q6h No dose adjustments anticipated, pharmacy to sign off.  Please re-consult if needed.   Height: 4\' 11"  (149.9 cm) Weight: 136 lb 0.4 oz (61.7 kg) IBW/kg (Calculated) : 43.2  Temp (24hrs), Avg:98.3 F (36.8 C), Min:98 F (36.7 C), Max:98.6 F (37 C)  Recent Labs  Lab 05/30/19 0220 05/30/19 0751 05/30/19 1129 05/31/19 0217 05/31/19 0851  WBC 18.5*  --   --  19.1*  --   CREATININE 0.66 0.60 0.50 0.51  --   LATICACIDVEN  --   --   --   --  0.8    Estimated Creatinine Clearance: 63.5 mL/min (by C-G formula based on SCr of 0.51 mg/dL).    Allergies  Allergen Reactions  . Codeine Hives, Anxiety and Other (See Comments)    Thank you for allowing pharmacy to be a part of this patient's care.  Biagio Borg 05/31/2019 9:38 AM

## 2019-05-31 NOTE — Progress Notes (Signed)
PROGRESS NOTE    [name] [name]  WUX:[id number] DOB: January 29, 1961 DOA: 05/30/2019 PCP: Jinny Sanders, MD    Brief Narrative: 58 year old with past medical history significant for poorly controlled diabetes, insulin-dependent, diabetic gastroparesis, hypothyroidism, chronic leukocytosis who presents for evaluation of hyperglycemia, nausea and vomiting.  Patient acknowledged nonadherence to her recommended diet.  Evaluation in the ED patient was found to be in mild DKA with anion gap of 20 bicarb of 18 CBG in the 400 range.  Assessment & Plan:   Principal Problem:   DKA (diabetic ketoacidoses) ([place]) Active Problems:   Hypothyroidism   Generalized anxiety disorder   Gastroparesis due to DM ([name])   Nausea and vomiting   Uncontrolled diabetes mellitus with hyperosmolarity, with long-term current use of insulin ([name])   1-Diabetes, DKA: Patient presented with anion gap of 20, CO2 18, glucose 437. Treated with insulin gtt. Now on lantus. SSI UA negative for infection. chest x ray. Negative.   2-Abdominal Pain;  Sudden onset.  Will check CT abdomen pelvis Stat. CT scan negative for perforation, diverticulitis, did showed inflamation, vs infection esophagus, stomach, small bowel.  Start IV antibiotics.  Change PPI to IV.    leukocytosis, acute on chronic.  UA negative. Chest x ray negative.  Prior WBC old records 22 K  -hemoconcentration: Related to hypovolemia.  Hemoglobin at 17.  Continue with IV fluids. IV bolus/   -nausea, vomiting: Diabetic gastroparesis: Continue with Reglan, as needed Zofran.  -depression: Continue with Zoloft  -hypertension: Continue with losartan and metoprolol. Continue with PRN hydralazine.  -hypothyroidism: Continue with Synthroid.   DVT prophylaxis: Lovenox Code Status: full code Family Communication: care discussed with patient.  Disposition Plan:  Remain in the hospital for IV fluids, IV antibiotics, continue to have nausea, vomiting,  today develops abdominal pain. Not tolerating oral.   Consultants:  none  Procedures:   none   Antimicrobials: none   Subjective: Patient relates she develops sudden onset abdominal pain, early this am around 6--7 am.  She vomited yesterday couple time per nurse.  Not tolerating oral.   Objective: Vitals:   05/31/19 0300 05/31/19 0400 05/31/19 0415 05/31/19 0500  BP: (!) 160/86 (!) 176/124  (!) 158/88  Pulse: 99 (!) 143  100  Resp: (!) 23 (!) 37  (!) 21  Temp:   98 F (36.7 C)   TempSrc:   Oral   SpO2: 95% 98%  95%  Weight:      Height:        Intake/Output Summary (Last 24 hours) at 05/31/2019 0757 Last data filed at 05/31/2019 0650 Gross per 24 hour  Intake 3828.05 ml  Output 2350 ml  Net 1478.05 ml   Filed Weights   05/30/19 0206 05/30/19 0546  Weight: 63.5 kg 61.7 kg    Examination:  General exam: crying, anxious Respiratory system: CTA Cardiovascular system: S 1,S 2 RRR Gastrointestinal system: BS present, soft, very tender to palpation, no rigidity, mild distended Central nervous system: alert, anxious Extremities: trace edema Skin: no rashes Psychiatry: anxious    Data Reviewed: I have personally reviewed following labs and imaging studies  CBC: Recent Labs  Lab 05/30/19 0220 05/30/19 0324 05/31/19 0217  WBC 18.5*  --  19.1*  NEUTROABS  --   --  15.6*  HGB 16.9* 17.0* 15.3*  HCT 48.7* 50.0* 47.3*  MCV 82.7  --  84.8  PLT 430*  --  253   Basic Metabolic Panel: Recent Labs  Lab 05/30/19 0220 05/30/19 0324  05/30/19 0751 05/30/19 1129 05/31/19 0217  NA 139 137 136 140 136  K 3.5 3.5 3.3* 3.0* 3.5  CL 101  --  101 103 98  CO2 18*  --  23 24 23   GLUCOSE 437*  --  341* 166* 241*  BUN 14  --  12 11 7   CREATININE 0.66  --  0.60 0.50 0.51  CALCIUM 10.0  --  8.9 8.7* 8.5*   GFR: Estimated Creatinine Clearance: 63.5 mL/min (by C-G formula based on SCr of 0.51 mg/dL). Liver Function Tests: No results for input(s): AST, ALT, ALKPHOS,  BILITOT, PROT, ALBUMIN in the last 168 hours. No results for input(s): LIPASE, AMYLASE in the last 168 hours. No results for input(s): AMMONIA in the last 168 hours. Coagulation Profile: No results for input(s): INR, PROTIME in the last 168 hours. Cardiac Enzymes: No results for input(s): CKTOTAL, CKMB, CKMBINDEX, TROPONINI in the last 168 hours. BNP (last 3 results) No results for input(s): PROBNP in the last 8760 hours. HbA1C: No results for input(s): HGBA1C in the last 72 hours. CBG: Recent Labs  Lab 05/30/19 0954 05/30/19 1111 05/30/19 1222 05/30/19 1701 05/30/19 2106  GLUCAP 169* 153* 226* 217* 173*   Lipid Profile: No results for input(s): CHOL, HDL, LDLCALC, TRIG, CHOLHDL, LDLDIRECT in the last 72 hours. Thyroid Function Tests: No results for input(s): TSH, T4TOTAL, FREET4, T3FREE, THYROIDAB in the last 72 hours. Anemia Panel: No results for input(s): VITAMINB12, FOLATE, FERRITIN, TIBC, IRON, RETICCTPCT in the last 72 hours. Sepsis Labs: No results for input(s): PROCALCITON, LATICACIDVEN in the last 168 hours.  Recent Results (from the past 240 hour(s))  SARS Coronavirus 2 (CEPHEID - Performed in [place] hospital lab), Hosp Order     Status: None   Collection Time: 05/30/19  4:55 AM   Specimen: Nasopharyngeal Swab  Result Value Ref Range Status   SARS Coronavirus 2 NEGATIVE NEGATIVE Final    Comment: (NOTE) If result is NEGATIVE SARS-CoV-2 target nucleic acids are NOT DETECTED. The SARS-CoV-2 RNA is generally detectable in upper and lower  respiratory specimens during the acute phase of infection. The lowest  concentration of SARS-CoV-2 viral copies this assay can detect is 250  copies / mL. A negative result does not preclude SARS-CoV-2 infection  and should not be used as the sole basis for treatment or other  patient management decisions.  A negative result may occur with  improper specimen collection / handling, submission of specimen other  than  nasopharyngeal swab, presence of viral mutation(s) within the  areas targeted by this assay, and inadequate number of viral copies  (<250 copies / mL). A negative result must be combined with clinical  observations, patient history, and epidemiological information. If result is POSITIVE SARS-CoV-2 target nucleic acids are DETECTED. The SARS-CoV-2 RNA is generally detectable in upper and lower  respiratory specimens dur ing the acute phase of infection.  Positive  results are indicative of active infection with SARS-CoV-2.  Clinical  correlation with patient history and other diagnostic information is  necessary to determine patient infection status.  Positive results do  not rule out bacterial infection or co-infection with other viruses. If result is PRESUMPTIVE POSTIVE SARS-CoV-2 nucleic acids MAY BE PRESENT.   A presumptive positive result was obtained on the submitted specimen  and confirmed on repeat testing.  While 2019 novel coronavirus  (SARS-CoV-2) nucleic acids may be present in the submitted sample  additional confirmatory testing may be necessary for epidemiological  and / or clinical  management purposes  to differentiate between  SARS-CoV-2 and other Sarbecovirus currently known to infect humans.  If clinically indicated additional testing with an alternate test  methodology [phone]) is advised. The SARS-CoV-2 RNA is generally  detectable in upper and lower respiratory sp ecimens during the acute  phase of infection. The expected result is Negative. Fact Sheet for Patients:  StrictlyIdeas.no Fact Sheet for Healthcare Providers: BankingDealers.co.za This test is not yet approved or cleared by the [place] FDA and has been authorized for detection and/or diagnosis of SARS-CoV-2 by FDA under an Emergency Use Authorization (EUA).  This EUA will remain in effect (meaning this test can be used) for the duration of the  COVID-19 declaration under Section 564(b)(1) of the Act, 21 U.S.C. section 360bbb-3(b)(1), unless the authorization is terminated or revoked sooner. Performed at [facility], [place] [address] [place], [place] [address]   MRSA PCR Screening     Status: None   Collection Time: 05/30/19  6:05 AM   Specimen: Nasal Swab; Nasopharyngeal  Result Value Ref Range Status   MRSA by PCR NEGATIVE NEGATIVE Final    Comment:        The GeneXpert MRSA Assay (FDA approved for NASAL specimens only), is one component of a comprehensive MRSA colonization surveillance program. It is not intended to diagnose MRSA infection nor to guide or monitor treatment for MRSA infections. Performed at [facility], [place] [address], [place],  [address]          Radiology Studies: Dg Abd 1 View  Result Date: 05/30/2019 CLINICAL DATA:  Vomiting. EXAM: ABDOMEN - 1 VIEW COMPARISON:  03/25/2019 FINDINGS: The bowel gas pattern is normal. No radio-opaque calculi or other significant radiographic abnormality are seen. IMPRESSION: Negative. Electronically Signed   By: Fidela Salisbury M.D.   On: 05/30/2019 17:28   Dg Chest Port 1 View  Result Date: 05/30/2019 CLINICAL DATA:  Leukocytosis EXAM: PORTABLE CHEST 1 VIEW COMPARISON:  03/25/2019 chest radiograph. FINDINGS: Stable cardiomediastinal silhouette with normal heart size. No pneumothorax. No pleural effusion. Lungs appear clear, with no acute consolidative airspace disease and no pulmonary edema. IMPRESSION: No active disease. Electronically Signed   By: Ilona Sorrel M.D.   On: 05/30/2019 09:31        Scheduled Meds: . aspirin  81 mg Oral Daily  . Chlorhexidine Gluconate Cloth  6 each Topical Daily  . enoxaparin (LOVENOX) injection  40 mg Subcutaneous Q24H  . insulin aspart  0-5 Units Subcutaneous QHS  . insulin aspart  0-9 Units Subcutaneous TID WC  . insulin glargine  24 Units Subcutaneous Daily  .  levothyroxine  150 mcg Oral QAC breakfast  . losartan  25 mg Oral Daily  . metoCLOPramide (REGLAN) injection  10 mg Intravenous Q8H  . metoprolol succinate  25 mg Oral Daily  . pantoprazole (PROTONIX) IV  40 mg Intravenous Q12H  . rosuvastatin  40 mg Oral q1800  . sertraline  100 mg Oral Daily   Continuous Infusions: . sodium chloride 100 mL/hr at 05/31/19 0650  . sodium chloride       LOS: 0 days    Time spent: 35 minutes.     Elmarie Shiley, MD [facility] Pager [phone]  If 7PM-7AM, please contact night-coverage [email or web address] Password [facility] 05/31/2019, 7:57 AM

## 2019-05-31 NOTE — ED Provider Notes (Signed)
[facility]-ICU/STEPDOWN Provider Note   CSN: [id number] Arrival date & time: 05/30/19  0148     History   Chief Complaint Chief Complaint  Patient presents with   Hyperglycemia    HPI [name] [name] is a 58 y.o. female.     Diarrhea and vomiting a couple days ago. Then started having hyperglycemia.    Hyperglycemia Blood sugar level PTA:  >200 Severity:  Moderate Onset quality:  Gradual Duration:  2 days Timing:  Constant Progression:  Worsening Chronicity:  Recurrent Diabetes status:  Controlled with insulin Context: recent illness   Context: not noncompliance (possibly?)   Relieved by:  Nothing Ineffective treatments:  None tried   Past Medical History:  Diagnosis Date   Allergy    seasonal   Anxiety    B12 deficiency    Chest pain 04/05/2017   CHF (congestive heart failure) ([place])    Diabetes mellitus type 1, uncontrolled ([place])    "dr. just changed me to type 1, uncontrolled" (11/26/2018)   DKA (diabetic ketoacidoses) ([place]) 05/25/2018   GERD (gastroesophageal reflux disease)    Hyperlipidemia    Hypertension    [name]   Hypothyroidism    IBS (irritable bowel syndrome)    Intermittent vertigo    Kidney disease, chronic, stage 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) 10/29/2013   Leg cramping    "@ night" (09/18/2013)   Migraine    "once q couple months" (11/26/2018)   Osteoporosis    Peripheral neuropathy    Umbilical hernia    unrepaired (09/18/2013)    Patient Active Problem List   Diagnosis Date Noted   Uncontrolled diabetes mellitus with hyperosmolarity, with long-term current use of insulin ([place]) 05/30/2019   Snoring 04/10/2019   Medication management 04/10/2019   Constipation    Nausea and vomiting 03/25/2019   DKA (diabetic ketoacidosis) ([place]) 11/26/2018   SBO (small bowel obstruction) ([place]) 11/26/2018   DKA (diabetic ketoacidoses) ([place]) 09/23/2018   Tachycardia 08/04/2018   MDD (major depressive  disorder), single episode, mild ([name]) 07/01/2018   Colitis 03/07/2018   Chronic chest pain    Chronic systolic CHF (congestive heart failure) ([place]) 01/14/2018   Noncompliance 01/14/2018   Migraines 12/11/2017   Chest discomfort 09/18/2017   DKA, type 2 ([place]) 09/17/2017   Phrygian cap 06/20/2017   Leukocytosis    Lower abdominal pain    Colitis presumed infectious    Nonischemic cardiomyopathy ([place]) 03/22/2017   Hypokalemia 02/15/2017   NSTEMI (non-ST elevated myocardial infarction) ([place]) 02/11/2017   H. pylori infection 02/09/2017   Hypomagnesemia 02/09/2017   Gastroparesis due to DM ([place]) 01/22/2017   Poorly controlled type 2 diabetes mellitus with peripheral neuropathy ([place]) 07/17/2016   Atrial fibrillation ([place]) 02/01/2015   Kidney disease, chronic, stage 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) 10/29/2013   Hepatic lesion 09/18/2013   Vitamin D deficiency 05/20/2012   Essential hypertension 02/01/2011   Osteoporosis 02/01/2011   B12 deficiency 04/04/2010   Peripheral neuropathy ([place]) 05/10/2009   GERD 04/08/2009   Generalized anxiety disorder 06/22/2008   Hypothyroidism 04/29/2008   Hyperlipidemia 04/29/2008   Irritable bowel syndrome 04/29/2008    Past Surgical History:  Procedure Laterality Date   CESAREAN SECTION  1989   LAPAROSCOPIC ASSISTED VAGINAL HYSTERECTOMY  09/23/2012   Procedure: LAPAROSCOPIC ASSISTED VAGINAL HYSTERECTOMY;  Surgeon: Linda Hedges, DO;  Location: [facility];  Service: Gynecology;  Laterality: N/A;  pull Dr Gregor Hams instrument   LEFT HEART CATH AND CORONARY ANGIOGRAPHY N/A 02/11/2017   Procedure: Left Heart Cath  and Coronary Angiography;  Surgeon: Lorretta Harp, MD;  Location: [facility];  Service: Cardiovascular;  Laterality: N/A;   TUBAL LIGATION Bilateral 1992     OB History    Gravida  2   Para  2   Term      Preterm      AB      Living        SAB      TAB      Ectopic      Multiple      Live Births                 Home Medications    Prior to Admission medications   Medication Sig Start Date End Date Taking? Authorizing Provider  acetaminophen (TYLENOL) 325 MG tablet Take 650 mg by mouth every 6 (six) hours as needed for mild pain, fever or headache.   Yes [provider]  aspirin 81 MG chewable tablet Chew 1 tablet (81 mg total) by mouth daily. 02/12/17  Yes Jani Gravel, MD  fexofenadine (ALLEGRA) 180 MG tablet Take 1 tablet (180 mg total) by mouth daily. 04/02/19  Yes Bedsole, Amy E, MD  furosemide (LASIX) 20 MG tablet Take 1 tablet (20 mg total) by mouth as needed for fluid. 04/29/19  Yes Minus Breeding, MD  insulin lispro (HUMALOG KWIKPEN) 100 UNIT/ML KiwkPen Inject into skin 3 times a day at meal time according to the following sliding scale: CBG 70-120 - 0 units / CBG 121-150 - 1 unit / CBG 151-200 - 2 units / CBG 201-250 - 3 units / CBG 251-300 - 5 units / CBG 301-350 - 7 units / CBG 351-400+ - 9 units  YOU MUST FOLLOW A STRICT DIABETIC DIET Patient taking differently: Inject into the skin See admin instructions. 8 units plus sliding scale Inject into skin 3 times a day at meal time according to the following sliding scale: CBG 70-120 - 0 units / CBG 121-150 - 1 unit / CBG 151-200 - 2 units / CBG 201-250 - 3 units / CBG 251-300 - 5 units / CBG 301-350 - 7 units / CBG 351-400+ - 9 units  YOU MUST FOLLOW A STRICT DIABETIC DIET 07/01/18  Yes Bedsole, Amy E, MD  levothyroxine (SYNTHROID, LEVOTHROID) 150 MCG tablet TAKE 1 TABLET BY MOUTH EVERY DAY Patient taking differently: Take 150 mcg by mouth daily before breakfast.  11/10/18  Yes Bedsole, Amy E, MD  losartan (COZAAR) 25 MG tablet TAKE 1 TABLET BY MOUTH EVERY DAY Patient taking differently: Take 25 mg by mouth daily.  03/17/19  Yes Barrett, Evelene Croon, PA-C  metoCLOPramide (REGLAN) 5 MG tablet Take 5mg  before breakfast lunch and dinner and 10mg  at bedtime 04/29/19  Yes Milus Banister, MD  metoprolol succinate  (TOPROL-XL) 25 MG 24 hr tablet TAKE 1 TABLET BY MOUTH EVERY DAY Patient taking differently: Take 25 mg by mouth daily.  10/06/18  Yes Minus Breeding, MD  nitroGLYCERIN (NITRODUR - DOSED IN MG/24 HR) 0.2 mg/hr patch APPLY 1 PATCH TO SKIN EVERY DAY AS NEEDED Patient taking differently: Place 0.2 mg onto the skin daily.  08/27/18  Yes Barrett, Evelene Croon, PA-C  pantoprazole (PROTONIX) 40 MG tablet Take 1 tablet (40 mg total) by mouth daily at 12 noon. 01/28/18  Yes Bedsole, Amy E, MD  promethazine (PHENERGAN) 25 MG tablet TAKE 1 TABLET BY MOUTH EVERY 8 HOURS AS NEEDED FOR NAUSEA OR VOMITING. Patient taking differently: Take 25 mg by  mouth every 8 (eight) hours as needed for vomiting.  04/01/19  Yes Bedsole, Amy E, MD  rosuvastatin (CRESTOR) 40 MG tablet Take 1 tablet (40 mg total) by mouth daily. 04/10/19 07/09/19 Yes Hochrein, Jeneen Rinks, MD  sertraline (ZOLOFT) 50 MG tablet TAKE 1 TABLET BY MOUTH EVERY DAY Patient taking differently: Take 100 mg by mouth daily.  12/19/18  Yes Bedsole, Amy E, MD  TRESIBA FLEXTOUCH 100 UNIT/ML SOPN FlexTouch Pen Inject 0.24 mLs (24 Units total) into the skin at bedtime. On 10/20/18 take at lunch time, on 10/21/18 take at dinner time. On 10/22/18 resume at bedtime regimen. 10/20/18  Yes Lavina Hamman, MD  Continuous Blood Gluc Sensor (FREESTYLE LIBRE 14 DAY SENSOR) MISC by Does not apply route. 04/01/19   [provider]  Insulin Pen Needle (PEN NEEDLES) 31G X 8 MM MISC Use to inject insulin 4 times a day.  Dx: E11.10 12/23/17   Jinny Sanders, MD  Lancets (ONETOUCH ULTRASOFT) lancets Use to check blood sugar daily as needed.  Dx: E11.10 12/23/17   Jinny Sanders, MD  ONETOUCH VERIO test strip USE TO CHECK BLOOD SUGAR DAILY AS NEEDED. DX: E11.10 09/21/18   Jinny Sanders, MD    Family History Family History  Problem Relation Age of Onset   Diabetes Other    Heart disease Other    Heart failure Mother 67       Her heart skipped a beat and stopped - per pt    Diabetes Maternal Grandmother    Alzheimer's disease Maternal Grandmother    Coronary artery disease Maternal Grandmother    Heart attack Maternal Grandfather    Heart failure Brother    Diabetes Brother    Colon polyps Neg Hx    Esophageal cancer Neg Hx    Liver cancer Neg Hx    Pancreatic cancer Neg Hx    Stomach cancer Neg Hx    Crohn's disease Neg Hx    Rectal cancer Neg Hx     Social History Social History   Tobacco Use   Smoking status: Former Smoker    Packs/day: 0.12    Years: 10.00    Pack years: 1.20    Types: Cigarettes    Quit date: 12/10/1990    Years since quitting: 28.4   Smokeless tobacco: Never Used  Substance Use Topics   Alcohol use: Never    Alcohol/week: 0.0 standard drinks    Frequency: Never   Drug use: Yes    Types: Marijuana    Comment: last use 2 days ago     Allergies   Codeine   Review of Systems Review of Systems  All other systems reviewed and are negative.    Physical Exam Updated Vital Signs BP (!) 141/84    Pulse (!) 103    Temp 98 F (36.7 C) (Oral)    Resp 20    Ht 4\' 11"  (1.499 m)    Wt 61.7 kg    LMP 08/11/2012    SpO2 95%    BMI 27.47 kg/m   Physical Exam Vitals signs and nursing note reviewed.  Constitutional:      Appearance: She is well-developed.  HENT:     Head: Normocephalic and atraumatic.  Eyes:     Extraocular Movements: Extraocular movements intact.     Conjunctiva/sclera: Conjunctivae normal.     Pupils: Pupils are equal, round, and reactive to light.  Neck:     Musculoskeletal: Normal range of motion. No muscular  tenderness.  Cardiovascular:     Rate and Rhythm: Regular rhythm. Tachycardia present.  Pulmonary:     Effort: Pulmonary effort is normal. No respiratory distress.     Breath sounds: No stridor.  Abdominal:     General: There is no distension.  Musculoskeletal: Normal range of motion.        General: No swelling.  Skin:    General: Skin is warm and dry.  Neurological:       Mental Status: She is alert.      ED Treatments / Results  Labs (all labs ordered are listed, but only abnormal results are displayed) Labs Reviewed  BASIC METABOLIC PANEL - Abnormal; Notable for the following components:      Result Value   CO2 18 (*)    Glucose, Bld 437 (*)    Anion gap 20 (*)    All other components within normal limits  CBC - Abnormal; Notable for the following components:   WBC 18.5 (*)    RBC 5.89 (*)    Hemoglobin 16.9 (*)    HCT 48.7 (*)    Platelets 430 (*)    All other components within normal limits  URINALYSIS, ROUTINE W REFLEX MICROSCOPIC - Abnormal; Notable for the following components:   Specific Gravity, Urine 1.031 (*)    Glucose, UA >=500 (*)    Ketones, ur 20 (*)    Protein, ur 100 (*)    Bacteria, UA RARE (*)    All other components within normal limits  BETA-HYDROXYBUTYRIC ACID - Abnormal; Notable for the following components:   Beta-Hydroxybutyric Acid 0.45 (*)    All other components within normal limits  BASIC METABOLIC PANEL - Abnormal; Notable for the following components:   Potassium 3.3 (*)    Glucose, Bld 341 (*)    All other components within normal limits  BASIC METABOLIC PANEL - Abnormal; Notable for the following components:   Potassium 3.0 (*)    Glucose, Bld 166 (*)    Calcium 8.7 (*)    All other components within normal limits  GLUCOSE, CAPILLARY - Abnormal; Notable for the following components:   Glucose-Capillary 352 (*)    All other components within normal limits  GLUCOSE, CAPILLARY - Abnormal; Notable for the following components:   Glucose-Capillary 359 (*)    All other components within normal limits  GLUCOSE, CAPILLARY - Abnormal; Notable for the following components:   Glucose-Capillary 278 (*)    All other components within normal limits  GLUCOSE, CAPILLARY - Abnormal; Notable for the following components:   Glucose-Capillary 296 (*)    All other components within normal limits  GLUCOSE,  CAPILLARY - Abnormal; Notable for the following components:   Glucose-Capillary 209 (*)    All other components within normal limits  GLUCOSE, CAPILLARY - Abnormal; Notable for the following components:   Glucose-Capillary 169 (*)    All other components within normal limits  GLUCOSE, CAPILLARY - Abnormal; Notable for the following components:   Glucose-Capillary 153 (*)    All other components within normal limits  GLUCOSE, CAPILLARY - Abnormal; Notable for the following components:   Glucose-Capillary 226 (*)    All other components within normal limits  CBC WITH DIFFERENTIAL/PLATELET - Abnormal; Notable for the following components:   WBC 19.1 (*)    RBC 5.58 (*)    Hemoglobin 15.3 (*)    HCT 47.3 (*)    Neutro Abs 15.6 (*)    Monocytes Absolute 1.1 (*)  Abs Immature Granulocytes 0.16 (*)    All other components within normal limits  BASIC METABOLIC PANEL - Abnormal; Notable for the following components:   Glucose, Bld 241 (*)    Calcium 8.5 (*)    All other components within normal limits  GLUCOSE, CAPILLARY - Abnormal; Notable for the following components:   Glucose-Capillary 217 (*)    All other components within normal limits  GLUCOSE, CAPILLARY - Abnormal; Notable for the following components:   Glucose-Capillary 173 (*)    All other components within normal limits  HEPATIC FUNCTION PANEL - Abnormal; Notable for the following components:   AST 12 (*)    All other components within normal limits  CBG MONITORING, ED - Abnormal; Notable for the following components:   Glucose-Capillary 457 (*)    All other components within normal limits  POCT I-STAT EG7 - Abnormal; Notable for the following components:   pH, Ven 7.492 (*)    pCO2, Ven 28.5 (*)    pO2, Ven 61.0 (*)    Calcium, Ion 1.12 (*)    HCT 50.0 (*)    Hemoglobin 17.0 (*)    All other components within normal limits  CBG MONITORING, ED - Abnormal; Notable for the following components:   Glucose-Capillary 335  (*)    All other components within normal limits  CBG MONITORING, ED - Abnormal; Notable for the following components:   Glucose-Capillary 344 (*)    All other components within normal limits  SARS CORONAVIRUS 2 (HOSPITAL ORDER, [facility])  MRSA PCR SCREENING  GASTROINTESTINAL PANEL BY PCR, STOOL (REPLACES STOOL CULTURE)  LACTIC ACID, PLASMA  LIPASE, BLOOD  LACTIC ACID, PLASMA  I-STAT BETA HCG BLOOD, ED ([facility], [facility], [facility] ONLY)    EKG    Radiology Dg Abd 1 View  Result Date: 05/30/2019 CLINICAL DATA:  Vomiting. EXAM: ABDOMEN - 1 VIEW COMPARISON:  03/25/2019 FINDINGS: The bowel gas pattern is normal. No radio-opaque calculi or other significant radiographic abnormality are seen. IMPRESSION: Negative. Electronically Signed   By: Fidela Salisbury M.D.   On: 05/30/2019 17:28   Ct Abdomen Pelvis W Contrast  Result Date: 05/31/2019 CLINICAL DATA:  Inpatient. Acute abdominal pain, nausea, vomiting and diarrhea. EXAM: CT ABDOMEN AND PELVIS WITH CONTRAST TECHNIQUE: Multidetector CT imaging of the abdomen and pelvis was performed using the standard protocol following bolus administration of intravenous contrast. CONTRAST:  164mL OMNIPAQUE IOHEXOL 300 MG/ML  SOLN COMPARISON:  05/30/2019 abdominal radiograph. 11/26/2018 CT abdomen/pelvis. FINDINGS: Lower chest: No significant pulmonary nodules or acute consolidative airspace disease. Circumferential wall thickening in the lower thoracic esophagus appears new. Hepatobiliary: Normal liver size. No liver mass. Stable focal adenomyomatosis of the gallbladder fundus. No radiopaque cholelithiasis. No biliary ductal dilatation. Pancreas: Normal, with no mass or duct dilation. Spleen: Normal size. No mass. Adrenals/Urinary Tract: Normal adrenals. Normal kidneys with no hydronephrosis and no renal mass. Distended and otherwise normal bladder. Stomach/Bowel: There is generalized mild wall thickening and mucosal hyperenhancement throughout  the stomach, most prominent in the gastric antrum. There is prominent wall thickening throughout the duodenum with mucosal hyperenhancement. There is wall thickening and mucosal hyperenhancement in the proximal jejunum. No significant small bowel dilatation or focal small bowel caliber transition. Normal appendix. Mild diffuse colonic diverticulosis, with no large bowel wall thickening or significant pericolonic fat stranding. Vascular/Lymphatic: Mildly atherosclerotic nonaneurysmal abdominal aorta. Patent portal, splenic, hepatic and renal veins. No pathologically enlarged lymph nodes in the abdomen or pelvis. Reproductive: Status post hysterectomy, with  no abnormal findings at the vaginal cuff. No adnexal mass. Other: No pneumoperitoneum, ascites or focal fluid collection. Small fat containing umbilical hernia is stable. Musculoskeletal: No aggressive appearing focal osseous lesions. Mild lumbar spondylosis. IMPRESSION: 1. New wall thickening and mucosal hyperenhancement throughout the visualized lower esophagus, stomach, duodenum and proximal jejunum, most compatible with a nonspecific infectious or inflammatory esophago-gastro-enteritis. 2. Focal adenomyomatosis of the gallbladder fundus, chronic. 3. Mild colonic diverticulosis. No evidence of acute diverticulitis. 4.  Aortic Atherosclerosis (ICD10-I70.0). Electronically Signed   By: Ilona Sorrel M.D.   On: 05/31/2019 09:08   Dg Chest Port 1 View  Result Date: 05/30/2019 CLINICAL DATA:  Leukocytosis EXAM: PORTABLE CHEST 1 VIEW COMPARISON:  03/25/2019 chest radiograph. FINDINGS: Stable cardiomediastinal silhouette with normal heart size. No pneumothorax. No pleural effusion. Lungs appear clear, with no acute consolidative airspace disease and no pulmonary edema. IMPRESSION: No active disease. Electronically Signed   By: Ilona Sorrel M.D.   On: 05/30/2019 09:31    Procedures .Critical Care Performed by: Merrily Pew, MD Authorized by: Merrily Pew, MD    Critical care provider statement:    Critical care time (minutes):  31   Critical care was necessary to treat or prevent imminent or life-threatening deterioration of the following conditions:  Endocrine crisis and dehydration   Critical care was time spent personally by me on the following activities:  Discussions with consultants, evaluation of patient's response to treatment, examination of patient, ordering and performing treatments and interventions, ordering and review of laboratory studies, ordering and review of radiographic studies, pulse oximetry, re-evaluation of patient's condition, obtaining history from patient or surrogate and review of old charts   (including critical care time)  Medications Ordered in ED Medications  dextrose 50 % solution 25 mL (has no administration in time range)  aspirin chewable tablet 81 mg (81 mg Oral Not Given 05/31/19 0917)  metoprolol succinate (TOPROL-XL) 24 hr tablet 25 mg (25 mg Oral Not Given 05/31/19 0918)  rosuvastatin (CRESTOR) tablet 40 mg (40 mg Oral Not Given 05/30/19 1723)  sertraline (ZOLOFT) tablet 100 mg (100 mg Oral Not Given 05/31/19 0918)  levothyroxine (SYNTHROID) tablet 150 mcg (150 mcg Oral Not Given 05/31/19 0519)  enoxaparin (LOVENOX) injection 40 mg (40 mg Subcutaneous Not Given 05/31/19 0918)  metoCLOPramide (REGLAN) injection 10 mg (10 mg Intravenous Given 05/31/19 0518)  ondansetron (ZOFRAN) injection 4 mg (4 mg Intravenous Given 05/31/19 0221)  Chlorhexidine Gluconate Cloth 2 % PADS 6 each (6 each Topical Given 05/31/19 0918)  hydrALAZINE (APRESOLINE) injection 10 mg (10 mg Intravenous Given 05/30/19 0656)  0.9 %  sodium chloride infusion ( Intravenous Rate/Dose Verify 05/31/19 1000)  insulin aspart (novoLOG) injection 0-9 Units (3 Units Subcutaneous Given 05/31/19 0807)  insulin aspart (novoLOG) injection 0-5 Units ( Subcutaneous Not Given 05/30/19 2108)  pantoprazole (PROTONIX) injection 40 mg (40 mg Intravenous Given 05/31/19  0919)  morphine 2 MG/ML injection 1-2 mg (2 mg Intravenous Given 05/31/19 0806)  Ampicillin-Sulbactam (UNASYN) 3 g in sodium chloride 0.9 % 100 mL IVPB ( Intravenous Stopped 05/31/19 0947)  insulin glargine (LANTUS) injection 15 Units (15 Units Subcutaneous Given 05/31/19 0917)  sodium chloride (PF) 0.9 % injection (has no administration in time range)  lactated ringers bolus 1,000 mL (0 mLs Intravenous Stopped 05/30/19 0420)  promethazine (PHENERGAN) injection 25 mg (25 mg Intravenous Given 05/30/19 0313)  potassium chloride 10 mEq in 100 mL IVPB ( Intravenous Stopped 05/30/19 1051)  sodium chloride 0.9 % bolus 500 mL (500 mLs Intravenous New  Bag/Given 05/30/19 0826)  sodium chloride 0.9 % bolus 500 mL (500 mLs Intravenous New Bag/Given 05/30/19 0930)  potassium chloride 10 BPZ/025EN IVPB (  Duplicate 2/77/82 4235)  potassium chloride 10 mEq in 100 mL IVPB (0 mEq Intravenous Stopped 05/30/19 2050)  sodium chloride 0.9 % bolus 1,000 mL ( Intravenous Rate/Dose Verify 05/30/19 1834)  sodium chloride 0.9 % bolus 500 mL ( Intravenous Rate/Dose Verify 05/31/19 0841)  sodium chloride 0.9 % bolus 500 mL ( Intravenous Rate/Dose Verify 05/31/19 1000)  morphine 2 MG/ML injection (  Duplicate 3/61/44 3154)  iohexol (OMNIPAQUE) 300 MG/ML solution 100 mL (100 mLs Intravenous Contrast Given 05/31/19 0086)     Initial Impression / Assessment and Plan / ED Course  I have reviewed the triage vital signs and the nursing notes.  Pertinent labs & imaging results that were available during my care of the patient were reviewed by me and considered in my medical decision making (see chart for details).        DKA likely from GI illness. Abdomen benign. Insulin infusion to be started after K administered. D/w hospitalist.   Final Clinical Impressions(s) / ED Diagnoses   Final diagnoses:  Leukocytosis  Vomiting    ED Discharge Orders    None       Jennine Peddy, Corene Cornea, MD 05/31/19 1135

## 2019-06-01 ENCOUNTER — Inpatient Hospital Stay (HOSPITAL_COMMUNITY): Payer: 59

## 2019-06-01 LAB — CBC
HCT: 45.1 % (ref 36.0–46.0)
Hemoglobin: 15 g/dL (ref 12.0–15.0)
MCH: 28.5 pg (ref 26.0–34.0)
MCHC: 33.3 g/dL (ref 30.0–36.0)
MCV: 85.7 fL (ref 80.0–100.0)
Platelets: 289 10*3/uL (ref 150–400)
RBC: 5.26 MIL/uL — ABNORMAL HIGH (ref 3.87–5.11)
RDW: 15.3 % (ref 11.5–15.5)
WBC: 17.3 10*3/uL — ABNORMAL HIGH (ref 4.0–10.5)
nRBC: 0 % (ref 0.0–0.2)

## 2019-06-01 LAB — BASIC METABOLIC PANEL
Anion gap: 8 (ref 5–15)
BUN: 7 mg/dL (ref 6–20)
CO2: 22 mmol/L (ref 22–32)
Calcium: 7.9 mg/dL — ABNORMAL LOW (ref 8.9–10.3)
Chloride: 106 mmol/L (ref 98–111)
Creatinine, Ser: 0.54 mg/dL (ref 0.44–1.00)
GFR calc Af Amer: >60 mL/min (ref >60)
GFR calc non Af Amer: >60 mL/min (ref >60)
Glucose, Bld: 88 mg/dL (ref 70–99)
Potassium: 3.1 mmol/L — ABNORMAL LOW (ref 3.5–5.1)
Sodium: 136 mmol/L (ref 135–145)

## 2019-06-01 LAB — GLUCOSE, CAPILLARY
Glucose-Capillary: 89 mg/dL (ref 70–99)
Glucose-Capillary: 93 mg/dL (ref 70–99)
Glucose-Capillary: 144 mg/dL — ABNORMAL HIGH (ref 70–99)
Glucose-Capillary: 81 mg/dL (ref 70–99)
Glucose-Capillary: 146 mg/dL — ABNORMAL HIGH (ref 70–99)
Glucose-Capillary: 87 mg/dL (ref 70–99)

## 2019-06-01 MED ORDER — POTASSIUM CHLORIDE 10 MEQ/100ML IV SOLN
10.0000 meq | INTRAVENOUS | Status: DC
  Administered 2019-06-01: 10 meq via INTRAVENOUS
  Filled 2019-06-01 (×2): qty 100

## 2019-06-01 MED ORDER — METOCLOPRAMIDE HCL 5 MG/ML IJ SOLN
5.0000 mg | Freq: Three times a day (TID) | INTRAMUSCULAR | Status: DC
  Administered 2019-06-01 – 2019-06-03 (×7): 5 mg via INTRAVENOUS
  Filled 2019-06-01 (×7): qty 2

## 2019-06-01 MED ORDER — POTASSIUM CHLORIDE CRYS ER 20 MEQ PO TBCR
40.0000 meq | EXTENDED_RELEASE_TABLET | Freq: Once | ORAL | Status: AC
  Administered 2019-06-01: 40 meq via ORAL
  Filled 2019-06-01: qty 2

## 2019-06-01 NOTE — Progress Notes (Addendum)
PROGRESS NOTE    [name] [name]  NAT:[id number] DOB: 04-Apr-1961 DOA: 05/30/2019 PCP: Jinny Sanders, MD    Brief Narrative: 58 year old with past medical history significant for poorly controlled diabetes, insulin-dependent, diabetic gastroparesis, hypothyroidism, chronic leukocytosis who presents for evaluation of hyperglycemia, nausea and vomiting.  Patient acknowledged nonadherence to her recommended diet.  Evaluation in the ED patient was found to be in mild DKA with anion gap of 20 bicarb of 18 CBG in the 400 range.  Assessment & Plan:   Principal Problem:   DKA (diabetic ketoacidoses) ([place]) Active Problems:   Hypothyroidism   Generalized anxiety disorder   Gastroparesis due to DM ([name])   Nausea and vomiting   Uncontrolled diabetes mellitus with hyperosmolarity, with long-term current use of insulin ([name])   Uncontrolled diabetes mellitus ([name])   1-Diabetes, DKA: Patient presented with anion gap of 20, CO2 18, glucose 437. Treated with insulin gtt. Now on lantus. SSI UA negative for infection. chest x ray. Negative.   2-Abdominal Pain;  Sudden onset.  CT scan negative for perforation, diverticulitis, did showed inflamation, vs infection esophagus, stomach, small bowel.  Started IV antibiotics, Unasyn Change PPI to IV.  Pain improved today, plan for enteroscopy this admission   Leukocytosis, acute on chronic.  UA negative. Chest x ray negative.  Prior WBC old records 3 K  -Hemoconcentration: Related to hypovolemia.  Hemoglobin at 17.  Continue with IV fluids. IV bolus/   -Nausea, vomiting: Diabetic gastroparesis: Continue with Reglan, as needed Zofran.  -Depression: Continue with Zoloft  -Hypertension: Continue with losartan and metoprolol. Continue with PRN hydralazine.  -Hypothyroidism: Continue with Synthroid.   DVT prophylaxis: Lovenox Code Status: full code Family Communication: care discussed with patient. husband 6-21 Disposition Plan:  Remain in  the hospital for IV fluids, IV antibiotics, continue to have nausea, vomiting, today develops abdominal pain. Not tolerating oral.   Consultants:  none  Procedures:   none   Antimicrobials:   Unasyn 6-21   Subjective: Patient feeling better, abdominal pain has improved.  No vomiting.   Objective: Vitals:   06/01/19 0000 06/01/19 0200 06/01/19 0400 06/01/19 0600  BP: 106/76 117/60 133/63 139/85  Pulse: 90 89 89 91  Resp: 18  19 12   Temp: 98.1 F (36.7 C)  98 F (36.7 C)   TempSrc: Oral     SpO2: 96% 97% 97% 98%  Weight:      Height:        Intake/Output Summary (Last 24 hours) at 06/01/2019 0706 Last data filed at 06/01/2019 0600 Gross per 24 hour  Intake 3621.12 ml  Output 3250 ml  Net 371.12 ml   Filed Weights   05/30/19 0206 05/30/19 0546  Weight: 63.5 kg 61.7 kg    Examination:  General exam:NAD Respiratory system: CTA Cardiovascular system: S 1, S 2 RRR Gastrointestinal system: BS present, soft, less tenderness Central nervous system: Non focal.  Extremities: no edema Skin: no rashes Psychiatry: affect appropriate.     Data Reviewed: I have personally reviewed following labs and imaging studies  CBC: Recent Labs  Lab 05/30/19 0220 05/30/19 0324 05/31/19 0217 06/01/19 0227  WBC 18.5*  --  19.1* 17.3*  NEUTROABS  --   --  15.6*  --   HGB 16.9* 17.0* 15.3* 15.0  HCT 48.7* 50.0* 47.3* 45.1  MCV 82.7  --  84.8 85.7  PLT 430*  --  344 427   Basic Metabolic Panel: Recent Labs  Lab 05/30/19 0220 05/30/19 0324 05/30/19 0751  05/30/19 1129 05/31/19 0217 06/01/19 0227  NA 139 137 136 140 136 136  K 3.5 3.5 3.3* 3.0* 3.5 3.1*  CL 101  --  101 103 98 106  CO2 18*  --  23 24 23 22   GLUCOSE 437*  --  341* 166* 241* 88  BUN 14  --  12 11 7 7   CREATININE 0.66  --  0.60 0.50 0.51 0.54  CALCIUM 10.0  --  8.9 8.7* 8.5* 7.9*   GFR: Estimated Creatinine Clearance: 63.5 mL/min (by C-G formula based on SCr of 0.54 mg/dL). Liver Function Tests:  Recent Labs  Lab 05/31/19 0212  AST 12*  ALT 12  ALKPHOS 80  BILITOT 0.6  PROT 7.0  ALBUMIN 3.8   Recent Labs  Lab 05/31/19 0212  LIPASE 40   No results for input(s): AMMONIA in the last 168 hours. Coagulation Profile: No results for input(s): INR, PROTIME in the last 168 hours. Cardiac Enzymes: No results for input(s): CKTOTAL, CKMB, CKMBINDEX, TROPONINI in the last 168 hours. BNP (last 3 results) No results for input(s): PROBNP in the last 8760 hours. HbA1C: No results for input(s): HGBA1C in the last 72 hours. CBG: Recent Labs  Lab 05/31/19 0757 05/31/19 1215 05/31/19 1556 05/31/19 2203 06/01/19 0215  GLUCAP 237* 152* 124* 92 89   Lipid Profile: No results for input(s): CHOL, HDL, LDLCALC, TRIG, CHOLHDL, LDLDIRECT in the last 72 hours. Thyroid Function Tests: No results for input(s): TSH, T4TOTAL, FREET4, T3FREE, THYROIDAB in the last 72 hours. Anemia Panel: No results for input(s): VITAMINB12, FOLATE, FERRITIN, TIBC, IRON, RETICCTPCT in the last 72 hours. Sepsis Labs: Recent Labs  Lab 05/31/19 0851 05/31/19 1143  LATICACIDVEN 0.8 1.1    Recent Results (from the past 240 hour(s))  SARS Coronavirus 2 (CEPHEID - Performed in [facility] hospital lab), Hosp Order     Status: None   Collection Time: 05/30/19  4:55 AM   Specimen: Nasopharyngeal Swab  Result Value Ref Range Status   SARS Coronavirus 2 NEGATIVE NEGATIVE Final    Comment: (NOTE) If result is NEGATIVE SARS-CoV-2 target nucleic acids are NOT DETECTED. The SARS-CoV-2 RNA is generally detectable in upper and lower  respiratory specimens during the acute phase of infection. The lowest  concentration of SARS-CoV-2 viral copies this assay can detect is 250  copies / mL. A negative result does not preclude SARS-CoV-2 infection  and should not be used as the sole basis for treatment or other  patient management decisions.  A negative result may occur with  improper specimen collection / handling,  submission of specimen other  than nasopharyngeal swab, presence of viral mutation(s) within the  areas targeted by this assay, and inadequate number of viral copies  (<250 copies / mL). A negative result must be combined with clinical  observations, patient history, and epidemiological information. If result is POSITIVE SARS-CoV-2 target nucleic acids are DETECTED. The SARS-CoV-2 RNA is generally detectable in upper and lower  respiratory specimens dur ing the acute phase of infection.  Positive  results are indicative of active infection with SARS-CoV-2.  Clinical  correlation with patient history and other diagnostic information is  necessary to determine patient infection status.  Positive results do  not rule out bacterial infection or co-infection with other viruses. If result is PRESUMPTIVE POSTIVE SARS-CoV-2 nucleic acids MAY BE PRESENT.   A presumptive positive result was obtained on the submitted specimen  and confirmed on repeat testing.  While 2019 novel coronavirus  (SARS-CoV-2) nucleic  acids may be present in the submitted sample  additional confirmatory testing may be necessary for epidemiological  and / or clinical management purposes  to differentiate between  SARS-CoV-2 and other Sarbecovirus currently known to infect humans.  If clinically indicated additional testing with an alternate test  methodology [phone]) is advised. The SARS-CoV-2 RNA is generally  detectable in upper and lower respiratory sp ecimens during the acute  phase of infection. The expected result is Negative. Fact Sheet for Patients:  StrictlyIdeas.no Fact Sheet for Healthcare Providers: BankingDealers.co.za This test is not yet approved or cleared by the [place] FDA and has been authorized for detection and/or diagnosis of SARS-CoV-2 by FDA under an Emergency Use Authorization (EUA).  This EUA will remain in effect (meaning this test can be  used) for the duration of the COVID-19 declaration under Section 564(b)(1) of the Act, 21 U.S.C. section 360bbb-3(b)(1), unless the authorization is terminated or revoked sooner. Performed at [facility], [place] [address] [place], [place] [address]   MRSA PCR Screening     Status: None   Collection Time: 05/30/19  6:05 AM   Specimen: Nasal Swab; Nasopharyngeal  Result Value Ref Range Status   MRSA by PCR NEGATIVE NEGATIVE Final    Comment:        The GeneXpert MRSA Assay (FDA approved for NASAL specimens only), is one component of a comprehensive MRSA colonization surveillance program. It is not intended to diagnose MRSA infection nor to guide or monitor treatment for MRSA infections. Performed at [facility], [place] [address], [place], [place] [address]          Radiology Studies: Dg Abd 1 View  Result Date: 05/30/2019 CLINICAL DATA:  Vomiting. EXAM: ABDOMEN - 1 VIEW COMPARISON:  03/25/2019 FINDINGS: The bowel gas pattern is normal. No radio-opaque calculi or other significant radiographic abnormality are seen. IMPRESSION: Negative. Electronically Signed   By: Fidela Salisbury M.D.   On: 05/30/2019 17:28   Ct Abdomen Pelvis W Contrast  Result Date: 05/31/2019 CLINICAL DATA:  Inpatient. Acute abdominal pain, nausea, vomiting and diarrhea. EXAM: CT ABDOMEN AND PELVIS WITH CONTRAST TECHNIQUE: Multidetector CT imaging of the abdomen and pelvis was performed using the standard protocol following bolus administration of intravenous contrast. CONTRAST:  172mL OMNIPAQUE IOHEXOL 300 MG/ML  SOLN COMPARISON:  05/30/2019 abdominal radiograph. 11/26/2018 CT abdomen/pelvis. FINDINGS: Lower chest: No significant pulmonary nodules or acute consolidative airspace disease. Circumferential wall thickening in the lower thoracic esophagus appears new. Hepatobiliary: Normal liver size. No liver mass. Stable focal adenomyomatosis of the gallbladder fundus. No  radiopaque cholelithiasis. No biliary ductal dilatation. Pancreas: Normal, with no mass or duct dilation. Spleen: Normal size. No mass. Adrenals/Urinary Tract: Normal adrenals. Normal kidneys with no hydronephrosis and no renal mass. Distended and otherwise normal bladder. Stomach/Bowel: There is generalized mild wall thickening and mucosal hyperenhancement throughout the stomach, most prominent in the gastric antrum. There is prominent wall thickening throughout the duodenum with mucosal hyperenhancement. There is wall thickening and mucosal hyperenhancement in the proximal jejunum. No significant small bowel dilatation or focal small bowel caliber transition. Normal appendix. Mild diffuse colonic diverticulosis, with no large bowel wall thickening or significant pericolonic fat stranding. Vascular/Lymphatic: Mildly atherosclerotic nonaneurysmal abdominal aorta. Patent portal, splenic, hepatic and renal veins. No pathologically enlarged lymph nodes in the abdomen or pelvis. Reproductive: Status post hysterectomy, with no abnormal findings at the vaginal cuff. No adnexal mass. Other: No pneumoperitoneum, ascites or focal fluid collection. Small fat containing umbilical hernia  is stable. Musculoskeletal: No aggressive appearing focal osseous lesions. Mild lumbar spondylosis. IMPRESSION: 1. New wall thickening and mucosal hyperenhancement throughout the visualized lower esophagus, stomach, duodenum and proximal jejunum, most compatible with a nonspecific infectious or inflammatory esophago-gastro-enteritis. 2. Focal adenomyomatosis of the gallbladder fundus, chronic. 3. Mild colonic diverticulosis. No evidence of acute diverticulitis. 4.  Aortic Atherosclerosis (ICD10-I70.0). Electronically Signed   By: Ilona Sorrel M.D.   On: 05/31/2019 09:08   Dg Chest Port 1 View  Result Date: 05/30/2019 CLINICAL DATA:  Leukocytosis EXAM: PORTABLE CHEST 1 VIEW COMPARISON:  03/25/2019 chest radiograph. FINDINGS: Stable  cardiomediastinal silhouette with normal heart size. No pneumothorax. No pleural effusion. Lungs appear clear, with no acute consolidative airspace disease and no pulmonary edema. IMPRESSION: No active disease. Electronically Signed   By: Ilona Sorrel M.D.   On: 05/30/2019 09:31        Scheduled Meds: . aspirin  81 mg Oral Daily  . Chlorhexidine Gluconate Cloth  6 each Topical Daily  . enoxaparin (LOVENOX) injection  40 mg Subcutaneous Q24H  . insulin aspart  0-5 Units Subcutaneous QHS  . insulin aspart  0-9 Units Subcutaneous TID WC  . insulin glargine  15 Units Subcutaneous Daily  . levothyroxine  150 mcg Oral QAC breakfast  . metoCLOPramide (REGLAN) injection  10 mg Intravenous Q8H  . metoprolol succinate  25 mg Oral Daily  . pantoprazole (PROTONIX) IV  40 mg Intravenous Q12H  . rosuvastatin  40 mg Oral q1800  . sertraline  100 mg Oral Daily   Continuous Infusions: . sodium chloride 100 mL/hr at 06/01/19 0233  . ampicillin-sulbactam (UNASYN) IV Stopped (06/01/19 0304)  . potassium chloride       LOS: 1 day    Time spent: 35 minutes.     Elmarie Shiley, MD [facility] Pager [phone]  If 7PM-7AM, please contact night-coverage [email or web address] Password [facility] 06/01/2019, 7:06 AM

## 2019-06-01 NOTE — Progress Notes (Addendum)
Patient ID: [name] [name], female   DOB: [birth year], 58 y.o.   MRN: [id number]    Progress Note   Subjective  Day #3 Unasyn empirically Patient says she feels much better today, no nausea or abdominal pain since yesterday, hungry.  No bowel movements but passing flatus.   Objective   Vital signs in last 24 hours: Temp:  [97.7 F (36.5 C)-98.7 F (37.1 C)] 98 F (36.7 C) (06/22 0400) Pulse Rate:  [88-106] 91 (06/22 0600) Resp:  [12-26] 12 (06/22 0600) BP: (97-155)/(56-98) 139/85 (06/22 0600) SpO2:  [94 %-98 %] 98 % (06/22 0600) Last BM Date: 05/29/19 General:    white female in NAD Heart:  Regular rate and rhythm; no murmurs Lungs: Respirations even and unlabored, lungs CTA bilaterally Abdomen:  Soft, nontender and nondistended. Normal bowel sounds. Extremities:  Without edema. Neurologic:  Alert and oriented,  grossly normal neurologically. Psych:  Cooperative. Normal mood and affect.  Intake/Output from previous day: 06/21 0701 - 06/22 0700 In: 3621.1 [I.V.:2148.1; IV Piggyback:1473.1] Out: 3250 [Urine:3250] Intake/Output this shift: No intake/output data recorded.  Lab Results: Recent Labs    05/30/19 0220 05/30/19 0324 05/31/19 0217 06/01/19 0227  WBC 18.5*  --  19.1* 17.3*  HGB 16.9* 17.0* 15.3* 15.0  HCT 48.7* 50.0* 47.3* 45.1  PLT 430*  --  344 289   BMET Recent Labs    05/30/19 1129 05/31/19 0217 06/01/19 0227  NA 140 136 136  K 3.0* 3.5 3.1*  CL 103 98 106  CO2 24 23 22   GLUCOSE 166* 241* 88  BUN 11 7 7   CREATININE 0.50 0.51 0.54  CALCIUM 8.7* 8.5* 7.9*   LFT Recent Labs    05/31/19 0212  PROT 7.0  ALBUMIN 3.8  AST 12*  ALT 12  ALKPHOS 80  BILITOT 0.6  BILIDIR 0.1  IBILI 0.5   PT/INR No results for input(s): LABPROT, INR in the last 72 hours.  Studies/Results: Dg Abd 1 View  Result Date: 05/30/2019 CLINICAL DATA:  Vomiting. EXAM: ABDOMEN - 1 VIEW COMPARISON:  03/25/2019 FINDINGS: The bowel gas pattern is normal. No radio-opaque  calculi or other significant radiographic abnormality are seen. IMPRESSION: Negative. Electronically Signed   By: Fidela Salisbury M.D.   On: 05/30/2019 17:28   Ct Abdomen Pelvis W Contrast  Result Date: 05/31/2019 CLINICAL DATA:  Inpatient. Acute abdominal pain, nausea, vomiting and diarrhea. EXAM: CT ABDOMEN AND PELVIS WITH CONTRAST TECHNIQUE: Multidetector CT imaging of the abdomen and pelvis was performed using the standard protocol following bolus administration of intravenous contrast. CONTRAST:  135mL OMNIPAQUE IOHEXOL 300 MG/ML  SOLN COMPARISON:  05/30/2019 abdominal radiograph. 11/26/2018 CT abdomen/pelvis. FINDINGS: Lower chest: No significant pulmonary nodules or acute consolidative airspace disease. Circumferential wall thickening in the lower thoracic esophagus appears new. Hepatobiliary: Normal liver size. No liver mass. Stable focal adenomyomatosis of the gallbladder fundus. No radiopaque cholelithiasis. No biliary ductal dilatation. Pancreas: Normal, with no mass or duct dilation. Spleen: Normal size. No mass. Adrenals/Urinary Tract: Normal adrenals. Normal kidneys with no hydronephrosis and no renal mass. Distended and otherwise normal bladder. Stomach/Bowel: There is generalized mild wall thickening and mucosal hyperenhancement throughout the stomach, most prominent in the gastric antrum. There is prominent wall thickening throughout the duodenum with mucosal hyperenhancement. There is wall thickening and mucosal hyperenhancement in the proximal jejunum. No significant small bowel dilatation or focal small bowel caliber transition. Normal appendix. Mild diffuse colonic diverticulosis, with no large bowel wall thickening or significant pericolonic fat stranding. Vascular/Lymphatic: Mildly  atherosclerotic nonaneurysmal abdominal aorta. Patent portal, splenic, hepatic and renal veins. No pathologically enlarged lymph nodes in the abdomen or pelvis. Reproductive: Status post hysterectomy, with  no abnormal findings at the vaginal cuff. No adnexal mass. Other: No pneumoperitoneum, ascites or focal fluid collection. Small fat containing umbilical hernia is stable. Musculoskeletal: No aggressive appearing focal osseous lesions. Mild lumbar spondylosis. IMPRESSION: 1. New wall thickening and mucosal hyperenhancement throughout the visualized lower esophagus, stomach, duodenum and proximal jejunum, most compatible with a nonspecific infectious or inflammatory esophago-gastro-enteritis. 2. Focal adenomyomatosis of the gallbladder fundus, chronic. 3. Mild colonic diverticulosis. No evidence of acute diverticulitis. 4.  Aortic Atherosclerosis (ICD10-I70.0). Electronically Signed   By: Ilona Sorrel M.D.   On: 05/31/2019 09:08   Dg Chest Port 1 View  Result Date: 05/30/2019 CLINICAL DATA:  Leukocytosis EXAM: PORTABLE CHEST 1 VIEW COMPARISON:  03/25/2019 chest radiograph. FINDINGS: Stable cardiomediastinal silhouette with normal heart size. No pneumothorax. No pleural effusion. Lungs appear clear, with no acute consolidative airspace disease and no pulmonary edema. IMPRESSION: No active disease. Electronically Signed   By: Ilona Sorrel M.D.   On: 05/30/2019 09:31   Dg Abd Portable 1v  Result Date: 06/01/2019 CLINICAL DATA:  Abdominal pain. EXAM: PORTABLE ABDOMEN - 1 VIEW COMPARISON:  CT scan of May 31, 2019.  Radiograph of May 30, 2019. FINDINGS: The bowel gas pattern is normal. No radio-opaque calculi or other significant radiographic abnormality are seen. IMPRESSION: No evidence of bowel obstruction or ileus. Electronically Signed   By: Marijo Conception M.D.   On: 06/01/2019 07:26       Assessment / Plan:    #78 58 year old white female admitted with nausea vomiting and abdominal pain and found to be in DKA. DKA resolved, patient feeling much better. Nausea and vomiting onset after head discontinued chronic marijuana use 05/25/2019 question correlation.  Suspect acute exacerbation of gastroparesis  with DKA CT of the abdomen pelvis showed new wall thickening of the lower esophagus, stomach, duodenum and proximal jejunum- infectious versus inflammatory Patient has been on empiric Unasyn GI path panel ordered but no diarrhea  We will start clear liquid diet today, advance as tolerated.  Will continue IV PPI, IV antiemetics and continue IV Reglan but decrease dose today.  If she tolerates p.o.'s can convert GI meds to p.o. Will consider EGD if patient has further abdominal pain nausea and vomiting.  #2 leukocytosis-appears to have some component of chronicity #3 CHF stable #4 hypokalemia-needs correction per medicine         Principal Problem:   DKA (diabetic ketoacidoses) ([place]) Active Problems:   Hypothyroidism   Generalized anxiety disorder   Gastroparesis due to DM ([name])   Nausea and vomiting   Uncontrolled diabetes mellitus with hyperosmolarity, with long-term current use of insulin ([place])   Uncontrolled diabetes mellitus ([place])     LOS: 1 day   Amy Esterwood  06/01/2019, 8:55 AM     Attending physician's note   I have taken an interval history, reviewed the chart and examined the patient. I agree with the Advanced Practitioner's note, impression and recommendations.   N/V with DKA (improving) with H/O marijuana use.  Tolerating clear liquid diet. D/d-marijuana hyperemesis syndrome. With likely associated gastroparesis. CT Abdo/pelvis showing wall thickening of the lower esophagus, stomach, duodenum and proximal jejunum-infectious versus inflammatory.  Plan: -Advance diet as tolerated. -Continue IV Protonix, IV Reglan, IV Zofran, IV Unasyn for now. -Enteroscopy likely 6/24 -Would need outpatient solid-phase gastric emptying scan. -Best possible control of  diabetes as out-pt. -Stop all marijuana use.   Carmell Austria, MD [facility] [phone].

## 2019-06-02 DIAGNOSIS — K2980 Duodenitis without bleeding: Secondary | ICD-10-CM

## 2019-06-02 DIAGNOSIS — K529 Noninfective gastroenteritis and colitis, unspecified: Secondary | ICD-10-CM

## 2019-06-02 DIAGNOSIS — R1084 Generalized abdominal pain: Secondary | ICD-10-CM

## 2019-06-02 LAB — BASIC METABOLIC PANEL
Anion gap: 11 (ref 5–15)
BUN: 5 mg/dL — ABNORMAL LOW (ref 6–20)
CO2: 21 mmol/L — ABNORMAL LOW (ref 22–32)
Calcium: 8.1 mg/dL — ABNORMAL LOW (ref 8.9–10.3)
Chloride: 103 mmol/L (ref 98–111)
Creatinine, Ser: 0.51 mg/dL (ref 0.44–1.00)
GFR calc Af Amer: >60 mL/min (ref >60)
GFR calc non Af Amer: >60 mL/min (ref >60)
Glucose, Bld: 97 mg/dL (ref 70–99)
Potassium: 3.3 mmol/L — ABNORMAL LOW (ref 3.5–5.1)
Sodium: 135 mmol/L (ref 135–145)

## 2019-06-02 LAB — CBC
HCT: 43.6 % (ref 36.0–46.0)
Hemoglobin: 14.6 g/dL (ref 12.0–15.0)
MCH: 28.6 pg (ref 26.0–34.0)
MCHC: 33.5 g/dL (ref 30.0–36.0)
MCV: 85.3 fL (ref 80.0–100.0)
Platelets: 298 10*3/uL (ref 150–400)
RBC: 5.11 MIL/uL (ref 3.87–5.11)
RDW: 15.3 % (ref 11.5–15.5)
WBC: 11.2 10*3/uL — ABNORMAL HIGH (ref 4.0–10.5)
nRBC: 0 % (ref 0.0–0.2)

## 2019-06-02 LAB — GLUCOSE, CAPILLARY
Glucose-Capillary: 83 mg/dL (ref 70–99)
Glucose-Capillary: 119 mg/dL — ABNORMAL HIGH (ref 70–99)
Glucose-Capillary: 278 mg/dL — ABNORMAL HIGH (ref 70–99)
Glucose-Capillary: 125 mg/dL — ABNORMAL HIGH (ref 70–99)

## 2019-06-02 MED ORDER — POTASSIUM CHLORIDE CRYS ER 20 MEQ PO TBCR
40.0000 meq | EXTENDED_RELEASE_TABLET | Freq: Once | ORAL | Status: AC
  Administered 2019-06-02: 40 meq via ORAL
  Filled 2019-06-02: qty 2

## 2019-06-02 NOTE — Progress Notes (Signed)
Inpatient Diabetes Program Recommendations  AACE/ADA: New Consensus Statement on Inpatient Glycemic Control (2015)  Target Ranges:  Prepandial:   less than 140 mg/dL      Peak postprandial:   less than 180 mg/dL (1-2 hours)      Critically ill patients:  140 - 180 mg/dL   Lab Results  Component Value Date   GLUCAP 83 06/02/2019   HGBA1C 11.6 (H) 03/25/2019    Review of Glycemic Control  Diabetes history: DM2 Outpatient Diabetes medications: Tresiba 24 units QHS, Humalog 8 units + 0-9 units tidwc Current orders for Inpatient glycemic control: Novolog 0-9 units tidwc and hs, Lantus 15 units QD  Blood sugars look pretty good thus far today. NPO for enteroscopy today. Will need meal coverage insulin when diet ordered and pt eating.  Inpatient Diabetes Program Recommendations:     Add Novolog 4 units (1/2 home dose) tidwc for meal coverage insulin.  Spoke with pt regarding her HgbA1C of 11.6%. Pt states it's down from 14%. States she's had a lot of stress lately with family issues and hasn't been focused on controlling her blood sugars. Stressed importance of monitoring, f/u with PCP/endo, taking insulin as prescribed, eating healthy diet and no skipping meals, exercise at least 30 min/day (walking). Gives insulin in abdomen, rotating sites. Stressed importance of improving glycemic control to prevent long-term complications. Will also help with gastroparesis.  Continue to follow.   Thank you. Lorenda Peck, RD, LDN, CDE Inpatient Diabetes Coordinator [phone]

## 2019-06-02 NOTE — Progress Notes (Signed)
PROGRESS NOTE    [name] [name]  IZT:[id number] DOB: 09-05-1961 DOA: 05/30/2019 PCP: Jinny Sanders, MD    Brief Narrative: 58 year old with past medical history significant for poorly controlled diabetes, insulin-dependent, diabetic gastroparesis, hypothyroidism, chronic leukocytosis who presents for evaluation of hyperglycemia, nausea and vomiting.  Patient acknowledged nonadherence to her recommended diet.  Evaluation in the ED patient was found to be in mild DKA with anion gap of 20 bicarb of 18 CBG in the 400 range.  Patient was admitted with DKA: She was treated with IV fluids and insulin drip.  Subsequently she was transitioned to Lantus and a sliding scale insulin.  Patient continued to have nausea and vomiting and developed sudden onset abdominal pain 05/31/2019.  CT abdomen was negative for perforation, diverticulitis, did show inflammation versus infection in the esophagus stomach and small bowel.  Patient was a started on IV Unasyn.  GI was consulted and plan is for endoscopy enteroscopy on 6/24.     Assessment & Plan:   Principal Problem:   DKA (diabetic ketoacidoses) ([place]) Active Problems:   Hypothyroidism   Generalized anxiety disorder   Gastroparesis due to DM ([name])   Nausea and vomiting   Uncontrolled diabetes mellitus with hyperosmolarity, with long-term current use of insulin ([name])   Uncontrolled diabetes mellitus ([name])   1-Diabetes, DKA: Patient presented with anion gap of 20, CO2 18, glucose 437. Treated with insulin gtt. Now on lantus. SSI UA negative for infection. chest x ray. Negative.   2-Abdominal Pain; nausea vomiting CT scan negative for perforation, diverticulitis, did showed inflamation, vs infection esophagus, stomach, small bowel.  Started IV antibiotics, Unasyn Change PPI to IV.  Plan for enteroscopy 6-24.   Leukocytosis, acute on chronic.  UA negative. Chest x ray negative.  Prior WBC old records 16 K CT abdomen: With inflammation versus  infection in the esophagus, stomach and small bowel.  -Hemoconcentration: Related to hypovolemia.  Hemoglobin at 17.  Continue with IV fluids. IV bolus/   -Nausea, vomiting: Diabetic gastroparesis: Continue with Reglan, as needed Zofran.  -Depression: Continue with Zoloft  -Hypertension: Continue with losartan and metoprolol. Continue with PRN hydralazine.  -Hypothyroidism: Continue with Synthroid.  -Hypokalemia;  Replete oral.   DVT prophylaxis: Lovenox Code Status: full code Family Communication: care discussed with patient. husband 6-21 Disposition Plan:  Remain in the hospital for enteroscopy  on 6/24  Consultants:  none  Procedures:   none   Antimicrobials:   Unasyn 6-21   Subjective: Patient is feeling better, denies abdominal pain, tolerating regular diet.  Objective: Vitals:   06/01/19 1200 06/01/19 1652 06/01/19 2050 06/02/19 0617  BP: 137/86 (!) 147/98 118/81 124/85  Pulse: 98 87 84 80  Resp: 12 16 16 18   Temp: 98 F (36.7 C) 98.6 F (37 C) 98.2 F (36.8 C) 98.6 F (37 C)  TempSrc: Oral Oral Oral Oral  SpO2: 98% 100% 99% 100%  Weight:      Height:        Intake/Output Summary (Last 24 hours) at 06/02/2019 1329 Last data filed at 06/02/2019 1011 Gross per 24 hour  Intake 3231.33 ml  Output 1500 ml  Net 1731.33 ml   Filed Weights   05/30/19 0206 05/30/19 0546  Weight: 63.5 kg 61.7 kg    Examination:  General exam: NAD Respiratory system: CTA Cardiovascular system: SS 1, S 2 RRR Gastrointestinal system: BS, Present, soft, NT Central nervous system; Non focal.  Extremities: No edema.  Skin: No rashes Psychiatry: Affect appropriate  Data Reviewed: I have personally reviewed following labs and imaging studies  CBC: Recent Labs  Lab 05/30/19 0220 05/30/19 0324 05/31/19 0217 06/01/19 0227 06/02/19 0824  WBC 18.5*  --  19.1* 17.3* 11.2*  NEUTROABS  --   --  15.6*  --   --   HGB 16.9* 17.0* 15.3* 15.0 14.6  HCT 48.7* 50.0*  47.3* 45.1 43.6  MCV 82.7  --  84.8 85.7 85.3  PLT 430*  --  344 289 229   Basic Metabolic Panel: Recent Labs  Lab 05/30/19 0751 05/30/19 1129 05/31/19 0217 06/01/19 0227 06/02/19 0824  NA 136 140 136 136 135  K 3.3* 3.0* 3.5 3.1* 3.3*  CL 101 103 98 106 103  CO2 23 24 23 22  21*  GLUCOSE 341* 166* 241* 88 97  BUN 12 11 7 7  5*  CREATININE 0.60 0.50 0.51 0.54 0.51  CALCIUM 8.9 8.7* 8.5* 7.9* 8.1*   GFR: Estimated Creatinine Clearance: 63.5 mL/min (by C-G formula based on SCr of 0.51 mg/dL). Liver Function Tests: Recent Labs  Lab 05/31/19 0212  AST 12*  ALT 12  ALKPHOS 80  BILITOT 0.6  PROT 7.0  ALBUMIN 3.8   Recent Labs  Lab 05/31/19 0212  LIPASE 40   No results for input(s): AMMONIA in the last 168 hours. Coagulation Profile: No results for input(s): INR, PROTIME in the last 168 hours. Cardiac Enzymes: No results for input(s): CKTOTAL, CKMB, CKMBINDEX, TROPONINI in the last 168 hours. BNP (last 3 results) No results for input(s): PROBNP in the last 8760 hours. HbA1C: No results for input(s): HGBA1C in the last 72 hours. CBG: Recent Labs  Lab 06/01/19 1529 06/01/19 1707 06/01/19 2147 06/02/19 0737 06/02/19 1201  GLUCAP 81 146* 87 83 119*   Lipid Profile: No results for input(s): CHOL, HDL, LDLCALC, TRIG, CHOLHDL, LDLDIRECT in the last 72 hours. Thyroid Function Tests: No results for input(s): TSH, T4TOTAL, FREET4, T3FREE, THYROIDAB in the last 72 hours. Anemia Panel: No results for input(s): VITAMINB12, FOLATE, FERRITIN, TIBC, IRON, RETICCTPCT in the last 72 hours. Sepsis Labs: Recent Labs  Lab 05/31/19 0851 05/31/19 1143  LATICACIDVEN 0.8 1.1    Recent Results (from the past 240 hour(s))  SARS Coronavirus 2 (CEPHEID - Performed in [facility] hospital lab), Hosp Order     Status: None   Collection Time: 05/30/19  4:55 AM   Specimen: Nasopharyngeal Swab  Result Value Ref Range Status   SARS Coronavirus 2 NEGATIVE NEGATIVE Final    Comment:  (NOTE) If result is NEGATIVE SARS-CoV-2 target nucleic acids are NOT DETECTED. The SARS-CoV-2 RNA is generally detectable in upper and lower  respiratory specimens during the acute phase of infection. The lowest  concentration of SARS-CoV-2 viral copies this assay can detect is 250  copies / mL. A negative result does not preclude SARS-CoV-2 infection  and should not be used as the sole basis for treatment or other  patient management decisions.  A negative result may occur with  improper specimen collection / handling, submission of specimen other  than nasopharyngeal swab, presence of viral mutation(s) within the  areas targeted by this assay, and inadequate number of viral copies  (<250 copies / mL). A negative result must be combined with clinical  observations, patient history, and epidemiological information. If result is POSITIVE SARS-CoV-2 target nucleic acids are DETECTED. The SARS-CoV-2 RNA is generally detectable in upper and lower  respiratory specimens dur ing the acute phase of infection.  Positive  results are indicative  of active infection with SARS-CoV-2.  Clinical  correlation with patient history and other diagnostic information is  necessary to determine patient infection status.  Positive results do  not rule out bacterial infection or co-infection with other viruses. If result is PRESUMPTIVE POSTIVE SARS-CoV-2 nucleic acids MAY BE PRESENT.   A presumptive positive result was obtained on the submitted specimen  and confirmed on repeat testing.  While 2019 novel coronavirus  (SARS-CoV-2) nucleic acids may be present in the submitted sample  additional confirmatory testing may be necessary for epidemiological  and / or clinical management purposes  to differentiate between  SARS-CoV-2 and other Sarbecovirus currently known to infect humans.  If clinically indicated additional testing with an alternate test  methodology [phone]) is advised. The SARS-CoV-2 RNA is  generally  detectable in upper and lower respiratory sp ecimens during the acute  phase of infection. The expected result is Negative. Fact Sheet for Patients:  StrictlyIdeas.no Fact Sheet for Healthcare Providers: BankingDealers.co.za This test is not yet approved or cleared by the [place] FDA and has been authorized for detection and/or diagnosis of SARS-CoV-2 by FDA under an Emergency Use Authorization (EUA).  This EUA will remain in effect (meaning this test can be used) for the duration of the COVID-19 declaration under Section 564(b)(1) of the Act, 21 U.S.C. section 360bbb-3(b)(1), unless the authorization is terminated or revoked sooner. Performed at [facility], [place] [address], [place], [place] [address]   MRSA PCR Screening     Status: None   Collection Time: 05/30/19  6:05 AM   Specimen: Nasal Swab; Nasopharyngeal  Result Value Ref Range Status   MRSA by PCR NEGATIVE NEGATIVE Final    Comment:        The GeneXpert MRSA Assay (FDA approved for NASAL specimens only), is one component of a comprehensive MRSA colonization surveillance program. It is not intended to diagnose MRSA infection nor to guide or monitor treatment for MRSA infections. Performed at [facility], [place] [address] [place], [place] [address]          Radiology Studies: Dg Abd Portable 1v  Result Date: 06/01/2019 CLINICAL DATA:  Abdominal pain. EXAM: PORTABLE ABDOMEN - 1 VIEW COMPARISON:  CT scan of May 31, 2019.  Radiograph of May 30, 2019. FINDINGS: The bowel gas pattern is normal. No radio-opaque calculi or other significant radiographic abnormality are seen. IMPRESSION: No evidence of bowel obstruction or ileus. Electronically Signed   By: Marijo Conception M.D.   On: 06/01/2019 07:26        Scheduled Meds: . aspirin  81 mg Oral Daily  . Chlorhexidine Gluconate Cloth  6 each Topical Daily  .  insulin aspart  0-5 Units Subcutaneous QHS  . insulin aspart  0-9 Units Subcutaneous TID WC  . insulin glargine  15 Units Subcutaneous Daily  . levothyroxine  150 mcg Oral QAC breakfast  . metoCLOPramide (REGLAN) injection  5 mg Intravenous TID BM  . metoprolol succinate  25 mg Oral Daily  . pantoprazole (PROTONIX) IV  40 mg Intravenous Q12H  . potassium chloride  40 mEq Oral Once  . rosuvastatin  40 mg Oral q1800  . sertraline  100 mg Oral Daily   Continuous Infusions: . sodium chloride 100 mL/hr at 06/02/19 1002  . ampicillin-sulbactam (UNASYN) IV 3 g (06/02/19 0852)     LOS: 2 days    Time spent: 35 minutes.     Elmarie Shiley, MD [facility] Pager [phone]  If 7PM-7AM, please contact night-coverage [email or web address] Password [facility] 06/02/2019, 1:29 PM

## 2019-06-02 NOTE — H&P (View-Only) (Signed)
Patient ID: [name] [name], female   DOB: [birth year], 58 y.o.   MRN: [id number]    Progress Note   Subjective   Continues to improve- no c/o abd pain,or vomiting- tolerated liquids yesterday  WBC down to 11.2  On Unasyn    Objective   Vital signs in last 24 hours: Temp:  [98 F (36.7 C)-98.6 F (37 C)] 98.6 F (37 C) (06/23 0617) Pulse Rate:  [80-106] 80 (06/23 0617) Resp:  [12-21] 18 (06/23 0617) BP: (118-147)/(81-99) 124/85 (06/23 0617) SpO2:  [98 %-100 %] 100 % (06/23 0617) Last BM Date: 05/30/19 General:    white female in NAD Heart:  Regular rate and rhythm; no murmurs Lungs: Respirations even and unlabored, lungs CTA bilaterally Abdomen:  Soft, nontender and nondistended. Normal bowel sounds. Extremities:  Without edema. Neurologic:  Alert and oriented,  grossly normal neurologically. Psych:  Cooperative. Normal mood and affect.  Intake/Output from previous day: 06/22 0701 - 06/23 0700 In: 2423 [P.O.:720; I.V.:1403; IV Piggyback:300] Out: 900 [Urine:900] Intake/Output this shift: Total I/O In: 360 [P.O.:360] Out: 600 [Urine:600]  Lab Results: Recent Labs    05/31/19 0217 06/01/19 0227 06/02/19 0824  WBC 19.1* 17.3* 11.2*  HGB 15.3* 15.0 14.6  HCT 47.3* 45.1 43.6  PLT 344 289 298   BMET Recent Labs    05/31/19 0217 06/01/19 0227 06/02/19 0824  NA 136 136 135  K 3.5 3.1* 3.3*  CL 98 106 103  CO2 23 22 21*  GLUCOSE 241* 88 97  BUN 7 7 5*  CREATININE 0.51 0.54 0.51  CALCIUM 8.5* 7.9* 8.1*   LFT Recent Labs    05/31/19 0212  PROT 7.0  ALBUMIN 3.8  AST 12*  ALT 12  ALKPHOS 80  BILITOT 0.6  BILIDIR 0.1  IBILI 0.5   PT/INR No results for input(s): LABPROT, INR in the last 72 hours.  Studies/Results: Dg Abd Portable 1v  Result Date: 06/01/2019 CLINICAL DATA:  Abdominal pain. EXAM: PORTABLE ABDOMEN - 1 VIEW COMPARISON:  CT scan of May 31, 2019.  Radiograph of May 30, 2019. FINDINGS: The bowel gas pattern is normal. No radio-opaque  calculi or other significant radiographic abnormality are seen. IMPRESSION: No evidence of bowel obstruction or ileus. Electronically Signed   By: Marijo Conception M.D.   On: 06/01/2019 07:26       Assessment / Plan:    #58  58 year old white female admitted with nausea vomiting and abdominal pain and found to be in DKA.  Patient much improved, DKA resolved. Likely acute gastroparesis in setting of DKA CT of the abdomen and pelvis on admission showed new wall thickening of the lower esophagus, stomach, duodenum and proximal jejunum rule out infectious versus inflammatory. Leukocytosis is resolving, she has been on empiric Unasyn. GI path panel ordered but no diarrhea.   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 after midnight Continue IV PPI twice daily today Will convert Reglan to oral .  Patient has been scheduled for upper endoscopy and enteroscopy with Dr. Lyndel Safe tomorrow 06/03/2019 to further assess CT findings.  Procedure was discussed in detail with the patient including indications risks and benefits and she is agreeable to proceed.         Principal Problem:   DKA (diabetic ketoacidoses) ([name]) Active Problems:   Hypothyroidism   Generalized anxiety disorder   Gastroparesis due to  DM ([name])   Nausea and vomiting   Uncontrolled diabetes mellitus with hyperosmolarity, with long-term current use of insulin ([place])   Uncontrolled diabetes mellitus ([place])     LOS: 2 days   Amy EsterwoodPA-C  06/02/2019, 10:41 AM     Attending physician's note   I have taken an interval history, reviewed the chart and examined the patient. I agree with the Advanced Practitioner's note, impression and recommendations.   No further nausea/vomiting/abdominal pain.  Tolerating liquids well. CT abdo/pelvis-thickening of  esophagus, stomach, duodenum and proximal jejunum-inflammatory versus infectious.  Plan: -Advanced diet. -Enteroscopy in a.m. -Continue current medications. Switch to oral meds. -May need solid-phase gastric emptying scan as outpatient. -Advised to stop all marijuana. -Anticipate discharge tomorrow evening or day after depending upon endo findings.   Carmell Austria, MD [facility] [phone].

## 2019-06-02 NOTE — Progress Notes (Addendum)
Patient ID: [name] [name], female   DOB: 03-16-1961, 58 y.o.   MRN: [id number]    Progress Note   Subjective   Continues to improve- no c/o abd pain,or vomiting- tolerated liquids yesterday  WBC down to 11.2  On Unasyn    Objective   Vital signs in last 24 hours: Temp:  [98 F (36.7 C)-98.6 F (37 C)] 98.6 F (37 C) (06/23 0617) Pulse Rate:  [80-106] 80 (06/23 0617) Resp:  [12-21] 18 (06/23 0617) BP: (118-147)/(81-99) 124/85 (06/23 0617) SpO2:  [98 %-100 %] 100 % (06/23 0617) Last BM Date: 05/30/19 General:    white female in NAD Heart:  Regular rate and rhythm; no murmurs Lungs: Respirations even and unlabored, lungs CTA bilaterally Abdomen:  Soft, nontender and nondistended. Normal bowel sounds. Extremities:  Without edema. Neurologic:  Alert and oriented,  grossly normal neurologically. Psych:  Cooperative. Normal mood and affect.  Intake/Output from previous day: 06/22 0701 - 06/23 0700 In: 2423 [P.O.:720; I.V.:1403; IV Piggyback:300] Out: 900 [Urine:900] Intake/Output this shift: Total I/O In: 360 [P.O.:360] Out: 600 [Urine:600]  Lab Results: Recent Labs    05/31/19 0217 06/01/19 0227 06/02/19 0824  WBC 19.1* 17.3* 11.2*  HGB 15.3* 15.0 14.6  HCT 47.3* 45.1 43.6  PLT 344 289 298   BMET Recent Labs    05/31/19 0217 06/01/19 0227 06/02/19 0824  NA 136 136 135  K 3.5 3.1* 3.3*  CL 98 106 103  CO2 23 22 21*  GLUCOSE 241* 88 97  BUN 7 7 5*  CREATININE 0.51 0.54 0.51  CALCIUM 8.5* 7.9* 8.1*   LFT Recent Labs    05/31/19 0212  PROT 7.0  ALBUMIN 3.8  AST 12*  ALT 12  ALKPHOS 80  BILITOT 0.6  BILIDIR 0.1  IBILI 0.5   PT/INR No results for input(s): LABPROT, INR in the last 72 hours.  Studies/Results: Dg Abd Portable 1v  Result Date: 06/01/2019 CLINICAL DATA:  Abdominal pain. EXAM: PORTABLE ABDOMEN - 1 VIEW COMPARISON:  CT scan of May 31, 2019.  Radiograph of May 30, 2019. FINDINGS: The bowel gas pattern is normal. No radio-opaque  calculi or other significant radiographic abnormality are seen. IMPRESSION: No evidence of bowel obstruction or ileus. Electronically Signed   By: Marijo Conception M.D.   On: 06/01/2019 07:26       Assessment / Plan:    #74  58 year old white female admitted with nausea vomiting and abdominal pain and found to be in DKA.  Patient much improved, DKA resolved. Likely acute gastroparesis in setting of DKA CT of the abdomen and pelvis on admission showed new wall thickening of the lower esophagus, stomach, duodenum and proximal jejunum rule out infectious versus inflammatory. Leukocytosis is resolving, she has been on empiric Unasyn. GI path panel ordered but no diarrhea.   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 after midnight Continue IV PPI twice daily today Will convert Reglan to oral .  Patient has been scheduled for upper endoscopy and enteroscopy with Dr. Lyndel Safe tomorrow 06/03/2019 to further assess CT findings.  Procedure was discussed in detail with the patient including indications risks and benefits and she is agreeable to proceed.         Principal Problem:   DKA (diabetic ketoacidoses) ([name]) Active Problems:   Hypothyroidism   Generalized anxiety disorder   Gastroparesis due to  DM ([name])   Nausea and vomiting   Uncontrolled diabetes mellitus with hyperosmolarity, with long-term current use of insulin ([place])   Uncontrolled diabetes mellitus ([place])     LOS: 2 days   Amy EsterwoodPA-C  06/02/2019, 10:41 AM     Attending physician's note   I have taken an interval history, reviewed the chart and examined the patient. I agree with the Advanced Practitioner's note, impression and recommendations.   No further nausea/vomiting/abdominal pain.  Tolerating liquids well. CT abdo/pelvis-thickening of  esophagus, stomach, duodenum and proximal jejunum-inflammatory versus infectious.  Plan: -Advanced diet. -Enteroscopy in a.m. -Continue current medications. Switch to oral meds. -May need solid-phase gastric emptying scan as outpatient. -Advised to stop all marijuana. -Anticipate discharge tomorrow evening or day after depending upon endo findings.   Carmell Austria, MD [facility] [phone].

## 2019-06-03 ENCOUNTER — Inpatient Hospital Stay (HOSPITAL_COMMUNITY): Payer: 59 | Admitting: Anesthesiology

## 2019-06-03 ENCOUNTER — Encounter (HOSPITAL_COMMUNITY): Payer: Self-pay | Admitting: *Deleted

## 2019-06-03 ENCOUNTER — Encounter (HOSPITAL_COMMUNITY)
Admission: EM | Disposition: A | Discharge status: Home / Self Care | Payer: Self-pay | Source: Home / Self Care | Attending: Internal Medicine

## 2019-06-03 DIAGNOSIS — K209 Esophagitis, unspecified without bleeding: Secondary | ICD-10-CM

## 2019-06-03 DIAGNOSIS — K269 Duodenal ulcer, unspecified as acute or chronic, without hemorrhage or perforation: Secondary | ICD-10-CM

## 2019-06-03 HISTORY — PX: BIOPSY: SHX5522

## 2019-06-03 HISTORY — PX: ENTEROSCOPY: SHX5533

## 2019-06-03 LAB — CBC
HCT: 41.7 % (ref 36.0–46.0)
Hemoglobin: 14.1 g/dL (ref 12.0–15.0)
MCH: 28.6 pg (ref 26.0–34.0)
MCHC: 33.8 g/dL (ref 30.0–36.0)
MCV: 84.6 fL (ref 80.0–100.0)
Platelets: 289 10*3/uL (ref 150–400)
RBC: 4.93 MIL/uL (ref 3.87–5.11)
RDW: 15.1 % (ref 11.5–15.5)
WBC: 10.8 10*3/uL — ABNORMAL HIGH (ref 4.0–10.5)
nRBC: 0 % (ref 0.0–0.2)

## 2019-06-03 LAB — BASIC METABOLIC PANEL
Anion gap: 10 (ref 5–15)
BUN: 6 mg/dL (ref 6–20)
CO2: 23 mmol/L (ref 22–32)
Calcium: 8.3 mg/dL — ABNORMAL LOW (ref 8.9–10.3)
Chloride: 104 mmol/L (ref 98–111)
Creatinine, Ser: 0.58 mg/dL (ref 0.44–1.00)
GFR calc Af Amer: >60 mL/min (ref >60)
GFR calc non Af Amer: >60 mL/min (ref >60)
Glucose, Bld: 191 mg/dL — ABNORMAL HIGH (ref 70–99)
Potassium: 3.7 mmol/L (ref 3.5–5.1)
Sodium: 137 mmol/L (ref 135–145)

## 2019-06-03 LAB — GLUCOSE, CAPILLARY
Glucose-Capillary: 192 mg/dL — ABNORMAL HIGH (ref 70–99)
Glucose-Capillary: 102 mg/dL — ABNORMAL HIGH (ref 70–99)
Glucose-Capillary: 122 mg/dL — ABNORMAL HIGH (ref 70–99)

## 2019-06-03 SURGERY — ENTEROSCOPY
Anesthesia: Monitor Anesthesia Care

## 2019-06-03 MED ORDER — PROPOFOL 10 MG/ML IV BOLUS
INTRAVENOUS | Status: AC
  Filled 2019-06-03: qty 40

## 2019-06-03 MED ORDER — PANTOPRAZOLE SODIUM 40 MG PO TBEC: 40.0000 mg | DELAYED_RELEASE_TABLET | Freq: Two times a day (BID) | ORAL | 11 refills | Status: DC

## 2019-06-03 MED ORDER — PANTOPRAZOLE SODIUM 40 MG PO TBEC
40.0000 mg | DELAYED_RELEASE_TABLET | Freq: Two times a day (BID) | ORAL | Status: DC
  Filled 2019-06-03: qty 1

## 2019-06-03 MED ORDER — PROPOFOL 500 MG/50ML IV EMUL
INTRAVENOUS | Status: DC | PRN
  Administered 2019-06-03: 125 ug/kg/min via INTRAVENOUS

## 2019-06-03 MED ORDER — AMOXICILLIN-POT CLAVULANATE 875-125 MG PO TABS: 1.0000 | ORAL_TABLET | Freq: Two times a day (BID) | ORAL | 0 refills | Status: AC

## 2019-06-03 MED ORDER — LIDOCAINE HCL (CARDIAC) PF 100 MG/5ML IV SOSY
PREFILLED_SYRINGE | INTRAVENOUS | Status: DC | PRN
  Administered 2019-06-03: 100 mg via INTRAVENOUS

## 2019-06-03 MED ORDER — PROPOFOL 10 MG/ML IV BOLUS
INTRAVENOUS | Status: DC | PRN
  Administered 2019-06-03: 20 mg via INTRAVENOUS

## 2019-06-03 MED ORDER — LACTATED RINGERS IV SOLN
INTRAVENOUS | Status: DC
  Administered 2019-06-03: 1000 mL via INTRAVENOUS

## 2019-06-03 SURGICAL SUPPLY — 15 items

## 2019-06-03 NOTE — Anesthesia Preprocedure Evaluation (Signed)
Anesthesia Evaluation  Patient identified by MRN, date of birth, ID band Patient awake    Reviewed: Allergy & Precautions, H&P , NPO status , Patient's Chart, lab work & pertinent test results, reviewed documented beta blocker date and time   History of Anesthesia Complications Negative for: history of anesthetic complications  Airway Mallampati: III  TM Distance: <3 FB Neck ROM: full    Dental  (+) Poor Dentition   Pulmonary former smoker,    Pulmonary exam normal breath sounds clear to auscultation       Cardiovascular Exercise Tolerance: Good hypertension, Pt. on home beta blockers and Pt. on medications Normal cardiovascular exam Rhythm:regular Rate:Normal  hyperlipidemia   Neuro/Psych  Headaches (every other month), Anxiety Depression    GI/Hepatic Neg liver ROS, GERD  Medicated and Controlled,  Endo/Other  diabetes, Type 2, Oral Hypoglycemic Agents, Insulin DependentHypothyroidism   Renal/GU negative Renal ROS     Musculoskeletal   Abdominal Normal abdominal exam  (+)   Peds  Hematology negative hematology ROS (+)   Anesthesia Other Findings   Reproductive/Obstetrics (+) Pregnancy                             Anesthesia Physical  Anesthesia Plan  ASA: II  Anesthesia Plan: General ETT and MAC   Post-op Pain Management:    Induction:   PONV Risk Score and Plan: 2 and Ondansetron  Airway Management Planned: Nasal Cannula, Natural Airway and Mask  Additional Equipment:   Intra-op Plan:   Post-operative Plan:   Informed Consent: I have reviewed the patients History and Physical, chart, labs and discussed the procedure including the risks, benefits and alternatives for the proposed anesthesia with the patient or authorized representative who has indicated his/her understanding and acceptance.     Dental Advisory Given  Plan Discussed with: Surgeon and CRNA  Anesthesia  Plan Comments:         Anesthesia Quick Evaluation

## 2019-06-03 NOTE — Discharge Summary (Signed)
Discharge Summary  [name] [name] CXK:[id number] DOB: [birth year]  PCP: [name] Sanders, MD  Admit date: 05/30/2019 Discharge date: 06/03/2019  Time spent: 39mins, more than 50% time spent on coordination of care.  Recommendations for Outpatient Follow-up:  1. F/u with PCP within a week  for hospital discharge follow up, repeat cbc/bmp at follow up. pcp to continue monitor blood glucose control 2. F/u with GI for biopsy result, esophagitis/duodenal ulcer and repeat egd Discharge meds changes: increase protonix to bid, hold asa, resume asa when oks with gi. augmentin for three days  Discharge Diagnoses:  Active Hospital Problems   Diagnosis Date Noted   DKA (diabetic ketoacidoses) ([place]) 09/23/2018   Enteritis    Uncontrolled diabetes mellitus ([place]) 05/31/2019   Uncontrolled diabetes mellitus with hyperosmolarity, with long-term current use of insulin ([place]) 05/30/2019   Nausea and vomiting 03/25/2019   Abdominal pain    Gastroparesis due to DM ([place]) 01/22/2017   Generalized anxiety disorder 06/22/2008   Hypothyroidism 04/29/2008    Resolved Hospital Problems  No resolved problems to display.    Discharge Condition: stable  Diet recommendation: heart healthy/carb modified, soft diet  Filed Weights   05/30/19 0206 05/30/19 0546 06/03/19 1346  Weight: 63.5 kg 61.7 kg 61.7 kg    History of present illness: (per admitting MD Dr Myna Hidalgo) PCP: [name] Sanders, MD   Patient coming from: Home   Chief Complaint: Hyperglycemia, N/V   HPI: [name] [name] is a 58 y.o. female with medical history significant for poorly controlled insulin-dependent diabetes mellitus, diabetic gastroparesis, hypothyroidism, hypertension, chronic leukocytosis, and depression, now presenting to emergency department for evaluation of hyperglycemia, nausea, and vomiting.  Patient reports that she had a big lunch at [facility] yesterday, developed some nausea shortly after this, and then went on to  develop worsening in her nausea with recurrent nonbloody vomiting and a loose stool.  She reports checking her glucose and finding it to be high.  States that she missed a dose of insulin earlier this week, but not in the past couple days.  She acknowledges nonadherence to her recommended diet.  She denies any fevers, chills, or abdominal pain.  She also denies chest pain, cough, or shortness of breath.  She describes her current symptoms as similar to her prior episodes of gastroparesis.  ED Course: Upon arrival to the ED, patient is found to be afebrile, saturating well on room air, and with stable blood pressure.  EKG features a sinus rhythm.  Chemistry panel is notable for glucose of 1 and 37, bicarbonate 18, and anion gap of 20.  CBC features a chronic stable leukocytosis, mild polycythemia, and mild thrombocytosis.  Urinalysis notable for glucosuria, ketonuria, and proteinuria.  Patient was given a liter of normal saline in the ED, supplemental potassium, Phenergan, and was started on insulin infusion.  Hospitalists are asked to admit.  Hospital Course:  Principal Problem:   DKA (diabetic ketoacidoses) ([name]) Active Problems:   Hypothyroidism   Generalized anxiety disorder   Gastroparesis due to DM ([name])   Abdominal pain   Nausea and vomiting   Uncontrolled diabetes mellitus with hyperosmolarity, with long-term current use of insulin ([name])   Uncontrolled diabetes mellitus ([name])   Enteritis   Uncontrolled Insulin dependent Diabetes, DKA: Patient presented with anion gap of 20, CO2 18, glucose 437. a1c 11.6 Treated with insulin gtt. Now on lantus. SSI UA negative for infection. chest x ray. Negative.  Close follow up with pcp for blood glucose control  Abdominal  Pain; nausea vomiting -CT scan negative for perforation, diverticulitis, did showed inflamation, vs infection esophagus, stomach, small bowel.  Started IV antibiotics, Unasyn, she does has leukocytosis on presentation, wbc has  improved on abx -she received IV PPI  -s/p EGD on  6-24 LA Grade D esophagitis. Biopsied. Normal stomach. Biopsied.  Non-bleeding duodenal ulcers with no stigmata of bleeding. Biopsied. -she is cleared to discharge home with ppi bid and hold asa, she is to close follow up with gi  -she denies ab pain, no n/v at discharge -she is discharged on augmentin for three more days to finish total of 7 day abx treatment to cover gi infection -continue reglan for gastroparesis   Hypokalemia: Replaced, normalized   Hypertension/HLD: Continue with losartan and metoprolol, statin Home meds lasix  prn due to tendency to have dehydration   Hypothyroidism: Continue with Synthroid.  Depression: Continue with Zoloft   Procedures:  EGD on 6/24  Consultations:  GI  Discharge Exam: BP 131/85 (BP Location: Right Arm)    Pulse 74    Temp 97.9 F (36.6 C) (Oral)    Resp 17    Ht 4\' 11"  (1.499 m)    Wt 61.7 kg    LMP 08/11/2012    SpO2 97%    BMI 27.47 kg/m   General: NAD Cardiovascular: RRR Respiratory: CTABL   Discharge Instructions You were cared for by a hospitalist during your hospital stay. If you have any questions about your discharge medications or the care you received while you were in the hospital after you are discharged, you can call the unit and asked to speak with the hospitalist on call if the hospitalist that took care of you is not available. Once you are discharged, your primary care physician will handle any further medical issues. Please note that NO REFILLS for any discharge medications will be authorized once you are discharged, as it is imperative that you return to your primary care physician (or establish a relationship with a primary care physician if you do not have one) for your aftercare needs so that they can reassess your need for medications and monitor your lab values.  Discharge Instructions    Diet - low sodium heart healthy   Complete by: As directed     Carb modified, soft diet   Increase activity slowly   Complete by: As directed      Allergies as of 06/03/2019      Reactions   Codeine Hives, Anxiety, Other (See Comments)      Medication List    STOP taking these medications   aspirin 81 MG chewable tablet     TAKE these medications   acetaminophen 325 MG tablet Commonly known as: TYLENOL Take 650 mg by mouth every 6 (six) hours as needed for mild pain, fever or headache.   amoxicillin-clavulanate 875-125 MG tablet Commonly known as: Augmentin Take 1 tablet by mouth 2 (two) times daily for 3 days.   fexofenadine 180 MG tablet Commonly known as: ALLEGRA Take 1 tablet (180 mg total) by mouth daily.   FreeStyle Libre 14 Day Sensor Misc by Does not apply route.   furosemide 20 MG tablet Commonly known as: LASIX Take 1 tablet (20 mg total) by mouth as needed for fluid.   insulin lispro 100 UNIT/ML KiwkPen Commonly known as: HumaLOG KwikPen Inject into skin 3 times a day at meal time according to the following sliding scale: CBG 70-120 - 0 units / CBG 121-150 - 1 unit /  CBG 151-200 - 2 units / CBG 201-250 - 3 units / CBG 251-300 - 5 units / CBG 301-350 - 7 units / CBG 351-400+ - 9 units  YOU MUST FOLLOW A STRICT DIABETIC DIET What changed:   how to take this  when to take this  additional instructions   levothyroxine 150 MCG tablet Commonly known as: SYNTHROID TAKE 1 TABLET BY MOUTH EVERY DAY What changed: when to take this   losartan 25 MG tablet Commonly known as: COZAAR TAKE 1 TABLET BY MOUTH EVERY DAY   metoCLOPramide 5 MG tablet Commonly known as: Reglan Take 5mg  before breakfast lunch and dinner and 10mg  at bedtime   metoprolol succinate 25 MG 24 hr tablet Commonly known as: TOPROL-XL TAKE 1 TABLET BY MOUTH EVERY DAY   nitroGLYCERIN 0.2 mg/hr patch Commonly known as: NITRODUR - Dosed in mg/24 hr APPLY 1 PATCH TO SKIN EVERY DAY AS NEEDED What changed: See the new instructions.   onetouch  ultrasoft lancets Use to check blood sugar daily as needed.  Dx: E11.10   OneTouch Verio test strip Generic drug: glucose blood USE TO CHECK BLOOD SUGAR DAILY AS NEEDED. DX: E11.10   pantoprazole 40 MG tablet Commonly known as: PROTONIX Take 1 tablet (40 mg total) by mouth 2 (two) times daily before a meal. What changed: when to take this   Pen Needles 31G X 8 MM Misc Use to inject insulin 4 times a day.  Dx: E11.10   promethazine 25 MG tablet Commonly known as: PHENERGAN TAKE 1 TABLET BY MOUTH EVERY 8 HOURS AS NEEDED FOR NAUSEA OR VOMITING. What changed:   how much to take  how to take this  when to take this  reasons to take this  additional instructions   rosuvastatin 40 MG tablet Commonly known as: CRESTOR Take 1 tablet (40 mg total) by mouth daily.   sertraline 50 MG tablet Commonly known as: ZOLOFT TAKE 1 TABLET BY MOUTH EVERY DAY What changed: how much to take   [place] FlexTouch 100 UNIT/ML Sopn FlexTouch Pen Generic drug: insulin degludec Inject 0.24 mLs (24 Units total) into the skin at bedtime. On 10/20/18 take at lunch time, on 10/21/18 take at dinner time. On 10/22/18 resume at bedtime regimen.      Allergies  Allergen Reactions   Codeine Hives, Anxiety and Other (See Comments)   Follow-up Information    Bedsole, Amy E, MD Follow up in 1 week(s).   Specialty: Family Medicine Why: hospital discharge follow up, repeat cbc/bmp at follow up, please work with your doctor for blood glucose control, goal of a1c less than 7%.  Contact information: [place] [address] [phone]        Milus Banister, MD Follow up in 1 week(s).   Specialty: Gastroenterology Why: f/u with biopsy result, f/u on esophagitis , duodenal ulcer, repeat egd in 12 weeks restart asprin when ok with your gi doctor. Contact information: [address] [place] [address] [phone]            The results of significant diagnostics  from this hospitalization (including imaging, microbiology, ancillary and laboratory) are listed below for reference.    Significant Diagnostic Studies: Dg Abd 1 View  Result Date: 05/30/2019 CLINICAL DATA:  Vomiting. EXAM: ABDOMEN - 1 VIEW COMPARISON:  03/25/2019 FINDINGS: The bowel gas pattern is normal. No radio-opaque calculi or other significant radiographic abnormality are seen. IMPRESSION: Negative. Electronically Signed   By: Fidela Salisbury  M.D.   On: 05/30/2019 17:28   Ct Abdomen Pelvis W Contrast  Result Date: 05/31/2019 CLINICAL DATA:  Inpatient. Acute abdominal pain, nausea, vomiting and diarrhea. EXAM: CT ABDOMEN AND PELVIS WITH CONTRAST TECHNIQUE: Multidetector CT imaging of the abdomen and pelvis was performed using the standard protocol following bolus administration of intravenous contrast. CONTRAST:  176mL OMNIPAQUE IOHEXOL 300 MG/ML  SOLN COMPARISON:  05/30/2019 abdominal radiograph. 11/26/2018 CT abdomen/pelvis. FINDINGS: Lower chest: No significant pulmonary nodules or acute consolidative airspace disease. Circumferential wall thickening in the lower thoracic esophagus appears new. Hepatobiliary: Normal liver size. No liver mass. Stable focal adenomyomatosis of the gallbladder fundus. No radiopaque cholelithiasis. No biliary ductal dilatation. Pancreas: Normal, with no mass or duct dilation. Spleen: Normal size. No mass. Adrenals/Urinary Tract: Normal adrenals. Normal kidneys with no hydronephrosis and no renal mass. Distended and otherwise normal bladder. Stomach/Bowel: There is generalized mild wall thickening and mucosal hyperenhancement throughout the stomach, most prominent in the gastric antrum. There is prominent wall thickening throughout the duodenum with mucosal hyperenhancement. There is wall thickening and mucosal hyperenhancement in the proximal jejunum. No significant small bowel dilatation or focal small bowel caliber transition. Normal appendix. Mild diffuse colonic  diverticulosis, with no large bowel wall thickening or significant pericolonic fat stranding. Vascular/Lymphatic: Mildly atherosclerotic nonaneurysmal abdominal aorta. Patent portal, splenic, hepatic and renal veins. No pathologically enlarged lymph nodes in the abdomen or pelvis. Reproductive: Status post hysterectomy, with no abnormal findings at the vaginal cuff. No adnexal mass. Other: No pneumoperitoneum, ascites or focal fluid collection. Small fat containing umbilical hernia is stable. Musculoskeletal: No aggressive appearing focal osseous lesions. Mild lumbar spondylosis. IMPRESSION: 1. New wall thickening and mucosal hyperenhancement throughout the visualized lower esophagus, stomach, duodenum and proximal jejunum, most compatible with a nonspecific infectious or inflammatory esophago-gastro-enteritis. 2. Focal adenomyomatosis of the gallbladder fundus, chronic. 3. Mild colonic diverticulosis. No evidence of acute diverticulitis. 4.  Aortic Atherosclerosis (ICD10-I70.0). Electronically Signed   By: Ilona Sorrel M.D.   On: 05/31/2019 09:08   Dg Chest Port 1 View  Result Date: 05/30/2019 CLINICAL DATA:  Leukocytosis EXAM: PORTABLE CHEST 1 VIEW COMPARISON:  03/25/2019 chest radiograph. FINDINGS: Stable cardiomediastinal silhouette with normal heart size. No pneumothorax. No pleural effusion. Lungs appear clear, with no acute consolidative airspace disease and no pulmonary edema. IMPRESSION: No active disease. Electronically Signed   By: Ilona Sorrel M.D.   On: 05/30/2019 09:31   Dg Abd Portable 1v  Result Date: 06/01/2019 CLINICAL DATA:  Abdominal pain. EXAM: PORTABLE ABDOMEN - 1 VIEW COMPARISON:  CT scan of May 31, 2019.  Radiograph of May 30, 2019. FINDINGS: The bowel gas pattern is normal. No radio-opaque calculi or other significant radiographic abnormality are seen. IMPRESSION: No evidence of bowel obstruction or ileus. Electronically Signed   By: Marijo Conception M.D.   On: 06/01/2019 07:26     Microbiology: Recent Results (from the past 240 hour(s))  SARS Coronavirus 2 (CEPHEID - Performed in [facility] hospital lab), Hosp Order     Status: None   Collection Time: 05/30/19  4:55 AM   Specimen: Nasopharyngeal Swab  Result Value Ref Range Status   SARS Coronavirus 2 NEGATIVE NEGATIVE Final    Comment: (NOTE) If result is NEGATIVE SARS-CoV-2 target nucleic acids are NOT DETECTED. The SARS-CoV-2 RNA is generally detectable in upper and lower  respiratory specimens during the acute phase of infection. The lowest  concentration of SARS-CoV-2 viral copies this assay can detect is 250  copies / mL. A  negative result does not preclude SARS-CoV-2 infection  and should not be used as the sole basis for treatment or other  patient management decisions.  A negative result may occur with  improper specimen collection / handling, submission of specimen other  than nasopharyngeal swab, presence of viral mutation(s) within the  areas targeted by this assay, and inadequate number of viral copies  (<250 copies / mL). A negative result must be combined with clinical  observations, patient history, and epidemiological information. If result is POSITIVE SARS-CoV-2 target nucleic acids are DETECTED. The SARS-CoV-2 RNA is generally detectable in upper and lower  respiratory specimens dur ing the acute phase of infection.  Positive  results are indicative of active infection with SARS-CoV-2.  Clinical  correlation with patient history and other diagnostic information is  necessary to determine patient infection status.  Positive results do  not rule out bacterial infection or co-infection with other viruses. If result is PRESUMPTIVE POSTIVE SARS-CoV-2 nucleic acids MAY BE PRESENT.   A presumptive positive result was obtained on the submitted specimen  and confirmed on repeat testing.  While 2019 novel coronavirus  (SARS-CoV-2) nucleic acids may be present in the submitted sample   additional confirmatory testing may be necessary for epidemiological  and / or clinical management purposes  to differentiate between  SARS-CoV-2 and other Sarbecovirus currently known to infect humans.  If clinically indicated additional testing with an alternate test  methodology [phone]) is advised. The SARS-CoV-2 RNA is generally  detectable in upper and lower respiratory sp ecimens during the acute  phase of infection. The expected result is Negative. Fact Sheet for Patients:  StrictlyIdeas.no Fact Sheet for Healthcare Providers: BankingDealers.co.za This test is not yet approved or cleared by the [place] FDA and has been authorized for detection and/or diagnosis of SARS-CoV-2 by FDA under an Emergency Use Authorization (EUA).  This EUA will remain in effect (meaning this test can be used) for the duration of the COVID-19 declaration under Section 564(b)(1) of the Act, 21 U.S.C. section 360bbb-3(b)(1), unless the authorization is terminated or revoked sooner. Performed at [facility], [place] [address], [place], [place] [address]   MRSA PCR Screening     Status: None   Collection Time: 05/30/19  6:05 AM   Specimen: Nasal Swab; Nasopharyngeal  Result Value Ref Range Status   MRSA by PCR NEGATIVE NEGATIVE Final    Comment:        The GeneXpert MRSA Assay (FDA approved for NASAL specimens only), is one component of a comprehensive MRSA colonization surveillance program. It is not intended to diagnose MRSA infection nor to guide or monitor treatment for MRSA infections. Performed at [facility], [place] [address], [place], [place] [address]      Labs: Basic Metabolic Panel: Recent Labs  Lab 05/30/19 1129 05/31/19 0217 06/01/19 0227 06/02/19 0824 06/03/19 0329  NA 140 136 136 135 137  K 3.0* 3.5 3.1* 3.3* 3.7  CL 103 98 106 103 104  CO2 24 23 22  21* 23  GLUCOSE 166* 241* 88  97 191*  BUN 11 7 7  5* 6  CREATININE 0.50 0.51 0.54 0.51 0.58  CALCIUM 8.7* 8.5* 7.9* 8.1* 8.3*   Liver Function Tests: Recent Labs  Lab 05/31/19 0212  AST 12*  ALT 12  ALKPHOS 80  BILITOT 0.6  PROT 7.0  ALBUMIN 3.8   Recent Labs  Lab 05/31/19 0212  LIPASE 40   No results for input(s): AMMONIA in the last  168 hours. CBC: Recent Labs  Lab 05/30/19 0220 05/30/19 0324 05/31/19 0217 06/01/19 0227 06/02/19 0824 06/03/19 0329  WBC 18.5*  --  19.1* 17.3* 11.2* 10.8*  NEUTROABS  --   --  15.6*  --   --   --   HGB 16.9* 17.0* 15.3* 15.0 14.6 14.1  HCT 48.7* 50.0* 47.3* 45.1 43.6 41.7  MCV 82.7  --  84.8 85.7 85.3 84.6  PLT 430*  --  344 289 298 289   Cardiac Enzymes: No results for input(s): CKTOTAL, CKMB, CKMBINDEX, TROPONINI in the last 168 hours. BNP: BNP (last 3 results) No results for input(s): BNP in the last 8760 hours.  ProBNP (last 3 results) No results for input(s): PROBNP in the last 8760 hours.  CBG: Recent Labs  Lab 06/02/19 1201 06/02/19 1750 06/02/19 2129 06/03/19 0726 06/03/19 1116  GLUCAP 119* 278* 125* 192* 102*       Signed:  Florencia Reasons MD, PhD  [facility] 06/03/2019, 4:29 PM

## 2019-06-03 NOTE — Progress Notes (Signed)
Discharge instructions given to pt and all questions were answered. Pt taken down via wheelchair and was picked up by her husband.  

## 2019-06-03 NOTE — Interval H&P Note (Signed)
History and Physical Interval Note:  06/03/2019 2:18 PM  [name] [name]  has presented today for surgery, with the diagnosis of abnormal CT.  The various methods of treatment have been discussed with the patient and family. After consideration of risks, benefits and other options for treatment, the patient has consented to  Procedure(s): ESOPHAGOGASTRODUODENOSCOPY (EGD) WITH PROPOFOL (N/A) ENTEROSCOPY (N/A) as a surgical intervention.  The patient's history has been reviewed, patient examined, no change in status, stable for surgery.  I have reviewed the patient's chart and labs.  Questions were answered to the patient's satisfaction.     Jackquline Denmark

## 2019-06-03 NOTE — Transfer of Care (Signed)
Immediate Anesthesia Transfer of Care Note  Patient: [name] [name]  Procedure(s) Performed: ENTEROSCOPY (N/A ) BIOPSY  Patient Location: Endoscopy Unit  Anesthesia Type:MAC  Level of Consciousness: awake, alert , oriented and patient cooperative  Airway & Oxygen Therapy: Patient Spontanous Breathing and Patient connected to nasal cannula oxygen  Post-op Assessment: Report given to RN, Post -op Vital signs reviewed and stable and Patient moving all extremities  Post vital signs: Reviewed and stable  Last Vitals:  Vitals Value Taken Time  BP    Temp    Pulse    Resp    SpO2      Last Pain:  Vitals:   06/03/19 1346  TempSrc: Oral  PainSc: 0-No pain         Complications: No apparent anesthesia complications

## 2019-06-03 NOTE — Anesthesia Postprocedure Evaluation (Signed)
Anesthesia Post Note  Patient: [name] [name]  Procedure(s) Performed: ENTEROSCOPY (N/A ) BIOPSY     Patient location during evaluation: Endoscopy Anesthesia Type: MAC Level of consciousness: awake and sedated Pain management: pain level controlled Vital Signs Assessment: post-procedure vital signs reviewed and stable Respiratory status: spontaneous breathing Cardiovascular status: stable Postop Assessment: no apparent nausea or vomiting Anesthetic complications: no    Last Vitals:  Vitals:   06/03/19 1346 06/03/19 1508  BP: 118/88 (!) 167/89  Pulse: 74 86  Resp: 13 18  Temp: 37.1 C 36.5 C  SpO2: 97% 100%    Last Pain:  Vitals:   06/03/19 1508  TempSrc: Temporal  PainSc: 0-No pain   Pain Goal:                   Huston Foley

## 2019-06-03 NOTE — Op Note (Addendum)
[facility] Patient Name: [name] [name] Procedure Date: 06/03/2019 MRN: [id number] Attending MD: Jackquline Denmark , MD Date of Birth: February 27, 1961 CSN: [id number] Age: 58 Admit Type: Inpatient Procedure:                Small bowel enteroscopy Indications:              Abnormal CT of the GI tract showing thickening of                            the esophagus, duodenum and jejunum. Providers:                Jackquline Denmark, MD, Cleda Daub, RN, Cherylynn Ridges,                            Technician, Stephanie [place] CRNA Referring MD:              Medicines:                Monitored Anesthesia Care Complications:            No immediate complications. Estimated Blood Loss:     Estimated blood loss: none. Procedure:                Pre-Anesthesia Assessment:                           - Prior to the procedure, a History and Physical                            was performed, and patient medications and                            allergies were reviewed. The patient's tolerance of                            previous anesthesia was also reviewed. The risks                            and benefits of the procedure and the sedation                            options and risks were discussed with the patient.                            All questions were answered, and informed consent                            was obtained. Prior Anticoagulants: The patient has                            taken no previous anticoagulant or antiplatelet                            agents. ASA Grade Assessment: II - A patient with  mild systemic disease. After reviewing the risks                            and benefits, the patient was deemed in                            satisfactory condition to undergo the procedure.                           After obtaining informed consent, the endoscope was                            passed under direct vision. Throughout the             procedure, the patient's blood pressure, pulse, and                            oxygen saturations were monitored continuously. The                            PCF-H190DL ([id number]) Olympus pediatric colonscope                            was introduced through the mouth and advanced to                            the mid-jejunum. The small bowel enteroscopy was                            accomplished without difficulty. The patient                            tolerated the procedure well. Scope In: Scope Out: Findings:      LA Grade D (one or more mucosal breaks involving at least 75% of       esophageal circumference) esophagitis with no bleeding was found 33 to       35 cm from the incisors. Biopsies were taken with a cold forceps for       histology. Estimated blood loss: none.      The entire examined stomach was normal. Biopsies were taken with a cold       forceps for Helicobacter pylori testing. Estimated blood loss: none.      Many non-bleeding superficial duodenal ulcers with no stigmata of       bleeding were found in the second portion of the duodenum. The largest       lesion was 10 mm in largest dimension. Biopsies were taken with a cold       forceps for histology. Estimated blood loss: none.      Visualized jejunum was normal. Impression:               - LA Grade D esophagitis. Biopsied.                           - Normal stomach. Biopsied.                           -  Non-bleeding duodenal ulcers with no stigmata of                            bleeding. Biopsied.                           - The examined portion of the jejunum was normal. Moderate Sedation:      Not Applicable - Patient had care per Anesthesia. Recommendation:           - Discharge patient to home.                           - Use Protonix (pantoprazole) 40 mg PO BID for 12                            weeks. Thereafter, continue p.o. once a day.                           - Take pills in sitting/standing  position.                           - Avoid nonsteroidals for now.                           - Return to GI clinic in 8 weeks.                           - Repeat EGD in 12 weeks to check healing and                            further evaluation of the esophagus/duodenum. Procedure Code(s):        --- Professional ---                           [phone], Small intestinal endoscopy, enteroscopy                            beyond second portion of duodenum, not including                            ileum; with biopsy, single or multiple Diagnosis Code(s):        --- Professional ---                           K20.9, Esophagitis, unspecified                           K26.9, Duodenal ulcer, unspecified as acute or                            chronic, without hemorrhage or perforation                           R93.3, Abnormal findings on diagnostic imaging of  other parts of digestive tract CPT copyright 2019 American Medical Association. All rights reserved. The codes documented in this report are preliminary and upon coder review may  be revised to meet current compliance requirements. Jackquline Denmark, MD 06/03/2019 3:09:45 PM This report has been signed electronically. Number of Addenda: 0

## 2019-06-03 NOTE — Progress Notes (Signed)
Patient ID: [name] [name], female   DOB: 03/28/1961, 58 y.o.   MRN: [id number]    Progress Note   Subjective    Doing well, no c/o pain or nausea- hoping to go home later today after procedures   Objective   Vital signs in last 24 hours: Temp:  [98 F (36.7 C)-99.1 F (37.3 C)] 98 F (36.7 C) (06/24 0729) Pulse Rate:  [75-86] 75 (06/24 0729) Resp:  [16-18] 16 (06/24 0729) BP: (118-131)/(80-83) 118/82 (06/24 0729) SpO2:  [98 %-99 %] 99 % (06/24 0729) Last BM Date: 05/29/19 General:    white female in NAD Heart:  Regular rate and rhythm; no murmurs Lungs: Respirations even and unlabored, lungs CTA bilaterally Abdomen:  Soft, nontender and nondistended. Normal bowel sounds. Extremities:  Without edema. Neurologic:  Alert and oriented,  grossly normal neurologically. Psych:  Cooperative. Normal mood and affect.  Intake/Output from previous day: 06/23 0701 - 06/24 0700 In: 3170.1 [P.O.:1560; I.V.:1310.1; IV Piggyback:300] Out: 4200 [Urine:4200] Intake/Output this shift: No intake/output data recorded.  Lab Results: Recent Labs    06/01/19 0227 06/02/19 0824 06/03/19 0329  WBC 17.3* 11.2* 10.8*  HGB 15.0 14.6 14.1  HCT 45.1 43.6 41.7  PLT 289 298 289   BMET Recent Labs    06/01/19 0227 06/02/19 0824 06/03/19 0329  NA 136 135 137  K 3.1* 3.3* 3.7  CL 106 103 104  CO2 22 21* 23  GLUCOSE 88 97 191*  BUN 7 5* 6  CREATININE 0.54 0.51 0.58  CALCIUM 7.9* 8.1* 8.3*   LFT No results for input(s): PROT, ALBUMIN, AST, ALT, ALKPHOS, BILITOT, BILIDIR, IBILI in the last 72 hours. PT/INR No results for input(s): LABPROT, INR in the last 72 hours.       Assessment / Plan:    #49  58 year old white female admitted with abdominal pain nausea and vomiting and found to be in DKA. DKA has resolved, and patient currently without complaint of pain or nausea she has been tolerating solid diet.  CT of the abdomen and pelvis on admission showed new wall thickening of the  lower esophagus stomach, duodenum and proximal jejunum for an inflammatory versus infectious. Leukocytosis has improved. Patient has been covered with empiric Unasyn  #2 hypokalemia resolved #3 congestive heart failure-stable  Plan; patient is scheduled for upper endoscopy and enteroscopy today with Dr. Lyndel Safe to further evaluate abnormal CT. Hopefully she can be discharged home this afternoon after procedures. Would continue Protonix 40 mg p.o. every morning, and low-dose Reglan 5 mg p.o. 1/2-hour AC at discharge.    Principal Problem:   DKA (diabetic ketoacidoses) ([name]) Active Problems:   Hypothyroidism   Generalized anxiety disorder   Gastroparesis due to DM ([name])   Abdominal pain   Nausea and vomiting   Uncontrolled diabetes mellitus with hyperosmolarity, with long-term current use of insulin ([place])   Uncontrolled diabetes mellitus ([place])   Enteritis     LOS: 3 days   Peachie Barkalow PA-C 06/03/2019, 9:23 AM

## 2019-06-04 ENCOUNTER — Encounter (HOSPITAL_COMMUNITY): Payer: Self-pay | Admitting: Gastroenterology

## 2019-06-04 ENCOUNTER — Ambulatory Visit (HOSPITAL_COMMUNITY): Payer: 59

## 2019-06-05 ENCOUNTER — Other Ambulatory Visit: Payer: Self-pay

## 2019-06-05 DIAGNOSIS — E1010 Type 1 diabetes mellitus with ketoacidosis without coma: Secondary | ICD-10-CM

## 2019-06-08 ENCOUNTER — Other Ambulatory Visit: Payer: Self-pay | Admitting: *Deleted

## 2019-06-08 NOTE — Patient Outreach (Signed)
[place] [facility] Care Management  06/08/2019  [name] [name] Apr 30, 1961 [id number]   Referral received from hospital liaison as member was recently admitted to hospital 6/20-6/24 for DKA, A1C - 11.6 as of 03/25/2019.  Primary MD office will complete transition of care assessment.  Per chart, she also has history of hypertension, atrial fibrillation, NSTEMI, CHF, GERD, and hypothyroidism.  Call placed to member for telephone assessment, unsuccessful.  HIPAA compliant voice message left.  Unsuccessful outreach letter sent.  Will follow up with member within the next 3-4 business days.  [name], [place], MSN [place] [phone]

## 2019-06-10 ENCOUNTER — Ambulatory Visit (HOSPITAL_BASED_OUTPATIENT_CLINIC_OR_DEPARTMENT_OTHER): Payer: 59 | Attending: Cardiology

## 2019-06-10 ENCOUNTER — Encounter: Payer: Self-pay | Admitting: Gastroenterology

## 2019-06-11 ENCOUNTER — Other Ambulatory Visit: Payer: Self-pay | Admitting: *Deleted

## 2019-06-11 NOTE — Patient Outreach (Signed)
[place] [facility]) Care Management  06/11/2019  KEARRA CALKIN 05-04-1961 [id number]   Second attempt made to contact member post discharge for telephone assessment, unsuccessful.  Unable to leave voice message, will follow up within the next 3-4 business days.  [name], [place], MSN [place] [phone]

## 2019-06-13 ENCOUNTER — Other Ambulatory Visit: Payer: Self-pay | Admitting: Family Medicine

## 2019-06-15 NOTE — Telephone Encounter (Signed)
Last office visit 04/28/2019 for hospital follow up.  Sertraline was increased to 100 mg daily with close follow up in one month.  CPE scheduled for 07/21/2019.  Ok to refill.  If so, send in 100 mg one tablet daily or 50 mg two tablets daily?  Medication list has sertraline 50 mg one daily.

## 2019-06-17 ENCOUNTER — Other Ambulatory Visit: Payer: Self-pay | Admitting: *Deleted

## 2019-06-17 NOTE — Patient Outreach (Signed)
[place] [facility]) Care Management  06/17/2019  [name] [name] 05/12/1961 [id number]   Unsuccessful outreach attempt for post discharge telephone assessment.  HIPAA compliant message left via recorded Google screening device.  Will await call back.  If no call back by 7/13 will close case due to inability to establish contact as this is third consecutive unsuccessful attempt.  [name], [place], MSN [place] [phone]

## 2019-06-22 ENCOUNTER — Other Ambulatory Visit: Payer: Self-pay | Admitting: *Deleted

## 2019-06-22 NOTE — Patient Outreach (Signed)
[place] [facility]) Care Management  06/22/2019  [name] [name] 01/29/1961 [id number]   No response from member after multiple unsuccessful outreach attempts and letter sent.  Will close case at this time due to inability to maintain contact.  Will notify member and primary MD of case closure.   [name], [place], MSN [place] [phone]

## 2019-06-27 ENCOUNTER — Other Ambulatory Visit: Payer: Self-pay | Admitting: Cardiology

## 2019-07-02 ENCOUNTER — Other Ambulatory Visit: Payer: Self-pay | Admitting: Family Medicine

## 2019-07-03 NOTE — Telephone Encounter (Signed)
THESE MEDICAITONS ARE SIMILAR. WHICH ONE WORKS BEST FOR NAUSEA?Marland Kitchen. I WILL REFILL THAT ONE.

## 2019-07-03 NOTE — Telephone Encounter (Signed)
Last fill 04/01/19  #30/0 Last OV 04/28/19

## 2019-07-03 NOTE — Telephone Encounter (Signed)
I spoke with pt and she informed me that the Promethazine works better.

## 2019-07-03 NOTE — Telephone Encounter (Signed)
Sorry.. caps lock.

## 2019-07-10 ENCOUNTER — Other Ambulatory Visit: Payer: Self-pay | Admitting: Physician Assistant

## 2019-07-15 ENCOUNTER — Telehealth: Payer: Self-pay

## 2019-07-15 NOTE — Telephone Encounter (Signed)
Left detailed VM w COVID screen and back door lab info and front door Dr appt info

## 2019-07-16 ENCOUNTER — Telehealth: Payer: Self-pay | Admitting: Family Medicine

## 2019-07-16 DIAGNOSIS — E538 Deficiency of other specified B group vitamins: Secondary | ICD-10-CM

## 2019-07-16 DIAGNOSIS — K56609 Unspecified intestinal obstruction, unspecified as to partial versus complete obstruction: Secondary | ICD-10-CM

## 2019-07-16 DIAGNOSIS — Z1159 Encounter for screening for other viral diseases: Secondary | ICD-10-CM

## 2019-07-16 DIAGNOSIS — E1149 Type 2 diabetes mellitus with other diabetic neurological complication: Secondary | ICD-10-CM

## 2019-07-16 DIAGNOSIS — E039 Hypothyroidism, unspecified: Secondary | ICD-10-CM

## 2019-07-16 DIAGNOSIS — D72829 Elevated white blood cell count, unspecified: Secondary | ICD-10-CM

## 2019-07-16 NOTE — Telephone Encounter (Signed)
-----   Message from Cloyd Stagers, RT sent at 07/08/2019  2:36 PM EDT ----- Regarding: Lab Orders for Friday 8.7.2020 Please place lab orders for Friday 8.7.2020, office visit for physical on Tuesday 8.11.2020 Thank you, Dyke Maes RT(R)

## 2019-07-17 ENCOUNTER — Other Ambulatory Visit: Payer: 59

## 2019-07-17 ENCOUNTER — Other Ambulatory Visit: Payer: Self-pay

## 2019-07-21 ENCOUNTER — Encounter: Payer: 59 | Admitting: Family Medicine

## 2019-07-21 DIAGNOSIS — Z0289 Encounter for other administrative examinations: Secondary | ICD-10-CM

## 2019-09-09 ENCOUNTER — Other Ambulatory Visit: Payer: Self-pay | Admitting: Family Medicine

## 2019-09-11 ENCOUNTER — Ambulatory Visit: Payer: 59 | Admitting: Family Medicine

## 2019-09-28 ENCOUNTER — Other Ambulatory Visit: Payer: Self-pay | Admitting: Physician Assistant

## 2019-09-29 NOTE — Telephone Encounter (Signed)
Rx request sent to pharmacy.  

## 2019-10-12 DIAGNOSIS — I5032 Chronic diastolic (congestive) heart failure: Secondary | ICD-10-CM | POA: Insufficient documentation

## 2019-10-12 NOTE — Progress Notes (Signed)
Cardiology Office Note   Date:  10/13/2019   ID:  [name] [name], DOB [birth year], MRN [id number]  PCP:  Jinny Sanders, MD  Cardiologist:   Minus Breeding, MD   No chief complaint on file.     History of Present Illness: [name] [name] is a 58 y.o. female who presents for follow up of atrial fib. She had atrial fib in 2012.  She had an abnormal stress test with normal coronaries on cath.  EF is 55%.  She was having problems with her thyroid at that point in time. She had chest pain and a POET (Plain Old Exercise Treadmill) .  This was negative for ischemia.  I did send her for an event monitor to make sure she was not having any recurrent fibrillation.  There was never any evidence of fibrillation.  She was taken off of anticoagulation as it was felt that her atrial fibrillation was related to her thyroid problems at the time.  These had since been corrected.  In 2018 she was found to have an elevated troponin and an EF of 35-40%.  Cardiac cath demonstrated normal coronaries however.    She has very poorly controlled diabetes.  Since I last saw her she was in the hosptial twice with poorly DKA.  She is unfortunately not compliant with medications.  Lexiscan Myoview in May was within normal ejection fraction and no evidence of ischemia.  Since I last saw her she had some palpitations.  However, these are very sporadic.  She feels skipped beats.  She says this only happens every few months.  She has had no presyncope or syncope.  She has been tired.  However, she does not have any chest pressure, neck or arm discomfort.  She has no weight gain or edema.  She does do a little walking for exercise.  She does some chores in the kitchen.   Past Medical History:  Diagnosis Date  . Allergy    seasonal  . Anxiety   . B12 deficiency   . Chest pain 04/05/2017  . CHF (congestive heart failure) ([place])   . Diabetes mellitus type 1, uncontrolled ([place])    "dr. just changed me to type 1, uncontrolled"  (11/26/2018)  . DKA (diabetic ketoacidoses) ([place]) 05/25/2018  . GERD (gastroesophageal reflux disease)   . Hyperlipidemia   . Hypertension    [name]  . Hypothyroidism   . IBS (irritable bowel syndrome)   . Intermittent vertigo   . 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 Leg cramping    "@ night" (09/18/2013)  . Migraine    "once q couple months" (11/26/2018)  . Osteoporosis   . Peripheral neuropathy   . Umbilical hernia    unrepaired (09/18/2013)    Past Surgical History:  Procedure Laterality Date  . BIOPSY  06/03/2019   Procedure: BIOPSY;  Surgeon: Jackquline Denmark, MD;  Location: [facility] ENDOSCOPY;  Service: Endoscopy;;  . [place]  . ENTEROSCOPY N/A 06/03/2019   Procedure: ENTEROSCOPY;  Surgeon: Jackquline Denmark, MD;  Location: [facility] ENDOSCOPY;  Service: Endoscopy;  Laterality: N/A;  . LAPAROSCOPIC ASSISTED VAGINAL HYSTERECTOMY  09/23/2012   Procedure: LAPAROSCOPIC ASSISTED VAGINAL HYSTERECTOMY;  Surgeon: Linda Hedges, DO;  Location: [facility];  Service: Gynecology;  Laterality: N/A;  pull Dr Gregor Hams instrument  . LEFT HEART CATH AND CORONARY ANGIOGRAPHY N/A 02/11/2017   Procedure: Left Heart Cath and Coronary Angiography;  Surgeon: Lorretta Harp, MD;  Location: [facility]  INVASIVE CV LAB;  Service: Cardiovascular;  Laterality: N/A;  . TUBAL LIGATION Bilateral 1992     Current Outpatient Medications  Medication Sig Dispense Refill  . acetaminophen (TYLENOL) 325 MG tablet Take 650 mg by mouth every 6 (six) hours as needed for mild pain, fever or headache.    . Continuous Blood Gluc Sensor (FREESTYLE LIBRE 14 DAY SENSOR) MISC by Does not apply route.    . fexofenadine (ALLEGRA) 180 MG tablet Take 1 tablet (180 mg total) by mouth daily. 90 tablet 3  . furosemide (LASIX) 20 MG tablet Take 1 tablet (20 mg total) by mouth as needed for fluid. 30 tablet 1  . insulin lispro (HUMALOG KWIKPEN) 100 UNIT/ML KiwkPen Inject into skin 3 times a day at meal time according to  the following sliding scale: CBG 70-120 - 0 units / CBG 121-150 - 1 unit / CBG 151-200 - 2 units / CBG 201-250 - 3 units / CBG 251-300 - 5 units / CBG 301-350 - 7 units / CBG 351-400+ - 9 units  YOU MUST FOLLOW A STRICT DIABETIC DIET (Patient taking differently: Inject into the skin See admin instructions. 8 units plus sliding scale Inject into skin 3 times a day at meal time according to the following sliding scale: CBG 70-120 - 0 units / CBG 121-150 - 1 unit / CBG 151-200 - 2 units / CBG 201-250 - 3 units / CBG 251-300 - 5 units / CBG 301-350 - 7 units / CBG 351-400+ - 9 units  YOU MUST FOLLOW A STRICT DIABETIC DIET) 4 pen 11  . Insulin Pen Needle (PEN NEEDLES) 31G X 8 MM MISC Use to inject insulin 4 times a day.  Dx: E11.10 130 each 11  . Lancets (ONETOUCH ULTRASOFT) lancets Use to check blood sugar daily as needed.  Dx: E11.10 100 each 3  . levothyroxine (SYNTHROID, LEVOTHROID) 150 MCG tablet TAKE 1 TABLET BY MOUTH EVERY DAY (Patient taking differently: Take 150 mcg by mouth daily before breakfast. ) 90 tablet 3  . losartan (COZAAR) 25 MG tablet Take 1 tablet (25 mg total) by mouth daily. 90 tablet 1  . metoCLOPramide (REGLAN) 5 MG tablet Take 5mg  before breakfast lunch and dinner and 10mg  at bedtime 150 tablet 11  . metoprolol succinate (TOPROL-XL) 25 MG 24 hr tablet TAKE 1 TABLET BY MOUTH EVERY DAY 90 tablet 2  . nitroGLYCERIN (NITRODUR - DOSED IN MG/24 HR) 0.2 mg/hr patch APPLY 1 PATCH TO SKIN EVERY DAY AS NEEDED 90 patch 1  . ONETOUCH VERIO test strip USE TO CHECK BLOOD SUGAR DAILY AS NEEDED. DX: E11.10 100 strip 3  . pantoprazole (PROTONIX) 40 MG tablet Take 1 tablet (40 mg total) by mouth 2 (two) times daily before a meal. 30 tablet 11  . promethazine (PHENERGAN) 25 MG tablet TAKE 1 TABLET BY MOUTH EVERY 8 HOURS AS NEEDED FOR NAUSEA AND VOMITING 30 tablet 1  . rosuvastatin (CRESTOR) 40 MG tablet Take 1 tablet (40 mg total) by mouth daily. 90 tablet 3  . sertraline (ZOLOFT)  100 MG tablet Take 1 tablet (100 mg total) by mouth daily. 90 tablet 1  . TRESIBA FLEXTOUCH 100 UNIT/ML SOPN FlexTouch Pen Inject 0.24 mLs (24 Units total) into the skin at bedtime. On 10/20/18 take at lunch time, on 10/21/18 take at dinner time. On 10/22/18 resume at bedtime regimen.  5   No current facility-administered medications for this visit.     Allergies:   Codeine    ROS:  Please see the history of present illness.   Otherwise, review of systems are positive for none.   All other systems are reviewed and negative.    PHYSICAL EXAM: VS:  BP 105/79   Pulse 97   Temp (!) 97 F (36.1 C)   Ht 4' 11.5" (1.511 m)   Wt 132 lb 6.4 oz (60.1 kg)   LMP 08/11/2012   SpO2 96%   BMI 26.29 kg/m  , BMI Body mass index is 26.29 kg/m.  GENERAL:  Well appearing NECK:  No jugular venous distention, waveform within normal limits, carotid upstroke brisk and symmetric, no bruits, no thyromegaly LUNGS:  Clear to auscultation bilaterally CHEST:  Unremarkable HEART:  PMI not displaced or sustained,S1 and S2 within normal limits, no S3, no S4, no clicks, no rubs, no murmurs ABD:  Flat, positive bowel sounds normal in frequency in pitch, no bruits, no rebound, no guarding, no midline pulsatile mass, no hepatomegaly, no splenomegaly EXT:  2 plus pulses throughout, no edema, no cyanosis no clubbing  EKG:  EKG is not ordered today.   Recent Labs: 03/26/2019: Magnesium 1.5 04/23/2019: TSH 10.067 05/31/2019: ALT 12 06/03/2019: BUN 6; Creatinine, Ser 0.58; Hemoglobin 14.1; Platelets 289; Potassium 3.7; Sodium 137    Lipid Panel    Component Value Date/Time   CHOL 197 01/27/2019 0809   TRIG 148.0 01/27/2019 0809   HDL 43.10 01/27/2019 0809   CHOLHDL 5 01/27/2019 0809   VLDL 29.6 01/27/2019 0809   LDLCALC 124 (H) 01/27/2019 0809   LDLDIRECT 206.0 07/27/2014 0745   Lab Results  Component Value Date   HGBA1C 11.6 (H) 03/25/2019     Wt Readings from Last 3 Encounters:  10/13/19 132 lb 6.4 oz  (60.1 kg)  06/03/19 136 lb 0.4 oz (61.7 kg)  05/25/19 142 lb (64.4 kg)      Other studies Reviewed: Additional studies/ records that were reviewed today include: Labs Review of the above records demonstrates:  N   ASSESSMENT AND PLAN:   Chronic systolic/diastolic heart failure:   Her ejection fraction was much improved on the most recent echo.  It is back to normal.  No change in therapy.  Noncardiac chest pain: She has no new chest discomfort since her stress test last year.  No further work-up.  HTN:  This is being managed in the context of treating his CHF  Poorly controlled diabetes:   Her A1c is now 11.5 this is she says down from 15.  She is working with an endocrinologist.  Previous history of atrial fibrillation: She has had no symptomatic recurrences of this.  No change in therapy.  Questionable sleep apnea.     She has had low O2 sats in the hospital.   We tried to arrange follow-up testing but she was a no-show for this.  Current medicines are reviewed at length with the patient today.  The patient does not have concerns regarding medicines.  The following changes have been made:  None Labs/ tests ordered today include:  NOne   No orders of the defined types were placed in this encounter.   Disposition:   FU with APP in 12 months.   Signed, Minus Breeding, MD  10/13/2019 8:13 PM    [place]

## 2019-10-13 ENCOUNTER — Encounter: Payer: Self-pay | Admitting: Cardiology

## 2019-10-13 ENCOUNTER — Other Ambulatory Visit: Payer: Self-pay

## 2019-10-13 ENCOUNTER — Ambulatory Visit: Payer: 59 | Admitting: Cardiology

## 2019-10-13 VITALS — BP 105/79 | HR 97 | Temp 97.0°F | Ht 59.5 in | Wt 132.4 lb

## 2019-10-13 DIAGNOSIS — R0789 Other chest pain: Secondary | ICD-10-CM

## 2019-10-13 DIAGNOSIS — I10 Essential (primary) hypertension: Secondary | ICD-10-CM | POA: Diagnosis not present

## 2019-10-13 DIAGNOSIS — I5032 Chronic diastolic (congestive) heart failure: Secondary | ICD-10-CM | POA: Diagnosis not present

## 2019-10-13 DIAGNOSIS — Z794 Long term (current) use of insulin: Secondary | ICD-10-CM

## 2019-10-13 DIAGNOSIS — E118 Type 2 diabetes mellitus with unspecified complications: Secondary | ICD-10-CM

## 2019-10-13 DIAGNOSIS — I480 Paroxysmal atrial fibrillation: Secondary | ICD-10-CM

## 2019-10-13 NOTE — Patient Instructions (Signed)
Medication Instructions:  Your physician recommends that you continue on your current medications as directed. Please refer to the Current Medication list given to you today.  *If you need a refill on your cardiac medications before your next appointment, please call your pharmacy*  Lab Work: NONE If you have labs (blood work) drawn today and your tests are completely normal, you will receive your results only by: Marland Kitchen MyChart Message (if you have MyChart) OR . A paper copy in the mail If you have any lab test that is abnormal or we need to change your treatment, we will call you to review the results.  Testing/Procedures: NONE  Follow-Up: At [facility], you and your health needs are our priority.  As part of our continuing mission to provide you with exceptional heart care, we have created designated Provider Care Teams.  These Care Teams include your primary Cardiologist (physician) and Advanced Practice Providers (APPs -  Physician Assistants and Nurse Practitioners) who all work together to provide you with the care you need, when you need it.  Your next appointment:   12 months  The format for your next appointment:   Either In Person or Virtual  Provider:   You may see one of the following Advanced Practice Providers on your designated Care Team:     Jory Sims, DNP, ANP

## 2019-11-26 ENCOUNTER — Other Ambulatory Visit: Payer: Self-pay | Admitting: Family Medicine

## 2019-11-26 NOTE — Telephone Encounter (Signed)
Last office visit 04/28/2019 for hospital follow up.  AVS states sertraline increased to 100 mg daily and with close follow up in one month. No future appointments.  Ok to refill?

## 2019-11-27 ENCOUNTER — Other Ambulatory Visit: Payer: Self-pay | Admitting: Family Medicine

## 2019-11-27 NOTE — Telephone Encounter (Signed)
Last office visit 04/28/2019 for hospital follow up.  Last thyroid labs 09/11/2018.  No future appointments.  Refill?

## 2019-11-27 NOTE — Telephone Encounter (Signed)
[name] [name], please schedule lab appt for thyroid tests.  Must schedule appointment before her medication can be refilled.  Please send back to me once appointment is made for her to refill. Thanks!

## 2019-12-02 NOTE — Telephone Encounter (Signed)
Labs schedule 12/28 Pt aware

## 2019-12-07 ENCOUNTER — Other Ambulatory Visit: Payer: 59

## 2019-12-07 ENCOUNTER — Other Ambulatory Visit: Payer: Self-pay

## 2019-12-17 ENCOUNTER — Other Ambulatory Visit: Payer: Self-pay | Admitting: Family Medicine

## 2019-12-17 NOTE — Telephone Encounter (Signed)
Last office visit 04/28/2019 for hospital follow up.  Last refilled 07/03/2019 for #30 with 1 refill.  No future appointments.

## 2019-12-24 ENCOUNTER — Ambulatory Visit: Payer: 59 | Attending: Internal Medicine

## 2019-12-24 DIAGNOSIS — Z20822 Contact with and (suspected) exposure to covid-19: Secondary | ICD-10-CM

## 2019-12-25 LAB — NOVEL CORONAVIRUS, NAA: SARS-CoV-2, NAA: NOT DETECTED

## 2020-01-11 ENCOUNTER — Other Ambulatory Visit: Payer: Self-pay | Admitting: Physician Assistant

## 2020-01-14 ENCOUNTER — Ambulatory Visit: Payer: 59 | Admitting: Family Medicine

## 2020-01-14 ENCOUNTER — Other Ambulatory Visit: Payer: Self-pay

## 2020-01-14 ENCOUNTER — Encounter: Payer: Self-pay | Admitting: Family Medicine

## 2020-01-14 VITALS — BP 94/58 | HR 104 | Temp 98.2°F | Ht 59.5 in | Wt 125.8 lb

## 2020-01-14 DIAGNOSIS — R350 Frequency of micturition: Secondary | ICD-10-CM | POA: Diagnosis not present

## 2020-01-14 DIAGNOSIS — E1100 Type 2 diabetes mellitus with hyperosmolarity without nonketotic hyperglycemic-hyperosmolar coma (NKHHC): Secondary | ICD-10-CM

## 2020-01-14 DIAGNOSIS — I5032 Chronic diastolic (congestive) heart failure: Secondary | ICD-10-CM | POA: Diagnosis not present

## 2020-01-14 DIAGNOSIS — F320 Major depressive disorder, single episode, mild: Secondary | ICD-10-CM

## 2020-01-14 DIAGNOSIS — I480 Paroxysmal atrial fibrillation: Secondary | ICD-10-CM

## 2020-01-14 DIAGNOSIS — E1165 Type 2 diabetes mellitus with hyperglycemia: Secondary | ICD-10-CM | POA: Diagnosis not present

## 2020-01-14 DIAGNOSIS — Z794 Long term (current) use of insulin: Secondary | ICD-10-CM

## 2020-01-14 DIAGNOSIS — E1142 Type 2 diabetes mellitus with diabetic polyneuropathy: Secondary | ICD-10-CM

## 2020-01-14 LAB — POCT GLYCOSYLATED HEMOGLOBIN (HGB A1C): Hemoglobin A1C: 15 % — AB (ref 4.0–5.6)

## 2020-01-14 LAB — POCT URINALYSIS DIPSTICK
Bilirubin, UA: NEGATIVE
Blood, UA: NEGATIVE
Glucose, UA: POSITIVE — AB
Ketones, UA: NEGATIVE
Leukocytes, UA: NEGATIVE
Nitrite, UA: NEGATIVE
Protein, UA: NEGATIVE
Spec Grav, UA: 1.015 (ref 1.010–1.025)
Urobilinogen, UA: 0.2 E.U./dL
pH, UA: 6 (ref 5.0–8.0)

## 2020-01-14 LAB — HM DIABETES FOOT EXAM

## 2020-01-14 NOTE — Patient Instructions (Addendum)
Make sure to take diabetes  medicine. We will call with appt for counselor and endocrinologist. Increase fluids, start heat and gentle stretching for back pain. Can use tylenol for pain.

## 2020-01-14 NOTE — Progress Notes (Signed)
Chief Complaint  Patient presents with  . Back Pain    lower back x 1 week  . Flank Pain    lower back x 1 week  . abnormal urine odor    denies dysuria    History of Present Illness: HPI   59 year old female with poorly controlled insulin dependant DM presents with possible UTI.   She reports abnormal urine odor, frequency , urgency as well as low back pain and bilateral back pain x 1 week.  No hematuria. No abdominal pain change. No vaginal discharge or irritaiton.   No recent UTI or antibiotics.  CBGs lately have been higher 300 ( but did miss med dose)  LAST ENDO visit has been a while ago.  She is not comfortable with Dr. Hartford Poli so wishes to change.  She has lost 11 lbs.  Lab Results  Component Value Date   HGBA1C 11.6 (H) 03/25/2019       MDD, she has been very irritable lately. She has been under a lot of stress and is interested in counselor.  was seein Dr. Cruzita Lederer in past but missed appointment.Marland Kitchen discharged.  Wt Readings from Last 3 Encounters:  01/14/20 125 lb 12.8 oz (57.1 kg)  10/13/19 132 lb 6.4 oz (60.1 kg)  06/03/19 136 lb 0.4 oz (61.7 kg)     This visit occurred during the SARS-CoV-2 public health emergency.  Safety protocols were in place, including screening questions prior to the visit, additional usage of staff PPE, and extensive cleaning of exam room while observing appropriate contact time as indicated for disinfecting solutions.   COVID 19 screen:  No recent travel or known exposure to COVID19 The patient denies respiratory symptoms of COVID 19 at this time. The importance of social distancing was discussed today.     Review of Systems  Constitutional: Negative for chills and fever.  HENT: Negative for congestion and ear pain.   Eyes: Negative for pain and redness.  Respiratory: Negative for cough and shortness of breath.   Cardiovascular: Negative for chest pain, palpitations and leg swelling.  Gastrointestinal: Negative for  abdominal pain, blood in stool, constipation, diarrhea, nausea and vomiting.  Genitourinary: Negative for dysuria.  Musculoskeletal: Negative for falls and myalgias.  Skin: Negative for rash.  Neurological: Negative for dizziness.  Psychiatric/Behavioral: Negative for depression. The patient is not nervous/anxious.       Past Medical History:  Diagnosis Date  . Allergy    seasonal  . Anxiety   . B12 deficiency   . Chest pain 04/05/2017  . CHF (congestive heart failure) ([place])   . Diabetes mellitus type 1, uncontrolled ([place])    "dr. just changed me to type 1, uncontrolled" (11/26/2018)  . DKA (diabetic ketoacidoses) ([place]) 05/25/2018  . GERD (gastroesophageal reflux disease)   . Hyperlipidemia   . Hypertension    [name]  . Hypothyroidism   . IBS (irritable bowel syndrome)   . Intermittent vertigo   . 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 Leg cramping    "@ night" (09/18/2013)  . Migraine    "once q couple months" (11/26/2018)  . Osteoporosis   . Peripheral neuropathy   . Umbilical hernia    unrepaired (09/18/2013)    reports that she quit smoking about 29 years ago. Her smoking use included cigarettes. She has a 1.20 pack-year smoking history. She has never used smokeless tobacco. She reports current drug use. Drug: Marijuana. She reports that she does not drink  alcohol.   Current Outpatient Medications:  .  acetaminophen (TYLENOL) 325 MG tablet, Take 650 mg by mouth every 6 (six) hours as needed for mild pain, fever or headache., Disp: , Rfl:  .  Continuous Blood Gluc Sensor (FREESTYLE LIBRE 14 DAY SENSOR) MISC, by Does not apply route., Disp: , Rfl:  .  fexofenadine (ALLEGRA) 180 MG tablet, Take 1 tablet (180 mg total) by mouth daily., Disp: 90 tablet, Rfl: 3 .  furosemide (LASIX) 20 MG tablet, Take 1 tablet (20 mg total) by mouth as needed for fluid., Disp: 30 tablet, Rfl: 1 .  insulin lispro (HUMALOG KWIKPEN) 100 UNIT/ML KiwkPen, Inject into  skin 3 times a day at meal time according to the following sliding scale: CBG 70-120 - 0 units / CBG 121-150 - 1 unit / CBG 151-200 - 2 units / CBG 201-250 - 3 units / CBG 251-300 - 5 units / CBG 301-350 - 7 units / CBG 351-400+ - 9 units  YOU MUST FOLLOW A STRICT DIABETIC DIET (Patient taking differently: Inject into the skin See admin instructions. 8 units plus sliding scale Inject into skin 3 times a day at meal time according to the following sliding scale: CBG 70-120 - 0 units / CBG 121-150 - 1 unit / CBG 151-200 - 2 units / CBG 201-250 - 3 units / CBG 251-300 - 5 units / CBG 301-350 - 7 units / CBG 351-400+ - 9 units  YOU MUST FOLLOW A STRICT DIABETIC DIET), Disp: 4 pen, Rfl: 11 .  Insulin Pen Needle (PEN NEEDLES) 31G X 8 MM MISC, Use to inject insulin 4 times a day.  Dx: E11.10, Disp: 130 each, Rfl: 11 .  Lancets (ONETOUCH ULTRASOFT) lancets, Use to check blood sugar daily as needed.  Dx: E11.10, Disp: 100 each, Rfl: 3 .  levothyroxine (SYNTHROID) 150 MCG tablet, TAKE 1 TABLET BY MOUTH EVERY DAY, Disp: 90 tablet, Rfl: 3 .  losartan (COZAAR) 25 MG tablet, Take 1 tablet (25 mg total) by mouth daily., Disp: 90 tablet, Rfl: 1 .  metoCLOPramide (REGLAN) 5 MG tablet, Take 5mg  before breakfast lunch and dinner and 10mg  at bedtime, Disp: 150 tablet, Rfl: 11 .  metoprolol succinate (TOPROL-XL) 25 MG 24 hr tablet, TAKE 1 TABLET BY MOUTH EVERY DAY, Disp: 90 tablet, Rfl: 2 .  nitroGLYCERIN (NITRODUR - DOSED IN MG/24 HR) 0.2 mg/hr patch, APPLY 1 PATCH TO SKIN EVERY DAY AS NEEDED, Disp: 90 patch, Rfl: 1 .  ONETOUCH VERIO test strip, USE TO CHECK BLOOD SUGAR DAILY AS NEEDED. DX: E11.10, Disp: 100 strip, Rfl: 3 .  pantoprazole (PROTONIX) 40 MG tablet, Take 1 tablet (40 mg total) by mouth 2 (two) times daily before a meal., Disp: 30 tablet, Rfl: 11 .  promethazine (PHENERGAN) 25 MG tablet, TAKE 1 TABLET BY MOUTH EVERY 8 HOURS AS NEEDED FOR NAUSEA AND VOMITING, Disp: 30 tablet, Rfl: 1 .  sertraline (ZOLOFT) 100 MG  tablet, TAKE 1 TABLET BY MOUTH EVERY DAY, Disp: 90 tablet, Rfl: 1 .  TRESIBA FLEXTOUCH 100 UNIT/ML SOPN FlexTouch Pen, Inject 0.24 mLs (24 Units total) into the skin at bedtime. On 10/20/18 take at lunch time, on 10/21/18 take at dinner time. On 10/22/18 resume at bedtime regimen., Disp: , Rfl: 5 .  rosuvastatin (CRESTOR) 40 MG tablet, Take 1 tablet (40 mg total) by mouth daily., Disp: 90 tablet, Rfl: 3   Observations/Objective: Blood pressure (!) 94/58, pulse (!) 104, temperature 98.2 F (36.8 C), height 4' 11.5" (1.511 m), weight  125 lb 12.8 oz (57.1 kg), last menstrual period 08/11/2012, SpO2 98 %.  Physical Exam Constitutional:      General: She is not in acute distress.    Appearance: Normal appearance. She is well-developed. She is obese. She is not ill-appearing or toxic-appearing.     Comments: Unkempt appearing  HENT:     Head: Normocephalic.     Right Ear: Hearing, tympanic membrane, ear canal and external ear normal. Tympanic membrane is not erythematous, retracted or bulging.     Left Ear: Hearing, tympanic membrane, ear canal and external ear normal. Tympanic membrane is not erythematous, retracted or bulging.     Nose: No mucosal edema or rhinorrhea.     Right Sinus: No maxillary sinus tenderness or frontal sinus tenderness.     Left Sinus: No maxillary sinus tenderness or frontal sinus tenderness.     Mouth/Throat:     Pharynx: Uvula midline.  Eyes:     General: Lids are normal. Lids are everted, no foreign bodies appreciated.     Conjunctiva/sclera: Conjunctivae normal.     Pupils: Pupils are equal, round, and reactive to light.  Neck:     Thyroid: No thyroid mass or thyromegaly.     Vascular: No carotid bruit.     Trachea: Trachea normal.  Cardiovascular:     Rate and Rhythm: Normal rate and regular rhythm.     Pulses: Normal pulses.     Heart sounds: Normal heart sounds, S1 normal and S2 normal. No murmur. No friction rub. No gallop.   Pulmonary:     Effort:  Pulmonary effort is normal. No tachypnea or respiratory distress.     Breath sounds: Normal breath sounds. No decreased breath sounds, wheezing, rhonchi or rales.  Abdominal:     General: Bowel sounds are normal.     Palpations: Abdomen is soft.     Tenderness: There is no abdominal tenderness.  Musculoskeletal:     Cervical back: Normal range of motion and neck supple.  Skin:    General: Skin is warm and dry.     Findings: No rash.  Neurological:     Mental Status: She is alert.  Psychiatric:        Mood and Affect: Mood is not anxious or depressed.        Speech: Speech normal.        Behavior: Behavior normal. Behavior is cooperative.        Thought Content: Thought content normal.        Judgment: Judgment normal.     Diabetic foot exam: Normal inspection No skin breakdown No calluses  Normal DP pulses decreased sensation to light touch and monofilament Nails normal   Assessment and Plan   Uncontrolled diabetes mellitus with hyperosmolarity, with long-term current use of insulin ([name])  Difficult to control diabetes in pt with episodes of non-compliance leading to multiple hospital admissions.  Pt states she is ready to be more compliant with meds and diet.  Refer to new ENDO.  MDD (major depressive disorder), single episode, mild ([place]) Inadequate control contributes to her control of her diabetes.  despite higher dose of  sertraline.. pt refuses med change  But is open to  psychology referral at this time.  Urinary frequency Likely due to poor DM control. UA today negative. Low back pain likely secondary to chronic low back pain issues.  Increase fluids, start heat and gentle stretching for back pain. Can use tylenol for pain.  [name] Shreeve, MD   

## 2020-02-04 ENCOUNTER — Other Ambulatory Visit: Payer: Self-pay

## 2020-02-04 ENCOUNTER — Other Ambulatory Visit (INDEPENDENT_AMBULATORY_CARE_PROVIDER_SITE_OTHER): Payer: 59

## 2020-02-04 DIAGNOSIS — Z1159 Encounter for screening for other viral diseases: Secondary | ICD-10-CM

## 2020-02-04 DIAGNOSIS — E538 Deficiency of other specified B group vitamins: Secondary | ICD-10-CM

## 2020-02-04 LAB — VITAMIN B12: Vitamin B-12: 254 pg/mL (ref 211–911)

## 2020-02-04 LAB — MAGNESIUM: Magnesium: 1.8 mg/dL (ref 1.5–2.5)

## 2020-02-05 LAB — HEPATITIS C ANTIBODY
Hepatitis C Ab: NONREACTIVE
SIGNAL TO CUT-OFF: 0.01 (ref <1.00)

## 2020-02-05 NOTE — Progress Notes (Signed)
No critical labs need to be addressed urgently. We will discuss labs in detail at upcoming office visit.   

## 2020-02-08 ENCOUNTER — Ambulatory Visit (INDEPENDENT_AMBULATORY_CARE_PROVIDER_SITE_OTHER): Payer: 59 | Admitting: Psychology

## 2020-02-08 DIAGNOSIS — F4312 Post-traumatic stress disorder, chronic: Secondary | ICD-10-CM

## 2020-02-10 DIAGNOSIS — R350 Frequency of micturition: Secondary | ICD-10-CM | POA: Insufficient documentation

## 2020-02-10 NOTE — Assessment & Plan Note (Addendum)
Inadequate control contributes to her control of her diabetes.  despite higher dose of  sertraline.. pt refuses med change  But is open to  psychology referral at this time.

## 2020-02-10 NOTE — Assessment & Plan Note (Signed)
Difficult to control diabetes in pt with episodes of non-compliance leading to multiple hospital admissions.  Pt states she is ready to be more compliant with meds and diet.  Refer to new ENDO.

## 2020-02-10 NOTE — Assessment & Plan Note (Signed)
Likely due to poor DM control. UA today negative. Low back pain likely secondary to chronic low back pain issues.  Increase fluids, start heat and gentle stretching for back pain. Can use tylenol for pain.

## 2020-02-11 ENCOUNTER — Other Ambulatory Visit: Payer: Self-pay

## 2020-02-11 ENCOUNTER — Ambulatory Visit: Payer: 59 | Admitting: Family Medicine

## 2020-02-11 ENCOUNTER — Encounter: Payer: Self-pay | Admitting: Family Medicine

## 2020-02-11 VITALS — BP 114/80 | HR 92 | Temp 98.1°F | Ht 59.75 in | Wt 122.8 lb

## 2020-02-11 DIAGNOSIS — F320 Major depressive disorder, single episode, mild: Secondary | ICD-10-CM

## 2020-02-11 DIAGNOSIS — E1142 Type 2 diabetes mellitus with diabetic polyneuropathy: Secondary | ICD-10-CM

## 2020-02-11 DIAGNOSIS — I480 Paroxysmal atrial fibrillation: Secondary | ICD-10-CM | POA: Diagnosis not present

## 2020-02-11 DIAGNOSIS — Z0000 Encounter for general adult medical examination without abnormal findings: Secondary | ICD-10-CM

## 2020-02-11 DIAGNOSIS — Z23 Encounter for immunization: Secondary | ICD-10-CM

## 2020-02-11 DIAGNOSIS — I10 Essential (primary) hypertension: Secondary | ICD-10-CM

## 2020-02-11 DIAGNOSIS — E1165 Type 2 diabetes mellitus with hyperglycemia: Secondary | ICD-10-CM

## 2020-02-11 DIAGNOSIS — I5022 Chronic systolic (congestive) heart failure: Secondary | ICD-10-CM | POA: Diagnosis not present

## 2020-02-11 DIAGNOSIS — K3184 Gastroparesis: Secondary | ICD-10-CM

## 2020-02-11 DIAGNOSIS — E1143 Type 2 diabetes mellitus with diabetic autonomic (poly)neuropathy: Secondary | ICD-10-CM

## 2020-02-11 MED ORDER — MUPIROCIN 2 % EX OINT: 1.0000 "application " | TOPICAL_OINTMENT | Freq: Two times a day (BID) | CUTANEOUS | 0 refills | Status: DC

## 2020-02-11 MED ORDER — FEXOFENADINE HCL 180 MG PO TABS: 180.0000 mg | ORAL_TABLET | Freq: Every day | ORAL | 3 refills | Status: DC

## 2020-02-11 NOTE — Assessment & Plan Note (Signed)
Followed by cardiology 

## 2020-02-11 NOTE — Assessment & Plan Note (Signed)
Poor control. Continue counseling but will refer to psychiatry for med recommendations.

## 2020-02-11 NOTE — Assessment & Plan Note (Signed)
Recent colonoscopy per GI reviewed.  ON reglan, and other antiemetic.

## 2020-02-11 NOTE — Assessment & Plan Note (Signed)
Well controlled. Continue current medication.  

## 2020-02-11 NOTE — Patient Instructions (Addendum)
Increase Triseiba to 30 Units at bedtime. Continue SSI. Increase water intake.  We will call with psychiatry referral as well.   ENDO referral is pending.. or consider talking to Dr. Synthia Innocent office.  Call to set up mammogram on your own.

## 2020-02-11 NOTE — Assessment & Plan Note (Signed)
Awaiting ENDO referral.  IN meantime increase longacting insulin to 30 units daily. Encouraged exercise, weight loss, healthy eating habits.

## 2020-02-11 NOTE — Assessment & Plan Note (Signed)
Euvolemic. 

## 2020-02-11 NOTE — Progress Notes (Signed)
Chief Complaint  Patient presents with  . Annual Exam    History of Present Illness: HPI  The patient is here for annual wellness exam and preventative care.    DM: poor control.Marland Kitchen awaiting arrangement of new ENDO.  Trying to be more compliant with meds.  FBS  200-300  On sliding cale and Triseiba at bedtime.  No sores on feet.  Had eye exam per pt.. will get records She is using nausea meds to avoid emesis.. Followed by GI.  Paroxsysmal AFib and sys/diast heart failure: followed by Cardiology.  Euvolemic and rate controlled.  Last note reviewed. 10/2019 Dr. Percival Spanish.  Hypertension:   Good control on losartan, metoprolol, lasix   BP Readings from Last 3 Encounters:  02/11/20 114/80  01/14/20 (!) 94/58  10/13/19 105/79  Using medication without problems or lightheadedness:  none Chest pain with exertion:none Edema:none Short of breath:none Average home BPs: Other issues:  MDD: on high dose sertraline and referred to counselor. Still under a lot of stress. She requests referral to psychiatrist for mediation adjustment.  Elevated Cholesterol: Due for re-eval on crestor. Using medications without problems: Muscle aches:  Diet compliance: Exercise: Other complaints:   Magnesium in nml range   B12 def: improved.  Few days ago hit right 3 rd digit... now slight redness surrounding.. using  Neosporin.   This visit occurred during the SARS-CoV-2 public health emergency.  Safety protocols were in place, including screening questions prior to the visit, additional usage of staff PPE, and extensive cleaning of exam room while observing appropriate contact time as indicated for disinfecting solutions.   COVID 19 screen:  No recent travel or known exposure to COVID19 The patient denies respiratory symptoms of COVID 19 at this time. The importance of social distancing was discussed today.     Review of Systems  Constitutional: Negative for chills and fever.  HENT:  Negative for congestion and ear pain.   Eyes: Negative for pain and redness.  Respiratory: Negative for cough and shortness of breath.   Cardiovascular: Negative for chest pain, palpitations and leg swelling.  Gastrointestinal: Positive for abdominal pain and nausea. Negative for blood in stool, constipation, diarrhea and vomiting.  Genitourinary: Negative for dysuria.  Musculoskeletal: Negative for falls and myalgias.  Skin: Negative for rash.  Neurological: Positive for weakness. Negative for dizziness.  Psychiatric/Behavioral: Negative for depression. The patient is not nervous/anxious.       Past Medical History:  Diagnosis Date  . Allergy    seasonal  . Anxiety   . B12 deficiency   . Chest pain 04/05/2017  . CHF (congestive heart failure) ([place])   . Diabetes mellitus type 1, uncontrolled ([place])    "dr. just changed me to type 1, uncontrolled" (11/26/2018)  . DKA (diabetic ketoacidoses) ([place]) 05/25/2018  . GERD (gastroesophageal reflux disease)   . Hyperlipidemia   . Hypertension    [name]  . Hypothyroidism   . IBS (irritable bowel syndrome)   . Intermittent vertigo   . 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 Leg cramping    "@ night" (09/18/2013)  . Migraine    "once q couple months" (11/26/2018)  . Osteoporosis   . Peripheral neuropathy   . Umbilical hernia    unrepaired (09/18/2013)    reports that she quit smoking about 29 years ago. Her smoking use included cigarettes. She has a 1.20 pack-year smoking history. She has never used smokeless tobacco. She reports current drug use. Drug: Marijuana. She  reports that she does not drink alcohol.   Current Outpatient Medications:  .  acetaminophen (TYLENOL) 325 MG tablet, Take 650 mg by mouth every 6 (six) hours as needed for mild pain, fever or headache., Disp: , Rfl:  .  Continuous Blood Gluc Sensor (FREESTYLE LIBRE 14 DAY SENSOR) MISC, by Does not apply route., Disp: , Rfl:  .  fexofenadine  (ALLEGRA) 180 MG tablet, Take 1 tablet (180 mg total) by mouth daily., Disp: 90 tablet, Rfl: 3 .  furosemide (LASIX) 20 MG tablet, Take 1 tablet (20 mg total) by mouth as needed for fluid., Disp: 30 tablet, Rfl: 1 .  insulin lispro (HUMALOG KWIKPEN) 100 UNIT/ML KiwkPen, Inject into skin 3 times a day at meal time according to the following sliding scale: CBG 70-120 - 0 units / CBG 121-150 - 1 unit / CBG 151-200 - 2 units / CBG 201-250 - 3 units / CBG 251-300 - 5 units / CBG 301-350 - 7 units / CBG 351-400+ - 9 units  YOU MUST FOLLOW A STRICT DIABETIC DIET (Patient taking differently: Inject into the skin See admin instructions. 8 units plus sliding scale Inject into skin 3 times a day at meal time according to the following sliding scale: CBG 70-120 - 0 units / CBG 121-150 - 1 unit / CBG 151-200 - 2 units / CBG 201-250 - 3 units / CBG 251-300 - 5 units / CBG 301-350 - 7 units / CBG 351-400+ - 9 units  YOU MUST FOLLOW A STRICT DIABETIC DIET), Disp: 4 pen, Rfl: 11 .  Insulin Pen Needle (PEN NEEDLES) 31G X 8 MM MISC, Use to inject insulin 4 times a day.  Dx: E11.10, Disp: 130 each, Rfl: 11 .  Lancets (ONETOUCH ULTRASOFT) lancets, Use to check blood sugar daily as needed.  Dx: E11.10, Disp: 100 each, Rfl: 3 .  levothyroxine (SYNTHROID) 150 MCG tablet, TAKE 1 TABLET BY MOUTH EVERY DAY, Disp: 90 tablet, Rfl: 3 .  losartan (COZAAR) 25 MG tablet, Take 1 tablet (25 mg total) by mouth daily., Disp: 90 tablet, Rfl: 1 .  metoCLOPramide (REGLAN) 5 MG tablet, Take 5mg  before breakfast lunch and dinner and 10mg  at bedtime, Disp: 150 tablet, Rfl: 11 .  metoprolol succinate (TOPROL-XL) 25 MG 24 hr tablet, TAKE 1 TABLET BY MOUTH EVERY DAY, Disp: 90 tablet, Rfl: 2 .  nitroGLYCERIN (NITRODUR - DOSED IN MG/24 HR) 0.2 mg/hr patch, APPLY 1 PATCH TO SKIN EVERY DAY AS NEEDED, Disp: 90 patch, Rfl: 1 .  ONETOUCH VERIO test strip, USE TO CHECK BLOOD SUGAR DAILY AS NEEDED. DX: E11.10, Disp: 100 strip, Rfl: 3 .  pantoprazole  (PROTONIX) 40 MG tablet, Take 1 tablet (40 mg total) by mouth 2 (two) times daily before a meal., Disp: 30 tablet, Rfl: 11 .  promethazine (PHENERGAN) 25 MG tablet, TAKE 1 TABLET BY MOUTH EVERY 8 HOURS AS NEEDED FOR NAUSEA AND VOMITING, Disp: 30 tablet, Rfl: 1 .  sertraline (ZOLOFT) 100 MG tablet, TAKE 1 TABLET BY MOUTH EVERY DAY, Disp: 90 tablet, Rfl: 1 .  TRESIBA FLEXTOUCH 100 UNIT/ML SOPN FlexTouch Pen, Inject 0.24 mLs (24 Units total) into the skin at bedtime. On 10/20/18 take at lunch time, on 10/21/18 take at dinner time. On 10/22/18 resume at bedtime regimen., Disp: , Rfl: 5 .  rosuvastatin (CRESTOR) 40 MG tablet, Take 1 tablet (40 mg total) by mouth daily., Disp: 90 tablet, Rfl: 3   Observations/Objective: Blood pressure 114/80, pulse 92, temperature 98.1 F (36.7 C), temperature source  Temporal, height 4' 11.75" (1.518 m), weight 122 lb 12 oz (55.7 kg), last menstrual period 08/11/2012, SpO2 98 %.  Physical Exam Constitutional:      General: She is not in acute distress.    Appearance: Normal appearance. She is well-developed and normal weight. She is not ill-appearing or toxic-appearing.  HENT:     Head: Normocephalic.     Right Ear: Hearing, tympanic membrane, ear canal and external ear normal. Tympanic membrane is not erythematous, retracted or bulging.     Left Ear: Hearing, tympanic membrane, ear canal and external ear normal. Tympanic membrane is not erythematous, retracted or bulging.     Nose: No mucosal edema or rhinorrhea.     Right Sinus: No maxillary sinus tenderness or frontal sinus tenderness.     Left Sinus: No maxillary sinus tenderness or frontal sinus tenderness.     Mouth/Throat:     Pharynx: Uvula midline.  Eyes:     General: Lids are normal. Lids are everted, no foreign bodies appreciated.     Conjunctiva/sclera: Conjunctivae normal.     Pupils: Pupils are equal, round, and reactive to light.  Neck:     Thyroid: No thyroid mass or thyromegaly.     Vascular:  No carotid bruit.     Trachea: Trachea normal.  Cardiovascular:     Rate and Rhythm: Normal rate and regular rhythm.     Pulses: Normal pulses.     Heart sounds: Normal heart sounds, S1 normal and S2 normal. No murmur. No friction rub. No gallop.   Pulmonary:     Effort: Pulmonary effort is normal. No tachypnea or respiratory distress.     Breath sounds: Normal breath sounds. No decreased breath sounds, wheezing, rhonchi or rales.  Abdominal:     General: Bowel sounds are normal.     Palpations: Abdomen is soft.     Tenderness: There is no abdominal tenderness.  Musculoskeletal:     Cervical back: Normal range of motion and neck supple.  Skin:    General: Skin is warm and dry.     Findings: No rash.     Comments:  Very dry skin  Neurological:     Mental Status: She is alert.  Psychiatric:        Mood and Affect: Mood is not anxious or depressed.        Speech: Speech normal.        Behavior: Behavior normal. Behavior is cooperative.        Thought Content: Thought content normal.        Judgment: Judgment normal.      Assessment and Plan   The patient's preventative maintenance and recommended screening tests for an annual wellness exam were reviewed in full today. Brought up to date unless services declined.  Counselled on the importance of diet, exercise, and its role in overall health and mortality. The patient's FH and SH was reviewed, including their home life, tobacco status, and drug and alcohol status.   Mammogram: Due  Due for eye exam.  Hep C neg  Uptodate with vaccines:  Colonoscopy 05/2019  Former smoking  no ETOH, occ marijuana use  S/P hysterectomy  Paroxysmal A-fib ([place])  Followed by cardiology.    Chronic systolic CHF (congestive heart failure) ([name]) Euvolemic.   Essential hypertension Well controlled. Continue current medication.   Gastroparesis due to DM [facility]) Recent colonoscopy per GI reviewed.  ON reglan, and other antiemetic.  Poorly  controlled type 2 diabetes mellitus with  peripheral neuropathy ([place]) Awaiting ENDO referral.  IN meantime increase longacting insulin to 30 units daily. Encouraged exercise, weight loss, healthy eating habits.   MDD (major depressive disorder), single episode, mild ([name]) Poor control. Continue counseling but will refer to psychiatry for med recommendations.   Eliezer Lofts, MD

## 2020-02-22 ENCOUNTER — Ambulatory Visit: Payer: 59 | Admitting: Psychology

## 2020-03-23 ENCOUNTER — Other Ambulatory Visit: Payer: Self-pay | Admitting: Family Medicine

## 2020-03-23 NOTE — Telephone Encounter (Signed)
Last office visit 02/11/2020 for CPE.  Last refilled 12/17/2019 for #30 with 1 refill.  No future appointments.

## 2020-03-28 ENCOUNTER — Telehealth: Payer: 59 | Admitting: Psychiatry

## 2020-04-05 ENCOUNTER — Other Ambulatory Visit: Payer: Self-pay | Admitting: Cardiology

## 2020-04-05 ENCOUNTER — Other Ambulatory Visit: Payer: Self-pay | Admitting: Family Medicine

## 2020-04-10 ENCOUNTER — Other Ambulatory Visit: Payer: Self-pay | Admitting: Cardiology

## 2020-04-12 ENCOUNTER — Other Ambulatory Visit: Payer: Self-pay | Admitting: Family Medicine

## 2020-04-12 DIAGNOSIS — Z1231 Encounter for screening mammogram for malignant neoplasm of breast: Secondary | ICD-10-CM

## 2020-06-14 ENCOUNTER — Other Ambulatory Visit: Payer: Self-pay

## 2020-06-14 ENCOUNTER — Telehealth: Payer: Self-pay

## 2020-06-14 ENCOUNTER — Ambulatory Visit (INDEPENDENT_AMBULATORY_CARE_PROVIDER_SITE_OTHER): Payer: 59 | Admitting: Family Medicine

## 2020-06-14 ENCOUNTER — Encounter: Payer: Self-pay | Admitting: Family Medicine

## 2020-06-14 VITALS — BP 88/62 | HR 101 | Ht 59.75 in | Wt 120.8 lb

## 2020-06-14 DIAGNOSIS — S61412A Laceration without foreign body of left hand, initial encounter: Secondary | ICD-10-CM | POA: Diagnosis not present

## 2020-06-14 DIAGNOSIS — E1149 Type 2 diabetes mellitus with other diabetic neurological complication: Secondary | ICD-10-CM | POA: Diagnosis not present

## 2020-06-14 DIAGNOSIS — L03114 Cellulitis of left upper limb: Secondary | ICD-10-CM | POA: Insufficient documentation

## 2020-06-14 MED ORDER — CEFTRIAXONE SODIUM 1 G IJ SOLR
1.0000 g | Freq: Once | INTRAMUSCULAR | Status: AC
  Administered 2020-06-14: 1 g via INTRAMUSCULAR

## 2020-06-14 MED ORDER — DOXYCYCLINE HYCLATE 100 MG PO TABS: 100.0000 mg | ORAL_TABLET | Freq: Two times a day (BID) | ORAL | 0 refills | Status: DC

## 2020-06-14 NOTE — Patient Instructions (Addendum)
Ice area, elevate left hand. Keep covered and apply topical neosporin  Start doxycycline 100 mg twice daily ASAP.  Go to ER if redness spreading, increasing pain or fever on antibitoics

## 2020-06-14 NOTE — Telephone Encounter (Signed)
[place] [facility] Night - Client Nonclinical Telephone Record AccessNurse Client [facility] Night - Client Client Site [place] - Night Physician Eliezer Lofts - MD Contact Type Call Who Is Calling Patient / Member / Family / Caregiver Caller Name [place] Phone Number [phone] Patient Name [name] [name] Patient DOB 04-Apr-1961 Call Type Message Only Information Provided Reason for Call Request to Schedule Office Appointment Initial Comment Caller states she wants to make an appointment for today if possible regarding a cut her hand on fan she received Sunday. This morning it is red and swollen. Additional Comment Please call as soon as possible. Disp. Time Disposition Final User 06/14/2020 7:53:23 AM General Information Provided Yes Clydene Laming, Amy Call Closed By: [place] Lions Transaction Date/Time: 06/14/2020 7:50:09 AM (ET)

## 2020-06-14 NOTE — Progress Notes (Signed)
Chief Complaint  Patient presents with  . Hand Injury    Pt stuck her hand in a fan to clean it and she hit the on switch by accident cutting the top of her hand. top of hand is red, swollen and having severe pain. Pt cannot move fingers on her hand.     History of Present Illness: HPI   59 year old female patient with poorly controlled diabetes presets with laceration from fan on dorsal right hand at base of 2 nd and 3rd digit. She was trying to lean the plastic blade.  Injury occurred occurred on 06/12/2020.  Initially able to move fingers.  Cleaned with antibacteria and soapy water.  Yesterday noted redness, swelling and increasing pain. Gradually increased pain with moving fingers.  She cannot move her fingers in her hand easily given level of pain.   No fever, mildly lightheaded in last few days.  Feels well otherwise, not achy not flu like. She is trying to drink water.  Has iced hand   Now seeing Dr. Denton Lank at [facility], ENDO  Last A1C 02/2020 11.2.. FBS still in 300s. Lab Results  Component Value Date   HGBA1C 15.0 (A) 01/14/2020    No history of MRSA but has history of frequent hospitalizations.   Tdap uptodate 02/11/2020  This visit occurred during the SARS-CoV-2 public health emergency.  Safety protocols were in place, including screening questions prior to the visit, additional usage of staff PPE, and extensive cleaning of exam room while observing appropriate contact time as indicated for disinfecting solutions.   COVID 19 screen:  No recent travel or known exposure to COVID19 The patient denies respiratory symptoms of COVID 19 at this time. The importance of social distancing was discussed today.     Review of Systems  Constitutional: Negative for chills and fever.  HENT: Negative for congestion and ear pain.   Eyes: Negative for pain and redness.  Respiratory: Negative for cough and shortness of breath.   Cardiovascular: Negative for chest pain, palpitations  and leg swelling.  Gastrointestinal: Negative for abdominal pain, blood in stool, constipation, diarrhea, nausea and vomiting.  Genitourinary: Negative for dysuria.  Musculoskeletal: Negative for falls and myalgias.  Skin: Negative for rash.  Neurological: Negative for dizziness.  Psychiatric/Behavioral: Negative for depression. The patient is not nervous/anxious.       Past Medical History:  Diagnosis Date  . Allergy    seasonal  . Anxiety   . B12 deficiency   . Chest pain 04/05/2017  . CHF (congestive heart failure) ([place])   . Diabetes mellitus type 1, uncontrolled ([place])    "dr. just changed me to type 1, uncontrolled" (11/26/2018)  . DKA (diabetic ketoacidoses) ([place]) 05/25/2018  . GERD (gastroesophageal reflux disease)   . Hyperlipidemia   . Hypertension    [name]  . Hypothyroidism   . IBS (irritable bowel syndrome)   . Intermittent vertigo   . 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 Leg cramping    "@ night" (09/18/2013)  . Migraine    "once q couple months" (11/26/2018)  . Osteoporosis   . Peripheral neuropathy   . Umbilical hernia    unrepaired (09/18/2013)    reports that she quit smoking about 29 years ago. Her smoking use included cigarettes. She has a 1.20 pack-year smoking history. She has never used smokeless tobacco. She reports current drug use. Drug: Marijuana. She reports that she does not drink alcohol.   Current Outpatient Medications:  .  acetaminophen (TYLENOL) 325 MG tablet, Take 650 mg by mouth every 6 (six) hours as needed for mild pain, fever or headache., Disp: , Rfl:  .  Continuous Blood Gluc Sensor (FREESTYLE LIBRE 14 DAY SENSOR) MISC, by Does not apply route., Disp: , Rfl:  .  fexofenadine (ALLEGRA) 180 MG tablet, Take 1 tablet (180 mg total) by mouth daily., Disp: 90 tablet, Rfl: 3 .  furosemide (LASIX) 20 MG tablet, Take 1 tablet (20 mg total) by mouth as needed for fluid., Disp: 30 tablet, Rfl: 1 .  insulin lispro  (HUMALOG KWIKPEN) 100 UNIT/ML KiwkPen, Inject into skin 3 times a day at meal time according to the following sliding scale: CBG 70-120 - 0 units / CBG 121-150 - 1 unit / CBG 151-200 - 2 units / CBG 201-250 - 3 units / CBG 251-300 - 5 units / CBG 301-350 - 7 units / CBG 351-400+ - 9 units  YOU MUST FOLLOW A STRICT DIABETIC DIET (Patient taking differently: Inject into the skin See admin instructions. 8 units plus sliding scale Inject into skin 3 times a day at meal time according to the following sliding scale: CBG 70-120 - 0 units / CBG 121-150 - 1 unit / CBG 151-200 - 2 units / CBG 201-250 - 3 units / CBG 251-300 - 5 units / CBG 301-350 - 7 units / CBG 351-400+ - 9 units  YOU MUST FOLLOW A STRICT DIABETIC DIET), Disp: 4 pen, Rfl: 11 .  Insulin Pen Needle (PEN NEEDLES) 31G X 8 MM MISC, Use to inject insulin 4 times a day.  Dx: E11.10, Disp: 130 each, Rfl: 11 .  Lancets (ONETOUCH ULTRASOFT) lancets, Use to check blood sugar daily as needed.  Dx: E11.10, Disp: 100 each, Rfl: 3 .  levothyroxine (SYNTHROID) 150 MCG tablet, TAKE 1 TABLET BY MOUTH EVERY DAY, Disp: 90 tablet, Rfl: 3 .  losartan (COZAAR) 25 MG tablet, Take 1 tablet (25 mg total) by mouth daily., Disp: 90 tablet, Rfl: 1 .  metoCLOPramide (REGLAN) 5 MG tablet, Take 5mg  before breakfast lunch and dinner and 10mg  at bedtime, Disp: 150 tablet, Rfl: 11 .  metoprolol succinate (TOPROL-XL) 25 MG 24 hr tablet, Take 1 tablet (25 mg total) by mouth daily., Disp: 90 tablet, Rfl: 2 .  mupirocin ointment (BACTROBAN) 2 %, Place 1 application into the nose 2 (two) times daily., Disp: 22 g, Rfl: 0 .  nitroGLYCERIN (NITRODUR - DOSED IN MG/24 HR) 0.2 mg/hr patch, APPLY 1 PATCH TO SKIN EVERY DAY AS NEEDED, Disp: 90 patch, Rfl: 1 .  ONETOUCH VERIO test strip, USE TO CHECK BLOOD SUGAR DAILY AS NEEDED. DX: E11.10, Disp: 100 strip, Rfl: 3 .  pantoprazole (PROTONIX) 40 MG tablet, Take 1 tablet (40 mg total) by mouth 2 (two) times daily before a meal., Disp: 30 tablet,  Rfl: 11 .  promethazine (PHENERGAN) 25 MG tablet, TAKE 1 TABLET BY MOUTH EVERY 8 HOURS AS NEEDED FOR NAUSEA AND VOMITING, Disp: 30 tablet, Rfl: 1 .  rosuvastatin (CRESTOR) 40 MG tablet, TAKE 1 TABLET BY MOUTH EVERY DAY, Disp: 90 tablet, Rfl: 3 .  sertraline (ZOLOFT) 100 MG tablet, TAKE 1 TABLET BY MOUTH EVERY DAY, Disp: 90 tablet, Rfl: 1 .  TRESIBA FLEXTOUCH 100 UNIT/ML SOPN FlexTouch Pen, Inject 0.24 mLs (24 Units total) into the skin at bedtime. On 10/20/18 take at lunch time, on 10/21/18 take at dinner time. On 10/22/18 resume at bedtime regimen., Disp: , Rfl: 5   Observations/Objective: Blood pressure (!) 88/62, pulse Marland Kitchen)  101, height 4' 11.75" (1.518 m), weight 120 lb 12.8 oz (54.8 kg), last menstrual period 08/11/2012, SpO2 98 %.  Physical Exam Constitutional:      General: She is not in acute distress.    Appearance: Normal appearance. She is well-developed. She is not ill-appearing or toxic-appearing.  HENT:     Head: Normocephalic.     Right Ear: Hearing, tympanic membrane, ear canal and external ear normal. Tympanic membrane is not erythematous, retracted or bulging.     Left Ear: Hearing, tympanic membrane, ear canal and external ear normal. Tympanic membrane is not erythematous, retracted or bulging.     Nose: No mucosal edema or rhinorrhea.     Right Sinus: No maxillary sinus tenderness or frontal sinus tenderness.     Left Sinus: No maxillary sinus tenderness or frontal sinus tenderness.     Mouth/Throat:     Pharynx: Uvula midline.  Eyes:     General: Lids are normal. Lids are everted, no foreign bodies appreciated.     Conjunctiva/sclera: Conjunctivae normal.     Pupils: Pupils are equal, round, and reactive to light.  Neck:     Thyroid: No thyroid mass or thyromegaly.     Vascular: No carotid bruit.     Trachea: Trachea normal.  Cardiovascular:     Rate and Rhythm: Normal rate and regular rhythm.     Pulses: Normal pulses.     Heart sounds: Normal heart sounds, S1  normal and S2 normal. No murmur heard.  No friction rub. No gallop.   Pulmonary:     Effort: Pulmonary effort is normal. No tachypnea or respiratory distress.     Breath sounds: Normal breath sounds. No decreased breath sounds, wheezing, rhonchi or rales.  Abdominal:     General: Bowel sounds are normal.     Palpations: Abdomen is soft.     Tenderness: There is no abdominal tenderness.  Musculoskeletal:     Cervical back: Normal range of motion and neck supple.  Skin:    General: Skin is warm and dry.     Findings: No rash.     Comments: Left dorsal hand... swelling and erythema over 2nd and 3rd MCP joints and proximally, surrounding closed scabbed laceration 2 cm hand and wrist on left diffuse ttp , no pain in fingers PIP distally Able to extend and flex all fingers partially but with pain, pain with grip  Neurological:     Mental Status: She is alert.  Psychiatric:        Mood and Affect: Mood is not anxious or depressed.        Speech: Speech normal.        Behavior: Behavior normal. Behavior is cooperative.        Thought Content: Thought content normal.        Judgment: Judgment normal.      Assessment and Plan  Laceration of left dorsal hand with resulting cellulitis in patient with poorly controlled diabetes:   Will treat with IM ceftriaxone 1 g x 1. Start doxy 100 mg BID to cover for MRSA given frequent hospitia lvisits in past and possible exposure.  Ice hand, elevate.  Uptodate with tetanus vaccine.   Need better DM control.. increase water intake and  Use insulin as directed by ENDo. Call ENDO if CBGs increasing with local infection  Close follow up in 48 hours.  ER precautions given.  Eliezer Lofts, MD

## 2020-06-14 NOTE — Telephone Encounter (Signed)
Pt already has appt today at 10:40 with Dr Diona Browner.

## 2020-06-14 NOTE — Addendum Note (Signed)
Addended by: Virl Cagey on: 06/14/2020 11:24 AM   Modules accepted: Orders

## 2020-06-16 ENCOUNTER — Ambulatory Visit: Payer: Self-pay | Admitting: Family Medicine

## 2020-06-16 NOTE — Telephone Encounter (Signed)
Pt seen 06/14/20 with Dr Diona Browner.

## 2020-06-17 ENCOUNTER — Other Ambulatory Visit: Payer: Self-pay

## 2020-06-17 ENCOUNTER — Ambulatory Visit (INDEPENDENT_AMBULATORY_CARE_PROVIDER_SITE_OTHER)
Admission: RE | Admit: 2020-06-17 | Discharge: 2020-06-17 | Disposition: A | Discharge status: Home / Self Care | Payer: 59 | Source: Ambulatory Visit | Attending: Family Medicine | Admitting: Family Medicine

## 2020-06-17 ENCOUNTER — Ambulatory Visit (INDEPENDENT_AMBULATORY_CARE_PROVIDER_SITE_OTHER): Payer: 59 | Admitting: Family Medicine

## 2020-06-17 VITALS — BP 90/62 | HR 98 | Temp 98.0°F | Wt 120.2 lb

## 2020-06-17 DIAGNOSIS — L03114 Cellulitis of left upper limb: Secondary | ICD-10-CM

## 2020-06-17 DIAGNOSIS — E1149 Type 2 diabetes mellitus with other diabetic neurological complication: Secondary | ICD-10-CM

## 2020-06-17 DIAGNOSIS — M79672 Pain in left foot: Secondary | ICD-10-CM | POA: Diagnosis not present

## 2020-06-17 NOTE — Assessment & Plan Note (Signed)
Encouraged improved DM control. Follow up with ENDO. Complicated healing.

## 2020-06-17 NOTE — Progress Notes (Signed)
Chief Complaint  Patient presents with   Follow-up    cellulitis of L hand   Toe Injury    2 toes on L foot     History of Present Illness: HPI    59 year old female  with poor control DM presents for 2 days follow up on laceration left dorsal hand with cellulitis.  At last OV on 06/14/20.. started on doxy BID and given rocephin 1 g x 1.  Since then she reports  Less tenderness, no heat, stable redness.  No numbness or weakness in finger.  No fever.  Tolerating antibiotics okay.  FBS stable 300 which is stable for her.. working with ENDO.  Stubbed left 4th and 5th digit toe on bed frame yesterday. Resulting bruising and pain.   This visit occurred during the SARS-CoV-2 public health emergency.  Safety protocols were in place, including screening questions prior to the visit, additional usage of staff PPE, and extensive cleaning of exam room while observing appropriate contact time as indicated for disinfecting solutions.   COVID 19 screen:  No recent travel or known exposure to COVID19 The patient denies respiratory symptoms of COVID 19 at this time. The importance of social distancing was discussed today.     Review of Systems  Constitutional: Negative for chills and fever.  HENT: Negative for congestion and ear pain.   Eyes: Negative for pain and redness.  Respiratory: Negative for cough and shortness of breath.   Cardiovascular: Negative for chest pain, palpitations and leg swelling.  Gastrointestinal: Negative for abdominal pain, blood in stool, constipation, diarrhea, nausea and vomiting.  Genitourinary: Negative for dysuria.  Musculoskeletal: Positive for joint pain. Negative for falls and myalgias.  Skin: Negative for rash.  Neurological: Negative for dizziness.  Psychiatric/Behavioral: Negative for depression. The patient is not nervous/anxious.       Past Medical History:  Diagnosis Date   Allergy    seasonal   Anxiety    B12 deficiency    Chest pain  04/05/2017   CHF (congestive heart failure) ([place])    Diabetes mellitus type 1, uncontrolled ([place])    "dr. just changed me to type 1, uncontrolled" (11/26/2018)   DKA (diabetic ketoacidoses) ([place]) 05/25/2018   GERD (gastroesophageal reflux disease)    Hyperlipidemia    Hypertension    [name]   Hypothyroidism    IBS (irritable bowel syndrome)    Intermittent vertigo    Kidney disease, chronic, stage 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) 10/29/2013   Leg cramping    "@ night" (09/18/2013)   Migraine    "once q couple months" (11/26/2018)   Osteoporosis    Peripheral neuropathy    Umbilical hernia    unrepaired (09/18/2013)    reports that she quit smoking about 29 years ago. Her smoking use included cigarettes. She has a 1.20 pack-year smoking history. She has never used smokeless tobacco. She reports current drug use. Drug: Marijuana. She reports that she does not drink alcohol.   Current Outpatient Medications:    acetaminophen (TYLENOL) 325 MG tablet, Take 650 mg by mouth every 6 (six) hours as needed for mild pain, fever or headache., Disp: , Rfl:    Continuous Blood Gluc Sensor (FREESTYLE LIBRE 14 DAY SENSOR) MISC, by Does not apply route., Disp: , Rfl:    doxycycline (VIBRA-TABS) 100 MG tablet, Take 1 tablet (100 mg total) by mouth 2 (two) times daily., Disp: 20 tablet, Rfl: 0   fexofenadine (ALLEGRA) 180 MG tablet, Take 1 tablet (180 mg  total) by mouth daily., Disp: 90 tablet, Rfl: 3   furosemide (LASIX) 20 MG tablet, Take 1 tablet (20 mg total) by mouth as needed for fluid., Disp: 30 tablet, Rfl: 1   insulin lispro (HUMALOG KWIKPEN) 100 UNIT/ML KiwkPen, Inject into skin 3 times a day at meal time according to the following sliding scale: CBG 70-120 - 0 units / CBG 121-150 - 1 unit / CBG 151-200 - 2 units / CBG 201-250 - 3 units / CBG 251-300 - 5 units / CBG 301-350 - 7 units / CBG 351-400+ - 9 units  YOU MUST FOLLOW A STRICT DIABETIC DIET (Patient taking differently: Inject  into the skin See admin instructions. 8 units plus sliding scale Inject into skin 3 times a day at meal time according to the following sliding scale: CBG 70-120 - 0 units / CBG 121-150 - 1 unit / CBG 151-200 - 2 units / CBG 201-250 - 3 units / CBG 251-300 - 5 units / CBG 301-350 - 7 units / CBG 351-400+ - 9 units  YOU MUST FOLLOW A STRICT DIABETIC DIET), Disp: 4 pen, Rfl: 11   Insulin Pen Needle (PEN NEEDLES) 31G X 8 MM MISC, Use to inject insulin 4 times a day.  Dx: E11.10, Disp: 130 each, Rfl: 11   Lancets (ONETOUCH ULTRASOFT) lancets, Use to check blood sugar daily as needed.  Dx: E11.10, Disp: 100 each, Rfl: 3   levothyroxine (SYNTHROID) 175 MCG tablet, Take 175 mcg by mouth daily., Disp: , Rfl:    losartan (COZAAR) 25 MG tablet, Take 1 tablet (25 mg total) by mouth daily., Disp: 90 tablet, Rfl: 1   metoCLOPramide (REGLAN) 5 MG tablet, Take 5mg  before breakfast lunch and dinner and 10mg  at bedtime, Disp: 150 tablet, Rfl: 11   metoprolol succinate (TOPROL-XL) 25 MG 24 hr tablet, Take 1 tablet (25 mg total) by mouth daily., Disp: 90 tablet, Rfl: 2   mupirocin ointment (BACTROBAN) 2 %, Place 1 application into the nose 2 (two) times daily., Disp: 22 g, Rfl: 0   nitroGLYCERIN (NITRODUR - DOSED IN MG/24 HR) 0.2 mg/hr patch, APPLY 1 PATCH TO SKIN EVERY DAY AS NEEDED, Disp: 90 patch, Rfl: 1   ONETOUCH VERIO test strip, USE TO CHECK BLOOD SUGAR DAILY AS NEEDED. DX: E11.10, Disp: 100 strip, Rfl: 3   pantoprazole (PROTONIX) 40 MG tablet, Take 1 tablet (40 mg total) by mouth 2 (two) times daily before a meal., Disp: 30 tablet, Rfl: 11   promethazine (PHENERGAN) 25 MG tablet, TAKE 1 TABLET BY MOUTH EVERY 8 HOURS AS NEEDED FOR NAUSEA AND VOMITING, Disp: 30 tablet, Rfl: 1   rosuvastatin (CRESTOR) 40 MG tablet, TAKE 1 TABLET BY MOUTH EVERY DAY, Disp: 90 tablet, Rfl: 3   sertraline (ZOLOFT) 100 MG tablet, TAKE 1 TABLET BY MOUTH EVERY DAY, Disp: 90 tablet, Rfl: 1   TRESIBA FLEXTOUCH 100 UNIT/ML SOPN  FlexTouch Pen, Inject 0.24 mLs (24 Units total) into the skin at bedtime. On 10/20/18 take at lunch time, on 10/21/18 take at dinner time. On 10/22/18 resume at bedtime regimen., Disp: , Rfl: 5   Observations/Objective: Blood pressure 90/62, pulse 98, temperature 98 F (36.7 C), temperature source Temporal, weight 120 lb 4 oz (54.5 kg), last menstrual period 08/11/2012, SpO2 98 %.  Physical Exam Constitutional:      General: She is not in acute distress.    Appearance: Normal appearance. She is well-developed. She is not ill-appearing or toxic-appearing.  HENT:     Head: Normocephalic.  Right Ear: Hearing, tympanic membrane, ear canal and external ear normal. Tympanic membrane is not erythematous, retracted or bulging.     Left Ear: Hearing, tympanic membrane, ear canal and external ear normal. Tympanic membrane is not erythematous, retracted or bulging.     Nose: No mucosal edema or rhinorrhea.     Right Sinus: No maxillary sinus tenderness or frontal sinus tenderness.     Left Sinus: No maxillary sinus tenderness or frontal sinus tenderness.     Mouth/Throat:     Pharynx: Uvula midline.  Eyes:     General: Lids are normal. Lids are everted, no foreign bodies appreciated.     Conjunctiva/sclera: Conjunctivae normal.     Pupils: Pupils are equal, round, and reactive to light.  Neck:     Thyroid: No thyroid mass or thyromegaly.     Vascular: No carotid bruit.     Trachea: Trachea normal.  Cardiovascular:     Rate and Rhythm: Normal rate and regular rhythm.     Pulses: Normal pulses.     Heart sounds: Normal heart sounds, S1 normal and S2 normal. No murmur heard.  No friction rub. No gallop.   Pulmonary:     Effort: Pulmonary effort is normal. No tachypnea or respiratory distress.     Breath sounds: Normal breath sounds. No decreased breath sounds, wheezing, rhonchi or rales.  Abdominal:     General: Bowel sounds are normal.     Palpations: Abdomen is soft.     Tenderness:  There is no abdominal tenderness.  Musculoskeletal:     Cervical back: Normal range of motion and neck supple.       Feet:  Feet:     Right foot:     Protective Sensation: 2 sites tested. 0 sites sensed.     Comments:  Contusion at base of 5 toes in left foot, mild swelling ttp of 4th and 5th digit, ttp at 4th and 5th metatarsal Skin:    General: Skin is warm and dry.     Findings: No rash.     Comments: Left dorsal hand... minimal swelling and erythema over 2nd and 3rd MCP joints  Cleaned and healing laceration 2 cm hand   Normal ROM of wrists fingers and normal grip  Neurological:     Mental Status: She is alert.  Psychiatric:        Mood and Affect: Mood is not anxious or depressed.        Speech: Speech normal.        Behavior: Behavior normal. Behavior is cooperative.        Thought Content: Thought content normal.        Judgment: Judgment normal.      Assessment and Plan Left foot pain Possible phalanges or metatarsal fracture. In setting of diabetic with neuropathy... higher risk for complications.  Eval with X-ray.  elevate, ice and tylenol for pain.  Cellulitis of left hand Improving with doxy.. complete course. No red flags or indication for referral to hand specialist. Significant improvement in motion of hand.  Type 2 diabetes mellitus with neurological complications ([place]) Encouraged improved DM control. Follow up with ENDO. Complicated healing.       Eliezer Lofts, MD

## 2020-06-17 NOTE — Assessment & Plan Note (Signed)
Improving with doxy.. complete course. No red flags or indication for referral to hand specialist. Significant improvement in motion of hand.

## 2020-06-17 NOTE — Patient Instructions (Addendum)
Complete doxycycline course.  Work on sugar control with endocrinologist.  Call if hand issue not resolving, fever or redness spreading.  We will call with X-ray results.  Ice and elevate foot. Limit weight bearing.

## 2020-06-17 NOTE — Assessment & Plan Note (Signed)
Possible phalanges or metatarsal fracture. In setting of diabetic with neuropathy... higher risk for complications.  Eval with X-ray.  elevate, ice and tylenol for pain.

## 2020-06-20 ENCOUNTER — Telehealth: Payer: Self-pay | Admitting: *Deleted

## 2020-06-20 NOTE — Telephone Encounter (Signed)
Spoke with Charla and explained how to buddy tape her toes.  We also discussed wearing wide shoes and she mentioned she had ask her friends if anybody had a boot (cam walker) she could use.  I advised a post op shoe would probably be better.  I told her we had them but they were $30 up front and that she could probably get one cheaper a her pharmacy.  Patient will like to come to office instead and pay for post op shoe here.  She will stop by office today to pick on up.  FYI to Dr. Diona Browner.

## 2020-06-20 NOTE — Telephone Encounter (Signed)
Patient left a voicemail stating that she saw on my chart that she has a fractured toe. Patient stated that she needs to tape her toes and does not know how to do that. Patient requested a call back to let her know what she needs to do.

## 2020-06-21 ENCOUNTER — Telehealth: Payer: Self-pay | Admitting: Cardiology

## 2020-06-21 NOTE — Telephone Encounter (Signed)
Pt c/o of Chest Pain: STAT if CP now or developed within 24 hours  1. Are you having CP right now? Patient states she has been having chest discomfort, not having chest discomfort right now.   2. Are you experiencing any other symptoms (ex. SOB, nausea, vomiting, sweating)? Lightheadedness, BP issues, and some nausea. Patient stated she checked her BP this morning before calling the office and it was 98/68.   3. How long have you been experiencing CP? The chest discomfort has been going on since her heart issues started which is why she has the nitroglycerin patches. I asked the patient is if it intensified within the last week or so, patient denied. She stated that since the lightheadedness started, she decided to call.  4. Is your CP continuous or coming and going? It stays until she takes the nitro and puts the nitro patch on.   5. Have you taken Nitroglycerin? Took asprin and put nitroglycerin patch on before calling the office.

## 2020-06-21 NOTE — Telephone Encounter (Signed)
Called patient- she states that she has been having issues with her BP becoming to low- she gives numbers from her office visits last week 88/68- 90/68, and even at home been running 98/68- patient states she would not think anything of it but she has been lightheaded, and states that she uses the nitroglycerin patches often which is what she was told to do, not use as needed. She states she has been changing them out and once that happens everything feels better- but advised this could be dropping her BP as well, and we should make med adjustments. I did advise we did not have any in office visits but she could do a virtual visit and speak with a NP on how best to proceed. Did advise with patient if anything was to worsen or the BP was to drop any lower to go to ER. Patient verbalized and states if she is unable to do virtual she will go to hospital to be evaluated.   Patient is just concerned with her BP running low and would like to discuss.  Appt made with NP.

## 2020-06-22 NOTE — Progress Notes (Signed)
Virtual Visit via Telephone Note   This visit type was conducted due to national recommendations for restrictions regarding the COVID-19 Pandemic (e.g. social distancing) in an effort to limit this patient's exposure and mitigate transmission in our community.  Due to her co-morbid illnesses, this patient is at least at moderate risk for complications without adequate follow up.  This format is felt to be most appropriate for this patient at this time.  The patient did not have access to video technology/had technical difficulties with video requiring transitioning to audio format only (telephone).  All issues noted in this document were discussed and addressed.  No physical exam could be performed with this format.  Please refer to the patient's chart for her  consent to telehealth for [facility].   Date:  06/23/2020   ID:  [name] [name], DOB 10-Aug-1961, MRN [id number]  Patient Location: Home Provider Location: Home Office  PCP:  Jinny Sanders, MD  Cardiologist:  Minus Breeding, MD  Electrophysiologist:  None   Evaluation Performed:  Follow-Up Visit  Chief Complaint: Hypotension  History of Present Illness:    [name] [name] is a 59 y.o. female we are seeing you via virtual encounter, for ongoing assessment and management of atrial fibrillation.  She had a work-up with a stress test which was abnormal however she had normal coronaries on catheterization in 2012.  Repeat echocardiogram in 2018 revealed an EF of 35 to 40%.  Repeat cardiac catheterization demonstrated normal coronaries.  She was noted to have very poorly controlled diabetes.  She has been hospitalized on several occasions due to DKA.  She has a history of medical noncompliance.  When last seen by Dr. Percival Spanish on 10/13/2019 she was not found to be volume overloaded, she was not complaining of any discomfort.  Blood pressure was controlled.  He did note that her hemoglobin A1c was 11.5, and was noted to be a patient of an  endocrinologist.  She had no recurrent symptoms which she could report concerning irregular heart rate or atrial fib.  She was planned to have an OSA study however she did not show for the appointment.  He did not reorder this.  The patient called our office on 06/21/2020 with complaints of blood pressure being too low.  She reported blood pressures of 88/68 90/68 and 98/68.  She complained of associated lightheadedness.  She also wears a nitroglycerin patch which is worn daily, she reported that she changes them out when she begins to have some symptoms and everything feels better.  She was advised that nitroglycerin patches could be dropping her blood pressure.  She was advised to go to ED if her blood pressure became very low and she was asked to take off the nitroglycerin patches.  She was seen by PCP have injuring her hand and fx her toe. She was found to have low BP. PCP did not make any changes in her medications. She continues to have falling episodes due to unsteadiness on her feet. She has not been wearing NTG patch consistently.  The patient does not have symptoms concerning for COVID-19 infection (fever, chills, cough, or new shortness of breath).    Past Medical History:  Diagnosis Date  . Allergy    seasonal  . Anxiety   . B12 deficiency   . Chest pain 04/05/2017  . CHF (congestive heart failure) ([place])   . Diabetes mellitus type 1, uncontrolled ([place])    "dr. just changed me to type 1, uncontrolled" (  11/26/2018)  . DKA (diabetic ketoacidoses) ([place]) 05/25/2018  . GERD (gastroesophageal reflux disease)   . Hyperlipidemia   . Hypertension    [name]  . Hypothyroidism   . IBS (irritable bowel syndrome)   . Intermittent vertigo   . 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 Leg cramping    "@ night" (09/18/2013)  . Migraine    "once q couple months" (11/26/2018)  . Osteoporosis   . Peripheral neuropathy   . Umbilical hernia    unrepaired (09/18/2013)    Past Surgical History:  Procedure Laterality Date  . BIOPSY  06/03/2019   Procedure: BIOPSY;  Surgeon: Jackquline Denmark, MD;  Location: [facility];  Service: Endoscopy;;  . [place]  . ENTEROSCOPY N/A 06/03/2019   Procedure: ENTEROSCOPY;  Surgeon: Jackquline Denmark, MD;  Location: [facility];  Service: Endoscopy;  Laterality: N/A;  . LAPAROSCOPIC ASSISTED VAGINAL HYSTERECTOMY  09/23/2012   Procedure: LAPAROSCOPIC ASSISTED VAGINAL HYSTERECTOMY;  Surgeon: Linda Hedges, DO;  Location: [facility];  Service: Gynecology;  Laterality: N/A;  pull Dr Gregor Hams instrument  . LEFT HEART CATH AND CORONARY ANGIOGRAPHY N/A 02/11/2017   Procedure: Left Heart Cath and Coronary Angiography;  Surgeon: Lorretta Harp, MD;  Location: [facility];  Service: Cardiovascular;  Laterality: N/A;  . TUBAL LIGATION Bilateral 1992     Current Meds  Medication Sig  . acetaminophen (TYLENOL) 325 MG tablet Take 650 mg by mouth every 6 (six) hours as needed for mild pain, fever or headache.  . doxycycline (VIBRA-TABS) 100 MG tablet Take 1 tablet (100 mg total) by mouth 2 (two) times daily.  . fexofenadine (ALLEGRA) 180 MG tablet Take 1 tablet (180 mg total) by mouth daily.  . insulin lispro (HUMALOG KWIKPEN) 100 UNIT/ML KiwkPen Inject into skin 3 times a day at meal time according to the following sliding scale: CBG 70-120 - 0 units / CBG 121-150 - 1 unit / CBG 151-200 - 2 units / CBG 201-250 - 3 units / CBG 251-300 - 5 units / CBG 301-350 - 7 units / CBG 351-400+ - 9 units  YOU MUST FOLLOW A STRICT DIABETIC DIET (Patient taking differently: Inject into the skin See admin instructions. 8 units plus sliding scale Inject into skin 3 times a day at meal time according to the following sliding scale: CBG 70-120 - 0 units / CBG 121-150 - 1 unit / CBG 151-200 - 2 units / CBG 201-250 - 3 units / CBG 251-300 - 5 units / CBG 301-350 - 7 units / CBG 351-400+ - 9 units  YOU MUST FOLLOW A STRICT DIABETIC DIET)   . Insulin Pen Needle (PEN NEEDLES) 31G X 8 MM MISC Use to inject insulin 4 times a day.  Dx: E11.10  . Lancets (ONETOUCH ULTRASOFT) lancets Use to check blood sugar daily as needed.  Dx: E11.10  . levothyroxine (SYNTHROID) 175 MCG tablet Take 175 mcg by mouth daily.  . metoCLOPramide (REGLAN) 5 MG tablet Take 5mg  before breakfast lunch and dinner and 10mg  at bedtime  . metoprolol succinate (TOPROL-XL) 25 MG 24 hr tablet Take 1 tablet (25 mg total) by mouth daily.  Glory Rosebush VERIO test strip USE TO CHECK BLOOD SUGAR DAILY AS NEEDED. DX: E11.10  . pantoprazole (PROTONIX) 40 MG tablet Take 1 tablet (40 mg total) by mouth 2 (two) times daily before a meal.  . promethazine (PHENERGAN) 25 MG tablet TAKE 1 TABLET BY MOUTH EVERY 8 HOURS AS NEEDED FOR  NAUSEA AND VOMITING  . rosuvastatin (CRESTOR) 40 MG tablet TAKE 1 TABLET BY MOUTH EVERY DAY  . sertraline (ZOLOFT) 100 MG tablet TAKE 1 TABLET BY MOUTH EVERY DAY  . TRESIBA FLEXTOUCH 100 UNIT/ML SOPN FlexTouch Pen Inject 0.24 mLs (24 Units total) into the skin at bedtime. On 10/20/18 take at lunch time, on 10/21/18 take at dinner time. On 10/22/18 resume at bedtime regimen.  . [DISCONTINUED] furosemide (LASIX) 20 MG tablet Take 1 tablet (20 mg total) by mouth as needed for fluid.  . [DISCONTINUED] losartan (COZAAR) 25 MG tablet Take 1 tablet (25 mg total) by mouth daily.  . [DISCONTINUED] nitroGLYCERIN (NITRODUR - DOSED IN MG/24 HR) 0.2 mg/hr patch APPLY 1 PATCH TO SKIN EVERY DAY AS NEEDED     Allergies:   Codeine   Social History   Tobacco Use  . Smoking status: Former Smoker    Packs/day: 0.12    Years: 10.00    Pack years: 1.20    Types: Cigarettes    Quit date: 12/10/1990    Years since quitting: 29.5  . Smokeless tobacco: Never Used  Vaping Use  . Vaping Use: Never used  Substance Use Topics  . Alcohol use: Never    Alcohol/week: 0.0 standard drinks  . Drug use: Yes    Types: Marijuana    Comment: last use 2 days ago     Family  Hx: The patient's family history includes Alzheimer's disease in her maternal grandmother; Coronary artery disease in her maternal grandmother; Diabetes in her brother, maternal grandmother, and another family member; Heart attack in her maternal grandfather; Heart disease in an other family member; Heart failure in her brother; Heart failure (age of onset: 37) in her mother. There is no history of Colon polyps, Esophageal cancer, Liver cancer, Pancreatic cancer, Stomach cancer, Crohn's disease, or Rectal cancer.  ROS:   Please see the history of present illness.    All other systems reviewed and are negative.   Prior CV studies:   The following studies were reviewed today:  Echocardiogram 01/13/2019 1. The left ventricle has normal systolic function of 70-26%. The cavity  size is normal. There is normal left ventricular wall thickness. The left  ventricular diastology could not be evaluated due to nondiagnostic images.  2. Normal left atrial size.  3. In comparison to the previous echocardiogram(s): The left ventricular  function has improved.   Labs/Other Tests and Data Reviewed:    EKG:  No ECG reviewed.  Recent Labs: 02/04/2020: Magnesium 1.8   Recent Lipid Panel Lab Results  Component Value Date/Time   CHOL 197 01/27/2019 08:09 AM   TRIG 148.0 01/27/2019 08:09 AM   HDL 43.10 01/27/2019 08:09 AM   CHOLHDL 5 01/27/2019 08:09 AM   LDLCALC 124 (H) 01/27/2019 08:09 AM   LDLDIRECT 206.0 07/27/2014 07:45 AM    Wt Readings from Last 3 Encounters:  06/23/20 120 lb 6.4 oz (54.6 kg)  06/17/20 120 lb 4 oz (54.5 kg)  06/14/20 120 lb 12.8 oz (54.8 kg)     Objective:    Vital Signs:  BP (!) 124/112   Pulse (!) 108   Ht 4\' 11"  (1.499 m)   Wt 120 lb 6.4 oz (54.6 kg)   LMP 08/11/2012   BMI 24.32 kg/m   Limited due to telephone visit.   VITAL SIGNS:  reviewed NEURO:   PSYCH:    ASSESSMENT & PLAN:    1. Hypotension:  She endorses near syncope and frequent dizziness, with  unsteadiness on her feet associated with low BP. She has had readings as low as 87/60. Was seen in PCP office for injury to hand and foot and was also noted to have hypotension. I will stop losartan for now. She is to keep up with BP and record it daily. Only to take it at the same time every day, once a day. She will be seen on follow up in 2-3 weeks to evaluate her response to medication changes. She will likely need to have repeat echo for evaluation of LV function. EF was normal in 01/13/2019.  2. Chronic chest pain: I have asked her not to wear NTG patches or take doses of lasix unless absolutely necessary to avoid hypotension.  3.  Frequent falls. Uncertain if this is related to hypotension. She states that she is often unsteady on her feet. She now has a walking cast which is difficult for her, and sometimes gets caught in blankets etc. Follow up with PCP about this.   4. Diabetes: Difficult to control. She is working with her endocrinologist to be on adequate medication control.    COVID-19 Education: The signs and symptoms of COVID-19 were discussed with the patient and how to seek care for testing (follow up with PCP or arrange E-visit).  The importance of social distancing was discussed today.  Time:   Today, I have spent 30 minutes with the patient with telehealth technology discussing the above problems as well as chart review and documentation..     Medication Adjustments/Labs and Tests Ordered: Current medicines are reviewed at length with the patient today.  Concerns regarding medicines are outlined above.   Tests Ordered: Orders Placed This Encounter  Procedures  . CBC  . Basic Metabolic Panel (BMET)    Medication Changes: No orders of the defined types were placed in this encounter.   Disposition:  Follow up 2-3 weeks.   Signed, Phill Myron. [place] P[place], ANP, AACC  06/23/2020 12:38 PM    [facility]

## 2020-06-23 ENCOUNTER — Encounter: Payer: Self-pay | Admitting: Adult Health

## 2020-06-23 ENCOUNTER — Telehealth (INDEPENDENT_AMBULATORY_CARE_PROVIDER_SITE_OTHER): Payer: 59 | Admitting: Adult Health

## 2020-06-23 VITALS — BP 124/112 | HR 108 | Ht 59.0 in | Wt 120.4 lb

## 2020-06-23 DIAGNOSIS — Z794 Long term (current) use of insulin: Secondary | ICD-10-CM

## 2020-06-23 DIAGNOSIS — Z79899 Other long term (current) drug therapy: Secondary | ICD-10-CM

## 2020-06-23 DIAGNOSIS — I952 Hypotension due to drugs: Secondary | ICD-10-CM

## 2020-06-23 DIAGNOSIS — E118 Type 2 diabetes mellitus with unspecified complications: Secondary | ICD-10-CM

## 2020-06-23 DIAGNOSIS — R0789 Other chest pain: Secondary | ICD-10-CM | POA: Diagnosis not present

## 2020-06-23 NOTE — Patient Instructions (Addendum)
Medication Instructions:  STOP- Losartan STOP- Furosemide(Lasix) STOP- Nitroglycerin Patch  *If you need a refill on your cardiac medications before your next appointment, please call your pharmacy*   Lab Work: CBC and BMP   If you have labs (blood work) drawn today and your tests are completely normal, you will receive your results only by: Marland Kitchen MyChart Message (if you have MyChart) OR . A paper copy in the mail If you have any lab test that is abnormal or we need to change your treatment, we will call you to review the results.   Testing/Procedures: None Ordered   Follow-Up: At [facility], you and your health needs are our priority.  As part of our continuing mission to provide you with exceptional heart care, we have created designated Provider Care Teams.  These Care Teams include your primary Cardiologist (physician) and Advanced Practice Providers (APPs -  Physician Assistants and Nurse Practitioners) who all work together to provide you with the care you need, when you need it.  We recommend signing up for the patient portal called "MyChart".  Sign up information is provided on this After Visit Summary.  MyChart is used to connect with patients for Virtual Visits (Telemedicine).  Patients are able to view lab/test results, encounter notes, upcoming appointments, etc.  Non-urgent messages can be sent to your provider as well.   To learn more about what you can do with MyChart, go to [email or web address].    Your next appointment:   August 6th @ 9:15 am

## 2020-07-12 LAB — BASIC METABOLIC PANEL
BUN/Creatinine Ratio: 15 (ref 9–23)
BUN: 11 mg/dL (ref 6–24)
CO2: 24 mmol/L (ref 20–29)
Calcium: 9.5 mg/dL (ref 8.7–10.2)
Chloride: 93 mmol/L — ABNORMAL LOW (ref 96–106)
Creatinine, Ser: 0.74 mg/dL (ref 0.57–1.00)
GFR calc Af Amer: 104 mL/min/{1.73_m2} (ref >59)
GFR calc non Af Amer: 90 mL/min/{1.73_m2} (ref >59)
Glucose: 373 mg/dL — ABNORMAL HIGH (ref 65–99)
Potassium: 4.6 mmol/L (ref 3.5–5.2)
Sodium: 132 mmol/L — ABNORMAL LOW (ref 134–144)

## 2020-07-12 LAB — CBC
Hematocrit: 48.3 % — ABNORMAL HIGH (ref 34.0–46.6)
Hemoglobin: 15.7 g/dL (ref 11.1–15.9)
MCH: 29 pg (ref 26.6–33.0)
MCHC: 32.5 g/dL (ref 31.5–35.7)
MCV: 89 fL (ref 79–97)
Platelets: 318 10*3/uL (ref 150–450)
RBC: 5.41 x10E6/uL — ABNORMAL HIGH (ref 3.77–5.28)
RDW: 13.7 % (ref 11.7–15.4)
WBC: 10.5 10*3/uL (ref 3.4–10.8)

## 2020-07-14 NOTE — Progress Notes (Signed)
Cardiology Office Note   Date:  07/15/2020   ID:  [name] [name], DOB 05-12-1961, MRN [id number]  PCP:  Jinny Sanders, MD  Cardiologist: Dr.Hochrein  CC: Follow Up Hypotension    History of Present Illness: [name] [name] is a 59 y.o. female who presents for ongoing assessment and management of atrial fibrillation.  She had a work-up with a stress test which was abnormal however she had normal coronaries on catheterization in 2012.  Repeat echocardiogram in 2018 revealed an EF of 35 to 40%.  Repeat cardiac catheterization demonstrated normal coronaries.  She was noted to have very poorly controlled diabetes.  She has been hospitalized on several occasions due to DKA.  She has a history of medical noncompliance.  The patient called our office on 06/21/2020 with complaints of blood pressure being too low.  She reported blood pressures of 88/68 90/68 and 98/68.  She complained of associated lightheadedness.  She also wears a nitroglycerin patch which is worn daily, she reported that she changes them out when she begins to have some symptoms and everything feels better.  She was advised that nitroglycerin patches could be dropping her blood pressure.  She was advised to go to ED if her blood pressure became very low and she was asked to take off the nitroglycerin patches.  She was seen by PCP have injuring her hand and fx her toe. She was found to have low BP. PCP did not make any changes in her medications. She continues to have falling episodes due to unsteadiness on her feet. She has not been wearing NTG patch consistently.  She was not cleared for surgery to have her fx toe repaired. Losartan and NTG were stopped due to hypotension.   She states she no longer will be undergoing surgery to repair her left foot.  She states that she is followed by orthopedics he did not feel it was necessary to proceed with surgery.  She brings with her a list of her blood pressures off of losartan and  nitroglycerin.  Blood pressures have been running between 88/78 to as high as 110/65.  Heart rates have been ranging between 99 and 110 bpm.  She feels fatigued, mildly dyspneic with exertion.  She expresses that she has had a lot of stress in her life over the last 3 years which has worsened her overall feeling of health.  Past Medical History:  Diagnosis Date  . Allergy    seasonal  . Anxiety   . B12 deficiency   . Chest pain 04/05/2017  . CHF (congestive heart failure) ([place])   . Diabetes mellitus type 1, uncontrolled ([place])    "dr. just changed me to type 1, uncontrolled" (11/26/2018)  . DKA (diabetic ketoacidoses) ([place]) 05/25/2018  . GERD (gastroesophageal reflux disease)   . Hyperlipidemia   . Hypertension    [name]  . Hypothyroidism   . IBS (irritable bowel syndrome)   . Intermittent vertigo   . 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 Leg cramping    "@ night" (09/18/2013)  . Migraine    "once q couple months" (11/26/2018)  . Osteoporosis   . Peripheral neuropathy   . Umbilical hernia    unrepaired (09/18/2013)    Past Surgical History:  Procedure Laterality Date  . BIOPSY  06/03/2019   Procedure: BIOPSY;  Surgeon: Jackquline Denmark, MD;  Location: [facility];  Service: Endoscopy;;  . [place]  . ENTEROSCOPY N/A 06/03/2019  Procedure: ENTEROSCOPY;  Surgeon: Jackquline Denmark, MD;  Location: [facility];  Service: Endoscopy;  Laterality: N/A;  . LAPAROSCOPIC ASSISTED VAGINAL HYSTERECTOMY  09/23/2012   Procedure: LAPAROSCOPIC ASSISTED VAGINAL HYSTERECTOMY;  Surgeon: Linda Hedges, DO;  Location: [facility];  Service: Gynecology;  Laterality: N/A;  pull Dr Gregor Hams instrument  . LEFT HEART CATH AND CORONARY ANGIOGRAPHY N/A 02/11/2017   Procedure: Left Heart Cath and Coronary Angiography;  Surgeon: Lorretta Harp, MD;  Location: [facility];  Service: Cardiovascular;  Laterality: N/A;  . TUBAL LIGATION Bilateral 1992     Current  Outpatient Medications  Medication Sig Dispense Refill  . acetaminophen (TYLENOL) 325 MG tablet Take 650 mg by mouth every 6 (six) hours as needed for mild pain, fever or headache.    . Continuous Blood Gluc Sensor (FREESTYLE LIBRE 14 DAY SENSOR) MISC by Does not apply route.    . fexofenadine (ALLEGRA) 180 MG tablet Take 1 tablet (180 mg total) by mouth daily. 90 tablet 3  . gabapentin (NEURONTIN) 300 MG capsule Take 300 mg by mouth at bedtime as needed.    . insulin lispro (HUMALOG KWIKPEN) 100 UNIT/ML KiwkPen Inject into skin 3 times a day at meal time according to the following sliding scale: CBG 70-120 - 0 units / CBG 121-150 - 1 unit / CBG 151-200 - 2 units / CBG 201-250 - 3 units / CBG 251-300 - 5 units / CBG 301-350 - 7 units / CBG 351-400+ - 9 units  YOU MUST FOLLOW A STRICT DIABETIC DIET (Patient taking differently: Inject into the skin See admin instructions. 8 units plus sliding scale Inject into skin 3 times a day at meal time according to the following sliding scale: CBG 70-120 - 0 units / CBG 121-150 - 1 unit / CBG 151-200 - 2 units / CBG 201-250 - 3 units / CBG 251-300 - 5 units / CBG 301-350 - 7 units / CBG 351-400+ - 9 units  YOU MUST FOLLOW A STRICT DIABETIC DIET) 4 pen 11  . Insulin Pen Needle (PEN NEEDLES) 31G X 8 MM MISC Use to inject insulin 4 times a day.  Dx: E11.10 130 each 11  . Lancets (ONETOUCH ULTRASOFT) lancets Use to check blood sugar daily as needed.  Dx: E11.10 100 each 3  . levothyroxine (SYNTHROID) 175 MCG tablet Take 175 mcg by mouth daily.    . metoCLOPramide (REGLAN) 5 MG tablet Take 5mg  before breakfast lunch and dinner and 10mg  at bedtime 150 tablet 11  . metoprolol succinate (TOPROL-XL) 25 MG 24 hr tablet Take 1 tablet (25 mg total) by mouth daily. 90 tablet 2  . ONETOUCH VERIO test strip USE TO CHECK BLOOD SUGAR DAILY AS NEEDED. DX: E11.10 100 strip 3  . pantoprazole (PROTONIX) 40 MG tablet Take 1 tablet (40 mg total) by mouth 2 (two) times  daily before a meal. 30 tablet 11  . promethazine (PHENERGAN) 25 MG tablet TAKE 1 TABLET BY MOUTH EVERY 8 HOURS AS NEEDED FOR NAUSEA AND VOMITING 30 tablet 1  . rosuvastatin (CRESTOR) 40 MG tablet TAKE 1 TABLET BY MOUTH EVERY DAY 90 tablet 3  . sertraline (ZOLOFT) 100 MG tablet TAKE 1 TABLET BY MOUTH EVERY DAY 90 tablet 1  . TRESIBA FLEXTOUCH 100 UNIT/ML SOPN FlexTouch Pen Inject 0.24 mLs (24 Units total) into the skin at bedtime. On 10/20/18 take at lunch time, on 10/21/18 take at dinner time. On 10/22/18 resume at bedtime regimen.  5   No current facility-administered medications  for this visit.    Allergies:   Codeine    Social History:  The patient  reports that she quit smoking about 29 years ago. Her smoking use included cigarettes. She has a 1.20 pack-year smoking history. She has never used smokeless tobacco. She reports current drug use. Drug: Marijuana. She reports that she does not drink alcohol.   Family History:  The patient's family history includes Alzheimer's disease in her maternal grandmother; Coronary artery disease in her maternal grandmother; Diabetes in her brother, maternal grandmother, and another family member; Heart attack in her maternal grandfather; Heart disease in an other family member; Heart failure in her brother; Heart failure (age of onset: 73) in her mother.    ROS: All other systems are reviewed and negative. Unless otherwise mentioned in H&P    PHYSICAL EXAM: VS:  BP 90/62   Pulse 94   Ht 4\' 11"  (1.499 m)   Wt 117 lb 9.6 oz (53.3 kg)   LMP 08/11/2012   BMI 23.75 kg/m  , BMI Body mass index is 23.75 kg/m. GEN: Well nourished, well developed, in no acute distress.  Frail HEENT: normal Neck: no JVD, carotid bruits, or masses Cardiac: RRR; no murmurs, rubs, or gallops,no edema  Respiratory:  Clear to auscultation bilaterally, normal work of breathing GI: soft, nontender, nondistended, + BS MS: no deformity or atrophy Skin: warm and dry, no  rash Neuro:  Strength and sensation are intact Psych: euthymic mood, full affect   EKG: Normal sinus rhythm, rightward axis, ventricular rate of 94 bpm.  (Personally reviewed  Recent Labs: 02/04/2020: Magnesium 1.8 07/11/2020: BUN 11; Creatinine, Ser 0.74; Hemoglobin 15.7; Platelets 318; Potassium 4.6; Sodium 132    Lipid Panel    Component Value Date/Time   CHOL 197 01/27/2019 0809   TRIG 148.0 01/27/2019 0809   HDL 43.10 01/27/2019 0809   CHOLHDL 5 01/27/2019 0809   VLDL 29.6 01/27/2019 0809   LDLCALC 124 (H) 01/27/2019 0809   LDLDIRECT 206.0 07/27/2014 0745      Wt Readings from Last 3 Encounters:  07/15/20 117 lb 9.6 oz (53.3 kg)  06/23/20 120 lb 6.4 oz (54.6 kg)  06/17/20 120 lb 4 oz (54.5 kg)      Other studies Reviewed: Echocardiogram February 12, 2019  1. The left ventricle has normal systolic function of 94-85%. The cavity  size is normal. There is normal left ventricular wall thickness. The left  ventricular diastology could not be evaluated due to nondiagnostic images.  2. Normal left atrial size.  3. In comparison to the previous echocardiogram(s): The left ventricular  function has improved.   NM Stress Test: 04/25/2018     The left ventricular ejection fraction is hyperdynamic (>65%).  Nuclear stress EF: 66%.  There was no ST segment deviation noted during stress.  The study is normal.   1. EF 66%, normal wall motion.  2. No evidence for ischemia or infarction on perfusion images.   Normal study.    ASSESSMENT AND PLAN:  1. Hypotension: Still low but apparently this is normal for her.  She is not on any medications which would decrease her blood pressure with exception of metoprolol succinate 25 mg daily.  I do not want to decrease it as her heart rate remains elevated in the 90s.  We will have to allow for a lower blood pressure for now as she is asymptomatic concerning dizziness or near syncope.  2.  Ongoing chest pressure: She is under a lot of  stress which  I think may be precipitating some of her symptoms.  The patient's most recent cardiac catheterization and 2018 revealed normal coronary arteries.  I do not think her discomfort is related to this.  She would like to have another catheterization as has been 3 years with history of diabetes.  I would like to meet her halfway and have a nuclear medicine stress test instead to evaluate for ischemia.  If this is positive then would have reason to proceed with cath.  3.  Insulin-dependent diabetes: Followed by PCP.  4.  Hyperlipidemia: Labs are followed by PCP.  If not completed on follow-up visit will need to have these drawn.  Goal of LDL less than 100.   Current medicines are reviewed at length with the patient today.  I have spent 25 minutes dedicated to the care of this patient on the date of this encounter to include pre-visit review of records, assessment, management and diagnostic testing,with shared decision making.  Labs/ tests ordered today include: Lexiscan stress test  Phill Myron. West Pugh, ANP, AACC   07/15/2020 11:51 AM    [facility] [place] [address] Office [phone] Fax [phone]  Notice: This dictation was prepared with Dragon dictation along with smaller phrase technology. Any transcriptional errors that result from this process are unintentional and may not be corrected upon review.

## 2020-07-15 ENCOUNTER — Other Ambulatory Visit: Payer: Self-pay

## 2020-07-15 ENCOUNTER — Encounter: Payer: Self-pay | Admitting: Cardiology

## 2020-07-15 ENCOUNTER — Ambulatory Visit: Payer: 59 | Admitting: Adult Health

## 2020-07-15 ENCOUNTER — Encounter: Payer: Self-pay | Admitting: Adult Health

## 2020-07-15 ENCOUNTER — Telehealth (HOSPITAL_COMMUNITY): Payer: Self-pay

## 2020-07-15 VITALS — BP 90/62 | HR 94 | Ht 59.0 in | Wt 117.6 lb

## 2020-07-15 DIAGNOSIS — I959 Hypotension, unspecified: Secondary | ICD-10-CM | POA: Diagnosis not present

## 2020-07-15 DIAGNOSIS — R000 Tachycardia, unspecified: Secondary | ICD-10-CM | POA: Diagnosis not present

## 2020-07-15 DIAGNOSIS — R0789 Other chest pain: Secondary | ICD-10-CM | POA: Diagnosis not present

## 2020-07-15 NOTE — Telephone Encounter (Signed)
Encounter complete. 

## 2020-07-15 NOTE — Patient Instructions (Signed)
Medication Instructions:  Continue current medications  *If you need a refill on your cardiac medications before your next appointment, please call your pharmacy*   Lab Work: None Ordered   Testing/Procedures: Your physician has requested that you have a lexiscan myoview. For further information please visit [email or web address]. Please follow instruction sheet, as given.  Follow-Up: At [facility], you and your health needs are our priority.  As part of our continuing mission to provide you with exceptional heart care, we have created designated Provider Care Teams.  These Care Teams include your primary Cardiologist (physician) and Advanced Practice Providers (APPs -  Physician Assistants and Nurse Practitioners) who all work together to provide you with the care you need, when you need it.  We recommend signing up for the patient portal called "MyChart".  Sign up information is provided on this After Visit Summary.  MyChart is used to connect with patients for Virtual Visits (Telemedicine).  Patients are able to view lab/test results, encounter notes, upcoming appointments, etc.  Non-urgent messages can be sent to your provider as well.   To learn more about what you can do with MyChart, go to [email or web address].    Your next appointment:   1 month(s)  The format for your next appointment:   In Person  Provider:   Minus Breeding, MD

## 2020-07-19 NOTE — Addendum Note (Signed)
Addended by: Vennie Homans on: 07/19/2020 11:50 AM   Modules accepted: Orders

## 2020-07-20 ENCOUNTER — Ambulatory Visit (HOSPITAL_COMMUNITY)
Admission: RE | Admit: 2020-07-20 | Discharge: 2020-07-20 | Disposition: A | Discharge status: Home / Self Care | Payer: 59 | Source: Ambulatory Visit | Attending: Cardiology | Admitting: Cardiology

## 2020-07-20 ENCOUNTER — Other Ambulatory Visit: Payer: Self-pay

## 2020-07-20 DIAGNOSIS — R0789 Other chest pain: Secondary | ICD-10-CM | POA: Diagnosis present

## 2020-07-20 LAB — MYOCARDIAL PERFUSION IMAGING
LV dias vol: 42 mL (ref 46–106)
LV sys vol: 15 mL
Peak HR: 115 {beats}/min
Rest HR: 91 {beats}/min
SDS: 7
SRS: 2
SSS: 9
TID: 0.81

## 2020-07-20 MED ORDER — TECHNETIUM TC 99M TETROFOSMIN IV KIT
10.5000 | PACK | Freq: Once | INTRAVENOUS | Status: AC | PRN
  Administered 2020-07-20: 10.5 via INTRAVENOUS
  Filled 2020-07-20: qty 11

## 2020-07-20 MED ORDER — REGADENOSON 0.4 MG/5ML IV SOLN
0.4000 mg | Freq: Once | INTRAVENOUS | Status: AC
  Administered 2020-07-20: 0.4 mg via INTRAVENOUS

## 2020-07-20 MED ORDER — TECHNETIUM TC 99M TETROFOSMIN IV KIT
31.2000 | PACK | Freq: Once | INTRAVENOUS | Status: AC | PRN
  Administered 2020-07-20: 31.2 via INTRAVENOUS
  Filled 2020-07-20: qty 32

## 2020-08-09 ENCOUNTER — Other Ambulatory Visit: Payer: Self-pay

## 2020-08-09 ENCOUNTER — Ambulatory Visit: Payer: 59 | Admitting: Family Medicine

## 2020-08-09 ENCOUNTER — Encounter: Payer: Self-pay | Admitting: Family Medicine

## 2020-08-09 VITALS — BP 92/68 | HR 107 | Temp 97.7°F | Ht 59.75 in | Wt 114.0 lb

## 2020-08-09 DIAGNOSIS — M25511 Pain in right shoulder: Secondary | ICD-10-CM | POA: Insufficient documentation

## 2020-08-09 DIAGNOSIS — E1149 Type 2 diabetes mellitus with other diabetic neurological complication: Secondary | ICD-10-CM

## 2020-08-09 MED ORDER — TRAMADOL HCL 50 MG PO TABS: 50.0000 mg | ORAL_TABLET | Freq: Three times a day (TID) | ORAL | 0 refills | Status: DC | PRN

## 2020-08-09 MED ORDER — PROMETHAZINE HCL 25 MG PO TABS: ORAL_TABLET | ORAL | 1 refills | Status: DC

## 2020-08-09 MED ORDER — PREDNISONE 10 MG PO TABS: ORAL_TABLET | ORAL | 0 refills | Status: DC

## 2020-08-09 MED ORDER — CYCLOBENZAPRINE HCL 10 MG PO TABS
10.0000 mg | ORAL_TABLET | Freq: Every evening | ORAL | 0 refills | Status: DC | PRN
Start: 2020-08-09 — End: 2020-08-19

## 2020-08-09 NOTE — Assessment & Plan Note (Signed)
Most likely cervical radiculopathy given exam.  Treat with pred taper, muscle relaxant, cervical collar.  NSAIDs contraindicated given history of duodenal Ulcer.

## 2020-08-09 NOTE — Assessment & Plan Note (Signed)
Very high risk for DKA... empirically increase long acting insulin to 28 units daily.Marland Kitchen given starting low dose pred taper.

## 2020-08-09 NOTE — Patient Instructions (Signed)
Start prednisone taper. Go ahead and increase long acting insulin to 28 Units daily while on prednisone.  Can use muscle relaxant at night.  Use tramadol for breakthrough pain.  Heat on neck.  Wear cervical collar to limit motion in neck.

## 2020-08-09 NOTE — Progress Notes (Signed)
Chief Complaint  Patient presents with  . Shoulder Pain    right shoulder. Pt states she may have pulled a muscle while sleeping.    History of Present Illness: HPI  59 year old female with poorly controlled DM presents with new onset pain in right shoulder in last 24 hours. Started at 1 AM this morning. Not sure if she slept on it wrong... sharp pain woke her up at night.  Pain in right neck radiates to upper lateral shoulder.  Tylenol  3 caps did not help with pain. Muscle relaxant of brothers, [facility] did not help.   Feels better placing arm behind head.  Some increase with pain if leaning neck away from pain.. neck feel tight. Worse with moving arm out to side.   No known falls.  She has a dog that jumps on bed a lot.. few days ago she had to push large dog off the bed.  No numbness, no tingling in arms or hands.   No history of shoulder issue.    Hx of duodenal ulcer.. NSAIDS contraindicated.  FBS improved for her lately in 200s.  This visit occurred during the SARS-CoV-2 public health emergency.  Safety protocols were in place, including screening questions prior to the visit, additional usage of staff PPE, and extensive cleaning of exam room while observing appropriate contact time as indicated for disinfecting solutions.   COVID 19 screen:  No recent travel or known exposure to COVID19 The patient denies respiratory symptoms of COVID 19 at this time. The importance of social distancing was discussed today.     ROS    Past Medical History:  Diagnosis Date  . Allergy    seasonal  . Anxiety   . B12 deficiency   . Chest pain 04/05/2017  . CHF (congestive heart failure) ([place])   . Diabetes mellitus type 1, uncontrolled ([place])    "dr. just changed me to type 1, uncontrolled" (11/26/2018)  . DKA (diabetic ketoacidoses) ([place]) 05/25/2018  . GERD (gastroesophageal reflux disease)   . Hyperlipidemia   . Hypertension    [name]  . Hypothyroidism   . IBS (irritable  bowel syndrome)   . Intermittent vertigo   . 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 Leg cramping    "@ night" (09/18/2013)  . Migraine    "once q couple months" (11/26/2018)  . Osteoporosis   . Peripheral neuropathy   . Umbilical hernia    unrepaired (09/18/2013)    reports that she quit smoking about 29 years ago. Her smoking use included cigarettes. She has a 1.20 pack-year smoking history. She has never used smokeless tobacco. She reports current drug use. Drug: Marijuana. She reports that she does not drink alcohol.   Current Outpatient Medications:  .  acetaminophen (TYLENOL) 325 MG tablet, Take 650 mg by mouth every 6 (six) hours as needed for mild pain, fever or headache., Disp: , Rfl:  .  Continuous Blood Gluc Sensor (FREESTYLE LIBRE 14 DAY SENSOR) MISC, by Does not apply route., Disp: , Rfl:  .  fexofenadine (ALLEGRA) 180 MG tablet, Take 1 tablet (180 mg total) by mouth daily., Disp: 90 tablet, Rfl: 3 .  gabapentin (NEURONTIN) 300 MG capsule, Take 300 mg by mouth at bedtime as needed., Disp: , Rfl:  .  insulin lispro (HUMALOG KWIKPEN) 100 UNIT/ML KiwkPen, Inject into skin 3 times a day at meal time according to the following sliding scale: CBG 70-120 - 0 units / CBG 121-150 -  1 unit / CBG 151-200 - 2 units / CBG 201-250 - 3 units / CBG 251-300 - 5 units / CBG 301-350 - 7 units / CBG 351-400+ - 9 units  YOU MUST FOLLOW A STRICT DIABETIC DIET (Patient taking differently: Inject into the skin See admin instructions. 8 units plus sliding scale Inject into skin 3 times a day at meal time according to the following sliding scale: CBG 70-120 - 0 units / CBG 121-150 - 1 unit / CBG 151-200 - 2 units / CBG 201-250 - 3 units / CBG 251-300 - 5 units / CBG 301-350 - 7 units / CBG 351-400+ - 9 units  YOU MUST FOLLOW A STRICT DIABETIC DIET), Disp: 4 pen, Rfl: 11 .  Insulin Pen Needle (PEN NEEDLES) 31G X 8 MM MISC, Use to inject insulin 4 times a day.  Dx: E11.10, Disp: 130  each, Rfl: 11 .  Lancets (ONETOUCH ULTRASOFT) lancets, Use to check blood sugar daily as needed.  Dx: E11.10, Disp: 100 each, Rfl: 3 .  levothyroxine (SYNTHROID) 175 MCG tablet, Take 175 mcg by mouth daily., Disp: , Rfl:  .  metoCLOPramide (REGLAN) 5 MG tablet, Take 5mg  before breakfast lunch and dinner and 10mg  at bedtime, Disp: 150 tablet, Rfl: 11 .  metoprolol succinate (TOPROL-XL) 25 MG 24 hr tablet, Take 1 tablet (25 mg total) by mouth daily., Disp: 90 tablet, Rfl: 2 .  pantoprazole (PROTONIX) 40 MG tablet, Take 1 tablet (40 mg total) by mouth 2 (two) times daily before a meal., Disp: 30 tablet, Rfl: 11 .  promethazine (PHENERGAN) 25 MG tablet, TAKE 1 TABLET BY MOUTH EVERY 8 HOURS AS NEEDED FOR NAUSEA AND VOMITING, Disp: 30 tablet, Rfl: 1 .  rosuvastatin (CRESTOR) 40 MG tablet, TAKE 1 TABLET BY MOUTH EVERY DAY, Disp: 90 tablet, Rfl: 3 .  sertraline (ZOLOFT) 100 MG tablet, TAKE 1 TABLET BY MOUTH EVERY DAY, Disp: 90 tablet, Rfl: 1 .  TRESIBA FLEXTOUCH 100 UNIT/ML SOPN FlexTouch Pen, Inject 0.24 mLs (24 Units total) into the skin at bedtime. On 10/20/18 take at lunch time, on 10/21/18 take at dinner time. On 10/22/18 resume at bedtime regimen., Disp: , Rfl: 5 .  HUMALOG KWIKPEN 100 UNIT/ML KwikPen, SMARTSIG:8 Unit(s) SUB-Q, Disp: , Rfl:  .  ONETOUCH VERIO test strip, USE TO CHECK BLOOD SUGAR DAILY AS NEEDED. DX: E11.10 (Patient not taking: Reported on 08/09/2020), Disp: 100 strip, Rfl: 3 .  TOUJEO SOLOSTAR 300 UNIT/ML Solostar Pen, SMARTSIG:24 Unit(s) SUB-Q Every Night, Disp: , Rfl:    Observations/Objective: Blood pressure 92/68, pulse (!) 107, temperature 97.7 F (36.5 C), temperature source Temporal, height 4' 11.75" (1.518 m), weight 114 lb (51.7 kg), last menstrual period 08/11/2012, SpO2 97 %.  Physical Exam Constitutional:      General: She is not in acute distress.    Appearance: Normal appearance. She is well-developed. She is not ill-appearing or toxic-appearing.  HENT:     Head:  Normocephalic.     Right Ear: Hearing, tympanic membrane, ear canal and external ear normal. Tympanic membrane is not erythematous, retracted or bulging.     Left Ear: Hearing, tympanic membrane, ear canal and external ear normal. Tympanic membrane is not erythematous, retracted or bulging.     Nose: No mucosal edema or rhinorrhea.     Right Sinus: No maxillary sinus tenderness or frontal sinus tenderness.     Left Sinus: No maxillary sinus tenderness or frontal sinus tenderness.     Mouth/Throat:     Pharynx: Uvula  midline.  Eyes:     General: Lids are normal. Lids are everted, no foreign bodies appreciated.     Conjunctiva/sclera: Conjunctivae normal.     Pupils: Pupils are equal, round, and reactive to light.  Neck:     Thyroid: No thyroid mass or thyromegaly.     Vascular: No carotid bruit.     Trachea: Trachea normal.  Cardiovascular:     Rate and Rhythm: Normal rate and regular rhythm.     Pulses: Normal pulses.     Heart sounds: Normal heart sounds, S1 normal and S2 normal. No murmur heard.  No friction rub. No gallop.   Pulmonary:     Effort: Pulmonary effort is normal. No tachypnea or respiratory distress.     Breath sounds: Normal breath sounds. No decreased breath sounds, wheezing, rhonchi or rales.  Abdominal:     General: Bowel sounds are normal.     Palpations: Abdomen is soft.     Tenderness: There is no abdominal tenderness.  Musculoskeletal:     Right shoulder: Tenderness present. No bony tenderness or crepitus. Decreased range of motion. Normal strength.     Left shoulder: Normal.     Cervical back: Neck supple. No rigidity. Pain with movement and muscular tenderness present. No spinous process tenderness. Decreased range of motion.     Comments:  Diffuse ttp  Positive spurling on right  neg drop arm, neg neer's test but does have pain with external rotation.  Skin:    General: Skin is warm and dry.     Findings: No rash.  Neurological:     Mental Status: She  is alert.  Psychiatric:        Mood and Affect: Mood is not anxious or depressed.        Speech: Speech normal.        Behavior: Behavior normal. Behavior is cooperative.        Thought Content: Thought content normal.        Judgment: Judgment normal.      Assessment and Plan    Acute pain of right shoulder  Most likely cervical radiculopathy given exam.  Treat with pred taper, muscle relaxant, cervical collar.  NSAIDs contraindicated given history of duodenal Ulcer.   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 empirically increase long acting insulin to 28 units daily.Marland Kitchen given starting low dose pred taper.    Eliezer Lofts, MD

## 2020-08-11 ENCOUNTER — Telehealth: Payer: Self-pay

## 2020-08-11 MED ORDER — HYDROCODONE-ACETAMINOPHEN 5-325 MG PO TABS: 1.0000 | ORAL_TABLET | Freq: Three times a day (TID) | ORAL | 0 refills | Status: DC | PRN

## 2020-08-11 NOTE — Addendum Note (Signed)
Addended by: Eliezer Lofts E on: 08/11/2020 05:09 PM   Modules accepted: Orders

## 2020-08-11 NOTE — Telephone Encounter (Signed)
Pt seen on 08/31/21and this morning the rt sided neck pain radiating to rt shoulder is worse. Pt does not have numbness or tingling in rt arm or hand. Since 08/09/20 pt has been taking prednisone, muscle relaxant at night,wearing the cervical collar, heat to neck which seems to help with pain, but tramadol is not helping the pain. Pt request stronger pain med to [facility].(pt is allergic to codeine). Pt also bought new pillow for better neck support. Sending note to Dr Diona Browner.

## 2020-08-11 NOTE — Telephone Encounter (Signed)
Spoke with pt asking if she has any issues with Vicodin.  Says she does not and that would be great.

## 2020-08-11 NOTE — Telephone Encounter (Signed)
Does  she have issues with vicodin?

## 2020-08-15 ENCOUNTER — Other Ambulatory Visit: Payer: Self-pay

## 2020-08-15 ENCOUNTER — Emergency Department (HOSPITAL_COMMUNITY): Payer: 59

## 2020-08-15 ENCOUNTER — Emergency Department (HOSPITAL_COMMUNITY)
Admission: EM | Admit: 2020-08-15 | Discharge: 2020-08-15 | Disposition: A | Discharge status: Home / Self Care | Payer: 59 | Source: Home / Self Care

## 2020-08-15 DIAGNOSIS — E1110 Type 2 diabetes mellitus with ketoacidosis without coma: Secondary | ICD-10-CM | POA: Diagnosis not present

## 2020-08-15 DIAGNOSIS — Z5321 Procedure and treatment not carried out due to patient leaving prior to being seen by health care provider: Secondary | ICD-10-CM | POA: Insufficient documentation

## 2020-08-15 DIAGNOSIS — R05 Cough: Secondary | ICD-10-CM | POA: Insufficient documentation

## 2020-08-15 DIAGNOSIS — R002 Palpitations: Secondary | ICD-10-CM | POA: Insufficient documentation

## 2020-08-15 DIAGNOSIS — R0789 Other chest pain: Secondary | ICD-10-CM | POA: Insufficient documentation

## 2020-08-15 DIAGNOSIS — E1010 Type 1 diabetes mellitus with ketoacidosis without coma: Secondary | ICD-10-CM | POA: Diagnosis not present

## 2020-08-15 LAB — BASIC METABOLIC PANEL
Anion gap: 17 — ABNORMAL HIGH (ref 5–15)
BUN: 25 mg/dL — ABNORMAL HIGH (ref 6–20)
CO2: 22 mmol/L (ref 22–32)
Calcium: 9.6 mg/dL (ref 8.9–10.3)
Chloride: 94 mmol/L — ABNORMAL LOW (ref 98–111)
Creatinine, Ser: 0.65 mg/dL (ref 0.44–1.00)
GFR calc Af Amer: >60 mL/min (ref >60)
GFR calc non Af Amer: >60 mL/min (ref >60)
Glucose, Bld: 312 mg/dL — ABNORMAL HIGH (ref 70–99)
Potassium: 4 mmol/L (ref 3.5–5.1)
Sodium: 133 mmol/L — ABNORMAL LOW (ref 135–145)

## 2020-08-15 LAB — CBC
HCT: 52.3 % — ABNORMAL HIGH (ref 36.0–46.0)
Hemoglobin: 17.9 g/dL — ABNORMAL HIGH (ref 12.0–15.0)
MCH: 30 pg (ref 26.0–34.0)
MCHC: 34.2 g/dL (ref 30.0–36.0)
MCV: 87.6 fL (ref 80.0–100.0)
Platelets: 399 10*3/uL (ref 150–400)
RBC: 5.97 MIL/uL — ABNORMAL HIGH (ref 3.87–5.11)
RDW: 13.2 % (ref 11.5–15.5)
WBC: 12 10*3/uL — ABNORMAL HIGH (ref 4.0–10.5)
nRBC: 0 % (ref 0.0–0.2)

## 2020-08-15 LAB — TROPONIN I (HIGH SENSITIVITY): Troponin I (High Sensitivity): 4 ng/L (ref <18)

## 2020-08-15 NOTE — ED Notes (Signed)
Pt did'nt asnwer when called for vitals

## 2020-08-15 NOTE — ED Notes (Signed)
Pt not answering for vitals, moving off the floor.

## 2020-08-15 NOTE — ED Triage Notes (Signed)
Pt here via EMS from home for eval of chest pressure radiating to R arm and neck that woke her out of sleep at 0230 this morning. Pain increases with palpation and coughing. Seen for same by her PCP but thought to be muscular.

## 2020-08-16 ENCOUNTER — Encounter (HOSPITAL_COMMUNITY): Payer: Self-pay

## 2020-08-16 ENCOUNTER — Inpatient Hospital Stay (HOSPITAL_COMMUNITY)
Admission: EM | Admit: 2020-08-16 | Discharge: 2020-08-19 | DRG: 638 | Disposition: A | Discharge status: Home / Self Care | Payer: 59 | Attending: Family Medicine | Admitting: Family Medicine

## 2020-08-16 ENCOUNTER — Emergency Department (HOSPITAL_COMMUNITY): Payer: 59

## 2020-08-16 ENCOUNTER — Other Ambulatory Visit: Payer: Self-pay

## 2020-08-16 DIAGNOSIS — F320 Major depressive disorder, single episode, mild: Secondary | ICD-10-CM | POA: Diagnosis present

## 2020-08-16 DIAGNOSIS — E039 Hypothyroidism, unspecified: Secondary | ICD-10-CM | POA: Diagnosis present

## 2020-08-16 DIAGNOSIS — Z8711 Personal history of peptic ulcer disease: Secondary | ICD-10-CM

## 2020-08-16 DIAGNOSIS — T380X5A Adverse effect of glucocorticoids and synthetic analogues, initial encounter: Secondary | ICD-10-CM | POA: Diagnosis present

## 2020-08-16 DIAGNOSIS — N182 Chronic kidney disease, stage 2 (mild): Secondary | ICD-10-CM | POA: Diagnosis present

## 2020-08-16 DIAGNOSIS — Y929 Unspecified place or not applicable: Secondary | ICD-10-CM | POA: Diagnosis not present

## 2020-08-16 DIAGNOSIS — F331 Major depressive disorder, recurrent, moderate: Secondary | ICD-10-CM | POA: Diagnosis present

## 2020-08-16 DIAGNOSIS — I5022 Chronic systolic (congestive) heart failure: Secondary | ICD-10-CM | POA: Diagnosis present

## 2020-08-16 DIAGNOSIS — Z20822 Contact with and (suspected) exposure to covid-19: Secondary | ICD-10-CM | POA: Diagnosis present

## 2020-08-16 DIAGNOSIS — Z833 Family history of diabetes mellitus: Secondary | ICD-10-CM

## 2020-08-16 DIAGNOSIS — E876 Hypokalemia: Secondary | ICD-10-CM | POA: Diagnosis present

## 2020-08-16 DIAGNOSIS — Z794 Long term (current) use of insulin: Secondary | ICD-10-CM

## 2020-08-16 DIAGNOSIS — I10 Essential (primary) hypertension: Secondary | ICD-10-CM | POA: Diagnosis not present

## 2020-08-16 DIAGNOSIS — I130 Hypertensive heart and chronic kidney disease with heart failure and stage 1 through stage 4 chronic kidney disease, or unspecified chronic kidney disease: Secondary | ICD-10-CM | POA: Diagnosis present

## 2020-08-16 DIAGNOSIS — K2100 Gastro-esophageal reflux disease with esophagitis, without bleeding: Secondary | ICD-10-CM | POA: Diagnosis present

## 2020-08-16 DIAGNOSIS — Z8249 Family history of ischemic heart disease and other diseases of the circulatory system: Secondary | ICD-10-CM | POA: Diagnosis not present

## 2020-08-16 DIAGNOSIS — R1012 Left upper quadrant pain: Secondary | ICD-10-CM | POA: Insufficient documentation

## 2020-08-16 DIAGNOSIS — K920 Hematemesis: Secondary | ICD-10-CM | POA: Insufficient documentation

## 2020-08-16 DIAGNOSIS — E1043 Type 1 diabetes mellitus with diabetic autonomic (poly)neuropathy: Secondary | ICD-10-CM | POA: Diagnosis present

## 2020-08-16 DIAGNOSIS — K3184 Gastroparesis: Secondary | ICD-10-CM | POA: Diagnosis present

## 2020-08-16 DIAGNOSIS — E1022 Type 1 diabetes mellitus with diabetic chronic kidney disease: Secondary | ICD-10-CM | POA: Diagnosis present

## 2020-08-16 DIAGNOSIS — K922 Gastrointestinal hemorrhage, unspecified: Secondary | ICD-10-CM | POA: Diagnosis not present

## 2020-08-16 DIAGNOSIS — Z87891 Personal history of nicotine dependence: Secondary | ICD-10-CM

## 2020-08-16 DIAGNOSIS — E1010 Type 1 diabetes mellitus with ketoacidosis without coma: Secondary | ICD-10-CM | POA: Diagnosis present

## 2020-08-16 DIAGNOSIS — Z820 Family history of epilepsy and other diseases of the nervous system: Secondary | ICD-10-CM

## 2020-08-16 DIAGNOSIS — E1310 Other specified diabetes mellitus with ketoacidosis without coma: Secondary | ICD-10-CM | POA: Diagnosis not present

## 2020-08-16 DIAGNOSIS — E1143 Type 2 diabetes mellitus with diabetic autonomic (poly)neuropathy: Secondary | ICD-10-CM | POA: Diagnosis not present

## 2020-08-16 DIAGNOSIS — F411 Generalized anxiety disorder: Secondary | ICD-10-CM | POA: Diagnosis present

## 2020-08-16 DIAGNOSIS — Z7952 Long term (current) use of systemic steroids: Secondary | ICD-10-CM | POA: Diagnosis not present

## 2020-08-16 DIAGNOSIS — Z79899 Other long term (current) drug therapy: Secondary | ICD-10-CM | POA: Diagnosis not present

## 2020-08-16 DIAGNOSIS — K589 Irritable bowel syndrome without diarrhea: Secondary | ICD-10-CM | POA: Diagnosis present

## 2020-08-16 DIAGNOSIS — E538 Deficiency of other specified B group vitamins: Secondary | ICD-10-CM | POA: Diagnosis present

## 2020-08-16 DIAGNOSIS — E1110 Type 2 diabetes mellitus with ketoacidosis without coma: Secondary | ICD-10-CM | POA: Diagnosis present

## 2020-08-16 DIAGNOSIS — K209 Esophagitis, unspecified without bleeding: Secondary | ICD-10-CM

## 2020-08-16 DIAGNOSIS — E785 Hyperlipidemia, unspecified: Secondary | ICD-10-CM | POA: Diagnosis present

## 2020-08-16 DIAGNOSIS — K259 Gastric ulcer, unspecified as acute or chronic, without hemorrhage or perforation: Secondary | ICD-10-CM

## 2020-08-16 DIAGNOSIS — R112 Nausea with vomiting, unspecified: Secondary | ICD-10-CM

## 2020-08-16 DIAGNOSIS — G8929 Other chronic pain: Secondary | ICD-10-CM | POA: Diagnosis present

## 2020-08-16 DIAGNOSIS — M810 Age-related osteoporosis without current pathological fracture: Secondary | ICD-10-CM | POA: Diagnosis present

## 2020-08-16 DIAGNOSIS — Z79890 Hormone replacement therapy (postmenopausal): Secondary | ICD-10-CM | POA: Diagnosis not present

## 2020-08-16 DIAGNOSIS — I480 Paroxysmal atrial fibrillation: Secondary | ICD-10-CM | POA: Diagnosis present

## 2020-08-16 LAB — BASIC METABOLIC PANEL
Anion gap: 12 (ref 5–15)
Anion gap: 8 (ref 5–15)
Anion gap: 8 (ref 5–15)
Anion gap: 13 (ref 5–15)
BUN: 17 mg/dL (ref 6–20)
BUN: 14 mg/dL (ref 6–20)
BUN: 10 mg/dL (ref 6–20)
BUN: 8 mg/dL (ref 6–20)
CO2: 24 mmol/L (ref 22–32)
CO2: 23 mmol/L (ref 22–32)
CO2: 21 mmol/L — ABNORMAL LOW (ref 22–32)
CO2: 19 mmol/L — ABNORMAL LOW (ref 22–32)
Calcium: 8.4 mg/dL — ABNORMAL LOW (ref 8.9–10.3)
Calcium: 8.3 mg/dL — ABNORMAL LOW (ref 8.9–10.3)
Calcium: 8.3 mg/dL — ABNORMAL LOW (ref 8.9–10.3)
Calcium: 8.5 mg/dL — ABNORMAL LOW (ref 8.9–10.3)
Chloride: 101 mmol/L (ref 98–111)
Chloride: 104 mmol/L (ref 98–111)
Chloride: 108 mmol/L (ref 98–111)
Chloride: 102 mmol/L (ref 98–111)
Creatinine, Ser: 0.46 mg/dL (ref 0.44–1.00)
Creatinine, Ser: 0.35 mg/dL — ABNORMAL LOW (ref 0.44–1.00)
Creatinine, Ser: 0.35 mg/dL — ABNORMAL LOW (ref 0.44–1.00)
Creatinine, Ser: 0.42 mg/dL — ABNORMAL LOW (ref 0.44–1.00)
GFR calc Af Amer: >60 mL/min (ref >60)
GFR calc Af Amer: >60 mL/min (ref >60)
GFR calc Af Amer: >60 mL/min (ref >60)
GFR calc Af Amer: >60 mL/min (ref >60)
GFR calc non Af Amer: >60 mL/min (ref >60)
GFR calc non Af Amer: >60 mL/min (ref >60)
GFR calc non Af Amer: >60 mL/min (ref >60)
GFR calc non Af Amer: >60 mL/min (ref >60)
Glucose, Bld: 207 mg/dL — ABNORMAL HIGH (ref 70–99)
Glucose, Bld: 211 mg/dL — ABNORMAL HIGH (ref 70–99)
Glucose, Bld: 176 mg/dL — ABNORMAL HIGH (ref 70–99)
Glucose, Bld: 216 mg/dL — ABNORMAL HIGH (ref 70–99)
Potassium: 3.7 mmol/L (ref 3.5–5.1)
Potassium: 3.5 mmol/L (ref 3.5–5.1)
Potassium: 3.4 mmol/L — ABNORMAL LOW (ref 3.5–5.1)
Potassium: 3.2 mmol/L — ABNORMAL LOW (ref 3.5–5.1)
Sodium: 137 mmol/L (ref 135–145)
Sodium: 135 mmol/L (ref 135–145)
Sodium: 137 mmol/L (ref 135–145)
Sodium: 134 mmol/L — ABNORMAL LOW (ref 135–145)

## 2020-08-16 LAB — CBG MONITORING, ED
Glucose-Capillary: >600 mg/dL (ref 70–99)
Glucose-Capillary: 393 mg/dL — ABNORMAL HIGH (ref 70–99)
Glucose-Capillary: 279 mg/dL — ABNORMAL HIGH (ref 70–99)
Glucose-Capillary: 171 mg/dL — ABNORMAL HIGH (ref 70–99)
Glucose-Capillary: 221 mg/dL — ABNORMAL HIGH (ref 70–99)
Glucose-Capillary: 222 mg/dL — ABNORMAL HIGH (ref 70–99)
Glucose-Capillary: 176 mg/dL — ABNORMAL HIGH (ref 70–99)
Glucose-Capillary: 224 mg/dL — ABNORMAL HIGH (ref 70–99)
Glucose-Capillary: 188 mg/dL — ABNORMAL HIGH (ref 70–99)
Glucose-Capillary: 177 mg/dL — ABNORMAL HIGH (ref 70–99)
Glucose-Capillary: 181 mg/dL — ABNORMAL HIGH (ref 70–99)
Glucose-Capillary: 161 mg/dL — ABNORMAL HIGH (ref 70–99)
Glucose-Capillary: 213 mg/dL — ABNORMAL HIGH (ref 70–99)

## 2020-08-16 LAB — COMPREHENSIVE METABOLIC PANEL
ALT: 12 U/L (ref 0–44)
AST: 16 U/L (ref 15–41)
Albumin: 4.8 g/dL (ref 3.5–5.0)
Alkaline Phosphatase: 55 U/L (ref 38–126)
Anion gap: 32 — ABNORMAL HIGH (ref 5–15)
BUN: 30 mg/dL — ABNORMAL HIGH (ref 6–20)
CO2: 13 mmol/L — ABNORMAL LOW (ref 22–32)
Calcium: 9.8 mg/dL (ref 8.9–10.3)
Chloride: 90 mmol/L — ABNORMAL LOW (ref 98–111)
Creatinine, Ser: 0.86 mg/dL (ref 0.44–1.00)
GFR calc Af Amer: >60 mL/min (ref >60)
GFR calc non Af Amer: >60 mL/min (ref >60)
Glucose, Bld: 502 mg/dL (ref 70–99)
Potassium: 3 mmol/L — ABNORMAL LOW (ref 3.5–5.1)
Sodium: 135 mmol/L (ref 135–145)
Total Bilirubin: 1.4 mg/dL — ABNORMAL HIGH (ref 0.3–1.2)
Total Protein: 8.2 g/dL — ABNORMAL HIGH (ref 6.5–8.1)

## 2020-08-16 LAB — CBC
HCT: 55.8 % — ABNORMAL HIGH (ref 36.0–46.0)
Hemoglobin: 18.8 g/dL — ABNORMAL HIGH (ref 12.0–15.0)
MCH: 29.7 pg (ref 26.0–34.0)
MCHC: 33.7 g/dL (ref 30.0–36.0)
MCV: 88.3 fL (ref 80.0–100.0)
Platelets: 439 10*3/uL — ABNORMAL HIGH (ref 150–400)
RBC: 6.32 MIL/uL — ABNORMAL HIGH (ref 3.87–5.11)
RDW: 13.5 % (ref 11.5–15.5)
WBC: 26.5 10*3/uL — ABNORMAL HIGH (ref 4.0–10.5)
nRBC: 0 % (ref 0.0–0.2)

## 2020-08-16 LAB — I-STAT BETA HCG BLOOD, ED (MC, WL, AP ONLY): I-stat hCG, quantitative: 5.2 m[IU]/mL — ABNORMAL HIGH (ref <5)

## 2020-08-16 LAB — URINALYSIS, ROUTINE W REFLEX MICROSCOPIC
Bilirubin Urine: NEGATIVE
Glucose, UA: >=500 mg/dL — AB
Ketones, ur: 80 mg/dL — AB
Nitrite: NEGATIVE
Protein, ur: NEGATIVE mg/dL
Specific Gravity, Urine: 1.032 — ABNORMAL HIGH (ref 1.005–1.030)
pH: 5 (ref 5.0–8.0)

## 2020-08-16 LAB — SARS CORONAVIRUS 2 BY RT PCR (HOSPITAL ORDER, PERFORMED IN ~~LOC~~ HOSPITAL LAB): SARS Coronavirus 2: NEGATIVE

## 2020-08-16 LAB — BETA-HYDROXYBUTYRIC ACID
Beta-Hydroxybutyric Acid: 7.6 mmol/L — ABNORMAL HIGH (ref 0.05–0.27)
Beta-Hydroxybutyric Acid: 1.28 mmol/L — ABNORMAL HIGH (ref 0.05–0.27)
Beta-Hydroxybutyric Acid: 0.2 mmol/L (ref 0.05–0.27)
Beta-Hydroxybutyric Acid: 0.16 mmol/L (ref 0.05–0.27)
Beta-Hydroxybutyric Acid: 0.18 mmol/L (ref 0.05–0.27)

## 2020-08-16 LAB — BLOOD GAS, VENOUS
Acid-base deficit: 9.7 mmol/L — ABNORMAL HIGH (ref 0.0–2.0)
Bicarbonate: 14.4 mmol/L — ABNORMAL LOW (ref 20.0–28.0)
FIO2: 21
O2 Saturation: 86.7 %
Patient temperature: 98.6
pCO2, Ven: 28.3 mmHg — ABNORMAL LOW (ref 44.0–60.0)
pH, Ven: 7.327 (ref 7.250–7.430)
pO2, Ven: 59.4 mmHg — ABNORMAL HIGH (ref 32.0–45.0)

## 2020-08-16 LAB — MAGNESIUM: Magnesium: 1.4 mg/dL — ABNORMAL LOW (ref 1.7–2.4)

## 2020-08-16 LAB — TYPE AND SCREEN
ABO/RH(D): B NEG
Antibody Screen: NEGATIVE

## 2020-08-16 LAB — PROTIME-INR
INR: 1.2 (ref 0.8–1.2)
Prothrombin Time: 14.8 seconds (ref 11.4–15.2)

## 2020-08-16 LAB — LIPASE, BLOOD: Lipase: 21 U/L (ref 11–51)

## 2020-08-16 LAB — HIV ANTIBODY (ROUTINE TESTING W REFLEX): HIV Screen 4th Generation wRfx: NONREACTIVE

## 2020-08-16 MED ORDER — ONDANSETRON HCL 4 MG/2ML IJ SOLN
4.0000 mg | Freq: Once | INTRAMUSCULAR | Status: AC
  Administered 2020-08-16: 4 mg via INTRAVENOUS
  Filled 2020-08-16: qty 2

## 2020-08-16 MED ORDER — LACTATED RINGERS IV SOLN: INTRAVENOUS | Status: DC

## 2020-08-16 MED ORDER — LACTATED RINGERS IV BOLUS
20.0000 mL/kg | Freq: Once | INTRAVENOUS | Status: AC
  Administered 2020-08-16: 1034 mL via INTRAVENOUS

## 2020-08-16 MED ORDER — INSULIN REGULAR(HUMAN) IN NACL 100-0.9 UT/100ML-% IV SOLN: INTRAVENOUS | Status: DC

## 2020-08-16 MED ORDER — DEXTROSE 50 % IV SOLN: 0.0000 mL | INTRAVENOUS | Status: DC | PRN

## 2020-08-16 MED ORDER — IOHEXOL 300 MG/ML  SOLN
100.0000 mL | Freq: Once | INTRAMUSCULAR | Status: AC | PRN
  Administered 2020-08-16: 100 mL via INTRAVENOUS

## 2020-08-16 MED ORDER — INSULIN REGULAR(HUMAN) IN NACL 100-0.9 UT/100ML-% IV SOLN
INTRAVENOUS | Status: DC
  Administered 2020-08-16: 10.5 [IU]/h via INTRAVENOUS
  Filled 2020-08-16: qty 100

## 2020-08-16 MED ORDER — DEXTROSE IN LACTATED RINGERS 5 % IV SOLN: INTRAVENOUS | Status: DC

## 2020-08-16 MED ORDER — INSULIN ASPART 100 UNIT/ML ~~LOC~~ SOLN
0.0000 [IU] | Freq: Three times a day (TID) | SUBCUTANEOUS | Status: DC
  Filled 2020-08-16: qty 0.09

## 2020-08-16 MED ORDER — METOPROLOL TARTRATE 5 MG/5ML IV SOLN
5.0000 mg | Freq: Two times a day (BID) | INTRAVENOUS | Status: DC
  Administered 2020-08-16 – 2020-08-17 (×4): 5 mg via INTRAVENOUS
  Filled 2020-08-16 (×4): qty 5

## 2020-08-16 MED ORDER — SODIUM CHLORIDE 0.9 % IV SOLN
80.0000 mg | Freq: Once | INTRAVENOUS | Status: AC
  Administered 2020-08-16: 12:00:00 80 mg via INTRAVENOUS
  Filled 2020-08-16: qty 80

## 2020-08-16 MED ORDER — SUCRALFATE 1 GM/10ML PO SUSP
1.0000 g | Freq: Three times a day (TID) | ORAL | Status: DC
  Administered 2020-08-16 – 2020-08-19 (×9): 1 g via ORAL
  Filled 2020-08-16 (×10): qty 10

## 2020-08-16 MED ORDER — POTASSIUM CHLORIDE 10 MEQ/100ML IV SOLN
10.0000 meq | INTRAVENOUS | Status: AC
  Administered 2020-08-16 (×3): 10 meq via INTRAVENOUS
  Filled 2020-08-16 (×3): qty 100

## 2020-08-16 MED ORDER — INSULIN GLARGINE 100 UNIT/ML ~~LOC~~ SOLN
16.0000 [IU] | SUBCUTANEOUS | Status: DC
  Administered 2020-08-16 – 2020-08-18 (×3): 16 [IU] via SUBCUTANEOUS
  Filled 2020-08-16 (×3): qty 0.16

## 2020-08-16 MED ORDER — INSULIN ASPART 100 UNIT/ML ~~LOC~~ SOLN
0.0000 [IU] | Freq: Every day | SUBCUTANEOUS | Status: DC
  Administered 2020-08-16: 2 [IU] via SUBCUTANEOUS
  Filled 2020-08-16: qty 0.05

## 2020-08-16 MED ORDER — POTASSIUM CHLORIDE 10 MEQ/100ML IV SOLN
10.0000 meq | INTRAVENOUS | Status: AC
  Administered 2020-08-16 (×4): 10 meq via INTRAVENOUS
  Filled 2020-08-16 (×4): qty 100

## 2020-08-16 MED ORDER — PANTOPRAZOLE SODIUM 40 MG IV SOLR: 40.0000 mg | Freq: Two times a day (BID) | INTRAVENOUS | Status: DC

## 2020-08-16 MED ORDER — FENTANYL CITRATE (PF) 100 MCG/2ML IJ SOLN
25.0000 ug | Freq: Once | INTRAMUSCULAR | Status: AC
  Administered 2020-08-16: 25 ug via INTRAVENOUS
  Filled 2020-08-16: qty 2

## 2020-08-16 MED ORDER — SODIUM CHLORIDE 0.9 % IV SOLN
8.0000 mg/h | INTRAVENOUS | Status: DC
  Administered 2020-08-16 – 2020-08-17 (×3): 8 mg/h via INTRAVENOUS
  Filled 2020-08-16 (×4): qty 80

## 2020-08-16 MED ORDER — ONDANSETRON HCL 4 MG/2ML IJ SOLN
4.0000 mg | Freq: Four times a day (QID) | INTRAMUSCULAR | Status: DC
  Administered 2020-08-16 – 2020-08-17 (×4): 4 mg via INTRAVENOUS
  Filled 2020-08-16 (×4): qty 2

## 2020-08-16 NOTE — Consult Note (Addendum)
Referring Provider:  [facility]         Primary Care Physician:  Jinny Sanders, MD Primary Gastroenterologist: Oretha Caprice, MD                 We were asked to see this patient for:     hematemesis             ASSESSMENT / PLAN:   # Acute on chronic nausea and acute hematemesis --Gastric distention without obstructing lesion on CT scan. Could have delayed gastric emptying in setting of DKA.  --Hematemesis occurred with first emesis making Mallory Weiss tear seem less likely. Hx of severe esophagitis which could be the source. Recurrent / persistent dudoenal ulcers seems less likely culprit.   --Amount of blood loss hard to determine, she describes as "a lot". Cannot rely on H/H in setting of volume depletion / IV fluids but her hgb is 18 --She will most likely require EGD after resolution of DKA.  --In the interim recommend BID IV PPI, Carafate. I asked her to keep [facility] elevated 30-45 degrees  # Recurrent DKA --Glucose is 502, K+ 3. C02 13. Anion gap 32. --PCCM managing.      HPI:                                                                                                                             Chief Complaint: blood in vomit  [name] [name] is a 59 y.o. female with a pmh significant for, not necessarily limited to: IBS,GERD, Shatzki's ring, H.pylori infection ( treated 2018), duodenal ulcers , diverticulosis.   HTN, hyperlipidemia, DM, hypothyroidism, CKD. We last saw her in the office in 2018 but saw her inpatient a couple of times in 2020 for abdominal pain, N/V in setting of DKA. During admission in June 2020 she had findings of gastroenteritis on imaging. Enteroscopy revealing of esophagitis and duodenal ulcers.    Patient brought by EMS to ED yesterday for evaluation of chest pain / LUQ pain and vomiting. Admitted with severe DKA, PCCM following. Patient says she has chronic nausea and chronic abdominal pain but yesterday around 5:30pm began vomiting. Emesis  immediately contained bright red blood and she had 3 or so episodes. She has since developed burning in throat. She takes BID Protonix and rarely skips doses. She takes a daily baby asa, rarely any other NSAIDS. She hasn't been having any significant abdominal pain. Bowel movements at baseline, no blood in stool. In ED her WBC was 12, it has increased to 26.5 today. Hgb / Hct elevated at 17.9 / 52.3. Today hgb is up to 18.8. Glucose is 502, K+ 3. C02 only 13. Anion gap 32. Lipase 21, LFTs normal. CT scan w/ contrast - circumferential thickening of distal esophagus c/w esophagitis, marked distention of stomach without obstructing lesion.    Previous Endoscopic Evaluations / Pertinent Studies:   Feb 2018 EGD for nausea and dysphagia -Benign-appearing esophageal stenosis.  Dilated. - A single mucosal papule (nodule) found in the stomach. Biopsied. - The examination was otherwise normal. Biopsies = minimally active H.pylori.   March 2009 colonoscopy for N/V/D and pan-colitis on CT scan Benign-appearing esophageal stenosis. Dilated. - A single mucosal papule (nodule) found in the stomach. Biopsied. - The examination was otherwise normal. -random colon biopsies were negative.   June 2020 Screening colonoscopy  -LA Grade D esophagitis. Biopsied. - Normal stomach. Biopsied. - Non-bleeding duodenal ulcers with no stigmata of bleeding. Biopsied. - The examined portion of the jejunum was normal.  June 2020 enteroscopy -LA Grade D esophagitis. Biopsied. - Normal stomach. Biopsied. - Non-bleeding duodenal ulcers with no stigmata of bleeding. Biopsied. - The examined portion of the jejunum was normal. Duodenum, Biopsy, 2nd portion - DUODENAL MUCOSA WITH NO SIGNIFICANT PATHOLOGIC FINDINGS. - NEGATIVE FOR INCREASED INTRAEPITHELIAL LYMPHOCYTES AND VILLOUS ARCHITECTURAL CHANGES. 2. Stomach, biopsy, body - GASTRIC ANTRAL MUCOSA WITH MILD REACTIVE GASTROPATHY. - WARTHIN-STARRY STAIN IS NEGATIVE FOR  HELICOBACTER PYLORI. 3. Esophagus, biopsy - SQUAMOCOLUMNAR ESOPHAGEAL MUCOSA, ULCERATED AND INFLAMED WITH NECROINFLAMMATORY DEBRIS. - NEGATIVE FOR INTESTINAL METAPLASIA (GOBLET CELL METAPLASIA). Microscopic Comment  Past Medical History:  Diagnosis Date   Allergy    seasonal   Anxiety    B12 deficiency    Chest pain 04/05/2017   CHF (congestive heart failure) ([place])    Diabetes mellitus type 1, uncontrolled ([place])    "dr. 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) 10/29/2013   Leg cramping    "@ night" (09/18/2013)   Migraine    "once q couple months" (11/26/2018)   Osteoporosis    Peripheral neuropathy    Umbilical hernia    unrepaired (09/18/2013)    Past Surgical History:  Procedure Laterality Date   BIOPSY  06/03/2019   Procedure: BIOPSY;  Surgeon: Jackquline Denmark, MD;  Location: [facility];  Service: Endoscopy;;   [place]   ENTEROSCOPY N/A 06/03/2019   Procedure: ENTEROSCOPY;  Surgeon: Jackquline Denmark, MD;  Location: [facility];  Service: Endoscopy;  Laterality: N/A;   LAPAROSCOPIC ASSISTED VAGINAL HYSTERECTOMY  09/23/2012   Procedure: LAPAROSCOPIC ASSISTED VAGINAL HYSTERECTOMY;  Surgeon: Linda Hedges, DO;  Location: [facility];  Service: Gynecology;  Laterality: N/A;  pull Dr Gregor Hams instrument   LEFT HEART CATH AND CORONARY ANGIOGRAPHY N/A 02/11/2017   Procedure: Left Heart Cath and Coronary Angiography;  Surgeon: Lorretta Harp, MD;  Location: [facility];  Service: Cardiovascular;  Laterality: N/A;   TUBAL LIGATION Bilateral 1992    Prior to Admission medications   Medication Sig Start Date End Date Taking? Authorizing Provider  cyclobenzaprine  (FLEXERIL) 10 MG tablet Take 1 tablet (10 mg total) by mouth at bedtime as needed for muscle spasms. 08/09/20  Yes Bedsole, Amy E, MD  HYDROcodone-acetaminophen (NORCO/VICODIN) 5-325 MG tablet Take 1 tablet by mouth every 8 (eight) hours as needed for up to 5 days for moderate pain. 08/11/20 08/16/20 Yes Bedsole, Amy E, MD  ibuprofen (ADVIL) 200 MG tablet Take 400 mg by mouth every 6 (six) hours as needed for fever, headache or mild pain.   Yes [provider]  insulin lispro (HUMALOG KWIKPEN) 100 UNIT/ML KiwkPen Inject into skin 3  times a day at meal time according to the following sliding scale: CBG 70-120 - 0 units / CBG 121-150 - 1 unit / CBG 151-200 - 2 units / CBG 201-250 - 3 units / CBG 251-300 - 5 units / CBG 301-350 - 7 units / CBG 351-400+ - 9 units  YOU MUST FOLLOW A STRICT DIABETIC DIET Patient taking differently: Inject into the skin See admin instructions. 8 units plus sliding scale Inject into skin 3 times a day at meal time according to the following sliding scale: CBG 70-120 - 0 units / CBG 121-150 - 1 unit / CBG 151-200 - 2 units / CBG 201-250 - 3 units / CBG 251-300 - 5 units / CBG 301-350 - 7 units / CBG 351-400+ - 9 units  YOU MUST FOLLOW A STRICT DIABETIC DIET 07/01/18  Yes Bedsole, Amy E, MD  levothyroxine (SYNTHROID) 175 MCG tablet Take 175 mcg by mouth daily. 06/03/20  Yes [provider]  promethazine (PHENERGAN) 25 MG tablet Take 1 TABLET BY MOUTH EVERY 8 HOUR AS NEEDED FOR NAUSEA AND VOMITINB. Patient taking differently: Take 25 mg by mouth every 8 (eight) hours as needed for nausea or vomiting. Take 1 TABLET BY MOUTH EVERY 8 HOUR AS NEEDED FOR NAUSEA AND VOMITINB. 08/09/20  Yes Bedsole, Amy E, MD  TOUJEO SOLOSTAR 300 UNIT/ML Solostar Pen Inject 24 Units into the skin at bedtime.  07/18/20  Yes [provider]  Continuous Blood Gluc Sensor (FREESTYLE LIBRE 14 DAY SENSOR) MISC by Does not apply route. 04/01/19   [provider]    fexofenadine (ALLEGRA) 180 MG tablet Take 1 tablet (180 mg total) by mouth daily. Patient not taking: Reported on 08/16/2020 02/11/20   Jinny Sanders, MD  Insulin Pen Needle (PEN NEEDLES) 31G X 8 MM MISC Use to inject insulin 4 times a day.  Dx: E11.10 12/23/17   Jinny Sanders, MD  Lancets (ONETOUCH ULTRASOFT) lancets Use to check blood sugar daily as needed.  Dx: E11.10 12/23/17   Jinny Sanders, MD  metoCLOPramide (REGLAN) 5 MG tablet Take 5mg  before breakfast lunch and dinner and 10mg  at bedtime Patient not taking: Reported on 08/16/2020 04/29/19   Milus Banister, MD  metoprolol succinate (TOPROL-XL) 25 MG 24 hr tablet Take 1 tablet (25 mg total) by mouth daily. Patient not taking: Reported on 08/16/2020 04/08/20   Minus Breeding, MD  [facility] VERIO test strip USE TO CHECK BLOOD SUGAR DAILY AS NEEDED. DX: E11.10 Patient not taking: Reported on 08/09/2020 09/09/19   Jinny Sanders, MD  pantoprazole (PROTONIX) 40 MG tablet Take 1 tablet (40 mg total) by mouth 2 (two) times daily before a meal. Patient not taking: Reported on 08/16/2020 06/03/19   Florencia Reasons, MD  predniSONE (DELTASONE) 10 MG tablet 3 tabs by mouth daily x 3 days, then 2 tabs by mouth daily x 2 days then 1 tab by mouth daily x 2 days 08/09/20   Diona Browner, Amy E, MD  rosuvastatin (CRESTOR) 40 MG tablet TAKE 1 TABLET BY MOUTH EVERY DAY Patient not taking: Reported on 08/16/2020 04/12/20   Minus Breeding, MD  sertraline (ZOLOFT) 100 MG tablet TAKE 1 TABLET BY MOUTH EVERY DAY Patient not taking: Reported on 08/16/2020 11/26/19   Jinny Sanders, MD  TRESIBA FLEXTOUCH 100 UNIT/ML SOPN FlexTouch Pen Inject 0.24 mLs (24 Units total) into the skin at bedtime. On 10/20/18 take at lunch time, on 10/21/18 take at dinner time. On 10/22/18 resume at bedtime regimen. Patient  not taking: Reported on 08/16/2020 10/20/18   Lavina Hamman, MD    Current Facility-Administered Medications  Medication Dose Route Frequency Provider Last Rate Last Admin   dextrose 5 % in  lactated ringers infusion   Intravenous Continuous Palumbo, April, MD   Held at 08/16/20 2458   dextrose 50 % solution 0-50 mL  0-50 mL Intravenous PRN Palumbo, April, MD       insulin regular, human (MYXREDLIN) 100 units/ 100 mL infusion   Intravenous Continuous Palumbo, April, MD 5.5 mL/hr at 08/16/20 0911 5.5 Units/hr at 08/16/20 0998   lactated ringers infusion   Intravenous Continuous Palumbo, April, MD 125 mL/hr at 08/16/20 0907 New Bag at 08/16/20 0907   pantoprazole (PROTONIX) 80 mg in sodium chloride 0.9 % 100 mL (0.8 mg/mL) infusion  8 mg/hr Intravenous Continuous Palumbo, April, MD       pantoprazole (PROTONIX) 80 mg in sodium chloride 0.9 % 100 mL IVPB  80 mg Intravenous Once Palumbo, April, MD       [START ON 08/19/2020] pantoprazole (PROTONIX) injection 40 mg  40 mg Intravenous Q12H Palumbo, April, MD       Current Outpatient Medications  Medication Sig Dispense Refill   cyclobenzaprine (FLEXERIL) 10 MG tablet Take 1 tablet (10 mg total) by mouth at bedtime as needed for muscle spasms. 15 tablet 0   HYDROcodone-acetaminophen (NORCO/VICODIN) 5-325 MG tablet Take 1 tablet by mouth every 8 (eight) hours as needed for up to 5 days for moderate pain. 15 tablet 0   ibuprofen (ADVIL) 200 MG tablet Take 400 mg by mouth every 6 (six) hours as needed for fever, headache or mild pain.     insulin lispro (HUMALOG KWIKPEN) 100 UNIT/ML KiwkPen Inject into skin 3 times a day at meal time according to the following sliding scale: CBG 70-120 - 0 units / CBG 121-150 - 1 unit / CBG 151-200 - 2 units / CBG 201-250 - 3 units / CBG 251-300 - 5 units / CBG 301-350 - 7 units / CBG 351-400+ - 9 units  YOU MUST FOLLOW A STRICT DIABETIC DIET (Patient taking differently: Inject into the skin See admin instructions. 8 units plus sliding scale Inject into skin 3 times a day at meal time according to the following sliding scale: CBG 70-120 - 0 units / CBG 121-150 - 1 unit / CBG 151-200 - 2 units  / CBG 201-250 - 3 units / CBG 251-300 - 5 units / CBG 301-350 - 7 units / CBG 351-400+ - 9 units  YOU MUST FOLLOW A STRICT DIABETIC DIET) 4 pen 11   levothyroxine (SYNTHROID) 175 MCG tablet Take 175 mcg by mouth daily.     promethazine (PHENERGAN) 25 MG tablet Take 1 TABLET BY MOUTH EVERY 8 HOUR AS NEEDED FOR NAUSEA AND VOMITINB. (Patient taking differently: Take 25 mg by mouth every 8 (eight) hours as needed for nausea or vomiting. Take 1 TABLET BY MOUTH EVERY 8 HOUR AS NEEDED FOR NAUSEA AND VOMITINB.) 30 tablet 1   TOUJEO SOLOSTAR 300 UNIT/ML Solostar Pen Inject 24 Units into the skin at bedtime.      Continuous Blood Gluc Sensor (FREESTYLE LIBRE 14 DAY SENSOR) MISC by Does not apply route.     fexofenadine (ALLEGRA) 180 MG tablet Take 1 tablet (180 mg total) by mouth daily. (Patient not taking: Reported on 08/16/2020) 90 tablet 3   Insulin Pen Needle (PEN NEEDLES) 31G X 8 MM MISC Use to inject insulin 4 times a day.  Dx: E11.10 130 each 11   Lancets (ONETOUCH ULTRASOFT) lancets Use to check blood sugar daily as needed.  Dx: E11.10 100 each 3   metoCLOPramide (REGLAN) 5 MG tablet Take 5mg  before breakfast lunch and dinner and 10mg  at bedtime (Patient not taking: Reported on 08/16/2020) 150 tablet 11   metoprolol succinate (TOPROL-XL) 25 MG 24 hr tablet Take 1 tablet (25 mg total) by mouth daily. (Patient not taking: Reported on 08/16/2020) 90 tablet 2   ONETOUCH VERIO test strip USE TO CHECK BLOOD SUGAR DAILY AS NEEDED. DX: E11.10 (Patient not taking: Reported on 08/09/2020) 100 strip 3   pantoprazole (PROTONIX) 40 MG tablet Take 1 tablet (40 mg total) by mouth 2 (two) times daily before a meal. (Patient not taking: Reported on 08/16/2020) 30 tablet 11   predniSONE (DELTASONE) 10 MG tablet 3 tabs by mouth daily x 3 days, then 2 tabs by mouth daily x 2 days then 1 tab by mouth daily x 2 days 15 tablet 0   rosuvastatin (CRESTOR) 40 MG tablet TAKE 1 TABLET BY MOUTH EVERY DAY (Patient not  taking: Reported on 08/16/2020) 90 tablet 3   sertraline (ZOLOFT) 100 MG tablet TAKE 1 TABLET BY MOUTH EVERY DAY (Patient not taking: Reported on 08/16/2020) 90 tablet 1   TRESIBA FLEXTOUCH 100 UNIT/ML SOPN FlexTouch Pen Inject 0.24 mLs (24 Units total) into the skin at bedtime. On 10/20/18 take at lunch time, on 10/21/18 take at dinner time. On 10/22/18 resume at bedtime regimen. (Patient not taking: Reported on 08/16/2020)  5    Allergies as of 08/16/2020 - Review Complete 08/16/2020  Allergen Reaction Noted   Codeine Hives, Anxiety, and Other (See Comments)     Family History  Problem Relation Age of Onset   Diabetes Other    Heart disease Other    Heart failure Mother 83       Her heart skipped a beat and stopped - per pt   Diabetes Maternal Grandmother    Alzheimer's disease Maternal Grandmother    Coronary artery disease Maternal Grandmother    Heart attack Maternal Grandfather    Heart failure Brother    Diabetes Brother    Colon polyps Neg Hx    Esophageal cancer Neg Hx    Liver cancer Neg Hx    Pancreatic cancer Neg Hx    Stomach cancer Neg Hx    Crohn's disease Neg Hx    Rectal cancer Neg Hx     Social History   Socioeconomic History   Marital status: Married    Spouse name: Not on file   Number of children: 2   Years of education: Not on file   Highest education level: Not on file  Occupational History   Occupation: UNEMPLOYED    Employer: UNEMPLOYED    Comment: homemaker  Tobacco Use   Smoking status: Former Smoker    Packs/day: 0.12    Years: 10.00    Pack years: 1.20    Types: Cigarettes    Quit date: 12/10/1990    Years since quitting: 29.7   Smokeless tobacco: Never Used  Vaping Use   Vaping Use: Never used  Substance and Sexual Activity   Alcohol use: Never    Alcohol/week: 0.0 standard drinks   Drug use: Yes    Types: Marijuana    Comment: last use 2 days ago   Sexual activity: Not Currently  Other Topics Concern    Not on file  Social History Narrative   Regular  exercise- no    Diet- lots of mountain dew, limits fast foods    Social Determinants of Health   Financial Resource Strain:    Difficulty of Paying Living Expenses: Not on file  Food Insecurity:    Worried About [place] in the Last Year: Not on file   Ran Out of Food in the Last Year: Not on file  Transportation Needs:    Lack of Transportation (Medical): Not on file   Lack of Transportation (Non-Medical): Not on file  Physical Activity:    Days of Exercise per Week: Not on file   Minutes of Exercise per Session: Not on file  Stress:    Feeling of Stress : Not on file  Social Connections:    Frequency of Communication with Friends and Family: Not on file   Frequency of Social Gatherings with Friends and Family: Not on file   Attends Religious Services: Not on file   Active Member of Clubs or Organizations: Not on file   Attends Archivist Meetings: Not on file   Marital Status: Not on file  Intimate Partner Violence:    Fear of Current or Ex-Partner: Not on file   Emotionally Abused: Not on file   Physically Abused: Not on file   Sexually Abused: Not on file    Review of Systems: All systems reviewed and negative except where noted in HPI.  PREVIOUS ENDOSCOPIC STUDIES / IMAGING:      OBJECTIVE:    Physical Exam: Vital signs in last 24 hours: Temp:  [97.7 F (36.5 C)-98.4 F (36.9 C)] 97.7 F (36.5 C) (09/07 0822) Pulse Rate:  [67-138] 67 (09/07 0822) Resp:  [12-20] 12 (09/07 0822) BP: (90-145)/(77-99) 145/98 (09/07 0822) SpO2:  [98 %-99 %] 98 % (09/07 3154)   General:   Alert, well-developed,  female in NAD Psych:  Pleasant, cooperative. Normal mood and affect. Eyes:  Pupils equal, sclera clear, no icterus.   Conjunctiva pink. Ears:  Normal auditory acuity. Nose:  No deformity, discharge,  or lesions. Neck:  Supple; no masses Lungs:  Clear throughout to auscultation.    No wheezes, crackles, or rhonchi.  Heart:  Sinus tachycardia, no lower extremity edema Abdomen:  Soft, mildly distended with a few BS,  no palp mass   Rectal:  Deferred  Msk:  Symmetrical without gross deformities. . Neurologic:  Alert and  oriented x4;  grossly normal neurologically. Skin:  Intact without significant lesions or rashes.  There were no vitals filed for this visit.   Scheduled inpatient medications  [START ON 08/19/2020] pantoprazole  40 mg Intravenous Q12H      Intake/Output from previous day: No intake/output data recorded. Intake/Output this shift: No intake/output data recorded.   Lab Results: Recent Labs    08/15/20 1404 08/16/20 0238  WBC 12.0* 26.5*  HGB 17.9* 18.8*  HCT 52.3* 55.8*  PLT 399 439*   BMET Recent Labs    08/15/20 1404 08/16/20 0238  NA 133* 135  K 4.0 3.0*  CL 94* 90*  CO2 22 13*  GLUCOSE 312* 502*  BUN 25* 30*  CREATININE 0.65 0.86  CALCIUM 9.6 9.8   LFT Recent Labs    08/16/20 0238  PROT 8.2*  ALBUMIN 4.8  AST 16  ALT 12  ALKPHOS 55  BILITOT 1.4*   PT/INR Recent Labs    08/16/20 0550  LABPROT 14.8  INR 1.2   Hepatitis Panel No results for input(s): HEPBSAG, HCVAB, HEPAIGM, HEPBIGM in the  last 72 hours.   . CBC Latest Ref Rng & Units 08/16/2020 08/15/2020 07/11/2020  WBC 4.0 - 10.5 K/uL 26.5(H) 12.0(H) 10.5  Hemoglobin 12.0 - 15.0 g/dL 18.8(H) 17.9(H) 15.7  Hematocrit 36 - 46 % 55.8(H) 52.3(H) 48.3(H)  Platelets 150 - 400 K/uL 439(H) 399 318    . CMP Latest Ref Rng & Units 08/16/2020 08/15/2020 07/11/2020  Glucose 70 - 99 mg/dL 502(HH) 312(H) 373(H)  BUN 6 - 20 mg/dL 30(H) 25(H) 11  Creatinine 0.44 - 1.00 mg/dL 0.86 0.65 0.74  Sodium 135 - 145 mmol/L 135 133(L) 132(L)  Potassium 3.5 - 5.1 mmol/L 3.0(L) 4.0 4.6  Chloride 98 - 111 mmol/L 90(L) 94(L) 93(L)  CO2 22 - 32 mmol/L 13(L) 22 24  Calcium 8.9 - 10.3 mg/dL 9.8 9.6 9.5  Total Protein 6.5 - 8.1 g/dL 8.2(H) - -  Total Bilirubin 0.3 - 1.2 mg/dL 1.4(H) - -   Alkaline Phos 38 - 126 U/L 55 - -  AST 15 - 41 U/L 16 - -  ALT 0 - 44 U/L 12 - -   Studies/Results: DG Chest 2 View  Result Date: 08/15/2020 CLINICAL DATA:  Chest pain. EXAM: CHEST - 2 VIEW COMPARISON:  May 30, 2019. FINDINGS: The heart size and mediastinal contours are within normal limits. Both lungs are clear. No pneumothorax or pleural effusion is noted. The visualized skeletal structures are unremarkable. IMPRESSION: No active cardiopulmonary disease. Electronically Signed   By: Marijo Conception M.D.   On: 08/15/2020 14:41   CT ABDOMEN PELVIS W CONTRAST  Result Date: 08/16/2020 CLINICAL DATA:  Abdominal pain. Patient complains of left upper quadrant pain. EXAM: CT ABDOMEN AND PELVIS WITH CONTRAST TECHNIQUE: Multidetector CT imaging of the abdomen and pelvis was performed using the standard protocol following bolus administration of intravenous contrast. CONTRAST:  179mL OMNIPAQUE IOHEXOL 300 MG/ML  SOLN COMPARISON:  CT of the abdomen and pelvis 05/31/2019. FINDINGS: Lower chest: The lung bases are clear without focal nodule, mass, or airspace disease. Heart size is normal. No significant pleural or pericardial effusion is present. Hepatobiliary: No focal liver abnormality is seen. No gallstones, gallbladder wall thickening, or biliary dilatation. 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 Pancreas: Unremarkable. No pancreatic ductal dilatation or surrounding inflammatory changes. Spleen: Normal in size without focal abnormality. Adrenals/Urinary Tract: Adrenal glands are unremarkable. Kidneys are normal, without renal calculi, focal lesion, or hydronephrosis. Bladder is moderately distended. No obstructing lesion is present. Stomach/Bowel: Circumferential thickening is present in the distal esophagus. The stomach is markedly distended. No obstructing lesion is present. Proximal duodenum is unremarkable. Similar appearance was present previously. The small bowel is  within normal limits. Terminal ileum is normal. The appendix is visualized and normal. The ascending and transverse colon are within normal limits. Descending and sigmoid colon are mostly collapsed, within normal limits. Vascular/Lymphatic: No significant vascular findings are present. No enlarged abdominal or pelvic lymph nodes. Reproductive: Status post hysterectomy. No adnexal masses. Other: No abdominal wall hernia or abnormality. No abdominopelvic ascites. Musculoskeletal: No acute or significant osseous findings. IMPRESSION: 1. Circumferential thickening in the distal esophagus compatible with esophagitis. 2. Marked distention of the stomach without an obstructing lesion. Question gastroparesis. Given similar appearance of gastric distension with previous duodenal distension, SMA syndrome is also considered. 3. Status post hysterectomy. Electronically Signed   By: San Morelle M.D.   On: 08/16/2020 05:30   DG Abd Acute W/Chest  Result Date: 08/16/2020 CLINICAL DATA:  Mid abdominal pain and nausea for  48 hours EXAM: DG ABDOMEN ACUTE W/ 1V CHEST COMPARISON:  Abdomen 06/01/2019 FINDINGS: Normal heart size and pulmonary vascularity. Emphysematous changes in the lungs. No airspace disease or consolidation. No pleural effusions. No pneumothorax. Mediastinal contours appear intact. Scattered gas and stool throughout the colon with some gas-filled small bowel. No small or large bowel distention. No free intra-abdominal air. No abnormal air-fluid levels. No radiopaque stones. Visualized bones and soft tissue contours appear intact. IMPRESSION: Emphysematous changes in the lungs. No evidence of active pulmonary disease. Nonobstructive bowel gas pattern. Electronically Signed   By: Lucienne Capers M.D.   On: 08/16/2020 04:23    Active Problems:   * No active hospital problems. Tye Savoy, NP-C @  08/16/2020, 9:46 AM

## 2020-08-16 NOTE — Progress Notes (Signed)
Put patient on for EGD Thursday. However, we will round on her in am and if patient and her labs look okay then we may possibly get it done tomorrow afternoon.

## 2020-08-16 NOTE — ED Notes (Signed)
Date and time results received: 08/16/20 0437 (use smartphrase ".now" to insert current time)  Test: Glucose Critical Value: 502  Name of Provider Notified: Palumbo MD  Orders Received? Or Actions Taken?: waiting on orders

## 2020-08-16 NOTE — Progress Notes (Signed)
eLink Physician-Brief Progress Note Patient Name: [name] [name] DOB: 06-22-1961 MRN: [id number]   Date of Service  08/16/2020  HPI/Events of Note  Dr Randal Buba from ED discussion. 59 yr old female 1). 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 2). Hemetemesis. stable MAP and Hg. CT abdomen; gastroparesis/esophagitis: ICU admission request Getting NG tube  Insuline gtt/fluids. VBG Follow Hg. GI help. SCD Non ischemic CMP; watch for CHF. Troponin at 4. Trend troponin Asp precautions.  Protonix gtt.    eICU Interventions  As above. Bed side MD team notified.      Intervention Category Major Interventions: Hemorrhage - evaluation and management;Hyperglycemia - active titration of insulin therapy Intermediate Interventions: Communication with other healthcare providers and/or family  Elmer Sow 08/16/2020, 6:01 AM

## 2020-08-16 NOTE — ED Notes (Signed)
When ask to lay on back for EKG PT became upset state " damn give me a min. I just told you. Shit yall not even helping me" When ask to watch language PT refuse. RN info

## 2020-08-16 NOTE — ED Triage Notes (Signed)
Patient arrived with complaints up LUQ pain and vomiting over the day. Patient given 4 mg zofran en route.

## 2020-08-16 NOTE — ED Provider Notes (Signed)
[facility] DEPT Provider Note   CSN: [id number] Arrival date & time: 08/16/20  0211     History Chief Complaint  Patient presents with  . Abdominal Pain    [name] [name] is a 59 y.o. female.  The history is provided by the patient. The history is limited by the condition of the patient.  Abdominal Pain Pain location:  LUQ Pain quality: cramping and fullness   Pain radiates to:  Does not radiate Pain severity:  Severe Onset quality:  Gradual Duration:  36 hours Timing:  Constant Progression:  Worsening Chronicity:  New Context: not eating and not trauma   Relieved by:  Nothing Worsened by:  Nothing Ineffective treatments:  None tried Associated symptoms: anorexia, nausea and vomiting   Associated symptoms: no chest pain, no constipation, no cough and no fever   Risk factors: not pregnant   Patient with a h/o of Diabetes and DKA presents with 36 hours of LUQ pain and nausea/vomiting.  No diarrhea.  No f/c/r.  No urinary symptoms.  Is fully covid vaccinated.  States she has not taken her insulin since yesterday.        Past Medical History:  Diagnosis Date  . Allergy    seasonal  . Anxiety   . B12 deficiency   . Chest pain 04/05/2017  . CHF (congestive heart failure) ([place])   . Diabetes mellitus type 1, uncontrolled ([place])    "dr. just changed me to type 1, uncontrolled" (11/26/2018)  . DKA (diabetic ketoacidoses) ([place]) 05/25/2018  . GERD (gastroesophageal reflux disease)   . Hyperlipidemia   . Hypertension    [name]  . Hypothyroidism   . IBS (irritable bowel syndrome)   . Intermittent vertigo   . 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 Leg cramping    "@ night" (09/18/2013)  . Migraine    "once q couple months" (11/26/2018)  . Osteoporosis   . Peripheral neuropathy   . Umbilical hernia    unrepaired (09/18/2013)    Patient Active Problem List   Diagnosis Date Noted  . Acute pain of right shoulder  08/09/2020  . Left foot pain 06/17/2020  . Cellulitis of left hand 06/14/2020  . Laceration of left hand without foreign body 06/14/2020  . Need for prophylactic vaccination against cholera with typhoid-paratyphoid (cholera + TAB) vaccine 02/11/2020  . Urinary frequency 02/10/2020  . Chronic diastolic HF (heart failure) ([place]) 10/12/2019  . Duodenal ulcer without hemorrhage or perforation   . Esophagitis   . Enteritis   . Snoring 04/10/2019  . Routine general medical examination at a health care facility 04/10/2019  . Constipation   . Nausea and vomiting 03/25/2019  . SBO (small bowel obstruction) ([place]) 11/26/2018  . Tachycardia 08/04/2018  . MDD (major depressive disorder), single episode, mild ([place]) 07/01/2018  . Colitis 03/07/2018  . Chronic chest pain   . Chronic systolic CHF (congestive heart failure) ([place]) 01/14/2018  . Noncompliance 01/14/2018  . Migraines 12/11/2017  . Chest discomfort 09/18/2017  . Phrygian cap 06/20/2017  . Leukocytosis   . Abdominal pain   . Colitis presumed infectious   . Nonischemic cardiomyopathy ([place]) 03/22/2017  . Hypokalemia 02/15/2017  . NSTEMI (non-ST elevated myocardial infarction) ([place]) 02/11/2017  . H. pylori infection 02/09/2017  . Hypomagnesemia 02/09/2017  . Gastroparesis due to DM ([place]) 01/22/2017  . Type 2 diabetes mellitus with neurological complications ([place]) 14/43/1540  . Paroxysmal A-fib ([place]) 02/01/2015  . Kidney disease, chronic, stage  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) 10/29/2013  . Hepatic lesion 09/18/2013  . Vitamin D deficiency 05/20/2012  . Essential hypertension 02/01/2011  . Osteoporosis 02/01/2011  . B12 deficiency 04/04/2010  . GERD 04/08/2009  . Generalized anxiety disorder 06/22/2008  . Hypothyroidism 04/29/2008  . Hyperlipidemia 04/29/2008  . Irritable bowel syndrome 04/29/2008    Past Surgical History:  Procedure Laterality Date  . BIOPSY  06/03/2019   Procedure: BIOPSY;  Surgeon: Jackquline Denmark, MD;  Location: [facility]  ENDOSCOPY;  Service: Endoscopy;;  . [place]  . ENTEROSCOPY N/A 06/03/2019   Procedure: ENTEROSCOPY;  Surgeon: Jackquline Denmark, MD;  Location: [facility];  Service: Endoscopy;  Laterality: N/A;  . LAPAROSCOPIC ASSISTED VAGINAL HYSTERECTOMY  09/23/2012   Procedure: LAPAROSCOPIC ASSISTED VAGINAL HYSTERECTOMY;  Surgeon: Linda Hedges, DO;  Location: [facility];  Service: Gynecology;  Laterality: N/A;  pull Dr Gregor Hams instrument  . LEFT HEART CATH AND CORONARY ANGIOGRAPHY N/A 02/11/2017   Procedure: Left Heart Cath and Coronary Angiography;  Surgeon: Lorretta Harp, MD;  Location: [facility];  Service: Cardiovascular;  Laterality: N/A;  . TUBAL LIGATION Bilateral 1992     OB History    Gravida  2   Para  2   Term      Preterm      AB      Living        SAB      TAB      Ectopic      Multiple      Live Births              Family History  Problem Relation Age of Onset  . Diabetes Other   . Heart disease Other   . Heart failure Mother 45       Her heart skipped a beat and stopped - per pt  . Diabetes Maternal Grandmother   . Alzheimer's disease Maternal Grandmother   . Coronary artery disease Maternal Grandmother   . Heart attack Maternal Grandfather   . Heart failure Brother   . Diabetes Brother   . Colon polyps Neg Hx   . Esophageal cancer Neg Hx   . Liver cancer Neg Hx   . Pancreatic cancer Neg Hx   . Stomach cancer Neg Hx   . Crohn's disease Neg Hx   . Rectal cancer Neg Hx     Social History   Tobacco Use  . Smoking status: Former Smoker    Packs/day: 0.12    Years: 10.00    Pack years: 1.20    Types: Cigarettes    Quit date: 12/10/1990    Years since quitting: 29.7  . Smokeless tobacco: Never Used  Vaping Use  . Vaping Use: Never used  Substance Use Topics  . Alcohol use: Never    Alcohol/week: 0.0 standard drinks  . Drug use: Yes    Types: Marijuana    Comment: last use 2 days ago    Home Medications Prior to Admission  medications   Medication Sig Start Date End Date Taking? Authorizing Provider  Continuous Blood Gluc Sensor (FREESTYLE LIBRE 14 DAY SENSOR) MISC by Does not apply route. 04/01/19   [provider]  cyclobenzaprine (FLEXERIL) 10 MG tablet Take 1 tablet (10 mg total) by mouth at bedtime as needed for muscle spasms. 08/09/20   Bedsole, Amy E, MD  fexofenadine (ALLEGRA) 180 MG tablet Take 1 tablet (180 mg total) by mouth daily. 02/11/20   Jinny Sanders, MD  gabapentin (NEURONTIN) 300 MG capsule Take 300 mg by mouth at bedtime as needed. 06/21/20   [provider]  HYDROcodone-acetaminophen (NORCO/VICODIN) 5-325 MG tablet Take 1 tablet by mouth every 8 (eight) hours as needed for up to 5 days for moderate pain. 08/11/20 08/16/20  Bedsole, Amy E, MD  insulin lispro (HUMALOG KWIKPEN) 100 UNIT/ML KiwkPen Inject into skin 3 times a day at meal time according to the following sliding scale: CBG 70-120 - 0 units / CBG 121-150 - 1 unit / CBG 151-200 - 2 units / CBG 201-250 - 3 units / CBG 251-300 - 5 units / CBG 301-350 - 7 units / CBG 351-400+ - 9 units  YOU MUST FOLLOW A STRICT DIABETIC DIET Patient taking differently: Inject into the skin See admin instructions. 8 units plus sliding scale Inject into skin 3 times a day at meal time according to the following sliding scale: CBG 70-120 - 0 units / CBG 121-150 - 1 unit / CBG 151-200 - 2 units / CBG 201-250 - 3 units / CBG 251-300 - 5 units / CBG 301-350 - 7 units / CBG 351-400+ - 9 units  YOU MUST FOLLOW A STRICT DIABETIC DIET 07/01/18   Bedsole, Amy E, MD  Insulin Pen Needle (PEN NEEDLES) 31G X 8 MM MISC Use to inject insulin 4 times a day.  Dx: E11.10 12/23/17   Jinny Sanders, MD  Lancets (ONETOUCH ULTRASOFT) lancets Use to check blood sugar daily as needed.  Dx: E11.10 12/23/17   Jinny Sanders, MD  levothyroxine (SYNTHROID) 175 MCG tablet Take 175 mcg by mouth daily. 06/03/20   [provider]  metoCLOPramide (REGLAN) 5 MG  tablet Take 5mg  before breakfast lunch and dinner and 10mg  at bedtime 04/29/19   Milus Banister, MD  metoprolol succinate (TOPROL-XL) 25 MG 24 hr tablet Take 1 tablet (25 mg total) by mouth daily. 04/08/20   Minus Breeding, MD  ONETOUCH VERIO test strip USE TO CHECK BLOOD SUGAR DAILY AS NEEDED. DX: E11.10 Patient not taking: Reported on 08/09/2020 09/09/19   Jinny Sanders, MD  pantoprazole (PROTONIX) 40 MG tablet Take 1 tablet (40 mg total) by mouth 2 (two) times daily before a meal. 06/03/19   Florencia Reasons, MD  predniSONE (DELTASONE) 10 MG tablet 3 tabs by mouth daily x 3 days, then 2 tabs by mouth daily x 2 days then 1 tab by mouth daily x 2 days 08/09/20   Jinny Sanders, MD  promethazine (PHENERGAN) 25 MG tablet Take 1 TABLET BY MOUTH EVERY 8 HOUR AS NEEDED FOR NAUSEA AND VOMITINB. 08/09/20   Bedsole, Amy E, MD  rosuvastatin (CRESTOR) 40 MG tablet TAKE 1 TABLET BY MOUTH EVERY DAY 04/12/20   Minus Breeding, MD  sertraline (ZOLOFT) 100 MG tablet TAKE 1 TABLET BY MOUTH EVERY DAY 11/26/19   Bedsole, Amy E, MD  TOUJEO SOLOSTAR 300 UNIT/ML Solostar Pen SMARTSIG:24 Unit(s) SUB-Q Every Night 07/18/20   [provider]  TRESIBA FLEXTOUCH 100 UNIT/ML SOPN FlexTouch Pen Inject 0.24 mLs (24 Units total) into the skin at bedtime. On 10/20/18 take at lunch time, on 10/21/18 take at dinner time. On 10/22/18 resume at bedtime regimen. 10/20/18   Lavina Hamman, MD    Allergies    Codeine  Review of Systems   Review of Systems  Constitutional: Negative for fever.  HENT: Negative for congestion.   Eyes: Negative for visual disturbance.  Respiratory: Negative for cough.   Cardiovascular: Negative for chest pain.  Gastrointestinal: Positive for abdominal pain, anorexia, nausea and vomiting. Negative for constipation.  Genitourinary: Negative for difficulty urinating.  Musculoskeletal: Negative for arthralgias.  Skin: Negative for color change.  Neurological: Negative for dizziness.    Psychiatric/Behavioral: Negative for agitation.  All other systems reviewed and are negative.   Physical Exam Updated Vital Signs BP (!) 129/99 (BP Location: Left Arm)   Pulse (!) 138   Temp 97.7 F (36.5 C) (Oral)   Resp 20   LMP 08/11/2012   SpO2 99%   Physical Exam Vitals and nursing note reviewed.  Constitutional:      Appearance: Normal appearance. She is ill-appearing.  HENT:     Head: Normocephalic and atraumatic.     Nose: Nose normal.  Eyes:     Conjunctiva/sclera: Conjunctivae normal.     Pupils: Pupils are equal, round, and reactive to light.  Cardiovascular:     Rate and Rhythm: Regular rhythm. Tachycardia present.     Pulses: Normal pulses.     Heart sounds: Normal heart sounds.  Pulmonary:     Effort: Pulmonary effort is normal. No respiratory distress.     Breath sounds: Normal breath sounds. No wheezing or rales.  Abdominal:     General: Abdomen is flat. Bowel sounds are normal.     Palpations: Abdomen is soft.     Tenderness: There is no abdominal tenderness. There is no guarding or rebound.  Musculoskeletal:        General: Normal range of motion.     Cervical back: Normal range of motion and neck supple.     Right lower leg: No edema.     Left lower leg: No edema.  Skin:    General: Skin is warm and dry.     Capillary Refill: Capillary refill takes less than 2 seconds.  Neurological:     General: No focal deficit present.     Mental Status: She is alert.     Deep Tendon Reflexes: Reflexes normal.  Psychiatric:        Mood and Affect: Mood normal.        Behavior: Behavior normal.     ED Results / Procedures / Treatments   Labs (all labs ordered are listed, but only abnormal results are displayed) Results for orders placed or performed during the hospital encounter of 08/16/20  Lipase, blood  Result Value Ref Range   Lipase 21 11 - 51 U/L  Comprehensive metabolic panel  Result Value Ref Range   Sodium 135 135 - 145 mmol/L   Potassium  3.0 (L) 3.5 - 5.1 mmol/L   Chloride 90 (L) 98 - 111 mmol/L   CO2 13 (L) 22 - 32 mmol/L   Glucose, Bld 502 (HH) 70 - 99 mg/dL   BUN 30 (H) 6 - 20 mg/dL   Creatinine, Ser 0.86 0.44 - 1.00 mg/dL   Calcium 9.8 8.9 - 10.3 mg/dL   Total Protein 8.2 (H) 6.5 - 8.1 g/dL   Albumin 4.8 3.5 - 5.0 g/dL   AST 16 15 - 41 U/L   ALT 12 0 - 44 U/L   Alkaline Phosphatase 55 38 - 126 U/L   Total Bilirubin 1.4 (H) 0.3 - 1.2 mg/dL   GFR calc non Af Amer >60 >60 mL/min   GFR calc Af Amer >60 >60 mL/min   Anion gap 32 (H) 5 - 15  CBC  Result Value Ref Range   WBC 26.5 (H) 4.0 - 10.5 K/uL   RBC 6.32 (H)  3.87 - 5.11 MIL/uL   Hemoglobin 18.8 (H) 12.0 - 15.0 g/dL   HCT 55.8 (H) 36 - 46 %   MCV 88.3 80.0 - 100.0 fL   MCH 29.7 26.0 - 34.0 pg   MCHC 33.7 30.0 - 36.0 g/dL   RDW 13.5 11.5 - 15.5 %   Platelets 439 (H) 150 - 400 K/uL   nRBC 0.0 0.0 - 0.2 %  Blood gas, venous  Result Value Ref Range   FIO2 21.00    pH, Ven 7.327 7.25 - 7.43   pCO2, Ven 28.3 (L) 44 - 60 mmHg   pO2, Ven 59.4 (H) 32 - 45 mmHg   Bicarbonate 14.4 (L) 20.0 - 28.0 mmol/L   Acid-base deficit 9.7 (H) 0.0 - 2.0 mmol/L   O2 Saturation 86.7 %   Patient temperature 98.6   CBG monitoring, ED  Result Value Ref Range   Glucose-Capillary >600 (HH) 70 - 99 mg/dL   DG Chest 2 View  Result Date: 08/15/2020 CLINICAL DATA:  Chest pain. EXAM: CHEST - 2 VIEW COMPARISON:  May 30, 2019. FINDINGS: The heart size and mediastinal contours are within normal limits. Both lungs are clear. No pneumothorax or pleural effusion is noted. The visualized skeletal structures are unremarkable. IMPRESSION: No active cardiopulmonary disease. Electronically Signed   By: Marijo Conception M.D.   On: 08/15/2020 14:41   CT ABDOMEN PELVIS W CONTRAST  Result Date: 08/16/2020 CLINICAL DATA:  Abdominal pain. Patient complains of left upper quadrant pain. EXAM: CT ABDOMEN AND PELVIS WITH CONTRAST TECHNIQUE: Multidetector CT imaging of the abdomen and pelvis was performed  using the standard protocol following bolus administration of intravenous contrast. CONTRAST:  173mL OMNIPAQUE IOHEXOL 300 MG/ML  SOLN COMPARISON:  CT of the abdomen and pelvis 05/31/2019. FINDINGS: Lower chest: The lung bases are clear without focal nodule, mass, or airspace disease. Heart size is normal. No significant pleural or pericardial effusion is present. Hepatobiliary: No focal liver abnormality is seen. No gallstones, gallbladder wall thickening, or biliary dilatation. 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 Pancreas: Unremarkable. No pancreatic ductal dilatation or surrounding inflammatory changes. Spleen: Normal in size without focal abnormality. Adrenals/Urinary Tract: Adrenal glands are unremarkable. Kidneys are normal, without renal calculi, focal lesion, or hydronephrosis. Bladder is moderately distended. No obstructing lesion is present. Stomach/Bowel: Circumferential thickening is present in the distal esophagus. The stomach is markedly distended. No obstructing lesion is present. Proximal duodenum is unremarkable. Similar appearance was present previously. The small bowel is within normal limits. Terminal ileum is normal. The appendix is visualized and normal. The ascending and transverse colon are within normal limits. Descending and sigmoid colon are mostly collapsed, within normal limits. Vascular/Lymphatic: No significant vascular findings are present. No enlarged abdominal or pelvic lymph nodes. Reproductive: Status post hysterectomy. No adnexal masses. Other: No abdominal wall hernia or abnormality. No abdominopelvic ascites. Musculoskeletal: No acute or significant osseous findings. IMPRESSION: 1. Circumferential thickening in the distal esophagus compatible with esophagitis. 2. Marked distention of the stomach without an obstructing lesion. Question gastroparesis. Given similar appearance of gastric distension with previous duodenal distension, SMA  syndrome is also considered. 3. Status post hysterectomy. Electronically Signed   By: San Morelle M.D.   On: 08/16/2020 05:30   DG Abd Acute W/Chest  Result Date: 08/16/2020 CLINICAL DATA:  Mid abdominal pain and nausea for 48 hours EXAM: DG ABDOMEN ACUTE W/ 1V CHEST COMPARISON:  Abdomen 06/01/2019 FINDINGS: Normal heart size and pulmonary vascularity. Emphysematous changes in  the lungs. No airspace disease or consolidation. No pleural effusions. No pneumothorax. Mediastinal contours appear intact. Scattered gas and stool throughout the colon with some gas-filled small bowel. No small or large bowel distention. No free intra-abdominal air. No abnormal air-fluid levels. No radiopaque stones. Visualized bones and soft tissue contours appear intact. IMPRESSION: Emphysematous changes in the lungs. No evidence of active pulmonary disease. Nonobstructive bowel gas pattern. Electronically Signed   By: Lucienne Capers M.D.   On: 08/16/2020 04:23   MYOCARDIAL PERFUSION IMAGING  Result Date: 07/20/2020  Nuclear stress EF: 64%.  The left ventricular ejection fraction is normal (55-65%).  There was no ST segment deviation noted during stress.  The study is normal.  This is a low risk study.  No ischemia.  Inferior and apical hypokinesis. Suspect that this is artifact. Consider echo to assess wall motion.     EKG EKG Interpretation  Date/Time:  Tuesday August 16 2020 05:25:39 EDT Ventricular Rate:  142 PR Interval:    QRS Duration: 89 QT Interval:  311 QTC Calculation: 478 R Axis:   117 Text Interpretation: Multifocal atrial tachycardia Right axis deviation Borderline T wave abnormalities Confirmed by Dory Horn) on 08/16/2020 5:35:11 AM   Radiology DG Chest 2 View  Result Date: 08/15/2020 CLINICAL DATA:  Chest pain. EXAM: CHEST - 2 VIEW COMPARISON:  May 30, 2019. FINDINGS: The heart size and mediastinal contours are within normal limits. Both lungs are clear. No  pneumothorax or pleural effusion is noted. The visualized skeletal structures are unremarkable. IMPRESSION: No active cardiopulmonary disease. Electronically Signed   By: Marijo Conception M.D.   On: 08/15/2020 14:41   CT ABDOMEN PELVIS W CONTRAST  Result Date: 08/16/2020 CLINICAL DATA:  Abdominal pain. Patient complains of left upper quadrant pain. EXAM: CT ABDOMEN AND PELVIS WITH CONTRAST TECHNIQUE: Multidetector CT imaging of the abdomen and pelvis was performed using the standard protocol following bolus administration of intravenous contrast. CONTRAST:  165mL OMNIPAQUE IOHEXOL 300 MG/ML  SOLN COMPARISON:  CT of the abdomen and pelvis 05/31/2019. FINDINGS: Lower chest: The lung bases are clear without focal nodule, mass, or airspace disease. Heart size is normal. No significant pleural or pericardial effusion is present. Hepatobiliary: No focal liver abnormality is seen. No gallstones, gallbladder wall thickening, or biliary dilatation. 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 Pancreas: Unremarkable. No pancreatic ductal dilatation or surrounding inflammatory changes. Spleen: Normal in size without focal abnormality. Adrenals/Urinary Tract: Adrenal glands are unremarkable. Kidneys are normal, without renal calculi, focal lesion, or hydronephrosis. Bladder is moderately distended. No obstructing lesion is present. Stomach/Bowel: Circumferential thickening is present in the distal esophagus. The stomach is markedly distended. No obstructing lesion is present. Proximal duodenum is unremarkable. Similar appearance was present previously. The small bowel is within normal limits. Terminal ileum is normal. The appendix is visualized and normal. The ascending and transverse colon are within normal limits. Descending and sigmoid colon are mostly collapsed, within normal limits. Vascular/Lymphatic: No significant vascular findings are present. No enlarged abdominal or pelvic lymph nodes.  Reproductive: Status post hysterectomy. No adnexal masses. Other: No abdominal wall hernia or abnormality. No abdominopelvic ascites. Musculoskeletal: No acute or significant osseous findings. IMPRESSION: 1. Circumferential thickening in the distal esophagus compatible with esophagitis. 2. Marked distention of the stomach without an obstructing lesion. Question gastroparesis. Given similar appearance of gastric distension with previous duodenal distension, SMA syndrome is also considered. 3. Status post hysterectomy. Electronically Signed   By: Wynetta Fines.D.  On: 08/16/2020 05:30   DG Abd Acute W/Chest  Result Date: 08/16/2020 CLINICAL DATA:  Mid abdominal pain and nausea for 48 hours EXAM: DG ABDOMEN ACUTE W/ 1V CHEST COMPARISON:  Abdomen 06/01/2019 FINDINGS: Normal heart size and pulmonary vascularity. Emphysematous changes in the lungs. No airspace disease or consolidation. No pleural effusions. No pneumothorax. Mediastinal contours appear intact. Scattered gas and stool throughout the colon with some gas-filled small bowel. No small or large bowel distention. No free intra-abdominal air. No abnormal air-fluid levels. No radiopaque stones. Visualized bones and soft tissue contours appear intact. IMPRESSION: Emphysematous changes in the lungs. No evidence of active pulmonary disease. Nonobstructive bowel gas pattern. Electronically Signed   By: Lucienne Capers M.D.   On: 08/16/2020 04:23    Procedures Procedures (including critical care time)  Medications Ordered in ED Medications  lactated ringers bolus 1,034 mL (has no administration in time range)  insulin regular, human (MYXREDLIN) 100 units/ 100 mL infusion (has no administration in time range)  lactated ringers infusion (has no administration in time range)  dextrose 5 % in lactated ringers infusion (has no administration in time range)  dextrose 50 % solution 0-50 mL (has no administration in time range)  potassium chloride 10  mEq in 100 mL IVPB (has no administration in time range)  ondansetron (ZOFRAN) injection 4 mg (has no administration in time range)  fentaNYL (SUBLIMAZE) injection 25 mcg (has no administration in time range)  pantoprazole (PROTONIX) 80 mg in sodium chloride 0.9 % 100 mL IVPB (has no administration in time range)  pantoprazole (PROTONIX) 80 mg in sodium chloride 0.9 % 100 mL (0.8 mg/mL) infusion (has no administration in time range)  pantoprazole (PROTONIX) injection 40 mg (has no administration in time range)  iohexol (OMNIPAQUE) 300 MG/ML solution 100 mL (100 mLs Intravenous Contrast Given 08/16/20 3235)    ED Course  I have reviewed the triage vital signs and the nursing notes.  Pertinent labs & imaging results that were available during my care of the patient were reviewed by me and considered in my medical decision making (see chart for details).   IVs placed, IVF and Insulin initiated along with protonix drip.    MDM Reviewed: previous chart, nursing note and vitals Reviewed previous: labs and CT scan Interpretation: labs, CT scan and ECG (markedly elevated glucose, positive anion gap 32 acidotic on VBG, gastric distention on CT by me ) Total time providing critical care: 75-105 minutes (insulin drip and protonix drip  ). This excludes time spent performing separately reportable procedures and services. Consults: critical care and gastrointestinal  MDM Number of Diagnoses or Management Options Critical Care Total time providing critical care: 75-105 minutes (insulin drip and protonix drip  )  CRITICAL CARE Performed by: Yusra Ravert K Emanuella Nickle-Rasch Total critical care time: 90 minutes Critical care time was exclusive of separately billable procedures and treating other patients. Critical care was necessary to treat or prevent imminent or life-threatening deterioration. Critical care was time spent personally by me on the following activities: development of treatment plan with patient  and/or surrogate as well as nursing, discussions with consultants, evaluation of patient's response to treatment, examination of patient, obtaining history from patient or surrogate, ordering and performing treatments and interventions, ordering and review of laboratory studies, ordering and review of radiographic studies, pulse oximetry and re-evaluation of patient's condition.  Final Clinical Impression(s) / ED Diagnoses Final diagnoses:  Diabetic ketoacidosis without coma associated with type 1 diabetes mellitus ([name])  Hematemesis, presence of nausea  not specified    Admit to ICU, GI consulted    [place], Roseana Rhine, MD 08/16/20 [id number]

## 2020-08-16 NOTE — H&P (View-Only) (Signed)
Referring Provider:  [facility]         Primary Care Physician:  Jinny Sanders, MD Primary Gastroenterologist: Oretha Caprice, MD                 We were asked to see this patient for:     hematemesis             ASSESSMENT / PLAN:   # Acute on chronic nausea and acute hematemesis --Gastric distention without obstructing lesion on CT scan. Could have delayed gastric emptying in setting of DKA.  --Hematemesis occurred with first emesis making Mallory Weiss tear seem less likely. Hx of severe esophagitis which could be the source. Recurrent / persistent dudoenal ulcers seems less likely culprit.   --Amount of blood loss hard to determine, she describes as "a lot". Cannot rely on H/H in setting of volume depletion / IV fluids but her hgb is 18 --She will most likely require EGD after resolution of DKA.  --In the interim recommend BID IV PPI, Carafate. I asked her to keep [facility] elevated 30-45 degrees  # Recurrent DKA --Glucose is 502, K+ 3. C02 13. Anion gap 32. --PCCM managing.      HPI:                                                                                                                             Chief Complaint: blood in vomit  Jackee C Braunschweig is a 59 y.o. female with a pmh significant for, not necessarily limited to: IBS,GERD, Shatzki's ring, H.pylori infection ( treated 2018), duodenal ulcers , diverticulosis.   HTN, hyperlipidemia, DM, hypothyroidism, CKD. We last saw her in the office in 2018 but saw her inpatient a couple of times in 2020 for abdominal pain, N/V in setting of DKA. During admission in June 2020 she had findings of gastroenteritis on imaging. Enteroscopy revealing of esophagitis and duodenal ulcers.    Patient brought by EMS to ED yesterday for evaluation of chest pain / LUQ pain and vomiting. Admitted with severe DKA, PCCM following. Patient says she has chronic nausea and chronic abdominal pain but yesterday around 5:30pm began vomiting. Emesis  immediately contained bright red blood and she had 3 or so episodes. She has since developed burning in throat. She takes BID Protonix and rarely skips doses. She takes a daily baby asa, rarely any other NSAIDS. She hasn't been having any significant abdominal pain. Bowel movements at baseline, no blood in stool. In ED her WBC was 12, it has increased to 26.5 today. Hgb / Hct elevated at 17.9 / 52.3. Today hgb is up to 18.8. Glucose is 502, K+ 3. C02 only 13. Anion gap 32. Lipase 21, LFTs normal. CT scan w/ contrast - circumferential thickening of distal esophagus c/w esophagitis, marked distention of stomach without obstructing lesion.    Previous Endoscopic Evaluations / Pertinent Studies:   Feb 2018 EGD for nausea and dysphagia -Benign-appearing esophageal stenosis.  Dilated. - A single mucosal papule (nodule) found in the stomach. Biopsied. - The examination was otherwise normal. Biopsies = minimally active H.pylori.   March 2009 colonoscopy for N/V/D and pan-colitis on CT scan Benign-appearing esophageal stenosis. Dilated. - A single mucosal papule (nodule) found in the stomach. Biopsied. - The examination was otherwise normal. -random colon biopsies were negative.   June 2020 Screening colonoscopy  -LA Grade D esophagitis. Biopsied. - Normal stomach. Biopsied. - Non-bleeding duodenal ulcers with no stigmata of bleeding. Biopsied. - The examined portion of the jejunum was normal.  June 2020 enteroscopy -LA Grade D esophagitis. Biopsied. - Normal stomach. Biopsied. - Non-bleeding duodenal ulcers with no stigmata of bleeding. Biopsied. - The examined portion of the jejunum was normal. Duodenum, Biopsy, 2nd portion - DUODENAL MUCOSA WITH NO SIGNIFICANT PATHOLOGIC FINDINGS. - NEGATIVE FOR INCREASED INTRAEPITHELIAL LYMPHOCYTES AND VILLOUS ARCHITECTURAL CHANGES. 2. Stomach, biopsy, body - GASTRIC ANTRAL MUCOSA WITH MILD REACTIVE GASTROPATHY. - WARTHIN-STARRY STAIN IS NEGATIVE FOR  HELICOBACTER PYLORI. 3. Esophagus, biopsy - SQUAMOCOLUMNAR ESOPHAGEAL MUCOSA, ULCERATED AND INFLAMED WITH NECROINFLAMMATORY DEBRIS. - NEGATIVE FOR INTESTINAL METAPLASIA (GOBLET CELL METAPLASIA). Microscopic Comment  Past Medical History:  Diagnosis Date  . Allergy    seasonal  . Anxiety   . B12 deficiency   . Chest pain 04/05/2017  . CHF (congestive heart failure) ([place])   . Diabetes mellitus type 1, uncontrolled ([place])    "dr. just changed me to type 1, uncontrolled" (11/26/2018)  . DKA (diabetic ketoacidoses) ([place]) 05/25/2018  . GERD (gastroesophageal reflux disease)   . Hyperlipidemia   . Hypertension    [name]  . Hypothyroidism   . IBS (irritable bowel syndrome)   . Intermittent vertigo   . 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 Leg cramping    "@ night" (09/18/2013)  . Migraine    "once q couple months" (11/26/2018)  . Osteoporosis   . Peripheral neuropathy   . Umbilical hernia    unrepaired (09/18/2013)    Past Surgical History:  Procedure Laterality Date  . BIOPSY  06/03/2019   Procedure: BIOPSY;  Surgeon: Jackquline Denmark, MD;  Location: [facility];  Service: Endoscopy;;  . [place]  . ENTEROSCOPY N/A 06/03/2019   Procedure: ENTEROSCOPY;  Surgeon: Jackquline Denmark, MD;  Location: [facility];  Service: Endoscopy;  Laterality: N/A;  . LAPAROSCOPIC ASSISTED VAGINAL HYSTERECTOMY  09/23/2012   Procedure: LAPAROSCOPIC ASSISTED VAGINAL HYSTERECTOMY;  Surgeon: Linda Hedges, DO;  Location: [place] [facility];  Service: Gynecology;  Laterality: N/A;  pull Dr Gregor Hams instrument  . LEFT HEART CATH AND CORONARY ANGIOGRAPHY N/A 02/11/2017   Procedure: Left Heart Cath and Coronary Angiography;  Surgeon: Lorretta Harp, MD;  Location: [place] [facility];  Service: Cardiovascular;  Laterality: N/A;  . TUBAL LIGATION Bilateral 1992    Prior to Admission medications   Medication Sig Start Date End Date Taking? Authorizing Provider  cyclobenzaprine  (FLEXERIL) 10 MG tablet Take 1 tablet (10 mg total) by mouth at bedtime as needed for muscle spasms. 08/09/20  Yes Bedsole, Amy E, MD  HYDROcodone-acetaminophen (NORCO/VICODIN) 5-325 MG tablet Take 1 tablet by mouth every 8 (eight) hours as needed for up to 5 days for moderate pain. 08/11/20 08/16/20 Yes Bedsole, Amy E, MD  ibuprofen (ADVIL) 200 MG tablet Take 400 mg by mouth every 6 (six) hours as needed for fever, headache or mild pain.   Yes [provider]  insulin lispro (HUMALOG KWIKPEN) 100 UNIT/ML KiwkPen Inject into skin 3  times a day at meal time according to the following sliding scale: CBG 70-120 - 0 units / CBG 121-150 - 1 unit / CBG 151-200 - 2 units / CBG 201-250 - 3 units / CBG 251-300 - 5 units / CBG 301-350 - 7 units / CBG 351-400+ - 9 units  YOU MUST FOLLOW A STRICT DIABETIC DIET Patient taking differently: Inject into the skin See admin instructions. 8 units plus sliding scale Inject into skin 3 times a day at meal time according to the following sliding scale: CBG 70-120 - 0 units / CBG 121-150 - 1 unit / CBG 151-200 - 2 units / CBG 201-250 - 3 units / CBG 251-300 - 5 units / CBG 301-350 - 7 units / CBG 351-400+ - 9 units  YOU MUST FOLLOW A STRICT DIABETIC DIET 07/01/18  Yes Bedsole, Amy E, MD  levothyroxine (SYNTHROID) 175 MCG tablet Take 175 mcg by mouth daily. 06/03/20  Yes [provider]  promethazine (PHENERGAN) 25 MG tablet Take 1 TABLET BY MOUTH EVERY 8 HOUR AS NEEDED FOR NAUSEA AND VOMITINB. Patient taking differently: Take 25 mg by mouth every 8 (eight) hours as needed for nausea or vomiting. Take 1 TABLET BY MOUTH EVERY 8 HOUR AS NEEDED FOR NAUSEA AND VOMITINB. 08/09/20  Yes Bedsole, Amy E, MD  TOUJEO SOLOSTAR 300 UNIT/ML Solostar Pen Inject 24 Units into the skin at bedtime.  07/18/20  Yes [provider]  Continuous Blood Gluc Sensor (FREESTYLE LIBRE 14 DAY SENSOR) MISC by Does not apply route. 04/01/19   [provider]    fexofenadine (ALLEGRA) 180 MG tablet Take 1 tablet (180 mg total) by mouth daily. Patient not taking: Reported on 08/16/2020 02/11/20   Jinny Sanders, MD  Insulin Pen Needle (PEN NEEDLES) 31G X 8 MM MISC Use to inject insulin 4 times a day.  Dx: E11.10 12/23/17   Jinny Sanders, MD  Lancets (ONETOUCH ULTRASOFT) lancets Use to check blood sugar daily as needed.  Dx: E11.10 12/23/17   Jinny Sanders, MD  metoCLOPramide (REGLAN) 5 MG tablet Take 5mg  before breakfast lunch and dinner and 10mg  at bedtime Patient not taking: Reported on 08/16/2020 04/29/19   Milus Banister, MD  metoprolol succinate (TOPROL-XL) 25 MG 24 hr tablet Take 1 tablet (25 mg total) by mouth daily. Patient not taking: Reported on 08/16/2020 04/08/20   Minus Breeding, MD  [facility] VERIO test strip USE TO CHECK BLOOD SUGAR DAILY AS NEEDED. DX: E11.10 Patient not taking: Reported on 08/09/2020 09/09/19   Jinny Sanders, MD  pantoprazole (PROTONIX) 40 MG tablet Take 1 tablet (40 mg total) by mouth 2 (two) times daily before a meal. Patient not taking: Reported on 08/16/2020 06/03/19   Florencia Reasons, MD  predniSONE (DELTASONE) 10 MG tablet 3 tabs by mouth daily x 3 days, then 2 tabs by mouth daily x 2 days then 1 tab by mouth daily x 2 days 08/09/20   Diona Browner, Amy E, MD  rosuvastatin (CRESTOR) 40 MG tablet TAKE 1 TABLET BY MOUTH EVERY DAY Patient not taking: Reported on 08/16/2020 04/12/20   Minus Breeding, MD  sertraline (ZOLOFT) 100 MG tablet TAKE 1 TABLET BY MOUTH EVERY DAY Patient not taking: Reported on 08/16/2020 11/26/19   Jinny Sanders, MD  TRESIBA FLEXTOUCH 100 UNIT/ML SOPN FlexTouch Pen Inject 0.24 mLs (24 Units total) into the skin at bedtime. On 10/20/18 take at lunch time, on 10/21/18 take at dinner time. On 10/22/18 resume at bedtime regimen. Patient  not taking: Reported on 08/16/2020 10/20/18   Lavina Hamman, MD    Current Facility-Administered Medications  Medication Dose Route Frequency Provider Last Rate Last Admin  . dextrose 5 % in  lactated ringers infusion   Intravenous Continuous Palumbo, April, MD   Held at 08/16/20 [id number]  . dextrose 50 % solution 0-50 mL  0-50 mL Intravenous PRN Palumbo, April, MD      . insulin regular, human (MYXREDLIN) 100 units/ 100 mL infusion   Intravenous Continuous Palumbo, April, MD 5.5 mL/hr at 08/16/20 0911 5.5 Units/hr at 08/16/20 0911  . lactated ringers infusion   Intravenous Continuous Palumbo, April, MD 125 mL/hr at 08/16/20 0907 New Bag at 08/16/20 3557  . pantoprazole (PROTONIX) 80 mg in sodium chloride 0.9 % 100 mL (0.8 mg/mL) infusion  8 mg/hr Intravenous Continuous Palumbo, April, MD      . pantoprazole (PROTONIX) 80 mg in sodium chloride 0.9 % 100 mL IVPB  80 mg Intravenous Once Palumbo, April, MD      . Derrill Memo ON 08/19/2020] pantoprazole (PROTONIX) injection 40 mg  40 mg Intravenous Q12H Palumbo, April, MD       Current Outpatient Medications  Medication Sig Dispense Refill  . cyclobenzaprine (FLEXERIL) 10 MG tablet Take 1 tablet (10 mg total) by mouth at bedtime as needed for muscle spasms. 15 tablet 0  . HYDROcodone-acetaminophen (NORCO/VICODIN) 5-325 MG tablet Take 1 tablet by mouth every 8 (eight) hours as needed for up to 5 days for moderate pain. 15 tablet 0  . ibuprofen (ADVIL) 200 MG tablet Take 400 mg by mouth every 6 (six) hours as needed for fever, headache or mild pain.    Marland Kitchen insulin lispro (HUMALOG KWIKPEN) 100 UNIT/ML KiwkPen Inject into skin 3 times a day at meal time according to the following sliding scale: CBG 70-120 - 0 units / CBG 121-150 - 1 unit / CBG 151-200 - 2 units / CBG 201-250 - 3 units / CBG 251-300 - 5 units / CBG 301-350 - 7 units / CBG 351-400+ - 9 units  YOU MUST FOLLOW A STRICT DIABETIC DIET (Patient taking differently: Inject into the skin See admin instructions. 8 units plus sliding scale Inject into skin 3 times a day at meal time according to the following sliding scale: CBG 70-120 - 0 units / CBG 121-150 - 1 unit / CBG 151-200 - 2 units  / CBG 201-250 - 3 units / CBG 251-300 - 5 units / CBG 301-350 - 7 units / CBG 351-400+ - 9 units  YOU MUST FOLLOW A STRICT DIABETIC DIET) 4 pen 11  . levothyroxine (SYNTHROID) 175 MCG tablet Take 175 mcg by mouth daily.    . promethazine (PHENERGAN) 25 MG tablet Take 1 TABLET BY MOUTH EVERY 8 HOUR AS NEEDED FOR NAUSEA AND VOMITINB. (Patient taking differently: Take 25 mg by mouth every 8 (eight) hours as needed for nausea or vomiting. Take 1 TABLET BY MOUTH EVERY 8 HOUR AS NEEDED FOR NAUSEA AND VOMITINB.) 30 tablet 1  . TOUJEO SOLOSTAR 300 UNIT/ML Solostar Pen Inject 24 Units into the skin at bedtime.     . Continuous Blood Gluc Sensor (FREESTYLE LIBRE 14 DAY SENSOR) MISC by Does not apply route.    . fexofenadine (ALLEGRA) 180 MG tablet Take 1 tablet (180 mg total) by mouth daily. (Patient not taking: Reported on 08/16/2020) 90 tablet 3  . Insulin Pen Needle (PEN NEEDLES) 31G X 8 MM MISC Use to inject insulin 4 times a day.  Dx: E11.10 130 each 11  . Lancets (ONETOUCH ULTRASOFT) lancets Use to check blood sugar daily as needed.  Dx: E11.10 100 each 3  . metoCLOPramide (REGLAN) 5 MG tablet Take 5mg  before breakfast lunch and dinner and 10mg  at bedtime (Patient not taking: Reported on 08/16/2020) 150 tablet 11  . metoprolol succinate (TOPROL-XL) 25 MG 24 hr tablet Take 1 tablet (25 mg total) by mouth daily. (Patient not taking: Reported on 08/16/2020) 90 tablet 2  . ONETOUCH VERIO test strip USE TO CHECK BLOOD SUGAR DAILY AS NEEDED. DX: E11.10 (Patient not taking: Reported on 08/09/2020) 100 strip 3  . pantoprazole (PROTONIX) 40 MG tablet Take 1 tablet (40 mg total) by mouth 2 (two) times daily before a meal. (Patient not taking: Reported on 08/16/2020) 30 tablet 11  . predniSONE (DELTASONE) 10 MG tablet 3 tabs by mouth daily x 3 days, then 2 tabs by mouth daily x 2 days then 1 tab by mouth daily x 2 days 15 tablet 0  . rosuvastatin (CRESTOR) 40 MG tablet TAKE 1 TABLET BY MOUTH EVERY DAY (Patient not  taking: Reported on 08/16/2020) 90 tablet 3  . sertraline (ZOLOFT) 100 MG tablet TAKE 1 TABLET BY MOUTH EVERY DAY (Patient not taking: Reported on 08/16/2020) 90 tablet 1  . TRESIBA FLEXTOUCH 100 UNIT/ML SOPN FlexTouch Pen Inject 0.24 mLs (24 Units total) into the skin at bedtime. On 10/20/18 take at lunch time, on 10/21/18 take at dinner time. On 10/22/18 resume at bedtime regimen. (Patient not taking: Reported on 08/16/2020)  5    Allergies as of 08/16/2020 - Review Complete 08/16/2020  Allergen Reaction Noted  . Codeine Hives, Anxiety, and Other (See Comments)     Family History  Problem Relation Age of Onset  . Diabetes Other   . Heart disease Other   . Heart failure Mother 21       Her heart skipped a beat and stopped - per pt  . Diabetes Maternal Grandmother   . Alzheimer's disease Maternal Grandmother   . Coronary artery disease Maternal Grandmother   . Heart attack Maternal Grandfather   . Heart failure Brother   . Diabetes Brother   . Colon polyps Neg Hx   . Esophageal cancer Neg Hx   . Liver cancer Neg Hx   . Pancreatic cancer Neg Hx   . Stomach cancer Neg Hx   . Crohn's disease Neg Hx   . Rectal cancer Neg Hx     Social History   Socioeconomic History  . Marital status: Married    Spouse name: Not on file  . Number of children: 2  . Years of education: Not on file  . Highest education level: Not on file  Occupational History  . Occupation: UNEMPLOYED    Fish farm manager: UNEMPLOYED    Comment: homemaker  Tobacco Use  . Smoking status: Former Smoker    Packs/day: 0.12    Years: 10.00    Pack years: 1.20    Types: Cigarettes    Quit date: 12/10/1990    Years since quitting: 29.7  . Smokeless tobacco: Never Used  Vaping Use  . Vaping Use: Never used  Substance and Sexual Activity  . Alcohol use: Never    Alcohol/week: 0.0 standard drinks  . Drug use: Yes    Types: Marijuana    Comment: last use 2 days ago  . Sexual activity: Not Currently  Other Topics Concern  .  Not on file  Social History Narrative   Regular  exercise- no    Diet- lots of mountain dew, limits fast foods    Social Determinants of Health   Financial Resource Strain:   . Difficulty of Paying Living Expenses: Not on file  Food Insecurity:   . Worried About Charity fundraiser in the Last Year: Not on file  . Ran Out of Food in the Last Year: Not on file  Transportation Needs:   . Lack of Transportation (Medical): Not on file  . Lack of Transportation (Non-Medical): Not on file  Physical Activity:   . Days of Exercise per Week: Not on file  . Minutes of Exercise per Session: Not on file  Stress:   . Feeling of Stress : Not on file  Social Connections:   . Frequency of Communication with Friends and Family: Not on file  . Frequency of Social Gatherings with Friends and Family: Not on file  . Attends Religious Services: Not on file  . Active Member of Clubs or Organizations: Not on file  . Attends Archivist Meetings: Not on file  . Marital Status: Not on file  Intimate Partner Violence:   . Fear of Current or Ex-Partner: Not on file  . Emotionally Abused: Not on file  . Physically Abused: Not on file  . Sexually Abused: Not on file    Review of Systems: All systems reviewed and negative except where noted in HPI.  PREVIOUS ENDOSCOPIC STUDIES / IMAGING:      OBJECTIVE:    Physical Exam: Vital signs in last 24 hours: Temp:  [97.7 F (36.5 C)-98.4 F (36.9 C)] 97.7 F (36.5 C) (09/07 0822) Pulse Rate:  [67-138] 67 (09/07 0822) Resp:  [12-20] 12 (09/07 0822) BP: (90-145)/(77-99) 145/98 (09/07 0822) SpO2:  [98 %-99 %] 98 % (09/07 9798)   General:   Alert, well-developed,  female in NAD Psych:  Pleasant, cooperative. Normal mood and affect. Eyes:  Pupils equal, sclera clear, no icterus.   Conjunctiva pink. Ears:  Normal auditory acuity. Nose:  No deformity, discharge,  or lesions. Neck:  Supple; no masses Lungs:  Clear throughout to auscultation.    No wheezes, crackles, or rhonchi.  Heart:  Sinus tachycardia, no lower extremity edema Abdomen:  Soft, mildly distended with a few BS,  no palp mass   Rectal:  Deferred  Msk:  Symmetrical without gross deformities. . Neurologic:  Alert and  oriented x4;  grossly normal neurologically. Skin:  Intact without significant lesions or rashes.  There were no vitals filed for this visit.   Scheduled inpatient medications . [START ON 08/19/2020] pantoprazole  40 mg Intravenous Q12H      Intake/Output from previous day: No intake/output data recorded. Intake/Output this shift: No intake/output data recorded.   Lab Results: Recent Labs    08/15/20 1404 08/16/20 0238  WBC 12.0* 26.5*  HGB 17.9* 18.8*  HCT 52.3* 55.8*  PLT 399 439*   BMET Recent Labs    08/15/20 1404 08/16/20 0238  NA 133* 135  K 4.0 3.0*  CL 94* 90*  CO2 22 13*  GLUCOSE 312* 502*  BUN 25* 30*  CREATININE 0.65 0.86  CALCIUM 9.6 9.8   LFT Recent Labs    08/16/20 0238  PROT 8.2*  ALBUMIN 4.8  AST 16  ALT 12  ALKPHOS 55  BILITOT 1.4*   PT/INR Recent Labs    08/16/20 0550  LABPROT 14.8  INR 1.2   Hepatitis Panel No results for input(s): HEPBSAG, HCVAB, HEPAIGM, HEPBIGM in the  last 72 hours.   . CBC Latest Ref Rng & Units 08/16/2020 08/15/2020 07/11/2020  WBC 4.0 - 10.5 K/uL 26.5(H) 12.0(H) 10.5  Hemoglobin 12.0 - 15.0 g/dL 18.8(H) 17.9(H) 15.7  Hematocrit 36 - 46 % 55.8(H) 52.3(H) 48.3(H)  Platelets 150 - 400 K/uL 439(H) 399 318    . CMP Latest Ref Rng & Units 08/16/2020 08/15/2020 07/11/2020  Glucose 70 - 99 mg/dL 502(HH) 312(H) 373(H)  BUN 6 - 20 mg/dL 30(H) 25(H) 11  Creatinine 0.44 - 1.00 mg/dL 0.86 0.65 0.74  Sodium 135 - 145 mmol/L 135 133(L) 132(L)  Potassium 3.5 - 5.1 mmol/L 3.0(L) 4.0 4.6  Chloride 98 - 111 mmol/L 90(L) 94(L) 93(L)  CO2 22 - 32 mmol/L 13(L) 22 24  Calcium 8.9 - 10.3 mg/dL 9.8 9.6 9.5  Total Protein 6.5 - 8.1 g/dL 8.2(H) - -  Total Bilirubin 0.3 - 1.2 mg/dL 1.4(H) - -   Alkaline Phos 38 - 126 U/L 55 - -  AST 15 - 41 U/L 16 - -  ALT 0 - 44 U/L 12 - -   Studies/Results: DG Chest 2 View  Result Date: 08/15/2020 CLINICAL DATA:  Chest pain. EXAM: CHEST - 2 VIEW COMPARISON:  May 30, 2019. FINDINGS: The heart size and mediastinal contours are within normal limits. Both lungs are clear. No pneumothorax or pleural effusion is noted. The visualized skeletal structures are unremarkable. IMPRESSION: No active cardiopulmonary disease. Electronically Signed   By: Marijo Conception M.D.   On: 08/15/2020 14:41   CT ABDOMEN PELVIS W CONTRAST  Result Date: 08/16/2020 CLINICAL DATA:  Abdominal pain. Patient complains of left upper quadrant pain. EXAM: CT ABDOMEN AND PELVIS WITH CONTRAST TECHNIQUE: Multidetector CT imaging of the abdomen and pelvis was performed using the standard protocol following bolus administration of intravenous contrast. CONTRAST:  135mL OMNIPAQUE IOHEXOL 300 MG/ML  SOLN COMPARISON:  CT of the abdomen and pelvis 05/31/2019. FINDINGS: Lower chest: The lung bases are clear without focal nodule, mass, or airspace disease. Heart size is normal. No significant pleural or pericardial effusion is present. Hepatobiliary: No focal liver abnormality is seen. No gallstones, gallbladder wall thickening, or biliary dilatation. 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 Pancreas: Unremarkable. No pancreatic ductal dilatation or surrounding inflammatory changes. Spleen: Normal in size without focal abnormality. Adrenals/Urinary Tract: Adrenal glands are unremarkable. Kidneys are normal, without renal calculi, focal lesion, or hydronephrosis. Bladder is moderately distended. No obstructing lesion is present. Stomach/Bowel: Circumferential thickening is present in the distal esophagus. The stomach is markedly distended. No obstructing lesion is present. Proximal duodenum is unremarkable. Similar appearance was present previously. The small bowel is  within normal limits. Terminal ileum is normal. The appendix is visualized and normal. The ascending and transverse colon are within normal limits. Descending and sigmoid colon are mostly collapsed, within normal limits. Vascular/Lymphatic: No significant vascular findings are present. No enlarged abdominal or pelvic lymph nodes. Reproductive: Status post hysterectomy. No adnexal masses. Other: No abdominal wall hernia or abnormality. No abdominopelvic ascites. Musculoskeletal: No acute or significant osseous findings. IMPRESSION: 1. Circumferential thickening in the distal esophagus compatible with esophagitis. 2. Marked distention of the stomach without an obstructing lesion. Question gastroparesis. Given similar appearance of gastric distension with previous duodenal distension, SMA syndrome is also considered. 3. Status post hysterectomy. Electronically Signed   By: San Morelle M.D.   On: 08/16/2020 05:30   DG Abd Acute W/Chest  Result Date: 08/16/2020 CLINICAL DATA:  Mid abdominal pain and nausea for  48 hours EXAM: DG ABDOMEN ACUTE W/ 1V CHEST COMPARISON:  Abdomen 06/01/2019 FINDINGS: Normal heart size and pulmonary vascularity. Emphysematous changes in the lungs. No airspace disease or consolidation. No pleural effusions. No pneumothorax. Mediastinal contours appear intact. Scattered gas and stool throughout the colon with some gas-filled small bowel. No small or large bowel distention. No free intra-abdominal air. No abnormal air-fluid levels. No radiopaque stones. Visualized bones and soft tissue contours appear intact. IMPRESSION: Emphysematous changes in the lungs. No evidence of active pulmonary disease. Nonobstructive bowel gas pattern. Electronically Signed   By: Lucienne Capers M.D.   On: 08/16/2020 04:23    Active Problems:   * No active hospital problems. Tye Savoy, NP-C @  08/16/2020, 9:46 AM

## 2020-08-16 NOTE — Consult Note (Signed)
NAME:  [name] [name], MRN:  [id number], DOB:  12-22-1960, LOS: 0 ADMISSION DATE:  08/16/2020, CONSULTATION DATE:  08/17/2019 REFERRING MD:  April Palumbo MD, CHIEF COMPLAINT:  DKA, hematemesis  Brief History   59 year old with history of diabetes, DKA, GERD Admitted with abdominal pain, vomiting, hematemesis.  Found to be in severe DKA PCCM consulted for help with management.  Patient states that she has been in DKA several times in the past.  She has been taking her insulin regularly but has been under stress lately at home Denies any fevers, chills, cough, sputum production or any other signs of infection.  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  Significant Hospital Events   9/7 Admit  Consults:    Procedures:    Significant Diagnostic Tests:  CT abdomen pelvis 08/16/2020-esophagitis, marked distention of stomach.  Visualized lung bases are clear. I have reviewed the images personally.  Micro Data:    Antimicrobials:    Interim history/subjective:    Objective   Blood pressure (!) 145/98, pulse 67, temperature 97.7 F (36.5 C), temperature source Oral, resp. rate 12, last menstrual period 08/11/2012, SpO2 98 %.       No intake or output data in the 24 hours ending 08/16/20 0922 There were no vitals filed for this visit.  Examination: Blood pressure (!) 145/98, pulse 67, temperature 97.7 F (36.5 C), temperature source Oral, resp. rate 12, last menstrual period 08/11/2012, SpO2 98  %. Gen:      No acute distress HEENT:  EOMI, sclera anicteric Neck:     No masses; no thyromegaly Lungs:    Clear to auscultation bilaterally; normal respiratory effort CV:         Regular rate and rhythm; no murmurs Abd:      + bowel sounds; soft, non-tender; no palpable masses, no distension Ext:    No edema; adequate peripheral perfusion Skin:      Warm and dry; no rash Neuro: alert and oriented x 3 Psych: normal mood and affect  Resolved Hospital Problem list     Assessment & Plan:  Severe DKA Continue IV fluids, insulin drip, keep n.p.o.  Hematemesis, gastroparesis No evidence of esophagitis on CT scan PPI, will need GI consult Monitor CBC  Leukocytosis Likely secondary to stress, DKA Can observe off antibiotics   Does not need ICU with hemodynamic stability and pH of 7.33 Will likely be okay for progressive care unit Okay for admission by [facility] hospitalist PCCM will be available as needed.  Please call with any questions.   Best practice:  Diet: NPO Pain/Anxiety/Delirium protocol (if indicated): NA VAP protocol (if indicated): NA DVT prophylaxis: SCDs GI prophylaxis: PPI Glucose control: Insulin drip Mobility: Bed Code Status: Full Family Communication: Patient updated Disposition: Full  Labs   CBC: Recent Labs  Lab 08/15/20 1404 08/16/20 0238  WBC 12.0* 26.5*  HGB 17.9* 18.8*  HCT 52.3* 55.8*  MCV 87.6 88.3  PLT 399 439*    Basic Metabolic Panel: Recent Labs  Lab 08/15/20 1404 08/16/20 0238  NA 133* 135  K 4.0 3.0*  CL 94* 90*  CO2 22 13*  GLUCOSE 312* 502*  BUN 25* 30*  CREATININE 0.65 0.86  CALCIUM 9.6 9.8   GFR: Estimated Creatinine Clearance: 51.2 mL/min (by C-G formula based on SCr of 0.86 mg/dL). Recent Labs  Lab 08/15/20 1404 08/16/20 0238  WBC 12.0* 26.5*    Liver Function Tests: Recent Labs  Lab 08/16/20 0238  AST 16  ALT 12  ALKPHOS 55  BILITOT 1.4*  PROT 8.2*  ALBUMIN 4.8   Recent Labs  Lab 08/16/20 0238   LIPASE 21   No results for input(s): AMMONIA in the last 168 hours.  ABG    Component Value Date/Time   PHART 7.370 05/26/2018 0030   PCO2ART 22.6 (L) 05/26/2018 0030   PO2ART 100.0 05/26/2018 0030   HCO3 14.4 (L) 08/16/2020 0542   TCO2 23 05/30/2019 0324   ACIDBASEDEF 9.7 (H) 08/16/2020 0542   O2SAT 86.7 08/16/2020 0542     Coagulation Profile: Recent Labs  Lab 08/16/20 0550  INR 1.2    Cardiac Enzymes: No results for input(s): CKTOTAL, CKMB, CKMBINDEX, TROPONINI in the last 168 hours.  HbA1C: Hemoglobin A1C  Date/Time Value Ref Range Status  01/14/2020 04:11 PM 15.0 (A) 4.0 - 5.6 % Final   Hgb A1c MFr Bld  Date/Time Value Ref Range Status  03/25/2019 07:42 PM 11.6 (H) 4.8 - 5.6 % Final    Comment:    (NOTE) Pre diabetes:          5.7%-6.4% Diabetes:              >6.4% Glycemic control for   <7.0% adults with diabetes   01/12/2019 07:13 PM 13.2 (H) 4.8 - 5.6 % Final    Comment:    (NOTE) Pre diabetes:          5.7%-6.4% Diabetes:              >6.4% Glycemic control for   <7.0% adults with diabetes     CBG: Recent Labs  Lab 08/16/20 0528 08/16/20 0748 08/16/20 0910  GLUCAP >600* 393* 279*    Review of Systems:   REVIEW OF SYSTEMS:   All negative; except for those that are bolded, which indicate positives.  Constitutional: weight loss, weight gain, night sweats, fevers, chills, fatigue, weakness.  HEENT: headaches, sore throat, sneezing, nasal congestion, post nasal drip, difficulty swallowing, tooth/dental problems, visual complaints, visual changes, ear aches. Neuro: difficulty with speech, weakness, numbness, ataxia. CV:  chest pain, orthopnea, PND, swelling in lower extremities, dizziness, palpitations, syncope.  Resp: cough, hemoptysis, dyspnea, wheezing. GI: heartburn, indigestion, abdominal pain, nausea, vomiting, diarrhea, constipation, change in bowel habits, loss of appetite, hematemesis, melena, hematochezia.  GU: dysuria, change in  color of urine, urgency or frequency, flank pain, hematuria. MSK: joint pain or swelling, decreased range of motion. Psych: change in mood or affect, depression, anxiety, suicidal ideations, homicidal ideations. Skin: rash, itching, bruising.  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   Surgical History    Past Surgical History:  Procedure Laterality Date  . BIOPSY  06/03/2019   Procedure: BIOPSY;  Surgeon: Jackquline Denmark, MD;  Location: [facility];  Service: Endoscopy;;  . [place]  . ENTEROSCOPY N/A 06/03/2019  Procedure: ENTEROSCOPY;  Surgeon: Jackquline Denmark, MD;  Location: [facility];  Service: Endoscopy;  Laterality: N/A;  . LAPAROSCOPIC ASSISTED VAGINAL HYSTERECTOMY  09/23/2012   Procedure: LAPAROSCOPIC ASSISTED VAGINAL HYSTERECTOMY;  Surgeon: Linda Hedges, DO;  Location: [facility];  Service: Gynecology;  Laterality: N/A;  pull Dr Gregor Hams instrument  . LEFT HEART CATH AND CORONARY ANGIOGRAPHY N/A 02/11/2017   Procedure: Left Heart Cath and Coronary Angiography;  Surgeon: Lorretta Harp, MD;  Location: [facility];  Service: Cardiovascular;  Laterality: N/A;  . TUBAL LIGATION Bilateral 1992     Social History   reports that she quit smoking about 29 years ago. Her smoking use included cigarettes. She has a 1.20 pack-year smoking history. She has never used smokeless tobacco. She reports current drug use. Drug: Marijuana. She reports that she does not drink alcohol.   Family  History   Her family history includes Alzheimer's disease in her maternal grandmother; Coronary artery disease in her maternal grandmother; Diabetes in her brother, maternal grandmother, and another family member; Heart attack in her maternal grandfather; Heart disease in an other family member; Heart failure in her brother; Heart failure (age of onset: 35) in her mother. There is no history of Colon polyps, Esophageal cancer, Liver cancer, Pancreatic cancer, Stomach cancer, Crohn's disease, or Rectal cancer.   Allergies Allergies  Allergen Reactions  . Codeine Hives, Anxiety and Other (See Comments)     Home Medications  Prior to Admission medications   Medication Sig Start Date End Date Taking? Authorizing Provider  cyclobenzaprine (FLEXERIL) 10 MG tablet Take 1 tablet (10 mg total) by mouth at bedtime as needed for muscle spasms. 08/09/20  Yes Bedsole, Amy E, MD  HYDROcodone-acetaminophen (NORCO/VICODIN) 5-325 MG tablet Take 1 tablet by mouth every 8 (eight) hours as needed for up to 5 days for moderate pain. 08/11/20 08/16/20 Yes Bedsole, Amy E, MD  ibuprofen (ADVIL) 200 MG tablet Take 400 mg by mouth every 6 (six) hours as needed for fever, headache or mild pain.   Yes [provider]  insulin lispro (HUMALOG KWIKPEN) 100 UNIT/ML KiwkPen Inject into skin 3 times a day at meal time according to the following sliding scale: CBG 70-120 - 0 units / CBG 121-150 - 1 unit / CBG 151-200 - 2 units / CBG 201-250 - 3 units / CBG 251-300 - 5 units / CBG 301-350 - 7 units / CBG 351-400+ - 9 units  YOU MUST FOLLOW A STRICT DIABETIC DIET Patient taking differently: Inject into the skin See admin instructions. 8 units plus sliding scale Inject into skin 3 times a day at meal time according to the following sliding scale: CBG 70-120 - 0 units / CBG 121-150 - 1 unit / CBG 151-200 - 2 units / CBG 201-250 - 3 units / CBG 251-300 - 5 units / CBG 301-350 - 7 units / CBG 351-400+ - 9 units  YOU  MUST FOLLOW A STRICT DIABETIC DIET 07/01/18  Yes Bedsole, Amy E, MD  levothyroxine (SYNTHROID) 175 MCG tablet Take 175 mcg by mouth daily. 06/03/20  Yes [provider]  promethazine (PHENERGAN) 25 MG tablet Take 1 TABLET BY MOUTH EVERY 8 HOUR AS NEEDED FOR NAUSEA AND VOMITINB. Patient taking differently: Take 25 mg by mouth every 8 (eight) hours as needed for nausea or vomiting. Take 1 TABLET BY MOUTH EVERY 8 HOUR AS NEEDED FOR NAUSEA AND VOMITINB. 08/09/20  Yes Bedsole, Amy E, MD  TOUJEO SOLOSTAR 300 UNIT/ML Solostar Pen Inject 24  Units into the skin at bedtime.  07/18/20  Yes [provider]  Continuous Blood Gluc Sensor (FREESTYLE LIBRE 14 DAY SENSOR) MISC by Does not apply route. 04/01/19   [provider]  fexofenadine (ALLEGRA) 180 MG tablet Take 1 tablet (180 mg total) by mouth daily. Patient not taking: Reported on 08/16/2020 02/11/20   Jinny Sanders, MD  Insulin Pen Needle (PEN NEEDLES) 31G X 8 MM MISC Use to inject insulin 4 times a day.  Dx: E11.10 12/23/17   Jinny Sanders, MD  Lancets (ONETOUCH ULTRASOFT) lancets Use to check blood sugar daily as needed.  Dx: E11.10 12/23/17   Jinny Sanders, MD  metoCLOPramide (REGLAN) 5 MG tablet Take 5mg  before breakfast lunch and dinner and 10mg  at bedtime Patient not taking: Reported on 08/16/2020 04/29/19   Milus Banister, MD  metoprolol succinate (TOPROL-XL) 25 MG 24 hr tablet Take 1 tablet (25 mg total) by mouth daily. Patient not taking: Reported on 08/16/2020 04/08/20   Minus Breeding, MD  [facility] VERIO test strip USE TO CHECK BLOOD SUGAR DAILY AS NEEDED. DX: E11.10 Patient not taking: Reported on 08/09/2020 09/09/19   Jinny Sanders, MD  pantoprazole (PROTONIX) 40 MG tablet Take 1 tablet (40 mg total) by mouth 2 (two) times daily before a meal. Patient not taking: Reported on 08/16/2020 06/03/19   Florencia Reasons, MD  predniSONE (DELTASONE) 10 MG tablet 3 tabs by mouth daily x 3 days, then 2 tabs by mouth daily x 2 days then 1 tab by  mouth daily x 2 days 08/09/20   Diona Browner, Amy E, MD  rosuvastatin (CRESTOR) 40 MG tablet TAKE 1 TABLET BY MOUTH EVERY DAY Patient not taking: Reported on 08/16/2020 04/12/20   Minus Breeding, MD  sertraline (ZOLOFT) 100 MG tablet TAKE 1 TABLET BY MOUTH EVERY DAY Patient not taking: Reported on 08/16/2020 11/26/19   Jinny Sanders, MD  TRESIBA FLEXTOUCH 100 UNIT/ML SOPN FlexTouch Pen Inject 0.24 mLs (24 Units total) into the skin at bedtime. On 10/20/18 take at lunch time, on 10/21/18 take at dinner time. On 10/22/18 resume at bedtime regimen. Patient not taking: Reported on 08/16/2020 10/20/18   Lavina Hamman, MD     Critical care time: NA   Marshell Garfinkel MD [facility] Please see [email or web address] for pager details.  08/16/2020, 9:44 AM

## 2020-08-16 NOTE — H&P (Signed)
History and Physical        Hospital Admission Note Date: 08/16/2020  Patient name: [name] [name] Medical record number: [id number] Date of birth: [birth year] Age: 59 y.o. Gender: female  PCP: Jinny Sanders, MD  Patient coming from: home   Chief Complaint    Chief Complaint  Patient presents with  . Abdominal Pain      HPI:   This is a 59 year old female with past medical history of poorly controlled type 2 diabetes complicated by DKA, duodenal ulcer, GERD, A. fib, HFrEF who was admitted with abdominal pain, vomiting and some hematemesis and found to be in severe DKA. Initially placed on IV fluids and insulin drip, n.p.o. and PCCM was consulted for help with management by the ED physician.  GI was also consulted for the hematemesis/gastroparesis.  Of note, patient was recently seen by her PCP on 8/31 for acute right shoulder pain thought to be cervical radiculopathy and was started on a prednisone taper x1 week, muscle relaxants and cervical collar.  Her PCP recognize the very high risk for DKA and empirically increased her long-acting insulin to 28 units daily.  ED Course: Vitals: T 90 55F, HR 138, RR 20, BP 129/99, 99% on room air.  Notable labs from 9/6-> 9/7: sodium 133, K4.0 -> 3.0, glucose 312-> 502, bicarb 22 -> 13, BUN 25 -> 30, creatinine 0.65.  AG 17-> 32, ketones 7.6, WBC 12.0 -> 26.5, Hb 17.9 -> 18.8 platelets 399-> 439 UA with ketones and glucose.  Initial plan was for admit to PCCM team however patient had improvement with fluids and insulin drip and patient was admitted to the hospitalist team. GI was consulted by the ED MD.  Vitals:   08/16/20 0822 08/16/20 1038  BP: (!) 145/98 (!) 145/98  Pulse: 67 (!) 119  Resp: 12 (!) 21  Temp: 97.7 F (36.5 C) 97.7 F (36.5 C)  SpO2: 98% 96%     Review of Systems:  Review of Systems  Constitutional: Negative for chills  and fever.  Eyes: Negative for blurred vision.  Respiratory: Negative for cough and shortness of breath.   Cardiovascular: Negative for chest pain and palpitations.  Gastrointestinal: Positive for nausea and vomiting.  Genitourinary: Negative for dysuria.  Musculoskeletal: Negative for neck pain.  Neurological: Negative for tingling and focal weakness.  All other systems reviewed and are negative.   Medical/Social/Family History   Past Medical History: Past Medical History:  Diagnosis Date  . Allergy    seasonal  . Anxiety   . B12 deficiency   . Chest pain 04/05/2017  . CHF (congestive heart failure) ([place])   . Diabetes mellitus type 1, uncontrolled ([place])    "dr. just changed me to type 1, uncontrolled" (11/26/2018)  . DKA (diabetic ketoacidoses) ([place]) 05/25/2018  . GERD (gastroesophageal reflux disease)   . Hyperlipidemia   . Hypertension    [name]  . Hypothyroidism   . IBS (irritable bowel syndrome)   . Intermittent vertigo   . 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 Leg cramping    "@ night" (09/18/2013)  . Migraine    "once q couple months" (11/26/2018)  . Osteoporosis   . Peripheral  neuropathy   . Umbilical hernia    unrepaired (09/18/2013)    Past Surgical History:  Procedure Laterality Date  . BIOPSY  06/03/2019   Procedure: BIOPSY;  Surgeon: Jackquline Denmark, MD;  Location: [facility];  Service: Endoscopy;;  . [place]  . ENTEROSCOPY N/A 06/03/2019   Procedure: ENTEROSCOPY;  Surgeon: Jackquline Denmark, MD;  Location: [facility];  Service: Endoscopy;  Laterality: N/A;  . LAPAROSCOPIC ASSISTED VAGINAL HYSTERECTOMY  09/23/2012   Procedure: LAPAROSCOPIC ASSISTED VAGINAL HYSTERECTOMY;  Surgeon: Linda Hedges, DO;  Location: [facility];  Service: Gynecology;  Laterality: N/A;  pull Dr Gregor Hams instrument  . LEFT HEART CATH AND CORONARY ANGIOGRAPHY N/A 02/11/2017   Procedure: Left Heart Cath and Coronary Angiography;  Surgeon: Lorretta Harp, MD;  Location: [facility];  Service: Cardiovascular;  Laterality: N/A;  . TUBAL LIGATION Bilateral 1992    Medications: Prior to Admission medications   Medication Sig Start Date End Date Taking? Authorizing Provider  cyclobenzaprine (FLEXERIL) 10 MG tablet Take 1 tablet (10 mg total) by mouth at bedtime as needed for muscle spasms. 08/09/20  Yes Bedsole, Amy E, MD  HYDROcodone-acetaminophen (NORCO/VICODIN) 5-325 MG tablet Take 1 tablet by mouth every 8 (eight) hours as needed for up to 5 days for moderate pain. 08/11/20 08/16/20 Yes Bedsole, Amy E, MD  ibuprofen (ADVIL) 200 MG tablet Take 400 mg by mouth every 6 (six) hours as needed for fever, headache or mild pain.   Yes [provider]  insulin lispro (HUMALOG KWIKPEN) 100 UNIT/ML KiwkPen Inject into skin 3 times a day at meal time according to the following sliding scale: CBG 70-120 - 0 units / CBG 121-150 - 1 unit / CBG 151-200 - 2 units / CBG 201-250 - 3 units / CBG 251-300 - 5 units / CBG 301-350 - 7 units / CBG 351-400+ - 9 units  YOU MUST FOLLOW A STRICT DIABETIC DIET Patient taking differently: Inject into the skin See admin instructions. 8 units plus sliding scale Inject into skin 3 times a day at meal time according to the following sliding scale: CBG 70-120 - 0 units / CBG 121-150 - 1 unit / CBG 151-200 - 2 units / CBG 201-250 - 3 units / CBG 251-300 - 5 units / CBG 301-350 - 7 units / CBG 351-400+ - 9 units  YOU MUST FOLLOW A STRICT DIABETIC DIET 07/01/18  Yes Bedsole, Amy E, MD  levothyroxine (SYNTHROID) 175 MCG tablet Take 175 mcg by mouth daily. 06/03/20  Yes [provider]  promethazine (PHENERGAN) 25 MG tablet Take 1 TABLET BY MOUTH EVERY 8 HOUR AS NEEDED FOR NAUSEA AND VOMITINB. Patient taking differently: Take 25 mg by mouth every 8 (eight) hours as needed for nausea or vomiting. Take 1 TABLET BY MOUTH EVERY 8 HOUR AS NEEDED FOR NAUSEA AND VOMITINB. 08/09/20  Yes Bedsole, Amy E, MD    TOUJEO SOLOSTAR 300 UNIT/ML Solostar Pen Inject 24 Units into the skin at bedtime.  07/18/20  Yes [provider]  Continuous Blood Gluc Sensor (FREESTYLE LIBRE 14 DAY SENSOR) MISC by Does not apply route. 04/01/19   [provider]  fexofenadine (ALLEGRA) 180 MG tablet Take 1 tablet (180 mg total) by mouth daily. Patient not taking: Reported on 08/16/2020 02/11/20   Jinny Sanders, MD  Insulin Pen Needle (PEN NEEDLES) 31G X 8 MM MISC Use to inject insulin 4 times a day.  Dx: E11.10 12/23/17   Jinny Sanders, MD  Lancets (ONETOUCH ULTRASOFT) lancets Use to check blood sugar daily as needed.  Dx: E11.10 12/23/17   Jinny Sanders, MD  metoCLOPramide (REGLAN) 5 MG tablet Take 5mg  before breakfast lunch and dinner and 10mg  at bedtime Patient not taking: Reported on 08/16/2020 04/29/19   Milus Banister, MD  metoprolol succinate (TOPROL-XL) 25 MG 24 hr tablet Take 1 tablet (25 mg total) by mouth daily. Patient not taking: Reported on 08/16/2020 04/08/20   Minus Breeding, MD  [facility] VERIO test strip USE TO CHECK BLOOD SUGAR DAILY AS NEEDED. DX: E11.10 Patient not taking: Reported on 08/09/2020 09/09/19   Jinny Sanders, MD  pantoprazole (PROTONIX) 40 MG tablet Take 1 tablet (40 mg total) by mouth 2 (two) times daily before a meal. Patient not taking: Reported on 08/16/2020 06/03/19   Florencia Reasons, MD  predniSONE (DELTASONE) 10 MG tablet 3 tabs by mouth daily x 3 days, then 2 tabs by mouth daily x 2 days then 1 tab by mouth daily x 2 days 08/09/20   Diona Browner, Amy E, MD  rosuvastatin (CRESTOR) 40 MG tablet TAKE 1 TABLET BY MOUTH EVERY DAY Patient not taking: Reported on 08/16/2020 04/12/20   Minus Breeding, MD  sertraline (ZOLOFT) 100 MG tablet TAKE 1 TABLET BY MOUTH EVERY DAY Patient not taking: Reported on 08/16/2020 11/26/19   Jinny Sanders, MD  TRESIBA FLEXTOUCH 100 UNIT/ML SOPN FlexTouch Pen Inject 0.24 mLs (24 Units total) into the skin at bedtime. On 10/20/18 take at lunch time, on 10/21/18 take at  dinner time. On 10/22/18 resume at bedtime regimen. Patient not taking: Reported on 08/16/2020 10/20/18   Lavina Hamman, MD    Allergies:   Allergies  Allergen Reactions  . Codeine Hives, Anxiety and Other (See Comments)    Social History:  reports that she quit smoking about 29 years ago. Her smoking use included cigarettes. She has a 1.20 pack-year smoking history. She has never used smokeless tobacco. She reports current drug use. Drug: Marijuana. She reports that she does not drink alcohol.  Family History: Family History  Problem Relation Age of Onset  . Diabetes Other   . Heart disease Other   . Heart failure Mother 41       Her heart skipped a beat and stopped - per pt  . Diabetes Maternal Grandmother   . Alzheimer's disease Maternal Grandmother   . Coronary artery disease Maternal Grandmother   . Heart attack Maternal Grandfather   . Heart failure Brother   . Diabetes Brother   . Colon polyps Neg Hx   . Esophageal cancer Neg Hx   . Liver cancer Neg Hx   . Pancreatic cancer Neg Hx   . Stomach cancer Neg Hx   . Crohn's disease Neg Hx   . Rectal cancer Neg Hx      Objective   Physical Exam: Blood pressure (!) 145/98, pulse (!) 119, temperature 97.7 F (36.5 C), temperature source Oral, resp. rate (!) 21, last menstrual period 08/11/2012, SpO2 96 %.  Physical Exam Vitals and nursing note reviewed.  Constitutional:      Appearance: Normal appearance. She is ill-appearing.  HENT:     Head: Normocephalic and atraumatic.  Eyes:     Conjunctiva/sclera: Conjunctivae normal.  Cardiovascular:     Rate and Rhythm: Regular rhythm. Tachycardia present.  Pulmonary:     Effort: Pulmonary effort is normal.     Breath sounds: Normal breath sounds.  Abdominal:     General: Abdomen is  flat.     Palpations: Abdomen is soft.  Musculoskeletal:        General: No swelling or tenderness.  Skin:    Coloration: Skin is not jaundiced or pale.  Neurological:     Mental  Status: She is alert. Mental status is at baseline.  Psychiatric:        Mood and Affect: Mood normal.        Behavior: Behavior normal.     LABS on Admission: I have personally reviewed all the labs and imaging below    Basic Metabolic Panel: Recent Labs  Lab 08/15/20 1404 08/16/20 0238  NA 133* 135  K 4.0 3.0*  CL 94* 90*  CO2 22 13*  GLUCOSE 312* 502*  BUN 25* 30*  CREATININE 0.65 0.86  CALCIUM 9.6 9.8   Liver Function Tests: Recent Labs  Lab 08/16/20 0238  AST 16  ALT 12  ALKPHOS 55  BILITOT 1.4*  PROT 8.2*  ALBUMIN 4.8   Recent Labs  Lab 08/16/20 0238  LIPASE 21   No results for input(s): AMMONIA in the last 168 hours. CBC: Recent Labs  Lab 08/15/20 1404 08/15/20 1404 08/16/20 0238  WBC 12.0*  --  26.5*  HGB 17.9*  --  18.8*  HCT 52.3*  --  55.8*  MCV 87.6   < > 88.3  PLT 399  --  439*   < > = values in this interval not displayed.   Cardiac Enzymes: No results for input(s): CKTOTAL, CKMB, CKMBINDEX, TROPONINI in the last 168 hours. BNP: Invalid input(s): POCBNP CBG: Recent Labs  Lab 08/16/20 0748 08/16/20 0910  GLUCAP 393* 279*    Radiological Exams on Admission:  DG Chest 2 View  Result Date: 08/15/2020 CLINICAL DATA:  Chest pain. EXAM: CHEST - 2 VIEW COMPARISON:  May 30, 2019. FINDINGS: The heart size and mediastinal contours are within normal limits. Both lungs are clear. No pneumothorax or pleural effusion is noted. The visualized skeletal structures are unremarkable. IMPRESSION: No active cardiopulmonary disease. Electronically Signed   By: Marijo Conception M.D.   On: 08/15/2020 14:41   CT ABDOMEN PELVIS W CONTRAST  Result Date: 08/16/2020 CLINICAL DATA:  Abdominal pain. Patient complains of left upper quadrant pain. EXAM: CT ABDOMEN AND PELVIS WITH CONTRAST TECHNIQUE: Multidetector CT imaging of the abdomen and pelvis was performed using the standard protocol following bolus administration of intravenous contrast. CONTRAST:  178mL  OMNIPAQUE IOHEXOL 300 MG/ML  SOLN COMPARISON:  CT of the abdomen and pelvis 05/31/2019. FINDINGS: Lower chest: The lung bases are clear without focal nodule, mass, or airspace disease. Heart size is normal. No significant pleural or pericardial effusion is present. Hepatobiliary: No focal liver abnormality is seen. No gallstones, gallbladder wall thickening, or biliary dilatation. 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 Pancreas: Unremarkable. No pancreatic ductal dilatation or surrounding inflammatory changes. Spleen: Normal in size without focal abnormality. Adrenals/Urinary Tract: Adrenal glands are unremarkable. Kidneys are normal, without renal calculi, focal lesion, or hydronephrosis. Bladder is moderately distended. No obstructing lesion is present. Stomach/Bowel: Circumferential thickening is present in the distal esophagus. The stomach is markedly distended. No obstructing lesion is present. Proximal duodenum is unremarkable. Similar appearance was present previously. The small bowel is within normal limits. Terminal ileum is normal. The appendix is visualized and normal. The ascending and transverse colon are within normal limits. Descending and sigmoid colon are mostly collapsed, within normal limits. Vascular/Lymphatic: No significant vascular findings are present. No enlarged abdominal  or pelvic lymph nodes. Reproductive: Status post hysterectomy. No adnexal masses. Other: No abdominal wall hernia or abnormality. No abdominopelvic ascites. Musculoskeletal: No acute or significant osseous findings. IMPRESSION: 1. Circumferential thickening in the distal esophagus compatible with esophagitis. 2. Marked distention of the stomach without an obstructing lesion. Question gastroparesis. Given similar appearance of gastric distension with previous duodenal distension, SMA syndrome is also considered. 3. Status post hysterectomy. Electronically Signed   By: San Morelle  M.D.   On: 08/16/2020 05:30   DG Abd Acute W/Chest  Result Date: 08/16/2020 CLINICAL DATA:  Mid abdominal pain and nausea for 48 hours EXAM: DG ABDOMEN ACUTE W/ 1V CHEST COMPARISON:  Abdomen 06/01/2019 FINDINGS: Normal heart size and pulmonary vascularity. Emphysematous changes in the lungs. No airspace disease or consolidation. No pleural effusions. No pneumothorax. Mediastinal contours appear intact. Scattered gas and stool throughout the colon with some gas-filled small bowel. No small or large bowel distention. No free intra-abdominal air. No abnormal air-fluid levels. No radiopaque stones. Visualized bones and soft tissue contours appear intact. IMPRESSION: Emphysematous changes in the lungs. No evidence of active pulmonary disease. Nonobstructive bowel gas pattern. Electronically Signed   By: Lucienne Capers M.D.   On: 08/16/2020 04:23      EKG: Independently reviewed.    A & P   Principal Problem:   DKA (diabetic ketoacidoses) ([name]) Active Problems:   Hyperlipidemia   Essential hypertension   Paroxysmal A-fib ([name])   Gastroparesis due to DM ([name])   Chronic systolic CHF (congestive heart failure) ([name])   MDD (major depressive disorder), single episode, mild ([place])   1. DKA, likely from prednisone taper a. Poorly controlled diabetes with last HbA1c of 15.0 in February b. Discontinue prednisone c. Check HbA1c d. Insulin drip and IV fluids per DKA protocol e. npo f. Diabetic coordinator consult  2. Hematemesis/Gastroparesis a. GI history: IBS, GERD, Schatzki's ring, H. pylori (treated in 2018), diverticulosis b. Hb stable c. Continue Protonix d. GI consulted  3. HFrEF, stable a. EF in 2018 30 to 35%, EF in 2020 25 to 60% -follows with cardiology outpatient b. Holding home meds as she is npo c. Daily weights and intake/output  4. Right shoulder pain, resolved a. Follows with PCP, thought to be from radiculopathy and started on prednisone taper on 8/31 with empirically  increase long-acting insulin to prevent DKA b. PT eval  5. Hyperlipidemia a. Holding home meds for now  6. Hypertension a. As above  7. Anxiety/depression a. Holding home meds for now due to NPO  8. Paroxysmal Afib a. Not on anticoagulation b. Follows with cardio c. Holding home toprol d. Add on IV lopressor for now     DVT prophylaxis: SCD   Code Status: Full Code  Diet: NPO Family Communication: Admission, patients condition and plan of care including tests being ordered have been discussed with the patient who indicates understanding and agrees with the plan and Code Status. Disposition Plan: The appropriate patient status for this patient is INPATIENT. Inpatient status is judged to be reasonable and necessary in order to provide the required intensity of service to ensure the patient's safety. The patient's presenting symptoms, physical exam findings, and initial radiographic and laboratory data in the context of their chronic comorbidities is felt to place them at high risk for further clinical deterioration. Furthermore, it is not anticipated that the patient will be medically stable for discharge from the hospital within 2 midnights of admission. The following factors support the patient status of inpatient.   "  The patient's presenting symptoms include nausea, vomiting. " The worrisome physical exam findings include appears ill, tachycardia. " The initial radiographic and laboratory data are worrisome because of DKA. " The chronic co-morbidities include poorly controlled diabetes, paroxysmal afib, hypertension, hyperlipidemia, CHF.   * I certify that at the point of admission it is my clinical judgment that the patient will require inpatient hospital care spanning beyond 2 midnights from the point of admission due to high intensity of service, high risk for further deterioration and high frequency of surveillance required.*      The medical decision making on this  patient was of high complexity and the patient is at high risk for clinical deterioration, therefore this is a level 3 admission.  Consultants  . GI . PCCM  Procedures  . none  Time Spent on Admission: 75 minutes    Harold Hedge, DO [facility] Pager [phone] 08/16/2020, 11:09 AM

## 2020-08-17 ENCOUNTER — Encounter (HOSPITAL_COMMUNITY)
Admission: EM | Disposition: A | Discharge status: Home / Self Care | Payer: Self-pay | Source: Home / Self Care | Attending: Family Medicine

## 2020-08-17 ENCOUNTER — Encounter (HOSPITAL_COMMUNITY): Payer: Self-pay | Admitting: Internal Medicine

## 2020-08-17 ENCOUNTER — Inpatient Hospital Stay (HOSPITAL_COMMUNITY): Payer: 59 | Admitting: Anesthesiology

## 2020-08-17 DIAGNOSIS — K922 Gastrointestinal hemorrhage, unspecified: Secondary | ICD-10-CM

## 2020-08-17 DIAGNOSIS — K259 Gastric ulcer, unspecified as acute or chronic, without hemorrhage or perforation: Secondary | ICD-10-CM

## 2020-08-17 DIAGNOSIS — K209 Esophagitis, unspecified without bleeding: Secondary | ICD-10-CM

## 2020-08-17 HISTORY — PX: BIOPSY: SHX5522

## 2020-08-17 HISTORY — PX: ESOPHAGOGASTRODUODENOSCOPY (EGD) WITH PROPOFOL: SHX5813

## 2020-08-17 LAB — BASIC METABOLIC PANEL
Anion gap: 9 (ref 5–15)
Anion gap: 10 (ref 5–15)
Anion gap: 11 (ref 5–15)
BUN: 5 mg/dL — ABNORMAL LOW (ref 6–20)
BUN: 5 mg/dL — ABNORMAL LOW (ref 6–20)
BUN: <5 mg/dL — ABNORMAL LOW (ref 6–20)
CO2: 21 mmol/L — ABNORMAL LOW (ref 22–32)
CO2: 23 mmol/L (ref 22–32)
CO2: 25 mmol/L (ref 22–32)
Calcium: 8.4 mg/dL — ABNORMAL LOW (ref 8.9–10.3)
Calcium: 8.5 mg/dL — ABNORMAL LOW (ref 8.9–10.3)
Calcium: 8.6 mg/dL — ABNORMAL LOW (ref 8.9–10.3)
Chloride: 101 mmol/L (ref 98–111)
Chloride: 101 mmol/L (ref 98–111)
Chloride: 101 mmol/L (ref 98–111)
Creatinine, Ser: 0.34 mg/dL — ABNORMAL LOW (ref 0.44–1.00)
Creatinine, Ser: 0.37 mg/dL — ABNORMAL LOW (ref 0.44–1.00)
Creatinine, Ser: 0.36 mg/dL — ABNORMAL LOW (ref 0.44–1.00)
GFR calc Af Amer: >60 mL/min (ref >60)
GFR calc Af Amer: >60 mL/min (ref >60)
GFR calc Af Amer: >60 mL/min (ref >60)
GFR calc non Af Amer: >60 mL/min (ref >60)
GFR calc non Af Amer: >60 mL/min (ref >60)
GFR calc non Af Amer: >60 mL/min (ref >60)
Glucose, Bld: 282 mg/dL — ABNORMAL HIGH (ref 70–99)
Glucose, Bld: 251 mg/dL — ABNORMAL HIGH (ref 70–99)
Glucose, Bld: 149 mg/dL — ABNORMAL HIGH (ref 70–99)
Potassium: 3.1 mmol/L — ABNORMAL LOW (ref 3.5–5.1)
Potassium: 2.8 mmol/L — ABNORMAL LOW (ref 3.5–5.1)
Potassium: 2.9 mmol/L — ABNORMAL LOW (ref 3.5–5.1)
Sodium: 131 mmol/L — ABNORMAL LOW (ref 135–145)
Sodium: 134 mmol/L — ABNORMAL LOW (ref 135–145)
Sodium: 137 mmol/L (ref 135–145)

## 2020-08-17 LAB — CBC
HCT: 42.9 % (ref 36.0–46.0)
Hemoglobin: 14.9 g/dL (ref 12.0–15.0)
MCH: 29.9 pg (ref 26.0–34.0)
MCHC: 34.7 g/dL (ref 30.0–36.0)
MCV: 86 fL (ref 80.0–100.0)
Platelets: 301 10*3/uL (ref 150–400)
RBC: 4.99 MIL/uL (ref 3.87–5.11)
RDW: 13.4 % (ref 11.5–15.5)
WBC: 18.9 10*3/uL — ABNORMAL HIGH (ref 4.0–10.5)
nRBC: 0 % (ref 0.0–0.2)

## 2020-08-17 LAB — GLUCOSE, CAPILLARY
Glucose-Capillary: 201 mg/dL — ABNORMAL HIGH (ref 70–99)
Glucose-Capillary: 197 mg/dL — ABNORMAL HIGH (ref 70–99)
Glucose-Capillary: 138 mg/dL — ABNORMAL HIGH (ref 70–99)

## 2020-08-17 LAB — CBG MONITORING, ED
Glucose-Capillary: 229 mg/dL — ABNORMAL HIGH (ref 70–99)
Glucose-Capillary: 164 mg/dL — ABNORMAL HIGH (ref 70–99)

## 2020-08-17 LAB — BETA-HYDROXYBUTYRIC ACID: Beta-Hydroxybutyric Acid: 0.97 mmol/L — ABNORMAL HIGH (ref 0.05–0.27)

## 2020-08-17 LAB — MAGNESIUM: Magnesium: 1.4 mg/dL — ABNORMAL LOW (ref 1.7–2.4)

## 2020-08-17 LAB — HEMOGLOBIN A1C
Hgb A1c MFr Bld: 13.3 % — ABNORMAL HIGH (ref 4.8–5.6)
Mean Plasma Glucose: 335.01 mg/dL

## 2020-08-17 SURGERY — ESOPHAGOGASTRODUODENOSCOPY (EGD) WITH PROPOFOL
Anesthesia: Monitor Anesthesia Care

## 2020-08-17 MED ORDER — CYCLOBENZAPRINE HCL 10 MG PO TABS: 10.0000 mg | ORAL_TABLET | Freq: Every evening | ORAL | Status: DC | PRN

## 2020-08-17 MED ORDER — POTASSIUM CHLORIDE CRYS ER 20 MEQ PO TBCR
40.0000 meq | EXTENDED_RELEASE_TABLET | Freq: Two times a day (BID) | ORAL | Status: AC
  Administered 2020-08-17 – 2020-08-18 (×3): 40 meq via ORAL
  Filled 2020-08-17 (×3): qty 2

## 2020-08-17 MED ORDER — PROPOFOL 500 MG/50ML IV EMUL
INTRAVENOUS | Status: AC
  Filled 2020-08-17: qty 50

## 2020-08-17 MED ORDER — HYDROCODONE-ACETAMINOPHEN 5-325 MG PO TABS
1.0000 | ORAL_TABLET | Freq: Three times a day (TID) | ORAL | Status: DC | PRN
  Administered 2020-08-17 – 2020-08-19 (×3): 1 via ORAL
  Filled 2020-08-17 (×3): qty 1

## 2020-08-17 MED ORDER — SERTRALINE HCL 100 MG PO TABS: 100.0000 mg | ORAL_TABLET | Freq: Every day | ORAL | Status: DC

## 2020-08-17 MED ORDER — POTASSIUM CHLORIDE IN NACL 40-0.9 MEQ/L-% IV SOLN
INTRAVENOUS | Status: DC
  Filled 2020-08-17 (×3): qty 1000

## 2020-08-17 MED ORDER — FENTANYL CITRATE (PF) 100 MCG/2ML IJ SOLN
25.0000 ug | Freq: Once | INTRAMUSCULAR | Status: AC
  Administered 2020-08-17: 25 ug via INTRAVENOUS
  Filled 2020-08-17: qty 2

## 2020-08-17 MED ORDER — INSULIN ASPART 100 UNIT/ML ~~LOC~~ SOLN
0.0000 [IU] | SUBCUTANEOUS | Status: DC
  Administered 2020-08-17: 1 [IU] via SUBCUTANEOUS
  Administered 2020-08-17: 2 [IU] via SUBCUTANEOUS
  Administered 2020-08-18: 1 [IU] via SUBCUTANEOUS
  Filled 2020-08-17: qty 0.06

## 2020-08-17 MED ORDER — METOCLOPRAMIDE HCL 5 MG/ML IJ SOLN
5.0000 mg | Freq: Three times a day (TID) | INTRAMUSCULAR | Status: DC
  Administered 2020-08-17 – 2020-08-18 (×3): 5 mg via INTRAVENOUS
  Filled 2020-08-17 (×3): qty 2

## 2020-08-17 MED ORDER — LACTATED RINGERS IV SOLN: INTRAVENOUS | Status: DC | PRN

## 2020-08-17 MED ORDER — PANTOPRAZOLE SODIUM 40 MG IV SOLR
40.0000 mg | Freq: Two times a day (BID) | INTRAVENOUS | Status: DC
  Administered 2020-08-17: 40 mg via INTRAVENOUS
  Filled 2020-08-17: qty 40

## 2020-08-17 MED ORDER — PROPOFOL 10 MG/ML IV BOLUS
INTRAVENOUS | Status: DC | PRN
  Administered 2020-08-17: 40 mg via INTRAVENOUS
  Administered 2020-08-17: 50 mg via INTRAVENOUS
  Administered 2020-08-17: 20 mg via INTRAVENOUS

## 2020-08-17 MED ORDER — PROPOFOL 500 MG/50ML IV EMUL
INTRAVENOUS | Status: DC | PRN
  Administered 2020-08-17: 150 ug/kg/min via INTRAVENOUS

## 2020-08-17 MED ORDER — LIDOCAINE 2% (20 MG/ML) 5 ML SYRINGE
INTRAMUSCULAR | Status: DC | PRN
  Administered 2020-08-17: 100 mg via INTRAVENOUS

## 2020-08-17 MED ORDER — ONDANSETRON HCL 4 MG/2ML IJ SOLN
4.0000 mg | INTRAMUSCULAR | Status: DC
  Administered 2020-08-17 – 2020-08-18 (×4): 4 mg via INTRAVENOUS
  Filled 2020-08-17 (×5): qty 2

## 2020-08-17 MED ORDER — SODIUM CHLORIDE 0.9 % IV SOLN: INTRAVENOUS | Status: DC

## 2020-08-17 SURGICAL SUPPLY — 15 items

## 2020-08-17 NOTE — Progress Notes (Signed)
PROGRESS NOTE    [name] [name]  QJJ:[id number] DOB: 02/22/1961 DOA: 08/16/2020 PCP: Jinny Sanders, MD  Brief Narrative:  59 year old community dwelling white Fem Poorly controlled DM TY 2 with multiple admissions for DKA Prior admission 05/30/2019 with possible upper GI bleed-enteroscopy = grade D esophagitis + D2 ulcers 10 mm normal stomach proximal jejunum-gastric emptying study negative for 2018  H. pylori Rx 2018 Schatzki ring diverticular disease IBS, hypothyroid, A. fib HFrEF Recent steroid burst + muscle relaxants + cervical collar 08/09/2020 at PCP office for acute right shoulder pain?  Cervical radiculopathy-at that office visit long-acting insulin was increased at the time Came to emergency room with nausea vomiting and DKA anion gap 32 ketones 7 Initially consulted upon by critical care but then transferred to hospitalist as stabilized GI consulted-they are planning on probably doing endoscopy when patient more stable which    Assessment & Plan:   Principal Problem:   DKA (diabetic ketoacidoses) ([place]) Active Problems:   Hyperlipidemia   Essential hypertension   Paroxysmal A-fib ([place])   Gastroparesis due to DM ([place])   Chronic systolic CHF (congestive heart failure) ([place])   MDD (major depressive disorder), single episode, mild ([place])   1. DKA secondary to prednisone taper 2. 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 DKA physiology now resolved b. We will try to avoid steroids going forward in the future this may be difficult as evidenced by PCPs efforts to cover her prednisone taper c. Resuming Reglan as IV for now d. Home dose long-acting insulin 24 units decreased to 16 cover with very sensitive sliding scale check trends 4 times daily AC at bedtime 3. Hematemesis with gastroparesis a. No bleeding ulcer no stigmata of the same on EGD performed 9/8 b.  PTA on prednisone, ibuprofen, which have both been held  secondary to high risk of worsening bleed c. EGD showed grade D esophagitis with erosive gastropathy d. Diet can be advanced as tolerated per them e. PPI X 12 weeks 6 Carafate X 14 days 3 times daily f. Monitor in a.m. 4. Severe hypokalemia 2.8  a. Magnesium in a.m. b. Give IV fluid 100 cc/h with 40 of K, give potassium chloride 40 twice daily c. Repeat labs a.m. 5. Leukocytosis a. Likely from stress of vomiting versus steroids b. No focal source of infection so hold antibiotics at this time 6. atrial fibrillation CHADS2 score >2 not on anticoagulation a. Likely not on anticoagulation secondary to acute GI bleeds in the past 7. Treated H. pylori 2018 8. HFrEF EF 30-35% with improvement-->60% 2020 9. Right shoulder pain a. See above i. Continue Flexeril 10 3 times daily for spasm may use prior to admission Norco ii. May benefit from occupational therapy/chiropractic/acupuncture-we will ask OT to evaluate in the morning 10. Hyperlipidemia a. Hold cholesterol meds until discharge 11. Hypothyroidism a. Continue Synthroid 175 daily and outpatient follow-up 12. HTN a. Resume metoprolol XL 25 if continues to be elevated   DVT prophylaxis: SCD Code Status: Full code Family Communication: None Disposition:   Status is: Inpatient  Remains inpatient appropriate because:Hemodynamically unstable and Persistent severe electrolyte disturbances   Dispo: The patient is from: Home              Anticipated d/c is to: Home              Anticipated d/c date is: 2 days              Patient currently is  not medically stable to d/c.       Consultants:   GI  Critical care  Procedures: Upper endoscopy 9/8  Antimicrobials: None currently   Subjective: Just finished endoscopy doing fair no pain no nausea no vomiting No fever no chills No chest pain EOMI NCAT  Objective: Vitals:   08/17/20 0400 08/17/20 0500 08/17/20 0530 08/17/20 0600  BP: (!) 143/79 123/77 (!) 152/94 137/82    Pulse: 91 95 95 93  Resp: 19 (!) 22 (!) 21 18  Temp:      TempSrc:      SpO2: 97% 98% 97% 98%    Intake/Output Summary (Last 24 hours) at 08/17/2020 0656 Last data filed at 08/17/2020 3818 Gross per 24 hour  Intake 1981.42 ml  Output 3750 ml  Net -1768.58 ml   There were no vitals filed for this visit.  Examination:  General exam: Quite frail EOMI NCAT Respiratory system: Clinically clear no rales no rhonchi no adventitious sounds Cardiovascular system: S1-S2 no murmur no rub no gallop Gastrointestinal system: Soft no rebound no guarding no epigastric tenderness no organomegaly. Central nervous system: Power 5/5 moving all 4 limbs equally Extremities: ROM intact, gross motor intact sensory grossly intact Skin: ROM intact no edema Psychiatry: Pleasant euthymic  Data Reviewed: I have personally reviewed following labs and imaging studies Potassium 2.8 CO2 23 anion gap 10 WBC 18.9   Radiology Studies: DG Chest 2 View  Result Date: 08/15/2020 CLINICAL DATA:  Chest pain. EXAM: CHEST - 2 VIEW COMPARISON:  May 30, 2019. FINDINGS: The heart size and mediastinal contours are within normal limits. Both lungs are clear. No pneumothorax or pleural effusion is noted. The visualized skeletal structures are unremarkable. IMPRESSION: No active cardiopulmonary disease. Electronically Signed   By: Marijo Conception M.D.   On: 08/15/2020 14:41   CT ABDOMEN PELVIS W CONTRAST  Result Date: 08/16/2020 CLINICAL DATA:  Abdominal pain. Patient complains of left upper quadrant pain. EXAM: CT ABDOMEN AND PELVIS WITH CONTRAST TECHNIQUE: Multidetector CT imaging of the abdomen and pelvis was performed using the standard protocol following bolus administration of intravenous contrast. CONTRAST:  179mL OMNIPAQUE IOHEXOL 300 MG/ML  SOLN COMPARISON:  CT of the abdomen and pelvis 05/31/2019. FINDINGS: Lower chest: The lung bases are clear without focal nodule, mass, or airspace disease. Heart size is normal. No  significant pleural or pericardial effusion is present. Hepatobiliary: No focal liver abnormality is seen. No gallstones, gallbladder wall thickening, or biliary dilatation. 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 Pancreas: Unremarkable. No pancreatic ductal dilatation or surrounding inflammatory changes. Spleen: Normal in size without focal abnormality. Adrenals/Urinary Tract: Adrenal glands are unremarkable. Kidneys are normal, without renal calculi, focal lesion, or hydronephrosis. Bladder is moderately distended. No obstructing lesion is present. Stomach/Bowel: Circumferential thickening is present in the distal esophagus. The stomach is markedly distended. No obstructing lesion is present. Proximal duodenum is unremarkable. Similar appearance was present previously. The small bowel is within normal limits. Terminal ileum is normal. The appendix is visualized and normal. The ascending and transverse colon are within normal limits. Descending and sigmoid colon are mostly collapsed, within normal limits. Vascular/Lymphatic: No significant vascular findings are present. No enlarged abdominal or pelvic lymph nodes. Reproductive: Status post hysterectomy. No adnexal masses. Other: No abdominal wall hernia or abnormality. No abdominopelvic ascites. Musculoskeletal: No acute or significant osseous findings. IMPRESSION: 1. Circumferential thickening in the distal esophagus compatible with esophagitis. 2. Marked distention of the stomach without an obstructing lesion. Question  gastroparesis. Given similar appearance of gastric distension with previous duodenal distension, SMA syndrome is also considered. 3. Status post hysterectomy. Electronically Signed   By: San Morelle M.D.   On: 08/16/2020 05:30   DG Abd Acute W/Chest  Result Date: 08/16/2020 CLINICAL DATA:  Mid abdominal pain and nausea for 48 hours EXAM: DG ABDOMEN ACUTE W/ 1V CHEST COMPARISON:  Abdomen 06/01/2019  FINDINGS: Normal heart size and pulmonary vascularity. Emphysematous changes in the lungs. No airspace disease or consolidation. No pleural effusions. No pneumothorax. Mediastinal contours appear intact. Scattered gas and stool throughout the colon with some gas-filled small bowel. No small or large bowel distention. No free intra-abdominal air. No abnormal air-fluid levels. No radiopaque stones. Visualized bones and soft tissue contours appear intact. IMPRESSION: Emphysematous changes in the lungs. No evidence of active pulmonary disease. Nonobstructive bowel gas pattern. Electronically Signed   By: Lucienne Capers M.D.   On: 08/16/2020 04:23     Scheduled Meds: . insulin aspart  0-5 Units Subcutaneous QHS  . insulin aspart  0-9 Units Subcutaneous TID WC  . insulin glargine  16 Units Subcutaneous Q24H  . metoprolol tartrate  5 mg Intravenous Q12H  . ondansetron (ZOFRAN) IV  4 mg Intravenous Q6H  . sucralfate  1 g Oral TID WC & HS   Continuous Infusions: . dextrose 5% lactated ringers 75 mL/hr at 08/17/20 0639  . lactated ringers Stopped (08/16/20 1116)  . lactated ringers Stopped (08/16/20 1122)  . pantoprozole (PROTONIX) infusion 8 mg/hr (08/17/20 0217)     LOS: 1 day    Time spent: [place], MD [facility] To contact the attending provider between 7A-7P or the covering provider during after hours 7P-7A, please log into the web site [email or web address] and access using universal [place] password for that web site. If you do not have the password, please call the hospital operator.  08/17/2020, 6:56 AM

## 2020-08-17 NOTE — Anesthesia Procedure Notes (Signed)
Procedure Name: MAC Date/Time: 08/17/2020 1:54 PM Performed by: Deliah Boston, CRNA Pre-anesthesia Checklist: Patient identified, Emergency Drugs available, Suction available and Patient being monitored Patient Re-evaluated:Patient Re-evaluated prior to induction Oxygen Delivery Method: Simple face mask Placement Confirmation: positive ETCO2 and breath sounds checked- equal and bilateral

## 2020-08-17 NOTE — Anesthesia Preprocedure Evaluation (Signed)
Anesthesia Evaluation  Patient identified by MRN, date of birth, ID band Patient awake    Reviewed: Allergy & Precautions, H&P , NPO status , Patient's Chart, lab work & pertinent test results, reviewed documented beta blocker date and time   History of Anesthesia Complications Negative for: history of anesthetic complications  Airway Mallampati: III  TM Distance: <3 FB Neck ROM: full    Dental  (+) Poor Dentition   Pulmonary former smoker,    Pulmonary exam normal breath sounds clear to auscultation       Cardiovascular Exercise Tolerance: Good hypertension, Pt. on home beta blockers and Pt. on medications Normal cardiovascular exam Rhythm:regular Rate:Normal  hyperlipidemia   Neuro/Psych  Headaches (every other month), Anxiety Depression  Neuromuscular disease    GI/Hepatic Neg liver ROS, PUD, GERD  Medicated and Controlled,  Endo/Other  diabetes, Type 2, Oral Hypoglycemic Agents, Insulin DependentHypothyroidism   Renal/GU negative Renal ROS     Musculoskeletal   Abdominal Normal abdominal exam  (+)   Peds  Hematology negative hematology ROS (+)   Anesthesia Other Findings   Reproductive/Obstetrics                             Anesthesia Physical  Anesthesia Plan  ASA: II  Anesthesia Plan: MAC   Post-op Pain Management:    Induction:   PONV Risk Score and Plan: 2 and Ondansetron and Propofol infusion  Airway Management Planned: Nasal Cannula, Natural Airway and Mask  Additional Equipment:   Intra-op Plan:   Post-operative Plan:   Informed Consent: I have reviewed the patients History and Physical, chart, labs and discussed the procedure including the risks, benefits and alternatives for the proposed anesthesia with the patient or authorized representative who has indicated his/her understanding and acceptance.     Dental Advisory Given  Plan Discussed with: Surgeon and  CRNA  Anesthesia Plan Comments:         Anesthesia Quick Evaluation

## 2020-08-17 NOTE — Op Note (Signed)
[facility] Patient Name: [name] [name] Procedure Date: 08/17/2020 MRN: [id number] Attending MD: Thornton Park MD, MD Date of Birth: 1961/08/24 CSN: [id number] Age: 59 Admit Type: Inpatient Procedure:                Upper GI endoscopy Indications:              Hematemesis, Abnormal CT of the GI tract - distal                            esophageal thickening Providers:                Thornton Park MD, MD, Cleda Daub, RN,                            Fransico Setters Mbumina, Technician Referring MD:              Medicines:                Monitored Anesthesia Care Complications:            No immediate complications. Estimated blood loss:                            Minimal. Estimated Blood Loss:     Estimated blood loss was minimal. Procedure:                Pre-Anesthesia Assessment:                           - Prior to the procedure, a History and Physical                            was performed, and patient medications and                            allergies were reviewed. The patient's tolerance of                            previous anesthesia was also reviewed. The risks                            and benefits of the procedure and the sedation                            options and risks were discussed with the patient.                            All questions were answered, and informed consent                            was obtained. Prior Anticoagulants: The patient has                            taken no previous anticoagulant or antiplatelet                            agents. ASA  Grade Assessment: III - A patient with                            severe systemic disease. After reviewing the risks                            and benefits, the patient was deemed in                            satisfactory condition to undergo the procedure.                           After obtaining informed consent, the endoscope was                            passed under direct  vision. Throughout the                            procedure, the patient's blood pressure, pulse, and                            oxygen saturations were monitored continuously. The                            GIF-H190 ([id number]) Olympus gastroscope was                            introduced through the mouth, and advanced to the                            third part of duodenum. The upper GI endoscopy was                            accomplished without difficulty. The patient                            tolerated the procedure well. Scope In: Scope Out: Findings:      LA Grade D (one or more mucosal breaks involving at least 75% of       esophageal circumference) esophagitis with no bleeding was found 26 cm       from the incisors to the location of the z-line at 38 cm from the       incisors. Biopsies were taken with a cold forceps for histology. The       remainder of the esophageal mucosa appeared normal. Estimated blood loss       was minimal. No evidence for recent bleeding.      A few small erosions with stigmata of recent bleeding were found in the       gastric antrum and distal body. Biopsies were taken from the antrum,       body, and fundus with a cold forceps for histology. Estimated blood loss       was minimal. No blood present.      Diffuse mildly erythematous mucosa without active bleeding was found in       the duodenal  bulb.      The cardia and gastric fundus were normal on retroflexion.      The exam was otherwise without abnormality. Impression:               - LA Grade D esophagitis with no bleeding.                            Biopsied. The likely souce for bleeding and CT scan                            findings.                           - Erosive gastropathy with stigmata of recent                            bleeding. Biopsied.                           - Mild erythematous duodenopathy.                           - The examination was otherwise normal. Moderate  Sedation:      Not Applicable - Patient had care per Anesthesia. Recommendation:           - Return patient to hospital ward for ongoing care.                           - Advance diet as tolerated.                           - Discontinue continuous Protonix infusion. Use                            Protonix (pantoprazole) 40 mg PO IV. Convert to                            oral formulation when able to take PO. Complete at                            least 12 weeks of therapy.                           - Use sucralfate suspension 1 gram PO QID x 14 days.                           - Await pathology results.                           - Keep the head of the bed elevated.                           - Follow serial hemoglobin/hematocrit with                            transfusion as  indicated.                           - The [facility] inpatient GI team will move to                            stand-by. Please call the on-call                            gastroenterologist with any additional questions or                            concerns during this hospitalization. Procedure Code(s):        --- Professional ---                           [phone], Esophagogastroduodenoscopy, flexible,                            transoral; with biopsy, single or multiple Diagnosis Code(s):        --- Professional ---                           K20.90, Esophagitis, unspecified without bleeding                           K92.2, Gastrointestinal hemorrhage, unspecified                           K31.89, Other diseases of stomach and duodenum                           K92.0, Hematemesis                           R93.3, Abnormal findings on diagnostic imaging of                            other parts of digestive tract CPT copyright 2019 American Medical Association. All rights reserved. The codes documented in this report are preliminary and upon coder review may  be revised to meet current compliance requirements. Thornton Park MD, MD 08/17/2020 2:25:46 PM This report has been signed electronically. Number of Addenda: 0

## 2020-08-17 NOTE — Progress Notes (Signed)
Inpatient Diabetes Program Recommendations  AACE/ADA: New Consensus Statement on Inpatient Glycemic Control (2015)  Target Ranges:  Prepandial:   less than 140 mg/dL      Peak postprandial:   less than 180 mg/dL (1-2 hours)      Critically ill patients:  140 - 180 mg/dL   Lab Results  Component Value Date   GLUCAP 229 (H) 08/17/2020   HGBA1C 15.0 (A) 01/14/2020    Review of Glycemic Control  Diabetes history: DM1 Outpatient Diabetes medications: Toujeo 24 units QHS, Humalog 8 units tidwc + 0-9 units tid Current orders for Inpatient glycemic control: Lantus 16 units Q24H, Novolog 0-6 units Q4H  HgbA1C - 13.3%. Down from 15% on 01/14/20  Inpatient Diabetes Program Recommendations:     Increase Lantus to 20 units QD Increase Novolog to 0-9 units Q4H When po intake starts, change Novolog to tidwc and hs  Will speak with pt today regarding her diabetes control and HgbA1C.  Continue to follow.  Thank you. Lorenda Peck, RD, LDN, CDE Inpatient Diabetes Coordinator [phone]

## 2020-08-17 NOTE — Transfer of Care (Signed)
Immediate Anesthesia Transfer of Care Note  Patient: Yoneko C Fuhs  Procedure(s) Performed: Procedure(s): ESOPHAGOGASTRODUODENOSCOPY (EGD) WITH PROPOFOL (N/A) BIOPSY  Patient Location: PACU  Anesthesia Type:MAC  Level of Consciousness: Patient easily awoken, sedated, comfortable, cooperative, following commands, responds to stimulation.   Airway & Oxygen Therapy: Patient spontaneously breathing, ventilating well, oxygen via simple oxygen mask.  Post-op Assessment: Report given to PACU RN, vital signs reviewed and stable, moving all extremities.   Post vital signs: Reviewed and stable.  Complications: No apparent anesthesia complications  Last Vitals:  Vitals Value Taken Time  BP 133/89 08/17/20 1416  Temp    Pulse 97 08/17/20 1419  Resp 21 08/17/20 1419  SpO2 100 % 08/17/20 1419  Vitals shown include unvalidated device data.  Last Pain:  Vitals:   08/17/20 1416  TempSrc:   PainSc: 0-No pain         Complications: No complications documented.

## 2020-08-17 NOTE — Anesthesia Postprocedure Evaluation (Signed)
Anesthesia Post Note  Patient: [name] [name]  Procedure(s) Performed: ESOPHAGOGASTRODUODENOSCOPY (EGD) WITH PROPOFOL (N/A ) BIOPSY     Patient location during evaluation: PACU Anesthesia Type: MAC Level of consciousness: awake and alert Pain management: pain level controlled Vital Signs Assessment: post-procedure vital signs reviewed and stable Respiratory status: spontaneous breathing, nonlabored ventilation, respiratory function stable and patient connected to nasal cannula oxygen Cardiovascular status: stable and blood pressure returned to baseline Postop Assessment: no apparent nausea or vomiting Anesthetic complications: no   No complications documented.  Last Vitals:  Vitals:   08/17/20 1515 08/17/20 1535  BP: (!) 160/96 (!) 146/95  Pulse: 97 98  Resp:  16  Temp:  36.7 C  SpO2:  100%    Last Pain:  Vitals:   08/17/20 1545  TempSrc:   PainSc: 0-No pain                 Tiajuana Amass

## 2020-08-17 NOTE — Interval H&P Note (Signed)
History and Physical Interval Note:  08/17/2020 1:24 PM  [name] [name]  has presented today for surgery, with the diagnosis of hematemesis.  The various methods of treatment have been discussed with the patient and family. After consideration of risks, benefits and other options for treatment, the patient has consented to  Procedure(s): ESOPHAGOGASTRODUODENOSCOPY (EGD) WITH PROPOFOL (N/A) as a surgical intervention.  The patient's history has been reviewed, patient examined, no change in status, stable for surgery.  I have reviewed the patient's chart and labs.  Questions were answered to the patient's satisfaction.     Thornton Park

## 2020-08-18 ENCOUNTER — Other Ambulatory Visit: Payer: Self-pay

## 2020-08-18 ENCOUNTER — Encounter (HOSPITAL_COMMUNITY): Payer: Self-pay | Admitting: Gastroenterology

## 2020-08-18 LAB — CBC WITH DIFFERENTIAL/PLATELET
Abs Immature Granulocytes: 0.08 10*3/uL — ABNORMAL HIGH (ref 0.00–0.07)
Basophils Absolute: 0 10*3/uL (ref 0.0–0.1)
Basophils Relative: 0 %
Eosinophils Absolute: 0.1 10*3/uL (ref 0.0–0.5)
Eosinophils Relative: 1 %
HCT: 46.3 % — ABNORMAL HIGH (ref 36.0–46.0)
Hemoglobin: 16.3 g/dL — ABNORMAL HIGH (ref 12.0–15.0)
Immature Granulocytes: 1 %
Lymphocytes Relative: 29 %
Lymphs Abs: 3.7 10*3/uL (ref 0.7–4.0)
MCH: 29.6 pg (ref 26.0–34.0)
MCHC: 35.2 g/dL (ref 30.0–36.0)
MCV: 84.2 fL (ref 80.0–100.0)
Monocytes Absolute: 0.9 10*3/uL (ref 0.1–1.0)
Monocytes Relative: 7 %
Neutro Abs: 7.9 10*3/uL — ABNORMAL HIGH (ref 1.7–7.7)
Neutrophils Relative %: 62 %
Platelets: 285 10*3/uL (ref 150–400)
RBC: 5.5 MIL/uL — ABNORMAL HIGH (ref 3.87–5.11)
RDW: 13.2 % (ref 11.5–15.5)
WBC: 12.7 10*3/uL — ABNORMAL HIGH (ref 4.0–10.5)
nRBC: 0 % (ref 0.0–0.2)

## 2020-08-18 LAB — COMPREHENSIVE METABOLIC PANEL
ALT: 11 U/L (ref 0–44)
AST: 12 U/L — ABNORMAL LOW (ref 15–41)
Albumin: 3.5 g/dL (ref 3.5–5.0)
Alkaline Phosphatase: 49 U/L (ref 38–126)
Anion gap: 12 (ref 5–15)
BUN: 6 mg/dL (ref 6–20)
CO2: 22 mmol/L (ref 22–32)
Calcium: 8.5 mg/dL — ABNORMAL LOW (ref 8.9–10.3)
Chloride: 102 mmol/L (ref 98–111)
Creatinine, Ser: 0.44 mg/dL (ref 0.44–1.00)
GFR calc Af Amer: >60 mL/min (ref >60)
GFR calc non Af Amer: >60 mL/min (ref >60)
Glucose, Bld: 121 mg/dL — ABNORMAL HIGH (ref 70–99)
Potassium: 3.4 mmol/L — ABNORMAL LOW (ref 3.5–5.1)
Sodium: 136 mmol/L (ref 135–145)
Total Bilirubin: 1 mg/dL (ref 0.3–1.2)
Total Protein: 6.4 g/dL — ABNORMAL LOW (ref 6.5–8.1)

## 2020-08-18 LAB — GLUCOSE, CAPILLARY
Glucose-Capillary: 104 mg/dL — ABNORMAL HIGH (ref 70–99)
Glucose-Capillary: 110 mg/dL — ABNORMAL HIGH (ref 70–99)
Glucose-Capillary: 123 mg/dL — ABNORMAL HIGH (ref 70–99)
Glucose-Capillary: 124 mg/dL — ABNORMAL HIGH (ref 70–99)
Glucose-Capillary: 130 mg/dL — ABNORMAL HIGH (ref 70–99)
Glucose-Capillary: 154 mg/dL — ABNORMAL HIGH (ref 70–99)

## 2020-08-18 LAB — SURGICAL PATHOLOGY

## 2020-08-18 LAB — MAGNESIUM: Magnesium: 1.3 mg/dL — ABNORMAL LOW (ref 1.7–2.4)

## 2020-08-18 MED ORDER — LEVOTHYROXINE SODIUM 75 MCG PO TABS
175.0000 ug | ORAL_TABLET | Freq: Every day | ORAL | Status: DC
  Administered 2020-08-18 – 2020-08-19 (×2): 175 ug via ORAL
  Filled 2020-08-18 (×2): qty 1

## 2020-08-18 MED ORDER — ACETAMINOPHEN 325 MG PO TABS: 650.0000 mg | ORAL_TABLET | Freq: Four times a day (QID) | ORAL | Status: DC | PRN

## 2020-08-18 MED ORDER — PANTOPRAZOLE SODIUM 40 MG PO TBEC
40.0000 mg | DELAYED_RELEASE_TABLET | Freq: Two times a day (BID) | ORAL | Status: DC
  Administered 2020-08-18 – 2020-08-19 (×3): 40 mg via ORAL
  Filled 2020-08-18 (×3): qty 1

## 2020-08-18 MED ORDER — MAGNESIUM SULFATE 2 GM/50ML IV SOLN
2.0000 g | Freq: Once | INTRAVENOUS | Status: AC
  Administered 2020-08-18: 2 g via INTRAVENOUS
  Filled 2020-08-18: qty 50

## 2020-08-18 MED ORDER — METOPROLOL SUCCINATE ER 25 MG PO TB24
25.0000 mg | ORAL_TABLET | Freq: Every day | ORAL | Status: DC
  Administered 2020-08-18 – 2020-08-19 (×2): 25 mg via ORAL
  Filled 2020-08-18 (×2): qty 1

## 2020-08-18 MED ORDER — POTASSIUM CHLORIDE CRYS ER 20 MEQ PO TBCR
40.0000 meq | EXTENDED_RELEASE_TABLET | Freq: Two times a day (BID) | ORAL | Status: DC
  Administered 2020-08-18 – 2020-08-19 (×2): 40 meq via ORAL
  Filled 2020-08-18 (×2): qty 2

## 2020-08-18 MED ORDER — METOCLOPRAMIDE HCL 5 MG PO TABS
5.0000 mg | ORAL_TABLET | Freq: Three times a day (TID) | ORAL | Status: DC
  Administered 2020-08-18 – 2020-08-19 (×4): 5 mg via ORAL
  Filled 2020-08-18 (×4): qty 1

## 2020-08-18 NOTE — Evaluation (Signed)
Occupational Therapy Evaluation Patient Details Name: [name] [name] MRN: [id number] DOB: 26-Aug-1961 Today's Date: 08/18/2020    History of Present Illness Pt is a 59 y.o. female admitted 08/16/20 with nausea and vomiting; found to be in diabetic ketoacidosis. EGD performed on 08/17/20. Peritnent PMH includes uncontrolled DMII, polysubstance abuse, GERD, HTN, HLD, cardiopathy, diabetic peripheral neuropathy, CKD III, depression.   Clinical Impression   Patient evaluated by Occupational Therapy with no further acute OT needs identified. All education has been completed and the patient has no further questions.  See below for any follow-up Occupational Therapy or equipment needs. OT is signing off. Thank you for this referral.     Follow Up Recommendations  No OT follow up    Equipment Recommendations       Recommendations for Other Services       Precautions / Restrictions Precautions Precautions: Fall Restrictions Weight Bearing Restrictions: No      Mobility Bed Mobility Overal bed mobility: Independent             General bed mobility comments: HOB partially elevated  Transfers Overall transfer level: Independent Equipment used: None             General transfer comment: Pt stood from EOB: Independently. Pt ambulated without AD in hallway ~375', initially with apparent antalgic gait and slight limp which pt stated was due to feeling stiff.  After ~50;, gait more normalized with slow but steady cadence and no LOB.    Balance Overall balance assessment: Independent                                         ADL either performed or assessed with clinical judgement   ADL Overall ADL's : Independent                                             Vision Baseline Vision/History: No visual deficits Patient Visual Report: No change from baseline Vision Assessment?: No apparent visual deficits     Perception     Praxis       Pertinent Vitals/Pain Pain Assessment: No/denies pain     Hand Dominance Right   Extremity/Trunk Assessment Upper Extremity Assessment Upper Extremity Assessment: Overall WFL for tasks assessed   Lower Extremity Assessment Lower Extremity Assessment: Overall WFL for tasks assessed   Cervical / Trunk Assessment Cervical / Trunk Assessment: Normal   Communication Communication Communication: No difficulties   Cognition Arousal/Alertness: Awake/alert Behavior During Therapy: WFL for tasks assessed/performed Overall Cognitive Status: Within Functional Limits for tasks assessed                                 General Comments: A&Ox4   General Comments       Exercises     Shoulder Instructions      Home Living Family/patient expects to be discharged to:: Private residence Living Arrangements: Spouse/significant other;Children Available Help at Discharge: Family Type of Home: House Home Access: Stairs to enter Technical brewer of Steps: 1 Entrance Stairs-Rails: Right;Can reach both;Left Home Layout: One level     Bathroom Shower/Tub: Teacher, early years/pre: Standard     Home Equipment: None  Prior Functioning/Environment Level of Independence: Independent        Comments: Unemployed, drives, enjoys time with her 2 dogs.        OT Problem List:        OT Treatment/Interventions:      OT Goals(Current goals can be found in the care plan section) Acute Rehab OT Goals Patient Stated Goal: To better control her diabetes OT Goal Formulation: With patient Time For Goal Achievement: 08/18/20 Potential to Achieve Goals:  (defer to MD)  OT Frequency:     Barriers to D/C:            Co-evaluation              AM-PAC OT "6 Clicks" Daily Activity     Outcome Measure Help from another person eating meals?: None Help from another person taking care of personal grooming?: None Help from another person  toileting, which includes using toliet, bedpan, or urinal?: None Help from another person bathing (including washing, rinsing, drying)?: None Help from another person to put on and taking off regular upper body clothing?: None Help from another person to put on and taking off regular lower body clothing?: None 6 Click Score: 24   End of Session Equipment Utilized During Treatment: Gait belt Nurse Communication: Mobility status  Activity Tolerance: Patient tolerated treatment well Patient left: with nursing/sitter in room;with call bell/phone within reach (Sitting EOB with Nursing present.)                   Time: 9532-0233 OT Time Calculation (min): 20 min Charges:  OT General Charges $OT Visit: 1 Visit OT Evaluation $OT Eval Low Complexity: 1 Low  Tanny Harnack, OT Acute Rehab Services Office: [phone] 08/18/2020   Julien Girt 08/18/2020, 1:08 PM

## 2020-08-18 NOTE — Progress Notes (Signed)
Inpatient Diabetes Program Recommendations  AACE/ADA: New Consensus Statement on Inpatient Glycemic Control (2015)  Target Ranges:  Prepandial:   less than 140 mg/dL      Peak postprandial:   less than 180 mg/dL (1-2 hours)      Critically ill patients:  140 - 180 mg/dL   Lab Results  Component Value Date   GLUCAP 154 (H) 08/18/2020   HGBA1C 13.3 (H) 08/17/2020    Review of Glycemic Control   Current orders for Inpatient glycemic control: Lantus 16 units Q24H, Novolog 0-6 units Q4H  HgbA1C - 13.3%   Spoke with patient about diabetes and home regimen for diabetes control. Patient reports being followed by PCP for diabetes management. Discussed HgbA1C of 13.3% and importance of obtaining tighter control with blood sugars. Pt states she "sometimes forgets" to take her insulin. Checks blood sugars occasionally and admits to not making diabetes control important. Discussed setting phone for alarm to check blood sugars and take her insulin. She agreed to this. Does not have problems in getting insulin or supplies. We discussed hypoglycemia s/s and treatment and complications from undiagnosed DM. Pt states she knows she needs to get back on track and will try her best. Not interested in OP Diabetes Education at this point.   Will follow.  Thank you. Lorenda Peck, RD, LDN, CDE Inpatient Diabetes Coordinator [phone]

## 2020-08-18 NOTE — Progress Notes (Signed)
Chaplain met patient and asked if she would like prayer.  Had prayer with Patient as she is looking to be discharged soon.  S , Chaplain   08/18/20 1100  Clinical Encounter Type  Visited With Patient  Visit Type Initial;Spiritual support  Spiritual Encounters  Spiritual Needs Prayer  Stress Factors  Patient Stress Factors None identified   

## 2020-08-18 NOTE — Progress Notes (Signed)
PROGRESS NOTE    [name] [name]  CZY:[id number] DOB: Feb 25, 1961 DOA: 08/16/2020 PCP: Jinny Sanders, MD  Brief Narrative:   59 year old community dwelling white Fem Poorly controlled DM TY 2 with multiple admissions for DKA Prior admission 05/30/2019 with possible upper GI bleed-enteroscopy = grade D esophagitis + D2 ulcers 10 mm normal stomach proximal jejunum-gastric emptying study negative for 2018  H. pylori Rx 2018 Schatzki ring diverticular disease IBS, hypothyroid, A. fib HFrEF Recent steroid burst + muscle relaxants + cervical collar 08/09/2020 at PCP office for acute right shoulder pain?  Cervical radiculopathy-at that office visit long-acting insulin was increased at the time Came to emergency room with nausea vomiting and DKA anion gap 32 ketones 7 Initially consulted upon by critical care but then transferred to hospitalist as stabilized GI consulted-performed endoscopy on 9/8 without any specific bleeding vessel noted DKA physiology resolved therefore patient was transferred to regular floor 9/8     Assessment & Plan:   Principal Problem:   DKA (diabetic ketoacidoses) ([place]) Active Problems:   Hyperlipidemia   Essential hypertension   Paroxysmal A-fib ([place])   Gastroparesis due to DM ([place])   Chronic systolic CHF (congestive heart failure) ([place])   MDD (major depressive disorder), single episode, mild ([name])   Acute esophagitis   Gastric erosion   1. DKA secondary to prednisone taper-problem resolved on 9/8 2. 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 Not compliant with p.o. Reglan but reinstituted and will need this on discharge b. Lowered long-acting insulin to 16 units-sugars well controlled c. ? ACE,? ARB in outpatient setting 3. Hematemesis with gastroparesis a. No bleeding ulcer no stigmata of the same on EGD performed 9/8 b.  PTA on prednisone, ibuprofen, which have both been held secondary to high risk of  worsening bleed c. EGD showed grade D esophagitis with erosive gastropathy d. PPI X 12 weeks 6 Carafate X 14 days 3 times daily 4. Severe hypokalemia 2.8  a. Magnesium very low so replace with 2 g IV b. Stop IV fluid with K, give oral replacement only c. Recheck both magnesium and potassium in the morning 5. Leukocytosis a. Likely from stress of vomiting versus steroids b. No focal source of infection so hold antibiotics at this time 6. atrial fibrillation CHADS2 score >2 not on anticoagulation a. Likely not on anticoagulation secondary to acute GI bleeds in the past 7. Treated H. pylori 2018 8. HFrEF EF 30-35% with improvement-->60% 2020 a. Symptomatic management continue metoprolol p.o. 25 mg 9. Right shoulder pain a. See above i. Continue Flexeril 10 3 times daily for spasm may use prior to admission Norco ii. May benefit from occupational therapy/chiropractic/acupuncture-OT eval pending for today 10. Hyperlipidemia a. Hold cholesterol meds until discharge 11. Hypothyroidism a. Continue Synthroid 175 daily p.o. 12. HTN a. Resume metoprolol XL 25 if continues to be elevated   DVT prophylaxis: SCD Code Status: Full code Family Communication: None Disposition:   Status is: Inpatient  Remains inpatient appropriate because:Hemodynamically unstable and Persistent severe electrolyte disturbances   Dispo: The patient is from: Home              Anticipated d/c is to: Home              Anticipated d/c date is: 2 days              Patient currently is not medically stable to d/c.       Consultants:   GI  Critical care  Procedures: Upper endoscopy 9/8  Antimicrobials: None currently   Subjective:  Reporting nausea at this point this morning and discomfort in the abdomen requesting Tylenol Tolerating broth fine Able to keep it down without nausea however still feels somewhat weak and has not challenged herself to a regular diet yet  Objective: Vitals:   08/17/20  1800 08/17/20 2003 08/17/20 2257 08/18/20 0311  BP: (!) 150/92 112/86 (!) 118/91 123/83  Pulse: 100 95 92 88  Resp: 16 16 14 16   Temp: 98.2 F (36.8 C) 98.7 F (37.1 C) 98.4 F (36.9 C) 98.7 F (37.1 C)  TempSrc: Oral Oral Oral Oral  SpO2: 100% 99% 98% 98%    Intake/Output Summary (Last 24 hours) at 08/18/2020 0724 Last data filed at 08/18/2020 0523 Gross per 24 hour  Intake 1993.07 ml  Output 1350 ml  Net 643.07 ml   There were no vitals filed for this visit.  Examination:  Awake pleasant coherent Chest clear no added sound no rales no rhonchi Abdomen slightly fairly tender in epigastrium Mentation intact Neurologically intact No focal deficit S1-S2 no murmur rub or gallop gallop on monitor sinus rhythm  Data Reviewed: I have personally reviewed following labs and imaging studies  Hemoglobin down from 17.9-16.3 White count down from 26.5-12.7 Potassium up from 2.9-3.4 Magnesium 1.3 BUNs/creatinine 6/0.44   Radiology Studies: No results found.   Scheduled Meds: . insulin aspart  0-6 Units Subcutaneous Q4H  . insulin glargine  16 Units Subcutaneous Q24H  . levothyroxine  175 mcg Oral Q0600  . metoCLOPramide  5 mg Oral TID AC & HS  . metoprolol succinate  25 mg Oral Daily  . ondansetron (ZOFRAN) IV  4 mg Intravenous Q4H  . pantoprazole  40 mg Oral BID AC  . potassium chloride  40 mEq Oral BID  . sucralfate  1 g Oral TID WC & HS   Continuous Infusions:    LOS: 2 days    Time spent: [place], MD [facility] To contact the attending provider between 7A-7P or the covering provider during after hours 7P-7A, please log into the web site [email or web address] and access using universal  password for that web site. If you do not have the password, please call the hospital operator.  08/18/2020, 7:24 AM

## 2020-08-19 ENCOUNTER — Telehealth: Payer: Self-pay

## 2020-08-19 LAB — COMPREHENSIVE METABOLIC PANEL
ALT: 11 U/L (ref 0–44)
AST: 13 U/L — ABNORMAL LOW (ref 15–41)
Albumin: 3.4 g/dL — ABNORMAL LOW (ref 3.5–5.0)
Alkaline Phosphatase: 44 U/L (ref 38–126)
Anion gap: 14 (ref 5–15)
BUN: 7 mg/dL (ref 6–20)
CO2: 24 mmol/L (ref 22–32)
Calcium: 9 mg/dL (ref 8.9–10.3)
Chloride: 98 mmol/L (ref 98–111)
Creatinine, Ser: 0.47 mg/dL (ref 0.44–1.00)
GFR calc Af Amer: >60 mL/min (ref >60)
GFR calc non Af Amer: >60 mL/min (ref >60)
Glucose, Bld: 140 mg/dL — ABNORMAL HIGH (ref 70–99)
Potassium: 3.5 mmol/L (ref 3.5–5.1)
Sodium: 136 mmol/L (ref 135–145)
Total Bilirubin: 0.8 mg/dL (ref 0.3–1.2)
Total Protein: 6.3 g/dL — ABNORMAL LOW (ref 6.5–8.1)

## 2020-08-19 LAB — CBC WITH DIFFERENTIAL/PLATELET
Abs Immature Granulocytes: 0.05 10*3/uL (ref 0.00–0.07)
Basophils Absolute: 0 10*3/uL (ref 0.0–0.1)
Basophils Relative: 0 %
Eosinophils Absolute: 0.1 10*3/uL (ref 0.0–0.5)
Eosinophils Relative: 1 %
HCT: 46.9 % — ABNORMAL HIGH (ref 36.0–46.0)
Hemoglobin: 16 g/dL — ABNORMAL HIGH (ref 12.0–15.0)
Immature Granulocytes: 1 %
Lymphocytes Relative: 32 %
Lymphs Abs: 3.2 10*3/uL (ref 0.7–4.0)
MCH: 29.7 pg (ref 26.0–34.0)
MCHC: 34.1 g/dL (ref 30.0–36.0)
MCV: 87.2 fL (ref 80.0–100.0)
Monocytes Absolute: 0.8 10*3/uL (ref 0.1–1.0)
Monocytes Relative: 8 %
Neutro Abs: 5.9 10*3/uL (ref 1.7–7.7)
Neutrophils Relative %: 58 %
Platelets: 313 10*3/uL (ref 150–400)
RBC: 5.38 MIL/uL — ABNORMAL HIGH (ref 3.87–5.11)
RDW: 13.4 % (ref 11.5–15.5)
WBC: 10 10*3/uL (ref 4.0–10.5)
nRBC: 0 % (ref 0.0–0.2)

## 2020-08-19 LAB — MAGNESIUM: Magnesium: 1.8 mg/dL (ref 1.7–2.4)

## 2020-08-19 LAB — GLUCOSE, CAPILLARY
Glucose-Capillary: 129 mg/dL — ABNORMAL HIGH (ref 70–99)
Glucose-Capillary: 72 mg/dL (ref 70–99)
Glucose-Capillary: 81 mg/dL (ref 70–99)

## 2020-08-19 MED ORDER — PANTOPRAZOLE SODIUM 40 MG PO TBEC
40.0000 mg | DELAYED_RELEASE_TABLET | Freq: Two times a day (BID) | ORAL | 3 refills | Status: DC
Start: 2020-08-19 — End: 2021-11-23

## 2020-08-19 MED ORDER — METOCLOPRAMIDE HCL 5 MG PO TABS: ORAL_TABLET | ORAL | 11 refills | Status: DC

## 2020-08-19 MED ORDER — TOUJEO SOLOSTAR 300 UNIT/ML ~~LOC~~ SOPN: 16.0000 [IU] | PEN_INJECTOR | Freq: Every day | SUBCUTANEOUS | Status: DC

## 2020-08-19 MED ORDER — SUCRALFATE 1 G PO TABS: 1.0000 g | ORAL_TABLET | Freq: Four times a day (QID) | ORAL | 0 refills | Status: DC

## 2020-08-19 MED ORDER — CYCLOBENZAPRINE HCL 10 MG PO TABS: 10.0000 mg | ORAL_TABLET | Freq: Every evening | ORAL | 0 refills | Status: AC | PRN

## 2020-08-19 MED ORDER — HYDROCODONE-ACETAMINOPHEN 5-325 MG PO TABS
1.0000 | ORAL_TABLET | Freq: Three times a day (TID) | ORAL | 0 refills | Status: AC | PRN
Start: 2020-08-19 — End: 2020-08-22

## 2020-08-19 NOTE — Telephone Encounter (Signed)
Transition Care Management Follow-up Telephone Call  Date of discharge and from where: 08/19/2020, [facility]  How have you been since you were released from the hospital? Patient states she is doing fine.   Any questions or concerns? No  Items Reviewed:  Did the pt receive and understand the discharge instructions provided? Yes   Medications obtained and verified? Yes   Any new allergies since your discharge? No   Dietary orders reviewed? Yes  Do you have support at home? Yes   Functional Questionnaire: (I = Independent and D = Dependent) ADLs: I  Bathing/Dressing- I  Meal Prep- I  Eating- I  Maintaining continence- I  Transferring/Ambulation- I  Managing Meds- I  Follow up appointments reviewed:   PCP Hospital f/u appt confirmed? Yes  Scheduled to see Dr. Diona Browner on 08/23/2020 @ 12 pm.  [facility] f/u appt confirmed? N/A   Are transportation arrangements needed? No   If their condition worsens, is the pt aware to call PCP or go to the Emergency Dept.? Yes  Was the patient provided with contact information for the PCP's office or ED? Yes  Was to pt encouraged to call back with questions or concerns? Yes

## 2020-08-19 NOTE — Discharge Summary (Signed)
Physician Discharge Summary  [name] [name] RAQ:[id number] DOB: 10-Nov-1961 DOA: 08/16/2020  PCP: Jinny Sanders, MD  Admit date: 08/16/2020 Discharge date: 08/19/2020  Time spent: 22 minutes  Recommendations for Outpatient Follow-up:  1. Patient being referred to outpatient occupational therapy-if this does not work out suggest trial of either chiropractor E osteopathic manipulation with acupuncture for cervicalgia as none pharmacological management may be better in her case 2. Recommend Chem-12, CBC 1 week as was hypokalemic this admission and hypomagnesemic 3. I have refilled very limited amount of muscle relaxant and controlled substance opioids on discharge-patient is aware to follow-up with PCP for further recommendations 4. Get TSH in about 3 weeks as an outpatient  Discharge Diagnoses:  Principal Problem:   DKA (diabetic ketoacidoses) ([place]) Active Problems:   Hyperlipidemia   Essential hypertension   Paroxysmal A-fib ([name])   Gastroparesis due to DM ([name])   Chronic systolic CHF (congestive heart failure) ([name])   MDD (major depressive disorder), single episode, mild ([name])   Acute esophagitis   Gastric erosion   Discharge Condition: Improved  Diet recommendation: Diabetic  Filed Weights   08/19/20 0435  Weight: 48.4 kg    History of present illness:  59 year old community dwelling white Fem Poorly controlled DM TY 2 with multiple admissions for DKA Prior admission 05/30/2019 with possible upper GI bleed-enteroscopy = grade D esophagitis + D2 ulcers 10 mm normal stomach proximal jejunum-gastric emptying study negative for 2018             H. pylori Rx 2018 Schatzki ring diverticular disease IBS, hypothyroid, A. fib HFrEF Recent steroid burst + muscle relaxants + cervical collar 08/09/2020 at PCP office for acute right shoulder pain?  Cervical radiculopathy-at that office visit long-acting insulin was increased at the time Came to emergency room with nausea vomiting and DKA  anion gap 32 ketones 7 Initially consulted upon by critical care but then transferred to hospitalist as stabilized GI consulted-performed endoscopy on 9/8 without any specific bleeding vessel noted DKA physiology resolved therefore patient was transferred to regular floor 9/8 [facility] Course:  1. DKA secondary to prednisone taper-problem resolved on 9/8 2. 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 Not compliant with p.o. Reglan but reinstituted and will need this on discharge b. Lowered long-acting insulin to 16 units-sugars well controlled c. ? ACE,? ARB in outpatient setting as per primary physician 3. Hematemesis with gastroparesis a. No bleeding ulcer no stigmata of the same on EGD performed 9/8 b.  PTA on prednisone, ibuprofen-do not use on discharge c. EGD showed grade D esophagitis with erosive gastropathy d. PPI X 12 weeks 6 Carafate X 14 days 3 times daily-->Rx called in in the outpatient setting 4. Severe hypokalemia 2.8  a. Resolved on discharge b. No prescriptions on discharge will need recheck in a week 5. Leukocytosis a. Likely from stress of vomiting versus steroids b. No focal source of infection so hold antibiotics at this time 6. atrial fibrillation CHADS2 score >2 not on anticoagulation a. Likely not on anticoagulation secondary to acute GI bleeds in the past 7. Treated H. pylori 2018 8. HFrEF EF 30-35% with improvement-->60% 2020 a. Symptomatic management continue metoprolol p.o. 25 mg 9. Right shoulder pain a. See above i. Continue Flexeril 10 3 times daily for spasm may use prior to admission Norco ii. May benefit from occupational therapy/chiropractic/acupuncture-OT eval pending for today iii. Have spoken with occupational therapy and they are recommending outpatient 10. Hyperlipidemia a.  Hold cholesterol meds until discharge 11. Hypothyroidism a. Continue Synthroid 175 daily  p.o. 12. HTN a. Resume metoprolol XL 25 if continues to be elevated  Procedures:  EGD done as per above (i.e. Studies not automatically included, echos, thoracentesis, etc; not x-rays)  Consultations:  Gastroenterology  Discharge Exam: Vitals:   08/18/20 2100 08/19/20 0421  BP: 100/84 112/80  Pulse: 90 88  Resp: 18 16  Temp: 97.7 F (36.5 C) 98.1 F (36.7 C)  SpO2: 98% 99%    General: Awake alert coherent no distress some pain in shoulder able to however raise her right arm above her head has point tenderness in the trapezius Cardiovascular: S1-S2 no murmur rub or gallop Respiratory: Clinically clear no added sound no rales no rhonchi Abdomen soft no rebound no guarding  Discharge Instructions    Allergies as of 08/19/2020      Reactions   Codeine Hives, Anxiety, Other (See Comments)      Medication List    STOP taking these medications   ibuprofen 200 MG tablet Commonly known as: ADVIL   predniSONE 10 MG tablet Commonly known as: DELTASONE   Tresiba FlexTouch 100 UNIT/ML FlexTouch Pen Generic drug: insulin degludec     TAKE these medications   cyclobenzaprine 10 MG tablet Commonly known as: FLEXERIL Take 1 tablet (10 mg total) by mouth at bedtime as needed for up to 15 days for muscle spasms.   fexofenadine 180 MG tablet Commonly known as: ALLEGRA Take 1 tablet (180 mg total) by mouth daily.   FreeStyle Libre 14 Day Sensor Misc by Does not apply route.   HYDROcodone-acetaminophen 5-325 MG tablet Commonly known as: NORCO/VICODIN Take 1 tablet by mouth every 8 (eight) hours as needed for up to 3 days for moderate pain.   insulin lispro 100 UNIT/ML KiwkPen Commonly known as: HumaLOG KwikPen Inject into skin 3 times a day at meal time according to the following sliding scale: CBG 70-120 - 0 units / CBG 121-150 - 1 unit / CBG 151-200 - 2 units / CBG 201-250 - 3 units / CBG 251-300 - 5 units / CBG 301-350 - 7 units / CBG 351-400+ - 9 units  YOU  MUST FOLLOW A STRICT DIABETIC DIET What changed:   how to take this  when to take this  additional instructions   levothyroxine 175 MCG tablet Commonly known as: SYNTHROID Take 175 mcg by mouth daily.   metoCLOPramide 5 MG tablet Commonly known as: Reglan Take 5mg  before breakfast lunch and dinner and 10mg  at bedtime   metoprolol succinate 25 MG 24 hr tablet Commonly known as: TOPROL-XL Take 1 tablet (25 mg total) by mouth daily.   onetouch ultrasoft lancets Use to check blood sugar daily as needed.  Dx: E11.10   OneTouch Verio test strip Generic drug: glucose blood USE TO CHECK BLOOD SUGAR DAILY AS NEEDED. DX: E11.10   pantoprazole 40 MG tablet Commonly known as: PROTONIX Take 1 tablet (40 mg total) by mouth 2 (two) times daily before a meal.   Pen Needles 31G X 8 MM Misc Use to inject insulin 4 times a day.  Dx: E11.10   promethazine 25 MG tablet Commonly known as: PHENERGAN Take 1 TABLET BY MOUTH EVERY 8 HOUR AS NEEDED FOR NAUSEA AND VOMITINB. What changed:   how much to take  how to take this  when to take this  reasons to take this   rosuvastatin 40 MG tablet Commonly known as: CRESTOR TAKE 1 TABLET BY MOUTH  EVERY DAY   sertraline 100 MG tablet Commonly known as: ZOLOFT TAKE 1 TABLET BY MOUTH EVERY DAY   sucralfate 1 g tablet Commonly known as: Carafate Take 1 tablet (1 g total) by mouth 4 (four) times daily for 14 days.   Toujeo SoloStar 300 UNIT/ML Solostar Pen Generic drug: insulin glargine (1 Unit Dial) Inject 16 Units into the skin at bedtime. What changed: how much to take      Allergies  Allergen Reactions  . Codeine Hives, Anxiety and Other (See Comments)      The results of significant diagnostics from this hospitalization (including imaging, microbiology, ancillary and laboratory) are listed below for reference.    Significant Diagnostic Studies: DG Chest 2 View  Result Date: 08/15/2020 CLINICAL DATA:  Chest pain. EXAM:  CHEST - 2 VIEW COMPARISON:  May 30, 2019. FINDINGS: The heart size and mediastinal contours are within normal limits. Both lungs are clear. No pneumothorax or pleural effusion is noted. The visualized skeletal structures are unremarkable. IMPRESSION: No active cardiopulmonary disease. Electronically Signed   By: Marijo Conception M.D.   On: 08/15/2020 14:41   CT ABDOMEN PELVIS W CONTRAST  Result Date: 08/16/2020 CLINICAL DATA:  Abdominal pain. Patient complains of left upper quadrant pain. EXAM: CT ABDOMEN AND PELVIS WITH CONTRAST TECHNIQUE: Multidetector CT imaging of the abdomen and pelvis was performed using the standard protocol following bolus administration of intravenous contrast. CONTRAST:  144mL OMNIPAQUE IOHEXOL 300 MG/ML  SOLN COMPARISON:  CT of the abdomen and pelvis 05/31/2019. FINDINGS: Lower chest: The lung bases are clear without focal nodule, mass, or airspace disease. Heart size is normal. No significant pleural or pericardial effusion is present. Hepatobiliary: No focal liver abnormality is seen. No gallstones, gallbladder wall thickening, or biliary dilatation. 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 Pancreas: Unremarkable. No pancreatic ductal dilatation or surrounding inflammatory changes. Spleen: Normal in size without focal abnormality. Adrenals/Urinary Tract: Adrenal glands are unremarkable. Kidneys are normal, without renal calculi, focal lesion, or hydronephrosis. Bladder is moderately distended. No obstructing lesion is present. Stomach/Bowel: Circumferential thickening is present in the distal esophagus. The stomach is markedly distended. No obstructing lesion is present. Proximal duodenum is unremarkable. Similar appearance was present previously. The small bowel is within normal limits. Terminal ileum is normal. The appendix is visualized and normal. The ascending and transverse colon are within normal limits. Descending and sigmoid colon are mostly  collapsed, within normal limits. Vascular/Lymphatic: No significant vascular findings are present. No enlarged abdominal or pelvic lymph nodes. Reproductive: Status post hysterectomy. No adnexal masses. Other: No abdominal wall hernia or abnormality. No abdominopelvic ascites. Musculoskeletal: No acute or significant osseous findings. IMPRESSION: 1. Circumferential thickening in the distal esophagus compatible with esophagitis. 2. Marked distention of the stomach without an obstructing lesion. Question gastroparesis. Given similar appearance of gastric distension with previous duodenal distension, SMA syndrome is also considered. 3. Status post hysterectomy. Electronically Signed   By: San Morelle M.D.   On: 08/16/2020 05:30   DG Abd Acute W/Chest  Result Date: 08/16/2020 CLINICAL DATA:  Mid abdominal pain and nausea for 48 hours EXAM: DG ABDOMEN ACUTE W/ 1V CHEST COMPARISON:  Abdomen 06/01/2019 FINDINGS: Normal heart size and pulmonary vascularity. Emphysematous changes in the lungs. No airspace disease or consolidation. No pleural effusions. No pneumothorax. Mediastinal contours appear intact. Scattered gas and stool throughout the colon with some gas-filled small bowel. No small or large bowel distention. No free intra-abdominal air. No abnormal air-fluid levels. No  radiopaque stones. Visualized bones and soft tissue contours appear intact. IMPRESSION: Emphysematous changes in the lungs. No evidence of active pulmonary disease. Nonobstructive bowel gas pattern. Electronically Signed   By: Lucienne Capers M.D.   On: 08/16/2020 04:23   MYOCARDIAL PERFUSION IMAGING  Result Date: 07/20/2020  Nuclear stress EF: 64%.  The left ventricular ejection fraction is normal (55-65%).  There was no ST segment deviation noted during stress.  The study is normal.  This is a low risk study.  No ischemia.  Inferior and apical hypokinesis. Suspect that this is artifact. Consider echo to assess wall motion.      Microbiology: Recent Results (from the past 240 hour(s))  SARS Coronavirus 2 by RT PCR (hospital order, performed in [facility] hospital lab) Nasopharyngeal Nasopharyngeal Swab     Status: None   Collection Time: 08/16/20  5:26 AM   Specimen: Nasopharyngeal Swab  Result Value Ref Range Status   SARS Coronavirus 2 NEGATIVE NEGATIVE Final    Comment: (NOTE) SARS-CoV-2 target nucleic acids are NOT DETECTED.  The SARS-CoV-2 RNA is generally detectable in upper and lower respiratory specimens during the acute phase of infection. The lowest concentration of SARS-CoV-2 viral copies this assay can detect is 250 copies / mL. A negative result does not preclude SARS-CoV-2 infection and should not be used as the sole basis for treatment or other patient management decisions.  A negative result may occur with improper specimen collection / handling, submission of specimen other than nasopharyngeal swab, presence of viral mutation(s) within the areas targeted by this assay, and inadequate number of viral copies (<250 copies / mL). A negative result must be combined with clinical observations, patient history, and epidemiological information.  Fact Sheet for Patients:   StrictlyIdeas.no  Fact Sheet for Healthcare Providers: BankingDealers.co.za  This test is not yet approved or  cleared by the [place] FDA and has been authorized for detection and/or diagnosis of SARS-CoV-2 by FDA under an Emergency Use Authorization (EUA).  This EUA will remain in effect (meaning this test can be used) for the duration of the COVID-19 declaration under Section 564(b)(1) of the Act, 21 U.S.C. section 360bbb-3(b)(1), unless the authorization is terminated or revoked sooner.  Performed at [facility], [place] [address], [place], [place] [address]      Labs: Basic Metabolic Panel: Recent Labs  Lab 08/16/20 1206 08/16/20 1422  08/17/20 0212 08/17/20 0423 08/17/20 1134 08/18/20 0540 08/19/20 0514  NA 137   < > 131* 134* 137 136 136  K 3.7   < > 3.1* 2.8* 2.9* 3.4* 3.5  CL 101   < > 101 101 101 102 98  CO2 24   < > 21* 23 25 22 24   GLUCOSE 207*   < > 282* 251* 149* 121* 140*  BUN 17   < > 5* 5* <5* 6 7  CREATININE 0.46   < > 0.34* 0.37* 0.36* 0.44 0.47  CALCIUM 8.4*   < > 8.4* 8.5* 8.6* 8.5* 9.0  MG 1.4*  --   --  1.4*  --  1.3* 1.8   < > = values in this interval not displayed.   Liver Function Tests: Recent Labs  Lab 08/16/20 0238 08/18/20 0540 08/19/20 0514  AST 16 12* 13*  ALT 12 11 11   ALKPHOS 55 49 44  BILITOT 1.4* 1.0 0.8  PROT 8.2* 6.4* 6.3*  ALBUMIN 4.8 3.5 3.4*   Recent Labs  Lab 08/16/20 0238  LIPASE 21  No results for input(s): AMMONIA in the last 168 hours. CBC: Recent Labs  Lab 08/15/20 1404 08/16/20 0238 08/17/20 0423 08/18/20 0540 08/19/20 0514  WBC 12.0* 26.5* 18.9* 12.7* 10.0  NEUTROABS  --   --   --  7.9* 5.9  HGB 17.9* 18.8* 14.9 16.3* 16.0*  HCT 52.3* 55.8* 42.9 46.3* 46.9*  MCV 87.6 88.3 86.0 84.2 87.2  PLT 399 439* 301 285 313   Cardiac Enzymes: No results for input(s): CKTOTAL, CKMB, CKMBINDEX, TROPONINI in the last 168 hours. BNP: BNP (last 3 results) No results for input(s): BNP in the last 8760 hours.  ProBNP (last 3 results) No results for input(s): PROBNP in the last 8760 hours.  CBG: Recent Labs  Lab 08/18/20 1609 08/18/20 2054 08/19/20 0025 08/19/20 0424 08/19/20 0729  GLUCAP 130* 104* 72 81 129*       Signed:  Nita Sells MD   [facility] 08/19/2020, 8:41 AM

## 2020-08-19 NOTE — Progress Notes (Signed)
Patient discharged home, husband picked patient up. AVS documentation complete. Explained medication changes and provided information about her follow-up appointment. Discharge teaching complete and patient verbalized understanding and stated that she did not have any questions. IV's removed, both were clean, dry, and intact. Patient left in stable condition and without distress.

## 2020-08-21 DIAGNOSIS — Z7189 Other specified counseling: Secondary | ICD-10-CM | POA: Insufficient documentation

## 2020-08-21 NOTE — Progress Notes (Signed)
Cardiology Office Note   Date:  08/22/2020   ID:  [name] [name], DOB 1961/10/29, MRN [id number]  PCP:  [name] Sanders, MD  Cardiologist:   Minus Breeding, MD   Chief Complaint  Patient presents with  . Hypotension      History of Present Illness: [name] [name] is a 59 y.o. female who presents for follow up of a non ischemic cardiomyopathy.   She had normal coronaries on catheterization in 2012.  Repeat echocardiogram in 2018 revealed an EF of 35 to 40%.  Repeat cardiac catheterization demonstrated normal coronaries.  She was noted to have very poorly controlled diabetes.  She has been hospitalized on several occasions due to DKA.  (Most recently last week.  I reviewed these records for this visit.)   She has a history of medical noncompliance.  In July she was seen to follow up hypotension.    She had chest pain but a low risk perfusion study.  It is likely that her pain was related to her esophagitis and ulcers.  This was treated in the hospital last week.  She actually feels better from the standpoint.  Her biggest complaint now is dizziness when she sits standing up.  She is describing orthostatic symptoms.  Her blood pressure seems to be dropping.  She is having resting rapid heart rates some of its irregular.  She has avoided passing out.  She is trying to stay hydrated.  She is not having any new shortness of breath, PND or orthopnea.  She has had about a 30 pound weight loss in the last 18 months.   Past Medical History:  Diagnosis Date  . Allergy    seasonal  . Anxiety   . B12 deficiency   . Chest pain 04/05/2017  . CHF (congestive heart failure) ([place])   . Diabetes mellitus type 1, uncontrolled ([place])    "dr. just changed me to type 1, uncontrolled" (11/26/2018)  . DKA (diabetic ketoacidoses) ([place]) 05/25/2018  . GERD (gastroesophageal reflux disease)   . Hyperlipidemia   . Hypertension    [name]  . Hypothyroidism   . IBS (irritable bowel syndrome)   . Intermittent  vertigo   . 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 Leg cramping    "@ night" (09/18/2013)  . Migraine    "once q couple months" (11/26/2018)  . Osteoporosis   . Peripheral neuropathy   . Umbilical hernia    unrepaired (09/18/2013)    Past Surgical History:  Procedure Laterality Date  . BIOPSY  06/03/2019   Procedure: BIOPSY;  Surgeon: Jackquline Denmark, MD;  Location: [facility];  Service: Endoscopy;;  . BIOPSY  08/17/2020   Procedure: BIOPSY;  Surgeon: Thornton Park, MD;  Location: [facility];  Service: Gastroenterology;;  . [place]  . ENTEROSCOPY N/A 06/03/2019   Procedure: ENTEROSCOPY;  Surgeon: Jackquline Denmark, MD;  Location: [facility];  Service: Endoscopy;  Laterality: N/A;  . ESOPHAGOGASTRODUODENOSCOPY (EGD) WITH PROPOFOL N/A 08/17/2020   Procedure: ESOPHAGOGASTRODUODENOSCOPY (EGD) WITH PROPOFOL;  Surgeon: Thornton Park, MD;  Location: [facility];  Service: Gastroenterology;  Laterality: N/A;  . LAPAROSCOPIC ASSISTED VAGINAL HYSTERECTOMY  09/23/2012   Procedure: LAPAROSCOPIC ASSISTED VAGINAL HYSTERECTOMY;  Surgeon: Linda Hedges, DO;  Location: [facility];  Service: Gynecology;  Laterality: N/A;  pull Dr Gregor Hams instrument  . LEFT HEART CATH AND CORONARY ANGIOGRAPHY N/A 02/11/2017   Procedure: Left Heart Cath and Coronary Angiography;  Surgeon: Lorretta Harp, MD;  Location: [facility];  Service: Cardiovascular;  Laterality: N/A;  . TUBAL LIGATION Bilateral 1992     Current Outpatient Medications  Medication Sig Dispense Refill  . Continuous Blood Gluc Sensor (FREESTYLE LIBRE 14 DAY SENSOR) MISC by Does not apply route.    . cyclobenzaprine (FLEXERIL) 10 MG tablet Take 1 tablet (10 mg total) by mouth at bedtime as needed for up to 15 days for muscle spasms. 15 tablet 0  . fexofenadine (ALLEGRA) 180 MG tablet Take 1 tablet (180 mg total) by mouth daily. 90 tablet 3  . HYDROcodone-acetaminophen (NORCO/VICODIN) 5-325 MG tablet  Take 1 tablet by mouth every 8 (eight) hours as needed for up to 3 days for moderate pain. 9 tablet 0  . insulin lispro (HUMALOG KWIKPEN) 100 UNIT/ML KiwkPen Inject into skin 3 times a day at meal time according to the following sliding scale: CBG 70-120 - 0 units / CBG 121-150 - 1 unit / CBG 151-200 - 2 units / CBG 201-250 - 3 units / CBG 251-300 - 5 units / CBG 301-350 - 7 units / CBG 351-400+ - 9 units  YOU MUST FOLLOW A STRICT DIABETIC DIET (Patient taking differently: Inject into the skin See admin instructions. 8 units plus sliding scale Inject into skin 3 times a day at meal time according to the following sliding scale: CBG 70-120 - 0 units / CBG 121-150 - 1 unit / CBG 151-200 - 2 units / CBG 201-250 - 3 units / CBG 251-300 - 5 units / CBG 301-350 - 7 units / CBG 351-400+ - 9 units  YOU MUST FOLLOW A STRICT DIABETIC DIET) 4 pen 11  . Insulin Pen Needle (PEN NEEDLES) 31G X 8 MM MISC Use to inject insulin 4 times a day.  Dx: E11.10 130 each 11  . Lancets (ONETOUCH ULTRASOFT) lancets Use to check blood sugar daily as needed.  Dx: E11.10 100 each 3  . levothyroxine (SYNTHROID) 175 MCG tablet Take 175 mcg by mouth daily.    . metoCLOPramide (REGLAN) 5 MG tablet Take 5mg  before breakfast lunch and dinner and 10mg  at bedtime 150 tablet 11  . metoprolol succinate (TOPROL-XL) 25 MG 24 hr tablet Take 1 tablet (25 mg total) by mouth daily. 90 tablet 2  . ONETOUCH VERIO test strip USE TO CHECK BLOOD SUGAR DAILY AS NEEDED. DX: E11.10 100 strip 3  . pantoprazole (PROTONIX) 40 MG tablet Take 1 tablet (40 mg total) by mouth 2 (two) times daily before a meal. 60 tablet 3  . promethazine (PHENERGAN) 25 MG tablet Take 1 TABLET BY MOUTH EVERY 8 HOUR AS NEEDED FOR NAUSEA AND VOMITINB. (Patient taking differently: Take 25 mg by mouth every 8 (eight) hours as needed for nausea or vomiting. Take 1 TABLET BY MOUTH EVERY 8 HOUR AS NEEDED FOR NAUSEA AND VOMITINB.) 30 tablet 1  . rosuvastatin (CRESTOR) 40  MG tablet TAKE 1 TABLET BY MOUTH EVERY DAY 90 tablet 3  . sertraline (ZOLOFT) 100 MG tablet TAKE 1 TABLET BY MOUTH EVERY DAY 90 tablet 1  . sucralfate (CARAFATE) 1 g tablet Take 1 tablet (1 g total) by mouth 4 (four) times daily for 14 days. 56 tablet 0  . TOUJEO SOLOSTAR 300 UNIT/ML Solostar Pen Inject 16 Units into the skin at bedtime.     No current facility-administered medications for this visit.    Allergies:   Codeine    ROS:  Please see the history of present illness.   Otherwise, review of systems  are positive for none.   All other systems are reviewed and negative.    PHYSICAL EXAM: VS:  Ht 4\' 11"  (1.499 m)   Wt 110 lb (49.9 kg)   LMP 08/11/2012   SpO2 99%   BMI 22.22 kg/m  , BMI Body mass index is 22.22 kg/m. GENERAL:  Frail appearing NECK:  No jugular venous distention, waveform within normal limits, carotid upstroke brisk and symmetric, no bruits, no thyromegaly LUNGS:  Clear to auscultation bilaterally CHEST:  Unremarkable HEART:  PMI not displaced or sustained,S1 and S2 within normal limits, no S3, no S4, no clicks, no rubs, no murmurs ABD:  Flat, positive bowel sounds normal in frequency in pitch, no bruits, no rebound, no guarding, no midline pulsatile mass, no hepatomegaly, no splenomegaly EXT:  2 plus pulses throughout, no edema, no cyanosis no clubbing    EKG:  EKG is not ordered today.    Recent Labs: 08/19/2020: ALT 11; BUN 7; Creatinine, Ser 0.47; Hemoglobin 16.0; Magnesium 1.8; Platelets 313; Potassium 3.5; Sodium 136    Lipid Panel    Component Value Date/Time   CHOL 197 01/27/2019 0809   TRIG 148.0 01/27/2019 0809   HDL 43.10 01/27/2019 0809   CHOLHDL 5 01/27/2019 0809   VLDL 29.6 01/27/2019 0809   LDLCALC 124 (H) 01/27/2019 0809   LDLDIRECT 206.0 07/27/2014 0745      Wt Readings from Last 3 Encounters:  08/22/20 110 lb (49.9 kg)  08/19/20 106 lb 11.2 oz (48.4 kg)  08/09/20 114 lb (51.7 kg)      Other studies Reviewed: Additional  studies/ records that were reviewed today include:   Hospital records.  . Review of the above records demonstrates:  Please see elsewhere in the note.     ASSESSMENT AND PLAN:  Hypotension:    She has orthostatic hypotension symptoms.  I am going to suggest compression stockings.  We talked about hydration and conservative measures including moving slowly.   To evaluate her heart rate I am going to check a 3-day Zio patch.  If she has continued worsening with these conservative measures she might need midodrine.    Ongoing chest pressure:   She had a negative stress test recently.  This likely seems to be related to the GI problems she was having as mentioned above.   Insulin-dependent diabetes:    A1c is 13.3.  I will defer to [name] Sanders, MD followed by PCP.  Hyperlipidemia:   LDL was most recently 124.  I am not sure of her medication compliance.  This was stressed today and she should have a repeat LDL as the last results are over a year old.    Covid education:   He has been vaccinated.  Current medicines are reviewed at length with the patient today.  The patient does not have concerns regarding medicines.  The following changes have been made:  no change  Labs/ tests ordered today include:   Orders Placed This Encounter  Procedures  . LONG TERM MONITOR (3-14 DAYS)     Disposition:   FU with APP in 3 months.     Signed, Minus Breeding, MD  08/22/2020 8:58 AM    [place]

## 2020-08-22 ENCOUNTER — Ambulatory Visit: Payer: 59 | Admitting: Cardiology

## 2020-08-22 ENCOUNTER — Other Ambulatory Visit: Payer: Self-pay

## 2020-08-22 ENCOUNTER — Encounter: Payer: Self-pay | Admitting: *Deleted

## 2020-08-22 ENCOUNTER — Encounter: Payer: Self-pay | Admitting: Cardiology

## 2020-08-22 ENCOUNTER — Encounter: Payer: Self-pay | Admitting: Gastroenterology

## 2020-08-22 VITALS — Ht 59.0 in | Wt 110.0 lb

## 2020-08-22 DIAGNOSIS — I5022 Chronic systolic (congestive) heart failure: Secondary | ICD-10-CM

## 2020-08-22 DIAGNOSIS — E785 Hyperlipidemia, unspecified: Secondary | ICD-10-CM | POA: Diagnosis not present

## 2020-08-22 DIAGNOSIS — E118 Type 2 diabetes mellitus with unspecified complications: Secondary | ICD-10-CM | POA: Diagnosis not present

## 2020-08-22 DIAGNOSIS — Z794 Long term (current) use of insulin: Secondary | ICD-10-CM

## 2020-08-22 DIAGNOSIS — I959 Hypotension, unspecified: Secondary | ICD-10-CM

## 2020-08-22 DIAGNOSIS — Z7189 Other specified counseling: Secondary | ICD-10-CM

## 2020-08-22 DIAGNOSIS — I480 Paroxysmal atrial fibrillation: Secondary | ICD-10-CM

## 2020-08-22 NOTE — Progress Notes (Signed)
Patient ID: [name] [name], female   DOB: November 15, 1961, 59 y.o.   MRN: [id number] Patient enrolled for [employer] to ship a 3 day ZIO XT long term holter monitor to her home.

## 2020-08-22 NOTE — Patient Instructions (Addendum)
Medication Instructions:  No changes *If you need a refill on your cardiac medications before your next appointment, please call your pharmacy*   Lab Work:  Not needed  Testing/Procedures: Your physician has recommended that you wear a 3 DAY ZIO-PATCH monitor. The Zio patch cardiac monitor continuously records heart rhythm data for up to 14 days, this is for patients being evaluated for multiple types heart rhythms. For the first 24 hours post application, please avoid getting the Zio monitor wet in the shower or by excessive sweating during exercise. After that, feel free to carry on with regular activities. Keep soaps and lotions away from the ZIO XT Patch.  This will be mailed to you, please expect 7-10 days to receive.             Follow-Up: At [facility], you and your health needs are our priority.  As part of our continuing mission to provide you with exceptional heart care, we have created designated Provider Care Teams.  These Care Teams include your primary Cardiologist (physician) and Advanced Practice Providers (APPs -  Physician Assistants and Nurse Practitioners) who all work together to provide you with the care you need, when you need it.    Your next appointment:   3 month(s)  The format for your next appointment:   In Person  Provider:   You may see  one of the following Advanced Practice Providers on your designated Care Team:    Rosaria Ferries, PA-C  Jory Sims, DNP, ANP  Cadence Kathlen Mody, PA-C    Other Instructions  purchase compression socks or hose - knee -hi   strength  15- 20 mmHg wear during the day the take off at night  can purchase at [employer] like [place] , [employer] , local pharmacy , or [facility] in [place]  keep hydrate- 8-10 glasses 8 oz water daily    ZIO XT- Long Term Monitor Instructions   Your physician has requested you wear your ZIO patch monitor___3____days.   This is a single patch monitor.  [employer] supplies  one patch monitor per enrollment.  Additional stickers are not available.   Please do not apply patch if you will be having a Nuclear Stress Test, Echocardiogram, Cardiac CT, MRI, or Chest Xray during the time frame you would be wearing the monitor. The patch cannot be worn during these tests.  You cannot remove and re-apply the ZIO XT patch monitor.   Your ZIO patch monitor will be sent USPS Priority mail from [facility] directly to your home address. The monitor may also be mailed to a PO BOX if home delivery is not available.   It may take 3-5 days to receive your monitor after you have been enrolled.   Once you have received you monitor, please review enclosed instructions.  Your monitor has already been registered assigning a specific monitor serial # to you.   Applying the monitor   Shave hair from upper left chest.   Hold abrader disc by orange tab.  Rub abrader in 40 strokes over left upper chest as indicated in your monitor instructions.   Clean area with 4 enclosed alcohol pads .  Use all pads to assure are is cleaned thoroughly.  Let dry.   Apply patch as indicated in monitor instructions.  Patch will be place under collarbone on left side of chest with arrow pointing upward.   Rub patch adhesive wings for 2 minutes.Remove white label marked "1".  Remove white label marked "  2".  Rub patch adhesive wings for 2 additional minutes.   While looking in a mirror, press and release button in center of patch.  A small green light will flash 3-4 times .  This will be your only indicator the monitor has been turned on.     Do not shower for the first 24 hours.  You may shower after the first 24 hours.   Press button if you feel a symptom. You will hear a small click.  Record Date, Time and Symptom in the Patient Log Book.   When you are ready to remove patch, follow instructions on last 2 pages of Patient Log Book.  Stick patch monitor onto last page of Patient Log Book.     Place Patient Log Book in Time box.  Use locking tab on box and tape box closed securely.  The [employer] has [employer] on it.  Please place in mailbox as soon as possible.  Your physician should have your test results approximately 7 days after the monitor has been mailed back to [facility].   Call [place] at [phone] if you have questions regarding your ZIO XT patch monitor.  Call them immediately if you see an orange light blinking on your monitor.   If your monitor falls off in less than 4 days contact our Monitor department at [phone].  If your monitor becomes loose or falls off after 4 days call [employer] at [phone] for suggestions on securing your monitor.

## 2020-08-23 ENCOUNTER — Ambulatory Visit: Payer: 59 | Admitting: Family Medicine

## 2020-08-25 ENCOUNTER — Other Ambulatory Visit (INDEPENDENT_AMBULATORY_CARE_PROVIDER_SITE_OTHER): Payer: 59

## 2020-08-25 DIAGNOSIS — I959 Hypotension, unspecified: Secondary | ICD-10-CM | POA: Diagnosis not present

## 2020-08-25 DIAGNOSIS — I480 Paroxysmal atrial fibrillation: Secondary | ICD-10-CM

## 2020-08-25 DIAGNOSIS — I5022 Chronic systolic (congestive) heart failure: Secondary | ICD-10-CM | POA: Diagnosis not present

## 2020-09-16 ENCOUNTER — Encounter: Payer: Self-pay | Admitting: Family Medicine

## 2020-09-16 ENCOUNTER — Ambulatory Visit: Payer: 59 | Admitting: Family Medicine

## 2020-09-16 ENCOUNTER — Other Ambulatory Visit: Payer: Self-pay

## 2020-09-16 VITALS — BP 96/68 | HR 108 | Temp 97.4°F | Ht 59.0 in | Wt 111.0 lb

## 2020-09-16 DIAGNOSIS — Z23 Encounter for immunization: Secondary | ICD-10-CM

## 2020-09-16 DIAGNOSIS — E1149 Type 2 diabetes mellitus with other diabetic neurological complication: Secondary | ICD-10-CM | POA: Diagnosis not present

## 2020-09-16 DIAGNOSIS — R112 Nausea with vomiting, unspecified: Secondary | ICD-10-CM | POA: Diagnosis not present

## 2020-09-16 DIAGNOSIS — M25511 Pain in right shoulder: Secondary | ICD-10-CM

## 2020-09-16 DIAGNOSIS — E876 Hypokalemia: Secondary | ICD-10-CM

## 2020-09-16 LAB — BASIC METABOLIC PANEL
BUN: 14 mg/dL (ref 6–23)
CO2: 28 mEq/L (ref 19–32)
Calcium: 9.2 mg/dL (ref 8.4–10.5)
Chloride: 97 mEq/L (ref 96–112)
Creatinine, Ser: 0.8 mg/dL (ref 0.40–1.20)
GFR: 81.72 mL/min (ref >60.00)
Glucose, Bld: 398 mg/dL — ABNORMAL HIGH (ref 70–99)
Potassium: 4.1 mEq/L (ref 3.5–5.1)
Sodium: 135 mEq/L (ref 135–145)

## 2020-09-16 LAB — MAGNESIUM: Magnesium: 1.8 mg/dL (ref 1.5–2.5)

## 2020-09-16 NOTE — Patient Instructions (Addendum)
Work on low [employer], increase exercise. Continue pantoprazole.  Please stop at the lab to have labs drawn.  Do not take aspirin,or any ibuprofen/aleve.     Influenza (Flu) Vaccine (Inactivated or Recombinant): What You Need to Know 1. Why get vaccinated? Influenza vaccine can prevent influenza (flu). Flu is a contagious disease that spreads around the [place] every year, usually between October and May. Anyone can get the flu, but it is more dangerous for some people. Infants and young children, people 57 years of age and older, pregnant women, and people with certain health conditions or a weakened immune system are at greatest risk of flu complications. Pneumonia, bronchitis, sinus infections and ear infections are examples of flu-related complications. If you have a medical condition, such as heart disease, cancer or diabetes, flu can make it worse. Flu can cause fever and chills, sore throat, muscle aches, fatigue, cough, headache, and runny or stuffy nose. Some people may have vomiting and diarrhea, though this is more common in children than adults. Each year thousands of people in the [place] die from flu, and many more are hospitalized. Flu vaccine prevents millions of illnesses and flu-related visits to the doctor each year. 2. Influenza vaccine CDC recommends everyone 15 months of age and older get vaccinated every flu season. Children 6 months through 69 years of age may need 2 doses during a single flu season. Everyone else needs only 1 dose each flu season. It takes about 2 weeks for protection to develop after vaccination. There are many flu viruses, and they are always changing. Each year a new flu vaccine is made to protect against three or four viruses that are likely to cause disease in the upcoming flu season. Even when the vaccine doesn't exactly match these viruses, it may still provide some protection. Influenza vaccine does not cause flu. Influenza vaccine may  be given at the same time as other vaccines. 3. Talk with your health care provider Tell your vaccine provider if the person getting the vaccine:  Has had an allergic reaction after a previous dose of influenza vaccine, or has any severe, life-threatening allergies.  Has ever had Guillain-Barr Syndrome (also called GBS). In some cases, your health care provider may decide to postpone influenza vaccination to a future visit. People with minor illnesses, such as a cold, may be vaccinated. People who are moderately or severely ill should usually wait until they recover before getting influenza vaccine. Your health care provider can give you more information. 4. Risks of a vaccine reaction  Soreness, redness, and swelling where shot is given, fever, muscle aches, and headache can happen after influenza vaccine.  There may be a very small increased risk of Guillain-Barr Syndrome (GBS) after inactivated influenza vaccine (the flu shot). Young children who get the flu shot along with pneumococcal vaccine (PCV13), and/or DTaP vaccine at the same time might be slightly more likely to have a seizure caused by fever. Tell your health care provider if a child who is getting flu vaccine has ever had a seizure. People sometimes faint after medical procedures, including vaccination. Tell your provider if you feel dizzy or have vision changes or ringing in the ears. As with any medicine, there is a very remote chance of a vaccine causing a severe allergic reaction, other serious injury, or death. 5. What if there is a serious problem? An allergic reaction could occur after the vaccinated person leaves the clinic. If you see signs of a severe  allergic reaction (hives, swelling of the face and throat, difficulty breathing, a fast heartbeat, dizziness, or weakness), call 9-1-1 and get the person to the nearest hospital. For other signs that concern you, call your health care provider. Adverse reactions should be  reported to the Vaccine Adverse Event Reporting System (VAERS). Your health care provider will usually file this report, or you can do it yourself. Visit the VAERS website at www.vaers.SamedayNews.es or call (682) 099-4754.VAERS is only for reporting reactions, and VAERS staff do not give medical advice. 6. The National Vaccine Injury Compensation Program The [employer] Vaccine Injury Compensation Program (VICP) is a federal program that was created to compensate people who may have been injured by certain vaccines. Visit the VICP website at GoldCloset.com.ee or call 782-238-5234 to learn about the program and about filing a claim. There is a time limit to file a claim for compensation. 7. How can I learn more?  Ask your healthcare provider.  Call your local or state health department.  Contact the Centers for Disease Control and Prevention (CDC): ? Call 807-217-2774 (1-800-CDC-INFO) or ? Visit CDC's https://gibson.com/ Vaccine Information Statement (Interim) Inactivated Influenza Vaccine (07/24/2018) This information is not intended to replace advice given to you by your health care provider. Make sure you discuss any questions you have with your health care provider. Document Revised: 03/17/2019 Document Reviewed: 07/28/2018 Elsevier Patient Education  [place].

## 2020-09-16 NOTE — Progress Notes (Signed)
Chief Complaint  Patient presents with  . Hospitalization Follow-up    History of Present Illness: HPI   59 year old female presents for hospital follow up.  Admitted 08/16/2020 for diabetic ketoacidosis  1. Patient being referred to outpatient occupational therapy-if this does not work out suggest trial of either chiropractor E osteopathic manipulation with acupuncture for cervicalgia as none pharmacological management may be better in her case 2. Recommend Chem-12, CBC 1 week as was hypokalemic this admission and hypomagnesemic 3. I have refilled very limited amount of muscle relaxant and controlled substance opioids on discharge-patient is aware to follow-up with PCP for further recommendations 4. Get TSH in about 3 weeks as an outpatient   Hospital Course:  1. DKA secondary to prednisone taper-problem resolved on 9/8 2. 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 Not compliant with p.o. Reglan but reinstitutedand will need this on discharge b. Lowered long-acting insulin to16units-sugars well controlled c. ? ACE,? ARB in outpatient setting as per primary physician 3. Hematemesis with gastroparesis a. No bleeding ulcer no stigmata of the same on EGD performed 9/8 b. PTA on prednisone, ibuprofen-do not use on discharge c. EGD showed grade D esophagitis with erosive gastropathy d. PPI X 12 weeks 6 Carafate X 14 days 3 times daily-->Rx called in in the outpatient setting 4. Severe hypokalemia 2.8  a. Resolved on discharge b. No prescriptions on discharge will need recheck in a week 5. Leukocytosis a. Likely from stress of vomiting versus steroids b. No focal source of infection so hold antibiotics at this time 6. atrial fibrillation CHADS2 score >2 not on anticoagulation a. Likely not on anticoagulation secondary to acute GI bleeds in the past 7. Treated H. pylori 2018 8. HFrEF EF 30-35% with improvement-->60%  2020 a. Symptomatic management continue metoprolol p.o. 25 mg 9. Right shoulder pain a. See above i. Continue Flexeril 10 3 times daily for spasm may use prior to admission Norco ii. May benefit from occupational therapy/chiropractic/acupuncture-OT eval pending for today iii. Have spoken with occupational therapy and they are recommending outpatient 10. Hyperlipidemia a. Hold cholesterol meds until discharge 11. Hypothyroidism a. Continue Synthroid 175 dailyp.o. 12. HTN a. Resume metoprolol XL 25 if continues to be elevated   TODAY:    Since discharge she reports right shoulder pain.. much better control.Marland Kitchen good movement.    On PPI for stomach esophagitis vs.lcers.. protonix helped.  Needs re-eval of electrolytes.   CBGs running FBS 200s. Trending down on current regimen.    This visit occurred during the SARS-CoV-2 public health emergency.  Safety protocols were in place, including screening questions prior to the visit, additional usage of staff PPE, and extensive cleaning of exam room while observing appropriate contact time as indicated for disinfecting solutions.   COVID 19 screen:  No recent travel or known exposure to COVID19 The patient denies respiratory symptoms of COVID 19 at this time. The importance of social distancing was discussed today.     Review of Systems  Constitutional: Negative for chills and fever.  HENT: Negative for congestion and ear pain.   Eyes: Negative for pain and redness.  Respiratory: Negative for cough and shortness of breath.   Cardiovascular: Negative for chest pain, palpitations and leg swelling.  Gastrointestinal: Negative for abdominal pain, blood in stool, constipation, diarrhea, nausea and vomiting.  Genitourinary: Negative for dysuria.  Musculoskeletal: Negative for falls and myalgias.  Skin: Negative for rash.  Neurological: Negative for dizziness.  Psychiatric/Behavioral: Negative for  depression. The patient is not  nervous/anxious.       Past Medical History:  Diagnosis Date  . Allergy    seasonal  . Anxiety   . B12 deficiency   . Chest pain 04/05/2017  . CHF (congestive heart failure) ([place])   . Diabetes mellitus type 1, uncontrolled ([place])    "dr. just changed me to type 1, uncontrolled" (11/26/2018)  . DKA (diabetic ketoacidoses) 05/25/2018  . GERD (gastroesophageal reflux disease)   . Hyperlipidemia   . Hypertension    [name]  . Hypothyroidism   . IBS (irritable bowel syndrome)   . Intermittent vertigo   . 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 Leg cramping    "@ night" (09/18/2013)  . Migraine    "once q couple months" (11/26/2018)  . Osteoporosis   . Peripheral neuropathy   . Umbilical hernia    unrepaired (09/18/2013)    reports that she quit smoking about 29 years ago. Her smoking use included cigarettes. She has a 1.20 pack-year smoking history. She has never used smokeless tobacco. She reports current drug use. Drug: Marijuana. She reports that she does not drink alcohol.   Current Outpatient Medications:  .  Continuous Blood Gluc Sensor (FREESTYLE LIBRE 14 DAY SENSOR) MISC, by Does not apply route., Disp: , Rfl:  .  fexofenadine (ALLEGRA) 180 MG tablet, Take 1 tablet (180 mg total) by mouth daily., Disp: 90 tablet, Rfl: 3 .  insulin lispro (HUMALOG KWIKPEN) 100 UNIT/ML KiwkPen, Inject into skin 3 times a day at meal time according to the following sliding scale: CBG 70-120 - 0 units / CBG 121-150 - 1 unit / CBG 151-200 - 2 units / CBG 201-250 - 3 units / CBG 251-300 - 5 units / CBG 301-350 - 7 units / CBG 351-400+ - 9 units  YOU MUST FOLLOW A STRICT DIABETIC DIET (Patient taking differently: Inject into the skin See admin instructions. 8 units plus sliding scale Inject into skin 3 times a day at meal time according to the following sliding scale: CBG 70-120 - 0 units / CBG 121-150 - 1 unit / CBG 151-200 - 2 units / CBG 201-250 - 3 units / CBG 251-300 - 5  units / CBG 301-350 - 7 units / CBG 351-400+ - 9 units  YOU MUST FOLLOW A STRICT DIABETIC DIET), Disp: 4 pen, Rfl: 11 .  Insulin Pen Needle (PEN NEEDLES) 31G X 8 MM MISC, Use to inject insulin 4 times a day.  Dx: E11.10, Disp: 130 each, Rfl: 11 .  Lancets (ONETOUCH ULTRASOFT) lancets, Use to check blood sugar daily as needed.  Dx: E11.10, Disp: 100 each, Rfl: 3 .  levothyroxine (SYNTHROID) 175 MCG tablet, Take 175 mcg by mouth daily., Disp: , Rfl:  .  metoCLOPramide (REGLAN) 5 MG tablet, Take 5mg  before breakfast lunch and dinner and 10mg  at bedtime, Disp: 150 tablet, Rfl: 11 .  metoprolol succinate (TOPROL-XL) 25 MG 24 hr tablet, Take 1 tablet (25 mg total) by mouth daily., Disp: 90 tablet, Rfl: 2 .  ONETOUCH VERIO test strip, USE TO CHECK BLOOD SUGAR DAILY AS NEEDED. DX: E11.10, Disp: 100 strip, Rfl: 3 .  pantoprazole (PROTONIX) 40 MG tablet, Take 1 tablet (40 mg total) by mouth 2 (two) times daily before a meal., Disp: 60 tablet, Rfl: 3 .  promethazine (PHENERGAN) 25 MG tablet, Take 1 TABLET BY MOUTH EVERY 8 HOUR AS NEEDED FOR NAUSEA AND VOMITINB. (Patient taking differently: Take 25 mg  by mouth every 8 (eight) hours as needed for nausea or vomiting. Take 1 TABLET BY MOUTH EVERY 8 HOUR AS NEEDED FOR NAUSEA AND VOMITINB.), Disp: 30 tablet, Rfl: 1 .  rosuvastatin (CRESTOR) 40 MG tablet, TAKE 1 TABLET BY MOUTH EVERY DAY, Disp: 90 tablet, Rfl: 3 .  sertraline (ZOLOFT) 100 MG tablet, TAKE 1 TABLET BY MOUTH EVERY DAY, Disp: 90 tablet, Rfl: 1 .  TOUJEO SOLOSTAR 300 UNIT/ML Solostar Pen, Inject 16 Units into the skin at bedtime., Disp: , Rfl:    Observations/Objective: Blood pressure 96/68, pulse (!) 108, temperature (!) 97.4 F (36.3 C), temperature source Temporal, height 4\' 11"  (1.499 m), weight 111 lb (50.3 kg), last menstrual period 08/11/2012, SpO2 97 %.  Physical Exam Constitutional:      General: She is not in acute distress.    Appearance: Normal appearance. She is well-developed. She is not  ill-appearing or toxic-appearing.  HENT:     Head: Normocephalic.     Right Ear: Hearing, tympanic membrane, ear canal and external ear normal. Tympanic membrane is not erythematous, retracted or bulging.     Left Ear: Hearing, tympanic membrane, ear canal and external ear normal. Tympanic membrane is not erythematous, retracted or bulging.     Nose: No mucosal edema or rhinorrhea.     Right Sinus: No maxillary sinus tenderness or frontal sinus tenderness.     Left Sinus: No maxillary sinus tenderness or frontal sinus tenderness.     Mouth/Throat:     Pharynx: Uvula midline.  Eyes:     General: Lids are normal. Lids are everted, no foreign bodies appreciated.     Conjunctiva/sclera: Conjunctivae normal.     Pupils: Pupils are equal, round, and reactive to light.  Neck:     Thyroid: No thyroid mass or thyromegaly.     Vascular: No carotid bruit.     Trachea: Trachea normal.  Cardiovascular:     Rate and Rhythm: Normal rate and regular rhythm.     Pulses: Normal pulses.     Heart sounds: Normal heart sounds, S1 normal and S2 normal. No murmur heard.  No friction rub. No gallop.   Pulmonary:     Effort: Pulmonary effort is normal. No tachypnea or respiratory distress.     Breath sounds: Normal breath sounds. No decreased breath sounds, wheezing, rhonchi or rales.  Abdominal:     General: Bowel sounds are normal.     Palpations: Abdomen is soft.     Tenderness: There is no abdominal tenderness.  Musculoskeletal:     Cervical back: Normal range of motion and neck supple.  Skin:    General: Skin is warm and dry.     Findings: No rash.  Neurological:     Mental Status: She is alert.  Psychiatric:        Mood and Affect: Mood is not anxious or depressed.        Speech: Speech normal.        Behavior: Behavior normal. Behavior is cooperative.        Thought Content: Thought content normal.        Judgment: Judgment normal.      Assessment and Plan    Type 2 diabetes mellitus  with neurological complications ([place]) DKA resolved. Follow up with ENDO as planned. Low carb diet, medication compliance encouraged  Hypokalemia Due for re-eval of electrolytes. Good po intake now.  Nausea and vomiting Stable now on protonix.  Acute pain of right shoulder Significantly improved.  Kimala Horne, MD   

## 2020-09-16 NOTE — Telephone Encounter (Signed)
Please see the results of the monitor that I resulted today.

## 2020-10-19 NOTE — Assessment & Plan Note (Signed)
Due for re-eval of electrolytes. Good po intake now.

## 2020-10-19 NOTE — Assessment & Plan Note (Signed)
Significantly improved.

## 2020-10-19 NOTE — Assessment & Plan Note (Signed)
DKA resolved. Follow up with ENDO as planned. Low carb diet, medication compliance encouraged

## 2020-10-19 NOTE — Assessment & Plan Note (Signed)
Stable now on protonix.

## 2020-11-09 ENCOUNTER — Other Ambulatory Visit: Payer: Self-pay | Admitting: Family Medicine

## 2020-11-09 NOTE — Telephone Encounter (Signed)
Last office visit 09/16/2020 for Hospital follow up.  Last refilled 08/09/2020 for #30 with 1 refill.  No future appointments with PCP.

## 2020-11-19 NOTE — Progress Notes (Signed)
Cardiology Office Note   Date:  11/21/2020   ID:  [name] [name], DOB 1961/07/23, MRN [id number]  PCP:  Jinny Sanders, MD  Cardiologist:   Minus Breeding, MD   Chief Complaint  Patient presents with  . Dizziness  . Loss of Consciousness      History of Present Illness: [name] [name] is a 59 y.o. female who presents for follow up of a non ischemic cardiomyopathy.   She had normal coronaries on catheterization in 2012.  Repeat echocardiogram in 2018 revealed an EF of 35 to 40%.  Repeat cardiac catheterization demonstrated normal coronaries.  She was noted to have very poorly controlled diabetes.  She has been hospitalized on several occasions due to DKA.  (Most recently last week.  I reviewed these records for this visit.)   She has a history of medical noncompliance.  In July she was seen to follow up hypotension.    She had chest pain but a low risk perfusion study.  It is likely that her pain was related to her esophagitis and ulcers.   She had dizziness which I suggested was related to orthostatic hypotension.  ZIO patch did not demonstrate any arrhythmias.  Since I last saw her she lost her brother who lives with her.  He died suddenly apparently of heart disease.  She has been stressed.  She says she has had a couple syncopal spells and describes these at a corner store.  She says she knows the ground.  She did have warning.  She felt like she was going go down.  She did not describe trauma.  She was able to pick her self back up.  She was able to get up and drive home she would feel like she lost a whole evening.  She was not describing palpitations other than occasional skipped beats that she had before her monitor.  There is no new chest discomfort, neck or arm discomfort.  Has had no weight gain or edema.   Past Medical History:  Diagnosis Date  . Allergy    seasonal  . Anxiety   . B12 deficiency   . Chest pain 04/05/2017  . CHF (congestive heart failure) ([place])   .  Diabetes mellitus type 1, uncontrolled ([place])    "dr. just changed me to type 1, uncontrolled" (11/26/2018)  . DKA (diabetic ketoacidoses) 05/25/2018  . GERD (gastroesophageal reflux disease)   . Hyperlipidemia   . Hypertension   . Hypothyroidism   . IBS (irritable bowel syndrome)   . Intermittent vertigo   . 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 Leg cramping    "@ night" (09/18/2013)  . Migraine    "once q couple months" (11/26/2018)  . Osteoporosis   . Peripheral neuropathy   . Umbilical hernia    unrepaired (09/18/2013)    Past Surgical History:  Procedure Laterality Date  . BIOPSY  06/03/2019   Procedure: BIOPSY;  Surgeon: Jackquline Denmark, MD;  Location: [facility];  Service: Endoscopy;;  . BIOPSY  08/17/2020   Procedure: BIOPSY;  Surgeon: Thornton Park, MD;  Location: [facility];  Service: Gastroenterology;;  . [place]  . ENTEROSCOPY N/A 06/03/2019   Procedure: ENTEROSCOPY;  Surgeon: Jackquline Denmark, MD;  Location: [facility];  Service: Endoscopy;  Laterality: N/A;  . ESOPHAGOGASTRODUODENOSCOPY (EGD) WITH PROPOFOL N/A 08/17/2020   Procedure: ESOPHAGOGASTRODUODENOSCOPY (EGD) WITH PROPOFOL;  Surgeon: Thornton Park, MD;  Location: [facility];  Service: Gastroenterology;  Laterality:  N/A;  . LAPAROSCOPIC ASSISTED VAGINAL HYSTERECTOMY  09/23/2012   Procedure: LAPAROSCOPIC ASSISTED VAGINAL HYSTERECTOMY;  Surgeon: Linda Hedges, DO;  Location: [facility];  Service: Gynecology;  Laterality: N/A;  pull Dr Gregor Hams instrument  . LEFT HEART CATH AND CORONARY ANGIOGRAPHY N/A 02/11/2017   Procedure: Left Heart Cath and Coronary Angiography;  Surgeon: Lorretta Harp, MD;  Location: [facility];  Service: Cardiovascular;  Laterality: N/A;  . TUBAL LIGATION Bilateral 1992     Current Outpatient Medications  Medication Sig Dispense Refill  . Continuous Blood Gluc Sensor (FREESTYLE LIBRE 14 DAY SENSOR) MISC by Does not apply route.    .  fexofenadine (ALLEGRA) 180 MG tablet Take 1 tablet (180 mg total) by mouth daily. 90 tablet 3  . insulin lispro (HUMALOG KWIKPEN) 100 UNIT/ML KiwkPen Inject into skin 3 times a day at meal time according to the following sliding scale: CBG 70-120 - 0 units / CBG 121-150 - 1 unit / CBG 151-200 - 2 units / CBG 201-250 - 3 units / CBG 251-300 - 5 units / CBG 301-350 - 7 units / CBG 351-400+ - 9 units  YOU MUST FOLLOW A STRICT DIABETIC DIET (Patient taking differently: Inject into the skin See admin instructions. 8 units plus sliding scale Inject into skin 3 times a day at meal time according to the following sliding scale: CBG 70-120 - 0 units / CBG 121-150 - 1 unit / CBG 151-200 - 2 units / CBG 201-250 - 3 units / CBG 251-300 - 5 units / CBG 301-350 - 7 units / CBG 351-400+ - 9 units  YOU MUST FOLLOW A STRICT DIABETIC DIET) 4 pen 11  . Insulin Pen Needle (PEN NEEDLES) 31G X 8 MM MISC Use to inject insulin 4 times a day.  Dx: E11.10 130 each 11  . Lancets (ONETOUCH ULTRASOFT) lancets Use to check blood sugar daily as needed.  Dx: E11.10 100 each 3  . levothyroxine (SYNTHROID) 175 MCG tablet Take 175 mcg by mouth daily.    . metoCLOPramide (REGLAN) 5 MG tablet Take 5mg  before breakfast lunch and dinner and 10mg  at bedtime 150 tablet 11  . ONETOUCH VERIO test strip USE TO CHECK BLOOD SUGAR DAILY AS NEEDED. DX: E11.10 100 strip 3  . pantoprazole (PROTONIX) 40 MG tablet Take 1 tablet (40 mg total) by mouth 2 (two) times daily before a meal. 60 tablet 3  . rosuvastatin (CRESTOR) 40 MG tablet TAKE 1 TABLET BY MOUTH EVERY DAY 90 tablet 3  . sertraline (ZOLOFT) 100 MG tablet TAKE 1 TABLET BY MOUTH EVERY DAY 90 tablet 1  . TOUJEO SOLOSTAR 300 UNIT/ML Solostar Pen Inject 16 Units into the skin at bedtime.    . metoprolol succinate (TOPROL-XL) 25 MG 24 hr tablet Take 1 tablet (25 mg total) by mouth daily. 90 tablet 2  . promethazine (PHENERGAN) 25 MG tablet TAKE 1 TABLET BY MOUTH EVERY 8 HOUR AS  NEEDED FOR NAUSEA AND VOMITING (Patient not taking: Reported on 11/21/2020) 30 tablet 1   No current facility-administered medications for this visit.    Allergies:   Codeine    ROS:  Please see the history of present illness.   Otherwise, review of systems are positive for none.   All other systems are reviewed and negative.    PHYSICAL EXAM: VS:  BP 118/68 (BP Location: Left Arm, Patient Position: Sitting)   Pulse 84   Resp 14   Ht 4' 11.5" (1.511 m)   Wt 112  lb (50.8 kg)   LMP 08/11/2012   SpO2 95%   BMI 22.24 kg/m  , BMI Body mass index is 22.24 kg/m. GENERAL:  Well appearing NECK:  No jugular venous distention, waveform within normal limits, carotid upstroke brisk and symmetric, no bruits, no thyromegaly LUNGS:  Clear to auscultation bilaterally CHEST:  Unremarkable HEART:  PMI not displaced or sustained,S1 and S2 within normal limits, no S3, no S4, no clicks, no rubs, no murmurs ABD:  Flat, positive bowel sounds normal in frequency in pitch, no bruits, no rebound, no guarding, no midline pulsatile mass, no hepatomegaly, no splenomegaly EXT:  2 plus pulses throughout, no edema, no cyanosis no clubbing  EKG:  EKG is  ordered today. Rhythm, rate 103, axis within normal limits, intervals within normal limits, no acute ST-T wave changes.   Recent Labs: 08/19/2020: ALT 11; Hemoglobin 16.0; Platelets 313 09/16/2020: BUN 14; Creatinine, Ser 0.80; Magnesium 1.8; Potassium 4.1; Sodium 135    Lipid Panel    Component Value Date/Time   CHOL 197 01/27/2019 0809   TRIG 148.0 01/27/2019 0809   HDL 43.10 01/27/2019 0809   CHOLHDL 5 01/27/2019 0809   VLDL 29.6 01/27/2019 0809   LDLCALC 124 (H) 01/27/2019 0809   LDLDIRECT 206.0 07/27/2014 0745      Wt Readings from Last 3 Encounters:  11/21/20 112 lb (50.8 kg)  09/16/20 111 lb (50.3 kg)  08/22/20 110 lb (49.9 kg)      Other studies Reviewed: Additional studies/ records that were reviewed today include:   None Review of  the above records demonstrates:       ASSESSMENT AND PLAN:  Hypotension:     I think this is still the etiology of her complaints.  Today in the office her orthostatics demonstrated a significant drop in SBP.  He has had an ischemia work-up.  She has had a monitor with results as above.  I am going to ask her to get a compression garment for her waist and thighs.  If this does not work she might need midodrine.  Ongoing chest pressure:   She is not describing any new chest pain.  She did have a stress perfusion study earlier this year that was negative for ischemia.   Insulin-dependent diabetes:    A1c is 13.3.  He had previously been 15.  She is working withBedsole, Amy E, MD.  Hyperlipidemia:   LDL was most recently 124.  I am not completely sure of medication compliance.  I would like her to get a fasting lipid profile.   I do not see one recently.   Covid education:   He has been vaccinated.  Current medicines are reviewed at length with the patient today.  The patient does not have concerns regarding medicines.  The following changes have been made:  None  Labs/ tests ordered today include: None  Orders Placed This Encounter  Procedures  . EKG 12-Lead     Disposition:   FU with APP in six weeks.    Signed, Minus Breeding, MD  11/21/2020 10:23 AM    [facility]

## 2020-11-21 ENCOUNTER — Other Ambulatory Visit: Payer: Self-pay

## 2020-11-21 ENCOUNTER — Encounter: Payer: Self-pay | Admitting: Cardiology

## 2020-11-21 ENCOUNTER — Ambulatory Visit: Payer: 59 | Admitting: Cardiology

## 2020-11-21 VITALS — BP 118/68 | HR 84 | Resp 14 | Ht 59.5 in | Wt 112.0 lb

## 2020-11-21 DIAGNOSIS — E785 Hyperlipidemia, unspecified: Secondary | ICD-10-CM

## 2020-11-21 DIAGNOSIS — R0789 Other chest pain: Secondary | ICD-10-CM | POA: Diagnosis not present

## 2020-11-21 DIAGNOSIS — I951 Orthostatic hypotension: Secondary | ICD-10-CM

## 2020-11-21 DIAGNOSIS — E118 Type 2 diabetes mellitus with unspecified complications: Secondary | ICD-10-CM

## 2020-11-21 MED ORDER — METOPROLOL SUCCINATE ER 25 MG PO TB24
25.0000 mg | ORAL_TABLET | Freq: Every day | ORAL | 2 refills | Status: DC
Start: 2020-11-21 — End: 2021-11-13

## 2020-11-21 NOTE — Patient Instructions (Signed)
Medication Instructions:  No changes *If you need a refill on your cardiac medications before your next appointment, please call your pharmacy*  Lab Work: None ordered this visit  Testing/Procedures: None ordered this visit  Follow-Up: At [facility], you and your health needs are our priority.  As part of our continuing mission to provide you with exceptional heart care, we have created designated Provider Care Teams.  These Care Teams include your primary Cardiologist (physician) and Advanced Practice Providers (APPs -  Physician Assistants and Nurse Practitioners) who all work together to provide you with the care you need, when you need it.  Your next appointment:   6 week(s)  The format for your next appointment:   In Person  Provider:   Any available APP  Other Instructions Purchase a pair of Spanx or generic

## 2020-12-08 ENCOUNTER — Emergency Department (HOSPITAL_COMMUNITY)
Admission: EM | Admit: 2020-12-08 | Discharge: 2020-12-08 | Disposition: A | Discharge status: Home / Self Care | Payer: 59 | Attending: Emergency Medicine | Admitting: Emergency Medicine

## 2020-12-08 ENCOUNTER — Encounter (HOSPITAL_COMMUNITY): Payer: Self-pay | Admitting: Emergency Medicine

## 2020-12-08 ENCOUNTER — Other Ambulatory Visit: Payer: Self-pay

## 2020-12-08 DIAGNOSIS — Z20822 Contact with and (suspected) exposure to covid-19: Secondary | ICD-10-CM | POA: Diagnosis not present

## 2020-12-08 DIAGNOSIS — E1143 Type 2 diabetes mellitus with diabetic autonomic (poly)neuropathy: Secondary | ICD-10-CM | POA: Insufficient documentation

## 2020-12-08 DIAGNOSIS — E1169 Type 2 diabetes mellitus with other specified complication: Secondary | ICD-10-CM | POA: Diagnosis not present

## 2020-12-08 DIAGNOSIS — E1140 Type 2 diabetes mellitus with diabetic neuropathy, unspecified: Secondary | ICD-10-CM | POA: Insufficient documentation

## 2020-12-08 DIAGNOSIS — N182 Chronic kidney disease, stage 2 (mild): Secondary | ICD-10-CM | POA: Diagnosis not present

## 2020-12-08 DIAGNOSIS — Z794 Long term (current) use of insulin: Secondary | ICD-10-CM | POA: Diagnosis not present

## 2020-12-08 DIAGNOSIS — Z79899 Other long term (current) drug therapy: Secondary | ICD-10-CM | POA: Insufficient documentation

## 2020-12-08 DIAGNOSIS — E039 Hypothyroidism, unspecified: Secondary | ICD-10-CM | POA: Insufficient documentation

## 2020-12-08 DIAGNOSIS — Z87891 Personal history of nicotine dependence: Secondary | ICD-10-CM | POA: Diagnosis not present

## 2020-12-08 DIAGNOSIS — E1110 Type 2 diabetes mellitus with ketoacidosis without coma: Secondary | ICD-10-CM | POA: Insufficient documentation

## 2020-12-08 DIAGNOSIS — E785 Hyperlipidemia, unspecified: Secondary | ICD-10-CM | POA: Insufficient documentation

## 2020-12-08 DIAGNOSIS — I5032 Chronic diastolic (congestive) heart failure: Secondary | ICD-10-CM | POA: Diagnosis not present

## 2020-12-08 DIAGNOSIS — R112 Nausea with vomiting, unspecified: Secondary | ICD-10-CM | POA: Diagnosis not present

## 2020-12-08 DIAGNOSIS — I130 Hypertensive heart and chronic kidney disease with heart failure and stage 1 through stage 4 chronic kidney disease, or unspecified chronic kidney disease: Secondary | ICD-10-CM | POA: Diagnosis not present

## 2020-12-08 DIAGNOSIS — R1084 Generalized abdominal pain: Secondary | ICD-10-CM | POA: Diagnosis not present

## 2020-12-08 DIAGNOSIS — E1122 Type 2 diabetes mellitus with diabetic chronic kidney disease: Secondary | ICD-10-CM | POA: Insufficient documentation

## 2020-12-08 DIAGNOSIS — R519 Headache, unspecified: Secondary | ICD-10-CM | POA: Insufficient documentation

## 2020-12-08 LAB — CBC WITH DIFFERENTIAL/PLATELET
Abs Immature Granulocytes: 0.16 10*3/uL — ABNORMAL HIGH (ref 0.00–0.07)
Basophils Absolute: 0.1 10*3/uL (ref 0.0–0.1)
Basophils Relative: 1 %
Eosinophils Absolute: 0 10*3/uL (ref 0.0–0.5)
Eosinophils Relative: 0 %
HCT: 48.6 % — ABNORMAL HIGH (ref 36.0–46.0)
Hemoglobin: 16.2 g/dL — ABNORMAL HIGH (ref 12.0–15.0)
Immature Granulocytes: 1 %
Lymphocytes Relative: 17 %
Lymphs Abs: 2.6 10*3/uL (ref 0.7–4.0)
MCH: 28.7 pg (ref 26.0–34.0)
MCHC: 33.3 g/dL (ref 30.0–36.0)
MCV: 86.2 fL (ref 80.0–100.0)
Monocytes Absolute: 0.7 10*3/uL (ref 0.1–1.0)
Monocytes Relative: 5 %
Neutro Abs: 11.9 10*3/uL — ABNORMAL HIGH (ref 1.7–7.7)
Neutrophils Relative %: 76 %
Platelets: 423 10*3/uL — ABNORMAL HIGH (ref 150–400)
RBC: 5.64 MIL/uL — ABNORMAL HIGH (ref 3.87–5.11)
RDW: 14 % (ref 11.5–15.5)
WBC: 15.4 10*3/uL — ABNORMAL HIGH (ref 4.0–10.5)
nRBC: 0 % (ref 0.0–0.2)

## 2020-12-08 LAB — CBG MONITORING, ED
Glucose-Capillary: 406 mg/dL — ABNORMAL HIGH (ref 70–99)
Glucose-Capillary: 275 mg/dL — ABNORMAL HIGH (ref 70–99)
Glucose-Capillary: 283 mg/dL — ABNORMAL HIGH (ref 70–99)
Glucose-Capillary: 289 mg/dL — ABNORMAL HIGH (ref 70–99)

## 2020-12-08 LAB — URINALYSIS, ROUTINE W REFLEX MICROSCOPIC
Bacteria, UA: NONE SEEN
Bilirubin Urine: NEGATIVE
Glucose, UA: >=500 mg/dL — AB
Hgb urine dipstick: NEGATIVE
Ketones, ur: 20 mg/dL — AB
Leukocytes,Ua: NEGATIVE
Nitrite: NEGATIVE
Protein, ur: 30 mg/dL — AB
Specific Gravity, Urine: 1.025 (ref 1.005–1.030)
pH: 7 (ref 5.0–8.0)

## 2020-12-08 LAB — COMPREHENSIVE METABOLIC PANEL
ALT: 9 U/L (ref 0–44)
AST: 14 U/L — ABNORMAL LOW (ref 15–41)
Albumin: 4 g/dL (ref 3.5–5.0)
Alkaline Phosphatase: 70 U/L (ref 38–126)
Anion gap: 17 — ABNORMAL HIGH (ref 5–15)
BUN: 11 mg/dL (ref 6–20)
CO2: 21 mmol/L — ABNORMAL LOW (ref 22–32)
Calcium: 9.3 mg/dL (ref 8.9–10.3)
Chloride: 98 mmol/L (ref 98–111)
Creatinine, Ser: 0.67 mg/dL (ref 0.44–1.00)
GFR, Estimated: >60 mL/min (ref >60)
Glucose, Bld: 409 mg/dL — ABNORMAL HIGH (ref 70–99)
Potassium: 3.1 mmol/L — ABNORMAL LOW (ref 3.5–5.1)
Sodium: 136 mmol/L (ref 135–145)
Total Bilirubin: 0.7 mg/dL (ref 0.3–1.2)
Total Protein: 7 g/dL (ref 6.5–8.1)

## 2020-12-08 LAB — I-STAT VENOUS BLOOD GAS, ED
Acid-Base Excess: 2 mmol/L (ref 0.0–2.0)
Bicarbonate: 25.3 mmol/L (ref 20.0–28.0)
Calcium, Ion: 1.08 mmol/L — ABNORMAL LOW (ref 1.15–1.40)
HCT: 45 % (ref 36.0–46.0)
Hemoglobin: 15.3 g/dL — ABNORMAL HIGH (ref 12.0–15.0)
O2 Saturation: 90 %
Potassium: 3.6 mmol/L (ref 3.5–5.1)
Sodium: 139 mmol/L (ref 135–145)
TCO2: 26 mmol/L (ref 22–32)
pCO2, Ven: 36.3 mmHg — ABNORMAL LOW (ref 44.0–60.0)
pH, Ven: 7.45 — ABNORMAL HIGH (ref 7.250–7.430)
pO2, Ven: 56 mmHg — ABNORMAL HIGH (ref 32.0–45.0)

## 2020-12-08 LAB — RESP PANEL BY RT-PCR (FLU A&B, COVID) ARPGX2
Influenza A by PCR: NEGATIVE
Influenza B by PCR: NEGATIVE
SARS Coronavirus 2 by RT PCR: NEGATIVE

## 2020-12-08 LAB — BETA-HYDROXYBUTYRIC ACID: Beta-Hydroxybutyric Acid: 1.07 mmol/L — ABNORMAL HIGH (ref 0.05–0.27)

## 2020-12-08 LAB — LIPASE, BLOOD: Lipase: 18 U/L (ref 11–51)

## 2020-12-08 MED ORDER — ONDANSETRON HCL 4 MG/2ML IJ SOLN
4.0000 mg | Freq: Once | INTRAMUSCULAR | Status: AC
  Administered 2020-12-08: 14:00:00 4 mg via INTRAVENOUS
  Filled 2020-12-08: qty 2

## 2020-12-08 MED ORDER — SODIUM CHLORIDE 0.9 % IV BOLUS
500.0000 mL | Freq: Once | INTRAVENOUS | Status: AC
  Administered 2020-12-08: 14:00:00 500 mL via INTRAVENOUS

## 2020-12-08 MED ORDER — SODIUM CHLORIDE 0.9 % IV BOLUS
500.0000 mL | Freq: Once | INTRAVENOUS | Status: AC
  Administered 2020-12-08: 11:00:00 500 mL via INTRAVENOUS

## 2020-12-08 MED ORDER — METOCLOPRAMIDE HCL 5 MG/ML IJ SOLN
10.0000 mg | Freq: Once | INTRAMUSCULAR | Status: AC
  Administered 2020-12-08: 11:00:00 10 mg via INTRAVENOUS
  Filled 2020-12-08: qty 2

## 2020-12-08 MED ORDER — KETOROLAC TROMETHAMINE 15 MG/ML IJ SOLN
15.0000 mg | Freq: Once | INTRAMUSCULAR | Status: AC
  Administered 2020-12-08: 14:00:00 15 mg via INTRAVENOUS
  Filled 2020-12-08: qty 1

## 2020-12-08 MED ORDER — BUTALBITAL-APAP-CAFFEINE 50-325-40 MG PO TABS: 1.0000 | ORAL_TABLET | Freq: Four times a day (QID) | ORAL | 0 refills | Status: DC | PRN

## 2020-12-08 NOTE — ED Triage Notes (Addendum)
Patient arrived with EMS from home reports elevated blood sugar this morning with emesis and diarrhea , CBG=406 at triage , denies fever or chills , she received Zofran 4 mg IV by EMS .

## 2020-12-08 NOTE — ED Notes (Signed)
Ambulated patient to the bathroom, patient has complaints of head pain and dizziness when ambulating. Pt steady on feet despite complaints.Urine sample collected and sent to lab.

## 2020-12-08 NOTE — ED Notes (Signed)
DC instructions reviewed with pt.  Pt verbalized instructions.  Pt Dc.

## 2020-12-08 NOTE — ED Notes (Signed)
Did ekg shown to Dr Criss Alvine patient is resting with call bell in reach

## 2020-12-08 NOTE — ED Provider Notes (Signed)
[facility] Provider Note   CSN: [id number] Arrival date & time: 12/08/20  [id number]     History Chief Complaint  Patient presents with  . Hyperglycemia    Emesis/Diarrhea    [name] [name] is a 59 y.o. female diabetes, DKA (admitted 08/2020), gastroparesis, GERD, hypertension, NSTEMI, CHF, CKD, hyperlipidemia.  Patient presents with a chief complaint of nausea vomiting and generalized abdominal pain.  Patient reports that her symptoms started at 0200 this morning.  She denies any hematemesis or coffee-ground emesis.  Patient rates her abdominal pain as a 4-10 pain scale, is constant, pain is worse when she vomits.  Patient endorses loose stool x2 days, last solid bowel movement 12/28.  Patient reports that she took her prescribed Reglan and Protonix last night.  Patient reports that she is prescribed insulin for diabetes however she has not used it in the last month stating " I just don't want to take it." Patient denies any fever, chills, chest pain, leg swelling, shortness of breath, cough, abdominal distention, melena, bloody stool.    Patient also complains of a headache, intermittent over the last 3 days, 8/10 on the pain scale, started gradually, reports it feels similar to previous migraines she has had.  Patient reports she has been vaccinated for COVID-19.  HPI     Past Medical History:  Diagnosis Date  . Allergy    seasonal  . Anxiety   . B12 deficiency   . Chest pain 04/05/2017  . CHF (congestive heart failure) ([place])   . Diabetes mellitus type 1, uncontrolled ([place])    "dr. just changed me to type 1, uncontrolled" (11/26/2018)  . DKA (diabetic ketoacidoses) 05/25/2018  . GERD (gastroesophageal reflux disease)   . Hyperlipidemia   . Hypertension   . Hypothyroidism   . IBS (irritable bowel syndrome)   . Intermittent vertigo   . 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 Leg cramping    "@ night"  (09/18/2013)  . Migraine    "once q couple months" (11/26/2018)  . Osteoporosis   . Peripheral neuropathy   . Umbilical hernia    unrepaired (09/18/2013)    Patient Active Problem List   Diagnosis Date Noted  . Educated about COVID-19 virus infection 08/21/2020  . Type 2 diabetes mellitus with complication, with long-term current use of insulin ([place]) 08/21/2020  . Gastric erosion   . DKA (diabetic ketoacidoses) 08/16/2020  . Hematemesis   . LUQ pain   . Acute pain of right shoulder 08/09/2020  . Left foot pain 06/17/2020  . Cellulitis of left hand 06/14/2020  . Laceration of left hand without foreign body 06/14/2020  . Need for prophylactic vaccination against cholera with typhoid-paratyphoid (cholera + TAB) vaccine 02/11/2020  . Urinary frequency 02/10/2020  . Chronic diastolic HF (heart failure) ([place]) 10/12/2019  . Duodenal ulcer without hemorrhage or perforation   . Acute esophagitis   . Enteritis   . Snoring 04/10/2019  . Routine general medical examination at a health care facility 04/10/2019  . Constipation   . Nausea and vomiting 03/25/2019  . SBO (small bowel obstruction) ([place]) 11/26/2018  . Tachycardia 08/04/2018  . MDD (major depressive disorder), single episode, mild ([place]) 07/01/2018  . Colitis 03/07/2018  . Chronic chest pain   . Chronic systolic CHF (congestive heart failure) ([place]) 01/14/2018  . Noncompliance 01/14/2018  . Migraines 12/11/2017  . Chest discomfort 09/18/2017  . Phrygian cap 06/20/2017  . Leukocytosis   .  Abdominal pain   . Colitis presumed infectious   . Nonischemic cardiomyopathy ([place]) 03/22/2017  . Hypokalemia 02/15/2017  . NSTEMI (non-ST elevated myocardial infarction) ([place]) 02/11/2017  . H. pylori infection 02/09/2017  . Hypomagnesemia 02/09/2017  . Gastroparesis due to DM ([place]) 01/22/2017  . Type 2 diabetes mellitus with neurological complications ([place]) [id number]  . Paroxysmal A-fib ([place]) 02/01/2015  . Kidney disease, chronic,  stage 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) 10/29/2013  . Hepatic lesion 09/18/2013  . Vitamin D deficiency 05/20/2012  . Essential hypertension 02/01/2011  . Osteoporosis 02/01/2011  . B12 deficiency 04/04/2010  . GERD 04/08/2009  . Generalized anxiety disorder 06/22/2008  . Hypothyroidism 04/29/2008  . Hyperlipidemia 04/29/2008  . Irritable bowel syndrome 04/29/2008    Past Surgical History:  Procedure Laterality Date  . BIOPSY  06/03/2019   Procedure: BIOPSY;  Surgeon: Jackquline Denmark, MD;  Location: [facility];  Service: Endoscopy;;  . BIOPSY  08/17/2020   Procedure: BIOPSY;  Surgeon: Thornton Park, MD;  Location: [facility];  Service: Gastroenterology;;  . [place]  . ENTEROSCOPY N/A 06/03/2019   Procedure: ENTEROSCOPY;  Surgeon: Jackquline Denmark, MD;  Location: [facility];  Service: Endoscopy;  Laterality: N/A;  . ESOPHAGOGASTRODUODENOSCOPY (EGD) WITH PROPOFOL N/A 08/17/2020   Procedure: ESOPHAGOGASTRODUODENOSCOPY (EGD) WITH PROPOFOL;  Surgeon: Thornton Park, MD;  Location: [facility];  Service: Gastroenterology;  Laterality: N/A;  . LAPAROSCOPIC ASSISTED VAGINAL HYSTERECTOMY  09/23/2012   Procedure: LAPAROSCOPIC ASSISTED VAGINAL HYSTERECTOMY;  Surgeon: Linda Hedges, DO;  Location: [facility];  Service: Gynecology;  Laterality: N/A;  pull Dr Gregor Hams instrument  . LEFT HEART CATH AND CORONARY ANGIOGRAPHY N/A 02/11/2017   Procedure: Left Heart Cath and Coronary Angiography;  Surgeon: Lorretta Harp, MD;  Location: [facility];  Service: Cardiovascular;  Laterality: N/A;  . TUBAL LIGATION Bilateral 1992     OB History    Gravida  2   Para  2   Term      Preterm      AB      Living        SAB      IAB      Ectopic      Multiple      Live Births              Family History  Problem Relation Age of Onset  . Diabetes Other   . Heart disease Other   . Heart failure Mother 69       Her heart skipped a beat and stopped - per pt  . Diabetes Maternal  Grandmother   . Alzheimer's disease Maternal Grandmother   . Coronary artery disease Maternal Grandmother   . Heart attack Maternal Grandfather   . Heart failure Brother   . Diabetes Brother   . Colon polyps Neg Hx   . Esophageal cancer Neg Hx   . Liver cancer Neg Hx   . Pancreatic cancer Neg Hx   . Stomach cancer Neg Hx   . Crohn's disease Neg Hx   . Rectal cancer Neg Hx     Social History   Tobacco Use  . Smoking status: Former Smoker    Packs/day: 0.12    Years: 10.00    Pack years: 1.20    Types: Cigarettes    Quit date: 12/10/1990    Years since quitting: 30.0  . Smokeless tobacco: Never Used  Vaping Use  . Vaping Use: Never used  Substance Use Topics  . Alcohol use:  Never    Alcohol/week: 0.0 standard drinks  . Drug use: Yes    Types: Marijuana    Comment: last use 2 days ago    Home Medications Prior to Admission medications   Medication Sig Start Date End Date Taking? Authorizing Provider  butalbital-acetaminophen-caffeine (FIORICET) 50-325-40 MG tablet Take 1 tablet by mouth every 6 (six) hours as needed for headache. 12/08/20 12/08/21 Yes Nimisha Rathel, Rudell Cobb, PA-C  Continuous Blood Gluc Sensor (FREESTYLE LIBRE 14 DAY SENSOR) MISC by Does not apply route. 04/01/19   [provider]  fexofenadine (ALLEGRA) 180 MG tablet Take 1 tablet (180 mg total) by mouth daily. 02/11/20   Bedsole, Amy E, MD  insulin lispro (HUMALOG KWIKPEN) 100 UNIT/ML KiwkPen Inject into skin 3 times a day at meal time according to the following sliding scale: CBG 70-120 - 0 units / CBG 121-150 - 1 unit / CBG 151-200 - 2 units / CBG 201-250 - 3 units / CBG 251-300 - 5 units / CBG 301-350 - 7 units / CBG 351-400+ - 9 units  YOU MUST FOLLOW A STRICT DIABETIC DIET Patient taking differently: Inject into the skin See admin instructions. 8 units plus sliding scale Inject into skin 3 times a day at meal time according to the following sliding scale: CBG 70-120 - 0 units / CBG 121-150  - 1 unit / CBG 151-200 - 2 units / CBG 201-250 - 3 units / CBG 251-300 - 5 units / CBG 301-350 - 7 units / CBG 351-400+ - 9 units  YOU MUST FOLLOW A STRICT DIABETIC DIET 07/01/18   Bedsole, Amy E, MD  Insulin Pen Needle (PEN NEEDLES) 31G X 8 MM MISC Use to inject insulin 4 times a day.  Dx: E11.10 12/23/17   Jinny Sanders, MD  Lancets (ONETOUCH ULTRASOFT) lancets Use to check blood sugar daily as needed.  Dx: E11.10 12/23/17   Jinny Sanders, MD  levothyroxine (SYNTHROID) 175 MCG tablet Take 175 mcg by mouth daily. 06/03/20   [provider]  metoCLOPramide (REGLAN) 5 MG tablet Take 5mg  before breakfast lunch and dinner and 10mg  at bedtime 08/19/20   Nita Sells, MD  metoprolol succinate (TOPROL-XL) 25 MG 24 hr tablet Take 1 tablet (25 mg total) by mouth daily. 11/21/20   Minus Breeding, MD  ONETOUCH VERIO test strip USE TO CHECK BLOOD SUGAR DAILY AS NEEDED. DX: E11.10 09/09/19   Bedsole, Amy E, MD  pantoprazole (PROTONIX) 40 MG tablet Take 1 tablet (40 mg total) by mouth 2 (two) times daily before a meal. 08/19/20   Nita Sells, MD  promethazine (PHENERGAN) 25 MG tablet TAKE 1 TABLET BY MOUTH EVERY 8 HOUR AS NEEDED FOR NAUSEA AND VOMITING Patient not taking: Reported on 11/21/2020 11/10/20   Jinny Sanders, MD  rosuvastatin (CRESTOR) 40 MG tablet TAKE 1 TABLET BY MOUTH EVERY DAY 04/12/20   Minus Breeding, MD  sertraline (ZOLOFT) 100 MG tablet TAKE 1 TABLET BY MOUTH EVERY DAY 11/26/19   Bedsole, Amy E, MD  TOUJEO SOLOSTAR 300 UNIT/ML Solostar Pen Inject 16 Units into the skin at bedtime. 08/19/20   Nita Sells, MD    Allergies    Codeine  Review of Systems   Review of Systems  Constitutional: Negative for chills and fever.  Eyes: Negative for visual disturbance.  Respiratory: Negative for cough and shortness of breath.   Cardiovascular: Negative for chest pain.  Gastrointestinal: Positive for abdominal pain (Generalized), diarrhea, nausea and vomiting.  Negative for abdominal distention and  blood in stool.  Genitourinary: Negative for difficulty urinating and dysuria.  Musculoskeletal: Negative for back pain and neck pain.  Skin: Negative for color change and rash.  Neurological: Positive for headaches. Negative for dizziness, syncope and light-headedness.  Psychiatric/Behavioral: Negative for confusion.    Physical Exam Updated Vital Signs BP (!) 175/96 (BP Location: Right Arm)   Pulse 100   Temp 98.2 F (36.8 C) (Oral)   Resp 16   Ht 4\' 10"  (1.473 m)   Wt 47.6 kg   LMP 08/11/2012   SpO2 100%   BMI 21.95 kg/m   Physical Exam Vitals and nursing note reviewed.  Constitutional:      General: She is not in acute distress.    Appearance: She is not ill-appearing, toxic-appearing or diaphoretic.  HENT:     Head: Normocephalic.  Eyes:     General: No scleral icterus.       Right eye: No discharge.        Left eye: No discharge.  Neck:     Vascular: No JVD.  Cardiovascular:     Rate and Rhythm: Tachycardia present.     Heart sounds: Normal heart sounds.  Pulmonary:     Effort: Pulmonary effort is normal.     Breath sounds: Normal breath sounds. No wheezing, rhonchi or rales.  Abdominal:     General: Bowel sounds are normal. There is no distension.     Palpations: Abdomen is soft. There is no mass or pulsatile mass.     Tenderness: There is abdominal tenderness in the epigastric area. There is no guarding. Negative signs include Rovsing's sign.  Musculoskeletal:     Cervical back: Neck supple.     Right lower leg: No edema.     Left lower leg: No edema.  Skin:    General: Skin is warm and dry.     Coloration: Skin is not jaundiced or pale.  Neurological:     General: No focal deficit present.     Mental Status: She is alert.  Psychiatric:        Behavior: Behavior is cooperative.     ED Results / Procedures / Treatments   Labs (all labs ordered are listed, but only abnormal results are displayed) Labs Reviewed   CBC WITH DIFFERENTIAL/PLATELET - Abnormal; Notable for the following components:      Result Value   WBC 15.4 (*)    RBC 5.64 (*)    Hemoglobin 16.2 (*)    HCT 48.6 (*)    Platelets 423 (*)    Neutro Abs 11.9 (*)    Abs Immature Granulocytes 0.16 (*)    All other components within normal limits  COMPREHENSIVE METABOLIC PANEL - Abnormal; Notable for the following components:   Potassium 3.1 (*)    CO2 21 (*)    Glucose, Bld 409 (*)    AST 14 (*)    Anion gap 17 (*)    All other components within normal limits  URINALYSIS, ROUTINE W REFLEX MICROSCOPIC - Abnormal; Notable for the following components:   Glucose, UA >=500 (*)    Ketones, ur 20 (*)    Protein, ur 30 (*)    All other components within normal limits  BETA-HYDROXYBUTYRIC ACID - Abnormal; Notable for the following components:   Beta-Hydroxybutyric Acid 1.07 (*)    All other components within normal limits  CBG MONITORING, ED - Abnormal; Notable for the following components:   Glucose-Capillary 406 (*)    All other components  within normal limits  I-STAT VENOUS BLOOD GAS, ED - Abnormal; Notable for the following components:   pH, Ven 7.450 (*)    pCO2, Ven 36.3 (*)    pO2, Ven 56.0 (*)    Calcium, Ion 1.08 (*)    Hemoglobin 15.3 (*)    All other components within normal limits  CBG MONITORING, ED - Abnormal; Notable for the following components:   Glucose-Capillary 283 (*)    All other components within normal limits  CBG MONITORING, ED - Abnormal; Notable for the following components:   Glucose-Capillary 275 (*)    All other components within normal limits  CBG MONITORING, ED - Abnormal; Notable for the following components:   Glucose-Capillary 289 (*)    All other components within normal limits  RESP PANEL BY RT-PCR (FLU A&B, COVID) [id number]  LIPASE, BLOOD    EKG EKG Interpretation  Date/Time:  Thursday December 08 2020 11:36:58 EST Ventricular Rate:  110 PR Interval:    QRS Duration: 79 QT  Interval:  362 QTC Calculation: 490 R Axis:   101 Text Interpretation: Sinus tachycardia Atrial premature complex Right axis deviation Borderline T wave abnormalities Borderline prolonged QT interval Confirmed by Sherwood Gambler [phone]) on 12/08/2020 1:01:27 PM   Radiology No results found.  Procedures Procedures (including critical care time)  Medications Ordered in ED Medications  metoCLOPramide (REGLAN) injection 10 mg (10 mg Intravenous Given 12/08/20 1054)  sodium chloride 0.9 % bolus 500 mL (0 mLs Intravenous Stopped 12/08/20 1132)  sodium chloride 0.9 % bolus 500 mL (500 mLs Intravenous New Bag/Given 12/08/20 1344)  ondansetron (ZOFRAN) injection 4 mg (4 mg Intravenous Given 12/08/20 1345)  ketorolac (TORADOL) 15 MG/ML injection 15 mg (15 mg Intravenous Given 12/08/20 1345)    ED Course  I have reviewed the triage vital signs and the nursing notes.  Pertinent labs & imaging results that were available during my care of the patient were reviewed by me and considered in my medical decision making (see chart for details).    MDM Rules/Calculators/A&P                          Alert 59 year old female, nontoxic appearing, appears uncomfortable.  Presents with chief complaint of nausea, vomiting and generalized abdominal pain.  Patient has history of diabetes, is prescribed insulin however has not taken in the past month.  Patient reports that her blood glucose normally in 200s.  Concern for DKA as CMP showed bicarb slightly decreased at 21, anion gap slightly elevated at 17.  CBG 275.  Beta hydroxy butyric acid and venous blood gas pending.  Patient was nausea vomiting and diarrhea could also be attributed to COVID-19, less likely as patient is afebrile denies any cough and has been vaccinated.    Patient is slightly tachycardic with rate 100s.  Do not think this is attributed to PE as patient has no shortness of breath, chest pain, recent surgeries, recent immobilization, active  cancer, history of DVT/PE.    11:45 Patient reports feeling better after receiving IV fluids and Reglan.  Beta hydroxybutyric acid 1.07 and VBG showed pH 7.450 and pCO2 36.3; which support patient is not in DKA.  Lipase within normal limits, less concerning for pancreatitis.  COVID-19 and influenza are negative.    Patient reports her headache is gradually become worse and she was feeling nauseated.  Patient continues to compare this headache to previous migraine she has had.  Patient was given Toradol and  Zofran.  Patient did not appear fluid overloaded after receiving she will fluid bolus and was given additional 500 mL.    Potassium at 3.1; patient refused oral potassium supplementation stating she did not think she could tolerate it.    Urinalysis shows negative nitrate, leukocytes negative, bacteria none seen, WBC 0-5 which represents no acute infection.  Glucose is greater than 500, ketones 20; likely due to patient's uncontrolled diabetes.    Patient was given education on the importance of taking her insulin as prescribed.  Expressed that if her sugars remain uncontrolled she did develop DKA which if untreated could lead to death.  Patient expressed understanding.  Will have patient follow-up with primary care provider for further diabetic management.  Patient reports that she has no Phenergan, Reglan and Protonix at home.  She states that she has a appointment with her primary care provider scheduled for next week.    Patient was able to tolerate p.o. challenge.  Reports headache and nausea have improved.  Patient vitals remain stable.  Will discharge with prescription and your Fioricet.  Discussed results, findings, treatment and follow up. Patient advised of return precautions. Patient verbalized understanding and agreed with plan.   Final Clinical Impression(s) / ED Diagnoses Final diagnoses:  Acute nonintractable headache, unspecified headache type  Non-intractable vomiting with  nausea, unspecified vomiting type    Rx / DC Orders ED Discharge Orders         Ordered    butalbital-acetaminophen-caffeine (FIORICET) 50-325-40 MG tablet  Every 6 hours PRN        12/08/20 1516           Loni Beckwith, PA-C 12/08/20 1531    Sherwood Gambler, MD 12/11/20 [phone]

## 2020-12-08 NOTE — Discharge Instructions (Addendum)
You came to the emergency department to be evaluated for your nausea and vomiting.  Your lab work was reassuring that pancreatitis or diabetic ketoacidosis (DKA) are not causing your symptoms.  During her interview you mentioned that you have not been taking insulin for the last month.  As we discussed it is very important that you start taking this medication as prescribed.  Please follow-up with your primary care provider to discuss your diabetic management.  Continue to take your Reglan, Phenergan and Protonix as prescribed.  I have given you prescription for Fioricet may take 1 tablet by mouth every 6 hours as needed for headache, do not take more than 6 tablets in 1 day.  This medication contains Tylenol, if taking other over-the-counter medications please look at Tylenol content.  Do not take more than 3,000mg  of Tylenol in 1 day.  Fioricet can cause rebound headaches please do not use more than 3 days in 1 month.   Please read instructions below. Drink clear liquids until your stomach feels better. Then, slowly introduce bland foods into your diet as tolerated, such as bread, rice, apples, bananas. Follow up with your primary care if symptoms persist. Return to the ER for severe abdominal pain, fever, uncontrollable vomiting, or new or concerning symptoms.   Get help right away if: Your headache becomes severe quickly. Your headache gets worse after moderate to intense physical activity. You have repeated vomiting. You have a stiff neck. You have a loss of vision. You have problems with speech. You have pain in the eye or ear. You have muscular weakness or loss of muscle control. You lose your balance or have trouble walking. You feel faint or pass out. You have confusion. You have a seizure.

## 2020-12-13 ENCOUNTER — Ambulatory Visit: Payer: 59 | Admitting: Family Medicine

## 2020-12-13 DIAGNOSIS — Z0289 Encounter for other administrative examinations: Secondary | ICD-10-CM

## 2021-01-05 ENCOUNTER — Ambulatory Visit: Payer: 59 | Admitting: Physician Assistant

## 2021-01-05 ENCOUNTER — Other Ambulatory Visit: Payer: Self-pay

## 2021-01-05 VITALS — BP 134/70 | HR 94 | Ht 59.5 in | Wt 110.4 lb

## 2021-01-05 DIAGNOSIS — I951 Orthostatic hypotension: Secondary | ICD-10-CM | POA: Diagnosis not present

## 2021-01-05 DIAGNOSIS — R55 Syncope and collapse: Secondary | ICD-10-CM | POA: Diagnosis not present

## 2021-01-05 DIAGNOSIS — E785 Hyperlipidemia, unspecified: Secondary | ICD-10-CM | POA: Diagnosis not present

## 2021-01-05 DIAGNOSIS — I10 Essential (primary) hypertension: Secondary | ICD-10-CM

## 2021-01-05 DIAGNOSIS — Z8639 Personal history of other endocrine, nutritional and metabolic disease: Secondary | ICD-10-CM | POA: Diagnosis not present

## 2021-01-05 MED ORDER — MIDODRINE HCL 2.5 MG PO TABS: 2.5000 mg | ORAL_TABLET | Freq: Every day | ORAL | 6 refills | Status: DC | PRN

## 2021-01-05 NOTE — Progress Notes (Signed)
Cardiology Office Note:    Date:  01/07/2021   ID:  [name] [name], DOB 01/07/1961, MRN [id number]  PCP:  Jinny Sanders, MD  [facility] Cardiologist:  Minus Breeding, MD  [place] Electrophysiologist:  None   Referring MD: Jinny Sanders, MD   Chief Complaint  Patient presents with  . Follow-up    Seen for Dr. Percival Spanish    History of Present Illness:    [name] [name] is a 60 y.o. female with a hx of DM type I, hypertension, hyperlipidemia, hypothyroidism, CKD stage II and history of nonischemic cardiomyopathy.  Cardiac catheterization in 2012 revealed normal coronary arteries.  Repeat echocardiogram in 2018 showed EF 35 to 40%.  Cardiac catheterization performed on 02/11/2017 continue to show normal coronary arteries as well.  She has a history of very poorly controlled diabetes and has been hospitalized several times due to DKA.  Last echocardiogram obtained on 01/13/2019 showed EF 55 to 60%.  Myoview obtained on 07/20/2020 showed EF 64%, overall low risk study, inferior and apical hypokinesis but suspect this is artifact, no ischemia, consider echocardiogram to reassess wall motion.  She has dizziness which was related to orthostatic hypotension.  Her Zio patch monitor did not show any arrhythmia.  She also has had a couple syncopal episode as well.  Compressions stocking was recommended for her orthostatic hypotension.  Since the last visit, patient presented to the ED on 12/08/2020 with nausea vomiting and generalized abdominal pain.  She also reported that she has not taking any of the insulin for the past month.  Although blood sugar was in the 400 range, fortunately patient was not in DKA.  She presents today for orthostatic hypotension.  Since the previous visit, she has passed out a few more times.  She is wearing spandex and knee-high compression stocking.  Syncope usually occurs after she tried to stand up too quickly.  I discussed the case with her primary cardiologist Dr.  Percival Spanish, I would recommend midodrine 2.5 mg if her morning systolic pressure is less than 110.  If systolic blood pressure is equal or greater than 110, she can hopefully function with compression stocking and spandex instead.  Otherwise she denies any chest pain or shortness of breath.   Past Medical History:  Diagnosis Date  . Allergy    seasonal  . Anxiety   . B12 deficiency   . Chest pain 04/05/2017  . CHF (congestive heart failure) ([place])   . Diabetes mellitus type 1, uncontrolled ([place])    "dr. just changed me to type 1, uncontrolled" (11/26/2018)  . DKA (diabetic ketoacidoses) 05/25/2018  . GERD (gastroesophageal reflux disease)   . Hyperlipidemia   . Hypertension   . Hypothyroidism   . IBS (irritable bowel syndrome)   . Intermittent vertigo   . 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 Leg cramping    "@ night" (09/18/2013)  . Migraine    "once q couple months" (11/26/2018)  . Osteoporosis   . Peripheral neuropathy   . Umbilical hernia    unrepaired (09/18/2013)    Past Surgical History:  Procedure Laterality Date  . BIOPSY  06/03/2019   Procedure: BIOPSY;  Surgeon: Jackquline Denmark, MD;  Location: [facility];  Service: Endoscopy;;  . BIOPSY  08/17/2020   Procedure: BIOPSY;  Surgeon: Thornton Park, MD;  Location: [facility];  Service: Gastroenterology;;  . [place]  . ENTEROSCOPY N/A 06/03/2019   Procedure: ENTEROSCOPY;  Surgeon: Lyndel Safe,  Peyton Bottoms, MD;  Location: [facility];  Service: Endoscopy;  Laterality: N/A;  . ESOPHAGOGASTRODUODENOSCOPY (EGD) WITH PROPOFOL N/A 08/17/2020   Procedure: ESOPHAGOGASTRODUODENOSCOPY (EGD) WITH PROPOFOL;  Surgeon: Thornton Park, MD;  Location: [facility];  Service: Gastroenterology;  Laterality: N/A;  . LAPAROSCOPIC ASSISTED VAGINAL HYSTERECTOMY  09/23/2012   Procedure: LAPAROSCOPIC ASSISTED VAGINAL HYSTERECTOMY;  Surgeon: Linda Hedges, DO;  Location: [facility];  Service: Gynecology;  Laterality:  N/A;  pull Dr Gregor Hams instrument  . LEFT HEART CATH AND CORONARY ANGIOGRAPHY N/A 02/11/2017   Procedure: Left Heart Cath and Coronary Angiography;  Surgeon: Lorretta Harp, MD;  Location: [facility];  Service: Cardiovascular;  Laterality: N/A;  . TUBAL LIGATION Bilateral 1992    Current Medications: Current Meds  Medication Sig  . butalbital-acetaminophen-caffeine (FIORICET) 50-325-40 MG tablet Take 1 tablet by mouth every 6 (six) hours as needed for headache.  . Continuous Blood Gluc Sensor (FREESTYLE LIBRE 14 DAY SENSOR) MISC by Does not apply route.  . fexofenadine (ALLEGRA) 180 MG tablet Take 1 tablet (180 mg total) by mouth daily.  . insulin lispro (HUMALOG KWIKPEN) 100 UNIT/ML KiwkPen Inject into skin 3 times a day at meal time according to the following sliding scale: CBG 70-120 - 0 units / CBG 121-150 - 1 unit / CBG 151-200 - 2 units / CBG 201-250 - 3 units / CBG 251-300 - 5 units / CBG 301-350 - 7 units / CBG 351-400+ - 9 units  YOU MUST FOLLOW A STRICT DIABETIC DIET (Patient taking differently: Inject into the skin See admin instructions. 8 units plus sliding scale Inject into skin 3 times a day at meal time according to the following sliding scale: CBG 70-120 - 0 units / CBG 121-150 - 1 unit / CBG 151-200 - 2 units / CBG 201-250 - 3 units / CBG 251-300 - 5 units / CBG 301-350 - 7 units / CBG 351-400+ - 9 units  YOU MUST FOLLOW A STRICT DIABETIC DIET)  . Insulin Pen Needle (PEN NEEDLES) 31G X 8 MM MISC Use to inject insulin 4 times a day.  Dx: E11.10  . Lancets (ONETOUCH ULTRASOFT) lancets Use to check blood sugar daily as needed.  Dx: E11.10  . levothyroxine (SYNTHROID) 175 MCG tablet Take 175 mcg by mouth daily.  . metoCLOPramide (REGLAN) 5 MG tablet Take 5mg  before breakfast lunch and dinner and 10mg  at bedtime  . metoprolol succinate (TOPROL-XL) 25 MG 24 hr tablet Take 1 tablet (25 mg total) by mouth daily.  . midodrine (PROAMATINE) 2.5 MG tablet Take 1  tablet (2.5 mg total) by mouth daily as needed. PER BP AS DIRECTED <110  . ONETOUCH VERIO test strip USE TO CHECK BLOOD SUGAR DAILY AS NEEDED. DX: E11.10  . pantoprazole (PROTONIX) 40 MG tablet Take 1 tablet (40 mg total) by mouth 2 (two) times daily before a meal.  . promethazine (PHENERGAN) 25 MG tablet TAKE 1 TABLET BY MOUTH EVERY 8 HOUR AS NEEDED FOR NAUSEA AND VOMITING  . rosuvastatin (CRESTOR) 40 MG tablet TAKE 1 TABLET BY MOUTH EVERY DAY  . sertraline (ZOLOFT) 100 MG tablet TAKE 1 TABLET BY MOUTH EVERY DAY  . TOUJEO SOLOSTAR 300 UNIT/ML Solostar Pen Inject 16 Units into the skin at bedtime.     Allergies:   Codeine   Social History   Socioeconomic History  . Marital status: Married    Spouse name: Not on file  . Number of children: 2  . Years of education: Not on file  .  Highest education level: Not on file  Occupational History  . Occupation: UNEMPLOYED    Fish farm manager: UNEMPLOYED    Comment: homemaker  Tobacco Use  . Smoking status: Former Smoker    Packs/day: 0.12    Years: 10.00    Pack years: 1.20    Types: Cigarettes    Quit date: 12/10/1990    Years since quitting: 30.0  . Smokeless tobacco: Never Used  Vaping Use  . Vaping Use: Never used  Substance and Sexual Activity  . Alcohol use: Never    Alcohol/week: 0.0 standard drinks  . Drug use: Yes    Types: Marijuana    Comment: last use 2 days ago  . Sexual activity: Not Currently  Other Topics Concern  . Not on file  Social History Narrative   Regular exercise- no    Diet- lots of mountain dew, limits fast foods    Social Determinants of Health   Financial Resource Strain: Not on file  Food Insecurity: Not on file  Transportation Needs: Not on file  Physical Activity: Not on file  Stress: Not on file  Social Connections: Not on file     Family History: The patient's family history includes Alzheimer's disease in her maternal grandmother; Coronary artery disease in her maternal grandmother; Diabetes in  her brother, maternal grandmother, and another family member; Heart attack in her maternal grandfather; Heart disease in an other family member; Heart failure in her brother; Heart failure (age of onset: 49) in her mother. There is no history of Colon polyps, Esophageal cancer, Liver cancer, Pancreatic cancer, Stomach cancer, Crohn's disease, or Rectal cancer.  ROS:   Please see the history of present illness.     All other systems reviewed and are negative.  EKGs/Labs/Other Studies Reviewed:    The following studies were reviewed today:  Echo 01/13/2019 1. The left ventricle has normal systolic function of 0000000. The cavity  size is normal. There is normal left ventricular wall thickness. The left  ventricular diastology could not be evaluated due to nondiagnostic images.  2. Normal left atrial size.  3. In comparison to the previous echocardiogram(s): The left ventricular  function has improved.    Myoview 07/20/2020  Nuclear stress EF: 64%.  The left ventricular ejection fraction is normal (55-65%).  There was no ST segment deviation noted during stress.  The study is normal.  This is a low risk study.  No ischemia.  Inferior and apical hypokinesis. Suspect that this is artifact. Consider echo to assess wall motion.    EKG:  EKG is ordered today.  The ekg ordered today demonstrates normal sinus rhythm, no significant ST-T wave changes  Recent Labs: 09/16/2020: Magnesium 1.8 12/08/2020: ALT 9; BUN 11; Creatinine, Ser 0.67; Hemoglobin 15.3; Platelets 423; Potassium 3.6; Sodium 139  Recent Lipid Panel    Component Value Date/Time   CHOL 197 01/27/2019 0809   TRIG 148.0 01/27/2019 0809   HDL 43.10 01/27/2019 0809   CHOLHDL 5 01/27/2019 0809   VLDL 29.6 01/27/2019 0809   LDLCALC 124 (H) 01/27/2019 0809   LDLDIRECT 206.0 07/27/2014 0745     Risk Assessment/Calculations:       Physical Exam:    VS:  BP 134/70 (BP Location: Left Arm, Patient Position: Sitting,  Cuff Size: Normal)   Pulse 94   Ht 4' 11.5" (1.511 m)   Wt 110 lb 6.4 oz (50.1 kg)   LMP 08/11/2012   BMI 21.92 kg/m     Wt Readings from Last  3 Encounters:  01/05/21 110 lb 6.4 oz (50.1 kg)  12/08/20 105 lb (47.6 kg)  11/21/20 112 lb (50.8 kg)     GEN:  Well nourished, well developed in no acute distress HEENT: Normal NECK: No JVD; No carotid bruits LYMPHATICS: No lymphadenopathy CARDIAC: RRR, no murmurs, rubs, gallops RESPIRATORY:  Clear to auscultation without rales, wheezing or rhonchi  ABDOMEN: Soft, non-tender, non-distended MUSCULOSKELETAL:  No edema; No deformity  SKIN: Warm and dry NEUROLOGIC:  Alert and oriented x 3 PSYCHIATRIC:  Normal affect   ASSESSMENT:    1. Orthostatic hypotension   2. Syncope and collapse   3. Hyperlipidemia, unspecified hyperlipidemia type    PLAN:    In order of problems listed above:  1. Orthostatic hypotension: She is wearing compression stocking and spandex.  Despite so, she continued to have significant drop in the blood pressure and recurrent episode of syncope upon standing.  I discussed the case with her primary cardiologist Dr. Percival Spanish, we will start him on midodrine 2.5 mg as needed daily for systolic blood pressure less than 110.  2. Recurrent syncope: Related to orthostatic hypotension and occurs upon standing  3. Hyperlipidemia: On Crestor  4. Insulin-dependent diabetes mellitus: Followed by primary care provider.      Medication Adjustments/Labs and Tests Ordered: Current medicines are reviewed at length with the patient today.  Concerns regarding medicines are outlined above.  Orders Placed This Encounter  Procedures  . EKG 12-Lead   Meds ordered this encounter  Medications  . midodrine (PROAMATINE) 2.5 MG tablet    Sig: Take 1 tablet (2.5 mg total) by mouth daily as needed. PER BP AS DIRECTED <110    Dispense:  30 tablet    Refill:  6    Patient Instructions  Medication Instructions:  TAKE MIDODRINE  2.5MG  DAILY AS NEEDED *If you need a refill on your cardiac medications before your next appointment, please call your pharmacy*  Lab Work:   Testing/Procedures:  NONE    NONE  Special Instructions CHECK BLOOD PRESSURE UPON WAKING (BEFORE YOU STAND) IF >110 TAKE YOUR MIDODRINE. DO NOT TAKE IF <110  Follow-Up: Your next appointment:  2 month(s) In Person with Minus Breeding, MD OR IF UNAVAILABLE Dalvin Clipper PA-C  At [facility], you and your health needs are our priority.  As part of our continuing mission to provide you with exceptional heart care, we have created designated Provider Care Teams.  These Care Teams include your primary Cardiologist (physician) and Advanced Practice Providers (APPs -  Physician Assistants and Nurse Practitioners) who all work together to provide you with the care you need, when you need it.      Hilbert Corrigan, [place]  01/07/2021 11:46 AM    [place]

## 2021-01-05 NOTE — Patient Instructions (Signed)
Medication Instructions:  TAKE MIDODRINE 2.5MG  DAILY AS NEEDED *If you need a refill on your cardiac medications before your next appointment, please call your pharmacy*  Lab Work:   Testing/Procedures:  NONE    NONE  Special Instructions CHECK BLOOD PRESSURE UPON WAKING (BEFORE YOU STAND) IF >110 TAKE YOUR MIDODRINE. DO NOT TAKE IF <110  Follow-Up: Your next appointment:  2 month(s) In Person with Minus Breeding, MD OR IF UNAVAILABLE HAO MENG PA-C  At [facility], you and your health needs are our priority.  As part of our continuing mission to provide you with exceptional heart care, we have created designated Provider Care Teams.  These Care Teams include your primary Cardiologist (physician) and Advanced Practice Providers (APPs -  Physician Assistants and Nurse Practitioners) who all work together to provide you with the care you need, when you need it.

## 2021-01-07 ENCOUNTER — Encounter: Payer: Self-pay | Admitting: Physician Assistant

## 2021-02-14 ENCOUNTER — Encounter: Payer: Self-pay | Admitting: Cardiology

## 2021-02-15 ENCOUNTER — Other Ambulatory Visit: Payer: Self-pay | Admitting: Family Medicine

## 2021-02-15 NOTE — Telephone Encounter (Signed)
Last office visit 09/16/2020 for hospital follow up.  Last refilled 11/10/2020 for #30 with 1 refill.  Next Appt: 02/20/2021 for stomach issues.

## 2021-02-20 ENCOUNTER — Ambulatory Visit: Payer: 59 | Admitting: Family Medicine

## 2021-02-20 DIAGNOSIS — Z0289 Encounter for other administrative examinations: Secondary | ICD-10-CM

## 2021-03-01 ENCOUNTER — Other Ambulatory Visit (INDEPENDENT_AMBULATORY_CARE_PROVIDER_SITE_OTHER): Payer: 59

## 2021-03-01 ENCOUNTER — Encounter: Payer: Self-pay | Admitting: Nurse Practitioner

## 2021-03-01 ENCOUNTER — Ambulatory Visit: Payer: 59 | Admitting: Nurse Practitioner

## 2021-03-01 VITALS — BP 102/60 | HR 96 | Ht 59.5 in | Wt 100.0 lb

## 2021-03-01 DIAGNOSIS — R1030 Lower abdominal pain, unspecified: Secondary | ICD-10-CM | POA: Diagnosis not present

## 2021-03-01 DIAGNOSIS — K3184 Gastroparesis: Secondary | ICD-10-CM

## 2021-03-01 DIAGNOSIS — K219 Gastro-esophageal reflux disease without esophagitis: Secondary | ICD-10-CM

## 2021-03-01 DIAGNOSIS — R112 Nausea with vomiting, unspecified: Secondary | ICD-10-CM | POA: Diagnosis not present

## 2021-03-01 DIAGNOSIS — E1143 Type 2 diabetes mellitus with diabetic autonomic (poly)neuropathy: Secondary | ICD-10-CM | POA: Diagnosis not present

## 2021-03-01 LAB — COMPREHENSIVE METABOLIC PANEL
ALT: 8 U/L (ref 0–35)
AST: 7 U/L (ref 0–37)
Albumin: 4.2 g/dL (ref 3.5–5.2)
Alkaline Phosphatase: 50 U/L (ref 39–117)
BUN: 10 mg/dL (ref 6–23)
CO2: 29 mEq/L (ref 19–32)
Calcium: 9.1 mg/dL (ref 8.4–10.5)
Chloride: 96 mEq/L (ref 96–112)
Creatinine, Ser: 0.67 mg/dL (ref 0.40–1.20)
GFR: 96.86 mL/min (ref >60.00)
Glucose, Bld: 255 mg/dL — ABNORMAL HIGH (ref 70–99)
Potassium: 4.1 mEq/L (ref 3.5–5.1)
Sodium: 132 mEq/L — ABNORMAL LOW (ref 135–145)
Total Bilirubin: 0.5 mg/dL (ref 0.2–1.2)
Total Protein: 6.5 g/dL (ref 6.0–8.3)

## 2021-03-01 LAB — CBC WITH DIFFERENTIAL/PLATELET
Basophils Absolute: 0.1 10*3/uL (ref 0.0–0.1)
Basophils Relative: 0.5 % (ref 0.0–3.0)
Eosinophils Absolute: 0.1 10*3/uL (ref 0.0–0.7)
Eosinophils Relative: 0.8 % (ref 0.0–5.0)
HCT: 48 % — ABNORMAL HIGH (ref 36.0–46.0)
Hemoglobin: 16.6 g/dL — ABNORMAL HIGH (ref 12.0–15.0)
Lymphocytes Relative: 40.6 % (ref 12.0–46.0)
Lymphs Abs: 4.4 10*3/uL — ABNORMAL HIGH (ref 0.7–4.0)
MCHC: 34.6 g/dL (ref 30.0–36.0)
MCV: 85.6 fl (ref 78.0–100.0)
Monocytes Absolute: 0.6 10*3/uL (ref 0.1–1.0)
Monocytes Relative: 6 % (ref 3.0–12.0)
Neutro Abs: 5.6 10*3/uL (ref 1.4–7.7)
Neutrophils Relative %: 52.1 % (ref 43.0–77.0)
Platelets: 381 10*3/uL (ref 150.0–400.0)
RBC: 5.6 Mil/uL — ABNORMAL HIGH (ref 3.87–5.11)
RDW: 15 % (ref 11.5–15.5)
WBC: 10.8 10*3/uL — ABNORMAL HIGH (ref 4.0–10.5)

## 2021-03-01 LAB — HIGH SENSITIVITY CRP: CRP, High Sensitivity: 1.51 mg/L (ref 0.000–5.000)

## 2021-03-01 MED ORDER — DICYCLOMINE HCL 10 MG PO CAPS: 10.0000 mg | ORAL_CAPSULE | Freq: Two times a day (BID) | ORAL | 0 refills | Status: DC | PRN

## 2021-03-01 NOTE — Patient Instructions (Signed)
You have been scheduled for a CT scan of the abdomen and pelvis at [facility] ([address]---this is in the same building as [employer]).   You are scheduled on 03/02/2021 at 1:00pm. You should arrive 15 minutes prior to your appointment time for registration. Please follow the written instructions below on the day of your exam:  WARNING: IF YOU ARE ALLERGIC TO IODINE/X-RAY DYE, PLEASE NOTIFY RADIOLOGY IMMEDIATELY AT [phone]! YOU WILL BE GIVEN A 13 HOUR PREMEDICATION PREP.  1) Do not eat or drink anything after 9am (4 hours prior to your test) 2) You have been given 2 bottles of oral contrast to drink. The solution may taste better if refrigerated, but do NOT add ice or any other liquid to this solution. Shake well before drinking.    Drink 1 bottle of contrast @ 11am (2 hours prior to your exam)  Drink 1 bottle of contrast @ 12pm (1 hour prior to your exam)  You may take any medications as prescribed with a small amount of water, if necessary. If you take any of the following medications: METFORMIN, GLUCOPHAGE, GLUCOVANCE, AVANDAMET, RIOMET, FORTAMET, [place] MET, JANUMET, GLUMETZA or METAGLIP, you MAY be asked to HOLD this medication 48 hours AFTER the exam.  The purpose of you drinking the oral contrast is to aid in the visualization of your intestinal tract. The contrast solution may cause some diarrhea. Depending on your individual set of symptoms, you may also receive an intravenous injection of x-ray contrast/dye. Plan on being at [facility] for 30 minutes or longer, depending on the type of exam you are having performed.  This test typically takes 30-45 minutes to complete.  If you have any questions regarding your exam or if you need to reschedule, you may call the CT department at [phone] between the hours of 8:00 am and 5:00 pm, Monday-Friday.    Your provider has requested that you go to the basement level for lab work before leaving  today. Press "B" on the elevator. The lab is located at the first door on the left as you exit the elevator. ( You will also pick up Hemoccult cards in the lab today)   ________________________________________________________________________   Continue Pantoprazole 40 mg twice a day  We will send Dicyclomine to your pharmacy  Due to recent changes in healthcare laws, you may see the results of your imaging and laboratory studies on MyChart before your provider has had a chance to review them.  We understand that in some cases there may be results that are confusing or concerning to you. Not all laboratory results come back in the same time frame and the provider may be waiting for multiple results in order to interpret others.  Please give [place] 48 hours in order for your provider to thoroughly review all the results before contacting the office for clarification of your results.   I appreciate the  opportunity to care for you  Thank You   [facility]

## 2021-03-01 NOTE — Progress Notes (Unsigned)
03/02/2021 [name] [name] [id number] 26-Jul-1961   Chief Complaint: lower abdominal pain  History of Present Illness: [name] [name] is a 60 year old female with a past medical history of anxiety, hypertension, CHF, DM, DKA, CKD, peripheral neuropathy, hypothyroidism, esophagitis, H. Pylor in 2018, duodenal ulcer, Schatzki's ring, dysphagia and chronic nausea.   She underwent a virtual visit with Dr. Ardis Hughs on 04/29/2019. At that time, she continued to have chronic nausea thought to be due to gastroparesis and marijuana use. She was advised to take Reglan prior to breakfast and dinner and to reduce her marijuana use. She underwent a screening colonoscopy 05/25/2019 which showed diverticulosis throughout the entire colon other wise was normal. A recall colonoscopy in 10 years was recommended.   She presents to our office today for further evaluation regarding lower abdominal pain which has progressively worsened over the past 1-1/2 months.  Eating any food worsens her lower abdominal pain.  She is passing a hard formed to watery dark brown to black bowel movement every 2 to 4 days.  No Pepto-Bismol or oral iron use.  No bright red rectal bleeding.  He is taking Metamucil daily.  She has nausea in the morning at least 3 days weekly.  She vomits clear emesis once or twice weekly.  No hematemesis.  She remains on Reglan 5 mg before every meal and nightly.  She is taking Phenergan once or twice weekly usually at bedtime.  She takes aspirin 81 mg daily.  No other NSAID use.  No alcohol use.  She stopped smoking marijuana 4 weeks ago.  She stated her vomiting episodes have actually decreased over the past month.  She reports losing 15 pounds over the past 1-1/2 months.  She reports being compliant with her insulin and diabetes management.  She is tolerating soup, chicken, yogurt and protein shakes.    CTAP 08/16/2020: 1. Circumferential thickening in the distal esophagus compatible with esophagitis. 2.  Marked distention of the stomach without an obstructing lesion. Question gastroparesis. Given similar appearance of gastric distension with previous duodenal distension, SMA syndrome is also considered. 3. Status post hysterectomy  EGD 08/17/2020: LA Grade D esophagitis with no bleeding. Biopsied. The likely souce for bleeding and CT scan findings. - Erosive gastropathy with stigmata of recent bleeding. Biopsied - Erosive gastropathy with stigmata of recent bleeding. Biopsied. - Mild erythematous duodenopathy. - The examination was otherwise normal. A. STOMACH, RANDOM, BIOPSY:  - Mild reactive gastropathy with focal intestinal metaplasia.  - Warthin-Starry is negative for Helicobacter pylori.  - No dysplasia, or malignancy.  B. ESOPHAGUS, DISTAL, BIOPSY:  - Acute esophagitis with ulcer debris.  The biopsies of your esophagus confirmed esophagitis and ulceration.  Pantoprazole twice daily for at least 3 months. Using Carafate up to 4 times daily for the next two weeks.  Small bowel enteroscopy 06/03/2019: - LA Grade D esophagitis. Biopsied. - Normal stomach. Biopsied. - Non-bleeding duodenal ulcers with no stigmata of bleeding. Biopsied. - The examined portion of the jejunum was normal.  Colonoscopy 05/25/2019: - Diverticulosis in the entire examined colon. - The examination was otherwise normal on direct and retroflexion views. - No polyps or cancers.  Past Medical History:  Diagnosis Date  . Allergy    seasonal  . Anxiety   . B12 deficiency   . Chest pain 04/05/2017  . CHF (congestive heart failure) ([place])   . Diabetes mellitus type 1, uncontrolled ([place])    "dr. just changed me to type 1, uncontrolled" (11/26/2018)  .  DKA (diabetic ketoacidoses) 05/25/2018  . GERD (gastroesophageal reflux disease)   . Hyperlipidemia   . Hypertension   . Hypothyroidism   . IBS (irritable bowel syndrome)   . Intermittent vertigo   . 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 Leg cramping    "@ night" (09/18/2013)  . Migraine    "once q couple months" (11/26/2018)  . Osteoporosis   . Peripheral neuropathy   . Umbilical hernia    unrepaired (09/18/2013)   Past Surgical History:  Procedure Laterality Date  . BIOPSY  06/03/2019   Procedure: BIOPSY;  Surgeon: Jackquline Denmark, MD;  Location: [facility];  Service: Endoscopy;;  . BIOPSY  08/17/2020   Procedure: BIOPSY;  Surgeon: Thornton Park, MD;  Location: [facility];  Service: Gastroenterology;;  . [place]  . ENTEROSCOPY N/A 06/03/2019   Procedure: ENTEROSCOPY;  Surgeon: Jackquline Denmark, MD;  Location: [facility];  Service: Endoscopy;  Laterality: N/A;  . ESOPHAGOGASTRODUODENOSCOPY (EGD) WITH PROPOFOL N/A 08/17/2020   Procedure: ESOPHAGOGASTRODUODENOSCOPY (EGD) WITH PROPOFOL;  Surgeon: Thornton Park, MD;  [facility];  Service: Gastroenterology;  Laterality: N/A;  . LAPAROSCOPIC ASSISTED VAGINAL HYSTERECTOMY  09/23/2012   Procedure: LAPAROSCOPIC ASSISTED VAGINAL HYSTERECTOMY;  Surgeon: Linda Hedges, DO;  Location: [facility];  Service: Gynecology;  Laterality: N/A;  pull Dr Gregor Hams instrument  . LEFT HEART CATH AND CORONARY ANGIOGRAPHY N/A 02/11/2017   Procedure: Left Heart Cath and Coronary Angiography;  Surgeon: Lorretta Harp, MD;  Location: [facility];  Service: Cardiovascular;  Laterality: N/A;  . TUBAL LIGATION Bilateral 1992   Current Outpatient Medications on File Prior to Visit  Medication Sig Dispense Refill  . butalbital-acetaminophen-caffeine (FIORICET) 50-325-40 MG tablet Take 1 tablet by mouth every 6 (six) hours as needed for headache. 20 tablet 0  . fexofenadine (ALLEGRA) 180 MG tablet Take 1 tablet (180 mg total) by mouth daily. 90 tablet 3  . insulin lispro (HUMALOG KWIKPEN) 100 UNIT/ML KiwkPen Inject into skin 3 times a day at meal time according to the following sliding scale: CBG 70-120 - 0 units / CBG 121-150 - 1 unit / CBG 151-200 - 2  units / CBG 201-250 - 3 units / CBG 251-300 - 5 units / CBG 301-350 - 7 units / CBG 351-400+ - 9 units  YOU MUST FOLLOW A STRICT DIABETIC DIET (Patient taking differently: Inject into the skin See admin instructions. 8 units plus sliding scale Inject into skin 3 times a day at meal time according to the following sliding scale: CBG 70-120 - 0 units / CBG 121-150 - 1 unit / CBG 151-200 - 2 units / CBG 201-250 - 3 units / CBG 251-300 - 5 units / CBG 301-350 - 7 units / CBG 351-400+ - 9 units  YOU MUST FOLLOW A STRICT DIABETIC DIET) 4 pen 11  . Insulin Pen Needle (PEN NEEDLES) 31G X 8 MM MISC Use to inject insulin 4 times a day.  Dx: E11.10 130 each 11  . Lancets (ONETOUCH ULTRASOFT) lancets Use to check blood sugar daily as needed.  Dx: E11.10 100 each 3  . levothyroxine (SYNTHROID) 175 MCG tablet Take 175 mcg by mouth daily.    . metoCLOPramide (REGLAN) 5 MG tablet Take 5mg  before breakfast lunch and dinner and 10mg  at bedtime 150 tablet 11  . metoprolol succinate (TOPROL-XL) 25 MG 24 hr tablet Take 1 tablet (25 mg total) by mouth daily. 90 tablet 2  . midodrine (PROAMATINE) 2.5 MG  tablet Take 1 tablet (2.5 mg total) by mouth daily as needed. PER BP AS DIRECTED <110 30 tablet 6  . pantoprazole (PROTONIX) 40 MG tablet Take 1 tablet (40 mg total) by mouth 2 (two) times daily before a meal. 60 tablet 3  . promethazine (PHENERGAN) 25 MG tablet TAKE 1 TABLET BY MOUTH EVERY 8 HOUR AS NEEDED FOR NAUSEA AND VOMITING 30 tablet 1  . rosuvastatin (CRESTOR) 40 MG tablet TAKE 1 TABLET BY MOUTH EVERY DAY 90 tablet 3  . sertraline (ZOLOFT) 100 MG tablet TAKE 1 TABLET BY MOUTH EVERY DAY 90 tablet 1  . TOUJEO SOLOSTAR 300 UNIT/ML Solostar Pen Inject 16 Units into the skin at bedtime.     No current facility-administered medications on file prior to visit.   Allergies  Allergen Reactions  . Codeine Hives, Anxiety and Other (See Comments)    Current Medications, Allergies, Past Medical History, Past  Surgical History, Family History and Social History were reviewed in [employer] record.  Review of Systems:   Constitutional: Negative for fever, sweats, chills or weight loss.  Respiratory: Negative for shortness of breath.   Cardiovascular: Negative for chest pain, palpitations and leg swelling.  Gastrointestinal: See HPI.  Musculoskeletal: Negative for back pain or muscle aches.  Neurological: Negative for dizziness, headaches or paresthesias.   Physical Exam: BP 102/60   Pulse 96   Ht 4' 11.5" (1.511 m)   Wt 100 lb (45.4 kg)   LMP 08/11/2012   SpO2 98%   BMI 19.86 kg/m   Wt Readings from Last 3 Encounters:  03/01/21 100 lb (45.4 kg)  01/05/21 110 lb 6.4 oz (50.1 kg)  12/08/20 105 lb (47.6 kg)   General: 60 year old female appears fatigued in no acute distress. Head: Normocephalic and atraumatic. Eyes: No scleral icterus. Conjunctiva pink . Ears: Normal auditory acuity. Mouth: Few remaining lower teeth with gross decay,  no ulcers or lesions.  Lungs: Clear throughout to auscultation. Heart: Regular rate and rhythm, no murmur. Abdomen: Soft, nondistended. Moderate lower abdominal tenderness without rebound or guarding.  No masses or hepatomegaly. Normal bowel sounds x 4 quadrants.  Rectal: Smear of brown stool guaiac negative.  Musculoskeletal: Symmetrical with no gross deformities. Extremities: No edema. Neurological: Alert oriented x 4. No focal deficits.  Psychological: Alert and cooperative. Normal mood and affect  Assessment and Recommendations:  62.  60 year old female with diverticulosis presents with lower abdominal pain with weight loss.  -CBC, CMP -CTAP with oral and IV contrast to rule out diverticulitis/colitis.  BUN/creatinine level to be reviewed prior to patient proceeding with CTAP. -Dicyclomine 10 mg p.o. twice daily -Patient to call our office if symptoms worsen -Further follow-up to be determined after the above evaluation  completed  2.  GERD -Continue Pantoprazole 40 mg twice daily  3. Chronic nausea, likely gastroparesis.  No marijuana use for the past 4 weeks. -Continue Reglan 5 mg p.o. before every meal and nightly -Continue Phenergan 25 mg 1 p.o. every 8 hours as needed  4. DM type I on insulin, poorly controlled

## 2021-03-02 ENCOUNTER — Other Ambulatory Visit: Payer: Self-pay

## 2021-03-02 ENCOUNTER — Ambulatory Visit (INDEPENDENT_AMBULATORY_CARE_PROVIDER_SITE_OTHER)
Admission: RE | Admit: 2021-03-02 | Discharge: 2021-03-02 | Disposition: A | Discharge status: Home / Self Care | Payer: 59 | Source: Ambulatory Visit | Attending: Nurse Practitioner | Admitting: Nurse Practitioner

## 2021-03-02 DIAGNOSIS — K219 Gastro-esophageal reflux disease without esophagitis: Secondary | ICD-10-CM

## 2021-03-02 DIAGNOSIS — R1030 Lower abdominal pain, unspecified: Secondary | ICD-10-CM

## 2021-03-02 DIAGNOSIS — E1143 Type 2 diabetes mellitus with diabetic autonomic (poly)neuropathy: Secondary | ICD-10-CM

## 2021-03-02 DIAGNOSIS — R112 Nausea with vomiting, unspecified: Secondary | ICD-10-CM

## 2021-03-02 DIAGNOSIS — K3184 Gastroparesis: Secondary | ICD-10-CM

## 2021-03-02 MED ORDER — IOHEXOL 300 MG/ML  SOLN
80.0000 mL | Freq: Once | INTRAMUSCULAR | Status: AC | PRN
  Administered 2021-03-02: 80 mL via INTRAVENOUS

## 2021-03-03 ENCOUNTER — Other Ambulatory Visit: Payer: Self-pay

## 2021-03-03 DIAGNOSIS — K838 Other specified diseases of biliary tract: Secondary | ICD-10-CM

## 2021-03-03 NOTE — Progress Notes (Signed)
I agree with the above note, plan 

## 2021-03-06 ENCOUNTER — Ambulatory Visit: Payer: 59 | Admitting: Cardiology

## 2021-03-06 DIAGNOSIS — I951 Orthostatic hypotension: Secondary | ICD-10-CM | POA: Insufficient documentation

## 2021-03-06 NOTE — Progress Notes (Signed)
Cardiology Office Note   Date:  03/07/2021   ID:  [name] [name], DOB 1961-10-31, MRN [id number]  PCP:  Jinny Sanders, MD  Cardiologist:   Minus Breeding, MD   Chief Complaint  Patient presents with  . Dizziness  . Fatigue      History of Present Illness: [name] [name] is a 60 y.o. female who presents for follow up of a non ischemic cardiomyopathy.   She had normal coronaries on catheterization in 2012.  Repeat echocardiogram in 2018 revealed an EF of 35 to 40%.  Repeat cardiac catheterization demonstrated normal coronaries.  She was noted to have very poorly controlled diabetes.  She has been hospitalized on several occasions due to DKA.  (Most recently last week.  I reviewed these records for this visit.)   She has a history of medical noncompliance.  In July she was seen to follow up hypotension.    She had chest pain but a low risk perfusion study.  It is likely that her pain was related to her esophagitis and ulcers.   She had dizziness which I suggested was related to orthostatic hypotension.  ZIO patch did not demonstrate any arrhythmias.  She is wearing compression garments.  At the last visit she was started on midodrine.  She brings me some blood pressures that are better than previous.  She still has drops that she is standing and I see some recorded in the 14G systolic.  However, this seems to be improved and she is not having as much dizziness.  She is only been taking her midodrine as needed.  She denies any new chest discomfort but she has had a chronic discomfort and this has not changed.  She complains of continued fatigue.   Past Medical History:  Diagnosis Date  . Allergy    seasonal  . Anxiety   . B12 deficiency   . Chest pain 04/05/2017  . CHF (congestive heart failure) ([place])   . Diabetes mellitus type 1, uncontrolled ([place])    "dr. just changed me to type 1, uncontrolled" (11/26/2018)  . DKA (diabetic ketoacidoses) 05/25/2018  . GERD (gastroesophageal  reflux disease)   . Hyperlipidemia   . Hypertension   . Hypothyroidism   . IBS (irritable bowel syndrome)   . Intermittent vertigo   . 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 Leg cramping    "@ night" (09/18/2013)  . Migraine    "once q couple months" (11/26/2018)  . Osteoporosis   . Peripheral neuropathy   . Umbilical hernia    unrepaired (09/18/2013)    Past Surgical History:  Procedure Laterality Date  . BIOPSY  06/03/2019   Procedure: BIOPSY;  Surgeon: Jackquline Denmark, MD;  [facility];  Service: Endoscopy;;  . BIOPSY  08/17/2020   Procedure: BIOPSY;  Surgeon: Thornton Park, MD;  [facility];  Service: Gastroenterology;;  . [place]  . ENTEROSCOPY N/A 06/03/2019   Procedure: ENTEROSCOPY;  Surgeon: Jackquline Denmark, MD;  [facility];  Service: Endoscopy;  Laterality: N/A;  . ESOPHAGOGASTRODUODENOSCOPY (EGD) WITH PROPOFOL N/A 08/17/2020   Procedure: ESOPHAGOGASTRODUODENOSCOPY (EGD) WITH PROPOFOL;  Surgeon: Thornton Park, MD;  [facility];  Service: Gastroenterology;  Laterality: N/A;  . LAPAROSCOPIC ASSISTED VAGINAL HYSTERECTOMY  09/23/2012   Procedure: LAPAROSCOPIC ASSISTED VAGINAL HYSTERECTOMY;  Surgeon: Linda Hedges, DO;  Location: [facility];  Service: Gynecology;  Laterality: N/A;  pull Dr Gregor Hams instrument  . LEFT HEART CATH AND  CORONARY ANGIOGRAPHY N/A 02/11/2017   Procedure: Left Heart Cath and Coronary Angiography;  Surgeon: Lorretta Harp, MD;  Location: [facility];  Service: Cardiovascular;  Laterality: N/A;  . TUBAL LIGATION Bilateral 1992     Current Outpatient Medications  Medication Sig Dispense Refill  . butalbital-acetaminophen-caffeine (FIORICET) 50-325-40 MG tablet Take 1 tablet by mouth every 6 (six) hours as needed for headache. 20 tablet 0  . dicyclomine (BENTYL) 10 MG capsule Take 1 capsule (10 mg total) by mouth 2 (two) times daily as needed for spasms. 30 capsule 0   . fexofenadine (ALLEGRA) 180 MG tablet Take 1 tablet (180 mg total) by mouth daily. 90 tablet 3  . insulin lispro (HUMALOG KWIKPEN) 100 UNIT/ML KiwkPen Inject into skin 3 times a day at meal time according to the following sliding scale: CBG 70-120 - 0 units / CBG 121-150 - 1 unit / CBG 151-200 - 2 units / CBG 201-250 - 3 units / CBG 251-300 - 5 units / CBG 301-350 - 7 units / CBG 351-400+ - 9 units  YOU MUST FOLLOW A STRICT DIABETIC DIET (Patient taking differently: Inject into the skin See admin instructions. 8 units plus sliding scale Inject into skin 3 times a day at meal time according to the following sliding scale: CBG 70-120 - 0 units / CBG 121-150 - 1 unit / CBG 151-200 - 2 units / CBG 201-250 - 3 units / CBG 251-300 - 5 units / CBG 301-350 - 7 units / CBG 351-400+ - 9 units  YOU MUST FOLLOW A STRICT DIABETIC DIET) 4 pen 11  . Insulin Pen Needle (PEN NEEDLES) 31G X 8 MM MISC Use to inject insulin 4 times a day.  Dx: E11.10 130 each 11  . Lancets (ONETOUCH ULTRASOFT) lancets Use to check blood sugar daily as needed.  Dx: E11.10 100 each 3  . levothyroxine (SYNTHROID) 175 MCG tablet Take 175 mcg by mouth daily.    . metoCLOPramide (REGLAN) 5 MG tablet Take 5mg  before breakfast lunch and dinner and 10mg  at bedtime 150 tablet 11  . metoprolol succinate (TOPROL-XL) 25 MG 24 hr tablet Take 1 tablet (25 mg total) by mouth daily. 90 tablet 2  . pantoprazole (PROTONIX) 40 MG tablet Take 1 tablet (40 mg total) by mouth 2 (two) times daily before a meal. 60 tablet 3  . promethazine (PHENERGAN) 25 MG tablet TAKE 1 TABLET BY MOUTH EVERY 8 HOUR AS NEEDED FOR NAUSEA AND VOMITING 30 tablet 1  . rosuvastatin (CRESTOR) 40 MG tablet TAKE 1 TABLET BY MOUTH EVERY DAY 90 tablet 3  . sertraline (ZOLOFT) 100 MG tablet TAKE 1 TABLET BY MOUTH EVERY DAY 90 tablet 1  . TOUJEO SOLOSTAR 300 UNIT/ML Solostar Pen Inject 16 Units into the skin at bedtime.    . Continuous Blood Gluc Transmit (DEXCOM G6  TRANSMITTER) MISC USE AS DIRECTED FOR CONTINUOUS GLUCOSE MONITORING. REUSE TRANSMITTER X 90DAYS THEN DISCARD & REPLACE    . gabapentin (NEURONTIN) 300 MG capsule Take 300 mg by mouth 3 (three) times daily.    . midodrine (PROAMATINE) 2.5 MG tablet Take 1 tablet (2.5 mg total) by mouth 2 (two) times daily with a meal. 60 tablet 6   No current facility-administered medications for this visit.    Allergies:   Codeine    ROS:  Please see the history of present illness.   Otherwise, review of systems are positive for none.   All other systems are reviewed and negative.  PHYSICAL EXAM: VS:  BP 100/71 (BP Location: Left Arm, Patient Position: Sitting)   Pulse 91   Ht 4' 11.5" (1.511 m)   Wt 104 lb 3.2 oz (47.3 kg)   LMP 08/11/2012   SpO2 98%   BMI 20.69 kg/m  , BMI Body mass index is 20.69 kg/m.  GENERAL:  Well appearing NECK:  No jugular venous distention, waveform within normal limits, carotid upstroke brisk and symmetric, no bruits, no thyromegaly LUNGS:  Clear to auscultation bilaterally CHEST:  Unremarkable HEART:  PMI not displaced or sustained,S1 and S2 within normal limits, no S3, no S4, no clicks, no rubs, no murmurs ABD:  Flat, positive bowel sounds normal in frequency in pitch, no bruits, no rebound, no guarding, no midline pulsatile mass, no hepatomegaly, no splenomegaly EXT:  2 plus pulses throughout, no edema, no cyanosis no clubbing   EKG:  EKG is  ordered today. Rhythm, rate 91, axis within normal limits, intervals within normal limits, no acute ST-T wave changes.   Recent Labs: 09/16/2020: Magnesium 1.8 03/01/2021: ALT 8; BUN 10; Creatinine, Ser 0.67; Hemoglobin 16.6; Platelets 381.0; Potassium 4.1; Sodium 132    Lipid Panel    Component Value Date/Time   CHOL 197 01/27/2019 0809   TRIG 148.0 01/27/2019 0809   HDL 43.10 01/27/2019 0809   CHOLHDL 5 01/27/2019 0809   VLDL 29.6 01/27/2019 0809   LDLCALC 124 (H) 01/27/2019 0809   LDLDIRECT 206.0 07/27/2014 0745       Wt Readings from Last 3 Encounters:  03/07/21 104 lb 3.2 oz (47.3 kg)  03/01/21 100 lb (45.4 kg)  01/05/21 110 lb 6.4 oz (50.1 kg)      Other studies Reviewed: Additional studies/ records that were reviewed today include:   Labs Review of the above records demonstrates:    See elsewhere   ASSESSMENT AND PLAN:  Hypotension:    I am going to increase her midodrine to 2.5 mg scheduled twice daily.  She can take it at 7 AM and will now.  She might need a third dose and we will see over time.  She could also have a dose increase.   Ongoing chest pressure:   This is change from previous.  She had a negative perfusion study.  This does not sound anginal.  She has other etiologies.  No change in therapy  Insulin-dependent diabetes:    A1c is 13.3.  She might have converted to type 1 diabetes she tells me.  She is being followed by an endocrinologist.   Hyperlipidemia:   LDL was most recently 124.  I am going to have her repeat a lipid profile.  If she is not at least below 100 she might need to take Zetia.     Current medicines are reviewed at length with the patient today.  The patient does not have concerns regarding medicines.  The following changes have been made: As above  Labs/ tests ordered today include:   Orders Placed This Encounter  Procedures  . Lipid panel  . Hepatic function panel  . EKG 12-Lead     Disposition:   FU with Almyra Deforest PA in 4 months.   Signed, Minus Breeding, MD  03/07/2021 10:52 AM    [place]

## 2021-03-07 ENCOUNTER — Encounter: Payer: Self-pay | Admitting: Cardiology

## 2021-03-07 ENCOUNTER — Ambulatory Visit (INDEPENDENT_AMBULATORY_CARE_PROVIDER_SITE_OTHER): Payer: 59 | Admitting: Cardiology

## 2021-03-07 ENCOUNTER — Other Ambulatory Visit: Payer: Self-pay

## 2021-03-07 VITALS — BP 100/71 | HR 91 | Ht 59.5 in | Wt 104.2 lb

## 2021-03-07 DIAGNOSIS — I951 Orthostatic hypotension: Secondary | ICD-10-CM

## 2021-03-07 DIAGNOSIS — R0789 Other chest pain: Secondary | ICD-10-CM

## 2021-03-07 DIAGNOSIS — E785 Hyperlipidemia, unspecified: Secondary | ICD-10-CM

## 2021-03-07 DIAGNOSIS — Z79899 Other long term (current) drug therapy: Secondary | ICD-10-CM | POA: Diagnosis not present

## 2021-03-07 MED ORDER — MIDODRINE HCL 2.5 MG PO TABS: 2.5000 mg | ORAL_TABLET | Freq: Two times a day (BID) | ORAL | 6 refills | Status: DC

## 2021-03-07 NOTE — Patient Instructions (Addendum)
Medication Instructions:  INCREASE- Midodrine 2.5 mg by mouth twice a day  *If you need a refill on your cardiac medications before your next appointment, please call your pharmacy*   Lab Work: Fasting Lipid and Liver  If you have labs (blood work) drawn today and your tests are completely normal, you will receive your results only by: Marland Kitchen MyChart Message (if you have MyChart) OR . A paper copy in the mail If you have any lab test that is abnormal or we need to change your treatment, we will call you to review the results.   Testing/Procedures: None Ordered   Follow-Up: At [facility], you and your health needs are our priority.  As part of our continuing mission to provide you with exceptional heart care, we have created designated Provider Care Teams.  These Care Teams include your primary Cardiologist (physician) and Advanced Practice Providers (APPs -  Physician Assistants and Nurse Practitioners) who all work together to provide you with the care you need, when you need it.  We recommend signing up for the patient portal called "MyChart".  Sign up information is provided on this After Visit Summary.  MyChart is used to connect with patients for Virtual Visits (Telemedicine).  Patients are able to view lab/test results, encounter notes, upcoming appointments, etc.  Non-urgent messages can be sent to your provider as well.   To learn more about what you can do with MyChart, go to [email or web address].    Your next appointment:   4 month(s)  The format for your next appointment:   In Person  Provider:    Almyra Deforest

## 2021-03-08 ENCOUNTER — Other Ambulatory Visit: Payer: Self-pay | Admitting: Nurse Practitioner

## 2021-03-08 NOTE — Telephone Encounter (Signed)
Please advise on refill, we sent this in one week ago.

## 2021-03-09 ENCOUNTER — Ambulatory Visit: Payer: 59 | Admitting: Family Medicine

## 2021-03-09 ENCOUNTER — Other Ambulatory Visit: Payer: Self-pay

## 2021-03-09 VITALS — BP 100/60 | HR 96 | Temp 97.8°F | Ht 59.5 in | Wt 101.8 lb

## 2021-03-09 DIAGNOSIS — N3289 Other specified disorders of bladder: Secondary | ICD-10-CM | POA: Diagnosis not present

## 2021-03-09 DIAGNOSIS — R300 Dysuria: Secondary | ICD-10-CM | POA: Diagnosis not present

## 2021-03-09 DIAGNOSIS — E871 Hypo-osmolality and hyponatremia: Secondary | ICD-10-CM

## 2021-03-09 DIAGNOSIS — F331 Major depressive disorder, recurrent, moderate: Secondary | ICD-10-CM | POA: Diagnosis not present

## 2021-03-09 DIAGNOSIS — E1149 Type 2 diabetes mellitus with other diabetic neurological complication: Secondary | ICD-10-CM

## 2021-03-09 DIAGNOSIS — E7800 Pure hypercholesterolemia, unspecified: Secondary | ICD-10-CM

## 2021-03-09 LAB — POC URINALSYSI DIPSTICK (AUTOMATED)
Bilirubin, UA: NEGATIVE
Blood, UA: NEGATIVE
Glucose, UA: POSITIVE — AB
Ketones, UA: NEGATIVE
Leukocytes, UA: NEGATIVE
Nitrite, UA: NEGATIVE
Protein, UA: POSITIVE — AB
Spec Grav, UA: 1.015 (ref 1.010–1.025)
Urobilinogen, UA: 0.2 E.U./dL
pH, UA: 6 (ref 5.0–8.0)

## 2021-03-09 MED ORDER — FLUTICASONE PROPIONATE 50 MCG/ACT NA SUSP: 2.0000 | Freq: Every day | NASAL | 6 refills | Status: DC

## 2021-03-09 NOTE — Assessment & Plan Note (Signed)
Poor control.. worsened with brother death. On sertraline 100 mg daily.  Referred back to psychiatry for med adjustment.

## 2021-03-09 NOTE — Assessment & Plan Note (Signed)
If adjusted given elevated glucose.. in normal range, but lower than usual.  Pt is on SSRI, poor diet, low salt intake, intermittent emesis could all contribute.  Re-eval with labs.

## 2021-03-09 NOTE — Progress Notes (Signed)
Patient ID: [name] [name], female    DOB: 04-Oct-1961, 60 y.o.   MRN: [id number]  This visit was conducted in person.  BP 100/60   Pulse 96   Temp 97.8 F (36.6 C) (Temporal)   Ht 4' 11.5" (1.511 m)   Wt 101 lb 12 oz (46.2 kg)   LMP 08/11/2012   SpO2 99%   BMI 20.21 kg/m    CC:  Chief Complaint  Patient presents with  . Follow-up    From Gastro CT     Subjective:   HPI: [name] [name] is a 60 y.o. female presenting on 03/09/2021 for Follow-up (From [facility] CT )  She has been followed recently by GI for chronic nausea, gastroparesis, emesis and low abd pain.Marland Kitchen last OV 03/01/2021( reviewed note in detail)  08/2020 EGD showed erosive gastropathy Colonoscopy 05/25/2019: - Diverticulosis in the entire examined colon. - The examination was otherwise normal on direct and retroflexion views. - No polyps or cancers.    On dicyclomine, raglan and phenergan.  CT Abd/pelvis on 03/02/2021:  Constipation.. miralax has helped. Showed mildly dilate common bile duct. ( Dr. Eugenia Pancoast plans repeat LFTs in 3 months) New mild diffuse bladder wall thickening... possible cystitis Small fat containing umbilical hernia  9/37/1696 CMET showed nml renal function, Na 132 Glucose 255.. she is followed by ENDO  Sodium adjusted for glucose 135.2  Also she has been vomiting 2 times a week... but not in weeks prior to labs. She has  Had some improvement in eating... still very limited, does not add salt. In last week she has noted intermittent dysuria, some urgency and frequency.  No fever, no flank pain.  She has been  having worsened mood issues and is requesting a referral back to psychiatry at [facility].   Relevant past medical, surgical, family and social history reviewed and updated as indicated. Interim medical history since our last visit reviewed. Allergies and medications reviewed and updated. Outpatient Medications Prior to Visit  Medication Sig Dispense Refill  .  butalbital-acetaminophen-caffeine (FIORICET) 50-325-40 MG tablet Take 1 tablet by mouth every 6 (six) hours as needed for headache. 20 tablet 0  . Continuous Blood Gluc Transmit (DEXCOM G6 TRANSMITTER) MISC USE AS DIRECTED FOR CONTINUOUS GLUCOSE MONITORING. REUSE TRANSMITTER X 90DAYS THEN DISCARD & REPLACE    . dicyclomine (BENTYL) 10 MG capsule Take 1 capsule (10 mg total) by mouth 2 (two) times daily as needed for spasms. 30 capsule 0  . fexofenadine (ALLEGRA) 180 MG tablet Take 1 tablet (180 mg total) by mouth daily. 90 tablet 3  . gabapentin (NEURONTIN) 300 MG capsule Take 300 mg by mouth 3 (three) times daily.    . insulin lispro (HUMALOG KWIKPEN) 100 UNIT/ML KiwkPen Inject into skin 3 times a day at meal time according to the following sliding scale: CBG 70-120 - 0 units / CBG 121-150 - 1 unit / CBG 151-200 - 2 units / CBG 201-250 - 3 units / CBG 251-300 - 5 units / CBG 301-350 - 7 units / CBG 351-400+ - 9 units  YOU MUST FOLLOW A STRICT DIABETIC DIET (Patient taking differently: Inject into the skin See admin instructions. 8 units plus sliding scale Inject into skin 3 times a day at meal time according to the following sliding scale: CBG 70-120 - 0 units / CBG 121-150 - 1 unit / CBG 151-200 - 2 units / CBG 201-250 - 3 units / CBG 251-300 - 5 units / CBG 301-350 - 7 units /  CBG 351-400+ - 9 units  YOU MUST FOLLOW A STRICT DIABETIC DIET) 4 pen 11  . Insulin Pen Needle (PEN NEEDLES) 31G X 8 MM MISC Use to inject insulin 4 times a day.  Dx: E11.10 130 each 11  . Lancets (ONETOUCH ULTRASOFT) lancets Use to check blood sugar daily as needed.  Dx: E11.10 100 each 3  . levothyroxine (SYNTHROID) 175 MCG tablet Take 175 mcg by mouth daily.    . metoCLOPramide (REGLAN) 5 MG tablet Take 22m before breakfast lunch and dinner and 118mat bedtime 150 tablet 11  . metoprolol succinate (TOPROL-XL) 25 MG 24 hr tablet Take 1 tablet (25 mg total) by mouth daily. 90 tablet 2  . midodrine (PROAMATINE)  2.5 MG tablet Take 1 tablet (2.5 mg total) by mouth 2 (two) times daily with a meal. 60 tablet 6  . pantoprazole (PROTONIX) 40 MG tablet Take 1 tablet (40 mg total) by mouth 2 (two) times daily before a meal. 60 tablet 3  . promethazine (PHENERGAN) 25 MG tablet TAKE 1 TABLET BY MOUTH EVERY 8 HOUR AS NEEDED FOR NAUSEA AND VOMITING 30 tablet 1  . rosuvastatin (CRESTOR) 40 MG tablet TAKE 1 TABLET BY MOUTH EVERY DAY 90 tablet 3  . sertraline (ZOLOFT) 100 MG tablet TAKE 1 TABLET BY MOUTH EVERY DAY 90 tablet 1  . TOUJEO SOLOSTAR 300 UNIT/ML Solostar Pen Inject 16 Units into the skin at bedtime.     No facility-administered medications prior to visit.     Per HPI unless specifically indicated in ROS section below Review of Systems  Constitutional: Positive for fatigue. Negative for chills and fever.  HENT: Negative for congestion and ear pain.   Eyes: Negative for pain and redness.  Respiratory: Negative for cough and shortness of breath.   Cardiovascular: Negative for chest pain, palpitations and leg swelling.  Gastrointestinal: Positive for abdominal pain, nausea and vomiting. Negative for blood in stool, constipation and diarrhea.  Genitourinary: Negative for dysuria.  Musculoskeletal: Negative for myalgias.  Skin: Negative for rash.  Neurological: Negative for dizziness.  Psychiatric/Behavioral: The patient is not nervous/anxious.    Objective:  BP 100/60   Pulse 96   Temp 97.8 F (36.6 C) (Temporal)   Ht 4' 11.5" (1.511 m)   Wt 101 lb 12 oz (46.2 kg)   LMP 08/11/2012   SpO2 99%   BMI 20.21 kg/m   Wt Readings from Last 3 Encounters:  03/09/21 101 lb 12 oz (46.2 kg)  03/07/21 104 lb 3.2 oz (47.3 kg)  03/01/21 100 lb (45.4 kg)      Physical Exam Constitutional:      General: She is not in acute distress.    Appearance: Normal appearance. She is well-developed. She is cachectic. She is not ill-appearing or toxic-appearing.  HENT:     Head: Normocephalic.     Right Ear:  Hearing, tympanic membrane, ear canal and external ear normal. Tympanic membrane is not erythematous, retracted or bulging.     Left Ear: Hearing, tympanic membrane, ear canal and external ear normal. Tympanic membrane is not erythematous, retracted or bulging.     Nose: No mucosal edema or rhinorrhea.     Right Sinus: No maxillary sinus tenderness or frontal sinus tenderness.     Left Sinus: No maxillary sinus tenderness or frontal sinus tenderness.     Mouth/Throat:     Pharynx: Uvula midline.  Eyes:     General: Lids are normal. Lids are everted, no foreign bodies appreciated.  Conjunctiva/sclera: Conjunctivae normal.     Pupils: Pupils are equal, round, and reactive to light.  Neck:     Thyroid: No thyroid mass or thyromegaly.     Vascular: No carotid bruit.     Trachea: Trachea normal.  Cardiovascular:     Rate and Rhythm: Normal rate and regular rhythm.     Pulses: Normal pulses.     Heart sounds: Normal heart sounds, S1 normal and S2 normal. No murmur heard. No friction rub. No gallop.   Pulmonary:     Effort: Pulmonary effort is normal. No tachypnea or respiratory distress.     Breath sounds: Normal breath sounds. No decreased breath sounds, wheezing, rhonchi or rales.  Abdominal:     General: Bowel sounds are normal.     Palpations: Abdomen is soft.     Tenderness: There is no abdominal tenderness.  Musculoskeletal:     Cervical back: Normal range of motion and neck supple.  Skin:    General: Skin is warm and dry.     Findings: No rash.  Neurological:     Mental Status: She is alert.  Psychiatric:        Mood and Affect: Mood is not anxious or depressed.        Speech: Speech normal.        Behavior: Behavior normal. Behavior is cooperative.        Thought Content: Thought content normal.        Judgment: Judgment normal.       Results for orders placed or performed in visit on 03/01/21  CRP High sensitivity  Result Value Ref Range   CRP, High Sensitivity  1.510 0.000 - 5.000 mg/L  Comp Met (CMET)  Result Value Ref Range   Sodium 132 (L) 135 - 145 mEq/L   Potassium 4.1 3.5 - 5.1 mEq/L   Chloride 96 96 - 112 mEq/L   CO2 29 19 - 32 mEq/L   Glucose, Bld 255 (H) 70 - 99 mg/dL   BUN 10 6 - 23 mg/dL   Creatinine, Ser 0.67 0.40 - 1.20 mg/dL   Total Bilirubin 0.5 0.2 - 1.2 mg/dL   Alkaline Phosphatase 50 39 - 117 U/L   AST 7 0 - 37 U/L   ALT 8 0 - 35 U/L   Total Protein 6.5 6.0 - 8.3 g/dL   Albumin 4.2 3.5 - 5.2 g/dL   GFR 96.86 >60.00 mL/min   Calcium 9.1 8.4 - 10.5 mg/dL  CBC w/Diff  Result Value Ref Range   WBC 10.8 (H) 4.0 - 10.5 K/uL   RBC 5.60 (H) 3.87 - 5.11 Mil/uL   Hemoglobin 16.6 (H) 12.0 - 15.0 g/dL   HCT 48.0 (H) 36.0 - 46.0 %   MCV 85.6 78.0 - 100.0 fl   MCHC 34.6 30.0 - 36.0 g/dL   RDW 15.0 11.5 - 15.5 %   Platelets 381.0 150.0 - 400.0 K/uL   Neutrophils Relative % 52.1 43.0 - 77.0 %   Lymphocytes Relative 40.6 12.0 - 46.0 %   Monocytes Relative 6.0 3.0 - 12.0 %   Eosinophils Relative 0.8 0.0 - 5.0 %   Basophils Relative 0.5 0.0 - 3.0 %   Neutro Abs 5.6 1.4 - 7.7 K/uL   Lymphs Abs 4.4 (H) 0.7 - 4.0 K/uL   Monocytes Absolute 0.6 0.1 - 1.0 K/uL   Eosinophils Absolute 0.1 0.0 - 0.7 K/uL   Basophils Absolute 0.1 0.0 - 0.1 K/uL    This visit occurred  during the SARS-CoV-2 public health emergency.  Safety protocols were in place, including screening questions prior to the visit, additional usage of staff PPE, and extensive cleaning of exam room while observing appropriate contact time as indicated for disinfecting solutions.   COVID 19 screen:  No recent travel or known exposure to COVID19 The patient denies respiratory symptoms of COVID 19 at this time. The importance of social distancing was discussed today.   Assessment and Plan    Problem List Items Addressed This Visit    Bladder wall thickening    UA clear today, but with dysuria, no blood in urine. Send for culture. If negative refer to Urology to eval further  if necessary.      Hyponatremia - Primary    If adjusted given elevated glucose.. in normal range, but lower than usual.  Pt is on SSRI, poor diet, low salt intake, intermittent emesis could all contribute.  Re-eval with labs.      Relevant Orders   Comprehensive metabolic panel   MDD (major depressive disorder), recurrent episode, moderate ([name]) (Chronic)    Poor control.. worsened with brother death. On sertraline 100 mg daily.  Referred back to psychiatry for med adjustment.       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 hopefully improving mood with help with motivation and self care.       Other Visit Diagnoses    Dysuria       Relevant Orders   POCT Urinalysis Dipstick (Automated) (Completed)   Urine Culture   Hypercholesteremia       Relevant Orders   Lipid panel       Eliezer Lofts, MD

## 2021-03-09 NOTE — Assessment & Plan Note (Signed)
CBGs some improved with improved compliance... hopefully improving mood with help with motivation and self care.

## 2021-03-09 NOTE — Patient Instructions (Addendum)
We will call with referral to psychiatry.  Work on increasing protein in diet.  Start flonase 2 sprays per nostril for sinus pressure/headache/ allergies.  Return for fasting cholesterol and CMET check... here or with cardiology.  We will call with urine culture results.

## 2021-03-09 NOTE — Assessment & Plan Note (Signed)
UA clear today, but with dysuria, no blood in urine. Send for culture. If negative refer to Urology to eval further if necessary.

## 2021-03-11 LAB — URINE CULTURE
MICRO NUMBER:: 11716394
SPECIMEN QUALITY:: ADEQUATE

## 2021-03-16 ENCOUNTER — Other Ambulatory Visit: Payer: Self-pay | Admitting: Family Medicine

## 2021-03-16 DIAGNOSIS — R9341 Abnormal radiologic findings on diagnostic imaging of renal pelvis, ureter, or bladder: Secondary | ICD-10-CM

## 2021-03-16 DIAGNOSIS — N3289 Other specified disorders of bladder: Secondary | ICD-10-CM

## 2021-04-04 ENCOUNTER — Other Ambulatory Visit: Payer: Self-pay | Admitting: Family Medicine

## 2021-04-04 NOTE — Telephone Encounter (Signed)
Last office visit 03/09/2021 for follow up from [place].  Last refilled 02/15/21 for #30 with 1 refill.  No future appointments.

## 2021-04-24 ENCOUNTER — Other Ambulatory Visit: Payer: Self-pay | Admitting: Nurse Practitioner

## 2021-06-11 ENCOUNTER — Other Ambulatory Visit: Payer: Self-pay | Admitting: Nurse Practitioner

## 2021-06-20 ENCOUNTER — Other Ambulatory Visit: Payer: Self-pay | Admitting: Family Medicine

## 2021-06-20 NOTE — Telephone Encounter (Signed)
Last office visit 03/09/2021 for follow up from [place].  Last refilled 04/04/2021 for #30 with 1 refill.  No future appointments.

## 2021-06-29 ENCOUNTER — Other Ambulatory Visit: Payer: Self-pay | Admitting: Nurse Practitioner

## 2021-06-30 ENCOUNTER — Emergency Department (HOSPITAL_COMMUNITY)
Admission: EM | Admit: 2021-06-30 | Discharge: 2021-06-30 | Disposition: A | Discharge status: Home / Self Care | Payer: 59 | Attending: Emergency Medicine | Admitting: Emergency Medicine

## 2021-06-30 ENCOUNTER — Emergency Department (HOSPITAL_COMMUNITY): Payer: 59

## 2021-06-30 ENCOUNTER — Encounter (HOSPITAL_COMMUNITY): Payer: Self-pay | Admitting: Emergency Medicine

## 2021-06-30 DIAGNOSIS — Z794 Long term (current) use of insulin: Secondary | ICD-10-CM | POA: Insufficient documentation

## 2021-06-30 DIAGNOSIS — K2950 Unspecified chronic gastritis without bleeding: Secondary | ICD-10-CM | POA: Insufficient documentation

## 2021-06-30 DIAGNOSIS — N182 Chronic kidney disease, stage 2 (mild): Secondary | ICD-10-CM | POA: Insufficient documentation

## 2021-06-30 DIAGNOSIS — R197 Diarrhea, unspecified: Secondary | ICD-10-CM | POA: Insufficient documentation

## 2021-06-30 DIAGNOSIS — Z20822 Contact with and (suspected) exposure to covid-19: Secondary | ICD-10-CM | POA: Insufficient documentation

## 2021-06-30 DIAGNOSIS — E039 Hypothyroidism, unspecified: Secondary | ICD-10-CM | POA: Diagnosis not present

## 2021-06-30 DIAGNOSIS — E1010 Type 1 diabetes mellitus with ketoacidosis without coma: Secondary | ICD-10-CM | POA: Diagnosis not present

## 2021-06-30 DIAGNOSIS — E1049 Type 1 diabetes mellitus with other diabetic neurological complication: Secondary | ICD-10-CM | POA: Diagnosis not present

## 2021-06-30 DIAGNOSIS — Z955 Presence of coronary angioplasty implant and graft: Secondary | ICD-10-CM | POA: Diagnosis not present

## 2021-06-30 DIAGNOSIS — E1043 Type 1 diabetes mellitus with diabetic autonomic (poly)neuropathy: Secondary | ICD-10-CM | POA: Diagnosis not present

## 2021-06-30 DIAGNOSIS — I130 Hypertensive heart and chronic kidney disease with heart failure and stage 1 through stage 4 chronic kidney disease, or unspecified chronic kidney disease: Secondary | ICD-10-CM | POA: Diagnosis not present

## 2021-06-30 DIAGNOSIS — R6883 Chills (without fever): Secondary | ICD-10-CM | POA: Diagnosis not present

## 2021-06-30 DIAGNOSIS — R112 Nausea with vomiting, unspecified: Secondary | ICD-10-CM

## 2021-06-30 DIAGNOSIS — R61 Generalized hyperhidrosis: Secondary | ICD-10-CM | POA: Insufficient documentation

## 2021-06-30 DIAGNOSIS — R1084 Generalized abdominal pain: Secondary | ICD-10-CM

## 2021-06-30 DIAGNOSIS — Z79899 Other long term (current) drug therapy: Secondary | ICD-10-CM | POA: Insufficient documentation

## 2021-06-30 DIAGNOSIS — R0602 Shortness of breath: Secondary | ICD-10-CM | POA: Insufficient documentation

## 2021-06-30 DIAGNOSIS — I5032 Chronic diastolic (congestive) heart failure: Secondary | ICD-10-CM | POA: Diagnosis not present

## 2021-06-30 DIAGNOSIS — K219 Gastro-esophageal reflux disease without esophagitis: Secondary | ICD-10-CM | POA: Diagnosis not present

## 2021-06-30 DIAGNOSIS — Z87891 Personal history of nicotine dependence: Secondary | ICD-10-CM | POA: Diagnosis not present

## 2021-06-30 LAB — CBC WITH DIFFERENTIAL/PLATELET
Abs Immature Granulocytes: 0.07 10*3/uL (ref 0.00–0.07)
Basophils Absolute: 0.1 10*3/uL (ref 0.0–0.1)
Basophils Relative: 0 %
Eosinophils Absolute: 0.1 10*3/uL (ref 0.0–0.5)
Eosinophils Relative: 1 %
HCT: 47.9 % — ABNORMAL HIGH (ref 36.0–46.0)
Hemoglobin: 16.6 g/dL — ABNORMAL HIGH (ref 12.0–15.0)
Immature Granulocytes: 1 %
Lymphocytes Relative: 29 %
Lymphs Abs: 3.5 10*3/uL (ref 0.7–4.0)
MCH: 30.3 pg (ref 26.0–34.0)
MCHC: 34.7 g/dL (ref 30.0–36.0)
MCV: 87.6 fL (ref 80.0–100.0)
Monocytes Absolute: 0.8 10*3/uL (ref 0.1–1.0)
Monocytes Relative: 7 %
Neutro Abs: 7.3 10*3/uL (ref 1.7–7.7)
Neutrophils Relative %: 62 %
Platelets: 382 10*3/uL (ref 150–400)
RBC: 5.47 MIL/uL — ABNORMAL HIGH (ref 3.87–5.11)
RDW: 13.9 % (ref 11.5–15.5)
WBC: 11.8 10*3/uL — ABNORMAL HIGH (ref 4.0–10.5)
nRBC: 0 % (ref 0.0–0.2)

## 2021-06-30 LAB — CBG MONITORING, ED
Glucose-Capillary: 300 mg/dL — ABNORMAL HIGH (ref 70–99)
Glucose-Capillary: 297 mg/dL — ABNORMAL HIGH (ref 70–99)

## 2021-06-30 LAB — URINALYSIS, ROUTINE W REFLEX MICROSCOPIC
Bilirubin Urine: NEGATIVE
Glucose, UA: >=500 mg/dL — AB
Ketones, ur: 20 mg/dL — AB
Leukocytes,Ua: NEGATIVE
Nitrite: NEGATIVE
Protein, ur: NEGATIVE mg/dL
Specific Gravity, Urine: 1.027 (ref 1.005–1.030)
pH: 7 (ref 5.0–8.0)

## 2021-06-30 LAB — COMPREHENSIVE METABOLIC PANEL
ALT: 10 U/L (ref 0–44)
AST: 13 U/L — ABNORMAL LOW (ref 15–41)
Albumin: 3.9 g/dL (ref 3.5–5.0)
Alkaline Phosphatase: 58 U/L (ref 38–126)
Anion gap: 11 (ref 5–15)
BUN: 16 mg/dL (ref 6–20)
CO2: 27 mmol/L (ref 22–32)
Calcium: 9.3 mg/dL (ref 8.9–10.3)
Chloride: 96 mmol/L — ABNORMAL LOW (ref 98–111)
Creatinine, Ser: 0.67 mg/dL (ref 0.44–1.00)
GFR, Estimated: >60 mL/min (ref >60)
Glucose, Bld: 321 mg/dL — ABNORMAL HIGH (ref 70–99)
Potassium: 3.8 mmol/L (ref 3.5–5.1)
Sodium: 134 mmol/L — ABNORMAL LOW (ref 135–145)
Total Bilirubin: 0.9 mg/dL (ref 0.3–1.2)
Total Protein: 6.5 g/dL (ref 6.5–8.1)

## 2021-06-30 LAB — RAPID URINE DRUG SCREEN, HOSP PERFORMED
Amphetamines: NOT DETECTED
Barbiturates: NOT DETECTED
Benzodiazepines: NOT DETECTED
Cocaine: NOT DETECTED
Opiates: POSITIVE — AB
Tetrahydrocannabinol: POSITIVE — AB

## 2021-06-30 LAB — BRAIN NATRIURETIC PEPTIDE: B Natriuretic Peptide: 14.9 pg/mL (ref 0.0–100.0)

## 2021-06-30 LAB — RESP PANEL BY RT-PCR (FLU A&B, COVID) ARPGX2
Influenza A by PCR: NEGATIVE
Influenza B by PCR: NEGATIVE
SARS Coronavirus 2 by RT PCR: NEGATIVE

## 2021-06-30 LAB — LIPASE, BLOOD: Lipase: 24 U/L (ref 11–51)

## 2021-06-30 MED ORDER — MORPHINE SULFATE (PF) 4 MG/ML IV SOLN
4.0000 mg | Freq: Once | INTRAVENOUS | Status: AC
  Administered 2021-06-30: 4 mg via INTRAVENOUS
  Filled 2021-06-30: qty 1

## 2021-06-30 MED ORDER — ONDANSETRON HCL 4 MG PO TABS: 4.0000 mg | ORAL_TABLET | Freq: Four times a day (QID) | ORAL | 0 refills | Status: DC | PRN

## 2021-06-30 MED ORDER — IOHEXOL 300 MG/ML  SOLN
100.0000 mL | Freq: Once | INTRAMUSCULAR | Status: AC | PRN
  Administered 2021-06-30: 100 mL via INTRAVENOUS

## 2021-06-30 MED ORDER — LACTATED RINGERS IV BOLUS
1000.0000 mL | Freq: Once | INTRAVENOUS | Status: AC
  Administered 2021-06-30: 1000 mL via INTRAVENOUS

## 2021-06-30 MED ORDER — HALOPERIDOL LACTATE 5 MG/ML IJ SOLN
2.0000 mg | Freq: Once | INTRAMUSCULAR | Status: AC
  Administered 2021-06-30: 2 mg via INTRAVENOUS
  Filled 2021-06-30: qty 1

## 2021-06-30 MED ORDER — PANTOPRAZOLE SODIUM 20 MG PO TBEC: 20.0000 mg | DELAYED_RELEASE_TABLET | Freq: Two times a day (BID) | ORAL | 0 refills | Status: DC

## 2021-06-30 MED ORDER — PANTOPRAZOLE SODIUM 40 MG IV SOLR
40.0000 mg | Freq: Once | INTRAVENOUS | Status: AC
  Administered 2021-06-30: 40 mg via INTRAVENOUS
  Filled 2021-06-30: qty 40

## 2021-06-30 MED ORDER — ONDANSETRON HCL 4 MG/2ML IJ SOLN
4.0000 mg | Freq: Once | INTRAMUSCULAR | Status: AC
  Administered 2021-06-30: 4 mg via INTRAVENOUS
  Filled 2021-06-30: qty 2

## 2021-06-30 NOTE — ED Notes (Signed)
Patient transported to CT 

## 2021-06-30 NOTE — ED Notes (Addendum)
ED MD at [facility]. Pt seen by EDP prior to RN assessment, see MD notes, orders received and initiated. Family at [facility]. Pt alert,NAD, calm, interactive. Pending Urine sample.

## 2021-06-30 NOTE — ED Provider Notes (Signed)
[facility]PART[facility] Provider Note   CSN: [id number] Arrival date & time: 06/30/21  1212     History Chief Complaint  Patient presents with   Abdominal Pain    [name] [name] is a 60 y.o. female.   Abdominal Pain  60 year old female PMHx DM (uncontrolled, prior episodes of DKA), hypothyroidism, NICM with CHF (2018 EF 35-40%, though 55-60% on nuclear imaging 07/2020), HTN, HLD, CKD, IBS, anxiety, MDD, p/w abdominal pain. Onset ~0700 upon waking up, had lot of cube steak last night, located diffusely, severe, "like somebody is pushing on me" quality, no modifying factors. Associated sx, including belching, ~x10 episodes bilious NV, chills, mild diaphoresis, mild SOB, nonbloody diarrhea x3-4 episodes. Similar to prior gi ulcer pain. Continuing to pass flatus.  No further medical concern at this time, including fevers, cp, palpitations, pedal edema, dysuria, hematuria, headache, audiovisual change.  History obtained from patient, husband, chart review    Past Medical History:  Diagnosis Date   Allergy    seasonal   Anxiety    B12 deficiency    Chest pain 04/05/2017   CHF (congestive heart failure) ([place])    Diabetes mellitus type 1, uncontrolled ([place])    "dr. 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) 10/29/2013   Leg cramping    "@ night" (09/18/2013)   Migraine    "once q couple months" (11/26/2018)   Osteoporosis    Peripheral neuropathy    Umbilical hernia    unrepaired (09/18/2013)    Patient Active Problem List   Diagnosis Date Noted   Bladder wall thickening 03/09/2021   Orthostatic hypotension 03/06/2021   Gastric erosion    Urinary frequency 02/10/2020   Chronic  diastolic HF (heart failure) ([place]) 10/12/2019   Duodenal ulcer without hemorrhage or perforation    Acute esophagitis    Routine general medical examination at a health care facility 04/10/2019   Constipation    Nausea and vomiting 03/25/2019   Tachycardia 08/04/2018   MDD (major depressive disorder), recurrent episode, moderate ([place]) 07/01/2018   Colitis 03/07/2018   Chronic chest pain    Chronic systolic CHF (congestive heart failure) ([place]) 01/14/2018   Noncompliance 01/14/2018   Migraines 12/11/2017   Phrygian cap 06/20/2017   Colitis presumed infectious    Nonischemic cardiomyopathy ([place]) 03/22/2017   NSTEMI (non-ST elevated myocardial infarction) ([place]) 02/11/2017   H. pylori infection 02/09/2017   Hypomagnesemia 02/09/2017   Gastroparesis due to DM ([place]) 01/22/2017   Hyponatremia 07/24/2016   Type 2 diabetes mellitus with neurological complications ([place]) [id number]   Paroxysmal A-fib ([place]) 02/01/2015   Kidney disease, chronic, stage 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) 10/29/2013   Vitamin D deficiency 05/20/2012   Osteoporosis 02/01/2011   B12 deficiency 04/04/2010   GERD 04/08/2009   Generalized anxiety disorder 06/22/2008   Hypothyroidism 04/29/2008   Hyperlipidemia 04/29/2008   Irritable bowel syndrome 04/29/2008    Past Surgical History:  Procedure Laterality Date   BIOPSY  06/03/2019   Procedure: BIOPSY;  Surgeon: Jackquline Denmark, MD;  Location: [facility];  Service: Endoscopy;;   BIOPSY  08/17/2020   Procedure: BIOPSY;  Surgeon: Thornton Park, MD;  Location: [facility];  Service: Gastroenterology;;   [place]  ENTEROSCOPY N/A 06/03/2019   Procedure: ENTEROSCOPY;  Surgeon: Jackquline Denmark, MD;  Location: [facility];  Service: Endoscopy;  Laterality: N/A;   ESOPHAGOGASTRODUODENOSCOPY (EGD) WITH PROPOFOL N/A 08/17/2020   Procedure: ESOPHAGOGASTRODUODENOSCOPY (EGD) WITH PROPOFOL;  Surgeon: Thornton Park, MD;  Location: [facility];  Service: Gastroenterology;   Laterality: N/A;   LAPAROSCOPIC ASSISTED VAGINAL HYSTERECTOMY  09/23/2012   Procedure: LAPAROSCOPIC ASSISTED VAGINAL HYSTERECTOMY;  Surgeon: Linda Hedges, DO;  Location: [facility];  Service: Gynecology;  Laterality: N/A;  pull Dr Gregor Hams instrument   LEFT HEART CATH AND CORONARY ANGIOGRAPHY N/A 02/11/2017   Procedure: Left Heart Cath and Coronary Angiography;  Surgeon: Lorretta Harp, MD;  Location: [facility];  Service: Cardiovascular;  Laterality: N/A;   TUBAL LIGATION Bilateral 1992     OB History     Gravida  2   Para  2   Term      Preterm      AB      Living         SAB      IAB      Ectopic      Multiple      Live Births              Family History  Problem Relation Age of Onset   Diabetes Other    Heart disease Other    Heart failure Mother 55       Her heart skipped a beat and stopped - per pt   Diabetes Maternal Grandmother    Alzheimer's disease Maternal Grandmother    Coronary artery disease Maternal Grandmother    Heart attack Maternal Grandfather    Heart failure Brother    Diabetes Brother    Colon polyps Neg Hx    Esophageal cancer Neg Hx    Liver cancer Neg Hx    Pancreatic cancer Neg Hx    Stomach cancer Neg Hx    Crohn's disease Neg Hx    Rectal cancer Neg Hx     Social History   Tobacco Use   Smoking status: Former    Packs/day: 0.12    Years: 10.00    Pack years: 1.20    Types: Cigarettes    Quit date: 12/10/1990    Years since quitting: 30.5   Smokeless tobacco: Never  Vaping Use   Vaping Use: Never used  Substance Use Topics   Alcohol use: Never    Alcohol/week: 0.0 standard drinks   Drug use: Yes    Types: Marijuana    Comment: last use 2 days ago    Home Medications Prior to Admission medications   Medication Sig Start Date End Date Taking? Authorizing Provider  ondansetron (ZOFRAN) 4 MG tablet Take 1 tablet (4 mg total) by mouth every 6 (six) hours as needed for nausea or vomiting. 06/30/21  Yes  Nasiah Lehenbauer, MD  pantoprazole (PROTONIX) 20 MG tablet Take 1 tablet (20 mg total) by mouth 2 (two) times daily. 06/30/21 07/30/21 Yes Joselynn Amoroso, Hughes Better, MD  butalbital-acetaminophen-caffeine (FIORICET) 50-325-40 MG tablet Take 1 tablet by mouth every 6 (six) hours as needed for headache. 12/08/20 12/08/21  Loni Beckwith, PA-C  Continuous Blood Gluc Transmit (DEXCOM G6 TRANSMITTER) MISC USE AS DIRECTED FOR CONTINUOUS GLUCOSE MONITORING. REUSE TRANSMITTER X 90DAYS THEN DISCARD & REPLACE 02/13/21   [provider]  dicyclomine (BENTYL) 10 MG capsule TAKE 1 CAPSULE (10 MG TOTAL) BY MOUTH 2 (TWO) TIMES DAILY AS NEEDED FOR SPASMS. 06/29/21   [name],  Patrecia Pour, NP  fexofenadine (ALLEGRA) 180 MG tablet Take 1 tablet (180 mg total) by mouth daily. 02/11/20   Bedsole, Amy E, MD  fluticasone (FLONASE) 50 MCG/ACT nasal spray Place 2 sprays into both nostrils daily. 03/09/21   Bedsole, Amy E, MD  gabapentin (NEURONTIN) 300 MG capsule Take 300 mg by mouth 3 (three) times daily. 10/30/20   [provider]  insulin lispro (HUMALOG KWIKPEN) 100 UNIT/ML KiwkPen Inject into skin 3 times a day at meal time according to the following sliding scale: CBG 70-120 - 0 units / CBG 121-150 - 1 unit / CBG 151-200 - 2 units / CBG 201-250 - 3 units / CBG 251-300 - 5 units / CBG 301-350 - 7 units / CBG 351-400+ - 9 units  YOU MUST FOLLOW A STRICT DIABETIC DIET Patient taking differently: Inject into the skin See admin instructions. 8 units plus sliding scale Inject into skin 3 times a day at meal time according to the following sliding scale: CBG 70-120 - 0 units / CBG 121-150 - 1 unit / CBG 151-200 - 2 units / CBG 201-250 - 3 units / CBG 251-300 - 5 units / CBG 301-350 - 7 units / CBG 351-400+ - 9 units  YOU MUST FOLLOW A STRICT DIABETIC DIET 07/01/18   Bedsole, Amy E, MD  Insulin Pen Needle (PEN NEEDLES) 31G X 8 MM MISC Use to inject insulin 4 times a day.  Dx: E11.10 12/23/17    Jinny Sanders, MD  Lancets (ONETOUCH ULTRASOFT) lancets Use to check blood sugar daily as needed.  Dx: E11.10 12/23/17   Jinny Sanders, MD  levothyroxine (SYNTHROID) 175 MCG tablet Take 175 mcg by mouth daily. 06/03/20   [provider]  metoCLOPramide (REGLAN) 5 MG tablet Take '5mg'$  before breakfast lunch and dinner and '10mg'$  at bedtime 08/19/20   Nita Sells, MD  metoprolol succinate (TOPROL-XL) 25 MG 24 hr tablet Take 1 tablet (25 mg total) by mouth daily. 11/21/20   Minus Breeding, MD  midodrine (PROAMATINE) 2.5 MG tablet Take 1 tablet (2.5 mg total) by mouth 2 (two) times daily with a meal. 03/07/21   Minus Breeding, MD  pantoprazole (PROTONIX) 40 MG tablet Take 1 tablet (40 mg total) by mouth 2 (two) times daily before a meal. 08/19/20   Nita Sells, MD  promethazine (PHENERGAN) 25 MG tablet TAKE 1 TABLET BY MOUTH EVERY 8 HOUR AS NEEDED FOR NAUSEA AND VOMITING. 06/20/21   Bedsole, Amy E, MD  rosuvastatin (CRESTOR) 40 MG tablet TAKE 1 TABLET BY MOUTH EVERY DAY 04/12/20   Minus Breeding, MD  sertraline (ZOLOFT) 100 MG tablet TAKE 1 TABLET BY MOUTH EVERY DAY 11/26/19   Bedsole, Amy E, MD  TOUJEO SOLOSTAR 300 UNIT/ML Solostar Pen Inject 16 Units into the skin at bedtime. 08/19/20   Nita Sells, MD    Allergies    Codeine  Review of Systems   Review of Systems  Gastrointestinal:  Positive for abdominal pain.  All other systems reviewed and are negative.  Physical Exam Updated Vital Signs BP (!) 152/99   Pulse 98   Temp 98.3 F (36.8 C) (Oral)   Resp 17   Ht '4\' 11"'$  (1.499 m)   Wt 46.3 kg   LMP 08/11/2012   SpO2 99%   BMI 20.60 kg/m   Physical Exam Vitals and nursing note reviewed.  Constitutional:      Appearance: She is ill-appearing.  HENT:     Head: Normocephalic and atraumatic.  Mouth/Throat:     Pharynx: Oropharynx is clear.  Eyes:     Extraocular Movements: Extraocular movements intact.     Pupils: Pupils are equal, round, and  reactive to light.  Cardiovascular:     Rate and Rhythm: Normal rate and regular rhythm.     Heart sounds: Murmur heard.    No friction rub. No gallop.  Pulmonary:     Effort: Pulmonary effort is normal.     Breath sounds: No stridor. No wheezing, rhonchi or rales.  Abdominal:     Palpations: Abdomen is soft.     Tenderness: There is abdominal tenderness in the epigastric area. There is rebound. There is no guarding.     Hernia: No hernia is present.  Skin:    General: Skin is warm and dry.  Neurological:     General: No focal deficit present.     Mental Status: She is alert and oriented to person, place, and time.  Psychiatric:        Mood and Affect: Mood normal.        Behavior: Behavior normal.    ED Results / Procedures / Treatments   Labs (all labs ordered are listed, but only abnormal results are displayed) Labs Reviewed  COMPREHENSIVE METABOLIC PANEL - Abnormal; Notable for the following components:      Result Value   Sodium 134 (*)    Chloride 96 (*)    Glucose, Bld 321 (*)    AST 13 (*)    All other components within normal limits  CBC WITH DIFFERENTIAL/PLATELET - Abnormal; Notable for the following components:   WBC 11.8 (*)    RBC 5.47 (*)    Hemoglobin 16.6 (*)    HCT 47.9 (*)    All other components within normal limits  URINALYSIS, ROUTINE W REFLEX MICROSCOPIC - Abnormal; Notable for the following components:   Glucose, UA >=500 (*)    Hgb urine dipstick SMALL (*)    Ketones, ur 20 (*)    Bacteria, UA RARE (*)    All other components within normal limits  RAPID URINE DRUG SCREEN, HOSP PERFORMED - Abnormal; Notable for the following components:   Opiates POSITIVE (*)    Tetrahydrocannabinol POSITIVE (*)    All other components within normal limits  CBG MONITORING, ED - Abnormal; Notable for the following components:   Glucose-Capillary 300 (*)    All other components within normal limits  CBG MONITORING, ED - Abnormal; Notable for the following  components:   Glucose-Capillary 297 (*)    All other components within normal limits  RESP PANEL BY RT-PCR (FLU A&B, COVID) [id number]  BRAIN NATRIURETIC PEPTIDE  LIPASE, BLOOD    EKG EKG Interpretation  Date/Time:  Friday June 30 2021 12:28:19 EDT Ventricular Rate:  96 PR Interval:  138 QRS Duration: 78 QT Interval:  360 QTC Calculation: 454 R Axis:   99 Text Interpretation: Normal sinus rhythm Rightward axis Confirmed by Lajean Saver [phone]) on 06/30/2021 5:12:32 PM  Radiology DG Chest 2 View  Result Date: 06/30/2021 CLINICAL DATA:  Chest pain, shortness of breath EXAM: CHEST - 2 VIEW COMPARISON:  08/15/2020 FINDINGS: The heart size and mediastinal contours are within normal limits. Both lungs are clear. The visualized skeletal structures are unremarkable. IMPRESSION: No active cardiopulmonary disease. Electronically Signed   By: Davina Poke D.O.   On: 06/30/2021 13:42   CT ABDOMEN PELVIS W CONTRAST  Result Date: 06/30/2021 CLINICAL DATA:  60 year old female with abdominal pain. EXAM: CT  ABDOMEN AND PELVIS WITH CONTRAST TECHNIQUE: Multidetector CT imaging of the abdomen and pelvis was performed using the standard protocol following bolus administration of intravenous contrast. CONTRAST:  131m OMNIPAQUE IOHEXOL 300 MG/ML  SOLN COMPARISON:  CT abdomen pelvis dated 03/02/2021. FINDINGS: Lower chest: The visualized lung bases are clear. No intra-abdominal free air or free fluid. Hepatobiliary: The liver is unremarkable. Focal nodular thickening of the gallbladder fundus similar to prior CT most consistent with adenomyomatosis. No calcified gallstone or pericholecystic fluid. Mildly dilated common bile duct measuring up to 7 mm similar to prior CT. No calcified stone noted in the central CBD. Pancreas: Unremarkable. No pancreatic ductal dilatation or surrounding inflammatory changes. Spleen: Normal in size without focal abnormality. Adrenals/Urinary Tract: Adrenal glands are unremarkable.  Kidneys are normal, without renal calculi, focal lesion, or hydronephrosis. Bladder is unremarkable. Stomach/Bowel: There is diffusely thickened and edematous appearance of the distal stomach. Although this may represent focal gastritis, findings concerning for peptic ulcer. Clinical correlation and further evaluation with endoscopy is recommended to exclude an infiltrative process. This has slightly progressed since the prior CT. Several small scattered colonic diverticula without active inflammatory changes. There is apparent mild diffuse thickening of the colon likely related to underdistention. Mild colitis is less likely but not excluded. No bowel obstruction. The appendix is normal. Vascular/Lymphatic: The abdominal aorta and IVC are unremarkable. No portal venous gas. There is no adenopathy. Reproductive: Hysterectomy. No adnexal masses. Other: Small fat containing umbilical hernia. Musculoskeletal: No acute or significant osseous findings. IMPRESSION: 1. Inflammatory changes of the distal stomach concerning for focal gastritis or peptic ulcer. Clinical correlation and further evaluation with endoscopy is recommended. 2. Underdistention of the colon versus less likely mild colitis. 3. Colonic diverticulosis. No bowel obstruction. Normal appendix. Electronically Signed   By: AAnner CreteM.D.   On: 06/30/2021 19:43    Procedures Procedures   Medications Ordered in ED Medications  ondansetron (ZOFRAN) injection 4 mg (4 mg Intravenous Given 06/30/21 1740)  lactated ringers bolus 1,000 mL (0 mLs Intravenous Stopped 06/30/21 1859)  morphine 4 MG/ML injection 4 mg (4 mg Intravenous Given 06/30/21 1741)  pantoprazole (PROTONIX) injection 40 mg (40 mg Intravenous Given 06/30/21 1741)  iohexol (OMNIPAQUE) 300 MG/ML solution 100 mL (100 mLs Intravenous Contrast Given 06/30/21 1934)  haloperidol lactate (HALDOL) injection 2 mg (2 mg Intravenous Given 06/30/21 2129)    ED Course  I have reviewed the triage  vital signs and the nursing notes.  Pertinent labs & imaging results that were available during my care of the patient were reviewed by me and considered in my medical decision making (see chart for details).    MDM Rules/Calculators/A&P                          This is a 60year old female PMHx DM (uncontrolled, prior episodes of DKA), hypothyroidism, NICM with CHF (2018 EF 35-40%, though 55-60% on nuclear imaging 07/2020), HTN, HLD, CKD, IBS, anxiety, MDD, p/w abdominal pain, NVD, consistent with prior episode of gastritis.  HDS, afebrile.  Epigastric tenderness on exam with some rebound.  Initial interventions: Protonix, Zofran, morphine, LR 1 L with improvement of pain and nausea  All studies independently reviewed by myself, d/w the attending physician, factored into my MDM. -EKG: NSR 96 bpm, borderline right axis, normal intervals, no acute ST/T changes; right axis deviation new compared to prior from 03/07/2021 -CT AP with contrast: Inflammatory changes in distal stomach status and gastritis or peptic  ulcer -CMP: BGL 321, normal AG -CBCd: Modest leukocytosis of 11.8 -UA: Glucosuria with small amount of ketones -UDS: Positive for marijuana -Unremarkable: CXR, BNP, lipase, COVID/influenza PCR  Presentation appears most consistent with gastritis/GI ulcer disease.  May also relate to hyperemesis syndrome 2/2 marijuana use.  Benign abdominal exams without distention after administration of initial medications.  No urinary symptoms.  Presentation inconsistent with ovarian torsion.  Normal lipase.  Normal anion gap with sugar in the 300s, unlikely DKA.  No systemic infectious symptoms, mild leukocytosis, likely 2/2 stress from multiple episodes of vomiting.  Do not feel that patient requires admission for the symptoms as she responded well to the initial interventions.  She did display some apprehension about returning home as she felt that last night this occurred her symptoms were not  adequately controlled and she had to return for admission.  Administered Haldol 2 mg to control her nausea, with excellent response, and provided prescription for Zofran for home and refilled her Protonix.  I did encourage her to return if her symptoms persisted or worsened, and provided strict return precautions.  She and her husband understand and agree with plan.  Patient HDS on reevaluation, appears significantly improved, subsequently discharged.  Final Clinical Impression(s) / ED Diagnoses Final diagnoses:  Generalized abdominal pain  Non-intractable vomiting with nausea, unspecified vomiting type  Chronic gastritis without bleeding, unspecified gastritis type    Rx / DC Orders ED Discharge Orders          Ordered    ondansetron (ZOFRAN) 4 MG tablet  Every 6 hours PRN        06/30/21 2147    pantoprazole (PROTONIX) 20 MG tablet  2 times daily        06/30/21 2147             Levin Bacon, MD 07/01/21 [id number]    Lajean Saver, MD 07/03/21 1719

## 2021-06-30 NOTE — ED Notes (Signed)
Pure wick in place, no urine. Pending urine sample.

## 2021-06-30 NOTE — ED Notes (Signed)
EDP at [facility] 

## 2021-06-30 NOTE — Discharge Instructions (Addendum)
Your evaluated for your abdominal pain, nausea, vomiting.  You were diagnosed with gastritis.  We controlled your nausea with medication in the ER.  We will send you home on Zofran to continue controlling your nausea no more frequently than every 6 hours.  Be sure to hydrate well at home with fluids such as Gatorade which are rich in electrolytes.  I additionally provided you with some more Protonix, for which she should take 2 pills a day.  Please follow-up with your gastroenterologist when next available for reevaluation further management.  Please return to the ER if symptoms do not improve, or if you develop fevers, abdominal distention, or other worsening symptoms.

## 2021-06-30 NOTE — ED Provider Notes (Signed)
Emergency Medicine Provider Triage Evaluation Note  [name] [name] , a 60 y.o. female  was evaluated in triage.  Pt complains of generalized abdominal tenderness, nausea, emesis x5 starting this morning.  She also has associated shortness of breath..  History of DKA, diverticulitis, bladder wall thickening.  No recent changes to medication  Review of Systems  Positive: Abdominal pain, nausea, vomiting, SOB Negative: Chest pain  Physical Exam  BP (!) 114/99   Pulse 99   Temp 98.1 F (36.7 C) (Oral)   Resp 18   Ht '4\' 11"'$  (1.499 m)   Wt 46.3 kg   LMP 08/11/2012   SpO2 98%   BMI 20.60 kg/m  Gen:   Awake, no distress   Resp:  Normal effort  MSK:   Moves extremities without difficulty  Other:  Generalized abdominal tenderness, positive guarding no rigidity.  Medical Decision Making  Medically screening exam initiated at 12:37 PM.  Appropriate orders placed.  [name] [name] was informed that the remainder of the evaluation will be completed by another provider, this initial triage assessment does not replace that evaluation, and the importance of remaining in the ED until their evaluation is complete.     Sherrill Raring, PA-C 06/30/21 1238    Quintella Reichert, MD 06/30/21 1655

## 2021-06-30 NOTE — ED Triage Notes (Signed)
Pt endorses generalized abd pain and nausea starting this morning. SOB started a few mins ago. CBG 300 in triage.

## 2021-07-05 ENCOUNTER — Telehealth: Payer: Self-pay

## 2021-07-05 ENCOUNTER — Other Ambulatory Visit: Payer: Self-pay | Admitting: Family Medicine

## 2021-07-05 NOTE — Telephone Encounter (Signed)
Pt left VM on triage line stating that there is something that is waiting on Dr. Rometta Emery approval associated with pt's Dexcom monitor and that she would like Butch Penny to call her back as soon as she can.

## 2021-07-06 ENCOUNTER — Telehealth: Payer: Self-pay | Admitting: Cardiology

## 2021-07-06 NOTE — Telephone Encounter (Signed)
Noted  

## 2021-07-06 NOTE — Telephone Encounter (Signed)
Request Reference Number: [id number]. DEXCOM G6 MIS SENSOR is approved through 07/06/2022. [facility] notified of approval via fax.

## 2021-07-06 NOTE — Telephone Encounter (Signed)
Received PA request from [facility] for Dexcom G6 Sensor and Transmitter.  PA for Sensors completed on CoverMyMeds and sent for review.  Transmitter was previously approved from 06/16/2021 to 07/06/2022.  [facility] notified of this via Fax.  Spoke with Cortne to let her know I was working on the PAs.

## 2021-07-06 NOTE — Telephone Encounter (Signed)
Spoke to the patient about her blood pressure. She stated that yesterday she was in the kitchen and she blacked out. She did not have any injuries from the fall.   When she came to and was able, she checked her blood pressure and it was 68/48. This morning it was 98/48, after the midodrine.   She currently takes: Metoprolol Succinate 25 mg  Midodrine 2.5 mg bid  Per the last office note in March: Hypotension:    I am going to increase her midodrine to 2.5 mg scheduled twice daily.  She can take it at 7 AM and will now.  She might need a third dose and we will see over time.  She could also have a dose increase.  She has been having dizziness for a week now. She stated that she has been sick recently and thought it was from that. She has been advised to not drive for now due to her blacking out. She has also been advised to try a salty snack and rise slowly from a sitting position.   She has a follow up on 9/26 with Almyra Deforest, PA

## 2021-07-06 NOTE — Telephone Encounter (Signed)
Pt c/o BP issue: STAT if pt c/o blurred vision, one-sided weakness or slurred speech  1. What are your last 5 BP readings?  07/05/21 68/48 07/06/21 98/48  2. Are you having any other symptoms (ex. Dizziness, headache, blurred vision, passed out)? Dizziness, and passed out yesterday and 07/26  3. What is your BP issue? Hypotension     Pt c/o Syncope: STAT if syncope occurred within 30 minutes and pt complains of lightheadedness High Priority if episode of passing out, completely, today or in last 24 hours   Did you pass out today? No   When is the last time you passed out? 07/05/21  Has this occurred multiple times? Yes   Did you have any symptoms prior to passing out? Dizziness    Dizziness when standing up.

## 2021-07-10 MED ORDER — MIDODRINE HCL 5 MG PO TABS: ORAL_TABLET | ORAL | 4 refills | Status: DC

## 2021-07-10 NOTE — Telephone Encounter (Signed)
Patient has been aware of the recommendations:  Minus Breeding, MD  You 2 days ago     We can increase her midodrine to 5 mg 7 am 12 noon and 2 pm   New prescription has been sent in.

## 2021-07-21 ENCOUNTER — Ambulatory Visit: Payer: 59 | Admitting: Family Medicine

## 2021-07-21 ENCOUNTER — Other Ambulatory Visit: Payer: Self-pay

## 2021-07-21 VITALS — BP 80/60 | HR 80 | Temp 98.2°F | Ht 60.0 in | Wt 101.5 lb

## 2021-07-21 DIAGNOSIS — I959 Hypotension, unspecified: Secondary | ICD-10-CM | POA: Diagnosis not present

## 2021-07-21 DIAGNOSIS — R55 Syncope and collapse: Secondary | ICD-10-CM

## 2021-07-21 DIAGNOSIS — E1149 Type 2 diabetes mellitus with other diabetic neurological complication: Secondary | ICD-10-CM | POA: Diagnosis not present

## 2021-07-21 LAB — POCT GLYCOSYLATED HEMOGLOBIN (HGB A1C): Hemoglobin A1C: 11 % — AB (ref 4.0–5.6)

## 2021-07-21 MED ORDER — TOUJEO SOLOSTAR 300 UNIT/ML ~~LOC~~ SOPN: 18.0000 [IU] | PEN_INJECTOR | Freq: Every day | SUBCUTANEOUS | 3 refills | Status: DC

## 2021-07-21 MED ORDER — PEN NEEDLES 31G X 8 MM MISC: 11 refills | Status: DC

## 2021-07-21 MED ORDER — INSULIN LISPRO (1 UNIT DIAL) 100 UNIT/ML (KWIKPEN): PEN_INJECTOR | SUBCUTANEOUS | 11 refills | Status: DC

## 2021-07-21 NOTE — Patient Instructions (Addendum)
Decrease or stop soda. Try lower carb milk like unsweetened almond milk. Increase Tuojeo to 18 units at bedtime.  Follow blood sugar  in morning.. call if consistently > 120. Continue SSI at meals.  We will set up carotid dopplers.  Keep appt  with cardiology.

## 2021-07-21 NOTE — Progress Notes (Signed)
Patient ID: [name] [name], female    DOB: Aug 25, 1961, 60 y.o.   MRN: [id number]  This visit was conducted in person.  BP (!) 80/60   Pulse 80   Temp 98.2 F (36.8 C) (Temporal)   Ht 5' (1.524 m)   Wt 101 lb 8 oz (46 kg)   LMP 08/11/2012   SpO2 98%   BMI 19.82 kg/m    CC: Chief Complaint  Patient presents with   Follow-up    DM     Subjective:   HPI: [name] [name] is a 60 y.o. female presenting on 07/21/2021 for Follow-up (DM )   Diabetes:   Poor control  but improving on Tuojeo 16 units at bedtime. And Humalog three times daily per SSI.Marland Kitchen she has not been taking SSI given she was not eating when had Ulcer. In last few weeks she  has started back taking three times a day Has been taking tuojeo consistently.   08/2020 13.3 2.2021 15.0 Lab Results  Component Value Date   HGBA1C 11.0 (A) 07/21/2021  Using medications without difficulties: Hypoglycemic episodes: none Hyperglycemic episodes: 360 earlier today Feet problems: no ulcers Blood Sugars averaging: 120-200 eye exam within last year:  Wt Readings from Last 3 Encounters:  07/21/21 101 lb 8 oz (46 kg)  06/30/21 102 lb (46.3 kg)  03/09/21 101 lb 12 oz (46.2 kg)    Drinking 1-2 per day of mountain dew  down  Drinking milk at night    Dr. Percival Spanish is adjust hypotension meds midodrine to TID.Marland Kitchen Has follow up in 08/2021. Hx of noischemic cardiomyopathy BP Readings from Last 3 Encounters:  07/21/21 (!) 80/60  06/30/21 (!) 152/99  03/09/21 100/60   She has been noting if she turning neck to right or left.. she felt jolt of blood rushing to her head and then sharp head pain and bright vision. Occurs 2-3 times a month.  Most recent episode few weeks ago.   No numbness no weakness, no slurred speech, associated with mild confusion. She has had syncope  few weeks ago... felt low BP.         Relevant past medical, surgical, family and social history reviewed and updated as indicated. Interim medical history  since our last visit reviewed. Allergies and medications reviewed and updated. Outpatient Medications Prior to Visit  Medication Sig Dispense Refill   butalbital-acetaminophen-caffeine (FIORICET) 50-325-40 MG tablet Take 1 tablet by mouth every 6 (six) hours as needed for headache. 20 tablet 0   Continuous Blood Gluc Sensor (DEXCOM G6 SENSOR) MISC INJECT 1 SENSOR INTO SKIN EVERY 10 DAYS 3 each 11   Continuous Blood Gluc Transmit (DEXCOM G6 TRANSMITTER) MISC USE AS DIRECTED FOR CONTINUOUS GLUCOSE MONITORING. REUSE TRANSMITTER X 90DAYS THEN DISCARD & REPLACE 1 each 3   dicyclomine (BENTYL) 10 MG capsule TAKE 1 CAPSULE (10 MG TOTAL) BY MOUTH 2 (TWO) TIMES DAILY AS NEEDED FOR SPASMS. 180 capsule 0   fexofenadine (ALLEGRA) 180 MG tablet Take 1 tablet (180 mg total) by mouth daily. 90 tablet 3   fluticasone (FLONASE) 50 MCG/ACT nasal spray Place 2 sprays into both nostrils daily. 16 g 6   gabapentin (NEURONTIN) 300 MG capsule Take 300 mg by mouth 3 (three) times daily.     insulin lispro (HUMALOG KWIKPEN) 100 UNIT/ML KiwkPen Inject into skin 3 times a day at meal time according to the following sliding scale: CBG 70-120 - 0 units / CBG 121-150 - 1 unit / CBG 151-200 -  2 units / CBG 201-250 - 3 units / CBG 251-300 - 5 units / CBG 301-350 - 7 units / CBG 351-400+ - 9 units  YOU MUST FOLLOW A STRICT DIABETIC DIET (Patient taking differently: Inject into the skin See admin instructions. 8 units plus sliding scale Inject into skin 3 times a day at meal time according to the following sliding scale: CBG 70-120 - 0 units / CBG 121-150 - 1 unit / CBG 151-200 - 2 units / CBG 201-250 - 3 units / CBG 251-300 - 5 units / CBG 301-350 - 7 units / CBG 351-400+ - 9 units  YOU MUST FOLLOW A STRICT DIABETIC DIET) 4 pen 11   Insulin Pen Needle (PEN NEEDLES) 31G X 8 MM MISC Use to inject insulin 4 times a day.  Dx: E11.10 130 each 11   Lancets (ONETOUCH ULTRASOFT) lancets Use to check blood sugar daily as  needed.  Dx: E11.10 100 each 3   levothyroxine (SYNTHROID) 175 MCG tablet Take 175 mcg by mouth daily.     metoCLOPramide (REGLAN) 5 MG tablet Take '5mg'$  before breakfast lunch and dinner and '10mg'$  at bedtime 150 tablet 11   metoprolol succinate (TOPROL-XL) 25 MG 24 hr tablet Take 1 tablet (25 mg total) by mouth daily. 90 tablet 2   midodrine (PROAMATINE) 5 MG tablet Take one 5 mg tablet at 7 am, 12 noon and 2 pm. 90 tablet 4   ondansetron (ZOFRAN) 4 MG tablet Take 1 tablet (4 mg total) by mouth every 6 (six) hours as needed for nausea or vomiting. 12 tablet 0   pantoprazole (PROTONIX) 20 MG tablet Take 1 tablet (20 mg total) by mouth 2 (two) times daily. 60 tablet 0   pantoprazole (PROTONIX) 40 MG tablet Take 1 tablet (40 mg total) by mouth 2 (two) times daily before a meal. 60 tablet 3   promethazine (PHENERGAN) 25 MG tablet TAKE 1 TABLET BY MOUTH EVERY 8 HOUR AS NEEDED FOR NAUSEA AND VOMITING. 30 tablet 1   rosuvastatin (CRESTOR) 40 MG tablet TAKE 1 TABLET BY MOUTH EVERY DAY 90 tablet 3   sertraline (ZOLOFT) 100 MG tablet TAKE 1 TABLET BY MOUTH EVERY DAY 90 tablet 1   TOUJEO SOLOSTAR 300 UNIT/ML Solostar Pen Inject 16 Units into the skin at bedtime.     No facility-administered medications prior to visit.     Per HPI unless specifically indicated in ROS section below Review of Systems  Constitutional:  Positive for fatigue. Negative for fever.  HENT:  Negative for ear pain.   Eyes:  Negative for pain.  Respiratory:  Negative for chest tightness and shortness of breath.   Cardiovascular:  Negative for chest pain, palpitations and leg swelling.  Gastrointestinal:  Negative for abdominal pain.  Genitourinary:  Negative for dysuria.  Neurological:  Positive for dizziness and syncope.  Objective:  BP (!) 80/60   Pulse 80   Temp 98.2 F (36.8 C) (Temporal)   Ht 5' (1.524 m)   Wt 101 lb 8 oz (46 kg)   LMP 08/11/2012   SpO2 98%   BMI 19.82 kg/m   Wt Readings from Last 3 Encounters:   07/21/21 101 lb 8 oz (46 kg)  06/30/21 102 lb (46.3 kg)  03/09/21 101 lb 12 oz (46.2 kg)      Physical Exam Constitutional:      General: She is not in acute distress.    Appearance: Normal appearance. She is well-developed. She is not ill-appearing or toxic-appearing.  HENT:     Head: Normocephalic.     Right Ear: Hearing, tympanic membrane, ear canal and external ear normal. Tympanic membrane is not erythematous, retracted or bulging.     Left Ear: Hearing, tympanic membrane, ear canal and external ear normal. Tympanic membrane is not erythematous, retracted or bulging.     Nose: No mucosal edema or rhinorrhea.     Right Sinus: No maxillary sinus tenderness or frontal sinus tenderness.     Left Sinus: No maxillary sinus tenderness or frontal sinus tenderness.     Mouth/Throat:     Pharynx: Uvula midline.  Eyes:     General: Lids are normal. Lids are everted, no foreign bodies appreciated.     Conjunctiva/sclera: Conjunctivae normal.     Pupils: Pupils are equal, round, and reactive to light.  Neck:     Thyroid: No thyroid mass or thyromegaly.     Vascular: No carotid bruit.     Trachea: Trachea normal.  Cardiovascular:     Rate and Rhythm: Normal rate and regular rhythm.     Pulses: Normal pulses.     Heart sounds: Normal heart sounds, S1 normal and S2 normal. No murmur heard.   No friction rub. No gallop.  Pulmonary:     Effort: Pulmonary effort is normal. No tachypnea or respiratory distress.     Breath sounds: Normal breath sounds. No decreased breath sounds, wheezing, rhonchi or rales.  Abdominal:     General: Bowel sounds are normal.     Palpations: Abdomen is soft.     Tenderness: There is no abdominal tenderness.  Musculoskeletal:     Cervical back: Normal range of motion and neck supple.  Skin:    General: Skin is warm and dry.     Findings: No rash.  Neurological:     Mental Status: She is alert.  Psychiatric:        Mood and Affect: Mood is not anxious or  depressed.        Speech: Speech normal.        Behavior: Behavior normal. Behavior is cooperative.        Thought Content: Thought content normal.        Judgment: Judgment normal.      Results for orders placed or performed in visit on 07/21/21  POCT glycosylated hemoglobin (Hb A1C)  Result Value Ref Range   Hemoglobin A1C 11.0 (A) 4.0 - 5.6 %   HbA1c POC (<> result, manual entry)     HbA1c, POC (prediabetic range)     HbA1c, POC (controlled diabetic range)      This visit occurred during the SARS-CoV-2 public health emergency.  Safety protocols were in place, including screening questions prior to the visit, additional usage of staff PPE, and extensive cleaning of exam room while observing appropriate contact time as indicated for disinfecting solutions.   COVID 19 screen:  No recent travel or known exposure to COVID19 The patient denies respiratory symptoms of COVID 19 at this time. The importance of social distancing was discussed today.   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 Decrease or stop soda. Try lower carb milk like unsweetened almond milk. Increase Tuojeo to 18 units at bedtime.  Follow blood sugar  in morning.. call if consistently > 120. Continue SSI at meals.  Relevant Medications   TOUJEO SOLOSTAR 300 UNIT/ML Solostar Pen   insulin lispro (HUMALOG KWIKPEN) 100 UNIT/ML KwikPen   Other Relevant Orders   POCT glycosylated hemoglobin (Hb A1C) (Completed)   Syncope    Possibly due to hypotension. We will set up carotid dopplers.  Keep appt  with cardiology.      Relevant Orders   VAS US CAROTID (Completed)   Other Visit Diagnoses     Hypotension, unspecified hypotension type          Meds ordered this encounter  Medications   Insulin Pen Needle (PEN NEEDLES) 31G X 8 MM MISC    Sig: Use to inject  insulin 4 times a day.  Dx: E11.10    Dispense:  130 each    Refill:  11   TOUJEO SOLOSTAR 300 UNIT/ML Solostar Pen    Sig: Inject 18 Units into the skin at bedtime.    Dispense:  6 mL    Refill:  3   insulin lispro (HUMALOG KWIKPEN) 100 UNIT/ML KwikPen    Sig: Inject into skin 3 times a day at meal time according to the following sliding scale:  27 units max per day Dx E11.43    Dispense:  15 mL    Refill:  11    Orders Placed This Encounter  Procedures   POCT glycosylated hemoglobin (Hb A1C)      Eliezer Lofts, MD

## 2021-07-25 ENCOUNTER — Other Ambulatory Visit: Payer: Self-pay

## 2021-07-25 ENCOUNTER — Other Ambulatory Visit (HOSPITAL_COMMUNITY): Payer: Self-pay | Admitting: Family Medicine

## 2021-07-25 ENCOUNTER — Ambulatory Visit (HOSPITAL_COMMUNITY)
Admission: RE | Admit: 2021-07-25 | Discharge: 2021-07-25 | Disposition: A | Discharge status: Home / Self Care | Payer: 59 | Source: Ambulatory Visit | Attending: Cardiology | Admitting: Cardiology

## 2021-07-25 DIAGNOSIS — I2510 Atherosclerotic heart disease of native coronary artery without angina pectoris: Secondary | ICD-10-CM

## 2021-07-25 DIAGNOSIS — R55 Syncope and collapse: Secondary | ICD-10-CM | POA: Diagnosis present

## 2021-07-25 DIAGNOSIS — I2583 Coronary atherosclerosis due to lipid rich plaque: Secondary | ICD-10-CM

## 2021-07-31 ENCOUNTER — Emergency Department (HOSPITAL_BASED_OUTPATIENT_CLINIC_OR_DEPARTMENT_OTHER)
Admission: EM | Admit: 2021-07-31 | Discharge: 2021-07-31 | Disposition: A | Discharge status: Home / Self Care | Payer: 59 | Attending: Emergency Medicine | Admitting: Emergency Medicine

## 2021-07-31 ENCOUNTER — Other Ambulatory Visit: Payer: Self-pay

## 2021-07-31 ENCOUNTER — Encounter (HOSPITAL_BASED_OUTPATIENT_CLINIC_OR_DEPARTMENT_OTHER): Payer: Self-pay

## 2021-07-31 DIAGNOSIS — E860 Dehydration: Secondary | ICD-10-CM | POA: Diagnosis not present

## 2021-07-31 DIAGNOSIS — R739 Hyperglycemia, unspecified: Secondary | ICD-10-CM

## 2021-07-31 DIAGNOSIS — Z87891 Personal history of nicotine dependence: Secondary | ICD-10-CM | POA: Diagnosis not present

## 2021-07-31 DIAGNOSIS — R000 Tachycardia, unspecified: Secondary | ICD-10-CM | POA: Diagnosis not present

## 2021-07-31 DIAGNOSIS — Z794 Long term (current) use of insulin: Secondary | ICD-10-CM | POA: Diagnosis not present

## 2021-07-31 DIAGNOSIS — R55 Syncope and collapse: Secondary | ICD-10-CM | POA: Diagnosis present

## 2021-07-31 DIAGNOSIS — Z79899 Other long term (current) drug therapy: Secondary | ICD-10-CM | POA: Insufficient documentation

## 2021-07-31 DIAGNOSIS — E1165 Type 2 diabetes mellitus with hyperglycemia: Secondary | ICD-10-CM | POA: Insufficient documentation

## 2021-07-31 DIAGNOSIS — I5042 Chronic combined systolic (congestive) and diastolic (congestive) heart failure: Secondary | ICD-10-CM | POA: Diagnosis not present

## 2021-07-31 DIAGNOSIS — N182 Chronic kidney disease, stage 2 (mild): Secondary | ICD-10-CM | POA: Diagnosis not present

## 2021-07-31 DIAGNOSIS — R112 Nausea with vomiting, unspecified: Secondary | ICD-10-CM

## 2021-07-31 DIAGNOSIS — I130 Hypertensive heart and chronic kidney disease with heart failure and stage 1 through stage 4 chronic kidney disease, or unspecified chronic kidney disease: Secondary | ICD-10-CM | POA: Diagnosis not present

## 2021-07-31 DIAGNOSIS — E1122 Type 2 diabetes mellitus with diabetic chronic kidney disease: Secondary | ICD-10-CM | POA: Diagnosis not present

## 2021-07-31 DIAGNOSIS — E039 Hypothyroidism, unspecified: Secondary | ICD-10-CM | POA: Insufficient documentation

## 2021-07-31 LAB — I-STAT VENOUS BLOOD GAS, ED
Acid-Base Excess: 1 mmol/L (ref 0.0–2.0)
Bicarbonate: 26.5 mmol/L (ref 20.0–28.0)
Calcium, Ion: 1.15 mmol/L (ref 1.15–1.40)
HCT: 49 % — ABNORMAL HIGH (ref 36.0–46.0)
Hemoglobin: 16.7 g/dL — ABNORMAL HIGH (ref 12.0–15.0)
O2 Saturation: 55 %
Potassium: 3.1 mmol/L — ABNORMAL LOW (ref 3.5–5.1)
Sodium: 135 mmol/L (ref 135–145)
TCO2: 28 mmol/L (ref 22–32)
pCO2, Ven: 44.7 mmHg (ref 44.0–60.0)
pH, Ven: 7.381 (ref 7.250–7.430)
pO2, Ven: 30 mmHg — CL (ref 32.0–45.0)

## 2021-07-31 LAB — COMPREHENSIVE METABOLIC PANEL
ALT: 7 U/L (ref 0–44)
AST: 9 U/L — ABNORMAL LOW (ref 15–41)
Albumin: 4.5 g/dL (ref 3.5–5.0)
Alkaline Phosphatase: 65 U/L (ref 38–126)
Anion gap: 19 — ABNORMAL HIGH (ref 5–15)
BUN: 18 mg/dL (ref 6–20)
CO2: 23 mmol/L (ref 22–32)
Calcium: 9.6 mg/dL (ref 8.9–10.3)
Chloride: 94 mmol/L — ABNORMAL LOW (ref 98–111)
Creatinine, Ser: 0.75 mg/dL (ref 0.44–1.00)
GFR, Estimated: >60 mL/min (ref >60)
Glucose, Bld: 421 mg/dL — ABNORMAL HIGH (ref 70–99)
Potassium: 3.3 mmol/L — ABNORMAL LOW (ref 3.5–5.1)
Sodium: 136 mmol/L (ref 135–145)
Total Bilirubin: 0.5 mg/dL (ref 0.3–1.2)
Total Protein: 7.8 g/dL (ref 6.5–8.1)

## 2021-07-31 LAB — BASIC METABOLIC PANEL
Anion gap: 11 (ref 5–15)
BUN: 15 mg/dL (ref 6–20)
CO2: 27 mmol/L (ref 22–32)
Calcium: 8.9 mg/dL (ref 8.9–10.3)
Chloride: 103 mmol/L (ref 98–111)
Creatinine, Ser: 0.6 mg/dL (ref 0.44–1.00)
GFR, Estimated: >60 mL/min (ref >60)
Glucose, Bld: 166 mg/dL — ABNORMAL HIGH (ref 70–99)
Potassium: 3.4 mmol/L — ABNORMAL LOW (ref 3.5–5.1)
Sodium: 141 mmol/L (ref 135–145)

## 2021-07-31 LAB — CBC WITH DIFFERENTIAL/PLATELET
Abs Immature Granulocytes: 0.07 10*3/uL (ref 0.00–0.07)
Basophils Absolute: 0 10*3/uL (ref 0.0–0.1)
Basophils Relative: 0 %
Eosinophils Absolute: 0 10*3/uL (ref 0.0–0.5)
Eosinophils Relative: 0 %
HCT: 46.8 % — ABNORMAL HIGH (ref 36.0–46.0)
Hemoglobin: 16.4 g/dL — ABNORMAL HIGH (ref 12.0–15.0)
Immature Granulocytes: 1 %
Lymphocytes Relative: 16 %
Lymphs Abs: 1.4 10*3/uL (ref 0.7–4.0)
MCH: 29.6 pg (ref 26.0–34.0)
MCHC: 35 g/dL (ref 30.0–36.0)
MCV: 84.5 fL (ref 80.0–100.0)
Monocytes Absolute: 0.3 10*3/uL (ref 0.1–1.0)
Monocytes Relative: 3 %
Neutro Abs: 6.7 10*3/uL (ref 1.7–7.7)
Neutrophils Relative %: 80 %
Platelets: 413 10*3/uL — ABNORMAL HIGH (ref 150–400)
RBC: 5.54 MIL/uL — ABNORMAL HIGH (ref 3.87–5.11)
RDW: 13.7 % (ref 11.5–15.5)
WBC: 8.4 10*3/uL (ref 4.0–10.5)
nRBC: 0 % (ref 0.0–0.2)

## 2021-07-31 LAB — URINALYSIS, ROUTINE W REFLEX MICROSCOPIC
Bilirubin Urine: NEGATIVE
Glucose, UA: >1000 mg/dL — AB
Hgb urine dipstick: NEGATIVE
Ketones, ur: 15 mg/dL — AB
Leukocytes,Ua: NEGATIVE
Nitrite: NEGATIVE
Specific Gravity, Urine: 1.024 (ref 1.005–1.030)
pH: 6 (ref 5.0–8.0)

## 2021-07-31 LAB — CBG MONITORING, ED
Glucose-Capillary: 375 mg/dL — ABNORMAL HIGH (ref 70–99)
Glucose-Capillary: 177 mg/dL — ABNORMAL HIGH (ref 70–99)

## 2021-07-31 LAB — BETA-HYDROXYBUTYRIC ACID: Beta-Hydroxybutyric Acid: 1.83 mmol/L — ABNORMAL HIGH (ref 0.05–0.27)

## 2021-07-31 LAB — LIPASE, BLOOD: Lipase: <10 U/L — ABNORMAL LOW (ref 11–51)

## 2021-07-31 MED ORDER — SODIUM CHLORIDE 0.9 % IV BOLUS
500.0000 mL | Freq: Once | INTRAVENOUS | Status: AC
  Administered 2021-07-31: 500 mL via INTRAVENOUS

## 2021-07-31 MED ORDER — METOCLOPRAMIDE HCL 5 MG/ML IJ SOLN
10.0000 mg | Freq: Once | INTRAMUSCULAR | Status: AC
  Administered 2021-07-31: 10 mg via INTRAVENOUS
  Filled 2021-07-31: qty 2

## 2021-07-31 MED ORDER — PROMETHAZINE HCL 25 MG/ML IJ SOLN
INTRAMUSCULAR | Status: AC
  Filled 2021-07-31: qty 1

## 2021-07-31 MED ORDER — KETOROLAC TROMETHAMINE 30 MG/ML IJ SOLN
15.0000 mg | Freq: Once | INTRAMUSCULAR | Status: AC
  Administered 2021-07-31: 15 mg via INTRAVENOUS
  Filled 2021-07-31: qty 1

## 2021-07-31 MED ORDER — SODIUM CHLORIDE 0.9 % IV BOLUS
1000.0000 mL | Freq: Once | INTRAVENOUS | Status: AC
  Administered 2021-07-31: 1000 mL via INTRAVENOUS

## 2021-07-31 MED ORDER — INSULIN ASPART 100 UNIT/ML IJ SOLN
10.0000 [IU] | Freq: Once | INTRAMUSCULAR | Status: AC
  Administered 2021-07-31: 10 [IU] via SUBCUTANEOUS

## 2021-07-31 MED ORDER — SODIUM CHLORIDE 0.9 % IV SOLN
12.5000 mg | Freq: Once | INTRAVENOUS | Status: AC
  Administered 2021-07-31: 12.5 mg via INTRAVENOUS
  Filled 2021-07-31: qty 0.5

## 2021-07-31 MED ORDER — ONDANSETRON HCL 4 MG/2ML IJ SOLN
4.0000 mg | Freq: Once | INTRAMUSCULAR | Status: AC
  Administered 2021-07-31: 4 mg via INTRAVENOUS
  Filled 2021-07-31: qty 2

## 2021-07-31 NOTE — ED Notes (Signed)
Pt reports husband is here to pick her up. Pt ambulatory with no issues to the front lobby

## 2021-07-31 NOTE — ED Provider Notes (Signed)
[facility] EMERGENCY DEPT Provider Note   CSN: [id number] Arrival date & time: 07/31/21  0754     History Chief Complaint  Patient presents with   Loss of Consciousness    [name] [name] is a 60 y.o. female.  She is brought in by EMS for evaluation of a syncopal event.  She said she has had nausea vomiting and diarrhea since last evening after she ate dinner.  Nonbloody nonbilious.  She said she was going to the bathroom to sit on the toilet when she passed out.  She denies any injury from this.  She has had some recent low blood pressures and her cardiologist has been increasing her midodrine.  She has a mild headache.  No blurry vision double vision.  No numbness or weakness.  No chest pain or shortness of breath.  Mild cough chronic.  No abdominal pain.  No urinary symptoms.  The history is provided by the patient and the EMS personnel.  Loss of Consciousness Episode history:  Single Most recent episode:  Today Progression:  Resolved Chronicity:  Recurrent Context: medication change and urination   Relieved by:  Lying down Worsened by:  Nothing Ineffective treatments:  None tried Associated symptoms: headaches, nausea and vomiting   Associated symptoms: no chest pain, no difficulty breathing, no fever, no focal sensory loss, no rectal bleeding and no shortness of breath       Past Medical History:  Diagnosis Date   Allergy    seasonal   Anxiety    B12 deficiency    Chest pain 04/05/2017   CHF (congestive heart failure) ([place])    Diabetes mellitus type 1, uncontrolled ([place])    "dr. 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) 10/29/2013   Leg cramping    "@ night" (09/18/2013)   Migraine    "once  q couple months" (11/26/2018)   Osteoporosis    Peripheral neuropathy    Umbilical hernia    unrepaired (09/18/2013)    Patient Active Problem List   Diagnosis Date Noted   Bladder wall thickening 03/09/2021   Orthostatic hypotension 03/06/2021   Gastric erosion    Urinary frequency 02/10/2020   Chronic diastolic HF (heart failure) ([place]) 10/12/2019   Duodenal ulcer without hemorrhage or perforation    Acute esophagitis    Routine general medical examination at a health care facility 04/10/2019   Constipation    Nausea and vomiting 03/25/2019   Tachycardia 08/04/2018   MDD (major depressive disorder), recurrent episode, moderate ([place]) 07/01/2018   Colitis 03/07/2018   Chronic chest pain    Chronic systolic CHF (congestive heart failure) ([place]) 01/14/2018   Noncompliance 01/14/2018   Migraines 12/11/2017   Phrygian cap 06/20/2017   Colitis presumed infectious    Nonischemic cardiomyopathy ([place]) 03/22/2017   NSTEMI (non-ST elevated myocardial infarction) ([place]) 02/11/2017   H. 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) 10/29/2013   Vitamin D deficiency 05/20/2012   Osteoporosis 02/01/2011   B12 deficiency 04/04/2010  GERD 04/08/2009   Generalized anxiety disorder 06/22/2008   Hypothyroidism 04/29/2008   Hyperlipidemia 04/29/2008   Irritable bowel syndrome 04/29/2008    Past Surgical History:  Procedure Laterality Date   BIOPSY  06/03/2019   Procedure: BIOPSY;  Surgeon: Jackquline Denmark, MD;  Location: [facility];  Service: Endoscopy;;   BIOPSY  08/17/2020   Procedure: BIOPSY;  Surgeon: Thornton Park, MD;  Location: [facility];  Service: Gastroenterology;;   [place]   ENTEROSCOPY N/A 06/03/2019   Procedure:  ENTEROSCOPY;  Surgeon: Jackquline Denmark, MD;  Location: [facility];  Service: Endoscopy;  Laterality: N/A;   ESOPHAGOGASTRODUODENOSCOPY (EGD) WITH PROPOFOL N/A 08/17/2020   Procedure: ESOPHAGOGASTRODUODENOSCOPY (EGD) WITH PROPOFOL;  Surgeon: Thornton Park, MD;  Location: [facility];  Service: Gastroenterology;  Laterality: N/A;   LAPAROSCOPIC ASSISTED VAGINAL HYSTERECTOMY  09/23/2012   Procedure: LAPAROSCOPIC ASSISTED VAGINAL HYSTERECTOMY;  Surgeon: Linda Hedges, DO;  Location: [facility];  Service: Gynecology;  Laterality: N/A;  pull Dr Gregor Hams instrument   LEFT HEART CATH AND CORONARY ANGIOGRAPHY N/A 02/11/2017   Procedure: Left Heart Cath and Coronary Angiography;  Surgeon: Lorretta Harp, MD;  Location: [facility];  Service: Cardiovascular;  Laterality: N/A;   TUBAL LIGATION Bilateral 1992     OB History     Gravida  2   Para  2   Term      Preterm      AB      Living         SAB      IAB      Ectopic      Multiple      Live Births              Family History  Problem Relation Age of Onset   Diabetes Other    Heart disease Other    Heart failure Mother 83       Her heart skipped a beat and stopped - per pt   Diabetes Maternal Grandmother    Alzheimer's disease Maternal Grandmother    Coronary artery disease Maternal Grandmother    Heart attack Maternal Grandfather    Heart failure Brother    Diabetes Brother    Colon polyps Neg Hx    Esophageal cancer Neg Hx    Liver cancer Neg Hx    Pancreatic cancer Neg Hx    Stomach cancer Neg Hx    Crohn's disease Neg Hx    Rectal cancer Neg Hx     Social History   Tobacco Use   Smoking status: Former    Packs/day: 0.12    Years: 10.00    Pack years: 1.20    Types: Cigarettes    Quit date: 12/10/1990    Years since quitting: 30.6   Smokeless tobacco: Never  Vaping Use   Vaping Use: Never used  Substance Use Topics   Alcohol use: Never    Alcohol/week: 0.0 standard drinks   Drug use: Yes     Types: Marijuana    Comment: last use 2 days ago    Home Medications Prior to Admission medications   Medication Sig Start Date End Date Taking? Authorizing Provider  butalbital-acetaminophen-caffeine (FIORICET) 50-325-40 MG tablet Take 1 tablet by mouth every 6 (six) hours as needed for headache. 12/08/20 12/08/21  Loni Beckwith, PA-C  Continuous Blood Gluc Sensor (DEXCOM G6 SENSOR) MISC INJECT 1 SENSOR INTO SKIN EVERY 10 DAYS 07/05/21   Jinny Sanders, MD  Continuous Blood Gluc  Transmit (DEXCOM G6 TRANSMITTER) MISC USE AS DIRECTED FOR CONTINUOUS GLUCOSE MONITORING. REUSE TRANSMITTER X 90DAYS THEN DISCARD & REPLACE 07/05/21   Bedsole, Amy E, MD  dicyclomine (BENTYL) 10 MG capsule TAKE 1 CAPSULE (10 MG TOTAL) BY MOUTH 2 (TWO) TIMES DAILY AS NEEDED FOR SPASMS. 06/29/21   Noralyn Pick, NP  fexofenadine (ALLEGRA) 180 MG tablet Take 1 tablet (180 mg total) by mouth daily. 02/11/20   Bedsole, Amy E, MD  fluticasone (FLONASE) 50 MCG/ACT nasal spray Place 2 sprays into both nostrils daily. 03/09/21   Bedsole, Amy E, MD  gabapentin (NEURONTIN) 300 MG capsule Take 300 mg by mouth 3 (three) times daily. 10/30/20   [provider]  insulin lispro (HUMALOG KWIKPEN) 100 UNIT/ML KwikPen Inject into skin 3 times a day at meal time according to the following sliding scale:  27 units max per day Dx E11.43 07/21/21   Bedsole, Amy E, MD  Insulin Pen Needle (PEN NEEDLES) 31G X 8 MM MISC Use to inject insulin 4 times a day.  Dx: E11.10 07/21/21   Jinny Sanders, MD  Lancets (ONETOUCH ULTRASOFT) lancets Use to check blood sugar daily as needed.  Dx: E11.10 12/23/17   Jinny Sanders, MD  levothyroxine (SYNTHROID) 175 MCG tablet Take 175 mcg by mouth daily. 06/03/20   [provider]  metoCLOPramide (REGLAN) 5 MG tablet Take '5mg'$  before breakfast lunch and dinner and '10mg'$  at bedtime 08/19/20   Nita Sells, MD  metoprolol succinate (TOPROL-XL) 25 MG 24 hr tablet Take 1 tablet (25 mg  total) by mouth daily. 11/21/20   Minus Breeding, MD  midodrine (PROAMATINE) 5 MG tablet Take one 5 mg tablet at 7 am, 12 noon and 2 pm. 07/10/21   Minus Breeding, MD  ondansetron (ZOFRAN) 4 MG tablet Take 1 tablet (4 mg total) by mouth every 6 (six) hours as needed for nausea or vomiting. 06/30/21   Levin Bacon, MD  pantoprazole (PROTONIX) 20 MG tablet Take 1 tablet (20 mg total) by mouth 2 (two) times daily. 06/30/21 07/30/21  Levin Bacon, MD  pantoprazole (PROTONIX) 40 MG tablet Take 1 tablet (40 mg total) by mouth 2 (two) times daily before a meal. 08/19/20   Nita Sells, MD  promethazine (PHENERGAN) 25 MG tablet TAKE 1 TABLET BY MOUTH EVERY 8 HOUR AS NEEDED FOR NAUSEA AND VOMITING. 06/20/21   Bedsole, Amy E, MD  rosuvastatin (CRESTOR) 40 MG tablet TAKE 1 TABLET BY MOUTH EVERY DAY 04/12/20   Minus Breeding, MD  sertraline (ZOLOFT) 100 MG tablet TAKE 1 TABLET BY MOUTH EVERY DAY 11/26/19   Bedsole, Amy E, MD  TOUJEO SOLOSTAR 300 UNIT/ML Solostar Pen Inject 18 Units into the skin at bedtime. 07/21/21   Jinny Sanders, MD    Allergies    Codeine  Review of Systems   Review of Systems  Constitutional:  Negative for fever.  HENT:  Negative for sore throat.   Eyes:  Negative for visual disturbance.  Respiratory:  Positive for cough. Negative for shortness of breath.   Cardiovascular:  Positive for syncope. Negative for chest pain.  Gastrointestinal:  Positive for diarrhea, nausea and vomiting. Negative for abdominal pain.  Genitourinary:  Negative for dysuria.  Musculoskeletal:  Negative for neck pain.  Skin:  Negative for rash.  Neurological:  Positive for headaches.   Physical Exam Updated Vital Signs BP (!) 169/116 (BP Location: Right Arm)   Pulse 85   Temp 97.8 F (36.6 C) (Oral)   Resp 16  Ht 4' 11.5" (1.511 m)   Wt 45.8 kg   LMP 08/11/2012   SpO2 100%   BMI 20.06 kg/m   Physical Exam Vitals and nursing note reviewed.  Constitutional:      General:  She is not in acute distress.    Appearance: Normal appearance. She is well-developed.  HENT:     Head: Normocephalic and atraumatic.  Eyes:     Conjunctiva/sclera: Conjunctivae normal.  Cardiovascular:     Rate and Rhythm: Normal rate and regular rhythm.     Heart sounds: No murmur heard. Pulmonary:     Effort: Pulmonary effort is normal. No respiratory distress.     Breath sounds: Normal breath sounds.  Abdominal:     Palpations: Abdomen is soft.     Tenderness: There is no abdominal tenderness. There is no guarding or rebound.  Musculoskeletal:        General: No deformity or signs of injury. Normal range of motion.     Cervical back: Neck supple.  Skin:    General: Skin is warm and dry.  Neurological:     General: No focal deficit present.     Mental Status: She is alert and oriented to person, place, and time.     Motor: No weakness.    ED Results / Procedures / Treatments   Labs (all labs ordered are listed, but only abnormal results are displayed) Labs Reviewed  COMPREHENSIVE METABOLIC PANEL - Abnormal; Notable for the following components:      Result Value   Potassium 3.3 (*)    Chloride 94 (*)    Glucose, Bld 421 (*)    AST 9 (*)    Anion gap 19 (*)    All other components within normal limits  LIPASE, BLOOD - Abnormal; Notable for the following components:   Lipase <10 (*)    All other components within normal limits  CBC WITH DIFFERENTIAL/PLATELET - Abnormal; Notable for the following components:   RBC 5.54 (*)    Hemoglobin 16.4 (*)    HCT 46.8 (*)    Platelets 413 (*)    All other components within normal limits  URINALYSIS, ROUTINE W REFLEX MICROSCOPIC - Abnormal; Notable for the following components:   Glucose, UA >1,000 (*)    Ketones, ur 15 (*)    Protein, ur TRACE (*)    All other components within normal limits  BETA-HYDROXYBUTYRIC ACID - Abnormal; Notable for the following components:   Beta-Hydroxybutyric Acid 1.83 (*)    All other  components within normal limits  BASIC METABOLIC PANEL - Abnormal; Notable for the following components:   Potassium 3.4 (*)    Glucose, Bld 166 (*)    All other components within normal limits  CBG MONITORING, ED - Abnormal; Notable for the following components:   Glucose-Capillary 375 (*)    All other components within normal limits  I-STAT VENOUS BLOOD GAS, ED - Abnormal; Notable for the following components:   pO2, Ven 30.0 (*)    Potassium 3.1 (*)    HCT 49.0 (*)    Hemoglobin 16.7 (*)    All other components within normal limits  CBG MONITORING, ED - Abnormal; Notable for the following components:   Glucose-Capillary 177 (*)    All other components within normal limits    EKG EKG Interpretation  Date/Time:  Monday July 31 2021 08:04:52 EDT Ventricular Rate:  103 PR Interval:    QRS Duration: 89 QT Interval:  347 QTC Calculation: 455 R  Axis:   96 Text Interpretation: Sinus tachycardia Borderline right axis deviation Consider left ventricular hypertrophy Anterior Q waves, possibly due to LVH Borderline T abnormalities, inferior leads Confirmed by Aletta Edouard [phone]) on 07/31/2021 9:02:10 AM  Radiology No results found.  Procedures Procedures   Medications Ordered in ED Medications  promethazine (PHENERGAN) 25 MG/ML injection (  Not Given 07/31/21 0909)  sodium chloride 0.9 % bolus 500 mL (0 mLs Intravenous Stopped 07/31/21 1016)  promethazine (PHENERGAN) 12.5 mg in sodium chloride 0.9 % 50 mL IVPB (0 mg Intravenous Stopped 07/31/21 0851)  insulin aspart (novoLOG) injection 10 Units (10 Units Subcutaneous Given 07/31/21 1015)  ondansetron (ZOFRAN) injection 4 mg (4 mg Intravenous Given 07/31/21 1011)  ketorolac (TORADOL) 30 MG/ML injection 15 mg (15 mg Intravenous Given 07/31/21 1236)  metoCLOPramide (REGLAN) injection 10 mg (10 mg Intravenous Given 07/31/21 1237)  sodium chloride 0.9 % bolus 1,000 mL (0 mLs Intravenous Stopped 07/31/21 1330)    ED Course  I have  reviewed the triage vital signs and the nursing notes.  Pertinent labs & imaging results that were available during my care of the patient were reviewed by me and considered in my medical decision making (see chart for details).  Clinical Course as of 07/31/21 1802  Mon Jul 31, 2021  1538 Patient feeling better with her symptoms.  Reviewed her labs with [facility] hospitalist Dr. Dwyane Dee who did not feel she needed to be on an insulin drip but recommended continuing fluids and sliding scale.  He felt that if she was not correcting then she may require admission. [MB]  [id number] Reassessed patient.  Her gap is closed and she is feeling better.  P.o. trialing.  Recommended close follow-up with her primary care doctor.  Return instructions discussed [MB]    Clinical Course User Index [MB] Hayden Rasmussen, MD   MDM Rules/Calculators/A&P                          This patient complains of syncope, nausea vomiting diarrhea, headache; this involves an extensive number of treatment Options and is a complaint that carries with it a high risk of complications and Morbidity. The differential includes dehydration, AGE, DKA, metabolic derangement, migraine headache, head injury  I ordered, reviewed and interpreted labs, which included CBC with normal white count, hemoglobin stable from priors, chemistries with low bicarb elevated glucose elevated anion gap, mildly low potassium, VBG with normal pH, elevated beta hydroxybutyrate, urinalysis without signs of infection.  Patient had repeat blood sugars down to 177 and chemistries with closed gap I ordered medication IV fluids, subcu insulin, nausea medication  Additional history obtained from EMS Previous records obtained and reviewed in epic, patient has had similar presentations and usually improves with treatment in the emergency department  After the interventions stated above, I reevaluated the patient and found patient symptoms to be improved.  She is  tolerating p.o. and feels she could manage her symptoms at home.  She is calling for a ride.  Return instructions discussed.   Final Clinical Impression(s) / ED Diagnoses Final diagnoses:  Nausea vomiting and diarrhea  Acute hyperglycemia  Dehydration  Syncope, unspecified syncope type    Rx / DC Orders ED Discharge Orders     None        Hayden Rasmussen, MD 07/31/21 [phone]

## 2021-07-31 NOTE — ED Triage Notes (Signed)
Pt brought in by [facility] from home following a syncopal episode while in bathroom.  Per EMS, pt has had N/V and polyuria at home with elevated blood sugar, and has history of DKA.

## 2021-07-31 NOTE — ED Notes (Signed)
Pt ambulatory to restroom without assistance, steady gate noted. Pt provided PO liquids per MD

## 2021-07-31 NOTE — Discharge Instructions (Signed)
You were seen in the emergency department for evaluation of a syncopal event in the setting of recent nausea vomiting and diarrhea.  Your blood sugar was elevated and you were showing signs of dehydration.  It will be important for you to stay on top of your blood sugars and stay well-hydrated.  Please contact your primary care doctor and your cardiologist for close follow-up.  Return to the emergency department if any worsening or concerning symptoms.

## 2021-08-03 ENCOUNTER — Ambulatory Visit: Payer: 59 | Admitting: Nurse Practitioner

## 2021-08-21 ENCOUNTER — Other Ambulatory Visit: Payer: Self-pay | Admitting: Family Medicine

## 2021-08-21 NOTE — Telephone Encounter (Signed)
Last office visit 07/21/2021 for DM.  Last refilled 06/20/2021 for #30 with 1 refill.  No future appointments with PCP.

## 2021-09-02 DIAGNOSIS — R55 Syncope and collapse: Secondary | ICD-10-CM | POA: Insufficient documentation

## 2021-09-02 NOTE — Assessment & Plan Note (Signed)
Chronic, inadequate control. Decrease or stop soda. Try lower carb milk like unsweetened almond milk. Increase Tuojeo to 18 units at bedtime.  Follow blood sugar  in morning.. call if consistently > 120. Continue SSI at meals.

## 2021-09-02 NOTE — Assessment & Plan Note (Signed)
Possibly due to hypotension. We will set up carotid dopplers.  Keep appt  with cardiology.

## 2021-09-04 ENCOUNTER — Other Ambulatory Visit: Payer: Self-pay

## 2021-09-04 ENCOUNTER — Ambulatory Visit (INDEPENDENT_AMBULATORY_CARE_PROVIDER_SITE_OTHER): Payer: 59 | Admitting: Physician Assistant

## 2021-09-04 VITALS — BP 98/64 | HR 91 | Ht 59.5 in | Wt 103.2 lb

## 2021-09-04 DIAGNOSIS — E876 Hypokalemia: Secondary | ICD-10-CM | POA: Diagnosis not present

## 2021-09-04 DIAGNOSIS — E785 Hyperlipidemia, unspecified: Secondary | ICD-10-CM | POA: Diagnosis not present

## 2021-09-04 DIAGNOSIS — I428 Other cardiomyopathies: Secondary | ICD-10-CM

## 2021-09-04 DIAGNOSIS — R42 Dizziness and giddiness: Secondary | ICD-10-CM

## 2021-09-04 DIAGNOSIS — Z8639 Personal history of other endocrine, nutritional and metabolic disease: Secondary | ICD-10-CM

## 2021-09-04 LAB — BASIC METABOLIC PANEL
BUN/Creatinine Ratio: 18 (ref 9–23)
BUN: 11 mg/dL (ref 6–24)
CO2: 25 mmol/L (ref 20–29)
Calcium: 9.9 mg/dL (ref 8.7–10.2)
Chloride: 94 mmol/L — ABNORMAL LOW (ref 96–106)
Creatinine, Ser: 0.61 mg/dL (ref 0.57–1.00)
Glucose: 161 mg/dL — ABNORMAL HIGH (ref 70–99)
Potassium: 4.8 mmol/L (ref 3.5–5.2)
Sodium: 138 mmol/L (ref 134–144)
eGFR: 104 mL/min/{1.73_m2} (ref >59)

## 2021-09-04 MED ORDER — MIDODRINE HCL 2.5 MG PO TABS: ORAL_TABLET | ORAL | 5 refills | Status: DC

## 2021-09-04 NOTE — Progress Notes (Signed)
Cardiology Office Note:    Date:  09/06/2021   ID:  [name] [name], DOB 1961/10/14, MRN [id number]  PCP:  Jinny Sanders, MD   [facility] Providers Cardiologist:  Minus Breeding, MD     Referring MD: Jinny Sanders, MD   Chief Complaint  Patient presents with   Follow-up    Seen for Dr. Percival Spanish    History of Present Illness:    [name] [name] is a 60 y.o. female with a hx of NICM, normal coronary arteries on cath in 2012, type 1 diabetes mellitus with peripheral neuropathy, hyperlipidemia, CKD stage II and hypothyroidism.  Repeat echocardiogram obtained in 2018 showed EF 35 to 40%.  Repeat cardiac catheterization on 02/11/2017 continues to demonstrate normal coronary arteries.  Echocardiogram obtained on 01/13/2019 showed EF 55 to 60%. Patient has been hospitalized several times due to DKA and poorly controlled diabetes.  She has a history of medical noncompliance.  Myoview obtained on 07/20/2020 showed EF 64%, overall low risk study with no ischemia, inferior and apical hypokinesis which was suspected to be artifact.  She also has a history of orthostatic dizziness.  Zio patch monitor obtained on 09/06/2020 did not demonstrate any significant arrhythmia.  She was placed on compression stocking and as needed midodrine.  She was last seen by Dr. Percival Spanish on 03/07/2021 at which time her dizziness has improved.  Midodrine was increased to 2.5 mg twice a day. Carotid Doppler obtained on 07/25/2021 showed a 1 to 39% stenosis on the right, near normal with minimal plaque on the left side.  She was most recently seen in the ED on 07/31/2021 due to syncope after having persistent nausea, vomiting and diarrhea.  On arrival, potassium was low at 3.3.  Hemoglobin 16.4.  Glucose 421.  Beta hydroxybutyrate acid 1.83.  Urinalysis shows >1000 glucose.  Patient presents today along with her husband.  She continued to have intermittent hypotension however her syncope has not recurred since the last ED visit.  She  is at she ate something wrong that day and started having nausea and vomiting afterward.  I recommended a repeat basic metabolic panel to check her potassium to make sure her potassium has improved back to normal.  She was unable to tolerate the 5 mg 3 times a day dosing of medicine.  She has self reduced her midodrine down to 2.5 mg 3 times a day.  Since she is doing okay on the current dose, I recommended continue the 2.5 mg 3 times a day dosing for now.  I discussed with her regarding potentially take extra 2.5 mg as needed based on her blood pressure.   Past Medical History:  Diagnosis Date   Allergy    seasonal   Anxiety    B12 deficiency    Chest pain 04/05/2017   CHF (congestive heart failure) ([place])    Diabetes mellitus type 1, uncontrolled ([place])    "dr. 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) 10/29/2013   Leg cramping    "@ night" (09/18/2013)   Migraine    "once q couple months" (11/26/2018)   Osteoporosis    Peripheral neuropathy    Umbilical hernia    unrepaired (09/18/2013)  Past Surgical History:  Procedure Laterality Date   BIOPSY  06/03/2019   Procedure: BIOPSY;  Surgeon: Jackquline Denmark, MD;  Location: [facility];  Service: Endoscopy;;   BIOPSY  08/17/2020   Procedure: BIOPSY;  Surgeon: Thornton Park, MD;  Location: [facility];  Service: Gastroenterology;;   [place]   ENTEROSCOPY N/A 06/03/2019   Procedure: ENTEROSCOPY;  Surgeon: Jackquline Denmark, MD;  Location: [facility];  Service: Endoscopy;  Laterality: N/A;   ESOPHAGOGASTRODUODENOSCOPY (EGD) WITH PROPOFOL N/A 08/17/2020   Procedure: ESOPHAGOGASTRODUODENOSCOPY (EGD) WITH PROPOFOL;  Surgeon: Thornton Park, MD;  Location: [facility];  Service: Gastroenterology;  Laterality: N/A;   LAPAROSCOPIC ASSISTED VAGINAL HYSTERECTOMY  09/23/2012   Procedure: LAPAROSCOPIC ASSISTED VAGINAL HYSTERECTOMY;  Surgeon: Linda Hedges, DO;  Location: [facility];  Service: Gynecology;  Laterality: N/A;  pull Dr Gregor Hams instrument   LEFT HEART CATH AND CORONARY ANGIOGRAPHY N/A 02/11/2017   Procedure: Left Heart Cath and Coronary Angiography;  Surgeon: Lorretta Harp, MD;  Location: [facility];  Service: Cardiovascular;  Laterality: N/A;   TUBAL LIGATION Bilateral 1992    Current Medications: Current Meds  Medication Sig   Continuous Blood Gluc Sensor (DEXCOM G6 SENSOR) MISC INJECT 1 SENSOR INTO SKIN EVERY 10 DAYS   Continuous Blood Gluc Transmit (DEXCOM G6 TRANSMITTER) MISC USE AS DIRECTED FOR CONTINUOUS GLUCOSE MONITORING. REUSE TRANSMITTER X 90DAYS THEN DISCARD & REPLACE   dicyclomine (BENTYL) 10 MG capsule TAKE 1 CAPSULE (10 MG TOTAL) BY MOUTH 2 (TWO) TIMES DAILY AS NEEDED FOR SPASMS.   fexofenadine (ALLEGRA) 180 MG tablet Take 1 tablet (180 mg total) by mouth daily.   fluticasone (FLONASE) 50 MCG/ACT nasal spray Place 2 sprays into both nostrils daily.   gabapentin (NEURONTIN) 300 MG capsule Take 300 mg by mouth 3 (three) times daily.   insulin lispro (HUMALOG KWIKPEN) 100 UNIT/ML KwikPen Inject into skin 3 times a day at meal time according to the following sliding scale:  27 units max per day Dx E11.43   Insulin Pen Needle (PEN NEEDLES) 31G X 8 MM MISC Use to inject insulin 4 times a day.  Dx: E11.10   Lancets (ONETOUCH ULTRASOFT) lancets Use to check blood sugar daily as needed.  Dx: E11.10   levothyroxine (SYNTHROID) 175 MCG tablet Take 175 mcg by mouth daily.   metoCLOPramide (REGLAN) 5 MG tablet Take 5mg  before breakfast lunch and dinner and 10mg  at bedtime   metoprolol succinate (TOPROL-XL) 25 MG 24 hr tablet Take 1 tablet (25 mg total) by mouth daily.   ondansetron (ZOFRAN) 4 MG tablet Take 1 tablet (4 mg total) by mouth  every 6 (six) hours as needed for nausea or vomiting.   pantoprazole (PROTONIX) 40 MG tablet Take 1 tablet (40 mg total) by mouth 2 (two) times daily before a meal.   promethazine (PHENERGAN) 25 MG tablet TAKE 1 TABLET BY MOUTH EVERY 8 HOUR AS NEEDED FOR NAUSEA AND VOMITING.   rosuvastatin (CRESTOR) 40 MG tablet TAKE 1 TABLET BY MOUTH EVERY DAY   sertraline (ZOLOFT) 100 MG tablet TAKE 1 TABLET BY MOUTH EVERY DAY   TOUJEO SOLOSTAR 300 UNIT/ML Solostar Pen Inject 18 Units into the skin at bedtime.   [DISCONTINUED] midodrine (PROAMATINE) 2.5 MG tablet Take 2.5 mg by mouth in the morning, at noon, and at bedtime.     Allergies:   Codeine   Social History   Socioeconomic History   Marital status: Married    Spouse name: Not on file   Number  of children: 2   Years of education: Not on file   Highest education level: Not on file  Occupational History   Occupation: UNEMPLOYED    Employer: UNEMPLOYED    Comment: homemaker  Tobacco Use   Smoking status: Former    Packs/day: 0.12    Years: 10.00    Pack years: 1.20    Types: Cigarettes    Quit date: 12/10/1990    Years since quitting: 30.7   Smokeless tobacco: Never  Vaping Use   Vaping Use: Never used  Substance and Sexual Activity   Alcohol use: Never    Alcohol/week: 0.0 standard drinks   Drug use: Yes    Types: Marijuana    Comment: last use 2 days ago   Sexual activity: Not Currently  Other Topics Concern   Not on file  Social History Narrative   Regular exercise- no    Diet- lots of mountain dew, limits fast foods    Social Determinants of Health   Financial Resource Strain: Not on file  Food Insecurity: Not on file  Transportation Needs: Not on file  Physical Activity: Not on file  Stress: Not on file  Social Connections: Not on file     Family History: The patient's family history includes Alzheimer's disease in her maternal grandmother; Coronary artery disease in her maternal grandmother; Diabetes in her brother,  maternal grandmother, and another family member; Heart attack in her maternal grandfather; Heart disease in an other family member; Heart failure in her brother; Heart failure (age of onset: 67) in her mother. There is no history of Colon polyps, Esophageal cancer, Liver cancer, Pancreatic cancer, Stomach cancer, Crohn's disease, or Rectal cancer.  ROS:   Please see the history of present illness.     All other systems reviewed and are negative.  EKGs/Labs/Other Studies Reviewed:    The following studies were reviewed today:  Myoview 07/20/2020 Nuclear stress EF: 64%. The left ventricular ejection fraction is normal (55-65%). There was no ST segment deviation noted during stress. The study is normal. This is a low risk study. No ischemia. Inferior and apical hypokinesis. Suspect that this is artifact. Consider echo to assess wall motion.  EKG:  EKG is ordered today.  The ekg ordered today demonstrates normal sinus rhythm, no significant ST-T wave changes  Recent Labs: 09/16/2020: Magnesium 1.8 06/30/2021: B Natriuretic Peptide 14.9 07/31/2021: ALT 7; Hemoglobin 16.7; Platelets 413 09/04/2021: BUN 11; Creatinine, Ser 0.61; Potassium 4.8; Sodium 138  Recent Lipid Panel    Component Value Date/Time   CHOL 197 01/27/2019 0809   TRIG 148.0 01/27/2019 0809   HDL 43.10 01/27/2019 0809   CHOLHDL 5 01/27/2019 0809   VLDL 29.6 01/27/2019 0809   LDLCALC 124 (H) 01/27/2019 0809   LDLDIRECT 206.0 07/27/2014 0745     Risk Assessment/Calculations:           Physical Exam:    VS:  BP 98/64   Pulse 91   Ht 4' 11.5" (1.511 m)   Wt 103 lb 3.2 oz (46.8 kg)   LMP 08/11/2012   SpO2 99%   BMI 20.50 kg/m     Wt Readings from Last 3 Encounters:  09/04/21 103 lb 3.2 oz (46.8 kg)  07/31/21 101 lb (45.8 kg)  07/21/21 101 lb 8 oz (46 kg)     GEN:  Well nourished, well developed in no acute distress HEENT: Normal NECK: No JVD; No carotid bruits LYMPHATICS: No lymphadenopathy CARDIAC:  RRR, no murmurs, rubs, gallops RESPIRATORY:  Clear to auscultation without rales, wheezing or rhonchi  ABDOMEN: Soft, non-tender, non-distended MUSCULOSKELETAL:  No edema; No deformity  SKIN: Warm and dry NEUROLOGIC:  Alert and oriented x 3 PSYCHIATRIC:  Normal affect   ASSESSMENT:    1. Orthostatic dizziness   2. Hypokalemia   3. NICM (nonischemic cardiomyopathy) ([place])   4. Hyperlipidemia LDL goal <100   5. H/O insulin dependent diabetes mellitus    PLAN:    In order of problems listed above:  Orthostatic hypotension: She is unable to tolerate a 5 mg dose of Midrin and has since went back to 2.5 mg 3 times a day dosing.  Blood pressure continue to be borderline low.  Hypokalemia: Obtain basic metabolic panel  History of nonischemic cardiomyopathy: EF normalized.  Euvolemic on exam  Hyperlipidemia: On Crestor  Insulin-dependent diabetes mellitus: Managed by primary care provider         Medication Adjustments/Labs and Tests Ordered: Current medicines are reviewed at length with the patient today.  Concerns regarding medicines are outlined above.  Orders Placed This Encounter  Procedures   Basic metabolic panel   EKG [id number]   Meds ordered this encounter  Medications   midodrine (PROAMATINE) 2.5 MG tablet    Sig: Take 1 tablet at 7 AM, 12 NOON and 4PM    Dispense:  90 tablet    Refill:  5    Patient Instructions  Medication Instructions:  CONTINUE Midodrine 2.5 mg 3 times a day (7AM, NOON and 4PM)   *If you need a refill on your cardiac medications before your next appointment, please call your pharmacy*  Lab Work: Your physician recommends that you return for lab work in [place]:  BMET  If you have labs (blood work) drawn today and your tests are completely normal, you will receive your results only by: [employer] (if you have MyChart) OR A paper copy in the mail If you have any lab test that is abnormal or we need to change your treatment, we will  call you to review the results.  Testing/Procedures: NONE ordered at this time of appointment   Follow-Up: At [facility], you and your health needs are our priority.  As part of our continuing mission to provide you with exceptional heart care, we have created designated Provider Care Teams.  These Care Teams include your primary Cardiologist (physician) and Advanced Practice Providers (APPs -  Physician Assistants and Nurse Practitioners) who all work together to provide you with the care you need, when you need it.  Your next appointment:   4-5 month(s)  The format for your next appointment:   In Person  Provider:   Minus Breeding, MD  Other Instructions   Signed, Almyra Deforest, [place]  09/06/2021 11:13 PM    [place]

## 2021-09-04 NOTE — Patient Instructions (Addendum)
Medication Instructions:  CONTINUE Midodrine 2.5 mg 3 times a day (7AM, NOON and 4PM)   *If you need a refill on your cardiac medications before your next appointment, please call your pharmacy*  Lab Work: Your physician recommends that you return for lab work in [place]:  BMET  If you have labs (blood work) drawn today and your tests are completely normal, you will receive your results only by: [employer] (if you have MyChart) OR A paper copy in the mail If you have any lab test that is abnormal or we need to change your treatment, we will call you to review the results.  Testing/Procedures: NONE ordered at this time of appointment   Follow-Up: At [facility], you and your health needs are our priority.  As part of our continuing mission to provide you with exceptional heart care, we have created designated Provider Care Teams.  These Care Teams include your primary Cardiologist (physician) and Advanced Practice Providers (APPs -  Physician Assistants and Nurse Practitioners) who all work together to provide you with the care you need, when you need it.  Your next appointment:   4-5 month(s)  The format for your next appointment:   In Person  Provider:   Minus Breeding, MD  Other Instructions

## 2021-09-06 ENCOUNTER — Encounter: Payer: Self-pay | Admitting: Physician Assistant

## 2021-09-06 NOTE — Progress Notes (Signed)
Stable renal function and electrolyte

## 2021-09-13 ENCOUNTER — Emergency Department (HOSPITAL_BASED_OUTPATIENT_CLINIC_OR_DEPARTMENT_OTHER): Payer: 59 | Admitting: Radiology

## 2021-09-13 ENCOUNTER — Other Ambulatory Visit: Payer: Self-pay

## 2021-09-13 ENCOUNTER — Encounter (HOSPITAL_BASED_OUTPATIENT_CLINIC_OR_DEPARTMENT_OTHER): Payer: Self-pay | Admitting: Obstetrics and Gynecology

## 2021-09-13 ENCOUNTER — Emergency Department (HOSPITAL_BASED_OUTPATIENT_CLINIC_OR_DEPARTMENT_OTHER)
Admission: EM | Admit: 2021-09-13 | Discharge: 2021-09-13 | Disposition: A | Discharge status: Home / Self Care | Payer: 59 | Attending: Emergency Medicine | Admitting: Emergency Medicine

## 2021-09-13 DIAGNOSIS — Z955 Presence of coronary angioplasty implant and graft: Secondary | ICD-10-CM | POA: Insufficient documentation

## 2021-09-13 DIAGNOSIS — N182 Chronic kidney disease, stage 2 (mild): Secondary | ICD-10-CM | POA: Diagnosis not present

## 2021-09-13 DIAGNOSIS — E1149 Type 2 diabetes mellitus with other diabetic neurological complication: Secondary | ICD-10-CM | POA: Diagnosis not present

## 2021-09-13 DIAGNOSIS — M25511 Pain in right shoulder: Secondary | ICD-10-CM | POA: Insufficient documentation

## 2021-09-13 DIAGNOSIS — E039 Hypothyroidism, unspecified: Secondary | ICD-10-CM | POA: Insufficient documentation

## 2021-09-13 DIAGNOSIS — Z794 Long term (current) use of insulin: Secondary | ICD-10-CM | POA: Insufficient documentation

## 2021-09-13 DIAGNOSIS — E1143 Type 2 diabetes mellitus with diabetic autonomic (poly)neuropathy: Secondary | ICD-10-CM | POA: Insufficient documentation

## 2021-09-13 DIAGNOSIS — Z87891 Personal history of nicotine dependence: Secondary | ICD-10-CM | POA: Insufficient documentation

## 2021-09-13 DIAGNOSIS — E1110 Type 2 diabetes mellitus with ketoacidosis without coma: Secondary | ICD-10-CM | POA: Insufficient documentation

## 2021-09-13 DIAGNOSIS — Z79899 Other long term (current) drug therapy: Secondary | ICD-10-CM | POA: Diagnosis not present

## 2021-09-13 DIAGNOSIS — I130 Hypertensive heart and chronic kidney disease with heart failure and stage 1 through stage 4 chronic kidney disease, or unspecified chronic kidney disease: Secondary | ICD-10-CM | POA: Insufficient documentation

## 2021-09-13 DIAGNOSIS — I5032 Chronic diastolic (congestive) heart failure: Secondary | ICD-10-CM | POA: Diagnosis not present

## 2021-09-13 MED ORDER — NAPROXEN 375 MG PO TABS: 375.0000 mg | ORAL_TABLET | Freq: Two times a day (BID) | ORAL | 0 refills | Status: DC

## 2021-09-13 MED ORDER — KETOROLAC TROMETHAMINE 60 MG/2ML IM SOLN
30.0000 mg | Freq: Once | INTRAMUSCULAR | Status: AC
  Administered 2021-09-13: 30 mg via INTRAMUSCULAR
  Filled 2021-09-13: qty 2

## 2021-09-13 NOTE — Discharge Instructions (Addendum)
Please refer to the attached instructions. Take naproxen with food. You may also add tylenol, 1000 mg, in between (about 6 hours after) your naproxen doses. Follow-up with your primary care provider and/or orthopedics if no improvement over the next 2-3 days.

## 2021-09-13 NOTE — ED Provider Notes (Signed)
[facility] EMERGENCY DEPT Provider Note   CSN: [id number] Arrival date & time: 09/13/21  1019     History Chief Complaint  Patient presents with   Shoulder Pain    [name] [name] is a 60 y.o. female.  Patient states she awoke about 3 AM today with right shoulder pain. Pain worse with movement. No known injury. No previous injury to shoulder. Denies abdominal pain, N/V.   The history is provided by the patient. No language interpreter was used.  Shoulder Pain Location:  Arm Arm location:  R upper arm Injury: no   Pain details:    Quality:  Aching and throbbing   Radiates to:  R upper arm   Severity:  Moderate   Onset quality:  Sudden   Duration:  9 hours   Timing:  Constant Prior injury to area:  No Ineffective treatments:  Acetaminophen and rest Associated symptoms: decreased range of motion and stiffness   Associated symptoms: no fever, no numbness and no swelling       Past Medical History:  Diagnosis Date   Allergy    seasonal   Anxiety    B12 deficiency    Chest pain 04/05/2017   CHF (congestive heart failure) ([place])    Diabetes mellitus type 1, uncontrolled    "dr. 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) 10/29/2013   Leg cramping    "@ night" (09/18/2013)   Migraine    "once q couple months" (11/26/2018)   Osteoporosis    Peripheral neuropathy    Umbilical hernia    unrepaired (09/18/2013)    Patient Active Problem List   Diagnosis Date Noted   Syncope 09/02/2021   Bladder wall thickening 03/09/2021   Orthostatic hypotension 03/06/2021   Gastric erosion    Urinary frequency 02/10/2020   Chronic diastolic HF (heart failure) ([place]) 10/12/2019   Duodenal ulcer without  hemorrhage or perforation    Acute esophagitis    Routine general medical examination at a health care facility 04/10/2019   Constipation    Nausea and vomiting 03/25/2019   Tachycardia 08/04/2018   MDD (major depressive disorder), recurrent episode, moderate ([place]) 07/01/2018   Colitis 03/07/2018   Chronic chest pain    Chronic systolic CHF (congestive heart failure) ([place]) 01/14/2018   Noncompliance 01/14/2018   Migraines 12/11/2017   Phrygian cap 06/20/2017   Colitis presumed infectious    Nonischemic cardiomyopathy ([place]) 03/22/2017   NSTEMI (non-ST elevated myocardial infarction) ([place]) 02/11/2017   H. 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) 10/29/2013   Vitamin D deficiency 05/20/2012   Osteoporosis 02/01/2011   B12 deficiency 04/04/2010   GERD 04/08/2009   Generalized anxiety disorder 06/22/2008   Hypothyroidism 04/29/2008   Hyperlipidemia 04/29/2008   Irritable bowel syndrome 04/29/2008    Past Surgical History:  Procedure Laterality Date   BIOPSY  06/03/2019   Procedure: BIOPSY;  Surgeon: Jackquline Denmark, MD;  Location: [facility];  Service: Endoscopy;;   BIOPSY  08/17/2020   Procedure:  BIOPSY;  Surgeon: Thornton Park, MD;  Location: [facility];  Service: Gastroenterology;;   [place]   ENTEROSCOPY N/A 06/03/2019   Procedure: ENTEROSCOPY;  Surgeon: Jackquline Denmark, MD;  Location: [facility];  Service: Endoscopy;  Laterality: N/A;   ESOPHAGOGASTRODUODENOSCOPY (EGD) WITH PROPOFOL N/A 08/17/2020   Procedure: ESOPHAGOGASTRODUODENOSCOPY (EGD) WITH PROPOFOL;  Surgeon: Thornton Park, MD;  Location: [facility];  Service: Gastroenterology;  Laterality: N/A;   LAPAROSCOPIC ASSISTED VAGINAL HYSTERECTOMY   09/23/2012   Procedure: LAPAROSCOPIC ASSISTED VAGINAL HYSTERECTOMY;  Surgeon: Linda Hedges, DO;  Location: [facility];  Service: Gynecology;  Laterality: N/A;  pull Dr Gregor Hams instrument   LEFT HEART CATH AND CORONARY ANGIOGRAPHY N/A 02/11/2017   Procedure: Left Heart Cath and Coronary Angiography;  Surgeon: Lorretta Harp, MD;  Location: [facility];  Service: Cardiovascular;  Laterality: N/A;   TUBAL LIGATION Bilateral 1992     OB History     Gravida  2   Para  2   Term      Preterm      AB      Living  2      SAB      IAB      Ectopic      Multiple      Live Births              Family History  Problem Relation Age of Onset   Diabetes Other    Heart disease Other    Heart failure Mother 27       Her heart skipped a beat and stopped - per pt   Diabetes Maternal Grandmother    Alzheimer's disease Maternal Grandmother    Coronary artery disease Maternal Grandmother    Heart attack Maternal Grandfather    Heart failure Brother    Diabetes Brother    Colon polyps Neg Hx    Esophageal cancer Neg Hx    Liver cancer Neg Hx    Pancreatic cancer Neg Hx    Stomach cancer Neg Hx    Crohn's disease Neg Hx    Rectal cancer Neg Hx     Social History   Tobacco Use   Smoking status: Former    Packs/day: 0.12    Years: 10.00    Pack years: 1.20    Types: Cigarettes    Quit date: 12/10/1990    Years since quitting: 30.7   Smokeless tobacco: Never  Vaping Use   Vaping Use: Never used  Substance Use Topics   Alcohol use: Never    Alcohol/week: 0.0 standard drinks   Drug use: Yes    Types: Marijuana    Comment: last use 2 days ago    Home Medications Prior to Admission medications   Medication Sig Start Date End Date Taking? Authorizing Provider  dicyclomine (BENTYL) 10 MG capsule TAKE 1 CAPSULE (10 MG TOTAL) BY MOUTH 2 (TWO) TIMES DAILY AS NEEDED FOR SPASMS. 06/29/21  Yes Noralyn Pick, NP  fexofenadine (ALLEGRA) 180 MG tablet Take 1 tablet  (180 mg total) by mouth daily. 02/11/20  Yes Bedsole, Amy E, MD  gabapentin (NEURONTIN) 300 MG capsule Take 300 mg by mouth 3 (three) times daily. 10/30/20  Yes [provider]  insulin lispro (HUMALOG KWIKPEN) 100 UNIT/ML KwikPen Inject into skin 3 times a day at meal time according to the following sliding scale:  27 units max per day Dx E11.43 07/21/21  Yes Bedsole, Amy E, MD  levothyroxine (SYNTHROID) 175 MCG  tablet Take 175 mcg by mouth daily. 06/03/20  Yes [provider]  metoCLOPramide (REGLAN) 5 MG tablet Take 5mg  before breakfast lunch and dinner and 10mg  at bedtime 08/19/20  Yes Nita Sells, MD  metoprolol succinate (TOPROL-XL) 25 MG 24 hr tablet Take 1 tablet (25 mg total) by mouth daily. 11/21/20  Yes Minus Breeding, MD  midodrine (PROAMATINE) 2.5 MG tablet Take 1 tablet at 7 AM, 12 NOON and 4PM 09/04/21  Yes Almyra Deforest, PA  pantoprazole (PROTONIX) 40 MG tablet Take 1 tablet (40 mg total) by mouth 2 (two) times daily before a meal. 08/19/20  Yes Nita Sells, MD  promethazine (PHENERGAN) 25 MG tablet TAKE 1 TABLET BY MOUTH EVERY 8 HOUR AS NEEDED FOR NAUSEA AND VOMITING. 08/21/21  Yes Bedsole, Amy E, MD  rosuvastatin (CRESTOR) 40 MG tablet TAKE 1 TABLET BY MOUTH EVERY DAY 04/12/20  Yes Hochrein, Jeneen Rinks, MD  sertraline (ZOLOFT) 100 MG tablet TAKE 1 TABLET BY MOUTH EVERY DAY 11/26/19  Yes Bedsole, Amy E, MD  TOUJEO SOLOSTAR 300 UNIT/ML Solostar Pen Inject 18 Units into the skin at bedtime. 07/21/21  Yes Bedsole, Amy E, MD  Continuous Blood Gluc Sensor (DEXCOM G6 SENSOR) MISC INJECT 1 SENSOR INTO SKIN EVERY 10 DAYS 07/05/21   Bedsole, Amy E, MD  Continuous Blood Gluc Transmit (DEXCOM G6 TRANSMITTER) MISC USE AS DIRECTED FOR CONTINUOUS GLUCOSE MONITORING. REUSE TRANSMITTER X 90DAYS THEN DISCARD & REPLACE 07/05/21   Bedsole, Amy E, MD  fluticasone (FLONASE) 50 MCG/ACT nasal spray Place 2 sprays into both nostrils daily. Patient not taking: Reported on 09/13/2021 03/09/21    Jinny Sanders, MD  Insulin Pen Needle (PEN NEEDLES) 31G X 8 MM MISC Use to inject insulin 4 times a day.  Dx: E11.10 07/21/21   Jinny Sanders, MD  Lancets (ONETOUCH ULTRASOFT) lancets Use to check blood sugar daily as needed.  Dx: E11.10 12/23/17   Jinny Sanders, MD  ondansetron (ZOFRAN) 4 MG tablet Take 1 tablet (4 mg total) by mouth every 6 (six) hours as needed for nausea or vomiting. Patient not taking: Reported on 09/13/2021 06/30/21   Levin Bacon, MD  pantoprazole (PROTONIX) 20 MG tablet Take 1 tablet (20 mg total) by mouth 2 (two) times daily. 06/30/21 07/30/21  Levin Bacon, MD    Allergies    Codeine  Review of Systems   Review of Systems  Constitutional:  Negative for fever.  Musculoskeletal:  Positive for arthralgias and stiffness.  All other systems reviewed and are negative.  Physical Exam Updated Vital Signs BP 126/80   Pulse 86   Temp 98 F (36.7 C)   Resp 17   LMP 08/11/2012   SpO2 98%   Physical Exam HENT:     Head: Normocephalic.     Nose: Nose normal.  Eyes:     Conjunctiva/sclera: Conjunctivae normal.  Cardiovascular:     Rate and Rhythm: Normal rate and regular rhythm.  Pulmonary:     Effort: Pulmonary effort is normal.     Breath sounds: Normal breath sounds.  Abdominal:     Palpations: Abdomen is soft.  Musculoskeletal:        General: Tenderness present. No swelling or deformity.  Skin:    General: Skin is warm and dry.  Neurological:     Mental Status: She is alert and oriented to person, place, and time.  Psychiatric:        Mood and Affect: Mood normal.        Behavior:  Behavior normal.    ED Results / Procedures / Treatments   Labs (all labs ordered are listed, but only abnormal results are displayed) Labs Reviewed - No data to display  EKG None  Radiology DG Shoulder Right  Result Date: 09/13/2021 CLINICAL DATA:  Right shoulder pain with movement.  No known injury. EXAM: RIGHT SHOULDER - 2+ VIEW COMPARISON:   None. FINDINGS: No fracture or bone lesion. Glenohumeral and AC joints are normally spaced and aligned. No significant degenerative/arthropathic change. Soft tissues are unremarkable. IMPRESSION: Negative. Electronically Signed   By: Lajean Manes M.D.   On: 09/13/2021 12:09    Procedures Procedures   Medications Ordered in ED Medications - No data to display  ED Course  I have reviewed the triage vital signs and the nursing notes.  Pertinent labs & imaging results that were available during my care of the patient were reviewed by me and considered in my medical decision making (see chart for details).    MDM Rules/Calculators/A&P                           Patient X-Ray negative for obvious fracture or dislocation.  Pt advised to follow up with orthopedics. Patient given sling while in ED, conservative therapy recommended and discussed. Patient will be discharged home & is agreeable with above plan. Returns precautions discussed. Pt appears safe for discharge.  Final Clinical Impression(s) / ED Diagnoses Final diagnoses:  Acute pain of right shoulder    Rx / DC Orders ED Discharge Orders          Ordered    naproxen (NAPROSYN) 375 MG tablet  2 times daily        09/13/21 1254             Etta Quill, NP 09/13/21 1257    Regan Lemming, MD 09/13/21 1440

## 2021-09-13 NOTE — ED Triage Notes (Signed)
Patient reports waking up with severe right shoulder pain. Patient endorses taking tylenol and muscle rub not helping the pain. Denies chest pain, denies ShOB, denies jaw pain and denies any other symptoms.  Patient reports she believes she slept on it wrong but is unsure.

## 2021-09-30 ENCOUNTER — Other Ambulatory Visit: Payer: Self-pay | Admitting: Nurse Practitioner

## 2021-11-13 ENCOUNTER — Other Ambulatory Visit: Payer: Self-pay | Admitting: Family Medicine

## 2021-11-13 DIAGNOSIS — M7912 Myalgia of auxiliary muscles, head and neck: Secondary | ICD-10-CM | POA: Diagnosis not present

## 2021-11-13 DIAGNOSIS — M6281 Muscle weakness (generalized): Secondary | ICD-10-CM | POA: Diagnosis not present

## 2021-11-13 DIAGNOSIS — M50122 Cervical disc disorder at C5-C6 level with radiculopathy: Secondary | ICD-10-CM | POA: Diagnosis not present

## 2021-11-13 NOTE — Telephone Encounter (Signed)
Please schedule CPE with Dr. Diona Browner.

## 2021-11-13 NOTE — Telephone Encounter (Signed)
Called pt she has been scheduled for 01/05/22

## 2021-11-22 ENCOUNTER — Other Ambulatory Visit: Payer: Self-pay

## 2021-11-22 ENCOUNTER — Emergency Department (HOSPITAL_COMMUNITY): Payer: BC Managed Care – PPO

## 2021-11-22 ENCOUNTER — Observation Stay (HOSPITAL_COMMUNITY): Payer: BC Managed Care – PPO

## 2021-11-22 ENCOUNTER — Observation Stay (HOSPITAL_COMMUNITY)
Admission: EM | Admit: 2021-11-22 | Discharge: 2021-11-24 | Disposition: A | Discharge status: Home / Self Care | Payer: BC Managed Care – PPO | Attending: Emergency Medicine | Admitting: Emergency Medicine

## 2021-11-22 ENCOUNTER — Encounter (HOSPITAL_COMMUNITY): Payer: Self-pay

## 2021-11-22 DIAGNOSIS — I672 Cerebral atherosclerosis: Secondary | ICD-10-CM | POA: Diagnosis not present

## 2021-11-22 DIAGNOSIS — R110 Nausea: Secondary | ICD-10-CM | POA: Diagnosis not present

## 2021-11-22 DIAGNOSIS — Z87891 Personal history of nicotine dependence: Secondary | ICD-10-CM | POA: Insufficient documentation

## 2021-11-22 DIAGNOSIS — N182 Chronic kidney disease, stage 2 (mild): Secondary | ICD-10-CM | POA: Insufficient documentation

## 2021-11-22 DIAGNOSIS — I5042 Chronic combined systolic (congestive) and diastolic (congestive) heart failure: Secondary | ICD-10-CM | POA: Insufficient documentation

## 2021-11-22 DIAGNOSIS — I951 Orthostatic hypotension: Secondary | ICD-10-CM | POA: Diagnosis present

## 2021-11-22 DIAGNOSIS — R739 Hyperglycemia, unspecified: Secondary | ICD-10-CM

## 2021-11-22 DIAGNOSIS — R112 Nausea with vomiting, unspecified: Secondary | ICD-10-CM | POA: Diagnosis not present

## 2021-11-22 DIAGNOSIS — F331 Major depressive disorder, recurrent, moderate: Secondary | ICD-10-CM | POA: Diagnosis present

## 2021-11-22 DIAGNOSIS — Z20822 Contact with and (suspected) exposure to covid-19: Secondary | ICD-10-CM | POA: Diagnosis not present

## 2021-11-22 DIAGNOSIS — R000 Tachycardia, unspecified: Secondary | ICD-10-CM | POA: Diagnosis not present

## 2021-11-22 DIAGNOSIS — K219 Gastro-esophageal reflux disease without esophagitis: Secondary | ICD-10-CM | POA: Diagnosis not present

## 2021-11-22 DIAGNOSIS — I129 Hypertensive chronic kidney disease with stage 1 through stage 4 chronic kidney disease, or unspecified chronic kidney disease: Secondary | ICD-10-CM | POA: Diagnosis not present

## 2021-11-22 DIAGNOSIS — Z794 Long term (current) use of insulin: Secondary | ICD-10-CM | POA: Insufficient documentation

## 2021-11-22 DIAGNOSIS — E785 Hyperlipidemia, unspecified: Secondary | ICD-10-CM | POA: Diagnosis not present

## 2021-11-22 DIAGNOSIS — Z79899 Other long term (current) drug therapy: Secondary | ICD-10-CM | POA: Insufficient documentation

## 2021-11-22 DIAGNOSIS — R55 Syncope and collapse: Secondary | ICD-10-CM | POA: Diagnosis not present

## 2021-11-22 DIAGNOSIS — E876 Hypokalemia: Secondary | ICD-10-CM | POA: Diagnosis not present

## 2021-11-22 DIAGNOSIS — E1165 Type 2 diabetes mellitus with hyperglycemia: Secondary | ICD-10-CM | POA: Insufficient documentation

## 2021-11-22 DIAGNOSIS — E039 Hypothyroidism, unspecified: Secondary | ICD-10-CM | POA: Diagnosis present

## 2021-11-22 DIAGNOSIS — E1149 Type 2 diabetes mellitus with other diabetic neurological complication: Secondary | ICD-10-CM | POA: Diagnosis present

## 2021-11-22 DIAGNOSIS — R197 Diarrhea, unspecified: Secondary | ICD-10-CM | POA: Diagnosis not present

## 2021-11-22 DIAGNOSIS — R4182 Altered mental status, unspecified: Secondary | ICD-10-CM | POA: Diagnosis not present

## 2021-11-22 LAB — I-STAT VENOUS BLOOD GAS, ED
Acid-Base Excess: 8 mmol/L — ABNORMAL HIGH (ref 0.0–2.0)
Bicarbonate: 33.8 mmol/L — ABNORMAL HIGH (ref 20.0–28.0)
Calcium, Ion: 1.02 mmol/L — ABNORMAL LOW (ref 1.15–1.40)
HCT: 51 % — ABNORMAL HIGH (ref 36.0–46.0)
Hemoglobin: 17.3 g/dL — ABNORMAL HIGH (ref 12.0–15.0)
O2 Saturation: 69 %
Potassium: 2.8 mmol/L — ABNORMAL LOW (ref 3.5–5.1)
Sodium: 132 mmol/L — ABNORMAL LOW (ref 135–145)
TCO2: 35 mmol/L — ABNORMAL HIGH (ref 22–32)
pCO2, Ven: 49.1 mmHg (ref 44.0–60.0)
pH, Ven: 7.445 — ABNORMAL HIGH (ref 7.250–7.430)
pO2, Ven: 35 mmHg (ref 32.0–45.0)

## 2021-11-22 LAB — CBC WITH DIFFERENTIAL/PLATELET
Abs Immature Granulocytes: 0.08 10*3/uL — ABNORMAL HIGH (ref 0.00–0.07)
Basophils Absolute: 0 10*3/uL (ref 0.0–0.1)
Basophils Relative: 0 %
Eosinophils Absolute: 0 10*3/uL (ref 0.0–0.5)
Eosinophils Relative: 0 %
HCT: 49 % — ABNORMAL HIGH (ref 36.0–46.0)
Hemoglobin: 17 g/dL — ABNORMAL HIGH (ref 12.0–15.0)
Immature Granulocytes: 1 %
Lymphocytes Relative: 16 %
Lymphs Abs: 2.2 10*3/uL (ref 0.7–4.0)
MCH: 30.1 pg (ref 26.0–34.0)
MCHC: 34.7 g/dL (ref 30.0–36.0)
MCV: 86.7 fL (ref 80.0–100.0)
Monocytes Absolute: 1.3 10*3/uL — ABNORMAL HIGH (ref 0.1–1.0)
Monocytes Relative: 10 %
Neutro Abs: 10.3 10*3/uL — ABNORMAL HIGH (ref 1.7–7.7)
Neutrophils Relative %: 73 %
Platelets: 408 10*3/uL — ABNORMAL HIGH (ref 150–400)
RBC: 5.65 MIL/uL — ABNORMAL HIGH (ref 3.87–5.11)
RDW: 14.1 % (ref 11.5–15.5)
WBC: 14 10*3/uL — ABNORMAL HIGH (ref 4.0–10.5)
nRBC: 0 % (ref 0.0–0.2)

## 2021-11-22 LAB — COMPREHENSIVE METABOLIC PANEL
ALT: 14 U/L (ref 0–44)
AST: 23 U/L (ref 15–41)
Albumin: 4.7 g/dL (ref 3.5–5.0)
Alkaline Phosphatase: 67 U/L (ref 38–126)
Anion gap: 20 — ABNORMAL HIGH (ref 5–15)
BUN: 17 mg/dL (ref 6–20)
CO2: 28 mmol/L (ref 22–32)
Calcium: 9.7 mg/dL (ref 8.9–10.3)
Chloride: 82 mmol/L — ABNORMAL LOW (ref 98–111)
Creatinine, Ser: 1.08 mg/dL — ABNORMAL HIGH (ref 0.44–1.00)
GFR, Estimated: 60 mL/min — ABNORMAL LOW (ref >60)
Glucose, Bld: 438 mg/dL — ABNORMAL HIGH (ref 70–99)
Potassium: 2.8 mmol/L — ABNORMAL LOW (ref 3.5–5.1)
Sodium: 130 mmol/L — ABNORMAL LOW (ref 135–145)
Total Bilirubin: 1.1 mg/dL (ref 0.3–1.2)
Total Protein: 7.8 g/dL (ref 6.5–8.1)

## 2021-11-22 LAB — RESP PANEL BY RT-PCR (FLU A&B, COVID) ARPGX2
Influenza A by PCR: NEGATIVE
Influenza B by PCR: NEGATIVE
SARS Coronavirus 2 by RT PCR: NEGATIVE

## 2021-11-22 LAB — BASIC METABOLIC PANEL
Anion gap: 14 (ref 5–15)
BUN: 21 mg/dL — ABNORMAL HIGH (ref 6–20)
CO2: 23 mmol/L (ref 22–32)
Calcium: 8.8 mg/dL — ABNORMAL LOW (ref 8.9–10.3)
Chloride: 97 mmol/L — ABNORMAL LOW (ref 98–111)
Creatinine, Ser: 0.67 mg/dL (ref 0.44–1.00)
GFR, Estimated: >60 mL/min (ref >60)
Glucose, Bld: 68 mg/dL — ABNORMAL LOW (ref 70–99)
Potassium: 3 mmol/L — ABNORMAL LOW (ref 3.5–5.1)
Sodium: 134 mmol/L — ABNORMAL LOW (ref 135–145)

## 2021-11-22 LAB — CBG MONITORING, ED
Glucose-Capillary: 341 mg/dL — ABNORMAL HIGH (ref 70–99)
Glucose-Capillary: 338 mg/dL — ABNORMAL HIGH (ref 70–99)
Glucose-Capillary: 320 mg/dL — ABNORMAL HIGH (ref 70–99)
Glucose-Capillary: 235 mg/dL — ABNORMAL HIGH (ref 70–99)
Glucose-Capillary: 84 mg/dL (ref 70–99)

## 2021-11-22 LAB — LIPASE, BLOOD: Lipase: 24 U/L (ref 11–51)

## 2021-11-22 LAB — MAGNESIUM: Magnesium: 2 mg/dL (ref 1.7–2.4)

## 2021-11-22 LAB — HEMOGLOBIN A1C
Hgb A1c MFr Bld: 10.8 % — ABNORMAL HIGH (ref 4.8–5.6)
Mean Plasma Glucose: 263.26 mg/dL

## 2021-11-22 LAB — GLUCOSE, CAPILLARY: Glucose-Capillary: 141 mg/dL — ABNORMAL HIGH (ref 70–99)

## 2021-11-22 MED ORDER — ONDANSETRON 4 MG PO TBDP
4.0000 mg | ORAL_TABLET | Freq: Once | ORAL | Status: AC
  Administered 2021-11-22: 03:00:00 4 mg via ORAL
  Filled 2021-11-22: qty 1

## 2021-11-22 MED ORDER — SODIUM CHLORIDE 0.9 % IV BOLUS
1000.0000 mL | Freq: Once | INTRAVENOUS | Status: AC
  Administered 2021-11-22: 10:00:00 1000 mL via INTRAVENOUS

## 2021-11-22 MED ORDER — SODIUM CHLORIDE 0.9 % IV SOLN
12.5000 mg | Freq: Four times a day (QID) | INTRAVENOUS | Status: DC | PRN
  Administered 2021-11-22: 15:00:00 12.5 mg via INTRAVENOUS
  Filled 2021-11-22: qty 0.5

## 2021-11-22 MED ORDER — SODIUM CHLORIDE 0.9 % IV SOLN: INTRAVENOUS | Status: AC

## 2021-11-22 MED ORDER — ONDANSETRON 4 MG PO TBDP
4.0000 mg | ORAL_TABLET | Freq: Once | ORAL | Status: AC
  Administered 2021-11-22: 09:00:00 4 mg via ORAL
  Filled 2021-11-22: qty 1

## 2021-11-22 MED ORDER — PANTOPRAZOLE SODIUM 40 MG PO TBEC
40.0000 mg | DELAYED_RELEASE_TABLET | Freq: Two times a day (BID) | ORAL | Status: DC
  Administered 2021-11-22 – 2021-11-24 (×4): 40 mg via ORAL
  Filled 2021-11-22 (×4): qty 1

## 2021-11-22 MED ORDER — ONDANSETRON HCL 4 MG/2ML IJ SOLN
4.0000 mg | Freq: Four times a day (QID) | INTRAMUSCULAR | Status: DC | PRN
  Administered 2021-11-22: 4 mg via INTRAVENOUS
  Filled 2021-11-22: qty 2

## 2021-11-22 MED ORDER — ROSUVASTATIN CALCIUM 20 MG PO TABS
40.0000 mg | ORAL_TABLET | Freq: Every day | ORAL | Status: DC
  Administered 2021-11-23 – 2021-11-24 (×2): 40 mg via ORAL
  Filled 2021-11-22 (×2): qty 2

## 2021-11-22 MED ORDER — INSULIN ASPART 100 UNIT/ML IJ SOLN
0.0000 [IU] | Freq: Three times a day (TID) | INTRAMUSCULAR | Status: DC
  Administered 2021-11-22: 13:00:00 5 [IU] via SUBCUTANEOUS
  Administered 2021-11-24: 2 [IU] via SUBCUTANEOUS

## 2021-11-22 MED ORDER — POTASSIUM CHLORIDE 10 MEQ/100ML IV SOLN
10.0000 meq | Freq: Once | INTRAVENOUS | Status: AC
  Administered 2021-11-22: 10:00:00 10 meq via INTRAVENOUS
  Filled 2021-11-22: qty 100

## 2021-11-22 MED ORDER — INSULIN GLARGINE-YFGN 100 UNIT/ML ~~LOC~~ SOLN
18.0000 [IU] | Freq: Every day | SUBCUTANEOUS | Status: DC
  Administered 2021-11-22 – 2021-11-23 (×2): 18 [IU] via SUBCUTANEOUS
  Filled 2021-11-22 (×3): qty 0.18

## 2021-11-22 MED ORDER — POTASSIUM CHLORIDE CRYS ER 20 MEQ PO TBCR
40.0000 meq | EXTENDED_RELEASE_TABLET | ORAL | Status: AC
  Administered 2021-11-22: 18:00:00 40 meq via ORAL
  Filled 2021-11-22: qty 2

## 2021-11-22 MED ORDER — ONDANSETRON HCL 4 MG/2ML IJ SOLN
4.0000 mg | Freq: Once | INTRAMUSCULAR | Status: AC
  Administered 2021-11-22: 10:00:00 4 mg via INTRAVENOUS
  Filled 2021-11-22: qty 2

## 2021-11-22 MED ORDER — SERTRALINE HCL 100 MG PO TABS
100.0000 mg | ORAL_TABLET | Freq: Every day | ORAL | Status: DC
  Administered 2021-11-23 – 2021-11-24 (×2): 100 mg via ORAL
  Filled 2021-11-22 (×2): qty 1

## 2021-11-22 MED ORDER — GABAPENTIN 300 MG PO CAPS
300.0000 mg | ORAL_CAPSULE | Freq: Three times a day (TID) | ORAL | Status: DC
  Administered 2021-11-22 – 2021-11-24 (×5): 300 mg via ORAL
  Filled 2021-11-22 (×5): qty 1

## 2021-11-22 MED ORDER — LORATADINE 10 MG PO TABS
10.0000 mg | ORAL_TABLET | Freq: Every day | ORAL | Status: DC
  Administered 2021-11-23 – 2021-11-24 (×2): 10 mg via ORAL
  Filled 2021-11-22 (×2): qty 1

## 2021-11-22 MED ORDER — ENOXAPARIN SODIUM 40 MG/0.4ML IJ SOSY
40.0000 mg | PREFILLED_SYRINGE | INTRAMUSCULAR | Status: DC
  Administered 2021-11-22 – 2021-11-23 (×2): 40 mg via SUBCUTANEOUS
  Filled 2021-11-22 (×2): qty 0.4

## 2021-11-22 MED ORDER — INSULIN GLARGINE (1 UNIT DIAL) 300 UNIT/ML ~~LOC~~ SOPN: 18.0000 [IU] | PEN_INJECTOR | Freq: Every day | SUBCUTANEOUS | Status: DC

## 2021-11-22 MED ORDER — INSULIN ASPART 100 UNIT/ML IJ SOLN
4.0000 [IU] | Freq: Three times a day (TID) | INTRAMUSCULAR | Status: DC
  Administered 2021-11-23 – 2021-11-24 (×2): 4 [IU] via SUBCUTANEOUS

## 2021-11-22 MED ORDER — LORAZEPAM 2 MG/ML IJ SOLN
INTRAMUSCULAR | Status: AC
  Filled 2021-11-22: qty 1

## 2021-11-22 MED ORDER — POTASSIUM CHLORIDE CRYS ER 20 MEQ PO TBCR
40.0000 meq | EXTENDED_RELEASE_TABLET | Freq: Once | ORAL | Status: DC
  Filled 2021-11-22: qty 2

## 2021-11-22 MED ORDER — POTASSIUM CHLORIDE CRYS ER 20 MEQ PO TBCR
40.0000 meq | EXTENDED_RELEASE_TABLET | Freq: Once | ORAL | Status: AC
  Administered 2021-11-22: 40 meq via ORAL
  Filled 2021-11-22: qty 2

## 2021-11-22 MED ORDER — POTASSIUM PHOSPHATES 15 MMOLE/5ML IV SOLN: 30.0000 mmol | Freq: Once | INTRAVENOUS | Status: DC

## 2021-11-22 MED ORDER — INSULIN ASPART 100 UNIT/ML IJ SOLN
6.0000 [IU] | Freq: Once | INTRAMUSCULAR | Status: AC
  Administered 2021-11-22: 11:00:00 6 [IU] via SUBCUTANEOUS

## 2021-11-22 MED ORDER — LEVOTHYROXINE SODIUM 100 MCG PO TABS
175.0000 ug | ORAL_TABLET | Freq: Every day | ORAL | Status: DC
  Administered 2021-11-23 – 2021-11-24 (×2): 175 ug via ORAL
  Filled 2021-11-22 (×2): qty 1

## 2021-11-22 NOTE — H&P (Signed)
HISTORY AND PHYSICAL       PATIENT DETAILS Name: [name] [name] Age: 60 y.o. Sex: female Date of Birth: [birth year] Admit Date: 11/22/2021 MQK:[id number], Amy E, MD   Patient coming from: Home   CHIEF COMPLAINT:  Nausea, vomiting, diarrhea since yesterday  Syncope earlier today.  HPI: [name] [name] is a 60 y.o. female with medical history significant of insulin-dependent DM, HTN, HLD, hypothyroidism, orthostatic hypotension, history of ischemic known cardiomyopathy who presented to the hospital with a 2-day history of nausea, vomiting and diarrhea.  Per patient-she was in her usual state of health-she has not been exposed to any sick family members-she started developing numerous episodes of nausea, vomiting and diarrhea yesterday.  She denies any fever or abdominal pain.  No dysuria.  No cough.  She subsequently presented to the ED for further evaluation, earlier this morning-after she received a antiemetic medication-she briefly sustained a witnessed syncopal episode.  Per EDMD there was a question of whether patient had seizure-like activity-but was not felt to have seizures.  Labs showed hyperglycemia without DKA and hypokalemia-the hospitalist service was then asked to admit this patient for further evaluation and treatment.  While waiting in the emergency room-patient's diarrheal symptoms have significantly improved-her last BM was yesterday, she still has some nausea but no active vomiting.  ED Course: Given potassium supplementation-SQ NovoLog-hospitalist consulted.   REVIEW OF SYSTEMS:  Constitutional:   No  weight loss, night sweats,  Fevers, chills, fatigue.  HEENT:    No headaches, Dysphagia,Tooth/dental problems,Sore throat,  No sneezing, itching, ear ache, nasal congestion, post nasal drip  Cardio-vascular: No chest pain,Orthopnea, PND,lower extremity edema, anasarca, palpitations  GI:  No heartburn, indigestion, melena or hematochezia  Resp: No  shortness of breath, cough, hemoptysis,plueritic chest pain.   Skin:  No rash or lesions.  GU:  No dysuria, change in color of urine, no urgency or frequency.  No flank pain.  Musculoskeletal: No joint pain or swelling.  No decreased range of motion.  No back pain.  Endocrine: No heat intolerance, no cold intolerance, no polyuria, no polydipsia  Psych: No change in mood or affect. No depression or anxiety.  No memory loss.   ALLERGIES:   Allergies  Allergen Reactions   Codeine Hives, Anxiety and Other (See Comments)    PAST MEDICAL HISTORY: Past Medical History:  Diagnosis Date   Allergy    seasonal   Anxiety    B12 deficiency    Chest pain 04/05/2017   CHF (congestive heart failure) ([facility])    Diabetes mellitus type 1, uncontrolled    "dr. 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) 10/29/2013   Leg cramping    "@ night" (09/18/2013)   Migraine    "once q couple months" (11/26/2018)   Osteoporosis    Peripheral neuropathy    Umbilical hernia    unrepaired (09/18/2013)    PAST SURGICAL HISTORY: Past Surgical History:  Procedure Laterality Date   BIOPSY  06/03/2019   Procedure: BIOPSY;  Surgeon: Jackquline Denmark, MD;  Location: [facility];  Service: Endoscopy;;   BIOPSY  08/17/2020   Procedure: BIOPSY;  Surgeon: Thornton Park, MD;  Location: [facility];  Service: Gastroenterology;;  CESAREAN SECTION  1989   ENTEROSCOPY N/A 06/03/2019   Procedure: ENTEROSCOPY;  Surgeon: Jackquline Denmark, MD;  Location: [facility];  Service: Endoscopy;  Laterality: N/A;   ESOPHAGOGASTRODUODENOSCOPY (EGD) WITH PROPOFOL N/A 08/17/2020   Procedure: ESOPHAGOGASTRODUODENOSCOPY (EGD) WITH PROPOFOL;  Surgeon: Thornton Park,  MD;  Location: [facility];  Service: Gastroenterology;  Laterality: N/A;   LAPAROSCOPIC ASSISTED VAGINAL HYSTERECTOMY  09/23/2012   Procedure: LAPAROSCOPIC ASSISTED VAGINAL HYSTERECTOMY;  Surgeon: Linda Hedges, DO;  Location: [facility];  Service: Gynecology;  Laterality: N/A;  pull Dr Gregor Hams instrument   LEFT HEART CATH AND CORONARY ANGIOGRAPHY N/A 02/11/2017   Procedure: Left Heart Cath and Coronary Angiography;  Surgeon: Lorretta Harp, MD;  Location: [facility];  Service: Cardiovascular;  Laterality: N/A;   TUBAL LIGATION Bilateral 1992    MEDICATIONS AT HOME: Prior to Admission medications   Medication Sig Start Date End Date Taking? Authorizing Provider  Continuous Blood Gluc Sensor (DEXCOM G6 SENSOR) MISC INJECT 1 SENSOR INTO SKIN EVERY 10 DAYS 07/05/21   Bedsole, Amy E, MD  Continuous Blood Gluc Transmit (DEXCOM G6 TRANSMITTER) MISC USE AS DIRECTED FOR CONTINUOUS GLUCOSE MONITORING. REUSE TRANSMITTER X 90DAYS THEN DISCARD & REPLACE 07/05/21   Bedsole, Amy E, MD  dicyclomine (BENTYL) 10 MG capsule TAKE 1 CAPSULE BY MOUTH TWICE A DAY AS NEEDED FOR SPASMS 09/30/21   Noralyn Pick, NP  fexofenadine (ALLEGRA) 180 MG tablet Take 1 tablet (180 mg total) by mouth daily. 02/11/20   Bedsole, Amy E, MD  fluticasone (FLONASE) 50 MCG/ACT nasal spray Place 2 sprays into both nostrils daily. Patient not taking: Reported on 09/13/2021 03/09/21   Jinny Sanders, MD  gabapentin (NEURONTIN) 300 MG capsule Take 300 mg by mouth 3 (three) times daily. 10/30/20   [provider]  insulin lispro (HUMALOG KWIKPEN) 100 UNIT/ML KwikPen Inject into skin 3 times a day at meal time according to the following sliding scale:  27 units max per day Dx E11.43 07/21/21   Bedsole, Amy E, MD  Insulin Pen Needle (PEN NEEDLES) 31G X 8 MM MISC Use to inject insulin 4 times a day.  Dx: E11.10 07/21/21   Jinny Sanders, MD  Lancets (ONETOUCH ULTRASOFT) lancets Use to check blood sugar daily as needed.  Dx: E11.10  12/23/17   Jinny Sanders, MD  levothyroxine (SYNTHROID) 175 MCG tablet Take 175 mcg by mouth daily. 06/03/20   [provider]  metoCLOPramide (REGLAN) 5 MG tablet Take 5mg  before breakfast lunch and dinner and 10mg  at bedtime 08/19/20   Nita Sells, MD  metoprolol succinate (TOPROL-XL) 25 MG 24 hr tablet TAKE 1 TABLET (25 MG TOTAL) BY MOUTH DAILY. 11/13/21   Bedsole, Amy E, MD  midodrine (PROAMATINE) 2.5 MG tablet Take 1 tablet at 7 AM, 12 NOON and 4PM 09/04/21   Almyra Deforest, PA  naproxen (NAPROSYN) 375 MG tablet Take 1 tablet (375 mg total) by mouth 2 (two) times daily. 09/13/21   Etta Quill, NP  ondansetron (ZOFRAN) 4 MG tablet Take 1 tablet (4 mg total) by mouth every 6 (six) hours as needed for nausea or vomiting. Patient not taking: Reported on 09/13/2021 06/30/21   Levin Bacon, MD  pantoprazole (PROTONIX) 20 MG tablet Take 1 tablet (20 mg total) by mouth 2 (two) times daily. 06/30/21 07/30/21  Levin Bacon, MD  pantoprazole (PROTONIX) 40 MG tablet Take 1 tablet (40 mg total) by mouth 2 (two) times daily before a meal. 08/19/20   Nita Sells, MD  promethazine (PHENERGAN) 25 MG tablet TAKE 1 TABLET BY MOUTH EVERY 8 HOUR AS NEEDED FOR NAUSEA AND VOMITING. 08/21/21   Bedsole, Amy E, MD  rosuvastatin (CRESTOR) 40 MG tablet TAKE 1 TABLET BY MOUTH EVERY DAY 04/12/20   Minus Breeding, MD  sertraline (ZOLOFT) 100 MG tablet TAKE 1 TABLET BY MOUTH EVERY DAY 11/26/19   Bedsole, Amy E, MD  TOUJEO SOLOSTAR 300 UNIT/ML Solostar Pen Inject 18 Units into the skin at bedtime. 07/21/21   Jinny Sanders, MD    FAMILY HISTORY: Family History  Problem Relation Age of Onset   Diabetes Other    Heart disease Other    Heart failure Mother 46       Her heart skipped a beat and stopped - per pt   Diabetes Maternal Grandmother    Alzheimer's disease Maternal Grandmother    Coronary artery disease Maternal Grandmother    Heart attack Maternal Grandfather    Heart failure Brother     Diabetes Brother    Colon polyps Neg Hx    Esophageal cancer Neg Hx    Liver cancer Neg Hx    Pancreatic cancer Neg Hx    Stomach cancer Neg Hx    Crohn's disease Neg Hx    Rectal cancer Neg Hx       SOCIAL HISTORY:  reports that she quit smoking about 30 years ago. Her smoking use included cigarettes. She has a 1.20 pack-year smoking history. She has never used smokeless tobacco. She reports current drug use. Drug: Marijuana. She reports that she does not drink alcohol.  PHYSICAL EXAM: Blood pressure (!) 143/93, pulse (!) 104, temperature 98.5 F (36.9 C), temperature source Oral, resp. rate 20, height 4\' 11"  (1.499 m), weight 48.5 kg, last menstrual period 08/11/2012, SpO2 99 %.  General appearance :Awake, alert, not in any distress.  Eyes:, pupils equally reactive to light and accomodation,no scleral icterus.Pink conjunctiva HEENT: Atraumatic and Normocephalic Neck: supple, no JVD.  Resp:Good air entry bilaterally, no added sounds  CVS: S1 S2 regular, no murmurs.  GI: Bowel sounds present, Non tender and not distended with no gaurding, rigidity or rebound. Extremities: B/L Lower Ext shows no edema, both legs are warm to touch Neurology:  speech clear,Non focal, sensation is grossly intact. Psychiatric: Normal judgment and insight. Alert and oriented x 3. Normal mood. Musculoskeletal:gait appears to be normal.No digital cyanosis Skin:No Rash, warm and dry Wounds:N/A  LABS ON ADMISSION:  I have personally reviewed following labs and imaging studies  CBC: Recent Labs  Lab 11/22/21 0236 11/22/21 0336  WBC 14.0*  --   NEUTROABS 10.3*  --   HGB 17.0* 17.3*  HCT 49.0* 51.0*  MCV 86.7  --   PLT 408*  --     Basic Metabolic Panel: Recent Labs  Lab 11/22/21 0236 11/22/21 0336  NA 130* 132*  K 2.8* 2.8*  CL 82*  --   CO2 28  --   GLUCOSE 438*  --   BUN 17  --   CREATININE 1.08*  --   CALCIUM 9.7  --     GFR: Estimated Creatinine Clearance: 38.7 mL/min (A)  (by C-G formula based on SCr of 1.08 mg/dL (H)).  Liver Function Tests: Recent Labs  Lab 11/22/21 0236  AST 23  ALT 14  ALKPHOS 67  BILITOT 1.1  PROT 7.8  ALBUMIN 4.7   Recent Labs  Lab 11/22/21 0236  LIPASE 24   No results for input(s): AMMONIA in the last 168  hours.  Coagulation Profile: No results for input(s): INR, PROTIME in the last 168 hours.  Cardiac Enzymes: No results for input(s): CKTOTAL, CKMB, CKMBINDEX, TROPONINI in the last 168 hours.  BNP (last 3 results) No results for input(s): PROBNP in the last 8760 hours.  HbA1C: No results for input(s): HGBA1C in the last 72 hours.  CBG: Recent Labs  Lab 11/22/21 0642 11/22/21 0944 11/22/21 1128  GLUCAP 341* 338* 320*    Lipid Profile: No results for input(s): CHOL, HDL, LDLCALC, TRIG, CHOLHDL, LDLDIRECT in the last 72 hours.  Thyroid Function Tests: No results for input(s): TSH, T4TOTAL, FREET4, T3FREE, THYROIDAB in the last 72 hours.  Anemia Panel: No results for input(s): VITAMINB12, FOLATE, FERRITIN, TIBC, IRON, RETICCTPCT in the last 72 hours.  Urine analysis:    Component Value Date/Time   COLORURINE YELLOW 07/31/2021 1511   APPEARANCEUR CLEAR 07/31/2021 1511   LABSPEC 1.024 07/31/2021 1511   PHURINE 6.0 07/31/2021 1511   GLUCOSEU >1,000 (A) 07/31/2021 1511   HGBUR NEGATIVE 07/31/2021 1511   BILIRUBINUR NEGATIVE 07/31/2021 1511   BILIRUBINUR negative 03/09/2021 1123   KETONESUR 15 (A) 07/31/2021 1511   PROTEINUR TRACE (A) 07/31/2021 1511   UROBILINOGEN 0.2 03/09/2021 1123   UROBILINOGEN 0.2 10/28/2013 1723   NITRITE NEGATIVE 07/31/2021 1511   LEUKOCYTESUR NEGATIVE 07/31/2021 1511    Sepsis Labs: Lactic Acid, Venous    Component Value Date/Time   LATICACIDVEN 1.1 05/31/2019 1143     Microbiology: No results found for this or any previous visit (from the past 240 hour(s)).    RADIOLOGIC STUDIES ON ADMISSION: CT Head Wo Contrast  Result Date: 11/22/2021 CLINICAL DATA:   Provided history: Mental status change, unknown cause. Additional history provided: Nausea and hyperglycemia. EXAM: CT HEAD WITHOUT CONTRAST TECHNIQUE: Contiguous axial images were obtained from the base of the skull through the vertex without intravenous contrast. COMPARISON:  Head CT 01/15/2011. FINDINGS: Brain: Cerebral volume is normal. Unchanged small choroid plexus calcification along the roof of the anterior third ventricle. There is no acute intracranial hemorrhage. No demarcated cortical infarct. No extra-axial fluid collection. No evidence of an intracranial mass. No midline shift. Vascular: No hyperdense vessel. Atherosclerotic calcifications. Skull: Normal. Negative for fracture or focal lesion. Sinuses/Orbits: Visualized orbits show no acute finding. Trace mucosal thickening within the frontoethmoidal recesses, bilaterally. IMPRESSION: Unremarkable non-contrast CT appearance of the brain. No evidence of acute intracranial abnormality. Electronically Signed   By: Kellie Simmering D.O.   On: 11/22/2021 10:41      EKG:  Personally reviewed.  NSR  ASSESSMENT AND PLAN: Syncope: Has known history of orthostatic hypotension-presenting with nausea/vomiting and diarrhea-suspect syncope to be due to orthostatic mechanism or vasovagal syncope in the setting of ongoing nausea/vomiting.  Plans are to hydrate-monitor in telemetry and check orthostatic vital signs.  Insulin-dependent DM-2 with uncontrolled hyperglycemia: Sugars in the 400 range without DKA-this is likely due to patient missing her insulin dosage yesterday.  Plans are to place her back on her usual dosing of 18 units of glargine insulin-start [place] of NovoLog with meals and SSI.  Follow closely.    Nausea/vomiting/diarrhea: Seems to be improving-suspect this is likely a viral syndrome.  Abdominal exam is benign.  Looks like she may have a underlying history of gastroparesis that could be exacerbated by a viral prodrome.  We will continue with  antiemetics and see how she does.  If needed-we can restart her scheduled Reglan.  Hypothyroidism: Continue Synthroid  HTN: Due to suspicion for orthostatic mechanism-hold oral  metoprolol-continue to hydrate and see what her orthostatic vital signs look like.    HLD: Continue statin  Mood disorder: Appears stable-continue Zoloft   Further plan will depend as patient's clinical course evolves and further radiologic and laboratory data become available. Patient will be monitored closely.   CONSULTS: None  DVT Prophylaxis: Prophylactic Lovenox   Code Status: Full Code  Admission status:  Observation going to tele    Total time spent  55 minutes.Greater than 50% of this time was spent in counseling, explanation of diagnosis, planning of further management, and coordination of care.  Severity of illness: The appropriate patient status for this patient is OBSERVATION. Observation status is judged to be reasonable and necessary in order to provide the required intensity of service to ensure the patient's safety. The patient's presenting symptoms, physical exam findings, and initial radiographic and laboratory data in the context of their medical condition is felt to place them at decreased risk for further clinical deterioration. Furthermore, it is anticipated that the patient will be medically stable for discharge from the hospital within 2 midnights of admission. The following factors support the patient status of observation.   " The patient's presenting symptoms include syncope, nausea, vomiting and diarrhea. " The physical exam findings include dehydration. " The initial radiographic and laboratory data are hyperglycemia, hypokalemia.    Oren Binet [facility] Pager [phone]  If 7PM-7AM, please contact night-coverage  Please page via [email or web address]  Go to [email or web address] and use [facility]'s universal password to access. If you do not have the password, please  contact the hospital operator.  Locate the [facility] provider you are looking for under [facility] and page to a number that you can be directly reached. If you still have difficulty reaching the provider, please page the [facility] (Director on Call) for the Hospitalists listed on amion for assistance.  11/22/2021, 11:55 AM

## 2021-11-22 NOTE — Procedures (Signed)
Patient Name: [name] [name]  MRN: [id number]  Epilepsy Attending: Lora Havens  Referring Physician/Provider: Dr Oren Binet Date: 11/22/2021 Duration: 22.24 mins  Patient history: 60 year old female with an episode of syncope.  EEG to evaluate for seizure.  Level of alertness: Awake, asleep  AEDs during EEG study: GBP  Technical aspects: This EEG study was done with scalp electrodes positioned according to the 10-20 International system of electrode placement. Electrical activity was acquired at a sampling rate of 500Hz  and reviewed with a high frequency filter of 70Hz  and a low frequency filter of 1Hz . EEG data were recorded continuously and digitally stored.   Description: The posterior dominant rhythm consists of 8-9 Hz activity of moderate voltage (25-35 uV) seen predominantly in posterior head regions, symmetric and reactive to eye opening and eye closing. Sleep was characterized by vertex waves, sleep spindles (12 to 14 Hz), maximal frontocentral region.    Hyperventilation and photic stimulation were not performed.     IMPRESSION: This study is within normal limits. No seizures or epileptiform discharges were seen throughout the recording.  [name] [name] [name] [name]

## 2021-11-22 NOTE — ED Provider Notes (Signed)
Emergency Medicine Provider Triage Evaluation Note  [name] [name] , a 60 y.o. female  was evaluated in triage.  Pt complains of hyperglycemia.  Blood sugars into the 400s at home.  Type II diabetic.  Having some nausea, vomiting and diarrhea.  Generalized abdominal pain.  Does not radiate.  No recent illnesses.  Has not been using her home diabetes meds per EMS  Review of Systems  Positive: Nausea, vomiting, diarrhea, hyperglycemia Negative: Fever, cough, chest pain, shortness of breath  Physical Exam  BP (!) 114/95 (BP Location: Right Arm)    Pulse (!) 112    Temp 98.5 F (36.9 C) (Oral)    Resp 15    LMP 08/11/2012    SpO2 99%  Gen:   Awake, no distress   Resp:  Normal effort  MSK:   Moves extremities without difficulty  Other:    Medical Decision Making  Medically screening exam initiated at 1:58 AM.  Appropriate orders placed.  [name] [name] was informed that the remainder of the evaluation will be completed by another provider, this initial triage assessment does not replace that evaluation, and the importance of remaining in the ED until their evaluation is complete.  Hyperglycemia, N/V/D   [name] [name] A, PA-C 11/22/21 0158    Veryl Speak, MD 11/22/21 [phone]

## 2021-11-22 NOTE — ED Notes (Signed)
Pt still dry heaving after being given zofran. Cannot tolerate PO potassium at this time.

## 2021-11-22 NOTE — Plan of Care (Signed)
  Problem: Education: Goal: Knowledge of General Education information will improve Description Including pain rating scale, medication(s)/side effects and non-pharmacologic comfort measures Outcome: Progressing   

## 2021-11-22 NOTE — Progress Notes (Signed)
EEG complete - results pending 

## 2021-11-22 NOTE — Progress Notes (Signed)
New Admission Note:  Arrival Method: Stretcher Mental Orientation: Alert and oriented x 3-4 Telemetry: Box 12 Assessment: Completed Skin:  Warm and dry IV: NS Infusing  Pain: Denies  Tubes: N/A Safety Measures: Safety Fall Prevention Plan initiated.  Admission: Completed 5 M  Orientation: Patient has been orientated to the room, unit and the staff. Welcome booklet given.  Family: N/A Orders have been reviewed and implemented. Will continue to monitor the patient. Call light has been placed within reach and bed alarm has been activated.   Sima Matas BSN, RN  Phone Number: [phone]

## 2021-11-22 NOTE — ED Provider Notes (Signed)
[name] DE[facility]MENT Provider Note   CSN: [id number] Arrival date & time: 11/22/21  0153     History Chief Complaint  Patient presents with   Hyperglycemia   Nausea    [name] is a 60 y.o. female.  60 year old female with prior medical history as detailed below presents for evaluation.  Patient with history of type II diabetes.  She is insulin-dependent.  Patient initially presented with complaint of nausea, vomiting, and some loose stools.  The symptoms started within the last 24 hours.  Patient reports that she did not take her regular insulin.  She reports blood sugars into the 400s at home.  I was called into her room emergently by staff.  Patient had been in the waiting room.  She became unresponsive.  There was possible seizure-like activity.  Patient was placed immediately into a room.  Upon my arrival the patient was being placed on the monitor.  Patient was initially unresponsive.  Strong sternal rub elicited verbal response.  At that point the patient was alert, oriented, and answering questions appropriately.  No noticeable postictal period noted.  Patient reports that she "just felt bad and might of had a seizure."  The history is provided by the patient and medical records.  Hyperglycemia Blood sugar level PTA:  400 Severity:  Moderate Onset quality:  Gradual Duration:  1 day Timing:  Constant Progression:  Worsening Chronicity:  New Diabetes status:  Controlled with insulin     Past Medical History:  Diagnosis Date   Allergy    seasonal   Anxiety    B12 deficiency    Chest pain 04/05/2017   CHF (congestive heart failure) ([place])    Diabetes mellitus type 1, uncontrolled    "dr. 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) 10/29/2013   Leg cramping    "@ night" (09/18/2013)   Migraine    "once q couple months" (11/26/2018)   Osteoporosis    Peripheral neuropathy    Umbilical hernia    unrepaired (09/18/2013)    Patient Active Problem List   Diagnosis Date Noted   Syncope 09/02/2021   Bladder wall thickening 03/09/2021   Orthostatic hypotension 03/06/2021   Gastric erosion    Urinary frequency 02/10/2020   Chronic diastolic HF (heart failure) ([place]) 10/12/2019   Duodenal ulcer without hemorrhage or perforation    Acute esophagitis    Routine general medical examination at a health care facility 04/10/2019   Constipation    Nausea and vomiting 03/25/2019   Tachycardia 08/04/2018   MDD (major depressive disorder), recurrent episode, moderate ([place]) 07/01/2018   Colitis 03/07/2018   Chronic chest pain    Chronic systolic CHF (congestive heart failure) ([place]) 01/14/2018   Noncompliance 01/14/2018   Migraines 12/11/2017   Phrygian cap 06/20/2017   Colitis presumed infectious    Nonischemic cardiomyopathy ([place]) 03/22/2017   NSTEMI (non-ST elevated myocardial infarction) ([place]) 02/11/2017   H. pylori infection 02/09/2017   Hypomagnesemia 02/09/2017   Gastroparesis due to DM ([place]) 01/22/2017   Hyponatremia 07/24/2016   Type 2 diabetes mellitus with neurological complications ([place]) 43/27/6147   Paroxysmal A-fib ([place]) 02/01/2015   Kidney disease, chronic, stage 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) 10/29/2013  Vitamin D deficiency 05/20/2012   Osteoporosis 02/01/2011   B12 deficiency 04/04/2010   GERD 04/08/2009   Generalized anxiety disorder 06/22/2008   Hypothyroidism 04/29/2008   Hyperlipidemia 04/29/2008   Irritable bowel syndrome 04/29/2008    Past Surgical History:  Procedure Laterality Date   BIOPSY  06/03/2019   Procedure: BIOPSY;  Surgeon: Jackquline Denmark, MD;  Location: [facility];  Service: Endoscopy;;   BIOPSY  08/17/2020   Procedure:  BIOPSY;  Surgeon: Thornton Park, MD;  Location: [facility];  Service: Gastroenterology;;   [place]   ENTEROSCOPY N/A 06/03/2019   Procedure: ENTEROSCOPY;  Surgeon: Jackquline Denmark, MD;  Location: [facility];  Service: Endoscopy;  Laterality: N/A;   ESOPHAGOGASTRODUODENOSCOPY (EGD) WITH PROPOFOL N/A 08/17/2020   Procedure: ESOPHAGOGASTRODUODENOSCOPY (EGD) WITH PROPOFOL;  Surgeon: Thornton Park, MD;  Location: [facility];  Service: Gastroenterology;  Laterality: N/A;   LAPAROSCOPIC ASSISTED VAGINAL HYSTERECTOMY  09/23/2012   Procedure: LAPAROSCOPIC ASSISTED VAGINAL HYSTERECTOMY;  Surgeon: Linda Hedges, DO;  Location: [facility];  Service: Gynecology;  Laterality: N/A;  pull Dr Gregor Hams instrument   LEFT HEART CATH AND CORONARY ANGIOGRAPHY N/A 02/11/2017   Procedure: Left Heart Cath and Coronary Angiography;  Surgeon: Lorretta Harp, MD;  Location: [facility];  Service: Cardiovascular;  Laterality: N/A;   TUBAL LIGATION Bilateral 1992     OB History     Gravida  2   Para  2   Term      Preterm      AB      Living  2      SAB      IAB      Ectopic      Multiple      Live Births              Family History  Problem Relation Age of Onset   Diabetes Other    Heart disease Other    Heart failure Mother 28       Her heart skipped a beat and stopped - per pt   Diabetes Maternal Grandmother    Alzheimer's disease Maternal Grandmother    Coronary artery disease Maternal Grandmother    Heart attack Maternal Grandfather    Heart failure Brother    Diabetes Brother    Colon polyps Neg Hx    Esophageal cancer Neg Hx    Liver cancer Neg Hx    Pancreatic cancer Neg Hx    Stomach cancer Neg Hx    Crohn's disease Neg Hx    Rectal cancer Neg Hx     Social History   Tobacco Use   Smoking status: Former    Packs/day: 0.12    Years: 10.00    Pack years: 1.20    Types: Cigarettes    Quit date: 12/10/1990    Years since quitting: 30.9    Smokeless tobacco: Never  Vaping Use   Vaping Use: Never used  Substance Use Topics   Alcohol use: Never    Alcohol/week: 0.0 standard drinks   Drug use: Yes    Types: Marijuana    Comment: last use 2 days ago    Home Medications Prior to Admission medications   Medication Sig Start Date End Date Taking? Authorizing Provider  Continuous Blood Gluc Sensor (DEXCOM G6 SENSOR) MISC INJECT 1 SENSOR INTO SKIN EVERY 10 DAYS 07/05/21   Bedsole, Amy E, MD  Continuous Blood Gluc Transmit (DEXCOM G6 TRANSMITTER) MISC USE AS DIRECTED FOR CONTINUOUS GLUCOSE MONITORING.  REUSE TRANSMITTER X 90DAYS THEN DISCARD & REPLACE 07/05/21   Bedsole, Amy E, MD  dicyclomine (BENTYL) 10 MG capsule TAKE 1 CAPSULE BY MOUTH TWICE A DAY AS NEEDED FOR SPASMS 09/30/21   Noralyn Pick, NP  fexofenadine (ALLEGRA) 180 MG tablet Take 1 tablet (180 mg total) by mouth daily. 02/11/20   Bedsole, Amy E, MD  fluticasone (FLONASE) 50 MCG/ACT nasal spray Place 2 sprays into both nostrils daily. Patient not taking: Reported on 09/13/2021 03/09/21   Jinny Sanders, MD  gabapentin (NEURONTIN) 300 MG capsule Take 300 mg by mouth 3 (three) times daily. 10/30/20   [provider]  insulin lispro (HUMALOG KWIKPEN) 100 UNIT/ML KwikPen Inject into skin 3 times a day at meal time according to the following sliding scale:  27 units max per day Dx E11.43 07/21/21   Bedsole, Amy E, MD  Insulin Pen Needle (PEN NEEDLES) 31G X 8 MM MISC Use to inject insulin 4 times a day.  Dx: E11.10 07/21/21   Jinny Sanders, MD  Lancets (ONETOUCH ULTRASOFT) lancets Use to check blood sugar daily as needed.  Dx: E11.10 12/23/17   Jinny Sanders, MD  levothyroxine (SYNTHROID) 175 MCG tablet Take 175 mcg by mouth daily. 06/03/20   [provider]  metoCLOPramide (REGLAN) 5 MG tablet Take 5mg  before breakfast lunch and dinner and 10mg  at bedtime 08/19/20   Nita Sells, MD  metoprolol succinate (TOPROL-XL) 25 MG 24 hr tablet TAKE 1 TABLET (25  MG TOTAL) BY MOUTH DAILY. 11/13/21   Bedsole, Amy E, MD  midodrine (PROAMATINE) 2.5 MG tablet Take 1 tablet at 7 AM, 12 NOON and 4PM 09/04/21   Almyra Deforest, PA  naproxen (NAPROSYN) 375 MG tablet Take 1 tablet (375 mg total) by mouth 2 (two) times daily. 09/13/21   Etta Quill, NP  ondansetron (ZOFRAN) 4 MG tablet Take 1 tablet (4 mg total) by mouth every 6 (six) hours as needed for nausea or vomiting. Patient not taking: Reported on 09/13/2021 06/30/21   Levin Bacon, MD  pantoprazole (PROTONIX) 20 MG tablet Take 1 tablet (20 mg total) by mouth 2 (two) times daily. 06/30/21 07/30/21  Levin Bacon, MD  pantoprazole (PROTONIX) 40 MG tablet Take 1 tablet (40 mg total) by mouth 2 (two) times daily before a meal. 08/19/20   Nita Sells, MD  promethazine (PHENERGAN) 25 MG tablet TAKE 1 TABLET BY MOUTH EVERY 8 HOUR AS NEEDED FOR NAUSEA AND VOMITING. 08/21/21   Bedsole, Amy E, MD  rosuvastatin (CRESTOR) 40 MG tablet TAKE 1 TABLET BY MOUTH EVERY DAY 04/12/20   Minus Breeding, MD  sertraline (ZOLOFT) 100 MG tablet TAKE 1 TABLET BY MOUTH EVERY DAY 11/26/19   Bedsole, Amy E, MD  TOUJEO SOLOSTAR 300 UNIT/ML Solostar Pen Inject 18 Units into the skin at bedtime. 07/21/21   Jinny Sanders, MD    Allergies    Codeine  Review of Systems   Review of Systems  All other systems reviewed and are negative.  Physical Exam Updated Vital Signs BP (!) 146/94 (BP Location: Right Arm)    Pulse (!) 108    Temp 98.5 F (36.9 C) (Oral)    Resp 14    Ht 4\' 11"  (1.499 m)    Wt 48.5 kg    LMP 08/11/2012    SpO2 100%    BMI 21.61 kg/m   Physical Exam Vitals and nursing note reviewed.  Constitutional:      General: She is not in acute distress.  Appearance: Normal appearance. She is well-developed.  HENT:     Head: Normocephalic and atraumatic.     Mouth/Throat:     Mouth: Mucous membranes are dry.  Eyes:     Conjunctiva/sclera: Conjunctivae normal.     Pupils: Pupils are equal, round, and reactive  to light.  Cardiovascular:     Rate and Rhythm: Normal rate and regular rhythm.     Heart sounds: Normal heart sounds.  Pulmonary:     Effort: Pulmonary effort is normal. No respiratory distress.     Breath sounds: Normal breath sounds.  Abdominal:     General: There is no distension.     Palpations: Abdomen is soft.     Tenderness: There is no abdominal tenderness.  Musculoskeletal:        General: No deformity. Normal range of motion.     Cervical back: Normal range of motion and neck supple.  Skin:    General: Skin is warm and dry.  Neurological:     General: No focal deficit present.     Mental Status: She is alert and oriented to person, place, and time.    ED Results / Procedures / Treatments   Labs (all labs ordered are listed, but only abnormal results are displayed) Labs Reviewed  CBC WITH DIFFERENTIAL/PLATELET - Abnormal; Notable for the following components:      Result Value   WBC 14.0 (*)    RBC 5.65 (*)    Hemoglobin 17.0 (*)    HCT 49.0 (*)    Platelets 408 (*)    Neutro Abs 10.3 (*)    Monocytes Absolute 1.3 (*)    Abs Immature Granulocytes 0.08 (*)    All other components within normal limits  COMPREHENSIVE METABOLIC PANEL - Abnormal; Notable for the following components:   Sodium 130 (*)    Potassium 2.8 (*)    Chloride 82 (*)    Glucose, Bld 438 (*)    Creatinine, Ser 1.08 (*)    GFR, Estimated 60 (*)    Anion gap 20 (*)    All other components within normal limits  CBG MONITORING, ED - Abnormal; Notable for the following components:   Glucose-Capillary 341 (*)    All other components within normal limits  I-STAT VENOUS BLOOD GAS, ED - Abnormal; Notable for the following components:   pH, Ven 7.445 (*)    Bicarbonate 33.8 (*)    TCO2 35 (*)    Acid-Base Excess 8.0 (*)    Sodium 132 (*)    Potassium 2.8 (*)    Calcium, Ion 1.02 (*)    HCT 51.0 (*)    Hemoglobin 17.3 (*)    All other components within normal limits  CBG MONITORING, ED -  Abnormal; Notable for the following components:   Glucose-Capillary 338 (*)    All other components within normal limits  RESP PANEL BY RT-PCR (FLU A&B, COVID) ARPGX2  LIPASE, BLOOD  URINALYSIS, ROUTINE W REFLEX MICROSCOPIC  CBG MONITORING, ED    EKG EKG Interpretation  Date/Time:  Wednesday November 22 2021 10:01:15 EST Ventricular Rate:  102 PR Interval:  179 QRS Duration: 92 QT Interval:  363 QTC Calculation: 473 R Axis:   100 Text Interpretation: Sinus tachycardia Anterior infarct, old Borderline repolarization abnormality Confirmed by Dene Gentry [phone]) on 11/22/2021 10:02:53 AM  Radiology No results found.  Procedures Procedures   Medications Ordered in ED Medications  potassium chloride SA (KLOR-CON M) CR tablet 40 mEq (0 mEq Oral Hold  11/22/21 0920)  potassium chloride 10 mEq in 100 mL IVPB (10 mEq Intravenous New Bag/Given 11/22/21 1007)  ondansetron (ZOFRAN-ODT) disintegrating tablet 4 mg (4 mg Oral Given 11/22/21 0243)  ondansetron (ZOFRAN-ODT) disintegrating tablet 4 mg (4 mg Oral Given 11/22/21 0910)  sodium chloride 0.9 % bolus 1,000 mL (1,000 mLs Intravenous New Bag/Given 11/22/21 1004)  ondansetron (ZOFRAN) injection 4 mg (4 mg Intravenous Given 11/22/21 1005)    ED Course  I have reviewed the triage vital signs and the nursing notes.  Pertinent labs & imaging results that were available during my care of the patient were reviewed by me and considered in my medical decision making (see chart for details).    MDM Rules/Calculators/A&P                           MDM  MSE complete  [name] was evaluated in Emergency Department on 11/22/2021 for the symptoms described in the history of present illness. She was evaluated in the context of the global COVID-19 pandemic, which necessitated consideration that the patient might be at risk for infection with the SARS-CoV-2 virus that causes COVID-19. Institutional protocols and algorithms that pertain  to the evaluation of patients at risk for COVID-19 are in a state of rapid change based on information released by regulatory bodies including the CDC and federal and state organizations. These policies and algorithms were followed during the patient's care in the ED.  Patient initially presented with complaint of nausea, vomiting, and diarrhea.  Patient admits that she has been noncompliant with previously prescribed insulin.  Initial labs demonstrated elevated glucose and open anion gap.  Additionally patient with hypokalemia on arrival.  Patient had an unclear syncopal event that occurred in the waiting room.  Described event and witnessed behavior the patient is not consistent with seizure.  Patient was neurologically intact immediately after the reported waiting room collapse.  Patient is in need of admission for elevated blood sugar, and open anion gap.  Potassium repletion initiated.  IV fluids initiated.  Case discussed with the admitting hospitalist service who will evaluate for admission.  Final Clinical Impression(s) / ED Diagnoses Final diagnoses:  Hyperglycemia  Hypokalemia  Nausea  Nausea and vomiting, unspecified vomiting type    Rx / DC Orders ED Discharge Orders     None        Valarie Merino, MD 11/22/21 1115

## 2021-11-22 NOTE — ED Triage Notes (Signed)
Pt here for nausea and hyperglycemia. Pt stated that she had a blood sugar of 400. Pt also stated that she did not take her antidiabetic medications today because she did not feel like it; and because she was nauseous.

## 2021-11-22 NOTE — ED Notes (Signed)
Pt seizing, brought back to room 5. Francia Greaves, MD at bedside.

## 2021-11-22 NOTE — Progress Notes (Signed)
HOSPITAL MEDICINE OVERNIGHT EVENT NOTE    Notified by nursing that patient's mews score is yellow.   Chart reviewed, patient currently hospitalized for syncope with symptoms of nausea vomiting and diarrhea thought to be viral in etiology.  Nursing reports the patient is currently nauseated but exhibits no other symptoms.  Patient is currently tachycardic with heart rates in the 110's however upon further review this is essentially where the patient has been throughout the hospitalization thus far.  Based on the current work-up, there is no obvious evidence of an underlying infectious process.  Continuing current care plan including supportive care of nausea vomiting and diarrhea.  Continuing intravenous fluids.  [name] Emerald  MD [facility]

## 2021-11-23 ENCOUNTER — Observation Stay (HOSPITAL_BASED_OUTPATIENT_CLINIC_OR_DEPARTMENT_OTHER): Payer: BC Managed Care – PPO

## 2021-11-23 DIAGNOSIS — Z87891 Personal history of nicotine dependence: Secondary | ICD-10-CM | POA: Diagnosis not present

## 2021-11-23 DIAGNOSIS — I951 Orthostatic hypotension: Secondary | ICD-10-CM | POA: Diagnosis not present

## 2021-11-23 DIAGNOSIS — E1149 Type 2 diabetes mellitus with other diabetic neurological complication: Secondary | ICD-10-CM

## 2021-11-23 DIAGNOSIS — E039 Hypothyroidism, unspecified: Secondary | ICD-10-CM | POA: Diagnosis not present

## 2021-11-23 DIAGNOSIS — Z79899 Other long term (current) drug therapy: Secondary | ICD-10-CM | POA: Diagnosis not present

## 2021-11-23 DIAGNOSIS — I5042 Chronic combined systolic (congestive) and diastolic (congestive) heart failure: Secondary | ICD-10-CM | POA: Diagnosis not present

## 2021-11-23 DIAGNOSIS — R55 Syncope and collapse: Secondary | ICD-10-CM | POA: Diagnosis not present

## 2021-11-23 DIAGNOSIS — R112 Nausea with vomiting, unspecified: Secondary | ICD-10-CM

## 2021-11-23 DIAGNOSIS — I129 Hypertensive chronic kidney disease with stage 1 through stage 4 chronic kidney disease, or unspecified chronic kidney disease: Secondary | ICD-10-CM | POA: Diagnosis not present

## 2021-11-23 DIAGNOSIS — N182 Chronic kidney disease, stage 2 (mild): Secondary | ICD-10-CM | POA: Diagnosis not present

## 2021-11-23 DIAGNOSIS — Z794 Long term (current) use of insulin: Secondary | ICD-10-CM | POA: Diagnosis not present

## 2021-11-23 DIAGNOSIS — Z20822 Contact with and (suspected) exposure to covid-19: Secondary | ICD-10-CM | POA: Diagnosis not present

## 2021-11-23 DIAGNOSIS — E1165 Type 2 diabetes mellitus with hyperglycemia: Secondary | ICD-10-CM | POA: Diagnosis not present

## 2021-11-23 DIAGNOSIS — R739 Hyperglycemia, unspecified: Secondary | ICD-10-CM | POA: Diagnosis not present

## 2021-11-23 LAB — BASIC METABOLIC PANEL
Anion gap: 10 (ref 5–15)
BUN: 17 mg/dL (ref 6–20)
CO2: 25 mmol/L (ref 22–32)
Calcium: 8.6 mg/dL — ABNORMAL LOW (ref 8.9–10.3)
Chloride: 102 mmol/L (ref 98–111)
Creatinine, Ser: 0.7 mg/dL (ref 0.44–1.00)
GFR, Estimated: >60 mL/min (ref >60)
Glucose, Bld: 102 mg/dL — ABNORMAL HIGH (ref 70–99)
Potassium: 4.5 mmol/L (ref 3.5–5.1)
Sodium: 137 mmol/L (ref 135–145)

## 2021-11-23 LAB — CBC
HCT: 42.5 % (ref 36.0–46.0)
Hemoglobin: 14.7 g/dL (ref 12.0–15.0)
MCH: 30 pg (ref 26.0–34.0)
MCHC: 34.6 g/dL (ref 30.0–36.0)
MCV: 86.7 fL (ref 80.0–100.0)
Platelets: 327 10*3/uL (ref 150–400)
RBC: 4.9 MIL/uL (ref 3.87–5.11)
RDW: 14.1 % (ref 11.5–15.5)
WBC: 13 10*3/uL — ABNORMAL HIGH (ref 4.0–10.5)
nRBC: 0 % (ref 0.0–0.2)

## 2021-11-23 LAB — ECHOCARDIOGRAM COMPLETE
AR max vel: 1.59 cm2
AV Area VTI: 1.4 cm2
AV Area mean vel: 1.55 cm2
AV Mean grad: 3 mmHg
AV Peak grad: 5.5 mmHg
Ao pk vel: 1.17 m/s
Area-P 1/2: 7.02 cm2
Calc EF: 55.6 %
Height: 59 in
S' Lateral: 2.8 cm
Single Plane A2C EF: 55.1 %
Single Plane A4C EF: 58.6 %
Weight: 1774.26 oz

## 2021-11-23 LAB — GLUCOSE, CAPILLARY
Glucose-Capillary: 88 mg/dL (ref 70–99)
Glucose-Capillary: 102 mg/dL — ABNORMAL HIGH (ref 70–99)
Glucose-Capillary: 94 mg/dL (ref 70–99)
Glucose-Capillary: 116 mg/dL — ABNORMAL HIGH (ref 70–99)

## 2021-11-23 MED ORDER — MIDODRINE HCL 5 MG PO TABS
5.0000 mg | ORAL_TABLET | Freq: Three times a day (TID) | ORAL | Status: DC
  Administered 2021-11-23 – 2021-11-24 (×3): 5 mg via ORAL
  Filled 2021-11-23 (×3): qty 1

## 2021-11-23 MED ORDER — METOCLOPRAMIDE HCL 5 MG PO TABS
5.0000 mg | ORAL_TABLET | Freq: Three times a day (TID) | ORAL | Status: DC
  Administered 2021-11-23 – 2021-11-24 (×3): 5 mg via ORAL
  Filled 2021-11-23 (×3): qty 1

## 2021-11-23 NOTE — Progress Notes (Signed)
Inpatient Diabetes Program Recommendations  AACE/ADA: New Consensus Statement on Inpatient Glycemic Control  Target Ranges:  Prepandial:   less than 140 mg/dL      Peak postprandial:   less than 180 mg/dL (1-2 hours)      Critically ill patients:  140 - 180 mg/dL    Latest Reference Range & Units 11/23/21 06:39  Glucose-Capillary 70 - 99 mg/dL 88    Latest Reference Range & Units 11/22/21 09:44 11/22/21 11:28 11/22/21 12:19 11/22/21 17:30 11/22/21 19:50  Glucose-Capillary 70 - 99 mg/dL 338 (H) 320 (H) 235 (H) 84 141 (H)    Latest Reference Range & Units 07/21/21 16:03 11/22/21 19:18  Hemoglobin A1C 4.8 - 5.6 % 11.0 ! 10.8 (H)   Review of Glycemic Control  Diabetes history: DM Outpatient Diabetes medications: Toujeo 18 units QHS, Humalog per sliding scale Current orders for Inpatient glycemic control: Semglee 18 units QHS, Novolog 0-9 units TID with meals, Novolog 4 units TID with meals  Inpatient Diabetes Program Recommendations:    HbgA1C: A1C 10.8% on 11/22/21 indicating an average glucose of 263 mg/dl over the past 2-3 months. Patient reports she is taking Humalog per sliding scale only. Would recommend adding Humalog 3-4 units TID with meals for carb coverage to outpatient DM regimen (along with Humalog correction scale).  NOTE: Spoke with patient over the phone about diabetes and home regimen for diabetes control. Patient reports being followed by PCP for diabetes management and currently taking Toujeo 18 units QHS and Humalog per sliding scale TID with meals as an outpatient for diabetes control. Asked patient if she was taking a set dose of Humalog for her meal plus correction and patient reports that she only takes Humalog based on glucose per sliding scale.  Patient reports that over the last few days prior to admission she was only using Humalog.  Discussed Toujeo and Humalog insulin and explained how each insulin works. Discussed stress response on glucose during sickness and  explained that she needs to take Toujeo even during sickness but she may need to take reduced dose. Encouraged patient to talk to her PCP about any adjustments with Toujeo when she is sick and not able to eat.  Patient states that she uses a Dexcom CGM (does not currently have it on) and her glucose is usually in the 200's mg/dl all the time.  Discussed A1C results (10.5% on 11/22/21) and explained that current A1C indicates an average glucose of 263 mg/dl over the past 2-3 months. Discussed glucose and A1C goals. Discussed importance maintaining good CBG control to prevent long-term and short-term complications. Inquired about seeing an Endocrinologist and patient states she does not have an Musician but her PCP has mentioned that she may need to start seeing one. In talking patient she notes that her PCP does not review her Dexcom reports. Explained that if she gets established with an Endocrinologist, they would review Dexcom reports to help make adjustments with insulin regimen. Encouraged patient to talk with PCP about getting established with Endocrinology.   Patient verbalized understanding of information discussed and reports no further questions at this time related to diabetes.  Thanks, Barnie Alderman, RN, MSN, CDE Diabetes Coordinator Inpatient Diabetes Program [phone] (Team Pager)

## 2021-11-23 NOTE — Evaluation (Signed)
Physical Therapy Evaluation Patient Details Name: [name] [name] MRN: [id number] DOB: 1961-09-27 Today's Date: 11/23/2021  History of Present Illness  60 y.o. female presents to [facility] on 11/22/2021 with nausea, vomiting, diarrhea, and hyperglycemia. Pt experienced a syncopal episode in ED on 12/14. PMH includes DM-2, HTN, HLD, hypothyroidism, orthostatic hypotension, ischemic cardiomyopathy.  Clinical Impression  Pt presents to PT with deficits in balance. Pt reports lightheadedness with standing, with orthostatic BP during all standing activity. Pt remains able to ambulate without physical assistance, but does have a history of multiple syncopal episodes. Pt will benefit from continued acute PT services to reinforce education on efforts to limit orthostatic hypotension.       Recommendations for follow up therapy are one component of a multi-disciplinary discharge planning process, led by the attending physician.  Recommendations may be updated based on patient status, additional functional criteria and insurance authorization.  Follow Up Recommendations No PT follow up    Assistance Recommended at Discharge Intermittent Supervision/Assistance  Functional Status Assessment Patient has had a recent decline in their functional status and demonstrates the ability to make significant improvements in function in a reasonable and predictable amount of time.  Equipment Recommendations  None recommended by PT (TED hose/abdominal binder for BP management)    Recommendations for Other Services       Precautions / Restrictions Precautions Precautions: Fall Restrictions Weight Bearing Restrictions: No      Mobility  Bed Mobility Overal bed mobility: Independent                  Transfers Overall transfer level: Independent                      Ambulation/Gait Ambulation/Gait assistance: Supervision Gait Distance (Feet): 150 Feet Assistive device: None Gait  Pattern/deviations: Step-through pattern Gait velocity: functional Gait velocity interpretation: 1.31 - 2.62 ft/sec, indicative of limited community ambulator   General Gait Details: pt with steady step-through gait, no significant balance deviations noted  Stairs            Wheelchair Mobility    Modified Rankin (Stroke Patients Only)       Balance Overall balance assessment: Mild deficits observed, not formally tested                                           Pertinent Vitals/Pain Pain Assessment: No/denies pain    Home Living Family/patient expects to be discharged to:: Private residence Living Arrangements: Spouse/significant other Available Help at Discharge: Family;Available PRN/intermittently Type of Home: House Home Access: Stairs to enter Entrance Stairs-Rails: None Entrance Stairs-Number of Steps: 2   Home Layout: One level Home Equipment: Cane - quad      Prior Function Prior Level of Function : Independent/Modified Independent             Mobility Comments: PRN use of small based quad cane       Hand Dominance        Extremity/Trunk Assessment   Upper Extremity Assessment Upper Extremity Assessment: Overall WFL for tasks assessed    Lower Extremity Assessment Lower Extremity Assessment: Overall WFL for tasks assessed    Cervical / Trunk Assessment Cervical / Trunk Assessment: Normal  Communication   Communication: No difficulties  Cognition Arousal/Alertness: Awake/alert Behavior During Therapy: WFL for tasks assessed/performed Overall Cognitive Status: Within Functional Limits for tasks assessed  General Comments General comments (skin integrity, edema, etc.): orthostatic vitals: supine 113/79 (91), sitting 97/70 (80), standing 73/63 (68)- pt reports symptoms of lightheadedness and instability, 81/70 (76) standing after 3 minutes, 85/61 (71) BP after  ambulation, 120/77 (89) BP in supine at end of session. Pt reports symptoms from initial stand until returned to supine.    Exercises     Assessment/Plan    PT Assessment Patient needs continued PT services  PT Problem List Decreased activity tolerance;Decreased mobility;Cardiopulmonary status limiting activity;Decreased knowledge of precautions       PT Treatment Interventions DME instruction;Gait training;Stair training;Functional mobility training;Therapeutic activities;Therapeutic exercise;Balance training;Patient/family education    PT Goals (Current goals can be found in the Care Plan section)  Acute Rehab PT Goals Patient Stated Goal: to go home PT Goal Formulation: With patient Time For Goal Achievement: 12/07/21 Potential to Achieve Goals: Good Additional Goals Additional Goal #1: Pt will verbalize precautions to limit orthostatic hypotension, including LE exercise, increased time between positional changes, and use of TED Hose or compression stockings.    Frequency Min 3X/week   Barriers to discharge        Co-evaluation               AM-PAC PT "6 Clicks" Mobility  Outcome Measure Help needed turning from your back to your side while in a flat bed without using bedrails?: None Help needed moving from lying on your back to sitting on the side of a flat bed without using bedrails?: None Help needed moving to and from a bed to a chair (including a wheelchair)?: None Help needed standing up from a chair using your arms (e.g., wheelchair or bedside chair)?: None Help needed to walk in hospital room?: A Little Help needed climbing 3-5 steps with a railing? : A Little 6 Click Score: 22    End of Session   Activity Tolerance: Patient tolerated treatment well Patient left: in bed;with call bell/phone within reach Nurse Communication: Mobility status PT Visit Diagnosis: Other abnormalities of gait and mobility (R26.89)    Time: 8182-9937 PT Time Calculation  (min) (ACUTE ONLY): 23 min   Charges:   PT Evaluation $PT Eval Low Complexity: [place], PT, DPT Acute Rehabilitation Pager: [phone] Office [phone]   Zenaida Niece 11/23/2021, 4:17 PM

## 2021-11-23 NOTE — Progress Notes (Signed)
PROGRESS NOTE        PATIENT DETAILS Name: [name] [name] Age: 60 y.o. Sex: female Date of Birth: 1961-11-01 Admit Date: 11/22/2021 Admitting Physician Evalee Mutton Kristeen Mans, MD SWN:IOEVOJJ, Amy E, MD  Brief Narrative: Patient is a 61 y.o. female with history of DM-2, HTN, HLD, hypothyroidism, orthostatic hypotension, ischemic cardiomyopathy-who presented with 2-day history of nausea, vomiting, diarrhea and uncontrolled hyperglycemia.  While in the emergency room patient sustained a syncopal episode.  She was subsequently admitted to the hospitalist service.  Subjective: She feels better-nausea, vomiting and diarrhea have resolved.  She was very symptomatic-and unsteady when she stood up for orthostatic vital signs this morning.  Objective: Vitals: Blood pressure 95/72, pulse (!) 106, temperature 98.5 F (36.9 C), temperature source Oral, resp. rate 16, height 4\' 11"  (1.499 m), weight 50.3 kg, last menstrual period 08/11/2012, SpO2 98 %.   Exam: Gen Exam:Alert awake-not in any distress HEENT:atraumatic, normocephalic Chest: B/L clear to auscultation anteriorly CVS:S1S2 regular Abdomen:soft non tender, non distended Extremities:no edema Neurology: Non focal Skin: no rash  Pertinent Labs/Radiology: Recent Labs  Lab 11/22/21 0236 11/22/21 0336 11/23/21 0440  WBC 14.0*  --  13.0*  HGB 17.0*   < > 14.7  PLT 408*  --  327  NA 130*   < > 137  K 2.8*   < > 4.5  CREATININE 1.08*   < > 0.70  AST 23  --   --   ALT 14  --   --   ALKPHOS 67  --   --   BILITOT 1.1  --   --    < > = values in this interval not displayed.    Assessment/Plan: Syncope: Probably due to orthostatic hypotension-very symptomatic this morning when she stood up for orthostatic vital signs (known history of orthostatic hypotension).  Telemetry negative for arrhythmia, echo with preserved EF.  Continue gentle hydration today-restart midodrine-and reassess on 12/16 if able to ambulate  safely before consideration of discharge.  DM-2 with uncontrolled hyperglycemia: CBGs significantly better after restarting patient's usual insulin regimen.  Continue 18 units of Semglee daily, Premeal NovoLog and SSI.  Follow and adjust.  Recent Labs    11/22/21 1730 11/22/21 1950 11/23/21 0639  GLUCAP 84 141* 88    Gastroenteritis: Likely viral-symptoms have resolved.  History of diabetic gastroparesis: Nauseous this morning-resume usual dosing of Reglan.  History of orthostatic hypotension: See above-probably due to autonomic dysfunction in the setting of diabetes.  HTN: Holding metoprolol-patient with orthostatic vital signs.  Follow BP trend.  HLD: Continue statin  Hypothyroidism: Continue Synthroid  Mood disorder: Continue Zoloft  BMI Estimated body mass index is 22.4 kg/m as calculated from the following:   Height as of this encounter: 4\' 11"  (1.499 m).   Weight as of this encounter: 50.3 kg.    Procedures: None Consults: None DVT Prophylaxis: Lovenox Code Status:Full code  Family Communication: None at bedside  Time spent: 35 minutes-Greater than 50% of this time was spent in counseling, explanation of diagnosis, planning of further management, and coordination of care.   Disposition Plan: Status is: Observation  The patient will require care spanning > 2 midnights and should be moved to inpatient because: Continues to have symptomatic orthostatic hypotension-unsteady-continuing IV fluids and restarting midodrine.  Needs to ensure stability of gait for  safe disposition-as patient high risk for falls.  Diet: Diet Order             Diet full liquid Room service appropriate? Yes; Fluid consistency: Thin  Diet effective now                     Antimicrobial agents: Anti-infectives (From admission, onward)    None        MEDICATIONS: Scheduled Meds:  enoxaparin (LOVENOX) injection  40 mg Subcutaneous Q24H   gabapentin  300 mg Oral TID    insulin aspart  0-9 Units Subcutaneous TID WC   insulin aspart  4 Units Subcutaneous TID WC   insulin glargine-yfgn  18 Units Subcutaneous QHS   levothyroxine  175 mcg Oral Q0600   loratadine  10 mg Oral Daily   pantoprazole  40 mg Oral BID AC   rosuvastatin  40 mg Oral Daily   sertraline  100 mg Oral Daily   Continuous Infusions:  sodium chloride 75 mL/hr at 11/23/21 0117   promethazine (PHENERGAN) injection (IM or IVPB) Stopped (11/22/21 1527)   PRN Meds:.ondansetron (ZOFRAN) IV, promethazine (PHENERGAN) injection (IM or IVPB)   I have personally reviewed following labs and imaging studies  LABORATORY DATA: CBC: Recent Labs  Lab 11/22/21 0236 11/22/21 0336 11/23/21 0440  WBC 14.0*  --  13.0*  NEUTROABS 10.3*  --   --   HGB 17.0* 17.3* 14.7  HCT 49.0* 51.0* 42.5  MCV 86.7  --  86.7  PLT 408*  --  562    Basic Metabolic Panel: Recent Labs  Lab 11/22/21 0236 11/22/21 0336 11/22/21 1919 11/23/21 0440  NA 130* 132* 134* 137  K 2.8* 2.8* 3.0* 4.5  CL 82*  --  97* 102  CO2 28  --  23 25  GLUCOSE 438*  --  68* 102*  BUN 17  --  21* 17  CREATININE 1.08*  --  0.67 0.70  CALCIUM 9.7  --  8.8* 8.6*  MG  --   --  2.0  --     GFR: Estimated Creatinine Clearance: 52.3 mL/min (by C-G formula based on SCr of 0.7 mg/dL).  Liver Function Tests: Recent Labs  Lab 11/22/21 0236  AST 23  ALT 14  ALKPHOS 67  BILITOT 1.1  PROT 7.8  ALBUMIN 4.7   Recent Labs  Lab 11/22/21 0236  LIPASE 24   No results for input(s): AMMONIA in the last 168 hours.  Coagulation Profile: No results for input(s): INR, PROTIME in the last 168 hours.  Cardiac Enzymes: No results for input(s): CKTOTAL, CKMB, CKMBINDEX, TROPONINI in the last 168 hours.  BNP (last 3 results) No results for input(s): PROBNP in the last 8760 hours.  Lipid Profile: No results for input(s): CHOL, HDL, LDLCALC, TRIG, CHOLHDL, LDLDIRECT in the last 72 hours.  Thyroid Function Tests: No results for  input(s): TSH, T4TOTAL, FREET4, T3FREE, THYROIDAB in the last 72 hours.  Anemia Panel: No results for input(s): VITAMINB12, FOLATE, FERRITIN, TIBC, IRON, RETICCTPCT in the last 72 hours.  Urine analysis:    Component Value Date/Time   COLORURINE YELLOW 07/31/2021 [place] 07/31/2021 1511   LABSPEC 1.024 07/31/2021 1511   PHURINE 6.0 07/31/2021 1511   GLUCOSEU >1,000 (A) 07/31/2021 1511   HGBUR NEGATIVE 07/31/2021 1511   BILIRUBINUR NEGATIVE 07/31/2021 1511   BILIRUBINUR negative 03/09/2021 1123   KETONESUR 15 (A) 07/31/2021 1511   PROTEINUR TRACE (A) 07/31/2021 1511   UROBILINOGEN 0.2 03/09/2021 1123   UROBILINOGEN 0.2 10/28/2013 1723  NITRITE NEGATIVE 07/31/2021 1511   LEUKOCYTESUR NEGATIVE 07/31/2021 1511    Sepsis Labs: Lactic Acid, Venous    Component Value Date/Time   LATICACIDVEN 1.1 05/31/2019 1143    MICROBIOLOGY: Recent Results (from the past 240 hour(s))  Resp Panel by RT-PCR (Flu A&B, Covid) Nasopharyngeal Swab     Status: None   Collection Time: 11/22/21 10:08 AM   Specimen: Nasopharyngeal Swab; Nasopharyngeal(NP) swabs in vial transport medium  Result Value Ref Range Status   SARS Coronavirus 2 by RT PCR NEGATIVE NEGATIVE Final    Comment: (NOTE) SARS-CoV-2 target nucleic acids are NOT DETECTED.  The SARS-CoV-2 RNA is generally detectable in upper respiratory specimens during the acute phase of infection. The lowest concentration of SARS-CoV-2 viral copies this assay can detect is 138 copies/mL. A negative result does not preclude SARS-Cov-2 infection and should not be used as the sole basis for treatment or other patient management decisions. A negative result may occur with  improper specimen collection/handling, submission of specimen other than nasopharyngeal swab, presence of viral mutation(s) within the areas targeted by this assay, and inadequate number of viral copies(<138 copies/mL). A negative result must be combined  with clinical observations, patient history, and epidemiological information. The expected result is Negative.  Fact Sheet for Patients:  EntrepreneurPulse.com.au  Fact Sheet for Healthcare Providers:  IncredibleEmployment.be  This test is no t yet approved or cleared by the [place] FDA and  has been authorized for detection and/or diagnosis of SARS-CoV-2 by FDA under an Emergency Use Authorization (EUA). This EUA will remain  in effect (meaning this test can be used) for the duration of the COVID-19 declaration under Section 564(b)(1) of the Act, 21 U.S.C.section 360bbb-3(b)(1), unless the authorization is terminated  or revoked sooner.       Influenza A by PCR NEGATIVE NEGATIVE Final   Influenza B by PCR NEGATIVE NEGATIVE Final    Comment: (NOTE) The Xpert Xpress SARS-CoV-2/FLU/RSV plus assay is intended as an aid in the diagnosis of influenza from Nasopharyngeal swab specimens and should not be used as a sole basis for treatment. Nasal washings and aspirates are unacceptable for Xpert Xpress SARS-CoV-2/FLU/RSV testing.  Fact Sheet for Patients: EntrepreneurPulse.com.au  Fact Sheet for Healthcare Providers: IncredibleEmployment.be  This test is not yet approved or cleared by the [place] FDA and has been authorized for detection and/or diagnosis of SARS-CoV-2 by FDA under an Emergency Use Authorization (EUA). This EUA will remain in effect (meaning this test can be used) for the duration of the COVID-19 declaration under Section 564(b)(1) of the Act, 21 U.S.C. section 360bbb-3(b)(1), unless the authorization is terminated or revoked.  Performed at [facility], [place] [address], [place], [place] [address]     RADIOLOGY STUDIES/RESULTS: CT Head Wo Contrast  Result Date: 11/22/2021 CLINICAL DATA:  Provided history: Mental status change, unknown cause. Additional history provided:  Nausea and hyperglycemia. EXAM: CT HEAD WITHOUT CONTRAST TECHNIQUE: Contiguous axial images were obtained from the base of the skull through the vertex without intravenous contrast. COMPARISON:  Head CT 01/15/2011. FINDINGS: Brain: Cerebral volume is normal. Unchanged small choroid plexus calcification along the roof of the anterior third ventricle. There is no acute intracranial hemorrhage. No demarcated cortical infarct. No extra-axial fluid collection. No evidence of an intracranial mass. No midline shift. Vascular: No hyperdense vessel. Atherosclerotic calcifications. Skull: Normal. Negative for fracture or focal lesion. Sinuses/Orbits: Visualized orbits show no acute finding. Trace mucosal thickening within the frontoethmoidal recesses, bilaterally. IMPRESSION: Unremarkable non-contrast CT appearance  of the brain. No evidence of acute intracranial abnormality. Electronically Signed   By: Kellie Simmering D.O.   On: 11/22/2021 10:41   EEG adult  Result Date: 11/22/2021 Lora Havens, MD     11/22/2021  2:36 PM Patient Name: [name] [name] MRN: [id number] Epilepsy Attending: Lora Havens Referring Physician/Provider: Dr Oren Binet Date: 11/22/2021 Duration: 22.24 mins Patient history: 60 year old female with an episode of syncope.  EEG to evaluate for seizure. Level of alertness: Awake, asleep AEDs during EEG study: GBP Technical aspects: This EEG study was done with scalp electrodes positioned according to the 10-20 International system of electrode placement. Electrical activity was acquired at a sampling rate of 500Hz  and reviewed with a high frequency filter of 70Hz  and a low frequency filter of 1Hz . EEG data were recorded continuously and digitally stored. Description: The posterior dominant rhythm consists of 8-9 Hz activity of moderate voltage (25-35 uV) seen predominantly in posterior head regions, symmetric and reactive to eye opening and eye closing. Sleep was characterized by vertex waves,  sleep spindles (12 to 14 Hz), maximal frontocentral region.    Hyperventilation and photic stimulation were not performed.   IMPRESSION: This study is within normal limits. No seizures or epileptiform discharges were seen throughout the recording. Lora Havens   ECHOCARDIOGRAM COMPLETE  Result Date: 11/23/2021    ECHOCARDIOGRAM REPORT   Patient Name:   [name] [name] Date of Exam: 11/23/2021 Medical Rec #:  [id number]      Height:       59.0 in Accession #:    [id number]     Weight:       110.9 lb Date of Birth:  [birth year]      BSA:          1.435 m Patient Age:    80 years       BP:           117/80 mmHg Patient Gender: F              HR:           91 bpm. Exam Location:  Inpatient Procedure: 2D Echo, Cardiac Doppler and Color Doppler Indications:    R55 Syncope  History:        Patient has prior history of Echocardiogram examinations, most                 recent 01/13/2019. Cardiomyopathy, Previous Myocardial Infarction,                 Arrythmias:Tachycardia, Signs/Symptoms:Hypotension; Risk                 Factors:Diabetes.  Sonographer:    Glo Herring Referring Phys: [place]  1. Left ventricular ejection fraction, by estimation, is 55 to 60%. The left ventricle has normal function. The left ventricle has no regional wall motion abnormalities. Left ventricular diastolic parameters are consistent with Grade I diastolic dysfunction (impaired relaxation).  2. Right ventricular systolic function is normal. The right ventricular size is normal. There is normal pulmonary artery systolic pressure.  3. The mitral valve is normal in structure. Trivial mitral valve regurgitation. No evidence of mitral stenosis.  4. The aortic valve is tricuspid. Aortic valve regurgitation is not visualized. No aortic stenosis is present.  5. The inferior vena cava is normal in size with greater than 50% respiratory variability, suggesting right atrial pressure of 3 mmHg. FINDINGS  Left Ventricle: Left  ventricular ejection fraction, by  estimation, is 55 to 60%. The left ventricle has normal function. The left ventricle has no regional wall motion abnormalities. The left ventricular internal cavity size was normal in size. There is  no left ventricular hypertrophy. Left ventricular diastolic parameters are consistent with Grade I diastolic dysfunction (impaired relaxation). Indeterminate filling pressures. Right Ventricle: The right ventricular size is normal. No increase in right ventricular wall thickness. Right ventricular systolic function is normal. There is normal pulmonary artery systolic pressure. The tricuspid regurgitant velocity is 1.06 m/s, and  with an assumed right atrial pressure of 3 mmHg, the estimated right ventricular systolic pressure is 7.5 mmHg. Left Atrium: Left atrial size was normal in size. Right Atrium: Right atrial size was normal in size. Pericardium: There is no evidence of pericardial effusion. Mitral Valve: The mitral valve is normal in structure. Trivial mitral valve regurgitation. No evidence of mitral valve stenosis. Tricuspid Valve: The tricuspid valve is normal in structure. Tricuspid valve regurgitation is trivial. No evidence of tricuspid stenosis. Aortic Valve: The aortic valve is tricuspid. Aortic valve regurgitation is not visualized. No aortic stenosis is present. Aortic valve mean gradient measures 3.0 mmHg. Aortic valve peak gradient measures 5.5 mmHg. Aortic valve area, by VTI measures 1.40 cm. Pulmonic Valve: The pulmonic valve was normal in structure. Pulmonic valve regurgitation is not visualized. No evidence of pulmonic stenosis. Aorta: The aortic root is normal in size and structure. Venous: The inferior vena cava is normal in size with greater than 50% respiratory variability, suggesting right atrial pressure of 3 mmHg. IAS/Shunts: No atrial level shunt detected by color flow Doppler.  LEFT VENTRICLE PLAX 2D LVIDd:         3.90 cm     Diastology LVIDs:          2.80 cm     LV e' medial:    6.53 cm/s LV PW:         0.80 cm     LV E/e' medial:  10.6 LV IVS:        0.80 cm     LV e' lateral:   6.64 cm/s LVOT diam:     1.80 cm     LV E/e' lateral: 10.4 LV SV:         37 LV SV Index:   26 LVOT Area:     2.54 cm  LV Volumes (MOD) LV vol d, MOD A2C: 47.7 ml LV vol d, MOD A4C: 52.4 ml LV vol s, MOD A2C: 21.4 ml LV vol s, MOD A4C: 21.7 ml LV SV MOD A2C:     26.3 ml LV SV MOD A4C:     52.4 ml LV SV MOD BP:      28.0 ml RIGHT VENTRICLE             IVC RV Basal diam:  2.50 cm     IVC diam: 1.80 cm RV S prime:     11.20 cm/s LEFT ATRIUM             Index        RIGHT ATRIUM          Index LA diam:        2.60 cm 1.81 cm/m   RA Area:     6.95 cm LA Vol (A2C):   33.4 ml 23.27 ml/m  RA Volume:   10.60 ml 7.38 ml/m LA Vol (A4C):   24.8 ml 17.28 ml/m LA Biplane Vol: 31.1 ml 21.67 ml/m  AORTIC VALVE  PULMONIC VALVE AV Area (Vmax):    1.59 cm     PV Vmax:       0.80 m/s AV Area (Vmean):   1.55 cm     PV Peak grad:  2.6 mmHg AV Area (VTI):     1.40 cm AV Vmax:           117.00 cm/s AV Vmean:          85.300 cm/s AV VTI:            0.264 m AV Peak Grad:      5.5 mmHg AV Mean Grad:      3.0 mmHg LVOT Vmax:         73.10 cm/s LVOT Vmean:        51.800 cm/s LVOT VTI:          0.145 m LVOT/AV VTI ratio: 0.55  AORTA Ao Root diam: 2.80 cm Ao Asc diam:  2.80 cm MITRAL VALVE               TRICUSPID VALVE MV Area (PHT): 7.02 cm    TR Peak grad:   4.5 mmHg MV Decel Time: 108 msec    TR Vmax:        106.00 cm/s MV E velocity: 69.00 cm/s MV A velocity: 79.30 cm/s  SHUNTS MV E/A ratio:  0.87        Systemic VTI:  0.14 m                            Systemic Diam: 1.80 cm Skeet Latch MD Electronically signed by Skeet Latch MD Signature Date/Time: 11/23/2021/11:33:51 AM    Final      LOS: 0 days   Oren Binet, MD  [facility]    To contact the attending provider between 7A-7P or the covering provider during after hours 7P-7A, please log into the web  site [email or web address] and access using universal [place] password for that web site. If you do not have the password, please call the hospital operator.  11/23/2021, 11:39 AM

## 2021-11-23 NOTE — Plan of Care (Signed)
  Problem: Clinical Measurements: Goal: Diagnostic test results will improve Outcome: Progressing   

## 2021-11-23 NOTE — Progress Notes (Signed)
Patient said '' I felt a moderate lightheadedness when we seated her at the bedside for her orthostatic blood pressure.When she stood up,she was wobbly and unable to sustained standing three minutes standing position as well as she felt same lightheadedness ,her systolic blood pressure dropped to 95. MD made aware.

## 2021-11-23 NOTE — Care Management Obs Status (Signed)
[facility] NOTIFICATION   Patient Details  Name: [name] [name] MRN: [id number] Date of Birth: 05/26/1961   Medicare Observation Status Notification Given:  Yes    Tom-Johnson, Renea Ee, RN 11/23/2021, 5:24 PM

## 2021-11-24 DIAGNOSIS — I5042 Chronic combined systolic (congestive) and diastolic (congestive) heart failure: Secondary | ICD-10-CM | POA: Diagnosis not present

## 2021-11-24 DIAGNOSIS — N182 Chronic kidney disease, stage 2 (mild): Secondary | ICD-10-CM | POA: Diagnosis not present

## 2021-11-24 DIAGNOSIS — I129 Hypertensive chronic kidney disease with stage 1 through stage 4 chronic kidney disease, or unspecified chronic kidney disease: Secondary | ICD-10-CM | POA: Diagnosis not present

## 2021-11-24 DIAGNOSIS — Z794 Long term (current) use of insulin: Secondary | ICD-10-CM | POA: Diagnosis not present

## 2021-11-24 DIAGNOSIS — E039 Hypothyroidism, unspecified: Secondary | ICD-10-CM | POA: Diagnosis not present

## 2021-11-24 DIAGNOSIS — E876 Hypokalemia: Secondary | ICD-10-CM

## 2021-11-24 DIAGNOSIS — E785 Hyperlipidemia, unspecified: Secondary | ICD-10-CM | POA: Diagnosis not present

## 2021-11-24 DIAGNOSIS — R739 Hyperglycemia, unspecified: Secondary | ICD-10-CM | POA: Diagnosis not present

## 2021-11-24 DIAGNOSIS — Z79899 Other long term (current) drug therapy: Secondary | ICD-10-CM | POA: Diagnosis not present

## 2021-11-24 DIAGNOSIS — R55 Syncope and collapse: Secondary | ICD-10-CM | POA: Diagnosis not present

## 2021-11-24 DIAGNOSIS — Z20822 Contact with and (suspected) exposure to covid-19: Secondary | ICD-10-CM | POA: Diagnosis not present

## 2021-11-24 DIAGNOSIS — Z87891 Personal history of nicotine dependence: Secondary | ICD-10-CM | POA: Diagnosis not present

## 2021-11-24 DIAGNOSIS — E1165 Type 2 diabetes mellitus with hyperglycemia: Secondary | ICD-10-CM | POA: Diagnosis not present

## 2021-11-24 LAB — GLUCOSE, CAPILLARY
Glucose-Capillary: 105 mg/dL — ABNORMAL HIGH (ref 70–99)
Glucose-Capillary: 156 mg/dL — ABNORMAL HIGH (ref 70–99)

## 2021-11-24 NOTE — Discharge Summary (Signed)
PATIENT DETAILS Name: [name] [name] Age: 60 y.o. Sex: female Date of Birth: 1961-05-08 MRN: [id number]. Admitting Physician: Jonetta Osgood, MD UTM:[id number], Mervyn Gay, MD  Admit Date: 11/22/2021 Discharge date: 11/24/2021  Recommendations for Outpatient Follow-up:  Follow up with PCP in 1-2 weeks Please obtain CMP/CBC in one week Metoprolol on hold-resume when able.   Admitted From:  Home  Disposition: [place]: No  Equipment/Devices: None  Discharge Condition: Stable  CODE STATUS: FULL CODE  Diet recommendation:  Diet Order             Diet - low sodium heart healthy           Diet Carb Modified           Diet heart healthy/carb modified Room service appropriate? Yes; Fluid consistency: Thin  Diet effective now                    Brief Summary: Patient is a 60 y.o. female with history of DM-2, HTN, HLD, hypothyroidism, orthostatic hypotension, ischemic cardiomyopathy-who presented with 2-day history of nausea, vomiting, diarrhea and uncontrolled hyperglycemia.  While in the emergency room patient sustained a syncopal episode.  She was subsequently admitted to the hospitalist service.  Brief Hospital Course: Syncope: Probably due to orthostatic hypotension-given her longstanding history of orthostatic hypotension.  She was very symptomatic with orthostatic hypotension till 12/15.  Telemetry was negative.  Echo with preserved EF.  EEG negative for seizures.  Suspect underlying orthostatic hypotension worsened due to GI loss from gastroenteritis.  She was treated with IV fluids-restarted on midodrine-although orthostatic on 12/16-she is not symptomatic.  Per patient-she has a longstanding history of orthostatic hypotension-and understands lifestyle modifications/changes and needed to deal with this issue.  Stable for discharge-as she is no longer symptomatic.   DM-2 with uncontrolled hyperglycemia: CBGs significantly better after restarting  patient's usual insulin regimen.  Continue her usual outpatient regimen on discharge.  Gastroenteritis: Likely viral-symptoms have resolved.   History of diabetic gastroparesis: Nauseous this morning-Reglan has been resumed.   History of orthostatic hypotension: See above-probably due to autonomic dysfunction in the setting of diabetes.   HTN: Holding metoprolol-as blood pressure controlled.  PCP to reassess and resume metoprolol accordingly.   HLD: No longer on statin per patient-follow with PCP   Hypothyroidism: Continue Synthroid   Mood disorder: No longer on Zoloft for patient-follow with PCP   BMI Estimated body mass index is 22.4 kg/m as calculated from the following:   Height as of this encounter: 4\' 11"  (1.499 m).   Weight as of this encounter: 50.3 kg.    Procedures None  Discharge Diagnoses:  Principal Problem:   Syncope Active Problems:   Hypothyroidism   Hyperlipidemia   GERD   Type 2 diabetes mellitus with neurological complications ([name])   MDD (major depressive disorder), recurrent episode, moderate ([name])   Nausea and vomiting   Orthostatic hypotension   Discharge Instructions:  Activity:  As tolerated   Discharge Instructions     Call MD for:  persistant dizziness or light-headedness   Complete by: As directed    Diet - low sodium heart healthy   Complete by: As directed    Diet Carb Modified   Complete by: As directed    Increase activity slowly   Complete by: As directed       Allergies as of 11/24/2021       Reactions   Codeine Hives, Anxiety, Other (See Comments)  Medication List     STOP taking these medications    fexofenadine 180 MG tablet Commonly known as: ALLEGRA   fluticasone 50 MCG/ACT nasal spray Commonly known as: FLONASE   ondansetron 4 MG tablet Commonly known as: ZOFRAN   rosuvastatin 40 MG tablet Commonly known as: CRESTOR   sertraline 100 MG tablet Commonly known as: ZOLOFT       TAKE these  medications    cyclobenzaprine 10 MG tablet Commonly known as: FLEXERIL Take 10 mg by mouth at bedtime.   Dexcom G6 Sensor Misc INJECT 1 SENSOR INTO SKIN EVERY 10 DAYS   Dexcom G6 Transmitter Misc USE AS DIRECTED FOR CONTINUOUS GLUCOSE MONITORING. REUSE TRANSMITTER X 90DAYS THEN DISCARD & REPLACE   dicyclomine 10 MG capsule Commonly known as: BENTYL TAKE 1 CAPSULE BY MOUTH TWICE A DAY AS NEEDED FOR SPASMS What changed: See the new instructions.   gabapentin 300 MG capsule Commonly known as: NEURONTIN Take 300 mg by mouth 3 (three) times daily as needed (pain).   insulin lispro 100 UNIT/ML KwikPen Commonly known as: HumaLOG KwikPen Inject into skin 3 times a day at meal time according to the following sliding scale:  27 units max per day Dx E11.43 What changed:  how much to take how to take this when to take this additional instructions   levothyroxine 175 MCG tablet Commonly known as: SYNTHROID Take 175 mcg by mouth daily.   metoCLOPramide 5 MG tablet Commonly known as: Reglan Take 5mg  before breakfast lunch and dinner and 10mg  at bedtime   metoprolol succinate 25 MG 24 hr tablet Commonly known as: TOPROL-XL TAKE 1 TABLET (25 MG TOTAL) BY MOUTH DAILY.   midodrine 2.5 MG tablet Commonly known as: PROAMATINE Take 1 tablet at 7 AM, 12 NOON and 4PM What changed:  how much to take how to take this when to take this additional instructions   naproxen 375 MG tablet Commonly known as: NAPROSYN Take 1 tablet (375 mg total) by mouth 2 (two) times daily. What changed:  when to take this reasons to take this   onetouch ultrasoft lancets Use to check blood sugar daily as needed.  Dx: E11.10   pantoprazole 20 MG tablet Commonly known as: PROTONIX Take 1 tablet (20 mg total) by mouth 2 (two) times daily.   Pen Needles 31G X 8 MM Misc Use to inject insulin 4 times a day.  Dx: E11.10   promethazine 25 MG tablet Commonly known as: PHENERGAN TAKE 1 TABLET BY MOUTH  EVERY 8 HOUR AS NEEDED FOR NAUSEA AND VOMITING. What changed: See the new instructions.   Toujeo SoloStar 300 UNIT/ML Solostar Pen Generic drug: insulin glargine (1 Unit Dial) Inject 18 Units into the skin at bedtime.        Follow-up Information     Jinny Sanders, MD. Schedule an appointment as soon as possible for a visit in 1 week(s).   Specialty: Family Medicine Contact information: [place] [address] [phone]         Minus Breeding, MD Follow up in 2 week(s).   Specialty: Cardiology Contact information: [address] [place] [address] [phone]                Allergies  Allergen Reactions   Codeine Hives, Anxiety and Other (See Comments)      Consultations:  None    Other Procedures/Studies: CT Head Wo Contrast  Result Date: 11/22/2021 CLINICAL DATA:  Provided history:  Mental status change, unknown cause. Additional history provided: Nausea and hyperglycemia. EXAM: CT HEAD WITHOUT CONTRAST TECHNIQUE: Contiguous axial images were obtained from the base of the skull through the vertex without intravenous contrast. COMPARISON:  Head CT 01/15/2011. FINDINGS: Brain: Cerebral volume is normal. Unchanged small choroid plexus calcification along the roof of the anterior third ventricle. There is no acute intracranial hemorrhage. No demarcated cortical infarct. No extra-axial fluid collection. No evidence of an intracranial mass. No midline shift. Vascular: No hyperdense vessel. Atherosclerotic calcifications. Skull: Normal. Negative for fracture or focal lesion. Sinuses/Orbits: Visualized orbits show no acute finding. Trace mucosal thickening within the frontoethmoidal recesses, bilaterally. IMPRESSION: Unremarkable non-contrast CT appearance of the brain. No evidence of acute intracranial abnormality. Electronically Signed   By: Kellie Simmering D.O.   On: 11/22/2021 10:41   EEG adult  Result Date:  11/22/2021 Lora Havens, MD     11/22/2021  2:36 PM Patient Name: [name] TESCHNER MRN: [id number] Epilepsy Attending: Lora Havens Referring Physician/Provider: Dr Oren Binet Date: 11/22/2021 Duration: 22.24 mins Patient history: 60 year old female with an episode of syncope.  EEG to evaluate for seizure. Level of alertness: Awake, asleep AEDs during EEG study: GBP Technical aspects: This EEG study was done with scalp electrodes positioned according to the 10-20 International system of electrode placement. Electrical activity was acquired at a sampling rate of 500Hz  and reviewed with a high frequency filter of 70Hz  and a low frequency filter of 1Hz . EEG data were recorded continuously and digitally stored. Description: The posterior dominant rhythm consists of 8-9 Hz activity of moderate voltage (25-35 uV) seen predominantly in posterior head regions, symmetric and reactive to eye opening and eye closing. Sleep was characterized by vertex waves, sleep spindles (12 to 14 Hz), maximal frontocentral region.    Hyperventilation and photic stimulation were not performed.   IMPRESSION: This study is within normal limits. No seizures or epileptiform discharges were seen throughout the recording. Lora Havens   ECHOCARDIOGRAM COMPLETE  Result Date: 11/23/2021    ECHOCARDIOGRAM REPORT   Patient Name:   [name] [name] Date of Exam: 11/23/2021 Medical Rec #:  [id number]      Height:       59.0 in Accession #:    [id number]     Weight:       110.9 lb Date of Birth:  05/30/1961      BSA:          1.435 m Patient Age:    47 years       BP:           117/80 mmHg Patient Gender: F              HR:           91 bpm. Exam Location:  Inpatient Procedure: 2D Echo, Cardiac Doppler and Color Doppler Indications:    R55 Syncope  History:        Patient has prior history of Echocardiogram examinations, most                 recent 01/13/2019. Cardiomyopathy, Previous Myocardial Infarction,                  Arrythmias:Tachycardia, Signs/Symptoms:Hypotension; Risk                 Factors:Diabetes.  Sonographer:    Glo Herring Referring Phys: [place]  1. Left ventricular ejection fraction, by estimation, is 55 to 60%. The left  ventricle has normal function. The left ventricle has no regional wall motion abnormalities. Left ventricular diastolic parameters are consistent with Grade I diastolic dysfunction (impaired relaxation).  2. Right ventricular systolic function is normal. The right ventricular size is normal. There is normal pulmonary artery systolic pressure.  3. The mitral valve is normal in structure. Trivial mitral valve regurgitation. No evidence of mitral stenosis.  4. The aortic valve is tricuspid. Aortic valve regurgitation is not visualized. No aortic stenosis is present.  5. The inferior vena cava is normal in size with greater than 50% respiratory variability, suggesting right atrial pressure of 3 mmHg. FINDINGS  Left Ventricle: Left ventricular ejection fraction, by estimation, is 55 to 60%. The left ventricle has normal function. The left ventricle has no regional wall motion abnormalities. The left ventricular internal cavity size was normal in size. There is  no left ventricular hypertrophy. Left ventricular diastolic parameters are consistent with Grade I diastolic dysfunction (impaired relaxation). Indeterminate filling pressures. Right Ventricle: The right ventricular size is normal. No increase in right ventricular wall thickness. Right ventricular systolic function is normal. There is normal pulmonary artery systolic pressure. The tricuspid regurgitant velocity is 1.06 m/s, and  with an assumed right atrial pressure of 3 mmHg, the estimated right ventricular systolic pressure is 7.5 mmHg. Left Atrium: Left atrial size was normal in size. Right Atrium: Right atrial size was normal in size. Pericardium: There is no evidence of pericardial effusion. Mitral Valve: The  mitral valve is normal in structure. Trivial mitral valve regurgitation. No evidence of mitral valve stenosis. Tricuspid Valve: The tricuspid valve is normal in structure. Tricuspid valve regurgitation is trivial. No evidence of tricuspid stenosis. Aortic Valve: The aortic valve is tricuspid. Aortic valve regurgitation is not visualized. No aortic stenosis is present. Aortic valve mean gradient measures 3.0 mmHg. Aortic valve peak gradient measures 5.5 mmHg. Aortic valve area, by VTI measures 1.40 cm. Pulmonic Valve: The pulmonic valve was normal in structure. Pulmonic valve regurgitation is not visualized. No evidence of pulmonic stenosis. Aorta: The aortic root is normal in size and structure. Venous: The inferior vena cava is normal in size with greater than 50% respiratory variability, suggesting right atrial pressure of 3 mmHg. IAS/Shunts: No atrial level shunt detected by color flow Doppler.  LEFT VENTRICLE PLAX 2D LVIDd:         3.90 cm     Diastology LVIDs:         2.80 cm     LV e' medial:    6.53 cm/s LV PW:         0.80 cm     LV E/e' medial:  10.6 LV IVS:        0.80 cm     LV e' lateral:   6.64 cm/s LVOT diam:     1.80 cm     LV E/e' lateral: 10.4 LV SV:         37 LV SV Index:   26 LVOT Area:     2.54 cm  LV Volumes (MOD) LV vol d, MOD A2C: 47.7 ml LV vol d, MOD A4C: 52.4 ml LV vol s, MOD A2C: 21.4 ml LV vol s, MOD A4C: 21.7 ml LV SV MOD A2C:     26.3 ml LV SV MOD A4C:     52.4 ml LV SV MOD BP:      28.0 ml RIGHT VENTRICLE             IVC RV Basal diam:  2.50 cm     IVC diam: 1.80 cm RV S prime:     11.20 cm/s LEFT ATRIUM             Index        RIGHT ATRIUM          Index LA diam:        2.60 cm 1.81 cm/m   RA Area:     6.95 cm LA Vol (A2C):   33.4 ml 23.27 ml/m  RA Volume:   10.60 ml 7.38 ml/m LA Vol (A4C):   24.8 ml 17.28 ml/m LA Biplane Vol: 31.1 ml 21.67 ml/m  AORTIC VALVE                    PULMONIC VALVE AV Area (Vmax):    1.59 cm     PV Vmax:       0.80 m/s AV Area (Vmean):   1.55 cm      PV Peak grad:  2.6 mmHg AV Area (VTI):     1.40 cm AV Vmax:           117.00 cm/s AV Vmean:          85.300 cm/s AV VTI:            0.264 m AV Peak Grad:      5.5 mmHg AV Mean Grad:      3.0 mmHg LVOT Vmax:         73.10 cm/s LVOT Vmean:        51.800 cm/s LVOT VTI:          0.145 m LVOT/AV VTI ratio: 0.55  AORTA Ao Root diam: 2.80 cm Ao Asc diam:  2.80 cm MITRAL VALVE               TRICUSPID VALVE MV Area (PHT): 7.02 cm    TR Peak grad:   4.5 mmHg MV Decel Time: 108 msec    TR Vmax:        106.00 cm/s MV E velocity: 69.00 cm/s MV A velocity: 79.30 cm/s  SHUNTS MV E/A ratio:  0.87        Systemic VTI:  0.14 m                            Systemic Diam: 1.80 cm Skeet Latch MD Electronically signed by Skeet Latch MD Signature Date/Time: 11/23/2021/11:33:51 AM    Final      TODAY-DAY OF DISCHARGE:  Subjective:   Glade Lloyd today has no headache,no chest abdominal pain,no new weakness tingling or numbness, feels much better wants to go home today.   Objective:   Blood pressure 130/88, pulse 78, temperature 98.5 F (36.9 C), temperature source Oral, resp. rate 18, height 4\' 11"  (1.499 m), weight 49 kg, last menstrual period 08/11/2012, SpO2 98 %.  Intake/Output Summary (Last 24 hours) at 11/24/2021 1122 Last data filed at 11/24/2021 0900 Gross per 24 hour  Intake 474 ml  Output 750 ml  Net -276 ml   Filed Weights   11/22/21 0207 11/22/21 1900 11/24/21 0329  Weight: 48.5 kg 50.3 kg 49 kg    Exam: Awake Alert, Oriented *3, No new F.N deficits, Normal affect [place].AT,PERRAL Supple Neck,No JVD, No cervical lymphadenopathy appriciated.  Symmetrical Chest wall movement, Good air movement bilaterally, CTAB RRR,No Gallops,Rubs or new Murmurs, No Parasternal Heave +ve B.Sounds, Abd Soft, Non tender, No organomegaly appriciated, No rebound -guarding  or rigidity. No Cyanosis, Clubbing or edema, No new Rash or bruise   PERTINENT RADIOLOGIC STUDIES: EEG adult  Result Date:  11/22/2021 Lora Havens, MD     11/22/2021  2:36 PM Patient Name: ANYELIN MOGLE MRN: [id number] Epilepsy Attending: Lora Havens Referring Physician/Provider: Dr Oren Binet Date: 11/22/2021 Duration: 22.24 mins Patient history: 60 year old female with an episode of syncope.  EEG to evaluate for seizure. Level of alertness: Awake, asleep AEDs during EEG study: GBP Technical aspects: This EEG study was done with scalp electrodes positioned according to the 10-20 International system of electrode placement. Electrical activity was acquired at a sampling rate of 500Hz  and reviewed with a high frequency filter of 70Hz  and a low frequency filter of 1Hz . EEG data were recorded continuously and digitally stored. Description: The posterior dominant rhythm consists of 8-9 Hz activity of moderate voltage (25-35 uV) seen predominantly in posterior head regions, symmetric and reactive to eye opening and eye closing. Sleep was characterized by vertex waves, sleep spindles (12 to 14 Hz), maximal frontocentral region.    Hyperventilation and photic stimulation were not performed.   IMPRESSION: This study is within normal limits. No seizures or epileptiform discharges were seen throughout the recording. Lora Havens   ECHOCARDIOGRAM COMPLETE  Result Date: 11/23/2021    ECHOCARDIOGRAM REPORT   Patient Name:   [name] [name] Date of Exam: 11/23/2021 Medical Rec #:  [id number]      Height:       59.0 in Accession #:    [id number]     Weight:       110.9 lb Date of Birth:  February 04, 1961      BSA:          1.435 m Patient Age:    22 years       BP:           117/80 mmHg Patient Gender: F              HR:           91 bpm. Exam Location:  Inpatient Procedure: 2D Echo, Cardiac Doppler and Color Doppler Indications:    R55 Syncope  History:        Patient has prior history of Echocardiogram examinations, most                 recent 01/13/2019. Cardiomyopathy, Previous Myocardial Infarction,                  Arrythmias:Tachycardia, Signs/Symptoms:Hypotension; Risk                 Factors:Diabetes.  Sonographer:    Glo Herring Referring Phys: [place]  1. Left ventricular ejection fraction, by estimation, is 55 to 60%. The left ventricle has normal function. The left ventricle has no regional wall motion abnormalities. Left ventricular diastolic parameters are consistent with Grade I diastolic dysfunction (impaired relaxation).  2. Right ventricular systolic function is normal. The right ventricular size is normal. There is normal pulmonary artery systolic pressure.  3. The mitral valve is normal in structure. Trivial mitral valve regurgitation. No evidence of mitral stenosis.  4. The aortic valve is tricuspid. Aortic valve regurgitation is not visualized. No aortic stenosis is present.  5. The inferior vena cava is normal in size with greater than 50% respiratory variability, suggesting right atrial pressure of 3 mmHg. FINDINGS  Left Ventricle: Left ventricular ejection fraction, by estimation, is 55 to 60%. The left ventricle  has normal function. The left ventricle has no regional wall motion abnormalities. The left ventricular internal cavity size was normal in size. There is  no left ventricular hypertrophy. Left ventricular diastolic parameters are consistent with Grade I diastolic dysfunction (impaired relaxation). Indeterminate filling pressures. Right Ventricle: The right ventricular size is normal. No increase in right ventricular wall thickness. Right ventricular systolic function is normal. There is normal pulmonary artery systolic pressure. The tricuspid regurgitant velocity is 1.06 m/s, and  with an assumed right atrial pressure of 3 mmHg, the estimated right ventricular systolic pressure is 7.5 mmHg. Left Atrium: Left atrial size was normal in size. Right Atrium: Right atrial size was normal in size. Pericardium: There is no evidence of pericardial effusion. Mitral Valve: The  mitral valve is normal in structure. Trivial mitral valve regurgitation. No evidence of mitral valve stenosis. Tricuspid Valve: The tricuspid valve is normal in structure. Tricuspid valve regurgitation is trivial. No evidence of tricuspid stenosis. Aortic Valve: The aortic valve is tricuspid. Aortic valve regurgitation is not visualized. No aortic stenosis is present. Aortic valve mean gradient measures 3.0 mmHg. Aortic valve peak gradient measures 5.5 mmHg. Aortic valve area, by VTI measures 1.40 cm. Pulmonic Valve: The pulmonic valve was normal in structure. Pulmonic valve regurgitation is not visualized. No evidence of pulmonic stenosis. Aorta: The aortic root is normal in size and structure. Venous: The inferior vena cava is normal in size with greater than 50% respiratory variability, suggesting right atrial pressure of 3 mmHg. IAS/Shunts: No atrial level shunt detected by color flow Doppler.  LEFT VENTRICLE PLAX 2D LVIDd:         3.90 cm     Diastology LVIDs:         2.80 cm     LV e' medial:    6.53 cm/s LV PW:         0.80 cm     LV E/e' medial:  10.6 LV IVS:        0.80 cm     LV e' lateral:   6.64 cm/s LVOT diam:     1.80 cm     LV E/e' lateral: 10.4 LV SV:         37 LV SV Index:   26 LVOT Area:     2.54 cm  LV Volumes (MOD) LV vol d, MOD A2C: 47.7 ml LV vol d, MOD A4C: 52.4 ml LV vol s, MOD A2C: 21.4 ml LV vol s, MOD A4C: 21.7 ml LV SV MOD A2C:     26.3 ml LV SV MOD A4C:     52.4 ml LV SV MOD BP:      28.0 ml RIGHT VENTRICLE             IVC RV Basal diam:  2.50 cm     IVC diam: 1.80 cm RV S prime:     11.20 cm/s LEFT ATRIUM             Index        RIGHT ATRIUM          Index LA diam:        2.60 cm 1.81 cm/m   RA Area:     6.95 cm LA Vol (A2C):   33.4 ml 23.27 ml/m  RA Volume:   10.60 ml 7.38 ml/m LA Vol (A4C):   24.8 ml 17.28 ml/m LA Biplane Vol: 31.1 ml 21.67 ml/m  AORTIC VALVE  PULMONIC VALVE AV Area (Vmax):    1.59 cm     PV Vmax:       0.80 m/s AV Area (Vmean):   1.55 cm      PV Peak grad:  2.6 mmHg AV Area (VTI):     1.40 cm AV Vmax:           117.00 cm/s AV Vmean:          85.300 cm/s AV VTI:            0.264 m AV Peak Grad:      5.5 mmHg AV Mean Grad:      3.0 mmHg LVOT Vmax:         73.10 cm/s LVOT Vmean:        51.800 cm/s LVOT VTI:          0.145 m LVOT/AV VTI ratio: 0.55  AORTA Ao Root diam: 2.80 cm Ao Asc diam:  2.80 cm MITRAL VALVE               TRICUSPID VALVE MV Area (PHT): 7.02 cm    TR Peak grad:   4.5 mmHg MV Decel Time: 108 msec    TR Vmax:        106.00 cm/s MV E velocity: 69.00 cm/s MV A velocity: 79.30 cm/s  SHUNTS MV E/A ratio:  0.87        Systemic VTI:  0.14 m                            Systemic Diam: 1.80 cm Skeet Latch MD Electronically signed by Skeet Latch MD Signature Date/Time: 11/23/2021/11:33:51 AM    Final      PERTINENT LAB RESULTS: CBC: Recent Labs    11/22/21 0236 11/22/21 0336 11/23/21 0440  WBC 14.0*  --  13.0*  HGB 17.0* 17.3* 14.7  HCT 49.0* 51.0* 42.5  PLT 408*  --  327   CMET CMP     Component Value Date/Time   NA 137 11/23/2021 0440   NA 138 09/04/2021 1031   K 4.5 11/23/2021 0440   CL 102 11/23/2021 0440   CO2 25 11/23/2021 0440   GLUCOSE 102 (H) 11/23/2021 0440   BUN 17 11/23/2021 0440   BUN 11 09/04/2021 1031   CREATININE 0.70 11/23/2021 0440   CREATININE 0.66 12/20/2017 1750   CALCIUM 8.6 (L) 11/23/2021 0440   PROT 7.8 11/22/2021 0236   ALBUMIN 4.7 11/22/2021 0236   AST 23 11/22/2021 0236   ALT 14 11/22/2021 0236   ALKPHOS 67 11/22/2021 0236   BILITOT 1.1 11/22/2021 0236   GFRNONAA >60 11/23/2021 0440   GFRAA >60 08/19/2020 0514    GFR Estimated Creatinine Clearance: 52.3 mL/min (by C-G formula based on SCr of 0.7 mg/dL). Recent Labs    11/22/21 0236  LIPASE 24   No results for input(s): CKTOTAL, CKMB, CKMBINDEX, TROPONINI in the last 72 hours. Invalid input(s): POCBNP No results for input(s): DDIMER in the last 72 hours. Recent Labs    11/22/21 1918  HGBA1C 10.8*   No  results for input(s): CHOL, HDL, LDLCALC, TRIG, CHOLHDL, LDLDIRECT in the last 72 hours. No results for input(s): TSH, T4TOTAL, T3FREE, THYROIDAB in the last 72 hours.  Invalid input(s): FREET3 No results for input(s): VITAMINB12, FOLATE, FERRITIN, TIBC, IRON, RETICCTPCT in the last 72 hours. Coags: No results for input(s): INR in the last 72 hours.  Invalid input(s): PT Microbiology: Recent Results (  from the past 240 hour(s))  Resp Panel by RT-PCR (Flu A&B, Covid) Nasopharyngeal Swab     Status: None   Collection Time: 11/22/21 10:08 AM   Specimen: Nasopharyngeal Swab; Nasopharyngeal(NP) swabs in vial transport medium  Result Value Ref Range Status   SARS Coronavirus 2 by RT PCR NEGATIVE NEGATIVE Final    Comment: (NOTE) SARS-CoV-2 target nucleic acids are NOT DETECTED.  The SARS-CoV-2 RNA is generally detectable in upper respiratory specimens during the acute phase of infection. The lowest concentration of SARS-CoV-2 viral copies this assay can detect is 138 copies/mL. A negative result does not preclude SARS-Cov-2 infection and should not be used as the sole basis for treatment or other patient management decisions. A negative result may occur with  improper specimen collection/handling, submission of specimen other than nasopharyngeal swab, presence of viral mutation(s) within the areas targeted by this assay, and inadequate number of viral copies(<138 copies/mL). A negative result must be combined with clinical observations, patient history, and epidemiological information. The expected result is Negative.  Fact Sheet for Patients:  EntrepreneurPulse.com.au  Fact Sheet for Healthcare Providers:  IncredibleEmployment.be  This test is no t yet approved or cleared by the [place] FDA and  has been authorized for detection and/or diagnosis of SARS-CoV-2 by FDA under an Emergency Use Authorization (EUA). This EUA will remain  in effect  (meaning this test can be used) for the duration of the COVID-19 declaration under Section 564(b)(1) of the Act, 21 U.S.C.section 360bbb-3(b)(1), unless the authorization is terminated  or revoked sooner.       Influenza A by PCR NEGATIVE NEGATIVE Final   Influenza B by PCR NEGATIVE NEGATIVE Final    Comment: (NOTE) The Xpert Xpress SARS-CoV-2/FLU/RSV plus assay is intended as an aid in the diagnosis of influenza from Nasopharyngeal swab specimens and should not be used as a sole basis for treatment. Nasal washings and aspirates are unacceptable for Xpert Xpress SARS-CoV-2/FLU/RSV testing.  Fact Sheet for Patients: EntrepreneurPulse.com.au  Fact Sheet for Healthcare Providers: IncredibleEmployment.be  This test is not yet approved or cleared by the [place] FDA and has been authorized for detection and/or diagnosis of SARS-CoV-2 by FDA under an Emergency Use Authorization (EUA). This EUA will remain in effect (meaning this test can be used) for the duration of the COVID-19 declaration under Section 564(b)(1) of the Act, 21 U.S.C. section 360bbb-3(b)(1), unless the authorization is terminated or revoked.  Performed at [facility], [place] [address] [place], [place] [address]     FURTHER DISCHARGE INSTRUCTIONS:  Get Medicines reviewed and adjusted: Please take all your medications with you for your next visit with your Primary MD  Laboratory/radiological data: Please request your Primary MD to go over all hospital tests and procedure/radiological results at the follow up, please ask your Primary MD to get all Hospital records sent to his/her office.  In some cases, they will be blood work, cultures and biopsy results pending at the time of your discharge. Please request that your primary care M.D. goes through all the records of your hospital data and follows up on these results.  Also Note the following: If you experience  worsening of your admission symptoms, develop shortness of breath, life threatening emergency, suicidal or homicidal thoughts you must seek medical attention immediately by calling 911 or calling your MD immediately  if symptoms less severe.  You must read complete instructions/literature along with all the possible adverse reactions/side effects for all the Medicines you take and that  have been prescribed to you. Take any new Medicines after you have completely understood and accpet all the possible adverse reactions/side effects.   Do not drive when taking Pain medications or sleeping medications (Benzodaizepines)  Do not take more than prescribed Pain, Sleep and Anxiety Medications. It is not advisable to combine anxiety,sleep and pain medications without talking with your primary care practitioner  Special Instructions: If you have smoked or chewed Tobacco  in the last 2 yrs please stop smoking, stop any regular Alcohol  and or any Recreational drug use.  Wear Seat belts while driving.  Please note: You were cared for by a hospitalist during your hospital stay. Once you are discharged, your primary care physician will handle any further medical issues. Please note that NO REFILLS for any discharge medications will be authorized once you are discharged, as it is imperative that you return to your primary care physician (or establish a relationship with a primary care physician if you do not have one) for your post hospital discharge needs so that they can reassess your need for medications and monitor your lab values.  Total Time spent coordinating discharge including counseling, education and face to face time equals 35 minutes.  Signed: Adelynne Joerger 11/24/2021 11:22 AM

## 2021-11-24 NOTE — Progress Notes (Signed)
PT discharged home with spouse in stable condition. Discharge instructions given. Pt verbalized understanding. No immediate questions or concerns at this time. Discharged via wheelchair.

## 2021-11-24 NOTE — TOC Transition Note (Signed)
Transition of Care [facility]) - CM/SW Discharge Note   Patient Details  Name: [name] [name] MRN: [id number] Date of Birth: 1961-01-17  Transition of Care [facility]) CM/SW Contact:  Tom-Johnson, Renea Ee, RN Phone Number: 11/24/2021, 11:47 AM   Clinical Narrative:     Transition of Care (TOC) Screening Note   Patient Details  Name: [name] [name] Date of Birth: 09-May-1961   Transition of Care [facility]) CM/SW Contact:    Tom-Johnson, Renea Ee, RN Phone Number: 11/24/2021, 11:47 AM  Patient is scheduled for discharge today. Admitted and treated for Orthostatic Hypotension. Patient lives with husband and her two adult children. Transition of Care Department [facility]) has reviewed patient and no TOC needs have been identified at this time. Husband to transport at discharge.  Final next level of care: Home/Self Care Barriers to Discharge: Barriers Resolved   Patient Goals and CMS Choice Patient states their goals for this hospitalization and ongoing recovery are:: To go home CMS Medicare.gov Compare Post Acute Care list provided to:: Patient Choice offered to / list presented to : NA  Discharge Placement                       Discharge Plan and Services                DME Arranged: N/A DME Agency: NA       HH Arranged: NA HH Agency: NA        Social Determinants of Health (SDOH) Interventions     Readmission Risk Interventions Readmission Risk Prevention Plan 06/01/2019  Transportation Screening Complete  PCP or Specialist Appt within 3-5 Days Not Complete  Not Complete comments not ready for d/c  HRI or [place] Complete  Social Work Consult for [place] Planning/Counseling Complete  Palliative Care Screening Not Applicable  Medication Review [employer]) Complete  Some recent data might be hidden

## 2021-11-24 NOTE — Plan of Care (Signed)
Problem: Education: °Goal: Knowledge of General Education information will improve °Description: Including pain rating scale, medication(s)/side effects and non-pharmacologic comfort measures °Outcome: Completed/Met °  °Problem: Health Behavior/Discharge Planning: °Goal: Ability to manage health-related needs will improve °Outcome: Completed/Met °  °Problem: Clinical Measurements: °Goal: Ability to maintain clinical measurements within normal limits will improve °Outcome: Completed/Met °Goal: Will remain free from infection °Outcome: Completed/Met °Goal: Diagnostic test results will improve °Outcome: Completed/Met °Goal: Respiratory complications will improve °Outcome: Completed/Met °Goal: Cardiovascular complication will be avoided °Outcome: Completed/Met °  °Problem: Activity: °Goal: Risk for activity intolerance will decrease °Outcome: Completed/Met °  °Problem: Nutrition: °Goal: Adequate nutrition will be maintained °Outcome: Completed/Met °  °Problem: Coping: °Goal: Level of anxiety will decrease °Outcome: Completed/Met °  °Problem: Elimination: °Goal: Will not experience complications related to bowel motility °Outcome: Completed/Met °Goal: Will not experience complications related to urinary retention °Outcome: Completed/Met °  °Problem: Pain Managment: °Goal: General experience of comfort will improve °Outcome: Completed/Met °  °Problem: Safety: °Goal: Ability to remain free from injury will improve °Outcome: Completed/Met °  °Problem: Skin Integrity: °Goal: Risk for impaired skin integrity will decrease °Outcome: Completed/Met °  °

## 2022-01-01 LAB — HM DIABETES FOOT EXAM

## 2022-01-05 ENCOUNTER — Other Ambulatory Visit: Payer: Self-pay

## 2022-01-05 ENCOUNTER — Ambulatory Visit (INDEPENDENT_AMBULATORY_CARE_PROVIDER_SITE_OTHER): Payer: BC Managed Care – PPO | Admitting: Family Medicine

## 2022-01-05 VITALS — BP 100/60 | HR 90 | Temp 98.1°F | Ht 59.75 in | Wt 113.0 lb

## 2022-01-05 DIAGNOSIS — Z0000 Encounter for general adult medical examination without abnormal findings: Secondary | ICD-10-CM

## 2022-01-05 DIAGNOSIS — I951 Orthostatic hypotension: Secondary | ICD-10-CM

## 2022-01-05 DIAGNOSIS — E039 Hypothyroidism, unspecified: Secondary | ICD-10-CM | POA: Diagnosis not present

## 2022-01-05 DIAGNOSIS — E538 Deficiency of other specified B group vitamins: Secondary | ICD-10-CM | POA: Diagnosis not present

## 2022-01-05 DIAGNOSIS — I959 Hypotension, unspecified: Secondary | ICD-10-CM

## 2022-01-05 DIAGNOSIS — Z23 Encounter for immunization: Secondary | ICD-10-CM | POA: Diagnosis not present

## 2022-01-05 DIAGNOSIS — E1149 Type 2 diabetes mellitus with other diabetic neurological complication: Secondary | ICD-10-CM

## 2022-01-05 DIAGNOSIS — E559 Vitamin D deficiency, unspecified: Secondary | ICD-10-CM

## 2022-01-05 DIAGNOSIS — E785 Hyperlipidemia, unspecified: Secondary | ICD-10-CM | POA: Diagnosis not present

## 2022-01-05 DIAGNOSIS — S99921A Unspecified injury of right foot, initial encounter: Secondary | ICD-10-CM | POA: Insufficient documentation

## 2022-01-05 DIAGNOSIS — R55 Syncope and collapse: Secondary | ICD-10-CM

## 2022-01-05 DIAGNOSIS — I5022 Chronic systolic (congestive) heart failure: Secondary | ICD-10-CM

## 2022-01-05 MED ORDER — PROMETHAZINE HCL 25 MG PO TABS: 25.0000 mg | ORAL_TABLET | Freq: Three times a day (TID) | ORAL | 3 refills | Status: DC | PRN

## 2022-01-05 NOTE — Assessment & Plan Note (Signed)
Due for re-eval. 

## 2022-01-05 NOTE — Assessment & Plan Note (Signed)
She is off metoprolol and is using midodrine.Marland Kitchen once daily as the three times daily gives her SEs.  She has follow up soon with cardiology.

## 2022-01-05 NOTE — Assessment & Plan Note (Signed)
Due for re-eval.  No longer on statin or zetia.

## 2022-01-05 NOTE — Assessment & Plan Note (Addendum)
Chronic, improving but inadequately controlled. Having  Intermittent low blood sugars.Encouraged her to return to  ENDO given brittle DM.Marland Kitchen she is agreeable and will call.

## 2022-01-05 NOTE — Assessment & Plan Note (Signed)
Abrasion.. minimal redness, no heat. She is high risk for infection. Treat with topical antibiotics and keep clean and covered. Reviewed return precautions.. she will call if redness increasing, fever, pain or heat in toe for consideration of antibiotics.

## 2022-01-05 NOTE — Assessment & Plan Note (Signed)
Due to hypotension.. no recent episodes.

## 2022-01-05 NOTE — Assessment & Plan Note (Signed)
Euvolemic in office today 

## 2022-01-05 NOTE — Progress Notes (Signed)
Patient ID: [name] [name], female    DOB: 04-20-1961, 61 y.o.   MRN: [id number]  This visit was conducted in person.  BP 100/60    Pulse 90    Temp 98.1 F (36.7 C) (Temporal)    Ht 4' 11.75" (1.518 m)    Wt 113 lb (51.3 kg)    LMP 08/11/2012    SpO2 99%    BMI 22.25 kg/m    CC:  Chief Complaint  Patient presents with   Annual Exam    Eye appt scheduled for 03/14/22   Toe Injury    R big toe x 1 week     Subjective:   HPI: [name] [name] is a 61 y.o. female presenting on 01/05/2022 for Annual Exam (Eye appt scheduled for 03/14/22) and Toe Injury (R big toe x 1 week )   Right great toe injury:  1 week ago stubbed toe onright foot. Dragged toe on ground.  No heat, dry skin and redness.   DM followed by ENDO in past ( reviewed last OV 2021) ...most recent A1C improving overtime at 10.8 down from 15 in 2021.  She has been having intermittent lows on this current regimen.  Was in ED 12/26 for hyperglycemia.   Has had recent syncopal spells from hypotension/orthostatic hypotension She is no longer on BBlockcer.  She is using midodrine once daily.Marland Kitchen.  ( was supposed to take TID)because more makes her nauseous. No further syncope. BP Readings from Last 3 Encounters:  01/05/22 100/60  11/24/21 130/88  09/13/21 (!) 148/102    Hypothyroid: Due for re-eval.   Relevant past medical, surgical, family and social history reviewed and updated as indicated. Interim medical history since our last visit reviewed. Allergies and medications reviewed and updated. Outpatient Medications Prior to Visit  Medication Sig Dispense Refill   Continuous Blood Gluc Sensor (DEXCOM G6 SENSOR) MISC INJECT 1 SENSOR INTO SKIN EVERY 10 DAYS 3 each 11   Continuous Blood Gluc Transmit (DEXCOM G6 TRANSMITTER) MISC USE AS DIRECTED FOR CONTINUOUS GLUCOSE MONITORING. REUSE TRANSMITTER X 90DAYS THEN DISCARD & REPLACE 1 each 3   cyclobenzaprine (FLEXERIL) 10 MG tablet Take 10 mg by mouth at bedtime.     dicyclomine  (BENTYL) 10 MG capsule TAKE 1 CAPSULE BY MOUTH TWICE A DAY AS NEEDED FOR SPASMS (Patient taking differently: Take 10 mg by mouth 2 (two) times daily as needed for spasms.) 60 capsule 2   gabapentin (NEURONTIN) 300 MG capsule Take 300 mg by mouth 3 (three) times daily as needed (pain).     insulin lispro (HUMALOG KWIKPEN) 100 UNIT/ML KwikPen Inject into skin 3 times a day at meal time according to the following sliding scale:  27 units max per day Dx E11.43 (Patient taking differently: Inject 1-7 Units into the skin with breakfast, with lunch, and with evening meal. Per sliding scale) 15 mL 11   Insulin Pen Needle (PEN NEEDLES) 31G X 8 MM MISC Use to inject insulin 4 times a day.  Dx: E11.10 130 each 11   Lancets (ONETOUCH ULTRASOFT) lancets Use to check blood sugar daily as needed.  Dx: E11.10 100 each 3   levothyroxine (SYNTHROID) 175 MCG tablet Take 175 mcg by mouth daily.     metoCLOPramide (REGLAN) 5 MG tablet Take 5mg  before breakfast lunch and dinner and 10mg  at bedtime 150 tablet 11   midodrine (PROAMATINE) 2.5 MG tablet Take 1 tablet at 7 AM, 12 NOON and 4PM (Patient taking differently: Take 2.5  mg by mouth in the morning, at noon, and at bedtime.) 90 tablet 5   naproxen (NAPROSYN) 375 MG tablet Take 1 tablet (375 mg total) by mouth 2 (two) times daily. (Patient taking differently: Take 375 mg by mouth 2 (two) times daily as needed for mild pain.) 20 tablet 0   pantoprazole (PROTONIX) 20 MG tablet Take 1 tablet (20 mg total) by mouth 2 (two) times daily. 60 tablet 0   promethazine (PHENERGAN) 25 MG tablet TAKE 1 TABLET BY MOUTH EVERY 8 HOUR AS NEEDED FOR NAUSEA AND VOMITING. (Patient taking differently: Take 25 mg by mouth every 8 (eight) hours as needed for nausea or vomiting.) 30 tablet 1   TOUJEO SOLOSTAR 300 UNIT/ML Solostar Pen Inject 18 Units into the skin at bedtime. 6 mL 3   metoprolol succinate (TOPROL-XL) 25 MG 24 hr tablet TAKE 1 TABLET (25 MG TOTAL) BY MOUTH DAILY. (Patient not  taking: Reported on 01/05/2022) 90 tablet 0   No facility-administered medications prior to visit.     Per HPI unless specifically indicated in ROS section below Review of Systems  Constitutional:  Positive for fatigue. Negative for fever.  HENT:  Negative for congestion.   Eyes:  Negative for pain.  Respiratory:  Negative for cough and shortness of breath.   Cardiovascular:  Negative for chest pain, palpitations and leg swelling.  Gastrointestinal:  Negative for abdominal pain.  Genitourinary:  Negative for dysuria and vaginal bleeding.  Musculoskeletal:  Negative for back pain.  Neurological:  Negative for syncope, light-headedness and headaches.  Psychiatric/Behavioral:  Negative for dysphoric mood.   Objective:  BP 100/60    Pulse 90    Temp 98.1 F (36.7 C) (Temporal)    Ht 4' 11.75" (1.518 m)    Wt 113 lb (51.3 kg)    LMP 08/11/2012    SpO2 99%    BMI 22.25 kg/m   Wt Readings from Last 3 Encounters:  01/05/22 113 lb (51.3 kg)  11/24/21 108 lb 0.4 oz (49 kg)  09/04/21 103 lb 3.2 oz (46.8 kg)      Physical Exam Constitutional:      General: She is not in acute distress.    Appearance: Normal appearance. She is well-developed. She is not ill-appearing or toxic-appearing.  HENT:     Head: Normocephalic.     Right Ear: Hearing, tympanic membrane, ear canal and external ear normal. Tympanic membrane is not erythematous, retracted or bulging.     Left Ear: Hearing, tympanic membrane, ear canal and external ear normal. Tympanic membrane is not erythematous, retracted or bulging.     Nose: No mucosal edema or rhinorrhea.     Right Sinus: No maxillary sinus tenderness or frontal sinus tenderness.     Left Sinus: No maxillary sinus tenderness or frontal sinus tenderness.     Mouth/Throat:     Pharynx: Uvula midline.  Eyes:     General: Lids are normal. Lids are everted, no foreign bodies appreciated.     Conjunctiva/sclera: Conjunctivae normal.     Pupils: Pupils are equal,  round, and reactive to light.  Neck:     Thyroid: No thyroid mass or thyromegaly.     Vascular: No carotid bruit.     Trachea: Trachea normal.  Cardiovascular:     Rate and Rhythm: Normal rate and regular rhythm.     Pulses: Normal pulses.     Heart sounds: Normal heart sounds, S1 normal and S2 normal. No murmur heard.   No friction  rub. No gallop.  Pulmonary:     Effort: Pulmonary effort is normal. No tachypnea or respiratory distress.     Breath sounds: Normal breath sounds. No decreased breath sounds, wheezing, rhonchi or rales.  Abdominal:     General: Bowel sounds are normal.     Palpations: Abdomen is soft.     Tenderness: There is no abdominal tenderness.  Musculoskeletal:     Cervical back: Normal range of motion and neck supple.  Skin:    General: Skin is warm and dry.     Findings: No rash.  Neurological:     Mental Status: She is alert.  Psychiatric:        Mood and Affect: Mood is not anxious or depressed.        Speech: Speech normal.        Behavior: Behavior normal. Behavior is cooperative.        Thought Content: Thought content normal.        Judgment: Judgment normal.      Diabetic foot exam: Normal inspection No skin breakdown No calluses  Normal DP pulses Normal sensation to light touch and monofilament Nails normal  Results for orders placed or performed during the hospital encounter of 11/22/21  Resp Panel by RT-PCR (Flu A&B, Covid) Nasopharyngeal Swab   Specimen: Nasopharyngeal Swab; Nasopharyngeal(NP) swabs in vial transport medium  Result Value Ref Range   SARS Coronavirus 2 by RT PCR NEGATIVE NEGATIVE   Influenza A by PCR NEGATIVE NEGATIVE   Influenza B by PCR NEGATIVE NEGATIVE  CBC with Differential  Result Value Ref Range   WBC 14.0 (H) 4.0 - 10.5 K/uL   RBC 5.65 (H) 3.87 - 5.11 MIL/uL   Hemoglobin 17.0 (H) 12.0 - 15.0 g/dL   HCT 49.0 (H) 36.0 - 46.0 %   MCV 86.7 80.0 - 100.0 fL   MCH 30.1 26.0 - 34.0 pg   MCHC 34.7 30.0 - 36.0 g/dL    RDW 14.1 11.5 - 15.5 %   Platelets 408 (H) 150 - 400 K/uL   nRBC 0.0 0.0 - 0.2 %   Neutrophils Relative % 73 %   Neutro Abs 10.3 (H) 1.7 - 7.7 K/uL   Lymphocytes Relative 16 %   Lymphs Abs 2.2 0.7 - 4.0 K/uL   Monocytes Relative 10 %   Monocytes Absolute 1.3 (H) 0.1 - 1.0 K/uL   Eosinophils Relative 0 %   Eosinophils Absolute 0.0 0.0 - 0.5 K/uL   Basophils Relative 0 %   Basophils Absolute 0.0 0.0 - 0.1 K/uL   Immature Granulocytes 1 %   Abs Immature Granulocytes 0.08 (H) 0.00 - 0.07 K/uL  Comprehensive metabolic panel  Result Value Ref Range   Sodium 130 (L) 135 - 145 mmol/L   Potassium 2.8 (L) 3.5 - 5.1 mmol/L   Chloride 82 (L) 98 - 111 mmol/L   CO2 28 22 - 32 mmol/L   Glucose, Bld 438 (H) 70 - 99 mg/dL   BUN 17 6 - 20 mg/dL   Creatinine, Ser 1.08 (H) 0.44 - 1.00 mg/dL   Calcium 9.7 8.9 - 10.3 mg/dL   Total Protein 7.8 6.5 - 8.1 g/dL   Albumin 4.7 3.5 - 5.0 g/dL   AST 23 15 - 41 U/L   ALT 14 0 - 44 U/L   Alkaline Phosphatase 67 38 - 126 U/L   Total Bilirubin 1.1 0.3 - 1.2 mg/dL   GFR, Estimated 60 (L) >60 mL/min   Anion gap 20 (H)  5 - 15  Lipase, blood  Result Value Ref Range   Lipase 24 11 - 51 U/L  Hemoglobin A1c  Result Value Ref Range   Hgb A1c MFr Bld 10.8 (H) 4.8 - 5.6 %   Mean Plasma Glucose 263.26 mg/dL  Basic metabolic panel  Result Value Ref Range   Sodium 134 (L) 135 - 145 mmol/L   Potassium 3.0 (L) 3.5 - 5.1 mmol/L   Chloride 97 (L) 98 - 111 mmol/L   CO2 23 22 - 32 mmol/L   Glucose, Bld 68 (L) 70 - 99 mg/dL   BUN 21 (H) 6 - 20 mg/dL   Creatinine, Ser 0.67 0.44 - 1.00 mg/dL   Calcium 8.8 (L) 8.9 - 10.3 mg/dL   GFR, Estimated >60 >60 mL/min   Anion gap 14 5 - 15  Magnesium  Result Value Ref Range   Magnesium 2.0 1.7 - 2.4 mg/dL  Basic metabolic panel  Result Value Ref Range   Sodium 137 135 - 145 mmol/L   Potassium 4.5 3.5 - 5.1 mmol/L   Chloride 102 98 - 111 mmol/L   CO2 25 22 - 32 mmol/L   Glucose, Bld 102 (H) 70 - 99 mg/dL   BUN 17 6 -  20 mg/dL   Creatinine, Ser 0.70 0.44 - 1.00 mg/dL   Calcium 8.6 (L) 8.9 - 10.3 mg/dL   GFR, Estimated >60 >60 mL/min   Anion gap 10 5 - 15  CBC  Result Value Ref Range   WBC 13.0 (H) 4.0 - 10.5 K/uL   RBC 4.90 3.87 - 5.11 MIL/uL   Hemoglobin 14.7 12.0 - 15.0 g/dL   HCT 42.5 36.0 - 46.0 %   MCV 86.7 80.0 - 100.0 fL   MCH 30.0 26.0 - 34.0 pg   MCHC 34.6 30.0 - 36.0 g/dL   RDW 14.1 11.5 - 15.5 %   Platelets 327 150 - 400 K/uL   nRBC 0.0 0.0 - 0.2 %  Glucose, capillary  Result Value Ref Range   Glucose-Capillary 141 (H) 70 - 99 mg/dL  Glucose, capillary  Result Value Ref Range   Glucose-Capillary 88 70 - 99 mg/dL  Glucose, capillary  Result Value Ref Range   Glucose-Capillary 102 (H) 70 - 99 mg/dL  Glucose, capillary  Result Value Ref Range   Glucose-Capillary 94 70 - 99 mg/dL  Glucose, capillary  Result Value Ref Range   Glucose-Capillary 116 (H) 70 - 99 mg/dL  Glucose, capillary  Result Value Ref Range   Glucose-Capillary 105 (H) 70 - 99 mg/dL  Glucose, capillary  Result Value Ref Range   Glucose-Capillary 156 (H) 70 - 99 mg/dL  POC CBG, ED  Result Value Ref Range   Glucose-Capillary 341 (H) 70 - 99 mg/dL  I-Stat venous blood gas, [facility] ED)  Result Value Ref Range   pH, Ven 7.445 (H) 7.250 - 7.430   pCO2, Ven 49.1 44.0 - 60.0 mmHg   pO2, Ven 35.0 32.0 - 45.0 mmHg   Bicarbonate 33.8 (H) 20.0 - 28.0 mmol/L   TCO2 35 (H) 22 - 32 mmol/L   O2 Saturation 69.0 %   Acid-Base Excess 8.0 (H) 0.0 - 2.0 mmol/L   Sodium 132 (L) 135 - 145 mmol/L   Potassium 2.8 (L) 3.5 - 5.1 mmol/L   Calcium, Ion 1.02 (L) 1.15 - 1.40 mmol/L   HCT 51.0 (H) 36.0 - 46.0 %   Hemoglobin 17.3 (H) 12.0 - 15.0 g/dL   Sample type VENOUS  Comment NOTIFIED PHYSICIAN   CBG monitoring, ED  Result Value Ref Range   Glucose-Capillary 338 (H) 70 - 99 mg/dL  CBG monitoring, ED  Result Value Ref Range   Glucose-Capillary 320 (H) 70 - 99 mg/dL  CBG monitoring, ED  Result Value Ref Range    Glucose-Capillary 235 (H) 70 - 99 mg/dL  CBG monitoring, ED  Result Value Ref Range   Glucose-Capillary 84 70 - 99 mg/dL  ECHOCARDIOGRAM COMPLETE  Result Value Ref Range   Weight 1,774.26 oz   Height 59 in   BP 117/80 mmHg   Single Plane A2C EF 55.1 %   Single Plane A4C EF 58.6 %   Calc EF 55.6 %   S' Lateral 2.80 cm   AR max vel 1.59 cm2   AV Area VTI 1.40 cm2   AV Mean grad 3.0 mmHg   AV Peak grad 5.5 mmHg   Ao pk vel 1.17 m/s   Area-P 1/2 7.02 cm2   AV Area mean vel 1.55 cm2    This visit occurred during the SARS-CoV-2 public health emergency.  Safety protocols were in place, including screening questions prior to the visit, additional usage of staff PPE, and extensive cleaning of exam room while observing appropriate contact time as indicated for disinfecting solutions.   COVID 19 screen:  No recent travel or known exposure to COVID19 The patient denies respiratory symptoms of COVID 19 at this time. The importance of social distancing was discussed today.   Assessment and Plan The patient's preventative maintenance and recommended screening tests for an annual wellness exam were reviewed in full today. Brought up to date unless services declined.  Counselled on the importance of diet, exercise, and its role in overall health and mortality. The patient's FH and SH was reviewed, including their home life, tobacco status, and drug and alcohol status.   Given flu, consider shingrix  Due for mammogram Encouraged yearly eye exam. Colonoscopy 05/25/2019, repeat in030 Hep C neg S/P total hysterectomy  Problem List Items Addressed This Visit     B12 deficiency (Chronic)    Due for re-eval.       Relevant Orders   Vitamin X83   Chronic systolic CHF (congestive heart failure) ([place]) (Chronic)    Euvolemic in office today.      Hyperlipidemia (Chronic)    Due for re-eval.  No longer on statin or zetia.      Hypothyroidism (Chronic)    Due for re-eval.       Relevant Orders   TSH   T4, free   T3, free   Orthostatic hypotension (Chronic)    She is off metoprolol and is using midodrine.Marland Kitchen once daily as the three times daily gives her SEs.  She has follow up soon with cardiology.      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 Having  Intermittent low blood sugars.Encouraged her to return to  ENDO given brittle DM.Marland Kitchen she is agreeable and will call.      Relevant Orders   Lipid panel   Comprehensive metabolic panel   Microalbumin / creatinine urine ratio   Vitamin D deficiency (Chronic)    Due for re-eval.      Relevant Orders   VITAMIN D 25 Hydroxy (Vit-D Deficiency, Fractures)   Injury of toe on right foot    Abrasion.. minimal redness, no heat. She is high risk for infection. Treat with topical antibiotics and keep  clean and covered. Reviewed return precautions.. she will call if redness increasing, fever, pain or heat in toe for consideration of antibiotics.      Routine general medical examination at a health care facility - Primary   Syncope    Due to hypotension.. no recent episodes.      Other Visit Diagnoses     Need for influenza vaccination       Relevant Orders   Flu Vaccine QUAD 69mo+IM (Fluarix, Fluzone & Alfiuria Quad PF) (Completed)   Hypotension, unspecified hypotension type          Meds ordered this encounter  Medications   promethazine (PHENERGAN) 25 MG tablet    Sig: Take 1 tablet (25 mg total) by mouth every 8 (eight) hours as needed for nausea or vomiting.    Dispense:  30 tablet    Refill:  3        Eliezer Lofts, MD

## 2022-01-05 NOTE — Patient Instructions (Addendum)
Set up yearly eye exam for diabetes and have the opthalmologist send [place] a copy of the evaluation for the chart. Call to set up mammogram appt.  Consider Shingrix vaccine.  Please stop at the lab to have labs drawn.   Call to get back in with ENDO: [address], [address] [place], [place] [address]   [phone] (Work)   [phone] (Fax)

## 2022-01-06 LAB — T4, FREE: Free T4: 0.9 ng/dL (ref 0.8–1.8)

## 2022-01-06 LAB — COMPREHENSIVE METABOLIC PANEL
AG Ratio: 1.8 (calc) (ref 1.0–2.5)
ALT: 7 U/L (ref 6–29)
AST: 9 U/L — ABNORMAL LOW (ref 10–35)
Albumin: 4.3 g/dL (ref 3.6–5.1)
Alkaline phosphatase (APISO): 67 U/L (ref 37–153)
BUN: 12 mg/dL (ref 7–25)
CO2: 30 mmol/L (ref 20–32)
Calcium: 9.7 mg/dL (ref 8.6–10.4)
Chloride: 97 mmol/L — ABNORMAL LOW (ref 98–110)
Creat: 0.64 mg/dL (ref 0.50–1.03)
Globulin: 2.4 g/dL (calc) (ref 1.9–3.7)
Glucose, Bld: 221 mg/dL — ABNORMAL HIGH (ref 65–99)
Potassium: 4.2 mmol/L (ref 3.5–5.3)
Sodium: 135 mmol/L (ref 135–146)
Total Bilirubin: 0.4 mg/dL (ref 0.2–1.2)
Total Protein: 6.7 g/dL (ref 6.1–8.1)

## 2022-01-06 LAB — VITAMIN D 25 HYDROXY (VIT D DEFICIENCY, FRACTURES): Vit D, 25-Hydroxy: 28 ng/mL — ABNORMAL LOW (ref 30–100)

## 2022-01-06 LAB — LIPID PANEL
Cholesterol: 269 mg/dL — ABNORMAL HIGH (ref <200)
HDL: 47 mg/dL — ABNORMAL LOW (ref >=50)
LDL Cholesterol (Calc): 186 mg/dL (calc) — ABNORMAL HIGH
Non-HDL Cholesterol (Calc): 222 mg/dL (calc) — ABNORMAL HIGH (ref <130)
Total CHOL/HDL Ratio: 5.7 (calc) — ABNORMAL HIGH (ref <5.0)
Triglycerides: 184 mg/dL — ABNORMAL HIGH (ref <150)

## 2022-01-06 LAB — TSH: TSH: 53.58 mIU/L — ABNORMAL HIGH (ref 0.40–4.50)

## 2022-01-06 LAB — T3, FREE: T3, Free: 2.8 pg/mL (ref 2.3–4.2)

## 2022-01-06 LAB — VITAMIN B12: Vitamin B-12: 379 pg/mL (ref 200–1100)

## 2022-01-12 ENCOUNTER — Encounter: Payer: Self-pay | Admitting: Family Medicine

## 2022-01-12 MED ORDER — VITAMIN D (ERGOCALCIFEROL) 1.25 MG (50000 UNIT) PO CAPS: 50000.0000 [IU] | ORAL_CAPSULE | ORAL | 0 refills | Status: DC

## 2022-01-12 MED ORDER — ATORVASTATIN CALCIUM 80 MG PO TABS: 80.0000 mg | ORAL_TABLET | Freq: Every day | ORAL | 3 refills | Status: DC

## 2022-01-14 ENCOUNTER — Other Ambulatory Visit: Payer: Self-pay | Admitting: Family Medicine

## 2022-01-15 ENCOUNTER — Other Ambulatory Visit: Payer: Self-pay | Admitting: Family Medicine

## 2022-01-16 ENCOUNTER — Other Ambulatory Visit: Payer: Self-pay | Admitting: Family Medicine

## 2022-01-17 ENCOUNTER — Emergency Department (HOSPITAL_COMMUNITY)
Admission: EM | Admit: 2022-01-17 | Discharge: 2022-01-19 | Disposition: A | Discharge status: Home / Self Care | Payer: BC Managed Care – PPO | Attending: Emergency Medicine | Admitting: Emergency Medicine

## 2022-01-17 ENCOUNTER — Emergency Department (HOSPITAL_COMMUNITY): Payer: BC Managed Care – PPO

## 2022-01-17 DIAGNOSIS — R1110 Vomiting, unspecified: Secondary | ICD-10-CM | POA: Diagnosis not present

## 2022-01-17 DIAGNOSIS — R000 Tachycardia, unspecified: Secondary | ICD-10-CM | POA: Diagnosis not present

## 2022-01-17 DIAGNOSIS — E876 Hypokalemia: Secondary | ICD-10-CM | POA: Diagnosis not present

## 2022-01-17 DIAGNOSIS — R739 Hyperglycemia, unspecified: Secondary | ICD-10-CM | POA: Diagnosis not present

## 2022-01-17 DIAGNOSIS — R197 Diarrhea, unspecified: Secondary | ICD-10-CM | POA: Diagnosis not present

## 2022-01-17 DIAGNOSIS — F32A Depression, unspecified: Secondary | ICD-10-CM

## 2022-01-17 DIAGNOSIS — R1013 Epigastric pain: Secondary | ICD-10-CM | POA: Insufficient documentation

## 2022-01-17 DIAGNOSIS — R109 Unspecified abdominal pain: Secondary | ICD-10-CM | POA: Diagnosis not present

## 2022-01-17 DIAGNOSIS — Z794 Long term (current) use of insulin: Secondary | ICD-10-CM | POA: Diagnosis not present

## 2022-01-17 DIAGNOSIS — Z79899 Other long term (current) drug therapy: Secondary | ICD-10-CM | POA: Diagnosis not present

## 2022-01-17 DIAGNOSIS — F331 Major depressive disorder, recurrent, moderate: Secondary | ICD-10-CM | POA: Diagnosis present

## 2022-01-17 DIAGNOSIS — R070 Pain in throat: Secondary | ICD-10-CM | POA: Diagnosis not present

## 2022-01-17 DIAGNOSIS — R112 Nausea with vomiting, unspecified: Secondary | ICD-10-CM | POA: Diagnosis not present

## 2022-01-17 DIAGNOSIS — E1165 Type 2 diabetes mellitus with hyperglycemia: Secondary | ICD-10-CM | POA: Diagnosis not present

## 2022-01-17 DIAGNOSIS — Z20822 Contact with and (suspected) exposure to covid-19: Secondary | ICD-10-CM | POA: Diagnosis not present

## 2022-01-17 DIAGNOSIS — R404 Transient alteration of awareness: Secondary | ICD-10-CM | POA: Diagnosis not present

## 2022-01-17 LAB — COMPREHENSIVE METABOLIC PANEL
ALT: 13 U/L (ref 0–44)
AST: 24 U/L (ref 15–41)
Albumin: 4.3 g/dL (ref 3.5–5.0)
Alkaline Phosphatase: 61 U/L (ref 38–126)
Anion gap: 16 — ABNORMAL HIGH (ref 5–15)
BUN: 21 mg/dL — ABNORMAL HIGH (ref 6–20)
CO2: 29 mmol/L (ref 22–32)
Calcium: 9.2 mg/dL (ref 8.9–10.3)
Chloride: 90 mmol/L — ABNORMAL LOW (ref 98–111)
Creatinine, Ser: 0.76 mg/dL (ref 0.44–1.00)
GFR, Estimated: >60 mL/min (ref >60)
Glucose, Bld: 241 mg/dL — ABNORMAL HIGH (ref 70–99)
Potassium: 3.5 mmol/L (ref 3.5–5.1)
Sodium: 135 mmol/L (ref 135–145)
Total Bilirubin: 0.6 mg/dL (ref 0.3–1.2)
Total Protein: 7.7 g/dL (ref 6.5–8.1)

## 2022-01-17 LAB — URINALYSIS, ROUTINE W REFLEX MICROSCOPIC
Bilirubin Urine: NEGATIVE
Glucose, UA: >=500 mg/dL — AB
Ketones, ur: 20 mg/dL — AB
Nitrite: NEGATIVE
Protein, ur: 100 mg/dL — AB
Specific Gravity, Urine: 1.023 (ref 1.005–1.030)
pH: 6 (ref 5.0–8.0)

## 2022-01-17 LAB — ACETAMINOPHEN LEVEL: Acetaminophen (Tylenol), Serum: <10 ug/mL — ABNORMAL LOW (ref 10–30)

## 2022-01-17 LAB — CBC WITH DIFFERENTIAL/PLATELET
Abs Immature Granulocytes: 0.11 10*3/uL — ABNORMAL HIGH (ref 0.00–0.07)
Basophils Absolute: 0 10*3/uL (ref 0.0–0.1)
Basophils Relative: 0 %
Eosinophils Absolute: 0 10*3/uL (ref 0.0–0.5)
Eosinophils Relative: 0 %
HCT: 43.7 % (ref 36.0–46.0)
Hemoglobin: 15.7 g/dL — ABNORMAL HIGH (ref 12.0–15.0)
Immature Granulocytes: 1 %
Lymphocytes Relative: 9 %
Lymphs Abs: 1.6 10*3/uL (ref 0.7–4.0)
MCH: 30.3 pg (ref 26.0–34.0)
MCHC: 35.9 g/dL (ref 30.0–36.0)
MCV: 84.2 fL (ref 80.0–100.0)
Monocytes Absolute: 1.3 10*3/uL — ABNORMAL HIGH (ref 0.1–1.0)
Monocytes Relative: 8 %
Neutro Abs: 13.8 10*3/uL — ABNORMAL HIGH (ref 1.7–7.7)
Neutrophils Relative %: 82 %
Platelets: 332 10*3/uL (ref 150–400)
RBC: 5.19 MIL/uL — ABNORMAL HIGH (ref 3.87–5.11)
RDW: 14.2 % (ref 11.5–15.5)
WBC: 16.8 10*3/uL — ABNORMAL HIGH (ref 4.0–10.5)
nRBC: 0 % (ref 0.0–0.2)

## 2022-01-17 LAB — CBG MONITORING, ED: Glucose-Capillary: 247 mg/dL — ABNORMAL HIGH (ref 70–99)

## 2022-01-17 LAB — SALICYLATE LEVEL: Salicylate Lvl: <7 mg/dL — ABNORMAL LOW (ref 7.0–30.0)

## 2022-01-17 LAB — LIPASE, BLOOD: Lipase: 185 U/L — ABNORMAL HIGH (ref 11–51)

## 2022-01-17 LAB — BETA-HYDROXYBUTYRIC ACID: Beta-Hydroxybutyric Acid: 1.52 mmol/L — ABNORMAL HIGH (ref 0.05–0.27)

## 2022-01-17 LAB — ETHANOL: Alcohol, Ethyl (B): <10 mg/dL (ref <10)

## 2022-01-17 LAB — MAGNESIUM: Magnesium: 1.8 mg/dL (ref 1.7–2.4)

## 2022-01-17 MED ORDER — IOHEXOL 300 MG/ML  SOLN
80.0000 mL | Freq: Once | INTRAMUSCULAR | Status: AC | PRN
  Administered 2022-01-17: 80 mL via INTRAVENOUS

## 2022-01-17 MED ORDER — INSULIN ASPART 100 UNIT/ML IJ SOLN
4.0000 [IU] | Freq: Once | INTRAMUSCULAR | Status: AC
  Administered 2022-01-17: 4 [IU] via SUBCUTANEOUS
  Filled 2022-01-17: qty 0.04

## 2022-01-17 MED ORDER — SODIUM CHLORIDE 0.9 % IV BOLUS
1000.0000 mL | Freq: Once | INTRAVENOUS | Status: AC
  Administered 2022-01-17: 1000 mL via INTRAVENOUS

## 2022-01-17 MED ORDER — ACETAMINOPHEN 325 MG PO TABS
650.0000 mg | ORAL_TABLET | Freq: Once | ORAL | Status: DC
  Filled 2022-01-17: qty 2

## 2022-01-17 MED ORDER — INSULIN ASPART 100 UNIT/ML IJ SOLN
1.0000 [IU] | Freq: Three times a day (TID) | INTRAMUSCULAR | Status: DC
  Filled 2022-01-17: qty 0.07

## 2022-01-17 MED ORDER — MIDODRINE HCL 2.5 MG PO TABS
2.5000 mg | ORAL_TABLET | Freq: Three times a day (TID) | ORAL | Status: DC
  Filled 2022-01-17 (×5): qty 1

## 2022-01-17 MED ORDER — KETOROLAC TROMETHAMINE 15 MG/ML IJ SOLN
15.0000 mg | Freq: Once | INTRAMUSCULAR | Status: AC
Start: 2022-01-17 — End: 2022-01-17
  Administered 2022-01-17: 15 mg via INTRAVENOUS
  Filled 2022-01-17: qty 1

## 2022-01-17 MED ORDER — SODIUM CHLORIDE 0.9 % IV SOLN
25.0000 mg | Freq: Once | INTRAVENOUS | Status: AC
  Administered 2022-01-17: 25 mg via INTRAVENOUS
  Filled 2022-01-17: qty 25
  Filled 2022-01-17: qty 1

## 2022-01-17 MED ORDER — ONDANSETRON HCL 4 MG/2ML IJ SOLN
4.0000 mg | Freq: Once | INTRAMUSCULAR | Status: AC
  Administered 2022-01-17: 4 mg via INTRAVENOUS
  Filled 2022-01-17: qty 2

## 2022-01-17 NOTE — ED Provider Notes (Addendum)
[name] DEPT Provider Note   CSN: [id number] Arrival date & time: 01/17/22  1735     History  Chief Complaint  Patient presents with   Hyperglycemia    [name] [name] is a 61 y.o. female.  Patient presents ER chief complaint of nausea vomiting diarrhea.  Symptoms ongoing since yesterday with multiple episodes of vomiting and diarrhea throughout the day.  Denies any blood in her vomitus or diarrhea.  She is also complaining of abdominal pain describes as aching and sharp mid abdominal region.  She was unable to keep down any of her home medications due to her vomiting and presents to the ER.      Home Medications Prior to Admission medications   Medication Sig Start Date End Date Taking? Authorizing Provider  atorvastatin (LIPITOR) 80 MG tablet Take 1 tablet (80 mg total) by mouth daily. 01/12/22   Bedsole, Amy E, MD  Continuous Blood Gluc Sensor (DEXCOM G6 SENSOR) MISC INJECT 1 SENSOR INTO SKIN EVERY 10 DAYS 07/05/21   Bedsole, Amy E, MD  Continuous Blood Gluc Transmit (DEXCOM G6 TRANSMITTER) MISC USE AS DIRECTED FOR CONTINUOUS GLUCOSE MONITORING. REUSE TRANSMITTER X 90DAYS THEN DISCARD & REPLACE 07/05/21   Bedsole, Amy E, MD  cyclobenzaprine (FLEXERIL) 10 MG tablet Take 10 mg by mouth at bedtime. 10/12/21   [provider]  dicyclomine (BENTYL) 10 MG capsule TAKE 1 CAPSULE BY MOUTH TWICE A DAY AS NEEDED FOR SPASMS Patient taking differently: Take 10 mg by mouth 2 (two) times daily as needed for spasms. 09/30/21   Noralyn Pick, NP  gabapentin (NEURONTIN) 300 MG capsule Take 300 mg by mouth 3 (three) times daily as needed (pain). 10/30/20   [provider]  insulin lispro (HUMALOG KWIKPEN) 100 UNIT/ML KwikPen Inject into skin 3 times a day at meal time according to the following sliding scale:  27 units max per day Dx E11.43 Patient taking differently: Inject 1-7 Units into the skin with breakfast, with lunch, and with evening  meal. Per sliding scale 07/21/21   Bedsole, Amy E, MD  Insulin Pen Needle (PEN NEEDLES) 31G X 8 MM MISC Use to inject insulin 4 times a day.  Dx: E11.10 07/21/21   Jinny Sanders, MD  Lancets (ONETOUCH ULTRASOFT) lancets Use to check blood sugar daily as needed.  Dx: E11.10 12/23/17   Jinny Sanders, MD  levothyroxine (SYNTHROID) 175 MCG tablet Take 175 mcg by mouth daily. 06/03/20   [provider]  metoCLOPramide (REGLAN) 5 MG tablet Take 5mg  before breakfast lunch and dinner and 10mg  at bedtime 08/19/20   Nita Sells, MD  midodrine (PROAMATINE) 2.5 MG tablet Take 1 tablet at 7 AM, 12 NOON and 4PM Patient taking differently: Take 2.5 mg by mouth in the morning, at noon, and at bedtime. 09/04/21   Almyra Deforest, PA  naproxen (NAPROSYN) 375 MG tablet Take 1 tablet (375 mg total) by mouth 2 (two) times daily. Patient taking differently: Take 375 mg by mouth 2 (two) times daily as needed for mild pain. 09/13/21   Etta Quill, NP  pantoprazole (PROTONIX) 20 MG tablet Take 1 tablet (20 mg total) by mouth 2 (two) times daily. 06/30/21 01/20/22  Levin Bacon, MD  promethazine (PHENERGAN) 25 MG tablet Take 1 tablet (25 mg total) by mouth every 8 (eight) hours as needed for nausea or vomiting. 01/05/22   Bedsole, Amy E, MD  TOUJEO SOLOSTAR 300 UNIT/ML Solostar Pen Inject 18 Units into the skin at bedtime.  07/21/21   Bedsole, Amy E, MD  Vitamin D, Ergocalciferol, (DRISDOL) 1.25 MG (50000 UNIT) CAPS capsule Take 1 capsule (50,000 Units total) by mouth every 7 (seven) days. 01/12/22   Jinny Sanders, MD      Allergies    Codeine    Review of Systems   Review of Systems  Constitutional:  Negative for fever.  HENT:  Negative for ear pain.   Eyes:  Negative for pain.  Respiratory:  Negative for cough.   Cardiovascular:  Negative for chest pain.  Gastrointestinal:  Positive for abdominal pain.  Genitourinary:  Negative for flank pain.  Musculoskeletal:  Negative for back pain.  Skin:   Negative for rash.  Neurological:  Negative for headaches.   Physical Exam Updated Vital Signs BP 116/78    Pulse 100    Temp 98.1 F (36.7 C) (Oral)    Resp 15    Ht 4\' 11"  (1.499 m)    Wt 51.3 kg    LMP 08/11/2012    SpO2 98%    BMI 22.82 kg/m  Physical Exam Constitutional:      General: She is not in acute distress.    Appearance: Normal appearance.  HENT:     Head: Normocephalic.     Nose: Nose normal.  Eyes:     Extraocular Movements: Extraocular movements intact.  Cardiovascular:     Rate and Rhythm: Normal rate.  Pulmonary:     Effort: Pulmonary effort is normal.  Abdominal:     Tenderness: There is abdominal tenderness. There is no guarding or rebound.     Comments: Diffuse mid abdominal tenderness.  Musculoskeletal:        General: Normal range of motion.     Cervical back: Normal range of motion.  Neurological:     General: No focal deficit present.     Mental Status: She is alert. Mental status is at baseline.    ED Results / Procedures / Treatments   Labs (all labs ordered are listed, but only abnormal results are displayed) Labs Reviewed  CBC WITH DIFFERENTIAL/PLATELET - Abnormal; Notable for the following components:      Result Value   WBC 16.8 (*)    RBC 5.19 (*)    Hemoglobin 15.7 (*)    Neutro Abs 13.8 (*)    Monocytes Absolute 1.3 (*)    Abs Immature Granulocytes 0.11 (*)    All other components within normal limits  COMPREHENSIVE METABOLIC PANEL - Abnormal; Notable for the following components:   Chloride 90 (*)    Glucose, Bld 241 (*)    BUN 21 (*)    Anion gap 16 (*)    All other components within normal limits  LIPASE, BLOOD - Abnormal; Notable for the following components:   Lipase 185 (*)    All other components within normal limits  URINALYSIS, ROUTINE W REFLEX MICROSCOPIC - Abnormal; Notable for the following components:   APPearance HAZY (*)    Glucose, UA >=500 (*)    Hgb urine dipstick SMALL (*)    Ketones, ur 20 (*)    Protein,  ur 100 (*)    Leukocytes,Ua TRACE (*)    Bacteria, UA RARE (*)    All other components within normal limits  BETA-HYDROXYBUTYRIC ACID - Abnormal; Notable for the following components:   Beta-Hydroxybutyric Acid 1.52 (*)    All other components within normal limits  ACETAMINOPHEN LEVEL - Abnormal; Notable for the following components:   Acetaminophen (Tylenol), Serum <10 (*)  All other components within normal limits  SALICYLATE LEVEL - Abnormal; Notable for the following components:   Salicylate Lvl <9.2 (*)    All other components within normal limits  CBG MONITORING, ED - Abnormal; Notable for the following components:   Glucose-Capillary 247 (*)    All other components within normal limits  RESP PANEL BY RT-PCR (FLU A&B, COVID) ARPGX2  ETHANOL  MAGNESIUM    EKG EKG Interpretation  Date/Time:  Wednesday January 17 2022 17:48:55 EST Ventricular Rate:  102 PR Interval:  128 QRS Duration: 92 QT Interval:  389 QTC Calculation: 507 R Axis:   95 Text Interpretation: Sinus tachycardia Consider right atrial enlargement Anteroseptal infarct, age indeterminate ST elevation, consider inferior injury Artifact in lead(s) I III aVR aVL V2 Confirmed by Thamas Jaegers ([id number]) on 01/17/2022 7:21:47 PM  Radiology CT Abdomen Pelvis W Contrast  Result Date: 01/17/2022 CLINICAL DATA:  Acute nonlocalized abdominal pain. Nausea, vomiting, and diarrhea. EXAM: CT ABDOMEN AND PELVIS WITH CONTRAST TECHNIQUE: Multidetector CT imaging of the abdomen and pelvis was performed using the standard protocol following bolus administration of intravenous contrast. RADIATION DOSE REDUCTION: This exam was performed according to the departmental dose-optimization program which includes automated exposure control, adjustment of the mA and/or kV according to patient size and/or use of iterative reconstruction technique. CONTRAST:  55mL OMNIPAQUE IOHEXOL 300 MG/ML  SOLN COMPARISON:  06/30/2021 FINDINGS: Lower chest: The  lung bases are clear. Mild wall thickening of the distal esophagus may represent reflux disease or esophagitis. Hepatobiliary: Diffuse fatty infiltration of the liver with area of focal fatty change in the medial segment left lobe of liver. Gallbladder and bile ducts are unremarkable. Pancreas: Unremarkable. No pancreatic ductal dilatation or surrounding inflammatory changes. Spleen: Normal in size without focal abnormality. Adrenals/Urinary Tract: Adrenal glands are unremarkable. Kidneys are normal, without renal calculi, focal lesion, or hydronephrosis. Bladder is unremarkable. Stomach/Bowel: Stomach, small bowel, and colon are not abnormally distended. Suggestion of wall thickening around the pyloric region of the stomach and duodenal bulb which could indicate gastritis/duodenitis. Scattered colonic diverticula without evidence of acute diverticulitis. The appendix is normal. Vascular/Lymphatic: No significant vascular findings are present. No enlarged abdominal or pelvic lymph nodes. Reproductive: Status post hysterectomy. No adnexal masses. Other: No free air or free fluid in the abdomen. Minimal periumbilical hernia containing fat. Musculoskeletal: No acute or significant osseous findings. IMPRESSION: 1. Suggestion of edema and wall thickening around the pyloric region of the stomach and duodenal bulb suggesting gastritis/duodenitis or possibly peptic ulcer disease. 2. Mild thickening of the distal esophagus likely represents reflux disease. 3. Diffuse fatty infiltration of the liver with focal area of increased fatty change. Electronically Signed   By: Lucienne Capers M.D.   On: 01/17/2022 22:33    Procedures Procedures    Medications Ordered in ED Medications  acetaminophen (TYLENOL) tablet 650 mg (650 mg Oral Patient Refused/Not Given 01/17/22 2306)  sodium chloride 0.9 % bolus 1,000 mL (0 mLs Intravenous Stopped 01/17/22 2207)  ketorolac (TORADOL) 15 MG/ML injection 15 mg (15 mg Intravenous Given  01/17/22 2000)  ondansetron (ZOFRAN) injection 4 mg (4 mg Intravenous Given 01/17/22 2000)  iohexol (OMNIPAQUE) 300 MG/ML solution 80 mL (80 mLs Intravenous Contrast Given 01/17/22 2209)  insulin aspart (novoLOG) injection 4 Units (4 Units Subcutaneous Given 01/17/22 2305)  promethazine (PHENERGAN) 25 mg in sodium chloride 0.9 % 50 mL IVPB (25 mg Intravenous New Bag/Given 01/17/22 2316)  sodium chloride 0.9 % bolus 1,000 mL (1,000 mLs Intravenous New Bag/Given 01/17/22 2306)  ED Course/ Medical Decision Making/ A&P                           Medical Decision Making Amount and/or Complexity of Data Reviewed Labs: ordered. Radiology: ordered.  Risk OTC drugs. Prescription drug management.   Chart review shows primary care office visit January 05, 2022 for routine evaluation.  Labs show white count of 16 chemistry otherwise unremarkable.  Anion gap elevated mildly to 16 however bicarb is normal 29 blood glucose mildly elevated at 240.  Lipase elevated at 185.  Given elevated white count and symptoms CT abdomen pelvis pursued consistent with gastritis or enteritis.  Patient given IV fluids Zofran and pain medication without improvement of symptoms.  Has persistent nausea persistent vomiting.  Additional IV fluids ordered and Phenergan ordered as well. After Phenergan patient appears to be doing much better no longer having any nausea or vomiting.  However the husband came to me and stated that the patient has been making statements that she does not want to deal with anything anymore.  He states he is concerned that she is depressed and having suicidal thoughts.  Patient states that she does feel depressed.  When asked if she has any thoughts of self-harm she would not answer me.  Patient otherwise medically cleared and pending TTS consultation.  Final Clinical Impression(s) / ED Diagnoses Final diagnoses:  Nausea and vomiting, unspecified vomiting type  Epigastric pain  Depression,  unspecified depression type    Rx / DC Orders ED Discharge Orders     None         Luna Fuse, MD 01/17/22 2319    Luna Fuse, MD 01/17/22 [phone]

## 2022-01-17 NOTE — ED Triage Notes (Signed)
Ems brings pt in from home. Pt complains of nausea, vomiting, diarrhea since yesterday. Pt also complains of sore throat.

## 2022-01-18 LAB — COMPREHENSIVE METABOLIC PANEL
ALT: 13 U/L (ref 0–44)
AST: 22 U/L (ref 15–41)
Albumin: 3.4 g/dL — ABNORMAL LOW (ref 3.5–5.0)
Alkaline Phosphatase: 51 U/L (ref 38–126)
Anion gap: 8 (ref 5–15)
BUN: 15 mg/dL (ref 6–20)
CO2: 26 mmol/L (ref 22–32)
Calcium: 7.7 mg/dL — ABNORMAL LOW (ref 8.9–10.3)
Chloride: 102 mmol/L (ref 98–111)
Creatinine, Ser: 0.46 mg/dL (ref 0.44–1.00)
GFR, Estimated: >60 mL/min (ref >60)
Glucose, Bld: 203 mg/dL — ABNORMAL HIGH (ref 70–99)
Potassium: 2.4 mmol/L — CL (ref 3.5–5.1)
Sodium: 136 mmol/L (ref 135–145)
Total Bilirubin: 0.7 mg/dL (ref 0.3–1.2)
Total Protein: 6.3 g/dL — ABNORMAL LOW (ref 6.5–8.1)

## 2022-01-18 LAB — RESP PANEL BY RT-PCR (FLU A&B, COVID) ARPGX2
Influenza A by PCR: NEGATIVE
Influenza B by PCR: NEGATIVE
SARS Coronavirus 2 by RT PCR: NEGATIVE

## 2022-01-18 LAB — CBG MONITORING, ED
Glucose-Capillary: 185 mg/dL — ABNORMAL HIGH (ref 70–99)
Glucose-Capillary: 188 mg/dL — ABNORMAL HIGH (ref 70–99)
Glucose-Capillary: 198 mg/dL — ABNORMAL HIGH (ref 70–99)
Glucose-Capillary: 199 mg/dL — ABNORMAL HIGH (ref 70–99)
Glucose-Capillary: 253 mg/dL — ABNORMAL HIGH (ref 70–99)

## 2022-01-18 LAB — BETA-HYDROXYBUTYRIC ACID: Beta-Hydroxybutyric Acid: 1 mmol/L — ABNORMAL HIGH (ref 0.05–0.27)

## 2022-01-18 MED ORDER — ONDANSETRON HCL 4 MG PO TABS
4.0000 mg | ORAL_TABLET | Freq: Once | ORAL | Status: AC
Start: 2022-01-18 — End: 2022-01-18
  Administered 2022-01-18: 4 mg via ORAL
  Filled 2022-01-18: qty 1

## 2022-01-18 MED ORDER — PANTOPRAZOLE SODIUM 20 MG PO TBEC
20.0000 mg | DELAYED_RELEASE_TABLET | Freq: Two times a day (BID) | ORAL | Status: DC
  Administered 2022-01-18 – 2022-01-19 (×3): 20 mg via ORAL
  Filled 2022-01-18 (×3): qty 1

## 2022-01-18 MED ORDER — POTASSIUM CHLORIDE 20 MEQ PO PACK
40.0000 meq | PACK | Freq: Three times a day (TID) | ORAL | Status: DC
  Administered 2022-01-18 – 2022-01-19 (×2): 40 meq via ORAL
  Filled 2022-01-18 (×3): qty 2

## 2022-01-18 MED ORDER — INSULIN GLARGINE-YFGN 100 UNIT/ML ~~LOC~~ SOLN
18.0000 [IU] | Freq: Every day | SUBCUTANEOUS | Status: DC
  Administered 2022-01-18: 18 [IU] via SUBCUTANEOUS
  Filled 2022-01-18 (×2): qty 0.18

## 2022-01-18 MED ORDER — METOCLOPRAMIDE HCL 10 MG PO TABS: 5.0000 mg | ORAL_TABLET | Freq: Three times a day (TID) | ORAL | Status: DC | PRN

## 2022-01-18 MED ORDER — POTASSIUM CHLORIDE 10 MEQ/100ML IV SOLN
10.0000 meq | Freq: Once | INTRAVENOUS | Status: AC
  Administered 2022-01-18: 10 meq via INTRAVENOUS
  Filled 2022-01-18: qty 100

## 2022-01-18 MED ORDER — ONDANSETRON 4 MG PO TBDP
ORAL_TABLET | ORAL | Status: AC
  Administered 2022-01-18: 4 mg
  Filled 2022-01-18: qty 1

## 2022-01-18 MED ORDER — INSULIN GLARGINE (1 UNIT DIAL) 300 UNIT/ML ~~LOC~~ SOPN: 18.0000 [IU] | PEN_INJECTOR | Freq: Every day | SUBCUTANEOUS | Status: DC

## 2022-01-18 MED ORDER — VITAMIN D (ERGOCALCIFEROL) 1.25 MG (50000 UNIT) PO CAPS: 50000.0000 [IU] | ORAL_CAPSULE | ORAL | Status: DC

## 2022-01-18 MED ORDER — INSULIN ASPART 100 UNIT/ML IJ SOLN
0.0000 [IU] | Freq: Three times a day (TID) | INTRAMUSCULAR | Status: DC
  Administered 2022-01-18: 1 [IU] via SUBCUTANEOUS
  Administered 2022-01-18: 3 [IU] via SUBCUTANEOUS
  Administered 2022-01-18: 1 [IU] via SUBCUTANEOUS
  Filled 2022-01-18: qty 0.06

## 2022-01-18 MED ORDER — LIDOCAINE VISCOUS HCL 2 % MT SOLN
15.0000 mL | Freq: Once | OROMUCOSAL | Status: AC
  Administered 2022-01-18: 15 mL via ORAL
  Filled 2022-01-18: qty 15

## 2022-01-18 MED ORDER — POTASSIUM CHLORIDE CRYS ER 20 MEQ PO TBCR: 40.0000 meq | EXTENDED_RELEASE_TABLET | Freq: Once | ORAL | Status: DC

## 2022-01-18 MED ORDER — DICYCLOMINE HCL 10 MG/5ML PO SOLN
10.0000 mg | Freq: Once | ORAL | Status: AC
  Administered 2022-01-18: 10 mg via ORAL
  Filled 2022-01-18: qty 5

## 2022-01-18 MED ORDER — SODIUM CHLORIDE 0.9 % IV SOLN
25.0000 mg | Freq: Once | INTRAVENOUS | Status: AC
  Administered 2022-01-18: 25 mg via INTRAVENOUS
  Filled 2022-01-18: qty 1

## 2022-01-18 MED ORDER — FAMOTIDINE 20 MG PO TABS
40.0000 mg | ORAL_TABLET | Freq: Once | ORAL | Status: AC
  Administered 2022-01-18: 40 mg via ORAL
  Filled 2022-01-18: qty 2

## 2022-01-18 MED ORDER — ALUM & MAG HYDROXIDE-SIMETH 200-200-20 MG/5ML PO SUSP
30.0000 mL | Freq: Once | ORAL | Status: AC
  Administered 2022-01-18: 30 mL via ORAL
  Filled 2022-01-18: qty 30

## 2022-01-18 MED ORDER — ATORVASTATIN CALCIUM 40 MG PO TABS
80.0000 mg | ORAL_TABLET | Freq: Every day | ORAL | Status: DC
  Administered 2022-01-18 – 2022-01-19 (×2): 80 mg via ORAL
  Filled 2022-01-18 (×2): qty 2

## 2022-01-18 MED ORDER — LEVOTHYROXINE SODIUM 50 MCG PO TABS
175.0000 ug | ORAL_TABLET | Freq: Every day | ORAL | Status: DC
  Administered 2022-01-19: 175 ug via ORAL
  Filled 2022-01-18: qty 1

## 2022-01-18 MED ORDER — POTASSIUM CHLORIDE CRYS ER 20 MEQ PO TBCR
40.0000 meq | EXTENDED_RELEASE_TABLET | Freq: Three times a day (TID) | ORAL | Status: DC
  Filled 2022-01-18: qty 2

## 2022-01-18 MED ORDER — PROMETHAZINE HCL 25 MG PO TABS
25.0000 mg | ORAL_TABLET | Freq: Three times a day (TID) | ORAL | Status: DC | PRN
  Administered 2022-01-18 (×2): 25 mg via ORAL
  Filled 2022-01-18 (×2): qty 1

## 2022-01-18 MED ORDER — SUCRALFATE 1 G PO TABS
1.0000 g | ORAL_TABLET | Freq: Once | ORAL | Status: AC
  Administered 2022-01-18: 1 g via ORAL
  Filled 2022-01-18: qty 1

## 2022-01-18 NOTE — BH Assessment (Signed)
@  1053, requested patient's nurse Jarrett Soho, RN) to set up the TTS machine.

## 2022-01-18 NOTE — ED Provider Notes (Signed)
Emergency Medicine Observation Re-evaluation Note  [name] [name] is a 61 y.o. female, seen on rounds today at 0700.  Pt initially presented to the ED for complaints of Hyperglycemia Currently, the patient is resting comfortably.  Physical Exam  BP 118/81 (BP Location: Left Arm)    Pulse 75    Temp 98.1 F (36.7 C) (Oral)    Resp 16    Ht 4\' 11"  (1.499 m)    Wt 51.3 kg    LMP 08/11/2012    SpO2 95%    BMI 22.82 kg/m  Physical Exam General: NAD   ED Course / MDM  EKG:EKG Interpretation  Date/Time:  Wednesday January 17 2022 17:48:55 EST Ventricular Rate:  102 PR Interval:  128 QRS Duration: 92 QT Interval:  389 QTC Calculation: 507 R Axis:   95 Text Interpretation: Sinus tachycardia Consider right atrial enlargement Anteroseptal infarct, age indeterminate ST elevation, consider inferior injury Artifact in lead(s) I III aVR aVL V2 Confirmed by Thamas Jaegers ([id number]) on 01/17/2022 7:21:47 PM  I have reviewed the labs performed to date as well as medications administered while in observation.  Recent changes in the last 24 hours include no reported acute events.  Plan  Current plan is for pending TTS evaluation.  Debara C Ala is not under involuntary commitment.     Valarie Merino, MD 01/18/22 [phone]

## 2022-01-18 NOTE — ED Provider Notes (Addendum)
Called to see patient due to abdominal discomfort.  All records reviewed and patient recent abdominal CT which did show gastritis/duodenitis from peptic ulcer disease.  Patient will be treated with GI cocktail and Carafate and Pepcid.  Patient has been ordered oral potassium due to hypokalemia which likely is exacerbating her abdominal complaints.  Will order IV potassium here.   Lacretia Leigh, MD 01/18/22 2116    Lacretia Leigh, MD 01/18/22 2118

## 2022-01-18 NOTE — BH Assessment (Signed)
Clinician messaged Felicia D. Carlota Raspberry, RN: "Hey. It's Trey with TTS. Is the pt able to engage in the assessment, if so the pt will need to be placed in a private room. Also is the pt under IVC?"   Clinician awaiting response.    Vertell Novak, [place], [facility], [facility] Triage Specialist [phone]

## 2022-01-18 NOTE — ED Notes (Signed)
Pt ambulatory to restroom without assistance 

## 2022-01-19 ENCOUNTER — Inpatient Hospital Stay (HOSPITAL_COMMUNITY): Admission: AD | Admit: 2022-01-19 | Payer: BC Managed Care – PPO | Source: Intra-hospital | Admitting: Psychiatry

## 2022-01-19 DIAGNOSIS — F331 Major depressive disorder, recurrent, moderate: Secondary | ICD-10-CM

## 2022-01-19 LAB — CBG MONITORING, ED: Glucose-Capillary: 136 mg/dL — ABNORMAL HIGH (ref 70–99)

## 2022-01-19 LAB — BASIC METABOLIC PANEL
Anion gap: 5 (ref 5–15)
BUN: 18 mg/dL (ref 6–20)
CO2: 24 mmol/L (ref 22–32)
Calcium: 7.9 mg/dL — ABNORMAL LOW (ref 8.9–10.3)
Chloride: 105 mmol/L (ref 98–111)
Creatinine, Ser: 0.46 mg/dL (ref 0.44–1.00)
GFR, Estimated: >60 mL/min (ref >60)
Glucose, Bld: 156 mg/dL — ABNORMAL HIGH (ref 70–99)
Potassium: 3.6 mmol/L (ref 3.5–5.1)
Sodium: 134 mmol/L — ABNORMAL LOW (ref 135–145)

## 2022-01-19 MED ORDER — SUCRALFATE 1 GM/10ML PO SUSP: 1.0000 g | Freq: Three times a day (TID) | ORAL | 0 refills | Status: DC
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
## 2022-01-19 NOTE — Consult Note (Signed)
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 imminent risk to self or others at present.   Patient does not meet criteria for psychiatric inpatient admission. Supportive therapy provided about ongoing stressors. Discussed crisis plan, support from social network, calling 911, coming to the Emergency Department, and calling Suicide Hotline. Patient denies any suicidal or homicidal ideations and contracts for safety; provider spoke to husband at  length who denies any concerns for patient returning home and following up with outpatient therapy for management of depressive symptoms.   This service was provided via telemedicine using a 2-way, interactive audio and video technology.  Names of all persons participating in this telemedicine service and their  role in this encounter. Name: Oneida Alar Role: PMHNP  Name: Hampton Abbot Role: Attending MD  Name: Pricilla Holm. [place] Role: patient  Name:  Role:     Inda Merlin, NP 01/19/2022 10:05 AM

## 2022-01-19 NOTE — ED Provider Notes (Signed)
Patient is a 61 year old female who was initially seen here for abdominal pain with gastritis.  She had associated vomiting.  She is feeling better now and is tolerating oral intake.  No ongoing vomiting.  She was cleared medically and was expressing vague suicidal ideation so had a psychiatric consult.  They initially recommended inpatient treatment but currently patient wants to go home.  I consulted with the behavioral health team again and they are okay with patient going home.  They are going to set her up for a partial inpatient treatment program.  We will prescribe her Carafate for her symptoms.  She says she already has Protonix and Zofran at home.  Instructed her to follow-up with her PCP regarding this.  Return precautions were given.   Malvin Johns, MD 01/19/22 1029

## 2022-01-19 NOTE — BH Assessment (Addendum)
Long Assessment Progress Note   Per Oneida Alar, NP, this voluntary pt does not require psychiatric hospitalization at this time.  Pt is psychiatrically cleared.  Jerene Pitch finds that pt would benefit from the Partial Hospitalization Program offered by the [facility] [place].  The program is currently virtual due to Covid-19.  This Probation officer has spoken to pt about the program, and she is interested.  At 10:05 I reached out to [facility], LCSW to schedule a start date, but as of this writing I have not heard back.  Discharge instructions include generic information about the program.  I will add the start date when it becomes available.  Pt has been given a registration packet for the Outpatient Clinic, which she has been asked to complete prior to her intake appointment.  EDP Malvin Johns, MD and pt's nurse, Abby, have been notified.  Jalene Mullet, [place] Triage Specialist [phone]   Addendum:  Sonia Baller has scheduled pt for an intake appointment on Wednesday, 01/24/2022 at 14:00.  This has been included in pt's discharge instructions.  At 11:07 I called the pt at home, offering appointment and contact information verbally.  Pt's contacts have been sent to [facility] via e-mail.  Jalene Mullet, [place] Coordinator [phone]

## 2022-01-19 NOTE — ED Notes (Signed)
Pt reports feeling much better and is requesting to go home at this time. MD aware

## 2022-01-19 NOTE — Discharge Instructions (Addendum)
For your behavioral health needs, you are advised to follow up with the Partial Hospitalization Program (PHP) at the [facility] at [place] [place].  This program meets Monday - Friday from 9:00 am - 1:00 pm.  Due to Covid-19, this program is currently virtual.  You are scheduled for an intake appointment on Wednesday, January 24, 2022 at 2:00 pm.  If you have any questions, contact Lorin Glass, LCSW at the phone number indicated below:       [facility]. [address], [place] [address]      Contact person: Lorin Glass, LCSW      [phone]    Follow-up with your primary care doctor for recheck regarding your management of your diabetes and gastritis.

## 2022-01-24 ENCOUNTER — Ambulatory Visit (HOSPITAL_COMMUNITY): Payer: BC Managed Care – PPO | Admitting: Licensed Clinical Social Worker
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
## 2022-01-24 NOTE — Plan of Care (Signed)
Pt agrees to tx plan °

## 2022-01-25 ENCOUNTER — Telehealth (HOSPITAL_COMMUNITY): Payer: Self-pay | Admitting: Licensed Clinical Social Worker

## 2022-01-29 ENCOUNTER — Ambulatory Visit (HOSPITAL_COMMUNITY): Payer: BC Managed Care – PPO

## 2022-01-29 ENCOUNTER — Other Ambulatory Visit (HOSPITAL_COMMUNITY): Payer: BC Managed Care – PPO

## 2022-01-30 ENCOUNTER — Ambulatory Visit (HOSPITAL_COMMUNITY): Payer: BC Managed Care – PPO

## 2022-01-30 ENCOUNTER — Other Ambulatory Visit (HOSPITAL_COMMUNITY): Payer: BC Managed Care – PPO

## 2022-01-31 ENCOUNTER — Ambulatory Visit (HOSPITAL_COMMUNITY): Payer: BC Managed Care – PPO

## 2022-01-31 ENCOUNTER — Other Ambulatory Visit (HOSPITAL_COMMUNITY): Payer: BC Managed Care – PPO

## 2022-02-01 ENCOUNTER — Ambulatory Visit (HOSPITAL_COMMUNITY): Payer: BC Managed Care – PPO

## 2022-02-01 ENCOUNTER — Other Ambulatory Visit (HOSPITAL_COMMUNITY): Payer: BC Managed Care – PPO

## 2022-02-02 ENCOUNTER — Ambulatory Visit (HOSPITAL_COMMUNITY): Payer: BC Managed Care – PPO

## 2022-02-02 ENCOUNTER — Other Ambulatory Visit (HOSPITAL_COMMUNITY): Payer: BC Managed Care – PPO

## 2022-02-03 NOTE — Progress Notes (Unsigned)
Cardiology Office Note   Date:  02/03/2022   ID:  [name] [name], DOB 07/05/1961, MRN [id number]  PCP:  Jinny Sanders, MD  Cardiologist:   Minus Breeding, MD   No chief complaint on file.     History of Present Illness: [name] [name] is a 61 y.o. female who presents for follow up of a non ischemic cardiomyopathy.   She had normal coronaries on catheterization in 2012.  Repeat echocardiogram in 2018 revealed an EF of 35 to 40%.  Repeat cardiac catheterization demonstrated normal coronaries.  She was noted to have very poorly controlled diabetes.  She has been hospitalized on several occasions due to DKA.  (Most recently last week.  I reviewed these records for this visit.)   She has a history of medical noncompliance.  In July she was seen to follow up hypotension.    She had chest pain but a low risk perfusion study.  It is likely that her pain was related to her esophagitis and ulcers. She had dizziness which I suggested was related to orthostatic hypotension.  ZIO patch did not demonstrate any arrhythmias.   I did try to increase her midodrine to 5 mg tid but she could not tolerate this.  ***   ***   She is wearing compression garments.  At the last visit she was started on midodrine.  She brings me some blood pressures that are better than previous.  She still has drops that she is standing and I see some recorded in the 82U systolic.  However, this seems to be improved and she is not having as much dizziness.  She is only been taking her midodrine as needed.  She denies any new chest discomfort but she has had a chronic discomfort and this has not changed.  She complains of continued fatigue.   Past Medical History:  Diagnosis Date   Allergy    seasonal   Anxiety    B12 deficiency    Chest pain 04/05/2017   CHF (congestive heart failure) ([place])    Diabetes mellitus type 1, uncontrolled    "dr. 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) 10/29/2013   Leg cramping    "@ night" (09/18/2013)   Migraine    "once q couple months" (11/26/2018)   Osteoporosis    Peripheral neuropathy    Umbilical hernia    unrepaired (09/18/2013)    Past Surgical History:  Procedure Laterality Date   BIOPSY  06/03/2019   Procedure: BIOPSY;  Surgeon: Jackquline Denmark, MD;  Location: [facility];  Service: Endoscopy;;   BIOPSY  08/17/2020   Procedure: BIOPSY;  Surgeon: Thornton Park, MD;  Location: [facility];  Service: Gastroenterology;;   [name]   ENTEROSCOPY N/A 06/03/2019   Procedure: ENTEROSCOPY;  Surgeon: Jackquline Denmark, MD;  Location: [facility];  Service: Endoscopy;  Laterality: N/A;   ESOPHAGOGASTRODUODENOSCOPY (EGD) WITH PROPOFOL N/A 08/17/2020   Procedure: ESOPHAGOGASTRODUODENOSCOPY (EGD) WITH PROPOFOL;  Surgeon: Thornton Park, MD;  Location: [facility];  Service: Gastroenterology;  Laterality: N/A;   LAPAROSCOPIC ASSISTED VAGINAL HYSTERECTOMY  09/23/2012   Procedure: LAPAROSCOPIC ASSISTED VAGINAL HYSTERECTOMY;  Surgeon: Linda Hedges, DO;  Location: [facility];  Service: Gynecology;  Laterality: N/A;  pull Dr Gregor Hams instrument   LEFT HEART CATH AND CORONARY ANGIOGRAPHY N/A 02/11/2017   Procedure: Left Heart Cath and Coronary Angiography;  Surgeon: Lorretta Harp, MD;  Location: [facility];  Service: Cardiovascular;  Laterality: N/A;   TUBAL LIGATION Bilateral 1992     Current Outpatient Medications  Medication Sig Dispense Refill   atorvastatin (LIPITOR) 80 MG tablet Take 1 tablet (80 mg total) by mouth daily. 90 tablet 3   Continuous Blood Gluc Sensor (DEXCOM G6 SENSOR) MISC INJECT 1 SENSOR INTO SKIN EVERY 10 DAYS 3 each 11   Continuous Blood Gluc Transmit (DEXCOM G6 TRANSMITTER) MISC  USE AS DIRECTED FOR CONTINUOUS GLUCOSE MONITORING. REUSE TRANSMITTER X 90DAYS THEN DISCARD & REPLACE 1 each 3   cyclobenzaprine (FLEXERIL) 10 MG tablet Take 10 mg by mouth at bedtime.     dicyclomine (BENTYL) 10 MG capsule TAKE 1 CAPSULE BY MOUTH TWICE A DAY AS NEEDED FOR SPASMS (Patient taking differently: Take 10 mg by mouth 2 (two) times daily as needed for spasms.) 60 capsule 2   gabapentin (NEURONTIN) 300 MG capsule Take 300 mg by mouth 3 (three) times daily as needed (pain).     insulin lispro (HUMALOG KWIKPEN) 100 UNIT/ML KwikPen Inject into skin 3 times a day at meal time according to the following sliding scale:  27 units max per day Dx E11.43 (Patient taking differently: Inject 1-9 Units into the skin with breakfast, with lunch, and with evening meal. Per sliding scale) 15 mL 11   Insulin Pen Needle (PEN NEEDLES) 31G X 8 MM MISC Use to inject insulin 4 times a day.  Dx: E11.10 130 each 11   Lancets (ONETOUCH ULTRASOFT) lancets Use to check blood sugar daily as needed.  Dx: E11.10 100 each 3   levothyroxine (SYNTHROID) 175 MCG tablet Take 175 mcg by mouth daily before breakfast.     metoCLOPramide (REGLAN) 5 MG tablet Take 5mg  before breakfast lunch and dinner and 10mg  at bedtime 150 tablet 11   midodrine (PROAMATINE) 2.5 MG tablet Take 1 tablet at 7 AM, 12 NOON and 4PM (Patient taking differently: Take 2.5 mg by mouth daily after breakfast.) 90 tablet 5   pantoprazole (PROTONIX) 20 MG tablet Take 1 tablet (20 mg total) by mouth 2 (two) times daily. 60 tablet 0   promethazine (PHENERGAN) 25 MG tablet Take 1 tablet (25 mg total) by mouth every 8 (eight) hours as needed for nausea or vomiting. 30 tablet 3   sucralfate (CARAFATE) 1 GM/10ML suspension Take 10 mLs (1 g total) by mouth 4 (four) times daily -  with meals and at bedtime. 420 mL 0   TOUJEO SOLOSTAR 300 UNIT/ML Solostar Pen Inject 18 Units into the skin at bedtime. 6 mL 3   Vitamin D, Ergocalciferol, (DRISDOL) 1.25 MG (50000 UNIT) CAPS  capsule Take 1 capsule (50,000 Units total) by mouth every 7 (seven) days. (Patient taking differently: Take 50,000 Units by mouth every 7 (seven) days. Monday) 12 capsule 0   No current facility-administered medications for this visit.    Allergies:   Codeine    ROS:  Please see the history of present illness.   Otherwise, review of systems are positive for ***.   All other systems are reviewed and negative.    PHYSICAL EXAM: VS:  LMP 08/11/2012  , BMI There is no height or weight on file to calculate BMI.  GENERAL:  Well appearing NECK:  No jugular venous distention, waveform within normal  limits, carotid upstroke brisk and symmetric, no bruits, no thyromegaly LUNGS:  Clear to auscultation bilaterally CHEST:  Unremarkable HEART:  PMI not displaced or sustained,S1 and S2 within normal limits, no S3, no S4, no clicks, no rubs, *** murmurs ABD:  Flat, positive bowel sounds normal in frequency in pitch, no bruits, no rebound, no guarding, no midline pulsatile mass, no hepatomegaly, no splenomegaly EXT:  2 plus pulses throughout, no edema, no cyanosis no clubbing    ***GENERAL:  Well appearing NECK:  No jugular venous distention, waveform within normal limits, carotid upstroke brisk and symmetric, no bruits, no thyromegaly LUNGS:  Clear to auscultation bilaterally CHEST:  Unremarkable HEART:  PMI not displaced or sustained,S1 and S2 within normal limits, no S3, no S4, no clicks, no rubs, no murmurs ABD:  Flat, positive bowel sounds normal in frequency in pitch, no bruits, no rebound, no guarding, no midline pulsatile mass, no hepatomegaly, no splenomegaly EXT:  2 plus pulses throughout, no edema, no cyanosis no clubbing   EKG:  EKG is *** ordered today. Rhythm, rate ***, axis within normal limits, intervals within normal limits, no acute ST-T wave changes.   Recent Labs: 06/30/2021: B Natriuretic Peptide 14.9 01/05/2022: TSH 53.58 01/17/2022: Hemoglobin 15.7; Magnesium 1.8; Platelets  332 01/18/2022: ALT 13 01/19/2022: BUN 18; Creatinine, Ser 0.46; Potassium 3.6; Sodium 134    Lipid Panel    Component Value Date/Time   CHOL 269 (H) 01/05/2022 1629   TRIG 184 (H) 01/05/2022 1629   HDL 47 (L) 01/05/2022 1629   CHOLHDL 5.7 (H) 01/05/2022 1629   VLDL 29.6 01/27/2019 0809   LDLCALC 186 (H) 01/05/2022 1629   LDLDIRECT 206.0 07/27/2014 0745      Wt Readings from Last 3 Encounters:  01/17/22 113 lb (51.3 kg)  01/05/22 113 lb (51.3 kg)  11/24/21 108 lb 0.4 oz (49 kg)      Other studies Reviewed: Additional studies/ records that were reviewed today include:   *** Review of the above records demonstrates:    See elsewhere   ASSESSMENT AND PLAN:  Hypotension:    ***  I am going to increase her midodrine to 2.5 mg scheduled twice daily.  She can take it at 7 AM and will now.  She might need a third dose and we will see over time.  She could also have a dose increase.   Ongoing chest pressure:  *** This is change from previous.  She had a negative perfusion study.  This does not sound anginal.  She has other etiologies.  No change in therapy  Insulin-dependent diabetes:    A1c is *** 13.3.  She might have converted to type 1 diabetes she tells me.  She is being followed by an endocrinologist.    Hyperlipidemia:   LDL was *** most recently 124.  I am going to have her repeat a lipid profile.  If she is not at least below 100 she might need to take Zetia.     Current medicines are reviewed at length with the patient today.  The patient does not have concerns regarding medicines.  The following changes have been made:  ***   Labs/ tests ordered today include:  ***   No orders of the defined types were placed in this encounter.    Disposition:   FU with *** months.   Signed, Minus Breeding, MD  02/03/2022 12:42 PM    [facility]

## 2022-02-05 ENCOUNTER — Ambulatory Visit: Payer: 59 | Admitting: Cardiology

## 2022-02-05 ENCOUNTER — Ambulatory Visit (HOSPITAL_COMMUNITY): Payer: BC Managed Care – PPO

## 2022-02-05 DIAGNOSIS — I951 Orthostatic hypotension: Secondary | ICD-10-CM

## 2022-02-05 DIAGNOSIS — R0789 Other chest pain: Secondary | ICD-10-CM

## 2022-02-05 DIAGNOSIS — E118 Type 2 diabetes mellitus with unspecified complications: Secondary | ICD-10-CM

## 2022-02-05 DIAGNOSIS — E785 Hyperlipidemia, unspecified: Secondary | ICD-10-CM

## 2022-02-06 ENCOUNTER — Ambulatory Visit: Payer: BC Managed Care – PPO | Admitting: Nurse Practitioner

## 2022-02-06 ENCOUNTER — Other Ambulatory Visit (HOSPITAL_COMMUNITY): Payer: BC Managed Care – PPO

## 2022-02-06 ENCOUNTER — Ambulatory Visit (HOSPITAL_COMMUNITY): Payer: BC Managed Care – PPO

## 2022-02-07 ENCOUNTER — Ambulatory Visit (HOSPITAL_COMMUNITY): Payer: BC Managed Care – PPO

## 2022-02-08 ENCOUNTER — Ambulatory Visit (HOSPITAL_COMMUNITY): Payer: BC Managed Care – PPO

## 2022-02-09 ENCOUNTER — Ambulatory Visit (HOSPITAL_COMMUNITY): Payer: BC Managed Care – PPO

## 2022-02-12 ENCOUNTER — Ambulatory Visit (HOSPITAL_COMMUNITY): Payer: BC Managed Care – PPO

## 2022-02-13 ENCOUNTER — Ambulatory Visit (HOSPITAL_COMMUNITY): Payer: BC Managed Care – PPO

## 2022-02-14 ENCOUNTER — Ambulatory Visit (HOSPITAL_COMMUNITY): Payer: BC Managed Care – PPO

## 2022-02-15 ENCOUNTER — Ambulatory Visit (HOSPITAL_COMMUNITY): Payer: BC Managed Care – PPO

## 2022-02-16 ENCOUNTER — Ambulatory Visit (HOSPITAL_COMMUNITY): Payer: BC Managed Care – PPO

## 2022-02-25 ENCOUNTER — Other Ambulatory Visit: Payer: Self-pay | Admitting: Family Medicine

## 2022-02-26 ENCOUNTER — Other Ambulatory Visit: Payer: Self-pay | Admitting: Family Medicine

## 2022-03-01 DIAGNOSIS — R072 Precordial pain: Secondary | ICD-10-CM | POA: Insufficient documentation

## 2022-03-01 NOTE — Progress Notes (Signed)
?  ?Cardiology Office Note ? ? ?Date:  03/02/2022  ? ?ID:  [name] [name], DOB 1961/10/10, MRN [id number] ? ?PCP:  Jinny Sanders, MD  ?Cardiologist:   Minus Breeding, MD ? ? ?Chief Complaint  ?Patient presents with  ? Dizziness  ? ? ?  ?History of Present Illness: ?[name] [name] is a 61 y.o. female who presents for follow up of a non ischemic cardiomyopathy.   She had normal coronaries on catheterization in 2012.  Repeat echocardiogram in 2018 revealed an EF of 35 to 40%.  Repeat cardiac catheterization demonstrated normal coronaries.  She was noted to have very poorly controlled diabetes.  She has been hospitalized on several occasions due to DKA.  (Most recently last week.  I reviewed these records for this visit.)   She has a history of medical noncompliance.  In July she was seen to follow up hypotension.    She had chest pain but a low risk perfusion study.  It is likely that her pain was related to her esophagitis and ulcers. She had dizziness which I suggested was related to orthostatic hypotension.  ZIO patch did not demonstrate any arrhythmias.   I did try to increase her midodrine to 5 mg tid but she could not tolerate this.   ? ?She was in the emergency room since I last saw her.  She had nausea and vomiting and I reviewed these records.  She insists that her midodrine was discontinued at that time but I really do not see that in the notes.  She has been taking it once daily since then.  She is actually been feeling better.  She says she is walking 7000 steps.  She does use a cane sometimes for steadiness.  She overall feels better.  Her hemoglobin A1c is a little better.  She is not having the lightheadedness or presyncope that he was having.  She gets some occasional chest discomfort but this has not changed from the time she had a perfusion study on 2021. ? ?Past Medical History:  ?Diagnosis Date  ? Allergy   ? seasonal  ? Anxiety   ? B12 deficiency   ? Chest pain 04/05/2017  ? CHF (congestive heart  failure) ([place])   ? Diabetes mellitus type 1, uncontrolled   ? "dr. just changed me to type 1, uncontrolled" (11/26/2018)  ? DKA (diabetic ketoacidoses) 05/25/2018  ? GERD (gastroesophageal reflux disease)   ? Hyperlipidemia   ? Hypertension   ? Hypothyroidism   ? IBS (irritable bowel syndrome)   ? Intermittent vertigo   ? 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 Leg cramping   ? "@ night" (09/18/2013)  ? Migraine   ? "once q couple months" (11/26/2018)  ? Osteoporosis   ? Peripheral neuropathy   ? Umbilical hernia   ? unrepaired (09/18/2013)  ? ? ?Past Surgical History:  ?Procedure Laterality Date  ? BIOPSY  06/03/2019  ? Procedure: BIOPSY;  Surgeon: Jackquline Denmark, MD;  [facility]: [facility];  Service: Endoscopy;;  ? BIOPSY  08/17/2020  ? Procedure: BIOPSY;  Surgeon: Thornton Park, MD;  Location: [facility];  Service: Gastroenterology;;  ? [place]  ? ENTEROSCOPY N/A 06/03/2019  ? Procedure: ENTEROSCOPY;  Surgeon: Jackquline Denmark, MD;  Location: [facility];  Service: Endoscopy;  Laterality: N/A;  ? ESOPHAGOGASTRODUODENOSCOPY (EGD) WITH PROPOFOL N/A 08/17/2020  ? Procedure: ESOPHAGOGASTRODUODENOSCOPY (EGD) WITH PROPOFOL;  Surgeon: Thornton Park, MD;  Location: [facility];  Service: Gastroenterology;  Laterality: N/A;  ? LAPAROSCOPIC ASSISTED VAGINAL HYSTERECTOMY  09/23/2012  ? Procedure: LAPAROSCOPIC ASSISTED VAGINAL HYSTERECTOMY;  Surgeon: Linda Hedges, DO;  Location: [facility];  Service: Gynecology;  Laterality: N/A;  pull Dr Gregor Hams instrument  ? LEFT HEART CATH AND CORONARY ANGIOGRAPHY N/A 02/11/2017  ? Procedure: Left Heart Cath and Coronary Angiography;  Surgeon: Lorretta Harp, MD;  Location: [facility];  Service: Cardiovascular;  Laterality: N/A;  ? TUBAL LIGATION Bilateral 1992  ? ? ? ?Current Outpatient Medications  ?Medication Sig Dispense Refill  ? atorvastatin (LIPITOR) 80 MG tablet Take 1 tablet (80 mg total) by mouth daily. 90 tablet 3  ? Continuous  Blood Gluc Sensor (DEXCOM G6 SENSOR) MISC INJECT 1 SENSOR INTO SKIN EVERY 10 DAYS 3 each 11  ? Continuous Blood Gluc Transmit (DEXCOM G6 TRANSMITTER) MISC USE AS DIRECTED FOR CONTINUOUS GLUCOSE MONITORING. REUSE TRANSMITTER X 90DAYS THEN DISCARD & REPLACE 1 each 3  ? insulin lispro (HUMALOG KWIKPEN) 100 UNIT/ML KwikPen INJECT INTO SKIN 3 TIMES A DAY AT MEAL TIME ACCORDING TO THE FOLLOWING SLIDING SCALE: 27 UNITS MAX PER DAY DX E11.43 15 mL 5  ? Insulin Pen Needle (PEN NEEDLES) 31G X 8 MM MISC Use to inject insulin 4 times a day.  Dx: E11.10 130 each 11  ? Lancets (ONETOUCH ULTRASOFT) lancets Use to check blood sugar daily as needed.  Dx: E11.10 100 each 3  ? levothyroxine (SYNTHROID) 175 MCG tablet Take 175 mcg by mouth daily before breakfast.    ? metoCLOPramide (REGLAN) 5 MG tablet Take '5mg'$  before breakfast lunch and dinner and '10mg'$  at bedtime 150 tablet 11  ? TOUJEO SOLOSTAR 300 UNIT/ML Solostar Pen Inject 18 Units into the skin at bedtime. 6 mL 3  ? Vitamin D, Ergocalciferol, (DRISDOL) 1.25 MG (50000 UNIT) CAPS capsule Take 1 capsule (50,000 Units total) by mouth every 7 (seven) days. (Patient taking differently: Take 50,000 Units by mouth every 7 (seven) days. Monday) 12 capsule 0  ? cyclobenzaprine (FLEXERIL) 10 MG tablet Take 10 mg by mouth at bedtime. (Patient not taking: Reported on 03/02/2022)    ? dicyclomine (BENTYL) 10 MG capsule TAKE 1 CAPSULE BY MOUTH TWICE A DAY AS NEEDED FOR SPASMS (Patient not taking: Reported on 03/02/2022) 60 capsule 2  ? gabapentin (NEURONTIN) 300 MG capsule Take 300 mg by mouth 3 (three) times daily as needed (pain). (Patient not taking: Reported on 03/02/2022)    ? midodrine (PROAMATINE) 5 MG tablet Take 1 tablet (5 mg total) by mouth 2 (two) times daily with a meal. 180 tablet 3  ? pantoprazole (PROTONIX) 20 MG tablet Take 1 tablet (20 mg total) by mouth 2 (two) times daily. (Patient not taking: Reported on 03/02/2022) 60 tablet 0  ? promethazine (PHENERGAN) 25 MG tablet Take 1  tablet (25 mg total) by mouth every 8 (eight) hours as needed for nausea or vomiting. (Patient not taking: Reported on 03/02/2022) 30 tablet 3  ? sucralfate (CARAFATE) 1 GM/10ML suspension Take 10 mLs (1 g total) by mouth 4 (four) times daily -  with meals and at bedtime. (Patient not taking: Reported on 03/02/2022) 420 mL 0  ? ?No current facility-administered medications for this visit.  ? ? ?Allergies:   Codeine  ? ? ?ROS:  Please see the history of present illness.   Otherwise, review of systems are positive for none.   All other systems are reviewed and negative.  ? ? ?PHYSICAL EXAM: ?VS:  BP 98/60   Pulse 81  Ht '4\' 11"'$  (1.499 m)   Wt 115 lb (52.2 kg)   LMP 08/11/2012   SpO2 97%   BMI 23.23 kg/m?  , BMI Body mass index is 23.23 kg/m?.  ?GENERAL:  Well appearing ?NECK:  No jugular venous distention, waveform within normal limits, carotid upstroke brisk and symmetric, no bruits, no thyromegaly ?LUNGS:  Clear to auscultation bilaterally ?CHEST:  Unremarkable ?HEART:  PMI not displaced or sustained,S1 and S2 within normal limits, no S3, no S4, no clicks, no rubs, no murmurs ?ABD:  Flat, positive bowel sounds normal in frequency in pitch, no bruits, no rebound, no guarding, no midline pulsatile mass, no hepatomegaly, no splenomegaly ?EXT:  2 plus pulses throughout, no edema, no cyanosis no clubbing ? ? ?EKG:  EKG is ordered today. ?Rhythm, rate 81, axis within normal limits, intervals within normal limits, no acute ST-T wave changes. ? ? ?Recent Labs: ?06/30/2021: B Natriuretic Peptide 14.9 ?01/05/2022: TSH 53.58 ?01/17/2022: Hemoglobin 15.7; Magnesium 1.8; Platelets 332 ?01/18/2022: ALT 13 ?01/19/2022: BUN 18; Creatinine, Ser 0.46; Potassium 3.6; Sodium 134  ? ? ?Lipid Panel ?   ?Component Value Date/Time  ? CHOL 269 (H) 01/05/2022 1629  ? TRIG 184 (H) 01/05/2022 1629  ? HDL 47 (L) 01/05/2022 1629  ? CHOLHDL 5.7 (H) 01/05/2022 1629  ? VLDL 29.6 01/27/2019 0809  ? LDLCALC 186 (H) 01/05/2022 1629  ? LDLDIRECT 206.0  07/27/2014 0745  ? ?  ? ?Wt Readings from Last 3 Encounters:  ?03/02/22 115 lb (52.2 kg)  ?01/17/22 113 lb (51.3 kg)  ?01/05/22 113 lb (51.3 kg)  ?  ? ? ?Other studies Reviewed: ?Additional studies/ records that w

## 2022-03-02 ENCOUNTER — Encounter: Payer: Self-pay | Admitting: Cardiology

## 2022-03-02 ENCOUNTER — Ambulatory Visit (INDEPENDENT_AMBULATORY_CARE_PROVIDER_SITE_OTHER): Payer: BC Managed Care – PPO | Admitting: Cardiology

## 2022-03-02 ENCOUNTER — Other Ambulatory Visit: Payer: Self-pay

## 2022-03-02 VITALS — BP 98/60 | HR 81 | Ht 59.0 in | Wt 115.0 lb

## 2022-03-02 DIAGNOSIS — I959 Hypotension, unspecified: Secondary | ICD-10-CM

## 2022-03-02 DIAGNOSIS — E785 Hyperlipidemia, unspecified: Secondary | ICD-10-CM

## 2022-03-02 DIAGNOSIS — R072 Precordial pain: Secondary | ICD-10-CM | POA: Diagnosis not present

## 2022-03-02 MED ORDER — MIDODRINE HCL 5 MG PO TABS: 5.0000 mg | ORAL_TABLET | Freq: Two times a day (BID) | ORAL | 3 refills | Status: DC

## 2022-03-02 NOTE — Patient Instructions (Addendum)
Medication Instructions:  ?Your physician recommends that you continue on your current medications as directed. Please refer to the Current Medication list given to you today. ? ?*If you need a refill on your cardiac medications before your next appointment, please call your pharmacy* ? ?Follow-Up: ?At [facility], you and your health needs are our priority.  As part of our continuing mission to provide you with exceptional heart care, we have created designated Provider Care Teams.  These Care Teams include your primary Cardiologist (physician) and Advanced Practice Providers (APPs -  Physician Assistants and Nurse Practitioners) who all work together to provide you with the care you need, when you need it. ? ?We recommend signing up for the patient portal called "MyChart".  Sign up information is provided on this After Visit Summary.  MyChart is used to connect with patients for Virtual Visits (Telemedicine).  Patients are able to view lab/test results, encounter notes, upcoming appointments, etc.  Non-urgent messages can be sent to your provider as well.   ?To learn more about what you can do with MyChart, go to [email or web address].   ? ?Your next appointment:   ?12 month(s) ? ?The format for your next appointment:   ?In Person ? ?Provider:   ?Dr. Percival Spanish or PA/NP { ? ? ?

## 2022-03-05 ENCOUNTER — Other Ambulatory Visit (INDEPENDENT_AMBULATORY_CARE_PROVIDER_SITE_OTHER): Payer: BC Managed Care – PPO

## 2022-03-05 DIAGNOSIS — E785 Hyperlipidemia, unspecified: Secondary | ICD-10-CM | POA: Diagnosis not present

## 2022-03-05 DIAGNOSIS — R072 Precordial pain: Secondary | ICD-10-CM

## 2022-03-14 DIAGNOSIS — E119 Type 2 diabetes mellitus without complications: Secondary | ICD-10-CM | POA: Diagnosis not present

## 2022-03-14 LAB — HM DIABETES EYE EXAM

## 2022-03-19 DIAGNOSIS — E1065 Type 1 diabetes mellitus with hyperglycemia: Secondary | ICD-10-CM | POA: Diagnosis not present

## 2022-03-19 DIAGNOSIS — Z79890 Hormone replacement therapy (postmenopausal): Secondary | ICD-10-CM | POA: Diagnosis not present

## 2022-03-19 DIAGNOSIS — E039 Hypothyroidism, unspecified: Secondary | ICD-10-CM | POA: Diagnosis not present

## 2022-03-27 ENCOUNTER — Observation Stay (HOSPITAL_COMMUNITY)
Admission: EM | Admit: 2022-03-27 | Discharge: 2022-03-29 | Disposition: A | Discharge status: Home / Self Care | Payer: BC Managed Care – PPO | Attending: Emergency Medicine | Admitting: Emergency Medicine

## 2022-03-27 ENCOUNTER — Emergency Department (HOSPITAL_COMMUNITY): Payer: BC Managed Care – PPO

## 2022-03-27 ENCOUNTER — Other Ambulatory Visit: Payer: Self-pay

## 2022-03-27 ENCOUNTER — Encounter (HOSPITAL_COMMUNITY): Payer: Self-pay

## 2022-03-27 DIAGNOSIS — E1165 Type 2 diabetes mellitus with hyperglycemia: Secondary | ICD-10-CM | POA: Diagnosis not present

## 2022-03-27 DIAGNOSIS — S91101A Unspecified open wound of right great toe without damage to nail, initial encounter: Secondary | ICD-10-CM | POA: Diagnosis not present

## 2022-03-27 DIAGNOSIS — E860 Dehydration: Secondary | ICD-10-CM

## 2022-03-27 DIAGNOSIS — Z794 Long term (current) use of insulin: Secondary | ICD-10-CM | POA: Insufficient documentation

## 2022-03-27 DIAGNOSIS — I509 Heart failure, unspecified: Secondary | ICD-10-CM | POA: Diagnosis not present

## 2022-03-27 DIAGNOSIS — Z7982 Long term (current) use of aspirin: Secondary | ICD-10-CM | POA: Diagnosis not present

## 2022-03-27 DIAGNOSIS — E785 Hyperlipidemia, unspecified: Secondary | ICD-10-CM | POA: Diagnosis present

## 2022-03-27 DIAGNOSIS — Z87891 Personal history of nicotine dependence: Secondary | ICD-10-CM | POA: Diagnosis not present

## 2022-03-27 DIAGNOSIS — R112 Nausea with vomiting, unspecified: Secondary | ICD-10-CM | POA: Diagnosis not present

## 2022-03-27 DIAGNOSIS — E039 Hypothyroidism, unspecified: Secondary | ICD-10-CM | POA: Diagnosis not present

## 2022-03-27 DIAGNOSIS — R000 Tachycardia, unspecified: Secondary | ICD-10-CM | POA: Diagnosis not present

## 2022-03-27 DIAGNOSIS — R109 Unspecified abdominal pain: Secondary | ICD-10-CM | POA: Diagnosis not present

## 2022-03-27 DIAGNOSIS — X58XXXA Exposure to other specified factors, initial encounter: Secondary | ICD-10-CM | POA: Diagnosis not present

## 2022-03-27 DIAGNOSIS — Z7901 Long term (current) use of anticoagulants: Secondary | ICD-10-CM | POA: Insufficient documentation

## 2022-03-27 DIAGNOSIS — K3184 Gastroparesis: Secondary | ICD-10-CM | POA: Diagnosis not present

## 2022-03-27 DIAGNOSIS — Z79899 Other long term (current) drug therapy: Secondary | ICD-10-CM | POA: Insufficient documentation

## 2022-03-27 DIAGNOSIS — F1210 Cannabis abuse, uncomplicated: Secondary | ICD-10-CM

## 2022-03-27 DIAGNOSIS — N182 Chronic kidney disease, stage 2 (mild): Secondary | ICD-10-CM | POA: Diagnosis not present

## 2022-03-27 DIAGNOSIS — E1043 Type 1 diabetes mellitus with diabetic autonomic (poly)neuropathy: Secondary | ICD-10-CM | POA: Diagnosis not present

## 2022-03-27 DIAGNOSIS — E1065 Type 1 diabetes mellitus with hyperglycemia: Secondary | ICD-10-CM | POA: Diagnosis not present

## 2022-03-27 DIAGNOSIS — R1084 Generalized abdominal pain: Secondary | ICD-10-CM | POA: Diagnosis not present

## 2022-03-27 DIAGNOSIS — R079 Chest pain, unspecified: Secondary | ICD-10-CM | POA: Diagnosis not present

## 2022-03-27 DIAGNOSIS — I130 Hypertensive heart and chronic kidney disease with heart failure and stage 1 through stage 4 chronic kidney disease, or unspecified chronic kidney disease: Secondary | ICD-10-CM | POA: Insufficient documentation

## 2022-03-27 DIAGNOSIS — E1049 Type 1 diabetes mellitus with other diabetic neurological complication: Secondary | ICD-10-CM | POA: Insufficient documentation

## 2022-03-27 DIAGNOSIS — R739 Hyperglycemia, unspecified: Secondary | ICD-10-CM | POA: Diagnosis not present

## 2022-03-27 DIAGNOSIS — I959 Hypotension, unspecified: Secondary | ICD-10-CM | POA: Diagnosis not present

## 2022-03-27 DIAGNOSIS — K219 Gastro-esophageal reflux disease without esophagitis: Secondary | ICD-10-CM | POA: Diagnosis present

## 2022-03-27 DIAGNOSIS — E1143 Type 2 diabetes mellitus with diabetic autonomic (poly)neuropathy: Secondary | ICD-10-CM | POA: Diagnosis present

## 2022-03-27 DIAGNOSIS — R0789 Other chest pain: Secondary | ICD-10-CM | POA: Diagnosis not present

## 2022-03-27 DIAGNOSIS — E1149 Type 2 diabetes mellitus with other diabetic neurological complication: Secondary | ICD-10-CM | POA: Diagnosis present

## 2022-03-27 LAB — TROPONIN I (HIGH SENSITIVITY)
Troponin I (High Sensitivity): 4 ng/L (ref <18)
Troponin I (High Sensitivity): 4 ng/L (ref <18)

## 2022-03-27 LAB — URINALYSIS, ROUTINE W REFLEX MICROSCOPIC
Bilirubin Urine: NEGATIVE
Glucose, UA: >=500 mg/dL — AB
Ketones, ur: 20 mg/dL — AB
Leukocytes,Ua: NEGATIVE
Nitrite: NEGATIVE
Protein, ur: NEGATIVE mg/dL
Specific Gravity, Urine: 1.018 (ref 1.005–1.030)
pH: 7 (ref 5.0–8.0)

## 2022-03-27 LAB — CBC WITH DIFFERENTIAL/PLATELET
Abs Immature Granulocytes: 0.08 10*3/uL — ABNORMAL HIGH (ref 0.00–0.07)
Abs Immature Granulocytes: 0.05 10*3/uL (ref 0.00–0.07)
Basophils Absolute: 0 10*3/uL (ref 0.0–0.1)
Basophils Absolute: 0.1 10*3/uL (ref 0.0–0.1)
Basophils Relative: 0 %
Basophils Relative: 0 %
Eosinophils Absolute: 0 10*3/uL (ref 0.0–0.5)
Eosinophils Absolute: 0.1 10*3/uL (ref 0.0–0.5)
Eosinophils Relative: 0 %
Eosinophils Relative: 1 %
HCT: 42.5 % (ref 36.0–46.0)
HCT: 48 % — ABNORMAL HIGH (ref 36.0–46.0)
Hemoglobin: 14.9 g/dL (ref 12.0–15.0)
Hemoglobin: 16.4 g/dL — ABNORMAL HIGH (ref 12.0–15.0)
Immature Granulocytes: 1 %
Immature Granulocytes: 0 %
Lymphocytes Relative: 8 %
Lymphocytes Relative: 25 %
Lymphs Abs: 1 10*3/uL (ref 0.7–4.0)
Lymphs Abs: 2.9 10*3/uL (ref 0.7–4.0)
MCH: 29.7 pg (ref 26.0–34.0)
MCH: 29.4 pg (ref 26.0–34.0)
MCHC: 35.1 g/dL (ref 30.0–36.0)
MCHC: 34.2 g/dL (ref 30.0–36.0)
MCV: 84.8 fL (ref 80.0–100.0)
MCV: 86.2 fL (ref 80.0–100.0)
Monocytes Absolute: 0.7 10*3/uL (ref 0.1–1.0)
Monocytes Absolute: 0.9 10*3/uL (ref 0.1–1.0)
Monocytes Relative: 6 %
Monocytes Relative: 8 %
Neutro Abs: 10.3 10*3/uL — ABNORMAL HIGH (ref 1.7–7.7)
Neutro Abs: 7.6 10*3/uL (ref 1.7–7.7)
Neutrophils Relative %: 85 %
Neutrophils Relative %: 66 %
Platelets: 335 10*3/uL (ref 150–400)
Platelets: 313 10*3/uL (ref 150–400)
RBC: 5.01 MIL/uL (ref 3.87–5.11)
RBC: 5.57 MIL/uL — ABNORMAL HIGH (ref 3.87–5.11)
RDW: 13.9 % (ref 11.5–15.5)
RDW: 14 % (ref 11.5–15.5)
WBC: 12.1 10*3/uL — ABNORMAL HIGH (ref 4.0–10.5)
WBC: 11.6 10*3/uL — ABNORMAL HIGH (ref 4.0–10.5)
nRBC: 0 % (ref 0.0–0.2)
nRBC: 0 % (ref 0.0–0.2)

## 2022-03-27 LAB — BASIC METABOLIC PANEL
Anion gap: 10 (ref 5–15)
BUN: 15 mg/dL (ref 6–20)
CO2: 24 mmol/L (ref 22–32)
Calcium: 8.3 mg/dL — ABNORMAL LOW (ref 8.9–10.3)
Chloride: 102 mmol/L (ref 98–111)
Creatinine, Ser: 0.49 mg/dL (ref 0.44–1.00)
GFR, Estimated: >60 mL/min (ref >60)
Glucose, Bld: 126 mg/dL — ABNORMAL HIGH (ref 70–99)
Potassium: 3.7 mmol/L (ref 3.5–5.1)
Sodium: 136 mmol/L (ref 135–145)

## 2022-03-27 LAB — RAPID URINE DRUG SCREEN, HOSP PERFORMED
Amphetamines: NOT DETECTED
Barbiturates: NOT DETECTED
Benzodiazepines: NOT DETECTED
Cocaine: NOT DETECTED
Opiates: NOT DETECTED
Tetrahydrocannabinol: POSITIVE — AB

## 2022-03-27 LAB — COMPREHENSIVE METABOLIC PANEL
ALT: 14 U/L (ref 0–44)
AST: 13 U/L — ABNORMAL LOW (ref 15–41)
Albumin: 3.9 g/dL (ref 3.5–5.0)
Alkaline Phosphatase: 68 U/L (ref 38–126)
Anion gap: 11 (ref 5–15)
BUN: 28 mg/dL — ABNORMAL HIGH (ref 6–20)
CO2: 31 mmol/L (ref 22–32)
Calcium: 9 mg/dL (ref 8.9–10.3)
Chloride: 92 mmol/L — ABNORMAL LOW (ref 98–111)
Creatinine, Ser: 0.73 mg/dL (ref 0.44–1.00)
GFR, Estimated: >60 mL/min (ref >60)
Glucose, Bld: 488 mg/dL — ABNORMAL HIGH (ref 70–99)
Potassium: 2.9 mmol/L — ABNORMAL LOW (ref 3.5–5.1)
Sodium: 134 mmol/L — ABNORMAL LOW (ref 135–145)
Total Bilirubin: 0.8 mg/dL (ref 0.3–1.2)
Total Protein: 7.2 g/dL (ref 6.5–8.1)

## 2022-03-27 LAB — BLOOD GAS, VENOUS
Acid-Base Excess: 9.4 mmol/L — ABNORMAL HIGH (ref 0.0–2.0)
Bicarbonate: 34.8 mmol/L — ABNORMAL HIGH (ref 20.0–28.0)
O2 Saturation: 90.7 %
Patient temperature: 37
pCO2, Ven: 49 mmHg (ref 44–60)
pH, Ven: 7.46 — ABNORMAL HIGH (ref 7.25–7.43)
pO2, Ven: 58 mmHg — ABNORMAL HIGH (ref 32–45)

## 2022-03-27 LAB — HEMOGLOBIN A1C
Hgb A1c MFr Bld: 11.4 % — ABNORMAL HIGH (ref 4.8–5.6)
Mean Plasma Glucose: 280.48 mg/dL

## 2022-03-27 LAB — LIPASE, BLOOD: Lipase: 64 U/L — ABNORMAL HIGH (ref 11–51)

## 2022-03-27 LAB — CBG MONITORING, ED
Glucose-Capillary: 497 mg/dL — ABNORMAL HIGH (ref 70–99)
Glucose-Capillary: 283 mg/dL — ABNORMAL HIGH (ref 70–99)
Glucose-Capillary: 120 mg/dL — ABNORMAL HIGH (ref 70–99)
Glucose-Capillary: 175 mg/dL — ABNORMAL HIGH (ref 70–99)

## 2022-03-27 LAB — ETHANOL: Alcohol, Ethyl (B): <10 mg/dL (ref <10)

## 2022-03-27 LAB — GLUCOSE, CAPILLARY: Glucose-Capillary: 106 mg/dL — ABNORMAL HIGH (ref 70–99)

## 2022-03-27 MED ORDER — SODIUM CHLORIDE 0.9 % IV SOLN
25.0000 mg | Freq: Four times a day (QID) | INTRAVENOUS | Status: DC | PRN
  Administered 2022-03-27: 25 mg via INTRAVENOUS
  Filled 2022-03-27: qty 25

## 2022-03-27 MED ORDER — METOCLOPRAMIDE HCL 5 MG/ML IJ SOLN
5.0000 mg | Freq: Once | INTRAMUSCULAR | Status: AC
  Administered 2022-03-27: 5 mg via INTRAVENOUS
  Filled 2022-03-27: qty 2

## 2022-03-27 MED ORDER — KETOROLAC TROMETHAMINE 30 MG/ML IJ SOLN
30.0000 mg | Freq: Once | INTRAMUSCULAR | Status: AC
  Administered 2022-03-27: 30 mg via INTRAVENOUS
  Filled 2022-03-27: qty 1

## 2022-03-27 MED ORDER — POTASSIUM CHLORIDE 10 MEQ/100ML IV SOLN
10.0000 meq | INTRAVENOUS | Status: AC
  Administered 2022-03-27 (×3): 10 meq via INTRAVENOUS
  Filled 2022-03-27 (×3): qty 100

## 2022-03-27 MED ORDER — POTASSIUM CHLORIDE CRYS ER 20 MEQ PO TBCR
40.0000 meq | EXTENDED_RELEASE_TABLET | Freq: Once | ORAL | Status: DC
  Filled 2022-03-27 (×2): qty 2

## 2022-03-27 MED ORDER — PROCHLORPERAZINE EDISYLATE 10 MG/2ML IJ SOLN
10.0000 mg | Freq: Four times a day (QID) | INTRAMUSCULAR | Status: DC | PRN
  Administered 2022-03-27: 10 mg via INTRAVENOUS
  Filled 2022-03-27: qty 2

## 2022-03-27 MED ORDER — ACETAMINOPHEN 325 MG PO TABS: 650.0000 mg | ORAL_TABLET | Freq: Once | ORAL | Status: DC

## 2022-03-27 MED ORDER — SODIUM CHLORIDE 0.9 % IV SOLN: INTRAVENOUS | Status: DC

## 2022-03-27 MED ORDER — LIDOCAINE VISCOUS HCL 2 % MT SOLN
15.0000 mL | Freq: Once | OROMUCOSAL | Status: AC
  Administered 2022-03-27: 15 mL via ORAL
  Filled 2022-03-27: qty 15

## 2022-03-27 MED ORDER — INSULIN ASPART 100 UNIT/ML IJ SOLN
0.0000 [IU] | INTRAMUSCULAR | Status: DC
  Filled 2022-03-27: qty 0.15

## 2022-03-27 MED ORDER — INSULIN GLARGINE-YFGN 100 UNIT/ML ~~LOC~~ SOLN
6.0000 [IU] | Freq: Once | SUBCUTANEOUS | Status: AC
  Administered 2022-03-27: 6 [IU] via SUBCUTANEOUS
  Filled 2022-03-27: qty 0.06

## 2022-03-27 MED ORDER — SODIUM CHLORIDE 0.9 % IV BOLUS
1000.0000 mL | Freq: Once | INTRAVENOUS | Status: AC
  Administered 2022-03-27: 1000 mL via INTRAVENOUS

## 2022-03-27 MED ORDER — ALUM & MAG HYDROXIDE-SIMETH 200-200-20 MG/5ML PO SUSP
30.0000 mL | Freq: Once | ORAL | Status: AC
  Administered 2022-03-27: 30 mL via ORAL
  Filled 2022-03-27: qty 30

## 2022-03-27 MED ORDER — INSULIN ASPART 100 UNIT/ML IJ SOLN
8.0000 [IU] | Freq: Once | INTRAMUSCULAR | Status: DC
  Filled 2022-03-27: qty 0.08

## 2022-03-27 MED ORDER — SUCRALFATE 1 GM/10ML PO SUSP: 1.0000 g | Freq: Three times a day (TID) | ORAL | 0 refills | Status: DC

## 2022-03-27 MED ORDER — INSULIN ASPART 100 UNIT/ML IJ SOLN
0.0000 [IU] | Freq: Three times a day (TID) | INTRAMUSCULAR | Status: DC
  Administered 2022-03-27: 3 [IU] via SUBCUTANEOUS
  Filled 2022-03-27: qty 0.15

## 2022-03-27 MED ORDER — METOCLOPRAMIDE HCL 10 MG PO TABS: 10.0000 mg | ORAL_TABLET | Freq: Four times a day (QID) | ORAL | 0 refills | Status: DC

## 2022-03-27 MED ORDER — INSULIN ASPART 100 UNIT/ML IJ SOLN
5.0000 [IU] | Freq: Once | INTRAMUSCULAR | Status: AC
  Administered 2022-03-27: 5 [IU] via SUBCUTANEOUS
  Filled 2022-03-27: qty 0.05

## 2022-03-27 MED ORDER — PANTOPRAZOLE SODIUM 40 MG PO TBEC
40.0000 mg | DELAYED_RELEASE_TABLET | Freq: Every day | ORAL | Status: DC
  Administered 2022-03-28 – 2022-03-29 (×2): 40 mg via ORAL
  Filled 2022-03-27 (×3): qty 1

## 2022-03-27 NOTE — ED Provider Notes (Addendum)
?[facility] DEPT ?Provider Note ? ? ?CSN: [id number] ?Arrival date & time: 03/27/22  0531 ? ?  ? ?History ? ?Chief Complaint  ?Patient presents with  ? Hyperglycemia  ? ? ?[name] [name] is a 61 y.o. female. ? ?HPI ?61 y/o F with PMH of uncontrolled Type I DM with gastroparesis, CHF, prior NSTEMI, A-Fib, CKD, and PUD presents with nausea and vomiting for 3 days. Patient states vomiting episodes began out of nowhere on Saturday and has had episodes of nausea and vomiting roughly every hour since then. She has non been able to retain fluids or food; patient states she attempted to eat soup yesterday and immediately threw it back up. Additionally, Patient states she had one episode of nonbloody diarrhea yesterday, but has no had another episode since. Patient elicits associated headache, sharp nonradiating left sided chest pain worse with exertion and relieved with rest, diffuse constant abdominal pain, and increased urinary frequency. Denies vision changes, hearing changes, ear pain, rhinorrhea, sore throat, SHOB, cough, hematemesis, numbness, tingling, weakness, lightheadedness, fever, chills, diaphoresis. Denies recent illness. Denies known ingestion of contaminated foods. Denies sick contacts. Denies recent injuries. ?  ? ?Home Medications ?Prior to Admission medications   ?Medication Sig Start Date End Date Taking? Authorizing Provider  ?metoCLOPramide (REGLAN) 10 MG tablet Take 1 tablet (10 mg total) by mouth every 6 (six) hours. 03/27/22  Yes Garald Balding, PA-C  ?atorvastatin (LIPITOR) 80 MG tablet Take 1 tablet (80 mg total) by mouth daily. 01/12/22   Bedsole, Amy E, MD  ?Continuous Blood Gluc Sensor (DEXCOM G6 SENSOR) MISC INJECT 1 SENSOR INTO SKIN EVERY 10 DAYS 07/05/21   Bedsole, Amy E, MD  ?Continuous Blood Gluc Transmit (DEXCOM G6 TRANSMITTER) MISC USE AS DIRECTED FOR CONTINUOUS GLUCOSE MONITORING. REUSE TRANSMITTER X 90DAYS THEN DISCARD & REPLACE 07/05/21   Bedsole, Amy E, MD   ?insulin lispro (HUMALOG KWIKPEN) 100 UNIT/ML KwikPen INJECT INTO SKIN 3 TIMES A DAY AT MEAL TIME ACCORDING TO THE FOLLOWING SLIDING SCALE: 27 UNITS MAX PER DAY DX E11.43 ?Patient taking differently: Inject 0-9 Units into the skin with breakfast, with lunch, and with evening meal. Per sliding scale. 02/26/22   Bedsole, Amy E, MD  ?Insulin Pen Needle (PEN NEEDLES) 31G X 8 MM MISC Use to inject insulin 4 times a day.  Dx: E11.10 07/21/21   Jinny Sanders, MD  ?Lancets (ONETOUCH ULTRASOFT) lancets Use to check blood sugar daily as needed.  Dx: E11.10 12/23/17   Jinny Sanders, MD  ?levothyroxine (SYNTHROID) 137 MCG tablet Take 959 mcg by mouth every Sunday. 03/19/22   [provider]  ?midodrine (PROAMATINE) 5 MG tablet Take 1 tablet (5 mg total) by mouth 2 (two) times daily with a meal. 03/02/22   Minus Breeding, MD  ?pantoprazole (PROTONIX) 20 MG tablet Take 1 tablet (20 mg total) by mouth 2 (two) times daily. ?Patient not taking: Reported on 03/02/2022 06/30/21 03/02/22  Levin Bacon, MD  ?promethazine (PHENERGAN) 25 MG tablet Take 1 tablet (25 mg total) by mouth every 8 (eight) hours as needed for nausea or vomiting. ?Patient not taking: Reported on 03/02/2022 01/05/22   Jinny Sanders, MD  ?sucralfate (CARAFATE) 1 GM/10ML suspension Take 10 mLs (1 g total) by mouth 4 (four) times daily -  with meals and at bedtime. 03/27/22   Garald Balding, PA-C  ?TOUJEO SOLOSTAR 300 UNIT/ML Solostar Pen Inject 18 Units into the skin at bedtime. ?Patient taking differently: Inject 6 Units into the skin  at bedtime. 07/21/21   Bedsole, Amy E, MD  ?Vitamin D, Ergocalciferol, (DRISDOL) 1.25 MG (50000 UNIT) CAPS capsule Take 1 capsule (50,000 Units total) by mouth every 7 (seven) days. ?Patient taking differently: Take 50,000 Units by mouth every Monday. 01/12/22   Jinny Sanders, MD  ?   ? ?Allergies    ?Codeine   ? ?Review of Systems   ?Review of Systems ?Ten systems reviewed and are negative for acute change, except as noted  in the HPI.  ? ?Physical Exam ?Updated Vital Signs ?BP (!) 139/109   Pulse (!) 104   Temp 98.2 ?F (36.8 ?C) (Oral)   Resp 13   Ht '4\' 11"'$  (1.499 m)   Wt 52.6 kg   LMP 08/11/2012   SpO2 98%   BMI 23.43 kg/m?  ?Physical Exam ?Vitals and nursing note reviewed.  ?Constitutional:   ?   General: She is not in acute distress. ?   Appearance: She is well-developed.  ?HENT:  ?   Head: Normocephalic and atraumatic.  ?Eyes:  ?   Conjunctiva/sclera: Conjunctivae normal.  ?Cardiovascular:  ?   Rate and Rhythm: Normal rate and regular rhythm.  ?   Heart sounds: No murmur heard. ?Pulmonary:  ?   Effort: Pulmonary effort is normal. No respiratory distress.  ?   Breath sounds: Normal breath sounds.  ?Abdominal:  ?   Palpations: Abdomen is soft.  ?   Tenderness: There is no abdominal tenderness.  ?   Comments: Abdomen is mildly distended but soft, with very mild generalized tenderness.  No focal tenderness.  No CVA tenderness.  ?Musculoskeletal:     ?   General: No swelling.  ?   Cervical back: Neck supple.  ?Skin: ?   General: Skin is warm and dry.  ?   Capillary Refill: Capillary refill takes less than 2 seconds.  ?Neurological:  ?   General: No focal deficit present.  ?   Mental Status: She is alert and oriented to person, place, and time.  ?Psychiatric:     ?   Mood and Affect: Mood normal.  ? ? ?ED Results / Procedures / Treatments   ?Labs ?(all labs ordered are listed, but only abnormal results are displayed) ?Labs Reviewed  ?CBC WITH DIFFERENTIAL/PLATELET - Abnormal; Notable for the following components:  ?    Result Value  ? WBC 12.1 (*)   ? Neutro Abs 10.3 (*)   ? Abs Immature Granulocytes 0.08 (*)   ? All other components within normal limits  ?URINALYSIS, ROUTINE W REFLEX MICROSCOPIC - Abnormal; Notable for the following components:  ? Color, Urine STRAW (*)   ? Glucose, UA >=500 (*)   ? Hgb urine dipstick SMALL (*)   ? Ketones, ur 20 (*)   ? Bacteria, UA MANY (*)   ? All other components within normal limits   ?BLOOD GAS, VENOUS - Abnormal; Notable for the following components:  ? pH, Ven 7.46 (*)   ? pO2, Ven 58 (*)   ? Bicarbonate 34.8 (*)   ? Acid-Base Excess 9.4 (*)   ? All other components within normal limits  ?COMPREHENSIVE METABOLIC PANEL - Abnormal; Notable for the following components:  ? Sodium 134 (*)   ? Potassium 2.9 (*)   ? Chloride 92 (*)   ? Glucose, Bld 488 (*)   ? BUN 28 (*)   ? AST 13 (*)   ? All other components within normal limits  ?LIPASE, BLOOD - Abnormal; Notable for the  following components:  ? Lipase 64 (*)   ? All other components within normal limits  ?RAPID URINE DRUG SCREEN, HOSP PERFORMED - Abnormal; Notable for the following components:  ? Tetrahydrocannabinol POSITIVE (*)   ? All other components within normal limits  ?CBG MONITORING, ED - Abnormal; Notable for the following components:  ? Glucose-Capillary 497 (*)   ? All other components within normal limits  ?CBG MONITORING, ED - Abnormal; Notable for the following components:  ? Glucose-Capillary 283 (*)   ? All other components within normal limits  ?ETHANOL  ?TROPONIN I (HIGH SENSITIVITY)  ?TROPONIN I (HIGH SENSITIVITY)  ? ? ?EKG ?None ? ?Radiology ?DG Chest Portable 1 View ? ?Result Date: 03/27/2022 ?CLINICAL DATA:  Chest pain EXAM: PORTABLE CHEST 1 VIEW COMPARISON:  06/30/2021 FINDINGS: The heart size and mediastinal contours are within normal limits. Both lungs are clear. The visualized skeletal structures are unremarkable. IMPRESSION: No active disease. Electronically Signed   By: Elmer Picker M.D.   On: 03/27/2022 11:48   ? ?Procedures ?Procedures  ? ? ?Medications Ordered in ED ?Medications  ?potassium chloride SA (KLOR-CON M) CR tablet 40 mEq (40 mEq Oral Not Given 03/27/22 0737)  ?acetaminophen (TYLENOL) tablet 650 mg (650 mg Oral Not Given 03/27/22 0745)  ?promethazine (PHENERGAN) 25 mg in sodium chloride 0.9 % 50 mL IVPB (0 mg Intravenous Stopped 03/27/22 1216)  ?sodium chloride 0.9 % bolus 1,000 mL (0 mLs Intravenous  Stopped 03/27/22 1126)  ?potassium chloride 10 mEq in 100 mL IVPB (0 mEq Intravenous Stopped 03/27/22 1126)  ?insulin aspart (novoLOG) injection 5 Units (5 Units Subcutaneous Given 03/27/22 0741)  ?sodium chloride 0.9

## 2022-03-27 NOTE — Discharge Instructions (Addendum)
You were evaluated in the Emergency Department and after careful evaluation, we did not find any emergent condition requiring admission or further testing in the hospital. ? ?Your work-up today was overall reassuring.  Your potassium was low and we have replaced this.  Your blood sugar was elevated today, we have given you IV fluids and short and long-acting insulin.  I recommend you closely follow-up with your endocrinologist for reevaluation of your insulin needs.  I will send a prescription for Reglan for nausea and Carafate for your peptic ulcer disease.  Please make sure to follow-up with gastroenterology.  I also recommend that you follow-up with cardiology given your ongoing chest pain but your chest pain work-up here today was negative. ? ?Please return to the Emergency Department if you experience any worsening of your condition.  We encourage you to follow up with a primary care provider.  Thank you for allowing [place] to be a part of your care. ? ?WOUND CARE: ?Cleanse first right webspace/plantar surface of the right foot with saline, cut to fit strips of silver hydrofiber and place in/on wound, it has a tiny area of depth. Cover with gauze and secure with conform gauze. Change daily. Teach patient to perform for DC to home. ?

## 2022-03-27 NOTE — ED Triage Notes (Signed)
Pt. BIB [facility] c/o hyperglycemia associated with abdominal pain x2 days. Pt.'s home glucometer read "high". CBG with EMS was 404. Pt. C/o nausea that was unrelieved by '4mg'$  zofran that was given by EMS. Given 579m NS en route. ? ?Ems vs: ?BP: 166/96 ?HR:110 ?O2 98% RA ?

## 2022-03-27 NOTE — H&P (Signed)
?History and Physical  ? ? ?Patient: [name] [name] DOB: December 11, 1960 ?DOA: 03/27/2022 ?DOS: the patient was seen and examined on 03/27/2022 ?PCP: Jinny Sanders, MD  ?Patient coming from: Home ? ?Chief Complaint:  ?Chief Complaint  ?Patient presents with  ? Hyperglycemia  ? ?HPI: [name] [name] is a 61 y.o. female with medical history significant of HTN, HLD, DM2, marijuana abuse, gastroparesis. Presenting with stomach pain, N/V. Started 3 days ago. She tried pepto bismol. She initially felt a little better, bu the next day her N/V/pain returned. Yesterday the symptoms intensified, so she decided to come to the ED for assistance. She denies any sick contacts, diarrhea. She reports compliance with her medications. She denies any other aggravating or alleviating factors.   ? ?Review of Systems: As mentioned in the history of present illness. All other systems reviewed and are negative. ?Past Medical History:  ?Diagnosis Date  ? Allergy   ? seasonal  ? Anxiety   ? B12 deficiency   ? Chest pain 04/05/2017  ? CHF (congestive heart failure) ([place])   ? Diabetes mellitus type 1, uncontrolled   ? "dr. just changed me to type 1, uncontrolled" (11/26/2018)  ? DKA (diabetic ketoacidoses) 05/25/2018  ? GERD (gastroesophageal reflux disease)   ? Hyperlipidemia   ? Hypertension   ? Hypothyroidism   ? IBS (irritable bowel syndrome)   ? Intermittent vertigo   ? 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 Leg cramping   ? "@ night" (09/18/2013)  ? Migraine   ? "once q couple months" (11/26/2018)  ? Osteoporosis   ? Peripheral neuropathy   ? Umbilical hernia   ? unrepaired (09/18/2013)  ? ?Past Surgical History:  ?Procedure Laterality Date  ? BIOPSY  06/03/2019  ? Procedure: BIOPSY;  Surgeon: Jackquline Denmark, MD;  Location: [facility];  Service: Endoscopy;;  ? BIOPSY  08/17/2020  ? Procedure: BIOPSY;  Surgeon: Thornton Park, MD;  Location: [facility];  Service: Gastroenterology;;  ? Gypsy  ? ENTEROSCOPY N/A 06/03/2019  ? Procedure: ENTEROSCOPY;  Surgeon: Jackquline Denmark, MD;  Location: [facility];  Service: Endoscopy;  Laterality: N/A;  ? ESOPHAGOGASTRODUODENOSCOPY (EGD) WITH PROPOFOL N/A 08/17/2020  ? Procedure: ESOPHAGOGASTRODUODENOSCOPY (EGD) WITH PROPOFOL;  Surgeon: Thornton Park, MD;  Location: [facility];  Service: Gastroenterology;  Laterality: N/A;  ? LAPAROSCOPIC ASSISTED VAGINAL HYSTERECTOMY  09/23/2012  ? Procedure: LAPAROSCOPIC ASSISTED VAGINAL HYSTERECTOMY;  Surgeon: Linda Hedges, DO;  Location: [facility];  Service: Gynecology;  Laterality: N/A;  pull Dr Gregor Hams instrument  ? LEFT HEART CATH AND CORONARY ANGIOGRAPHY N/A 02/11/2017  ? Procedure: Left Heart Cath and Coronary Angiography;  Surgeon: Lorretta Harp, MD;  Location: [facility];  Service: Cardiovascular;  Laterality: N/A;  ? TUBAL LIGATION Bilateral 1992  ? ?Social History:  reports that she quit smoking about 31 years ago. Her smoking use included cigarettes. She has a 1.20 pack-year smoking history. She has never used smokeless tobacco. She reports current drug use. Drug: Marijuana. She reports that she does not drink alcohol. ? ?Allergies  ?Allergen Reactions  ? Codeine Hives, Anxiety and Other (See Comments)  ? ? ?Family History  ?Problem Relation Age of Onset  ? Diabetes Other   ? Heart disease Other   ? Heart failure Mother 22  ?     Her heart skipped a beat and stopped - per pt  ? Diabetes Maternal Grandmother   ? Alzheimer's disease Maternal Grandmother   ?  Coronary artery disease Maternal Grandmother   ? Heart attack Maternal Grandfather   ? Heart failure Brother   ? Diabetes Brother   ? Colon polyps Neg Hx   ? Esophageal cancer Neg Hx   ? Liver cancer Neg Hx   ? Pancreatic cancer Neg Hx   ? Stomach cancer Neg Hx   ? Crohn's disease Neg Hx   ? Rectal cancer Neg Hx   ? ? ?Prior to Admission medications   ?Medication Sig Start Date End Date Taking? Authorizing Provider  ?metoCLOPramide (REGLAN) 10 MG tablet  Take 1 tablet (10 mg total) by mouth every 6 (six) hours. 03/27/22  Yes Garald Balding, PA-C  ?atorvastatin (LIPITOR) 80 MG tablet Take 1 tablet (80 mg total) by mouth daily. 01/12/22   Bedsole, Amy E, MD  ?Continuous Blood Gluc Sensor (DEXCOM G6 SENSOR) MISC INJECT 1 SENSOR INTO SKIN EVERY 10 DAYS 07/05/21   Bedsole, Amy E, MD  ?Continuous Blood Gluc Transmit (DEXCOM G6 TRANSMITTER) MISC USE AS DIRECTED FOR CONTINUOUS GLUCOSE MONITORING. REUSE TRANSMITTER X 90DAYS THEN DISCARD & REPLACE 07/05/21   Bedsole, Amy E, MD  ?insulin lispro (HUMALOG KWIKPEN) 100 UNIT/ML KwikPen INJECT INTO SKIN 3 TIMES A DAY AT MEAL TIME ACCORDING TO THE FOLLOWING SLIDING SCALE: 27 UNITS MAX PER DAY DX E11.43 ?Patient taking differently: Inject 0-9 Units into the skin with breakfast, with lunch, and with evening meal. Per sliding scale. 02/26/22   Bedsole, Amy E, MD  ?Insulin Pen Needle (PEN NEEDLES) 31G X 8 MM MISC Use to inject insulin 4 times a day.  Dx: E11.10 07/21/21   Jinny Sanders, MD  ?Lancets (ONETOUCH ULTRASOFT) lancets Use to check blood sugar daily as needed.  Dx: E11.10 12/23/17   Jinny Sanders, MD  ?levothyroxine (SYNTHROID) 137 MCG tablet Take 959 mcg by mouth every Sunday. 03/19/22   [provider]  ?midodrine (PROAMATINE) 5 MG tablet Take 1 tablet (5 mg total) by mouth 2 (two) times daily with a meal. 03/02/22   Minus Breeding, MD  ?pantoprazole (PROTONIX) 20 MG tablet Take 1 tablet (20 mg total) by mouth 2 (two) times daily. ?Patient not taking: Reported on 03/02/2022 06/30/21 03/02/22  Levin Bacon, MD  ?promethazine (PHENERGAN) 25 MG tablet Take 1 tablet (25 mg total) by mouth every 8 (eight) hours as needed for nausea or vomiting. ?Patient not taking: Reported on 03/02/2022 01/05/22   Jinny Sanders, MD  ?sucralfate (CARAFATE) 1 GM/10ML suspension Take 10 mLs (1 g total) by mouth 4 (four) times daily -  with meals and at bedtime. 03/27/22   Garald Balding, PA-C  ?TOUJEO SOLOSTAR 300 UNIT/ML Solostar Pen Inject  18 Units into the skin at bedtime. ?Patient taking differently: Inject 6 Units into the skin at bedtime. 07/21/21   Bedsole, Amy E, MD  ?Vitamin D, Ergocalciferol, (DRISDOL) 1.25 MG (50000 UNIT) CAPS capsule Take 1 capsule (50,000 Units total) by mouth every 7 (seven) days. ?Patient taking differently: Take 50,000 Units by mouth every Monday. 01/12/22   Jinny Sanders, MD  ? ? ?Physical Exam: ?Vitals:  ? 03/27/22 0930 03/27/22 1036 03/27/22 1130 03/27/22 1200  ?BP: 127/89 (!) 125/96 (!) 139/103 (!) 139/109  ?Pulse: (!) 101 95 93 (!) 104  ?Resp: 17 (!) '22 12 13  '$ ?Temp:    98.2 ?F (36.8 ?C)  ?TempSrc:    Oral  ?SpO2: 96% 100% 100% 98%  ?Weight:      ?Height:      ? ?General: 60  y.o. female resting in bed in NAD ?Eyes: PERRL, normal sclera ?ENMT: Nares patent w/o discharge, orophaynx clear, dentition normal, ears w/o discharge/lesions/ulcers ?Neck: Supple, trachea midline ?Cardiovascular: RRR, +S1, S2, no m/g/r, equal pulses throughout ?Respiratory: CTABL, no w/r/r, normal WOB ?GI: BS+, ND, soft, mild global TTP, no masses noted, no organomegaly noted ?MSK: No e/c/c ?Neuro: A&O x 3, no focal deficits ?Psyc: Appropriate interaction and affect, calm/cooperative ? ?Data Reviewed: ? ?Na+  134 ?K+  2.9 ?Cl- 92 ?CO2 31 ?Glucose 488 ?Lipase  64 ?WBC 12 ?Trp 4 -> 4 ? ?CXR: No active disease. ?EKG sinus tach, no st elevations ? ?Assessment and Plan: ?No notes have been filed under this hospital service. ?Service: Hospitalist ?Intractable N/V ?Diabetic gastroparesis ?    - place in obs, tele ?    - compazine, phenergan, fluids ?    - DM control, see below ?    - avoid narcotics ?    - counseled against mj use ?    - NPO except sip/meds/ice chip; advance diet as tolerated ? ?DM t2 uncontrolled ?    - resume home regimen when confirmed ?    - check A1c ? ?Marijuana abuse ?    - counseled against mj use ? ?Hypokalemia ?    - K+ has been replaced; check Mg2+ ? ?Dehydration ?    - fluids ? ?HLD ?    - continue home regimen when  confirmed ? ?Hypothyroidism ?    - continue home regimen when confirmed ? ?GERD ?    - PPI ? ?Advance Care Planning:   Code Status: FULL ? ?Consults: None ? ?Family Communication: w/ husband at bedside ? ?Se

## 2022-03-27 NOTE — ED Notes (Signed)
Given water and crackers, encouraged to eat and drink.  ?

## 2022-03-27 NOTE — Progress Notes (Addendum)
Inpatient Diabetes Program Recommendations ? ?AACE/ADA: New Consensus Statement on Inpatient Glycemic Control (2015) ? ?Target Ranges:  Prepandial:   less than 140 mg/dL ?     Peak postprandial:   less than 180 mg/dL (1-2 hours) ?     Critically ill patients:  140 - 180 mg/dL  ? ?Lab Results  ?Component Value Date  ? GLUCAP 283 (H) 03/27/2022  ? HGBA1C 10.8 (H) 11/22/2021  ? ? ?Review of Glycemic Control ? Latest Reference Range & Units 03/27/22 05:38 03/27/22 08:36  ?Glucose-Capillary 70 - 99 mg/dL 497 (H) 283 (H)  ? ?Diabetes history: DM 2 ?Outpatient Diabetes medications:  ?Toujeo 18 units q HS ?Humalog 0-9 units tid with meals ?Dexcom G6 ?Current orders for Inpatient glycemic control:  ?None ?Inpatient Diabetes Program Recommendations:   ?Needs basal insulin if patient is admitted.  Will follow.  ? ?Thanks,  ?Adah Perl, RN, BC-ADM ?Inpatient Diabetes Coordinator ?Pager [phone]  (8a-5p) ? ?Addendum:  Per PA, patient states that her basal insulin was reduced by endocrinologist to Toujeo 6 units q HS due to hypoglycemia.  May consider adding Semglee 6 units this AM prior to d/c home if patient is not admitted.  ? ? ? ?

## 2022-03-27 NOTE — ED Notes (Signed)
Pt states feeling nauseous when drinking water  ?

## 2022-03-27 NOTE — ED Notes (Signed)
Patient requested to speak with PA regarding discharge because she did not feel comfortable going home at this time d/t nausea. PA aware.  ?

## 2022-03-28 DIAGNOSIS — E1065 Type 1 diabetes mellitus with hyperglycemia: Secondary | ICD-10-CM

## 2022-03-28 DIAGNOSIS — R112 Nausea with vomiting, unspecified: Secondary | ICD-10-CM | POA: Diagnosis not present

## 2022-03-28 LAB — GLUCOSE, CAPILLARY
Glucose-Capillary: 125 mg/dL — ABNORMAL HIGH (ref 70–99)
Glucose-Capillary: 254 mg/dL — ABNORMAL HIGH (ref 70–99)
Glucose-Capillary: 176 mg/dL — ABNORMAL HIGH (ref 70–99)
Glucose-Capillary: 198 mg/dL — ABNORMAL HIGH (ref 70–99)

## 2022-03-28 LAB — COMPREHENSIVE METABOLIC PANEL
ALT: 13 U/L (ref 0–44)
AST: 17 U/L (ref 15–41)
Albumin: 3.4 g/dL — ABNORMAL LOW (ref 3.5–5.0)
Alkaline Phosphatase: 46 U/L (ref 38–126)
Anion gap: 7 (ref 5–15)
BUN: 13 mg/dL (ref 6–20)
CO2: 22 mmol/L (ref 22–32)
Calcium: 7.8 mg/dL — ABNORMAL LOW (ref 8.9–10.3)
Chloride: 109 mmol/L (ref 98–111)
Creatinine, Ser: 0.51 mg/dL (ref 0.44–1.00)
GFR, Estimated: >60 mL/min (ref >60)
Glucose, Bld: 115 mg/dL — ABNORMAL HIGH (ref 70–99)
Potassium: 3.4 mmol/L — ABNORMAL LOW (ref 3.5–5.1)
Sodium: 138 mmol/L (ref 135–145)
Total Bilirubin: 0.8 mg/dL (ref 0.3–1.2)
Total Protein: 5.7 g/dL — ABNORMAL LOW (ref 6.5–8.1)

## 2022-03-28 LAB — CBC
HCT: 43.6 % (ref 36.0–46.0)
Hemoglobin: 14.4 g/dL (ref 12.0–15.0)
MCH: 29.1 pg (ref 26.0–34.0)
MCHC: 33 g/dL (ref 30.0–36.0)
MCV: 88.3 fL (ref 80.0–100.0)
Platelets: 306 10*3/uL (ref 150–400)
RBC: 4.94 MIL/uL (ref 3.87–5.11)
RDW: 14.1 % (ref 11.5–15.5)
WBC: 9.6 10*3/uL (ref 4.0–10.5)
nRBC: 0 % (ref 0.0–0.2)

## 2022-03-28 LAB — MAGNESIUM: Magnesium: 2.1 mg/dL (ref 1.7–2.4)

## 2022-03-28 LAB — HIV ANTIBODY (ROUTINE TESTING W REFLEX): HIV Screen 4th Generation wRfx: NONREACTIVE

## 2022-03-28 MED ORDER — INSULIN ASPART 100 UNIT/ML IJ SOLN
0.0000 [IU] | Freq: Three times a day (TID) | INTRAMUSCULAR | Status: DC
  Administered 2022-03-28: 1 [IU] via SUBCUTANEOUS
  Administered 2022-03-28: 3 [IU] via SUBCUTANEOUS
  Administered 2022-03-29: 4 [IU] via SUBCUTANEOUS
  Administered 2022-03-29: 1 [IU] via SUBCUTANEOUS

## 2022-03-28 MED ORDER — METOCLOPRAMIDE HCL 5 MG PO TABS
5.0000 mg | ORAL_TABLET | Freq: Three times a day (TID) | ORAL | Status: DC
  Administered 2022-03-28 – 2022-03-29 (×5): 5 mg via ORAL
  Filled 2022-03-28 (×5): qty 1

## 2022-03-28 MED ORDER — POTASSIUM CHLORIDE 20 MEQ PO PACK
40.0000 meq | PACK | Freq: Once | ORAL | Status: AC
  Administered 2022-03-28: 40 meq via ORAL

## 2022-03-28 MED ORDER — LEVOTHYROXINE SODIUM 137 MCG PO TABS: 959.0000 ug | ORAL_TABLET | ORAL | Status: DC

## 2022-03-28 MED ORDER — ASPIRIN EC 81 MG PO TBEC
81.0000 mg | DELAYED_RELEASE_TABLET | Freq: Every day | ORAL | Status: DC
  Administered 2022-03-28 – 2022-03-29 (×2): 81 mg via ORAL
  Filled 2022-03-28 (×2): qty 1

## 2022-03-28 MED ORDER — ATORVASTATIN CALCIUM 40 MG PO TABS
80.0000 mg | ORAL_TABLET | Freq: Every day | ORAL | Status: DC
  Administered 2022-03-28 – 2022-03-29 (×2): 80 mg via ORAL
  Filled 2022-03-28 (×2): qty 2

## 2022-03-28 MED ORDER — INSULIN GLARGINE-YFGN 100 UNIT/ML ~~LOC~~ SOLN
6.0000 [IU] | Freq: Every day | SUBCUTANEOUS | Status: DC
  Administered 2022-03-28 – 2022-03-29 (×2): 6 [IU] via SUBCUTANEOUS
  Filled 2022-03-28 (×2): qty 0.06

## 2022-03-28 NOTE — Progress Notes (Signed)
Inpatient Diabetes Program Recommendations ? ?AACE/ADA: New Consensus Statement on Inpatient Glycemic Control (2015) ? ?Target Ranges:  Prepandial:   less than 140 mg/dL ?     Peak postprandial:   less than 180 mg/dL (1-2 hours) ?     Critically ill patients:  140 - 180 mg/dL  ? ?Lab Results  ?Component Value Date  ? GLUCAP 254 (H) 03/28/2022  ? HGBA1C 11.4 (H) 03/27/2022  ? ? ?Review of Glycemic Control ? Latest Reference Range & Units 03/27/22 16:22 03/27/22 21:59 03/28/22 08:04 03/28/22 12:04  ?Glucose-Capillary 70 - 99 mg/dL 175 (H) 106 (H) 125 (H) 254 (H)  ? ?Diabetes history: DM 1 ?Outpatient Diabetes medications:  ?Toujeo 18 units q HS ?Humalog 0-9 units tid with meals ?Dexcom G6 ?Current orders for Inpatient glycemic control:  ?Novolog moderate q 4 hours ?Inpatient Diabetes Program Recommendations:  ?Note history of sensitivity to insulin.  Consider reducing Novolog to Very sensitive (0-6 units) q 4 hours.  Patient received 6 units of Semglee yesterday.  Will need some basal today as well. ? ?Thanks,  ?Adah Perl, RN, BC-ADM ?Inpatient Diabetes Coordinator ?Pager [phone]  (8a-5p) ? ? ? ?

## 2022-03-28 NOTE — Plan of Care (Signed)
?  Problem: Health Behavior/Discharge Planning: ?Goal: Ability to manage health-related needs will improve ?Outcome: Progressing ?  ?Problem: Clinical Measurements: ?Goal: Ability to maintain clinical measurements within normal limits will improve ?Outcome: Progressing ?Goal: Will remain free from infection ?Outcome: Progressing ?Goal: Diagnostic test results will improve ?Outcome: Progressing ?  ?Problem: Activity: ?Goal: Risk for activity intolerance will decrease ?Outcome: Progressing ?  ?Problem: Nutrition: ?Goal: Adequate nutrition will be maintained ?Outcome: Progressing ?Note: Reports no nausea this morning, requesting to eat/drink. ?  ?Problem: Education: ?Goal: Knowledge of General Education information will improve ?Description: Including pain rating scale, medication(s)/side effects and non-pharmacologic comfort measures ?Outcome: Completed/Met ?  ?Problem: Clinical Measurements: ?Goal: Respiratory complications will improve ?Outcome: Completed/Met ?Goal: Cardiovascular complication will be avoided ?Outcome: Completed/Met ?  ?

## 2022-03-28 NOTE — Assessment & Plan Note (Addendum)
Patient is a type I diabetic. ?Continue with Levemir 6 units. ?She is brittle diabetic. ?Change sliding scale to sensitive scale ?Monitor CBG>  ?Stable for discharge.  ?

## 2022-03-28 NOTE — Assessment & Plan Note (Addendum)
Continue with Synthroid 

## 2022-03-28 NOTE — Hospital Course (Signed)
61 year old with past medical history significant for hypertension, hyperlipidemia, diabetes type 1 , marijuana abuse, gastroparesis, presents with abdominal pain, nausea vomiting that started 3 days prior to admission.  Her symptoms continued to get worse and she presented to the ED for assistance. ? ?Admitted for gastroparesis. ?

## 2022-03-28 NOTE — Assessment & Plan Note (Signed)
-

## 2022-03-28 NOTE — Assessment & Plan Note (Addendum)
Continue with core 

## 2022-03-28 NOTE — Progress Notes (Signed)
?  Progress Note ? ? ?Patient: [name] [name] DSK:[id number] DOB: Oct 06, 1961 DOA: 03/27/2022     0 ?DOS: the patient was seen and examined on 03/28/2022 ?  ?Brief hospital course: ?61 year old with past medical history significant for hypertension, hyperlipidemia, diabetes type 1 , marijuana abuse, gastroparesis, presents with abdominal pain, nausea vomiting that started 3 days prior to admission.  Her symptoms continued to get worse and she presented to the ED for assistance. ? ?Admitted for gastroparesis. ? ?Assessment and Plan: ?* Intractable nausea and vomiting ?Secondary to gastroparesis, lipase mildly elevated.  No significant abdominal pain today.  Improving ? ?Uncontrolled type 1 diabetes mellitus with hyperglycemia, with long-term current use of insulin ([place]) ?Patient is a type I diabetic. ?Continue with Levemir 6 units. ?She is brittle diabetic. ?Change sliding scale to sensitive scale ?Monitor CBG>  ? ?Dehydration ?Patient presents with dehydration secondary to nausea vomiting. ?BUN on admission 28. ?Improved with IV fluids. ? ?Marijuana abuse ?Counseling provided ? ?Gastroparesis due to DM [facility]) ?Patient presented with nausea vomiting and abdominal pain. ?Suspect related to gastroparesis. ?Symptoms improved.  ?Started Reglan.  Advance diet ? ?GERD ?Continue with Protonix ? ?Hyperlipidemia ?Resume  Lipitor ? ?Hypothyroidism ?Resume Synthroid ? ? ? ? ?  ? ?Subjective: she is feeling better, she was started on clear diet this am. Plan to advsnce further today.  ? ? ?Physical Exam: ?Vitals:  ? 03/27/22 1823 03/27/22 2201 03/28/22 0610 03/28/22 1327  ?BP: 119/74 102/72 106/83 114/69  ?Pulse: 91 80 81 87  ?Resp: '16 18 18 18  '$ ?Temp: 98.4 ?F (36.9 ?C) 98.3 ?F (36.8 ?C) (!) 97.5 ?F (36.4 ?C) (!) 97.5 ?F (36.4 ?C)  ?TempSrc: Oral Oral Oral Oral  ?SpO2: 98% 98% 99% 100%  ?Weight:      ?Height:      ? ?General; NAD ?Lung; CTA ?Abdomen; soft, nt, nd ? ?Data Reviewed: ? ?Cbc, cmet reviewed.  ? ?Family Communication: care  discussed with patient.  ? ?Disposition: ?Status is: Observation ?The patient remains OBS appropriate and will d/c before 2 midnights. ? Planned Discharge Destination: Home ? ? ? ?Time spent: 35 minutes ? ?Author: ?Elmarie Shiley, MD ?03/28/2022 3:32 PM ? ?For on call review [email or web address].  ?

## 2022-03-28 NOTE — Assessment & Plan Note (Signed)
Counseling provided

## 2022-03-28 NOTE — Assessment & Plan Note (Addendum)
Patient presents with dehydration secondary to nausea vomiting. ?BUN on admission 28. ?Improved with IV fluids. ?Resolved.  ?

## 2022-03-28 NOTE — Assessment & Plan Note (Addendum)
Patient presented with nausea vomiting and abdominal pain. ?Suspect related to gastroparesis. ?Symptoms improved.  ?Started Reglan.  Advance diet.  ?Tolerating diet. Stable for discharge ?

## 2022-03-28 NOTE — Assessment & Plan Note (Addendum)
Secondary to gastroparesis, lipase mildly elevated.  No significant abdominal pain today.  Improving ?Resolved. Tolerating diet.  ?

## 2022-03-29 ENCOUNTER — Observation Stay (HOSPITAL_COMMUNITY): Payer: BC Managed Care – PPO

## 2022-03-29 DIAGNOSIS — S91101A Unspecified open wound of right great toe without damage to nail, initial encounter: Secondary | ICD-10-CM

## 2022-03-29 DIAGNOSIS — M7731 Calcaneal spur, right foot: Secondary | ICD-10-CM | POA: Diagnosis not present

## 2022-03-29 DIAGNOSIS — M19071 Primary osteoarthritis, right ankle and foot: Secondary | ICD-10-CM | POA: Diagnosis not present

## 2022-03-29 DIAGNOSIS — R112 Nausea with vomiting, unspecified: Secondary | ICD-10-CM | POA: Diagnosis not present

## 2022-03-29 DIAGNOSIS — L089 Local infection of the skin and subcutaneous tissue, unspecified: Secondary | ICD-10-CM | POA: Diagnosis not present

## 2022-03-29 LAB — BASIC METABOLIC PANEL
Anion gap: 5 (ref 5–15)
BUN: 8 mg/dL (ref 6–20)
CO2: 24 mmol/L (ref 22–32)
Calcium: 8.2 mg/dL — ABNORMAL LOW (ref 8.9–10.3)
Chloride: 106 mmol/L (ref 98–111)
Creatinine, Ser: 0.62 mg/dL (ref 0.44–1.00)
GFR, Estimated: >60 mL/min (ref >60)
Glucose, Bld: 209 mg/dL — ABNORMAL HIGH (ref 70–99)
Potassium: 4.2 mmol/L (ref 3.5–5.1)
Sodium: 135 mmol/L (ref 135–145)

## 2022-03-29 LAB — GLUCOSE, CAPILLARY
Glucose-Capillary: 186 mg/dL — ABNORMAL HIGH (ref 70–99)
Glucose-Capillary: 318 mg/dL — ABNORMAL HIGH (ref 70–99)

## 2022-03-29 MED ORDER — METOCLOPRAMIDE HCL 5 MG PO TABS
5.0000 mg | ORAL_TABLET | Freq: Three times a day (TID) | ORAL | 2 refills | Status: DC
Start: 2022-03-29 — End: 2022-04-30

## 2022-03-29 MED ORDER — TOUJEO SOLOSTAR 300 UNIT/ML ~~LOC~~ SOPN: 6.0000 [IU] | PEN_INJECTOR | Freq: Every day | SUBCUTANEOUS | 0 refills | Status: DC

## 2022-03-29 MED ORDER — CEPHALEXIN 500 MG PO CAPS
500.0000 mg | ORAL_CAPSULE | Freq: Four times a day (QID) | ORAL | Status: DC
  Administered 2022-03-29: 500 mg via ORAL
  Filled 2022-03-29: qty 1

## 2022-03-29 MED ORDER — DOXYCYCLINE HYCLATE 100 MG PO TABS
100.0000 mg | ORAL_TABLET | Freq: Two times a day (BID) | ORAL | Status: DC
  Administered 2022-03-29: 100 mg via ORAL
  Filled 2022-03-29: qty 1

## 2022-03-29 MED ORDER — PANTOPRAZOLE SODIUM 40 MG PO TBEC: 40.0000 mg | DELAYED_RELEASE_TABLET | Freq: Every day | ORAL | 0 refills | Status: DC

## 2022-03-29 MED ORDER — DOXYCYCLINE HYCLATE 100 MG PO TABS: 100.0000 mg | ORAL_TABLET | Freq: Two times a day (BID) | ORAL | 0 refills | Status: AC

## 2022-03-29 MED ORDER — CEPHALEXIN 500 MG PO CAPS: 500.0000 mg | ORAL_CAPSULE | Freq: Three times a day (TID) | ORAL | 0 refills | Status: AC

## 2022-03-29 MED ORDER — SUCRALFATE 1 G PO TABS: 1.0000 g | ORAL_TABLET | Freq: Four times a day (QID) | ORAL | 1 refills | Status: DC

## 2022-03-29 NOTE — Plan of Care (Signed)
?  Problem: Health Behavior/Discharge Planning: ?Goal: Ability to manage health-related needs will improve ?03/29/2022 1359 by Annie Sable, RN ?Outcome: Adequate for Discharge ?03/29/2022 0837 by Annie Sable, RN ?Outcome: Progressing ?  ?Problem: Clinical Measurements: ?Goal: Ability to maintain clinical measurements within normal limits will improve ?03/29/2022 1359 by Annie Sable, RN ?Outcome: Adequate for Discharge ?03/29/2022 0837 by Annie Sable, RN ?Outcome: Progressing ?Goal: Will remain free from infection ?03/29/2022 1359 by Annie Sable, RN ?Outcome: Adequate for Discharge ?03/29/2022 0837 by Annie Sable, RN ?Outcome: Progressing ?Goal: Diagnostic test results will improve ?03/29/2022 1359 by Annie Sable, RN ?Outcome: Adequate for Discharge ?03/29/2022 0837 by Annie Sable, RN ?Outcome: Progressing ?  ?Problem: Coping: ?Goal: Level of anxiety will decrease ?03/29/2022 1359 by Annie Sable, RN ?Outcome: Adequate for Discharge ?03/29/2022 0837 by Annie Sable, RN ?Outcome: Progressing ?  ?Problem: Skin Integrity: ?Goal: Risk for impaired skin integrity will decrease ?03/29/2022 1359 by Annie Sable, RN ?Outcome: Adequate for Discharge ?03/29/2022 0837 by Annie Sable, RN ?Outcome: Progressing ?  ?Problem: Activity: ?Goal: Risk for activity intolerance will decrease ?Outcome: Completed/Met ?  ?Problem: Nutrition: ?Goal: Adequate nutrition will be maintained ?Outcome: Completed/Met ?  ?Problem: Elimination: ?Goal: Will not experience complications related to bowel motility ?Outcome: Completed/Met ?Goal: Will not experience complications related to urinary retention ?Outcome: Completed/Met ?  ?Problem: Pain Managment: ?Goal: General experience of comfort will improve ?Outcome: Completed/Met ?  ?Problem: Safety: ?Goal: Ability to remain free from injury will improve ?Outcome: Completed/Met ?  ?

## 2022-03-29 NOTE — Progress Notes (Signed)
?  Transition of Care (TOC) Screening Note ? ? ?Patient Details  ?Name: [name] [name] ?Date of Birth: Apr 15, 1961 ? ? ?Transition of Care [facility]) CM/SW Contact:    ?Dessa Phi, RN ?Phone Number: ?03/29/2022, 9:46 AM ? ? ? ?Transition of Care Department [facility]) has reviewed patient and no TOC needs have been identified at this time. We will continue to monitor patient advancement through interdisciplinary progression rounds. If new patient transition needs arise, please place a TOC consult. ?  ?

## 2022-03-29 NOTE — Progress Notes (Signed)
Assessed, cleansed and changed dressing to right dorsal foot wound. Foul odor and erythema to great toe noted. Dr. Tyrell Antonio paged and made aware.  ?

## 2022-03-29 NOTE — Discharge Summary (Signed)
?Physician Discharge Summary ?  ?Patient: [name] [name] MRN: [id number] DOB: Dec 18, 1960  ?Admit date:     03/27/2022  ?Discharge date: 03/29/22  ?Discharge Physician: Jerald Kief A Climmie Buelow  ? ?PCP: Jinny Sanders, MD  ? ?Recommendations at discharge:  ? ? Needs to follow up resolution of cellulitis right toe ,and follow on wound care.  ?Adjust insulin regimen as needed.  ? ? ?Discharge Diagnoses: ?Principal Problem: ?  Intractable nausea and vomiting ?Active Problems: ?  Hypothyroidism ?  Hyperlipidemia ?  GERD ?  Type 2 diabetes mellitus with neurological complications ([place]) ?  Gastroparesis due to DM [facility]) ?  Marijuana abuse ?  Dehydration ?  Uncontrolled type 1 diabetes mellitus with hyperglycemia, with long-term current use of insulin ([place]) ?  Open wound of right great toe ? ?Resolved Problems: ?  * No resolved hospital problems. * ? ?Hospital Course: ?61 year old with past medical history significant for hypertension, hyperlipidemia, diabetes type 1 , marijuana abuse, gastroparesis, presents with abdominal pain, nausea vomiting that started 3 days prior to admission.  Her symptoms continued to get worse and she presented to the ED for assistance. ? ?Admitted for gastroparesis. ? ?Assessment and Plan: ?* Intractable nausea and vomiting ?Secondary to gastroparesis, lipase mildly elevated.  No significant abdominal pain today.  Improving ?Resolved. Tolerating diet.  ? ?Open wound of right great toe ?She injured her foot while barefoot on the yard few weeks ago.  ?Appreciate wound care evaluation and recommendations.  ?X ray negative for osteo.  ?Plan to start doxy and keflex for 5 days. Close follow up with PCP>  ?She has some redness big toe. ? ?Uncontrolled type 1 diabetes mellitus with hyperglycemia, with long-term current use of insulin ([place]) ?Patient is a type I diabetic. ?Continue with Levemir 6 units. ?She is brittle diabetic. ?Change sliding scale to sensitive scale ?Monitor CBG>  ?Stable for discharge.   ? ?Dehydration ?Patient presents with dehydration secondary to nausea vomiting. ?BUN on admission 28. ?Improved with IV fluids. ?Resolved.  ? ?Marijuana abuse ?Counseling provided ? ?Gastroparesis due to DM [facility]) ?Patient presented with nausea vomiting and abdominal pain. ?Suspect related to gastroparesis. ?Symptoms improved.  ?Started Reglan.  Advance diet.  ?Tolerating diet. Stable for discharge ? ?GERD ?Continue with Protonix ? ?Hyperlipidemia ?Continue with  Lipitor ? ?Hypothyroidism ?Continue with  Synthroid ? ? ? ? ?  ? ? ?Consultants: None ?Procedures performed: None ?Disposition: Home ?Diet recommendation:  ?Discharge Diet Orders (From admission, onward)  ? ?  Start     Ordered  ? 03/29/22 0000  Diet - low sodium heart healthy       ? 03/29/22 0945  ? ?  ?  ? ?  ? ?Carb modified diet ?DISCHARGE MEDICATION: ?Allergies as of 03/29/2022   ? ?   Reactions  ? Codeine Hives, Anxiety, Other (See Comments)  ? ?  ? ?  ?Medication List  ?  ? ?STOP taking these medications   ? ?sucralfate 1 GM/10ML suspension ?Commonly known as: Carafate ?Replaced by: sucralfate 1 g tablet ?  ? ?  ? ?TAKE these medications   ? ?aspirin EC 81 MG tablet ?Take 81 mg by mouth daily. Swallow whole. ?  ?atorvastatin 80 MG tablet ?Commonly known as: LIPITOR ?Take 1 tablet (80 mg total) by mouth daily. ?  ?cephALEXin 500 MG capsule ?Commonly known as: KEFLEX ?Take 1 capsule (500 mg total) by mouth 3 (three) times daily for 5 days. ?  ?Dexcom G6 Sensor Misc ?INJECT 1 SENSOR INTO  SKIN EVERY 10 DAYS ?  ?Dexcom G6 Transmitter Misc ?USE AS DIRECTED FOR CONTINUOUS GLUCOSE MONITORING. REUSE TRANSMITTER X 90DAYS THEN DISCARD & REPLACE ?  ?doxycycline 100 MG tablet ?Commonly known as: VIBRA-TABS ?Take 1 tablet (100 mg total) by mouth every 12 (twelve) hours for 5 days. ?  ?insulin lispro 100 UNIT/ML KwikPen ?Commonly known as: HumaLOG KwikPen ?INJECT INTO SKIN 3 TIMES A DAY AT MEAL TIME ACCORDING TO THE FOLLOWING SLIDING SCALE: 27 UNITS MAX PER DAY DX  E11.43 ?What changed:  ?how much to take ?how to take this ?when to take this ?additional instructions ?  ?levothyroxine 137 MCG tablet ?Commonly known as: SYNTHROID ?Take 959 mcg by mouth every Sunday. ?  ?metoCLOPramide 5 MG tablet ?Commonly known as: REGLAN ?Take 1 tablet (5 mg total) by mouth 4 (four) times daily -  before meals and at bedtime. ?What changed:  ?how much to take ?how to take this ?when to take this ?additional instructions ?  ?midodrine 5 MG tablet ?Commonly known as: PROAMATINE ?Take 1 tablet (5 mg total) by mouth 2 (two) times daily with a meal. ?  ?onetouch ultrasoft lancets ?Use to check blood sugar daily as needed.  Dx: E11.10 ?  ?pantoprazole 40 MG tablet ?Commonly known as: PROTONIX ?Take 1 tablet (40 mg total) by mouth daily. ?  ?Pen Needles 31G X 8 MM Misc ?Use to inject insulin 4 times a day.  Dx: E11.10 ?  ?promethazine 25 MG tablet ?Commonly known as: PHENERGAN ?Take 1 tablet (25 mg total) by mouth every 8 (eight) hours as needed for nausea or vomiting. ?  ?sucralfate 1 g tablet ?Commonly known as: Carafate ?Take 1 tablet (1 g total) by mouth 4 (four) times daily. ?Replaces: sucralfate 1 GM/10ML suspension ?  ?Toujeo SoloStar 300 UNIT/ML Solostar Pen ?Generic drug: insulin glargine (1 Unit Dial) ?Inject 6 Units into the skin at bedtime. ?  ? ?  ? ?  ?  ? ? ?  ?Discharge Care Instructions  ?(From admission, onward)  ?  ? ? ?  ? ?  Start     Ordered  ? 03/29/22 0000  Discharge wound care:       ?Comments: See wound care recommendation  ? 03/29/22 0945  ? ?  ?  ? ?  ? ? Follow-up Information   ? ? [place] DEPT .   ?Specialty: Emergency Medicine ?Why: If symptoms worsen ?Contact information: ?[id number] ?[place] ?[phone] ? ?  ?  ? ?  ?  ? ?  ? ?Discharge Exam: ?Filed Weights  ? 03/27/22 0539  ?Weight: 52.6 kg  ? ?General; NAD ?Lung CTA ? ?Condition at discharge: stable ? ?The results of significant  diagnostics from this hospitalization (including imaging, microbiology, ancillary and laboratory) are listed below for reference.  ? ?Imaging Studies: ?DG Abd 1 View ? ?Result Date: 03/27/2022 ?CLINICAL DATA:  Abdominal pain and hyperglycemia. EXAM: ABDOMEN - 1 VIEW COMPARISON:  CT abdomen and pelvis 01/17/2022 FINDINGS: Gas is present in nondilated loops of small and large bowel without evidence of obstruction. No abnormal abdominal or pelvic calcification is identified. No acute osseous abnormality is seen. IMPRESSION: Negative. Electronically Signed   By: Logan Bores M.D.   On: 03/27/2022 13:59  ? ?DG Chest Portable 1 View ? ?Result Date: 03/27/2022 ?CLINICAL DATA:  Chest pain EXAM: PORTABLE CHEST 1 VIEW COMPARISON:  06/30/2021 FINDINGS: The heart size and mediastinal contours are within normal limits. Both lungs  are clear. The visualized skeletal structures are unremarkable. IMPRESSION: No active disease. Electronically Signed   By: Elmer Picker M.D.   On: 03/27/2022 11:48  ? ?DG Foot Complete Right ? ?Result Date: 03/29/2022 ?CLINICAL DATA:  "Toe infection". EXAM: RIGHT FOOT COMPLETE - 3+ VIEW COMPARISON:  None. FINDINGS: Degenerative changes of the first metatarsophalangeal joint. Bandaging about the medial forefoot. No acute fracture or dislocation. No osseous destruction. Small calcaneal spur. Vascular calcifications. IMPRESSION: No acute osseous abnormality. Electronically Signed   By: Abigail Miyamoto M.D.   On: 03/29/2022 13:29   ? ?Microbiology: ?Results for orders placed or performed during the hospital encounter of 01/17/22  ?Resp Panel by RT-PCR (Flu A&B, Covid) Nasopharyngeal Swab     Status: None  ? Collection Time: 01/17/22 11:14 PM  ? Specimen: Nasopharyngeal Swab; Nasopharyngeal(NP) swabs in vial transport medium  ?Result Value Ref Range Status  ? SARS Coronavirus 2 by RT PCR NEGATIVE NEGATIVE Final  ?  Comment: (NOTE) ?SARS-CoV-2 target nucleic acids are NOT DETECTED. ? ?The SARS-CoV-2 RNA is  generally detectable in upper respiratory ?specimens during the acute phase of infection. The lowest ?concentration of SARS-CoV-2 viral copies this assay can detect is ?138 copies/mL. A negative result does no

## 2022-03-29 NOTE — Progress Notes (Signed)
Pt discharged home today per Dr. Tyrell Antonio. Pt's IV site D/C'd and WDL. Pt's VSS. Pt provided with home medication list, discharge instructions and prescriptions. Verbalized understanding. Pt given instructions and shown how to care for right foot wound. Verbalized understanding. Provided with at home supplies for wound care. Pt left floor via WC in stable condition accompanied by NT.  ?

## 2022-03-29 NOTE — Plan of Care (Signed)
?  Problem: Health Behavior/Discharge Planning: ?Goal: Ability to manage health-related needs will improve ?Outcome: Progressing ?  ?Problem: Clinical Measurements: ?Goal: Ability to maintain clinical measurements within normal limits will improve ?Outcome: Progressing ?Goal: Will remain free from infection ?Outcome: Progressing ?Goal: Diagnostic test results will improve ?Outcome: Progressing ?  ?Problem: Coping: ?Goal: Level of anxiety will decrease ?Outcome: Progressing ?  ?Problem: Skin Integrity: ?Goal: Risk for impaired skin integrity will decrease ?Outcome: Progressing ?  ?Problem: Activity: ?Goal: Risk for activity intolerance will decrease ?Outcome: Completed/Met ?  ?Problem: Nutrition: ?Goal: Adequate nutrition will be maintained ?Outcome: Completed/Met ?  ?Problem: Elimination: ?Goal: Will not experience complications related to bowel motility ?Outcome: Completed/Met ?Goal: Will not experience complications related to urinary retention ?Outcome: Completed/Met ?  ?Problem: Pain Managment: ?Goal: General experience of comfort will improve ?Outcome: Completed/Met ?  ?Problem: Safety: ?Goal: Ability to remain free from injury will improve ?Outcome: Completed/Met ?  ?

## 2022-03-29 NOTE — Consult Note (Signed)
[facility] Nurse Consult Note: ?Reason for Consult: foot/toe wound ?Patient injured the webspace between the great toe and first toe on the right foot in the yard while barefooted. She has poorly controlled DM ?Wound type: trauma in the presence of DM ?Pressure Injury POA: NA ?Measurement:3cm x 1.5cm x 0.4cm  ?Wound EUM:PNTIRW/ERXV, pale; some serous crust and darkening of the plantar surface of the great toe joint and superficial ruptured blistering  ?Drainage (amount, consistency, odor) moist; yellow drainage  ?Periwound: macerated; neuropathy  ?Dressing procedure/placement/frequency: ?Silver hydrofiber to the affected area, tuck into small opening, cover area to absorb drainage. Top with dry dressing, teach patient to perform. Planning DC today, follow up with endocrinologist or primary care MD on wound and wound status/healing ? ?Spoke with dr. Tyrell Antonio, recommended xray to rule out osteomyelitis; injury is not acute and she reports open wound for several weeks.  ? ? ?Discussed POC with patient and bedside nurse.  ?Re consult if needed, will not follow at this time. ?Thanks ? Brentley Landfair [facility] MSN, RN,CWOCN, CNS, CWON-AP [phone])  ? ?  ?

## 2022-03-29 NOTE — Assessment & Plan Note (Signed)
She injured her foot while barefoot on the yard few weeks ago.  ?Appreciate wound care evaluation and recommendations.  ?X ray negative for osteo.  ?Plan to start doxy and keflex for 5 days. Close follow up with PCP>  ?She has some redness big toe. ?

## 2022-03-29 NOTE — Progress Notes (Signed)
Inpatient Diabetes Program Recommendations ? ?AACE/ADA: New Consensus Statement on Inpatient Glycemic Control (2015) ? ?Target Ranges:  Prepandial:   less than 140 mg/dL ?     Peak postprandial:   less than 180 mg/dL (1-2 hours) ?     Critically ill patients:  140 - 180 mg/dL  ? ?Lab Results  ?Component Value Date  ? GLUCAP 186 (H) 03/29/2022  ? HGBA1C 11.4 (H) 03/27/2022  ? ? ?Review of Glycemic Control ? ?Diabetes history: DM1(does not make insulin.  Needs correction, basal and meal coverage) ? ?Outpatient Diabetes medications: Toujeo 6 units QD, Humalog 1-4 units with meals tid ? ?Current orders for Inpatient glycemic control: Semglee 6 units QD, Novolog 0-9 tid and 0-5 qhs ? ?Inpatient Diabetes Program Recommendations:   ? ?Novolog 2 units TID with meals if eats at least 50% ? ?Will continue to follow while inpatient. ? ?Thank you, ?Reche Dixon, MSN, RN ?Diabetes Coordinator ?Inpatient Diabetes Program ?[phone] (team pager from 8a-5p) ? ? ? ?

## 2022-04-24 ENCOUNTER — Ambulatory Visit: Payer: BC Managed Care – PPO | Admitting: Family Medicine

## 2022-04-24 ENCOUNTER — Encounter: Payer: Self-pay | Admitting: Family Medicine

## 2022-04-24 VITALS — BP 90/60 | HR 95 | Temp 98.2°F | Ht 59.75 in | Wt 114.2 lb

## 2022-04-24 DIAGNOSIS — L03115 Cellulitis of right lower limb: Secondary | ICD-10-CM | POA: Diagnosis not present

## 2022-04-24 DIAGNOSIS — S91101D Unspecified open wound of right great toe without damage to nail, subsequent encounter: Secondary | ICD-10-CM | POA: Diagnosis not present

## 2022-04-24 DIAGNOSIS — E1149 Type 2 diabetes mellitus with other diabetic neurological complication: Secondary | ICD-10-CM | POA: Diagnosis not present

## 2022-04-24 MED ORDER — CEFTRIAXONE SODIUM 1 G IJ SOLR
1.0000 g | Freq: Once | INTRAMUSCULAR | Status: AC
  Administered 2022-04-24: 1 g via INTRAMUSCULAR

## 2022-04-24 MED ORDER — DOXYCYCLINE HYCLATE 100 MG PO TABS
100.0000 mg | ORAL_TABLET | Freq: Two times a day (BID) | ORAL | 0 refills | Status: DC
Start: 2022-04-24 — End: 2022-08-14

## 2022-04-24 NOTE — Progress Notes (Signed)
Patient ID: [name] [name], female    DOB: February 17, 1961, 61 y.o.   MRN: [id number]  This visit was conducted in person.  BP 90/60   Pulse 95   Temp 98.2 F (36.8 C) (Oral)   Ht 4' 11.75" (1.518 m)   Wt 114 lb 4 oz (51.8 kg)   LMP 08/11/2012   SpO2 97%   BMI 22.50 kg/m    CC:  sore on left foot   Subjective:   HPI: [name] [name] is a 61 y.o. female  with poorly controlled diabetes presenting on 04/24/2022 for  sore on foot.   She reports she  injured her foot when running outside after dog escaped.  She stepped on something.. kept clean. Seen in hospital for GI issue.. never followed up as instructed with PCP. X ray negative for osteo Treated for possible infection ( doxy and keflex for 5 days), seen by wound care.. given gel patch.   It seemed to be healing, but in last week it has more odor ,  discharge ( bloody to yellow) swelling in foot and more tenderness. Has been treating with peroxide.  Wt Readings from Last 3 Encounters:  04/24/22 114 lb 4 oz (51.8 kg)  03/27/22 116 lb (52.6 kg)  03/02/22 115 lb (52.2 kg)   Lab Results  Component Value Date   HGBA1C 11.4 (H) 03/27/2022        Relevant past medical, surgical, family and social history reviewed and updated as indicated. Interim medical history since our last visit reviewed. Allergies and medications reviewed and updated. Outpatient Medications Prior to Visit  Medication Sig Dispense Refill   aspirin EC 81 MG tablet Take 81 mg by mouth daily. Swallow whole.     atorvastatin (LIPITOR) 80 MG tablet Take 1 tablet (80 mg total) by mouth daily. 90 tablet 3   Continuous Blood Gluc Sensor (DEXCOM G6 SENSOR) MISC INJECT 1 SENSOR INTO SKIN EVERY 10 DAYS 3 each 11   Continuous Blood Gluc Transmit (DEXCOM G6 TRANSMITTER) MISC USE AS DIRECTED FOR CONTINUOUS GLUCOSE MONITORING. REUSE TRANSMITTER X 90DAYS THEN DISCARD & REPLACE 1 each 3   insulin lispro (HUMALOG KWIKPEN) 100 UNIT/ML KwikPen INJECT INTO SKIN 3 TIMES A DAY  AT MEAL TIME ACCORDING TO THE FOLLOWING SLIDING SCALE: 27 UNITS MAX PER DAY DX E11.43 (Patient taking differently: Inject 1-4 Units into the skin with breakfast, with lunch, and with evening meal. Per sliding scale.) 15 mL 5   Insulin Pen Needle (PEN NEEDLES) 31G X 8 MM MISC Use to inject insulin 4 times a day.  Dx: E11.10 130 each 11   Lancets (ONETOUCH ULTRASOFT) lancets Use to check blood sugar daily as needed.  Dx: E11.10 100 each 3   levothyroxine (SYNTHROID) 137 MCG tablet Take 959 mcg by mouth every Sunday.     metoCLOPramide (REGLAN) 5 MG tablet Take 1 tablet (5 mg total) by mouth 4 (four) times daily -  before meals and at bedtime. 60 tablet 2   midodrine (PROAMATINE) 5 MG tablet Take 1 tablet (5 mg total) by mouth 2 (two) times daily with a meal. 180 tablet 3   pantoprazole (PROTONIX) 40 MG tablet Take 1 tablet (40 mg total) by mouth daily. 30 tablet 0   promethazine (PHENERGAN) 25 MG tablet Take 1 tablet (25 mg total) by mouth every 8 (eight) hours as needed for nausea or vomiting. 30 tablet 3   sucralfate (CARAFATE) 1 g tablet Take 1 tablet (1 g total) by  mouth 4 (four) times daily. 120 tablet 1   TOUJEO SOLOSTAR 300 UNIT/ML Solostar Pen Inject 6 Units into the skin at bedtime. 100 mL 0   No facility-administered medications prior to visit.     Per HPI unless specifically indicated in ROS section below Review of Systems  Constitutional:  Negative for fatigue and fever.  HENT:  Negative for congestion.   Eyes:  Negative for pain.  Respiratory:  Negative for cough and shortness of breath.   Cardiovascular:  Negative for chest pain, palpitations and leg swelling.  Gastrointestinal:  Negative for abdominal pain.  Genitourinary:  Negative for dysuria and vaginal bleeding.  Musculoskeletal:  Negative for back pain.  Neurological:  Negative for syncope, light-headedness and headaches.  Psychiatric/Behavioral:  Negative for dysphoric mood.    Objective:  BP 90/60   Pulse 95   Temp  98.2 F (36.8 C) (Oral)   Ht 4' 11.75" (1.518 m)   Wt 114 lb 4 oz (51.8 kg)   LMP 08/11/2012   SpO2 97%   BMI 22.50 kg/m   Wt Readings from Last 3 Encounters:  04/24/22 114 lb 4 oz (51.8 kg)  03/02/22 115 lb (52.2 kg)  01/17/22 113 lb (51.3 kg)      Physical Exam Constitutional:      General: She is not in acute distress.    Appearance: Normal appearance. She is well-developed. She is not ill-appearing or toxic-appearing.  HENT:     Head: Normocephalic.     Right Ear: Hearing, tympanic membrane, ear canal and external ear normal. Tympanic membrane is not erythematous, retracted or bulging.     Left Ear: Hearing, tympanic membrane, ear canal and external ear normal. Tympanic membrane is not erythematous, retracted or bulging.     Nose: No mucosal edema or rhinorrhea.     Right Sinus: No maxillary sinus tenderness or frontal sinus tenderness.     Left Sinus: No maxillary sinus tenderness or frontal sinus tenderness.     Mouth/Throat:     Pharynx: Uvula midline.  Eyes:     General: Lids are normal. Lids are everted, no foreign bodies appreciated.     Conjunctiva/sclera: Conjunctivae normal.     Pupils: Pupils are equal, round, and reactive to light.  Neck:     Thyroid: No thyroid mass or thyromegaly.     Vascular: No carotid bruit.     Trachea: Trachea normal.  Cardiovascular:     Rate and Rhythm: Normal rate and regular rhythm.     Pulses: Normal pulses.     Heart sounds: Normal heart sounds, S1 normal and S2 normal. No murmur heard.    No friction rub. No gallop.  Pulmonary:     Effort: Pulmonary effort is normal. No tachypnea or respiratory distress.     Breath sounds: Normal breath sounds. No decreased breath sounds, wheezing, rhonchi or rales.  Abdominal:     General: Bowel sounds are normal.     Palpations: Abdomen is soft.     Tenderness: There is no abdominal tenderness.  Musculoskeletal:     Cervical back: Normal range of motion and neck supple.  Skin:     General: Skin is warm and dry.     Findings: No rash.  Neurological:     Mental Status: She is alert.  Psychiatric:        Mood and Affect: Mood is not anxious or depressed.        Speech: Speech normal.  Behavior: Behavior normal. Behavior is cooperative.        Thought Content: Thought content normal.        Judgment: Judgment normal.            Results for orders placed or performed during the hospital encounter of 01/17/22  Resp Panel by RT-PCR (Flu A&B, Covid) Nasopharyngeal Swab   Specimen: Nasopharyngeal Swab; Nasopharyngeal(NP) swabs in vial transport medium  Result Value Ref Range   SARS Coronavirus 2 by RT PCR NEGATIVE NEGATIVE   Influenza A by PCR NEGATIVE NEGATIVE   Influenza B by PCR NEGATIVE NEGATIVE  CBC with Differential  Result Value Ref Range   WBC 16.8 (H) 4.0 - 10.5 K/uL   RBC 5.19 (H) 3.87 - 5.11 MIL/uL   Hemoglobin 15.7 (H) 12.0 - 15.0 g/dL   HCT 43.7 36.0 - 46.0 %   MCV 84.2 80.0 - 100.0 fL   MCH 30.3 26.0 - 34.0 pg   MCHC 35.9 30.0 - 36.0 g/dL   RDW 14.2 11.5 - 15.5 %   Platelets 332 150 - 400 K/uL   nRBC 0.0 0.0 - 0.2 %   Neutrophils Relative % 82 %   Neutro Abs 13.8 (H) 1.7 - 7.7 K/uL   Lymphocytes Relative 9 %   Lymphs Abs 1.6 0.7 - 4.0 K/uL   Monocytes Relative 8 %   Monocytes Absolute 1.3 (H) 0.1 - 1.0 K/uL   Eosinophils Relative 0 %   Eosinophils Absolute 0.0 0.0 - 0.5 K/uL   Basophils Relative 0 %   Basophils Absolute 0.0 0.0 - 0.1 K/uL   Immature Granulocytes 1 %   Abs Immature Granulocytes 0.11 (H) 0.00 - 0.07 K/uL  Comprehensive metabolic panel  Result Value Ref Range   Sodium 135 135 - 145 mmol/L   Potassium 3.5 3.5 - 5.1 mmol/L   Chloride 90 (L) 98 - 111 mmol/L   CO2 29 22 - 32 mmol/L   Glucose, Bld 241 (H) 70 - 99 mg/dL   BUN 21 (H) 6 - 20 mg/dL   Creatinine, Ser 0.76 0.44 - 1.00 mg/dL   Calcium 9.2 8.9 - 10.3 mg/dL   Total Protein 7.7 6.5 - 8.1 g/dL   Albumin 4.3 3.5 - 5.0 g/dL   AST 24 15 - 41 U/L   ALT 13 0 -  44 U/L   Alkaline Phosphatase 61 38 - 126 U/L   Total Bilirubin 0.6 0.3 - 1.2 mg/dL   GFR, Estimated >60 >60 mL/min   Anion gap 16 (H) 5 - 15  Lipase, blood  Result Value Ref Range   Lipase 185 (H) 11 - 51 U/L  Ethanol  Result Value Ref Range   Alcohol, Ethyl (B) <10 <10 mg/dL  Urinalysis, Routine w reflex microscopic  Result Value Ref Range   Color, Urine YELLOW YELLOW   APPearance HAZY (A) CLEAR   Specific Gravity, Urine 1.023 1.005 - 1.030   pH 6.0 5.0 - 8.0   Glucose, UA >=500 (A) NEGATIVE mg/dL   Hgb urine dipstick SMALL (A) NEGATIVE   Bilirubin Urine NEGATIVE NEGATIVE   Ketones, ur 20 (A) NEGATIVE mg/dL   Protein, ur 100 (A) NEGATIVE mg/dL   Nitrite NEGATIVE NEGATIVE   Leukocytes,Ua TRACE (A) NEGATIVE   RBC / HPF 0-5 0 - 5 RBC/hpf   WBC, UA 0-5 0 - 5 WBC/hpf   Bacteria, UA RARE (A) NONE SEEN   Squamous Epithelial / LPF 6-10 0 - 5   Hyaline Casts, UA PRESENT  Beta-hydroxybutyric acid  Result Value Ref Range   Beta-Hydroxybutyric Acid 1.52 (H) 0.05 - 0.27 mmol/L  Magnesium  Result Value Ref Range   Magnesium 1.8 1.7 - 2.4 mg/dL  Acetaminophen level  Result Value Ref Range   Acetaminophen (Tylenol), Serum <10 (L) 10 - 30 ug/mL  Salicylate level  Result Value Ref Range   Salicylate Lvl <7.5 (L) 7.0 - 30.0 mg/dL  Comprehensive metabolic panel  Result Value Ref Range   Sodium 136 135 - 145 mmol/L   Potassium 2.4 (LL) 3.5 - 5.1 mmol/L   Chloride 102 98 - 111 mmol/L   CO2 26 22 - 32 mmol/L   Glucose, Bld 203 (H) 70 - 99 mg/dL   BUN 15 6 - 20 mg/dL   Creatinine, Ser 0.46 0.44 - 1.00 mg/dL   Calcium 7.7 (L) 8.9 - 10.3 mg/dL   Total Protein 6.3 (L) 6.5 - 8.1 g/dL   Albumin 3.4 (L) 3.5 - 5.0 g/dL   AST 22 15 - 41 U/L   ALT 13 0 - 44 U/L   Alkaline Phosphatase 51 38 - 126 U/L   Total Bilirubin 0.7 0.3 - 1.2 mg/dL   GFR, Estimated >60 >60 mL/min   Anion gap 8 5 - 15  Beta-hydroxybutyric acid  Result Value Ref Range   Beta-Hydroxybutyric Acid 1.00 (H) 0.05 - 0.27  mmol/L  Basic metabolic panel  Result Value Ref Range   Sodium 134 (L) 135 - 145 mmol/L   Potassium 3.6 3.5 - 5.1 mmol/L   Chloride 105 98 - 111 mmol/L   CO2 24 22 - 32 mmol/L   Glucose, Bld 156 (H) 70 - 99 mg/dL   BUN 18 6 - 20 mg/dL   Creatinine, Ser 0.46 0.44 - 1.00 mg/dL   Calcium 7.9 (L) 8.9 - 10.3 mg/dL   GFR, Estimated >60 >60 mL/min   Anion gap 5 5 - 15  CBG monitoring, ED  Result Value Ref Range   Glucose-Capillary 247 (H) 70 - 99 mg/dL  CBG monitoring, ED  Result Value Ref Range   Glucose-Capillary 185 (H) 70 - 99 mg/dL  POC CBG, ED  Result Value Ref Range   Glucose-Capillary 253 (H) 70 - 99 mg/dL  CBG monitoring, ED  Result Value Ref Range   Glucose-Capillary 198 (H) 70 - 99 mg/dL  POC CBG, ED  Result Value Ref Range   Glucose-Capillary 199 (H) 70 - 99 mg/dL  POC CBG, ED  Result Value Ref Range   Glucose-Capillary 188 (H) 70 - 99 mg/dL  POC CBG, ED  Result Value Ref Range   Glucose-Capillary 136 (H) 70 - 99 mg/dL     COVID 19 screen:  No recent travel or known exposure to COVID19 The patient denies respiratory symptoms of COVID 19 at this time. The importance of social distancing was discussed today.   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  This was discussed in detail with patient.      Relevant Orders   AMB referral to wound care center   Open wound of right great toe - Primary     Acute, Elevate feet above heart. When wound improved wear compression hose. Can use Voltaren gel OTC on knees for pain  We will work on wound care referral ASAP for longterm management of the wound. Start antibiotics orally.  Relevant Orders   AMB referral to wound care center   Other Visit Diagnoses     Cellulitis of right lower extremity       Relevant Orders   AMB referral to wound care  center      Meds ordered this encounter  Medications   doxycycline (VIBRA-TABS) 100 MG tablet    Sig: Take 1 tablet (100 mg total) by mouth 2 (two) times daily.    Dispense:  20 tablet    Refill:  0   cefTRIAXone (ROCEPHIN) injection 1 g    Order Specific Question:   Antibiotic Indication:    Answer:   Cellulitis       Eliezer Lofts, MD

## 2022-04-24 NOTE — Patient Instructions (Addendum)
Elevate feet above heart. When wound improved wear compression hose. ?Can use Voltaren gel OTC on knees for pain ? We will work on wound care referral ASAP for longterm management of the wound. ?Start antibiotics orally. ?

## 2022-04-26 ENCOUNTER — Encounter: Payer: Self-pay | Admitting: Family Medicine

## 2022-04-27 ENCOUNTER — Ambulatory Visit: Payer: BC Managed Care – PPO | Admitting: Family Medicine

## 2022-04-30 ENCOUNTER — Encounter: Payer: Self-pay | Admitting: Gastroenterology

## 2022-04-30 ENCOUNTER — Ambulatory Visit: Payer: BC Managed Care – PPO | Admitting: Gastroenterology

## 2022-04-30 VITALS — BP 78/52 | HR 88 | Ht 59.75 in | Wt 116.2 lb

## 2022-04-30 DIAGNOSIS — R112 Nausea with vomiting, unspecified: Secondary | ICD-10-CM

## 2022-04-30 MED ORDER — PANTOPRAZOLE SODIUM 40 MG PO TBEC: 40.0000 mg | DELAYED_RELEASE_TABLET | Freq: Two times a day (BID) | ORAL | 0 refills | Status: DC

## 2022-04-30 MED ORDER — METOCLOPRAMIDE HCL 5 MG PO TABS: 5.0000 mg | ORAL_TABLET | Freq: Three times a day (TID) | ORAL | 11 refills | Status: DC

## 2022-04-30 NOTE — Progress Notes (Signed)
Review of pertinent gastrointestinal problems: 1.  Dysphasia.  Led to EGD Dr. Ardis Hughs February 2018 Schatzki's ring was noted and dilated up to 20 mm. 2.  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  Gastric emptying scan April 2018 was normal however.  Has frequent episodes of DKA.  During 1 episode of DKA while hospitalized December 2019 she underwent a CT scan of her abdomen pelvis with IV and oral contrast for "abdominal pain with nausea and vomiting" it was normal. 3. Marijuana smoker (may contribute to #2)` 4.  Routine risk for colon cancer.  05/2019 colonoscopy Dr. Ardis Hughs found diverticulosis but was otherwise normal. 5.  Severe esophagitis, duodenal ulcers found on small bowel endoscopy while admitted 05/2019 for nausea, vomiting, DKA.  Biopsies suggested no H. pylori infection.  Very poorly controlled diabetes hemoglobin A1c around 11.  Her PPI was doubled.  EGD 08/2020 during another admission for DKA, imaging showing abnormal esophagus showed persistent severe ulcerative esophagitis.  HPI: This is a very pleasant 61 year old woman  Hemoglobin A1c 03/2022 was 11.4  She continues to have difficulty with nausea and intermittent vomiting.  When she was discharged from the hospital last month she was told to be on Protonix twice daily however she is not.  She needs refills on her Reglan.  I asked her about her THC intake and she said she is cutting back however she reeks of marijuana today and I pointed that out to her.   ROS: complete GI ROS as described in HPI, all other review negative.  Constitutional:  No unintentional weight loss   Past Medical History:  Diagnosis Date   Allergy    seasonal   Anxiety    B12 deficiency    Chest pain 04/05/2017   CHF (congestive heart failure) ([place])    Diabetes mellitus type 1, uncontrolled    "dr. 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) 10/29/2013   Leg cramping    "@ night" (09/18/2013)   Migraine    "once q couple months" (11/26/2018)   Osteoporosis    Peripheral neuropathy    Umbilical hernia    unrepaired (09/18/2013)    Past Surgical History:  Procedure Laterality Date   BIOPSY  06/03/2019   Procedure: BIOPSY;  Surgeon: Jackquline Denmark, MD;  Location: [facility];  Service: Endoscopy;;   BIOPSY  08/17/2020   Procedure: BIOPSY;  Surgeon: Thornton Park, MD;  Location: [facility];  Service: Gastroenterology;;   [place]   ENTEROSCOPY N/A 06/03/2019   Procedure: ENTEROSCOPY;  Surgeon: Jackquline Denmark, MD;  Location: [facility];  Service: Endoscopy;  Laterality: N/A;   ESOPHAGOGASTRODUODENOSCOPY (EGD) WITH PROPOFOL N/A 08/17/2020   Procedure: ESOPHAGOGASTRODUODENOSCOPY (EGD) WITH PROPOFOL;  Surgeon: Thornton Park, MD;  Location: [facility];  Service: Gastroenterology;  Laterality: N/A;   LAPAROSCOPIC ASSISTED VAGINAL HYSTERECTOMY  09/23/2012   Procedure: LAPAROSCOPIC ASSISTED VAGINAL HYSTERECTOMY;  Surgeon: Linda Hedges, DO;  Location: [facility];  Service: Gynecology;  Laterality: N/A;  pull Dr Gregor Hams instrument   LEFT HEART CATH AND CORONARY ANGIOGRAPHY N/A 02/11/2017   Procedure: Left Heart Cath and Coronary Angiography;  Surgeon: Lorretta Harp, MD;  Location: [facility];  Service:  Cardiovascular;  Laterality: N/A;   TUBAL LIGATION Bilateral 1992    Current Outpatient Medications  Medication Instructions   aspirin EC 81 mg, Oral, Daily, Swallow whole.   atorvastatin (LIPITOR) 80 mg, Oral, Daily   Continuous Blood Gluc Sensor (DEXCOM G6 SENSOR) MISC INJECT 1 SENSOR INTO SKIN EVERY 10 DAYS   Continuous Blood Gluc Transmit (DEXCOM G6 TRANSMITTER) MISC USE AS DIRECTED FOR CONTINUOUS GLUCOSE  MONITORING. REUSE TRANSMITTER X 90DAYS THEN DISCARD & REPLACE   doxycycline (VIBRA-TABS) 100 mg, Oral, 2 times daily   insulin lispro (HUMALOG KWIKPEN) 100 UNIT/ML KwikPen INJECT INTO SKIN 3 TIMES A DAY AT MEAL TIME ACCORDING TO THE FOLLOWING SLIDING SCALE: 27 UNITS MAX PER DAY DX E11.43   Insulin Pen Needle (PEN NEEDLES) 31G X 8 MM MISC Use to inject insulin 4 times a day.  Dx: E11.10   Lancets (ONETOUCH ULTRASOFT) lancets Use to check blood sugar daily as needed.  Dx: E11.10   levothyroxine (SYNTHROID) 959 mcg, Oral, Every Sun   metoCLOPramide (REGLAN) 5 mg, Oral, 3 times daily before meals & bedtime   midodrine (PROAMATINE) 5 mg, Oral, 2 times daily with meals   pantoprazole (PROTONIX) 40 mg, Oral, Daily   promethazine (PHENERGAN) 25 mg, Oral, Every 8 hours PRN   sucralfate (CARAFATE) 1 g, Oral, 4 times daily   Toujeo SoloStar 6 Units, Subcutaneous, Daily at bedtime    Allergies as of 04/30/2022 - Review Complete 04/30/2022  Allergen Reaction Noted   Codeine Hives, Anxiety, and Other (See Comments)     Family History  Problem Relation Age of Onset   Diabetes Other    Heart disease Other    Heart failure Mother 84       Her heart skipped a beat and stopped - per pt   Diabetes Maternal Grandmother    Alzheimer's disease Maternal Grandmother    Coronary artery disease Maternal Grandmother    Heart attack Maternal Grandfather    Heart failure Brother    Diabetes Brother    Colon polyps Neg Hx    Esophageal cancer Neg Hx    Liver cancer Neg Hx    Pancreatic cancer Neg Hx    Stomach cancer Neg Hx    Crohn's disease Neg Hx    Rectal cancer Neg Hx     Social History   Socioeconomic History   Marital status: Married    Spouse name: Not on file   Number of children: 2   Years of education: Not on file   Highest education level: Not on file  Occupational History   Occupation: UNEMPLOYED    Employer: UNEMPLOYED    Comment: homemaker  Tobacco Use   Smoking status: Former     Packs/day: 0.12    Years: 10.00    Pack years: 1.20    Types: Cigarettes    Quit date: 12/10/1990    Years since quitting: 31.4   Smokeless tobacco: Never  Vaping Use   Vaping Use: Never used  Substance and Sexual Activity   Alcohol use: Never    Alcohol/week: 0.0 standard drinks   Drug use: Yes    Types: Marijuana    Comment: last use 2 days ago   Sexual activity: Not Currently  Other Topics Concern   Not on file  Social History Narrative   Regular exercise- no    Diet- lots of mountain dew, limits fast foods    Social Determinants of Health   Financial Resource Strain: Not on file  Food  Insecurity: Not on file  Transportation Needs: Not on file  Physical Activity: Not on file  Stress: Not on file  Social Connections: Not on file  Intimate Partner Violence: Not on file     Physical Exam: Pulse 88   Ht 4' 11.75" (1.518 m) Comment: height measured without shoes  Wt 116 lb 4 oz (52.7 kg)   LMP 08/11/2012   BMI 22.89 kg/m  Constitutional: generally well-appearing Psychiatric: alert and oriented x3 Abdomen: soft, nontender, nondistended, no obvious ascites, no peritoneal signs, normal bowel sounds No peripheral edema noted in lower extremities  Assessment and plan: 61 y.o. female with multifactorial nausea and vomiting  #1 cause of her nausea and vomiting is her out-of-control diabetes, her hemoglobin A1c was 11.3 last month.  She needs to get that under better control, without that I think she will continue to be bothered by nausea and vomiting indefinitely.  She needed refills on her Reglan which I am happy to give to her, 5 mg pills 4 times daily.  Her nausea and vomiting is causing severe esophagitis, ulcerative.  She is not on twice daily proton pump inhibitor like she is supposed to be and so I am giving her a new prescription for that.  We discussed the fact that she reeks of marijuana today and chronic THC use is well-known to contribute to nausea and vomiting.   She is going to redouble her efforts to try to cut back and quit.  Please see the "Patient Instructions" section for addition details about the plan.  Owens Loffler, MD [facility] 04/30/2022, 9:54 AM   Total time on date of encounter was 40 minutes (this included time spent preparing to see the patient reviewing records; obtaining and/or reviewing separately obtained history; performing a medically appropriate exam and/or evaluation; counseling and educating the patient and family if present; ordering medications, tests or procedures if applicable; and documenting clinical information in the health record).

## 2022-04-30 NOTE — Patient Instructions (Signed)
If you are age 59 or younger, your body mass index should be between 19-25. Your Body mass index is 22.89 kg/m. If this is out of the aformentioned range listed, please consider follow up with your Primary Care Provider.  ________________________________________________________  The [facility] providers would like to encourage you to use [facility] to communicate with providers for non-urgent requests or questions.  Due to long hold times on the telephone, sending your provider a message by [facility] may be a faster and more efficient way to get a response.  Please allow 48 business hours for a response.  Please remember that this is for non-urgent requests.  _______________________________________________________  We have sent the following medications to your pharmacy for you to pick up at your convenience:  INCREASE: pantoprazole '40mg'$  one tablet twice daily shortly before breakfast and dinner meal. Reglan  Please attempt to maintain better blood sugar results and get better control of your diabetes.  Please try to decrease THC (marijuana) use.  Thank you for entrusting me with your care and choosing [facility].  Dr Ardis Hughs

## 2022-05-01 ENCOUNTER — Other Ambulatory Visit (HOSPITAL_COMMUNITY): Payer: Self-pay

## 2022-05-01 ENCOUNTER — Telehealth: Payer: Self-pay | Admitting: Pharmacy Technician

## 2022-05-01 DIAGNOSIS — H2513 Age-related nuclear cataract, bilateral: Secondary | ICD-10-CM | POA: Diagnosis not present

## 2022-05-01 DIAGNOSIS — E103393 Type 1 diabetes mellitus with moderate nonproliferative diabetic retinopathy without macular edema, bilateral: Secondary | ICD-10-CM | POA: Diagnosis not present

## 2022-05-01 NOTE — Telephone Encounter (Signed)
Patient Advocate Encounter  Received notification from [place] that prior authorization for PANTOPRAZOLE '40MG'$  is required.   PA submitted on 5.23.23 Key Take 1 tablet (550 mg total) by mouth 3 (three) times daily for 14 days. Status is pending   [facility] will continue to follow  Luciano Cutter, CPhT Patient Advocate Phone: [phone]

## 2022-05-02 ENCOUNTER — Encounter (HOSPITAL_BASED_OUTPATIENT_CLINIC_OR_DEPARTMENT_OTHER): Payer: BC Managed Care – PPO | Attending: General Surgery | Admitting: General Surgery

## 2022-05-03 ENCOUNTER — Other Ambulatory Visit (HOSPITAL_COMMUNITY): Payer: Self-pay

## 2022-05-03 NOTE — Telephone Encounter (Signed)
Received notification from [facility] regarding a prior authorization for PANTOPRAZOLE '40MG'$ . Authorization has been APPROVED from 5.23.23 to 5.21.24.   Per test claim, copay for 30 days supply is $3.52

## 2022-05-06 ENCOUNTER — Encounter: Payer: Self-pay | Admitting: Gastroenterology

## 2022-05-10 ENCOUNTER — Other Ambulatory Visit: Payer: Self-pay

## 2022-05-10 ENCOUNTER — Emergency Department (HOSPITAL_COMMUNITY)
Admission: EM | Admit: 2022-05-10 | Discharge: 2022-05-11 | Disposition: A | Discharge status: Home / Self Care | Payer: BC Managed Care – PPO | Attending: Emergency Medicine | Admitting: Emergency Medicine

## 2022-05-10 ENCOUNTER — Encounter (HOSPITAL_COMMUNITY): Payer: Self-pay | Admitting: Emergency Medicine

## 2022-05-10 ENCOUNTER — Emergency Department (HOSPITAL_COMMUNITY): Payer: BC Managed Care – PPO

## 2022-05-10 DIAGNOSIS — Z7982 Long term (current) use of aspirin: Secondary | ICD-10-CM | POA: Diagnosis not present

## 2022-05-10 DIAGNOSIS — I509 Heart failure, unspecified: Secondary | ICD-10-CM | POA: Diagnosis not present

## 2022-05-10 DIAGNOSIS — J929 Pleural plaque without asbestos: Secondary | ICD-10-CM | POA: Diagnosis not present

## 2022-05-10 DIAGNOSIS — I130 Hypertensive heart and chronic kidney disease with heart failure and stage 1 through stage 4 chronic kidney disease, or unspecified chronic kidney disease: Secondary | ICD-10-CM | POA: Insufficient documentation

## 2022-05-10 DIAGNOSIS — H53149 Visual discomfort, unspecified: Secondary | ICD-10-CM | POA: Diagnosis not present

## 2022-05-10 DIAGNOSIS — R0789 Other chest pain: Secondary | ICD-10-CM | POA: Insufficient documentation

## 2022-05-10 DIAGNOSIS — R5383 Other fatigue: Secondary | ICD-10-CM | POA: Insufficient documentation

## 2022-05-10 DIAGNOSIS — R112 Nausea with vomiting, unspecified: Secondary | ICD-10-CM | POA: Diagnosis not present

## 2022-05-10 DIAGNOSIS — R519 Headache, unspecified: Secondary | ICD-10-CM | POA: Diagnosis not present

## 2022-05-10 DIAGNOSIS — R918 Other nonspecific abnormal finding of lung field: Secondary | ICD-10-CM | POA: Diagnosis not present

## 2022-05-10 DIAGNOSIS — R590 Localized enlarged lymph nodes: Secondary | ICD-10-CM | POA: Diagnosis not present

## 2022-05-10 DIAGNOSIS — E1010 Type 1 diabetes mellitus with ketoacidosis without coma: Secondary | ICD-10-CM | POA: Diagnosis not present

## 2022-05-10 DIAGNOSIS — R1084 Generalized abdominal pain: Secondary | ICD-10-CM | POA: Diagnosis not present

## 2022-05-10 DIAGNOSIS — R079 Chest pain, unspecified: Secondary | ICD-10-CM

## 2022-05-10 DIAGNOSIS — I700 Atherosclerosis of aorta: Secondary | ICD-10-CM | POA: Diagnosis not present

## 2022-05-10 DIAGNOSIS — I2510 Atherosclerotic heart disease of native coronary artery without angina pectoris: Secondary | ICD-10-CM | POA: Insufficient documentation

## 2022-05-10 DIAGNOSIS — N189 Chronic kidney disease, unspecified: Secondary | ICD-10-CM | POA: Diagnosis not present

## 2022-05-10 DIAGNOSIS — Z794 Long term (current) use of insulin: Secondary | ICD-10-CM | POA: Insufficient documentation

## 2022-05-10 DIAGNOSIS — R109 Unspecified abdominal pain: Secondary | ICD-10-CM

## 2022-05-10 DIAGNOSIS — K760 Fatty (change of) liver, not elsewhere classified: Secondary | ICD-10-CM | POA: Diagnosis not present

## 2022-05-10 DIAGNOSIS — R000 Tachycardia, unspecified: Secondary | ICD-10-CM | POA: Insufficient documentation

## 2022-05-10 LAB — CBC
HCT: 47.6 % — ABNORMAL HIGH (ref 36.0–46.0)
Hemoglobin: 16.1 g/dL — ABNORMAL HIGH (ref 12.0–15.0)
MCH: 29.2 pg (ref 26.0–34.0)
MCHC: 33.8 g/dL (ref 30.0–36.0)
MCV: 86.2 fL (ref 80.0–100.0)
Platelets: 359 10*3/uL (ref 150–400)
RBC: 5.52 MIL/uL — ABNORMAL HIGH (ref 3.87–5.11)
RDW: 14.9 % (ref 11.5–15.5)
WBC: 11.1 10*3/uL — ABNORMAL HIGH (ref 4.0–10.5)
nRBC: 0 % (ref 0.0–0.2)

## 2022-05-10 LAB — LACTIC ACID, PLASMA: Lactic Acid, Venous: 1.5 mmol/L (ref 0.5–1.9)

## 2022-05-10 LAB — COMPREHENSIVE METABOLIC PANEL
ALT: 13 U/L (ref 0–44)
AST: 13 U/L — ABNORMAL LOW (ref 15–41)
Albumin: 4.2 g/dL (ref 3.5–5.0)
Alkaline Phosphatase: 59 U/L (ref 38–126)
Anion gap: 11 (ref 5–15)
BUN: 23 mg/dL — ABNORMAL HIGH (ref 6–20)
CO2: 27 mmol/L (ref 22–32)
Calcium: 9.6 mg/dL (ref 8.9–10.3)
Chloride: 97 mmol/L — ABNORMAL LOW (ref 98–111)
Creatinine, Ser: 0.81 mg/dL (ref 0.44–1.00)
GFR, Estimated: >60 mL/min (ref >60)
Glucose, Bld: 292 mg/dL — ABNORMAL HIGH (ref 70–99)
Potassium: 4.2 mmol/L (ref 3.5–5.1)
Sodium: 135 mmol/L (ref 135–145)
Total Bilirubin: 0.9 mg/dL (ref 0.3–1.2)
Total Protein: 7.9 g/dL (ref 6.5–8.1)

## 2022-05-10 LAB — URINALYSIS, ROUTINE W REFLEX MICROSCOPIC
Bilirubin Urine: NEGATIVE
Glucose, UA: >=500 mg/dL — AB
Hgb urine dipstick: NEGATIVE
Ketones, ur: 20 mg/dL — AB
Leukocytes,Ua: NEGATIVE
Nitrite: NEGATIVE
Protein, ur: 30 mg/dL — AB
Specific Gravity, Urine: 1.024 (ref 1.005–1.030)
pH: 6 (ref 5.0–8.0)

## 2022-05-10 LAB — TROPONIN I (HIGH SENSITIVITY): Troponin I (High Sensitivity): 2 ng/L (ref <18)

## 2022-05-10 LAB — LIPASE, BLOOD: Lipase: 51 U/L (ref 11–51)

## 2022-05-10 LAB — CBG MONITORING, ED: Glucose-Capillary: 279 mg/dL — ABNORMAL HIGH (ref 70–99)

## 2022-05-10 MED ORDER — SODIUM CHLORIDE 0.9 % IV BOLUS
1000.0000 mL | Freq: Once | INTRAVENOUS | Status: AC
  Administered 2022-05-10: 1000 mL via INTRAVENOUS

## 2022-05-10 MED ORDER — FENTANYL CITRATE PF 50 MCG/ML IJ SOSY
50.0000 ug | PREFILLED_SYRINGE | Freq: Once | INTRAMUSCULAR | Status: AC
  Administered 2022-05-10: 50 ug via INTRAVENOUS
  Filled 2022-05-10: qty 1

## 2022-05-10 MED ORDER — IOHEXOL 350 MG/ML SOLN
100.0000 mL | Freq: Once | INTRAVENOUS | Status: AC | PRN
  Administered 2022-05-10: 100 mL via INTRAVENOUS

## 2022-05-10 MED ORDER — SODIUM CHLORIDE (PF) 0.9 % IJ SOLN
INTRAMUSCULAR | Status: AC
  Filled 2022-05-10: qty 50

## 2022-05-10 MED ORDER — KETOROLAC TROMETHAMINE 15 MG/ML IJ SOLN
15.0000 mg | Freq: Once | INTRAMUSCULAR | Status: AC
  Administered 2022-05-10: 15 mg via INTRAVENOUS
  Filled 2022-05-10: qty 1

## 2022-05-10 MED ORDER — DIPHENHYDRAMINE HCL 50 MG/ML IJ SOLN
50.0000 mg | Freq: Once | INTRAMUSCULAR | Status: AC
  Administered 2022-05-10: 50 mg via INTRAVENOUS
  Filled 2022-05-10: qty 1

## 2022-05-10 MED ORDER — PROCHLORPERAZINE EDISYLATE 10 MG/2ML IJ SOLN
10.0000 mg | Freq: Once | INTRAMUSCULAR | Status: AC
  Administered 2022-05-10: 10 mg via INTRAVENOUS
  Filled 2022-05-10: qty 2

## 2022-05-10 NOTE — ED Provider Triage Note (Signed)
Emergency Medicine Provider Triage Evaluation Note  [name] [name] , a 61 y.o. female  was evaluated in triage.  Pt complains of left-sided rib/abdominal pain that began yesterday.  She has associated headache and nausea that began about the same time as the symptoms.  States the pain feels sharp and stabbing in nature.  Headache is similar nature to headache she has had in the past, but its persistence brought her in the emergency department.  She is endorsing no neurological deficits at this point.  Is exacerbated with movement as well as taking a deep breath.  It is relieved with rest.  She denies shortness of breath, chest pain, vomiting, diarrhea, urinary/vaginal symptoms, change in bowel habits, fever, chills, night sweats.  Review of Systems  Positive: See above Negative:   Physical Exam  BP (!) 85/69 (BP Location: Right Arm)   Pulse (!) 101   Temp 98.6 F (37 C) (Oral)   Resp 16   LMP 08/11/2012   SpO2 97%  Gen:   Awake, no distress   Resp:  Normal effort  MSK:   Moves extremities without difficulty  Other:  Patient is diffusely tender abdomen with no 1 area worse than other.  She complains of left-sided CVA tenderness as well.  No overlying skin abnormalities noted.  Palpation of the left lower ribs endorsed no tenderness. Cranial nerves III through XII grossly intact.  Medical Decision Making  Medically screening exam initiated at 4:19 PM.  Appropriate orders placed.  [name] [name] was informed that the remainder of the evaluation will be completed by another provider, this initial triage assessment does not replace that evaluation, and the importance of remaining in the ED until their evaluation is complete.     Wilnette Kales, [place] 05/10/22 1624

## 2022-05-10 NOTE — Discharge Instructions (Signed)
Your history, exam and work-up today are consistent with persistent esophagitis/gastritis but the CT scan did not show evidence of perforation, abscess, or other acute changes today.  Your symptoms improved after the headache cocktail medications and given your reassuring vital signs and reassessment we feel you are safe for discharge home.  Please continue your GI medications and follow-up with your GI team.  Include all the other GI numbers as well.  Please rest and stay hydrated.  If any symptoms change or worsen acutely, please return to the nearest emergency department.

## 2022-05-10 NOTE — ED Notes (Signed)
Pt husband on the way to pick up pt.

## 2022-05-10 NOTE — ED Provider Notes (Signed)
[facility] DEPT Provider Note   CSN: [id number] Arrival date & time: 05/10/22  1555     History  Chief Complaint  Patient presents with   Flank Pain   Headache   Nausea    [name] [name] is a 61 y.o. female.  The history is provided by the patient and medical records. No language interpreter was used.  Flank Pain This is a new problem. The current episode started 2 days ago. The problem occurs constantly. The problem has not changed since onset.Associated symptoms include chest pain, abdominal pain and headaches. Pertinent negatives include no shortness of breath. Nothing aggravates the symptoms. Nothing relieves the symptoms. She has tried nothing for the symptoms. The treatment provided no relief.  Headache Pain location:  Generalized Quality:  Dull Chronicity:  Recurrent Context: bright light   Relieved by:  Nothing Associated symptoms: abdominal pain, fatigue, nausea, photophobia and vomiting   Associated symptoms: no back pain, no blurred vision, no congestion, no cough, no diarrhea, no dizziness, no fever, no loss of balance, no myalgias, no neck stiffness, no numbness, no paresthesias, no visual change and no weakness       Home Medications Prior to Admission medications   Medication Sig Start Date End Date Taking? Authorizing Provider  aspirin EC 81 MG tablet Take 81 mg by mouth daily. Swallow whole.    [provider]  atorvastatin (LIPITOR) 80 MG tablet Take 1 tablet (80 mg total) by mouth daily. 01/12/22   Bedsole, Amy E, MD  Continuous Blood Gluc Sensor (DEXCOM G6 SENSOR) MISC INJECT 1 SENSOR INTO SKIN EVERY 10 DAYS 07/05/21   Bedsole, Amy E, MD  Continuous Blood Gluc Transmit (DEXCOM G6 TRANSMITTER) MISC USE AS DIRECTED FOR CONTINUOUS GLUCOSE MONITORING. REUSE TRANSMITTER X 90DAYS THEN DISCARD & REPLACE 07/05/21   Bedsole, Amy E, MD  doxycycline (VIBRA-TABS) 100 MG tablet Take 1 tablet (100 mg total) by mouth 2 (two) times daily.  04/24/22   Bedsole, Amy E, MD  insulin lispro (HUMALOG KWIKPEN) 100 UNIT/ML KwikPen INJECT INTO SKIN 3 TIMES A DAY AT MEAL TIME ACCORDING TO THE FOLLOWING SLIDING SCALE: 27 UNITS MAX PER DAY DX E11.43 Patient taking differently: Inject 1-4 Units into the skin with breakfast, with lunch, and with evening meal. Per sliding scale. 02/26/22   Bedsole, Amy E, MD  Insulin Pen Needle (PEN NEEDLES) 31G X 8 MM MISC Use to inject insulin 4 times a day.  Dx: E11.10 07/21/21   Jinny Sanders, MD  Lancets (ONETOUCH ULTRASOFT) lancets Use to check blood sugar daily as needed.  Dx: E11.10 12/23/17   Jinny Sanders, MD  levothyroxine (SYNTHROID) 137 MCG tablet Take 959 mcg by mouth every Sunday. 03/19/22   [provider]  metoCLOPramide (REGLAN) 5 MG tablet Take 1 tablet (5 mg total) by mouth 4 (four) times daily -  before meals and at bedtime. 04/30/22   Milus Banister, MD  midodrine (PROAMATINE) 5 MG tablet Take 1 tablet (5 mg total) by mouth 2 (two) times daily with a meal. 03/02/22   Minus Breeding, MD  pantoprazole (PROTONIX) 40 MG tablet Take 1 tablet (40 mg total) by mouth 2 (two) times daily before a meal. 04/30/22   Milus Banister, MD  promethazine (PHENERGAN) 25 MG tablet Take 1 tablet (25 mg total) by mouth every 8 (eight) hours as needed for nausea or vomiting. 01/05/22   Bedsole, Amy E, MD  sucralfate (CARAFATE) 1 g tablet Take 1 tablet (1  g total) by mouth 4 (four) times daily. 03/29/22 03/29/23  Regalado, Belkys A, MD  TOUJEO SOLOSTAR 300 UNIT/ML Solostar Pen Inject 6 Units into the skin at bedtime. 03/29/22   Regalado, Jerald Kief A, MD      Allergies    Codeine    Review of Systems   Review of Systems  Constitutional:  Positive for fatigue. Negative for chills, diaphoresis and fever.  HENT:  Negative for congestion.   Eyes:  Positive for photophobia. Negative for blurred vision and visual disturbance.  Respiratory:  Negative for cough, chest tightness, shortness of breath and wheezing.    Cardiovascular:  Positive for chest pain. Negative for palpitations and leg swelling.  Gastrointestinal:  Positive for abdominal pain, nausea and vomiting. Negative for constipation and diarrhea.  Genitourinary:  Positive for flank pain. Negative for dysuria and frequency.  Musculoskeletal:  Negative for back pain, myalgias and neck stiffness.  Skin:  Negative for rash and wound.  Neurological:  Positive for headaches. Negative for dizziness, weakness, light-headedness, numbness, paresthesias and loss of balance.  Psychiatric/Behavioral:  Negative for agitation and confusion.   All other systems reviewed and are negative.  Physical Exam Updated Vital Signs BP (!) 150/112 (BP Location: Right Arm)   Pulse (!) 102   Temp 98.2 F (36.8 C) (Oral)   Resp 15   LMP 08/11/2012   SpO2 98%  Physical Exam Vitals and nursing note reviewed.  Constitutional:      General: She is not in acute distress.    Appearance: She is well-developed. She is not ill-appearing, toxic-appearing or diaphoretic.  HENT:     Head: Normocephalic and atraumatic.     Mouth/Throat:     Mouth: Mucous membranes are moist.  Eyes:     Extraocular Movements: Extraocular movements intact.     Conjunctiva/sclera: Conjunctivae normal.     Pupils: Pupils are equal, round, and reactive to light.  Cardiovascular:     Rate and Rhythm: Regular rhythm. Tachycardia present.     Heart sounds: No murmur heard. Pulmonary:     Effort: Pulmonary effort is normal. No respiratory distress.     Breath sounds: Normal breath sounds. No wheezing, rhonchi or rales.  Chest:     Chest wall: Tenderness present.  Abdominal:     General: Abdomen is flat.     Palpations: Abdomen is soft.     Tenderness: There is abdominal tenderness. There is no right CVA tenderness, left CVA tenderness, guarding or rebound.  Musculoskeletal:        General: No swelling.     Cervical back: Neck supple. No tenderness.  Skin:    General: Skin is warm and  dry.     Capillary Refill: Capillary refill takes less than 2 seconds.     Findings: No erythema or rash.  Neurological:     General: No focal deficit present.     Mental Status: She is alert.     Sensory: No sensory deficit.     Motor: No weakness.    ED Results / Procedures / Treatments   Labs (all labs ordered are listed, but only abnormal results are displayed) Labs Reviewed  COMPREHENSIVE METABOLIC PANEL - Abnormal; Notable for the following components:      Result Value   Chloride 97 (*)    Glucose, Bld 292 (*)    BUN 23 (*)    AST 13 (*)    All other components within normal limits  CBC - Abnormal; Notable for the following  components:   WBC 11.1 (*)    RBC 5.52 (*)    Hemoglobin 16.1 (*)    HCT 47.6 (*)    All other components within normal limits  URINALYSIS, ROUTINE W REFLEX MICROSCOPIC - Abnormal; Notable for the following components:   Glucose, UA >=500 (*)    Ketones, ur 20 (*)    Protein, ur 30 (*)    Bacteria, UA FEW (*)    All other components within normal limits  CBG MONITORING, ED - Abnormal; Notable for the following components:   Glucose-Capillary 279 (*)    All other components within normal limits  LIPASE, BLOOD  LACTIC ACID, PLASMA  TROPONIN I (HIGH SENSITIVITY)    EKG EKG Interpretation  Date/Time:  Thursday May 10 2022 16:07:30 EDT Ventricular Rate:  100 PR Interval:  155 QRS Duration: 114 QT Interval:  339 QTC Calculation: 438 R Axis:   92 Text Interpretation: Sinus tachycardia Atrial premature complex Probable left atrial enlargement Borderline intraventricular conduction delay Artifact in lead(s) I II III aVR aVL aVF V2 when compared to prior, similar appearance. No STEMI Confirmed by Antony Blackbird [phone]) on 05/10/2022 7:36:20 PM  Radiology DG Chest 2 View  Result Date: 05/10/2022 CLINICAL DATA:  Rib/flank pain. EXAM: CHEST - 2 VIEW COMPARISON:  None Available. FINDINGS: The heart size and mediastinal contours are within normal  limits. Both lungs are clear. The visualized skeletal structures are unremarkable. IMPRESSION: No active cardiopulmonary disease. Electronically Signed   By: Keane Police D.O.   On: 05/10/2022 16:33   CT Angio Chest PE W and/or Wo Contrast  Result Date: 05/10/2022 CLINICAL DATA:  Pulmonary embolism (PE) suspected, high prob Severe pleuritic right-sided chest pain and abdominal pain. EXAM: CT ANGIOGRAPHY CHEST WITH CONTRAST TECHNIQUE: Multidetector CT imaging of the chest was performed using the standard protocol during bolus administration of intravenous contrast. Multiplanar CT image reconstructions and MIPs were obtained to evaluate the vascular anatomy. Performed in conjunction with CT of the abdomen and pelvis, reported separately. RADIATION DOSE REDUCTION: This exam was performed according to the departmental dose-optimization program which includes automated exposure control, adjustment of the mA and/or kV according to patient size and/or use of iterative reconstruction technique. CONTRAST:  134m OMNIPAQUE IOHEXOL 350 MG/ML SOLN COMPARISON:  Radiograph earlier today. FINDINGS: Cardiovascular: There are no filling defects within the pulmonary arteries to suggest pulmonary embolus. Mild atherosclerosis of the thoracic aorta. No aortic aneurysm or acute aortic findings. The heart is normal in size. No pericardial effusion. Mediastinum/Nodes: No mediastinal adenopathy or mass. There is mild thickening of the distal esophagus. Upper esophagus is patulous. No thyroid nodule. Lungs/Pleura: No focal airspace disease. No pleural effusion. There is mild bronchial thickening. No pulmonary mass or nodule. Upper Abdomen: Assessed on concurrent abdominal CT, reported separately. Musculoskeletal: There are no acute or suspicious osseous abnormalities. Chest wall soft tissue abnormalities. Review of the MIP images confirms the above findings. IMPRESSION: 1. No pulmonary embolus. 2. Mild bronchial thickening, can be seen  with bronchitis or reactive airways disease. 3. Mild distal esophageal thickening, possibly reflux or esophagitis. Aortic Atherosclerosis (ICD10-I70.0). Electronically Signed   By: MKeith RakeM.D.   On: 05/10/2022 20:33   CT ABDOMEN PELVIS W CONTRAST  Result Date: 05/10/2022 CLINICAL DATA:  Abdominal pain, acute, nonlocalized EXAM: CT ABDOMEN AND PELVIS WITH CONTRAST TECHNIQUE: Multidetector CT imaging of the abdomen and pelvis was performed using the standard protocol following bolus administration of intravenous contrast. Performed in conjunction with CTA of the chest, reported  separately. RADIATION DOSE REDUCTION: This exam was performed according to the departmental dose-optimization program which includes automated exposure control, adjustment of the mA and/or kV according to patient size and/or use of iterative reconstruction technique. CONTRAST:  1106m OMNIPAQUE IOHEXOL 350 MG/ML SOLN COMPARISON:  CT 01/17/2022 FINDINGS: Lower chest: Assessed on concurrent chest CTA, reported separately. Hepatobiliary: Diffuse hepatic steatosis. More focal fatty infiltration adjacent to the falciform ligament. No discrete liver lesion. Gallbladder physiologically distended, no calcified stone. No biliary dilatation. Pancreas: Unremarkable. No pancreatic ductal dilatation or surrounding inflammatory changes. Spleen: Normal in size without focal abnormality. Adrenals/Urinary Tract: Normal adrenal glands. No hydronephrosis or perinephric edema. Homogeneous renal enhancement with symmetric excretion on delayed phase imaging. No renal calculi or focal renal abnormality. Moderate urinary bladder wall thickening. No perivesicular edema or fat stranding. Stomach/Bowel: Distal esophageal wall thickening. Fluid/ingested material within the stomach. Mild pre pyloric gastric wall thickening, series 4, image 29. The edema adjacent to the distal stomach on prior CT has resolved. No small bowel obstruction. Small bowel  inflammation. Appendicolith versus high-density ingested material at the base of the appendix which is otherwise normal. No appendicitis. Multifocal colonic diverticulosis. No diverticulitis or acute colonic inflammation. Vascular/Lymphatic: Aortic atherosclerosis. No aortic aneurysm. Patent portal, splenic, and mesenteric veins. No abdominopelvic adenopathy. Reproductive: Status post hysterectomy. No adnexal masses. Other: Small fat containing umbilical hernia. No ascites or free air. No abdominopelvic collection Musculoskeletal: There are no acute or suspicious osseous abnormalities. IMPRESSION: 1. Moderate urinary bladder wall thickening, query urinary tract infection. Recommend correlation with urinalysis. 2. Mild pre pyloric gastric wall thickening, can be seen with gastritis or peptic ulcer disease. The edema adjacent to the distal stomach on prior exam has resolved. 3. Hepatic steatosis. 4. Colonic diverticulosis without diverticulitis. Aortic Atherosclerosis (ICD10-I70.0). Electronically Signed   By: MKeith RakeM.D.   On: 05/10/2022 20:39    Procedures Procedures    Medications Ordered in ED Medications  sodium chloride (PF) 0.9 % injection (  Not Given 05/10/22 2045)  prochlorperazine (COMPAZINE) injection 10 mg (10 mg Intravenous Given 05/10/22 2010)  diphenhydrAMINE (BENADRYL) injection 50 mg (50 mg Intravenous Given 05/10/22 2012)  ketorolac (TORADOL) 15 MG/ML injection 15 mg (15 mg Intravenous Given 05/10/22 2007)  fentaNYL (SUBLIMAZE) injection 50 mcg (50 mcg Intravenous Given 05/10/22 2009)  sodium chloride 0.9 % bolus 1,000 mL (1,000 mLs Intravenous New Bag/Given 05/10/22 2008)  iohexol (OMNIPAQUE) 350 MG/ML injection 100 mL (100 mLs Intravenous Contrast Given 05/10/22 2016)    ED Course/ Medical Decision Making/ A&P                           Medical Decision Making Amount and/or Complexity of Data Reviewed Labs: ordered. Radiology: ordered.  Risk Prescription drug  management.    LCHERIA SADIQis a 61y.o. female with a past medical history significant for GERD, type 1 diabetes with previous DKA, CHF, hypertension, hyperlipidemia, aneurysm, CKD, vertigo, migraines, CAD with previous NSTEMI, paroxysmal atrial fibrillation not on anticoagulation, and recent GI note describing suspected diabetic gastroparesis who presents with several days of worsening diffuse abdominal pain, nausea, vomiting, pleuritic right chest pain, and migrainous type headaches.  Patient denies fevers or chills and denies any constipation, diarrhea, or urinary changes.  She reports has had severe right-sided pleuritic sharp chest pain with deep breathing for the last few days.  Patient is tachycardic and tachypneic on arrival but is not hypoxic or febrile.  She is also describing severe  abdominal pain that is greater than 10 out of 10 all across her abdomen with nausea and vomiting.  Does not report any blood in her emesis.  No trauma reported.  No focal neurologic complaints.  Denies any neck pain or neck stiffness.  Reports headache does have photophobia and phonophobia similar to prior migraines.  On exam, lungs were clear.  Right chest was tender.  Did not see a rash to suggest shingles.  Abdomen was diffusely tender.  Bowel sounds were appreciated.  Flanks nontender.  Patient moving all extremities with no focal neurologic deficits.  Pupils are symmetric and reactive normal extraocular movements.  Symmetric smile.  EKG does not show STEMI and appears similar to prior.  Given the patient's headache similar to prior migraines with similar symptoms, patient agrees to hold on CT imaging of her head however given the diffuse worsened abdominal pain and previous CT abdomen last year showing some edema and wall thickening in the stomach and bowel, we will get repeat CT abdomen pelvis to look for complications such as obstruction or abscess or perforation or other abnormality.  With the extremely  pleuritic sharp right-sided chest discomfort with deep breathing and her tachycardia and vitals, we will get a CT PE study as well.  We will give combination of headache cocktail and some pain medicine.  We will give some fluids as she does appear dehydrated.  Patient had some screening lab work initially that did not show DKA.  Anticipate reassessment after work-up to determine disposition.  We will continue work-up to rule out life-threatening etiology.  10:56 PM Work-up returned overall reassuring.  After headache cocktail headache has completely resolved and she is not vomiting or having significant pain further.  Her CT scan of the chest showed no PE.  Her abdomen and pelvis CT scan showed possible gastritis or esophagitis.  No perforation or abscess seen.  In previous improved in regards to edema than prior.  Rest of work-up also reassuring and blood pressure is improved.  Clinically I suspect she still has some chronic GI troubles with gastritis.  Patient said that she will call her GI team for further evaluation and recommendations.  She is already on omeprazole and will continue this.  Given her lack of other findings and reassuring examination we feel she is safe for discharge home.  Patient was discharged for outpatient follow-up.  She understands return precautions.        Final Clinical Impression(s) / ED Diagnoses Final diagnoses:  Chest pain, unspecified type  Acute nonintractable headache, unspecified headache type  Nausea and vomiting, unspecified vomiting type  Abdominal pain, unspecified abdominal location    Rx / DC Orders ED Discharge Orders     None       Clinical Impression: 1. Chest pain, unspecified type   2. Acute nonintractable headache, unspecified headache type   3. Nausea and vomiting, unspecified vomiting type   4. Abdominal pain, unspecified abdominal location     Disposition: Discharge  Condition: Good  I have discussed the results, Dx  and Tx plan with the pt(& family if present). He/she/they expressed understanding and agree(s) with the plan. Discharge instructions discussed at great length. Strict return precautions discussed and pt &/or family have verbalized understanding of the instructions. No further questions at time of discharge.    New Prescriptions   No medications on file    Follow Up: Gastroenterology, [address] [place] [address] [phone]     [facility] [place] [address] [phone]         Cerinity Zynda, Gwenyth Allegra, MD 05/10/22 2259

## 2022-05-10 NOTE — ED Notes (Signed)
I provided reinforced discharge education based off of discharge instructions. Pt acknowledged and understood my education. Pt had no further questions/concerns for provider/myself.  °

## 2022-05-10 NOTE — ED Triage Notes (Signed)
Pt reports rib/flank pain. Pt also reports headache and nausea since yesterday. Denies CP. States she took 2 baby ASA for pain.

## 2022-05-13 ENCOUNTER — Other Ambulatory Visit: Payer: Self-pay

## 2022-05-13 ENCOUNTER — Encounter (HOSPITAL_BASED_OUTPATIENT_CLINIC_OR_DEPARTMENT_OTHER): Payer: Self-pay

## 2022-05-13 ENCOUNTER — Emergency Department (HOSPITAL_BASED_OUTPATIENT_CLINIC_OR_DEPARTMENT_OTHER)
Admission: EM | Admit: 2022-05-13 | Discharge: 2022-05-13 | Disposition: A | Discharge status: Home / Self Care | Payer: BC Managed Care – PPO | Attending: Emergency Medicine | Admitting: Emergency Medicine

## 2022-05-13 DIAGNOSIS — R739 Hyperglycemia, unspecified: Secondary | ICD-10-CM

## 2022-05-13 DIAGNOSIS — I509 Heart failure, unspecified: Secondary | ICD-10-CM | POA: Insufficient documentation

## 2022-05-13 DIAGNOSIS — E1165 Type 2 diabetes mellitus with hyperglycemia: Secondary | ICD-10-CM | POA: Insufficient documentation

## 2022-05-13 DIAGNOSIS — R112 Nausea with vomiting, unspecified: Secondary | ICD-10-CM | POA: Diagnosis not present

## 2022-05-13 DIAGNOSIS — Z794 Long term (current) use of insulin: Secondary | ICD-10-CM | POA: Diagnosis not present

## 2022-05-13 DIAGNOSIS — Z7982 Long term (current) use of aspirin: Secondary | ICD-10-CM | POA: Insufficient documentation

## 2022-05-13 DIAGNOSIS — Z79899 Other long term (current) drug therapy: Secondary | ICD-10-CM | POA: Diagnosis not present

## 2022-05-13 DIAGNOSIS — I110 Hypertensive heart disease with heart failure: Secondary | ICD-10-CM | POA: Insufficient documentation

## 2022-05-13 DIAGNOSIS — R000 Tachycardia, unspecified: Secondary | ICD-10-CM | POA: Insufficient documentation

## 2022-05-13 LAB — BASIC METABOLIC PANEL
Anion gap: 17 — ABNORMAL HIGH (ref 5–15)
BUN: 19 mg/dL (ref 6–20)
CO2: 29 mmol/L (ref 22–32)
Calcium: 10 mg/dL (ref 8.9–10.3)
Chloride: 91 mmol/L — ABNORMAL LOW (ref 98–111)
Creatinine, Ser: 0.69 mg/dL (ref 0.44–1.00)
GFR, Estimated: >60 mL/min (ref >60)
Glucose, Bld: 461 mg/dL — ABNORMAL HIGH (ref 70–99)
Potassium: 3.6 mmol/L (ref 3.5–5.1)
Sodium: 137 mmol/L (ref 135–145)

## 2022-05-13 LAB — URINALYSIS, ROUTINE W REFLEX MICROSCOPIC
Bilirubin Urine: NEGATIVE
Glucose, UA: >1000 mg/dL — AB
Hgb urine dipstick: NEGATIVE
Ketones, ur: 15 mg/dL — AB
Leukocytes,Ua: NEGATIVE
Nitrite: NEGATIVE
Specific Gravity, Urine: 1.022 (ref 1.005–1.030)
pH: 7.5 (ref 5.0–8.0)

## 2022-05-13 LAB — HEPATIC FUNCTION PANEL
ALT: 8 U/L (ref 0–44)
AST: 9 U/L — ABNORMAL LOW (ref 15–41)
Albumin: 4.6 g/dL (ref 3.5–5.0)
Alkaline Phosphatase: 56 U/L (ref 38–126)
Bilirubin, Direct: 0.1 mg/dL (ref 0.0–0.2)
Indirect Bilirubin: 0.3 mg/dL (ref 0.3–0.9)
Total Bilirubin: 0.4 mg/dL (ref 0.3–1.2)
Total Protein: 7.7 g/dL (ref 6.5–8.1)

## 2022-05-13 LAB — CBG MONITORING, ED
Glucose-Capillary: 448 mg/dL — ABNORMAL HIGH (ref 70–99)
Glucose-Capillary: 424 mg/dL — ABNORMAL HIGH (ref 70–99)
Glucose-Capillary: 286 mg/dL — ABNORMAL HIGH (ref 70–99)

## 2022-05-13 LAB — CBC
HCT: 46.4 % — ABNORMAL HIGH (ref 36.0–46.0)
Hemoglobin: 15.7 g/dL — ABNORMAL HIGH (ref 12.0–15.0)
MCH: 28.6 pg (ref 26.0–34.0)
MCHC: 33.8 g/dL (ref 30.0–36.0)
MCV: 84.7 fL (ref 80.0–100.0)
Platelets: 355 10*3/uL (ref 150–400)
RBC: 5.48 MIL/uL — ABNORMAL HIGH (ref 3.87–5.11)
RDW: 14.7 % (ref 11.5–15.5)
WBC: 12.6 10*3/uL — ABNORMAL HIGH (ref 4.0–10.5)
nRBC: 0 % (ref 0.0–0.2)

## 2022-05-13 LAB — LIPASE, BLOOD: Lipase: 40 U/L (ref 11–51)

## 2022-05-13 MED ORDER — ONDANSETRON 4 MG PO TBDP
ORAL_TABLET | ORAL | Status: AC
  Filled 2022-05-13: qty 1

## 2022-05-13 MED ORDER — INSULIN ASPART 100 UNIT/ML IJ SOLN
5.0000 [IU] | Freq: Once | INTRAMUSCULAR | Status: AC
  Administered 2022-05-13: 5 [IU] via SUBCUTANEOUS

## 2022-05-13 MED ORDER — PROCHLORPERAZINE EDISYLATE 10 MG/2ML IJ SOLN
10.0000 mg | Freq: Once | INTRAMUSCULAR | Status: AC
  Administered 2022-05-13: 10 mg via INTRAVENOUS
  Filled 2022-05-13: qty 2

## 2022-05-13 MED ORDER — PANTOPRAZOLE SODIUM 40 MG IV SOLR
40.0000 mg | Freq: Once | INTRAVENOUS | Status: AC
  Administered 2022-05-13: 40 mg via INTRAVENOUS
  Filled 2022-05-13: qty 10

## 2022-05-13 MED ORDER — SODIUM CHLORIDE 0.9 % IV BOLUS
1000.0000 mL | Freq: Once | INTRAVENOUS | Status: AC
  Administered 2022-05-13: 1000 mL via INTRAVENOUS

## 2022-05-13 MED ORDER — ONDANSETRON 4 MG PO TBDP: 4.0000 mg | ORAL_TABLET | Freq: Once | ORAL | Status: AC

## 2022-05-13 NOTE — ED Provider Notes (Signed)
[place] EMERGENCY DEPT Provider Note   CSN: [id number] Arrival date & time: 05/13/22  0809     History  Chief Complaint  Patient presents with   Emesis    [name] [name] is a 61 y.o. female.  Patient is a 61 year old female who presents with vomiting.  She has a history of diabetes, IBS, hypertension, CHF, GERD.  She is followed by GI and there is a questionable history of gastroparesis.  She says she started having some vomiting yesterday.  Its been nonbloody and nonbilious.  No associated diarrhea.  She has not really been able to keep anything down since yesterday.  She has tried Zofran with no improvement.  She says Phenergan does not work either.  She has had similar symptoms in the past.  She was recently seen on June 1 at [facility].  Chart was reviewed.  She had a CT scan at that time which showed some possible gastritis.  No other acute process.  She denies any fevers.  No cough or cold symptoms.  No urinary symptoms.  She is not reporting much abdominal pain.      Home Medications Prior to Admission medications   Medication Sig Start Date End Date Taking? Authorizing Provider  aspirin EC 81 MG tablet Take 81 mg by mouth daily. Swallow whole.    [provider]  atorvastatin (LIPITOR) 80 MG tablet Take 1 tablet (80 mg total) by mouth daily. 01/12/22   Bedsole, Amy E, MD  Continuous Blood Gluc Sensor (DEXCOM G6 SENSOR) MISC INJECT 1 SENSOR INTO SKIN EVERY 10 DAYS 07/05/21   Bedsole, Amy E, MD  Continuous Blood Gluc Transmit (DEXCOM G6 TRANSMITTER) MISC USE AS DIRECTED FOR CONTINUOUS GLUCOSE MONITORING. REUSE TRANSMITTER X 90DAYS THEN DISCARD & REPLACE 07/05/21   Bedsole, Amy E, MD  doxycycline (VIBRA-TABS) 100 MG tablet Take 1 tablet (100 mg total) by mouth 2 (two) times daily. 04/24/22   Bedsole, Amy E, MD  insulin lispro (HUMALOG KWIKPEN) 100 UNIT/ML KwikPen INJECT INTO SKIN 3 TIMES A DAY AT MEAL TIME ACCORDING TO THE FOLLOWING SLIDING SCALE: 27 UNITS MAX  PER DAY DX E11.43 Patient taking differently: Inject 1-4 Units into the skin with breakfast, with lunch, and with evening meal. Per sliding scale. 02/26/22   Bedsole, Amy E, MD  Insulin Pen Needle (PEN NEEDLES) 31G X 8 MM MISC Use to inject insulin 4 times a day.  Dx: E11.10 07/21/21   Jinny Sanders, MD  Lancets (ONETOUCH ULTRASOFT) lancets Use to check blood sugar daily as needed.  Dx: E11.10 12/23/17   Jinny Sanders, MD  levothyroxine (SYNTHROID) 137 MCG tablet Take 959 mcg by mouth every Sunday. 03/19/22   [provider]  metoCLOPramide (REGLAN) 5 MG tablet Take 1 tablet (5 mg total) by mouth 4 (four) times daily -  before meals and at bedtime. 04/30/22   Milus Banister, MD  midodrine (PROAMATINE) 5 MG tablet Take 1 tablet (5 mg total) by mouth 2 (two) times daily with a meal. 03/02/22   Minus Breeding, MD  pantoprazole (PROTONIX) 40 MG tablet Take 1 tablet (40 mg total) by mouth 2 (two) times daily before a meal. 04/30/22   Milus Banister, MD  promethazine (PHENERGAN) 25 MG tablet Take 1 tablet (25 mg total) by mouth every 8 (eight) hours as needed for nausea or vomiting. 01/05/22   Bedsole, Amy E, MD  sucralfate (CARAFATE) 1 g tablet Take 1 tablet (1 g total) by mouth 4 (four)  times daily. 03/29/22 03/29/23  Regalado, Belkys A, MD  TOUJEO SOLOSTAR 300 UNIT/ML Solostar Pen Inject 6 Units into the skin at bedtime. 03/29/22   Regalado, Jerald Kief A, MD      Allergies    Codeine    Review of Systems   Review of Systems  Constitutional:  Positive for fatigue. Negative for chills, diaphoresis and fever.  HENT:  Negative for congestion, rhinorrhea and sneezing.   Eyes: Negative.   Respiratory:  Negative for cough, chest tightness and shortness of breath.   Cardiovascular:  Negative for chest pain and leg swelling.  Gastrointestinal:  Positive for nausea and vomiting. Negative for abdominal pain, blood in stool and diarrhea.  Genitourinary:  Negative for difficulty urinating, flank pain,  frequency and hematuria.  Musculoskeletal:  Negative for arthralgias and back pain.  Skin:  Negative for rash.  Neurological:  Negative for dizziness, speech difficulty, weakness, numbness and headaches.   Physical Exam Updated Vital Signs BP 95/66   Pulse 88   Temp 98.6 F (37 C) (Oral)   Resp 18   LMP 08/11/2012   SpO2 100%  Physical Exam Constitutional:      Appearance: She is well-developed.  HENT:     Head: Normocephalic and atraumatic.  Eyes:     Pupils: Pupils are equal, round, and reactive to light.  Cardiovascular:     Rate and Rhythm: Regular rhythm. Tachycardia present.     Heart sounds: Normal heart sounds.  Pulmonary:     Effort: Pulmonary effort is normal. No respiratory distress.     Breath sounds: Normal breath sounds. No wheezing or rales.  Chest:     Chest wall: No tenderness.  Abdominal:     General: Bowel sounds are normal.     Palpations: Abdomen is soft.     Tenderness: There is no abdominal tenderness. There is no guarding or rebound.  Musculoskeletal:        General: Normal range of motion.     Cervical back: Normal range of motion and neck supple.  Lymphadenopathy:     Cervical: No cervical adenopathy.  Skin:    General: Skin is warm and dry.     Findings: No rash.  Neurological:     Mental Status: She is alert and oriented to person, place, and time.    ED Results / Procedures / Treatments   Labs (all labs ordered are listed, but only abnormal results are displayed) Labs Reviewed  BASIC METABOLIC PANEL - Abnormal; Notable for the following components:      Result Value   Chloride 91 (*)    Glucose, Bld 461 (*)    Anion gap 17 (*)    All other components within normal limits  CBC - Abnormal; Notable for the following components:   WBC 12.6 (*)    RBC 5.48 (*)    Hemoglobin 15.7 (*)    HCT 46.4 (*)    All other components within normal limits  URINALYSIS, ROUTINE W REFLEX MICROSCOPIC - Abnormal; Notable for the following components:    Color, Urine COLORLESS (*)    Glucose, UA >1,000 (*)    Ketones, ur 15 (*)    Protein, ur TRACE (*)    Bacteria, UA RARE (*)    All other components within normal limits  HEPATIC FUNCTION PANEL - Abnormal; Notable for the following components:   AST 9 (*)    All other components within normal limits  CBG MONITORING, ED - Abnormal; Notable for the following  components:   Glucose-Capillary 448 (*)    All other components within normal limits  CBG MONITORING, ED - Abnormal; Notable for the following components:   Glucose-Capillary 424 (*)    All other components within normal limits  CBG MONITORING, ED - Abnormal; Notable for the following components:   Glucose-Capillary 286 (*)    All other components within normal limits  LIPASE, BLOOD    EKG None  Radiology No results found.  Procedures Procedures    Medications Ordered in ED Medications  ondansetron (ZOFRAN-ODT) disintegrating tablet 4 mg ( Oral Given 05/13/22 0926)  prochlorperazine (COMPAZINE) injection 10 mg (10 mg Intravenous Given 05/13/22 0905)  sodium chloride 0.9 % bolus 1,000 mL ( Intravenous Stopped 05/13/22 0932)  pantoprazole (PROTONIX) injection 40 mg (40 mg Intravenous Given 05/13/22 0904)  insulin aspart (novoLOG) injection 5 Units (5 Units Subcutaneous Given 05/13/22 1032)  sodium chloride 0.9 % bolus 1,000 mL (1,000 mLs Intravenous New Bag/Given 05/13/22 1416)    ED Course/ Medical Decision Making/ A&P                           Medical Decision Making Amount and/or Complexity of Data Reviewed External Data Reviewed: radiology and notes. Labs: ordered. Decision-making details documented in ED Course. ECG/medicine tests: ordered and independent interpretation performed. Decision-making details documented in ED Course.  Risk Parenteral controlled substances. Decision regarding hospitalization.   Patient is a 61 year old female who presents with nausea and vomiting.  She has had a similar history of this in  the past.  She has been diagnosed with possible gastroparesis although she had a recent CT scan that also showed gastritis.  She was treated with IV fluids and antiemetics as well as Protonix.  She had some labs checked that showed an elevated glucose slightly over 400.  Her anion gap was slightly elevated but her bicarb was normal.  I feel this is unlikely to be DKA.  The mild anion gap is likely from her vomiting and dehydration.  Given no suggestions of acidosis and rapid clinical improvement, I doubt this is DKA.  She was given IV fluids and subcu insulin for 1 dose.  Her glucose improved in the 200 range after this.  There is no suggestions of pancreatitis.  No suggestions of hepatitis.  She does not have any ongoing abdominal pain.  There is no signs of urinary tract infection.  She status post hysterectomy so urine pregnancy was not performed.  She has had recent imaging of her abdomen.  I reviewed the studies.  I do not feel that this needs to be repeated today given her lack of abdominal pain.  She improved with treatment in the ED.  She is able to drink without any nausea or vomiting.  Her blood pressure was low at 1 point in the upper 90s.  However she was given IV fluids and this improved.  On discharge on my reevaluation, her blood pressure was 158/95.  She is not tachycardic.  She is otherwise well-appearing.  She says she is ready to go home.  Given her improvement, I do not feel that she needs inpatient hospitalization.  I feel that she can do a trial of outpatient treatment.  I encouraged her to have close follow-up with her PCP.  I encouraged her to monitor her blood sugars closely.  Return precautions were given.  She has antiemetics at home to use.  Final Clinical Impression(s) / ED Diagnoses Final diagnoses:  Hyperglycemia  Nausea and vomiting, unspecified vomiting type    Rx / DC Orders ED Discharge Orders     None         Malvin Johns, MD 05/13/22 1506

## 2022-05-13 NOTE — Discharge Instructions (Signed)
Monitor your blood sugars closely at home.  Return to emergency room if you have any worsening symptoms.

## 2022-05-13 NOTE — ED Notes (Signed)
Tolerating fluids.

## 2022-05-13 NOTE — ED Notes (Signed)
Cbg 424, reported to Dr. Tamera Punt

## 2022-05-13 NOTE — ED Triage Notes (Signed)
She states she has "been vomiting everything I take in since yesterday". She states that she is not having any pain today. She further tells [place] that she was just seen (by Dr. Sherry Ruffing) in hospital in the E.D., but that the pain she had had then has resolved.

## 2022-05-13 NOTE — ED Notes (Signed)
Pt states cannot void right now, is too dehydrated and feels to bad to get oob. Will monitor, ivf infusing now and antiemetic given.

## 2022-05-13 NOTE — ED Notes (Signed)
Water given  

## 2022-05-25 ENCOUNTER — Telehealth: Payer: Self-pay | Admitting: Pharmacy Technician

## 2022-05-25 ENCOUNTER — Other Ambulatory Visit (HOSPITAL_COMMUNITY): Payer: Self-pay

## 2022-05-25 NOTE — Telephone Encounter (Signed)
Patient Advocate Encounter  Received notification from [place] that prior authorization for PANTOPRAZOLE '40MG'$  is required.   PA submitted on 6.16.23 Key [id number]  Status is pending    Luciano Cutter, CPhT Patient Advocate Phone: [phone]

## 2022-06-06 ENCOUNTER — Encounter (HOSPITAL_BASED_OUTPATIENT_CLINIC_OR_DEPARTMENT_OTHER): Payer: Self-pay | Admitting: Emergency Medicine

## 2022-06-06 ENCOUNTER — Other Ambulatory Visit: Payer: Self-pay

## 2022-06-06 ENCOUNTER — Emergency Department (HOSPITAL_BASED_OUTPATIENT_CLINIC_OR_DEPARTMENT_OTHER)
Admission: EM | Admit: 2022-06-06 | Discharge: 2022-06-06 | Disposition: A | Discharge status: Home / Self Care | Payer: BC Managed Care – PPO | Attending: Emergency Medicine | Admitting: Emergency Medicine

## 2022-06-06 DIAGNOSIS — G43009 Migraine without aura, not intractable, without status migrainosus: Secondary | ICD-10-CM | POA: Diagnosis not present

## 2022-06-06 DIAGNOSIS — E1165 Type 2 diabetes mellitus with hyperglycemia: Secondary | ICD-10-CM | POA: Insufficient documentation

## 2022-06-06 DIAGNOSIS — G43909 Migraine, unspecified, not intractable, without status migrainosus: Secondary | ICD-10-CM | POA: Diagnosis not present

## 2022-06-06 DIAGNOSIS — Z7982 Long term (current) use of aspirin: Secondary | ICD-10-CM | POA: Diagnosis not present

## 2022-06-06 DIAGNOSIS — Z794 Long term (current) use of insulin: Secondary | ICD-10-CM | POA: Diagnosis not present

## 2022-06-06 LAB — BASIC METABOLIC PANEL
Anion gap: 15 (ref 5–15)
BUN: 22 mg/dL — ABNORMAL HIGH (ref 6–20)
CO2: 29 mmol/L (ref 22–32)
Calcium: 10 mg/dL (ref 8.9–10.3)
Chloride: 86 mmol/L — ABNORMAL LOW (ref 98–111)
Creatinine, Ser: 0.86 mg/dL (ref 0.44–1.00)
GFR, Estimated: >60 mL/min (ref >60)
Glucose, Bld: 433 mg/dL — ABNORMAL HIGH (ref 70–99)
Potassium: 3.4 mmol/L — ABNORMAL LOW (ref 3.5–5.1)
Sodium: 130 mmol/L — ABNORMAL LOW (ref 135–145)

## 2022-06-06 LAB — CBC WITH DIFFERENTIAL/PLATELET
Abs Immature Granulocytes: 0.06 10*3/uL (ref 0.00–0.07)
Basophils Absolute: 0 10*3/uL (ref 0.0–0.1)
Basophils Relative: 0 %
Eosinophils Absolute: 0.1 10*3/uL (ref 0.0–0.5)
Eosinophils Relative: 0 %
HCT: 44 % (ref 36.0–46.0)
Hemoglobin: 15.3 g/dL — ABNORMAL HIGH (ref 12.0–15.0)
Immature Granulocytes: 0 %
Lymphocytes Relative: 19 %
Lymphs Abs: 2.6 10*3/uL (ref 0.7–4.0)
MCH: 28.7 pg (ref 26.0–34.0)
MCHC: 34.8 g/dL (ref 30.0–36.0)
MCV: 82.4 fL (ref 80.0–100.0)
Monocytes Absolute: 1.2 10*3/uL — ABNORMAL HIGH (ref 0.1–1.0)
Monocytes Relative: 9 %
Neutro Abs: 9.6 10*3/uL — ABNORMAL HIGH (ref 1.7–7.7)
Neutrophils Relative %: 72 %
Platelets: 383 10*3/uL (ref 150–400)
RBC: 5.34 MIL/uL — ABNORMAL HIGH (ref 3.87–5.11)
RDW: 14.5 % (ref 11.5–15.5)
WBC: 13.5 10*3/uL — ABNORMAL HIGH (ref 4.0–10.5)
nRBC: 0 % (ref 0.0–0.2)

## 2022-06-06 MED ORDER — DIPHENHYDRAMINE HCL 50 MG/ML IJ SOLN
12.5000 mg | Freq: Once | INTRAMUSCULAR | Status: AC
  Administered 2022-06-06: 12.5 mg via INTRAVENOUS
  Filled 2022-06-06: qty 1

## 2022-06-06 MED ORDER — METOCLOPRAMIDE HCL 5 MG/ML IJ SOLN
10.0000 mg | Freq: Once | INTRAMUSCULAR | Status: AC
  Administered 2022-06-06: 10 mg via INTRAVENOUS
  Filled 2022-06-06: qty 2

## 2022-06-06 MED ORDER — SODIUM CHLORIDE 0.9 % IV BOLUS
1000.0000 mL | Freq: Once | INTRAVENOUS | Status: AC
  Administered 2022-06-06: 1000 mL via INTRAVENOUS

## 2022-06-06 MED ORDER — KETOROLAC TROMETHAMINE 30 MG/ML IJ SOLN
30.0000 mg | Freq: Once | INTRAMUSCULAR | Status: AC
  Administered 2022-06-06: 30 mg via INTRAVENOUS

## 2022-06-06 NOTE — ED Triage Notes (Signed)
Pt presents with a migraine x2 days. Hx of the same, last one was 5 months ago, this one is associated with nausea.   Pt ambulatory in triage w/a steady gate, a/o x4

## 2022-06-06 NOTE — ED Provider Notes (Signed)
[place] EMERGENCY DEPT Provider Note   CSN: [id number] Arrival date & time: 06/06/22  6644     History  Chief Complaint  Patient presents with   Migraine   Nausea    [name] [name] is a 61 y.o. female.  He has a history significant for diabetes.  Complaining of a migraine headache that started yesterday.  Its sharp stabbing in nature generalized.  Associated with nausea and vomiting.  Has tried nothing for it.  Last blood sugar was 300.  No blurry vision double vision numbness or weakness.  No chest pain or shortness of breath.  No fevers or chills.  Headache is typical for her migraines.  The history is provided by the patient.  Migraine This is a recurrent problem. The current episode started yesterday. The problem has not changed since onset.Associated symptoms include headaches. Pertinent negatives include no chest pain, no abdominal pain and no shortness of breath. Nothing aggravates the symptoms. Nothing relieves the symptoms. She has tried rest for the symptoms. The treatment provided no relief.       Home Medications Prior to Admission medications   Medication Sig Start Date End Date Taking? Authorizing Provider  aspirin EC 81 MG tablet Take 81 mg by mouth daily. Swallow whole.    [provider]  atorvastatin (LIPITOR) 80 MG tablet Take 1 tablet (80 mg total) by mouth daily. 01/12/22   Bedsole, Amy E, MD  Continuous Blood Gluc Sensor (DEXCOM G6 SENSOR) MISC INJECT 1 SENSOR INTO SKIN EVERY 10 DAYS 07/05/21   Bedsole, Amy E, MD  Continuous Blood Gluc Transmit (DEXCOM G6 TRANSMITTER) MISC USE AS DIRECTED FOR CONTINUOUS GLUCOSE MONITORING. REUSE TRANSMITTER X 90DAYS THEN DISCARD & REPLACE 07/05/21   Bedsole, Amy E, MD  doxycycline (VIBRA-TABS) 100 MG tablet Take 1 tablet (100 mg total) by mouth 2 (two) times daily. 04/24/22   Bedsole, Amy E, MD  insulin lispro (HUMALOG KWIKPEN) 100 UNIT/ML KwikPen INJECT INTO SKIN 3 TIMES A DAY AT MEAL TIME ACCORDING TO THE  FOLLOWING SLIDING SCALE: 27 UNITS MAX PER DAY DX E11.43 Patient taking differently: Inject 1-4 Units into the skin with breakfast, with lunch, and with evening meal. Per sliding scale. 02/26/22   Bedsole, Amy E, MD  Insulin Pen Needle (PEN NEEDLES) 31G X 8 MM MISC Use to inject insulin 4 times a day.  Dx: E11.10 07/21/21   Jinny Sanders, MD  Lancets (ONETOUCH ULTRASOFT) lancets Use to check blood sugar daily as needed.  Dx: E11.10 12/23/17   Jinny Sanders, MD  levothyroxine (SYNTHROID) 137 MCG tablet Take 959 mcg by mouth every Sunday. 03/19/22   [provider]  metoCLOPramide (REGLAN) 5 MG tablet Take 1 tablet (5 mg total) by mouth 4 (four) times daily -  before meals and at bedtime. 04/30/22   Milus Banister, MD  midodrine (PROAMATINE) 5 MG tablet Take 1 tablet (5 mg total) by mouth 2 (two) times daily with a meal. 03/02/22   Minus Breeding, MD  pantoprazole (PROTONIX) 40 MG tablet Take 1 tablet (40 mg total) by mouth 2 (two) times daily before a meal. 04/30/22   Milus Banister, MD  promethazine (PHENERGAN) 25 MG tablet Take 1 tablet (25 mg total) by mouth every 8 (eight) hours as needed for nausea or vomiting. 01/05/22   Bedsole, Amy E, MD  sucralfate (CARAFATE) 1 g tablet Take 1 tablet (1 g total) by mouth 4 (four) times daily. 03/29/22 03/29/23  Regalado, Cassie Freer, MD  TOUJEO SOLOSTAR 300 UNIT/ML Solostar Pen Inject 6 Units into the skin at bedtime. 03/29/22   Regalado, Cassie Freer, MD      Allergies    Codeine    Review of Systems   Review of Systems  Constitutional:  Negative for fever.  HENT:  Negative for sore throat.   Eyes:  Negative for visual disturbance.  Respiratory:  Negative for shortness of breath.   Cardiovascular:  Negative for chest pain.  Gastrointestinal:  Positive for nausea and vomiting. Negative for abdominal pain.  Genitourinary:  Negative for dysuria.  Musculoskeletal:  Negative for neck pain.  Skin:  Negative for rash.  Neurological:  Positive for headaches.  Negative for speech difficulty, weakness and numbness.    Physical Exam Updated Vital Signs BP 125/85 (BP Location: Right Arm)   Pulse 98   Temp 98.3 F (36.8 C) (Oral)   Resp 16   LMP 08/11/2012   SpO2 99%  Physical Exam Vitals and nursing note reviewed.  Constitutional:      General: She is not in acute distress.    Appearance: Normal appearance. She is well-developed.  HENT:     Head: Normocephalic and atraumatic.  Eyes:     Conjunctiva/sclera: Conjunctivae normal.  Cardiovascular:     Rate and Rhythm: Normal rate and regular rhythm.     Heart sounds: No murmur heard. Pulmonary:     Effort: Pulmonary effort is normal. No respiratory distress.     Breath sounds: Normal breath sounds.  Abdominal:     Palpations: Abdomen is soft.     Tenderness: There is no abdominal tenderness. There is no guarding or rebound.  Musculoskeletal:     Cervical back: Neck supple. No tenderness.     Right lower leg: No edema.     Left lower leg: No edema.  Skin:    General: Skin is warm and dry.     Capillary Refill: Capillary refill takes less than 2 seconds.  Neurological:     General: No focal deficit present.     Mental Status: She is alert and oriented to person, place, and time.     Cranial Nerves: No cranial nerve deficit.     Sensory: No sensory deficit.     Motor: No weakness.     ED Results / Procedures / Treatments   Labs (all labs ordered are listed, but only abnormal results are displayed) Labs Reviewed  BASIC METABOLIC PANEL - Abnormal; Notable for the following components:      Result Value   Sodium 130 (*)    Potassium 3.4 (*)    Chloride 86 (*)    Glucose, Bld 433 (*)    BUN 22 (*)    All other components within normal limits  CBC WITH DIFFERENTIAL/PLATELET - Abnormal; Notable for the following components:   WBC 13.5 (*)    RBC 5.34 (*)    Hemoglobin 15.3 (*)    Neutro Abs 9.6 (*)    Monocytes Absolute 1.2 (*)    All other components within normal limits     EKG None  Radiology No results found.  Procedures Procedures    Medications Ordered in ED Medications  sodium chloride 0.9 % bolus 1,000 mL (has no administration in time range)  ketorolac (TORADOL) 30 MG/ML injection 30 mg (has no administration in time range)  metoCLOPramide (REGLAN) injection 10 mg (has no administration in time range)  diphenhydrAMINE (BENADRYL) injection 12.5 mg (has no administration in time range)  ED Course/ Medical Decision Making/ A&P Clinical Course as of 06/06/22 1803  Wed Jun 06, 2022  0855 Reevaluated patient after medications and she is sleeping soundly. [MB]  [id number] Reassessed patient, she said her headache is resolved.  She has a ride home.  She would like to be discharged so she can go rest in bed.  I did let her know about her elevated blood sugar and the need to hydrate and continue her regular medications. [MB]    Clinical Course User Index [MB] Hayden Rasmussen, MD                           Medical Decision Making Amount and/or Complexity of Data Reviewed Labs: ordered.  Risk Prescription drug management.  This patient complains of migraine headache nausea vomiting; this involves an extensive number of treatment Options and is a complaint that carries with it a high risk of complications and morbidity. The differential includes migraine, DKA, hyperglycemia, dehydration  I ordered, reviewed and interpreted labs, which included CBC with mildly elevated white count stable hemoglobin, chemistries with low sodium pseudohyponatremia, mildly low potassium, elevated glucose, normal I ordered medication IV fluids Toradol Reglan Benadryl with improvement in her headache and reviewed PMP when indicated. Previous records obtained and reviewed in epic, patient has been seen before for elevated blood sugars and migraines  Cardiac monitoring reviewed, normal sinus rhythm Social determinants considered, no significant barriers Critical  Interventions: None  After the interventions stated above, I reevaluated the patient and found patient's headache to be improved Admission and further testing considered, no indications for admission or further work-up at this time.  Recommended close follow-up with PCP.  Return instructions discussed          Final Clinical Impression(s) / ED Diagnoses Final diagnoses:  Migraine without aura and without status migrainosus, not intractable  Hyperglycemia due to diabetes mellitus [facility])    Rx / DC Orders ED Discharge Orders     None         Hayden Rasmussen, MD 06/06/22 [phone]

## 2022-06-06 NOTE — ED Notes (Signed)
ED, Melina Copa at bedside.

## 2022-06-06 NOTE — ED Notes (Signed)
Discharge instructions and follow up care reviewed and explained, pt verbalized understanding and had no further questions on d/c. Pt caox4 and ambulatory on departure.  

## 2022-06-06 NOTE — Discharge Instructions (Addendum)
You were seen in the emergency department for migraine headache.  You received medication with improvement in your symptoms.  Please rest and drink plenty of fluids.  Your blood sugar was elevated here and will be important for you to take your diabetic medications.  Please follow-up with your primary care doctor.  Return if any worsening or concerning symptoms.

## 2022-06-14 ENCOUNTER — Other Ambulatory Visit (HOSPITAL_COMMUNITY): Payer: Self-pay

## 2022-06-14 NOTE — Telephone Encounter (Signed)
Patient Advocate Encounter  Prior Authorization for PANTOPRAZOLE '40MG'$  has been approved.    PA# [id number] Effective dates: 05/25/2022 through 05/24/2023  Aspasia Rude B. CPhT P: [phone] F: [phone]

## 2022-06-16 NOTE — Assessment & Plan Note (Signed)
Chronic, poor control  Needs better control for wound healing.  This was discussed in detail with patient.

## 2022-06-16 NOTE — Assessment & Plan Note (Signed)
Acute, Elevate feet above heart. When wound improved wear compression hose. Can use Voltaren gel OTC on knees for pain  We will work on wound care referral ASAP for longterm management of the wound. Start antibiotics orally.

## 2022-06-26 ENCOUNTER — Emergency Department (HOSPITAL_COMMUNITY)
Admission: EM | Admit: 2022-06-26 | Discharge: 2022-06-26 | Disposition: A | Discharge status: Home / Self Care | Payer: BC Managed Care – PPO | Attending: Emergency Medicine | Admitting: Emergency Medicine

## 2022-06-26 ENCOUNTER — Other Ambulatory Visit: Payer: Self-pay

## 2022-06-26 ENCOUNTER — Encounter (HOSPITAL_COMMUNITY): Payer: Self-pay | Admitting: Emergency Medicine

## 2022-06-26 DIAGNOSIS — E1022 Type 1 diabetes mellitus with diabetic chronic kidney disease: Secondary | ICD-10-CM | POA: Diagnosis not present

## 2022-06-26 DIAGNOSIS — I130 Hypertensive heart and chronic kidney disease with heart failure and stage 1 through stage 4 chronic kidney disease, or unspecified chronic kidney disease: Secondary | ICD-10-CM | POA: Insufficient documentation

## 2022-06-26 DIAGNOSIS — E1065 Type 1 diabetes mellitus with hyperglycemia: Secondary | ICD-10-CM | POA: Diagnosis not present

## 2022-06-26 DIAGNOSIS — R1084 Generalized abdominal pain: Secondary | ICD-10-CM | POA: Insufficient documentation

## 2022-06-26 DIAGNOSIS — Z794 Long term (current) use of insulin: Secondary | ICD-10-CM | POA: Insufficient documentation

## 2022-06-26 DIAGNOSIS — E1165 Type 2 diabetes mellitus with hyperglycemia: Secondary | ICD-10-CM

## 2022-06-26 DIAGNOSIS — D75839 Thrombocytosis, unspecified: Secondary | ICD-10-CM | POA: Insufficient documentation

## 2022-06-26 DIAGNOSIS — N182 Chronic kidney disease, stage 2 (mild): Secondary | ICD-10-CM | POA: Insufficient documentation

## 2022-06-26 DIAGNOSIS — R000 Tachycardia, unspecified: Secondary | ICD-10-CM | POA: Insufficient documentation

## 2022-06-26 DIAGNOSIS — R197 Diarrhea, unspecified: Secondary | ICD-10-CM | POA: Diagnosis not present

## 2022-06-26 DIAGNOSIS — D582 Other hemoglobinopathies: Secondary | ICD-10-CM | POA: Diagnosis not present

## 2022-06-26 DIAGNOSIS — R1010 Upper abdominal pain, unspecified: Secondary | ICD-10-CM | POA: Diagnosis not present

## 2022-06-26 DIAGNOSIS — D72829 Elevated white blood cell count, unspecified: Secondary | ICD-10-CM | POA: Diagnosis not present

## 2022-06-26 DIAGNOSIS — I509 Heart failure, unspecified: Secondary | ICD-10-CM | POA: Diagnosis not present

## 2022-06-26 DIAGNOSIS — R739 Hyperglycemia, unspecified: Secondary | ICD-10-CM | POA: Diagnosis not present

## 2022-06-26 DIAGNOSIS — R1013 Epigastric pain: Secondary | ICD-10-CM | POA: Diagnosis not present

## 2022-06-26 LAB — I-STAT VENOUS BLOOD GAS, ED
Acid-Base Excess: 4 mmol/L — ABNORMAL HIGH (ref 0.0–2.0)
Bicarbonate: 27.8 mmol/L (ref 20.0–28.0)
Calcium, Ion: 1.02 mmol/L — ABNORMAL LOW (ref 1.15–1.40)
HCT: 48 % — ABNORMAL HIGH (ref 36.0–46.0)
Hemoglobin: 16.3 g/dL — ABNORMAL HIGH (ref 12.0–15.0)
O2 Saturation: 95 %
Potassium: 4.1 mmol/L (ref 3.5–5.1)
Sodium: 134 mmol/L — ABNORMAL LOW (ref 135–145)
TCO2: 29 mmol/L (ref 22–32)
pCO2, Ven: 36.4 mmHg — ABNORMAL LOW (ref 44–60)
pH, Ven: 7.491 — ABNORMAL HIGH (ref 7.25–7.43)
pO2, Ven: 70 mmHg — ABNORMAL HIGH (ref 32–45)

## 2022-06-26 LAB — LIPASE, BLOOD: Lipase: 30 U/L (ref 11–51)

## 2022-06-26 LAB — CBG MONITORING, ED
Glucose-Capillary: 445 mg/dL — ABNORMAL HIGH (ref 70–99)
Glucose-Capillary: 454 mg/dL — ABNORMAL HIGH (ref 70–99)
Glucose-Capillary: 348 mg/dL — ABNORMAL HIGH (ref 70–99)
Glucose-Capillary: 312 mg/dL — ABNORMAL HIGH (ref 70–99)
Glucose-Capillary: 313 mg/dL — ABNORMAL HIGH (ref 70–99)
Glucose-Capillary: 220 mg/dL — ABNORMAL HIGH (ref 70–99)

## 2022-06-26 LAB — COMPREHENSIVE METABOLIC PANEL
ALT: 14 U/L (ref 0–44)
AST: 13 U/L — ABNORMAL LOW (ref 15–41)
Albumin: 3.7 g/dL (ref 3.5–5.0)
Alkaline Phosphatase: 79 U/L (ref 38–126)
Anion gap: 16 — ABNORMAL HIGH (ref 5–15)
BUN: 10 mg/dL (ref 6–20)
CO2: 25 mmol/L (ref 22–32)
Calcium: 9.1 mg/dL (ref 8.9–10.3)
Chloride: 94 mmol/L — ABNORMAL LOW (ref 98–111)
Creatinine, Ser: 0.76 mg/dL (ref 0.44–1.00)
GFR, Estimated: >60 mL/min (ref >60)
Glucose, Bld: 458 mg/dL — ABNORMAL HIGH (ref 70–99)
Potassium: 3.7 mmol/L (ref 3.5–5.1)
Sodium: 135 mmol/L (ref 135–145)
Total Bilirubin: 0.9 mg/dL (ref 0.3–1.2)
Total Protein: 7 g/dL (ref 6.5–8.1)

## 2022-06-26 LAB — CBC
HCT: 44.1 % (ref 36.0–46.0)
Hemoglobin: 15.3 g/dL — ABNORMAL HIGH (ref 12.0–15.0)
MCH: 30 pg (ref 26.0–34.0)
MCHC: 34.7 g/dL (ref 30.0–36.0)
MCV: 86.5 fL (ref 80.0–100.0)
Platelets: 480 10*3/uL — ABNORMAL HIGH (ref 150–400)
RBC: 5.1 MIL/uL (ref 3.87–5.11)
RDW: 14.6 % (ref 11.5–15.5)
WBC: 11.8 10*3/uL — ABNORMAL HIGH (ref 4.0–10.5)
nRBC: 0 % (ref 0.0–0.2)

## 2022-06-26 LAB — I-STAT BETA HCG BLOOD, ED (MC, WL, AP ONLY): I-stat hCG, quantitative: <5 m[IU]/mL (ref <5)

## 2022-06-26 MED ORDER — ACETAMINOPHEN 500 MG PO TABS
1000.0000 mg | ORAL_TABLET | Freq: Once | ORAL | Status: AC
  Administered 2022-06-26: 1000 mg via ORAL
  Filled 2022-06-26: qty 2

## 2022-06-26 MED ORDER — INSULIN ASPART 100 UNIT/ML IJ SOLN
3.0000 [IU] | Freq: Once | INTRAMUSCULAR | Status: AC
  Administered 2022-06-26: 3 [IU] via SUBCUTANEOUS

## 2022-06-26 MED ORDER — SODIUM CHLORIDE 0.9 % IV BOLUS
1000.0000 mL | Freq: Once | INTRAVENOUS | Status: AC
  Administered 2022-06-26: 1000 mL via INTRAVENOUS

## 2022-06-26 MED ORDER — METOCLOPRAMIDE HCL 5 MG/ML IJ SOLN
5.0000 mg | Freq: Once | INTRAMUSCULAR | Status: AC
  Administered 2022-06-26: 5 mg via INTRAVENOUS
  Filled 2022-06-26: qty 2

## 2022-06-26 MED ORDER — ONDANSETRON HCL 4 MG/2ML IJ SOLN
4.0000 mg | Freq: Once | INTRAMUSCULAR | Status: AC
  Administered 2022-06-26: 4 mg via INTRAVENOUS
  Filled 2022-06-26: qty 2

## 2022-06-26 NOTE — ED Provider Notes (Signed)
[facility] Provider Note   CSN: [id number] Arrival date & time: 06/26/22  3536     History  Chief Complaint  Patient presents with   Abdominal Pain    [name] [name] is a 61 y.o. female.  With past medical history of type 1 diabetes uncontrolled complicated by DKA, gastroparesis, GERD, hypertension, hyperlipidemia, CHF, stage II kidney disease who presents to the emergency department with abdominal pain.  States she has had sharp, diffuse abdominal pain that has been constant since around 230 this morning.  She states that she has also had multiple episodes of throwing up and diarrhea.  She states that she has generally been feeling unwell over the past few days and eating less.  She endorses having polydipsia and polyuria.  States that she has been taking her insulin as prescribed.  She denies any cough, fevers, chest pain, palpitations, shortness of breath, dysuria, vaginal discharge, chills, diaphoresis.  Denies sick contacts or known contaminated foods.   Abdominal Pain Associated symptoms: diarrhea, nausea and vomiting   Associated symptoms: no dysuria and no fever        Home Medications Prior to Admission medications   Medication Sig Start Date End Date Taking? Authorizing Provider  aspirin EC 81 MG tablet Take 81 mg by mouth daily. Swallow whole.    [provider]  atorvastatin (LIPITOR) 80 MG tablet Take 1 tablet (80 mg total) by mouth daily. 01/12/22   Bedsole, Amy E, MD  Continuous Blood Gluc Sensor (DEXCOM G6 SENSOR) MISC INJECT 1 SENSOR INTO SKIN EVERY 10 DAYS 07/05/21   Bedsole, Amy E, MD  Continuous Blood Gluc Transmit (DEXCOM G6 TRANSMITTER) MISC USE AS DIRECTED FOR CONTINUOUS GLUCOSE MONITORING. REUSE TRANSMITTER X 90DAYS THEN DISCARD & REPLACE 07/05/21   Bedsole, Amy E, MD  doxycycline (VIBRA-TABS) 100 MG tablet Take 1 tablet (100 mg total) by mouth 2 (two) times daily. 04/24/22   Bedsole, Amy E, MD  insulin lispro  (HUMALOG KWIKPEN) 100 UNIT/ML KwikPen INJECT INTO SKIN 3 TIMES A DAY AT MEAL TIME ACCORDING TO THE FOLLOWING SLIDING SCALE: 27 UNITS MAX PER DAY DX E11.43 Patient taking differently: Inject 1-4 Units into the skin with breakfast, with lunch, and with evening meal. Per sliding scale. 02/26/22   Bedsole, Amy E, MD  Insulin Pen Needle (PEN NEEDLES) 31G X 8 MM MISC Use to inject insulin 4 times a day.  Dx: E11.10 07/21/21   Jinny Sanders, MD  Lancets (ONETOUCH ULTRASOFT) lancets Use to check blood sugar daily as needed.  Dx: E11.10 12/23/17   Jinny Sanders, MD  levothyroxine (SYNTHROID) 137 MCG tablet Take 959 mcg by mouth every Sunday. 03/19/22   [provider]  metoCLOPramide (REGLAN) 5 MG tablet Take 1 tablet (5 mg total) by mouth 4 (four) times daily -  before meals and at bedtime. 04/30/22   Milus Banister, MD  midodrine (PROAMATINE) 5 MG tablet Take 1 tablet (5 mg total) by mouth 2 (two) times daily with a meal. 03/02/22   Minus Breeding, MD  pantoprazole (PROTONIX) 40 MG tablet Take 1 tablet (40 mg total) by mouth 2 (two) times daily before a meal. 04/30/22   Milus Banister, MD  promethazine (PHENERGAN) 25 MG tablet Take 1 tablet (25 mg total) by mouth every 8 (eight) hours as needed for nausea or vomiting. 01/05/22   Bedsole, Amy E, MD  sucralfate (CARAFATE) 1 g tablet Take 1 tablet (1 g total) by mouth 4 (four)  times daily. 03/29/22 03/29/23  Regalado, Belkys A, MD  TOUJEO SOLOSTAR 300 UNIT/ML Solostar Pen Inject 6 Units into the skin at bedtime. 03/29/22   Regalado, Jerald Kief A, MD      Allergies    Codeine    Review of Systems   Review of Systems  Constitutional:  Positive for appetite change. Negative for fever.  Gastrointestinal:  Positive for abdominal pain, diarrhea, nausea and vomiting.  Endocrine: Positive for polydipsia and polyuria.  Genitourinary:  Negative for dysuria.  All other systems reviewed and are negative.   Physical Exam Updated Vital Signs BP (!) 155/102 (BP  Location: Right Arm)   Pulse (!) 121   Temp 98.4 F (36.9 C) (Oral)   Resp 19   LMP 08/11/2012   SpO2 99%  Physical Exam Vitals and nursing note reviewed.  Constitutional:      General: She is not in acute distress.    Appearance: Normal appearance. She is ill-appearing. She is not toxic-appearing.  HENT:     Head: Normocephalic and atraumatic.     Mouth/Throat:     Mouth: Mucous membranes are dry.     Pharynx: Oropharynx is clear.  Eyes:     General: No scleral icterus.    Extraocular Movements: Extraocular movements intact.     Pupils: Pupils are equal, round, and reactive to light.  Cardiovascular:     Rate and Rhythm: Regular rhythm. Tachycardia present.     Heart sounds: Normal heart sounds.  Pulmonary:     Effort: Pulmonary effort is normal. No respiratory distress.     Breath sounds: Normal breath sounds.  Abdominal:     General: Abdomen is protuberant. Bowel sounds are decreased. There is no distension.     Palpations: Abdomen is soft.     Tenderness: There is generalized abdominal tenderness. There is no guarding.  Musculoskeletal:        General: Normal range of motion.     Cervical back: Neck supple.  Skin:    General: Skin is warm and dry.     Capillary Refill: Capillary refill takes less than 2 seconds.  Neurological:     General: No focal deficit present.     Mental Status: She is alert and oriented to person, place, and time. Mental status is at baseline.  Psychiatric:        Mood and Affect: Mood normal.        Behavior: Behavior normal.        Thought Content: Thought content normal.        Judgment: Judgment normal.     ED Results / Procedures / Treatments   Labs (all labs ordered are listed, but only abnormal results are displayed) Labs Reviewed  COMPREHENSIVE METABOLIC PANEL - Abnormal; Notable for the following components:      Result Value   Chloride 94 (*)    Glucose, Bld 458 (*)    AST 13 (*)    Anion gap 16 (*)    All other components  within normal limits  CBC - Abnormal; Notable for the following components:   WBC 11.8 (*)    Hemoglobin 15.3 (*)    Platelets 480 (*)    All other components within normal limits  CBG MONITORING, ED - Abnormal; Notable for the following components:   Glucose-Capillary 445 (*)    All other components within normal limits  I-STAT VENOUS BLOOD GAS, ED - Abnormal; Notable for the following components:   pH, Ven 7.491 (*)  pCO2, Ven 36.4 (*)    pO2, Ven 70 (*)    Acid-Base Excess 4.0 (*)    Sodium 134 (*)    Calcium, Ion 1.02 (*)    HCT 48.0 (*)    Hemoglobin 16.3 (*)    All other components within normal limits  CBG MONITORING, ED - Abnormal; Notable for the following components:   Glucose-Capillary 454 (*)    All other components within normal limits  CBG MONITORING, ED - Abnormal; Notable for the following components:   Glucose-Capillary 348 (*)    All other components within normal limits  CBG MONITORING, ED - Abnormal; Notable for the following components:   Glucose-Capillary 312 (*)    All other components within normal limits  CBG MONITORING, ED - Abnormal; Notable for the following components:   Glucose-Capillary 313 (*)    All other components within normal limits  CBG MONITORING, ED - Abnormal; Notable for the following components:   Glucose-Capillary 220 (*)    All other components within normal limits  LIPASE, BLOOD  URINALYSIS, ROUTINE W REFLEX MICROSCOPIC  I-STAT BETA HCG BLOOD, ED ([facility], [facility], [facility] ONLY)   EKG EKG Interpretation  Date/Time:  Tuesday June 26 2022 08:28:39 EDT Ventricular Rate:  107 PR Interval:  158 QRS Duration: 84 QT Interval:  352 QTC Calculation: 469 R Axis:   96 Text Interpretation: Sinus tachycardia with Premature atrial complexes Possible Left atrial enlargement Confirmed by Godfrey Pick ([id number]) on 06/26/2022 4:20:44 PM  Radiology No results found.  Procedures Procedures   Medications Ordered in ED Medications  sodium chloride 0.9 %  bolus 1,000 mL (0 mLs Intravenous Stopped 06/26/22 1813)  metoCLOPramide (REGLAN) injection 5 mg (5 mg Intravenous Given 06/26/22 1617)  acetaminophen (TYLENOL) tablet 1,000 mg (1,000 mg Oral Given 06/26/22 1840)  insulin aspart (novoLOG) injection 3 Units (3 Units Subcutaneous Given 06/26/22 1939)  ondansetron (ZOFRAN) injection 4 mg (4 mg Intravenous Given 06/26/22 2036)  insulin aspart (novoLOG) injection 3 Units (3 Units Subcutaneous Given 06/26/22 2048)    ED Course/ Medical Decision Making/ A&P                           Medical Decision Making Amount and/or Complexity of Data Reviewed Labs: ordered.  Risk OTC drugs. Prescription drug management.  This patient presents to the ED for concern of abdominal pain and hyperglycemia, this involves an extensive number of treatment options, and is a complaint that carries with it a high risk of complications and morbidity.  The differential diagnosis includes DKA, hyperglycemia, HHS, pancreatitis, acute hepatobiliary disease, gastroparesis, etc.   Co morbidities that complicate the patient evaluation Diabetes  Additional history obtained:  Additional history obtained from: Husband at bedside External records from outside source obtained and reviewed including: Most recent ED physician note  EKG: EKG: sinus tachycardia.   Cardiac Monitoring: The patient was maintained on a cardiac monitor.  I personally viewed and interpreted the cardiac monitored which showed an underlying rhythm of: Sinus rhythm  Lab Results: I personally ordered, reviewed, and interpreted labs. Pertinent results include: CMP with glucose of 458, anion gap of 16, potassium of 3.7 Not pregnant CBC with a hemoglobin of 15.3, platelets 480, white blood cells 11.8, likely hemoconcentrated Lipase 30, negative Venous gas with respiratory alkalosis.  Not acidotic  Medications  I ordered medication including Reglan and Zofran for nausea, 1 L IV fluids for dehydration, 1 g  Tylenol for headache, 6 units of NovoLog  for hyperglycemia Reevaluation of the patient after medication shows that patient resolved -I reviewed the patient's home medications and did not make adjustments. -I did not prescribe new home medications.  Tests Considered: Consider CT abdomen pelvis, however consider this low yield at this time  Critical Interventions: Insulin  Consultations: N/A  SDH None identified  ED Course:  Patient presents with abdominal pain, nausea and vomiting which is likely secondary to her hyperglycemia.  Presents with hyperglycemia to 458, nausea, vomiting, polyuria and polydipsia consistent with hyperglycemia.  She does have mild anion gap but she is not acidotic.  No potassium or sodium abnormalities.  She does appear to be somewhat dehydrated from her hemoconcentrated CBC. She was given Reglan and Zofran for her nausea and vomiting which resolved her symptoms.  She was also given a liter of fluids for dehydration and temp to bring down her blood glucose.  After giving her a liter of fluid she went from 458 to 348.  I then gave her 3 units of NovoLog which decreased her blood glucose to 313.  She then received 3 more units of NovoLog which decreased her blood glucose to 220.  She tolerated p.o. without recurrence of nausea and vomiting.  Her abdominal pain nausea and vomiting are less likely to be from esophageal dysmotility.  She has no chest pain.  EKG without ischemia or infarction so I have low suspicion for ACS.  Her lipase is also negative, doubt pancreatitis at this time.  Her symptoms are not consistent with appendicitis, biliary pathology or other emergent problem.  Her symptoms have essentially resolved here in the emergency department.  Do not feel that she needs to be admitted for DKA work-up and insulin drip.  She has access to her insulin at home.  She understands to eat and take her insulin as prescribed.  Instructed to follow-up with her  endocrinologist.  She verbalized understanding.  After consideration of the diagnostic results and the patients response to treatment, I feel that the patent would benefit from discharge. The patient has been appropriately medically screened and/or stabilized in the ED. I have low suspicion for any other emergent medical condition which would require further screening, evaluation or treatment in the ED or require inpatient management. The patient is overall well appearing and non-toxic in appearance. They are hemodynamically stable at time of discharge.   Final Clinical Impression(s) / ED Diagnoses Final diagnoses:  Hyperglycemia due to diabetes mellitus ([place])  Generalized abdominal pain    Rx / DC Orders ED Discharge Orders     None         Mickie Hillier, PA-C 06/26/22 2219    Godfrey Pick, MD 06/27/22 [phone]

## 2022-06-26 NOTE — ED Notes (Signed)
Cannot get urine

## 2022-06-26 NOTE — Discharge Instructions (Addendum)
You are seen in the emergency department today for abdominal pain related to your elevated blood glucose.  We were able to get your blood glucose back to a more normal area and stop your nausea and vomiting.  Please eat and drink a gentle diet and continue taking your Humalog and long-acting insulin as prescribed.  Please return if you are unable to keep liquids down.

## 2022-06-26 NOTE — ED Triage Notes (Signed)
Pt to triage via [facility] from home.  Reports upper abd pain, nausea, vomiting, and diarrhea since 2:30am.  Enroute to ED pt closed her eyes and started shaking both arms as if she was having a seizure.  EMS told her to stop and she did.    EMS- CBG 465- took 4 units Humalog prior to leaving home IV- 20g LFA NS 265m

## 2022-07-05 ENCOUNTER — Ambulatory Visit: Payer: BC Managed Care – PPO | Admitting: Family Medicine

## 2022-07-05 ENCOUNTER — Other Ambulatory Visit (HOSPITAL_COMMUNITY): Payer: Self-pay

## 2022-07-06 ENCOUNTER — Ambulatory Visit: Payer: BC Managed Care – PPO | Admitting: Family Medicine

## 2022-07-16 DIAGNOSIS — M7731 Calcaneal spur, right foot: Secondary | ICD-10-CM | POA: Diagnosis not present

## 2022-07-16 DIAGNOSIS — X58XXXA Exposure to other specified factors, initial encounter: Secondary | ICD-10-CM | POA: Diagnosis not present

## 2022-07-16 DIAGNOSIS — K920 Hematemesis: Secondary | ICD-10-CM | POA: Diagnosis not present

## 2022-07-16 DIAGNOSIS — E039 Hypothyroidism, unspecified: Secondary | ICD-10-CM | POA: Diagnosis not present

## 2022-07-16 DIAGNOSIS — I4891 Unspecified atrial fibrillation: Secondary | ICD-10-CM | POA: Diagnosis not present

## 2022-07-16 DIAGNOSIS — R531 Weakness: Secondary | ICD-10-CM | POA: Diagnosis not present

## 2022-07-16 DIAGNOSIS — E1110 Type 2 diabetes mellitus with ketoacidosis without coma: Secondary | ICD-10-CM | POA: Diagnosis not present

## 2022-07-16 DIAGNOSIS — I5032 Chronic diastolic (congestive) heart failure: Secondary | ICD-10-CM | POA: Diagnosis not present

## 2022-07-16 DIAGNOSIS — E11621 Type 2 diabetes mellitus with foot ulcer: Secondary | ICD-10-CM | POA: Diagnosis not present

## 2022-07-16 DIAGNOSIS — I110 Hypertensive heart disease with heart failure: Secondary | ICD-10-CM | POA: Diagnosis not present

## 2022-07-16 DIAGNOSIS — M2041 Other hammer toe(s) (acquired), right foot: Secondary | ICD-10-CM | POA: Diagnosis not present

## 2022-07-16 DIAGNOSIS — K2091 Esophagitis, unspecified with bleeding: Secondary | ICD-10-CM | POA: Diagnosis not present

## 2022-07-16 DIAGNOSIS — K209 Esophagitis, unspecified without bleeding: Secondary | ICD-10-CM | POA: Diagnosis not present

## 2022-07-16 DIAGNOSIS — L97519 Non-pressure chronic ulcer of other part of right foot with unspecified severity: Secondary | ICD-10-CM | POA: Diagnosis not present

## 2022-07-16 DIAGNOSIS — K589 Irritable bowel syndrome without diarrhea: Secondary | ICD-10-CM | POA: Diagnosis not present

## 2022-07-16 DIAGNOSIS — E785 Hyperlipidemia, unspecified: Secondary | ICD-10-CM | POA: Diagnosis not present

## 2022-07-16 DIAGNOSIS — S91301A Unspecified open wound, right foot, initial encounter: Secondary | ICD-10-CM | POA: Diagnosis not present

## 2022-07-16 DIAGNOSIS — Z886 Allergy status to analgesic agent status: Secondary | ICD-10-CM | POA: Diagnosis not present

## 2022-07-16 DIAGNOSIS — E1169 Type 2 diabetes mellitus with other specified complication: Secondary | ICD-10-CM | POA: Diagnosis not present

## 2022-07-16 DIAGNOSIS — E1010 Type 1 diabetes mellitus with ketoacidosis without coma: Secondary | ICD-10-CM | POA: Diagnosis not present

## 2022-07-16 DIAGNOSIS — F1290 Cannabis use, unspecified, uncomplicated: Secondary | ICD-10-CM | POA: Diagnosis not present

## 2022-07-16 DIAGNOSIS — E1165 Type 2 diabetes mellitus with hyperglycemia: Secondary | ICD-10-CM | POA: Diagnosis not present

## 2022-07-16 DIAGNOSIS — R109 Unspecified abdominal pain: Secondary | ICD-10-CM | POA: Diagnosis not present

## 2022-07-18 ENCOUNTER — Other Ambulatory Visit: Payer: Self-pay | Admitting: Family Medicine

## 2022-07-27 ENCOUNTER — Other Ambulatory Visit (HOSPITAL_COMMUNITY): Payer: Self-pay

## 2022-08-06 DIAGNOSIS — R739 Hyperglycemia, unspecified: Secondary | ICD-10-CM | POA: Diagnosis not present

## 2022-08-06 DIAGNOSIS — J984 Other disorders of lung: Secondary | ICD-10-CM | POA: Diagnosis not present

## 2022-08-06 DIAGNOSIS — Z79890 Hormone replacement therapy (postmenopausal): Secondary | ICD-10-CM | POA: Diagnosis not present

## 2022-08-06 DIAGNOSIS — I10 Essential (primary) hypertension: Secondary | ICD-10-CM | POA: Diagnosis not present

## 2022-08-06 DIAGNOSIS — Z885 Allergy status to narcotic agent status: Secondary | ICD-10-CM | POA: Diagnosis not present

## 2022-08-06 DIAGNOSIS — Z794 Long term (current) use of insulin: Secondary | ICD-10-CM | POA: Diagnosis not present

## 2022-08-06 DIAGNOSIS — Z7982 Long term (current) use of aspirin: Secondary | ICD-10-CM | POA: Diagnosis not present

## 2022-08-06 DIAGNOSIS — R000 Tachycardia, unspecified: Secondary | ICD-10-CM | POA: Diagnosis not present

## 2022-08-06 DIAGNOSIS — R9431 Abnormal electrocardiogram [ECG] [EKG]: Secondary | ICD-10-CM | POA: Diagnosis not present

## 2022-08-06 DIAGNOSIS — Z79899 Other long term (current) drug therapy: Secondary | ICD-10-CM | POA: Diagnosis not present

## 2022-08-06 DIAGNOSIS — E785 Hyperlipidemia, unspecified: Secondary | ICD-10-CM | POA: Diagnosis not present

## 2022-08-06 DIAGNOSIS — R112 Nausea with vomiting, unspecified: Secondary | ICD-10-CM | POA: Diagnosis not present

## 2022-08-06 DIAGNOSIS — E1165 Type 2 diabetes mellitus with hyperglycemia: Secondary | ICD-10-CM | POA: Diagnosis not present

## 2022-08-06 DIAGNOSIS — R918 Other nonspecific abnormal finding of lung field: Secondary | ICD-10-CM | POA: Diagnosis not present

## 2022-08-06 DIAGNOSIS — R0789 Other chest pain: Secondary | ICD-10-CM | POA: Diagnosis not present

## 2022-08-06 DIAGNOSIS — Z90710 Acquired absence of both cervix and uterus: Secondary | ICD-10-CM | POA: Diagnosis not present

## 2022-08-06 DIAGNOSIS — R079 Chest pain, unspecified: Secondary | ICD-10-CM | POA: Diagnosis not present

## 2022-08-06 DIAGNOSIS — Z8679 Personal history of other diseases of the circulatory system: Secondary | ICD-10-CM | POA: Diagnosis not present

## 2022-08-14 ENCOUNTER — Encounter: Payer: Self-pay | Admitting: Family Medicine

## 2022-08-14 ENCOUNTER — Ambulatory Visit (INDEPENDENT_AMBULATORY_CARE_PROVIDER_SITE_OTHER): Payer: BC Managed Care – PPO | Admitting: Family Medicine

## 2022-08-14 VITALS — BP 90/60 | HR 88 | Temp 98.2°F | Ht 59.75 in | Wt 119.2 lb

## 2022-08-14 DIAGNOSIS — G8929 Other chronic pain: Secondary | ICD-10-CM

## 2022-08-14 DIAGNOSIS — R112 Nausea with vomiting, unspecified: Secondary | ICD-10-CM

## 2022-08-14 DIAGNOSIS — E1149 Type 2 diabetes mellitus with other diabetic neurological complication: Secondary | ICD-10-CM

## 2022-08-14 DIAGNOSIS — K269 Duodenal ulcer, unspecified as acute or chronic, without hemorrhage or perforation: Secondary | ICD-10-CM

## 2022-08-14 DIAGNOSIS — M25562 Pain in left knee: Secondary | ICD-10-CM

## 2022-08-14 DIAGNOSIS — S91101D Unspecified open wound of right great toe without damage to nail, subsequent encounter: Secondary | ICD-10-CM | POA: Diagnosis not present

## 2022-08-14 DIAGNOSIS — M25561 Pain in right knee: Secondary | ICD-10-CM

## 2022-08-14 DIAGNOSIS — K219 Gastro-esophageal reflux disease without esophagitis: Secondary | ICD-10-CM

## 2022-08-14 DIAGNOSIS — K253 Acute gastric ulcer without hemorrhage or perforation: Secondary | ICD-10-CM

## 2022-08-14 LAB — BASIC METABOLIC PANEL
BUN: 16 mg/dL (ref 6–23)
CO2: 28 mEq/L (ref 19–32)
Calcium: 9.4 mg/dL (ref 8.4–10.5)
Chloride: 96 mEq/L (ref 96–112)
Creatinine, Ser: 0.66 mg/dL (ref 0.40–1.20)
GFR: 96.22 mL/min (ref >60.00)
Glucose, Bld: 302 mg/dL — ABNORMAL HIGH (ref 70–99)
Potassium: 4.2 mEq/L (ref 3.5–5.1)
Sodium: 132 mEq/L — ABNORMAL LOW (ref 135–145)

## 2022-08-14 LAB — CBC WITH DIFFERENTIAL/PLATELET
Basophils Absolute: 0.1 10*3/uL (ref 0.0–0.1)
Basophils Relative: 0.6 % (ref 0.0–3.0)
Eosinophils Absolute: 0.1 10*3/uL (ref 0.0–0.7)
Eosinophils Relative: 1.3 % (ref 0.0–5.0)
HCT: 42.3 % (ref 36.0–46.0)
Hemoglobin: 14.1 g/dL (ref 12.0–15.0)
Lymphocytes Relative: 35.2 % (ref 12.0–46.0)
Lymphs Abs: 3.4 10*3/uL (ref 0.7–4.0)
MCHC: 33.4 g/dL (ref 30.0–36.0)
MCV: 86.8 fl (ref 78.0–100.0)
Monocytes Absolute: 0.5 10*3/uL (ref 0.1–1.0)
Monocytes Relative: 5.3 % (ref 3.0–12.0)
Neutro Abs: 5.5 10*3/uL (ref 1.4–7.7)
Neutrophils Relative %: 57.6 % (ref 43.0–77.0)
Platelets: 349 10*3/uL (ref 150.0–400.0)
RBC: 4.87 Mil/uL (ref 3.87–5.11)
RDW: 15.5 % (ref 11.5–15.5)
WBC: 9.5 10*3/uL (ref 4.0–10.5)

## 2022-08-14 MED ORDER — CEPHALEXIN 500 MG PO CAPS: 500.0000 mg | ORAL_CAPSULE | Freq: Three times a day (TID) | ORAL | 0 refills | Status: DC

## 2022-08-14 MED ORDER — ELETRIPTAN HYDROBROMIDE 40 MG PO TABS
40.0000 mg | ORAL_TABLET | ORAL | 0 refills | Status: DC | PRN
Start: 2022-08-14 — End: 2023-05-14

## 2022-08-14 NOTE — Progress Notes (Signed)
Patient ID: [name] [name], female    DOB: 21-Jan-1961, 61 y.o.   MRN: [id number]  This visit was conducted in person.  BP 90/60   Pulse 88   Temp 98.2 F (36.8 C) (Oral)   Ht 4' 11.75" (1.518 m)   Wt 119 lb 4 oz (54.1 kg)   LMP 08/11/2012   SpO2 99%   BMI 23.48 kg/m    CC: .Marland Kitchen Subjective:   HPI: [name] [name] is a 61 y.o. female with poorly controlled diabetes, paroxysmal atrial fibrillation, gastroparesis due to diabetes, chronic systolic heart failure, chronic kidney disease, hypothyroidism presenting on 08/14/2022 for Hospitalization Follow-up (2-[place]-Hyperglycemia and 1 admission at [facility] in [place] for DKA)  She is following up on multiple ER visits both at [facility] and [facility] for hyperglycemia or diabetic ketoacidosis and diabetic ulcer of the toe All ER visits reviewed in detail.  Hospital admission on August 7 for DKA and diabetic ulcer of toe on right foot ( non-infected)  At most recent ER visit on 08/06/2022 .Marland Kitchen  Started with nausea and vomiting for the prior 2 days followed with elevated blood sugars.  Summary as follows: "Initial EKG is without any acute ST abnormalities. The patient was initiated on IV fluids and Zofran. Patient did require Reglan and Benadryl. She had improvement was able to tolerate oral fluids well. Initial lab studies noted for sugar of 450. Potassium 3.4. She was given supplemental additional insulin and potassium. She had no evidence of anion gap or dehydration. She had improvement in his symptoms and was able to tolerate oral fluids well repeated Accu-Cheks were improved and in the 200 range. Patient be discharged home with a prescription for Zofran."    Today she reports:  She is feeling better overall.  FBS running  200s  She feels like her issues come from her stomach... if she cannot eat, she cannot take her insulin then ends up in DKA. She feels it is a cycle she cannot get out of. She states her GI Dr. Eugenia Pancoast said if she stopped  smoking pot her issues would resolve and she is unhappy about this recommendation.  She would like a new referral to a different GI MD for possible endoscopy.    She is requesting a referral to a different ENDO as she is feeling very confused about her instructions on medications and diabetes control remains poor.   She is having more issues with  knee pain bilaterally.. she is requesting orthopedic referral.  Has appt on 10/5 with podiatry for diabetic ulcer on right great toe. Relevant past medical, surgical, family and social history reviewed and updated as indicated. Interim medical history since our last visit reviewed. Allergies and medications reviewed and updated. Outpatient Medications Prior to Visit  Medication Sig Dispense Refill   aspirin EC 81 MG tablet Take 81 mg by mouth daily. Swallow whole.     atorvastatin (LIPITOR) 80 MG tablet Take 1 tablet (80 mg total) by mouth daily. 90 tablet 3   Continuous Blood Gluc Sensor (DEXCOM G6 SENSOR) MISC INJECT 1 SENSOR INTO SKIN EVERY 10 DAYS 3 each 11   Continuous Blood Gluc Transmit (DEXCOM G6 TRANSMITTER) MISC USE AS DIRECTED FOR CONTINUOUS GLUCOSE MONITORING. REUSE TRANSMITTER X 90DAYS THEN DISCARD & REPLACE 1 each 3   insulin lispro (HUMALOG KWIKPEN) 100 UNIT/ML KwikPen INJECT INTO SKIN 3 TIMES A DAY AT MEAL TIME ACCORDING TO THE FOLLOWING SLIDING SCALE: 27 UNITS MAX PER DAY DX E11.43 (Patient taking differently:  Inject 1-4 Units into the skin with breakfast, with lunch, and with evening meal. Per sliding scale.) 15 mL 5   Insulin Pen Needle (PEN NEEDLES) 31G X 8 MM MISC Use to inject insulin 4 times a day.  Dx: E11.10 130 each 11   Lancets (ONETOUCH ULTRASOFT) lancets Use to check blood sugar daily as needed.  Dx: E11.10 100 each 3   levothyroxine (SYNTHROID) 137 MCG tablet Take 959 mcg by mouth every Sunday.     metoCLOPramide (REGLAN) 5 MG tablet Take 1 tablet (5 mg total) by mouth 4 (four) times daily -  before meals and at bedtime.  120 tablet 11   midodrine (PROAMATINE) 5 MG tablet Take 1 tablet (5 mg total) by mouth 2 (two) times daily with a meal. 180 tablet 3   pantoprazole (PROTONIX) 40 MG tablet Take 1 tablet (40 mg total) by mouth 2 (two) times daily before a meal. 30 tablet 0   promethazine (PHENERGAN) 25 MG tablet Take 1 tablet (25 mg total) by mouth every 8 (eight) hours as needed for nausea or vomiting. 30 tablet 3   sucralfate (CARAFATE) 1 g tablet Take 1 tablet (1 g total) by mouth 4 (four) times daily. 120 tablet 1   TOUJEO SOLOSTAR 300 UNIT/ML Solostar Pen Inject 6 Units into the skin at bedtime. 100 mL 0   doxycycline (VIBRA-TABS) 100 MG tablet Take 1 tablet (100 mg total) by mouth 2 (two) times daily. 20 tablet 0   No facility-administered medications prior to visit.     Per HPI unless specifically indicated in ROS section below Review of Systems  Constitutional:  Positive for fatigue. Negative for fever.  HENT:  Negative for congestion.   Eyes:  Negative for pain.  Respiratory:  Negative for cough and shortness of breath.   Cardiovascular:  Negative for chest pain, palpitations and leg swelling.  Gastrointestinal:  Negative for abdominal pain.  Genitourinary:  Negative for dysuria and vaginal bleeding.  Musculoskeletal:  Negative for back pain.  Neurological:  Negative for syncope, light-headedness and headaches.  Psychiatric/Behavioral:  Negative for dysphoric mood.    Objective:  BP 90/60   Pulse 88   Temp 98.2 F (36.8 C) (Oral)   Ht 4' 11.75" (1.518 m)   Wt 119 lb 4 oz (54.1 kg)   LMP 08/11/2012   SpO2 99%   BMI 23.48 kg/m   Wt Readings from Last 3 Encounters:  08/14/22 119 lb 4 oz (54.1 kg)  04/30/22 116 lb 4 oz (52.7 kg)  04/24/22 114 lb 4 oz (51.8 kg)      Physical Exam Constitutional:      General: She is not in acute distress.    Appearance: Normal appearance. She is well-developed. She is not ill-appearing or toxic-appearing.     Comments: unkempt  HENT:     Head:  Normocephalic.     Right Ear: Hearing, tympanic membrane, ear canal and external ear normal. Tympanic membrane is not erythematous, retracted or bulging.     Left Ear: Hearing, tympanic membrane, ear canal and external ear normal. Tympanic membrane is not erythematous, retracted or bulging.     Nose: No mucosal edema or rhinorrhea.     Right Sinus: No maxillary sinus tenderness or frontal sinus tenderness.     Left Sinus: No maxillary sinus tenderness or frontal sinus tenderness.     Mouth/Throat:     Pharynx: Uvula midline.  Eyes:     General: Lids are normal. Lids are everted, no  foreign bodies appreciated.     Conjunctiva/sclera: Conjunctivae normal.     Pupils: Pupils are equal, round, and reactive to light.  Neck:     Thyroid: No thyroid mass or thyromegaly.     Vascular: No carotid bruit.     Trachea: Trachea normal.  Cardiovascular:     Rate and Rhythm: Normal rate and regular rhythm.     Pulses: Normal pulses.     Heart sounds: Normal heart sounds, S1 normal and S2 normal. No murmur heard.    No friction rub. No gallop.  Pulmonary:     Effort: Pulmonary effort is normal. No tachypnea or respiratory distress.     Breath sounds: Normal breath sounds. No decreased breath sounds, wheezing, rhonchi or rales.  Abdominal:     General: Bowel sounds are normal.     Palpations: Abdomen is soft.     Tenderness: There is no abdominal tenderness.  Musculoskeletal:     Cervical back: Normal range of motion and neck supple.     Right foot: Normal range of motion.  Feet:     Right foot:     Protective Sensation: 2 sites tested.  2 sites sensed.     Skin integrity: Ulcer, skin breakdown, erythema, warmth and callus present.     Toenail Condition: Right toenails are abnormally thick.     Left foot:     Skin integrity: Skin integrity normal.     Toenail Condition: Left toenails are abnormally thick.  Skin:    General: Skin is warm and dry.     Findings: No rash.  Neurological:      Mental Status: She is alert.  Psychiatric:        Mood and Affect: Mood is not anxious or depressed.        Speech: Speech normal.        Behavior: Behavior normal. Behavior is cooperative.        Thought Content: Thought content normal.        Judgment: Judgment normal.            Results for orders placed or performed during the hospital encounter of 06/26/22  Lipase, blood  Result Value Ref Range   Lipase 30 11 - 51 U/L  Comprehensive metabolic panel  Result Value Ref Range   Sodium 135 135 - 145 mmol/L   Potassium 3.7 3.5 - 5.1 mmol/L   Chloride 94 (L) 98 - 111 mmol/L   CO2 25 22 - 32 mmol/L   Glucose, Bld 458 (H) 70 - 99 mg/dL   BUN 10 6 - 20 mg/dL   Creatinine, Ser 0.76 0.44 - 1.00 mg/dL   Calcium 9.1 8.9 - 10.3 mg/dL   Total Protein 7.0 6.5 - 8.1 g/dL   Albumin 3.7 3.5 - 5.0 g/dL   AST 13 (L) 15 - 41 U/L   ALT 14 0 - 44 U/L   Alkaline Phosphatase 79 38 - 126 U/L   Total Bilirubin 0.9 0.3 - 1.2 mg/dL   GFR, Estimated >60 >60 mL/min   Anion gap 16 (H) 5 - 15  CBC  Result Value Ref Range   WBC 11.8 (H) 4.0 - 10.5 K/uL   RBC 5.10 3.87 - 5.11 MIL/uL   Hemoglobin 15.3 (H) 12.0 - 15.0 g/dL   HCT 44.1 36.0 - 46.0 %   MCV 86.5 80.0 - 100.0 fL   MCH 30.0 26.0 - 34.0 pg   MCHC 34.7 30.0 - 36.0 g/dL   RDW  14.6 11.5 - 15.5 %   Platelets 480 (H) 150 - 400 K/uL   nRBC 0.0 0.0 - 0.2 %  I-Stat beta hCG blood, ED  Result Value Ref Range   I-stat hCG, quantitative <5.0 <5 mIU/mL   Comment 3          CBG monitoring, ED  Result Value Ref Range   Glucose-Capillary 445 (H) 70 - 99 mg/dL  I-Stat venous blood gas, ED  Result Value Ref Range   pH, Ven 7.491 (H) 7.25 - 7.43   pCO2, Ven 36.4 (L) 44 - 60 mmHg   pO2, Ven 70 (H) 32 - 45 mmHg   Bicarbonate 27.8 20.0 - 28.0 mmol/L   TCO2 29 22 - 32 mmol/L   O2 Saturation 95 %   Acid-Base Excess 4.0 (H) 0.0 - 2.0 mmol/L   Sodium 134 (L) 135 - 145 mmol/L   Potassium 4.1 3.5 - 5.1 mmol/L   Calcium, Ion 1.02 (L) 1.15 - 1.40  mmol/L   HCT 48.0 (H) 36.0 - 46.0 %   Hemoglobin 16.3 (H) 12.0 - 15.0 g/dL   Sample type VENOUS   CBG monitoring, ED  Result Value Ref Range   Glucose-Capillary 454 (H) 70 - 99 mg/dL   Comment 1 Notify RN    Comment 2 Document in Chart   CBG monitoring, ED  Result Value Ref Range   Glucose-Capillary 348 (H) 70 - 99 mg/dL   Comment 1 Notify RN    Comment 2 Document in Chart   CBG monitoring, ED  Result Value Ref Range   Glucose-Capillary 312 (H) 70 - 99 mg/dL   Comment 1 Notify RN    Comment 2 Document in Chart   CBG monitoring, ED  Result Value Ref Range   Glucose-Capillary 313 (H) 70 - 99 mg/dL   Comment 1 Notify RN    Comment 2 Document in Chart   CBG monitoring, ED  Result Value Ref Range   Glucose-Capillary 220 (H) 70 - 99 mg/dL   Comment 1 Notify RN    Comment 2 Document in Chart      COVID 19 screen:  No recent travel or known exposure to COVID19 The patient denies respiratory symptoms of COVID 19 at this time. The importance of social distancing was discussed today.   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  Patient states her GI issues are what limits her compliance with her diabetes medications.  When she has her abdominal pain she cannot eat and then therefore does not take her diabetes medicines and then ends up in DKA.  She is requesting a referral to a different endocrinologist.      Relevant Orders   Basic Metabolic Panel (Completed)   Ambulatory referral to Endocrinology   Ambulatory referral to Gastroenterology   Chronic pain of both knees    Chronic, most likely secondary to osteoarthritis.  Reminded her to never use nonsteroidal anti-inflammatories given GI issues.  She would likely benefit from steroid injections.  Referral to  orthopedics at [facility] made as requested by patient.      Relevant Orders   Ambulatory referral to Orthopedic Surgery   Duodenal ulcer without hemorrhage or perforation    Referral placed for GI second opinion.  Relevant Orders   Ambulatory referral to Gastroenterology   Gastric erosion   Relevant Orders   Ambulatory referral to Gastroenterology   Intractable nausea and vomiting    Referral placed for GI second opinion.      Relevant Orders   Ambulatory referral to Gastroenterology   Open wound of right great toe - Primary    Acute, possible associated infection.  She is very high risk for wound infection and future amputation given poor diabetes control.  I will treat with a course of antibiotics to cover for associated wound infection given redness at site Treat with Keflex 500 mg p.o. 3 times daily x7 days. I recommended wound center, but she would prefer to have referral to podiatry at [facility].  I encouraged her to set this up ASAP. Return precautions given.      Relevant Orders   CBC with Differential/Platelet (Completed)   Meds ordered this encounter  Medications   cephALEXin (KEFLEX) 500 MG capsule    Sig: Take 1 capsule (500 mg total) by mouth 3 (three) times daily.    Dispense:  21 capsule    Refill:  0   eletriptan (RELPAX) 40 MG tablet    Sig: Take 1 tablet (40 mg total) by mouth as needed for migraine or headache. May repeat in 2 hours if headache persists or recurs.    Dispense:  10 tablet    Refill:  0   Orders Placed This Encounter  Procedures   Basic Metabolic Panel   CBC with Differential/Platelet   Ambulatory referral to Orthopedic Surgery    Referral Priority:   Routine    Referral Type:   Surgical    Referral Reason:   Specialty Services Required    Requested Specialty:   Orthopedic Surgery    Number of Visits Requested:   1   Ambulatory referral to Endocrinology    Referral Priority:   Routine    Referral Type:   Consultation    Referral  Reason:   Specialty Services Required    Number of Visits Requested:   1   Ambulatory referral to Gastroenterology    Referral Priority:   Routine    Referral Type:   Consultation    Referral Reason:   Specialty Services Required    Number of Visits Requested:   1     Eliezer Lofts, MD

## 2022-08-14 NOTE — Patient Instructions (Addendum)
Wash foot daily with warm soapy water.  Topical  antibiotic ointment on foot.   Elevate foot above heart.  Complete course of antibiotics.  Keep appt for podiatry.  Referral to GI, ORTHO and ENDO at [facility] placed as requested.  Please stop at the lab to have labs drawn.

## 2022-08-16 ENCOUNTER — Encounter: Payer: Self-pay | Admitting: *Deleted

## 2022-08-20 DIAGNOSIS — G8929 Other chronic pain: Secondary | ICD-10-CM | POA: Insufficient documentation

## 2022-08-20 NOTE — Assessment & Plan Note (Signed)
Referral placed for GI second opinion.

## 2022-08-20 NOTE — Assessment & Plan Note (Signed)
Acute, possible associated infection.  She is very high risk for wound infection and future amputation given poor diabetes control.  I will treat with a course of antibiotics to cover for associated wound infection given redness at site Treat with Keflex 500 mg p.o. 3 times daily x7 days. I recommended wound center, but she would prefer to have referral to podiatry at [facility].  I encouraged her to set this up ASAP. Return precautions given.

## 2022-08-20 NOTE — Assessment & Plan Note (Signed)
Chronic, most likely secondary to osteoarthritis.  Reminded her to never use nonsteroidal anti-inflammatories given GI issues.  She would likely benefit from steroid injections.  Referral to orthopedics at [facility] made as requested by patient.

## 2022-08-20 NOTE — Assessment & Plan Note (Signed)
Chronic, continued poor control.  Patient states her GI issues are what limits her compliance with her diabetes medications.  When she has her abdominal pain she cannot eat and then therefore does not take her diabetes medicines and then ends up in DKA.  She is requesting a referral to a different endocrinologist.

## 2022-08-25 DIAGNOSIS — D696 Thrombocytopenia, unspecified: Secondary | ICD-10-CM | POA: Diagnosis not present

## 2022-08-25 DIAGNOSIS — S0990XA Unspecified injury of head, initial encounter: Secondary | ICD-10-CM | POA: Diagnosis not present

## 2022-08-25 DIAGNOSIS — L97419 Non-pressure chronic ulcer of right heel and midfoot with unspecified severity: Secondary | ICD-10-CM | POA: Diagnosis not present

## 2022-08-25 DIAGNOSIS — L03115 Cellulitis of right lower limb: Secondary | ICD-10-CM | POA: Diagnosis not present

## 2022-08-25 DIAGNOSIS — D72829 Elevated white blood cell count, unspecified: Secondary | ICD-10-CM | POA: Diagnosis not present

## 2022-08-25 DIAGNOSIS — L97519 Non-pressure chronic ulcer of other part of right foot with unspecified severity: Secondary | ICD-10-CM | POA: Diagnosis not present

## 2022-08-25 DIAGNOSIS — M19071 Primary osteoarthritis, right ankle and foot: Secondary | ICD-10-CM | POA: Diagnosis not present

## 2022-08-25 DIAGNOSIS — I4891 Unspecified atrial fibrillation: Secondary | ICD-10-CM | POA: Diagnosis not present

## 2022-08-25 DIAGNOSIS — E1169 Type 2 diabetes mellitus with other specified complication: Secondary | ICD-10-CM | POA: Diagnosis not present

## 2022-08-25 DIAGNOSIS — R112 Nausea with vomiting, unspecified: Secondary | ICD-10-CM | POA: Diagnosis not present

## 2022-08-25 DIAGNOSIS — R079 Chest pain, unspecified: Secondary | ICD-10-CM | POA: Diagnosis not present

## 2022-08-25 DIAGNOSIS — E1010 Type 1 diabetes mellitus with ketoacidosis without coma: Secondary | ICD-10-CM | POA: Diagnosis not present

## 2022-08-25 DIAGNOSIS — E10621 Type 1 diabetes mellitus with foot ulcer: Secondary | ICD-10-CM | POA: Diagnosis not present

## 2022-08-25 DIAGNOSIS — E871 Hypo-osmolality and hyponatremia: Secondary | ICD-10-CM | POA: Diagnosis not present

## 2022-08-25 DIAGNOSIS — R739 Hyperglycemia, unspecified: Secondary | ICD-10-CM | POA: Diagnosis not present

## 2022-08-25 DIAGNOSIS — R918 Other nonspecific abnormal finding of lung field: Secondary | ICD-10-CM | POA: Diagnosis not present

## 2022-08-25 DIAGNOSIS — E785 Hyperlipidemia, unspecified: Secondary | ICD-10-CM | POA: Diagnosis not present

## 2022-08-25 DIAGNOSIS — E1043 Type 1 diabetes mellitus with diabetic autonomic (poly)neuropathy: Secondary | ICD-10-CM | POA: Diagnosis not present

## 2022-08-25 DIAGNOSIS — M79674 Pain in right toe(s): Secondary | ICD-10-CM | POA: Diagnosis not present

## 2022-08-25 DIAGNOSIS — E039 Hypothyroidism, unspecified: Secondary | ICD-10-CM | POA: Diagnosis not present

## 2022-08-25 DIAGNOSIS — K3184 Gastroparesis: Secondary | ICD-10-CM | POA: Diagnosis not present

## 2022-08-25 DIAGNOSIS — E1143 Type 2 diabetes mellitus with diabetic autonomic (poly)neuropathy: Secondary | ICD-10-CM | POA: Diagnosis not present

## 2022-08-25 DIAGNOSIS — J9809 Other diseases of bronchus, not elsewhere classified: Secondary | ICD-10-CM | POA: Diagnosis not present

## 2022-08-29 ENCOUNTER — Telehealth: Payer: Self-pay | Admitting: Family Medicine

## 2022-08-29 DIAGNOSIS — B351 Tinea unguium: Secondary | ICD-10-CM | POA: Diagnosis not present

## 2022-08-29 DIAGNOSIS — L97512 Non-pressure chronic ulcer of other part of right foot with fat layer exposed: Secondary | ICD-10-CM | POA: Diagnosis not present

## 2022-08-29 DIAGNOSIS — E1042 Type 1 diabetes mellitus with diabetic polyneuropathy: Secondary | ICD-10-CM | POA: Diagnosis not present

## 2022-08-29 DIAGNOSIS — E10621 Type 1 diabetes mellitus with foot ulcer: Secondary | ICD-10-CM | POA: Diagnosis not present

## 2022-08-29 DIAGNOSIS — M2041 Other hammer toe(s) (acquired), right foot: Secondary | ICD-10-CM | POA: Diagnosis not present

## 2022-08-29 DIAGNOSIS — R234 Changes in skin texture: Secondary | ICD-10-CM | POA: Diagnosis not present

## 2022-08-29 NOTE — Telephone Encounter (Signed)
Lft pt vm to call ofc regarding referral. thanks 

## 2022-09-11 DIAGNOSIS — M25561 Pain in right knee: Secondary | ICD-10-CM | POA: Diagnosis not present

## 2022-09-11 DIAGNOSIS — M1711 Unilateral primary osteoarthritis, right knee: Secondary | ICD-10-CM | POA: Diagnosis not present

## 2022-09-11 DIAGNOSIS — M25562 Pain in left knee: Secondary | ICD-10-CM | POA: Diagnosis not present

## 2022-09-11 DIAGNOSIS — G8929 Other chronic pain: Secondary | ICD-10-CM | POA: Diagnosis not present

## 2022-09-12 DIAGNOSIS — E119 Type 2 diabetes mellitus without complications: Secondary | ICD-10-CM | POA: Diagnosis not present

## 2022-09-12 DIAGNOSIS — I10 Essential (primary) hypertension: Secondary | ICD-10-CM | POA: Diagnosis not present

## 2022-09-12 DIAGNOSIS — R531 Weakness: Secondary | ICD-10-CM | POA: Diagnosis not present

## 2022-09-12 DIAGNOSIS — R9431 Abnormal electrocardiogram [ECG] [EKG]: Secondary | ICD-10-CM | POA: Diagnosis not present

## 2022-09-12 DIAGNOSIS — E785 Hyperlipidemia, unspecified: Secondary | ICD-10-CM | POA: Diagnosis not present

## 2022-09-12 DIAGNOSIS — Z79899 Other long term (current) drug therapy: Secondary | ICD-10-CM | POA: Diagnosis not present

## 2022-09-12 DIAGNOSIS — Z885 Allergy status to narcotic agent status: Secondary | ICD-10-CM | POA: Diagnosis not present

## 2022-09-12 DIAGNOSIS — R112 Nausea with vomiting, unspecified: Secondary | ICD-10-CM | POA: Diagnosis not present

## 2022-09-12 DIAGNOSIS — Z7982 Long term (current) use of aspirin: Secondary | ICD-10-CM | POA: Diagnosis not present

## 2022-09-12 DIAGNOSIS — K219 Gastro-esophageal reflux disease without esophagitis: Secondary | ICD-10-CM | POA: Diagnosis not present

## 2022-09-12 DIAGNOSIS — R1011 Right upper quadrant pain: Secondary | ICD-10-CM | POA: Diagnosis not present

## 2022-09-12 DIAGNOSIS — E039 Hypothyroidism, unspecified: Secondary | ICD-10-CM | POA: Diagnosis not present

## 2022-09-12 DIAGNOSIS — R000 Tachycardia, unspecified: Secondary | ICD-10-CM | POA: Diagnosis not present

## 2022-09-12 DIAGNOSIS — Z794 Long term (current) use of insulin: Secondary | ICD-10-CM | POA: Diagnosis not present

## 2022-09-20 DIAGNOSIS — R519 Headache, unspecified: Secondary | ICD-10-CM | POA: Diagnosis not present

## 2022-09-20 DIAGNOSIS — E1165 Type 2 diabetes mellitus with hyperglycemia: Secondary | ICD-10-CM | POA: Diagnosis not present

## 2022-09-20 DIAGNOSIS — Z885 Allergy status to narcotic agent status: Secondary | ICD-10-CM | POA: Diagnosis not present

## 2022-09-20 DIAGNOSIS — I10 Essential (primary) hypertension: Secondary | ICD-10-CM | POA: Diagnosis not present

## 2022-09-20 DIAGNOSIS — E039 Hypothyroidism, unspecified: Secondary | ICD-10-CM | POA: Diagnosis not present

## 2022-09-20 DIAGNOSIS — R110 Nausea: Secondary | ICD-10-CM | POA: Diagnosis not present

## 2022-10-16 ENCOUNTER — Ambulatory Visit: Payer: BC Managed Care – PPO | Admitting: Family Medicine

## 2022-10-18 DIAGNOSIS — E873 Alkalosis: Secondary | ICD-10-CM | POA: Diagnosis present

## 2022-10-18 DIAGNOSIS — Z885 Allergy status to narcotic agent status: Secondary | ICD-10-CM | POA: Diagnosis not present

## 2022-10-18 DIAGNOSIS — Z794 Long term (current) use of insulin: Secondary | ICD-10-CM | POA: Diagnosis not present

## 2022-10-18 DIAGNOSIS — I420 Dilated cardiomyopathy: Secondary | ICD-10-CM | POA: Diagnosis not present

## 2022-10-18 DIAGNOSIS — D72829 Elevated white blood cell count, unspecified: Secondary | ICD-10-CM | POA: Diagnosis not present

## 2022-10-18 DIAGNOSIS — N390 Urinary tract infection, site not specified: Secondary | ICD-10-CM | POA: Diagnosis not present

## 2022-10-18 DIAGNOSIS — E039 Hypothyroidism, unspecified: Secondary | ICD-10-CM | POA: Diagnosis not present

## 2022-10-18 DIAGNOSIS — F419 Anxiety disorder, unspecified: Secondary | ICD-10-CM | POA: Diagnosis not present

## 2022-10-18 DIAGNOSIS — R1110 Vomiting, unspecified: Secondary | ICD-10-CM | POA: Diagnosis not present

## 2022-10-18 DIAGNOSIS — Z7984 Long term (current) use of oral hypoglycemic drugs: Secondary | ICD-10-CM | POA: Diagnosis not present

## 2022-10-18 DIAGNOSIS — E785 Hyperlipidemia, unspecified: Secondary | ICD-10-CM | POA: Diagnosis not present

## 2022-10-18 DIAGNOSIS — Z8679 Personal history of other diseases of the circulatory system: Secondary | ICD-10-CM | POA: Diagnosis not present

## 2022-10-18 DIAGNOSIS — E1143 Type 2 diabetes mellitus with diabetic autonomic (poly)neuropathy: Secondary | ICD-10-CM | POA: Diagnosis not present

## 2022-10-18 DIAGNOSIS — I10 Essential (primary) hypertension: Secondary | ICD-10-CM | POA: Diagnosis not present

## 2022-10-18 DIAGNOSIS — Z7982 Long term (current) use of aspirin: Secondary | ICD-10-CM | POA: Diagnosis not present

## 2022-10-18 DIAGNOSIS — Z79890 Hormone replacement therapy (postmenopausal): Secondary | ICD-10-CM | POA: Diagnosis not present

## 2022-10-18 DIAGNOSIS — E1110 Type 2 diabetes mellitus with ketoacidosis without coma: Secondary | ICD-10-CM | POA: Diagnosis not present

## 2022-10-18 DIAGNOSIS — E871 Hypo-osmolality and hyponatremia: Secondary | ICD-10-CM | POA: Diagnosis not present

## 2022-10-18 DIAGNOSIS — F32A Depression, unspecified: Secondary | ICD-10-CM | POA: Diagnosis not present

## 2022-10-22 ENCOUNTER — Telehealth: Payer: Self-pay

## 2022-10-22 NOTE — Telephone Encounter (Signed)
Transition Care Management Unsuccessful Follow-up Telephone Call  Date of discharge and from where:  [facility] d/c 10/21/2022  Attempts:  1st Attempt  Reason for unsuccessful TCM follow-up call:  Left voice message

## 2022-10-23 NOTE — Telephone Encounter (Signed)
Transition Care Management Unsuccessful Follow-up Telephone Call  Date of discharge and from where:  d/c [facility] 10/21/2022  Attempts:  2nd Attempt  Reason for unsuccessful TCM follow-up call:  Left voice message

## 2022-10-26 NOTE — Telephone Encounter (Signed)
Noted  

## 2022-10-26 NOTE — Telephone Encounter (Signed)
Transition Care Management Unsuccessful Follow-up Telephone Call  Date of discharge and from where:  10/21/2022  Attempts:  3rd Attempt  Reason for unsuccessful TCM follow-up call:  Left voice message

## 2022-11-10 DIAGNOSIS — Z20822 Contact with and (suspected) exposure to covid-19: Secondary | ICD-10-CM | POA: Diagnosis not present

## 2022-11-10 DIAGNOSIS — R339 Retention of urine, unspecified: Secondary | ICD-10-CM | POA: Diagnosis not present

## 2022-11-10 DIAGNOSIS — K5730 Diverticulosis of large intestine without perforation or abscess without bleeding: Secondary | ICD-10-CM | POA: Diagnosis not present

## 2022-11-10 DIAGNOSIS — K838 Other specified diseases of biliary tract: Secondary | ICD-10-CM | POA: Diagnosis not present

## 2022-11-10 DIAGNOSIS — K429 Umbilical hernia without obstruction or gangrene: Secondary | ICD-10-CM | POA: Diagnosis not present

## 2022-11-10 DIAGNOSIS — I129 Hypertensive chronic kidney disease with stage 1 through stage 4 chronic kidney disease, or unspecified chronic kidney disease: Secondary | ICD-10-CM | POA: Diagnosis not present

## 2022-11-10 DIAGNOSIS — E1122 Type 2 diabetes mellitus with diabetic chronic kidney disease: Secondary | ICD-10-CM | POA: Diagnosis not present

## 2022-11-10 DIAGNOSIS — T466X5A Adverse effect of antihyperlipidemic and antiarteriosclerotic drugs, initial encounter: Secondary | ICD-10-CM | POA: Diagnosis not present

## 2022-11-10 DIAGNOSIS — N3090 Cystitis, unspecified without hematuria: Secondary | ICD-10-CM | POA: Diagnosis not present

## 2022-11-10 DIAGNOSIS — Z885 Allergy status to narcotic agent status: Secondary | ICD-10-CM | POA: Diagnosis not present

## 2022-11-10 DIAGNOSIS — K859 Acute pancreatitis without necrosis or infection, unspecified: Secondary | ICD-10-CM | POA: Diagnosis not present

## 2022-11-10 DIAGNOSIS — E876 Hypokalemia: Secondary | ICD-10-CM | POA: Diagnosis not present

## 2022-11-10 DIAGNOSIS — I420 Dilated cardiomyopathy: Secondary | ICD-10-CM | POA: Diagnosis not present

## 2022-11-10 DIAGNOSIS — K529 Noninfective gastroenteritis and colitis, unspecified: Secondary | ICD-10-CM | POA: Diagnosis not present

## 2022-11-10 DIAGNOSIS — E039 Hypothyroidism, unspecified: Secondary | ICD-10-CM | POA: Diagnosis not present

## 2022-11-10 DIAGNOSIS — E1165 Type 2 diabetes mellitus with hyperglycemia: Secondary | ICD-10-CM | POA: Diagnosis not present

## 2022-11-10 DIAGNOSIS — E785 Hyperlipidemia, unspecified: Secondary | ICD-10-CM | POA: Diagnosis not present

## 2022-11-10 DIAGNOSIS — N1832 Chronic kidney disease, stage 3b: Secondary | ICD-10-CM | POA: Diagnosis not present

## 2022-11-10 DIAGNOSIS — R0602 Shortness of breath: Secondary | ICD-10-CM | POA: Diagnosis not present

## 2022-11-10 DIAGNOSIS — N179 Acute kidney failure, unspecified: Secondary | ICD-10-CM | POA: Diagnosis not present

## 2022-11-10 DIAGNOSIS — A419 Sepsis, unspecified organism: Secondary | ICD-10-CM | POA: Diagnosis not present

## 2022-11-10 DIAGNOSIS — N3000 Acute cystitis without hematuria: Secondary | ICD-10-CM | POA: Diagnosis not present

## 2022-11-10 DIAGNOSIS — K828 Other specified diseases of gallbladder: Secondary | ICD-10-CM | POA: Diagnosis not present

## 2022-11-10 DIAGNOSIS — N1831 Chronic kidney disease, stage 3a: Secondary | ICD-10-CM | POA: Diagnosis not present

## 2022-11-15 ENCOUNTER — Telehealth: Payer: Self-pay

## 2022-11-15 NOTE — Telephone Encounter (Signed)
Per pt she is switching medical doctors- no need to fu- TCM not done

## 2022-11-16 NOTE — Telephone Encounter (Signed)
Dr. Diona Browner removed as PCP.  FYI to Dr. Diona Browner.

## 2022-11-16 NOTE — Telephone Encounter (Signed)
Ok to remove Dr. Diona Browner as PCP

## 2022-11-21 DIAGNOSIS — E1069 Type 1 diabetes mellitus with other specified complication: Secondary | ICD-10-CM | POA: Diagnosis not present

## 2022-11-21 DIAGNOSIS — E039 Hypothyroidism, unspecified: Secondary | ICD-10-CM | POA: Diagnosis not present

## 2022-11-21 DIAGNOSIS — K859 Acute pancreatitis without necrosis or infection, unspecified: Secondary | ICD-10-CM | POA: Diagnosis not present

## 2022-11-21 DIAGNOSIS — G4709 Other insomnia: Secondary | ICD-10-CM | POA: Diagnosis not present

## 2022-11-21 DIAGNOSIS — Z09 Encounter for follow-up examination after completed treatment for conditions other than malignant neoplasm: Secondary | ICD-10-CM | POA: Diagnosis not present

## 2022-12-11 DIAGNOSIS — R519 Headache, unspecified: Secondary | ICD-10-CM | POA: Diagnosis not present

## 2022-12-11 DIAGNOSIS — Z885 Allergy status to narcotic agent status: Secondary | ICD-10-CM | POA: Diagnosis not present

## 2022-12-11 DIAGNOSIS — E1165 Type 2 diabetes mellitus with hyperglycemia: Secondary | ICD-10-CM | POA: Diagnosis not present

## 2022-12-11 DIAGNOSIS — R112 Nausea with vomiting, unspecified: Secondary | ICD-10-CM | POA: Diagnosis not present

## 2022-12-11 DIAGNOSIS — R739 Hyperglycemia, unspecified: Secondary | ICD-10-CM | POA: Diagnosis not present

## 2022-12-21 ENCOUNTER — Ambulatory Visit: Payer: BC Managed Care – PPO | Admitting: Physician Assistant

## 2022-12-25 DIAGNOSIS — Z79890 Hormone replacement therapy (postmenopausal): Secondary | ICD-10-CM | POA: Diagnosis not present

## 2022-12-25 DIAGNOSIS — E1110 Type 2 diabetes mellitus with ketoacidosis without coma: Secondary | ICD-10-CM | POA: Diagnosis not present

## 2022-12-25 DIAGNOSIS — R079 Chest pain, unspecified: Secondary | ICD-10-CM | POA: Diagnosis not present

## 2022-12-25 DIAGNOSIS — R9431 Abnormal electrocardiogram [ECG] [EKG]: Secondary | ICD-10-CM | POA: Diagnosis not present

## 2022-12-25 DIAGNOSIS — R110 Nausea: Secondary | ICD-10-CM | POA: Diagnosis not present

## 2022-12-25 DIAGNOSIS — Z7985 Long-term (current) use of injectable non-insulin antidiabetic drugs: Secondary | ICD-10-CM | POA: Diagnosis not present

## 2022-12-25 DIAGNOSIS — Z885 Allergy status to narcotic agent status: Secondary | ICD-10-CM | POA: Diagnosis not present

## 2022-12-25 DIAGNOSIS — K920 Hematemesis: Secondary | ICD-10-CM | POA: Diagnosis not present

## 2022-12-25 DIAGNOSIS — I1310 Hypertensive heart and chronic kidney disease without heart failure, with stage 1 through stage 4 chronic kidney disease, or unspecified chronic kidney disease: Secondary | ICD-10-CM | POA: Diagnosis not present

## 2022-12-25 DIAGNOSIS — Z794 Long term (current) use of insulin: Secondary | ICD-10-CM | POA: Diagnosis not present

## 2022-12-25 DIAGNOSIS — I10 Essential (primary) hypertension: Secondary | ICD-10-CM | POA: Diagnosis not present

## 2022-12-25 DIAGNOSIS — E1165 Type 2 diabetes mellitus with hyperglycemia: Secondary | ICD-10-CM | POA: Diagnosis not present

## 2022-12-25 DIAGNOSIS — E039 Hypothyroidism, unspecified: Secondary | ICD-10-CM | POA: Diagnosis not present

## 2022-12-25 DIAGNOSIS — R739 Hyperglycemia, unspecified: Secondary | ICD-10-CM | POA: Diagnosis not present

## 2022-12-25 DIAGNOSIS — F411 Generalized anxiety disorder: Secondary | ICD-10-CM | POA: Diagnosis not present

## 2022-12-25 DIAGNOSIS — Z79899 Other long term (current) drug therapy: Secondary | ICD-10-CM | POA: Diagnosis not present

## 2022-12-25 DIAGNOSIS — R000 Tachycardia, unspecified: Secondary | ICD-10-CM | POA: Diagnosis not present

## 2022-12-25 DIAGNOSIS — R1110 Vomiting, unspecified: Secondary | ICD-10-CM | POA: Diagnosis not present

## 2022-12-25 DIAGNOSIS — Z7982 Long term (current) use of aspirin: Secondary | ICD-10-CM | POA: Diagnosis not present

## 2022-12-25 DIAGNOSIS — N1832 Chronic kidney disease, stage 3b: Secondary | ICD-10-CM | POA: Diagnosis not present

## 2022-12-25 DIAGNOSIS — R112 Nausea with vomiting, unspecified: Secondary | ICD-10-CM | POA: Diagnosis not present

## 2022-12-26 DIAGNOSIS — E876 Hypokalemia: Secondary | ICD-10-CM | POA: Diagnosis not present

## 2022-12-26 DIAGNOSIS — E1110 Type 2 diabetes mellitus with ketoacidosis without coma: Secondary | ICD-10-CM | POA: Diagnosis not present

## 2022-12-26 DIAGNOSIS — N1832 Chronic kidney disease, stage 3b: Secondary | ICD-10-CM | POA: Diagnosis not present

## 2022-12-26 DIAGNOSIS — E039 Hypothyroidism, unspecified: Secondary | ICD-10-CM | POA: Diagnosis not present

## 2022-12-27 DIAGNOSIS — E876 Hypokalemia: Secondary | ICD-10-CM | POA: Diagnosis not present

## 2022-12-27 DIAGNOSIS — N1832 Chronic kidney disease, stage 3b: Secondary | ICD-10-CM | POA: Diagnosis not present

## 2022-12-27 DIAGNOSIS — E039 Hypothyroidism, unspecified: Secondary | ICD-10-CM | POA: Diagnosis not present

## 2022-12-27 DIAGNOSIS — E1110 Type 2 diabetes mellitus with ketoacidosis without coma: Secondary | ICD-10-CM | POA: Diagnosis not present

## 2022-12-28 DIAGNOSIS — E1110 Type 2 diabetes mellitus with ketoacidosis without coma: Secondary | ICD-10-CM | POA: Diagnosis not present

## 2022-12-28 DIAGNOSIS — E039 Hypothyroidism, unspecified: Secondary | ICD-10-CM | POA: Diagnosis not present

## 2022-12-28 DIAGNOSIS — E876 Hypokalemia: Secondary | ICD-10-CM | POA: Diagnosis not present

## 2022-12-28 DIAGNOSIS — N1832 Chronic kidney disease, stage 3b: Secondary | ICD-10-CM | POA: Diagnosis not present

## 2023-01-04 DIAGNOSIS — E1069 Type 1 diabetes mellitus with other specified complication: Secondary | ICD-10-CM | POA: Diagnosis not present

## 2023-01-04 DIAGNOSIS — Z09 Encounter for follow-up examination after completed treatment for conditions other than malignant neoplasm: Secondary | ICD-10-CM | POA: Diagnosis not present

## 2023-01-04 DIAGNOSIS — I5032 Chronic diastolic (congestive) heart failure: Secondary | ICD-10-CM | POA: Diagnosis not present

## 2023-01-04 DIAGNOSIS — I951 Orthostatic hypotension: Secondary | ICD-10-CM | POA: Diagnosis not present

## 2023-01-06 ENCOUNTER — Inpatient Hospital Stay (HOSPITAL_COMMUNITY)
Admission: EM | Admit: 2023-01-06 | Discharge: 2023-01-08 | DRG: 074 | Disposition: A | Discharge status: Home / Self Care | Payer: BC Managed Care – PPO | Attending: Family Medicine | Admitting: Family Medicine

## 2023-01-06 ENCOUNTER — Emergency Department (HOSPITAL_COMMUNITY): Payer: BC Managed Care – PPO

## 2023-01-06 ENCOUNTER — Encounter (HOSPITAL_COMMUNITY): Payer: Self-pay

## 2023-01-06 ENCOUNTER — Other Ambulatory Visit: Payer: Self-pay

## 2023-01-06 DIAGNOSIS — E039 Hypothyroidism, unspecified: Secondary | ICD-10-CM | POA: Diagnosis not present

## 2023-01-06 DIAGNOSIS — E7800 Pure hypercholesterolemia, unspecified: Secondary | ICD-10-CM | POA: Diagnosis not present

## 2023-01-06 DIAGNOSIS — E876 Hypokalemia: Secondary | ICD-10-CM | POA: Diagnosis present

## 2023-01-06 DIAGNOSIS — R339 Retention of urine, unspecified: Secondary | ICD-10-CM | POA: Diagnosis present

## 2023-01-06 DIAGNOSIS — R739 Hyperglycemia, unspecified: Secondary | ICD-10-CM | POA: Diagnosis not present

## 2023-01-06 DIAGNOSIS — K219 Gastro-esophageal reflux disease without esophagitis: Secondary | ICD-10-CM | POA: Diagnosis present

## 2023-01-06 DIAGNOSIS — N3000 Acute cystitis without hematuria: Secondary | ICD-10-CM | POA: Diagnosis present

## 2023-01-06 DIAGNOSIS — Z79890 Hormone replacement therapy (postmenopausal): Secondary | ICD-10-CM

## 2023-01-06 DIAGNOSIS — Z794 Long term (current) use of insulin: Secondary | ICD-10-CM

## 2023-01-06 DIAGNOSIS — Z833 Family history of diabetes mellitus: Secondary | ICD-10-CM

## 2023-01-06 DIAGNOSIS — E1142 Type 2 diabetes mellitus with diabetic polyneuropathy: Principal | ICD-10-CM | POA: Diagnosis present

## 2023-01-06 DIAGNOSIS — E785 Hyperlipidemia, unspecified: Secondary | ICD-10-CM | POA: Diagnosis present

## 2023-01-06 DIAGNOSIS — T381X6A Underdosing of thyroid hormones and substitutes, initial encounter: Secondary | ICD-10-CM | POA: Diagnosis present

## 2023-01-06 DIAGNOSIS — I5022 Chronic systolic (congestive) heart failure: Secondary | ICD-10-CM | POA: Diagnosis not present

## 2023-01-06 DIAGNOSIS — R110 Nausea: Secondary | ICD-10-CM

## 2023-01-06 DIAGNOSIS — Z79899 Other long term (current) drug therapy: Secondary | ICD-10-CM

## 2023-01-06 DIAGNOSIS — I428 Other cardiomyopathies: Secondary | ICD-10-CM | POA: Diagnosis not present

## 2023-01-06 DIAGNOSIS — E871 Hypo-osmolality and hyponatremia: Secondary | ICD-10-CM | POA: Diagnosis not present

## 2023-01-06 DIAGNOSIS — E1100 Type 2 diabetes mellitus with hyperosmolarity without nonketotic hyperglycemic-hyperosmolar coma (NKHHC): Secondary | ICD-10-CM | POA: Diagnosis not present

## 2023-01-06 DIAGNOSIS — E860 Dehydration: Secondary | ICD-10-CM | POA: Insufficient documentation

## 2023-01-06 DIAGNOSIS — Z885 Allergy status to narcotic agent status: Secondary | ICD-10-CM

## 2023-01-06 DIAGNOSIS — E873 Alkalosis: Secondary | ICD-10-CM | POA: Diagnosis not present

## 2023-01-06 DIAGNOSIS — R109 Unspecified abdominal pain: Secondary | ICD-10-CM | POA: Diagnosis not present

## 2023-01-06 DIAGNOSIS — I10 Essential (primary) hypertension: Secondary | ICD-10-CM | POA: Diagnosis not present

## 2023-01-06 DIAGNOSIS — N179 Acute kidney failure, unspecified: Secondary | ICD-10-CM | POA: Diagnosis not present

## 2023-01-06 DIAGNOSIS — E1165 Type 2 diabetes mellitus with hyperglycemia: Secondary | ICD-10-CM | POA: Diagnosis not present

## 2023-01-06 DIAGNOSIS — E1143 Type 2 diabetes mellitus with diabetic autonomic (poly)neuropathy: Secondary | ICD-10-CM | POA: Diagnosis present

## 2023-01-06 DIAGNOSIS — F411 Generalized anxiety disorder: Secondary | ICD-10-CM | POA: Diagnosis present

## 2023-01-06 DIAGNOSIS — Z8249 Family history of ischemic heart disease and other diseases of the circulatory system: Secondary | ICD-10-CM

## 2023-01-06 DIAGNOSIS — K3184 Gastroparesis: Secondary | ICD-10-CM | POA: Diagnosis present

## 2023-01-06 DIAGNOSIS — E1122 Type 2 diabetes mellitus with diabetic chronic kidney disease: Secondary | ICD-10-CM | POA: Diagnosis present

## 2023-01-06 DIAGNOSIS — E1149 Type 2 diabetes mellitus with other diabetic neurological complication: Secondary | ICD-10-CM | POA: Diagnosis present

## 2023-01-06 DIAGNOSIS — Z91138 Patient's unintentional underdosing of medication regimen for other reason: Secondary | ICD-10-CM

## 2023-01-06 DIAGNOSIS — Z87891 Personal history of nicotine dependence: Secondary | ICD-10-CM | POA: Diagnosis not present

## 2023-01-06 DIAGNOSIS — I951 Orthostatic hypotension: Secondary | ICD-10-CM | POA: Diagnosis present

## 2023-01-06 DIAGNOSIS — Z91199 Patient's noncompliance with other medical treatment and regimen due to unspecified reason: Secondary | ICD-10-CM

## 2023-01-06 DIAGNOSIS — Z91128 Patient's intentional underdosing of medication regimen for other reason: Secondary | ICD-10-CM | POA: Diagnosis not present

## 2023-01-06 DIAGNOSIS — R0902 Hypoxemia: Secondary | ICD-10-CM | POA: Diagnosis not present

## 2023-01-06 DIAGNOSIS — T383X6A Underdosing of insulin and oral hypoglycemic [antidiabetic] drugs, initial encounter: Secondary | ICD-10-CM | POA: Diagnosis present

## 2023-01-06 DIAGNOSIS — R112 Nausea with vomiting, unspecified: Secondary | ICD-10-CM | POA: Diagnosis not present

## 2023-01-06 DIAGNOSIS — Z820 Family history of epilepsy and other diseases of the nervous system: Secondary | ICD-10-CM

## 2023-01-06 DIAGNOSIS — N1832 Chronic kidney disease, stage 3b: Secondary | ICD-10-CM | POA: Diagnosis present

## 2023-01-06 DIAGNOSIS — R338 Other retention of urine: Secondary | ICD-10-CM

## 2023-01-06 DIAGNOSIS — Z7982 Long term (current) use of aspirin: Secondary | ICD-10-CM

## 2023-01-06 LAB — BASIC METABOLIC PANEL
Anion gap: 19 — ABNORMAL HIGH (ref 5–15)
Anion gap: 14 (ref 5–15)
BUN: 18 mg/dL (ref 6–20)
BUN: 17 mg/dL (ref 6–20)
CO2: 29 mmol/L (ref 22–32)
CO2: 30 mmol/L (ref 22–32)
Calcium: 9.4 mg/dL (ref 8.9–10.3)
Calcium: 9.1 mg/dL (ref 8.9–10.3)
Chloride: 85 mmol/L — ABNORMAL LOW (ref 98–111)
Chloride: 91 mmol/L — ABNORMAL LOW (ref 98–111)
Creatinine, Ser: 0.91 mg/dL (ref 0.44–1.00)
Creatinine, Ser: 0.75 mg/dL (ref 0.44–1.00)
GFR, Estimated: >60 mL/min (ref >60)
GFR, Estimated: >60 mL/min (ref >60)
Glucose, Bld: 493 mg/dL — ABNORMAL HIGH (ref 70–99)
Glucose, Bld: 280 mg/dL — ABNORMAL HIGH (ref 70–99)
Potassium: 3.3 mmol/L — ABNORMAL LOW (ref 3.5–5.1)
Potassium: 3.2 mmol/L — ABNORMAL LOW (ref 3.5–5.1)
Sodium: 133 mmol/L — ABNORMAL LOW (ref 135–145)
Sodium: 135 mmol/L (ref 135–145)

## 2023-01-06 LAB — HEPATIC FUNCTION PANEL
ALT: 22 U/L (ref 0–44)
AST: 35 U/L (ref 15–41)
Albumin: 4.2 g/dL (ref 3.5–5.0)
Alkaline Phosphatase: 81 U/L (ref 38–126)
Bilirubin, Direct: <0.1 mg/dL (ref 0.0–0.2)
Total Bilirubin: 0.6 mg/dL (ref 0.3–1.2)
Total Protein: 8.3 g/dL — ABNORMAL HIGH (ref 6.5–8.1)

## 2023-01-06 LAB — URINALYSIS, ROUTINE W REFLEX MICROSCOPIC
Bilirubin Urine: NEGATIVE
Glucose, UA: >=500 mg/dL — AB
Hgb urine dipstick: NEGATIVE
Ketones, ur: 20 mg/dL — AB
Leukocytes,Ua: NEGATIVE
Nitrite: NEGATIVE
Protein, ur: 30 mg/dL — AB
Specific Gravity, Urine: 1.026 (ref 1.005–1.030)
pH: 7 (ref 5.0–8.0)

## 2023-01-06 LAB — CBG MONITORING, ED
Glucose-Capillary: 457 mg/dL — ABNORMAL HIGH (ref 70–99)
Glucose-Capillary: 318 mg/dL — ABNORMAL HIGH (ref 70–99)

## 2023-01-06 LAB — GLUCOSE, CAPILLARY
Glucose-Capillary: 171 mg/dL — ABNORMAL HIGH (ref 70–99)
Glucose-Capillary: 257 mg/dL — ABNORMAL HIGH (ref 70–99)

## 2023-01-06 LAB — BLOOD GAS, VENOUS
Acid-Base Excess: 8.6 mmol/L — ABNORMAL HIGH (ref 0.0–2.0)
Bicarbonate: 33.5 mmol/L — ABNORMAL HIGH (ref 20.0–28.0)
O2 Saturation: 80.7 %
Patient temperature: 37
pCO2, Ven: 46 mmHg (ref 44–60)
pH, Ven: 7.47 — ABNORMAL HIGH (ref 7.25–7.43)
pO2, Ven: 47 mmHg — ABNORMAL HIGH (ref 32–45)

## 2023-01-06 LAB — CBC WITH DIFFERENTIAL/PLATELET
Abs Immature Granulocytes: 0.06 10*3/uL (ref 0.00–0.07)
Basophils Absolute: 0 10*3/uL (ref 0.0–0.1)
Basophils Relative: 0 %
Eosinophils Absolute: 0 10*3/uL (ref 0.0–0.5)
Eosinophils Relative: 0 %
HCT: 43.1 % (ref 36.0–46.0)
Hemoglobin: 14.5 g/dL (ref 12.0–15.0)
Immature Granulocytes: 1 %
Lymphocytes Relative: 10 %
Lymphs Abs: 1.2 10*3/uL (ref 0.7–4.0)
MCH: 28.4 pg (ref 26.0–34.0)
MCHC: 33.6 g/dL (ref 30.0–36.0)
MCV: 84.3 fL (ref 80.0–100.0)
Monocytes Absolute: 0.3 10*3/uL (ref 0.1–1.0)
Monocytes Relative: 3 %
Neutro Abs: 10.9 10*3/uL — ABNORMAL HIGH (ref 1.7–7.7)
Neutrophils Relative %: 86 %
Platelets: 426 10*3/uL — ABNORMAL HIGH (ref 150–400)
RBC: 5.11 MIL/uL (ref 3.87–5.11)
RDW: 14.8 % (ref 11.5–15.5)
WBC: 12.5 10*3/uL — ABNORMAL HIGH (ref 4.0–10.5)
nRBC: 0 % (ref 0.0–0.2)

## 2023-01-06 LAB — TSH: TSH: 35.738 u[IU]/mL — ABNORMAL HIGH (ref 0.350–4.500)

## 2023-01-06 LAB — BETA-HYDROXYBUTYRIC ACID: Beta-Hydroxybutyric Acid: 0.54 mmol/L — ABNORMAL HIGH (ref 0.05–0.27)

## 2023-01-06 LAB — MRSA NEXT GEN BY PCR, NASAL: MRSA by PCR Next Gen: NOT DETECTED

## 2023-01-06 LAB — LIPASE, BLOOD: Lipase: 40 U/L (ref 11–51)

## 2023-01-06 MED ORDER — LACTATED RINGERS IV SOLN: INTRAVENOUS | Status: DC

## 2023-01-06 MED ORDER — CHLORHEXIDINE GLUCONATE CLOTH 2 % EX PADS
6.0000 | MEDICATED_PAD | Freq: Every day | CUTANEOUS | Status: DC
  Administered 2023-01-06 – 2023-01-08 (×3): 6 via TOPICAL

## 2023-01-06 MED ORDER — PROMETHAZINE HCL 25 MG PO TABS
25.0000 mg | ORAL_TABLET | Freq: Once | ORAL | Status: DC
  Filled 2023-01-06: qty 1

## 2023-01-06 MED ORDER — DIPHENHYDRAMINE HCL 50 MG/ML IJ SOLN
12.5000 mg | Freq: Once | INTRAMUSCULAR | Status: AC
  Administered 2023-01-06: 12.5 mg via INTRAVENOUS
  Filled 2023-01-06: qty 1

## 2023-01-06 MED ORDER — ONDANSETRON HCL 4 MG/2ML IJ SOLN
4.0000 mg | Freq: Four times a day (QID) | INTRAMUSCULAR | Status: DC | PRN
  Administered 2023-01-06 – 2023-01-07 (×3): 4 mg via INTRAVENOUS
  Filled 2023-01-06 (×3): qty 2

## 2023-01-06 MED ORDER — SODIUM CHLORIDE 0.9 % IV SOLN: INTRAVENOUS | Status: DC | PRN

## 2023-01-06 MED ORDER — ENOXAPARIN SODIUM 40 MG/0.4ML IJ SOSY
40.0000 mg | PREFILLED_SYRINGE | Freq: Every day | INTRAMUSCULAR | Status: DC
  Administered 2023-01-06 – 2023-01-07 (×2): 40 mg via SUBCUTANEOUS
  Filled 2023-01-06 (×2): qty 0.4

## 2023-01-06 MED ORDER — ORAL CARE MOUTH RINSE: 15.0000 mL | OROMUCOSAL | Status: DC | PRN

## 2023-01-06 MED ORDER — METOCLOPRAMIDE HCL 5 MG/ML IJ SOLN
10.0000 mg | Freq: Once | INTRAMUSCULAR | Status: AC
  Administered 2023-01-06: 10 mg via INTRAVENOUS
  Filled 2023-01-06: qty 2

## 2023-01-06 MED ORDER — POTASSIUM CHLORIDE 10 MEQ/100ML IV SOLN
10.0000 meq | INTRAVENOUS | Status: AC
  Administered 2023-01-06 (×3): 10 meq via INTRAVENOUS
  Filled 2023-01-06 (×3): qty 100

## 2023-01-06 MED ORDER — INSULIN ASPART 100 UNIT/ML IJ SOLN
0.0000 [IU] | INTRAMUSCULAR | Status: DC | PRN
  Administered 2023-01-06: 3 [IU] via SUBCUTANEOUS
  Administered 2023-01-06: 11 [IU] via SUBCUTANEOUS
  Administered 2023-01-07: 3 [IU] via SUBCUTANEOUS
  Filled 2023-01-06 (×2): qty 0.15

## 2023-01-06 MED ORDER — FENTANYL CITRATE PF 50 MCG/ML IJ SOSY
50.0000 ug | PREFILLED_SYRINGE | Freq: Once | INTRAMUSCULAR | Status: AC
  Administered 2023-01-06: 50 ug via INTRAVENOUS
  Filled 2023-01-06: qty 1

## 2023-01-06 MED ORDER — LACTATED RINGERS IV BOLUS
1000.0000 mL | INTRAVENOUS | Status: AC
  Administered 2023-01-06 (×2): 1000 mL via INTRAVENOUS

## 2023-01-06 NOTE — ED Provider Notes (Signed)
[place] EMERGENCY DEPARTMENT AT [facility] Provider Note   CSN: [id number] Arrival date & time: 01/06/23  1639     History  Chief Complaint  Patient presents with   Hyperglycemia   Abdominal Pain   Nausea   Emesis    Fani C Sakurai is a 62 y.o. female.  Patient here with elevated blood sugar, nausea and vomiting and abdominal pain.  History of pancreatitis, history of DKA.  History of diabetes with some noncompliance issues.  Recently admitted for same.  Patient denies any suspicious food intake or cough or sputum production.  She does not take insulin pump.  She has a history of anxiety, high cholesterol.  She feels like she has pancreatitis.  She also has known gastroparesis history.  Pain mostly in the upper abdomen.  Has had nausea and vomiting.  Has not been able to keep things down after a couple days.  The history is provided by the patient.       Home Medications Prior to Admission medications   Medication Sig Start Date End Date Taking? Authorizing Provider  aspirin EC 81 MG tablet Take 81 mg by mouth daily. Swallow whole.    [provider]  atorvastatin (LIPITOR) 80 MG tablet Take 1 tablet (80 mg total) by mouth daily. 01/12/22   Bedsole, Amy E, MD  cephALEXin (KEFLEX) 500 MG capsule Take 1 capsule (500 mg total) by mouth 3 (three) times daily. 08/14/22   Bedsole, Amy E, MD  Continuous Blood Gluc Sensor (DEXCOM G6 SENSOR) MISC INJECT 1 SENSOR INTO SKIN EVERY 10 DAYS 07/18/22   Bedsole, Amy E, MD  Continuous Blood Gluc Transmit (DEXCOM G6 TRANSMITTER) MISC USE AS DIRECTED FOR CONTINUOUS GLUCOSE MONITORING. REUSE TRANSMITTER X 90DAYS THEN DISCARD & REPLACE 07/05/21   Bedsole, Amy E, MD  eletriptan (RELPAX) 40 MG tablet Take 1 tablet (40 mg total) by mouth as needed for migraine or headache. May repeat in 2 hours if headache persists or recurs. 08/14/22   Bedsole, Amy E, MD  insulin lispro (HUMALOG KWIKPEN) 100 UNIT/ML KwikPen INJECT INTO SKIN 3 TIMES A DAY  AT MEAL TIME ACCORDING TO THE FOLLOWING SLIDING SCALE: 27 UNITS MAX PER DAY DX E11.43 Patient taking differently: Inject 1-4 Units into the skin with breakfast, with lunch, and with evening meal. Per sliding scale. 02/26/22   Bedsole, Amy E, MD  Insulin Pen Needle (PEN NEEDLES) 31G X 8 MM MISC Use to inject insulin 4 times a day.  Dx: E11.10 07/21/21   Jinny Sanders, MD  Lancets (ONETOUCH ULTRASOFT) lancets Use to check blood sugar daily as needed.  Dx: E11.10 12/23/17   Jinny Sanders, MD  levothyroxine (SYNTHROID) 137 MCG tablet Take 959 mcg by mouth every Sunday. 03/19/22   [provider]  metoCLOPramide (REGLAN) 5 MG tablet Take 1 tablet (5 mg total) by mouth 4 (four) times daily -  before meals and at bedtime. 04/30/22   Milus Banister, MD  midodrine (PROAMATINE) 5 MG tablet Take 1 tablet (5 mg total) by mouth 2 (two) times daily with a meal. 03/02/22   Minus Breeding, MD  pantoprazole (PROTONIX) 40 MG tablet Take 1 tablet (40 mg total) by mouth 2 (two) times daily before a meal. 04/30/22   Milus Banister, MD  promethazine (PHENERGAN) 25 MG tablet Take 1 tablet (25 mg total) by mouth every 8 (eight) hours as needed for nausea or vomiting. 01/05/22   Diona Browner, Amy E, MD  sucralfate (CARAFATE) 1  g tablet Take 1 tablet (1 g total) by mouth 4 (four) times daily. 03/29/22 03/29/23  Regalado, Belkys A, MD  TOUJEO SOLOSTAR 300 UNIT/ML Solostar Pen Inject 6 Units into the skin at bedtime. 03/29/22   Regalado, Cassie Freer, MD      Allergies    Codeine    Review of Systems   Review of Systems  Physical Exam Updated Vital Signs BP (!) 138/95   Pulse (!) 104   Temp 98.2 F (36.8 C) (Oral)   Resp 13   Ht 4' 11.5" (1.511 m)   Wt 56.7 kg   LMP 08/11/2012   SpO2 100%   BMI 24.82 kg/m  Physical Exam Vitals and nursing note reviewed.  Constitutional:      General: She is not in acute distress.    Appearance: She is well-developed. She is not ill-appearing.  HENT:     Head: Normocephalic and  atraumatic.     Mouth/Throat:     Mouth: Mucous membranes are moist.  Eyes:     Extraocular Movements: Extraocular movements intact.     Conjunctiva/sclera: Conjunctivae normal.  Cardiovascular:     Rate and Rhythm: Normal rate and regular rhythm.     Heart sounds: Normal heart sounds. No murmur heard. Pulmonary:     Effort: Pulmonary effort is normal. No respiratory distress.     Breath sounds: Normal breath sounds.  Abdominal:     General: Abdomen is flat.     Palpations: Abdomen is soft.     Tenderness: There is abdominal tenderness in the epigastric area. There is guarding.     Hernia: No hernia is present.  Musculoskeletal:        General: No swelling.     Cervical back: Neck supple.  Skin:    General: Skin is warm and dry.     Capillary Refill: Capillary refill takes less than 2 seconds.  Neurological:     General: No focal deficit present.     Mental Status: She is alert.  Psychiatric:        Mood and Affect: Mood normal.     ED Results / Procedures / Treatments   Labs (all labs ordered are listed, but only abnormal results are displayed) Labs Reviewed  BASIC METABOLIC PANEL - Abnormal; Notable for the following components:      Result Value   Sodium 133 (*)    Potassium 3.3 (*)    Chloride 85 (*)    Glucose, Bld 493 (*)    Anion gap 19 (*)    All other components within normal limits  BETA-HYDROXYBUTYRIC ACID - Abnormal; Notable for the following components:   Beta-Hydroxybutyric Acid 0.54 (*)    All other components within normal limits  CBC WITH DIFFERENTIAL/PLATELET - Abnormal; Notable for the following components:   WBC 12.5 (*)    Platelets 426 (*)    Neutro Abs 10.9 (*)    All other components within normal limits  BLOOD GAS, VENOUS - Abnormal; Notable for the following components:   pH, Ven 7.47 (*)    pO2, Ven 47 (*)    Bicarbonate 33.5 (*)    Acid-Base Excess 8.6 (*)    All other components within normal limits  HEPATIC FUNCTION PANEL -  Abnormal; Notable for the following components:   Total Protein 8.3 (*)    All other components within normal limits  CBG MONITORING, ED - Abnormal; Notable for the following components:   Glucose-Capillary 457 (*)    All  other components within normal limits  LIPASE, BLOOD  BASIC METABOLIC PANEL  BASIC METABOLIC PANEL  BASIC METABOLIC PANEL  BETA-HYDROXYBUTYRIC ACID  URINALYSIS, ROUTINE W REFLEX MICROSCOPIC  I-STAT BETA HCG BLOOD, ED ([facility], [facility], [facility] ONLY)    EKG EKG Interpretation  Date/Time:  Sunday January 06 2023 16:51:58 EST Ventricular Rate:  99 PR Interval:  165 QRS Duration: 89 QT Interval:  368 QTC Calculation: 473 R Axis:   90 Text Interpretation: Sinus rhythm Borderline right axis deviation Confirmed by Lennice Sites ([id number]) on 01/06/2023 5:25:25 PM  Radiology CT ABDOMEN PELVIS WO CONTRAST  Result Date: 01/06/2023 CLINICAL DATA:  Acute abdominal pain and vomiting. EXAM: CT ABDOMEN AND PELVIS WITHOUT CONTRAST TECHNIQUE: Multidetector CT imaging of the abdomen and pelvis was performed following the standard protocol without IV contrast. RADIATION DOSE REDUCTION: This exam was performed according to the departmental dose-optimization program which includes automated exposure control, adjustment of the mA and/or kV according to patient size and/or use of iterative reconstruction technique. COMPARISON:  05/10/2022 FINDINGS: Lower chest: No acute findings. Hepatobiliary: No mass visualized on this unenhanced exam. Gallbladder is unremarkable. No evidence of biliary ductal dilatation. Pancreas: No mass or inflammatory process visualized on this unenhanced exam. Spleen:  Within normal limits in size. Adrenals/Urinary tract: No evidence of urolithiasis or hydronephrosis. Marked dilatation of the urinary bladder is seen, suspicious for urinary retention. Stomach/Bowel: No evidence of obstruction, inflammatory process, or abnormal fluid collections. Diffuse colonic diverticulosis is seen,  without evidence of diverticulitis. Vascular/Lymphatic: No pathologically enlarged lymph nodes identified. No evidence of abdominal aortic aneurysm. Aortic atherosclerotic calcification incidentally noted. Reproductive: Prior hysterectomy noted. Adnexal regions are unremarkable in appearance. Other:  None. Musculoskeletal:  No suspicious bone lesions identified. IMPRESSION: Marked dilatation of urinary bladder, suspicious for urinary retention. No evidence of urolithiasis, hydronephrosis, or other acute findings. Colonic diverticulosis, without radiographic evidence of diverticulitis. Electronically Signed   By: Marlaine Hind M.D.   On: 01/06/2023 17:48    Procedures Procedures    Medications Ordered in ED Medications  potassium chloride 10 mEq in 100 mL IVPB (has no administration in time range)  ondansetron (ZOFRAN) injection 4 mg (has no administration in time range)  lactated ringers bolus 1,000 mL (1,000 mLs Intravenous New Bag/Given 01/06/23 1829)  fentaNYL (SUBLIMAZE) injection 50 mcg (50 mcg Intravenous Given 01/06/23 1717)  metoCLOPramide (REGLAN) injection 10 mg (10 mg Intravenous Given 01/06/23 1718)  diphenhydrAMINE (BENADRYL) injection 12.5 mg (12.5 mg Intravenous Given 01/06/23 1718)    ED Course/ Medical Decision Making/ A&P                             Medical Decision Making Amount and/or Complexity of Data Reviewed Labs: ordered. Radiology: ordered.  Risk Prescription drug management. Decision regarding hospitalization.   Ileigh C Crafts is here with nausea vomiting abdominal pain high blood sugar.  Normal vitals.  No fever.  Blood sugar in the 400s.  Differential diagnosis is DKA in the setting of noncompliance versus pancreatitis versus gastroparesis.  Denies any suspicious food intake.  Will get CBC, CMP, lipase, ketones, urinalysis, CT scan abdomen pelvis, EKG.  EKG shows sinus rhythm per my review and interpretation.  No ischemic changes.  Will give 2 L lactated ringer  bolus, IV fentanyl, IV Benadryl and IV Reglan and await blood work prior to starting insulin.  Patient overall per my review and interpretation labs here with blood sugar of 493, anion gap 19 however bicarb  and pH are normal.  She has positive ketones.  She does not appear to be in DKA but she has significant metabolic derangements.  CT scan abdomen pelvis per radiology report shows no acute findings.  She has mild leukocytosis.  Lab work is otherwise unremarkable.  My suspicion is that she is having gastroparesis/uncontrolled nausea vomiting causing hyperglycemia and she is at risk to develop DKA if we do not admit for further hydration, symptomatic control and hyperglycemic control.  Patient to be admitted to medicine for further care.  This chart was dictated using voice recognition software.  Despite best efforts to proofread,  errors can occur which can change the documentation meaning.         Final Clinical Impression(s) / ED Diagnoses Final diagnoses:  Hyperglycemia  Nausea  Dehydration    Rx / DC Orders ED Discharge Orders     None         Lennice Sites, DO 01/06/23 1854

## 2023-01-06 NOTE — Assessment & Plan Note (Signed)
-  K of 3.3 -replete with IV potassium

## 2023-01-06 NOTE — Assessment & Plan Note (Addendum)
pH normal on admission, started on insulin and glucoses normalized. - Continue IVF - Continue aspart corrections

## 2023-01-06 NOTE — Assessment & Plan Note (Signed)
Minimal rise in creatinine is due to dehydration, not kidney injury.  Chronic kidney disease ruled out. - Continue IV fluids

## 2023-01-06 NOTE — H&P (Signed)
History and Physical    Patient: [name] [name] NTI:[id number] DOB: Jun 11, 1961 DOA: 01/06/2023 DOS: the patient was seen and examined on 01/06/2023 PCP: Rockne Menghini, NP  Patient coming from: Home  Chief Complaint:  Chief Complaint  Patient presents with   Hyperglycemia   Abdominal Pain   Nausea   Emesis   HPI: [name] [name] is a 62 y.o. female with medical history significant of NICM, insulin dependent Type1 DM with gastroparesis, peripheral neuropathy, HLD, CKD3b, hypothyroidism, orthostatic hypotension who presents with abdominal pain and nausea and vomiting.   Started to have epigastric abdominal pain a few days ago and then earlier today begin to have nausea and persistent vomiting. Cannot even keep water down. Notes dysuria. Denies alcohol. Had marijuana a few days ago.  Patient was just admitted from 12/25/22-12/28/22 at [place] for DKA. Anion gap was closed with just fluid before insulin infusion was started. States she does 6U of Humalog with meals. She was following with endocrinology at [facility] but per record has not seen them since 03/2022 and she reports being in process to switch to someone in the [place] system.   In the ED today, afebrile, temperature of 98.2, HR of 104, BP of 138/95.  CBG of 493 with anion gap of 19.pH is alkalotic at 7.47 with bicarb of 33. Beta-hydroxy elevated at 0.54. Pseudohyponatremia of 133, potassium of 3.3, creatinine elevated at 0.91 from prior of 0.66.   CT Abd/pelvis with marked dilatation of the bladder concerning for possible urinary retention but no other significant findings.   Review of Systems: As mentioned in the history of present illness. All other systems reviewed and are negative. Past Medical History:  Diagnosis Date   Allergy    seasonal   Anxiety    B12 deficiency    Chest pain 04/05/2017   CHF (congestive heart failure) ([place])    Diabetes mellitus type 1, uncontrolled    "dr. just changed me to type 1,  uncontrolled" (11/26/2018)   DKA (diabetic ketoacidoses) 05/25/2018   GERD (gastroesophageal reflux disease)    Hyperlipidemia    Hypertension    Hypothyroidism    IBS (irritable bowel syndrome)    Intermittent vertigo    Kidney disease, chronic, stage 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) 10/29/2013   Leg cramping    "@ night" (09/18/2013)   Migraine    "once q couple months" (11/26/2018)   Osteoporosis    Peripheral neuropathy    Umbilical hernia    unrepaired (09/18/2013)   Past Surgical History:  Procedure Laterality Date   BIOPSY  06/03/2019   Procedure: BIOPSY;  Surgeon: Jackquline Denmark, MD;  Location: [facility];  Service: Endoscopy;;   BIOPSY  08/17/2020   Procedure: BIOPSY;  Surgeon: Thornton Park, MD;  Location: [facility];  Service: Gastroenterology;;   [place]   ENTEROSCOPY N/A 06/03/2019   Procedure: ENTEROSCOPY;  Surgeon: Jackquline Denmark, MD;  Location: [facility];  Service: Endoscopy;  Laterality: N/A;   ESOPHAGOGASTRODUODENOSCOPY (EGD) WITH PROPOFOL N/A 08/17/2020   Procedure: ESOPHAGOGASTRODUODENOSCOPY (EGD) WITH PROPOFOL;  Surgeon: Thornton Park, MD;  Location: [facility];  Service: Gastroenterology;  Laterality: N/A;   LAPAROSCOPIC ASSISTED VAGINAL HYSTERECTOMY  09/23/2012   Procedure: LAPAROSCOPIC ASSISTED VAGINAL HYSTERECTOMY;  Surgeon: Linda Hedges, DO;  Location: [facility];  Service: Gynecology;  Laterality: N/A;  pull Dr Gregor Hams instrument   LEFT HEART CATH AND CORONARY ANGIOGRAPHY N/A 02/11/2017   Procedure: Left Heart Cath and Coronary Angiography;  Surgeon: Lorretta Harp,  MD;  Location: [facility];  Service: Cardiovascular;  Laterality: N/A;   TUBAL LIGATION Bilateral 1992   Social History:  reports that she quit smoking about 32 years ago. Her smoking use included cigarettes. She has a 1.20 pack-year smoking history. She has never used smokeless tobacco. She reports current drug use. Drug: Marijuana. She reports that she does not drink  alcohol.  Allergies  Allergen Reactions   Codeine Hives, Anxiety and Other (See Comments)    Family History  Problem Relation Age of Onset   Diabetes Other    Heart disease Other    Heart failure Mother 32       Her heart skipped a beat and stopped - per pt   Diabetes Maternal Grandmother    Alzheimer's disease Maternal Grandmother    Coronary artery disease Maternal Grandmother    Heart attack Maternal Grandfather    Heart failure Brother    Diabetes Brother    Colon polyps Neg Hx    Esophageal cancer Neg Hx    Liver cancer Neg Hx    Pancreatic cancer Neg Hx    Stomach cancer Neg Hx    Crohn's disease Neg Hx    Rectal cancer Neg Hx     Prior to Admission medications   Medication Sig Start Date End Date Taking? Authorizing Provider  aspirin EC 81 MG tablet Take 81 mg by mouth daily. Swallow whole.    [provider]  atorvastatin (LIPITOR) 80 MG tablet Take 1 tablet (80 mg total) by mouth daily. 01/12/22   Bedsole, Amy E, MD  cephALEXin (KEFLEX) 500 MG capsule Take 1 capsule (500 mg total) by mouth 3 (three) times daily. 08/14/22   Bedsole, Amy E, MD  Continuous Blood Gluc Sensor (DEXCOM G6 SENSOR) MISC INJECT 1 SENSOR INTO SKIN EVERY 10 DAYS 07/18/22   Bedsole, Amy E, MD  Continuous Blood Gluc Transmit (DEXCOM G6 TRANSMITTER) MISC USE AS DIRECTED FOR CONTINUOUS GLUCOSE MONITORING. REUSE TRANSMITTER X 90DAYS THEN DISCARD & REPLACE 07/05/21   Bedsole, Amy E, MD  eletriptan (RELPAX) 40 MG tablet Take 1 tablet (40 mg total) by mouth as needed for migraine or headache. May repeat in 2 hours if headache persists or recurs. 08/14/22   Bedsole, Amy E, MD  insulin lispro (HUMALOG KWIKPEN) 100 UNIT/ML KwikPen INJECT INTO SKIN 3 TIMES A DAY AT MEAL TIME ACCORDING TO THE FOLLOWING SLIDING SCALE: 27 UNITS MAX PER DAY DX E11.43 Patient taking differently: Inject 1-4 Units into the skin with breakfast, with lunch, and with evening meal. Per sliding scale. 02/26/22   Bedsole, Amy E, MD  Insulin  Pen Needle (PEN NEEDLES) 31G X 8 MM MISC Use to inject insulin 4 times a day.  Dx: E11.10 07/21/21   Jinny Sanders, MD  Lancets (ONETOUCH ULTRASOFT) lancets Use to check blood sugar daily as needed.  Dx: E11.10 12/23/17   Jinny Sanders, MD  levothyroxine (SYNTHROID) 137 MCG tablet Take 959 mcg by mouth every Sunday. 03/19/22   [provider]  metoCLOPramide (REGLAN) 5 MG tablet Take 1 tablet (5 mg total) by mouth 4 (four) times daily -  before meals and at bedtime. 04/30/22   Milus Banister, MD  midodrine (PROAMATINE) 5 MG tablet Take 1 tablet (5 mg total) by mouth 2 (two) times daily with a meal. 03/02/22   Minus Breeding, MD  pantoprazole (PROTONIX) 40 MG tablet Take 1 tablet (40 mg total) by mouth 2 (two) times daily before a meal.  04/30/22   Milus Banister, MD  promethazine (PHENERGAN) 25 MG tablet Take 1 tablet (25 mg total) by mouth every 8 (eight) hours as needed for nausea or vomiting. 01/05/22   Bedsole, Amy E, MD  sucralfate (CARAFATE) 1 g tablet Take 1 tablet (1 g total) by mouth 4 (four) times daily. 03/29/22 03/29/23  Regalado, Belkys A, MD  TOUJEO SOLOSTAR 300 UNIT/ML Solostar Pen Inject 6 Units into the skin at bedtime. 03/29/22   Elmarie Shiley, MD    Physical Exam: Vitals:   01/06/23 1647 01/06/23 1649 01/06/23 1800 01/06/23 2100  BP:  (!) 132/99 (!) 138/95   Pulse:  (!) 101 (!) 104   Resp:  12 13   Temp:  98.2 F (36.8 C)  99 F (37.2 C)  TempSrc:  Oral  Oral  SpO2:  100% 100%   Weight: 56.7 kg   55.8 kg  Height: 4' 11.5" (1.511 m)      Constitutional: NAD, calm, comfortable, non-toxic female appearing older than stated age Eyes: lids and conjunctivae normal ENMT: Mucous membranes are moist.  Neck: normal, supple Respiratory: clear to auscultation bilaterally, no wheezing, no crackles. Normal respiratory effort. No accessory muscle use.  Cardiovascular: Regular rate and rhythm, no murmurs / rubs / gallops. No extremity edema.   Abdomen: soft, moderate  distended, mild epigastric tenderness.  Bowel sounds positive.  Musculoskeletal: no clubbing / cyanosis. No joint deformity upper and lower extremities.   Skin: no rashes, lesions, ulcers.  Neurologic: CN 2-12 grossly intact. Strength 5/5 in all 4.  Psychiatric: Normal judgment and insight. Alert and oriented x 3. Normal mood. Data Reviewed:  See HPI  Assessment and Plan: * Hyperosmolar hyperglycemic state (HHS) ([place]) Early HHS/hyperglycemic picture with elevated alkalotic pH, elevated anion gap and beta-hydroxy -has received 2L of fluid. Stat BMP shows closing anion cap. Will place on moderate SSI with continuous IV fluid.  -follow CBG q2hrs -last HbA1C 12.8 in 1/24. Previously follows with [facility] but she plans to switch to [place] provider. Reports taking 6U Humalog with meals daily.    Gastroparesis due to DM [facility]) -likely due to uncontrolled diabetes and questions her compliance with insulin regimen and routine follow up with endocrinology -blood glucose control as above -PRN antiemetics overnight  Acute-on-chronic kidney injury ([place]) -creatinine elevated at 0.91 from prior of 0.66.  -follow after fluids   Hypokalemia -K of 3.3 -replete with IV potassium  Hypothyroidism -on 144mg levothyroxine and takes all 7 on Sundays due to non-compliance during the week. Will have her try to take tomorrow once her p.o intake improves -check TSH level      Advance Care Planning:Full  Consults: none  Family Communication: none at bedside  Severity of Illness: The appropriate patient status for this patient is OBSERVATION. Observation status is judged to be reasonable and necessary in order to provide the required intensity of service to ensure the patient's safety. The patient's presenting symptoms, physical exam findings, and initial radiographic and laboratory data in the context of their medical condition is felt to place them at decreased risk for further  clinical deterioration. Furthermore, it is anticipated that the patient will be medically stable for discharge from the hospital within 2 midnights of admission.   Author: COrene Desanctis DO 01/06/2023 9:47 PM  For on call review [email or web address]

## 2023-01-06 NOTE — Assessment & Plan Note (Signed)
Remains in pain, unable to take anything by mouth, nauseated. - Resume IVF - Start scheduled Reglan - Avoid opiates - PRN phenergan and ondansetron for refractory nausea

## 2023-01-06 NOTE — Assessment & Plan Note (Signed)
TSH elevated due to noncompliance. - Continue levothyroxine - Recheck TSH in 2 weeks

## 2023-01-06 NOTE — ED Triage Notes (Signed)
Per EMS pt called for abdomen pain, n/v. Pt found to have BG 516. Pt recently dc for hospital due to increased BG. Pt reports taking her insulin as prescribed.    Given '4mg'$  Zofran with EMS  EMS VS 170/60 HR 100

## 2023-01-07 DIAGNOSIS — T381X6A Underdosing of thyroid hormones and substitutes, initial encounter: Secondary | ICD-10-CM | POA: Diagnosis present

## 2023-01-07 DIAGNOSIS — E1149 Type 2 diabetes mellitus with other diabetic neurological complication: Secondary | ICD-10-CM | POA: Diagnosis present

## 2023-01-07 DIAGNOSIS — E039 Hypothyroidism, unspecified: Secondary | ICD-10-CM | POA: Diagnosis present

## 2023-01-07 DIAGNOSIS — R339 Retention of urine, unspecified: Secondary | ICD-10-CM

## 2023-01-07 DIAGNOSIS — E7800 Pure hypercholesterolemia, unspecified: Secondary | ICD-10-CM | POA: Diagnosis present

## 2023-01-07 DIAGNOSIS — I428 Other cardiomyopathies: Secondary | ICD-10-CM | POA: Diagnosis present

## 2023-01-07 DIAGNOSIS — E1165 Type 2 diabetes mellitus with hyperglycemia: Secondary | ICD-10-CM | POA: Diagnosis present

## 2023-01-07 DIAGNOSIS — Z91128 Patient's intentional underdosing of medication regimen for other reason: Secondary | ICD-10-CM | POA: Diagnosis not present

## 2023-01-07 DIAGNOSIS — K3184 Gastroparesis: Secondary | ICD-10-CM | POA: Diagnosis present

## 2023-01-07 DIAGNOSIS — E1122 Type 2 diabetes mellitus with diabetic chronic kidney disease: Secondary | ICD-10-CM | POA: Diagnosis present

## 2023-01-07 DIAGNOSIS — Z87891 Personal history of nicotine dependence: Secondary | ICD-10-CM | POA: Diagnosis not present

## 2023-01-07 DIAGNOSIS — E860 Dehydration: Secondary | ICD-10-CM | POA: Diagnosis present

## 2023-01-07 DIAGNOSIS — R338 Other retention of urine: Secondary | ICD-10-CM

## 2023-01-07 DIAGNOSIS — F411 Generalized anxiety disorder: Secondary | ICD-10-CM | POA: Diagnosis present

## 2023-01-07 DIAGNOSIS — I951 Orthostatic hypotension: Secondary | ICD-10-CM

## 2023-01-07 DIAGNOSIS — K219 Gastro-esophageal reflux disease without esophagitis: Secondary | ICD-10-CM | POA: Diagnosis present

## 2023-01-07 DIAGNOSIS — I5022 Chronic systolic (congestive) heart failure: Secondary | ICD-10-CM | POA: Diagnosis present

## 2023-01-07 DIAGNOSIS — N3000 Acute cystitis without hematuria: Secondary | ICD-10-CM | POA: Diagnosis present

## 2023-01-07 DIAGNOSIS — Z794 Long term (current) use of insulin: Secondary | ICD-10-CM | POA: Diagnosis not present

## 2023-01-07 DIAGNOSIS — E1100 Type 2 diabetes mellitus with hyperosmolarity without nonketotic hyperglycemic-hyperosmolar coma (NKHHC): Secondary | ICD-10-CM | POA: Diagnosis not present

## 2023-01-07 DIAGNOSIS — E873 Alkalosis: Secondary | ICD-10-CM | POA: Diagnosis present

## 2023-01-07 DIAGNOSIS — E871 Hypo-osmolality and hyponatremia: Secondary | ICD-10-CM | POA: Diagnosis present

## 2023-01-07 DIAGNOSIS — N1832 Chronic kidney disease, stage 3b: Secondary | ICD-10-CM | POA: Diagnosis present

## 2023-01-07 DIAGNOSIS — E1143 Type 2 diabetes mellitus with diabetic autonomic (poly)neuropathy: Secondary | ICD-10-CM | POA: Diagnosis not present

## 2023-01-07 DIAGNOSIS — E1142 Type 2 diabetes mellitus with diabetic polyneuropathy: Secondary | ICD-10-CM | POA: Diagnosis present

## 2023-01-07 DIAGNOSIS — Z8249 Family history of ischemic heart disease and other diseases of the circulatory system: Secondary | ICD-10-CM | POA: Diagnosis not present

## 2023-01-07 DIAGNOSIS — E876 Hypokalemia: Secondary | ICD-10-CM | POA: Diagnosis present

## 2023-01-07 LAB — BASIC METABOLIC PANEL
Anion gap: 11 (ref 5–15)
Anion gap: 11 (ref 5–15)
Anion gap: 10 (ref 5–15)
BUN: 13 mg/dL (ref 6–20)
BUN: 11 mg/dL (ref 6–20)
BUN: 11 mg/dL (ref 6–20)
CO2: 30 mmol/L (ref 22–32)
CO2: 27 mmol/L (ref 22–32)
CO2: 26 mmol/L (ref 22–32)
Calcium: 8.8 mg/dL — ABNORMAL LOW (ref 8.9–10.3)
Calcium: 8.5 mg/dL — ABNORMAL LOW (ref 8.9–10.3)
Calcium: 8.6 mg/dL — ABNORMAL LOW (ref 8.9–10.3)
Chloride: 94 mmol/L — ABNORMAL LOW (ref 98–111)
Chloride: 94 mmol/L — ABNORMAL LOW (ref 98–111)
Chloride: 97 mmol/L — ABNORMAL LOW (ref 98–111)
Creatinine, Ser: 0.52 mg/dL (ref 0.44–1.00)
Creatinine, Ser: 0.48 mg/dL (ref 0.44–1.00)
Creatinine, Ser: 0.54 mg/dL (ref 0.44–1.00)
GFR, Estimated: >60 mL/min (ref >60)
GFR, Estimated: >60 mL/min (ref >60)
GFR, Estimated: >60 mL/min (ref >60)
Glucose, Bld: 183 mg/dL — ABNORMAL HIGH (ref 70–99)
Glucose, Bld: 192 mg/dL — ABNORMAL HIGH (ref 70–99)
Glucose, Bld: 169 mg/dL — ABNORMAL HIGH (ref 70–99)
Potassium: 3.2 mmol/L — ABNORMAL LOW (ref 3.5–5.1)
Potassium: 3 mmol/L — ABNORMAL LOW (ref 3.5–5.1)
Potassium: 3.1 mmol/L — ABNORMAL LOW (ref 3.5–5.1)
Sodium: 135 mmol/L (ref 135–145)
Sodium: 132 mmol/L — ABNORMAL LOW (ref 135–145)
Sodium: 133 mmol/L — ABNORMAL LOW (ref 135–145)

## 2023-01-07 LAB — CBC
HCT: 39.3 % (ref 36.0–46.0)
Hemoglobin: 13.1 g/dL (ref 12.0–15.0)
MCH: 28.6 pg (ref 26.0–34.0)
MCHC: 33.3 g/dL (ref 30.0–36.0)
MCV: 85.8 fL (ref 80.0–100.0)
Platelets: 344 10*3/uL (ref 150–400)
RBC: 4.58 MIL/uL (ref 3.87–5.11)
RDW: 15.2 % (ref 11.5–15.5)
WBC: 13.2 10*3/uL — ABNORMAL HIGH (ref 4.0–10.5)
nRBC: 0 % (ref 0.0–0.2)

## 2023-01-07 LAB — GLUCOSE, CAPILLARY
Glucose-Capillary: 170 mg/dL — ABNORMAL HIGH (ref 70–99)
Glucose-Capillary: 175 mg/dL — ABNORMAL HIGH (ref 70–99)
Glucose-Capillary: 133 mg/dL — ABNORMAL HIGH (ref 70–99)
Glucose-Capillary: 167 mg/dL — ABNORMAL HIGH (ref 70–99)
Glucose-Capillary: 158 mg/dL — ABNORMAL HIGH (ref 70–99)

## 2023-01-07 LAB — BETA-HYDROXYBUTYRIC ACID
Beta-Hydroxybutyric Acid: 0.57 mmol/L — ABNORMAL HIGH (ref 0.05–0.27)
Beta-Hydroxybutyric Acid: 0.14 mmol/L (ref 0.05–0.27)

## 2023-01-07 LAB — HEMOGLOBIN A1C
Hgb A1c MFr Bld: 13 % — ABNORMAL HIGH (ref 4.8–5.6)
Mean Plasma Glucose: 326.4 mg/dL

## 2023-01-07 MED ORDER — ACETAMINOPHEN 325 MG PO TABS
650.0000 mg | ORAL_TABLET | Freq: Four times a day (QID) | ORAL | Status: DC | PRN
  Administered 2023-01-07: 650 mg via ORAL
  Filled 2023-01-07 (×2): qty 2

## 2023-01-07 MED ORDER — ASPIRIN 81 MG PO TBEC
81.0000 mg | DELAYED_RELEASE_TABLET | Freq: Every day | ORAL | Status: DC
  Administered 2023-01-07 – 2023-01-08 (×2): 81 mg via ORAL
  Filled 2023-01-07 (×2): qty 1

## 2023-01-07 MED ORDER — OXYBUTYNIN CHLORIDE 5 MG PO TABS
5.0000 mg | ORAL_TABLET | Freq: Once | ORAL | Status: AC
  Administered 2023-01-07: 5 mg via ORAL
  Filled 2023-01-07: qty 1

## 2023-01-07 MED ORDER — POTASSIUM CHLORIDE 10 MEQ/100ML IV SOLN
10.0000 meq | INTRAVENOUS | Status: AC
  Administered 2023-01-07 (×4): 10 meq via INTRAVENOUS
  Filled 2023-01-07 (×4): qty 100

## 2023-01-07 MED ORDER — INSULIN GLARGINE-YFGN 100 UNIT/ML ~~LOC~~ SOLN
4.0000 [IU] | Freq: Every day | SUBCUTANEOUS | Status: DC
  Administered 2023-01-07 – 2023-01-08 (×2): 4 [IU] via SUBCUTANEOUS
  Filled 2023-01-07 (×2): qty 0.04

## 2023-01-07 MED ORDER — SODIUM CHLORIDE 0.9 % IV SOLN
12.5000 mg | Freq: Once | INTRAVENOUS | Status: AC
  Administered 2023-01-07: 12.5 mg via INTRAVENOUS
  Filled 2023-01-07: qty 12.5

## 2023-01-07 MED ORDER — SODIUM CHLORIDE 0.9 % IV SOLN: 12.5000 mg | Freq: Four times a day (QID) | INTRAVENOUS | Status: DC | PRN

## 2023-01-07 MED ORDER — ATORVASTATIN CALCIUM 40 MG PO TABS
80.0000 mg | ORAL_TABLET | Freq: Every day | ORAL | Status: DC
  Administered 2023-01-07 – 2023-01-08 (×2): 80 mg via ORAL
  Filled 2023-01-07 (×2): qty 2

## 2023-01-07 MED ORDER — PROMETHAZINE HCL 25 MG PO TABS: 25.0000 mg | ORAL_TABLET | Freq: Four times a day (QID) | ORAL | Status: DC | PRN

## 2023-01-07 MED ORDER — POTASSIUM CHLORIDE CRYS ER 20 MEQ PO TBCR: 40.0000 meq | EXTENDED_RELEASE_TABLET | Freq: Four times a day (QID) | ORAL | Status: DC

## 2023-01-07 MED ORDER — METOCLOPRAMIDE HCL 5 MG/ML IJ SOLN
10.0000 mg | Freq: Three times a day (TID) | INTRAMUSCULAR | Status: DC
  Administered 2023-01-07 – 2023-01-08 (×5): 10 mg via INTRAVENOUS
  Filled 2023-01-07 (×5): qty 2

## 2023-01-07 MED ORDER — INSULIN ASPART 100 UNIT/ML IJ SOLN
0.0000 [IU] | INTRAMUSCULAR | Status: DC
  Administered 2023-01-07: 2 [IU] via SUBCUTANEOUS
  Administered 2023-01-07 – 2023-01-08 (×4): 3 [IU] via SUBCUTANEOUS
  Administered 2023-01-08 (×3): 2 [IU] via SUBCUTANEOUS

## 2023-01-07 MED ORDER — POTASSIUM CHLORIDE 10 MEQ/100ML IV SOLN: 10.0000 meq | INTRAVENOUS | Status: DC

## 2023-01-07 MED ORDER — TRAMADOL HCL 50 MG PO TABS
50.0000 mg | ORAL_TABLET | Freq: Four times a day (QID) | ORAL | Status: DC | PRN
  Administered 2023-01-07 – 2023-01-08 (×2): 50 mg via ORAL
  Filled 2023-01-07 (×2): qty 1

## 2023-01-07 MED ORDER — POTASSIUM CHLORIDE 2 MEQ/ML IV SOLN
INTRAVENOUS | Status: DC
  Filled 2023-01-07 (×6): qty 1000

## 2023-01-07 NOTE — Assessment & Plan Note (Signed)
See above.    There is conflicting documentation, but her history (diagnosis in mid-40s, use of orals for several years) and her initial Endo consultation at [place] with Dr. 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

## 2023-01-07 NOTE — Assessment & Plan Note (Signed)
-  Hold midodrine until able to take PO

## 2023-01-07 NOTE — Plan of Care (Signed)

## 2023-01-07 NOTE — Inpatient Diabetes Management (Addendum)
Inpatient Diabetes Program Recommendations  AACE/ADA: New Consensus Statement on Inpatient Glycemic Control (2015)  Target Ranges:  Prepandial:   less than 140 mg/dL      Peak postprandial:   less than 180 mg/dL (1-2 hours)      Critically ill patients:  140 - 180 mg/dL    Latest Reference Range & Units 03/27/22 14:00 01/06/23 21:13  Hemoglobin A1C 4.8 - 5.6 % 11.4 (H) 13.0 (H)  326 mg/dl  (H): Data is abnormally high  Latest Reference Range & Units 01/06/23 17:05  Glucose 70 - 99 mg/dL 493 (H)  (H): Data is abnormally high  Latest Reference Range & Units 01/06/23 17:01 01/06/23 19:46 01/06/23 21:10 01/06/23 23:38 01/07/23 03:29 01/07/23 07:40  Glucose-Capillary 70 - 99 mg/dL 457 (H) 318 (H)  11 units Novolog  257 (H) 171 (H)  3 units Novolog  170 (H)  3 units Novolog  175 (H)  3 units Novolog   (H): Data is abnormally high     Admit:  Hyperosmolar hyperglycemic state Gastroparesis due to DM Acute-on-chronic kidney injury Hypokalemia  History: Type 1 Diabetes  Home Meds: Toujeo 6 units QHS     Humalog 1-4 units TID with meals per SSI    Dexcom G6  Current Orders: Novolog Moderate Correction Scale/ SSI (0-15 units) Q4 hours  Hemoglobin A1c pending    MD- please consider starting Semglee 5 units Daily this AM  Per records, pt taking Toujeo 6 units QHS at home for basal needs  Hx of Type 1 diabetes   ENDO: Dr. Denton Lank ([place]) Last Seen 03/19/2022 Was told to take the following: Increase Toujeo to 6 units nightly After 1 week, if you are not having any low sugars, increase it again to 8 units  Use the following pre-meal scale for your Humalog 200-250- 1 units 250-300- 2 units 300-350- 3 units >350- 4 units    Has F/U appt with ENDO Feb 07, 2023 11:00 AM Consultation with Gerome Apley, MD    Addendum 11:25am--Met w/ pt at bedside.  Pt confirmed she is supposed to be taking Toujeo and Humalog at home.  Pt did tell me that the hospital  [facility]) discharged her last week and told her NOT to take her Toujeo insulin so she has not been taking the Toujeo since 12/28/2022.  We discussed the fact that she has ketosis prone diabetes and must have basal insulin on board at home to prevent DKA.  Reviewed with pt that the Attending MD has added back basal insulin this AM.  Also Spoke with patient about her current A1c of 13%.  Explained what an A1c is and what it measures.  Reminded patient that her goal A1c is 7% or less per ADA standards to prevent both acute and long-term complications.  Explained to patient the extreme importance of good glucose control at home.  Encouraged patient to check her CBGs at least TID AC at home and to record all CBGs in a logbook for her Endocrinologist to review.  Pt stated she has follow up appt with Dr. Hartford Poli ([facility]) in February--Per AVS, appt is Feb 29.  Pt Not using the Dexcom right now but does have traditional CBG meter at home she is using and has insulin at home.      --Will follow patient during hospitalization--  Wyn Quaker RN, MSN, [place] Diabetes Coordinator Inpatient Glycemic Control Team Team Pager: [phone] (8a-5p)

## 2023-01-07 NOTE — Assessment & Plan Note (Signed)
Bladder "markedly" distended on admission CT.  No obstructing mass.    Here, poor UOP and bladder scan showed >999 PVR - Insert foley - Strict I/Os

## 2023-01-07 NOTE — Assessment & Plan Note (Signed)
-  Hold atorvastatin 

## 2023-01-07 NOTE — Progress Notes (Signed)
Progress Note   Patient: [name] [name] WJX:[id number] DOB: [birth year] DOA: 01/06/2023     0 DOS: the patient was seen and examined on 01/07/2023 at 8:22AM      Brief hospital course: Mrs. Colegrove is a 62 y.o. F with NICM, sCHF E recovered, DM, gastroparesis, CKD IIIb, hypothyroidism, and orthostatic hypotension who presented with gastroparesis flare and hyperglycemia.  Recently admitted at [facility] for DKA 1/16-1/19.  Afterwards, developed slowly developed epigastric pain and vomiting.     1/28: CT abdomen showed bladder distension, no other acute findings, glucose and AG elevated but pH normal 1/29: Still nauseated, not taking PO     Assessment and Plan: * Gastroparesis due to DM ([place]) Remains in pain, unable to take anything by mouth, nauseated. - Resume IVF - Start scheduled Reglan - Avoid opiates - PRN phenergan and ondansetron for refractory nausea    Acute urinary retention Bladder "markedly" distended on admission CT.  No obstructing mass.    Here, poor UOP and bladder scan showed >999 PVR - Insert foley - Strict I/Os  Hyperosmolar hyperglycemic state (HHS) ([name]) pH normal on admission, started on insulin and glucoses normalized. - Continue IVF - Continue aspart corrections     Chronic orthostatic hypotension - Hold midodrine until able to take PO  Dehydration Minimal rise in creatinine is due to dehydration, not kidney injury.  Chronic kidney disease ruled out. - Continue IV fluids  Chronic systolic CHF (congestive heart failure) ([name]) Appears euvolemic to dehydrated.  Not on home diuretic or GDMT due to hypotension, noncompliance.  Hypokalemia - Replete K  Type 2 diabetes mellitus with neurological complications ([place]) See above.    There is conflicting documentation, but her history (diagnosis in mid-40s, use of orals for several years) and her initial Endo consultation at [place] with Dr. 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    Generalized anxiety disorder No medication treatment  Hyperlipidemia - Hold atorvastatin  Hypothyroidism TSH elevated due to noncompliance. - Continue levothyroxine - Recheck TSH in 2 weeks  Hyponatremia Mild - IV fluids         Subjective: Patient has severe nausea, severe abdominal pain, urinary retention.  No fever, no confusion, no respiratory symptoms noted no chest pain, no swelling.     Physical Exam: BP (!) 110/55   Pulse 83   Temp 98.3 F (36.8 C) (Oral)   Resp 18   Ht 4' 11.5" (1.511 m)   Wt 55.8 kg   LMP 08/11/2012   SpO2 100%   BMI 24.43 kg/m   Thin elderly female, lying in bed, blanket pulled over her head, appears unwell Heart rate normal, respirations normal, no murmurs, no rales or wheezes Abdomen with tenderness diffusely, voluntary guarding noted No rigidity or rebound No ascites Attention normal, affect appropriate, oriented to person, place, and time, face symmetric, speech fluent, moves all extremities with generalized weakness with symmetric strength    Data Reviewed: TSH 35 Glucose normalized Basic metabolic panel shows sodium down to 132, potassium 3, creatinine normal White blood cells up to 13  Family Communication: None present     Disposition: Status is: Observation The patient will require ongoing IV fluids for gastroparesis unable to take p.o., I believe discharge at this time but still risk unnecessary decompensation of her  poorly controlled diabetes        Author: [name], MD 01/07/2023 10:33 AM  For on call review [email or web address].

## 2023-01-07 NOTE — Hospital Course (Addendum)
[name] [name] is a 62 y.o. F with NICM, sCHF E recovered, T2DM, gastroparesis, hypothyroidism, and orthostatic hypotension who presented with gastroparesis flare and hyperglycemia.  Recently admitted at [facility] for DKA 1/16-1/19.  Afterwards, developed slowly developed epigastric pain and vomiting.     1/28: CT abdomen showed bladder distension, no other acute findings, glucose and AG elevated but pH normal

## 2023-01-07 NOTE — Progress Notes (Signed)
Patient admitted from ED to room 1236 via stretcher, patient able to transfer to bed with out difficulties, patient transported by ED nurse, all patient belongings placed it patient belonging bag, patient labels and bags secured and placed in closet, admission history and assessment completed, vitals obtained and fluids started, MRSA screening done and sent with labs, patient assisted up in bed, call light in hand, bed in low locked position with side rails up, wheel locked.

## 2023-01-07 NOTE — Assessment & Plan Note (Signed)
No medication treatment

## 2023-01-07 NOTE — Assessment & Plan Note (Signed)
Appears euvolemic to dehydrated.  Not on home diuretic or GDMT due to hypotension, noncompliance.

## 2023-01-08 DIAGNOSIS — K3184 Gastroparesis: Secondary | ICD-10-CM | POA: Diagnosis not present

## 2023-01-08 DIAGNOSIS — E1143 Type 2 diabetes mellitus with diabetic autonomic (poly)neuropathy: Secondary | ICD-10-CM | POA: Diagnosis not present

## 2023-01-08 LAB — COMPREHENSIVE METABOLIC PANEL
ALT: 13 U/L (ref 0–44)
AST: 16 U/L (ref 15–41)
Albumin: 3.1 g/dL — ABNORMAL LOW (ref 3.5–5.0)
Alkaline Phosphatase: 59 U/L (ref 38–126)
Anion gap: 7 (ref 5–15)
BUN: 8 mg/dL (ref 6–20)
CO2: 23 mmol/L (ref 22–32)
Calcium: 8.5 mg/dL — ABNORMAL LOW (ref 8.9–10.3)
Chloride: 101 mmol/L (ref 98–111)
Creatinine, Ser: 0.51 mg/dL (ref 0.44–1.00)
GFR, Estimated: >60 mL/min (ref >60)
Glucose, Bld: 128 mg/dL — ABNORMAL HIGH (ref 70–99)
Potassium: 4.3 mmol/L (ref 3.5–5.1)
Sodium: 131 mmol/L — ABNORMAL LOW (ref 135–145)
Total Bilirubin: 0.5 mg/dL (ref 0.3–1.2)
Total Protein: 6.2 g/dL — ABNORMAL LOW (ref 6.5–8.1)

## 2023-01-08 LAB — CBC
HCT: 44.4 % (ref 36.0–46.0)
Hemoglobin: 14.4 g/dL (ref 12.0–15.0)
MCH: 28.1 pg (ref 26.0–34.0)
MCHC: 32.4 g/dL (ref 30.0–36.0)
MCV: 86.5 fL (ref 80.0–100.0)
Platelets: 358 10*3/uL (ref 150–400)
RBC: 5.13 MIL/uL — ABNORMAL HIGH (ref 3.87–5.11)
RDW: 15.2 % (ref 11.5–15.5)
WBC: 14.9 10*3/uL — ABNORMAL HIGH (ref 4.0–10.5)
nRBC: 0 % (ref 0.0–0.2)

## 2023-01-08 LAB — GLUCOSE, CAPILLARY
Glucose-Capillary: 125 mg/dL — ABNORMAL HIGH (ref 70–99)
Glucose-Capillary: 136 mg/dL — ABNORMAL HIGH (ref 70–99)
Glucose-Capillary: 141 mg/dL — ABNORMAL HIGH (ref 70–99)
Glucose-Capillary: 155 mg/dL — ABNORMAL HIGH (ref 70–99)

## 2023-01-08 MED ORDER — NITROFURANTOIN MONOHYD MACRO 100 MG PO CAPS: 100.0000 mg | ORAL_CAPSULE | Freq: Two times a day (BID) | ORAL | 0 refills | Status: AC

## 2023-01-08 MED ORDER — OXYBUTYNIN CHLORIDE 5 MG PO TABS: 5.0000 mg | ORAL_TABLET | Freq: Three times a day (TID) | ORAL | 0 refills | Status: DC | PRN

## 2023-01-08 MED ORDER — OXYBUTYNIN CHLORIDE 5 MG PO TABS
5.0000 mg | ORAL_TABLET | Freq: Three times a day (TID) | ORAL | Status: DC | PRN
  Administered 2023-01-08: 5 mg via ORAL
  Filled 2023-01-08: qty 1

## 2023-01-08 NOTE — Discharge Summary (Signed)
Physician Discharge Summary   Patient: [name] [name] MRN: [id number] DOB: 1961/07/29  Admit date:     01/06/2023  Discharge date: 01/08/23  Discharge Physician: [name]   PCP: Rockne Menghini, NP     Recommendations at discharge:  Follow up with PCP Laurin Coder in 1 week for UTI and hyperglycemia Follow up with [facility] in 1 week for urinary retention and foley removal  Chere Phillip Heal: Please follow up urine culture Please check TSH in 2 weeks     Discharge Diagnoses: Principal Problem:   Hyperosmolar hyperglycemic state (HHS) Active Problems:   Acute urinary retention   Acute cystitis   Gastroparesis   Hypothyroidism   Hyperlipidemia   Generalized anxiety disorder   Type 2 diabetes mellitus with neurological complications ([name])   Hyponatremia   Hypokalemia   Chronic systolic CHF (congestive heart failure) ([name])   Dehydration   Chronic orthostatic hypotension      Hospital Course: [name] [name] is a 62 y.o. F with NICM, sCHF E recovered, T2DM, gastroparesis, hypothyroidism, and orthostatic hypotension who presented with gastroparesis flare and hyperglycemia.  Recently admitted at [facility] for DKA 1/16-1/19.  Afterwards, developed slowly developed epigastric pain and vomiting and returned to the hospital.  In the ER, hyperglycemic, pH normal. CT abdomen showed marked bladder distension, no other acute findings      * Hyperosmolar hyperglycemic state (HHS) ([name]) pH normal on admission, started on insulin and glucoses normalized.  Glucoses normalized on subcutaneous insulin, easy to control    Acute urinary retention Bladder "markedly" distended on admission CT.  No obstructing mass.    Foley inserted and put out 2L or more of urine, immediately.  Discussed with Urology, given her marked bladder distension, likely would fail voiding trial today, should go home with foley and follow up in Urology office in 1 week for voiding trial then.      Acute cystitis Given urinary retention, hyperglycemia, leukocytosis and bladder spasm, I suspect she had a cystitis.  Discharged on empiric Macrobid, urine culture pending.     Gastroparesis due to DM [facility]) Admitted with nausea, abdominal pain.  Pain improved, able to tolerate diet.       Chronic orthostatic hypotension Resume midodrine at d/c  Dehydration Minimal rise in creatinine is due to dehydration, not kidney injury.  Chronic kidney disease ruled out.  Treated with IV fluids, Cr stable.  Chronic systolic CHF (congestive heart failure) ([name]) Appears euvolemic to dehydrated.  Not on home diuretic or GDMT due to hypotension, noncompliance.  Hypokalemia Repleted  Type 2 diabetes mellitus with neurological complications ([place]) See above.    There is conflicting documentation, but her history (diagnosis in mid-40s, use of orals for several years) and her initial Endo consultation at [place] with Dr. 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  Generalized anxiety disorder No medication treatment  Hyperlipidemia On atorvastatin  Hypothyroidism TSH elevated due to noncompliance. - Continue levothyroxine - Recheck TSH in 2 weeks            The [place] was reviewed for this patient prior to discharge.   Consultants: None Procedures performed: Foley insertion  Disposition: Home Diet recommendation:  Diabetic  DISCHARGE MEDICATION: Allergies as of 01/08/2023       Reactions   Codeine Hives, Anxiety, Other (See Comments)        Medication List     STOP taking these medications  cephALEXin 500 MG capsule Commonly known as: KEFLEX   metoCLOPramide 5 MG tablet Commonly known as: REGLAN   pantoprazole 40 MG tablet Commonly known as: PROTONIX   sucralfate 1 g tablet Commonly known as: Carafate       TAKE these medications    aspirin EC  81 MG tablet Take 81 mg by mouth daily. Swallow whole.   atorvastatin 80 MG tablet Commonly known as: LIPITOR Take 1 tablet (80 mg total) by mouth daily.   Dexcom G6 Sensor Misc INJECT 1 SENSOR INTO SKIN EVERY 10 DAYS   Dexcom G6 Transmitter Misc USE AS DIRECTED FOR CONTINUOUS GLUCOSE MONITORING. REUSE TRANSMITTER X 90DAYS THEN DISCARD & REPLACE   eletriptan 40 MG tablet Commonly known as: RELPAX Take 1 tablet (40 mg total) by mouth as needed for migraine or headache. May repeat in 2 hours if headache persists or recurs. What changed:  when to take this additional instructions   insulin lispro 100 UNIT/ML KwikPen Commonly known as: HumaLOG KwikPen INJECT INTO SKIN 3 TIMES A DAY AT MEAL TIME ACCORDING TO THE FOLLOWING SLIDING SCALE: 27 UNITS MAX PER DAY DX E11.43 What changed:  how much to take how to take this when to take this additional instructions   levothyroxine 137 MCG tablet Commonly known as: SYNTHROID Take 959 mcg by mouth every Sunday.   midodrine 5 MG tablet Commonly known as: PROAMATINE Take 1 tablet (5 mg total) by mouth 2 (two) times daily with a meal.   nitrofurantoin (macrocrystal-monohydrate) 100 MG capsule Commonly known as: Macrobid Take 1 capsule (100 mg total) by mouth 2 (two) times daily for 7 days.   onetouch ultrasoft lancets Use to check blood sugar daily as needed.  Dx: E11.10   oxybutynin 5 MG tablet Commonly known as: DITROPAN Take 1 tablet (5 mg total) by mouth every 8 (eight) hours as needed for bladder spasms.   Pen Needles 31G X 8 MM Misc Use to inject insulin 4 times a day.  Dx: E11.10   promethazine 25 MG tablet Commonly known as: PHENERGAN Take 1 tablet (25 mg total) by mouth every 8 (eight) hours as needed for nausea or vomiting.   Toujeo SoloStar 300 UNIT/ML Solostar Pen Generic drug: insulin glargine (1 Unit Dial) Inject 6 Units into the skin at bedtime.        Follow-up Information     Rockne Menghini, NP. Schedule  an appointment as soon as possible for a visit in 1 week(s).   Specialty: Family Medicine        [facility]. Schedule an appointment as soon as possible for a visit in 1 week(s).   Contact information: [place] [phone]                Discharge Instructions     Discharge instructions   Complete by: As directed    **IMPORTANT DISCHARGE INSTRUCTIONS**   From Dr. Loleta Books: You were admitted for out of control blood sugar and bladder problems The bladder problem was retention of urine due to uncontrolled blood sugars  You had to have a foley placed. Care for the foley as instructed by nursing Call [facility] today to arrange an appointment within 1 week Let them know you had "urine retention" in the hospital, had to go home with a foley, and your doctor talked to the Urologist from [facility] on call who said to schedule with them as soon as possible in 1  week   IMPORTANT: It may be that there was a little infection in that catheter. I recommend you take the antibiotic nitrofurantoin/Macrobid 100 mg twice daily for a week  If you have pain in the bladder this week, take acetaminophen/Tylenol plus oxybutynin (the bladder spasm medicine) You should use as little as you can, but you may take up to 3 times dialy if needed   Go see your primary doctor in 1 week  Resume your normal home medicines  Be strict with a low carbohydrate diet  Take your insulin daily and control your sugars   Increase activity slowly   Complete by: As directed    No wound care   Complete by: As directed        Discharge Exam: Filed Weights   01/06/23 1647 01/06/23 2100  Weight: 56.7 kg 55.8 kg    General: Pt is alert, awake, not in acute distress Cardiovascular: RRR, nl S1-S2, no murmurs appreciated.   No LE edema.   Respiratory: Normal respiratory rate and rhythm.  CTAB without rales or wheezes. Abdominal:  Abdomen soft and non-tender.  No distension or HSM.   Neuro/Psych: Strength symmetric in upper and lower extremities.  Judgment and insight appear normal.   Condition at discharge: stable  The results of significant diagnostics from this hospitalization (including imaging, microbiology, ancillary and laboratory) are listed below for reference.   Imaging Studies: CT ABDOMEN PELVIS WO CONTRAST  Result Date: 01/06/2023 CLINICAL DATA:  Acute abdominal pain and vomiting. EXAM: CT ABDOMEN AND PELVIS WITHOUT CONTRAST TECHNIQUE: Multidetector CT imaging of the abdomen and pelvis was performed following the standard protocol without IV contrast. RADIATION DOSE REDUCTION: This exam was performed according to the departmental dose-optimization program which includes automated exposure control, adjustment of the mA and/or kV according to patient size and/or use of iterative reconstruction technique. COMPARISON:  05/10/2022 FINDINGS: Lower chest: No acute findings. Hepatobiliary: No mass visualized on this unenhanced exam. Gallbladder is unremarkable. No evidence of biliary ductal dilatation. Pancreas: No mass or inflammatory process visualized on this unenhanced exam. Spleen:  Within normal limits in size. Adrenals/Urinary tract: No evidence of urolithiasis or hydronephrosis. Marked dilatation of the urinary bladder is seen, suspicious for urinary retention. Stomach/Bowel: No evidence of obstruction, inflammatory process, or abnormal fluid collections. Diffuse colonic diverticulosis is seen, without evidence of diverticulitis. Vascular/Lymphatic: No pathologically enlarged lymph nodes identified. No evidence of abdominal aortic aneurysm. Aortic atherosclerotic calcification incidentally noted. Reproductive: Prior hysterectomy noted. Adnexal regions are unremarkable in appearance. Other:  None. Musculoskeletal:  No suspicious bone lesions identified. IMPRESSION: Marked dilatation of urinary bladder, suspicious for  urinary retention. No evidence of urolithiasis, hydronephrosis, or other acute findings. Colonic diverticulosis, without radiographic evidence of diverticulitis. Electronically Signed   By: Marlaine Hind M.D.   On: 01/06/2023 17:48    Microbiology: Results for orders placed or performed during the hospital encounter of 01/06/23  MRSA Next Gen by PCR, Nasal     Status: None   Collection Time: 01/06/23  9:19 PM   Specimen: Nasal Mucosa; Nasal Swab  Result Value Ref Range Status   MRSA by PCR Next Gen NOT DETECTED NOT DETECTED Final    Comment: (NOTE) The GeneXpert MRSA Assay (FDA approved for NASAL specimens only), is one component of a comprehensive MRSA colonization surveillance program. It is not intended to diagnose MRSA infection nor to guide or monitor treatment for MRSA infections. Test performance is not FDA approved in patients less than 56 years old. Performed at [facility]  [place] [address], [place], [place] [address]     Labs: CBC: Recent Labs  Lab 01/06/23 1705 01/07/23 0520 01/08/23 0614  WBC 12.5* 13.2* 14.9*  NEUTROABS 10.9*  --   --   HGB 14.5 13.1 14.4  HCT 43.1 39.3 44.4  MCV 84.3 85.8 86.5  PLT 426* 344 675   Basic Metabolic Panel: Recent Labs  Lab 01/06/23 2113 01/07/23 0047 01/07/23 0520 01/07/23 0919 01/08/23 0614  NA 135 135 132* 133* 131*  K 3.2* 3.2* 3.0* 3.1* 4.3  CL 91* 94* 94* 97* 101  CO2 '30 30 27 26 23  '$ GLUCOSE 280* 183* 192* 169* 128*  BUN '17 13 11 11 8  '$ CREATININE 0.75 0.52 0.48 0.54 0.51  CALCIUM 9.1 8.8* 8.5* 8.6* 8.5*   Liver Function Tests: Recent Labs  Lab 01/06/23 1705 01/08/23 0614  AST 35 16  ALT 22 13  ALKPHOS 81 59  BILITOT 0.6 0.5  PROT 8.3* 6.2*  ALBUMIN 4.2 3.1*   CBG: Recent Labs  Lab 01/07/23 2056 01/08/23 0006 01/08/23 0358 01/08/23 0739 01/08/23 1139  GLUCAP 158* 136* 141* 125* 155*    Discharge time spent: approximately 35 minutes spent on discharge counseling, evaluation of  patient on day of discharge, and coordination of discharge planning with nursing, social work, pharmacy and case management  Signed: [name], MD [facility] 01/08/2023

## 2023-01-09 LAB — URINE CULTURE: Culture: NO GROWTH

## 2023-01-18 DIAGNOSIS — R338 Other retention of urine: Secondary | ICD-10-CM | POA: Diagnosis not present

## 2023-01-22 DIAGNOSIS — G4709 Other insomnia: Secondary | ICD-10-CM | POA: Diagnosis not present

## 2023-01-22 DIAGNOSIS — F419 Anxiety disorder, unspecified: Secondary | ICD-10-CM | POA: Diagnosis not present

## 2023-01-22 DIAGNOSIS — F32A Depression, unspecified: Secondary | ICD-10-CM | POA: Diagnosis not present

## 2023-01-22 DIAGNOSIS — I10 Essential (primary) hypertension: Secondary | ICD-10-CM | POA: Diagnosis not present

## 2023-01-22 DIAGNOSIS — I4891 Unspecified atrial fibrillation: Secondary | ICD-10-CM | POA: Diagnosis not present

## 2023-01-22 DIAGNOSIS — R339 Retention of urine, unspecified: Secondary | ICD-10-CM | POA: Diagnosis not present

## 2023-01-29 DIAGNOSIS — I10 Essential (primary) hypertension: Secondary | ICD-10-CM | POA: Diagnosis not present

## 2023-01-29 DIAGNOSIS — I209 Angina pectoris, unspecified: Secondary | ICD-10-CM | POA: Diagnosis not present

## 2023-01-29 DIAGNOSIS — I2089 Other forms of angina pectoris: Secondary | ICD-10-CM | POA: Diagnosis not present

## 2023-01-29 DIAGNOSIS — M79604 Pain in right leg: Secondary | ICD-10-CM | POA: Diagnosis not present

## 2023-01-29 DIAGNOSIS — M79605 Pain in left leg: Secondary | ICD-10-CM | POA: Diagnosis not present

## 2023-02-01 DIAGNOSIS — E1159 Type 2 diabetes mellitus with other circulatory complications: Secondary | ICD-10-CM | POA: Diagnosis not present

## 2023-02-01 DIAGNOSIS — E1169 Type 2 diabetes mellitus with other specified complication: Secondary | ICD-10-CM | POA: Diagnosis not present

## 2023-02-01 DIAGNOSIS — E1140 Type 2 diabetes mellitus with diabetic neuropathy, unspecified: Secondary | ICD-10-CM | POA: Diagnosis not present

## 2023-02-01 DIAGNOSIS — E039 Hypothyroidism, unspecified: Secondary | ICD-10-CM | POA: Diagnosis not present

## 2023-02-01 DIAGNOSIS — Z7982 Long term (current) use of aspirin: Secondary | ICD-10-CM | POA: Diagnosis not present

## 2023-02-01 DIAGNOSIS — K3184 Gastroparesis: Secondary | ICD-10-CM | POA: Diagnosis not present

## 2023-02-01 DIAGNOSIS — Z79890 Hormone replacement therapy (postmenopausal): Secondary | ICD-10-CM | POA: Diagnosis not present

## 2023-02-01 DIAGNOSIS — I420 Dilated cardiomyopathy: Secondary | ICD-10-CM | POA: Diagnosis not present

## 2023-02-01 DIAGNOSIS — I129 Hypertensive chronic kidney disease with stage 1 through stage 4 chronic kidney disease, or unspecified chronic kidney disease: Secondary | ICD-10-CM | POA: Diagnosis not present

## 2023-02-01 DIAGNOSIS — E1122 Type 2 diabetes mellitus with diabetic chronic kidney disease: Secondary | ICD-10-CM | POA: Diagnosis not present

## 2023-02-01 DIAGNOSIS — R072 Precordial pain: Secondary | ICD-10-CM | POA: Diagnosis not present

## 2023-02-01 DIAGNOSIS — E118 Type 2 diabetes mellitus with unspecified complications: Secondary | ICD-10-CM | POA: Diagnosis not present

## 2023-02-01 DIAGNOSIS — E1143 Type 2 diabetes mellitus with diabetic autonomic (poly)neuropathy: Secondary | ICD-10-CM | POA: Diagnosis not present

## 2023-02-01 DIAGNOSIS — Z794 Long term (current) use of insulin: Secondary | ICD-10-CM | POA: Diagnosis not present

## 2023-02-01 DIAGNOSIS — E782 Mixed hyperlipidemia: Secondary | ICD-10-CM | POA: Diagnosis not present

## 2023-02-01 DIAGNOSIS — I2510 Atherosclerotic heart disease of native coronary artery without angina pectoris: Secondary | ICD-10-CM | POA: Diagnosis not present

## 2023-02-01 DIAGNOSIS — E876 Hypokalemia: Secondary | ICD-10-CM | POA: Diagnosis not present

## 2023-02-01 DIAGNOSIS — N1831 Chronic kidney disease, stage 3a: Secondary | ICD-10-CM | POA: Diagnosis not present

## 2023-02-02 ENCOUNTER — Inpatient Hospital Stay (HOSPITAL_COMMUNITY)
Admission: EM | Admit: 2023-02-02 | Discharge: 2023-02-06 | DRG: 637 | Disposition: A | Discharge status: Home / Self Care | Payer: BC Managed Care – PPO | Attending: Internal Medicine | Admitting: Internal Medicine

## 2023-02-02 ENCOUNTER — Other Ambulatory Visit: Payer: Self-pay

## 2023-02-02 DIAGNOSIS — Z833 Family history of diabetes mellitus: Secondary | ICD-10-CM | POA: Diagnosis not present

## 2023-02-02 DIAGNOSIS — R739 Hyperglycemia, unspecified: Secondary | ICD-10-CM | POA: Diagnosis not present

## 2023-02-02 DIAGNOSIS — Z79899 Other long term (current) drug therapy: Secondary | ICD-10-CM

## 2023-02-02 DIAGNOSIS — I5032 Chronic diastolic (congestive) heart failure: Secondary | ICD-10-CM | POA: Diagnosis present

## 2023-02-02 DIAGNOSIS — E1143 Type 2 diabetes mellitus with diabetic autonomic (poly)neuropathy: Secondary | ICD-10-CM | POA: Diagnosis present

## 2023-02-02 DIAGNOSIS — I428 Other cardiomyopathies: Secondary | ICD-10-CM | POA: Diagnosis not present

## 2023-02-02 DIAGNOSIS — K3189 Other diseases of stomach and duodenum: Secondary | ICD-10-CM | POA: Diagnosis present

## 2023-02-02 DIAGNOSIS — K311 Adult hypertrophic pyloric stenosis: Secondary | ICD-10-CM | POA: Diagnosis not present

## 2023-02-02 DIAGNOSIS — R809 Proteinuria, unspecified: Secondary | ICD-10-CM | POA: Diagnosis present

## 2023-02-02 DIAGNOSIS — K5730 Diverticulosis of large intestine without perforation or abscess without bleeding: Secondary | ICD-10-CM | POA: Diagnosis not present

## 2023-02-02 DIAGNOSIS — R339 Retention of urine, unspecified: Secondary | ICD-10-CM | POA: Diagnosis present

## 2023-02-02 DIAGNOSIS — Z885 Allergy status to narcotic agent status: Secondary | ICD-10-CM | POA: Diagnosis not present

## 2023-02-02 DIAGNOSIS — Z794 Long term (current) use of insulin: Secondary | ICD-10-CM

## 2023-02-02 DIAGNOSIS — R933 Abnormal findings on diagnostic imaging of other parts of digestive tract: Secondary | ICD-10-CM | POA: Diagnosis not present

## 2023-02-02 DIAGNOSIS — E039 Hypothyroidism, unspecified: Secondary | ICD-10-CM | POA: Diagnosis not present

## 2023-02-02 DIAGNOSIS — Z8249 Family history of ischemic heart disease and other diseases of the circulatory system: Secondary | ICD-10-CM

## 2023-02-02 DIAGNOSIS — R1111 Vomiting without nausea: Secondary | ICD-10-CM | POA: Diagnosis not present

## 2023-02-02 DIAGNOSIS — K6389 Other specified diseases of intestine: Secondary | ICD-10-CM | POA: Diagnosis not present

## 2023-02-02 DIAGNOSIS — K2950 Unspecified chronic gastritis without bleeding: Secondary | ICD-10-CM | POA: Diagnosis not present

## 2023-02-02 DIAGNOSIS — Z87891 Personal history of nicotine dependence: Secondary | ICD-10-CM

## 2023-02-02 DIAGNOSIS — K2100 Gastro-esophageal reflux disease with esophagitis, without bleeding: Secondary | ICD-10-CM | POA: Diagnosis not present

## 2023-02-02 DIAGNOSIS — Z7982 Long term (current) use of aspirin: Secondary | ICD-10-CM

## 2023-02-02 DIAGNOSIS — R000 Tachycardia, unspecified: Secondary | ICD-10-CM | POA: Diagnosis not present

## 2023-02-02 DIAGNOSIS — R112 Nausea with vomiting, unspecified: Secondary | ICD-10-CM | POA: Diagnosis not present

## 2023-02-02 DIAGNOSIS — Z820 Family history of epilepsy and other diseases of the nervous system: Secondary | ICD-10-CM

## 2023-02-02 DIAGNOSIS — Z1152 Encounter for screening for COVID-19: Secondary | ICD-10-CM

## 2023-02-02 DIAGNOSIS — I509 Heart failure, unspecified: Secondary | ICD-10-CM | POA: Diagnosis not present

## 2023-02-02 DIAGNOSIS — I951 Orthostatic hypotension: Secondary | ICD-10-CM | POA: Diagnosis present

## 2023-02-02 DIAGNOSIS — Z560 Unemployment, unspecified: Secondary | ICD-10-CM

## 2023-02-02 DIAGNOSIS — K219 Gastro-esophageal reflux disease without esophagitis: Secondary | ICD-10-CM | POA: Diagnosis present

## 2023-02-02 DIAGNOSIS — Z90710 Acquired absence of both cervix and uterus: Secondary | ICD-10-CM

## 2023-02-02 DIAGNOSIS — I110 Hypertensive heart disease with heart failure: Secondary | ICD-10-CM | POA: Diagnosis not present

## 2023-02-02 DIAGNOSIS — K2101 Gastro-esophageal reflux disease with esophagitis, with bleeding: Secondary | ICD-10-CM | POA: Diagnosis not present

## 2023-02-02 DIAGNOSIS — E785 Hyperlipidemia, unspecified: Secondary | ICD-10-CM | POA: Diagnosis not present

## 2023-02-02 DIAGNOSIS — N179 Acute kidney failure, unspecified: Secondary | ICD-10-CM | POA: Diagnosis not present

## 2023-02-02 DIAGNOSIS — R109 Unspecified abdominal pain: Secondary | ICD-10-CM | POA: Diagnosis not present

## 2023-02-02 DIAGNOSIS — K3184 Gastroparesis: Secondary | ICD-10-CM | POA: Diagnosis present

## 2023-02-02 DIAGNOSIS — N3289 Other specified disorders of bladder: Secondary | ICD-10-CM | POA: Diagnosis present

## 2023-02-02 DIAGNOSIS — I10 Essential (primary) hypertension: Secondary | ICD-10-CM | POA: Diagnosis not present

## 2023-02-02 DIAGNOSIS — K2900 Acute gastritis without bleeding: Secondary | ICD-10-CM | POA: Diagnosis not present

## 2023-02-02 DIAGNOSIS — Z79890 Hormone replacement therapy (postmenopausal): Secondary | ICD-10-CM

## 2023-02-02 DIAGNOSIS — R338 Other retention of urine: Secondary | ICD-10-CM | POA: Diagnosis present

## 2023-02-02 DIAGNOSIS — E1110 Type 2 diabetes mellitus with ketoacidosis without coma: Principal | ICD-10-CM

## 2023-02-02 DIAGNOSIS — Z91148 Patient's other noncompliance with medication regimen for other reason: Secondary | ICD-10-CM

## 2023-02-02 LAB — BLOOD GAS, VENOUS
Acid-Base Excess: 1.4 mmol/L (ref 0.0–2.0)
Bicarbonate: 24.9 mmol/L (ref 20.0–28.0)
O2 Saturation: 98.5 %
Patient temperature: 37
pCO2, Ven: 35 mmHg — ABNORMAL LOW (ref 44–60)
pH, Ven: 7.46 — ABNORMAL HIGH (ref 7.25–7.43)
pO2, Ven: 92 mmHg — ABNORMAL HIGH (ref 32–45)

## 2023-02-02 LAB — CBC WITH DIFFERENTIAL/PLATELET
Abs Immature Granulocytes: 0.12 10*3/uL — ABNORMAL HIGH (ref 0.00–0.07)
Basophils Absolute: 0.1 10*3/uL (ref 0.0–0.1)
Basophils Relative: 0 %
Eosinophils Absolute: 0 10*3/uL (ref 0.0–0.5)
Eosinophils Relative: 0 %
HCT: 47 % — ABNORMAL HIGH (ref 36.0–46.0)
Hemoglobin: 15.4 g/dL — ABNORMAL HIGH (ref 12.0–15.0)
Immature Granulocytes: 1 %
Lymphocytes Relative: 13 %
Lymphs Abs: 1.8 10*3/uL (ref 0.7–4.0)
MCH: 28.3 pg (ref 26.0–34.0)
MCHC: 32.8 g/dL (ref 30.0–36.0)
MCV: 86.4 fL (ref 80.0–100.0)
Monocytes Absolute: 0.6 10*3/uL (ref 0.1–1.0)
Monocytes Relative: 4 %
Neutro Abs: 11.2 10*3/uL — ABNORMAL HIGH (ref 1.7–7.7)
Neutrophils Relative %: 82 %
Platelets: 408 10*3/uL — ABNORMAL HIGH (ref 150–400)
RBC: 5.44 MIL/uL — ABNORMAL HIGH (ref 3.87–5.11)
RDW: 15.4 % (ref 11.5–15.5)
WBC: 13.9 10*3/uL — ABNORMAL HIGH (ref 4.0–10.5)
nRBC: 0 % (ref 0.0–0.2)

## 2023-02-02 LAB — COMPREHENSIVE METABOLIC PANEL
ALT: 15 U/L (ref 0–44)
AST: 21 U/L (ref 15–41)
Albumin: 4.5 g/dL (ref 3.5–5.0)
Alkaline Phosphatase: 77 U/L (ref 38–126)
Anion gap: 20 — ABNORMAL HIGH (ref 5–15)
BUN: 21 mg/dL — ABNORMAL HIGH (ref 6–20)
CO2: 23 mmol/L (ref 22–32)
Calcium: 9.2 mg/dL (ref 8.9–10.3)
Chloride: 95 mmol/L — ABNORMAL LOW (ref 98–111)
Creatinine, Ser: 0.82 mg/dL (ref 0.44–1.00)
GFR, Estimated: >60 mL/min (ref >60)
Glucose, Bld: 441 mg/dL — ABNORMAL HIGH (ref 70–99)
Potassium: 3.4 mmol/L — ABNORMAL LOW (ref 3.5–5.1)
Sodium: 138 mmol/L (ref 135–145)
Total Bilirubin: 0.9 mg/dL (ref 0.3–1.2)
Total Protein: 8.2 g/dL — ABNORMAL HIGH (ref 6.5–8.1)

## 2023-02-02 LAB — RESP PANEL BY RT-PCR (RSV, FLU A&B, COVID)  RVPGX2
Influenza A by PCR: NEGATIVE
Influenza B by PCR: NEGATIVE
Resp Syncytial Virus by PCR: NEGATIVE
SARS Coronavirus 2 by RT PCR: NEGATIVE

## 2023-02-02 LAB — URINALYSIS, ROUTINE W REFLEX MICROSCOPIC
Bilirubin Urine: NEGATIVE
Glucose, UA: >=500 mg/dL — AB
Hgb urine dipstick: NEGATIVE
Ketones, ur: 20 mg/dL — AB
Leukocytes,Ua: NEGATIVE
Nitrite: NEGATIVE
Protein, ur: 30 mg/dL — AB
Specific Gravity, Urine: 1.027 (ref 1.005–1.030)
pH: 5 (ref 5.0–8.0)

## 2023-02-02 LAB — BASIC METABOLIC PANEL
Anion gap: 8 (ref 5–15)
Anion gap: 12 (ref 5–15)
BUN: 15 mg/dL (ref 6–20)
BUN: 14 mg/dL (ref 6–20)
CO2: 24 mmol/L (ref 22–32)
CO2: 25 mmol/L (ref 22–32)
Calcium: 8 mg/dL — ABNORMAL LOW (ref 8.9–10.3)
Calcium: 8.2 mg/dL — ABNORMAL LOW (ref 8.9–10.3)
Chloride: 104 mmol/L (ref 98–111)
Chloride: 101 mmol/L (ref 98–111)
Creatinine, Ser: 0.59 mg/dL (ref 0.44–1.00)
Creatinine, Ser: 0.6 mg/dL (ref 0.44–1.00)
GFR, Estimated: >60 mL/min (ref >60)
GFR, Estimated: >60 mL/min (ref >60)
Glucose, Bld: 222 mg/dL — ABNORMAL HIGH (ref 70–99)
Glucose, Bld: 204 mg/dL — ABNORMAL HIGH (ref 70–99)
Potassium: 3.3 mmol/L — ABNORMAL LOW (ref 3.5–5.1)
Potassium: 3.3 mmol/L — ABNORMAL LOW (ref 3.5–5.1)
Sodium: 136 mmol/L (ref 135–145)
Sodium: 138 mmol/L (ref 135–145)

## 2023-02-02 LAB — CBC
HCT: 42.7 % (ref 36.0–46.0)
Hemoglobin: 13.9 g/dL (ref 12.0–15.0)
MCH: 28.5 pg (ref 26.0–34.0)
MCHC: 32.6 g/dL (ref 30.0–36.0)
MCV: 87.7 fL (ref 80.0–100.0)
Platelets: 352 10*3/uL (ref 150–400)
RBC: 4.87 MIL/uL (ref 3.87–5.11)
RDW: 15.3 % (ref 11.5–15.5)
WBC: 13.3 10*3/uL — ABNORMAL HIGH (ref 4.0–10.5)
nRBC: 0 % (ref 0.0–0.2)

## 2023-02-02 LAB — TSH: TSH: 24.206 u[IU]/mL — ABNORMAL HIGH (ref 0.350–4.500)

## 2023-02-02 LAB — CBG MONITORING, ED
Glucose-Capillary: 162 mg/dL — ABNORMAL HIGH (ref 70–99)
Glucose-Capillary: 162 mg/dL — ABNORMAL HIGH (ref 70–99)
Glucose-Capillary: 196 mg/dL — ABNORMAL HIGH (ref 70–99)
Glucose-Capillary: 221 mg/dL — ABNORMAL HIGH (ref 70–99)
Glucose-Capillary: 287 mg/dL — ABNORMAL HIGH (ref 70–99)
Glucose-Capillary: 375 mg/dL — ABNORMAL HIGH (ref 70–99)
Glucose-Capillary: 457 mg/dL — ABNORMAL HIGH (ref 70–99)

## 2023-02-02 LAB — BETA-HYDROXYBUTYRIC ACID
Beta-Hydroxybutyric Acid: 4.16 mmol/L — ABNORMAL HIGH (ref 0.05–0.27)
Beta-Hydroxybutyric Acid: 3.68 mmol/L — ABNORMAL HIGH (ref 0.05–0.27)

## 2023-02-02 LAB — LACTIC ACID, PLASMA
Lactic Acid, Venous: 2.5 mmol/L (ref 0.5–1.9)
Lactic Acid, Venous: 1.7 mmol/L (ref 0.5–1.9)

## 2023-02-02 LAB — LIPASE, BLOOD: Lipase: 40 U/L (ref 11–51)

## 2023-02-02 MED ORDER — KETOROLAC TROMETHAMINE 30 MG/ML IJ SOLN
30.0000 mg | Freq: Once | INTRAMUSCULAR | Status: AC
  Administered 2023-02-02: 30 mg via INTRAVENOUS
  Filled 2023-02-02: qty 1

## 2023-02-02 MED ORDER — LACTATED RINGERS IV SOLN: INTRAVENOUS | Status: DC

## 2023-02-02 MED ORDER — INSULIN REGULAR(HUMAN) IN NACL 100-0.9 UT/100ML-% IV SOLN: INTRAVENOUS | Status: DC

## 2023-02-02 MED ORDER — DEXTROSE 50 % IV SOLN: 0.0000 mL | INTRAVENOUS | Status: DC | PRN

## 2023-02-02 MED ORDER — LACTATED RINGERS IV BOLUS: 20.0000 mL/kg | Freq: Once | INTRAVENOUS | Status: DC

## 2023-02-02 MED ORDER — LACTATED RINGERS IV BOLUS
1000.0000 mL | Freq: Once | INTRAVENOUS | Status: AC
  Administered 2023-02-02: 1000 mL via INTRAVENOUS

## 2023-02-02 MED ORDER — METOPROLOL TARTRATE 5 MG/5ML IV SOLN
5.0000 mg | Freq: Four times a day (QID) | INTRAVENOUS | Status: DC | PRN
  Administered 2023-02-06: 5 mg via INTRAVENOUS
  Filled 2023-02-02: qty 5

## 2023-02-02 MED ORDER — PROCHLORPERAZINE EDISYLATE 10 MG/2ML IJ SOLN
10.0000 mg | Freq: Four times a day (QID) | INTRAMUSCULAR | Status: AC | PRN
  Administered 2023-02-02 – 2023-02-03 (×2): 10 mg via INTRAVENOUS
  Filled 2023-02-02 (×2): qty 2

## 2023-02-02 MED ORDER — DEXTROSE IN LACTATED RINGERS 5 % IV SOLN: INTRAVENOUS | Status: DC

## 2023-02-02 MED ORDER — ONDANSETRON HCL 4 MG/2ML IJ SOLN
4.0000 mg | Freq: Four times a day (QID) | INTRAMUSCULAR | Status: DC | PRN
  Administered 2023-02-02 – 2023-02-05 (×3): 4 mg via INTRAVENOUS
  Filled 2023-02-02 (×4): qty 2

## 2023-02-02 MED ORDER — METOCLOPRAMIDE HCL 5 MG/ML IJ SOLN
10.0000 mg | Freq: Once | INTRAMUSCULAR | Status: AC
  Administered 2023-02-02: 10 mg via INTRAVENOUS
  Filled 2023-02-02: qty 2

## 2023-02-02 MED ORDER — ONDANSETRON HCL 4 MG/2ML IJ SOLN
4.0000 mg | Freq: Once | INTRAMUSCULAR | Status: AC
  Administered 2023-02-02: 4 mg via INTRAVENOUS
  Filled 2023-02-02: qty 2

## 2023-02-02 MED ORDER — POTASSIUM CHLORIDE 10 MEQ/100ML IV SOLN: 10.0000 meq | INTRAVENOUS | Status: DC

## 2023-02-02 MED ORDER — POTASSIUM CHLORIDE 10 MEQ/100ML IV SOLN
10.0000 meq | INTRAVENOUS | Status: AC
  Administered 2023-02-02 (×4): 10 meq via INTRAVENOUS
  Filled 2023-02-02 (×4): qty 100

## 2023-02-02 MED ORDER — INSULIN REGULAR(HUMAN) IN NACL 100-0.9 UT/100ML-% IV SOLN
INTRAVENOUS | Status: DC
  Administered 2023-02-02: 11 [IU]/h via INTRAVENOUS
  Filled 2023-02-02: qty 100

## 2023-02-02 MED ORDER — ENOXAPARIN SODIUM 40 MG/0.4ML IJ SOSY
40.0000 mg | PREFILLED_SYRINGE | INTRAMUSCULAR | Status: DC
  Administered 2023-02-02 – 2023-02-03 (×2): 40 mg via SUBCUTANEOUS
  Filled 2023-02-02 (×2): qty 0.4

## 2023-02-02 NOTE — H&P (Signed)
History and Physical    [name] [name] [id number] DOB: 02/13/1961 DOA: 02/02/2023  PCP: Rockne Menghini, NP Patient coming from: home  Chief Complaint: Nausea vomiting and diarrhea  HPI: [name] [name] is a 62 y.o. female with medical history significant of uncontrolled type 2 diabetes complicated by multiple hospital admissions.  Patient has had 2 admissions in January 2024 and 2 ER visits this year so far.  She had a hospital admission in [place] in January 2024. Patient had a heart cath at [facility] yesterday she was told not to take her insulin so she did not take it yesterday however she took her insulin today.  No intervention was recommended after the cath.  She denied chest pain shortness of breath fever chills cough.  She complains of nausea vomiting and diarrhea multiple times.  She denies abdominal pain.  The cath was done to the right groin where she really has no complaints of pain there.  Patient had the last hospital admission on January 08, 2023 When she was diagnosed with DKA.  At that time workup revealed a very elevated TSH of 50.  She was told to repeat her TSH in 2 weeks but there is no documentation of that in the EMR.  She reports taking all her medications as prescribed. She denies dysuria frequency. ED Course: CT of the abdomen and pelvis concerning for bladder distention and urinary retention. Blood pressure 166/104 pulse is 116 respirations 16 she is afebrile saturating 99% on room air. She was started on insulin drip. Her sodium was 138 potassium was 3.4 BUN was 21 creatinine 0.82 she had anion gap of 20. White count 13.9.  Hemoglobin 15.4.  Beta hydroxybutyrate level is 4.16.  Last hemoglobin A1c was 13 in January 2024. UA shows ketones and proteinuria. LA 2.5 Review of Systems: As per HPI otherwise all other systems reviewed and are negative  Ambulatory Status: Ambulatory at baseline lives at home.  Past Medical History:  Diagnosis Date   Allergy    seasonal    Anxiety    B12 deficiency    Chest pain 04/05/2017   CHF (congestive heart failure) ([place])    Diabetes mellitus type 1, uncontrolled    "dr. 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) 10/29/2013   Leg cramping    "@ night" (09/18/2013)   Migraine    "once q couple months" (11/26/2018)   Osteoporosis    Peripheral neuropathy    Umbilical hernia    unrepaired (09/18/2013)    Past Surgical History:  Procedure Laterality Date   BIOPSY  06/03/2019   Procedure: BIOPSY;  Surgeon: Jackquline Denmark, MD;  Location: [facility];  Service: Endoscopy;;   BIOPSY  08/17/2020   Procedure: BIOPSY;  Surgeon: Thornton Park, MD;  Location: [facility];  Service: Gastroenterology;;   [place]   ENTEROSCOPY N/A 06/03/2019   Procedure: ENTEROSCOPY;  Surgeon: Jackquline Denmark, MD;  Location: [facility];  Service: Endoscopy;  Laterality: N/A;   ESOPHAGOGASTRODUODENOSCOPY (EGD) WITH PROPOFOL N/A 08/17/2020   Procedure: ESOPHAGOGASTRODUODENOSCOPY (EGD) WITH PROPOFOL;  Surgeon: Thornton Park, MD;  Location: [facility];  Service: Gastroenterology;  Laterality: N/A;   LAPAROSCOPIC ASSISTED VAGINAL HYSTERECTOMY  09/23/2012   Procedure: LAPAROSCOPIC  ASSISTED VAGINAL HYSTERECTOMY;  Surgeon: Linda Hedges, DO;  Location: [facility];  Service: Gynecology;  Laterality: N/A;  pull Dr Gregor Hams instrument   LEFT HEART CATH AND CORONARY ANGIOGRAPHY N/A 02/11/2017   Procedure: Left Heart Cath and Coronary Angiography;  Surgeon: Lorretta Harp, MD;  Location: [facility];  Service: Cardiovascular;  Laterality: N/A;   TUBAL LIGATION Bilateral 1992    Social History   Socioeconomic History   Marital status: Married    Spouse name: Not on file    Number of children: 2   Years of education: Not on file   Highest education level: Not on file  Occupational History   Occupation: UNEMPLOYED    Employer: UNEMPLOYED    Comment: homemaker  Tobacco Use   Smoking status: Former    Packs/day: 0.12    Years: 10.00    Total pack years: 1.20    Types: Cigarettes    Quit date: 12/10/1990    Years since quitting: 32.1   Smokeless tobacco: Never  Vaping Use   Vaping Use: Never used  Substance and Sexual Activity   Alcohol use: Never    Alcohol/week: 0.0 standard drinks of alcohol   Drug use: Yes    Types: Marijuana    Comment: last use 2 days ago   Sexual activity: Not Currently  Other Topics Concern   Not on file  Social History Narrative   Regular exercise- no    Diet- lots of mountain dew, limits fast foods    Social Determinants of Health   Financial Resource Strain: Not on file  Food Insecurity: No Food Insecurity (01/06/2023)   Hunger Vital Sign    Worried About Running Out of Food in the Last Year: Never true    Ran Out of Food in the Last Year: Never true  Transportation Needs: No Transportation Needs (01/06/2023)   PRAPARE - Hydrologist (Medical): No    Lack of Transportation (Non-Medical): No  Physical Activity: Not on file  Stress: Not on file  Social Connections: Not on file  Intimate Partner Violence: Not At Risk (01/06/2023)   Humiliation, Afraid, Rape, and Kick questionnaire    Fear of Current or Ex-Partner: No    Emotionally Abused: No    Physically Abused: No    Sexually Abused: No    Allergies  Allergen Reactions   Codeine Hives, Anxiety and Other (See Comments)    Family History  Problem Relation Age of Onset   Diabetes Other    Heart disease Other    Heart failure Mother 32       Her heart skipped a beat and stopped - per pt   Diabetes Maternal Grandmother    Alzheimer's disease Maternal Grandmother    Coronary artery disease Maternal Grandmother    Heart attack  Maternal Grandfather    Heart failure Brother    Diabetes Brother    Colon polyps Neg Hx    Esophageal cancer Neg Hx    Liver cancer Neg Hx    Pancreatic cancer Neg Hx    Stomach cancer Neg Hx    Crohn's disease Neg Hx    Rectal cancer Neg Hx       Prior to Admission medications   Medication Sig Start Date End Date Taking? Authorizing Provider  aspirin EC 81 MG tablet Take 81 mg by mouth daily. Swallow whole.    [provider]  atorvastatin (LIPITOR) 80 MG tablet Take 1 tablet (80  mg total) by mouth daily. 01/12/22   Bedsole, Amy E, MD  Continuous Blood Gluc Sensor (DEXCOM G6 SENSOR) MISC INJECT 1 SENSOR INTO SKIN EVERY 10 DAYS 07/18/22   Bedsole, Amy E, MD  Continuous Blood Gluc Transmit (DEXCOM G6 TRANSMITTER) MISC USE AS DIRECTED FOR CONTINUOUS GLUCOSE MONITORING. REUSE TRANSMITTER X 90DAYS THEN DISCARD & REPLACE 07/05/21   Bedsole, Amy E, MD  eletriptan (RELPAX) 40 MG tablet Take 1 tablet (40 mg total) by mouth as needed for migraine or headache. May repeat in 2 hours if headache persists or recurs. Patient taking differently: Take 40 mg by mouth every 2 (two) hours as needed for migraine or headache. 08/14/22   Bedsole, Amy E, MD  insulin lispro (HUMALOG KWIKPEN) 100 UNIT/ML KwikPen INJECT INTO SKIN 3 TIMES A DAY AT MEAL TIME ACCORDING TO THE FOLLOWING SLIDING SCALE: 27 UNITS MAX PER DAY DX E11.43 Patient taking differently: Inject 1-4 Units into the skin with breakfast, with lunch, and with evening meal. Per sliding scale. 02/26/22   Bedsole, Amy E, MD  Insulin Pen Needle (PEN NEEDLES) 31G X 8 MM MISC Use to inject insulin 4 times a day.  Dx: E11.10 07/21/21   Jinny Sanders, MD  Lancets (ONETOUCH ULTRASOFT) lancets Use to check blood sugar daily as needed.  Dx: E11.10 12/23/17   Jinny Sanders, MD  levothyroxine (SYNTHROID) 137 MCG tablet Take 959 mcg by mouth every Sunday. 03/19/22   [provider]  midodrine (PROAMATINE) 5 MG tablet Take 1 tablet (5 mg total) by mouth 2  (two) times daily with a meal. 03/02/22   Minus Breeding, MD  oxybutynin (DITROPAN) 5 MG tablet Take 1 tablet (5 mg total) by mouth every 8 (eight) hours as needed for bladder spasms. 01/08/23   Danford, Suann Larry, MD  promethazine (PHENERGAN) 25 MG tablet Take 1 tablet (25 mg total) by mouth every 8 (eight) hours as needed for nausea or vomiting. 01/05/22   Bedsole, Amy E, MD  TOUJEO SOLOSTAR 300 UNIT/ML Solostar Pen Inject 6 Units into the skin at bedtime. 03/29/22   Regalado, Cassie Freer, MD    Physical Exam: Vitals:   02/02/23 1449 02/02/23 1600  BP: (!) 174/97 (!) 166/104  Pulse: (!) 120 (!) 122  Resp: 14 16  SpO2: 100% 99%     General:  Appears in the stress appears chronically ill looking Eyes:  PERRL, EOMI, normal lids, iris ENT:  grossly normal hearing, lips & tongue, oral mucosa dry Neck:  no LAD, masses or thyromegaly Cardiovascular:  RRR, no m/r/g. No LE edema.  Respiratory:  CTA bilaterally, no w/r/r. Normal respiratory effort. Abdomen:  soft, distended right groin Angio-Seal in place skin:  no rash or induration seen on limited exam Musculoskeletal:  grossly normal tone BUE/BLE, good ROM, no bony abnormality Psychiatric:  grossly normal mood and affect, speech fluent and appropriate, AOx3 Neurologic:  CN 2-12 grossly intact, moves all extremities in coordinated fashion, sensation intact  Labs on Admission: I have personally reviewed following labs and imaging studies  CBC: Recent Labs  Lab 02/02/23 1450  WBC 13.9*  NEUTROABS 11.2*  HGB 15.4*  HCT 47.0*  MCV 86.4  PLT 123XX123*   Basic Metabolic Panel: Recent Labs  Lab 02/02/23 1450  NA 138  K 3.4*  CL 95*  CO2 23  GLUCOSE 441*  BUN 21*  CREATININE 0.82  CALCIUM 9.2   GFR: CrCl cannot be calculated (Unknown ideal weight.). Liver Function Tests: Recent Labs  Lab 02/02/23 1450  AST 21  ALT 15  ALKPHOS 77  BILITOT 0.9  PROT 8.2*  ALBUMIN 4.5   Recent Labs  Lab 02/02/23 1450  LIPASE 40   No  results for input(s): "AMMONIA" in the last 168 hours. Coagulation Profile: No results for input(s): "INR", "PROTIME" in the last 168 hours. Cardiac Enzymes: No results for input(s): "CKTOTAL", "CKMB", "CKMBINDEX", "TROPONINI" in the last 168 hours. BNP (last 3 results) No results for input(s): "PROBNP" in the last 8760 hours. HbA1C: No results for input(s): "HGBA1C" in the last 72 hours. CBG: Recent Labs  Lab 02/02/23 1438  GLUCAP 457*   Lipid Profile: No results for input(s): "CHOL", "HDL", "LDLCALC", "TRIG", "CHOLHDL", "LDLDIRECT" in the last 72 hours. Thyroid Function Tests: No results for input(s): "TSH", "T4TOTAL", "FREET4", "T3FREE", "THYROIDAB" in the last 72 hours. Anemia Panel: No results for input(s): "VITAMINB12", "FOLATE", "FERRITIN", "TIBC", "IRON", "RETICCTPCT" in the last 72 hours. Urine analysis:    Component Value Date/Time   COLORURINE STRAW (A) 01/06/2023 1832   APPEARANCEUR CLEAR 01/06/2023 1832   LABSPEC 1.026 01/06/2023 1832   PHURINE 7.0 01/06/2023 1832   GLUCOSEU >=500 (A) 01/06/2023 1832   HGBUR NEGATIVE 01/06/2023 1832   BILIRUBINUR NEGATIVE 01/06/2023 1832   BILIRUBINUR negative 03/09/2021 1123   KETONESUR 20 (A) 01/06/2023 1832   PROTEINUR 30 (A) 01/06/2023 1832   UROBILINOGEN 0.2 03/09/2021 1123   UROBILINOGEN 0.2 10/28/2013 1723   NITRITE NEGATIVE 01/06/2023 1832   LEUKOCYTESUR NEGATIVE 01/06/2023 1832    Creatinine Clearance: CrCl cannot be calculated (Unknown ideal weight.).  Sepsis Labs: '@LABRCNTIP'$ (procalcitonin:4,lacticidven:4) ) Recent Results (from the past 240 hour(s))  Resp panel by RT-PCR (RSV, Flu A&B, Covid) Anterior Nasal Swab     Status: None   Collection Time: 02/02/23  2:41 PM   Specimen: Anterior Nasal Swab  Result Value Ref Range Status   SARS Coronavirus 2 by RT PCR NEGATIVE NEGATIVE Final    Comment: (NOTE) SARS-CoV-2 target nucleic acids are NOT DETECTED.  The SARS-CoV-2 RNA is generally detectable in upper  respiratory specimens during the acute phase of infection. The lowest concentration of SARS-CoV-2 viral copies this assay can detect is 138 copies/mL. A negative result does not preclude SARS-Cov-2 infection and should not be used as the sole basis for treatment or other patient management decisions. A negative result may occur with  improper specimen collection/handling, submission of specimen other than nasopharyngeal swab, presence of viral mutation(s) within the areas targeted by this assay, and inadequate number of viral copies(<138 copies/mL). A negative result must be combined with clinical observations, patient history, and epidemiological information. The expected result is Negative.  Fact Sheet for Patients:  EntrepreneurPulse.com.au  Fact Sheet for Healthcare Providers:  IncredibleEmployment.be  This test is no t yet approved or cleared by the [place] FDA and  has been authorized for detection and/or diagnosis of SARS-CoV-2 by FDA under an Emergency Use Authorization (EUA). This EUA will remain  in effect (meaning this test can be used) for the duration of the COVID-19 declaration under Section 564(b)(1) of the Act, 21 U.S.C.section 360bbb-3(b)(1), unless the authorization is terminated  or revoked sooner.       Influenza A by PCR NEGATIVE NEGATIVE Final   Influenza B by PCR NEGATIVE NEGATIVE Final    Comment: (NOTE) The Xpert Xpress SARS-CoV-2/FLU/RSV plus assay is intended as an aid in the diagnosis of influenza from Nasopharyngeal swab specimens and should not be used as a sole basis for treatment. Nasal washings and aspirates are unacceptable for  Xpert Xpress SARS-CoV-2/FLU/RSV testing.  Fact Sheet for Patients: EntrepreneurPulse.com.au  Fact Sheet for Healthcare Providers: IncredibleEmployment.be  This test is not yet approved or cleared by the [place] FDA and has been  authorized for detection and/or diagnosis of SARS-CoV-2 by FDA under an Emergency Use Authorization (EUA). This EUA will remain in effect (meaning this test can be used) for the duration of the COVID-19 declaration under Section 564(b)(1) of the Act, 21 U.S.C. section 360bbb-3(b)(1), unless the authorization is terminated or revoked.     Resp Syncytial Virus by PCR NEGATIVE NEGATIVE Final    Comment: (NOTE) Fact Sheet for Patients: EntrepreneurPulse.com.au  Fact Sheet for Healthcare Providers: IncredibleEmployment.be  This test is not yet approved or cleared by the [place] FDA and has been authorized for detection and/or diagnosis of SARS-CoV-2 by FDA under an Emergency Use Authorization (EUA). This EUA will remain in effect (meaning this test can be used) for the duration of the COVID-19 declaration under Section 564(b)(1) of the Act, 21 U.S.C. section 360bbb-3(b)(1), unless the authorization is terminated or revoked.  Performed at [facility], [place] [name], [place], [place] [address]      Radiological Exams on Admission: No results found.  EKG: Independently reviewed.  No acute ST-T wave changes  Assessment/Plan Active Problems:   * No active hospital problems. *   #1 diabetic ketoacidosis with high gap in the setting of uncontrolled type 2 diabetes with multiple hospital admissions and ER visits -patient missed her dose of insulin yesterday prior to cath.  She is admitted with nausea vomiting and diarrhea. Workup shows her UA does not reveal any evidence of UTI, CT abdomen and pelvis shows urinary bladder distention no evidence of pulmonary embolism. She denied fever or chills at home. Chest x-ray not done.  She denies cough shortness of breath. Will admitted to stepdown unit on Endo tool. N.p.o. IV fluids.  #2 hypothyroidism severe check TSH T3-T4 and restart Synthroid  #4 history of nonischemic  cardiomyopathy-currently her lungs are clear and continue IV fluids monitor closely  #5 gastroparesis secondary to uncontrolled diabetes symptomatic treatment  #6 acute urinary Retention Place, Foley  #7 orthostatic hypotension currently her blood pressure has been high Will hold midodrine IV fluids   Estimated body mass index is 24.43 kg/m as calculated from the following:   Height as of 01/06/23: 4' 11.5" (1.511 m).   Weight as of 01/06/23: 55.8 kg.   DVT prophylaxis: Lovenox  code Status: Full code Family Communication: None at bedside Disposition Plan: Home once stable  consults called: None Admission status: Inpatient   Georgette Shell MD   02/02/2023, 4:53 PM

## 2023-02-02 NOTE — ED Provider Notes (Signed)
[place] [facility]MENT AT [facility] Provider Note   CSN: [id number] Arrival date & time: 02/02/23  1402     History  Chief Complaint  Patient presents with   Nausea   Emesis   Hyperglycemia    [name] [name] is a 62 y.o. female.  Patient is a 62 year old female with a past medical history of diabetes presenting to the emergency department with nausea and vomiting.  Patient states that she had a left heart cath performed yesterday as surveillance with her prior history of cardiomyopathy.  She states that she was feeling fine prior to the procedure and after getting home last night she started to develop nausea and vomiting.  She states that she took Zofran at home however continued to vomit overnight.  She states that since her vomiting started and her blood sugars have increased.  She states that she has some mild diffuse abdominal discomfort.  She denies any chest pain or shortness of breath.  She denies any fevers or chills.  She states that she had 1 episode of diarrhea today.  She denies any dysuria or hematuria.  The history is provided by the patient.  Emesis Hyperglycemia Associated symptoms: vomiting        Home Medications Prior to Admission medications   Medication Sig Start Date End Date Taking? Authorizing Provider  aspirin EC 81 MG tablet Take 81 mg by mouth daily. Swallow whole.    [provider]  atorvastatin (LIPITOR) 80 MG tablet Take 1 tablet (80 mg total) by mouth daily. 01/12/22   Bedsole, Amy E, MD  Continuous Blood Gluc Sensor (DEXCOM G6 SENSOR) MISC INJECT 1 SENSOR INTO SKIN EVERY 10 DAYS 07/18/22   Bedsole, Amy E, MD  Continuous Blood Gluc Transmit (DEXCOM G6 TRANSMITTER) MISC USE AS DIRECTED FOR CONTINUOUS GLUCOSE MONITORING. REUSE TRANSMITTER X 90DAYS THEN DISCARD & REPLACE 07/05/21   Bedsole, Amy E, MD  eletriptan (RELPAX) 40 MG tablet Take 1 tablet (40 mg total) by mouth as needed for migraine or headache. May repeat in 2  hours if headache persists or recurs. Patient taking differently: Take 40 mg by mouth every 2 (two) hours as needed for migraine or headache. 08/14/22   Bedsole, Amy E, MD  insulin lispro (HUMALOG KWIKPEN) 100 UNIT/ML KwikPen INJECT INTO SKIN 3 TIMES A DAY AT MEAL TIME ACCORDING TO THE FOLLOWING SLIDING SCALE: 27 UNITS MAX PER DAY DX E11.43 Patient taking differently: Inject 1-4 Units into the skin with breakfast, with lunch, and with evening meal. Per sliding scale. 02/26/22   Bedsole, Amy E, MD  Insulin Pen Needle (PEN NEEDLES) 31G X 8 MM MISC Use to inject insulin 4 times a day.  Dx: E11.10 07/21/21   Jinny Sanders, MD  Lancets (ONETOUCH ULTRASOFT) lancets Use to check blood sugar daily as needed.  Dx: E11.10 12/23/17   Jinny Sanders, MD  levothyroxine (SYNTHROID) 137 MCG tablet Take 959 mcg by mouth every Sunday. 03/19/22   [provider]  midodrine (PROAMATINE) 5 MG tablet Take 1 tablet (5 mg total) by mouth 2 (two) times daily with a meal. 03/02/22   Minus Breeding, MD  oxybutynin (DITROPAN) 5 MG tablet Take 1 tablet (5 mg total) by mouth every 8 (eight) hours as needed for bladder spasms. 01/08/23   Danford, Suann Larry, MD  promethazine (PHENERGAN) 25 MG tablet Take 1 tablet (25 mg total) by mouth every 8 (eight) hours as needed for nausea or vomiting. 01/05/22   Diona Browner, Amy  E, MD  TOUJEO SOLOSTAR 300 UNIT/ML Solostar Pen Inject 6 Units into the skin at bedtime. 03/29/22   Regalado, Cassie Freer, MD      Allergies    Codeine    Review of Systems   Review of Systems  Gastrointestinal:  Positive for vomiting.    Physical Exam Updated Vital Signs BP (!) 166/104   Pulse (!) 122   Resp 16   LMP 08/11/2012   SpO2 99%  Physical Exam Vitals and nursing note reviewed.  Constitutional:      Appearance: Normal appearance. She is not toxic-appearing.     Comments: Uncomfortable appearing  HENT:     Head: Normocephalic and atraumatic.     Nose: Nose normal.     Mouth/Throat:      Mouth: Mucous membranes are dry.     Pharynx: Oropharynx is clear.  Eyes:     Extraocular Movements: Extraocular movements intact.     Conjunctiva/sclera: Conjunctivae normal.  Cardiovascular:     Rate and Rhythm: Normal rate and regular rhythm.     Heart sounds: Normal heart sounds.  Pulmonary:     Effort: Pulmonary effort is normal.     Breath sounds: Normal breath sounds.  Abdominal:     General: Abdomen is flat.     Palpations: Abdomen is soft.     Tenderness: There is abdominal tenderness (diffuse). There is no guarding or rebound.  Musculoskeletal:        General: Normal range of motion.     Cervical back: Normal range of motion and neck supple.  Skin:    General: Skin is warm and dry.  Neurological:     General: No focal deficit present.     Mental Status: She is alert and oriented to person, place, and time.  Psychiatric:        Mood and Affect: Mood normal.        Behavior: Behavior normal.     ED Results / Procedures / Treatments   Labs (all labs ordered are listed, but only abnormal results are displayed) Labs Reviewed  COMPREHENSIVE METABOLIC PANEL - Abnormal; Notable for the following components:      Result Value   Potassium 3.4 (*)    Chloride 95 (*)    Glucose, Bld 441 (*)    BUN 21 (*)    Total Protein 8.2 (*)    Anion gap 20 (*)    All other components within normal limits  CBC WITH DIFFERENTIAL/PLATELET - Abnormal; Notable for the following components:   WBC 13.9 (*)    RBC 5.44 (*)    Hemoglobin 15.4 (*)    HCT 47.0 (*)    Platelets 408 (*)    Neutro Abs 11.2 (*)    Abs Immature Granulocytes 0.12 (*)    All other components within normal limits  BLOOD GAS, VENOUS - Abnormal; Notable for the following components:   pH, Ven 7.46 (*)    pCO2, Ven 35 (*)    pO2, Ven 92 (*)    All other components within normal limits  BETA-HYDROXYBUTYRIC ACID - Abnormal; Notable for the following components:   Beta-Hydroxybutyric Acid 4.16 (*)    All other  components within normal limits  CBG MONITORING, ED - Abnormal; Notable for the following components:   Glucose-Capillary 457 (*)    All other components within normal limits  RESP PANEL BY RT-PCR (RSV, FLU A&B, COVID)  RVPGX2  LIPASE, BLOOD  URINALYSIS, ROUTINE W REFLEX MICROSCOPIC  LACTIC  ACID, PLASMA  LACTIC ACID, PLASMA  CBG MONITORING, ED    EKG EKG Interpretation  Date/Time:  Saturday February 02 2023 14:34:48 EST Ventricular Rate:  107 PR Interval:  166 QRS Duration: 81 QT Interval:  364 QTC Calculation: 486 R Axis:   94 Text Interpretation: Sinus tachycardia Borderline right axis deviation Borderline prolonged QT interval No significant change was found Confirmed by Ezequiel Essex [phone]) on 02/02/2023 2:48:29 PM  Radiology No results found.  Procedures Procedures    Medications Ordered in ED Medications  lactated ringers bolus 1,000 mL (has no administration in time range)  insulin regular, human (MYXREDLIN) 100 units/ 100 mL infusion (has no administration in time range)  lactated ringers infusion (has no administration in time range)  dextrose 5 % in lactated ringers infusion (has no administration in time range)  dextrose 50 % solution 0-50 mL (has no administration in time range)  potassium chloride 10 mEq in 100 mL IVPB (has no administration in time range)  metoCLOPramide (REGLAN) injection 10 mg (10 mg Intravenous Given 02/02/23 1429)  lactated ringers bolus 1,000 mL (1,000 mLs Intravenous New Bag/Given 02/02/23 1429)  ondansetron (ZOFRAN) injection 4 mg (4 mg Intravenous Given 02/02/23 1604)    ED Course/ Medical Decision Making/ A&P Clinical Course as of 02/02/23 1631  Sat Feb 02, 2023  1523 Patient is not acidotic but does have an elevated AGAP concerning for DKA. BHB and lactic acid ordered to evaluate for causes of AGAP metabolic acidosis. UA pending. [VK]    Clinical Course User Index [VK] Kemper Durie, DO                              Medical Decision Making This patient presents to the ED with chief complaint(s) of N/V with pertinent past medical history of DM which further complicates the presenting complaint. The complaint involves an extensive differential diagnosis and also carries with it a high risk of complications and morbidity.    The differential diagnosis includes DKA, hyperglycemic crisis, atypical ACS, hepatitis, pancreatitis, cholecystitis, cholelithiasis, gastroenteritis, viral syndrome, dehydration, electrolyte abnormality  Additional history obtained: Additional history obtained from N/A Records reviewed previous admission documents and Care Everywhere/External Records  ED Course and Reassessment: On patient's arrival to the emergency department she is uncomfortable and nauseous appearing though no acute distress.  She did receive Zofran and route by EMS and reported no improvement.  Patient will be given Reglan for possible gastroparesis and started on IV fluids.  Labs will be performed to evaluate for DKA or other intra-abdominal abnormality as a cause of her symptoms as well as EKG to evaluate for atypical ACS and she will be closely reassessed.  Independent labs interpretation:  The following labs were independently interpreted: Hyperglycemia with elevated AGAP and BHB consistent with DKA, mild hypokalemia and leukocytosis  Independent visualization of imaging: N/A  Consultation: - Consulted or discussed management/test interpretation w/ external professional: Hospitalist  Consideration for admission or further workup: patient requires admission for DKA Social Determinants of health: N/A    Amount and/or Complexity of Data Reviewed Labs: ordered.  Risk Prescription drug management. Decision regarding hospitalization.          Final Clinical Impression(s) / ED Diagnoses Final diagnoses:  Diabetic ketoacidosis without coma associated with type 2 diabetes mellitus ([place])    Rx / DC  Orders ED Discharge Orders     None         Maylon Peppers,  Norman Herrlich, DO 02/02/23 1631

## 2023-02-02 NOTE — ED Triage Notes (Signed)
Pt arrived via [facility]. EMS states blood sugar was 433 upon arrival. Gave '4mg'$  zofran and some NS fluid

## 2023-02-03 ENCOUNTER — Other Ambulatory Visit: Payer: Self-pay

## 2023-02-03 ENCOUNTER — Inpatient Hospital Stay (HOSPITAL_COMMUNITY): Payer: BC Managed Care – PPO

## 2023-02-03 ENCOUNTER — Encounter (HOSPITAL_COMMUNITY): Payer: Self-pay | Admitting: Internal Medicine

## 2023-02-03 DIAGNOSIS — E1143 Type 2 diabetes mellitus with diabetic autonomic (poly)neuropathy: Secondary | ICD-10-CM

## 2023-02-03 DIAGNOSIS — K3184 Gastroparesis: Secondary | ICD-10-CM

## 2023-02-03 DIAGNOSIS — N179 Acute kidney failure, unspecified: Secondary | ICD-10-CM | POA: Diagnosis present

## 2023-02-03 DIAGNOSIS — E1110 Type 2 diabetes mellitus with ketoacidosis without coma: Secondary | ICD-10-CM | POA: Diagnosis not present

## 2023-02-03 LAB — GLUCOSE, CAPILLARY
Glucose-Capillary: 169 mg/dL — ABNORMAL HIGH (ref 70–99)
Glucose-Capillary: 216 mg/dL — ABNORMAL HIGH (ref 70–99)
Glucose-Capillary: 174 mg/dL — ABNORMAL HIGH (ref 70–99)

## 2023-02-03 LAB — T4, FREE: Free T4: 0.66 ng/dL (ref 0.61–1.12)

## 2023-02-03 LAB — CBG MONITORING, ED
Glucose-Capillary: 130 mg/dL — ABNORMAL HIGH (ref 70–99)
Glucose-Capillary: 143 mg/dL — ABNORMAL HIGH (ref 70–99)
Glucose-Capillary: 160 mg/dL — ABNORMAL HIGH (ref 70–99)
Glucose-Capillary: 176 mg/dL — ABNORMAL HIGH (ref 70–99)
Glucose-Capillary: 179 mg/dL — ABNORMAL HIGH (ref 70–99)
Glucose-Capillary: 199 mg/dL — ABNORMAL HIGH (ref 70–99)

## 2023-02-03 LAB — LIPASE, BLOOD: Lipase: 98 U/L — ABNORMAL HIGH (ref 11–51)

## 2023-02-03 LAB — BASIC METABOLIC PANEL
Anion gap: 13 (ref 5–15)
Anion gap: 10 (ref 5–15)
Anion gap: 12 (ref 5–15)
BUN: 15 mg/dL (ref 6–20)
BUN: 13 mg/dL (ref 6–20)
BUN: 10 mg/dL (ref 6–20)
CO2: 27 mmol/L (ref 22–32)
CO2: 22 mmol/L (ref 22–32)
CO2: 23 mmol/L (ref 22–32)
Calcium: 8.6 mg/dL — ABNORMAL LOW (ref 8.9–10.3)
Calcium: 8.4 mg/dL — ABNORMAL LOW (ref 8.9–10.3)
Calcium: 8.4 mg/dL — ABNORMAL LOW (ref 8.9–10.3)
Chloride: 98 mmol/L (ref 98–111)
Chloride: 99 mmol/L (ref 98–111)
Chloride: 97 mmol/L — ABNORMAL LOW (ref 98–111)
Creatinine, Ser: 0.55 mg/dL (ref 0.44–1.00)
Creatinine, Ser: 0.46 mg/dL (ref 0.44–1.00)
Creatinine, Ser: 0.48 mg/dL (ref 0.44–1.00)
GFR, Estimated: >60 mL/min (ref >60)
GFR, Estimated: >60 mL/min (ref >60)
GFR, Estimated: >60 mL/min (ref >60)
Glucose, Bld: 196 mg/dL — ABNORMAL HIGH (ref 70–99)
Glucose, Bld: 213 mg/dL — ABNORMAL HIGH (ref 70–99)
Glucose, Bld: 159 mg/dL — ABNORMAL HIGH (ref 70–99)
Potassium: 3.1 mmol/L — ABNORMAL LOW (ref 3.5–5.1)
Potassium: 3.8 mmol/L (ref 3.5–5.1)
Potassium: 3.8 mmol/L (ref 3.5–5.1)
Sodium: 138 mmol/L (ref 135–145)
Sodium: 131 mmol/L — ABNORMAL LOW (ref 135–145)
Sodium: 132 mmol/L — ABNORMAL LOW (ref 135–145)

## 2023-02-03 LAB — BETA-HYDROXYBUTYRIC ACID
Beta-Hydroxybutyric Acid: 0.28 mmol/L — ABNORMAL HIGH (ref 0.05–0.27)
Beta-Hydroxybutyric Acid: 0.97 mmol/L — ABNORMAL HIGH (ref 0.05–0.27)

## 2023-02-03 LAB — LACTIC ACID, PLASMA: Lactic Acid, Venous: 0.9 mmol/L (ref 0.5–1.9)

## 2023-02-03 LAB — MAGNESIUM: Magnesium: 1.8 mg/dL (ref 1.7–2.4)

## 2023-02-03 MED ORDER — INSULIN ASPART 100 UNIT/ML IJ SOLN
0.0000 [IU] | Freq: Three times a day (TID) | INTRAMUSCULAR | Status: DC
  Administered 2023-02-03 (×3): 2 [IU] via SUBCUTANEOUS
  Administered 2023-02-04: 1 [IU] via SUBCUTANEOUS
  Administered 2023-02-04: 2 [IU] via SUBCUTANEOUS
  Administered 2023-02-04: 1 [IU] via SUBCUTANEOUS
  Administered 2023-02-05 – 2023-02-06 (×2): 2 [IU] via SUBCUTANEOUS
  Filled 2023-02-03: qty 0.09

## 2023-02-03 MED ORDER — LACTATED RINGERS IV SOLN: INTRAVENOUS | Status: AC

## 2023-02-03 MED ORDER — ACETAMINOPHEN 325 MG PO TABS
650.0000 mg | ORAL_TABLET | Freq: Four times a day (QID) | ORAL | Status: DC | PRN
  Administered 2023-02-03: 650 mg via ORAL
  Filled 2023-02-03: qty 2

## 2023-02-03 MED ORDER — INSULIN GLARGINE-YFGN 100 UNIT/ML ~~LOC~~ SOLN
4.0000 [IU] | SUBCUTANEOUS | Status: DC
  Administered 2023-02-03 – 2023-02-05 (×4): 4 [IU] via SUBCUTANEOUS
  Filled 2023-02-03 (×6): qty 0.04

## 2023-02-03 MED ORDER — PANTOPRAZOLE SODIUM 40 MG IV SOLR
40.0000 mg | Freq: Two times a day (BID) | INTRAVENOUS | Status: DC
  Administered 2023-02-03 – 2023-02-06 (×7): 40 mg via INTRAVENOUS
  Filled 2023-02-03 (×7): qty 10

## 2023-02-03 MED ORDER — POTASSIUM CHLORIDE 10 MEQ/100ML IV SOLN
10.0000 meq | INTRAVENOUS | Status: AC
  Administered 2023-02-03 (×4): 10 meq via INTRAVENOUS
  Filled 2023-02-03 (×4): qty 100

## 2023-02-03 MED ORDER — METOCLOPRAMIDE HCL 5 MG/ML IJ SOLN
10.0000 mg | Freq: Once | INTRAMUSCULAR | Status: AC
  Administered 2023-02-03: 10 mg via INTRAVENOUS
  Filled 2023-02-03: qty 2

## 2023-02-03 NOTE — ED Notes (Signed)
ED TO INPATIENT HANDOFF REPORT  ED Nurse Name and Phone #: [name] Name/Age/Gender [name] [name] 62 y.o. female Room/Bed: WA25/WA25  Code Status   Code Status: Full Code  Home/SNF/Other Home Patient oriented to: self, place, time, and situation Is this baseline? Yes   Triage Complete: Triage complete  Chief Complaint DKA (diabetic ketoacidosis) ([place]) [E11.10] AKI (acute kidney injury) ([place]) [N17.9]  Triage Note Pt arrived via [facility]. EMS states blood sugar was 433 upon arrival. Gave '4mg'$  zofran and some NS fluid     Allergies Allergies  Allergen Reactions   Codeine Hives, Anxiety and Other (See Comments)    Level of Care/Admitting Diagnosis ED Disposition     ED Disposition  Admit   Condition  --   [place]: [place] [[id number]]  Level of Care: Progressive [102]  Admit to Progressive based on following criteria: GI, ENDOCRINE disease patients with GI bleeding, acute liver failure or pancreatitis, stable with diabetic ketoacidosis or thyrotoxicosis (hypothyroid) state.  May admit patient to [facility] or [facility] if equivalent level of care is available:: Yes  Covid Evaluation: Asymptomatic - no recent exposure (last 10 days) testing not required  Diagnosis: AKI (acute kidney injury) [facility]) [id number]  Admitting Physician: Georgette Shell [id number]  Attending Physician: Georgette Shell [id number]  Certification:: I certify this patient will need inpatient services for at least 2 midnights          B Medical/Surgery History Past Medical History:  Diagnosis Date   Allergy    seasonal   Anxiety    B12 deficiency    Chest pain 04/05/2017   CHF (congestive heart failure) ([place])    Diabetes mellitus type 1, uncontrolled    "dr. 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) 10/29/2013   Leg cramping    "@ night" (09/18/2013)   Migraine    "once q couple months" (11/26/2018)   Osteoporosis    Peripheral neuropathy    Umbilical hernia    unrepaired (09/18/2013)   Past Surgical History:  Procedure Laterality Date   BIOPSY  06/03/2019   Procedure: BIOPSY;  Surgeon: Jackquline Denmark, MD;  [facility];  Service: Endoscopy;;   BIOPSY  08/17/2020   Procedure: BIOPSY;  Surgeon: Thornton Park, MD;  Location: [facility];  Service: Gastroenterology;;   [place]   ENTEROSCOPY N/A 06/03/2019   Procedure: ENTEROSCOPY;  Surgeon: Jackquline Denmark, MD;  [facility];  Service: Endoscopy;  Laterality: N/A;   ESOPHAGOGASTRODUODENOSCOPY (EGD) WITH PROPOFOL N/A 08/17/2020   Procedure: ESOPHAGOGASTRODUODENOSCOPY (EGD) WITH PROPOFOL;  Surgeon: Thornton Park, MD;  [facility];  Service: Gastroenterology;  Laterality: N/A;   LAPAROSCOPIC ASSISTED VAGINAL HYSTERECTOMY  09/23/2012   Procedure: LAPAROSCOPIC ASSISTED VAGINAL HYSTERECTOMY;  Surgeon: Linda Hedges, DO;  Location: [facility];  Service: Gynecology;  Laterality: N/A;  pull Dr Gregor Hams instrument   LEFT HEART CATH AND CORONARY ANGIOGRAPHY N/A 02/11/2017   Procedure: Left Heart Cath and Coronary Angiography;  Surgeon: Lorretta Harp, MD;  Location: [place] [facility];  Service: Cardiovascular;  Laterality: N/A;   TUBAL LIGATION Bilateral 1992     A IV Location/Drains/Wounds Patient Lines/Drains/Airways Status  Active Line/Drains/Airways     Name Placement date Placement time Site Days   Peripheral IV 02/02/23 18 G Right Antecubital 02/02/23  1413  Antecubital  1   Peripheral IV 02/02/23 20 G 1" Anterior;Left Forearm 02/02/23  1646  Forearm  1   Urethral Catheter B May RN 14 Fr. 01/07/23  1002  --  27   Wound / Incision (Open or Dehisced) 01/06/23  Laceration Toe (Comment  which one) Anterior;Right injury 01/06/23  2100  Toe (Comment  which one)  28            Intake/Output Last 24 hours  Intake/Output Summary (Last 24 hours) at 02/03/2023 1106 Last data filed at 02/03/2023 0300 Gross per 24 hour  Intake --  Output 900 ml  Net -900 ml    Labs/Imaging Results for orders placed or performed during the hospital encounter of 02/02/23 (from the past 48 hour(s))  POC CBG, ED     Status: Abnormal   Collection Time: 02/02/23  2:38 PM  Result Value Ref Range   Glucose-Capillary 457 (H) 70 - 99 mg/dL    Comment: Glucose reference range applies only to samples taken after fasting for at least 8 hours.  Resp panel by RT-PCR (RSV, Flu A&B, Covid) Anterior Nasal Swab     Status: None   Collection Time: 02/02/23  2:41 PM   Specimen: Anterior Nasal Swab  Result Value Ref Range   SARS Coronavirus 2 by RT PCR NEGATIVE NEGATIVE    Comment: (NOTE) SARS-CoV-2 target nucleic acids are NOT DETECTED.  The SARS-CoV-2 RNA is generally detectable in upper respiratory specimens during the acute phase of infection. The lowest concentration of SARS-CoV-2 viral copies this assay can detect is 138 copies/mL. A negative result does not preclude SARS-Cov-2 infection and should not be used as the sole basis for treatment or other patient management decisions. A negative result may occur with  improper specimen collection/handling, submission of specimen other than nasopharyngeal swab, presence of viral mutation(s) within the areas targeted by this assay, and inadequate number of viral copies(<138 copies/mL). A negative result must be combined with clinical observations, patient history, and epidemiological information. The expected result is Negative.  Fact Sheet for Patients:  EntrepreneurPulse.com.au  Fact Sheet for Healthcare Providers:  IncredibleEmployment.be  This test is no t yet approved or cleared by  the [place] FDA and  has been authorized for detection and/or diagnosis of SARS-CoV-2 by FDA under an Emergency Use Authorization (EUA). This EUA will remain  in effect (meaning this test can be used) for the duration of the COVID-19 declaration under Section 564(b)(1) of the Act, 21 U.S.C.section 360bbb-3(b)(1), unless the authorization is terminated  or revoked sooner.       Influenza A by PCR NEGATIVE NEGATIVE   Influenza B by PCR NEGATIVE NEGATIVE    Comment: (NOTE) The Xpert Xpress SARS-CoV-2/FLU/RSV plus assay is intended as an aid in the diagnosis of influenza from Nasopharyngeal swab specimens and should not be used as a sole basis for treatment. Nasal washings and aspirates are unacceptable for Xpert Xpress SARS-CoV-2/FLU/RSV testing.  Fact Sheet for Patients: EntrepreneurPulse.com.au  Fact Sheet for Healthcare Providers: IncredibleEmployment.be  This test is not yet approved or cleared by the [place] FDA and has been authorized for detection and/or diagnosis of SARS-CoV-2 by FDA under an Emergency Use Authorization (EUA). This EUA will remain in effect (meaning this test can be used) for the duration of the COVID-19 declaration under Section 564(b)(1) of the  Act, 21 U.S.C. section 360bbb-3(b)(1), unless the authorization is terminated or revoked.     Resp Syncytial Virus by PCR NEGATIVE NEGATIVE    Comment: (NOTE) Fact Sheet for Patients: EntrepreneurPulse.com.au  Fact Sheet for Healthcare Providers: IncredibleEmployment.be  This test is not yet approved or cleared by the [place] FDA and has been authorized for detection and/or diagnosis of SARS-CoV-2 by FDA under an Emergency Use Authorization (EUA). This EUA will remain in effect (meaning this test can be used) for the duration of the COVID-19 declaration under Section 564(b)(1) of the Act, 21 U.S.C. section  360bbb-3(b)(1), unless the authorization is terminated or revoked.  Performed at [facility], [place] [address], [place], [place] [address]   Comprehensive metabolic panel     Status: Abnormal   Collection Time: 02/02/23  2:50 PM  Result Value Ref Range   Sodium 138 135 - 145 mmol/L   Potassium 3.4 (L) 3.5 - 5.1 mmol/L   Chloride 95 (L) 98 - 111 mmol/L   CO2 23 22 - 32 mmol/L   Glucose, Bld 441 (H) 70 - 99 mg/dL    Comment: Glucose reference range applies only to samples taken after fasting for at least 8 hours.   BUN 21 (H) 6 - 20 mg/dL   Creatinine, Ser 0.82 0.44 - 1.00 mg/dL   Calcium 9.2 8.9 - 10.3 mg/dL   Total Protein 8.2 (H) 6.5 - 8.1 g/dL   Albumin 4.5 3.5 - 5.0 g/dL   AST 21 15 - 41 U/L   ALT 15 0 - 44 U/L   Alkaline Phosphatase 77 38 - 126 U/L   Total Bilirubin 0.9 0.3 - 1.2 mg/dL   GFR, Estimated >60 >60 mL/min    Comment: (NOTE) Calculated using the CKD-EPI Creatinine Equation (2021)    Anion gap 20 (H) 5 - 15    Comment: Performed at [facility], [place] [address] [place], [place] [address]  CBC with Differential     Status: Abnormal   Collection Time: 02/02/23  2:50 PM  Result Value Ref Range   WBC 13.9 (H) 4.0 - 10.5 K/uL   RBC 5.44 (H) 3.87 - 5.11 MIL/uL   Hemoglobin 15.4 (H) 12.0 - 15.0 g/dL   HCT 47.0 (H) 36.0 - 46.0 %   MCV 86.4 80.0 - 100.0 fL   MCH 28.3 26.0 - 34.0 pg   MCHC 32.8 30.0 - 36.0 g/dL   RDW 15.4 11.5 - 15.5 %   Platelets 408 (H) 150 - 400 K/uL   nRBC 0.0 0.0 - 0.2 %   Neutrophils Relative % 82 %   Neutro Abs 11.2 (H) 1.7 - 7.7 K/uL   Lymphocytes Relative 13 %   Lymphs Abs 1.8 0.7 - 4.0 K/uL   Monocytes Relative 4 %   Monocytes Absolute 0.6 0.1 - 1.0 K/uL   Eosinophils Relative 0 %   Eosinophils Absolute 0.0 0.0 - 0.5 K/uL   Basophils Relative 0 %   Basophils Absolute 0.1 0.0 - 0.1 K/uL   Immature Granulocytes 1 %   Abs Immature Granulocytes 0.12 (H) 0.00 - 0.07 K/uL    Comment: Performed at  [facility], [place] [address], [place], [place] [address]  Lipase, blood     Status: None   Collection Time: 02/02/23  2:50 PM  Result Value Ref Range   Lipase 40 11 - 51 U/L    Comment: Performed at [facility], [address] [address] [place], [place]  [address]  Blood gas, venous     Status: Abnormal   Collection Time: 02/02/23  2:50 PM  Result Value Ref Range   pH, Ven 7.46 (H) 7.25 - 7.43   pCO2, Ven 35 (L) 44 - 60 mmHg   pO2, Ven 92 (H) 32 - 45 mmHg   Bicarbonate 24.9 20.0 - 28.0 mmol/L   Acid-Base Excess 1.4 0.0 - 2.0 mmol/L   O2 Saturation 98.5 %   Patient temperature 37.0     Comment: Performed at [facility], [place] [address], [place], [place] [address]  Beta-hydroxybutyric acid     Status: Abnormal   Collection Time: 02/02/23  2:50 PM  Result Value Ref Range   Beta-Hydroxybutyric Acid 4.16 (H) 0.05 - 0.27 mmol/L    Comment: Performed at [facility], [place] [address] [place], [place] [address]  Lactic acid, plasma     Status: Abnormal   Collection Time: 02/02/23  4:15 PM  Result Value Ref Range   Lactic Acid, Venous 2.5 (HH) 0.5 - 1.9 mmol/L    Comment: CRITICAL RESULT CALLED TO, READ BACK BY AND VERIFIED WITH GRAVEL C. RN AT 1710 ON 02/02/2023 BY MECIAL J. Performed at [facility], [place] [address], [place], [place] [address]   Urinalysis, Routine w reflex microscopic -Urine, Clean Catch     Status: Abnormal   Collection Time: 02/02/23  5:45 PM  Result Value Ref Range   Color, Urine YELLOW YELLOW   APPearance CLOUDY (A) CLEAR   Specific Gravity, Urine 1.027 1.005 - 1.030   pH 5.0 5.0 - 8.0   Glucose, UA >=500 (A) NEGATIVE mg/dL   Hgb urine dipstick NEGATIVE NEGATIVE   Bilirubin Urine NEGATIVE NEGATIVE   Ketones, ur 20 (A) NEGATIVE mg/dL   Protein, ur 30 (A) NEGATIVE mg/dL   Nitrite NEGATIVE NEGATIVE   Leukocytes,Ua NEGATIVE NEGATIVE   RBC / HPF 0-5 0 - 5 RBC/hpf   WBC, UA 21-50  0 - 5 WBC/hpf   Bacteria, UA FEW (A) NONE SEEN   Squamous Epithelial / HPF 0-5 0 - 5 /HPF    Comment: Performed at [facility], [place] [address] [place], [place] [address]  CBG monitoring, ED     Status: Abnormal   Collection Time: 02/02/23  6:15 PM  Result Value Ref Range   Glucose-Capillary 375 (H) 70 - 99 mg/dL    Comment: Glucose reference range applies only to samples taken after fasting for at least 8 hours.  CBC     Status: Abnormal   Collection Time: 02/02/23  7:00 PM  Result Value Ref Range   WBC 13.3 (H) 4.0 - 10.5 K/uL   RBC 4.87 3.87 - 5.11 MIL/uL   Hemoglobin 13.9 12.0 - 15.0 g/dL   HCT 42.7 36.0 - 46.0 %   MCV 87.7 80.0 - 100.0 fL   MCH 28.5 26.0 - 34.0 pg   MCHC 32.6 30.0 - 36.0 g/dL   RDW 15.3 11.5 - 15.5 %   Platelets 352 150 - 400 K/uL   nRBC 0.0 0.0 - 0.2 %    Comment: Performed at [facility], [place] [address] [place], [place] [address]  Beta-hydroxybutyric acid     Status: Abnormal   Collection Time: 02/02/23  7:00 PM  Result Value Ref Range   Beta-Hydroxybutyric Acid 3.68 (H) 0.05 - 0.27 mmol/L    Comment: Performed at [facility], [place] [address], [place], [place] [address]  TSH  Status: Abnormal   Collection Time: 02/02/23  7:00 PM  Result Value Ref Range   TSH 24.206 (H) 0.350 - 4.500 uIU/mL    Comment: Performed by a 3rd Generation assay with a functional sensitivity of <=0.01 uIU/mL. Performed at [facility], [place] [address], [place], [place] [address]   CBG monitoring, ED     Status: Abnormal   Collection Time: 02/02/23  7:15 PM  Result Value Ref Range   Glucose-Capillary 287 (H) 70 - 99 mg/dL    Comment: Glucose reference range applies only to samples taken after fasting for at least 8 hours.  CBG monitoring, ED     Status: Abnormal   Collection Time: 02/02/23  8:15 PM  Result Value Ref Range   Glucose-Capillary 221 (H) 70 - 99 mg/dL    Comment: Glucose reference  range applies only to samples taken after fasting for at least 8 hours.  Basic metabolic panel     Status: Abnormal   Collection Time: 02/02/23  8:23 PM  Result Value Ref Range   Sodium 136 135 - 145 mmol/L   Potassium 3.3 (L) 3.5 - 5.1 mmol/L   Chloride 104 98 - 111 mmol/L   CO2 24 22 - 32 mmol/L   Glucose, Bld 222 (H) 70 - 99 mg/dL    Comment: Glucose reference range applies only to samples taken after fasting for at least 8 hours.   BUN 15 6 - 20 mg/dL   Creatinine, Ser 0.59 0.44 - 1.00 mg/dL   Calcium 8.0 (L) 8.9 - 10.3 mg/dL   GFR, Estimated >60 >60 mL/min    Comment: (NOTE) Calculated using the CKD-EPI Creatinine Equation (2021)    Anion gap 8 5 - 15    Comment: Performed at [facility], [place] [address], [place], [place] [address]  Lactic acid, plasma     Status: None   Collection Time: 02/02/23  9:11 PM  Result Value Ref Range   Lactic Acid, Venous 1.7 0.5 - 1.9 mmol/L    Comment: Performed at [facility], [place] [address] [place], [place] [id number]  Basic metabolic panel     Status: Abnormal   Collection Time: 02/02/23  9:14 PM  Result Value Ref Range   Sodium 138 135 - 145 mmol/L   Potassium 3.3 (L) 3.5 - 5.1 mmol/L   Chloride 101 98 - 111 mmol/L   CO2 25 22 - 32 mmol/L   Glucose, Bld 204 (H) 70 - 99 mg/dL    Comment: Glucose reference range applies only to samples taken after fasting for at least 8 hours.   BUN 14 6 - 20 mg/dL   Creatinine, Ser 0.60 0.44 - 1.00 mg/dL   Calcium 8.2 (L) 8.9 - 10.3 mg/dL   GFR, Estimated >60 >60 mL/min    Comment: (NOTE) Calculated using the CKD-EPI Creatinine Equation (2021)    Anion gap 12 5 - 15    Comment: Performed at [facility], [place] [address] [place], [place] [address]  CBG monitoring, ED     Status: Abnormal   Collection Time: 02/02/23  9:24 PM  Result Value Ref Range   Glucose-Capillary 162 (H) 70 - 99 mg/dL    Comment: Glucose reference range applies only to  samples taken after fasting for at least 8 hours.  CBG monitoring, ED     Status: Abnormal   Collection Time: 02/02/23 10:28 PM  Result Value Ref Range   Glucose-Capillary 162 (H) 70 -  99 mg/dL    Comment: Glucose reference range applies only to samples taken after fasting for at least 8 hours.  CBG monitoring, ED     Status: Abnormal   Collection Time: 02/02/23 11:32 PM  Result Value Ref Range   Glucose-Capillary 196 (H) 70 - 99 mg/dL    Comment: Glucose reference range applies only to samples taken after fasting for at least 8 hours.  CBG monitoring, ED     Status: Abnormal   Collection Time: 02/03/23 12:36 AM  Result Value Ref Range   Glucose-Capillary 199 (H) 70 - 99 mg/dL    Comment: Glucose reference range applies only to samples taken after fasting for at least 8 hours.  Basic metabolic panel     Status: Abnormal   Collection Time: 02/03/23  1:19 AM  Result Value Ref Range   Sodium 138 135 - 145 mmol/L   Potassium 3.1 (L) 3.5 - 5.1 mmol/L   Chloride 98 98 - 111 mmol/L   CO2 27 22 - 32 mmol/L   Glucose, Bld 196 (H) 70 - 99 mg/dL    Comment: Glucose reference range applies only to samples taken after fasting for at least 8 hours.   BUN 15 6 - 20 mg/dL   Creatinine, Ser 0.55 0.44 - 1.00 mg/dL   Calcium 8.6 (L) 8.9 - 10.3 mg/dL   GFR, Estimated >60 >60 mL/min    Comment: (NOTE) Calculated using the CKD-EPI Creatinine Equation (2021)    Anion gap 13 5 - 15    Comment: Performed at [facility], [place] [address] [place], [place] [address]  Beta-hydroxybutyric acid     Status: Abnormal   Collection Time: 02/03/23  1:19 AM  Result Value Ref Range   Beta-Hydroxybutyric Acid 0.28 (H) 0.05 - 0.27 mmol/L    Comment: Performed at [facility], [place] [address] [place], [place] [address]  CBG monitoring, ED     Status: Abnormal   Collection Time: 02/03/23  1:43 AM  Result Value Ref Range   Glucose-Capillary 176 (H) 70 - 99 mg/dL    Comment:  Glucose reference range applies only to samples taken after fasting for at least 8 hours.  T4, free     Status: None   Collection Time: 02/03/23  1:54 AM  Result Value Ref Range   Free T4 0.66 0.61 - 1.12 ng/dL    Comment: (NOTE) Biotin ingestion may interfere with free T4 tests. If the results are inconsistent with the TSH level, previous test results, or the clinical presentation, then consider biotin interference. If needed, order repeat testing after stopping biotin. Performed at [facility], [place] [address] [place], [place] [address]   CBG monitoring, ED     Status: Abnormal   Collection Time: 02/03/23  2:53 AM  Result Value Ref Range   Glucose-Capillary 143 (H) 70 - 99 mg/dL    Comment: Glucose reference range applies only to samples taken after fasting for at least 8 hours.  CBG monitoring, ED     Status: Abnormal   Collection Time: 02/03/23  4:00 AM  Result Value Ref Range   Glucose-Capillary 130 (H) 70 - 99 mg/dL    Comment: Glucose reference range applies only to samples taken after fasting for at least 8 hours.  CBG monitoring, ED     Status: Abnormal   Collection Time: 02/03/23  5:01 AM  Result Value Ref Range   Glucose-Capillary 179 (H) 70 - 99 mg/dL  Comment: Glucose reference range applies only to samples taken after fasting for at least 8 hours.  Basic metabolic panel     Status: Abnormal   Collection Time: 02/03/23  5:36 AM  Result Value Ref Range   Sodium 131 (L) 135 - 145 mmol/L    Comment: DELTA CHECK NOTED   Potassium 3.8 3.5 - 5.1 mmol/L    Comment: DELTA CHECK NOTED   Chloride 99 98 - 111 mmol/L   CO2 22 22 - 32 mmol/L   Glucose, Bld 213 (H) 70 - 99 mg/dL    Comment: Glucose reference range applies only to samples taken after fasting for at least 8 hours.   BUN 13 6 - 20 mg/dL   Creatinine, Ser 0.46 0.44 - 1.00 mg/dL   Calcium 8.4 (L) 8.9 - 10.3 mg/dL   GFR, Estimated >60 >60 mL/min    Comment: (NOTE) Calculated using the CKD-EPI Creatinine  Equation (2021)    Anion gap 10 5 - 15    Comment: Performed at [facility], [place] [address] [place], [place] [address]  CBG monitoring, ED     Status: Abnormal   Collection Time: 02/03/23  8:00 AM  Result Value Ref Range   Glucose-Capillary 160 (H) 70 - 99 mg/dL    Comment: Glucose reference range applies only to samples taken after fasting for at least 8 hours.  Basic metabolic panel     Status: Abnormal   Collection Time: 02/03/23  8:40 AM  Result Value Ref Range   Sodium 132 (L) 135 - 145 mmol/L   Potassium 3.8 3.5 - 5.1 mmol/L   Chloride 97 (L) 98 - 111 mmol/L   CO2 23 22 - 32 mmol/L   Glucose, Bld 159 (H) 70 - 99 mg/dL    Comment: Glucose reference range applies only to samples taken after fasting for at least 8 hours.   BUN 10 6 - 20 mg/dL   Creatinine, Ser 0.48 0.44 - 1.00 mg/dL   Calcium 8.4 (L) 8.9 - 10.3 mg/dL   GFR, Estimated >60 >60 mL/min    Comment: (NOTE) Calculated using the CKD-EPI Creatinine Equation (2021)    Anion gap 12 5 - 15    Comment: Performed at [facility] [place] [address], [place], [place] [address]  Beta-hydroxybutyric acid     Status: Abnormal   Collection Time: 02/03/23  8:40 AM  Result Value Ref Range   Beta-Hydroxybutyric Acid 0.97 (H) 0.05 - 0.27 mmol/L    Comment: Performed at [facility], [place] [address], [place], [place] [address]   CT ABDOMEN PELVIS WO CONTRAST  Result Date: 02/03/2023 CLINICAL DATA:  Nausea, vomiting and diarrhea. EXAM: CT ABDOMEN AND PELVIS WITHOUT CONTRAST TECHNIQUE: Multidetector CT imaging of the abdomen and pelvis was performed following the standard protocol without IV contrast. RADIATION DOSE REDUCTION: This exam was performed according to the departmental dose-optimization program which includes automated exposure control, adjustment of the mA and/or kV according to patient size and/or use of iterative reconstruction technique. COMPARISON:  January 06, 2023  FINDINGS: Lower chest: No acute abnormality. Hepatobiliary: The liver is enlarged. No focal liver abnormality is seen. No gallstones, gallbladder wall thickening, or biliary dilatation. Pancreas: Unremarkable. No pancreatic ductal dilatation or surrounding inflammatory changes. Spleen: Normal in size without focal abnormality. Adrenals/Urinary Tract: Adrenal glands are unremarkable. Kidneys are normal, without renal calculi, focal lesion, or hydronephrosis. The urinary bladder is markedly distended. Stomach/Bowel: Stable, moderate severity circumferential thickening of the visualized  portion of the distal esophagus is seen. There is marked severity gastric distension. Mild thickening of the gastric antrum and duodenal bulb is also seen. The remaining large and small bowel loops within the abdomen are normal in caliber. Appendix appears normal. Noninflamed diverticula are seen throughout the large bowel. Vascular/Lymphatic: Aortic atherosclerosis. No enlarged abdominal or pelvic lymph nodes. Reproductive: Status post hysterectomy. No adnexal masses. Other: No abdominal wall hernia or abnormality. No abdominopelvic ascites. Musculoskeletal: No acute or significant osseous findings. IMPRESSION: 1. Stable circumferential thickening of the distal esophagus, which may represent sequelae associated with esophagitis. 2. Findings which may represent mild gastritis and/or duodenitis with subsequent partial gastric outlet obstruction. 3. Colonic diverticulosis. 4. Markedly distended urinary bladder. 5. Aortic atherosclerosis. Aortic Atherosclerosis (ICD10-I70.0). Electronically Signed   By: [name] M.D.   On: 02/03/2023 04:17    Pending Labs Unresulted Labs (From admission, onward)     Start     Ordered   02/09/23 0500  Creatinine, serum  (enoxaparin (LOVENOX)    CrCl >/= 30 ml/min)  Weekly,   R     Comments: while on enoxaparin therapy    02/02/23 1659   02/02/23 1733  T3  Once,   R        02/02/23 1732    02/02/23 1713  Lactic acid, plasma  STAT Now then every 3 hours,   R (with STAT occurrences)      02/02/23 1712   02/02/23 1711  Hemoglobin A1c  Once,   R        02/02/23 1710            Vitals/Pain Today's Vitals   02/03/23 0409 02/03/23 0630 02/03/23 0730 02/03/23 0801  BP:  (!) 150/86 (!) 162/97 (!) 164/87  Pulse:  92 97 91  Resp:  '19 17 14  '$ Temp:    98.6 F (37 C)  TempSrc:    Oral  SpO2:  95% 96% 95%  Weight:      PainSc: 0-No pain       Isolation Precautions Airborne and Contact precautions  Medications Medications  enoxaparin (LOVENOX) injection 40 mg (40 mg Subcutaneous Given 02/02/23 1823)  lactated ringers bolus 1,116 mL (1,116 mLs Intravenous Not Given 02/03/23 0304)  dextrose 50 % solution 0-50 mL (has no administration in time range)  metoprolol tartrate (LOPRESSOR) injection 5 mg (has no administration in time range)  ondansetron (ZOFRAN) injection 4 mg (4 mg Intravenous Given 02/02/23 2121)  insulin glargine-yfgn (SEMGLEE) injection 4 Units (4 Units Subcutaneous Given 02/03/23 0254)  insulin aspart (novoLOG) injection 0-9 Units (2 Units Subcutaneous Given 02/03/23 0825)  acetaminophen (TYLENOL) tablet 650 mg (650 mg Oral Given 02/03/23 0315)  lactated ringers infusion ( Intravenous New Bag/Given 02/03/23 0533)  metoCLOPramide (REGLAN) injection 10 mg (10 mg Intravenous Given 02/02/23 1429)  lactated ringers bolus 1,000 mL (0 mLs Intravenous Stopped 02/02/23 1752)  lactated ringers bolus 1,000 mL (0 mLs Intravenous Stopped 02/02/23 1752)  potassium chloride 10 mEq in 100 mL IVPB (0 mEq Intravenous Stopped 02/02/23 2344)  ondansetron (ZOFRAN) injection 4 mg (4 mg Intravenous Given 02/02/23 1604)  ketorolac (TORADOL) 30 MG/ML injection 30 mg (30 mg Intravenous Given 02/02/23 2110)  prochlorperazine (COMPAZINE) injection 10 mg (10 mg Intravenous Given 02/03/23 0911)  potassium chloride 10 mEq in 100 mL IVPB (0 mEq Intravenous Stopped 02/03/23 0743)  metoCLOPramide  (REGLAN) injection 10 mg (10 mg Intravenous Given 02/03/23 0256)    Mobility walks with person assist  Focused Assessments Cardiac Assessment Handoff:    Lab Results  Component Value Date   CKTOTAL 60 09/18/2013   CKMB 2.7 09/18/2013   TROPONINI <0.03 03/25/2019   No results found for: "DDIMER" Does the Patient currently have chest pain? No    R Recommendations: See Admitting Provider Note  Report given to:   Additional Notes:

## 2023-02-03 NOTE — Progress Notes (Signed)
PROGRESS NOTE    [name] [name]  [id number] DOB: 10-04-1961 DOA: 02/02/2023 PCP: Rockne Menghini, NP   Brief Narrative:  [name] [name] is a 62 y.o. female with medical history significant of uncontrolled type 2 diabetes complicated by multiple hospital admissions.  Patient has had 2 admissions in January 2024 and 2 ER visits this year so far.  She had a hospital admission in [place] in January 2024. Patient had a heart cath at [facility] yesterday she was told not to take her insulin so she did not take it yesterday however she took her insulin today.  No intervention was recommended after the cath.  She denied chest pain shortness of breath fever chills cough.  She complains of nausea vomiting and diarrhea multiple times.  She denies abdominal pain.  The cath was done to the right groin where she really has no complaints of pain there.   Patient had the last hospital admission on January 08, 2023 When she was diagnosed with DKA.  At that time workup revealed a very elevated TSH of 50.  She was told to repeat her TSH in 2 weeks but there is no documentation of that in the EMR.  She reports taking all her medications as prescribed. She denies dysuria frequency. ED Course: CT of the abdomen and pelvis concerning for bladder distention and urinary retention. Blood pressure 166/104 pulse is 116 respirations 16 she is afebrile saturating 99% on room air. She was started on insulin drip. Her sodium was 138 potassium was 3.4 BUN was 21 creatinine 0.82 she had anion gap of 20. White count 13.9.  Hemoglobin 15.4.  Beta hydroxybutyrate level is 4.16.  Last hemoglobin A1c was 13 in January 2024. UA shows ketones and proteinuria. LA 2.5  Assessment & Plan:   Active Problems:   Gastroparesis due to DM ([place])   Acute urinary retention   Hypothyroidism   Hyperlipidemia   GERD   DKA (diabetic ketoacidosis) ([place])   AKI (acute kidney injury) ([place])  #1 diabetic ketoacidosis with high gap in the setting of  uncontrolled type 2 diabetes with multiple hospital admissions and ER visits -patient missed her dose of insulin prior to cath.  She was admitted with nausea and vomiting.  She is still nauseous and is on clear liquids. Workup shows her UA does not reveal any evidence of UTI,  CT abdomen and pelvis shows circumferential thickening of the distal esophagus, mild gastritis or duodenitis with subsequent partial gastric outlet obstruction.  Colonic diverticulosis.   She is having flatus.   She has not had a bowel movement since yesterday.   Distended urinary bladder. CHECK KUB Bladder scan    #2 hypothyroidism severe -TSH 24, T40.66.  TSH trending down.  Continue Synthroid.   #4 history of nonischemic cardiomyopathy-currently her lungs are clear and continue IV fluids monitor closely   #5 gastroparesis secondary to uncontrolled diabetes symptomatic treatment   #6  This tender bladder check bladder scan patient has pure wick in place with good urine output.  #7 orthostatic hypotension currently her blood pressure has been high Will hold midodrine IV fluids  #8 GOO-by ct-she is  having flatus and moving bowels On clears  Follow up kub    Estimated body mass index is 23.64 kg/m as calculated from the following:   Height as of this encounter: 4' 11.5" (1.511 m).   Weight as of this encounter: 54 kg.  DVT prophylaxis: lovenox Code Status:full Family Communication:none Disposition Plan:  Status is:  Inpatient Remains inpatient appropriate because: dka vomiting   Consultants:  gi  Procedures: none Antimicrobials:none  Subjective: C/o nausea and abdominal pain On clear liquids  Objective: Vitals:   02/03/23 0730 02/03/23 0801 02/03/23 1152 02/03/23 1212  BP: (!) 162/97 (!) 164/87 (!) 152/83   Pulse: 97 91 (!) 103   Resp: '17 14 18   '$ Temp:  98.6 F (37 C) 98.2 F (36.8 C)   TempSrc:  Oral Oral   SpO2: 96% 95% 100%   Weight:    54 kg  Height:    4' 11.5" (1.511 m)     Intake/Output Summary (Last 24 hours) at 02/03/2023 1254 Last data filed at 02/03/2023 0300 Gross per 24 hour  Intake --  Output 900 ml  Net -900 ml   Filed Weights   02/02/23 1700 02/02/23 1801 02/03/23 1212  Weight: 55.8 kg 52.2 kg 54 kg    Examination:  General exam: Appears calm and comfortable  Respiratory system: Clear to auscultation. Respiratory effort normal. Cardiovascular system: S1 & S2 heard, RRR. No JVD, murmurs, rubs, gallops or clicks. No pedal edema. Gastrointestinal system: Abdomen is nondistended, soft and tender. No organomegaly or masses felt. Normal bowel sounds heard. Central nervous system: Alert and oriented. No focal neurological deficits. Extremities: no edema Skin: No rashes, lesions or ulcers Psychiatry: Judgement and insight appear normal. Mood & affect appropriate.     Data Reviewed: I have personally reviewed following labs and imaging studies  CBC: Recent Labs  Lab 02/02/23 1450 02/02/23 1900  WBC 13.9* 13.3*  NEUTROABS 11.2*  --   HGB 15.4* 13.9  HCT 47.0* 42.7  MCV 86.4 87.7  PLT 408* [id number]   Basic Metabolic Panel: Recent Labs  Lab 02/02/23 2023 02/02/23 2114 02/03/23 0119 02/03/23 0536 02/03/23 0840  NA 136 138 138 131* 132*  K 3.3* 3.3* 3.1* 3.8 3.8  CL 104 101 98 99 97*  CO2 '24 25 27 22 23  '$ GLUCOSE 222* 204* 196* 213* 159*  BUN '15 14 15 13 10  '$ CREATININE 0.59 0.60 0.55 0.46 0.48  CALCIUM 8.0* 8.2* 8.6* 8.4* 8.4*   GFR: Estimated Creatinine Clearance: 57.6 mL/min (by C-G formula based on SCr of 0.48 mg/dL). Liver Function Tests: Recent Labs  Lab 02/02/23 1450  AST 21  ALT 15  ALKPHOS 77  BILITOT 0.9  PROT 8.2*  ALBUMIN 4.5   Recent Labs  Lab 02/02/23 1450  LIPASE 40   No results for input(s): "AMMONIA" in the last 168 hours. Coagulation Profile: No results for input(s): "INR", "PROTIME" in the last 168 hours. Cardiac Enzymes: No results for input(s): "CKTOTAL", "CKMB", "CKMBINDEX", "TROPONINI" in the  last 168 hours. BNP (last 3 results) No results for input(s): "PROBNP" in the last 8760 hours. HbA1C: No results for input(s): "HGBA1C" in the last 72 hours. CBG: Recent Labs  Lab 02/03/23 0253 02/03/23 0400 02/03/23 0501 02/03/23 0800 02/03/23 1233  GLUCAP 143* 130* 179* 160* 169*   Lipid Profile: No results for input(s): "CHOL", "HDL", "LDLCALC", "TRIG", "CHOLHDL", "LDLDIRECT" in the last 72 hours. Thyroid Function Tests: Recent Labs    02/02/23 1900 02/03/23 0154  TSH 24.206*  --   FREET4  --  0.66   Anemia Panel: No results for input(s): "VITAMINB12", "FOLATE", "FERRITIN", "TIBC", "IRON", "RETICCTPCT" in the last 72 hours. Sepsis Labs: Recent Labs  Lab 02/02/23 1615 02/02/23 2111  LATICACIDVEN 2.5* 1.7    Recent Results (from the past 240 hour(s))  Resp panel by RT-PCR (RSV, Flu A&B,  Covid) Anterior Nasal Swab     Status: None   Collection Time: 02/02/23  2:41 PM   Specimen: Anterior Nasal Swab  Result Value Ref Range Status   SARS Coronavirus 2 by RT PCR NEGATIVE NEGATIVE Final    Comment: (NOTE) SARS-CoV-2 target nucleic acids are NOT DETECTED.  The SARS-CoV-2 RNA is generally detectable in upper respiratory specimens during the acute phase of infection. The lowest concentration of SARS-CoV-2 viral copies this assay can detect is 138 copies/mL. A negative result does not preclude SARS-Cov-2 infection and should not be used as the sole basis for treatment or other patient management decisions. A negative result may occur with  improper specimen collection/handling, submission of specimen other than nasopharyngeal swab, presence of viral mutation(s) within the areas targeted by this assay, and inadequate number of viral copies(<138 copies/mL). A negative result must be combined with clinical observations, patient history, and epidemiological information. The expected result is Negative.  Fact Sheet for Patients:   EntrepreneurPulse.com.au  Fact Sheet for Healthcare Providers:  IncredibleEmployment.be  This test is no t yet approved or cleared by the [place] FDA and  has been authorized for detection and/or diagnosis of SARS-CoV-2 by FDA under an Emergency Use Authorization (EUA). This EUA will remain  in effect (meaning this test can be used) for the duration of the COVID-19 declaration under Section 564(b)(1) of the Act, 21 U.S.C.section 360bbb-3(b)(1), unless the authorization is terminated  or revoked sooner.       Influenza A by PCR NEGATIVE NEGATIVE Final   Influenza B by PCR NEGATIVE NEGATIVE Final    Comment: (NOTE) The Xpert Xpress SARS-CoV-2/FLU/RSV plus assay is intended as an aid in the diagnosis of influenza from Nasopharyngeal swab specimens and should not be used as a sole basis for treatment. Nasal washings and aspirates are unacceptable for Xpert Xpress SARS-CoV-2/FLU/RSV testing.  Fact Sheet for Patients: EntrepreneurPulse.com.au  Fact Sheet for Healthcare Providers: IncredibleEmployment.be  This test is not yet approved or cleared by the [place] FDA and has been authorized for detection and/or diagnosis of SARS-CoV-2 by FDA under an Emergency Use Authorization (EUA). This EUA will remain in effect (meaning this test can be used) for the duration of the COVID-19 declaration under Section 564(b)(1) of the Act, 21 U.S.C. section 360bbb-3(b)(1), unless the authorization is terminated or revoked.     Resp Syncytial Virus by PCR NEGATIVE NEGATIVE Final    Comment: (NOTE) Fact Sheet for Patients: EntrepreneurPulse.com.au  Fact Sheet for Healthcare Providers: IncredibleEmployment.be  This test is not yet approved or cleared by the [place] FDA and has been authorized for detection and/or diagnosis of SARS-CoV-2 by FDA under an Emergency Use  Authorization (EUA). This EUA will remain in effect (meaning this test can be used) for the duration of the COVID-19 declaration under Section 564(b)(1) of the Act, 21 U.S.C. section 360bbb-3(b)(1), unless the authorization is terminated or revoked.  Performed at [facility], [place] [address] [place], [place] [address]          Radiology Studies: CT ABDOMEN PELVIS WO CONTRAST  Result Date: 02/03/2023 CLINICAL DATA:  Nausea, vomiting and diarrhea. EXAM: CT ABDOMEN AND PELVIS WITHOUT CONTRAST TECHNIQUE: Multidetector CT imaging of the abdomen and pelvis was performed following the standard protocol without IV contrast. RADIATION DOSE REDUCTION: This exam was performed according to the departmental dose-optimization program which includes automated exposure control, adjustment of the mA and/or kV according to patient size and/or use of iterative reconstruction technique. COMPARISON:  January 06, 2023 FINDINGS: Lower chest: No acute abnormality. Hepatobiliary: The liver is enlarged. No focal liver abnormality is seen. No gallstones, gallbladder wall thickening, or biliary dilatation. Pancreas: Unremarkable. No pancreatic ductal dilatation or surrounding inflammatory changes. Spleen: Normal in size without focal abnormality. Adrenals/Urinary Tract: Adrenal glands are unremarkable. Kidneys are normal, without renal calculi, focal lesion, or hydronephrosis. The urinary bladder is markedly distended. Stomach/Bowel: Stable, moderate severity circumferential thickening of the visualized portion of the distal esophagus is seen. There is marked severity gastric distension. Mild thickening of the gastric antrum and duodenal bulb is also seen. The remaining large and small bowel loops within the abdomen are normal in caliber. Appendix appears normal. Noninflamed diverticula are seen throughout the large bowel. Vascular/Lymphatic: Aortic atherosclerosis. No enlarged abdominal or pelvic lymph  nodes. Reproductive: Status post hysterectomy. No adnexal masses. Other: No abdominal wall hernia or abnormality. No abdominopelvic ascites. Musculoskeletal: No acute or significant osseous findings. IMPRESSION: 1. Stable circumferential thickening of the distal esophagus, which may represent sequelae associated with esophagitis. 2. Findings which may represent mild gastritis and/or duodenitis with subsequent partial gastric outlet obstruction. 3. Colonic diverticulosis. 4. Markedly distended urinary bladder. 5. Aortic atherosclerosis. Aortic Atherosclerosis (ICD10-I70.0). Electronically Signed   By: [name] M.D.   On: 02/03/2023 04:17      Scheduled Meds:  enoxaparin (LOVENOX) injection  40 mg Subcutaneous Q24H   insulin aspart  0-9 Units Subcutaneous TID WC   insulin glargine-yfgn  4 Units Subcutaneous Q24H   Continuous Infusions:  lactated ringers     lactated ringers Stopped (02/03/23 1149)     LOS: 1 day    Time spent: [place]  Georgette Shell, MD 02/03/2023, 12:54 PM

## 2023-02-03 NOTE — Progress Notes (Signed)
       CROSS COVER NOTE  NAME: [name] [name] MRN: [id number] DOB : October 05, 1961    Date of Service   02/03/2023     HPI/Events of Note   65-patient with history of gastroparesis is complaining of upper abdominal pain.  She also had episodes of nausea and vomiting that were relieved with Compazine.    Order placed for CT abdomen pelvis without contrast.  Patient unable to tolerate fluids at this time.  IV Reglan x 1 also ordered.  Although patient has been transitioned off insulin gtt., will keep patient n.p.o. for now.  0400-CT results pending   Interventions/ Plan   CT abdomen/pelvis Reglan N.p.o.       Raenette Rover, DNP, [place]

## 2023-02-04 DIAGNOSIS — K3184 Gastroparesis: Secondary | ICD-10-CM | POA: Diagnosis not present

## 2023-02-04 DIAGNOSIS — E1110 Type 2 diabetes mellitus with ketoacidosis without coma: Secondary | ICD-10-CM | POA: Diagnosis not present

## 2023-02-04 DIAGNOSIS — E1143 Type 2 diabetes mellitus with diabetic autonomic (poly)neuropathy: Secondary | ICD-10-CM | POA: Diagnosis not present

## 2023-02-04 LAB — HEMOGLOBIN A1C
Hgb A1c MFr Bld: 13.4 % — ABNORMAL HIGH (ref 4.8–5.6)
Hgb A1c MFr Bld: 13.3 % — ABNORMAL HIGH (ref 4.8–5.6)
Mean Plasma Glucose: 338 mg/dL
Mean Plasma Glucose: 335 mg/dL

## 2023-02-04 LAB — GLUCOSE, CAPILLARY
Glucose-Capillary: 223 mg/dL — ABNORMAL HIGH (ref 70–99)
Glucose-Capillary: 181 mg/dL — ABNORMAL HIGH (ref 70–99)
Glucose-Capillary: 146 mg/dL — ABNORMAL HIGH (ref 70–99)
Glucose-Capillary: 143 mg/dL — ABNORMAL HIGH (ref 70–99)
Glucose-Capillary: 210 mg/dL — ABNORMAL HIGH (ref 70–99)

## 2023-02-04 LAB — COMPREHENSIVE METABOLIC PANEL
ALT: 13 U/L (ref 0–44)
AST: 14 U/L — ABNORMAL LOW (ref 15–41)
Albumin: 3.3 g/dL — ABNORMAL LOW (ref 3.5–5.0)
Alkaline Phosphatase: 60 U/L (ref 38–126)
Anion gap: 11 (ref 5–15)
BUN: 12 mg/dL (ref 6–20)
CO2: 18 mmol/L — ABNORMAL LOW (ref 22–32)
Calcium: 8.4 mg/dL — ABNORMAL LOW (ref 8.9–10.3)
Chloride: 103 mmol/L (ref 98–111)
Creatinine, Ser: 0.54 mg/dL (ref 0.44–1.00)
GFR, Estimated: >60 mL/min (ref >60)
Glucose, Bld: 203 mg/dL — ABNORMAL HIGH (ref 70–99)
Potassium: 3.9 mmol/L (ref 3.5–5.1)
Sodium: 132 mmol/L — ABNORMAL LOW (ref 135–145)
Total Bilirubin: 1 mg/dL (ref 0.3–1.2)
Total Protein: 6.5 g/dL (ref 6.5–8.1)

## 2023-02-04 LAB — CBC
HCT: 43.1 % (ref 36.0–46.0)
Hemoglobin: 13.9 g/dL (ref 12.0–15.0)
MCH: 28.7 pg (ref 26.0–34.0)
MCHC: 32.3 g/dL (ref 30.0–36.0)
MCV: 89 fL (ref 80.0–100.0)
Platelets: 286 10*3/uL (ref 150–400)
RBC: 4.84 MIL/uL (ref 3.87–5.11)
RDW: 15.4 % (ref 11.5–15.5)
WBC: 12.1 10*3/uL — ABNORMAL HIGH (ref 4.0–10.5)
nRBC: 0 % (ref 0.0–0.2)

## 2023-02-04 LAB — RAPID URINE DRUG SCREEN, HOSP PERFORMED
Amphetamines: NOT DETECTED
Barbiturates: NOT DETECTED
Benzodiazepines: NOT DETECTED
Cocaine: NOT DETECTED
Opiates: NOT DETECTED
Tetrahydrocannabinol: POSITIVE — AB

## 2023-02-04 LAB — T3: T3, Total: 89 ng/dL (ref 71–180)

## 2023-02-04 MED ORDER — SODIUM CHLORIDE 0.9 % IV SOLN: INTRAVENOUS | Status: DC

## 2023-02-04 NOTE — Progress Notes (Signed)
PROGRESS NOTE    [name] [name]  [id number] DOB: 1961/04/05 DOA: 02/02/2023 PCP: Rockne Menghini, NP   Brief Narrative:  [name] [name] is a 62 y.o. female with medical history significant of uncontrolled type 2 diabetes complicated by multiple hospital admissions.  Patient has had 2 admissions in January 2024 and 2 ER visits this year so far.  She had a hospital admission in [place] in January 2024. Patient had a heart cath at [facility] yesterday she was told not to take her insulin so she did not take it yesterday however she took her insulin today.  No intervention was recommended after the cath.  She denied chest pain shortness of breath fever chills cough.  She complains of nausea vomiting and diarrhea multiple times.  She denies abdominal pain.  The cath was done to the right groin where she really has no complaints of pain there.   Patient had the last hospital admission on January 08, 2023 When she was diagnosed with DKA.  At that time workup revealed a very elevated TSH of 50.  She was told to repeat her TSH in 2 weeks but there is no documentation of that in the EMR.  She reports taking all her medications as prescribed. She denies dysuria frequency. ED Course: CT of the abdomen and pelvis concerning for bladder distention and urinary retention. Blood pressure 166/104 pulse is 116 respirations 16 she is afebrile saturating 99% on room air. She was started on insulin drip. Her sodium was 138 potassium was 3.4 BUN was 21 creatinine 0.82 she had anion gap of 20. White count 13.9.  Hemoglobin 15.4.  Beta hydroxybutyrate level is 4.16.  Last hemoglobin A1c was 13 in January 2024. UA shows ketones and proteinuria. LA 2.5  Assessment & Plan:   Active Problems:   Gastroparesis due to DM ([place])   Acute urinary retention   Hypothyroidism   Hyperlipidemia   GERD   DKA (diabetic ketoacidosis) ([place])   AKI (acute kidney injury) ([place])  #1 diabetic ketoacidosis -resolved with high gap in the  setting of uncontrolled type 2 diabetes with multiple hospital admissions and ER visits -resolved. patient missed her dose of insulin prior to cath.  She was admitted with nausea and vomiting.  Full liquid diet Workup shows her UA does not reveal any evidence of UTI,  CT abdomen and pelvis shows circumferential thickening of the distal esophagus, mild gastritis or duodenitis with subsequent partial gastric outlet obstruction.  Colonic diverticulosis.     #2 hypothyroidism severe -TSH 24, T40.66.  TSH trending down.  Continue Synthroid.   #4 history of nonischemic cardiomyopathy-stable  Will restart some of her home medications.   #5 gastroparesis secondary to uncontrolled diabetes symptomatic treatment   #6  Bladder distention by CT scan patient has a pure wick in place with good urine output  #7 orthostatic hypotension on midodrine at home will restart   #8 abnormal CT scan of the abdomen and pelvis with esophageal wall thickening, gastritis, duodenitis plan for EGD tomorrow.(Holding Lovenox 02/05/2023 in case of procedure. ) N.p.o. after midnight    Estimated body mass index is 23.64 kg/m as calculated from the following:   Height as of this encounter: 4' 11.5" (1.511 m).   Weight as of this encounter: 54 kg.  DVT prophylaxis: lovenox Code Status:full Family Communication:none Disposition Plan:  Status is: Inpatient Remains inpatient appropriate because: dka vomiting   Consultants:  gi  Procedures: none Antimicrobials:none  Subjective:  C/o nausea  Pain  better  On clears  Objective: Vitals:   02/03/23 1642 02/03/23 2044 02/04/23 0038 02/04/23 0505  BP: (!) 153/88 106/70 131/72 108/72  Pulse: 96 93 98 97  Resp: '19 19  20  '$ Temp: 98 F (36.7 C) 98.1 F (36.7 C) 98.3 F (36.8 C) 98.1 F (36.7 C)  TempSrc: Oral Oral Oral Oral  SpO2: 99% 95% 98% 97%  Weight:      Height:        Intake/Output Summary (Last 24 hours) at 02/04/2023 1158 Last data filed at 02/04/2023  [id number] Gross per 24 hour  Intake 240 ml  Output 1450 ml  Net -1210 ml    Filed Weights   02/02/23 1700 02/02/23 1801 02/03/23 1212  Weight: 55.8 kg 52.2 kg 54 kg    Examination:  General exam: Appears calm and comfortable  Respiratory system: Clear to auscultation. Respiratory effort normal. Cardiovascular system: S1 & S2 heard, RRR. No JVD, murmurs, rubs, gallops or clicks. No pedal edema. Gastrointestinal system: Abdomen is nondistended, soft and tender. No organomegaly or masses felt. Normal bowel sounds heard. Central nervous system: Alert and oriented. No focal neurological deficits. Extremities: no edema Skin: No rashes, lesions or ulcers Psychiatry: Judgement and insight appear normal. Mood & affect appropriate.     Data Reviewed: I have personally reviewed following labs and imaging studies  CBC: Recent Labs  Lab 02/02/23 1450 02/02/23 1900 02/04/23 0346  WBC 13.9* 13.3* 12.1*  NEUTROABS 11.2*  --   --   HGB 15.4* 13.9 13.9  HCT 47.0* 42.7 43.1  MCV 86.4 87.7 89.0  PLT 408* 352 [id number]    Basic Metabolic Panel: Recent Labs  Lab 02/02/23 2114 02/03/23 0119 02/03/23 0536 02/03/23 0840 02/03/23 1348 02/04/23 0346  NA 138 138 131* 132*  --  132*  K 3.3* 3.1* 3.8 3.8  --  3.9  CL 101 98 99 97*  --  103  CO2 '25 27 22 23  '$ --  18*  GLUCOSE 204* 196* 213* 159*  --  203*  BUN '14 15 13 10  '$ --  12  CREATININE 0.60 0.55 0.46 0.48  --  0.54  CALCIUM 8.2* 8.6* 8.4* 8.4*  --  8.4*  MG  --   --   --   --  1.8  --     GFR: Estimated Creatinine Clearance: 57.6 mL/min (by C-G formula based on SCr of 0.54 mg/dL). Liver Function Tests: Recent Labs  Lab 02/02/23 1450 02/04/23 0346  AST 21 14*  ALT 15 13  ALKPHOS 77 60  BILITOT 0.9 1.0  PROT 8.2* 6.5  ALBUMIN 4.5 3.3*    Recent Labs  Lab 02/02/23 1450 02/03/23 1348  LIPASE 40 98*    No results for input(s): "AMMONIA" in the last 168 hours. Coagulation Profile: No results for input(s): "INR", "PROTIME" in  the last 168 hours. Cardiac Enzymes: No results for input(s): "CKTOTAL", "CKMB", "CKMBINDEX", "TROPONINI" in the last 168 hours. BNP (last 3 results) No results for input(s): "PROBNP" in the last 8760 hours. HbA1C: Recent Labs    02/02/23 1900  HGBA1C 13.4*   CBG: Recent Labs  Lab 02/03/23 1821 02/03/23 2045 02/04/23 0148 02/04/23 0735 02/04/23 1133  GLUCAP 216* 174* 223* 181* 146*    Lipid Profile: No results for input(s): "CHOL", "HDL", "LDLCALC", "TRIG", "CHOLHDL", "LDLDIRECT" in the last 72 hours. Thyroid Function Tests: Recent Labs    02/02/23 1900 02/03/23 0154  TSH 24.206*  --   FREET4  --  0.66  Anemia Panel: No results for input(s): "VITAMINB12", "FOLATE", "FERRITIN", "TIBC", "IRON", "RETICCTPCT" in the last 72 hours. Sepsis Labs: Recent Labs  Lab 02/02/23 1615 02/02/23 2111 02/03/23 1348  LATICACIDVEN 2.5* 1.7 0.9     Recent Results (from the past 240 hour(s))  Resp panel by RT-PCR (RSV, Flu A&B, Covid) Anterior Nasal Swab     Status: None   Collection Time: 02/02/23  2:41 PM   Specimen: Anterior Nasal Swab  Result Value Ref Range Status   SARS Coronavirus 2 by RT PCR NEGATIVE NEGATIVE Final    Comment: (NOTE) SARS-CoV-2 target nucleic acids are NOT DETECTED.  The SARS-CoV-2 RNA is generally detectable in upper respiratory specimens during the acute phase of infection. The lowest concentration of SARS-CoV-2 viral copies this assay can detect is 138 copies/mL. A negative result does not preclude SARS-Cov-2 infection and should not be used as the sole basis for treatment or other patient management decisions. A negative result may occur with  improper specimen collection/handling, submission of specimen other than nasopharyngeal swab, presence of viral mutation(s) within the areas targeted by this assay, and inadequate number of viral copies(<138 copies/mL). A negative result must be combined with clinical observations, patient history, and  epidemiological information. The expected result is Negative.  Fact Sheet for Patients:  EntrepreneurPulse.com.au  Fact Sheet for Healthcare Providers:  IncredibleEmployment.be  This test is no t yet approved or cleared by the [place] FDA and  has been authorized for detection and/or diagnosis of SARS-CoV-2 by FDA under an Emergency Use Authorization (EUA). This EUA will remain  in effect (meaning this test can be used) for the duration of the COVID-19 declaration under Section 564(b)(1) of the Act, 21 U.S.C.section 360bbb-3(b)(1), unless the authorization is terminated  or revoked sooner.       Influenza A by PCR NEGATIVE NEGATIVE Final   Influenza B by PCR NEGATIVE NEGATIVE Final    Comment: (NOTE) The Xpert Xpress SARS-CoV-2/FLU/RSV plus assay is intended as an aid in the diagnosis of influenza from Nasopharyngeal swab specimens and should not be used as a sole basis for treatment. Nasal washings and aspirates are unacceptable for Xpert Xpress SARS-CoV-2/FLU/RSV testing.  Fact Sheet for Patients: EntrepreneurPulse.com.au  Fact Sheet for Healthcare Providers: IncredibleEmployment.be  This test is not yet approved or cleared by the [place] FDA and has been authorized for detection and/or diagnosis of SARS-CoV-2 by FDA under an Emergency Use Authorization (EUA). This EUA will remain in effect (meaning this test can be used) for the duration of the COVID-19 declaration under Section 564(b)(1) of the Act, 21 U.S.C. section 360bbb-3(b)(1), unless the authorization is terminated or revoked.     Resp Syncytial Virus by PCR NEGATIVE NEGATIVE Final    Comment: (NOTE) Fact Sheet for Patients: EntrepreneurPulse.com.au  Fact Sheet for Healthcare Providers: IncredibleEmployment.be  This test is not yet approved or cleared by the [place] FDA and has been  authorized for detection and/or diagnosis of SARS-CoV-2 by FDA under an Emergency Use Authorization (EUA). This EUA will remain in effect (meaning this test can be used) for the duration of the COVID-19 declaration under Section 564(b)(1) of the Act, 21 U.S.C. section 360bbb-3(b)(1), unless the authorization is terminated or revoked.  Performed at [facility], [place] [address] [place], [place] [address]          Radiology Studies: DG Abd 1 View  Result Date: 02/03/2023 CLINICAL DATA:  Abdominal pain. EXAM: ABDOMEN - 1 VIEW COMPARISON:  CT earlier today. FINDINGS:  No bowel dilatation to suggest obstruction. Suspected decreased bladder distension from earlier CT. No radiopaque calculi. IMPRESSION: Nonobstructive bowel gas pattern. Suspect decreased bladder distension from earlier CT. Electronically Signed   By: Keith Rake M.D.   On: 02/03/2023 19:38   CT ABDOMEN PELVIS WO CONTRAST  Result Date: 02/03/2023 CLINICAL DATA:  Nausea, vomiting and diarrhea. EXAM: CT ABDOMEN AND PELVIS WITHOUT CONTRAST TECHNIQUE: Multidetector CT imaging of the abdomen and pelvis was performed following the standard protocol without IV contrast. RADIATION DOSE REDUCTION: This exam was performed according to the departmental dose-optimization program which includes automated exposure control, adjustment of the mA and/or kV according to patient size and/or use of iterative reconstruction technique. COMPARISON:  January 06, 2023 FINDINGS: Lower chest: No acute abnormality. Hepatobiliary: The liver is enlarged. No focal liver abnormality is seen. No gallstones, gallbladder wall thickening, or biliary dilatation. Pancreas: Unremarkable. No pancreatic ductal dilatation or surrounding inflammatory changes. Spleen: Normal in size without focal abnormality. Adrenals/Urinary Tract: Adrenal glands are unremarkable. Kidneys are normal, without renal calculi, focal lesion, or hydronephrosis. The urinary  bladder is markedly distended. Stomach/Bowel: Stable, moderate severity circumferential thickening of the visualized portion of the distal esophagus is seen. There is marked severity gastric distension. Mild thickening of the gastric antrum and duodenal bulb is also seen. The remaining large and small bowel loops within the abdomen are normal in caliber. Appendix appears normal. Noninflamed diverticula are seen throughout the large bowel. Vascular/Lymphatic: Aortic atherosclerosis. No enlarged abdominal or pelvic lymph nodes. Reproductive: Status post hysterectomy. No adnexal masses. Other: No abdominal wall hernia or abnormality. No abdominopelvic ascites. Musculoskeletal: No acute or significant osseous findings. IMPRESSION: 1. Stable circumferential thickening of the distal esophagus, which may represent sequelae associated with esophagitis. 2. Findings which may represent mild gastritis and/or duodenitis with subsequent partial gastric outlet obstruction. 3. Colonic diverticulosis. 4. Markedly distended urinary bladder. 5. Aortic atherosclerosis. Aortic Atherosclerosis (ICD10-I70.0). Electronically Signed   By: [name] M.D.   On: 02/03/2023 04:17      Scheduled Meds:  enoxaparin (LOVENOX) injection  40 mg Subcutaneous Q24H   insulin aspart  0-9 Units Subcutaneous TID WC   insulin glargine-yfgn  4 Units Subcutaneous Q24H   pantoprazole (PROTONIX) IV  40 mg Intravenous Q12H   Continuous Infusions:  lactated ringers       LOS: 2 days    Time spent: 59 MIN  Georgette Shell, MD 02/04/2023, 11:58 AM

## 2023-02-04 NOTE — Consult Note (Addendum)
Consultation  Referring Provider: Dr. Zigmund Daniel     Primary Care Physician:  Rockne Menghini, NP Primary Gastroenterologist: Dr. Ardis Hughs     Reason for Consultation: Abnormal CT of the esophagus and nausea/vomiting            HPI:   [name] [name] is a 62 y.o. female with a past medical history significant for uncontrolled type 2 diabetes complicated by multiple hospital admissions whom we are consulted on regards to an abnormal CT of the esophagus and nausea/vomiting.    Patient had a hospital admission in [place] in January 2024 and apparently a heart cath at [facility] on 02/01/2023.  She continues to complain of nausea vomiting and diarrhea multiple times.  No abdominal pain.    Also admitted January 08, 2023 and she was diagnosed with DKA, at that time workup revealed a very elevated TSH of 50, told to repeat her TSH in 2 weeks but there is no documentation of that being done.    Today, the patient tells me that she has been having some trouble with epigastric pain and wonders if her ulcers are back.  Anytime she eats she feels irritated just at the top of her stomach.  She has been taking her Pantoprazole 40 mg twice daily that tells me that insurance is giving her trouble over it and does not want to pay for it anymore.  Also describes her history of gastroparesis and has apparently run out of her Reglan recently so after she eats a meal stays full for a couple of days.  Previously 5 mg worked well for her 4 times daily.      Most recently she went to have heart cath at [facility] on 02/01/2023 and the day before and that day she was told to hold her insulin.  Friday upon returning home from the heart cath she became very nauseous and just felt sick so she went to lay in her bed.  When she woke up she still felt sick and took a Phenergan and went back to sleep.  When she woke up on Saturday she thought she felt better and so she tried to drink some water and Sprite 0 and started vomiting ferocious  sleep.  This continued and she had her husband bring her juice to the ED.  Does describe a small amount of bright red blood in her vomitus on Saturday.  Symptoms have been mostly controlled since she has been in the hospital.  She tolerated a clear liquid diet last night.    Patient does have some complaints about seeing Dr. Ardis Hughs in our office last and was planning on switching her care to a different physician in our office.    Denies fever, chills, weight loss or blood in her stool.  ED course: CTAP without contrast showed stable circumferential thickening of the distal esophagus, which may represent sequela associated esophagitis, findings may represent mild gastritis and/or duodenitis with subsequent partial gastric outlet obstruction, markedly distended urinary bladder  GI history: 04/30/2022 office visit with Dr. Ardis Hughs, outlined problems/GI history as below with discussion that her nausea and vomiting is causing severe esophagitis, ulcerative.  She was not on a PPI she was supposed to be.  Also discussed chronic THC use and how could contribute to nausea and vomiting.  Refilled her Reglan 5 mg 4 times daily. 1.  Dysphasia.  Led to EGD Dr. Ardis Hughs February 2018 Schatzki's ring was noted and dilated up to 20 mm. 2.  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  Gastric emptying scan April 2018 was normal however.  Has frequent episodes of DKA.  During 1 episode of DKA while hospitalized December 2019 she underwent a CT scan of her abdomen pelvis with IV and oral contrast for "abdominal pain with nausea and vomiting" it was normal. 3. Marijuana smoker (may contribute to #2)` 4.  Routine risk for colon cancer.  05/2019 colonoscopy Dr. Ardis Hughs found diverticulosis but was otherwise normal. 5.  Severe esophagitis, duodenal ulcers found on small bowel endoscopy while admitted 05/2019 for nausea, vomiting, DKA.  Biopsies suggested no H. pylori infection.  Very poorly  controlled diabetes hemoglobin A1c around 11.  Her PPI was doubled.  EGD 08/2020 during another admission for DKA, imaging showing abnormal esophagus showed persistent severe ulcerative esophagitis.  Past Medical History:  Diagnosis Date   Allergy    seasonal   Anxiety    B12 deficiency    Chest pain 04/05/2017   CHF (congestive heart failure) ([place])    Diabetes mellitus type 1, uncontrolled    "dr. 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) 10/29/2013   Leg cramping    "@ night" (09/18/2013)   Migraine    "once q couple months" (11/26/2018)   Osteoporosis    Peripheral neuropathy    Umbilical hernia    unrepaired (09/18/2013)    Past Surgical History:  Procedure Laterality Date   BIOPSY  06/03/2019   Procedure: BIOPSY;  Surgeon: Jackquline Denmark, MD;  Location: [facility];  Service: Endoscopy;;   BIOPSY  08/17/2020   Procedure: BIOPSY;  Surgeon: Thornton Park, MD;  Location: [facility];  Service: Gastroenterology;;   [place]   ENTEROSCOPY N/A 06/03/2019   Procedure: ENTEROSCOPY;  Surgeon: Jackquline Denmark, MD;  Location: [facility];  Service: Endoscopy;  Laterality: N/A;   ESOPHAGOGASTRODUODENOSCOPY (EGD) WITH PROPOFOL N/A 08/17/2020   Procedure: ESOPHAGOGASTRODUODENOSCOPY (EGD) WITH PROPOFOL;  Surgeon: Thornton Park, MD;  Location: [facility];  Service: Gastroenterology;  Laterality: N/A;   LAPAROSCOPIC ASSISTED VAGINAL HYSTERECTOMY  09/23/2012   Procedure: LAPAROSCOPIC ASSISTED VAGINAL HYSTERECTOMY;  Surgeon: Linda Hedges, DO;  Location: [facility];  Service: Gynecology;  Laterality: N/A;  pull Dr Gregor Hams instrument   LEFT HEART CATH AND CORONARY ANGIOGRAPHY N/A 02/11/2017   Procedure: Left Heart Cath and  Coronary Angiography;  Surgeon: Lorretta Harp, MD;  Location: [facility];  Service: Cardiovascular;  Laterality: N/A;   TUBAL LIGATION Bilateral 1992    Family History  Problem Relation Age of Onset   Diabetes Other    Heart disease Other    Heart failure Mother 28       Her heart skipped a beat and stopped - per pt   Diabetes Maternal Grandmother    Alzheimer's disease Maternal Grandmother    Coronary artery disease Maternal Grandmother    Heart attack Maternal Grandfather    Heart failure Brother    Diabetes Brother    Colon polyps Neg Hx    Esophageal cancer Neg Hx    Liver cancer Neg Hx    Pancreatic cancer Neg Hx    Stomach cancer Neg Hx    Crohn's disease Neg  Hx    Rectal cancer Neg Hx     Social History   Tobacco Use   Smoking status: Former    Packs/day: 0.12    Years: 10.00    Total pack years: 1.20    Types: Cigarettes    Quit date: 12/10/1990    Years since quitting: 32.1   Smokeless tobacco: Never  Vaping Use   Vaping Use: Never used  Substance Use Topics   Alcohol use: Never    Alcohol/week: 0.0 standard drinks of alcohol   Drug use: Not Currently    Types: Marijuana    Comment: last use 2 days ago    Prior to Admission medications   Medication Sig Start Date End Date Taking? Authorizing Provider  aspirin EC 81 MG tablet Take 81 mg by mouth daily. Swallow whole.   Yes [provider]  atorvastatin (LIPITOR) 80 MG tablet Take 1 tablet (80 mg total) by mouth daily. 01/12/22  Yes Bedsole, Amy E, MD  eletriptan (RELPAX) 40 MG tablet Take 1 tablet (40 mg total) by mouth as needed for migraine or headache. May repeat in 2 hours if headache persists or recurs. Patient taking differently: Take 40 mg by mouth every 2 (two) hours as needed for migraine or headache. 08/14/22  Yes Bedsole, Amy E, MD  insulin lispro (HUMALOG KWIKPEN) 100 UNIT/ML KwikPen INJECT INTO SKIN 3 TIMES A DAY AT MEAL TIME ACCORDING TO THE FOLLOWING SLIDING SCALE: 27 UNITS MAX  PER DAY DX E11.43 Patient taking differently: Inject 1-4 Units into the skin with breakfast, with lunch, and with evening meal. Per sliding scale. 02/26/22  Yes Bedsole, Amy E, MD  levothyroxine (SYNTHROID) 137 MCG tablet Take 959 mcg by mouth every Sunday. 03/19/22  Yes [provider]  midodrine (PROAMATINE) 5 MG tablet Take 1 tablet (5 mg total) by mouth 2 (two) times daily with a meal. 03/02/22  Yes Minus Breeding, MD  oxybutynin (DITROPAN) 5 MG tablet Take 1 tablet (5 mg total) by mouth every 8 (eight) hours as needed for bladder spasms. 01/08/23  Yes Danford, Suann Larry, MD  promethazine (PHENERGAN) 25 MG tablet Take 1 tablet (25 mg total) by mouth every 8 (eight) hours as needed for nausea or vomiting. 01/05/22  Yes Bedsole, Amy E, MD  TOUJEO SOLOSTAR 300 UNIT/ML Solostar Pen Inject 6 Units into the skin at bedtime. 03/29/22  Yes Regalado, Belkys A, MD  traZODone (DESYREL) 50 MG tablet Take 100 mg by mouth at bedtime as needed for sleep. 02/01/23  Yes [provider]  Continuous Blood Gluc Sensor (DEXCOM G6 SENSOR) MISC INJECT 1 SENSOR INTO SKIN EVERY 10 DAYS 07/18/22   Bedsole, Amy E, MD  Continuous Blood Gluc Transmit (DEXCOM G6 TRANSMITTER) MISC USE AS DIRECTED FOR CONTINUOUS GLUCOSE MONITORING. REUSE TRANSMITTER X 90DAYS THEN DISCARD & REPLACE 07/05/21   Bedsole, Amy E, MD  Insulin Pen Needle (PEN NEEDLES) 31G X 8 MM MISC Use to inject insulin 4 times a day.  Dx: E11.10 07/21/21   Jinny Sanders, MD  Lancets (ONETOUCH ULTRASOFT) lancets Use to check blood sugar daily as needed.  Dx: E11.10 12/23/17   Jinny Sanders, MD    Current Facility-Administered Medications  Medication Dose Route Frequency Provider Last Rate Last Admin   acetaminophen (TYLENOL) tablet 650 mg  650 mg Oral Q6H PRN Raenette Rover, NP   650 mg at 02/03/23 0315   dextrose 50 % solution 0-50 mL  0-50 mL Intravenous PRN Georgette Shell, MD  enoxaparin (LOVENOX) injection 40 mg  40 mg Subcutaneous Q24H  Georgette Shell, MD   40 mg at 02/03/23 1838   insulin aspart (novoLOG) injection 0-9 Units  0-9 Units Subcutaneous TID WC Raenette Rover, NP   2 Units at 02/04/23 0858   insulin glargine-yfgn (SEMGLEE) injection 4 Units  4 Units Subcutaneous Q24H Raenette Rover, NP   4 Units at 02/04/23 0148   lactated ringers bolus 1,116 mL  20 mL/kg Intravenous Once Georgette Shell, MD       metoprolol tartrate (LOPRESSOR) injection 5 mg  5 mg Intravenous Q6H PRN Georgette Shell, MD       ondansetron [facility]) injection 4 mg  4 mg Intravenous Q6H PRN Raenette Rover, NP   4 mg at 02/03/23 1340   pantoprazole (PROTONIX) injection 40 mg  40 mg Intravenous Q12H Georgette Shell, MD   40 mg at 02/04/23 [id number]    Allergies as of 02/02/2023 - Review Complete 02/02/2023  Allergen Reaction Noted   Codeine Hives, Anxiety, and Other (See Comments)      Review of Systems:    Constitutional: No weight loss, fever or chills Skin: No rash  Cardiovascular: No chest pain Respiratory: No SOB  Gastrointestinal: See HPI and otherwise negative Genitourinary: No dysuria  Neurological: No headache, dizziness or syncope Musculoskeletal: No new muscle or joint pain Hematologic: No bruising Psychiatric: No history of depression or anxiety    Physical Exam:  Vital signs in last 24 hours: Temp:  [98 F (36.7 C)-98.3 F (36.8 C)] 98.1 F (36.7 C) (02/26 0505) Pulse Rate:  [93-103] 97 (02/26 0505) Resp:  [18-20] 20 (02/26 0505) BP: (106-153)/(70-88) 108/72 (02/26 0505) SpO2:  [95 %-100 %] 97 % (02/26 0505) Weight:  [54 kg] 54 kg (02/25 1212) Last BM Date : 02/02/23 General:   Pleasant Caucasian female appears to be in NAD, Well developed, Well nourished, alert and cooperative Head:  Normocephalic and atraumatic. Eyes:   PEERL, EOMI. No icterus. Conjunctiva pink. Ears:  Normal auditory acuity. Neck:  Supple Throat: Oral cavity and pharynx without inflammation, swelling or lesion.  Lungs:  Respirations even and unlabored. Lungs clear to auscultation bilaterally.   No wheezes, crackles, or rhonchi.  Heart: Normal S1, S2. No MRG. Regular rate and rhythm. No peripheral edema, cyanosis or pallor.  Abdomen:  Soft, nondistended, mild-mod epigastric ttp. No rebound or guarding. Normal bowel sounds. No appreciable masses or hepatomegaly. Rectal:  Not performed.  Msk:  Symmetrical without gross deformities. Peripheral pulses intact.  Extremities:  Without edema, no deformity or joint abnormality.  Neurologic:  Alert and  oriented x4;  grossly normal neurologically.  Skin:   Dry and intact without significant lesions or rashes. Psychiatric: Demonstrates good judgement and reason without abnormal affect or behaviors.   LAB RESULTS: Recent Labs    02/02/23 1450 02/02/23 1900 02/04/23 0346  WBC 13.9* 13.3* 12.1*  HGB 15.4* 13.9 13.9  HCT 47.0* 42.7 43.1  PLT 408* 352 286   BMET Recent Labs    02/03/23 0536 02/03/23 0840 02/04/23 0346  NA 131* 132* 132*  K 3.8 3.8 3.9  CL 99 97* 103  CO2 22 23 18*  GLUCOSE 213* 159* 203*  BUN '13 10 12  '$ CREATININE 0.46 0.48 0.54  CALCIUM 8.4* 8.4* 8.4*   LFT Recent Labs    02/04/23 0346  PROT 6.5  ALBUMIN 3.3*  AST 14*  ALT 13  ALKPHOS 60  BILITOT 1.0   STUDIES: DG Abd 1 View  Result Date: 02/03/2023 CLINICAL DATA:  Abdominal pain. EXAM: ABDOMEN - 1 VIEW COMPARISON:  CT earlier today. FINDINGS: No bowel dilatation to suggest obstruction. Suspected decreased bladder distension from earlier CT. No radiopaque calculi. IMPRESSION: Nonobstructive bowel gas pattern. Suspect decreased bladder distension from earlier CT. Electronically Signed   By: Keith Rake M.D.   On: 02/03/2023 19:38   CT ABDOMEN PELVIS WO CONTRAST  Result Date: 02/03/2023 CLINICAL DATA:  Nausea, vomiting and diarrhea. EXAM: CT ABDOMEN AND PELVIS WITHOUT CONTRAST TECHNIQUE: Multidetector CT imaging of the abdomen and pelvis was performed following the standard  protocol without IV contrast. RADIATION DOSE REDUCTION: This exam was performed according to the departmental dose-optimization program which includes automated exposure control, adjustment of the mA and/or kV according to patient size and/or use of iterative reconstruction technique. COMPARISON:  January 06, 2023 FINDINGS: Lower chest: No acute abnormality. Hepatobiliary: The liver is enlarged. No focal liver abnormality is seen. No gallstones, gallbladder wall thickening, or biliary dilatation. Pancreas: Unremarkable. No pancreatic ductal dilatation or surrounding inflammatory changes. Spleen: Normal in size without focal abnormality. Adrenals/Urinary Tract: Adrenal glands are unremarkable. Kidneys are normal, without renal calculi, focal lesion, or hydronephrosis. The urinary bladder is markedly distended. Stomach/Bowel: Stable, moderate severity circumferential thickening of the visualized portion of the distal esophagus is seen. There is marked severity gastric distension. Mild thickening of the gastric antrum and duodenal bulb is also seen. The remaining large and small bowel loops within the abdomen are normal in caliber. Appendix appears normal. Noninflamed diverticula are seen throughout the large bowel. Vascular/Lymphatic: Aortic atherosclerosis. No enlarged abdominal or pelvic lymph nodes. Reproductive: Status post hysterectomy. No adnexal masses. Other: No abdominal wall hernia or abnormality. No abdominopelvic ascites. Musculoskeletal: No acute or significant osseous findings. IMPRESSION: 1. Stable circumferential thickening of the distal esophagus, which may represent sequelae associated with esophagitis. 2. Findings which may represent mild gastritis and/or duodenitis with subsequent partial gastric outlet obstruction. 3. Colonic diverticulosis. 4. Markedly distended urinary bladder. 5. Aortic atherosclerosis. Aortic Atherosclerosis (ICD10-I70.0). Electronically Signed   By: [name] M.D.    On: 02/03/2023 04:17     Impression / Plan:   Impression: 1.  DKA: With nausea and vomiting after holding her insulin for a heart cath, hospitalist team is managing 2.  Hypothyroidism 3.  History of nonischemic cardiomyopathy 4.  Gastroparesis secondary to uncontrolled diabetes: Typically controlled on Reglan 5 mg 4 times daily 5.  Abnormal CT of the abdomen/esophagus: Stable appearance of likely erosive esophagitis as seen previously on EGDs the last in 2021, patient does describe a small amount of hematemesis while vomiting yesterday, none since, also question of gastric outlet obstruction but tolerating a clear liquid diet perfectly fine overnight; likely erosive esophagitis  Plan: 1.  Did discuss with patient that we could do an EGD outpatient given that this is likely her known esophagitis but she is very worried about this ongoing epigastric pain and question of ulcers and also the fact that she saw small amount of blood in her vomitus.  We will go ahead and schedule her for an EGD tomorrow with Dr. Fuller Plan.  Did discuss risks, benefits, limitations and alternatives the patient and she agrees to proceed. 2.  For now continue Pantoprazole 40 mg IV twice daily 3.  Continue antiemetics as needed 4.  Most importantly need to get control of her diabetes 5.  Can resume Reglan 5 mg 4 times daily at discharge and continue this as outpatient, will need follow-up in our office  for further refills 6.  Pending results from EGD patient would likely benefit from Carafate 7.  Changed to a full liquid diet and n.p.o. at midnight.  Thank you for your kind consultation, we will continue to follow.  Lavone Nian [facility]  02/04/2023, 9:21 AM   Attending Physician Note   I have taken a history, reviewed the chart and examined the patient. I performed a substantive portion of this encounter, including complete performance of at least one of the key components, in conjunction with the APP. I agree with the  APP's note, impression and recommendations with my edits. My additional impressions and recommendations are as follows.   Gastroparesis, DKA, DM with recurrent nausea, vomiting and small volume hematemesis   History of severe esophagitis and duodenal ulcers   Abnormal CT of the esophagus likely secondary to erosive esophagitis  EGD tomorrow Continue pantoprazole 40 mg IV twice daily Resume Reglan 5 mg before meals and at bedtime Advance to full liquids Improved control of DM   Lucio Edward, MD [facility] See [facility], [place] [facility], for our on call provider

## 2023-02-04 NOTE — Inpatient Diabetes Management (Signed)
Inpatient Diabetes Program Recommendations  AACE/ADA: New Consensus Statement on Inpatient Glycemic Control (2015)  Target Ranges:  Prepandial:   less than 140 mg/dL      Peak postprandial:   less than 180 mg/dL (1-2 hours)      Critically ill patients:  140 - 180 mg/dL   Lab Results  Component Value Date   GLUCAP 146 (H) 02/04/2023   HGBA1C 13.4 (H) 02/02/2023    Review of Glycemic Control  Diabetes history: DM2 Outpatient Diabetes medications: Toujeo 6 units QHS, Humalog 1-4 TID Current orders for Inpatient glycemic control: Semglee 4 units Q24H, Novolog 0-9 units TID  HgbA1C - 13.4% 181, 146. On [place] diet Endo - Dr Hartford Poli. Has appt on 02/07/23  Inpatient Diabetes Program Recommendations:    Agree with orders.   Spoke with pt at bedside regarding her HgbA1C of 13.4%. Pt states her insulins have increased in price and is having hard time affording. Has been trying to make her insulin "stretch" by not taking as much as ordered. Will contact pharmacy to find out which basal and bolus insulin on pt's formulary are affordable for pt.  States she monitors blood sugars at home.   Long discussion regarding HgbA1C and reducing risks of both long and short-term complications by controlling blood sugars. Also discussed hypoglycemia s/s and treatment.   Pt appreciative of information.  Will f/u in am.   Thank you. Lorenda Peck, RD, LDN, [place] Inpatient Diabetes Coordinator [phone]

## 2023-02-04 NOTE — H&P (View-Only) (Signed)
Consultation  Referring Provider: Dr. Zigmund Daniel     Primary Care Physician:  Rockne Menghini, NP Primary Gastroenterologist: Dr. Ardis Hughs     Reason for Consultation: Abnormal CT of the esophagus and nausea/vomiting            HPI:   [name] [name] is a 62 y.o. female with a past medical history significant for uncontrolled type 2 diabetes complicated by multiple hospital admissions whom we are consulted on regards to an abnormal CT of the esophagus and nausea/vomiting.    Patient had a hospital admission in [place] in January 2024 and apparently a heart cath at [facility] on 02/01/2023.  She continues to complain of nausea vomiting and diarrhea multiple times.  No abdominal pain.    Also admitted January 08, 2023 and she was diagnosed with DKA, at that time workup revealed a very elevated TSH of 50, told to repeat her TSH in 2 weeks but there is no documentation of that being done.    Today, the patient tells me that she has been having some trouble with epigastric pain and wonders if her ulcers are back.  Anytime she eats she feels irritated just at the top of her stomach.  She has been taking her Pantoprazole 40 mg twice daily that tells me that insurance is giving her trouble over it and does not want to pay for it anymore.  Also describes her history of gastroparesis and has apparently run out of her Reglan recently so after she eats a meal stays full for a couple of days.  Previously 5 mg worked well for her 4 times daily.      Most recently she went to have heart cath at [facility] on 02/01/2023 and the day before and that day she was told to hold her insulin.  Friday upon returning home from the heart cath she became very nauseous and just felt sick so she went to lay in her bed.  When she woke up she still felt sick and took a Phenergan and went back to sleep.  When she woke up on Saturday she thought she felt better and so she tried to drink some water and Sprite 0 and started vomiting ferocious  sleep.  This continued and she had her husband bring her juice to the ED.  Does describe a small amount of bright red blood in her vomitus on Saturday.  Symptoms have been mostly controlled since she has been in the hospital.  She tolerated a clear liquid diet last night.    Patient does have some complaints about seeing Dr. Ardis Hughs in our office last and was planning on switching her care to a different physician in our office.    Denies fever, chills, weight loss or blood in her stool.  ED course: CTAP without contrast showed stable circumferential thickening of the distal esophagus, which may represent sequela associated esophagitis, findings may represent mild gastritis and/or duodenitis with subsequent partial gastric outlet obstruction, markedly distended urinary bladder  GI history: 04/30/2022 office visit with Dr. Ardis Hughs, outlined problems/GI history as below with discussion that her nausea and vomiting is causing severe esophagitis, ulcerative.  She was not on a PPI she was supposed to be.  Also discussed chronic THC use and how could contribute to nausea and vomiting.  Refilled her Reglan 5 mg 4 times daily. 1.  Dysphasia.  Led to EGD Dr. Ardis Hughs February 2018 Schatzki's ring was noted and dilated up to 20 mm. 2.  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  Gastric emptying scan April 2018 was normal however.  Has frequent episodes of DKA.  During 1 episode of DKA while hospitalized December 2019 she underwent a CT scan of her abdomen pelvis with IV and oral contrast for "abdominal pain with nausea and vomiting" it was normal. 3. Marijuana smoker (may contribute to #2)` 4.  Routine risk for colon cancer.  05/2019 colonoscopy Dr. Ardis Hughs found diverticulosis but was otherwise normal. 5.  Severe esophagitis, duodenal ulcers found on small bowel endoscopy while admitted 05/2019 for nausea, vomiting, DKA.  Biopsies suggested no H. pylori infection.  Very poorly  controlled diabetes hemoglobin A1c around 11.  Her PPI was doubled.  EGD 08/2020 during another admission for DKA, imaging showing abnormal esophagus showed persistent severe ulcerative esophagitis.  Past Medical History:  Diagnosis Date   Allergy    seasonal   Anxiety    B12 deficiency    Chest pain 04/05/2017   CHF (congestive heart failure) ([place])    Diabetes mellitus type 1, uncontrolled    "dr. 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) 10/29/2013   Leg cramping    "@ night" (09/18/2013)   Migraine    "once q couple months" (11/26/2018)   Osteoporosis    Peripheral neuropathy    Umbilical hernia    unrepaired (09/18/2013)    Past Surgical History:  Procedure Laterality Date   BIOPSY  06/03/2019   Procedure: BIOPSY;  Surgeon: Jackquline Denmark, MD;  Location: [facility];  Service: Endoscopy;;   BIOPSY  08/17/2020   Procedure: BIOPSY;  Surgeon: Thornton Park, MD;  Location: [facility];  Service: Gastroenterology;;   [place]   ENTEROSCOPY N/A 06/03/2019   Procedure: ENTEROSCOPY;  Surgeon: Jackquline Denmark, MD;  Location: [facility];  Service: Endoscopy;  Laterality: N/A;   ESOPHAGOGASTRODUODENOSCOPY (EGD) WITH PROPOFOL N/A 08/17/2020   Procedure: ESOPHAGOGASTRODUODENOSCOPY (EGD) WITH PROPOFOL;  Surgeon: Thornton Park, MD;  Location: [facility];  Service: Gastroenterology;  Laterality: N/A;   LAPAROSCOPIC ASSISTED VAGINAL HYSTERECTOMY  09/23/2012   Procedure: LAPAROSCOPIC ASSISTED VAGINAL HYSTERECTOMY;  Surgeon: Linda Hedges, DO;  Location: [facility];  Service: Gynecology;  Laterality: N/A;  pull Dr Gregor Hams instrument   LEFT HEART CATH AND CORONARY ANGIOGRAPHY N/A 02/11/2017   Procedure: Left Heart Cath and  Coronary Angiography;  Surgeon: Lorretta Harp, MD;  Location: [place] [facility];  Service: Cardiovascular;  Laterality: N/A;   TUBAL LIGATION Bilateral 1992    Family History  Problem Relation Age of Onset   Diabetes Other    Heart disease Other    Heart failure Mother 109       Her heart skipped a beat and stopped - per pt   Diabetes Maternal Grandmother    Alzheimer's disease Maternal Grandmother    Coronary artery disease Maternal Grandmother    Heart attack Maternal Grandfather    Heart failure Brother    Diabetes Brother    Colon polyps Neg Hx    Esophageal cancer Neg Hx    Liver cancer Neg Hx    Pancreatic cancer Neg Hx    Stomach cancer Neg Hx    Crohn's disease Neg  Hx    Rectal cancer Neg Hx     Social History   Tobacco Use   Smoking status: Former    Packs/day: 0.12    Years: 10.00    Total pack years: 1.20    Types: Cigarettes    Quit date: 12/10/1990    Years since quitting: 32.1   Smokeless tobacco: Never  Vaping Use   Vaping Use: Never used  Substance Use Topics   Alcohol use: Never    Alcohol/week: 0.0 standard drinks of alcohol   Drug use: Not Currently    Types: Marijuana    Comment: last use 2 days ago    Prior to Admission medications   Medication Sig Start Date End Date Taking? Authorizing Provider  aspirin EC 81 MG tablet Take 81 mg by mouth daily. Swallow whole.   Yes [provider]  atorvastatin (LIPITOR) 80 MG tablet Take 1 tablet (80 mg total) by mouth daily. 01/12/22  Yes Bedsole, Amy E, MD  eletriptan (RELPAX) 40 MG tablet Take 1 tablet (40 mg total) by mouth as needed for migraine or headache. May repeat in 2 hours if headache persists or recurs. Patient taking differently: Take 40 mg by mouth every 2 (two) hours as needed for migraine or headache. 08/14/22  Yes Bedsole, Amy E, MD  insulin lispro (HUMALOG KWIKPEN) 100 UNIT/ML KwikPen INJECT INTO SKIN 3 TIMES A DAY AT MEAL TIME ACCORDING TO THE FOLLOWING SLIDING SCALE: 27 UNITS MAX  PER DAY DX E11.43 Patient taking differently: Inject 1-4 Units into the skin with breakfast, with lunch, and with evening meal. Per sliding scale. 02/26/22  Yes Bedsole, Amy E, MD  levothyroxine (SYNTHROID) 137 MCG tablet Take 959 mcg by mouth every Sunday. 03/19/22  Yes [provider]  midodrine (PROAMATINE) 5 MG tablet Take 1 tablet (5 mg total) by mouth 2 (two) times daily with a meal. 03/02/22  Yes Minus Breeding, MD  oxybutynin (DITROPAN) 5 MG tablet Take 1 tablet (5 mg total) by mouth every 8 (eight) hours as needed for bladder spasms. 01/08/23  Yes Danford, Suann Larry, MD  promethazine (PHENERGAN) 25 MG tablet Take 1 tablet (25 mg total) by mouth every 8 (eight) hours as needed for nausea or vomiting. 01/05/22  Yes Bedsole, Amy E, MD  TOUJEO SOLOSTAR 300 UNIT/ML Solostar Pen Inject 6 Units into the skin at bedtime. 03/29/22  Yes Regalado, Belkys A, MD  traZODone (DESYREL) 50 MG tablet Take 100 mg by mouth at bedtime as needed for sleep. 02/01/23  Yes [provider]  Continuous Blood Gluc Sensor (DEXCOM G6 SENSOR) MISC INJECT 1 SENSOR INTO SKIN EVERY 10 DAYS 07/18/22   Bedsole, Amy E, MD  Continuous Blood Gluc Transmit (DEXCOM G6 TRANSMITTER) MISC USE AS DIRECTED FOR CONTINUOUS GLUCOSE MONITORING. REUSE TRANSMITTER X 90DAYS THEN DISCARD & REPLACE 07/05/21   Bedsole, Amy E, MD  Insulin Pen Needle (PEN NEEDLES) 31G X 8 MM MISC Use to inject insulin 4 times a day.  Dx: E11.10 07/21/21   Jinny Sanders, MD  Lancets (ONETOUCH ULTRASOFT) lancets Use to check blood sugar daily as needed.  Dx: E11.10 12/23/17   Jinny Sanders, MD    Current Facility-Administered Medications  Medication Dose Route Frequency Provider Last Rate Last Admin   acetaminophen (TYLENOL) tablet 650 mg  650 mg Oral Q6H PRN Raenette Rover, NP   650 mg at 02/03/23 0315   dextrose 50 % solution 0-50 mL  0-50 mL Intravenous PRN Georgette Shell, MD  enoxaparin (LOVENOX) injection 40 mg  40 mg Subcutaneous Q24H  Georgette Shell, MD   40 mg at 02/03/23 1838   insulin aspart (novoLOG) injection 0-9 Units  0-9 Units Subcutaneous TID WC Raenette Rover, NP   2 Units at 02/04/23 0858   insulin glargine-yfgn (SEMGLEE) injection 4 Units  4 Units Subcutaneous Q24H Raenette Rover, NP   4 Units at 02/04/23 0148   lactated ringers bolus 1,116 mL  20 mL/kg Intravenous Once Georgette Shell, MD       metoprolol tartrate (LOPRESSOR) injection 5 mg  5 mg Intravenous Q6H PRN Georgette Shell, MD       ondansetron [facility]) injection 4 mg  4 mg Intravenous Q6H PRN Raenette Rover, NP   4 mg at 02/03/23 1340   pantoprazole (PROTONIX) injection 40 mg  40 mg Intravenous Q12H Georgette Shell, MD   40 mg at 02/04/23 [id number]    Allergies as of 02/02/2023 - Review Complete 02/02/2023  Allergen Reaction Noted   Codeine Hives, Anxiety, and Other (See Comments)      Review of Systems:    Constitutional: No weight loss, fever or chills Skin: No rash  Cardiovascular: No chest pain Respiratory: No SOB  Gastrointestinal: See HPI and otherwise negative Genitourinary: No dysuria  Neurological: No headache, dizziness or syncope Musculoskeletal: No new muscle or joint pain Hematologic: No bruising Psychiatric: No history of depression or anxiety    Physical Exam:  Vital signs in last 24 hours: Temp:  [98 F (36.7 C)-98.3 F (36.8 C)] 98.1 F (36.7 C) (02/26 0505) Pulse Rate:  [93-103] 97 (02/26 0505) Resp:  [18-20] 20 (02/26 0505) BP: (106-153)/(70-88) 108/72 (02/26 0505) SpO2:  [95 %-100 %] 97 % (02/26 0505) Weight:  [54 kg] 54 kg (02/25 1212) Last BM Date : 02/02/23 General:   Pleasant Caucasian female appears to be in NAD, Well developed, Well nourished, alert and cooperative Head:  Normocephalic and atraumatic. Eyes:   PEERL, EOMI. No icterus. Conjunctiva pink. Ears:  Normal auditory acuity. Neck:  Supple Throat: Oral cavity and pharynx without inflammation, swelling or lesion.  Lungs:  Respirations even and unlabored. Lungs clear to auscultation bilaterally.   No wheezes, crackles, or rhonchi.  Heart: Normal S1, S2. No MRG. Regular rate and rhythm. No peripheral edema, cyanosis or pallor.  Abdomen:  Soft, nondistended, mild-mod epigastric ttp. No rebound or guarding. Normal bowel sounds. No appreciable masses or hepatomegaly. Rectal:  Not performed.  Msk:  Symmetrical without gross deformities. Peripheral pulses intact.  Extremities:  Without edema, no deformity or joint abnormality.  Neurologic:  Alert and  oriented x4;  grossly normal neurologically.  Skin:   Dry and intact without significant lesions or rashes. Psychiatric: Demonstrates good judgement and reason without abnormal affect or behaviors.   LAB RESULTS: Recent Labs    02/02/23 1450 02/02/23 1900 02/04/23 0346  WBC 13.9* 13.3* 12.1*  HGB 15.4* 13.9 13.9  HCT 47.0* 42.7 43.1  PLT 408* 352 286   BMET Recent Labs    02/03/23 0536 02/03/23 0840 02/04/23 0346  NA 131* 132* 132*  K 3.8 3.8 3.9  CL 99 97* 103  CO2 22 23 18*  GLUCOSE 213* 159* 203*  BUN '13 10 12  '$ CREATININE 0.46 0.48 0.54  CALCIUM 8.4* 8.4* 8.4*   LFT Recent Labs    02/04/23 0346  PROT 6.5  ALBUMIN 3.3*  AST 14*  ALT 13  ALKPHOS 60  BILITOT 1.0   STUDIES: DG Abd 1 View  Result Date: 02/03/2023 CLINICAL DATA:  Abdominal pain. EXAM: ABDOMEN - 1 VIEW COMPARISON:  CT earlier today. FINDINGS: No bowel dilatation to suggest obstruction. Suspected decreased bladder distension from earlier CT. No radiopaque calculi. IMPRESSION: Nonobstructive bowel gas pattern. Suspect decreased bladder distension from earlier CT. Electronically Signed   By: Keith Rake M.D.   On: 02/03/2023 19:38   CT ABDOMEN PELVIS WO CONTRAST  Result Date: 02/03/2023 CLINICAL DATA:  Nausea, vomiting and diarrhea. EXAM: CT ABDOMEN AND PELVIS WITHOUT CONTRAST TECHNIQUE: Multidetector CT imaging of the abdomen and pelvis was performed following the standard  protocol without IV contrast. RADIATION DOSE REDUCTION: This exam was performed according to the departmental dose-optimization program which includes automated exposure control, adjustment of the mA and/or kV according to patient size and/or use of iterative reconstruction technique. COMPARISON:  January 06, 2023 FINDINGS: Lower chest: No acute abnormality. Hepatobiliary: The liver is enlarged. No focal liver abnormality is seen. No gallstones, gallbladder wall thickening, or biliary dilatation. Pancreas: Unremarkable. No pancreatic ductal dilatation or surrounding inflammatory changes. Spleen: Normal in size without focal abnormality. Adrenals/Urinary Tract: Adrenal glands are unremarkable. Kidneys are normal, without renal calculi, focal lesion, or hydronephrosis. The urinary bladder is markedly distended. Stomach/Bowel: Stable, moderate severity circumferential thickening of the visualized portion of the distal esophagus is seen. There is marked severity gastric distension. Mild thickening of the gastric antrum and duodenal bulb is also seen. The remaining large and small bowel loops within the abdomen are normal in caliber. Appendix appears normal. Noninflamed diverticula are seen throughout the large bowel. Vascular/Lymphatic: Aortic atherosclerosis. No enlarged abdominal or pelvic lymph nodes. Reproductive: Status post hysterectomy. No adnexal masses. Other: No abdominal wall hernia or abnormality. No abdominopelvic ascites. Musculoskeletal: No acute or significant osseous findings. IMPRESSION: 1. Stable circumferential thickening of the distal esophagus, which may represent sequelae associated with esophagitis. 2. Findings which may represent mild gastritis and/or duodenitis with subsequent partial gastric outlet obstruction. 3. Colonic diverticulosis. 4. Markedly distended urinary bladder. 5. Aortic atherosclerosis. Aortic Atherosclerosis (ICD10-I70.0). Electronically Signed   By: [name] M.D.    On: 02/03/2023 04:17     Impression / Plan:   Impression: 1.  DKA: With nausea and vomiting after holding her insulin for a heart cath, hospitalist team is managing 2.  Hypothyroidism 3.  History of nonischemic cardiomyopathy 4.  Gastroparesis secondary to uncontrolled diabetes: Typically controlled on Reglan 5 mg 4 times daily 5.  Abnormal CT of the abdomen/esophagus: Stable appearance of likely erosive esophagitis as seen previously on EGDs the last in 2021, patient does describe a small amount of hematemesis while vomiting yesterday, none since, also question of gastric outlet obstruction but tolerating a clear liquid diet perfectly fine overnight; likely erosive esophagitis  Plan: 1.  Did discuss with patient that we could do an EGD outpatient given that this is likely her known esophagitis but she is very worried about this ongoing epigastric pain and question of ulcers and also the fact that she saw small amount of blood in her vomitus.  We will go ahead and schedule her for an EGD tomorrow with Dr. Fuller Plan.  Did discuss risks, benefits, limitations and alternatives the patient and she agrees to proceed. 2.  For now continue Pantoprazole 40 mg IV twice daily 3.  Continue antiemetics as needed 4.  Most importantly need to get control of her diabetes 5.  Can resume Reglan 5 mg 4 times daily at discharge and continue this as outpatient, will need follow-up in our office  for further refills 6.  Pending results from EGD patient would likely benefit from Carafate 7.  Changed to a full liquid diet and n.p.o. at midnight.  Thank you for your kind consultation, we will continue to follow.  Lavone Nian [facility]  02/04/2023, 9:21 AM   Attending Physician Note   I have taken a history, reviewed the chart and examined the patient. I performed a substantive portion of this encounter, including complete performance of at least one of the key components, in conjunction with the APP. I agree with the  APP's note, impression and recommendations with my edits. My additional impressions and recommendations are as follows.   Gastroparesis, DKA, DM with recurrent nausea, vomiting and small volume hematemesis   History of severe esophagitis and duodenal ulcers   Abnormal CT of the esophagus likely secondary to erosive esophagitis  EGD tomorrow Continue pantoprazole 40 mg IV twice daily Resume Reglan 5 mg before meals and at bedtime Advance to full liquids Improved control of DM   Lucio Edward, MD [facility] See AM[facility], [place], for our on call provider

## 2023-02-05 ENCOUNTER — Encounter (HOSPITAL_COMMUNITY): Payer: Self-pay | Admitting: Internal Medicine

## 2023-02-05 ENCOUNTER — Inpatient Hospital Stay (HOSPITAL_COMMUNITY): Payer: BC Managed Care – PPO | Admitting: Certified Registered"

## 2023-02-05 ENCOUNTER — Encounter (HOSPITAL_COMMUNITY)
Admission: EM | Disposition: A | Discharge status: Home / Self Care | Payer: Self-pay | Source: Home / Self Care | Attending: Internal Medicine

## 2023-02-05 ENCOUNTER — Other Ambulatory Visit (HOSPITAL_COMMUNITY): Payer: Self-pay

## 2023-02-05 DIAGNOSIS — E1110 Type 2 diabetes mellitus with ketoacidosis without coma: Secondary | ICD-10-CM | POA: Diagnosis not present

## 2023-02-05 DIAGNOSIS — R933 Abnormal findings on diagnostic imaging of other parts of digestive tract: Secondary | ICD-10-CM

## 2023-02-05 DIAGNOSIS — K3184 Gastroparesis: Secondary | ICD-10-CM | POA: Diagnosis not present

## 2023-02-05 DIAGNOSIS — E1143 Type 2 diabetes mellitus with diabetic autonomic (poly)neuropathy: Secondary | ICD-10-CM | POA: Diagnosis not present

## 2023-02-05 DIAGNOSIS — R112 Nausea with vomiting, unspecified: Secondary | ICD-10-CM

## 2023-02-05 DIAGNOSIS — K2950 Unspecified chronic gastritis without bleeding: Secondary | ICD-10-CM

## 2023-02-05 HISTORY — PX: BIOPSY: SHX5522

## 2023-02-05 HISTORY — PX: ESOPHAGOGASTRODUODENOSCOPY (EGD) WITH PROPOFOL: SHX5813

## 2023-02-05 LAB — GLUCOSE, CAPILLARY
Glucose-Capillary: 176 mg/dL — ABNORMAL HIGH (ref 70–99)
Glucose-Capillary: 168 mg/dL — ABNORMAL HIGH (ref 70–99)
Glucose-Capillary: 179 mg/dL — ABNORMAL HIGH (ref 70–99)
Glucose-Capillary: 112 mg/dL — ABNORMAL HIGH (ref 70–99)
Glucose-Capillary: 153 mg/dL — ABNORMAL HIGH (ref 70–99)

## 2023-02-05 SURGERY — ESOPHAGOGASTRODUODENOSCOPY (EGD) WITH PROPOFOL
Anesthesia: Monitor Anesthesia Care

## 2023-02-05 MED ORDER — LABETALOL HCL 5 MG/ML IV SOLN
INTRAVENOUS | Status: AC
  Filled 2023-02-05: qty 4

## 2023-02-05 MED ORDER — METOCLOPRAMIDE HCL 5 MG PO TABS
5.0000 mg | ORAL_TABLET | Freq: Three times a day (TID) | ORAL | Status: DC
  Administered 2023-02-05 – 2023-02-06 (×4): 5 mg via ORAL
  Filled 2023-02-05 (×4): qty 1

## 2023-02-05 MED ORDER — LIDOCAINE 2% (20 MG/ML) 5 ML SYRINGE
INTRAMUSCULAR | Status: DC | PRN
  Administered 2023-02-05: 60 mg via INTRAVENOUS

## 2023-02-05 MED ORDER — LACTATED RINGERS IV SOLN: INTRAVENOUS | Status: DC | PRN

## 2023-02-05 MED ORDER — PROPOFOL 10 MG/ML IV BOLUS
INTRAVENOUS | Status: DC | PRN
  Administered 2023-02-05: 20 mg via INTRAVENOUS

## 2023-02-05 MED ORDER — LABETALOL HCL 5 MG/ML IV SOLN
5.0000 mg | INTRAVENOUS | Status: DC | PRN
  Administered 2023-02-05: 5 mg via INTRAVENOUS

## 2023-02-05 MED ORDER — PROPOFOL 500 MG/50ML IV EMUL
INTRAVENOUS | Status: AC
  Filled 2023-02-05: qty 50

## 2023-02-05 MED ORDER — PROPOFOL 500 MG/50ML IV EMUL
INTRAVENOUS | Status: DC | PRN
  Administered 2023-02-05: 125 ug/kg/min via INTRAVENOUS

## 2023-02-05 SURGICAL SUPPLY — 15 items

## 2023-02-05 NOTE — Op Note (Addendum)
[facility] Patient Name: [name] [name] Procedure Date: 02/05/2023 MRN: [id number] Attending MD: Ladene Artist , MD, [id number] Date of Birth: 08/31/1961 CSN: [id number] Age: 62 Admit Type: Inpatient Procedure:                Upper GI endoscopy Indications:              Hematemesis, Abnormal CT of the GI tract                            (esophagus), Nausea with vomiting Providers:                Lucio Edward, MD Referring MD:             [facility] Medicines:                Monitored Anesthesia Care Complications:            No immediate complications. Estimated Blood Loss:     Estimated blood loss was minimal. Procedure:                Pre-Anesthesia Assessment:                           - Prior to the procedure, a History and Physical                            was performed, and patient medications and                            allergies were reviewed. The patient's tolerance of                            previous anesthesia was also reviewed. The risks                            and benefits of the procedure and the sedation                            options and risks were discussed with the patient.                            All questions were answered, and informed consent                            was obtained. Prior Anticoagulants: The patient has                            taken no anticoagulant or antiplatelet agents. ASA                            Grade Assessment: II - A patient with mild systemic                            disease. After reviewing the risks and benefits,  the patient was deemed in satisfactory condition to                            undergo the procedure.                           After obtaining informed consent, the endoscope was                            passed under direct vision. Throughout the                            procedure, the patient's blood pressure, pulse, and                            oxygen  saturations were monitored continuously. The                            [id number] Olympus endoscope was introduced                            through the mouth, and advanced to the second part                            of duodenum. The upper GI endoscopy was                            accomplished without difficulty. The patient                            tolerated the procedure well. Scope In: Scope Out: Findings:      LA Grade C (one or more mucosal breaks continuous between tops of 2 or       more mucosal folds, less than 75% circumference) esophagitis with no       bleeding was found in the distal esophagus.      The exam of the esophagus was otherwise normal.      Patchy mildly erythematous mucosa without bleeding was found in the       gastric antrum. Biopsies were taken with a cold forceps for histology.      The exam of the stomach was otherwise normal.      Patchy moderately erythematous mucosa without active bleeding and with       no stigmata of bleeding was found in the duodenal bulb and in the second       portion of the duodenum. Impression:               - LA Grade C reflux esophagitis with no bleeding.                           - Erythematous mucosa in the antrum. Biopsied.                           - Erythematous duodenopathy. Moderate Sedation:      Not Applicable - Patient had care per Anesthesia. Recommendation:           -  Return patient to hospital ward for ongoing care.                           - Full liquid diet today and advance as tolerated.                           - Antireflux measures long term.                           - Continue present medications including                            pantoprazole 40 mg bid.                           - Resume metoclopramide 5 mg tid ac.                           - Await pathology results.                           - GI signing off.                           - Outpatient GI follow up with Ellouise Newer,                             PA-C in 1 month. Procedure Code(s):        --- Professional ---                           [phone], Esophagogastroduodenoscopy, flexible,                            transoral; with biopsy, single or multiple Diagnosis Code(s):        --- Professional ---                           K21.00, Gastro-esophageal reflux disease with                            esophagitis, without bleeding                           K31.89, Other diseases of stomach and duodenum                           K92.0, Hematemesis                           R11.2, Nausea with vomiting, unspecified                           R93.3, Abnormal findings on diagnostic imaging of                            other parts of digestive tract CPT  copyright 2022 [employer] Association. All rights reserved. The codes documented in this report are preliminary and upon coder review may  be revised to meet current compliance requirements. Ladene Artist, MD 02/05/2023 8:04:33 AM This report has been signed electronically. Number of Addenda: 0

## 2023-02-05 NOTE — Progress Notes (Signed)
PROGRESS NOTE    [name] [name]  [id number] DOB: 12/24/1960 DOA: 02/02/2023 PCP: Rockne Menghini, NP   Brief Narrative:  [name] [name] is a 62 y.o. female with medical history significant of uncontrolled type 2 diabetes complicated by multiple hospital admissions.  Patient has had 2 admissions in January 2024 and 2 ER visits this year so far.  She had a hospital admission in [place] in January 2024. Patient had a heart cath at [facility] yesterday she was told not to take her insulin so she did not take it yesterday however she took her insulin today.  No intervention was recommended after the cath.  She denied chest pain shortness of breath fever chills cough.  She complains of nausea vomiting and diarrhea multiple times.  She denies abdominal pain.  The cath was done to the right groin where she really has no complaints of pain there.   Patient had the last hospital admission on January 08, 2023 When she was diagnosed with DKA.  At that time workup revealed a very elevated TSH of 50.  She was told to repeat her TSH in 2 weeks but there is no documentation of that in the EMR.  She reports taking all her medications as prescribed. She denies dysuria frequency. ED Course: CT of the abdomen and pelvis concerning for bladder distention and urinary retention. Blood pressure 166/104 pulse is 116 respirations 16 she is afebrile saturating 99% on room air. She was started on insulin drip. Her sodium was 138 potassium was 3.4 BUN was 21 creatinine 0.82 she had anion gap of 20. White count 13.9.  Hemoglobin 15.4.  Beta hydroxybutyrate level is 4.16.  Last hemoglobin A1c was 13 in January 2024. UA shows ketones and proteinuria. LA 2.5  Assessment & Plan:   Active Problems:   Gastroparesis due to DM ([place])   Acute urinary retention   Hypothyroidism   Hyperlipidemia   GERD   DKA (diabetic ketoacidosis) ([place])   AKI (acute kidney injury) ([place])   Abnormal CT scan, esophagus  #1 diabetic ketoacidosis  -due to noncompliance.  Patient reports that she could not afford insulin and she was stretching the insulin that she has.  Discussed with diabetic coordinator.  She would benefit from 70/30 insulin on discharge since it is cheaper. workup shows her UA does not reveal any evidence of UTI,  CT abdomen and pelvis shows circumferential thickening of the distal esophagus, mild gastritis or duodenitis with subsequent partial gastric outlet obstruction.  Colonic diverticulosis.   She is having bowel movements often nauseous but no vomiting.  She is tolerating clear liquids today.   #2 hypothyroidism severe -TSH 24, T40.66.  TSH trending down.  Continue Synthroid.   #4 history of nonischemic cardiomyopathy-stable  Will restart some of her home medications.   #5 gastroparesis secondary to uncontrolled diabetes symptomatic treatment Continue Reglan.   #6  Bladder distention by CT scan patient has a pure wick in place with good urine output  #7 orthostatic hypotension on midodrine at home will restart   #8 abnormal CT scan of the abdomen and pelvis with esophageal wall thickening, gastritis, duodenitis   EGD  grade C reflux esophagitis with no bleeding, erythematous mucosa in the antrum biopsied, erythematous duodenopathy recommending full liquid diet today and advance as tolerated Protonix 40 twice daily, Reglan 5 mg 3 times daily follow-up of biopsy results.  Estimated body mass index is 23.24 kg/m as calculated from the following:   Height as of this encounter:  $'4\' 11"'o$  (1.499 m).   Weight as of this encounter: 52.2 kg.  DVT prophylaxis: lovenox Code Status:full Family Communication:none Disposition Plan:  Status is: Inpatient Remains inpatient appropriate because: dka vomiting   Consultants:  gi  Procedures: none Antimicrobials:none  Subjective: Seen after endoscopy Eating full liquids Complains of some nausea no vomiting Objective: Vitals:   02/05/23 0835 02/05/23 0840 02/05/23 0852  02/05/23 1009  BP: (!) 187/106 (!) 177/106 (!) 172/103 130/86  Pulse: 92 93 94 93  Resp: '10 10 12 17  '$ Temp:    97.7 F (36.5 C)  TempSrc:    Oral  SpO2:  100% 100% 99%  Weight:      Height:        Intake/Output Summary (Last 24 hours) at 02/05/2023 1426 Last data filed at 02/05/2023 1342 Gross per 24 hour  Intake 1582.37 ml  Output --  Net 1582.37 ml    Filed Weights   02/03/23 1212 02/05/23 0701 02/05/23 0805  Weight: 54 kg 52.2 kg 52.2 kg    Examination:  General exam: Appears calm and comfortable  Respiratory system: Clear to auscultation. Respiratory effort normal. Cardiovascular system: S1 & S2 heard, RRR. No JVD, murmurs, rubs, gallops or clicks. No pedal edema. Gastrointestinal system: Abdomen is nondistended, soft and tender. No organomegaly or masses felt. Normal bowel sounds heard. Central nervous system: Alert and oriented. No focal neurological deficits. Extremities: no edema Skin: No rashes, lesions or ulcers Psychiatry: Judgement and insight appear normal. Mood & affect appropriate.     Data Reviewed: I have personally reviewed following labs and imaging studies  CBC: Recent Labs  Lab 02/02/23 1450 02/02/23 1900 02/04/23 0346  WBC 13.9* 13.3* 12.1*  NEUTROABS 11.2*  --   --   HGB 15.4* 13.9 13.9  HCT 47.0* 42.7 43.1  MCV 86.4 87.7 89.0  PLT 408* 352 [id number]    Basic Metabolic Panel: Recent Labs  Lab 02/02/23 2114 02/03/23 0119 02/03/23 0536 02/03/23 0840 02/03/23 1348 02/04/23 0346  NA 138 138 131* 132*  --  132*  K 3.3* 3.1* 3.8 3.8  --  3.9  CL 101 98 99 97*  --  103  CO2 '25 27 22 23  '$ --  18*  GLUCOSE 204* 196* 213* 159*  --  203*  BUN '14 15 13 10  '$ --  12  CREATININE 0.60 0.55 0.46 0.48  --  0.54  CALCIUM 8.2* 8.6* 8.4* 8.4*  --  8.4*  MG  --   --   --   --  1.8  --     GFR: Estimated Creatinine Clearance: 55.9 mL/min (by C-G formula based on SCr of 0.54 mg/dL). Liver Function Tests: Recent Labs  Lab 02/02/23 1450 02/04/23 0346   AST 21 14*  ALT 15 13  ALKPHOS 77 60  BILITOT 0.9 1.0  PROT 8.2* 6.5  ALBUMIN 4.5 3.3*    Recent Labs  Lab 02/02/23 1450 02/03/23 1348  LIPASE 40 98*    No results for input(s): "AMMONIA" in the last 168 hours. Coagulation Profile: No results for input(s): "INR", "PROTIME" in the last 168 hours. Cardiac Enzymes: No results for input(s): "CKTOTAL", "CKMB", "CKMBINDEX", "TROPONINI" in the last 168 hours. BNP (last 3 results) No results for input(s): "PROBNP" in the last 8760 hours. HbA1C: Recent Labs    02/02/23 1900 02/04/23 0346  HGBA1C 13.4* 13.3*    CBG: Recent Labs  Lab 02/04/23 1626 02/04/23 2025 02/05/23 0733 02/05/23 0840 02/05/23 1015  GLUCAP 143* 210*  176* 168* 179*    Lipid Profile: No results for input(s): "CHOL", "HDL", "LDLCALC", "TRIG", "CHOLHDL", "LDLDIRECT" in the last 72 hours. Thyroid Function Tests: Recent Labs    02/02/23 1900 02/03/23 0154  TSH 24.206*  --   FREET4  --  0.66    Anemia Panel: No results for input(s): "VITAMINB12", "FOLATE", "FERRITIN", "TIBC", "IRON", "RETICCTPCT" in the last 72 hours. Sepsis Labs: Recent Labs  Lab 02/02/23 1615 02/02/23 2111 02/03/23 1348  LATICACIDVEN 2.5* 1.7 0.9     Recent Results (from the past 240 hour(s))  Resp panel by RT-PCR (RSV, Flu A&B, Covid) Anterior Nasal Swab     Status: None   Collection Time: 02/02/23  2:41 PM   Specimen: Anterior Nasal Swab  Result Value Ref Range Status   SARS Coronavirus 2 by RT PCR NEGATIVE NEGATIVE Final    Comment: (NOTE) SARS-CoV-2 target nucleic acids are NOT DETECTED.  The SARS-CoV-2 RNA is generally detectable in upper respiratory specimens during the acute phase of infection. The lowest concentration of SARS-CoV-2 viral copies this assay can detect is 138 copies/mL. A negative result does not preclude SARS-Cov-2 infection and should not be used as the sole basis for treatment or other patient management decisions. A negative result may  occur with  improper specimen collection/handling, submission of specimen other than nasopharyngeal swab, presence of viral mutation(s) within the areas targeted by this assay, and inadequate number of viral copies(<138 copies/mL). A negative result must be combined with clinical observations, patient history, and epidemiological information. The expected result is Negative.  Fact Sheet for Patients:  EntrepreneurPulse.com.au  Fact Sheet for Healthcare Providers:  IncredibleEmployment.be  This test is no t yet approved or cleared by the [place] FDA and  has been authorized for detection and/or diagnosis of SARS-CoV-2 by FDA under an Emergency Use Authorization (EUA). This EUA will remain  in effect (meaning this test can be used) for the duration of the COVID-19 declaration under Section 564(b)(1) of the Act, 21 U.S.C.section 360bbb-3(b)(1), unless the authorization is terminated  or revoked sooner.       Influenza A by PCR NEGATIVE NEGATIVE Final   Influenza B by PCR NEGATIVE NEGATIVE Final    Comment: (NOTE) The Xpert Xpress SARS-CoV-2/FLU/RSV plus assay is intended as an aid in the diagnosis of influenza from Nasopharyngeal swab specimens and should not be used as a sole basis for treatment. Nasal washings and aspirates are unacceptable for Xpert Xpress SARS-CoV-2/FLU/RSV testing.  Fact Sheet for Patients: EntrepreneurPulse.com.au  Fact Sheet for Healthcare Providers: IncredibleEmployment.be  This test is not yet approved or cleared by the [place] FDA and has been authorized for detection and/or diagnosis of SARS-CoV-2 by FDA under an Emergency Use Authorization (EUA). This EUA will remain in effect (meaning this test can be used) for the duration of the COVID-19 declaration under Section 564(b)(1) of the Act, 21 U.S.C. section 360bbb-3(b)(1), unless the authorization is terminated  or revoked.     Resp Syncytial Virus by PCR NEGATIVE NEGATIVE Final    Comment: (NOTE) Fact Sheet for Patients: EntrepreneurPulse.com.au  Fact Sheet for Healthcare Providers: IncredibleEmployment.be  This test is not yet approved or cleared by the [place] FDA and has been authorized for detection and/or diagnosis of SARS-CoV-2 by FDA under an Emergency Use Authorization (EUA). This EUA will remain in effect (meaning this test can be used) for the duration of the COVID-19 declaration under Section 564(b)(1) of the Act, 21 U.S.C. section 360bbb-3(b)(1), unless the authorization is terminated or  revoked.  Performed at [facility], [place] [address] [place], [place] [address]          Radiology Studies: No results found.    Scheduled Meds:  insulin aspart  0-9 Units Subcutaneous TID WC   insulin glargine-yfgn  4 Units Subcutaneous Q24H   metoCLOPramide  5 mg Oral TID AC   pantoprazole (PROTONIX) IV  40 mg Intravenous Q12H   Continuous Infusions:  lactated ringers       LOS: 3 days    Time spent: 33 MIN  Georgette Shell, MD 02/05/2023, 2:26 PM

## 2023-02-05 NOTE — Anesthesia Postprocedure Evaluation (Signed)
Anesthesia Post Note  Patient: [name] [name]  Procedure(s) Performed: ESOPHAGOGASTRODUODENOSCOPY (EGD) WITH PROPOFOL BIOPSY     Patient location during evaluation: Endoscopy Anesthesia Type: MAC Level of consciousness: awake Pain management: pain level controlled Vital Signs Assessment: post-procedure vital signs reviewed and stable Respiratory status: spontaneous breathing, nonlabored ventilation and respiratory function stable Cardiovascular status: blood pressure returned to baseline and stable Postop Assessment: no apparent nausea or vomiting Anesthetic complications: no   No notable events documented.  Last Vitals:  Vitals:   02/05/23 0852 02/05/23 1009  BP: (!) 172/103 130/86  Pulse: 94 93  Resp: 12 17  Temp:  36.5 C  SpO2: 100% 99%    Last Pain:  Vitals:   02/05/23 1009  TempSrc: Oral  PainSc:                  Cristol Engdahl P Antonyo Hinderer

## 2023-02-05 NOTE — Progress Notes (Signed)
.   Transition of Care (TOC) Screening Note   Patient Details  Name: [name] [name] Date of Birth: 04-17-1961   Transition of Care [facility]) CM/SW Contact:    Illene Regulus, LCSW Phone Number: 02/05/2023, 1:49 PM    Transition of Care Department [facility]) has reviewed patient and no TOC needs have been identified at this time. We will continue to monitor patient advancement through interdisciplinary progression rounds. If new patient transition needs arise, please place a TOC consult.

## 2023-02-05 NOTE — Anesthesia Preprocedure Evaluation (Addendum)
Anesthesia Evaluation  Patient identified by MRN, date of birth, ID band Patient awake    Reviewed: Allergy & Precautions, NPO status , Patient's Chart, lab work & pertinent test results  Airway Mallampati: III  TM Distance: >3 FB Neck ROM: Full    Dental  (+) Poor Dentition, Missing   Pulmonary former smoker   Pulmonary exam normal        Cardiovascular hypertension, +CHF  Normal cardiovascular exam     Neuro/Psych  Headaches PSYCHIATRIC DISORDERS Anxiety Depression     Neuromuscular disease    GI/Hepatic Neg liver ROS, PUD,,,  Endo/Other  diabetesHypothyroidism    Renal/GU Renal disease     Musculoskeletal negative musculoskeletal ROS (+)    Abdominal   Peds  Hematology negative hematology ROS (+)   Anesthesia Other Findings Nausea, Vomiting, Abnormal CT esophagus  Reproductive/Obstetrics                              Anesthesia Physical Anesthesia Plan  ASA: 3  Anesthesia Plan: MAC   Post-op Pain Management:    Induction: Intravenous  PONV Risk Score and Plan: 2 and Propofol infusion and Treatment may vary due to age or medical condition  Airway Management Planned: Nasal Cannula  Additional Equipment:   Intra-op Plan:   Post-operative Plan:   Informed Consent: I have reviewed the patients History and Physical, chart, labs and discussed the procedure including the risks, benefits and alternatives for the proposed anesthesia with the patient or authorized representative who has indicated his/her understanding and acceptance.     Dental advisory given  Plan Discussed with: CRNA  Anesthesia Plan Comments:         Anesthesia Quick Evaluation

## 2023-02-05 NOTE — Transfer of Care (Signed)
Immediate Anesthesia Transfer of Care Note  Patient: [name] [name]  Procedure(s) Performed: ESOPHAGOGASTRODUODENOSCOPY (EGD) WITH PROPOFOL BIOPSY  Patient Location: PACU  Anesthesia Type:MAC  Level of Consciousness: awake, alert , and oriented  Airway & Oxygen Therapy: Patient Spontanous Breathing and Patient connected to nasal cannula oxygen  Post-op Assessment: Report given to RN and Post -op Vital signs reviewed and stable  Post vital signs: Reviewed and stable  Last Vitals:  Vitals Value Taken Time  BP    Temp    Pulse    Resp    SpO2      Last Pain:  Vitals:   02/05/23 0701  TempSrc: Temporal  PainSc: 0-No pain      Patients Stated Pain Goal: 2 ([id number])  Complications: No notable events documented.

## 2023-02-05 NOTE — Interval H&P Note (Signed)
History and Physical Interval Note:  02/05/2023 7:38 AM  [name] [name]  has presented today for surgery, with the diagnosis of Nausea, Vomiting, Abnormal CT esophagus.  The various methods of treatment have been discussed with the patient and family. After consideration of risks, benefits and other options for treatment, the patient has consented to  Procedure(s): ESOPHAGOGASTRODUODENOSCOPY (EGD) WITH PROPOFOL (N/A) as a surgical intervention.  The patient's history has been reviewed, patient examined, no change in status, stable for surgery.  I have reviewed the patient's chart and labs.  Questions were answered to the patient's satisfaction.     Pricilla Riffle. Fuller Plan

## 2023-02-06 ENCOUNTER — Encounter (HOSPITAL_COMMUNITY): Payer: Self-pay | Admitting: Gastroenterology

## 2023-02-06 DIAGNOSIS — K2100 Gastro-esophageal reflux disease with esophagitis, without bleeding: Secondary | ICD-10-CM

## 2023-02-06 DIAGNOSIS — E1143 Type 2 diabetes mellitus with diabetic autonomic (poly)neuropathy: Secondary | ICD-10-CM | POA: Diagnosis not present

## 2023-02-06 DIAGNOSIS — K3184 Gastroparesis: Secondary | ICD-10-CM | POA: Diagnosis not present

## 2023-02-06 LAB — GLUCOSE, CAPILLARY
Glucose-Capillary: 187 mg/dL — ABNORMAL HIGH (ref 70–99)
Glucose-Capillary: 269 mg/dL — ABNORMAL HIGH (ref 70–99)

## 2023-02-06 MED ORDER — MIDODRINE HCL 5 MG PO TABS: 5.0000 mg | ORAL_TABLET | Freq: Two times a day (BID) | ORAL | 3 refills | Status: DC

## 2023-02-06 MED ORDER — ORAL CARE MOUTH RINSE: 15.0000 mL | OROMUCOSAL | Status: DC | PRN

## 2023-02-06 MED ORDER — METOCLOPRAMIDE HCL 5 MG PO TABS: 5.0000 mg | ORAL_TABLET | Freq: Three times a day (TID) | ORAL | 0 refills | Status: DC

## 2023-02-06 MED ORDER — ATORVASTATIN CALCIUM 40 MG PO TABS
80.0000 mg | ORAL_TABLET | Freq: Every day | ORAL | Status: DC
  Administered 2023-02-06: 80 mg via ORAL
  Filled 2023-02-06: qty 2

## 2023-02-06 MED ORDER — PANTOPRAZOLE SODIUM 40 MG PO TBEC: 40.0000 mg | DELAYED_RELEASE_TABLET | Freq: Two times a day (BID) | ORAL | 0 refills | Status: DC

## 2023-02-06 NOTE — Inpatient Diabetes Management (Signed)
Inpatient Diabetes Program Recommendations  AACE/ADA: New Consensus Statement on Inpatient Glycemic Control (2015)  Target Ranges:  Prepandial:   less than 140 mg/dL      Peak postprandial:   less than 180 mg/dL (1-2 hours)      Critically ill patients:  140 - 180 mg/dL   Lab Results  Component Value Date   GLUCAP 187 (H) 02/06/2023   HGBA1C 13.3 (H) 02/04/2023    Review of Glycemic Control  Diabetes history: DM2 Outpatient Diabetes medications: Toujeo 6 units QHS, Humalog 1-4 units TID Current orders for Inpatient glycemic control: Semglee 4 units Q24H, Novolog 0-9 units TID with meals  HgbA1C 13.4%  Inpatient Diabetes Program Recommendations:    For home:  Novolog 70/30 flexpen 5 units BID (before breakfast and before dinner)  (Pt is able to get this insulin for 1 copay of $60 and this would help her not to have to "stretch" the Humalog and Toujeo, which is $120 for both. Discussion with pt and MD.  Continue to follow.   Thank you. Lorenda Peck, RD, LDN, [place] Inpatient Diabetes Coordinator [phone]

## 2023-02-06 NOTE — Discharge Summary (Signed)
Discharge Summary  [name] [name] [id number] DOB: [birth year]  PCP: Rockne Menghini, NP  Admit date: 02/02/2023 Discharge date: 02/06/2023     Time spent: 100mns, more than 50% time spent on coordination of care.   Recommendations for Outpatient Follow-up:  F/u with PCP within a week  for hospital discharge follow up, repeat cbc/bmp at follow up F/u with endocrinology Dr LHartford PoliF/u with GI Dr SFuller Plan Discharge Diagnoses:  Active Hospital Problems   Diagnosis Date Noted   Gastroparesis due to DM ([name] 01/22/2017    Priority: 1.   Acute urinary retention 01/07/2023    Priority: 2.   Abnormal CT scan, esophagus 02/05/2023   AKI (acute kidney injury) ([name] 02/03/2023   DKA (diabetic ketoacidosis) ([name] 02/02/2023   GERD 04/08/2009   Hypothyroidism 04/29/2008   Hyperlipidemia 04/29/2008    Resolved Hospital Problems  No resolved problems to display.    Discharge Condition: stable  Diet recommendation: heart healthy/carb modified  Filed Weights   02/03/23 1212 02/05/23 0701 02/05/23 0805  Weight: 54 kg 52.2 kg 52.2 kg    History of present illness: ( per admitting MD Dr MRodena Piety Chief Complaint: Nausea vomiting and diarrhea   HPI: [name] MCFAILis a 62y.o. female with medical history significant of uncontrolled type 2 diabetes complicated by multiple hospital admissions.  Patient has had 2 admissions in January 2024 and 2 ER visits this year so far.  [place] [facility] not to take her insulin so she did not take it yesterday however she took her insulin today.  No intervention was recommended after the cath.  She denied chest pain shortness of breath fever chills cough.  She complains of nausea vomiting and diarrhea multiple times.  She denies abdominal pain.  The cath was done to the right groin where she really has no complaints of pain there.   Patient had the last hospital  admission on January 08, 2023 When she was diagnosed with DKA.  At that time workup revealed a very elevated TSH of 50.  She was told to repeat her TSH in 2 weeks but there is no documentation of that in the EMR.  She reports taking all her medications as prescribed. She denies dysuria frequency. ED Course: CT of the abdomen and pelvis concerning for bladder distention and urinary retention. Blood pressure 166/104 pulse is 116 respirations 16 she is afebrile saturating 99% on room air. She was started on insulin drip. Her sodium was 138 potassium was 3.4 BUN was 21 creatinine 0.82 she had anion gap of 20. White count 13.9.  Hemoglobin 15.4.  Beta hydroxybutyrate level is 4.16.  Last hemoglobin A1c was 13 in January 2024. UA shows ketones and proteinuria. LA 2.5  Hospital Course:  Active Problems:   Gastroparesis due to DM ([name])   Acute urinary retention   Hypothyroidism   Hyperlipidemia   GERD   DKA (diabetic ketoacidosis) ([name])   AKI (acute kidney injury) ([name]   Abnormal CT scan, esophagus   Assessment and Plan:   DKA, resolved, transition back to home regimen,  reports medication noncompliance due to cost issues, not she mets her deductible, reports should not have issue with medication cost this year, she elected to go home on previous regimen, she is made aware of 70/30 option and [employer] Relion insulin option She is to follow up with endocrinology Dr LHartford Poli N/V,  Resolved EGD showed "LA Grade C reflux esophagitis with no bleeding.                           - Erythematous mucosa in the antrum. Biopsied.                           - Erythematous duodenopathy." GI recommend ppi bid, f/u with GI in a month   Gastroparesis Reports taking reglan at home for years, ate 100% of meal on day of discharge G/u with gi   Hypothyroidism-TSH 24, T40.66. TSH trending down.  Noncompliance with  Synthroid, she reports is instructed by her endocrinologist  to take multiple pills once a week  due to trouble remembering taking meds,  F/u with endocrinology   nonischemic cardiomyopathy- Chronic diastolic chf stable  Resume home medications  Chronic orthostatic hypotension Reports taking midodrine at home She reports never has hight blood pressure at home , her blood pressure is noticed to be high on presentation, possible due to stress She is instructed to check blood pressure at home and bring in record for pcp and her cardiology to review  Recent h/o urinary retention required short term foley in 12/2022, she currently denies issue with urination Discontinue oxybutynin     Discharge Exam: BP (!) 154/94 (BP Location: Left Arm)   Pulse 92   Temp 98.1 F (36.7 C) (Oral)   Resp 16   Ht '4\' 11"'$  (1.499 m)   Wt 52.2 kg   LMP 08/11/2012   SpO2 100%   BMI 23.24 kg/m   General: NAD Cardiovascular: RRR Respiratory: normal respiratory effort     Discharge Instructions     Diet - low sodium heart healthy   Complete by: As directed    Carb modified diet   Increase activity slowly   Complete by: As directed       Allergies as of 02/06/2023       Reactions   Codeine Hives, Anxiety, Other (See Comments)        Medication List     STOP taking these medications    oxybutynin 5 MG tablet Commonly known as: DITROPAN       TAKE these medications    aspirin EC 81 MG tablet Take 81 mg by mouth daily. Swallow whole.   atorvastatin 80 MG tablet Commonly known as: LIPITOR Take 1 tablet (80 mg total) by mouth daily.   Dexcom G6 Sensor Misc INJECT 1 SENSOR INTO SKIN EVERY 10 DAYS   Dexcom G6 Transmitter Misc USE AS DIRECTED FOR CONTINUOUS GLUCOSE MONITORING. REUSE TRANSMITTER X 90DAYS THEN DISCARD & REPLACE   eletriptan 40 MG tablet Commonly known as: RELPAX Take 1 tablet (40 mg total) by mouth as needed for migraine or headache. May repeat in 2 hours if headache persists or recurs. What changed:  when to take this additional instructions   insulin  lispro 100 UNIT/ML KwikPen Commonly known as: HumaLOG KwikPen INJECT INTO SKIN 3 TIMES A DAY AT MEAL TIME ACCORDING TO THE FOLLOWING SLIDING SCALE: 27 UNITS MAX PER DAY DX E11.43 What changed:  how much to take how to take this when to take this additional instructions   levothyroxine 137 MCG tablet Commonly known as: SYNTHROID Take 959 mcg by mouth every Sunday.   metoCLOPramide 5 MG tablet Commonly known as: REGLAN Take 1 tablet (5 mg total) by mouth 3 (three) times daily before meals.  midodrine 5 MG tablet Commonly known as: PROAMATINE Take 1 tablet (5 mg total) by mouth 2 (two) times daily with a meal. Hold if your systolic blood pressure  ( top number) is > 174mhg, please check your blood pressure at home once a day and bring in record for your doctor to review, follow up with your pcp and cardiology regarding your blood pressure medication What changed: additional instructions   onetouch ultrasoft lancets Use to check blood sugar daily as needed.  Dx: E11.10   pantoprazole 40 MG tablet Commonly known as: Protonix Take 1 tablet (40 mg total) by mouth 2 (two) times daily.   Pen Needles 31G X 8 MM Misc Use to inject insulin 4 times a day.  Dx: E11.10   promethazine 25 MG tablet Commonly known as: PHENERGAN Take 1 tablet (25 mg total) by mouth every 8 (eight) hours as needed for nausea or vomiting.   Toujeo SoloStar 300 UNIT/ML Solostar Pen Generic drug: insulin glargine (1 Unit Dial) Inject 6 Units into the skin at bedtime.   traZODone 50 MG tablet Commonly known as: DESYREL Take 100 mg by mouth at bedtime as needed for sleep.       Allergies  Allergen Reactions   Codeine Hives, Anxiety and Other (See Comments)    Follow-up Information     GRockne Menghini NP Follow up in 1 week(s).   Specialty: Family Medicine Why: hospital discharge follow up, repeat basic lab works including cbc/bmp at follow up        LGerome Apley MD Follow up in 1 week(s).    Specialty: Endocrinology Why: for diabetes and hypothyroid management [address]: [phone]        SLadene Artist MD Follow up in 1 month(s).   Specialty: Gastroenterology Why: LA Grade C reflux esophagitis gastroparesis Contact information: [phone]                 The results of significant diagnostics from this hospitalization (including imaging, microbiology, ancillary and laboratory) are listed below for reference.    Significant Diagnostic Studies: DG Abd 1 View  Result Date: 02/03/2023 CLINICAL DATA:  Abdominal pain. EXAM: ABDOMEN - 1 VIEW COMPARISON:  CT earlier today. FINDINGS: No bowel dilatation to suggest obstruction. Suspected decreased bladder distension from earlier CT. No radiopaque calculi. IMPRESSION: Nonobstructive bowel gas pattern. Suspect decreased bladder distension from earlier CT. Electronically Signed   By: MKeith RakeM.D.   On: 02/03/2023 19:38   CT ABDOMEN PELVIS WO CONTRAST  Result Date: 02/03/2023 CLINICAL DATA:  Nausea, vomiting and diarrhea. EXAM: CT ABDOMEN AND PELVIS WITHOUT CONTRAST TECHNIQUE: Multidetector CT imaging of the abdomen and pelvis was performed following the standard protocol without IV contrast. RADIATION DOSE REDUCTION: This exam was performed according to the departmental dose-optimization program which includes automated exposure control, adjustment of the mA and/or kV according to patient size and/or use of iterative reconstruction technique. COMPARISON:  January 06, 2023 FINDINGS: Lower chest: No acute abnormality. Hepatobiliary: The liver is enlarged. No focal liver abnormality is seen. No gallstones, gallbladder wall thickening, or biliary dilatation. Pancreas: Unremarkable. No pancreatic ductal dilatation or surrounding inflammatory changes. Spleen: Normal in size without focal abnormality.  Adrenals/Urinary Tract: Adrenal glands are unremarkable. Kidneys are normal, without renal calculi, focal lesion, or hydronephrosis. The urinary bladder is markedly distended. Stomach/Bowel: Stable, moderate severity circumferential thickening of the visualized portion of  the distal esophagus is seen. There is marked severity gastric distension. Mild thickening of the gastric antrum and duodenal bulb is also seen. The remaining large and small bowel loops within the abdomen are normal in caliber. Appendix appears normal. Noninflamed diverticula are seen throughout the large bowel. Vascular/Lymphatic: Aortic atherosclerosis. No enlarged abdominal or pelvic lymph nodes. Reproductive: Status post hysterectomy. No adnexal masses. Other: No abdominal wall hernia or abnormality. No abdominopelvic ascites. Musculoskeletal: No acute or significant osseous findings. IMPRESSION: 1. Stable circumferential thickening of the distal esophagus, which may represent sequelae associated with esophagitis. 2. Findings which may represent mild gastritis and/or duodenitis with subsequent partial gastric outlet obstruction. 3. Colonic diverticulosis. 4. Markedly distended urinary bladder. 5. Aortic atherosclerosis. Aortic Atherosclerosis (ICD10-I70.0). Electronically Signed   By: [name] M.D.   On: 02/03/2023 04:17    Microbiology: Recent Results (from the past 240 hour(s))  Resp panel by RT-PCR (RSV, Flu A&B, Covid) Anterior Nasal Swab     Status: None   Collection Time: 02/02/23  2:41 PM   Specimen: Anterior Nasal Swab  Result Value Ref Range Status   SARS Coronavirus 2 by RT PCR NEGATIVE NEGATIVE Final    Comment: (NOTE) SARS-CoV-2 target nucleic acids are NOT DETECTED.  The SARS-CoV-2 RNA is generally detectable in upper respiratory specimens during the acute phase of infection. The lowest concentration of SARS-CoV-2 viral copies this assay can detect is 138 copies/mL. A negative result does not preclude  SARS-Cov-2 infection and should not be used as the sole basis for treatment or other patient management decisions. A negative result may occur with  improper specimen collection/handling, submission of specimen other than nasopharyngeal swab, presence of viral mutation(s) within the areas targeted by this assay, and inadequate number of viral copies(<138 copies/mL). A negative result must be combined with clinical observations, patient history, and epidemiological information. The expected result is Negative.  Fact Sheet for Patients:  EntrepreneurPulse.com.au  Fact Sheet for Healthcare Providers:  IncredibleEmployment.be  This test is no t yet approved or cleared by the [place] FDA and  has been authorized for detection and/or diagnosis of SARS-CoV-2 by FDA under an Emergency Use Authorization (EUA). This EUA will remain  in effect (meaning this test can be used) for the duration of the COVID-19 declaration under Section 564(b)(1) of the Act, 21 U.S.C.section 360bbb-3(b)(1), unless the authorization is terminated  or revoked sooner.       Influenza A by PCR NEGATIVE NEGATIVE Final   Influenza B by PCR NEGATIVE NEGATIVE Final    Comment: (NOTE) The Xpert Xpress SARS-CoV-2/FLU/RSV plus assay is intended as an aid in the diagnosis of influenza from Nasopharyngeal swab specimens and should not be used as a sole basis for treatment. Nasal washings and aspirates are unacceptable for Xpert Xpress SARS-CoV-2/FLU/RSV testing.  Fact Sheet for Patients: EntrepreneurPulse.com.au  Fact Sheet for Healthcare Providers: IncredibleEmployment.be  This test is not yet approved or cleared by the [place] FDA and has been authorized for detection and/or diagnosis of SARS-CoV-2 by FDA under an Emergency Use Authorization (EUA). This EUA will remain in effect (meaning this test can be used) for the duration of  the COVID-19 declaration under Section 564(b)(1) of the Act, 21 U.S.C. section 360bbb-3(b)(1), unless the authorization is terminated or revoked.     Resp Syncytial Virus by PCR NEGATIVE NEGATIVE Final    Comment: (NOTE) Fact Sheet for Patients: EntrepreneurPulse.com.au  Fact Sheet for Healthcare Providers: IncredibleEmployment.be  This test is not yet approved or cleared by  the [employer] and has been authorized for detection and/or diagnosis of SARS-CoV-2 by FDA under an Emergency Use Authorization (EUA). This EUA will remain in effect (meaning this test can be used) for the duration of the COVID-19 declaration under Section 564(b)(1) of the Act, 21 U.S.C. section 360bbb-3(b)(1), unless the authorization is terminated or revoked.  Performed at [facility], [place] [address], [place], [place] [address]      Labs: Basic Metabolic Panel: Recent Labs  Lab 02/02/23 2114 02/03/23 0119 02/03/23 0536 02/03/23 0840 02/03/23 1348 02/04/23 0346  NA 138 138 131* 132*  --  132*  K 3.3* 3.1* 3.8 3.8  --  3.9  CL 101 98 99 97*  --  103  CO2 '25 27 22 23  '$ --  18*  GLUCOSE 204* 196* 213* 159*  --  203*  BUN '14 15 13 10  '$ --  12  CREATININE 0.60 0.55 0.46 0.48  --  0.54  CALCIUM 8.2* 8.6* 8.4* 8.4*  --  8.4*  MG  --   --   --   --  1.8  --    Liver Function Tests: Recent Labs  Lab 02/02/23 1450 02/04/23 0346  AST 21 14*  ALT 15 13  ALKPHOS 77 60  BILITOT 0.9 1.0  PROT 8.2* 6.5  ALBUMIN 4.5 3.3*   Recent Labs  Lab 02/02/23 1450 02/03/23 1348  LIPASE 40 98*   No results for input(s): "AMMONIA" in the last 168 hours. CBC: Recent Labs  Lab 02/02/23 1450 02/02/23 1900 02/04/23 0346  WBC 13.9* 13.3* 12.1*  NEUTROABS 11.2*  --   --   HGB 15.4* 13.9 13.9  HCT 47.0* 42.7 43.1  MCV 86.4 87.7 89.0  PLT 408* 352 286   Cardiac Enzymes: No results for input(s): "CKTOTAL", "CKMB", "CKMBINDEX", "TROPONINI" in the  last 168 hours. BNP: BNP (last 3 results) No results for input(s): "BNP" in the last 8760 hours.  ProBNP (last 3 results) No results for input(s): "PROBNP" in the last 8760 hours.  CBG: Recent Labs  Lab 02/05/23 0840 02/05/23 1015 02/05/23 1700 02/05/23 2139 02/06/23 0743  GLUCAP 168* 179* 112* 153* 187*    FURTHER DISCHARGE INSTRUCTIONS:   Get Medicines reviewed and adjusted: Please take all your medications with you for your next visit with your Primary MD   Laboratory/radiological data: Please request your Primary MD to go over all hospital tests and procedure/radiological results at the follow up, please ask your Primary MD to get all Hospital records sent to his/her office.   In some cases, they will be blood work, cultures and biopsy results pending at the time of your discharge. Please request that your primary care M.D. goes through all the records of your hospital data and follows up on these results.   Also Note the following: If you experience worsening of your admission symptoms, develop shortness of breath, life threatening emergency, suicidal or homicidal thoughts you must seek medical attention immediately by calling 911 or calling your MD immediately  if symptoms less severe.   You must read complete instructions/literature along with all the possible adverse reactions/side effects for all the Medicines you take and that have been prescribed to you. Take any new Medicines after you have completely understood and accpet all the possible adverse reactions/side effects.    Do not drive when taking Pain medications or sleeping medications (Benzodaizepines)   Do not take more than prescribed Pain, Sleep and Anxiety Medications. It is not  advisable to combine anxiety,sleep and pain medications without talking with your primary care practitioner   Special Instructions: If you have smoked or chewed Tobacco  in the last 2 yrs please stop smoking, stop any regular Alcohol   and or any Recreational drug use.   Wear Seat belts while driving.   Please note: You were cared for by a hospitalist during your hospital stay. Once you are discharged, your primary care physician will handle any further medical issues. Please note that NO REFILLS for any discharge medications will be authorized once you are discharged, as it is imperative that you return to your primary care physician (or establish a relationship with a primary care physician if you do not have one) for your post hospital discharge needs so that they can reassess your need for medications and monitor your lab values.     Signed:  Florencia Reasons MD, PhD, FACP  [facility] 02/06/2023, 11:35 AM

## 2023-02-08 LAB — SURGICAL PATHOLOGY

## 2023-02-11 ENCOUNTER — Encounter: Payer: Self-pay | Admitting: Gastroenterology

## 2023-03-07 DIAGNOSIS — R079 Chest pain, unspecified: Secondary | ICD-10-CM | POA: Diagnosis not present

## 2023-03-07 DIAGNOSIS — I10 Essential (primary) hypertension: Secondary | ICD-10-CM | POA: Diagnosis not present

## 2023-03-07 DIAGNOSIS — E1143 Type 2 diabetes mellitus with diabetic autonomic (poly)neuropathy: Secondary | ICD-10-CM | POA: Diagnosis not present

## 2023-03-29 ENCOUNTER — Emergency Department (HOSPITAL_COMMUNITY)
Admission: EM | Admit: 2023-03-29 | Discharge: 2023-03-29 | Disposition: A | Discharge status: Home / Self Care | Payer: BC Managed Care – PPO | Attending: Emergency Medicine | Admitting: Emergency Medicine

## 2023-03-29 ENCOUNTER — Emergency Department (HOSPITAL_COMMUNITY): Payer: BC Managed Care – PPO

## 2023-03-29 ENCOUNTER — Encounter (HOSPITAL_COMMUNITY): Payer: Self-pay

## 2023-03-29 ENCOUNTER — Other Ambulatory Visit: Payer: Self-pay

## 2023-03-29 DIAGNOSIS — Z7982 Long term (current) use of aspirin: Secondary | ICD-10-CM | POA: Diagnosis not present

## 2023-03-29 DIAGNOSIS — Z79899 Other long term (current) drug therapy: Secondary | ICD-10-CM | POA: Diagnosis not present

## 2023-03-29 DIAGNOSIS — R109 Unspecified abdominal pain: Secondary | ICD-10-CM | POA: Diagnosis not present

## 2023-03-29 DIAGNOSIS — E1165 Type 2 diabetes mellitus with hyperglycemia: Secondary | ICD-10-CM | POA: Insufficient documentation

## 2023-03-29 DIAGNOSIS — Z794 Long term (current) use of insulin: Secondary | ICD-10-CM | POA: Insufficient documentation

## 2023-03-29 DIAGNOSIS — R112 Nausea with vomiting, unspecified: Secondary | ICD-10-CM | POA: Diagnosis not present

## 2023-03-29 DIAGNOSIS — R739 Hyperglycemia, unspecified: Secondary | ICD-10-CM

## 2023-03-29 DIAGNOSIS — N390 Urinary tract infection, site not specified: Secondary | ICD-10-CM | POA: Insufficient documentation

## 2023-03-29 DIAGNOSIS — R1084 Generalized abdominal pain: Secondary | ICD-10-CM | POA: Diagnosis not present

## 2023-03-29 DIAGNOSIS — D72829 Elevated white blood cell count, unspecified: Secondary | ICD-10-CM | POA: Insufficient documentation

## 2023-03-29 DIAGNOSIS — R1110 Vomiting, unspecified: Secondary | ICD-10-CM | POA: Diagnosis not present

## 2023-03-29 LAB — BASIC METABOLIC PANEL
Anion gap: 11 (ref 5–15)
BUN: 19 mg/dL (ref 6–20)
CO2: 24 mmol/L (ref 22–32)
Calcium: 8.1 mg/dL — ABNORMAL LOW (ref 8.9–10.3)
Chloride: 94 mmol/L — ABNORMAL LOW (ref 98–111)
Creatinine, Ser: 0.63 mg/dL (ref 0.44–1.00)
GFR, Estimated: >60 mL/min (ref >60)
Glucose, Bld: 214 mg/dL — ABNORMAL HIGH (ref 70–99)
Potassium: 3.6 mmol/L (ref 3.5–5.1)
Sodium: 129 mmol/L — ABNORMAL LOW (ref 135–145)

## 2023-03-29 LAB — COMPREHENSIVE METABOLIC PANEL
ALT: 14 U/L (ref 0–44)
AST: 15 U/L (ref 15–41)
Albumin: 4.7 g/dL (ref 3.5–5.0)
Alkaline Phosphatase: 75 U/L (ref 38–126)
Anion gap: 19 — ABNORMAL HIGH (ref 5–15)
BUN: 23 mg/dL — ABNORMAL HIGH (ref 6–20)
CO2: 27 mmol/L (ref 22–32)
Calcium: 9.3 mg/dL (ref 8.9–10.3)
Chloride: 81 mmol/L — ABNORMAL LOW (ref 98–111)
Creatinine, Ser: 0.9 mg/dL (ref 0.44–1.00)
GFR, Estimated: >60 mL/min (ref >60)
Glucose, Bld: 437 mg/dL — ABNORMAL HIGH (ref 70–99)
Potassium: 3.8 mmol/L (ref 3.5–5.1)
Sodium: 127 mmol/L — ABNORMAL LOW (ref 135–145)
Total Bilirubin: 1.3 mg/dL — ABNORMAL HIGH (ref 0.3–1.2)
Total Protein: 8.2 g/dL — ABNORMAL HIGH (ref 6.5–8.1)

## 2023-03-29 LAB — CBC
HCT: 45.5 % (ref 36.0–46.0)
Hemoglobin: 15.4 g/dL — ABNORMAL HIGH (ref 12.0–15.0)
MCH: 28.3 pg (ref 26.0–34.0)
MCHC: 33.8 g/dL (ref 30.0–36.0)
MCV: 83.5 fL (ref 80.0–100.0)
Platelets: 422 10*3/uL — ABNORMAL HIGH (ref 150–400)
RBC: 5.45 MIL/uL — ABNORMAL HIGH (ref 3.87–5.11)
RDW: 14.9 % (ref 11.5–15.5)
WBC: 14.8 10*3/uL — ABNORMAL HIGH (ref 4.0–10.5)
nRBC: 0 % (ref 0.0–0.2)

## 2023-03-29 LAB — URINALYSIS, ROUTINE W REFLEX MICROSCOPIC
Bilirubin Urine: NEGATIVE
Glucose, UA: >=500 mg/dL — AB
Hgb urine dipstick: NEGATIVE
Ketones, ur: 20 mg/dL — AB
Nitrite: NEGATIVE
Protein, ur: 100 mg/dL — AB
Specific Gravity, Urine: 1.025 (ref 1.005–1.030)
pH: 5 (ref 5.0–8.0)

## 2023-03-29 LAB — LIPASE, BLOOD: Lipase: 36 U/L (ref 11–51)

## 2023-03-29 LAB — CBG MONITORING, ED: Glucose-Capillary: 221 mg/dL — ABNORMAL HIGH (ref 70–99)

## 2023-03-29 MED ORDER — DIPHENHYDRAMINE HCL 50 MG/ML IJ SOLN
12.5000 mg | Freq: Once | INTRAMUSCULAR | Status: AC
  Administered 2023-03-29: 12.5 mg via INTRAVENOUS
  Filled 2023-03-29: qty 1

## 2023-03-29 MED ORDER — METOCLOPRAMIDE HCL 5 MG/ML IJ SOLN
10.0000 mg | Freq: Once | INTRAMUSCULAR | Status: AC
  Administered 2023-03-29: 10 mg via INTRAVENOUS
  Filled 2023-03-29: qty 2

## 2023-03-29 MED ORDER — LACTATED RINGERS IV SOLN: INTRAVENOUS | Status: DC

## 2023-03-29 MED ORDER — FENTANYL CITRATE PF 50 MCG/ML IJ SOSY
50.0000 ug | PREFILLED_SYRINGE | Freq: Once | INTRAMUSCULAR | Status: AC
  Administered 2023-03-29: 50 ug via INTRAVENOUS
  Filled 2023-03-29: qty 1

## 2023-03-29 MED ORDER — LACTATED RINGERS IV BOLUS
2000.0000 mL | Freq: Once | INTRAVENOUS | Status: AC
  Administered 2023-03-29: 1000 mL via INTRAVENOUS

## 2023-03-29 MED ORDER — CEPHALEXIN 500 MG PO CAPS: 500.0000 mg | ORAL_CAPSULE | Freq: Four times a day (QID) | ORAL | 0 refills | Status: DC

## 2023-03-29 MED ORDER — INSULIN ASPART 100 UNIT/ML IJ SOLN
10.0000 [IU] | Freq: Once | INTRAMUSCULAR | Status: AC
  Administered 2023-03-29: 10 [IU] via SUBCUTANEOUS
  Filled 2023-03-29: qty 0.1

## 2023-03-29 MED ORDER — IOHEXOL 300 MG/ML  SOLN
100.0000 mL | Freq: Once | INTRAMUSCULAR | Status: AC | PRN
  Administered 2023-03-29: 100 mL via INTRAVENOUS

## 2023-03-29 MED ORDER — SODIUM CHLORIDE 0.9 % IV SOLN
1.0000 g | INTRAVENOUS | Status: DC
  Administered 2023-03-29: 1 g via INTRAVENOUS
  Filled 2023-03-29: qty 10

## 2023-03-29 NOTE — ED Provider Notes (Addendum)
[place] [facility]ERGENCY DEPARTMENT AT [facility] Provider Note   CSN: [id number] Arrival date & time: 03/29/23  8295     History  Chief Complaint  Patient presents with   Abdominal Pain    [name] [name] is a 62 y.o. female.  62 year old female with history of diabetes, diabetic gas paresis, pancreatitis presents with 2 days of midepigastric pain along with vomiting.  Denies any fever or chills.  Abdominal pain her emesis has been nonbilious or bloody.  States that she has not use anything for this.  Denies any urinary symptoms.       Home Medications Prior to Admission medications   Medication Sig Start Date End Date Taking? Authorizing Provider  aspirin EC 81 MG tablet Take 81 mg by mouth daily. Swallow whole.    [provider]  atorvastatin (LIPITOR) 80 MG tablet Take 1 tablet (80 mg total) by mouth daily. 01/12/22   Bedsole, Amy E, MD  Continuous Blood Gluc Sensor (DEXCOM G6 SENSOR) MISC INJECT 1 SENSOR INTO SKIN EVERY 10 DAYS 07/18/22   Bedsole, Amy E, MD  Continuous Blood Gluc Transmit (DEXCOM G6 TRANSMITTER) MISC USE AS DIRECTED FOR CONTINUOUS GLUCOSE MONITORING. REUSE TRANSMITTER X 90DAYS THEN DISCARD & REPLACE 07/05/21   Bedsole, Amy E, MD  eletriptan (RELPAX) 40 MG tablet Take 1 tablet (40 mg total) by mouth as needed for migraine or headache. May repeat in 2 hours if headache persists or recurs. Patient taking differently: Take 40 mg by mouth every 2 (two) hours as needed for migraine or headache. 08/14/22   Bedsole, Amy E, MD  insulin lispro (HUMALOG KWIKPEN) 100 UNIT/ML KwikPen INJECT INTO SKIN 3 TIMES A DAY AT MEAL TIME ACCORDING TO THE FOLLOWING SLIDING SCALE: 27 UNITS MAX PER DAY DX E11.43 Patient taking differently: Inject 1-4 Units into the skin with breakfast, with lunch, and with evening meal. Per sliding scale. 02/26/22   Bedsole, Amy E, MD  Insulin Pen Needle (PEN NEEDLES) 31G X 8 MM MISC Use to inject insulin 4 times a day.  Dx: E11.10 07/21/21    Excell Seltzer, MD  Lancets (ONETOUCH ULTRASOFT) lancets Use to check blood sugar daily as needed.  Dx: E11.10 12/23/17   Excell Seltzer, MD  levothyroxine (SYNTHROID) 137 MCG tablet Take 959 mcg by mouth every Sunday. 03/19/22   [provider]  metoCLOPramide (REGLAN) 5 MG tablet Take 1 tablet (5 mg total) by mouth 3 (three) times daily before meals. 02/06/23   Albertine Grates, MD  midodrine (PROAMATINE) 5 MG tablet Take 1 tablet (5 mg total) by mouth 2 (two) times daily with a meal. Hold if your systolic blood pressure  ( top number) is > , please check your blood pressure at home once a day and bring in record for your doctor to review, follow up with your pcp and cardiology regarding your blood pressure medication 02/06/23   Albertine Grates, MD  pantoprazole (PROTONIX) 40 MG tablet Take 1 tablet (40 mg total) by mouth 2 (two) times daily. 02/06/23 03/08/23  Albertine Grates, MD  promethazine (PHENERGAN) 25 MG tablet Take 1 tablet (25 mg total) by mouth every 8 (eight) hours as needed for nausea or vomiting. 01/05/22   Bedsole, Amy E, MD  TOUJEO SOLOSTAR 300 UNIT/ML Solostar Pen Inject 6 Units into the skin at bedtime. 03/29/22   Regalado, Belkys A, MD  traZODone (DESYREL) 50 MG tablet Take 100 mg by mouth at bedtime as needed for sleep. 02/01/23   [provider]      Allergies    Codeine    Review of Systems   Review of Systems  All other systems reviewed and are negative.   Physical Exam Updated Vital Signs BP (!) 109/90 (BP Location: Left Arm)   Pulse 100   Temp 98 F (36.7 C) (Oral)   Resp 18   Ht 1.499 m ( )   Wt 52.2 kg   LMP 08/11/2012   SpO2 100%   BMI 23.23 kg/m  Physical Exam Vitals and nursing note reviewed.  Constitutional:      General: She is not in acute distress.    Appearance: Normal appearance. She is well-developed. She is not toxic-appearing.  HENT:     Head: Normocephalic and atraumatic.  Eyes:     General: Lids are normal.     Conjunctiva/sclera:  Conjunctivae normal.     Pupils: Pupils are equal, round, and reactive to light.  Neck:     Thyroid: No thyroid mass.     Trachea: No tracheal deviation.  Cardiovascular:     Rate and Rhythm: Normal rate and regular rhythm.     Heart sounds: Normal heart sounds. No murmur heard.    No gallop.  Pulmonary:     Effort: Pulmonary effort is normal. No respiratory distress.     Breath sounds: Normal breath sounds. No stridor. No decreased breath sounds, wheezing, rhonchi or rales.  Abdominal:     General: There is no distension.     Palpations: Abdomen is soft.     Tenderness: There is abdominal tenderness in the epigastric area. There is no rebound.    Musculoskeletal:        General: No tenderness. Normal range of motion.     Cervical back: Normal range of motion and neck supple.  Skin:    General: Skin is warm and dry.     Findings: No abrasion or rash.  Neurological:     Mental Status: She is alert and oriented to person, place, and time. Mental status is at baseline.     GCS: GCS eye subscore is 4. GCS verbal subscore is 5. GCS motor subscore is 6.     Cranial Nerves: No cranial nerve deficit.     Sensory: No sensory deficit.     Motor: Motor function is intact.  Psychiatric:        Attention and Perception: Attention normal.        Speech: Speech normal.        Behavior: Behavior normal.     ED Results / Procedures / Treatments   Labs (all labs ordered are listed, but only abnormal results are displayed) Labs Reviewed  COMPREHENSIVE METABOLIC PANEL - Abnormal; Notable for the following components:      Result Value   Sodium 127 (*)    Chloride 81 (*)    Glucose, Bld 437 (*)    BUN 23 (*)    Total Protein 8.2 (*)    Total Bilirubin 1.3 (*)    Anion gap 19 (*)    All other components within normal limits  CBC - Abnormal; Notable for the following components:   WBC 14.8 (*)    RBC 5.45 (*)    Hemoglobin 15.4 (*)    Platelets 422 (*)    All other components within  normal limits  LIPASE, BLOOD  URINALYSIS, ROUTINE W REFLEX MICROSCOPIC    EKG None  Radiology No results found.  Procedures Procedures  Medications Ordered in ED Medications  lactated ringers bolus 2,000 mL (has no administration in time range)  lactated ringers infusion (has no administration in time range)  insulin aspart (novoLOG) injection 10 Units (has no administration in time range)  metoCLOPramide (REGLAN) injection 10 mg (has no administration in time range)  diphenhydrAMINE (BENADRYL) injection 12.5 mg (has no administration in time range)  fentaNYL (SUBLIMAZE) injection 50 mcg (has no administration in time range)    ED Course/ Medical Decision Making/ A&P                             Medical Decision Making Amount and/or Complexity of Data Reviewed Labs: ordered. Radiology: ordered.  Risk Prescription drug management.   Patient presented with abdominal pain and was treated with IV fluids along with Reglan and fentanyl and feels much better.  Patient's blood sugar was elevated here with mild increase in her anion gap.  Do not feel that she is in DKA however..  Repeat blood sugar has improved.  Mild leukocytosis noted on CBC.  Due to patient's abdominal pain, she required abdominal CT which per interpretation showed no acute findings.  Urinalysis does show infection here.  Will repeat patient's electrolytes after treatment here.  Will give patient first dose of antibiotics here.  Plan will be to place patient on antibiotics and discharge her home.  Patient is comfortable with this plan  12:41 PM Patient had repeat be met which shows that his anion gap is closed.  Heart rate improved.  Will discharge home  CRITICAL CARE Performed by: Toy Baker Total critical care time: 50 minutes Critical care time was exclusive of separately billable procedures and treating other patients. Critical care was necessary to treat or prevent imminent or life-threatening  deterioration. Critical care was time spent personally by me on the following activities: development of treatment plan with patient and/or surrogate as well as nursing, discussions with consultants, evaluation of patient's response to treatment, examination of patient, obtaining history from patient or surrogate, ordering and performing treatments and interventions, ordering and review of laboratory studies, ordering and review of radiographic studies, pulse oximetry and re-evaluation of patient's condition.         Final Clinical Impression(s) / ED Diagnoses Final diagnoses:  None    Rx / DC Orders ED Discharge Orders     None         Lorre Nick, MD 03/29/23 1242    Lorre Nick, MD 03/29/23 1243

## 2023-03-29 NOTE — ED Notes (Signed)
Went into pt room to ask for urine sample. Pt stated she is not able to give a sample at this time.

## 2023-03-29 NOTE — ED Triage Notes (Addendum)
Patient brought in by [facility], reports abd pain and vomiting for the past 24 hours. Has hx of pancreatitis and thinks it is back. Patient tearful in triage. EMS reports glucose is 399.

## 2023-04-11 DIAGNOSIS — E1065 Type 1 diabetes mellitus with hyperglycemia: Secondary | ICD-10-CM | POA: Diagnosis not present

## 2023-04-19 ENCOUNTER — Observation Stay (HOSPITAL_COMMUNITY)
Admission: EM | Admit: 2023-04-19 | Discharge: 2023-04-21 | Disposition: A | Discharge status: Home / Self Care | Payer: BC Managed Care – PPO | Attending: Internal Medicine | Admitting: Internal Medicine

## 2023-04-19 ENCOUNTER — Encounter (HOSPITAL_COMMUNITY): Payer: Self-pay

## 2023-04-19 ENCOUNTER — Other Ambulatory Visit: Payer: Self-pay

## 2023-04-19 ENCOUNTER — Emergency Department (HOSPITAL_COMMUNITY): Payer: BC Managed Care – PPO

## 2023-04-19 DIAGNOSIS — Z794 Long term (current) use of insulin: Secondary | ICD-10-CM | POA: Insufficient documentation

## 2023-04-19 DIAGNOSIS — E1022 Type 1 diabetes mellitus with diabetic chronic kidney disease: Secondary | ICD-10-CM | POA: Diagnosis not present

## 2023-04-19 DIAGNOSIS — N3289 Other specified disorders of bladder: Secondary | ICD-10-CM | POA: Diagnosis not present

## 2023-04-19 DIAGNOSIS — E876 Hypokalemia: Secondary | ICD-10-CM | POA: Insufficient documentation

## 2023-04-19 DIAGNOSIS — R0902 Hypoxemia: Secondary | ICD-10-CM | POA: Diagnosis not present

## 2023-04-19 DIAGNOSIS — E1065 Type 1 diabetes mellitus with hyperglycemia: Secondary | ICD-10-CM | POA: Insufficient documentation

## 2023-04-19 DIAGNOSIS — E1149 Type 2 diabetes mellitus with other diabetic neurological complication: Secondary | ICD-10-CM | POA: Diagnosis present

## 2023-04-19 DIAGNOSIS — R339 Retention of urine, unspecified: Secondary | ICD-10-CM | POA: Diagnosis not present

## 2023-04-19 DIAGNOSIS — N1330 Unspecified hydronephrosis: Secondary | ICD-10-CM | POA: Diagnosis not present

## 2023-04-19 DIAGNOSIS — E785 Hyperlipidemia, unspecified: Secondary | ICD-10-CM | POA: Diagnosis present

## 2023-04-19 DIAGNOSIS — I509 Heart failure, unspecified: Secondary | ICD-10-CM | POA: Diagnosis not present

## 2023-04-19 DIAGNOSIS — R0681 Apnea, not elsewhere classified: Secondary | ICD-10-CM | POA: Diagnosis not present

## 2023-04-19 DIAGNOSIS — R739 Hyperglycemia, unspecified: Secondary | ICD-10-CM | POA: Diagnosis not present

## 2023-04-19 DIAGNOSIS — E039 Hypothyroidism, unspecified: Secondary | ICD-10-CM | POA: Diagnosis not present

## 2023-04-19 DIAGNOSIS — R000 Tachycardia, unspecified: Secondary | ICD-10-CM | POA: Diagnosis not present

## 2023-04-19 DIAGNOSIS — R112 Nausea with vomiting, unspecified: Secondary | ICD-10-CM | POA: Diagnosis not present

## 2023-04-19 DIAGNOSIS — N182 Chronic kidney disease, stage 2 (mild): Secondary | ICD-10-CM | POA: Diagnosis not present

## 2023-04-19 DIAGNOSIS — Z7982 Long term (current) use of aspirin: Secondary | ICD-10-CM | POA: Insufficient documentation

## 2023-04-19 DIAGNOSIS — I480 Paroxysmal atrial fibrillation: Secondary | ICD-10-CM | POA: Diagnosis present

## 2023-04-19 DIAGNOSIS — Z79899 Other long term (current) drug therapy: Secondary | ICD-10-CM | POA: Insufficient documentation

## 2023-04-19 DIAGNOSIS — R1084 Generalized abdominal pain: Secondary | ICD-10-CM | POA: Diagnosis not present

## 2023-04-19 DIAGNOSIS — Z87891 Personal history of nicotine dependence: Secondary | ICD-10-CM | POA: Diagnosis not present

## 2023-04-19 DIAGNOSIS — I130 Hypertensive heart and chronic kidney disease with heart failure and stage 1 through stage 4 chronic kidney disease, or unspecified chronic kidney disease: Secondary | ICD-10-CM | POA: Diagnosis not present

## 2023-04-19 DIAGNOSIS — E1165 Type 2 diabetes mellitus with hyperglycemia: Secondary | ICD-10-CM | POA: Diagnosis not present

## 2023-04-19 DIAGNOSIS — R110 Nausea: Secondary | ICD-10-CM

## 2023-04-19 DIAGNOSIS — R338 Other retention of urine: Secondary | ICD-10-CM | POA: Insufficient documentation

## 2023-04-19 DIAGNOSIS — I428 Other cardiomyopathies: Secondary | ICD-10-CM

## 2023-04-19 LAB — COMPREHENSIVE METABOLIC PANEL
ALT: 14 U/L (ref 0–44)
AST: 12 U/L — ABNORMAL LOW (ref 15–41)
Albumin: 4 g/dL (ref 3.5–5.0)
Alkaline Phosphatase: 67 U/L (ref 38–126)
Anion gap: 14 (ref 5–15)
BUN: 14 mg/dL (ref 6–20)
CO2: 23 mmol/L (ref 22–32)
Calcium: 8.5 mg/dL — ABNORMAL LOW (ref 8.9–10.3)
Chloride: 96 mmol/L — ABNORMAL LOW (ref 98–111)
Creatinine, Ser: 0.62 mg/dL (ref 0.44–1.00)
GFR, Estimated: >60 mL/min (ref >60)
Glucose, Bld: 457 mg/dL — ABNORMAL HIGH (ref 70–99)
Potassium: 3.5 mmol/L (ref 3.5–5.1)
Sodium: 133 mmol/L — ABNORMAL LOW (ref 135–145)
Total Bilirubin: 0.9 mg/dL (ref 0.3–1.2)
Total Protein: 7.2 g/dL (ref 6.5–8.1)

## 2023-04-19 LAB — CBC
HCT: 45.9 % (ref 36.0–46.0)
Hemoglobin: 15.4 g/dL — ABNORMAL HIGH (ref 12.0–15.0)
MCH: 28.8 pg (ref 26.0–34.0)
MCHC: 33.6 g/dL (ref 30.0–36.0)
MCV: 86 fL (ref 80.0–100.0)
Platelets: 432 10*3/uL — ABNORMAL HIGH (ref 150–400)
RBC: 5.34 MIL/uL — ABNORMAL HIGH (ref 3.87–5.11)
RDW: 15.2 % (ref 11.5–15.5)
WBC: 13 10*3/uL — ABNORMAL HIGH (ref 4.0–10.5)
nRBC: 0 % (ref 0.0–0.2)

## 2023-04-19 LAB — URINALYSIS, ROUTINE W REFLEX MICROSCOPIC
Bilirubin Urine: NEGATIVE
Glucose, UA: >=500 mg/dL — AB
Hgb urine dipstick: NEGATIVE
Ketones, ur: 5 mg/dL — AB
Leukocytes,Ua: NEGATIVE
Nitrite: NEGATIVE
Protein, ur: NEGATIVE mg/dL
Specific Gravity, Urine: 1.021 (ref 1.005–1.030)
pH: 8 (ref 5.0–8.0)

## 2023-04-19 LAB — GLUCOSE, CAPILLARY
Glucose-Capillary: 238 mg/dL — ABNORMAL HIGH (ref 70–99)
Glucose-Capillary: 67 mg/dL — ABNORMAL LOW (ref 70–99)

## 2023-04-19 LAB — BASIC METABOLIC PANEL
Anion gap: 11 (ref 5–15)
BUN: 11 mg/dL (ref 6–20)
CO2: 22 mmol/L (ref 22–32)
Calcium: 8.6 mg/dL — ABNORMAL LOW (ref 8.9–10.3)
Chloride: 104 mmol/L (ref 98–111)
Creatinine, Ser: 0.49 mg/dL (ref 0.44–1.00)
GFR, Estimated: >60 mL/min (ref >60)
Glucose, Bld: 173 mg/dL — ABNORMAL HIGH (ref 70–99)
Potassium: 3.7 mmol/L (ref 3.5–5.1)
Sodium: 137 mmol/L (ref 135–145)

## 2023-04-19 LAB — BLOOD GAS, VENOUS
Acid-Base Excess: 3.5 mmol/L — ABNORMAL HIGH (ref 0.0–2.0)
Bicarbonate: 27.8 mmol/L (ref 20.0–28.0)
O2 Saturation: 99.3 %
Patient temperature: 37
pCO2, Ven: 40 mmHg — ABNORMAL LOW (ref 44–60)
pH, Ven: 7.45 — ABNORMAL HIGH (ref 7.25–7.43)
pO2, Ven: 164 mmHg — ABNORMAL HIGH (ref 32–45)

## 2023-04-19 LAB — LIPASE, BLOOD: Lipase: 99 U/L — ABNORMAL HIGH (ref 11–51)

## 2023-04-19 LAB — CBG MONITORING, ED
Glucose-Capillary: 461 mg/dL — ABNORMAL HIGH (ref 70–99)
Glucose-Capillary: 379 mg/dL — ABNORMAL HIGH (ref 70–99)

## 2023-04-19 LAB — HIV ANTIBODY (ROUTINE TESTING W REFLEX): HIV Screen 4th Generation wRfx: NONREACTIVE

## 2023-04-19 MED ORDER — ONDANSETRON HCL 4 MG PO TABS: 4.0000 mg | ORAL_TABLET | Freq: Four times a day (QID) | ORAL | Status: DC | PRN

## 2023-04-19 MED ORDER — LACTATED RINGERS IV BOLUS
1000.0000 mL | Freq: Once | INTRAVENOUS | Status: AC
  Administered 2023-04-19: 1000 mL via INTRAVENOUS

## 2023-04-19 MED ORDER — ACETAMINOPHEN 650 MG RE SUPP: 650.0000 mg | Freq: Four times a day (QID) | RECTAL | Status: DC | PRN

## 2023-04-19 MED ORDER — SODIUM CHLORIDE 0.9 % IV SOLN: INTRAVENOUS | Status: DC

## 2023-04-19 MED ORDER — METOCLOPRAMIDE HCL 5 MG/ML IJ SOLN
10.0000 mg | Freq: Once | INTRAMUSCULAR | Status: AC
  Administered 2023-04-19: 10 mg via INTRAVENOUS
  Filled 2023-04-19: qty 2

## 2023-04-19 MED ORDER — ENOXAPARIN SODIUM 40 MG/0.4ML IJ SOSY
40.0000 mg | PREFILLED_SYRINGE | INTRAMUSCULAR | Status: DC
  Administered 2023-04-19 – 2023-04-20 (×2): 40 mg via SUBCUTANEOUS
  Filled 2023-04-19 (×2): qty 0.4

## 2023-04-19 MED ORDER — DOCUSATE SODIUM 100 MG PO CAPS
100.0000 mg | ORAL_CAPSULE | Freq: Two times a day (BID) | ORAL | Status: DC
  Administered 2023-04-19: 100 mg via ORAL
  Filled 2023-04-19: qty 1

## 2023-04-19 MED ORDER — SODIUM CHLORIDE 0.9 % IV SOLN
12.5000 mg | Freq: Once | INTRAVENOUS | Status: AC
  Administered 2023-04-19: 12.5 mg via INTRAVENOUS
  Filled 2023-04-19: qty 12.5

## 2023-04-19 MED ORDER — ASPIRIN 81 MG PO TBEC
81.0000 mg | DELAYED_RELEASE_TABLET | Freq: Every day | ORAL | Status: DC
  Administered 2023-04-20 – 2023-04-21 (×2): 81 mg via ORAL
  Filled 2023-04-19 (×2): qty 1

## 2023-04-19 MED ORDER — INSULIN ASPART 100 UNIT/ML IJ SOLN
0.0000 [IU] | INTRAMUSCULAR | Status: DC
  Administered 2023-04-19 – 2023-04-20 (×2): 5 [IU] via SUBCUTANEOUS
  Filled 2023-04-19: qty 0.15

## 2023-04-19 MED ORDER — PANTOPRAZOLE SODIUM 40 MG IV SOLR
40.0000 mg | INTRAVENOUS | Status: DC
  Administered 2023-04-20 – 2023-04-21 (×2): 40 mg via INTRAVENOUS
  Filled 2023-04-19 (×2): qty 10

## 2023-04-19 MED ORDER — SODIUM CHLORIDE (PF) 0.9 % IJ SOLN
INTRAMUSCULAR | Status: AC
  Filled 2023-04-19: qty 50

## 2023-04-19 MED ORDER — ONDANSETRON HCL 4 MG/2ML IJ SOLN: 4.0000 mg | Freq: Four times a day (QID) | INTRAMUSCULAR | Status: DC | PRN

## 2023-04-19 MED ORDER — DROPERIDOL 2.5 MG/ML IJ SOLN
2.5000 mg | Freq: Once | INTRAMUSCULAR | Status: AC
  Administered 2023-04-19: 2.5 mg via INTRAVENOUS
  Filled 2023-04-19: qty 2

## 2023-04-19 MED ORDER — ALBUTEROL SULFATE (2.5 MG/3ML) 0.083% IN NEBU: 2.5000 mg | INHALATION_SOLUTION | RESPIRATORY_TRACT | Status: DC | PRN

## 2023-04-19 MED ORDER — TRAZODONE HCL 100 MG PO TABS: 100.0000 mg | ORAL_TABLET | Freq: Every evening | ORAL | Status: DC | PRN

## 2023-04-19 MED ORDER — SODIUM CHLORIDE 0.9 % IV SOLN: 12.5000 mg | Freq: Four times a day (QID) | INTRAVENOUS | Status: DC | PRN

## 2023-04-19 MED ORDER — ACETAMINOPHEN 325 MG PO TABS: 650.0000 mg | ORAL_TABLET | Freq: Four times a day (QID) | ORAL | Status: DC | PRN

## 2023-04-19 MED ORDER — INSULIN GLARGINE-YFGN 100 UNIT/ML ~~LOC~~ SOLN
6.0000 [IU] | Freq: Every day | SUBCUTANEOUS | Status: DC
  Administered 2023-04-19 – 2023-04-20 (×2): 6 [IU] via SUBCUTANEOUS
  Filled 2023-04-19 (×3): qty 0.06

## 2023-04-19 MED ORDER — ATORVASTATIN CALCIUM 40 MG PO TABS
80.0000 mg | ORAL_TABLET | Freq: Every day | ORAL | Status: DC
  Administered 2023-04-20 – 2023-04-21 (×2): 80 mg via ORAL
  Filled 2023-04-19 (×2): qty 2

## 2023-04-19 MED ORDER — METOPROLOL TARTRATE 5 MG/5ML IV SOLN: 5.0000 mg | Freq: Four times a day (QID) | INTRAVENOUS | Status: DC | PRN

## 2023-04-19 MED ORDER — INSULIN ASPART 100 UNIT/ML IJ SOLN
10.0000 [IU] | Freq: Once | INTRAMUSCULAR | Status: AC
  Administered 2023-04-19: 10 [IU] via SUBCUTANEOUS
  Filled 2023-04-19: qty 0.1

## 2023-04-19 MED ORDER — POLYETHYLENE GLYCOL 3350 17 G PO PACK: 17.0000 g | PACK | Freq: Every day | ORAL | Status: DC | PRN

## 2023-04-19 MED ORDER — FAMOTIDINE IN NACL 20-0.9 MG/50ML-% IV SOLN
20.0000 mg | Freq: Once | INTRAVENOUS | Status: AC
  Administered 2023-04-19: 20 mg via INTRAVENOUS
  Filled 2023-04-19: qty 50

## 2023-04-19 MED ORDER — INSULIN GLARGINE (1 UNIT DIAL) 300 UNIT/ML ~~LOC~~ SOPN: 6.0000 [IU] | PEN_INJECTOR | Freq: Every day | SUBCUTANEOUS | Status: DC

## 2023-04-19 MED ORDER — IOHEXOL 300 MG/ML  SOLN
100.0000 mL | Freq: Once | INTRAMUSCULAR | Status: AC | PRN
  Administered 2023-04-19: 100 mL via INTRAVENOUS

## 2023-04-19 NOTE — ED Notes (Signed)
Patient transported to CT 

## 2023-04-19 NOTE — ED Triage Notes (Signed)
Per EMS, Pt, from home, c/o polyuria and dysuria x2 days and generalized abdominal pain and n/v starting last night.  Pain score 8/10.  Pt switched from Humalog to Novolog yesterday.  Pt recently finished antibiotics for a UTI.  Pt given 4mg  Zofran, Fentanyl, and NS en route.    CBG 494

## 2023-04-19 NOTE — H&P (Signed)
History and Physical  [name] [name] YNW:[id number] DOB: July 15, 1961 DOA: 04/19/2023  PCP: Everardo All, NP   Chief Complaint: Nausea vomiting  HPI: [name] [name] is a 62 y.o. female with medical history significant for insulin-dependent type 1 diabetes poorly controlled, hypothyroidism, history of urinary retention and intractable nausea and vomiting being admitted to the hospital with intractable nausea vomiting and urinary retention.  The patient was actually admitted to our hospitalist service 2/24 to 2/28 of this year for DKA and intractable nausea and vomiting.  During that hospital stay, she had upper endoscopy which showed reflux esophagitis, she has a known history of gastroparesis.  Patient states that she started having abdominal pain with nausea and vomiting yesterday at about midnight.  Denies any fevers or chills, any blood in stool or emesis.  Denies any diarrhea.  Says she has been urinating a lot, but also feels like she cannot empty her bladder.  ED Course: In the emergency department, she is tachycardic and hypertensive, continues to be uncomfortable due to bladder distention and intractable nausea.  Lab work shows WBC 13,000, hemoglobin 15, platelets 432, normal renal function, blood glucose 461 normal anion gap.  She was given IV fluids, 10 units of insulin as well as IV Reglan, IV Phenergan, and droperidol despite which her nausea continues.  Review of Systems: Please see HPI for pertinent positives and negatives. A complete 10 system review of systems are otherwise negative.  Past Medical History:  Diagnosis Date   Allergy    seasonal   Anxiety    B12 deficiency    Chest pain 04/05/2017   CHF (congestive heart failure) ([name])    Diabetes mellitus type 1, uncontrolled    "dr. just changed me to type 1, uncontrolled" (11/26/2018)   DKA (diabetic ketoacidoses) 05/25/2018   GERD (gastroesophageal reflux disease)    Hyperlipidemia    Hypertension    Hypothyroidism     IBS (irritable bowel syndrome)    Intermittent vertigo    Kidney disease, chronic, stage 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) 10/29/2013   Leg cramping    "@ night" (09/18/2013)   Migraine    "once q couple months" (11/26/2018)   Osteoporosis    Peripheral neuropathy    Umbilical hernia    unrepaired (09/18/2013)   Past Surgical History:  Procedure Laterality Date   BIOPSY  06/03/2019   Procedure: BIOPSY;  Surgeon: Lynann Bologna, MD;  Location: [facility];  Service: Endoscopy;;   BIOPSY  08/17/2020   Procedure: BIOPSY;  Surgeon: Tressia Danas, MD;  Location: [facility];  Service: Gastroenterology;;   BIOPSY  02/05/2023   Procedure: BIOPSY;  Surgeon: Meryl Dare, MD;  Location: [facility];  Service: Gastroenterology;;   CESAREAN SECTION  1989   ENTEROSCOPY N/A 06/03/2019   Procedure: ENTEROSCOPY;  Surgeon: Lynann Bologna, MD;  Location: [facility];  Service: Endoscopy;  Laterality: N/A;   ESOPHAGOGASTRODUODENOSCOPY (EGD) WITH PROPOFOL N/A 08/17/2020   Procedure: ESOPHAGOGASTRODUODENOSCOPY (EGD) WITH PROPOFOL;  Surgeon: Tressia Danas, MD;  Location: [facility];  Service: Gastroenterology;  Laterality: N/A;   ESOPHAGOGASTRODUODENOSCOPY (EGD) WITH PROPOFOL N/A 02/05/2023   Procedure: ESOPHAGOGASTRODUODENOSCOPY (EGD) WITH PROPOFOL;  Surgeon: Meryl Dare, MD;  Location: [facility];  Service: Gastroenterology;  Laterality: N/A;   LAPAROSCOPIC ASSISTED VAGINAL HYSTERECTOMY  09/23/2012   Procedure: LAPAROSCOPIC ASSISTED VAGINAL HYSTERECTOMY;  Surgeon: Mitchel Honour, DO;  Location: [facility];  Service: Gynecology;  Laterality: N/A;  pull Dr Vance Gather instrument   LEFT HEART CATH AND CORONARY ANGIOGRAPHY N/A 02/11/2017  Procedure: Left Heart Cath and Coronary Angiography;  Surgeon: Runell Gess, MD;  Location: [facility] INVASIVE CV LAB;  Service: Cardiovascular;  Laterality: N/A;   TUBAL LIGATION Bilateral 1992    Social History:  reports that she quit smoking about 32 years ago. Her smoking use  included cigarettes. She has a 1.20 pack-year smoking history. She has never used smokeless tobacco. She reports that she does not currently use drugs after having used the following drugs: Marijuana. She reports that she does not drink alcohol.   Allergies  Allergen Reactions   Codeine Hives, Anxiety and Other (See Comments)    Family History  Problem Relation Age of Onset   Diabetes Other    Heart disease Other    Heart failure Mother 35       Her heart skipped a beat and stopped - per pt   Diabetes Maternal Grandmother    Alzheimer's disease Maternal Grandmother    Coronary artery disease Maternal Grandmother    Heart attack Maternal Grandfather    Heart failure Brother    Diabetes Brother    Colon polyps Neg Hx    Esophageal cancer Neg Hx    Liver cancer Neg Hx    Pancreatic cancer Neg Hx    Stomach cancer Neg Hx    Crohn's disease Neg Hx    Rectal cancer Neg Hx      Prior to Admission medications   Medication Sig Start Date End Date Taking? Authorizing Provider  aspirin EC 81 MG tablet Take 81 mg by mouth daily. Swallow whole.    [provider]  atorvastatin (LIPITOR) 80 MG tablet Take 1 tablet (80 mg total) by mouth daily. 01/12/22   Bedsole, Amy E, MD  cephALEXin (KEFLEX) 500 MG capsule Take 1 capsule (500 mg total) by mouth 4 (four) times daily. 03/29/23   Lorre Nick, MD  Continuous Blood Gluc Sensor (DEXCOM G6 SENSOR) MISC INJECT 1 SENSOR INTO SKIN EVERY 10 DAYS 07/18/22   Bedsole, Amy E, MD  Continuous Blood Gluc Transmit (DEXCOM G6 TRANSMITTER) MISC USE AS DIRECTED FOR CONTINUOUS GLUCOSE MONITORING. REUSE TRANSMITTER X 90DAYS THEN DISCARD & REPLACE 07/05/21   Bedsole, Amy E, MD  eletriptan (RELPAX) 40 MG tablet Take 1 tablet (40 mg total) by mouth as needed for migraine or headache. May repeat in 2 hours if headache persists or recurs. Patient taking differently: Take 40 mg by mouth every 2 (two) hours as needed for migraine or headache. 08/14/22   Bedsole, Amy  E, MD  insulin lispro (HUMALOG KWIKPEN) 100 UNIT/ML KwikPen INJECT INTO SKIN 3 TIMES A DAY AT MEAL TIME ACCORDING TO THE FOLLOWING SLIDING SCALE: 27 UNITS MAX PER DAY DX E11.43 Patient taking differently: Inject 1-4 Units into the skin with breakfast, with lunch, and with evening meal. Per sliding scale. 02/26/22   Bedsole, Amy E, MD  Insulin Pen Needle (PEN NEEDLES) 31G X 8 MM MISC Use to inject insulin 4 times a day.  Dx: E11.10 07/21/21   Excell Seltzer, MD  Lancets (ONETOUCH ULTRASOFT) lancets Use to check blood sugar daily as needed.  Dx: E11.10 12/23/17   Excell Seltzer, MD  levothyroxine (SYNTHROID) 137 MCG tablet Take 959 mcg by mouth every Sunday. 03/19/22   [provider]  metoCLOPramide (REGLAN) 5 MG tablet Take 1 tablet (5 mg total) by mouth 3 (three) times daily before meals. 02/06/23   Albertine Grates, MD  midodrine (PROAMATINE) 5 MG tablet Take 1 tablet (5 mg total) by  mouth 2 (two) times daily with a meal. Hold if your systolic blood pressure  ( top number) is > , please check your blood pressure at home once a day and bring in record for your doctor to review, follow up with your pcp and cardiology regarding your blood pressure medication 02/06/23   Albertine Grates, MD  pantoprazole (PROTONIX) 40 MG tablet Take 1 tablet (40 mg total) by mouth 2 (two) times daily. 02/06/23 03/08/23  Albertine Grates, MD  promethazine (PHENERGAN) 25 MG tablet Take 1 tablet (25 mg total) by mouth every 8 (eight) hours as needed for nausea or vomiting. 01/05/22   Bedsole, Amy E, MD  TOUJEO SOLOSTAR 300 UNIT/ML Solostar Pen Inject 6 Units into the skin at bedtime. 03/29/22   Regalado, Belkys A, MD  traZODone (DESYREL) 50 MG tablet Take 100 mg by mouth at bedtime as needed for sleep. 02/01/23   [provider]    Physical Exam: BP (!) 170/98   Pulse (!) 110   Temp 98.5 F (36.9 C) (Oral)   Resp (!) 23   Ht 4\' 11"  (1.499 m)   Wt 52.2 kg   LMP 08/11/2012   SpO2 96%   BMI 23.23 kg/m   General: Patient is  alert oriented, looks uncomfortable but not acutely ill.  Holding emesis bag. Eyes: EOMI, clear conjuctivae, white sclerea Neck: supple, no masses, trachea mildline  Cardiovascular: RRR, no murmurs or rubs, no peripheral edema  Respiratory: clear to auscultation bilaterally, no wheezes, no crackles  Abdomen: soft, diffusely tender, nondistended, palpable distended bladder  skin: dry, no rashes  Musculoskeletal: no joint effusions, normal range of motion  Psychiatric: appropriate affect, normal speech  Neurologic: extraocular muscles intact, clear speech, moving all extremities with intact sensorium          Labs on Admission:  Basic Metabolic Panel: Recent Labs  Lab 04/19/23 0805  NA 133*  K 3.5  CL 96*  CO2 23  GLUCOSE 457*  BUN 14  CREATININE 0.62  CALCIUM 8.5*   Liver Function Tests: Recent Labs  Lab 04/19/23 0805  AST 12*  ALT 14  ALKPHOS 67  BILITOT 0.9  PROT 7.2  ALBUMIN 4.0   Recent Labs  Lab 04/19/23 0805  LIPASE 99*   No results for input(s): "AMMONIA" in the last 168 hours. CBC: Recent Labs  Lab 04/19/23 0805  WBC 13.0*  HGB 15.4*  HCT 45.9  MCV 86.0  PLT 432*   Cardiac Enzymes: No results for input(s): "CKTOTAL", "CKMB", "CKMBINDEX", "TROPONINI" in the last 168 hours.  BNP (last 3 results) No results for input(s): "BNP" in the last 8760 hours.  ProBNP (last 3 results) No results for input(s): "PROBNP" in the last 8760 hours.  CBG: Recent Labs  Lab 04/19/23 0802 04/19/23 1140  GLUCAP 461* 379*   Radiological Exams on Admission: This is a chronically ill 62 year old Caucasian female with a history of poorly controlled type 1 diabetes being admitted to the hospital with hyperglycemia, intractable nausea vomiting and urinary retention.  She does have a history of diabetes induced nerve dysfunction including gastroparesis and likely urinary retention. -Observation admission -IV fluids -Pain and nausea medication as needed, with IV  Phenergan and IV Zofran available -Clear liquid diet for now, to advance as tolerated  Type 2 diabetes mellitus with neurological complications ([name]) -Currently hyperglycemic without evidence of ketoacidosis -Will advance to diabetic diet as tolerated, as her nausea improves -Continue Toujeo at reduced dose of 6 units at bedtime, home dose  is 12 units -Moderate dose sliding scale in addition to basal insulin as mentioned above  Urinary retention-likely due to her diabetes, she does have a history of urinary retention in the past, had Foley catheter for short time in January. -Place Foley catheter now -Consider trial of void in the morning -Would likely benefit from outpatient urology follow-up  Insomnia-trazodone as needed at bedtime    Gastritis-IV PPI   Hypothyroidism-patient on weekly Synthroid at home, per endocrinology   Hyperlipidemia-continue home Lipitor   Kidney disease, chronic, stage 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)   Paroxysmal A-fib ([name])   Nonischemic cardiomyopathy ([name])  DVT prophylaxis: Lovenox     Code Status: Full Code  Consults called: None  Admission status: Observation  Time spent: 68 minutes  Zakeya Junker Sharlette Dense MD [facility] Pager [phone]  If 7PM-7AM, please contact night-coverage [email or web address] Password [facility]  04/19/2023, 2:44 PM

## 2023-04-19 NOTE — ED Notes (Signed)
ED TO INPATIENT HANDOFF REPORT  ED Nurse Name and Phone #: Lenis Dickinson [phone]  S Name/Age/Gender [name] [name] 62 y.o. female Room/Bed: WA15/WA15  Code Status   Code Status: Full Code  Home/SNF/Other Home Patient oriented to: self, place, time, and situation Is this baseline? Yes   Triage Complete: Triage complete  Chief Complaint Intractable vomiting with nausea [R11.2]  Triage Note Per EMS, Pt, from home, c/o polyuria and dysuria x2 days and generalized abdominal pain and n/v starting last night.  Pain score 8/10.  Pt switched from Humalog to Novolog yesterday.  Pt recently finished antibiotics for a UTI.  Pt given 4mg  Zofran, Fentanyl, and NS en route.    CBG 494   Allergies Allergies  Allergen Reactions   Codeine Hives, Anxiety and Other (See Comments)    Level of Care/Admitting Diagnosis ED Disposition     ED Disposition  Admit   Condition  --   Comment  Hospital Area: [facility] [[id number]]  Level of Care: Progressive [102]  Admit to Progressive based on following criteria: CARDIOVASCULAR & THORACIC of moderate stability with acute coronary syndrome symptoms/low risk myocardial infarction/hypertensive urgency/arrhythmias/heart failure potentially compromising stability and stable post cardiovascular intervention patients.  May place patient in observation at [facility] or [facility] if equivalent level of care is available:: Yes  Covid Evaluation: Asymptomatic - no recent exposure (last 10 days) testing not required  Diagnosis: Intractable vomiting with nausea [[id number]]  Admitting Physician: Maryln Gottron [[id number]]  Attending Physician: Kirby Crigler, Parks Neptune [[id number]]          B Medical/Surgery History Past Medical History:  Diagnosis Date   Allergy    seasonal   Anxiety    B12 deficiency    Chest pain 04/05/2017   CHF (congestive heart failure) ([name])    Diabetes mellitus type 1, uncontrolled    "dr. 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) 10/29/2013   Leg cramping    "@ night" (09/18/2013)   Migraine    "once q couple months" (11/26/2018)   Osteoporosis    Peripheral neuropathy    Umbilical hernia    unrepaired (09/18/2013)   Past Surgical History:  Procedure Laterality Date   BIOPSY  06/03/2019   Procedure: BIOPSY;  Surgeon: Lynann Bologna, MD;  Location: [facility];  Service: Endoscopy;;   BIOPSY  08/17/2020   Procedure: BIOPSY;  Surgeon: Tressia Danas, MD;  Location: [facility];  Service: Gastroenterology;;   BIOPSY  02/05/2023   Procedure: BIOPSY;  Surgeon: Meryl Dare, MD;  Location: [facility];  Service: Gastroenterology;;   CESAREAN SECTION  1989   ENTEROSCOPY N/A 06/03/2019   Procedure: ENTEROSCOPY;  Surgeon: Lynann Bologna, MD;  Location: [facility];  Service: Endoscopy;  Laterality: N/A;   ESOPHAGOGASTRODUODENOSCOPY (EGD) WITH PROPOFOL N/A 08/17/2020   Procedure: ESOPHAGOGASTRODUODENOSCOPY (EGD) WITH PROPOFOL;  Surgeon: Tressia Danas, MD;  Location: [facility];  Service: Gastroenterology;  Laterality: N/A;   ESOPHAGOGASTRODUODENOSCOPY (EGD) WITH PROPOFOL N/A 02/05/2023   Procedure: ESOPHAGOGASTRODUODENOSCOPY (EGD) WITH PROPOFOL;  Surgeon: Meryl Dare, MD;  Location: [facility];  Service: Gastroenterology;  Laterality: N/A;   LAPAROSCOPIC ASSISTED VAGINAL HYSTERECTOMY  09/23/2012   Procedure: LAPAROSCOPIC ASSISTED VAGINAL HYSTERECTOMY;  Surgeon: Mitchel Honour, DO;  Location: [facility];  Service: Gynecology;  Laterality: N/A;  pull Dr Vance Gather instrument   LEFT HEART CATH AND CORONARY ANGIOGRAPHY N/A 02/11/2017   Procedure: Left Heart Cath and Coronary Angiography;  Surgeon: Runell Gess, MD;  Location: [facility]  INVASIVE CV LAB;  Service: Cardiovascular;  Laterality: N/A;   TUBAL LIGATION Bilateral 1992     A IV Location/Drains/Wounds Patient Lines/Drains/Airways Status     Active Line/Drains/Airways     Name Placement date Placement time Site Days   Peripheral IV 04/19/23 20 G Left;Posterior Hand 04/19/23  0756  Hand  less than 1   Wound / Incision (Open or Dehisced) 01/06/23 Laceration Toe (Comment  which one) Anterior;Right injury 01/06/23  2100  Toe (Comment  which one)  103            Intake/Output Last 24 hours  Intake/Output Summary (Last 24 hours) at 04/19/2023 1453 Last data filed at 04/19/2023 6578 Gross per 24 hour  Intake --  Output 500 ml  Net -500 ml    Labs/Imaging Results for orders placed or performed during the hospital encounter of 04/19/23 (from the past 48 hour(s))  CBG monitoring, ED     Status: Abnormal   Collection Time: 04/19/23  8:02 AM  Result Value Ref Range   Glucose-Capillary 461 (H) 70 - 99 mg/dL    Comment: Glucose reference range applies only to samples taken after fasting for at least 8 hours.  Lipase, blood     Status: Abnormal   Collection Time: 04/19/23  8:05 AM  Result Value Ref Range   Lipase 99 (H) 11 - 51 U/L    Comment: Performed at [facility], [address] [place], [place] [address]  Comprehensive metabolic panel     Status: Abnormal   Collection Time: 04/19/23  8:05 AM  Result Value Ref Range   Sodium 133 (L) 135 - 145 mmol/L   Potassium 3.5 3.5 - 5.1 mmol/L   Chloride 96 (L) 98 - 111 mmol/L   CO2 23 22 - 32 mmol/L   Glucose, Bld 457 (H) 70 - 99 mg/dL    Comment: Glucose reference range applies only to samples taken after fasting for at least 8 hours.   BUN 14 6 - 20 mg/dL   Creatinine, Ser 9.52 0.44 - 1.00 mg/dL   Calcium 8.5 (L) 8.9 - 10.3 mg/dL   Total Protein 7.2 6.5 - 8.1 g/dL   Albumin 4.0 3.5 - 5.0 g/dL   AST 12 (L) 15 - 41 U/L   ALT 14 0 - 44 U/L   Alkaline Phosphatase 67 38 - 126 U/L   Total  Bilirubin 0.9 0.3 - 1.2 mg/dL   GFR, Estimated >84 >13 mL/min    Comment: (NOTE) Calculated using the CKD-EPI Creatinine Equation (2021)    Anion gap 14 5 - 15    Comment: Performed at [facility], [address] [place], [place] [address]  CBC     Status: Abnormal   Collection Time: 04/19/23  8:05 AM  Result Value Ref Range   WBC 13.0 (H) 4.0 - 10.5 K/uL   RBC 5.34 (H) 3.87 - 5.11 MIL/uL   Hemoglobin 15.4 (H) 12.0 - 15.0 g/dL   HCT 02.7 25.3 - 66.4 %   MCV 86.0 80.0 - 100.0 fL   MCH 28.8 26.0 - 34.0 pg   MCHC 33.6 30.0 - 36.0 g/dL   RDW 40.3 47.4 - 25.9 %  Platelets 432 (H) 150 - 400 K/uL   nRBC 0.0 0.0 - 0.2 %    Comment: Performed at [facility], [address], [place], [place] [address]  Blood gas, venous (at [facility] and [facility])     Status: Abnormal   Collection Time: 04/19/23  8:05 AM  Result Value Ref Range   pH, Ven 7.45 (H) 7.25 - 7.43   pCO2, Ven 40 (L) 44 - 60 mmHg   pO2, Ven 164 (H) 32 - 45 mmHg   Bicarbonate 27.8 20.0 - 28.0 mmol/L   Acid-Base Excess 3.5 (H) 0.0 - 2.0 mmol/L   O2 Saturation 99.3 %   Patient temperature 37.0     Comment: Performed at [facility], [address], [place], [place] [address]  Urinalysis, Routine w reflex microscopic -Urine, Clean Catch     Status: Abnormal   Collection Time: 04/19/23  9:45 AM  Result Value Ref Range   Color, Urine COLORLESS (A) YELLOW   APPearance CLEAR CLEAR   Specific Gravity, Urine 1.021 1.005 - 1.030   pH 8.0 5.0 - 8.0   Glucose, UA >=500 (A) NEGATIVE mg/dL   Hgb urine dipstick NEGATIVE NEGATIVE   Bilirubin Urine NEGATIVE NEGATIVE   Ketones, ur 5 (A) NEGATIVE mg/dL   Protein, ur NEGATIVE NEGATIVE mg/dL   Nitrite NEGATIVE NEGATIVE   Leukocytes,Ua NEGATIVE NEGATIVE   RBC / HPF 0-5 0 - 5 RBC/hpf   WBC, UA 0-5 0 - 5 WBC/hpf   Bacteria, UA RARE (A) NONE SEEN   Squamous Epithelial / HPF 0-5 0 - 5 /HPF    Comment: Performed at [facility],  [address], [place], [place] [address]  CBG monitoring, ED     Status: Abnormal   Collection Time: 04/19/23 11:40 AM  Result Value Ref Range   Glucose-Capillary 379 (H) 70 - 99 mg/dL    Comment: Glucose reference range applies only to samples taken after fasting for at least 8 hours.   CT ABDOMEN PELVIS W CONTRAST  Result Date: 04/19/2023 CLINICAL DATA:  Polyuria and dysuria for 2 days, generalized abdominal pain, nausea, vomiting. EXAM: CT ABDOMEN AND PELVIS WITH CONTRAST TECHNIQUE: Multidetector CT imaging of the abdomen and pelvis was performed using the standard protocol following bolus administration of intravenous contrast. RADIATION DOSE REDUCTION: This exam was performed according to the departmental dose-optimization program which includes automated exposure control, adjustment of the mA and/or kV according to patient size and/or use of iterative reconstruction technique. CONTRAST:  OMNIPAQUE IOHEXOL 300 MG/ML  SOLN COMPARISON:  CT abdomen/pelvis most recently 03/29/2023 FINDINGS: Lower chest: The lung bases are clear. The imaged heart is unremarkable. Hepatobiliary: The previously seen enhancing lesion in hepatic segment II is not identified on the current study. The liver is diffusely hypoattenuating suggesting fatty infiltration. There are no focal lesions on the current study. Nodular enhancement of the gallbladder fundus is unchanged since 2021 and likely reflects adenomyomatosis. The gallbladder is otherwise unremarkable. There is no biliary ductal dilatation. Pancreas: Unremarkable. Spleen: Unremarkable. Adrenals/Urinary Tract: The adrenals are unremarkable. There is mild hydroureteronephrosis due to a markedly distended urinary bladder, similar to prior studies. There are no focal parenchymal lesions. There is no stone in either kidney or along the course of either ureter. There is excretion of contrast into the collecting systems on the delayed images. Stomach/Bowel: Mild distal  esophageal wall thickening is again seen which may reflect esophagitis. The gastric mucosa is hyperenhancing. A small superiorly directed outpouching  arising from the third portion of the duodenum is unchanged going back to 2022 and is consistent with a small diverticulum (2-66). There is no evidence of bowel obstruction. There is no abnormal bowel wall thickening or inflammatory change. There is colonic diverticulosis without evidence of acute diverticulitis. The appendix is normal. Vascular/Lymphatic: The abdominal aorta is normal in course and caliber with scattered calcified plaque. The major branch vessels are patent. The main portal and splenic veins are patent. There is no abdominal or pelvic lymphadenopathy. Reproductive: The uterus is surgically absent. There is no adnexal mass. Other: There is no ascites or free air. A small fat containing umbilical hernia is unchanged. Musculoskeletal: There is no acute osseous abnormality or suspicious osseous lesion. IMPRESSION: 1. Markedly distended urinary bladder with mild bilateral hydroureteronephrosis, similar to prior studies. Correlate for retention and consider decompression. 2. Hyperenhancing gastric mucosa could reflect gastritis. Mild distal esophageal wall thickening is similar to prior studies and may reflect gastritis. 3. Hepatic steatosis. The previously seen hyperenhancing lesion in the liver is not seen on the current study. 4. Diverticulosis. Electronically Signed   By: Lesia Hausen M.D.   On: 04/19/2023 10:03    Pending Labs Unresulted Labs (From admission, onward)     Start     Ordered   04/20/23 0500  Basic metabolic panel  Tomorrow morning,   R        04/19/23 1442   04/20/23 0500  CBC  Tomorrow morning,   R        04/19/23 1442   04/19/23 1441  HIV Antibody (routine testing w rflx)  (HIV Antibody (Routine testing w reflex) panel)  Once,   R        04/19/23 1442   04/19/23 1439  Basic metabolic panel  ONCE - STAT,   STAT         04/19/23 1438            Vitals/Pain Today's Vitals   04/19/23 1200 04/19/23 1215 04/19/23 1245 04/19/23 1330  BP: (!) 164/97  (!) 160/110 (!) 170/98  Pulse: (!) 107 (!) 121 (!) 113 (!) 110  Resp: 18 10 15  (!) 23  Temp:      TempSrc:      SpO2: 98% 99% 97% 96%  Weight:      Height:      PainSc:        Isolation Precautions No active isolations  Medications Medications  insulin aspart (novoLOG) injection 0-15 Units (has no administration in time range)  aspirin EC tablet 81 mg (has no administration in time range)  atorvastatin (LIPITOR) tablet 80 mg (has no administration in time range)  traZODone (DESYREL) tablet 100 mg (has no administration in time range)  insulin glargine (1 Unit Dial) (TOUJEO) Solostar Pen SOPN 6 Units (has no administration in time range)  promethazine (PHENERGAN) 12.5 mg in sodium chloride 0.9 % 50 mL IVPB (has no administration in time range)  enoxaparin (LOVENOX) injection 40 mg (has no administration in time range)  acetaminophen (TYLENOL) tablet 650 mg (has no administration in time range)    Or  acetaminophen (TYLENOL) suppository 650 mg (has no administration in time range)  0.9 %  sodium chloride infusion (has no administration in time range)  docusate sodium (COLACE) capsule 100 mg (has no administration in time range)  polyethylene glycol (MIRALAX / GLYCOLAX) packet 17 g (has no administration in time range)  ondansetron (ZOFRAN) tablet 4 mg (has no administration in time range)  Or  ondansetron (ZOFRAN) injection 4 mg (has no administration in time range)  albuterol (PROVENTIL) (2.5 MG/3ML) 0.083% nebulizer solution 2.5 mg (has no administration in time range)  metoprolol tartrate (LOPRESSOR) injection 5 mg (has no administration in time range)  lactated ringers bolus 1,000 mL (0 mLs Intravenous Stopped 04/19/23 1020)  metoCLOPramide (REGLAN) injection 10 mg (10 mg Intravenous Given 04/19/23 0829)  iohexol (OMNIPAQUE) 300 MG/ML solution  100 mL (100 mLs Intravenous Contrast Given 04/19/23 0917)  lactated ringers bolus 1,000 mL (0 mLs Intravenous Stopped 04/19/23 1218)  promethazine (PHENERGAN) 12.5 mg in sodium chloride 0.9 % 50 mL IVPB (0 mg Intravenous Stopped 04/19/23 1151)  famotidine (PEPCID) IVPB 20 mg premix (0 mg Intravenous Stopped 04/19/23 1129)  droperidol (INAPSINE) 2.5 MG/ML injection 2.5 mg (2.5 mg Intravenous Given 04/19/23 1050)  insulin aspart (novoLOG) injection 10 Units (10 Units Subcutaneous Given 04/19/23 1343)    Mobility walks     Focused Assessments GI: generalized abdominal pain and n/v/d    R Recommendations: See Admitting Provider Note  Report given to:   Additional Notes: Pt had 1 episode of diarrhea upon arrival, but nothing since.  Pt does not have a UTI but is retaining urine.  A foley catheter was placed.

## 2023-04-19 NOTE — Plan of Care (Signed)
  Problem: Fluid Volume: Goal: Ability to maintain a balanced intake and output will improve Outcome: Progressing   Problem: Metabolic: Goal: Ability to maintain appropriate glucose levels will improve Outcome: Progressing   Problem: Nutritional: Goal: Maintenance of adequate nutrition will improve Outcome: Progressing   

## 2023-04-19 NOTE — ED Notes (Signed)
Pt reports her nausea is subsided a bit.  Sts "they normally give me phenergan."  Pt reminded we have already given her phenergan.  Also, Pt reporting her abdominal pain is 7/10.

## 2023-04-19 NOTE — ED Provider Notes (Signed)
[facility] AT [facility] Provider Note   CSN: [id number] Arrival date & time: 04/19/23  3662     History  Chief Complaint  Patient presents with   Hyperglycemia   Abdominal Pain   Dysuria    [name] [name] is a 62 y.o. female.  Patient presents to the emergency department via EMS complaining of abdominal pain and nausea and vomiting which began last night.  Patient also endorses dysuria and polyuria for the past 2 days.  Patient rates her pain 8/10 in severity. She endorses a change from humalog to novolog yesterday and states that she has taken her medication as directed. Patient also recently completed antibiotics due to a urinary tract infection. EMS administered fentanyl, Zofran, and a NS bolus during transport. They report that the patient became hypoxic after the fentanyl administration.  Her saturations dropped into the 80% range.  Patient denies chest pain, shortness of breath, fevers.  Past medical history significant for CKD stage II, type II DM, gastroparesis, paroxysmal A-fib, IBS, GERD, generalized anxiety disorder, nonischemic cardiomyopathy, chronic systolic and diastolic CHF  HPI     Home Medications Prior to Admission medications   Medication Sig Start Date End Date Taking? Authorizing Provider  aspirin EC 81 MG tablet Take 81 mg by mouth daily. Swallow whole.    [provider]  atorvastatin (LIPITOR) 80 MG tablet Take 1 tablet (80 mg total) by mouth daily. 01/12/22   Bedsole, Amy E, MD  cephALEXin (KEFLEX) 500 MG capsule Take 1 capsule (500 mg total) by mouth 4 (four) times daily. 03/29/23   Lorre Nick, MD  Continuous Blood Gluc Sensor (DEXCOM G6 SENSOR) MISC INJECT 1 SENSOR INTO SKIN EVERY 10 DAYS 07/18/22   Bedsole, Amy E, MD  Continuous Blood Gluc Transmit (DEXCOM G6 TRANSMITTER) MISC USE AS DIRECTED FOR CONTINUOUS GLUCOSE MONITORING. REUSE TRANSMITTER X 90DAYS THEN DISCARD & REPLACE 07/05/21   Bedsole, Amy E, MD   eletriptan (RELPAX) 40 MG tablet Take 1 tablet (40 mg total) by mouth as needed for migraine or headache. May repeat in 2 hours if headache persists or recurs. Patient taking differently: Take 40 mg by mouth every 2 (two) hours as needed for migraine or headache. 08/14/22   Bedsole, Amy E, MD  insulin lispro (HUMALOG KWIKPEN) 100 UNIT/ML KwikPen INJECT INTO SKIN 3 TIMES A DAY AT MEAL TIME ACCORDING TO THE FOLLOWING SLIDING SCALE: 27 UNITS MAX PER DAY DX E11.43 Patient taking differently: Inject 1-4 Units into the skin with breakfast, with lunch, and with evening meal. Per sliding scale. 02/26/22   Bedsole, Amy E, MD  Insulin Pen Needle (PEN NEEDLES) 31G X 8 MM MISC Use to inject insulin 4 times a day.  Dx: E11.10 07/21/21   Excell Seltzer, MD  Lancets (ONETOUCH ULTRASOFT) lancets Use to check blood sugar daily as needed.  Dx: E11.10 12/23/17   Excell Seltzer, MD  levothyroxine (SYNTHROID) 137 MCG tablet Take 959 mcg by mouth every Sunday. 03/19/22   [provider]  metoCLOPramide (REGLAN) 5 MG tablet Take 1 tablet (5 mg total) by mouth 3 (three) times daily before meals. 02/06/23   Albertine Grates, MD  midodrine (PROAMATINE) 5 MG tablet Take 1 tablet (5 mg total) by mouth 2 (two) times daily with a meal. Hold if your systolic blood pressure  ( top number) is > , please check your blood pressure at home once a day and bring in record for your doctor to review,  follow up with your pcp and cardiology regarding your blood pressure medication 02/06/23   Albertine Grates, MD  pantoprazole (PROTONIX) 40 MG tablet Take 1 tablet (40 mg total) by mouth 2 (two) times daily. 02/06/23 03/08/23  Albertine Grates, MD  promethazine (PHENERGAN) 25 MG tablet Take 1 tablet (25 mg total) by mouth every 8 (eight) hours as needed for nausea or vomiting. 01/05/22   Bedsole, Amy E, MD  TOUJEO SOLOSTAR 300 UNIT/ML Solostar Pen Inject 6 Units into the skin at bedtime. 03/29/22   Regalado, Belkys A, MD  traZODone (DESYREL) 50 MG tablet Take 100  mg by mouth at bedtime as needed for sleep. 02/01/23   [provider]      Allergies    Codeine    Review of Systems   Review of Systems  Physical Exam Updated Vital Signs BP (!) 170/98   Pulse (!) 110   Temp 98.5 F (36.9 C) (Oral)   Resp (!) 23   Ht 4\' 11"  (1.499 m)   Wt 52.2 kg   LMP 08/11/2012   SpO2 96%   BMI 23.23 kg/m  Physical Exam Vitals and nursing note reviewed.  Constitutional:      General: She is not in acute distress.    Appearance: She is well-developed.  HENT:     Head: Normocephalic and atraumatic.  Eyes:     General: No scleral icterus.    Conjunctiva/sclera: Conjunctivae normal.  Cardiovascular:     Rate and Rhythm: Regular rhythm. Tachycardia present.     Heart sounds: No murmur heard. Pulmonary:     Effort: Pulmonary effort is normal. No respiratory distress.     Breath sounds: Normal breath sounds.  Abdominal:     Palpations: Abdomen is soft.     Tenderness: There is generalized abdominal tenderness and tenderness in the right lower quadrant.  Musculoskeletal:        General: No swelling.     Cervical back: Neck supple.  Skin:    General: Skin is warm and dry.     Capillary Refill: Capillary refill takes less than 2 seconds.  Neurological:     Mental Status: She is alert.  Psychiatric:        Mood and Affect: Mood normal.     ED Results / Procedures / Treatments   Labs (all labs ordered are listed, but only abnormal results are displayed) Labs Reviewed  LIPASE, BLOOD - Abnormal; Notable for the following components:      Result Value   Lipase 99 (*)    All other components within normal limits  COMPREHENSIVE METABOLIC PANEL - Abnormal; Notable for the following components:   Sodium 133 (*)    Chloride 96 (*)    Glucose, Bld 457 (*)    Calcium 8.5 (*)    AST 12 (*)    All other components within normal limits  CBC - Abnormal; Notable for the following components:   WBC 13.0 (*)    RBC 5.34 (*)    Hemoglobin 15.4  (*)    Platelets 432 (*)    All other components within normal limits  URINALYSIS, ROUTINE W REFLEX MICROSCOPIC - Abnormal; Notable for the following components:   Color, Urine COLORLESS (*)    Glucose, UA >=500 (*)    Ketones, ur 5 (*)    Bacteria, UA RARE (*)    All other components within normal limits  BLOOD GAS, VENOUS - Abnormal; Notable for the following components:   pH, Ven  7.45 (*)    pCO2, Ven 40 (*)    pO2, Ven 164 (*)    Acid-Base Excess 3.5 (*)    All other components within normal limits  CBG MONITORING, ED - Abnormal; Notable for the following components:   Glucose-Capillary 461 (*)    All other components within normal limits  CBG MONITORING, ED - Abnormal; Notable for the following components:   Glucose-Capillary 379 (*)    All other components within normal limits  CBG MONITORING, ED    EKG EKG Interpretation  Date/Time:  Friday Apr 19 2023 08:05:12 EDT Ventricular Rate:  97 PR Interval:  172 QRS Duration: 90 QT Interval:  367 QTC Calculation: 467 R Axis:   90 Text Interpretation: Sinus rhythm Confirmed by Vivi Barrack [phone]) on 04/19/2023 9:49:00 AM  Radiology CT ABDOMEN PELVIS W CONTRAST  Result Date: 04/19/2023 CLINICAL DATA:  Polyuria and dysuria for 2 days, generalized abdominal pain, nausea, vomiting. EXAM: CT ABDOMEN AND PELVIS WITH CONTRAST TECHNIQUE: Multidetector CT imaging of the abdomen and pelvis was performed using the standard protocol following bolus administration of intravenous contrast. RADIATION DOSE REDUCTION: This exam was performed according to the departmental dose-optimization program which includes automated exposure control, adjustment of the mA and/or kV according to patient size and/or use of iterative reconstruction technique. CONTRAST:  OMNIPAQUE IOHEXOL 300 MG/ML  SOLN COMPARISON:  CT abdomen/pelvis most recently 03/29/2023 FINDINGS: Lower chest: The lung bases are clear. The imaged heart is unremarkable. Hepatobiliary:  The previously seen enhancing lesion in hepatic segment II is not identified on the current study. The liver is diffusely hypoattenuating suggesting fatty infiltration. There are no focal lesions on the current study. Nodular enhancement of the gallbladder fundus is unchanged since 2021 and likely reflects adenomyomatosis. The gallbladder is otherwise unremarkable. There is no biliary ductal dilatation. Pancreas: Unremarkable. Spleen: Unremarkable. Adrenals/Urinary Tract: The adrenals are unremarkable. There is mild hydroureteronephrosis due to a markedly distended urinary bladder, similar to prior studies. There are no focal parenchymal lesions. There is no stone in either kidney or along the course of either ureter. There is excretion of contrast into the collecting systems on the delayed images. Stomach/Bowel: Mild distal esophageal wall thickening is again seen which may reflect esophagitis. The gastric mucosa is hyperenhancing. A small superiorly directed outpouching arising from the third portion of the duodenum is unchanged going back to 2022 and is consistent with a small diverticulum (2-66). There is no evidence of bowel obstruction. There is no abnormal bowel wall thickening or inflammatory change. There is colonic diverticulosis without evidence of acute diverticulitis. The appendix is normal. Vascular/Lymphatic: The abdominal aorta is normal in course and caliber with scattered calcified plaque. The major branch vessels are patent. The main portal and splenic veins are patent. There is no abdominal or pelvic lymphadenopathy. Reproductive: The uterus is surgically absent. There is no adnexal mass. Other: There is no ascites or free air. A small fat containing umbilical hernia is unchanged. Musculoskeletal: There is no acute osseous abnormality or suspicious osseous lesion. IMPRESSION: 1. Markedly distended urinary bladder with mild bilateral hydroureteronephrosis, similar to prior studies. Correlate for  retention and consider decompression. 2. Hyperenhancing gastric mucosa could reflect gastritis. Mild distal esophageal wall thickening is similar to prior studies and may reflect gastritis. 3. Hepatic steatosis. The previously seen hyperenhancing lesion in the liver is not seen on the current study. 4. Diverticulosis. Electronically Signed   By: Lesia Hausen M.D.   On: 04/19/2023 10:03    Procedures Procedures  Medications Ordered in ED Medications  lactated ringers bolus 1,000 mL (0 mLs Intravenous Stopped 04/19/23 1020)  metoCLOPramide (REGLAN) injection 10 mg (10 mg Intravenous Given 04/19/23 0829)  iohexol (OMNIPAQUE) 300 MG/ML solution 100 mL (100 mLs Intravenous Contrast Given 04/19/23 0917)  lactated ringers bolus 1,000 mL (0 mLs Intravenous Stopped 04/19/23 1218)  promethazine (PHENERGAN) 12.5 mg in sodium chloride 0.9 % 50 mL IVPB (0 mg Intravenous Stopped 04/19/23 1151)  famotidine (PEPCID) IVPB 20 mg premix (0 mg Intravenous Stopped 04/19/23 1129)  droperidol (INAPSINE) 2.5 MG/ML injection 2.5 mg (2.5 mg Intravenous Given 04/19/23 1050)  insulin aspart (novoLOG) injection 10 Units (10 Units Subcutaneous Given 04/19/23 1343)    ED Course/ Medical Decision Making/ A&P                             Medical Decision Making Amount and/or Complexity of Data Reviewed Labs: ordered. Radiology: ordered.  Risk Prescription drug management.   This patient presents to the ED for concern of abdominal pain, nausea, vomiting, this involves an extensive number of treatment options, and is a complaint that carries with it a high risk of complications and morbidity.  The differential diagnosis includes gastroparesis, appendicitis, cholecystitis, others   Co morbidities that complicate the patient evaluation  History gastroparesis   Additional history obtained:  Additional history obtained from EMS External records from outside source obtained and reviewed including notes from 5/2 from  [facility]   Lab Tests:  I Ordered, and personally interpreted labs.  The pertinent results include: Initial glucose 457, UA with rare bacteria, negative nitrite, negative leukocytes; lipase 99; WBC 13.0 (appears to be close to baseline for patient)   Imaging Studies ordered:  I ordered imaging studies including CT abdomen pelvis with contrast I independently visualized and interpreted imaging which showed  1. Markedly distended urinary bladder with mild bilateral  hydroureteronephrosis, similar to prior studies. Correlate for  retention and consider decompression.  2. Hyperenhancing gastric mucosa could reflect gastritis. Mild  distal esophageal wall thickening is similar to prior studies and  may reflect gastritis.  3. Hepatic steatosis. The previously seen hyperenhancing lesion in  the liver is not seen on the current study.  4. Diverticulosis.   I agree with the radiologist interpretation   Cardiac Monitoring: / EKG:  The patient was maintained on a cardiac monitor.  I personally viewed and interpreted the cardiac monitored which showed an underlying rhythm of: Sinus rhythm   Consultations Obtained:  I requested consultation with the hospitalist, Dr.Ikram  and discussed lab and imaging findings as well as pertinent plan - they recommend: admission   Problem List / ED Course / Critical interventions / Medication management   I ordered medication including Reglan, Phenergan, Pepcid, droperidol, LR bolus. Novolog for hyperglycemia Reevaluation of the patient after these medicines showed that the patient stayed the same I have reviewed the patients home medicines and have made adjustments as needed   Test / Admission - Considered:  Patient continues to complain of nausea with episodes of dry heaving.  Patient has been unable to drink water due to nausea.  She has remained mildly tachycardic with a rate between 100 and 115 after 2 L of lactated Ringer's.   She complains of milder pain at this time but still complains of some generalized abdominal pain.  Patient also hyperglycemic but does not appear to be in DKA.  Possible gastritis on CT abdomen pelvis.  Feel that patient would benefit from admission for further IV antiemetics and fluids, insulin.         Final Clinical Impression(s) / ED Diagnoses Final diagnoses:  Intractable nausea  Generalized abdominal pain  Hyperglycemia    Rx / DC Orders ED Discharge Orders     None         Pamala Duffel 04/19/23 1403    Loetta Rough, MD 04/22/23 1104

## 2023-04-19 NOTE — ED Notes (Signed)
Pt requesting additional nausea medication.  EDP made aware.

## 2023-04-20 DIAGNOSIS — R112 Nausea with vomiting, unspecified: Secondary | ICD-10-CM | POA: Diagnosis not present

## 2023-04-20 LAB — CBC
HCT: 41.3 % (ref 36.0–46.0)
Hemoglobin: 13.3 g/dL (ref 12.0–15.0)
MCH: 28.5 pg (ref 26.0–34.0)
MCHC: 32.2 g/dL (ref 30.0–36.0)
MCV: 88.4 fL (ref 80.0–100.0)
Platelets: 324 10*3/uL (ref 150–400)
RBC: 4.67 MIL/uL (ref 3.87–5.11)
RDW: 15.6 % — ABNORMAL HIGH (ref 11.5–15.5)
WBC: 10.1 10*3/uL (ref 4.0–10.5)
nRBC: 0 % (ref 0.0–0.2)

## 2023-04-20 LAB — BASIC METABOLIC PANEL
Anion gap: 8 (ref 5–15)
BUN: 13 mg/dL (ref 6–20)
CO2: 27 mmol/L (ref 22–32)
Calcium: 8.5 mg/dL — ABNORMAL LOW (ref 8.9–10.3)
Chloride: 104 mmol/L (ref 98–111)
Creatinine, Ser: 0.6 mg/dL (ref 0.44–1.00)
GFR, Estimated: >60 mL/min (ref >60)
Glucose, Bld: 83 mg/dL (ref 70–99)
Potassium: 3.3 mmol/L — ABNORMAL LOW (ref 3.5–5.1)
Sodium: 139 mmol/L (ref 135–145)

## 2023-04-20 LAB — GLUCOSE, CAPILLARY
Glucose-Capillary: 179 mg/dL — ABNORMAL HIGH (ref 70–99)
Glucose-Capillary: 220 mg/dL — ABNORMAL HIGH (ref 70–99)
Glucose-Capillary: 231 mg/dL — ABNORMAL HIGH (ref 70–99)
Glucose-Capillary: 243 mg/dL — ABNORMAL HIGH (ref 70–99)
Glucose-Capillary: 250 mg/dL — ABNORMAL HIGH (ref 70–99)
Glucose-Capillary: 281 mg/dL — ABNORMAL HIGH (ref 70–99)
Glucose-Capillary: 80 mg/dL (ref 70–99)

## 2023-04-20 MED ORDER — METOCLOPRAMIDE HCL 10 MG PO TABS
5.0000 mg | ORAL_TABLET | Freq: Three times a day (TID) | ORAL | Status: DC
  Administered 2023-04-20 – 2023-04-21 (×4): 5 mg via ORAL
  Filled 2023-04-20 (×4): qty 1

## 2023-04-20 MED ORDER — POTASSIUM CHLORIDE CRYS ER 20 MEQ PO TBCR
40.0000 meq | EXTENDED_RELEASE_TABLET | ORAL | Status: AC
  Administered 2023-04-20 (×2): 40 meq via ORAL
  Filled 2023-04-20 (×2): qty 2

## 2023-04-20 MED ORDER — MIDODRINE HCL 5 MG PO TABS
5.0000 mg | ORAL_TABLET | Freq: Two times a day (BID) | ORAL | Status: DC
  Administered 2023-04-20 – 2023-04-21 (×3): 5 mg via ORAL
  Filled 2023-04-20 (×3): qty 1

## 2023-04-20 MED ORDER — ACETAMINOPHEN 500 MG PO TABS: 500.0000 mg | ORAL_TABLET | Freq: Four times a day (QID) | ORAL | Status: DC | PRN

## 2023-04-20 MED ORDER — LEVOTHYROXINE SODIUM 25 MCG PO TABS
137.0000 ug | ORAL_TABLET | Freq: Every day | ORAL | Status: DC
  Administered 2023-04-20 – 2023-04-21 (×2): 137 ug via ORAL
  Filled 2023-04-20 (×2): qty 1

## 2023-04-20 MED ORDER — PROMETHAZINE HCL 25 MG PO TABS: 25.0000 mg | ORAL_TABLET | Freq: Three times a day (TID) | ORAL | Status: DC | PRN

## 2023-04-20 MED ORDER — INSULIN ASPART 100 UNIT/ML IJ SOLN
0.0000 [IU] | Freq: Three times a day (TID) | INTRAMUSCULAR | Status: DC
  Administered 2023-04-20 (×2): 5 [IU] via SUBCUTANEOUS
  Administered 2023-04-21: 3 [IU] via SUBCUTANEOUS
  Administered 2023-04-21: 5 [IU] via SUBCUTANEOUS

## 2023-04-20 MED ORDER — ZOLPIDEM TARTRATE 5 MG PO TABS
5.0000 mg | ORAL_TABLET | Freq: Every day | ORAL | Status: DC
  Administered 2023-04-20: 5 mg via ORAL
  Filled 2023-04-20: qty 1

## 2023-04-20 NOTE — Progress Notes (Signed)
  Transition of Care (TOC) Screening Note   Patient Details  Name: [name] [name] Date of Birth: [birth year]   Transition of Care [facility]) CM/SW Contact:    Adrian Prows, RN Phone Number: 04/20/2023, 4:54 PM    Transition of Care Department [facility]) has reviewed patient and no TOC needs have been identified at this time. We will continue to monitor patient advancement through interdisciplinary progression rounds. If new patient transition needs arise, please place a TOC consult.

## 2023-04-20 NOTE — Progress Notes (Signed)
[facility] Progress Note  Patient: [name] [name]     AVW:[id number]  DOA: 04/19/2023   PCP: Everardo All, NP       Brief hospital course: This is a 62 year old female with uncontrolled diabetes mellitus with gastroparesis, history of urinary retention in the past and hypothyroidism who presented to the hospital for nausea and vomiting and was found to have urinary retention. She was admitted from 2/24 through 2/28 for DKA associated with nausea and vomiting.  EGD revealed reflux esophagitis at the time.  Subjective:  Nausea and vomiting have resolved.  No abdominal pain and no diarrhea.  Agreeable to have Foley removed. Assessment and Plan: Principal Problem:   Intractable vomiting with nausea -Has a history of gastroparesis-have resumed her Reglan - She appears to be tolerating her diet today -Continue IV fluids at 75 cc an hour for today  Active Problems:   Acute urinary retention -Remove Foley today and follow over next 24 hours  Hypokalemia - Replace and recheck tomorrow    Paroxysmal A-fib ([name]) -It appears that she is only on aspirin at home - No rate controlling agents    Type 2 diabetes mellitus, severely uncontrolled with hyperglycemia Hemoglobin A1C    Component Value Date/Time   HGBA1C 13.3 (H) 02/04/2023 0346  -Has been taking 8 units of Semglee at home and Humalog - She states that when her Humalog was changed to NovoLog, she feels that her sugars became uncontrolled although the dosages were not changed - Continue Semglee and NovoLog  History of chronic hypotension - Resume midodrine today  Reflux esophagitis - She is on a PPI daily  Insomnia - Replace Lunesta with Ambien which is what we have available in the hospital  Hypothyroidism - Resume Synthroid     Code Status: Full Code Consultants: None Level of Care: Level of care: Progressive Total time on patient care: 35 minutes DVT prophylaxis:  enoxaparin (LOVENOX) injection 40 mg  Start: 04/19/23 2200 SCDs Start: 04/19/23 1441     Objective:   Vitals:   04/19/23 2131 04/19/23 2239 04/20/23 0021 04/20/23 0516  BP: (!) 81/47 (!) 90/52 95/68 116/80  Pulse: 80 77 90 85  Resp: 13 15 14 14   Temp: 98.5 F (36.9 C) 97.9 F (36.6 C)  (!) 97.4 F (36.3 C)  TempSrc: Oral Oral  Oral  SpO2: 98% 100% 97% 100%  Weight:      Height:       Filed Weights   04/19/23 0800  Weight: 52.2 kg   Exam: General exam: Appears comfortable  HEENT: oral mucosa moist Respiratory system: Clear to auscultation.  Cardiovascular system: S1 & S2 heard  Gastrointestinal system: Abdomen soft, non-tender, nondistended. Normal bowel sounds   Extremities: No cyanosis, clubbing or edema Psychiatry:  Mood & affect appropriate.      CBC: Recent Labs  Lab 04/19/23 0805 04/20/23 0503  WBC 13.0* 10.1  HGB 15.4* 13.3  HCT 45.9 41.3  MCV 86.0 88.4  PLT 432* 324   Basic Metabolic Panel: Recent Labs  Lab 04/19/23 0805 04/19/23 1819 04/20/23 0503  NA 133* 137 139  K 3.5 3.7 3.3*  CL 96* 104 104  CO2 23 22 27   GLUCOSE 457* 173* 83  BUN 14 11 13   CREATININE 0.62 0.49 0.60  CALCIUM 8.5* 8.6* 8.5*   GFR: Estimated Creatinine Clearance: 55.9 mL/min (by C-G formula based on SCr of 0.6 mg/dL).  Scheduled Meds:  aspirin EC  81 mg Oral Daily   atorvastatin  80 mg Oral Daily   enoxaparin (LOVENOX) injection  40 mg Subcutaneous Q24H   insulin aspart  0-15 Units Subcutaneous TID WC   insulin glargine-yfgn  6 Units Subcutaneous QHS   levothyroxine  137 mcg Oral Q0600   metoCLOPramide  5 mg Oral TID AC   midodrine  5 mg Oral BID WC   pantoprazole (PROTONIX) IV  40 mg Intravenous Q24H   potassium chloride  40 mEq Oral Q4H   zolpidem  5 mg Oral QHS   Continuous Infusions:  sodium chloride 75 mL/hr at 04/20/23 0539   Imaging and lab data was personally reviewed CT ABDOMEN PELVIS W CONTRAST  Result Date: 04/19/2023 CLINICAL DATA:  Polyuria and dysuria for 2 days, generalized  abdominal pain, nausea, vomiting. EXAM: CT ABDOMEN AND PELVIS WITH CONTRAST TECHNIQUE: Multidetector CT imaging of the abdomen and pelvis was performed using the standard protocol following bolus administration of intravenous contrast. RADIATION DOSE REDUCTION: This exam was performed according to the departmental dose-optimization program which includes automated exposure control, adjustment of the mA and/or kV according to patient size and/or use of iterative reconstruction technique. CONTRAST:  OMNIPAQUE IOHEXOL 300 MG/ML  SOLN COMPARISON:  CT abdomen/pelvis most recently 03/29/2023 FINDINGS: Lower chest: The lung bases are clear. The imaged heart is unremarkable. Hepatobiliary: The previously seen enhancing lesion in hepatic segment II is not identified on the current study. The liver is diffusely hypoattenuating suggesting fatty infiltration. There are no focal lesions on the current study. Nodular enhancement of the gallbladder fundus is unchanged since 2021 and likely reflects adenomyomatosis. The gallbladder is otherwise unremarkable. There is no biliary ductal dilatation. Pancreas: Unremarkable. Spleen: Unremarkable. Adrenals/Urinary Tract: The adrenals are unremarkable. There is mild hydroureteronephrosis due to a markedly distended urinary bladder, similar to prior studies. There are no focal parenchymal lesions. There is no stone in either kidney or along the course of either ureter. There is excretion of contrast into the collecting systems on the delayed images. Stomach/Bowel: Mild distal esophageal wall thickening is again seen which may reflect esophagitis. The gastric mucosa is hyperenhancing. A small superiorly directed outpouching arising from the third portion of the duodenum is unchanged going back to 2022 and is consistent with a small diverticulum (2-66). There is no evidence of bowel obstruction. There is no abnormal bowel wall thickening or inflammatory change. There is colonic  diverticulosis without evidence of acute diverticulitis. The appendix is normal. Vascular/Lymphatic: The abdominal aorta is normal in course and caliber with scattered calcified plaque. The major branch vessels are patent. The main portal and splenic veins are patent. There is no abdominal or pelvic lymphadenopathy. Reproductive: The uterus is surgically absent. There is no adnexal mass. Other: There is no ascites or free air. A small fat containing umbilical hernia is unchanged. Musculoskeletal: There is no acute osseous abnormality or suspicious osseous lesion. IMPRESSION: 1. Markedly distended urinary bladder with mild bilateral hydroureteronephrosis, similar to prior studies. Correlate for retention and consider decompression. 2. Hyperenhancing gastric mucosa could reflect gastritis. Mild distal esophageal wall thickening is similar to prior studies and may reflect gastritis. 3. Hepatic steatosis. The previously seen hyperenhancing lesion in the liver is not seen on the current study. 4. Diverticulosis. Electronically Signed   By: Lesia Hausen M.D.   On: 04/19/2023 10:03    LOS: 0 days   Author: Calvert Cantor  04/20/2023 11:23 AM  To contact [facility]>   Check the care team in [facility] and look for the attending/consulting [facility] provider listed  Log into [email or web address] and use [facility]'s universal password   Go to> "[facility]"  and find provider  If you still have difficulty reaching the provider, please page the [facility] (Director on Call) for the Hospitalists listed on amion

## 2023-04-21 DIAGNOSIS — R338 Other retention of urine: Secondary | ICD-10-CM | POA: Diagnosis not present

## 2023-04-21 DIAGNOSIS — N319 Neuromuscular dysfunction of bladder, unspecified: Secondary | ICD-10-CM | POA: Diagnosis not present

## 2023-04-21 DIAGNOSIS — R112 Nausea with vomiting, unspecified: Secondary | ICD-10-CM | POA: Diagnosis not present

## 2023-04-21 LAB — BASIC METABOLIC PANEL
Anion gap: 11 (ref 5–15)
BUN: 13 mg/dL (ref 6–20)
CO2: 22 mmol/L (ref 22–32)
Calcium: 8.7 mg/dL — ABNORMAL LOW (ref 8.9–10.3)
Chloride: 101 mmol/L (ref 98–111)
Creatinine, Ser: 0.71 mg/dL (ref 0.44–1.00)
GFR, Estimated: >60 mL/min (ref >60)
Glucose, Bld: 234 mg/dL — ABNORMAL HIGH (ref 70–99)
Potassium: 4.1 mmol/L (ref 3.5–5.1)
Sodium: 134 mmol/L — ABNORMAL LOW (ref 135–145)

## 2023-04-21 LAB — GLUCOSE, CAPILLARY
Glucose-Capillary: 225 mg/dL — ABNORMAL HIGH (ref 70–99)
Glucose-Capillary: 177 mg/dL — ABNORMAL HIGH (ref 70–99)
Glucose-Capillary: 179 mg/dL — ABNORMAL HIGH (ref 70–99)

## 2023-04-21 LAB — MAGNESIUM: Magnesium: 1.8 mg/dL (ref 1.7–2.4)

## 2023-04-21 MED ORDER — PANTOPRAZOLE SODIUM 40 MG PO TBEC: 40.0000 mg | DELAYED_RELEASE_TABLET | Freq: Every day | ORAL | Status: DC

## 2023-04-21 NOTE — Plan of Care (Signed)
I have messaged urology about requesting a consult. She is retaining about 4-500 cc in her bladder after voiding twice back to back.   Calvert Cantor, MD

## 2023-04-21 NOTE — Consult Note (Signed)
Urology Consult   Physician requesting consult: Calvert Cantor, MD  Reason for consult: urinary retention  History of Present Illness: [name] [name] is a 62 y.o. female with a PMH of CHF, uncontrolled T1DM c/b peripheral neuropathy, HLD, HTN, IBS, CKD, and ?NGB with history of urinary retention who presented with n/v and acute urinary retention. She was admitted to Hospitalist team for management. Urology was consulted for recommendations regarding urinary retention.  Patient reportedly had a foley catheter placed on admission 04/19/23. She underwent TOV on 04/20/23 and failed with bladder scans in the 400-500cc range. Patient reports she does not feel a sensation of a full bladder. She has attempted double-voiding with little success and minimal improvement in PVR. She notes she has issues with voiding at baseline, primarily difficulty emptying and occasional leakage. She was previously seen by [facility] extender in February 2024 where she was noted to have likely NGB 2/2 poorly controlled DM. She was scheduled for follow-up with Dr. Marlou Porch but missed 2 subsequent appointments because her husband was in the hospital.   On our evaluation, patient is refusing foley catheter despite elevated PVRs. She notes catheters have been immensely uncomfortable in the past.   Past Medical History:  Diagnosis Date   Allergy    seasonal   Anxiety    B12 deficiency    Chest pain 04/05/2017   CHF (congestive heart failure) ([name])    Diabetes mellitus type 1, uncontrolled    "dr. 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) 10/29/2013   Leg cramping    "@ night" (09/18/2013)   Migraine    "once q couple months" (11/26/2018)   Osteoporosis     Peripheral neuropathy    Umbilical hernia    unrepaired (09/18/2013)    Past Surgical History:  Procedure Laterality Date   BIOPSY  06/03/2019   Procedure: BIOPSY;  Surgeon: Lynann Bologna, MD;  Location: [facility];  Service: Endoscopy;;   BIOPSY  08/17/2020   Procedure: BIOPSY;  Surgeon: Tressia Danas, MD;  Location: [facility];  Service: Gastroenterology;;   BIOPSY  02/05/2023   Procedure: BIOPSY;  Surgeon: Meryl Dare, MD;  Location: [facility];  Service: Gastroenterology;;   CESAREAN SECTION  1989   ENTEROSCOPY N/A 06/03/2019   Procedure: ENTEROSCOPY;  Surgeon: Lynann Bologna, MD;  Location: [facility];  Service: Endoscopy;  Laterality: N/A;   ESOPHAGOGASTRODUODENOSCOPY (EGD) WITH PROPOFOL N/A 08/17/2020   Procedure: ESOPHAGOGASTRODUODENOSCOPY (EGD) WITH PROPOFOL;  Surgeon: Tressia Danas, MD;  Location: [facility];  Service: Gastroenterology;  Laterality: N/A;   ESOPHAGOGASTRODUODENOSCOPY (EGD) WITH PROPOFOL N/A 02/05/2023   Procedure: ESOPHAGOGASTRODUODENOSCOPY (EGD) WITH PROPOFOL;  Surgeon: Meryl Dare, MD;  Location: [facility];  Service: Gastroenterology;  Laterality: N/A;   LAPAROSCOPIC ASSISTED VAGINAL HYSTERECTOMY  09/23/2012   Procedure: LAPAROSCOPIC ASSISTED VAGINAL HYSTERECTOMY;  Surgeon: Mitchel Honour, DO;  Location: [facility];  Service: Gynecology;  Laterality: N/A;  pull Dr Vance Gather instrument   LEFT HEART CATH AND CORONARY ANGIOGRAPHY N/A 02/11/2017   Procedure: Left Heart Cath and Coronary Angiography;  Surgeon: Runell Gess, MD;  Location: [facility] CV [facility];  Service: Cardiovascular;  Laterality: N/A;   TUBAL LIGATION Bilateral 1992     Current Hospital Medications:  Home  meds:  No current facility-administered medications on file prior to encounter.   Current Outpatient Medications on File Prior to Encounter  Medication Sig Dispense Refill   aspirin EC 81 MG tablet Take 81 mg by mouth daily. Swallow whole.     Continuous Blood Gluc Sensor  (DEXCOM G6 SENSOR) MISC INJECT 1 SENSOR INTO SKIN EVERY 10 DAYS (Patient taking differently: Inject 1 Device into the skin See admin instructions. Place 1 new device into the skin every 10 days) 3 each 11   eszopiclone (LUNESTA) 1 MG TABS tablet Take 1 mg by mouth at bedtime. Take immediately before bedtime     insulin lispro (HUMALOG KWIKPEN) 100 UNIT/ML KwikPen INJECT INTO SKIN 3 TIMES A DAY AT MEAL TIME ACCORDING TO THE FOLLOWING SLIDING SCALE: 27 UNITS MAX PER DAY DX E11.43 (Patient taking differently: Inject 4 Units into the skin See admin instructions. Inject 4 units into the skin three times a day before meals, per sliding scale) 15 mL 5   levothyroxine (SYNTHROID) 137 MCG tablet Take 137 mcg by mouth daily before breakfast.     metoCLOPramide (REGLAN) 5 MG tablet Take 1 tablet (5 mg total) by mouth 3 (three) times daily before meals. (Patient taking differently: Take 5 mg by mouth See admin instructions. Take 5 mg by mouth three times a day before meals as needed for heartburn/G.I. symptoms) 90 tablet 0   midodrine (PROAMATINE) 5 MG tablet Take 1 tablet (5 mg total) by mouth 2 (two) times daily with a meal. Hold if your systolic blood pressure  ( top number) is > , please check your blood pressure at home once a day and bring in record for your doctor to review, follow up with your pcp and cardiology regarding your blood pressure medication (Patient taking differently: Take 5 mg by mouth See admin instructions. Take 5 mg by mouth two times a day with meals and hold if the systolic blood pressure (top number) is > 140 mmhg. Additionally, "please check your blood pressure at home once a day and bring in record for your doctor to review. Follow up with your pcp and cardiology regarding your blood pressure medication.") 180 tablet 3   pantoprazole (PROTONIX) 40 MG tablet Take 1 tablet (40 mg total) by mouth 2 (two) times daily. 60 tablet 0   promethazine (PHENERGAN) 25 MG tablet Take 1 tablet (25  mg total) by mouth every 8 (eight) hours as needed for nausea or vomiting. 30 tablet 3   TOUJEO SOLOSTAR 300 UNIT/ML Solostar Pen Inject 6 Units into the skin at bedtime. (Patient taking differently: Inject 8 Units into the skin at bedtime.) 100 mL 0   TYLENOL 500 MG tablet Take 500-1,000 mg by mouth every 6 (six) hours as needed for mild pain or headache.     atorvastatin (LIPITOR) 80 MG tablet Take 1 tablet (80 mg total) by mouth daily. (Patient not taking: Reported on 04/19/2023) 90 tablet 3   Continuous Blood Gluc Transmit (DEXCOM G6 TRANSMITTER) MISC USE AS DIRECTED FOR CONTINUOUS GLUCOSE MONITORING. REUSE TRANSMITTER X 90DAYS THEN DISCARD & REPLACE 1 each 3   eletriptan (RELPAX) 40 MG tablet Take 1 tablet (40 mg total) by mouth as needed for migraine or headache. May repeat in 2 hours if headache persists or recurs. (Patient not taking: Reported on 04/19/2023) 10 tablet 0   Insulin Pen Needle (PEN NEEDLES) 31G X 8 MM MISC Use to inject insulin 4 times a day.  Dx: E11.10 130 each 11  Lancets (ONETOUCH ULTRASOFT) lancets Use to check blood sugar daily as needed.  Dx: E11.10 100 each 3     Scheduled Meds:  aspirin EC  81 mg Oral Daily   atorvastatin  80 mg Oral Daily   enoxaparin (LOVENOX) injection  40 mg Subcutaneous Q24H   insulin aspart  0-15 Units Subcutaneous TID WC   insulin glargine-yfgn  6 Units Subcutaneous QHS   levothyroxine  137 mcg Oral Q0600   metoCLOPramide  5 mg Oral TID AC   midodrine  5 mg Oral BID WC   [START ON 04/22/2023] pantoprazole  40 mg Oral Daily   zolpidem  5 mg Oral QHS   Continuous Infusions:  sodium chloride 75 mL/hr at 04/21/23 0725   PRN Meds:.acetaminophen **OR** acetaminophen, albuterol, metoprolol tartrate, ondansetron **OR** ondansetron (ZOFRAN) IV, polyethylene glycol, promethazine, traZODone  Allergies:  Allergies  Allergen Reactions   Codeine Hives, Anxiety and Other (See Comments)    Family History  Problem Relation Age of Onset   Diabetes  Other    Heart disease Other    Heart failure Mother 75       Her heart skipped a beat and stopped - per pt   Diabetes Maternal Grandmother    Alzheimer's disease Maternal Grandmother    Coronary artery disease Maternal Grandmother    Heart attack Maternal Grandfather    Heart failure Brother    Diabetes Brother    Colon polyps Neg Hx    Esophageal cancer Neg Hx    Liver cancer Neg Hx    Pancreatic cancer Neg Hx    Stomach cancer Neg Hx    Crohn's disease Neg Hx    Rectal cancer Neg Hx     Social History:  reports that she quit smoking about 32 years ago. Her smoking use included cigarettes. She has a 1.20 pack-year smoking history. She has never used smokeless tobacco. She reports that she does not currently use drugs after having used the following drugs: Marijuana. She reports that she does not drink alcohol.  ROS: A complete review of systems was performed.  All systems are negative except for pertinent findings as noted.  Physical Exam:  Vital signs in last 24 hours: Temp:  [99 F (37.2 C)-99.1 F (37.3 C)] 99 F (37.2 C) (05/12 0436) Pulse Rate:  [88-96] 91 (05/12 0436) Resp:  [18] 18 (05/12 0436) BP: (97-157)/(78-99) 149/99 (05/12 0436) SpO2:  [99 %-100 %] 100 % (05/12 0436) Constitutional:  Alert and oriented, No acute distress Cardiovascular: Regular rate and rhythm, No JVD Respiratory: Normal respiratory effort, Lungs clear bilaterally GI: Abdomen is soft, nontender, nondistended, no abdominal masses GU: No CVA tenderness Lymphatic: No lymphadenopathy Neurologic: Grossly intact, no focal deficits Psychiatric: Normal mood and affect  Laboratory Data:  Recent Labs    04/19/23 0805 04/20/23 0503  WBC 13.0* 10.1  HGB 15.4* 13.3  HCT 45.9 41.3  PLT 432* 324    Recent Labs    04/19/23 0805 04/19/23 1819 04/20/23 0503 04/21/23 0504  NA 133* 137 139 134*  K 3.5 3.7 3.3* 4.1  CL 96* 104 104 101  GLUCOSE 457* 173* 83 234*  BUN 14 11 13 13   CALCIUM 8.5*  8.6* 8.5* 8.7*  CREATININE 0.62 0.49 0.60 0.71     Results for orders placed or performed during the hospital encounter of 04/19/23 (from the past 24 hour(s))  Glucose, capillary     Status: Abnormal   Collection Time: 04/20/23  4:04 PM  Result Value  Ref Range   Glucose-Capillary 250 (H) 70 - 99 mg/dL  Glucose, capillary     Status: Abnormal   Collection Time: 04/20/23  7:47 PM  Result Value Ref Range   Glucose-Capillary 231 (H) 70 - 99 mg/dL  Glucose, capillary     Status: Abnormal   Collection Time: 04/20/23 11:50 PM  Result Value Ref Range   Glucose-Capillary 281 (H) 70 - 99 mg/dL  Basic metabolic panel     Status: Abnormal   Collection Time: 04/21/23  5:04 AM  Result Value Ref Range   Sodium 134 (L) 135 - 145 mmol/L   Potassium 4.1 3.5 - 5.1 mmol/L   Chloride 101 98 - 111 mmol/L   CO2 22 22 - 32 mmol/L   Glucose, Bld 234 (H) 70 - 99 mg/dL   BUN 13 6 - 20 mg/dL   Creatinine, Ser 1.61 0.44 - 1.00 mg/dL   Calcium 8.7 (L) 8.9 - 10.3 mg/dL   GFR, Estimated >09 >60 mL/min   Anion gap 11 5 - 15  Magnesium     Status: None   Collection Time: 04/21/23  5:04 AM  Result Value Ref Range   Magnesium 1.8 1.7 - 2.4 mg/dL  Glucose, capillary     Status: Abnormal   Collection Time: 04/21/23  7:37 AM  Result Value Ref Range   Glucose-Capillary 225 (H) 70 - 99 mg/dL  Glucose, capillary     Status: Abnormal   Collection Time: 04/21/23 11:38 AM  Result Value Ref Range   Glucose-Capillary 177 (H) 70 - 99 mg/dL   No results found for this or any previous visit (from the past 240 hour(s)).  Renal Function: Recent Labs    04/19/23 0805 04/19/23 1819 04/20/23 0503 04/21/23 0504  CREATININE 0.62 0.49 0.60 0.71   Estimated Creatinine Clearance: 55.9 mL/min (by C-G formula based on SCr of 0.71 mg/dL).  Radiologic Imaging: No results found.  I independently reviewed the above imaging studies.  Impression/Recommendation: #Neurogenic bladder c/b chronic urinary retention - Likely  secondary to poorly controlled T1DM - Patient initially refused catheter despite elevated PVRs (400-500cc). She was counseled extensively on the risks including renal failure, need for dialysis, UTIs, and possibly even death. She refused several times but ultimately elected for catheter placement. - Continue foley catheter to drainage until follow-up with [facility]; we will request appointment - At this appointment, we will discuss long-term management strategies for NGB including CIC, SPT, or chronic indwelling foley. She may require UDS, however this would not likely change our management strategies much  Carlus Pavlov 04/21/2023, 12:08 PM

## 2023-04-21 NOTE — Progress Notes (Signed)
[facility] Progress Note  Patient: [name] [name]     ZOX:[id number]  DOA: 04/19/2023   PCP: Everardo All, NP       Brief hospital course: This is a 62 year old female with uncontrolled diabetes mellitus with gastroparesis, history of urinary retention in the past and hypothyroidism who presented to the hospital for nausea and vomiting and was found to have urinary retention. This is her 3rd admission these complaints.   She was admitted from 2/24 through 2/28 for DKA associated with nausea and vomiting.  EGD revealed reflux esophagitis at the time.  Subjective:  No complaints today. Eating well. No urinary issues.   Assessment and Plan: Principal Problem:   Intractable vomiting with nausea -Has a history of gastroparesis-have resumed her Reglan - She appears to be tolerating her diet today - dc IVF  Active Problems:   Acute urinary retention - noted on 2 prior admissions recently and may be causing above symptoms -voiding but has 4-500 in bladder after adequately voiding - Urology has recommended a foley cath and she is currently declining  Hypokalemia - Replaced    Paroxysmal A-fib ([name]) -It appears that she is only on aspirin at home - No rate controlling agents    Type 2 diabetes mellitus, severely uncontrolled with hyperglycemia Hemoglobin A1C    Component Value Date/Time   HGBA1C 13.3 (H) 02/04/2023 0346  -Has been taking 8 units of Semglee at home and Humalog - She states that when her Humalog was changed to NovoLog, she feels that her sugars became uncontrolled although the dosages were not changed - Continue Semglee and NovoLog  History of chronic hypotension - Resume midodrine today  Reflux esophagitis - She is on a PPI daily  Insomnia - Replace Lunesta with Ambien which is what we have available in the hospital  Hypothyroidism - Resume Synthroid     Code Status: Full Code Consultants: None Level of Care: Level of care:  Progressive Total time on patient care: 35 minutes DVT prophylaxis:  enoxaparin (LOVENOX) injection 40 mg Start: 04/19/23 2200 SCDs Start: 04/19/23 1441     Objective:   Vitals:   04/20/23 0516 04/20/23 1318 04/20/23 1948 04/21/23 0436  BP: 116/80 97/78 (!) 157/94 (!) 149/99  Pulse: 85 96 88 91  Resp: 14 18 18 18   Temp: (!) 97.4 F (36.3 C) 99.1 F (37.3 C) 99 F (37.2 C) 99 F (37.2 C)  TempSrc: Oral Oral Oral Oral  SpO2: 100% 100% 99% 100%  Weight:      Height:       Filed Weights   04/19/23 0800  Weight: 52.2 kg   Exam: General exam: Appears comfortable  HEENT: oral mucosa moist Respiratory system: Clear to auscultation.  Cardiovascular system: S1 & S2 heard  Gastrointestinal system: Abdomen soft, non-tender, nondistended. Normal bowel sounds   Extremities: No cyanosis, clubbing or edema Psychiatry:  Mood & affect appropriate.      CBC: Recent Labs  Lab 04/19/23 0805 04/20/23 0503  WBC 13.0* 10.1  HGB 15.4* 13.3  HCT 45.9 41.3  MCV 86.0 88.4  PLT 432* 324    Basic Metabolic Panel: Recent Labs  Lab 04/19/23 0805 04/19/23 1819 04/20/23 0503 04/21/23 0504  NA 133* 137 139 134*  K 3.5 3.7 3.3* 4.1  CL 96* 104 104 101  CO2 23 22 27 22   GLUCOSE 457* 173* 83 234*  BUN 14 11 13 13   CREATININE 0.62 0.49 0.60 0.71  CALCIUM 8.5* 8.6* 8.5* 8.7*  MG  --   --   --  1.8    GFR: Estimated Creatinine Clearance: 55.9 mL/min (by C-G formula based on SCr of 0.71 mg/dL).  Scheduled Meds:  aspirin EC  81 mg Oral Daily   atorvastatin  80 mg Oral Daily   enoxaparin (LOVENOX) injection  40 mg Subcutaneous Q24H   insulin aspart  0-15 Units Subcutaneous TID WC   insulin glargine-yfgn  6 Units Subcutaneous QHS   levothyroxine  137 mcg Oral Q0600   metoCLOPramide  5 mg Oral TID AC   midodrine  5 mg Oral BID WC   [START ON 04/22/2023] pantoprazole  40 mg Oral Daily   zolpidem  5 mg Oral QHS   Continuous Infusions:  sodium chloride 75 mL/hr at 04/21/23 0725    Imaging and lab data was personally reviewed No results found.  LOS: 0 days   Author: Calvert Cantor  04/21/2023 12:25 PM  To contact [facility]>   Check the care team in [facility] and look for the attending/consulting [facility] provider listed  Log into [email or web address] and use [place]'s universal password   Go to> "[facility]"  and find provider  If you still have difficulty reaching the provider, please page the [facility] (Director on Call) for the Hospitalists listed on amion

## 2023-04-21 NOTE — Discharge Summary (Signed)
Physician Discharge Summary  [name] [name] ZOX:[id number] DOB: 1961/06/26 DOA: 04/19/2023  PCP: Everardo All, NP  Admit date: 04/19/2023 Discharge date: 04/21/2023 Discharging to: home Recommendations for Outpatient Follow-up:  F/u on foley cath please  Consults:  urology Procedures:   Foley 5/11     This is a 62 year old female with uncontrolled diabetes mellitus with gastroparesis, history of urinary retention in the past and hypothyroidism who presented to the hospital for nausea and vomiting and was found to have urinary retention. This is her 3rd admission these complaints.    She was admitted from 2/24 through 2/28 for DKA associated with nausea and vomiting.  EGD revealed reflux esophagitis at the time.   Subjective:  No complaints today. Eating well. No urinary issues.    Assessment and Plan: Principal Problem:   Intractable vomiting with nausea -Has a history of gastroparesis-have resumed her Reglan - She appears to be tolerating her diet today - dc IVF   Active Problems:   Acute urinary retention - noted on 2 prior admissions recently and may be causing above symptoms -voiding but has 4-500 in bladder after adequately voiding - Urology has recommended a foley cath and she initially refused and later agreed today it   Hypokalemia - Replaced     Paroxysmal A-fib ([name]) -It appears that she is only on aspirin at home - No rate controlling agents     Type 2 diabetes mellitus, severely uncontrolled with hyperglycemia Hemoglobin A1C Labs (Brief)          Component Value Date/Time    HGBA1C 13.3 (H) 02/04/2023 0346    -Has been taking 8 units of Semglee at home and Humalog - She states that when her Humalog was changed to NovoLog, she feels that her sugars became uncontrolled although the dosages were not changed     History of chronic hypotension - midodrine     Reflux esophagitis - She is on a PPI daily   Insomnia - Replaced Lunesta with Ambien  which is what we have available in the hospital   Hypothyroidism - Resume Synthroid        Discharge Instructions  Discharge Instructions     Diet - low sodium heart healthy   Complete by: As directed    Diet Carb Modified   Complete by: As directed    Increase activity slowly   Complete by: As directed       Allergies as of 04/21/2023       Reactions   Codeine Hives, Anxiety, Other (See Comments)        Medication List     STOP taking these medications    TYLENOL 500 MG tablet Generic drug: acetaminophen       TAKE these medications    aspirin EC 81 MG tablet Take 81 mg by mouth daily. Swallow whole.   atorvastatin 80 MG tablet Commonly known as: LIPITOR Take 1 tablet (80 mg total) by mouth daily.   Dexcom G6 Sensor Misc INJECT 1 SENSOR INTO SKIN EVERY 10 DAYS What changed: See the new instructions.   Dexcom G6 Transmitter Misc USE AS DIRECTED FOR CONTINUOUS GLUCOSE MONITORING. REUSE TRANSMITTER X 90DAYS THEN DISCARD & REPLACE   eletriptan 40 MG tablet Commonly known as: RELPAX Take 1 tablet (40 mg total) by mouth as needed for migraine or headache. May repeat in 2 hours if headache persists or recurs.   eszopiclone 1 MG Tabs tablet Commonly known as: LUNESTA Take 1 mg by mouth at  bedtime. Take immediately before bedtime   insulin lispro 100 UNIT/ML KwikPen Commonly known as: HumaLOG KwikPen INJECT INTO SKIN 3 TIMES A DAY AT MEAL TIME ACCORDING TO THE FOLLOWING SLIDING SCALE: 27 UNITS MAX PER DAY DX E11.43 What changed:  how much to take how to take this when to take this additional instructions   levothyroxine 137 MCG tablet Commonly known as: SYNTHROID Take 137 mcg by mouth daily before breakfast.   metoCLOPramide 5 MG tablet Commonly known as: REGLAN Take 1 tablet (5 mg total) by mouth 3 (three) times daily before meals. What changed:  when to take this additional instructions   midodrine 5 MG tablet Commonly known as:  PROAMATINE Take 1 tablet (5 mg total) by mouth 2 (two) times daily with a meal. Hold if your systolic blood pressure  ( top number) is > , please check your blood pressure at home once a day and bring in record for your doctor to review, follow up with your pcp and cardiology regarding your blood pressure medication What changed:  when to take this additional instructions   onetouch ultrasoft lancets Use to check blood sugar daily as needed.  Dx: E11.10   pantoprazole 40 MG tablet Commonly known as: Protonix Take 1 tablet (40 mg total) by mouth 2 (two) times daily.   Pen Needles 31G X 8 MM Misc Use to inject insulin 4 times a day.  Dx: E11.10   promethazine 25 MG tablet Commonly known as: PHENERGAN Take 1 tablet (25 mg total) by mouth every 8 (eight) hours as needed for nausea or vomiting.   Toujeo SoloStar 300 UNIT/ML Solostar Pen Generic drug: insulin glargine (1 Unit Dial) Inject 6 Units into the skin at bedtime. What changed: how much to take        Follow-up Information     Crist Fat, MD. Call.   Specialty: Urology Contact information: [address] [place] [address] [phone]                    The results of significant diagnostics from this hospitalization (including imaging, microbiology, ancillary and laboratory) are listed below for reference.    CT ABDOMEN PELVIS W CONTRAST  Result Date: 04/19/2023 CLINICAL DATA:  Polyuria and dysuria for 2 days, generalized abdominal pain, nausea, vomiting. EXAM: CT ABDOMEN AND PELVIS WITH CONTRAST TECHNIQUE: Multidetector CT imaging of the abdomen and pelvis was performed using the standard protocol following bolus administration of intravenous contrast. RADIATION DOSE REDUCTION: This exam was performed according to the departmental dose-optimization program which includes automated exposure control, adjustment of the mA and/or kV according to patient size and/or use of iterative reconstruction  technique. CONTRAST:  OMNIPAQUE IOHEXOL 300 MG/ML  SOLN COMPARISON:  CT abdomen/pelvis most recently 03/29/2023 FINDINGS: Lower chest: The lung bases are clear. The imaged heart is unremarkable. Hepatobiliary: The previously seen enhancing lesion in hepatic segment II is not identified on the current study. The liver is diffusely hypoattenuating suggesting fatty infiltration. There are no focal lesions on the current study. Nodular enhancement of the gallbladder fundus is unchanged since 2021 and likely reflects adenomyomatosis. The gallbladder is otherwise unremarkable. There is no biliary ductal dilatation. Pancreas: Unremarkable. Spleen: Unremarkable. Adrenals/Urinary Tract: The adrenals are unremarkable. There is mild hydroureteronephrosis due to a markedly distended urinary bladder, similar to prior studies. There are no focal parenchymal lesions. There is no stone in either kidney or along the course of either ureter. There is excretion of contrast into  the collecting systems on the delayed images. Stomach/Bowel: Mild distal esophageal wall thickening is again seen which may reflect esophagitis. The gastric mucosa is hyperenhancing. A small superiorly directed outpouching arising from the third portion of the duodenum is unchanged going back to 2022 and is consistent with a small diverticulum (2-66). There is no evidence of bowel obstruction. There is no abnormal bowel wall thickening or inflammatory change. There is colonic diverticulosis without evidence of acute diverticulitis. The appendix is normal. Vascular/Lymphatic: The abdominal aorta is normal in course and caliber with scattered calcified plaque. The major branch vessels are patent. The main portal and splenic veins are patent. There is no abdominal or pelvic lymphadenopathy. Reproductive: The uterus is surgically absent. There is no adnexal mass. Other: There is no ascites or free air. A small fat containing umbilical hernia is unchanged.  Musculoskeletal: There is no acute osseous abnormality or suspicious osseous lesion. IMPRESSION: 1. Markedly distended urinary bladder with mild bilateral hydroureteronephrosis, similar to prior studies. Correlate for retention and consider decompression. 2. Hyperenhancing gastric mucosa could reflect gastritis. Mild distal esophageal wall thickening is similar to prior studies and may reflect gastritis. 3. Hepatic steatosis. The previously seen hyperenhancing lesion in the liver is not seen on the current study. 4. Diverticulosis. Electronically Signed   By: Lesia Hausen M.D.   On: 04/19/2023 10:03   CT ABDOMEN PELVIS W CONTRAST  Result Date: 03/29/2023 CLINICAL DATA:  Vomiting and abdominal pain EXAM: CT ABDOMEN AND PELVIS WITH CONTRAST TECHNIQUE: Multidetector CT imaging of the abdomen and pelvis was performed using the standard protocol following bolus administration of intravenous contrast. RADIATION DOSE REDUCTION: This exam was performed according to the departmental dose-optimization program which includes automated exposure control, adjustment of the mA and/or kV according to patient size and/or use of iterative reconstruction technique. CONTRAST:  OMNIPAQUE IOHEXOL 300 MG/ML  SOLN COMPARISON:  CT 02/03/2023 and older FINDINGS: Lower chest: Lung bases are clear. No pleural effusion. Wall thickening of the distal esophagus. Please correlate for any symptoms of esophagitis. Hepatobiliary: Fatty liver infiltration identified. Patent portal vein small enhancing lesion seen in segment 2 of the liver on series 2, image 10 measures 6 mm. Possibilities include a flash fill hemangioma the is indeterminate. This was not clearly seen on older exams. Area of shunting is also possible. Gallbladder is nondilated. There is some fundal adenomyomatosis. This has been stable since at least July 2022. Pancreas: Unremarkable. No pancreatic ductal dilatation or surrounding inflammatory changes. Spleen: Normal in size  without focal abnormality. Adrenals/Urinary Tract: The adrenal glands are preserved. No enhancing renal mass or collecting system dilatation. The bladder is dilated. Dimensions approach 16.9 by 10.5 by 12.5 cm. Stomach/Bowel: Large bowel has a normal course and caliber with scattered stool. Few colonic diverticula. Normal appendix. Stomach is distended with fluid. Mild fold thickening of the stomach as well. Small bowel is nondilated. Vascular/Lymphatic: Mild atherosclerotic calcified plaque along the aorta and branch vessels. Normal caliber aorta and IVC. No specific abnormal lymph node enlargement identified in the abdomen and pelvis. Reproductive: Status post hysterectomy. No adnexal masses. Other: Small fat containing umbilical hernia. No free air or free fluid. Musculoskeletal: No acute or significant osseous findings. IMPRESSION: No bowel obstruction, free air or free fluid. Normal appendix. Few colonic diverticula. Dilated urinary bladder. No proximal collecting system dilatation. Please correlate for symptomatology and etiology. Persistent wall thickening along the distal esophagus and more mild along the stomach. Please correlate with any known history or symptoms. Fatty liver infiltration.  Small hyperenhancing focus in segment 2 of the liver not clearly seen previously. Possibilities include an area of shunting or flash fill hemangioma. However there is a differential. Confirmatory dynamic MRI could be considered versus short-term follow-up Electronically Signed   By: Karen Kays M.D.   On: 03/29/2023 10:02   Labs:   Basic Metabolic Panel: Recent Labs  Lab 04/19/23 0805 04/19/23 1819 04/20/23 0503 04/21/23 0504  NA 133* 137 139 134*  K 3.5 3.7 3.3* 4.1  CL 96* 104 104 101  CO2 23 22 27 22   GLUCOSE 457* 173* 83 234*  BUN 14 11 13 13   CREATININE 0.62 0.49 0.60 0.71  CALCIUM 8.5* 8.6* 8.5* 8.7*  MG  --   --   --  1.8     CBC: Recent Labs  Lab 04/19/23 0805 04/20/23 0503  WBC 13.0*  10.1  HGB 15.4* 13.3  HCT 45.9 41.3  MCV 86.0 88.4  PLT 432* 324         SIGNED:   Calvert Cantor, MD  [facility] 04/21/2023, 2:15 PM

## 2023-04-21 NOTE — Discharge Instructions (Signed)
Be sure to follow-up with Dr. Marlou Porch for evaluation of your bladder and kidneys.  Continued incomplete bladder emptying and hydronephrosis can lead to urinary tract infection, bleeding from the urinary tract, kidney failure and the need for dialysis among other risks.  Urinary Retention, Female  Acute urinary retention is when a person cannot pee (urinate) at all, or can only pee a little. This can come on all of a sudden. If it is not treated, it can lead to kidney problems or other serious problems. What are the causes? A problem with the tube that drains the bladder (urethra). Problems with the nerves in the bladder. The organs in the area between your hip bones (pelvis) slipping out of place (prolapse). Tumors. The birth of a baby through the vagina. An infection. Having trouble pooping (constipation). Certain medicines. What increases the risk? Women over age 61 are more at risk. Other conditions also can increase risk. These include: Diseases, such as multiple sclerosis. Injury to the spinal cord. Diabetes. A condition that affects the way the brain works, such as dementia. Holding back urine due to trauma or because you do not want to use the bathroom. History of not being able to pee or peeing too little. Having had surgery in the area between your hip bones. What are the signs or symptoms? Trouble peeing. Pain in the lower belly. How is this treated? Treatment for this condition may include: Medicines. Placing a thin, germ-free tube (catheter) into the bladder to drain pee out of the body. Therapy to treat mental health conditions. Treatment for conditions that may cause this. If needed, you may be treated in the hospital for kidney problems or to manage other problems. Follow these instructions at home: Medicines Take over-the-counter and prescription medicines only as told by your doctor. Ask your doctor what medicines you should stay away from. If you were given an  antibiotic medicine, take it as told by your doctor. Do not stop taking it even if you start to feel better. General instructions Do not smoke or use any products that contain nicotine or tobacco. If you need help quitting, ask your doctor. Drink enough fluid to keep your pee pale yellow. If you were sent home with a tube that drains the bladder, take care of it as told by your doctor. Watch for changes in your symptoms. Tell your doctor about them. If told, keep track of changes in your blood pressure at home. Tell your doctor about them. Keep all follow-up visits. Contact a doctor if: You have spasms in your bladder that you cannot stop. You leak pee when you have spasms. Get help right away if: You have chills or a fever. You have blood in your pee. You have a tube that drains pee from the bladder and these things happen: The tube stops draining pee. The tube falls out. Summary Acute urinary retention is when you cannot pee at all or you pee too little. If this is not treated, it can cause kidney problems or other serious problems. If you were sent home with a tube (catheter) that drains pee from the bladder, take care of it as told by your doctor. Watch for changes in your symptoms. Tell your doctor about them.  Hydronephrosis  Hydronephrosis is the swelling of one or both kidneys due to a blockage that stops urine from flowing out of the body. Kidneys filter waste from the blood and produce urine. This condition can lead to kidney failure and may become life-threatening  if not treated promptly. What are the causes? In infants and children, common causes include problems that occur when a baby is developing in the womb. These can include problems in the kidneys or in the tubes that drain urine into the bladder (ureters). In adults, common causes include: Kidney stones. Pregnancy. A tumor or cyst in the abdomen or pelvis. URINARY RETENTION  What are the signs or  symptoms? Symptoms of this condition include: Pain or discomfort in your side (flank) or abdomen. Swelling in your abdomen. Nausea and vomiting. Fever. Pain when passing urine. Feelings of urgency when you need to urinate. Urinating more often than normal. No symptoms at all - can be "silent"  In some cases, you may not have any symptoms. How is this diagnosed? This condition may be diagnosed based on: Your symptoms and medical history. A physical exam. Blood and urine tests. Imaging tests, such as an ultrasound, CT scan, or MRI. A procedure to look at your urinary tract and bladder by inserting a scope into the urethra (cystoscopy). How is this treated? Treatment for this condition depends on where the blockage is, how long it has been there, and what caused it. The goal of treatment is to remove the blockage. Treatment may include: Antibiotic medicines to treat or prevent infection. A procedure to place a small, thin tube (stent) into a blocked ureter. The stent will keep the ureter open so that urine can drain through it. A nonsurgical procedure that crushes kidney stones with shock waves (extracorporeal shock wave lithotripsy). If kidney failure occurs, treatment may include dialysis or a kidney transplant. Follow these instructions at home:  Take over-the-counter and prescription medicines only as told by your health care provider. If you were prescribed an antibiotic medicine, take it exactly as told by your health care provider. Do not stop taking the antibiotic even if you start to feel better. Rest and return to your normal activities as told by your health care provider. Ask your health care provider what activities are safe for you. Drink enough fluid to keep your urine pale yellow. Keep all follow-up visits. This is important. Contact a health care provider if: You continue to have symptoms after treatment. You develop new symptoms. Your urine becomes cloudy or  bloody. You have a fever. Get help right away if: You have severe flank or abdominal pain. You cannot drink fluids without vomiting. Summary Hydronephrosis is the swelling of one or both kidneys due to a blockage that stops urine from flowing out of the body. Hydronephrosis can lead to kidney failure and may become life-threatening if not treated promptly. The goal of treatment is to remove the blockage. It may include a procedure to insert a stent into a blocked ureter, a procedure to break up kidney stones, or taking antibiotic medicines. Follow your health care provider's instructions for taking care of yourself at home, including instructions about drinking fluids, taking medicines, and limiting activities. This information is not intended to replace advice given to you by your health care provider. Make sure you discuss any questions you have with your health care provider. Document Revised: 03/15/2020 Document Reviewed: 03/15/2020 Elsevier Patient Education  2023 [employer].

## 2023-05-01 DIAGNOSIS — R338 Other retention of urine: Secondary | ICD-10-CM | POA: Diagnosis not present

## 2023-05-03 DIAGNOSIS — E1169 Type 2 diabetes mellitus with other specified complication: Secondary | ICD-10-CM | POA: Diagnosis not present

## 2023-05-03 DIAGNOSIS — I10 Essential (primary) hypertension: Secondary | ICD-10-CM | POA: Diagnosis not present

## 2023-05-03 DIAGNOSIS — Z1231 Encounter for screening mammogram for malignant neoplasm of breast: Secondary | ICD-10-CM | POA: Diagnosis not present

## 2023-05-03 DIAGNOSIS — E785 Hyperlipidemia, unspecified: Secondary | ICD-10-CM | POA: Diagnosis not present

## 2023-05-03 DIAGNOSIS — K3184 Gastroparesis: Secondary | ICD-10-CM | POA: Diagnosis not present

## 2023-05-03 DIAGNOSIS — E1159 Type 2 diabetes mellitus with other circulatory complications: Secondary | ICD-10-CM | POA: Diagnosis not present

## 2023-05-03 DIAGNOSIS — I152 Hypertension secondary to endocrine disorders: Secondary | ICD-10-CM | POA: Diagnosis not present

## 2023-05-03 DIAGNOSIS — G4709 Other insomnia: Secondary | ICD-10-CM | POA: Diagnosis not present

## 2023-05-14 ENCOUNTER — Inpatient Hospital Stay (HOSPITAL_COMMUNITY)
Admission: EM | Admit: 2023-05-14 | Discharge: 2023-05-16 | DRG: 699 | Disposition: A | Discharge status: Home / Self Care | Payer: BC Managed Care – PPO | Attending: Family Medicine | Admitting: Family Medicine

## 2023-05-14 ENCOUNTER — Emergency Department (HOSPITAL_COMMUNITY): Payer: BC Managed Care – PPO

## 2023-05-14 ENCOUNTER — Other Ambulatory Visit: Payer: Self-pay

## 2023-05-14 ENCOUNTER — Encounter (HOSPITAL_COMMUNITY): Payer: Self-pay | Admitting: Family Medicine

## 2023-05-14 DIAGNOSIS — I130 Hypertensive heart and chronic kidney disease with heart failure and stage 1 through stage 4 chronic kidney disease, or unspecified chronic kidney disease: Secondary | ICD-10-CM | POA: Diagnosis present

## 2023-05-14 DIAGNOSIS — Z91148 Patient's other noncompliance with medication regimen for other reason: Secondary | ICD-10-CM

## 2023-05-14 DIAGNOSIS — N3090 Cystitis, unspecified without hematuria: Secondary | ICD-10-CM | POA: Diagnosis not present

## 2023-05-14 DIAGNOSIS — I5042 Chronic combined systolic (congestive) and diastolic (congestive) heart failure: Secondary | ICD-10-CM | POA: Diagnosis present

## 2023-05-14 DIAGNOSIS — M797 Fibromyalgia: Secondary | ICD-10-CM | POA: Diagnosis present

## 2023-05-14 DIAGNOSIS — E1065 Type 1 diabetes mellitus with hyperglycemia: Secondary | ICD-10-CM | POA: Diagnosis present

## 2023-05-14 DIAGNOSIS — T83518A Infection and inflammatory reaction due to other urinary catheter, initial encounter: Principal | ICD-10-CM | POA: Diagnosis present

## 2023-05-14 DIAGNOSIS — M810 Age-related osteoporosis without current pathological fracture: Secondary | ICD-10-CM | POA: Diagnosis present

## 2023-05-14 DIAGNOSIS — R739 Hyperglycemia, unspecified: Secondary | ICD-10-CM

## 2023-05-14 DIAGNOSIS — E1022 Type 1 diabetes mellitus with diabetic chronic kidney disease: Secondary | ICD-10-CM | POA: Diagnosis not present

## 2023-05-14 DIAGNOSIS — Z794 Long term (current) use of insulin: Secondary | ICD-10-CM | POA: Diagnosis not present

## 2023-05-14 DIAGNOSIS — Y846 Urinary catheterization as the cause of abnormal reaction of the patient, or of later complication, without mention of misadventure at the time of the procedure: Secondary | ICD-10-CM | POA: Diagnosis present

## 2023-05-14 DIAGNOSIS — I2510 Atherosclerotic heart disease of native coronary artery without angina pectoris: Secondary | ICD-10-CM | POA: Diagnosis not present

## 2023-05-14 DIAGNOSIS — Z79890 Hormone replacement therapy (postmenopausal): Secondary | ICD-10-CM

## 2023-05-14 DIAGNOSIS — E109 Type 1 diabetes mellitus without complications: Secondary | ICD-10-CM

## 2023-05-14 DIAGNOSIS — N182 Chronic kidney disease, stage 2 (mild): Secondary | ICD-10-CM | POA: Diagnosis present

## 2023-05-14 DIAGNOSIS — T383X6A Underdosing of insulin and oral hypoglycemic [antidiabetic] drugs, initial encounter: Secondary | ICD-10-CM | POA: Diagnosis present

## 2023-05-14 DIAGNOSIS — Z90710 Acquired absence of both cervix and uterus: Secondary | ICD-10-CM

## 2023-05-14 DIAGNOSIS — I428 Other cardiomyopathies: Secondary | ICD-10-CM

## 2023-05-14 DIAGNOSIS — K3184 Gastroparesis: Secondary | ICD-10-CM | POA: Diagnosis present

## 2023-05-14 DIAGNOSIS — K219 Gastro-esophageal reflux disease without esophagitis: Secondary | ICD-10-CM | POA: Diagnosis present

## 2023-05-14 DIAGNOSIS — Z79899 Other long term (current) drug therapy: Secondary | ICD-10-CM

## 2023-05-14 DIAGNOSIS — N390 Urinary tract infection, site not specified: Secondary | ICD-10-CM | POA: Insufficient documentation

## 2023-05-14 DIAGNOSIS — F411 Generalized anxiety disorder: Secondary | ICD-10-CM | POA: Diagnosis present

## 2023-05-14 DIAGNOSIS — E1042 Type 1 diabetes mellitus with diabetic polyneuropathy: Secondary | ICD-10-CM | POA: Diagnosis present

## 2023-05-14 DIAGNOSIS — R110 Nausea: Secondary | ICD-10-CM | POA: Diagnosis present

## 2023-05-14 DIAGNOSIS — E785 Hyperlipidemia, unspecified: Secondary | ICD-10-CM | POA: Diagnosis present

## 2023-05-14 DIAGNOSIS — R9431 Abnormal electrocardiogram [ECG] [EKG]: Secondary | ICD-10-CM | POA: Diagnosis not present

## 2023-05-14 DIAGNOSIS — E1165 Type 2 diabetes mellitus with hyperglycemia: Secondary | ICD-10-CM | POA: Diagnosis not present

## 2023-05-14 DIAGNOSIS — B961 Klebsiella pneumoniae [K. pneumoniae] as the cause of diseases classified elsewhere: Secondary | ICD-10-CM | POA: Diagnosis not present

## 2023-05-14 DIAGNOSIS — R300 Dysuria: Secondary | ICD-10-CM | POA: Diagnosis present

## 2023-05-14 DIAGNOSIS — I951 Orthostatic hypotension: Secondary | ICD-10-CM | POA: Diagnosis present

## 2023-05-14 DIAGNOSIS — M5412 Radiculopathy, cervical region: Secondary | ICD-10-CM | POA: Diagnosis present

## 2023-05-14 DIAGNOSIS — I480 Paroxysmal atrial fibrillation: Secondary | ICD-10-CM | POA: Diagnosis present

## 2023-05-14 DIAGNOSIS — K589 Irritable bowel syndrome without diarrhea: Secondary | ICD-10-CM | POA: Diagnosis present

## 2023-05-14 DIAGNOSIS — Z87891 Personal history of nicotine dependence: Secondary | ICD-10-CM

## 2023-05-14 DIAGNOSIS — R339 Retention of urine, unspecified: Secondary | ICD-10-CM

## 2023-05-14 DIAGNOSIS — Z8249 Family history of ischemic heart disease and other diseases of the circulatory system: Secondary | ICD-10-CM

## 2023-05-14 DIAGNOSIS — I5032 Chronic diastolic (congestive) heart failure: Secondary | ICD-10-CM | POA: Diagnosis present

## 2023-05-14 DIAGNOSIS — E1043 Type 1 diabetes mellitus with diabetic autonomic (poly)neuropathy: Secondary | ICD-10-CM | POA: Diagnosis present

## 2023-05-14 DIAGNOSIS — G901 Familial dysautonomia [Riley-Day]: Secondary | ICD-10-CM | POA: Diagnosis not present

## 2023-05-14 DIAGNOSIS — E039 Hypothyroidism, unspecified: Secondary | ICD-10-CM | POA: Diagnosis not present

## 2023-05-14 DIAGNOSIS — Z91138 Patient's unintentional underdosing of medication regimen for other reason: Secondary | ICD-10-CM

## 2023-05-14 DIAGNOSIS — Z7982 Long term (current) use of aspirin: Secondary | ICD-10-CM

## 2023-05-14 DIAGNOSIS — T381X6A Underdosing of thyroid hormones and substitutes, initial encounter: Secondary | ICD-10-CM | POA: Diagnosis present

## 2023-05-14 DIAGNOSIS — Z833 Family history of diabetes mellitus: Secondary | ICD-10-CM

## 2023-05-14 DIAGNOSIS — E1143 Type 2 diabetes mellitus with diabetic autonomic (poly)neuropathy: Secondary | ICD-10-CM | POA: Diagnosis present

## 2023-05-14 DIAGNOSIS — Z820 Family history of epilepsy and other diseases of the nervous system: Secondary | ICD-10-CM

## 2023-05-14 DIAGNOSIS — R531 Weakness: Secondary | ICD-10-CM | POA: Diagnosis not present

## 2023-05-14 DIAGNOSIS — I5022 Chronic systolic (congestive) heart failure: Secondary | ICD-10-CM | POA: Diagnosis present

## 2023-05-14 LAB — CBC WITH DIFFERENTIAL/PLATELET
Abs Immature Granulocytes: 0.06 10*3/uL (ref 0.00–0.07)
Basophils Absolute: 0.1 10*3/uL (ref 0.0–0.1)
Basophils Relative: 1 %
Eosinophils Absolute: 0.1 10*3/uL (ref 0.0–0.5)
Eosinophils Relative: 1 %
HCT: 48.6 % — ABNORMAL HIGH (ref 36.0–46.0)
Hemoglobin: 16.2 g/dL — ABNORMAL HIGH (ref 12.0–15.0)
Immature Granulocytes: 1 %
Lymphocytes Relative: 18 %
Lymphs Abs: 2.2 10*3/uL (ref 0.7–4.0)
MCH: 28.4 pg (ref 26.0–34.0)
MCHC: 33.3 g/dL (ref 30.0–36.0)
MCV: 85.3 fL (ref 80.0–100.0)
Monocytes Absolute: 0.5 10*3/uL (ref 0.1–1.0)
Monocytes Relative: 4 %
Neutro Abs: 8.9 10*3/uL — ABNORMAL HIGH (ref 1.7–7.7)
Neutrophils Relative %: 75 %
Platelets: 382 10*3/uL (ref 150–400)
RBC: 5.7 MIL/uL — ABNORMAL HIGH (ref 3.87–5.11)
RDW: 14.6 % (ref 11.5–15.5)
WBC: 11.7 10*3/uL — ABNORMAL HIGH (ref 4.0–10.5)
nRBC: 0 % (ref 0.0–0.2)

## 2023-05-14 LAB — RAPID URINE DRUG SCREEN, HOSP PERFORMED
Amphetamines: NOT DETECTED
Barbiturates: NOT DETECTED
Benzodiazepines: NOT DETECTED
Cocaine: NOT DETECTED
Opiates: NOT DETECTED
Tetrahydrocannabinol: POSITIVE — AB

## 2023-05-14 LAB — COMPREHENSIVE METABOLIC PANEL
ALT: 12 U/L (ref 0–44)
AST: 15 U/L (ref 15–41)
Albumin: 4.1 g/dL (ref 3.5–5.0)
Alkaline Phosphatase: 72 U/L (ref 38–126)
Anion gap: 15 (ref 5–15)
BUN: 14 mg/dL (ref 6–20)
CO2: 26 mmol/L (ref 22–32)
Calcium: 9.1 mg/dL (ref 8.9–10.3)
Chloride: 95 mmol/L — ABNORMAL LOW (ref 98–111)
Creatinine, Ser: 0.59 mg/dL (ref 0.44–1.00)
GFR, Estimated: >60 mL/min (ref >60)
Glucose, Bld: 351 mg/dL — ABNORMAL HIGH (ref 70–99)
Potassium: 3.7 mmol/L (ref 3.5–5.1)
Sodium: 136 mmol/L (ref 135–145)
Total Bilirubin: 0.7 mg/dL (ref 0.3–1.2)
Total Protein: 8 g/dL (ref 6.5–8.1)

## 2023-05-14 LAB — LACTIC ACID, PLASMA
Lactic Acid, Venous: 1.4 mmol/L (ref 0.5–1.9)
Lactic Acid, Venous: 1.3 mmol/L (ref 0.5–1.9)

## 2023-05-14 LAB — URINALYSIS, W/ REFLEX TO CULTURE (INFECTION SUSPECTED)
Bilirubin Urine: NEGATIVE
Glucose, UA: >=500 mg/dL — AB
Hgb urine dipstick: NEGATIVE
Ketones, ur: 20 mg/dL — AB
Nitrite: NEGATIVE
Protein, ur: 30 mg/dL — AB
Specific Gravity, Urine: 1.026 (ref 1.005–1.030)
pH: 5 (ref 5.0–8.0)

## 2023-05-14 LAB — CULTURE, BLOOD (ROUTINE X 2)

## 2023-05-14 LAB — TSH: TSH: 53.727 u[IU]/mL — ABNORMAL HIGH (ref 0.350–4.500)

## 2023-05-14 LAB — LIPASE, BLOOD: Lipase: 68 U/L — ABNORMAL HIGH (ref 11–51)

## 2023-05-14 LAB — CBG MONITORING, ED
Glucose-Capillary: 379 mg/dL — ABNORMAL HIGH (ref 70–99)
Glucose-Capillary: 276 mg/dL — ABNORMAL HIGH (ref 70–99)
Glucose-Capillary: 252 mg/dL — ABNORMAL HIGH (ref 70–99)

## 2023-05-14 MED ORDER — INSULIN ASPART 100 UNIT/ML IJ SOLN
6.0000 [IU] | Freq: Once | INTRAMUSCULAR | Status: AC
  Administered 2023-05-14: 6 [IU] via SUBCUTANEOUS
  Filled 2023-05-14: qty 0.06

## 2023-05-14 MED ORDER — ATORVASTATIN CALCIUM 40 MG PO TABS
80.0000 mg | ORAL_TABLET | Freq: Every day | ORAL | Status: DC
  Administered 2023-05-14 – 2023-05-16 (×3): 80 mg via ORAL
  Filled 2023-05-14 (×3): qty 2

## 2023-05-14 MED ORDER — ACETAMINOPHEN 650 MG RE SUPP: 650.0000 mg | Freq: Four times a day (QID) | RECTAL | Status: DC | PRN

## 2023-05-14 MED ORDER — ASPIRIN 81 MG PO TBEC
81.0000 mg | DELAYED_RELEASE_TABLET | Freq: Every day | ORAL | Status: DC
  Administered 2023-05-14 – 2023-05-16 (×3): 81 mg via ORAL
  Filled 2023-05-14 (×3): qty 1

## 2023-05-14 MED ORDER — ONDANSETRON HCL 4 MG PO TABS: 4.0000 mg | ORAL_TABLET | Freq: Four times a day (QID) | ORAL | Status: DC | PRN

## 2023-05-14 MED ORDER — ENOXAPARIN SODIUM 40 MG/0.4ML IJ SOSY
40.0000 mg | PREFILLED_SYRINGE | INTRAMUSCULAR | Status: DC
  Administered 2023-05-14 – 2023-05-15 (×2): 40 mg via SUBCUTANEOUS
  Filled 2023-05-14 (×2): qty 0.4

## 2023-05-14 MED ORDER — INSULIN GLARGINE (1 UNIT DIAL) 300 UNIT/ML ~~LOC~~ SOPN: 8.0000 [IU] | PEN_INJECTOR | Freq: Every day | SUBCUTANEOUS | Status: DC

## 2023-05-14 MED ORDER — INSULIN GLARGINE-YFGN 100 UNIT/ML ~~LOC~~ SOLN
10.0000 [IU] | Freq: Every day | SUBCUTANEOUS | Status: DC
  Administered 2023-05-14 – 2023-05-15 (×2): 10 [IU] via SUBCUTANEOUS
  Filled 2023-05-14 (×4): qty 0.1

## 2023-05-14 MED ORDER — SODIUM CHLORIDE (PF) 0.9 % IJ SOLN
INTRAMUSCULAR | Status: AC
  Filled 2023-05-14: qty 50

## 2023-05-14 MED ORDER — MIDODRINE HCL 5 MG PO TABS
5.0000 mg | ORAL_TABLET | Freq: Two times a day (BID) | ORAL | Status: DC
  Administered 2023-05-15 (×2): 5 mg via ORAL
  Filled 2023-05-14 (×4): qty 1

## 2023-05-14 MED ORDER — SODIUM CHLORIDE 0.9 % IV SOLN
1.0000 g | Freq: Once | INTRAVENOUS | Status: AC
  Administered 2023-05-14: 1 g via INTRAVENOUS
  Filled 2023-05-14: qty 10

## 2023-05-14 MED ORDER — IOHEXOL 300 MG/ML  SOLN
100.0000 mL | Freq: Once | INTRAMUSCULAR | Status: AC | PRN
  Administered 2023-05-14: 100 mL via INTRAVENOUS

## 2023-05-14 MED ORDER — ONDANSETRON HCL 4 MG/2ML IJ SOLN: 4.0000 mg | Freq: Four times a day (QID) | INTRAMUSCULAR | Status: DC | PRN

## 2023-05-14 MED ORDER — SODIUM CHLORIDE 0.9 % IV SOLN
1.0000 g | INTRAVENOUS | Status: DC
  Administered 2023-05-15: 1 g via INTRAVENOUS
  Filled 2023-05-14 (×2): qty 10

## 2023-05-14 MED ORDER — METOCLOPRAMIDE HCL 5 MG PO TABS
5.0000 mg | ORAL_TABLET | Freq: Two times a day (BID) | ORAL | Status: DC
  Administered 2023-05-15 – 2023-05-16 (×3): 5 mg via ORAL
  Filled 2023-05-14 (×3): qty 1

## 2023-05-14 MED ORDER — PANTOPRAZOLE SODIUM 40 MG PO TBEC
40.0000 mg | DELAYED_RELEASE_TABLET | Freq: Two times a day (BID) | ORAL | Status: DC
  Administered 2023-05-14 – 2023-05-16 (×4): 40 mg via ORAL
  Filled 2023-05-14 (×4): qty 1

## 2023-05-14 MED ORDER — ACETAMINOPHEN 325 MG PO TABS
650.0000 mg | ORAL_TABLET | Freq: Four times a day (QID) | ORAL | Status: DC | PRN
  Administered 2023-05-16: 650 mg via ORAL
  Filled 2023-05-14: qty 2

## 2023-05-14 MED ORDER — ONDANSETRON HCL 4 MG/2ML IJ SOLN
4.0000 mg | Freq: Once | INTRAMUSCULAR | Status: AC
  Administered 2023-05-14: 4 mg via INTRAVENOUS
  Filled 2023-05-14: qty 2

## 2023-05-14 MED ORDER — METOCLOPRAMIDE HCL 5 MG/ML IJ SOLN
10.0000 mg | Freq: Once | INTRAMUSCULAR | Status: AC
  Administered 2023-05-14: 10 mg via INTRAVENOUS
  Filled 2023-05-14: qty 2

## 2023-05-14 MED ORDER — LEVOTHYROXINE SODIUM 25 MCG PO TABS
137.0000 ug | ORAL_TABLET | Freq: Every day | ORAL | Status: DC
  Administered 2023-05-15 – 2023-05-16 (×2): 137 ug via ORAL
  Filled 2023-05-14 (×2): qty 1

## 2023-05-14 MED ORDER — SODIUM CHLORIDE 0.9 % IV BOLUS
1000.0000 mL | Freq: Once | INTRAVENOUS | Status: AC
  Administered 2023-05-14: 1000 mL via INTRAVENOUS

## 2023-05-14 MED ORDER — SODIUM CHLORIDE 0.9 % IV SOLN: INTRAVENOUS | Status: AC

## 2023-05-14 MED ORDER — INSULIN ASPART 100 UNIT/ML IJ SOLN
0.0000 [IU] | Freq: Three times a day (TID) | INTRAMUSCULAR | Status: DC
  Administered 2023-05-14: 5 [IU] via SUBCUTANEOUS
  Administered 2023-05-15: 8 [IU] via SUBCUTANEOUS
  Administered 2023-05-15: 2 [IU] via SUBCUTANEOUS
  Administered 2023-05-15: 3 [IU] via SUBCUTANEOUS
  Administered 2023-05-16: 5 [IU] via SUBCUTANEOUS
  Administered 2023-05-16: 11 [IU] via SUBCUTANEOUS
  Filled 2023-05-14: qty 0.15

## 2023-05-14 NOTE — Assessment & Plan Note (Addendum)
TSH was 24 in February and has not been checked.  Non compliant. She states since May she has been taking daily.  TSH/free T4 pending Continue synthroid for now

## 2023-05-14 NOTE — Assessment & Plan Note (Signed)
Chronic nausea/vomiting and hx of gastroparesis.  MJ use: check UDS CT abdomen with no acute findings  Continue IVF and anti-emetics.

## 2023-05-14 NOTE — Assessment & Plan Note (Addendum)
-  poorly controlled, non compliant. Admits to missing insulin doses.  -complaints of high sugar, but appears to run high  -A1C in 01/2023: 13.3 -not in DKA on arrival, but likely heading in that direction with possible UTI  -no other sources of infection -lactic acid wnl, no SIRS criteria -continue IVF  -continue long acting insulin. Increase to 8 units. Will hold pre meal for now as she doesn't have much of an appetite.  -SSI and accuchecks qac/hs -repeat A1C pending

## 2023-05-14 NOTE — Assessment & Plan Note (Signed)
Continue PPI BID 

## 2023-05-14 NOTE — Assessment & Plan Note (Signed)
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 Ventriculogram demonstrated normal LV function. 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  -just established with cardiology at [facility] in 02/2023 -continue ranexa -continue statin/ASA

## 2023-05-14 NOTE — Assessment & Plan Note (Addendum)
-  Dry on exam  -On no medical management -IVF x 15 hours -strict I/O -daily weights  ECHO 11/23/2021: EF 55 to 60%, grade 1 diastolic dysfunction. Normal, mitral valve normal, aortic valve tricuspid, normal no pulmonary hypertension

## 2023-05-14 NOTE — H&P (Addendum)
History and Physical    Patient: [name] [name]:[id number] DOB: Oct 24, 1961 DOA: 05/14/2023 DOS: the patient was seen and examined on 05/14/2023 PCP: Everardo All, NP  Patient coming from: Home - lives with her husband. Ambulates independently.    Chief Complaint: high blood sugars, abdominal pain, decreased urine output.   HPI: [name] [name] is a 62 y.o. female with medical history significant of poorly controlled T1DM, CHF, GERD, HTN, HLD, hypothyroidism, IBS, CKD stage 2, peripheral neuropathy who presented to ED with complaints of high sugars and lower abdominal pain. She also has a history of urinary retention and self cathing three times a day for a few weeks. She states she has seen urology who told her to self cath. Her last attempt was this morning with minimal output. She states she started to have lower abdominal pain this morning. Pain 10/10 and sharp in nature. It didn't radiate. Nothing really made it better except for lying down. No changes in bowel habits. No flank pain. No fever/chills. She states she has been nauseated, but no vomiting. She states she has been feeling good up till today. She hasn't really drank much or ate much because she didn't want to throw up. She does have some dysuria. No urgency or frequency. Odor to her urine, no blood.    Denies any fever/chills, vision changes/headaches, chest pain or palpitations, shortness of breath or cough, diarrhea, or leg swelling.   She does not smoke or drink alcohol. Admits to MJ every and now and then.   ER Course:  vitals: afebrile, bp: 136/111, HR: 97, RR: 18, oxygen: 100%RA Pertinent labs: wbc: 11.7, hgb: 16.2, urine: 20 ketones, 21-50 WBC, rare bacteria,  CXR: no acute finding CT abdomen/pelvis: no acute finding In ED: 1L IVF bolus given, reglan, zofran, 6 units insulin and rocephin. BC obtained. Foley placed. TRH asked to admit.     Review of Systems: As mentioned in the history of present illness. All other  systems reviewed and are negative. Past Medical History:  Diagnosis Date   Allergy    seasonal   Anxiety    B12 deficiency    Chest pain 04/05/2017   CHF (congestive heart failure) ([name])    Diabetes mellitus type 1, uncontrolled    "dr. 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) 10/29/2013   Leg cramping    "@ night" (09/18/2013)   Migraine    "once q couple months" (11/26/2018)   Osteoporosis    Peripheral neuropathy    Umbilical hernia    unrepaired (09/18/2013)   Past Surgical History:  Procedure Laterality Date   BIOPSY  06/03/2019   Procedure: BIOPSY;  Surgeon: Lynann Bologna, MD;  Location: [facility];  Service: Endoscopy;;   BIOPSY  08/17/2020   Procedure: BIOPSY;  Surgeon: Tressia Danas, MD;  Location: [facility];  Service: Gastroenterology;;   BIOPSY  02/05/2023   Procedure: BIOPSY;  Surgeon: Meryl Dare, MD;  Location: [facility];  Service: Gastroenterology;;   CESAREAN SECTION  1989   ENTEROSCOPY N/A 06/03/2019   Procedure: ENTEROSCOPY;  Surgeon: Lynann Bologna, MD;  Location: [facility];  Service: Endoscopy;  Laterality: N/A;   ESOPHAGOGASTRODUODENOSCOPY (EGD) WITH PROPOFOL N/A 08/17/2020   Procedure:  ESOPHAGOGASTRODUODENOSCOPY (EGD) WITH PROPOFOL;  Surgeon: Tressia Danas, MD;  Location: [facility];  Service: Gastroenterology;  Laterality: N/A;   ESOPHAGOGASTRODUODENOSCOPY (EGD) WITH PROPOFOL N/A 02/05/2023   Procedure: ESOPHAGOGASTRODUODENOSCOPY (EGD) WITH PROPOFOL;  Surgeon: Meryl Dare, MD;  Location: [facility];  Service: Gastroenterology;  Laterality: N/A;   LAPAROSCOPIC ASSISTED VAGINAL HYSTERECTOMY  09/23/2012   Procedure: LAPAROSCOPIC ASSISTED VAGINAL HYSTERECTOMY;  Surgeon:  Mitchel Honour, DO;  Location: [facility];  Service: Gynecology;  Laterality: N/A;  pull Dr Vance Gather instrument   LEFT HEART CATH AND CORONARY ANGIOGRAPHY N/A 02/11/2017   Procedure: Left Heart Cath and Coronary Angiography;  Surgeon: Runell Gess, MD;  Location: [facility] INV[facility] CV [facility];  Service: Cardiovascular;  Laterality: N/A;   TUBAL LIGATION Bilateral 1992   Social History:  reports that she quit smoking about 32 years ago. Her smoking use included cigarettes. She has a 1.20 pack-year smoking history. She has never used smokeless tobacco. She reports that she does not currently use drugs after having used the following drugs: Marijuana. She reports that she does not drink alcohol.  Allergies  Allergen Reactions   Codeine Hives, Anxiety and Other (See Comments)    Family History  Problem Relation Age of Onset   Diabetes Other    Heart disease Other    Heart failure Mother 59       Her heart skipped a beat and stopped - per pt   Diabetes Maternal Grandmother    Alzheimer's disease Maternal Grandmother    Coronary artery disease Maternal Grandmother    Heart attack Maternal Grandfather    Heart failure Brother    Diabetes Brother    Colon polyps Neg Hx    Esophageal cancer Neg Hx    Liver cancer Neg Hx    Pancreatic cancer Neg Hx    Stomach cancer Neg Hx    Crohn's disease Neg Hx    Rectal cancer Neg Hx     Prior to Admission medications   Medication Sig Start Date End Date Taking? Authorizing Provider  aspirin EC 81 MG tablet Take 81 mg by mouth daily. Swallow whole.    [provider]  atorvastatin (LIPITOR) 80 MG tablet Take 1 tablet (80 mg total) by mouth daily. Patient not taking: Reported on 04/19/2023 01/12/22   Excell Seltzer, MD  Continuous Blood Gluc Sensor (DEXCOM G6 SENSOR) MISC INJECT 1 SENSOR INTO SKIN EVERY 10 DAYS Patient taking differently: Inject 1 Device into the skin See admin instructions. Place 1 new device into the skin every 10 days 07/18/22   Excell Seltzer, MD  Continuous Blood Gluc Transmit (DEXCOM G6 TRANSMITTER) MISC USE AS DIRECTED FOR CONTINUOUS GLUCOSE MONITORING. REUSE TRANSMITTER X 90DAYS THEN DISCARD & REPLACE 07/05/21   Bedsole, Amy E, MD  eletriptan (RELPAX) 40 MG tablet Take 1 tablet (40 mg total) by mouth as needed for migraine or headache. May repeat in 2 hours if headache persists or recurs. Patient not taking: Reported on 04/19/2023 08/14/22   Excell Seltzer, MD  eszopiclone (LUNESTA) 1 MG TABS tablet Take 1 mg by mouth at bedtime. Take immediately before bedtime    [provider]  insulin lispro (HUMALOG KWIKPEN) 100 UNIT/ML KwikPen INJECT INTO SKIN 3 TIMES A DAY AT MEAL TIME ACCORDING TO THE FOLLOWING SLIDING SCALE: 27 UNITS MAX PER DAY DX E11.43 Patient taking differently: Inject 4 Units into the skin See admin instructions. Inject 4 units into the skin three times a day before meals, per sliding scale  02/26/22   Bedsole, Amy E, MD  Insulin Pen Needle (PEN NEEDLES) 31G X 8 MM MISC Use to inject insulin 4 times a day.  Dx: E11.10 07/21/21   Excell Seltzer, MD  Lancets (ONETOUCH ULTRASOFT) lancets Use to check blood sugar daily as needed.  Dx: E11.10 12/23/17   Excell Seltzer, MD  levothyroxine (SYNTHROID) 137 MCG tablet Take 137 mcg by mouth daily before breakfast. 03/19/22   [provider]  metoCLOPramide (REGLAN) 5 MG tablet Take 1 tablet (5 mg total) by mouth 3 (three) times daily before meals. Patient taking differently: Take 5 mg by mouth See admin instructions. Take 5 mg by mouth three times a day before meals as needed for heartburn/G.I. symptoms 02/06/23   Albertine Grates, MD  midodrine (PROAMATINE) 5 MG tablet Take 1 tablet (5 mg total) by mouth 2 (two) times daily with a meal. Hold if your systolic blood pressure  ( top number) is > , please check your blood pressure at home once a day and bring in record for your doctor to review, follow up with your pcp and cardiology regarding your blood pressure  medication Patient taking differently: Take 5 mg by mouth See admin instructions. Take 5 mg by mouth two times a day with meals and hold if the systolic blood pressure (top number) is > 140 mmhg. Additionally, "please check your blood pressure at home once a day and bring in record for your doctor to review. Follow up with your pcp and cardiology regarding your blood pressure medication." 02/06/23   Albertine Grates, MD  pantoprazole (PROTONIX) 40 MG tablet Take 1 tablet (40 mg total) by mouth 2 (two) times daily. 02/06/23 04/19/23  Albertine Grates, MD  promethazine (PHENERGAN) 25 MG tablet Take 1 tablet (25 mg total) by mouth every 8 (eight) hours as needed for nausea or vomiting. 01/05/22   Bedsole, Amy E, MD  TOUJEO SOLOSTAR 300 UNIT/ML Solostar Pen Inject 6 Units into the skin at bedtime. Patient taking differently: Inject 8 Units into the skin at bedtime. 03/29/22   Alba Cory, MD    Physical Exam: Vitals:   05/14/23 1545 05/14/23 1730 05/14/23 1738 05/14/23 1810  BP:  114/76  (!) 89/74  Pulse: 99 89  91  Resp: 14 16  18   Temp:   98.2 F (36.8 C) 98.3 F (36.8 C)  TempSrc:   Oral Oral  SpO2: 99% 98%  100%  Weight:      Height:       General:  Appears calm and comfortable and is in NAD Eyes:  PERRL, EOMI, normal lids, iris ENT:  grossly normal hearing, lips & tongue, dry mucous membranes poor dentition Neck:  no LAD, masses or thyromegaly; no carotid bruits Cardiovascular:  RRR, no m/r/g. No LE edema.  Respiratory:   CTA bilaterally with no wheezes/rales/rhonchi.  Normal respiratory effort. Abdomen:  soft, NT, ND, NABS Back:   normal alignment, no CVAT Skin:  no rash or induration seen on limited exam. Bruising on upper extremities  Musculoskeletal:  grossly normal tone BUE/BLE, good ROM, no bony abnormality Lower extremity:  No LE edema.  Limited foot exam with no ulcerations.  2+ distal pulses. Psychiatric:  grossly normal mood and affect, speech fluent and appropriate, AOx3 Neurologic:   CN 2-12 grossly intact, moves all extremities in coordinated fashion, sensation intact   Radiological Exams on Admission: Independently reviewed - see discussion in A/P where applicable  CT ABDOMEN PELVIS W CONTRAST  Result Date:  05/14/2023 CLINICAL DATA:  Hyperglycemia, abdominal pain, and urinary retention EXAM: CT ABDOMEN AND PELVIS WITH CONTRAST TECHNIQUE: Multidetector CT imaging of the abdomen and pelvis was performed using the standard protocol following bolus administration of intravenous contrast. RADIATION DOSE REDUCTION: This exam was performed according to the departmental dose-optimization program which includes automated exposure control, adjustment of the mA and/or kV according to patient size and/or use of iterative reconstruction technique. CONTRAST:  OMNIPAQUE IOHEXOL 300 MG/ML  SOLN COMPARISON:  CT abdomen and pelvis dated 04/19/2023 and multiple priors dating back to 02/11/2008 FINDINGS: Lower chest: No focal consolidation or pulmonary nodule in the lung bases. No pleural effusion or pneumothorax demonstrated. Partially imaged heart size is normal. Hepatobiliary: No focal hepatic lesions. No intra or extrahepatic biliary ductal dilation. Similar appearance of the gallbladder fundus dating back to 02/11/2008 may represent adenomyomatosis. Pancreas: No focal lesions or main ductal dilation. Spleen: Normal in size without focal abnormality. Adrenals/Urinary Tract: No adrenal nodules. No suspicious renal mass, calculi or hydronephrosis. Circumferential mural thickening of the decompressed urinary bladder with catheter in-situ. Stomach/Bowel: Normal appearance of the stomach. No evidence of bowel wall thickening, distention, or inflammatory changes. Colonic diverticulosis without acute diverticulitis. Normal appendix. Vascular/Lymphatic: Aortic atherosclerosis. No enlarged abdominal or pelvic lymph nodes. Reproductive: No adnexal masses. Other: No free fluid, fluid collection, or free  air. Musculoskeletal: No acute or abnormal lytic or blastic osseous lesions. Small fat-containing paraumbilical hernia. IMPRESSION: 1. No acute infectious/inflammatory findings in the abdomen or pelvis. 2. Urinary bladder is decompressed with catheter in-situ. 3. Aortic Atherosclerosis (ICD10-I70.0). Electronically Signed   By: Agustin Cree M.D.   On: 05/14/2023 16:11   DG Chest Port 1 View  Result Date: 05/14/2023 CLINICAL DATA:  weakness EXAM: PORTABLE CHEST - 1 VIEW COMPARISON:  05/10/2022 FINDINGS: Lungs are clear. Heart size and mediastinal contours are within normal limits. No effusion. Visualized bones unremarkable. IMPRESSION: No acute cardiopulmonary disease. Electronically Signed   By: Corlis Leak M.D.   On: 05/14/2023 14:03    EKG: Independently reviewed.  NSR with rate 98; nonspecific ST changes with no evidence of acute ischemia   Labs on Admission: I have personally reviewed the available labs and imaging studies at the time of the admission.  Pertinent labs:   wbc: 11.7,  hgb: 16.2,  urine: 20 ketones, 21-50 WBC, rare bacteria,  Assessment and Plan: Principal Problem:   Urinary retention Active Problems:   Uncontrolled type 1 diabetes mellitus with hyperglycemia, with long-term current use of insulin ([name])   Hypothyroidism   Nausea   non obstructive CAD (coronary artery disease)   Orthostatic hypotension   Chronic diastolic HF (heart failure) ([name])   Hyperlipidemia   GERD   Kidney disease, chronic, stage 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)   UTI (urinary tract infection)    Assessment and Plan: * Urinary retention 62 year old female with history of urinary retention that self caths 3x/day presenting to ED with one day history of acute lower abdominal pain, decreased urine output and high blood sugars -obs to tele  -CT abdomen/pelvis with no acute finding -pain relieved after foley placed -? UTI. Continue rocephin until culture results. BC pending. Lactic acid wnl and no SIRS  criteria  -follows with urology with f/u in July  -retention likely secondary to uncontrolled DM.  -she is not keen on leaving a foley catheter.   Uncontrolled type 1 diabetes mellitus with hyperglycemia, with long-term current use of insulin ([name]) -poorly controlled, non compliant. Admits to missing insulin  doses.  -complaints of high sugar, but appears to run high  -A1C in 01/2023: 13.3 -not in DKA on arrival, but likely heading in that direction with possible UTI  -no other sources of infection -lactic acid wnl, no SIRS criteria -continue IVF  -continue long acting insulin. Increase to 8 units. Will hold pre meal for now as she doesn't have much of an appetite.  -SSI and accuchecks qac/hs -repeat A1C pending    Nausea Chronic nausea/vomiting and hx of gastroparesis.  MJ use: check UDS CT abdomen with no acute findings  Continue IVF and anti-emetics.   Hypothyroidism TSH was 24 in February and has not been checked.  Non compliant. She states since May she has been taking daily.  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 Ventriculogram demonstrated normal LV function. 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 Continue midodrine with parameters to hold if SBP >140   Chronic diastolic HF (heart failure) ([name]) -Dry on exam  -On no medical management -IVF x 15 hours -strict I/O -daily weights  ECHO 11/23/2021: EF 55 to 60%, grade 1 diastolic dysfunction. Normal, mitral valve normal, aortic valve tricuspid, normal no pulmonary hypertension   Hyperlipidemia Continue high intensity statin (does not appear  compliant with this)   GERD Continue PPI BID   Kidney disease, chronic, stage 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) Stable, continue to monitor     Advance Care Planning:   Code Status: Full Code   Consults: none   DVT Prophylaxis: lovenox   Family Communication: none   Severity of Illness: The appropriate patient status for this patient is OBSERVATION. Observation status is judged to be reasonable and necessary in order to provide the required intensity of service to ensure the patient's safety. The patient's presenting symptoms, physical exam findings, and initial radiographic and laboratory data in the context of their medical condition is felt to place them at decreased risk for further clinical deterioration. Furthermore, it is anticipated that the patient will be medically stable for discharge from the hospital within 2 midnights of admission.   Author: Orland Mustard, MD 05/14/2023 6:23 PM  For on call review [email or web address].

## 2023-05-14 NOTE — Assessment & Plan Note (Addendum)
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 Continue midodrine with parameters to hold if SBP >140

## 2023-05-14 NOTE — Assessment & Plan Note (Addendum)
Continue high intensity statin (does not appear compliant with this)

## 2023-05-14 NOTE — ED Provider Notes (Signed)
[place] EMERGENCY DEPARTMENT AT [facility] Provider Note   CSN: [id number] Arrival date & time: 05/14/23  1304     History  Chief Complaint  Patient presents with   Hyperglycemia   Dysuria    [name] [name] is a 62 y.o. female.  62 year old female with medical history as detailed below presents for evaluation.  Patient with history of poorly controlled diabetes, gastroparesis, DKA, urinary retention.  Patient reports increased low abdominal pain and elevated sugars at home over the last 24 to 48 hours.  Patient reports that she has been self cathing.  She reports that she does this 3 times a day.  Her last attempted self cathing was this morning and she had minimal output.  She denies fever.  She reports increasing nausea associated with her suprapubic abdominal pain.  Sugars have been into the 300s at home.  She reports compliance with insulin as previously prescribed.  The history is provided by the patient and medical records.       Home Medications Prior to Admission medications   Medication Sig Start Date End Date Taking? Authorizing Provider  aspirin EC 81 MG tablet Take 81 mg by mouth daily. Swallow whole.    [provider]  atorvastatin (LIPITOR) 80 MG tablet Take 1 tablet (80 mg total) by mouth daily. Patient not taking: Reported on 04/19/2023 01/12/22   Excell Seltzer, MD  Continuous Blood Gluc Sensor (DEXCOM G6 SENSOR) MISC INJECT 1 SENSOR INTO SKIN EVERY 10 DAYS Patient taking differently: Inject 1 Device into the skin See admin instructions. Place 1 new device into the skin every 10 days 07/18/22   Excell Seltzer, MD  Continuous Blood Gluc Transmit (DEXCOM G6 TRANSMITTER) MISC USE AS DIRECTED FOR CONTINUOUS GLUCOSE MONITORING. REUSE TRANSMITTER X 90DAYS THEN DISCARD & REPLACE 07/05/21   Bedsole, Amy E, MD  eletriptan (RELPAX) 40 MG tablet Take 1 tablet (40 mg total) by mouth as needed for migraine or headache. May repeat in 2 hours if headache  persists or recurs. Patient not taking: Reported on 04/19/2023 08/14/22   Excell Seltzer, MD  eszopiclone (LUNESTA) 1 MG TABS tablet Take 1 mg by mouth at bedtime. Take immediately before bedtime    [provider]  insulin lispro (HUMALOG KWIKPEN) 100 UNIT/ML KwikPen INJECT INTO SKIN 3 TIMES A DAY AT MEAL TIME ACCORDING TO THE FOLLOWING SLIDING SCALE: 27 UNITS MAX PER DAY DX E11.43 Patient taking differently: Inject 4 Units into the skin See admin instructions. Inject 4 units into the skin three times a day before meals, per sliding scale 02/26/22   Bedsole, Amy E, MD  Insulin Pen Needle (PEN NEEDLES) 31G X 8 MM MISC Use to inject insulin 4 times a day.  Dx: E11.10 07/21/21   Excell Seltzer, MD  Lancets (ONETOUCH ULTRASOFT) lancets Use to check blood sugar daily as needed.  Dx: E11.10 12/23/17   Excell Seltzer, MD  levothyroxine (SYNTHROID) 137 MCG tablet Take 137 mcg by mouth daily before breakfast. 03/19/22   [provider]  metoCLOPramide (REGLAN) 5 MG tablet Take 1 tablet (5 mg total) by mouth 3 (three) times daily before meals. Patient taking differently: Take 5 mg by mouth See admin instructions. Take 5 mg by mouth three times a day before meals as needed for heartburn/G.I. symptoms 02/06/23   Albertine Grates, MD  midodrine (PROAMATINE) 5 MG tablet Take 1 tablet (5 mg total) by mouth 2 (two) times daily with a meal. Hold if  your systolic blood pressure  ( top number) is > , please check your blood pressure at home once a day and bring in record for your doctor to review, follow up with your pcp and cardiology regarding your blood pressure medication Patient taking differently: Take 5 mg by mouth See admin instructions. Take 5 mg by mouth two times a day with meals and hold if the systolic blood pressure (top number) is > 140 mmhg. Additionally, "please check your blood pressure at home once a day and bring in record for your doctor to review. Follow up with your pcp and cardiology  regarding your blood pressure medication." 02/06/23   Albertine Grates, MD  pantoprazole (PROTONIX) 40 MG tablet Take 1 tablet (40 mg total) by mouth 2 (two) times daily. 02/06/23 04/19/23  Albertine Grates, MD  promethazine (PHENERGAN) 25 MG tablet Take 1 tablet (25 mg total) by mouth every 8 (eight) hours as needed for nausea or vomiting. 01/05/22   Bedsole, Amy E, MD  TOUJEO SOLOSTAR 300 UNIT/ML Solostar Pen Inject 6 Units into the skin at bedtime. Patient taking differently: Inject 8 Units into the skin at bedtime. 03/29/22   Regalado, Prentiss Bells, MD      Allergies    Codeine    Review of Systems   Review of Systems  All other systems reviewed and are negative.   Physical Exam Updated Vital Signs BP (!) 136/114 (BP Location: Right Arm)   Pulse 97   Temp 98.2 F (36.8 C) (Oral)   Resp 18   Ht 4\' 11"  (1.499 m)   Wt 55.3 kg   LMP 08/11/2012   SpO2 100%   BMI 24.64 kg/m  Physical Exam Vitals and nursing note reviewed.  Constitutional:      General: She is not in acute distress.    Appearance: Normal appearance. She is well-developed.  HENT:     Head: Normocephalic and atraumatic.  Eyes:     Conjunctiva/sclera: Conjunctivae normal.     Pupils: Pupils are equal, round, and reactive to light.  Cardiovascular:     Rate and Rhythm: Normal rate and regular rhythm.     Heart sounds: Normal heart sounds.  Pulmonary:     Effort: Pulmonary effort is normal. No respiratory distress.     Breath sounds: Normal breath sounds.  Abdominal:     General: There is no distension.     Palpations: Abdomen is soft.     Tenderness: There is abdominal tenderness.     Comments: Tenderness overlying the bladder in the suprapubic region.  Musculoskeletal:        General: No deformity. Normal range of motion.     Cervical back: Normal range of motion and neck supple.  Skin:    General: Skin is warm and dry.  Neurological:     General: No focal deficit present.     Mental Status: She is alert and oriented to  person, place, and time.     ED Results / Procedures / Treatments   Labs (all labs ordered are listed, but only abnormal results are displayed) Labs Reviewed  CBC WITH DIFFERENTIAL/PLATELET - Abnormal; Notable for the following components:      Result Value   WBC 11.7 (*)    RBC 5.70 (*)    Hemoglobin 16.2 (*)    HCT 48.6 (*)    Neutro Abs 8.9 (*)    All other components within normal limits  COMPREHENSIVE METABOLIC PANEL - Abnormal; Notable for the following components:  Chloride 95 (*)    Glucose, Bld 351 (*)    All other components within normal limits  URINALYSIS, W/ REFLEX TO CULTURE (INFECTION SUSPECTED) - Abnormal; Notable for the following components:   Color, Urine YELLOW (*)    APPearance CLOUDY (*)    Glucose, UA >=500 (*)    Ketones, ur 20 (*)    Protein, ur 30 (*)    Leukocytes,Ua TRACE (*)    Bacteria, UA RARE (*)    All other components within normal limits  LIPASE, BLOOD - Abnormal; Notable for the following components:   Lipase 68 (*)    All other components within normal limits  CBG MONITORING, ED - Abnormal; Notable for the following components:   Glucose-Capillary 379 (*)    All other components within normal limits  CBG MONITORING, ED - Abnormal; Notable for the following components:   Glucose-Capillary 276 (*)    All other components within normal limits  CULTURE, BLOOD (ROUTINE X 2)  CULTURE, BLOOD (ROUTINE X 2)  URINE CULTURE  LACTIC ACID, PLASMA  LACTIC ACID, PLASMA    EKG EKG Interpretation  Date/Time:  Tuesday May 14 2023 13:24:16 EDT Ventricular Rate:  98 PR Interval:  164 QRS Duration: 79 QT Interval:  349 QTC Calculation: 446 R Axis:   95 Text Interpretation: Sinus rhythm Probable left atrial enlargement Borderline right axis deviation Confirmed by Kristine Royal [phone]) on 05/14/2023 1:28:49 PM  Radiology DG Chest Port 1 View  Result Date: 05/14/2023 CLINICAL DATA:  weakness EXAM: PORTABLE CHEST - 1 VIEW COMPARISON:  05/10/2022  FINDINGS: Lungs are clear. Heart size and mediastinal contours are within normal limits. No effusion. Visualized bones unremarkable. IMPRESSION: No acute cardiopulmonary disease. Electronically Signed   By: Corlis Leak M.D.   On: 05/14/2023 14:03    Procedures Procedures    Medications Ordered in ED Medications  cefTRIAXone (ROCEPHIN) 1 g in sodium chloride 0.9 % 100 mL IVPB (1 g Intravenous New Bag/Given 05/14/23 1601)  sodium chloride 0.9 % bolus 1,000 mL (0 mLs Intravenous Stopped 05/14/23 1554)  ondansetron (ZOFRAN) injection 4 mg (4 mg Intravenous Given 05/14/23 1337)  metoCLOPramide (REGLAN) injection 10 mg (10 mg Intravenous Given 05/14/23 1337)  insulin aspart (novoLOG) injection 6 Units (6 Units Subcutaneous Given 05/14/23 1558)  iohexol (OMNIPAQUE) 300 MG/ML solution 100 mL (100 mLs Intravenous Contrast Given 05/14/23 1520)    ED Course/ Medical Decision Making/ A&P                             Medical Decision Making Amount and/or Complexity of Data Reviewed Labs: ordered. Radiology: ordered.  Risk Prescription drug management.    Medical Screen Complete  This patient presented to the ED with complaint of hyperglycemia, suprapubic abdominal pain, urinary retention.  This complaint involves an extensive number of treatment options. The initial differential diagnosis includes, but is not limited to, infection, metabolic abnormality, UTI, etc.  This presentation is: Acute, Chronic, Self-Limited, Previously Undiagnosed, Uncertain Prognosis, Complicated, Systemic Symptoms, and Threat to Life/Bodily Function  Patient with history of poorly controlled diabetes, gastroparesis, urinary retention presents with increasing abdominal pain, hypoglycemia.  Presentation - especially with history of self-cathing for urine - is concerning for UTI.  Screening labs obtained are consistent with same.  Patient with white count of 11.7, UA is suggestive of UTI, initial glucose is elevated at  350.  Anion gap is 15, lactic acid is 1.4.  IV fluids, antibiotics initiated  in ED.  Patient will benefit from admission for treatment.  Hospitalist service aware of case and will evaluate for same.   Additional history obtained:  External records from outside sources obtained and reviewed including prior ED visits and prior Inpatient records.    Lab Tests:  I ordered and personally interpreted labs.    Imaging Studies ordered:  I ordered imaging studies including CT AP, CXR  I independently visualized and interpreted obtained imaging which showed likely cystitis I agree with the radiologist interpretation.   Cardiac Monitoring:  The patient was maintained on a cardiac monitor.  I personally viewed and interpreted the cardiac monitor which showed an underlying rhythm of: NSR   Medicines ordered:  I ordered medication including IV fluids, insulin, antibiotics, Reglan, Zofran for suspected infection, hyperglycemia, nausea Reevaluation of the patient after these medicines showed that the patient: improved    Problem List / ED Course:  UTI, hyperglycemia, nausea   Reevaluation:  After the interventions noted above, I reevaluated the patient and found that they have: improved  Disposition:  After consideration of the diagnostic results and the patients response to treatment, I feel that the patent would benefit from admission.          Final Clinical Impression(s) / ED Diagnoses Final diagnoses:  Hyperglycemia  Urinary retention  Urinary tract infection without hematuria, site unspecified    Rx / DC Orders ED Discharge Orders     None         Wynetta Fines, MD 05/14/23 1609

## 2023-05-14 NOTE — ED Triage Notes (Signed)
Pt BIBA from home for hyperglycemia, abdominal pain, and urinary retention/dysuria. CBG at home in 300s, usually in 200s. Has f/u with endocrinologist in July. 10 units novolog lunchtime. Self caths TID, last this am with no output. Urgency since yesterday.  CBG 380 HR 100

## 2023-05-14 NOTE — ED Notes (Signed)
ED TO INPATIENT HANDOFF REPORT  ED Nurse Name and Phone #: Linus Orn Name/Age/Gender [name] [name] 62 y.o. female Room/Bed: WA12/WA12  Code Status   Code Status: Full Code  Home/SNF/Other Home Patient oriented to: self, place, time, and situation Is this baseline? Yes   Triage Complete: Triage complete  Chief Complaint Urinary retention [R33.9]  Triage Note Pt BIBA from home for hyperglycemia, abdominal pain, and urinary retention/dysuria. CBG at home in 300s, usually in 200s. Has f/u with endocrinologist in July. 10 units novolog lunchtime. Self caths TID, last this am with no output. Urgency since yesterday.  CBG 380 HR 100   Allergies Allergies  Allergen Reactions   Codeine Hives, Anxiety and Other (See Comments)    Level of Care/Admitting Diagnosis ED Disposition     ED Disposition  Admit   Condition  --   Comment  Hospital Area: [facility] [[id number]]  Level of Care: Telemetry [5]  Admit to tele based on following criteria: Complex arrhythmia (Bradycardia/Tachycardia)  May place patient in observation at [facility] or [facility] if equivalent level of care is available:: Yes  Covid Evaluation: Asymptomatic - no recent exposure (last 10 days) testing not required  Diagnosis: Urinary retention [161096]  Admitting Physician: Orland Mustard [[id number]]  Attending Physician: Orland Mustard [[id number]]          B Medical/Surgery History Past Medical History:  Diagnosis Date   Allergy    seasonal   Anxiety    B12 deficiency    Chest pain 04/05/2017   CHF (congestive heart failure) ([name])    Diabetes mellitus type 1, uncontrolled    "dr. 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) 10/29/2013   Leg cramping    "@ night" (09/18/2013)   Migraine    "once q couple months" (11/26/2018)   Osteoporosis    Peripheral neuropathy    Umbilical hernia    unrepaired (09/18/2013)   Past Surgical History:  Procedure Laterality Date   BIOPSY  06/03/2019   Procedure: BIOPSY;  Surgeon: Lynann Bologna, MD;  Location: [facility];  Service: Endoscopy;;   BIOPSY  08/17/2020   Procedure: BIOPSY;  Surgeon: Tressia Danas, MD;  Location: [facility];  Service: Gastroenterology;;   BIOPSY  02/05/2023   Procedure: BIOPSY;  Surgeon: Meryl Dare, MD;  Location: [facility];  Service: Gastroenterology;;   CESAREAN SECTION  1989   ENTEROSCOPY N/A 06/03/2019   Procedure: ENTEROSCOPY;  Surgeon: Lynann Bologna, MD;  Location: [facility];  Service: Endoscopy;  Laterality: N/A;   ESOPHAGOGASTRODUODENOSCOPY (EGD) WITH PROPOFOL N/A 08/17/2020   Procedure: ESOPHAGOGASTRODUODENOSCOPY (EGD) WITH PROPOFOL;  Surgeon: Tressia Danas, MD;  Location: [facility];  Service: Gastroenterology;  Laterality: N/A;   ESOPHAGOGASTRODUODENOSCOPY (EGD) WITH PROPOFOL N/A 02/05/2023   Procedure: ESOPHAGOGASTRODUODENOSCOPY (EGD) WITH PROPOFOL;  Surgeon: Meryl Dare, MD;  Location: [facility];  Service: Gastroenterology;  Laterality: N/A;   LAPAROSCOPIC ASSISTED VAGINAL HYSTERECTOMY  09/23/2012   Procedure: LAPAROSCOPIC ASSISTED VAGINAL HYSTERECTOMY;  Surgeon: Mitchel Honour, DO;  Location: [facility];  Service: Gynecology;  Laterality: N/A;  pull Dr Vance Gather instrument   LEFT HEART CATH AND CORONARY ANGIOGRAPHY N/A 02/11/2017   Procedure: Left Heart Cath and Coronary Angiography;  Surgeon: Runell Gess, MD;  Location: [facility] INVASIVE CV [facility];  Service: Cardiovascular;  Laterality: N/A;   TUBAL LIGATION Bilateral 1992     A IV Location/Drains/Wounds Patient Lines/Drains/Airways Status     Active Line/Drains/Airways     Name Placement date Placement time Site Days   Peripheral IV 05/14/23 20 G 1" Left  Antecubital 05/14/23  1332  Antecubital  less than 1   Urethral Catheter Chalmers Guest NT Latex;Straight-tip 14 Fr. 05/14/23  1350  Latex;Straight-tip  less than 1            Intake/Output Last 24 hours No intake or output data in the 24 hours ending 05/14/23 1706  Labs/Imaging Results for orders placed or performed during the hospital encounter of 05/14/23 (from the past 48 hour(s))  CBG monitoring, ED     Status: Abnormal   Collection Time: 05/14/23  1:10 PM  Result Value Ref Range   Glucose-Capillary 379 (H) 70 - 99 mg/dL    Comment: Glucose reference range applies only to samples taken after fasting for at least 8 hours.  CBC with Differential     Status: Abnormal   Collection Time: 05/14/23  1:30 PM  Result Value Ref Range   WBC 11.7 (H) 4.0 - 10.5 K/uL   RBC 5.70 (H) 3.87 - 5.11 MIL/uL   Hemoglobin 16.2 (H) 12.0 - 15.0 g/dL   HCT 16.1 (H) 09.6 - 04.5 %   MCV 85.3 80.0 - 100.0 fL   MCH 28.4 26.0 - 34.0 pg   MCHC 33.3 30.0 - 36.0 g/dL   RDW 40.9 81.1 - 91.4 %   Platelets 382 150 - 400 K/uL   nRBC 0.0 0.0 - 0.2 %   Neutrophils Relative % 75 %   Neutro Abs 8.9 (H) 1.7 - 7.7 K/uL   Lymphocytes Relative 18 %   Lymphs Abs 2.2 0.7 - 4.0 K/uL   Monocytes Relative 4 %   Monocytes Absolute 0.5 0.1 - 1.0 K/uL   Eosinophils Relative 1 %   Eosinophils Absolute 0.1 0.0 - 0.5 K/uL   Basophils Relative 1 %   Basophils Absolute 0.1 0.0 - 0.1 K/uL   Immature Granulocytes 1 %   Abs Immature Granulocytes 0.06 0.00 - 0.07 K/uL    Comment: Performed at [facility], [address] [place], [place] [address]  Comprehensive metabolic panel     Status: Abnormal   Collection Time: 05/14/23  1:30 PM  Result Value Ref Range   Sodium 136 135 - 145 mmol/L   Potassium 3.7 3.5 - 5.1 mmol/L   Chloride 95 (L) 98 - 111 mmol/L   CO2 26 22 - 32 mmol/L   Glucose, Bld 351 (H) 70 - 99 mg/dL    Comment: Glucose reference range applies only to samples taken after fasting for at  least 8 hours.   BUN 14 6 - 20 mg/dL   Creatinine, Ser 6.21 0.44 - 1.00 mg/dL   Calcium 9.1 8.9 - 30.8 mg/dL   Total Protein 8.0 6.5 - 8.1 g/dL   Albumin 4.1 3.5 - 5.0 g/dL   AST 15 15 - 41 U/L   ALT 12 0 - 44 U/L   Alkaline Phosphatase 72 38 - 126 U/L   Total Bilirubin 0.7 0.3 - 1.2 mg/dL   GFR, Estimated >65 >78 mL/min    Comment: (NOTE) Calculated using the CKD-EPI Creatinine Equation (2021)    Anion gap 15 5 - 15    Comment: Performed at [facility], [address] [place], [place] [address]  Lactic acid, plasma     Status: None   Collection Time: 05/14/23  1:30 PM  Result Value Ref Range   Lactic Acid, Venous 1.4 0.5 - 1.9 mmol/L    Comment: Performed at [facility], [address], [place], [place] [address]  Lipase, blood     Status: Abnormal   Collection Time: 05/14/23  1:30 PM  Result Value Ref Range   Lipase 68 (H) 11 - 51 U/L    Comment: Performed at [facility], [address], [place], [place] [address]  Urinalysis, w/ Reflex to Culture (Infection Suspected) -Urine, Clean Catch     Status: Abnormal   Collection Time: 05/14/23  1:54 PM  Result Value Ref Range   Specimen Source URINE, CLEAN CATCH    Color, Urine YELLOW (A) YELLOW   APPearance CLOUDY (A) CLEAR   Specific Gravity, Urine 1.026 1.005 - 1.030   pH 5.0 5.0 - 8.0   Glucose, UA >=500 (A) NEGATIVE mg/dL   Hgb urine dipstick NEGATIVE NEGATIVE   Bilirubin Urine NEGATIVE NEGATIVE   Ketones, ur 20 (A) NEGATIVE mg/dL   Protein, ur 30 (A) NEGATIVE mg/dL   Nitrite NEGATIVE NEGATIVE   Leukocytes,Ua TRACE (A) NEGATIVE   RBC / HPF 0-5 0 - 5 RBC/hpf   WBC, UA 21-50 0 - 5 WBC/hpf    Comment:        Reflex urine culture not performed if WBC <=10, OR if Squamous epithelial cells >5. If Squamous epithelial cells >5 suggest recollection.    Bacteria, UA RARE (A) NONE SEEN   Squamous Epithelial / HPF 0-5 0 - 5 /HPF   Mucus PRESENT     Comment: Performed  at [facility], [address] [place], [place] [address]  CBG monitoring, ED     Status: Abnormal   Collection Time: 05/14/23  3:57 PM  Result Value Ref Range   Glucose-Capillary 276 (H) 70 - 99 mg/dL    Comment: Glucose reference range applies only to samples taken after fasting for at least 8 hours.   CT ABDOMEN PELVIS W CONTRAST  Result Date: 05/14/2023 CLINICAL DATA:  Hyperglycemia, abdominal pain, and urinary retention EXAM: CT ABDOMEN AND PELVIS WITH CONTRAST TECHNIQUE: Multidetector CT imaging of the abdomen and pelvis was performed using the standard protocol following bolus administration of intravenous contrast. RADIATION DOSE REDUCTION: This exam was performed according to the departmental dose-optimization program which includes automated exposure control, adjustment of the mA and/or kV according to patient size and/or use of iterative reconstruction technique. CONTRAST:  OMNIPAQUE IOHEXOL 300 MG/ML  SOLN COMPARISON:  CT abdomen and pelvis dated 04/19/2023 and multiple priors dating back to 02/11/2008 FINDINGS: Lower chest: No focal consolidation or pulmonary nodule in the lung bases. No pleural effusion or pneumothorax demonstrated. Partially imaged heart size is normal. Hepatobiliary: No focal hepatic lesions. No intra or extrahepatic biliary ductal dilation. Similar appearance of the gallbladder fundus dating back to 02/11/2008 may represent adenomyomatosis. Pancreas: No focal lesions or main ductal dilation. Spleen: Normal in size without focal abnormality. Adrenals/Urinary Tract: No adrenal nodules. No suspicious renal mass, calculi or hydronephrosis. Circumferential mural thickening of the decompressed urinary bladder with catheter in-situ. Stomach/Bowel: Normal appearance of the stomach. No evidence of bowel wall thickening, distention, or inflammatory changes. Colonic diverticulosis without acute diverticulitis. Normal appendix. Vascular/Lymphatic: Aortic  atherosclerosis. No enlarged abdominal or pelvic lymph nodes. Reproductive: No adnexal masses.  Other: No free fluid, fluid collection, or free air. Musculoskeletal: No acute or abnormal lytic or blastic osseous lesions. Small fat-containing paraumbilical hernia. IMPRESSION: 1. No acute infectious/inflammatory findings in the abdomen or pelvis. 2. Urinary bladder is decompressed with catheter in-situ. 3. Aortic Atherosclerosis (ICD10-I70.0). Electronically Signed   By: Agustin Cree M.D.   On: 05/14/2023 16:11   DG Chest Port 1 View  Result Date: 05/14/2023 CLINICAL DATA:  weakness EXAM: PORTABLE CHEST - 1 VIEW COMPARISON:  05/10/2022 FINDINGS: Lungs are clear. Heart size and mediastinal contours are within normal limits. No effusion. Visualized bones unremarkable. IMPRESSION: No acute cardiopulmonary disease. Electronically Signed   By: Corlis Leak M.D.   On: 05/14/2023 14:03    Pending Labs Unresulted Labs (From admission, onward)     Start     Ordered   05/15/23 0500  Basic metabolic panel  Tomorrow morning,   R        05/14/23 1659   05/15/23 0500  CBC  Tomorrow morning,   R        05/14/23 1659   05/14/23 1632  Hemoglobin A1c  Once,   R       Comments: To assess prior glycemic control    05/14/23 1631   05/14/23 1629  TSH  Once,   URGENT        05/14/23 1628   05/14/23 1629  T4, free  Once,   URGENT        05/14/23 1628   05/14/23 1623  Rapid urine drug screen (hospital performed)  ONCE - STAT,   STAT        05/14/23 1622   05/14/23 1354  Urine Culture  Once,   R        05/14/23 1354   05/14/23 1316  Culture, blood (routine x 2)  BLOOD CULTURE X 2,   R (with STAT occurrences)      05/14/23 1315   05/14/23 1316  Lactic acid, plasma  Now then every 2 hours,   R (with STAT occurrences)      05/14/23 1315            Vitals/Pain Today's Vitals   05/14/23 1415 05/14/23 1430 05/14/23 1445 05/14/23 1500  BP: 129/85 128/68 (!) 137/99 (!) 142/74  Pulse: (!) 102 90 99 94  Resp: 19 19 (!)  21 17  Temp:      TempSrc:      SpO2: 98% 98% 100% 98%  Weight:      Height:      PainSc:        Isolation Precautions No active isolations  Medications Medications  insulin aspart (novoLOG) injection 0-15 Units (has no administration in time range)  enoxaparin (LOVENOX) injection 40 mg (has no administration in time range)  0.9 %  sodium chloride infusion (has no administration in time range)  acetaminophen (TYLENOL) tablet 650 mg (has no administration in time range)    Or  acetaminophen (TYLENOL) suppository 650 mg (has no administration in time range)  ondansetron (ZOFRAN) tablet 4 mg (has no administration in time range)    Or  ondansetron (ZOFRAN) injection 4 mg (has no administration in time range)  sodium chloride 0.9 % bolus 1,000 mL (0 mLs Intravenous Stopped 05/14/23 1554)  ondansetron (ZOFRAN) injection 4 mg (4 mg Intravenous Given 05/14/23 1337)  metoCLOPramide (REGLAN) injection 10 mg (10 mg Intravenous Given 05/14/23 1337)  insulin aspart (novoLOG) injection 6 Units (6 Units Subcutaneous Given 05/14/23 1558)  iohexol (OMNIPAQUE)  300 MG/ML solution 100 mL (100 mLs Intravenous Contrast Given 05/14/23 1520)  cefTRIAXone (ROCEPHIN) 1 g in sodium chloride 0.9 % 100 mL IVPB (0 g Intravenous Stopped 05/14/23 1704)    Mobility walks     Focused Assessments UTI/Elevated cbg   R Recommendations: See Admitting Provider Note  Report given to:   Additional Notes: aaox4, ambulates, foley placed.

## 2023-05-14 NOTE — Assessment & Plan Note (Signed)
62 year old female with history of urinary retention that self caths 3x/day presenting to ED with one day history of acute lower abdominal pain, decreased urine output and high blood sugars -obs to tele  -CT abdomen/pelvis with no acute finding -pain relieved after foley placed -? UTI. Continue rocephin until culture results. BC pending. Lactic acid wnl and no SIRS criteria  -follows with urology with f/u in July  -retention likely secondary to uncontrolled DM.  -she is not keen on leaving a foley catheter.

## 2023-05-14 NOTE — Assessment & Plan Note (Signed)
Stable, continue to monitor  ?

## 2023-05-15 DIAGNOSIS — R339 Retention of urine, unspecified: Secondary | ICD-10-CM | POA: Diagnosis not present

## 2023-05-15 LAB — CBC
HCT: 44.3 % (ref 36.0–46.0)
Hemoglobin: 13.5 g/dL (ref 12.0–15.0)
MCH: 28.7 pg (ref 26.0–34.0)
MCHC: 30.5 g/dL (ref 30.0–36.0)
MCV: 94.1 fL (ref 80.0–100.0)
Platelets: 311 10*3/uL (ref 150–400)
RBC: 4.71 MIL/uL (ref 3.87–5.11)
RDW: 14.9 % (ref 11.5–15.5)
WBC: 10.3 10*3/uL (ref 4.0–10.5)
nRBC: 0 % (ref 0.0–0.2)

## 2023-05-15 LAB — BASIC METABOLIC PANEL
Anion gap: 8 (ref 5–15)
BUN: 11 mg/dL (ref 6–20)
CO2: 20 mmol/L — ABNORMAL LOW (ref 22–32)
Calcium: 7.8 mg/dL — ABNORMAL LOW (ref 8.9–10.3)
Chloride: 106 mmol/L (ref 98–111)
Creatinine, Ser: 0.56 mg/dL (ref 0.44–1.00)
GFR, Estimated: >60 mL/min (ref >60)
Glucose, Bld: 177 mg/dL — ABNORMAL HIGH (ref 70–99)
Potassium: 4 mmol/L (ref 3.5–5.1)
Sodium: 134 mmol/L — ABNORMAL LOW (ref 135–145)

## 2023-05-15 LAB — T4, FREE: Free T4: 0.71 ng/dL (ref 0.61–1.12)

## 2023-05-15 LAB — GLUCOSE, CAPILLARY
Glucose-Capillary: 200 mg/dL — ABNORMAL HIGH (ref 70–99)
Glucose-Capillary: 273 mg/dL — ABNORMAL HIGH (ref 70–99)
Glucose-Capillary: 147 mg/dL — ABNORMAL HIGH (ref 70–99)
Glucose-Capillary: 247 mg/dL — ABNORMAL HIGH (ref 70–99)

## 2023-05-15 LAB — CULTURE, BLOOD (ROUTINE X 2): Culture: NO GROWTH

## 2023-05-15 MED ORDER — SODIUM CHLORIDE 0.9 % IV SOLN: INTRAVENOUS | Status: AC

## 2023-05-15 MED ORDER — CHLORHEXIDINE GLUCONATE CLOTH 2 % EX PADS
6.0000 | MEDICATED_PAD | Freq: Every day | CUTANEOUS | Status: DC
  Administered 2023-05-15 – 2023-05-16 (×2): 6 via TOPICAL

## 2023-05-15 NOTE — Progress Notes (Signed)
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  Right shoulder pain cervical radiculopathy  Present [facility] 6/4+ hyperglycemia + abdominal pain + dysuria CBG 300 WBC 11 glucose 350 anion gap 15 lactic acid 1.4 Rx IVF 1 L insulin Reglan Zofran Rocephin Foley placed   Hospital-Problem based course  ?  Cystitis-does self cath-declines Foley Await urine culture, blood culture from 05/14/2023 Follows with [facility]-messaged Dr. Laverle Patter to ensure closer follow-up is set up  Hyperglycemia on admission, uncontrolled diabetes mellitus type 1 Patient can recite back what she normally takes pretty well--she follows with [facility] Bradly Chris, NP --she will need to follow-up closely with them CBGs currently below 220, continue Semglee 10 units, moderate sliding scale, A1c pending  Hypothyroidism noncompliant with thyroxine Resumed Synthroid 137 mcg, TSH 53 when checked Compliance needs to be reinforced  Paroxysmal A-fib HFrEF--HFmEF-cath 2024 normal ventriculogram with incidental fistula which is not acute Is on aspirin 81 only-- Needs to continue Ranexa for microvascular ischemia--is [facility] 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  DVT prophylaxis: SCD Code Status: Presumed full Family  Communication: None present Disposition:  Status is: Observation The patient remains OBS appropriate and will d/c before 2 midnights.    Subjective: Awake coherent asking if she can go home No chest pain no fever Asking if the catheter will be coming out at time of discharge No nausea no vomiting  Objective: Vitals:   05/14/23 1810 05/14/23 2133 05/15/23 0221 05/15/23 0438  BP: (!) 89/74 (!) 94/58 99/70 121/80  Pulse: 91 83 78 79  Resp: 18 18 18 18   Temp: 98.3 F (36.8 C) 98.2 F (36.8 C) 98 F (36.7 C) 98 F (36.7 C)  TempSrc: Oral Oral Oral Oral  SpO2: 100% 100% 98% 100%  Weight: 53.6 kg   55.4 kg  Height: 4\' 11"  (1.499 m)       Intake/Output Summary (Last 24 hours) at 05/15/2023 0740 Last data filed at 05/15/2023 0504 Gross per 24 hour  Intake 2382.79 ml  Output 1500 ml  Net 882.79 ml   Filed Weights   05/14/23 1353 05/14/23 1810 05/15/23 0438  Weight: 55.3 kg 53.6 kg 55.4 kg    Examination:  EOMI NCAT no focal deficit no icterus no pallor Affect is quite flat Chest clear no wheeze rales or rhonchi S1-S2 no murmur Abdomen is soft no rebound no guarding ROM is intact power is 5/5 major muscle groups   Data Reviewed: personally reviewed   CBC    Component Value Date/Time   WBC 10.3 05/15/2023 0421   RBC 4.71 05/15/2023 0421   HGB 13.5 05/15/2023 0421   HGB 15.7 07/11/2020 1231   HCT 44.3 05/15/2023 0421   HCT 48.3 (H) 07/11/2020 1231   PLT 311 05/15/2023 0421   PLT 318 07/11/2020 1231   MCV 94.1 05/15/2023 0421   MCV 89 07/11/2020 1231   MCH 28.7 05/15/2023 0421  MCHC 30.5 05/15/2023 0421   RDW 14.9 05/15/2023 0421   RDW 13.7 07/11/2020 1231   LYMPHSABS 2.2 05/14/2023 1330   MONOABS 0.5 05/14/2023 1330   EOSABS 0.1 05/14/2023 1330   BASOSABS 0.1 05/14/2023 1330      Latest Ref Rng & Units 05/15/2023    4:21 AM 05/14/2023    1:30 PM 04/21/2023    5:04 AM  CMP  Glucose 70 - 99 mg/dL 789  381  017   BUN 6 - 20 mg/dL 11  14  13    Creatinine 0.44  - 1.00 mg/dL 5.10  2.58  5.27   Sodium 135 - 145 mmol/L 134  136  134   Potassium 3.5 - 5.1 mmol/L 4.0  3.7  4.1   Chloride 98 - 111 mmol/L 106  95  101   CO2 22 - 32 mmol/L 20  26  22    Calcium 8.9 - 10.3 mg/dL 7.8  9.1  8.7   Total Protein 6.5 - 8.1 g/dL  8.0    Total Bilirubin 0.3 - 1.2 mg/dL  0.7    Alkaline Phos 38 - 126 U/L  72    AST 15 - 41 U/L  15    ALT 0 - 44 U/L  12       Radiology Studies: CT ABDOMEN PELVIS W CONTRAST  Result Date: 05/14/2023 CLINICAL DATA:  Hyperglycemia, abdominal pain, and urinary retention EXAM: CT ABDOMEN AND PELVIS WITH CONTRAST TECHNIQUE: Multidetector CT imaging of the abdomen and pelvis was performed using the standard protocol following bolus administration of intravenous contrast. RADIATION DOSE REDUCTION: This exam was performed according to the departmental dose-optimization program which includes automated exposure control, adjustment of the mA and/or kV according to patient size and/or use of iterative reconstruction technique. CONTRAST:  OMNIPAQUE IOHEXOL 300 MG/ML  SOLN COMPARISON:  CT abdomen and pelvis dated 04/19/2023 and multiple priors dating back to 02/11/2008 FINDINGS: Lower chest: No focal consolidation or pulmonary nodule in the lung bases. No pleural effusion or pneumothorax demonstrated. Partially imaged heart size is normal. Hepatobiliary: No focal hepatic lesions. No intra or extrahepatic biliary ductal dilation. Similar appearance of the gallbladder fundus dating back to 02/11/2008 may represent adenomyomatosis. Pancreas: No focal lesions or main ductal dilation. Spleen: Normal in size without focal abnormality. Adrenals/Urinary Tract: No adrenal nodules. No suspicious renal mass, calculi or hydronephrosis. Circumferential mural thickening of the decompressed urinary bladder with catheter in-situ. Stomach/Bowel: Normal appearance of the stomach. No evidence of bowel wall thickening, distention, or inflammatory changes. Colonic  diverticulosis without acute diverticulitis. Normal appendix. Vascular/Lymphatic: Aortic atherosclerosis. No enlarged abdominal or pelvic lymph nodes. Reproductive: No adnexal masses. Other: No free fluid, fluid collection, or free air. Musculoskeletal: No acute or abnormal lytic or blastic osseous lesions. Small fat-containing paraumbilical hernia. IMPRESSION: 1. No acute infectious/inflammatory findings in the abdomen or pelvis. 2. Urinary bladder is decompressed with catheter in-situ. 3. Aortic Atherosclerosis (ICD10-I70.0). Electronically Signed   By: Agustin Cree M.D.   On: 05/14/2023 16:11   DG Chest Port 1 View  Result Date: 05/14/2023 CLINICAL DATA:  weakness EXAM: PORTABLE CHEST - 1 VIEW COMPARISON:  05/10/2022 FINDINGS: Lungs are clear. Heart size and mediastinal contours are within normal limits. No effusion. Visualized bones unremarkable. IMPRESSION: No acute cardiopulmonary disease. Electronically Signed   By: Corlis Leak M.D.   On: 05/14/2023 14:03     Scheduled Meds:  aspirin EC  81 mg Oral Daily   atorvastatin  80 mg Oral Daily  Chlorhexidine Gluconate Cloth  6 each Topical Daily   enoxaparin (LOVENOX) injection  40 mg Subcutaneous Q24H   insulin aspart  0-15 Units Subcutaneous TID WC   insulin glargine-yfgn  10 Units Subcutaneous QHS   levothyroxine  137 mcg Oral QAC breakfast   metoCLOPramide  5 mg Oral BID AC   midodrine  5 mg Oral BID WC   pantoprazole  40 mg Oral BID   Continuous Infusions:  sodium chloride 100 mL/hr at 05/14/23 1812   cefTRIAXone (ROCEPHIN)  IV       LOS: 0 days   Time spent: 37  Rhetta Mura, MD [facility] To contact the attending provider between 7A-7P or the covering provider during after hours 7P-7A, please log into the web site [email or web address] and access using universal [place] password for that web site. If you do not have the password, please call the hospital operator.  05/15/2023, 7:40 AM

## 2023-05-15 NOTE — Progress Notes (Signed)
Transition of Care [facility]) - Inpatient Brief Assessment   Patient Details  Name: [name] [name] MRN: [id number] Date of Birth: September 27, 1961  Transition of Care [facility]) CM/SW Contact:    Larrie Kass, LCSW Phone Number: 05/15/2023, 4:11 PM   Clinical Narrative:  Transition of Care Department [facility]) has reviewed patient and no TOC needs have been identified at this time. We will continue to monitor patient advancement through interdisciplinary progression rounds. If new patient transition needs arise, please place a TOC consult.  Transition of Care Asessment: Insurance and Status: Insurance coverage has been reviewed Patient has primary care physician: Yes     Prior/Current Home Services: No current home services Social Determinants of Health Reivew: SDOH reviewed no interventions necessary Readmission risk has been reviewed: Yes Transition of care needs: no transition of care needs at this time

## 2023-05-15 NOTE — Plan of Care (Signed)
  Problem: Education: Goal: Ability to describe self-care measures that may prevent or decrease complications (Diabetes Survival Skills Education) will improve Outcome: Not Progressing Goal: Individualized Educational Video(s) Outcome: Not Progressing   Problem: Education: Goal: Individualized Educational Video(s) Outcome: Not Progressing

## 2023-05-16 DIAGNOSIS — K589 Irritable bowel syndrome without diarrhea: Secondary | ICD-10-CM | POA: Diagnosis present

## 2023-05-16 DIAGNOSIS — T383X6A Underdosing of insulin and oral hypoglycemic [antidiabetic] drugs, initial encounter: Secondary | ICD-10-CM | POA: Diagnosis present

## 2023-05-16 DIAGNOSIS — R300 Dysuria: Secondary | ICD-10-CM | POA: Diagnosis present

## 2023-05-16 DIAGNOSIS — R339 Retention of urine, unspecified: Secondary | ICD-10-CM | POA: Diagnosis not present

## 2023-05-16 DIAGNOSIS — B961 Klebsiella pneumoniae [K. pneumoniae] as the cause of diseases classified elsewhere: Secondary | ICD-10-CM | POA: Diagnosis present

## 2023-05-16 DIAGNOSIS — E1042 Type 1 diabetes mellitus with diabetic polyneuropathy: Secondary | ICD-10-CM | POA: Diagnosis present

## 2023-05-16 DIAGNOSIS — E1043 Type 1 diabetes mellitus with diabetic autonomic (poly)neuropathy: Secondary | ICD-10-CM | POA: Diagnosis present

## 2023-05-16 DIAGNOSIS — E1065 Type 1 diabetes mellitus with hyperglycemia: Secondary | ICD-10-CM | POA: Diagnosis present

## 2023-05-16 DIAGNOSIS — I951 Orthostatic hypotension: Secondary | ICD-10-CM | POA: Diagnosis present

## 2023-05-16 DIAGNOSIS — I130 Hypertensive heart and chronic kidney disease with heart failure and stage 1 through stage 4 chronic kidney disease, or unspecified chronic kidney disease: Secondary | ICD-10-CM | POA: Diagnosis present

## 2023-05-16 DIAGNOSIS — K3184 Gastroparesis: Secondary | ICD-10-CM | POA: Diagnosis present

## 2023-05-16 DIAGNOSIS — M810 Age-related osteoporosis without current pathological fracture: Secondary | ICD-10-CM | POA: Diagnosis present

## 2023-05-16 DIAGNOSIS — E1022 Type 1 diabetes mellitus with diabetic chronic kidney disease: Secondary | ICD-10-CM | POA: Diagnosis present

## 2023-05-16 DIAGNOSIS — I480 Paroxysmal atrial fibrillation: Secondary | ICD-10-CM | POA: Diagnosis present

## 2023-05-16 DIAGNOSIS — N182 Chronic kidney disease, stage 2 (mild): Secondary | ICD-10-CM | POA: Diagnosis present

## 2023-05-16 DIAGNOSIS — I2510 Atherosclerotic heart disease of native coronary artery without angina pectoris: Secondary | ICD-10-CM | POA: Diagnosis present

## 2023-05-16 DIAGNOSIS — E785 Hyperlipidemia, unspecified: Secondary | ICD-10-CM | POA: Diagnosis present

## 2023-05-16 DIAGNOSIS — I5042 Chronic combined systolic (congestive) and diastolic (congestive) heart failure: Secondary | ICD-10-CM | POA: Diagnosis present

## 2023-05-16 DIAGNOSIS — Z794 Long term (current) use of insulin: Secondary | ICD-10-CM | POA: Diagnosis not present

## 2023-05-16 DIAGNOSIS — M797 Fibromyalgia: Secondary | ICD-10-CM | POA: Diagnosis present

## 2023-05-16 DIAGNOSIS — T83518A Infection and inflammatory reaction due to other urinary catheter, initial encounter: Secondary | ICD-10-CM | POA: Diagnosis present

## 2023-05-16 DIAGNOSIS — G901 Familial dysautonomia [Riley-Day]: Secondary | ICD-10-CM | POA: Diagnosis present

## 2023-05-16 DIAGNOSIS — E039 Hypothyroidism, unspecified: Secondary | ICD-10-CM | POA: Diagnosis present

## 2023-05-16 DIAGNOSIS — K219 Gastro-esophageal reflux disease without esophagitis: Secondary | ICD-10-CM | POA: Diagnosis present

## 2023-05-16 DIAGNOSIS — M5412 Radiculopathy, cervical region: Secondary | ICD-10-CM | POA: Diagnosis present

## 2023-05-16 DIAGNOSIS — N3090 Cystitis, unspecified without hematuria: Secondary | ICD-10-CM | POA: Diagnosis present

## 2023-05-16 DIAGNOSIS — Y846 Urinary catheterization as the cause of abnormal reaction of the patient, or of later complication, without mention of misadventure at the time of the procedure: Secondary | ICD-10-CM | POA: Diagnosis present

## 2023-05-16 LAB — CBC WITH DIFFERENTIAL/PLATELET
Abs Immature Granulocytes: 0.06 10*3/uL (ref 0.00–0.07)
Basophils Absolute: 0.1 10*3/uL (ref 0.0–0.1)
Basophils Relative: 1 %
Eosinophils Absolute: 0.3 10*3/uL (ref 0.0–0.5)
Eosinophils Relative: 3 %
HCT: 42.4 % (ref 36.0–46.0)
Hemoglobin: 14 g/dL (ref 12.0–15.0)
Immature Granulocytes: 1 %
Lymphocytes Relative: 38 %
Lymphs Abs: 3.3 10*3/uL (ref 0.7–4.0)
MCH: 28.8 pg (ref 26.0–34.0)
MCHC: 33 g/dL (ref 30.0–36.0)
MCV: 87.2 fL (ref 80.0–100.0)
Monocytes Absolute: 0.7 10*3/uL (ref 0.1–1.0)
Monocytes Relative: 8 %
Neutro Abs: 4.3 10*3/uL (ref 1.7–7.7)
Neutrophils Relative %: 49 %
Platelets: 313 10*3/uL (ref 150–400)
RBC: 4.86 MIL/uL (ref 3.87–5.11)
RDW: 14.9 % (ref 11.5–15.5)
WBC: 8.7 10*3/uL (ref 4.0–10.5)
nRBC: 0 % (ref 0.0–0.2)

## 2023-05-16 LAB — COMPREHENSIVE METABOLIC PANEL
ALT: 11 U/L (ref 0–44)
AST: 10 U/L — ABNORMAL LOW (ref 15–41)
Albumin: 3.2 g/dL — ABNORMAL LOW (ref 3.5–5.0)
Alkaline Phosphatase: 52 U/L (ref 38–126)
Anion gap: 10 (ref 5–15)
BUN: 13 mg/dL (ref 6–20)
CO2: 22 mmol/L (ref 22–32)
Calcium: 8.2 mg/dL — ABNORMAL LOW (ref 8.9–10.3)
Chloride: 101 mmol/L (ref 98–111)
Creatinine, Ser: 0.69 mg/dL (ref 0.44–1.00)
GFR, Estimated: >60 mL/min (ref >60)
Glucose, Bld: 249 mg/dL — ABNORMAL HIGH (ref 70–99)
Potassium: 3.9 mmol/L (ref 3.5–5.1)
Sodium: 133 mmol/L — ABNORMAL LOW (ref 135–145)
Total Bilirubin: 0.5 mg/dL (ref 0.3–1.2)
Total Protein: 6.3 g/dL — ABNORMAL LOW (ref 6.5–8.1)

## 2023-05-16 LAB — HEMOGLOBIN A1C
Hgb A1c MFr Bld: 13.8 % — ABNORMAL HIGH (ref 4.8–5.6)
Mean Plasma Glucose: 349 mg/dL

## 2023-05-16 LAB — CULTURE, BLOOD (ROUTINE X 2)

## 2023-05-16 LAB — GLUCOSE, CAPILLARY
Glucose-Capillary: 212 mg/dL — ABNORMAL HIGH (ref 70–99)
Glucose-Capillary: 303 mg/dL — ABNORMAL HIGH (ref 70–99)

## 2023-05-16 NOTE — Discharge Summary (Signed)
Physician Discharge Summary  [name] [name] WUJ:[id number] DOB: 08-28-1961 DOA: 05/14/2023  PCP: Everardo All, NP  Admit date: 05/14/2023 Discharge date: 05/16/2023  Time spent: 47 minutes  Recommendations for Outpatient Follow-up:  Please reinforce compliance with insulin and thyroxine-patient needs a TSH in 2 to 3 weeks given her noncompliance Patient does need follow-up closely with urology given her new start to self-catheterization-needs to be monitored for technique etc. 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  Please see below for itemized issues addressed in HOpsital- refer to other progress notes for clarity if needed  Discharge Condition: Improve  Diet recommendation: Diabetic heart healthy  Filed Weights   05/14/23 1810 05/15/23 0438 05/16/23 0500  Weight: 53.6 kg 55.4 kg 55.4 kg    History of present illness:  62 year old female H/oh urinary retention Poorly controlled DM TY 1+ multiple DKA admissions + gastroparesis Hypothyroidism Prior UGIB 05/30/2019 grade D esophagitis + ulcers--- prior Rx Schatzki ring 2018+ diverticular disease + IBS Paroxysmal A-fib not on anticoagulation Autonomic dysfucntion 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  Right shoulder pain cervical radiculopathy   Present [facility] 6/4+ hyperglycemia + abdominal pain + dysuria CBG 300 WBC 11 glucose 350 anion gap 15 lactic acid 1.4 Rx IVF 1 L insulin Reglan Zofran Rocephin Foley placed     Hospital-Problem based course   ?  Cystitis-does self cath-declines Foley at the time of discharge Urine culture = 60,000  CFU--- can discontinue antibiotics as has received 3 days of antibiotics Follows with [facility]-Dr. Laverle Patter aware to ensure closer follow-up--- patient is aware that she needs to call their office to schedule an close outpatient follow-up   Hyperglycemia on admission, uncontrolled diabetes mellitus type 1--poorly controlled A1c 13.4 Patient can recite back what she normally takes pretty well--she follows with [facility] Bradly Chris, NP --she will need to follow-up closely with them CBGs not well-controlled during hospital stay and will need augmentation and teaching   Hypothyroidism noncompliant with thyroxine Resumed Synthroid 137 mcg, TSH 53 when checked Compliance needs to be reinforced   Paroxysmal A-fib HFrEF--HFmEF-cath 2024 normal ventriculogram with incidental fistula which is not acute Is on aspirin 81 only Needs to continue Ranexa for microvascular ischemia--is followed by Dr. Minna Merritts at [facility]   Autonomic dysfunction secondary to poorly controlled diabetes Continue midodrine 5 twice daily cannot tolerate GDMT and previously was supposed to wear stockings   Prior upper GI bleeds as well as IBS etc.   Discharge Exam: Vitals:   05/16/23 0822 05/16/23 1242  BP: (!) 149/92 109/78  Pulse: 87 85  Resp:  18  Temp:  98.6 F (37 C)  SpO2:  99%    Subj on day of d/c   Awake coherent "I want to go home--- I am not going home with Foley catheter" Otherwise seems well no fever chills  General Exam on discharge  EOMI NCAT no focal deficit flat affect Neck soft supple Chest clear no wheeze S1-S2 no murmur Abdomen soft  Discharge Instructions    Allergies as of 05/16/2023       Reactions   Codeine Hives, Anxiety, Other (See Comments)        Medication List  TAKE these medications    aspirin EC 81 MG tablet Take 81 mg by mouth daily. Swallow whole.   atorvastatin 80 MG tablet Commonly known as: LIPITOR Take 1 tablet (80 mg total) by  mouth daily.   Dexcom G6 Sensor Misc INJECT 1 SENSOR INTO SKIN EVERY 10 DAYS What changed: See the new instructions.   Dexcom G6 Transmitter Misc USE AS DIRECTED FOR CONTINUOUS GLUCOSE MONITORING. REUSE TRANSMITTER X 90DAYS THEN DISCARD & REPLACE   levothyroxine 137 MCG tablet Commonly known as: SYNTHROID Take 137 mcg by mouth daily before breakfast.   metoCLOPramide 5 MG tablet Commonly known as: REGLAN Take 1 tablet (5 mg total) by mouth 3 (three) times daily before meals. What changed: when to take this   midodrine 5 MG tablet Commonly known as: PROAMATINE Take 1 tablet (5 mg total) by mouth 2 (two) times daily with a meal. Hold if your systolic blood pressure  ( top number) is > , please check your blood pressure at home once a day and bring in record for your doctor to review, follow up with your pcp and cardiology regarding your blood pressure medication What changed:  when to take this additional instructions   NovoLOG FlexPen 100 UNIT/ML FlexPen Generic drug: insulin aspart Inject 4 Units into the skin See admin instructions. Inject 4 units into the skin three times a day with meals PLUS sliding scale coverage   onetouch ultrasoft lancets Use to check blood sugar daily as needed.  Dx: E11.10   pantoprazole 40 MG tablet Commonly known as: Protonix Take 1 tablet (40 mg total) by mouth 2 (two) times daily. What changed: when to take this   Pen Needles 31G X 8 MM Misc Use to inject insulin 4 times a day.  Dx: E11.10   Toujeo SoloStar 300 UNIT/ML Solostar Pen Generic drug: insulin glargine (1 Unit Dial) Inject 6 Units into the skin at bedtime. What changed: how much to take       Allergies  Allergen Reactions   Codeine Hives, Anxiety and Other (See Comments)      The results of significant diagnostics from this hospitalization (including imaging, microbiology, ancillary and laboratory) are listed below for reference.    Significant Diagnostic  Studies: CT ABDOMEN PELVIS W CONTRAST  Result Date: 05/14/2023 CLINICAL DATA:  Hyperglycemia, abdominal pain, and urinary retention EXAM: CT ABDOMEN AND PELVIS WITH CONTRAST TECHNIQUE: Multidetector CT imaging of the abdomen and pelvis was performed using the standard protocol following bolus administration of intravenous contrast. RADIATION DOSE REDUCTION: This exam was performed according to the departmental dose-optimization program which includes automated exposure control, adjustment of the mA and/or kV according to patient size and/or use of iterative reconstruction technique. CONTRAST:  OMNIPAQUE IOHEXOL 300 MG/ML  SOLN COMPARISON:  CT abdomen and pelvis dated 04/19/2023 and multiple priors dating back to 02/11/2008 FINDINGS: Lower chest: No focal consolidation or pulmonary nodule in the lung bases. No pleural effusion or pneumothorax demonstrated. Partially imaged heart size is normal. Hepatobiliary: No focal hepatic lesions. No intra or extrahepatic biliary ductal dilation. Similar appearance of the gallbladder fundus dating back to 02/11/2008 may represent adenomyomatosis. Pancreas: No focal lesions or main ductal dilation. Spleen: Normal in size without focal abnormality. Adrenals/Urinary Tract: No adrenal nodules. No suspicious renal mass, calculi or hydronephrosis. Circumferential mural thickening of the decompressed urinary bladder with catheter in-situ. Stomach/Bowel: Normal appearance of the stomach. No evidence of bowel wall thickening, distention, or inflammatory changes. Colonic diverticulosis without acute diverticulitis. Normal appendix.  Vascular/Lymphatic: Aortic atherosclerosis. No enlarged abdominal or pelvic lymph nodes. Reproductive: No adnexal masses. Other: No free fluid, fluid collection, or free air. Musculoskeletal: No acute or abnormal lytic or blastic osseous lesions. Small fat-containing paraumbilical hernia. IMPRESSION: 1. No acute infectious/inflammatory findings in the  abdomen or pelvis. 2. Urinary bladder is decompressed with catheter in-situ. 3. Aortic Atherosclerosis (ICD10-I70.0). Electronically Signed   By: Agustin Cree M.D.   On: 05/14/2023 16:11   DG Chest Port 1 View  Result Date: 05/14/2023 CLINICAL DATA:  weakness EXAM: PORTABLE CHEST - 1 VIEW COMPARISON:  05/10/2022 FINDINGS: Lungs are clear. Heart size and mediastinal contours are within normal limits. No effusion. Visualized bones unremarkable. IMPRESSION: No acute cardiopulmonary disease. Electronically Signed   By: Corlis Leak M.D.   On: 05/14/2023 14:03   CT ABDOMEN PELVIS W CONTRAST  Result Date: 04/19/2023 CLINICAL DATA:  Polyuria and dysuria for 2 days, generalized abdominal pain, nausea, vomiting. EXAM: CT ABDOMEN AND PELVIS WITH CONTRAST TECHNIQUE: Multidetector CT imaging of the abdomen and pelvis was performed using the standard protocol following bolus administration of intravenous contrast. RADIATION DOSE REDUCTION: This exam was performed according to the departmental dose-optimization program which includes automated exposure control, adjustment of the mA and/or kV according to patient size and/or use of iterative reconstruction technique. CONTRAST:  OMNIPAQUE IOHEXOL 300 MG/ML  SOLN COMPARISON:  CT abdomen/pelvis most recently 03/29/2023 FINDINGS: Lower chest: The lung bases are clear. The imaged heart is unremarkable. Hepatobiliary: The previously seen enhancing lesion in hepatic segment II is not identified on the current study. The liver is diffusely hypoattenuating suggesting fatty infiltration. There are no focal lesions on the current study. Nodular enhancement of the gallbladder fundus is unchanged since 2021 and likely reflects adenomyomatosis. The gallbladder is otherwise unremarkable. There is no biliary ductal dilatation. Pancreas: Unremarkable. Spleen: Unremarkable. Adrenals/Urinary Tract: The adrenals are unremarkable. There is mild hydroureteronephrosis due to a markedly distended  urinary bladder, similar to prior studies. There are no focal parenchymal lesions. There is no stone in either kidney or along the course of either ureter. There is excretion of contrast into the collecting systems on the delayed images. Stomach/Bowel: Mild distal esophageal wall thickening is again seen which may reflect esophagitis. The gastric mucosa is hyperenhancing. A small superiorly directed outpouching arising from the third portion of the duodenum is unchanged going back to 2022 and is consistent with a small diverticulum (2-66). There is no evidence of bowel obstruction. There is no abnormal bowel wall thickening or inflammatory change. There is colonic diverticulosis without evidence of acute diverticulitis. The appendix is normal. Vascular/Lymphatic: The abdominal aorta is normal in course and caliber with scattered calcified plaque. The major branch vessels are patent. The main portal and splenic veins are patent. There is no abdominal or pelvic lymphadenopathy. Reproductive: The uterus is surgically absent. There is no adnexal mass. Other: There is no ascites or free air. A small fat containing umbilical hernia is unchanged. Musculoskeletal: There is no acute osseous abnormality or suspicious osseous lesion. IMPRESSION: 1. Markedly distended urinary bladder with mild bilateral hydroureteronephrosis, similar to prior studies. Correlate for retention and consider decompression. 2. Hyperenhancing gastric mucosa could reflect gastritis. Mild distal esophageal wall thickening is similar to prior studies and may reflect gastritis. 3. Hepatic steatosis. The previously seen hyperenhancing lesion in the liver is not seen on the current study. 4. Diverticulosis. Electronically Signed   By: Lesia Hausen M.D.   On: 04/19/2023 10:03    Microbiology: Recent Results (from  the past 240 hour(s))  Culture, blood (routine x 2)     Status: None (Preliminary result)   Collection Time: 05/14/23  1:30 PM   Specimen:  Left Antecubital; Blood  Result Value Ref Range Status   Specimen Description   Final    LEFT ANTECUBITAL BLOOD Performed at [facility], [address] [place], [place] [address]    Special Requests   Final    BOTTLES DRAWN AEROBIC AND ANAEROBIC Blood Culture results may not be optimal due to an excessive volume of blood received in culture bottles Performed at [facility], [address], [place], [place] [address]    Culture   Final    NO GROWTH 2 DAYS Performed at [facility], [address] [place], [place] [address]    Report Status PENDING  Incomplete  Urine Culture     Status: Abnormal (Preliminary result)   Collection Time: 05/14/23  1:54 PM   Specimen: Urine, Random  Result Value Ref Range Status   Specimen Description   Final    URINE, RANDOM Performed at [facility], [address], [place], [place] [address]    Special Requests   Final    NONE Reflexed from [id number] Performed at [facility], [address], [place], [place] [address]    Culture (A)  Final    60,000 COLONIES/mL KLEBSIELLA PNEUMONIAE SUSCEPTIBILITIES TO FOLLOW Performed at [facility], [address], [place], [place] [address]    Report Status PENDING  Incomplete  Culture, blood (routine x 2)     Status: None (Preliminary result)   Collection Time: 05/14/23  5:01 PM   Specimen: BLOOD LEFT HAND  Result Value Ref Range Status   Specimen Description   Final    BLOOD LEFT HAND Performed at [facility], [address] [place], [place] [address]    Special Requests   Final    BOTTLES DRAWN AEROBIC AND ANAEROBIC Blood Culture results may not be optimal due to an excessive volume of blood received in culture bottles Performed at [facility], [address], [place], [place] [address]    Culture   Final    NO GROWTH 2 DAYS Performed at [facility], [address], [place], [place]  [address]    Report Status PENDING  Incomplete     Labs: Basic Metabolic Panel: Recent Labs  Lab 05/14/23 1330 05/15/23 0421 05/16/23 0414  NA 136 134* 133*  K 3.7 4.0 3.9  CL 95* 106 101  CO2 26 20* 22  GLUCOSE 351* 177* 249*  BUN 14 11 13   CREATININE 0.59 0.56 0.69  CALCIUM 9.1 7.8* 8.2*   Liver Function Tests: Recent Labs  Lab 05/14/23 1330 05/16/23 0414  AST 15 10*  ALT 12 11  ALKPHOS 72 52  BILITOT 0.7 0.5  PROT 8.0 6.3*  ALBUMIN 4.1 3.2*   Recent Labs  Lab 05/14/23 1330  LIPASE 68*   No results for input(s): "AMMONIA" in the last 168 hours. CBC: Recent Labs  Lab 05/14/23 1330 05/15/23 0421 05/16/23 0414  WBC 11.7* 10.3 8.7  NEUTROABS 8.9*  --  4.3  HGB 16.2* 13.5 14.0  HCT 48.6* 44.3 42.4  MCV 85.3 94.1 87.2  PLT 382 311 313   Cardiac Enzymes: No results for input(s): "CKTOTAL", "CKMB", "CKMBINDEX", "TROPONINI" in the last 168 hours. BNP: BNP (last 3 results) No results for input(s): "BNP" in  the last 8760 hours.  ProBNP (last 3 results) No results for input(s): "PROBNP" in the last 8760 hours.  CBG: Recent Labs  Lab 05/15/23 1203 05/15/23 1727 05/15/23 2122 05/16/23 0737 05/16/23 1120  GLUCAP 273* 147* 247* 212* 303*       Signed:  Rhetta Mura MD   [facility] 05/16/2023, 12:56 PM

## 2023-05-16 NOTE — Progress Notes (Signed)
Patient is being discharged home. All discharge instructions including medications and returning to self cath at home reviewed with pt. Pt verbalized a full understanding. Pt husband is her ride home.

## 2023-05-16 NOTE — Inpatient Diabetes Management (Signed)
Inpatient Diabetes Program Recommendations  AACE/ADA: New Consensus Statement on Inpatient Glycemic Control (2015)  Target Ranges:  Prepandial:   less than 140 mg/dL      Peak postprandial:   less than 180 mg/dL (1-2 hours)      Critically ill patients:  140 - 180 mg/dL   Lab Results  Component Value Date   GLUCAP 212 (H) 05/16/2023   HGBA1C 13.3 (H) 02/04/2023    Review of Glycemic Control  Diabetes history: DM2 Outpatient Diabetes medications: Toujeo 6 units QHS, Novolog 4 units + s/s Current orders for Inpatient glycemic control: Semglee 10 units QHS, Novolog 0-15 units TID  HgbA1C - 13.3% CBG 212 this am  Inpatient Diabetes Program Recommendations:    Increase Semglee to 12 units QHS  Add Novolog 3 units TID with meals if eating > 50%  Add Novolog 0-5 at [facility] for correction  Spoke with pt at bedside late yesterday afternoon. Pt states she was not taking the full dose of her insulins d/t "trying to make it stretch since it's so expensive." Inquired as to whether pt had contacted her PCP to possibly get insulin that is more cost effective, and she said No. Monitors blood sugars with meter and Dexcom. Needs to work on [employer], portion control and exercise.  Will need to be discharged home on affordable insulin.   Continue to follow.  Thank you. Ailene Ards, RD, LDN, CDCES Inpatient Diabetes Coordinator [phone]

## 2023-05-17 LAB — URINE CULTURE

## 2023-05-18 LAB — URINE CULTURE: Culture: 60000 — AB

## 2023-05-18 LAB — CULTURE, BLOOD (ROUTINE X 2)

## 2023-05-19 LAB — CULTURE, BLOOD (ROUTINE X 2): Culture: NO GROWTH

## 2023-06-14 ENCOUNTER — Emergency Department (HOSPITAL_COMMUNITY): Payer: BC Managed Care – PPO

## 2023-06-14 ENCOUNTER — Other Ambulatory Visit: Payer: Self-pay

## 2023-06-14 ENCOUNTER — Inpatient Hospital Stay (HOSPITAL_COMMUNITY)
Admission: EM | Admit: 2023-06-14 | Discharge: 2023-06-17 | DRG: 074 | Disposition: A | Discharge status: Home / Self Care | Payer: BC Managed Care – PPO | Attending: Internal Medicine | Admitting: Internal Medicine

## 2023-06-14 DIAGNOSIS — Y92009 Unspecified place in unspecified non-institutional (private) residence as the place of occurrence of the external cause: Secondary | ICD-10-CM | POA: Diagnosis not present

## 2023-06-14 DIAGNOSIS — Z90710 Acquired absence of both cervix and uterus: Secondary | ICD-10-CM

## 2023-06-14 DIAGNOSIS — Z91128 Patient's intentional underdosing of medication regimen for other reason: Secondary | ICD-10-CM | POA: Diagnosis not present

## 2023-06-14 DIAGNOSIS — I129 Hypertensive chronic kidney disease with stage 1 through stage 4 chronic kidney disease, or unspecified chronic kidney disease: Secondary | ICD-10-CM | POA: Diagnosis not present

## 2023-06-14 DIAGNOSIS — R339 Retention of urine, unspecified: Secondary | ICD-10-CM | POA: Diagnosis present

## 2023-06-14 DIAGNOSIS — Z91148 Patient's other noncompliance with medication regimen for other reason: Secondary | ICD-10-CM | POA: Diagnosis not present

## 2023-06-14 DIAGNOSIS — E785 Hyperlipidemia, unspecified: Secondary | ICD-10-CM | POA: Diagnosis not present

## 2023-06-14 DIAGNOSIS — E1043 Type 1 diabetes mellitus with diabetic autonomic (poly)neuropathy: Principal | ICD-10-CM | POA: Diagnosis present

## 2023-06-14 DIAGNOSIS — Z5329 Procedure and treatment not carried out because of patient's decision for other reasons: Secondary | ICD-10-CM | POA: Diagnosis present

## 2023-06-14 DIAGNOSIS — Z79899 Other long term (current) drug therapy: Secondary | ICD-10-CM

## 2023-06-14 DIAGNOSIS — R112 Nausea with vomiting, unspecified: Secondary | ICD-10-CM

## 2023-06-14 DIAGNOSIS — E1065 Type 1 diabetes mellitus with hyperglycemia: Secondary | ICD-10-CM | POA: Diagnosis present

## 2023-06-14 DIAGNOSIS — T381X6A Underdosing of thyroid hormones and substitutes, initial encounter: Secondary | ICD-10-CM | POA: Diagnosis present

## 2023-06-14 DIAGNOSIS — E039 Hypothyroidism, unspecified: Secondary | ICD-10-CM | POA: Diagnosis not present

## 2023-06-14 DIAGNOSIS — F1290 Cannabis use, unspecified, uncomplicated: Secondary | ICD-10-CM | POA: Diagnosis present

## 2023-06-14 DIAGNOSIS — D72829 Elevated white blood cell count, unspecified: Secondary | ICD-10-CM | POA: Diagnosis present

## 2023-06-14 DIAGNOSIS — N182 Chronic kidney disease, stage 2 (mild): Secondary | ICD-10-CM | POA: Diagnosis not present

## 2023-06-14 DIAGNOSIS — Z87891 Personal history of nicotine dependence: Secondary | ICD-10-CM | POA: Diagnosis not present

## 2023-06-14 DIAGNOSIS — Z91119 Patient's noncompliance with dietary regimen due to unspecified reason: Secondary | ICD-10-CM | POA: Diagnosis not present

## 2023-06-14 DIAGNOSIS — K3184 Gastroparesis: Secondary | ICD-10-CM | POA: Diagnosis present

## 2023-06-14 DIAGNOSIS — Z8249 Family history of ischemic heart disease and other diseases of the circulatory system: Secondary | ICD-10-CM | POA: Diagnosis not present

## 2023-06-14 DIAGNOSIS — F419 Anxiety disorder, unspecified: Secondary | ICD-10-CM | POA: Diagnosis present

## 2023-06-14 DIAGNOSIS — Z794 Long term (current) use of insulin: Secondary | ICD-10-CM

## 2023-06-14 DIAGNOSIS — R109 Unspecified abdominal pain: Secondary | ICD-10-CM | POA: Diagnosis not present

## 2023-06-14 DIAGNOSIS — R1111 Vomiting without nausea: Secondary | ICD-10-CM | POA: Diagnosis not present

## 2023-06-14 DIAGNOSIS — R739 Hyperglycemia, unspecified: Secondary | ICD-10-CM | POA: Diagnosis not present

## 2023-06-14 DIAGNOSIS — Z833 Family history of diabetes mellitus: Secondary | ICD-10-CM

## 2023-06-14 DIAGNOSIS — Z79890 Hormone replacement therapy (postmenopausal): Secondary | ICD-10-CM | POA: Diagnosis not present

## 2023-06-14 DIAGNOSIS — E1022 Type 1 diabetes mellitus with diabetic chronic kidney disease: Secondary | ICD-10-CM | POA: Diagnosis present

## 2023-06-14 DIAGNOSIS — F320 Major depressive disorder, single episode, mild: Secondary | ICD-10-CM

## 2023-06-14 DIAGNOSIS — M810 Age-related osteoporosis without current pathological fracture: Secondary | ICD-10-CM | POA: Diagnosis present

## 2023-06-14 DIAGNOSIS — Z7982 Long term (current) use of aspirin: Secondary | ICD-10-CM

## 2023-06-14 DIAGNOSIS — Z885 Allergy status to narcotic agent status: Secondary | ICD-10-CM

## 2023-06-14 DIAGNOSIS — N3289 Other specified disorders of bladder: Secondary | ICD-10-CM | POA: Diagnosis not present

## 2023-06-14 DIAGNOSIS — K449 Diaphragmatic hernia without obstruction or gangrene: Secondary | ICD-10-CM | POA: Diagnosis not present

## 2023-06-14 DIAGNOSIS — Z820 Family history of epilepsy and other diseases of the nervous system: Secondary | ICD-10-CM

## 2023-06-14 DIAGNOSIS — R748 Abnormal levels of other serum enzymes: Secondary | ICD-10-CM | POA: Diagnosis present

## 2023-06-14 DIAGNOSIS — E1042 Type 1 diabetes mellitus with diabetic polyneuropathy: Secondary | ICD-10-CM | POA: Diagnosis not present

## 2023-06-14 DIAGNOSIS — I10 Essential (primary) hypertension: Secondary | ICD-10-CM | POA: Diagnosis not present

## 2023-06-14 DIAGNOSIS — R110 Nausea: Secondary | ICD-10-CM | POA: Diagnosis not present

## 2023-06-14 DIAGNOSIS — K219 Gastro-esophageal reflux disease without esophagitis: Secondary | ICD-10-CM | POA: Diagnosis not present

## 2023-06-14 DIAGNOSIS — E1165 Type 2 diabetes mellitus with hyperglycemia: Secondary | ICD-10-CM | POA: Diagnosis not present

## 2023-06-14 DIAGNOSIS — I2510 Atherosclerotic heart disease of native coronary artery without angina pectoris: Secondary | ICD-10-CM | POA: Diagnosis present

## 2023-06-14 DIAGNOSIS — I998 Other disorder of circulatory system: Secondary | ICD-10-CM | POA: Diagnosis present

## 2023-06-14 LAB — COMPREHENSIVE METABOLIC PANEL
ALT: 15 U/L (ref 0–44)
AST: 24 U/L (ref 15–41)
Albumin: 4.4 g/dL (ref 3.5–5.0)
Alkaline Phosphatase: 79 U/L (ref 38–126)
Anion gap: 13 (ref 5–15)
BUN: 21 mg/dL — ABNORMAL HIGH (ref 6–20)
CO2: 23 mmol/L (ref 22–32)
Calcium: 8.9 mg/dL (ref 8.9–10.3)
Chloride: 97 mmol/L — ABNORMAL LOW (ref 98–111)
Creatinine, Ser: 0.59 mg/dL (ref 0.44–1.00)
GFR, Estimated: >60 mL/min (ref >60)
Glucose, Bld: 400 mg/dL — ABNORMAL HIGH (ref 70–99)
Potassium: 4.3 mmol/L (ref 3.5–5.1)
Sodium: 133 mmol/L — ABNORMAL LOW (ref 135–145)
Total Bilirubin: 1 mg/dL (ref 0.3–1.2)
Total Protein: 8.3 g/dL — ABNORMAL HIGH (ref 6.5–8.1)

## 2023-06-14 LAB — BLOOD GAS, VENOUS
Acid-Base Excess: 2.1 mmol/L — ABNORMAL HIGH (ref 0.0–2.0)
Bicarbonate: 25.5 mmol/L (ref 20.0–28.0)
O2 Saturation: 100 %
Patient temperature: 37
pCO2, Ven: 35 mmHg — ABNORMAL LOW (ref 44–60)
pH, Ven: 7.47 — ABNORMAL HIGH (ref 7.25–7.43)
pO2, Ven: 174 mmHg — ABNORMAL HIGH (ref 32–45)

## 2023-06-14 LAB — RAPID URINE DRUG SCREEN, HOSP PERFORMED
Amphetamines: NOT DETECTED
Barbiturates: NOT DETECTED
Benzodiazepines: NOT DETECTED
Cocaine: NOT DETECTED
Opiates: NOT DETECTED
Tetrahydrocannabinol: POSITIVE — AB

## 2023-06-14 LAB — URINALYSIS, ROUTINE W REFLEX MICROSCOPIC
Bilirubin Urine: NEGATIVE
Glucose, UA: >=500 mg/dL — AB
Ketones, ur: 20 mg/dL — AB
Leukocytes,Ua: NEGATIVE
Nitrite: NEGATIVE
Protein, ur: 30 mg/dL — AB
Specific Gravity, Urine: 1.022 (ref 1.005–1.030)
pH: 7 (ref 5.0–8.0)

## 2023-06-14 LAB — CBC WITH DIFFERENTIAL/PLATELET
Abs Immature Granulocytes: 0.08 10*3/uL — ABNORMAL HIGH (ref 0.00–0.07)
Basophils Absolute: 0.1 10*3/uL (ref 0.0–0.1)
Basophils Relative: 1 %
Eosinophils Absolute: 0.1 10*3/uL (ref 0.0–0.5)
Eosinophils Relative: 0 %
HCT: 48.1 % — ABNORMAL HIGH (ref 36.0–46.0)
Hemoglobin: 16.1 g/dL — ABNORMAL HIGH (ref 12.0–15.0)
Immature Granulocytes: 1 %
Lymphocytes Relative: 16 %
Lymphs Abs: 2.1 10*3/uL (ref 0.7–4.0)
MCH: 28.9 pg (ref 26.0–34.0)
MCHC: 33.5 g/dL (ref 30.0–36.0)
MCV: 86.4 fL (ref 80.0–100.0)
Monocytes Absolute: 0.5 10*3/uL (ref 0.1–1.0)
Monocytes Relative: 4 %
Neutro Abs: 10.6 10*3/uL — ABNORMAL HIGH (ref 1.7–7.7)
Neutrophils Relative %: 78 %
Platelets: 372 10*3/uL (ref 150–400)
RBC: 5.57 MIL/uL — ABNORMAL HIGH (ref 3.87–5.11)
RDW: 14.8 % (ref 11.5–15.5)
WBC: 13.4 10*3/uL — ABNORMAL HIGH (ref 4.0–10.5)
nRBC: 0 % (ref 0.0–0.2)

## 2023-06-14 LAB — GLUCOSE, CAPILLARY
Glucose-Capillary: 249 mg/dL — ABNORMAL HIGH (ref 70–99)
Glucose-Capillary: 203 mg/dL — ABNORMAL HIGH (ref 70–99)

## 2023-06-14 LAB — BETA-HYDROXYBUTYRIC ACID: Beta-Hydroxybutyric Acid: 1.5 mmol/L — ABNORMAL HIGH (ref 0.05–0.27)

## 2023-06-14 LAB — CBG MONITORING, ED
Glucose-Capillary: 391 mg/dL — ABNORMAL HIGH (ref 70–99)
Glucose-Capillary: 422 mg/dL — ABNORMAL HIGH (ref 70–99)
Glucose-Capillary: 287 mg/dL — ABNORMAL HIGH (ref 70–99)

## 2023-06-14 LAB — TSH: TSH: 26.97 u[IU]/mL — ABNORMAL HIGH (ref 0.350–4.500)

## 2023-06-14 LAB — LIPASE, BLOOD: Lipase: 63 U/L — ABNORMAL HIGH (ref 11–51)

## 2023-06-14 MED ORDER — SODIUM CHLORIDE 0.9 % IV SOLN
12.5000 mg | Freq: Four times a day (QID) | INTRAVENOUS | Status: DC | PRN
  Administered 2023-06-15 (×3): 12.5 mg via INTRAVENOUS
  Filled 2023-06-14: qty 12.5
  Filled 2023-06-14: qty 0.5
  Filled 2023-06-14: qty 12.5

## 2023-06-14 MED ORDER — ACETAMINOPHEN 325 MG PO TABS
650.0000 mg | ORAL_TABLET | Freq: Four times a day (QID) | ORAL | Status: DC | PRN
  Administered 2023-06-15: 650 mg via ORAL
  Filled 2023-06-14: qty 2

## 2023-06-14 MED ORDER — INSULIN ASPART 100 UNIT/ML IJ SOLN
5.0000 [IU] | Freq: Once | INTRAMUSCULAR | Status: AC
  Administered 2023-06-14: 5 [IU] via SUBCUTANEOUS
  Filled 2023-06-14: qty 0.05

## 2023-06-14 MED ORDER — RANOLAZINE ER 500 MG PO TB12
500.0000 mg | ORAL_TABLET | Freq: Two times a day (BID) | ORAL | Status: DC
  Administered 2023-06-14 – 2023-06-16 (×5): 500 mg via ORAL
  Filled 2023-06-14 (×7): qty 1

## 2023-06-14 MED ORDER — ONDANSETRON HCL 4 MG/2ML IJ SOLN
4.0000 mg | Freq: Once | INTRAMUSCULAR | Status: AC
  Administered 2023-06-14: 4 mg via INTRAVENOUS
  Filled 2023-06-14: qty 2

## 2023-06-14 MED ORDER — INSULIN ASPART 100 UNIT/ML IJ SOLN
5.0000 [IU] | Freq: Once | INTRAMUSCULAR | Status: AC
  Administered 2023-06-14: 5 [IU] via INTRAVENOUS
  Filled 2023-06-14: qty 0.05

## 2023-06-14 MED ORDER — ACETAMINOPHEN 650 MG RE SUPP: 650.0000 mg | Freq: Four times a day (QID) | RECTAL | Status: DC | PRN

## 2023-06-14 MED ORDER — TRAZODONE HCL 50 MG PO TABS
25.0000 mg | ORAL_TABLET | Freq: Every evening | ORAL | Status: DC | PRN
  Administered 2023-06-14: 25 mg via ORAL
  Filled 2023-06-14: qty 1

## 2023-06-14 MED ORDER — METOCLOPRAMIDE HCL 5 MG PO TABS
5.0000 mg | ORAL_TABLET | Freq: Three times a day (TID) | ORAL | Status: DC
  Administered 2023-06-15 – 2023-06-16 (×5): 5 mg via ORAL
  Filled 2023-06-14: qty 1
  Filled 2023-06-14 (×3): qty 0.5
  Filled 2023-06-14: qty 1
  Filled 2023-06-14: qty 0.5
  Filled 2023-06-14: qty 1
  Filled 2023-06-14: qty 0.5
  Filled 2023-06-14 (×2): qty 1

## 2023-06-14 MED ORDER — MIDODRINE HCL 5 MG PO TABS
5.0000 mg | ORAL_TABLET | Freq: Two times a day (BID) | ORAL | Status: DC
  Administered 2023-06-14 – 2023-06-16 (×5): 5 mg via ORAL
  Filled 2023-06-14 (×7): qty 1

## 2023-06-14 MED ORDER — INSULIN ASPART 100 UNIT/ML IJ SOLN
0.0000 [IU] | Freq: Three times a day (TID) | INTRAMUSCULAR | Status: DC
  Administered 2023-06-14: 5 [IU] via SUBCUTANEOUS
  Administered 2023-06-15: 15 [IU] via SUBCUTANEOUS
  Administered 2023-06-15 – 2023-06-16 (×2): 3 [IU] via SUBCUTANEOUS
  Administered 2023-06-16: 5 [IU] via SUBCUTANEOUS
  Administered 2023-06-16: 3 [IU] via SUBCUTANEOUS
  Filled 2023-06-14: qty 0.15

## 2023-06-14 MED ORDER — ASPIRIN 81 MG PO TBEC
81.0000 mg | DELAYED_RELEASE_TABLET | Freq: Every day | ORAL | Status: DC
  Administered 2023-06-15 – 2023-06-16 (×2): 81 mg via ORAL
  Filled 2023-06-14 (×2): qty 1

## 2023-06-14 MED ORDER — FAMOTIDINE IN NACL 20-0.9 MG/50ML-% IV SOLN
20.0000 mg | Freq: Once | INTRAVENOUS | Status: AC
  Administered 2023-06-14: 20 mg via INTRAVENOUS
  Filled 2023-06-14: qty 50

## 2023-06-14 MED ORDER — INSULIN GLARGINE-YFGN 100 UNIT/ML ~~LOC~~ SOLN
8.0000 [IU] | Freq: Every day | SUBCUTANEOUS | Status: DC
  Administered 2023-06-14 – 2023-06-16 (×3): 8 [IU] via SUBCUTANEOUS
  Filled 2023-06-14 (×3): qty 0.08

## 2023-06-14 MED ORDER — SODIUM CHLORIDE 0.9 % IV SOLN: INTRAVENOUS | Status: DC

## 2023-06-14 MED ORDER — ENOXAPARIN SODIUM 40 MG/0.4ML IJ SOSY
40.0000 mg | PREFILLED_SYRINGE | INTRAMUSCULAR | Status: DC
  Administered 2023-06-14 – 2023-06-16 (×3): 40 mg via SUBCUTANEOUS
  Filled 2023-06-14 (×3): qty 0.4

## 2023-06-14 MED ORDER — SODIUM CHLORIDE (PF) 0.9 % IJ SOLN
INTRAMUSCULAR | Status: AC
  Filled 2023-06-14: qty 50

## 2023-06-14 MED ORDER — INSULIN ASPART 100 UNIT/ML IJ SOLN
0.0000 [IU] | Freq: Every day | INTRAMUSCULAR | Status: DC
  Administered 2023-06-14: 2 [IU] via SUBCUTANEOUS
  Administered 2023-06-15: 3 [IU] via SUBCUTANEOUS
  Filled 2023-06-14: qty 0.05

## 2023-06-14 MED ORDER — LEVOTHYROXINE SODIUM 25 MCG PO TABS
137.0000 ug | ORAL_TABLET | Freq: Every day | ORAL | Status: DC
  Administered 2023-06-15 – 2023-06-17 (×3): 137 ug via ORAL
  Filled 2023-06-14 (×3): qty 1

## 2023-06-14 MED ORDER — METOCLOPRAMIDE HCL 5 MG/ML IJ SOLN
10.0000 mg | Freq: Four times a day (QID) | INTRAMUSCULAR | Status: DC | PRN
  Administered 2023-06-14 – 2023-06-15 (×3): 10 mg via INTRAVENOUS
  Filled 2023-06-14 (×3): qty 2

## 2023-06-14 MED ORDER — PANTOPRAZOLE SODIUM 40 MG PO TBEC
40.0000 mg | DELAYED_RELEASE_TABLET | Freq: Two times a day (BID) | ORAL | Status: DC
  Administered 2023-06-14 – 2023-06-16 (×5): 40 mg via ORAL
  Filled 2023-06-14 (×5): qty 1

## 2023-06-14 MED ORDER — IOHEXOL 300 MG/ML  SOLN
100.0000 mL | Freq: Once | INTRAMUSCULAR | Status: AC | PRN
  Administered 2023-06-14: 100 mL via INTRAVENOUS

## 2023-06-14 MED ORDER — ALBUTEROL SULFATE (2.5 MG/3ML) 0.083% IN NEBU: 2.5000 mg | INHALATION_SOLUTION | RESPIRATORY_TRACT | Status: DC | PRN

## 2023-06-14 MED ORDER — SODIUM CHLORIDE 0.9 % IV BOLUS
1000.0000 mL | Freq: Once | INTRAVENOUS | Status: AC
  Administered 2023-06-14: 1000 mL via INTRAVENOUS

## 2023-06-14 MED ORDER — METOCLOPRAMIDE HCL 5 MG/ML IJ SOLN
5.0000 mg | Freq: Once | INTRAMUSCULAR | Status: AC
  Administered 2023-06-14: 5 mg via INTRAVENOUS
  Filled 2023-06-14: qty 2

## 2023-06-14 MED ORDER — ATORVASTATIN CALCIUM 40 MG PO TABS
80.0000 mg | ORAL_TABLET | Freq: Every day | ORAL | Status: DC
  Administered 2023-06-15 – 2023-06-16 (×2): 80 mg via ORAL
  Filled 2023-06-14 (×2): qty 2

## 2023-06-14 NOTE — ED Notes (Signed)
ED TO INPATIENT HANDOFF REPORT  Name/Age/Gender [name] [name] 62 y.o. female  Code Status    Code Status Orders  (From admission, onward)           Start     Ordered   06/14/23 1522  Full code  Continuous       Question:  By:  Answer:  Consent: discussion documented in EHR   06/14/23 1522           Code Status History     Date Active Date Inactive Code Status Order ID Comments User Context   05/14/2023 1659 05/16/2023 1837 Full Code [id number]  Orland Mustard, MD ED   04/19/2023 1442 04/21/2023 2059 Full Code [id number]  Maryln Gottron, MD ED   02/02/2023 1659 02/06/2023 1828 Full Code [id number]  Alwyn Ren, MD ED   01/06/2023 1921 01/08/2023 1944 Full Code [id number]  Anselm Jungling, DO ED   03/27/2022 1948 03/29/2022 1959 Full Code [id number]  Teddy Spike, DO Inpatient   01/18/2022 1249 01/19/2022 1533 Full Code [id number]  Wynetta Fines, MD ED   08/16/2020 1031 08/17/2020 1553 Full Code [id number]  Jae Dire, MD ED   05/30/2019 0546 06/03/2019 2108 Full Code [id number]  Briscoe Deutscher, MD Inpatient   04/23/2019 1450 04/24/2019 1442 Full Code [id number]  Jonah Blue, MD ED   03/25/2019 1934 03/28/2019 0011 Full Code [id number]  Barnetta Chapel, MD Inpatient   03/25/2019 1934 03/25/2019 1934 Full Code [id number]  Barnetta Chapel, MD Inpatient   01/12/2019 1912 01/14/2019 1642 Full Code [id number]  Alba Cory, MD ED   11/26/2018 1539 11/28/2018 1346 Full Code [id number]  Bobette Mo, MD ED   11/26/2018 1530 11/26/2018 1539 Full Code [id number]  Bobette Mo, MD ED   10/19/2018 1541 10/20/2018 2122 Full Code [id number]  Inez Catalina, MD Inpatient   09/22/2018 1645 09/23/2018 1510 Full Code [id number]  Zigmund Daniel., MD Inpatient   09/22/2018 1645 09/22/2018 1645 Full Code [id number]  Zigmund Daniel., MD Inpatient   09/11/2018 1452 09/12/2018 1621 Full Code [id number]  Jonah Blue, MD ED   08/04/2018 1038 08/05/2018 2012 Full Code [id number]   Gwenyth Bender, NP ED   06/27/2018 1633 06/29/2018 1757 Full Code [id number]  Leatha Gilding, MD ED   05/26/2018 0242 05/28/2018 2108 Full Code [id number]  Eduard Clos, MD ED   03/06/2018 2107 03/08/2018 1752 Full Code [id number]  Briscoe Deutscher, MD ED   02/02/2018 1129 02/04/2018 1854 Full Code [id number]  Haydee Monica, MD ED   12/11/2017 1232 12/14/2017 1658 Full Code [id number]  Marcos Eke, PA-C ED   09/19/2017 0746 09/23/2017 1924 Full Code [id number]  Marcos Eke, PA-C ED   09/17/2017 2203 09/18/2017 1940 Full Code [id number]  Clydie Braun, MD ED   06/22/2017 1709 06/24/2017 1930 Full Code [id number]  Jordan Hawks, DO Inpatient   06/03/2017 1626 06/05/2017 1955 Full Code [id number]  Marcos Eke, PA-C ED   04/05/2017 1143 04/08/2017 2117 Full Code [id number]  Gwenyth Bender, NP ED   03/22/2017 2120 03/23/2017 1831 Full Code [id number]  Alberteen Sam, MD Inpatient   02/09/2017 0958 02/11/2017 2302 Full Code [id number]  Russella Dar, NP ED   07/24/2016 1633 07/26/2016 2113 Full Code [id number]  Ozella Rocks, MD ED   07/24/2016 1633 07/24/2016 1633 Full Code [id number]  Ozella Rocks, MD ED   10/28/2013 2338 10/29/2013  1749 Full Code [id number]  Eddie North, MD ED   09/18/2013 1649 09/20/2013 1637 Full Code [id number]  Hollice Espy, MD Inpatient   09/23/2012 1135 09/25/2012 1753 Full Code [id number]  Pat Patrick, RN Inpatient       Home/SNF/Other Home  Chief Complaint Intractable vomiting with nausea [R11.2]  Level of Care/Admitting Diagnosis ED Disposition     ED Disposition  Admit   Condition  --   Comment  Hospital Area: [facility] [[id number]]  Level of Care: Med-Surg [16]  May place patient in observation at [facility] or [facility] if equivalent level of care is available:: Yes  Covid Evaluation: Asymptomatic - no recent exposure (last 10 days) testing not required  Diagnosis: Intractable vomiting with nausea  [2130865]  Admitting Physician: Maryln Gottron [[id number]]  Attending Physician: Kirby Crigler, MIR [place] [[id number]]          Medical History Past Medical History:  Diagnosis Date   Allergy    seasonal   Anxiety    B12 deficiency    Chest pain 04/05/2017   CHF (congestive heart failure) ([name])    Diabetes mellitus type 1, uncontrolled    "dr. 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) 10/29/2013   Leg cramping    "@ night" (09/18/2013)   Migraine    "once q couple months" (11/26/2018)   Osteoporosis    Peripheral neuropathy    Umbilical hernia    unrepaired (09/18/2013)    Allergies Allergies  Allergen Reactions   Codeine Hives, Anxiety and Other (See Comments)    IV Location/Drains/Wounds Patient Lines/Drains/Airways Status     Active Line/Drains/Airways     Name Placement date Placement time Site Days   Peripheral IV 06/14/23 22 G 1.25" Posterior;Right Hand 06/14/23  0949  Hand  less than 1            Labs/Imaging Results for orders placed or performed during the hospital encounter of 06/14/23 (from the past 48 hour(s))  CBG monitoring, ED     Status: Abnormal   Collection Time: 06/14/23  9:21 AM  Result Value Ref Range   Glucose-Capillary 391 (H) 70 - 99 mg/dL    Comment: Glucose reference range applies only to samples taken after fasting for at least 8 hours.   Comment 1 Notify RN   CBC with Differential     Status: Abnormal   Collection Time: 06/14/23 10:08 AM  Result Value Ref Range   WBC 13.4 (H) 4.0 - 10.5 K/uL   RBC 5.57 (H) 3.87 - 5.11 MIL/uL   Hemoglobin 16.1 (H) 12.0 - 15.0 g/dL   HCT 24.4 (H) 01.0 - 27.2 %   MCV 86.4 80.0 - 100.0 fL   MCH 28.9 26.0 - 34.0 pg   MCHC 33.5 30.0 - 36.0  g/dL   RDW 53.6 64.4 - 03.4 %   Platelets 372 150 - 400 K/uL   nRBC 0.0 0.0 - 0.2 %   Neutrophils Relative % 78 %   Neutro Abs 10.6 (H) 1.7 - 7.7 K/uL   Lymphocytes Relative 16 %   Lymphs Abs 2.1 0.7 - 4.0 K/uL   Monocytes Relative 4 %   Monocytes Absolute 0.5 0.1 -  1.0 K/uL   Eosinophils Relative 0 %   Eosinophils Absolute 0.1 0.0 - 0.5 K/uL   Basophils Relative 1 %   Basophils Absolute 0.1 0.0 - 0.1 K/uL   Immature Granulocytes 1 %   Abs Immature Granulocytes 0.08 (H) 0.00 - 0.07 K/uL    Comment: Performed at [facility], [address] [place], [place] [address]  Comprehensive metabolic panel     Status: Abnormal   Collection Time: 06/14/23 10:08 AM  Result Value Ref Range   Sodium 133 (L) 135 - 145 mmol/L   Potassium 4.3 3.5 - 5.1 mmol/L    Comment: HEMOLYSIS AT THIS LEVEL MAY AFFECT RESULT   Chloride 97 (L) 98 - 111 mmol/L   CO2 23 22 - 32 mmol/L   Glucose, Bld 400 (H) 70 - 99 mg/dL    Comment: Glucose reference range applies only to samples taken after fasting for at least 8 hours.   BUN 21 (H) 6 - 20 mg/dL   Creatinine, Ser 6.04 0.44 - 1.00 mg/dL   Calcium 8.9 8.9 - 54.0 mg/dL   Total Protein 8.3 (H) 6.5 - 8.1 g/dL   Albumin 4.4 3.5 - 5.0 g/dL   AST 24 15 - 41 U/L    Comment: HEMOLYSIS AT THIS LEVEL MAY AFFECT RESULT   ALT 15 0 - 44 U/L    Comment: HEMOLYSIS AT THIS LEVEL MAY AFFECT RESULT   Alkaline Phosphatase 79 38 - 126 U/L   Total Bilirubin 1.0 0.3 - 1.2 mg/dL    Comment: HEMOLYSIS AT THIS LEVEL MAY AFFECT RESULT   GFR, Estimated >60 >60 mL/min    Comment: (NOTE) Calculated using the CKD-EPI Creatinine Equation (2021)    Anion gap 13 5 - 15    Comment: Performed at [facility], [address], [place], [place] [address]  Lipase, blood     Status: Abnormal   Collection Time: 06/14/23 10:08 AM  Result Value Ref Range   Lipase 63 (H) 11 - 51 U/L    Comment: Performed at [facility], [address], [place], [place] [address]  Blood gas, venous     Status: Abnormal   Collection Time: 06/14/23 10:08 AM  Result Value Ref Range   pH, Ven 7.47 (H) 7.25 - 7.43   pCO2, Ven 35 (L) 44 - 60 mmHg   pO2, Ven 174 (H) 32 - 45 mmHg   Bicarbonate 25.5 20.0 - 28.0 mmol/L   Acid-Base Excess 2.1 (H) 0.0 - 2.0 mmol/L   O2 Saturation 100 %   Patient temperature 37.0     Comment: Performed at [facility], [address], [place], [place] [address]  Beta-hydroxybutyric acid     Status: Abnormal   Collection Time: 06/14/23 10:08 AM  Result Value Ref Range   Beta-Hydroxybutyric Acid 1.50 (H) 0.05 - 0.27 mmol/L    Comment: Performed at [facility], [address], [place], [place] [address]  Urinalysis, Routine w reflex microscopic -Urine, Clean Catch     Status: Abnormal   Collection Time: 06/14/23 11:54 AM  Result Value Ref Range   Color, Urine YELLOW YELLOW   APPearance CLOUDY (A) CLEAR   Specific Gravity, Urine 1.022 1.005 - 1.030   pH 7.0 5.0 - 8.0   Glucose, UA >=500 (A) NEGATIVE mg/dL   Hgb urine dipstick SMALL (A) NEGATIVE   Bilirubin Urine NEGATIVE NEGATIVE   Ketones, ur 20 (A) NEGATIVE mg/dL   Protein,  ur 30 (A) NEGATIVE mg/dL   Nitrite NEGATIVE NEGATIVE   Leukocytes,Ua NEGATIVE NEGATIVE   RBC / HPF 11-20 0 - 5 RBC/hpf   WBC, UA 6-10 0 - 5 WBC/hpf   Bacteria, UA MANY (A) NONE SEEN   Squamous Epithelial / HPF 6-10 0 - 5 /HPF   Mucus PRESENT     Comment: Performed at [facility], [address] [place], [place] [address]  Rapid urine drug screen (hospital performed)     Status: Abnormal   Collection Time: 06/14/23 11:54 AM  Result Value Ref Range   Opiates NONE DETECTED NONE DETECTED   Cocaine NONE DETECTED NONE DETECTED   Benzodiazepines NONE DETECTED NONE DETECTED   Amphetamines NONE DETECTED NONE DETECTED   Tetrahydrocannabinol POSITIVE (A) NONE DETECTED   Barbiturates NONE DETECTED NONE DETECTED    Comment:  (NOTE) DRUG SCREEN FOR MEDICAL PURPOSES ONLY.  IF CONFIRMATION IS NEEDED FOR ANY PURPOSE, NOTIFY LAB WITHIN 5 DAYS.  LOWEST DETECTABLE LIMITS FOR URINE DRUG SCREEN Drug Class                     Cutoff (ng/mL) Amphetamine and metabolites    1000 Barbiturate and metabolites    200 Benzodiazepine                 200 Opiates and metabolites        300 Cocaine and metabolites        300 THC                            50 Performed at [facility], [address] [place], [place] [address]   POC CBG, ED     Status: Abnormal   Collection Time: 06/14/23 12:15 PM  Result Value Ref Range   Glucose-Capillary 422 (H) 70 - 99 mg/dL    Comment: Glucose reference range applies only to samples taken after fasting for at least 8 hours.   Comment 1 Notify RN   POC CBG, ED     Status: Abnormal   Collection Time: 06/14/23  1:38 PM  Result Value Ref Range   Glucose-Capillary 287 (H) 70 - 99 mg/dL    Comment: Glucose reference range applies only to samples taken after fasting for at least 8 hours.   CT ABDOMEN PELVIS W CONTRAST  Result Date: 06/14/2023 CLINICAL DATA:  Abdominal pain. Like glucose equal 407. Not filling well. EXAM: CT ABDOMEN AND PELVIS WITH CONTRAST TECHNIQUE: Multidetector CT imaging of the abdomen and pelvis was performed using the standard protocol following bolus administration of intravenous contrast. RADIATION DOSE REDUCTION: This exam was performed according to the departmental dose-optimization program which includes automated exposure control, adjustment of the mA and/or kV according to patient size and/or use of iterative reconstruction technique. CONTRAST:  OMNIPAQUE IOHEXOL 300 MG/ML  SOLN COMPARISON:  None Available. FINDINGS: Lower chest: Lung bases are clear. Hepatobiliary: No focal hepatic lesion. Normal gallbladder. No biliary duct dilatation. Common bile duct is normal. Pancreas: Pancreas is normal. No ductal dilatation. No pancreatic  inflammation. Spleen: Normal spleen Adrenals/urinary tract: Adrenal glands and kidneys are normal. Ureters are mildly distended. The bladder is significantly distended. The bladder is distended out of the pelvis to level the umbilicus measuring 17.5 x 13.7 x 12.2 cm (volume = 1530 cm^3). Stomach/Bowel: Small hiatal hernia. Stomach, small bowel, appendix, and cecum are normal. The colon and rectosigmoid colon are normal. Vascular/Lymphatic: Abdominal  aorta is normal caliber. No periportal or retroperitoneal adenopathy. No pelvic adenopathy. Reproductive: Post hysterectomy.  Adnexa unremarkable Other: No free fluid. Musculoskeletal: No aggressive osseous lesion. IMPRESSION: 1. Significantly distended bladder with volume of 1530 cc. 2. Mild distention of the ureters. 3. No renal calculi. 4. Normal appendix. 5. Small hiatal hernia. 6. No bowel obstruction or inflammation. Electronically Signed   By: Genevive Bi M.D.   On: 06/14/2023 13:02    Pending Labs Unresulted Labs (From admission, onward)     Start     Ordered   06/14/23 1535  TSH  Add-on,   AD        06/14/23 1534            Vitals/Pain Today's Vitals   06/14/23 1130 06/14/23 1256 06/14/23 1321 06/14/23 1415  BP: (!) 190/116 (!) 137/91  (!) 154/97  Pulse: (!) 117 (!) 110  100  Resp: 18 20    Temp:   98 F (36.7 C)   TempSrc:   Oral   SpO2: 99% 98%  98%  Weight:      Height:      PainSc:        Isolation Precautions No active isolations  Medications Medications  promethazine (PHENERGAN) 12.5 mg in sodium chloride 0.9 % 50 mL IVPB (has no administration in time range)  aspirin EC tablet 81 mg (has no administration in time range)  atorvastatin (LIPITOR) tablet 80 mg (has no administration in time range)  midodrine (PROAMATINE) tablet 5 mg (has no administration in time range)  ranolazine (RANEXA) 12 hr tablet 500 mg (has no administration in time range)  levothyroxine (SYNTHROID) tablet 137 mcg (has no administration in  time range)  insulin glargine (1 Unit Dial) (TOUJEO) Solostar Pen SOPN 8 Units (has no administration in time range)  metoCLOPramide (REGLAN) tablet 5 mg (has no administration in time range)  pantoprazole (PROTONIX) EC tablet 40 mg (has no administration in time range)  insulin aspart (novoLOG) injection 0-15 Units (has no administration in time range)  insulin aspart (novoLOG) injection 0-5 Units (has no administration in time range)  enoxaparin (LOVENOX) injection 40 mg (has no administration in time range)  0.9 %  sodium chloride infusion (has no administration in time range)  acetaminophen (TYLENOL) tablet 650 mg (has no administration in time range)    Or  acetaminophen (TYLENOL) suppository 650 mg (has no administration in time range)  traZODone (DESYREL) tablet 25 mg (has no administration in time range)  metoCLOPramide (REGLAN) injection 10 mg (has no administration in time range)  albuterol (PROVENTIL) (2.5 MG/3ML) 0.083% nebulizer solution 2.5 mg (has no administration in time range)  sodium chloride 0.9 % bolus 1,000 mL (0 mLs Intravenous Stopped 06/14/23 1322)  metoCLOPramide (REGLAN) injection 5 mg (5 mg Intravenous Given 06/14/23 1123)  insulin aspart (novoLOG) injection 5 Units (5 Units Subcutaneous Given 06/14/23 1122)  ondansetron (ZOFRAN) injection 4 mg (4 mg Intravenous Given 06/14/23 1203)  insulin aspart (novoLOG) injection 5 Units (5 Units Intravenous Given 06/14/23 1227)  iohexol (OMNIPAQUE) 300 MG/ML solution 100 mL (100 mLs Intravenous Contrast Given 06/14/23 1232)  famotidine (PEPCID) IVPB 20 mg premix (20 mg Intravenous New Bag/Given 06/14/23 1448)    Mobility walks

## 2023-06-14 NOTE — ED Triage Notes (Signed)
Pt arrives from home via [facility] with report of pt feeling unwell. BGL 407 at home with c/o n/v and feeling of being unwell. 500cc NS and 4mg  IV zofran pta.

## 2023-06-14 NOTE — H&P (Addendum)
History and Physical  [name] [name] WUJ:[id number] DOB: 01/09/1961 DOA: 06/14/2023  PCP: Everardo All, NP   Chief Complaint: Vomiting  HPI: [name] [name] is a 62 y.o. female with medical history significant for poorly controlled type 1 diabetes, medication noncompliance, gastroparesis and prior hospitalization due to intractable nausea and vomiting, as well as diabetic ketoacidosis being admitted to the hospital with intractable nausea and vomiting.  Patient states that she is compliant with her home diabetic medication, and that her blood sugar is usually well-controlled.  However, starting yesterday, she has been having nausea and vomiting, unable to keep anything down since yesterday, denies fevers, chills, diarrhea, significant abdominal pain.  Says that she noticed this morning that her blood sugar was almost 400, so she decided to come to the emergency department for evaluation.  ED Course: Here in the emergency department, she has been afebrile, blood pressure has been as high as 196/114, she has been saturating well on room air.  Lab work today demonstrates blood sugar 422, sodium 133, normal renal function and electrolytes, LFTs also normal.  CBC significant for WBC 13.4, and hemoglobin of 16.  In the emergency department, she was given multiple rounds of antiemetics, but continues to be symptomatic with nausea and vomiting, unable to keep anything down.  Of note, she has a history of urinary retention, has refused Foley catheter placement in the past, presented today with over 1500 cc of urine in her bladder which has now been evacuated with In-N-Out catheterization.  Review of Systems: Please see HPI for pertinent positives and negatives. A complete 10 system review of systems are otherwise negative.  Past Medical History:  Diagnosis Date   Allergy    seasonal   Anxiety    B12 deficiency    Chest pain 04/05/2017   CHF (congestive heart failure) ([name])    Diabetes mellitus type 1,  uncontrolled    "dr. 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) 10/29/2013   Leg cramping    "@ night" (09/18/2013)   Migraine    "once q couple months" (11/26/2018)   Osteoporosis    Peripheral neuropathy    Umbilical hernia    unrepaired (09/18/2013)   Past Surgical History:  Procedure Laterality Date   BIOPSY  06/03/2019   Procedure: BIOPSY;  Surgeon: Lynann Bologna, MD;  Location: [facility];  Service: Endoscopy;;   BIOPSY  08/17/2020   Procedure: BIOPSY;  Surgeon: Tressia Danas, MD;  Location: [facility];  Service: Gastroenterology;;   BIOPSY  02/05/2023   Procedure: BIOPSY;  Surgeon: Meryl Dare, MD;  Location: [facility];  Service: Gastroenterology;;   CESAREAN SECTION  1989   ENTEROSCOPY N/A 06/03/2019   Procedure: ENTEROSCOPY;  Surgeon: Lynann Bologna, MD;  Location: [facility];  Service: Endoscopy;  Laterality: N/A;   ESOPHAGOGASTRODUODENOSCOPY (EGD) WITH PROPOFOL N/A 08/17/2020   Procedure: ESOPHAGOGASTRODUODENOSCOPY (EGD) WITH PROPOFOL;  Surgeon: Tressia Danas, MD;  Location: [facility];  Service: Gastroenterology;  Laterality: N/A;   ESOPHAGOGASTRODUODENOSCOPY (EGD) WITH PROPOFOL N/A 02/05/2023   Procedure: ESOPHAGOGASTRODUODENOSCOPY (EGD) WITH PROPOFOL;  Surgeon: Meryl Dare, MD;  Location: [facility];  Service: Gastroenterology;  Laterality: N/A;   LAPAROSCOPIC ASSISTED VAGINAL HYSTERECTOMY  09/23/2012   Procedure: LAPAROSCOPIC  ASSISTED VAGINAL HYSTERECTOMY;  Surgeon: Mitchel Honour, DO;  Location: [facility];  Service: Gynecology;  Laterality: N/A;  pull Dr Vance Gather instrument   LEFT HEART CATH AND CORONARY ANGIOGRAPHY N/A 02/11/2017   Procedure: Left Heart Cath and Coronary Angiography;  Surgeon:  Runell Gess, MD;  Location: [facility] INVASIVE CV LAB;  Service: Cardiovascular;  Laterality: N/A;   TUBAL LIGATION Bilateral 1992   Social History:  reports that she quit smoking about 32 years ago. Her smoking use included cigarettes. She has a 1.20 pack-year smoking history. She has never used smokeless tobacco.  She admits to smoking marijuana "occasionally ", but states that she does not smoke every day.  She reports that she does not drink alcohol.   Allergies  Allergen Reactions   Codeine Hives, Anxiety and Other (See Comments)    Family History  Problem Relation Age of Onset   Diabetes Other    Heart disease Other    Heart failure Mother 34       Her heart skipped a beat and stopped - per pt   Diabetes Maternal Grandmother    Alzheimer's disease Maternal Grandmother    Coronary artery disease Maternal Grandmother    Heart attack Maternal Grandfather    Heart failure Brother    Diabetes Brother    Colon polyps Neg Hx    Esophageal cancer Neg Hx    Liver cancer Neg Hx    Pancreatic cancer Neg Hx    Stomach cancer Neg Hx    Crohn's disease Neg Hx    Rectal cancer Neg Hx      Prior to Admission medications   Medication Sig Start Date End Date Taking? Authorizing Provider  pantoprazole (PROTONIX) 40 MG tablet Take 1 tablet (40 mg total) by mouth 2 (two) times daily. Patient taking differently: Take 40 mg by mouth 3 (three) times daily before meals. 02/06/23 06/14/23 Yes Albertine Grates, MD  aspirin EC 81 MG tablet Take 81 mg by mouth daily. Swallow whole.    [provider]  atorvastatin (LIPITOR) 80 MG tablet Take 1 tablet (80 mg total) by mouth daily. 01/12/22   Bedsole, Amy E, MD  Continuous Blood Gluc Sensor (DEXCOM G6 SENSOR) MISC INJECT 1 SENSOR INTO SKIN EVERY 10 DAYS Patient taking differently: Inject 1 Device into the skin See admin instructions. Place 1 new device into the skin every 10 days 07/18/22   Excell Seltzer, MD  Continuous Blood Gluc Transmit (DEXCOM G6  TRANSMITTER) MISC USE AS DIRECTED FOR CONTINUOUS GLUCOSE MONITORING. REUSE TRANSMITTER X 90DAYS THEN DISCARD & REPLACE 07/05/21   Bedsole, Amy E, MD  Insulin Pen Needle (PEN NEEDLES) 31G X 8 MM MISC Use to inject insulin 4 times a day.  Dx: E11.10 07/21/21   Excell Seltzer, MD  Lancets (ONETOUCH ULTRASOFT) lancets Use to check blood sugar daily as needed.  Dx: E11.10 12/23/17   Excell Seltzer, MD  levothyroxine (SYNTHROID) 137 MCG tablet Take 137 mcg by mouth daily before breakfast. 03/19/22   [provider]  metoCLOPramide (REGLAN) 5 MG tablet Take 1 tablet (5 mg total) by mouth 3 (three) times daily before meals. Patient taking differently: Take 5 mg by mouth 2 (two) times daily before a meal. 02/06/23   Albertine Grates, MD  midodrine (PROAMATINE) 5 MG tablet Take 1 tablet (5 mg total) by mouth 2 (two) times daily with a meal. Hold if your systolic blood pressure  ( top number) is > , please check your blood  pressure at home once a day and bring in record for your doctor to review, follow up with your pcp and cardiology regarding your blood pressure medication Patient taking differently: Take 5 mg by mouth See admin instructions. Take 5 mg by mouth two times a day with meals and hold if the systolic blood pressure (top number) is > 140 mmhg. Additionally, "please check your blood pressure at home once a day and bring in record for your doctor to review. Follow up with your pcp and cardiology regarding your blood pressure medication." 02/06/23   Albertine Grates, MD  NOVOLOG FLEXPEN 100 UNIT/ML FlexPen Inject 4 Units into the skin See admin instructions. Inject 4 units into the skin three times a day with meals PLUS sliding scale coverage    [provider]  ranolazine (RANEXA) 500 MG 12 hr tablet Take 500 mg by mouth 2 (two) times daily. 05/14/23   [provider]  TOUJEO SOLOSTAR 300 UNIT/ML Solostar Pen Inject 6 Units into the skin at bedtime. Patient taking differently: Inject 8 Units  into the skin at bedtime. 03/29/22   Regalado, Prentiss Bells, MD    Physical Exam: BP (!) 154/97   Pulse 100   Temp 98 F (36.7 C) (Oral)   Resp 20   Ht 4\' 11"  (1.499 m)   Wt 52.2 kg   LMP 08/11/2012   SpO2 98%   BMI 23.23 kg/m   General:  Alert, oriented, calm, in no acute distress, looks disheveled and smells strongly of marijuana.  Resting comfortably on room air, holding emesis bag. Eyes: EOMI, clear conjuctivae, white sclerea Neck: supple, no masses, trachea mildline  Cardiovascular: RRR, no murmurs or rubs, no peripheral edema  Respiratory: clear to auscultation bilaterally, no wheezes, no crackles  Abdomen: soft, nontender, nondistended, normal bowel tones heard  Skin: dry, no rashes  Musculoskeletal: no joint effusions, normal range of motion  Psychiatric: appropriate affect, normal speech  Neurologic: extraocular muscles intact, clear speech, moving all extremities with intact sensorium          Labs on Admission:  Basic Metabolic Panel: Recent Labs  Lab 06/14/23 1008  NA 133*  K 4.3  CL 97*  CO2 23  GLUCOSE 400*  BUN 21*  CREATININE 0.59  CALCIUM 8.9   Liver Function Tests: Recent Labs  Lab 06/14/23 1008  AST 24  ALT 15  ALKPHOS 79  BILITOT 1.0  PROT 8.3*  ALBUMIN 4.4   Recent Labs  Lab 06/14/23 1008  LIPASE 63*   No results for input(s): "AMMONIA" in the last 168 hours. CBC: Recent Labs  Lab 06/14/23 1008  WBC 13.4*  NEUTROABS 10.6*  HGB 16.1*  HCT 48.1*  MCV 86.4  PLT 372   Cardiac Enzymes: No results for input(s): "CKTOTAL", "CKMB", "CKMBINDEX", "TROPONINI" in the last 168 hours.  BNP (last 3 results) No results for input(s): "BNP" in the last 8760 hours.  ProBNP (last 3 results) No results for input(s): "PROBNP" in the last 8760 hours.  CBG: Recent Labs  Lab 06/14/23 0921 06/14/23 1215 06/14/23 1338  GLUCAP 391* 422* 287*    Radiological Exams on Admission: CT ABDOMEN PELVIS W CONTRAST  Result Date: 06/14/2023 CLINICAL  DATA:  Abdominal pain. Like glucose equal 407. Not filling well. EXAM: CT ABDOMEN AND PELVIS WITH CONTRAST TECHNIQUE: Multidetector CT imaging of the abdomen and pelvis was performed using the standard protocol following bolus administration of intravenous contrast. RADIATION DOSE REDUCTION: This exam was performed according to the departmental dose-optimization  program which includes automated exposure control, adjustment of the mA and/or kV according to patient size and/or use of iterative reconstruction technique. CONTRAST:  OMNIPAQUE IOHEXOL 300 MG/ML  SOLN COMPARISON:  None Available. FINDINGS: Lower chest: Lung bases are clear. Hepatobiliary: No focal hepatic lesion. Normal gallbladder. No biliary duct dilatation. Common bile duct is normal. Pancreas: Pancreas is normal. No ductal dilatation. No pancreatic inflammation. Spleen: Normal spleen Adrenals/urinary tract: Adrenal glands and kidneys are normal. Ureters are mildly distended. The bladder is significantly distended. The bladder is distended out of the pelvis to level the umbilicus measuring 17.5 x 13.7 x 12.2 cm (volume = 1530 cm^3). Stomach/Bowel: Small hiatal hernia. Stomach, small bowel, appendix, and cecum are normal. The colon and rectosigmoid colon are normal. Vascular/Lymphatic: Abdominal aorta is normal caliber. No periportal or retroperitoneal adenopathy. No pelvic adenopathy. Reproductive: Post hysterectomy.  Adnexa unremarkable Other: No free fluid. Musculoskeletal: No aggressive osseous lesion. IMPRESSION: 1. Significantly distended bladder with volume of 1530 cc. 2. Mild distention of the ureters. 3. No renal calculi. 4. Normal appendix. 5. Small hiatal hernia. 6. No bowel obstruction or inflammation. Electronically Signed   By: Genevive Bi M.D.   On: 06/14/2023 13:02    Assessment/Plan This is a 62 year old female with a history of poorly controlled type 1 diabetes, medication noncompliance, hypothyroidism, autonomic  dysfunction, gastroparesis, urinary retention being admitted to the hospital with intractable nausea and vomiting.  Intractable nausea and vomiting-this seems to be an exacerbation of her known gastroparesis, but the strong smell of marijuana as well as positive UDS for THC raises the question of possible cannabinoid hyperemesis playing a role in this presentation. -Observation admission -Clear liquid diet as tolerated -IV fluids -IV Reglan every 6 hours as needed nausea  Poorly controlled insulin-dependent type 1 diabetes -Diabetic diet has advanced -Continue home glargine 8 units at bedtime -Moderate dose sliding scale insulin  Urinary retention-this is a known problem, she has refused urology recommendation for Foley catheter.  She presented with greater than 1500 cc of urine in her bladder. -Continue intermittent catheterization  Hyperlipidemia-Lipitor 80 mg p.o. daily  Autonomic dysfunction-continue home midodrine 5 mg p.o. twice daily, hold for SBP greater than 140  Hypothyroidism-patient noted to be noncompliant with her Synthroid -Continue home Synthroid 137 mcg p.o. daily -Recheck TSH  Microvascular ischemia and CAD-continue home Ranexa, aspirin  GERD-Continue Protonix 40 mg p.o. twice daily  DVT prophylaxis: Lovenox     Code Status: Full Code  Consults called: None  Admission status: Observation  Time spent: 49 minutes  Marcheta Horsey Sharlette Dense MD [facility] Pager [phone]  If 7PM-7AM, please contact night-coverage [email or web address] Password [email or web address]  06/14/2023, 3:24 PM

## 2023-06-14 NOTE — ED Provider Notes (Signed)
[place] EMERGENCY DEPARTMENT AT [facility] Provider Note   CSN: [id number] Arrival date & time: 06/14/23  4098     History  Chief Complaint  Patient presents with   Hyperglycemia   Emesis    [name] [name] is a 62 y.o. female.  HPI   62 year old diabetic female with history of gastroparesis and previous admission secondary to intractable nausea/vomiting presents emergency department with feeling unwell.  Patient states that she woke up this morning with nausea/vomiting and generalized fatigue.  She denies any abdominal pain or diarrhea.  Patient states that she has been compliant with her home medications.  Felt well yesterday.  Denies any recent fever or illness.  Home Medications Prior to Admission medications   Medication Sig Start Date End Date Taking? Authorizing Provider  aspirin EC 81 MG tablet Take 81 mg by mouth daily. Swallow whole.    [provider]  atorvastatin (LIPITOR) 80 MG tablet Take 1 tablet (80 mg total) by mouth daily. 01/12/22   Bedsole, Amy E, MD  Continuous Blood Gluc Sensor (DEXCOM G6 SENSOR) MISC INJECT 1 SENSOR INTO SKIN EVERY 10 DAYS Patient taking differently: Inject 1 Device into the skin See admin instructions. Place 1 new device into the skin every 10 days 07/18/22   Excell Seltzer, MD  Continuous Blood Gluc Transmit (DEXCOM G6 TRANSMITTER) MISC USE AS DIRECTED FOR CONTINUOUS GLUCOSE MONITORING. REUSE TRANSMITTER X 90DAYS THEN DISCARD & REPLACE 07/05/21   Bedsole, Amy E, MD  Insulin Pen Needle (PEN NEEDLES) 31G X 8 MM MISC Use to inject insulin 4 times a day.  Dx: E11.10 07/21/21   Excell Seltzer, MD  Lancets (ONETOUCH ULTRASOFT) lancets Use to check blood sugar daily as needed.  Dx: E11.10 12/23/17   Excell Seltzer, MD  levothyroxine (SYNTHROID) 137 MCG tablet Take 137 mcg by mouth daily before breakfast. 03/19/22   [provider]  metoCLOPramide (REGLAN) 5 MG tablet Take 1 tablet (5 mg total) by mouth 3 (three) times daily  before meals. Patient taking differently: Take 5 mg by mouth 2 (two) times daily before a meal. 02/06/23   Albertine Grates, MD  midodrine (PROAMATINE) 5 MG tablet Take 1 tablet (5 mg total) by mouth 2 (two) times daily with a meal. Hold if your systolic blood pressure  ( top number) is > , please check your blood pressure at home once a day and bring in record for your doctor to review, follow up with your pcp and cardiology regarding your blood pressure medication Patient taking differently: Take 5 mg by mouth See admin instructions. Take 5 mg by mouth two times a day with meals and hold if the systolic blood pressure (top number) is > 140 mmhg. Additionally, "please check your blood pressure at home once a day and bring in record for your doctor to review. Follow up with your pcp and cardiology regarding your blood pressure medication." 02/06/23   Albertine Grates, MD  NOVOLOG FLEXPEN 100 UNIT/ML FlexPen Inject 4 Units into the skin See admin instructions. Inject 4 units into the skin three times a day with meals PLUS sliding scale coverage    [provider]  pantoprazole (PROTONIX) 40 MG tablet Take 1 tablet (40 mg total) by mouth 2 (two) times daily. Patient taking differently: Take 40 mg by mouth 3 (three) times daily before meals. 02/06/23 05/14/23  Albertine Grates, MD  ranolazine (RANEXA) 500 MG 12 hr tablet Take 500 mg by mouth 2 (two) times daily.  05/14/23   [provider]  TOUJEO SOLOSTAR 300 UNIT/ML Solostar Pen Inject 6 Units into the skin at bedtime. Patient taking differently: Inject 8 Units into the skin at bedtime. 03/29/22   Regalado, Jon Billings A, MD      Allergies    Codeine    Review of Systems   Review of Systems  Constitutional:  Positive for appetite change and fatigue. Negative for fever.  Respiratory:  Negative for shortness of breath.   Cardiovascular:  Negative for chest pain.  Gastrointestinal:  Positive for nausea and vomiting. Negative for abdominal pain, constipation and  diarrhea.  Genitourinary:  Negative for dysuria and flank pain.  Skin:  Negative for rash.  Neurological:  Negative for headaches.    Physical Exam Updated Vital Signs BP (!) 196/114 (BP Location: Left Arm)   Pulse 100   Temp 97.9 F (36.6 C) (Oral)   Resp 14   Ht 4\' 11"  (1.499 m)   Wt 52.2 kg   LMP 08/11/2012   SpO2 99%   BMI 23.23 kg/m  Physical Exam Vitals and nursing note reviewed.  Constitutional:      General: She is not in acute distress.    Appearance: Normal appearance. She is ill-appearing.  HENT:     Head: Normocephalic.     Mouth/Throat:     Mouth: Mucous membranes are moist.  Cardiovascular:     Rate and Rhythm: Normal rate.  Pulmonary:     Effort: Pulmonary effort is normal. No respiratory distress.  Abdominal:     Palpations: Abdomen is soft.     Tenderness: There is no abdominal tenderness.     Comments: Active vomiting, nonbloody  Skin:    General: Skin is warm.  Neurological:     Mental Status: She is alert and oriented to person, place, and time. Mental status is at baseline.  Psychiatric:        Mood and Affect: Mood normal.     ED Results / Procedures / Treatments   Labs (all labs ordered are listed, but only abnormal results are displayed) Labs Reviewed  CBC WITH DIFFERENTIAL/PLATELET - Abnormal; Notable for the following components:      Result Value   WBC 13.4 (*)    RBC 5.57 (*)    Hemoglobin 16.1 (*)    HCT 48.1 (*)    Neutro Abs 10.6 (*)    Abs Immature Granulocytes 0.08 (*)    All other components within normal limits  COMPREHENSIVE METABOLIC PANEL - Abnormal; Notable for the following components:   Sodium 133 (*)    Chloride 97 (*)    Glucose, Bld 400 (*)    BUN 21 (*)    Total Protein 8.3 (*)    All other components within normal limits  LIPASE, BLOOD - Abnormal; Notable for the following components:   Lipase 63 (*)    All other components within normal limits  BLOOD GAS, VENOUS - Abnormal; Notable for the following  components:   pH, Ven 7.47 (*)    pCO2, Ven 35 (*)    pO2, Ven 174 (*)    Acid-Base Excess 2.1 (*)    All other components within normal limits  BETA-HYDROXYBUTYRIC ACID - Abnormal; Notable for the following components:   Beta-Hydroxybutyric Acid 1.50 (*)    All other components within normal limits  CBG MONITORING, ED - Abnormal; Notable for the following components:   Glucose-Capillary 391 (*)    All other components within normal limits  URINALYSIS, ROUTINE  W REFLEX MICROSCOPIC    EKG None  Radiology No results found.  Procedures Procedures    Medications Ordered in ED Medications  sodium chloride 0.9 % bolus 1,000 mL (1,000 mLs Intravenous New Bag/Given 06/14/23 1122)  metoCLOPramide (REGLAN) injection 5 mg (5 mg Intravenous Given 06/14/23 1123)  insulin aspart (novoLOG) injection 5 Units (5 Units Subcutaneous Given 06/14/23 1122)    ED Course/ Medical Decision Making/ A&P                             Medical Decision Making Amount and/or Complexity of Data Reviewed Labs: ordered. Radiology: ordered.  Risk Prescription drug management.   62 year old female presents emergency department ongoing nausea/vomiting and hyperglycemia.  History of gastroparesis and previous admissions in regards to this.  On arrival patient is actively vomiting, afebrile, diffusely tender abdomen but nondistended.  Blood work shows mild abnormalities.  She is hyperglycemic without evidence of DKA/HHS.  She has mild leukocytosis, chronically elevated lipase.  Urinalysis shows no UTI.  Patient was hydrated, given nausea medicine and insulin.  She continues to be tachycardic with ongoing nausea, despite 4 rounds of IV antinausea medicine.  Blood sugar is improving.  CT of the abdomen pelvis identifies no acute abnormality besides a very large bladder.  Patient states that she has history of urinary retention, supposed to be intermittently self cathing but does not like to because of the discomfort.   Patient was allowed to try to urinate but her postvoid bladder scan was close 2000.  In-N-Out ordered, over a liter drained.  Of note sometimes the room has the smell of wheat, UDS has been ordered.  Patient continues to have nausea/vomiting and inability to drink.  Will plan for inpatient admission and treatment.  Patients evaluation and results requires admission for further treatment and care.  Spoke with hospitalist, reviewed patient's ED course and they accept admission.  Patient agrees with admission plan, offers no new complaints and is stable/unchanged at time of admit.        Final Clinical Impression(s) / ED Diagnoses Final diagnoses:  None    Rx / DC Orders ED Discharge Orders     None         Rozelle Logan, DO 06/14/23 1453

## 2023-06-15 DIAGNOSIS — E785 Hyperlipidemia, unspecified: Secondary | ICD-10-CM | POA: Diagnosis present

## 2023-06-15 DIAGNOSIS — E1065 Type 1 diabetes mellitus with hyperglycemia: Secondary | ICD-10-CM | POA: Diagnosis present

## 2023-06-15 DIAGNOSIS — Z79890 Hormone replacement therapy (postmenopausal): Secondary | ICD-10-CM | POA: Diagnosis not present

## 2023-06-15 DIAGNOSIS — Z794 Long term (current) use of insulin: Secondary | ICD-10-CM | POA: Diagnosis not present

## 2023-06-15 DIAGNOSIS — Z87891 Personal history of nicotine dependence: Secondary | ICD-10-CM | POA: Diagnosis not present

## 2023-06-15 DIAGNOSIS — K219 Gastro-esophageal reflux disease without esophagitis: Secondary | ICD-10-CM | POA: Diagnosis present

## 2023-06-15 DIAGNOSIS — R112 Nausea with vomiting, unspecified: Secondary | ICD-10-CM | POA: Diagnosis not present

## 2023-06-15 DIAGNOSIS — E1043 Type 1 diabetes mellitus with diabetic autonomic (poly)neuropathy: Secondary | ICD-10-CM | POA: Diagnosis present

## 2023-06-15 DIAGNOSIS — Z91148 Patient's other noncompliance with medication regimen for other reason: Secondary | ICD-10-CM | POA: Diagnosis not present

## 2023-06-15 DIAGNOSIS — Y92009 Unspecified place in unspecified non-institutional (private) residence as the place of occurrence of the external cause: Secondary | ICD-10-CM | POA: Diagnosis not present

## 2023-06-15 DIAGNOSIS — T381X6A Underdosing of thyroid hormones and substitutes, initial encounter: Secondary | ICD-10-CM | POA: Diagnosis present

## 2023-06-15 DIAGNOSIS — F1290 Cannabis use, unspecified, uncomplicated: Secondary | ICD-10-CM | POA: Diagnosis present

## 2023-06-15 DIAGNOSIS — D72829 Elevated white blood cell count, unspecified: Secondary | ICD-10-CM | POA: Diagnosis present

## 2023-06-15 DIAGNOSIS — Z91128 Patient's intentional underdosing of medication regimen for other reason: Secondary | ICD-10-CM | POA: Diagnosis not present

## 2023-06-15 DIAGNOSIS — Z8249 Family history of ischemic heart disease and other diseases of the circulatory system: Secondary | ICD-10-CM | POA: Diagnosis not present

## 2023-06-15 DIAGNOSIS — Z91119 Patient's noncompliance with dietary regimen due to unspecified reason: Secondary | ICD-10-CM | POA: Diagnosis not present

## 2023-06-15 DIAGNOSIS — R339 Retention of urine, unspecified: Secondary | ICD-10-CM | POA: Diagnosis present

## 2023-06-15 DIAGNOSIS — Z79899 Other long term (current) drug therapy: Secondary | ICD-10-CM | POA: Diagnosis not present

## 2023-06-15 DIAGNOSIS — N182 Chronic kidney disease, stage 2 (mild): Secondary | ICD-10-CM | POA: Diagnosis present

## 2023-06-15 DIAGNOSIS — I129 Hypertensive chronic kidney disease with stage 1 through stage 4 chronic kidney disease, or unspecified chronic kidney disease: Secondary | ICD-10-CM | POA: Diagnosis present

## 2023-06-15 DIAGNOSIS — E1022 Type 1 diabetes mellitus with diabetic chronic kidney disease: Secondary | ICD-10-CM | POA: Diagnosis present

## 2023-06-15 DIAGNOSIS — I2510 Atherosclerotic heart disease of native coronary artery without angina pectoris: Secondary | ICD-10-CM | POA: Diagnosis present

## 2023-06-15 DIAGNOSIS — E1042 Type 1 diabetes mellitus with diabetic polyneuropathy: Secondary | ICD-10-CM | POA: Diagnosis present

## 2023-06-15 DIAGNOSIS — Z5329 Procedure and treatment not carried out because of patient's decision for other reasons: Secondary | ICD-10-CM | POA: Diagnosis present

## 2023-06-15 DIAGNOSIS — K3184 Gastroparesis: Secondary | ICD-10-CM | POA: Diagnosis present

## 2023-06-15 DIAGNOSIS — E039 Hypothyroidism, unspecified: Secondary | ICD-10-CM | POA: Diagnosis present

## 2023-06-15 LAB — GLUCOSE, CAPILLARY
Glucose-Capillary: 290 mg/dL — ABNORMAL HIGH (ref 70–99)
Glucose-Capillary: 116 mg/dL — ABNORMAL HIGH (ref 70–99)
Glucose-Capillary: 152 mg/dL — ABNORMAL HIGH (ref 70–99)
Glucose-Capillary: 187 mg/dL — ABNORMAL HIGH (ref 70–99)

## 2023-06-15 LAB — BASIC METABOLIC PANEL
Anion gap: 7 (ref 5–15)
BUN: 11 mg/dL (ref 6–20)
CO2: 26 mmol/L (ref 22–32)
Calcium: 8.4 mg/dL — ABNORMAL LOW (ref 8.9–10.3)
Chloride: 100 mmol/L (ref 98–111)
Creatinine, Ser: 0.48 mg/dL (ref 0.44–1.00)
GFR, Estimated: >60 mL/min (ref >60)
Glucose, Bld: 122 mg/dL — ABNORMAL HIGH (ref 70–99)
Potassium: 2.7 mmol/L — CL (ref 3.5–5.1)
Sodium: 133 mmol/L — ABNORMAL LOW (ref 135–145)

## 2023-06-15 LAB — MAGNESIUM: Magnesium: 1.8 mg/dL (ref 1.7–2.4)

## 2023-06-15 MED ORDER — POTASSIUM CHLORIDE 10 MEQ/100ML IV SOLN
10.0000 meq | INTRAVENOUS | Status: AC
  Administered 2023-06-15 (×4): 10 meq via INTRAVENOUS
  Filled 2023-06-15: qty 100

## 2023-06-15 MED ORDER — POTASSIUM CHLORIDE 20 MEQ PO PACK
40.0000 meq | PACK | Freq: Once | ORAL | Status: AC
  Administered 2023-06-15: 40 meq via ORAL
  Filled 2023-06-15: qty 2

## 2023-06-15 NOTE — Progress Notes (Signed)
PROGRESS NOTE  [name] [name] WJX:[id number] DOB: [birth year] DOA: 06/14/2023 PCP: Everardo All, NP   LOS: 0 days   Brief Narrative / Interim history: 62 year old female with poorly controlled diabetes, medication nonadherence, gastroparesis comes into the hospital with recurrent nausea, vomiting.  This has been going on for the past day or 2.  Most recent A1c is 13, and when asked about diabetes he tells me there is been a lot of stressors and has not been watching what she eats, and her sugar usually runs in the higher 200s  Subjective / 24h Interval events: Persistent nausea this morning.  Does not feel ready to eat anything  Assesement and Plan: Active Problems:   Intractable vomiting with nausea   Principal problem Intractable nausea and vomiting -likely in the setting of gastroparesis, poorly controlled diabetes mellitus, ongoing marijuana use. -Patient reports a lot of stressors, her husband is sick and really has not been take care of herself or taking her medications as she should -Discussed about the importance of compliance -Continue IV fluids, continue Reglan, antiemetics.  Leave on clear liquids for now, if she does better advance diet as tolerated  Active problems Urinary retention -apparently unknown problem, refused Foley catheter.  Status post In-N-Out in the ER.  Continue to monitor  Hyperlipidemia-continue statin  Autonomic dysfunction-continue midodrine  Hypothyroidism-TSH elevated, she reports not taking Synthroid every day as prescribed.  Given that, I will not change the dose right now, encouraged adherence  Microvascular ischemia, CAD-continue Ranexa, aspirin  Poorly controlled type 1 diabetes, with hyperglycemia -continue insulin, most recent A1c in the 13 range.  Scheduled Meds:  aspirin EC  81 mg Oral Daily   atorvastatin  80 mg Oral Daily   enoxaparin (LOVENOX) injection  40 mg Subcutaneous Q24H   insulin aspart  0-15 Units Subcutaneous TID WC    insulin aspart  0-5 Units Subcutaneous QHS   insulin glargine-yfgn  8 Units Subcutaneous QHS   levothyroxine  137 mcg Oral Q0600   metoCLOPramide  5 mg Oral TID AC   midodrine  5 mg Oral BID WC   pantoprazole  40 mg Oral BID   ranolazine  500 mg Oral BID   Continuous Infusions:  sodium chloride Stopped (06/15/23 0439)   promethazine (PHENERGAN) injection (IM or IVPB) 12.5 mg (06/15/23 1030)   PRN Meds:.acetaminophen **OR** acetaminophen, albuterol, metoCLOPramide (REGLAN) injection, promethazine (PHENERGAN) injection (IM or IVPB), traZODone  Current Outpatient Medications  Medication Instructions   aspirin EC 81 mg, Oral, Daily, Swallow whole.   atorvastatin (LIPITOR) 80 mg, Oral, Daily   Continuous Blood Gluc Sensor (DEXCOM G6 SENSOR) MISC INJECT 1 SENSOR INTO SKIN EVERY 10 DAYS   Continuous Blood Gluc Transmit (DEXCOM G6 TRANSMITTER) MISC USE AS DIRECTED FOR CONTINUOUS GLUCOSE MONITORING. REUSE TRANSMITTER X 90DAYS THEN DISCARD & REPLACE   Insulin Pen Needle (PEN NEEDLES) 31G X 8 MM MISC Use to inject insulin 4 times a day.  Dx: E11.10   Lancets (ONETOUCH ULTRASOFT) lancets Use to check blood sugar daily as needed.  Dx: E11.10   levothyroxine (SYNTHROID) 137 mcg, Oral, Daily before breakfast   metoCLOPramide (REGLAN) 5 mg, Oral, 3 times daily before meals   midodrine (PROAMATINE) 5 mg, Oral, 2 times daily with meals, Hold if your systolic blood pressure  ( top number) is > , please check your blood pressure at home once a day and bring in record for your doctor to review, follow up with your pcp and cardiology regarding your blood pressure  medication   NovoLOG FlexPen 4 Units, Subcutaneous, See admin instructions, Inject 4 units into the skin three times a day after meals, PLUS sliding scale coverage   pantoprazole (PROTONIX) 40 mg, Oral, 2 times daily   Toujeo SoloStar 6 Units, Subcutaneous, Daily at bedtime   zolpidem (AMBIEN) 2.5 mg, Oral, At bedtime PRN    Diet Orders  (From admission, onward)     Start     Ordered   06/14/23 1522  Diet clear liquid Room service appropriate? Yes; Fluid consistency: Thin  Diet effective now       Question Answer Comment  Room service appropriate? Yes   Fluid consistency: Thin      06/14/23 1522            DVT prophylaxis: enoxaparin (LOVENOX) injection 40 mg Start: 06/14/23 1800 SCDs Start: 06/14/23 1522   Lab Results  Component Value Date   PLT 372 06/14/2023      Code Status: Full Code  Family Communication: no family at bedside   Status is: Observation The patient will require care spanning > 2 midnights and should be moved to inpatient because: Persistent symptoms   Level of care: Med-Surg  Consultants:  none  Objective: Vitals:   06/14/23 1614 06/14/23 1954 06/15/23 0010 06/15/23 0346  BP: (!) 169/107 119/78 (!) 147/83 134/80  Pulse: (!) 105 99 84 93  Resp: 20 18 18 18   Temp: 98 F (36.7 C) 98.9 F (37.2 C) 97.6 F (36.4 C) 98.8 F (37.1 C)  TempSrc: Oral Oral Oral Oral  SpO2: 100% 99% 96% 99%  Weight:      Height:        Intake/Output Summary (Last 24 hours) at 06/15/2023 1058 Last data filed at 06/15/2023 0444 Gross per 24 hour  Intake 2463.71 ml  Output 1100 ml  Net 1363.71 ml   Wt Readings from Last 3 Encounters:  06/14/23 52.2 kg  05/16/23 55.4 kg  04/19/23 52.2 kg    Examination:  Constitutional: NAD Eyes: no scleral icterus ENMT: Mucous membranes are moist.  Neck: normal, supple Respiratory: clear to auscultation bilaterally, no wheezing, no crackles. Normal respiratory effort. No accessory muscle use.  Cardiovascular: Regular rate and rhythm, no murmurs / rubs / gallops. No LE edema.  Abdomen: non distended, no tenderness. Bowel sounds positive.  Musculoskeletal: no clubbing / cyanosis.   Data Reviewed: I have independently reviewed following labs and imaging studies   CBC Recent Labs  Lab 06/14/23 1008  WBC 13.4*  HGB 16.1*  HCT 48.1*  PLT 372  MCV  86.4  MCH 28.9  MCHC 33.5  RDW 14.8  LYMPHSABS 2.1  MONOABS 0.5  EOSABS 0.1  BASOSABS 0.1    Recent Labs  Lab 06/14/23 1008 06/14/23 1846  NA 133*  --   K 4.3  --   CL 97*  --   CO2 23  --   GLUCOSE 400*  --   BUN 21*  --   CREATININE 0.59  --   CALCIUM 8.9  --   AST 24  --   ALT 15  --   ALKPHOS 79  --   BILITOT 1.0  --   ALBUMIN 4.4  --   TSH  --  26.970*    ------------------------------------------------------------------------------------------------------------------ No results for input(s): "CHOL", "HDL", "LDLCALC", "TRIG", "CHOLHDL", "LDLDIRECT" in the last 72 hours.  Lab Results  Component Value Date   HGBA1C 13.8 (H) 05/14/2023   ------------------------------------------------------------------------------------------------------------------ Recent Labs    06/14/23 1846  TSH 26.970*    Cardiac Enzymes No results for input(s): "CKMB", "TROPONINI", "MYOGLOBIN" in the last 168 hours.  Invalid input(s): "CK" ------------------------------------------------------------------------------------------------------------------    Component Value Date/Time   BNP 14.9 06/30/2021 1239    CBG: Recent Labs  Lab 06/14/23 1215 06/14/23 1338 06/14/23 1650 06/14/23 2104 06/15/23 0724  GLUCAP 422* 287* 249* 203* 290*    No results found for this or any previous visit (from the past 240 hour(s)).   Radiology Studies: CT ABDOMEN PELVIS W CONTRAST  Result Date: 06/14/2023 CLINICAL DATA:  Abdominal pain. Like glucose equal 407. Not filling well. EXAM: CT ABDOMEN AND PELVIS WITH CONTRAST TECHNIQUE: Multidetector CT imaging of the abdomen and pelvis was performed using the standard protocol following bolus administration of intravenous contrast. RADIATION DOSE REDUCTION: This exam was performed according to the departmental dose-optimization program which includes automated exposure control, adjustment of the mA and/or kV according to patient size and/or use of  iterative reconstruction technique. CONTRAST:  OMNIPAQUE IOHEXOL 300 MG/ML  SOLN COMPARISON:  None Available. FINDINGS: Lower chest: Lung bases are clear. Hepatobiliary: No focal hepatic lesion. Normal gallbladder. No biliary duct dilatation. Common bile duct is normal. Pancreas: Pancreas is normal. No ductal dilatation. No pancreatic inflammation. Spleen: Normal spleen Adrenals/urinary tract: Adrenal glands and kidneys are normal. Ureters are mildly distended. The bladder is significantly distended. The bladder is distended out of the pelvis to level the umbilicus measuring 17.5 x 13.7 x 12.2 cm (volume = 1530 cm^3). Stomach/Bowel: Small hiatal hernia. Stomach, small bowel, appendix, and cecum are normal. The colon and rectosigmoid colon are normal. Vascular/Lymphatic: Abdominal aorta is normal caliber. No periportal or retroperitoneal adenopathy. No pelvic adenopathy. Reproductive: Post hysterectomy.  Adnexa unremarkable Other: No free fluid. Musculoskeletal: No aggressive osseous lesion. IMPRESSION: 1. Significantly distended bladder with volume of 1530 cc. 2. Mild distention of the ureters. 3. No renal calculi. 4. Normal appendix. 5. Small hiatal hernia. 6. No bowel obstruction or inflammation. Electronically Signed   By: Genevive Bi M.D.   On: 06/14/2023 13:02     Pamella Pert, MD, PhD [facility]  Between 7 am - 7 pm I am available, please contact me via Amion (for emergencies) or Securechat (non urgent messages)  Between 7 pm - 7 am I am not available, please contact night coverage MD/APP via Amion

## 2023-06-15 NOTE — Plan of Care (Signed)

## 2023-06-16 ENCOUNTER — Encounter (HOSPITAL_COMMUNITY): Payer: Self-pay | Admitting: Internal Medicine

## 2023-06-16 DIAGNOSIS — R112 Nausea with vomiting, unspecified: Secondary | ICD-10-CM | POA: Diagnosis not present

## 2023-06-16 LAB — CBC
HCT: 47.4 % — ABNORMAL HIGH (ref 36.0–46.0)
Hemoglobin: 15.5 g/dL — ABNORMAL HIGH (ref 12.0–15.0)
MCH: 28.9 pg (ref 26.0–34.0)
MCHC: 32.7 g/dL (ref 30.0–36.0)
MCV: 88.3 fL (ref 80.0–100.0)
Platelets: 352 10*3/uL (ref 150–400)
RBC: 5.37 MIL/uL — ABNORMAL HIGH (ref 3.87–5.11)
RDW: 14.9 % (ref 11.5–15.5)
WBC: 12 10*3/uL — ABNORMAL HIGH (ref 4.0–10.5)
nRBC: 0 % (ref 0.0–0.2)

## 2023-06-16 LAB — GLUCOSE, CAPILLARY
Glucose-Capillary: 222 mg/dL — ABNORMAL HIGH (ref 70–99)
Glucose-Capillary: 175 mg/dL — ABNORMAL HIGH (ref 70–99)
Glucose-Capillary: 181 mg/dL — ABNORMAL HIGH (ref 70–99)
Glucose-Capillary: 157 mg/dL — ABNORMAL HIGH (ref 70–99)

## 2023-06-16 LAB — COMPREHENSIVE METABOLIC PANEL
ALT: 15 U/L (ref 0–44)
AST: 16 U/L (ref 15–41)
Albumin: 4.1 g/dL (ref 3.5–5.0)
Alkaline Phosphatase: 68 U/L (ref 38–126)
Anion gap: 10 (ref 5–15)
BUN: 9 mg/dL (ref 6–20)
CO2: 22 mmol/L (ref 22–32)
Calcium: 8.8 mg/dL — ABNORMAL LOW (ref 8.9–10.3)
Chloride: 100 mmol/L (ref 98–111)
Creatinine, Ser: 0.6 mg/dL (ref 0.44–1.00)
GFR, Estimated: >60 mL/min (ref >60)
Glucose, Bld: 224 mg/dL — ABNORMAL HIGH (ref 70–99)
Potassium: 4.4 mmol/L (ref 3.5–5.1)
Sodium: 132 mmol/L — ABNORMAL LOW (ref 135–145)
Total Bilirubin: 1 mg/dL (ref 0.3–1.2)
Total Protein: 7.6 g/dL (ref 6.5–8.1)

## 2023-06-16 LAB — MAGNESIUM: Magnesium: 2 mg/dL (ref 1.7–2.4)

## 2023-06-16 NOTE — Progress Notes (Signed)
PROGRESS NOTE  [name] [name] YNW:[id number] DOB: [birth year] DOA: 06/14/2023 PCP: Everardo All, NP   LOS: 1 day   Brief Narrative / Interim history: 62 year old female with poorly controlled diabetes, medication nonadherence, gastroparesis comes into the hospital with recurrent nausea, vomiting.  This has been going on for the past day or 2.  Most recent A1c is 13, and when asked about diabetes he tells me there is been a lot of stressors and has not been watching what she eats, and her sugar usually runs in the higher 200s  Subjective / 24h Interval events: Nausea slightly better.  Wants to try more food  Assesement and Plan: Principal Problem:   Gastroparesis Active Problems:   Intractable vomiting with nausea   Principal problem Intractable nausea and vomiting -likely in the setting of gastroparesis, poorly controlled diabetes mellitus, ongoing marijuana use. -Patient reports a lot of stressors, her husband is sick and really has not been take care of herself or taking her medications as she should -Discussed about the importance of compliance -Eating better, slowly advance diet, stop IV fluids  Active problems Urinary retention -apparently unknown problem, refused Foley catheter.  Status post In-N-Out in the ER.  Continue to monitor  Hyperlipidemia-continue statin  Autonomic dysfunction-continue midodrine  Hypothyroidism-TSH elevated, she reports not taking Synthroid every day as prescribed.  Given that, I will not change the dose right now, encouraged adherence  Microvascular ischemia, CAD-continue Ranexa, aspirin  Poorly controlled type 1 diabetes, with hyperglycemia -continue insulin, most recent A1c in the 13 range.  Social -she is having significant difficulties with her husband, he just told her this morning that she should not return home.  He is going to call her kids to see if she can go live with them for a while.  TOC consulted  Scheduled Meds:  aspirin EC   81 mg Oral Daily   atorvastatin  80 mg Oral Daily   enoxaparin (LOVENOX) injection  40 mg Subcutaneous Q24H   insulin aspart  0-15 Units Subcutaneous TID WC   insulin aspart  0-5 Units Subcutaneous QHS   insulin glargine-yfgn  8 Units Subcutaneous QHS   levothyroxine  137 mcg Oral Q0600   metoCLOPramide  5 mg Oral TID AC   midodrine  5 mg Oral BID WC   pantoprazole  40 mg Oral BID   ranolazine  500 mg Oral BID   Continuous Infusions:  sodium chloride 100 mL/hr at 06/16/23 0800   promethazine (PHENERGAN) injection (IM or IVPB) Stopped (06/15/23 1744)   PRN Meds:.acetaminophen **OR** acetaminophen, albuterol, metoCLOPramide (REGLAN) injection, promethazine (PHENERGAN) injection (IM or IVPB), traZODone  Current Outpatient Medications  Medication Instructions   aspirin EC 81 mg, Oral, Daily, Swallow whole.   atorvastatin (LIPITOR) 80 mg, Oral, Daily   Continuous Blood Gluc Sensor (DEXCOM G6 SENSOR) MISC INJECT 1 SENSOR INTO SKIN EVERY 10 DAYS   Continuous Blood Gluc Transmit (DEXCOM G6 TRANSMITTER) MISC USE AS DIRECTED FOR CONTINUOUS GLUCOSE MONITORING. REUSE TRANSMITTER X 90DAYS THEN DISCARD & REPLACE   Insulin Pen Needle (PEN NEEDLES) 31G X 8 MM MISC Use to inject insulin 4 times a day.  Dx: E11.10   Lancets (ONETOUCH ULTRASOFT) lancets Use to check blood sugar daily as needed.  Dx: E11.10   levothyroxine (SYNTHROID) 137 mcg, Oral, Daily before breakfast   metoCLOPramide (REGLAN) 5 mg, Oral, 3 times daily before meals   midodrine (PROAMATINE) 5 mg, Oral, 2 times daily with meals, Hold if your systolic blood pressure  (  top number) is > , please check your blood pressure at home once a day and bring in record for your doctor to review, follow up with your pcp and cardiology regarding your blood pressure medication   NovoLOG FlexPen 4 Units, Subcutaneous, See admin instructions, Inject 4 units into the skin three times a day after meals, PLUS sliding scale coverage   pantoprazole  (PROTONIX) 40 mg, Oral, 2 times daily   Toujeo SoloStar 6 Units, Subcutaneous, Daily at bedtime   zolpidem (AMBIEN) 2.5 mg, Oral, At bedtime PRN    Diet Orders (From admission, onward)     Start     Ordered   06/16/23 1018  DIET SOFT Room service appropriate? Yes; Fluid consistency: Thin  Diet effective now       Question Answer Comment  Room service appropriate? Yes   Fluid consistency: Thin      06/16/23 1017            DVT prophylaxis: enoxaparin (LOVENOX) injection 40 mg Start: 06/14/23 1800 SCDs Start: 06/14/23 1522   Lab Results  Component Value Date   PLT 352 06/16/2023      Code Status: Full Code  Family Communication: no family at bedside   Status is: Inpatient   Level of care: Med-Surg  Consultants:  none  Objective: Vitals:   06/15/23 0346 06/15/23 2244 06/16/23 0436 06/16/23 0758  BP: 134/80 (!) 126/97 127/89 (!) 153/83  Pulse: 93 100 93 95  Resp: 18 16 16 16   Temp: 98.8 F (37.1 C) 98.7 F (37.1 C) 98.3 F (36.8 C) 98.8 F (37.1 C)  TempSrc: Oral Oral Oral Oral  SpO2: 99% 99% 99% 100%  Weight:      Height:        Intake/Output Summary (Last 24 hours) at 06/16/2023 1017 Last data filed at 06/16/2023 0354 Gross per 24 hour  Intake 729.71 ml  Output --  Net 729.71 ml    Wt Readings from Last 3 Encounters:  06/14/23 52.2 kg  05/16/23 55.4 kg  04/19/23 52.2 kg    Examination:  Constitutional: NAD Eyes: lids and conjunctivae normal, no scleral icterus ENMT: mmm Neck: normal, supple Respiratory: clear to auscultation bilaterally, no wheezing, no crackles. Normal respiratory effort.  Cardiovascular: Regular rate and rhythm, no murmurs / rubs / gallops. No LE edema. Abdomen: soft, no distention, no tenderness. Bowel sounds positive.  Skin: no rashes  Data Reviewed: I have independently reviewed following labs and imaging studies   CBC Recent Labs  Lab 06/14/23 1008 06/16/23 0431  WBC 13.4* 12.0*  HGB 16.1* 15.5*  HCT 48.1*  47.4*  PLT 372 352  MCV 86.4 88.3  MCH 28.9 28.9  MCHC 33.5 32.7  RDW 14.8 14.9  LYMPHSABS 2.1  --   MONOABS 0.5  --   EOSABS 0.1  --   BASOSABS 0.1  --      Recent Labs  Lab 06/14/23 1008 06/14/23 1846 06/15/23 1121 06/16/23 0431  NA 133*  --  133* 132*  K 4.3  --  2.7* 4.4  CL 97*  --  100 100  CO2 23  --  26 22  GLUCOSE 400*  --  122* 224*  BUN 21*  --  11 9  CREATININE 0.59  --  0.48 0.60  CALCIUM 8.9  --  8.4* 8.8*  AST 24  --   --  16  ALT 15  --   --  15  ALKPHOS 79  --   --  68  BILITOT 1.0  --   --  1.0  ALBUMIN 4.4  --   --  4.1  MG  --   --  1.8 2.0  TSH  --  26.970*  --   --      ------------------------------------------------------------------------------------------------------------------ No results for input(s): "CHOL", "HDL", "LDLCALC", "TRIG", "CHOLHDL", "LDLDIRECT" in the last 72 hours.  Lab Results  Component Value Date   HGBA1C 13.8 (H) 05/14/2023   ------------------------------------------------------------------------------------------------------------------ Recent Labs    06/14/23 1846  TSH 26.970*     Cardiac Enzymes No results for input(s): "CKMB", "TROPONINI", "MYOGLOBIN" in the last 168 hours.  Invalid input(s): "CK" ------------------------------------------------------------------------------------------------------------------    Component Value Date/Time   BNP 14.9 06/30/2021 1239    CBG: Recent Labs  Lab 06/15/23 0724 06/15/23 1131 06/15/23 1624 06/15/23 2116 06/16/23 0733  GLUCAP 290* 116* 152* 187* 222*     No results found for this or any previous visit (from the past 240 hour(s)).   Radiology Studies: No results found.   Pamella Pert, MD, PhD [facility]  Between 7 am - 7 pm I am available, please contact me via Amion (for emergencies) or Securechat (non urgent messages)  Between 7 pm - 7 am I am not available, please contact night coverage MD/APP via Amion

## 2023-06-16 NOTE — TOC Initial Note (Signed)
Transition of Care [facility]) - Initial/Assessment Note    Patient Details  Name: [name] [name] MRN: [id number] Date of Birth: Jul 21, 1961  [facility] CM/SW Contact:    [name] Prows, RN Phone Number: 06/16/2023, 1:26 PM  Clinical Narrative:                 [facility] consult for d/c planning; notified  pt "fighting a lot with her husband who just told her this morning to "not come home anymore".She will talk with her kids and see if she can stay with them for a little while. She also wants outpatient psychiatric resources"; spoke w/ pt in room; pt says she lives w/ husband but he told her she could come back home; pt says she has transportation; pt identified POC husband [name] [name] [phone]); she denies food insecurity and difficulty paying utilities; pt is not sure of her d/c location; pt verifies she has PCP and insurance; pt has glasses and Rolator; there are grab bars in shower; pt says she does not have HH services or home oxygen; pt says she smokes marijuana; pt says she would like a psychiatrist because her brother died 3 years ago; pt given copies of resources for [employer], [employer] of the [place], and Guide to shelters; list of possible psychiatrist placed in follow up provider section of d/c instructions; she will make appts of agencies of choice; pt also advised to contact her insurance company for [employer], and discuss finding psychiatrist w/ PCP; she verbalized understanding; also secure chat sent to Dr Elvera Lennox requesting OP psych; TOC will follow.  Expected Discharge Plan: Home/Self Care Barriers to Discharge: Continued Medical Work up   Patient Goals and CMS Choice Patient states their goals for this hospitalization and ongoing recovery are:: not sure of d/c plan          Expected Discharge Plan and Services   Discharge Planning Services: CM Consult   Living arrangements for the past 2 months: Single Family Home                                       Prior Living Arrangements/Services Living arrangements for the past 2 months: Single Family Home Lives with:: Spouse Patient language and need for interpreter reviewed:: Yes Do you feel safe going back to the place where you live?: No   fighting a lot with her husband who just told her this morning to "not come home anymore".  Need for Family Participation in Patient Care: Yes (Comment) Care giver support system in place?: Yes (comment) (patient has children) Current home services: D[employer]) Criminal Activity/Legal Involvement Pertinent to Current Situation/Hospitalization: No - Comment as needed  Activities of Daily Living Home Assistive Devices/Equipment: None ADL Screening (condition at time of admission) Patient's cognitive ability adequate to safely complete daily activities?: Yes Is the patient deaf or have difficulty hearing?: No Does the patient have difficulty seeing, even when wearing glasses/contacts?: No Does the patient have difficulty concentrating, remembering, or making decisions?: No Patient able to express need for assistance with ADLs?: Yes Does the patient have difficulty dressing or bathing?: No Independently performs ADLs?: Yes (appropriate for developmental age) Does the patient have difficulty walking or climbing stairs?: No Weakness of Legs: None Weakness of Arms/Hands: None  Permission Sought/Granted Permission sought to share information with : Case Manager Permission granted to share information with : Yes,  Verbal Permission Granted  Share Information with NAME: Case Manager     Permission granted to share info w Relationship: [name] [name] (spouse) [phone]     Emotional Assessment Appearance:: Appears stated age Attitude/Demeanor/Rapport: Gracious Affect (typically observed): Accepting Orientation: : Oriented to Self, Oriented to Place, Oriented to  Time, Oriented to Situation Alcohol / Substance Use:  Illicit Drugs Psych Involvement: No (comment)  Admission diagnosis:  Urinary retention [R33.9] Hyperglycemia [R73.9] Intractable vomiting with nausea [R11.2] Nausea and vomiting, unspecified vomiting type [R11.2] Gastroparesis [K31.84] Patient Active Problem List   Diagnosis Date Noted   Intractable vomiting with nausea 06/14/2023   Orthostatic hypotension 05/14/2023   non obstructive CAD (coronary artery disease) 05/14/2023   Urinary retention 05/14/2023   Uncontrolled type 1 diabetes mellitus with hyperglycemia, with long-term current use of insulin ([name]) 05/14/2023   UTI (urinary tract infection) 05/14/2023   Abnormal CT scan, esophagus 02/05/2023   Acute urinary retention 01/07/2023   Gastroparesis 01/07/2023   Chronic pain of both knees 08/20/2022   Intractable nausea and vomiting 03/27/2022   Marijuana abuse 03/27/2022   Bladder wall thickening 03/09/2021   Chronic orthostatic hypotension 03/06/2021   Gastric erosion    Urinary frequency 02/10/2020   Chronic diastolic HF (heart failure) ([name]) 10/12/2019   Duodenal ulcer without hemorrhage or perforation    Constipation    Nausea and vomiting 03/25/2019   MDD (major depressive disorder), recurrent episode, moderate ([name]) 07/01/2018   Dehydration 05/26/2018   Chronic chest pain    Noncompliance 01/14/2018   Migraines 12/11/2017   Phrygian cap 06/20/2017   NSTEMI (non-ST elevated myocardial infarction) ([name]) 02/11/2017   Paroxysmal A-fib ([name]) 02/01/2015   Kidney disease, chronic, 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 [place] [address] Phone: [phone] Fax:  [phone]     Social Determinants of Health (SDOH) Social History: SDOH Screenings   Food Insecurity: No Food Insecurity (06/16/2023)  Housing: Patient Declined (06/16/2023)  Transportation Needs: No Transportation Needs (06/16/2023)  Utilities: Not At Risk (06/16/2023)  Depression (PHQ2-9): High Risk (01/24/2022)  Tobacco Use: Medium Risk (06/16/2023)   SDOH Interventions: Food Insecurity Interventions: Intervention Not Indicated, Inpatient TOC Housing Interventions: Intervention Not Indicated, Inpatient TOC Transportation Interventions: Intervention Not Indicated, Inpatient TOC Utilities Interventions: Intervention Not Indicated, Inpatient TOC   Readmission Risk Interventions     No data to display

## 2023-06-17 DIAGNOSIS — K3184 Gastroparesis: Secondary | ICD-10-CM | POA: Diagnosis not present

## 2023-06-17 LAB — GLUCOSE, CAPILLARY: Glucose-Capillary: 248 mg/dL — ABNORMAL HIGH (ref 70–99)

## 2023-06-17 NOTE — Plan of Care (Signed)
Pt alert and oriented x 4. Med compliant. Denies pain. Vitals stable. Denies n/v  Problem: Education: Goal: Knowledge of General Education information will improve Description: Including pain rating scale, medication(s)/side effects and non-pharmacologic comfort measures Outcome: Progressing   Problem: Health Behavior/Discharge Planning: Goal: Ability to manage health-related needs will improve Outcome: Progressing   Problem: Clinical Measurements: Goal: Ability to maintain clinical measurements within normal limits will improve Outcome: Progressing Goal: Will remain free from infection Outcome: Progressing Goal: Diagnostic test results will improve Outcome: Progressing Goal: Respiratory complications will improve Outcome: Progressing Goal: Cardiovascular complication will be avoided Outcome: Progressing   Problem: Activity: Goal: Risk for activity intolerance will decrease Outcome: Progressing   Problem: Nutrition: Goal: Adequate nutrition will be maintained Outcome: Progressing   Problem: Coping: Goal: Level of anxiety will decrease Outcome: Progressing   Problem: Elimination: Goal: Will not experience complications related to bowel motility Outcome: Progressing Goal: Will not experience complications related to urinary retention Outcome: Progressing   Problem: Pain Managment: Goal: General experience of comfort will improve Outcome: Progressing   Problem: Safety: Goal: Ability to remain free from injury will improve Outcome: Progressing   Problem: Skin Integrity: Goal: Risk for impaired skin integrity will decrease Outcome: Progressing

## 2023-06-17 NOTE — Discharge Summary (Signed)
Physician Discharge Summary  [name] [name]:[id number] DOB: September 06, 1961 DOA: 06/14/2023  PCP: Everardo All, NP  Admit date: 06/14/2023 Discharge date: 06/17/2023  Admitted From: home Disposition:  home  Recommendations for Outpatient Follow-up:  Follow up with PCP in 1-2 weeks  Home Health: none Equipment/Devices: none  Discharge Condition: stable CODE STATUS: Full code  HPI: Per admitting MD,  [name] [name] is a 62 y.o. female with medical history significant for poorly controlled type 1 diabetes, medication noncompliance, gastroparesis and prior hospitalization due to intractable nausea and vomiting, as well as diabetic ketoacidosis being admitted to the hospital with intractable nausea and vomiting.  Patient states that she is compliant with her home diabetic medication, and that her blood sugar is usually well-controlled.  However, starting yesterday, she has been having nausea and vomiting, unable to keep anything down since yesterday, denies fevers, chills, diarrhea, significant abdominal pain.  Says that she noticed this morning that her blood sugar was almost 400, so she decided to come to the emergency department for evaluation.   Hospital Course / Discharge diagnoses: Principal Problem:   Gastroparesis Active Problems:   Intractable vomiting with nausea   Principal problem Intractable nausea and vomiting -likely in the setting of gastroparesis, poorly controlled diabetes mellitus, ongoing marijuana use.  She was treated conservatively with n.p.o., IV fluids with significant improvement in her symptoms.  Slowly her diet was advanced, she is now tolerating a regular diet and will be discharged home in stable condition. Patient reports a lot of stressors, her husband is sick and really has not been take care of herself or taking her medications as she should. Discussed about the importance of compliance   Active problems Urinary retention -apparently unknown problem,  refused Foley catheter.  Status post In-N-Out in the ER.  Urinating well on her own currently.  Outpatient follow-up with urology Hyperlipidemia-continue statin Autonomic dysfunction-continue midodrine Hypothyroidism-TSH elevated, she reports not taking Synthroid every day as prescribed.  Given that, I will not change the dose right now, encouraged adherence Microvascular ischemia, CAD-continue Ranexa, aspirin Poorly controlled type 1 diabetes, with hyperglycemia -continue insulin, most recent A1c in the 13 range.  Reports noncompliance with medications and diet   Sepsis ruled out   Discharge Instructions  Discharge Instructions     Ambulatory referral to Psychiatry   Complete by: As directed       Allergies as of 06/17/2023       Reactions   Codeine Hives, Anxiety, Other (See Comments)        Medication List     TAKE these medications    aspirin EC 81 MG tablet Take 81 mg by mouth daily. Swallow whole.   atorvastatin 80 MG tablet Commonly known as: LIPITOR Take 1 tablet (80 mg total) by mouth daily.   Dexcom G6 Sensor Misc INJECT 1 SENSOR INTO SKIN EVERY 10 DAYS What changed: See the new instructions.   Dexcom G6 Transmitter Misc USE AS DIRECTED FOR CONTINUOUS GLUCOSE MONITORING. REUSE TRANSMITTER X 90DAYS THEN DISCARD & REPLACE   levothyroxine 137 MCG tablet Commonly known as: SYNTHROID Take 137 mcg by mouth daily before breakfast.   metoCLOPramide 5 MG tablet Commonly known as: REGLAN Take 1 tablet (5 mg total) by mouth 3 (three) times daily before meals. What changed:  when to take this reasons to take this   midodrine 5 MG tablet Commonly known as: PROAMATINE Take 1 tablet (5 mg total) by mouth 2 (two) times daily with a meal.  Hold if your systolic blood pressure  ( top number) is > , please check your blood pressure at home once a day and bring in record for your doctor to review, follow up with your pcp and cardiology regarding your blood  pressure medication What changed:  when to take this additional instructions   NovoLOG FlexPen 100 UNIT/ML FlexPen Generic drug: insulin aspart Inject 4 Units into the skin See admin instructions. Inject 4 units into the skin three times a day after meals, PLUS sliding scale coverage   onetouch ultrasoft lancets Use to check blood sugar daily as needed.  Dx: E11.10   pantoprazole 40 MG tablet Commonly known as: Protonix Take 1 tablet (40 mg total) by mouth 2 (two) times daily.   Pen Needles 31G X 8 MM Misc Use to inject insulin 4 times a day.  Dx: E11.10   Toujeo SoloStar 300 UNIT/ML Solostar Pen Generic drug: insulin glargine (1 Unit Dial) Inject 6 Units into the skin at bedtime. What changed: how much to take   zolpidem 5 MG tablet Commonly known as: AMBIEN Take 2.5 mg by mouth at bedtime as needed for sleep.        Follow-up Information     [facility] Follow up.   Contact information: [address] [place] [address] [phone]         Center, [facility] .   Specialty: [facility] information: [address] [place] [address] [phone]         [facility] .   Contact information: [address] [place] [address]        [facility] [facility] .   Contact information: [address] [place] [address] [phone]                 Consultations: none  Procedures/Studies:  CT ABDOMEN PELVIS W CONTRAST  Result Date: 06/14/2023 CLINICAL DATA:  Abdominal pain. Like glucose equal 407. Not filling well. EXAM: CT ABDOMEN AND PELVIS WITH CONTRAST TECHNIQUE: Multidetector CT imaging of the abdomen and pelvis was performed using the standard protocol following bolus administration of intravenous contrast. RADIATION DOSE REDUCTION: This exam was  performed according to the departmental dose-optimization program which includes automated exposure control, adjustment of the mA and/or kV according to patient size and/or use of iterative reconstruction technique. CONTRAST:  OMNIPAQUE IOHEXOL 300 MG/ML  SOLN COMPARISON:  None Available. FINDINGS: Lower chest: Lung bases are clear. Hepatobiliary: No focal hepatic lesion. Normal gallbladder. No biliary duct dilatation. Common bile duct is normal. Pancreas: Pancreas is normal. No ductal dilatation. No pancreatic inflammation. Spleen: Normal spleen Adrenals/urinary tract: Adrenal glands and kidneys are normal. Ureters are mildly distended. The bladder is significantly distended. The bladder is distended out of the pelvis to level the umbilicus measuring 17.5 x 13.7 x 12.2 cm (volume = 1530 cm^3). Stomach/Bowel: Small hiatal hernia. Stomach, small bowel, appendix, and cecum are normal. The colon and rectosigmoid colon are normal. Vascular/Lymphatic: Abdominal aorta is normal caliber. No periportal or retroperitoneal adenopathy. No pelvic adenopathy. Reproductive: Post hysterectomy.  Adnexa unremarkable Other: No free fluid. Musculoskeletal: No aggressive osseous lesion. IMPRESSION: 1. Significantly distended bladder with volume of 1530 cc. 2. Mild distention of the ureters. 3. No renal calculi. 4. Normal appendix. 5. Small hiatal hernia. 6. No bowel obstruction or inflammation. Electronically Signed   By: Genevive Bi M.D.   On:  06/14/2023 13:02     Subjective: - no chest pain, shortness of breath, no abdominal pain, nausea or vomiting.   Discharge Exam: BP 127/89 (BP Location: Right Arm)   Pulse 90   Temp 98.5 F (36.9 C)   Resp 18   Ht 4\' 11"  (1.499 m)   Wt 52.2 kg   LMP 08/11/2012   SpO2 99%   BMI 23.23 kg/m   General: Pt is alert, awake, not in acute distress Cardiovascular: RRR, S1/S2 +, no rubs, no gallops Respiratory: CTA bilaterally, no wheezing, no rhonchi Abdominal: Soft, NT,  ND, bowel sounds + Extremities: no edema, no cyanosis    The results of significant diagnostics from this hospitalization (including imaging, microbiology, ancillary and laboratory) are listed below for reference.     Microbiology: No results found for this or any previous visit (from the past 240 hour(s)).   Labs: Basic Metabolic Panel: Recent Labs  Lab 06/14/23 1008 06/15/23 1121 06/16/23 0431  NA 133* 133* 132*  K 4.3 2.7* 4.4  CL 97* 100 100  CO2 23 26 22   GLUCOSE 400* 122* 224*  BUN 21* 11 9  CREATININE 0.59 0.48 0.60  CALCIUM 8.9 8.4* 8.8*  MG  --  1.8 2.0   Liver Function Tests: Recent Labs  Lab 06/14/23 1008 06/16/23 0431  AST 24 16  ALT 15 15  ALKPHOS 79 68  BILITOT 1.0 1.0  PROT 8.3* 7.6  ALBUMIN 4.4 4.1   CBC: Recent Labs  Lab 06/14/23 1008 06/16/23 0431  WBC 13.4* 12.0*  NEUTROABS 10.6*  --   HGB 16.1* 15.5*  HCT 48.1* 47.4*  MCV 86.4 88.3  PLT 372 352   CBG: Recent Labs  Lab 06/15/23 2116 06/16/23 0733 06/16/23 1135 06/16/23 1633 06/16/23 2117  GLUCAP 187* 222* 175* 181* 157*   Hgb A1c No results for input(s): "HGBA1C" in the last 72 hours. Lipid Profile No results for input(s): "CHOL", "HDL", "LDLCALC", "TRIG", "CHOLHDL", "LDLDIRECT" in the last 72 hours. Thyroid function studies Recent Labs    06/14/23 1846  TSH 26.970*   Urinalysis    Component Value Date/Time   COLORURINE YELLOW 06/14/2023 1154   APPEARANCEUR CLOUDY (A) 06/14/2023 1154   LABSPEC 1.022 06/14/2023 1154   PHURINE 7.0 06/14/2023 1154   GLUCOSEU >=500 (A) 06/14/2023 1154   HGBUR SMALL (A) 06/14/2023 1154   BILIRUBINUR NEGATIVE 06/14/2023 1154   BILIRUBINUR negative 03/09/2021 1123   KETONESUR 20 (A) 06/14/2023 1154   PROTEINUR 30 (A) 06/14/2023 1154   UROBILINOGEN 0.2 03/09/2021 1123   UROBILINOGEN 0.2 10/28/2013 1723   NITRITE NEGATIVE 06/14/2023 1154   LEUKOCYTESUR NEGATIVE 06/14/2023 1154    FURTHER DISCHARGE INSTRUCTIONS:   Get Medicines  reviewed and adjusted: Please take all your medications with you for your next visit with your Primary MD   Laboratory/radiological data: Please request your Primary MD to go over all hospital tests and procedure/radiological results at the follow up, please ask your Primary MD to get all Hospital records sent to his/her office.   In some cases, they will be blood work, cultures and biopsy results pending at the time of your discharge. Please request that your primary care M.D. goes through all the records of your hospital data and follows up on these results.   Also Note the following: If you experience worsening of your admission symptoms, develop shortness of breath, life threatening emergency, suicidal or homicidal thoughts you must seek medical attention immediately by calling 911 or calling your MD immediately  if symptoms less severe.   You must read complete instructions/literature along with all the possible adverse reactions/side effects for all the Medicines you take and that have been prescribed to you. Take any new Medicines after you have completely understood and accpet all the possible adverse reactions/side effects.    Do not drive when taking Pain medications or sleeping medications (Benzodaizepines)   Do not take more than prescribed Pain, Sleep and Anxiety Medications. It is not advisable to combine anxiety,sleep and pain medications without talking with your primary care practitioner   Special Instructions: If you have smoked or chewed Tobacco  in the last 2 yrs please stop smoking, stop any regular Alcohol  and or any Recreational drug use.   Wear Seat belts while driving.   Please note: You were cared for by a hospitalist during your hospital stay. Once you are discharged, your primary care physician will handle any further medical issues. Please note that NO REFILLS for any discharge medications will be authorized once you are discharged, as it is imperative that you  return to your primary care physician (or establish a relationship with a primary care physician if you do not have one) for your post hospital discharge needs so that they can reassess your need for medications and monitor your lab values.  Time coordinating discharge: 35 minutes  SIGNED:  Pamella Pert, MD, PhD 06/17/2023, 7:45 AM

## 2023-08-01 ENCOUNTER — Inpatient Hospital Stay (HOSPITAL_COMMUNITY)
Admission: EM | Admit: 2023-08-01 | Discharge: 2023-08-02 | DRG: 639 | Disposition: A | Discharge status: Home / Self Care | Payer: BC Managed Care – PPO | Attending: Infectious Diseases | Admitting: Infectious Diseases

## 2023-08-01 ENCOUNTER — Encounter (HOSPITAL_COMMUNITY): Payer: Self-pay

## 2023-08-01 ENCOUNTER — Emergency Department (HOSPITAL_COMMUNITY): Payer: BC Managed Care – PPO

## 2023-08-01 ENCOUNTER — Other Ambulatory Visit: Payer: Self-pay

## 2023-08-01 DIAGNOSIS — Z794 Long term (current) use of insulin: Secondary | ICD-10-CM

## 2023-08-01 DIAGNOSIS — E785 Hyperlipidemia, unspecified: Secondary | ICD-10-CM | POA: Diagnosis present

## 2023-08-01 DIAGNOSIS — K229 Disease of esophagus, unspecified: Secondary | ICD-10-CM | POA: Diagnosis not present

## 2023-08-01 DIAGNOSIS — R1084 Generalized abdominal pain: Secondary | ICD-10-CM | POA: Diagnosis not present

## 2023-08-01 DIAGNOSIS — G901 Familial dysautonomia [Riley-Day]: Secondary | ICD-10-CM | POA: Diagnosis not present

## 2023-08-01 DIAGNOSIS — Z7982 Long term (current) use of aspirin: Secondary | ICD-10-CM

## 2023-08-01 DIAGNOSIS — I10 Essential (primary) hypertension: Secondary | ICD-10-CM | POA: Diagnosis not present

## 2023-08-01 DIAGNOSIS — Z87891 Personal history of nicotine dependence: Secondary | ICD-10-CM

## 2023-08-01 DIAGNOSIS — R339 Retention of urine, unspecified: Secondary | ICD-10-CM | POA: Diagnosis present

## 2023-08-01 DIAGNOSIS — Z79890 Hormone replacement therapy (postmenopausal): Secondary | ICD-10-CM

## 2023-08-01 DIAGNOSIS — E1110 Type 2 diabetes mellitus with ketoacidosis without coma: Secondary | ICD-10-CM | POA: Diagnosis not present

## 2023-08-01 DIAGNOSIS — N3289 Other specified disorders of bladder: Secondary | ICD-10-CM | POA: Diagnosis present

## 2023-08-01 DIAGNOSIS — Z91148 Patient's other noncompliance with medication regimen for other reason: Secondary | ICD-10-CM

## 2023-08-01 DIAGNOSIS — Z885 Allergy status to narcotic agent status: Secondary | ICD-10-CM | POA: Diagnosis not present

## 2023-08-01 DIAGNOSIS — R739 Hyperglycemia, unspecified: Secondary | ICD-10-CM | POA: Diagnosis not present

## 2023-08-01 DIAGNOSIS — Z79899 Other long term (current) drug therapy: Secondary | ICD-10-CM | POA: Diagnosis not present

## 2023-08-01 DIAGNOSIS — E039 Hypothyroidism, unspecified: Secondary | ICD-10-CM | POA: Diagnosis present

## 2023-08-01 DIAGNOSIS — E1010 Type 1 diabetes mellitus with ketoacidosis without coma: Secondary | ICD-10-CM | POA: Diagnosis not present

## 2023-08-01 DIAGNOSIS — R000 Tachycardia, unspecified: Secondary | ICD-10-CM | POA: Diagnosis not present

## 2023-08-01 DIAGNOSIS — R9431 Abnormal electrocardiogram [ECG] [EKG]: Secondary | ICD-10-CM | POA: Diagnosis not present

## 2023-08-01 DIAGNOSIS — E1043 Type 1 diabetes mellitus with diabetic autonomic (poly)neuropathy: Secondary | ICD-10-CM | POA: Diagnosis present

## 2023-08-01 DIAGNOSIS — F419 Anxiety disorder, unspecified: Secondary | ICD-10-CM | POA: Diagnosis present

## 2023-08-01 DIAGNOSIS — K3184 Gastroparesis: Secondary | ICD-10-CM | POA: Diagnosis not present

## 2023-08-01 DIAGNOSIS — K828 Other specified diseases of gallbladder: Secondary | ICD-10-CM | POA: Diagnosis present

## 2023-08-01 DIAGNOSIS — K5730 Diverticulosis of large intestine without perforation or abscess without bleeding: Secondary | ICD-10-CM | POA: Diagnosis not present

## 2023-08-01 DIAGNOSIS — R109 Unspecified abdominal pain: Secondary | ICD-10-CM | POA: Diagnosis not present

## 2023-08-01 DIAGNOSIS — R112 Nausea with vomiting, unspecified: Secondary | ICD-10-CM | POA: Diagnosis not present

## 2023-08-01 LAB — I-STAT CHEM 8, ED
BUN: 24 mg/dL — ABNORMAL HIGH (ref 6–20)
Calcium, Ion: 0.92 mmol/L — ABNORMAL LOW (ref 1.15–1.40)
Chloride: 85 mmol/L — ABNORMAL LOW (ref 98–111)
Creatinine, Ser: 0.7 mg/dL (ref 0.44–1.00)
Glucose, Bld: 558 mg/dL (ref 70–99)
HCT: 44 % (ref 36.0–46.0)
Hemoglobin: 15 g/dL (ref 12.0–15.0)
Potassium: 3.5 mmol/L (ref 3.5–5.1)
Sodium: 124 mmol/L — ABNORMAL LOW (ref 135–145)
TCO2: 25 mmol/L (ref 22–32)

## 2023-08-01 LAB — I-STAT VENOUS BLOOD GAS, ED
Acid-Base Excess: 6 mmol/L — ABNORMAL HIGH (ref 0.0–2.0)
Bicarbonate: 29 mmol/L — ABNORMAL HIGH (ref 20.0–28.0)
Calcium, Ion: 0.99 mmol/L — ABNORMAL LOW (ref 1.15–1.40)
HCT: 42 % (ref 36.0–46.0)
Hemoglobin: 14.3 g/dL (ref 12.0–15.0)
O2 Saturation: 99 %
Potassium: 3.9 mmol/L (ref 3.5–5.1)
Sodium: 128 mmol/L — ABNORMAL LOW (ref 135–145)
TCO2: 30 mmol/L (ref 22–32)
pCO2, Ven: 35.1 mmHg — ABNORMAL LOW (ref 44–60)
pH, Ven: 7.525 — ABNORMAL HIGH (ref 7.25–7.43)
pO2, Ven: 106 mmHg — ABNORMAL HIGH (ref 32–45)

## 2023-08-01 LAB — URINALYSIS, ROUTINE W REFLEX MICROSCOPIC
Bilirubin Urine: NEGATIVE
Glucose, UA: >=500 mg/dL — AB
Hgb urine dipstick: NEGATIVE
Ketones, ur: 80 mg/dL — AB
Leukocytes,Ua: NEGATIVE
Nitrite: NEGATIVE
Protein, ur: 30 mg/dL — AB
Specific Gravity, Urine: 1.024 (ref 1.005–1.030)
pH: 6 (ref 5.0–8.0)

## 2023-08-01 LAB — GLUCOSE, CAPILLARY
Glucose-Capillary: 175 mg/dL — ABNORMAL HIGH (ref 70–99)
Glucose-Capillary: 155 mg/dL — ABNORMAL HIGH (ref 70–99)
Glucose-Capillary: 140 mg/dL — ABNORMAL HIGH (ref 70–99)
Glucose-Capillary: 121 mg/dL — ABNORMAL HIGH (ref 70–99)
Glucose-Capillary: 154 mg/dL — ABNORMAL HIGH (ref 70–99)
Glucose-Capillary: 147 mg/dL — ABNORMAL HIGH (ref 70–99)
Glucose-Capillary: 140 mg/dL — ABNORMAL HIGH (ref 70–99)
Glucose-Capillary: 138 mg/dL — ABNORMAL HIGH (ref 70–99)
Glucose-Capillary: 147 mg/dL — ABNORMAL HIGH (ref 70–99)
Glucose-Capillary: 145 mg/dL — ABNORMAL HIGH (ref 70–99)

## 2023-08-01 LAB — BASIC METABOLIC PANEL
Anion gap: 10 (ref 5–15)
Anion gap: 9 (ref 5–15)
BUN: 13 mg/dL (ref 6–20)
BUN: 10 mg/dL (ref 6–20)
CO2: 27 mmol/L (ref 22–32)
CO2: 25 mmol/L (ref 22–32)
Calcium: 7.8 mg/dL — ABNORMAL LOW (ref 8.9–10.3)
Calcium: 7.9 mg/dL — ABNORMAL LOW (ref 8.9–10.3)
Chloride: 96 mmol/L — ABNORMAL LOW (ref 98–111)
Chloride: 99 mmol/L (ref 98–111)
Creatinine, Ser: 0.64 mg/dL (ref 0.44–1.00)
Creatinine, Ser: 0.54 mg/dL (ref 0.44–1.00)
GFR, Estimated: >60 mL/min (ref >60)
GFR, Estimated: >60 mL/min (ref >60)
Glucose, Bld: 165 mg/dL — ABNORMAL HIGH (ref 70–99)
Glucose, Bld: 168 mg/dL — ABNORMAL HIGH (ref 70–99)
Potassium: 2.9 mmol/L — ABNORMAL LOW (ref 3.5–5.1)
Potassium: 3.3 mmol/L — ABNORMAL LOW (ref 3.5–5.1)
Sodium: 133 mmol/L — ABNORMAL LOW (ref 135–145)
Sodium: 133 mmol/L — ABNORMAL LOW (ref 135–145)

## 2023-08-01 LAB — COMPREHENSIVE METABOLIC PANEL
ALT: 15 U/L (ref 0–44)
AST: 14 U/L — ABNORMAL LOW (ref 15–41)
Albumin: 4 g/dL (ref 3.5–5.0)
Alkaline Phosphatase: 80 U/L (ref 38–126)
Anion gap: 21 — ABNORMAL HIGH (ref 5–15)
BUN: 21 mg/dL — ABNORMAL HIGH (ref 6–20)
CO2: 25 mmol/L (ref 22–32)
Calcium: 9 mg/dL (ref 8.9–10.3)
Chloride: 80 mmol/L — ABNORMAL LOW (ref 98–111)
Creatinine, Ser: 1.03 mg/dL — ABNORMAL HIGH (ref 0.44–1.00)
GFR, Estimated: >60 mL/min (ref >60)
Glucose, Bld: 555 mg/dL (ref 70–99)
Potassium: 3.6 mmol/L (ref 3.5–5.1)
Sodium: 126 mmol/L — ABNORMAL LOW (ref 135–145)
Total Bilirubin: 1.3 mg/dL — ABNORMAL HIGH (ref 0.3–1.2)
Total Protein: 7.3 g/dL (ref 6.5–8.1)

## 2023-08-01 LAB — LIPASE, BLOOD: Lipase: 391 U/L — ABNORMAL HIGH (ref 11–51)

## 2023-08-01 LAB — CBG MONITORING, ED
Glucose-Capillary: >600 mg/dL (ref 70–99)
Glucose-Capillary: 395 mg/dL — ABNORMAL HIGH (ref 70–99)
Glucose-Capillary: 289 mg/dL — ABNORMAL HIGH (ref 70–99)
Glucose-Capillary: 277 mg/dL — ABNORMAL HIGH (ref 70–99)
Glucose-Capillary: 251 mg/dL — ABNORMAL HIGH (ref 70–99)
Glucose-Capillary: 239 mg/dL — ABNORMAL HIGH (ref 70–99)

## 2023-08-01 LAB — CBC
HCT: 43.4 % (ref 36.0–46.0)
Hemoglobin: 14.8 g/dL (ref 12.0–15.0)
MCH: 28.5 pg (ref 26.0–34.0)
MCHC: 34.1 g/dL (ref 30.0–36.0)
MCV: 83.6 fL (ref 80.0–100.0)
Platelets: 305 10*3/uL (ref 150–400)
RBC: 5.19 MIL/uL — ABNORMAL HIGH (ref 3.87–5.11)
RDW: 14.4 % (ref 11.5–15.5)
WBC: 11.9 10*3/uL — ABNORMAL HIGH (ref 4.0–10.5)
nRBC: 0 % (ref 0.0–0.2)

## 2023-08-01 LAB — BETA-HYDROXYBUTYRIC ACID: Beta-Hydroxybutyric Acid: 3.98 mmol/L — ABNORMAL HIGH (ref 0.05–0.27)

## 2023-08-01 MED ORDER — SODIUM CHLORIDE 0.9 % IV BOLUS
1000.0000 mL | Freq: Once | INTRAVENOUS | Status: AC
  Administered 2023-08-01: 1000 mL via INTRAVENOUS

## 2023-08-01 MED ORDER — INSULIN ASPART 100 UNIT/ML IJ SOLN: 0.0000 [IU] | Freq: Every day | INTRAMUSCULAR | Status: DC

## 2023-08-01 MED ORDER — ASPIRIN 81 MG PO TBEC
81.0000 mg | DELAYED_RELEASE_TABLET | Freq: Every day | ORAL | Status: DC
  Filled 2023-08-01: qty 1

## 2023-08-01 MED ORDER — POTASSIUM CHLORIDE CRYS ER 20 MEQ PO TBCR: 40.0000 meq | EXTENDED_RELEASE_TABLET | Freq: Once | ORAL | Status: DC

## 2023-08-01 MED ORDER — LEVOTHYROXINE SODIUM 25 MCG PO TABS
137.0000 ug | ORAL_TABLET | Freq: Every day | ORAL | Status: DC
  Administered 2023-08-02: 137 ug via ORAL
  Filled 2023-08-01: qty 1

## 2023-08-01 MED ORDER — INSULIN GLARGINE-YFGN 100 UNIT/ML ~~LOC~~ SOLN
8.0000 [IU] | Freq: Every day | SUBCUTANEOUS | Status: DC
  Administered 2023-08-02: 8 [IU] via SUBCUTANEOUS
  Filled 2023-08-01 (×2): qty 0.08

## 2023-08-01 MED ORDER — PROMETHAZINE (PHENERGAN) 6.25MG IN NS 50ML IVPB
6.2500 mg | Freq: Four times a day (QID) | INTRAVENOUS | Status: DC | PRN
  Administered 2023-08-01 – 2023-08-02 (×3): 6.25 mg via INTRAVENOUS
  Filled 2023-08-01 (×3): qty 6.25

## 2023-08-01 MED ORDER — DEXTROSE IN LACTATED RINGERS 5 % IV SOLN: INTRAVENOUS | Status: DC

## 2023-08-01 MED ORDER — METOCLOPRAMIDE HCL 5 MG PO TABS
5.0000 mg | ORAL_TABLET | Freq: Three times a day (TID) | ORAL | Status: DC
  Administered 2023-08-02: 5 mg via ORAL
  Filled 2023-08-01: qty 1

## 2023-08-01 MED ORDER — PANTOPRAZOLE SODIUM 40 MG PO TBEC
40.0000 mg | DELAYED_RELEASE_TABLET | Freq: Two times a day (BID) | ORAL | Status: DC
  Administered 2023-08-02: 40 mg via ORAL
  Filled 2023-08-01 (×2): qty 1

## 2023-08-01 MED ORDER — ROSUVASTATIN CALCIUM 5 MG PO TABS
10.0000 mg | ORAL_TABLET | Freq: Every day | ORAL | Status: DC
  Filled 2023-08-01: qty 2

## 2023-08-01 MED ORDER — INSULIN REGULAR(HUMAN) IN NACL 100-0.9 UT/100ML-% IV SOLN
INTRAVENOUS | Status: DC
  Administered 2023-08-01: 9.5 [IU]/h via INTRAVENOUS
  Filled 2023-08-01: qty 100

## 2023-08-01 MED ORDER — ONDANSETRON HCL 4 MG/2ML IJ SOLN: 4.0000 mg | Freq: Four times a day (QID) | INTRAMUSCULAR | Status: DC | PRN

## 2023-08-01 MED ORDER — DEXTROSE 50 % IV SOLN: 0.0000 mL | INTRAVENOUS | Status: DC | PRN

## 2023-08-01 MED ORDER — MIDODRINE HCL 5 MG PO TABS: 5.0000 mg | ORAL_TABLET | Freq: Every day | ORAL | Status: DC

## 2023-08-01 MED ORDER — LACTATED RINGERS IV SOLN: INTRAVENOUS | Status: DC

## 2023-08-01 MED ORDER — POTASSIUM CHLORIDE 10 MEQ/100ML IV SOLN
10.0000 meq | INTRAVENOUS | Status: AC
  Administered 2023-08-01 (×2): 10 meq via INTRAVENOUS
  Filled 2023-08-01 (×2): qty 100

## 2023-08-01 MED ORDER — ORAL CARE MOUTH RINSE: 15.0000 mL | OROMUCOSAL | Status: DC | PRN

## 2023-08-01 MED ORDER — METOCLOPRAMIDE HCL 5 MG/ML IJ SOLN
5.0000 mg | Freq: Once | INTRAMUSCULAR | Status: AC
  Administered 2023-08-02: 5 mg via INTRAVENOUS
  Filled 2023-08-01: qty 2

## 2023-08-01 MED ORDER — ENOXAPARIN SODIUM 40 MG/0.4ML IJ SOSY
40.0000 mg | PREFILLED_SYRINGE | INTRAMUSCULAR | Status: DC
  Administered 2023-08-01 – 2023-08-02 (×2): 40 mg via SUBCUTANEOUS
  Filled 2023-08-01 (×2): qty 0.4

## 2023-08-01 MED ORDER — POTASSIUM CHLORIDE 10 MEQ/100ML IV SOLN
10.0000 meq | INTRAVENOUS | Status: AC
  Administered 2023-08-01 (×4): 10 meq via INTRAVENOUS
  Filled 2023-08-01 (×4): qty 100

## 2023-08-01 MED ORDER — ACETAMINOPHEN 325 MG PO TABS
650.0000 mg | ORAL_TABLET | Freq: Four times a day (QID) | ORAL | Status: DC | PRN
  Administered 2023-08-02: 650 mg via ORAL
  Filled 2023-08-01 (×2): qty 2

## 2023-08-01 MED ORDER — INSULIN ASPART 100 UNIT/ML IJ SOLN: 0.0000 [IU] | Freq: Three times a day (TID) | INTRAMUSCULAR | Status: DC

## 2023-08-01 MED ORDER — IOHEXOL 350 MG/ML SOLN
75.0000 mL | Freq: Once | INTRAVENOUS | Status: AC | PRN
  Administered 2023-08-01: 75 mL via INTRAVENOUS

## 2023-08-01 MED ORDER — ZOLPIDEM TARTRATE 5 MG PO TABS: 2.5000 mg | ORAL_TABLET | Freq: Every evening | ORAL | Status: DC | PRN

## 2023-08-01 MED ORDER — IOHEXOL 350 MG/ML SOLN: 75.0000 mL | Freq: Once | INTRAVENOUS | Status: DC | PRN

## 2023-08-01 MED ORDER — HYDROMORPHONE HCL 1 MG/ML IJ SOLN
0.5000 mg | Freq: Once | INTRAMUSCULAR | Status: AC
  Administered 2023-08-01: 0.5 mg via INTRAVENOUS
  Filled 2023-08-01: qty 0.5

## 2023-08-01 NOTE — Inpatient Diabetes Management (Signed)
Inpatient Diabetes Program Recommendations  AACE/ADA: New Consensus Statement on Inpatient Glycemic Control   Target Ranges:  Prepandial:   less than 140 mg/dL      Peak postprandial:   less than 180 mg/dL (1-2 hours)      Critically ill patients:  140 - 180 mg/dL    Latest Reference Range & Units 08/01/23 06:38 08/01/23 07:40 08/01/23 08:48 08/01/23 09:54 08/01/23 11:08  Glucose-Capillary 70 - 99 mg/dL 086 (H) 578 (H) 469 (H) 239 (H) 175 (H)   Review of Glycemic Control  Diabetes history: DM1 Outpatient Diabetes medications: Toujeo 12 units at bedtime, Novolog 4 units TID with meals plus correction Current orders for Inpatient glycemic control: IV insulin   Inpatient Diabetes Program Recommendations:    Insulin: Once acidosis has cleared and provider is ready to transition from IV to SQ insulin, please consider ordering Semglee 8 units Q24H, CBGs AC&HS, Novolog 0-9 units AC&HS, and Novolog 3 units TID with meals for meal coverage if patient eats at least 50% of meals.  NOTE: Patient admitted with DKA, gastroparesis, and urinary retention. Per ED provider note on 08/01/23, "Patient reports that she has not taken her insulin in 3 days because she has been busy taking care of her husband." Patient has Type 1 DM and known to inpatient diabetes team due to multiple admissions. Patient was last inpatient in 7/5-7/8 and inpatient diabetes coordinator last spoke with patient on 05/16/23 during prior admission; patient reported she was not taking full dose of insulin because she was stretching out insulin so it would last longer due to cost of insulins.   Thanks, Orlando Penner, RN, MSN, CDCES Diabetes Coordinator Inpatient Diabetes Program [phone] (Team Pager from 8am to 5pm)

## 2023-08-01 NOTE — ED Triage Notes (Signed)
Patient BIB [facility] from home due to hyperglycemia. Patient has c/o of abdominal pain, dry mouth, and frequent urination. Patient states the last time she took insulin was 3 days ago. States she hasn't been taking it because she's been taking care of her husband. CBG with EMS 580 then went to 502 after 250 mL of NS. Patient is A&Ox4.

## 2023-08-01 NOTE — ED Notes (Signed)
ED TO INPATIENT HANDOFF REPORT  ED Nurse Name and Phone #: Beatris Ship RN [phone]  S Name/Age/Gender [name] [name] 62 y.o. female Room/Bed: 033C/033C  Code Status   Code Status: Full Code  Home/SNF/Other Home Patient oriented to: self, place, time, and situation Is this baseline? Yes   Triage Complete: Triage complete  Chief Complaint DKA (diabetic ketoacidosis) ([name]) [E11.10]  Triage Note Patient BIB [facility] from home due to hyperglycemia. Patient has c/o of abdominal pain, dry mouth, and frequent urination. Patient states the last time she took insulin was 3 days ago. States she hasn't been taking it because she's been taking care of her husband. CBG with EMS 580 then went to 502 after 250 mL of NS. Patient is A&Ox4.   Allergies Allergies  Allergen Reactions   Codeine Hives, Anxiety and Other (See Comments)    Level of Care/Admitting Diagnosis ED Disposition     ED Disposition  Admit   Condition  --   Comment  Hospital Area: [facility] [[id number]]  Level of Care: Progressive [102]  Admit to Progressive based on following criteria: GI, ENDOCRINE disease patients with GI bleeding, acute liver failure or pancreatitis, stable with diabetic ketoacidosis or thyrotoxicosis (hypothyroid) state.  May admit patient to [facility] or [facility] if equivalent level of care is available:: No  Covid Evaluation: Asymptomatic - no recent exposure (last 10 days) testing not required  Diagnosis: DKA (diabetic ketoacidosis) ([name]) [960454]  Admitting Physician: Ginnie Smart [[id number]]  Attending Physician: Ninetta Lights, JEFFREY C [[id number]]  Certification:: I certify this patient will need inpatient services for at least 2 midnights  Expected Medical Readiness: 08/03/2023          B Medical/Surgery History Past Medical History:  Diagnosis Date   Allergy    seasonal   Anxiety    B12 deficiency    Chest pain 04/05/2017   CHF (congestive heart failure) ([name])    Diabetes  mellitus type 1, uncontrolled    "dr. 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) 10/29/2013   Leg cramping    "@ night" (09/18/2013)   Migraine    "once q couple months" (11/26/2018)   Osteoporosis    Peripheral neuropathy    Umbilical hernia    unrepaired (09/18/2013)   Past Surgical History:  Procedure Laterality Date   BIOPSY  06/03/2019   Procedure: BIOPSY;  Surgeon: Lynann Bologna, MD;  Location: [facility];  Service: Endoscopy;;   BIOPSY  08/17/2020   Procedure: BIOPSY;  Surgeon: Tressia Danas, MD;  Location: [facility];  Service: Gastroenterology;;   BIOPSY  02/05/2023   Procedure: BIOPSY;  Surgeon: Meryl Dare, MD;  Location: [facility];  Service: Gastroenterology;;   CESAREAN SECTION  1989   ENTEROSCOPY N/A 06/03/2019   Procedure: ENTEROSCOPY;  Surgeon: Lynann Bologna, MD;  Location: [facility];  Service: Endoscopy;  Laterality: N/A;   ESOPHAGOGASTRODUODENOSCOPY (EGD) WITH PROPOFOL N/A 08/17/2020   Procedure: ESOPHAGOGASTRODUODENOSCOPY (EGD) WITH PROPOFOL;  Surgeon: Tressia Danas, MD;  Location: [facility];  Service: Gastroenterology;  Laterality: N/A;   ESOPHAGOGASTRODUODENOSCOPY (EGD) WITH PROPOFOL N/A 02/05/2023   Procedure: ESOPHAGOGASTRODUODENOSCOPY (EGD) WITH PROPOFOL;  Surgeon: Meryl Dare, MD;  Location: [facility];  Service: Gastroenterology;  Laterality: N/A;   LAPAROSCOPIC ASSISTED VAGINAL HYSTERECTOMY  09/23/2012   Procedure: LAPAROSCOPIC ASSISTED VAGINAL HYSTERECTOMY;  Surgeon: Mitchel Honour, DO;  Location: [facility];  Service: Gynecology;  Laterality: N/A;  pull Dr Vance Gather instrument   LEFT HEART CATH AND CORONARY ANGIOGRAPHY N/A 02/11/2017   Procedure: Left Heart Cath and Coronary  Angiography;  Surgeon: Runell Gess, MD;  Location: [facility] IN[facility]IVE CV [facility];  Service: Cardiovascular;  Laterality: N/A;   TUBAL LIGATION Bilateral 1992     A IV Location/Drains/Wounds Patient Lines/Drains/Airways Status     Active Line/Drains/Airways     Name Placement date Placement time Site Days   Peripheral IV 08/01/23 20 G Left;Posterior Hand 08/01/23  --  Hand  less than 1   Peripheral IV 08/01/23 20 G Posterior;Right Hand 08/01/23  0314  Hand  less than 1            Intake/Output Last 24 hours  Intake/Output Summary (Last 24 hours) at 08/01/2023 7846 Last data filed at 08/01/2023 9629 Gross per 24 hour  Intake 1009.92 ml  Output --  Net 1009.92 ml    Labs/Imaging Results for orders placed or performed during the hospital encounter of 08/01/23 (from the past 48 hour(s))  CBG monitoring, ED     Status: Abnormal   Collection Time: 08/01/23  2:59 AM  Result Value Ref Range   Glucose-Capillary >600 (HH) 70 - 99 mg/dL    Comment: Glucose reference range applies only to samples taken after fasting for at least 8 hours.  CBC     Status: Abnormal   Collection Time: 08/01/23  3:15 AM  Result Value Ref Range   WBC 11.9 (H) 4.0 - 10.5 K/uL   RBC 5.19 (H) 3.87 - 5.11 MIL/uL   Hemoglobin 14.8 12.0 - 15.0 g/dL   HCT 52.8 41.3 - 24.4 %   MCV 83.6 80.0 - 100.0 fL   MCH 28.5 26.0 - 34.0 pg   MCHC 34.1 30.0 - 36.0 g/dL   RDW 01.0 27.2 - 53.6 %   Platelets 305 150 - 400 K/uL   nRBC 0.0 0.0 - 0.2 %    Comment: Performed at [facility], [address] [place], [place] [address]  I-stat chem 8, ed     Status: Abnormal   Collection Time: 08/01/23  3:20 AM  Result Value Ref Range   Sodium 124 (L) 135 - 145 mmol/L   Potassium 3.5 3.5 - 5.1 mmol/L   Chloride 85 (L) 98 - 111 mmol/L   BUN 24 (H) 6 - 20 mg/dL   Creatinine, Ser 4.74 0.44 - 1.00 mg/dL   Glucose, Bld 259 (HH) 70 - 99 mg/dL    Comment: Glucose reference range applies only to samples taken after fasting for at  least 8 hours.   Calcium, Ion 0.92 (L) 1.15 - 1.40 mmol/L   TCO2 25 22 - 32 mmol/L   Hemoglobin 15.0 12.0 - 15.0 g/dL   HCT 56.3 87.5 - 64.3 %   Comment NOTIFIED PHYSICIAN   Comprehensive metabolic panel     Status: Abnormal   Collection Time: 08/01/23  4:12 AM  Result Value Ref Range   Sodium 126 (L) 135 - 145 mmol/L   Potassium 3.6 3.5 - 5.1 mmol/L   Chloride 80 (L) 98 - 111 mmol/L   CO2 25 22 - 32 mmol/L   Glucose, Bld 555 (HH) 70 - 99 mg/dL    Comment: CRITICAL RESULT CALLED TO, READ BACK BY AND VERIFIED WITH  Jearld Pies, RN. [phone] 08/01/23. LPAIT Glucose reference range applies only to samples taken after fasting for at least 8 hours.    BUN 21 (H) 6 - 20 mg/dL   Creatinine, Ser 9.60 (H) 0.44 - 1.00 mg/dL   Calcium 9.0 8.9 - 45.4 mg/dL   Total Protein 7.3 6.5 - 8.1 g/dL   Albumin 4.0 3.5 - 5.0 g/dL   AST 14 (L) 15 - 41 U/L   ALT 15 0 - 44 U/L   Alkaline Phosphatase 80 38 - 126 U/L   Total Bilirubin 1.3 (H) 0.3 - 1.2 mg/dL   GFR, Estimated >09 >81 mL/min    Comment: (NOTE) Calculated using the CKD-EPI Creatinine Equation (2021)    Anion gap 21 (H) 5 - 15    Comment: ELECTROLYTES REPEATED TO VERIFY Performed at [facility], [address] [place], [place] [address]   Lipase, blood     Status: Abnormal   Collection Time: 08/01/23  4:12 AM  Result Value Ref Range   Lipase 391 (H) 11 - 51 U/L    Comment: Performed at [facility], [address], [place], [place] [address]  Beta-hydroxybutyric acid     Status: Abnormal   Collection Time: 08/01/23  4:12 AM  Result Value Ref Range   Beta-Hydroxybutyric Acid 3.98 (H) 0.05 - 0.27 mmol/L    Comment: Performed at [facility], [address], [place], [place] [address]  CBG monitoring, ED     Status: Abnormal   Collection Time: 08/01/23  5:20 AM  Result Value Ref Range   Glucose-Capillary 395 (H) 70 - 99 mg/dL    Comment: Glucose reference range applies only to samples taken after fasting for at least 8 hours.   Urinalysis, Routine w reflex microscopic -Urine, Clean Catch     Status: Abnormal   Collection Time: 08/01/23  5:41 AM  Result Value Ref Range   Color, Urine YELLOW YELLOW   APPearance HAZY (A) CLEAR   Specific Gravity, Urine 1.024 1.005 - 1.030   pH 6.0 5.0 - 8.0   Glucose, UA >=500 (A) NEGATIVE mg/dL   Hgb urine dipstick NEGATIVE NEGATIVE   Bilirubin Urine NEGATIVE NEGATIVE   Ketones, ur 80 (A) NEGATIVE mg/dL   Protein, ur 30 (A) NEGATIVE mg/dL   Nitrite NEGATIVE NEGATIVE   Leukocytes,Ua NEGATIVE NEGATIVE   RBC / HPF 0-5 0 - 5 RBC/hpf   WBC, UA 11-20 0 - 5 WBC/hpf   Bacteria, UA FEW (A) NONE SEEN   Squamous Epithelial / HPF 0-5 0 - 5 /HPF    Comment: Performed at [facility], [address] [place], [place] [address]  CBG monitoring, ED     Status: Abnormal   Collection Time: 08/01/23  6:38 AM  Result Value Ref Range   Glucose-Capillary 289 (H) 70 - 99 mg/dL    Comment: Glucose reference range applies only to samples taken after fasting for at least 8 hours.  I-Stat Venous Blood Gas, ED     Status: Abnormal   Collection Time: 08/01/23  7:01 AM  Result Value Ref Range   pH, Ven 7.525 (H) 7.25 - 7.43   pCO2, Ven 35.1 (L) 44 - 60 mmHg   pO2, Ven 106 (H) 32 - 45 mmHg   Bicarbonate 29.0 (H) 20.0 - 28.0 mmol/L   TCO2 30 22 - 32 mmol/L   O2 Saturation 99 %   Acid-Base Excess 6.0 (H) 0.0 - 2.0 mmol/L  Sodium 128 (L) 135 - 145 mmol/L   Potassium 3.9 3.5 - 5.1 mmol/L   Calcium, Ion 0.99 (L) 1.15 - 1.40 mmol/L   HCT 42.0 36.0 - 46.0 %   Hemoglobin 14.3 12.0 - 15.0 g/dL   Sample type VENOUS   CBG monitoring, ED     Status: Abnormal   Collection Time: 08/01/23  7:40 AM  Result Value Ref Range   Glucose-Capillary 277 (H) 70 - 99 mg/dL    Comment: Glucose reference range applies only to samples taken after fasting for at least 8 hours.  CBG monitoring, ED     Status: Abnormal   Collection Time: 08/01/23  8:48 AM  Result Value Ref Range   Glucose-Capillary 251 (H) 70 - 99  mg/dL    Comment: Glucose reference range applies only to samples taken after fasting for at least 8 hours.   CT ABDOMEN PELVIS W CONTRAST  Result Date: 08/01/2023 CLINICAL DATA:  62 year old female with history of acute onset of nonlocalized abdominal pain. Frequent urination. EXAM: CT ABDOMEN AND PELVIS WITH CONTRAST TECHNIQUE: Multidetector CT imaging of the abdomen and pelvis was performed using the standard protocol following bolus administration of intravenous contrast. RADIATION DOSE REDUCTION: This exam was performed according to the departmental dose-optimization program which includes automated exposure control, adjustment of the mA and/or kV according to patient size and/or use of iterative reconstruction technique. CONTRAST:  75mL OMNIPAQUE IOHEXOL 350 MG/ML SOLN COMPARISON:  CT of the abdomen and pelvis 06/14/2023. FINDINGS: Lower chest: Circumferential thickening of the distal esophagus (axial image 3 of series 3). Hepatobiliary: Poorly defined low-attenuation in segments 4A and 4B adjacent to the falciform ligament, similar to prior studies, likely reflective of focal fatty infiltration and/or benign perfusion anomaly (no imaging follow-up recommended). No other definite suspicious appearing hepatic lesions are noted. Generalized low attenuation noted elsewhere throughout the hepatic parenchyma, suggesting a background of hepatic steatosis. No intra or extrahepatic biliary ductal dilatation. Gallbladder is remarkable for some focal thickening near the fundus, similar to the prior study and multiple other more remote prior examinations, likely reflective of adenomyomatosis. Pancreas: No pancreatic mass. No pancreatic ductal dilatation. No pancreatic or peripancreatic fluid collections or inflammatory changes. Spleen: Unremarkable. Adrenals/Urinary Tract: Bilateral kidneys and adrenal glands are normal in appearance. No hydroureteronephrosis. Urinary bladder is again severely distended.  Stomach/Bowel: The appearance of the stomach is normal. There is no pathologic dilatation of small bowel or colon. A few scattered colonic diverticuli are noted, without surrounding inflammatory changes to indicate an acute diverticulitis at this time. Normal appendix. Vascular/Lymphatic: Atherosclerotic calcifications in the abdominal aorta and pelvic vasculature. No lymphadenopathy noted in the abdomen or pelvis. Reproductive: Status post hysterectomy. Ovaries are not confidently identified may be surgically absent or atrophic. Other: Small umbilical hernia containing only omental fat. No significant volume of ascites. No pneumoperitoneum. Musculoskeletal: There are no aggressive appearing lytic or blastic lesions noted in the visualized portions of the skeleton. IMPRESSION: 1. Severe distension of the urinary bladder, similar to the prior study. Clinical correlation for signs and symptoms of chronic bladder outlet obstruction is recommended. 2. Colonic diverticulosis without evidence of acute diverticulitis at this time. 3. Circumferential thickening of the distal esophagus. This may simply represent esophagitis, however, correlation with nonemergent endoscopy is suggested in the near future to exclude the possibility of a distal esophageal mass. 4. Focal thickening of the gallbladder in the region of the fundus, similar to numerous prior examinations. This is favored to represent adenomyomatosis. Follow-up nonemergent outpatient right  upper quadrant abdominal ultrasound should be considered for further evaluation. 5. Hepatic steatosis. 6. Aortic atherosclerosis. 7. Additional incidental findings, as above. Electronically Signed   By: Trudie Reed M.D.   On: 08/01/2023 06:42    Pending Labs Unresulted Labs (From admission, onward)     Start     Ordered   08/01/23 0920  Basic metabolic panel  Now then every 4 hours,   R (with TIMED occurrences)      08/01/23 0919            Vitals/Pain Today's  Vitals   08/01/23 0654 08/01/23 0700 08/01/23 0730 08/01/23 0830  BP:  (!) 139/95 (!) 147/94 (!) 138/108  Pulse:   (!) 101 98  Resp:  15 20 (!) 22  Temp: 98.6 F (37 C)     TempSrc: Oral     SpO2:   98% 98%  Weight:      Height:      PainSc:        Isolation Precautions No active isolations  Medications Medications  insulin regular, human (MYXREDLIN) 100 units/ 100 mL infusion (6.5 Units/hr Intravenous Rate/Dose Change 08/01/23 0854)  lactated ringers infusion ( Intravenous New Bag/Given 08/01/23 0531)  dextrose 5 % in lactated ringers infusion (0 mLs Intravenous Hold 08/01/23 0737)  dextrose 50 % solution 0-50 mL (has no administration in time range)  iohexol (OMNIPAQUE) 350 MG/ML injection 75 mL (has no administration in time range)  enoxaparin (LOVENOX) injection 40 mg (has no administration in time range)  promethazine (PHENERGAN) 6.25 mg/NS 50 mL IVPB (has no administration in time range)  sodium chloride 0.9 % bolus 1,000 mL (0 mLs Intravenous Stopped 08/01/23 0517)  potassium chloride 10 mEq in 100 mL IVPB (0 mEq Intravenous Stopped 08/01/23 0915)  iohexol (OMNIPAQUE) 350 MG/ML injection 75 mL (75 mLs Intravenous Contrast Given 08/01/23 0600)    Mobility walks     R Recommendations: See Admitting Provider Note  Report given to: 1O10

## 2023-08-01 NOTE — Plan of Care (Signed)
  Problem: Education: Goal: Knowledge of General Education information will improve Description Including pain rating scale, medication(s)/side effects and non-pharmacologic comfort measures Outcome: Progressing   

## 2023-08-01 NOTE — ED Provider Notes (Signed)
[place] EMERGENCY DEPARTMENT AT [facility] Provider Note   CSN: [id number] Arrival date & time: 08/01/23  0258     History  Chief Complaint  Patient presents with   Hyperglycemia    [name] [name] is a 62 y.o. female.  Patient presents to the emergency department for evaluation of abdominal pain.  Patient reports that she has not been feeling well for 2 days.  She started having abdominal pain, nausea and vomiting yesterday.  Patient reports that she has not taken her insulin in 3 days because she has been busy taking care of her husband.       Home Medications Prior to Admission medications   Medication Sig Start Date End Date Taking? Authorizing Provider  aspirin EC 81 MG tablet Take 81 mg by mouth daily. Swallow whole.    [provider]  atorvastatin (LIPITOR) 80 MG tablet Take 1 tablet (80 mg total) by mouth daily. Patient not taking: Reported on 06/14/2023 01/12/22   Excell Seltzer, MD  Continuous Blood Gluc Sensor (DEXCOM G6 SENSOR) MISC INJECT 1 SENSOR INTO SKIN EVERY 10 DAYS Patient taking differently: Inject 1 Device into the skin See admin instructions. Place 1 new device into the skin every 10 days 07/18/22   Excell Seltzer, MD  Continuous Blood Gluc Transmit (DEXCOM G6 TRANSMITTER) MISC USE AS DIRECTED FOR CONTINUOUS GLUCOSE MONITORING. REUSE TRANSMITTER X 90DAYS THEN DISCARD & REPLACE 07/05/21   Bedsole, Amy E, MD  Insulin Pen Needle (PEN NEEDLES) 31G X 8 MM MISC Use to inject insulin 4 times a day.  Dx: E11.10 07/21/21   Excell Seltzer, MD  Lancets (ONETOUCH ULTRASOFT) lancets Use to check blood sugar daily as needed.  Dx: E11.10 12/23/17   Excell Seltzer, MD  levothyroxine (SYNTHROID) 137 MCG tablet Take 137 mcg by mouth daily before breakfast. 03/19/22   [provider]  metoCLOPramide (REGLAN) 5 MG tablet Take 1 tablet (5 mg total) by mouth 3 (three) times daily before meals. Patient taking differently: Take 5 mg by mouth 2 (two) times daily  as needed for nausea or vomiting (or heartburn). 02/06/23   Albertine Grates, MD  midodrine (PROAMATINE) 5 MG tablet Take 1 tablet (5 mg total) by mouth 2 (two) times daily with a meal. Hold if your systolic blood pressure  ( top number) is > , please check your blood pressure at home once a day and bring in record for your doctor to review, follow up with your pcp and cardiology regarding your blood pressure medication Patient taking differently: Take 5 mg by mouth See admin instructions. Take 5 mg by mouth in the morning and hold if the Systolic number is greater than 150 02/06/23   Albertine Grates, MD  NOVOLOG FLEXPEN 100 UNIT/ML FlexPen Inject 4 Units into the skin See admin instructions. Inject 4 units into the skin three times a day after meals, PLUS sliding scale coverage    [provider]  pantoprazole (PROTONIX) 40 MG tablet Take 1 tablet (40 mg total) by mouth 2 (two) times daily. Patient not taking: Reported on 06/14/2023 02/06/23 06/14/23  Albertine Grates, MD  TOUJEO SOLOSTAR 300 UNIT/ML Solostar Pen Inject 6 Units into the skin at bedtime. Patient taking differently: Inject 12 Units into the skin at bedtime. 03/29/22   Regalado, Belkys A, MD  zolpidem (AMBIEN) 5 MG tablet Take 2.5 mg by mouth at bedtime as needed for sleep.    [provider]  Allergies    Codeine    Review of Systems   Review of Systems  Physical Exam Updated Vital Signs BP (!) 154/96   Pulse (!) 110   Temp 97.6 F (36.4 C) (Oral)   Resp (!) 23   Ht 4\' 11"  (1.499 m)   Wt 55.3 kg   LMP 08/11/2012   SpO2 98%   BMI 24.64 kg/m  Physical Exam Vitals and nursing note reviewed.  Constitutional:      General: She is not in acute distress.    Appearance: She is well-developed.  HENT:     Head: Normocephalic and atraumatic.     Mouth/Throat:     Mouth: Mucous membranes are moist.  Eyes:     General: Vision grossly intact. Gaze aligned appropriately.     Extraocular Movements: Extraocular movements intact.      Conjunctiva/sclera: Conjunctivae normal.  Cardiovascular:     Rate and Rhythm: Normal rate and regular rhythm.     Pulses: Normal pulses.     Heart sounds: Normal heart sounds, S1 normal and S2 normal. No murmur heard.    No friction rub. No gallop.  Pulmonary:     Effort: Pulmonary effort is normal. No respiratory distress.     Breath sounds: Normal breath sounds.  Abdominal:     General: Bowel sounds are normal.     Palpations: Abdomen is soft.     Tenderness: There is generalized abdominal tenderness. There is no guarding or rebound.     Hernia: No hernia is present.  Musculoskeletal:        General: No swelling.     Cervical back: Full passive range of motion without pain, normal range of motion and neck supple. No spinous process tenderness or muscular tenderness. Normal range of motion.     Right lower leg: No edema.     Left lower leg: No edema.  Skin:    General: Skin is warm and dry.     Capillary Refill: Capillary refill takes less than 2 seconds.     Findings: No ecchymosis, erythema, rash or wound.  Neurological:     General: No focal deficit present.     Mental Status: She is alert and oriented to person, place, and time.     GCS: GCS eye subscore is 4. GCS verbal subscore is 5. GCS motor subscore is 6.     Cranial Nerves: Cranial nerves 2-12 are intact.     Sensory: Sensation is intact.     Motor: Motor function is intact.     Coordination: Coordination is intact.  Psychiatric:        Attention and Perception: Attention normal.        Mood and Affect: Mood normal.        Speech: Speech normal.        Behavior: Behavior normal.     ED Results / Procedures / Treatments   Labs (all labs ordered are listed, but only abnormal results are displayed) Labs Reviewed  CBC - Abnormal; Notable for the following components:      Result Value   WBC 11.9 (*)    RBC 5.19 (*)    All other components within normal limits  COMPREHENSIVE METABOLIC PANEL - Abnormal;  Notable for the following components:   Sodium 126 (*)    Chloride 80 (*)    Glucose, Bld 555 (*)    BUN 21 (*)    Creatinine, Ser 1.03 (*)    AST 14 (*)  Total Bilirubin 1.3 (*)    Anion gap 21 (*)    All other components within normal limits  LIPASE, BLOOD - Abnormal; Notable for the following components:   Lipase 391 (*)    All other components within normal limits  BETA-HYDROXYBUTYRIC ACID - Abnormal; Notable for the following components:   Beta-Hydroxybutyric Acid 3.98 (*)    All other components within normal limits  CBG MONITORING, ED - Abnormal; Notable for the following components:   Glucose-Capillary >600 (*)    All other components within normal limits  CBG MONITORING, ED - Abnormal; Notable for the following components:   Glucose-Capillary 395 (*)    All other components within normal limits  I-STAT CHEM 8, ED - Abnormal; Notable for the following components:   Sodium 124 (*)    Chloride 85 (*)    BUN 24 (*)    Glucose, Bld 558 (*)    Calcium, Ion 0.92 (*)    All other components within normal limits  URINALYSIS, ROUTINE W REFLEX MICROSCOPIC  I-STAT VENOUS BLOOD GAS, ED    EKG EKG Interpretation Date/Time:  Thursday August 01 2023 03:05:03 EDT Ventricular Rate:  96 PR Interval:  181 QRS Duration:  87 QT Interval:  324 QTC Calculation: 410 R Axis:   93  Text Interpretation: Sinus rhythm Right axis deviation Borderline repolarization abnormality Confirmed by Gilda Crease [phone]) on 08/01/2023 3:57:15 AM  Radiology No results found.  Procedures .Critical Care  Performed by: Gilda Crease, MD Authorized by: Gilda Crease, MD   Critical care provider statement:    Critical care time (minutes):  30   Critical care was necessary to treat or prevent imminent or life-threatening deterioration of the following conditions:  Endocrine crisis   Critical care was time spent personally by me on the following activities:  Development of  treatment plan with patient or surrogate, discussions with consultants, evaluation of patient's response to treatment, examination of patient, ordering and review of laboratory studies, ordering and review of radiographic studies, ordering and performing treatments and interventions, pulse oximetry, re-evaluation of patient's condition and review of old charts   I assumed direction of critical care for this patient from another provider in my specialty: no     Care discussed with: admitting provider       Medications Ordered in ED Medications  insulin regular, human (MYXREDLIN) 100 units/ 100 mL infusion (has no administration in time range)  lactated ringers infusion (has no administration in time range)  dextrose 5 % in lactated ringers infusion (has no administration in time range)  dextrose 50 % solution 0-50 mL (has no administration in time range)  potassium chloride 10 mEq in 100 mL IVPB (has no administration in time range)  sodium chloride 0.9 % bolus 1,000 mL (0 mLs Intravenous Stopped 08/01/23 0517)    ED Course/ Medical Decision Making/ A&P                                 Medical Decision Making Amount and/or Complexity of Data Reviewed External Data Reviewed: labs and notes. Labs: ordered. Decision-making details documented in ED Course. Radiology: ordered.  Risk Prescription drug management.   Differential Diagnosis considered includes, but not limited to: Cholelithiasis; cholecystitis; cholangitis; bowel obstruction; esophagitis; gastritis; peptic ulcer disease; pancreatitis; diabetic ketoacidosis  Presents to the emergency department for evaluation of diffuse abdominal pain with nausea and vomiting.  Patient reports that she  has not been taking her insulin for the last 3 days.  She is a poorly controlled type I diabetic.  Lab work ordered.  Patient appears dry, given IV fluid bolus.  Blood sugar greater than 600 at arrival.  Patient with elevated beta hydroxybutyric  acid, anion gap acidosis consistent with diabetic ketoacidosis.  Placed on an insulin drip.  Patient continues to complain of abdominal pain.  Exam is nonfocal.  Patient underwent CT scan which does not show acute pathology.  Patient with significantly distended urinary bladder.  Will place Foley catheter.  Patient to be admitted for treatment of diabetic ketoacidosis secondary to noncompliance with insulin.        Final Clinical Impression(s) / ED Diagnoses Final diagnoses:  Diabetic ketoacidosis without coma associated with type 1 diabetes mellitus [facility])    Rx / DC Orders ED Discharge Orders     None         Dariush Mcnellis, Canary Brim, MD 08/01/23 [id number]

## 2023-08-01 NOTE — Hospital Course (Addendum)
#  DKA 2/2 Insulin Non-Compliance  #Type 1 DM  Patient has long history of T2DM with multiple hospitalizations for DKA due to medication noncompliance. Patient presented to the ED with 24 hours of nausea, vomiting, diffuse abdominal pain similar to previous episodes of DKA. Labs in the ED show blood glucose greater than 600, B-hydroxybutyrate 3.9, ketonuria, anion gap 21. Patient was treated with IV fluid infusion, IV infusion and frequent rechecks of blood glucose and BMP. On the following morning after admission labs showed no further anion gap and appropriate blood glucose levels. Patient resumed a PO diet and was transitioned to home subcutaneous insulin.    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 Reports some discomfort with urination. CT shows severe distension of bladder similar to previous. A urinary catheter was placed with copious return of urine and resolution of discomfort.    #Gallbladder Adenomyomatosis Incidental finding on CT, recommending nonemergent outpatient RUQ ultrasound   #Distal esophagus thickening Incidental finding on CT, recommending nonemergent outpatient endoscopy   #Hypothyroidism Stable, holding synthroid while NPO

## 2023-08-01 NOTE — H&P (Signed)
Date: 08/01/2023               Patient Name:  [name] [name] MRN: [id number]  DOB: 1961/09/03 Age / Sex: 62 y.o., female   PCP: Everardo All, NP         Medical Service: Internal Medicine Teaching Service         Attending Physician: Dr. Ginnie Smart, MD      First Contact: Dr. Monna Fam, MD Pager [phone]    Second Contact: Dr. Olegario Messier, MD Pager [phone]         After Hours (After 5p/  First Contact Pager: [phone]  weekends / holidays): Second Contact Pager: [phone]   SUBJECTIVE   Chief Complaint: Nausea, vomiting  History of Present Illness:   [name] is a 62 year old female with past medical history of insulin dependent type 2 diabetes, hypertension, hyperlipidemia who presents with abdominal pain and vomiting. She states that for the past 24 hours she has been vomiting, and has been experiencing diffuse abdominal pain, which prompted her visit to the emergency department. She does take insulin, however has been inconsistent with taking it due to her husband being ill. Of note, she was recently admitted to [facility] on 06/17/23. She stastes yesterday her blood sugar was in the 500-600 range when checked at home. She also had chronic urinary retention, and has declined catherization in the past. She denies any fevers, chills, diarrhea, or shortness of breath.   ED Course: Pt started on endotool, labs revealed diabetic ketoacidosis, IMTS consulted for admission  Meds:  Aspirin 81mg   Lipitor 80mg   Synthroid  Reglan 5mg   Midodrine 5mg  BID  Toujeo Solostar 6U  Novolog 4U with meals  Pantoprazole 40mg   Ambien 2.5mg  PRN for sleep   Past Medical History Anxiety  CHF T2DM Hyperlipidemia  Hypothyroidism  Osteoporosis   Past Surgical History:  Procedure Laterality Date   BIOPSY  06/03/2019   Procedure: BIOPSY;  Surgeon: Lynann Bologna, MD;  Location: [facility] ENDOSCOPY;  Service: Endoscopy;;   BIOPSY  08/17/2020   Procedure: BIOPSY;   Surgeon: Tressia Danas, MD;  Location: [facility] ENDOSCOPY;  Service: Gastroenterology;;   BIOPSY  02/05/2023   Procedure: BIOPSY;  Surgeon: Meryl Dare, MD;  Location: [facility];  Service: Gastroenterology;;   CESAREAN SECTION  1989   ENTEROSCOPY N/A 06/03/2019   Procedure: ENTEROSCOPY;  Surgeon: Lynann Bologna, MD;  Location: [facility] ENDOSCOPY;  Service: Endoscopy;  Laterality: N/A;   ESOPHAGOGASTRODUODENOSCOPY (EGD) WITH PROPOFOL N/A 08/17/2020   Procedure: ESOPHAGOGASTRODUODENOSCOPY (EGD) WITH PROPOFOL;  Surgeon: Tressia Danas, MD;  Location: [facility] ENDOSCOPY;  Service: Gastroenterology;  Laterality: N/A;   ESOPHAGOGASTRODUODENOSCOPY (EGD) WITH PROPOFOL N/A 02/05/2023   Procedure: ESOPHAGOGASTRODUODENOSCOPY (EGD) WITH PROPOFOL;  Surgeon: Meryl Dare, MD;  Location: [facility] ENDOS[facility];  Service: Gastroenterology;  Laterality: N/A;   LAPAROSCOPIC ASSISTED VAGINAL HYSTERECTOMY  09/23/2012   Procedure: LAPAROSCOPIC ASSISTED VAGINAL HYSTERECTOMY;  Surgeon: Mitchel Honour, DO;  Location: [facility] ORS;  Service: Gynecology;  Laterality: N/A;  pull Dr Vance Gather instrument   LEFT HEART CATH AND CORONARY ANGIOGRAPHY N/A 02/11/2017   Procedure: Left Heart Cath and Coronary Angiography;  Surgeon: Runell Gess, MD;  Location: [facility] INVASIVE CV LAB;  Service: Cardiovascular;  Laterality: N/A;   TUBAL LIGATION Bilateral 1992   Social:  Lives With: Her husband Occupation: Does not work currently Support: Husband at home Level of Function: Independent in all ADLs and IADLs at baseline PCP: Bradly Chris, NP Substances: Per chart  review, she quit smoking cigarrettes about 32 years ago. Smokes marijuana occasionally, denies use of alcohol or illicit substances.   Family History:  Enlarged heart in brother - passed away 3 years ago  Mother also has had heart conditions  Allergies: Allergies as of 08/01/2023 - Review Complete 08/01/2023  Allergen Reaction Noted   Codeine Hives, Anxiety, and Other (See Comments)      Review of Systems: A complete ROS was negative except as per HPI.   OBJECTIVE:   Physical Exam: Blood pressure (!) 138/108, pulse 98, temperature 98.6 F (37 C), temperature source Oral, resp. rate (!) 22, height 4\' 11"  (1.499 m), weight 55.3 kg, last menstrual period 08/11/2012, SpO2 98%.  Constitutional: well-appearing, laying in bed in no acute distress HENT: normocephalic atraumatic, mucous membranes moist Eyes: conjunctiva non-erythematous Neck: supple Cardiovascular: regular rate and rhythm, no m/r/g Pulmonary/Chest: normal work of breathing on room air, lungs clear to auscultation bilaterally Abdominal: soft, non-tender, non-distended MSK: normal bulk and tone Neurological: alert & oriented x 3, 5/5 strength in bilateral upper and lower extremities, normal gait. No loss of sensation on foot exam Skin: warm and dry  Labs: CBC    Component Value Date/Time   WBC 11.9 (H) 08/01/2023 0315   RBC 5.19 (H) 08/01/2023 0315   HGB 14.3 08/01/2023 0701   HGB 15.7 07/11/2020 1231   HCT 42.0 08/01/2023 0701   HCT 48.3 (H) 07/11/2020 1231   PLT 305 08/01/2023 0315   PLT 318 07/11/2020 1231   MCV 83.6 08/01/2023 0315   MCV 89 07/11/2020 1231   MCH 28.5 08/01/2023 0315   MCHC 34.1 08/01/2023 0315   RDW 14.4 08/01/2023 0315   RDW 13.7 07/11/2020 1231   LYMPHSABS 2.1 06/14/2023 1008   MONOABS 0.5 06/14/2023 1008   EOSABS 0.1 06/14/2023 1008   BASOSABS 0.1 06/14/2023 1008     CMP     Component Value Date/Time   NA 128 (L) 08/01/2023 0701   NA 138 09/04/2021 1031   K 3.9 08/01/2023 0701   CL 80 (L) 08/01/2023 0412   CO2 25 08/01/2023 0412   GLUCOSE 555 (HH) 08/01/2023 0412   BUN 21 (H) 08/01/2023 0412   BUN 11 09/04/2021 1031   CREATININE 1.03 (H) 08/01/2023 0412   CREATININE 0.64 01/05/2022 1630   CALCIUM 9.0 08/01/2023 0412   PROT 7.3 08/01/2023 0412   ALBUMIN 4.0 08/01/2023 0412   AST 14 (L) 08/01/2023 0412   ALT 15 08/01/2023 0412   ALKPHOS 80 08/01/2023 0412    BILITOT 1.3 (H) 08/01/2023 0412   GFRNONAA >60 08/01/2023 0412   GFRAA >60 08/19/2020 0514    Imaging:   CT Abdomen/Pelvis:   - Severe distension of urinary bladder   - Colonic diverticulosis w/o acute diverticulitis   - Thickening of distal esophagus   - Gallbladder thickening, consistent with adenomyomatosis  EKG: personally reviewed my interpretation is sinus rate and rhythm right axis devition, similar to previous EKG.   ASSESSMENT & PLAN:   Assessment & Plan by Problem: Active Problems:   DKA (diabetic ketoacidosis) ([name])  Rory C Stoves is a 62 y.o. person living with a history of T2DM who presented with abdominal pain, n/v and admitted for DKA on hospital day 0  #DKA 2/2 Insulin Non-Compliance  #Type 1 DM  #Gastroperesis Patient has long history of T2DM with multiple hospitalizations for DKA due to medication noncompliance. Patient presenting with 24 hours of nausea, vomiting, diffuse abdominal pain similar to previous episodes. Labs in  the ED show blood glucose greater than 600, B-hydroxybutyrate 3.9, ketonuria, anion gap 21.  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 Reports some discomfort with urination. CT shows severe distension of bladder similar to previous -UA shows no UTI -bladder scan -has refused foley in past, will try again  #Gallbladder Adenomyomatosis Incidental finding on CT, recommending nonemergent outpatient RUQ ultrasound  #Distal esophagus thickening Incidental finding on CT, recommending nonemergent outpatient endoscopy  #Hypothyroidism Stable, holding synthroid while NPO  Diet: NPO VTE: Lovenox IVF: LR 125 mL/hr Code: Full  Prior to Admission Living Arrangement: At home with husband Anticipated Discharge Location: Home Barriers to Discharge: medical stability  Dispo: Admit patient to  Inpatient with expected length of stay greater than 2 midnights.  Signed: Monna Fam, MD Internal Medicine Resident PGY- 1  08/01/2023, 9:20 AM

## 2023-08-02 DIAGNOSIS — E1010 Type 1 diabetes mellitus with ketoacidosis without coma: Secondary | ICD-10-CM | POA: Diagnosis not present

## 2023-08-02 LAB — CBC
HCT: 39.6 % (ref 36.0–46.0)
Hemoglobin: 13.4 g/dL (ref 12.0–15.0)
MCH: 29.4 pg (ref 26.0–34.0)
MCHC: 33.8 g/dL (ref 30.0–36.0)
MCV: 86.8 fL (ref 80.0–100.0)
Platelets: 314 10*3/uL (ref 150–400)
RBC: 4.56 MIL/uL (ref 3.87–5.11)
RDW: 14.7 % (ref 11.5–15.5)
WBC: 11.8 10*3/uL — ABNORMAL HIGH (ref 4.0–10.5)
nRBC: 0 % (ref 0.0–0.2)

## 2023-08-02 LAB — GLUCOSE, CAPILLARY
Glucose-Capillary: 149 mg/dL — ABNORMAL HIGH (ref 70–99)
Glucose-Capillary: 169 mg/dL — ABNORMAL HIGH (ref 70–99)
Glucose-Capillary: 167 mg/dL — ABNORMAL HIGH (ref 70–99)
Glucose-Capillary: 167 mg/dL — ABNORMAL HIGH (ref 70–99)
Glucose-Capillary: 159 mg/dL — ABNORMAL HIGH (ref 70–99)
Glucose-Capillary: 148 mg/dL — ABNORMAL HIGH (ref 70–99)
Glucose-Capillary: 284 mg/dL — ABNORMAL HIGH (ref 70–99)

## 2023-08-02 LAB — BASIC METABOLIC PANEL
Anion gap: 13 (ref 5–15)
Anion gap: 8 (ref 5–15)
Anion gap: 6 (ref 5–15)
BUN: 7 mg/dL (ref 6–20)
BUN: 6 mg/dL (ref 6–20)
BUN: <5 mg/dL — ABNORMAL LOW (ref 6–20)
CO2: 22 mmol/L (ref 22–32)
CO2: 26 mmol/L (ref 22–32)
CO2: 26 mmol/L (ref 22–32)
Calcium: 8.5 mg/dL — ABNORMAL LOW (ref 8.9–10.3)
Calcium: 8.3 mg/dL — ABNORMAL LOW (ref 8.9–10.3)
Calcium: 8.1 mg/dL — ABNORMAL LOW (ref 8.9–10.3)
Chloride: 100 mmol/L (ref 98–111)
Chloride: 100 mmol/L (ref 98–111)
Chloride: 99 mmol/L (ref 98–111)
Creatinine, Ser: 0.53 mg/dL (ref 0.44–1.00)
Creatinine, Ser: 0.5 mg/dL (ref 0.44–1.00)
Creatinine, Ser: 0.44 mg/dL (ref 0.44–1.00)
GFR, Estimated: >60 mL/min (ref >60)
GFR, Estimated: >60 mL/min (ref >60)
GFR, Estimated: >60 mL/min (ref >60)
Glucose, Bld: 180 mg/dL — ABNORMAL HIGH (ref 70–99)
Glucose, Bld: 165 mg/dL — ABNORMAL HIGH (ref 70–99)
Glucose, Bld: 159 mg/dL — ABNORMAL HIGH (ref 70–99)
Potassium: 3.5 mmol/L (ref 3.5–5.1)
Potassium: 3.4 mmol/L — ABNORMAL LOW (ref 3.5–5.1)
Potassium: 3.6 mmol/L (ref 3.5–5.1)
Sodium: 135 mmol/L (ref 135–145)
Sodium: 134 mmol/L — ABNORMAL LOW (ref 135–145)
Sodium: 131 mmol/L — ABNORMAL LOW (ref 135–145)

## 2023-08-02 MED ORDER — ONDANSETRON HCL 4 MG/2ML IJ SOLN
2.0000 mg | Freq: Once | INTRAMUSCULAR | Status: AC
  Administered 2023-08-02: 2 mg via INTRAVENOUS
  Filled 2023-08-02: qty 2

## 2023-08-02 MED ORDER — POTASSIUM CHLORIDE 10 MEQ/100ML IV SOLN
10.0000 meq | INTRAVENOUS | Status: AC
  Administered 2023-08-02 (×4): 10 meq via INTRAVENOUS
  Filled 2023-08-02 (×4): qty 100

## 2023-08-02 NOTE — Progress Notes (Signed)
Subjective:   Summary: [name] [name] is a 62 y.o. year old female currently admitted on the IMTS HD#1 for DKA.  Overnight Events: abdominal pain, patient was refusing oral meds.   Saw patient at bedside this am. She was feeling much better and felt ready to eat some breakfast. Discussed plan to transition to her home insulin regimen and send her home if her labs continue to improve.   Saw patient at bedside this pm. She was having some abdominal pain and was tearful, concerned this might happen again if she is discharged today. Agreed to accept Tylenol, if she is feeling better and PM labs still show improvement we will reevaluate and still plan on discharge today.   Objective:  Vital signs in last 24 hours: Vitals:   08/01/23 2243 08/01/23 2339 08/02/23 0147 08/02/23 0722  BP: (!) 134/94 104/78 106/68 119/89  Pulse: 90 91 86 89  Resp: 14 13 16 18   Temp:  98.7 F (37.1 C) 98.6 F (37 C) 98.4 F (36.9 C)  TempSrc:  Oral Oral Oral  SpO2: 99% 98% 99% 99%  Weight:      Height:       Supplemental O2: Room Air SpO2: 99 %   Physical Exam:  Constitutional: well-appearing, in no acute distress Cardiovascular: RRR, no murmurs, rubs or gallops Pulmonary/Chest: normal work of breathing on room air, lungs clear to auscultation bilaterally Abdominal: soft, non-tender, non-distended Skin: warm and dry Extremities: upper/lower extremity pulses 2+, no lower extremity edema present  Filed Weights   08/01/23 0301  Weight: 55.3 kg     Intake/Output Summary (Last 24 hours) at 08/02/2023 1204 Last data filed at 08/02/2023 1141 Gross per 24 hour  Intake 3998.48 ml  Output 3225 ml  Net 773.48 ml   Net IO Since Admission: 533.4 mL [08/02/23 1204]  Pertinent Labs:    Latest Ref Rng & Units 08/02/2023    3:17 AM 08/01/2023    7:01 AM 08/01/2023    3:20 AM  CBC  WBC 4.0 - 10.5 K/uL 11.8     Hemoglobin 12.0 - 15.0 g/dL 78.2  95.6  21.3   Hematocrit 36.0 - 46.0 %  39.6  42.0  44.0   Platelets 150 - 400 K/uL 314          Latest Ref Rng & Units 08/02/2023    3:17 AM 08/02/2023   12:18 AM 08/01/2023    4:13 PM  CMP  Glucose 70 - 99 mg/dL 086  578  469   BUN 6 - 20 mg/dL 6  7  10    Creatinine 0.44 - 1.00 mg/dL 6.29  5.28  4.13   Sodium 135 - 145 mmol/L 134  135  133   Potassium 3.5 - 5.1 mmol/L 3.4  3.5  3.3   Chloride 98 - 111 mmol/L 100  100  99   CO2 22 - 32 mmol/L 26  22  25    Calcium 8.9 - 10.3 mg/dL 8.3  8.5  7.9     Imaging: No results found.   Assessment/Plan:   Active Problems:   DKA (diabetic ketoacidosis) ([name])   Patient Summary: [name] [name] is a 62 y.o. with a pertinent PMH of T1DM, who presented with abdominal pain and admitted for DKA.   #DKA 2/2 Insulin Non-Compliance  #Type 1 DM  #Gastroperesis Patient has long history of T2DM with multiple hospitalizations for  DKA due to medication noncompliance. Patient presenting with 24 hours of nausea, vomiting, diffuse abdominal pain similar to previous episodes. Labs in the ED show blood glucose greater than 600, B-hydroxybutyrate 3.9, ketonuria, anion gap 21.  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 Reports some discomfort with urination. CT shows severe distension of bladder similar to previous -UA shows no UTI -foley placed for urinary retention -q6 bladder scan   #Gallbladder Adenomyomatosis Incidental finding on CT, recommending nonemergent outpatient RUQ ultrasound   #Distal esophagus thickening Incidental finding on CT, recommending nonemergent outpatient endoscopy   #Hypothyroidism Stable, holding synthroid while NPO  Diet: Carb modified  IVF: None VTE: Lovenox Code: Full   Dispo:  Anticipated discharge to Home in 1 days pending    Monna Fam, MD PGY-1 Internal Medicine Resident Pager Number [phone] Please contact the on call pager after 5 pm and on weekends at [phone].

## 2023-08-02 NOTE — Discharge Instructions (Addendum)
You were hospitalized for diabetic ketoacidosis. You were treated with IV fluids and an insulin infusion. At this time your ketoacidosis has been resolved and your blood sugars are at a normal level. I feel comfortable discharging you home with outpatient follow up. Thank you for allowing [place] to be part of your care.   Please follow up with your PCP within the next 1-2 weeks  Please continue taking your insulin as you have been taking it, being very careful not to miss doses even when you are busy.    Please make sure to return to the hospital if you have worsening abdominal pain or nausea and vomiting.   Please call our clinic if you have any questions or concerns, we may be able to help and keep you from a long and expensive emergency room wait. Our clinic and after hours phone number is [phone], the best time to call is Monday through Friday 9 am to 4 pm but there is always someone available 24/7 if you have an emergency. If you need medication refills please notify your pharmacy one week in advance and they will send [place] a request.

## 2023-08-02 NOTE — Discharge Summary (Signed)
Name: [name] [name] MRN: [id number] DOB: [birth year] 62 y.o. PCP: Everardo All, NP  Date of Admission: 08/01/2023  2:58 AM Date of Discharge: 08/02/23 Attending Physician: Dr. Ninetta Lights  Discharge Diagnosis: Active Problems:   DKA (diabetic ketoacidosis) [facility])    Discharge Medications: Allergies as of 08/02/2023       Reactions   Codeine Hives, Anxiety, Other (See Comments)        Medication List     TAKE these medications    aspirin EC 81 MG tablet Take 81 mg by mouth daily. Swallow whole.   atorvastatin 80 MG tablet Commonly known as: LIPITOR Take 1 tablet (80 mg total) by mouth daily.   Dexcom G6 Sensor Misc INJECT 1 SENSOR INTO SKIN EVERY 10 DAYS What changed: See the new instructions.   Dexcom G6 Transmitter Misc USE AS DIRECTED FOR CONTINUOUS GLUCOSE MONITORING. REUSE TRANSMITTER X 90DAYS THEN DISCARD & REPLACE   Glucagon Emergency 1 MG Kit Inject 1 mg into the skin See admin instructions. Inject one mg into the skin see administration instructions. Follow package directions for low blood sugar.   levothyroxine 137 MCG tablet Commonly known as: SYNTHROID Take 137 mcg by mouth daily before breakfast.   metoCLOPramide 5 MG tablet Commonly known as: REGLAN Take 1 tablet (5 mg total) by mouth 3 (three) times daily before meals.   midodrine 5 MG tablet Commonly known as: PROAMATINE Take 1 tablet (5 mg total) by mouth 2 (two) times daily with a meal. Hold if your systolic blood pressure  ( top number) is > , please check your blood pressure at home once a day and bring in record for your doctor to review, follow up with your pcp and cardiology regarding your blood pressure medication What changed:  when to take this additional instructions   NovoLOG FlexPen 100 UNIT/ML FlexPen Generic drug: insulin aspart Inject 4 Units into the skin See admin instructions. Inject 4 units into the skin three times a day after meals, PLUS sliding scale coverage    onetouch ultrasoft lancets Use to check blood sugar daily as needed.  Dx: E11.10   pantoprazole 40 MG tablet Commonly known as: Protonix Take 1 tablet (40 mg total) by mouth 2 (two) times daily.   Pen Needles 31G X 8 MM Misc Use to inject insulin 4 times a day.  Dx: E11.10   ranolazine 500 MG 12 hr tablet Commonly known as: RANEXA Take 500 mg by mouth 2 (two) times daily.   rosuvastatin 10 MG tablet Commonly known as: CRESTOR Take 10 mg by mouth at bedtime.   Toujeo SoloStar 300 UNIT/ML Solostar Pen Generic drug: insulin glargine (1 Unit Dial) Inject 6 Units into the skin at bedtime. What changed: how much to take   zolpidem 5 MG tablet Commonly known as: AMBIEN Take 2.5 mg by mouth at bedtime as needed for sleep.        Disposition and follow-up:   Ms.Elvi C Ueda was discharged from [facility] in Stable condition.  At the hospital follow up visit please address:  1.  Follow-up:  a. T1DM, DKA: ensure patient has been taking insulin as prescribed, check BG and A1c    b. Urinary retention: patient describes chronic issues with urinary retention. 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 Distal esophagus thickening: incidental finding on CT, recommending nonemergent outpatient endoscopy  2.  Labs / imaging needed at time of follow-up: blood glucose, A1c  3.  Pending labs/ test needing follow-up: none  4.  Medication Changes: none  Follow-up Appointments: as scheduled with PCP   Hospital Course by problem list:   #DKA 2/2 Insulin Non-Compliance  #Type 1 DM  Patient has long history of T2DM with multiple hospitalizations for DKA due to medication noncompliance. Patient presented to the ED with 24 hours of nausea, vomiting, diffuse abdominal pain similar to previous episodes of DKA. Labs in the ED show blood glucose  greater than 600, B-hydroxybutyrate 3.9, ketonuria, anion gap 21. Patient was treated with IV fluid infusion, IV infusion and frequent rechecks of blood glucose and BMP. On the following morning after admission labs showed no further anion gap and appropriate blood glucose levels. Patient resumed a PO diet and was transitioned to home subcutaneous insulin.    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 Reports some discomfort with urination. CT shows severe distension of bladder similar to previous. A urinary catheter was placed with resolution of discomfort.    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 She was feeling much better and felt ready to eat some breakfast. Discussed plan to transition to her home insulin regimen and send her home if her labs continue to improve.    Discharge Exam:   BP 130/88 (BP Location: Right Arm)   Pulse 89   Temp 98.4 F (36.9 C) (Oral)   Resp 18   Ht 4\' 11"  (1.499 m)   Wt 55.3 kg   LMP 08/11/2012   SpO2 97%   BMI 24.64 kg/m  Constitutional: well-appearing, in no acute distress HENT: normocephalic atraumatic, mucous membranes moist Eyes: conjunctiva non-erythematous Neck: supple Cardiovascular: regular rate and rhythm, no m/r/g Pulmonary/Chest: normal work of breathing on room air, lungs clear to auscultation bilaterally Abdominal: soft, non-tender, non-distended MSK: normal bulk and tone Neurological: alert & oriented x 3, 5/5 strength in bilateral upper and lower extremities, normal gait Skin: warm and dry  Pertinent Labs, Studies, and Procedures:     Latest Ref Rng & Units 08/02/2023    3:17 AM 08/01/2023    7:01 AM  08/01/2023    3:20 AM  CBC  WBC 4.0 - 10.5 K/uL 11.8     Hemoglobin 12.0 - 15.0 g/dL 24.4  01.0  27.2   Hematocrit 36.0 - 46.0 % 39.6  42.0  44.0   Platelets 150 - 400 K/uL 314          Latest Ref Rng & Units 08/02/2023   11:45 AM 08/02/2023    3:17 AM 08/02/2023   12:18 AM  CMP  Glucose 70 - 99 mg/dL 536  644  034   BUN 6 - 20 mg/dL 5  6  7    Creatinine 0.44 - 1.00 mg/dL 7.42  5.95  6.38   Sodium 135 - 145 mmol/L 131  134  135   Potassium 3.5 - 5.1 mmol/L 3.6  3.4  3.5   Chloride 98 - 111 mmol/L 99  100  100   CO2 22 - 32 mmol/L 26  26  22    Calcium 8.9 - 10.3 mg/dL 8.1  8.3  8.5     CT ABDOMEN PELVIS W CONTRAST  Result  Date: 08/01/2023 CLINICAL DATA:  62 year old female with history of acute onset of nonlocalized abdominal pain. Frequent urination. EXAM: CT ABDOMEN AND PELVIS WITH CONTRAST TECHNIQUE: Multidetector CT imaging of the abdomen and pelvis was performed using the standard protocol following bolus administration of intravenous contrast. RADIATION DOSE REDUCTION: This exam was performed according to the departmental dose-optimization program which includes automated exposure control, adjustment of the mA and/or kV according to patient size and/or use of iterative reconstruction technique. CONTRAST:  75mL OMNIPAQUE IOHEXOL 350 MG/ML SOLN COMPARISON:  CT of the abdomen and pelvis 06/14/2023. FINDINGS: Lower chest: Circumferential thickening of the distal esophagus (axial image 3 of series 3). Hepatobiliary: Poorly defined low-attenuation in segments 4A and 4B adjacent to the falciform ligament, similar to prior studies, likely reflective of focal fatty infiltration and/or benign perfusion anomaly (no imaging follow-up recommended). No other definite suspicious appearing hepatic lesions are noted. Generalized low attenuation noted elsewhere throughout the hepatic parenchyma, suggesting a background of hepatic steatosis. No intra or extrahepatic biliary ductal dilatation. Gallbladder  is remarkable for some focal thickening near the fundus, similar to the prior study and multiple other more remote prior examinations, likely reflective of adenomyomatosis. Pancreas: No pancreatic mass. No pancreatic ductal dilatation. No pancreatic or peripancreatic fluid collections or inflammatory changes. Spleen: Unremarkable. Adrenals/Urinary Tract: Bilateral kidneys and adrenal glands are normal in appearance. No hydroureteronephrosis. Urinary bladder is again severely distended. Stomach/Bowel: The appearance of the stomach is normal. There is no pathologic dilatation of small bowel or colon. A few scattered colonic diverticuli are noted, without surrounding inflammatory changes to indicate an acute diverticulitis at this time. Normal appendix. Vascular/Lymphatic: Atherosclerotic calcifications in the abdominal aorta and pelvic vasculature. No lymphadenopathy noted in the abdomen or pelvis. Reproductive: Status post hysterectomy. Ovaries are not confidently identified may be surgically absent or atrophic. Other: Small umbilical hernia containing only omental fat. No significant volume of ascites. No pneumoperitoneum. Musculoskeletal: There are no aggressive appearing lytic or blastic lesions noted in the visualized portions of the skeleton. IMPRESSION: 1. Severe distension of the urinary bladder, similar to the prior study. Clinical correlation for signs and symptoms of chronic bladder outlet obstruction is recommended. 2. Colonic diverticulosis without evidence of acute diverticulitis at this time. 3. Circumferential thickening of the distal esophagus. This may simply represent esophagitis, however, correlation with nonemergent endoscopy is suggested in the near future to exclude the possibility of a distal esophageal mass. 4. Focal thickening of the gallbladder in the region of the fundus, similar to numerous prior examinations. This is favored to represent adenomyomatosis. Follow-up nonemergent outpatient  right upper quadrant abdominal ultrasound should be considered for further evaluation. 5. Hepatic steatosis. 6. Aortic atherosclerosis. 7. Additional incidental findings, as above. Electronically Signed   By: Trudie Reed M.D.   On: 08/01/2023 06:42    Signed: Monna Fam, MD PGY-1 08/02/2023, 1:10 PM   Pager: [phone]

## 2023-08-08 DIAGNOSIS — F319 Bipolar disorder, unspecified: Secondary | ICD-10-CM | POA: Diagnosis not present

## 2023-08-08 DIAGNOSIS — F4312 Post-traumatic stress disorder, chronic: Secondary | ICD-10-CM | POA: Diagnosis not present

## 2023-08-22 ENCOUNTER — Emergency Department (HOSPITAL_COMMUNITY): Payer: BC Managed Care – PPO

## 2023-08-22 ENCOUNTER — Encounter (HOSPITAL_COMMUNITY): Payer: Self-pay | Admitting: Emergency Medicine

## 2023-08-22 ENCOUNTER — Other Ambulatory Visit: Payer: Self-pay

## 2023-08-22 ENCOUNTER — Inpatient Hospital Stay (HOSPITAL_COMMUNITY)
Admission: EM | Admit: 2023-08-22 | Discharge: 2023-08-25 | DRG: 639 | Disposition: A | Discharge status: Home / Self Care | Payer: BC Managed Care – PPO | Attending: Internal Medicine | Admitting: Internal Medicine

## 2023-08-22 DIAGNOSIS — A419 Sepsis, unspecified organism: Secondary | ICD-10-CM | POA: Diagnosis not present

## 2023-08-22 DIAGNOSIS — M545 Low back pain, unspecified: Secondary | ICD-10-CM | POA: Diagnosis not present

## 2023-08-22 DIAGNOSIS — Z79890 Hormone replacement therapy (postmenopausal): Secondary | ICD-10-CM

## 2023-08-22 DIAGNOSIS — E1065 Type 1 diabetes mellitus with hyperglycemia: Secondary | ICD-10-CM | POA: Diagnosis present

## 2023-08-22 DIAGNOSIS — E109 Type 1 diabetes mellitus without complications: Secondary | ICD-10-CM | POA: Diagnosis present

## 2023-08-22 DIAGNOSIS — Z87891 Personal history of nicotine dependence: Secondary | ICD-10-CM | POA: Diagnosis not present

## 2023-08-22 DIAGNOSIS — Z820 Family history of epilepsy and other diseases of the nervous system: Secondary | ICD-10-CM

## 2023-08-22 DIAGNOSIS — E785 Hyperlipidemia, unspecified: Secondary | ICD-10-CM | POA: Diagnosis not present

## 2023-08-22 DIAGNOSIS — N390 Urinary tract infection, site not specified: Principal | ICD-10-CM

## 2023-08-22 DIAGNOSIS — Z23 Encounter for immunization: Secondary | ICD-10-CM

## 2023-08-22 DIAGNOSIS — E039 Hypothyroidism, unspecified: Secondary | ICD-10-CM | POA: Diagnosis not present

## 2023-08-22 DIAGNOSIS — I10 Essential (primary) hypertension: Secondary | ICD-10-CM | POA: Diagnosis present

## 2023-08-22 DIAGNOSIS — Z794 Long term (current) use of insulin: Secondary | ICD-10-CM | POA: Diagnosis not present

## 2023-08-22 DIAGNOSIS — I129 Hypertensive chronic kidney disease with stage 1 through stage 4 chronic kidney disease, or unspecified chronic kidney disease: Secondary | ICD-10-CM | POA: Diagnosis present

## 2023-08-22 DIAGNOSIS — R112 Nausea with vomiting, unspecified: Secondary | ICD-10-CM | POA: Diagnosis not present

## 2023-08-22 DIAGNOSIS — E1010 Type 1 diabetes mellitus with ketoacidosis without coma: Principal | ICD-10-CM | POA: Diagnosis present

## 2023-08-22 DIAGNOSIS — K5730 Diverticulosis of large intestine without perforation or abscess without bleeding: Secondary | ICD-10-CM | POA: Diagnosis not present

## 2023-08-22 DIAGNOSIS — Z7982 Long term (current) use of aspirin: Secondary | ICD-10-CM | POA: Diagnosis not present

## 2023-08-22 DIAGNOSIS — M549 Dorsalgia, unspecified: Secondary | ICD-10-CM | POA: Diagnosis not present

## 2023-08-22 DIAGNOSIS — R1084 Generalized abdominal pain: Secondary | ICD-10-CM | POA: Diagnosis not present

## 2023-08-22 DIAGNOSIS — M810 Age-related osteoporosis without current pathological fracture: Secondary | ICD-10-CM | POA: Diagnosis present

## 2023-08-22 DIAGNOSIS — Z833 Family history of diabetes mellitus: Secondary | ICD-10-CM

## 2023-08-22 DIAGNOSIS — E1110 Type 2 diabetes mellitus with ketoacidosis without coma: Secondary | ICD-10-CM | POA: Diagnosis present

## 2023-08-22 DIAGNOSIS — Z885 Allergy status to narcotic agent status: Secondary | ICD-10-CM | POA: Diagnosis not present

## 2023-08-22 DIAGNOSIS — K429 Umbilical hernia without obstruction or gangrene: Secondary | ICD-10-CM | POA: Diagnosis not present

## 2023-08-22 DIAGNOSIS — Z79899 Other long term (current) drug therapy: Secondary | ICD-10-CM | POA: Diagnosis not present

## 2023-08-22 DIAGNOSIS — K219 Gastro-esophageal reflux disease without esophagitis: Secondary | ICD-10-CM | POA: Diagnosis present

## 2023-08-22 DIAGNOSIS — R1011 Right upper quadrant pain: Secondary | ICD-10-CM | POA: Diagnosis not present

## 2023-08-22 DIAGNOSIS — Z8249 Family history of ischemic heart disease and other diseases of the circulatory system: Secondary | ICD-10-CM

## 2023-08-22 DIAGNOSIS — R339 Retention of urine, unspecified: Secondary | ICD-10-CM | POA: Diagnosis present

## 2023-08-22 DIAGNOSIS — R739 Hyperglycemia, unspecified: Secondary | ICD-10-CM | POA: Diagnosis not present

## 2023-08-22 DIAGNOSIS — E1042 Type 1 diabetes mellitus with diabetic polyneuropathy: Secondary | ICD-10-CM | POA: Diagnosis present

## 2023-08-22 DIAGNOSIS — K760 Fatty (change of) liver, not elsewhere classified: Secondary | ICD-10-CM | POA: Diagnosis not present

## 2023-08-22 DIAGNOSIS — N182 Chronic kidney disease, stage 2 (mild): Secondary | ICD-10-CM | POA: Diagnosis not present

## 2023-08-22 DIAGNOSIS — E1022 Type 1 diabetes mellitus with diabetic chronic kidney disease: Secondary | ICD-10-CM | POA: Diagnosis present

## 2023-08-22 LAB — CBC WITH DIFFERENTIAL/PLATELET
Abs Immature Granulocytes: 0.12 10*3/uL — ABNORMAL HIGH (ref 0.00–0.07)
Abs Immature Granulocytes: 0.07 10*3/uL (ref 0.00–0.07)
Basophils Absolute: 0.1 10*3/uL (ref 0.0–0.1)
Basophils Absolute: 0 10*3/uL (ref 0.0–0.1)
Basophils Relative: 0 %
Basophils Relative: 0 %
Eosinophils Absolute: 0 10*3/uL (ref 0.0–0.5)
Eosinophils Absolute: 0 10*3/uL (ref 0.0–0.5)
Eosinophils Relative: 0 %
Eosinophils Relative: 0 %
HCT: 49.9 % — ABNORMAL HIGH (ref 36.0–46.0)
HCT: 45.5 % (ref 36.0–46.0)
Hemoglobin: 16.4 g/dL — ABNORMAL HIGH (ref 12.0–15.0)
Hemoglobin: 15.2 g/dL — ABNORMAL HIGH (ref 12.0–15.0)
Immature Granulocytes: 1 %
Immature Granulocytes: 1 %
Lymphocytes Relative: 13 %
Lymphocytes Relative: 10 %
Lymphs Abs: 2 10*3/uL (ref 0.7–4.0)
Lymphs Abs: 1.3 10*3/uL (ref 0.7–4.0)
MCH: 28.9 pg (ref 26.0–34.0)
MCH: 29.1 pg (ref 26.0–34.0)
MCHC: 32.9 g/dL (ref 30.0–36.0)
MCHC: 33.4 g/dL (ref 30.0–36.0)
MCV: 87.9 fL (ref 80.0–100.0)
MCV: 87.2 fL (ref 80.0–100.0)
Monocytes Absolute: 0.5 10*3/uL (ref 0.1–1.0)
Monocytes Absolute: 0.3 10*3/uL (ref 0.1–1.0)
Monocytes Relative: 3 %
Monocytes Relative: 2 %
Neutro Abs: 13.1 10*3/uL — ABNORMAL HIGH (ref 1.7–7.7)
Neutro Abs: 11.4 10*3/uL — ABNORMAL HIGH (ref 1.7–7.7)
Neutrophils Relative %: 83 %
Neutrophils Relative %: 87 %
Platelets: 410 10*3/uL — ABNORMAL HIGH (ref 150–400)
Platelets: 404 10*3/uL — ABNORMAL HIGH (ref 150–400)
RBC: 5.68 MIL/uL — ABNORMAL HIGH (ref 3.87–5.11)
RBC: 5.22 MIL/uL — ABNORMAL HIGH (ref 3.87–5.11)
RDW: 15.1 % (ref 11.5–15.5)
RDW: 14.7 % (ref 11.5–15.5)
WBC: 15.8 10*3/uL — ABNORMAL HIGH (ref 4.0–10.5)
WBC: 13.1 10*3/uL — ABNORMAL HIGH (ref 4.0–10.5)
nRBC: 0 % (ref 0.0–0.2)
nRBC: 0 % (ref 0.0–0.2)

## 2023-08-22 LAB — BASIC METABOLIC PANEL
Anion gap: 18 — ABNORMAL HIGH (ref 5–15)
BUN: 16 mg/dL (ref 6–20)
CO2: 21 mmol/L — ABNORMAL LOW (ref 22–32)
Calcium: 9.3 mg/dL (ref 8.9–10.3)
Chloride: 97 mmol/L — ABNORMAL LOW (ref 98–111)
Creatinine, Ser: 0.68 mg/dL (ref 0.44–1.00)
GFR, Estimated: >60 mL/min (ref >60)
Glucose, Bld: 357 mg/dL — ABNORMAL HIGH (ref 70–99)
Potassium: 2.9 mmol/L — ABNORMAL LOW (ref 3.5–5.1)
Sodium: 136 mmol/L (ref 135–145)

## 2023-08-22 LAB — COMPREHENSIVE METABOLIC PANEL
ALT: 16 U/L (ref 0–44)
AST: 20 U/L (ref 15–41)
Albumin: 4.9 g/dL (ref 3.5–5.0)
Alkaline Phosphatase: 89 U/L (ref 38–126)
Anion gap: 20 — ABNORMAL HIGH (ref 5–15)
BUN: 20 mg/dL (ref 6–20)
CO2: 20 mmol/L — ABNORMAL LOW (ref 22–32)
Calcium: 9.5 mg/dL (ref 8.9–10.3)
Chloride: 97 mmol/L — ABNORMAL LOW (ref 98–111)
Creatinine, Ser: 0.64 mg/dL (ref 0.44–1.00)
GFR, Estimated: >60 mL/min (ref >60)
Glucose, Bld: 445 mg/dL — ABNORMAL HIGH (ref 70–99)
Potassium: 3.8 mmol/L (ref 3.5–5.1)
Sodium: 137 mmol/L (ref 135–145)
Total Bilirubin: 1.1 mg/dL (ref 0.3–1.2)
Total Protein: 8.9 g/dL — ABNORMAL HIGH (ref 6.5–8.1)

## 2023-08-22 LAB — I-STAT CG4 LACTIC ACID, ED
Lactic Acid, Venous: 4.4 mmol/L (ref 0.5–1.9)
Lactic Acid, Venous: 2 mmol/L (ref 0.5–1.9)

## 2023-08-22 LAB — URINALYSIS, ROUTINE W REFLEX MICROSCOPIC
Bilirubin Urine: NEGATIVE
Glucose, UA: >=500 mg/dL — AB
Hgb urine dipstick: NEGATIVE
Ketones, ur: 20 mg/dL — AB
Leukocytes,Ua: NEGATIVE
Nitrite: NEGATIVE
Protein, ur: 30 mg/dL — AB
Specific Gravity, Urine: 1.025 (ref 1.005–1.030)
pH: 7 (ref 5.0–8.0)

## 2023-08-22 LAB — I-STAT CHEM 8, ED
BUN: 25 mg/dL — ABNORMAL HIGH (ref 6–20)
Calcium, Ion: 1.07 mmol/L — ABNORMAL LOW (ref 1.15–1.40)
Chloride: 100 mmol/L (ref 98–111)
Creatinine, Ser: 0.4 mg/dL — ABNORMAL LOW (ref 0.44–1.00)
Glucose, Bld: 468 mg/dL — ABNORMAL HIGH (ref 70–99)
HCT: 49 % — ABNORMAL HIGH (ref 36.0–46.0)
Hemoglobin: 16.7 g/dL — ABNORMAL HIGH (ref 12.0–15.0)
Potassium: 4.7 mmol/L (ref 3.5–5.1)
Sodium: 136 mmol/L (ref 135–145)
TCO2: 24 mmol/L (ref 22–32)

## 2023-08-22 LAB — LIPASE, BLOOD: Lipase: 40 U/L (ref 11–51)

## 2023-08-22 LAB — CBG MONITORING, ED
Glucose-Capillary: 190 mg/dL — ABNORMAL HIGH (ref 70–99)
Glucose-Capillary: 238 mg/dL — ABNORMAL HIGH (ref 70–99)
Glucose-Capillary: 365 mg/dL — ABNORMAL HIGH (ref 70–99)
Glucose-Capillary: 374 mg/dL — ABNORMAL HIGH (ref 70–99)

## 2023-08-22 LAB — BLOOD GAS, VENOUS
Acid-base deficit: 1.7 mmol/L (ref 0.0–2.0)
Acid-base deficit: 2.4 mmol/L — ABNORMAL HIGH (ref 0.0–2.0)
Bicarbonate: 24.3 mmol/L (ref 20.0–28.0)
Bicarbonate: 23.2 mmol/L (ref 20.0–28.0)
O2 Saturation: 85 %
O2 Saturation: 78.5 %
Patient temperature: 37
Patient temperature: 37
pCO2, Ven: 45 mmHg (ref 44–60)
pCO2, Ven: 42 mmHg — ABNORMAL LOW (ref 44–60)
pH, Ven: 7.34 (ref 7.25–7.43)
pH, Ven: 7.35 (ref 7.25–7.43)
pO2, Ven: 53 mmHg — ABNORMAL HIGH (ref 32–45)
pO2, Ven: 46 mmHg — ABNORMAL HIGH (ref 32–45)

## 2023-08-22 LAB — BETA-HYDROXYBUTYRIC ACID: Beta-Hydroxybutyric Acid: 2.77 mmol/L — ABNORMAL HIGH (ref 0.05–0.27)

## 2023-08-22 MED ORDER — LACTATED RINGERS IV SOLN: INTRAVENOUS | Status: DC

## 2023-08-22 MED ORDER — SODIUM CHLORIDE 0.9 % IV SOLN
12.5000 mg | Freq: Four times a day (QID) | INTRAVENOUS | Status: DC | PRN
  Administered 2023-08-22 – 2023-08-23 (×3): 12.5 mg via INTRAVENOUS
  Filled 2023-08-22 (×3): qty 12.5

## 2023-08-22 MED ORDER — ACETAMINOPHEN 500 MG PO TABS
1000.0000 mg | ORAL_TABLET | Freq: Four times a day (QID) | ORAL | Status: DC | PRN
  Administered 2023-08-24: 1000 mg via ORAL
  Filled 2023-08-22: qty 2

## 2023-08-22 MED ORDER — POTASSIUM CHLORIDE 10 MEQ/100ML IV SOLN
10.0000 meq | INTRAVENOUS | Status: AC
  Administered 2023-08-22 (×2): 10 meq via INTRAVENOUS
  Filled 2023-08-22 (×2): qty 100

## 2023-08-22 MED ORDER — POTASSIUM CHLORIDE 10 MEQ/100ML IV SOLN
10.0000 meq | INTRAVENOUS | Status: AC
  Administered 2023-08-22 – 2023-08-23 (×4): 10 meq via INTRAVENOUS
  Filled 2023-08-22 (×4): qty 100

## 2023-08-22 MED ORDER — ONDANSETRON HCL 4 MG/2ML IJ SOLN
4.0000 mg | Freq: Once | INTRAMUSCULAR | Status: AC
  Administered 2023-08-22: 4 mg via INTRAVENOUS
  Filled 2023-08-22: qty 2

## 2023-08-22 MED ORDER — ONDANSETRON HCL 4 MG/2ML IJ SOLN
4.0000 mg | Freq: Four times a day (QID) | INTRAMUSCULAR | Status: DC | PRN
  Administered 2023-08-22 – 2023-08-23 (×2): 4 mg via INTRAVENOUS
  Filled 2023-08-22 (×3): qty 2

## 2023-08-22 MED ORDER — FENTANYL CITRATE PF 50 MCG/ML IJ SOSY
50.0000 ug | PREFILLED_SYRINGE | Freq: Once | INTRAMUSCULAR | Status: AC
  Administered 2023-08-22: 50 ug via INTRAVENOUS
  Filled 2023-08-22: qty 1

## 2023-08-22 MED ORDER — LACTATED RINGERS IV BOLUS
1000.0000 mL | Freq: Once | INTRAVENOUS | Status: AC
  Administered 2023-08-22: 1000 mL via INTRAVENOUS

## 2023-08-22 MED ORDER — HYDROMORPHONE HCL 1 MG/ML IJ SOLN
0.5000 mg | INTRAMUSCULAR | Status: DC | PRN
  Administered 2023-08-22 – 2023-08-23 (×2): 0.5 mg via INTRAVENOUS
  Filled 2023-08-22 (×3): qty 1

## 2023-08-22 MED ORDER — LACTATED RINGERS IV BOLUS (SEPSIS)
500.0000 mL | Freq: Once | INTRAVENOUS | Status: AC
  Administered 2023-08-22: 500 mL via INTRAVENOUS

## 2023-08-22 MED ORDER — DEXTROSE 50 % IV SOLN: 0.0000 mL | INTRAVENOUS | Status: DC | PRN

## 2023-08-22 MED ORDER — SODIUM CHLORIDE 0.9 % IV SOLN
2.0000 g | Freq: Once | INTRAVENOUS | Status: AC
  Administered 2023-08-22: 2 g via INTRAVENOUS
  Filled 2023-08-22: qty 12.5

## 2023-08-22 MED ORDER — LEVOTHYROXINE SODIUM 25 MCG PO TABS
137.0000 ug | ORAL_TABLET | Freq: Every day | ORAL | Status: DC
  Administered 2023-08-23 – 2023-08-25 (×3): 137 ug via ORAL
  Filled 2023-08-22 (×4): qty 1

## 2023-08-22 MED ORDER — INSULIN REGULAR(HUMAN) IN NACL 100-0.9 UT/100ML-% IV SOLN
INTRAVENOUS | Status: DC
  Administered 2023-08-22: 2.4 [IU]/h via INTRAVENOUS
  Administered 2023-08-22: 8 [IU]/h via INTRAVENOUS
  Filled 2023-08-22: qty 100

## 2023-08-22 MED ORDER — PANTOPRAZOLE SODIUM 40 MG PO TBEC
40.0000 mg | DELAYED_RELEASE_TABLET | Freq: Two times a day (BID) | ORAL | Status: DC
  Administered 2023-08-23 – 2023-08-25 (×4): 40 mg via ORAL
  Filled 2023-08-22 (×4): qty 1

## 2023-08-22 MED ORDER — IOHEXOL 300 MG/ML  SOLN
100.0000 mL | Freq: Once | INTRAMUSCULAR | Status: AC | PRN
  Administered 2023-08-22: 100 mL via INTRAVENOUS

## 2023-08-22 MED ORDER — LACTATED RINGERS IV BOLUS
20.0000 mL/kg | Freq: Once | INTRAVENOUS | Status: AC
  Administered 2023-08-22: 1106 mL via INTRAVENOUS

## 2023-08-22 MED ORDER — LABETALOL HCL 5 MG/ML IV SOLN: 10.0000 mg | INTRAVENOUS | Status: DC | PRN

## 2023-08-22 MED ORDER — METRONIDAZOLE 500 MG/100ML IV SOLN
500.0000 mg | Freq: Once | INTRAVENOUS | Status: AC
  Administered 2023-08-22: 500 mg via INTRAVENOUS
  Filled 2023-08-22: qty 100

## 2023-08-22 MED ORDER — ENOXAPARIN SODIUM 40 MG/0.4ML IJ SOSY
40.0000 mg | PREFILLED_SYRINGE | INTRAMUSCULAR | Status: DC
  Administered 2023-08-22 – 2023-08-24 (×3): 40 mg via SUBCUTANEOUS
  Filled 2023-08-22 (×3): qty 0.4

## 2023-08-22 MED ORDER — DEXTROSE IN LACTATED RINGERS 5 % IV SOLN: INTRAVENOUS | Status: DC

## 2023-08-22 NOTE — ED Provider Notes (Signed)
.  Critical Care  Performed by: Glyn Ade, MD Authorized by: Glyn Ade, MD   Critical care provider statement:    Critical care time (minutes):  95   Critical care was necessary to treat or prevent imminent or life-threatening deterioration of the following conditions:  Sepsis, endocrine crisis and metabolic crisis   Critical care was time spent personally by me on the following activities:  Development of treatment plan with patient or surrogate, discussions with consultants, evaluation of patient's response to treatment, examination of patient, ordering and review of laboratory studies, ordering and review of radiographic studies, ordering and performing treatments and interventions, pulse oximetry, re-evaluation of patient's condition and review of old charts   Care discussed with: admitting provider    Care patient received prior provider at 1500 pending results. Results demonstrated DKA as well as sepsis with lactic acidosis.  Treated with IV fluid boluses for stabilization, insulin protocol initiated for DKA and patient arranged for admission to medicine.  Observed closely reassessed at bedside no acute distress admitted to stepdown unit.   Glyn Ade, MD 08/22/23 2255

## 2023-08-22 NOTE — ED Provider Notes (Signed)
[place] [facility]ENC[facility] DEPARTMENT AT [facility] Provider Note   CSN: [id number] Arrival date & time: 08/22/23  1305     History  Chief Complaint  Patient presents with   Abdominal Pain   Nausea    [name] [name] is a 62 y.o. female.   Abdominal Pain This is a 62 year old female history of CKD, CHF, type 2 diabetes, GERD, hypertension, hypothyroidism presenting for vomiting.  She states she woke up this morning with multiple episodes of emesis.  She feels like she is retaining urine as well.  Pain is suprapubic and left-sided.  She has had multiple episodes of nonbloody and bilious emesis.  Normal bowel movements without diarrhea or melena or hematochezia.  No fevers or chills.  No chest pain or shortness of breath.     Home Medications Prior to Admission medications   Medication Sig Start Date End Date Taking? Authorizing Provider  aspirin EC 81 MG tablet Take 81 mg by mouth daily. Swallow whole.   Yes [provider]  atorvastatin (LIPITOR) 80 MG tablet Take 1 tablet (80 mg total) by mouth daily. 01/12/22  Yes Bedsole, Amy E, MD  Continuous Blood Gluc Sensor (DEXCOM G6 SENSOR) MISC INJECT 1 SENSOR INTO SKIN EVERY 10 DAYS Patient taking differently: Inject 1 Device into the skin See admin instructions. Place 1 new device into the skin every 10 days 07/18/22  Yes Bedsole, Amy E, MD  Continuous Blood Gluc Transmit (DEXCOM G6 TRANSMITTER) MISC USE AS DIRECTED FOR CONTINUOUS GLUCOSE MONITORING. REUSE TRANSMITTER X 90DAYS THEN DISCARD & REPLACE 07/05/21  Yes Bedsole, Amy E, MD  Glucagon, rDNA, (GLUCAGON EMERGENCY) 1 MG KIT Inject 1 mg into the skin See admin instructions. Inject one mg into the skin see administration instructions. Follow package directions for low blood sugar. 06/18/23  Yes [provider]  levothyroxine (SYNTHROID) 137 MCG tablet Take 137 mcg by mouth daily before breakfast. 03/19/22  Yes [provider]  metoCLOPramide (REGLAN) 5 MG tablet  Take 1 tablet (5 mg total) by mouth 3 (three) times daily before meals. 02/06/23  Yes Albertine Grates, MD  midodrine (PROAMATINE) 5 MG tablet Take 1 tablet (5 mg total) by mouth 2 (two) times daily with a meal. Hold if your systolic blood pressure  ( top number) is > , please check your blood pressure at home once a day and bring in record for your doctor to review, follow up with your pcp and cardiology regarding your blood pressure medication Patient taking differently: Take 5 mg by mouth See admin instructions. Take 5 mg by mouth in the morning and hold if the Systolic number is greater than 150 02/06/23  Yes Albertine Grates, MD  NOVOLOG FLEXPEN 100 UNIT/ML FlexPen Inject 4 Units into the skin See admin instructions. Inject 4 units into the skin three times a day after meals, PLUS sliding scale coverage   Yes [provider]  pantoprazole (PROTONIX) 40 MG tablet Take 1 tablet (40 mg total) by mouth 2 (two) times daily. 02/06/23 08/22/23 Yes Albertine Grates, MD  ranolazine (RANEXA) 500 MG 12 hr tablet Take 500 mg by mouth 2 (two) times daily. 07/23/23  Yes [provider]  rosuvastatin (CRESTOR) 10 MG tablet Take 10 mg by mouth at bedtime. 06/17/23  Yes [provider]  TOUJEO SOLOSTAR 300 UNIT/ML Solostar Pen Inject 6 Units into the skin at bedtime. Patient taking differently: Inject 12 Units into the skin at bedtime. 03/29/22  Yes Regalado, Prentiss Bells, MD  zolpidem (  AMBIEN) 5 MG tablet Take 2.5 mg by mouth at bedtime as needed for sleep.   Yes [provider]  Insulin Pen Needle (PEN NEEDLES) 31G X 8 MM MISC Use to inject insulin 4 times a day.  Dx: E11.10 07/21/21   Excell Seltzer, MD  Lancets (ONETOUCH ULTRASOFT) lancets Use to check blood sugar daily as needed.  Dx: E11.10 12/23/17   Excell Seltzer, MD      Allergies    Codeine    Review of Systems   Review of Systems  Gastrointestinal:  Positive for abdominal pain.  Review of systems completed and notable as per HPI.  ROS  otherwise negative.   Physical Exam Updated Vital Signs BP (!) 148/73   Pulse 97   Temp 97.7 F (36.5 C) (Axillary)   Resp 14   Ht 4\' 11"  (1.499 m)   Wt 56.5 kg   LMP 08/11/2012   SpO2 100%   BMI 25.16 kg/m  Physical Exam Vitals and nursing note reviewed.  Constitutional:      General: She is in acute distress.     Appearance: She is well-developed. She is not ill-appearing.  HENT:     Head: Normocephalic and atraumatic.  Eyes:     Conjunctiva/sclera: Conjunctivae normal.  Cardiovascular:     Rate and Rhythm: Normal rate and regular rhythm.     Heart sounds: No murmur heard. Pulmonary:     Effort: Pulmonary effort is normal. No respiratory distress.     Breath sounds: Normal breath sounds.  Abdominal:     Palpations: Abdomen is soft.     Tenderness: There is generalized abdominal tenderness and tenderness in the suprapubic area, left upper quadrant and left lower quadrant.  Musculoskeletal:        General: No swelling.     Cervical back: Neck supple.  Skin:    General: Skin is warm and dry.     Capillary Refill: Capillary refill takes less than 2 seconds.  Neurological:     Mental Status: She is alert.  Psychiatric:        Mood and Affect: Mood normal.     ED Results / Procedures / Treatments   Labs (all labs ordered are listed, but only abnormal results are displayed) Labs Reviewed  COMPREHENSIVE METABOLIC PANEL - Abnormal; Notable for the following components:      Result Value   Chloride 97 (*)    CO2 20 (*)    Glucose, Bld 445 (*)    Total Protein 8.9 (*)    Anion gap 20 (*)    All other components within normal limits  CBC WITH DIFFERENTIAL/PLATELET - Abnormal; Notable for the following components:   WBC 15.8 (*)    RBC 5.68 (*)    Hemoglobin 16.4 (*)    HCT 49.9 (*)    Platelets 410 (*)    Neutro Abs 13.1 (*)    Abs Immature Granulocytes 0.12 (*)    All other components within normal limits  BLOOD GAS, VENOUS - Abnormal; Notable for the  following components:   pO2, Ven 53 (*)    All other components within normal limits  URINALYSIS, ROUTINE W REFLEX MICROSCOPIC - Abnormal; Notable for the following components:   APPearance HAZY (*)    Glucose, UA >=500 (*)    Ketones, ur 20 (*)    Protein, ur 30 (*)    Bacteria, UA RARE (*)    All other components within normal limits  BETA-HYDROXYBUTYRIC ACID -  Abnormal; Notable for the following components:   Beta-Hydroxybutyric Acid 2.77 (*)    All other components within normal limits  BASIC METABOLIC PANEL - Abnormal; Notable for the following components:   Potassium 2.9 (*)    Chloride 97 (*)    CO2 21 (*)    Glucose, Bld 357 (*)    Anion gap 18 (*)    All other components within normal limits  BASIC METABOLIC PANEL - Abnormal; Notable for the following components:   Chloride 97 (*)    Glucose, Bld 156 (*)    All other components within normal limits  BETA-HYDROXYBUTYRIC ACID - Abnormal; Notable for the following components:   Beta-Hydroxybutyric Acid 3.18 (*)    All other components within normal limits  CBC WITH DIFFERENTIAL/PLATELET - Abnormal; Notable for the following components:   WBC 13.1 (*)    RBC 5.22 (*)    Hemoglobin 15.2 (*)    Platelets 404 (*)    Neutro Abs 11.4 (*)    All other components within normal limits  BLOOD GAS, VENOUS - Abnormal; Notable for the following components:   pCO2, Ven 42 (*)    pO2, Ven 46 (*)    Acid-base deficit 2.4 (*)    All other components within normal limits  GLUCOSE, CAPILLARY - Abnormal; Notable for the following components:   Glucose-Capillary 183 (*)    All other components within normal limits  I-STAT CG4 LACTIC ACID, ED - Abnormal; Notable for the following components:   Lactic Acid, Venous 4.4 (*)    All other components within normal limits  I-STAT CHEM 8, ED - Abnormal; Notable for the following components:   BUN 25 (*)    Creatinine, Ser 0.40 (*)    Glucose, Bld 468 (*)    Calcium, Ion 1.07 (*)     Hemoglobin 16.7 (*)    HCT 49.0 (*)    All other components within normal limits  I-STAT CG4 LACTIC ACID, ED - Abnormal; Notable for the following components:   Lactic Acid, Venous 2.0 (*)    All other components within normal limits  CBG MONITORING, ED - Abnormal; Notable for the following components:   Glucose-Capillary 374 (*)    All other components within normal limits  CBG MONITORING, ED - Abnormal; Notable for the following components:   Glucose-Capillary 365 (*)    All other components within normal limits  CBG MONITORING, ED - Abnormal; Notable for the following components:   Glucose-Capillary 238 (*)    All other components within normal limits  CBG MONITORING, ED - Abnormal; Notable for the following components:   Glucose-Capillary 190 (*)    All other components within normal limits  CBG MONITORING, ED - Abnormal; Notable for the following components:   Glucose-Capillary 203 (*)    All other components within normal limits  CBG MONITORING, ED - Abnormal; Notable for the following components:   Glucose-Capillary 182 (*)    All other components within normal limits  CBG MONITORING, ED - Abnormal; Notable for the following components:   Glucose-Capillary 139 (*)    All other components within normal limits  MRSA NEXT GEN BY PCR, NASAL  CULTURE, BLOOD (SINGLE)  URINE CULTURE  LIPASE, BLOOD  BETA-HYDROXYBUTYRIC ACID  HEMOGLOBIN A1C  CBC    EKG None  Radiology CT ABDOMEN PELVIS W CONTRAST  Result Date: 08/22/2023 CLINICAL DATA:  Abdomen pain nausea vomiting EXAM: CT ABDOMEN AND PELVIS WITH CONTRAST TECHNIQUE: Multidetector CT imaging of the abdomen and pelvis  was performed using the standard protocol following bolus administration of intravenous contrast. RADIATION DOSE REDUCTION: This exam was performed according to the departmental dose-optimization program which includes automated exposure control, adjustment of the mA and/or kV according to patient size and/or use  of iterative reconstruction technique. CONTRAST:  OMNIPAQUE IOHEXOL 300 MG/ML  SOLN COMPARISON:  CT 08/01/2023, 06/14/2023, 05/14/2023, and prior exams dating back to 06/03/2017 FINDINGS: Lower chest: Lung bases demonstrate no acute airspace disease. Hepatobiliary: Hepatic steatosis. No calcified gallstone. Stable thickening at the gallbladder fundus compared with multiple priors and likely due to adenomyomatosis. No biliary dilatation Pancreas: Unremarkable. No pancreatic ductal dilatation or surrounding inflammatory changes. Spleen: Normal in size without focal abnormality. Adrenals/Urinary Tract: Adrenal glands are unremarkable. Kidneys are normal, without renal calculi, focal lesion, or hydronephrosis. The bladder is diffusely thick walled in appearance. Stomach/Bowel: Stomach is nonenlarged. There is no dilated small bowel. Colon diverticular disease. Collapsed appearance versus mild colitis type changes of the distal descending and sigmoid colon. Negative appendix Vascular/Lymphatic: Mild aortic atherosclerosis. No aneurysm. No suspicious lymph nodes. Reproductive: Status post hysterectomy. No adnexal masses. Other: Negative for pelvic effusion or free air. Small fat containing umbilical hernia Musculoskeletal: No acute or suspicious osseous abnormality. IMPRESSION: 1. Collapsed appearance versus mild colitis type changes of the distal descending and sigmoid colon. 2. Diffusely thick walled appearance of the bladder, cystitis versus changes related to chronic obstruction. 3. Hepatic steatosis. 4. Aortic atherosclerosis. Aortic Atherosclerosis (ICD10-I70.0). Electronically Signed   By: Jasmine Pang M.D.   On: 08/22/2023 17:15    Procedures Procedures    Medications Ordered in ED Medications  ondansetron (ZOFRAN) injection 4 mg (4 mg Intravenous Given 08/22/23 1746)  insulin regular, human (MYXREDLIN) 100 units/ 100 mL infusion (2.4 Units/hr Intravenous Infusion Verify 08/23/23 0459)  dextrose 5 %  in lactated ringers infusion ( Intravenous Paused 08/23/23 0452)  dextrose 50 % solution 0-50 mL (has no administration in time range)  promethazine (PHENERGAN) 12.5 mg in sodium chloride 0.9 % 50 mL IVPB (0 mg Intravenous Stopped 08/23/23 0407)  enoxaparin (LOVENOX) injection 40 mg (40 mg Subcutaneous Given 08/22/23 2144)  acetaminophen (TYLENOL) tablet 1,000 mg (has no administration in time range)  HYDROmorphone (DILAUDID) injection 0.5 mg (0.5 mg Intravenous Given 08/23/23 0324)  labetalol (NORMODYNE) injection 10 mg (has no administration in time range)  levothyroxine (SYNTHROID) tablet 137 mcg (has no administration in time range)  pantoprazole (PROTONIX) EC tablet 40 mg (has no administration in time range)  Chlorhexidine Gluconate Cloth 2 % PADS 6 each (6 each Topical Given 08/23/23 0345)  influenza vac split trivalent PF (FLULAVAL) injection 0.5 mL (has no administration in time range)  Oral care mouth rinse (has no administration in time range)  insulin glargine-yfgn (SEMGLEE) injection 8 Units (8 Units Subcutaneous Given 08/23/23 0451)  insulin aspart (novoLOG) injection 0-15 Units (has no administration in time range)  insulin aspart (novoLOG) injection 0-5 Units (has no administration in time range)  lactated ringers infusion ( Intravenous Infusion Verify 08/23/23 0459)  ondansetron (ZOFRAN) injection 4 mg (4 mg Intravenous Given 08/22/23 1442)  lactated ringers bolus 1,000 mL (0 mLs Intravenous Stopped 08/22/23 1610)  fentaNYL (SUBLIMAZE) injection 50 mcg (50 mcg Intravenous Given 08/22/23 1443)  lactated ringers bolus 500 mL (0 mLs Intravenous Stopped 08/22/23 1745)  ceFEPIme (MAXIPIME) 2 g in sodium chloride 0.9 % 100 mL IVPB (0 g Intravenous Stopped 08/22/23 1750)  metroNIDAZOLE (FLAGYL) IVPB 500 mg (0 mg Intravenous Stopped 08/22/23 1941)  iohexol (OMNIPAQUE) 300 MG/ML  solution 100 mL (100 mLs Intravenous Contrast Given 08/22/23 1540)  lactated ringers bolus 1,106 mL (1,106 mLs Intravenous  Bolus 08/22/23 2054)  potassium chloride 10 mEq in 100 mL IVPB (0 mEq Intravenous Stopping previously hung infusion 08/22/23 2245)  potassium chloride 10 mEq in 100 mL IVPB (0 mEq Intravenous Stopped 08/23/23 0426)    ED Course/ Medical Decision Making/ A&P Clinical Course as of 08/23/23 0658  Thu Aug 22, 2023  1609 Watcher HO from [place] 89 YOF with a chief complaint of AP/NV Urinary retention (1L PVR) [CC]    Clinical Course User Index [CC] Glyn Ade, MD                                 Medical Decision Making Amount and/or Complexity of Data Reviewed Labs: ordered. Radiology: ordered.  Risk Prescription drug management. Decision regarding hospitalization.   Medical Decision Making:   KAWSAR BOULETTE is a 62 y.o. female who presented to the ED today with abdominal pain, emesis.  Vital signs notable for hypertension and tachycardia.  She has some tenderness over her bladder and left hemiabdomen.  Could be urinary retention, consider possible infection, nephrolithiasis, pyelonephritis.  Also consider DKA given recent admission for similar.  Obtain CT scan, lab workup and treat symptomatically.   Patient placed on continuous vitals and telemetry monitoring while in ED which was reviewed periodically.  Reviewed and confirmed nursing documentation for past medical history, family history, social history.   Reassessment and Plan:   Blood gas here is reassuring.  Lactic acid is elevated as well as white blood cell count concern for possible sepsis.  CMP is pending.  Antibiotics ordered for sepsis, CT pending as well.  Handoff given to Dr. Doran Durand at 4 PM with plan to follow-up labs and CT scan, patient will need admission.   Patient's presentation is most consistent with acute presentation with potential threat to life or bodily function.           Final Clinical Impression(s) / ED Diagnoses Final diagnoses:  Lower urinary tract infectious disease  Sepsis without  acute organ dysfunction, due to unspecified organism [facility])    Rx / DC Orders ED Discharge Orders     None         Laurence Spates, MD 08/23/23 [id number]

## 2023-08-22 NOTE — ED Triage Notes (Signed)
Pt arriving via [facility] for abdominal pain, N/V. Pt believes she may have a kidney stone. Pt received 4mg  Zofran, Fentanyl, and NS via EMS. Pt A&O x4.

## 2023-08-22 NOTE — Hospital Course (Addendum)
[name] [name] is a 62 y.o. female with medical history significant for insulin-dependent T2DM with recurrent DKA, HTN, HLD, hypothyroidism, marijuana use who is admitted with DKA. She has also had difficulty with urinary retention recently requiring Foley catheter placement and evaluation with urology outpatient.  Catheter has recently been removed and she has been voiding well at home prior to hospitalization.  Upon admission, she again had acute urinary retention requiring straight cath after admission.

## 2023-08-22 NOTE — Progress Notes (Signed)
A consult was received from an ED physician for cefepime per pharmacy dosing.  The patient's profile has been reviewed for ht/wt/allergies/indication/available labs.    A one time order has been placed for cefepime 2gm IV x1.  Further antibiotics/pharmacy consults should be ordered by admitting physician if indicated.                       Thank you, Lucia Gaskins 08/22/2023  3:33 PM

## 2023-08-22 NOTE — H&P (Signed)
History and Physical    [name] [name] UEA:[id number] DOB: 25-Nov-1961 DOA: 08/22/2023  PCP: Everardo All, NP  Patient coming from: Home  I have personally briefly reviewed patient's old medical records in [facility]  Chief Complaint: Nausea, vomiting, urinary retention  HPI: [name] [name] is a 62 y.o. female with medical history significant for insulin-dependent T2DM with recurrent DKA, HTN, HLD, hypothyroidism, marijuana use who presents to the ED for evaluation of nausea, vomiting, concern of urinary retention.  Patient frequently admitted due to issues related with hyperglycemia/DKA and nausea/vomiting.  Last admitted at [facility] 8/22-8/23 for DKA treated with IV insulin and transition to subcu insulin.  She was also noted to have urinary retention.  Urinary catheter was placed with improvement but not kept in place on discharge.  She was advised to follow-up with urology for any further issues with urinary retention but has not done so.  Patient states she has now developed generalized abdominal pain associated with nausea and vomiting beginning this morning.  She also reports continued bladder issues although she also states that she has been urinating well on her own at home.  She does report some burning sensation with urination.  She states that she has been using her insulin as prescribed.  She denies chest pain or dyspnea.  She reports smoking marijuana, last used 3 days ago.  She denies any alcohol use.  ED Course  Labs/Imaging on admission: I have personally reviewed following labs and imaging studies.  Initial vitals showed BP 192/109, pulse 102, RR 17, temp 97.5 F, SpO2 100% on room air.  Labs show sodium 137, potassium 3.8, bicarb 20, BUN 20, creatinine 0.64, serum glucose 445, anion gap 20, LFTs within normal limits, lipase 40, WBC 15.8, hemoglobin 16.4, platelets 410,000, beta-hydroxybutyrate 2.77, lactic acid 4.4 > 2.0.  Urinalysis showed >500 glucose, 20  ketones, 30 protein, negative nitrites, negative leukocytes, 0-5 RBCs and WBCs, rare bacteria on microscopy.  Single blood culture in process.  CT abdomen/pelvis with contrast showed collapsed appearance versus mild colitis type changes of the distal descending and sigmoid colon.  Diffusely thick-walled appearance of the bladder noted, cystitis versus changes related to chronic obstruction.  Hepatic steatosis and aortic atherosclerosis also reported.  Bladder scan showed 999 cc urine in bladder.  In-N-Out cath performed with total 1450 urine output.  Patient was given 1.5 L LR, IV cefepime and Flagyl, IV Zofran and subsequently started on IV insulin infusion.  The hospitalist service was consulted to admit for further evaluation and management.  Review of Systems: All systems reviewed and are negative except as documented in history of present illness above.   Past Medical History:  Diagnosis Date   Allergy    seasonal   Anxiety    B12 deficiency    Chest pain 04/05/2017   CHF (congestive heart failure) ([name])    Diabetes mellitus type 1, uncontrolled    "dr. 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) 10/29/2013   Leg cramping    "@ night" (09/18/2013)   Migraine    "once q couple months" (11/26/2018)   Osteoporosis    Peripheral neuropathy    Umbilical hernia    unrepaired (09/18/2013)  Past Surgical History:  Procedure Laterality Date   BIOPSY  06/03/2019   Procedure: BIOPSY;  Surgeon: Lynann Bologna, MD;  Location: [facility];  Service: Endoscopy;;   BIOPSY  08/17/2020   Procedure: BIOPSY;  Surgeon: Tressia Danas, MD;  Location: [facility];  Service: Gastroenterology;;   BIOPSY  02/05/2023   Procedure: BIOPSY;   Surgeon: Meryl Dare, MD;  Location: [facility];  Service: Gastroenterology;;   CESAREAN SECTION  1989   ENTEROSCOPY N/A 06/03/2019   Procedure: ENTEROSCOPY;  Surgeon: Lynann Bologna, MD;  Location: [facility];  Service: Endoscopy;  Laterality: N/A;   ESOPHAGOGASTRODUODENOSCOPY (EGD) WITH PROPOFOL N/A 08/17/2020   Procedure: ESOPHAGOGASTRODUODENOSCOPY (EGD) WITH PROPOFOL;  Surgeon: Tressia Danas, MD;  Location: [facility];  Service: Gastroenterology;  Laterality: N/A;   ESOPHAGOGASTRODUODENOSCOPY (EGD) WITH PROPOFOL N/A 02/05/2023   Procedure: ESOPHAGOGASTRODUODENOSCOPY (EGD) WITH PROPOFOL;  Surgeon: Meryl Dare, MD;  Location: [facility];  Service: Gastroenterology;  Laterality: N/A;   LAPAROSCOPIC ASSISTED VAGINAL HYSTERECTOMY  09/23/2012   Procedure: LAPAROSCOPIC ASSISTED VAGINAL HYSTERECTOMY;  Surgeon: Mitchel Honour, DO;  Location: [facility];  Service: Gynecology;  Laterality: N/A;  pull Dr Vance Gather instrument   LEFT HEART CATH AND CORONARY ANGIOGRAPHY N/A 02/11/2017   Procedure: Left Heart Cath and Coronary Angiography;  Surgeon: Runell Gess, MD;  Location: [facility] CV LAB;  Service: Cardiovascular;  Laterality: N/A;   TUBAL LIGATION Bilateral 1992    Social History:  reports that she quit smoking about 32 years ago. Her smoking use included cigarettes. She started smoking about 42 years ago. She has a 1.2 pack-year smoking history. She has never used smokeless tobacco. She reports that she does not currently use drugs after having used the following drugs: Marijuana. She reports that she does not drink alcohol.  Allergies  Allergen Reactions   Codeine Hives, Anxiety and Other (See Comments)    Family History  Problem Relation Age of Onset   Diabetes Other    Heart disease Other    Heart failure Mother 30       Her heart skipped a beat and stopped - per pt   Diabetes Maternal Grandmother    Alzheimer's disease Maternal Grandmother    Coronary artery disease  Maternal Grandmother    Heart attack Maternal Grandfather    Heart failure Brother    Diabetes Brother    Colon polyps Neg Hx    Esophageal cancer Neg Hx    Liver cancer Neg Hx    Pancreatic cancer Neg Hx    Stomach cancer Neg Hx    Crohn's disease Neg Hx    Rectal cancer Neg Hx      Prior to Admission medications   Medication Sig Start Date End Date Taking? Authorizing Provider  aspirin EC 81 MG tablet Take 81 mg by mouth daily. Swallow whole.    [provider]  atorvastatin (LIPITOR) 80 MG tablet Take 1 tablet (80 mg total) by mouth daily. Patient not taking: Reported on 06/14/2023 01/12/22   Excell Seltzer, MD  Continuous Blood Gluc Sensor (DEXCOM G6 SENSOR) MISC INJECT 1 SENSOR INTO SKIN EVERY 10 DAYS Patient taking differently: Inject 1 Device into the skin See admin instructions. Place 1 new device into the skin every 10 days 07/18/22   Excell Seltzer, MD  Continuous Blood Gluc Transmit (DEXCOM G6 TRANSMITTER) MISC USE AS DIRECTED FOR CONTINUOUS GLUCOSE MONITORING. REUSE TRANSMITTER X 90DAYS THEN DISCARD & REPLACE 07/05/21   Excell Seltzer, MD  Glucagon, rDNA, (GLUCAGON  EMERGENCY) 1 MG KIT Inject 1 mg into the skin See admin instructions. Inject one mg into the skin see administration instructions. Follow package directions for low blood sugar. 06/18/23   [provider]  Insulin Pen Needle (PEN NEEDLES) 31G X 8 MM MISC Use to inject insulin 4 times a day.  Dx: E11.10 07/21/21   Excell Seltzer, MD  Lancets (ONETOUCH ULTRASOFT) lancets Use to check blood sugar daily as needed.  Dx: E11.10 12/23/17   Excell Seltzer, MD  levothyroxine (SYNTHROID) 137 MCG tablet Take 137 mcg by mouth daily before breakfast. 03/19/22   [provider]  metoCLOPramide (REGLAN) 5 MG tablet Take 1 tablet (5 mg total) by mouth 3 (three) times daily before meals. Patient not taking: Reported on 08/01/2023 02/06/23   Albertine Grates, MD  midodrine (PROAMATINE) 5 MG tablet Take 1 tablet (5 mg total) by  mouth 2 (two) times daily with a meal. Hold if your systolic blood pressure  ( top number) is > , please check your blood pressure at home once a day and bring in record for your doctor to review, follow up with your pcp and cardiology regarding your blood pressure medication Patient taking differently: Take 5 mg by mouth See admin instructions. Take 5 mg by mouth in the morning and hold if the Systolic number is greater than 150 02/06/23   Albertine Grates, MD  NOVOLOG FLEXPEN 100 UNIT/ML FlexPen Inject 4 Units into the skin See admin instructions. Inject 4 units into the skin three times a day after meals, PLUS sliding scale coverage    [provider]  pantoprazole (PROTONIX) 40 MG tablet Take 1 tablet (40 mg total) by mouth 2 (two) times daily. Patient not taking: Reported on 06/14/2023 02/06/23 06/14/23  Albertine Grates, MD  ranolazine (RANEXA) 500 MG 12 hr tablet Take 500 mg by mouth 2 (two) times daily. 07/23/23   [provider]  rosuvastatin (CRESTOR) 10 MG tablet Take 10 mg by mouth at bedtime. 06/17/23   [provider]  TOUJEO SOLOSTAR 300 UNIT/ML Solostar Pen Inject 6 Units into the skin at bedtime. Patient taking differently: Inject 12 Units into the skin at bedtime. 03/29/22   Regalado, Belkys A, MD  zolpidem (AMBIEN) 5 MG tablet Take 2.5 mg by mouth at bedtime as needed for sleep.    [provider]    Physical Exam: Vitals:   08/22/23 2032 08/22/23 2122 08/22/23 2200 08/22/23 2215  BP:   (!) 139/116 (!) 137/103  Pulse:   (!) 103 (!) 117  Resp:   17 17  Temp:  97.8 F (36.6 C)    TempSrc:      SpO2:   100% 100%  Weight: 55.3 kg      Constitutional: Resting in bed with head elevated, appears uncomfortable Eyes: EOMI, lids and conjunctivae normal ENMT: Mucous membranes are dry. Posterior pharynx clear of any exudate or lesions.poor dentition.  Neck: normal, supple, no masses. Respiratory: clear to auscultation bilaterally, no wheezing, no crackles. Normal  respiratory effort. No accessory muscle use.  Cardiovascular: Regular rate and rhythm, no murmurs / rubs / gallops. No extremity edema. 2+ pedal pulses. Abdomen: Generalized tenderness, no masses palpated.  Musculoskeletal: no clubbing / cyanosis. No joint deformity upper and lower extremities. Good ROM, no contractures. Normal muscle tone.  Skin: no rashes, lesions, ulcers. No induration Neurologic: Sensation intact. Strength 5/5 in all 4.  Psychiatric: Alert and oriented x 3. Normal mood.   EKG: Ordered and pending.  Assessment/Plan Principal Problem:   Diabetic ketoacidosis associated with type 2 diabetes mellitus ([name]) Active Problems:   Acute on chronic urinary retention   Essential hypertension   Hypothyroidism   Hyperlipidemia   Lucendia C Jasper is a 62 y.o. female with medical history significant for insulin-dependent T2DM with recurrent DKA, HTN, HLD, hypothyroidism, marijuana use who is admitted with DKA.  Assessment and Plan: Diabetic ketoacidosis associated with insulin-dependent type 2 diabetes: Appears to have borderline DKA is likely etiology of presenting symptoms.  Serum glucose >400 with elevated anion gap and beta hydroxybutyrate.  Initially treated as a sepsis on ED arrival however tachycardia and lactic acidosis can be attributed to DKA.  Leukocytosis likely from hemoconcentration.  Sepsis not present on arrival. -Continue IV insulin infusion per protocol -Continue IV fluids as ordered -Follow serial BMET and transition to subcutaneous insulin when able  Acute on chronic chronic urinary retention: Ongoing issue noted on previous admissions.  Patient retaining urine again on admission which likely reflect the CT findings of thick-walled bladder.  UA not convincing for UTI.  Bladder scan in ED showed 999 cc urine.  In-N-Out cath with total 1450 cc UOP. -Hold further antibiotics, follow urine culture -Bladder scan and In-N-Out cath as needed -May need Foley if  persistent retention  Hypertension: BP elevated on admission.  Not currently on antihypertensives at home but is prescribed midodrine. -Hold midodrine -IV labetalol as needed  Hypothyroidism: Continue Synthroid.  Hyperlipidemia: Per med rec patient is taking both atorvastatin and rosuvastatin.  Will need to verify and consolidate to a single statin.   DVT prophylaxis: enoxaparin (LOVENOX) injection 40 mg Start: 08/22/23 2200 Code Status: Full code, confirmed with patient on admission Family Communication: Discussed with patient, she has discussed with family Disposition Plan: From home and likely discharge to home pending clinical progress Consults called: None Severity of Illness: The appropriate patient status for this patient is INPATIENT. Inpatient status is judged to be reasonable and necessary in order to provide the required intensity of service to ensure the patient's safety. The patient's presenting symptoms, physical exam findings, and initial radiographic and laboratory data in the context of their chronic comorbidities is felt to place them at high risk for further clinical deterioration. Furthermore, it is not anticipated that the patient will be medically stable for discharge from the hospital within 2 midnights of admission.   * I certify that at the point of admission it is my clinical judgment that the patient will require inpatient hospital care spanning beyond 2 midnights from the point of admission due to high intensity of service, high risk for further deterioration and high frequency of surveillance required.Darreld Mclean MD [facility]  If 7PM-7AM, please contact night-coverage [email or web address]  08/22/2023, 11:11 PM

## 2023-08-22 NOTE — ED Notes (Signed)
Pt in CT.

## 2023-08-22 NOTE — ED Notes (Signed)
Ultrasound guided IV was placed to obtain set of blood cultures and repeat lactic. This pt had 2nd IV access attempted twice without success before ultrasound use.

## 2023-08-22 NOTE — Progress Notes (Signed)
Elink is following code sepsis 

## 2023-08-22 NOTE — ED Notes (Signed)
BS-365, per Endotool no rate change to Insulin. Will continue to monitor until next check @2143 .

## 2023-08-23 DIAGNOSIS — R339 Retention of urine, unspecified: Secondary | ICD-10-CM

## 2023-08-23 DIAGNOSIS — E1110 Type 2 diabetes mellitus with ketoacidosis without coma: Secondary | ICD-10-CM | POA: Diagnosis not present

## 2023-08-23 LAB — BASIC METABOLIC PANEL
Anion gap: 11 (ref 5–15)
BUN: 12 mg/dL (ref 6–20)
CO2: 28 mmol/L (ref 22–32)
Calcium: 9 mg/dL (ref 8.9–10.3)
Chloride: 97 mmol/L — ABNORMAL LOW (ref 98–111)
Creatinine, Ser: 0.53 mg/dL (ref 0.44–1.00)
GFR, Estimated: >60 mL/min (ref >60)
Glucose, Bld: 156 mg/dL — ABNORMAL HIGH (ref 70–99)
Potassium: 3.5 mmol/L (ref 3.5–5.1)
Sodium: 136 mmol/L (ref 135–145)

## 2023-08-23 LAB — CBC
HCT: 41.2 % (ref 36.0–46.0)
Hemoglobin: 14 g/dL (ref 12.0–15.0)
MCH: 29.6 pg (ref 26.0–34.0)
MCHC: 34 g/dL (ref 30.0–36.0)
MCV: 87.1 fL (ref 80.0–100.0)
Platelets: 352 10*3/uL (ref 150–400)
RBC: 4.73 MIL/uL (ref 3.87–5.11)
RDW: 14.8 % (ref 11.5–15.5)
WBC: 15.5 10*3/uL — ABNORMAL HIGH (ref 4.0–10.5)
nRBC: 0 % (ref 0.0–0.2)

## 2023-08-23 LAB — URINE CULTURE: Culture: >=100000 — AB

## 2023-08-23 LAB — GLUCOSE, CAPILLARY
Glucose-Capillary: 183 mg/dL — ABNORMAL HIGH (ref 70–99)
Glucose-Capillary: 87 mg/dL (ref 70–99)
Glucose-Capillary: 145 mg/dL — ABNORMAL HIGH (ref 70–99)
Glucose-Capillary: 218 mg/dL — ABNORMAL HIGH (ref 70–99)
Glucose-Capillary: 158 mg/dL — ABNORMAL HIGH (ref 70–99)
Glucose-Capillary: 131 mg/dL — ABNORMAL HIGH (ref 70–99)

## 2023-08-23 LAB — BETA-HYDROXYBUTYRIC ACID
Beta-Hydroxybutyric Acid: 3.18 mmol/L — ABNORMAL HIGH (ref 0.05–0.27)
Beta-Hydroxybutyric Acid: 0.1 mmol/L (ref 0.05–0.27)

## 2023-08-23 LAB — CBG MONITORING, ED
Glucose-Capillary: 139 mg/dL — ABNORMAL HIGH (ref 70–99)
Glucose-Capillary: 182 mg/dL — ABNORMAL HIGH (ref 70–99)
Glucose-Capillary: 203 mg/dL — ABNORMAL HIGH (ref 70–99)

## 2023-08-23 LAB — MRSA NEXT GEN BY PCR, NASAL: MRSA by PCR Next Gen: NOT DETECTED

## 2023-08-23 MED ORDER — HYDROMORPHONE HCL 1 MG/ML IJ SOLN
0.5000 mg | INTRAMUSCULAR | Status: DC | PRN
  Administered 2023-08-23 (×3): 0.5 mg via INTRAVENOUS
  Filled 2023-08-23 (×2): qty 0.5
  Filled 2023-08-23: qty 1

## 2023-08-23 MED ORDER — INSULIN ASPART 100 UNIT/ML IJ SOLN: 0.0000 [IU] | Freq: Every day | INTRAMUSCULAR | Status: DC

## 2023-08-23 MED ORDER — LACTATED RINGERS IV SOLN: INTRAVENOUS | Status: DC

## 2023-08-23 MED ORDER — CHLORHEXIDINE GLUCONATE CLOTH 2 % EX PADS: 6.0000 | MEDICATED_PAD | Freq: Every day | CUTANEOUS | Status: DC

## 2023-08-23 MED ORDER — ORAL CARE MOUTH RINSE: 15.0000 mL | OROMUCOSAL | Status: DC | PRN

## 2023-08-23 MED ORDER — INFLUENZA VIRUS VACC SPLIT PF (FLUZONE) 0.5 ML IM SUSY: 0.5000 mL | PREFILLED_SYRINGE | INTRAMUSCULAR | Status: DC

## 2023-08-23 MED ORDER — METOCLOPRAMIDE HCL 5 MG/ML IJ SOLN
10.0000 mg | Freq: Four times a day (QID) | INTRAMUSCULAR | Status: AC
  Administered 2023-08-23 – 2023-08-25 (×8): 10 mg via INTRAVENOUS
  Filled 2023-08-23 (×7): qty 2

## 2023-08-23 MED ORDER — INSULIN GLARGINE-YFGN 100 UNIT/ML ~~LOC~~ SOLN
8.0000 [IU] | SUBCUTANEOUS | Status: DC
  Administered 2023-08-23 – 2023-08-24 (×2): 8 [IU] via SUBCUTANEOUS
  Filled 2023-08-23 (×2): qty 0.08

## 2023-08-23 MED ORDER — POTASSIUM CHLORIDE CRYS ER 20 MEQ PO TBCR: 40.0000 meq | EXTENDED_RELEASE_TABLET | Freq: Once | ORAL | Status: DC

## 2023-08-23 MED ORDER — INFLUENZA VIRUS VACC SPLIT PF (FLUZONE) 0.5 ML IM SUSY
0.5000 mL | PREFILLED_SYRINGE | INTRAMUSCULAR | Status: AC | PRN
  Administered 2023-08-25: 0.5 mL via INTRAMUSCULAR
  Filled 2023-08-23 (×2): qty 0.5

## 2023-08-23 MED ORDER — INSULIN ASPART 100 UNIT/ML IJ SOLN
0.0000 [IU] | Freq: Three times a day (TID) | INTRAMUSCULAR | Status: DC
  Administered 2023-08-23: 5 [IU] via SUBCUTANEOUS
  Administered 2023-08-23: 3 [IU] via SUBCUTANEOUS
  Administered 2023-08-24 (×2): 5 [IU] via SUBCUTANEOUS
  Administered 2023-08-25: 3 [IU] via SUBCUTANEOUS
  Administered 2023-08-25: 5 [IU] via SUBCUTANEOUS

## 2023-08-23 MED ORDER — ROSUVASTATIN CALCIUM 10 MG PO TABS
10.0000 mg | ORAL_TABLET | Freq: Every day | ORAL | Status: DC
  Administered 2023-08-23 – 2023-08-24 (×2): 10 mg via ORAL
  Filled 2023-08-23 (×2): qty 1

## 2023-08-23 MED ORDER — CHLORHEXIDINE GLUCONATE CLOTH 2 % EX PADS
6.0000 | MEDICATED_PAD | Freq: Every day | CUTANEOUS | Status: DC
  Administered 2023-08-23: 6 via TOPICAL

## 2023-08-23 NOTE — ED Notes (Signed)
ED TO INPATIENT HANDOFF REPORT  S Name/Age/Gender [name] [name] 62 y.o. female Room/Bed: WA22/WA22  Code Status   Code Status: Full Code  Home/SNF/Other Home Patient oriented to: self, place, time, and situation Is this baseline? Yes   Triage Complete: Triage complete  Chief Complaint Diabetic ketoacidosis associated with type 2 diabetes mellitus ([name]) [E11.10]  Triage Note Pt arriving via [facility] for abdominal pain, N/V. Pt believes she may have a kidney stone. Pt received 4mg  Zofran, Fentanyl, and NS via EMS. Pt A&O x4.    Allergies Allergies  Allergen Reactions   Codeine Hives, Anxiety and Other (See Comments)    Level of Care/Admitting Diagnosis ED Disposition     ED Disposition  Admit   Condition  --   Comment  Hospital Area: [facility] [[id number]]  Level of Care: Stepdown [14]  Admit to SDU based on following criteria: Severe physiological/psychological symptoms:  Any diagnosis requiring assessment & intervention at least every 4 hours on an ongoing basis to obtain desired patient outcomes including stability and rehabilitation  May admit patient to [facility] or [facility] if equivalent level of care is available:: No  Covid Evaluation: Asymptomatic - no recent exposure (last 10 days) testing not required  Diagnosis: Diabetic ketoacidosis associated with type 2 diabetes mellitus [facility]) [[id number]]  Admitting Physician: Charlsie Quest [[id number]]  Attending Physician: Charlsie Quest [[id number]]  Certification:: I certify this patient will need inpatient services for at least 2 midnights  Expected Medical Readiness: 08/24/2023          B Medical/Surgery History Past Medical History:  Diagnosis Date   Allergy    seasonal   Anxiety    B12 deficiency    Chest pain 04/05/2017   CHF (congestive heart failure) ([name])    Diabetes mellitus type 1, uncontrolled    "dr. 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) 10/29/2013   Leg cramping    "@ night" (09/18/2013)   Migraine    "once q couple months" (11/26/2018)   Osteoporosis    Peripheral neuropathy    Umbilical hernia    unrepaired (09/18/2013)   Past Surgical History:  Procedure Laterality Date   BIOPSY  06/03/2019   Procedure: BIOPSY;  Surgeon: Lynann Bologna, MD;  Location: [facility];  Service: Endoscopy;;   BIOPSY  08/17/2020   Procedure: BIOPSY;  Surgeon: Tressia Danas, MD;  Location: [facility];  Service: Gastroenterology;;   BIOPSY  02/05/2023   Procedure: BIOPSY;  Surgeon: Meryl Dare, MD;  Location: [facility];  Service: Gastroenterology;;   CESAREAN SECTION  1989   ENTEROSCOPY N/A 06/03/2019   Procedure: ENTEROSCOPY;  Surgeon: Lynann Bologna, MD;  Location: [facility];  Service: Endoscopy;  Laterality: N/A;   ESOPHAGOGASTRODUODENOSCOPY (EGD) WITH PROPOFOL N/A 08/17/2020   Procedure: ESOPHAGOGASTRODUODENOSCOPY (EGD) WITH PROPOFOL;  Surgeon: Tressia Danas, MD;  Location: [facility];  Service: Gastroenterology;  Laterality: N/A;   ESOPHAGOGASTRODUODENOSCOPY (EGD) WITH PROPOFOL N/A 02/05/2023   Procedure: ESOPHAGOGASTRODUODENOSCOPY (EGD) WITH PROPOFOL;  Surgeon: Meryl Dare, MD;  Location: [facility];  Service: Gastroenterology;  Laterality: N/A;   LAPAROSCOPIC ASSISTED VAGINAL HYSTERECTOMY  09/23/2012   Procedure: LAPAROSCOPIC ASSISTED VAGINAL HYSTERECTOMY;  Surgeon: Mitchel Honour, DO;  Location: [facility];  Service: Gynecology;  Laterality: N/A;  pull Dr Vance Gather instrument   LEFT HEART CATH AND CORONARY ANGIOGRAPHY N/A 02/11/2017   Procedure: Left Heart Cath and Coronary Angiography;  Surgeon: Runell Gess, MD;  Location: [facility] INVAS[facility] CV LAB;  Service: Cardiovascular;   Laterality: N/A;   TUBAL LIGATION Bilateral 1992     A IV Location/Drains/Wounds Patient Lines/Drains/Airways Status     Active Line/Drains/Airways     Name Placement date Placement time Site Days   Peripheral IV 08/22/23 22 G Posterior;Right Hand 08/22/23  --  Hand  1   Peripheral IV 08/22/23 20 G Anterior;Left;Upper Arm 08/22/23  1720  Arm  1            Intake/Output Last 24 hours  Intake/Output Summary (Last 24 hours) at 08/23/2023 0242 Last data filed at 08/22/2023 1530 Gross per 24 hour  Intake 400 ml  Output 1450 ml  Net -1050 ml    Labs/Imaging Results for orders placed or performed during the hospital encounter of 08/22/23 (from the past 48 hour(s))  Comprehensive metabolic panel     Status: Abnormal   Collection Time: 08/22/23  2:41 PM  Result Value Ref Range   Sodium 137 135 - 145 mmol/L   Potassium 3.8 3.5 - 5.1 mmol/L   Chloride 97 (L) 98 - 111 mmol/L   CO2 20 (L) 22 - 32 mmol/L   Glucose, Bld 445 (H) 70 - 99 mg/dL    Comment: Glucose reference range applies only to samples taken after fasting for at least 8 hours.   BUN 20 6 - 20 mg/dL   Creatinine, Ser 1.61 0.44 - 1.00 mg/dL   Calcium 9.5 8.9 - 09.6 mg/dL   Total Protein 8.9 (H) 6.5 - 8.1 g/dL   Albumin 4.9 3.5 - 5.0 g/dL   AST 20 15 - 41 U/L   ALT 16 0 - 44 U/L   Alkaline Phosphatase 89 38 - 126 U/L   Total Bilirubin 1.1 0.3 - 1.2 mg/dL   GFR, Estimated >04 >54 mL/min    Comment: (NOTE) Calculated using the CKD-EPI Creatinine Equation (2021)    Anion gap 20 (H) 5 - 15    Comment: Performed at [facility], [address] [place], [place] [address]  Lipase, blood     Status: None   Collection Time: 08/22/23  2:41 PM  Result Value Ref Range   Lipase 40 11 - 51 U/L    Comment: Performed at [facility], [address] [place], [place] [address]  CBC with Differential     Status: Abnormal   Collection Time: 08/22/23  2:41 PM  Result Value Ref Range   WBC  15.8 (H) 4.0 - 10.5 K/uL   RBC 5.68 (H) 3.87 - 5.11 MIL/uL   Hemoglobin 16.4 (H) 12.0 - 15.0 g/dL   HCT 29.5 (H) 62.1 - 30.8 %   MCV 87.9 80.0 - 100.0 fL   MCH 28.9 26.0 - 34.0 pg   MCHC 32.9 30.0 - 36.0 g/dL   RDW 65.7 84.6 - 96.2 %   Platelets 410 (H) 150 - 400 K/uL   nRBC 0.0 0.0 - 0.2 %   Neutrophils Relative % 83 %   Neutro Abs 13.1 (H) 1.7 - 7.7 K/uL   Lymphocytes Relative 13 %   Lymphs Abs 2.0 0.7 - 4.0 K/uL   Monocytes Relative 3 %   Monocytes Absolute 0.5 0.1 - 1.0 K/uL   Eosinophils Relative  0 %   Eosinophils Absolute 0.0 0.0 - 0.5 K/uL   Basophils Relative 0 %   Basophils Absolute 0.1 0.0 - 0.1 K/uL   Immature Granulocytes 1 %   Abs Immature Granulocytes 0.12 (H) 0.00 - 0.07 K/uL    Comment: Performed at [facility], [address], [place], [place] [address]  Blood gas, venous     Status: Abnormal   Collection Time: 08/22/23  2:41 PM  Result Value Ref Range   pH, Ven 7.34 7.25 - 7.43   pCO2, Ven 45 44 - 60 mmHg   pO2, Ven 53 (H) 32 - 45 mmHg   Bicarbonate 24.3 20.0 - 28.0 mmol/L   Acid-base deficit 1.7 0.0 - 2.0 mmol/L   O2 Saturation 85 %   Patient temperature 37.0     Comment: Performed at [facility], [address] [place], [place] [address]  Beta-hydroxybutyric acid     Status: Abnormal   Collection Time: 08/22/23  2:41 PM  Result Value Ref Range   Beta-Hydroxybutyric Acid 2.77 (H) 0.05 - 0.27 mmol/L    Comment: Performed at [facility], [address] [place], [place] [address]  I-Stat Lactic Acid     Status: Abnormal   Collection Time: 08/22/23  3:05 PM  Result Value Ref Range   Lactic Acid, Venous 4.4 (HH) 0.5 - 1.9 mmol/L   Comment NOTIFIED PHYSICIAN   I-stat chem 8, ED     Status: Abnormal   Collection Time: 08/22/23  3:06 PM  Result Value Ref Range   Sodium 136 135 - 145 mmol/L   Potassium 4.7 3.5 - 5.1 mmol/L   Chloride 100 98 - 111 mmol/L   BUN 25 (H) 6 - 20 mg/dL   Creatinine,  Ser 5.78 (L) 0.44 - 1.00 mg/dL   Glucose, Bld 469 (H) 70 - 99 mg/dL    Comment: Glucose reference range applies only to samples taken after fasting for at least 8 hours.   Calcium, Ion 1.07 (L) 1.15 - 1.40 mmol/L   TCO2 24 22 - 32 mmol/L   Hemoglobin 16.7 (H) 12.0 - 15.0 g/dL   HCT 62.9 (H) 52.8 - 41.3 %  Urinalysis, Routine w reflex microscopic -Urine, Clean Catch     Status: Abnormal   Collection Time: 08/22/23  3:39 PM  Result Value Ref Range   Color, Urine YELLOW YELLOW   APPearance HAZY (A) CLEAR   Specific Gravity, Urine 1.025 1.005 - 1.030   pH 7.0 5.0 - 8.0   Glucose, UA >=500 (A) NEGATIVE mg/dL   Hgb urine dipstick NEGATIVE NEGATIVE   Bilirubin Urine NEGATIVE NEGATIVE   Ketones, ur 20 (A) NEGATIVE mg/dL   Protein, ur 30 (A) NEGATIVE mg/dL   Nitrite NEGATIVE NEGATIVE   Leukocytes,Ua NEGATIVE NEGATIVE   RBC / HPF 0-5 0 - 5 RBC/hpf   WBC, UA 0-5 0 - 5 WBC/hpf   Bacteria, UA RARE (A) NONE SEEN   Squamous Epithelial / HPF 0-5 0 - 5 /HPF   Mucus PRESENT    Amorphous Crystal PRESENT     Comment: Performed at [facility], [address] [place], [place] [address]  I-Stat Lactic Acid     Status: Abnormal   Collection Time: 08/22/23  5:35 PM  Result Value Ref Range   Lactic Acid, Venous 2.0 (HH) 0.5 - 1.9 mmol/L   Comment NOTIFIED PHYSICIAN   CBG monitoring, ED     Status: Abnormal   Collection  Time: 08/22/23  7:09 PM  Result Value Ref Range   Glucose-Capillary 374 (H) 70 - 99 mg/dL    Comment: Glucose reference range applies only to samples taken after fasting for at least 8 hours.  CBG monitoring, ED     Status: Abnormal   Collection Time: 08/22/23  8:50 PM  Result Value Ref Range   Glucose-Capillary 365 (H) 70 - 99 mg/dL    Comment: Glucose reference range applies only to samples taken after fasting for at least 8 hours.  Basic metabolic panel     Status: Abnormal   Collection Time: 08/22/23  9:06 PM  Result Value Ref Range   Sodium 136 135 - 145  mmol/L   Potassium 2.9 (L) 3.5 - 5.1 mmol/L   Chloride 97 (L) 98 - 111 mmol/L   CO2 21 (L) 22 - 32 mmol/L   Glucose, Bld 357 (H) 70 - 99 mg/dL    Comment: Glucose reference range applies only to samples taken after fasting for at least 8 hours.   BUN 16 6 - 20 mg/dL   Creatinine, Ser 1.61 0.44 - 1.00 mg/dL   Calcium 9.3 8.9 - 09.6 mg/dL   GFR, Estimated >04 >54 mL/min    Comment: (NOTE) Calculated using the CKD-EPI Creatinine Equation (2021)    Anion gap 18 (H) 5 - 15    Comment: Performed at [facility], [address], [place], [place] [address]  Beta-hydroxybutyric acid     Status: Abnormal   Collection Time: 08/22/23  9:06 PM  Result Value Ref Range   Beta-Hydroxybutyric Acid 3.18 (H) 0.05 - 0.27 mmol/L    Comment: Performed at [facility], [address], [place], [place] [address]  CBC with Differential (PNL)     Status: Abnormal   Collection Time: 08/22/23  9:06 PM  Result Value Ref Range   WBC 13.1 (H) 4.0 - 10.5 K/uL   RBC 5.22 (H) 3.87 - 5.11 MIL/uL   Hemoglobin 15.2 (H) 12.0 - 15.0 g/dL   HCT 29.5 62.1 - 30.8 %   MCV 87.2 80.0 - 100.0 fL   MCH 29.1 26.0 - 34.0 pg   MCHC 33.4 30.0 - 36.0 g/dL   RDW 65.7 84.6 - 96.2 %   Platelets 404 (H) 150 - 400 K/uL   nRBC 0.0 0.0 - 0.2 %   Neutrophils Relative % 87 %   Neutro Abs 11.4 (H) 1.7 - 7.7 K/uL   Lymphocytes Relative 10 %   Lymphs Abs 1.3 0.7 - 4.0 K/uL   Monocytes Relative 2 %   Monocytes Absolute 0.3 0.1 - 1.0 K/uL   Eosinophils Relative 0 %   Eosinophils Absolute 0.0 0.0 - 0.5 K/uL   Basophils Relative 0 %   Basophils Absolute 0.0 0.0 - 0.1 K/uL   Immature Granulocytes 1 %   Abs Immature Granulocytes 0.07 0.00 - 0.07 K/uL    Comment: Performed at [facility], [address], [place], [place] [address]  Blood gas, venous     Status: Abnormal   Collection Time: 08/22/23  9:06 PM  Result Value Ref Range   pH, Ven 7.35 7.25 - 7.43   pCO2, Ven 42 (L) 44 -  60 mmHg   pO2, Ven 46 (H) 32 - 45 mmHg   Bicarbonate 23.2 20.0 - 28.0 mmol/L   Acid-base deficit 2.4 (H) 0.0 - 2.0 mmol/L   O2 Saturation 78.5 %   Patient temperature 37.0  Comment: Performed at [facility], [address], [place], [place] [address]  CBG monitoring, ED     Status: Abnormal   Collection Time: 08/22/23 10:01 PM  Result Value Ref Range   Glucose-Capillary 238 (H) 70 - 99 mg/dL    Comment: Glucose reference range applies only to samples taken after fasting for at least 8 hours.  CBG monitoring, ED     Status: Abnormal   Collection Time: 08/22/23 11:45 PM  Result Value Ref Range   Glucose-Capillary 190 (H) 70 - 99 mg/dL    Comment: Glucose reference range applies only to samples taken after fasting for at least 8 hours.  CBG monitoring, ED     Status: Abnormal   Collection Time: 08/23/23 12:50 AM  Result Value Ref Range   Glucose-Capillary 203 (H) 70 - 99 mg/dL    Comment: Glucose reference range applies only to samples taken after fasting for at least 8 hours.  CBG monitoring, ED     Status: Abnormal   Collection Time: 08/23/23  1:52 AM  Result Value Ref Range   Glucose-Capillary 182 (H) 70 - 99 mg/dL    Comment: Glucose reference range applies only to samples taken after fasting for at least 8 hours.   CT ABDOMEN PELVIS W CONTRAST  Result Date: 08/22/2023 CLINICAL DATA:  Abdomen pain nausea vomiting EXAM: CT ABDOMEN AND PELVIS WITH CONTRAST TECHNIQUE: Multidetector CT imaging of the abdomen and pelvis was performed using the standard protocol following bolus administration of intravenous contrast. RADIATION DOSE REDUCTION: This exam was performed according to the departmental dose-optimization program which includes automated exposure control, adjustment of the mA and/or kV according to patient size and/or use of iterative reconstruction technique. CONTRAST:  OMNIPAQUE IOHEXOL 300 MG/ML  SOLN COMPARISON:  CT 08/01/2023, 06/14/2023, 05/14/2023,  and prior exams dating back to 06/03/2017 FINDINGS: Lower chest: Lung bases demonstrate no acute airspace disease. Hepatobiliary: Hepatic steatosis. No calcified gallstone. Stable thickening at the gallbladder fundus compared with multiple priors and likely due to adenomyomatosis. No biliary dilatation Pancreas: Unremarkable. No pancreatic ductal dilatation or surrounding inflammatory changes. Spleen: Normal in size without focal abnormality. Adrenals/Urinary Tract: Adrenal glands are unremarkable. Kidneys are normal, without renal calculi, focal lesion, or hydronephrosis. The bladder is diffusely thick walled in appearance. Stomach/Bowel: Stomach is nonenlarged. There is no dilated small bowel. Colon diverticular disease. Collapsed appearance versus mild colitis type changes of the distal descending and sigmoid colon. Negative appendix Vascular/Lymphatic: Mild aortic atherosclerosis. No aneurysm. No suspicious lymph nodes. Reproductive: Status post hysterectomy. No adnexal masses. Other: Negative for pelvic effusion or free air. Small fat containing umbilical hernia Musculoskeletal: No acute or suspicious osseous abnormality. IMPRESSION: 1. Collapsed appearance versus mild colitis type changes of the distal descending and sigmoid colon. 2. Diffusely thick walled appearance of the bladder, cystitis versus changes related to chronic obstruction. 3. Hepatic steatosis. 4. Aortic atherosclerosis. Aortic Atherosclerosis (ICD10-I70.0). Electronically Signed   By: Jasmine Pang M.D.   On: 08/22/2023 17:15    Pending Labs Unresulted Labs (From admission, onward)     Start     Ordered   08/23/23 0500  CBC  Tomorrow morning,   R        08/22/23 2312   08/23/23 0219  MRSA Next Gen by PCR, Nasal  Once,   R        08/23/23 0219   08/22/23 2023  Hemoglobin A1c  Once,   R        08/22/23 2023  08/22/23 2001  Urine Culture (for pregnant, neutropenic or urologic patients or patients with an indwelling urinary catheter)   (Urine Labs)  Add-on,   AD       Question:  Indication  Answer:  Dysuria   08/22/23 2000   08/22/23 1832  Basic metabolic panel  (Diabetes Ketoacidosis (DKA))  STAT Now then every 4 hours ,   STAT      08/22/23 1831   08/22/23 1832  Beta-hydroxybutyric acid  (Diabetes Ketoacidosis (DKA))  Now then every 8 hours,   STAT (with TIMED, URGENT occurrences)      08/22/23 1831   08/22/23 1531  Culture, blood (single)  (Undifferentiated -> Now sepsis confirmed (treatment and sepsis specific nursing orders))  ONCE - STAT,   STAT        08/22/23 1531            Vitals/Pain Today's Vitals   08/22/23 2315 08/22/23 2330 08/23/23 0105 08/23/23 0200  BP:   (!) 154/94 (!) 142/76  Pulse:   99 92  Resp: 16  19 17   Temp:   98.1 F (36.7 C)   TempSrc:      SpO2:  97% 98% 97%  Weight:      PainSc:        Isolation Precautions No active isolations  Medications Medications  ondansetron (ZOFRAN) injection 4 mg (4 mg Intravenous Given 08/22/23 1746)  insulin regular, human (MYXREDLIN) 100 units/ 100 mL infusion (2.4 Units/hr Intravenous Rate/Dose Change 08/23/23 0157)  lactated ringers infusion (0 mLs Intravenous Stopped 08/22/23 2214)  dextrose 5 % in lactated ringers infusion ( Intravenous New Bag/Given 08/22/23 2220)  dextrose 50 % solution 0-50 mL (has no administration in time range)  promethazine (PHENERGAN) 12.5 mg in sodium chloride 0.9 % 50 mL IVPB (12.5 mg Intravenous New Bag/Given 08/22/23 2154)  enoxaparin (LOVENOX) injection 40 mg (40 mg Subcutaneous Given 08/22/23 2144)  acetaminophen (TYLENOL) tablet 1,000 mg (has no administration in time range)  HYDROmorphone (DILAUDID) injection 0.5 mg (0.5 mg Intravenous Given 08/22/23 2149)  labetalol (NORMODYNE) injection 10 mg (has no administration in time range)  levothyroxine (SYNTHROID) tablet 137 mcg (has no administration in time range)  pantoprazole (PROTONIX) EC tablet 40 mg (has no administration in time range)  potassium chloride 10 mEq  in 100 mL IVPB (10 mEq Intravenous New Bag/Given 08/23/23 0149)  Chlorhexidine Gluconate Cloth 2 % PADS 6 each (has no administration in time range)  ondansetron (ZOFRAN) injection 4 mg (4 mg Intravenous Given 08/22/23 1442)  lactated ringers bolus 1,000 mL (0 mLs Intravenous Stopped 08/22/23 1610)  fentaNYL (SUBLIMAZE) injection 50 mcg (50 mcg Intravenous Given 08/22/23 1443)  lactated ringers bolus 500 mL (0 mLs Intravenous Stopped 08/22/23 1745)  ceFEPIme (MAXIPIME) 2 g in sodium chloride 0.9 % 100 mL IVPB (0 g Intravenous Stopped 08/22/23 1750)  metroNIDAZOLE (FLAGYL) IVPB 500 mg (0 mg Intravenous Stopped 08/22/23 1941)  iohexol (OMNIPAQUE) 300 MG/ML solution 100 mL (100 mLs Intravenous Contrast Given 08/22/23 1540)  lactated ringers bolus 1,106 mL (1,106 mLs Intravenous Bolus 08/22/23 2054)  potassium chloride 10 mEq in 100 mL IVPB (10 mEq Intravenous New Bag/Given 08/22/23 2145)    Mobility walks      R Recommendations: See Admitting Provider Note  Report given to: Kipp Brood

## 2023-08-23 NOTE — Progress Notes (Signed)
Progress Note    [name] [name]   WUJ:[id number]  DOB: 04/07/1961  DOA: 08/22/2023     1 PCP: [name] All, NP  Initial CC: N/V, urinary retention  Hospital Course: [name] [name] is a 62 y.o. female with medical history significant for insulin-dependent T2DM with recurrent DKA, HTN, HLD, hypothyroidism, marijuana use who is admitted with DKA. She has also had difficulty with urinary retention recently requiring Foley catheter placement and evaluation with urology outpatient.  Catheter has recently been removed and she has been voiding well at home prior to hospitalization.  Upon admission, she again had acute urinary retention requiring straight cath after admission.  Interval History:  Seen this morning in the ICU appearing still slightly uncomfortable.  Still having some nausea and not wishing to advance diet yet.  Has been voiding spontaneously some this morning after straight cath since admission.  Assessment and Plan: * Diabetic ketoacidosis associated with type 2 diabetes mellitus ([name])-resolved as of 08/23/2023 - no obvious signs of infection; unclear precipitating factor - has been weaned off insulin drip this morning - continue SQ insulin  Uncontrolled type 1 diabetes mellitus with hyperglycemia, with long-term current use of insulin ([name]) - Continue Semglee and SSI - Continue CBG monitoring -Continue clear liquids for now.  She is not ready for advancing diet yet - continue LR  Acute on chronic urinary retention - follows with urology outpatient - denies constipation; had been voiding well at home prior to admission - required I&O on admission with 1.4 L output - continue bladder scans; if recurrent increased PVR, will need indwelling foley again to allow bladder rest  Essential hypertension - Elevated and uncontrolled on admission.  Also prescribed midodrine outpatient - Blood pressure currently adequate, hold off on any treatment at this time  Hypothyroidism -  Continue Synthroid  Hyperlipidemia - Resume Crestor   Old records reviewed in assessment of this patient  Antimicrobials:   DVT prophylaxis:  enoxaparin (LOVENOX) injection 40 mg Start: 08/22/23 2200   Code Status:   Code Status: Full Code  Mobility Assessment (Last 72 Hours)     Mobility Assessment   No documentation.           Barriers to discharge: none Disposition Plan:  Home Status is: Inpt  Objective: Blood pressure (!) 145/80, pulse 97, temperature 98.3 F (36.8 C), temperature source Axillary, resp. rate 14, height 4\' 11"  (1.499 m), weight 56.5 kg, last menstrual period 08/11/2012, SpO2 99%.  Examination:  Physical Exam Constitutional:      Appearance: She is well-developed.  HENT:     Head: Normocephalic and atraumatic.     Mouth/Throat:     Mouth: Mucous membranes are moist.  Eyes:     Extraocular Movements: Extraocular movements intact.  Cardiovascular:     Rate and Rhythm: Normal rate and regular rhythm.  Pulmonary:     Effort: Pulmonary effort is normal.     Breath sounds: Normal breath sounds.  Abdominal:     General: Bowel sounds are normal. There is no distension.     Palpations: Abdomen is soft.     Tenderness: There is no abdominal tenderness.  Musculoskeletal:        General: Normal range of motion.     Cervical back: Normal range of motion and neck supple.  Skin:    General: Skin is warm and dry.  Neurological:     General: No focal deficit present.     Mental Status: She is alert.  Psychiatric:        Mood and Affect: Mood normal.      Consultants:    Procedures:    Data Reviewed: Results for orders placed or performed during the hospital encounter of 08/22/23 (from the past 24 hour(s))  Comprehensive metabolic panel     Status: Abnormal   Collection Time: 08/22/23  2:41 PM  Result Value Ref Range   Sodium 137 135 - 145 mmol/L   Potassium 3.8 3.5 - 5.1 mmol/L   Chloride 97 (L) 98 - 111 mmol/L   CO2 20 (L) 22 - 32  mmol/L   Glucose, Bld 445 (H) 70 - 99 mg/dL   BUN 20 6 - 20 mg/dL   Creatinine, Ser 6.57 0.44 - 1.00 mg/dL   Calcium 9.5 8.9 - 84.6 mg/dL   Total Protein 8.9 (H) 6.5 - 8.1 g/dL   Albumin 4.9 3.5 - 5.0 g/dL   AST 20 15 - 41 U/L   ALT 16 0 - 44 U/L   Alkaline Phosphatase 89 38 - 126 U/L   Total Bilirubin 1.1 0.3 - 1.2 mg/dL   GFR, Estimated >96 >29 mL/min   Anion gap 20 (H) 5 - 15  Lipase, blood     Status: None   Collection Time: 08/22/23  2:41 PM  Result Value Ref Range   Lipase 40 11 - 51 U/L  CBC with Differential     Status: Abnormal   Collection Time: 08/22/23  2:41 PM  Result Value Ref Range   WBC 15.8 (H) 4.0 - 10.5 K/uL   RBC 5.68 (H) 3.87 - 5.11 MIL/uL   Hemoglobin 16.4 (H) 12.0 - 15.0 g/dL   HCT 52.8 (H) 41.3 - 24.4 %   MCV 87.9 80.0 - 100.0 fL   MCH 28.9 26.0 - 34.0 pg   MCHC 32.9 30.0 - 36.0 g/dL   RDW 01.0 27.2 - 53.6 %   Platelets 410 (H) 150 - 400 K/uL   nRBC 0.0 0.0 - 0.2 %   Neutrophils Relative % 83 %   Neutro Abs 13.1 (H) 1.7 - 7.7 K/uL   Lymphocytes Relative 13 %   Lymphs Abs 2.0 0.7 - 4.0 K/uL   Monocytes Relative 3 %   Monocytes Absolute 0.5 0.1 - 1.0 K/uL   Eosinophils Relative 0 %   Eosinophils Absolute 0.0 0.0 - 0.5 K/uL   Basophils Relative 0 %   Basophils Absolute 0.1 0.0 - 0.1 K/uL   Immature Granulocytes 1 %   Abs Immature Granulocytes 0.12 (H) 0.00 - 0.07 K/uL  Blood gas, venous     Status: Abnormal   Collection Time: 08/22/23  2:41 PM  Result Value Ref Range   pH, Ven 7.34 7.25 - 7.43   pCO2, Ven 45 44 - 60 mmHg   pO2, Ven 53 (H) 32 - 45 mmHg   Bicarbonate 24.3 20.0 - 28.0 mmol/L   Acid-base deficit 1.7 0.0 - 2.0 mmol/L   O2 Saturation 85 %   Patient temperature 37.0   Beta-hydroxybutyric acid     Status: Abnormal   Collection Time: 08/22/23  2:41 PM  Result Value Ref Range   Beta-Hydroxybutyric Acid 2.77 (H) 0.05 - 0.27 mmol/L  I-Stat Lactic Acid     Status: Abnormal   Collection Time: 08/22/23  3:05 PM  Result Value Ref Range    Lactic Acid, Venous 4.4 (HH) 0.5 - 1.9 mmol/L   Comment NOTIFIED PHYSICIAN   I-stat chem 8, ED     Status:  Abnormal   Collection Time: 08/22/23  3:06 PM  Result Value Ref Range   Sodium 136 135 - 145 mmol/L   Potassium 4.7 3.5 - 5.1 mmol/L   Chloride 100 98 - 111 mmol/L   BUN 25 (H) 6 - 20 mg/dL   Creatinine, Ser 1.61 (L) 0.44 - 1.00 mg/dL   Glucose, Bld 096 (H) 70 - 99 mg/dL   Calcium, Ion 0.45 (L) 1.15 - 1.40 mmol/L   TCO2 24 22 - 32 mmol/L   Hemoglobin 16.7 (H) 12.0 - 15.0 g/dL   HCT 40.9 (H) 81.1 - 91.4 %  Urinalysis, Routine w reflex microscopic -Urine, Clean Catch     Status: Abnormal   Collection Time: 08/22/23  3:39 PM  Result Value Ref Range   Color, Urine YELLOW YELLOW   APPearance HAZY (A) CLEAR   Specific Gravity, Urine 1.025 1.005 - 1.030   pH 7.0 5.0 - 8.0   Glucose, UA >=500 (A) NEGATIVE mg/dL   Hgb urine dipstick NEGATIVE NEGATIVE   Bilirubin Urine NEGATIVE NEGATIVE   Ketones, ur 20 (A) NEGATIVE mg/dL   Protein, ur 30 (A) NEGATIVE mg/dL   Nitrite NEGATIVE NEGATIVE   Leukocytes,Ua NEGATIVE NEGATIVE   RBC / HPF 0-5 0 - 5 RBC/hpf   WBC, UA 0-5 0 - 5 WBC/hpf   Bacteria, UA RARE (A) NONE SEEN   Squamous Epithelial / HPF 0-5 0 - 5 /HPF   Mucus PRESENT    Amorphous Crystal PRESENT   Culture, blood (single)     Status: None (Preliminary result)   Collection Time: 08/22/23  5:22 PM   Specimen: BLOOD  Result Value Ref Range   Specimen Description      BLOOD SITE NOT SPECIFIED Performed at [facility], [address], [place], [place] [address]    Special Requests      BOTTLES DRAWN AEROBIC AND ANAEROBIC Blood Culture results may not be optimal due to an excessive volume of blood received in culture bottles Performed at [facility], [address] [place], [place] [address]    Culture      NO GROWTH < 12 HOURS Performed at [facility], [address], [place], [place] [address]    Report Status PENDING    I-Stat Lactic Acid     Status: Abnormal   Collection Time: 08/22/23  5:35 PM  Result Value Ref Range   Lactic Acid, Venous 2.0 (HH) 0.5 - 1.9 mmol/L   Comment NOTIFIED PHYSICIAN   CBG monitoring, ED     Status: Abnormal   Collection Time: 08/22/23  7:09 PM  Result Value Ref Range   Glucose-Capillary 374 (H) 70 - 99 mg/dL  CBG monitoring, ED     Status: Abnormal   Collection Time: 08/22/23  8:50 PM  Result Value Ref Range   Glucose-Capillary 365 (H) 70 - 99 mg/dL  Basic metabolic panel     Status: Abnormal   Collection Time: 08/22/23  9:06 PM  Result Value Ref Range   Sodium 136 135 - 145 mmol/L   Potassium 2.9 (L) 3.5 - 5.1 mmol/L   Chloride 97 (L) 98 - 111 mmol/L   CO2 21 (L) 22 - 32 mmol/L   Glucose, Bld 357 (H) 70 - 99 mg/dL   BUN 16 6 - 20 mg/dL   Creatinine, Ser 4.69 0.44 - 1.00 mg/dL   Calcium 9.3 8.9 - 62.9 mg/dL   GFR, Estimated >52 >84 mL/min   Anion gap 18 (  H) 5 - 15  Beta-hydroxybutyric acid     Status: Abnormal   Collection Time: 08/22/23  9:06 PM  Result Value Ref Range   Beta-Hydroxybutyric Acid 3.18 (H) 0.05 - 0.27 mmol/L  CBC with Differential (PNL)     Status: Abnormal   Collection Time: 08/22/23  9:06 PM  Result Value Ref Range   WBC 13.1 (H) 4.0 - 10.5 K/uL   RBC 5.22 (H) 3.87 - 5.11 MIL/uL   Hemoglobin 15.2 (H) 12.0 - 15.0 g/dL   HCT 16.1 09.6 - 04.5 %   MCV 87.2 80.0 - 100.0 fL   MCH 29.1 26.0 - 34.0 pg   MCHC 33.4 30.0 - 36.0 g/dL   RDW 40.9 81.1 - 91.4 %   Platelets 404 (H) 150 - 400 K/uL   nRBC 0.0 0.0 - 0.2 %   Neutrophils Relative % 87 %   Neutro Abs 11.4 (H) 1.7 - 7.7 K/uL   Lymphocytes Relative 10 %   Lymphs Abs 1.3 0.7 - 4.0 K/uL   Monocytes Relative 2 %   Monocytes Absolute 0.3 0.1 - 1.0 K/uL   Eosinophils Relative 0 %   Eosinophils Absolute 0.0 0.0 - 0.5 K/uL   Basophils Relative 0 %   Basophils Absolute 0.0 0.0 - 0.1 K/uL   Immature Granulocytes 1 %   Abs Immature Granulocytes 0.07 0.00 - 0.07 K/uL  Blood gas, venous     Status:  Abnormal   Collection Time: 08/22/23  9:06 PM  Result Value Ref Range   pH, Ven 7.35 7.25 - 7.43   pCO2, Ven 42 (L) 44 - 60 mmHg   pO2, Ven 46 (H) 32 - 45 mmHg   Bicarbonate 23.2 20.0 - 28.0 mmol/L   Acid-base deficit 2.4 (H) 0.0 - 2.0 mmol/L   O2 Saturation 78.5 %   Patient temperature 37.0   CBG monitoring, ED     Status: Abnormal   Collection Time: 08/22/23 10:01 PM  Result Value Ref Range   Glucose-Capillary 238 (H) 70 - 99 mg/dL  CBG monitoring, ED     Status: Abnormal   Collection Time: 08/22/23 11:45 PM  Result Value Ref Range   Glucose-Capillary 190 (H) 70 - 99 mg/dL  CBG monitoring, ED     Status: Abnormal   Collection Time: 08/23/23 12:50 AM  Result Value Ref Range   Glucose-Capillary 203 (H) 70 - 99 mg/dL  CBG monitoring, ED     Status: Abnormal   Collection Time: 08/23/23  1:52 AM  Result Value Ref Range   Glucose-Capillary 182 (H) 70 - 99 mg/dL  MRSA Next Gen by PCR, Nasal     Status: None   Collection Time: 08/23/23  2:19 AM   Specimen: Nasal Mucosa; Nasal Swab  Result Value Ref Range   MRSA by PCR Next Gen NOT DETECTED NOT DETECTED  CBG monitoring, ED     Status: Abnormal   Collection Time: 08/23/23  2:55 AM  Result Value Ref Range   Glucose-Capillary 139 (H) 70 - 99 mg/dL  Basic metabolic panel     Status: Abnormal   Collection Time: 08/23/23  3:00 AM  Result Value Ref Range   Sodium 136 135 - 145 mmol/L   Potassium 3.5 3.5 - 5.1 mmol/L   Chloride 97 (L) 98 - 111 mmol/L   CO2 28 22 - 32 mmol/L   Glucose, Bld 156 (H) 70 - 99 mg/dL   BUN 12 6 - 20 mg/dL   Creatinine, Ser 7.82 0.44 -  1.00 mg/dL   Calcium 9.0 8.9 - 64.4 mg/dL   GFR, Estimated >03 >47 mL/min   Anion gap 11 5 - 15  Beta-hydroxybutyric acid     Status: None   Collection Time: 08/23/23  3:00 AM  Result Value Ref Range   Beta-Hydroxybutyric Acid 0.10 0.05 - 0.27 mmol/L  Glucose, capillary     Status: Abnormal   Collection Time: 08/23/23  3:50 AM  Result Value Ref Range   Glucose-Capillary  183 (H) 70 - 99 mg/dL  Glucose, capillary     Status: None   Collection Time: 08/23/23  7:10 AM  Result Value Ref Range   Glucose-Capillary 87 70 - 99 mg/dL  CBC     Status: Abnormal   Collection Time: 08/23/23  7:15 AM  Result Value Ref Range   WBC 15.5 (H) 4.0 - 10.5 K/uL   RBC 4.73 3.87 - 5.11 MIL/uL   Hemoglobin 14.0 12.0 - 15.0 g/dL   HCT 42.5 95.6 - 38.7 %   MCV 87.1 80.0 - 100.0 fL   MCH 29.6 26.0 - 34.0 pg   MCHC 34.0 30.0 - 36.0 g/dL   RDW 56.4 33.2 - 95.1 %   Platelets 352 150 - 400 K/uL   nRBC 0.0 0.0 - 0.2 %  Glucose, capillary     Status: Abnormal   Collection Time: 08/23/23  8:15 AM  Result Value Ref Range   Glucose-Capillary 145 (H) 70 - 99 mg/dL   Comment 1 Notify RN    Comment 2 Document in Chart   Glucose, capillary     Status: Abnormal   Collection Time: 08/23/23 12:24 PM  Result Value Ref Range   Glucose-Capillary 218 (H) 70 - 99 mg/dL    I have reviewed pertinent nursing notes, vitals, labs, and images as necessary. I have ordered labwork to follow up on as indicated.  I have reviewed the last notes from staff over past 24 hours. I have discussed patient's care plan and test results with nursing staff, CM/SW, and other staff as appropriate.  Time spent: Greater than 50% of the 55 minute visit was spent in counseling/coordination of care for the patient as laid out in the A&P.   LOS: 1 day   Lewie Chamber, MD [facility] 08/23/2023, 1:01 PM

## 2023-08-23 NOTE — Assessment & Plan Note (Signed)
Continue Synthroid

## 2023-08-23 NOTE — Assessment & Plan Note (Signed)
-   Elevated and uncontrolled on admission.  Also prescribed midodrine outpatient - Blood pressure currently adequate, hold off on any treatment at this time

## 2023-08-23 NOTE — Assessment & Plan Note (Addendum)
-   Tolerated diet advancement - Continue home regimen

## 2023-08-23 NOTE — TOC CM/SW Note (Addendum)
Transition of Care [facility]) - Inpatient Brief Assessment   Patient Details  Name: [name] [name] MRN: [id number] Date of Birth: May 24, 1961  [facility]) CM/SW Contact:    Howell Rucks, RN Phone Number: 08/23/2023, 12:21 PM   Clinical Narrative:Met with pt at  bedside to introduce role of TOC/NCM and review for dc planning. Pt reports she has a PCP and pharmacy in place, no  current home care services or home DME, pt reports she feels  safe returning home with support from her family, confirms transportation available at discharge. TOC Brief Assessment completed. No TOC needs identified at this time.     Transition of Care Asessment: Insurance and Status: Insurance coverage has been reviewed Patient has primary care physician: Yes Home environment has been reviewed: resides in private residence with spouse Prior level of function:: Independent Prior/Current Home Services: No current home services Social Determinants of Health Reivew: SDOH reviewed no interventions necessary Readmission risk has been reviewed: Yes Transition of care needs: no transition of care needs at this time

## 2023-08-23 NOTE — Assessment & Plan Note (Signed)
-   no obvious signs of infection; unclear precipitating factor - has been weaned off insulin drip - continue SQ insulin

## 2023-08-23 NOTE — Plan of Care (Signed)
  Problem: Cardiac: Goal: Ability to maintain an adequate cardiac output will improve Outcome: Progressing   Problem: Health Behavior/Discharge Planning: Goal: Ability to identify and utilize available resources and services will improve Outcome: Progressing Goal: Ability to manage health-related needs will improve Outcome: Progressing   Problem: Metabolic: Goal: Ability to maintain appropriate glucose levels will improve Outcome: Progressing   Problem: Nutritional: Goal: Maintenance of adequate weight for body size and type will improve Outcome: Progressing   Problem: Respiratory: Goal: Will regain and/or maintain adequate ventilation Outcome: Completed/Met

## 2023-08-23 NOTE — Assessment & Plan Note (Signed)
-  Resume Crestor

## 2023-08-23 NOTE — Assessment & Plan Note (Signed)
-   follows with urology outpatient - denies constipation; had been voiding well at home prior to admission - required I&O on admission with 1.4 L output -No further urinary retention with bladder scans.  Patient able to void well on her own -She has outpatient follow-up with urology already scheduled

## 2023-08-24 DIAGNOSIS — E1110 Type 2 diabetes mellitus with ketoacidosis without coma: Secondary | ICD-10-CM | POA: Diagnosis not present

## 2023-08-24 DIAGNOSIS — R339 Retention of urine, unspecified: Secondary | ICD-10-CM | POA: Diagnosis not present

## 2023-08-24 LAB — BASIC METABOLIC PANEL
Anion gap: 10 (ref 5–15)
BUN: 15 mg/dL (ref 6–20)
CO2: 22 mmol/L (ref 22–32)
Calcium: 8.2 mg/dL — ABNORMAL LOW (ref 8.9–10.3)
Chloride: 100 mmol/L (ref 98–111)
Creatinine, Ser: 0.61 mg/dL (ref 0.44–1.00)
GFR, Estimated: >60 mL/min (ref >60)
Glucose, Bld: 170 mg/dL — ABNORMAL HIGH (ref 70–99)
Potassium: 3.7 mmol/L (ref 3.5–5.1)
Sodium: 132 mmol/L — ABNORMAL LOW (ref 135–145)

## 2023-08-24 LAB — CBC WITH DIFFERENTIAL/PLATELET
Abs Immature Granulocytes: 0.04 10*3/uL (ref 0.00–0.07)
Basophils Absolute: 0 10*3/uL (ref 0.0–0.1)
Basophils Relative: 0 %
Eosinophils Absolute: 0.1 10*3/uL (ref 0.0–0.5)
Eosinophils Relative: 1 %
HCT: 42.4 % (ref 36.0–46.0)
Hemoglobin: 13.7 g/dL (ref 12.0–15.0)
Immature Granulocytes: 0 %
Lymphocytes Relative: 43 %
Lymphs Abs: 4.2 10*3/uL — ABNORMAL HIGH (ref 0.7–4.0)
MCH: 29.3 pg (ref 26.0–34.0)
MCHC: 32.3 g/dL (ref 30.0–36.0)
MCV: 90.6 fL (ref 80.0–100.0)
Monocytes Absolute: 0.6 10*3/uL (ref 0.1–1.0)
Monocytes Relative: 7 %
Neutro Abs: 4.7 10*3/uL (ref 1.7–7.7)
Neutrophils Relative %: 49 %
Platelets: 291 10*3/uL (ref 150–400)
RBC: 4.68 MIL/uL (ref 3.87–5.11)
RDW: 15 % (ref 11.5–15.5)
WBC: 9.7 10*3/uL (ref 4.0–10.5)
nRBC: 0 % (ref 0.0–0.2)

## 2023-08-24 LAB — GLUCOSE, CAPILLARY
Glucose-Capillary: 238 mg/dL — ABNORMAL HIGH (ref 70–99)
Glucose-Capillary: 212 mg/dL — ABNORMAL HIGH (ref 70–99)
Glucose-Capillary: 109 mg/dL — ABNORMAL HIGH (ref 70–99)
Glucose-Capillary: 172 mg/dL — ABNORMAL HIGH (ref 70–99)

## 2023-08-24 LAB — MAGNESIUM: Magnesium: 2 mg/dL (ref 1.7–2.4)

## 2023-08-24 MED ORDER — INSULIN GLARGINE-YFGN 100 UNIT/ML ~~LOC~~ SOLN
10.0000 [IU] | Freq: Every day | SUBCUTANEOUS | Status: DC
  Administered 2023-08-25: 10 [IU] via SUBCUTANEOUS
  Filled 2023-08-24: qty 0.1

## 2023-08-24 MED ORDER — OXYCODONE HCL 5 MG PO TABS
5.0000 mg | ORAL_TABLET | ORAL | Status: DC | PRN
  Administered 2023-08-24 (×2): 5 mg via ORAL
  Filled 2023-08-24 (×2): qty 1

## 2023-08-24 MED ORDER — TOUJEO SOLOSTAR 300 UNIT/ML ~~LOC~~ SOPN: 12.0000 [IU] | PEN_INJECTOR | Freq: Every day | SUBCUTANEOUS | Status: DC

## 2023-08-24 NOTE — Inpatient Diabetes Management (Signed)
Inpatient Diabetes Program Recommendations  AACE/ADA: New Consensus Statement on Inpatient Glycemic Control (2015)  Target Ranges:  Prepandial:   less than 140 mg/dL      Peak postprandial:   less than 180 mg/dL (1-2 hours)      Critically ill patients:  140 - 180 mg/dL   Lab Results  Component Value Date   GLUCAP 238 (H) 08/24/2023   HGBA1C 13.8 (H) 05/14/2023    Review of Glycemic Control  Diabetes history: DM1 Outpatient Diabetes medications: Toujeo 12 units at bedtime, Novolog 4 units TID with meals plus correction Current orders for Inpatient glycemic control: Semglee 8 units daily, Novolog 0-15 units tid, 0-5 units hscorrection  Inpatient Diabetes Program Recommendations:   Please consider: -Increase Semglee to 10 units daily -Add Novolog 3 units tid meal coverage if eats 50% meals when meal ordered -Decrease Novolog correction to 0-9 units tid, 0-5 units when eating  Spoke with patient via phone (DM coordinator working remotely on weekend call). Patient states she took her Lantus insulin when she started getting sick and her meal coverage but not her correction. Reviewed with her to take correction instead of meal coverage when she is not eating and follow sick day management. Patient acknowledged understanding.  Thank you, Billy Fischer. Markus Casten, RN, MSN, CDE  Diabetes Coordinator Inpatient Glycemic Control Team Team Pager [phone] (8am-5pm) 08/24/2023 9:44 AM

## 2023-08-24 NOTE — Progress Notes (Signed)
Progress Note    [name] [name]   OZH:[id number]  DOB: October 23, 1961  DOA: 08/22/2023     2 PCP: Everardo All, NP  Initial CC: N/V, urinary retention  Hospital Course: [name] [name] is a 62 y.o. female with medical history significant for insulin-dependent T2DM with recurrent DKA, HTN, HLD, hypothyroidism, marijuana use who is admitted with DKA. She has also had difficulty with urinary retention recently requiring Foley catheter placement and evaluation with urology outpatient.  Catheter has recently been removed and she has been voiding well at home prior to hospitalization.  Upon admission, she again had acute urinary retention requiring straight cath after admission.  Interval History:  No events overnight.  She has been voiding well.  Advancing diet today and we will see how she tolerates.  Assessment and Plan: * Diabetic ketoacidosis associated with type 2 diabetes mellitus ([name])-resolved as of 08/23/2023 - no obvious signs of infection; unclear precipitating factor - has been weaned off insulin drip this morning - continue SQ insulin  Uncontrolled type 1 diabetes mellitus with hyperglycemia, with long-term current use of insulin ([name]) - Continue Semglee and SSI - Continue CBG monitoring - advance diet today  Acute on chronic urinary retention-resolved as of 08/24/2023 - follows with urology outpatient - denies constipation; had been voiding well at home prior to admission - required I&O on admission with 1.4 L output - continue bladder scans; if recurrent increased PVR, will need indwelling foley again to allow bladder rest - voiding well  Essential hypertension - Elevated and uncontrolled on admission.  Also prescribed midodrine outpatient - Blood pressure currently adequate, hold off on any treatment at this time  Hypothyroidism - Continue Synthroid  Hyperlipidemia - Resume Crestor   Old records reviewed in assessment of this patient  Antimicrobials:   DVT  prophylaxis:  enoxaparin (LOVENOX) injection 40 mg Start: 08/22/23 2200   Code Status:   Code Status: Full Code  Mobility Assessment (Last 72 Hours)     Mobility Assessment     Row Name 08/23/23 2304 08/23/23 1230         Does patient have an order for bedrest or is patient medically unstable No - Continue assessment No - Continue assessment      What is the highest level of mobility based on the progressive mobility assessment? Level 6 (Walks independently in room and hall) - Balance while walking in room without assist - Complete Level 6 (Walks independently in room and hall) - Balance while walking in room without assist - Complete               Barriers to discharge: none Disposition Plan:  Home Status is: Inpt  Objective: Blood pressure 113/88, pulse 88, temperature 99.3 F (37.4 C), temperature source Oral, resp. rate 20, height 4\' 11"  (1.499 m), weight 56.5 kg, last menstrual period 08/11/2012, SpO2 98%.  Examination:  Physical Exam Constitutional:      Appearance: She is well-developed.  HENT:     Head: Normocephalic and atraumatic.     Mouth/Throat:     Mouth: Mucous membranes are moist.  Eyes:     Extraocular Movements: Extraocular movements intact.  Cardiovascular:     Rate and Rhythm: Normal rate and regular rhythm.  Pulmonary:     Effort: Pulmonary effort is normal.     Breath sounds: Normal breath sounds.  Abdominal:     General: Bowel sounds are normal. There is no distension.     Palpations: Abdomen is soft.  Tenderness: There is no abdominal tenderness.  Musculoskeletal:        General: Normal range of motion.     Cervical back: Normal range of motion and neck supple.  Skin:    General: Skin is warm and dry.  Neurological:     General: No focal deficit present.     Mental Status: She is alert.  Psychiatric:        Mood and Affect: Mood normal.      Consultants:    Procedures:    Data Reviewed: Results for orders placed or  performed during the hospital encounter of 08/22/23 (from the past 24 hour(s))  Glucose, capillary     Status: Abnormal   Collection Time: 08/23/23  4:56 PM  Result Value Ref Range   Glucose-Capillary 158 (H) 70 - 99 mg/dL  Glucose, capillary     Status: Abnormal   Collection Time: 08/23/23  9:53 PM  Result Value Ref Range   Glucose-Capillary 131 (H) 70 - 99 mg/dL  Basic metabolic panel     Status: Abnormal   Collection Time: 08/24/23  4:53 AM  Result Value Ref Range   Sodium 132 (L) 135 - 145 mmol/L   Potassium 3.7 3.5 - 5.1 mmol/L   Chloride 100 98 - 111 mmol/L   CO2 22 22 - 32 mmol/L   Glucose, Bld 170 (H) 70 - 99 mg/dL   BUN 15 6 - 20 mg/dL   Creatinine, Ser 1.61 0.44 - 1.00 mg/dL   Calcium 8.2 (L) 8.9 - 10.3 mg/dL   GFR, Estimated >09 >60 mL/min   Anion gap 10 5 - 15  CBC with Differential/Platelet     Status: Abnormal   Collection Time: 08/24/23  4:53 AM  Result Value Ref Range   WBC 9.7 4.0 - 10.5 K/uL   RBC 4.68 3.87 - 5.11 MIL/uL   Hemoglobin 13.7 12.0 - 15.0 g/dL   HCT 45.4 09.8 - 11.9 %   MCV 90.6 80.0 - 100.0 fL   MCH 29.3 26.0 - 34.0 pg   MCHC 32.3 30.0 - 36.0 g/dL   RDW 14.7 82.9 - 56.2 %   Platelets 291 150 - 400 K/uL   nRBC 0.0 0.0 - 0.2 %   Neutrophils Relative % 49 %   Neutro Abs 4.7 1.7 - 7.7 K/uL   Lymphocytes Relative 43 %   Lymphs Abs 4.2 (H) 0.7 - 4.0 K/uL   Monocytes Relative 7 %   Monocytes Absolute 0.6 0.1 - 1.0 K/uL   Eosinophils Relative 1 %   Eosinophils Absolute 0.1 0.0 - 0.5 K/uL   Basophils Relative 0 %   Basophils Absolute 0.0 0.0 - 0.1 K/uL   Immature Granulocytes 0 %   Abs Immature Granulocytes 0.04 0.00 - 0.07 K/uL  Magnesium     Status: None   Collection Time: 08/24/23  4:53 AM  Result Value Ref Range   Magnesium 2.0 1.7 - 2.4 mg/dL  Glucose, capillary     Status: Abnormal   Collection Time: 08/24/23  7:15 AM  Result Value Ref Range   Glucose-Capillary 238 (H) 70 - 99 mg/dL  Glucose, capillary     Status: Abnormal    Collection Time: 08/24/23 12:55 PM  Result Value Ref Range   Glucose-Capillary 212 (H) 70 - 99 mg/dL    I have reviewed pertinent nursing notes, vitals, labs, and images as necessary. I have ordered labwork to follow up on as indicated.  I have reviewed the last notes from  staff over past 24 hours. I have discussed patient's care plan and test results with nursing staff, CM/SW, and other staff as appropriate.    LOS: 2 days   Lewie Chamber, MD [facility] 08/24/2023, 4:09 PM

## 2023-08-24 NOTE — Plan of Care (Signed)
  Problem: Nutrition: Goal: Adequate nutrition will be maintained Outcome: Progressing   Problem: Coping: Goal: Level of anxiety will decrease Outcome: Progressing   Problem: Pain Managment: Goal: General experience of comfort will improve Outcome: Progressing   

## 2023-08-25 DIAGNOSIS — R339 Retention of urine, unspecified: Secondary | ICD-10-CM | POA: Diagnosis not present

## 2023-08-25 DIAGNOSIS — E1110 Type 2 diabetes mellitus with ketoacidosis without coma: Secondary | ICD-10-CM | POA: Diagnosis not present

## 2023-08-25 LAB — GLUCOSE, CAPILLARY
Glucose-Capillary: 200 mg/dL — ABNORMAL HIGH (ref 70–99)
Glucose-Capillary: 205 mg/dL — ABNORMAL HIGH (ref 70–99)

## 2023-08-25 NOTE — Discharge Summary (Signed)
Physician Discharge Summary   [name] [name] GNF:[id number] DOB: 1961-09-21 DOA: 08/22/2023  PCP: Everardo All, NP  Admit date: 08/22/2023 Discharge date: 08/25/2023  Admitted From: Home Disposition:  Home Discharging physician: Lewie Chamber, MD Barriers to discharge: none  Recommendations at discharge: Follow up with urology as planned Adjust insulin regimen as needed  Discharge Condition: stable CODE STATUS: Full Diet recommendation:  Diet Orders (From admission, onward)     Start     Ordered   08/25/23 0000  Diet Carb Modified        08/25/23 0921   08/24/23 0951  Diet Carb Modified Fluid consistency: Thin  Diet effective now       Question Answer Comment  Calorie Level Medium 1600-2000   Fluid consistency: Thin      08/24/23 0951            Hospital Course: [name] [name] is a 62 y.o. female with medical history significant for insulin-dependent T2DM with recurrent DKA, HTN, HLD, hypothyroidism, marijuana use who is admitted with DKA. She has also had difficulty with urinary retention recently requiring Foley catheter placement and evaluation with urology outpatient.  Catheter has recently been removed and she has been voiding well at home prior to hospitalization.  Upon admission, she again had acute urinary retention requiring straight cath after admission.  As pain improved and DKA resolved, she was able to continue to void spontaneously on her own.  Assessment and Plan: * Diabetic ketoacidosis associated with type 2 diabetes mellitus ([name])-resolved as of 08/23/2023 - no obvious signs of infection; unclear precipitating factor - has been weaned off insulin drip - continue SQ insulin  Uncontrolled type 1 diabetes mellitus with hyperglycemia, with long-term current use of insulin ([name]) - Tolerated diet advancement - Continue home regimen  Acute on chronic urinary retention-resolved as of 08/24/2023 - follows with urology outpatient - denies constipation; had  been voiding well at home prior to admission - required I&O on admission with 1.4 L output -No further urinary retention with bladder scans.  Patient able to void well on her own -She has outpatient follow-up with urology already scheduled  Essential hypertension - BP well-controlled.  On midodrine outpatient  Hypothyroidism - Continue Synthroid  Hyperlipidemia - Resume Crestor   The patient's acute and chronic medical conditions were treated accordingly. On day of discharge, patient was felt deemed stable for discharge. Patient/family member advised to call PCP or come back to ER if needed.   Principal Diagnosis: Diabetic ketoacidosis associated with type 2 diabetes mellitus [facility])  Discharge Diagnoses: Active Hospital Problems   Diagnosis Date Noted   Uncontrolled type 1 diabetes mellitus with hyperglycemia, with long-term current use of insulin ([name]) 05/14/2023    Priority: 2.   Essential hypertension 02/01/2011    Priority: 3.   Hypothyroidism 04/29/2008    Priority: 5.   Hyperlipidemia 04/29/2008    Priority: 7.    Resolved Hospital Problems   Diagnosis Date Noted Date Resolved   Diabetic ketoacidosis associated with type 2 diabetes mellitus ([name]) 08/22/2023 08/23/2023    Priority: 1.   Acute on chronic urinary retention 01/07/2023 08/24/2023    Priority: 2.     Discharge Instructions     Diet Carb Modified   Complete by: As directed    Increase activity slowly   Complete by: As directed       Allergies as of 08/25/2023       Reactions   Codeine Hives, Anxiety, Other (See Comments)  Medication List     STOP taking these medications    atorvastatin 80 MG tablet Commonly known as: LIPITOR       TAKE these medications    aspirin EC 81 MG tablet Take 81 mg by mouth daily. Swallow whole.   Dexcom G6 Sensor Misc INJECT 1 SENSOR INTO SKIN EVERY 10 DAYS What changed: See the new instructions.   Dexcom G6 Transmitter Misc USE AS DIRECTED  FOR CONTINUOUS GLUCOSE MONITORING. REUSE TRANSMITTER X 90DAYS THEN DISCARD & REPLACE   Glucagon Emergency 1 MG Kit Inject 1 mg into the skin See admin instructions. Inject one mg into the skin see administration instructions. Follow package directions for low blood sugar.   levothyroxine 137 MCG tablet Commonly known as: SYNTHROID Take 137 mcg by mouth daily before breakfast.   metoCLOPramide 5 MG tablet Commonly known as: REGLAN Take 1 tablet (5 mg total) by mouth 3 (three) times daily before meals.   midodrine 5 MG tablet Commonly known as: PROAMATINE Take 1 tablet (5 mg total) by mouth 2 (two) times daily with a meal. Hold if your systolic blood pressure  ( top number) is > , please check your blood pressure at home once a day and bring in record for your doctor to review, follow up with your pcp and cardiology regarding your blood pressure medication What changed:  when to take this additional instructions   NovoLOG FlexPen 100 UNIT/ML FlexPen Generic drug: insulin aspart Inject 4 Units into the skin See admin instructions. Inject 4 units into the skin three times a day after meals, PLUS sliding scale coverage   onetouch ultrasoft lancets Use to check blood sugar daily as needed.  Dx: E11.10   pantoprazole 40 MG tablet Commonly known as: Protonix Take 1 tablet (40 mg total) by mouth 2 (two) times daily.   Pen Needles 31G X 8 MM Misc Use to inject insulin 4 times a day.  Dx: E11.10   ranolazine 500 MG 12 hr tablet Commonly known as: RANEXA Take 500 mg by mouth 2 (two) times daily.   rosuvastatin 10 MG tablet Commonly known as: CRESTOR Take 10 mg by mouth at bedtime.   Toujeo SoloStar 300 UNIT/ML Solostar Pen Generic drug: insulin glargine (1 Unit Dial) Inject 12 Units into the skin at bedtime.   zolpidem 5 MG tablet Commonly known as: AMBIEN Take 2.5 mg by mouth at bedtime as needed for sleep.        Allergies  Allergen Reactions   Codeine Hives,  Anxiety and Other (See Comments)    Consultations:   Procedures:   Discharge Exam: BP 114/77 (BP Location: Left Arm)   Pulse 89   Temp 98.2 F (36.8 C) (Oral)   Resp 19   Ht 4\' 11"  (1.499 m)   Wt 56.5 kg   LMP 08/11/2012   SpO2 98%   BMI 25.16 kg/m  Physical Exam Constitutional:      Appearance: She is well-developed.  HENT:     Head: Normocephalic and atraumatic.     Mouth/Throat:     Mouth: Mucous membranes are moist.  Eyes:     Extraocular Movements: Extraocular movements intact.  Cardiovascular:     Rate and Rhythm: Normal rate and regular rhythm.  Pulmonary:     Effort: Pulmonary effort is normal.     Breath sounds: Normal breath sounds.  Abdominal:     General: Bowel sounds are normal. There is no distension.     Palpations: Abdomen is soft.  Tenderness: There is no abdominal tenderness.  Musculoskeletal:        General: Normal range of motion.     Cervical back: Normal range of motion and neck supple.  Skin:    General: Skin is warm and dry.  Neurological:     General: No focal deficit present.     Mental Status: She is alert.  Psychiatric:        Mood and Affect: Mood normal.      The results of significant diagnostics from this hospitalization (including imaging, microbiology, ancillary and laboratory) are listed below for reference.   Microbiology: Recent Results (from the past 240 hour(s))  Culture, blood (single)     Status: None (Preliminary result)   Collection Time: 08/22/23  5:22 PM   Specimen: BLOOD  Result Value Ref Range Status   Specimen Description   Final    BLOOD SITE NOT SPECIFIED Performed at [facility], [address] [place], [place] [address]    Special Requests   Final    BOTTLES DRAWN AEROBIC AND ANAEROBIC Blood Culture results may not be optimal due to an excessive volume of blood received in culture bottles Performed at [facility], [address] [place], [place] [address]     Culture   Final    NO GROWTH 3 DAYS Performed at [facility], [address] [place], [place] [address]    Report Status PENDING  Incomplete  Urine Culture (for pregnant, neutropenic or urologic patients or patients with an indwelling urinary catheter)     Status: Abnormal   Collection Time: 08/22/23  8:39 PM   Specimen: Urine, Clean Catch  Result Value Ref Range Status   Specimen Description   Final    URINE, CLEAN CATCH Performed at [facility], [address], [place], [place] [address]    Special Requests   Final    NONE Performed at [facility], [address], [place], [place] [address]    Culture (A)  Final    >=100,000 COLONIES/mL DIPHTHEROIDS(CORYNEBACTERIUM SPECIES) Standardized susceptibility testing for this organism is not available. Performed at [facility], [address], [place], [place] [address]    Report Status 08/23/2023 FINAL  Final  MRSA Next Gen by PCR, Nasal     Status: None   Collection Time: 08/23/23  2:19 AM   Specimen: Nasal Mucosa; Nasal Swab  Result Value Ref Range Status   MRSA by PCR Next Gen NOT DETECTED NOT DETECTED Final    Comment: (NOTE) The GeneXpert MRSA Assay (FDA approved for NASAL specimens only), is one component of a comprehensive MRSA colonization surveillance program. It is not intended to diagnose MRSA infection nor to guide or monitor treatment for MRSA infections. Test performance is not FDA approved in patients less than 43 years old. Performed at [facility], [address], [place], [place] [address]      Labs: BNP (last 3 results) No results for input(s): "BNP" in the last 8760 hours. Basic Metabolic Panel: Recent Labs  Lab 08/22/23 1441 08/22/23 1506 08/22/23 2106 08/23/23 0300 08/24/23 0453  NA 137 136 136 136 132*  K 3.8 4.7 2.9* 3.5 3.7  CL 97* 100 97* 97* 100  CO2 20*  --  21* 28 22  GLUCOSE 445* 468* 357* 156* 170*  BUN 20 25*  16 12 15   CREATININE 0.64 0.40* 0.68 0.53 0.61  CALCIUM 9.5  --  9.3  9.0 8.2*  MG  --   --   --   --  2.0   Liver Function Tests: Recent Labs  Lab 08/22/23 1441  AST 20  ALT 16  ALKPHOS 89  BILITOT 1.1  PROT 8.9*  ALBUMIN 4.9   Recent Labs  Lab 08/22/23 1441  LIPASE 40   No results for input(s): "AMMONIA" in the last 168 hours. CBC: Recent Labs  Lab 08/22/23 1441 08/22/23 1506 08/22/23 2106 08/23/23 0715 08/24/23 0453  WBC 15.8*  --  13.1* 15.5* 9.7  NEUTROABS 13.1*  --  11.4*  --  4.7  HGB 16.4* 16.7* 15.2* 14.0 13.7  HCT 49.9* 49.0* 45.5 41.2 42.4  MCV 87.9  --  87.2 87.1 90.6  PLT 410*  --  404* 352 291   Cardiac Enzymes: No results for input(s): "CKTOTAL", "CKMB", "CKMBINDEX", "TROPONINI" in the last 168 hours. BNP: Invalid input(s): "POCBNP" CBG: Recent Labs  Lab 08/24/23 1255 08/24/23 1627 08/24/23 2104 08/25/23 0732 08/25/23 1118  GLUCAP 212* 109* 172* 200* 205*   D-Dimer No results for input(s): "DDIMER" in the last 72 hours. Hgb A1c No results for input(s): "HGBA1C" in the last 72 hours. Lipid Profile No results for input(s): "CHOL", "HDL", "LDLCALC", "TRIG", "CHOLHDL", "LDLDIRECT" in the last 72 hours. Thyroid function studies No results for input(s): "TSH", "T4TOTAL", "T3FREE", "THYROIDAB" in the last 72 hours.  Invalid input(s): "FREET3" Anemia work up No results for input(s): "VITAMINB12", "FOLATE", "FERRITIN", "TIBC", "IRON", "RETICCTPCT" in the last 72 hours. Urinalysis    Component Value Date/Time   COLORURINE YELLOW 08/22/2023 1539   APPEARANCEUR HAZY (A) 08/22/2023 1539   LABSPEC 1.025 08/22/2023 1539   PHURINE 7.0 08/22/2023 1539   GLUCOSEU >=500 (A) 08/22/2023 1539   HGBUR NEGATIVE 08/22/2023 1539   BILIRUBINUR NEGATIVE 08/22/2023 1539   BILIRUBINUR negative 03/09/2021 1123   KETONESUR 20 (A) 08/22/2023 1539   PROTEINUR 30 (A) 08/22/2023 1539   UROBILINOGEN 0.2 03/09/2021 1123   UROBILINOGEN 0.2 10/28/2013 1723    NITRITE NEGATIVE 08/22/2023 1539   LEUKOCYTESUR NEGATIVE 08/22/2023 1539   Sepsis Labs Recent Labs  Lab 08/22/23 1441 08/22/23 2106 08/23/23 0715 08/24/23 0453  WBC 15.8* 13.1* 15.5* 9.7   Microbiology Recent Results (from the past 240 hour(s))  Culture, blood (single)     Status: None (Preliminary result)   Collection Time: 08/22/23  5:22 PM   Specimen: BLOOD  Result Value Ref Range Status   Specimen Description   Final    BLOOD SITE NOT SPECIFIED Performed at [facility], [address] [place], [place] [address]    Special Requests   Final    BOTTLES DRAWN AEROBIC AND ANAEROBIC Blood Culture results may not be optimal due to an excessive volume of blood received in culture bottles Performed at [facility], [address], [place], [place] [address]    Culture   Final    NO GROWTH 3 DAYS Performed at [facility], [address], [place], [place] [address]    Report Status PENDING  Incomplete  Urine Culture (for pregnant, neutropenic or urologic patients or patients with an indwelling urinary catheter)     Status: Abnormal   Collection Time: 08/22/23  8:39 PM   Specimen: Urine, Clean Catch  Result Value Ref Range Status   Specimen Description   Final    URINE, CLEAN CATCH Performed at [facility], [address], [place], [place] [address]    Special Requests  Final    NONE Performed at [facility], [address], [place], [place] [address]    Culture (A)  Final    >=100,000 COLONIES/mL DIPHTHEROIDS(CORYNEBACTERIUM SPECIES) Standardized susceptibility testing for this organism is not available. Performed at [facility], [address], [place], [place] [address]    Report Status 08/23/2023 FINAL  Final  MRSA Next Gen by PCR, Nasal     Status: None   Collection Time: 08/23/23  2:19 AM   Specimen: Nasal Mucosa; Nasal Swab  Result Value Ref Range Status   MRSA by PCR  Next Gen NOT DETECTED NOT DETECTED Final    Comment: (NOTE) The GeneXpert MRSA Assay (FDA approved for NASAL specimens only), is one component of a comprehensive MRSA colonization surveillance program. It is not intended to diagnose MRSA infection nor to guide or monitor treatment for MRSA infections. Test performance is not FDA approved in patients less than 72 years old. Performed at [facility], [address], [place], [place] [address]     Procedures/Studies: CT ABDOMEN PELVIS W CONTRAST  Result Date: 08/22/2023 CLINICAL DATA:  Abdomen pain nausea vomiting EXAM: CT ABDOMEN AND PELVIS WITH CONTRAST TECHNIQUE: Multidetector CT imaging of the abdomen and pelvis was performed using the standard protocol following bolus administration of intravenous contrast. RADIATION DOSE REDUCTION: This exam was performed according to the departmental dose-optimization program which includes automated exposure control, adjustment of the mA and/or kV according to patient size and/or use of iterative reconstruction technique. CONTRAST:  OMNIPAQUE IOHEXOL 300 MG/ML  SOLN COMPARISON:  CT 08/01/2023, 06/14/2023, 05/14/2023, and prior exams dating back to 06/03/2017 FINDINGS: Lower chest: Lung bases demonstrate no acute airspace disease. Hepatobiliary: Hepatic steatosis. No calcified gallstone. Stable thickening at the gallbladder fundus compared with multiple priors and likely due to adenomyomatosis. No biliary dilatation Pancreas: Unremarkable. No pancreatic ductal dilatation or surrounding inflammatory changes. Spleen: Normal in size without focal abnormality. Adrenals/Urinary Tract: Adrenal glands are unremarkable. Kidneys are normal, without renal calculi, focal lesion, or hydronephrosis. The bladder is diffusely thick walled in appearance. Stomach/Bowel: Stomach is nonenlarged. There is no dilated small bowel. Colon diverticular disease. Collapsed appearance versus mild colitis type  changes of the distal descending and sigmoid colon. Negative appendix Vascular/Lymphatic: Mild aortic atherosclerosis. No aneurysm. No suspicious lymph nodes. Reproductive: Status post hysterectomy. No adnexal masses. Other: Negative for pelvic effusion or free air. Small fat containing umbilical hernia Musculoskeletal: No acute or suspicious osseous abnormality. IMPRESSION: 1. Collapsed appearance versus mild colitis type changes of the distal descending and sigmoid colon. 2. Diffusely thick walled appearance of the bladder, cystitis versus changes related to chronic obstruction. 3. Hepatic steatosis. 4. Aortic atherosclerosis. Aortic Atherosclerosis (ICD10-I70.0). Electronically Signed   By: Jasmine Pang M.D.   On: 08/22/2023 17:15   CT ABDOMEN PELVIS W CONTRAST  Result Date: 08/01/2023 CLINICAL DATA:  62 year old female with history of acute onset of nonlocalized abdominal pain. Frequent urination. EXAM: CT ABDOMEN AND PELVIS WITH CONTRAST TECHNIQUE: Multidetector CT imaging of the abdomen and pelvis was performed using the standard protocol following bolus administration of intravenous contrast. RADIATION DOSE REDUCTION: This exam was performed according to the departmental dose-optimization program which includes automated exposure control, adjustment of the mA and/or kV according to patient size and/or use of iterative reconstruction technique. CONTRAST:  75mL OMNIPAQUE IOHEXOL 350 MG/ML SOLN COMPARISON:  CT of the abdomen and pelvis 06/14/2023. FINDINGS: Lower chest: Circumferential thickening of the distal esophagus (axial image 3 of series 3).  Hepatobiliary: Poorly defined low-attenuation in segments 4A and 4B adjacent to the falciform ligament, similar to prior studies, likely reflective of focal fatty infiltration and/or benign perfusion anomaly (no imaging follow-up recommended). No other definite suspicious appearing hepatic lesions are noted. Generalized low attenuation noted elsewhere throughout  the hepatic parenchyma, suggesting a background of hepatic steatosis. No intra or extrahepatic biliary ductal dilatation. Gallbladder is remarkable for some focal thickening near the fundus, similar to the prior study and multiple other more remote prior examinations, likely reflective of adenomyomatosis. Pancreas: No pancreatic mass. No pancreatic ductal dilatation. No pancreatic or peripancreatic fluid collections or inflammatory changes. Spleen: Unremarkable. Adrenals/Urinary Tract: Bilateral kidneys and adrenal glands are normal in appearance. No hydroureteronephrosis. Urinary bladder is again severely distended. Stomach/Bowel: The appearance of the stomach is normal. There is no pathologic dilatation of small bowel or colon. A few scattered colonic diverticuli are noted, without surrounding inflammatory changes to indicate an acute diverticulitis at this time. Normal appendix. Vascular/Lymphatic: Atherosclerotic calcifications in the abdominal aorta and pelvic vasculature. No lymphadenopathy noted in the abdomen or pelvis. Reproductive: Status post hysterectomy. Ovaries are not confidently identified may be surgically absent or atrophic. Other: Small umbilical hernia containing only omental fat. No significant volume of ascites. No pneumoperitoneum. Musculoskeletal: There are no aggressive appearing lytic or blastic lesions noted in the visualized portions of the skeleton. IMPRESSION: 1. Severe distension of the urinary bladder, similar to the prior study. Clinical correlation for signs and symptoms of chronic bladder outlet obstruction is recommended. 2. Colonic diverticulosis without evidence of acute diverticulitis at this time. 3. Circumferential thickening of the distal esophagus. This may simply represent esophagitis, however, correlation with nonemergent endoscopy is suggested in the near future to exclude the possibility of a distal esophageal mass. 4. Focal thickening of the gallbladder in the region  of the fundus, similar to numerous prior examinations. This is favored to represent adenomyomatosis. Follow-up nonemergent outpatient right upper quadrant abdominal ultrasound should be considered for further evaluation. 5. Hepatic steatosis. 6. Aortic atherosclerosis. 7. Additional incidental findings, as above. Electronically Signed   By: Trudie Reed M.D.   On: 08/01/2023 06:42     Time coordinating discharge: Over 30 minutes    Lewie Chamber, MD  [facility] 08/25/2023, 12:17 PM

## 2023-08-25 NOTE — Plan of Care (Signed)
  Problem: Education: Goal: Ability to describe self-care measures that may prevent or decrease complications (Diabetes Survival Skills Education) will improve Outcome: Adequate for Discharge Goal: Individualized Educational Video(s) Outcome: Adequate for Discharge   Problem: Cardiac: Goal: Ability to maintain an adequate cardiac output will improve Outcome: Adequate for Discharge   Problem: Health Behavior/Discharge Planning: Goal: Ability to identify and utilize available resources and services will improve Outcome: Adequate for Discharge Goal: Ability to manage health-related needs will improve Outcome: Adequate for Discharge   Problem: Fluid Volume: Goal: Ability to achieve a balanced intake and output will improve Outcome: Adequate for Discharge   Problem: Metabolic: Goal: Ability to maintain appropriate glucose levels will improve Outcome: Adequate for Discharge   Problem: Nutritional: Goal: Maintenance of adequate nutrition will improve Outcome: Adequate for Discharge Goal: Maintenance of adequate weight for body size and type will improve Outcome: Adequate for Discharge   Problem: Urinary Elimination: Goal: Ability to achieve and maintain adequate renal perfusion and functioning will improve Outcome: Adequate for Discharge   Problem: Education: Goal: Ability to describe self-care measures that may prevent or decrease complications (Diabetes Survival Skills Education) will improve Outcome: Adequate for Discharge Goal: Individualized Educational Video(s) Outcome: Adequate for Discharge   Problem: Coping: Goal: Ability to adjust to condition or change in health will improve Outcome: Adequate for Discharge   Problem: Fluid Volume: Goal: Ability to maintain a balanced intake and output will improve Outcome: Adequate for Discharge   Problem: Health Behavior/Discharge Planning: Goal: Ability to identify and utilize available resources and services will  improve Outcome: Adequate for Discharge Goal: Ability to manage health-related needs will improve Outcome: Adequate for Discharge   Problem: Metabolic: Goal: Ability to maintain appropriate glucose levels will improve Outcome: Adequate for Discharge   Problem: Nutritional: Goal: Maintenance of adequate nutrition will improve Outcome: Adequate for Discharge Goal: Progress toward achieving an optimal weight will improve Outcome: Adequate for Discharge   Problem: Skin Integrity: Goal: Risk for impaired skin integrity will decrease Outcome: Adequate for Discharge   Problem: Tissue Perfusion: Goal: Adequacy of tissue perfusion will improve Outcome: Adequate for Discharge   Problem: Education: Goal: Knowledge of General Education information will improve Description: Including pain rating scale, medication(s)/side effects and non-pharmacologic comfort measures Outcome: Adequate for Discharge   Problem: Health Behavior/Discharge Planning: Goal: Ability to manage health-related needs will improve Outcome: Adequate for Discharge   Problem: Clinical Measurements: Goal: Ability to maintain clinical measurements within normal limits will improve Outcome: Adequate for Discharge Goal: Will remain free from infection Outcome: Adequate for Discharge Goal: Diagnostic test results will improve Outcome: Adequate for Discharge Goal: Respiratory complications will improve Outcome: Adequate for Discharge Goal: Cardiovascular complication will be avoided Outcome: Adequate for Discharge   Problem: Activity: Goal: Risk for activity intolerance will decrease Outcome: Adequate for Discharge   Problem: Nutrition: Goal: Adequate nutrition will be maintained Outcome: Adequate for Discharge   Problem: Coping: Goal: Level of anxiety will decrease Outcome: Adequate for Discharge   Problem: Elimination: Goal: Will not experience complications related to bowel motility Outcome: Adequate for  Discharge Goal: Will not experience complications related to urinary retention Outcome: Adequate for Discharge   Problem: Pain Managment: Goal: General experience of comfort will improve Outcome: Adequate for Discharge   Problem: Safety: Goal: Ability to remain free from injury will improve Outcome: Adequate for Discharge   Problem: Skin Integrity: Goal: Risk for impaired skin integrity will decrease Outcome: Adequate for Discharge

## 2023-08-26 LAB — HEMOGLOBIN A1C
Hgb A1c MFr Bld: >15.5 % — ABNORMAL HIGH (ref 4.8–5.6)
Mean Plasma Glucose: >398 mg/dL

## 2023-08-27 LAB — CULTURE, BLOOD (SINGLE): Culture: NO GROWTH

## 2023-08-28 DIAGNOSIS — F4312 Post-traumatic stress disorder, chronic: Secondary | ICD-10-CM | POA: Diagnosis not present

## 2023-08-28 DIAGNOSIS — F319 Bipolar disorder, unspecified: Secondary | ICD-10-CM | POA: Diagnosis not present

## 2023-09-02 ENCOUNTER — Inpatient Hospital Stay (HOSPITAL_COMMUNITY)
Admission: EM | Admit: 2023-09-02 | Discharge: 2023-09-04 | DRG: 639 | Disposition: A | Discharge status: Home / Self Care | Payer: BC Managed Care – PPO | Attending: Internal Medicine | Admitting: Internal Medicine

## 2023-09-02 ENCOUNTER — Emergency Department (HOSPITAL_COMMUNITY): Payer: BC Managed Care – PPO

## 2023-09-02 ENCOUNTER — Encounter (HOSPITAL_COMMUNITY): Payer: Self-pay | Admitting: Radiology

## 2023-09-02 DIAGNOSIS — I2510 Atherosclerotic heart disease of native coronary artery without angina pectoris: Secondary | ICD-10-CM | POA: Diagnosis not present

## 2023-09-02 DIAGNOSIS — Z87891 Personal history of nicotine dependence: Secondary | ICD-10-CM | POA: Diagnosis not present

## 2023-09-02 DIAGNOSIS — R339 Retention of urine, unspecified: Secondary | ICD-10-CM | POA: Diagnosis present

## 2023-09-02 DIAGNOSIS — Z885 Allergy status to narcotic agent status: Secondary | ICD-10-CM | POA: Diagnosis not present

## 2023-09-02 DIAGNOSIS — I10 Essential (primary) hypertension: Secondary | ICD-10-CM | POA: Diagnosis present

## 2023-09-02 DIAGNOSIS — N182 Chronic kidney disease, stage 2 (mild): Secondary | ICD-10-CM | POA: Diagnosis present

## 2023-09-02 DIAGNOSIS — E1065 Type 1 diabetes mellitus with hyperglycemia: Secondary | ICD-10-CM | POA: Diagnosis not present

## 2023-09-02 DIAGNOSIS — Z7982 Long term (current) use of aspirin: Secondary | ICD-10-CM | POA: Diagnosis not present

## 2023-09-02 DIAGNOSIS — E039 Hypothyroidism, unspecified: Secondary | ICD-10-CM | POA: Diagnosis not present

## 2023-09-02 DIAGNOSIS — Z833 Family history of diabetes mellitus: Secondary | ICD-10-CM

## 2023-09-02 DIAGNOSIS — I129 Hypertensive chronic kidney disease with stage 1 through stage 4 chronic kidney disease, or unspecified chronic kidney disease: Secondary | ICD-10-CM | POA: Diagnosis present

## 2023-09-02 DIAGNOSIS — J302 Other seasonal allergic rhinitis: Secondary | ICD-10-CM | POA: Diagnosis present

## 2023-09-02 DIAGNOSIS — E1042 Type 1 diabetes mellitus with diabetic polyneuropathy: Secondary | ICD-10-CM | POA: Diagnosis present

## 2023-09-02 DIAGNOSIS — Z91148 Patient's other noncompliance with medication regimen for other reason: Secondary | ICD-10-CM | POA: Diagnosis not present

## 2023-09-02 DIAGNOSIS — T383X6A Underdosing of insulin and oral hypoglycemic [antidiabetic] drugs, initial encounter: Secondary | ICD-10-CM | POA: Diagnosis present

## 2023-09-02 DIAGNOSIS — R160 Hepatomegaly, not elsewhere classified: Secondary | ICD-10-CM | POA: Diagnosis not present

## 2023-09-02 DIAGNOSIS — D72829 Elevated white blood cell count, unspecified: Secondary | ICD-10-CM | POA: Diagnosis not present

## 2023-09-02 DIAGNOSIS — Z90710 Acquired absence of both cervix and uterus: Secondary | ICD-10-CM | POA: Diagnosis not present

## 2023-09-02 DIAGNOSIS — Z8249 Family history of ischemic heart disease and other diseases of the circulatory system: Secondary | ICD-10-CM

## 2023-09-02 DIAGNOSIS — R739 Hyperglycemia, unspecified: Secondary | ICD-10-CM | POA: Diagnosis not present

## 2023-09-02 DIAGNOSIS — K429 Umbilical hernia without obstruction or gangrene: Secondary | ICD-10-CM | POA: Diagnosis not present

## 2023-09-02 DIAGNOSIS — Z79899 Other long term (current) drug therapy: Secondary | ICD-10-CM | POA: Diagnosis not present

## 2023-09-02 DIAGNOSIS — E1165 Type 2 diabetes mellitus with hyperglycemia: Secondary | ICD-10-CM | POA: Diagnosis not present

## 2023-09-02 DIAGNOSIS — Z794 Long term (current) use of insulin: Secondary | ICD-10-CM | POA: Diagnosis not present

## 2023-09-02 DIAGNOSIS — R109 Unspecified abdominal pain: Secondary | ICD-10-CM | POA: Diagnosis not present

## 2023-09-02 DIAGNOSIS — R1084 Generalized abdominal pain: Secondary | ICD-10-CM | POA: Diagnosis not present

## 2023-09-02 DIAGNOSIS — Z79890 Hormone replacement therapy (postmenopausal): Secondary | ICD-10-CM | POA: Diagnosis not present

## 2023-09-02 DIAGNOSIS — R000 Tachycardia, unspecified: Secondary | ICD-10-CM | POA: Diagnosis not present

## 2023-09-02 DIAGNOSIS — K219 Gastro-esophageal reflux disease without esophagitis: Secondary | ICD-10-CM | POA: Diagnosis present

## 2023-09-02 DIAGNOSIS — E785 Hyperlipidemia, unspecified: Secondary | ICD-10-CM | POA: Diagnosis not present

## 2023-09-02 LAB — BLOOD GAS, VENOUS
Acid-Base Excess: 4.2 mmol/L — ABNORMAL HIGH (ref 0.0–2.0)
Bicarbonate: 28.4 mmol/L — ABNORMAL HIGH (ref 20.0–28.0)
O2 Saturation: 89.3 %
Patient temperature: 37
pCO2, Ven: 40 mmHg — ABNORMAL LOW (ref 44–60)
pH, Ven: 7.46 — ABNORMAL HIGH (ref 7.25–7.43)
pO2, Ven: 58 mmHg — ABNORMAL HIGH (ref 32–45)

## 2023-09-02 LAB — CBC WITH DIFFERENTIAL/PLATELET
Abs Immature Granulocytes: 0.15 10*3/uL — ABNORMAL HIGH (ref 0.00–0.07)
Basophils Absolute: 0.1 10*3/uL (ref 0.0–0.1)
Basophils Relative: 0 %
Eosinophils Absolute: 0 10*3/uL (ref 0.0–0.5)
Eosinophils Relative: 0 %
HCT: 43.6 % (ref 36.0–46.0)
Hemoglobin: 14.8 g/dL (ref 12.0–15.0)
Immature Granulocytes: 1 %
Lymphocytes Relative: 14 %
Lymphs Abs: 1.7 10*3/uL (ref 0.7–4.0)
MCH: 29.1 pg (ref 26.0–34.0)
MCHC: 33.9 g/dL (ref 30.0–36.0)
MCV: 85.8 fL (ref 80.0–100.0)
Monocytes Absolute: 0.4 10*3/uL (ref 0.1–1.0)
Monocytes Relative: 4 %
Neutro Abs: 9.8 10*3/uL — ABNORMAL HIGH (ref 1.7–7.7)
Neutrophils Relative %: 81 %
Platelets: 366 10*3/uL (ref 150–400)
RBC: 5.08 MIL/uL (ref 3.87–5.11)
RDW: 14.6 % (ref 11.5–15.5)
WBC: 12.2 10*3/uL — ABNORMAL HIGH (ref 4.0–10.5)
nRBC: 0 % (ref 0.0–0.2)

## 2023-09-02 LAB — CBC
HCT: 39.9 % (ref 36.0–46.0)
Hemoglobin: 13.3 g/dL (ref 12.0–15.0)
MCH: 29.1 pg (ref 26.0–34.0)
MCHC: 33.3 g/dL (ref 30.0–36.0)
MCV: 87.3 fL (ref 80.0–100.0)
Platelets: 334 10*3/uL (ref 150–400)
RBC: 4.57 MIL/uL (ref 3.87–5.11)
RDW: 14.7 % (ref 11.5–15.5)
WBC: 13.4 10*3/uL — ABNORMAL HIGH (ref 4.0–10.5)
nRBC: 0 % (ref 0.0–0.2)

## 2023-09-02 LAB — URINALYSIS, ROUTINE W REFLEX MICROSCOPIC
Bacteria, UA: NONE SEEN
Bilirubin Urine: NEGATIVE
Glucose, UA: >=500 mg/dL — AB
Hgb urine dipstick: NEGATIVE
Ketones, ur: 5 mg/dL — AB
Leukocytes,Ua: NEGATIVE
Nitrite: NEGATIVE
Protein, ur: 30 mg/dL — AB
Specific Gravity, Urine: 1.024 (ref 1.005–1.030)
pH: 7 (ref 5.0–8.0)

## 2023-09-02 LAB — GLUCOSE, CAPILLARY: Glucose-Capillary: 186 mg/dL — ABNORMAL HIGH (ref 70–99)

## 2023-09-02 LAB — BETA-HYDROXYBUTYRIC ACID
Beta-Hydroxybutyric Acid: 1.42 mmol/L — ABNORMAL HIGH (ref 0.05–0.27)
Beta-Hydroxybutyric Acid: 0.09 mmol/L (ref 0.05–0.27)

## 2023-09-02 LAB — BASIC METABOLIC PANEL
Anion gap: 18 — ABNORMAL HIGH (ref 5–15)
Anion gap: 13 (ref 5–15)
BUN: 25 mg/dL — ABNORMAL HIGH (ref 6–20)
BUN: 20 mg/dL (ref 6–20)
CO2: 24 mmol/L (ref 22–32)
CO2: 25 mmol/L (ref 22–32)
Calcium: 9.4 mg/dL (ref 8.9–10.3)
Calcium: 8.3 mg/dL — ABNORMAL LOW (ref 8.9–10.3)
Chloride: 92 mmol/L — ABNORMAL LOW (ref 98–111)
Chloride: 97 mmol/L — ABNORMAL LOW (ref 98–111)
Creatinine, Ser: 0.52 mg/dL (ref 0.44–1.00)
Creatinine, Ser: 0.56 mg/dL (ref 0.44–1.00)
GFR, Estimated: >60 mL/min (ref >60)
GFR, Estimated: >60 mL/min (ref >60)
Glucose, Bld: 569 mg/dL (ref 70–99)
Glucose, Bld: 449 mg/dL — ABNORMAL HIGH (ref 70–99)
Potassium: 3.5 mmol/L (ref 3.5–5.1)
Potassium: 4 mmol/L (ref 3.5–5.1)
Sodium: 134 mmol/L — ABNORMAL LOW (ref 135–145)
Sodium: 135 mmol/L (ref 135–145)

## 2023-09-02 LAB — TROPONIN I (HIGH SENSITIVITY): Troponin I (High Sensitivity): 3 ng/L (ref <18)

## 2023-09-02 LAB — CREATININE, SERUM
Creatinine, Ser: 0.52 mg/dL (ref 0.44–1.00)
GFR, Estimated: >60 mL/min (ref >60)

## 2023-09-02 LAB — CBG MONITORING, ED
Glucose-Capillary: 533 mg/dL (ref 70–99)
Glucose-Capillary: 457 mg/dL — ABNORMAL HIGH (ref 70–99)
Glucose-Capillary: 113 mg/dL — ABNORMAL HIGH (ref 70–99)

## 2023-09-02 MED ORDER — ENOXAPARIN SODIUM 40 MG/0.4ML IJ SOSY
40.0000 mg | PREFILLED_SYRINGE | INTRAMUSCULAR | Status: DC
  Administered 2023-09-03 – 2023-09-04 (×2): 40 mg via SUBCUTANEOUS
  Filled 2023-09-02 (×2): qty 0.4

## 2023-09-02 MED ORDER — INSULIN GLARGINE-YFGN 100 UNIT/ML ~~LOC~~ SOLN
12.0000 [IU] | Freq: Every day | SUBCUTANEOUS | Status: DC
  Administered 2023-09-02: 12 [IU] via SUBCUTANEOUS
  Filled 2023-09-02: qty 0.12

## 2023-09-02 MED ORDER — INSULIN ASPART 100 UNIT/ML IJ SOLN
0.0000 [IU] | Freq: Three times a day (TID) | INTRAMUSCULAR | Status: DC
  Administered 2023-09-03: 2 [IU] via SUBCUTANEOUS
  Administered 2023-09-03: 11 [IU] via SUBCUTANEOUS
  Administered 2023-09-04 (×2): 5 [IU] via SUBCUTANEOUS
  Administered 2023-09-04: 8 [IU] via SUBCUTANEOUS
  Filled 2023-09-02: qty 0.15

## 2023-09-02 MED ORDER — POTASSIUM CHLORIDE 2 MEQ/ML IV SOLN
INTRAVENOUS | Status: DC
  Filled 2023-09-02 (×2): qty 1000

## 2023-09-02 MED ORDER — LACTATED RINGERS IV BOLUS
1000.0000 mL | Freq: Once | INTRAVENOUS | Status: AC
  Administered 2023-09-02: 1000 mL via INTRAVENOUS

## 2023-09-02 MED ORDER — INSULIN ASPART 100 UNIT/ML IJ SOLN
8.0000 [IU] | Freq: Once | INTRAMUSCULAR | Status: AC
  Administered 2023-09-02: 8 [IU] via SUBCUTANEOUS
  Filled 2023-09-02: qty 0.08

## 2023-09-02 MED ORDER — SODIUM CHLORIDE (PF) 0.9 % IJ SOLN
INTRAMUSCULAR | Status: AC
  Filled 2023-09-02: qty 50

## 2023-09-02 MED ORDER — INSULIN ASPART 100 UNIT/ML IJ SOLN
0.0000 [IU] | Freq: Every day | INTRAMUSCULAR | Status: DC
  Filled 2023-09-02: qty 0.05

## 2023-09-02 MED ORDER — SODIUM CHLORIDE 0.9% FLUSH
3.0000 mL | Freq: Two times a day (BID) | INTRAVENOUS | Status: DC
  Administered 2023-09-02 – 2023-09-04 (×4): 3 mL via INTRAVENOUS

## 2023-09-02 MED ORDER — ACETAMINOPHEN 325 MG PO TABS
650.0000 mg | ORAL_TABLET | Freq: Four times a day (QID) | ORAL | Status: DC | PRN
  Administered 2023-09-03: 650 mg via ORAL
  Filled 2023-09-02: qty 2

## 2023-09-02 MED ORDER — POTASSIUM CHLORIDE 10 MEQ/100ML IV SOLN
10.0000 meq | Freq: Once | INTRAVENOUS | Status: AC
  Administered 2023-09-02: 10 meq via INTRAVENOUS
  Filled 2023-09-02: qty 100

## 2023-09-02 MED ORDER — LACTATED RINGERS IV BOLUS
20.0000 mL/kg | Freq: Once | INTRAVENOUS | Status: AC
  Administered 2023-09-02: 1130 mL via INTRAVENOUS

## 2023-09-02 MED ORDER — ACETAMINOPHEN 650 MG RE SUPP: 650.0000 mg | Freq: Four times a day (QID) | RECTAL | Status: DC | PRN

## 2023-09-02 MED ORDER — HALOPERIDOL LACTATE 5 MG/ML IJ SOLN
2.0000 mg | Freq: Once | INTRAMUSCULAR | Status: AC
  Administered 2023-09-02: 2 mg via INTRAVENOUS
  Filled 2023-09-02: qty 1

## 2023-09-02 MED ORDER — INSULIN GLARGINE-YFGN 100 UNIT/ML ~~LOC~~ SOLN
10.0000 [IU] | Freq: Once | SUBCUTANEOUS | Status: AC
  Administered 2023-09-02: 10 [IU] via SUBCUTANEOUS
  Filled 2023-09-02: qty 0.1

## 2023-09-02 MED ORDER — IOHEXOL 300 MG/ML  SOLN
100.0000 mL | Freq: Once | INTRAMUSCULAR | Status: AC | PRN
  Administered 2023-09-02: 100 mL via INTRAVENOUS

## 2023-09-02 MED ORDER — PANTOPRAZOLE SODIUM 40 MG IV SOLR
40.0000 mg | Freq: Once | INTRAVENOUS | Status: AC
  Administered 2023-09-02: 40 mg via INTRAVENOUS
  Filled 2023-09-02: qty 10

## 2023-09-02 MED ORDER — POLYETHYLENE GLYCOL 3350 17 G PO PACK: 17.0000 g | PACK | Freq: Every day | ORAL | Status: DC | PRN

## 2023-09-02 MED ORDER — INSULIN ASPART 100 UNIT/ML IJ SOLN
10.0000 [IU] | Freq: Once | INTRAMUSCULAR | Status: AC
  Administered 2023-09-02: 10 [IU] via SUBCUTANEOUS
  Filled 2023-09-02: qty 0.1

## 2023-09-02 MED ORDER — INSULIN GLARGINE (1 UNIT DIAL) 300 UNIT/ML ~~LOC~~ SOPN: 12.0000 [IU] | PEN_INJECTOR | Freq: Every day | SUBCUTANEOUS | Status: DC

## 2023-09-02 MED ORDER — LABETALOL HCL 5 MG/ML IV SOLN
20.0000 mg | INTRAVENOUS | Status: DC | PRN
  Administered 2023-09-03: 20 mg via INTRAVENOUS
  Filled 2023-09-02 (×2): qty 4

## 2023-09-02 MED ORDER — DEXTROSE IN LACTATED RINGERS 5 % IV SOLN: INTRAVENOUS | Status: DC

## 2023-09-02 MED ORDER — PANTOPRAZOLE SODIUM 40 MG PO TBEC
40.0000 mg | DELAYED_RELEASE_TABLET | Freq: Two times a day (BID) | ORAL | Status: DC
  Administered 2023-09-03 – 2023-09-04 (×3): 40 mg via ORAL
  Filled 2023-09-02 (×3): qty 1

## 2023-09-02 MED ORDER — ASPIRIN 81 MG PO TBEC
81.0000 mg | DELAYED_RELEASE_TABLET | Freq: Every day | ORAL | Status: DC
  Administered 2023-09-03 – 2023-09-04 (×2): 81 mg via ORAL
  Filled 2023-09-02 (×2): qty 1

## 2023-09-02 MED ORDER — DROPERIDOL 2.5 MG/ML IJ SOLN
2.5000 mg | Freq: Once | INTRAMUSCULAR | Status: AC
  Administered 2023-09-02: 2.5 mg via INTRAVENOUS
  Filled 2023-09-02: qty 2

## 2023-09-02 MED ORDER — RANOLAZINE ER 500 MG PO TB12
500.0000 mg | ORAL_TABLET | Freq: Two times a day (BID) | ORAL | Status: DC
  Administered 2023-09-03 – 2023-09-04 (×3): 500 mg via ORAL
  Filled 2023-09-02 (×5): qty 1

## 2023-09-02 MED ORDER — LEVOTHYROXINE SODIUM 137 MCG PO TABS
137.0000 ug | ORAL_TABLET | Freq: Every day | ORAL | Status: DC
  Administered 2023-09-03 – 2023-09-04 (×2): 137 ug via ORAL
  Filled 2023-09-02 (×2): qty 1

## 2023-09-02 MED ORDER — ROSUVASTATIN CALCIUM 20 MG PO TABS
10.0000 mg | ORAL_TABLET | Freq: Every day | ORAL | Status: DC
  Administered 2023-09-03: 10 mg via ORAL
  Filled 2023-09-02: qty 1

## 2023-09-02 NOTE — Assessment & Plan Note (Addendum)
Largely resolved. Suspect this is due to patient not taking her insulin, her anion gap was minimally elevated, patient does not appear toxic.  At this time we will restart patient's sliding scale insulin as well as her long-acting insulin and monitor.  Patient has been ordered for BMP every 4 hourly, if this is trending in the right direction I anticipate stopping further BMPs. Patient had mild DKA at presentation. Change fludis to dextrose containging fluids. Restart diet in AM, at this time, patient dos not want diet.

## 2023-09-02 NOTE — Assessment & Plan Note (Addendum)
Patient does not seem to be on any chronic medications for this on the contrary she is on midodrine.  At this time I suspect patient's high blood pressure here in the ER is due to her acute illness, will control with as needed agents and start oral agents only if persistent hypertension.  Hold midodrine at this time, resume same if hypotension/orthostatic hypotension develops.

## 2023-09-02 NOTE — ED Notes (Signed)
ED TO INPATIENT HANDOFF REPORT  ED Nurse Name and Phone #: Suann Larry Name/Age/Gender [name] [name] 62 y.o. female Room/Bed: WA01/WA01  Code Status   Code Status: Full Code  Home/SNF/Other Home Patient oriented to: self, place, time, and situation Is this baseline? Yes   Triage Complete: Triage complete  Chief Complaint Hyperglycemia [R73.9]  Triage Note Pt BIBA from home with c/o abd pain and urinary retention. Hx diabetes, non compliant.   100 mcg fentanyl   20 LAC CBG High  170/100 HR 90   Allergies Allergies  Allergen Reactions   Codeine Hives, Anxiety and Other (See Comments)    Level of Care/Admitting Diagnosis ED Disposition     ED Disposition  Admit   Condition  --   Comment  Hospital Area: [facility] [[id number]]  Level of Care: Med-Surg [16]  May place patient in observation at [facility] or [facility] if equivalent level of care is available:: No  Covid Evaluation: Asymptomatic - no recent exposure (last 10 days) testing not required  Diagnosis: Hyperglycemia [161096]  Admitting Physician: Nolberto Hanlon [[id number]]  Attending Physician: Nolberto Hanlon [[id number]]          B Medical/Surgery History Past Medical History:  Diagnosis Date   Allergy    seasonal   Anxiety    B12 deficiency    Chest pain 04/05/2017   CHF (congestive heart failure) ([name])    Diabetes mellitus type 1, uncontrolled    "dr. 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) 10/29/2013   Leg cramping    "@ night" (09/18/2013)   Migraine    "once q couple months" (11/26/2018)   Osteoporosis    Peripheral neuropathy    Umbilical hernia    unrepaired (09/18/2013)   Past Surgical History:   Procedure Laterality Date   BIOPSY  06/03/2019   Procedure: BIOPSY;  Surgeon: Lynann Bologna, MD;  Location: [facility];  Service: Endoscopy;;   BIOPSY  08/17/2020   Procedure: BIOPSY;  Surgeon: Tressia Danas, MD;  Location: [facility];  Service: Gastroenterology;;   BIOPSY  02/05/2023   Procedure: BIOPSY;  Surgeon: Meryl Dare, MD;  Location: [facility];  Service: Gastroenterology;;   CESAREAN SECTION  1989   ENTEROSCOPY N/A 06/03/2019   Procedure: ENTEROSCOPY;  Surgeon: Lynann Bologna, MD;  Location: [facility];  Service: Endoscopy;  Laterality: N/A;   ESOPHAGOGASTRODUODENOSCOPY (EGD) WITH PROPOFOL N/A 08/17/2020   Procedure: ESOPHAGOGASTRODUODENOSCOPY (EGD) WITH PROPOFOL;  Surgeon: Tressia Danas, MD;  Location: [facility];  Service: Gastroenterology;  Laterality: N/A;   ESOPHAGOGASTRODUODENOSCOPY (EGD) WITH PROPOFOL N/A 02/05/2023   Procedure: ESOPHAGOGASTRODUODENOSCOPY (EGD) WITH PROPOFOL;  Surgeon: Meryl Dare, MD;  Location: [facility];  Service: Gastroenterology;  Laterality: N/A;   LAPAROSCOPIC ASSISTED VAGINAL HYSTERECTOMY  09/23/2012   Procedure: LAPAROSCOPIC ASSISTED VAGINAL HYSTERECTOMY;  Surgeon: Mitchel Honour, DO;  Location: [facility];  Service: Gynecology;  Laterality: N/A;  pull Dr Vance Gather instrument   LEFT HEART CATH AND CORONARY ANGIOGRAPHY N/A 02/11/2017   Procedure: Left Heart Cath and Coronary Angiography;  Surgeon: Runell Gess, MD;  Location: [facility] INVASIVE CV LAB;  Service: Cardiovascular;  Laterality: N/A;   TUBAL LIGATION Bilateral 1992     A IV Location/Drains/Wounds  Patient Lines/Drains/Airways Status     Active Line/Drains/Airways     Name Placement date Placement time Site Days   Peripheral IV 09/02/23 20 G 1" Left Antecubital 09/02/23  1018  Antecubital  less than 1   External Urinary Catheter 09/02/23  1135  --  less than 1            Intake/Output Last 24 hours  Intake/Output Summary (Last 24 hours) at 09/02/2023 1717 Last data  filed at 09/02/2023 1523 Gross per 24 hour  Intake 98.55 ml  Output --  Net 98.55 ml    Labs/Imaging Results for orders placed or performed during the hospital encounter of 09/02/23 (from the past 48 hour(s))  Urinalysis, Routine w reflex microscopic -Urine, Clean Catch     Status: Abnormal   Collection Time: 09/02/23 10:24 AM  Result Value Ref Range   Color, Urine STRAW (A) YELLOW   APPearance CLEAR CLEAR   Specific Gravity, Urine 1.024 1.005 - 1.030   pH 7.0 5.0 - 8.0   Glucose, UA >=500 (A) NEGATIVE mg/dL   Hgb urine dipstick NEGATIVE NEGATIVE   Bilirubin Urine NEGATIVE NEGATIVE   Ketones, ur 5 (A) NEGATIVE mg/dL   Protein, ur 30 (A) NEGATIVE mg/dL   Nitrite NEGATIVE NEGATIVE   Leukocytes,Ua NEGATIVE NEGATIVE   RBC / HPF 0-5 0 - 5 RBC/hpf   WBC, UA 0-5 0 - 5 WBC/hpf   Bacteria, UA NONE SEEN NONE SEEN   Squamous Epithelial / HPF 0-5 0 - 5 /HPF    Comment: Performed at [facility], [address], [place], [place] [address]  CBG monitoring, ED     Status: Abnormal   Collection Time: 09/02/23 10:39 AM  Result Value Ref Range   Glucose-Capillary 533 (HH) 70 - 99 mg/dL    Comment: Glucose reference range applies only to samples taken after fasting for at least 8 hours.   Comment 1 Notify RN   Basic metabolic panel     Status: Abnormal   Collection Time: 09/02/23 10:44 AM  Result Value Ref Range   Sodium 134 (L) 135 - 145 mmol/L   Potassium 3.5 3.5 - 5.1 mmol/L   Chloride 92 (L) 98 - 111 mmol/L   CO2 24 22 - 32 mmol/L   Glucose, Bld 569 (HH) 70 - 99 mg/dL    Comment: CRITICAL RESULT CALLED TO, READ BACK BY AND VERIFIED WITH RN S LAMB AT 1135 09/02/23 CRU[place]ANK A Glucose reference range applies only to samples taken after fasting for at least 8 hours.    BUN 25 (H) 6 - 20 mg/dL   Creatinine, Ser 1.91 0.44 - 1.00 mg/dL   Calcium 9.4 8.9 - 47.8 mg/dL   GFR, Estimated >29 >56 mL/min    Comment: (NOTE) Calculated using the CKD-EPI Creatinine Equation  (2021)    Anion gap 18 (H) 5 - 15    Comment: Performed at [facility], [address], [place], [place] [address]  Beta-hydroxybutyric acid     Status: Abnormal   Collection Time: 09/02/23 10:44 AM  Result Value Ref Range   Beta-Hydroxybutyric Acid 1.42 (H) 0.05 - 0.27 mmol/L    Comment: Performed at [facility], [address], [place], [place] [address]  CBC with Differential (PNL)     Status: Abnormal   Collection Time: 09/02/23 10:44 AM  Result Value Ref Range   WBC 12.2 (H) 4.0 - 10.5 K/uL   RBC 5.08 3.87 - 5.11 MIL/uL  Hemoglobin 14.8 12.0 - 15.0 g/dL   HCT 29.5 28.4 - 13.2 %   MCV 85.8 80.0 - 100.0 fL   MCH 29.1 26.0 - 34.0 pg   MCHC 33.9 30.0 - 36.0 g/dL   RDW 44.0 10.2 - 72.5 %   Platelets 366 150 - 400 K/uL   nRBC 0.0 0.0 - 0.2 %   Neutrophils Relative % 81 %   Neutro Abs 9.8 (H) 1.7 - 7.7 K/uL   Lymphocytes Relative 14 %   Lymphs Abs 1.7 0.7 - 4.0 K/uL   Monocytes Relative 4 %   Monocytes Absolute 0.4 0.1 - 1.0 K/uL   Eosinophils Relative 0 %   Eosinophils Absolute 0.0 0.0 - 0.5 K/uL   Basophils Relative 0 %   Basophils Absolute 0.1 0.0 - 0.1 K/uL   Immature Granulocytes 1 %   Abs Immature Granulocytes 0.15 (H) 0.00 - 0.07 K/uL    Comment: Performed at [facility], [address] [place], [place] [address]  Blood gas, venous     Status: Abnormal   Collection Time: 09/02/23 10:45 AM  Result Value Ref Range   pH, Ven 7.46 (H) 7.25 - 7.43   pCO2, Ven 40 (L) 44 - 60 mmHg   pO2, Ven 58 (H) 32 - 45 mmHg   Bicarbonate 28.4 (H) 20.0 - 28.0 mmol/L   Acid-Base Excess 4.2 (H) 0.0 - 2.0 mmol/L   O2 Saturation 89.3 %   Patient temperature 37.0     Comment: Performed at [facility], [address] [place], [place] [address]  CBG monitoring, ED     Status: Abnormal   Collection Time: 09/02/23 12:26 PM  Result Value Ref Range   Glucose-Capillary 457 (H) 70 - 99 mg/dL    Comment: Glucose  reference range applies only to samples taken after fasting for at least 8 hours.  Basic metabolic panel     Status: Abnormal   Collection Time: 09/02/23  1:21 PM  Result Value Ref Range   Sodium 135 135 - 145 mmol/L   Potassium 4.0 3.5 - 5.1 mmol/L   Chloride 97 (L) 98 - 111 mmol/L   CO2 25 22 - 32 mmol/L   Glucose, Bld 449 (H) 70 - 99 mg/dL    Comment: Glucose reference range applies only to samples taken after fasting for at least 8 hours.   BUN 20 6 - 20 mg/dL   Creatinine, Ser 2.59 0.44 - 1.00 mg/dL   Calcium 8.3 (L) 8.9 - 10.3 mg/dL   GFR, Estimated >56 >38 mL/min    Comment: (NOTE) Calculated using the CKD-EPI Creatinine Equation (2021)    Anion gap 13 5 - 15    Comment: Performed at [facility] [place], [address] [place], [place] [address]   CT ABDOMEN PELVIS W CONTRAST  Result Date: 09/02/2023 CLINICAL DATA:  Abdominal pain, acute, nonlocalized. Urinary retention. EXAM: CT ABDOMEN AND PELVIS WITH CONTRAST TECHNIQUE: Multidetector CT imaging of the abdomen and pelvis was performed using the standard protocol following bolus administration of intravenous contrast. RADIATION DOSE REDUCTION: This exam was performed according to the departmental dose-optimization program which includes automated exposure control, adjustment of the mA and/or kV according to patient size and/or use of iterative reconstruction technique. CONTRAST:  OMNIPAQUE IOHEXOL 300 MG/ML  SOLN COMPARISON:  08/22/2023. FINDINGS: Lower chest: No acute findings. Heart is at the upper limits of normal in size. No pericardial or pleural effusion. Distal esophagus is grossly unremarkable. Hepatobiliary:  Liver is enlarged, 23.0 cm. Liver and gallbladder are otherwise unremarkable. No biliary ductal dilatation. Pancreas: Negative. Spleen: Negative. Adrenals/Urinary Tract: Adrenal glands and kidneys are unremarkable. Ureters are decompressed. Bladder is thick-walled and decompressed with a Foley catheter in  place. Air within the bladder is presumably iatrogenic in etiology. Stomach/Bowel: Stomach, small bowel, appendix and colon are unremarkable. Vascular/Lymphatic: Atherosclerotic calcification of the aorta. No pathologically enlarged lymph nodes. Reproductive: Hysterectomy.  No adnexal mass. Other: No free fluid. Mesenteries and peritoneum are unremarkable. Small umbilical hernia contains fat. Musculoskeletal: Degenerative changes in the spine. IMPRESSION: 1. No acute findings. 2. Bladder wall thickening may be due to under distension and/or subacute/chronic cystitis. Air within the bladder is presumably due to Foley catheter placement. 3. Hepatomegaly. 4.  Aortic atherosclerosis (ICD10-I70.0). Electronically Signed   By: Leanna Battles M.D.   On: 09/02/2023 15:21    Pending Labs Unresulted Labs (From admission, onward)     Start     Ordered   09/09/23 0500  Creatinine, serum  (enoxaparin (LOVENOX)    CrCl >/= 30 ml/min)  Weekly,   R     Comments: while on enoxaparin therapy    09/02/23 1551   09/03/23 0500  APTT  Tomorrow morning,   R        09/02/23 1551   09/03/23 0500  Protime-INR  Tomorrow morning,   R        09/02/23 1551   09/03/23 0500  Basic metabolic panel  Tomorrow morning,   R        09/02/23 1551   09/03/23 0500  CBC  Tomorrow morning,   R        09/02/23 1551   09/02/23 1549  CBC  (enoxaparin (LOVENOX)    CrCl >/= 30 ml/min)  Once,   R       Comments: Baseline for enoxaparin therapy IF NOT ALREADY DRAWN.  Notify MD if PLT < 100 K.    09/02/23 1551   09/02/23 1549  Creatinine, serum  (enoxaparin (LOVENOX)    CrCl >/= 30 ml/min)  Once,   R       Comments: Baseline for enoxaparin therapy IF NOT ALREADY DRAWN.    09/02/23 1551   09/02/23 1024  Basic metabolic panel  (Diabetes Ketoacidosis (DKA))  STAT Now then every 4 hours ,   STAT      09/02/23 1024   09/02/23 1024  Beta-hydroxybutyric acid  (Diabetes Ketoacidosis (DKA))  Now then every 8 hours,   STAT (with URGENT  occurrences)      09/02/23 1024            Vitals/Pain Today's Vitals   09/02/23 1019 09/02/23 1245 09/02/23 1523  BP: (!) 171/104 (!) 157/89   Pulse: 100 (!) 110   Resp: 20 18   Temp: 97.6 F (36.4 C) 97.8 F (36.6 C)   TempSrc: Oral Oral   SpO2: 93% 94%   PainSc:   8     Isolation Precautions No active isolations  Medications Medications  insulin aspart (novoLOG) injection 0-15 Units (has no administration in time range)  insulin aspart (novoLOG) injection 0-5 Units (has no administration in time range)  enoxaparin (LOVENOX) injection 40 mg (has no administration in time range)  acetaminophen (TYLENOL) tablet 650 mg (has no administration in time range)    Or  acetaminophen (TYLENOL) suppository 650 mg (has no administration in time range)  polyethylene glycol (MIRALAX / GLYCOLAX) packet 17 g (has no administration in time range)  sodium chloride flush (NS) 0.9 % injection 3 mL (has no administration in time range)  lactated ringers 1,000 mL with potassium chloride 20 mEq infusion (has no administration in time range)  lactated ringers bolus 1,130 mL (1,130 mLs Intravenous New Bag/Given 09/02/23 1052)  haloperidol lactate (HALDOL) injection 2 mg (2 mg Intravenous Given 09/02/23 1111)  insulin glargine-yfgn (SEMGLEE) injection 10 Units (10 Units Subcutaneous Given 09/02/23 1249)  insulin aspart (novoLOG) injection 8 Units (8 Units Subcutaneous Given 09/02/23 1249)  potassium chloride 10 mEq in 100 mL IVPB (0 mEq Intravenous Stopped 09/02/23 1523)  lactated ringers bolus 1,000 mL (1,000 mLs Intravenous New Bag/Given 09/02/23 1323)  iohexol (OMNIPAQUE) 300 MG/ML solution 100 mL (100 mLs Intravenous Contrast Given 09/02/23 1355)  insulin aspart (novoLOG) injection 10 Units (10 Units Subcutaneous Given 09/02/23 1520)  droperidol (INAPSINE) 2.5 MG/ML injection 2.5 mg (2.5 mg Intravenous Given 09/02/23 1545)    Mobility walks with person assist     Focused Assessments Abdominal  Pain, Hyperglycemia   R Recommendations: See Admitting Provider Note  Report given to:   Additional Notes: .

## 2023-09-02 NOTE — ED Triage Notes (Signed)
Pt BIBA from home with c/o abd pain and urinary retention. Hx diabetes, non compliant.   100 mcg fentanyl   20 LAC CBG High  170/100 HR 90

## 2023-09-02 NOTE — ED Provider Notes (Signed)
New  EMERGENCY DEPARTMENT AT [facility] Provider Note   CSN: [id number] Arrival date & time: 09/02/23  1006     History  Chief Complaint  Patient presents with   Abdominal Pain   Hyperglycemia    [name] [name] is a 62 y.o. female.  This is a 62 year old female who is here today for vomiting and inability to urinate.  Patient has a history of insulin-dependent diabetes, has a history of poor compliance with her insulin regiment.  Patient has also had problems with urinary retention.  Patient has me that she last took insulin last night.  Glucose reading was "high "for EMS.   Abdominal Pain Hyperglycemia Associated symptoms: abdominal pain        Home Medications Prior to Admission medications   Medication Sig Start Date End Date Taking? Authorizing Provider  aspirin EC 81 MG tablet Take 81 mg by mouth daily. Swallow whole.    [provider]  Continuous Blood Gluc Sensor (DEXCOM G6 SENSOR) MISC INJECT 1 SENSOR INTO SKIN EVERY 10 DAYS Patient taking differently: Inject 1 Device into the skin See admin instructions. Place 1 new device into the skin every 10 days 07/18/22   Excell Seltzer, MD  Continuous Blood Gluc Transmit (DEXCOM G6 TRANSMITTER) MISC USE AS DIRECTED FOR CONTINUOUS GLUCOSE MONITORING. REUSE TRANSMITTER X 90DAYS THEN DISCARD & REPLACE 07/05/21   Bedsole, Amy E, MD  Glucagon, rDNA, (GLUCAGON EMERGENCY) 1 MG KIT Inject 1 mg into the skin See admin instructions. Inject one mg into the skin see administration instructions. Follow package directions for low blood sugar. 06/18/23   [provider]  Insulin Pen Needle (PEN NEEDLES) 31G X 8 MM MISC Use to inject insulin 4 times a day.  Dx: E11.10 07/21/21   Excell Seltzer, MD  Lancets (ONETOUCH ULTRASOFT) lancets Use to check blood sugar daily as needed.  Dx: E11.10 12/23/17   Excell Seltzer, MD  levothyroxine (SYNTHROID) 137 MCG tablet Take 137 mcg by mouth daily before breakfast. 03/19/22    [provider]  metoCLOPramide (REGLAN) 5 MG tablet Take 1 tablet (5 mg total) by mouth 3 (three) times daily before meals. 02/06/23   Albertine Grates, MD  midodrine (PROAMATINE) 5 MG tablet Take 1 tablet (5 mg total) by mouth 2 (two) times daily with a meal. Hold if your systolic blood pressure  ( top number) is > , please check your blood pressure at home once a day and bring in record for your doctor to review, follow up with your pcp and cardiology regarding your blood pressure medication Patient taking differently: Take 5 mg by mouth See admin instructions. Take 5 mg by mouth in the morning and hold if the Systolic number is greater than 150 02/06/23   Albertine Grates, MD  NOVOLOG FLEXPEN 100 UNIT/ML FlexPen Inject 4 Units into the skin See admin instructions. Inject 4 units into the skin three times a day after meals, PLUS sliding scale coverage    [provider]  pantoprazole (PROTONIX) 40 MG tablet Take 1 tablet (40 mg total) by mouth 2 (two) times daily. 02/06/23 08/22/23  Albertine Grates, MD  ranolazine (RANEXA) 500 MG 12 hr tablet Take 500 mg by mouth 2 (two) times daily. 07/23/23   [provider]  rosuvastatin (CRESTOR) 10 MG tablet Take 10 mg by mouth at bedtime. 06/17/23   [provider]  TOUJEO SOLOSTAR 300 UNIT/ML Solostar Pen Inject 12 Units into the skin at bedtime. 08/24/23  Lewie Chamber, MD  zolpidem (AMBIEN) 5 MG tablet Take 2.5 mg by mouth at bedtime as needed for sleep.    [provider]      Allergies    Codeine    Review of Systems   Review of Systems  Gastrointestinal:  Positive for abdominal pain.    Physical Exam Updated Vital Signs BP (!) 157/89 (BP Location: Right Arm)   Pulse (!) 110   Temp 97.8 F (36.6 C) (Oral)   Resp 18   LMP 08/11/2012   SpO2 94%  Physical Exam Vitals reviewed.  Constitutional:      Appearance: She is ill-appearing.  Cardiovascular:     Rate and Rhythm: Tachycardia present.  Pulmonary:     Effort:  Pulmonary effort is normal.  Abdominal:     General: There is distension.     Comments: Suprapubic fullness  Skin:    General: Skin is warm and dry.  Neurological:     General: No focal deficit present.     Mental Status: She is alert.     ED Results / Procedures / Treatments   Labs (all labs ordered are listed, but only abnormal results are displayed) Labs Reviewed  BASIC METABOLIC PANEL - Abnormal; Notable for the following components:      Result Value   Sodium 134 (*)    Chloride 92 (*)    Glucose, Bld 569 (*)    BUN 25 (*)    Anion gap 18 (*)    All other components within normal limits  BETA-HYDROXYBUTYRIC ACID - Abnormal; Notable for the following components:   Beta-Hydroxybutyric Acid 1.42 (*)    All other components within normal limits  CBC WITH DIFFERENTIAL/PLATELET - Abnormal; Notable for the following components:   WBC 12.2 (*)    Neutro Abs 9.8 (*)    Abs Immature Granulocytes 0.15 (*)    All other components within normal limits  URINALYSIS, ROUTINE W REFLEX MICROSCOPIC - Abnormal; Notable for the following components:   Color, Urine STRAW (*)    Glucose, UA >=500 (*)    Ketones, ur 5 (*)    Protein, ur 30 (*)    All other components within normal limits  BLOOD GAS, VENOUS - Abnormal; Notable for the following components:   pH, Ven 7.46 (*)    pCO2, Ven 40 (*)    pO2, Ven 58 (*)    Bicarbonate 28.4 (*)    Acid-Base Excess 4.2 (*)    All other components within normal limits  BASIC METABOLIC PANEL - Abnormal; Notable for the following components:   Chloride 97 (*)    Glucose, Bld 449 (*)    Calcium 8.3 (*)    All other components within normal limits  CBG MONITORING, ED - Abnormal; Notable for the following components:   Glucose-Capillary 533 (*)    All other components within normal limits  CBG MONITORING, ED - Abnormal; Notable for the following components:   Glucose-Capillary 457 (*)    All other components within normal limits  BASIC METABOLIC  PANEL  BASIC METABOLIC PANEL  BASIC METABOLIC PANEL  BETA-HYDROXYBUTYRIC ACID  BASIC METABOLIC PANEL  BETA-HYDROXYBUTYRIC ACID    EKG None  Radiology CT ABDOMEN PELVIS W CONTRAST  Result Date: 09/02/2023 CLINICAL DATA:  Abdominal pain, acute, nonlocalized. Urinary retention. EXAM: CT ABDOMEN AND PELVIS WITH CONTRAST TECHNIQUE: Multidetector CT imaging of the abdomen and pelvis was performed using the standard protocol following bolus administration of intravenous contrast. RADIATION DOSE REDUCTION: This  exam was performed according to the departmental dose-optimization program which includes automated exposure control, adjustment of the mA and/or kV according to patient size and/or use of iterative reconstruction technique. CONTRAST:  OMNIPAQUE IOHEXOL 300 MG/ML  SOLN COMPARISON:  08/22/2023. FINDINGS: Lower chest: No acute findings. Heart is at the upper limits of normal in size. No pericardial or pleural effusion. Distal esophagus is grossly unremarkable. Hepatobiliary: Liver is enlarged, 23.0 cm. Liver and gallbladder are otherwise unremarkable. No biliary ductal dilatation. Pancreas: Negative. Spleen: Negative. Adrenals/Urinary Tract: Adrenal glands and kidneys are unremarkable. Ureters are decompressed. Bladder is thick-walled and decompressed with a Foley catheter in place. Air within the bladder is presumably iatrogenic in etiology. Stomach/Bowel: Stomach, small bowel, appendix and colon are unremarkable. Vascular/Lymphatic: Atherosclerotic calcification of the aorta. No pathologically enlarged lymph nodes. Reproductive: Hysterectomy.  No adnexal mass. Other: No free fluid. Mesenteries and peritoneum are unremarkable. Small umbilical hernia contains fat. Musculoskeletal: Degenerative changes in the spine. IMPRESSION: 1. No acute findings. 2. Bladder wall thickening may be due to under distension and/or subacute/chronic cystitis. Air within the bladder is presumably due to Foley catheter  placement. 3. Hepatomegaly. 4.  Aortic atherosclerosis (ICD10-I70.0). Electronically Signed   By: Leanna Battles M.D.   On: 09/02/2023 15:21    Procedures Procedures    Medications Ordered in ED Medications  droperidol (INAPSINE) 2.5 MG/ML injection 2.5 mg (has no administration in time range)  lactated ringers bolus 1,130 mL (1,130 mLs Intravenous New Bag/Given 09/02/23 1052)  haloperidol lactate (HALDOL) injection 2 mg (2 mg Intravenous Given 09/02/23 1111)  insulin glargine-yfgn (SEMGLEE) injection 10 Units (10 Units Subcutaneous Given 09/02/23 1249)  insulin aspart (novoLOG) injection 8 Units (8 Units Subcutaneous Given 09/02/23 1249)  potassium chloride 10 mEq in 100 mL IVPB (0 mEq Intravenous Stopped 09/02/23 1523)  lactated ringers bolus 1,000 mL (1,000 mLs Intravenous New Bag/Given 09/02/23 1323)  iohexol (OMNIPAQUE) 300 MG/ML solution 100 mL (100 mLs Intravenous Contrast Given 09/02/23 1355)  insulin aspart (novoLOG) injection 10 Units (10 Units Subcutaneous Given 09/02/23 1520)    ED Course/ Medical Decision Making/ A&P                                 Medical Decision Making This is a 62 year old female is here today for urinary retention, hyperglycemia.  Differential diagnoses include acute urinary retention, DKA, HHS.  Plan-patient has greater than 700 cc in her bladder after trying to void.  Will have nursing staff place Foley catheter so that we can also monitor urine output as I suspect patient likely has DKA.  Will look for infectious cause of the patient's symptoms, however with her repeat admissions for DKA due to poor compliance I believe that poor compliance is the likely cause.  I reviewed the patient's most recent hospital admission and discharge notes.  Medications reviewed with the patient.  Reassessment-looking at the patient's labs, there is a mild anion gap, elevated bicarbonate is also elevated, not acidotic.  Patient may be presenting with a mixed picture of  symptoms given the vomiting that she is having.  Her beta hydroxybutyrate is elevated, just small amount of ketones in her urine.  I have given her some subcu insulin.  Patient received droperidol with some improvement in her vomiting.  Obtained CT imaging of the patient's abdomen pelvis which did not show acute findings.  Anion gap did improve with fluids and insulin.  Ultimately, I do not  believe that this patient has DKA at this time, however cannot tolerate p.o., is a poorly compliant diabetic.  She has required additional droperidol.  Will admit patient for hyperglycemia, repeat labs to ensure that the patient is not sliding into DKA.  Will admit to hospitalist.  Amount and/or Complexity of Data Reviewed Labs: ordered. Radiology: ordered.  Risk Prescription drug management.           Final Clinical Impression(s) / ED Diagnoses Final diagnoses:  Hyperglycemia    Rx / DC Orders ED Discharge Orders     None         Arletha Pili, DO 09/02/23 1528

## 2023-09-02 NOTE — Inpatient Diabetes Management (Signed)
Inpatient Diabetes Program Recommendations  AACE/ADA: New Consensus Statement on Inpatient Glycemic Control (2015)  Target Ranges:  Prepandial:   less than 140 mg/dL      Peak postprandial:   less than 180 mg/dL (1-2 hours)      Critically ill patients:  140 - 180 mg/dL   Lab Results  Component Value Date   GLUCAP 186 (H) 09/02/2023   HGBA1C >15.5 (H) 08/22/2023    Review of Glycemic Control  Diabetes history: DM1 Outpatient Diabetes medications: Toujeo 12 at bedtime, Novolog 4 units TID with meals + s/s  Current orders for Inpatient glycemic control: Semglee 12 at bedtime, Novolog 0-15 TID with meals and 0-5 HS  HgbA1C - > 15.5% Has Dexcom G6 CGM No DKA, HHS Admitted with hyperglycemia  8th admission in 2024 and multiple ED visits  Will need increase in basal and addition of meal coverage insulin  Inpatient Diabetes Program Recommendations:    Consider Novolog 4 units TID with meals  Consider Semglee 15 units at bedtime  Spoke with pt at bedside regarding HgbA1C of > 15.5%. Pt states she doesn't always take her Toujeo at night, but usually takes her Novolog 4 units a couple times/day. Pt states she hasn't been eating very much and was worried about taking amount of insulin that's prescribed to her. Monitors blood sugars occasionally. Had recently spoke to Diabetes Coordinator on 9/14 regarding importance of taking insulin as prescribed. Pt states she has a lot of stress in her life and this is causing her blood sugars to be out of control. Also admits to not taking meds as prescribed. Spoke with pt about importance of eating regular meals and getting some exercise, which will help with her stress levels. May need f/u with OP Psych. Answered all questions.  Thank you. Ailene Ards, RD, LDN, CDCES Inpatient Diabetes Coordinator [phone]

## 2023-09-02 NOTE — Assessment & Plan Note (Signed)
This seems to be chronic, I do not see the clinical focus of infection.  Patient's bladder wall thickening also appears to be chronic.  Patient does not have leukocytes in the urine that are significant.  We will continue with clinical monitoring

## 2023-09-02 NOTE — Assessment & Plan Note (Addendum)
Has previously had urinary retention with catheterization and at this time is pending an outpatient urology evaluation.  During evaluation in the ER, patient was noted to have about 750 cc of urine in the bladder that she could not void, this may have been the reason for the patient's lower abdominal pain.  At this time pain has resolved, Foley has been placed.  Urology reviewed the case over the phone with the ER attending and per report they plan to still see the patient as an outpatient.  I think at this time it would be best to leave the Foley inside

## 2023-09-02 NOTE — H&P (Signed)
History and Physical    Patient: [name] [name] PIR:[id number] DOB: [birth year] DOA: 09/02/2023 DOS: the patient was seen and examined on 09/02/2023 PCP: Everardo All, NP  Patient coming from: Home  Chief Complaint:  Chief Complaint  Patient presents with   Abdominal Pain   Hyperglycemia   HPI: [name] [name] is a 62 y.o. female with medical history significant of use of marijuana, irritable bowel syndrome as well as type 1 diabetes.  Patient was in her usual state of health till about 48 hours ago, 2 days ago when she reports new onset of generalized abdominal discomfort//stabbing pain.  Without any aggravating or relieving factor without any fever or diarrhea.  Patient reports having had 4 episodes of vomiting that started today.  Prompting her to come to the ER.  Patient additionally did not take her insulin yesterday after the pain started.  Patient's ER course is notable for finding of hyperglycemia mild elevation of anion gap, s/p insulin and IV fluids.  Patient at this time reports no pain, however does not want a diet at this time.  Wants To rest. Review of Systems: As mentioned in the history of present illness. All other systems reviewed and are negative. Past Medical History:  Diagnosis Date   Allergy    seasonal   Anxiety    B12 deficiency    Chest pain 04/05/2017   CHF (congestive heart failure) ([name])    Diabetes mellitus type 1, uncontrolled    "dr. 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) 10/29/2013   Leg cramping    "@ night" (09/18/2013)   Migraine    "once q couple months" (11/26/2018)   Osteoporosis    Peripheral neuropathy    Umbilical hernia    unrepaired (09/18/2013)   Past Surgical History:   Procedure Laterality Date   BIOPSY  06/03/2019   Procedure: BIOPSY;  Surgeon: Lynann Bologna, MD;  Location: [facility];  Service: Endoscopy;;   BIOPSY  08/17/2020   Procedure: BIOPSY;  Surgeon: Tressia Danas, MD;  Location: [facility];  Service: Gastroenterology;;   BIOPSY  02/05/2023   Procedure: BIOPSY;  Surgeon: Meryl Dare, MD;  Location: [facility];  Service: Gastroenterology;;   CESAREAN SECTION  1989   ENTEROSCOPY N/A 06/03/2019   Procedure: ENTEROSCOPY;  Surgeon: Lynann Bologna, MD;  Location: [facility];  Service: Endoscopy;  Laterality: N/A;   ESOPHAGOGASTRODUODENOSCOPY (EGD) WITH PROPOFOL N/A 08/17/2020   Procedure: ESOPHAGOGASTRODUODENOSCOPY (EGD) WITH PROPOFOL;  Surgeon: Tressia Danas, MD;  Location: [facility];  Service: Gastroenterology;  Laterality: N/A;   ESOPHAGOGASTRODUODENOSCOPY (EGD) WITH PROPOFOL N/A 02/05/2023   Procedure: ESOPHAGOGASTRODUODENOSCOPY (EGD) WITH PROPOFOL;  Surgeon: Meryl Dare, MD;  Location: [facility];  Service: Gastroenterology;  Laterality: N/A;   LAPAROSCOPIC ASSISTED VAGINAL HYSTERECTOMY  09/23/2012   Procedure: LAPAROSCOPIC ASSISTED VAGINAL HYSTERECTOMY;  Surgeon: Mitchel Honour, DO;  Location: [facility];  Service: Gynecology;  Laterality: N/A;  pull Dr Vance Gather instrument   LEFT HEART CATH AND CORONARY ANGIOGRAPHY N/A 02/11/2017   Procedure: Left Heart Cath and Coronary Angiography;  Surgeon: Runell Gess, MD;  Location: [facility] CV [facility];  Service: Cardiovascular;  Laterality: N/A;   TUBAL LIGATION Bilateral 1992   Social History:  reports  that she quit smoking about 32 years ago. Her smoking use included cigarettes. She started smoking about 42 years ago. She has a 1.2 pack-year smoking history. She has never used smokeless tobacco. She reports that she does not currently use drugs after having used the following drugs: Marijuana. She reports that she does not drink alcohol.  Allergies  Allergen Reactions   Codeine Hives,  Anxiety and Other (See Comments)    Family History  Problem Relation Age of Onset   Diabetes Other    Heart disease Other    Heart failure Mother 48       Her heart skipped a beat and stopped - per pt   Diabetes Maternal Grandmother    Alzheimer's disease Maternal Grandmother    Coronary artery disease Maternal Grandmother    Heart attack Maternal Grandfather    Heart failure Brother    Diabetes Brother    Colon polyps Neg Hx    Esophageal cancer Neg Hx    Liver cancer Neg Hx    Pancreatic cancer Neg Hx    Stomach cancer Neg Hx    Crohn's disease Neg Hx    Rectal cancer Neg Hx     Prior to Admission medications   Medication Sig Start Date End Date Taking? Authorizing Provider  aspirin EC 81 MG tablet Take 81 mg by mouth daily. Swallow whole.   Yes [provider]  Glucagon, rDNA, (GLUCAGON EMERGENCY) 1 MG KIT Inject 1 mg into the skin See admin instructions. Inject one mg into the skin see administration instructions. Follow package directions for low blood sugar. 06/18/23  Yes [provider]  levothyroxine (SYNTHROID) 137 MCG tablet Take 137 mcg by mouth daily before breakfast. 03/19/22  Yes [provider]  metoCLOPramide (REGLAN) 5 MG tablet Take 1 tablet (5 mg total) by mouth 3 (three) times daily before meals. 02/06/23  Yes Albertine Grates, MD  midodrine (PROAMATINE) 5 MG tablet Take 1 tablet (5 mg total) by mouth 2 (two) times daily with a meal. Hold if your systolic blood pressure  ( top number) is > , please check your blood pressure at home once a day and bring in record for your doctor to review, follow up with your pcp and cardiology regarding your blood pressure medication Patient taking differently: Take 5 mg by mouth See admin instructions. Take 5 mg by mouth in the morning and hold if the Systolic number is greater than 150 02/06/23  Yes Albertine Grates, MD  NOVOLOG FLEXPEN 100 UNIT/ML FlexPen Inject 4 Units into the skin See admin instructions. Inject 4  units into the skin three times a day after meals, PLUS sliding scale coverage   Yes [provider]  pantoprazole (PROTONIX) 40 MG tablet Take 1 tablet (40 mg total) by mouth 2 (two) times daily. 02/06/23 09/02/23 Yes Albertine Grates, MD  ranolazine (RANEXA) 500 MG 12 hr tablet Take 500 mg by mouth 2 (two) times daily. 07/23/23  Yes [provider]  rosuvastatin (CRESTOR) 10 MG tablet Take 10 mg by mouth at bedtime. 06/17/23  Yes [provider]  TOUJEO SOLOSTAR 300 UNIT/ML Solostar Pen Inject 12 Units into the skin at bedtime. 08/24/23  Yes Lewie Chamber, MD  Continuous Blood Gluc Sensor (DEXCOM G6 SENSOR) MISC INJECT 1 SENSOR INTO SKIN EVERY 10 DAYS Patient taking differently: Inject 1 Device into the skin See admin instructions. Place 1 new device into the skin every 10 days 07/18/22   Excell Seltzer, MD  Continuous  Blood Gluc Transmit (DEXCOM G6 TRANSMITTER) MISC USE AS DIRECTED FOR CONTINUOUS GLUCOSE MONITORING. REUSE TRANSMITTER X 90DAYS THEN DISCARD & REPLACE 07/05/21   Excell Seltzer, MD    Physical Exam: Vitals:   09/02/23 1019 09/02/23 1245  BP: (!) 171/104 (!) 157/89  Pulse: 100 (!) 110  Resp: 20 18  Temp: 97.6 F (36.4 C) 97.8 F (36.6 C)  TempSrc: Oral Oral  SpO2: 93% 94%   Oral: Patient does not appear to be distressed.  Wants to rest, appears fatigued Respiratory exam: Bilateral intravesicular Cardiovascular exam S1-S2 normal Abdomen all quadrants are soft nontender Extremities warm without edema Data Reviewed:  Labs on Admission:  Results for orders placed or performed during the hospital encounter of 09/02/23 (from the past 24 hour(s))  Urinalysis, Routine w reflex microscopic -Urine, Clean Catch     Status: Abnormal   Collection Time: 09/02/23 10:24 AM  Result Value Ref Range   Color, Urine STRAW (A) YELLOW   APPearance CLEAR CLEAR   Specific Gravity, Urine 1.024 1.005 - 1.030   pH 7.0 5.0 - 8.0   Glucose, UA >=500 (A) NEGATIVE mg/dL   Hgb urine  dipstick NEGATIVE NEGATIVE   Bilirubin Urine NEGATIVE NEGATIVE   Ketones, ur 5 (A) NEGATIVE mg/dL   Protein, ur 30 (A) NEGATIVE mg/dL   Nitrite NEGATIVE NEGATIVE   Leukocytes,Ua NEGATIVE NEGATIVE   RBC / HPF 0-5 0 - 5 RBC/hpf   WBC, UA 0-5 0 - 5 WBC/hpf   Bacteria, UA NONE SEEN NONE SEEN   Squamous Epithelial / HPF 0-5 0 - 5 /HPF  CBG monitoring, ED     Status: Abnormal   Collection Time: 09/02/23 10:39 AM  Result Value Ref Range   Glucose-Capillary 533 (HH) 70 - 99 mg/dL   Comment 1 Notify RN   Basic metabolic panel     Status: Abnormal   Collection Time: 09/02/23 10:44 AM  Result Value Ref Range   Sodium 134 (L) 135 - 145 mmol/L   Potassium 3.5 3.5 - 5.1 mmol/L   Chloride 92 (L) 98 - 111 mmol/L   CO2 24 22 - 32 mmol/L   Glucose, Bld 569 (HH) 70 - 99 mg/dL   BUN 25 (H) 6 - 20 mg/dL   Creatinine, Ser 4.78 0.44 - 1.00 mg/dL   Calcium 9.4 8.9 - 29.5 mg/dL   GFR, Estimated >62 >13 mL/min   Anion gap 18 (H) 5 - 15  Beta-hydroxybutyric acid     Status: Abnormal   Collection Time: 09/02/23 10:44 AM  Result Value Ref Range   Beta-Hydroxybutyric Acid 1.42 (H) 0.05 - 0.27 mmol/L  CBC with Differential (PNL)     Status: Abnormal   Collection Time: 09/02/23 10:44 AM  Result Value Ref Range   WBC 12.2 (H) 4.0 - 10.5 K/uL   RBC 5.08 3.87 - 5.11 MIL/uL   Hemoglobin 14.8 12.0 - 15.0 g/dL   HCT 08.6 57.8 - 46.9 %   MCV 85.8 80.0 - 100.0 fL   MCH 29.1 26.0 - 34.0 pg   MCHC 33.9 30.0 - 36.0 g/dL   RDW 62.9 52.8 - 41.3 %   Platelets 366 150 - 400 K/uL   nRBC 0.0 0.0 - 0.2 %   Neutrophils Relative % 81 %   Neutro Abs 9.8 (H) 1.7 - 7.7 K/uL   Lymphocytes Relative 14 %   Lymphs Abs 1.7 0.7 - 4.0 K/uL   Monocytes Relative 4 %   Monocytes Absolute 0.4 0.1 - 1.0  K/uL   Eosinophils Relative 0 %   Eosinophils Absolute 0.0 0.0 - 0.5 K/uL   Basophils Relative 0 %   Basophils Absolute 0.1 0.0 - 0.1 K/uL   Immature Granulocytes 1 %   Abs Immature Granulocytes 0.15 (H) 0.00 - 0.07 K/uL   Blood gas, venous     Status: Abnormal   Collection Time: 09/02/23 10:45 AM  Result Value Ref Range   pH, Ven 7.46 (H) 7.25 - 7.43   pCO2, Ven 40 (L) 44 - 60 mmHg   pO2, Ven 58 (H) 32 - 45 mmHg   Bicarbonate 28.4 (H) 20.0 - 28.0 mmol/L   Acid-Base Excess 4.2 (H) 0.0 - 2.0 mmol/L   O2 Saturation 89.3 %   Patient temperature 37.0   CBG monitoring, ED     Status: Abnormal   Collection Time: 09/02/23 12:26 PM  Result Value Ref Range   Glucose-Capillary 457 (H) 70 - 99 mg/dL  Basic metabolic panel     Status: Abnormal   Collection Time: 09/02/23  1:21 PM  Result Value Ref Range   Sodium 135 135 - 145 mmol/L   Potassium 4.0 3.5 - 5.1 mmol/L   Chloride 97 (L) 98 - 111 mmol/L   CO2 25 22 - 32 mmol/L   Glucose, Bld 449 (H) 70 - 99 mg/dL   BUN 20 6 - 20 mg/dL   Creatinine, Ser 1.61 0.44 - 1.00 mg/dL   Calcium 8.3 (L) 8.9 - 10.3 mg/dL   GFR, Estimated >09 >60 mL/min   Anion gap 13 5 - 15  CBG monitoring, ED     Status: Abnormal   Collection Time: 09/02/23  5:56 PM  Result Value Ref Range   Glucose-Capillary 113 (H) 70 - 99 mg/dL   Basic Metabolic Panel: Recent Labs  Lab 09/02/23 1044 09/02/23 1321  NA 134* 135  K 3.5 4.0  CL 92* 97*  CO2 24 25  GLUCOSE 569* 449*  BUN 25* 20  CREATININE 0.52 0.56  CALCIUM 9.4 8.3*   Liver Function Tests: No results for input(s): "AST", "ALT", "ALKPHOS", "BILITOT", "PROT", "ALBUMIN" in the last 168 hours. No results for input(s): "LIPASE", "AMYLASE" in the last 168 hours. No results for input(s): "AMMONIA" in the last 168 hours. CBC: Recent Labs  Lab 09/02/23 1044  WBC 12.2*  NEUTROABS 9.8*  HGB 14.8  HCT 43.6  MCV 85.8  PLT 366   Cardiac Enzymes: No results for input(s): "CKTOTAL", "CKMB", "CKMBINDEX", "TROPONINIHS" in the last 168 hours.  BNP (last 3 results) No results for input(s): "PROBNP" in the last 8760 hours. CBG: Recent Labs  Lab 09/02/23 1039 09/02/23 1226 09/02/23 1756  GLUCAP 533* 457* 113*    Radiological  Exams on Admission:  CT ABDOMEN PELVIS W CONTRAST  Result Date: 09/02/2023 CLINICAL DATA:  Abdominal pain, acute, nonlocalized. Urinary retention. EXAM: CT ABDOMEN AND PELVIS WITH CONTRAST TECHNIQUE: Multidetector CT imaging of the abdomen and pelvis was performed using the standard protocol following bolus administration of intravenous contrast. RADIATION DOSE REDUCTION: This exam was performed according to the departmental dose-optimization program which includes automated exposure control, adjustment of the mA and/or kV according to patient size and/or use of iterative reconstruction technique. CONTRAST:  OMNIPAQUE IOHEXOL 300 MG/ML  SOLN COMPARISON:  08/22/2023. FINDINGS: Lower chest: No acute findings. Heart is at the upper limits of normal in size. No pericardial or pleural effusion. Distal esophagus is grossly unremarkable. Hepatobiliary: Liver is enlarged, 23.0 cm. Liver and gallbladder are otherwise unremarkable. No  biliary ductal dilatation. Pancreas: Negative. Spleen: Negative. Adrenals/Urinary Tract: Adrenal glands and kidneys are unremarkable. Ureters are decompressed. Bladder is thick-walled and decompressed with a Foley catheter in place. Air within the bladder is presumably iatrogenic in etiology. Stomach/Bowel: Stomach, small bowel, appendix and colon are unremarkable. Vascular/Lymphatic: Atherosclerotic calcification of the aorta. No pathologically enlarged lymph nodes. Reproductive: Hysterectomy.  No adnexal mass. Other: No free fluid. Mesenteries and peritoneum are unremarkable. Small umbilical hernia contains fat. Musculoskeletal: Degenerative changes in the spine. IMPRESSION: 1. No acute findings. 2. Bladder wall thickening may be due to under distension and/or subacute/chronic cystitis. Air within the bladder is presumably due to Foley catheter placement. 3. Hepatomegaly. 4.  Aortic atherosclerosis (ICD10-I70.0). Electronically Signed   By: Leanna Battles M.D.   On: 09/02/2023 15:21        Assessment and Plan: Urinary retention Has previously had urinary retention with catheterization and at this time is pending an outpatient urology evaluation.  During evaluation in the ER, patient was noted to have about 750 cc of urine in the bladder that she could not void, this may have been the reason for the patient's lower abdominal pain.  At this time pain has resolved, Foley has been placed.  Urology reviewed the case over the phone with the ER attending and per report they plan to still see the patient as an outpatient.  I think at this time it would be best to leave the Foley inside    Essential hypertension Patient does not seem to be on any chronic medications for this on the contrary she is on midodrine.  At this time I suspect patient's high blood pressure here in the ER is due to her acute illness, will control with as needed agents and start oral agents only if persistent hypertension.  Hold midodrine at this time, resume same if hypotension/orthostatic hypotension develops.  Hyperglycemia Largely resolved. Suspect this is due to patient not taking her insulin, her anion gap was minimally elevated, patient does not appear toxic.  At this time we will restart patient's sliding scale insulin as well as her long-acting insulin and monitor.  Patient has been ordered for BMP every 4 hourly, if this is trending in the right direction I anticipate stopping further BMPs. Patient had mild DKA at presentation. Change fludis to dextrose containging fluids. Restart diet in AM, at this time, patient dos not want diet.  Leukocytosis This seems to be chronic, I do not see the clinical focus of infection.  Patient's bladder wall thickening also appears to be chronic.  Patient does not have leukocytes in the urine that are significant.  We will continue with clinical monitoring      Advance Care Planning:   Code Status: Full Code discussed with patient.  Consults: outaptient urology  care.  Family Communication: per patient.  Severity of Illness: The appropriate patient status for this patient is OBSERVATION. Observation status is judged to be reasonable and necessary in order to provide the required intensity of service to ensure the patient's safety. The patient's presenting symptoms, physical exam findings, and initial radiographic and laboratory data in the context of their medical condition is felt to place them at decreased risk for further clinical deterioration. Furthermore, it is anticipated that the patient will be medically stable for discharge from the hospital within 2 midnights of admission.   Author: Nolberto Hanlon, MD 09/02/2023 6:37 PM  For on call review [email or web address].

## 2023-09-03 ENCOUNTER — Other Ambulatory Visit: Payer: Self-pay

## 2023-09-03 ENCOUNTER — Encounter (HOSPITAL_COMMUNITY): Payer: Self-pay | Admitting: Internal Medicine

## 2023-09-03 DIAGNOSIS — Z91148 Patient's other noncompliance with medication regimen for other reason: Secondary | ICD-10-CM | POA: Diagnosis not present

## 2023-09-03 DIAGNOSIS — Z885 Allergy status to narcotic agent status: Secondary | ICD-10-CM | POA: Diagnosis not present

## 2023-09-03 DIAGNOSIS — Z79890 Hormone replacement therapy (postmenopausal): Secondary | ICD-10-CM | POA: Diagnosis not present

## 2023-09-03 DIAGNOSIS — T383X6A Underdosing of insulin and oral hypoglycemic [antidiabetic] drugs, initial encounter: Secondary | ICD-10-CM | POA: Diagnosis present

## 2023-09-03 DIAGNOSIS — Z79899 Other long term (current) drug therapy: Secondary | ICD-10-CM | POA: Diagnosis not present

## 2023-09-03 DIAGNOSIS — D72829 Elevated white blood cell count, unspecified: Secondary | ICD-10-CM | POA: Diagnosis not present

## 2023-09-03 DIAGNOSIS — E785 Hyperlipidemia, unspecified: Secondary | ICD-10-CM | POA: Diagnosis present

## 2023-09-03 DIAGNOSIS — R339 Retention of urine, unspecified: Secondary | ICD-10-CM | POA: Diagnosis not present

## 2023-09-03 DIAGNOSIS — E1042 Type 1 diabetes mellitus with diabetic polyneuropathy: Secondary | ICD-10-CM | POA: Diagnosis present

## 2023-09-03 DIAGNOSIS — E1065 Type 1 diabetes mellitus with hyperglycemia: Secondary | ICD-10-CM | POA: Diagnosis present

## 2023-09-03 DIAGNOSIS — K219 Gastro-esophageal reflux disease without esophagitis: Secondary | ICD-10-CM | POA: Diagnosis present

## 2023-09-03 DIAGNOSIS — R739 Hyperglycemia, unspecified: Secondary | ICD-10-CM | POA: Diagnosis not present

## 2023-09-03 DIAGNOSIS — Z87891 Personal history of nicotine dependence: Secondary | ICD-10-CM | POA: Diagnosis not present

## 2023-09-03 DIAGNOSIS — J302 Other seasonal allergic rhinitis: Secondary | ICD-10-CM | POA: Diagnosis present

## 2023-09-03 DIAGNOSIS — Z7982 Long term (current) use of aspirin: Secondary | ICD-10-CM | POA: Diagnosis not present

## 2023-09-03 DIAGNOSIS — Z794 Long term (current) use of insulin: Secondary | ICD-10-CM | POA: Diagnosis not present

## 2023-09-03 DIAGNOSIS — I2510 Atherosclerotic heart disease of native coronary artery without angina pectoris: Secondary | ICD-10-CM | POA: Diagnosis present

## 2023-09-03 DIAGNOSIS — I10 Essential (primary) hypertension: Secondary | ICD-10-CM | POA: Diagnosis not present

## 2023-09-03 DIAGNOSIS — Z833 Family history of diabetes mellitus: Secondary | ICD-10-CM | POA: Diagnosis not present

## 2023-09-03 DIAGNOSIS — E039 Hypothyroidism, unspecified: Secondary | ICD-10-CM | POA: Diagnosis present

## 2023-09-03 DIAGNOSIS — Z90710 Acquired absence of both cervix and uterus: Secondary | ICD-10-CM | POA: Diagnosis not present

## 2023-09-03 DIAGNOSIS — I129 Hypertensive chronic kidney disease with stage 1 through stage 4 chronic kidney disease, or unspecified chronic kidney disease: Secondary | ICD-10-CM | POA: Diagnosis present

## 2023-09-03 DIAGNOSIS — Z8249 Family history of ischemic heart disease and other diseases of the circulatory system: Secondary | ICD-10-CM | POA: Diagnosis not present

## 2023-09-03 DIAGNOSIS — N182 Chronic kidney disease, stage 2 (mild): Secondary | ICD-10-CM | POA: Diagnosis present

## 2023-09-03 LAB — PROTIME-INR
INR: 0.9 (ref 0.8–1.2)
Prothrombin Time: 12.8 seconds (ref 11.4–15.2)

## 2023-09-03 LAB — CBC
HCT: 39.1 % (ref 36.0–46.0)
Hemoglobin: 12.9 g/dL (ref 12.0–15.0)
MCH: 28.9 pg (ref 26.0–34.0)
MCHC: 33 g/dL (ref 30.0–36.0)
MCV: 87.7 fL (ref 80.0–100.0)
Platelets: 290 10*3/uL (ref 150–400)
RBC: 4.46 MIL/uL (ref 3.87–5.11)
RDW: 14.9 % (ref 11.5–15.5)
WBC: 9 10*3/uL (ref 4.0–10.5)
nRBC: 0 % (ref 0.0–0.2)

## 2023-09-03 LAB — BASIC METABOLIC PANEL
Anion gap: 9 (ref 5–15)
BUN: 12 mg/dL (ref 6–20)
CO2: 25 mmol/L (ref 22–32)
Calcium: 8.2 mg/dL — ABNORMAL LOW (ref 8.9–10.3)
Chloride: 100 mmol/L (ref 98–111)
Creatinine, Ser: 0.45 mg/dL (ref 0.44–1.00)
GFR, Estimated: >60 mL/min (ref >60)
Glucose, Bld: 309 mg/dL — ABNORMAL HIGH (ref 70–99)
Potassium: 3.5 mmol/L (ref 3.5–5.1)
Sodium: 134 mmol/L — ABNORMAL LOW (ref 135–145)

## 2023-09-03 LAB — GLUCOSE, CAPILLARY
Glucose-Capillary: 310 mg/dL — ABNORMAL HIGH (ref 70–99)
Glucose-Capillary: 138 mg/dL — ABNORMAL HIGH (ref 70–99)
Glucose-Capillary: 111 mg/dL — ABNORMAL HIGH (ref 70–99)
Glucose-Capillary: 130 mg/dL — ABNORMAL HIGH (ref 70–99)

## 2023-09-03 LAB — APTT: aPTT: 23 seconds — ABNORMAL LOW (ref 24–36)

## 2023-09-03 LAB — BETA-HYDROXYBUTYRIC ACID: Beta-Hydroxybutyric Acid: 0.16 mmol/L (ref 0.05–0.27)

## 2023-09-03 MED ORDER — LACTATED RINGERS IV SOLN: INTRAVENOUS | Status: DC

## 2023-09-03 MED ORDER — INSULIN GLARGINE-YFGN 100 UNIT/ML ~~LOC~~ SOLN
12.0000 [IU] | Freq: Every day | SUBCUTANEOUS | Status: DC
  Administered 2023-09-03: 12 [IU] via SUBCUTANEOUS
  Filled 2023-09-03: qty 0.12

## 2023-09-03 MED ORDER — TAMSULOSIN HCL 0.4 MG PO CAPS
0.4000 mg | ORAL_CAPSULE | Freq: Every day | ORAL | Status: DC
  Administered 2023-09-03 – 2023-09-04 (×2): 0.4 mg via ORAL
  Filled 2023-09-03 (×2): qty 1

## 2023-09-03 MED ORDER — INSULIN GLARGINE-YFGN 100 UNIT/ML ~~LOC~~ SOLN
10.0000 [IU] | Freq: Two times a day (BID) | SUBCUTANEOUS | Status: DC
  Filled 2023-09-03: qty 0.1

## 2023-09-03 MED ORDER — ONDANSETRON HCL 4 MG/2ML IJ SOLN
4.0000 mg | Freq: Once | INTRAMUSCULAR | Status: AC
  Administered 2023-09-03: 4 mg via INTRAVENOUS
  Filled 2023-09-03: qty 2

## 2023-09-03 MED ORDER — INSULIN ASPART 100 UNIT/ML IJ SOLN
5.0000 [IU] | Freq: Three times a day (TID) | INTRAMUSCULAR | Status: DC
  Administered 2023-09-03 – 2023-09-04 (×6): 5 [IU] via SUBCUTANEOUS

## 2023-09-03 MED ORDER — CHLORHEXIDINE GLUCONATE CLOTH 2 % EX PADS
6.0000 | MEDICATED_PAD | Freq: Every day | CUTANEOUS | Status: DC
  Administered 2023-09-04: 6 via TOPICAL

## 2023-09-03 NOTE — Progress Notes (Signed)
PROGRESS NOTE SYNIYA CUI  WUX:[id number] DOB: 1961-07-12 DOA: 09/02/2023 PCP: Everardo All, NP  Brief Narrative/Hospital Course: 62 y.o.f w/ hx use of marijuana,IBS,T1DM w/ new onset of generalized abdominal discomfort//stabbing pain x 48 hrs w/ vomiting. Additionally did not take her insulin after the pain started. In the ED: Vitals stable besides mild tachycardia, hypertensive-labile blood pressure, w/ hyperglycemia mild elevation of anion gap, given insulin and IVF, UA with ketones 5 glucose more than 500, bhob 1.4, cbg 569.  Patient was found to have acute urinary retention DKA/HHS >700 cc in bladder after trying to void, Foley catheter placed. CT abdomen pelvis-no acute findings showed bladder wall thickening with underdistention and/or subacute chronic cystitis Foley catheter hepatomegaly. Patient admitted on IV fluids, unable to tolerate p.o., received droperidol with some improvement in the vomiting.    Subjective: Patient seen and examined this morning she reports she has no nausea vomiting or abdominal pain today and wants to eat   Assessment and Plan: Active Problems:   Urinary retention   Essential hypertension   Leukocytosis   Hyperglycemia  Urine retention: Resolved with Foley catheter.  Urology was consulted in ED. Continue foley. Voiding trail in am.  Nausea vomiting abdominal pain: CT abdomen no acute finding besides urine retention.Symptomatic management<denies nausea vomiting pain today ordered carb controlled diet. Cont ivf ppi  Diabetes mellitus with uncontrolled hyperglycemia Mild DKA with mild anion gap acidosis: PTA on Toujeo 12 limits bedtime, 5 u premeal insulin,metformin.  Remains uncontrolled in 300-continue current Semglee, Premeal insulin and SSI.  HbA1c poorly controlled > 15 recently, indicating noncompliance.  D5 water discontinued Recent Labs  Lab 09/02/23 1039 09/02/23 1226 09/02/23 1756 09/02/23 2124 09/03/23 0741  GLUCAP 533* 457* 113*  186* 310*    Hypertension: Blood pressure fluctuating.  Not on antihypertensive but on midodrine w/ holding parameters PTA.  Monitor  Leukocytosis: No evidence of UTI or pneumonia.?  From #1.resolved  CAD HLD: Continue Crestor, Ranexa aspirin  Multiple ED visits and admissions: 8th admission this year.  Last admission 9/15 for DKA uncontrolled diabetes acute on chronic urine retention.  DVT prophylaxis: enoxaparin (LOVENOX) injection 40 mg Start: 09/03/23 0800 SCDs Start: 09/02/23 1549 Code Status:   Code Status: Full Code Family Communication: plan of care discussed with patient at bedside. Patient status is: Inpatient because of" hyperglycemia and urinary retention Level of care: Med-Surg   Dispo: The patient is from: home lives w/ husband            Anticipated disposition: TBD  Objective: Vitals last 24 hrs: Vitals:   09/02/23 1245 09/02/23 1841 09/02/23 1923 09/03/23 0638  BP: (!) 157/89  123/63 (!) 180/99  Pulse: (!) 110 87 89 89  Resp: 18 15 18 16   Temp: 97.8 F (36.6 C) 98 F (36.7 C) 98.2 F (36.8 C) 98.1 F (36.7 C)  TempSrc: Oral Oral Oral   SpO2: 94% 99% 98% 100%   Weight change:   Physical Examination: General exam: alert awake, older than stated age HEENT:Oral mucosa moist, Ear/Nose WNL grossly Respiratory system: bilaterally CLEAR BS, no use of accessory muscle Cardiovascular system: S1 & S2 +, No JVD. Gastrointestinal system: Abdomen soft,NT,ND, BS+ Nervous System:Alert, awake, moving extremities. Extremities: LE edema NEG,distal peripheral pulses palpable.  Skin: No rashes,no icterus. MSK: Normal muscle bulk,tone, power  Medications reviewed:  Scheduled Meds:  aspirin EC  81 mg Oral Daily   enoxaparin (LOVENOX) injection  40 mg Subcutaneous Q24H   insulin aspart  0-15 Units Subcutaneous TID  WC   insulin aspart  0-5 Units Subcutaneous QHS   insulin aspart  5 Units Subcutaneous TID WC   insulin glargine-yfgn  12 Units Subcutaneous QHS    levothyroxine  137 mcg Oral QAC breakfast   pantoprazole  40 mg Oral BID   ranolazine  500 mg Oral BID   rosuvastatin  10 mg Oral QHS   sodium chloride flush  3 mL Intravenous Q12H   Continuous Infusions:  lactated ringers 75 mL/hr at 09/03/23 0830      Diet Order             Diet Carb Modified Fluid consistency: Thin; Room service appropriate? Yes  Diet effective now                   Intake/Output Summary (Last 24 hours) at 09/03/2023 1119 Last data filed at 09/03/2023 1000 Gross per 24 hour  Intake 1170.31 ml  Output 2000 ml  Net -829.69 ml   Net IO Since Admission: -829.69 mL [09/03/23 1119]  Wt Readings from Last 3 Encounters:  08/23/23 56.5 kg  08/01/23 55.3 kg  06/14/23 52.2 kg     Unresulted Labs (From admission, onward)     Start     Ordered   09/09/23 0500  Creatinine, serum  (enoxaparin (LOVENOX)    CrCl >/= 30 ml/min)  Weekly,   R     Comments: while on enoxaparin therapy    09/02/23 1551          Data Reviewed: I have personally reviewed following labs and imaging studies CBC: Recent Labs  Lab 09/02/23 1044 09/02/23 1917 09/03/23 0404  WBC 12.2* 13.4* 9.0  NEUTROABS 9.8*  --   --   HGB 14.8 13.3 12.9  HCT 43.6 39.9 39.1  MCV 85.8 87.3 87.7  PLT 366 334 290   Basic Metabolic Panel: Recent Labs  Lab 09/02/23 1044 09/02/23 1321 09/02/23 1917 09/03/23 0404  NA 134* 135  --  134*  K 3.5 4.0  --  3.5  CL 92* 97*  --  100  CO2 24 25  --  25  GLUCOSE 569* 449*  --  309*  BUN 25* 20  --  12  CREATININE 0.52 0.56 0.52 0.45  CALCIUM 9.4 8.3*  --  8.2*   Recent Labs  Lab 09/03/23 0404  INR 0.9   Recent Labs  Lab 09/02/23 1039 09/02/23 1226 09/02/23 1756 09/02/23 2124 09/03/23 0741  GLUCAP 533* 457* 113* 186* 310*  No results found for this or any previous visit (from the past 240 hour(s)).  Antimicrobials: Anti-infectives (From admission, onward)    None     Culture/Microbiology    Component Value Date/Time   SDES   08/22/2023 2039    URINE, CLEAN CATCH Performed at [facility], [address], [place], [place] [address]    SPECREQUEST  08/22/2023 2039    NONE Performed at [facility], [address] [place], [place] [address]    CULT (A) 08/22/2023 2039    >=100,000 COLONIES/mL DIPHTHEROIDS(CORYNEBACTERIUM SPECIES) Standardized susceptibility testing for this organism is not available. Performed at [facility], [address], [place], [place] [address]    REPTSTATUS 08/23/2023 FINAL 08/22/2023 2039   Radiology Studies: CT ABDOMEN PELVIS W CONTRAST  Result Date: 09/02/2023 CLINICAL DATA:  Abdominal pain, acute, nonlocalized. Urinary retention. EXAM: CT ABDOMEN AND PELVIS WITH CONTRAST TECHNIQUE: Multidetector CT imaging of the abdomen and pelvis was performed using the  standard protocol following bolus administration of intravenous contrast. RADIATION DOSE REDUCTION: This exam was performed according to the departmental dose-optimization program which includes automated exposure control, adjustment of the mA and/or kV according to patient size and/or use of iterative reconstruction technique. CONTRAST:  OMNIPAQUE IOHEXOL 300 MG/ML  SOLN COMPARISON:  08/22/2023. FINDINGS: Lower chest: No acute findings. Heart is at the upper limits of normal in size. No pericardial or pleural effusion. Distal esophagus is grossly unremarkable. Hepatobiliary: Liver is enlarged, 23.0 cm. Liver and gallbladder are otherwise unremarkable. No biliary ductal dilatation. Pancreas: Negative. Spleen: Negative. Adrenals/Urinary Tract: Adrenal glands and kidneys are unremarkable. Ureters are decompressed. Bladder is thick-walled and decompressed with a Foley catheter in place. Air within the bladder is presumably iatrogenic in etiology. Stomach/Bowel: Stomach, small bowel, appendix and colon are unremarkable. Vascular/Lymphatic: Atherosclerotic calcification of the aorta. No  pathologically enlarged lymph nodes. Reproductive: Hysterectomy.  No adnexal mass. Other: No free fluid. Mesenteries and peritoneum are unremarkable. Small umbilical hernia contains fat. Musculoskeletal: Degenerative changes in the spine. IMPRESSION: 1. No acute findings. 2. Bladder wall thickening may be due to under distension and/or subacute/chronic cystitis. Air within the bladder is presumably due to Foley catheter placement. 3. Hepatomegaly. 4.  Aortic atherosclerosis (ICD10-I70.0). Electronically Signed   By: Leanna Battles M.D.   On: 09/02/2023 15:21    LOS: 0 days  Lanae Boast, MD [facility]  09/03/2023, 11:19 AM

## 2023-09-03 NOTE — Hospital Course (Addendum)
62 y.o.f w/ hx use of marijuana,IBS,T1DM w/ new onset of generalized abdominal discomfort//stabbing pain x 48 hrs w/ vomiting. Additionally did not take her insulin after the pain started. In the ED: Vitals stable besides mild tachycardia, hypertensive-labile blood pressure, w/ hyperglycemia mild elevation of anion gap, given insulin and IVF, UA with ketones 5 glucose more than 500, bhob 1.4, cbg 569.  Patient was found to have acute urinary retention DKA/HHS >700 cc in bladder after trying to void, Foley catheter placed. CT abdomen pelvis-no acute findings showed bladder wall thickening with underdistention and/or subacute chronic cystitis Foley catheter hepatomegaly. Patient admitted on IV fluids, unable to tolerate p.o., received droperidol with some improvement in the vomiting.

## 2023-09-04 DIAGNOSIS — I10 Essential (primary) hypertension: Secondary | ICD-10-CM | POA: Diagnosis not present

## 2023-09-04 DIAGNOSIS — R339 Retention of urine, unspecified: Secondary | ICD-10-CM | POA: Diagnosis not present

## 2023-09-04 DIAGNOSIS — D72829 Elevated white blood cell count, unspecified: Secondary | ICD-10-CM | POA: Diagnosis not present

## 2023-09-04 DIAGNOSIS — R739 Hyperglycemia, unspecified: Secondary | ICD-10-CM

## 2023-09-04 LAB — GLUCOSE, CAPILLARY
Glucose-Capillary: 270 mg/dL — ABNORMAL HIGH (ref 70–99)
Glucose-Capillary: 212 mg/dL — ABNORMAL HIGH (ref 70–99)
Glucose-Capillary: 203 mg/dL — ABNORMAL HIGH (ref 70–99)

## 2023-09-04 MED ORDER — INSULIN GLARGINE-YFGN 100 UNIT/ML ~~LOC~~ SOLN
10.0000 [IU] | Freq: Two times a day (BID) | SUBCUTANEOUS | Status: DC
  Administered 2023-09-04: 10 [IU] via SUBCUTANEOUS
  Filled 2023-09-04 (×2): qty 0.1

## 2023-09-04 MED ORDER — TAMSULOSIN HCL 0.4 MG PO CAPS: 0.4000 mg | ORAL_CAPSULE | Freq: Every day | ORAL | 1 refills | Status: DC

## 2023-09-04 NOTE — Progress Notes (Signed)
Mobility Specialist - Progress Note   09/04/23 1357  Mobility  Activity Ambulated independently in hallway  Level of Assistance Independent  Assistive Device None  Distance Ambulated (ft) 500 ft  Activity Response Tolerated well  Mobility Referral Yes  $Mobility charge 1 Mobility  Mobility Specialist Start Time (ACUTE ONLY) 0147  Mobility Specialist Stop Time (ACUTE ONLY) 0154  Mobility Specialist Time Calculation (min) (ACUTE ONLY) 7 min   Pt received in bathroom and agreeable to mobility. No complaints during session. Pt to bed after session with all needs met.    [facility]

## 2023-09-04 NOTE — Progress Notes (Signed)
Reviewed written discharge instructions with patient. All questions answered. Patient verbalized understanding. Walked patient down to main entrance with all belongings... in stable condition.

## 2023-09-04 NOTE — Discharge Summary (Signed)
Physician Discharge Summary   Patient: [name] [name] MRN: [id number] DOB: Feb 16, 1961  Admit date:     09/02/2023  Discharge date: 09/04/23  Discharge Physician: Arnetha Courser   PCP: Everardo All, NP   Recommendations at discharge:  Please obtain CBC and BMP on follow-up Please make sure that her blood glucose levels are good controlled If she developed more urinary retention, she need to follow-up with his urologist. Follow-up with primary care provider within a week  Discharge Diagnoses: Active Problems:   Urinary retention   Essential hypertension   Leukocytosis   Hyperglycemia   Hospital Course: 62 y.o.f w/ hx use of marijuana,IBS,T1DM w/ new onset of generalized abdominal discomfort//stabbing pain x 48 hrs w/ vomiting. Additionally did not take her insulin after the pain started.  In the ED: Vitals stable besides mild tachycardia, hypertensive-labile blood pressure, w/ hyperglycemia mild elevation of anion gap, given insulin and IVF, UA with ketones 5 glucose more than 500, bhob 1.4, cbg 569.  Patient was found to have acute urinary retention DKA/HHS >700 cc in bladder after trying to void, Foley catheter placed. CT abdomen pelvis-no acute findings showed bladder wall thickening with underdistention and/or subacute chronic cystitis Foley catheter hepatomegaly.  Patient admitted on IV fluids, unable to tolerate p.o., received droperidol with some improvement in the vomiting.  9/25: Vital stable, CBG remained elevated, increasing Semglee to 10 units twice daily.  Foley catheter was removed and patient was able to void.  She will continue on Flomax.  If developed another urinary retention she will need to see a urologist, will be high risk for neurogenic bladder due to diabetes mellitus. No more vomiting and able to tolerate diet well.  Patient will continue on current medications and need to have a close follow-up with her providers for further  recommendations.      Assessment and Plan: Urinary retention Has previously had urinary retention with catheterization and at this time is pending an outpatient urology evaluation.  During evaluation in the ER, patient was noted to have about 750 cc of urine in the bladder that she could not void, this may have been the reason for the patient's lower abdominal pain.  At this time pain has resolved, Foley has been placed.  Urology reviewed the case over the phone with the ER attending and per report they plan to still see the patient as an outpatient.  I think at this time it would be best to leave the Foley inside    Essential hypertension Patient does not seem to be on any chronic medications for this on the contrary she is on midodrine.  At this time I suspect patient's high blood pressure here in the ER is due to her acute illness, will control with as needed agents and start oral agents only if persistent hypertension.  Hold midodrine at this time, resume same if hypotension/orthostatic hypotension develops.  Hyperglycemia Largely resolved. Suspect this is due to patient not taking her insulin, her anion gap was minimally elevated, patient does not appear toxic.  At this time we will restart patient's sliding scale insulin as well as her long-acting insulin and monitor.  Patient has been ordered for BMP every 4 hourly, if this is trending in the right direction I anticipate stopping further BMPs. Patient had mild DKA at presentation. Change fludis to dextrose containging fluids. Restart diet in AM, at this time, patient dos not want diet.  Leukocytosis This seems to be chronic, I do not see the clinical  focus of infection.  Patient's bladder wall thickening also appears to be chronic.  Patient does not have leukocytes in the urine that are significant.  We will continue with clinical monitoring   Consultants: None Procedures performed: Foley placement and then removed Disposition:  Home Diet recommendation:  Discharge Diet Orders (From admission, onward)     Start     Ordered   09/04/23 0000  Diet - low sodium heart healthy        09/04/23 1506           Cardiac and Carb modified diet DISCHARGE MEDICATION: Allergies as of 09/04/2023       Reactions   Codeine Hives, Anxiety, Other (See Comments)        Medication List     TAKE these medications    aspirin EC 81 MG tablet Take 81 mg by mouth daily. Swallow whole.   Dexcom G6 Sensor Misc INJECT 1 SENSOR INTO SKIN EVERY 10 DAYS What changed: See the new instructions.   Dexcom G6 Transmitter Misc USE AS DIRECTED FOR CONTINUOUS GLUCOSE MONITORING. REUSE TRANSMITTER X 90DAYS THEN DISCARD & REPLACE   Glucagon Emergency 1 MG Kit Inject 1 mg into the skin See admin instructions. Inject one mg into the skin see administration instructions. Follow package directions for low blood sugar.   levothyroxine 137 MCG tablet Commonly known as: SYNTHROID Take 137 mcg by mouth daily before breakfast.   metoCLOPramide 5 MG tablet Commonly known as: REGLAN Take 1 tablet (5 mg total) by mouth 3 (three) times daily before meals.   midodrine 5 MG tablet Commonly known as: PROAMATINE Take 1 tablet (5 mg total) by mouth 2 (two) times daily with a meal. Hold if your systolic blood pressure  ( top number) is > , please check your blood pressure at home once a day and bring in record for your doctor to review, follow up with your pcp and cardiology regarding your blood pressure medication What changed:  when to take this additional instructions   NovoLOG FlexPen 100 UNIT/ML FlexPen Generic drug: insulin aspart Inject 4 Units into the skin See admin instructions. Inject 4 units into the skin three times a day after meals, PLUS sliding scale coverage   pantoprazole 40 MG tablet Commonly known as: Protonix Take 1 tablet (40 mg total) by mouth 2 (two) times daily.   ranolazine 500 MG 12 hr tablet Commonly  known as: RANEXA Take 500 mg by mouth 2 (two) times daily.   rosuvastatin 10 MG tablet Commonly known as: CRESTOR Take 10 mg by mouth at bedtime.   tamsulosin 0.4 MG Caps capsule Commonly known as: FLOMAX Take 1 capsule (0.4 mg total) by mouth daily after supper.   Toujeo SoloStar 300 UNIT/ML Solostar Pen Generic drug: insulin glargine (1 Unit Dial) Inject 12 Units into the skin at bedtime.        Follow-up Information     Everardo All, NP. Schedule an appointment as soon as possible for a visit in 1 week(s).   Specialty: Family Medicine               Discharge Exam: There were no vitals filed for this visit. General.  Well-developed lady, in no acute distress. Pulmonary.  Lungs clear bilaterally, normal respiratory effort. CV.  Regular rate and rhythm, no JVD, rub or murmur. Abdomen.  Soft, nontender, nondistended, BS positive. CNS.  Alert and oriented .  No focal neurologic deficit. Extremities.  No edema, no cyanosis, pulses intact  and symmetrical. Psychiatry.  Judgment and insight appears normal.   Condition at discharge: stable  The results of significant diagnostics from this hospitalization (including imaging, microbiology, ancillary and laboratory) are listed below for reference.   Imaging Studies: CT ABDOMEN PELVIS W CONTRAST  Result Date: 09/02/2023 CLINICAL DATA:  Abdominal pain, acute, nonlocalized. Urinary retention. EXAM: CT ABDOMEN AND PELVIS WITH CONTRAST TECHNIQUE: Multidetector CT imaging of the abdomen and pelvis was performed using the standard protocol following bolus administration of intravenous contrast. RADIATION DOSE REDUCTION: This exam was performed according to the departmental dose-optimization program which includes automated exposure control, adjustment of the mA and/or kV according to patient size and/or use of iterative reconstruction technique. CONTRAST:  OMNIPAQUE IOHEXOL 300 MG/ML  SOLN COMPARISON:  08/22/2023. FINDINGS:  Lower chest: No acute findings. Heart is at the upper limits of normal in size. No pericardial or pleural effusion. Distal esophagus is grossly unremarkable. Hepatobiliary: Liver is enlarged, 23.0 cm. Liver and gallbladder are otherwise unremarkable. No biliary ductal dilatation. Pancreas: Negative. Spleen: Negative. Adrenals/Urinary Tract: Adrenal glands and kidneys are unremarkable. Ureters are decompressed. Bladder is thick-walled and decompressed with a Foley catheter in place. Air within the bladder is presumably iatrogenic in etiology. Stomach/Bowel: Stomach, small bowel, appendix and colon are unremarkable. Vascular/Lymphatic: Atherosclerotic calcification of the aorta. No pathologically enlarged lymph nodes. Reproductive: Hysterectomy.  No adnexal mass. Other: No free fluid. Mesenteries and peritoneum are unremarkable. Small umbilical hernia contains fat. Musculoskeletal: Degenerative changes in the spine. IMPRESSION: 1. No acute findings. 2. Bladder wall thickening may be due to under distension and/or subacute/chronic cystitis. Air within the bladder is presumably due to Foley catheter placement. 3. Hepatomegaly. 4.  Aortic atherosclerosis (ICD10-I70.0). Electronically Signed   By: Leanna Battles M.D.   On: 09/02/2023 15:21   CT ABDOMEN PELVIS W CONTRAST  Result Date: 08/22/2023 CLINICAL DATA:  Abdomen pain nausea vomiting EXAM: CT ABDOMEN AND PELVIS WITH CONTRAST TECHNIQUE: Multidetector CT imaging of the abdomen and pelvis was performed using the standard protocol following bolus administration of intravenous contrast. RADIATION DOSE REDUCTION: This exam was performed according to the departmental dose-optimization program which includes automated exposure control, adjustment of the mA and/or kV according to patient size and/or use of iterative reconstruction technique. CONTRAST:  OMNIPAQUE IOHEXOL 300 MG/ML  SOLN COMPARISON:  CT 08/01/2023, 06/14/2023, 05/14/2023, and prior exams dating back  to 06/03/2017 FINDINGS: Lower chest: Lung bases demonstrate no acute airspace disease. Hepatobiliary: Hepatic steatosis. No calcified gallstone. Stable thickening at the gallbladder fundus compared with multiple priors and likely due to adenomyomatosis. No biliary dilatation Pancreas: Unremarkable. No pancreatic ductal dilatation or surrounding inflammatory changes. Spleen: Normal in size without focal abnormality. Adrenals/Urinary Tract: Adrenal glands are unremarkable. Kidneys are normal, without renal calculi, focal lesion, or hydronephrosis. The bladder is diffusely thick walled in appearance. Stomach/Bowel: Stomach is nonenlarged. There is no dilated small bowel. Colon diverticular disease. Collapsed appearance versus mild colitis type changes of the distal descending and sigmoid colon. Negative appendix Vascular/Lymphatic: Mild aortic atherosclerosis. No aneurysm. No suspicious lymph nodes. Reproductive: Status post hysterectomy. No adnexal masses. Other: Negative for pelvic effusion or free air. Small fat containing umbilical hernia Musculoskeletal: No acute or suspicious osseous abnormality. IMPRESSION: 1. Collapsed appearance versus mild colitis type changes of the distal descending and sigmoid colon. 2. Diffusely thick walled appearance of the bladder, cystitis versus changes related to chronic obstruction. 3. Hepatic steatosis. 4. Aortic atherosclerosis. Aortic Atherosclerosis (ICD10-I70.0). Electronically Signed   By: Adrian Prows.D.  On: 08/22/2023 17:15    Microbiology: Results for orders placed or performed during the hospital encounter of 08/22/23  Culture, blood (single)     Status: None   Collection Time: 08/22/23  5:22 PM   Specimen: BLOOD  Result Value Ref Range Status   Specimen Description   Final    BLOOD SITE NOT SPECIFIED Performed at [facility], [address] [place], [place] [address]    Special Requests   Final    BOTTLES DRAWN AEROBIC AND ANAEROBIC  Blood Culture results may not be optimal due to an excessive volume of blood received in culture bottles Performed at [facility], [address] [place], [place] [address]    Culture   Final    NO GROWTH 5 DAYS Performed at [facility], [address] [place], [place] [address]    Report Status 08/27/2023 FINAL  Final  Urine Culture (for pregnant, neutropenic or urologic patients or patients with an indwelling urinary catheter)     Status: Abnormal   Collection Time: 08/22/23  8:39 PM   Specimen: Urine, Clean Catch  Result Value Ref Range Status   Specimen Description   Final    URINE, CLEAN CATCH Performed at [facility], [address], [place], [place] [address]    Special Requests   Final    NONE Performed at [facility], [address], [place], [place] [address]    Culture (A)  Final    >=100,000 COLONIES/mL DIPHTHEROIDS(CORYNEBACTERIUM SPECIES) Standardized susceptibility testing for this organism is not available. Performed at [facility], [address], [place], [place] [address]    Report Status 08/23/2023 FINAL  Final  MRSA Next Gen by PCR, Nasal     Status: None   Collection Time: 08/23/23  2:19 AM   Specimen: Nasal Mucosa; Nasal Swab  Result Value Ref Range Status   MRSA by PCR Next Gen NOT DETECTED NOT DETECTED Final    Comment: (NOTE) The GeneXpert MRSA Assay (FDA approved for NASAL specimens only), is one component of a comprehensive MRSA colonization surveillance program. It is not intended to diagnose MRSA infection nor to guide or monitor treatment for MRSA infections. Test performance is not FDA approved in patients less than 41 years old. Performed at [facility], [address] [place], [place] [address]     Labs: CBC: Recent Labs  Lab 09/02/23 1044 09/02/23 1917 09/03/23 0404  WBC 12.2* 13.4* 9.0  NEUTROABS 9.8*  --   --   HGB 14.8 13.3 12.9   HCT 43.6 39.9 39.1  MCV 85.8 87.3 87.7  PLT 366 334 290   Basic Metabolic Panel: Recent Labs  Lab 09/02/23 1044 09/02/23 1321 09/02/23 1917 09/03/23 0404  NA 134* 135  --  134*  K 3.5 4.0  --  3.5  CL 92* 97*  --  100  CO2 24 25  --  25  GLUCOSE 569* 449*  --  309*  BUN 25* 20  --  12  CREATININE 0.52 0.56 0.52 0.45  CALCIUM 9.4 8.3*  --  8.2*   Liver Function Tests: No results for input(s): "AST", "ALT", "ALKPHOS", "BILITOT", "PROT", "ALBUMIN" in the last 168 hours. CBG: Recent Labs  Lab 09/03/23 1144 09/03/23 1613 09/03/23 2157 09/04/23 0713 09/04/23 1128  GLUCAP 138* 111* 130* 270* 212*    Discharge time spent: greater than 30 minutes.  This record has been created using [employer]  voice recognition software. Errors have been sought and corrected,but may not always be located. Such creation errors do not reflect on the standard of care.   Signed: Arnetha Courser, MD [facility] 09/04/2023

## 2023-09-04 NOTE — Plan of Care (Signed)
Problem: Education: Goal: Ability to describe self-care measures that may prevent or decrease complications (Diabetes Survival Skills Education) will improve Outcome: Adequate for Discharge Goal: Individualized Educational Video(s) Outcome: Adequate for Discharge   Problem: Cardiac: Goal: Ability to maintain an adequate cardiac output will improve Outcome: Adequate for Discharge   Problem: Health Behavior/Discharge Planning: Goal: Ability to identify and utilize available resources and services will improve Outcome: Adequate for Discharge Goal: Ability to manage health-related needs will improve Outcome: Adequate for Discharge   Problem: Fluid Volume: Goal: Ability to achieve a balanced intake and output will improve Outcome: Adequate for Discharge   Problem: Metabolic: Goal: Ability to maintain appropriate glucose levels will improve Outcome: Adequate for Discharge   Problem: Nutritional: Goal: Maintenance of adequate nutrition will improve Outcome: Adequate for Discharge Goal: Maintenance of adequate weight for body size and type will improve Outcome: Adequate for Discharge   Problem: Respiratory: Goal: Will regain and/or maintain adequate ventilation Outcome: Adequate for Discharge   Problem: Urinary Elimination: Goal: Ability to achieve and maintain adequate renal perfusion and functioning will improve Outcome: Adequate for Discharge   Problem: Education: Goal: Ability to describe self-care measures that may prevent or decrease complications (Diabetes Survival Skills Education) will improve Outcome: Adequate for Discharge Goal: Individualized Educational Video(s) Outcome: Adequate for Discharge   Problem: Coping: Goal: Ability to adjust to condition or change in health will improve Outcome: Adequate for Discharge   Problem: Fluid Volume: Goal: Ability to maintain a balanced intake and output will improve Outcome: Adequate for Discharge   Problem: Health  Behavior/Discharge Planning: Goal: Ability to identify and utilize available resources and services will improve Outcome: Adequate for Discharge Goal: Ability to manage health-related needs will improve Outcome: Adequate for Discharge   Problem: Metabolic: Goal: Ability to maintain appropriate glucose levels will improve Outcome: Adequate for Discharge   Problem: Nutritional: Goal: Maintenance of adequate nutrition will improve Outcome: Adequate for Discharge Goal: Progress toward achieving an optimal weight will improve Outcome: Adequate for Discharge   Problem: Skin Integrity: Goal: Risk for impaired skin integrity will decrease Outcome: Adequate for Discharge   Problem: Tissue Perfusion: Goal: Adequacy of tissue perfusion will improve Outcome: Adequate for Discharge   Problem: Education: Goal: Knowledge of General Education information will improve Description: Including pain rating scale, medication(s)/side effects and non-pharmacologic comfort measures Outcome: Adequate for Discharge   Problem: Health Behavior/Discharge Planning: Goal: Ability to manage health-related needs will improve Outcome: Adequate for Discharge   Problem: Clinical Measurements: Goal: Ability to maintain clinical measurements within normal limits will improve Outcome: Adequate for Discharge Goal: Will remain free from infection Outcome: Adequate for Discharge Goal: Diagnostic test results will improve Outcome: Adequate for Discharge Goal: Respiratory complications will improve Outcome: Adequate for Discharge Goal: Cardiovascular complication will be avoided Outcome: Adequate for Discharge   Problem: Activity: Goal: Risk for activity intolerance will decrease Outcome: Adequate for Discharge   Problem: Nutrition: Goal: Adequate nutrition will be maintained Outcome: Adequate for Discharge   Problem: Coping: Goal: Level of anxiety will decrease Outcome: Adequate for Discharge   Problem:  Elimination: Goal: Will not experience complications related to bowel motility Outcome: Adequate for Discharge Goal: Will not experience complications related to urinary retention Outcome: Adequate for Discharge   Problem: Pain Managment: Goal: General experience of comfort will improve Outcome: Adequate for Discharge   Problem: Safety: Goal: Ability to remain free from injury will improve Outcome: Adequate for Discharge   Problem: Skin Integrity: Goal: Risk for impaired skin integrity will decrease  Outcome: Adequate for Discharge

## 2023-09-05 NOTE — Consult Note (Signed)
[facility] Inpatient Consult   09/05/2023  [name] 1961/12/06 [id number]  [facility] [[facility]]  Accountable Care Organization [ACO] Patient: [name] [name] [facility]  Primary Care Provider: Everardo All, NP, is showing as an uncontributed provider and not in [facility].    RN Hospital Liaison screened the patient remotely at [facility].  Reason for screen:  The patient was screened for 30 day readmission hospitalization with noted extreme risk score for unplanned readmission risk 6 hospital admissions in 6 months. Reviewed brief electronic medical record follow up per inpatient [facility] RN notes.  Plan: Patient does not have a provider in [employer]  Will sign off      For questions contact:   Charlesetta Shanks, RN, BSN, CCM [place]  [facility] Liaison Direct Dial: [phone] or secure chat Website: [email or web address]

## 2023-09-09 DIAGNOSIS — F319 Bipolar disorder, unspecified: Secondary | ICD-10-CM | POA: Diagnosis not present

## 2023-09-12 ENCOUNTER — Inpatient Hospital Stay (HOSPITAL_COMMUNITY)
Admission: EM | Admit: 2023-09-12 | Discharge: 2023-09-14 | DRG: 440 | Disposition: A | Discharge status: Home / Self Care | Payer: BC Managed Care – PPO | Attending: Internal Medicine | Admitting: Internal Medicine

## 2023-09-12 ENCOUNTER — Other Ambulatory Visit: Payer: Self-pay

## 2023-09-12 ENCOUNTER — Encounter (HOSPITAL_COMMUNITY): Payer: Self-pay

## 2023-09-12 ENCOUNTER — Observation Stay (HOSPITAL_COMMUNITY): Payer: BC Managed Care – PPO

## 2023-09-12 DIAGNOSIS — E109 Type 1 diabetes mellitus without complications: Secondary | ICD-10-CM | POA: Diagnosis present

## 2023-09-12 DIAGNOSIS — N182 Chronic kidney disease, stage 2 (mild): Secondary | ICD-10-CM | POA: Diagnosis not present

## 2023-09-12 DIAGNOSIS — Z8249 Family history of ischemic heart disease and other diseases of the circulatory system: Secondary | ICD-10-CM | POA: Diagnosis not present

## 2023-09-12 DIAGNOSIS — Z7982 Long term (current) use of aspirin: Secondary | ICD-10-CM | POA: Diagnosis not present

## 2023-09-12 DIAGNOSIS — I10 Essential (primary) hypertension: Secondary | ICD-10-CM | POA: Diagnosis not present

## 2023-09-12 DIAGNOSIS — Z87891 Personal history of nicotine dependence: Secondary | ICD-10-CM

## 2023-09-12 DIAGNOSIS — I129 Hypertensive chronic kidney disease with stage 1 through stage 4 chronic kidney disease, or unspecified chronic kidney disease: Secondary | ICD-10-CM | POA: Diagnosis not present

## 2023-09-12 DIAGNOSIS — R1084 Generalized abdominal pain: Secondary | ICD-10-CM | POA: Diagnosis not present

## 2023-09-12 DIAGNOSIS — K589 Irritable bowel syndrome without diarrhea: Secondary | ICD-10-CM | POA: Diagnosis present

## 2023-09-12 DIAGNOSIS — Z79890 Hormone replacement therapy (postmenopausal): Secondary | ICD-10-CM | POA: Diagnosis not present

## 2023-09-12 DIAGNOSIS — E1065 Type 1 diabetes mellitus with hyperglycemia: Secondary | ICD-10-CM | POA: Diagnosis not present

## 2023-09-12 DIAGNOSIS — K859 Acute pancreatitis without necrosis or infection, unspecified: Secondary | ICD-10-CM | POA: Diagnosis not present

## 2023-09-12 DIAGNOSIS — Z833 Family history of diabetes mellitus: Secondary | ICD-10-CM

## 2023-09-12 DIAGNOSIS — Z820 Family history of epilepsy and other diseases of the nervous system: Secondary | ICD-10-CM

## 2023-09-12 DIAGNOSIS — R000 Tachycardia, unspecified: Secondary | ICD-10-CM | POA: Diagnosis not present

## 2023-09-12 DIAGNOSIS — E1042 Type 1 diabetes mellitus with diabetic polyneuropathy: Secondary | ICD-10-CM | POA: Diagnosis present

## 2023-09-12 DIAGNOSIS — M810 Age-related osteoporosis without current pathological fracture: Secondary | ICD-10-CM | POA: Diagnosis present

## 2023-09-12 DIAGNOSIS — K219 Gastro-esophageal reflux disease without esophagitis: Secondary | ICD-10-CM | POA: Diagnosis present

## 2023-09-12 DIAGNOSIS — Z794 Long term (current) use of insulin: Secondary | ICD-10-CM | POA: Diagnosis not present

## 2023-09-12 DIAGNOSIS — E785 Hyperlipidemia, unspecified: Secondary | ICD-10-CM | POA: Diagnosis present

## 2023-09-12 DIAGNOSIS — R739 Hyperglycemia, unspecified: Secondary | ICD-10-CM | POA: Diagnosis not present

## 2023-09-12 DIAGNOSIS — E1022 Type 1 diabetes mellitus with diabetic chronic kidney disease: Secondary | ICD-10-CM | POA: Diagnosis not present

## 2023-09-12 DIAGNOSIS — Z885 Allergy status to narcotic agent status: Secondary | ICD-10-CM

## 2023-09-12 DIAGNOSIS — Z79899 Other long term (current) drug therapy: Secondary | ICD-10-CM | POA: Diagnosis not present

## 2023-09-12 DIAGNOSIS — E039 Hypothyroidism, unspecified: Secondary | ICD-10-CM | POA: Diagnosis present

## 2023-09-12 LAB — CBC WITH DIFFERENTIAL/PLATELET
Abs Immature Granulocytes: 0.08 10*3/uL — ABNORMAL HIGH (ref 0.00–0.07)
Basophils Absolute: 0 10*3/uL (ref 0.0–0.1)
Basophils Relative: 0 %
Eosinophils Absolute: 0 10*3/uL (ref 0.0–0.5)
Eosinophils Relative: 0 %
HCT: 41.2 % (ref 36.0–46.0)
Hemoglobin: 13.8 g/dL (ref 12.0–15.0)
Immature Granulocytes: 1 %
Lymphocytes Relative: 17 %
Lymphs Abs: 2 10*3/uL (ref 0.7–4.0)
MCH: 28.8 pg (ref 26.0–34.0)
MCHC: 33.5 g/dL (ref 30.0–36.0)
MCV: 85.8 fL (ref 80.0–100.0)
Monocytes Absolute: 1 10*3/uL (ref 0.1–1.0)
Monocytes Relative: 9 %
Neutro Abs: 8.8 10*3/uL — ABNORMAL HIGH (ref 1.7–7.7)
Neutrophils Relative %: 73 %
Platelets: 454 10*3/uL — ABNORMAL HIGH (ref 150–400)
RBC: 4.8 MIL/uL (ref 3.87–5.11)
RDW: 14.5 % (ref 11.5–15.5)
WBC: 12 10*3/uL — ABNORMAL HIGH (ref 4.0–10.5)
nRBC: 0 % (ref 0.0–0.2)

## 2023-09-12 LAB — CBC
HCT: 38.3 % (ref 36.0–46.0)
Hemoglobin: 12.9 g/dL (ref 12.0–15.0)
MCH: 29.5 pg (ref 26.0–34.0)
MCHC: 33.7 g/dL (ref 30.0–36.0)
MCV: 87.4 fL (ref 80.0–100.0)
Platelets: 426 10*3/uL — ABNORMAL HIGH (ref 150–400)
RBC: 4.38 MIL/uL (ref 3.87–5.11)
RDW: 14.5 % (ref 11.5–15.5)
WBC: 15.9 10*3/uL — ABNORMAL HIGH (ref 4.0–10.5)
nRBC: 0 % (ref 0.0–0.2)

## 2023-09-12 LAB — GLUCOSE, CAPILLARY
Glucose-Capillary: 290 mg/dL — ABNORMAL HIGH (ref 70–99)
Glucose-Capillary: 115 mg/dL — ABNORMAL HIGH (ref 70–99)

## 2023-09-12 LAB — COMPREHENSIVE METABOLIC PANEL
ALT: 18 U/L (ref 0–44)
ALT: 16 U/L (ref 0–44)
AST: 17 U/L (ref 15–41)
AST: 22 U/L (ref 15–41)
Albumin: 4.5 g/dL (ref 3.5–5.0)
Albumin: 3.8 g/dL (ref 3.5–5.0)
Alkaline Phosphatase: 84 U/L (ref 38–126)
Alkaline Phosphatase: 68 U/L (ref 38–126)
Anion gap: 19 — ABNORMAL HIGH (ref 5–15)
Anion gap: 12 (ref 5–15)
BUN: 21 mg/dL — ABNORMAL HIGH (ref 6–20)
BUN: 18 mg/dL (ref 6–20)
CO2: 28 mmol/L (ref 22–32)
CO2: 23 mmol/L (ref 22–32)
Calcium: 9.1 mg/dL (ref 8.9–10.3)
Calcium: 8.3 mg/dL — ABNORMAL LOW (ref 8.9–10.3)
Chloride: 85 mmol/L — ABNORMAL LOW (ref 98–111)
Chloride: 95 mmol/L — ABNORMAL LOW (ref 98–111)
Creatinine, Ser: 0.78 mg/dL (ref 0.44–1.00)
Creatinine, Ser: 0.6 mg/dL (ref 0.44–1.00)
GFR, Estimated: >60 mL/min (ref >60)
GFR, Estimated: >60 mL/min (ref >60)
Glucose, Bld: 410 mg/dL — ABNORMAL HIGH (ref 70–99)
Glucose, Bld: 281 mg/dL — ABNORMAL HIGH (ref 70–99)
Potassium: 3.5 mmol/L (ref 3.5–5.1)
Potassium: 4.4 mmol/L (ref 3.5–5.1)
Sodium: 132 mmol/L — ABNORMAL LOW (ref 135–145)
Sodium: 130 mmol/L — ABNORMAL LOW (ref 135–145)
Total Bilirubin: 0.9 mg/dL (ref 0.3–1.2)
Total Bilirubin: 1.5 mg/dL — ABNORMAL HIGH (ref 0.3–1.2)
Total Protein: 8.5 g/dL — ABNORMAL HIGH (ref 6.5–8.1)
Total Protein: 7.5 g/dL (ref 6.5–8.1)

## 2023-09-12 LAB — CBG MONITORING, ED
Glucose-Capillary: 410 mg/dL — ABNORMAL HIGH (ref 70–99)
Glucose-Capillary: 250 mg/dL — ABNORMAL HIGH (ref 70–99)

## 2023-09-12 LAB — URINALYSIS, ROUTINE W REFLEX MICROSCOPIC
Bacteria, UA: NONE SEEN
Bilirubin Urine: NEGATIVE
Glucose, UA: >=500 mg/dL — AB
Hgb urine dipstick: NEGATIVE
Ketones, ur: 20 mg/dL — AB
Leukocytes,Ua: NEGATIVE
Nitrite: NEGATIVE
Protein, ur: 30 mg/dL — AB
Specific Gravity, Urine: 1.021 (ref 1.005–1.030)
pH: 7 (ref 5.0–8.0)

## 2023-09-12 LAB — BLOOD GAS, VENOUS
Acid-Base Excess: 10 mmol/L — ABNORMAL HIGH (ref 0.0–2.0)
Bicarbonate: 35 mmol/L — ABNORMAL HIGH (ref 20.0–28.0)
O2 Saturation: 58 %
Patient temperature: 37
pCO2, Ven: 47 mm[Hg] (ref 44–60)
pH, Ven: 7.48 — ABNORMAL HIGH (ref 7.25–7.43)
pO2, Ven: 32 mm[Hg] (ref 32–45)

## 2023-09-12 LAB — LIPASE, BLOOD
Lipase: 390 U/L — ABNORMAL HIGH (ref 11–51)
Lipase: 188 U/L — ABNORMAL HIGH (ref 11–51)

## 2023-09-12 LAB — BETA-HYDROXYBUTYRIC ACID: Beta-Hydroxybutyric Acid: 1.94 mmol/L — ABNORMAL HIGH (ref 0.05–0.27)

## 2023-09-12 LAB — MAGNESIUM: Magnesium: 2.2 mg/dL (ref 1.7–2.4)

## 2023-09-12 MED ORDER — ONDANSETRON HCL 4 MG/2ML IJ SOLN
4.0000 mg | Freq: Four times a day (QID) | INTRAMUSCULAR | Status: DC | PRN
  Administered 2023-09-12: 4 mg via INTRAVENOUS
  Filled 2023-09-12 (×2): qty 2

## 2023-09-12 MED ORDER — ASPIRIN 81 MG PO TBEC
81.0000 mg | DELAYED_RELEASE_TABLET | Freq: Every day | ORAL | Status: DC
  Administered 2023-09-12 – 2023-09-14 (×3): 81 mg via ORAL
  Filled 2023-09-12 (×3): qty 1

## 2023-09-12 MED ORDER — ACETAMINOPHEN 325 MG PO TABS
650.0000 mg | ORAL_TABLET | Freq: Four times a day (QID) | ORAL | Status: DC | PRN
  Administered 2023-09-13: 650 mg via ORAL
  Filled 2023-09-12 (×2): qty 2

## 2023-09-12 MED ORDER — INSULIN ASPART 100 UNIT/ML IV SOLN
15.0000 [IU] | Freq: Once | INTRAVENOUS | Status: AC
  Administered 2023-09-12: 15 [IU] via INTRAVENOUS
  Filled 2023-09-12: qty 0.15

## 2023-09-12 MED ORDER — POTASSIUM CHLORIDE 10 MEQ/100ML IV SOLN
10.0000 meq | INTRAVENOUS | Status: AC
  Administered 2023-09-12 (×2): 10 meq via INTRAVENOUS
  Filled 2023-09-12 (×2): qty 100

## 2023-09-12 MED ORDER — TAMSULOSIN HCL 0.4 MG PO CAPS
0.4000 mg | ORAL_CAPSULE | Freq: Every day | ORAL | Status: DC
  Administered 2023-09-12 – 2023-09-13 (×2): 0.4 mg via ORAL
  Filled 2023-09-12 (×2): qty 1

## 2023-09-12 MED ORDER — LACTATED RINGERS IV BOLUS
2000.0000 mL | Freq: Once | INTRAVENOUS | Status: AC
  Administered 2023-09-12: 2000 mL via INTRAVENOUS

## 2023-09-12 MED ORDER — LEVOTHYROXINE SODIUM 25 MCG PO TABS
137.0000 ug | ORAL_TABLET | Freq: Every day | ORAL | Status: DC
  Administered 2023-09-12 – 2023-09-14 (×3): 137 ug via ORAL
  Filled 2023-09-12 (×3): qty 1

## 2023-09-12 MED ORDER — INSULIN ASPART 100 UNIT/ML IJ SOLN
0.0000 [IU] | Freq: Three times a day (TID) | INTRAMUSCULAR | Status: DC
  Administered 2023-09-12: 5 [IU] via SUBCUTANEOUS
  Administered 2023-09-13: 1 [IU] via SUBCUTANEOUS
  Administered 2023-09-13 (×2): 2 [IU] via SUBCUTANEOUS
  Administered 2023-09-14: 3 [IU] via SUBCUTANEOUS
  Administered 2023-09-14: 2 [IU] via SUBCUTANEOUS

## 2023-09-12 MED ORDER — ONDANSETRON HCL 4 MG PO TABS: 4.0000 mg | ORAL_TABLET | Freq: Four times a day (QID) | ORAL | Status: DC | PRN

## 2023-09-12 MED ORDER — POLYETHYLENE GLYCOL 3350 17 G PO PACK
17.0000 g | PACK | Freq: Every day | ORAL | Status: DC | PRN
  Administered 2023-09-14: 17 g via ORAL

## 2023-09-12 MED ORDER — SODIUM CHLORIDE 0.9% FLUSH
3.0000 mL | Freq: Two times a day (BID) | INTRAVENOUS | Status: DC
  Administered 2023-09-13 – 2023-09-14 (×2): 3 mL via INTRAVENOUS

## 2023-09-12 MED ORDER — DROPERIDOL 2.5 MG/ML IJ SOLN
1.2500 mg | Freq: Once | INTRAMUSCULAR | Status: AC
  Administered 2023-09-12: 1.25 mg via INTRAVENOUS
  Filled 2023-09-12: qty 2

## 2023-09-12 MED ORDER — ROSUVASTATIN CALCIUM 10 MG PO TABS
10.0000 mg | ORAL_TABLET | Freq: Every day | ORAL | Status: DC
  Administered 2023-09-12 – 2023-09-13 (×2): 10 mg via ORAL
  Filled 2023-09-12 (×2): qty 1

## 2023-09-12 MED ORDER — ENOXAPARIN SODIUM 40 MG/0.4ML IJ SOSY
40.0000 mg | PREFILLED_SYRINGE | INTRAMUSCULAR | Status: DC
  Administered 2023-09-12 – 2023-09-14 (×3): 40 mg via SUBCUTANEOUS
  Filled 2023-09-12 (×3): qty 0.4

## 2023-09-12 MED ORDER — PANTOPRAZOLE SODIUM 40 MG PO TBEC
40.0000 mg | DELAYED_RELEASE_TABLET | Freq: Two times a day (BID) | ORAL | Status: DC
  Administered 2023-09-12 – 2023-09-14 (×4): 40 mg via ORAL
  Filled 2023-09-12 (×5): qty 1

## 2023-09-12 MED ORDER — SODIUM CHLORIDE 0.9 % IV SOLN: INTRAVENOUS | Status: AC

## 2023-09-12 MED ORDER — SODIUM CHLORIDE 0.9 % IV SOLN
12.5000 mg | Freq: Four times a day (QID) | INTRAVENOUS | Status: DC | PRN
  Administered 2023-09-12 – 2023-09-13 (×2): 12.5 mg via INTRAVENOUS
  Filled 2023-09-12: qty 12.5
  Filled 2023-09-12: qty 0.5
  Filled 2023-09-12: qty 12.5

## 2023-09-12 MED ORDER — RANOLAZINE ER 500 MG PO TB12
500.0000 mg | ORAL_TABLET | Freq: Two times a day (BID) | ORAL | Status: DC
  Administered 2023-09-12 – 2023-09-14 (×4): 500 mg via ORAL
  Filled 2023-09-12 (×6): qty 1

## 2023-09-12 MED ORDER — HYDROMORPHONE HCL 1 MG/ML IJ SOLN
0.5000 mg | INTRAMUSCULAR | Status: DC | PRN
  Administered 2023-09-12 – 2023-09-13 (×7): 0.5 mg via INTRAVENOUS
  Filled 2023-09-12: qty 0.5
  Filled 2023-09-12: qty 1
  Filled 2023-09-12 (×7): qty 0.5

## 2023-09-12 MED ORDER — INSULIN GLARGINE-YFGN 100 UNIT/ML ~~LOC~~ SOLN
6.0000 [IU] | Freq: Every day | SUBCUTANEOUS | Status: DC
  Administered 2023-09-12 – 2023-09-14 (×3): 6 [IU] via SUBCUTANEOUS
  Filled 2023-09-12 (×3): qty 0.06

## 2023-09-12 MED ORDER — ACETAMINOPHEN 650 MG RE SUPP: 650.0000 mg | Freq: Four times a day (QID) | RECTAL | Status: DC | PRN

## 2023-09-12 MED ORDER — DIPHENHYDRAMINE HCL 50 MG/ML IJ SOLN
25.0000 mg | Freq: Once | INTRAMUSCULAR | Status: AC
  Administered 2023-09-12: 25 mg via INTRAVENOUS
  Filled 2023-09-12: qty 1

## 2023-09-12 NOTE — ED Notes (Signed)
ED TO INPATIENT HANDOFF REPORT  Name/Age/Gender [name] [name] 62 y.o. female  Code Status    Code Status Orders  (From admission, onward)           Start     Ordered   09/12/23 0358  Full code  Continuous       Question:  By:  Answer:  Consent: discussion documented in EHR   09/12/23 0359           Code Status History     Date Active Date Inactive Code Status Order ID Comments User Context   09/02/2023 1551 09/04/2023 2316 Full Code [id number]  Nolberto Hanlon, MD ED   08/22/2023 2023 08/25/2023 1740 Full Code [id number]  Charlsie Quest, MD ED   08/01/2023 0903 08/03/2023 0000 Full Code [id number]  Olegario Messier, MD ED   06/14/2023 1522 06/17/2023 1341 Full Code [id number]  Maryln Gottron, MD ED   05/14/2023 1659 05/16/2023 1837 Full Code [id number]  Orland Mustard, MD ED   04/19/2023 1442 04/21/2023 2059 Full Code [id number]  Maryln Gottron, MD ED   02/02/2023 1659 02/06/2023 1828 Full Code [id number]  Alwyn Ren, MD ED   01/06/2023 1921 01/08/2023 1944 Full Code [id number]  Anselm Jungling, DO ED   03/27/2022 1948 03/29/2022 1959 Full Code [id number]  Teddy Spike, DO Inpatient   01/18/2022 1249 01/19/2022 1533 Full Code [id number]  Wynetta Fines, MD ED   08/16/2020 1031 08/17/2020 1553 Full Code [id number]  Jae Dire, MD ED   05/30/2019 0546 06/03/2019 2108 Full Code [id number]  Briscoe Deutscher, MD Inpatient   04/23/2019 1450 04/24/2019 1442 Full Code [id number]  Jonah Blue, MD ED   03/25/2019 1934 03/28/2019 0011 Full Code [id number]  Barnetta Chapel, MD Inpatient   03/25/2019 1934 03/25/2019 1934 Full Code [id number]  Barnetta Chapel, MD Inpatient   01/12/2019 1912 01/14/2019 1642 Full Code [id number]  Alba Cory, MD ED   11/26/2018 1539 11/28/2018 1346 Full Code [id number]  Bobette Mo, MD ED   11/26/2018 1530 11/26/2018 1539 Full Code [id number]  Bobette Mo, MD ED   10/19/2018 1541 10/20/2018 2122 Full Code [id number]  Inez Catalina, MD Inpatient   09/22/2018  1645 09/23/2018 1510 Full Code [id number]  Zigmund Daniel., MD Inpatient   09/22/2018 1645 09/22/2018 1645 Full Code [id number]  Zigmund Daniel., MD Inpatient   09/11/2018 1452 09/12/2018 1621 Full Code [id number]  Jonah Blue, MD ED   08/04/2018 1038 08/05/2018 2012 Full Code [id number]  Gwenyth Bender, NP ED   06/27/2018 1633 06/29/2018 1757 Full Code [id number]  Leatha Gilding, MD ED   05/26/2018 0242 05/28/2018 2108 Full Code [id number]  Eduard Clos, MD ED   03/06/2018 2107 03/08/2018 1752 Full Code [id number]  Briscoe Deutscher, MD ED   02/02/2018 1129 02/04/2018 1854 Full Code [id number]  Haydee Monica, MD ED   12/11/2017 1232 12/14/2017 1658 Full Code [id number]  Marcos Eke, PA-C ED   09/19/2017 0746 09/23/2017 1924 Full Code [id number]  Marcos Eke, PA-C ED   09/17/2017 2203 09/18/2017 1940 Full Code [id number]  Clydie Braun, MD ED   06/22/2017 1709 06/24/2017 1930 Full Code [id number]  Jordan Hawks, DO Inpatient   06/03/2017 1626 06/05/2017 1955 Full Code [id number]  Elwyn Reach ED   04/05/2017 1143 04/08/2017 2117 Full Code [id number]  Gwenyth Bender, NP ED   03/22/2017 2120 03/23/2017 1831 Full  Code [id number]  Alberteen Sam, MD Inpatient   02/09/2017 0958 02/11/2017 2302 Full Code [id number]  Russella Dar, NP ED   07/24/2016 1633 07/26/2016 2113 Full Code [id number]  Ozella Rocks, MD ED   07/24/2016 1633 07/24/2016 1633 Full Code [id number]  Ozella Rocks, MD ED   10/28/2013 2338 10/29/2013 1749 Full Code [id number]  Eddie North, MD ED   09/18/2013 1649 09/20/2013 1637 Full Code [id number]  Hollice Espy, MD Inpatient   09/23/2012 1135 09/25/2012 1753 Full Code [id number]  Pat Patrick, RN Inpatient       Home/SNF/Other Home  Chief Complaint Acute pancreatitis [K85.90]  Level of Care/Admitting Diagnosis ED Disposition     ED Disposition  Admit   Condition  --   Comment  Hospital Area: [facility]  [[id number]]  Level of Care: Telemetry [5]  Admit to tele based on following criteria: Monitor QTC interval  May place patient in observation at [facility] or [facility] if equivalent level of care is available:: Yes  Covid Evaluation: Asymptomatic - no recent exposure (last 10 days) testing not required  Diagnosis: Acute pancreatitis [577.0.ICD-9-CM]  Admitting Physician: Briscoe Deutscher [[id number]]  Attending Physician: Briscoe Deutscher [[id number]]          Medical History Past Medical History:  Diagnosis Date   Allergy    seasonal   Anxiety    B12 deficiency    Chest pain 04/05/2017   CHF (congestive heart failure) ([name])    Diabetes mellitus type 1, uncontrolled    "dr. 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) 10/29/2013   Leg cramping    "@ night" (09/18/2013)   Migraine    "once q couple months" (11/26/2018)   Osteoporosis    Peripheral neuropathy    Umbilical hernia    unrepaired (09/18/2013)    Allergies Allergies  Allergen Reactions   Codeine Hives, Anxiety and Other (See Comments)    IV Location/Drains/Wounds Patient Lines/Drains/Airways Status     Active Line/Drains/Airways     Name Placement date Placement time Site Days   Peripheral IV 09/12/23 20 G Right Wrist 09/12/23  0244  Wrist  less than 1            Labs/Imaging Results for orders placed or performed during the hospital encounter of 09/12/23 (from the past 48 hour(s))  CBG monitoring, ED     Status: Abnormal   Collection Time: 09/12/23  2:43 AM  Result Value Ref Range   Glucose-Capillary 410 (H) 70 - 99 mg/dL    Comment: Glucose reference range applies only to samples taken after fasting for at least 8 hours.  CBC with  Differential     Status: Abnormal   Collection Time: 09/12/23  3:00 AM  Result Value Ref Range   WBC 12.0 (H) 4.0 - 10.5 K/uL   RBC 4.80 3.87 - 5.11 MIL/uL   Hemoglobin 13.8 12.0 - 15.0 g/dL   HCT 60.6 30.1 - 60.1 %   MCV 85.8 80.0 - 100.0 fL   MCH 28.8 26.0 - 34.0 pg   MCHC 33.5 30.0 - 36.0 g/dL   RDW 09.3 23.5 - 57.3 %   Platelets 454 (H) 150 -  400 K/uL   nRBC 0.0 0.0 - 0.2 %   Neutrophils Relative % 73 %   Neutro Abs 8.8 (H) 1.7 - 7.7 K/uL   Lymphocytes Relative 17 %   Lymphs Abs 2.0 0.7 - 4.0 K/uL   Monocytes Relative 9 %   Monocytes Absolute 1.0 0.1 - 1.0 K/uL   Eosinophils Relative 0 %   Eosinophils Absolute 0.0 0.0 - 0.5 K/uL   Basophils Relative 0 %   Basophils Absolute 0.0 0.0 - 0.1 K/uL   Immature Granulocytes 1 %   Abs Immature Granulocytes 0.08 (H) 0.00 - 0.07 K/uL    Comment: Performed at [facility], [address], [place], [place] [address]  Comprehensive metabolic panel     Status: Abnormal   Collection Time: 09/12/23  3:00 AM  Result Value Ref Range   Sodium 132 (L) 135 - 145 mmol/L   Potassium 3.5 3.5 - 5.1 mmol/L   Chloride 85 (L) 98 - 111 mmol/L   CO2 28 22 - 32 mmol/L   Glucose, Bld 410 (H) 70 - 99 mg/dL    Comment: Glucose reference range applies only to samples taken after fasting for at least 8 hours.   BUN 21 (H) 6 - 20 mg/dL   Creatinine, Ser 1.91 0.44 - 1.00 mg/dL   Calcium 9.1 8.9 - 47.8 mg/dL   Total Protein 8.5 (H) 6.5 - 8.1 g/dL   Albumin 4.5 3.5 - 5.0 g/dL   AST 17 15 - 41 U/L   ALT 18 0 - 44 U/L   Alkaline Phosphatase 84 38 - 126 U/L   Total Bilirubin 0.9 0.3 - 1.2 mg/dL   GFR, Estimated >29 >56 mL/min    Comment: (NOTE) Calculated using the CKD-EPI Creatinine Equation (2021)    Anion gap 19 (H) 5 - 15    Comment: Performed at [facility], [address], [place], [place] [address]  Lipase, blood     Status: Abnormal   Collection Time: 09/12/23  3:00 AM  Result Value Ref Range   Lipase 390  (H) 11 - 51 U/L    Comment: Performed at [facility], [address], [place], [place] [address]  Beta-hydroxybutyric acid     Status: Abnormal   Collection Time: 09/12/23  3:00 AM  Result Value Ref Range   Beta-Hydroxybutyric Acid 1.94 (H) 0.05 - 0.27 mmol/L    Comment: Performed at [facility], [address], [place], [place] [address]  Blood gas, venous     Status: Abnormal   Collection Time: 09/12/23  3:00 AM  Result Value Ref Range   pH, Ven 7.48 (H) 7.25 - 7.43   pCO2, Ven 47 44 - 60 mmHg   pO2, Ven 32 32 - 45 mmHg   Bicarbonate 35.0 (H) 20.0 - 28.0 mmol/L   Acid-Base Excess 10.0 (H) 0.0 - 2.0 mmol/L   O2 Saturation 58 %   Patient temperature 37.0     Comment: Performed at [facility], [address] [place], [place] [address]  CBG monitoring, ED     Status: Abnormal   Collection Time: 09/12/23  4:25 AM  Result Value Ref Range   Glucose-Capillary 250 (H) 70 - 99 mg/dL    Comment: Glucose reference range applies only to samples taken after fasting for at least 8 hours.   US Abdomen Limited RUQ (LIVER/GB)  Result Date: 09/12/2023 CLINICAL DATA:  [age] with acute pancreatitis. EXAM: ULTRASOUND ABDOMEN LIMITED  RIGHT UPPER QUADRANT COMPARISON:  CT with IV contrast 09/02/2023 FINDINGS: Gallbladder: No gallstones or wall thickening visualized. No sonographic Murphy sign noted by sonographer. Common bile duct: Diameter: 5.7 mm mid segment, distally is 8 mm, but no more distended than previously. Older CT of 05/10/2022 also demonstrated this. Today however, there is also mild intrahepatic biliary prominence. Laboratory and clinical correlation advised. Liver: No focal lesion identified. Within normal limits in parenchymal echogenicity. Portal vein is patent on color Doppler imaging with normal direction of blood flow towards the liver. Other: No right upper quadrant ascites. IMPRESSION: 1. Negative for gallstones. Negative for  findings of acute cholecystitis. 2. Mild intrahepatic and extrahepatic biliary prominence. Laboratory and clinical correlation advised. Electronically Signed   By: Almira Bar M.D.   On: 09/12/2023 05:35    Pending Labs Unresulted Labs (From admission, onward)     Start     Ordered   09/19/23 0500  Creatinine, serum  (enoxaparin (LOVENOX)    CrCl >/= 30 ml/min)  Weekly,   R     Comments: while on enoxaparin therapy    09/12/23 0359   09/12/23 0715  CBC  Once,   R        09/12/23 0715   09/12/23 0715  Comprehensive metabolic panel  Once,   R        09/12/23 0715   09/12/23 0715  Lipase, blood  Once,   R        09/12/23 0715   09/12/23 0715  Magnesium  Once,   R        09/12/23 0715   09/12/23 0500  Comprehensive metabolic panel  Daily,   R      09/12/23 0359   09/12/23 0500  CBC  Daily,   R      09/12/23 0359   09/12/23 0237  Urinalysis, Routine w reflex microscopic -Urine, Clean Catch  Once,   URGENT       Question:  Specimen Source  Answer:  Urine, Clean Catch   09/12/23 0236            Vitals/Pain Today's Vitals   09/12/23 0236 09/12/23 0418 09/12/23 0454 09/12/23 0722  BP:    (!) 144/84  Pulse:    93  Resp:    18  Temp:  98.5 F (36.9 C)  98 F (36.7 C)  TempSrc:  Oral  Oral  SpO2:    96%  Weight: 55.3 kg     PainSc: 10-Worst pain ever  Asleep     Isolation Precautions No active isolations  Medications Medications  aspirin EC tablet 81 mg (has no administration in time range)  ranolazine (RANEXA) 12 hr tablet 500 mg (has no administration in time range)  rosuvastatin (CRESTOR) tablet 10 mg (has no administration in time range)  levothyroxine (SYNTHROID) tablet 137 mcg (137 mcg Oral Given 09/12/23 0711)  pantoprazole (PROTONIX) EC tablet 40 mg (has no administration in time range)  tamsulosin (FLOMAX) capsule 0.4 mg (has no administration in time range)  enoxaparin (LOVENOX) injection 40 mg (has no administration in time range)  sodium chloride flush  (NS) 0.9 % injection 3 mL (has no administration in time range)  0.9 %  sodium chloride infusion ( Intravenous New Bag/Given 09/12/23 0622)  acetaminophen (TYLENOL) tablet 650 mg (has no administration in time range)    Or  acetaminophen (TYLENOL) suppository 650 mg (has no administration in time range)  HYDROmorphone (DILAUDID) injection 0.5 mg (0.5 mg Intravenous Given 09/12/23  0421)  polyethylene glycol (MIRALAX / GLYCOLAX) packet 17 g (has no administration in time range)  ondansetron (ZOFRAN) tablet 4 mg (has no administration in time range)    Or  ondansetron (ZOFRAN) injection 4 mg (has no administration in time range)  promethazine (PHENERGAN) 12.5 mg in sodium chloride 0.9 % 50 mL IVPB (has no administration in time range)  lactated ringers bolus 2,000 mL (0 mLs Intravenous Stopped 09/12/23 0704)  droperidol (INAPSINE) 2.5 MG/ML injection 1.25 mg (1.25 mg Intravenous Given 09/12/23 0310)  diphenhydrAMINE (BENADRYL) injection 25 mg (25 mg Intravenous Given 09/12/23 0307)  insulin aspart (novoLOG) injection 15 Units (15 Units Intravenous Given 09/12/23 0352)  potassium chloride 10 mEq in 100 mL IVPB (0 mEq Intravenous Stopped 09/12/23 0704)    Mobility walks with person assist

## 2023-09-12 NOTE — TOC Initial Note (Signed)
Transition of Care [facility]) - Initial/Assessment Note    Patient Details  Name: [name] [name] MRN: [id number] Date of Birth: 1961/05/03  Transition [facility] CM/SW Contact:    Harriett Sine, RN Phone [phone]  09/12/2023, 3:04 PM  Clinical Narrative:                 Pt from home. Pt has pcp, Intimate violence resources added to AVS, TOC following for d/c needs        Patient Goals and CMS Choice            Expected Discharge Plan and Services                                              Prior Living Arrangements/Services                       Activities of Daily Living      Permission Sought/Granted                  Emotional Assessment              Admission diagnosis:  Acute pancreatitis [K85.90] Acute pancreatitis, unspecified complication status, unspecified pancreatitis type [K85.90] Patient Active Problem List   Diagnosis Date Noted   Acute pancreatitis 09/12/2023   Hyperglycemia 09/02/2023   DKA (diabetic ketoacidosis) ([name]) 08/01/2023   Intractable vomiting with nausea 06/14/2023   Orthostatic hypotension 05/14/2023   non obstructive CAD (coronary artery disease) 05/14/2023   Urinary retention 05/14/2023   Uncontrolled type 1 diabetes mellitus with hyperglycemia, with long-term current use of insulin ([name]) 05/14/2023   UTI (urinary tract infection) 05/14/2023   Abnormal CT scan, esophagus 02/05/2023   Gastroparesis 01/07/2023   Chronic pain of both knees 08/20/2022   Intractable nausea and vomiting 03/27/2022   Marijuana abuse 03/27/2022   Bladder wall thickening 03/09/2021   Chronic orthostatic hypotension 03/06/2021   Gastric erosion    Urinary frequency 02/10/2020   Chronic diastolic HF (heart failure) ([name]) 10/12/2019   Duodenal ulcer without hemorrhage or perforation    Constipation    Nausea and vomiting 03/25/2019   MDD (major depressive disorder), recurrent episode, moderate ([name])  07/01/2018   Dehydration 05/26/2018   Chronic chest pain    Noncompliance 01/14/2018   Migraines 12/11/2017   Phrygian cap 06/20/2017   Leukocytosis    NSTEMI (non-ST elevated myocardial infarction) ([name]) 02/11/2017   Paroxysmal A-fib ([name]) 02/01/2015   Kidney disease, chronic, stage 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 - [address] [place] [address] Phone: [phone] Fax: [phone]     Social Determinants of Health (SDOH) Social History: SDOH Screenings   Food Insecurity: No Food Insecurity (09/02/2023)  Housing: Patient Declined (09/02/2023)  Transportation Needs: No Transportation Needs (09/02/2023)  Utilities: Not At Risk (09/02/2023)  Depression (PHQ2-9): High Risk (01/24/2022)  Financial Resource Strain: Low Risk  (01/19/2023)   Received from [name]  Recent Concern: Financial Resource Strain -  Medium Risk (12/26/2022)   Received from [facility]  Physical Activity: Unknown (01/19/2023)   Received [facility]  Social Connections: Moderately Integrated (01/19/2023)   Received from [facility]  Stress: Stress Concern Present (12/26/2022)   Received from [facility], [facility]  Tobacco Use: Medium Risk (09/12/2023)   SDOH Interventions:     Readmission Risk Interventions    08/23/2023   12:19 PM  Readmission Risk Prevention Plan  Transportation Screening Complete  Medication Review (RN Care Manager) Complete  PCP or Specialist appointment within 3-5 days of discharge Complete  HRI or Home Care Consult Complete  SW Recovery  Care/Counseling Consult Complete  Palliative Care Screening Not Applicable  Skilled Nursing Facility Not Applicable

## 2023-09-12 NOTE — Inpatient Diabetes Management (Signed)
Inpatient Diabetes Program Recommendations  AACE/ADA: New Consensus Statement on Inpatient Glycemic Control (2015)  Target Ranges:  Prepandial:   less than 140 mg/dL      Peak postprandial:   less than 180 mg/dL (1-2 hours)      Critically ill patients:  140 - 180 mg/dL   Lab Results  Component Value Date   GLUCAP 250 (H) 09/12/2023   HGBA1C >15.5 (H) 08/22/2023    Review of Glycemic Control  Latest Reference Range & Units 09/12/23 02:43 09/12/23 04:25  Glucose-Capillary 70 - 99 mg/dL 161 (H) 096 (H)  (H): Data is abnormally high  Diabetes history: T1D Outpatient Diabetes medications:  Toujeo 12 units every day Novolog 4 units TID + SSI Current orders for Inpatient glycemic control:  Novolog 15 units x 1 at 03:52  Inpatient Diabetes Program Recommendations:    Semglee 10 units every day Novolog 0-9 units TID and 0-5 units at bedtime Novolog 2 units TID with meals if she consumes at least 50%  Our team spoke with her twice this month regarding DM management and A1C of >15%.  Will continue to follow while inpatient.  Thank you, Dulce Sellar, MSN, CDCES Diabetes Coordinator Inpatient Diabetes Program [phone] (team pager from 8a-5p)

## 2023-09-12 NOTE — Progress Notes (Signed)
-  year-old female with history of type I diabetes, IBS, marijuana use admitted with abdominal pain found to have acute pancreatitis admitted for pain control IV fluid.  He was recently at discharge from the hospital on 09/04/2023 admitted with acute urinary retention.  Will continue n.p.o., IV fluids.  Start [place]sey and SSI

## 2023-09-12 NOTE — ED Provider Notes (Signed)
[place] EMERGENCY DEPARTMENT AT [facility] Provider Note   CSN: [id number] Arrival date & time: 09/12/23  4098     History  Chief Complaint  Patient presents with   Abdominal Pain    [name] [name] is a 62 y.o. female.  62 yo F with a chief complaints of abdominal pain nausea and vomiting.  This been going on since yesterday morning.  She just feels bad.  Has trouble describing it.  I asked her if this felt like when she had diabetic ketoacidosis in the past and she said she was not sure.  No fevers no cough no congestion.   Abdominal Pain      Home Medications Prior to Admission medications   Medication Sig Start Date End Date Taking? Authorizing Provider  aspirin EC 81 MG tablet Take 81 mg by mouth daily. Swallow whole.   Yes [provider]  escitalopram (LEXAPRO) 10 MG tablet Take 10 mg by mouth daily. 09/09/23  Yes [provider]  levothyroxine (SYNTHROID) 137 MCG tablet Take 137 mcg by mouth daily before breakfast. 03/19/22  Yes [provider]  metoCLOPramide (REGLAN) 5 MG tablet Take 1 tablet (5 mg total) by mouth 3 (three) times daily before meals. 02/06/23  Yes Albertine Grates, MD  NOVOLOG FLEXPEN 100 UNIT/ML FlexPen Inject 4 Units into the skin See admin instructions. Inject 4 units into the skin three times a day after meals, PLUS sliding scale coverage   Yes [provider]  pantoprazole (PROTONIX) 40 MG tablet Take 1 tablet (40 mg total) by mouth 2 (two) times daily. 02/06/23 09/12/23 Yes Albertine Grates, MD  QUEtiapine (SEROQUEL) 50 MG tablet Take 50 mg by mouth at bedtime. 09/09/23  Yes [provider]  ranolazine (RANEXA) 500 MG 12 hr tablet Take 500 mg by mouth 2 (two) times daily. 07/23/23  Yes [provider]  rosuvastatin (CRESTOR) 10 MG tablet Take 10 mg by mouth at bedtime. 06/17/23  Yes [provider]  tamsulosin (FLOMAX) 0.4 MG CAPS capsule Take 1 capsule (0.4 mg total) by mouth daily after supper.  09/04/23  Yes Arnetha Courser, MD  TOUJEO SOLOSTAR 300 UNIT/ML Solostar Pen Inject 12 Units into the skin at bedtime. 08/24/23  Yes Lewie Chamber, MD  Continuous Blood Gluc Sensor (DEXCOM G6 SENSOR) MISC INJECT 1 SENSOR INTO SKIN EVERY 10 DAYS Patient taking differently: Inject 1 Device into the skin See admin instructions. Place 1 new device into the skin every 10 days 07/18/22   Excell Seltzer, MD  Continuous Blood Gluc Transmit (DEXCOM G6 TRANSMITTER) MISC USE AS DIRECTED FOR CONTINUOUS GLUCOSE MONITORING. REUSE TRANSMITTER X 90DAYS THEN DISCARD & REPLACE 07/05/21   Bedsole, Amy E, MD  Glucagon, rDNA, (GLUCAGON EMERGENCY) 1 MG KIT Inject 1 mg into the skin See admin instructions. Inject one mg into the skin see administration instructions. Follow package directions for low blood sugar. 06/18/23   [provider]  midodrine (PROAMATINE) 5 MG tablet Take 1 tablet (5 mg total) by mouth 2 (two) times daily with a meal. Hold if your systolic blood pressure  ( top number) is > , please check your blood pressure at home once a day and bring in record for your doctor to review, follow up with your pcp and cardiology regarding your blood pressure medication Patient taking differently: Take 5 mg by mouth See admin instructions. Take 5 mg by mouth in the morning and hold if the Systolic number is greater than 150 02/06/23  Albertine Grates, MD      Allergies    Codeine    Review of Systems   Review of Systems  Gastrointestinal:  Positive for abdominal pain.    Physical Exam Updated Vital Signs BP (!) 158/93 (BP Location: Left Arm)   Pulse 97   Temp 98.5 F (36.9 C) (Oral)   Resp (!) 21   Wt 55.3 kg   LMP 08/11/2012   SpO2 98%   BMI 24.64 kg/m  Physical Exam Vitals and nursing note reviewed.  Constitutional:      General: She is not in acute distress.    Appearance: She is well-developed. She is not diaphoretic.  HENT:     Head: Normocephalic and atraumatic.  Eyes:     Pupils: Pupils are  equal, round, and reactive to light.  Cardiovascular:     Rate and Rhythm: Normal rate and regular rhythm.     Heart sounds: No murmur heard.    No friction rub. No gallop.  Pulmonary:     Effort: Pulmonary effort is normal.     Breath sounds: No wheezing or rales.  Abdominal:     General: There is no distension.     Palpations: Abdomen is soft.     Tenderness: There is no abdominal tenderness.  Musculoskeletal:        General: No tenderness.     Cervical back: Normal range of motion and neck supple.  Skin:    General: Skin is warm and dry.  Neurological:     Mental Status: She is alert and oriented to person, place, and time.  Psychiatric:        Behavior: Behavior normal.     ED Results / Procedures / Treatments   Labs (all labs ordered are listed, but only abnormal results are displayed) Labs Reviewed  CBC WITH DIFFERENTIAL/PLATELET - Abnormal; Notable for the following components:      Result Value   WBC 12.0 (*)    Platelets 454 (*)    Neutro Abs 8.8 (*)    Abs Immature Granulocytes 0.08 (*)    All other components within normal limits  COMPREHENSIVE METABOLIC PANEL - Abnormal; Notable for the following components:   Sodium 132 (*)    Chloride 85 (*)    Glucose, Bld 410 (*)    BUN 21 (*)    Total Protein 8.5 (*)    Anion gap 19 (*)    All other components within normal limits  LIPASE, BLOOD - Abnormal; Notable for the following components:   Lipase 390 (*)    All other components within normal limits  BETA-HYDROXYBUTYRIC ACID - Abnormal; Notable for the following components:   Beta-Hydroxybutyric Acid 1.94 (*)    All other components within normal limits  BLOOD GAS, VENOUS - Abnormal; Notable for the following components:   pH, Ven 7.48 (*)    Bicarbonate 35.0 (*)    Acid-Base Excess 10.0 (*)    All other components within normal limits  CBG MONITORING, ED - Abnormal; Notable for the following components:   Glucose-Capillary 410 (*)    All other components  within normal limits  CBG MONITORING, ED - Abnormal; Notable for the following components:   Glucose-Capillary 250 (*)    All other components within normal limits  URINALYSIS, ROUTINE W REFLEX MICROSCOPIC  LIPASE, BLOOD  COMPREHENSIVE METABOLIC PANEL  MAGNESIUM  CBC    EKG None  Radiology US Abdomen Limited RUQ (LIVER/GB)  Result Date: 09/12/2023 CLINICAL DATA:  [age]  with acute pancreatitis. EXAM: ULTRASOUND ABDOMEN LIMITED RIGHT UPPER QUADRANT COMPARISON:  CT with IV contrast 09/02/2023 FINDINGS: Gallbladder: No gallstones or wall thickening visualized. No sonographic Murphy sign noted by sonographer. Common bile duct: Diameter: 5.7 mm mid segment, distally is 8 mm, but no more distended than previously. Older CT of 05/10/2022 also demonstrated this. Today however, there is also mild intrahepatic biliary prominence. Laboratory and clinical correlation advised. Liver: No focal lesion identified. Within normal limits in parenchymal echogenicity. Portal vein is patent on color Doppler imaging with normal direction of blood flow towards the liver. Other: No right upper quadrant ascites. IMPRESSION: 1. Negative for gallstones. Negative for findings of acute cholecystitis. 2. Mild intrahepatic and extrahepatic biliary prominence. Laboratory and clinical correlation advised. Electronically Signed   By: Almira Bar M.D.   On: 09/12/2023 05:35    Procedures Procedures    Medications Ordered in ED Medications  aspirin EC tablet 81 mg (has no administration in time range)  ranolazine (RANEXA) 12 hr tablet 500 mg (has no administration in time range)  rosuvastatin (CRESTOR) tablet 10 mg (has no administration in time range)  levothyroxine (SYNTHROID) tablet 137 mcg (has no administration in time range)  pantoprazole (PROTONIX) EC tablet 40 mg (has no administration in time range)  tamsulosin (FLOMAX) capsule 0.4 mg (has no administration in time range)  enoxaparin (LOVENOX) injection 40 mg  (has no administration in time range)  sodium chloride flush (NS) 0.9 % injection 3 mL (has no administration in time range)  0.9 %  sodium chloride infusion (has no administration in time range)  acetaminophen (TYLENOL) tablet 650 mg (has no administration in time range)    Or  acetaminophen (TYLENOL) suppository 650 mg (has no administration in time range)  HYDROmorphone (DILAUDID) injection 0.5 mg (0.5 mg Intravenous Given 09/12/23 0421)  polyethylene glycol (MIRALAX / GLYCOLAX) packet 17 g (has no administration in time range)  ondansetron (ZOFRAN) tablet 4 mg (has no administration in time range)    Or  ondansetron (ZOFRAN) injection 4 mg (has no administration in time range)  promethazine (PHENERGAN) 12.5 mg in sodium chloride 0.9 % 50 mL IVPB (has no administration in time range)  potassium chloride 10 mEq in 100 mL IVPB (10 mEq Intravenous New Bag/Given 09/12/23 0526)  lactated ringers bolus 2,000 mL (2,000 mLs Intravenous New Bag/Given 09/12/23 0303)  droperidol (INAPSINE) 2.5 MG/ML injection 1.25 mg (1.25 mg Intravenous Given 09/12/23 0310)  diphenhydrAMINE (BENADRYL) injection 25 mg (25 mg Intravenous Given 09/12/23 0307)  insulin aspart (novoLOG) injection 15 Units (15 Units Intravenous Given 09/12/23 0352)    ED Course/ Medical Decision Making/ A&P                                 Medical Decision Making Amount and/or Complexity of Data Reviewed Labs: ordered.  Risk OTC drugs. Prescription drug management. Decision regarding hospitalization.   62 yo F with a chief complaints of nausea and vomiting.  On my record review patient has had frequent visits for the same.  Typically gets admitted for diabetic ketoacidosis at each visit.  Will obtain a laboratory evaluation bolus of IV fluids treat nausea.  Patient labs without diabetic ketoacidosis.  She has no metabolic acidosis, bicarb is normal, blood sugar 400.  LFTs are unremarkable.  Patient does have an elevated lipase level.   Symptoms could be consistent with acute pancreatitis.  Has not been able to eat all day  today.  Feeling slightly better on repeat assessment but not well enough she think she can go home.  Will discuss with medicine.  The patients results and plan were reviewed and discussed.   Any x-rays performed were independently reviewed by myself.   Differential diagnosis were considered with the presenting HPI.  Medications  aspirin EC tablet 81 mg (has no administration in time range)  ranolazine (RANEXA) 12 hr tablet 500 mg (has no administration in time range)  rosuvastatin (CRESTOR) tablet 10 mg (has no administration in time range)  levothyroxine (SYNTHROID) tablet 137 mcg (has no administration in time range)  pantoprazole (PROTONIX) EC tablet 40 mg (has no administration in time range)  tamsulosin (FLOMAX) capsule 0.4 mg (has no administration in time range)  enoxaparin (LOVENOX) injection 40 mg (has no administration in time range)  sodium chloride flush (NS) 0.9 % injection 3 mL (has no administration in time range)  0.9 %  sodium chloride infusion (has no administration in time range)  acetaminophen (TYLENOL) tablet 650 mg (has no administration in time range)    Or  acetaminophen (TYLENOL) suppository 650 mg (has no administration in time range)  HYDROmorphone (DILAUDID) injection 0.5 mg (0.5 mg Intravenous Given 09/12/23 0421)  polyethylene glycol (MIRALAX / GLYCOLAX) packet 17 g (has no administration in time range)  ondansetron (ZOFRAN) tablet 4 mg (has no administration in time range)    Or  ondansetron (ZOFRAN) injection 4 mg (has no administration in time range)  promethazine (PHENERGAN) 12.5 mg in sodium chloride 0.9 % 50 mL IVPB (has no administration in time range)  potassium chloride 10 mEq in 100 mL IVPB (10 mEq Intravenous New Bag/Given 09/12/23 0526)  lactated ringers bolus 2,000 mL (2,000 mLs Intravenous New Bag/Given 09/12/23 0303)  droperidol (INAPSINE) 2.5 MG/ML injection  1.25 mg (1.25 mg Intravenous Given 09/12/23 0310)  diphenhydrAMINE (BENADRYL) injection 25 mg (25 mg Intravenous Given 09/12/23 0307)  insulin aspart (novoLOG) injection 15 Units (15 Units Intravenous Given 09/12/23 0352)    Vitals:   09/12/23 0230 09/12/23 0236 09/12/23 0418  BP: (!) 158/93    Pulse: 97    Resp: (!) 21    Temp: 97.8 F (36.6 C)  98.5 F (36.9 C)  TempSrc: Oral  Oral  SpO2: 98%    Weight:  55.3 kg     Final diagnoses:  Acute pancreatitis, unspecified complication status, unspecified pancreatitis type    Admission/ observation were discussed with the admitting physician, patient and/or family and they are comfortable with the plan.          Final Clinical Impression(s) / ED Diagnoses Final diagnoses:  Acute pancreatitis, unspecified complication status, unspecified pancreatitis type    Rx / DC Orders ED Discharge Orders     None         Melene Plan, DO 09/12/23 0109

## 2023-09-12 NOTE — ED Triage Notes (Signed)
BIB EMS/ from home/ c/o gen ABD pain with N/V/ hx of gastroparesis, pancreatitis/ elevated BS (451)/ pt A&Ox4

## 2023-09-12 NOTE — H&P (Signed)
History and Physical    [name] [name] ZOX:[id number] DOB: 10-24-1961 DOA: 09/12/2023  PCP: Everardo All, NP   Patient coming from: Home   Chief Complaint: Abdominal pain, N/V   HPI: [name] [name] is a 62 y.o. female with medical history significant for poorly controlled insulin-dependent diabetes mellitus, hypertension, hyperlipidemia, hypothyroidism, and recent admission for acute urinary retention who now presents with abdominal pain, nausea, and vomiting.  Patient reports developing nausea and abdominal pain yesterday morning.  Symptoms have been persistent and worsening since then.  Pain is described as sharp and radiating through to her back.  She has had numerous episodes of nonbloody vomiting.  She denies any associated diarrhea, fever, or chills.  She denies any alcohol use.  She reports a remote history of pancreatitis but does not believe that the etiology was ever identified.  ED Course: Upon arrival to the ED, patient is found to be afebrile and saturating well on room air with normal heart rate and elevated blood pressure.  Labs are most notable for glucose 410, normal bicarbonate, normal creatinine, normal transaminases and bilirubin, amylase 390, WBC 12,000, and platelets 454,000.  Patient was treated in the ED with 2 L of LR, 15 units IV insulin, droperidol, and Benadryl.  Review of Systems:  All other systems reviewed and apart from HPI, are negative.  Past Medical History:  Diagnosis Date   Allergy    seasonal   Anxiety    B12 deficiency    Chest pain 04/05/2017   CHF (congestive heart failure) ([name])    Diabetes mellitus type 1, uncontrolled    "dr. 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) 10/29/2013    Leg cramping    "@ night" (09/18/2013)   Migraine    "once q couple months" (11/26/2018)   Osteoporosis    Peripheral neuropathy    Umbilical hernia    unrepaired (09/18/2013)    Past Surgical History:  Procedure Laterality Date   BIOPSY  06/03/2019   Procedure: BIOPSY;  Surgeon: Lynann Bologna, MD;  Location: [facility];  Service: Endoscopy;;   BIOPSY  08/17/2020   Procedure: BIOPSY;  Surgeon: Tressia Danas, MD;  Location: [facility];  Service: Gastroenterology;;   BIOPSY  02/05/2023   Procedure: BIOPSY;  Surgeon: Meryl Dare, MD;  Location: [facility];  Service: Gastroenterology;;   CESAREAN SECTION  1989   ENTEROSCOPY N/A 06/03/2019   Procedure: ENTEROSCOPY;  Surgeon: Lynann Bologna, MD;  Location: [facility];  Service: Endoscopy;  Laterality: N/A;   ESOPHAGOGASTRODUODENOSCOPY (EGD) WITH PROPOFOL N/A 08/17/2020   Procedure: ESOPHAGOGASTRODUODENOSCOPY (EGD) WITH PROPOFOL;  Surgeon: Tressia Danas, MD;  Location: [facility];  Service: Gastroenterology;  Laterality: N/A;   ESOPHAGOGASTRODUODENOSCOPY (EGD) WITH PROPOFOL N/A 02/05/2023   Procedure: ESOPHAGOGASTRODUODENOSCOPY (EGD) WITH PROPOFOL;  Surgeon: Meryl Dare, MD;  Location: [facility];  Service: Gastroenterology;  Laterality: N/A;   LAPAROSCOPIC ASSISTED VAGINAL HYSTERECTOMY  09/23/2012   Procedure: LAPAROSCOPIC ASSISTED VAGINAL HYSTERECTOMY;  Surgeon: Mitchel Honour, DO;  Location: [facility];  Service: Gynecology;  Laterality: N/A;  pull Dr Vance Gather instrument   LEFT HEART CATH AND CORONARY ANGIOGRAPHY N/A 02/11/2017   Procedure: Left Heart Cath and Coronary Angiography;  Surgeon: Runell Gess, MD;  Location: [facility];  Service: Cardiovascular;  Laterality: N/A;   TUBAL LIGATION Bilateral 1992    Social History:   reports that she quit smoking about 32 years ago. Her smoking use included cigarettes. She started smoking about 42 years ago. She has a 1.2 pack-year smoking history. She has never used  smokeless tobacco. She reports that she does not currently use drugs after having used the following drugs: Marijuana. She reports that she does not drink alcohol.  Allergies  Allergen Reactions   Codeine Hives, Anxiety and Other (See Comments)    Family History  Problem Relation Age of Onset   Diabetes Other    Heart disease Other    Heart failure Mother 36       Her heart skipped a beat and stopped - per pt   Diabetes Maternal Grandmother    Alzheimer's disease Maternal Grandmother    Coronary artery disease Maternal Grandmother    Heart attack Maternal Grandfather    Heart failure Brother    Diabetes Brother    Colon polyps Neg Hx    Esophageal cancer Neg Hx    Liver cancer Neg Hx    Pancreatic cancer Neg Hx    Stomach cancer Neg Hx    Crohn's disease Neg Hx    Rectal cancer Neg Hx      Prior to Admission medications   Medication Sig Start Date End Date Taking? Authorizing Provider  aspirin EC 81 MG tablet Take 81 mg by mouth daily. Swallow whole.    [provider]  Continuous Blood Gluc Sensor (DEXCOM G6 SENSOR) MISC INJECT 1 SENSOR INTO SKIN EVERY 10 DAYS Patient taking differently: Inject 1 Device into the skin See admin instructions. Place 1 new device into the skin every 10 days 07/18/22   Excell Seltzer, MD  Continuous Blood Gluc Transmit (DEXCOM G6 TRANSMITTER) MISC USE AS DIRECTED FOR CONTINUOUS GLUCOSE MONITORING. REUSE TRANSMITTER X 90DAYS THEN DISCARD & REPLACE 07/05/21   Bedsole, Amy E, MD  Glucagon, rDNA, (GLUCAGON EMERGENCY) 1 MG KIT Inject 1 mg into the skin See admin instructions. Inject one mg into the skin see administration instructions. Follow package directions for low blood sugar. 06/18/23   [provider]  levothyroxine (SYNTHROID) 137 MCG tablet Take 137 mcg by mouth daily before breakfast. 03/19/22   [provider]  metoCLOPramide (REGLAN) 5 MG tablet Take 1 tablet (5 mg total) by mouth 3 (three) times daily before meals.  02/06/23   Albertine Grates, MD  midodrine (PROAMATINE) 5 MG tablet Take 1 tablet (5 mg total) by mouth 2 (two) times daily with a meal. Hold if your systolic blood pressure  ( top number) is > , please check your blood pressure at home once a day and bring in record for your doctor to review, follow up with your pcp and cardiology regarding your blood pressure medication Patient taking differently: Take 5 mg by mouth See admin instructions. Take 5 mg by mouth in the morning and hold if the Systolic number is greater than 150 02/06/23   Albertine Grates, MD  NOVOLOG FLEXPEN 100 UNIT/ML FlexPen Inject 4 Units into the skin See admin instructions. Inject 4 units into the skin three times a day after meals, PLUS sliding scale coverage    [provider]  pantoprazole (PROTONIX) 40 MG tablet Take 1 tablet (40 mg total) by mouth 2 (two) times daily. 02/06/23 09/02/23  Albertine Grates, MD  ranolazine (RANEXA) 500 MG 12 hr tablet  Take 500 mg by mouth 2 (two) times daily. 07/23/23   [provider]  rosuvastatin (CRESTOR) 10 MG tablet Take 10 mg by mouth at bedtime. 06/17/23   [provider]  tamsulosin (FLOMAX) 0.4 MG CAPS capsule Take 1 capsule (0.4 mg total) by mouth daily after supper. 09/04/23   Arnetha Courser, MD  TOUJEO SOLOSTAR 300 UNIT/ML Solostar Pen Inject 12 Units into the skin at bedtime. 08/24/23   Lewie Chamber, MD    Physical Exam: Vitals:   09/12/23 0230 09/12/23 0236  BP: (!) 158/93   Pulse: 97   Resp: (!) 21   Temp: 97.8 F (36.6 C)   TempSrc: Oral   SpO2: 98%   Weight:  55.3 kg    Constitutional: NAD, no pallor or diaphoresis   Eyes: PERTLA, lids and conjunctivae normal ENMT: Mucous membranes are moist. Posterior pharynx clear of any exudate or lesions.   Neck: supple, no masses  Respiratory: no wheezing, no crackles. No accessory muscle use.  Cardiovascular: S1 & S2 heard, regular rate and rhythm. No extremity edema.   Abdomen: Soft, generalized tenderness, no guarding.  Bowel sounds active.  Musculoskeletal: no clubbing / cyanosis. No joint deformity upper and lower extremities.   Skin: Skin tears involving UEs. Warm, dry, well-perfused. Neurologic: CN 2-12 grossly intact. Moving all extremities. Alert and oriented.  Psychiatric: Calm. Cooperative.    Labs and Imaging on Admission: I have personally reviewed following labs and imaging studies  CBC: Recent Labs  Lab 09/12/23 0300  WBC 12.0*  NEUTROABS 8.8*  HGB 13.8  HCT 41.2  MCV 85.8  PLT 454*   Basic Metabolic Panel: Recent Labs  Lab 09/12/23 0300  NA 132*  K 3.5  CL 85*  CO2 28  GLUCOSE 410*  BUN 21*  CREATININE 0.78  CALCIUM 9.1   GFR: Estimated Creatinine Clearance: 56.7 mL/min (by C-G formula based on SCr of 0.78 mg/dL). Liver Function Tests: Recent Labs  Lab 09/12/23 0300  AST 17  ALT 18  ALKPHOS 84  BILITOT 0.9  PROT 8.5*  ALBUMIN 4.5   Recent Labs  Lab 09/12/23 0300  LIPASE 390*   No results for input(s): "AMMONIA" in the last 168 hours. Coagulation Profile: No results for input(s): "INR", "PROTIME" in the last 168 hours. Cardiac Enzymes: No results for input(s): "CKTOTAL", "CKMB", "CKMBINDEX", "TROPONINI" in the last 168 hours. BNP (last 3 results) No results for input(s): "PROBNP" in the last 8760 hours. HbA1C: No results for input(s): "HGBA1C" in the last 72 hours. CBG: Recent Labs  Lab 09/12/23 0243  GLUCAP 410*   Lipid Profile: No results for input(s): "CHOL", "HDL", "LDLCALC", "TRIG", "CHOLHDL", "LDLDIRECT" in the last 72 hours. Thyroid Function Tests: No results for input(s): "TSH", "T4TOTAL", "FREET4", "T3FREE", "THYROIDAB" in the last 72 hours. Anemia Panel: No results for input(s): "VITAMINB12", "FOLATE", "FERRITIN", "TIBC", "IRON", "RETICCTPCT" in the last 72 hours. Urine analysis:    Component Value Date/Time   COLORURINE STRAW (A) 09/02/2023 1024   APPEARANCEUR CLEAR 09/02/2023 1024   LABSPEC 1.024 09/02/2023 1024   PHURINE 7.0  09/02/2023 1024   GLUCOSEU >=500 (A) 09/02/2023 1024   HGBUR NEGATIVE 09/02/2023 1024   BILIRUBINUR NEGATIVE 09/02/2023 1024   BILIRUBINUR negative 03/09/2021 1123   KETONESUR 5 (A) 09/02/2023 1024   PROTEINUR 30 (A) 09/02/2023 1024   UROBILINOGEN 0.2 03/09/2021 1123   UROBILINOGEN 0.2 10/28/2013 1723   NITRITE NEGATIVE 09/02/2023 1024   LEUKOCYTESUR NEGATIVE 09/02/2023 1024   Sepsis Labs: @LABRCNTIP (procalcitonin:4,lacticidven:4) )No results found  for this or any previous visit (from the past 240 hour(s)).   Radiological Exams on Admission: No results found.   Assessment/Plan   1. Acute pancreatitis   - Check RUQ [place] and triglyceride level, continue bowel rest, IVF hydration, pain-control    2. Insulin-dependent DM  - A1c was >15.5% in September 2024  - Check CBGs and continue long- and short-acting insulin    3. Hypothyroidism  - Continue levothyroxine    4. Hyperlipidemia  - Continue Crestor    DVT prophylaxis: Lovenox  Code Status: Full  Level of Care: Level of care: Telemetry Family Communication: None present   Disposition Plan:  Patient is from: home  Anticipated d/c is to: Home  Anticipated d/c date is: 10/4 or 09/14/23  Patient currently: Pending tolerance of adequate oral intake, pain-control Consults called: None  Admission status: Observation     Briscoe Deutscher, MD [facility]  09/12/2023, 4:00 AM

## 2023-09-13 LAB — CBC
HCT: 36.7 % (ref 36.0–46.0)
Hemoglobin: 12 g/dL (ref 12.0–15.0)
MCH: 29.1 pg (ref 26.0–34.0)
MCHC: 32.7 g/dL (ref 30.0–36.0)
MCV: 88.9 fL (ref 80.0–100.0)
Platelets: 348 10*3/uL (ref 150–400)
RBC: 4.13 MIL/uL (ref 3.87–5.11)
RDW: 14.7 % (ref 11.5–15.5)
WBC: 10.6 10*3/uL — ABNORMAL HIGH (ref 4.0–10.5)
nRBC: 0 % (ref 0.0–0.2)

## 2023-09-13 LAB — COMPREHENSIVE METABOLIC PANEL
ALT: 14 U/L (ref 0–44)
AST: 14 U/L — ABNORMAL LOW (ref 15–41)
Albumin: 3 g/dL — ABNORMAL LOW (ref 3.5–5.0)
Alkaline Phosphatase: 56 U/L (ref 38–126)
Anion gap: 8 (ref 5–15)
BUN: 14 mg/dL (ref 6–20)
CO2: 22 mmol/L (ref 22–32)
Calcium: 7.6 mg/dL — ABNORMAL LOW (ref 8.9–10.3)
Chloride: 103 mmol/L (ref 98–111)
Creatinine, Ser: 0.49 mg/dL (ref 0.44–1.00)
GFR, Estimated: >60 mL/min (ref >60)
Glucose, Bld: 169 mg/dL — ABNORMAL HIGH (ref 70–99)
Potassium: 3.6 mmol/L (ref 3.5–5.1)
Sodium: 133 mmol/L — ABNORMAL LOW (ref 135–145)
Total Bilirubin: 0.5 mg/dL (ref 0.3–1.2)
Total Protein: 5.7 g/dL — ABNORMAL LOW (ref 6.5–8.1)

## 2023-09-13 LAB — GLUCOSE, CAPILLARY
Glucose-Capillary: 159 mg/dL — ABNORMAL HIGH (ref 70–99)
Glucose-Capillary: 157 mg/dL — ABNORMAL HIGH (ref 70–99)
Glucose-Capillary: 134 mg/dL — ABNORMAL HIGH (ref 70–99)
Glucose-Capillary: 166 mg/dL — ABNORMAL HIGH (ref 70–99)

## 2023-09-13 LAB — TRIGLYCERIDES: Triglycerides: 301 mg/dL — ABNORMAL HIGH (ref <150)

## 2023-09-13 MED ORDER — ACETAMINOPHEN 325 MG PO TABS: 650.0000 mg | ORAL_TABLET | Freq: Four times a day (QID) | ORAL | Status: DC | PRN

## 2023-09-13 MED ORDER — ONDANSETRON HCL 4 MG PO TABS: 4.0000 mg | ORAL_TABLET | Freq: Four times a day (QID) | ORAL | 0 refills | Status: DC | PRN

## 2023-09-13 MED ORDER — OMEGA-3-ACID ETHYL ESTERS 1 G PO CAPS: 2.0000 g | ORAL_CAPSULE | Freq: Two times a day (BID) | ORAL | 4 refills | Status: DC

## 2023-09-13 NOTE — Plan of Care (Signed)
  Problem: Education: Goal: Ability to describe self-care measures that may prevent or decrease complications (Diabetes Survival Skills Education) will improve 09/13/2023 1509 by Roxan Hockey, RN Outcome: Progressing 09/13/2023 1509 by Roxan Hockey, RN Outcome: Progressing Goal: Individualized Educational Video(s) 09/13/2023 1509 by Roxan Hockey, RN Outcome: Progressing 09/13/2023 1509 by Roxan Hockey, RN Outcome: Progressing   Problem: Cardiac: Goal: Ability to maintain an adequate cardiac output will improve 09/13/2023 1509 by Roxan Hockey, RN Outcome: Progressing 09/13/2023 1509 by Roxan Hockey, RN Outcome: Progressing   Problem: Health Behavior/Discharge Planning: Goal: Ability to identify and utilize available resources and services will improve 09/13/2023 1509 by Roxan Hockey, RN Outcome: Progressing 09/13/2023 1509 by Roxan Hockey, RN Outcome: Progressing Goal: Ability to manage health-related needs will improve 09/13/2023 1509 by Roxan Hockey, RN Outcome: Progressing 09/13/2023 1509 by Roxan Hockey, RN Outcome: Progressing

## 2023-09-13 NOTE — Plan of Care (Signed)
  Problem: Education: Goal: Ability to describe self-care measures that may prevent or decrease complications (Diabetes Survival Skills Education) will improve Outcome: Progressing Goal: Individualized Educational Video(s) Outcome: Progressing   Problem: Urinary Elimination: Goal: Ability to achieve and maintain adequate renal perfusion and functioning will improve Outcome: Progressing   Problem: Clinical Measurements: Goal: Diagnostic test results will improve Outcome: Progressing

## 2023-09-14 DIAGNOSIS — K859 Acute pancreatitis without necrosis or infection, unspecified: Secondary | ICD-10-CM | POA: Diagnosis not present

## 2023-09-14 LAB — COMPREHENSIVE METABOLIC PANEL
ALT: 14 U/L (ref 0–44)
AST: 15 U/L (ref 15–41)
Albumin: 3.4 g/dL — ABNORMAL LOW (ref 3.5–5.0)
Alkaline Phosphatase: 56 U/L (ref 38–126)
Anion gap: 11 (ref 5–15)
BUN: 14 mg/dL (ref 6–20)
CO2: 22 mmol/L (ref 22–32)
Calcium: 8.3 mg/dL — ABNORMAL LOW (ref 8.9–10.3)
Chloride: 97 mmol/L — ABNORMAL LOW (ref 98–111)
Creatinine, Ser: 0.63 mg/dL (ref 0.44–1.00)
GFR, Estimated: >60 mL/min (ref >60)
Glucose, Bld: 190 mg/dL — ABNORMAL HIGH (ref 70–99)
Potassium: 4 mmol/L (ref 3.5–5.1)
Sodium: 130 mmol/L — ABNORMAL LOW (ref 135–145)
Total Bilirubin: 0.8 mg/dL (ref 0.3–1.2)
Total Protein: 6.5 g/dL (ref 6.5–8.1)

## 2023-09-14 LAB — CBC
HCT: 42.1 % (ref 36.0–46.0)
Hemoglobin: 13.6 g/dL (ref 12.0–15.0)
MCH: 29.5 pg (ref 26.0–34.0)
MCHC: 32.3 g/dL (ref 30.0–36.0)
MCV: 91.3 fL (ref 80.0–100.0)
Platelets: 368 10*3/uL (ref 150–400)
RBC: 4.61 MIL/uL (ref 3.87–5.11)
RDW: 14.6 % (ref 11.5–15.5)
WBC: 8.3 10*3/uL (ref 4.0–10.5)
nRBC: 0 % (ref 0.0–0.2)

## 2023-09-14 LAB — GLUCOSE, CAPILLARY
Glucose-Capillary: 186 mg/dL — ABNORMAL HIGH (ref 70–99)
Glucose-Capillary: 234 mg/dL — ABNORMAL HIGH (ref 70–99)

## 2023-09-14 MED ORDER — POLYETHYLENE GLYCOL 3350 17 G PO PACK: 17.0000 g | PACK | Freq: Every day | ORAL | 0 refills | Status: DC

## 2023-09-14 NOTE — Plan of Care (Signed)
  Problem: Education: Goal: Ability to describe self-care measures that may prevent or decrease complications (Diabetes Survival Skills Education) will improve Outcome: Progressing Goal: Individualized Educational Video(s) Outcome: Progressing   Problem: Nutritional: Goal: Maintenance of adequate weight for body size and type will improve Outcome: Progressing   Problem: Urinary Elimination: Goal: Ability to achieve and maintain adequate renal perfusion and functioning will improve Outcome: Progressing

## 2023-09-14 NOTE — Discharge Summary (Signed)
Physician Discharge Summary  [name] [name] AOZ:[id number] DOB: December 07, 1961 DOA: 09/12/2023  PCP: Everardo All, NP  Admit date: 09/12/2023 Discharge date: 09/14/2023  Admitted From: Home Disposition: Home  Recommendations for Outpatient Follow-up:  Follow up with PCP in 1-2 weeks Please obtain BMP/CBC in one week Please follow up with GI for recurrent pancreatitis Home Health: None Equipment/Devices: None  Discharge Condition: Stable CODE STATUS: Full code Diet recommendation: Cardiac  Brief/Interim Summary: 62 y.o. female with medical history significant for poorly controlled insulin-dependent diabetes mellitus, hypertension, hyperlipidemia, hypothyroidism, and recent admission for acute urinary retention who now presents with abdominal pain, nausea, and vomiting.Patient reports developing nausea and abdominal pain.She denies any associated diarrhea, fever, or chills.  She denies any alcohol use.  She reports a remote history of pancreatitis but does not believe that the etiology was ever identified.  Upon arrival to the ED, patient is found to be afebrile and saturating well on room air with normal heart rate and elevated blood pressure.  Labs are most notable for glucose 410, normal bicarbonate, normal creatinine, normal transaminases and bilirubin, amylase 390, WBC 12,000, and platelets 454,000.  Discharge Diagnoses:  Principal Problem:   Acute pancreatitis Active Problems:   Uncontrolled type 1 diabetes mellitus with hyperglycemia, with long-term current use of insulin ([name])   Hypothyroidism    #1 acute pancreatitis patient with history of uncontrolled type 1 diabetes.  Workup in the hospital showed right upper quadrant ultrasound negative for gallstones and negative for findings of acute cholecystitis.  Triglycerides were 301.  She was started on Lovaza. Her triglycerides were 184 in the beginning of 2023.  She was treated with IV fluids pain control and bowel rest with which she  improved and discharged home after tolerating a diet.  # #2 type 1 diabetes continue home dose of insulin  # 3 hypothyroidism continue Synthroid  # #4 hyperlipidemia continue Crestor Started Lovaza during hospital stay and has been given prescriptions for the same.  Estimated body mass index is 25.02 kg/m as calculated from the following:   Height as of 08/23/23: 4\' 11"  (1.499 m).   Weight as of this encounter: 56.2 kg.  Discharge Instructions  Discharge Instructions     Diet - low sodium heart healthy   Complete by: As directed    Increase activity slowly   Complete by: As directed       Allergies as of 09/14/2023       Reactions   Codeine Hives, Anxiety, Other (See Comments)        Medication List     TAKE these medications    acetaminophen 325 MG tablet Commonly known as: TYLENOL Take 2 tablets (650 mg total) by mouth every 6 (six) hours as needed for mild pain (or Fever >/= 101).   aspirin EC 81 MG tablet Take 81 mg by mouth daily. Swallow whole.   Dexcom G6 Sensor Misc INJECT 1 SENSOR INTO SKIN EVERY 10 DAYS What changed: See the new instructions.   Dexcom G6 Transmitter Misc USE AS DIRECTED FOR CONTINUOUS GLUCOSE MONITORING. REUSE TRANSMITTER X 90DAYS THEN DISCARD & REPLACE   escitalopram 10 MG tablet Commonly known as: LEXAPRO Take 10 mg by mouth daily.   Glucagon Emergency 1 MG Kit Inject 1 mg into the skin See admin instructions. Inject one mg into the skin see administration instructions. Follow package directions for low blood sugar.   levothyroxine 137 MCG tablet Commonly known as: SYNTHROID Take 137 mcg by mouth daily before breakfast.  metoCLOPramide 5 MG tablet Commonly known as: REGLAN Take 1 tablet (5 mg total) by mouth 3 (three) times daily before meals.   midodrine 5 MG tablet Commonly known as: PROAMATINE Take 1 tablet (5 mg total) by mouth 2 (two) times daily with a meal. Hold if your systolic blood pressure  ( top number) is >  , please check your blood pressure at home once a day and bring in record for your doctor to review, follow up with your pcp and cardiology regarding your blood pressure medication What changed:  when to take this additional instructions   NovoLOG FlexPen 100 UNIT/ML FlexPen Generic drug: insulin aspart Inject 4 Units into the skin See admin instructions. Inject 4 units into the skin three times a day after meals, PLUS sliding scale coverage   omega-3 acid ethyl esters 1 g capsule Commonly known as: LOVAZA Take 2 capsules (2 g total) by mouth 2 (two) times daily.   ondansetron 4 MG tablet Commonly known as: ZOFRAN Take 1 tablet (4 mg total) by mouth every 6 (six) hours as needed for nausea.   pantoprazole 40 MG tablet Commonly known as: Protonix Take 1 tablet (40 mg total) by mouth 2 (two) times daily.   polyethylene glycol 17 g packet Commonly known as: MiraLax Take 17 g by mouth daily.   QUEtiapine 50 MG tablet Commonly known as: SEROQUEL Take 50 mg by mouth at bedtime.   ranolazine 500 MG 12 hr tablet Commonly known as: RANEXA Take 500 mg by mouth 2 (two) times daily.   rosuvastatin 10 MG tablet Commonly known as: CRESTOR Take 10 mg by mouth at bedtime.   tamsulosin 0.4 MG Caps capsule Commonly known as: FLOMAX Take 1 capsule (0.4 mg total) by mouth daily after supper.   Toujeo SoloStar 300 UNIT/ML Solostar Pen Generic drug: insulin glargine (1 Unit Dial) Inject 12 Units into the skin at bedtime.        Follow-up Information     Everardo All, NP Follow up.   Specialty: Family Medicine        Hilarie Fredrickson, MD Follow up.   Specialty: Gastroenterology Why: recurrent pancreatitis Contact information: [address] [place] [address] [phone]                Allergies  Allergen Reactions   Codeine Hives, Anxiety and Other (See Comments)    Consultations:none   Procedures/Studies: US Abdomen Limited RUQ  (LIVER/GB)  Result Date: 09/12/2023 CLINICAL DATA:  [age] with acute pancreatitis. EXAM: ULTRASOUND ABDOMEN LIMITED RIGHT UPPER QUADRANT COMPARISON:  CT with IV contrast 09/02/2023 FINDINGS: Gallbladder: No gallstones or wall thickening visualized. No sonographic Murphy sign noted by sonographer. Common bile duct: Diameter: 5.7 mm mid segment, distally is 8 mm, but no more distended than previously. Older CT of 05/10/2022 also demonstrated this. Today however, there is also mild intrahepatic biliary prominence. Laboratory and clinical correlation advised. Liver: No focal lesion identified. Within normal limits in parenchymal echogenicity. Portal vein is patent on color Doppler imaging with normal direction of blood flow towards the liver. Other: No right upper quadrant ascites. IMPRESSION: 1. Negative for gallstones. Negative for findings of acute cholecystitis. 2. Mild intrahepatic and extrahepatic biliary prominence. Laboratory and clinical correlation advised. Electronically Signed   By: Almira Bar M.D.   On: 09/12/2023 05:35   CT ABDOMEN PELVIS W CONTRAST  Result Date: 09/02/2023 CLINICAL DATA:  Abdominal pain, acute, nonlocalized. Urinary retention. EXAM: CT ABDOMEN AND PELVIS WITH CONTRAST TECHNIQUE:  Multidetector CT imaging of the abdomen and pelvis was performed using the standard protocol following bolus administration of intravenous contrast. RADIATION DOSE REDUCTION: This exam was performed according to the departmental dose-optimization program which includes automated exposure control, adjustment of the mA and/or kV according to patient size and/or use of iterative reconstruction technique. CONTRAST:  OMNIPAQUE IOHEXOL 300 MG/ML  SOLN COMPARISON:  08/22/2023. FINDINGS: Lower chest: No acute findings. Heart is at the upper limits of normal in size. No pericardial or pleural effusion. Distal esophagus is grossly unremarkable. Hepatobiliary: Liver is enlarged, 23.0 cm. Liver and gallbladder  are otherwise unremarkable. No biliary ductal dilatation. Pancreas: Negative. Spleen: Negative. Adrenals/Urinary Tract: Adrenal glands and kidneys are unremarkable. Ureters are decompressed. Bladder is thick-walled and decompressed with a Foley catheter in place. Air within the bladder is presumably iatrogenic in etiology. Stomach/Bowel: Stomach, small bowel, appendix and colon are unremarkable. Vascular/Lymphatic: Atherosclerotic calcification of the aorta. No pathologically enlarged lymph nodes. Reproductive: Hysterectomy.  No adnexal mass. Other: No free fluid. Mesenteries and peritoneum are unremarkable. Small umbilical hernia contains fat. Musculoskeletal: Degenerative changes in the spine. IMPRESSION: 1. No acute findings. 2. Bladder wall thickening may be due to under distension and/or subacute/chronic cystitis. Air within the bladder is presumably due to Foley catheter placement. 3. Hepatomegaly. 4.  Aortic atherosclerosis (ICD10-I70.0). Electronically Signed   By: Leanna Battles M.D.   On: 09/02/2023 15:21   CT ABDOMEN PELVIS W CONTRAST  Result Date: 08/22/2023 CLINICAL DATA:  Abdomen pain nausea vomiting EXAM: CT ABDOMEN AND PELVIS WITH CONTRAST TECHNIQUE: Multidetector CT imaging of the abdomen and pelvis was performed using the standard protocol following bolus administration of intravenous contrast. RADIATION DOSE REDUCTION: This exam was performed according to the departmental dose-optimization program which includes automated exposure control, adjustment of the mA and/or kV according to patient size and/or use of iterative reconstruction technique. CONTRAST:  OMNIPAQUE IOHEXOL 300 MG/ML  SOLN COMPARISON:  CT 08/01/2023, 06/14/2023, 05/14/2023, and prior exams dating back to 06/03/2017 FINDINGS: Lower chest: Lung bases demonstrate no acute airspace disease. Hepatobiliary: Hepatic steatosis. No calcified gallstone. Stable thickening at the gallbladder fundus compared with multiple priors and  likely due to adenomyomatosis. No biliary dilatation Pancreas: Unremarkable. No pancreatic ductal dilatation or surrounding inflammatory changes. Spleen: Normal in size without focal abnormality. Adrenals/Urinary Tract: Adrenal glands are unremarkable. Kidneys are normal, without renal calculi, focal lesion, or hydronephrosis. The bladder is diffusely thick walled in appearance. Stomach/Bowel: Stomach is nonenlarged. There is no dilated small bowel. Colon diverticular disease. Collapsed appearance versus mild colitis type changes of the distal descending and sigmoid colon. Negative appendix Vascular/Lymphatic: Mild aortic atherosclerosis. No aneurysm. No suspicious lymph nodes. Reproductive: Status post hysterectomy. No adnexal masses. Other: Negative for pelvic effusion or free air. Small fat containing umbilical hernia Musculoskeletal: No acute or suspicious osseous abnormality. IMPRESSION: 1. Collapsed appearance versus mild colitis type changes of the distal descending and sigmoid colon. 2. Diffusely thick walled appearance of the bladder, cystitis versus changes related to chronic obstruction. 3. Hepatic steatosis. 4. Aortic atherosclerosis. Aortic Atherosclerosis (ICD10-I70.0). Electronically Signed   By: Jasmine Pang M.D.   On: 08/22/2023 17:15   (Echo, Carotid, EGD, Colonoscopy, ERCP)    Subjective:   Discharge Exam: Vitals:   09/13/23 1945 09/14/23 0350  BP: 98/69 95/64  Pulse: 82 82  Resp: 18 17  Temp: 98.1 F (36.7 C) 98.1 F (36.7 C)  SpO2: 98% 100%   Vitals:   09/13/23 1436 09/13/23 1945 09/14/23 0350 09/14/23 0423  BP: (!) 145/78 98/69 95/64    Pulse: 90 82 82   Resp: 20 18 17    Temp: 97.9 F (36.6 C) 98.1 F (36.7 C) 98.1 F (36.7 C)   TempSrc: Oral Oral Oral   SpO2: 100% 98% 100%   Weight:    56.2 kg    General: Pt is alert, awake, not in acute distress Cardiovascular: RRR, S1/S2 +, no rubs, no gallops Respiratory: CTA bilaterally, no wheezing, no rhonchi Abdominal:  Soft, NT, ND, bowel sounds + Extremities: no edema, no cyanosis    The results of significant diagnostics from this hospitalization (including imaging, microbiology, ancillary and laboratory) are listed below for reference.     Microbiology: No results found for this or any previous visit (from the past 240 hour(s)).   Labs: BNP (last 3 results) No results for input(s): "BNP" in the last 8760 hours. Basic Metabolic Panel: Recent Labs  Lab 09/12/23 0300 09/12/23 0829 09/13/23 0549 09/14/23 0618  NA 132* 130* 133* 130*  K 3.5 4.4 3.6 4.0  CL 85* 95* 103 97*  CO2 28 23 22 22   GLUCOSE 410* 281* 169* 190*  BUN 21* 18 14 14   CREATININE 0.78 0.60 0.49 0.63  CALCIUM 9.1 8.3* 7.6* 8.3*  MG  --  2.2  --   --    Liver Function Tests: Recent Labs  Lab 09/12/23 0300 09/12/23 0829 09/13/23 0549 09/14/23 0618  AST 17 22 14* 15  ALT 18 16 14 14   ALKPHOS 84 68 56 56  BILITOT 0.9 1.5* 0.5 0.8  PROT 8.5* 7.5 5.7* 6.5  ALBUMIN 4.5 3.8 3.0* 3.4*   Recent Labs  Lab 09/12/23 0300 09/12/23 0829  LIPASE 390* 188*   No results for input(s): "AMMONIA" in the last 168 hours. CBC: Recent Labs  Lab 09/12/23 0300 09/12/23 0829 09/13/23 0549 09/14/23 0618  WBC 12.0* 15.9* 10.6* 8.3  NEUTROABS 8.8*  --   --   --   HGB 13.8 12.9 12.0 13.6  HCT 41.2 38.3 36.7 42.1  MCV 85.8 87.4 88.9 91.3  PLT 454* 426* 348 368   Cardiac Enzymes: No results for input(s): "CKTOTAL", "CKMB", "CKMBINDEX", "TROPONINI" in the last 168 hours. BNP: Invalid input(s): "POCBNP" CBG: Recent Labs  Lab 09/13/23 1155 09/13/23 1709 09/13/23 2053 09/14/23 0732 09/14/23 1106  GLUCAP 157* 134* 166* 186* 234*   D-Dimer No results for input(s): "DDIMER" in the last 72 hours. Hgb A1c No results for input(s): "HGBA1C" in the last 72 hours. Lipid Profile Recent Labs    09/13/23 0549  TRIG 301*   Thyroid function studies No results for input(s): "TSH", "T4TOTAL", "T3FREE", "THYROIDAB" in the last 72  hours.  Invalid input(s): "FREET3" Anemia work up No results for input(s): "VITAMINB12", "FOLATE", "FERRITIN", "TIBC", "IRON", "RETICCTPCT" in the last 72 hours. Urinalysis    Component Value Date/Time   COLORURINE STRAW (A) 09/12/2023 1137   APPEARANCEUR CLEAR 09/12/2023 1137   LABSPEC 1.021 09/12/2023 1137   PHURINE 7.0 09/12/2023 1137   GLUCOSEU >=500 (A) 09/12/2023 1137   HGBUR NEGATIVE 09/12/2023 1137   BILIRUBINUR NEGATIVE 09/12/2023 1137   BILIRUBINUR negative 03/09/2021 1123   KETONESUR 20 (A) 09/12/2023 1137   PROTEINUR 30 (A) 09/12/2023 1137   UROBILINOGEN 0.2 03/09/2021 1123   UROBILINOGEN 0.2 10/28/2013 1723   NITRITE NEGATIVE 09/12/2023 1137   LEUKOCYTESUR NEGATIVE 09/12/2023 1137   Sepsis Labs Recent Labs  Lab 09/12/23 0300 09/12/23 0829 09/13/23 0549 09/14/23 0618  WBC 12.0* 15.9* 10.6* 8.3   Microbiology  No results found for this or any previous visit (from the past 240 hour(s)).   Time coordinating discharge: 39 minutes  SIGNED:  Alwyn Ren, MD  [facility] 09/14/2023, 1:53 PM

## 2023-09-17 DIAGNOSIS — F4312 Post-traumatic stress disorder, chronic: Secondary | ICD-10-CM | POA: Diagnosis not present

## 2023-09-17 DIAGNOSIS — F319 Bipolar disorder, unspecified: Secondary | ICD-10-CM | POA: Diagnosis not present

## 2023-09-21 ENCOUNTER — Emergency Department (HOSPITAL_COMMUNITY)
Admission: EM | Admit: 2023-09-21 | Discharge: 2023-09-21 | Disposition: A | Discharge status: Home / Self Care | Payer: BC Managed Care – PPO | Source: Home / Self Care | Attending: Emergency Medicine | Admitting: Emergency Medicine

## 2023-09-21 ENCOUNTER — Other Ambulatory Visit: Payer: Self-pay

## 2023-09-21 ENCOUNTER — Encounter (HOSPITAL_COMMUNITY): Payer: Self-pay

## 2023-09-21 DIAGNOSIS — R1013 Epigastric pain: Secondary | ICD-10-CM | POA: Insufficient documentation

## 2023-09-21 DIAGNOSIS — K209 Esophagitis, unspecified without bleeding: Secondary | ICD-10-CM | POA: Diagnosis not present

## 2023-09-21 DIAGNOSIS — Z7982 Long term (current) use of aspirin: Secondary | ICD-10-CM | POA: Insufficient documentation

## 2023-09-21 DIAGNOSIS — E1165 Type 2 diabetes mellitus with hyperglycemia: Secondary | ICD-10-CM | POA: Diagnosis not present

## 2023-09-21 DIAGNOSIS — I959 Hypotension, unspecified: Secondary | ICD-10-CM | POA: Diagnosis not present

## 2023-09-21 DIAGNOSIS — R739 Hyperglycemia, unspecified: Secondary | ICD-10-CM | POA: Insufficient documentation

## 2023-09-21 DIAGNOSIS — K5730 Diverticulosis of large intestine without perforation or abscess without bleeding: Secondary | ICD-10-CM | POA: Diagnosis not present

## 2023-09-21 DIAGNOSIS — K859 Acute pancreatitis without necrosis or infection, unspecified: Secondary | ICD-10-CM | POA: Diagnosis not present

## 2023-09-21 DIAGNOSIS — K2210 Ulcer of esophagus without bleeding: Secondary | ICD-10-CM | POA: Diagnosis not present

## 2023-09-21 DIAGNOSIS — R1084 Generalized abdominal pain: Secondary | ICD-10-CM | POA: Diagnosis not present

## 2023-09-21 DIAGNOSIS — R000 Tachycardia, unspecified: Secondary | ICD-10-CM | POA: Diagnosis not present

## 2023-09-21 DIAGNOSIS — R935 Abnormal findings on diagnostic imaging of other abdominal regions, including retroperitoneum: Secondary | ICD-10-CM | POA: Diagnosis not present

## 2023-09-21 DIAGNOSIS — R112 Nausea with vomiting, unspecified: Secondary | ICD-10-CM | POA: Insufficient documentation

## 2023-09-21 LAB — CBC WITH DIFFERENTIAL/PLATELET
Abs Immature Granulocytes: 0.08 10*3/uL — ABNORMAL HIGH (ref 0.00–0.07)
Basophils Absolute: 0 10*3/uL (ref 0.0–0.1)
Basophils Relative: 0 %
Eosinophils Absolute: 0 10*3/uL (ref 0.0–0.5)
Eosinophils Relative: 0 %
HCT: 40.9 % (ref 36.0–46.0)
Hemoglobin: 14.1 g/dL (ref 12.0–15.0)
Immature Granulocytes: 1 %
Lymphocytes Relative: 12 %
Lymphs Abs: 1.7 10*3/uL (ref 0.7–4.0)
MCH: 29.4 pg (ref 26.0–34.0)
MCHC: 34.5 g/dL (ref 30.0–36.0)
MCV: 85.4 fL (ref 80.0–100.0)
Monocytes Absolute: 1.1 10*3/uL — ABNORMAL HIGH (ref 0.1–1.0)
Monocytes Relative: 8 %
Neutro Abs: 11.5 10*3/uL — ABNORMAL HIGH (ref 1.7–7.7)
Neutrophils Relative %: 79 %
Platelets: 477 10*3/uL — ABNORMAL HIGH (ref 150–400)
RBC: 4.79 MIL/uL (ref 3.87–5.11)
RDW: 14.4 % (ref 11.5–15.5)
WBC: 14.4 10*3/uL — ABNORMAL HIGH (ref 4.0–10.5)
nRBC: 0 % (ref 0.0–0.2)

## 2023-09-21 LAB — URINALYSIS, ROUTINE W REFLEX MICROSCOPIC
Bacteria, UA: NONE SEEN
Bilirubin Urine: NEGATIVE
Glucose, UA: >=500 mg/dL — AB
Hgb urine dipstick: NEGATIVE
Ketones, ur: 20 mg/dL — AB
Leukocytes,Ua: NEGATIVE
Nitrite: NEGATIVE
Protein, ur: 30 mg/dL — AB
Specific Gravity, Urine: 1.025 (ref 1.005–1.030)
pH: 7 (ref 5.0–8.0)

## 2023-09-21 LAB — COMPREHENSIVE METABOLIC PANEL
ALT: 19 U/L (ref 0–44)
AST: 15 U/L (ref 15–41)
Albumin: 4.3 g/dL (ref 3.5–5.0)
Alkaline Phosphatase: 81 U/L (ref 38–126)
Anion gap: 20 — ABNORMAL HIGH (ref 5–15)
BUN: 23 mg/dL — ABNORMAL HIGH (ref 6–20)
CO2: 28 mmol/L (ref 22–32)
Calcium: 8.7 mg/dL — ABNORMAL LOW (ref 8.9–10.3)
Chloride: 80 mmol/L — ABNORMAL LOW (ref 98–111)
Creatinine, Ser: 0.76 mg/dL (ref 0.44–1.00)
GFR, Estimated: >60 mL/min (ref >60)
Glucose, Bld: 429 mg/dL — ABNORMAL HIGH (ref 70–99)
Potassium: 3.2 mmol/L — ABNORMAL LOW (ref 3.5–5.1)
Sodium: 128 mmol/L — ABNORMAL LOW (ref 135–145)
Total Bilirubin: 1.4 mg/dL — ABNORMAL HIGH (ref 0.3–1.2)
Total Protein: 7.9 g/dL (ref 6.5–8.1)

## 2023-09-21 LAB — CBG MONITORING, ED
Glucose-Capillary: 432 mg/dL — ABNORMAL HIGH (ref 70–99)
Glucose-Capillary: 305 mg/dL — ABNORMAL HIGH (ref 70–99)

## 2023-09-21 LAB — LIPASE, BLOOD: Lipase: 128 U/L — ABNORMAL HIGH (ref 11–51)

## 2023-09-21 MED ORDER — ONDANSETRON HCL 4 MG/2ML IJ SOLN
4.0000 mg | Freq: Once | INTRAMUSCULAR | Status: AC
  Administered 2023-09-21: 4 mg via INTRAVENOUS
  Filled 2023-09-21: qty 2

## 2023-09-21 MED ORDER — SUCRALFATE 1 G PO TABS: 1.0000 g | ORAL_TABLET | Freq: Three times a day (TID) | ORAL | 0 refills | Status: DC

## 2023-09-21 MED ORDER — HYOSCYAMINE SULFATE 0.125 MG SL SUBL: 0.1250 mg | SUBLINGUAL_TABLET | SUBLINGUAL | 0 refills | Status: DC | PRN

## 2023-09-21 MED ORDER — HYDROMORPHONE HCL 1 MG/ML IJ SOLN
1.0000 mg | Freq: Once | INTRAMUSCULAR | Status: AC
  Administered 2023-09-21: 1 mg via INTRAVENOUS
  Filled 2023-09-21: qty 1

## 2023-09-21 MED ORDER — PANTOPRAZOLE SODIUM 40 MG PO TBEC: 40.0000 mg | DELAYED_RELEASE_TABLET | Freq: Two times a day (BID) | ORAL | 0 refills | Status: DC

## 2023-09-21 MED ORDER — HYDROCODONE-ACETAMINOPHEN 5-325 MG PO TABS
1.0000 | ORAL_TABLET | ORAL | 0 refills | Status: DC | PRN
Start: 2023-09-21 — End: 2023-10-28

## 2023-09-21 MED ORDER — HYDROCODONE-ACETAMINOPHEN 5-325 MG PO TABS
1.0000 | ORAL_TABLET | Freq: Once | ORAL | Status: AC
  Administered 2023-09-21: 1 via ORAL
  Filled 2023-09-21: qty 1

## 2023-09-21 MED ORDER — METOCLOPRAMIDE HCL 5 MG/ML IJ SOLN
10.0000 mg | Freq: Once | INTRAMUSCULAR | Status: AC
  Administered 2023-09-21: 10 mg via INTRAVENOUS
  Filled 2023-09-21: qty 2

## 2023-09-21 MED ORDER — INSULIN ASPART 100 UNIT/ML IJ SOLN
15.0000 [IU] | Freq: Once | INTRAMUSCULAR | Status: AC
  Administered 2023-09-21: 15 [IU] via SUBCUTANEOUS
  Filled 2023-09-21: qty 0.15

## 2023-09-21 NOTE — ED Triage Notes (Signed)
BIB EMS/ ABD pain/ see for same recently/ hyperglycemia/ pt reports feeling nauseous w/ no vomiting

## 2023-09-21 NOTE — ED Provider Notes (Signed)
[place] EMERGENCY DEPARTMENT AT [facility] Provider Note   CSN: [id number] Arrival date & time: 09/21/23  1829     History  Chief Complaint  Patient presents with   Abdominal Pain    [name] [name] is a 62 y.o. female.  Presents with abdominal pain, across her upper abdomen with some radiation into the back.  Patient with nausea and vomiting.  She reports that she was just hospitalized for similar symptoms.       Home Medications Prior to Admission medications   Medication Sig Start Date End Date Taking? Authorizing Provider  HYDROcodone-acetaminophen (NORCO/VICODIN) 5-325 MG tablet Take 1 tablet by mouth every 4 (four) hours as needed for moderate pain. 09/21/23  Yes Dora Clauss, Canary Brim, MD  hyoscyamine (LEVSIN/SL) 0.125 MG SL tablet Place 1 tablet (0.125 mg total) under the tongue every 4 (four) hours as needed. 09/21/23  Yes Johnathon Mittal, Canary Brim, MD  sucralfate (CARAFATE) 1 g tablet Take 1 tablet (1 g total) by mouth 4 (four) times daily -  with meals and at bedtime. 09/21/23  Yes Parag Dorton, Canary Brim, MD  acetaminophen (TYLENOL) 325 MG tablet Take 2 tablets (650 mg total) by mouth every 6 (six) hours as needed for mild pain (or Fever >/= 101). 09/13/23   Alwyn Ren, MD  aspirin EC 81 MG tablet Take 81 mg by mouth daily. Swallow whole.    [provider]  Continuous Blood Gluc Sensor (DEXCOM G6 SENSOR) MISC INJECT 1 SENSOR INTO SKIN EVERY 10 DAYS Patient taking differently: Inject 1 Device into the skin See admin instructions. Place 1 new device into the skin every 10 days 07/18/22   Excell Seltzer, MD  Continuous Blood Gluc Transmit (DEXCOM G6 TRANSMITTER) MISC USE AS DIRECTED FOR CONTINUOUS GLUCOSE MONITORING. REUSE TRANSMITTER X 90DAYS THEN DISCARD & REPLACE 07/05/21   Bedsole, Amy E, MD  escitalopram (LEXAPRO) 10 MG tablet Take 10 mg by mouth daily. 09/09/23   [provider]  Glucagon, rDNA, (GLUCAGON EMERGENCY) 1 MG KIT Inject  1 mg into the skin See admin instructions. Inject one mg into the skin see administration instructions. Follow package directions for low blood sugar. 06/18/23   [provider]  levothyroxine (SYNTHROID) 137 MCG tablet Take 137 mcg by mouth daily before breakfast. 03/19/22   [provider]  metoCLOPramide (REGLAN) 5 MG tablet Take 1 tablet (5 mg total) by mouth 3 (three) times daily before meals. 02/06/23   Albertine Grates, MD  midodrine (PROAMATINE) 5 MG tablet Take 1 tablet (5 mg total) by mouth 2 (two) times daily with a meal. Hold if your systolic blood pressure  ( top number) is > , please check your blood pressure at home once a day and bring in record for your doctor to review, follow up with your pcp and cardiology regarding your blood pressure medication Patient taking differently: Take 5 mg by mouth See admin instructions. Take 5 mg by mouth in the morning and hold if the Systolic number is greater than 150 02/06/23   Albertine Grates, MD  NOVOLOG FLEXPEN 100 UNIT/ML FlexPen Inject 4 Units into the skin See admin instructions. Inject 4 units into the skin three times a day after meals, PLUS sliding scale coverage    [provider]  omega-3 acid ethyl esters (LOVAZA) 1 g capsule Take 2 capsules (2 g total) by mouth 2 (two) times daily. 09/13/23   Alwyn Ren, MD  ondansetron (ZOFRAN) 4 MG tablet Take 1 tablet (  4 mg total) by mouth every 6 (six) hours as needed for nausea. 09/13/23   Alwyn Ren, MD  pantoprazole (PROTONIX) 40 MG tablet Take 1 tablet (40 mg total) by mouth 2 (two) times daily. 09/21/23 10/21/23  Gilda Crease, MD  polyethylene glycol (MIRALAX) 17 g packet Take 17 g by mouth daily. 09/14/23   Alwyn Ren, MD  QUEtiapine (SEROQUEL) 50 MG tablet Take 50 mg by mouth at bedtime. 09/09/23   [provider]  ranolazine (RANEXA) 500 MG 12 hr tablet Take 500 mg by mouth 2 (two) times daily. 07/23/23   [provider]   rosuvastatin (CRESTOR) 10 MG tablet Take 10 mg by mouth at bedtime. 06/17/23   [provider]  tamsulosin (FLOMAX) 0.4 MG CAPS capsule Take 1 capsule (0.4 mg total) by mouth daily after supper. 09/04/23   Arnetha Courser, MD  TOUJEO SOLOSTAR 300 UNIT/ML Solostar Pen Inject 12 Units into the skin at bedtime. 08/24/23   Lewie Chamber, MD      Allergies    Codeine    Review of Systems   Review of Systems  Physical Exam Updated Vital Signs BP 114/74   Pulse 89   Temp 98.3 F (36.8 C) (Oral)   Resp 16   Wt 56.2 kg   LMP 08/11/2012   SpO2 96%   BMI 25.04 kg/m  Physical Exam Vitals and nursing note reviewed.  Constitutional:      General: She is not in acute distress.    Appearance: She is well-developed.  HENT:     Head: Normocephalic and atraumatic.     Mouth/Throat:     Mouth: Mucous membranes are moist.  Eyes:     General: Vision grossly intact. Gaze aligned appropriately.     Extraocular Movements: Extraocular movements intact.     Conjunctiva/sclera: Conjunctivae normal.  Cardiovascular:     Rate and Rhythm: Normal rate and regular rhythm.     Pulses: Normal pulses.     Heart sounds: Normal heart sounds, S1 normal and S2 normal. No murmur heard.    No friction rub. No gallop.  Pulmonary:     Effort: Pulmonary effort is normal. No respiratory distress.     Breath sounds: Normal breath sounds.  Abdominal:     General: Bowel sounds are normal.     Palpations: Abdomen is soft.     Tenderness: There is abdominal tenderness in the epigastric area. There is no guarding or rebound.     Hernia: No hernia is present.  Musculoskeletal:        General: No swelling.     Cervical back: Full passive range of motion without pain, normal range of motion and neck supple. No spinous process tenderness or muscular tenderness. Normal range of motion.     Right lower leg: No edema.     Left lower leg: No edema.  Skin:    General: Skin is warm and dry.     Capillary Refill:  Capillary refill takes less than 2 seconds.     Findings: No ecchymosis, erythema, rash or wound.  Neurological:     General: No focal deficit present.     Mental Status: She is alert and oriented to person, place, and time.     GCS: GCS eye subscore is 4. GCS verbal subscore is 5. GCS motor subscore is 6.     Cranial Nerves: Cranial nerves 2-12 are intact.     Sensory: Sensation is intact.  Motor: Motor function is intact.     Coordination: Coordination is intact.  Psychiatric:        Attention and Perception: Attention normal.        Mood and Affect: Mood normal.        Speech: Speech normal.        Behavior: Behavior normal.     ED Results / Procedures / Treatments   Labs (all labs ordered are listed, but only abnormal results are displayed) Labs Reviewed  CBC WITH DIFFERENTIAL/PLATELET - Abnormal; Notable for the following components:      Result Value   WBC 14.4 (*)    Platelets 477 (*)    Neutro Abs 11.5 (*)    Monocytes Absolute 1.1 (*)    Abs Immature Granulocytes 0.08 (*)    All other components within normal limits  COMPREHENSIVE METABOLIC PANEL - Abnormal; Notable for the following components:   Sodium 128 (*)    Potassium 3.2 (*)    Chloride 80 (*)    Glucose, Bld 429 (*)    BUN 23 (*)    Calcium 8.7 (*)    Total Bilirubin 1.4 (*)    Anion gap 20 (*)    All other components within normal limits  LIPASE, BLOOD - Abnormal; Notable for the following components:   Lipase 128 (*)    All other components within normal limits  URINALYSIS, ROUTINE W REFLEX MICROSCOPIC - Abnormal; Notable for the following components:   Color, Urine STRAW (*)    Glucose, UA >=500 (*)    Ketones, ur 20 (*)    Protein, ur 30 (*)    All other components within normal limits  CBG MONITORING, ED - Abnormal; Notable for the following components:   Glucose-Capillary 432 (*)    All other components within normal limits  CBG MONITORING, ED - Abnormal; Notable for the following  components:   Glucose-Capillary 305 (*)    All other components within normal limits    EKG None  Radiology No results found.  Procedures Procedures    Medications Ordered in ED Medications  HYDROmorphone (DILAUDID) injection 1 mg (1 mg Intravenous Given 09/21/23 0246)  ondansetron (ZOFRAN) injection 4 mg (4 mg Intravenous Given 09/21/23 0245)  insulin aspart (novoLOG) injection 15 Units (15 Units Subcutaneous Given 09/21/23 0406)    ED Course/ Medical Decision Making/ A&P                                 Medical Decision Making Amount and/or Complexity of Data Reviewed Labs: ordered.  Risk Prescription drug management.   Differential Diagnosis considered includes, but not limited to: Cholelithiasis; cholecystitis; cholangitis; bowel obstruction; esophagitis; gastritis; peptic ulcer disease; pancreatitis; cardiac.  Presents for evaluation abdominal pain.  Patient reports she was admitted 2 weeks ago for similar problems.  Review of chart reveals that she did have acute pancreatitis at that time.  She appears to have some element of chronic abdominal pain as well, however, because she has had a CT scan in the emergency department once a month for the last 5 months.  Therefore, no obvious masses or anything that would explain pancreatitis.  She denies alcohol use.  Patient's lipase is much lower than it was upon admission the other day.  He has done well here in the department.  I do not feel that she requires repeat admission.  She tells me that she has outpatient gastroenterology follow-up  scheduled.  Treat for possible gastritis, pain medication, follow-up with GI.  Given return precautions.  Patient's blood sugars appear to be very poorly controlled due to noncompliance.  She was given insulin here in the department.        Final Clinical Impression(s) / ED Diagnoses Final diagnoses:  Epigastric pain  Hyperglycemia    Rx / DC Orders ED Discharge Orders           Ordered    pantoprazole (PROTONIX) 40 MG tablet  2 times daily        09/21/23 0605    sucralfate (CARAFATE) 1 g tablet  3 times daily with meals & bedtime        09/21/23 0605    hyoscyamine (LEVSIN/SL) 0.125 MG SL tablet  Every 4 hours PRN        09/21/23 0605    HYDROcodone-acetaminophen (NORCO/VICODIN) 5-325 MG tablet  Every 4 hours PRN        09/21/23 0605              Gilda Crease, MD 09/21/23 [phone]

## 2023-09-22 ENCOUNTER — Emergency Department (HOSPITAL_COMMUNITY): Payer: BC Managed Care – PPO

## 2023-09-22 ENCOUNTER — Other Ambulatory Visit: Payer: Self-pay

## 2023-09-22 ENCOUNTER — Encounter (HOSPITAL_COMMUNITY): Payer: Self-pay | Admitting: Emergency Medicine

## 2023-09-22 ENCOUNTER — Inpatient Hospital Stay (HOSPITAL_COMMUNITY)
Admission: EM | Admit: 2023-09-22 | Discharge: 2023-09-25 | DRG: 381 | Disposition: A | Discharge status: Home / Self Care | Payer: BC Managed Care – PPO | Attending: Family Medicine | Admitting: Family Medicine

## 2023-09-22 DIAGNOSIS — R112 Nausea with vomiting, unspecified: Secondary | ICD-10-CM | POA: Diagnosis not present

## 2023-09-22 DIAGNOSIS — R110 Nausea: Secondary | ICD-10-CM

## 2023-09-22 DIAGNOSIS — K2100 Gastro-esophageal reflux disease with esophagitis, without bleeding: Secondary | ICD-10-CM | POA: Diagnosis present

## 2023-09-22 DIAGNOSIS — R1011 Right upper quadrant pain: Secondary | ICD-10-CM

## 2023-09-22 DIAGNOSIS — E039 Hypothyroidism, unspecified: Secondary | ICD-10-CM | POA: Diagnosis present

## 2023-09-22 DIAGNOSIS — K838 Other specified diseases of biliary tract: Secondary | ICD-10-CM | POA: Diagnosis present

## 2023-09-22 DIAGNOSIS — R739 Hyperglycemia, unspecified: Secondary | ICD-10-CM | POA: Diagnosis not present

## 2023-09-22 DIAGNOSIS — Z8249 Family history of ischemic heart disease and other diseases of the circulatory system: Secondary | ICD-10-CM

## 2023-09-22 DIAGNOSIS — Z79890 Hormone replacement therapy (postmenopausal): Secondary | ICD-10-CM

## 2023-09-22 DIAGNOSIS — F17210 Nicotine dependence, cigarettes, uncomplicated: Secondary | ICD-10-CM | POA: Diagnosis present

## 2023-09-22 DIAGNOSIS — K589 Irritable bowel syndrome without diarrhea: Secondary | ICD-10-CM | POA: Diagnosis present

## 2023-09-22 DIAGNOSIS — Z7982 Long term (current) use of aspirin: Secondary | ICD-10-CM

## 2023-09-22 DIAGNOSIS — E1022 Type 1 diabetes mellitus with diabetic chronic kidney disease: Secondary | ICD-10-CM | POA: Diagnosis present

## 2023-09-22 DIAGNOSIS — J302 Other seasonal allergic rhinitis: Secondary | ICD-10-CM | POA: Diagnosis present

## 2023-09-22 DIAGNOSIS — E1165 Type 2 diabetes mellitus with hyperglycemia: Secondary | ICD-10-CM | POA: Diagnosis not present

## 2023-09-22 DIAGNOSIS — G4489 Other headache syndrome: Secondary | ICD-10-CM | POA: Diagnosis not present

## 2023-09-22 DIAGNOSIS — K209 Esophagitis, unspecified without bleeding: Secondary | ICD-10-CM | POA: Diagnosis present

## 2023-09-22 DIAGNOSIS — N182 Chronic kidney disease, stage 2 (mild): Secondary | ICD-10-CM | POA: Diagnosis present

## 2023-09-22 DIAGNOSIS — Z9851 Tubal ligation status: Secondary | ICD-10-CM

## 2023-09-22 DIAGNOSIS — R1013 Epigastric pain: Secondary | ICD-10-CM | POA: Diagnosis not present

## 2023-09-22 DIAGNOSIS — F4310 Post-traumatic stress disorder, unspecified: Secondary | ICD-10-CM | POA: Diagnosis present

## 2023-09-22 DIAGNOSIS — Z79899 Other long term (current) drug therapy: Secondary | ICD-10-CM

## 2023-09-22 DIAGNOSIS — K2990 Gastroduodenitis, unspecified, without bleeding: Secondary | ICD-10-CM | POA: Diagnosis present

## 2023-09-22 DIAGNOSIS — Z90710 Acquired absence of both cervix and uterus: Secondary | ICD-10-CM

## 2023-09-22 DIAGNOSIS — R109 Unspecified abdominal pain: Principal | ICD-10-CM | POA: Diagnosis present

## 2023-09-22 DIAGNOSIS — Z833 Family history of diabetes mellitus: Secondary | ICD-10-CM

## 2023-09-22 DIAGNOSIS — Z794 Long term (current) use of insulin: Secondary | ICD-10-CM

## 2023-09-22 DIAGNOSIS — E871 Hypo-osmolality and hyponatremia: Secondary | ICD-10-CM | POA: Diagnosis present

## 2023-09-22 DIAGNOSIS — E1043 Type 1 diabetes mellitus with diabetic autonomic (poly)neuropathy: Secondary | ICD-10-CM | POA: Diagnosis present

## 2023-09-22 DIAGNOSIS — K5730 Diverticulosis of large intestine without perforation or abscess without bleeding: Secondary | ICD-10-CM | POA: Diagnosis not present

## 2023-09-22 DIAGNOSIS — K2289 Other specified disease of esophagus: Secondary | ICD-10-CM | POA: Diagnosis present

## 2023-09-22 DIAGNOSIS — R1084 Generalized abdominal pain: Secondary | ICD-10-CM | POA: Diagnosis not present

## 2023-09-22 DIAGNOSIS — E860 Dehydration: Secondary | ICD-10-CM | POA: Diagnosis present

## 2023-09-22 DIAGNOSIS — G8929 Other chronic pain: Secondary | ICD-10-CM | POA: Diagnosis present

## 2023-09-22 DIAGNOSIS — R42 Dizziness and giddiness: Secondary | ICD-10-CM | POA: Diagnosis not present

## 2023-09-22 DIAGNOSIS — Z885 Allergy status to narcotic agent status: Secondary | ICD-10-CM

## 2023-09-22 DIAGNOSIS — E785 Hyperlipidemia, unspecified: Secondary | ICD-10-CM | POA: Diagnosis present

## 2023-09-22 DIAGNOSIS — R935 Abnormal findings on diagnostic imaging of other abdominal regions, including retroperitoneum: Secondary | ICD-10-CM | POA: Diagnosis not present

## 2023-09-22 DIAGNOSIS — K2970 Gastritis, unspecified, without bleeding: Secondary | ICD-10-CM

## 2023-09-22 DIAGNOSIS — K2980 Duodenitis without bleeding: Secondary | ICD-10-CM | POA: Diagnosis present

## 2023-09-22 DIAGNOSIS — K760 Fatty (change of) liver, not elsewhere classified: Secondary | ICD-10-CM | POA: Diagnosis present

## 2023-09-22 DIAGNOSIS — K3189 Other diseases of stomach and duodenum: Secondary | ICD-10-CM | POA: Diagnosis present

## 2023-09-22 DIAGNOSIS — E876 Hypokalemia: Secondary | ICD-10-CM | POA: Diagnosis present

## 2023-09-22 DIAGNOSIS — K269 Duodenal ulcer, unspecified as acute or chronic, without hemorrhage or perforation: Secondary | ICD-10-CM

## 2023-09-22 DIAGNOSIS — K2210 Ulcer of esophagus without bleeding: Principal | ICD-10-CM | POA: Diagnosis present

## 2023-09-22 DIAGNOSIS — K839 Disease of biliary tract, unspecified: Secondary | ICD-10-CM

## 2023-09-22 DIAGNOSIS — K3184 Gastroparesis: Secondary | ICD-10-CM | POA: Diagnosis present

## 2023-09-22 DIAGNOSIS — E8809 Other disorders of plasma-protein metabolism, not elsewhere classified: Secondary | ICD-10-CM | POA: Diagnosis present

## 2023-09-22 DIAGNOSIS — M810 Age-related osteoporosis without current pathological fracture: Secondary | ICD-10-CM | POA: Diagnosis present

## 2023-09-22 DIAGNOSIS — I480 Paroxysmal atrial fibrillation: Secondary | ICD-10-CM | POA: Diagnosis present

## 2023-09-22 LAB — COMPREHENSIVE METABOLIC PANEL
ALT: 19 U/L (ref 0–44)
ALT: 14 U/L (ref 0–44)
AST: 20 U/L (ref 15–41)
AST: 16 U/L (ref 15–41)
Albumin: 4.2 g/dL (ref 3.5–5.0)
Albumin: 3.7 g/dL (ref 3.5–5.0)
Alkaline Phosphatase: 76 U/L (ref 38–126)
Alkaline Phosphatase: 66 U/L (ref 38–126)
Anion gap: 20 — ABNORMAL HIGH (ref 5–15)
Anion gap: 15 (ref 5–15)
BUN: 27 mg/dL — ABNORMAL HIGH (ref 6–20)
BUN: 24 mg/dL — ABNORMAL HIGH (ref 6–20)
CO2: 27 mmol/L (ref 22–32)
CO2: 24 mmol/L (ref 22–32)
Calcium: 8.8 mg/dL — ABNORMAL LOW (ref 8.9–10.3)
Calcium: 7.8 mg/dL — ABNORMAL LOW (ref 8.9–10.3)
Chloride: 86 mmol/L — ABNORMAL LOW (ref 98–111)
Chloride: 92 mmol/L — ABNORMAL LOW (ref 98–111)
Creatinine, Ser: 0.82 mg/dL (ref 0.44–1.00)
Creatinine, Ser: 0.64 mg/dL (ref 0.44–1.00)
GFR, Estimated: >60 mL/min (ref >60)
GFR, Estimated: >60 mL/min (ref >60)
Glucose, Bld: 340 mg/dL — ABNORMAL HIGH (ref 70–99)
Glucose, Bld: 254 mg/dL — ABNORMAL HIGH (ref 70–99)
Potassium: 3.2 mmol/L — ABNORMAL LOW (ref 3.5–5.1)
Potassium: 3.1 mmol/L — ABNORMAL LOW (ref 3.5–5.1)
Sodium: 133 mmol/L — ABNORMAL LOW (ref 135–145)
Sodium: 131 mmol/L — ABNORMAL LOW (ref 135–145)
Total Bilirubin: 1.1 mg/dL (ref 0.3–1.2)
Total Bilirubin: 1 mg/dL (ref 0.3–1.2)
Total Protein: 7.6 g/dL (ref 6.5–8.1)
Total Protein: 6.9 g/dL (ref 6.5–8.1)

## 2023-09-22 LAB — BLOOD GAS, VENOUS
Acid-Base Excess: 2.5 mmol/L — ABNORMAL HIGH (ref 0.0–2.0)
Bicarbonate: 28.4 mmol/L — ABNORMAL HIGH (ref 20.0–28.0)
O2 Saturation: 99.7 %
Patient temperature: 37
pCO2, Ven: 48 mm[Hg] (ref 44–60)
pH, Ven: 7.38 (ref 7.25–7.43)
pO2, Ven: 85 mm[Hg] — ABNORMAL HIGH (ref 32–45)

## 2023-09-22 LAB — CBC WITH DIFFERENTIAL/PLATELET
Abs Immature Granulocytes: 0.12 10*3/uL — ABNORMAL HIGH (ref 0.00–0.07)
Basophils Absolute: 0 10*3/uL (ref 0.0–0.1)
Basophils Relative: 0 %
Eosinophils Absolute: 0 10*3/uL (ref 0.0–0.5)
Eosinophils Relative: 0 %
HCT: 43.7 % (ref 36.0–46.0)
Hemoglobin: 14.4 g/dL (ref 12.0–15.0)
Immature Granulocytes: 1 %
Lymphocytes Relative: 13 %
Lymphs Abs: 2.1 10*3/uL (ref 0.7–4.0)
MCH: 29 pg (ref 26.0–34.0)
MCHC: 33 g/dL (ref 30.0–36.0)
MCV: 88.1 fL (ref 80.0–100.0)
Monocytes Absolute: 0.9 10*3/uL (ref 0.1–1.0)
Monocytes Relative: 6 %
Neutro Abs: 13.1 10*3/uL — ABNORMAL HIGH (ref 1.7–7.7)
Neutrophils Relative %: 80 %
Platelets: 493 10*3/uL — ABNORMAL HIGH (ref 150–400)
RBC: 4.96 MIL/uL (ref 3.87–5.11)
RDW: 14.7 % (ref 11.5–15.5)
WBC: 16.4 10*3/uL — ABNORMAL HIGH (ref 4.0–10.5)
nRBC: 0 % (ref 0.0–0.2)

## 2023-09-22 LAB — GLUCOSE, CAPILLARY
Glucose-Capillary: 177 mg/dL — ABNORMAL HIGH (ref 70–99)
Glucose-Capillary: 143 mg/dL — ABNORMAL HIGH (ref 70–99)
Glucose-Capillary: 173 mg/dL — ABNORMAL HIGH (ref 70–99)

## 2023-09-22 LAB — CBG MONITORING, ED: Glucose-Capillary: 244 mg/dL — ABNORMAL HIGH (ref 70–99)

## 2023-09-22 LAB — BETA-HYDROXYBUTYRIC ACID: Beta-Hydroxybutyric Acid: 2.06 mmol/L — ABNORMAL HIGH (ref 0.05–0.27)

## 2023-09-22 LAB — LIPASE, BLOOD: Lipase: 25 U/L (ref 11–51)

## 2023-09-22 MED ORDER — SODIUM CHLORIDE 0.9 % IV BOLUS
1000.0000 mL | Freq: Once | INTRAVENOUS | Status: AC
  Administered 2023-09-22: 1000 mL via INTRAVENOUS

## 2023-09-22 MED ORDER — RANOLAZINE ER 500 MG PO TB12
500.0000 mg | ORAL_TABLET | Freq: Two times a day (BID) | ORAL | Status: DC
  Administered 2023-09-22 – 2023-09-25 (×6): 500 mg via ORAL
  Filled 2023-09-22 (×7): qty 1

## 2023-09-22 MED ORDER — INSULIN GLARGINE-YFGN 100 UNIT/ML ~~LOC~~ SOLN
6.0000 [IU] | Freq: Every day | SUBCUTANEOUS | Status: DC
  Administered 2023-09-22 – 2023-09-24 (×3): 6 [IU] via SUBCUTANEOUS
  Filled 2023-09-22 (×4): qty 0.06

## 2023-09-22 MED ORDER — ACETAMINOPHEN 650 MG RE SUPP: 650.0000 mg | Freq: Four times a day (QID) | RECTAL | Status: DC | PRN

## 2023-09-22 MED ORDER — ACETAMINOPHEN 325 MG PO TABS: 650.0000 mg | ORAL_TABLET | Freq: Four times a day (QID) | ORAL | Status: DC | PRN

## 2023-09-22 MED ORDER — INSULIN ASPART 100 UNIT/ML IJ SOLN
4.0000 [IU] | Freq: Three times a day (TID) | INTRAMUSCULAR | Status: DC
  Administered 2023-09-23 (×2): 4 [IU] via SUBCUTANEOUS
  Filled 2023-09-22: qty 0.04

## 2023-09-22 MED ORDER — LACTATED RINGERS IV SOLN: INTRAVENOUS | Status: AC

## 2023-09-22 MED ORDER — INSULIN GLARGINE 100 UNIT/ML ~~LOC~~ SOLN
12.0000 [IU] | Freq: Every day | SUBCUTANEOUS | Status: DC
  Filled 2023-09-22: qty 0.12

## 2023-09-22 MED ORDER — HYDROCODONE-ACETAMINOPHEN 5-325 MG PO TABS
1.0000 | ORAL_TABLET | ORAL | Status: DC | PRN
  Administered 2023-09-22 – 2023-09-24 (×6): 1 via ORAL
  Filled 2023-09-22 (×6): qty 1

## 2023-09-22 MED ORDER — ALBUTEROL SULFATE (2.5 MG/3ML) 0.083% IN NEBU: 2.5000 mg | INHALATION_SOLUTION | RESPIRATORY_TRACT | Status: DC | PRN

## 2023-09-22 MED ORDER — ROSUVASTATIN CALCIUM 10 MG PO TABS
10.0000 mg | ORAL_TABLET | Freq: Every day | ORAL | Status: DC
  Administered 2023-09-22 – 2023-09-24 (×3): 10 mg via ORAL
  Filled 2023-09-22 (×3): qty 1
  Filled 2023-09-22: qty 0.5

## 2023-09-22 MED ORDER — DEXTROSE 50 % IV SOLN: 0.0000 mL | INTRAVENOUS | Status: DC | PRN

## 2023-09-22 MED ORDER — ASPIRIN 81 MG PO TBEC
81.0000 mg | DELAYED_RELEASE_TABLET | Freq: Every day | ORAL | Status: DC
  Administered 2023-09-23 – 2023-09-25 (×3): 81 mg via ORAL
  Filled 2023-09-22 (×3): qty 1

## 2023-09-22 MED ORDER — TRAZODONE HCL 50 MG PO TABS
25.0000 mg | ORAL_TABLET | Freq: Every evening | ORAL | Status: DC | PRN
  Administered 2023-09-22: 25 mg via ORAL
  Filled 2023-09-22: qty 1

## 2023-09-22 MED ORDER — INSULIN ASPART 100 UNIT/ML IJ SOLN
0.0000 [IU] | Freq: Three times a day (TID) | INTRAMUSCULAR | Status: DC
  Administered 2023-09-22: 3 [IU] via SUBCUTANEOUS
  Administered 2023-09-22: 2 [IU] via SUBCUTANEOUS
  Administered 2023-09-23 – 2023-09-24 (×2): 3 [IU] via SUBCUTANEOUS
  Administered 2023-09-25: 5 [IU] via SUBCUTANEOUS
  Filled 2023-09-22: qty 0.15

## 2023-09-22 MED ORDER — INSULIN ASPART 100 UNIT/ML IJ SOLN
0.0000 [IU] | Freq: Every day | INTRAMUSCULAR | Status: DC
  Filled 2023-09-22: qty 0.05

## 2023-09-22 MED ORDER — ESCITALOPRAM OXALATE 10 MG PO TABS
10.0000 mg | ORAL_TABLET | Freq: Every day | ORAL | Status: DC
  Administered 2023-09-23 – 2023-09-25 (×3): 10 mg via ORAL
  Filled 2023-09-22 (×3): qty 1

## 2023-09-22 MED ORDER — LACTATED RINGERS IV BOLUS
20.0000 mL/kg | Freq: Once | INTRAVENOUS | Status: AC
  Administered 2023-09-22: 1124 mL via INTRAVENOUS

## 2023-09-22 MED ORDER — INSULIN REGULAR(HUMAN) IN NACL 100-0.9 UT/100ML-% IV SOLN
INTRAVENOUS | Status: DC
  Administered 2023-09-22: 5.5 [IU]/h via INTRAVENOUS
  Filled 2023-09-22: qty 100

## 2023-09-22 MED ORDER — TAMSULOSIN HCL 0.4 MG PO CAPS
0.4000 mg | ORAL_CAPSULE | Freq: Every day | ORAL | Status: DC
  Administered 2023-09-23 – 2023-09-25 (×3): 0.4 mg via ORAL
  Filled 2023-09-22 (×4): qty 1

## 2023-09-22 MED ORDER — POTASSIUM CHLORIDE 10 MEQ/100ML IV SOLN
10.0000 meq | INTRAVENOUS | Status: AC
  Administered 2023-09-22 (×4): 10 meq via INTRAVENOUS
  Filled 2023-09-22 (×4): qty 100

## 2023-09-22 MED ORDER — ENOXAPARIN SODIUM 40 MG/0.4ML IJ SOSY
40.0000 mg | PREFILLED_SYRINGE | INTRAMUSCULAR | Status: DC
  Administered 2023-09-22 – 2023-09-23 (×2): 40 mg via SUBCUTANEOUS
  Filled 2023-09-22 (×2): qty 0.4

## 2023-09-22 MED ORDER — MIDODRINE HCL 5 MG PO TABS
5.0000 mg | ORAL_TABLET | Freq: Every day | ORAL | Status: DC
  Administered 2023-09-22 – 2023-09-25 (×4): 5 mg via ORAL
  Filled 2023-09-22 (×4): qty 1

## 2023-09-22 MED ORDER — HYDROMORPHONE HCL 1 MG/ML IJ SOLN
1.0000 mg | Freq: Once | INTRAMUSCULAR | Status: AC
  Administered 2023-09-22: 1 mg via INTRAVENOUS
  Filled 2023-09-22: qty 1

## 2023-09-22 MED ORDER — QUETIAPINE FUMARATE 25 MG PO TABS
50.0000 mg | ORAL_TABLET | Freq: Every day | ORAL | Status: DC
  Administered 2023-09-22 – 2023-09-24 (×3): 50 mg via ORAL
  Filled 2023-09-22: qty 2
  Filled 2023-09-22: qty 1
  Filled 2023-09-22: qty 2
  Filled 2023-09-22: qty 1
  Filled 2023-09-22 (×2): qty 2

## 2023-09-22 MED ORDER — IOHEXOL 300 MG/ML  SOLN
100.0000 mL | Freq: Once | INTRAMUSCULAR | Status: AC | PRN
  Administered 2023-09-22: 100 mL via INTRAVENOUS

## 2023-09-22 MED ORDER — SUCRALFATE 1 G PO TABS
1.0000 g | ORAL_TABLET | Freq: Three times a day (TID) | ORAL | Status: DC
  Administered 2023-09-22 – 2023-09-23 (×6): 1 g via ORAL
  Filled 2023-09-22 (×6): qty 1

## 2023-09-22 MED ORDER — DEXTROSE IN LACTATED RINGERS 5 % IV SOLN: INTRAVENOUS | Status: DC

## 2023-09-22 MED ORDER — ONDANSETRON HCL 4 MG PO TABS
4.0000 mg | ORAL_TABLET | Freq: Four times a day (QID) | ORAL | Status: DC | PRN
  Administered 2023-09-22: 4 mg via ORAL
  Filled 2023-09-22: qty 1

## 2023-09-22 MED ORDER — ONDANSETRON HCL 4 MG/2ML IJ SOLN
4.0000 mg | Freq: Once | INTRAMUSCULAR | Status: AC
  Administered 2023-09-22: 4 mg via INTRAVENOUS
  Filled 2023-09-22: qty 2

## 2023-09-22 MED ORDER — ONDANSETRON HCL 4 MG/2ML IJ SOLN
4.0000 mg | Freq: Four times a day (QID) | INTRAMUSCULAR | Status: DC | PRN
  Administered 2023-09-22: 4 mg via INTRAVENOUS
  Filled 2023-09-22: qty 2

## 2023-09-22 MED ORDER — PANTOPRAZOLE SODIUM 40 MG IV SOLR
40.0000 mg | INTRAVENOUS | Status: DC
  Administered 2023-09-22 – 2023-09-23 (×2): 40 mg via INTRAVENOUS
  Filled 2023-09-22 (×2): qty 10

## 2023-09-22 MED ORDER — ALUM & MAG HYDROXIDE-SIMETH 200-200-20 MG/5ML PO SUSP
30.0000 mL | Freq: Once | ORAL | Status: AC
  Administered 2023-09-22: 30 mL via ORAL
  Filled 2023-09-22: qty 30

## 2023-09-22 MED ORDER — LEVOTHYROXINE SODIUM 25 MCG PO TABS
137.0000 ug | ORAL_TABLET | Freq: Every day | ORAL | Status: DC
  Administered 2023-09-23 – 2023-09-25 (×3): 137 ug via ORAL
  Filled 2023-09-22 (×3): qty 1

## 2023-09-22 NOTE — ED Triage Notes (Signed)
Pt BIBA from home presenting with severe epigastric abd pain that began at approx 1700 on 09/21/23. Reports taking pain meds at approx 0000, no relief. Reports nausea, denies vomiting or diarrhea. Hx of GERD and IBS.

## 2023-09-22 NOTE — ED Notes (Signed)
ED TO INPATIENT HANDOFF REPORT  Name/Age/Gender Gilberto Better 62 y.o. female  Code Status    Code Status Orders  (From admission, onward)           Start     Ordered   09/22/23 0921  Full code  Continuous       Question:  By:  Answer:  Consent: discussion documented in EHR   09/22/23 0922           Code Status History     Date Active Date Inactive Code Status Order ID Comments User Context   09/12/2023 0359 09/14/2023 1724 Full Code [id number]  Briscoe Deutscher, MD ED   09/02/2023 1551 09/04/2023 2316 Full Code [id number]  Nolberto Hanlon, MD ED   08/22/2023 2023 08/25/2023 1740 Full Code [id number]  Charlsie Quest, MD ED   08/01/2023 0903 08/03/2023 0000 Full Code [id number]  Olegario Messier, MD ED   06/14/2023 1522 06/17/2023 1341 Full Code [id number]  Maryln Gottron, MD ED   05/14/2023 1659 05/16/2023 1837 Full Code [id number]  Orland Mustard, MD ED   04/19/2023 1442 04/21/2023 2059 Full Code [id number]  Maryln Gottron, MD ED   02/02/2023 1659 02/06/2023 1828 Full Code [id number]  Alwyn Ren, MD ED   01/06/2023 1921 01/08/2023 1944 Full Code [id number]  Anselm Jungling, DO ED   03/27/2022 1948 03/29/2022 1959 Full Code [id number]  Teddy Spike, DO Inpatient   01/18/2022 1249 01/19/2022 1533 Full Code [id number]  Wynetta Fines, MD ED   08/16/2020 1031 08/17/2020 1553 Full Code [id number]  Jae Dire, MD ED   05/30/2019 0546 06/03/2019 2108 Full Code [id number]  Briscoe Deutscher, MD Inpatient   04/23/2019 1450 04/24/2019 1442 Full Code [id number]  Jonah Blue, MD ED   03/25/2019 1934 03/28/2019 0011 Full Code [id number]  Barnetta Chapel, MD Inpatient   03/25/2019 1934 03/25/2019 1934 Full Code [id number]  Barnetta Chapel, MD Inpatient   01/12/2019 1912 01/14/2019 1642 Full Code [id number]  Alba Cory, MD ED   11/26/2018 1539 11/28/2018 1346 Full Code [id number]  Bobette Mo, MD ED   11/26/2018 1530 11/26/2018 1539 Full Code [id number]  Bobette Mo, MD ED   10/19/2018 1541  10/20/2018 2122 Full Code [id number]  Inez Catalina, MD Inpatient   09/22/2018 1645 09/23/2018 1510 Full Code [id number]  Zigmund Daniel., MD Inpatient   09/22/2018 1645 09/22/2018 1645 Full Code [id number]  Zigmund Daniel., MD Inpatient   09/11/2018 1452 09/12/2018 1621 Full Code [id number]  Jonah Blue, MD ED   08/04/2018 1038 08/05/2018 2012 Full Code [id number]  Gwenyth Bender, NP ED   06/27/2018 1633 06/29/2018 1757 Full Code [id number]  Leatha Gilding, MD ED   05/26/2018 0242 05/28/2018 2108 Full Code [id number]  Eduard Clos, MD ED   03/06/2018 2107 03/08/2018 1752 Full Code [id number]  Briscoe Deutscher, MD ED   02/02/2018 1129 02/04/2018 1854 Full Code [id number]  Haydee Monica, MD ED   12/11/2017 1232 12/14/2017 1658 Full Code [id number]  Marcos Eke, PA-C ED   09/19/2017 0746 09/23/2017 1924 Full Code [id number]  Marcos Eke, PA-C ED   09/17/2017 2203 09/18/2017 1940 Full Code [id number]  Clydie Braun, MD ED   06/22/2017 1709 06/24/2017 1930 Full Code [id number]  Jordan Hawks, DO Inpatient   06/03/2017 1626 06/05/2017 1955 Full Code [id number]  Marcos Eke, PA-C ED   04/05/2017 1143 04/08/2017 2117 Full  Code [id number]  Gwenyth Bender, NP ED   03/22/2017 2120 03/23/2017 1831 Full Code [id number]  Alberteen Sam, MD Inpatient   02/09/2017 0958 02/11/2017 2302 Full Code [id number]  Russella Dar, NP ED   07/24/2016 1633 07/26/2016 2113 Full Code [id number]  Ozella Rocks, MD ED   07/24/2016 1633 07/24/2016 1633 Full Code [id number]  Ozella Rocks, MD ED   10/28/2013 2338 10/29/2013 1749 Full Code [id number]  Eddie North, MD ED   09/18/2013 1649 09/20/2013 1637 Full Code [id number]  Hollice Espy, MD Inpatient   09/23/2012 1135 09/25/2012 1753 Full Code [id number]  Pat Patrick, RN Inpatient       Home/SNF/Other Home  Chief Complaint Intractable nausea [R11.0]  Level of Care/Admitting Diagnosis ED Disposition     ED Disposition  Admit    Condition  --   Comment  Hospital Area: [facility] [[id number]]  Level of Care: Med-Surg [16]  May place patient in observation at [facility] or [facility] if equivalent level of care is available:: Yes  Covid Evaluation: Asymptomatic - no recent exposure (last 10 days) testing not required  Diagnosis: Intractable nausea [1877563]  Admitting Physician: Maryln Gottron [[id number]]  Attending Physician: Kirby Crigler, MIR [place] [[id number]]          Medical History Past Medical History:  Diagnosis Date   Allergy    seasonal   Anxiety    B12 deficiency    Chest pain 04/05/2017   CHF (congestive heart failure) ([name])    Diabetes mellitus type 1, uncontrolled    "dr. 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) 10/29/2013   Leg cramping    "@ night" (09/18/2013)   Migraine    "once q couple months" (11/26/2018)   Osteoporosis    Peripheral neuropathy    Umbilical hernia    unrepaired (09/18/2013)    Allergies Allergies  Allergen Reactions   Codeine Hives, Anxiety and Other (See Comments)    IV Location/Drains/Wounds Patient Lines/Drains/Airways Status     Active Line/Drains/Airways     Name Placement date Placement time Site Days   Peripheral IV 09/22/23 20 G Anterior;Left Forearm 09/22/23  0152  Forearm  less than 1            Labs/Imaging Results for orders placed or performed during the hospital encounter of 09/22/23 (from the past 48 hour(s))  CBC with Differential     Status: Abnormal   Collection Time: 09/22/23  1:33 AM  Result Value Ref Range   WBC 16.4 (H) 4.0 - 10.5 K/uL   RBC 4.96 3.87 - 5.11 MIL/uL   Hemoglobin 14.4 12.0 - 15.0 g/dL   HCT 84.1 66.0 - 63.0 %   MCV 88.1 80.0 -  100.0 fL   MCH 29.0 26.0 - 34.0 pg   MCHC 33.0 30.0 - 36.0 g/dL   RDW 16.0 10.9 - 32.3 %   Platelets 493 (H) 150 - 400 K/uL   nRBC 0.0 0.0 - 0.2 %   Neutrophils Relative % 80 %   Neutro Abs 13.1 (H) 1.7 - 7.7 K/uL   Lymphocytes Relative 13 %   Lymphs Abs 2.1 0.7 - 4.0 K/uL   Monocytes Relative  6 %   Monocytes Absolute 0.9 0.1 - 1.0 K/uL   Eosinophils Relative 0 %   Eosinophils Absolute 0.0 0.0 - 0.5 K/uL   Basophils Relative 0 %   Basophils Absolute 0.0 0.0 - 0.1 K/uL   Immature Granulocytes 1 %   Abs Immature Granulocytes 0.12 (H) 0.00 - 0.07 K/uL    Comment: Performed at [facility], [address] [place], [place] [address]  Comprehensive metabolic panel     Status: Abnormal   Collection Time: 09/22/23  1:33 AM  Result Value Ref Range   Sodium 133 (L) 135 - 145 mmol/L   Potassium 3.2 (L) 3.5 - 5.1 mmol/L   Chloride 86 (L) 98 - 111 mmol/L   CO2 27 22 - 32 mmol/L   Glucose, Bld 340 (H) 70 - 99 mg/dL    Comment: Glucose reference range applies only to samples taken after fasting for at least 8 hours.   BUN 27 (H) 6 - 20 mg/dL   Creatinine, Ser 1.02 0.44 - 1.00 mg/dL   Calcium 8.8 (L) 8.9 - 10.3 mg/dL   Total Protein 7.6 6.5 - 8.1 g/dL   Albumin 4.2 3.5 - 5.0 g/dL   AST 20 15 - 41 U/L   ALT 19 0 - 44 U/L   Alkaline Phosphatase 76 38 - 126 U/L   Total Bilirubin 1.1 0.3 - 1.2 mg/dL   GFR, Estimated >72 >53 mL/min    Comment: (NOTE) Calculated using the CKD-EPI Creatinine Equation (2021)    Anion gap 20 (H) 5 - 15    Comment: Performed at [facility], [address] [place], [place] [address]  Lipase, blood     Status: None   Collection Time: 09/22/23  1:33 AM  Result Value Ref Range   Lipase 25 11 - 51 U/L    Comment: Performed at [facility], [address] [place], [place] [address]  Comprehensive metabolic panel     Status: Abnormal   Collection Time: 09/22/23  6:43 AM  Result Value Ref Range   Sodium  131 (L) 135 - 145 mmol/L   Potassium 3.1 (L) 3.5 - 5.1 mmol/L   Chloride 92 (L) 98 - 111 mmol/L   CO2 24 22 - 32 mmol/L   Glucose, Bld 254 (H) 70 - 99 mg/dL    Comment: Glucose reference range applies only to samples taken after fasting for at least 8 hours.   BUN 24 (H) 6 - 20 mg/dL   Creatinine, Ser 5.95 0.44 - 1.00 mg/dL   Calcium 7.8 (L) 8.9 - 10.3 mg/dL   Total Protein 6.9 6.5 - 8.1 g/dL   Albumin 3.7 3.5 - 5.0 g/dL   AST 16 15 - 41 U/L   ALT 14 0 - 44 U/L   Alkaline Phosphatase 66 38 - 126 U/L   Total Bilirubin 1.0 0.3 - 1.2 mg/dL   GFR, Estimated >63 >87 mL/min    Comment: (NOTE) Calculated using the CKD-EPI Creatinine Equation (2021)    Anion gap 15 5 - 15    Comment: Performed at [facility], [address], [place], [place] [address]  Blood gas, venous (at [facility])     Status: Abnormal   Collection Time: 09/22/23  6:43 AM  Result Value Ref Range   pH, Ven 7.38 7.25 - 7.43   pCO2, Ven 48 44 - 60 mmHg   pO2, Ven 85 (H) 32 - 45 mmHg  Bicarbonate 28.4 (H) 20.0 - 28.0 mmol/L   Acid-Base Excess 2.5 (H) 0.0 - 2.0 mmol/L   O2 Saturation 99.7 %   Patient temperature 37.0     Comment: Performed at [facility], [address] [place], [place] [address]  Beta-hydroxybutyric acid     Status: Abnormal   Collection Time: 09/22/23  6:43 AM  Result Value Ref Range   Beta-Hydroxybutyric Acid 2.06 (H) 0.05 - 0.27 mmol/L    Comment: Performed at [facility], [address], [place], [place] [address]  CBG monitoring, ED     Status: Abnormal   Collection Time: 09/22/23  8:43 AM  Result Value Ref Range   Glucose-Capillary 244 (H) 70 - 99 mg/dL    Comment: Glucose reference range applies only to samples taken after fasting for at least 8 hours.   CT ABDOMEN PELVIS W CONTRAST  Result Date: 09/22/2023 CLINICAL DATA:  Epigastric pain, nausea EXAM: CT ABDOMEN AND PELVIS WITH CONTRAST TECHNIQUE: Multidetector CT imaging of  the abdomen and pelvis was performed using the standard protocol following bolus administration of intravenous contrast. RADIATION DOSE REDUCTION: This exam was performed according to the departmental dose-optimization program which includes automated exposure control, adjustment of the mA and/or kV according to patient size and/or use of iterative reconstruction technique. CONTRAST:  OMNIPAQUE IOHEXOL 300 MG/ML  SOLN COMPARISON:  09/02/2023 FINDINGS: Lower chest: Circumferential wall thickening in the distal esophagus suggesting esophagitis. Hepatobiliary: No focal hepatic abnormality. Gallbladder unremarkable. Pancreas: No focal abnormality or ductal dilatation. Spleen: No focal abnormality.  Normal size. Adrenals/Urinary Tract: No adrenal abnormality. No focal renal abnormality. No stones or hydronephrosis. Urinary bladder is unremarkable. Stomach/Bowel: Scattered colonic diverticulosis. No active diverticulitis. Normal appendix. Stomach and small bowel unremarkable. Vascular/Lymphatic: Scattered aortic calcifications. No evidence of aneurysm or adenopathy. Reproductive: Prior hysterectomy.  No adnexal masses. Other: No free fluid or free air. Musculoskeletal: No acute bony abnormality. IMPRESSION: Circumferential wall thickening in the visualized distal esophagus suggesting esophagitis. Colonic diverticulosis.  No active diverticulitis. Scattered aortic atherosclerosis. Electronically Signed   By: Charlett Nose M.D.   On: 09/22/2023 03:58    Pending Labs Unresulted Labs (From admission, onward)     Start     Ordered   09/23/23 0500  Basic metabolic panel  Tomorrow morning,   R        09/22/23 0922   09/23/23 0500  CBC  Tomorrow morning,   R        09/22/23 0922   09/22/23 0142  Urinalysis, Routine w reflex microscopic -Urine, Clean Catch  (ED Abdominal Pain)  Once,   URGENT       Question:  Specimen Source  Answer:  Urine, Clean Catch   09/22/23 0147            Vitals/Pain Today's  Vitals   09/22/23 0325 09/22/23 0540 09/22/23 0750 09/22/23 0926  BP:  99/74 100/79   Pulse:  75 81   Resp:  19 16   Temp:  98.2 F (36.8 C)  98.3 F (36.8 C)  TempSrc:  Oral  Oral  SpO2:  99% 100%   Weight:      Height:      PainSc: Asleep       Isolation Precautions No active isolations  Medications Medications  lactated ringers infusion ( Intravenous Not Given 09/22/23 1100)  dextrose 50 % solution 0-50 mL (has no administration in time range)  potassium chloride 10 mEq in 100 mL IVPB (10  mEq Intravenous New Bag/Given 09/22/23 1049)  aspirin EC tablet 81 mg (has no administration in time range)  HYDROcodone-acetaminophen (NORCO/VICODIN) 5-325 MG per tablet 1 tablet (has no administration in time range)  midodrine (PROAMATINE) tablet 5 mg (has no administration in time range)  ranolazine (RANEXA) 12 hr tablet 500 mg (has no administration in time range)  rosuvastatin (CRESTOR) tablet 10 mg (has no administration in time range)  escitalopram (LEXAPRO) tablet 10 mg (has no administration in time range)  QUEtiapine (SEROQUEL) tablet 50 mg (has no administration in time range)  levothyroxine (SYNTHROID) tablet 137 mcg (has no administration in time range)  tamsulosin (FLOMAX) capsule 0.4 mg (has no administration in time range)  sucralfate (CARAFATE) tablet 1 g (has no administration in time range)  enoxaparin (LOVENOX) injection 40 mg (40 mg Subcutaneous Given 09/22/23 1053)  insulin aspart (novoLOG) injection 0-15 Units (has no administration in time range)  insulin aspart (novoLOG) injection 0-5 Units (has no administration in time range)  pantoprazole (PROTONIX) injection 40 mg (40 mg Intravenous Given 09/22/23 1053)  insulin glargine (1 Unit Dial) (TOUJEO) Solostar Pen SOPN 12 Units (has no administration in time range)  insulin aspart (novoLOG) injection 4 Units (has no administration in time range)  acetaminophen (TYLENOL) tablet 650 mg (has no administration in time  range)    Or  acetaminophen (TYLENOL) suppository 650 mg (has no administration in time range)  traZODone (DESYREL) tablet 25 mg (has no administration in time range)  ondansetron (ZOFRAN) tablet 4 mg (has no administration in time range)    Or  ondansetron (ZOFRAN) injection 4 mg (has no administration in time range)  albuterol (PROVENTIL) (2.5 MG/3ML) 0.083% nebulizer solution 2.5 mg (has no administration in time range)  sodium chloride 0.9 % bolus 1,000 mL (0 mLs Intravenous Stopped 09/22/23 0230)  ondansetron (ZOFRAN) injection 4 mg (4 mg Intravenous Given 09/22/23 0159)  HYDROmorphone (DILAUDID) injection 1 mg (1 mg Intravenous Given 09/22/23 0159)  iohexol (OMNIPAQUE) 300 MG/ML solution 100 mL (100 mLs Intravenous Contrast Given 09/22/23 0327)  alum & mag hydroxide-simeth (MAALOX/MYLANTA) 200-200-20 MG/5ML suspension 30 mL (30 mLs Oral Given 09/22/23 0622)  sodium chloride 0.9 % bolus 1,000 mL (1,000 mLs Intravenous New Bag/Given 09/22/23 1051)  lactated ringers bolus 1,124 mL (0 mLs Intravenous Stopped 09/22/23 1051)    Mobility walks

## 2023-09-22 NOTE — Plan of Care (Signed)

## 2023-09-22 NOTE — ED Provider Notes (Signed)
[name] EMERGENCY DEPARTMENT AT [facility] Provider Note   CSN: [id number] Arrival date & time: 09/22/23  0119     History  Chief Complaint  Patient presents with   Abdominal Pain   HPI [name] [name] is a 62 y.o. female with type 1 diabetes, CHF, gastroparesis, paroxysmal A-fib presenting for abdominal pain.  Started 2 days ago.  Endorses nausea but no vomiting diarrhea.  Denies fever.  Denies alcohol use.  Was evaluated at [facility] yesterday for this.  Lipase was elevated.  But overall workup was reassuring.  Was discharged.  States her abdominal pain has been persistent.  Denies chest pain and shortness of breath.   Abdominal Pain      Home Medications Prior to Admission medications   Medication Sig Start Date End Date Taking? Authorizing Provider  acetaminophen (TYLENOL) 325 MG tablet Take 2 tablets (650 mg total) by mouth every 6 (six) hours as needed for mild pain (or Fever >/= 101). 09/13/23   Alwyn Ren, MD  aspirin EC 81 MG tablet Take 81 mg by mouth daily. Swallow whole.    [provider]  Continuous Blood Gluc Sensor (DEXCOM G6 SENSOR) MISC INJECT 1 SENSOR INTO SKIN EVERY 10 DAYS Patient taking differently: Inject 1 Device into the skin See admin instructions. Place 1 new device into the skin every 10 days 07/18/22   Excell Seltzer, MD  Continuous Blood Gluc Transmit (DEXCOM G6 TRANSMITTER) MISC USE AS DIRECTED FOR CONTINUOUS GLUCOSE MONITORING. REUSE TRANSMITTER X 90DAYS THEN DISCARD & REPLACE 07/05/21   Bedsole, Amy E, MD  escitalopram (LEXAPRO) 10 MG tablet Take 10 mg by mouth daily. 09/09/23   [provider]  Glucagon, rDNA, (GLUCAGON EMERGENCY) 1 MG KIT Inject 1 mg into the skin See admin instructions. Inject one mg into the skin see administration instructions. Follow package directions for low blood sugar. 06/18/23   [provider]  HYDROcodone-acetaminophen (NORCO/VICODIN) 5-325 MG tablet Take 1 tablet by mouth  every 4 (four) hours as needed for moderate pain. 09/21/23   Gilda Crease, MD  hyoscyamine (LEVSIN/SL) 0.125 MG SL tablet Place 1 tablet (0.125 mg total) under the tongue every 4 (four) hours as needed. 09/21/23   Gilda Crease, MD  levothyroxine (SYNTHROID) 137 MCG tablet Take 137 mcg by mouth daily before breakfast. 03/19/22   [provider]  metoCLOPramide (REGLAN) 5 MG tablet Take 1 tablet (5 mg total) by mouth 3 (three) times daily before meals. 02/06/23   Albertine Grates, MD  midodrine (PROAMATINE) 5 MG tablet Take 1 tablet (5 mg total) by mouth 2 (two) times daily with a meal. Hold if your systolic blood pressure  ( top number) is > , please check your blood pressure at home once a day and bring in record for your doctor to review, follow up with your pcp and cardiology regarding your blood pressure medication Patient taking differently: Take 5 mg by mouth See admin instructions. Take 5 mg by mouth in the morning and hold if the Systolic number is greater than 150 02/06/23   Albertine Grates, MD  NOVOLOG FLEXPEN 100 UNIT/ML FlexPen Inject 4 Units into the skin See admin instructions. Inject 4 units into the skin three times a day after meals, PLUS sliding scale coverage    [provider]  omega-3 acid ethyl esters (LOVAZA) 1 g capsule Take 2 capsules (2 g total) by mouth 2 (two) times daily. 09/13/23   Alwyn Ren, MD  ondansetron (ZOFRAN) 4 MG tablet Take 1 tablet (4 mg total) by mouth every 6 (six) hours as needed for nausea. 09/13/23   Alwyn Ren, MD  pantoprazole (PROTONIX) 40 MG tablet Take 1 tablet (40 mg total) by mouth 2 (two) times daily. 09/21/23 10/21/23  Gilda Crease, MD  polyethylene glycol (MIRALAX) 17 g packet Take 17 g by mouth daily. 09/14/23   Alwyn Ren, MD  QUEtiapine (SEROQUEL) 50 MG tablet Take 50 mg by mouth at bedtime. 09/09/23   [provider]  ranolazine (RANEXA) 500 MG 12 hr tablet Take 500 mg by  mouth 2 (two) times daily. 07/23/23   [provider]  rosuvastatin (CRESTOR) 10 MG tablet Take 10 mg by mouth at bedtime. 06/17/23   [provider]  sucralfate (CARAFATE) 1 g tablet Take 1 tablet (1 g total) by mouth 4 (four) times daily -  with meals and at bedtime. 09/21/23   Gilda Crease, MD  tamsulosin (FLOMAX) 0.4 MG CAPS capsule Take 1 capsule (0.4 mg total) by mouth daily after supper. 09/04/23   Arnetha Courser, MD  TOUJEO SOLOSTAR 300 UNIT/ML Solostar Pen Inject 12 Units into the skin at bedtime. 08/24/23   Lewie Chamber, MD      Allergies    Codeine    Review of Systems   Review of Systems  Gastrointestinal:  Positive for abdominal pain.    Physical Exam Updated Vital Signs BP 99/74 (BP Location: Right Arm)   Pulse 75   Temp 98.2 F (36.8 C) (Oral)   Resp 19   Ht 4\' 11"  (1.499 m)   Wt 56.2 kg   LMP 08/11/2012   SpO2 99%   BMI 25.02 kg/m  Physical Exam Vitals and nursing note reviewed.  HENT:     Head: Normocephalic and atraumatic.     Mouth/Throat:     Mouth: Mucous membranes are moist.  Eyes:     General:        Right eye: No discharge.        Left eye: No discharge.     Conjunctiva/sclera: Conjunctivae normal.  Cardiovascular:     Rate and Rhythm: Normal rate and regular rhythm.     Pulses: Normal pulses.     Heart sounds: Normal heart sounds.  Pulmonary:     Effort: Pulmonary effort is normal.     Breath sounds: Normal breath sounds.  Abdominal:     General: Abdomen is flat.     Palpations: Abdomen is soft.     Tenderness: There is abdominal tenderness in the epigastric area.  Skin:    General: Skin is warm and dry.  Neurological:     General: No focal deficit present.  Psychiatric:        Mood and Affect: Mood normal.     ED Results / Procedures / Treatments   Labs (all labs ordered are listed, but only abnormal results are displayed) Labs Reviewed  CBC WITH DIFFERENTIAL/PLATELET - Abnormal; Notable for the following  components:      Result Value   WBC 16.4 (*)    Platelets 493 (*)    Neutro Abs 13.1 (*)    Abs Immature Granulocytes 0.12 (*)    All other components within normal limits  COMPREHENSIVE METABOLIC PANEL - Abnormal; Notable for the following components:   Sodium 133 (*)    Potassium 3.2 (*)    Chloride 86 (*)    Glucose, Bld 340 (*)    BUN 27 (*)  Calcium 8.8 (*)    Anion gap 20 (*)    All other components within normal limits  BLOOD GAS, VENOUS - Abnormal; Notable for the following components:   pO2, Ven 85 (*)    Bicarbonate 28.4 (*)    Acid-Base Excess 2.5 (*)    All other components within normal limits  LIPASE, BLOOD  URINALYSIS, ROUTINE W REFLEX MICROSCOPIC  COMPREHENSIVE METABOLIC PANEL  BETA-HYDROXYBUTYRIC ACID    EKG None  Radiology CT ABDOMEN PELVIS W CONTRAST  Result Date: 09/22/2023 CLINICAL DATA:  Epigastric pain, nausea EXAM: CT ABDOMEN AND PELVIS WITH CONTRAST TECHNIQUE: Multidetector CT imaging of the abdomen and pelvis was performed using the standard protocol following bolus administration of intravenous contrast. RADIATION DOSE REDUCTION: This exam was performed according to the departmental dose-optimization program which includes automated exposure control, adjustment of the mA and/or kV according to patient size and/or use of iterative reconstruction technique. CONTRAST:  OMNIPAQUE IOHEXOL 300 MG/ML  SOLN COMPARISON:  09/02/2023 FINDINGS: Lower chest: Circumferential wall thickening in the distal esophagus suggesting esophagitis. Hepatobiliary: No focal hepatic abnormality. Gallbladder unremarkable. Pancreas: No focal abnormality or ductal dilatation. Spleen: No focal abnormality.  Normal size. Adrenals/Urinary Tract: No adrenal abnormality. No focal renal abnormality. No stones or hydronephrosis. Urinary bladder is unremarkable. Stomach/Bowel: Scattered colonic diverticulosis. No active diverticulitis. Normal appendix. Stomach and small bowel  unremarkable. Vascular/Lymphatic: Scattered aortic calcifications. No evidence of aneurysm or adenopathy. Reproductive: Prior hysterectomy.  No adnexal masses. Other: No free fluid or free air. Musculoskeletal: No acute bony abnormality. IMPRESSION: Circumferential wall thickening in the visualized distal esophagus suggesting esophagitis. Colonic diverticulosis.  No active diverticulitis. Scattered aortic atherosclerosis. Electronically Signed   By: Charlett Nose M.D.   On: 09/22/2023 03:58    Procedures Procedures    Medications Ordered in ED Medications  sodium chloride 0.9 % bolus 1,000 mL (0 mLs Intravenous Stopped 09/22/23 0230)  ondansetron (ZOFRAN) injection 4 mg (4 mg Intravenous Given 09/22/23 0159)  HYDROmorphone (DILAUDID) injection 1 mg (1 mg Intravenous Given 09/22/23 0159)  iohexol (OMNIPAQUE) 300 MG/ML solution 100 mL (100 mLs Intravenous Contrast Given 09/22/23 0327)  alum & mag hydroxide-simeth (MAALOX/MYLANTA) 200-200-20 MG/5ML suspension 30 mL (30 mLs Oral Given 09/22/23 1610)    ED Course/ Medical Decision Making/ A&P                                 Medical Decision Making Amount and/or Complexity of Data Reviewed Labs: ordered. Radiology: ordered.  Risk OTC drugs. Prescription drug management.   Initial Impression and Ddx 62 year old well-appearing female presenting for abdominal pain.  Exam notable for epigastric tenderness.  DDx includes pancreatitis, acute cholecystitis, ACS, reflux, PUD, other. Patient PMH that increases complexity of ED encounter:   type 1 diabetes, CHF, gastroparesis, paroxysmal A-fib  Interpretation of Diagnostics - I independent reviewed and interpreted the labs as followed: Hyperglycemia with anion gap  - I independently visualized the following imaging with scope of interpretation limited to determining acute life threatening conditions related to emergency care: CT abdomen pelvis, which revealed findings consistent with  esophagitis  Patient Reassessment and Ultimate Disposition/Management Initial labs indicating hyperglycemia with gap.  Given she is a type I diabetic and recent admission for DKA.  Felt it warranted further evaluation after fluid resuscitation.  Sent recheck CMP venous blood gas and beta hydroxybutyrate.  CT revealed concern for esophagitis.  Suspect this could be contributing to her pain.  Patient  already on GI regiment along with the PPI.  Advised her to continue to keep taking those medications and follow-up with GI.  Plan for now is to recheck labs to ensure that patient is not in DKA.  Otherwise can be discharged with GI follow-up. Signed out to PA [employer].   Patient management required discussion with the following services or consulting groups:  None  Complexity of Problems Addressed Acute complicated illness or Injury  Additional Data Reviewed and Analyzed Further history obtained from: Past medical history and medications listed in the EMR, Prior ED visit notes, and Recent discharge summary  Patient Encounter Risk Assessment None         Final Clinical Impression(s) / ED Diagnoses Final diagnoses:  Abdominal pain, unspecified abdominal location  Hyperglycemia    Rx / DC Orders ED Discharge Orders     None         Gareth Eagle, PA-C 09/22/23 0701    Dione Booze, MD 09/22/23 [phone]

## 2023-09-22 NOTE — ED Provider Notes (Signed)
T1DM here with abdominal pain and nausea Symptoms started x 2 days (epigastric) No vomiting, diarrhea or fever Seen yesterday for same Can today showing esophagitis  Physical Exam  BP 99/74 (BP Location: Right Arm)   Pulse 75   Temp 98.2 F (36.8 C) (Oral)   Resp 19   Ht 4\' 11"  (1.499 m)   Wt 56.2 kg   LMP 08/11/2012   SpO2 99%   BMI 25.02 kg/m   Physical Exam Ill appearing patient, NAD   Procedures  Procedures  ED Course / MDM    Medical Decision Making Patient care signed out at end of shift by Riki Sheer, PA-C Type 1 DM, h/o DKA, 2nd ED visit in 2 days for abdominal pain, nausea, no vomiting   Amount and/or Complexity of Data Reviewed Labs: ordered. Radiology: ordered. Discussion of management or test interpretation with external provider(s): CT today showing esophagitis Hyperglycemic on arrival, improved with fluids Anion gap and CO2 improving, however, beta hydroxy level elevated Concern that she is headed toward fulminant DKA and admission is felt indicated for observation and stabilization. Patient updated and is agreeable to staying.   Risk OTC drugs. Prescription drug management.   Plan: review repeat labs, ?GI referral for esophagitis   Hyperglycemia H/O T1DM Early DKA Esophagitis      Elpidio Anis, PA-C 09/22/23 4098    Dione Booze, MD 09/22/23 2244

## 2023-09-22 NOTE — H&P (Signed)
History and Physical  [name] [name] GMW:[id number] DOB: 09-27-1961 DOA: 09/22/2023  PCP: Everardo All, NP   Chief Complaint: Nausea, abdominal pain  HPI: [name] [name] is a 62 y.o. female with medical history significant for poorly controlled type 1 diabetes, history of DKA, recent pancreatitis, hypertension, hypothyroidism, CKD stage II being admitted to the hospital with severe abdominal pain and concern for esophagitis.  She was recently discharged from this hospital on 10/5 after stay for pancreatitis.  She then returned to the emergency department 10/12 and 10/13 with complaints of severe nonradiating epigastric abdominal pain.  States it feels different from her chronic abdominal pain, as well as different from her recent pancreatitis pain.  In the emergency department vital signs have been stable except for some hypertension.  Lab work revealed hyperglycemia, leukocytosis, normal renal function, elevated beta hydroxybutyrate and initial anion gap of 20.  She was started on IV insulin drip, repeat labs showed improvement but ER provider requesting observation admission due to severe nausea, dehydration, and concern for risk of developing DKA.  Review of Systems: Please see HPI for pertinent positives and negatives. A complete 10 system review of systems are otherwise negative.  Past Medical History:  Diagnosis Date   Allergy    seasonal   Anxiety    B12 deficiency    Chest pain 04/05/2017   CHF (congestive heart failure) ([name])    Diabetes mellitus type 1, uncontrolled    "dr. 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) 10/29/2013   Leg cramping    "@ night" (09/18/2013)   Migraine    "once q couple months" (11/26/2018)    Osteoporosis    Peripheral neuropathy    Umbilical hernia    unrepaired (09/18/2013)   Past Surgical History:  Procedure Laterality Date   BIOPSY  06/03/2019   Procedure: BIOPSY;  Surgeon: Lynann Bologna, MD;  Location: [facility];  Service: Endoscopy;;   BIOPSY  08/17/2020   Procedure: BIOPSY;  Surgeon: Tressia Danas, MD;  Location: [facility];  Service: Gastroenterology;;   BIOPSY  02/05/2023   Procedure: BIOPSY;  Surgeon: Meryl Dare, MD;  Location: [facility];  Service: Gastroenterology;;   CESAREAN SECTION  1989   ENTEROSCOPY N/A 06/03/2019   Procedure: ENTEROSCOPY;  Surgeon: Lynann Bologna, MD;  Location: [facility];  Service: Endoscopy;  Laterality: N/A;   ESOPHAGOGASTRODUODENOSCOPY (EGD) WITH PROPOFOL N/A 08/17/2020   Procedure: ESOPHAGOGASTRODUODENOSCOPY (EGD) WITH PROPOFOL;  Surgeon: Tressia Danas, MD;  Location: [facility];  Service: Gastroenterology;  Laterality: N/A;   ESOPHAGOGASTRODUODENOSCOPY (EGD) WITH PROPOFOL N/A 02/05/2023   Procedure: ESOPHAGOGASTRODUODENOSCOPY (EGD) WITH PROPOFOL;  Surgeon: Meryl Dare, MD;  Location: [facility];  Service: Gastroenterology;  Laterality: N/A;   LAPAROSCOPIC ASSISTED VAGINAL HYSTERECTOMY  09/23/2012   Procedure: LAPAROSCOPIC ASSISTED VAGINAL HYSTERECTOMY;  Surgeon: Mitchel Honour, DO;  Location: [facility];  Service: Gynecology;  Laterality: N/A;  pull Dr Vance Gather instrument   LEFT HEART CATH AND CORONARY ANGIOGRAPHY N/A 02/11/2017   Procedure: Left Heart Cath and Coronary Angiography;  Surgeon: Runell Gess, MD;  Location: [facility];  Service: Cardiovascular;  Laterality: N/A;   TUBAL LIGATION Bilateral 1992    Social History:  reports that she quit smoking  about 32 years ago. Her smoking use included cigarettes. She started smoking about 42 years ago. She has a 1.2 pack-year smoking history. She has never used smokeless tobacco. She reports that she does not currently use drugs after having used the following  drugs: Marijuana. She reports that she does not drink alcohol.   Allergies  Allergen Reactions   Codeine Hives, Anxiety and Other (See Comments)    Family History  Problem Relation Age of Onset   Diabetes Other    Heart disease Other    Heart failure Mother 52       Her heart skipped a beat and stopped - per pt   Diabetes Maternal Grandmother    Alzheimer's disease Maternal Grandmother    Coronary artery disease Maternal Grandmother    Heart attack Maternal Grandfather    Heart failure Brother    Diabetes Brother    Colon polyps Neg Hx    Esophageal cancer Neg Hx    Liver cancer Neg Hx    Pancreatic cancer Neg Hx    Stomach cancer Neg Hx    Crohn's disease Neg Hx    Rectal cancer Neg Hx      Prior to Admission medications   Medication Sig Start Date End Date Taking? Authorizing Provider  acetaminophen (TYLENOL) 325 MG tablet Take 2 tablets (650 mg total) by mouth every 6 (six) hours as needed for mild pain (or Fever >/= 101). 09/13/23   Alwyn Ren, MD  aspirin EC 81 MG tablet Take 81 mg by mouth daily. Swallow whole.    [provider]  Continuous Blood Gluc Sensor (DEXCOM G6 SENSOR) MISC INJECT 1 SENSOR INTO SKIN EVERY 10 DAYS Patient taking differently: Inject 1 Device into the skin See admin instructions. Place 1 new device into the skin every 10 days 07/18/22   Excell Seltzer, MD  Continuous Blood Gluc Transmit (DEXCOM G6 TRANSMITTER) MISC USE AS DIRECTED FOR CONTINUOUS GLUCOSE MONITORING. REUSE TRANSMITTER X 90DAYS THEN DISCARD & REPLACE 07/05/21   Bedsole, Amy E, MD  escitalopram (LEXAPRO) 10 MG tablet Take 10 mg by mouth daily. 09/09/23   [provider]  Glucagon, rDNA, (GLUCAGON EMERGENCY) 1 MG KIT Inject 1 mg into the skin See admin instructions. Inject one mg into the skin see administration instructions. Follow package directions for low blood sugar. 06/18/23   [provider]  HYDROcodone-acetaminophen (NORCO/VICODIN) 5-325 MG tablet  Take 1 tablet by mouth every 4 (four) hours as needed for moderate pain. 09/21/23   Gilda Crease, MD  hyoscyamine (LEVSIN/SL) 0.125 MG SL tablet Place 1 tablet (0.125 mg total) under the tongue every 4 (four) hours as needed. 09/21/23   Gilda Crease, MD  levothyroxine (SYNTHROID) 137 MCG tablet Take 137 mcg by mouth daily before breakfast. 03/19/22   [provider]  metoCLOPramide (REGLAN) 5 MG tablet Take 1 tablet (5 mg total) by mouth 3 (three) times daily before meals. 02/06/23   Albertine Grates, MD  midodrine (PROAMATINE) 5 MG tablet Take 1 tablet (5 mg total) by mouth 2 (two) times daily with a meal. Hold if your systolic blood pressure  ( top number) is > , please check your blood pressure at home once a day and bring in record for your doctor to review, follow up with your pcp and cardiology regarding your blood pressure medication Patient taking differently: Take 5 mg by mouth See admin instructions. Take 5 mg by mouth in the morning and hold if the Systolic  number is greater than 150 02/06/23   Albertine Grates, MD  NOVOLOG FLEXPEN 100 UNIT/ML FlexPen Inject 4 Units into the skin See admin instructions. Inject 4 units into the skin three times a day after meals, PLUS sliding scale coverage    [provider]  omega-3 acid ethyl esters (LOVAZA) 1 g capsule Take 2 capsules (2 g total) by mouth 2 (two) times daily. 09/13/23   Alwyn Ren, MD  ondansetron (ZOFRAN) 4 MG tablet Take 1 tablet (4 mg total) by mouth every 6 (six) hours as needed for nausea. 09/13/23   Alwyn Ren, MD  pantoprazole (PROTONIX) 40 MG tablet Take 1 tablet (40 mg total) by mouth 2 (two) times daily. 09/21/23 10/21/23  Gilda Crease, MD  polyethylene glycol (MIRALAX) 17 g packet Take 17 g by mouth daily. 09/14/23   Alwyn Ren, MD  QUEtiapine (SEROQUEL) 50 MG tablet Take 50 mg by mouth at bedtime. 09/09/23   [provider]  ranolazine (RANEXA) 500 MG 12 hr  tablet Take 500 mg by mouth 2 (two) times daily. 07/23/23   [provider]  rosuvastatin (CRESTOR) 10 MG tablet Take 10 mg by mouth at bedtime. 06/17/23   [provider]  sucralfate (CARAFATE) 1 g tablet Take 1 tablet (1 g total) by mouth 4 (four) times daily -  with meals and at bedtime. 09/21/23   Gilda Crease, MD  tamsulosin (FLOMAX) 0.4 MG CAPS capsule Take 1 capsule (0.4 mg total) by mouth daily after supper. 09/04/23   Arnetha Courser, MD  TOUJEO SOLOSTAR 300 UNIT/ML Solostar Pen Inject 12 Units into the skin at bedtime. 08/24/23   Lewie Chamber, MD    Physical Exam: BP 100/79   Pulse 81   Temp 98.2 F (36.8 C) (Oral)   Resp 16   Ht 4\' 11"  (1.499 m)   Wt 56.2 kg   LMP 08/11/2012   SpO2 100%   BMI 25.02 kg/m   General:  Alert, oriented, calm, in no acute distress, looks vaguely uncomfortable.  Looks nontoxic. Eyes: EOMI, clear conjuctivae, white sclerea Neck: supple, no masses, trachea mildline  Cardiovascular: RRR, no murmurs or rubs, no peripheral edema  Respiratory: clear to auscultation bilaterally, no wheezes, no crackles  Abdomen: soft, tender on examination with voluntary guarding, nondistended, normal bowel tones heard  Skin: dry, no rashes  Musculoskeletal: no joint effusions, normal range of motion  Psychiatric: appropriate affect, normal speech  Neurologic: extraocular muscles intact, clear speech, moving all extremities with intact sensorium         Labs on Admission:  Basic Metabolic Panel: Recent Labs  Lab 09/21/23 0245 09/22/23 0133 09/22/23 0643  NA 128* 133* 131*  K 3.2* 3.2* 3.1*  CL 80* 86* 92*  CO2 28 27 24   GLUCOSE 429* 340* 254*  BUN 23* 27* 24*  CREATININE 0.76 0.82 0.64  CALCIUM 8.7* 8.8* 7.8*   Liver Function Tests: Recent Labs  Lab 09/21/23 0245 09/22/23 0133 09/22/23 0643  AST 15 20 16   ALT 19 19 14   ALKPHOS 81 76 66  BILITOT 1.4* 1.1 1.0  PROT 7.9 7.6 6.9  ALBUMIN 4.3 4.2 3.7   Recent Labs  Lab  09/21/23 0245 09/22/23 0133  LIPASE 128* 25   No results for input(s): "AMMONIA" in the last 168 hours. CBC: Recent Labs  Lab 09/21/23 0245 09/22/23 0133  WBC 14.4* 16.4*  NEUTROABS 11.5* 13.1*  HGB 14.1 14.4  HCT 40.9 43.7  MCV 85.4 88.1  PLT 477* 493*   Cardiac Enzymes: No results for input(s): "CKTOTAL", "CKMB", "CKMBINDEX", "TROPONINI" in the last 168 hours.  BNP (last 3 results) No results for input(s): "BNP" in the last 8760 hours.  ProBNP (last 3 results) No results for input(s): "PROBNP" in the last 8760 hours.  CBG: Recent Labs  Lab 09/21/23 0224 09/21/23 0523 09/22/23 0843  GLUCAP 432* 305* 244*    Radiological Exams on Admission: CT ABDOMEN PELVIS W CONTRAST  Result Date: 09/22/2023 CLINICAL DATA:  Epigastric pain, nausea EXAM: CT ABDOMEN AND PELVIS WITH CONTRAST TECHNIQUE: Multidetector CT imaging of the abdomen and pelvis was performed using the standard protocol following bolus administration of intravenous contrast. RADIATION DOSE REDUCTION: This exam was performed according to the departmental dose-optimization program which includes automated exposure control, adjustment of the mA and/or kV according to patient size and/or use of iterative reconstruction technique. CONTRAST:  OMNIPAQUE IOHEXOL 300 MG/ML  SOLN COMPARISON:  09/02/2023 FINDINGS: Lower chest: Circumferential wall thickening in the distal esophagus suggesting esophagitis. Hepatobiliary: No focal hepatic abnormality. Gallbladder unremarkable. Pancreas: No focal abnormality or ductal dilatation. Spleen: No focal abnormality.  Normal size. Adrenals/Urinary Tract: No adrenal abnormality. No focal renal abnormality. No stones or hydronephrosis. Urinary bladder is unremarkable. Stomach/Bowel: Scattered colonic diverticulosis. No active diverticulitis. Normal appendix. Stomach and small bowel unremarkable. Vascular/Lymphatic: Scattered aortic calcifications. No evidence of aneurysm or adenopathy.  Reproductive: Prior hysterectomy.  No adnexal masses. Other: No free fluid or free air. Musculoskeletal: No acute bony abnormality. IMPRESSION: Circumferential wall thickening in the visualized distal esophagus suggesting esophagitis. Colonic diverticulosis.  No active diverticulitis. Scattered aortic atherosclerosis. Electronically Signed   By: Charlett Nose M.D.   On: 09/22/2023 03:58    Assessment/Plan Vinia LE INGS is a 62 y.o. female with medical history significant for poorly controlled type 1 diabetes, history of DKA, recent pancreatitis, hypertension, hypothyroidism, CKD stage II being admitted to the hospital with severe abdominal pain and concern for esophagitis.   Esophagitis-concern due to history of GERD, increased upper abdominal pain and nausea.  Recent normal lipase, and otherwise unremarkable CT of the abdomen and pelvis today.  Was given GI cocktail in the ER with minimal improvement. -Observation admission -P.o. diet as tolerated -IV PPI -Avoid IV narcotics as patient is not vomiting, continue home Norco  Poorly controlled type 1 diabetes-recent hemoglobin A1c greater than 15% in September 2024.Marland Kitchen  Elevated beta hydroxybutyrate, and initial anion gap, but this has resolved.  Currently no evidence of DKA will discontinue insulin drip. -Carb controlled diet -Continue LR infusion for the next 10 hours, to avoid dehydration -Continue home long and short acting insulin with sliding scale  Hypokalemia-unclear etiology as patient denies vomiting or diarrhea, will replete IV due to nausea.  Recheck with morning labs.  Hypothyroidism-Synthroid  Hyperlipidemia-Crestor  Documented CKD stage II-renal function appears normal currently  DVT prophylaxis: Lovenox     Code Status: Full Code  Consults called: None  Admission status: Observation   Time spent: 49 minutes  Teliah Buffalo Sharlette Dense MD [facility] Pager [phone]  If 7PM-7AM, please contact  night-coverage [email or web address] Password [facility]  09/22/2023, 9:23 AM

## 2023-09-23 DIAGNOSIS — K209 Esophagitis, unspecified without bleeding: Secondary | ICD-10-CM

## 2023-09-23 DIAGNOSIS — R933 Abnormal findings on diagnostic imaging of other parts of digestive tract: Secondary | ICD-10-CM

## 2023-09-23 DIAGNOSIS — R1012 Left upper quadrant pain: Secondary | ICD-10-CM

## 2023-09-23 DIAGNOSIS — R1011 Right upper quadrant pain: Secondary | ICD-10-CM | POA: Diagnosis not present

## 2023-09-23 DIAGNOSIS — R110 Nausea: Secondary | ICD-10-CM | POA: Diagnosis not present

## 2023-09-23 DIAGNOSIS — R1114 Bilious vomiting: Secondary | ICD-10-CM

## 2023-09-23 LAB — GLUCOSE, CAPILLARY
Glucose-Capillary: 116 mg/dL — ABNORMAL HIGH (ref 70–99)
Glucose-Capillary: 51 mg/dL — ABNORMAL LOW (ref 70–99)
Glucose-Capillary: 185 mg/dL — ABNORMAL HIGH (ref 70–99)
Glucose-Capillary: 176 mg/dL — ABNORMAL HIGH (ref 70–99)
Glucose-Capillary: 61 mg/dL — ABNORMAL LOW (ref 70–99)
Glucose-Capillary: 58 mg/dL — ABNORMAL LOW (ref 70–99)
Glucose-Capillary: 90 mg/dL (ref 70–99)

## 2023-09-23 LAB — BASIC METABOLIC PANEL
Anion gap: 9 (ref 5–15)
BUN: 12 mg/dL (ref 6–20)
CO2: 23 mmol/L (ref 22–32)
Calcium: 7.8 mg/dL — ABNORMAL LOW (ref 8.9–10.3)
Chloride: 103 mmol/L (ref 98–111)
Creatinine, Ser: 0.52 mg/dL (ref 0.44–1.00)
GFR, Estimated: >60 mL/min (ref >60)
Glucose, Bld: 125 mg/dL — ABNORMAL HIGH (ref 70–99)
Potassium: 3.5 mmol/L (ref 3.5–5.1)
Sodium: 135 mmol/L (ref 135–145)

## 2023-09-23 LAB — CBC
HCT: 38.6 % (ref 36.0–46.0)
Hemoglobin: 12.8 g/dL (ref 12.0–15.0)
MCH: 29.4 pg (ref 26.0–34.0)
MCHC: 33.2 g/dL (ref 30.0–36.0)
MCV: 88.7 fL (ref 80.0–100.0)
Platelets: 321 10*3/uL (ref 150–400)
RBC: 4.35 MIL/uL (ref 3.87–5.11)
RDW: 14.3 % (ref 11.5–15.5)
WBC: 8.4 10*3/uL (ref 4.0–10.5)
nRBC: 0 % (ref 0.0–0.2)

## 2023-09-23 MED ORDER — PANTOPRAZOLE SODIUM 40 MG IV SOLR
40.0000 mg | Freq: Two times a day (BID) | INTRAVENOUS | Status: DC
  Administered 2023-09-23 – 2023-09-25 (×4): 40 mg via INTRAVENOUS
  Filled 2023-09-23 (×4): qty 10

## 2023-09-23 MED ORDER — LACTATED RINGERS IV BOLUS
1000.0000 mL | Freq: Once | INTRAVENOUS | Status: AC
  Administered 2023-09-23: 1000 mL via INTRAVENOUS

## 2023-09-23 MED ORDER — OMEGA-3-ACID ETHYL ESTERS 1 G PO CAPS
2.0000 g | ORAL_CAPSULE | Freq: Two times a day (BID) | ORAL | Status: DC
  Administered 2023-09-23 – 2023-09-25 (×5): 2 g via ORAL
  Filled 2023-09-23 (×5): qty 2

## 2023-09-23 NOTE — TOC CM/SW Note (Signed)
Transition of Care [facility]) - Inpatient Brief Assessment   Patient Details  Name: SHOLANDA CROSON MRN: [id number] Date of Birth: 1961-10-27  Transition of Care [facility]) CM/SW Contact:    Otelia Santee, LCSW Phone Number: 09/23/2023, 12:39 PM   Clinical Narrative: No TOC needs identified. Please consult TOC should need arise.  Transition of Care Asessment: Insurance and Status: Insurance coverage has been reviewed Patient has primary care physician: Yes Home environment has been reviewed: Single family home Prior level of function:: Independent Prior/Current Home Services: No current home services Social Determinants of Health Reivew: SDOH reviewed no interventions necessary Readmission risk has been reviewed: Yes Transition of care needs: no transition of care needs at this time

## 2023-09-23 NOTE — Progress Notes (Signed)
   09/23/23 0153  Assess: MEWS Score  Temp 98 F (36.7 C)  BP (!) 76/52  MAP (mmHg) (!) 58  Pulse Rate 76  Level of Consciousness Alert  SpO2 (!) 75 %  Assess: MEWS Score  MEWS Temp 0  MEWS Systolic 2  MEWS Pulse 0  MEWS RR 0  MEWS LOC 0  MEWS Score 2  MEWS Score Color Yellow  Assess: if the MEWS score is Yellow or Red  Were vital signs accurate and taken at a resting state? Yes  Does the patient meet 2 or more of the SIRS criteria? No  MEWS guidelines implemented  Yes, yellow  Treat  MEWS Interventions Considered administering scheduled or prn medications/treatments as ordered  Take Vital Signs  Increase Vital Sign Frequency  Yellow: Q2hr x1, continue Q4hrs until patient remains green for 12hrs  Escalate  MEWS: Escalate Yellow: Discuss with charge nurse and consider notifying provider and/or RRT  Notify: Charge Nurse/RN  Name of Charge Nurse/RN Notified Calvin RN  Provider Notification  Provider Name/Title [place] MD  Date Provider Notified 09/23/23  Time Provider Notified 0157  Method of Notification Page  Notification Reason Other (Comment)  Assess: SIRS CRITERIA  SIRS Temperature  0  SIRS Pulse 0  SIRS Respirations  0  SIRS WBC 0  SIRS Score Sum  0   Patient BP's = 72/58. Alert and oriented x 4. No distress symptoms. Patient had midodrine daily as MD ordered. Paged MD on call and waiting for new orders. Yellow MEWS score is initiated.

## 2023-09-23 NOTE — Plan of Care (Signed)
  Problem: Health Behavior/Discharge Planning: Goal: Ability to identify and utilize available resources and services will improve Outcome: Progressing   Problem: Health Behavior/Discharge Planning: Goal: Ability to manage health-related needs will improve Outcome: Progressing   Problem: Nutritional: Goal: Progress toward achieving an optimal weight will improve Outcome: Progressing   Problem: Skin Integrity: Goal: Risk for impaired skin integrity will decrease Outcome: Progressing   Problem: Education: Goal: Knowledge of General Education information will improve Description: Including pain rating scale, medication(s)/side effects and non-pharmacologic comfort measures Outcome: Progressing

## 2023-09-23 NOTE — Consult Note (Addendum)
Consultation  Primary Care Physician:  Everardo All, NP Primary Gastroenterologist:  Dr. Christella Hartigan       Reason for Consultation: intractable N/V/epigastric pain  DOA: 09/22/2023         Hospital Day: 2         HPI:   [name] [name] is a 62 y.o. female with past medical history significant for uncontrolled DM type 1, history of DKA, recent acute pancreatitis ([facility] ER visits for abdominal pain, nausea and vomiting), hypertensions, hypothyroidism, and CKD stage II admitted to the hospital with severe abdominal pain.   Work up notable for : Lipase 25, down from 390 on 10/3.  WBC 16.4?  Normal LFTs.  Hemoglobin 12.8.  Plts 321.  Kidney function normal.  Initially came in with high blood pressure. Elevated beta hydroxybutyrate and initial anion gap of 20.  Concerns for the risk of developing DKA, patient started on IV insulin drip.  10/13 CT scan abd/pelvis shows no focal abnormality or ductal dilatation and pancreas.  Gallbladder unremarkable.  Circumferential wall thickening in the distal esophagus suggesting esophagitis. Multiple CT scans 2024 showing hepatic steatosis and nodular enhancement of the gallbladder likely reflecting adenomyomatosis.  No biliary ductal dilatation.  He is unremarkable diverticulosis without diverticulitis.  Was also seen on 10/12 and 10/13 with complaints of severe epigastric abdominal pain.  No CT scans were ordered because she has had repeat imaging delay for the past 5 months.  Over 9 CT scans on file for this year.  10/3 Patient recent in ED with c/o abd pain with nausea and vomiting with possible pancreatitis.  Right upper quadrant abdominal ultrasound ordered and negative for gallstones and negative for findings of acute cholecystitis. Lipase 390. Triglycerides were 301.  He was treated with IV fluids, pain control, and bowel rest.  Patient discharged on 10/5.  Imaging ( see full report below)  09/23/2023 CT abdomen/pelvis with contrast 09/02/2023  [place] ABD RUQ Multiple CT scans on file for 2024  Patient lying in bed, (husband listening on phone), reports coming to the ED for worsening chronic epigastric abdominal pain for the past month that radiates to up her chest and back along with nausea and poor oral intake. She has history of GERD with esophagitis, but admits she has not been taking any home medications for this.Reports daily pyrosis and regurgitation. She has been prescribed Pantoprazole and Sucralfate in the past which she states helps. She has also been  prescribed Reglan but reports this does not help and she has not been taking. Reports she has no prescriptions for these meds. She is taking ondansetron and phenergan at home for nausea which helps. She reports she has not been eating much out of fear the pain will return. She reports position lying on side also helps alleviate the pain. No NSAID use. Last BM was a few days ago. Reports she usually only goes twice per week. No blood per rectum. No hematemesis. No alcohol or tobacco use. Smokes mariajuana daily, patient states " I take it for my PTSD". Reports she recently was started on trazodone and Seroquel in the last few weeks to hopefully wean off. No known family hx of CA.   Previous GI workup:   09/23/2023 CT abdomen/pelvis with contrast IMPRESSION: Circumferential wall thickening in the visualized distal esophagus suggesting esophagitis.   Colonic diverticulosis.  No active diverticulitis.   Scattered aortic atherosclerosis.  09/02/2023 [place] ABD RUQ  IMPRESSION: 1. Negative for gallstones. Negative for  findings of acute cholecystitis. 2. Mild intrahepatic and extrahepatic biliary prominence. Laboratory and clinical correlation advised.  02/05/2023 EGD with Dr. Russella Dar for complaints of nausea vomiting, hematemesis, abnormal CT of the GI tract.  Impression: - LA Grade C reflux esophagitis with no bleeding. - Erythematous mucosa in the antrum. Biopsied. - Erythematous  duodenopathy.  A. STOMACH, BIOPSY:  - ANTRAL MUCOSA WITH FOCAL REACTIVE/REPARATIVE CHANGES AND RARE,  ASSOCIATED ACUTE INFLAMMATION  - ANTRAL AND OXYNTIC MUCOSA WITH SLIGHT CHRONIC INFLAMMATION  - NEGATIVE FOR INTESTINAL METAPLASIA, DYSPLASIA OR MALIGNANCY  - SEE COMMENT   08/17/2020 with Dr Orvan Falconer for Hematemesis, Abnormal CT of the GI tract - distal esophageal thickening  Impression: - LA Grade D esophagitis with no bleeding. Biopsied. The likely souce for bleeding and CT scan findings. - Erosive gastropathy with stigmata of recent bleeding. Biopsied. - Mild erythematous duodenopathy.   FINAL MICROSCOPIC DIAGNOSIS:   A. STOMACH, RANDOM, BIOPSY:  - Mild reactive gastropathy with focal intestinal metaplasia.  - Warthin-Starry is negative for Helicobacter pylori.  - No dysplasia, or malignancy.   B. ESOPHAGUS, DISTAL, BIOPSY:  - Acute esophagitis with ulcer debris, see comment.   COMMENT:   B. The tissue is very scant and insufficient for performing a PAS stain  to assess for fungal elements.   06/03/2019 Small bowel endoscopy with Dr. Chales Abrahams for Abnormal CT of the GI tract showing thickening of the esophagus, duodenum and jejunum.  Impression: - LA Grade D esophagitis. Biopsied. - Normal stomach. Biopsied. - Non- bleeding duodenal ulcers with no stigmata of bleeding. Biopsied. - The examined portion of the jejunum was normal.  05/25/2019 colonoscopy with Dr. Christella Hartigan for screening  Findings: - Multiple small and large- mouthed diverticula were found in the entire colon. No polyps or cancers. - The exam was otherwise without abnormality on direct and retroflexion views.  04/08/2017 NM gastric emptying study- normal  Past Medical History:  Diagnosis Date   Allergy    seasonal   Anxiety    B12 deficiency    Chest pain 04/05/2017   CHF (congestive heart failure) ([name])    Diabetes mellitus type 1, uncontrolled    "dr. 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) 10/29/2013   Leg cramping    "@ night" (09/18/2013)   Migraine    "once q couple months" (11/26/2018)   Osteoporosis    Peripheral neuropathy    Umbilical hernia    unrepaired (09/18/2013)    Surgical History:  She  has a past surgical history that includes Cesarean section (1989); Laparoscopic assisted vaginal hysterectomy (09/23/2012); Tubal ligation (Bilateral, 1992); LEFT HEART CATH AND CORONARY ANGIOGRAPHY (N/A, 02/11/2017); enteroscopy (N/A, 06/03/2019); biopsy (06/03/2019); Esophagogastroduodenoscopy (egd) with propofol (N/A, 08/17/2020); biopsy (08/17/2020); Esophagogastroduodenoscopy (egd) with propofol (N/A, 02/05/2023); and biopsy (02/05/2023). Family History:  Her family history includes Alzheimer's disease in her maternal grandmother; Coronary artery disease in her maternal grandmother; Diabetes in her brother, maternal grandmother, and another family member; Heart attack in her maternal grandfather; Heart disease in an other family member; Heart failure in her brother; Heart failure (age of onset: 61) in her mother. Social History:   reports that she quit smoking about 32 years ago. Her smoking use included cigarettes. She started  smoking about 42 years ago. She has a 1.2 pack-year smoking history. She has never used smokeless tobacco. She reports that she does not currently use drugs after having used the following drugs: Marijuana. She reports that she does not drink alcohol.  Prior to Admission medications   Medication Sig Start Date End Date Taking? Authorizing Provider  aspirin EC 81 MG tablet Take 81 mg by mouth daily. Swallow whole.   Yes [provider]  escitalopram (LEXAPRO) 10 MG tablet Take 10 mg by mouth daily. 09/09/23  Yes  [provider]  Glucagon, rDNA, (GLUCAGON EMERGENCY) 1 MG KIT Inject 1 mg into the skin See admin instructions. Inject one mg into the skin see administration instructions. Follow package directions for low blood sugar. 06/18/23  Yes [provider]  levothyroxine (SYNTHROID) 137 MCG tablet Take 137 mcg by mouth daily before breakfast. 03/19/22  Yes [provider]  metoCLOPramide (REGLAN) 5 MG tablet Take 1 tablet (5 mg total) by mouth 3 (three) times daily before meals. Patient taking differently: Take 5 mg by mouth as needed for nausea or vomiting. 02/06/23  Yes Albertine Grates, MD  midodrine (PROAMATINE) 5 MG tablet Take 1 tablet (5 mg total) by mouth 2 (two) times daily with a meal. Hold if your systolic blood pressure  ( top number) is > , please check your blood pressure at home once a day and bring in record for your doctor to review, follow up with your pcp and cardiology regarding your blood pressure medication Patient taking differently: Take 5 mg by mouth daily. 02/06/23  Yes Albertine Grates, MD  NOVOLOG FLEXPEN 100 UNIT/ML FlexPen Inject 4 Units into the skin 3 (three) times daily with meals.   Yes [provider]  omega-3 acid ethyl esters (LOVAZA) 1 g capsule Take 2 capsules (2 g total) by mouth 2 (two) times daily. 09/13/23  Yes Alwyn Ren, MD  ondansetron (ZOFRAN) 4 MG tablet Take 1 tablet (4 mg total) by mouth every 6 (six) hours as needed for nausea. 09/13/23  Yes Alwyn Ren, MD  pantoprazole (PROTONIX) 40 MG tablet Take 1 tablet (40 mg total) by mouth 2 (two) times daily. Patient taking differently: Take 40 mg by mouth daily. 09/21/23 10/21/23 Yes Pollina, Canary Brim, MD  QUEtiapine (SEROQUEL) 50 MG tablet Take 50 mg by mouth daily. 09/09/23  Yes [provider]  ranolazine (RANEXA) 500 MG 12 hr tablet Take 500 mg by mouth 2 (two) times daily. 07/23/23  Yes [provider]  rosuvastatin (CRESTOR) 10 MG tablet Take 10 mg  by mouth daily. 06/17/23  Yes [provider]  tamsulosin (FLOMAX) 0.4 MG CAPS capsule Take 1 capsule (0.4 mg total) by mouth daily after supper. 09/04/23  Yes Arnetha Courser, MD  TOUJEO SOLOSTAR 300 UNIT/ML Solostar Pen Inject 12 Units into the skin at bedtime. Patient taking differently: Inject 8 Units into the skin at bedtime. 08/24/23  Yes Lewie Chamber, MD  traZODone (DESYREL) 50 MG tablet Take 50 mg by mouth at bedtime.   Yes [provider]  Continuous Blood Gluc Sensor (DEXCOM G6 SENSOR) MISC INJECT 1 SENSOR INTO SKIN EVERY 10 DAYS Patient taking differently: Inject 1 Device into the skin See admin instructions. Place 1 new device into the skin every 10 days 07/18/22   Excell Seltzer, MD  Continuous Blood Gluc Transmit (DEXCOM G6 TRANSMITTER) MISC USE AS DIRECTED FOR CONTINUOUS GLUCOSE MONITORING. REUSE TRANSMITTER X 90DAYS THEN DISCARD & REPLACE 07/05/21   Bedsole,  Amy E, MD  HYDROcodone-acetaminophen (NORCO/VICODIN) 5-325 MG tablet Take 1 tablet by mouth every 4 (four) hours as needed for moderate pain. Patient not taking: Reported on 09/22/2023 09/21/23   Gilda Crease, MD  hyoscyamine (LEVSIN/SL) 0.125 MG SL tablet Place 1 tablet (0.125 mg total) under the tongue every 4 (four) hours as needed. Patient not taking: Reported on 09/22/2023 09/21/23   Gilda Crease, MD  sucralfate (CARAFATE) 1 g tablet Take 1 tablet (1 g total) by mouth 4 (four) times daily -  with meals and at bedtime. Patient not taking: Reported on 09/22/2023 09/21/23   Gilda Crease, MD    Current Facility-Administered Medications  Medication Dose Route Frequency Provider Last Rate Last Admin   acetaminophen (TYLENOL) tablet 650 mg  650 mg Oral Q6H PRN Kirby Crigler, Mir M, MD       Or   acetaminophen (TYLENOL) suppository 650 mg  650 mg Rectal Q6H PRN Kirby Crigler, Mir M, MD       albuterol (PROVENTIL) (2.5 MG/3ML) 0.083% nebulizer solution 2.5 mg  2.5 mg Nebulization Q2H PRN  Kirby Crigler, Mir M, MD       aspirin EC tablet 81 mg  81 mg Oral Daily Kirby Crigler, Mir M, MD   81 mg at 09/23/23 0943   dextrose 50 % solution 0-50 mL  0-50 mL Intravenous PRN Elpidio Anis, PA-C       enoxaparin (LOVENOX) injection 40 mg  40 mg Subcutaneous Q24H Kirby Crigler, Mir M, MD   40 mg at 09/23/23 0943   escitalopram (LEXAPRO) tablet 10 mg  10 mg Oral Daily Kirby Crigler, Mir M, MD   10 mg at 09/23/23 0943   HYDROcodone-acetaminophen (NORCO/VICODIN) 5-325 MG per tablet 1 tablet  1 tablet Oral Q4H PRN Maryln Gottron, MD   1 tablet at 09/23/23 0956   insulin aspart (novoLOG) injection 0-15 Units  0-15 Units Subcutaneous TID WC Kirby Crigler Mir M, MD   2 Units at 09/22/23 1624   insulin aspart (novoLOG) injection 0-5 Units  0-5 Units Subcutaneous QHS Kirby Crigler, Mir M, MD       insulin aspart (novoLOG) injection 4 Units  4 Units Subcutaneous TID WC Kirby Crigler, Mir M, MD   4 Units at 09/23/23 0900   insulin glargine-yfgn [facility]) injection 6 Units  6 Units Subcutaneous QHS Maryln Gottron, MD   6 Units at 09/22/23 2204   levothyroxine (SYNTHROID) tablet 137 mcg  137 mcg Oral QAC breakfast Kirby Crigler, Mir M, MD   137 mcg at 09/23/23 0527   midodrine (PROAMATINE) tablet 5 mg  5 mg Oral Daily Kirby Crigler, Mir M, MD   5 mg at 09/23/23 0943   ondansetron (ZOFRAN) tablet 4 mg  4 mg Oral Q6H PRN Maryln Gottron, MD   4 mg at 09/22/23 1319   Or   ondansetron (ZOFRAN) injection 4 mg  4 mg Intravenous Q6H PRN Kirby Crigler, Mir M, MD   4 mg at 09/22/23 2348   pantoprazole (PROTONIX) injection 40 mg  40 mg Intravenous Q24H Kirby Crigler, Mir M, MD   40 mg at 09/23/23 0943   QUEtiapine (SEROQUEL) tablet 50 mg  50 mg Oral QHS Kirby Crigler, Mir M, MD   50 mg at 09/22/23 2127   ranolazine (RANEXA) 12 hr tablet 500 mg  500 mg Oral BID Kirby Crigler, Mir M, MD   500 mg at 09/23/23 0943   rosuvastatin (CRESTOR) tablet 10 mg  10 mg Oral QHS Kirby Crigler, Mir M, MD   10 mg at 09/22/23 2127  sucralfate (CARAFATE) tablet 1 g   1 g Oral TID WC & HS Kirby Crigler, Mir M, MD   1 g at 09/23/23 0943   tamsulosin (FLOMAX) capsule 0.4 mg  0.4 mg Oral QPC supper Kirby Crigler, Mir M, MD       traZODone (DESYREL) tablet 25 mg  25 mg Oral QHS PRN Kirby Crigler, Mir M, MD   25 mg at 09/22/23 2128    Allergies as of 09/22/2023 - Review Complete 09/22/2023  Allergen Reaction Noted   Codeine Hives, Anxiety, and Other (See Comments)     Review of Systems:    Constitutional: No weight loss, fever, chills, weakness or fatigue HEENT: Eyes: No change in vision               Ears, Nose, Throat:  No change in hearing or congestion Skin: No rash or itching Cardiovascular: No chest pain, chest pressure or palpitations   Respiratory: No SOB or cough Gastrointestinal: See HPI and otherwise negative Genitourinary: No dysuria or change in urinary frequency Neurological: No headache, dizziness or syncope Musculoskeletal: No new muscle or joint pain Hematologic: No bleeding or bruising Psychiatric: No history of depression or anxiety     Physical Exam:  Vital signs in last 24 hours: Temp:  [97.4 F (36.3 C)-98.4 F (36.9 C)] 97.9 F (36.6 C) (10/14 1224) Pulse Rate:  [76-98] 85 (10/14 1224) Resp:  [15-18] 18 (10/14 1224) BP: (74-159)/(46-96) 148/76 (10/14 1224) SpO2:  [95 %-100 %] 100 % (10/14 1224) Last BM Date : 09/22/23 Last BM recorded by nurses in past 5 days No data recorded  General:   Pleasant, well developed female in no acute distress Head:  Normocephalic and atraumatic Eyes: sclerae anicteric Heart:  regular rate and rhythm, no murmurs or gallops Pulm: Clear anteriorly; no wheezing Abdomen:  Soft, Obese AB, Hypoactive bowel sounds. moderate tenderness in the upper abdomen. Without guarding and Without rebound, No organomegaly appreciated. Extremities:  Without edema. Msk:  Symmetrical without gross deformities. Peripheral pulses intact.  Neurologic:  Alert and  oriented x4;  No focal deficits.  Skin:   Dry and intact  without significant lesions or rashes. Psychiatric:  Cooperative. Normal mood and affect.  LAB RESULTS: Recent Labs    09/21/23 0245 09/22/23 0133 09/23/23 0525  WBC 14.4* 16.4* 8.4  HGB 14.1 14.4 12.8  HCT 40.9 43.7 38.6  PLT 477* 493* 321   BMET Recent Labs    09/22/23 0133 09/22/23 0643 09/23/23 0525  NA 133* 131* 135  K 3.2* 3.1* 3.5  CL 86* 92* 103  CO2 27 24 23   GLUCOSE 340* 254* 125*  BUN 27* 24* 12  CREATININE 0.82 0.64 0.52  CALCIUM 8.8* 7.8* 7.8*   LFT Recent Labs    09/22/23 0643  PROT 6.9  ALBUMIN 3.7  AST 16  ALT 14  ALKPHOS 66  BILITOT 1.0   PT/INR No results for input(s): "LABPROT", "INR" in the last 72 hours.  STUDIES: CT ABDOMEN PELVIS W CONTRAST  Result Date: 09/22/2023 CLINICAL DATA:  Epigastric pain, nausea EXAM: CT ABDOMEN AND PELVIS WITH CONTRAST TECHNIQUE: Multidetector CT imaging of the abdomen and pelvis was performed using the standard protocol following bolus administration of intravenous contrast. RADIATION DOSE REDUCTION: This exam was performed according to the departmental dose-optimization program which includes automated exposure control, adjustment of the mA and/or kV according to patient size and/or use of iterative reconstruction technique. CONTRAST:  OMNIPAQUE IOHEXOL 300 MG/ML  SOLN COMPARISON:  09/02/2023 FINDINGS: Lower chest:  Circumferential wall thickening in the distal esophagus suggesting esophagitis. Hepatobiliary: No focal hepatic abnormality. Gallbladder unremarkable. Pancreas: No focal abnormality or ductal dilatation. Spleen: No focal abnormality.  Normal size. Adrenals/Urinary Tract: No adrenal abnormality. No focal renal abnormality. No stones or hydronephrosis. Urinary bladder is unremarkable. Stomach/Bowel: Scattered colonic diverticulosis. No active diverticulitis. Normal appendix. Stomach and small bowel unremarkable. Vascular/Lymphatic: Scattered aortic calcifications. No evidence of aneurysm or adenopathy.  Reproductive: Prior hysterectomy.  No adnexal masses. Other: No free fluid or free air. Musculoskeletal: No acute bony abnormality. IMPRESSION: Circumferential wall thickening in the visualized distal esophagus suggesting esophagitis. Colonic diverticulosis.  No active diverticulitis. Scattered aortic atherosclerosis. Electronically Signed   By: Charlett Nose M.D.   On: 09/22/2023 03:58      Impression /Plan:   61 year old female patient with history of chronic abdominal pain, nausea, and vomiting presents to ED with worsening abdominal pain along with high risk of developing DKA. Ha1c is 15.5. Poor compliance.    Chronic epigastric pain, nausea and vomiting. Presumed hx of diabetic gastroparesis? has been on Reglan- reports does not help. Gastric emptying scan 08/2017 normal.Has frequent episodes/hospitalizations for DKA. EGD 01/2023 revealed esophagitis, discharged on PPI BID and Reglan. Compliance on medications? 10/13 CT scan shows circumferential wall thickening in the distal esophagus suggesting esophagitis. 10/3 [place] RUQ - Mild intrahepatic and extrahepatic biliary prominence on ultrasound. No gallstones. Pancreas unremarkable. Lipase 25, down from 390 on 10/3.   BUN 12/Creat 0.52, Normal LFT's, Total Bili 1.0. WBC 8.4. -Changed Pantoprazole 40mg  IV from once day to BID -Clear liquid diet, NPO after MN - Schedule Endoscopic ultrasound tomorrow  -Needs better control of blood sugars, consider diabetes education inpt  Marijuana smoker (Nydia Ytuarte contribute to #1)  -smoking cessation, reports she takes for PTSD and has been started on Seroquel and Trazodone.  GERD hx of esophagitis. EGD during another admission 01/2023 revealed severe esophagitis. Biopsies suggested no H. pylori infection. Very poorly controlled diabetes. CT scan shows circumferential wall thickening in the distal esophagus suggesting esophagitis. Pt reports being off her medication Pantoprazole and Sucralfate for several months.  Reports  it does help with abd pain and nausea. -Changed Pantoprazole 40mg  IV from daily to BID -Clear liquid diet, NPO after MN - Schedule Endoscopic ultrasound tomorrow to evaluate distal bile duct and rule out microlithiasis  Insulin-dependent DM . A1c was 15.5% in September 2024.  -Educated pt on how elevated sugars can affect gut motility  Hypothyroidism- on synthroid  Stage II CKD - Avoid nephrotoxins.   Hypokalemia. Today 3.5 -replete per protocol.   Principal Problem:   Intractable nausea    LOS: 0 days    Thank you for your kind consultation, we will continue to follow.   Meleana Commerford J Cloie Wooden  09/23/2023, 12:49 PM

## 2023-09-23 NOTE — Plan of Care (Signed)

## 2023-09-23 NOTE — Progress Notes (Signed)
Hypoglycemic Event  CBG: 61  Treatment: 4 oz juice/soda  Symptoms: Pale  Follow-up CBG: Time:2125 CBG Result:58  4oz OJ given   CBG recheck 21:55 is 90 Pt is no longer sweaty and nauseous.   Possible Reasons for Event: Inadequate meal intake  Comments/MD notified:J Garner Nash via page Waiting return call. Pt hypoglycemia is resolved.    0000 CBG 365 LA 6 units given that was held at bedtime. Pt did have more OJ as it was her last time drinking before NPO.   0400 CBG 334 . Pt is NPO. On call provider notified of CBG. Orders to wait until the next CBG at 0800 for intervention. RN will assess for acute changes. Tish Frederickson

## 2023-09-23 NOTE — Progress Notes (Signed)
[facility] PROGRESS NOTE  [name] [name] (DOB: [birth year]) PPI:[id number] PCP: Everardo All, NP Outpatient Specialists: [facility]  Brief Narrative: [name] [name] is a 62 y.o. female with a history of poorly controlled T1DM (HbA1c >15.5%), pancreatitis, HTN, hypothyroidism who presented to the ED on 09/22/2023 with intractable nausea, vomiting, epigastric pain. She was hyperglycemic with leukocytosis. Imaging showed normal pancreas and circumferential esophageal thickening suggestive of esophagitis. She was given IVF, subcutaneous insulin, antiemetics, PPI, carafate, and admitted. GI is consulted.  Subjective: Hasn't had anything po yet, just ordered clears. +nausea, severe epigastric pain. Spouse on speaker phone during encounter.  Objective: BP (!) 148/76 (BP Location: Left Arm)   Pulse 85   Temp 97.9 F (36.6 C)   Resp 18   Ht 4\' 11"  (1.499 m)   Wt 56.2 kg   LMP 08/11/2012   SpO2 100%   BMI 25.02 kg/m   Gen: No distress Pulm: Clear, nonlabored  CV: RRR, no MRG GI: Soft, tender epigastrium without distention or rigidity, +BS Neuro: Alert and oriented. No new focal deficits. Ext: Warm, no deformities. Skin: No acute-appearing rashes, lesions or ulcers on visualized skin   Assessment & Plan: Epigastric pain, nausea, vomiting: Known grade C esophagitis based on EGD earlier this year, CT imaging suggestive of this as well. Does not have dysphagia or hematemesis at this time. There is a history of pancreatitis, though imaging is reassuring and lipase level is normal at 25.  - IV PPI - Add carafate - Start clear liquid, would not advance past a liquid diet today. May need IVF support with dextrose, will recheck after po challenge.  - I have consulted [facility], I appreciate their assistance in this case.   T1DM:  - Elevated BHB may be more due to fasting ketosis than pure insulin insufficiency. No acidosis or anion gap noted currently, so IV insulin not indicated.  -  Hypoglycemia currently due to getting meal coverage without actually eating anything. Will have to avoid mealtime insulin if she's not truly taking calories at that time. Otherwise, continue basal-bolus insulin.   Recent acute pancreatitis: Discharged 10/5. Not EtOH related and no gallstones. No acute findings on work up at this time.  - Continue lovaza.  - Pt/spouse request to discuss with GI the possibility of starting enzyme replacement therapy. The patient has 2 BMs per week on average. Defer to GI.   HLD:  - Continue rosuvastatin  Hypothyroidism: Last TSH was 26.970 in July.  - Continue synthroid. Clinically euthyroid, but will need recheck TFTs.    Stage II CKD: With slightly elevation of Cr on admission.  - Avoid nephrotoxins.   Hypokalemia: Resolved with supplementation.   Leukocytosis: Resolved without antimicrobial Tx.   Tyrone Nine, MD [facility] [email or web address] 09/23/2023, 12:52 PM

## 2023-09-23 NOTE — Inpatient Diabetes Management (Signed)
Inpatient Diabetes Program Recommendations  AACE/ADA: New Consensus Statement on Inpatient Glycemic Control (2015)  Target Ranges:  Prepandial:   less than 140 mg/dL      Peak postprandial:   less than 180 mg/dL (1-2 hours)      Critically ill patients:  140 - 180 mg/dL   Lab Results  Component Value Date   GLUCAP 185 (H) 09/23/2023   HGBA1C >15.5 (H) 08/22/2023    Review of Glycemic Control  Latest Reference Range & Units 09/22/23 16:19 09/22/23 21:52 09/23/23 07:32 09/23/23 12:25 09/23/23 13:55  Glucose-Capillary 70 - 99 mg/dL 621 (H) 308 (H) 657 (H) 51 (L) 185 (H)  Outpatient Diabetes medications:  Toujeo 8 units every day Novolog 4 units TID + SSI Current orders for Inpatient glycemic control:  Semglee 6 units daily Novolog 0-15 units tid with meals Novolog 4 units tid with meals  Inpatient Diabetes Program Recommendations:    Note low today at lunch.  Consider reducing Novolog meal coverage to 3 units and reduce Novolog correction to sensitive (0-9 units) tid with meals and HS.   Thanks,  Beryl Meager, RN, BC-ADM Inpatient Diabetes Coordinator Pager [phone]  (8a-5p)

## 2023-09-24 ENCOUNTER — Encounter (HOSPITAL_COMMUNITY): Payer: Self-pay | Admitting: Internal Medicine

## 2023-09-24 ENCOUNTER — Inpatient Hospital Stay (HOSPITAL_COMMUNITY): Payer: BC Managed Care – PPO | Admitting: Anesthesiology

## 2023-09-24 ENCOUNTER — Encounter (HOSPITAL_COMMUNITY)
Admission: EM | Disposition: A | Discharge status: Home / Self Care | Payer: Self-pay | Source: Home / Self Care | Attending: Family Medicine

## 2023-09-24 ENCOUNTER — Inpatient Hospital Stay (HOSPITAL_COMMUNITY): Payer: BC Managed Care – PPO

## 2023-09-24 DIAGNOSIS — E1022 Type 1 diabetes mellitus with diabetic chronic kidney disease: Secondary | ICD-10-CM | POA: Diagnosis present

## 2023-09-24 DIAGNOSIS — K838 Other specified diseases of biliary tract: Secondary | ICD-10-CM | POA: Diagnosis not present

## 2023-09-24 DIAGNOSIS — M810 Age-related osteoporosis without current pathological fracture: Secondary | ICD-10-CM | POA: Diagnosis present

## 2023-09-24 DIAGNOSIS — E860 Dehydration: Secondary | ICD-10-CM | POA: Diagnosis present

## 2023-09-24 DIAGNOSIS — K209 Esophagitis, unspecified without bleeding: Secondary | ICD-10-CM | POA: Diagnosis not present

## 2023-09-24 DIAGNOSIS — I480 Paroxysmal atrial fibrillation: Secondary | ICD-10-CM | POA: Diagnosis present

## 2023-09-24 DIAGNOSIS — K2980 Duodenitis without bleeding: Secondary | ICD-10-CM

## 2023-09-24 DIAGNOSIS — Z794 Long term (current) use of insulin: Secondary | ICD-10-CM | POA: Diagnosis not present

## 2023-09-24 DIAGNOSIS — K269 Duodenal ulcer, unspecified as acute or chronic, without hemorrhage or perforation: Secondary | ICD-10-CM

## 2023-09-24 DIAGNOSIS — E785 Hyperlipidemia, unspecified: Secondary | ICD-10-CM | POA: Diagnosis present

## 2023-09-24 DIAGNOSIS — K2289 Other specified disease of esophagus: Secondary | ICD-10-CM | POA: Diagnosis not present

## 2023-09-24 DIAGNOSIS — K2210 Ulcer of esophagus without bleeding: Secondary | ICD-10-CM | POA: Diagnosis present

## 2023-09-24 DIAGNOSIS — R1013 Epigastric pain: Secondary | ICD-10-CM | POA: Diagnosis not present

## 2023-09-24 DIAGNOSIS — J302 Other seasonal allergic rhinitis: Secondary | ICD-10-CM | POA: Diagnosis present

## 2023-09-24 DIAGNOSIS — K589 Irritable bowel syndrome without diarrhea: Secondary | ICD-10-CM | POA: Diagnosis present

## 2023-09-24 DIAGNOSIS — E8809 Other disorders of plasma-protein metabolism, not elsewhere classified: Secondary | ICD-10-CM | POA: Diagnosis present

## 2023-09-24 DIAGNOSIS — K229 Disease of esophagus, unspecified: Secondary | ICD-10-CM | POA: Diagnosis not present

## 2023-09-24 DIAGNOSIS — K3189 Other diseases of stomach and duodenum: Secondary | ICD-10-CM | POA: Diagnosis not present

## 2023-09-24 DIAGNOSIS — K2970 Gastritis, unspecified, without bleeding: Secondary | ICD-10-CM

## 2023-09-24 DIAGNOSIS — K3184 Gastroparesis: Secondary | ICD-10-CM | POA: Diagnosis present

## 2023-09-24 DIAGNOSIS — I10 Essential (primary) hypertension: Secondary | ICD-10-CM | POA: Diagnosis not present

## 2023-09-24 DIAGNOSIS — F17210 Nicotine dependence, cigarettes, uncomplicated: Secondary | ICD-10-CM | POA: Diagnosis present

## 2023-09-24 DIAGNOSIS — K859 Acute pancreatitis without necrosis or infection, unspecified: Secondary | ICD-10-CM | POA: Diagnosis not present

## 2023-09-24 DIAGNOSIS — K760 Fatty (change of) liver, not elsewhere classified: Secondary | ICD-10-CM | POA: Diagnosis present

## 2023-09-24 DIAGNOSIS — R1900 Intra-abdominal and pelvic swelling, mass and lump, unspecified site: Secondary | ICD-10-CM | POA: Diagnosis not present

## 2023-09-24 DIAGNOSIS — R110 Nausea: Secondary | ICD-10-CM | POA: Diagnosis not present

## 2023-09-24 DIAGNOSIS — K2100 Gastro-esophageal reflux disease with esophagitis, without bleeding: Secondary | ICD-10-CM | POA: Diagnosis present

## 2023-09-24 DIAGNOSIS — E871 Hypo-osmolality and hyponatremia: Secondary | ICD-10-CM | POA: Diagnosis present

## 2023-09-24 DIAGNOSIS — E1043 Type 1 diabetes mellitus with diabetic autonomic (poly)neuropathy: Secondary | ICD-10-CM | POA: Diagnosis present

## 2023-09-24 DIAGNOSIS — E039 Hypothyroidism, unspecified: Secondary | ICD-10-CM | POA: Diagnosis present

## 2023-09-24 DIAGNOSIS — F4310 Post-traumatic stress disorder, unspecified: Secondary | ICD-10-CM | POA: Diagnosis present

## 2023-09-24 DIAGNOSIS — K839 Disease of biliary tract, unspecified: Secondary | ICD-10-CM

## 2023-09-24 DIAGNOSIS — G8929 Other chronic pain: Secondary | ICD-10-CM | POA: Diagnosis present

## 2023-09-24 DIAGNOSIS — R739 Hyperglycemia, unspecified: Secondary | ICD-10-CM | POA: Diagnosis present

## 2023-09-24 HISTORY — PX: EUS: SHX5427

## 2023-09-24 HISTORY — PX: ESOPHAGOGASTRODUODENOSCOPY (EGD) WITH PROPOFOL: SHX5813

## 2023-09-24 HISTORY — PX: BIOPSY: SHX5522

## 2023-09-24 LAB — GLUCOSE, CAPILLARY
Glucose-Capillary: 156 mg/dL — ABNORMAL HIGH (ref 70–99)
Glucose-Capillary: 172 mg/dL — ABNORMAL HIGH (ref 70–99)
Glucose-Capillary: 174 mg/dL — ABNORMAL HIGH (ref 70–99)
Glucose-Capillary: 221 mg/dL — ABNORMAL HIGH (ref 70–99)
Glucose-Capillary: 334 mg/dL — ABNORMAL HIGH (ref 70–99)
Glucose-Capillary: 365 mg/dL — ABNORMAL HIGH (ref 70–99)
Glucose-Capillary: 67 mg/dL — ABNORMAL LOW (ref 70–99)

## 2023-09-24 LAB — CBC
HCT: 40.4 % (ref 36.0–46.0)
Hemoglobin: 13.3 g/dL (ref 12.0–15.0)
MCH: 28.9 pg (ref 26.0–34.0)
MCHC: 32.9 g/dL (ref 30.0–36.0)
MCV: 87.8 fL (ref 80.0–100.0)
Platelets: 339 10*3/uL (ref 150–400)
RBC: 4.6 MIL/uL (ref 3.87–5.11)
RDW: 14.2 % (ref 11.5–15.5)
WBC: 9.7 10*3/uL (ref 4.0–10.5)
nRBC: 0 % (ref 0.0–0.2)

## 2023-09-24 LAB — COMPREHENSIVE METABOLIC PANEL
ALT: 13 U/L (ref 0–44)
AST: 12 U/L — ABNORMAL LOW (ref 15–41)
Albumin: 3.3 g/dL — ABNORMAL LOW (ref 3.5–5.0)
Alkaline Phosphatase: 56 U/L (ref 38–126)
Anion gap: 8 (ref 5–15)
BUN: 14 mg/dL (ref 6–20)
CO2: 26 mmol/L (ref 22–32)
Calcium: 8.4 mg/dL — ABNORMAL LOW (ref 8.9–10.3)
Chloride: 94 mmol/L — ABNORMAL LOW (ref 98–111)
Creatinine, Ser: 0.63 mg/dL (ref 0.44–1.00)
GFR, Estimated: >60 mL/min (ref >60)
Glucose, Bld: 292 mg/dL — ABNORMAL HIGH (ref 70–99)
Potassium: 4.6 mmol/L (ref 3.5–5.1)
Sodium: 128 mmol/L — ABNORMAL LOW (ref 135–145)
Total Bilirubin: 0.7 mg/dL (ref 0.3–1.2)
Total Protein: 6.1 g/dL — ABNORMAL LOW (ref 6.5–8.1)

## 2023-09-24 LAB — PROTIME-INR
INR: 0.9 (ref 0.8–1.2)
Prothrombin Time: 12.5 s (ref 11.4–15.2)

## 2023-09-24 SURGERY — UPPER ENDOSCOPIC ULTRASOUND (EUS) LINEAR
Anesthesia: Monitor Anesthesia Care

## 2023-09-24 MED ORDER — LIDOCAINE HCL (CARDIAC) PF 100 MG/5ML IV SOSY
PREFILLED_SYRINGE | INTRAVENOUS | Status: DC | PRN
  Administered 2023-09-24: 100 mg via INTRAVENOUS

## 2023-09-24 MED ORDER — PHENYLEPHRINE 80 MCG/ML (10ML) SYRINGE FOR IV PUSH (FOR BLOOD PRESSURE SUPPORT)
PREFILLED_SYRINGE | INTRAVENOUS | Status: DC | PRN
Start: 2023-09-24 — End: 2023-09-24
  Administered 2023-09-24 (×2): 160 ug via INTRAVENOUS

## 2023-09-24 MED ORDER — SODIUM CHLORIDE 0.9 % IV SOLN: INTRAVENOUS | Status: DC

## 2023-09-24 MED ORDER — SODIUM CHLORIDE 0.9 % IV SOLN
INTRAVENOUS | Status: DC | PRN
Start: 2023-09-24 — End: 2023-09-24

## 2023-09-24 MED ORDER — PROPOFOL 500 MG/50ML IV EMUL
INTRAVENOUS | Status: AC
  Filled 2023-09-24: qty 50

## 2023-09-24 MED ORDER — INSULIN ASPART 100 UNIT/ML IJ SOLN
2.0000 [IU] | Freq: Three times a day (TID) | INTRAMUSCULAR | Status: DC
  Administered 2023-09-25: 2 [IU] via SUBCUTANEOUS

## 2023-09-24 MED ORDER — HYDROMORPHONE HCL 1 MG/ML IJ SOLN
1.0000 mg | INTRAMUSCULAR | Status: DC | PRN
  Administered 2023-09-24: 1 mg via INTRAVENOUS
  Filled 2023-09-24: qty 1

## 2023-09-24 MED ORDER — INSULIN ASPART 100 UNIT/ML IJ SOLN: 2.0000 [IU] | Freq: Once | INTRAMUSCULAR | Status: DC

## 2023-09-24 MED ORDER — PROPOFOL 10 MG/ML IV BOLUS
INTRAVENOUS | Status: DC | PRN
  Administered 2023-09-24: 350 mg via INTRAVENOUS

## 2023-09-24 MED ORDER — SUCRALFATE 1 GM/10ML PO SUSP
1.0000 g | Freq: Three times a day (TID) | ORAL | Status: DC
  Administered 2023-09-24 – 2023-09-25 (×5): 1 g via ORAL
  Filled 2023-09-24 (×5): qty 10

## 2023-09-24 MED ORDER — DEXTROSE 50 % IV SOLN
12.5000 g | INTRAVENOUS | Status: AC
  Administered 2023-09-24: 12.5 g via INTRAVENOUS
  Filled 2023-09-24: qty 50

## 2023-09-24 MED ORDER — GADOBUTROL 1 MMOL/ML IV SOLN
5.0000 mL | Freq: Once | INTRAVENOUS | Status: AC | PRN
  Administered 2023-09-24: 5 mL via INTRAVENOUS

## 2023-09-24 MED ORDER — LORAZEPAM 2 MG/ML IJ SOLN
0.5000 mg | Freq: Once | INTRAMUSCULAR | Status: AC | PRN
  Administered 2023-09-24: 0.5 mg via INTRAVENOUS
  Filled 2023-09-24: qty 1

## 2023-09-24 NOTE — Plan of Care (Signed)

## 2023-09-24 NOTE — Progress Notes (Signed)
Hypoglycemic Event  CBG: 67  Treatment: D50 25 mL (12.5 gm)  Symptoms: None  Follow-up CBG: Time:2033 CBG Result:172  Possible Reasons for Event: Other: Pt NPO  and prior diet full liquid w/poor PO intake  Comments/MD notified: Garner Nash NP notified    Kerrin Champagne

## 2023-09-24 NOTE — Plan of Care (Signed)
  Problem: Education: Goal: Ability to describe self-care measures that may prevent or decrease complications (Diabetes Survival Skills Education) will improve Outcome: Progressing   Problem: Coping: Goal: Ability to adjust to condition or change in health will improve Outcome: Progressing   Problem: Fluid Volume: Goal: Ability to maintain a balanced intake and output will improve Outcome: Progressing   Problem: Health Behavior/Discharge Planning: Goal: Ability to identify and utilize available resources and services will improve Outcome: Progressing   Problem: Metabolic: Goal: Ability to maintain appropriate glucose levels will improve Outcome: Progressing   Problem: Nutritional: Goal: Maintenance of adequate nutrition will improve Outcome: Progressing Goal: Progress toward achieving an optimal weight will improve Outcome: Progressing

## 2023-09-24 NOTE — Anesthesia Preprocedure Evaluation (Signed)
Anesthesia Evaluation  Patient identified by MRN, date of birth, ID band Patient awake    Reviewed: Allergy & Precautions, H&P , NPO status , Patient's Chart, lab work & pertinent test results  Airway Mallampati: II  TM Distance: >3 FB Neck ROM: Full    Dental no notable dental hx.    Pulmonary neg pulmonary ROS, former smoker   Pulmonary exam normal breath sounds clear to auscultation       Cardiovascular hypertension, Pt. on medications Normal cardiovascular exam Rhythm:Regular Rate:Normal     Neuro/Psych   Anxiety Depression    negative neurological ROS     GI/Hepatic Neg liver ROS,GERD  ,,  Endo/Other  diabetes, Poorly Controlled, Type 1, Insulin DependentHypothyroidism    Renal/GU negative Renal ROS  negative genitourinary   Musculoskeletal negative musculoskeletal ROS (+)    Abdominal   Peds negative pediatric ROS (+)  Hematology negative hematology ROS (+)   Anesthesia Other Findings   Reproductive/Obstetrics negative OB ROS                             Anesthesia Physical Anesthesia Plan  ASA: 3  Anesthesia Plan: MAC   Post-op Pain Management: Minimal or no pain anticipated   Induction: Intravenous  PONV Risk Score and Plan: 2 and Propofol infusion and Treatment may vary due to age or medical condition  Airway Management Planned: Simple Face Mask  Additional Equipment:   Intra-op Plan:   Post-operative Plan:   Informed Consent: I have reviewed the patients History and Physical, chart, labs and discussed the procedure including the risks, benefits and alternatives for the proposed anesthesia with the patient or authorized representative who has indicated his/her understanding and acceptance.     Dental advisory given  Plan Discussed with: CRNA and Surgeon  Anesthesia Plan Comments:        Anesthesia Quick Evaluation

## 2023-09-24 NOTE — Op Note (Signed)
[facility] Patient Name: [name] [name] Procedure Date: 09/24/2023 MRN: [id number] Attending MD: [name] , MD, [id number] Date of Birth: January 28, 1961 CSN: [id number] Age: 63 Admit Type: Inpatient Procedure:                Upper EUS Indications:              Acute pancreatitis, Generalized abdominal pain,                            Abnormal CT of the GI tract Providers:                [name], MD, Lorenza Evangelist, RN, Priscella Mann, Technician Referring MD:             Inpatient medical service Medicines:                Monitored Anesthesia Care Complications:            No immediate complications. Estimated Blood Loss:     Estimated blood loss was minimal. Procedure:                Pre-Anesthesia Assessment:                           - Prior to the procedure, a History and Physical                            was performed, and patient medications and                            allergies were reviewed. The patient's tolerance of                            previous anesthesia was also reviewed. The risks                            and benefits of the procedure and the sedation                            options and risks were discussed with the patient.                            All questions were answered, and informed consent                            was obtained. Prior Anticoagulants: The patient has                            taken no anticoagulant or antiplatelet agents. ASA                            Grade Assessment: III - A patient with severe  systemic disease. After reviewing the risks and                            benefits, the patient was deemed in satisfactory                            condition to undergo the procedure.                           After obtaining informed consent, the endoscope was                            passed under direct vision. Throughout the                             procedure, the patient's blood pressure, pulse, and                            oxygen saturations were monitored continuously. The                            [id number]-[id number] ([id number]) Olympus endoscope was introduced                            through the mouth, and advanced to the second part                            of duodenum. The [id number]-[id number] ([id number]) Olympus                            duodenoscope was introduced through the mouth, and                            advanced to the area of papilla. The [id number]                            ([id number]) Olympus linear ultrasound scope was                            introduced through the mouth, and advanced to the                            duodenum for ultrasound examination from the                            stomach and duodenum. The upper EUS was                            accomplished without difficulty. The patient                            tolerated the procedure. Scope In: Scope Out: Findings:      ENDOSCOPIC FINDING: :      No gross lesions were noted in the  proximal esophagus.      Severe LA Grade D (one or more mucosal breaks involving at least 75% of       esophageal circumference) esophagitis with no bleeding was found 28 to       39 cm from the incisors. Biopsies were taken with a cold forceps for       histology.      The Z-line was irregular and was found 39 cm from the incisors.      Patchy mildly erythematous mucosa without bleeding was found in the       entire examined stomach. Biopsies were taken with a cold forceps for       histology and Helicobacter pylori testing.      Few non-bleeding cratered duodenal ulcers were found in the second       portion of the duodenum. The largest lesion was 10 mm in largest       dimension.      Diffuse severe inflammation characterized by congestion (edema),       erosions, friability and granularity was found in the duodenal bulb, in       the first portion of the duodenum, in  the second portion of the duodenum       and in the third portion of the duodenum. Biopsies were taken with a       cold forceps for histology.      Congested mucosa without active bleeding was found at the major papilla.      ENDOSONOGRAPHIC FINDING: :      Pancreatic parenchymal abnormalities were noted in the pancreatic head,       genu of the pancreas, pancreatic body and pancreatic tail. These       consisted of hyperechoic strands.      The pancreatic duct had a tortuous/ectatic appearance in the pancreatic       head and genu of the pancreas. There is suggestion that the pancreatic       duct within the head has divisum. The pancreatic duct in the head       measured 3.2 -> 2.8 mm, the pancreatic duct in the neck measured 2.0 mm,       the pancreatic duct in the body measured 1.1 mm, the pancreatic duct in       the tail measured 0.7 mm.      There was dilation/prominence of the common bile duct (5.9 -> 7.1 mm)       and in the common hepatic duct (8.9 mm). No evidence of       choledocholithiasis was found.      Endosonographic imaging of the ampulla showed no intramural       (subepithelial) lesion.      Endosonographic imaging in the visualized portion of the liver showed no       mass.      No malignant-appearing lymph nodes were visualized in the celiac region       (level 20), peripancreatic region and porta hepatis region.      The celiac region was visualized. Impression:               EGD impression:                           - No gross lesions in the proximal esophagus.                           -  Severe LA Grade D erosive esophagitis with no                            bleeding was found from 28 to 39 cm. Biopsied.                           - Z-line irregular, 39 cm from the incisors.                           - Erythematous mucosa in the stomach. Biopsied.                           - Non-bleeding duodenal ulcers.                           - Duodenitis. Biopsied.                            - Congested major papilla.                           EUS impression:                           - Pancreatic parenchymal abnormalities consisting                            of hyperechoic strands were noted in the pancreatic                            head, genu of the pancreas, pancreatic body and                            pancreatic tail.                           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 The pancreatic duct itself was normal                            sized. There did not appear to be any evidence of                            any stenosis or strictures.                           - There was dilation/prominence in the common bile  duct and in the common hepatic duct. No evidence of                            choledocholithiasis.                           - No malignant-appearing lymph nodes were                            visualized in the celiac region (level 20),                            peripancreatic region and porta hepatis region. Moderate Sedation:      Not Applicable - Patient had care per Anesthesia. Recommendation:           - The patient will be observed post-procedure,                            until all discharge criteria are met.                           - Return patient to hospital ward for ongoing care.                           - Advance diet as tolerated.                           - Observe patient's clinical course.                           - Continue PPI twice daily.                           - Continue Carafate 4 times daily (make this a                            liquid to heal the esophagitis).                           - Recommend, if patient is willing, MRI/MRCP to                            better  define the pancreatic anatomy, though I                            suspect that the patient may have a complete                            pancreatic divisum. It is not clear to me that                            additional workup would be required even if she  does have pancreatic divisum, as she has not had                            multiple episodes of pancreatitis that we are aware                            of except 1 episode that had a very quick elevation                            in lipase and then subsequent decrease.                           - The findings on this patient's endoscopy are                            likely the etiology of her significant pain and                            discomfort. Although we are going to pursue the                            MRI/MRCP as an inpatient GIST to define pancreatic                            divisum anatomy, it is not clear that there is any                            additional inpatient GI workup that will be                            required.                           - The findings and recommendations were discussed                            with the patient.                           - The findings and recommendations were discussed                            with the referring physician. Procedure Code(s):        --- Professional ---                           [id number], Esophagogastroduodenoscopy, flexible,                            transoral; with endoscopic ultrasound examination                            limited to the esophagus, stomach or duodenum, and  adjacent structures                           43239, Esophagogastroduodenoscopy, flexible,                            transoral; with biopsy, single or multiple Diagnosis Code(s):        --- Professional ---                           K20.80, Other esophagitis without bleeding                           K22.89, Other  specified disease of esophagus                           K31.89, Other diseases of stomach and duodenum                           K26.9, Duodenal ulcer, unspecified as acute or                            chronic, without hemorrhage or perforation                           K29.80, Duodenitis without bleeding                           K86.9, Disease of pancreas, unspecified                           R93.3, Abnormal findings on diagnostic imaging of                            other parts of digestive tract                           I89.9, Noninfective disorder of lymphatic vessels                            and lymph nodes, unspecified                           K85.90, Acute pancreatitis without necrosis or                            infection, unspecified                           R10.84, Generalized abdominal pain                           K83.8, Other specified diseases of biliary tract CPT copyright 2022 [employer] Association. All rights reserved. The codes documented in this report are preliminary and upon coder review may  be revised to meet current compliance requirements. [name], MD 09/24/2023 2:41:41 PM Number of Addenda: 0

## 2023-09-24 NOTE — Anesthesia Postprocedure Evaluation (Signed)
Anesthesia Post Note  Patient: [name] [name]  Procedure(s) Performed: UPPER ENDOSCOPIC ULTRASOUND (EUS) LINEAR BIOPSY     Patient location during evaluation: PACU Anesthesia Type: MAC Level of consciousness: awake and alert Pain management: pain level controlled Vital Signs Assessment: post-procedure vital signs reviewed and stable Respiratory status: spontaneous breathing, nonlabored ventilation and respiratory function stable Cardiovascular status: blood pressure returned to baseline and stable Postop Assessment: no apparent nausea or vomiting Anesthetic complications: no   No notable events documented.  Last Vitals:  Vitals:   09/24/23 1430 09/24/23 1440  BP: 131/82 (!) 148/86  Pulse: 81 80  Resp: 13 13  Temp:    SpO2: 100% 100%    Last Pain:  Vitals:   09/24/23 1420  TempSrc:   PainSc: 0-No pain                 Lannie Fields

## 2023-09-24 NOTE — Progress Notes (Addendum)
[facility] PROGRESS NOTE  [name] [name] (DOB: 07-Nov-1961) FAO:[id number] PCP: Everardo All, NP Outpatient Specialists: [facility]  Brief Narrative: [name] [name] is a 63 y.o. female with a history of poorly controlled T1DM (HbA1c >15.5%), pancreatitis, HTN, hypothyroidism who presented to the ED on 09/22/2023 with intractable nausea, vomiting, epigastric pain. She was hyperglycemic with leukocytosis. Imaging showed normal pancreas and circumferential esophageal thickening suggestive of esophagitis. She was given IVF, subcutaneous insulin, antiemetics, PPI, carafate, and admitted. GI is consulted, EGD planned 10/15.  Subjective: Ready for procedure, no new complaints.   Objective: BP (!) 137/93 (BP Location: Left Arm)   Pulse 91   Temp 98.6 F (37 C) (Oral)   Resp 16   Ht 4\' 11"  (1.499 m)   Wt 56.2 kg   LMP 08/11/2012   SpO2 100%   BMI 25.02 kg/m   Gen: No distress Pulm: Clear, nonlabored  CV: RRR, no MRG or edema GI: Soft, NT, ND, +BS Neuro: Alert and oriented. No new focal deficits. Ext: Warm, no deformities Skin: No acute rashes, lesions or ulcers on visualized skin   Assessment & Plan: Epigastric pain, nausea, vomiting: Known grade C esophagitis based on EGD earlier this year, CT imaging suggestive of this as well. Does not have dysphagia or hematemesis at this time. There is a history of pancreatitis, though imaging is reassuring and lipase level is normal at 25.  - IV PPI, carafate - NPO for now, defer postprocedure diet to GI.   - I have consulted [facility], planning EGD/EUS   T1DM:  - Continue basal insulin while NPO, restart mealtime insulin once taking po at lower dose.   Hyponatremia: Exaggerated by hyperglycemia.  - Continue IVF while NPO, recheck in AM.   Recent acute pancreatitis: Discharged 10/5. Not EtOH related and no gallstones. No acute findings on work up at this time.  - Continue lovaza.  - Pt/spouse request to discuss with GI the  possibility of starting enzyme replacement therapy. The patient has 2 BMs per week on average. Defer to GI.   HLD:  - Continue rosuvastatin  Hypothyroidism: Last TSH was 26.970 in July.  - Continue synthroid. Clinically euthyroid, but will need recheck TFTs.    Stage II CKD: With slightly elevation of Cr on admission.  - Avoid nephrotoxins.   Hypokalemia: Resolved with supplementation.   Leukocytosis: Resolved without antimicrobial Tx.   Hypocalcemia: With hypoalbuminemia, Ca corrects to normal today.  Tyrone Nine, MD [facility] [email or web address] 09/24/2023, 9:45 AM

## 2023-09-24 NOTE — Transfer of Care (Signed)
Immediate Anesthesia Transfer of Care Note  Patient: [name] [name]  Procedure(s) Performed: UPPER ENDOSCOPIC ULTRASOUND (EUS) LINEAR BIOPSY  Patient Location: Endoscopy Unit  Anesthesia Type:MAC  Level of Consciousness: awake and alert   Airway & Oxygen Therapy: Patient Spontanous Breathing  Post-op Assessment: Report given to RN  Post vital signs: Reviewed and stable  Last Vitals:  Vitals Value Taken Time  BP 99/52   Temp    Pulse 76 09/24/23 1415  Resp 16 09/24/23 1415  SpO2 100 % 09/24/23 1415  Vitals shown include unfiled device data.  Last Pain:  Vitals:   09/24/23 1228  TempSrc: Tympanic  PainSc: 0-No pain      Patients Stated Pain Goal: 0 (09/23/23 0000)  Complications: No notable events documented.

## 2023-09-24 NOTE — Progress Notes (Signed)
Mobility Specialist - Progress Note   09/24/23 1002  Mobility  Activity Ambulated with assistance in hallway  Level of Assistance Minimal assist, patient does 75% or more  Assistive Device Other (Comment) (HHA)  Distance Ambulated (ft) 80 ft  Range of Motion/Exercises Active  Activity Response Tolerated fair  Mobility Referral Yes  $Mobility charge 1 Mobility  Mobility Specialist Start Time (ACUTE ONLY) 0955  Mobility Specialist Stop Time (ACUTE ONLY) 1002  Mobility Specialist Time Calculation (min) (ACUTE ONLY) 7 min   Pt received in bed and agreed to mobility, upon reaching hallway pt had some nausea. Opted to return to bed due to it with all needs met.  Marilynne Halsted Mobility Specialist

## 2023-09-25 DIAGNOSIS — K209 Esophagitis, unspecified without bleeding: Secondary | ICD-10-CM | POA: Diagnosis not present

## 2023-09-25 DIAGNOSIS — R110 Nausea: Secondary | ICD-10-CM | POA: Diagnosis not present

## 2023-09-25 LAB — CBC
HCT: 37.1 % (ref 36.0–46.0)
Hemoglobin: 12.1 g/dL (ref 12.0–15.0)
MCH: 29.2 pg (ref 26.0–34.0)
MCHC: 32.6 g/dL (ref 30.0–36.0)
MCV: 89.6 fL (ref 80.0–100.0)
Platelets: 285 10*3/uL (ref 150–400)
RBC: 4.14 MIL/uL (ref 3.87–5.11)
RDW: 14.5 % (ref 11.5–15.5)
WBC: 7.8 10*3/uL (ref 4.0–10.5)
nRBC: 0 % (ref 0.0–0.2)

## 2023-09-25 LAB — COMPREHENSIVE METABOLIC PANEL
ALT: 10 U/L (ref 0–44)
AST: 22 U/L (ref 15–41)
Albumin: 2.9 g/dL — ABNORMAL LOW (ref 3.5–5.0)
Alkaline Phosphatase: 39 U/L (ref 38–126)
Anion gap: 9 (ref 5–15)
BUN: 9 mg/dL (ref 6–20)
CO2: 25 mmol/L (ref 22–32)
Calcium: 8.1 mg/dL — ABNORMAL LOW (ref 8.9–10.3)
Chloride: 101 mmol/L (ref 98–111)
Creatinine, Ser: 0.67 mg/dL (ref 0.44–1.00)
GFR, Estimated: >60 mL/min (ref >60)
Glucose, Bld: 121 mg/dL — ABNORMAL HIGH (ref 70–99)
Potassium: 4.4 mmol/L (ref 3.5–5.1)
Sodium: 135 mmol/L (ref 135–145)
Total Bilirubin: 1.1 mg/dL (ref 0.3–1.2)
Total Protein: 5.3 g/dL — ABNORMAL LOW (ref 6.5–8.1)

## 2023-09-25 LAB — GLUCOSE, CAPILLARY
Glucose-Capillary: 111 mg/dL — ABNORMAL HIGH (ref 70–99)
Glucose-Capillary: 118 mg/dL — ABNORMAL HIGH (ref 70–99)
Glucose-Capillary: 120 mg/dL — ABNORMAL HIGH (ref 70–99)
Glucose-Capillary: 205 mg/dL — ABNORMAL HIGH (ref 70–99)
Glucose-Capillary: 245 mg/dL — ABNORMAL HIGH (ref 70–99)
Glucose-Capillary: 96 mg/dL (ref 70–99)

## 2023-09-25 LAB — SURGICAL PATHOLOGY

## 2023-09-25 MED ORDER — PANTOPRAZOLE SODIUM 40 MG PO TBEC: 40.0000 mg | DELAYED_RELEASE_TABLET | Freq: Two times a day (BID) | ORAL | 0 refills | Status: DC

## 2023-09-25 MED ORDER — ENOXAPARIN SODIUM 40 MG/0.4ML IJ SOSY
40.0000 mg | PREFILLED_SYRINGE | INTRAMUSCULAR | Status: DC
  Administered 2023-09-25: 40 mg via SUBCUTANEOUS
  Filled 2023-09-25: qty 0.4

## 2023-09-25 MED ORDER — SUCRALFATE 1 G PO TABS: 1.0000 g | ORAL_TABLET | Freq: Three times a day (TID) | ORAL | 0 refills | Status: DC

## 2023-09-25 MED ORDER — PANTOPRAZOLE SODIUM 40 MG PO TBEC: 40.0000 mg | DELAYED_RELEASE_TABLET | Freq: Two times a day (BID) | ORAL | Status: DC

## 2023-09-25 NOTE — Discharge Summary (Signed)
Physician Discharge Summary   Patient: [name] [name] MRN: [id number] DOB: Mar 16, 1961  Admit date:     09/22/2023  Discharge date: 09/25/23  Discharge Physician: Tyrone Nine   PCP: Everardo All, NP   Recommendations at discharge:   ***  Discharge Diagnoses: Principal Problem:   Intractable nausea Active Problems:   Abdominal pain   Acute esophagitis   Esophagitis   Abdominal pain, bilateral upper quadrant   Bile duct abnormality   Gastritis and gastroduodenitis   Multiple duodenal ulcers  Hospital Course: [name] [name] is a 62 y.o. female with a history of poorly controlled T1DM (HbA1c >15.5%), pancreatitis, HTN, hypothyroidism who presented to the ED on 09/22/2023 with intractable nausea, vomiting, epigastric pain. She was hyperglycemic with leukocytosis. Imaging showed normal pancreas and circumferential esophageal thickening suggestive of esophagitis. She was given IVF, subcutaneous insulin, antiemetics, PPI, carafate, and admitted. GI is consulted, EGD planned 10/15. ***  Assessment and Plan: Epigastric pain, nausea, vomiting: Known grade C esophagitis based on EGD earlier this year, CT imaging suggestive of this as well. Does not have dysphagia or hematemesis at this time. There is a history of pancreatitis, though imaging is reassuring and lipase level is normal at 25.  - IV PPI, carafate - NPO for now, defer postprocedure diet to GI.   - I have consulted [facility], planning EGD/EUS    T1DM:  - Continue basal insulin while NPO, restart mealtime insulin once taking po at lower dose.    Hyponatremia: Exaggerated by hyperglycemia.  - Continue IVF while NPO, recheck in AM.    Recent acute pancreatitis: Discharged 10/5. Not EtOH related and no gallstones. No acute findings on work up at this time.  - Continue lovaza.  - Pt/spouse request to discuss with GI the possibility of starting enzyme replacement therapy. The patient has 2 BMs per week on average. Defer to GI.     HLD:  - Continue rosuvastatin   Hypothyroidism: Last TSH was 26.970 in July.  - Continue synthroid. Clinically euthyroid, but will need recheck TFTs.    Stage II CKD: With slightly elevation of Cr on admission.  - Avoid nephrotoxins.    Hypokalemia: Resolved with supplementation.    Leukocytosis: Resolved without antimicrobial Tx.    Hypocalcemia: With hypoalbuminemia, Ca corrects to normal today.  Consultants: *** Procedures performed: ***  Disposition: {Plan; Disposition:26390} Diet recommendation:  {Diet_Plan:26776} DISCHARGE MEDICATION: Allergies as of 09/25/2023       Reactions   Codeine Hives, Anxiety, Other (See Comments)     Med Rec must be completed prior to using this [facility]***       Follow-up Information     Schedule an appointment as soon as possible for a visit  with Vida Rigger, MD.   Specialty: Gastroenterology Contact information: [address] [place] [address] [phone]                Discharge Exam: Ceasar Mons Weights   09/22/23 0129 09/24/23 1228  Weight: 56.2 kg 56.2 kg   ***  Condition at discharge: {DC Condition:26389}  The results of significant diagnostics from this hospitalization (including imaging, microbiology, ancillary and laboratory) are listed below for reference.   Imaging Studies: MR ABDOMEN MRCP W WO CONTAST  Result Date: 09/25/2023 CLINICAL DATA:  Intractable nausea. Epigastric pain. Pancreatic duct leak on endoscopy. EXAM: MRI ABDOMEN WITHOUT AND WITH CONTRAST (INCLUDING MRCP) TECHNIQUE: Multiplanar multisequence MR imaging of the abdomen was performed both before and after the administration  of intravenous contrast. Heavily T2-weighted images of the biliary and pancreatic ducts were obtained, and three-dimensional MRCP images were rendered by post processing. CONTRAST:  5mL GADAVIST GADOBUTROL 1 MMOL/ML IV SOLN COMPARISON:  CT on 09/22/2023 FINDINGS: Lower chest: No acute findings.  Hepatobiliary: No hepatic masses identified. 11 mm enhancing soft tissue nodule is seen in the gallbladder fundus which shows some T2 hypointensity and probable internal cystic foci. This may be due to a gallbladder polyp or focal adenomyosis. Mild biliary ductal dilatation is seen with common bile duct measuring 8 mm in diameter. A meniscus sign is seen in the distal common bile duct at the ampulla, raising suspicion for a distal common bile duct stone. Pancreas: No pancreatic mass or inflammatory changes. No evidence of pancreatic ductal dilatation or pancreas divisum. Spleen:  Within normal limits in size and appearance. Adrenals/Urinary Tract: No suspicious masses identified. No evidence of hydronephrosis. Stomach/Bowel: Unremarkable. Vascular/Lymphatic: No pathologically enlarged lymph nodes identified. No acute vascular findings. Other:  None. Musculoskeletal:  No suspicious bone lesions identified. IMPRESSION: Mild biliary ductal dilatation, with meniscus sign in the distal common bile duct at the ampulla, raising suspicion for a distal common bile duct stone. 11 mm enhancing soft tissue nodule in the gallbladder fundus, which may be due to a gallbladder polyp/neoplasm or focal adenomyosis. Recommend abdomen ultrasound for further evaluation. No radiographic evidence pancreatitis or pancreas divisum. Electronically Signed   By: Danae Orleans M.D.   On: 09/25/2023 11:18   MR 3D Recon At Scanner  Result Date: 09/25/2023 CLINICAL DATA:  Intractable nausea. Epigastric pain. Pancreatic duct leak on endoscopy. EXAM: MRI ABDOMEN WITHOUT AND WITH CONTRAST (INCLUDING MRCP) TECHNIQUE: Multiplanar multisequence MR imaging of the abdomen was performed both before and after the administration of intravenous contrast. Heavily T2-weighted images of the biliary and pancreatic ducts were obtained, and three-dimensional MRCP images were rendered by post processing. CONTRAST:  5mL GADAVIST GADOBUTROL 1 MMOL/ML IV SOLN  COMPARISON:  CT on 09/22/2023 FINDINGS: Lower chest: No acute findings. Hepatobiliary: No hepatic masses identified. 11 mm enhancing soft tissue nodule is seen in the gallbladder fundus which shows some T2 hypointensity and probable internal cystic foci. This may be due to a gallbladder polyp or focal adenomyosis. Mild biliary ductal dilatation is seen with common bile duct measuring 8 mm in diameter. A meniscus sign is seen in the distal common bile duct at the ampulla, raising suspicion for a distal common bile duct stone. Pancreas: No pancreatic mass or inflammatory changes. No evidence of pancreatic ductal dilatation or pancreas divisum. Spleen:  Within normal limits in size and appearance. Adrenals/Urinary Tract: No suspicious masses identified. No evidence of hydronephrosis. Stomach/Bowel: Unremarkable. Vascular/Lymphatic: No pathologically enlarged lymph nodes identified. No acute vascular findings. Other:  None. Musculoskeletal:  No suspicious bone lesions identified. IMPRESSION: Mild biliary ductal dilatation, with meniscus sign in the distal common bile duct at the ampulla, raising suspicion for a distal common bile duct stone. 11 mm enhancing soft tissue nodule in the gallbladder fundus, which may be due to a gallbladder polyp/neoplasm or focal adenomyosis. Recommend abdomen ultrasound for further evaluation. No radiographic evidence pancreatitis or pancreas divisum. Electronically Signed   By: Danae Orleans M.D.   On: 09/25/2023 11:18   CT ABDOMEN PELVIS W CONTRAST  Result Date: 09/22/2023 CLINICAL DATA:  Epigastric pain, nausea EXAM: CT ABDOMEN AND PELVIS WITH CONTRAST TECHNIQUE: Multidetector CT imaging of the abdomen and pelvis was performed using the standard protocol following bolus administration of intravenous contrast. RADIATION DOSE REDUCTION:  This exam was performed according to the departmental dose-optimization program which includes automated exposure control, adjustment of the mA and/or  kV according to patient size and/or use of iterative reconstruction technique. CONTRAST:  OMNIPAQUE IOHEXOL 300 MG/ML  SOLN COMPARISON:  09/02/2023 FINDINGS: Lower chest: Circumferential wall thickening in the distal esophagus suggesting esophagitis. Hepatobiliary: No focal hepatic abnormality. Gallbladder unremarkable. Pancreas: No focal abnormality or ductal dilatation. Spleen: No focal abnormality.  Normal size. Adrenals/Urinary Tract: No adrenal abnormality. No focal renal abnormality. No stones or hydronephrosis. Urinary bladder is unremarkable. Stomach/Bowel: Scattered colonic diverticulosis. No active diverticulitis. Normal appendix. Stomach and small bowel unremarkable. Vascular/Lymphatic: Scattered aortic calcifications. No evidence of aneurysm or adenopathy. Reproductive: Prior hysterectomy.  No adnexal masses. Other: No free fluid or free air. Musculoskeletal: No acute bony abnormality. IMPRESSION: Circumferential wall thickening in the visualized distal esophagus suggesting esophagitis. Colonic diverticulosis.  No active diverticulitis. Scattered aortic atherosclerosis. Electronically Signed   By: Charlett Nose M.D.   On: 09/22/2023 03:58   US Abdomen Limited RUQ (LIVER/GB)  Result Date: 09/12/2023 CLINICAL DATA:  [age] with acute pancreatitis. EXAM: ULTRASOUND ABDOMEN LIMITED RIGHT UPPER QUADRANT COMPARISON:  CT with IV contrast 09/02/2023 FINDINGS: Gallbladder: No gallstones or wall thickening visualized. No sonographic Murphy sign noted by sonographer. Common bile duct: Diameter: 5.7 mm mid segment, distally is 8 mm, but no more distended than previously. Older CT of 05/10/2022 also demonstrated this. Today however, there is also mild intrahepatic biliary prominence. Laboratory and clinical correlation advised. Liver: No focal lesion identified. Within normal limits in parenchymal echogenicity. Portal vein is patent on color Doppler imaging with normal direction of blood flow towards the  liver. Other: No right upper quadrant ascites. IMPRESSION: 1. Negative for gallstones. Negative for findings of acute cholecystitis. 2. Mild intrahepatic and extrahepatic biliary prominence. Laboratory and clinical correlation advised. Electronically Signed   By: Almira Bar M.D.   On: 09/12/2023 05:35   CT ABDOMEN PELVIS W CONTRAST  Result Date: 09/02/2023 CLINICAL DATA:  Abdominal pain, acute, nonlocalized. Urinary retention. EXAM: CT ABDOMEN AND PELVIS WITH CONTRAST TECHNIQUE: Multidetector CT imaging of the abdomen and pelvis was performed using the standard protocol following bolus administration of intravenous contrast. RADIATION DOSE REDUCTION: This exam was performed according to the departmental dose-optimization program which includes automated exposure control, adjustment of the mA and/or kV according to patient size and/or use of iterative reconstruction technique. CONTRAST:  OMNIPAQUE IOHEXOL 300 MG/ML  SOLN COMPARISON:  08/22/2023. FINDINGS: Lower chest: No acute findings. Heart is at the upper limits of normal in size. No pericardial or pleural effusion. Distal esophagus is grossly unremarkable. Hepatobiliary: Liver is enlarged, 23.0 cm. Liver and gallbladder are otherwise unremarkable. No biliary ductal dilatation. Pancreas: Negative. Spleen: Negative. Adrenals/Urinary Tract: Adrenal glands and kidneys are unremarkable. Ureters are decompressed. Bladder is thick-walled and decompressed with a Foley catheter in place. Air within the bladder is presumably iatrogenic in etiology. Stomach/Bowel: Stomach, small bowel, appendix and colon are unremarkable. Vascular/Lymphatic: Atherosclerotic calcification of the aorta. No pathologically enlarged lymph nodes. Reproductive: Hysterectomy.  No adnexal mass. Other: No free fluid. Mesenteries and peritoneum are unremarkable. Small umbilical hernia contains fat. Musculoskeletal: Degenerative changes in the spine. IMPRESSION: 1. No acute findings. 2.  Bladder wall thickening may be due to under distension and/or subacute/chronic cystitis. Air within the bladder is presumably due to Foley catheter placement. 3. Hepatomegaly. 4.  Aortic atherosclerosis (ICD10-I70.0). Electronically Signed   By: Leanna Battles M.D.   On: 09/02/2023 15:21  Microbiology: Results for orders placed or performed during the hospital encounter of 08/22/23  Culture, blood (single)     Status: None   Collection Time: 08/22/23  5:22 PM   Specimen: BLOOD  Result Value Ref Range Status   Specimen Description   Final    BLOOD SITE NOT SPECIFIED Performed at [facility], [address], [place], [place] [address]    Special Requests   Final    BOTTLES DRAWN AEROBIC AND ANAEROBIC Blood Culture results may not be optimal due to an excessive volume of blood received in culture bottles Performed at [facility], [address] [place], [place] [address]    Culture   Final    NO GROWTH 5 DAYS Performed at [facility], [address], [place], [place] [address]    Report Status 08/27/2023 FINAL  Final  Urine Culture (for pregnant, neutropenic or urologic patients or patients with an indwelling urinary catheter)     Status: Abnormal   Collection Time: 08/22/23  8:39 PM   Specimen: Urine, Clean Catch  Result Value Ref Range Status   Specimen Description   Final    URINE, CLEAN CATCH Performed at [facility], [address], [place], [place] [address]    Special Requests   Final    NONE Performed at [facility], [address], [place], [place] [address]    Culture (A)  Final    >=100,000 COLONIES/mL DIPHTHEROIDS(CORYNEBACTERIUM SPECIES) Standardized susceptibility testing for this organism is not available. Performed at [facility], [address] [place], [place] [address]    Report Status 08/23/2023 FINAL  Final  MRSA Next Gen by PCR, Nasal     Status: None    Collection Time: 08/23/23  2:19 AM   Specimen: Nasal Mucosa; Nasal Swab  Result Value Ref Range Status   MRSA by PCR Next Gen NOT DETECTED NOT DETECTED Final    Comment: (NOTE) The GeneXpert MRSA Assay (FDA approved for NASAL specimens only), is one component of a comprehensive MRSA colonization surveillance program. It is not intended to diagnose MRSA infection nor to guide or monitor treatment for MRSA infections. Test performance is not FDA approved in patients less than 67 years old. Performed at [facility], [address] [place], [place] [address]     Labs: CBC: Recent Labs  Lab 09/21/23 0245 09/22/23 0133 09/23/23 0525 09/24/23 0525 09/25/23 0520  WBC 14.4* 16.4* 8.4 9.7 7.8  NEUTROABS 11.5* 13.1*  --   --   --   HGB 14.1 14.4 12.8 13.3 12.1  HCT 40.9 43.7 38.6 40.4 37.1  MCV 85.4 88.1 88.7 87.8 89.6  PLT 477* 493* 321 339 285   Basic Metabolic Panel: Recent Labs  Lab 09/22/23 0133 09/22/23 0643 09/23/23 0525 09/24/23 0525 09/25/23 0520  NA 133* 131* 135 128* 135  K 3.2* 3.1* 3.5 4.6 4.4  CL 86* 92* 103 94* 101  CO2 27 24 23 26 25   GLUCOSE 340* 254* 125* 292* 121*  BUN 27* 24* 12 14 9   CREATININE 0.82 0.64 0.52 0.63 0.67  CALCIUM 8.8* 7.8* 7.8* 8.4* 8.1*   Liver Function Tests: Recent Labs  Lab 09/21/23 0245 09/22/23 0133 09/22/23 0643 09/24/23 0525 09/25/23 0520  AST 15 20 16  12* 22  ALT 19 19 14 13 10   ALKPHOS 81 76 66 56 39  BILITOT 1.4* 1.1 1.0 0.7 1.1  PROT 7.9 7.6 6.9 6.1*  5.3*  ALBUMIN 4.3 4.2 3.7 3.3* 2.9*   CBG: Recent Labs  Lab 09/24/23 2032 09/25/23 0013 09/25/23 0503 09/25/23 0737 09/25/23 1234  GLUCAP 172* 205* 120* 111* 245*    Discharge time spent: {LESS THAN/GREATER XBJY:78295} 30 minutes.  Signed: Tyrone Nine, MD [facility] 09/25/2023

## 2023-09-25 NOTE — Plan of Care (Signed)
  Problem: Metabolic: Goal: Ability to maintain appropriate glucose levels will improve Outcome: Progressing   Problem: Nutritional: Goal: Maintenance of adequate nutrition will improve Outcome: Progressing Goal: Maintenance of adequate weight for body size and type will improve Outcome: Progressing   Problem: Urinary Elimination: Goal: Ability to achieve and maintain adequate renal perfusion and functioning will improve Outcome: Progressing

## 2023-09-25 NOTE — Progress Notes (Signed)
Attending physician's note   I have taken a history, reviewed the chart, and examined the patient. I performed a substantive portion of this encounter, including complete performance of at least one of the key components, in conjunction with the APP. I agree with the APP's note, impression, and recommendations with my edits.   Patient underwent EGD/EUS on 10/15.  EGD with severe erosive esophagitis, gastritis, and duodenitis with nonbleeding duodenal ulcers (path pending).  EUS with hyperechoic stranding of the pancreas with tortuous/ectatic PD in the pancreatic head and genu.  CBD and common hepatic duct were evaluated and no choledocholithiasis.  Subsequent MRCP without pancreatic divisum.  Incidentally noted nodule at the GB fundus.  While there was a meniscus sign seen in the distal CBD at the ampulla, this area was seen in better detail on the EUS yesterday and no CDL noted.  There is no need to progress to ERCP.  Patient instead asking to advance her diet.  Abdominal pain is very chronic in nature and has been extensively evaluated radiographically over the years.  With regards to the GB fundus nodule on MRCP, this was not seen on ultrasound on 10/3.  Could have a surgical consult as an outpatient.  Inpatient GI service will sign off at this time.  Please do not hesitate to contact us with additional questions or concerns.  Doristine Locks, DO, FACG [phone] office         Daily Progress Note  DOA: 09/22/2023 Hospital Day: 4  Chief Complaint: nausea, vomiting, abdominal pain   ASSESSMENT    Brief Narrative:  [name] [name] is a 62 y.o. year old female with a history of DM, pancreatitis (recent), HTN, CKD 2. Admitted 10/14 with uncontrolled DM, low K+, upper abdominal pain and nausea. GI evaluated 10/14.   Nausea, vomiting, abdominal pain, recent pancreatitis, and possible biliary dilation on [place].  Symptoms possibly 2/2 to severe esophagitis and duodenal ulcers.  Also uncontrolled diabetes may be contributing to N/V.  EUS yesterday negative for stones. It did raise some concern for pancreatic divisum. However MRCP negative..   Gallbladder fundus nodule ( on MRI / MRCP).   Principal Problem:   Intractable nausea Active Problems:   Abdominal pain   Acute esophagitis   Esophagitis   Abdominal pain, bilateral upper quadrant   Bile duct abnormality   Gastritis and gastroduodenitis   Multiple duodenal ulcers   PLAN   -No plans for ERCP in light of EUS findings .  -No further GI recommendations   Subjective  Having intermittent LUQ pain. Wants to advance diet.   Objective   EGD and EUS 09/24/23 EGD impression: - No gross lesions in the proximal esophagus. - Severe LA Grade D erosive esophagitis with no bleeding was found from 28 to 39 cm. Biopsied. - Z-line irregular, 39 cm from the incisors. - Erythematous mucosa in the stomach. Biopsied. - Non-bleeding duodenal ulcers. - Duodenitis. Biopsied. - Congested major papilla.  EUS impression: - Pancreatic parenchymal abnormalities consisting of hyperechoic strands were noted in the pancreatic head, genu of the pancreas, pancreatic body and pancreatic tail. 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 The pancreatic duct itself was normal sized. There did not appear to be any evidence of any stenosis or strictures. - There was dilation/prominence in the common bile duct and in the common hepatic duct. No evidence of choledocholithiasis. - No malignant-appearing  lymph nodes were visualized in the celiac region (level 20), peripancreatic region and porta hepatis region.  Recent Labs    09/23/23 0525 09/24/23 0525 09/25/23 0520  WBC 8.4 9.7 7.8  HGB 12.8 13.3 12.1  HCT 38.6 40.4 37.1  PLT 321 339 285   BMET Recent Labs    09/23/23 0525 09/24/23 0525 09/25/23 0520  NA 135 128* 135  K  3.5 4.6 4.4  CL 103 94* 101  CO2 23 26 25   GLUCOSE 125* 292* 121*  BUN 12 14 9   CREATININE 0.52 0.63 0.67  CALCIUM 7.8* 8.4* 8.1*   LFT Recent Labs    09/25/23 0520  PROT 5.3*  ALBUMIN 2.9*  AST 22  ALT 10  ALKPHOS 39  BILITOT 1.1   PT/INR Recent Labs    09/24/23 0525  LABPROT 12.5  INR 0.9     Imaging:  MR 3D Recon At Scanner CLINICAL DATA:  Intractable nausea. Epigastric pain. Pancreatic duct leak on endoscopy.  EXAM: MRI ABDOMEN WITHOUT AND WITH CONTRAST (INCLUDING MRCP)  TECHNIQUE: Multiplanar multisequence MR imaging of the abdomen was performed both before and after the administration of intravenous contrast. Heavily T2-weighted images of the biliary and pancreatic ducts were obtained, and three-dimensional MRCP images were rendered by post processing.  CONTRAST:  5mL GADAVIST GADOBUTROL 1 MMOL/ML IV SOLN  COMPARISON:  CT on 09/22/2023  FINDINGS: Lower chest: No acute findings.  Hepatobiliary: No hepatic masses identified.  11 mm enhancing soft tissue nodule is seen in the gallbladder fundus which shows some T2 hypointensity and probable internal cystic foci. This may be due to a gallbladder polyp or focal adenomyosis.  Mild biliary ductal dilatation is seen with common bile duct measuring 8 mm in diameter. A meniscus sign is seen in the distal common bile duct at the ampulla, raising suspicion for a distal common bile duct stone.  Pancreas: No pancreatic mass or inflammatory changes. No evidence of pancreatic ductal dilatation or pancreas divisum.  Spleen:  Within normal limits in size and appearance.  Adrenals/Urinary Tract: No suspicious masses identified. No evidence of hydronephrosis.  Stomach/Bowel: Unremarkable.  Vascular/Lymphatic: No pathologically enlarged lymph nodes identified. No acute vascular findings.  Other:  None.  Musculoskeletal:  No suspicious bone lesions identified.  IMPRESSION: Mild biliary ductal dilatation,  with meniscus sign in the distal common bile duct at the ampulla, raising suspicion for a distal common bile duct stone.  11 mm enhancing soft tissue nodule in the gallbladder fundus, which may be due to a gallbladder polyp/neoplasm or focal adenomyosis. Recommend abdomen ultrasound for further evaluation.  No radiographic evidence pancreatitis or pancreas divisum.  Electronically Signed   By: Danae Orleans M.D.   On: 09/25/2023 11:18 MR ABDOMEN MRCP W WO CONTAST CLINICAL DATA:  Intractable nausea. Epigastric pain. Pancreatic duct leak on endoscopy.  EXAM: MRI ABDOMEN WITHOUT AND WITH CONTRAST (INCLUDING MRCP)  TECHNIQUE: Multiplanar multisequence MR imaging of the abdomen was performed both before and after the administration of intravenous contrast. Heavily T2-weighted images of the biliary and pancreatic ducts were obtained, and three-dimensional MRCP images were rendered by post processing.  CONTRAST:  5mL GADAVIST GADOBUTROL 1 MMOL/ML IV SOLN  COMPARISON:  CT on 09/22/2023  FINDINGS: Lower chest: No acute findings.  Hepatobiliary: No hepatic masses identified.  11 mm enhancing soft tissue nodule is seen in the gallbladder fundus which shows some T2 hypointensity and probable internal cystic foci. This may be due to a gallbladder polyp or focal adenomyosis.  Mild biliary  ductal dilatation is seen with common bile duct measuring 8 mm in diameter. A meniscus sign is seen in the distal common bile duct at the ampulla, raising suspicion for a distal common bile duct stone.  Pancreas: No pancreatic mass or inflammatory changes. No evidence of pancreatic ductal dilatation or pancreas divisum.  Spleen:  Within normal limits in size and appearance.  Adrenals/Urinary Tract: No suspicious masses identified. No evidence of hydronephrosis.  Stomach/Bowel: Unremarkable.  Vascular/Lymphatic: No pathologically enlarged lymph nodes identified. No acute vascular  findings.  Other:  None.  Musculoskeletal:  No suspicious bone lesions identified.  IMPRESSION: Mild biliary ductal dilatation, with meniscus sign in the distal common bile duct at the ampulla, raising suspicion for a distal common bile duct stone.  11 mm enhancing soft tissue nodule in the gallbladder fundus, which may be due to a gallbladder polyp/neoplasm or focal adenomyosis. Recommend abdomen ultrasound for further evaluation.  No radiographic evidence pancreatitis or pancreas divisum.  Electronically Signed   By: Danae Orleans M.D.   On: 09/25/2023 11:18     Scheduled inpatient medications:   aspirin EC  81 mg Oral Daily   enoxaparin (LOVENOX) injection  40 mg Subcutaneous Q24H   escitalopram  10 mg Oral Daily   insulin aspart  0-15 Units Subcutaneous TID WC   insulin aspart  0-5 Units Subcutaneous QHS   insulin aspart  2 Units Subcutaneous TID WC   insulin glargine-yfgn  6 Units Subcutaneous QHS   levothyroxine  137 mcg Oral QAC breakfast   midodrine  5 mg Oral Daily   omega-3 acid ethyl esters  2 g Oral BID   pantoprazole  40 mg Oral BID   QUEtiapine  50 mg Oral QHS   ranolazine  500 mg Oral BID   rosuvastatin  10 mg Oral QHS   sucralfate  1 g Oral TID WC & HS   tamsulosin  0.4 mg Oral QPC supper   Continuous inpatient infusions:  PRN inpatient medications: acetaminophen **OR** acetaminophen, albuterol, dextrose, HYDROcodone-acetaminophen, HYDROmorphone (DILAUDID) injection, ondansetron **OR** ondansetron (ZOFRAN) IV, traZODone  Vital signs in last 24 hours: Temp:  [97.8 F (36.6 C)-98.3 F (36.8 C)] 97.8 F (36.6 C) (10/16 1235) Pulse Rate:  [72-96] 96 (10/16 1235) Resp:  [16-18] 16 (10/16 0507) BP: (82-159)/(60-95) 159/95 (10/16 1235) SpO2:  [98 %-100 %] 100 % (10/16 1235) Last BM Date : 09/23/23  Intake/Output Summary (Last 24 hours) at 09/25/2023 1517 Last data filed at 09/25/2023 1500 Gross per 24 hour  Intake 1563.19 ml  Output --  Net 1563.19  ml    Intake/Output from previous day: 10/15 0701 - 10/16 0700 In: 1773.2 [P.O.:720; I.V.:1053.2] Out: -  Intake/Output this shift: Total I/O In: 240 [P.O.:240] Out: -    Physical Exam:  General: Alert female in NAD Heart:  Regular rate and rhythm.  Pulmonary: Normal respiratory effort Abdomen: Soft, nondistended, nontender. Normal bowel sounds. Extremities: No lower extremity edema  Neurologic: Alert and oriented Psych: Pleasant. Cooperative. Insight appears normal.      LOS: 1 day   Willette Cluster ,NP 09/25/2023, 3:17 PM

## 2023-09-25 NOTE — Plan of Care (Signed)
  Problem: Fluid Volume: Goal: Ability to achieve a balanced intake and output will improve Outcome: Progressing   Problem: Metabolic: Goal: Ability to maintain appropriate glucose levels will improve Outcome: Progressing   Problem: Respiratory: Goal: Will regain and/or maintain adequate ventilation Outcome: Progressing   Problem: Fluid Volume: Goal: Ability to maintain a balanced intake and output will improve Outcome: Progressing   Problem: Clinical Measurements: Goal: Respiratory complications will improve Outcome: Progressing Goal: Cardiovascular complication will be avoided Outcome: Progressing   Problem: Pain Managment: Goal: General experience of comfort will improve Outcome: Progressing   Problem: Safety: Goal: Ability to remain free from injury will improve Outcome: Progressing

## 2023-09-26 ENCOUNTER — Encounter (HOSPITAL_COMMUNITY): Payer: Self-pay | Admitting: Gastroenterology

## 2023-09-27 ENCOUNTER — Encounter: Payer: Self-pay | Admitting: Gastroenterology

## 2023-10-01 DIAGNOSIS — F4312 Post-traumatic stress disorder, chronic: Secondary | ICD-10-CM | POA: Diagnosis not present

## 2023-10-01 DIAGNOSIS — F319 Bipolar disorder, unspecified: Secondary | ICD-10-CM | POA: Diagnosis not present

## 2023-10-05 ENCOUNTER — Encounter (HOSPITAL_COMMUNITY): Payer: Self-pay | Admitting: Emergency Medicine

## 2023-10-05 ENCOUNTER — Other Ambulatory Visit: Payer: Self-pay

## 2023-10-05 ENCOUNTER — Emergency Department (HOSPITAL_COMMUNITY)
Admission: EM | Admit: 2023-10-05 | Discharge: 2023-10-05 | Disposition: A | Discharge status: Home / Self Care | Payer: BC Managed Care – PPO | Attending: Emergency Medicine | Admitting: Emergency Medicine

## 2023-10-05 DIAGNOSIS — D72829 Elevated white blood cell count, unspecified: Secondary | ICD-10-CM | POA: Insufficient documentation

## 2023-10-05 DIAGNOSIS — Z794 Long term (current) use of insulin: Secondary | ICD-10-CM | POA: Diagnosis not present

## 2023-10-05 DIAGNOSIS — Z7982 Long term (current) use of aspirin: Secondary | ICD-10-CM | POA: Diagnosis not present

## 2023-10-05 DIAGNOSIS — N189 Chronic kidney disease, unspecified: Secondary | ICD-10-CM | POA: Diagnosis not present

## 2023-10-05 DIAGNOSIS — E1165 Type 2 diabetes mellitus with hyperglycemia: Secondary | ICD-10-CM | POA: Insufficient documentation

## 2023-10-05 DIAGNOSIS — K2970 Gastritis, unspecified, without bleeding: Secondary | ICD-10-CM | POA: Diagnosis not present

## 2023-10-05 DIAGNOSIS — I2510 Atherosclerotic heart disease of native coronary artery without angina pectoris: Secondary | ICD-10-CM | POA: Diagnosis not present

## 2023-10-05 DIAGNOSIS — R739 Hyperglycemia, unspecified: Secondary | ICD-10-CM

## 2023-10-05 DIAGNOSIS — E871 Hypo-osmolality and hyponatremia: Secondary | ICD-10-CM | POA: Insufficient documentation

## 2023-10-05 DIAGNOSIS — E1122 Type 2 diabetes mellitus with diabetic chronic kidney disease: Secondary | ICD-10-CM | POA: Insufficient documentation

## 2023-10-05 DIAGNOSIS — R109 Unspecified abdominal pain: Secondary | ICD-10-CM | POA: Diagnosis not present

## 2023-10-05 DIAGNOSIS — R1084 Generalized abdominal pain: Secondary | ICD-10-CM | POA: Diagnosis not present

## 2023-10-05 DIAGNOSIS — R1013 Epigastric pain: Secondary | ICD-10-CM | POA: Diagnosis not present

## 2023-10-05 DIAGNOSIS — K209 Esophagitis, unspecified without bleeding: Secondary | ICD-10-CM | POA: Insufficient documentation

## 2023-10-05 DIAGNOSIS — R9431 Abnormal electrocardiogram [ECG] [EKG]: Secondary | ICD-10-CM | POA: Diagnosis not present

## 2023-10-05 DIAGNOSIS — R112 Nausea with vomiting, unspecified: Secondary | ICD-10-CM

## 2023-10-05 DIAGNOSIS — R1111 Vomiting without nausea: Secondary | ICD-10-CM | POA: Diagnosis not present

## 2023-10-05 LAB — COMPREHENSIVE METABOLIC PANEL
ALT: 16 U/L (ref 0–44)
AST: 16 U/L (ref 15–41)
Albumin: 4.3 g/dL (ref 3.5–5.0)
Alkaline Phosphatase: 78 U/L (ref 38–126)
Anion gap: 16 — ABNORMAL HIGH (ref 5–15)
BUN: 19 mg/dL (ref 6–20)
CO2: 22 mmol/L (ref 22–32)
Calcium: 9 mg/dL (ref 8.9–10.3)
Chloride: 89 mmol/L — ABNORMAL LOW (ref 98–111)
Creatinine, Ser: 0.7 mg/dL (ref 0.44–1.00)
GFR, Estimated: >60 mL/min (ref >60)
Glucose, Bld: 403 mg/dL — ABNORMAL HIGH (ref 70–99)
Potassium: 4.2 mmol/L (ref 3.5–5.1)
Sodium: 127 mmol/L — ABNORMAL LOW (ref 135–145)
Total Bilirubin: 0.9 mg/dL (ref 0.3–1.2)
Total Protein: 7.9 g/dL (ref 6.5–8.1)

## 2023-10-05 LAB — URINALYSIS, ROUTINE W REFLEX MICROSCOPIC
Bilirubin Urine: NEGATIVE
Glucose, UA: >=500 mg/dL — AB
Hgb urine dipstick: NEGATIVE
Ketones, ur: 20 mg/dL — AB
Leukocytes,Ua: NEGATIVE
Nitrite: NEGATIVE
Protein, ur: 30 mg/dL — AB
Specific Gravity, Urine: 1.028 (ref 1.005–1.030)
pH: 6 (ref 5.0–8.0)

## 2023-10-05 LAB — CBC
HCT: 42 % (ref 36.0–46.0)
Hemoglobin: 14.2 g/dL (ref 12.0–15.0)
MCH: 29.2 pg (ref 26.0–34.0)
MCHC: 33.8 g/dL (ref 30.0–36.0)
MCV: 86.2 fL (ref 80.0–100.0)
Platelets: 405 10*3/uL — ABNORMAL HIGH (ref 150–400)
RBC: 4.87 MIL/uL (ref 3.87–5.11)
RDW: 14.3 % (ref 11.5–15.5)
WBC: 14.9 10*3/uL — ABNORMAL HIGH (ref 4.0–10.5)
nRBC: 0 % (ref 0.0–0.2)

## 2023-10-05 LAB — LIPASE, BLOOD: Lipase: 31 U/L (ref 11–51)

## 2023-10-05 LAB — CBG MONITORING, ED: Glucose-Capillary: 299 mg/dL — ABNORMAL HIGH (ref 70–99)

## 2023-10-05 MED ORDER — HYDROMORPHONE HCL 1 MG/ML IJ SOLN
1.0000 mg | Freq: Once | INTRAMUSCULAR | Status: AC
  Administered 2023-10-05: 1 mg via INTRAVENOUS
  Filled 2023-10-05: qty 1

## 2023-10-05 MED ORDER — ONDANSETRON HCL 4 MG/2ML IJ SOLN
4.0000 mg | Freq: Once | INTRAMUSCULAR | Status: AC
  Administered 2023-10-05: 4 mg via INTRAVENOUS
  Filled 2023-10-05: qty 2

## 2023-10-05 MED ORDER — SODIUM CHLORIDE 0.9 % IV BOLUS
1000.0000 mL | Freq: Once | INTRAVENOUS | Status: AC
  Administered 2023-10-05: 1000 mL via INTRAVENOUS

## 2023-10-05 MED ORDER — OXYCODONE HCL 5 MG PO TABS
5.0000 mg | ORAL_TABLET | Freq: Once | ORAL | Status: AC
  Administered 2023-10-05: 5 mg via ORAL
  Filled 2023-10-05: qty 1

## 2023-10-05 MED ORDER — PANTOPRAZOLE SODIUM 40 MG IV SOLR
40.0000 mg | Freq: Once | INTRAVENOUS | Status: AC
  Administered 2023-10-05: 40 mg via INTRAVENOUS
  Filled 2023-10-05: qty 10

## 2023-10-05 MED ORDER — HYDROMORPHONE HCL 1 MG/ML IJ SOLN
0.5000 mg | Freq: Once | INTRAMUSCULAR | Status: AC
  Administered 2023-10-05: 0.5 mg via INTRAVENOUS
  Filled 2023-10-05: qty 1

## 2023-10-05 MED ORDER — PROMETHAZINE HCL 25 MG PO TABS: 25.0000 mg | ORAL_TABLET | Freq: Four times a day (QID) | ORAL | 0 refills | Status: DC | PRN

## 2023-10-05 MED ORDER — PROMETHAZINE HCL 25 MG RE SUPP: 25.0000 mg | Freq: Four times a day (QID) | RECTAL | 0 refills | Status: DC | PRN

## 2023-10-05 NOTE — ED Provider Notes (Signed)
[place] EMERGENCY DEPARTMENT AT [facility] Provider Note   CSN: [id number] Arrival date & time: 10/05/23  4098     History  Chief Complaint  Patient presents with   Abdominal Pain    [name] [name] is a 62 y.o. female.  Patient with history of chronic abdominal pain on Norco, chronic kidney disease, paroxysmal A-fib and CAD, diabetes complicated by DKA, recent hospitalization with EGD/EUS on 09/24/2023 showing severe erosive esophagitis, gastritis, and duodenitis with nonbleeding duodenal ulcers.  Patient presents today with worsening of her upper abdominal pain and vomiting.  Symptoms worsened yesterday.  She is taking home Protonix and Carafate.  She continues to take her home pain medication but states that it is not doing much.  No fevers.  No diarrhea.  No urinary symptoms.  Patient's reports that pain is similar to pain that brought her to the hospital in the past.       Home Medications Prior to Admission medications   Medication Sig Start Date End Date Taking? Authorizing Provider  aspirin EC 81 MG tablet Take 81 mg by mouth daily. Swallow whole.    [provider]  Continuous Blood Gluc Sensor (DEXCOM G6 SENSOR) MISC INJECT 1 SENSOR INTO SKIN EVERY 10 DAYS Patient taking differently: Inject 1 Device into the skin See admin instructions. Place 1 new device into the skin every 10 days 07/18/22   Excell Seltzer, MD  Continuous Blood Gluc Transmit (DEXCOM G6 TRANSMITTER) MISC USE AS DIRECTED FOR CONTINUOUS GLUCOSE MONITORING. REUSE TRANSMITTER X 90DAYS THEN DISCARD & REPLACE 07/05/21   Bedsole, Amy E, MD  escitalopram (LEXAPRO) 10 MG tablet Take 10 mg by mouth daily. 09/09/23   [provider]  Glucagon, rDNA, (GLUCAGON EMERGENCY) 1 MG KIT Inject 1 mg into the skin See admin instructions. Inject one mg into the skin see administration instructions. Follow package directions for low blood sugar. 06/18/23   [provider]   HYDROcodone-acetaminophen (NORCO/VICODIN) 5-325 MG tablet Take 1 tablet by mouth every 4 (four) hours as needed for moderate pain. Patient not taking: Reported on 09/22/2023 09/21/23   Gilda Crease, MD  hyoscyamine (LEVSIN/SL) 0.125 MG SL tablet Place 1 tablet (0.125 mg total) under the tongue every 4 (four) hours as needed. Patient not taking: Reported on 09/22/2023 09/21/23   Gilda Crease, MD  levothyroxine (SYNTHROID) 137 MCG tablet Take 137 mcg by mouth daily before breakfast. 03/19/22   [provider]  metoCLOPramide (REGLAN) 5 MG tablet Take 1 tablet (5 mg total) by mouth 3 (three) times daily before meals. Patient taking differently: Take 5 mg by mouth as needed for nausea or vomiting. 02/06/23   Albertine Grates, MD  midodrine (PROAMATINE) 5 MG tablet Take 1 tablet (5 mg total) by mouth 2 (two) times daily with a meal. Hold if your systolic blood pressure  ( top number) is > , please check your blood pressure at home once a day and bring in record for your doctor to review, follow up with your pcp and cardiology regarding your blood pressure medication Patient taking differently: Take 5 mg by mouth daily. 02/06/23   Albertine Grates, MD  NOVOLOG FLEXPEN 100 UNIT/ML FlexPen Inject 4 Units into the skin 3 (three) times daily with meals.    [provider]  omega-3 acid ethyl esters (LOVAZA) 1 g capsule Take 2 capsules (2 g total) by mouth 2 (two) times daily. 09/13/23   Alwyn Ren, MD  ondansetron (ZOFRAN) 4 MG tablet  Take 1 tablet (4 mg total) by mouth every 6 (six) hours as needed for nausea. 09/13/23   Alwyn Ren, MD  pantoprazole (PROTONIX) 40 MG tablet Take 1 tablet (40 mg total) by mouth 2 (two) times daily. 09/25/23 10/25/23  Tyrone Nine, MD  QUEtiapine (SEROQUEL) 50 MG tablet Take 50 mg by mouth daily. 09/09/23   [provider]  ranolazine (RANEXA) 500 MG 12 hr tablet Take 500 mg by mouth 2 (two) times daily. 07/23/23   [provider]  rosuvastatin (CRESTOR) 10 MG tablet Take 10 mg by mouth daily. 06/17/23   [provider]  sucralfate (CARAFATE) 1 g tablet Take 1 tablet (1 g total) by mouth 4 (four) times daily -  with meals and at bedtime for 14 days. 09/25/23 10/09/23  Tyrone Nine, MD  tamsulosin (FLOMAX) 0.4 MG CAPS capsule Take 1 capsule (0.4 mg total) by mouth daily after supper. 09/04/23   Arnetha Courser, MD  TOUJEO SOLOSTAR 300 UNIT/ML Solostar Pen Inject 12 Units into the skin at bedtime. Patient taking differently: Inject 8 Units into the skin at bedtime. 08/24/23   Lewie Chamber, MD  traZODone (DESYREL) 50 MG tablet Take 50 mg by mouth at bedtime.    [provider]      Allergies    Codeine    Review of Systems   Review of Systems  Physical Exam Updated Vital Signs BP 110/79 (BP Location: Left Arm)   Pulse 100   Temp 99.1 F (37.3 C) (Oral)   Resp 20   Ht 4\' 11"  (1.499 m)   Wt 56.2 kg   LMP 08/11/2012   SpO2 100%   BMI 25.02 kg/m  Physical Exam Vitals and nursing note reviewed.  Constitutional:      General: She is not in acute distress.    Appearance: She is well-developed.  HENT:     Head: Normocephalic and atraumatic.     Right Ear: External ear normal.     Left Ear: External ear normal.     Nose: Nose normal.  Eyes:     Conjunctiva/sclera: Conjunctivae normal.  Cardiovascular:     Rate and Rhythm: Normal rate and regular rhythm.     Heart sounds: No murmur heard. Pulmonary:     Effort: No respiratory distress.     Breath sounds: No wheezing, rhonchi or rales.  Abdominal:     Palpations: Abdomen is soft.     Tenderness: There is abdominal tenderness. There is no guarding or rebound.  Musculoskeletal:     Cervical back: Normal range of motion and neck supple.     Right lower leg: No edema.     Left lower leg: No edema.  Skin:    General: Skin is warm and dry.     Findings: No rash.  Neurological:     General: No focal deficit present.     Mental  Status: She is alert. Mental status is at baseline.     Motor: No weakness.  Psychiatric:        Mood and Affect: Mood normal.     ED Results / Procedures / Treatments   Labs (all labs ordered are listed, but only abnormal results are displayed) Labs Reviewed  COMPREHENSIVE METABOLIC PANEL - Abnormal; Notable for the following components:      Result Value   Sodium 127 (*)    Chloride 89 (*)    Glucose, Bld 403 (*)    Anion gap 16 (*)  All other components within normal limits  CBC - Abnormal; Notable for the following components:   WBC 14.9 (*)    Platelets 405 (*)    All other components within normal limits  URINALYSIS, ROUTINE W REFLEX MICROSCOPIC - Abnormal; Notable for the following components:   Glucose, UA >=500 (*)    Ketones, ur 20 (*)    Protein, ur 30 (*)    Bacteria, UA RARE (*)    All other components within normal limits  CBG MONITORING, ED - Abnormal; Notable for the following components:   Glucose-Capillary 299 (*)    All other components within normal limits  LIPASE, BLOOD    ED ECG REPORT   Date: 10/05/2023  Rate: 93  Rhythm: normal sinus rhythm  QRS Axis: normal  Intervals: normal  ST/T Wave abnormalities: normal  Conduction Disutrbances:none  Narrative Interpretation:   Old EKG Reviewed: unchanged  I have personally reviewed the EKG tracing and agree with the computerized printout as noted.    Radiology No results found.  Procedures Procedures    Medications Ordered in ED Medications  HYDROmorphone (DILAUDID) injection 0.5 mg (has no administration in time range)  ondansetron (ZOFRAN) injection 4 mg (4 mg Intravenous Given 10/05/23 0906)  HYDROmorphone (DILAUDID) injection 1 mg (1 mg Intravenous Given 10/05/23 0906)  pantoprazole (PROTONIX) injection 40 mg (40 mg Intravenous Given 10/05/23 0906)  sodium chloride 0.9 % bolus 1,000 mL (0 mLs Intravenous Stopped 10/05/23 1138)  sodium chloride 0.9 % bolus 1,000 mL (1,000 mLs  Intravenous New Bag/Given 10/05/23 1127)  oxyCODONE (Oxy IR/ROXICODONE) immediate release tablet 5 mg (5 mg Oral Given 10/05/23 1138)    ED Course/ Medical Decision Making/ A&P   Patient seen and examined. History obtained directly from patient. Work-up including labs, imaging, EKG ordered in triage, if performed, were reviewed.    Labs/EKG: Independently reviewed and interpreted.  This included: CBC with elevated white blood cell count at 14.9, hemoglobin normal at 14.2 otherwise unremarkable; CMP with low sodium at 127, low chloride at 89, glucose elevated at 4 3 with normal bicarbonate and minimally elevated anion gap at 16; lipase normal at 31 today.  EKG personally reviewed and interpreted as above.  Imaging: None ordered.  Medications/Fluids: Ordered: IV fluid bolus, IV Dilaudid, IV Zofran, IV Protonix  Most recent vital signs reviewed and are as follows: BP 110/79 (BP Location: Left Arm)   Pulse 100   Temp 99.1 F (37.3 C) (Oral)   Resp 20   Ht 4\' 11"  (1.499 m)   Wt 56.2 kg   LMP 08/11/2012   SpO2 100%   BMI 25.02 kg/m   Initial impression: Patient's current situation seems to be very consistent with which she has been evaluated for most recently during hospitalization 10 days ago.  Liver and kidney function stable.  Lipase is normal today.  EKG without signs of ischemia.  Will attempt to treat symptoms and at least get her tolerating oral fluids.  Will hydrate with IV, will recheck blood sugar.  At this point, low concern for DKA.  10:57 AM Reassessment performed. Patient appears more comfortable.  States that the pain medication helped.  She has received 1 L IV fluids, second liter ordered.  Ask for p.o. challenge with liquids.  Will recheck blood sugar.  Reviewed pertinent lab work and imaging with patient at bedside. Questions answered.   Most current vital signs reviewed and are as follows: BP 110/79 (BP Location: Left Arm)   Pulse 97   Temp  99.1 F (37.3 C) (Oral)    Resp 20   Ht 4\' 11"  (1.499 m)   Wt 56.2 kg   LMP 08/11/2012   SpO2 100%   BMI 25.02 kg/m   Plan: Continue to hydrate, p.o. challenge.  2:20 PM Reassessment performed. Patient appears stable. She has tolerated some sips of fluids.   Labs personally reviewed and interpreted including: Blood sugar into 200s.   Reviewed pertinent lab work and imaging with patient at bedside. Questions answered.   Most current vital signs reviewed and are as follows: BP 128/78   Pulse 80   Temp 98.3 F (36.8 C) (Oral)   Resp 17   Ht 4\' 11"  (1.499 m)   Wt 56.2 kg   LMP 08/11/2012   SpO2 98%   BMI 25.02 kg/m   Plan: Discharge to home.   Prescriptions written for: Phenergan  Other home care instructions discussed: Clear liquid to very bland diet. Keep taking home meds.   ED return instructions discussed: The patient was urged to return to the Emergency Department immediately with worsening of current symptoms, worsening abdominal pain, persistent vomiting, blood noted in stools, fever, or any other concerns. The patient verbalized understanding.   Follow-up instructions discussed: Patient encouraged to follow-up with their PCP in 3 days, GI as planned.                                 Medical Decision Making Amount and/or Complexity of Data Reviewed Labs: ordered.  Risk Prescription drug management.   For this patient's complaint of abdominal pain, the following conditions were considered on the differential diagnosis: gastritis/PUD, enteritis/duodenitis, appendicitis, cholelithiasis/cholecystitis, cholangitis, pancreatitis, ruptured viscus, colitis, diverticulitis, small/large bowel obstruction, proctitis, cystitis, pyelonephritis, ureteral colic, aortic dissection, aortic aneurysm. In women, ectopic pregnancy, pelvic inflammatory disease, ovarian cysts, and tubo-ovarian abscess were also considered. Atypical chest etiologies were also considered including ACS, PE, and pneumonia.  Blood  sugar elevated today but no evidence of DKA.  Patient was hydrated with 2 L IV fluids, has urinated.  Kidney function remains normal.  Patient is in a tough situation with significant esophagitis, gastritis and duodenitis.  She is taking her medications but symptoms have been very poorly controlled.  Patient was treated and monitored for several hours in the emergency department today.  Overall she is in improved condition and is no longer vomiting.  She is better hydrated.  Will continue to trial symptomatic treatment at home, however patient remains high risk to return to the emergency department with uncontrolled symptoms.  The patient's vital signs, pertinent lab work and imaging were reviewed and interpreted as discussed in the ED course. Hospitalization was considered for further testing, treatments, or serial exams/observation. However as patient is well-appearing, has a stable exam, and reassuring studies today, I do not feel that they warrant admission at this time. This plan was discussed with the patient who verbalizes agreement with this plan and seems reliable and able to return to the Emergency Department with worsening or changing symptoms.          Final Clinical Impression(s) / ED Diagnoses Final diagnoses:  Esophagitis  Gastritis without bleeding, unspecified chronicity, unspecified gastritis type  Nausea and vomiting, unspecified vomiting type  Hyperglycemia    Rx / DC Orders ED Discharge Orders          Ordered    promethazine (PHENERGAN) 25 MG tablet  Every 6 hours PRN  10/05/23 1422    promethazine (PHENERGAN) 25 MG suppository  Every 6 hours PRN        10/05/23 1422              Renne Crigler, PA-C 10/05/23 1426    Wynetta Fines, MD 10/05/23 1447

## 2023-10-05 NOTE — ED Notes (Signed)
Pt was giving a cup of water done well

## 2023-10-05 NOTE — ED Triage Notes (Signed)
Patient c/o epigastric pain ongoing for 2 weeks. Dx with stomach ulcer but has not followed up with G. Hx of pancreatitis. Endorses nausea.   BP 134/86, HR 96, Spo2 96%

## 2023-10-05 NOTE — Discharge Instructions (Signed)
Please read and follow all provided instructions.  Your diagnoses today include:  1. Esophagitis   2. Gastritis without bleeding, unspecified chronicity, unspecified gastritis type   3. Nausea and vomiting, unspecified vomiting type   4. Hyperglycemia     Tests performed today include: Blood cell counts and platelets: white count was elevated Kidney and liver function tests: kidney function normal, blood sugar improved Pancreas function test (called lipase) Urine test to look for infection Vital signs. See below for your results today.   Medications prescribed:  Phenergan (promethazine) - for nausea and vomiting  Take any prescribed medications only as directed.  Home care instructions:  Follow any educational materials contained in this packet. Continue focusing on hydration and maintain clear liquid to very bland diet Use your home acid blocking medications and Carafate as prescribed Use Phenergan on a schedule to help prevent vomiting from occurring  Follow-up instructions: Please follow-up with your primary care provider in the next 2-3 days for further evaluation of your symptoms.    Return instructions:  SEEK IMMEDIATE MEDICAL ATTENTION IF: The pain does not go away or becomes severe  A temperature above 101F develops  Repeated vomiting occurs (multiple episodes)  The pain becomes localized to portions of the abdomen. The right side could possibly be appendicitis. In an adult, the left lower portion of the abdomen could be colitis or diverticulitis.  Blood is being passed in stools or vomit (bright red or black tarry stools)  You develop chest pain, difficulty breathing, dizziness or fainting, or become confused, poorly responsive, or inconsolable (young children) If you have any other emergent concerns regarding your health  Additional Information: Abdominal (belly) pain can be caused by many things. Your caregiver performed an examination and possibly ordered  blood/urine tests and imaging (CT scan, x-rays, ultrasound). Many cases can be observed and treated at home after initial evaluation in the emergency department. Even though you are being discharged home, abdominal pain can be unpredictable. Therefore, you need a repeated exam if your pain does not resolve, returns, or worsens. Most patients with abdominal pain don't have to be admitted to the hospital or have surgery, but serious problems like appendicitis and gallbladder attacks can start out as nonspecific pain. Many abdominal conditions cannot be diagnosed in one visit, so follow-up evaluations are very important.  Your vital signs today were: BP 128/78   Pulse 80   Temp 98.3 F (36.8 C) (Oral)   Resp 17   Ht 4\' 11"  (1.499 m)   Wt 56.2 kg   LMP 08/11/2012   SpO2 98%   BMI 25.02 kg/m  If your blood pressure (bp) was elevated above 135/85 this visit, please have this repeated by your doctor within one month. --------------

## 2023-10-06 ENCOUNTER — Encounter (HOSPITAL_COMMUNITY): Payer: Self-pay

## 2023-10-06 ENCOUNTER — Emergency Department (HOSPITAL_COMMUNITY)
Admission: EM | Admit: 2023-10-06 | Discharge: 2023-10-06 | Disposition: A | Discharge status: Home / Self Care | Payer: BC Managed Care – PPO | Attending: Student | Admitting: Student

## 2023-10-06 DIAGNOSIS — E109 Type 1 diabetes mellitus without complications: Secondary | ICD-10-CM | POA: Insufficient documentation

## 2023-10-06 DIAGNOSIS — Z79899 Other long term (current) drug therapy: Secondary | ICD-10-CM | POA: Insufficient documentation

## 2023-10-06 DIAGNOSIS — Z87891 Personal history of nicotine dependence: Secondary | ICD-10-CM | POA: Diagnosis not present

## 2023-10-06 DIAGNOSIS — I509 Heart failure, unspecified: Secondary | ICD-10-CM | POA: Insufficient documentation

## 2023-10-06 DIAGNOSIS — I110 Hypertensive heart disease with heart failure: Secondary | ICD-10-CM | POA: Insufficient documentation

## 2023-10-06 DIAGNOSIS — R079 Chest pain, unspecified: Secondary | ICD-10-CM | POA: Diagnosis not present

## 2023-10-06 DIAGNOSIS — R112 Nausea with vomiting, unspecified: Secondary | ICD-10-CM | POA: Diagnosis not present

## 2023-10-06 DIAGNOSIS — R1013 Epigastric pain: Secondary | ICD-10-CM | POA: Diagnosis not present

## 2023-10-06 DIAGNOSIS — E039 Hypothyroidism, unspecified: Secondary | ICD-10-CM | POA: Insufficient documentation

## 2023-10-06 DIAGNOSIS — Z7982 Long term (current) use of aspirin: Secondary | ICD-10-CM | POA: Diagnosis not present

## 2023-10-06 DIAGNOSIS — I10 Essential (primary) hypertension: Secondary | ICD-10-CM | POA: Diagnosis not present

## 2023-10-06 DIAGNOSIS — R1084 Generalized abdominal pain: Secondary | ICD-10-CM | POA: Diagnosis not present

## 2023-10-06 LAB — COMPREHENSIVE METABOLIC PANEL
ALT: 17 U/L (ref 0–44)
AST: 21 U/L (ref 15–41)
Albumin: 3.9 g/dL (ref 3.5–5.0)
Alkaline Phosphatase: 65 U/L (ref 38–126)
Anion gap: 12 (ref 5–15)
BUN: 19 mg/dL (ref 6–20)
CO2: 23 mmol/L (ref 22–32)
Calcium: 8.5 mg/dL — ABNORMAL LOW (ref 8.9–10.3)
Chloride: 95 mmol/L — ABNORMAL LOW (ref 98–111)
Creatinine, Ser: 0.61 mg/dL (ref 0.44–1.00)
GFR, Estimated: >60 mL/min (ref >60)
Glucose, Bld: 327 mg/dL — ABNORMAL HIGH (ref 70–99)
Potassium: 4.1 mmol/L (ref 3.5–5.1)
Sodium: 130 mmol/L — ABNORMAL LOW (ref 135–145)
Total Bilirubin: 1.4 mg/dL — ABNORMAL HIGH (ref 0.3–1.2)
Total Protein: 7.4 g/dL (ref 6.5–8.1)

## 2023-10-06 LAB — CBC WITH DIFFERENTIAL/PLATELET
Abs Immature Granulocytes: 0.07 10*3/uL (ref 0.00–0.07)
Basophils Absolute: 0 10*3/uL (ref 0.0–0.1)
Basophils Relative: 0 %
Eosinophils Absolute: 0.1 10*3/uL (ref 0.0–0.5)
Eosinophils Relative: 0 %
HCT: 39.6 % (ref 36.0–46.0)
Hemoglobin: 13.5 g/dL (ref 12.0–15.0)
Immature Granulocytes: 1 %
Lymphocytes Relative: 14 %
Lymphs Abs: 1.6 10*3/uL (ref 0.7–4.0)
MCH: 29.6 pg (ref 26.0–34.0)
MCHC: 34.1 g/dL (ref 30.0–36.0)
MCV: 86.8 fL (ref 80.0–100.0)
Monocytes Absolute: 0.4 10*3/uL (ref 0.1–1.0)
Monocytes Relative: 4 %
Neutro Abs: 9.4 10*3/uL — ABNORMAL HIGH (ref 1.7–7.7)
Neutrophils Relative %: 81 %
Platelets: 316 10*3/uL (ref 150–400)
RBC: 4.56 MIL/uL (ref 3.87–5.11)
RDW: 14.3 % (ref 11.5–15.5)
WBC: 11.5 10*3/uL — ABNORMAL HIGH (ref 4.0–10.5)
nRBC: 0 % (ref 0.0–0.2)

## 2023-10-06 MED ORDER — ALUM & MAG HYDROXIDE-SIMETH 200-200-20 MG/5ML PO SUSP
30.0000 mL | Freq: Once | ORAL | Status: AC
  Administered 2023-10-06: 30 mL via ORAL
  Filled 2023-10-06: qty 30

## 2023-10-06 MED ORDER — LORAZEPAM 2 MG/ML IJ SOLN
0.5000 mg | Freq: Once | INTRAMUSCULAR | Status: AC
  Administered 2023-10-06: 0.5 mg via INTRAVENOUS
  Filled 2023-10-06: qty 1

## 2023-10-06 MED ORDER — LIDOCAINE VISCOUS HCL 2 % MT SOLN
15.0000 mL | Freq: Once | OROMUCOSAL | Status: AC
  Administered 2023-10-06: 15 mL via ORAL
  Filled 2023-10-06: qty 15

## 2023-10-06 MED ORDER — FAMOTIDINE IN NACL 20-0.9 MG/50ML-% IV SOLN
20.0000 mg | Freq: Once | INTRAVENOUS | Status: AC
  Administered 2023-10-06: 20 mg via INTRAVENOUS
  Filled 2023-10-06: qty 50

## 2023-10-06 MED ORDER — DROPERIDOL 2.5 MG/ML IJ SOLN
1.2500 mg | Freq: Once | INTRAMUSCULAR | Status: AC
  Administered 2023-10-06: 1.25 mg via INTRAVENOUS
  Filled 2023-10-06: qty 2

## 2023-10-06 NOTE — ED Provider Notes (Signed)
EMERGENCY DEPARTMENT AT [facility] Provider Note  CSN: [id number] Arrival date & time: 10/06/23 0243  Chief Complaint(s) Pain  HPI [name] [name] is a 62 y.o. female with PMH type 1 diabetes, CHF, GERD, HTN, HLD, recent hospital admission with EGD showing gastritis, ER presentation yesterday for abdominal pain who presents to the emergency department for evaluation of recurrent abdominal pain.  Endorses epigastric abdominal pain and symptoms only minimally improved with Carafate at home.  Denies chest pain, shortness of breath, headache, fever or other systemic symptoms.   Past Medical History Past Medical History:  Diagnosis Date   Allergy    seasonal   Anxiety    B12 deficiency    Chest pain 04/05/2017   CHF (congestive heart failure) ([name])    Diabetes mellitus type 1, uncontrolled    "dr. 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   Gastritis and gastroduodenitis 09/24/2023   Multiple duodenal ulcers 09/24/2023   Esophagitis 09/23/2023   Abdominal pain, bilateral upper quadrant 09/23/2023   Intractable nausea 09/22/2023   Acute pancreatitis 09/12/2023   Hyperglycemia 09/02/2023   DKA (diabetic ketoacidosis) ([name]) 08/01/2023   Intractable vomiting with nausea 06/14/2023   Orthostatic hypotension 05/14/2023   non obstructive CAD (coronary artery disease) 05/14/2023   Urinary retention 05/14/2023   Uncontrolled  type 1 diabetes mellitus with hyperglycemia, with long-term current use of insulin ([name]) 05/14/2023   UTI (urinary tract infection) 05/14/2023   Abnormal CT scan, esophagus 02/05/2023   Gastroparesis 01/07/2023   Chronic pain of both knees 08/20/2022   Intractable nausea and vomiting 03/27/2022   Marijuana abuse 03/27/2022   Bladder wall thickening 03/09/2021   Chronic orthostatic hypotension 03/06/2021   Gastric erosion    Urinary frequency 02/10/2020   Chronic diastolic HF (heart failure) ([name]) 10/12/2019   Duodenal ulcer without hemorrhage or perforation    Acute esophagitis    Constipation    Nausea and vomiting 03/25/2019   MDD (major depressive disorder), recurrent episode, moderate ([name]) 07/01/2018   Dehydration 05/26/2018   Chronic chest pain    Noncompliance 01/14/2018   Migraines 12/11/2017   Phrygian cap 06/20/2017   Leukocytosis    NSTEMI (non-ST elevated myocardial infarction) ([name]) 02/11/2017   Paroxysmal A-fib ([name]) 02/01/2015   Kidney disease, chronic, 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 Authorizing Provider  aspirin EC 81 MG tablet Take 81 mg by mouth daily. Swallow whole.    [provider]  Continuous Blood Gluc Sensor (DEXCOM G6 SENSOR) MISC INJECT 1  SENSOR INTO SKIN EVERY 10 DAYS Patient taking differently: Inject 1 Device into the skin See admin instructions. Place 1 new device into the skin every 10 days 07/18/22   Excell Seltzer, MD  Continuous Blood Gluc Transmit (DEXCOM G6 TRANSMITTER) MISC USE AS DIRECTED FOR CONTINUOUS GLUCOSE MONITORING. REUSE TRANSMITTER X  90DAYS THEN DISCARD & REPLACE 07/05/21   Bedsole, Amy E, MD  escitalopram (LEXAPRO) 10 MG tablet Take 10 mg by mouth daily. 09/09/23   [provider]  Glucagon, rDNA, (GLUCAGON EMERGENCY) 1 MG KIT Inject 1 mg into the skin See admin instructions. Inject one mg into the skin see administration instructions. Follow package directions for low blood sugar. 06/18/23   [provider]  HYDROcodone-acetaminophen (NORCO/VICODIN) 5-325 MG tablet Take 1 tablet by mouth every 4 (four) hours as needed for moderate pain. Patient not taking: Reported on 09/22/2023 09/21/23   Gilda Crease, MD  hyoscyamine (LEVSIN/SL) 0.125 MG SL tablet Place 1 tablet (0.125 mg total) under the tongue every 4 (four) hours as needed. Patient not taking: Reported on 09/22/2023 09/21/23   Gilda Crease, MD  levothyroxine (SYNTHROID) 137 MCG tablet Take 137 mcg by mouth daily before breakfast. 03/19/22   [provider]  metoCLOPramide (REGLAN) 5 MG tablet Take 1 tablet (5 mg total) by mouth 3 (three) times daily before meals. Patient taking differently: Take 5 mg by mouth as needed for nausea or vomiting. 02/06/23   Albertine Grates, MD  midodrine (PROAMATINE) 5 MG tablet Take 1 tablet (5 mg total) by mouth 2 (two) times daily with a meal. Hold if your systolic blood pressure  ( top number) is > , please check your blood pressure at home once a day and bring in record for your doctor to review, follow up with your pcp and cardiology regarding your blood pressure medication Patient taking differently: Take 5 mg by mouth daily. 02/06/23   Albertine Grates, MD  NOVOLOG FLEXPEN 100 UNIT/ML FlexPen Inject 4 Units into the skin 3 (three) times daily with meals.    [provider]  omega-3 acid ethyl esters (LOVAZA) 1 g capsule Take 2 capsules (2 g total) by mouth 2 (two) times daily. 09/13/23   Alwyn Ren, MD  ondansetron (ZOFRAN) 4 MG tablet Take 1 tablet (4 mg total) by mouth every 6 (six)  hours as needed for nausea. 09/13/23   Alwyn Ren, MD  pantoprazole (PROTONIX) 40 MG tablet Take 1 tablet (40 mg total) by mouth 2 (two) times daily. 09/25/23 10/25/23  Tyrone Nine, MD  promethazine (PHENERGAN) 25 MG suppository Place 1 suppository (25 mg total) rectally every 6 (six) hours as needed for nausea or vomiting. 10/05/23   Renne Crigler, PA-C  promethazine (PHENERGAN) 25 MG tablet Take 1 tablet (25 mg total) by mouth every 6 (six) hours as needed for nausea or vomiting. 10/05/23   Renne Crigler, PA-C  QUEtiapine (SEROQUEL) 50 MG tablet Take 50 mg by mouth daily. 09/09/23   [provider]  ranolazine (RANEXA) 500 MG 12 hr tablet Take 500 mg by mouth 2 (two) times daily. 07/23/23   [provider]  rosuvastatin (CRESTOR) 10 MG tablet Take 10 mg by mouth daily. 06/17/23   [provider]  sucralfate (CARAFATE) 1 g tablet Take 1 tablet (1 g total) by mouth 4 (four) times daily -  with meals and at bedtime for 14 days. 09/25/23 10/09/23  Tyrone Nine, MD  tamsulosin (FLOMAX) 0.4 MG CAPS capsule Take  1 capsule (0.4 mg total) by mouth daily after supper. 09/04/23   Arnetha Courser, MD  TOUJEO SOLOSTAR 300 UNIT/ML Solostar Pen Inject 12 Units into the skin at bedtime. Patient taking differently: Inject 8 Units into the skin at bedtime. 08/24/23   Lewie Chamber, MD  traZODone (DESYREL) 50 MG tablet Take 50 mg by mouth at bedtime.    [provider]                                                                                                                                    Past Surgical History Past Surgical History:  Procedure Laterality Date   BIOPSY  06/03/2019   Procedure: BIOPSY;  Surgeon: Lynann Bologna, MD;  Location: [facility];  Service: Endoscopy;;   BIOPSY  08/17/2020   Procedure: BIOPSY;  Surgeon: Tressia Danas, MD;  Location: [facility];  Service: Gastroenterology;;   BIOPSY  02/05/2023   Procedure: BIOPSY;  Surgeon: Meryl Dare, MD;  Location: [facility];  Service: Gastroenterology;;   BIOPSY  09/24/2023   Procedure: BIOPSY;  Surgeon: Lemar Lofty., MD;  Location: [facility];  Service: Gastroenterology;;   CESAREAN SECTION  1989   ENTEROSCOPY N/A 06/03/2019   Procedure: ENTEROSCOPY;  Surgeon: Lynann Bologna, MD;  Location: [facility];  Service: Endoscopy;  Laterality: N/A;   ESOPHAGOGASTRODUODENOSCOPY (EGD) WITH PROPOFOL N/A 08/17/2020   Procedure: ESOPHAGOGASTRODUODENOSCOPY (EGD) WITH PROPOFOL;  Surgeon: Tressia Danas, MD;  Location: [facility];  Service: Gastroenterology;  Laterality: N/A;   ESOPHAGOGASTRODUODENOSCOPY (EGD) WITH PROPOFOL N/A 02/05/2023   Procedure: ESOPHAGOGASTRODUODENOSCOPY (EGD) WITH PROPOFOL;  Surgeon: Meryl Dare, MD;  Location: [facility];  Service: Gastroenterology;  Laterality: N/A;   ESOPHAGOGASTRODUODENOSCOPY (EGD) WITH PROPOFOL N/A 09/24/2023   Procedure: ESOPHAGOGASTRODUODENOSCOPY (EGD) WITH PROPOFOL;  Surgeon: Meridee Score Netty Starring., MD;  Location: [facility];  Service: Gastroenterology;  Laterality: N/A;   EUS N/A 09/24/2023   Procedure: UPPER ENDOSCOPIC ULTRASOUND (EUS) LINEAR;  Surgeon: Lemar Lofty., MD;  Location: [facility];  Service: Gastroenterology;  Laterality: N/A;   LAPAROSCOPIC ASSISTED VAGINAL HYSTERECTOMY  09/23/2012   Procedure: LAPAROSCOPIC ASSISTED VAGINAL HYSTERECTOMY;  Surgeon: Mitchel Honour, DO;  Location: [facility];  Service: Gynecology;  Laterality: N/A;  pull Dr Vance Gather instrument   LEFT HEART CATH AND CORONARY ANGIOGRAPHY N/A 02/11/2017   Procedure: Left Heart Cath and Coronary Angiography;  Surgeon: Runell Gess, MD;  Location: [facility] INVASIVE CV LAB;  Service: Cardiovascular;  Laterality: N/A;   TUBAL LIGATION Bilateral 1992   Family History Family History  Problem Relation Age of Onset   Diabetes Other    Heart disease Other    Heart failure Mother 106       Her heart skipped a beat and stopped - per pt   Diabetes  Maternal Grandmother    Alzheimer's disease Maternal Grandmother    Coronary artery disease Maternal Grandmother    Heart attack Maternal Grandfather  Heart failure Brother    Diabetes Brother    Colon polyps Neg Hx    Esophageal cancer Neg Hx    Liver cancer Neg Hx    Pancreatic cancer Neg Hx    Stomach cancer Neg Hx    Crohn's disease Neg Hx    Rectal cancer Neg Hx     Social History Social History   Tobacco Use   Smoking status: Former    Current packs/day: 0.00    Average packs/day: 0.1 packs/day for 10.0 years (1.2 ttl pk-yrs)    Types: Cigarettes    Start date: 12/10/1980    Quit date: 12/10/1990    Years since quitting: 32.8   Smokeless tobacco: Never  Vaping Use   Vaping status: Never Used  Substance Use Topics   Alcohol use: Never    Alcohol/week: 0.0 standard drinks of alcohol   Drug use: Not Currently    Types: Marijuana    Comment: last use 2 days ago   Allergies Codeine  Review of Systems Review of Systems  Gastrointestinal:  Positive for abdominal pain, nausea and vomiting.    Physical Exam Vital Signs  I have reviewed the triage vital signs BP (!) 146/106   Pulse 99   Temp 98.3 F (36.8 C) (Oral)   Resp 20   Ht 4\' 11"  (1.499 m)   Wt 56.2 kg   LMP 08/11/2012   SpO2 98%   BMI 25.02 kg/m   Physical Exam Vitals and nursing note reviewed.  Constitutional:      General: She is not in acute distress.    Appearance: She is well-developed.  HENT:     Head: Normocephalic and atraumatic.  Eyes:     Conjunctiva/sclera: Conjunctivae normal.  Cardiovascular:     Rate and Rhythm: Normal rate and regular rhythm.     Heart sounds: No murmur heard. Pulmonary:     Effort: Pulmonary effort is normal. No respiratory distress.     Breath sounds: Normal breath sounds.  Abdominal:     Palpations: Abdomen is soft.     Tenderness: There is abdominal tenderness.  Musculoskeletal:        General: No swelling.     Cervical back: Neck supple.  Skin:     General: Skin is warm and dry.     Capillary Refill: Capillary refill takes less than 2 seconds.  Neurological:     Mental Status: She is alert.  Psychiatric:        Mood and Affect: Mood normal.     ED Results and Treatments Labs (all labs ordered are listed, but only abnormal results are displayed) Labs Reviewed  COMPREHENSIVE METABOLIC PANEL - Abnormal; Notable for the following components:      Result Value   Sodium 130 (*)    Chloride 95 (*)    Glucose, Bld 327 (*)    Calcium 8.5 (*)    Total Bilirubin 1.4 (*)    All other components within normal limits  CBC WITH DIFFERENTIAL/PLATELET - Abnormal; Notable for the following components:   WBC 11.5 (*)    Neutro Abs 9.4 (*)    All other components within normal limits  Radiology No results found.  Pertinent labs & imaging results that were available during my care of the patient were reviewed by me and considered in my medical decision making (see MDM for details).  Medications Ordered in ED Medications  famotidine (PEPCID) IVPB 20 mg premix (0 mg Intravenous Stopped 10/06/23 0416)  alum & mag hydroxide-simeth (MAALOX/MYLANTA) 200-200-20 MG/5ML suspension 30 mL (30 mLs Oral Given 10/06/23 0339)    And  lidocaine (XYLOCAINE) 2 % viscous mouth solution 15 mL (15 mLs Oral Given 10/06/23 0339)  droperidol (INAPSINE) 2.5 MG/ML injection 1.25 mg (1.25 mg Intravenous Given 10/06/23 0434)  LORazepam (ATIVAN) injection 0.5 mg (0.5 mg Intravenous Given 10/06/23 0434)                                                                                                                                     Procedures Procedures  (including critical care time)  Medical Decision Making / ED Course   This patient presents to the ED for concern of abdominal pain, this involves an extensive number of treatment options,  and is a complaint that carries with it a high risk of complications and morbidity.  The differential diagnosis includes GERD/gastritis, peptic ulcer disease, pancreatitis, gastroparesis, pneumonia, pleurisy, pericarditis,    MDM: Patient seen emergency room for evaluation of epigastric abdominal pain and difficulty tolerating p.o.  Physical exam with tenderness in the epigastrium but is otherwise unremarkable.  Laboratory evaluation with a mild leukocytosis to 11.5 which is improved from evaluation 23 hours ago, glucose 327, sodium 130.  Patient given Pepcid and Maalox and symptoms minimally improved but patient endorsing persistent symptoms.  She has known history of uncontrolled type 1 diabetes and I do have concern that patient's pain may be secondary to gastroparesis today and thus we will trial droperidol and Ativan.  On reevaluation, her symptoms have significantly improved and she is now able to tolerate p.o.  She has had extensive CT imaging for similar complaints that have not changed management in the past and thus we will defer CT imaging today.  As her symptoms have improved and she is able to tolerate p.o., she currently does not meet inpatient criteria for admission and will be discharged with outpatient follow-up.  Given return precautions of which she voiced understanding.    Additional history obtained:  -External records from outside source obtained and reviewed including: Chart review including previous notes, labs, imaging, consultation notes   Lab Tests: -I ordered, reviewed, and interpreted labs.   The pertinent results include:   Labs Reviewed  COMPREHENSIVE METABOLIC PANEL - Abnormal; Notable for the following components:      Result Value   Sodium 130 (*)    Chloride 95 (*)    Glucose, Bld 327 (*)    Calcium 8.5 (*)    Total Bilirubin 1.4 (*)    All other components within normal limits  CBC WITH DIFFERENTIAL/PLATELET - Abnormal;  Notable for the following components:    WBC 11.5 (*)    Neutro Abs 9.4 (*)    All other components within normal limits     Medicines ordered and prescription drug management: Meds ordered this encounter  Medications   famotidine (PEPCID) IVPB 20 mg premix   AND Linked Order Group    alum & mag hydroxide-simeth (MAALOX/MYLANTA) 200-200-20 MG/5ML suspension 30 mL    lidocaine (XYLOCAINE) 2 % viscous mouth solution 15 mL   droperidol (INAPSINE) 2.5 MG/ML injection 1.25 mg   LORazepam (ATIVAN) injection 0.5 mg    -I have reviewed the patients home medicines and have made adjustments as needed  Critical interventions none   Cardiac Monitoring: The patient was maintained on a cardiac monitor.  I personally viewed and interpreted the cardiac monitored which showed an underlying rhythm of: NSR  Social Determinants of Health:  Factors impacting patients care include: none   Reevaluation: After the interventions noted above, I reevaluated the patient and found that they have :improved  Co morbidities that complicate the patient evaluation  Past Medical History:  Diagnosis Date   Allergy    seasonal   Anxiety    B12 deficiency    Chest pain 04/05/2017   CHF (congestive heart failure) ([name])    Diabetes mellitus type 1, uncontrolled    "dr. 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  Return  precautions given of which she voiced understanding.     Final Clinical Impression(s) / ED Diagnoses Final diagnoses:  None     @PCDICTATION @    Glendora Score, MD 10/06/23 [phone]

## 2023-10-06 NOTE — ED Notes (Signed)
Pt given water for PO challenge. RN will re-assess.

## 2023-10-06 NOTE — ED Triage Notes (Signed)
Pt BIB [facility] d/t needing pain meds.  She was just discharged for ABD / CP and given PO pain meds but they are not helping her.

## 2023-10-17 ENCOUNTER — Emergency Department (HOSPITAL_COMMUNITY)
Admission: EM | Admit: 2023-10-17 | Discharge: 2023-10-17 | Disposition: A | Discharge status: Home / Self Care | Payer: BC Managed Care – PPO | Attending: Emergency Medicine | Admitting: Emergency Medicine

## 2023-10-17 ENCOUNTER — Encounter (HOSPITAL_COMMUNITY): Payer: Self-pay

## 2023-10-17 ENCOUNTER — Other Ambulatory Visit: Payer: Self-pay

## 2023-10-17 DIAGNOSIS — I509 Heart failure, unspecified: Secondary | ICD-10-CM | POA: Diagnosis not present

## 2023-10-17 DIAGNOSIS — D72829 Elevated white blood cell count, unspecified: Secondary | ICD-10-CM | POA: Diagnosis not present

## 2023-10-17 DIAGNOSIS — R739 Hyperglycemia, unspecified: Secondary | ICD-10-CM | POA: Diagnosis not present

## 2023-10-17 DIAGNOSIS — N189 Chronic kidney disease, unspecified: Secondary | ICD-10-CM | POA: Insufficient documentation

## 2023-10-17 DIAGNOSIS — R1111 Vomiting without nausea: Secondary | ICD-10-CM | POA: Diagnosis not present

## 2023-10-17 DIAGNOSIS — R1013 Epigastric pain: Secondary | ICD-10-CM | POA: Diagnosis not present

## 2023-10-17 DIAGNOSIS — R109 Unspecified abdominal pain: Secondary | ICD-10-CM | POA: Diagnosis not present

## 2023-10-17 DIAGNOSIS — E1122 Type 2 diabetes mellitus with diabetic chronic kidney disease: Secondary | ICD-10-CM | POA: Insufficient documentation

## 2023-10-17 DIAGNOSIS — R1084 Generalized abdominal pain: Secondary | ICD-10-CM | POA: Diagnosis not present

## 2023-10-17 DIAGNOSIS — R000 Tachycardia, unspecified: Secondary | ICD-10-CM | POA: Diagnosis not present

## 2023-10-17 DIAGNOSIS — Z7982 Long term (current) use of aspirin: Secondary | ICD-10-CM | POA: Insufficient documentation

## 2023-10-17 DIAGNOSIS — Z794 Long term (current) use of insulin: Secondary | ICD-10-CM | POA: Insufficient documentation

## 2023-10-17 DIAGNOSIS — E1165 Type 2 diabetes mellitus with hyperglycemia: Secondary | ICD-10-CM | POA: Insufficient documentation

## 2023-10-17 DIAGNOSIS — E039 Hypothyroidism, unspecified: Secondary | ICD-10-CM | POA: Insufficient documentation

## 2023-10-17 LAB — CBC
HCT: 43.3 % (ref 36.0–46.0)
Hemoglobin: 14.4 g/dL (ref 12.0–15.0)
MCH: 28.7 pg (ref 26.0–34.0)
MCHC: 33.3 g/dL (ref 30.0–36.0)
MCV: 86.4 fL (ref 80.0–100.0)
Platelets: 452 10*3/uL — ABNORMAL HIGH (ref 150–400)
RBC: 5.01 MIL/uL (ref 3.87–5.11)
RDW: 14.3 % (ref 11.5–15.5)
WBC: 13.5 10*3/uL — ABNORMAL HIGH (ref 4.0–10.5)
nRBC: 0 % (ref 0.0–0.2)

## 2023-10-17 LAB — BLOOD GAS, VENOUS
Acid-Base Excess: 4.4 mmol/L — ABNORMAL HIGH (ref 0.0–2.0)
Bicarbonate: 29.2 mmol/L — ABNORMAL HIGH (ref 20.0–28.0)
O2 Saturation: 93.8 %
Patient temperature: 37
pCO2, Ven: 43 mm[Hg] — ABNORMAL LOW (ref 44–60)
pH, Ven: 7.44 — ABNORMAL HIGH (ref 7.25–7.43)
pO2, Ven: 66 mm[Hg] — ABNORMAL HIGH (ref 32–45)

## 2023-10-17 LAB — URINALYSIS, ROUTINE W REFLEX MICROSCOPIC
Bilirubin Urine: NEGATIVE
Glucose, UA: >=500 mg/dL — AB
Hgb urine dipstick: NEGATIVE
Ketones, ur: 20 mg/dL — AB
Leukocytes,Ua: NEGATIVE
Nitrite: NEGATIVE
Protein, ur: NEGATIVE mg/dL
Specific Gravity, Urine: 1.016 (ref 1.005–1.030)
pH: 6 (ref 5.0–8.0)

## 2023-10-17 LAB — CBG MONITORING, ED
Glucose-Capillary: 497 mg/dL — ABNORMAL HIGH (ref 70–99)
Glucose-Capillary: 376 mg/dL — ABNORMAL HIGH (ref 70–99)
Glucose-Capillary: 237 mg/dL — ABNORMAL HIGH (ref 70–99)

## 2023-10-17 LAB — COMPREHENSIVE METABOLIC PANEL
ALT: 17 U/L (ref 0–44)
AST: 15 U/L (ref 15–41)
Albumin: 4 g/dL (ref 3.5–5.0)
Alkaline Phosphatase: 76 U/L (ref 38–126)
Anion gap: 18 — ABNORMAL HIGH (ref 5–15)
BUN: 33 mg/dL — ABNORMAL HIGH (ref 6–20)
CO2: 23 mmol/L (ref 22–32)
Calcium: 9.1 mg/dL (ref 8.9–10.3)
Chloride: 86 mmol/L — ABNORMAL LOW (ref 98–111)
Creatinine, Ser: 1.08 mg/dL — ABNORMAL HIGH (ref 0.44–1.00)
GFR, Estimated: 59 mL/min — ABNORMAL LOW (ref >60)
Glucose, Bld: 479 mg/dL — ABNORMAL HIGH (ref 70–99)
Potassium: 3.3 mmol/L — ABNORMAL LOW (ref 3.5–5.1)
Sodium: 127 mmol/L — ABNORMAL LOW (ref 135–145)
Total Bilirubin: 1 mg/dL (ref <1.2)
Total Protein: 7.5 g/dL (ref 6.5–8.1)

## 2023-10-17 LAB — LIPASE, BLOOD: Lipase: 79 U/L — ABNORMAL HIGH (ref 11–51)

## 2023-10-17 MED ORDER — ALUM & MAG HYDROXIDE-SIMETH 200-200-20 MG/5ML PO SUSP
30.0000 mL | Freq: Once | ORAL | Status: AC
  Administered 2023-10-17: 30 mL via ORAL
  Filled 2023-10-17: qty 30

## 2023-10-17 MED ORDER — INSULIN ASPART 100 UNIT/ML IJ SOLN
8.0000 [IU] | Freq: Once | INTRAMUSCULAR | Status: AC
  Administered 2023-10-17: 8 [IU] via SUBCUTANEOUS
  Filled 2023-10-17: qty 0.08

## 2023-10-17 MED ORDER — SODIUM CHLORIDE 0.9 % IV BOLUS
1000.0000 mL | Freq: Once | INTRAVENOUS | Status: AC
  Administered 2023-10-17: 1000 mL via INTRAVENOUS

## 2023-10-17 MED ORDER — MORPHINE SULFATE (PF) 2 MG/ML IV SOLN
2.0000 mg | Freq: Once | INTRAVENOUS | Status: AC
  Administered 2023-10-17: 2 mg via INTRAVENOUS
  Filled 2023-10-17: qty 1

## 2023-10-17 MED ORDER — MORPHINE SULFATE (PF) 4 MG/ML IV SOLN: 4.0000 mg | Freq: Once | INTRAVENOUS | Status: DC

## 2023-10-17 MED ORDER — ONDANSETRON 4 MG PO TBDP: 4.0000 mg | ORAL_TABLET | Freq: Three times a day (TID) | ORAL | 0 refills | Status: DC | PRN

## 2023-10-17 MED ORDER — DICYCLOMINE HCL 20 MG PO TABS: 20.0000 mg | ORAL_TABLET | Freq: Two times a day (BID) | ORAL | 0 refills | Status: DC

## 2023-10-17 MED ORDER — LIDOCAINE VISCOUS HCL 2 % MT SOLN
15.0000 mL | Freq: Once | OROMUCOSAL | Status: AC
  Administered 2023-10-17: 15 mL via ORAL
  Filled 2023-10-17: qty 15

## 2023-10-17 MED ORDER — ONDANSETRON HCL 4 MG/2ML IJ SOLN
4.0000 mg | Freq: Once | INTRAMUSCULAR | Status: AC
  Administered 2023-10-17: 4 mg via INTRAVENOUS
  Filled 2023-10-17: qty 2

## 2023-10-17 MED ORDER — MORPHINE SULFATE (PF) 4 MG/ML IV SOLN
4.0000 mg | Freq: Once | INTRAVENOUS | Status: AC
  Administered 2023-10-17: 4 mg via INTRAVENOUS
  Filled 2023-10-17: qty 1

## 2023-10-17 MED ORDER — ONDANSETRON HCL 4 MG/2ML IJ SOLN
4.0000 mg | Freq: Once | INTRAMUSCULAR | Status: AC
Start: 2023-10-17 — End: 2023-10-17
  Administered 2023-10-17: 4 mg via INTRAVENOUS
  Filled 2023-10-17: qty 2

## 2023-10-17 MED ORDER — INSULIN ASPART 100 UNIT/ML IJ SOLN
8.0000 [IU] | Freq: Once | INTRAMUSCULAR | Status: AC
  Administered 2023-10-17: 8 [IU] via INTRAVENOUS
  Filled 2023-10-17: qty 0.08

## 2023-10-17 NOTE — ED Triage Notes (Signed)
Pt BIBA for vomiting since yesterday and high cbg 489. 20ga RH, fentanyl. Feels like pancreatitis flareup. Guarding  145/79 HR 99 100% RA

## 2023-10-17 NOTE — ED Provider Notes (Signed)
Signout received on this 62 year old female who is pending reevaluation.  Please see previous note for full details.  There is low suspicion for DKA given the VBG without acidosis.  She has had multiple CT scans in the recent past without any acute finding. Physical Exam  BP 92/62 (BP Location: Right Arm)   Pulse 79   Temp 98 F (36.7 C) (Oral)   Resp 16   Ht 4\' 11"  (1.499 m)   Wt 56.2 kg   LMP 08/11/2012   SpO2 100%   BMI 25.02 kg/m     Procedures  Procedures  ED Course / MDM    Medical Decision Making Amount and/or Complexity of Data Reviewed Labs: ordered.  Risk OTC drugs. Prescription drug management.   On reevaluation patient is resting comfortably.  She is agreeable with discharge but would like 1 additional dose of pain medication.  Will give 1 additional pain medication.  Discussed follow-up with her PCP and gastroenterologist.  She is in agreement.       Marita Kansas, PA-C 10/17/23 0920    Loetta Rough, MD 10/17/23 1026

## 2023-10-17 NOTE — Discharge Instructions (Signed)
Follow-up with your primary care provider and gastroenterologist.  Your workup was overall reassuring.  I have sent nausea medication as well as pain medication into the pharmacy for you.

## 2023-10-17 NOTE — ED Provider Notes (Signed)
[place] EMERGENCY DEPARTMENT AT [facility] Provider Note   CSN: [id number] Arrival date & time: 10/17/23  3664     History  Chief Complaint  Patient presents with   Abdominal Pain   Hyperglycemia    [name] [name] is a 62 y.o. female with PMH significant for hypothyroidism, B12 deficiency, GERD, CKD, NSTEMI, MDD, CHF, abdominal pain, diabetes, nausea and vomiting. Reports feeling like pancreatitis flareup.  Patient seen multiple times for the last month for abdominal pain.  Reports it feels similar to her normal presentation.  Reports she has been taking her diabetes medication like normal and is unsure why her blood sugar is so elevated.   Abdominal Pain Hyperglycemia Associated symptoms: abdominal pain        Home Medications Prior to Admission medications   Medication Sig Start Date End Date Taking? Authorizing Provider  aspirin EC 81 MG tablet Take 81 mg by mouth daily. Swallow whole.    [provider]  Continuous Blood Gluc Sensor (DEXCOM G6 SENSOR) MISC INJECT 1 SENSOR INTO SKIN EVERY 10 DAYS Patient taking differently: Inject 1 Device into the skin See admin instructions. Place 1 new device into the skin every 10 days 07/18/22   Excell Seltzer, MD  Continuous Blood Gluc Transmit (DEXCOM G6 TRANSMITTER) MISC USE AS DIRECTED FOR CONTINUOUS GLUCOSE MONITORING. REUSE TRANSMITTER X 90DAYS THEN DISCARD & REPLACE 07/05/21   Bedsole, Amy E, MD  escitalopram (LEXAPRO) 10 MG tablet Take 10 mg by mouth daily. 09/09/23   [provider]  Glucagon, rDNA, (GLUCAGON EMERGENCY) 1 MG KIT Inject 1 mg into the skin See admin instructions. Inject one mg into the skin see administration instructions. Follow package directions for low blood sugar. 06/18/23   [provider]  HYDROcodone-acetaminophen (NORCO/VICODIN) 5-325 MG tablet Take 1 tablet by mouth every 4 (four) hours as needed for moderate pain. Patient not taking: Reported on 09/22/2023 09/21/23    Gilda Crease, MD  hyoscyamine (LEVSIN/SL) 0.125 MG SL tablet Place 1 tablet (0.125 mg total) under the tongue every 4 (four) hours as needed. Patient not taking: Reported on 09/22/2023 09/21/23   Gilda Crease, MD  levothyroxine (SYNTHROID) 137 MCG tablet Take 137 mcg by mouth daily before breakfast. 03/19/22   [provider]  metoCLOPramide (REGLAN) 5 MG tablet Take 1 tablet (5 mg total) by mouth 3 (three) times daily before meals. Patient taking differently: Take 5 mg by mouth as needed for nausea or vomiting. 02/06/23   Albertine Grates, MD  midodrine (PROAMATINE) 5 MG tablet Take 1 tablet (5 mg total) by mouth 2 (two) times daily with a meal. Hold if your systolic blood pressure  ( top number) is > , please check your blood pressure at home once a day and bring in record for your doctor to review, follow up with your pcp and cardiology regarding your blood pressure medication Patient taking differently: Take 5 mg by mouth daily. 02/06/23   Albertine Grates, MD  NOVOLOG FLEXPEN 100 UNIT/ML FlexPen Inject 4 Units into the skin 3 (three) times daily with meals.    [provider]  omega-3 acid ethyl esters (LOVAZA) 1 g capsule Take 2 capsules (2 g total) by mouth 2 (two) times daily. 09/13/23   Alwyn Ren, MD  ondansetron (ZOFRAN) 4 MG tablet Take 1 tablet (4 mg total) by mouth every 6 (six) hours as needed for nausea. 09/13/23   Alwyn Ren, MD  pantoprazole (PROTONIX) 40 MG tablet Take  1 tablet (40 mg total) by mouth 2 (two) times daily. 09/25/23 10/25/23  Tyrone Nine, MD  promethazine (PHENERGAN) 25 MG suppository Place 1 suppository (25 mg total) rectally every 6 (six) hours as needed for nausea or vomiting. 10/05/23   Renne Crigler, PA-C  promethazine (PHENERGAN) 25 MG tablet Take 1 tablet (25 mg total) by mouth every 6 (six) hours as needed for nausea or vomiting. 10/05/23   Renne Crigler, PA-C  QUEtiapine (SEROQUEL) 50 MG tablet Take 50 mg by mouth  daily. 09/09/23   [provider]  ranolazine (RANEXA) 500 MG 12 hr tablet Take 500 mg by mouth 2 (two) times daily. 07/23/23   [provider]  rosuvastatin (CRESTOR) 10 MG tablet Take 10 mg by mouth daily. 06/17/23   [provider]  sucralfate (CARAFATE) 1 g tablet Take 1 tablet (1 g total) by mouth 4 (four) times daily -  with meals and at bedtime for 14 days. 09/25/23 10/09/23  Tyrone Nine, MD  tamsulosin (FLOMAX) 0.4 MG CAPS capsule Take 1 capsule (0.4 mg total) by mouth daily after supper. 09/04/23   Arnetha Courser, MD  TOUJEO SOLOSTAR 300 UNIT/ML Solostar Pen Inject 12 Units into the skin at bedtime. Patient taking differently: Inject 8 Units into the skin at bedtime. 08/24/23   Lewie Chamber, MD  traZODone (DESYREL) 50 MG tablet Take 50 mg by mouth at bedtime.    [provider]      Allergies    Codeine    Review of Systems   Review of Systems  Gastrointestinal:  Positive for abdominal pain.  All other systems reviewed and are negative.   Physical Exam Updated Vital Signs BP 126/68 (BP Location: Left Arm)   Pulse 98   Temp 98 F (36.7 C) (Oral)   Resp 20   Ht 4\' 11"  (1.499 m)   Wt 56.2 kg   LMP 08/11/2012   SpO2 98%   BMI 25.02 kg/m  Physical Exam Vitals and nursing note reviewed.  Constitutional:      General: She is not in acute distress.    Appearance: Normal appearance.  HENT:     Head: Normocephalic and atraumatic.  Eyes:     General:        Right eye: No discharge.        Left eye: No discharge.  Cardiovascular:     Rate and Rhythm: Normal rate and regular rhythm.     Heart sounds: No murmur heard.    No friction rub. No gallop.  Pulmonary:     Effort: Pulmonary effort is normal.     Breath sounds: Normal breath sounds.  Abdominal:     General: Bowel sounds are normal.     Palpations: Abdomen is soft.     Comments: Patient with diffuse tenderness to palpation throughout the abdomen, some guarding, no rebound,  rigidity.  Skin:    General: Skin is warm and dry.     Capillary Refill: Capillary refill takes less than 2 seconds.  Neurological:     Mental Status: She is alert and oriented to person, place, and time.  Psychiatric:        Mood and Affect: Mood normal.        Behavior: Behavior normal.     ED Results / Procedures / Treatments   Labs (all labs ordered are listed, but only abnormal results are displayed) Labs Reviewed  LIPASE, BLOOD - Abnormal; Notable for the following components:  Result Value   Lipase 79 (*)    All other components within normal limits  COMPREHENSIVE METABOLIC PANEL - Abnormal; Notable for the following components:   Sodium 127 (*)    Potassium 3.3 (*)    Chloride 86 (*)    Glucose, Bld 479 (*)    BUN 33 (*)    Creatinine, Ser 1.08 (*)    GFR, Estimated 59 (*)    Anion gap 18 (*)    All other components within normal limits  CBC - Abnormal; Notable for the following components:   WBC 13.5 (*)    Platelets 452 (*)    All other components within normal limits  BLOOD GAS, VENOUS - Abnormal; Notable for the following components:   pH, Ven 7.44 (*)    pCO2, Ven 43 (*)    pO2, Ven 66 (*)    Bicarbonate 29.2 (*)    Acid-Base Excess 4.4 (*)    All other components within normal limits  CBG MONITORING, ED - Abnormal; Notable for the following components:   Glucose-Capillary 497 (*)    All other components within normal limits  CBG MONITORING, ED - Abnormal; Notable for the following components:   Glucose-Capillary 376 (*)    All other components within normal limits  URINALYSIS, ROUTINE W REFLEX MICROSCOPIC  I-STAT VENOUS BLOOD GAS, ED    EKG None  Radiology No results found.  Procedures Procedures    Medications Ordered in ED Medications  morphine (PF) 4 MG/ML injection 4 mg (4 mg Intravenous Given 10/17/23 0428)  ondansetron (ZOFRAN) injection 4 mg (4 mg Intravenous Given 10/17/23 0429)  insulin aspart (novoLOG) injection 8 Units (8  Units Intravenous Given 10/17/23 0441)  sodium chloride 0.9 % bolus 1,000 mL (0 mLs Intravenous Stopped 10/17/23 0536)  alum & mag hydroxide-simeth (MAALOX/MYLANTA) 200-200-20 MG/5ML suspension 30 mL (30 mLs Oral Given 10/17/23 0533)    And  lidocaine (XYLOCAINE) 2 % viscous mouth solution 15 mL (15 mLs Oral Given 10/17/23 0533)  insulin aspart (novoLOG) injection 8 Units (8 Units Subcutaneous Given 10/17/23 1610)    ED Course/ Medical Decision Making/ A&P                                 Medical Decision Making Amount and/or Complexity of Data Reviewed Labs: ordered.  Risk Prescription drug management.   This patient is a 62 y.o. female  who presents to the ED for concern of abdominal pain, elevated blood sugar.   Differential diagnoses prior to evaluation: The emergent differential diagnosis includes, but is not limited to,  The causes of generalized abdominal pain include but are not limited to AAA, mesenteric ischemia, appendicitis, diverticulitis, DKA, gastritis, gastroenteritis, AMI, nephrolithiasis, pancreatitis, peritonitis, adrenal insufficiency,lead poisoning, iron toxicity, intestinal ischemia, constipation, UTI,SBO/LBO, splenic rupture, biliary disease, IBD, IBS, PUD, or hepatitis. This is not an exhaustive differential.   Past Medical History / Co-morbidities / Social History: hypothyroidism, B12 deficiency, GERD, CKD, NSTEMI, MDD, CHF, abdominal pain, diabetes, nausea and vomiting  Additional history: Chart reviewed. Pertinent results include: Reviewed lab work, imaging from multiple recent previous emergency department visits, as well as hospital admission, she is seen frequently for hyperglycemia, chronic abdominal pain, pancreatitis  Physical Exam: Physical exam performed. The pertinent findings include: Vital signs stable, patient does have some diffuse abdominal tenderness, most focal in the epigastric region, no rebound, rigidity, some guarding  Lab Tests/Imaging  studies: I personally interpreted labs/imaging  and the pertinent results include: CBC notable for mild leukocytosis, blood cells 13.5, platelets also elevated at 452, likely some degree of hemoconcentration.  CMP notable for pseudohyponatremia, sodium 127 in context of glucose 479.  Mildly elevated creatinine 1.08.  She does have an anion gap of 18, suspicious for developing DKA, VBG however without any acidosis..  Considered radiographic imaging of the abdomen, however patient seen multiple times over recent weeks for similar symptoms, okay to defer at this time with reassuring abdominal exam. Suspect related to gastritis vs gastroparesis, vs GERDI agree with the radiologist interpretation.   Medications: I ordered medication including fluid bolus, insulin, morphine, Zofran for pain, nausea, elevated blood sugar.  I have reviewed the patients home medicines and have made adjustments as needed.   6:35 AM Care of Marialice C Bowring transferred to [facility] and Dr. Jearld Fenton at the end of my shift as the patient will require reassessment once labs/imaging have resulted. Patient presentation, ED course, and plan of care discussed with review of all pertinent labs and imaging. Please see his/her note for further details regarding further ED course and disposition. Plan at time of handoff is reassess after fluid resuscitation, insulin, repeat CBG , assess if glucose and abdominal pain have improved. This may be altered or completely changed at the discretion of the oncoming team pending results of further workup.  Final Clinical Impression(s) / ED Diagnoses Final diagnoses:  None    Rx / DC Orders ED Discharge Orders     None         [place] 10/17/23 0636    Palumbo, April, MD 10/17/23 [phone]

## 2023-10-20 NOTE — Progress Notes (Unsigned)
10/20/2023 [name] [name] [id number] Dec 23, 1960   Chief Complaint:  History of Present Illness: [name] [name] is a 62 year old female with a past medical history of anxiety, hypertension, CHF, DM, type I,  DKA, CKD stage II, peripheral neuropathy, hypothyroidism, esophagitis, [place] in 2018, duodenal ulcer, Schatzki's ring, dysphagia, chronic nausea and pancreatitis.   She was seen by our inpatient GI service during her hospitalization 09/22/2023  She was hyperglycemic with leukocytosis. Imaging showed normal pancreas and circumferential esophageal thickening suggestive of esophagitis. She was given IVF, subcutaneous insulin, antiemetics, PPI, carafate, and admitted. GI was consulted. EGD 10/15 showed severe esophagitis and duodenitis with duodenal ulcers. PPI augmented to BID, carafate added, and she's tolerating a diet so cleared for discharge   Last wed night vomited, went to the ED, sent he home. Got Morphine x 2. Bellybutton  below. Thurs 11/7.   From April 13 trips to ED.   She  ate dinner on Wed started feeling kind of bad. She took Zofran, didn't work. Took Phenergan po, eased up a little.  She was thirsty, got water, told husband to call ambulance, going to be sick. Vomited x 3 before EMS got there. No CGE or hematemesis. Lower abd pain.   BM a few times weekly. No blood or blood stools.  Solid brown.      Latest Ref Rng & Units 10/17/2023    3:32 AM 10/06/2023    4:23 AM 10/05/2023    7:00 AM  CBC  WBC 4.0 - 10.5 K/uL 13.5  11.5  14.9   Hemoglobin 12.0 - 15.0 g/dL 21.3  08.6  57.8   Hematocrit 36.0 - 46.0 % 43.3  39.6  42.0   Platelets 150 - 400 K/uL 452  316  405        Latest Ref Rng & Units 10/17/2023    3:32 AM 10/06/2023    4:23 AM 10/05/2023    7:00 AM  CMP  Glucose 70 - 99 mg/dL 469  629  528   BUN 6 - 20 mg/dL 33  19  19   Creatinine 0.44 - 1.00 mg/dL 4.13  2.44  0.10   Sodium 135 - 145 mmol/L 127  130  127   Potassium 3.5 - 5.1 mmol/L 3.3  4.1   4.2   Chloride 98 - 111 mmol/L 86  95  89   CO2 22 - 32 mmol/L 23  23  22    Calcium 8.9 - 10.3 mg/dL 9.1  8.5  9.0   Total Protein 6.5 - 8.1 g/dL 7.5  7.4  7.9   Total Bilirubin <1.2 mg/dL 1.0  1.4  0.9   Alkaline Phos 38 - 126 U/L 76  65  78   AST 15 - 41 U/L 15  21  16    ALT 0 - 44 U/L 17  17  16      RUQ sono 09/12/2023: Gallbladder: No gallstones or wall thickening visualized. No sonographic Murphy sign noted by sonographer.   Common bile duct: Diameter: 5.7 mm mid segment, distally is 8 mm, but no more distended than previously.   Older CT of 05/10/2022 also demonstrated this. Today however, there is also mild intrahepatic biliary prominence. Laboratory and clinical correlation advised.   Liver: No focal lesion identified. Within normal limits in parenchymal echogenicity. Portal vein is patent on color Doppler imaging with normal direction of blood flow towards the liver.   Other: No right upper quadrant ascites.  IMPRESSION: 1. Negative for gallstones. Negative for findings of acute cholecystitis. 2. Mild intrahepatic and extrahepatic biliary prominence. Laboratory and clinical correlation advised.  CTAP with contrast 09/22/2023:  Lower chest: Circumferential wall thickening in the distal esophagus suggesting esophagitis.   Hepatobiliary: No focal hepatic abnormality. Gallbladder unremarkable.   Pancreas: No focal abnormality or ductal dilatation.   Spleen: No focal abnormality.  Normal size.   Adrenals/Urinary Tract: No adrenal abnormality. No focal renal abnormality. No stones or hydronephrosis. Urinary bladder is unremarkable.   Stomach/Bowel: Scattered colonic diverticulosis. No active diverticulitis. Normal appendix. Stomach and small bowel unremarkable.   Vascular/Lymphatic: Scattered aortic calcifications. No evidence of aneurysm or adenopathy.   Reproductive: Prior hysterectomy.  No adnexal masses.   Other: No free fluid or free air.    Musculoskeletal: No acute bony abnormality.   IMPRESSION: Circumferential wall thickening in the visualized distal esophagus suggesting esophagitis.   Colonic diverticulosis.  No active diverticulitis.   Scattered aortic atherosclerosis.  Abdominal MRI/MRCP 09/24/2023: FINDINGS: Lower chest: No acute findings.   Hepatobiliary: No hepatic masses identified.   11 mm enhancing soft tissue nodule is seen in the gallbladder fundus which shows some T2 hypointensity and probable internal cystic foci. This may be due to a gallbladder polyp or focal adenomyosis.   Mild biliary ductal dilatation is seen with common bile duct measuring 8 mm in diameter. A meniscus sign is seen in the distal common bile duct at the ampulla, raising suspicion for a distal common bile duct stone.   Pancreas: No pancreatic mass or inflammatory changes. No evidence of pancreatic ductal dilatation or pancreas divisum.   Spleen:  Within normal limits in size and appearance.   Adrenals/Urinary Tract: No suspicious masses identified. No evidence of hydronephrosis.   Stomach/Bowel: Unremarkable.   Vascular/Lymphatic: No pathologically enlarged lymph nodes identified. No acute vascular findings.   Other:  None.   Musculoskeletal:  No suspicious bone lesions identified.   IMPRESSION: Mild biliary ductal dilatation, with meniscus sign in the distal common bile duct at the ampulla, raising suspicion for a distal common bile duct stone.   11 mm enhancing soft tissue nodule in the gallbladder fundus, which may be due to a gallbladder polyp/neoplasm or focal adenomyosis. Recommend abdomen ultrasound for further evaluation.   No radiographic evidence pancreatitis or pancreas divisum.  PAST GI PROCEDURES:  EGD/EUS 09/24/2023 as an inpatient: EGD impression:  - No gross lesions in the proximal esophagus.  - Severe LA Grade D erosive esophagitis with no bleeding was found from 28 to 39 cm. Biopsied.  -  Z-line irregular, 39 cm from the incisors.  - Erythematous mucosa in the stomach. Biopsied. - Non-bleeding duodenal ulcers.  - Duodenitis. Biopsied.  - Congested major papilla. -Repeat EGD in 3 to 4 months    A. DUODENUM, BIOPSY:  -  Duodenal mucosa with prominent Brunner's glands and focal foveolar  metaplasia consistent with chronic peptic duodenitis.   B. STOMACH, BIOPSY:  -  Antral/oxyntic mucosa with no significant pathology.  -  No Helicobacter pylori organisms identified on HE stained slide.   C. ESOPHAGUS, BIOPSY:  -  Predominantly fragments of squamous mucosa with reactive/reparative  change, focal fragments of submucosa with fibropurulent debris  suggestive of erosion/ulceration and a scant fragment of columnar mucosa  with reactive/reparative change (negative for intestinal metaplasia).   EUS impression:  - Pancreatic parenchymal abnormalities consisting of hyperechoic strands were noted in the pancreatic head, genu of the pancreas, pancreatic body and pancreatic tail.  - The pancreatic  duct had a tortuous/ectatic appearance in the pancreatic head and genu of the pancreas which is suggestive of possible pancreatic divisum. The pancreatic duct itself was normal sized. There did not appear to be any evidence of any stenosis or strictures.  - There was dilation/prominence in the common bile duct and in the common hepatic duct. No evidence of choledocholithiasis.  - No malignant-appearing lymph nodes were visualized in the celiac region (level 20), peripancreatic region and porta hepatis region.  EGD 02/05/2023: - LA Grade C reflux esophagitis with no bleeding.  - Erythematous mucosa in the antrum. Biopsied.  - Erythematous duodenopathy. A. STOMACH, BIOPSY:  - ANTRAL MUCOSA WITH FOCAL REACTIVE/REPARATIVE CHANGES AND RARE,  ASSOCIATED ACUTE INFLAMMATION  - ANTRAL AND OXYNTIC MUCOSA WITH SLIGHT CHRONIC INFLAMMATION  - NEGATIVE FOR INTESTINAL METAPLASIA, DYSPLASIA OR MALIGNANCY    EGD 08/17/2020: LA Grade D esophagitis with no bleeding. Biopsied. The likely souce for bleeding and CT scan findings. - Erosive gastropathy with stigmata of recent bleeding. Biopsied - Erosive gastropathy with stigmata of recent bleeding. Biopsied. - Mild erythematous duodenopathy. - The examination was otherwise normal. A. STOMACH, RANDOM, BIOPSY:  - Mild reactive gastropathy with focal intestinal metaplasia.  - Warthin-Starry is negative for Helicobacter pylori.  - No dysplasia, or malignancy.  B. ESOPHAGUS, DISTAL, BIOPSY:  - Acute esophagitis with ulcer debris.  The biopsies of your esophagus confirmed esophagitis and ulceration.  Pantoprazole twice daily for at least 3 months. Using Carafate up to 4 times daily for the next two weeks.   Small bowel enteroscopy 06/03/2019: - LA Grade D esophagitis. Biopsied. - Normal stomach. Biopsied. - Non-bleeding duodenal ulcers with no stigmata of bleeding. Biopsied. - The examined portion of the jejunum was normal.   Colonoscopy 05/25/2019: - Diverticulosis in the entire examined colon. - The examination was otherwise normal on direct and retroflexion views. - No polyps or cancers.     Current Medications, Allergies, Past Medical History, Past Surgical History, Family History and Social History were reviewed in [employer] record.   Review of Systems:   Constitutional: Negative for fever, sweats, chills or weight loss.  Respiratory: Negative for shortness of breath.   Cardiovascular: Negative for chest pain, palpitations and leg swelling.  Gastrointestinal: See HPI.  Musculoskeletal: Negative for back pain or muscle aches.  Neurological: Negative for dizziness, headaches or paresthesias.    Physical Exam: LMP 08/11/2012  General: in no acute distress. Head: Normocephalic and atraumatic. Eyes: No scleral icterus. Conjunctiva pink . Ears: Normal auditory acuity. Mouth: Dentition intact. No ulcers or  lesions.  Lungs: Clear throughout to auscultation. Heart: Regular rate and rhythm, no murmur. Abdomen: Soft, nontender and nondistended. No masses or hepatomegaly. Normal bowel sounds x 4 quadrants.  Rectal: Deferred.  Musculoskeletal: Symmetrical with no gross deformities. Extremities: No edema. Neurological: Alert oriented x 4. No focal deficits.  Psychological: Alert and cooperative. Normal mood and affect  Assessment and Recommendations: ***

## 2023-10-21 ENCOUNTER — Encounter: Payer: Self-pay | Admitting: Nurse Practitioner

## 2023-10-21 ENCOUNTER — Ambulatory Visit: Payer: BC Managed Care – PPO | Admitting: Nurse Practitioner

## 2023-10-21 ENCOUNTER — Other Ambulatory Visit: Payer: Self-pay | Admitting: Nurse Practitioner

## 2023-10-21 ENCOUNTER — Other Ambulatory Visit (INDEPENDENT_AMBULATORY_CARE_PROVIDER_SITE_OTHER): Payer: BC Managed Care – PPO

## 2023-10-21 VITALS — BP 90/60 | HR 105 | Ht 59.0 in | Wt 125.1 lb

## 2023-10-21 DIAGNOSIS — E109 Type 1 diabetes mellitus without complications: Secondary | ICD-10-CM

## 2023-10-21 DIAGNOSIS — K269 Duodenal ulcer, unspecified as acute or chronic, without hemorrhage or perforation: Secondary | ICD-10-CM

## 2023-10-21 DIAGNOSIS — R112 Nausea with vomiting, unspecified: Secondary | ICD-10-CM | POA: Diagnosis not present

## 2023-10-21 DIAGNOSIS — K209 Esophagitis, unspecified without bleeding: Secondary | ICD-10-CM | POA: Diagnosis not present

## 2023-10-21 LAB — CBC WITH DIFFERENTIAL/PLATELET
Basophils Absolute: 0.1 10*3/uL (ref 0.0–0.1)
Basophils Relative: 1.2 % (ref 0.0–3.0)
Eosinophils Absolute: 0.6 10*3/uL (ref 0.0–0.7)
Eosinophils Relative: 4.6 % (ref 0.0–5.0)
HCT: 41.2 % (ref 36.0–46.0)
Hemoglobin: 13.7 g/dL (ref 12.0–15.0)
Lymphocytes Relative: 29.6 % (ref 12.0–46.0)
Lymphs Abs: 3.6 10*3/uL (ref 0.7–4.0)
MCHC: 33.3 g/dL (ref 30.0–36.0)
MCV: 87.4 fL (ref 78.0–100.0)
Monocytes Absolute: 0.6 10*3/uL (ref 0.1–1.0)
Monocytes Relative: 4.8 % (ref 3.0–12.0)
Neutro Abs: 7.3 10*3/uL (ref 1.4–7.7)
Neutrophils Relative %: 59.8 % (ref 43.0–77.0)
Platelets: 356 10*3/uL (ref 150.0–400.0)
RBC: 4.72 Mil/uL (ref 3.87–5.11)
RDW: 14.9 % (ref 11.5–15.5)
WBC: 12.3 10*3/uL — ABNORMAL HIGH (ref 4.0–10.5)

## 2023-10-21 LAB — BASIC METABOLIC PANEL
BUN: 24 mg/dL — ABNORMAL HIGH (ref 6–23)
CO2: 25 meq/L (ref 19–32)
Calcium: 9.6 mg/dL (ref 8.4–10.5)
Chloride: 94 meq/L — ABNORMAL LOW (ref 96–112)
Creatinine, Ser: 0.81 mg/dL (ref 0.40–1.20)
GFR: 78.96 mL/min (ref >60.00)
Glucose, Bld: 466 mg/dL — ABNORMAL HIGH (ref 70–99)
Potassium: 4.3 meq/L (ref 3.5–5.1)
Sodium: 131 meq/L — ABNORMAL LOW (ref 135–145)

## 2023-10-21 LAB — LIPASE: Lipase: 27 U/L (ref 11.0–59.0)

## 2023-10-21 MED ORDER — METOCLOPRAMIDE HCL 5 MG PO TABS: 5.0000 mg | ORAL_TABLET | Freq: Two times a day (BID) | ORAL | 1 refills | Status: DC

## 2023-10-21 MED ORDER — SUCRALFATE 1 GM/10ML PO SUSP: 1.0000 g | Freq: Two times a day (BID) | ORAL | 0 refills | Status: DC

## 2023-10-21 NOTE — Patient Instructions (Addendum)
Your provider has requested that you go to the basement level for lab work before leaving today. Press "B" on the elevator. The lab is located at the first door on the left as you exit the elevator.  We have sent the following medications to your pharmacy for you to pick up at your convenience: Reglan & Carafate  Due to recent changes in healthcare laws, you may see the results of your imaging and laboratory studies on MyChart before your provider has had a chance to review them.  We understand that in some cases there may be results that are confusing or concerning to you. Not all laboratory results come back in the same time frame and the provider may be waiting for multiple results in order to interpret others.  Please give [place] 48 hours in order for your provider to thoroughly review all the results before contacting the office for clarification of your results.   Thank you for trusting me with your gastrointestinal care!   Alcide Evener, CRNP

## 2023-10-21 NOTE — Progress Notes (Unsigned)
{  Select_TRH_Note:[id number]} 

## 2023-10-23 NOTE — Progress Notes (Signed)
[name] [name], refer to [name] addendum below. Pls send in a new prescription for [name] 1gm po bid with 3 refills ot to take any other medication within 2 to 4 hours. Patient to continue Carafate until her next EGD. Continue Pantoprazole bid.  Schedule patient for an EGD with [name] around Dec 25, 2023 ( 3 months from her Oct EGD/EUS date).  Pls send patient to out lab within the next week for a fasting am cortisol level.  THX

## 2023-10-23 NOTE — Progress Notes (Signed)
Attending Physician's Attestation   I have reviewed the chart.   I agree with the Advanced Practitioner's note, impression, and recommendations with any updates as below. As patient's prior gastroenterologist has retired and and most recent gastroenterologist will be retiring at the end of this year I am comfortable with moving forward as her GI provider.  However her history is quite complex and she has had much more cross-sectional imaging than most individuals should have in their lifetime.  I think she needs to just go ahead and continue Carafate therapy twice daily until her next endoscopy.  I would schedule it in approximately 55-month mark from her previous endoscopy to ensure healing of her esophagitis and her ulcers.  With upper EUS having been done before the MRI/MRCP, we knew that there was no evidence of any bile duct stones at that time and no evidence of pancreatic divisum.  She has not shown a manifested repeat pancreatitis labs so no indication for an ERCP.  I agree that getting her blood sugar control is key to trying to help her.  If she has not had adrenal insufficiency evaluation with an early a.m. cortisol having been drawn, I would recommend that as well.   [name], MD [facility] Advanced Endoscopy Office # [phone]

## 2023-10-25 ENCOUNTER — Inpatient Hospital Stay (HOSPITAL_COMMUNITY)
Admission: EM | Admit: 2023-10-25 | Discharge: 2023-10-28 | DRG: 074 | Disposition: A | Discharge status: Home / Self Care | Payer: BC Managed Care – PPO | Attending: Student | Admitting: Student

## 2023-10-25 ENCOUNTER — Other Ambulatory Visit: Payer: Self-pay

## 2023-10-25 ENCOUNTER — Encounter (HOSPITAL_COMMUNITY): Payer: Self-pay | Admitting: Emergency Medicine

## 2023-10-25 ENCOUNTER — Emergency Department (HOSPITAL_COMMUNITY): Payer: BC Managed Care – PPO

## 2023-10-25 DIAGNOSIS — K219 Gastro-esophageal reflux disease without esophagitis: Secondary | ICD-10-CM | POA: Diagnosis present

## 2023-10-25 DIAGNOSIS — Z794 Long term (current) use of insulin: Secondary | ICD-10-CM

## 2023-10-25 DIAGNOSIS — Z833 Family history of diabetes mellitus: Secondary | ICD-10-CM

## 2023-10-25 DIAGNOSIS — K2980 Duodenitis without bleeding: Secondary | ICD-10-CM | POA: Diagnosis not present

## 2023-10-25 DIAGNOSIS — R739 Hyperglycemia, unspecified: Principal | ICD-10-CM | POA: Diagnosis present

## 2023-10-25 DIAGNOSIS — I9589 Other hypotension: Secondary | ICD-10-CM | POA: Diagnosis present

## 2023-10-25 DIAGNOSIS — K2210 Ulcer of esophagus without bleeding: Secondary | ICD-10-CM | POA: Diagnosis not present

## 2023-10-25 DIAGNOSIS — R109 Unspecified abdominal pain: Secondary | ICD-10-CM | POA: Diagnosis present

## 2023-10-25 DIAGNOSIS — Z87891 Personal history of nicotine dependence: Secondary | ICD-10-CM | POA: Diagnosis not present

## 2023-10-25 DIAGNOSIS — Z79899 Other long term (current) drug therapy: Secondary | ICD-10-CM

## 2023-10-25 DIAGNOSIS — F419 Anxiety disorder, unspecified: Secondary | ICD-10-CM | POA: Diagnosis not present

## 2023-10-25 DIAGNOSIS — E1043 Type 1 diabetes mellitus with diabetic autonomic (poly)neuropathy: Secondary | ICD-10-CM | POA: Diagnosis not present

## 2023-10-25 DIAGNOSIS — K429 Umbilical hernia without obstruction or gangrene: Secondary | ICD-10-CM | POA: Diagnosis not present

## 2023-10-25 DIAGNOSIS — F32A Depression, unspecified: Secondary | ICD-10-CM | POA: Diagnosis present

## 2023-10-25 DIAGNOSIS — Z8249 Family history of ischemic heart disease and other diseases of the circulatory system: Secondary | ICD-10-CM | POA: Diagnosis not present

## 2023-10-25 DIAGNOSIS — E109 Type 1 diabetes mellitus without complications: Secondary | ICD-10-CM | POA: Diagnosis present

## 2023-10-25 DIAGNOSIS — R112 Nausea with vomiting, unspecified: Secondary | ICD-10-CM | POA: Diagnosis present

## 2023-10-25 DIAGNOSIS — E039 Hypothyroidism, unspecified: Secondary | ICD-10-CM | POA: Diagnosis not present

## 2023-10-25 DIAGNOSIS — K279 Peptic ulcer, site unspecified, unspecified as acute or chronic, without hemorrhage or perforation: Secondary | ICD-10-CM | POA: Diagnosis present

## 2023-10-25 DIAGNOSIS — K259 Gastric ulcer, unspecified as acute or chronic, without hemorrhage or perforation: Secondary | ICD-10-CM | POA: Diagnosis present

## 2023-10-25 DIAGNOSIS — Z79890 Hormone replacement therapy (postmenopausal): Secondary | ICD-10-CM | POA: Diagnosis not present

## 2023-10-25 DIAGNOSIS — N182 Chronic kidney disease, stage 2 (mild): Secondary | ICD-10-CM | POA: Diagnosis present

## 2023-10-25 DIAGNOSIS — I130 Hypertensive heart and chronic kidney disease with heart failure and stage 1 through stage 4 chronic kidney disease, or unspecified chronic kidney disease: Secondary | ICD-10-CM | POA: Diagnosis not present

## 2023-10-25 DIAGNOSIS — K861 Other chronic pancreatitis: Secondary | ICD-10-CM | POA: Diagnosis present

## 2023-10-25 DIAGNOSIS — R1084 Generalized abdominal pain: Secondary | ICD-10-CM | POA: Diagnosis not present

## 2023-10-25 DIAGNOSIS — R1032 Left lower quadrant pain: Secondary | ICD-10-CM | POA: Diagnosis not present

## 2023-10-25 DIAGNOSIS — I480 Paroxysmal atrial fibrillation: Secondary | ICD-10-CM | POA: Diagnosis not present

## 2023-10-25 DIAGNOSIS — K824 Cholesterolosis of gallbladder: Secondary | ICD-10-CM | POA: Diagnosis not present

## 2023-10-25 DIAGNOSIS — E1065 Type 1 diabetes mellitus with hyperglycemia: Secondary | ICD-10-CM | POA: Diagnosis present

## 2023-10-25 DIAGNOSIS — Z7982 Long term (current) use of aspirin: Secondary | ICD-10-CM

## 2023-10-25 DIAGNOSIS — K269 Duodenal ulcer, unspecified as acute or chronic, without hemorrhage or perforation: Secondary | ICD-10-CM | POA: Diagnosis not present

## 2023-10-25 DIAGNOSIS — F1210 Cannabis abuse, uncomplicated: Secondary | ICD-10-CM | POA: Diagnosis not present

## 2023-10-25 DIAGNOSIS — R339 Retention of urine, unspecified: Secondary | ICD-10-CM | POA: Diagnosis present

## 2023-10-25 DIAGNOSIS — F331 Major depressive disorder, recurrent, moderate: Secondary | ICD-10-CM | POA: Diagnosis not present

## 2023-10-25 DIAGNOSIS — Z820 Family history of epilepsy and other diseases of the nervous system: Secondary | ICD-10-CM

## 2023-10-25 DIAGNOSIS — K3184 Gastroparesis: Secondary | ICD-10-CM | POA: Diagnosis not present

## 2023-10-25 DIAGNOSIS — E871 Hypo-osmolality and hyponatremia: Secondary | ICD-10-CM | POA: Diagnosis not present

## 2023-10-25 DIAGNOSIS — Z91199 Patient's noncompliance with other medical treatment and regimen due to unspecified reason: Secondary | ICD-10-CM

## 2023-10-25 DIAGNOSIS — M810 Age-related osteoporosis without current pathological fracture: Secondary | ICD-10-CM | POA: Diagnosis present

## 2023-10-25 DIAGNOSIS — K2970 Gastritis, unspecified, without bleeding: Secondary | ICD-10-CM | POA: Diagnosis present

## 2023-10-25 DIAGNOSIS — E785 Hyperlipidemia, unspecified: Secondary | ICD-10-CM | POA: Diagnosis not present

## 2023-10-25 DIAGNOSIS — F1290 Cannabis use, unspecified, uncomplicated: Secondary | ICD-10-CM | POA: Diagnosis present

## 2023-10-25 DIAGNOSIS — I5032 Chronic diastolic (congestive) heart failure: Secondary | ICD-10-CM | POA: Diagnosis not present

## 2023-10-25 DIAGNOSIS — I10 Essential (primary) hypertension: Secondary | ICD-10-CM | POA: Diagnosis not present

## 2023-10-25 DIAGNOSIS — E538 Deficiency of other specified B group vitamins: Secondary | ICD-10-CM | POA: Diagnosis present

## 2023-10-25 DIAGNOSIS — K5730 Diverticulosis of large intestine without perforation or abscess without bleeding: Secondary | ICD-10-CM | POA: Diagnosis not present

## 2023-10-25 DIAGNOSIS — R9431 Abnormal electrocardiogram [ECG] [EKG]: Secondary | ICD-10-CM | POA: Diagnosis not present

## 2023-10-25 DIAGNOSIS — E1165 Type 2 diabetes mellitus with hyperglycemia: Secondary | ICD-10-CM | POA: Diagnosis not present

## 2023-10-25 LAB — COMPREHENSIVE METABOLIC PANEL
ALT: 13 U/L (ref 0–44)
AST: 20 U/L (ref 15–41)
Albumin: 4.1 g/dL (ref 3.5–5.0)
Alkaline Phosphatase: 75 U/L (ref 38–126)
Anion gap: 14 (ref 5–15)
BUN: 28 mg/dL — ABNORMAL HIGH (ref 6–20)
CO2: 27 mmol/L (ref 22–32)
Calcium: 9.4 mg/dL (ref 8.9–10.3)
Chloride: 88 mmol/L — ABNORMAL LOW (ref 98–111)
Creatinine, Ser: 0.95 mg/dL (ref 0.44–1.00)
GFR, Estimated: >60 mL/min (ref >60)
Glucose, Bld: 483 mg/dL — ABNORMAL HIGH (ref 70–99)
Potassium: 4 mmol/L (ref 3.5–5.1)
Sodium: 129 mmol/L — ABNORMAL LOW (ref 135–145)
Total Bilirubin: 0.7 mg/dL (ref <1.2)
Total Protein: 8.3 g/dL — ABNORMAL HIGH (ref 6.5–8.1)

## 2023-10-25 LAB — BLOOD GAS, VENOUS
Acid-Base Excess: 8.5 mmol/L — ABNORMAL HIGH (ref 0.0–2.0)
Bicarbonate: 34.5 mmol/L — ABNORMAL HIGH (ref 20.0–28.0)
O2 Saturation: 83.3 %
Patient temperature: 37
pCO2, Ven: 52 mm[Hg] (ref 44–60)
pH, Ven: 7.43 (ref 7.25–7.43)
pO2, Ven: 51 mm[Hg] — ABNORMAL HIGH (ref 32–45)

## 2023-10-25 LAB — LIPASE, BLOOD: Lipase: 36 U/L (ref 11–51)

## 2023-10-25 LAB — CBC WITH DIFFERENTIAL/PLATELET
Abs Immature Granulocytes: 0.05 10*3/uL (ref 0.00–0.07)
Basophils Absolute: 0 10*3/uL (ref 0.0–0.1)
Basophils Relative: 0 %
Eosinophils Absolute: 0.5 10*3/uL (ref 0.0–0.5)
Eosinophils Relative: 5 %
HCT: 43.2 % (ref 36.0–46.0)
Hemoglobin: 14.4 g/dL (ref 12.0–15.0)
Immature Granulocytes: 1 %
Lymphocytes Relative: 21 %
Lymphs Abs: 1.9 10*3/uL (ref 0.7–4.0)
MCH: 28.6 pg (ref 26.0–34.0)
MCHC: 33.3 g/dL (ref 30.0–36.0)
MCV: 85.9 fL (ref 80.0–100.0)
Monocytes Absolute: 0.4 10*3/uL (ref 0.1–1.0)
Monocytes Relative: 4 %
Neutro Abs: 6.3 10*3/uL (ref 1.7–7.7)
Neutrophils Relative %: 69 %
Platelets: 355 10*3/uL (ref 150–400)
RBC: 5.03 MIL/uL (ref 3.87–5.11)
RDW: 14.5 % (ref 11.5–15.5)
WBC: 9.1 10*3/uL (ref 4.0–10.5)
nRBC: 0 % (ref 0.0–0.2)

## 2023-10-25 LAB — URINALYSIS, ROUTINE W REFLEX MICROSCOPIC
Bilirubin Urine: NEGATIVE
Glucose, UA: >=500 mg/dL — AB
Hgb urine dipstick: NEGATIVE
Ketones, ur: 5 mg/dL — AB
Leukocytes,Ua: NEGATIVE
Nitrite: NEGATIVE
Protein, ur: NEGATIVE mg/dL
Specific Gravity, Urine: 1.024 (ref 1.005–1.030)
pH: 7 (ref 5.0–8.0)

## 2023-10-25 LAB — CBG MONITORING, ED
Glucose-Capillary: 470 mg/dL — ABNORMAL HIGH (ref 70–99)
Glucose-Capillary: 392 mg/dL — ABNORMAL HIGH (ref 70–99)

## 2023-10-25 LAB — GLUCOSE, CAPILLARY
Glucose-Capillary: 228 mg/dL — ABNORMAL HIGH (ref 70–99)
Glucose-Capillary: 228 mg/dL — ABNORMAL HIGH (ref 70–99)
Glucose-Capillary: 279 mg/dL — ABNORMAL HIGH (ref 70–99)

## 2023-10-25 LAB — BETA-HYDROXYBUTYRIC ACID: Beta-Hydroxybutyric Acid: 1.32 mmol/L — ABNORMAL HIGH (ref 0.05–0.27)

## 2023-10-25 LAB — TROPONIN I (HIGH SENSITIVITY): Troponin I (High Sensitivity): 22 ng/L — ABNORMAL HIGH

## 2023-10-25 MED ORDER — HYDROMORPHONE HCL 1 MG/ML IJ SOLN
1.0000 mg | Freq: Once | INTRAMUSCULAR | Status: AC
  Administered 2023-10-25: 1 mg via INTRAVENOUS
  Filled 2023-10-25: qty 1

## 2023-10-25 MED ORDER — INSULIN ASPART 100 UNIT/ML IJ SOLN
8.0000 [IU] | Freq: Once | INTRAMUSCULAR | Status: AC
  Administered 2023-10-25: 8 [IU] via SUBCUTANEOUS
  Filled 2023-10-25: qty 0.08

## 2023-10-25 MED ORDER — ALUM & MAG HYDROXIDE-SIMETH 200-200-20 MG/5ML PO SUSP
30.0000 mL | Freq: Once | ORAL | Status: AC
  Administered 2023-10-25: 30 mL via ORAL
  Filled 2023-10-25: qty 30

## 2023-10-25 MED ORDER — INSULIN ASPART 100 UNIT/ML IJ SOLN
0.0000 [IU] | Freq: Every day | INTRAMUSCULAR | Status: DC
  Administered 2023-10-25: 3 [IU] via SUBCUTANEOUS
  Administered 2023-10-26: 2 [IU] via SUBCUTANEOUS
  Filled 2023-10-25: qty 0.05

## 2023-10-25 MED ORDER — POLYETHYLENE GLYCOL 3350 17 G PO PACK: 17.0000 g | PACK | Freq: Every day | ORAL | Status: DC | PRN

## 2023-10-25 MED ORDER — HYDROCODONE-ACETAMINOPHEN 5-325 MG PO TABS: 1.0000 | ORAL_TABLET | ORAL | Status: DC | PRN

## 2023-10-25 MED ORDER — ASPIRIN 81 MG PO TBEC
81.0000 mg | DELAYED_RELEASE_TABLET | Freq: Every day | ORAL | Status: DC
  Administered 2023-10-26 – 2023-10-27 (×2): 81 mg via ORAL
  Filled 2023-10-25 (×2): qty 1

## 2023-10-25 MED ORDER — LABETALOL HCL 5 MG/ML IV SOLN
20.0000 mg | INTRAVENOUS | Status: DC | PRN
  Administered 2023-10-25: 20 mg via INTRAVENOUS
  Filled 2023-10-25: qty 4

## 2023-10-25 MED ORDER — ACETAMINOPHEN 650 MG RE SUPP: 650.0000 mg | Freq: Four times a day (QID) | RECTAL | Status: DC | PRN

## 2023-10-25 MED ORDER — IOHEXOL 300 MG/ML  SOLN
100.0000 mL | Freq: Once | INTRAMUSCULAR | Status: AC | PRN
  Administered 2023-10-25: 100 mL via INTRAVENOUS

## 2023-10-25 MED ORDER — ESCITALOPRAM OXALATE 10 MG PO TABS
10.0000 mg | ORAL_TABLET | Freq: Every day | ORAL | Status: DC
  Administered 2023-10-26 – 2023-10-27 (×2): 10 mg via ORAL
  Filled 2023-10-25 (×3): qty 1

## 2023-10-25 MED ORDER — RANOLAZINE ER 500 MG PO TB12
500.0000 mg | ORAL_TABLET | Freq: Two times a day (BID) | ORAL | Status: DC
  Administered 2023-10-26 – 2023-10-27 (×4): 500 mg via ORAL
  Filled 2023-10-25 (×6): qty 1

## 2023-10-25 MED ORDER — LIDOCAINE VISCOUS HCL 2 % MT SOLN
15.0000 mL | Freq: Once | OROMUCOSAL | Status: AC
  Administered 2023-10-25: 15 mL via OROMUCOSAL
  Filled 2023-10-25: qty 15

## 2023-10-25 MED ORDER — INSULIN GLARGINE (1 UNIT DIAL) 300 UNIT/ML ~~LOC~~ SOPN: 8.0000 [IU] | PEN_INJECTOR | Freq: Every day | SUBCUTANEOUS | Status: DC

## 2023-10-25 MED ORDER — SUCRALFATE 1 G PO TABS
1.0000 g | ORAL_TABLET | Freq: Three times a day (TID) | ORAL | Status: DC
  Administered 2023-10-26 – 2023-10-28 (×7): 1 g via ORAL
  Filled 2023-10-25 (×9): qty 1

## 2023-10-25 MED ORDER — ONDANSETRON HCL 4 MG/2ML IJ SOLN
4.0000 mg | INTRAMUSCULAR | Status: DC | PRN
  Administered 2023-10-25 – 2023-10-26 (×2): 4 mg via INTRAVENOUS
  Filled 2023-10-25 (×2): qty 2

## 2023-10-25 MED ORDER — HYDRALAZINE HCL 20 MG/ML IJ SOLN: 10.0000 mg | INTRAMUSCULAR | Status: DC | PRN

## 2023-10-25 MED ORDER — PROMETHAZINE (PHENERGAN) 6.25MG IN NS 50ML IVPB
6.2500 mg | Freq: Four times a day (QID) | INTRAVENOUS | Status: DC | PRN
  Administered 2023-10-25: 6.25 mg via INTRAVENOUS
  Filled 2023-10-25: qty 6.25
  Filled 2023-10-25: qty 50

## 2023-10-25 MED ORDER — LEVOTHYROXINE SODIUM 137 MCG PO TABS
137.0000 ug | ORAL_TABLET | Freq: Every day | ORAL | Status: DC
  Administered 2023-10-26 – 2023-10-28 (×3): 137 ug via ORAL
  Filled 2023-10-25 (×3): qty 1

## 2023-10-25 MED ORDER — SODIUM CHLORIDE 0.9 % IV BOLUS
1000.0000 mL | Freq: Once | INTRAVENOUS | Status: AC
  Administered 2023-10-25: 1000 mL via INTRAVENOUS

## 2023-10-25 MED ORDER — INSULIN GLARGINE-YFGN 100 UNIT/ML ~~LOC~~ SOLN
8.0000 [IU] | Freq: Every day | SUBCUTANEOUS | Status: DC
  Administered 2023-10-25: 8 [IU] via SUBCUTANEOUS
  Filled 2023-10-25 (×2): qty 0.08

## 2023-10-25 MED ORDER — INSULIN ASPART 100 UNIT/ML IJ SOLN
0.0000 [IU] | Freq: Three times a day (TID) | INTRAMUSCULAR | Status: DC
  Administered 2023-10-26: 11 [IU] via SUBCUTANEOUS
  Administered 2023-10-26 – 2023-10-27 (×3): 3 [IU] via SUBCUTANEOUS
  Administered 2023-10-28: 8 [IU] via SUBCUTANEOUS
  Filled 2023-10-25: qty 0.15

## 2023-10-25 MED ORDER — ACETAMINOPHEN 325 MG PO TABS: 650.0000 mg | ORAL_TABLET | Freq: Four times a day (QID) | ORAL | Status: DC | PRN

## 2023-10-25 MED ORDER — ONDANSETRON HCL 4 MG/2ML IJ SOLN
4.0000 mg | Freq: Once | INTRAMUSCULAR | Status: AC
  Administered 2023-10-25: 4 mg via INTRAVENOUS
  Filled 2023-10-25: qty 2

## 2023-10-25 MED ORDER — SODIUM CHLORIDE 0.9% FLUSH
3.0000 mL | Freq: Two times a day (BID) | INTRAVENOUS | Status: DC
  Administered 2023-10-25 – 2023-10-27 (×5): 3 mL via INTRAVENOUS

## 2023-10-25 MED ORDER — ROSUVASTATIN CALCIUM 10 MG PO TABS
10.0000 mg | ORAL_TABLET | Freq: Every day | ORAL | Status: DC
  Administered 2023-10-26 – 2023-10-27 (×2): 10 mg via ORAL
  Filled 2023-10-25: qty 0.5
  Filled 2023-10-25: qty 1
  Filled 2023-10-25: qty 0.5
  Filled 2023-10-25: qty 2
  Filled 2023-10-25 (×2): qty 0.5
  Filled 2023-10-25: qty 1

## 2023-10-25 MED ORDER — TAMSULOSIN HCL 0.4 MG PO CAPS
0.4000 mg | ORAL_CAPSULE | Freq: Every day | ORAL | Status: DC
  Administered 2023-10-26 – 2023-10-27 (×2): 0.4 mg via ORAL
  Filled 2023-10-25 (×3): qty 1

## 2023-10-25 MED ORDER — PANTOPRAZOLE SODIUM 40 MG PO TBEC
40.0000 mg | DELAYED_RELEASE_TABLET | Freq: Two times a day (BID) | ORAL | Status: DC
  Administered 2023-10-26 – 2023-10-27 (×4): 40 mg via ORAL
  Filled 2023-10-25 (×5): qty 1

## 2023-10-25 MED ORDER — METOCLOPRAMIDE HCL 10 MG PO TABS
5.0000 mg | ORAL_TABLET | Freq: Two times a day (BID) | ORAL | Status: DC
  Filled 2023-10-25: qty 1

## 2023-10-25 MED ORDER — QUETIAPINE FUMARATE 50 MG PO TABS
50.0000 mg | ORAL_TABLET | Freq: Every day | ORAL | Status: DC
  Administered 2023-10-27: 50 mg via ORAL
  Filled 2023-10-25: qty 1
  Filled 2023-10-25: qty 2
  Filled 2023-10-25 (×2): qty 1
  Filled 2023-10-25 (×2): qty 2

## 2023-10-25 MED ORDER — MORPHINE SULFATE (PF) 2 MG/ML IV SOLN
2.0000 mg | INTRAVENOUS | Status: DC | PRN
  Administered 2023-10-25 – 2023-10-26 (×2): 2 mg via INTRAVENOUS
  Filled 2023-10-25 (×2): qty 1

## 2023-10-25 MED ORDER — ENOXAPARIN SODIUM 40 MG/0.4ML IJ SOSY
40.0000 mg | PREFILLED_SYRINGE | INTRAMUSCULAR | Status: DC
  Administered 2023-10-26 – 2023-10-28 (×3): 40 mg via SUBCUTANEOUS
  Filled 2023-10-25 (×3): qty 0.4

## 2023-10-25 MED ORDER — PROMETHAZINE (PHENERGAN) 6.25MG IN NS 50ML IVPB
6.2500 mg | Freq: Four times a day (QID) | INTRAVENOUS | Status: DC | PRN
  Administered 2023-10-25: 6.25 mg via INTRAVENOUS
  Filled 2023-10-25: qty 6.25

## 2023-10-25 NOTE — Inpatient Diabetes Management (Addendum)
Inpatient Diabetes Program Recommendations  AACE/ADA: New Consensus Statement on Inpatient Glycemic Control (2015)  Target Ranges:  Prepandial:   less than 140 mg/dL      Peak postprandial:   less than 180 mg/dL (1-2 hours)      Critically ill patients:  140 - 180 mg/dL   Lab Results  Component Value Date   GLUCAP 392 (H) 10/25/2023   HGBA1C >15.5 (H) 08/22/2023    Review of Glycemic Control  Diabetes history: DM type 1 Outpatient Diabetes medications: Toujeo 8 units qhs, Novolog 4 units tid + SSI, Dexcom G7 Current orders for Inpatient glycemic control:   Sees Endocrinologist Dr. Criselda Peaches every 3 months initial appointment on 04/11/2023, next visit next month. A1c >15.5% on 9/12  Inpatient Diabetes Program Recommendations:    -   Pt may need another BMET and CBG check was having some ketones earlier this am in U/A -   Restart Semglee 8 units Basal insulin -   Start Novolog 0-15 units Q4 hours while NPO  Spoke with pt at bedside and discussed her A1c level. Pt reports working with her Endocrinologist to lower her levels. Pt having nausea currently and asking for medication to assist with the symptoms. Encouraged pt to have more glucose control to reduce potential complications that result from persistent hyperglycemia. Pt has been in the hospital with admissions and ED visits for hyperglycemia and abd pain frequently.  Thanks,  [name] Deem [name], [name] [name], [name] [name] Inpatient Diabetes Coordinator Team Pager [phone] (8a-5p)

## 2023-10-25 NOTE — ED Notes (Signed)
Pt complains of nausea and begins to vomit after administration of Viscous Lidocaine

## 2023-10-25 NOTE — ED Provider Notes (Signed)
[facility] AT [facility] Provider Note   CSN: [id number] Arrival date & time: 10/25/23  7846     History  Chief Complaint  Patient presents with   Hyperglycemia   Abdominal Pain    [name] [name] is a 62 y.o. female with a past medical history significant for poorly controlled type 1 diabetes, history of DKA, hx of pancreatitis, hypertension, hypothyroidism, and CKD who presents to the ED due to generalized abdominal pain associated with nausea that has been present for the past few days.  Patient has been seen numerous times for abdominal pain.  Last seen on 11/7.  Patient saw GI on 11/11 and was advised to take Carafate twice daily due to known ulcer.  No vomiting or diarrhea.  Denies fever and chills.  Denies chest pain and shortness of breath.  She notes she has been compliant with her Carafate.  Notes this abdominal pain feels similar to her previous episodes of abdominal pain.  Denies urinary or vaginal symptoms.  CBG with EMS 555.  Patient has not taken any insulin this morning.  Patient had an EGD on 10/15 by [name] with GI which demonstrated severe esophagitis, duodenitis and nonbleeding duodenal ulcers. No evidence of H Pylori.   History obtained from patient and past medical records. No interpreter used during encounter.       Home Medications Prior to Admission medications   Medication Sig Start Date End Date Taking? Authorizing Provider  aspirin EC 81 MG tablet Take 81 mg by mouth daily. Swallow whole.    [provider]  Continuous Blood Gluc Sensor (DEXCOM G6 SENSOR) MISC INJECT 1 SENSOR INTO SKIN EVERY 10 DAYS Patient taking differently: Inject 1 Device into the skin See admin instructions. Place 1 new device into the skin every 10 days 07/18/22   Excell Seltzer, MD  Continuous Blood Gluc Transmit (DEXCOM G6 TRANSMITTER) MISC USE AS DIRECTED FOR CONTINUOUS GLUCOSE MONITORING. REUSE TRANSMITTER X 90DAYS THEN DISCARD &  REPLACE 07/05/21   Bedsole, Amy E, MD  dicyclomine (BENTYL) 20 MG tablet Take 1 tablet (20 mg total) by mouth 2 (two) times daily. 10/17/23   Karie Mainland, Amjad, PA-C  escitalopram (LEXAPRO) 10 MG tablet Take 10 mg by mouth daily. 09/09/23   [provider]  Glucagon, rDNA, (GLUCAGON EMERGENCY) 1 MG KIT Inject 1 mg into the skin See admin instructions. Inject one mg into the skin see administration instructions. Follow package directions for low blood sugar. 06/18/23   [provider]  HYDROcodone-acetaminophen (NORCO/VICODIN) 5-325 MG tablet Take 1 tablet by mouth every 4 (four) hours as needed for moderate pain. 09/21/23   Gilda Crease, MD  hyoscyamine (LEVSIN/SL) 0.125 MG SL tablet Place 1 tablet (0.125 mg total) under the tongue every 4 (four) hours as needed. 09/21/23   Gilda Crease, MD  levothyroxine (SYNTHROID) 137 MCG tablet Take 137 mcg by mouth daily before breakfast. 03/19/22   [provider]  metoCLOPramide (REGLAN) 5 MG tablet Take 1 tablet (5 mg total) by mouth 3 (three) times daily before meals. Patient taking differently: Take 5 mg by mouth as needed for nausea or vomiting. 02/06/23   Albertine Grates, MD  metoCLOPramide (REGLAN) 5 MG tablet Take 1 tablet (5 mg total) by mouth 2 (two) times daily. Take 30 minutes before breakfast and dinner. 10/21/23   Arnaldo Natal, NP  midodrine (PROAMATINE) 5 MG tablet Take 1 tablet (5 mg total) by mouth 2 (two) times daily with  a meal. Hold if your systolic blood pressure  ( top number) is > , please check your blood pressure at home once a day and bring in record for your doctor to review, follow up with your pcp and cardiology regarding your blood pressure medication Patient taking differently: Take 5 mg by mouth daily. 02/06/23   Albertine Grates, MD  NOVOLOG FLEXPEN 100 UNIT/ML FlexPen Inject 4 Units into the skin 3 (three) times daily with meals.    [provider]  omega-3 acid ethyl esters (LOVAZA) 1  g capsule Take 2 capsules (2 g total) by mouth 2 (two) times daily. 09/13/23   Alwyn Ren, MD  ondansetron (ZOFRAN) 4 MG tablet Take 1 tablet (4 mg total) by mouth every 6 (six) hours as needed for nausea. 09/13/23   Alwyn Ren, MD  ondansetron (ZOFRAN-ODT) 4 MG disintegrating tablet Take 1 tablet (4 mg total) by mouth every 8 (eight) hours as needed. 10/17/23   Karie Mainland, Amjad, PA-C  pantoprazole (PROTONIX) 40 MG tablet Take 1 tablet (40 mg total) by mouth 2 (two) times daily. 09/25/23 10/25/23  Tyrone Nine, MD  promethazine (PHENERGAN) 25 MG suppository Place 1 suppository (25 mg total) rectally every 6 (six) hours as needed for nausea or vomiting. 10/05/23   Renne Crigler, PA-C  promethazine (PHENERGAN) 25 MG tablet Take 1 tablet (25 mg total) by mouth every 6 (six) hours as needed for nausea or vomiting. 10/05/23   Renne Crigler, PA-C  QUEtiapine (SEROQUEL) 50 MG tablet Take 50 mg by mouth daily. 09/09/23   [provider]  ranolazine (RANEXA) 500 MG 12 hr tablet Take 500 mg by mouth 2 (two) times daily. 07/23/23   [provider]  rosuvastatin (CRESTOR) 10 MG tablet Take 10 mg by mouth daily. 06/17/23   [provider]  sucralfate (CARAFATE) 1 g tablet Take 1 tablet (1 g total) by mouth 4 (four) times daily -  with meals and at bedtime for 14 days. 09/25/23 10/09/23  Tyrone Nine, MD  sucralfate (CARAFATE) 1 GM/10ML suspension Take 10 mLs (1 g total) by mouth 2 (two) times daily for 10 days. Do not take within 2 hours of any other medications. 10/21/23 10/31/23  Arnaldo Natal, NP  tamsulosin (FLOMAX) 0.4 MG CAPS capsule Take 1 capsule (0.4 mg total) by mouth daily after supper. 09/04/23   Arnetha Courser, MD  TOUJEO SOLOSTAR 300 UNIT/ML Solostar Pen Inject 12 Units into the skin at bedtime. Patient taking differently: Inject 8 Units into the skin at bedtime. 08/24/23   Lewie Chamber, MD  traZODone (DESYREL) 50 MG tablet Take 50 mg by mouth at  bedtime.    [provider]      Allergies    Codeine    Review of Systems   Review of Systems  Constitutional:  Negative for fever.  Respiratory:  Negative for shortness of breath.   Cardiovascular:  Negative for chest pain.  Gastrointestinal:  Positive for abdominal pain and nausea. Negative for diarrhea and vomiting.    Physical Exam Updated Vital Signs BP (!) 144/104   Pulse 92   Temp 97.8 F (36.6 C) (Oral)   Resp 15   Ht 4\' 11"  (1.499 m)   Wt 56.7 kg   LMP 08/11/2012   SpO2 99%   BMI 25.25 kg/m  Physical Exam Vitals and nursing note reviewed.  Constitutional:      General: She is not in acute distress.    Appearance: She is not  ill-appearing.     Comments: Appears uncomfortable in bed  HENT:     Head: Normocephalic.  Eyes:     Pupils: Pupils are equal, round, and reactive to light.  Cardiovascular:     Rate and Rhythm: Normal rate and regular rhythm.     Pulses: Normal pulses.     Heart sounds: Normal heart sounds. No murmur heard.    No friction rub. No gallop.  Pulmonary:     Effort: Pulmonary effort is normal.     Breath sounds: Normal breath sounds.  Abdominal:     General: Abdomen is flat. There is no distension.     Palpations: Abdomen is soft.     Tenderness: There is abdominal tenderness. There is no guarding or rebound.     Comments: Diffuse tenderness without rebound or guarding.  Musculoskeletal:        General: Normal range of motion.     Cervical back: Neck supple.  Skin:    General: Skin is warm and dry.  Neurological:     General: No focal deficit present.     Mental Status: She is alert.  Psychiatric:        Mood and Affect: Mood normal.        Behavior: Behavior normal.     ED Results / Procedures / Treatments   Labs (all labs ordered are listed, but only abnormal results are displayed) Labs Reviewed  URINALYSIS, ROUTINE W REFLEX MICROSCOPIC - Abnormal; Notable for the following components:      Result Value    Glucose, UA >=500 (*)    Ketones, ur 5 (*)    Bacteria, UA RARE (*)    All other components within normal limits  COMPREHENSIVE METABOLIC PANEL - Abnormal; Notable for the following components:   Sodium 129 (*)    Chloride 88 (*)    Glucose, Bld 483 (*)    BUN 28 (*)    Total Protein 8.3 (*)    All other components within normal limits  BETA-HYDROXYBUTYRIC ACID - Abnormal; Notable for the following components:   Beta-Hydroxybutyric Acid 1.32 (*)    All other components within normal limits  BLOOD GAS, VENOUS - Abnormal; Notable for the following components:   pO2, Ven 51 (*)    Bicarbonate 34.5 (*)    Acid-Base Excess 8.5 (*)    All other components within normal limits  CBG MONITORING, ED - Abnormal; Notable for the following components:   Glucose-Capillary 470 (*)    All other components within normal limits  CBG MONITORING, ED - Abnormal; Notable for the following components:   Glucose-Capillary 392 (*)    All other components within normal limits  CBC WITH DIFFERENTIAL/PLATELET  LIPASE, BLOOD  CBG MONITORING, ED  CBG MONITORING, ED    EKG None  Radiology No results found.  Procedures Procedures    Medications Ordered in ED Medications  promethazine (PHENERGAN) 6.25 mg/NS 50 mL IVPB (0 mg Intravenous Stopped 10/25/23 1107)  ondansetron (ZOFRAN) injection 4 mg (has no administration in time range)  sodium chloride 0.9 % bolus 1,000 mL (0 mLs Intravenous Stopped 10/25/23 0930)  alum & mag hydroxide-simeth (MAALOX/MYLANTA) 200-200-20 MG/5ML suspension 30 mL (30 mLs Oral Given 10/25/23 0654)  HYDROmorphone (DILAUDID) injection 1 mg (1 mg Intravenous Given 10/25/23 0654)  insulin aspart (novoLOG) injection 8 Units (8 Units Subcutaneous Given 10/25/23 0734)  sodium chloride 0.9 % bolus 1,000 mL (0 mLs Intravenous Stopped 10/25/23 1107)  HYDROmorphone (DILAUDID) injection 1 mg (1 mg  Intravenous Given 10/25/23 1359)  iohexol (OMNIPAQUE) 300 MG/ML solution 100 mL (100 mLs  Intravenous Contrast Given 10/25/23 1246)    ED Course/ Medical Decision Making/ A&P Clinical Course as of 10/25/23 1518  Fri Oct 25, 2023  0645 Glucose-Capillary(!): 470 [CA]  0748 WBC: 9.1 [CA]  0748 Hemoglobin: 14.4 [CA]  0808 Glucose(!): 483 [CA]  0808 Creatinine: 0.95 [CA]  0808 Lipase: 36 [CA]  0808 Beta-Hydroxybutyric Acid(!): 1.32 [CA]  0808 pH, Ven: 7.43 [CA]  0808 Bicarbonate(!): 34.5 [CA]  0820 Reassessed patient at bedside.  Patient appears more comfortable in bed.  notes abdominal pain has significantly improved after medication. [CA]  1232 Reassessed patient at bedside.  Patient admits to worsening pain.  Patient does not feel comfortable going home due to significant abdominal pain.  CT abdomen ordered.  Will require admission for intractable abdominal pain. [CA]    Clinical Course User Index [CA] Mannie Stabile, PA-C                                 Medical Decision Making Amount and/or Complexity of Data Reviewed External Data Reviewed: notes. Labs: ordered. Decision-making details documented in ED Course. Radiology: ordered and independent interpretation performed. Decision-making details documented in ED Course. ECG/medicine tests: ordered and independent interpretation performed. Decision-making details documented in ED Course.  Risk OTC drugs. Prescription drug management.   This patient presents to the ED for concern of abdominal pain, this involves an extensive number of treatment options, and is a complaint that carries with it a high risk of complications and morbidity.  The differential diagnosis includes pancreatitis, PUD, bowel obstruction, DKA, etc  62 year old female presents to the ED due to generalized abdominal pain associate with nausea.  Has chronic abdominal pain.  Has known ulcer and has been taking Carafate twice daily per GI recommendations.  Had a recent EGD.  Last seen on 11/7 for abdominal pain.  CBG with EMS 555.  Patient has not  taken any insulin this morning.  Upon recheck at bedside during initial evaluation CBG 470.  Upon arrival patient afebrile, not tachycardic or hypoxic.  Patient appears uncomfortable in bed.  Abdomen soft, nondistended with diffuse tenderness.  Hyperglycemia labs ordered.  IV fluids and 8 units of NovoLog given. Diluadid and Phenergan given. GI cocktail.  Patient has had numerous CT scans in the past and patient notes this feels similar to her previous abdominal pain so we will hold off on CT abdomen at this time.  EKG to rule out atypical ACS.  CBC unremarkable.  No leukocytosis.  Normal hemoglobin.  CMP significant for hyponatremia 129.  Normal bicarb.  Hyperglycemia at 183.  No anion gap.  Normal creatinine.  VBG with normal pH. Beta hydroxybutyrate elevated 1.32 unclear etiology. Lower suspicion for current DKA, possible early DKA?? Starvation ketosis? Patient treated with IVFs and SubQ insulin. Discussed with Dr. Rhae Hammock who agrees with assessment and plan.   Rechecked CBG which was 226. Upon reassessment, patient admits to continued pain and nausea. Unable to tolerate po. She does not feel like she can control pain at home. CT abdomen ordered. Patient will require admission for intractable abdominal pain and nausea.   CBG rechecked which was 228.  Patient handed off to [employer], PA-C at shift change pending CT abdomen and admission.  Co morbidities that complicate the patient evaluation  Chronic abdominal pain Cardiac Monitoring: / EKG:  The patient was maintained on  a cardiac monitor.  I personally viewed and interpreted the cardiac monitored which showed an underlying rhythm of: NSR        Final Clinical Impression(s) / ED Diagnoses Final diagnoses:  Hyperglycemia  Generalized abdominal pain    Rx / DC Orders ED Discharge Orders     None         Jesusita Oka 10/25/23 1519    Durwin Glaze, MD 10/28/23 [phone]

## 2023-10-25 NOTE — ED Triage Notes (Signed)
  Patient BIB EMS for hyperglycemia and abdominal pain that has been going on since yesterday morning.  Patient states her CBG has been reading high even after taking her insulin.  Patient states she has hx of gastric ulcer that is causing pain.  CBG 555 per EMS.  Pain 10/10, sharp/stabbing.  Endorses nausea with no vomiting.

## 2023-10-25 NOTE — ED Notes (Signed)
CBG 228 

## 2023-10-25 NOTE — ED Notes (Signed)
CBG 226

## 2023-10-25 NOTE — Assessment & Plan Note (Addendum)
Going on since at least a couple of weeks, however worse in the last 72 hours with associated nausea and vomiting and intolerance of p.o. diet.  Patient has known peptic ulcer disease.  However currently reporting generalized abdominal discomfort.  Lipase is within normal limit, no finding of focal tenderness on abdominal exam.  I reviewed images of the CAT scan and it seems that the bladder is markedly distended.  I will request bladder scan and see if patient voids if not then will have Foley catheter placed will discuss with RN accordingly.  In the interim to continue antipeptic ulcer regimen for the patient.   At this time, while patient is n.p.o., we will continue with intravenous medications for pain and nausea vomiting control.

## 2023-10-25 NOTE — Assessment & Plan Note (Signed)
This is a chronic/remote diagnosis, patient is on Seroquel as well as sertraline at home, as well as seems to be taking trazodone at home.  However trazodone fill history is not showing patient actually taking this medication.  At this time I will not restart the trazodone, continue with Seroquel and sertraline.  If patient symptomatic/further input obtained, resume trazodone.

## 2023-10-25 NOTE — ED Provider Notes (Signed)
Handoff from C. Aberman PA. Here for intractable abdominal pain and nausea. Hx of multiple ulcers. Pending CT abd/pelvis  Physical Exam  BP (!) 144/104   Pulse 92   Temp 97.8 F (36.6 C) (Oral)   Resp 15   Ht 4\' 11"  (1.499 m)   Wt 56.7 kg   LMP 08/11/2012   SpO2 99%   BMI 25.25 kg/m   Physical Exam Vitals and nursing note reviewed.  Constitutional:      General: She is not in acute distress.    Appearance: She is well-developed.  HENT:     Head: Normocephalic and atraumatic.  Eyes:     Conjunctiva/sclera: Conjunctivae normal.  Cardiovascular:     Rate and Rhythm: Normal rate and regular rhythm.     Heart sounds: No murmur heard. Pulmonary:     Effort: Pulmonary effort is normal. No respiratory distress.     Breath sounds: Normal breath sounds.  Abdominal:     Palpations: Abdomen is soft.     Tenderness: There is generalized abdominal tenderness.  Musculoskeletal:        General: No swelling.     Cervical back: Neck supple.  Skin:    General: Skin is warm and dry.     Capillary Refill: Capillary refill takes less than 2 seconds.  Neurological:     Mental Status: She is alert.  Psychiatric:        Mood and Affect: Mood normal.     Procedures  Procedures  ED Course / MDM   Clinical Course as of 10/25/23 1510  Fri Oct 25, 2023  0645 Glucose-Capillary(!): 470 [CA]  0748 WBC: 9.1 [CA]  0748 Hemoglobin: 14.4 [CA]  0808 Glucose(!): 483 [CA]  0808 Creatinine: 0.95 [CA]  0808 Lipase: 36 [CA]  0808 Beta-Hydroxybutyric Acid(!): 1.32 [CA]  0808 pH, Ven: 7.43 [CA]  0808 Bicarbonate(!): 34.5 [CA]  0820 Reassessed patient at bedside.  Patient appears more comfortable in bed.  notes abdominal pain has significantly improved after medication. [CA]  1232 Reassessed patient at bedside.  Patient admits to worsening pain.  Patient does not feel comfortable going home due to significant abdominal pain.  CT abdomen ordered.  Will require admission for intractable abdominal  pain. [CA]    Clinical Course User Index [CA] Mannie Stabile, PA-C   Medical Decision Making Patient is a 62 year old female, here for abdominal pain, high glucose, has been going on for several days.  Has intractable pain, and is unable to tolerate p.o. intake.  EGD shows severe esophageal/duodenal ulcers, CT abdomen pelvis, shows thickening of the gallbladder versus polyp.  Given unable to tolerate p.o. intake, has intractable nausea, and abdominal pain, will admit to hospitalist for further management. Admitted to Dr.Goel for further management  Amount and/or Complexity of Data Reviewed Labs: ordered. Decision-making details documented in ED Course.    Details: Glucose is improved from my 300s to mid 200s, mildly elevated beta hydroxybutyrate Radiology: ordered.    Details: CT abdomen pelvis polyp versus adenomyomatosis   Risk OTC drugs. Prescription drug management.          Pete Pelt, [place] 10/25/23 1653    Melene Plan, DO 10/25/23 1726

## 2023-10-25 NOTE — H&P (Signed)
History and Physical    Patient: [name] [name]:[id number] DOB: 1961-08-18 DOA: 10/25/2023 DOS: the patient was seen and examined on 10/25/2023 PCP: Everardo All, NP  Patient coming from: Home  Chief Complaint:  Chief Complaint  Patient presents with   Hyperglycemia   Abdominal Pain   HPI: [name] [name] is a 62 y.o. female with medical history significant of known peptic ulcer disease that was diagnosed on endoscopy approximately a couple of weeks ago.  Patient reports that since then she has had some generalized abdominal discomfort, occasional vomiting.  However she states that for the last couple of days the generalized abdominal discomfort has become constant and severe.  Patient does not identify any aggravating or relieving factors.  Patient further reports bouts of nausea and vomiting, with very poor p.o. intake.  Denies any fever or diarrhea.  Denies any trauma no dysuria no flank pain.  ER course is notable for patient failing oral challenge with finding of marked hyperglycemia which has been controlled with insulin.  Medical evaluation is sought  At this time reports no new symptoms. Review of Systems: As mentioned in the history of present illness. All other systems reviewed and are negative. Past Medical History:  Diagnosis Date   Allergy    seasonal   Anxiety    B12 deficiency    Chest pain 04/05/2017   CHF (congestive heart failure) ([name])    Diabetes mellitus type 1, uncontrolled    "dr. 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) 10/29/2013   Leg cramping    "@ night" (09/18/2013)   Migraine    "once q couple months" (11/26/2018)   Osteoporosis    Peripheral neuropathy    Umbilical hernia     unrepaired (09/18/2013)   Past Surgical History:  Procedure Laterality Date   BIOPSY  06/03/2019   Procedure: BIOPSY;  Surgeon: Lynann Bologna, MD;  Location: [facility];  Service: Endoscopy;;   BIOPSY  08/17/2020   Procedure: BIOPSY;  Surgeon: Tressia Danas, MD;  Location: [facility];  Service: Gastroenterology;;   BIOPSY  02/05/2023   Procedure: BIOPSY;  Surgeon: Meryl Dare, MD;  Location: [facility];  Service: Gastroenterology;;   BIOPSY  09/24/2023   Procedure: BIOPSY;  Surgeon: Lemar Lofty., MD;  Location: [facility];  Service: Gastroenterology;;   CESAREAN SECTION  1989   ENTEROSCOPY N/A 06/03/2019   Procedure: ENTEROSCOPY;  Surgeon: Lynann Bologna, MD;  Location: [facility];  Service: Endoscopy;  Laterality: N/A;   ESOPHAGOGASTRODUODENOSCOPY (EGD) WITH PROPOFOL N/A 08/17/2020   Procedure: ESOPHAGOGASTRODUODENOSCOPY (EGD) WITH PROPOFOL;  Surgeon: Tressia Danas, MD;  Location: [facility];  Service: Gastroenterology;  Laterality: N/A;   ESOPHAGOGASTRODUODENOSCOPY (EGD) WITH PROPOFOL N/A 02/05/2023   Procedure: ESOPHAGOGASTRODUODENOSCOPY (EGD) WITH PROPOFOL;  Surgeon: Meryl Dare, MD;  Location: [facility];  Service: Gastroenterology;  Laterality: N/A;   ESOPHAGOGASTRODUODENOSCOPY (EGD) WITH PROPOFOL N/A 09/24/2023   Procedure: ESOPHAGOGASTRODUODENOSCOPY (EGD) WITH PROPOFOL;  Surgeon: Meridee Score Netty Starring., MD;  Location: [facility];  Service: Gastroenterology;  Laterality: N/A;   EUS N/A 09/24/2023   Procedure: UPPER ENDOSCOPIC ULTRASOUND (EUS) LINEAR;  Surgeon: Lemar Lofty., MD;  Location: [facility];  Service: Gastroenterology;  Laterality: N/A;   LAPAROSCOPIC ASSISTED  VAGINAL HYSTERECTOMY  09/23/2012   Procedure: LAPAROSCOPIC ASSISTED VAGINAL HYSTERECTOMY;  Surgeon: Mitchel Honour, DO;  Location: [facility];  Service: Gynecology;  Laterality: N/A;  pull Dr Vance Gather instrument   LEFT HEART CATH AND CORONARY ANGIOGRAPHY N/A 02/11/2017    Procedure: Left Heart Cath and Coronary Angiography;  Surgeon: Runell Gess, MD;  Location: [facility];  Service: Cardiovascular;  Laterality: N/A;   TUBAL LIGATION Bilateral 1992   Social History:  reports that she quit smoking about 32 years ago. Her smoking use included cigarettes. She started smoking about 42 years ago. She has a 1.2 pack-year smoking history. She has never used smokeless tobacco. She reports that she does not currently use drugs after having used the following drugs: Marijuana. She reports that she does not drink alcohol.  Allergies  Allergen Reactions   Codeine Hives, Anxiety and Other (See Comments)    Family History  Problem Relation Age of Onset   Diabetes Other    Heart disease Other    Heart failure Mother 70       Her heart skipped a beat and stopped - per pt   Diabetes Maternal Grandmother    Alzheimer's disease Maternal Grandmother    Coronary artery disease Maternal Grandmother    Heart attack Maternal Grandfather    Heart failure Brother    Diabetes Brother    Colon polyps Neg Hx    Esophageal cancer Neg Hx    Liver cancer Neg Hx    Pancreatic cancer Neg Hx    Stomach cancer Neg Hx    Crohn's disease Neg Hx    Rectal cancer Neg Hx     Prior to Admission medications   Medication Sig Start Date End Date Taking? Authorizing Provider  aspirin EC 81 MG tablet Take 81 mg by mouth daily. Swallow whole.   Yes [provider]  dicyclomine (BENTYL) 20 MG tablet Take 1 tablet (20 mg total) by mouth 2 (two) times daily. 10/17/23  Yes Ali, Amjad, PA-C  escitalopram (LEXAPRO) 10 MG tablet Take 10 mg by mouth daily. 09/09/23  Yes [provider]  Glucagon, rDNA, (GLUCAGON EMERGENCY) 1 MG KIT Inject 1 mg into the skin See admin instructions. Inject one mg into the skin see administration instructions. Follow package directions for low blood sugar. 06/18/23  Yes [provider]  hyoscyamine (LEVSIN/SL) 0.125 MG SL tablet Place 1  tablet (0.125 mg total) under the tongue every 4 (four) hours as needed. 09/21/23  Yes Pollina, Canary Brim, MD  levothyroxine (SYNTHROID) 137 MCG tablet Take 137 mcg by mouth daily before breakfast. 03/19/22  Yes [provider]  metoCLOPramide (REGLAN) 5 MG tablet Take 1 tablet (5 mg total) by mouth 2 (two) times daily. Take 30 minutes before breakfast and dinner. 10/21/23  Yes Arnaldo Natal, NP  midodrine (PROAMATINE) 5 MG tablet Take 1 tablet (5 mg total) by mouth 2 (two) times daily with a meal. Hold if your systolic blood pressure  ( top number) is > , please check your blood pressure at home once a day and bring in record for your doctor to review, follow up with your pcp and cardiology regarding your blood pressure medication Patient taking differently: Take 5 mg by mouth daily. 02/06/23  Yes Albertine Grates, MD  NOVOLOG FLEXPEN 100 UNIT/ML FlexPen Inject 4 Units into the skin 3 (three) times daily with meals.   Yes [provider]  omega-3 acid ethyl esters (LOVAZA) 1 g capsule Take 2  capsules (2 g total) by mouth 2 (two) times daily. 09/13/23  Yes Alwyn Ren, MD  ondansetron (ZOFRAN) 4 MG tablet Take 1 tablet (4 mg total) by mouth every 6 (six) hours as needed for nausea. 09/13/23  Yes Alwyn Ren, MD  pantoprazole (PROTONIX) 40 MG tablet Take 1 tablet (40 mg total) by mouth 2 (two) times daily. 09/25/23 10/25/23 Yes Tyrone Nine, MD  promethazine (PHENERGAN) 25 MG suppository Place 1 suppository (25 mg total) rectally every 6 (six) hours as needed for nausea or vomiting. 10/05/23  Yes Renne Crigler, PA-C  promethazine (PHENERGAN) 25 MG tablet Take 1 tablet (25 mg total) by mouth every 6 (six) hours as needed for nausea or vomiting. 10/05/23  Yes Renne Crigler, PA-C  QUEtiapine (SEROQUEL) 50 MG tablet Take 50 mg by mouth at bedtime. 09/09/23  Yes [provider]  ranolazine (RANEXA) 500 MG 12 hr tablet Take 500 mg by mouth 2 (two) times  daily. 07/23/23  Yes [provider]  rosuvastatin (CRESTOR) 10 MG tablet Take 10 mg by mouth daily. 06/17/23  Yes [provider]  sucralfate (CARAFATE) 1 GM/10ML suspension Take 10 mLs (1 g total) by mouth 2 (two) times daily for 10 days. Do not take within 2 hours of any other medications. 10/21/23 10/31/23 Yes Arnaldo Natal, NP  tamsulosin (FLOMAX) 0.4 MG CAPS capsule Take 1 capsule (0.4 mg total) by mouth daily after supper. 09/04/23  Yes Arnetha Courser, MD  TOUJEO SOLOSTAR 300 UNIT/ML Solostar Pen Inject 12 Units into the skin at bedtime. Patient taking differently: Inject 8 Units into the skin at bedtime. 08/24/23  Yes Lewie Chamber, MD  traZODone (DESYREL) 50 MG tablet Take 50 mg by mouth at bedtime.   Yes [provider]  Continuous Blood Gluc Sensor (DEXCOM G6 SENSOR) MISC INJECT 1 SENSOR INTO SKIN EVERY 10 DAYS Patient taking differently: Inject 1 Device into the skin See admin instructions. Place 1 new device into the skin every 10 days 07/18/22   Excell Seltzer, MD  Continuous Blood Gluc Transmit (DEXCOM G6 TRANSMITTER) MISC USE AS DIRECTED FOR CONTINUOUS GLUCOSE MONITORING. REUSE TRANSMITTER X 90DAYS THEN DISCARD & REPLACE 07/05/21   Bedsole, Amy E, MD  HYDROcodone-acetaminophen (NORCO/VICODIN) 5-325 MG tablet Take 1 tablet by mouth every 4 (four) hours as needed for moderate pain. Patient not taking: Reported on 10/25/2023 09/21/23   Gilda Crease, MD  ondansetron (ZOFRAN-ODT) 4 MG disintegrating tablet Take 1 tablet (4 mg total) by mouth every 8 (eight) hours as needed. Patient not taking: Reported on 10/25/2023 10/17/23   Marita Kansas, PA-C  sucralfate (CARAFATE) 1 g tablet Take 1 tablet (1 g total) by mouth 4 (four) times daily -  with meals and at bedtime for 14 days. Patient not taking: Reported on 10/25/2023 09/25/23 10/09/23  Tyrone Nine, MD    Physical Exam: Vitals:   10/25/23 1002 10/25/23 1304 10/25/23 1600 10/25/23 1646  BP: (!)  132/98 (!) 144/104 (!) 152/97 (!) 129/101  Pulse: (!) 109 92 91 (!) 102  Resp: 16 15 16 16   Temp: 98 F (36.7 C) 97.8 F (36.6 C)  97.8 F (36.6 C)  TempSrc: Oral Oral  Oral  SpO2: 100% 99% 94% 100%  Weight:      Height:       General: Patient was initially in no distress, working on her phone, smiling.  However subsequently during exam had a sudden bout of nausea and abdominal discomfort prompting the patient to  get restless.  Otherwise no physiologic distress apparent.patient gives coherent account of symptoms. Surgery exam: Bilateral intravesicular Cardiovascular exam S1-S2 normal Abdomen: Appears distended, may be chronic due to obesity.  Bowel sounds are diminished, all quadrants soft nontender Extremities warm without edema. Data Reviewed:  Labs on Admission:  Results for orders placed or performed during the hospital encounter of 10/25/23 (from the past 24 hour(s))  CBG monitoring, ED     Status: Abnormal   Collection Time: 10/25/23  6:43 AM  Result Value Ref Range   Glucose-Capillary 470 (H) 70 - 99 mg/dL  CBC with Differential (PNL)     Status: None   Collection Time: 10/25/23  7:03 AM  Result Value Ref Range   WBC 9.1 4.0 - 10.5 K/uL   RBC 5.03 3.87 - 5.11 MIL/uL   Hemoglobin 14.4 12.0 - 15.0 g/dL   HCT 44.0 34.7 - 42.5 %   MCV 85.9 80.0 - 100.0 fL   MCH 28.6 26.0 - 34.0 pg   MCHC 33.3 30.0 - 36.0 g/dL   RDW 95.6 38.7 - 56.4 %   Platelets 355 150 - 400 K/uL   nRBC 0.0 0.0 - 0.2 %   Neutrophils Relative % 69 %   Neutro Abs 6.3 1.7 - 7.7 K/uL   Lymphocytes Relative 21 %   Lymphs Abs 1.9 0.7 - 4.0 K/uL   Monocytes Relative 4 %   Monocytes Absolute 0.4 0.1 - 1.0 K/uL   Eosinophils Relative 5 %   Eosinophils Absolute 0.5 0.0 - 0.5 K/uL   Basophils Relative 0 %   Basophils Absolute 0.0 0.0 - 0.1 K/uL   Immature Granulocytes 1 %   Abs Immature Granulocytes 0.05 0.00 - 0.07 K/uL  Comprehensive metabolic panel     Status: Abnormal   Collection Time: 10/25/23  7:03  AM  Result Value Ref Range   Sodium 129 (L) 135 - 145 mmol/L   Potassium 4.0 3.5 - 5.1 mmol/L   Chloride 88 (L) 98 - 111 mmol/L   CO2 27 22 - 32 mmol/L   Glucose, Bld 483 (H) 70 - 99 mg/dL   BUN 28 (H) 6 - 20 mg/dL   Creatinine, Ser 3.32 0.44 - 1.00 mg/dL   Calcium 9.4 8.9 - 95.1 mg/dL   Total Protein 8.3 (H) 6.5 - 8.1 g/dL   Albumin 4.1 3.5 - 5.0 g/dL   AST 20 15 - 41 U/L   ALT 13 0 - 44 U/L   Alkaline Phosphatase 75 38 - 126 U/L   Total Bilirubin 0.7 <1.2 mg/dL   GFR, Estimated >88 >41 mL/min   Anion gap 14 5 - 15  Lipase, blood     Status: None   Collection Time: 10/25/23  7:03 AM  Result Value Ref Range   Lipase 36 11 - 51 U/L  Beta-hydroxybutyric acid     Status: Abnormal   Collection Time: 10/25/23  7:03 AM  Result Value Ref Range   Beta-Hydroxybutyric Acid 1.32 (H) 0.05 - 0.27 mmol/L  Blood gas, venous     Status: Abnormal   Collection Time: 10/25/23  7:43 AM  Result Value Ref Range   pH, Ven 7.43 7.25 - 7.43   pCO2, Ven 52 44 - 60 mmHg   pO2, Ven 51 (H) 32 - 45 mmHg   Bicarbonate 34.5 (H) 20.0 - 28.0 mmol/L   Acid-Base Excess 8.5 (H) 0.0 - 2.0 mmol/L   O2 Saturation 83.3 %   Patient temperature 37.0   POC CBG,  ED     Status: Abnormal   Collection Time: 10/25/23  8:57 AM  Result Value Ref Range   Glucose-Capillary 392 (H) 70 - 99 mg/dL  Urinalysis, Routine w reflex microscopic -Urine, Clean Catch     Status: Abnormal   Collection Time: 10/25/23  9:09 AM  Result Value Ref Range   Color, Urine YELLOW YELLOW   APPearance CLEAR CLEAR   Specific Gravity, Urine 1.024 1.005 - 1.030   pH 7.0 5.0 - 8.0   Glucose, UA >=500 (A) NEGATIVE mg/dL   Hgb urine dipstick NEGATIVE NEGATIVE   Bilirubin Urine NEGATIVE NEGATIVE   Ketones, ur 5 (A) NEGATIVE mg/dL   Protein, ur NEGATIVE NEGATIVE mg/dL   Nitrite NEGATIVE NEGATIVE   Leukocytes,Ua NEGATIVE NEGATIVE   RBC / HPF 0-5 0 - 5 RBC/hpf   WBC, UA 0-5 0 - 5 WBC/hpf   Bacteria, UA RARE (A) NONE SEEN   Squamous Epithelial /  HPF 0-5 0 - 5 /HPF   Mucus PRESENT    Basic Metabolic Panel: Recent Labs  Lab 10/21/23 0959 10/25/23 0703  NA 131* 129*  K 4.3 4.0  CL 94* 88*  CO2 25 27  GLUCOSE 466* 483*  BUN 24* 28*  CREATININE 0.81 0.95  CALCIUM 9.6 9.4   Liver Function Tests: Recent Labs  Lab 10/25/23 0703  AST 20  ALT 13  ALKPHOS 75  BILITOT 0.7  PROT 8.3*  ALBUMIN 4.1   Recent Labs  Lab 10/21/23 0959 10/25/23 0703  LIPASE 27.0 36   No results for input(s): "AMMONIA" in the last 168 hours. CBC: Recent Labs  Lab 10/21/23 0959 10/25/23 0703  WBC 12.3* 9.1  NEUTROABS 7.3 6.3  HGB 13.7 14.4  HCT 41.2 43.2  MCV 87.4 85.9  PLT 356.0 355   Cardiac Enzymes: No results for input(s): "CKTOTAL", "CKMB", "CKMBINDEX", "TROPONINIHS" in the last 168 hours.  BNP (last 3 results) No results for input(s): "PROBNP" in the last 8760 hours. CBG: Recent Labs  Lab 10/25/23 0643 10/25/23 0857  GLUCAP 470* 392*    Radiological Exams on Admission:  CT ABDOMEN PELVIS W CONTRAST  Result Date: 10/25/2023 CLINICAL DATA:  Left lower quadrant pain and nausea beginning this morning. EXAM: CT ABDOMEN AND PELVIS WITH CONTRAST TECHNIQUE: Multidetector CT imaging of the abdomen and pelvis was performed using the standard protocol following bolus administration of intravenous contrast. RADIATION DOSE REDUCTION: This exam was performed according to the departmental dose-optimization program which includes automated exposure control, adjustment of the mA and/or kV according to patient size and/or use of iterative reconstruction technique. CONTRAST:  OMNIPAQUE IOHEXOL 300 MG/ML  SOLN COMPARISON:  CT on 09/22/2023 FINDINGS: Lower Chest: No acute findings. Hepatobiliary: No suspicious hepatic masses identified. A focal enhancing nodular density is seen in the gallbladder fundus measuring approximately 1 cm. This remains stable since recent study, and could be due to gallbladder polyp/neoplasm or focal adenomyosis.  No No evidence of biliary obstruction. Pancreas:  No mass or inflammatory changes. Spleen: Within normal limits in size and appearance. Adrenals/Urinary Tract: No suspicious masses identified. No evidence of ureteral calculi or hydronephrosis. Stomach/Bowel: No evidence of obstruction, inflammatory process or abnormal fluid collections. Normal appendix visualized. Diffuse colonic diverticulosis is again seen, without signs of diverticulitis. Vascular/Lymphatic: No pathologically enlarged lymph nodes. No acute vascular findings. Reproductive: Prior hysterectomy noted. Adnexal regions are unremarkable in appearance. Other:  Stable small umbilical hernia, which contains only fat. Musculoskeletal:  No suspicious bone lesions identified. IMPRESSION: No acute findings.  Colonic diverticulosis, without radiographic evidence of diverticulitis. Stable 1 cm enhancing nodular density in gallbladder fundus, which could be due to gallbladder polyp/neoplasm or focal adenomyomatosis. Recommend abdomen ultrasound for further evaluation. Stable small umbilical hernia, which contains only fat. Electronically Signed   By: Danae Orleans M.D.   On: 10/25/2023 16:17    EKG: Independently reviewed. NSR   Assessment and Plan: * Abdominal pain Going on since at least a couple of weeks, however worse in the last 72 hours with associated nausea and vomiting and intolerance of p.o. diet.  Patient has known peptic ulcer disease.  However currently reporting generalized abdominal discomfort.  Lipase is within normal limit, no finding of focal tenderness on abdominal exam.  I reviewed images of the CAT scan and it seems that the bladder is markedly distended.  I will request bladder scan and see if patient voids if not then will have Foley catheter placed will discuss with RN accordingly.  In the interim to continue antipeptic ulcer regimen for the patient.   At this time, while patient is n.p.o., we will continue with intravenous  medications for pain and nausea vomiting control.  Gall bladder polyp Incidentally noted, I do not think this would explain patient's current presentation.  See CAT scan report above.  Was previously documented in September 22, 2023.  Subsequently went an upper endoscopy ultrasound on September 24, 2023 with no concerning findings documented related to the gallbladder and the report.  GI note by Dr. Clinically no on September 25, 2023 mentions the nodule at the gallbladder fundus, and suggest patient should get an outpatient surgical consultation.  Consider same in the morning please  Hyperglycemia Patient has known diabetes mellitus insulin-dependent, is supposed to be on Trejo 8 units daily as well as NovoLog 4 units 3 times a day under the skin.  However it seems that the pharmacist reviewed patient's dispense report and noticed that patient has not filled this medication in a long time.  However patient claims to be taking this medication.  At this time we will start with insulin sliding scale, I will continue with long-acting insulin 8 units basal.  Patient glucose was 483, patient did receive short acting insulin 8 units in the ER, now trended down to 200s.  DKA is not suspected as bicarbonate level is actually normal with normal anion gap.  Although there is slight elevation in beta hydroxybutyrate that may be ascribed to fasting   Addendum : 7 PM - bladder scan . Plan for foley   Advance Care Planning:   Code Status: Prior full code.  Consults: consider surgical input routinely in AM  or as outpatient as dicussed above.  Family Communication: per patient.  Severity of Illness: The appropriate patient status for this patient is OBSERVATION. Observation status is judged to be reasonable and necessary in order to provide the required intensity of service to ensure the patient's safety. The patient's presenting symptoms, physical exam findings, and initial radiographic and laboratory data in  the context of their medical condition is felt to place them at decreased risk for further clinical deterioration. Furthermore, it is anticipated that the patient will be medically stable for discharge from the hospital within 2 midnights of admission.   Author: Nolberto Hanlon, MD 10/25/2023 5:52 PM  For on call review [email or web address].

## 2023-10-25 NOTE — Assessment & Plan Note (Addendum)
Patient has known diabetes mellitus insulin-dependent, is supposed to be on Trejo 8 units daily as well as NovoLog 4 units 3 times a day under the skin.  However it seems that the pharmacist reviewed patient's dispense report and noticed that patient has not filled this medication in a long time.  However patient claims to be taking this medication.  At this time we will start with insulin sliding scale, I will continue with long-acting insulin 8 units basal.  Patient glucose was 483, patient did receive short acting insulin 8 units in the ER, now trended down to 200s.  DKA is not suspected as bicarbonate level is actually normal with normal anion gap.  Although there is slight elevation in beta hydroxybutyrate that may be ascribed to fasting

## 2023-10-25 NOTE — Assessment & Plan Note (Signed)
Incidentally noted, I do not think this would explain patient's current presentation.  See CAT scan report above.  Was previously documented in September 22, 2023.  Subsequently went an upper endoscopy ultrasound on September 24, 2023 with no concerning findings documented related to the gallbladder and the report.  GI note by Dr. Clinically no on September 25, 2023 mentions the nodule at the gallbladder fundus, and suggest patient should get an outpatient surgical consultation.  Consider same in the morning please

## 2023-10-26 DIAGNOSIS — K861 Other chronic pancreatitis: Secondary | ICD-10-CM | POA: Diagnosis present

## 2023-10-26 DIAGNOSIS — K2980 Duodenitis without bleeding: Secondary | ICD-10-CM | POA: Diagnosis present

## 2023-10-26 DIAGNOSIS — K2970 Gastritis, unspecified, without bleeding: Secondary | ICD-10-CM | POA: Diagnosis present

## 2023-10-26 DIAGNOSIS — R109 Unspecified abdominal pain: Secondary | ICD-10-CM | POA: Diagnosis present

## 2023-10-26 DIAGNOSIS — K3184 Gastroparesis: Secondary | ICD-10-CM | POA: Diagnosis not present

## 2023-10-26 DIAGNOSIS — I130 Hypertensive heart and chronic kidney disease with heart failure and stage 1 through stage 4 chronic kidney disease, or unspecified chronic kidney disease: Secondary | ICD-10-CM | POA: Diagnosis present

## 2023-10-26 DIAGNOSIS — I480 Paroxysmal atrial fibrillation: Secondary | ICD-10-CM | POA: Diagnosis not present

## 2023-10-26 DIAGNOSIS — F419 Anxiety disorder, unspecified: Secondary | ICD-10-CM | POA: Diagnosis present

## 2023-10-26 DIAGNOSIS — E039 Hypothyroidism, unspecified: Secondary | ICD-10-CM | POA: Diagnosis not present

## 2023-10-26 DIAGNOSIS — Z794 Long term (current) use of insulin: Secondary | ICD-10-CM | POA: Diagnosis not present

## 2023-10-26 DIAGNOSIS — K269 Duodenal ulcer, unspecified as acute or chronic, without hemorrhage or perforation: Secondary | ICD-10-CM | POA: Diagnosis present

## 2023-10-26 DIAGNOSIS — Z87891 Personal history of nicotine dependence: Secondary | ICD-10-CM | POA: Diagnosis not present

## 2023-10-26 DIAGNOSIS — Z79890 Hormone replacement therapy (postmenopausal): Secondary | ICD-10-CM | POA: Diagnosis not present

## 2023-10-26 DIAGNOSIS — I9589 Other hypotension: Secondary | ICD-10-CM | POA: Diagnosis present

## 2023-10-26 DIAGNOSIS — E871 Hypo-osmolality and hyponatremia: Secondary | ICD-10-CM | POA: Diagnosis present

## 2023-10-26 DIAGNOSIS — E1043 Type 1 diabetes mellitus with diabetic autonomic (poly)neuropathy: Secondary | ICD-10-CM | POA: Diagnosis present

## 2023-10-26 DIAGNOSIS — F1210 Cannabis abuse, uncomplicated: Secondary | ICD-10-CM | POA: Diagnosis not present

## 2023-10-26 DIAGNOSIS — E785 Hyperlipidemia, unspecified: Secondary | ICD-10-CM | POA: Diagnosis present

## 2023-10-26 DIAGNOSIS — Z8249 Family history of ischemic heart disease and other diseases of the circulatory system: Secondary | ICD-10-CM | POA: Diagnosis not present

## 2023-10-26 DIAGNOSIS — N182 Chronic kidney disease, stage 2 (mild): Secondary | ICD-10-CM | POA: Diagnosis present

## 2023-10-26 DIAGNOSIS — K2210 Ulcer of esophagus without bleeding: Secondary | ICD-10-CM | POA: Diagnosis present

## 2023-10-26 DIAGNOSIS — I5032 Chronic diastolic (congestive) heart failure: Secondary | ICD-10-CM | POA: Diagnosis present

## 2023-10-26 DIAGNOSIS — R339 Retention of urine, unspecified: Secondary | ICD-10-CM | POA: Diagnosis present

## 2023-10-26 DIAGNOSIS — F32A Depression, unspecified: Secondary | ICD-10-CM | POA: Diagnosis present

## 2023-10-26 DIAGNOSIS — E1065 Type 1 diabetes mellitus with hyperglycemia: Secondary | ICD-10-CM | POA: Diagnosis present

## 2023-10-26 DIAGNOSIS — K824 Cholesterolosis of gallbladder: Secondary | ICD-10-CM | POA: Diagnosis present

## 2023-10-26 DIAGNOSIS — I10 Essential (primary) hypertension: Secondary | ICD-10-CM | POA: Diagnosis not present

## 2023-10-26 DIAGNOSIS — R1084 Generalized abdominal pain: Secondary | ICD-10-CM | POA: Diagnosis not present

## 2023-10-26 DIAGNOSIS — R739 Hyperglycemia, unspecified: Secondary | ICD-10-CM | POA: Diagnosis not present

## 2023-10-26 LAB — RAPID URINE DRUG SCREEN, HOSP PERFORMED
Amphetamines: NOT DETECTED
Barbiturates: NOT DETECTED
Benzodiazepines: NOT DETECTED
Cocaine: NOT DETECTED
Opiates: NOT DETECTED
Tetrahydrocannabinol: POSITIVE — AB

## 2023-10-26 LAB — BASIC METABOLIC PANEL
Anion gap: 10 (ref 5–15)
BUN: 14 mg/dL (ref 6–20)
CO2: 23 mmol/L (ref 22–32)
Calcium: 8.2 mg/dL — ABNORMAL LOW (ref 8.9–10.3)
Chloride: 94 mmol/L — ABNORMAL LOW (ref 98–111)
Creatinine, Ser: 0.56 mg/dL (ref 0.44–1.00)
GFR, Estimated: >60 mL/min (ref >60)
Glucose, Bld: 346 mg/dL — ABNORMAL HIGH (ref 70–99)
Potassium: 3.5 mmol/L (ref 3.5–5.1)
Sodium: 127 mmol/L — ABNORMAL LOW (ref 135–145)

## 2023-10-26 LAB — PROTIME-INR
INR: 1 (ref 0.8–1.2)
Prothrombin Time: 13.1 s (ref 11.4–15.2)

## 2023-10-26 LAB — GLUCOSE, CAPILLARY
Glucose-Capillary: 313 mg/dL — ABNORMAL HIGH (ref 70–99)
Glucose-Capillary: 171 mg/dL — ABNORMAL HIGH (ref 70–99)
Glucose-Capillary: 147 mg/dL — ABNORMAL HIGH (ref 70–99)
Glucose-Capillary: 44 mg/dL — CL (ref 70–99)
Glucose-Capillary: 209 mg/dL — ABNORMAL HIGH (ref 70–99)

## 2023-10-26 LAB — CBC
HCT: 40.9 % (ref 36.0–46.0)
Hemoglobin: 13.2 g/dL (ref 12.0–15.0)
MCH: 28.6 pg (ref 26.0–34.0)
MCHC: 32.3 g/dL (ref 30.0–36.0)
MCV: 88.5 fL (ref 80.0–100.0)
Platelets: 334 10*3/uL (ref 150–400)
RBC: 4.62 MIL/uL (ref 3.87–5.11)
RDW: 14.4 % (ref 11.5–15.5)
WBC: 10.5 10*3/uL (ref 4.0–10.5)
nRBC: 0 % (ref 0.0–0.2)

## 2023-10-26 LAB — APTT: aPTT: 25 s (ref 24–36)

## 2023-10-26 LAB — TSH: TSH: 6.562 u[IU]/mL — ABNORMAL HIGH (ref 0.350–4.500)

## 2023-10-26 MED ORDER — HYDROCODONE-ACETAMINOPHEN 5-325 MG PO TABS: 1.0000 | ORAL_TABLET | Freq: Four times a day (QID) | ORAL | Status: DC | PRN

## 2023-10-26 MED ORDER — INSULIN ASPART 100 UNIT/ML IJ SOLN
4.0000 [IU] | Freq: Three times a day (TID) | INTRAMUSCULAR | Status: DC
  Administered 2023-10-26: 4 [IU] via SUBCUTANEOUS

## 2023-10-26 MED ORDER — SODIUM CHLORIDE 0.9 % IV BOLUS
500.0000 mL | Freq: Once | INTRAVENOUS | Status: AC
  Administered 2023-10-26: 500 mL via INTRAVENOUS

## 2023-10-26 MED ORDER — MIDODRINE HCL 5 MG PO TABS
5.0000 mg | ORAL_TABLET | ORAL | Status: AC
  Administered 2023-10-26: 5 mg via ORAL
  Filled 2023-10-26: qty 1

## 2023-10-26 MED ORDER — DEXTROSE 50 % IV SOLN
INTRAVENOUS | Status: AC
  Filled 2023-10-26: qty 50

## 2023-10-26 MED ORDER — INSULIN GLARGINE-YFGN 100 UNIT/ML ~~LOC~~ SOLN
15.0000 [IU] | Freq: Every day | SUBCUTANEOUS | Status: DC
  Administered 2023-10-26: 15 [IU] via SUBCUTANEOUS
  Filled 2023-10-26 (×2): qty 0.15

## 2023-10-26 MED ORDER — METOCLOPRAMIDE HCL 10 MG PO TABS
5.0000 mg | ORAL_TABLET | Freq: Three times a day (TID) | ORAL | Status: DC
  Administered 2023-10-26 – 2023-10-28 (×9): 5 mg via ORAL
  Filled 2023-10-26 (×9): qty 1

## 2023-10-26 MED ORDER — CHLORHEXIDINE GLUCONATE CLOTH 2 % EX PADS
6.0000 | MEDICATED_PAD | Freq: Every day | CUTANEOUS | Status: DC
  Administered 2023-10-26 – 2023-10-27 (×2): 6 via TOPICAL

## 2023-10-26 MED ORDER — SODIUM CHLORIDE 0.9 % IV BOLUS: 500.0000 mL | Freq: Once | INTRAVENOUS | Status: DC

## 2023-10-26 NOTE — Plan of Care (Signed)
  Problem: Coping: Goal: Ability to adjust to condition or change in health will improve Outcome: Progressing   Problem: Health Behavior/Discharge Planning: Goal: Ability to identify and utilize available resources and services will improve Outcome: Progressing   Problem: Metabolic: Goal: Ability to maintain appropriate glucose levels will improve Outcome: Progressing   Problem: Clinical Measurements: Goal: Ability to maintain clinical measurements within normal limits will improve Outcome: Progressing   Problem: Pain Management: Goal: General experience of comfort will improve Outcome: Progressing   Problem: Safety: Goal: Ability to remain free from injury will improve Outcome: Progressing

## 2023-10-26 NOTE — Progress Notes (Signed)
PROGRESS NOTE  [name] [name] OZH:[id number] DOB: Aug 13, 1961   PCP: Everardo All, NP  Patient is from: Home  DOA: 10/25/2023 LOS: 0  Chief complaints Chief Complaint  Patient presents with   Hyperglycemia   Abdominal Pain     Brief Narrative / Interim history: 62 year old F with poorly controlled T1DM (HbA1c >15.5%), gastroparesis, pancreatitis, diastolic CHF, paroxysmal A-fib not on AC, HTN, hypothyroidism, depression, gastritis/duodenitis/duodenal ulcer and marijuana use presented to ED with progressive abdominal pain.  Patient had EGD/EUS about 2 weeks ago that showed severe LA grade D erosive esophagitis, gastritis and duodenal ulcer.  She was started on PPI and Carafate.  Reports worsening generalized abdominal pain since EGD.  She also had associated nausea and vomiting intermittently.  CT abdomen and pelvis without significant finding other than known 1 cm gallbladder polyp or nodule.  CMP with pseudohyponatremia in the setting of hyperglycemia to 483.  LFT and lipase within normal.  CBC and VBG without significant finding.  UA with some glucosuria and mild ketonuria.  UDS positive for THC.  Patient was started on IVF, antiemetics and insulin and admitted for further care.   Subjective: Seen and examined earlier this morning.  No major events overnight of this morning.  Continues to endorse significant abdominal pain.  She rates her pain 8/10.  Pain is mainly over LLQ but she attributes this to "stomach ulcer".  She also reports associated nausea.  Extensive discussion about possible flare of gastroparesis and risk and benefit of opiate medications for pain.  We have discussed about scheduling Reglan.  She is currently on clear liquid diet  Objective: Vitals:   10/25/23 2359 10/26/23 0116 10/26/23 0454 10/26/23 0836  BP: 100/65 108/75 114/72 119/78  Pulse: 78 97 81 83  Resp: 16 18 18 18   Temp: 97.6 F (36.4 C) 97.9 F (36.6 C) 97.8 F (36.6 C) 98.2 F (36.8 C)  TempSrc:  Oral Oral Oral   SpO2: 100% 99% 95% 100%  Weight:      Height:        Examination:  GENERAL: No apparent distress.  Nontoxic. HEENT: MMM.  Vision and hearing grossly intact.  NECK: Supple.  No apparent JVD.  RESP:  No IWOB.  Fair aeration bilaterally. CVS:  RRR. Heart sounds normal.  ABD/GI/GU: BS+. Abd soft.  Diffuse tenderness.  No rebound or guarding. MSK/EXT:  Moves extremities. No apparent deformity. No edema.  SKIN: no apparent skin lesion or wound NEURO: Awake, alert and oriented appropriately.  No apparent focal neuro deficit. PSYCH: Calm. Normal affect.   Procedures:  None  Microbiology summarized: None  Assessment and plan: Generalized abdominal pain with associated nausea and vomiting: I suspect gastroparesis in the setting of poorly controlled diabetes.  His A1c was > 15.5% about 2 months ago.  She also have history of chronic pancreatitis, severe esophagitis, gastritis and duodenal ulcer.  LFT and lipase is within normal.  CT abdomen and pelvis without acute finding.  She might have some functional issues.  It seems she was on Bentyl and Levsin at home. -Continue clear liquid diet -Scheduled IV Reglan ACHS -Encouraged minimizing opiate use for pain control -Continue PPI and Carafate -Optimize glycemic control  Poorly controlled T1DM with hyperglycemia and gastroparesis: A1c > 15.5% in 08/2023. Recent Labs  Lab 10/25/23 0857 10/25/23 1114 10/25/23 1428 10/25/23 2127 10/26/23 0759  GLUCAP 392* 228* 228* 279* 313*  -Continue SSI-moderate -Add NovoLog 4 units 3 times daily with meals -Add Semglee 15 units daily -Scheduled  Reglan as above -Further adjustment as appropriate   Chronic hypotension?.  Blood pressure slightly elevated on arrival.  Now normotensive.  Seems she is on midodrine at home. -Hold midodrine.  Acute urinary retention: Bladder scan showed 675 cc urine and Foley catheter was inserted. -Continue Foley catheter for now -Voiding trial in the  next 1 to 2 days  Anxiety and depression: Stable -Continue home meds  Gall bladder polyp: Doubt this is the cause of her pain.  Not a new finding.  LFT and lipase within normal. -Outpatient follow-up with general surgery  Paroxysmal A-fib?  Currently in sinus rhythm.  Not on rate or rhythm control or anticoagulation.  Marijuana use: -Encouraged cessation.  Hypothyroidism -Continue home Synthroid   Body mass index is 24.4 kg/m.          DVT prophylaxis:  enoxaparin (LOVENOX) injection 40 mg Start: 10/26/23 0800 SCDs Start: 10/25/23 1818  Code Status: Full code Family Communication: None at bedside Level of care: Med-Surg Status is: Observation The patient will require care spanning > 2 midnights and should be moved to inpatient because: Generalized abdominal pain with associated nausea and vomiting   Final disposition: Likely home once medically stable Consultants:  None  55 minutes with more than 50% spent in reviewing records, counseling patient/family and coordinating care.   Sch Meds:  Scheduled Meds:  aspirin EC  81 mg Oral Daily   Chlorhexidine Gluconate Cloth  6 each Topical Daily   enoxaparin (LOVENOX) injection  40 mg Subcutaneous Q24H   escitalopram  10 mg Oral Daily   insulin aspart  0-15 Units Subcutaneous TID WC   insulin aspart  0-5 Units Subcutaneous QHS   insulin aspart  4 Units Subcutaneous TID WC   insulin glargine-yfgn  15 Units Subcutaneous Daily   levothyroxine  137 mcg Oral QAC breakfast   metoCLOPramide  5 mg Oral TID AC & HS   pantoprazole  40 mg Oral BID   QUEtiapine  50 mg Oral QHS   ranolazine  500 mg Oral BID   rosuvastatin  10 mg Oral Daily   sodium chloride flush  3 mL Intravenous Q12H   sucralfate  1 g Oral TID WC & HS   tamsulosin  0.4 mg Oral QPC supper   Continuous Infusions:  promethazine (PHENERGAN) injection (IM or IVPB) Stopped (10/25/23 2227)   PRN Meds:.acetaminophen **OR** acetaminophen, hydrALAZINE,  HYDROcodone-acetaminophen, labetalol, morphine injection, ondansetron (ZOFRAN) IV, polyethylene glycol, promethazine (PHENERGAN) injection (IM or IVPB)  Antimicrobials: Anti-infectives (From admission, onward)    None        I have personally reviewed the following labs and images: CBC: Recent Labs  Lab 10/21/23 0959 10/25/23 0703 10/26/23 0715  WBC 12.3* 9.1 10.5  NEUTROABS 7.3 6.3  --   HGB 13.7 14.4 13.2  HCT 41.2 43.2 40.9  MCV 87.4 85.9 88.5  PLT 356.0 355 334   BMP &GFR Recent Labs  Lab 10/21/23 0959 10/25/23 0703 10/26/23 0715  NA 131* 129* 127*  K 4.3 4.0 3.5  CL 94* 88* 94*  CO2 25 27 23   GLUCOSE 466* 483* 346*  BUN 24* 28* 14  CREATININE 0.81 0.95 0.56  CALCIUM 9.6 9.4 8.2*   Estimated Creatinine Clearance: 56.4 mL/min (by C-G formula based on SCr of 0.56 mg/dL). Liver & Pancreas: Recent Labs  Lab 10/25/23 0703  AST 20  ALT 13  ALKPHOS 75  BILITOT 0.7  PROT 8.3*  ALBUMIN 4.1   Recent Labs  Lab 10/21/23 0959 10/25/23  0703  LIPASE 27.0 36   No results for input(s): "AMMONIA" in the last 168 hours. Diabetic: No results for input(s): "HGBA1C" in the last 72 hours. Recent Labs  Lab 10/25/23 0857 10/25/23 1114 10/25/23 1428 10/25/23 2127 10/26/23 0759  GLUCAP 392* 228* 228* 279* 313*   Cardiac Enzymes: No results for input(s): "CKTOTAL", "CKMB", "CKMBINDEX", "TROPONINI" in the last 168 hours. No results for input(s): "PROBNP" in the last 8760 hours. Coagulation Profile: Recent Labs  Lab 10/26/23 0715  INR 1.0   Thyroid Function Tests: Recent Labs    10/26/23 0715  TSH 6.562*   Lipid Profile: No results for input(s): "CHOL", "HDL", "LDLCALC", "TRIG", "CHOLHDL", "LDLDIRECT" in the last 72 hours. Anemia Panel: No results for input(s): "VITAMINB12", "FOLATE", "FERRITIN", "TIBC", "IRON", "RETICCTPCT" in the last 72 hours. Urine analysis:    Component Value Date/Time   COLORURINE YELLOW 10/25/2023 0909   APPEARANCEUR CLEAR  10/25/2023 0909   LABSPEC 1.024 10/25/2023 0909   PHURINE 7.0 10/25/2023 0909   GLUCOSEU >=500 (A) 10/25/2023 0909   HGBUR NEGATIVE 10/25/2023 0909   BILIRUBINUR NEGATIVE 10/25/2023 0909   BILIRUBINUR negative 03/09/2021 1123   KETONESUR 5 (A) 10/25/2023 0909   PROTEINUR NEGATIVE 10/25/2023 0909   UROBILINOGEN 0.2 03/09/2021 1123   UROBILINOGEN 0.2 10/28/2013 1723   NITRITE NEGATIVE 10/25/2023 0909   LEUKOCYTESUR NEGATIVE 10/25/2023 0909   Sepsis Labs: Invalid input(s): "PROCALCITONIN", "LACTICIDVEN"  Microbiology: No results found for this or any previous visit (from the past 240 hour(s)).  Radiology Studies: CT ABDOMEN PELVIS W CONTRAST  Result Date: 10/25/2023 CLINICAL DATA:  Left lower quadrant pain and nausea beginning this morning. EXAM: CT ABDOMEN AND PELVIS WITH CONTRAST TECHNIQUE: Multidetector CT imaging of the abdomen and pelvis was performed using the standard protocol following bolus administration of intravenous contrast. RADIATION DOSE REDUCTION: This exam was performed according to the departmental dose-optimization program which includes automated exposure control, adjustment of the mA and/or kV according to patient size and/or use of iterative reconstruction technique. CONTRAST:  OMNIPAQUE IOHEXOL 300 MG/ML  SOLN COMPARISON:  CT on 09/22/2023 FINDINGS: Lower Chest: No acute findings. Hepatobiliary: No suspicious hepatic masses identified. A focal enhancing nodular density is seen in the gallbladder fundus measuring approximately 1 cm. This remains stable since recent study, and could be due to gallbladder polyp/neoplasm or focal adenomyosis. No No evidence of biliary obstruction. Pancreas:  No mass or inflammatory changes. Spleen: Within normal limits in size and appearance. Adrenals/Urinary Tract: No suspicious masses identified. No evidence of ureteral calculi or hydronephrosis. Stomach/Bowel: No evidence of obstruction, inflammatory process or abnormal fluid  collections. Normal appendix visualized. Diffuse colonic diverticulosis is again seen, without signs of diverticulitis. Vascular/Lymphatic: No pathologically enlarged lymph nodes. No acute vascular findings. Reproductive: Prior hysterectomy noted. Adnexal regions are unremarkable in appearance. Other:  Stable small umbilical hernia, which contains only fat. Musculoskeletal:  No suspicious bone lesions identified. IMPRESSION: No acute findings. Colonic diverticulosis, without radiographic evidence of diverticulitis. Stable 1 cm enhancing nodular density in gallbladder fundus, which could be due to gallbladder polyp/neoplasm or focal adenomyomatosis. Recommend abdomen ultrasound for further evaluation. Stable small umbilical hernia, which contains only fat. Electronically Signed   By: Danae Orleans M.D.   On: 10/25/2023 16:17      Iriana Artley T. Shahir Karen [facility]  If 7PM-7AM, please contact night-coverage [email or web address] 10/26/2023, 11:08 AM

## 2023-10-26 NOTE — Progress Notes (Signed)
Mobility Specialist - Progress Note   10/26/23 1310  Mobility  Activity Ambulated independently in hallway  Level of Assistance Independent  Assistive Device None  Distance Ambulated (ft) 500 ft  Activity Response Tolerated well  Mobility Referral Yes  $Mobility charge 1 Mobility  Mobility Specialist Start Time (ACUTE ONLY) 1226  Mobility Specialist Stop Time (ACUTE ONLY) 1238  Mobility Specialist Time Calculation (min) (ACUTE ONLY) 12 min   Pt received in bed and agreeable to mobility. No complaints during session. Pt to bed after session with all needs met.    [facility]

## 2023-10-27 DIAGNOSIS — K824 Cholesterolosis of gallbladder: Secondary | ICD-10-CM

## 2023-10-27 DIAGNOSIS — R1084 Generalized abdominal pain: Secondary | ICD-10-CM

## 2023-10-27 DIAGNOSIS — I10 Essential (primary) hypertension: Secondary | ICD-10-CM

## 2023-10-27 DIAGNOSIS — K3184 Gastroparesis: Secondary | ICD-10-CM

## 2023-10-27 DIAGNOSIS — K2970 Gastritis, unspecified, without bleeding: Secondary | ICD-10-CM

## 2023-10-27 DIAGNOSIS — Z91199 Patient's noncompliance with other medical treatment and regimen due to unspecified reason: Secondary | ICD-10-CM

## 2023-10-27 DIAGNOSIS — F331 Major depressive disorder, recurrent, moderate: Secondary | ICD-10-CM

## 2023-10-27 DIAGNOSIS — E039 Hypothyroidism, unspecified: Secondary | ICD-10-CM

## 2023-10-27 DIAGNOSIS — K2990 Gastroduodenitis, unspecified, without bleeding: Secondary | ICD-10-CM

## 2023-10-27 DIAGNOSIS — F1210 Cannabis abuse, uncomplicated: Secondary | ICD-10-CM

## 2023-10-27 DIAGNOSIS — K279 Peptic ulcer, site unspecified, unspecified as acute or chronic, without hemorrhage or perforation: Secondary | ICD-10-CM

## 2023-10-27 DIAGNOSIS — I480 Paroxysmal atrial fibrillation: Secondary | ICD-10-CM

## 2023-10-27 DIAGNOSIS — E1065 Type 1 diabetes mellitus with hyperglycemia: Secondary | ICD-10-CM

## 2023-10-27 LAB — GLUCOSE, CAPILLARY
Glucose-Capillary: 191 mg/dL — ABNORMAL HIGH (ref 70–99)
Glucose-Capillary: 93 mg/dL (ref 70–99)
Glucose-Capillary: 179 mg/dL — ABNORMAL HIGH (ref 70–99)
Glucose-Capillary: 70 mg/dL (ref 70–99)
Glucose-Capillary: 141 mg/dL — ABNORMAL HIGH (ref 70–99)

## 2023-10-27 LAB — COMPREHENSIVE METABOLIC PANEL
ALT: 12 U/L (ref 0–44)
AST: 16 U/L (ref 15–41)
Albumin: 3.2 g/dL — ABNORMAL LOW (ref 3.5–5.0)
Alkaline Phosphatase: 55 U/L (ref 38–126)
Anion gap: 7 (ref 5–15)
BUN: 8 mg/dL (ref 6–20)
CO2: 24 mmol/L (ref 22–32)
Calcium: 8.2 mg/dL — ABNORMAL LOW (ref 8.9–10.3)
Chloride: 100 mmol/L (ref 98–111)
Creatinine, Ser: 0.6 mg/dL (ref 0.44–1.00)
GFR, Estimated: >60 mL/min (ref >60)
Glucose, Bld: 166 mg/dL — ABNORMAL HIGH (ref 70–99)
Potassium: 4.3 mmol/L (ref 3.5–5.1)
Sodium: 131 mmol/L — ABNORMAL LOW (ref 135–145)
Total Bilirubin: 0.7 mg/dL (ref <1.2)
Total Protein: 6.4 g/dL — ABNORMAL LOW (ref 6.5–8.1)

## 2023-10-27 LAB — CBC
HCT: 39.5 % (ref 36.0–46.0)
Hemoglobin: 12.9 g/dL (ref 12.0–15.0)
MCH: 28.6 pg (ref 26.0–34.0)
MCHC: 32.7 g/dL (ref 30.0–36.0)
MCV: 87.6 fL (ref 80.0–100.0)
Platelets: 333 10*3/uL (ref 150–400)
RBC: 4.51 MIL/uL (ref 3.87–5.11)
RDW: 14.2 % (ref 11.5–15.5)
WBC: 8.5 10*3/uL (ref 4.0–10.5)
nRBC: 0 % (ref 0.0–0.2)

## 2023-10-27 LAB — MAGNESIUM: Magnesium: 2.4 mg/dL (ref 1.7–2.4)

## 2023-10-27 MED ORDER — INSULIN GLARGINE-YFGN 100 UNIT/ML ~~LOC~~ SOLN
20.0000 [IU] | Freq: Every day | SUBCUTANEOUS | Status: DC
  Administered 2023-10-27: 20 [IU] via SUBCUTANEOUS
  Filled 2023-10-27 (×2): qty 0.2

## 2023-10-27 NOTE — Progress Notes (Addendum)
    Patient Name: CANTRICE STULTS           DOB: 1961-05-25  MRN: [id number]      Admission Date: 10/25/2023  Attending Provider: Almon Hercules, MD  Primary Diagnosis: Abdominal pain   Level of care: Med-Surg    CROSS COVER NOTE   Date of Service   10/27/2023   Gilberto Better, 62 y.o. female, was admitted on 10/25/2023 for Abdominal pain.    HPI/Events of Note   Borderline BP, as low as 86/55 Chronic hypotension-midodrine at home Asymptomatic.  All other vital stable.  No acute changes reported.   On admission she had intermittent nausea and vomiting.  Currently tolerating clear liquid diet.  Midodrine was not resumed during hospitalization because of slightly elevated BP.   Nursing staff made aware to hold opioids, sedating agents, antihypertensives for tonight.   Interventions/ Plan   Will provide 5 mg oral midodrine and 500 cc fluid bolus. Monitor BP, goal MAP > 65 with SBP> 90        Anthoney Harada, DNP, [employer] [facility] [employer]

## 2023-10-27 NOTE — Plan of Care (Signed)

## 2023-10-27 NOTE — Progress Notes (Signed)
PROGRESS NOTE  [name] [name] QIO:[id number] DOB: 1961/10/21   PCP: Everardo All, NP  Patient is from: Home  DOA: 10/25/2023 LOS: 1  Chief complaints Chief Complaint  Patient presents with   Hyperglycemia   Abdominal Pain     Brief Narrative / Interim history: 62 year old F with poorly controlled T1DM (HbA1c >15.5%), gastroparesis, pancreatitis, diastolic CHF, paroxysmal A-fib not on AC, HTN, hypothyroidism, depression, gastritis/duodenitis/duodenal ulcer and marijuana use presented to ED with progressive abdominal pain.  Patient had EGD/EUS about 2 weeks ago that showed severe LA grade D erosive esophagitis, gastritis and duodenal ulcer.  She was started on PPI and Carafate.  Reports worsening generalized abdominal pain since EGD.  She also had associated nausea and vomiting intermittently.  CT abdomen and pelvis without significant finding other than known 1 cm gallbladder polyp or nodule.  CMP with pseudohyponatremia in the setting of hyperglycemia to 483.  LFT and lipase within normal.  CBC and VBG without significant finding.  UA with some glucosuria and mild ketonuria.  UDS positive for THC.  Patient was started on IVF, antiemetics and insulin and admitted for further care.  Abdominal pain improving.  Subjective: Seen and examined earlier this morning.  No major events overnight of this morning.  Reports improvement in abdominal pain.  Likes to try soft diet.  Denies nausea or vomiting.  Objective: Vitals:   10/26/23 2116 10/26/23 2354 10/27/23 0433 10/27/23 1243  BP: (!) 89/77 96/66 108/67 104/66  Pulse: 79 77 81 (!) 105  Resp:   18 18  Temp:   97.9 F (36.6 C) 99.3 F (37.4 C)  TempSrc:   Oral Oral  SpO2:   100% 91%  Weight:      Height:        Examination:  GENERAL: No apparent distress.  Nontoxic. HEENT: MMM.  Vision and hearing grossly intact.  NECK: Supple.  No apparent JVD.  RESP:  No IWOB.  Fair aeration bilaterally. CVS:  RRR. Heart sounds normal.   ABD/GI/GU: BS+. Abd soft.  Mild diffuse tenderness. MSK/EXT:  Moves extremities. No apparent deformity. No edema.  SKIN: no apparent skin lesion or wound NEURO: Awake, alert and oriented appropriately.  No apparent focal neuro deficit. PSYCH: Calm. Normal affect.   Procedures:  None  Microbiology summarized: None  Assessment and plan: Generalized abdominal pain with associated nausea and vomiting: I suspect gastroparesis in the setting of poorly controlled diabetes.  His A1c was > 15.5% about 2 months ago.  She also have history of chronic pancreatitis, severe esophagitis, gastritis and duodenal ulcer.  LFT and lipase is within normal.  CT abdomen and pelvis without acute finding.  She might have some functional issues.  It seems she was on Bentyl and Levsin at home.  Symptoms improved.  Tolerated clear liquid diet. -Advance diet -Continue scheduled IV Reglan ACHS -Encouraged minimizing opiate use -Continue PPI and Carafate -Optimize glycemic control -Encouraged ambulation.  Poorly controlled T1DM with hyperglycemia and gastroparesis: A1c > 15.5% in 08/2023. Recent Labs  Lab 10/26/23 1546 10/26/23 1608 10/26/23 2148 10/27/23 0729 10/27/23 1146  GLUCAP 44* 147* 209* 191* 93  -Continue SSI-moderate -Semglee 20 units daily -Continue scheduled Reglan as above -Further adjustment as appropriate  Chronic hypotension? BP slightly elevated on arrival.  Now normotensive.  Seems she is on midodrine at home. -Continue holding midodrine.    Acute urinary retention: Bladder scan showed 675 cc urine and Foley catheter was inserted. -Voiding trial today -Bladder scan and strict intake and  output  Anxiety and depression: Stable -Continue home meds  Gall bladder polyp: Doubt this is the cause of her pain.  Not a new finding.  LFT and lipase within normal. -Outpatient follow-up with general surgery  Paroxysmal A-fib?  Currently in sinus rhythm.  Not on rate or rhythm control or  anticoagulation.  Marijuana use: -Encouraged cessation.  Hypothyroidism -Continue home Synthroid  Hyponatremia: Improved. -Continue monitoring   Body mass index is 24.4 kg/m.          DVT prophylaxis:  enoxaparin (LOVENOX) injection 40 mg Start: 10/26/23 0800 SCDs Start: 10/25/23 1818  Code Status: Full code Family Communication: None at bedside Level of care: Med-Surg Status is: Inpatient The patient will remain inpatient because: Generalized abdominal pain with associated nausea and vomiting   Final disposition: Likely home on 11/18 Consultants:  None  55 minutes with more than 50% spent in reviewing records, counseling patient/family and coordinating care.   Sch Meds:  Scheduled Meds:  aspirin EC  81 mg Oral Daily   Chlorhexidine Gluconate Cloth  6 each Topical Daily   enoxaparin (LOVENOX) injection  40 mg Subcutaneous Q24H   escitalopram  10 mg Oral Daily   insulin aspart  0-15 Units Subcutaneous TID WC   insulin aspart  0-5 Units Subcutaneous QHS   insulin glargine-yfgn  20 Units Subcutaneous Daily   levothyroxine  137 mcg Oral QAC breakfast   metoCLOPramide  5 mg Oral TID AC & HS   pantoprazole  40 mg Oral BID   QUEtiapine  50 mg Oral QHS   ranolazine  500 mg Oral BID   rosuvastatin  10 mg Oral Daily   sodium chloride flush  3 mL Intravenous Q12H   sucralfate  1 g Oral TID WC & HS   tamsulosin  0.4 mg Oral QPC supper   Continuous Infusions:  promethazine (PHENERGAN) injection (IM or IVPB) Stopped (10/25/23 2227)   PRN Meds:.acetaminophen **OR** acetaminophen, hydrALAZINE, HYDROcodone-acetaminophen, labetalol, ondansetron (ZOFRAN) IV, polyethylene glycol, promethazine (PHENERGAN) injection (IM or IVPB)  Antimicrobials: Anti-infectives (From admission, onward)    None        I have personally reviewed the following labs and images: CBC: Recent Labs  Lab 10/21/23 0959 10/25/23 0703 10/26/23 0715 10/27/23 0554  WBC 12.3* 9.1 10.5 8.5   NEUTROABS 7.3 6.3  --   --   HGB 13.7 14.4 13.2 12.9  HCT 41.2 43.2 40.9 39.5  MCV 87.4 85.9 88.5 87.6  PLT 356.0 355 334 333   BMP &GFR Recent Labs  Lab 10/21/23 0959 10/25/23 0703 10/26/23 0715 10/27/23 0554  NA 131* 129* 127* 131*  K 4.3 4.0 3.5 4.3  CL 94* 88* 94* 100  CO2 25 27 23 24   GLUCOSE 466* 483* 346* 166*  BUN 24* 28* 14 8  CREATININE 0.81 0.95 0.56 0.60  CALCIUM 9.6 9.4 8.2* 8.2*  MG  --   --   --  2.4   Estimated Creatinine Clearance: 56.4 mL/min (by C-G formula based on SCr of 0.6 mg/dL). Liver & Pancreas: Recent Labs  Lab 10/25/23 0703 10/27/23 0554  AST 20 16  ALT 13 12  ALKPHOS 75 55  BILITOT 0.7 0.7  PROT 8.3* 6.4*  ALBUMIN 4.1 3.2*   Recent Labs  Lab 10/21/23 0959 10/25/23 0703  LIPASE 27.0 36   No results for input(s): "AMMONIA" in the last 168 hours. Diabetic: No results for input(s): "HGBA1C" in the last 72 hours. Recent Labs  Lab 10/26/23 1546 10/26/23 1608 10/26/23  2148 10/27/23 0729 10/27/23 1146  GLUCAP 44* 147* 209* 191* 93   Cardiac Enzymes: No results for input(s): "CKTOTAL", "CKMB", "CKMBINDEX", "TROPONINI" in the last 168 hours. No results for input(s): "PROBNP" in the last 8760 hours. Coagulation Profile: Recent Labs  Lab 10/26/23 0715  INR 1.0   Thyroid Function Tests: Recent Labs    10/26/23 0715  TSH 6.562*   Lipid Profile: No results for input(s): "CHOL", "HDL", "LDLCALC", "TRIG", "CHOLHDL", "LDLDIRECT" in the last 72 hours. Anemia Panel: No results for input(s): "VITAMINB12", "FOLATE", "FERRITIN", "TIBC", "IRON", "RETICCTPCT" in the last 72 hours. Urine analysis:    Component Value Date/Time   COLORURINE YELLOW 10/25/2023 0909   APPEARANCEUR CLEAR 10/25/2023 0909   LABSPEC 1.024 10/25/2023 0909   PHURINE 7.0 10/25/2023 0909   GLUCOSEU >=500 (A) 10/25/2023 0909   HGBUR NEGATIVE 10/25/2023 0909   BILIRUBINUR NEGATIVE 10/25/2023 0909   BILIRUBINUR negative 03/09/2021 1123   KETONESUR 5 (A)  10/25/2023 0909   PROTEINUR NEGATIVE 10/25/2023 0909   UROBILINOGEN 0.2 03/09/2021 1123   UROBILINOGEN 0.2 10/28/2013 1723   NITRITE NEGATIVE 10/25/2023 0909   LEUKOCYTESUR NEGATIVE 10/25/2023 0909   Sepsis Labs: Invalid input(s): "PROCALCITONIN", "LACTICIDVEN"  Microbiology: No results found for this or any previous visit (from the past 240 hour(s)).  Radiology Studies: No results found.    Kea Callan T. Mayrani Khamis [facility]  If 7PM-7AM, please contact night-coverage [email or web address] 10/27/2023, 4:35 PM

## 2023-10-27 NOTE — Plan of Care (Signed)
  Problem: Fluid Volume: Goal: Ability to maintain a balanced intake and output will improve Outcome: Progressing   Problem: Education: Goal: Ability to describe self-care measures that may prevent or decrease complications (Diabetes Survival Skills Education) will improve Outcome: Progressing Goal: Individualized Educational Video(s) Outcome: Progressing   Problem: Elimination: Goal: Will not experience complications related to bowel motility Outcome: Progressing Goal: Will not experience complications related to urinary retention Outcome: Progressing - Foley discontinued at 1030. Pt has voided 900 ml since removal.    Problem: Pain Management: Goal: General experience of comfort will improve Outcome: Progressing

## 2023-10-27 NOTE — Progress Notes (Signed)
Mobility Specialist - Progress Note   10/27/23 1422  Mobility  Activity Ambulated independently in hallway  Level of Assistance Independent  Assistive Device None  Distance Ambulated (ft) 440 ft  Activity Response Tolerated well  Mobility Referral Yes  $Mobility charge 1 Mobility  Mobility Specialist Start Time (ACUTE ONLY) 0148  Mobility Specialist Stop Time (ACUTE ONLY) 0200  Mobility Specialist Time Calculation (min) (ACUTE ONLY) 12 min   Pt received in bed and agreeable to mobility. No complaints during session. Pt to bed after session with all needs met.    [facility]

## 2023-10-28 ENCOUNTER — Telehealth: Payer: Self-pay

## 2023-10-28 ENCOUNTER — Other Ambulatory Visit: Payer: Self-pay

## 2023-10-28 DIAGNOSIS — I10 Essential (primary) hypertension: Secondary | ICD-10-CM | POA: Diagnosis not present

## 2023-10-28 DIAGNOSIS — K269 Duodenal ulcer, unspecified as acute or chronic, without hemorrhage or perforation: Secondary | ICD-10-CM

## 2023-10-28 DIAGNOSIS — I480 Paroxysmal atrial fibrillation: Secondary | ICD-10-CM | POA: Diagnosis not present

## 2023-10-28 DIAGNOSIS — K209 Esophagitis, unspecified without bleeding: Secondary | ICD-10-CM

## 2023-10-28 DIAGNOSIS — K3184 Gastroparesis: Secondary | ICD-10-CM | POA: Diagnosis not present

## 2023-10-28 DIAGNOSIS — R1084 Generalized abdominal pain: Secondary | ICD-10-CM | POA: Diagnosis not present

## 2023-10-28 DIAGNOSIS — R112 Nausea with vomiting, unspecified: Secondary | ICD-10-CM

## 2023-10-28 DIAGNOSIS — R109 Unspecified abdominal pain: Secondary | ICD-10-CM

## 2023-10-28 LAB — MAGNESIUM: Magnesium: 2.3 mg/dL (ref 1.7–2.4)

## 2023-10-28 LAB — RENAL FUNCTION PANEL
Albumin: 3.1 g/dL — ABNORMAL LOW (ref 3.5–5.0)
Anion gap: 9 (ref 5–15)
BUN: 18 mg/dL (ref 6–20)
CO2: 22 mmol/L (ref 22–32)
Calcium: 8.4 mg/dL — ABNORMAL LOW (ref 8.9–10.3)
Chloride: 100 mmol/L (ref 98–111)
Creatinine, Ser: 0.97 mg/dL (ref 0.44–1.00)
GFR, Estimated: >60 mL/min (ref >60)
Glucose, Bld: 264 mg/dL — ABNORMAL HIGH (ref 70–99)
Phosphorus: 2.9 mg/dL (ref 2.5–4.6)
Potassium: 4.7 mmol/L (ref 3.5–5.1)
Sodium: 131 mmol/L — ABNORMAL LOW (ref 135–145)

## 2023-10-28 LAB — GLUCOSE, CAPILLARY: Glucose-Capillary: 262 mg/dL — ABNORMAL HIGH (ref 70–99)

## 2023-10-28 MED ORDER — TOUJEO SOLOSTAR 300 UNIT/ML ~~LOC~~ SOPN: 25.0000 [IU] | PEN_INJECTOR | Freq: Every day | SUBCUTANEOUS | 1 refills | Status: DC

## 2023-10-28 MED ORDER — PANTOPRAZOLE SODIUM 40 MG PO TBEC: 40.0000 mg | DELAYED_RELEASE_TABLET | Freq: Two times a day (BID) | ORAL | 0 refills | Status: DC

## 2023-10-28 MED ORDER — SUCRALFATE 1 GM/10ML PO SUSP: 1.0000 g | Freq: Two times a day (BID) | ORAL | 3 refills | Status: DC

## 2023-10-28 MED ORDER — INSULIN ASPART 100 UNIT/ML IJ SOLN: 3.0000 [IU] | Freq: Three times a day (TID) | INTRAMUSCULAR | Status: DC

## 2023-10-28 NOTE — Discharge Summary (Signed)
Physician Discharge Summary  [name] [name] WUJ:[id number] DOB: 12-21-1960 DOA: 10/25/2023  PCP: Everardo All, NP  Admit date: 10/25/2023 Discharge date: 10/28/2023 Admitted From: Home Disposition: Home Recommendations for Outpatient Follow-up:  Follow up with PCP in 1 week Assess glycemic control, CMP and CBC at follow-up Please follow up on the following pending results: None  Home Health: Not indicated Equipment/Devices: Not indicated  Discharge Condition: Stable CODE STATUS: Full code  Follow-up Information     Everardo All, NP. Schedule an appointment as soon as possible for a visit in 1 week(s).   Specialty: [facility] course 62 year old F with poorly controlled T1DM (HbA1c >15.5%), gastroparesis, pancreatitis, diastolic CHF, paroxysmal A-fib not on AC, HTN, hypothyroidism, depression, gastritis/duodenitis/duodenal ulcer and marijuana use presented to ED with progressive abdominal pain.  Patient had EGD/EUS about 2 weeks ago that showed severe LA grade D erosive esophagitis, gastritis and duodenal ulcer.  She was started on PPI and Carafate.  Reports worsening generalized abdominal pain since EGD.  She also had associated nausea and vomiting intermittently.  CT abdomen and pelvis without significant finding other than known 1 cm gallbladder polyp or nodule.  CMP with pseudohyponatremia in the setting of hyperglycemia to 483.  LFT and lipase within normal.  CBC and VBG without significant finding.  UA with some glucosuria and mild ketonuria.  UDS positive for THC.  Patient was started on IVF, antiemetics and insulin and admitted for further care.   Patient's symptoms resolved with scheduled Reglan and bowel rest.  Eventually, she tolerated soft diet without problem.  She is discharged on p.o. Reglan ACHS.  Discontinued Bentyl and Levsin.  Advised to minimize to avoid opiates.  Counseled on the importance of good glycemic control.  Basal  insulin increased from 12 to 25 units.  She is also on mealtime coverage.  Also counseled on small frequent diets with low fiber residue use for gastroparesis.  Outpatient follow-up with PCP in 1 to 2 weeks.  She is already on PPI and Carafate for esophagitis, gastritis and duodenal ulcer.  See individual problem list below for more.   Problems addressed during this hospitalization Generalized abdominal pain with associated nausea and vomiting: I suspect gastroparesis in the setting of poorly controlled diabetes.  His A1c was > 15.5% about 2 months ago.  She also have history of chronic pancreatitis, severe esophagitis, gastritis and duodenal ulcer.  LFT and lipase is within normal.  CT abdomen and pelvis without acute finding.  She might have some functional issues.  It seems she was on Bentyl and Levsin at home.  Symptoms resolved.  Tolerated soft diet.   -Patient to continue Reglan ACHS. -She will continue home PPI and Carafate as well. -Discussed the importance of good glycemic control -Also counseled on management of gastroparesis -Discontinued Bentyl and Levsin   Poorly controlled T1DM with hyperglycemia and gastroparesis: A1c > 15.5% in 08/2023. -Increased home Toujeo from 12 to 25 units daily -Patient to continue home NovoLog -Reglan ACHS   Chronic hypotension? -Continue home meds   Acute urinary retention: Bladder scan showed 675 cc urine and Foley catheter was inserted.  Passed voiding trial on 11/17   Anxiety and depression: Stable -Continue home meds   Gall bladder polyp: Doubt this is the cause of her pain.  Not a new finding.  LFT and lipase within normal. -Outpatient follow-up with general surgery as previously planned  Paroxysmal A-fib?  Currently in sinus rhythm.  Not on rate or rhythm control or anticoagulation.   Marijuana use: -Encouraged cessation.   Hypothyroidism -Continue home Synthroid   Hyponatremia: Improved. -Recheck CMP at follow-up               Time spent 35 minutes  Vital signs Vitals:   10/27/23 1243 10/27/23 2013 10/27/23 2359 10/28/23 0457  BP: 104/66 105/75 111/73 (!) 90/57  Pulse: (!) 105 88 86 85  Temp: 99.3 F (37.4 C) 98.1 F (36.7 C)  98.2 F (36.8 C)  Resp: 18 16  16   Height:      Weight:      SpO2: 91% 100% 96% 99%  TempSrc: Oral     BMI (Calculated):         Discharge exam  GENERAL: No apparent distress.  Nontoxic. HEENT: MMM.  Vision and hearing grossly intact.  NECK: Supple.  No apparent JVD.  RESP:  No IWOB.  Fair aeration bilaterally. CVS:  RRR. Heart sounds normal.  ABD/GI/GU: BS+. Abd soft, NTND.  MSK/EXT:  Moves extremities. No apparent deformity. No edema.  SKIN: no apparent skin lesion or wound NEURO: Awake and alert. Oriented appropriately.  No apparent focal neuro deficit. PSYCH: Calm. Normal affect.   Discharge Instructions Discharge Instructions     Diet - low sodium heart healthy   Complete by: As directed    Diet Carb Modified   Complete by: As directed    Discharge instructions   Complete by: As directed    It has been a pleasure taking care of you!  You were hospitalized due to abdominal pain, nausea, vomiting and high blood glucose.  We believe your symptoms are related to poorly controlled diabetes and gastroparesis.  Your symptoms improved with treatment.  Continue using your Reglan for nausea, vomiting and pain.  We also recommend frequent small low fiber residue diet for gastroparesis.  It is also very important that you have your diabetes under good control.  Please review your new medication list and the directions on your medications before you take them.  Follow-up with your primary care doctor in 1 to 2 weeks or sooner if needed.  Follow-up with your gastroenterologist as previously planned.   Take care,   Increase activity slowly   Complete by: As directed       Allergies as of 10/28/2023       Reactions   Codeine Hives, Anxiety, Other (See Comments)         Medication List     STOP taking these medications    dicyclomine 20 MG tablet Commonly known as: BENTYL   HYDROcodone-acetaminophen 5-325 MG tablet Commonly known as: NORCO/VICODIN   hyoscyamine 0.125 MG SL tablet Commonly known as: Levsin/SL   ondansetron 4 MG disintegrating tablet Commonly known as: ZOFRAN-ODT   ondansetron 4 MG tablet Commonly known as: ZOFRAN   promethazine 25 MG suppository Commonly known as: PHENERGAN   promethazine 25 MG tablet Commonly known as: PHENERGAN   sucralfate 1 g tablet Commonly known as: Carafate       TAKE these medications    aspirin EC 81 MG tablet Take 81 mg by mouth daily. Swallow whole.   Dexcom G6 Sensor Misc INJECT 1 SENSOR INTO SKIN EVERY 10 DAYS What changed: See the new instructions.   Dexcom G6 Transmitter Misc USE AS DIRECTED FOR CONTINUOUS GLUCOSE MONITORING. REUSE TRANSMITTER X 90DAYS THEN DISCARD & REPLACE   escitalopram 10 MG tablet Commonly known as:  LEXAPRO Take 10 mg by mouth daily.   Glucagon Emergency 1 MG Kit Inject 1 mg into the skin See admin instructions. Inject one mg into the skin see administration instructions. Follow package directions for low blood sugar.   levothyroxine 137 MCG tablet Commonly known as: SYNTHROID Take 137 mcg by mouth daily before breakfast.   metoCLOPramide 5 MG tablet Commonly known as: Reglan Take 1 tablet (5 mg total) by mouth 2 (two) times daily. Take 30 minutes before breakfast and dinner.   midodrine 5 MG tablet Commonly known as: PROAMATINE Take 1 tablet (5 mg total) by mouth 2 (two) times daily with a meal. Hold if your systolic blood pressure  ( top number) is > , please check your blood pressure at home once a day and bring in record for your doctor to review, follow up with your pcp and cardiology regarding your blood pressure medication What changed:  when to take this additional instructions   NovoLOG FlexPen 100 UNIT/ML FlexPen Generic  drug: insulin aspart Inject 4 Units into the skin 3 (three) times daily with meals.   omega-3 acid ethyl esters 1 g capsule Commonly known as: LOVAZA Take 2 capsules (2 g total) by mouth 2 (two) times daily.   pantoprazole 40 MG tablet Commonly known as: Protonix Take 1 tablet (40 mg total) by mouth 2 (two) times daily.   QUEtiapine 50 MG tablet Commonly known as: SEROQUEL Take 50 mg by mouth at bedtime.   ranolazine 500 MG 12 hr tablet Commonly known as: RANEXA Take 500 mg by mouth 2 (two) times daily.   rosuvastatin 10 MG tablet Commonly known as: CRESTOR Take 10 mg by mouth daily.   tamsulosin 0.4 MG Caps capsule Commonly known as: FLOMAX Take 1 capsule (0.4 mg total) by mouth daily after supper.   Toujeo SoloStar 300 UNIT/ML Solostar Pen Generic drug: insulin glargine (1 Unit Dial) Inject 25 Units into the skin at bedtime. What changed: how much to take   traZODone 50 MG tablet Commonly known as: DESYREL Take 50 mg by mouth at bedtime.        Consultations: None  Procedures/Studies:    CT ABDOMEN PELVIS W CONTRAST  Result Date: 10/25/2023 CLINICAL DATA:  Left lower quadrant pain and nausea beginning this morning. EXAM: CT ABDOMEN AND PELVIS WITH CONTRAST TECHNIQUE: Multidetector CT imaging of the abdomen and pelvis was performed using the standard protocol following bolus administration of intravenous contrast. RADIATION DOSE REDUCTION: This exam was performed according to the departmental dose-optimization program which includes automated exposure control, adjustment of the mA and/or kV according to patient size and/or use of iterative reconstruction technique. CONTRAST:  OMNIPAQUE IOHEXOL 300 MG/ML  SOLN COMPARISON:  CT on 09/22/2023 FINDINGS: Lower Chest: No acute findings. Hepatobiliary: No suspicious hepatic masses identified. A focal enhancing nodular density is seen in the gallbladder fundus measuring approximately 1 cm. This remains stable since  recent study, and could be due to gallbladder polyp/neoplasm or focal adenomyosis. No No evidence of biliary obstruction. Pancreas:  No mass or inflammatory changes. Spleen: Within normal limits in size and appearance. Adrenals/Urinary Tract: No suspicious masses identified. No evidence of ureteral calculi or hydronephrosis. Stomach/Bowel: No evidence of obstruction, inflammatory process or abnormal fluid collections. Normal appendix visualized. Diffuse colonic diverticulosis is again seen, without signs of diverticulitis. Vascular/Lymphatic: No pathologically enlarged lymph nodes. No acute vascular findings. Reproductive: Prior hysterectomy noted. Adnexal regions are unremarkable in appearance. Other:  Stable small umbilical hernia, which contains only fat.  Musculoskeletal:  No suspicious bone lesions identified. IMPRESSION: No acute findings. Colonic diverticulosis, without radiographic evidence of diverticulitis. Stable 1 cm enhancing nodular density in gallbladder fundus, which could be due to gallbladder polyp/neoplasm or focal adenomyomatosis. Recommend abdomen ultrasound for further evaluation. Stable small umbilical hernia, which contains only fat. Electronically Signed   By: Danae Orleans M.D.   On: 10/25/2023 16:17       The results of significant diagnostics from this hospitalization (including imaging, microbiology, ancillary and laboratory) are listed below for reference.     Microbiology: No results found for this or any previous visit (from the past 240 hour(s)).   Labs:  CBC: Recent Labs  Lab 10/25/23 0703 10/26/23 0715 10/27/23 0554  WBC 9.1 10.5 8.5  NEUTROABS 6.3  --   --   HGB 14.4 13.2 12.9  HCT 43.2 40.9 39.5  MCV 85.9 88.5 87.6  PLT 355 334 333   BMP &GFR Recent Labs  Lab 10/25/23 0703 10/26/23 0715 10/27/23 0554 10/28/23 0537  NA 129* 127* 131* 131*  K 4.0 3.5 4.3 4.7  CL 88* 94* 100 100  CO2 27 23 24 22   GLUCOSE 483* 346* 166* 264*  BUN 28* 14 8 18    CREATININE 0.95 0.56 0.60 0.97  CALCIUM 9.4 8.2* 8.2* 8.4*  MG  --   --  2.4 2.3  PHOS  --   --   --  2.9   Estimated Creatinine Clearance: 46.5 mL/min (by C-G formula based on SCr of 0.97 mg/dL). Liver & Pancreas: Recent Labs  Lab 10/25/23 0703 10/27/23 0554 10/28/23 0537  AST 20 16  --   ALT 13 12  --   ALKPHOS 75 55  --   BILITOT 0.7 0.7  --   PROT 8.3* 6.4*  --   ALBUMIN 4.1 3.2* 3.1*   Recent Labs  Lab 10/25/23 0703  LIPASE 36   No results for input(s): "AMMONIA" in the last 168 hours. Diabetic: No results for input(s): "HGBA1C" in the last 72 hours. Recent Labs  Lab 10/27/23 1146 10/27/23 1624 10/27/23 2102 10/27/23 2139 10/28/23 0733  GLUCAP 93 179* 70 141* 262*   Cardiac Enzymes: No results for input(s): "CKTOTAL", "CKMB", "CKMBINDEX", "TROPONINI" in the last 168 hours. No results for input(s): "PROBNP" in the last 8760 hours. Coagulation Profile: Recent Labs  Lab 10/26/23 0715  INR 1.0   Thyroid Function Tests: Recent Labs    10/26/23 0715  TSH 6.562*   Lipid Profile: No results for input(s): "CHOL", "HDL", "LDLCALC", "TRIG", "CHOLHDL", "LDLDIRECT" in the last 72 hours. Anemia Panel: No results for input(s): "VITAMINB12", "FOLATE", "FERRITIN", "TIBC", "IRON", "RETICCTPCT" in the last 72 hours. Urine analysis:    Component Value Date/Time   COLORURINE YELLOW 10/25/2023 0909   APPEARANCEUR CLEAR 10/25/2023 0909   LABSPEC 1.024 10/25/2023 0909   PHURINE 7.0 10/25/2023 0909   GLUCOSEU >=500 (A) 10/25/2023 0909   HGBUR NEGATIVE 10/25/2023 0909   BILIRUBINUR NEGATIVE 10/25/2023 0909   BILIRUBINUR negative 03/09/2021 1123   KETONESUR 5 (A) 10/25/2023 0909   PROTEINUR NEGATIVE 10/25/2023 0909   UROBILINOGEN 0.2 03/09/2021 1123   UROBILINOGEN 0.2 10/28/2013 1723   NITRITE NEGATIVE 10/25/2023 0909   LEUKOCYTESUR NEGATIVE 10/25/2023 0909   Sepsis Labs: Invalid input(s): "PROCALCITONIN", "LACTICIDVEN"   SIGNED:  Almon Hercules, MD  [facility] 10/28/2023, 10:30 PM

## 2023-10-28 NOTE — Progress Notes (Signed)
   10/28/23 0912  TOC Brief Assessment  Insurance and Status Reviewed  Patient has primary care physician Yes  Home environment has been reviewed Single family home  Prior level of function: Independent  Prior/Current Home Services No current home services  Social Determinants of Health Reivew SDOH reviewed no interventions necessary  Readmission risk has been reviewed Yes  Transition of care needs no transition of care needs at this time

## 2023-10-28 NOTE — Telephone Encounter (Signed)
Message Received: 5 days ago Arnaldo Natal, NP  Emeline Darling, RN      Previous Messages  Routed Note  Author: Arnaldo Natal, NP Service: Gastroenterology Author Type: Nurse Practitioner  Filed: 10/23/2023  6:30 AM Encounter Date: 10/21/2023 Status: Signed  Editor: Arnaldo Natal, NP (Nurse Practitioner)  [name] [name], refer to [name] addendum below. Pls send in a new prescription for [name] 1gm po bid with 3 refills ot to take any other medication within 2 to 4 hours. Patient to continue Carafate until her next EGD. Continue Pantoprazole bid.  Schedule patient for an EGD with [name] around Dec 25, 2023 ( 3 months from her Oct EGD/EUS date).  Pls send patient to out lab within the next week for a fasting am cortisol level.  THX         Office Visit for Ulcerative Colitis 10/21/2023 Arnaldo Natal, NP - [facility] Gastroenterology Diagnoses  Nausea And Vomiting, Unspecified Vomiting Type (Primary) Esophagitis Duodenal ulcer without hemorrhage or perforation Type 1 diabetes mellitus without complication ([name]) Orders Signed This Visit  (5) Orders Pended This Visit  None Progress Notes  Arnaldo Natal, NP at 10/21/2023  9:00 AM  Status: Signed  [name] [name], refer to [name] addendum below. Pls send in a new prescription for [name] 1gm po bid with 3 refills ot to take any other medication within 2 to 4 hours. Patient to continue Carafate until her next EGD. Continue Pantoprazole bid.  Schedule patient for an EGD with [name] around Dec 25, 2023 ( 3 months from her Oct EGD/EUS date).  Pls send patient to out lab within the next week for a fasting am cortisol level.  THX        Mansouraty, Netty Starring., MD at 10/21/2023  9:00 AM  Status: Signed   Attending Physician's Attestation    I have reviewed the chart.    I agree with the Advanced  Practitioner's note, impression, and recommendations with any updates as below. As patient's prior gastroenterologist has retired and and most recent gastroenterologist will be retiring at the end of this year I am comfortable with moving forward as her GI provider.  However her history is quite complex and she has had much more cross-sectional imaging than most individuals should have in their lifetime.  I think she needs to just go ahead and continue Carafate therapy twice daily until her next endoscopy.  I would schedule it in approximately 6-month mark from her previous endoscopy to ensure healing of her esophagitis and her ulcers.  With upper EUS having been done before the MRI/MRCP, we knew that there was no evidence of any bile duct stones at that time and no evidence of pancreatic divisum.  She has not shown a manifested repeat pancreatitis labs so no indication for an ERCP.  I agree that getting her blood sugar control is key to trying to help her.  If she has not had adrenal insufficiency evaluation with an early a.m. cortisol having been drawn, I would recommend that as well.     [name] Parish, MD [facility] Office # [phone]       Arnaldo Natal, NP at 10/21/2023  9:00 AM

## 2023-10-28 NOTE — Plan of Care (Signed)
  Problem: Coping: Goal: Ability to adjust to condition or change in health will improve Outcome: Progressing   Problem: Fluid Volume: Goal: Ability to maintain a balanced intake and output will improve Outcome: Progressing   Problem: Activity: Goal: Risk for activity intolerance will decrease Outcome: Progressing   Problem: Nutrition: Goal: Adequate nutrition will be maintained Outcome: Progressing

## 2023-10-28 NOTE — Telephone Encounter (Signed)
Pt made aware of [name] and Alcide Evener NP recommendations:  Prescription sent in to pharmacy. Pt made aware. Pt notified to continue Carafate until her next EGD. Continue Pantoprazole bid.  Pt was scheduled for an EGD with [name] on 12/25/2023 at 10:00 AM. Pt made aware. Prep instructions were sent to pt via my chart. Pt made aware.  Ambulatory referral to GI placed in Epic.  Lab placed in Epic for cortisol level. Pt notified that it was a fating lab.  Pt verbalized understanding with all questions answered.

## 2023-10-28 NOTE — Progress Notes (Signed)
[name] [name], [facility] ok as verified by Dr. [name] as prior procedures were done during her hospital admission. Appreciate the thorough follow up.

## 2023-10-29 NOTE — Progress Notes (Signed)
[name] response as follows:  Mansouraty, Netty Starring., MD  Arnaldo Natal, NP CKS, [facility] is okay. Thanks. GM

## 2023-11-04 DIAGNOSIS — E785 Hyperlipidemia, unspecified: Secondary | ICD-10-CM | POA: Diagnosis not present

## 2023-11-04 DIAGNOSIS — E039 Hypothyroidism, unspecified: Secondary | ICD-10-CM | POA: Diagnosis not present

## 2023-11-04 DIAGNOSIS — I10 Essential (primary) hypertension: Secondary | ICD-10-CM | POA: Diagnosis not present

## 2023-11-04 DIAGNOSIS — J302 Other seasonal allergic rhinitis: Secondary | ICD-10-CM | POA: Diagnosis not present

## 2023-11-04 DIAGNOSIS — E1169 Type 2 diabetes mellitus with other specified complication: Secondary | ICD-10-CM | POA: Diagnosis not present

## 2023-11-04 DIAGNOSIS — R339 Retention of urine, unspecified: Secondary | ICD-10-CM | POA: Diagnosis not present

## 2023-11-04 DIAGNOSIS — E1165 Type 2 diabetes mellitus with hyperglycemia: Secondary | ICD-10-CM | POA: Diagnosis not present

## 2023-11-11 ENCOUNTER — Encounter (HOSPITAL_COMMUNITY): Payer: Self-pay

## 2023-11-11 ENCOUNTER — Other Ambulatory Visit: Payer: Self-pay

## 2023-11-11 ENCOUNTER — Emergency Department (HOSPITAL_COMMUNITY): Payer: BC Managed Care – PPO

## 2023-11-11 ENCOUNTER — Observation Stay (HOSPITAL_COMMUNITY)
Admission: EM | Admit: 2023-11-11 | Discharge: 2023-11-12 | Disposition: A | Discharge status: Home / Self Care | Payer: BC Managed Care – PPO | Attending: Family Medicine | Admitting: Family Medicine

## 2023-11-11 DIAGNOSIS — E1065 Type 1 diabetes mellitus with hyperglycemia: Secondary | ICD-10-CM | POA: Diagnosis not present

## 2023-11-11 DIAGNOSIS — E1010 Type 1 diabetes mellitus with ketoacidosis without coma: Secondary | ICD-10-CM | POA: Diagnosis not present

## 2023-11-11 DIAGNOSIS — R112 Nausea with vomiting, unspecified: Secondary | ICD-10-CM | POA: Diagnosis not present

## 2023-11-11 DIAGNOSIS — E039 Hypothyroidism, unspecified: Secondary | ICD-10-CM | POA: Diagnosis present

## 2023-11-11 DIAGNOSIS — I110 Hypertensive heart disease with heart failure: Secondary | ICD-10-CM | POA: Diagnosis not present

## 2023-11-11 DIAGNOSIS — I480 Paroxysmal atrial fibrillation: Secondary | ICD-10-CM | POA: Diagnosis not present

## 2023-11-11 DIAGNOSIS — Z87891 Personal history of nicotine dependence: Secondary | ICD-10-CM | POA: Insufficient documentation

## 2023-11-11 DIAGNOSIS — K2990 Gastroduodenitis, unspecified, without bleeding: Secondary | ICD-10-CM | POA: Diagnosis not present

## 2023-11-11 DIAGNOSIS — E860 Dehydration: Secondary | ICD-10-CM | POA: Diagnosis not present

## 2023-11-11 DIAGNOSIS — R109 Unspecified abdominal pain: Secondary | ICD-10-CM | POA: Diagnosis present

## 2023-11-11 DIAGNOSIS — E876 Hypokalemia: Secondary | ICD-10-CM | POA: Diagnosis present

## 2023-11-11 DIAGNOSIS — I5032 Chronic diastolic (congestive) heart failure: Secondary | ICD-10-CM | POA: Diagnosis not present

## 2023-11-11 DIAGNOSIS — F32A Depression, unspecified: Secondary | ICD-10-CM | POA: Diagnosis not present

## 2023-11-11 DIAGNOSIS — K3184 Gastroparesis: Principal | ICD-10-CM | POA: Diagnosis present

## 2023-11-11 DIAGNOSIS — K2980 Duodenitis without bleeding: Secondary | ICD-10-CM | POA: Insufficient documentation

## 2023-11-11 DIAGNOSIS — E1040 Type 1 diabetes mellitus with diabetic neuropathy, unspecified: Secondary | ICD-10-CM | POA: Diagnosis not present

## 2023-11-11 DIAGNOSIS — Z794 Long term (current) use of insulin: Secondary | ICD-10-CM | POA: Insufficient documentation

## 2023-11-11 DIAGNOSIS — R197 Diarrhea, unspecified: Secondary | ICD-10-CM

## 2023-11-11 DIAGNOSIS — I10 Essential (primary) hypertension: Secondary | ICD-10-CM | POA: Diagnosis present

## 2023-11-11 DIAGNOSIS — E785 Hyperlipidemia, unspecified: Secondary | ICD-10-CM | POA: Diagnosis present

## 2023-11-11 DIAGNOSIS — E109 Type 1 diabetes mellitus without complications: Secondary | ICD-10-CM | POA: Diagnosis present

## 2023-11-11 DIAGNOSIS — K760 Fatty (change of) liver, not elsewhere classified: Secondary | ICD-10-CM | POA: Diagnosis not present

## 2023-11-11 LAB — URINALYSIS, ROUTINE W REFLEX MICROSCOPIC
Bacteria, UA: NONE SEEN
Bilirubin Urine: NEGATIVE
Glucose, UA: >=500 mg/dL — AB
Hgb urine dipstick: NEGATIVE
Ketones, ur: 20 mg/dL — AB
Leukocytes,Ua: NEGATIVE
Nitrite: NEGATIVE
Protein, ur: NEGATIVE mg/dL
Specific Gravity, Urine: 1.035 — ABNORMAL HIGH (ref 1.005–1.030)
pH: 7 (ref 5.0–8.0)

## 2023-11-11 LAB — CBC WITH DIFFERENTIAL/PLATELET
Abs Immature Granulocytes: 0.12 10*3/uL — ABNORMAL HIGH (ref 0.00–0.07)
Basophils Absolute: 0 10*3/uL (ref 0.0–0.1)
Basophils Relative: 0 %
Eosinophils Absolute: 0 10*3/uL (ref 0.0–0.5)
Eosinophils Relative: 0 %
HCT: 42.8 % (ref 36.0–46.0)
Hemoglobin: 14.8 g/dL (ref 12.0–15.0)
Immature Granulocytes: 1 %
Lymphocytes Relative: 7 %
Lymphs Abs: 0.9 10*3/uL (ref 0.7–4.0)
MCH: 29.4 pg (ref 26.0–34.0)
MCHC: 34.6 g/dL (ref 30.0–36.0)
MCV: 85.1 fL (ref 80.0–100.0)
Monocytes Absolute: 0.4 10*3/uL (ref 0.1–1.0)
Monocytes Relative: 3 %
Neutro Abs: 11.3 10*3/uL — ABNORMAL HIGH (ref 1.7–7.7)
Neutrophils Relative %: 89 %
Platelets: 379 10*3/uL (ref 150–400)
RBC: 5.03 MIL/uL (ref 3.87–5.11)
RDW: 14.6 % (ref 11.5–15.5)
WBC: 12.8 10*3/uL — ABNORMAL HIGH (ref 4.0–10.5)
nRBC: 0 % (ref 0.0–0.2)

## 2023-11-11 LAB — COMPREHENSIVE METABOLIC PANEL
ALT: 17 U/L (ref 0–44)
AST: 15 U/L (ref 15–41)
Albumin: 4.6 g/dL (ref 3.5–5.0)
Alkaline Phosphatase: 80 U/L (ref 38–126)
Anion gap: 12 (ref 5–15)
BUN: 19 mg/dL (ref 6–20)
CO2: 28 mmol/L (ref 22–32)
Calcium: 9.2 mg/dL (ref 8.9–10.3)
Chloride: 90 mmol/L — ABNORMAL LOW (ref 98–111)
Creatinine, Ser: 0.79 mg/dL (ref 0.44–1.00)
GFR, Estimated: >60 mL/min (ref >60)
Glucose, Bld: 486 mg/dL — ABNORMAL HIGH (ref 70–99)
Potassium: 3.2 mmol/L — ABNORMAL LOW (ref 3.5–5.1)
Sodium: 130 mmol/L — ABNORMAL LOW (ref 135–145)
Total Bilirubin: 0.8 mg/dL (ref <1.2)
Total Protein: 8.2 g/dL — ABNORMAL HIGH (ref 6.5–8.1)

## 2023-11-11 LAB — BLOOD GAS, VENOUS
Acid-Base Excess: 7.6 mmol/L — ABNORMAL HIGH (ref 0.0–2.0)
Bicarbonate: 32 mmol/L — ABNORMAL HIGH (ref 20.0–28.0)
O2 Saturation: 95.5 %
Patient temperature: 37
pCO2, Ven: 43 mm[Hg] — ABNORMAL LOW (ref 44–60)
pH, Ven: 7.48 — ABNORMAL HIGH (ref 7.25–7.43)
pO2, Ven: 70 mm[Hg] — ABNORMAL HIGH (ref 32–45)

## 2023-11-11 LAB — CBG MONITORING, ED
Glucose-Capillary: 481 mg/dL — ABNORMAL HIGH (ref 70–99)
Glucose-Capillary: 254 mg/dL — ABNORMAL HIGH (ref 70–99)

## 2023-11-11 LAB — LIPASE, BLOOD: Lipase: 84 U/L — ABNORMAL HIGH (ref 11–51)

## 2023-11-11 MED ORDER — LACTATED RINGERS IV BOLUS
500.0000 mL | Freq: Once | INTRAVENOUS | Status: AC
  Administered 2023-11-11: 500 mL via INTRAVENOUS

## 2023-11-11 MED ORDER — INSULIN ASPART 100 UNIT/ML IJ SOLN
5.0000 [IU] | Freq: Once | INTRAMUSCULAR | Status: AC
  Administered 2023-11-11: 5 [IU] via SUBCUTANEOUS
  Filled 2023-11-11: qty 0.05

## 2023-11-11 MED ORDER — HYDROMORPHONE HCL 1 MG/ML IJ SOLN
1.0000 mg | Freq: Once | INTRAMUSCULAR | Status: AC
Start: 2023-11-11 — End: 2023-11-11
  Administered 2023-11-11: 1 mg via INTRAVENOUS
  Filled 2023-11-11: qty 1

## 2023-11-11 MED ORDER — INSULIN ASPART 100 UNIT/ML IJ SOLN
4.0000 [IU] | Freq: Once | INTRAMUSCULAR | Status: AC
  Administered 2023-11-11: 4 [IU] via SUBCUTANEOUS
  Filled 2023-11-11: qty 0.04

## 2023-11-11 MED ORDER — IOHEXOL 300 MG/ML  SOLN
100.0000 mL | Freq: Once | INTRAMUSCULAR | Status: AC | PRN
  Administered 2023-11-11: 80 mL via INTRAVENOUS

## 2023-11-11 MED ORDER — ONDANSETRON HCL 4 MG/2ML IJ SOLN
4.0000 mg | Freq: Once | INTRAMUSCULAR | Status: AC
Start: 2023-11-11 — End: 2023-11-11
  Administered 2023-11-11: 4 mg via INTRAVENOUS
  Filled 2023-11-11: qty 2

## 2023-11-11 NOTE — ED Notes (Signed)
Pt. Placed on 2 L O2 of oxygen saturation of 78% RA. Pt. Also has BP of 88/76. Provider aware.

## 2023-11-11 NOTE — ED Provider Notes (Signed)
[place] EMERGENCY DEPARTMENT AT [facility] Provider Note   CSN: [id number] Arrival date & time: 11/11/23  1501     History  Chief Complaint  Patient presents with   Abdominal Pain   Hyperglycemia    [name] [name] is a 62 y.o. female.  HPI   62 year old female with past medical history of gastric ulcer presents emergency department with abdominal pain, vomiting.  She also admits to being noncompliant with her diabetic medicine and complaining of hyperglycemia.  Patient states that she has had pain to her gastric ulcer before, she is take sucralfate.  However sometimes she needs IV pain medicine.  She denies any hematemesis or bloody diarrhea.  Denies any fever, chest pain.  Home Medications Prior to Admission medications   Medication Sig Start Date End Date Taking? Authorizing Provider  aspirin EC 81 MG tablet Take 81 mg by mouth daily. Swallow whole.    [provider]  Continuous Blood Gluc Sensor (DEXCOM G6 SENSOR) MISC INJECT 1 SENSOR INTO SKIN EVERY 10 DAYS Patient taking differently: Inject 1 Device into the skin See admin instructions. Place 1 new device into the skin every 10 days 07/18/22   Excell Seltzer, MD  Continuous Blood Gluc Transmit (DEXCOM G6 TRANSMITTER) MISC USE AS DIRECTED FOR CONTINUOUS GLUCOSE MONITORING. REUSE TRANSMITTER X 90DAYS THEN DISCARD & REPLACE 07/05/21   Bedsole, Amy E, MD  escitalopram (LEXAPRO) 10 MG tablet Take 10 mg by mouth daily. 09/09/23   [provider]  Glucagon, rDNA, (GLUCAGON EMERGENCY) 1 MG KIT Inject 1 mg into the skin See admin instructions. Inject one mg into the skin see administration instructions. Follow package directions for low blood sugar. 06/18/23   [provider]  levothyroxine (SYNTHROID) 137 MCG tablet Take 137 mcg by mouth daily before breakfast. 03/19/22   [provider]  metoCLOPramide (REGLAN) 5 MG tablet Take 1 tablet (5 mg total) by mouth 2 (two) times daily. Take 30  minutes before breakfast and dinner. 10/21/23   Arnaldo Natal, NP  midodrine (PROAMATINE) 5 MG tablet Take 1 tablet (5 mg total) by mouth 2 (two) times daily with a meal. Hold if your systolic blood pressure  ( top number) is > , please check your blood pressure at home once a day and bring in record for your doctor to review, follow up with your pcp and cardiology regarding your blood pressure medication Patient taking differently: Take 5 mg by mouth daily. 02/06/23   Albertine Grates, MD  NOVOLOG FLEXPEN 100 UNIT/ML FlexPen Inject 4 Units into the skin 3 (three) times daily with meals.    [provider]  omega-3 acid ethyl esters (LOVAZA) 1 g capsule Take 2 capsules (2 g total) by mouth 2 (two) times daily. 09/13/23   Alwyn Ren, MD  pantoprazole (PROTONIX) 40 MG tablet Take 1 tablet (40 mg total) by mouth 2 (two) times daily. 10/28/23 11/27/23  Almon Hercules, MD  QUEtiapine (SEROQUEL) 50 MG tablet Take 50 mg by mouth at bedtime. 09/09/23   [provider]  ranolazine (RANEXA) 500 MG 12 hr tablet Take 500 mg by mouth 2 (two) times daily. 07/23/23   [provider]  rosuvastatin (CRESTOR) 10 MG tablet Take 10 mg by mouth daily. 06/17/23   [provider]  sucralfate (CARAFATE) 1 GM/10ML suspension Take 10 mLs (1 g total) by mouth 2 (two) times daily. Do not take any other medication within 2-4 hours 10/28/23   Arnaldo Natal,  NP  tamsulosin (FLOMAX) 0.4 MG CAPS capsule Take 1 capsule (0.4 mg total) by mouth daily after supper. 09/04/23   Arnetha Courser, MD  TOUJEO SOLOSTAR 300 UNIT/ML Solostar Pen Inject 25 Units into the skin at bedtime. 10/28/23   Almon Hercules, MD  traZODone (DESYREL) 50 MG tablet Take 50 mg by mouth at bedtime.    [provider]      Allergies    Codeine    Review of Systems   Review of Systems  Constitutional:  Positive for appetite change and fatigue. Negative for fever.  Respiratory:  Negative for  shortness of breath.   Cardiovascular:  Negative for chest pain.  Gastrointestinal:  Positive for abdominal pain, nausea and vomiting. Negative for abdominal distention and diarrhea.  Endocrine: Positive for polydipsia.  Skin:  Negative for rash.  Neurological:  Negative for headaches.    Physical Exam Updated Vital Signs BP 124/76   Pulse (!) 105   Temp 98.4 F (36.9 C) (Oral)   Resp 16   Ht 4\' 11"  (1.499 m)   Wt 57.6 kg   LMP 08/11/2012   SpO2 100%   BMI 25.65 kg/m  Physical Exam Vitals and nursing note reviewed.  Constitutional:      General: She is not in acute distress.    Appearance: Normal appearance.  HENT:     Head: Normocephalic.     Mouth/Throat:     Mouth: Mucous membranes are moist.  Cardiovascular:     Rate and Rhythm: Tachycardia present.  Pulmonary:     Effort: Pulmonary effort is normal. No respiratory distress.  Abdominal:     General: Bowel sounds are decreased. There is no distension.     Palpations: Abdomen is soft.     Tenderness: There is generalized abdominal tenderness.  Skin:    General: Skin is warm.  Neurological:     Mental Status: She is alert and oriented to person, place, and time. Mental status is at baseline.  Psychiatric:        Mood and Affect: Mood normal.     ED Results / Procedures / Treatments   Labs (all labs ordered are listed, but only abnormal results are displayed) Labs Reviewed  CBG MONITORING, ED - Abnormal; Notable for the following components:      Result Value   Glucose-Capillary 481 (*)    All other components within normal limits  CBC WITH DIFFERENTIAL/PLATELET  LIPASE, BLOOD  COMPREHENSIVE METABOLIC PANEL  URINALYSIS, ROUTINE W REFLEX MICROSCOPIC  BLOOD GAS, VENOUS    EKG None  Radiology No results found.  Procedures Procedures    Medications Ordered in ED Medications  HYDROmorphone (DILAUDID) injection 1 mg (has no administration in time range)  lactated ringers bolus 500 mL (has no  administration in time range)  ondansetron (ZOFRAN) injection 4 mg (has no administration in time range)    ED Course/ Medical Decision Making/ A&P                                 Medical Decision Making Amount and/or Complexity of Data Reviewed Radiology: ordered.  Risk Prescription drug management. Decision regarding hospitalization.   62 year old female presents to the emergency department with abdominal pain, nausea/vomiting/diarrhea.  History of gastric ulcer, possible stomach dysmotility.  Patient was tachycardic on arrival, abdomen is diffusely tender, moderately on exam.  Blood work shows a mild leukocytosis.  There is findings of dehydration with  hyponatremia, hypokalemia.  Lipase is slightly elevated. She is hyperglycemic without any findings of DKA/HHS.  Urinalysis shows no infection.  Patient been treating with IV medication.  Blood pressure became soft with systolic in the 80s, IV hydration began.  CT abdomen pelvis shows a large stomach, possible dysmotility.  There is thickening around the duodenum consistent with gastric ulcer disease.  No perforation.  Remainder of the CT is unremarkable.  There is a large bladder, however this seems baseline for the patient.  She has been able to pee freely without any retention.  On reevaluation patient is unable to tolerate p.o., additional medications ordered.  Will plan for admission.  Patients evaluation and results requires admission for further treatment and care.  Spoke with hospitalist, reviewed patient's ED course and they accept admission.  Patient agrees with admission plan, offers no new complaints and is stable/unchanged at time of admit.          Final Clinical Impression(s) / ED Diagnoses Final diagnoses:  None    Rx / DC Orders ED Discharge Orders     None         Rozelle Logan, DO 11/11/23 2302

## 2023-11-11 NOTE — ED Triage Notes (Addendum)
Pt is coming from home. Husband called EMS over concerns regarding her blood sugar. Pts CBG at home was 489, endorses N/V/D. Pt is non-compliant with meds. Pt also complaining of abd pain, has hx of gastric ulcers.

## 2023-11-11 NOTE — ED Provider Triage Note (Signed)
Emergency Medicine Provider Triage Evaluation Note  MAISIE KRISTOFF , a 62 y.o. female  was evaluated in triage.  Pt complains of abd pain, hyperglycemia.  Review of Systems  Positive: Abdominal pain, vomiting, diarrhea Negative: Hematemesis, fever  Physical Exam  BP 124/76   Pulse (!) 105   Temp 98.4 F (36.9 C) (Oral)   Resp 16   Ht 4\' 11"  (1.499 m)   Wt 57.6 kg   LMP 08/11/2012   SpO2 100%   BMI 25.65 kg/m  Gen:   Awake, no distress   Resp:  Normal effort  MSK:   Moves extremities without difficulty  Other:  Chronically ill appearing  Medical Decision Making  Medically screening exam initiated at 3:41 PM.  Appropriate orders placed.  Jordann C Torre was informed that the remainder of the evaluation will be completed by another provider, this initial triage assessment does not replace that evaluation, and the importance of remaining in the ED until their evaluation is complete.  Patient with h/o pancreatitis, DM, noncompliance here by EMS called by husband for high blood sugar.    Elpidio Anis, PA-C 11/11/23 1610

## 2023-11-12 DIAGNOSIS — R339 Retention of urine, unspecified: Secondary | ICD-10-CM | POA: Diagnosis not present

## 2023-11-12 DIAGNOSIS — E860 Dehydration: Secondary | ICD-10-CM | POA: Diagnosis not present

## 2023-11-12 DIAGNOSIS — Z794 Long term (current) use of insulin: Secondary | ICD-10-CM | POA: Diagnosis not present

## 2023-11-12 DIAGNOSIS — I129 Hypertensive chronic kidney disease with stage 1 through stage 4 chronic kidney disease, or unspecified chronic kidney disease: Secondary | ICD-10-CM | POA: Diagnosis not present

## 2023-11-12 DIAGNOSIS — I10 Essential (primary) hypertension: Secondary | ICD-10-CM | POA: Diagnosis not present

## 2023-11-12 DIAGNOSIS — I480 Paroxysmal atrial fibrillation: Secondary | ICD-10-CM

## 2023-11-12 DIAGNOSIS — K3184 Gastroparesis: Secondary | ICD-10-CM | POA: Diagnosis present

## 2023-11-12 DIAGNOSIS — R1013 Epigastric pain: Secondary | ICD-10-CM | POA: Diagnosis not present

## 2023-11-12 DIAGNOSIS — K859 Acute pancreatitis without necrosis or infection, unspecified: Secondary | ICD-10-CM | POA: Diagnosis not present

## 2023-11-12 DIAGNOSIS — N182 Chronic kidney disease, stage 2 (mild): Secondary | ICD-10-CM | POA: Diagnosis not present

## 2023-11-12 DIAGNOSIS — Z79899 Other long term (current) drug therapy: Secondary | ICD-10-CM | POA: Diagnosis not present

## 2023-11-12 DIAGNOSIS — E785 Hyperlipidemia, unspecified: Secondary | ICD-10-CM | POA: Diagnosis not present

## 2023-11-12 DIAGNOSIS — R0789 Other chest pain: Secondary | ICD-10-CM | POA: Diagnosis not present

## 2023-11-12 DIAGNOSIS — F32A Depression, unspecified: Secondary | ICD-10-CM

## 2023-11-12 DIAGNOSIS — E1065 Type 1 diabetes mellitus with hyperglycemia: Secondary | ICD-10-CM

## 2023-11-12 DIAGNOSIS — E876 Hypokalemia: Secondary | ICD-10-CM | POA: Diagnosis not present

## 2023-11-12 DIAGNOSIS — K2980 Duodenitis without bleeding: Secondary | ICD-10-CM | POA: Diagnosis not present

## 2023-11-12 DIAGNOSIS — K219 Gastro-esophageal reflux disease without esophagitis: Secondary | ICD-10-CM | POA: Diagnosis present

## 2023-11-12 DIAGNOSIS — I5032 Chronic diastolic (congestive) heart failure: Secondary | ICD-10-CM

## 2023-11-12 DIAGNOSIS — Z87891 Personal history of nicotine dependence: Secondary | ICD-10-CM | POA: Diagnosis not present

## 2023-11-12 DIAGNOSIS — E039 Hypothyroidism, unspecified: Secondary | ICD-10-CM | POA: Diagnosis not present

## 2023-11-12 DIAGNOSIS — Z79890 Hormone replacement therapy (postmenopausal): Secondary | ICD-10-CM | POA: Diagnosis not present

## 2023-11-12 DIAGNOSIS — K2970 Gastritis, unspecified, without bleeding: Secondary | ICD-10-CM | POA: Diagnosis not present

## 2023-11-12 DIAGNOSIS — E119 Type 2 diabetes mellitus without complications: Secondary | ICD-10-CM | POA: Diagnosis not present

## 2023-11-12 DIAGNOSIS — I252 Old myocardial infarction: Secondary | ICD-10-CM | POA: Diagnosis not present

## 2023-11-12 DIAGNOSIS — E872 Acidosis, unspecified: Secondary | ICD-10-CM | POA: Diagnosis not present

## 2023-11-12 DIAGNOSIS — E1043 Type 1 diabetes mellitus with diabetic autonomic (poly)neuropathy: Secondary | ICD-10-CM | POA: Diagnosis not present

## 2023-11-12 DIAGNOSIS — E1022 Type 1 diabetes mellitus with diabetic chronic kidney disease: Secondary | ICD-10-CM | POA: Diagnosis not present

## 2023-11-12 DIAGNOSIS — T383X6A Underdosing of insulin and oral hypoglycemic [antidiabetic] drugs, initial encounter: Secondary | ICD-10-CM | POA: Diagnosis present

## 2023-11-12 DIAGNOSIS — K2990 Gastroduodenitis, unspecified, without bleeding: Secondary | ICD-10-CM | POA: Diagnosis not present

## 2023-11-12 DIAGNOSIS — Z8711 Personal history of peptic ulcer disease: Secondary | ICD-10-CM | POA: Diagnosis not present

## 2023-11-12 DIAGNOSIS — M810 Age-related osteoporosis without current pathological fracture: Secondary | ICD-10-CM | POA: Diagnosis present

## 2023-11-12 DIAGNOSIS — N179 Acute kidney failure, unspecified: Secondary | ICD-10-CM | POA: Diagnosis not present

## 2023-11-12 LAB — CBC
HCT: 39.6 % (ref 36.0–46.0)
Hemoglobin: 13.4 g/dL (ref 12.0–15.0)
MCH: 29 pg (ref 26.0–34.0)
MCHC: 33.8 g/dL (ref 30.0–36.0)
MCV: 85.7 fL (ref 80.0–100.0)
Platelets: 309 10*3/uL (ref 150–400)
RBC: 4.62 MIL/uL (ref 3.87–5.11)
RDW: 15 % (ref 11.5–15.5)
WBC: 10 10*3/uL (ref 4.0–10.5)
nRBC: 0 % (ref 0.0–0.2)

## 2023-11-12 LAB — RESP PANEL BY RT-PCR (RSV, FLU A&B, COVID)  RVPGX2
Influenza A by PCR: NEGATIVE
Influenza B by PCR: NEGATIVE
Resp Syncytial Virus by PCR: NEGATIVE
SARS Coronavirus 2 by RT PCR: NEGATIVE

## 2023-11-12 LAB — BASIC METABOLIC PANEL
Anion gap: 7 (ref 5–15)
BUN: 16 mg/dL (ref 6–20)
CO2: 29 mmol/L (ref 22–32)
Calcium: 8.5 mg/dL — ABNORMAL LOW (ref 8.9–10.3)
Chloride: 94 mmol/L — ABNORMAL LOW (ref 98–111)
Creatinine, Ser: 0.66 mg/dL (ref 0.44–1.00)
GFR, Estimated: >60 mL/min (ref >60)
Glucose, Bld: 156 mg/dL — ABNORMAL HIGH (ref 70–99)
Potassium: 3.6 mmol/L (ref 3.5–5.1)
Sodium: 130 mmol/L — ABNORMAL LOW (ref 135–145)

## 2023-11-12 LAB — CBG MONITORING, ED
Glucose-Capillary: 206 mg/dL — ABNORMAL HIGH (ref 70–99)
Glucose-Capillary: 243 mg/dL — ABNORMAL HIGH (ref 70–99)

## 2023-11-12 MED ORDER — RANOLAZINE ER 500 MG PO TB12
500.0000 mg | ORAL_TABLET | Freq: Two times a day (BID) | ORAL | Status: DC
  Administered 2023-11-12: 500 mg via ORAL
  Filled 2023-11-12 (×2): qty 1

## 2023-11-12 MED ORDER — ROSUVASTATIN CALCIUM 20 MG PO TABS
10.0000 mg | ORAL_TABLET | Freq: Every day | ORAL | Status: DC
  Administered 2023-11-12: 10 mg via ORAL
  Filled 2023-11-12: qty 1

## 2023-11-12 MED ORDER — SODIUM CHLORIDE 0.9 % IV SOLN
12.5000 mg | Freq: Four times a day (QID) | INTRAVENOUS | Status: DC | PRN
  Administered 2023-11-12: 12.5 mg via INTRAVENOUS
  Filled 2023-11-12: qty 0.5

## 2023-11-12 MED ORDER — POTASSIUM CHLORIDE CRYS ER 20 MEQ PO TBCR
40.0000 meq | EXTENDED_RELEASE_TABLET | Freq: Once | ORAL | Status: AC
  Administered 2023-11-12: 40 meq via ORAL
  Filled 2023-11-12: qty 2

## 2023-11-12 MED ORDER — OMEGA-3-ACID ETHYL ESTERS 1 G PO CAPS
2.0000 g | ORAL_CAPSULE | Freq: Two times a day (BID) | ORAL | Status: DC
  Filled 2023-11-12: qty 2

## 2023-11-12 MED ORDER — SUCRALFATE 1 GM/10ML PO SUSP: 1.0000 g | Freq: Two times a day (BID) | ORAL | Status: DC

## 2023-11-12 MED ORDER — MIDODRINE HCL 5 MG PO TABS
5.0000 mg | ORAL_TABLET | Freq: Two times a day (BID) | ORAL | Status: DC
  Administered 2023-11-12: 5 mg via ORAL
  Filled 2023-11-12: qty 1

## 2023-11-12 MED ORDER — ASPIRIN 81 MG PO TBEC: 81.0000 mg | DELAYED_RELEASE_TABLET | Freq: Every day | ORAL | Status: DC

## 2023-11-12 MED ORDER — PANTOPRAZOLE SODIUM 40 MG PO TBEC
40.0000 mg | DELAYED_RELEASE_TABLET | Freq: Two times a day (BID) | ORAL | Status: DC
  Administered 2023-11-12 (×2): 40 mg via ORAL
  Filled 2023-11-12 (×2): qty 1

## 2023-11-12 MED ORDER — ACETAMINOPHEN 325 MG PO TABS: 650.0000 mg | ORAL_TABLET | Freq: Four times a day (QID) | ORAL | Status: DC | PRN

## 2023-11-12 MED ORDER — INSULIN GLARGINE-YFGN 100 UNIT/ML ~~LOC~~ SOLN
12.0000 [IU] | Freq: Every day | SUBCUTANEOUS | Status: DC
  Administered 2023-11-12: 12 [IU] via SUBCUTANEOUS
  Filled 2023-11-12 (×2): qty 0.12

## 2023-11-12 MED ORDER — LEVOTHYROXINE SODIUM 137 MCG PO TABS
137.0000 ug | ORAL_TABLET | Freq: Every day | ORAL | Status: DC
  Administered 2023-11-12: 137 ug via ORAL
  Filled 2023-11-12: qty 1

## 2023-11-12 MED ORDER — ESCITALOPRAM OXALATE 10 MG PO TABS
10.0000 mg | ORAL_TABLET | Freq: Every day | ORAL | Status: DC
  Administered 2023-11-12: 10 mg via ORAL
  Filled 2023-11-12: qty 1

## 2023-11-12 MED ORDER — POTASSIUM CHLORIDE IN NACL 20-0.9 MEQ/L-% IV SOLN: INTRAVENOUS | Status: DC

## 2023-11-12 MED ORDER — FENTANYL CITRATE PF 50 MCG/ML IJ SOSY
12.5000 ug | PREFILLED_SYRINGE | INTRAMUSCULAR | Status: DC | PRN
  Administered 2023-11-12: 12.5 ug via INTRAVENOUS
  Filled 2023-11-12: qty 1

## 2023-11-12 MED ORDER — TAMSULOSIN HCL 0.4 MG PO CAPS: 0.4000 mg | ORAL_CAPSULE | Freq: Every day | ORAL | Status: DC

## 2023-11-12 MED ORDER — METOCLOPRAMIDE HCL 10 MG PO TABS: 5.0000 mg | ORAL_TABLET | Freq: Two times a day (BID) | ORAL | Status: DC

## 2023-11-12 MED ORDER — INSULIN ASPART 100 UNIT/ML IJ SOLN
0.0000 [IU] | Freq: Three times a day (TID) | INTRAMUSCULAR | Status: DC
  Administered 2023-11-12: 5 [IU] via SUBCUTANEOUS
  Filled 2023-11-12: qty 0.15

## 2023-11-12 MED ORDER — METOCLOPRAMIDE HCL 5 MG PO TABS: 5.0000 mg | ORAL_TABLET | Freq: Three times a day (TID) | ORAL | 2 refills | Status: DC

## 2023-11-12 MED ORDER — ENOXAPARIN SODIUM 40 MG/0.4ML IJ SOSY
40.0000 mg | PREFILLED_SYRINGE | INTRAMUSCULAR | Status: DC
  Administered 2023-11-12: 40 mg via SUBCUTANEOUS
  Filled 2023-11-12: qty 0.4

## 2023-11-12 MED ORDER — QUETIAPINE FUMARATE 50 MG PO TABS
50.0000 mg | ORAL_TABLET | Freq: Every day | ORAL | Status: DC
  Administered 2023-11-12: 50 mg via ORAL
  Filled 2023-11-12: qty 1

## 2023-11-12 MED ORDER — METOCLOPRAMIDE HCL 5 MG/ML IJ SOLN
10.0000 mg | Freq: Three times a day (TID) | INTRAMUSCULAR | Status: DC
  Administered 2023-11-12: 10 mg via INTRAVENOUS
  Filled 2023-11-12: qty 2

## 2023-11-12 MED ORDER — ONDANSETRON HCL 4 MG/2ML IJ SOLN: 4.0000 mg | Freq: Four times a day (QID) | INTRAMUSCULAR | Status: DC | PRN

## 2023-11-12 NOTE — Assessment & Plan Note (Signed)
continue statin

## 2023-11-12 NOTE — Assessment & Plan Note (Signed)
-  last echo in 2022 with EF 55-60% with grade 1 diastolic dysfunction with no significant valvular dysfunction -stable

## 2023-11-12 NOTE — H&P (Signed)
History and Physical    Patient: [name] [name]:[id number] DOB: 16-Jun-1961 DOA: 11/11/2023 DOS: the patient was seen and examined on 11/12/2023 PCP: Everardo All, NP  Patient coming from: Home  Chief Complaint:  Chief Complaint  Patient presents with   Abdominal Pain   Hyperglycemia   HPI: [name] [name] is a 62 y.o. female with medical history significant of uncontrolled type 1 diabetes, gastroparesis, pancreatitis, diastolic CHF, paroxysmal atrial fibrillation not on anticoagulation, hypertension, hypothyroidism, duodenitis/duodenal ulcer, depression who presents with worsening abdominal pain, nausea, vomiting and diarrhea.   Reports epigastric pain worsened yesterday with vomiting and diarrhea. She has know duodenitis and duodenal ulcer. Reports compliance with PPI, Carafate and Reglan. Denies any alcohol or NSAIDS use. No fever. Has new runny nose. No cough or shortness of breath.  She has been taking lower doses of her insulin due to fear of hypoglycemia. Reports 12 units of insulin at night and sliding scale.   Patient was last hospitalized in mid November for progressive abdominal pain thought secondary to gastroparesis.  She was previously admitted prior to that in October and underwent EGD/EUS with severe LA grade D erosive esophagitis, gastritis and duodenal ulcer.  She was started on PPI and Carafate.  Patient's symptoms eventually resolved with scheduled Reglan and bowel rest.  On arrival to ED, she was afebrile, heart rate of 105 and normotensive.  CBC with leukocytosis of 12.8, no anemia.  CMP notable for hyponatremia of 130 in the setting of hyperglycemia with glucose of 486.  Normal anion gap.  Hypokalemia of 3.2. LFTs limits.  Lipase is elevated.  CT abdomen pelvis with contrast showed known peptic ulcer disease with wall thickening and edema in the pylorus and duodenal bulb region.  Normal gallbladder and bile ducts.  Unremarkable pancreas without inflammatory  changes.  Patient was given IV LR bolus, multiple doses of short acting insulin, and Dilaudid for pain.  Patient did have some transient hypotension with SBP of 88 and hypoxia down to 70% possibly that resolved spontaneously.  Thought possibly secondary to opioid dose.  Patient continued to have persistent abdominal pain and unable to tolerate oral intake so hospitalist was consulted for admission. Review of Systems: As mentioned in the history of present illness. All other systems reviewed and are negative. Past Medical History:  Diagnosis Date   Allergy    seasonal   Anxiety    B12 deficiency    Chest pain 04/05/2017   CHF (congestive heart failure) ([name])    Diabetes mellitus type 1, uncontrolled    "dr. 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) 10/29/2013   Leg cramping    "@ night" (09/18/2013)   Migraine    "once q couple months" (11/26/2018)   Osteoporosis    Peripheral neuropathy    Umbilical hernia    unrepaired (09/18/2013)   Past Surgical History:  Procedure Laterality Date   BIOPSY  06/03/2019   Procedure: BIOPSY;  Surgeon: Lynann Bologna, MD;  Location: [facility];  Service: Endoscopy;;   BIOPSY  08/17/2020   Procedure: BIOPSY;  Surgeon: Tressia Danas, MD;  Location: [facility];  Service: Gastroenterology;;   BIOPSY  02/05/2023   Procedure: BIOPSY;  Surgeon: Meryl Dare,  MD;  Location: [facility];  Service: Gastroenterology;;   BIOPSY  09/24/2023   Procedure: BIOPSY;  Surgeon: Lemar Lofty., MD;  Location: [facility];  Service: Gastroenterology;;   CESAREAN SECTION  1989   ENTEROSCOPY N/A 06/03/2019   Procedure: ENTEROSCOPY;  Surgeon: Lynann Bologna, MD;  Location: [facility];   Service: Endoscopy;  Laterality: N/A;   ESOPHAGOGASTRODUODENOSCOPY (EGD) WITH PROPOFOL N/A 08/17/2020   Procedure: ESOPHAGOGASTRODUODENOSCOPY (EGD) WITH PROPOFOL;  Surgeon: Tressia Danas, MD;  Location: [facility];  Service: Gastroenterology;  Laterality: N/A;   ESOPHAGOGASTRODUODENOSCOPY (EGD) WITH PROPOFOL N/A 02/05/2023   Procedure: ESOPHAGOGASTRODUODENOSCOPY (EGD) WITH PROPOFOL;  Surgeon: Meryl Dare, MD;  Location: [facility];  Service: Gastroenterology;  Laterality: N/A;   ESOPHAGOGASTRODUODENOSCOPY (EGD) WITH PROPOFOL N/A 09/24/2023   Procedure: ESOPHAGOGASTRODUODENOSCOPY (EGD) WITH PROPOFOL;  Surgeon: Meridee Score Netty Starring., MD;  Location: [facility];  Service: Gastroenterology;  Laterality: N/A;   EUS N/A 09/24/2023   Procedure: UPPER ENDOSCOPIC ULTRASOUND (EUS) LINEAR;  Surgeon: Lemar Lofty., MD;  Location: [facility];  Service: Gastroenterology;  Laterality: N/A;   LAPAROSCOPIC ASSISTED VAGINAL HYSTERECTOMY  09/23/2012   Procedure: LAPAROSCOPIC ASSISTED VAGINAL HYSTERECTOMY;  Surgeon: Mitchel Honour, DO;  Location: [facility];  Service: Gynecology;  Laterality: N/A;  pull Dr Vance Gather instrument   LEFT HEART CATH AND CORONARY ANGIOGRAPHY N/A 02/11/2017   Procedure: Left Heart Cath and Coronary Angiography;  Surgeon: Runell Gess, MD;  Location: [facility] INVASIVE CV LAB;  Service: Cardiovascular;  Laterality: N/A;   TUBAL LIGATION Bilateral 1992   Social History:  reports that she quit smoking about 32 years ago. Her smoking use included cigarettes. She started smoking about 42 years ago. She has a 1.2 pack-year smoking history. She has never used smokeless tobacco. She reports that she does not currently use drugs after having used the following drugs: Marijuana. She reports that she does not drink alcohol.  Allergies  Allergen Reactions   Codeine Hives, Anxiety and Other (See Comments)    Family History  Problem Relation Age of Onset   Diabetes Other    Heart disease  Other    Heart failure Mother 28       Her heart skipped a beat and stopped - per pt   Diabetes Maternal Grandmother    Alzheimer's disease Maternal Grandmother    Coronary artery disease Maternal Grandmother    Heart attack Maternal Grandfather    Heart failure Brother    Diabetes Brother    Colon polyps Neg Hx    Esophageal cancer Neg Hx    Liver cancer Neg Hx    Pancreatic cancer Neg Hx    Stomach cancer Neg Hx    Crohn's disease Neg Hx    Rectal cancer Neg Hx     Prior to Admission medications   Medication Sig Start Date End Date Taking? Authorizing Provider  aspirin EC 81 MG tablet Take 81 mg by mouth daily. Swallow whole.   Yes [provider]  azelastine (ASTELIN) 0.1 % nasal spray Place 1 spray into both nostrils 2 (two) times daily. 11/04/23  Yes [provider]  escitalopram (LEXAPRO) 10 MG tablet Take 10 mg by mouth daily. 09/09/23  Yes [provider]  Glucagon, rDNA, (GLUCAGON EMERGENCY) 1 MG KIT Inject 1 mg into the skin See admin instructions. Inject one mg into the skin see administration instructions. Follow package directions for low blood sugar. 06/18/23  Yes [provider]  levothyroxine (SYNTHROID) 137 MCG tablet Take 137 mcg by mouth daily  before breakfast. 03/19/22  Yes [provider]  metoCLOPramide (REGLAN) 5 MG tablet Take 1 tablet (5 mg total) by mouth 2 (two) times daily. Take 30 minutes before breakfast and dinner. 10/21/23  Yes Arnaldo Natal, NP  midodrine (PROAMATINE) 5 MG tablet Take 1 tablet (5 mg total) by mouth 2 (two) times daily with a meal. Hold if your systolic blood pressure  ( top number) is > , please check your blood pressure at home once a day and bring in record for your doctor to review, follow up with your pcp and cardiology regarding your blood pressure medication Patient taking differently: Take 5 mg by mouth daily. 02/06/23  Yes Albertine Grates, MD  NOVOLOG FLEXPEN 100 UNIT/ML FlexPen  Inject 4 Units into the skin 4 (four) times daily. Per sliding sclae   Yes [provider]  omega-3 acid ethyl esters (LOVAZA) 1 g capsule Take 2 capsules (2 g total) by mouth 2 (two) times daily. 09/13/23  Yes Alwyn Ren, MD  pantoprazole (PROTONIX) 40 MG tablet Take 1 tablet (40 mg total) by mouth 2 (two) times daily. 10/28/23 11/27/23 Yes Almon Hercules, MD  QUEtiapine (SEROQUEL) 50 MG tablet Take 50 mg by mouth at bedtime. 09/09/23  Yes [provider]  ranolazine (RANEXA) 500 MG 12 hr tablet Take 500 mg by mouth 2 (two) times daily. 07/23/23  Yes [provider]  rosuvastatin (CRESTOR) 10 MG tablet Take 10 mg by mouth daily. 06/17/23  Yes [provider]  sucralfate (CARAFATE) 1 GM/10ML suspension Take 10 mLs (1 g total) by mouth 2 (two) times daily. Do not take any other medication within 2-4 hours 10/28/23  Yes Arnaldo Natal, NP  tamsulosin (FLOMAX) 0.4 MG CAPS capsule Take 1 capsule (0.4 mg total) by mouth daily after supper. 09/04/23  Yes Arnetha Courser, MD  TOUJEO SOLOSTAR 300 UNIT/ML Solostar Pen Inject 25 Units into the skin at bedtime. 10/28/23  Yes Almon Hercules, MD  traZODone (DESYREL) 50 MG tablet Take 50 mg by mouth at bedtime.   Yes [provider]  Continuous Blood Gluc Sensor (DEXCOM G6 SENSOR) MISC INJECT 1 SENSOR INTO SKIN EVERY 10 DAYS Patient taking differently: Inject 1 Device into the skin See admin instructions. Place 1 new device into the skin every 10 days 07/18/22   Excell Seltzer, MD  Continuous Blood Gluc Transmit (DEXCOM G6 TRANSMITTER) MISC USE AS DIRECTED FOR CONTINUOUS GLUCOSE MONITORING. REUSE TRANSMITTER X 90DAYS THEN DISCARD & REPLACE 07/05/21   Excell Seltzer, MD    Physical Exam: Vitals:   11/11/23 2120 11/11/23 2315 11/11/23 2327 11/12/23 0000  BP: 122/88 111/65  108/71  Pulse: 89   81  Resp: 18   18  Temp:   97.6 F (36.4 C)   TempSrc:   Oral   SpO2: 97%   99%  Weight:      Height:        Constitutional: NAD, calm, comfortable, thin frail chronically ill appearing female lying flat in bed Eyes: lids and conjunctivae normal ENMT: Mucous membranes are moist.  Neck: normal, supple Respiratory: clear to auscultation bilaterally, no wheezing, no crackles. Normal respiratory effort. No accessory muscle use. On 2L via [place].  Cardiovascular: Regular rate and rhythm, no murmurs / rubs / gallops. No extremity edema.  Abdomen:soft, moderate epigastric tenderness, moderate distention. No rebound tenderness, guarding or rigidity.  Musculoskeletal: no clubbing / cyanosis. No joint deformity upper and lower extremities. Normal muscle tone.  Skin: no  rashes, lesions, ulcers. No induration Neurologic: CN 2-12 grossly intact. Psychiatric: Normal judgment and insight. Alert and oriented x 3. Normal mood.   Data Reviewed:  See hpi  Assessment and Plan: * Abdominal pain With associated nausea, vomiting and diarrhea -likely from gastroparesis, known duodenal ulcer in the setting of uncontrolled diabetes. Last A1C >15 in 08/2023. LFTs within normal limits. Lipase elevated but no findings of pancreatitis on CT. CT demonstrated wall thickening and edema in the pylorus and duodenal bulb region. -will also r/o COVID  -will continue PPI, reglan and Carafate  -continue insulin at bedtime with sliding scale for glycemia control  -PRN IV opioids- pt had transient hypotension follow IV dilaudid in ED so will use judiciously  Essential hypertension -pt had transient hypotension following opioids -she is on midodrine. Will continue.   Hypothyroidism -continue levothyroxine  Chronic diastolic HF (heart failure) ([name]) -last echo in 2022 with EF 55-60% with grade 1 diastolic dysfunction with no significant valvular dysfunction -stable  Hyperlipidemia -continue statin  Depression -continue lexapro   Duodenitis -following with [facility]. Has planned repeat endoscopy 12/25/2023 -continue PPI,  Carafate   Hypokalemia -administer oral potassium  Paroxysmal A-fib ([name]) -controlled. Not on anticoagulation or rate-controlled.       Advance Care Planning:   Code Status: Full Code   Consults: none   Family Communication: none at bedside  Severity of Illness: The appropriate patient status for this patient is OBSERVATION. Observation status is judged to be reasonable and necessary in order to provide the required intensity of service to ensure the patient's safety. The patient's presenting symptoms, physical exam findings, and initial radiographic and laboratory data in the context of their medical condition is felt to place them at decreased risk for further clinical deterioration. Furthermore, it is anticipated that the patient will be medically stable for discharge from the hospital within 2 midnights of admission.   Author: Anselm Jungling, DO 11/12/2023 1:23 AM  For on call review [email or web address].

## 2023-11-12 NOTE — Assessment & Plan Note (Signed)
continue levothyroxine

## 2023-11-12 NOTE — Assessment & Plan Note (Signed)
With associated nausea, vomiting and diarrhea -likely from gastroparesis, known duodenal ulcer in the setting of uncontrolled diabetes. Last A1C >15 in 08/2023. LFTs within normal limits. Lipase elevated but no findings of pancreatitis on CT. CT demonstrated wall thickening and edema in the pylorus and duodenal bulb region. -will also r/o COVID  -will continue PPI, reglan and Carafate  -continue insulin at bedtime with sliding scale for glycemia control  -PRN IV opioids- pt had transient hypotension follow IV dilaudid in ED so will use judiciously

## 2023-11-12 NOTE — Assessment & Plan Note (Signed)
-  administer oral potassium

## 2023-11-12 NOTE — Assessment & Plan Note (Signed)
-   continue lexapro  

## 2023-11-12 NOTE — H&P (Incomplete)
History and Physical    Patient: [name] [name] RSW:[id number] DOB: 1961-02-08 DOA: 11/11/2023 DOS: the patient was seen and examined on 11/12/2023 PCP: Everardo All, NP  Patient coming from: Home  Chief Complaint:  Chief Complaint  Patient presents with  . Abdominal Pain  . Hyperglycemia   HPI: [name] [name] is a 62 y.o. female with medical history significant of ***  Review of Systems: {ROS_Text:26778} Past Medical History:  Diagnosis Date  . Allergy    seasonal  . Anxiety   . B12 deficiency   . Chest pain 04/05/2017  . CHF (congestive heart failure) ([name])   . Diabetes mellitus type 1, uncontrolled    "dr. just changed me to type 1, uncontrolled" (11/26/2018)  . DKA (diabetic ketoacidoses) 05/25/2018  . GERD (gastroesophageal reflux disease)   . Hyperlipidemia   . Hypertension   . Hypothyroidism   . IBS (irritable bowel syndrome)   . Intermittent vertigo   . 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 Leg cramping    "@ night" (09/18/2013)  . Migraine    "once q couple months" (11/26/2018)  . Osteoporosis   . Peripheral neuropathy   . Umbilical hernia    unrepaired (09/18/2013)   Past Surgical History:  Procedure Laterality Date  . BIOPSY  06/03/2019   Procedure: BIOPSY;  Surgeon: Lynann Bologna, MD;  Location: [facility];  Service: Endoscopy;;  . BIOPSY  08/17/2020   Procedure: BIOPSY;  Surgeon: Tressia Danas, MD;  Location: [facility];  Service: Gastroenterology;;  . BIOPSY  02/05/2023   Procedure: BIOPSY;  Surgeon: Meryl Dare, MD;  Location: [facility];  Service: Gastroenterology;;  . BIOPSY  09/24/2023   Procedure: BIOPSY;  Surgeon: Lemar Lofty., MD;  Location: [facility];  Service: Gastroenterology;;  . CESAREAN SECTION  1989  . ENTEROSCOPY N/A 06/03/2019   Procedure: ENTEROSCOPY;  Surgeon: Lynann Bologna, MD;  Location: [facility];  Service: Endoscopy;  Laterality: N/A;  . ESOPHAGOGASTRODUODENOSCOPY  (EGD) WITH PROPOFOL N/A 08/17/2020   Procedure: ESOPHAGOGASTRODUODENOSCOPY (EGD) WITH PROPOFOL;  Surgeon: Tressia Danas, MD;  Location: [facility];  Service: Gastroenterology;  Laterality: N/A;  . ESOPHAGOGASTRODUODENOSCOPY (EGD) WITH PROPOFOL N/A 02/05/2023   Procedure: ESOPHAGOGASTRODUODENOSCOPY (EGD) WITH PROPOFOL;  Surgeon: Meryl Dare, MD;  Location: [facility];  Service: Gastroenterology;  Laterality: N/A;  . ESOPHAGOGASTRODUODENOSCOPY (EGD) WITH PROPOFOL N/A 09/24/2023   Procedure: ESOPHAGOGASTRODUODENOSCOPY (EGD) WITH PROPOFOL;  Surgeon: Meridee Score Netty Starring., MD;  Location: [facility];  Service: Gastroenterology;  Laterality: N/A;  . EUS N/A 09/24/2023   Procedure: UPPER ENDOSCOPIC ULTRASOUND (EUS) LINEAR;  Surgeon: Lemar Lofty., MD;  Location: [facility];  Service: Gastroenterology;  Laterality: N/A;  . LAPAROSCOPIC ASSISTED VAGINAL HYSTERECTOMY  09/23/2012   Procedure: LAPAROSCOPIC ASSISTED VAGINAL HYSTERECTOMY;  Surgeon: Mitchel Honour, DO;  Location: [facility];  Service: Gynecology;  Laterality: N/A;  pull Dr Vance Gather instrument  . LEFT HEART CATH AND CORONARY ANGIOGRAPHY N/A 02/11/2017   Procedure: Left Heart Cath and Coronary Angiography;  Surgeon: Runell Gess, MD;  Location: [facility];  Service: Cardiovascular;  Laterality: N/A;  . TUBAL LIGATION Bilateral 1992   Social History:  reports that she quit smoking about 32 years ago. Her smoking use included cigarettes. She started smoking about 42 years ago. She has a 1.2 pack-year smoking history. She has never used smokeless tobacco. She reports that she does not currently use drugs after having used the following drugs: Marijuana. She reports that she does not  drink alcohol.  Allergies  Allergen Reactions  . Codeine Hives, Anxiety and Other (See Comments)    Family History  Problem Relation Age of Onset  . Diabetes Other   . Heart disease Other   . Heart failure Mother 17       Her heart  skipped a beat and stopped - per pt  . Diabetes Maternal Grandmother   . Alzheimer's disease Maternal Grandmother   . Coronary artery disease Maternal Grandmother   . Heart attack Maternal Grandfather   . Heart failure Brother   . Diabetes Brother   . Colon polyps Neg Hx   . Esophageal cancer Neg Hx   . Liver cancer Neg Hx   . Pancreatic cancer Neg Hx   . Stomach cancer Neg Hx   . Crohn's disease Neg Hx   . Rectal cancer Neg Hx     Prior to Admission medications   Medication Sig Start Date End Date Taking? Authorizing Provider  aspirin EC 81 MG tablet Take 81 mg by mouth daily. Swallow whole.   Yes [provider]  azelastine (ASTELIN) 0.1 % nasal spray Place 1 spray into both nostrils 2 (two) times daily. 11/04/23  Yes [provider]  escitalopram (LEXAPRO) 10 MG tablet Take 10 mg by mouth daily. 09/09/23  Yes [provider]  Glucagon, rDNA, (GLUCAGON EMERGENCY) 1 MG KIT Inject 1 mg into the skin See admin instructions. Inject one mg into the skin see administration instructions. Follow package directions for low blood sugar. 06/18/23  Yes [provider]  levothyroxine (SYNTHROID) 137 MCG tablet Take 137 mcg by mouth daily before breakfast. 03/19/22  Yes [provider]  metoCLOPramide (REGLAN) 5 MG tablet Take 1 tablet (5 mg total) by mouth 2 (two) times daily. Take 30 minutes before breakfast and dinner. 10/21/23  Yes Arnaldo Natal, NP  midodrine (PROAMATINE) 5 MG tablet Take 1 tablet (5 mg total) by mouth 2 (two) times daily with a meal. Hold if your systolic blood pressure  ( top number) is > , please check your blood pressure at home once a day and bring in record for your doctor to review, follow up with your pcp and cardiology regarding your blood pressure medication Patient taking differently: Take 5 mg by mouth daily. 02/06/23  Yes Albertine Grates, MD  NOVOLOG FLEXPEN 100 UNIT/ML FlexPen Inject 4 Units into the skin 4 (four)  times daily. Per sliding sclae   Yes [provider]  omega-3 acid ethyl esters (LOVAZA) 1 g capsule Take 2 capsules (2 g total) by mouth 2 (two) times daily. 09/13/23  Yes Alwyn Ren, MD  pantoprazole (PROTONIX) 40 MG tablet Take 1 tablet (40 mg total) by mouth 2 (two) times daily. 10/28/23 11/27/23 Yes Almon Hercules, MD  QUEtiapine (SEROQUEL) 50 MG tablet Take 50 mg by mouth at bedtime. 09/09/23  Yes [provider]  ranolazine (RANEXA) 500 MG 12 hr tablet Take 500 mg by mouth 2 (two) times daily. 07/23/23  Yes [provider]  rosuvastatin (CRESTOR) 10 MG tablet Take 10 mg by mouth daily. 06/17/23  Yes [provider]  sucralfate (CARAFATE) 1 GM/10ML suspension Take 10 mLs (1 g total) by mouth 2 (two) times daily. Do not take any other medication within 2-4 hours 10/28/23  Yes Arnaldo Natal, NP  tamsulosin (FLOMAX) 0.4 MG CAPS capsule Take 1 capsule (0.4 mg total) by mouth daily after supper. 09/04/23  Yes Arnetha Courser, MD  TOUJEO SOLOSTAR 300  UNIT/ML Solostar Pen Inject 25 Units into the skin at bedtime. 10/28/23  Yes Almon Hercules, MD  traZODone (DESYREL) 50 MG tablet Take 50 mg by mouth at bedtime.   Yes [provider]  Continuous Blood Gluc Sensor (DEXCOM G6 SENSOR) MISC INJECT 1 SENSOR INTO SKIN EVERY 10 DAYS Patient taking differently: Inject 1 Device into the skin See admin instructions. Place 1 new device into the skin every 10 days 07/18/22   Excell Seltzer, MD  Continuous Blood Gluc Transmit (DEXCOM G6 TRANSMITTER) MISC USE AS DIRECTED FOR CONTINUOUS GLUCOSE MONITORING. REUSE TRANSMITTER X 90DAYS THEN DISCARD & REPLACE 07/05/21   Excell Seltzer, MD    Physical Exam: Vitals:   11/11/23 1903 11/11/23 2120 11/11/23 2315 11/11/23 2327  BP:  122/88 111/65   Pulse:  89    Resp:  18    Temp: 98.1 F (36.7 C)   97.6 F (36.4 C)  TempSrc: Oral   Oral  SpO2:  97%    Weight:      Height:       *** Data Reviewed: {Tip this  will not be part of the note when signed- Document your independent interpretation of telemetry tracing, EKG, lab, Radiology test or any other diagnostic tests. Add any new diagnostic test ordered today. (Optional):26781} {Results:26384}  Assessment and Plan: No notes have been filed under this hospital service. Service: Hospitalist     Advance Care Planning:   Code Status: Prior ***  Consults: ***  Family Communication: ***  Severity of Illness: {Observation/Inpatient:21159}  Author: Anselm Jungling, DO 11/12/2023 12:01 AM  For on call review [email or web address].

## 2023-11-12 NOTE — Assessment & Plan Note (Signed)
-  following with [facility]. Has planned repeat endoscopy 12/25/2023 -continue PPI, Carafate

## 2023-11-12 NOTE — Assessment & Plan Note (Addendum)
-  pt had transient hypotension following opioids -she is on midodrine. Will continue.

## 2023-11-12 NOTE — Discharge Summary (Signed)
Physician Discharge Summary   Patient: [name] [name] MRN: [id number] DOB: 03/05/1961  Admit date:     11/11/2023  Discharge date: 11/12/23  Discharge Physician: Alberteen Sam   PCP: Everardo All, NP     Recommendations at discharge:  Follow-up with PCP for gastroparesis flare     Discharge Diagnoses: Principal Problem:   Abdominal pain due to gastroparesis flare Active Problems:   Uncontrolled type 1 diabetes mellitus with hyperglycemia, with long-term current use of insulin ([name])   Essential hypertension   Hypothyroidism   Chronic diastolic HF (heart failure) ([name])   Hyperlipidemia   Paroxysmal A-fib ([name])   Hypokalemia   Depression     Hospital Course: 62 y.o. F with DM, gastroparesis, duodenitis, pancreatitis, dCHF, pAF not on AC, HTN, hypothyroidism who presented with few days of abdominal pain, nausea, and vomiting.  In the ER, CT showed wall thickening of the pylorus and duodenal bulb, no pancreatitis, no free air, no small bowel obstruction.  Admitted for gastroparesis flare.    She was treated overnight with scheduled Reglan, IV fluids, antiemetics, as well as her home PPI and Carafate.  Her symptoms resolved with scheduled Reglan and bowel rest.  In the morning she was able to advance her diet, tolerated solid food, and was comfortable with discharge home.           The [facility] Controlled Substances Registry was reviewed for this patient prior to discharge.  Consultants: None Procedures performed: CT abdomen  Disposition: Home   DISCHARGE MEDICATION: Allergies as of 11/12/2023       Reactions   Codeine Hives, Anxiety, Other (See Comments)        Medication List     TAKE these medications    aspirin EC 81 MG tablet Take 81 mg by mouth daily. Swallow whole.   azelastine 0.1 % nasal spray Commonly known as: ASTELIN Place 1 spray into both nostrils 2 (two) times daily.   Dexcom G6 Sensor Misc INJECT 1 SENSOR INTO  SKIN EVERY 10 DAYS What changed: See the new instructions.   Dexcom G6 Transmitter Misc USE AS DIRECTED FOR CONTINUOUS GLUCOSE MONITORING. REUSE TRANSMITTER X 90DAYS THEN DISCARD & REPLACE   escitalopram 10 MG tablet Commonly known as: LEXAPRO Take 10 mg by mouth daily.   Glucagon Emergency 1 MG Kit Inject 1 mg into the skin See admin instructions. Inject one mg into the skin see administration instructions. Follow package directions for low blood sugar.   levothyroxine 137 MCG tablet Commonly known as: SYNTHROID Take 137 mcg by mouth daily before breakfast.   metoCLOPramide 5 MG tablet Commonly known as: Reglan Take 1 tablet (5 mg total) by mouth 3 (three) times daily before meals. What changed:  when to take this additional instructions   midodrine 5 MG tablet Commonly known as: PROAMATINE Take 1 tablet (5 mg total) by mouth 2 (two) times daily with a meal. Hold if your systolic blood pressure  ( top number) is > , please check your blood pressure at home once a day and bring in record for your doctor to review, follow up with your pcp and cardiology regarding your blood pressure medication What changed:  when to take this additional instructions   NovoLOG FlexPen 100 UNIT/ML FlexPen Generic drug: insulin aspart Inject 4 Units into the skin 4 (four) times daily. Per sliding sclae   omega-3 acid ethyl esters 1 g capsule Commonly known as: LOVAZA Take 2 capsules (2 g total) by mouth  2 (two) times daily.   pantoprazole 40 MG tablet Commonly known as: Protonix Take 1 tablet (40 mg total) by mouth 2 (two) times daily.   QUEtiapine 50 MG tablet Commonly known as: SEROQUEL Take 50 mg by mouth at bedtime.   ranolazine 500 MG 12 hr tablet Commonly known as: RANEXA Take 500 mg by mouth 2 (two) times daily.   rosuvastatin 10 MG tablet Commonly known as: CRESTOR Take 10 mg by mouth daily.   sucralfate 1 GM/10ML suspension Commonly known as: CARAFATE Take 10 mLs  (1 g total) by mouth 2 (two) times daily. Do not take any other medication within 2-4 hours   tamsulosin 0.4 MG Caps capsule Commonly known as: FLOMAX Take 1 capsule (0.4 mg total) by mouth daily after supper.   Toujeo SoloStar 300 UNIT/ML Solostar Pen Generic drug: insulin glargine (1 Unit Dial) Inject 25 Units into the skin at bedtime.   traZODone 50 MG tablet Commonly known as: DESYREL Take 50 mg by mouth at bedtime.        Follow-up Information     Everardo All, NP. Schedule an appointment as soon as possible for a visit in 1 week(s).   Specialty: Family Medicine                Discharge Instructions     Discharge instructions   Complete by: As directed    **IMPORTANT DISCHARGE INSTRUCTIONS**   From Dr. Maryfrances Bunnell: You were admitted for gastroparesis This is a chronic condition that flares up with vomiting, nausea and abdominal discomfort from time to time.  It is caused by slowing of the stomach due to diabetes  1. Control your sugars as best as you can 2. Eat small meals, spread out, rather than a few large meals 3. Eat bland foods for the next week 4. For the next week, take metoclopramide Reglan 5 mg three times daily before meals 5. After a week, reduce to as needed use 6. Go see your primary doctor in 1 week, resume your other home medicines without change   Increase activity slowly   Complete by: As directed        Discharge Exam: Filed Weights   11/11/23 1518  Weight: 57.6 kg    General: Pt is alert, awake, not in acute distress Cardiovascular: RRR, nl S1-S2, no murmurs appreciated.   No LE edema.   Respiratory: Normal respiratory rate and rhythm.  CTAB without rales or wheezes. Abdominal: Abdomen soft and non-tender.  No distension or HSM.   Neuro/Psych: Strength symmetric in upper and lower extremities.  Judgment and insight appear normal.   Condition at discharge: fair  The results of significant diagnostics from this  hospitalization (including imaging, microbiology, ancillary and laboratory) are listed below for reference.   Imaging Studies: CT ABDOMEN PELVIS W CONTRAST  Result Date: 11/11/2023 CLINICAL DATA:  Acute nonlocalized upper abdominal pain. History of gastric ulcers. Nausea, vomiting, and diarrhea. EXAM: CT ABDOMEN AND PELVIS WITH CONTRAST TECHNIQUE: Multidetector CT imaging of the abdomen and pelvis was performed using the standard protocol following bolus administration of intravenous contrast. RADIATION DOSE REDUCTION: This exam was performed according to the departmental dose-optimization program which includes automated exposure control, adjustment of the mA and/or kV according to patient size and/or use of iterative reconstruction technique. CONTRAST:  80mL OMNIPAQUE IOHEXOL 300 MG/ML  SOLN COMPARISON:  CT 10/25/2023.  MRI 09/24/2023 FINDINGS: Lower chest: Lung bases are clear. Hepatobiliary: Mild diffuse fatty infiltration of the liver. Gallbladder and bile  ducts are normal. Pancreas: Unremarkable. No pancreatic ductal dilatation or surrounding inflammatory changes. Spleen: Normal in size without focal abnormality. Adrenals/Urinary Tract: Adrenal glands are unremarkable. Kidneys are normal, without renal calculi, focal lesion, or hydronephrosis. Bladder is unremarkable. Stomach/Bowel: The stomach is fluid distended. This may be physiologic or could indicate dysmotility. There is evidence of wall thickening and edema around the duodenal bulb and pyloric region of the stomach. This is compatible with history of peptic ulcer disease. No evidence of perforation. Small bowel and colon are otherwise unremarkable. Appendix is normal. Vascular/Lymphatic: Aortic atherosclerosis. No enlarged abdominal or pelvic lymph nodes. Reproductive: Status post hysterectomy. No adnexal masses. Other: No abdominal wall hernia or abnormality. No abdominopelvic ascites. Musculoskeletal: No acute or significant osseous findings.  IMPRESSION: 1. Wall thickening and edema in the pylorus and duodenal bulb region consistent with history of peptic ulcer disease. No evidence of perforation. 2. Fatty infiltration of the liver. 3. Aortic atherosclerosis. Electronically Signed   By: Burman Nieves M.D.   On: 11/11/2023 21:10   CT ABDOMEN PELVIS W CONTRAST  Result Date: 10/25/2023 CLINICAL DATA:  Left lower quadrant pain and nausea beginning this morning. EXAM: CT ABDOMEN AND PELVIS WITH CONTRAST TECHNIQUE: Multidetector CT imaging of the abdomen and pelvis was performed using the standard protocol following bolus administration of intravenous contrast. RADIATION DOSE REDUCTION: This exam was performed according to the departmental dose-optimization program which includes automated exposure control, adjustment of the mA and/or kV according to patient size and/or use of iterative reconstruction technique. CONTRAST:  OMNIPAQUE IOHEXOL 300 MG/ML  SOLN COMPARISON:  CT on 09/22/2023 FINDINGS: Lower Chest: No acute findings. Hepatobiliary: No suspicious hepatic masses identified. A focal enhancing nodular density is seen in the gallbladder fundus measuring approximately 1 cm. This remains stable since recent study, and could be due to gallbladder polyp/neoplasm or focal adenomyosis. No No evidence of biliary obstruction. Pancreas:  No mass or inflammatory changes. Spleen: Within normal limits in size and appearance. Adrenals/Urinary Tract: No suspicious masses identified. No evidence of ureteral calculi or hydronephrosis. Stomach/Bowel: No evidence of obstruction, inflammatory process or abnormal fluid collections. Normal appendix visualized. Diffuse colonic diverticulosis is again seen, without signs of diverticulitis. Vascular/Lymphatic: No pathologically enlarged lymph nodes. No acute vascular findings. Reproductive: Prior hysterectomy noted. Adnexal regions are unremarkable in appearance. Other:  Stable small umbilical hernia, which  contains only fat. Musculoskeletal:  No suspicious bone lesions identified. IMPRESSION: No acute findings. Colonic diverticulosis, without radiographic evidence of diverticulitis. Stable 1 cm enhancing nodular density in gallbladder fundus, which could be due to gallbladder polyp/neoplasm or focal adenomyomatosis. Recommend abdomen ultrasound for further evaluation. Stable small umbilical hernia, which contains only fat. Electronically Signed   By: Danae Orleans M.D.   On: 10/25/2023 16:17    Microbiology: Results for orders placed or performed during the hospital encounter of 11/11/23  Resp panel by RT-PCR (RSV, Flu A&B, Covid) Anterior Nasal Swab     Status: None   Collection Time: 11/12/23  1:56 AM   Specimen: Anterior Nasal Swab  Result Value Ref Range Status   SARS Coronavirus 2 by RT PCR NEGATIVE NEGATIVE Final    Comment: (NOTE) SARS-CoV-2 target nucleic acids are NOT DETECTED.  The SARS-CoV-2 RNA is generally detectable in upper respiratory specimens during the acute phase of infection. The lowest concentration of SARS-CoV-2 viral copies this assay can detect is 138 copies/mL. A negative result does not preclude SARS-Cov-2 infection and should not be used as the sole basis for treatment or  other patient management decisions. A negative result may occur with  improper specimen collection/handling, submission of specimen other than nasopharyngeal swab, presence of viral mutation(s) within the areas targeted by this assay, and inadequate number of viral copies(<138 copies/mL). A negative result must be combined with clinical observations, patient history, and epidemiological information. The expected result is Negative.  Fact Sheet for Patients:  BloggerCourse.com  Fact Sheet for Healthcare Providers:  SeriousBroker.it  This test is no t yet approved or cleared by the [place] FDA and  has been authorized for detection and/or  diagnosis of SARS-CoV-2 by FDA under an Emergency Use Authorization (EUA). This EUA will remain  in effect (meaning this test can be used) for the duration of the COVID-19 declaration under Section 564(b)(1) of the Act, 21 U.S.C.section 360bbb-3(b)(1), unless the authorization is terminated  or revoked sooner.       Influenza A by PCR NEGATIVE NEGATIVE Final   Influenza B by PCR NEGATIVE NEGATIVE Final    Comment: (NOTE) The Xpert Xpress SARS-CoV-2/FLU/RSV plus assay is intended as an aid in the diagnosis of influenza from Nasopharyngeal swab specimens and should not be used as a sole basis for treatment. Nasal washings and aspirates are unacceptable for Xpert Xpress SARS-CoV-2/FLU/RSV testing.  Fact Sheet for Patients: BloggerCourse.com  Fact Sheet for Healthcare Providers: SeriousBroker.it  This test is not yet approved or cleared by the [place] FDA and has been authorized for detection and/or diagnosis of SARS-CoV-2 by FDA under an Emergency Use Authorization (EUA). This EUA will remain in effect (meaning this test can be used) for the duration of the COVID-19 declaration under Section 564(b)(1) of the Act, 21 U.S.C. section 360bbb-3(b)(1), unless the authorization is terminated or revoked.     Resp Syncytial Virus by PCR NEGATIVE NEGATIVE Final    Comment: (NOTE) Fact Sheet for Patients: BloggerCourse.com  Fact Sheet for Healthcare Providers: SeriousBroker.it  This test is not yet approved or cleared by the [place] FDA and has been authorized for detection and/or diagnosis of SARS-CoV-2 by FDA under an Emergency Use Authorization (EUA). This EUA will remain in effect (meaning this test can be used) for the duration of the COVID-19 declaration under Section 564(b)(1) of the Act, 21 U.S.C. section 360bbb-3(b)(1), unless the authorization is terminated  or revoked.  Performed at [facility], [address], [place], [place] [address]     Labs: CBC: Recent Labs  Lab 11/11/23 1640 11/12/23 0510  WBC 12.8* 10.0  NEUTROABS 11.3*  --   HGB 14.8 13.4  HCT 42.8 39.6  MCV 85.1 85.7  PLT 379 309   Basic Metabolic Panel: Recent Labs  Lab 11/11/23 1640 11/12/23 0510  NA 130* 130*  K 3.2* 3.6  CL 90* 94*  CO2 28 29  GLUCOSE 486* 156*  BUN 19 16  CREATININE 0.79 0.66  CALCIUM 9.2 8.5*   Liver Function Tests: Recent Labs  Lab 11/11/23 1640  AST 15  ALT 17  ALKPHOS 80  BILITOT 0.8  PROT 8.2*  ALBUMIN 4.6   CBG: Recent Labs  Lab 11/11/23 1516 11/11/23 2327 11/12/23 0757 11/12/23 1201  GLUCAP 481* 254* 206* 243*    Discharge time spent: approximately 35 minutes spent on discharge counseling, evaluation of patient on day of discharge, and coordination of discharge planning with nursing, social work, pharmacy and case management  Signed: Alberteen Sam, MD [facility] 11/12/2023

## 2023-11-12 NOTE — Assessment & Plan Note (Signed)
-  controlled. Not on anticoagulation or rate-controlled.

## 2023-11-13 ENCOUNTER — Emergency Department (HOSPITAL_COMMUNITY)
Admission: EM | Admit: 2023-11-13 | Discharge: 2023-11-13 | Disposition: A | Discharge status: Home / Self Care | Payer: BC Managed Care – PPO | Source: Home / Self Care | Attending: Emergency Medicine | Admitting: Emergency Medicine

## 2023-11-13 DIAGNOSIS — I110 Hypertensive heart disease with heart failure: Secondary | ICD-10-CM | POA: Insufficient documentation

## 2023-11-13 DIAGNOSIS — Z79899 Other long term (current) drug therapy: Secondary | ICD-10-CM | POA: Insufficient documentation

## 2023-11-13 DIAGNOSIS — R339 Retention of urine, unspecified: Secondary | ICD-10-CM | POA: Insufficient documentation

## 2023-11-13 DIAGNOSIS — E039 Hypothyroidism, unspecified: Secondary | ICD-10-CM | POA: Insufficient documentation

## 2023-11-13 DIAGNOSIS — I509 Heart failure, unspecified: Secondary | ICD-10-CM | POA: Insufficient documentation

## 2023-11-13 DIAGNOSIS — Z7982 Long term (current) use of aspirin: Secondary | ICD-10-CM | POA: Insufficient documentation

## 2023-11-13 DIAGNOSIS — E119 Type 2 diabetes mellitus without complications: Secondary | ICD-10-CM | POA: Insufficient documentation

## 2023-11-13 LAB — URINALYSIS, ROUTINE W REFLEX MICROSCOPIC
Bilirubin Urine: NEGATIVE
Glucose, UA: >=500 mg/dL — AB
Hgb urine dipstick: NEGATIVE
Ketones, ur: 20 mg/dL — AB
Leukocytes,Ua: NEGATIVE
Nitrite: NEGATIVE
Protein, ur: NEGATIVE mg/dL
Specific Gravity, Urine: 1.021 (ref 1.005–1.030)
pH: 6 (ref 5.0–8.0)

## 2023-11-13 NOTE — ED Notes (Signed)
Took out cath..waiting on patient to urinate.

## 2023-11-13 NOTE — ED Triage Notes (Signed)
Pt BIBA from home for urinary retention x8hrs. Hx of same. Reports taking flomax at home. General abd pain and bladder spasms. 22ga LH, fentanyl, 4mg  zofran  140/100 HR 100 98% RA CBG 350

## 2023-11-13 NOTE — ED Notes (Signed)
Patient urinated with no pain or problem

## 2023-11-13 NOTE — Discharge Instructions (Signed)
Please follow-up with your urologist or the 1 I have attached your for you today regards recent ER visit.  Today you are treated for urinary retention and your urine does not show any signs of UTI however will be sent off to be cultured.  Please monitor your symptoms and symptoms change or worsen please return to the ER.

## 2023-11-13 NOTE — ED Provider Notes (Signed)
[place] EMERGENCY DEPARTMENT AT [facility] Provider Note   CSN: [id number] Arrival date & time: 11/13/23  4098     History  Chief Complaint  Patient presents with   Urinary Retention    [name] [name] is a 62 y.o. female history of urinary retention, DKA, type 2 diabetes, GERD, hypertension, hypothyroidism, IBS, paroxysmal A-fib, NSTEMI, CHF presented for urinary retention over the past 8 hours.  Patient states she has not been able to urinate and is concerned that she is having urinary retention again.  Patient states that she is not having any nausea vomiting chest pain, fevers.  Patient states that she has suprapubic tenderness but otherwise no abdominal pain.  Patient recently admitted and discharged for gastroparesis exacerbation.    Home Medications Prior to Admission medications   Medication Sig Start Date End Date Taking? Authorizing Provider  aspirin EC 81 MG tablet Take 81 mg by mouth daily. Swallow whole.    [provider]  azelastine (ASTELIN) 0.1 % nasal spray Place 1 spray into both nostrils 2 (two) times daily. 11/04/23   [provider]  Continuous Blood Gluc Sensor (DEXCOM G6 SENSOR) MISC INJECT 1 SENSOR INTO SKIN EVERY 10 DAYS Patient taking differently: Inject 1 Device into the skin See admin instructions. Place 1 new device into the skin every 10 days 07/18/22   Excell Seltzer, MD  Continuous Blood Gluc Transmit (DEXCOM G6 TRANSMITTER) MISC USE AS DIRECTED FOR CONTINUOUS GLUCOSE MONITORING. REUSE TRANSMITTER X 90DAYS THEN DISCARD & REPLACE 07/05/21   Bedsole, Amy E, MD  escitalopram (LEXAPRO) 10 MG tablet Take 10 mg by mouth daily. 09/09/23   [provider]  Glucagon, rDNA, (GLUCAGON EMERGENCY) 1 MG KIT Inject 1 mg into the skin See admin instructions. Inject one mg into the skin see administration instructions. Follow package directions for low blood sugar. 06/18/23   [provider]  levothyroxine (SYNTHROID) 137 MCG  tablet Take 137 mcg by mouth daily before breakfast. 03/19/22   [provider]  metoCLOPramide (REGLAN) 5 MG tablet Take 1 tablet (5 mg total) by mouth 3 (three) times daily before meals. 11/12/23   Danford, Earl Lites, MD  midodrine (PROAMATINE) 5 MG tablet Take 1 tablet (5 mg total) by mouth 2 (two) times daily with a meal. Hold if your systolic blood pressure  ( top number) is > , please check your blood pressure at home once a day and bring in record for your doctor to review, follow up with your pcp and cardiology regarding your blood pressure medication Patient taking differently: Take 5 mg by mouth daily. 02/06/23   Albertine Grates, MD  NOVOLOG FLEXPEN 100 UNIT/ML FlexPen Inject 4 Units into the skin 4 (four) times daily. Per sliding sclae    [provider]  omega-3 acid ethyl esters (LOVAZA) 1 g capsule Take 2 capsules (2 g total) by mouth 2 (two) times daily. 09/13/23   Alwyn Ren, MD  pantoprazole (PROTONIX) 40 MG tablet Take 1 tablet (40 mg total) by mouth 2 (two) times daily. 10/28/23 11/27/23  Almon Hercules, MD  QUEtiapine (SEROQUEL) 50 MG tablet Take 50 mg by mouth at bedtime. 09/09/23   [provider]  ranolazine (RANEXA) 500 MG 12 hr tablet Take 500 mg by mouth 2 (two) times daily. 07/23/23   [provider]  rosuvastatin (CRESTOR) 10 MG tablet Take 10 mg by mouth daily. 06/17/23   [provider]  sucralfate (CARAFATE) 1 GM/10ML suspension Take 10  mLs (1 g total) by mouth 2 (two) times daily. Do not take any other medication within 2-4 hours 10/28/23   Arnaldo Natal, NP  tamsulosin (FLOMAX) 0.4 MG CAPS capsule Take 1 capsule (0.4 mg total) by mouth daily after supper. 09/04/23   Arnetha Courser, MD  TOUJEO SOLOSTAR 300 UNIT/ML Solostar Pen Inject 25 Units into the skin at bedtime. 10/28/23   Almon Hercules, MD  traZODone (DESYREL) 50 MG tablet Take 50 mg by mouth at bedtime.    [provider]      Allergies     Codeine    Review of Systems   Review of Systems  Physical Exam Updated Vital Signs BP (!) 108/95 (BP Location: Right Arm)   Pulse 91   Temp 98.1 F (36.7 C) (Oral)   Resp 18   LMP 08/11/2012   SpO2 100%  Physical Exam Constitutional:      General: She is not in acute distress. Cardiovascular:     Rate and Rhythm: Normal rate and regular rhythm.     Pulses: Normal pulses.     Heart sounds: Normal heart sounds.  Pulmonary:     Effort: Pulmonary effort is normal. No respiratory distress.     Breath sounds: Normal breath sounds.  Abdominal:     General: There is no distension.     Palpations: Abdomen is soft.     Tenderness: There is abdominal tenderness (Suprapubic). There is no guarding or rebound.  Skin:    General: Skin is warm and dry.     Capillary Refill: Capillary refill takes less than 2 seconds.  Neurological:     Mental Status: She is alert and oriented to person, place, and time.  Psychiatric:        Mood and Affect: Mood normal.     ED Results / Procedures / Treatments   Labs (all labs ordered are listed, but only abnormal results are displayed) Labs Reviewed  URINALYSIS, ROUTINE W REFLEX MICROSCOPIC    EKG None  Radiology CT ABDOMEN PELVIS W CONTRAST  Result Date: 11/11/2023 CLINICAL DATA:  Acute nonlocalized upper abdominal pain. History of gastric ulcers. Nausea, vomiting, and diarrhea. EXAM: CT ABDOMEN AND PELVIS WITH CONTRAST TECHNIQUE: Multidetector CT imaging of the abdomen and pelvis was performed using the standard protocol following bolus administration of intravenous contrast. RADIATION DOSE REDUCTION: This exam was performed according to the departmental dose-optimization program which includes automated exposure control, adjustment of the mA and/or kV according to patient size and/or use of iterative reconstruction technique. CONTRAST:  80mL OMNIPAQUE IOHEXOL 300 MG/ML  SOLN COMPARISON:  CT 10/25/2023.  MRI 09/24/2023 FINDINGS: Lower chest:  Lung bases are clear. Hepatobiliary: Mild diffuse fatty infiltration of the liver. Gallbladder and bile ducts are normal. Pancreas: Unremarkable. No pancreatic ductal dilatation or surrounding inflammatory changes. Spleen: Normal in size without focal abnormality. Adrenals/Urinary Tract: Adrenal glands are unremarkable. Kidneys are normal, without renal calculi, focal lesion, or hydronephrosis. Bladder is unremarkable. Stomach/Bowel: The stomach is fluid distended. This may be physiologic or could indicate dysmotility. There is evidence of wall thickening and edema around the duodenal bulb and pyloric region of the stomach. This is compatible with history of peptic ulcer disease. No evidence of perforation. Small bowel and colon are otherwise unremarkable. Appendix is normal. Vascular/Lymphatic: Aortic atherosclerosis. No enlarged abdominal or pelvic lymph nodes. Reproductive: Status post hysterectomy. No adnexal masses. Other: No abdominal wall hernia or abnormality. No abdominopelvic ascites. Musculoskeletal: No acute or significant osseous findings. IMPRESSION: 1.  Wall thickening and edema in the pylorus and duodenal bulb region consistent with history of peptic ulcer disease. No evidence of perforation. 2. Fatty infiltration of the liver. 3. Aortic atherosclerosis. Electronically Signed   By: Burman Nieves M.D.   On: 11/11/2023 21:10    Procedures Ultrasound ED Abd  Date/Time: 11/13/2023 8:03 AM  Performed by: Netta Corrigan, PA-C Authorized by: Netta Corrigan, PA-C   Procedure details:    Indications: abdominal pain     Scope of abdominal ultrasound: Urinary retention.   Bladder:  Visualized    Images: archived    Bladder findings:    Free pelvic fluid: not identified     Volume:  188 cc Comments:     Assessed for urinary retention as normal bladder scan machine was not working     Medications Ordered in ED Medications - No data to display  ED Course/ Medical Decision Making/  A&P                                 Medical Decision Making Amount and/or Complexity of Data Reviewed Labs: ordered.   [name] [name] 62 y.o. presented today for urinary retention. Working DDx that I considered at this time includes, but not limited to, benign urinary retention, BPH, bladder neoplasm, foreign body, cursory uterus, neurogenic pathology, urethral/bladder injury, spinal cord injury, fecal impaction, medication induced, cystitis.  R/o DDx: BPH, bladder neoplasm, foreign body, cursory uterus, neurogenic pathology, urethral/bladder injury, spinal cord injury, fecal impaction, medication induced, cystitis: These are considered less likely due to history of present illness, physical exam, labs/imaging findings  Review of prior external notes: 11/12/2023 discharge summary  Unique Tests and My Interpretation:  Bladder scan: 188 cc UA: Rare bacteria, epithelial cells present Urine culture: Pending  Social Determinants of Health: none  Discussion with Independent Historian: None  Discussion of Management of Tests: None  Risk: Low: based on diagnostic testing/clinical impression and treatment plan  Risk Stratification Score: none  Staffed with Eloise Harman, MD  Plan: On exam patient was no acute distress with stable vitals.  On exam patient is a superior tenderness however was not endorsing any systemic infections or symptoms and so I do suspect this could be related to urinary retention.  Patient states that his only been 8 hours since she last urinated however states that she cannot void and so we will get a bladder scan and if there does appear to be urinary retention will place Foley catheter and send urine to rule out UTI.  If catheter is placed do anticipate urology follow-up.  Bedside bladder scan was done with the ultrasound machine which shows 188 mL.  Since patient is unable to void we will place Foley catheter and send urine for UA anticipate discharge with urology  follow-up.  Attending went to evaluate the patient and patient is out of Foley catheter and so this was replaced.  I spoke to the patient about how if she cannot void here this will most likely lead to UTIs, other complications that will require her to come back to the ER and patient verbalized understanding acceptance of this.  This conversation was witnessed by the nurse.  UA does show epithelial cells with rare bacteria and so we will send for urine.  Due to the epithelial cells will not treat UTI at this time as I suspect this is causing the right bacteria.  Patient was able to void  without difficulty and so we will discharge and have her follow-up with urology.  Patient was given return precautions. Patient stable for discharge at this time.  Patient verbalized understanding of plan.  This chart was dictated using voice recognition software.  Despite best efforts to proofread,  errors can occur which can change the documentation meaning.         Final Clinical Impression(s) / ED Diagnoses Final diagnoses:  None    Rx / DC Orders ED Discharge Orders     None         Remi Deter 11/13/23 1129    Rondel Baton, MD 11/16/23 [phone]

## 2023-11-14 ENCOUNTER — Encounter (HOSPITAL_COMMUNITY): Payer: Self-pay

## 2023-11-14 ENCOUNTER — Inpatient Hospital Stay (HOSPITAL_COMMUNITY)
Admission: EM | Admit: 2023-11-14 | Discharge: 2023-11-18 | DRG: 439 | Disposition: A | Discharge status: Home / Self Care | Payer: BC Managed Care – PPO | Attending: Internal Medicine | Admitting: Internal Medicine

## 2023-11-14 ENCOUNTER — Other Ambulatory Visit: Payer: Self-pay

## 2023-11-14 DIAGNOSIS — Z90710 Acquired absence of both cervix and uterus: Secondary | ICD-10-CM

## 2023-11-14 DIAGNOSIS — Z833 Family history of diabetes mellitus: Secondary | ICD-10-CM

## 2023-11-14 DIAGNOSIS — K2970 Gastritis, unspecified, without bleeding: Secondary | ICD-10-CM | POA: Diagnosis present

## 2023-11-14 DIAGNOSIS — Z87891 Personal history of nicotine dependence: Secondary | ICD-10-CM

## 2023-11-14 DIAGNOSIS — K219 Gastro-esophageal reflux disease without esophagitis: Secondary | ICD-10-CM | POA: Diagnosis present

## 2023-11-14 DIAGNOSIS — Z8679 Personal history of other diseases of the circulatory system: Secondary | ICD-10-CM

## 2023-11-14 DIAGNOSIS — I252 Old myocardial infarction: Secondary | ICD-10-CM

## 2023-11-14 DIAGNOSIS — Z8711 Personal history of peptic ulcer disease: Secondary | ICD-10-CM

## 2023-11-14 DIAGNOSIS — E1043 Type 1 diabetes mellitus with diabetic autonomic (poly)neuropathy: Secondary | ICD-10-CM | POA: Diagnosis present

## 2023-11-14 DIAGNOSIS — K269 Duodenal ulcer, unspecified as acute or chronic, without hemorrhage or perforation: Secondary | ICD-10-CM

## 2023-11-14 DIAGNOSIS — K209 Esophagitis, unspecified without bleeding: Secondary | ICD-10-CM

## 2023-11-14 DIAGNOSIS — K859 Acute pancreatitis without necrosis or infection, unspecified: Principal | ICD-10-CM | POA: Diagnosis present

## 2023-11-14 DIAGNOSIS — Z8249 Family history of ischemic heart disease and other diseases of the circulatory system: Secondary | ICD-10-CM

## 2023-11-14 DIAGNOSIS — N179 Acute kidney failure, unspecified: Secondary | ICD-10-CM | POA: Diagnosis present

## 2023-11-14 DIAGNOSIS — E119 Type 2 diabetes mellitus without complications: Secondary | ICD-10-CM

## 2023-11-14 DIAGNOSIS — E876 Hypokalemia: Secondary | ICD-10-CM | POA: Diagnosis present

## 2023-11-14 DIAGNOSIS — E1022 Type 1 diabetes mellitus with diabetic chronic kidney disease: Secondary | ICD-10-CM | POA: Diagnosis present

## 2023-11-14 DIAGNOSIS — E039 Hypothyroidism, unspecified: Secondary | ICD-10-CM | POA: Diagnosis not present

## 2023-11-14 DIAGNOSIS — M810 Age-related osteoporosis without current pathological fracture: Secondary | ICD-10-CM | POA: Diagnosis present

## 2023-11-14 DIAGNOSIS — K2980 Duodenitis without bleeding: Secondary | ICD-10-CM | POA: Diagnosis not present

## 2023-11-14 DIAGNOSIS — E860 Dehydration: Secondary | ICD-10-CM | POA: Diagnosis present

## 2023-11-14 DIAGNOSIS — Z91128 Patient's intentional underdosing of medication regimen for other reason: Secondary | ICD-10-CM

## 2023-11-14 DIAGNOSIS — T383X6A Underdosing of insulin and oral hypoglycemic [antidiabetic] drugs, initial encounter: Secondary | ICD-10-CM | POA: Diagnosis present

## 2023-11-14 DIAGNOSIS — Z7982 Long term (current) use of aspirin: Secondary | ICD-10-CM

## 2023-11-14 DIAGNOSIS — N182 Chronic kidney disease, stage 2 (mild): Secondary | ICD-10-CM | POA: Diagnosis present

## 2023-11-14 DIAGNOSIS — Z794 Long term (current) use of insulin: Secondary | ICD-10-CM

## 2023-11-14 DIAGNOSIS — E1065 Type 1 diabetes mellitus with hyperglycemia: Secondary | ICD-10-CM | POA: Diagnosis present

## 2023-11-14 DIAGNOSIS — E785 Hyperlipidemia, unspecified: Secondary | ICD-10-CM | POA: Diagnosis present

## 2023-11-14 DIAGNOSIS — Z885 Allergy status to narcotic agent status: Secondary | ICD-10-CM

## 2023-11-14 DIAGNOSIS — R112 Nausea with vomiting, unspecified: Secondary | ICD-10-CM

## 2023-11-14 DIAGNOSIS — K3184 Gastroparesis: Secondary | ICD-10-CM | POA: Diagnosis present

## 2023-11-14 DIAGNOSIS — E872 Acidosis, unspecified: Secondary | ICD-10-CM | POA: Diagnosis present

## 2023-11-14 DIAGNOSIS — Z8719 Personal history of other diseases of the digestive system: Secondary | ICD-10-CM

## 2023-11-14 DIAGNOSIS — Z820 Family history of epilepsy and other diseases of the nervous system: Secondary | ICD-10-CM

## 2023-11-14 DIAGNOSIS — I129 Hypertensive chronic kidney disease with stage 1 through stage 4 chronic kidney disease, or unspecified chronic kidney disease: Secondary | ICD-10-CM | POA: Diagnosis present

## 2023-11-14 DIAGNOSIS — I10 Essential (primary) hypertension: Secondary | ICD-10-CM | POA: Diagnosis present

## 2023-11-14 DIAGNOSIS — F32A Depression, unspecified: Secondary | ICD-10-CM | POA: Diagnosis present

## 2023-11-14 DIAGNOSIS — Z79899 Other long term (current) drug therapy: Secondary | ICD-10-CM

## 2023-11-14 DIAGNOSIS — Z79890 Hormone replacement therapy (postmenopausal): Secondary | ICD-10-CM

## 2023-11-14 LAB — I-STAT VENOUS BLOOD GAS, ED
Acid-Base Excess: 9 mmol/L — ABNORMAL HIGH (ref 0.0–2.0)
Bicarbonate: 31.8 mmol/L — ABNORMAL HIGH (ref 20.0–28.0)
Calcium, Ion: 1 mmol/L — ABNORMAL LOW (ref 1.15–1.40)
HCT: 46 % (ref 36.0–46.0)
Hemoglobin: 15.6 g/dL — ABNORMAL HIGH (ref 12.0–15.0)
O2 Saturation: 95 %
Potassium: 4.9 mmol/L (ref 3.5–5.1)
Sodium: 131 mmol/L — ABNORMAL LOW (ref 135–145)
TCO2: 33 mmol/L — ABNORMAL HIGH (ref 22–32)
pCO2, Ven: 37.7 mm[Hg] — ABNORMAL LOW (ref 44–60)
pH, Ven: 7.533 — ABNORMAL HIGH (ref 7.25–7.43)
pO2, Ven: 67 mm[Hg] — ABNORMAL HIGH (ref 32–45)

## 2023-11-14 LAB — BASIC METABOLIC PANEL
Anion gap: 10 (ref 5–15)
BUN: 14 mg/dL (ref 6–20)
CO2: 23 mmol/L (ref 22–32)
Calcium: 8.2 mg/dL — ABNORMAL LOW (ref 8.9–10.3)
Chloride: 106 mmol/L (ref 98–111)
Creatinine, Ser: 0.65 mg/dL (ref 0.44–1.00)
GFR, Estimated: >60 mL/min (ref >60)
Glucose, Bld: 91 mg/dL (ref 70–99)
Potassium: 3.2 mmol/L — ABNORMAL LOW (ref 3.5–5.1)
Sodium: 139 mmol/L (ref 135–145)

## 2023-11-14 LAB — URINALYSIS, ROUTINE W REFLEX MICROSCOPIC
Bacteria, UA: NONE SEEN
Bilirubin Urine: NEGATIVE
Glucose, UA: >=500 mg/dL — AB
Hgb urine dipstick: NEGATIVE
Ketones, ur: 5 mg/dL — AB
Leukocytes,Ua: NEGATIVE
Nitrite: NEGATIVE
Protein, ur: NEGATIVE mg/dL
Specific Gravity, Urine: 1.007 (ref 1.005–1.030)
pH: 7 (ref 5.0–8.0)

## 2023-11-14 LAB — CBC WITH DIFFERENTIAL/PLATELET
Abs Immature Granulocytes: 0.06 10*3/uL (ref 0.00–0.07)
Basophils Absolute: 0.1 10*3/uL (ref 0.0–0.1)
Basophils Relative: 1 %
Eosinophils Absolute: 0.1 10*3/uL (ref 0.0–0.5)
Eosinophils Relative: 1 %
HCT: 46.1 % — ABNORMAL HIGH (ref 36.0–46.0)
Hemoglobin: 14.9 g/dL (ref 12.0–15.0)
Immature Granulocytes: 1 %
Lymphocytes Relative: 22 %
Lymphs Abs: 2.1 10*3/uL (ref 0.7–4.0)
MCH: 27.9 pg (ref 26.0–34.0)
MCHC: 32.3 g/dL (ref 30.0–36.0)
MCV: 86.3 fL (ref 80.0–100.0)
Monocytes Absolute: 0.5 10*3/uL (ref 0.1–1.0)
Monocytes Relative: 6 %
Neutro Abs: 6.6 10*3/uL (ref 1.7–7.7)
Neutrophils Relative %: 69 %
Platelets: 377 10*3/uL (ref 150–400)
RBC: 5.34 MIL/uL — ABNORMAL HIGH (ref 3.87–5.11)
RDW: 14.7 % (ref 11.5–15.5)
WBC: 9.5 10*3/uL (ref 4.0–10.5)
nRBC: 0 % (ref 0.0–0.2)

## 2023-11-14 LAB — COMPREHENSIVE METABOLIC PANEL
ALT: 12 U/L (ref 0–44)
AST: 20 U/L (ref 15–41)
Albumin: 3.9 g/dL (ref 3.5–5.0)
Alkaline Phosphatase: 69 U/L (ref 38–126)
Anion gap: 16 — ABNORMAL HIGH (ref 5–15)
BUN: 25 mg/dL — ABNORMAL HIGH (ref 6–20)
CO2: 24 mmol/L (ref 22–32)
Calcium: 9.6 mg/dL (ref 8.9–10.3)
Chloride: 89 mmol/L — ABNORMAL LOW (ref 98–111)
Creatinine, Ser: 1.22 mg/dL — ABNORMAL HIGH (ref 0.44–1.00)
GFR, Estimated: 51 mL/min — ABNORMAL LOW (ref >60)
Glucose, Bld: 326 mg/dL — ABNORMAL HIGH (ref 70–99)
Potassium: 4.2 mmol/L (ref 3.5–5.1)
Sodium: 129 mmol/L — ABNORMAL LOW (ref 135–145)
Total Bilirubin: 1 mg/dL (ref <1.2)
Total Protein: 7.1 g/dL (ref 6.5–8.1)

## 2023-11-14 LAB — LIPID PANEL
Cholesterol: 219 mg/dL — ABNORMAL HIGH (ref 0–200)
HDL: 59 mg/dL (ref >40)
LDL Cholesterol: 140 mg/dL — ABNORMAL HIGH (ref 0–99)
Total CHOL/HDL Ratio: 3.7 {ratio}
Triglycerides: 98 mg/dL (ref <150)
VLDL: 20 mg/dL (ref 0–40)

## 2023-11-14 LAB — GLUCOSE, CAPILLARY
Glucose-Capillary: 93 mg/dL (ref 70–99)
Glucose-Capillary: 125 mg/dL — ABNORMAL HIGH (ref 70–99)

## 2023-11-14 LAB — URINE CULTURE: Culture: NO GROWTH

## 2023-11-14 LAB — ETHANOL: Alcohol, Ethyl (B): <10 mg/dL (ref <10)

## 2023-11-14 LAB — LACTATE DEHYDROGENASE: LDH: 122 U/L (ref 98–192)

## 2023-11-14 LAB — CBG MONITORING, ED: Glucose-Capillary: 235 mg/dL — ABNORMAL HIGH (ref 70–99)

## 2023-11-14 LAB — LIPASE, BLOOD: Lipase: 207 U/L — ABNORMAL HIGH (ref 11–51)

## 2023-11-14 MED ORDER — METOCLOPRAMIDE HCL 5 MG/ML IJ SOLN
5.0000 mg | Freq: Four times a day (QID) | INTRAMUSCULAR | Status: DC
  Administered 2023-11-14 – 2023-11-17 (×10): 5 mg via INTRAVENOUS
  Filled 2023-11-14 (×10): qty 2

## 2023-11-14 MED ORDER — DEXTROSE 50 % IV SOLN: 12.5000 g | Freq: Once | INTRAVENOUS | Status: DC | PRN

## 2023-11-14 MED ORDER — MORPHINE SULFATE (PF) 2 MG/ML IV SOLN
2.0000 mg | INTRAVENOUS | Status: DC | PRN
  Administered 2023-11-14 (×2): 2 mg via INTRAVENOUS
  Filled 2023-11-14 (×2): qty 1

## 2023-11-14 MED ORDER — HYDROMORPHONE HCL 1 MG/ML IJ SOLN
1.0000 mg | INTRAMUSCULAR | Status: DC | PRN
  Administered 2023-11-14 – 2023-11-18 (×12): 1 mg via INTRAVENOUS
  Filled 2023-11-14 (×12): qty 1

## 2023-11-14 MED ORDER — INSULIN ASPART 100 UNIT/ML IJ SOLN
0.0000 [IU] | INTRAMUSCULAR | Status: DC
  Administered 2023-11-14: 2 [IU] via SUBCUTANEOUS
  Administered 2023-11-14: 5 [IU] via SUBCUTANEOUS
  Administered 2023-11-15 – 2023-11-16 (×4): 3 [IU] via SUBCUTANEOUS

## 2023-11-14 MED ORDER — SUCRALFATE 1 GM/10ML PO SUSP
1.0000 g | Freq: Once | ORAL | Status: AC
  Administered 2023-11-14: 1 g via ORAL
  Filled 2023-11-14: qty 10

## 2023-11-14 MED ORDER — INSULIN GLARGINE-YFGN 100 UNIT/ML ~~LOC~~ SOLN: 12.0000 [IU] | Freq: Every day | SUBCUTANEOUS | Status: DC

## 2023-11-14 MED ORDER — PANTOPRAZOLE SODIUM 40 MG IV SOLR
40.0000 mg | Freq: Two times a day (BID) | INTRAVENOUS | Status: DC
  Administered 2023-11-14 – 2023-11-16 (×5): 40 mg via INTRAVENOUS
  Filled 2023-11-14 (×5): qty 10

## 2023-11-14 MED ORDER — LACTATED RINGERS IV BOLUS: 1000.0000 mL | Freq: Once | INTRAVENOUS | Status: DC

## 2023-11-14 MED ORDER — ONDANSETRON HCL 4 MG PO TABS: 4.0000 mg | ORAL_TABLET | Freq: Four times a day (QID) | ORAL | Status: DC | PRN

## 2023-11-14 MED ORDER — ENOXAPARIN SODIUM 40 MG/0.4ML IJ SOSY
40.0000 mg | PREFILLED_SYRINGE | INTRAMUSCULAR | Status: DC
  Administered 2023-11-14 – 2023-11-17 (×4): 40 mg via SUBCUTANEOUS
  Filled 2023-11-14 (×4): qty 0.4

## 2023-11-14 MED ORDER — ONDANSETRON HCL 4 MG/2ML IJ SOLN
4.0000 mg | Freq: Four times a day (QID) | INTRAMUSCULAR | Status: DC | PRN
  Administered 2023-11-14: 4 mg via INTRAVENOUS
  Filled 2023-11-14: qty 2

## 2023-11-14 MED ORDER — INSULIN GLARGINE-YFGN 100 UNIT/ML ~~LOC~~ SOLN
9.0000 [IU] | Freq: Every day | SUBCUTANEOUS | Status: DC
Start: 2023-11-14 — End: 2023-11-15
  Administered 2023-11-14: 9 [IU] via SUBCUTANEOUS
  Filled 2023-11-14 (×2): qty 0.09

## 2023-11-14 MED ORDER — SODIUM CHLORIDE 0.9 % IV BOLUS
1000.0000 mL | Freq: Once | INTRAVENOUS | Status: AC
  Administered 2023-11-14: 1000 mL via INTRAVENOUS

## 2023-11-14 MED ORDER — LEVOTHYROXINE SODIUM 25 MCG PO TABS
137.0000 ug | ORAL_TABLET | Freq: Every day | ORAL | Status: DC
  Administered 2023-11-15 – 2023-11-18 (×4): 137 ug via ORAL
  Filled 2023-11-14 (×4): qty 1

## 2023-11-14 MED ORDER — SODIUM CHLORIDE 0.9 % IV BOLUS
1000.0000 mL | Freq: Once | INTRAVENOUS | Status: AC
Start: 2023-11-14 — End: 2023-11-14
  Administered 2023-11-14: 1000 mL via INTRAVENOUS

## 2023-11-14 MED ORDER — ESCITALOPRAM OXALATE 10 MG PO TABS
10.0000 mg | ORAL_TABLET | Freq: Every day | ORAL | Status: DC
  Administered 2023-11-15 – 2023-11-18 (×4): 10 mg via ORAL
  Filled 2023-11-14 (×4): qty 1

## 2023-11-14 MED ORDER — MORPHINE SULFATE (PF) 2 MG/ML IV SOLN: 1.0000 mg | INTRAVENOUS | Status: DC | PRN

## 2023-11-14 MED ORDER — FAMOTIDINE IN NACL 20-0.9 MG/50ML-% IV SOLN
20.0000 mg | Freq: Once | INTRAVENOUS | Status: AC
  Administered 2023-11-14: 20 mg via INTRAVENOUS
  Filled 2023-11-14: qty 50

## 2023-11-14 MED ORDER — SUCRALFATE 1 GM/10ML PO SUSP
1.0000 g | Freq: Two times a day (BID) | ORAL | Status: DC
  Administered 2023-11-14 – 2023-11-18 (×8): 1 g via ORAL
  Filled 2023-11-14 (×8): qty 10

## 2023-11-14 MED ORDER — SODIUM CHLORIDE 0.9 % IV SOLN: INTRAVENOUS | Status: DC

## 2023-11-14 MED ORDER — QUETIAPINE FUMARATE 25 MG PO TABS
50.0000 mg | ORAL_TABLET | Freq: Every day | ORAL | Status: DC
  Administered 2023-11-14 – 2023-11-17 (×4): 50 mg via ORAL
  Filled 2023-11-14 (×4): qty 2

## 2023-11-14 MED ORDER — PROCHLORPERAZINE EDISYLATE 10 MG/2ML IJ SOLN
5.0000 mg | Freq: Four times a day (QID) | INTRAMUSCULAR | Status: DC | PRN
  Administered 2023-11-14: 5 mg via INTRAVENOUS
  Filled 2023-11-14: qty 2

## 2023-11-14 NOTE — ED Notes (Signed)
ED TO INPATIENT HANDOFF REPORT  ED Nurse Name and Phone #: Victorino Dike [phone]  S Name/Age/Gender [name] [name] 62 y.o. female Room/Bed: 039C/039C  Code Status   Code Status: Full Code  Home/SNF/Other Home Patient oriented to: self, place, time, and situation Is this baseline? Yes   Triage Complete: Triage complete  Chief Complaint Acute pancreatitis [K85.90]  Triage Note Pt arrives via ems to the er from home for the c/o chest pain x 5 days, seen at [facility] yesterday and discharged. Chest pain radiates to left arm. Pt also c/o headache, bs with ems is 346, frequent urination per pt.   VSS PER EMS  BP 114/90 108P 98% 02 RA   Allergies Allergies  Allergen Reactions   Codeine Hives, Anxiety and Other (See Comments)    Level of Care/Admitting Diagnosis ED Disposition     ED Disposition  Admit   Condition  --   Comment  Hospital Area: [facility] [[id number]]  Level of Care: Telemetry Medical [104]  May place patient in observation at [facility] or [facility] if equivalent level of care is available:: No  Covid Evaluation: Asymptomatic - no recent exposure (last 10 days) testing not required  Diagnosis: Acute pancreatitis [577.0.ICD-9-CM]  Admitting Physician: Rodolph Bong [[id number]]  Attending Physician: Rodolph Bong [[id number]]          B Medical/Surgery History Past Medical History:  Diagnosis Date   Allergy    seasonal   Anxiety    B12 deficiency    Chest pain 04/05/2017   CHF (congestive heart failure) ([name])    Diabetes mellitus type 1, uncontrolled    "dr. 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) 10/29/2013   Leg cramping    "@ night" (09/18/2013)   Migraine     "once q couple months" (11/26/2018)   Osteoporosis    Peripheral neuropathy    Umbilical hernia    unrepaired (09/18/2013)   Past Surgical History:  Procedure Laterality Date   BIOPSY  06/03/2019   Procedure: BIOPSY;  Surgeon: Lynann Bologna, MD;  Location: [facility];  Service: Endoscopy;;   BIOPSY  08/17/2020   Procedure: BIOPSY;  Surgeon: Tressia Danas, MD;  Location: [facility];  Service: Gastroenterology;;   BIOPSY  02/05/2023   Procedure: BIOPSY;  Surgeon: Meryl Dare, MD;  Location: [facility];  Service: Gastroenterology;;   BIOPSY  09/24/2023   Procedure: BIOPSY;  Surgeon: Lemar Lofty., MD;  Location: [facility];  Service: Gastroenterology;;   CESAREAN SECTION  1989   ENTEROSCOPY N/A 06/03/2019   Procedure: ENTEROSCOPY;  Surgeon: Lynann Bologna, MD;  Location: [facility];  Service: Endoscopy;  Laterality: N/A;   ESOPHAGOGASTRODUODENOSCOPY (EGD) WITH PROPOFOL N/A 08/17/2020   Procedure: ESOPHAGOGASTRODUODENOSCOPY (EGD) WITH PROPOFOL;  Surgeon: Tressia Danas, MD;  Location: [facility];  Service: Gastroenterology;  Laterality: N/A;   ESOPHAGOGASTRODUODENOSCOPY (EGD) WITH PROPOFOL N/A 02/05/2023   Procedure: ESOPHAGOGASTRODUODENOSCOPY (EGD) WITH PROPOFOL;  Surgeon: Meryl Dare, MD;  Location: [facility];  Service: Gastroenterology;  Laterality: N/A;   ESOPHAGOGASTRODUODENOSCOPY (EGD) WITH PROPOFOL N/A 09/24/2023   Procedure: ESOPHAGOGASTRODUODENOSCOPY (EGD) WITH PROPOFOL;  Surgeon: Meridee Score Netty Starring., MD;  Location: [facility];  Service: Gastroenterology;  Laterality: N/A;  EUS N/A 09/24/2023   Procedure: UPPER ENDOSCOPIC ULTRASOUND (EUS) LINEAR;  Surgeon: Lemar Lofty., MD;  Location: [facility];  Service: Gastroenterology;  Laterality: N/A;   LAPAROSCOPIC ASSISTED VAGINAL HYSTERECTOMY  09/23/2012   Procedure: LAPAROSCOPIC ASSISTED VAGINAL HYSTERECTOMY;  Surgeon: Mitchel Honour, DO;  Location: [facility];  Service: Gynecology;   Laterality: N/A;  pull Dr Vance Gather instrument   LEFT HEART CATH AND CORONARY ANGIOGRAPHY N/A 02/11/2017   Procedure: Left Heart Cath and Coronary Angiography;  Surgeon: Runell Gess, MD;  Location: [facility] INVASIVE CV LAB;  Service: Cardiovascular;  Laterality: N/A;   TUBAL LIGATION Bilateral 1992     A IV Location/Drains/Wounds Patient Lines/Drains/Airways Status     Active Line/Drains/Airways     Name Placement date Placement time Site Days   Peripheral IV 11/14/23 20 G Posterior;Right Hand 11/14/23  0822  Hand  less than 1            Intake/Output Last 24 hours  Intake/Output Summary (Last 24 hours) at 11/14/2023 1421 Last data filed at 11/14/2023 1230 Gross per 24 hour  Intake 2050 ml  Output --  Net 2050 ml    Labs/Imaging Results for orders placed or performed during the hospital encounter of 11/14/23 (from the past 48 hour(s))  Ethanol     Status: None   Collection Time: 11/14/23  8:02 AM  Result Value Ref Range   Alcohol, Ethyl (B) <10 <10 mg/dL    Comment: (NOTE) Lowest detectable limit for serum alcohol is 10 mg/dL.  For medical purposes only. Performed at [facility], [address], [place], [place] [address]   Comprehensive metabolic panel     Status: Abnormal   Collection Time: 11/14/23  8:09 AM  Result Value Ref Range   Sodium 129 (L) 135 - 145 mmol/L   Potassium 4.2 3.5 - 5.1 mmol/L    Comment: HEMOLYSIS AT THIS LEVEL MAY AFFECT RESULT   Chloride 89 (L) 98 - 111 mmol/L   CO2 24 22 - 32 mmol/L   Glucose, Bld 326 (H) 70 - 99 mg/dL    Comment: Glucose reference range applies only to samples taken after fasting for at least 8 hours.   BUN 25 (H) 6 - 20 mg/dL   Creatinine, Ser 0.27 (H) 0.44 - 1.00 mg/dL   Calcium 9.6 8.9 - 25.3 mg/dL   Total Protein 7.1 6.5 - 8.1 g/dL   Albumin 3.9 3.5 - 5.0 g/dL   AST 20 15 - 41 U/L    Comment: HEMOLYSIS AT THIS LEVEL MAY AFFECT RESULT   ALT 12 0 - 44 U/L    Comment: HEMOLYSIS AT THIS LEVEL MAY AFFECT RESULT    Alkaline Phosphatase 69 38 - 126 U/L   Total Bilirubin 1.0 <1.2 mg/dL    Comment: HEMOLYSIS AT THIS LEVEL MAY AFFECT RESULT   GFR, Estimated 51 (L) >60 mL/min    Comment: (NOTE) Calculated using the CKD-EPI Creatinine Equation (2021)    Anion gap 16 (H) 5 - 15    Comment: Performed at [facility], [address], [place], [place] [address]  Lipase, blood     Status: Abnormal   Collection Time: 11/14/23  8:09 AM  Result Value Ref Range   Lipase 207 (H) 11 - 51 U/L    Comment: Performed at [facility], [address], [place], [place] [address]  CBC with Differential     Status: Abnormal   Collection Time: 11/14/23  8:09 AM  Result Value Ref Range   WBC 9.5 4.0 - 10.5 K/uL   RBC 5.34 (H) 3.87 - 5.11 MIL/uL   Hemoglobin 14.9 12.0 - 15.0 g/dL   HCT 95.2 (H) 84.1 - 32.4 %   MCV 86.3 80.0 - 100.0 fL   MCH 27.9 26.0 - 34.0 pg   MCHC 32.3 30.0 - 36.0 g/dL   RDW 40.1 02.7 - 25.3 %   Platelets 377 150 - 400 K/uL   nRBC 0.0 0.0 - 0.2 %   Neutrophils Relative % 69 %   Neutro Abs 6.6 1.7 - 7.7 K/uL   Lymphocytes Relative 22 %   Lymphs Abs 2.1 0.7 - 4.0 K/uL   Monocytes Relative 6 %   Monocytes Absolute 0.5 0.1 - 1.0 K/uL   Eosinophils Relative 1 %   Eosinophils Absolute 0.1 0.0 - 0.5 K/uL   Basophils Relative 1 %   Basophils Absolute 0.1 0.0 - 0.1 K/uL   Immature Granulocytes 1 %   Abs Immature Granulocytes 0.06 0.00 - 0.07 K/uL    Comment: Performed at [facility], [address], [place], [place] [address]  I-Stat venous blood gas, ED     Status: Abnormal   Collection Time: 11/14/23  8:26 AM  Result Value Ref Range   pH, Ven 7.533 (H) 7.25 - 7.43   pCO2, Ven 37.7 (L) 44 - 60 mmHg   pO2, Ven 67 (H) 32 - 45 mmHg   Bicarbonate 31.8 (H) 20.0 - 28.0 mmol/L   TCO2 33 (H) 22 - 32 mmol/L   O2 Saturation 95 %   Acid-Base Excess 9.0 (H) 0.0 - 2.0 mmol/L   Sodium 131 (L) 135 - 145 mmol/L   Potassium 4.9 3.5 - 5.1 mmol/L   Calcium, Ion 1.00 (L) 1.15 - 1.40 mmol/L    HCT 46.0 36.0 - 46.0 %   Hemoglobin 15.6 (H) 12.0 - 15.0 g/dL   Sample type VENOUS   Urinalysis, Routine w reflex microscopic -Urine, Clean Catch     Status: Abnormal   Collection Time: 11/14/23 12:09 PM  Result Value Ref Range   Color, Urine STRAW (A) YELLOW   APPearance CLEAR CLEAR   Specific Gravity, Urine 1.007 1.005 - 1.030   pH 7.0 5.0 - 8.0   Glucose, UA >=500 (A) NEGATIVE mg/dL   Hgb urine dipstick NEGATIVE NEGATIVE   Bilirubin Urine NEGATIVE NEGATIVE   Ketones, ur 5 (A) NEGATIVE mg/dL   Protein, ur NEGATIVE NEGATIVE mg/dL   Nitrite NEGATIVE NEGATIVE   Leukocytes,Ua NEGATIVE NEGATIVE   RBC / HPF 0-5 0 - 5 RBC/hpf   WBC, UA 0-5 0 - 5 WBC/hpf   Bacteria, UA NONE SEEN NONE SEEN   Squamous Epithelial / HPF 0-5 0 - 5 /HPF   Mucus PRESENT    Hyaline Casts, UA PRESENT     Comment: Performed at [facility], [address], [place], [place] [address]  CBG monitoring, ED     Status: Abnormal   Collection Time: 11/14/23 12:48 PM  Result Value Ref Range   Glucose-Capillary 235 (H) 70 - 99 mg/dL    Comment: Glucose reference range applies only to samples taken after fasting for at least 8 hours.   No results found.  Pending Labs Unresulted Labs (From admission, onward)     Start     Ordered   11/15/23 0500  Comprehensive metabolic panel  Tomorrow morning,   R        11/14/23 1125  11/15/23 0500  CBC  Tomorrow morning,   R        11/14/23 1125   Pending  Basic metabolic panel  Once-Timed,   R        Pending   Pending  Lipid panel  Once-Timed,   R        Pending   Pending  Lactate dehydrogenase  Once-Timed,   R        Pending   Pending  C4 complement  Once-Timed,   R        Pending            Vitals/Pain Today's Vitals   11/14/23 1000 11/14/23 1129 11/14/23 1246 11/14/23 1400  BP: (!) 140/96   110/87  Pulse: 100   95  Resp: 19   17  Temp:  97.8 F (36.6 C)    TempSrc:      SpO2: 99%   98%  PainSc:   9      Isolation Precautions No active  isolations  Medications Medications  pantoprazole (PROTONIX) injection 40 mg (40 mg Intravenous Given 11/14/23 1125)  insulin aspart (novoLOG) injection 0-15 Units (5 Units Subcutaneous Given 11/14/23 1251)  enoxaparin (LOVENOX) injection 40 mg (40 mg Subcutaneous Given 11/14/23 1246)  morphine (PF) 2 MG/ML injection 1 mg ( Intravenous See Alternative 11/14/23 1245)    Or  morphine (PF) 2 MG/ML injection 2 mg (2 mg Intravenous Given 11/14/23 1245)  ondansetron (ZOFRAN) tablet 4 mg (has no administration in time range)    Or  ondansetron (ZOFRAN) injection 4 mg (has no administration in time range)  sodium chloride 0.9 % bolus 1,000 mL (0 mLs Intravenous Stopped 11/14/23 1044)  famotidine (PEPCID) IVPB 20 mg premix (0 mg Intravenous Stopped 11/14/23 1043)  sucralfate (CARAFATE) 1 GM/10ML suspension 1 g (1 g Oral Given 11/14/23 0845)  sodium chloride 0.9 % bolus 1,000 mL (0 mLs Intravenous Stopped 11/14/23 1230)  sodium chloride 0.9 % bolus 1,000 mL (1,000 mLs Intravenous New Bag/Given 11/14/23 1245)    Mobility walks     Focused Assessments Cardiac Assessment Handoff:    Lab Results  Component Value Date   CKTOTAL 60 09/18/2013   CKMB 2.7 09/18/2013   TROPONINI <0.03 03/25/2019   No results found for: "DDIMER" Does the Patient currently have chest pain? No   , Pulmonary Assessment Handoff:  Lung sounds:          R Recommendations: See Admitting Provider Note  Report given to:   Additional Notes:

## 2023-11-14 NOTE — ED Provider Notes (Signed)
[facility] EMERGENCY DEPARTMENT AT [facility] Provider Note   CSN: [id number] Arrival date & time: 11/14/23  9563     History  Chief Complaint  Patient presents with   Hyperglycemia   Chest Pain    [name] [name] is a 62 y.o. female.  HPI Presents 1 day after being seen in our affiliated facility, now for the 15th time in 6 months with concern of epigastric pain.  There is associated anorexia, nausea, but no vomiting, no diarrhea.  Pain is focally in the epigastrium/xiphoid region.  She notes a history of MI, heart failure, gastroparesis, peptic ulcer disease.    Home Medications Prior to Admission medications   Medication Sig Start Date End Date Taking? Authorizing Provider  aspirin EC 81 MG tablet Take 81 mg by mouth daily. Swallow whole.   Yes [provider]  azelastine (ASTELIN) 0.1 % nasal spray Place 1 spray into both nostrils 2 (two) times daily. 11/04/23  Yes [provider]  Continuous Blood Gluc Sensor (DEXCOM G6 SENSOR) MISC INJECT 1 SENSOR INTO SKIN EVERY 10 DAYS Patient taking differently: Inject 1 Device into the skin See admin instructions. Place 1 new device into the skin every 10 days 07/18/22  Yes Bedsole, Amy E, MD  Continuous Blood Gluc Transmit (DEXCOM G6 TRANSMITTER) MISC USE AS DIRECTED FOR CONTINUOUS GLUCOSE MONITORING. REUSE TRANSMITTER X 90DAYS THEN DISCARD & REPLACE 07/05/21  Yes Bedsole, Amy E, MD  escitalopram (LEXAPRO) 10 MG tablet Take 10 mg by mouth daily. 09/09/23  Yes [provider]  Glucagon, rDNA, (GLUCAGON EMERGENCY) 1 MG KIT Inject 1 mg into the skin See admin instructions. Inject one mg into the skin see administration instructions. Follow package directions for low blood sugar. 06/18/23  Yes [provider]  levothyroxine (SYNTHROID) 137 MCG tablet Take 137 mcg by mouth daily before breakfast. 03/19/22  Yes [provider]  metoCLOPramide (REGLAN) 5 MG tablet Take 1 tablet (5 mg total) by mouth  3 (three) times daily before meals. 11/12/23  Yes Danford, Earl Lites, MD  midodrine (PROAMATINE) 5 MG tablet Take 1 tablet (5 mg total) by mouth 2 (two) times daily with a meal. Hold if your systolic blood pressure  ( top number) is > , please check your blood pressure at home once a day and bring in record for your doctor to review, follow up with your pcp and cardiology regarding your blood pressure medication Patient taking differently: Take 5 mg by mouth daily. 02/06/23  Yes Albertine Grates, MD  NOVOLOG FLEXPEN 100 UNIT/ML FlexPen Inject 4 Units into the skin 4 (four) times daily. Per sliding sclae   Yes [provider]  omega-3 acid ethyl esters (LOVAZA) 1 g capsule Take 2 capsules (2 g total) by mouth 2 (two) times daily. 09/13/23  Yes Alwyn Ren, MD  pantoprazole (PROTONIX) 40 MG tablet Take 1 tablet (40 mg total) by mouth 2 (two) times daily. 10/28/23 11/27/23 Yes Almon Hercules, MD  QUEtiapine (SEROQUEL) 50 MG tablet Take 50 mg by mouth at bedtime. 09/09/23  Yes [provider]  ranolazine (RANEXA) 500 MG 12 hr tablet Take 500 mg by mouth 2 (two) times daily. 07/23/23  Yes [provider]  rosuvastatin (CRESTOR) 10 MG tablet Take 10 mg by mouth daily. 06/17/23  Yes [provider]  sucralfate (CARAFATE) 1 GM/10ML suspension Take 10 mLs (1 g total) by mouth 2 (two) times daily. Do not take any other medication within 2-4 hours 10/28/23  Yes Arnaldo Natal, NP  tamsulosin (FLOMAX) 0.4 MG CAPS capsule Take 1 capsule (0.4 mg total) by mouth daily after supper. 09/04/23  Yes Arnetha Courser, MD  TOUJEO SOLOSTAR 300 UNIT/ML Solostar Pen Inject 25 Units into the skin at bedtime. 10/28/23  Yes Almon Hercules, MD  traZODone (DESYREL) 50 MG tablet Take 50 mg by mouth at bedtime.   Yes [provider]      Allergies    Codeine    Review of Systems   Review of Systems  Physical Exam Updated Vital Signs BP (!) 155/95 (BP Location: Left Arm)    Pulse 95   Temp 98 F (36.7 C) (Oral)   Resp 18   Ht 4\' 11"  (1.499 m)   Wt 57.4 kg   LMP 08/11/2012   SpO2 94%   BMI 25.56 kg/m  Physical Exam Vitals and nursing note reviewed.  Constitutional:      General: She is not in acute distress.    Appearance: She is well-developed.  HENT:     Head: Normocephalic and atraumatic.  Eyes:     Conjunctiva/sclera: Conjunctivae normal.  Cardiovascular:     Rate and Rhythm: Normal rate and regular rhythm.  Pulmonary:     Effort: Pulmonary effort is normal. No respiratory distress.     Breath sounds: Normal breath sounds. No stridor.  Abdominal:     General: There is no distension.     Tenderness: There is abdominal tenderness in the epigastric area.  Skin:    General: Skin is warm and dry.  Neurological:     Mental Status: She is alert and oriented to person, place, and time.     Cranial Nerves: No cranial nerve deficit.  Psychiatric:        Mood and Affect: Mood normal.     ED Results / Procedures / Treatments   Labs (all labs ordered are listed, but only abnormal results are displayed) Labs Reviewed  COMPREHENSIVE METABOLIC PANEL - Abnormal; Notable for the following components:      Result Value   Sodium 129 (*)    Chloride 89 (*)    Glucose, Bld 326 (*)    BUN 25 (*)    Creatinine, Ser 1.22 (*)    GFR, Estimated 51 (*)    Anion gap 16 (*)    All other components within normal limits  LIPASE, BLOOD - Abnormal; Notable for the following components:   Lipase 207 (*)    All other components within normal limits  CBC WITH DIFFERENTIAL/PLATELET - Abnormal; Notable for the following components:   RBC 5.34 (*)    HCT 46.1 (*)    All other components within normal limits  URINALYSIS, ROUTINE W REFLEX MICROSCOPIC - Abnormal; Notable for the following components:   Color, Urine STRAW (*)    Glucose, UA >=500 (*)    Ketones, ur 5 (*)    All other components within normal limits  I-STAT VENOUS BLOOD GAS, ED - Abnormal;  Notable for the following components:   pH, Ven 7.533 (*)    pCO2, Ven 37.7 (*)    pO2, Ven 67 (*)    Bicarbonate 31.8 (*)    TCO2 33 (*)    Acid-Base Excess 9.0 (*)    Sodium 131 (*)    Calcium, Ion 1.00 (*)    Hemoglobin 15.6 (*)    All other components within normal limits  CBG MONITORING, ED - Abnormal; Notable for the following components:   Glucose-Capillary  235 (*)    All other components within normal limits  ETHANOL  BASIC METABOLIC PANEL  LIPID PANEL  LACTATE DEHYDROGENASE    EKG EKG Interpretation Date/Time:  Thursday November 14 2023 07:41:55 EST Ventricular Rate:  103 PR Interval:  163 QRS Duration:  89 QT Interval:  346 QTC Calculation: 453 R Axis:   94  Text Interpretation: Sinus tachycardia Probable left atrial enlargement Right axis deviation Confirmed by Gerhard Munch [phone]) on 11/14/2023 8:02:50 AM  Radiology No results found.  Procedures Procedures    Medications Ordered in ED Medications  pantoprazole (PROTONIX) injection 40 mg (40 mg Intravenous Given 11/14/23 1125)  insulin aspart (novoLOG) injection 0-15 Units ( Subcutaneous Not Given 11/14/23 1551)  enoxaparin (LOVENOX) injection 40 mg (40 mg Subcutaneous Given 11/14/23 1246)  morphine (PF) 2 MG/ML injection 1 mg ( Intravenous See Alternative 11/14/23 1544)    Or  morphine (PF) 2 MG/ML injection 2 mg (2 mg Intravenous Given 11/14/23 1544)  ondansetron (ZOFRAN) tablet 4 mg ( Oral See Alternative 11/14/23 1546)    Or  ondansetron (ZOFRAN) injection 4 mg (4 mg Intravenous Given 11/14/23 1546)  insulin glargine-yfgn (SEMGLEE) injection 9 Units (has no administration in time range)  sodium chloride 0.9 % bolus 1,000 mL (0 mLs Intravenous Stopped 11/14/23 1044)  famotidine (PEPCID) IVPB 20 mg premix (0 mg Intravenous Stopped 11/14/23 1043)  sucralfate (CARAFATE) 1 GM/10ML suspension 1 g (1 g Oral Given 11/14/23 0845)  sodium chloride 0.9 % bolus 1,000 mL (0 mLs Intravenous Stopped 11/14/23 1230)   sodium chloride 0.9 % bolus 1,000 mL (0 mLs Intravenous Stopping previously hung infusion 11/14/23 1525)    ED Course/ Medical Decision Making/ A&P                                 Medical Decision Making Patient with multiple medical including peptic ulcer disease, heart failure, NSTEMI, now presents with recurrent epigastric pain.  Absence of other abdominal pain, peritonitis is reassuring for low suspicion of perforation, peritonitis.  Absence of other chest pain is reassuring for lower suspicion ACS this is a consideration.  Some suspicion for gastroesophageal versus pancreatitis pain.  Initial vital signs notable for mild tachycardia consistent with either of these.  Patient had IV meds, labs, monitoring. Cardiac 105 sinus tach abnormal Pulse ox 100% room air normal   Amount and/or Complexity of Data Reviewed External Data Reviewed: notes.    Details: Notes including those from yesterday and CT imaging from last week reviewed patient has had multiple CT scans for the past 6 months Labs: ordered. Decision-making details documented in ED Course. Radiology: ordered and independent interpretation performed. Decision-making details documented in ED Course. ECG/medicine tests: ordered and independent interpretation performed. Decision-making details documented in ED Course.  Risk Prescription drug management. Decision regarding hospitalization. Diagnosis or treatment significantly limited by social determinants of health.   Patient in similar condition, findings concerning for acute pancreatitis with concurrent evidence for acute kidney injury.  Patient received fluid resuscitation, analgesics, antiemetics, no indication for additional CT imaging as she had the study performed 3 days ago, has an otherwise soft, nonperitoneal abdomen.        Final Clinical Impression(s) / ED Diagnoses Final diagnoses:  Acute pancreatitis, unspecified complication status, unspecified pancreatitis  type  AKI (acute kidney injury) ([name])     Gerhard Munch, MD 11/14/23 1552

## 2023-11-14 NOTE — ED Triage Notes (Signed)
Pt arrives via ems to the er from home for the c/o chest pain x 5 days, seen at [facility] yesterday and discharged. Chest pain radiates to left arm. Pt also c/o headache, bs with ems is 346, frequent urination per pt.   VSS PER EMS  BP 114/90 108P 98% 02 RA

## 2023-11-14 NOTE — Plan of Care (Signed)

## 2023-11-14 NOTE — H&P (Addendum)
History and Physical    Patient: [name] [name] ZOX:[id number] DOB: 08/24/1961 DOA: 11/14/2023 DOS: the patient was seen and examined on 11/14/2023 PCP: Everardo All, NP  Patient coming from: Home  Chief Complaint:  Chief Complaint  Patient presents with   Hyperglycemia   Chest Pain   HPI: [name] [name] is a 62 y.o. female with medical history significant of poorly controlled insulin dependent diabetes mellitus (HGB A1C >15.5% in September 2024 ), history of DKA, pancreatitis, IBS, HTN, HLD, hypertension, hypothyroidism, CKD stage II, gastroparesis, peptic ulcer disease. She had an EGD/EUS about ~1 month ago that showed severe LA grade D erosive esophagitis, gastritis and duodenal ulcer.  She was started on PPI and Carafate.  She had reported worsening generalized abdominal pain since EGD.  She has had monthly admissions since May of this year with nausea, vomiting, and abdominal pain playing some role in her reason for presenting to the emergency department.   Today she presents to the emergency department via EMS after an ED visit yesterday and discharge from the hospital 2 days ago reporting ongoing epigastric/abdominal pain, nausea, and vomiting.  She reports she has vomited twice since 5 AM this morning and that is why she presented to the ED. She describes the pain as 10 out of 10 aching and intermittent burning.  On my evaluation the patient reports she has been eating and drinking fine but she reported decreased p.o. intake to the ED physician.She does endorse poor compliance with her insulin, normally taking less than prescribed, citing a hypoglycemic event back in November and fears that recurring overnight while she is home.  She denies EtOH use, diarrhea, constipation, dyspnea, palpitations, or radiation of the pain.  ED Course: On arrival to [facility], ED she was noted TempTo be afebrile 30 6.7C, BP 156/101, HR 103, RR 12, SpO2 100% on room air.  Labs notable for Lipase of 207,  sodium 129, blood glucose elevated at 326, creatinine 1.22/BUN 25, and a slight increase in anion gap to 16.  VBG obtained pH 7.53, pCO2 37, pO2 67, bicarb 31, O2 saturation 95%.  UA collected and without evidence of UTI, does show glucosuria.  CT Abd pelvis done 3 days ago without evidence of pancreatitis, no imaging obtained today in ED.  Patient given IV Pepcid, IV fluid bolus, and Carafate.  TRH contacted for admission.  Review of Systems: As mentioned in the history of present illness. All other systems reviewed and are negative. Past Medical History:  Diagnosis Date   Allergy    seasonal   Anxiety    B12 deficiency    Chest pain 04/05/2017   CHF (congestive heart failure) ([name])    Diabetes mellitus type 1, uncontrolled    "dr. 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) 10/29/2013   Leg cramping    "@ night" (09/18/2013)   Migraine    "once q couple months" (11/26/2018)   Osteoporosis    Peripheral neuropathy    Umbilical hernia    unrepaired (09/18/2013)   Past Surgical History:  Procedure Laterality Date   BIOPSY  06/03/2019   Procedure: BIOPSY;  Surgeon: Lynann Bologna, MD;  Location: [facility];  Service: Endoscopy;;   BIOPSY  08/17/2020   Procedure: BIOPSY;  Surgeon: Tressia Danas, MD;  Location: [facility];  Service: Gastroenterology;;   BIOPSY  02/05/2023   Procedure: BIOPSY;  Surgeon: Meryl Dare, MD;  Location: [facility];  Service: Gastroenterology;;   BIOPSY  09/24/2023   Procedure: BIOPSY;  Surgeon: Lemar Lofty., MD;  Location: [facility];  Service: Gastroenterology;;   CESAREAN SECTION  1989   ENTEROSCOPY N/A 06/03/2019   Procedure: ENTEROSCOPY;  Surgeon: Lynann Bologna, MD;   Location: [facility];  Service: Endoscopy;  Laterality: N/A;   ESOPHAGOGASTRODUODENOSCOPY (EGD) WITH PROPOFOL N/A 08/17/2020   Procedure: ESOPHAGOGASTRODUODENOSCOPY (EGD) WITH PROPOFOL;  Surgeon: Tressia Danas, MD;  Location: [facility];  Service: Gastroenterology;  Laterality: N/A;   ESOPHAGOGASTRODUODENOSCOPY (EGD) WITH PROPOFOL N/A 02/05/2023   Procedure: ESOPHAGOGASTRODUODENOSCOPY (EGD) WITH PROPOFOL;  Surgeon: Meryl Dare, MD;  Location: [facility];  Service: Gastroenterology;  Laterality: N/A;   ESOPHAGOGASTRODUODENOSCOPY (EGD) WITH PROPOFOL N/A 09/24/2023   Procedure: ESOPHAGOGASTRODUODENOSCOPY (EGD) WITH PROPOFOL;  Surgeon: Meridee Score Netty Starring., MD;  Location: [facility];  Service: Gastroenterology;  Laterality: N/A;   EUS N/A 09/24/2023   Procedure: UPPER ENDOSCOPIC ULTRASOUND (EUS) LINEAR;  Surgeon: Lemar Lofty., MD;  Location: [facility];  Service: Gastroenterology;  Laterality: N/A;   LAPAROSCOPIC ASSISTED VAGINAL HYSTERECTOMY  09/23/2012   Procedure: LAPAROSCOPIC ASSISTED VAGINAL HYSTERECTOMY;  Surgeon: Mitchel Honour, DO;  Location: [facility];  Service: Gynecology;  Laterality: N/A;  pull Dr Vance Gather instrument   LEFT HEART CATH AND CORONARY ANGIOGRAPHY N/A 02/11/2017   Procedure: Left Heart Cath and Coronary Angiography;  Surgeon: Runell Gess, MD;  [facility];  Service: Cardiovascular;  Laterality: N/A;   TUBAL LIGATION Bilateral 1992   Social History:  reports that she quit smoking about 32 years ago. Her smoking use included cigarettes. She started smoking about 42 years ago. She has a 1.2 pack-year smoking history. She has never used smokeless tobacco. She reports that she does not currently use drugs after having used the following drugs: Marijuana. She reports that she does not drink alcohol.  Allergies  Allergen Reactions   Codeine Hives, Anxiety and Other (See Comments)    Family History  Problem Relation Age of Onset    Diabetes Other    Heart disease Other    Heart failure Mother 46       Her heart skipped a beat and stopped - per pt   Diabetes Maternal Grandmother    Alzheimer's disease Maternal Grandmother    Coronary artery disease Maternal Grandmother    Heart attack Maternal Grandfather    Heart failure Brother    Diabetes Brother    Colon polyps Neg Hx    Esophageal cancer Neg Hx    Liver cancer Neg Hx    Pancreatic cancer Neg Hx    Stomach cancer Neg Hx    Crohn's disease Neg Hx    Rectal cancer Neg Hx     Prior to Admission medications   Medication Sig Start Date End Date Taking? Authorizing Provider  aspirin EC 81 MG tablet Take 81 mg by mouth daily. Swallow whole.    [provider]  azelastine (ASTELIN) 0.1 % nasal spray Place 1 spray into both nostrils 2 (two) times daily. 11/04/23   [provider]  Continuous Blood Gluc Sensor (DEXCOM G6 SENSOR) MISC INJECT 1 SENSOR INTO SKIN EVERY 10 DAYS Patient taking differently: Inject 1 Device into the skin See admin instructions. Place 1 new device into the skin every 10  days 07/18/22   Excell Seltzer, MD  Continuous Blood Gluc Transmit (DEXCOM G6 TRANSMITTER) MISC USE AS DIRECTED FOR CONTINUOUS GLUCOSE MONITORING. REUSE TRANSMITTER X 90DAYS THEN DISCARD & REPLACE 07/05/21   Bedsole, Amy E, MD  escitalopram (LEXAPRO) 10 MG tablet Take 10 mg by mouth daily. 09/09/23   [provider]  Glucagon, rDNA, (GLUCAGON EMERGENCY) 1 MG KIT Inject 1 mg into the skin See admin instructions. Inject one mg into the skin see administration instructions. Follow package directions for low blood sugar. 06/18/23   [provider]  levothyroxine (SYNTHROID) 137 MCG tablet Take 137 mcg by mouth daily before breakfast. 03/19/22   [provider]  metoCLOPramide (REGLAN) 5 MG tablet Take 1 tablet (5 mg total) by mouth 3 (three) times daily before meals. 11/12/23   Danford, Earl Lites, MD  midodrine (PROAMATINE) 5 MG tablet Take 1  tablet (5 mg total) by mouth 2 (two) times daily with a meal. Hold if your systolic blood pressure  ( top number) is > , please check your blood pressure at home once a day and bring in record for your doctor to review, follow up with your pcp and cardiology regarding your blood pressure medication Patient taking differently: Take 5 mg by mouth daily. 02/06/23   Albertine Grates, MD  NOVOLOG FLEXPEN 100 UNIT/ML FlexPen Inject 4 Units into the skin 4 (four) times daily. Per sliding sclae    [provider]  omega-3 acid ethyl esters (LOVAZA) 1 g capsule Take 2 capsules (2 g total) by mouth 2 (two) times daily. 09/13/23   Alwyn Ren, MD  pantoprazole (PROTONIX) 40 MG tablet Take 1 tablet (40 mg total) by mouth 2 (two) times daily. 10/28/23 11/27/23  Almon Hercules, MD  QUEtiapine (SEROQUEL) 50 MG tablet Take 50 mg by mouth at bedtime. 09/09/23   [provider]  ranolazine (RANEXA) 500 MG 12 hr tablet Take 500 mg by mouth 2 (two) times daily. 07/23/23   [provider]  rosuvastatin (CRESTOR) 10 MG tablet Take 10 mg by mouth daily. 06/17/23   [provider]  sucralfate (CARAFATE) 1 GM/10ML suspension Take 10 mLs (1 g total) by mouth 2 (two) times daily. Do not take any other medication within 2-4 hours 10/28/23   Arnaldo Natal, NP  tamsulosin (FLOMAX) 0.4 MG CAPS capsule Take 1 capsule (0.4 mg total) by mouth daily after supper. 09/04/23   Arnetha Courser, MD  TOUJEO SOLOSTAR 300 UNIT/ML Solostar Pen Inject 25 Units into the skin at bedtime. 10/28/23   Almon Hercules, MD  traZODone (DESYREL) 50 MG tablet Take 50 mg by mouth at bedtime.    [provider]    Physical Exam: Vitals:   11/14/23 0800 11/14/23 1000 11/14/23 1129 11/14/23 1400  BP: (!) 158/107 (!) 140/96  110/87  Pulse: (!) 102 100  95  Resp: 11 19  17   Temp:   97.8 F (36.6 C)   TempSrc:      SpO2: 100% 99%  98%    Constitutional: NAD, calm, comfortable Eyes: PERRL, lids and  conjunctivae normal ENMT: Mucous membranes are moist. Posterior pharynx clear of any exudate or lesions. Neck: normal, supple, no masses, no thyromegaly Respiratory: clear to auscultation bilaterally, no wheezing, no crackles. Normal respiratory effort. No accessory muscle use.  Cardiovascular: Regular rate and rhythm, no murmurs / rubs / gallops. No extremity edema. 2+ radial and pedal pulses.  Abdomen: epigastric and RLQ tenderness. No hepatosplenomegaly. Bowel sounds positive  x4 quadrants.  Musculoskeletal: no clubbing / cyanosis. No joint deformity upper and lower extremities. Good ROM, no contractures. Normal muscle tone.  Skin: no rashes, lesions, ulcers.  Neurologic: Alert and oriented x 3.   Data Reviewed:  CBC    Component Value Date/Time   WBC 9.5 11/14/2023 0809   RBC 5.34 (H) 11/14/2023 0809   HGB 15.6 (H) 11/14/2023 0826   HGB 15.7 07/11/2020 1231   HCT 46.0 11/14/2023 0826   HCT 48.3 (H) 07/11/2020 1231   PLT 377 11/14/2023 0809   PLT 318 07/11/2020 1231   MCV 86.3 11/14/2023 0809   MCV 89 07/11/2020 1231   MCH 27.9 11/14/2023 0809   MCHC 32.3 11/14/2023 0809   RDW 14.7 11/14/2023 0809   RDW 13.7 07/11/2020 1231   LYMPHSABS 2.1 11/14/2023 0809   MONOABS 0.5 11/14/2023 0809   EOSABS 0.1 11/14/2023 0809   BASOSABS 0.1 11/14/2023 0809   CMP     Component Value Date/Time   NA 131 (L) 11/14/2023 0826   NA 138 09/04/2021 1031   K 4.9 11/14/2023 0826   CL 89 (L) 11/14/2023 0809   CO2 24 11/14/2023 0809   GLUCOSE 326 (H) 11/14/2023 0809   BUN 25 (H) 11/14/2023 0809   BUN 11 09/04/2021 1031   CREATININE 1.22 (H) 11/14/2023 0809   CREATININE 0.64 01/05/2022 1630   CALCIUM 9.6 11/14/2023 0809   PROT 7.1 11/14/2023 0809   ALBUMIN 3.9 11/14/2023 0809   AST 20 11/14/2023 0809   ALT 12 11/14/2023 0809   ALKPHOS 69 11/14/2023 0809   BILITOT 1.0 11/14/2023 0809   GFR 78.96 10/21/2023 0959   EGFR 104 09/04/2021 1031   GFRNONAA 51 (L) 11/14/2023 0809   Alcohol  Level    Component Value Date/Time   ETH <10 11/14/2023 0802   Lipase     Component Value Date/Time   LIPASE 207 (H) 11/14/2023 0809   Venous Blood Gas result:  pO2 67; pCO2 37; pH 7.53;  HCO3 31, %O2 Sat 95.  Results for orders placed or performed during the hospital encounter of 11/11/23  Resp panel by RT-PCR (RSV, Flu A&B, Covid) Anterior Nasal Swab     Status: None   Collection Time: 11/12/23  1:56 AM   Specimen: Anterior Nasal Swab  Result Value Ref Range Status   SARS Coronavirus 2 by RT PCR NEGATIVE NEGATIVE Final    Comment: (NOTE) SARS-CoV-2 target nucleic acids are NOT DETECTED.  The SARS-CoV-2 RNA is generally detectable in upper respiratory specimens during the acute phase of infection. The lowest concentration of SARS-CoV-2 viral copies this assay can detect is 138 copies/mL. A negative result does not preclude SARS-Cov-2 infection and should not be used as the sole basis for treatment or other patient management decisions. A negative result may occur with  improper specimen collection/handling, submission of specimen other than nasopharyngeal swab, presence of viral mutation(s) within the areas targeted by this assay, and inadequate number of viral copies(<138 copies/mL). A negative result must be combined with clinical observations, patient history, and epidemiological information. The expected result is Negative.  Fact Sheet for Patients:  BloggerCourse.com  Fact Sheet for Healthcare Providers:  SeriousBroker.it  This test is no t yet approved or cleared by the [place] FDA and  has been authorized for detection and/or diagnosis of SARS-CoV-2 by FDA under an Emergency Use Authorization (EUA). This EUA will remain  in effect (meaning this test can be used) for the duration of the COVID-19 declaration under  Section 564(b)(1) of the Act, 21 U.S.C.section 360bbb-3(b)(1), unless the authorization is  terminated  or revoked sooner.       Influenza A by PCR NEGATIVE NEGATIVE Final   Influenza B by PCR NEGATIVE NEGATIVE Final    Comment: (NOTE) The Xpert Xpress SARS-CoV-2/FLU/RSV plus assay is intended as an aid in the diagnosis of influenza from Nasopharyngeal swab specimens and should not be used as a sole basis for treatment. Nasal washings and aspirates are unacceptable for Xpert Xpress SARS-CoV-2/FLU/RSV testing.  Fact Sheet for Patients: BloggerCourse.com  Fact Sheet for Healthcare Providers: SeriousBroker.it  This test is not yet approved or cleared by the [place] FDA and has been authorized for detection and/or diagnosis of SARS-CoV-2 by FDA under an Emergency Use Authorization (EUA). This EUA will remain in effect (meaning this test can be used) for the duration of the COVID-19 declaration under Section 564(b)(1) of the Act, 21 U.S.C. section 360bbb-3(b)(1), unless the authorization is terminated or revoked.     Resp Syncytial Virus by PCR NEGATIVE NEGATIVE Final    Comment: (NOTE) Fact Sheet for Patients: BloggerCourse.com  Fact Sheet for Healthcare Providers: SeriousBroker.it  This test is not yet approved or cleared by the [place] FDA and has been authorized for detection and/or diagnosis of SARS-CoV-2 by FDA under an Emergency Use Authorization (EUA). This EUA will remain in effect (meaning this test can be used) for the duration of the COVID-19 declaration under Section 564(b)(1) of the Act, 21 U.S.C. section 360bbb-3(b)(1), unless the authorization is terminated or revoked.  Performed at [facility] [address] [place], [place] [address]      Assessment and Plan: #Acute Pancreatitis Lipase elevated at 207. Patient with prior h/o acute pancreatitis most recently 09/14/23 presenting with similar symptoms. CT abdomen  pelvis done 3 days ago during recent ED visit, will not repeat. -Strict NPO for now -IVF hydration  -Pain control with PRN morphine    -PRN antiemetics -LDH, Lipid panel  #Acute Kidney Injury Creatinine 1.22 on AM BMP, increased from 0.7 during most recent hospitalization. Pre-renal in setting of dehydration, Poor PO intake - Continue IV fluid hydration. - Avoid nephrotoxins, contrast Dye, Hypotension and Dehydration to Ensure Adequate Renal Perfusion  - Continue to Monitor and Trend Renal Function, repeat BMP with AM labs  #Insulin Dependent Diabetes Mellitus - 9U Semglee qHS, dose reduced while NPO - q4H CBG monitoring and SSI  #Hypertension BP elevated on admission in setting of pain, not on outpatient antihypertensives, is prescribed midodrine. - Treat pain as above - Hold Midodrine  #Hypothyroidism - Continue home Synthroid when diet resumed  #Hyperlipidemia - Continue home crestor when diet resumed  #Duodenitis - IV PPI  - Continue carafate when diet resumed  #Depression - Continue home Lexapro when diet resumed  #Remote hx of Paroxysmal Atrial Fibrillation In setting of uncontrolled thyroid disease. No known recurrence since thyroid disease treated. Rate controlled, not on home anticoagulation  VTE prophylaxis: Lovenox  GI prophylaxis: IV Protonix Diet: NPO Access: PIV Lines: NONE Code Status: FULL Telemetry: Yes Disposition: Place in observation  Advance Care Planning:   Code Status: Full Code   Consults: None called  Family Communication: Husband [name] updated via phone   Severity of Illness: The appropriate patient status for this patient is OBSERVATION. Observation status is judged to be reasonable and necessary in order to provide the required intensity of service to ensure the patient's safety. The patient's presenting symptoms, physical exam findings, and initial radiographic  and laboratory data in the context of their medical condition is felt to  place them at decreased risk for further clinical deterioration. Furthermore, it is anticipated that the patient will be medically stable for discharge from the hospital within 2 midnights of admission.   To reach the provider On-Call:   7AM- 7PM see care teams to locate the attending and reach out to them via [email or web address] Password: [email or web address] 7PM-7AM contact night-coverage If you still have difficulty reaching the appropriate provider, please page the [facility] (Director on Call) for [facility] on amion for assistance  This document was prepared using Conservation officer, historic buildings and may include unintentional dictation errors.  Bishop Limbo FNP-BC, PMHNP-BC Nurse Practitioner [facility]

## 2023-11-15 DIAGNOSIS — I10 Essential (primary) hypertension: Secondary | ICD-10-CM

## 2023-11-15 DIAGNOSIS — K3184 Gastroparesis: Secondary | ICD-10-CM

## 2023-11-15 DIAGNOSIS — N182 Chronic kidney disease, stage 2 (mild): Secondary | ICD-10-CM | POA: Diagnosis present

## 2023-11-15 DIAGNOSIS — I252 Old myocardial infarction: Secondary | ICD-10-CM | POA: Diagnosis not present

## 2023-11-15 DIAGNOSIS — E1022 Type 1 diabetes mellitus with diabetic chronic kidney disease: Secondary | ICD-10-CM | POA: Diagnosis present

## 2023-11-15 DIAGNOSIS — Z8711 Personal history of peptic ulcer disease: Secondary | ICD-10-CM | POA: Diagnosis not present

## 2023-11-15 DIAGNOSIS — Z87891 Personal history of nicotine dependence: Secondary | ICD-10-CM | POA: Diagnosis not present

## 2023-11-15 DIAGNOSIS — N179 Acute kidney failure, unspecified: Secondary | ICD-10-CM | POA: Diagnosis present

## 2023-11-15 DIAGNOSIS — F32A Depression, unspecified: Secondary | ICD-10-CM | POA: Diagnosis present

## 2023-11-15 DIAGNOSIS — T383X6A Underdosing of insulin and oral hypoglycemic [antidiabetic] drugs, initial encounter: Secondary | ICD-10-CM | POA: Diagnosis present

## 2023-11-15 DIAGNOSIS — E039 Hypothyroidism, unspecified: Secondary | ICD-10-CM | POA: Diagnosis present

## 2023-11-15 DIAGNOSIS — E1065 Type 1 diabetes mellitus with hyperglycemia: Secondary | ICD-10-CM | POA: Diagnosis present

## 2023-11-15 DIAGNOSIS — Z79890 Hormone replacement therapy (postmenopausal): Secondary | ICD-10-CM | POA: Diagnosis not present

## 2023-11-15 DIAGNOSIS — K219 Gastro-esophageal reflux disease without esophagitis: Secondary | ICD-10-CM | POA: Diagnosis present

## 2023-11-15 DIAGNOSIS — I129 Hypertensive chronic kidney disease with stage 1 through stage 4 chronic kidney disease, or unspecified chronic kidney disease: Secondary | ICD-10-CM | POA: Diagnosis present

## 2023-11-15 DIAGNOSIS — E872 Acidosis, unspecified: Secondary | ICD-10-CM | POA: Diagnosis present

## 2023-11-15 DIAGNOSIS — E860 Dehydration: Secondary | ICD-10-CM | POA: Diagnosis present

## 2023-11-15 DIAGNOSIS — K2970 Gastritis, unspecified, without bleeding: Secondary | ICD-10-CM | POA: Diagnosis not present

## 2023-11-15 DIAGNOSIS — Z79899 Other long term (current) drug therapy: Secondary | ICD-10-CM | POA: Diagnosis not present

## 2023-11-15 DIAGNOSIS — R1013 Epigastric pain: Secondary | ICD-10-CM | POA: Diagnosis present

## 2023-11-15 DIAGNOSIS — K2980 Duodenitis without bleeding: Secondary | ICD-10-CM | POA: Diagnosis present

## 2023-11-15 DIAGNOSIS — E785 Hyperlipidemia, unspecified: Secondary | ICD-10-CM | POA: Diagnosis present

## 2023-11-15 DIAGNOSIS — K859 Acute pancreatitis without necrosis or infection, unspecified: Secondary | ICD-10-CM | POA: Diagnosis present

## 2023-11-15 DIAGNOSIS — E876 Hypokalemia: Secondary | ICD-10-CM | POA: Diagnosis present

## 2023-11-15 DIAGNOSIS — Z794 Long term (current) use of insulin: Secondary | ICD-10-CM | POA: Diagnosis not present

## 2023-11-15 DIAGNOSIS — M810 Age-related osteoporosis without current pathological fracture: Secondary | ICD-10-CM | POA: Diagnosis present

## 2023-11-15 DIAGNOSIS — E1043 Type 1 diabetes mellitus with diabetic autonomic (poly)neuropathy: Secondary | ICD-10-CM | POA: Diagnosis present

## 2023-11-15 LAB — CBC
HCT: 36.5 % (ref 36.0–46.0)
Hemoglobin: 12.2 g/dL (ref 12.0–15.0)
MCH: 28.5 pg (ref 26.0–34.0)
MCHC: 33.4 g/dL (ref 30.0–36.0)
MCV: 85.3 fL (ref 80.0–100.0)
Platelets: 322 10*3/uL (ref 150–400)
RBC: 4.28 MIL/uL (ref 3.87–5.11)
RDW: 14.6 % (ref 11.5–15.5)
WBC: 8 10*3/uL (ref 4.0–10.5)
nRBC: 0 % (ref 0.0–0.2)

## 2023-11-15 LAB — COMPREHENSIVE METABOLIC PANEL
ALT: 11 U/L (ref 0–44)
AST: 17 U/L (ref 15–41)
Albumin: 3 g/dL — ABNORMAL LOW (ref 3.5–5.0)
Alkaline Phosphatase: 47 U/L (ref 38–126)
Anion gap: 11 (ref 5–15)
BUN: 13 mg/dL (ref 6–20)
CO2: 19 mmol/L — ABNORMAL LOW (ref 22–32)
Calcium: 7.6 mg/dL — ABNORMAL LOW (ref 8.9–10.3)
Chloride: 108 mmol/L (ref 98–111)
Creatinine, Ser: 0.69 mg/dL (ref 0.44–1.00)
GFR, Estimated: >60 mL/min (ref >60)
Glucose, Bld: 64 mg/dL — ABNORMAL LOW (ref 70–99)
Potassium: 3.4 mmol/L — ABNORMAL LOW (ref 3.5–5.1)
Sodium: 138 mmol/L (ref 135–145)
Total Bilirubin: 0.8 mg/dL (ref <1.2)
Total Protein: 5.5 g/dL — ABNORMAL LOW (ref 6.5–8.1)

## 2023-11-15 LAB — GLUCOSE, CAPILLARY
Glucose-Capillary: 100 mg/dL — ABNORMAL HIGH (ref 70–99)
Glucose-Capillary: 105 mg/dL — ABNORMAL HIGH (ref 70–99)
Glucose-Capillary: 180 mg/dL — ABNORMAL HIGH (ref 70–99)
Glucose-Capillary: 196 mg/dL — ABNORMAL HIGH (ref 70–99)
Glucose-Capillary: 69 mg/dL — ABNORMAL LOW (ref 70–99)
Glucose-Capillary: 75 mg/dL (ref 70–99)
Glucose-Capillary: 82 mg/dL (ref 70–99)

## 2023-11-15 LAB — LIPASE, BLOOD: Lipase: 25 U/L (ref 11–51)

## 2023-11-15 LAB — MAGNESIUM: Magnesium: 1.7 mg/dL (ref 1.7–2.4)

## 2023-11-15 MED ORDER — POTASSIUM CHLORIDE CRYS ER 10 MEQ PO TBCR
40.0000 meq | EXTENDED_RELEASE_TABLET | Freq: Once | ORAL | Status: AC
Start: 2023-11-15 — End: 2023-11-15
  Administered 2023-11-15: 40 meq via ORAL
  Filled 2023-11-15: qty 4

## 2023-11-15 MED ORDER — MAGNESIUM SULFATE 4 GM/100ML IV SOLN
4.0000 g | Freq: Once | INTRAVENOUS | Status: AC
  Administered 2023-11-15: 4 g via INTRAVENOUS
  Filled 2023-11-15: qty 100

## 2023-11-15 MED ORDER — KCL IN DEXTROSE-NACL 40-5-0.9 MEQ/L-%-% IV SOLN
INTRAVENOUS | Status: AC
  Filled 2023-11-15 (×3): qty 1000

## 2023-11-15 MED ORDER — POTASSIUM CHLORIDE CRYS ER 20 MEQ PO TBCR: 40.0000 meq | EXTENDED_RELEASE_TABLET | Freq: Once | ORAL | Status: DC

## 2023-11-15 NOTE — Plan of Care (Signed)

## 2023-11-15 NOTE — Progress Notes (Addendum)
PROGRESS NOTE    [name] [name]  WUJ:[id number] DOB: 1961/09/23 DOA: 11/14/2023 PCP: Everardo All, NP    Chief Complaint  Patient presents with   Hyperglycemia   Chest Pain    Brief Narrative:  Patient 62 year old female history of poorly controlled insulin-dependent diabetes mellitus with hemoglobin A1c > 15.5 (08/2023), history of DKA, pancreatitis, IBS, hypertension, hyperlipidemia, hypothyroidism, CKD stage II, gastroparesis, peptic ulcer disease, recent EGD/EUS with severe LA grade D erosive esophagitis, gastritis and duodenal ulcer supposed to be on PPI and Carafate presented to the ED with worsening epigastric abdominal pain, nausea and vomiting. Patient noted to have been seen in the ED 1 day prior to admission and discharged from the hospital with ongoing epigastric abdominal pain nausea and vomiting per patient. Patient seen in the ED labs obtained concerning for an acute pancreatitis. Patient recent CT abdomen and pelvis done 3 days prior to admission without evidence of pancreatitis and not repeated in the ED. Patient admitted for further evaluation and management.    Assessment & Plan:   Principal Problem:   Acute pancreatitis Active Problems:   Essential hypertension   Hypothyroidism   GERD   AKI (acute kidney injury) ([name])   Dehydration   Duodenitis   Gastroparesis   Gastritis and gastroduodenitis   Depression  #1 acute pancreatitis -Patient presented with epigastric abdominal pain, nausea and vomiting, prior history of pancreatitis 09/14/2023 with similar symptoms. -CT abdomen and pelvis done 3 days prior to admission and as such will not repeat. -Patient with clinical improvement with improvement with upper abdominal/epigastric pain and significant decrease in lipase level currently at 25 from 207 on admission.   -Patient still with some complaints of abdominal pain.   -Trial of clear liquids. -Continue IV fluids, pain management, IV antiemetics, supportive  care.   2.  AKI -Likely secondary to prerenal azotemia in the setting of poor oral intake. -Improved with hydration.  -Repeat labs in AM.   3.  Insulin-dependent diabetes mellitus type 2, poorly controlled -Hemoglobin A1c > 15.5 (08/22/2023) -CBG noted at 69 this morning.  -Patient on bowel rest. -Patient noted to be on 25 units of Toujeo daily as well as Reglan. -Discontinue Semglee.  Continue SSI.   -Continue IV Reglan every 6 hours.   -Place on D5 normal saline.   -Diabetes coordinator consulted.   4.  Hypertension -Patient not on antihypertensive medications prior to admission. -Manage pain for now. -IV hydralazine as needed.   5.  Hypothyroidism -Continue Synthroid.   6.  Hyperlipidemia -Continue to hold statin likely resume on discharge.   7.  Duodenitis -Continue IV PPI twice daily, Carafate.  -Outpatient follow-up with GI.   8.  Depression -Continue home regimen Lexapro.  9.  Hypokalemia -Replete.    DVT prophylaxis: Lovenox Code Status: Full Family Communication: Updated patient.  No family at bedside. Disposition: Home when clinically improved.  Status is: Observation The patient will require care spanning > 2 midnights and should be moved to inpatient because: Severity of illness   Consultants:  None  Procedures:  None  Antimicrobials:  Anti-infectives (From admission, onward)    None         Subjective: Patient laying in bed.  Denies any chest pain.  No shortness of breath.  States upper abdominal/epigastric pain improving although has some intermittent pain.  Some complaints of lower abdominal pain/discomfort.  States nausea and vomiting has improved.  Objective: Vitals:   11/15/23 0023 11/15/23 0411 11/15/23 9562 11/15/23 1617  BP: 91/61 (!) 108/56 128/76 117/71  Pulse: 74 79 99 73  Resp: 18 18 17 17   Temp: 98.5 F (36.9 C) 97.9 F (36.6 C) 98.5 F (36.9 C) 97.7 F (36.5 C)  TempSrc: Oral Oral Oral Oral  SpO2: 97% 100% 100%  100%  Weight:      Height:        Intake/Output Summary (Last 24 hours) at 11/15/2023 1824 Last data filed at 11/15/2023 1716 Gross per 24 hour  Intake 120 ml  Output --  Net 120 ml   Filed Weights   11/14/23 1529  Weight: 57.4 kg    Examination:  General exam: Appears calm and comfortable  Respiratory system: Clear to auscultation. Respiratory effort normal. Cardiovascular system: S1 & S2 heard, RRR. No JVD, murmurs, rubs, gallops or clicks. No pedal edema. Gastrointestinal system: Abdomen is nondistended, soft and significantly less tender to palpation epigastrium.  Some abdominal discomfort/tenderness in suprapubic region.  Central nervous system: Alert and oriented. No focal neurological deficits. Extremities: Symmetric 5 x 5 power. Skin: No rashes, lesions or ulcers Psychiatry: Judgement and insight appear normal. Mood & affect appropriate.     Data Reviewed: I have personally reviewed following labs and imaging studies  CBC: Recent Labs  Lab 11/11/23 1640 11/12/23 0510 11/14/23 0809 11/14/23 0826 11/15/23 0528  WBC 12.8* 10.0 9.5  --  8.0  NEUTROABS 11.3*  --  6.6  --   --   HGB 14.8 13.4 14.9 15.6* 12.2  HCT 42.8 39.6 46.1* 46.0 36.5  MCV 85.1 85.7 86.3  --  85.3  PLT 379 309 377  --  322    Basic Metabolic Panel: Recent Labs  Lab 11/11/23 1640 11/12/23 0510 11/14/23 0809 11/14/23 0826 11/14/23 1737 11/15/23 0528  NA 130* 130* 129* 131* 139 138  K 3.2* 3.6 4.2 4.9 3.2* 3.4*  CL 90* 94* 89*  --  106 108  CO2 28 29 24   --  23 19*  GLUCOSE 486* 156* 326*  --  91 64*  BUN 19 16 25*  --  14 13  CREATININE 0.79 0.66 1.22*  --  0.65 0.69  CALCIUM 9.2 8.5* 9.6  --  8.2* 7.6*  MG  --   --   --   --   --  1.7    GFR: Estimated Creatinine Clearance: 57.7 mL/min (by C-G formula based on SCr of 0.69 mg/dL).  Liver Function Tests: Recent Labs  Lab 11/11/23 1640 11/14/23 0809 11/15/23 0528  AST 15 20 17   ALT 17 12 11   ALKPHOS 80 69 47  BILITOT 0.8  1.0 0.8  PROT 8.2* 7.1 5.5*  ALBUMIN 4.6 3.9 3.0*    CBG: Recent Labs  Lab 11/15/23 0409 11/15/23 0812 11/15/23 1149 11/15/23 1247 11/15/23 1740  GLUCAP 75 105* 69* 196* 180*     Recent Results (from the past 240 hour(s))  Resp panel by RT-PCR (RSV, Flu A&B, Covid) Anterior Nasal Swab     Status: None   Collection Time: 11/12/23  1:56 AM   Specimen: Anterior Nasal Swab  Result Value Ref Range Status   SARS Coronavirus 2 by RT PCR NEGATIVE NEGATIVE Final    Comment: (NOTE) SARS-CoV-2 target nucleic acids are NOT DETECTED.  The SARS-CoV-2 RNA is generally detectable in upper respiratory specimens during the acute phase of infection. The lowest concentration of SARS-CoV-2 viral copies this assay can detect is 138 copies/mL. A negative result does not preclude SARS-Cov-2 infection and should not be used  as the sole basis for treatment or other patient management decisions. A negative result may occur with  improper specimen collection/handling, submission of specimen other than nasopharyngeal swab, presence of viral mutation(s) within the areas targeted by this assay, and inadequate number of viral copies(<138 copies/mL). A negative result must be combined with clinical observations, patient history, and epidemiological information. The expected result is Negative.  Fact Sheet for Patients:  BloggerCourse.com  Fact Sheet for Healthcare Providers:  SeriousBroker.it  This test is no t yet approved or cleared by the [place] FDA and  has been authorized for detection and/or diagnosis of SARS-CoV-2 by FDA under an Emergency Use Authorization (EUA). This EUA will remain  in effect (meaning this test can be used) for the duration of the COVID-19 declaration under Section 564(b)(1) of the Act, 21 U.S.C.section 360bbb-3(b)(1), unless the authorization is terminated  or revoked sooner.       Influenza A by PCR NEGATIVE  NEGATIVE Final   Influenza B by PCR NEGATIVE NEGATIVE Final    Comment: (NOTE) The Xpert Xpress SARS-CoV-2/FLU/RSV plus assay is intended as an aid in the diagnosis of influenza from Nasopharyngeal swab specimens and should not be used as a sole basis for treatment. Nasal washings and aspirates are unacceptable for Xpert Xpress SARS-CoV-2/FLU/RSV testing.  Fact Sheet for Patients: BloggerCourse.com  Fact Sheet for Healthcare Providers: SeriousBroker.it  This test is not yet approved or cleared by the [place] FDA and has been authorized for detection and/or diagnosis of SARS-CoV-2 by FDA under an Emergency Use Authorization (EUA). This EUA will remain in effect (meaning this test can be used) for the duration of the COVID-19 declaration under Section 564(b)(1) of the Act, 21 U.S.C. section 360bbb-3(b)(1), unless the authorization is terminated or revoked.     Resp Syncytial Virus by PCR NEGATIVE NEGATIVE Final    Comment: (NOTE) Fact Sheet for Patients: BloggerCourse.com  Fact Sheet for Healthcare Providers: SeriousBroker.it  This test is not yet approved or cleared by the [place] FDA and has been authorized for detection and/or diagnosis of SARS-CoV-2 by FDA under an Emergency Use Authorization (EUA). This EUA will remain in effect (meaning this test can be used) for the duration of the COVID-19 declaration under Section 564(b)(1) of the Act, 21 U.S.C. section 360bbb-3(b)(1), unless the authorization is terminated or revoked.  Performed at [facility], [address], [place], [place] [address]   Urine Culture     Status: None   Collection Time: 11/13/23  8:27 AM   Specimen: Urine, Clean Catch  Result Value Ref Range Status   Specimen Description   Final    URINE, CLEAN CATCH Performed at [facility], [address], [place], [place] [address]    Special Requests   Final    NONE Performed at [facility], [address], [place], [place] [address]    Culture   Final    NO GROWTH Performed at [facility], [address], [place], [place] [address]    Report Status 11/14/2023 FINAL  Final         Radiology Studies: No results found.      Scheduled Meds:  enoxaparin (LOVENOX) injection  40 mg Subcutaneous Q24H   escitalopram  10 mg Oral Daily   insulin aspart  0-15 Units Subcutaneous Q4H   levothyroxine  137 mcg Oral QAC breakfast   metoCLOPramide (REGLAN) injection  5 mg Intravenous Q6H   pantoprazole (PROTONIX) IV  40 mg Intravenous Q12H   QUEtiapine  50 mg Oral QHS   sucralfate  1 g Oral BID   Continuous Infusions:  dextrose 5 % and 0.9 % NaCl with KCl 40 mEq/L 125 mL/hr at 11/15/23 0947     LOS: 0 days    Time spent: 35 minutes    Ramiro Harvest, MD [facility]   To contact the attending provider between 7A-7P or the covering provider during after hours 7P-7A, please log into the web site [email or web address] and access using universal [place] password for that web site. If you do not have the password, please call the hospital operator.  11/15/2023, 6:24 PM

## 2023-11-15 NOTE — Inpatient Diabetes Management (Addendum)
Inpatient Diabetes Program Recommendations  AACE/ADA: New Consensus Statement on Inpatient Glycemic Control (2015)  Target Ranges:  Prepandial:   less than 140 mg/dL      Peak postprandial:   less than 180 mg/dL (1-2 hours)      Critically ill patients:  140 - 180 mg/dL   Lab Results  Component Value Date   GLUCAP 105 (H) 11/15/2023   HGBA1C >15.5 (H) 08/22/2023    Review of Glycemic Control  Latest Reference Range & Units 11/14/23 19:29 11/15/23 00:25 11/15/23 04:09 11/15/23 08:12  Glucose-Capillary 70 - 99 mg/dL 852 (H) 82 75 778 (H)  (H): Data is abnormally high  Diabetes history: DM2 Outpatient Diabetes medications: Toujeo 12 units every day, Novolog 4 units TID with meals and SSI Current orders for Inpatient glycemic control: Novolog 0-15 units Q4H  Inpatient Diabetes Program Recommendations:    Met with patient at bedside.  Confirmed above DM medications.  Will correct medication reconciliation.  Patient confirms she has DM2; sees Dr. Criselda Peaches with [facility].  Referral received for A1C> 10%.  Our team has spoke with her regarding her A1C on 10/25/23.    Will continue to follow while inpatient.  Thank you, Dulce Sellar, MSN, CDCES Diabetes Coordinator Inpatient Diabetes Program [phone] (team pager from 8a-5p)

## 2023-11-16 DIAGNOSIS — K859 Acute pancreatitis without necrosis or infection, unspecified: Secondary | ICD-10-CM | POA: Diagnosis not present

## 2023-11-16 DIAGNOSIS — K2970 Gastritis, unspecified, without bleeding: Secondary | ICD-10-CM

## 2023-11-16 DIAGNOSIS — I10 Essential (primary) hypertension: Secondary | ICD-10-CM | POA: Diagnosis not present

## 2023-11-16 DIAGNOSIS — K2990 Gastroduodenitis, unspecified, without bleeding: Secondary | ICD-10-CM

## 2023-11-16 DIAGNOSIS — F32A Depression, unspecified: Secondary | ICD-10-CM | POA: Diagnosis not present

## 2023-11-16 DIAGNOSIS — E860 Dehydration: Secondary | ICD-10-CM | POA: Diagnosis not present

## 2023-11-16 LAB — BASIC METABOLIC PANEL
Anion gap: 11 (ref 5–15)
BUN: 5 mg/dL — ABNORMAL LOW (ref 6–20)
CO2: 15 mmol/L — ABNORMAL LOW (ref 22–32)
Calcium: 7.6 mg/dL — ABNORMAL LOW (ref 8.9–10.3)
Chloride: 109 mmol/L (ref 98–111)
Creatinine, Ser: 0.57 mg/dL (ref 0.44–1.00)
GFR, Estimated: >60 mL/min (ref >60)
Glucose, Bld: 97 mg/dL (ref 70–99)
Potassium: 4.8 mmol/L (ref 3.5–5.1)
Sodium: 135 mmol/L (ref 135–145)

## 2023-11-16 LAB — CBC
HCT: 43 % (ref 36.0–46.0)
Hemoglobin: 13.9 g/dL (ref 12.0–15.0)
MCH: 28.3 pg (ref 26.0–34.0)
MCHC: 32.3 g/dL (ref 30.0–36.0)
MCV: 87.4 fL (ref 80.0–100.0)
Platelets: 369 10*3/uL (ref 150–400)
RBC: 4.92 MIL/uL (ref 3.87–5.11)
RDW: 14.9 % (ref 11.5–15.5)
WBC: 7.9 10*3/uL (ref 4.0–10.5)
nRBC: 0 % (ref 0.0–0.2)

## 2023-11-16 LAB — GLUCOSE, CAPILLARY
Glucose-Capillary: 113 mg/dL — ABNORMAL HIGH (ref 70–99)
Glucose-Capillary: 147 mg/dL — ABNORMAL HIGH (ref 70–99)
Glucose-Capillary: 163 mg/dL — ABNORMAL HIGH (ref 70–99)
Glucose-Capillary: 173 mg/dL — ABNORMAL HIGH (ref 70–99)
Glucose-Capillary: 187 mg/dL — ABNORMAL HIGH (ref 70–99)
Glucose-Capillary: 197 mg/dL — ABNORMAL HIGH (ref 70–99)
Glucose-Capillary: 212 mg/dL — ABNORMAL HIGH (ref 70–99)
Glucose-Capillary: 66 mg/dL — ABNORMAL LOW (ref 70–99)

## 2023-11-16 LAB — MAGNESIUM: Magnesium: 2.6 mg/dL — ABNORMAL HIGH (ref 1.7–2.4)

## 2023-11-16 MED ORDER — PANTOPRAZOLE SODIUM 40 MG PO TBEC
40.0000 mg | DELAYED_RELEASE_TABLET | Freq: Two times a day (BID) | ORAL | Status: DC
  Administered 2023-11-17 – 2023-11-18 (×3): 40 mg via ORAL
  Filled 2023-11-16 (×3): qty 1

## 2023-11-16 MED ORDER — SODIUM BICARBONATE 650 MG PO TABS
650.0000 mg | ORAL_TABLET | Freq: Two times a day (BID) | ORAL | Status: DC
  Administered 2023-11-16 – 2023-11-18 (×5): 650 mg via ORAL
  Filled 2023-11-16 (×5): qty 1

## 2023-11-16 MED ORDER — INSULIN ASPART 100 UNIT/ML IJ SOLN
0.0000 [IU] | Freq: Three times a day (TID) | INTRAMUSCULAR | Status: DC
  Administered 2023-11-17: 5 [IU] via SUBCUTANEOUS
  Administered 2023-11-18: 3 [IU] via SUBCUTANEOUS

## 2023-11-16 NOTE — Progress Notes (Addendum)
PROGRESS NOTE    [name] [name]  VWU:[id number] DOB: 1961/02/14 DOA: 11/14/2023 PCP: Everardo All, NP    Chief Complaint  Patient presents with   Hyperglycemia   Chest Pain    Brief Narrative:  Patient 62 year old female history of poorly controlled insulin-dependent diabetes mellitus with hemoglobin A1c > 15.5 (08/2023), history of DKA, pancreatitis, IBS, hypertension, hyperlipidemia, hypothyroidism, CKD stage II, gastroparesis, peptic ulcer disease, recent EGD/EUS with severe LA grade D erosive esophagitis, gastritis and duodenal ulcer supposed to be on PPI and Carafate presented to the ED with worsening epigastric abdominal pain, nausea and vomiting. Patient noted to have been seen in the ED 1 day prior to admission and discharged from the hospital with ongoing epigastric abdominal pain nausea and vomiting per patient. Patient seen in the ED labs obtained concerning for an acute pancreatitis. Patient recent CT abdomen and pelvis done 3 days prior to admission without evidence of pancreatitis and not repeated in the ED. Patient admitted for further evaluation and management.    Assessment & Plan:   Principal Problem:   Acute pancreatitis Active Problems:   Essential hypertension   Hypothyroidism   GERD   AKI (acute kidney injury) ([name])   Dehydration   Duodenitis   Gastroparesis   Gastritis and gastroduodenitis   Depression  #1 acute pancreatitis -Patient presented with epigastric abdominal pain, nausea and vomiting, prior history of pancreatitis 09/14/2023 with similar symptoms. -CT abdomen and pelvis done 3 days prior to admission and as such will not repeat. -Clinical improvement.   -Lipase levels have trended down was 25 from 207 on admission.   -Patient tried on clear liquids which she is tolerating.   -Advance to full liquids.   -Continue pain management, IV antiemetics, supportive care.   2.  AKI -Likely secondary to prerenal azotemia in the setting of poor oral  intake. -Improved with hydration.   -Repeat labs in AM.   3.  Insulin-dependent diabetes mellitus type 2, poorly controlled -Hemoglobin A1c > 15.5 (08/22/2023) -CBG noted at 197 this morning.  -Patient started on clear liquids and diet being advanced to full liquid diet today.  -Patient noted to be on 25 units of Toujeo daily as well as Reglan. -Semglee discontinued.  SSI.   -Continue IV Reglan every 6 hours.   -Discontinue IV fluids.   -Diabetes coordinator consulted.   4.  Hypertension -Patient not on antihypertensive medications prior to admission. -Blood pressure improved and stable with management of pain.   -IV hydralazine as needed.   5.  Hypothyroidism -Synthroid.   6.  Hyperlipidemia -Continue to hold statin.    7.  Duodenitis -Continue Carafate.   -Change IV PPI to oral PPI twice daily.   -Outpatient follow-up with GI.   8.  Depression -Continue Lexapro.   9.  Hypokalemia -Repleted, potassium of 4.8 today.  10.  Metabolic acidosis -Patient currently afebrile. -CBGs within normal limits, anion gap closed. -Bicarb tablets twice daily x 3 days.    DVT prophylaxis: Lovenox Code Status: Full Family Communication: Updated patient.  No family at bedside. Disposition: Home when clinically improved and tolerating oral intake over the next 24 to 48 hours..  Status is: Observation The patient will require care spanning > 2 midnights and should be moved to inpatient because: Severity of illness   Consultants:  None  Procedures:  None  Antimicrobials:  Anti-infectives (From admission, onward)    None         Subjective: Patient sleeping but easily arousable.  Overall states she is feeling much better than she did on admission.  Denies any chest pain or shortness of breath.  Denies any further nausea or emesis.  States tolerating clear liquids.  Has not tried full liquids yet.    Objective: Vitals:   11/15/23 2100 11/16/23 0052 11/16/23 0509 11/16/23  0731  BP:  (!) 89/64 120/66 (!) 142/82  Pulse:  73 77 90  Resp:  18 18 18   Temp: 97.6 F (36.4 C) 99 F (37.2 C) (!) 97.5 F (36.4 C) 97.6 F (36.4 C)  TempSrc: Oral Oral Oral Oral  SpO2:  99% 99% 100%  Weight:      Height:        Intake/Output Summary (Last 24 hours) at 11/16/2023 1053 Last data filed at 11/16/2023 0454 Gross per 24 hour  Intake 2657.67 ml  Output --  Net 2657.67 ml   Filed Weights   11/14/23 1529  Weight: 57.4 kg    Examination:  General exam: NAD. Respiratory system: Lungs clear to auscultation bilaterally.  No wheezes, no crackles, no rhonchi.  Fair air movement.  Speaking in full sentences.   Cardiovascular system: Regular rate rhythm no murmurs rubs or gallops.  No JVD.  No lower extremity edema.  Gastrointestinal system: Abdomen is soft, nontender, nondistended, positive bowel sounds.  No rebound.  No guarding.   Central nervous system: Alert and oriented. No focal neurological deficits. Extremities: Symmetric 5 x 5 power. Skin: No rashes, lesions or ulcers Psychiatry: Judgement and insight appear normal. Mood & affect appropriate.     Data Reviewed: I have personally reviewed following labs and imaging studies  CBC: Recent Labs  Lab 11/11/23 1640 11/12/23 0510 11/14/23 0809 11/14/23 0826 11/15/23 0528 11/16/23 0535  WBC 12.8* 10.0 9.5  --  8.0 7.9  NEUTROABS 11.3*  --  6.6  --   --   --   HGB 14.8 13.4 14.9 15.6* 12.2 13.9  HCT 42.8 39.6 46.1* 46.0 36.5 43.0  MCV 85.1 85.7 86.3  --  85.3 87.4  PLT 379 309 377  --  322 369    Basic Metabolic Panel: Recent Labs  Lab 11/12/23 0510 11/14/23 0809 11/14/23 0826 11/14/23 1737 11/15/23 0528 11/16/23 0535  NA 130* 129* 131* 139 138 135  K 3.6 4.2 4.9 3.2* 3.4* 4.8  CL 94* 89*  --  106 108 109  CO2 29 24  --  23 19* 15*  GLUCOSE 156* 326*  --  91 64* 97  BUN 16 25*  --  14 13 5*  CREATININE 0.66 1.22*  --  0.65 0.69 0.57  CALCIUM 8.5* 9.6  --  8.2* 7.6* 7.6*  MG  --   --   --    --  1.7 2.6*    GFR: Estimated Creatinine Clearance: 57.7 mL/min (by C-G formula based on SCr of 0.57 mg/dL).  Liver Function Tests: Recent Labs  Lab 11/11/23 1640 11/14/23 0809 11/15/23 0528  AST 15 20 17   ALT 17 12 11   ALKPHOS 80 69 47  BILITOT 0.8 1.0 0.8  PROT 8.2* 7.1 5.5*  ALBUMIN 4.6 3.9 3.0*    CBG: Recent Labs  Lab 11/15/23 1740 11/15/23 2048 11/16/23 0051 11/16/23 0508 11/16/23 0733  GLUCAP 180* 100* 187* 113* 197*     Recent Results (from the past 240 hour(s))  Resp panel by RT-PCR (RSV, Flu A&B, Covid) Anterior Nasal Swab     Status: None   Collection Time: 11/12/23  1:56 AM  Specimen: Anterior Nasal Swab  Result Value Ref Range Status   SARS Coronavirus 2 by RT PCR NEGATIVE NEGATIVE Final    Comment: (NOTE) SARS-CoV-2 target nucleic acids are NOT DETECTED.  The SARS-CoV-2 RNA is generally detectable in upper respiratory specimens during the acute phase of infection. The lowest concentration of SARS-CoV-2 viral copies this assay can detect is 138 copies/mL. A negative result does not preclude SARS-Cov-2 infection and should not be used as the sole basis for treatment or other patient management decisions. A negative result may occur with  improper specimen collection/handling, submission of specimen other than nasopharyngeal swab, presence of viral mutation(s) within the areas targeted by this assay, and inadequate number of viral copies(<138 copies/mL). A negative result must be combined with clinical observations, patient history, and epidemiological information. The expected result is Negative.  Fact Sheet for Patients:  BloggerCourse.com  Fact Sheet for Healthcare Providers:  SeriousBroker.it  This test is no t yet approved or cleared by the [place] FDA and  has been authorized for detection and/or diagnosis of SARS-CoV-2 by FDA under an Emergency Use Authorization (EUA). This EUA  will remain  in effect (meaning this test can be used) for the duration of the COVID-19 declaration under Section 564(b)(1) of the Act, 21 U.S.C.section 360bbb-3(b)(1), unless the authorization is terminated  or revoked sooner.       Influenza A by PCR NEGATIVE NEGATIVE Final   Influenza B by PCR NEGATIVE NEGATIVE Final    Comment: (NOTE) The Xpert Xpress SARS-CoV-2/FLU/RSV plus assay is intended as an aid in the diagnosis of influenza from Nasopharyngeal swab specimens and should not be used as a sole basis for treatment. Nasal washings and aspirates are unacceptable for Xpert Xpress SARS-CoV-2/FLU/RSV testing.  Fact Sheet for Patients: BloggerCourse.com  Fact Sheet for Healthcare Providers: SeriousBroker.it  This test is not yet approved or cleared by the [place] FDA and has been authorized for detection and/or diagnosis of SARS-CoV-2 by FDA under an Emergency Use Authorization (EUA). This EUA will remain in effect (meaning this test can be used) for the duration of the COVID-19 declaration under Section 564(b)(1) of the Act, 21 U.S.C. section 360bbb-3(b)(1), unless the authorization is terminated or revoked.     Resp Syncytial Virus by PCR NEGATIVE NEGATIVE Final    Comment: (NOTE) Fact Sheet for Patients: BloggerCourse.com  Fact Sheet for Healthcare Providers: SeriousBroker.it  This test is not yet approved or cleared by the [place] FDA and has been authorized for detection and/or diagnosis of SARS-CoV-2 by FDA under an Emergency Use Authorization (EUA). This EUA will remain in effect (meaning this test can be used) for the duration of the COVID-19 declaration under Section 564(b)(1) of the Act, 21 U.S.C. section 360bbb-3(b)(1), unless the authorization is terminated or revoked.  Performed at [facility], [address], [place], [place] [address]   Urine Culture     Status: None   Collection Time: 11/13/23  8:27 AM   Specimen: Urine, Clean Catch  Result Value Ref Range Status   Specimen Description   Final    URINE, CLEAN CATCH Performed at [facility], [address], [place], [place] [address]    Special Requests   Final    NONE Performed at [facility], [address] [place], [place] [address]    Culture   Final    NO GROWTH Performed at [facility], [address], [place], [place] [address]  Report Status 11/14/2023 FINAL  Final         Radiology Studies: No results found.      Scheduled Meds:  enoxaparin (LOVENOX) injection  40 mg Subcutaneous Q24H   escitalopram  10 mg Oral Daily   insulin aspart  0-15 Units Subcutaneous TID WC   levothyroxine  137 mcg Oral QAC breakfast   metoCLOPramide (REGLAN) injection  5 mg Intravenous Q6H   pantoprazole (PROTONIX) IV  40 mg Intravenous Q12H   QUEtiapine  50 mg Oral QHS   sodium bicarbonate  650 mg Oral BID   sucralfate  1 g Oral BID   Continuous Infusions:     LOS: 1 day    Time spent: 35 minutes    Ramiro Harvest, MD [facility]   To contact the attending provider between 7A-7P or the covering provider during after hours 7P-7A, please log into the web site [email or web address] and access using universal [place] password for that web site. If you do not have the password, please call the hospital operator.  11/16/2023, 10:53 AM

## 2023-11-17 DIAGNOSIS — F32A Depression, unspecified: Secondary | ICD-10-CM | POA: Diagnosis not present

## 2023-11-17 DIAGNOSIS — E860 Dehydration: Secondary | ICD-10-CM | POA: Diagnosis not present

## 2023-11-17 DIAGNOSIS — K859 Acute pancreatitis without necrosis or infection, unspecified: Secondary | ICD-10-CM | POA: Diagnosis not present

## 2023-11-17 DIAGNOSIS — I10 Essential (primary) hypertension: Secondary | ICD-10-CM | POA: Diagnosis not present

## 2023-11-17 LAB — GLUCOSE, CAPILLARY
Glucose-Capillary: 166 mg/dL — ABNORMAL HIGH (ref 70–99)
Glucose-Capillary: 206 mg/dL — ABNORMAL HIGH (ref 70–99)
Glucose-Capillary: 104 mg/dL — ABNORMAL HIGH (ref 70–99)
Glucose-Capillary: 173 mg/dL — ABNORMAL HIGH (ref 70–99)

## 2023-11-17 LAB — BASIC METABOLIC PANEL
Anion gap: 7 (ref 5–15)
BUN: <5 mg/dL — ABNORMAL LOW (ref 6–20)
CO2: 25 mmol/L (ref 22–32)
Calcium: 8.3 mg/dL — ABNORMAL LOW (ref 8.9–10.3)
Chloride: 104 mmol/L (ref 98–111)
Creatinine, Ser: 0.74 mg/dL (ref 0.44–1.00)
GFR, Estimated: >60 mL/min (ref >60)
Glucose, Bld: 153 mg/dL — ABNORMAL HIGH (ref 70–99)
Potassium: 4.3 mmol/L (ref 3.5–5.1)
Sodium: 136 mmol/L (ref 135–145)

## 2023-11-17 MED ORDER — METOCLOPRAMIDE HCL 5 MG PO TABS
5.0000 mg | ORAL_TABLET | Freq: Three times a day (TID) | ORAL | Status: DC
  Administered 2023-11-17 – 2023-11-18 (×4): 5 mg via ORAL
  Filled 2023-11-17 (×4): qty 1

## 2023-11-17 NOTE — Progress Notes (Signed)
PROGRESS NOTE    [name] [name]  BJY:[id number] DOB: [birth year] DOA: 11/14/2023 PCP: Everardo All, NP    Chief Complaint  Patient presents with   Hyperglycemia   Chest Pain    Brief Narrative:  Patient 62 year old female history of poorly controlled insulin-dependent diabetes mellitus with hemoglobin A1c > 15.5 (08/2023), history of DKA, pancreatitis, IBS, hypertension, hyperlipidemia, hypothyroidism, CKD stage II, gastroparesis, peptic ulcer disease, recent EGD/EUS with severe LA grade D erosive esophagitis, gastritis and duodenal ulcer supposed to be on PPI and Carafate presented to the ED with worsening epigastric abdominal pain, nausea and vomiting. Patient noted to have been seen in the ED 1 day prior to admission and discharged from the hospital with ongoing epigastric abdominal pain nausea and vomiting per patient. Patient seen in the ED labs obtained concerning for an acute pancreatitis. Patient recent CT abdomen and pelvis done 3 days prior to admission without evidence of pancreatitis and not repeated in the ED. Patient admitted for further evaluation and management.    Assessment & Plan:   Principal Problem:   Acute pancreatitis Active Problems:   Essential hypertension   Hypothyroidism   GERD   AKI (acute kidney injury) ([name])   Dehydration   Duodenitis   Gastroparesis   Gastritis and gastroduodenitis   Depression  #1 acute pancreatitis -Patient presented with epigastric abdominal pain, nausea and vomiting, prior history of pancreatitis 09/14/2023 with similar symptoms. -CT abdomen and pelvis done 3 days prior to admission and as such will not repeat. -Clinical improvement.   -Lipase levels have trended down was 25 from 207 on admission.   -Patient tried on clear liquids which she tolerated, diet advanced to full liquid diet which she is tolerating.   -Advance to carb modified diet.   -Continue pain management, IV antiemetics, supportive care.     2.  AKI -Likely  secondary to prerenal azotemia in the setting of poor oral intake. -Improved with hydration.   -Repeat labs in AM.   3.  Insulin-dependent diabetes mellitus type 2, poorly controlled -Hemoglobin A1c > 15.5 (08/22/2023) -CBG noted at 166 this morning.  -Patient started on clear liquids which she tolerated, diet advanced to full liquid diet which patient tolerated.  -Patient noted to be on 25 units of Toujeo daily as well as Reglan prior to admission. -Semglee discontinued.  SSI.   -Change IV Reglan to oral Reglan.   -Diabetes coordinator consulted.   4.  Hypertension -Patient not on antihypertensive medications prior to admission. -Blood pressure improved and stable with management of pain.   -IV hydralazine as needed.   5.  Hypothyroidism -Continue Synthroid.   6.  Hyperlipidemia -Continue to hold statin likely resume on discharge..    7.  Duodenitis -Continue Carafate, PPI.   -Outpatient follow-up with GI.    8.  Depression -Lexapro.    9.  Hypokalemia -Potassium of 4.3.   10.  Metabolic acidosis -Patient currently afebrile. -CBGs within normal limits, anion gap closed. -Improved on bicarb tablets.  -Continue bicarb tablets for another 24 hours and subsequently discontinue.    DVT prophylaxis: Lovenox Code Status: Full Family Communication: Updated patient.  No family at bedside. Disposition: Home when clinically improved and tolerating oral intake hopefully in the next 24 hours.   Status is: Observation The patient will require care spanning > 2 midnights and should be moved to inpatient because: Severity of illness   Consultants:  None  Procedures:  None  Antimicrobials:  Anti-infectives (From admission, onward)  None         Subjective: Laying in bed.  States does not have abdominal pain this morning.  Denies any chest pain or shortness of breath.  No nausea or vomiting.  Tolerated full liquids yesterday.    Objective: Vitals:   11/17/23 0406  11/17/23 0410 11/17/23 0810 11/17/23 0814  BP:  109/66 126/77   Pulse:  72 81   Resp:  18 18   Temp:  98.8 F (37.1 C) 100.2 F (37.9 C) (!) 97.4 F (36.3 C)  TempSrc:  Oral Oral Oral  SpO2:  99% 99%   Weight: 55.7 kg     Height:        Intake/Output Summary (Last 24 hours) at 11/17/2023 0955 Last data filed at 11/17/2023 0845 Gross per 24 hour  Intake 840 ml  Output --  Net 840 ml   Filed Weights   11/14/23 1529 11/17/23 0406  Weight: 57.4 kg 55.7 kg    Examination:  General exam: NAD. Respiratory system: Lungs clear to auscultation bilaterally.  No wheezes, no crackles, no rhonchi.  Fair air movement.  Speaking in full sentences.   Cardiovascular system: RRR no murmurs rubs or gallops.  No JVD.  No lower extremity edema.  Gastrointestinal system: Abdomen soft, nontender, nondistended, positive bowel sounds.  No rebound.  No guarding.   Central nervous system: Alert and oriented. No focal neurological deficits. Extremities: Symmetric 5 x 5 power. Skin: No rashes, lesions or ulcers Psychiatry: Judgement and insight appear normal. Mood & affect appropriate.     Data Reviewed: I have personally reviewed following labs and imaging studies  CBC: Recent Labs  Lab 11/11/23 1640 11/12/23 0510 11/14/23 0809 11/14/23 0826 11/15/23 0528 11/16/23 0535  WBC 12.8* 10.0 9.5  --  8.0 7.9  NEUTROABS 11.3*  --  6.6  --   --   --   HGB 14.8 13.4 14.9 15.6* 12.2 13.9  HCT 42.8 39.6 46.1* 46.0 36.5 43.0  MCV 85.1 85.7 86.3  --  85.3 87.4  PLT 379 309 377  --  322 369    Basic Metabolic Panel: Recent Labs  Lab 11/14/23 0809 11/14/23 0826 11/14/23 1737 11/15/23 0528 11/16/23 0535 11/17/23 0704  NA 129* 131* 139 138 135 136  K 4.2 4.9 3.2* 3.4* 4.8 4.3  CL 89*  --  106 108 109 104  CO2 24  --  23 19* 15* 25  GLUCOSE 326*  --  91 64* 97 153*  BUN 25*  --  14 13 5* <5*  CREATININE 1.22*  --  0.65 0.69 0.57 0.74  CALCIUM 9.6  --  8.2* 7.6* 7.6* 8.3*  MG  --   --   --   1.7 2.6*  --     GFR: Estimated Creatinine Clearance: 56.9 mL/min (by C-G formula based on SCr of 0.74 mg/dL).  Liver Function Tests: Recent Labs  Lab 11/11/23 1640 11/14/23 0809 11/15/23 0528  AST 15 20 17   ALT 17 12 11   ALKPHOS 80 69 47  BILITOT 0.8 1.0 0.8  PROT 8.2* 7.1 5.5*  ALBUMIN 4.6 3.9 3.0*    CBG: Recent Labs  Lab 11/16/23 1344 11/16/23 1627 11/16/23 1807 11/16/23 2021 11/17/23 0812  GLUCAP 147* 163* 212* 173* 166*     Recent Results (from the past 240 hour(s))  Resp panel by RT-PCR (RSV, Flu A&B, Covid) Anterior Nasal Swab     Status: None   Collection Time: 11/12/23  1:56 AM   Specimen:  Anterior Nasal Swab  Result Value Ref Range Status   SARS Coronavirus 2 by RT PCR NEGATIVE NEGATIVE Final    Comment: (NOTE) SARS-CoV-2 target nucleic acids are NOT DETECTED.  The SARS-CoV-2 RNA is generally detectable in upper respiratory specimens during the acute phase of infection. The lowest concentration of SARS-CoV-2 viral copies this assay can detect is 138 copies/mL. A negative result does not preclude SARS-Cov-2 infection and should not be used as the sole basis for treatment or other patient management decisions. A negative result may occur with  improper specimen collection/handling, submission of specimen other than nasopharyngeal swab, presence of viral mutation(s) within the areas targeted by this assay, and inadequate number of viral copies(<138 copies/mL). A negative result must be combined with clinical observations, patient history, and epidemiological information. The expected result is Negative.  Fact Sheet for Patients:  BloggerCourse.com  Fact Sheet for Healthcare Providers:  SeriousBroker.it  This test is no t yet approved or cleared by the [place] FDA and  has been authorized for detection and/or diagnosis of SARS-CoV-2 by FDA under an Emergency Use Authorization (EUA). This EUA  will remain  in effect (meaning this test can be used) for the duration of the COVID-19 declaration under Section 564(b)(1) of the Act, 21 U.S.C.section 360bbb-3(b)(1), unless the authorization is terminated  or revoked sooner.       Influenza A by PCR NEGATIVE NEGATIVE Final   Influenza B by PCR NEGATIVE NEGATIVE Final    Comment: (NOTE) The Xpert Xpress SARS-CoV-2/FLU/RSV plus assay is intended as an aid in the diagnosis of influenza from Nasopharyngeal swab specimens and should not be used as a sole basis for treatment. Nasal washings and aspirates are unacceptable for Xpert Xpress SARS-CoV-2/FLU/RSV testing.  Fact Sheet for Patients: BloggerCourse.com  Fact Sheet for Healthcare Providers: SeriousBroker.it  This test is not yet approved or cleared by the [place] FDA and has been authorized for detection and/or diagnosis of SARS-CoV-2 by FDA under an Emergency Use Authorization (EUA). This EUA will remain in effect (meaning this test can be used) for the duration of the COVID-19 declaration under Section 564(b)(1) of the Act, 21 U.S.C. section 360bbb-3(b)(1), unless the authorization is terminated or revoked.     Resp Syncytial Virus by PCR NEGATIVE NEGATIVE Final    Comment: (NOTE) Fact Sheet for Patients: BloggerCourse.com  Fact Sheet for Healthcare Providers: SeriousBroker.it  This test is not yet approved or cleared by the [place] FDA and has been authorized for detection and/or diagnosis of SARS-CoV-2 by FDA under an Emergency Use Authorization (EUA). This EUA will remain in effect (meaning this test can be used) for the duration of the COVID-19 declaration under Section 564(b)(1) of the Act, 21 U.S.C. section 360bbb-3(b)(1), unless the authorization is terminated or revoked.  Performed at [facility], [address] [place], [place] [address]   Urine Culture     Status: None   Collection Time: 11/13/23  8:27 AM   Specimen: Urine, Clean Catch  Result Value Ref Range Status   Specimen Description   Final    URINE, CLEAN CATCH Performed at [facility], [address] [place], [place] [address]    Special Requests   Final    NONE Performed at [facility], [address] [place], [place] [address]    Culture   Final    NO GROWTH Performed at [facility], [address], [place], [place] [address]  Report Status 11/14/2023 FINAL  Final         Radiology Studies: No results found.      Scheduled Meds:  enoxaparin (LOVENOX) injection  40 mg Subcutaneous Q24H   escitalopram  10 mg Oral Daily   insulin aspart  0-15 Units Subcutaneous TID WC   levothyroxine  137 mcg Oral QAC breakfast   metoCLOPramide (REGLAN) injection  5 mg Intravenous Q6H   pantoprazole  40 mg Oral BID AC   QUEtiapine  50 mg Oral QHS   sodium bicarbonate  650 mg Oral BID   sucralfate  1 g Oral BID   Continuous Infusions:     LOS: 2 days    Time spent: 35 minutes    Ramiro Harvest, MD [facility]   To contact the attending provider between 7A-7P or the covering provider during after hours 7P-7A, please log into the web site [email or web address] and access using universal [place] password for that web site. If you do not have the password, please call the hospital operator.  11/17/2023, 9:55 AM

## 2023-11-17 NOTE — Plan of Care (Signed)

## 2023-11-18 ENCOUNTER — Telehealth (HOSPITAL_COMMUNITY): Payer: Self-pay | Admitting: Pharmacy Technician

## 2023-11-18 ENCOUNTER — Other Ambulatory Visit (HOSPITAL_COMMUNITY): Payer: Self-pay

## 2023-11-18 DIAGNOSIS — E1065 Type 1 diabetes mellitus with hyperglycemia: Secondary | ICD-10-CM

## 2023-11-18 DIAGNOSIS — K2970 Gastritis, unspecified, without bleeding: Secondary | ICD-10-CM | POA: Diagnosis not present

## 2023-11-18 DIAGNOSIS — I10 Essential (primary) hypertension: Secondary | ICD-10-CM | POA: Diagnosis not present

## 2023-11-18 DIAGNOSIS — N179 Acute kidney failure, unspecified: Secondary | ICD-10-CM | POA: Diagnosis not present

## 2023-11-18 DIAGNOSIS — K859 Acute pancreatitis without necrosis or infection, unspecified: Secondary | ICD-10-CM | POA: Diagnosis not present

## 2023-11-18 LAB — BASIC METABOLIC PANEL
Anion gap: 8 (ref 5–15)
BUN: 10 mg/dL (ref 6–20)
CO2: 25 mmol/L (ref 22–32)
Calcium: 8.5 mg/dL — ABNORMAL LOW (ref 8.9–10.3)
Chloride: 100 mmol/L (ref 98–111)
Creatinine, Ser: 0.92 mg/dL (ref 0.44–1.00)
GFR, Estimated: >60 mL/min (ref >60)
Glucose, Bld: 173 mg/dL — ABNORMAL HIGH (ref 70–99)
Potassium: 4.1 mmol/L (ref 3.5–5.1)
Sodium: 133 mmol/L — ABNORMAL LOW (ref 135–145)

## 2023-11-18 LAB — GLUCOSE, CAPILLARY: Glucose-Capillary: 173 mg/dL — ABNORMAL HIGH (ref 70–99)

## 2023-11-18 MED ORDER — PANTOPRAZOLE SODIUM 40 MG PO TBEC
40.0000 mg | DELAYED_RELEASE_TABLET | Freq: Two times a day (BID) | ORAL | 1 refills | Status: DC
  Filled 2023-11-18: qty 60, 30d supply, fill #0

## 2023-11-18 MED ORDER — ROSUVASTATIN CALCIUM 10 MG PO TABS
10.0000 mg | ORAL_TABLET | Freq: Every day | ORAL | 1 refills | Status: DC
  Filled 2023-11-18: qty 30, 30d supply, fill #0

## 2023-11-18 MED ORDER — ESCITALOPRAM OXALATE 10 MG PO TABS
10.0000 mg | ORAL_TABLET | Freq: Every day | ORAL | 1 refills | Status: DC
  Filled 2023-11-18: qty 30, 30d supply, fill #0

## 2023-11-18 MED ORDER — SUCRALFATE 1 G PO TABS
1.0000 g | ORAL_TABLET | Freq: Two times a day (BID) | ORAL | 1 refills | Status: DC
  Filled 2023-11-18: qty 60, 30d supply, fill #0

## 2023-11-18 NOTE — Progress Notes (Signed)
Mobility Specialist Progress Note:   11/18/23 0839  Mobility  Activity Ambulated with assistance in hallway  Level of Assistance Contact guard assist, steadying assist  Assistive Device None  Distance Ambulated (ft) 350 ft  Activity Response Tolerated well  Mobility Referral Yes  Mobility visit 1 Mobility  Mobility Specialist Start Time (ACUTE ONLY) [id number]  Mobility Specialist Stop Time (ACUTE ONLY) 0847  Mobility Specialist Time Calculation (min) (ACUTE ONLY) 8 min   Pt agreeable to mobility session. Ambulated in hallway with CGA, no AD. Tolerated well, VSS throughout. Returned pt to room, all needs met.    Feliciana Rossetti Mobility Specialist Please contact via Special educational needs teacher or  Rehab office at [phone]

## 2023-11-18 NOTE — Discharge Summary (Signed)
Physician Discharge Summary  [name] [name] QIO:[id number] DOB: January 10, 1961 DOA: 11/14/2023  PCP: Everardo All, NP  Admit date: 11/14/2023 Discharge date: 11/18/2023  Time spent: 60 minutes  Recommendations for Outpatient Follow-up:  Follow-up with Everardo All, NP in 2 weeks.  On follow-up patient will need a basic metabolic profile, magnesium level checked to follow-up on electrolytes and renal function.   Discharge Diagnoses:  Principal Problem:   Acute pancreatitis Active Problems:   Essential hypertension   Hypothyroidism   GERD   AKI (acute kidney injury) ([name])   Dehydration   Duodenitis   Gastroparesis   Gastritis and gastroduodenitis   Depression   Discharge Condition: Stable and improved  Diet recommendation: Carb modified diet  Filed Weights   11/14/23 1529 11/17/23 0406 11/18/23 0600  Weight: 57.4 kg 55.7 kg 54.8 kg    History of present illness:  [name] [name] is a 62 y.o. female with medical history significant of poorly controlled insulin dependent diabetes mellitus (HGB A1C >15.5% in September 2024 ), history of DKA, pancreatitis, IBS, HTN, HLD, hypertension, hypothyroidism, CKD stage II, gastroparesis, peptic ulcer disease. She had an EGD/EUS about ~1 month ago that showed severe LA grade D erosive esophagitis, gastritis and duodenal ulcer.  She was started on PPI and Carafate.  She had reported worsening generalized abdominal pain since EGD.  She has had monthly admissions since May of this year with nausea, vomiting, and abdominal pain playing some role in her reason for presenting to the emergency department.   Today she presents to the emergency department via EMS after an ED visit yesterday and discharge from the hospital 2 days ago reporting ongoing epigastric/abdominal pain, nausea, and vomiting.  She reports she has vomited twice since 5 AM this morning and that is why she presented to the ED. She describes the pain as 10 out of 10 aching and  intermittent burning.  On my evaluation the patient reports she has been eating and drinking fine but she reported decreased p.o. intake to the ED physician.She does endorse poor compliance with her insulin, normally taking less than prescribed, citing a hypoglycemic event back in November and fears that recurring overnight while she is home.  She denies EtOH use, diarrhea, constipation, dyspnea, palpitations, or radiation of the pain.   ED Course: On arrival to [facility], ED she was noted TempTo be afebrile 30 6.7C, BP 156/101, HR 103, RR 12, SpO2 100% on room air.  Labs notable for Lipase of 207, sodium 129, blood glucose elevated at 326, creatinine 1.22/BUN 25, and a slight increase in anion gap to 16.  VBG obtained pH 7.53, pCO2 37, pO2 67, bicarb 31, O2 saturation 95%.  UA collected and without evidence of UTI, does show glucosuria.  CT Abd pelvis done 3 days ago without evidence of pancreatitis, no imaging obtained today in ED.  Patient given IV Pepcid, IV fluid bolus, and Carafate.  TRH contacted for admission.  Hospital Course:  #1 acute pancreatitis -Patient presented with epigastric abdominal pain, nausea and vomiting, prior history of pancreatitis 09/14/2023 with similar symptoms. -CT abdomen and pelvis done 3 days prior to admission and as such was not repeat. -Patient placed on bowel rest, aggressive fluid resuscitation, pain management, antiemetics and improved clinically during the hospitalization. -Lipase levels trended down to 25 from 207 on admission.   -Patient tried on clear liquids which she tolerated, diet advanced to full liquid diet which she tolerated and subsequently to a carb modified diet.  -  Patient improved clinically, pain had resolved by day of discharge and patient was discharged in stable and improved condition.    2.  AKI -Likely secondary to prerenal azotemia in the setting of poor oral intake. -Resolved with hydration during the hospitalization.    3.   Insulin-dependent diabetes mellitus type 2, poorly controlled -Hemoglobin A1c > 15.5 (08/22/2023) -Patient admitted with an acute pancreatitis initially placed on bowel rest and CBGs monitored. -Patient started on clear liquids which she tolerated, diet advanced to full liquid diet which patient tolerated and subsequently to a carb modified diet..  -Patient noted to be on 12 units of Toujeo daily as well as Reglan prior to admission. -Semglee was held during the hospitalization patient maintained on sliding scale insulin.   -Patient while on bowel rest was placed on IV Reglan and as patient improved clinically and started oral intake was subsequently transition to home regimen of oral Reglan.   -Blood glucose levels remain controlled during the hospitalization.   -Patient be discharged back on home regimen long-acting Toujeo 12 units daily as well as Reglan with outpatient follow-up with PCP.    4.  Hypertension/elevated blood pressure -Patient not on antihypertensive medications prior to admission. -Blood pressure improved and stable with management of pain.  -Outpatient follow-up with PCP.   5.  Hypothyroidism -Patient maintained on home regimen Synthroid.   6.  Hyperlipidemia -Statin held during the hospitalization will be resumed on discharge.    7.  Duodenitis -Patient maintained on home regimen Carafate, PPI.   -Outpatient follow-up with GI as previously scheduled.    8.  Depression -Patient maintained on home regimen Lexapro.     9.  Hypokalemia -Repleted during the hospitalization.   10.  Metabolic acidosis -Patient remained afebrile. -CBGs within normal limits, anion gap closed. -Resolved on bicarb tablets.  -Outpatient follow-up.    Procedures: None  Consultations: None  Discharge Exam: Vitals:   11/18/23 0600 11/18/23 0725  BP: (!) 105/91 139/75  Pulse: 78 83  Resp: 18 17  Temp: 98.1 F (36.7 C) 98.3 F (36.8 C)  SpO2: 98% 99%    General: NAD   cardiovascular: RRR no murmurs rubs or gallops.  No JVD.  No lower extremity edema. Respiratory: Clear to auscultation bilaterally.  No wheezes, no crackles, no rhonchi.  Fair air movement.  Speaking in full sentences.  Discharge Instructions   Discharge Instructions     Diet Carb Modified   Complete by: As directed    Increase activity slowly   Complete by: As directed       Allergies as of 11/18/2023       Reactions   Codeine Hives, Anxiety, Other (See Comments)        Medication List     STOP taking these medications    midodrine 5 MG tablet Commonly known as: PROAMATINE   ranolazine 500 MG 12 hr tablet Commonly known as: RANEXA       TAKE these medications    aspirin EC 81 MG tablet Take 81 mg by mouth daily. Swallow whole.   azelastine 0.1 % nasal spray Commonly known as: ASTELIN Place 1 spray into both nostrils 2 (two) times daily.   Dexcom G6 Transmitter Misc USE AS DIRECTED FOR CONTINUOUS GLUCOSE MONITORING. REUSE TRANSMITTER X 90DAYS THEN DISCARD & REPLACE   escitalopram 10 MG tablet Commonly known as: LEXAPRO Take 1 tablet (10 mg total) by mouth daily.   Glucagon Emergency 1 MG Kit Inject 1 mg into the skin  See admin instructions. Inject one mg into the skin see administration instructions. Follow package directions for low blood sugar.   levothyroxine 137 MCG tablet Commonly known as: SYNTHROID Take 137 mcg by mouth daily before breakfast.   metoCLOPramide 5 MG tablet Commonly known as: Reglan Take 1 tablet (5 mg total) by mouth 3 (three) times daily before meals.   NovoLOG FlexPen 100 UNIT/ML FlexPen Generic drug: insulin aspart Inject 4 Units into the skin 3 (three) times daily with meals. Plus sliding scale TID   omega-3 acid ethyl esters 1 g capsule Commonly known as: LOVAZA Take 2 capsules (2 g total) by mouth 2 (two) times daily.   pantoprazole 40 MG tablet Commonly known as: Protonix Take 1 tablet (40 mg total) by mouth 2  (two) times daily.   QUEtiapine 50 MG tablet Commonly known as: SEROQUEL Take 50 mg by mouth at bedtime.   rosuvastatin 10 MG tablet Commonly known as: CRESTOR Take 1 tablet (10 mg total) by mouth daily.   sucralfate 1 GM/10ML suspension Commonly known as: CARAFATE Take 10 mLs (1 g total) by mouth 2 (two) times daily. Do not take any other medication within 2-4 hours   tamsulosin 0.4 MG Caps capsule Commonly known as: FLOMAX Take 1 capsule (0.4 mg total) by mouth daily after supper.   Toujeo SoloStar 300 UNIT/ML Solostar Pen Generic drug: insulin glargine (1 Unit Dial) Inject 25 Units into the skin at bedtime. What changed: how much to take   traZODone 50 MG tablet Commonly known as: DESYREL Take 50 mg by mouth at bedtime.       Allergies  Allergen Reactions   Codeine Hives, Anxiety and Other (See Comments)    Follow-up Information     Everardo All, NP. Schedule an appointment as soon as possible for a visit in 2 week(s).   Specialty: Family Medicine                 The results of significant diagnostics from this hospitalization (including imaging, microbiology, ancillary and laboratory) are listed below for reference.    Significant Diagnostic Studies: CT ABDOMEN PELVIS W CONTRAST  Result Date: 11/11/2023 CLINICAL DATA:  Acute nonlocalized upper abdominal pain. History of gastric ulcers. Nausea, vomiting, and diarrhea. EXAM: CT ABDOMEN AND PELVIS WITH CONTRAST TECHNIQUE: Multidetector CT imaging of the abdomen and pelvis was performed using the standard protocol following bolus administration of intravenous contrast. RADIATION DOSE REDUCTION: This exam was performed according to the departmental dose-optimization program which includes automated exposure control, adjustment of the mA and/or kV according to patient size and/or use of iterative reconstruction technique. CONTRAST:  80mL OMNIPAQUE IOHEXOL 300 MG/ML  SOLN COMPARISON:  CT 10/25/2023.  MRI  09/24/2023 FINDINGS: Lower chest: Lung bases are clear. Hepatobiliary: Mild diffuse fatty infiltration of the liver. Gallbladder and bile ducts are normal. Pancreas: Unremarkable. No pancreatic ductal dilatation or surrounding inflammatory changes. Spleen: Normal in size without focal abnormality. Adrenals/Urinary Tract: Adrenal glands are unremarkable. Kidneys are normal, without renal calculi, focal lesion, or hydronephrosis. Bladder is unremarkable. Stomach/Bowel: The stomach is fluid distended. This may be physiologic or could indicate dysmotility. There is evidence of wall thickening and edema around the duodenal bulb and pyloric region of the stomach. This is compatible with history of peptic ulcer disease. No evidence of perforation. Small bowel and colon are otherwise unremarkable. Appendix is normal. Vascular/Lymphatic: Aortic atherosclerosis. No enlarged abdominal or pelvic lymph nodes. Reproductive: Status post hysterectomy. No adnexal masses. Other: No abdominal wall hernia  or abnormality. No abdominopelvic ascites. Musculoskeletal: No acute or significant osseous findings. IMPRESSION: 1. Wall thickening and edema in the pylorus and duodenal bulb region consistent with history of peptic ulcer disease. No evidence of perforation. 2. Fatty infiltration of the liver. 3. Aortic atherosclerosis. Electronically Signed   By: Burman Nieves M.D.   On: 11/11/2023 21:10   CT ABDOMEN PELVIS W CONTRAST  Result Date: 10/25/2023 CLINICAL DATA:  Left lower quadrant pain and nausea beginning this morning. EXAM: CT ABDOMEN AND PELVIS WITH CONTRAST TECHNIQUE: Multidetector CT imaging of the abdomen and pelvis was performed using the standard protocol following bolus administration of intravenous contrast. RADIATION DOSE REDUCTION: This exam was performed according to the departmental dose-optimization program which includes automated exposure control, adjustment of the mA and/or kV according to patient size and/or  use of iterative reconstruction technique. CONTRAST:  OMNIPAQUE IOHEXOL 300 MG/ML  SOLN COMPARISON:  CT on 09/22/2023 FINDINGS: Lower Chest: No acute findings. Hepatobiliary: No suspicious hepatic masses identified. A focal enhancing nodular density is seen in the gallbladder fundus measuring approximately 1 cm. This remains stable since recent study, and could be due to gallbladder polyp/neoplasm or focal adenomyosis. No No evidence of biliary obstruction. Pancreas:  No mass or inflammatory changes. Spleen: Within normal limits in size and appearance. Adrenals/Urinary Tract: No suspicious masses identified. No evidence of ureteral calculi or hydronephrosis. Stomach/Bowel: No evidence of obstruction, inflammatory process or abnormal fluid collections. Normal appendix visualized. Diffuse colonic diverticulosis is again seen, without signs of diverticulitis. Vascular/Lymphatic: No pathologically enlarged lymph nodes. No acute vascular findings. Reproductive: Prior hysterectomy noted. Adnexal regions are unremarkable in appearance. Other:  Stable small umbilical hernia, which contains only fat. Musculoskeletal:  No suspicious bone lesions identified. IMPRESSION: No acute findings. Colonic diverticulosis, without radiographic evidence of diverticulitis. Stable 1 cm enhancing nodular density in gallbladder fundus, which could be due to gallbladder polyp/neoplasm or focal adenomyomatosis. Recommend abdomen ultrasound for further evaluation. Stable small umbilical hernia, which contains only fat. Electronically Signed   By: Danae Orleans M.D.   On: 10/25/2023 16:17    Microbiology: Recent Results (from the past 240 hour(s))  Resp panel by RT-PCR (RSV, Flu A&B, Covid) Anterior Nasal Swab     Status: None   Collection Time: 11/12/23  1:56 AM   Specimen: Anterior Nasal Swab  Result Value Ref Range Status   SARS Coronavirus 2 by RT PCR NEGATIVE NEGATIVE Final    Comment: (NOTE) SARS-CoV-2 target nucleic acids  are NOT DETECTED.  The SARS-CoV-2 RNA is generally detectable in upper respiratory specimens during the acute phase of infection. The lowest concentration of SARS-CoV-2 viral copies this assay can detect is 138 copies/mL. A negative result does not preclude SARS-Cov-2 infection and should not be used as the sole basis for treatment or other patient management decisions. A negative result may occur with  improper specimen collection/handling, submission of specimen other than nasopharyngeal swab, presence of viral mutation(s) within the areas targeted by this assay, and inadequate number of viral copies(<138 copies/mL). A negative result must be combined with clinical observations, patient history, and epidemiological information. The expected result is Negative.  Fact Sheet for Patients:  BloggerCourse.com  Fact Sheet for Healthcare Providers:  SeriousBroker.it  This test is no t yet approved or cleared by the [place] FDA and  has been authorized for detection and/or diagnosis of SARS-CoV-2 by FDA under an Emergency Use Authorization (EUA). This EUA will remain  in effect (meaning this test can be used) for  the duration of the COVID-19 declaration under Section 564(b)(1) of the Act, 21 U.S.C.section 360bbb-3(b)(1), unless the authorization is terminated  or revoked sooner.       Influenza A by PCR NEGATIVE NEGATIVE Final   Influenza B by PCR NEGATIVE NEGATIVE Final    Comment: (NOTE) The Xpert Xpress SARS-CoV-2/FLU/RSV plus assay is intended as an aid in the diagnosis of influenza from Nasopharyngeal swab specimens and should not be used as a sole basis for treatment. Nasal washings and aspirates are unacceptable for Xpert Xpress SARS-CoV-2/FLU/RSV testing.  Fact Sheet for Patients: BloggerCourse.com  Fact Sheet for Healthcare Providers: SeriousBroker.it  This test is  not yet approved or cleared by the [place] FDA and has been authorized for detection and/or diagnosis of SARS-CoV-2 by FDA under an Emergency Use Authorization (EUA). This EUA will remain in effect (meaning this test can be used) for the duration of the COVID-19 declaration under Section 564(b)(1) of the Act, 21 U.S.C. section 360bbb-3(b)(1), unless the authorization is terminated or revoked.     Resp Syncytial Virus by PCR NEGATIVE NEGATIVE Final    Comment: (NOTE) Fact Sheet for Patients: BloggerCourse.com  Fact Sheet for Healthcare Providers: SeriousBroker.it  This test is not yet approved or cleared by the [place] FDA and has been authorized for detection and/or diagnosis of SARS-CoV-2 by FDA under an Emergency Use Authorization (EUA). This EUA will remain in effect (meaning this test can be used) for the duration of the COVID-19 declaration under Section 564(b)(1) of the Act, 21 U.S.C. section 360bbb-3(b)(1), unless the authorization is terminated or revoked.  Performed at [facility], [address] [place], [place] [address]   Urine Culture     Status: None   Collection Time: 11/13/23  8:27 AM   Specimen: Urine, Clean Catch  Result Value Ref Range Status   Specimen Description   Final    URINE, CLEAN CATCH Performed at [facility], [address] [place], [place] [address]    Special Requests   Final    NONE Performed at [facility], [address], [place], [place] [address]    Culture   Final    NO GROWTH Performed at [facility], [address] [place], [place] [address]    Report Status 11/14/2023 FINAL  Final     Labs: Basic Metabolic Panel: Recent Labs  Lab 11/14/23 1737 11/15/23 0528 11/16/23 0535 11/17/23 0704 11/18/23 0700  NA 139 138 135 136 133*  K 3.2* 3.4* 4.8 4.3 4.1  CL 106 108 109 104 100  CO2 23 19* 15* 25  25  GLUCOSE 91 64* 97 153* 173*  BUN 14 13 5* <5* 10  CREATININE 0.65 0.69 0.57 0.74 0.92  CALCIUM 8.2* 7.6* 7.6* 8.3* 8.5*  MG  --  1.7 2.6*  --   --    Liver Function Tests: Recent Labs  Lab 11/11/23 1640 11/14/23 0809 11/15/23 0528  AST 15 20 17   ALT 17 12 11   ALKPHOS 80 69 47  BILITOT 0.8 1.0 0.8  PROT 8.2* 7.1 5.5*  ALBUMIN 4.6 3.9 3.0*   Recent Labs  Lab 11/11/23 1640 11/14/23 0809 11/15/23 0528  LIPASE 84* 207* 25   No results for input(s): "AMMONIA" in the last 168 hours. CBC: Recent Labs  Lab 11/11/23 1640 11/12/23 0510 11/14/23 0809 11/14/23 0826 11/15/23 0528 11/16/23 0535  WBC 12.8* 10.0 9.5  --  8.0 7.9  NEUTROABS 11.3*  --  6.6  --   --   --   HGB 14.8 13.4 14.9 15.6* 12.2 13.9  HCT 42.8 39.6 46.1* 46.0 36.5 43.0  MCV 85.1 85.7 86.3  --  85.3 87.4  PLT 379 309 377  --  322 369   Cardiac Enzymes: No results for input(s): "CKTOTAL", "CKMB", "CKMBINDEX", "TROPONINI" in the last 168 hours. BNP: BNP (last 3 results) No results for input(s): "BNP" in the last 8760 hours.  ProBNP (last 3 results) No results for input(s): "PROBNP" in the last 8760 hours.  CBG: Recent Labs  Lab 11/17/23 0812 11/17/23 1030 11/17/23 1628 11/17/23 2122 11/18/23 0724  GLUCAP 166* 206* 104* 173* 173*       Signed:  Ramiro Harvest MD.  [facility] 11/18/2023, 10:41 AM

## 2023-11-18 NOTE — Telephone Encounter (Signed)
Pharmacy Patient Advocate Encounter   Received notification that prior authorization for Pantoprazole Sodium 40MG  dr tablets is required/requested.   Insurance verification completed.   The patient is insured through [facility] .   Per test claim: PA required; PA submitted to above mentioned insurance via CoverMyMeds Key/confirmation #/EOC [id number] Status is pending

## 2023-11-18 NOTE — Telephone Encounter (Signed)
Pharmacy Patient Advocate Encounter  Received notification from [facility] that Prior Authorization for Pantoprazole Sodium 40MG  dr tablets  has been APPROVED from 11/18/2023 to 11/17/2024   PA #/Case ID/Reference #: [id number]

## 2023-11-18 NOTE — Plan of Care (Signed)

## 2023-11-26 DIAGNOSIS — R0789 Other chest pain: Secondary | ICD-10-CM | POA: Diagnosis not present

## 2023-11-26 DIAGNOSIS — I10 Essential (primary) hypertension: Secondary | ICD-10-CM | POA: Diagnosis not present

## 2023-12-07 ENCOUNTER — Emergency Department (HOSPITAL_COMMUNITY): Payer: BC Managed Care – PPO

## 2023-12-07 ENCOUNTER — Observation Stay (HOSPITAL_COMMUNITY)
Admission: EM | Admit: 2023-12-07 | Discharge: 2023-12-08 | Disposition: A | Discharge status: Home / Self Care | Payer: BC Managed Care – PPO | Attending: Emergency Medicine | Admitting: Emergency Medicine

## 2023-12-07 ENCOUNTER — Other Ambulatory Visit: Payer: Self-pay

## 2023-12-07 DIAGNOSIS — E1065 Type 1 diabetes mellitus with hyperglycemia: Secondary | ICD-10-CM | POA: Diagnosis not present

## 2023-12-07 DIAGNOSIS — K2980 Duodenitis without bleeding: Secondary | ICD-10-CM | POA: Insufficient documentation

## 2023-12-07 DIAGNOSIS — N182 Chronic kidney disease, stage 2 (mild): Secondary | ICD-10-CM | POA: Diagnosis not present

## 2023-12-07 DIAGNOSIS — I129 Hypertensive chronic kidney disease with stage 1 through stage 4 chronic kidney disease, or unspecified chronic kidney disease: Secondary | ICD-10-CM | POA: Insufficient documentation

## 2023-12-07 DIAGNOSIS — R0789 Other chest pain: Secondary | ICD-10-CM | POA: Diagnosis present

## 2023-12-07 DIAGNOSIS — S2220XA Unspecified fracture of sternum, initial encounter for closed fracture: Secondary | ICD-10-CM | POA: Diagnosis not present

## 2023-12-07 DIAGNOSIS — F419 Anxiety disorder, unspecified: Secondary | ICD-10-CM | POA: Diagnosis not present

## 2023-12-07 DIAGNOSIS — E039 Hypothyroidism, unspecified: Secondary | ICD-10-CM | POA: Diagnosis not present

## 2023-12-07 DIAGNOSIS — Z7901 Long term (current) use of anticoagulants: Secondary | ICD-10-CM | POA: Diagnosis not present

## 2023-12-07 DIAGNOSIS — Z79899 Other long term (current) drug therapy: Secondary | ICD-10-CM | POA: Diagnosis not present

## 2023-12-07 DIAGNOSIS — Z87891 Personal history of nicotine dependence: Secondary | ICD-10-CM | POA: Insufficient documentation

## 2023-12-07 DIAGNOSIS — F331 Major depressive disorder, recurrent, moderate: Secondary | ICD-10-CM | POA: Diagnosis not present

## 2023-12-07 DIAGNOSIS — W19XXXA Unspecified fall, initial encounter: Secondary | ICD-10-CM | POA: Diagnosis not present

## 2023-12-07 DIAGNOSIS — E1010 Type 1 diabetes mellitus with ketoacidosis without coma: Secondary | ICD-10-CM | POA: Diagnosis not present

## 2023-12-07 DIAGNOSIS — R339 Retention of urine, unspecified: Secondary | ICD-10-CM | POA: Diagnosis present

## 2023-12-07 DIAGNOSIS — Z8659 Personal history of other mental and behavioral disorders: Secondary | ICD-10-CM | POA: Insufficient documentation

## 2023-12-07 DIAGNOSIS — E109 Type 1 diabetes mellitus without complications: Secondary | ICD-10-CM | POA: Diagnosis present

## 2023-12-07 DIAGNOSIS — E1022 Type 1 diabetes mellitus with diabetic chronic kidney disease: Secondary | ICD-10-CM | POA: Insufficient documentation

## 2023-12-07 LAB — CBG MONITORING, ED
Glucose-Capillary: 392 mg/dL — ABNORMAL HIGH (ref 70–99)
Glucose-Capillary: 267 mg/dL — ABNORMAL HIGH (ref 70–99)
Glucose-Capillary: 206 mg/dL — ABNORMAL HIGH (ref 70–99)
Glucose-Capillary: 190 mg/dL — ABNORMAL HIGH (ref 70–99)
Glucose-Capillary: 164 mg/dL — ABNORMAL HIGH (ref 70–99)
Glucose-Capillary: 163 mg/dL — ABNORMAL HIGH (ref 70–99)
Glucose-Capillary: 129 mg/dL — ABNORMAL HIGH (ref 70–99)
Glucose-Capillary: 130 mg/dL — ABNORMAL HIGH (ref 70–99)
Glucose-Capillary: 144 mg/dL — ABNORMAL HIGH (ref 70–99)
Glucose-Capillary: 160 mg/dL — ABNORMAL HIGH (ref 70–99)
Glucose-Capillary: 178 mg/dL — ABNORMAL HIGH (ref 70–99)
Glucose-Capillary: 139 mg/dL — ABNORMAL HIGH (ref 70–99)

## 2023-12-07 LAB — I-STAT VENOUS BLOOD GAS, ED
Acid-Base Excess: 9 mmol/L — ABNORMAL HIGH (ref 0.0–2.0)
Bicarbonate: 33.4 mmol/L — ABNORMAL HIGH (ref 20.0–28.0)
Calcium, Ion: 1.06 mmol/L — ABNORMAL LOW (ref 1.15–1.40)
HCT: 42 % (ref 36.0–46.0)
Hemoglobin: 14.3 g/dL (ref 12.0–15.0)
O2 Saturation: 94 %
Potassium: 3.5 mmol/L (ref 3.5–5.1)
Sodium: 134 mmol/L — ABNORMAL LOW (ref 135–145)
TCO2: 35 mmol/L — ABNORMAL HIGH (ref 22–32)
pCO2, Ven: 41.2 mm[Hg] — ABNORMAL LOW (ref 44–60)
pH, Ven: 7.517 — ABNORMAL HIGH (ref 7.25–7.43)
pO2, Ven: 62 mm[Hg] — ABNORMAL HIGH (ref 32–45)

## 2023-12-07 LAB — BASIC METABOLIC PANEL
Anion gap: 19 — ABNORMAL HIGH (ref 5–15)
Anion gap: 18 — ABNORMAL HIGH (ref 5–15)
Anion gap: 12 (ref 5–15)
Anion gap: 13 (ref 5–15)
Anion gap: 7 (ref 5–15)
BUN: 17 mg/dL (ref 6–20)
BUN: 19 mg/dL (ref 6–20)
BUN: 14 mg/dL (ref 6–20)
BUN: 12 mg/dL (ref 6–20)
BUN: 13 mg/dL (ref 6–20)
CO2: 24 mmol/L (ref 22–32)
CO2: 26 mmol/L (ref 22–32)
CO2: 30 mmol/L (ref 22–32)
CO2: 28 mmol/L (ref 22–32)
CO2: 28 mmol/L (ref 22–32)
Calcium: 10 mg/dL (ref 8.9–10.3)
Calcium: 9.7 mg/dL (ref 8.9–10.3)
Calcium: 9 mg/dL (ref 8.9–10.3)
Calcium: 9 mg/dL (ref 8.9–10.3)
Calcium: 8.7 mg/dL — ABNORMAL LOW (ref 8.9–10.3)
Chloride: 92 mmol/L — ABNORMAL LOW (ref 98–111)
Chloride: 88 mmol/L — ABNORMAL LOW (ref 98–111)
Chloride: 93 mmol/L — ABNORMAL LOW (ref 98–111)
Chloride: 94 mmol/L — ABNORMAL LOW (ref 98–111)
Chloride: 98 mmol/L (ref 98–111)
Creatinine, Ser: 0.94 mg/dL (ref 0.44–1.00)
Creatinine, Ser: 0.86 mg/dL (ref 0.44–1.00)
Creatinine, Ser: 0.78 mg/dL (ref 0.44–1.00)
Creatinine, Ser: 0.64 mg/dL (ref 0.44–1.00)
Creatinine, Ser: 0.63 mg/dL (ref 0.44–1.00)
GFR, Estimated: >60 mL/min (ref >60)
GFR, Estimated: >60 mL/min (ref >60)
GFR, Estimated: >60 mL/min (ref >60)
GFR, Estimated: >60 mL/min (ref >60)
GFR, Estimated: >60 mL/min (ref >60)
Glucose, Bld: 471 mg/dL — ABNORMAL HIGH (ref 70–99)
Glucose, Bld: 432 mg/dL — ABNORMAL HIGH (ref 70–99)
Glucose, Bld: 244 mg/dL — ABNORMAL HIGH (ref 70–99)
Glucose, Bld: 205 mg/dL — ABNORMAL HIGH (ref 70–99)
Glucose, Bld: 147 mg/dL — ABNORMAL HIGH (ref 70–99)
Potassium: 3.3 mmol/L — ABNORMAL LOW (ref 3.5–5.1)
Potassium: 3.6 mmol/L (ref 3.5–5.1)
Potassium: 3.5 mmol/L (ref 3.5–5.1)
Potassium: 3.8 mmol/L (ref 3.5–5.1)
Potassium: 3.7 mmol/L (ref 3.5–5.1)
Sodium: 135 mmol/L (ref 135–145)
Sodium: 132 mmol/L — ABNORMAL LOW (ref 135–145)
Sodium: 135 mmol/L (ref 135–145)
Sodium: 135 mmol/L (ref 135–145)
Sodium: 133 mmol/L — ABNORMAL LOW (ref 135–145)

## 2023-12-07 LAB — CBC
HCT: 48.6 % — ABNORMAL HIGH (ref 36.0–46.0)
Hemoglobin: 15.9 g/dL — ABNORMAL HIGH (ref 12.0–15.0)
MCH: 27.9 pg (ref 26.0–34.0)
MCHC: 32.7 g/dL (ref 30.0–36.0)
MCV: 85.4 fL (ref 80.0–100.0)
Platelets: 450 10*3/uL — ABNORMAL HIGH (ref 150–400)
RBC: 5.69 MIL/uL — ABNORMAL HIGH (ref 3.87–5.11)
RDW: 14.8 % (ref 11.5–15.5)
WBC: 14.4 10*3/uL — ABNORMAL HIGH (ref 4.0–10.5)
nRBC: 0 % (ref 0.0–0.2)

## 2023-12-07 LAB — BETA-HYDROXYBUTYRIC ACID
Beta-Hydroxybutyric Acid: 1.79 mmol/L — ABNORMAL HIGH (ref 0.05–0.27)
Beta-Hydroxybutyric Acid: 0.34 mmol/L — ABNORMAL HIGH (ref 0.05–0.27)

## 2023-12-07 LAB — TROPONIN I (HIGH SENSITIVITY)
Troponin I (High Sensitivity): 4 ng/L (ref <18)
Troponin I (High Sensitivity): 3 ng/L (ref <18)

## 2023-12-07 LAB — LIPASE, BLOOD: Lipase: 28 U/L (ref 11–51)

## 2023-12-07 MED ORDER — LACTATED RINGERS IV SOLN: INTRAVENOUS | Status: AC

## 2023-12-07 MED ORDER — INSULIN REGULAR(HUMAN) IN NACL 100-0.9 UT/100ML-% IV SOLN: INTRAVENOUS | Status: DC

## 2023-12-07 MED ORDER — ROSUVASTATIN CALCIUM 5 MG PO TABS
10.0000 mg | ORAL_TABLET | Freq: Every day | ORAL | Status: DC
  Administered 2023-12-07 – 2023-12-08 (×2): 10 mg via ORAL
  Filled 2023-12-07 (×2): qty 2

## 2023-12-07 MED ORDER — ONDANSETRON HCL 4 MG/2ML IJ SOLN
4.0000 mg | Freq: Once | INTRAMUSCULAR | Status: AC
  Administered 2023-12-07: 4 mg via INTRAVENOUS
  Filled 2023-12-07: qty 2

## 2023-12-07 MED ORDER — POTASSIUM CHLORIDE 10 MEQ/100ML IV SOLN
10.0000 meq | INTRAVENOUS | Status: AC
Start: 2023-12-07 — End: 2023-12-07
  Administered 2023-12-07 (×4): 10 meq via INTRAVENOUS
  Filled 2023-12-07 (×4): qty 100

## 2023-12-07 MED ORDER — HYDROMORPHONE HCL 1 MG/ML IJ SOLN
1.0000 mg | Freq: Once | INTRAMUSCULAR | Status: AC
  Administered 2023-12-07: 1 mg via INTRAVENOUS
  Filled 2023-12-07: qty 1

## 2023-12-07 MED ORDER — HYDROMORPHONE HCL 1 MG/ML IJ SOLN
0.5000 mg | Freq: Once | INTRAMUSCULAR | Status: AC
  Administered 2023-12-07: 0.5 mg via INTRAVENOUS
  Filled 2023-12-07: qty 1

## 2023-12-07 MED ORDER — ONDANSETRON 4 MG PO TBDP
4.0000 mg | ORAL_TABLET | Freq: Three times a day (TID) | ORAL | Status: DC | PRN
  Filled 2023-12-07 (×2): qty 1

## 2023-12-07 MED ORDER — LEVOTHYROXINE SODIUM 25 MCG PO TABS
137.0000 ug | ORAL_TABLET | Freq: Every day | ORAL | Status: DC
Start: 2023-12-08 — End: 2023-12-08
  Administered 2023-12-08: 137 ug via ORAL
  Filled 2023-12-07: qty 1

## 2023-12-07 MED ORDER — OXYCODONE-ACETAMINOPHEN 5-325 MG PO TABS
1.0000 | ORAL_TABLET | Freq: Once | ORAL | Status: AC
  Administered 2023-12-07: 1 via ORAL
  Filled 2023-12-07: qty 1

## 2023-12-07 MED ORDER — SUCRALFATE 1 G PO TABS
1.0000 g | ORAL_TABLET | Freq: Two times a day (BID) | ORAL | Status: DC
  Administered 2023-12-07 – 2023-12-08 (×3): 1 g via ORAL
  Filled 2023-12-07 (×3): qty 1

## 2023-12-07 MED ORDER — DEXTROSE IN LACTATED RINGERS 5 % IV SOLN: INTRAVENOUS | Status: AC

## 2023-12-07 MED ORDER — ONDANSETRON HCL 4 MG/2ML IJ SOLN
4.0000 mg | Freq: Three times a day (TID) | INTRAMUSCULAR | Status: DC | PRN
  Administered 2023-12-07: 4 mg via INTRAVENOUS
  Filled 2023-12-07: qty 2

## 2023-12-07 MED ORDER — ASPIRIN 81 MG PO TBEC
81.0000 mg | DELAYED_RELEASE_TABLET | Freq: Every day | ORAL | Status: DC
  Administered 2023-12-07 – 2023-12-08 (×2): 81 mg via ORAL
  Filled 2023-12-07 (×2): qty 1

## 2023-12-07 MED ORDER — DEXTROSE 50 % IV SOLN: 0.0000 mL | INTRAVENOUS | Status: DC | PRN

## 2023-12-07 MED ORDER — PROCHLORPERAZINE EDISYLATE 10 MG/2ML IJ SOLN
5.0000 mg | Freq: Once | INTRAMUSCULAR | Status: AC
  Administered 2023-12-07: 5 mg via INTRAVENOUS
  Filled 2023-12-07: qty 2

## 2023-12-07 MED ORDER — TRAZODONE HCL 50 MG PO TABS
50.0000 mg | ORAL_TABLET | Freq: Every day | ORAL | Status: DC
  Administered 2023-12-07: 50 mg via ORAL
  Filled 2023-12-07: qty 1

## 2023-12-07 MED ORDER — TAMSULOSIN HCL 0.4 MG PO CAPS
0.4000 mg | ORAL_CAPSULE | Freq: Every day | ORAL | Status: DC
  Administered 2023-12-07: 0.4 mg via ORAL
  Filled 2023-12-07: qty 1

## 2023-12-07 MED ORDER — ENOXAPARIN SODIUM 40 MG/0.4ML IJ SOSY
40.0000 mg | PREFILLED_SYRINGE | INTRAMUSCULAR | Status: DC
  Administered 2023-12-07: 40 mg via SUBCUTANEOUS
  Filled 2023-12-07: qty 0.4

## 2023-12-07 MED ORDER — PANTOPRAZOLE SODIUM 40 MG PO TBEC: 40.0000 mg | DELAYED_RELEASE_TABLET | Freq: Every day | ORAL | Status: DC

## 2023-12-07 MED ORDER — INSULIN GLARGINE-YFGN 100 UNIT/ML ~~LOC~~ SOLN
12.0000 [IU] | Freq: Every day | SUBCUTANEOUS | Status: DC
  Administered 2023-12-07 – 2023-12-08 (×2): 12 [IU] via SUBCUTANEOUS
  Filled 2023-12-07 (×2): qty 0.12

## 2023-12-07 MED ORDER — LIDOCAINE 5 % EX PTCH
1.0000 | MEDICATED_PATCH | CUTANEOUS | Status: DC
  Administered 2023-12-07: 1 via TRANSDERMAL
  Filled 2023-12-07 (×2): qty 1

## 2023-12-07 MED ORDER — ESCITALOPRAM OXALATE 10 MG PO TABS
10.0000 mg | ORAL_TABLET | Freq: Every day | ORAL | Status: DC
  Administered 2023-12-07 – 2023-12-08 (×2): 10 mg via ORAL
  Filled 2023-12-07 (×2): qty 1

## 2023-12-07 MED ORDER — PANTOPRAZOLE SODIUM 40 MG PO TBEC
40.0000 mg | DELAYED_RELEASE_TABLET | Freq: Two times a day (BID) | ORAL | Status: DC
  Administered 2023-12-08: 40 mg via ORAL
  Filled 2023-12-07: qty 1

## 2023-12-07 MED ORDER — SODIUM CHLORIDE 0.9 % IV BOLUS: 1000.0000 mL | Freq: Once | INTRAVENOUS | Status: DC

## 2023-12-07 MED ORDER — ONDANSETRON 4 MG PO TBDP
4.0000 mg | ORAL_TABLET | Freq: Once | ORAL | Status: AC
  Administered 2023-12-07: 4 mg via ORAL
  Filled 2023-12-07: qty 1

## 2023-12-07 MED ORDER — IOHEXOL 350 MG/ML SOLN
75.0000 mL | Freq: Once | INTRAVENOUS | Status: AC | PRN
  Administered 2023-12-07: 75 mL via INTRAVENOUS

## 2023-12-07 MED ORDER — INSULIN REGULAR(HUMAN) IN NACL 100-0.9 UT/100ML-% IV SOLN
INTRAVENOUS | Status: DC
  Administered 2023-12-07: 6.5 [IU]/h via INTRAVENOUS
  Filled 2023-12-07: qty 100

## 2023-12-07 MED ORDER — MORPHINE SULFATE (PF) 4 MG/ML IV SOLN
4.0000 mg | Freq: Once | INTRAVENOUS | Status: AC
  Administered 2023-12-07: 4 mg via INTRAVENOUS
  Filled 2023-12-07: qty 1

## 2023-12-07 MED ORDER — INSULIN ASPART 100 UNIT/ML IJ SOLN
0.0000 [IU] | Freq: Three times a day (TID) | INTRAMUSCULAR | Status: DC
  Administered 2023-12-08: 3 [IU] via SUBCUTANEOUS

## 2023-12-07 MED ORDER — QUETIAPINE FUMARATE 25 MG PO TABS
50.0000 mg | ORAL_TABLET | Freq: Every day | ORAL | Status: DC
  Administered 2023-12-07: 50 mg via ORAL
  Filled 2023-12-07: qty 2

## 2023-12-07 MED ORDER — LACTATED RINGERS IV BOLUS
1000.0000 mL | Freq: Once | INTRAVENOUS | Status: AC
  Administered 2023-12-07: 1000 mL via INTRAVENOUS

## 2023-12-07 NOTE — ED Notes (Signed)
Got patient on the monitor did EKG shown t provider patient is resting with call bell in reach

## 2023-12-07 NOTE — ED Notes (Signed)
Pt transported to CT ?

## 2023-12-07 NOTE — H&P (Signed)
History and Physical    Patient: [name] [name] ZOX:[id number] DOB: 1961-09-27 DOA: 12/07/2023 DOS: the patient was seen and examined on 12/07/2023 PCP: Everardo All, NP  Patient coming from: Home  Chief Complaint:  Chief Complaint  Patient presents with   Chest Pain    Hx. CHF   HPI: [name] [name] is a 62 y.o. female with medical history significant of poorly controlled insulin-dependent diabetes mellitis, history of DKA, gastroparesis, pancreatitis, hyperlipidemia, hypothyroidism, CKD stage II, duodenitis, depression presenting initially with chest pain but found to be in DKA.  Patient initially presented to [facility] ED due to midsternal chest pain.  She was noted to have a nondisplaced upper sternal fracture on films on 12/19.  This was done in the outpatient setting by her primary care doctor.  She is continue to experience midsternal chest pain around this location since that time.  In the ED, patient with negative troponins x 2.  EKG with sinus tachycardia but no active ischemic changes noted.  CT PE study without any pulmonary embolism and showing healing nondisplaced fracture of upper sternum.  ED course: During initial ED evaluation, patient noted to be in DKA which is the actual reason for admission given that her chest pain does not appear to be ACS.  Initial ED vitals stable.  BMP showing glucose 471, potassium 3.3, anion gap 19, kidney function appears to be at baseline.  CBC with all cell lines elevated, appears to be hemoconcentration.  Beta hydroxybutyric acid level elevated at 1.79.  Lipase normal.   Patient states that she is on long-term insulin for management of her diabetes.  She reports taking Toujeo 12 units nightly and NovoLog 4 units 3 times daily with meals at home.  She does report that her sugar levels have been sustaining in the upper 200s at home.  She has had several hypoglycemic events in the past and thus she is scared to give herself higher doses of  insulin at home.  She was previously taking Toujeo 25 units nightly and was also on sliding scale additional to her typical 4 units NovoLog.  However she is no longer taking sliding scale insulin and has Haft her Toujeo dose.   Review of Systems: As mentioned in the history of present illness. All other systems reviewed and are negative. Past Medical History:  Diagnosis Date   Allergy    seasonal   Anxiety    B12 deficiency    Chest pain 04/05/2017   CHF (congestive heart failure) ([name])    Diabetes mellitus type 1, uncontrolled    "dr. 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) 10/29/2013   Leg cramping    "@ night" (09/18/2013)   Migraine    "once q couple months" (11/26/2018)   Osteoporosis    Peripheral neuropathy    Umbilical hernia    unrepaired (09/18/2013)   Past Surgical History:  Procedure Laterality Date   BIOPSY  06/03/2019   Procedure: BIOPSY;  Surgeon: Lynann Bologna, MD;  Location: [facility];  Service: Endoscopy;;   BIOPSY  08/17/2020   Procedure: BIOPSY;  Surgeon: Tressia Danas, MD;  Location: [facility];  Service: Gastroenterology;;   BIOPSY  02/05/2023   Procedure: BIOPSY;  Surgeon: Meryl Dare, MD;  Location: [facility];  Service: Gastroenterology;;   BIOPSY  09/24/2023   Procedure: BIOPSY;  Surgeon: Lemar Lofty., MD;  Location: [facility];  Service: Gastroenterology;;   CESAREAN SECTION  1989   ENTEROSCOPY N/A 06/03/2019   Procedure: ENTEROSCOPY;  Surgeon: Lynann Bologna, MD;  Location: [facility];  Service: Endoscopy;  Laterality: N/A;   ESOPHAGOGASTRODUODENOSCOPY (EGD) WITH PROPOFOL N/A 08/17/2020   Procedure: ESOPHAGOGASTRODUODENOSCOPY (EGD) WITH PROPOFOL;  Surgeon: Tressia Danas, MD;  Location: [facility];  Service: Gastroenterology;  Laterality: N/A;   ESOPHAGOGASTRODUODENOSCOPY (EGD) WITH PROPOFOL N/A 02/05/2023   Procedure: ESOPHAGOGASTRODUODENOSCOPY (EGD) WITH PROPOFOL;  Surgeon: Meryl Dare, MD;  Location: [facility];  Service: Gastroenterology;  Laterality: N/A;   ESOPHAGOGASTRODUODENOSCOPY (EGD) WITH PROPOFOL N/A 09/24/2023   Procedure: ESOPHAGOGASTRODUODENOSCOPY (EGD) WITH PROPOFOL;  Surgeon: Meridee Score Netty Starring., MD;  Location: [facility];  Service: Gastroenterology;  Laterality: N/A;   EUS N/A 09/24/2023   Procedure: UPPER ENDOSCOPIC ULTRASOUND (EUS) LINEAR;  Surgeon: Lemar Lofty., MD;  Location: [facility];  Service: Gastroenterology;  Laterality: N/A;   LAPAROSCOPIC ASSISTED VAGINAL HYSTERECTOMY  09/23/2012   Procedure: LAPAROSCOPIC ASSISTED VAGINAL HYSTERECTOMY;  Surgeon: Mitchel Honour, DO;  Location: [facility];  Service: Gynecology;  Laterality: N/A;  pull Dr Vance Gather instrument   LEFT HEART CATH AND CORONARY ANGIOGRAPHY N/A 02/11/2017   Procedure: Left Heart Cath and Coronary Angiography;  Surgeon: Runell Gess, MD;  Location: [facility] INVASIVE CV LAB;  Service: Cardiovascular;  Laterality: N/A;   TUBAL LIGATION Bilateral 1992   Social History:  reports that she quit smoking about 33 years ago. Her smoking use included cigarettes. She started smoking about 43 years ago. She has a 1.2 pack-year smoking history. She has never used smokeless tobacco. She reports that she does not currently use drugs after having used the following drugs: Marijuana. She reports that she does not drink alcohol.  Allergies  Allergen Reactions   Codeine Hives, Anxiety and Other (See Comments)    Family History  Problem Relation Age of Onset   Diabetes Other    Heart disease Other    Heart failure Mother 27       Her heart skipped a beat and stopped - per pt   Diabetes Maternal Grandmother    Alzheimer's disease Maternal Grandmother    Coronary  artery disease Maternal Grandmother    Heart attack Maternal Grandfather    Heart failure Brother    Diabetes Brother    Colon polyps Neg Hx    Esophageal cancer Neg Hx    Liver cancer Neg Hx    Pancreatic cancer Neg Hx    Stomach cancer Neg Hx    Crohn's disease Neg Hx    Rectal cancer Neg Hx     Prior to Admission medications   Medication Sig Start Date End Date Taking? Authorizing Provider  aspirin EC 81 MG tablet Take 81 mg by mouth daily. Swallow whole.    [provider]  azelastine (ASTELIN) 0.1 % nasal spray Place 1 spray into both nostrils 2 (two) times daily. 11/04/23   [provider]  Continuous Blood Gluc Transmit (DEXCOM G6 TRANSMITTER) MISC USE AS DIRECTED FOR CONTINUOUS GLUCOSE MONITORING. REUSE TRANSMITTER X 90DAYS THEN DISCARD & REPLACE 07/05/21   Bedsole, Amy E, MD  escitalopram (LEXAPRO) 10 MG tablet Take 1 tablet (10 mg total) by mouth daily. 11/18/23   Rodolph Bong, MD  Glucagon, rDNA, (GLUCAGON EMERGENCY) 1 MG KIT Inject 1 mg into  the skin See admin instructions. Inject one mg into the skin see administration instructions. Follow package directions for low blood sugar. 06/18/23   [provider]  levothyroxine (SYNTHROID) 137 MCG tablet Take 137 mcg by mouth daily before breakfast. 03/19/22   [provider]  metoCLOPramide (REGLAN) 5 MG tablet Take 1 tablet (5 mg total) by mouth 3 (three) times daily before meals. 11/12/23   Danford, Earl Lites, MD  NOVOLOG FLEXPEN 100 UNIT/ML FlexPen Inject 4 Units into the skin 3 (three) times daily with meals. Plus sliding scale TID    [provider]  omega-3 acid ethyl esters (LOVAZA) 1 g capsule Take 2 capsules (2 g total) by mouth 2 (two) times daily. 09/13/23   Alwyn Ren, MD  pantoprazole (PROTONIX) 40 MG tablet Take 1 tablet (40 mg total) by mouth 2 (two) times daily. 11/18/23   Rodolph Bong, MD  QUEtiapine (SEROQUEL) 50 MG tablet Take 50 mg by mouth at bedtime.  09/09/23   [provider]  rosuvastatin (CRESTOR) 10 MG tablet Take 1 tablet (10 mg total) by mouth daily. 11/18/23   Rodolph Bong, MD  sucralfate (CARAFATE) 1 g tablet Take 1 tablet (1 g total) by mouth 2 (two) times daily. Do not take any other medication within 2-4 hours. 11/18/23   Rodolph Bong, MD  tamsulosin (FLOMAX) 0.4 MG CAPS capsule Take 1 capsule (0.4 mg total) by mouth daily after supper. 09/04/23   Arnetha Courser, MD  TOUJEO SOLOSTAR 300 UNIT/ML Solostar Pen Inject 25 Units into the skin at bedtime. Patient taking differently: Inject 12 Units into the skin at bedtime. 10/28/23   Almon Hercules, MD  traZODone (DESYREL) 50 MG tablet Take 50 mg by mouth at bedtime.    [provider]    Physical Exam: Vitals:   12/07/23 1100 12/07/23 1215 12/07/23 1245 12/07/23 1309  BP: (!) 155/98 (!) 148/90 125/77   Pulse: 99 99 98   Resp:  13 13   Temp:    98.3 F (36.8 C)  TempSrc:    Oral  SpO2: 100% 100% 100%    Physical Exam Constitutional:      Appearance: She is well-developed. She is not ill-appearing.  HENT:     Head: Normocephalic and atraumatic.     Mouth/Throat:     Mouth: Mucous membranes are dry.     Pharynx: Oropharynx is clear. No oropharyngeal exudate.  Eyes:     General: No scleral icterus.    Extraocular Movements: Extraocular movements intact.     Pupils: Pupils are equal, round, and reactive to light.  Cardiovascular:     Rate and Rhythm: Normal rate and regular rhythm.     Pulses: Normal pulses.     Heart sounds: Normal heart sounds. No murmur heard.    No friction rub. No gallop.     Comments: Mid-sternal tenderness to light palpation, reproducible Pulmonary:     Effort: Pulmonary effort is normal.     Breath sounds: Normal breath sounds. No wheezing, rhonchi or rales.  Abdominal:     General: Bowel sounds are normal. There is no distension.     Palpations: Abdomen is soft.     Tenderness: There is no abdominal tenderness. There  is no guarding or rebound.  Skin:    General: Skin is warm and dry.  Neurological:     General: No focal deficit present.     Mental Status: She is alert and oriented to person, place,  and time.  Psychiatric:        Mood and Affect: Mood normal.        Behavior: Behavior normal.     Data Reviewed:  There are no new results to review at this time.  Assessment and Plan: No notes have been filed under this hospital service. Service: Hospitalist   DKA associated with poorly controlled type 1 diabetes History of DKA Patient with history of poorly controlled insulin-dependent diabetes and history of DKA.  She reports taking Toujeo 12 units nightly and NovoLog 4 units 3 times daily with meals with home sugar readings staying in the upper 200s.  She is apprehensive about taking higher doses of insulin for better glycemic control as she has experienced several hypoglycemic events in the past.  Last A1c was >15.5% (in September 2024).  In the ED, noted to have significant hyperglycemia, elevated anion gap, elevated beta hydroxybutyric acid.  She does not have an acidosis on BMP which may suggest that her DKA was detected early.  She has been placed on Endotool per ED provider and has been provided potassium supplementation and IV fluid rehydration. -Continue Endo tool -IV fluids per DKA protocol -Potassium supplementation as needed while receiving IV insulin -Trend BMP every 4 hours and beta hydroxybutyric acid every 8 hours -Transition to subcutaneous insulin once anion gap is closed x 2 -NPO -Telemetry -Follow-up hemoglobin A1c -Lovenox for DVT prophylaxis  Midsternal chest pain secondary to nondisplaced sternal fracture Noted on x-rays (Care Everywhere) on 12/19.  Unclear etiology, patient thinks it may have been from a fall but is unsure.  This is the source of her chest pain.  Troponins are flat x 2, EKG is without active ischemic changes, and CT PE study without any acute PE.  CT  imaging does show healing sternal fracture.  Will provide pain control with lidocaine patch at this time. -Lidocaine patch  CKD stage II -Kidney function at baseline -Monitor  Hypothyroidism -Continue home Synthroid  Hyperlipidemia -Continue home statin  Duodenitis Patient had an EGD/EUS about 2 months ago showing severe LA grade D erosive esophagitis, gastritis, duodenal ulcer.  She has been on PPI twice daily and Carafate since that time.  It does not appear that she has yet had outpatient GI evaluation, she will need this after discharge. -Continue home PPI twice daily and Carafate -Will need outpatient GI evaluation  Depression and anxiety -Continue home Lexapro, trazodone, Seroquel  Urinary retention -Continue home Flomax    Advance Care Planning:   Code Status: Full Code   Consults: none  Family Communication: no family at bedside  Severity of Illness: The appropriate patient status for this patient is OBSERVATION. Observation status is judged to be reasonable and necessary in order to provide the required intensity of service to ensure the patient's safety. The patient's presenting symptoms, physical exam findings, and initial radiographic and laboratory data in the context of their medical condition is felt to place them at decreased risk for further clinical deterioration. Furthermore, it is anticipated that the patient will be medically stable for discharge from the hospital within 2 midnights of admission.   Author: Briscoe Burns, MD 12/07/2023 1:31 PM  For on call review [email or web address].

## 2023-12-07 NOTE — ED Provider Notes (Signed)
[facility] Provider Note   CSN: [id number] Arrival date & time: 12/07/23  9629     History  Chief Complaint  Patient presents with   Chest Pain    Hx. CHF    [name] [name] is a 62 y.o. female Pt complains of Central chest pain with nausea, vomiting, dry cough.  History of sternal fracture 2 weeks ago, she cannot say what it was from, but reports that 1 had sternal rub to her.  She is taking something at home for pain but she cannot tell me what.  She reports that the pain is not suddenly worse but has been about the whole time.     Chest Pain      Home Medications Prior to Admission medications   Medication Sig Start Date End Date Taking? Authorizing Provider  aspirin EC 81 MG tablet Take 81 mg by mouth daily. Swallow whole.    [provider]  azelastine (ASTELIN) 0.1 % nasal spray Place 1 spray into both nostrils 2 (two) times daily. 11/04/23   [provider]  Continuous Blood Gluc Transmit (DEXCOM G6 TRANSMITTER) MISC USE AS DIRECTED FOR CONTINUOUS GLUCOSE MONITORING. REUSE TRANSMITTER X 90DAYS THEN DISCARD & REPLACE 07/05/21   Bedsole, Amy E, MD  escitalopram (LEXAPRO) 10 MG tablet Take 1 tablet (10 mg total) by mouth daily. 11/18/23   Rodolph Bong, MD  Glucagon, rDNA, (GLUCAGON EMERGENCY) 1 MG KIT Inject 1 mg into the skin See admin instructions. Inject one mg into the skin see administration instructions. Follow package directions for low blood sugar. 06/18/23   [provider]  levothyroxine (SYNTHROID) 137 MCG tablet Take 137 mcg by mouth daily before breakfast. 03/19/22   [provider]  metoCLOPramide (REGLAN) 5 MG tablet Take 1 tablet (5 mg total) by mouth 3 (three) times daily before meals. 11/12/23   Danford, Earl Lites, MD  NOVOLOG FLEXPEN 100 UNIT/ML FlexPen Inject 4 Units into the skin 3 (three) times daily with meals. Plus sliding scale TID    [provider]  omega-3  acid ethyl esters (LOVAZA) 1 g capsule Take 2 capsules (2 g total) by mouth 2 (two) times daily. 09/13/23   Alwyn Ren, MD  pantoprazole (PROTONIX) 40 MG tablet Take 1 tablet (40 mg total) by mouth 2 (two) times daily. 11/18/23   Rodolph Bong, MD  QUEtiapine (SEROQUEL) 50 MG tablet Take 50 mg by mouth at bedtime. 09/09/23   [provider]  rosuvastatin (CRESTOR) 10 MG tablet Take 1 tablet (10 mg total) by mouth daily. 11/18/23   Rodolph Bong, MD  sucralfate (CARAFATE) 1 g tablet Take 1 tablet (1 g total) by mouth 2 (two) times daily. Do not take any other medication within 2-4 hours. 11/18/23   Rodolph Bong, MD  tamsulosin (FLOMAX) 0.4 MG CAPS capsule Take 1 capsule (0.4 mg total) by mouth daily after supper. 09/04/23   Arnetha Courser, MD  TOUJEO SOLOSTAR 300 UNIT/ML Solostar Pen Inject 25 Units into the skin at bedtime. Patient taking differently: Inject 12 Units into the skin at bedtime. 10/28/23   Almon Hercules, MD  traZODone (DESYREL) 50 MG tablet Take 50 mg by mouth at bedtime.    [provider]      Allergies    Codeine    Review of Systems   Review of Systems  Cardiovascular:  Positive for chest pain.  All other systems reviewed and are negative.  Physical Exam Updated Vital Signs BP 125/77   Pulse 98   Temp 98.3 F (36.8 C) (Oral)   Resp 13   LMP 08/11/2012   SpO2 100%  Physical Exam Vitals and nursing note reviewed.  Constitutional:      General: She is not in acute distress.    Appearance: Normal appearance.  HENT:     Head: Normocephalic and atraumatic.  Eyes:     General:        Right eye: No discharge.        Left eye: No discharge.  Cardiovascular:     Rate and Rhythm: Regular rhythm. Tachycardia present.     Heart sounds: No murmur heard.    No friction rub. No gallop.  Pulmonary:     Effort: Pulmonary effort is normal.     Breath sounds: Normal breath sounds.  Chest:     Comments: Very focally tender in the  central sternum, equal breath sounds bilaterally, no wheezing, rhonchi, stridor, rales. Abdominal:     General: Bowel sounds are normal.     Palpations: Abdomen is soft.  Skin:    General: Skin is warm and dry.     Capillary Refill: Capillary refill takes less than 2 seconds.  Neurological:     Mental Status: She is alert and oriented to person, place, and time.  Psychiatric:        Mood and Affect: Mood normal.        Behavior: Behavior normal.     ED Results / Procedures / Treatments   Labs (all labs ordered are listed, but only abnormal results are displayed) Labs Reviewed  BASIC METABOLIC PANEL - Abnormal; Notable for the following components:      Result Value   Potassium 3.3 (*)    Chloride 92 (*)    Glucose, Bld 471 (*)    Anion gap 19 (*)    All other components within normal limits  CBC - Abnormal; Notable for the following components:   WBC 14.4 (*)    RBC 5.69 (*)    Hemoglobin 15.9 (*)    HCT 48.6 (*)    Platelets 450 (*)    All other components within normal limits  BETA-HYDROXYBUTYRIC ACID - Abnormal; Notable for the following components:   Beta-Hydroxybutyric Acid 1.79 (*)    All other components within normal limits  BASIC METABOLIC PANEL - Abnormal; Notable for the following components:   Sodium 132 (*)    Chloride 88 (*)    Glucose, Bld 432 (*)    Anion gap 18 (*)    All other components within normal limits  CBG MONITORING, ED - Abnormal; Notable for the following components:   Glucose-Capillary 392 (*)    All other components within normal limits  CBG MONITORING, ED - Abnormal; Notable for the following components:   Glucose-Capillary 267 (*)    All other components within normal limits  I-STAT VENOUS BLOOD GAS, ED - Abnormal; Notable for the following components:   pH, Ven 7.517 (*)    pCO2, Ven 41.2 (*)    pO2, Ven 62 (*)    Bicarbonate 33.4 (*)    TCO2 35 (*)    Acid-Base Excess 9.0 (*)    Sodium 134 (*)    Calcium, Ion 1.06 (*)    All  other components within normal limits  CBG MONITORING, ED - Abnormal; Notable for the following components:   Glucose-Capillary 206 (*)    All other components within normal  limits  LIPASE, BLOOD  BASIC METABOLIC PANEL  BASIC METABOLIC PANEL  BASIC METABOLIC PANEL  URINALYSIS, ROUTINE W REFLEX MICROSCOPIC  HEMOGLOBIN A1C  BETA-HYDROXYBUTYRIC ACID  TROPONIN I (HIGH SENSITIVITY)  TROPONIN I (HIGH SENSITIVITY)    EKG None  Radiology CT Angio Chest PE W and/or Wo Contrast Result Date: 12/07/2023 CLINICAL DATA:  62 year old female with history of central chest pain. Emesis. Mild shortness of breath and dry cough. EXAM: CT ANGIOGRAPHY CHEST WITH CONTRAST TECHNIQUE: Multidetector CT imaging of the chest was performed using the standard protocol during bolus administration of intravenous contrast. Multiplanar CT image reconstructions and MIPs were obtained to evaluate the vascular anatomy. RADIATION DOSE REDUCTION: This exam was performed according to the departmental dose-optimization program which includes automated exposure control, adjustment of the mA and/or kV according to patient size and/or use of iterative reconstruction technique. CONTRAST:  75mL OMNIPAQUE IOHEXOL 350 MG/ML SOLN COMPARISON:  No priors. FINDINGS: Cardiovascular: No filling defects are noted within the pulmonary arterial tree to suggest pulmonary embolism. Heart size is normal. There is no significant pericardial fluid, thickening or pericardial calcification. There is aortic atherosclerosis, as well as atherosclerosis of the great vessels of the mediastinum and the coronary arteries, including calcified atherosclerotic plaque in the left anterior descending and left circumflex coronary arteries. Mediastinum/Nodes: No pathologically enlarged mediastinal or hilar lymph nodes. Esophagus is unremarkable in appearance. No axillary lymphadenopathy. Lungs/Pleura: No acute consolidative airspace disease. No pleural effusions. No  suspicious appearing pulmonary nodules or masses are noted. Upper Abdomen: Unremarkable. Musculoskeletal: Irregularity of the upper sternum, likely a healing nondisplaced fracture. There are no aggressive appearing lytic or blastic lesions noted in the visualized portions of the skeleton. Review of the MIP images confirms the above findings. IMPRESSION: 1. No evidence of pulmonary embolism. 2. No acute findings are noted in the thorax to account for the patient's symptoms. 3. Probable healing nondisplaced fracture of the upper sternum. Electronically Signed   By: Trudie Reed M.D.   On: 12/07/2023 12:26   DG Chest 2 View Result Date: 12/07/2023 CLINICAL DATA:  62 year old female history of chest pain. EXAM: CHEST - 2 VIEW COMPARISON:  Chest x-ray 05/14/2023. FINDINGS: Lung volumes are normal. No consolidative airspace disease. No pleural effusions. No pneumothorax. No pulmonary nodule or mass noted. Pulmonary vasculature and the cardiomediastinal silhouette are within normal limits. Atherosclerosis in the thoracic aorta. IMPRESSION: 1.  No radiographic evidence of acute cardiopulmonary disease. 2. Aortic atherosclerosis. Electronically Signed   By: Trudie Reed M.D.   On: 12/07/2023 07:58    Procedures Procedures    Medications Ordered in ED Medications  lactated ringers infusion (0 mLs Intravenous Stopped 12/07/23 1309)  dextrose 5 % in lactated ringers infusion ( Intravenous New Bag/Given 12/07/23 1308)  dextrose 50 % solution 0-50 mL (has no administration in time range)  potassium chloride 10 mEq in 100 mL IVPB (10 mEq Intravenous New Bag/Given 12/07/23 1232)  insulin regular, human (MYXREDLIN) 100 units/ 100 mL infusion (2.2 Units/hr Intravenous Rate/Dose Change 12/07/23 1300)  enoxaparin (LOVENOX) injection 40 mg (has no administration in time range)  ondansetron (ZOFRAN-ODT) disintegrating tablet 4 mg (4 mg Oral Given 12/07/23 0657)  oxyCODONE-acetaminophen (PERCOCET/ROXICET) 5-325  MG per tablet 1 tablet (1 tablet Oral Given 12/07/23 0657)  morphine (PF) 4 MG/ML injection 4 mg (4 mg Intravenous Given 12/07/23 1026)  lactated ringers bolus 1,000 mL (0 mLs Intravenous Stopped 12/07/23 1229)  ondansetron (ZOFRAN) injection 4 mg (4 mg Intravenous Given 12/07/23 1025)  iohexol (OMNIPAQUE) 350 MG/ML  injection 75 mL (75 mLs Intravenous Contrast Given 12/07/23 1126)  HYDROmorphone (DILAUDID) injection 1 mg (1 mg Intravenous Given 12/07/23 1305)    ED Course/ Medical Decision Making/ A&P                                 Medical Decision Making Amount and/or Complexity of Data Reviewed Labs: ordered. Radiology: ordered.  Risk Prescription drug management. Decision regarding hospitalization.   This patient is a 62 y.o. female  who presents to the ED for concern of chest pain, shob, coughing.   Differential diagnoses prior to evaluation: The emergent differential diagnosis includes, but is not limited to,  ACS, AAS, PE, Mallory-Weiss, Boerhaave's, Pneumonia, acute bronchitis, asthma or COPD exacerbation, anxiety, MSK pain or traumatic injury to the chest, acid reflux versus other, suspect complication or ongoing pain from sternum fracture . This is not an exhaustive differential.   Past Medical History / Co-morbidities / Social History: Hypertension, hyperlipidemia, hypothyroidism, paroxysmal A-fib, depression, diastolic CHF, type 1 diabetes, DKA  Additional history: Chart reviewed. Pertinent results include: Reviewed lab work, imaging from patient's recent previous emergency department evaluation, recent discharge after being admitted on the fifth discharged on the ninth secondary to pancreatitis, diabetes, AKI  Physical Exam: Physical exam performed. The pertinent findings include: Quite tachycardic with increased respiratory effort on initial arrival, initial pulse 123, tachycardia improved after fluids and sugar control, oxygen saturation has been 100% on room  air.  Lab Tests/Imaging studies: I personally interpreted labs/imaging and the pertinent results include: Beta hydroxy butyrate acid elevated 1.79, patient with an anion gap, 18, she is pseudohyponatremia, sodium 132 in context of glucose of 432.  CBC notable for leukocytosis, blood cells 14.4, she does appear hemoconcentrated with elevated hemoglobin and platelets as well however.  Troponin is normal. Fracture of the sternum noted, otherwise unremarkable CT PE study.  I agree with the radiologist interpretation.  Cardiac monitoring: EKG obtained and interpreted by myself and attending physician which shows: Sinus tachycardia.   Medications: I ordered medication including fluids, insulin, pain medicine administered for her significant sternal chest pain, as well as her DKA.  I have reviewed the patients home medicines and have made adjustments as needed.   Consults: I spoke with the hospitalist, Dr. Joellyn Quails, after discussion of her physical exam findings, lab work, agrees to admission for DKA. Final Clinical Impression(s) / ED Diagnoses Final diagnoses:  Diabetic ketoacidosis without coma associated with type 1 diabetes mellitus [facility])    Rx / DC Orders ED Discharge Orders     None         [place] 12/07/23 1339    Ernie Avena, MD 12/07/23 1407

## 2023-12-07 NOTE — ED Notes (Signed)
Checked patient cbg it was 24 notified RN Hannie of blood sugar patient is resting  with call bell in reach

## 2023-12-07 NOTE — ED Notes (Signed)
Provider aware of BG and recommends to continue endotool, despite dropping BG.

## 2023-12-07 NOTE — ED Notes (Signed)
Hospitalist at bedside 

## 2023-12-07 NOTE — ED Notes (Signed)
Per provider, SQ insulin given after 2nd BMP resulted with gap resolution, then discontinue insulin drip and D5LR 2 hours after SQ injection.

## 2023-12-07 NOTE — ED Notes (Signed)
Checked patient cbg it was 130 notified RN of blood sugar patient is resting with call bell in reach

## 2023-12-07 NOTE — ED Provider Triage Note (Signed)
Emergency Medicine Provider Triage Evaluation Note  [name] [name] , a 62 y.o. female  was evaluated in triage.  Pt complains of Central chest pain with nausea, vomiting, dry cough.  History of sternal fracture 2 weeks ago, she cannot say what it was from, but reports that 1 had sternal rub to her.  She is taking something at home for pain but she cannot tell me what.  She reports that the pain is not suddenly worse but has been about the whole time.  Review of Systems  Positive: Chest pain, nausea, vomiting, shortness of breath Negative: Fever, chills  Physical Exam  BP (!) 141/103 (BP Location: Right Arm)   Pulse (!) 123   Temp (!) 97.4 F (36.3 C)   Resp 19   LMP 08/11/2012   SpO2 99%  Gen:   Awake, some distress Resp:  Normal effort  MSK:   Moves extremities without difficulty  Other:  Very focally tender in the central sternum, equal breath sounds bilaterally, no wheezing, rhonchi, stridor, rales.  Medical Decision Making  Medically screening exam initiated at 7:01 AM.  Appropriate orders placed.  [name] [name] was informed that the remainder of the evaluation will be completed by another provider, this initial triage assessment does not replace that evaluation, and the importance of remaining in the ED until their evaluation is complete.  Workup initiated in triage    Olene Floss, [place] 12/07/23 9604

## 2023-12-07 NOTE — ED Triage Notes (Signed)
Patient arrived with EMS from home reports central chest pain this morning with emesis , mild SOB and dry cough , CBG=312, history of sternal fracture 2 weeks ago .

## 2023-12-07 NOTE — ED Notes (Signed)
Per Endotool, contact provider for transition orders, this RN notified attending Marlyce Huge MD, No new orders given at this time

## 2023-12-07 NOTE — ED Notes (Signed)
Per MD order to maintain BG, insulin will remain at 0.5 ml/hr.

## 2023-12-07 NOTE — ED Notes (Signed)
Checked patient blood sugar it was 164 patient is resting with call bell in reach

## 2023-12-07 NOTE — ED Notes (Signed)
IV team at bedside 

## 2023-12-07 NOTE — ED Notes (Signed)
Checked patient cbg it was 49 notified RN Morrie Sheldon of blood sugar patient is resting with call bell in reach

## 2023-12-07 NOTE — ED Notes (Signed)
Attending Marlyce Huge MD notified of cbg and Anion GAP 12

## 2023-12-08 DIAGNOSIS — E039 Hypothyroidism, unspecified: Secondary | ICD-10-CM

## 2023-12-08 DIAGNOSIS — N182 Chronic kidney disease, stage 2 (mild): Secondary | ICD-10-CM

## 2023-12-08 DIAGNOSIS — E1065 Type 1 diabetes mellitus with hyperglycemia: Secondary | ICD-10-CM | POA: Diagnosis not present

## 2023-12-08 DIAGNOSIS — E1010 Type 1 diabetes mellitus with ketoacidosis without coma: Secondary | ICD-10-CM | POA: Diagnosis not present

## 2023-12-08 DIAGNOSIS — R339 Retention of urine, unspecified: Secondary | ICD-10-CM

## 2023-12-08 DIAGNOSIS — F331 Major depressive disorder, recurrent, moderate: Secondary | ICD-10-CM

## 2023-12-08 LAB — BASIC METABOLIC PANEL
Anion gap: 10 (ref 5–15)
Anion gap: 10 (ref 5–15)
BUN: 11 mg/dL (ref 6–20)
BUN: 12 mg/dL (ref 6–20)
CO2: 25 mmol/L (ref 22–32)
CO2: 28 mmol/L (ref 22–32)
Calcium: 8.7 mg/dL — ABNORMAL LOW (ref 8.9–10.3)
Calcium: 8.8 mg/dL — ABNORMAL LOW (ref 8.9–10.3)
Chloride: 98 mmol/L (ref 98–111)
Chloride: 96 mmol/L — ABNORMAL LOW (ref 98–111)
Creatinine, Ser: 0.69 mg/dL (ref 0.44–1.00)
Creatinine, Ser: 0.69 mg/dL (ref 0.44–1.00)
GFR, Estimated: >60 mL/min (ref >60)
GFR, Estimated: >60 mL/min (ref >60)
Glucose, Bld: 245 mg/dL — ABNORMAL HIGH (ref 70–99)
Glucose, Bld: 233 mg/dL — ABNORMAL HIGH (ref 70–99)
Potassium: 3.7 mmol/L (ref 3.5–5.1)
Potassium: 3.7 mmol/L (ref 3.5–5.1)
Sodium: 133 mmol/L — ABNORMAL LOW (ref 135–145)
Sodium: 134 mmol/L — ABNORMAL LOW (ref 135–145)

## 2023-12-08 LAB — CBG MONITORING, ED: Glucose-Capillary: 212 mg/dL — ABNORMAL HIGH (ref 70–99)

## 2023-12-08 LAB — BETA-HYDROXYBUTYRIC ACID: Beta-Hydroxybutyric Acid: 1.36 mmol/L — ABNORMAL HIGH (ref 0.05–0.27)

## 2023-12-08 MED ORDER — NOVOLOG FLEXPEN 100 UNIT/ML ~~LOC~~ SOPN: 10.0000 [IU] | PEN_INJECTOR | Freq: Three times a day (TID) | SUBCUTANEOUS | 0 refills | Status: DC

## 2023-12-08 MED ORDER — TOUJEO SOLOSTAR 300 UNIT/ML ~~LOC~~ SOPN: 25.0000 [IU] | PEN_INJECTOR | Freq: Every day | SUBCUTANEOUS | 1 refills | Status: DC

## 2023-12-08 NOTE — ED Notes (Signed)
This RN reviewed discharge instructions with patient. She verbalized understanding and denied any further questions. PT well appearing upon discharge and reports tolerable pain. Pt ambulated with stable gait to exit. Pt endorses ride home.  

## 2023-12-08 NOTE — Discharge Summary (Signed)
Physician Discharge Summary  [name] [name] AVW:[id number] DOB: 1961-08-06 DOA: 12/07/2023  PCP: Everardo All, NP  Admit date: 12/07/2023 Discharge date: 12/08/2023  Admitted From: Home Disposition: Home  Recommendations for Outpatient Follow-up:  Follow up with PCP in 1-2 weeks Follow-up with endocrinology as discussed in the next 1 to 2 weeks  Home Health: None Equipment/Devices: None  Discharge Condition: Stable CODE STATUS: Full Diet recommendation: Low-carb diet  Brief/Interim Summary: [name] [name] is a 62 y.o. female with medical history significant of markedly uncontrolled insulin-dependent diabetes mellitis(A1c >15.5) with recurrent episodes of DKA, and pancreatitis(admitted to our facility for the same last week)  She has concurrent history of gastroparesis, hyperlipidemia, hypothyroidism, CKD stage II, duodenitis, depression which appear to be well-controlled at this time.  Patient admitted with profoundly elevated glucose in the setting of DKA, her gap is closed she is tolerating p.o. and has been transitioned to her long-acting insulin.  Home insulin has been increased as below, 10 units Premeal and 25 unit long-acting nightly.  We discussed patient's noncompliance with diet is likely her etiology given she reports drinking sugary beverages including juice.  Follow-up with endocrinology as planned in the next few weeks, discussed with patient she would likely improve with an insulin pump but would defer to their expertise.  She is otherwise stable and agreeable for discharge home  Discharge Diagnoses:  Principal Problem:   Diabetic ketoacidosis associated with type 1 diabetes mellitus ([name]) Active Problems:   Urinary retention   Uncontrolled type 1 diabetes mellitus with hyperglycemia, with long-term current use of insulin ([name])   Hypothyroidism   Kidney disease, chronic, stage II (GFR 60-89 ml/min)   MDD (major depressive disorder), recurrent episode, moderate  ([name])    Discharge Instructions  Discharge Instructions     Diet - low sodium heart healthy   Complete by: As directed    Discharge instructions   Complete by: As directed    Follow up with endocrinologist for titration of diabetic medications/insulin.   Increase activity slowly   Complete by: As directed       Allergies as of 12/08/2023       Reactions   Codeine Hives, Anxiety, Other (See Comments)        Medication List     TAKE these medications    aspirin EC 81 MG tablet Take 81 mg by mouth daily. Swallow whole.   azelastine 0.1 % nasal spray Commonly known as: ASTELIN Place 1 spray into both nostrils as needed for rhinitis or allergies.   Dexcom G6 Transmitter Misc USE AS DIRECTED FOR CONTINUOUS GLUCOSE MONITORING. REUSE TRANSMITTER X 90DAYS THEN DISCARD & REPLACE   escitalopram 10 MG tablet Commonly known as: LEXAPRO Take 1 tablet (10 mg total) by mouth daily.   Glucagon Emergency 1 MG Kit Inject 1 mg into the skin See admin instructions. Inject one mg into the skin see administration instructions. Follow package directions for low blood sugar.   levothyroxine 137 MCG tablet Commonly known as: SYNTHROID Take 137 mcg by mouth daily before breakfast.   metoCLOPramide 5 MG tablet Commonly known as: Reglan Take 1 tablet (5 mg total) by mouth 3 (three) times daily before meals.   NovoLOG FlexPen 100 UNIT/ML FlexPen Generic drug: insulin aspart Inject 10 Units into the skin 3 (three) times daily with meals. Plus sliding scale TID What changed: how much to take   omega-3 acid ethyl esters 1 g capsule Commonly known as: LOVAZA Take 2 capsules (2 g total)  by mouth 2 (two) times daily.   pantoprazole 40 MG tablet Commonly known as: Protonix Take 1 tablet (40 mg total) by mouth 2 (two) times daily.   QUEtiapine 50 MG tablet Commonly known as: SEROQUEL Take 50 mg by mouth at bedtime.   rosuvastatin 10 MG tablet Commonly known as: CRESTOR Take 1  tablet (10 mg total) by mouth daily.   sucralfate 1 g tablet Commonly known as: CARAFATE Take 1 tablet (1 g total) by mouth 2 (two) times daily. Do not take any other medication within 2-4 hours.   tamsulosin 0.4 MG Caps capsule Commonly known as: FLOMAX Take 1 capsule (0.4 mg total) by mouth daily after supper.   Toujeo SoloStar 300 UNIT/ML Solostar Pen Generic drug: insulin glargine (1 Unit Dial) Inject 25 Units into the skin at bedtime. What changed: how much to take   traZODone 50 MG tablet Commonly known as: DESYREL Take 50 mg by mouth at bedtime.        Allergies  Allergen Reactions   Codeine Hives, Anxiety and Other (See Comments)    Consultations: None  Procedures/Studies: CT Angio Chest PE W and/or Wo Contrast Result Date: 12/07/2023 CLINICAL DATA:  62 year old female with history of central chest pain. Emesis. Mild shortness of breath and dry cough. EXAM: CT ANGIOGRAPHY CHEST WITH CONTRAST TECHNIQUE: Multidetector CT imaging of the chest was performed using the standard protocol during bolus administration of intravenous contrast. Multiplanar CT image reconstructions and MIPs were obtained to evaluate the vascular anatomy. RADIATION DOSE REDUCTION: This exam was performed according to the departmental dose-optimization program which includes automated exposure control, adjustment of the mA and/or kV according to patient size and/or use of iterative reconstruction technique. CONTRAST:  75mL OMNIPAQUE IOHEXOL 350 MG/ML SOLN COMPARISON:  No priors. FINDINGS: Cardiovascular: No filling defects are noted within the pulmonary arterial tree to suggest pulmonary embolism. Heart size is normal. There is no significant pericardial fluid, thickening or pericardial calcification. There is aortic atherosclerosis, as well as atherosclerosis of the great vessels of the mediastinum and the coronary arteries, including calcified atherosclerotic plaque in the left anterior descending and  left circumflex coronary arteries. Mediastinum/Nodes: No pathologically enlarged mediastinal or hilar lymph nodes. Esophagus is unremarkable in appearance. No axillary lymphadenopathy. Lungs/Pleura: No acute consolidative airspace disease. No pleural effusions. No suspicious appearing pulmonary nodules or masses are noted. Upper Abdomen: Unremarkable. Musculoskeletal: Irregularity of the upper sternum, likely a healing nondisplaced fracture. There are no aggressive appearing lytic or blastic lesions noted in the visualized portions of the skeleton. Review of the MIP images confirms the above findings. IMPRESSION: 1. No evidence of pulmonary embolism. 2. No acute findings are noted in the thorax to account for the patient's symptoms. 3. Probable healing nondisplaced fracture of the upper sternum. Electronically Signed   By: Trudie Reed M.D.   On: 12/07/2023 12:26   DG Chest 2 View Result Date: 12/07/2023 CLINICAL DATA:  62 year old female history of chest pain. EXAM: CHEST - 2 VIEW COMPARISON:  Chest x-ray 05/14/2023. FINDINGS: Lung volumes are normal. No consolidative airspace disease. No pleural effusions. No pneumothorax. No pulmonary nodule or mass noted. Pulmonary vasculature and the cardiomediastinal silhouette are within normal limits. Atherosclerosis in the thoracic aorta. IMPRESSION: 1.  No radiographic evidence of acute cardiopulmonary disease. 2. Aortic atherosclerosis. Electronically Signed   By: Trudie Reed M.D.   On: 12/07/2023 07:58   CT ABDOMEN PELVIS W CONTRAST Result Date: 11/11/2023 CLINICAL DATA:  Acute nonlocalized upper abdominal pain. History of gastric  ulcers. Nausea, vomiting, and diarrhea. EXAM: CT ABDOMEN AND PELVIS WITH CONTRAST TECHNIQUE: Multidetector CT imaging of the abdomen and pelvis was performed using the standard protocol following bolus administration of intravenous contrast. RADIATION DOSE REDUCTION: This exam was performed according to the departmental  dose-optimization program which includes automated exposure control, adjustment of the mA and/or kV according to patient size and/or use of iterative reconstruction technique. CONTRAST:  80mL OMNIPAQUE IOHEXOL 300 MG/ML  SOLN COMPARISON:  CT 10/25/2023.  MRI 09/24/2023 FINDINGS: Lower chest: Lung bases are clear. Hepatobiliary: Mild diffuse fatty infiltration of the liver. Gallbladder and bile ducts are normal. Pancreas: Unremarkable. No pancreatic ductal dilatation or surrounding inflammatory changes. Spleen: Normal in size without focal abnormality. Adrenals/Urinary Tract: Adrenal glands are unremarkable. Kidneys are normal, without renal calculi, focal lesion, or hydronephrosis. Bladder is unremarkable. Stomach/Bowel: The stomach is fluid distended. This may be physiologic or could indicate dysmotility. There is evidence of wall thickening and edema around the duodenal bulb and pyloric region of the stomach. This is compatible with history of peptic ulcer disease. No evidence of perforation. Small bowel and colon are otherwise unremarkable. Appendix is normal. Vascular/Lymphatic: Aortic atherosclerosis. No enlarged abdominal or pelvic lymph nodes. Reproductive: Status post hysterectomy. No adnexal masses. Other: No abdominal wall hernia or abnormality. No abdominopelvic ascites. Musculoskeletal: No acute or significant osseous findings. IMPRESSION: 1. Wall thickening and edema in the pylorus and duodenal bulb region consistent with history of peptic ulcer disease. No evidence of perforation. 2. Fatty infiltration of the liver. 3. Aortic atherosclerosis. Electronically Signed   By: Burman Nieves M.D.   On: 11/11/2023 21:10     Subjective: No acute issues or events overnight   Discharge Exam: Vitals:   12/08/23 1102 12/08/23 1106  BP:  92/67  Pulse:  85  Resp:  12  Temp: 98.2 F (36.8 C) 98.6 F (37 C)  SpO2:  98%   Vitals:   12/08/23 0831 12/08/23 1100 12/08/23 1102 12/08/23 1106  BP:   116/79  92/67  Pulse:  84  85  Resp:  (!) 9  12  Temp: 98.2 F (36.8 C)  98.2 F (36.8 C) 98.6 F (37 C)  TempSrc:    Oral  SpO2:  99%  98%    General: Pt is alert, awake, not in acute distress Cardiovascular: RRR, S1/S2 +, no rubs, no gallops Respiratory: CTA bilaterally, no wheezing, no rhonchi Abdominal: Soft, NT, ND, bowel sounds + Extremities: no edema, no cyanosis    The results of significant diagnostics from this hospitalization (including imaging, microbiology, ancillary and laboratory) are listed below for reference.     Microbiology: No results found for this or any previous visit (from the past 240 hours).   Labs: BNP (last 3 results) No results for input(s): "BNP" in the last 8760 hours. Basic Metabolic Panel: Recent Labs  Lab 12/07/23 1408 12/07/23 1743 12/07/23 2132 12/08/23 0227 12/08/23 0614  NA 135 135 133* 133* 134*  K 3.5 3.8 3.7 3.7 3.7  CL 93* 94* 98 98 96*  CO2 30 28 28 25 28   GLUCOSE 244* 205* 147* 245* 233*  BUN 14 12 13 11 12   CREATININE 0.78 0.64 0.63 0.69 0.69  CALCIUM 9.0 9.0 8.7* 8.7* 8.8*   Liver Function Tests: No results for input(s): "AST", "ALT", "ALKPHOS", "BILITOT", "PROT", "ALBUMIN" in the last 168 hours. Recent Labs  Lab 12/07/23 1015  LIPASE 28   No results for input(s): "AMMONIA" in the last 168 hours. CBC: Recent Labs  Lab 12/07/23 0700 12/07/23 1214  WBC 14.4*  --   HGB 15.9* 14.3  HCT 48.6* 42.0  MCV 85.4  --   PLT 450*  --    Cardiac Enzymes: No results for input(s): "CKTOTAL", "CKMB", "CKMBINDEX", "TROPONINI" in the last 168 hours. BNP: Invalid input(s): "POCBNP" CBG: Recent Labs  Lab 12/07/23 1800 12/07/23 1851 12/07/23 2026 12/07/23 2213 12/08/23 0825  GLUCAP 144* 160* 178* 139* 212*   D-Dimer No results for input(s): "DDIMER" in the last 72 hours. Hgb A1c No results for input(s): "HGBA1C" in the last 72 hours. Lipid Profile No results for input(s): "CHOL", "HDL", "LDLCALC", "TRIG",  "CHOLHDL", "LDLDIRECT" in the last 72 hours. Thyroid function studies No results for input(s): "TSH", "T4TOTAL", "T3FREE", "THYROIDAB" in the last 72 hours.  Invalid input(s): "FREET3" Anemia work up No results for input(s): "VITAMINB12", "FOLATE", "FERRITIN", "TIBC", "IRON", "RETICCTPCT" in the last 72 hours. Urinalysis    Component Value Date/Time   COLORURINE STRAW (A) 11/14/2023 1209   APPEARANCEUR CLEAR 11/14/2023 1209   LABSPEC 1.007 11/14/2023 1209   PHURINE 7.0 11/14/2023 1209   GLUCOSEU >=500 (A) 11/14/2023 1209   HGBUR NEGATIVE 11/14/2023 1209   BILIRUBINUR NEGATIVE 11/14/2023 1209   BILIRUBINUR negative 03/09/2021 1123   KETONESUR 5 (A) 11/14/2023 1209   PROTEINUR NEGATIVE 11/14/2023 1209   UROBILINOGEN 0.2 03/09/2021 1123   UROBILINOGEN 0.2 10/28/2013 1723   NITRITE NEGATIVE 11/14/2023 1209   LEUKOCYTESUR NEGATIVE 11/14/2023 1209   Sepsis Labs Recent Labs  Lab 12/07/23 0700  WBC 14.4*   Microbiology No results found for this or any previous visit (from the past 240 hours).   Time coordinating discharge: Over 30 minutes  SIGNED:   Azucena Fallen, DO [facility] 12/08/2023, 1:21 PM Pager   If 7PM-7AM, please contact night-coverage [email or web address]

## 2023-12-09 LAB — HEMOGLOBIN A1C
Hgb A1c MFr Bld: 12.1 % — ABNORMAL HIGH (ref 4.8–5.6)
Mean Plasma Glucose: 301 mg/dL

## 2023-12-10 ENCOUNTER — Emergency Department (HOSPITAL_COMMUNITY): Payer: BC Managed Care – PPO

## 2023-12-10 ENCOUNTER — Emergency Department (HOSPITAL_COMMUNITY)
Admission: EM | Admit: 2023-12-10 | Discharge: 2023-12-10 | Discharge status: Against Medical Advice | Payer: BC Managed Care – PPO | Attending: Emergency Medicine | Admitting: Emergency Medicine

## 2023-12-10 DIAGNOSIS — R0789 Other chest pain: Secondary | ICD-10-CM | POA: Diagnosis not present

## 2023-12-10 DIAGNOSIS — R110 Nausea: Secondary | ICD-10-CM | POA: Insufficient documentation

## 2023-12-10 DIAGNOSIS — R079 Chest pain, unspecified: Secondary | ICD-10-CM | POA: Diagnosis present

## 2023-12-10 DIAGNOSIS — Z5321 Procedure and treatment not carried out due to patient leaving prior to being seen by health care provider: Secondary | ICD-10-CM | POA: Insufficient documentation

## 2023-12-10 LAB — CBC
HCT: 45.5 % (ref 36.0–46.0)
Hemoglobin: 15.1 g/dL — ABNORMAL HIGH (ref 12.0–15.0)
MCH: 28.4 pg (ref 26.0–34.0)
MCHC: 33.2 g/dL (ref 30.0–36.0)
MCV: 85.5 fL (ref 80.0–100.0)
Platelets: 386 10*3/uL (ref 150–400)
RBC: 5.32 MIL/uL — ABNORMAL HIGH (ref 3.87–5.11)
RDW: 14.4 % (ref 11.5–15.5)
WBC: 9.2 10*3/uL (ref 4.0–10.5)
nRBC: 0 % (ref 0.0–0.2)

## 2023-12-10 LAB — COMPREHENSIVE METABOLIC PANEL
ALT: 14 U/L (ref 0–44)
AST: 19 U/L (ref 15–41)
Albumin: 4 g/dL (ref 3.5–5.0)
Alkaline Phosphatase: 61 U/L (ref 38–126)
Anion gap: 11 (ref 5–15)
BUN: 15 mg/dL (ref 6–20)
CO2: 26 mmol/L (ref 22–32)
Calcium: 9.2 mg/dL (ref 8.9–10.3)
Chloride: 99 mmol/L (ref 98–111)
Creatinine, Ser: 0.79 mg/dL (ref 0.44–1.00)
GFR, Estimated: >60 mL/min (ref >60)
Glucose, Bld: 240 mg/dL — ABNORMAL HIGH (ref 70–99)
Potassium: 3.7 mmol/L (ref 3.5–5.1)
Sodium: 136 mmol/L (ref 135–145)
Total Bilirubin: 0.8 mg/dL (ref 0.0–1.2)
Total Protein: 7.3 g/dL (ref 6.5–8.1)

## 2023-12-10 LAB — LIPASE, BLOOD: Lipase: 32 U/L (ref 11–51)

## 2023-12-10 LAB — TROPONIN I (HIGH SENSITIVITY): Troponin I (High Sensitivity): 3 ng/L (ref <18)

## 2023-12-10 LAB — CBG MONITORING, ED: Glucose-Capillary: 232 mg/dL — ABNORMAL HIGH (ref 70–99)

## 2023-12-10 MED ORDER — ONDANSETRON HCL 4 MG/2ML IJ SOLN
4.0000 mg | Freq: Once | INTRAMUSCULAR | Status: AC
  Administered 2023-12-10: 4 mg via INTRAVENOUS
  Filled 2023-12-10: qty 2

## 2023-12-10 MED ORDER — KETOROLAC TROMETHAMINE 30 MG/ML IJ SOLN
15.0000 mg | Freq: Once | INTRAMUSCULAR | Status: AC
  Administered 2023-12-10: 15 mg via INTRAVENOUS
  Filled 2023-12-10: qty 1

## 2023-12-10 NOTE — ED Triage Notes (Signed)
Pt came from home, had a recent fall causing a sternum fracture, taking prescribed medication for pain but dosent rember what drug name was.   BS was 236 by ems.   History of diabetes and dka.

## 2023-12-10 NOTE — ED Provider Triage Note (Signed)
 Emergency Medicine Provider Triage Evaluation Note  [name] [name] , a 62 y.o. female  was evaluated in triage.  Pt complains of chest pain since 12/17. She was on  her back porch when her foot slipped in the rain and fell down ramp of porch landing on chest. She was seen by PCP who provided her tramadol  for pain. She reports no improvement with last dose last night. Complaining of midsternal chest pain and nausea.  XR from 12/17 shows sternum fx  Review of Systems  Positive: Chest wall pain Negative: No fever  Physical Exam  BP (!) 146/112   Pulse (!) 106   Temp 97.8 F (36.6 C) (Oral)   Resp (!) 21   Ht 4' 11 (1.499 m)   Wt 55.3 kg   LMP 08/11/2012   SpO2 99%   BMI 24.64 kg/m  Gen:   Awake, no distress   Resp:  Normal effort  MSK:   Moves extremities without difficulty  Other:    Medical Decision Making  Medically screening exam initiated at 7:28 AM.  Appropriate orders placed.  [name] [name] was informed that the remainder of the evaluation will be completed by another provider, this initial triage assessment does not replace that evaluation, and the importance of remaining in the ED until their evaluation is complete.  Labs, EKG, CXR   [name] [name], [name] [name] 12/10/23 [id number]

## 2023-12-10 NOTE — ED Notes (Signed)
Pt wanted IV removed stated she couldn't wait any longer

## 2023-12-12 ENCOUNTER — Emergency Department (HOSPITAL_COMMUNITY)
Admission: EM | Admit: 2023-12-12 | Discharge: 2023-12-12 | Disposition: A | Discharge status: Home / Self Care | Payer: BC Managed Care – PPO | Attending: Emergency Medicine | Admitting: Emergency Medicine

## 2023-12-12 ENCOUNTER — Emergency Department (HOSPITAL_COMMUNITY): Payer: BC Managed Care – PPO

## 2023-12-12 DIAGNOSIS — E119 Type 2 diabetes mellitus without complications: Secondary | ICD-10-CM | POA: Insufficient documentation

## 2023-12-12 DIAGNOSIS — Z794 Long term (current) use of insulin: Secondary | ICD-10-CM | POA: Diagnosis not present

## 2023-12-12 DIAGNOSIS — R079 Chest pain, unspecified: Secondary | ICD-10-CM | POA: Insufficient documentation

## 2023-12-12 DIAGNOSIS — W01198A Fall on same level from slipping, tripping and stumbling with subsequent striking against other object, initial encounter: Secondary | ICD-10-CM | POA: Diagnosis not present

## 2023-12-12 DIAGNOSIS — Z7982 Long term (current) use of aspirin: Secondary | ICD-10-CM | POA: Insufficient documentation

## 2023-12-12 DIAGNOSIS — W19XXXA Unspecified fall, initial encounter: Secondary | ICD-10-CM

## 2023-12-12 DIAGNOSIS — E871 Hypo-osmolality and hyponatremia: Secondary | ICD-10-CM | POA: Insufficient documentation

## 2023-12-12 DIAGNOSIS — R42 Dizziness and giddiness: Secondary | ICD-10-CM | POA: Diagnosis present

## 2023-12-12 LAB — BASIC METABOLIC PANEL
Anion gap: 11 (ref 5–15)
BUN: 25 mg/dL — ABNORMAL HIGH (ref 6–20)
CO2: 27 mmol/L (ref 22–32)
Calcium: 9.2 mg/dL (ref 8.9–10.3)
Chloride: 90 mmol/L — ABNORMAL LOW (ref 98–111)
Creatinine, Ser: 0.95 mg/dL (ref 0.44–1.00)
GFR, Estimated: >60 mL/min (ref >60)
Glucose, Bld: 270 mg/dL — ABNORMAL HIGH (ref 70–99)
Potassium: 3.5 mmol/L (ref 3.5–5.1)
Sodium: 128 mmol/L — ABNORMAL LOW (ref 135–145)

## 2023-12-12 LAB — CBC
HCT: 45.4 % (ref 36.0–46.0)
Hemoglobin: 15.6 g/dL — ABNORMAL HIGH (ref 12.0–15.0)
MCH: 29.2 pg (ref 26.0–34.0)
MCHC: 34.4 g/dL (ref 30.0–36.0)
MCV: 85 fL (ref 80.0–100.0)
Platelets: 385 10*3/uL (ref 150–400)
RBC: 5.34 MIL/uL — ABNORMAL HIGH (ref 3.87–5.11)
RDW: 14.6 % (ref 11.5–15.5)
WBC: 9.9 10*3/uL (ref 4.0–10.5)
nRBC: 0 % (ref 0.0–0.2)

## 2023-12-12 LAB — TROPONIN I (HIGH SENSITIVITY): Troponin I (High Sensitivity): <2 ng/L (ref <18)

## 2023-12-12 LAB — CBG MONITORING, ED: Glucose-Capillary: 274 mg/dL — ABNORMAL HIGH (ref 70–99)

## 2023-12-12 MED ORDER — ONDANSETRON 4 MG PO TBDP: 4.0000 mg | ORAL_TABLET | ORAL | 0 refills | Status: DC | PRN

## 2023-12-12 MED ORDER — ONDANSETRON 4 MG PO TBDP
4.0000 mg | ORAL_TABLET | Freq: Once | ORAL | Status: AC
  Administered 2023-12-12: 4 mg via ORAL
  Filled 2023-12-12: qty 1

## 2023-12-12 MED ORDER — TRAMADOL HCL 50 MG PO TABS: ORAL_TABLET | ORAL | 0 refills | Status: DC

## 2023-12-12 MED ORDER — TRAMADOL HCL 50 MG PO TABS
50.0000 mg | ORAL_TABLET | Freq: Once | ORAL | Status: AC
  Administered 2023-12-12: 50 mg via ORAL
  Filled 2023-12-12: qty 1

## 2023-12-12 MED ORDER — ACETAMINOPHEN 500 MG PO TABS
1000.0000 mg | ORAL_TABLET | Freq: Once | ORAL | Status: AC
  Administered 2023-12-12: 1000 mg via ORAL
  Filled 2023-12-12: qty 2

## 2023-12-12 NOTE — ED Triage Notes (Addendum)
 Pt from home , pt told EMS that she tripped and fell, pt tells this RN that she passed out. Pt c/o pain all over, head, chest , back , legs. Pt is not on thinners, endorses hitting head. Pt CBG for EMS was 300, pt reports DM2, and took insulin  today. CBG in triage 274.

## 2023-12-12 NOTE — ED Provider Notes (Signed)
 [place] EMERGENCY DEPARTMENT AT [facility] Provider Note   CSN: [id number] Arrival date & time: 12/12/23  0459     History  Chief Complaint  Patient presents with   [name]    [name] [name] is a 63 y.o. female.  HPI Patient reports that she lost her balance or got lightheaded yesterday evening.  She reports that she was talking to her husband and before he could come and assist her, she had fallen backwards and struck her head on a door frame.  She patient was equivocal on whether there was loss of consciousness.  She reports now she has a lot of headache at the back of her head.  She also reports pain in her central upper chest because she has sternal fracture.  She reports that happened a while ago but is still very painful for her.  (I reviewed prior imaging and CT scan 12\28 radiology interpretation is for a probable healing nondisplaced fracture of the upper sternum).  Patient reports her whole upper chest is sore with movements or deep breath.  Patient reports that she took her nightly insulin  last night but has not yet had her morning insulin .    Home Medications Prior to Admission medications   Medication Sig Start Date End Date Taking? Authorizing Provider  ondansetron  (ZOFRAN -ODT) 4 MG disintegrating tablet Take 1 tablet (4 mg total) by mouth every 4 (four) hours as needed. 12/12/23  Yes Armenta Canning, MD  traMADol  (ULTRAM ) 50 MG tablet 1 to 2 tablets every 6 hours with extra strength Tylenol  for pain as needed 12/12/23  Yes Tiombe Tomeo, Canning, MD  aspirin  EC 81 MG tablet Take 81 mg by mouth daily. Swallow whole.    [provider]  azelastine  (ASTELIN ) 0.1 % nasal spray Place 1 spray into both nostrils as needed for rhinitis or allergies. 11/04/23   [provider]  Continuous Blood Gluc Transmit (DEXCOM G6 TRANSMITTER) MISC USE AS DIRECTED FOR CONTINUOUS GLUCOSE MONITORING. REUSE TRANSMITTER X 90DAYS THEN DISCARD & REPLACE 07/05/21   Bedsole, Amy E,  MD  escitalopram  (LEXAPRO ) 10 MG tablet Take 1 tablet (10 mg total) by mouth daily. 11/18/23   Sebastian Toribio GAILS, MD  Glucagon, rDNA, (GLUCAGON EMERGENCY) 1 MG KIT Inject 1 mg into the skin See admin instructions. Inject one mg into the skin see administration instructions. Follow package directions for low blood sugar. 06/18/23   [provider]  levothyroxine  (SYNTHROID ) 137 MCG tablet Take 137 mcg by mouth daily before breakfast. 03/19/22   [provider]  metoCLOPramide  (REGLAN ) 5 MG tablet Take 1 tablet (5 mg total) by mouth 3 (three) times daily before meals. 11/12/23   Danford, Lonni SQUIBB, MD  NOVOLOG  FLEXPEN 100 UNIT/ML FlexPen Inject 10 Units into the skin 3 (three) times daily with meals. Plus sliding scale TID 12/08/23   [name], MD  omega-3 acid ethyl esters (LOVAZA ) 1 g capsule Take 2 capsules (2 g total) by mouth 2 (two) times daily. 09/13/23   Will Almarie MATSU, MD  pantoprazole  (PROTONIX ) 40 MG tablet Take 1 tablet (40 mg total) by mouth 2 (two) times daily. 11/18/23   Sebastian Toribio GAILS, MD  QUEtiapine  (SEROQUEL ) 50 MG tablet Take 50 mg by mouth at bedtime. 09/09/23   [provider]  rosuvastatin  (CRESTOR ) 10 MG tablet Take 1 tablet (10 mg total) by mouth daily. 11/18/23   Sebastian Toribio GAILS, MD  sucralfate  (CARAFATE ) 1 g tablet Take 1 tablet (1 g total) by mouth  2 (two) times daily. Do not take any other medication within 2-4 hours. 11/18/23   Sebastian Toribio GAILS, MD  tamsulosin  (FLOMAX ) 0.4 MG CAPS capsule Take 1 capsule (0.4 mg total) by mouth daily after supper. 09/04/23   Amin, Sumayya, MD  TOUJEO  SOLOSTAR 300 UNIT/ML Solostar Pen Inject 25 Units into the skin at bedtime. 12/08/23   [name], MD  traZODone  (DESYREL ) 50 MG tablet Take 50 mg by mouth at bedtime.    [provider]      Allergies    Codeine    Review of Systems   Review of Systems  Physical Exam Updated Vital Signs BP (!) 138/101   Pulse 78   Temp  98.1 F (36.7 C) (Oral)   Resp 16   LMP 08/11/2012   SpO2 98%  Physical Exam Constitutional:      Appearance: Normal appearance.     Comments: Patient is alert nontoxic.  No respiratory distress.  Mental status clear.  HENT:     Head: Normocephalic and atraumatic.     Comments: Reports pain to the back of the head but no objective hematoma or abrasions present.    Nose: Nose normal.     Mouth/Throat:     Mouth: Mucous membranes are moist.     Pharynx: Oropharynx is clear.     Comments: No dental or facial injury. Eyes:     Extraocular Movements: Extraocular movements intact.     Pupils: Pupils are equal, round, and reactive to light.  Cardiovascular:     Rate and Rhythm: Normal rate and regular rhythm.  Pulmonary:     Effort: Pulmonary effort is normal.     Breath sounds: Normal breath sounds.     Comments: Patient endorses chest discomfort diffusely over the upper chest.  No crepitus.  No visual abrasions or contusions.  posterior thorax no contusions or abrasions. Abdominal:     General: There is no distension.     Palpations: Abdomen is soft.     Tenderness: There is no abdominal tenderness. There is no guarding.  Musculoskeletal:        General: No swelling, deformity or signs of injury. Normal range of motion.     Cervical back: Neck supple.  Skin:    General: Skin is warm and dry.  Neurological:     General: No focal deficit present.     Mental Status: She is oriented to person, place, and time.     Motor: No weakness.     Coordination: Coordination normal.  Psychiatric:        Mood and Affect: Mood normal.     ED Results / Procedures / Treatments   Labs (all labs ordered are listed, but only abnormal results are displayed) Labs Reviewed  BASIC METABOLIC PANEL - Abnormal; Notable for the following components:      Result Value   Sodium 128 (*)    Chloride 90 (*)    Glucose, Bld 270 (*)    BUN 25 (*)    All other components within normal limits  CBC -  Abnormal; Notable for the following components:   RBC 5.34 (*)    Hemoglobin 15.6 (*)    All other components within normal limits  CBG MONITORING, ED - Abnormal; Notable for the following components:   Glucose-Capillary 274 (*)    All other components within normal limits  TROPONIN I (HIGH SENSITIVITY)  TROPONIN I (HIGH SENSITIVITY)    EKG EKG Interpretation Date/Time:  Thursday  December 12 2023 05:11:15 EST Ventricular Rate:  93 PR Interval:  161 QRS Duration:  83 QT Interval:  363 QTC Calculation: 452 R Axis:   92  Text Interpretation: Sinus rhythm Right axis deviation Borderline T abnormalities, inferior leads no sig change from previous. no acute ischemic appearance Confirmed by Armenta Canning [id number]) on 12/12/2023 9:51:31 AM  Radiology CT Head Wo Contrast Result Date: 12/12/2023 CLINICAL DATA:  Head trauma, moderate-severe.  Fall.  Headache. EXAM: CT HEAD WITHOUT CONTRAST TECHNIQUE: Contiguous axial images were obtained from the base of the skull through the vertex without intravenous contrast. RADIATION DOSE REDUCTION: This exam was performed according to the departmental dose-optimization program which includes automated exposure control, adjustment of the mA and/or kV according to patient size and/or use of iterative reconstruction technique. COMPARISON:  Head CT 11/22/2021. FINDINGS: Brain: No acute intracranial hemorrhage. Gray-white differentiation is preserved. No hydrocephalus or extra-axial collection. No mass effect or midline shift. Vascular: No hyperdense vessel or unexpected calcification. Skull: No calvarial fracture or suspicious bone lesion. Skull base is unremarkable. Sinuses/Orbits: No acute finding. Other: None. IMPRESSION: No acute intracranial abnormality. Electronically Signed   By: Ryan Chess M.D.   On: 12/12/2023 09:57   DG Chest 2 View Result Date: 12/12/2023 CLINICAL DATA:  63 year old female with history of chest pain and shortness of breath. EXAM: CHEST  - 2 VIEW COMPARISON:  Chest x-ray 12/10/2023. FINDINGS: Lung volumes are normal. No consolidative airspace disease. No pleural effusions. No pneumothorax. No pulmonary nodule or mass noted. Pulmonary vasculature and the cardiomediastinal silhouette are within normal limits. IMPRESSION: No radiographic evidence of acute cardiopulmonary disease. Electronically Signed   By: Toribio Aye M.D.   On: 12/12/2023 06:04    Procedures Procedures    Medications Ordered in ED Medications  ondansetron  (ZOFRAN -ODT) disintegrating tablet 4 mg (4 mg Oral Given 12/12/23 0939)  traMADol  (ULTRAM ) tablet 50 mg (50 mg Oral Given 12/12/23 0939)  acetaminophen  (TYLENOL ) tablet 1,000 mg (1,000 mg Oral Given 12/12/23 0939)  traMADol  (ULTRAM ) tablet 50 mg (50 mg Oral Given 12/12/23 1245)    ED Course/ Medical Decision Making/ A&P                                 Medical Decision Making Amount and/or Complexity of Data Reviewed Labs: ordered. Radiology: ordered.  Risk OTC drugs. Prescription drug management.   Presents as outlined.  Is reporting headache from a fall.  Patient has comorbid conditions of poorly controlled diabetes and multiple medical comorbidities.  Patient reported feeling lightheaded upon standing and then subsequently falling.  At this time we will proceed with basic evaluation of electrolytes and CBC as well as CT scan of the head and chest x-ray.  Patient's mental status is clear.  She shows no signs of confusion, all movements are coordinated purposeful with intact neurologic exam.  At this time very low suspicion for CVA as precipitating factor for fall and subsequent symptoms.  She also describes chest pain however this is very diffuse over the upper chest and correlates with prior healing sternal fracture.  Pain is reproducible.  Will obtain troponin and EKG as well.  Troponin less than 2 sodium 128 chloride 90 glucose 278 BUN 25 creatinine 0.9 GFR greater than 60.  Corrected sodium is  131.  White count 9.9 hemoglobin 15.6 platelets 385.  CT head interpreted by radiology no acute findings, CT chest x-ray interpreted by radiology no acute findings.  Patient requested pain medications for her upper chest pain and headache.  Treated with tramadol  and extra strength Tylenol .  Patient is well in appearance.  I have made several rechecks she remains very alert and shows no signs of neurologic dysfunction.  CT head shows no acute findings.  Patient has not had any active vomiting in the emergency department.  She has mild hyponatremia.  With glucose correction level is 131.  This is suitable for home management with hydration with electrolyte solution and some additional salt to the diet.  As well I encouraged careful management of blood sugar for this.  Patient does not show any signs of DKA.  Blood sugars are elevated at 270 but no signs of acute complication at this time.  Reviewed home care and recommended follow-up are included in discharge instructions.        Final Clinical Impression(s) / ED Diagnoses Final diagnoses:  Fall, initial encounter  Hyponatremia    Rx / DC Orders ED Discharge Orders          Ordered    ondansetron  (ZOFRAN -ODT) 4 MG disintegrating tablet  Every 4 hours PRN        12/12/23 1238    traMADol  (ULTRAM ) 50 MG tablet        12/12/23 1238              Armenta Canning, MD 12/12/23 1308

## 2023-12-12 NOTE — Discharge Instructions (Addendum)
 1.  Your CT scan does not show any evidence of brain injury or bleeding from your fall.  You also had an x-ray of the chest and no rib fractures or other abnormality was identified at this time.  You can continue to treat for pain with extra strength Tylenol  every 6 hours with 1-2 tramadol  tablets.  Use the tramadol  very sparingly only for the next few days and then discontinue. 2.  Have been prescribed Zofran  to take for nausea.  Take this if needed. 3.  Your sodium level is lower than normal.  This can occur from drinking too much water without any additional electrolytes in it, for control of your blood sugar, certain medications, vomiting and diarrhea.  At this time, you need to hydrate with fluids that have sodium and potassium in them such as sugar-free sports drinks.  You can also add some additional salt to your regular diet.  Will need this lab value rechecked within 24 to 48 hours.  Call your doctor to schedule repeat lab work and a follow-up visit.

## 2023-12-17 ENCOUNTER — Encounter: Payer: Self-pay | Admitting: Gastroenterology

## 2023-12-25 ENCOUNTER — Ambulatory Visit: Payer: BC Managed Care – PPO | Admitting: Gastroenterology

## 2023-12-25 ENCOUNTER — Encounter: Payer: Self-pay | Admitting: Gastroenterology

## 2023-12-25 VITALS — BP 181/91 | HR 99 | Temp 97.9°F | Resp 11 | Ht 59.0 in | Wt 125.0 lb

## 2023-12-25 DIAGNOSIS — K269 Duodenal ulcer, unspecified as acute or chronic, without hemorrhage or perforation: Secondary | ICD-10-CM | POA: Diagnosis not present

## 2023-12-25 DIAGNOSIS — K3189 Other diseases of stomach and duodenum: Secondary | ICD-10-CM | POA: Diagnosis not present

## 2023-12-25 DIAGNOSIS — K209 Esophagitis, unspecified without bleeding: Secondary | ICD-10-CM | POA: Diagnosis not present

## 2023-12-25 DIAGNOSIS — K2289 Other specified disease of esophagus: Secondary | ICD-10-CM

## 2023-12-25 DIAGNOSIS — R112 Nausea with vomiting, unspecified: Secondary | ICD-10-CM

## 2023-12-25 DIAGNOSIS — K229 Disease of esophagus, unspecified: Secondary | ICD-10-CM

## 2023-12-25 DIAGNOSIS — K2970 Gastritis, unspecified, without bleeding: Secondary | ICD-10-CM

## 2023-12-25 MED ORDER — ONDANSETRON HCL 4 MG PO TABS: 8.0000 mg | ORAL_TABLET | Freq: Three times a day (TID) | ORAL | 2 refills | Status: DC | PRN

## 2023-12-25 MED ORDER — PANTOPRAZOLE SODIUM 40 MG PO TBEC: 40.0000 mg | DELAYED_RELEASE_TABLET | Freq: Two times a day (BID) | ORAL | 6 refills | Status: DC

## 2023-12-25 MED ORDER — SUCRALFATE 1 G PO TABS: 1.0000 g | ORAL_TABLET | Freq: Two times a day (BID) | ORAL | 1 refills | Status: DC

## 2023-12-25 MED ORDER — SODIUM CHLORIDE 0.9 % IV SOLN: 500.0000 mL | Freq: Once | INTRAVENOUS | Status: DC

## 2023-12-25 NOTE — Progress Notes (Signed)
 Called to room to assist during endoscopic procedure.  Patient ID and intended procedure confirmed with present staff. Received instructions for my participation in the procedure from the performing physician.

## 2023-12-25 NOTE — Patient Instructions (Signed)
-   Resume previous diet.  - Continue twice daily PPI (60/6). - Carafate  1 g twice daily tablets (60/1). - Zofran  refills to be given to the patient to use as needed. - Continue present medications. - Await pathology results. - Follow-up in clinic in 3 to 4 months.   YOU HAD AN ENDOSCOPIC PROCEDURE TODAY AT THE [facility]:   Refer to the procedure report that was given to you for any specific questions about what was found during the examination.  If the procedure report does not answer your questions, please call your gastroenterologist to clarify.  If you requested that your care partner not be given the details of your procedure findings, then the procedure report has been included in a sealed envelope for you to review at your convenience later.  YOU SHOULD EXPECT: Some feelings of bloating in the abdomen. Passage of more gas than usual.  Walking can help get rid of the air that was put into your GI tract during the procedure and reduce the bloating. If you had a lower endoscopy (such as a colonoscopy or flexible sigmoidoscopy) you may notice spotting of blood in your stool or on the toilet paper. If you underwent a bowel prep for your procedure, you may not have a normal bowel movement for a few days.  Please Note:  You might notice some irritation and congestion in your nose or some drainage.  This is from the oxygen used during your procedure.  There is no need for concern and it should clear up in a day or so.  SYMPTOMS TO REPORT IMMEDIATELY: Following upper endoscopy (EGD)  Vomiting of blood or coffee ground material  New chest pain or pain under the shoulder blades  Painful or persistently difficult swallowing  New shortness of breath  Fever of 100F or higher  Black, tarry-looking stools  For urgent or emergent issues, a gastroenterologist can be reached at any hour by calling [phone]. Do not use MyChart messaging for urgent concerns.    DIET:  We do  recommend a small meal at first, but then you may proceed to your regular diet.  Drink plenty of fluids but you should avoid alcoholic beverages for 24 hours.  ACTIVITY:  You should plan to take it easy for the rest of today and you should NOT DRIVE or use heavy machinery until tomorrow (because of the sedation medicines used during the test).    FOLLOW UP: Our staff will call the number listed on your records the next business day following your procedure.  We will call around 7:15- 8:00 am to check on you and address any questions or concerns that you may have regarding the information given to you following your procedure. If we do not reach you, we will leave a message.     If any biopsies were taken you will be contacted by phone or by letter within the next 1-3 weeks.  Please call us  at [phone] if you have not heard about the biopsies in 3 weeks.    SIGNATURES/CONFIDENTIALITY: You and/or your care partner have signed paperwork which will be entered into your electronic medical record.  These signatures attest to the fact that that the information above on your After Visit Summary has been reviewed and is understood.  Full responsibility of the confidentiality of this discharge information lies with you and/or your care-partner.

## 2023-12-25 NOTE — Progress Notes (Signed)
 Vss nad trans to pacu

## 2023-12-25 NOTE — Progress Notes (Signed)
 GASTROENTEROLOGY PROCEDURE H&P NOTE   Primary Care Physician: Skeeter Dukes, NP  HPI: [name] [name] is a 63 y.o. female who presents for EGD for followup of Esophagitis, Duodenal ulcers, Nausea, Vomiting.  Past Medical History:  Diagnosis Date   Allergy    seasonal   Anxiety    B12 deficiency    Chest pain 04/05/2017   CHF (congestive heart failure) ([name])    Diabetes mellitus type 1, uncontrolled    "dr. 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) 10/29/2013   Leg cramping    "@ night" (09/18/2013)   Migraine    "once q couple months" (11/26/2018)   Osteoporosis    Peripheral neuropathy    Umbilical hernia    unrepaired (09/18/2013)   Past Surgical History:  Procedure Laterality Date   BIOPSY  06/03/2019   Procedure: BIOPSY;  Surgeon: Lajuan Pila, MD;  Location: [facility];  Service: Endoscopy;;   BIOPSY  08/17/2020   Procedure: BIOPSY;  Surgeon: Lindle Rhea, MD;  Location: [facility];  Service: Gastroenterology;;   BIOPSY  02/05/2023   Procedure: BIOPSY;  Surgeon: Asencion Blacksmith, MD;  Location: [facility];  Service: Gastroenterology;;   BIOPSY  09/24/2023   Procedure: BIOPSY;  Surgeon: Normie Becton., MD;  Location: [facility];  Service: Gastroenterology;;   CESAREAN SECTION  1989   ENTEROSCOPY N/A 06/03/2019   Procedure: ENTEROSCOPY;  Surgeon: Lajuan Pila, MD;  Location: [facility];  Service: Endoscopy;  Laterality: N/A;   ESOPHAGOGASTRODUODENOSCOPY (EGD) WITH PROPOFOL  N/A 08/17/2020   Procedure: ESOPHAGOGASTRODUODENOSCOPY (EGD) WITH PROPOFOL ;  Surgeon: Lindle Rhea, MD;  Location: [facility];  Service: Gastroenterology;  Laterality: N/A;   ESOPHAGOGASTRODUODENOSCOPY (EGD)  WITH PROPOFOL  N/A 02/05/2023   Procedure: ESOPHAGOGASTRODUODENOSCOPY (EGD) WITH PROPOFOL ;  Surgeon: Asencion Blacksmith, MD;  Location: [facility];  Service: Gastroenterology;  Laterality: N/A;   ESOPHAGOGASTRODUODENOSCOPY (EGD) WITH PROPOFOL  N/A 09/24/2023   Procedure: ESOPHAGOGASTRODUODENOSCOPY (EGD) WITH PROPOFOL ;  Surgeon: Brice Campi Albino Alu., MD;  Location: [facility];  Service: Gastroenterology;  Laterality: N/A;   EUS N/A 09/24/2023   Procedure: UPPER ENDOSCOPIC ULTRASOUND (EUS) LINEAR;  Surgeon: Normie Becton., MD;  Location: [facility];  Service: Gastroenterology;  Laterality: N/A;   LAPAROSCOPIC ASSISTED VAGINAL HYSTERECTOMY  09/23/2012   Procedure: LAPAROSCOPIC ASSISTED VAGINAL HYSTERECTOMY;  Surgeon: Dyanna Glasgow, DO;  Location: [facility];  Service: Gynecology;  Laterality: N/A;  pull Dr Lorrine Rotunda instrument   LEFT HEART CATH AND CORONARY ANGIOGRAPHY N/A 02/11/2017   Procedure: Left Heart Cath and Coronary Angiography;  Surgeon: Avanell Leigh, MD;  Location: [facility] INVASIVE CV LAB;  Service: Cardiovascular;  Laterality: N/A;   TUBAL LIGATION Bilateral 1992   Current Outpatient Medications  Medication Sig Dispense Refill   aspirin  EC 81 MG tablet Take 81 mg by mouth daily. Swallow whole.     azelastine  (ASTELIN ) 0.1 % nasal spray Place 1 spray into both nostrils as needed for rhinitis or allergies.     Continuous Blood Gluc Transmit (DEXCOM G6 TRANSMITTER) MISC USE AS DIRECTED FOR CONTINUOUS GLUCOSE MONITORING. REUSE TRANSMITTER X 90DAYS THEN DISCARD & REPLACE 1 each 3   escitalopram  (LEXAPRO ) 10 MG tablet Take 1 tablet (10 mg total) by mouth daily. 30 tablet 1  Glucagon, rDNA, (GLUCAGON EMERGENCY) 1 MG KIT Inject 1 mg into the skin See admin instructions. Inject one mg into the skin see administration instructions. Follow package directions for low blood sugar.     levothyroxine  (SYNTHROID ) 137 MCG tablet Take 137 mcg by mouth daily before breakfast.     metoCLOPramide  (REGLAN )  5 MG tablet Take 1 tablet (5 mg total) by mouth 3 (three) times daily before meals. 90 tablet 2   NOVOLOG  FLEXPEN 100 UNIT/ML FlexPen Inject 10 Units into the skin 3 (three) times daily with meals. Plus sliding scale TID 15 mL 0   omega-3 acid ethyl esters (LOVAZA ) 1 g capsule Take 2 capsules (2 g total) by mouth 2 (two) times daily. 120 capsule 4   ondansetron  (ZOFRAN -ODT) 4 MG disintegrating tablet Take 1 tablet (4 mg total) by mouth every 4 (four) hours as needed. 20 tablet 0   pantoprazole  (PROTONIX ) 40 MG tablet Take 1 tablet (40 mg total) by mouth 2 (two) times daily. 60 tablet 1   QUEtiapine  (SEROQUEL ) 50 MG tablet Take 50 mg by mouth at bedtime.     rosuvastatin  (CRESTOR ) 10 MG tablet Take 1 tablet (10 mg total) by mouth daily. 30 tablet 1   sucralfate  (CARAFATE ) 1 g tablet Take 1 tablet (1 g total) by mouth 2 (two) times daily. Do not take any other medication within 2-4 hours. 60 tablet 1   tamsulosin  (FLOMAX ) 0.4 MG CAPS capsule Take 1 capsule (0.4 mg total) by mouth daily after supper. 30 capsule 1   TOUJEO  SOLOSTAR 300 UNIT/ML Solostar Pen Inject 25 Units into the skin at bedtime. 15 mL 1   traMADol  (ULTRAM ) 50 MG tablet 1 to 2 tablets every 6 hours with extra strength Tylenol  for pain as needed 20 tablet 0   traZODone  (DESYREL ) 50 MG tablet Take 50 mg by mouth at bedtime.     No current facility-administered medications for this visit.    Current Outpatient Medications:    aspirin  EC 81 MG tablet, Take 81 mg by mouth daily. Swallow whole., Disp: , Rfl:    azelastine  (ASTELIN ) 0.1 % nasal spray, Place 1 spray into both nostrils as needed for rhinitis or allergies., Disp: , Rfl:    Continuous Blood Gluc Transmit (DEXCOM G6 TRANSMITTER) MISC, USE AS DIRECTED FOR CONTINUOUS GLUCOSE MONITORING. REUSE TRANSMITTER X 90DAYS THEN DISCARD & REPLACE, Disp: 1 each, Rfl: 3   escitalopram  (LEXAPRO ) 10 MG tablet, Take 1 tablet (10 mg total) by mouth daily., Disp: 30 tablet, Rfl: 1   Glucagon,  rDNA, (GLUCAGON EMERGENCY) 1 MG KIT, Inject 1 mg into the skin See admin instructions. Inject one mg into the skin see administration instructions. Follow package directions for low blood sugar., Disp: , Rfl:    levothyroxine  (SYNTHROID ) 137 MCG tablet, Take 137 mcg by mouth daily before breakfast., Disp: , Rfl:    metoCLOPramide  (REGLAN ) 5 MG tablet, Take 1 tablet (5 mg total) by mouth 3 (three) times daily before meals., Disp: 90 tablet, Rfl: 2   NOVOLOG  FLEXPEN 100 UNIT/ML FlexPen, Inject 10 Units into the skin 3 (three) times daily with meals. Plus sliding scale TID, Disp: 15 mL, Rfl: 0   omega-3 acid ethyl esters (LOVAZA ) 1 g capsule, Take 2 capsules (2 g total) by mouth 2 (two) times daily., Disp: 120 capsule, Rfl: 4   ondansetron  (ZOFRAN -ODT) 4 MG disintegrating tablet, Take 1 tablet (4 mg total) by mouth every 4 (four) hours as needed., Disp: 20 tablet, Rfl: 0  pantoprazole  (PROTONIX ) 40 MG tablet, Take 1 tablet (40 mg total) by mouth 2 (two) times daily., Disp: 60 tablet, Rfl: 1   QUEtiapine  (SEROQUEL ) 50 MG tablet, Take 50 mg by mouth at bedtime., Disp: , Rfl:    rosuvastatin  (CRESTOR ) 10 MG tablet, Take 1 tablet (10 mg total) by mouth daily., Disp: 30 tablet, Rfl: 1   sucralfate  (CARAFATE ) 1 g tablet, Take 1 tablet (1 g total) by mouth 2 (two) times daily. Do not take any other medication within 2-4 hours., Disp: 60 tablet, Rfl: 1   tamsulosin  (FLOMAX ) 0.4 MG CAPS capsule, Take 1 capsule (0.4 mg total) by mouth daily after supper., Disp: 30 capsule, Rfl: 1   TOUJEO  SOLOSTAR 300 UNIT/ML Solostar Pen, Inject 25 Units into the skin at bedtime., Disp: 15 mL, Rfl: 1   traMADol  (ULTRAM ) 50 MG tablet, 1 to 2 tablets every 6 hours with extra strength Tylenol  for pain as needed, Disp: 20 tablet, Rfl: 0   traZODone  (DESYREL ) 50 MG tablet, Take 50 mg by mouth at bedtime., Disp: , Rfl:  Allergies  Allergen Reactions   Codeine Hives, Anxiety and Other (See Comments)   Family History  Problem  Relation Age of Onset   Diabetes Other    Heart disease Other    Heart failure Mother 83       Her heart skipped a beat and stopped - per pt   Diabetes Maternal Grandmother    Alzheimer's disease Maternal Grandmother    Coronary artery disease Maternal Grandmother    Heart attack Maternal Grandfather    Heart failure Brother    Diabetes Brother    Colon polyps Neg Hx    Esophageal cancer Neg Hx    Liver cancer Neg Hx    Pancreatic cancer Neg Hx    Stomach cancer Neg Hx    Crohn's disease Neg Hx    Rectal cancer Neg Hx    Social History   Socioeconomic History   Marital status: Married    Spouse name: Not on file   Number of children: 2   Years of education: Not on file   Highest education level: Not on file  Occupational History   Occupation: UNEMPLOYED    Employer: UNEMPLOYED    Comment: homemaker  Tobacco Use   Smoking status: Former    Current packs/day: 0.00    Average packs/day: 0.1 packs/day for 10.0 years (1.2 ttl pk-yrs)    Types: Cigarettes    Start date: 12/10/1980    Quit date: 12/10/1990    Years since quitting: 33.0   Smokeless tobacco: Never  Vaping Use   Vaping status: Never Used  Substance and Sexual Activity   Alcohol use: Never    Alcohol/week: 0.0 standard drinks of alcohol   Drug use: Not Currently    Types: Marijuana    Comment: last use 2 days ago   Sexual activity: Not Currently  Other Topics Concern   Not on file  Social History Narrative   Regular exercise- no    Diet- lots of mountain dew, limits fast foods    Social Drivers of Health   Financial Resource Strain: Low Risk  (01/19/2023)   Received from [facility]   Overall Financial Resource Strain (CARDIA)    Difficulty of Paying Living Expenses: Not hard at all  Recent Concern: Financial Resource Strain - Medium Risk (12/26/2022)   Received from [facility]   Overall Financial Resource Strain (CARDIA)    Difficulty of Paying  Living Expenses: Somewhat hard  Food  Insecurity: No Food Insecurity (11/14/2023)   Hunger Vital Sign    Worried About Running Out of Food in the Last Year: Never true    Ran Out of Food in the Last Year: Never true  Transportation Needs: No Transportation Needs (11/14/2023)   PRAPARE - Administrator, Civil Service (Medical): No    Lack of Transportation (Non-Medical): No  Physical Activity: Unknown (01/19/2023)   Received from [facility], [facility]   Exercise Vital Sign    Days of Exercise per Week: 0 days    Minutes of Exercise per Session: Not on file  Stress: Stress Concern Present (12/26/2022)   Received from [facility], [facility] - Occupational Stress Questionnaire    Feeling of Stress : Rather much  Social Connections: Moderately Integrated (01/19/2023)   Received from [facility], [facility]   Social Network    How would you rate your social network (family, work, friends)?: Adequate participation with social networks  Intimate Partner Violence: Not At Risk (11/14/2023)   Humiliation, Afraid, Rape, and Kick questionnaire    Fear of Current or Ex-Partner: No    Emotionally Abused: No    Physically Abused: No    Sexually Abused: No    Physical Exam: There were no vitals filed for this visit. There is no height or weight on file to calculate BMI. GEN: NAD EYE: Sclerae anicteric ENT: MMM CV: Non-tachycardic GI: Soft, NT/ND NEURO:  Alert & Oriented x 3  Lab Results: No results for input(s): "WBC", "HGB", "HCT", "PLT" in the last 72 hours. BMET No results for input(s): "NA", "K", "CL", "CO2", "GLUCOSE", "BUN", "CREATININE", "CALCIUM " in the last 72 hours. LFT No results for input(s): "PROT", "ALBUMIN", "AST", "ALT", "ALKPHOS", "BILITOT", "BILIDIR", "IBILI" in the last 72 hours. PT/INR No results for input(s): "LABPROT", "INR" in the last 72 hours.   Impression / Plan: This is a 63 y.o.female who presents for EGD for followup of  Esophagitis, Duodenal ulcers, Nausea, Vomiting.  The risks and benefits of endoscopic evaluation/treatment were discussed with the patient and/or family; these include but are not limited to the risk of perforation, infection, bleeding, missed lesions, lack of diagnosis, severe illness requiring hospitalization, as well as anesthesia and sedation related illnesses.  The patient's history has been reviewed, patient examined, no change in status, and deemed stable for procedure.  The patient and/or family is agreeable to proceed.    Yong Henle, MD [facility] Office # [phone]

## 2023-12-25 NOTE — Op Note (Signed)
 [facility] Patient Name: [name] [name] Procedure Date: 12/25/2023 10:13 AM MRN: [id number] Endoscopist: Yong Henle , MD, [id number] Age: 63 Referring MD:  Date of Birth: [birth year] Gender: Female Account #: [id number] Procedure:                Upper GI endoscopy Indications:              Follow-up of duodenal ulcer, Follow-up of                            esophagitis, Nausea with vomiting Medicines:                Monitored Anesthesia Care Procedure:                Pre-Anesthesia Assessment:                           - Prior to the procedure, a History and Physical                            was performed, and patient medications and                            allergies were reviewed. The patient's tolerance of                            previous anesthesia was also reviewed. The risks                            and benefits of the procedure and the sedation                            options and risks were discussed with the patient.                            All questions were answered, and informed consent                            was obtained. Prior Anticoagulants: The patient has                            taken no anticoagulant or antiplatelet agents. ASA                            Grade Assessment: II - A patient with mild systemic                            disease. After reviewing the risks and benefits,                            the patient was deemed in satisfactory condition to                            undergo the procedure.  After obtaining informed consent, the endoscope was                            passed under direct vision. Throughout the                            procedure, the patient's blood pressure, pulse, and                            oxygen saturations were monitored continuously. The                            Olympus Scope [id number] was introduced through the                            mouth, and advanced  to the second part of duodenum.                            The upper GI endoscopy was accomplished without                            difficulty. The patient tolerated the procedure. Scope In: Scope Out: Findings:                 No gross lesions were noted in the entire                            esophagus. Previous esophagitis has healed.                           The Z-line was irregular and was found 38 cm from                            the incisors.                           Diffuse moderate inflammation characterized by                            erythema and granularity was found in the entire                            examined stomach, with dispersed erosions                            throughout. Biopsies were taken with a cold forceps                            for histology and Helicobacter pylori testing.                           Diffuse mildly congested mucosa without active                            bleeding and with no  stigmata of bleeding was found                            in the duodenal bulb, in the first portion of the                            duodenum and in the second portion of the duodenum.                            However, previously noted duodenal ulcers have                            healed (suspect this is in the healing process                            still). Complications:            No immediate complications. Estimated Blood Loss:     Estimated blood loss was minimal. Impression:               - No gross lesions in the entire esophagus                            (previous esophagitis has healed). Z-line                            irregular, 38 cm from the incisors.                           - Gastritis. Biopsied.                           - Congested duodenal mucosa - previous duodenal                            ulcers have healed. Recommendation:           - The patient will be observed post-procedure,                            until all  discharge criteria are met.                           - Discharge patient to home.                           - Patient has a contact number available for                            emergencies. The signs and symptoms of potential                            delayed complications were discussed with the                            patient. Return to normal activities tomorrow.  Written discharge instructions were provided to the                            patient.                           - Resume previous diet.                           - Continue twice daily PPI (60/6).                           - Carafate  1 g twice daily tablets (60/1).                           - Zofran  refills to be given to the patient to use                            as needed.                           - Continue present medications.                           - Await pathology results.                           - Follow-up in clinic in 3 to 4 months.                           - The findings and recommendations were discussed                            with the patient.                           - The findings and recommendations were discussed                            with the patient's family. Yong Henle, MD 12/25/2023 10:33:15 AM

## 2023-12-26 ENCOUNTER — Telehealth: Payer: Self-pay | Admitting: *Deleted

## 2023-12-26 NOTE — Telephone Encounter (Signed)
  Follow up Call-     12/25/2023    9:22 AM  Call back number  Post procedure Call Back phone  # [phone]  Permission to leave phone message Yes     Patient questions:  Do you have a fever, pain , or abdominal swelling? No. Pain Score  0 *  Have you tolerated food without any problems? Yes.    Have you been able to return to your normal activities? Yes.    Do you have any questions about your discharge instructions: Diet   No. Medications  No. Follow up visit  No.  Do you have questions or concerns about your Care? No.  Actions: * If pain score is 4 or above: No action needed, pain <4.

## 2023-12-27 ENCOUNTER — Encounter: Payer: Self-pay | Admitting: Gastroenterology

## 2023-12-27 LAB — SURGICAL PATHOLOGY

## 2024-01-05 ENCOUNTER — Observation Stay (HOSPITAL_COMMUNITY)
Admission: EM | Admit: 2024-01-05 | Discharge: 2024-01-06 | Disposition: A | Discharge status: Home / Self Care | Payer: BC Managed Care – PPO | Attending: Family Medicine | Admitting: Family Medicine

## 2024-01-05 ENCOUNTER — Encounter (HOSPITAL_COMMUNITY): Payer: Self-pay

## 2024-01-05 DIAGNOSIS — E1010 Type 1 diabetes mellitus with ketoacidosis without coma: Secondary | ICD-10-CM | POA: Diagnosis present

## 2024-01-05 DIAGNOSIS — I2510 Atherosclerotic heart disease of native coronary artery without angina pectoris: Secondary | ICD-10-CM | POA: Diagnosis present

## 2024-01-05 DIAGNOSIS — E039 Hypothyroidism, unspecified: Secondary | ICD-10-CM | POA: Diagnosis not present

## 2024-01-05 DIAGNOSIS — E785 Hyperlipidemia, unspecified: Secondary | ICD-10-CM | POA: Diagnosis not present

## 2024-01-05 DIAGNOSIS — I5032 Chronic diastolic (congestive) heart failure: Secondary | ICD-10-CM | POA: Diagnosis not present

## 2024-01-05 DIAGNOSIS — E1065 Type 1 diabetes mellitus with hyperglycemia: Secondary | ICD-10-CM | POA: Diagnosis not present

## 2024-01-05 DIAGNOSIS — Z79899 Other long term (current) drug therapy: Secondary | ICD-10-CM | POA: Diagnosis not present

## 2024-01-05 DIAGNOSIS — K3184 Gastroparesis: Secondary | ICD-10-CM | POA: Diagnosis present

## 2024-01-05 DIAGNOSIS — Z1152 Encounter for screening for COVID-19: Secondary | ICD-10-CM | POA: Diagnosis not present

## 2024-01-05 DIAGNOSIS — N319 Neuromuscular dysfunction of bladder, unspecified: Secondary | ICD-10-CM | POA: Insufficient documentation

## 2024-01-05 DIAGNOSIS — I10 Essential (primary) hypertension: Secondary | ICD-10-CM | POA: Diagnosis present

## 2024-01-05 DIAGNOSIS — F32A Depression, unspecified: Secondary | ICD-10-CM | POA: Diagnosis not present

## 2024-01-05 DIAGNOSIS — R739 Hyperglycemia, unspecified: Secondary | ICD-10-CM | POA: Diagnosis present

## 2024-01-05 DIAGNOSIS — R112 Nausea with vomiting, unspecified: Secondary | ICD-10-CM | POA: Diagnosis present

## 2024-01-05 DIAGNOSIS — E873 Alkalosis: Secondary | ICD-10-CM | POA: Diagnosis not present

## 2024-01-05 DIAGNOSIS — Z8659 Personal history of other mental and behavioral disorders: Secondary | ICD-10-CM | POA: Insufficient documentation

## 2024-01-05 DIAGNOSIS — K219 Gastro-esophageal reflux disease without esophagitis: Secondary | ICD-10-CM | POA: Diagnosis not present

## 2024-01-05 DIAGNOSIS — K2990 Gastroduodenitis, unspecified, without bleeding: Principal | ICD-10-CM | POA: Insufficient documentation

## 2024-01-05 DIAGNOSIS — D751 Secondary polycythemia: Secondary | ICD-10-CM | POA: Diagnosis not present

## 2024-01-05 DIAGNOSIS — Z87891 Personal history of nicotine dependence: Secondary | ICD-10-CM | POA: Insufficient documentation

## 2024-01-05 DIAGNOSIS — I110 Hypertensive heart disease with heart failure: Secondary | ICD-10-CM | POA: Diagnosis not present

## 2024-01-05 DIAGNOSIS — E109 Type 1 diabetes mellitus without complications: Secondary | ICD-10-CM | POA: Diagnosis present

## 2024-01-05 DIAGNOSIS — K279 Peptic ulcer, site unspecified, unspecified as acute or chronic, without hemorrhage or perforation: Secondary | ICD-10-CM | POA: Diagnosis not present

## 2024-01-05 DIAGNOSIS — I480 Paroxysmal atrial fibrillation: Secondary | ICD-10-CM | POA: Diagnosis present

## 2024-01-05 DIAGNOSIS — E1310 Other specified diabetes mellitus with ketoacidosis without coma: Principal | ICD-10-CM

## 2024-01-05 DIAGNOSIS — Z7901 Long term (current) use of anticoagulants: Secondary | ICD-10-CM | POA: Diagnosis not present

## 2024-01-05 DIAGNOSIS — K2970 Gastritis, unspecified, without bleeding: Secondary | ICD-10-CM | POA: Diagnosis present

## 2024-01-05 LAB — CBC WITH DIFFERENTIAL/PLATELET
Abs Immature Granulocytes: 0.04 10*3/uL (ref 0.00–0.07)
Basophils Absolute: 0.1 10*3/uL (ref 0.0–0.1)
Basophils Relative: 1 %
Eosinophils Absolute: 0 10*3/uL (ref 0.0–0.5)
Eosinophils Relative: 0 %
HCT: 50.6 % — ABNORMAL HIGH (ref 36.0–46.0)
Hemoglobin: 15.6 g/dL — ABNORMAL HIGH (ref 12.0–15.0)
Immature Granulocytes: 1 %
Lymphocytes Relative: 14 %
Lymphs Abs: 1.1 10*3/uL (ref 0.7–4.0)
MCH: 27.7 pg (ref 26.0–34.0)
MCHC: 30.8 g/dL (ref 30.0–36.0)
MCV: 89.9 fL (ref 80.0–100.0)
Monocytes Absolute: 0.4 10*3/uL (ref 0.1–1.0)
Monocytes Relative: 5 %
Neutro Abs: 6.3 10*3/uL (ref 1.7–7.7)
Neutrophils Relative %: 79 %
Platelets: 352 10*3/uL (ref 150–400)
RBC: 5.63 MIL/uL — ABNORMAL HIGH (ref 3.87–5.11)
RDW: 15.1 % (ref 11.5–15.5)
WBC: 7.8 10*3/uL (ref 4.0–10.5)
nRBC: 0 % (ref 0.0–0.2)

## 2024-01-05 LAB — BASIC METABOLIC PANEL
Anion gap: 12 (ref 5–15)
Anion gap: 10 (ref 5–15)
Anion gap: 10 (ref 5–15)
BUN: 22 mg/dL — ABNORMAL HIGH (ref 6–20)
BUN: 21 mg/dL — ABNORMAL HIGH (ref 6–20)
BUN: 20 mg/dL (ref 6–20)
CO2: 31 mmol/L (ref 22–32)
CO2: 29 mmol/L (ref 22–32)
CO2: 27 mmol/L (ref 22–32)
Calcium: 8.8 mg/dL — ABNORMAL LOW (ref 8.9–10.3)
Calcium: 8.7 mg/dL — ABNORMAL LOW (ref 8.9–10.3)
Calcium: 8.5 mg/dL — ABNORMAL LOW (ref 8.9–10.3)
Chloride: 95 mmol/L — ABNORMAL LOW (ref 98–111)
Chloride: 97 mmol/L — ABNORMAL LOW (ref 98–111)
Chloride: 99 mmol/L (ref 98–111)
Creatinine, Ser: 0.54 mg/dL (ref 0.44–1.00)
Creatinine, Ser: 0.62 mg/dL (ref 0.44–1.00)
Creatinine, Ser: 0.61 mg/dL (ref 0.44–1.00)
GFR, Estimated: >60 mL/min (ref >60)
GFR, Estimated: >60 mL/min (ref >60)
GFR, Estimated: >60 mL/min (ref >60)
Glucose, Bld: 129 mg/dL — ABNORMAL HIGH (ref 70–99)
Glucose, Bld: 135 mg/dL — ABNORMAL HIGH (ref 70–99)
Glucose, Bld: 129 mg/dL — ABNORMAL HIGH (ref 70–99)
Potassium: 3.5 mmol/L (ref 3.5–5.1)
Potassium: 3.4 mmol/L — ABNORMAL LOW (ref 3.5–5.1)
Potassium: 4 mmol/L (ref 3.5–5.1)
Sodium: 138 mmol/L (ref 135–145)
Sodium: 136 mmol/L (ref 135–145)
Sodium: 136 mmol/L (ref 135–145)

## 2024-01-05 LAB — URINALYSIS, ROUTINE W REFLEX MICROSCOPIC
Bacteria, UA: NONE SEEN
Bilirubin Urine: NEGATIVE
Glucose, UA: >=500 mg/dL — AB
Hgb urine dipstick: NEGATIVE
Ketones, ur: 20 mg/dL — AB
Leukocytes,Ua: NEGATIVE
Nitrite: POSITIVE — AB
Protein, ur: 100 mg/dL — AB
Specific Gravity, Urine: 1.026 (ref 1.005–1.030)
pH: 5 (ref 5.0–8.0)

## 2024-01-05 LAB — COMPREHENSIVE METABOLIC PANEL
ALT: 10 U/L (ref 0–44)
AST: 12 U/L — ABNORMAL LOW (ref 15–41)
Albumin: 4.3 g/dL (ref 3.5–5.0)
Alkaline Phosphatase: 78 U/L (ref 38–126)
Anion gap: 22 — ABNORMAL HIGH (ref 5–15)
BUN: 26 mg/dL — ABNORMAL HIGH (ref 6–20)
CO2: 25 mmol/L (ref 22–32)
Calcium: 9.3 mg/dL (ref 8.9–10.3)
Chloride: 87 mmol/L — ABNORMAL LOW (ref 98–111)
Creatinine, Ser: 0.84 mg/dL (ref 0.44–1.00)
GFR, Estimated: >60 mL/min (ref >60)
Glucose, Bld: 542 mg/dL (ref 70–99)
Potassium: 3.6 mmol/L (ref 3.5–5.1)
Sodium: 134 mmol/L — ABNORMAL LOW (ref 135–145)
Total Bilirubin: 1.1 mg/dL (ref 0.0–1.2)
Total Protein: 8.1 g/dL (ref 6.5–8.1)

## 2024-01-05 LAB — GLUCOSE, CAPILLARY
Glucose-Capillary: 113 mg/dL — ABNORMAL HIGH (ref 70–99)
Glucose-Capillary: 141 mg/dL — ABNORMAL HIGH (ref 70–99)
Glucose-Capillary: 194 mg/dL — ABNORMAL HIGH (ref 70–99)

## 2024-01-05 LAB — CBG MONITORING, ED
Glucose-Capillary: 579 mg/dL (ref 70–99)
Glucose-Capillary: 398 mg/dL — ABNORMAL HIGH (ref 70–99)
Glucose-Capillary: 258 mg/dL — ABNORMAL HIGH (ref 70–99)
Glucose-Capillary: 161 mg/dL — ABNORMAL HIGH (ref 70–99)
Glucose-Capillary: 140 mg/dL — ABNORMAL HIGH (ref 70–99)
Glucose-Capillary: 152 mg/dL — ABNORMAL HIGH (ref 70–99)

## 2024-01-05 LAB — BLOOD GAS, VENOUS
Acid-Base Excess: 10.2 mmol/L — ABNORMAL HIGH (ref 0.0–2.0)
Bicarbonate: 35.1 mmol/L — ABNORMAL HIGH (ref 20.0–28.0)
O2 Saturation: 76.7 %
Patient temperature: 37
pCO2, Ven: 46 mm[Hg] (ref 44–60)
pH, Ven: 7.49 — ABNORMAL HIGH (ref 7.25–7.43)
pO2, Ven: 41 mm[Hg] (ref 32–45)

## 2024-01-05 LAB — TSH: TSH: 16.177 u[IU]/mL — ABNORMAL HIGH (ref 0.350–4.500)

## 2024-01-05 LAB — LIPASE, BLOOD: Lipase: 22 U/L (ref 11–51)

## 2024-01-05 LAB — MRSA NEXT GEN BY PCR, NASAL: MRSA by PCR Next Gen: NOT DETECTED

## 2024-01-05 LAB — BETA-HYDROXYBUTYRIC ACID
Beta-Hydroxybutyric Acid: 3.77 mmol/L — ABNORMAL HIGH (ref 0.05–0.27)
Beta-Hydroxybutyric Acid: 0.27 mmol/L (ref 0.05–0.27)

## 2024-01-05 LAB — RESP PANEL BY RT-PCR (RSV, FLU A&B, COVID)  RVPGX2
Influenza A by PCR: NEGATIVE
Influenza B by PCR: NEGATIVE
Resp Syncytial Virus by PCR: NEGATIVE
SARS Coronavirus 2 by RT PCR: NEGATIVE

## 2024-01-05 MED ORDER — ESCITALOPRAM OXALATE 10 MG PO TABS
10.0000 mg | ORAL_TABLET | Freq: Every day | ORAL | Status: DC
  Administered 2024-01-05 – 2024-01-06 (×2): 10 mg via ORAL
  Filled 2024-01-05 (×2): qty 1

## 2024-01-05 MED ORDER — ONDANSETRON HCL 4 MG PO TABS: 4.0000 mg | ORAL_TABLET | Freq: Four times a day (QID) | ORAL | Status: DC | PRN

## 2024-01-05 MED ORDER — TAMSULOSIN HCL 0.4 MG PO CAPS
0.4000 mg | ORAL_CAPSULE | Freq: Every day | ORAL | Status: DC
  Administered 2024-01-05: 0.4 mg via ORAL
  Filled 2024-01-05: qty 1

## 2024-01-05 MED ORDER — POTASSIUM CHLORIDE CRYS ER 20 MEQ PO TBCR
40.0000 meq | EXTENDED_RELEASE_TABLET | Freq: Once | ORAL | Status: AC
  Administered 2024-01-05: 40 meq via ORAL
  Filled 2024-01-05: qty 2

## 2024-01-05 MED ORDER — PANTOPRAZOLE SODIUM 40 MG PO TBEC
40.0000 mg | DELAYED_RELEASE_TABLET | Freq: Two times a day (BID) | ORAL | Status: DC
  Administered 2024-01-05 – 2024-01-06 (×2): 40 mg via ORAL
  Filled 2024-01-05 (×2): qty 1

## 2024-01-05 MED ORDER — FENTANYL CITRATE PF 50 MCG/ML IJ SOSY
50.0000 ug | PREFILLED_SYRINGE | Freq: Once | INTRAMUSCULAR | Status: AC
  Administered 2024-01-05: 50 ug via INTRAVENOUS
  Filled 2024-01-05: qty 1

## 2024-01-05 MED ORDER — INSULIN GLARGINE-YFGN 100 UNIT/ML ~~LOC~~ SOLN
10.0000 [IU] | Freq: Once | SUBCUTANEOUS | Status: AC
  Administered 2024-01-05: 10 [IU] via SUBCUTANEOUS
  Filled 2024-01-05: qty 0.1

## 2024-01-05 MED ORDER — INSULIN REGULAR(HUMAN) IN NACL 100-0.9 UT/100ML-% IV SOLN
INTRAVENOUS | Status: DC
Start: 2024-01-05 — End: 2024-01-06
  Administered 2024-01-05: 11.5 [IU]/h via INTRAVENOUS
  Filled 2024-01-05: qty 100

## 2024-01-05 MED ORDER — QUETIAPINE FUMARATE 50 MG PO TABS
50.0000 mg | ORAL_TABLET | Freq: Every day | ORAL | Status: DC
  Administered 2024-01-05: 50 mg via ORAL
  Filled 2024-01-05: qty 1

## 2024-01-05 MED ORDER — ENOXAPARIN SODIUM 40 MG/0.4ML IJ SOSY: 40.0000 mg | PREFILLED_SYRINGE | INTRAMUSCULAR | Status: DC

## 2024-01-05 MED ORDER — ACETAMINOPHEN 325 MG PO TABS
650.0000 mg | ORAL_TABLET | Freq: Four times a day (QID) | ORAL | Status: DC | PRN
Start: 2024-01-05 — End: 2024-01-06

## 2024-01-05 MED ORDER — DEXTROSE 50 % IV SOLN: 0.0000 mL | INTRAVENOUS | Status: DC | PRN

## 2024-01-05 MED ORDER — LACTATED RINGERS IV BOLUS
20.0000 mL/kg | Freq: Once | INTRAVENOUS | Status: AC
  Administered 2024-01-05: 1134 mL via INTRAVENOUS

## 2024-01-05 MED ORDER — ONDANSETRON HCL 4 MG/2ML IJ SOLN
4.0000 mg | Freq: Four times a day (QID) | INTRAMUSCULAR | Status: DC | PRN
  Administered 2024-01-05: 4 mg via INTRAVENOUS
  Filled 2024-01-05: qty 2

## 2024-01-05 MED ORDER — ROSUVASTATIN CALCIUM 10 MG PO TABS
10.0000 mg | ORAL_TABLET | Freq: Every day | ORAL | Status: DC
Start: 2024-01-05 — End: 2024-01-06
  Administered 2024-01-05 – 2024-01-06 (×2): 10 mg via ORAL
  Filled 2024-01-05 (×2): qty 1

## 2024-01-05 MED ORDER — LACTATED RINGERS IV BOLUS
1000.0000 mL | Freq: Once | INTRAVENOUS | Status: AC
  Administered 2024-01-05: 1000 mL via INTRAVENOUS

## 2024-01-05 MED ORDER — SUCRALFATE 1 G PO TABS
1.0000 g | ORAL_TABLET | Freq: Two times a day (BID) | ORAL | Status: DC
  Administered 2024-01-05 – 2024-01-06 (×3): 1 g via ORAL
  Filled 2024-01-05 (×3): qty 1

## 2024-01-05 MED ORDER — INSULIN GLARGINE (1 UNIT DIAL) 300 UNIT/ML ~~LOC~~ SOPN: 20.0000 [IU] | PEN_INJECTOR | Freq: Every day | SUBCUTANEOUS | Status: DC

## 2024-01-05 MED ORDER — ONDANSETRON HCL 4 MG/2ML IJ SOLN
4.0000 mg | Freq: Once | INTRAMUSCULAR | Status: AC
  Administered 2024-01-05: 4 mg via INTRAVENOUS
  Filled 2024-01-05: qty 2

## 2024-01-05 MED ORDER — POTASSIUM CHLORIDE 10 MEQ/100ML IV SOLN
10.0000 meq | INTRAVENOUS | Status: AC
  Administered 2024-01-05 (×2): 10 meq via INTRAVENOUS
  Filled 2024-01-05 (×2): qty 100

## 2024-01-05 MED ORDER — PANTOPRAZOLE SODIUM 40 MG IV SOLR
40.0000 mg | Freq: Once | INTRAVENOUS | Status: AC
  Administered 2024-01-05: 40 mg via INTRAVENOUS
  Filled 2024-01-05: qty 10

## 2024-01-05 MED ORDER — ORAL CARE MOUTH RINSE: 15.0000 mL | OROMUCOSAL | Status: DC | PRN

## 2024-01-05 MED ORDER — LACTATED RINGERS IV SOLN
INTRAVENOUS | Status: DC
Start: 2024-01-05 — End: 2024-01-06

## 2024-01-05 MED ORDER — CHLORHEXIDINE GLUCONATE CLOTH 2 % EX PADS
6.0000 | MEDICATED_PAD | Freq: Every day | CUTANEOUS | Status: DC
Start: 2024-01-05 — End: 2024-01-06
  Administered 2024-01-05 – 2024-01-06 (×2): 6 via TOPICAL

## 2024-01-05 MED ORDER — PROCHLORPERAZINE EDISYLATE 10 MG/2ML IJ SOLN
7.5000 mg | Freq: Once | INTRAMUSCULAR | Status: AC
Start: 2024-01-05 — End: 2024-01-05
  Administered 2024-01-05: 7.5 mg via INTRAVENOUS
  Filled 2024-01-05: qty 2

## 2024-01-05 MED ORDER — DEXTROSE IN LACTATED RINGERS 5 % IV SOLN
INTRAVENOUS | Status: DC
Start: 2024-01-05 — End: 2024-01-06
  Filled 2024-01-05: qty 1000

## 2024-01-05 MED ORDER — ACETAMINOPHEN 650 MG RE SUPP: 650.0000 mg | Freq: Four times a day (QID) | RECTAL | Status: DC | PRN

## 2024-01-05 MED ORDER — INSULIN GLARGINE-YFGN 100 UNIT/ML ~~LOC~~ SOLN
20.0000 [IU] | Freq: Every day | SUBCUTANEOUS | Status: AC
  Administered 2024-01-05: 20 [IU] via SUBCUTANEOUS
  Filled 2024-01-05 (×3): qty 0.2

## 2024-01-05 MED ORDER — POTASSIUM CHLORIDE CRYS ER 20 MEQ PO TBCR
40.0000 meq | EXTENDED_RELEASE_TABLET | Freq: Once | ORAL | Status: DC
  Filled 2024-01-05: qty 2

## 2024-01-05 MED ORDER — INSULIN ASPART 100 UNIT/ML IJ SOLN
0.0000 [IU] | Freq: Three times a day (TID) | INTRAMUSCULAR | Status: DC
  Administered 2024-01-05: 1 [IU] via SUBCUTANEOUS
  Administered 2024-01-06: 2 [IU] via SUBCUTANEOUS
  Filled 2024-01-05: qty 0.09

## 2024-01-05 MED ORDER — LEVOTHYROXINE SODIUM 25 MCG PO TABS
137.0000 ug | ORAL_TABLET | Freq: Every day | ORAL | Status: DC
  Administered 2024-01-06: 137 ug via ORAL
  Filled 2024-01-05: qty 1

## 2024-01-05 MED ORDER — FENTANYL CITRATE PF 50 MCG/ML IJ SOSY
25.0000 ug | PREFILLED_SYRINGE | INTRAMUSCULAR | Status: AC | PRN
  Administered 2024-01-05 – 2024-01-06 (×3): 25 ug via INTRAVENOUS
  Filled 2024-01-05 (×3): qty 1

## 2024-01-05 MED ORDER — INSULIN GLARGINE-YFGN 100 UNIT/ML ~~LOC~~ SOLN
25.0000 [IU] | Freq: Every day | SUBCUTANEOUS | Status: DC
  Filled 2024-01-05: qty 0.25

## 2024-01-05 MED ORDER — ASPIRIN 81 MG PO TBEC
81.0000 mg | DELAYED_RELEASE_TABLET | Freq: Every day | ORAL | Status: DC
  Administered 2024-01-05 – 2024-01-06 (×2): 81 mg via ORAL
  Filled 2024-01-05 (×2): qty 1

## 2024-01-05 MED ORDER — METOCLOPRAMIDE HCL 5 MG PO TABS
5.0000 mg | ORAL_TABLET | Freq: Three times a day (TID) | ORAL | Status: DC
  Administered 2024-01-05 – 2024-01-06 (×3): 5 mg via ORAL
  Filled 2024-01-05 (×3): qty 1

## 2024-01-05 NOTE — Progress Notes (Signed)
[facility] admitting physician addendum:  We are transitioning the patient back to SQ insulin.  I will decrease tonight's dose and give 10 units SQ now with RI SS coverage and 20 units tonight, then the patient can resume regular 25 units tomorrow evening.  Sanda Klein, MD.

## 2024-01-05 NOTE — ED Provider Notes (Signed)
[place] EMERGENCY DEPARTMENT AT [facility] Provider Note  CSN: [id number] Arrival date & time: 01/05/24 0541  Chief Complaint(s) Hyperglycemia  HPI [name] [name] is a 63 y.o. female with PMH insulin-dependent diabetes, pancreatitis, IBS, HTN, HLD, gastroparesis, peptic ulcer disease who presents emergency department for evaluation of hyperglycemia, abdominal pain and urinary retention.  States that she was diagnosed with the flu earlier this week and has had persistent nausea and vomiting.  States that she has been unable to pee since midnight and has had severe 10 out of 10 abdominal pain with urgency.  Denies chest pain, shortness of breath, headache, fever or other systemic symptoms.   Past Medical History Past Medical History:  Diagnosis Date   Allergy    seasonal   Anxiety    B12 deficiency    Chest pain 04/05/2017   CHF (congestive heart failure) ([name])    Diabetes mellitus type 1, uncontrolled    "dr. 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) 10/29/2013   Leg cramping    "@ night" (09/18/2013)   Migraine    "once q couple months" (11/26/2018)   Osteoporosis    Peripheral neuropathy    Umbilical hernia    unrepaired (09/18/2013)   Patient Active Problem List   Diagnosis Date Noted   Diabetic ketoacidosis associated with type 1 diabetes mellitus ([name]) 12/07/2023   Depression 11/12/2023   Intractable abdominal pain 10/26/2023   Gall bladder polyp 10/25/2023   Peptic ulcer disease 10/25/2023   Bile duct abnormality 09/24/2023   Gastritis and gastroduodenitis 09/24/2023   Multiple duodenal ulcers 09/24/2023   Esophagitis 09/23/2023   Abdominal pain, bilateral upper quadrant 09/23/2023    Intractable nausea 09/22/2023   Acute pancreatitis 09/12/2023   Hyperglycemia 09/02/2023   DKA (diabetic ketoacidosis) ([name]) 08/01/2023   Intractable vomiting with nausea 06/14/2023   non obstructive CAD (coronary artery disease) 05/14/2023   Urinary retention 05/14/2023   Uncontrolled type 1 diabetes mellitus with hyperglycemia, with long-term current use of insulin ([name]) 05/14/2023   UTI (urinary tract infection) 05/14/2023   Abnormal CT scan, esophagus 02/05/2023   Gastroparesis 01/07/2023   Chronic pain of both knees 08/20/2022   Intractable nausea and vomiting 03/27/2022   Marijuana abuse 03/27/2022   Bladder wall thickening 03/09/2021   Chronic orthostatic hypotension 03/06/2021   Gastric erosion    Urinary frequency 02/10/2020   Chronic diastolic HF (heart failure) ([name]) 10/12/2019   Duodenal ulcer without hemorrhage or perforation    Acute esophagitis    Duodenitis    Constipation    Nausea and vomiting 03/25/2019   MDD (major depressive disorder), recurrent episode, moderate ([name]) 07/01/2018   Dehydration 05/26/2018   Chronic chest pain    Noncompliance 01/14/2018   Migraines 12/11/2017   Phrygian cap 06/20/2017   Leukocytosis    Hypokalemia 02/15/2017   NSTEMI (non-ST elevated myocardial infarction) ([name]) 02/11/2017   AKI (acute kidney injury) ([name]) 07/24/2016   Paroxysmal A-fib ([name]) 02/01/2015   Kidney disease, chronic, stage II (GFR 60-89 ml/min) 10/29/2013   Nausea 10/28/2013   Abdominal pain 08/25/2013   Vitamin D deficiency 05/20/2012   Essential hypertension 02/01/2011   Osteoporosis 02/01/2011   B12 deficiency 04/04/2010   GERD 04/08/2009  Hypothyroidism 04/29/2008   Hyperlipidemia 04/29/2008   Irritable bowel syndrome 04/29/2008   Home Medication(s) Prior to Admission medications   Medication Sig Start Date End Date Taking? Authorizing Provider  aspirin EC 81 MG tablet Take 81 mg by mouth daily. Swallow whole.    [provider]   azelastine (ASTELIN) 0.1 % nasal spray Place 1 spray into both nostrils as needed for rhinitis or allergies. 11/04/23   [provider]  Continuous Blood Gluc Transmit (DEXCOM G6 TRANSMITTER) MISC USE AS DIRECTED FOR CONTINUOUS GLUCOSE MONITORING. REUSE TRANSMITTER X 90DAYS THEN DISCARD & REPLACE 07/05/21   Bedsole, Amy E, MD  escitalopram (LEXAPRO) 10 MG tablet Take 1 tablet (10 mg total) by mouth daily. 11/18/23   Rodolph Bong, MD  Glucagon, rDNA, (GLUCAGON EMERGENCY) 1 MG KIT Inject 1 mg into the skin See admin instructions. Inject one mg into the skin see administration instructions. Follow package directions for low blood sugar. 06/18/23   [provider]  levothyroxine (SYNTHROID) 137 MCG tablet Take 137 mcg by mouth daily before breakfast. 03/19/22   [provider]  metoCLOPramide (REGLAN) 5 MG tablet Take 1 tablet (5 mg total) by mouth 3 (three) times daily before meals. 11/12/23   Danford, Earl Lites, MD  NOVOLOG FLEXPEN 100 UNIT/ML FlexPen Inject 10 Units into the skin 3 (three) times daily with meals. Plus sliding scale TID 12/08/23   Azucena Fallen, MD  omega-3 acid ethyl esters (LOVAZA) 1 g capsule Take 2 capsules (2 g total) by mouth 2 (two) times daily. 09/13/23   Alwyn Ren, MD  ondansetron (ZOFRAN) 4 MG tablet Take 2 tablets (8 mg total) by mouth every 8 (eight) hours as needed for nausea or vomiting. 12/25/23   Mansouraty, Netty Starring., MD  ondansetron (ZOFRAN-ODT) 4 MG disintegrating tablet Take 1 tablet (4 mg total) by mouth every 4 (four) hours as needed. 12/12/23   Arby Barrette, MD  pantoprazole (PROTONIX) 40 MG tablet Take 1 tablet (40 mg total) by mouth 2 (two) times daily. 12/25/23   Mansouraty, Netty Starring., MD  QUEtiapine (SEROQUEL) 50 MG tablet Take 50 mg by mouth at bedtime. 09/09/23   [provider]  rosuvastatin (CRESTOR) 10 MG tablet Take 1 tablet (10 mg total) by mouth daily. 11/18/23   Rodolph Bong, MD   sucralfate (CARAFATE) 1 g tablet Take 1 tablet (1 g total) by mouth 2 (two) times daily. Do not take any other medication within 2-4 hours. 12/25/23   Mansouraty, Netty Starring., MD  tamsulosin (FLOMAX) 0.4 MG CAPS capsule Take 1 capsule (0.4 mg total) by mouth daily after supper. 09/04/23   Arnetha Courser, MD  TOUJEO SOLOSTAR 300 UNIT/ML Solostar Pen Inject 25 Units into the skin at bedtime. 12/08/23   Azucena Fallen, MD  traMADol Janean Sark) 50 MG tablet 1 to 2 tablets every 6 hours with extra strength Tylenol for pain as needed 12/12/23   Arby Barrette, MD  traZODone (DESYREL) 50 MG tablet Take 50 mg by mouth at bedtime.    [provider]  Past Surgical History Past Surgical History:  Procedure Laterality Date   BIOPSY  06/03/2019   Procedure: BIOPSY;  Surgeon: Lynann Bologna, MD;  Location: [facility];  Service: Endoscopy;;   BIOPSY  08/17/2020   Procedure: BIOPSY;  Surgeon: Tressia Danas, MD;  Location: [facility];  Service: Gastroenterology;;   BIOPSY  02/05/2023   Procedure: BIOPSY;  Surgeon: Meryl Dare, MD;  Location: [facility];  Service: Gastroenterology;;   BIOPSY  09/24/2023   Procedure: BIOPSY;  Surgeon: Lemar Lofty., MD;  Location: [facility];  Service: Gastroenterology;;   CESAREAN SECTION  1989   ENTEROSCOPY N/A 06/03/2019   Procedure: ENTEROSCOPY;  Surgeon: Lynann Bologna, MD;  Location: [facility];  Service: Endoscopy;  Laterality: N/A;   ESOPHAGOGASTRODUODENOSCOPY (EGD) WITH PROPOFOL N/A 08/17/2020   Procedure: ESOPHAGOGASTRODUODENOSCOPY (EGD) WITH PROPOFOL;  Surgeon: Tressia Danas, MD;  Location: [facility];  Service: Gastroenterology;  Laterality: N/A;   ESOPHAGOGASTRODUODENOSCOPY (EGD) WITH PROPOFOL N/A 02/05/2023   Procedure: ESOPHAGOGASTRODUODENOSCOPY (EGD) WITH PROPOFOL;  Surgeon: Meryl Dare, MD;   Location: [facility];  Service: Gastroenterology;  Laterality: N/A;   ESOPHAGOGASTRODUODENOSCOPY (EGD) WITH PROPOFOL N/A 09/24/2023   Procedure: ESOPHAGOGASTRODUODENOSCOPY (EGD) WITH PROPOFOL;  Surgeon: Meridee Score Netty Starring., MD;  Location: [facility];  Service: Gastroenterology;  Laterality: N/A;   EUS N/A 09/24/2023   Procedure: UPPER ENDOSCOPIC ULTRASOUND (EUS) LINEAR;  Surgeon: Lemar Lofty., MD;  Location: [facility];  Service: Gastroenterology;  Laterality: N/A;   LAPAROSCOPIC ASSISTED VAGINAL HYSTERECTOMY  09/23/2012   Procedure: LAPAROSCOPIC ASSISTED VAGINAL HYSTERECTOMY;  Surgeon: Mitchel Honour, DO;  Location: [facility];  Service: Gynecology;  Laterality: N/A;  pull Dr Vance Gather instrument   LEFT HEART CATH AND CORONARY ANGIOGRAPHY N/A 02/11/2017   Procedure: Left Heart Cath and Coronary Angiography;  Surgeon: Runell Gess, MD;  Location: [facility] INVASIVE CV LAB;  Service: Cardiovascular;  Laterality: N/A;   TUBAL LIGATION Bilateral 1992   Family History Family History  Problem Relation Age of Onset   Heart failure Mother 24       Her heart skipped a beat and stopped - per pt   Heart failure Brother    Diabetes Brother    Diabetes Maternal Grandmother    Alzheimer's disease Maternal Grandmother    Coronary artery disease Maternal Grandmother    Heart attack Maternal Grandfather    Diabetes Other    Heart disease Other    Colon polyps Neg Hx    Esophageal cancer Neg Hx    Liver cancer Neg Hx    Pancreatic cancer Neg Hx    Stomach cancer Neg Hx    Crohn's disease Neg Hx    Rectal cancer Neg Hx    Colon cancer Neg Hx     Social History Social History   Tobacco Use   Smoking status: Former    Current packs/day: 0.00    Average packs/day: 0.1 packs/day for 10.0 years (1.2 ttl pk-yrs)    Types: Cigarettes    Start date: 12/10/1980    Quit date: 12/10/1990    Years since quitting: 33.0   Smokeless tobacco: Never  Vaping Use   Vaping status: Never Used  Substance  Use Topics   Alcohol use: Never    Alcohol/week: 0.0 standard drinks of alcohol   Drug use: Not Currently    Types: Marijuana    Comment: last use 2 days ago   Allergies Codeine  Review of Systems Review of Systems  Gastrointestinal:  Positive for abdominal pain, nausea and vomiting.  Physical Exam Vital Signs  I have reviewed the triage vital signs LMP 08/11/2012   Physical Exam Vitals and nursing note reviewed.  Constitutional:      General: She is not in acute distress.    Appearance: She is well-developed. She is ill-appearing.  HENT:     Head: Normocephalic and atraumatic.  Eyes:     Conjunctiva/sclera: Conjunctivae normal.  Cardiovascular:     Rate and Rhythm: Normal rate and regular rhythm.     Heart sounds: No murmur heard. Pulmonary:     Effort: Pulmonary effort is normal. No respiratory distress.     Breath sounds: Normal breath sounds.  Abdominal:     Palpations: Abdomen is soft.     Tenderness: There is abdominal tenderness.  Musculoskeletal:        General: No swelling.     Cervical back: Neck supple.  Skin:    General: Skin is warm and dry.     Capillary Refill: Capillary refill takes less than 2 seconds.  Neurological:     Mental Status: She is alert.  Psychiatric:        Mood and Affect: Mood normal.     ED Results and Treatments Labs (all labs ordered are listed, but only abnormal results are displayed) Labs Reviewed  CBG MONITORING, ED - Abnormal; Notable for the following components:      Result Value   Glucose-Capillary 579 (*)    All other components within normal limits  COMPREHENSIVE METABOLIC PANEL  CBC WITH DIFFERENTIAL/PLATELET  BETA-HYDROXYBUTYRIC ACID  BLOOD GAS, VENOUS  LIPASE, BLOOD  URINALYSIS, ROUTINE W REFLEX MICROSCOPIC                                                                                                                          Radiology No results found.  Pertinent labs & imaging results that were  available during my care of the patient were reviewed by me and considered in my medical decision making (see MDM for details).  Medications Ordered in ED Medications  fentaNYL (SUBLIMAZE) injection 50 mcg (has no administration in time range)                                                                                                                                     Procedures .Critical Care  Performed by: Glendora Score, MD Authorized by: Glendora Score, MD   Critical  care provider statement:    Critical care time (minutes):  30   Critical care was necessary to treat or prevent imminent or life-threatening deterioration of the following conditions:  Endocrine crisis   Critical care was time spent personally by me on the following activities:  Development of treatment plan with patient or surrogate, discussions with consultants, evaluation of patient's response to treatment, examination of patient, ordering and review of laboratory studies, ordering and review of radiographic studies, ordering and performing treatments and interventions, pulse oximetry, re-evaluation of patient's condition and review of old charts   (including critical care time)  Medical Decision Making / ED Course   This patient presents to the ED for concern of hyperglycemia, abdominal pain, urinary retention, this involves an extensive number of treatment options, and is a complaint that carries with it a high risk of complications and morbidity.  The differential diagnosis includes DKA, HHS, stress hyperglycemia, influenza, urinary obstruction, UTI  MDM: Patient seen emergency room for evaluation of multiple complaints described above.  Physical exam with significant tenderness in the suprapubic region but is otherwise unremarkable.  Bedside ultrasound showing greater than 1 L of retained urine.  Foley catheter placed with successful evacuation of the bladder and improvement of patient's pain.  Initial glucose  542.  Fluid resuscitation begun as well as pain and nausea control.  Additional laboratory evaluation pending at time of signout.  Please see provider signout note for attenuation of workup.   Additional history obtained:  -External records from outside source obtained and reviewed including: Chart review including previous notes, labs, imaging, consultation notes   Lab Tests: -I ordered, reviewed, and interpreted labs.   The pertinent results include:   Labs Reviewed  CBG MONITORING, ED - Abnormal; Notable for the following components:      Result Value   Glucose-Capillary 579 (*)    All other components within normal limits  COMPREHENSIVE METABOLIC PANEL  CBC WITH DIFFERENTIAL/PLATELET  BETA-HYDROXYBUTYRIC ACID  BLOOD GAS, VENOUS  LIPASE, BLOOD  URINALYSIS, ROUTINE W REFLEX MICROSCOPIC       Medicines ordered and prescription drug management: Meds ordered this encounter  Medications   fentaNYL (SUBLIMAZE) injection 50 mcg    -I have reviewed the patients home medicines and have made adjustments as needed  Critical interventions Fluid resuscitation    Cardiac Monitoring: The patient was maintained on a cardiac monitor.  I personally viewed and interpreted the cardiac monitored which showed an underlying rhythm of: NSR  Social Determinants of Health:  Factors impacting patients care include: none   Reevaluation: After the interventions noted above, I reevaluated the patient and found that they have :improved  Co morbidities that complicate the patient evaluation  Past Medical History:  Diagnosis Date   Allergy    seasonal   Anxiety    B12 deficiency    Chest pain 04/05/2017   CHF (congestive heart failure) ([name])    Diabetes mellitus type 1, uncontrolled    "dr. 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  Please see provider sentiment for continuation of workup.     Final Clinical Impression(s) / ED Diagnoses Final diagnoses:  None     @PCDICTATION @    Glendora Score, MD 01/05/24 2219

## 2024-01-05 NOTE — ED Provider Notes (Signed)
Patient mated to hospitalist for concern for compensated DKA.  Blood sugars 542 anion gap is 22.  Will put her on insulin drip and admitted to medicine service for further care.   [name], DO 01/05/24 [phone]

## 2024-01-05 NOTE — H&P (Signed)
History and Physical    Patient: [name] [name] NWG:[id number] DOB: [birth year] DOA: 01/05/2024 DOS: the patient was seen and examined on 01/05/2024 PCP: Everardo All, NP  Patient coming from: Home  Chief Complaint:  Chief Complaint  Patient presents with   Hyperglycemia   HPI: MAZELLA DEEN is a 63 y.o. female with medical history significant of seasonal allergies, anxiety, chest pain, GERD, hyperlipidemia, hypertension, hypothyroidism, chronic diastolic heart failure, IBS, intermittent vertigo, migraine headaches, osteoporosis, umbilical hernia, peripheral neuropathy, type 2 diabetes, history of DKA who presented to the emergency department with complaints of hyperglycemia associated with abdominal pain, nausea and 10 episodes of emesis since yesterday evening.  She has had some polyuria and polydipsia.  She has not missed any insulin doses.  She has been compliant with her diet.  She denied fever, chills, rhinorrhea, sore throat, wheezing or hemoptysis.  No chest pain, palpitations, diaphoresis, PND, orthopnea or pitting edema of the lower extremities.  No abdominal pain, nausea, emesis, diarrhea, constipation, melena or hematochezia.  No flank pain, dysuria, frequency or hematuria.   Lab work: Shows a urinalysis with greater than 500 glucose, ketones of 20 and protein of 100 mg/dL.  Positive nitrites, otherwise unremarkable.  CBC is her white count 7.8, hemoglobin 15.6 g/dL platelets 578.  Venous blood gas showed pH of 7.49, pCO2 of 46 and pO2 of 41 mmHg.  Bicarbonate was 35.1 and acid-base deficit is 10.2 mmol/L.  Beta-hydroxybutyrate acid 3.77 mmol/L.  Lipase was normal.  CMP showed an AST of 12 units/L, glucose of 542 and BUN of 26 mg/dL, the rest of the CMP measurements are normal after sodium correction.  ED course: Initial vital signs were temperature 98.1 F, pulse 99, respiration 15, BP 145/106 mmHg O2 sat 97% on room air.  The patient was started on insulin infusion and IV fluids.    Review of Systems: As mentioned in the history of present illness. All other systems reviewed and are negative. Past Medical History:  Diagnosis Date   Allergy    seasonal   Anxiety    B12 deficiency    Chest pain 04/05/2017   CHF (congestive heart failure) ([name])    Diabetes mellitus type 1, uncontrolled    "dr. 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) 10/29/2013   Leg cramping    "@ night" (09/18/2013)   Migraine    "once q couple months" (11/26/2018)   Osteoporosis    Peripheral neuropathy    Umbilical hernia    unrepaired (09/18/2013)   Past Surgical History:  Procedure Laterality Date   BIOPSY  06/03/2019   Procedure: BIOPSY;  Surgeon: Lynann Bologna, MD;  Location: [facility];  Service: Endoscopy;;   BIOPSY  08/17/2020   Procedure: BIOPSY;  Surgeon: Tressia Danas, MD;  Location: [facility];  Service: Gastroenterology;;   BIOPSY  02/05/2023   Procedure: BIOPSY;  Surgeon: Meryl Dare, MD;  Location: [facility];  Service: Gastroenterology;;   BIOPSY  09/24/2023   Procedure: BIOPSY;  Surgeon: Lemar Lofty., MD;  Location: [facility];  Service: Gastroenterology;;   CESAREAN SECTION  1989   ENTEROSCOPY N/A 06/03/2019   Procedure: ENTEROSCOPY;  Surgeon: Lynann Bologna, MD;  Location: [facility];  Service:  Endoscopy;  Laterality: N/A;   ESOPHAGOGASTRODUODENOSCOPY (EGD) WITH PROPOFOL N/A 08/17/2020   Procedure: ESOPHAGOGASTRODUODENOSCOPY (EGD) WITH PROPOFOL;  Surgeon: Tressia Danas, MD;  Location: [facility];  Service: Gastroenterology;  Laterality: N/A;   ESOPHAGOGASTRODUODENOSCOPY (EGD) WITH PROPOFOL N/A 02/05/2023   Procedure: ESOPHAGOGASTRODUODENOSCOPY (EGD) WITH PROPOFOL;  Surgeon: Meryl Dare, MD;  Location: [facility];  Service: Gastroenterology;  Laterality: N/A;   ESOPHAGOGASTRODUODENOSCOPY (EGD) WITH PROPOFOL N/A 09/24/2023   Procedure: ESOPHAGOGASTRODUODENOSCOPY (EGD) WITH PROPOFOL;  Surgeon: Meridee Score Netty Starring., MD;  Location: [facility];  Service: Gastroenterology;  Laterality: N/A;   EUS N/A 09/24/2023   Procedure: UPPER ENDOSCOPIC ULTRASOUND (EUS) LINEAR;  Surgeon: Lemar Lofty., MD;  Location: [facility];  Service: Gastroenterology;  Laterality: N/A;   LAPAROSCOPIC ASSISTED VAGINAL HYSTERECTOMY  09/23/2012   Procedure: LAPAROSCOPIC ASSISTED VAGINAL HYSTERECTOMY;  Surgeon: Mitchel Honour, DO;  Location: [facility];  Service: Gynecology;  Laterality: N/A;  pull Dr Vance Gather instrument   LEFT HEART CATH AND CORONARY ANGIOGRAPHY N/A 02/11/2017   Procedure: Left Heart Cath and Coronary Angiography;  Surgeon: Runell Gess, MD;  Location: [facility] IN[facility]ASIVE CV LAB;  Service: Cardiovascular;  Laterality: N/A;   TUBAL LIGATION Bilateral 1992   Social History:  reports that she quit smoking about 33 years ago. Her smoking use included cigarettes. She started smoking about 43 years ago. She has a 1.2 pack-year smoking history. She has never used smokeless tobacco. She reports that she does not currently use drugs after having used the following drugs: Marijuana. She reports that she does not drink alcohol.  Allergies  Allergen Reactions   Codeine Hives, Anxiety and Other (See Comments)    Family History  Problem Relation Age of Onset   Heart failure Mother 54       Her heart skipped a beat and stopped - per pt   Heart failure Brother    Diabetes Brother    Diabetes Maternal Grandmother    Alzheimer's disease Maternal Grandmother    Coronary artery disease Maternal Grandmother    Heart attack Maternal Grandfather    Diabetes Other    Heart disease Other    Colon polyps Neg Hx    Esophageal cancer Neg Hx    Liver cancer Neg Hx    Pancreatic cancer Neg Hx     Stomach cancer Neg Hx    Crohn's disease Neg Hx    Rectal cancer Neg Hx    Colon cancer Neg Hx     Prior to Admission medications   Medication Sig Start Date End Date Taking? Authorizing Provider  aspirin EC 81 MG tablet Take 81 mg by mouth daily. Swallow whole.   Yes [provider]  escitalopram (LEXAPRO) 10 MG tablet Take 1 tablet (10 mg total) by mouth daily. 11/18/23  Yes Rodolph Bong, MD  ipratropium (ATROVENT) 0.03 % nasal spray Place into both nostrils. 01/03/24  Yes [provider]  omega-3 acid ethyl esters (LOVAZA) 1 g capsule Take 2 capsules (2 g total) by mouth 2 (two) times daily. 09/13/23  Yes Alwyn Ren, MD  ondansetron (ZOFRAN) 4 MG tablet Take 2 tablets (8 mg total) by mouth every 8 (eight) hours as needed for nausea or vomiting. 12/25/23  Yes Mansouraty, Netty Starring., MD  oseltamivir (TAMIFLU) 75 MG capsule Take 75 mg by mouth 2 (two) times daily. 01/03/24  Yes [provider]  rosuvastatin (CRESTOR) 10 MG tablet Take 1 tablet (10 mg total) by mouth daily. 11/18/23  Yes Rodolph Bong, MD  TOUJEO SOLOSTAR 300 UNIT/ML Solostar Pen Inject 25 Units into the skin at bedtime. 12/08/23  Yes Azucena Fallen, MD  traMADol Janean Sark) 50 MG tablet 1 to 2 tablets every 6 hours with extra strength Tylenol for pain as needed 12/12/23  Yes Pfeiffer, Lebron Conners, MD  azelastine (ASTELIN) 0.1 % nasal spray Place 1 spray into both nostrils as needed for rhinitis or allergies. 11/04/23   [provider]  Continuous Blood Gluc Transmit (DEXCOM G6 TRANSMITTER) MISC USE AS DIRECTED FOR CONTINUOUS GLUCOSE MONITORING. REUSE TRANSMITTER X 90DAYS THEN DISCARD & REPLACE 07/05/21   Bedsole, Amy E, MD  Glucagon, rDNA, (GLUCAGON EMERGENCY) 1 MG KIT Inject 1 mg into the skin See admin instructions. Inject one mg into the skin see administration instructions. Follow package directions for low blood sugar. 06/18/23   [provider]  levothyroxine (SYNTHROID)  137 MCG tablet Take 137 mcg by mouth daily before breakfast. 03/19/22   [provider]  metoCLOPramide (REGLAN) 5 MG tablet Take 1 tablet (5 mg total) by mouth 3 (three) times daily before meals. 11/12/23   Danford, Earl Lites, MD  NOVOLOG FLEXPEN 100 UNIT/ML FlexPen Inject 10 Units into the skin 3 (three) times daily with meals. Plus sliding scale TID 12/08/23   Azucena Fallen, MD  ondansetron (ZOFRAN-ODT) 4 MG disintegrating tablet Take 1 tablet (4 mg total) by mouth every 4 (four) hours as needed. 12/12/23   Arby Barrette, MD  pantoprazole (PROTONIX) 40 MG tablet Take 1 tablet (40 mg total) by mouth 2 (two) times daily. 12/25/23   Mansouraty, Netty Starring., MD  QUEtiapine (SEROQUEL) 50 MG tablet Take 50 mg by mouth at bedtime. 09/09/23   [provider]  sucralfate (CARAFATE) 1 g tablet Take 1 tablet (1 g total) by mouth 2 (two) times daily. Do not take any other medication within 2-4 hours. 12/25/23   Mansouraty, Netty Starring., MD  tamsulosin (FLOMAX) 0.4 MG CAPS capsule Take 1 capsule (0.4 mg total) by mouth daily after supper. 09/04/23   Arnetha Courser, MD  traZODone (DESYREL) 50 MG tablet Take 50 mg by mouth at bedtime.    [provider]    Physical Exam: Vitals:   01/05/24 0648  BP: (!) 145/106  Pulse: 99  Resp: 15  Temp: 98.1 F (36.7 C)  TempSrc: Oral  SpO2: 97%   Physical Exam Vitals and nursing note reviewed.  Constitutional:      General: She is awake. She is not in acute distress.    Appearance: Normal appearance. She is ill-appearing.  HENT:     Head: Normocephalic.     Nose: No rhinorrhea.     Mouth/Throat:     Mouth: Mucous membranes are dry.  Eyes:     General: No scleral icterus.    Pupils: Pupils are equal, round, and reactive to light.  Neck:     Vascular: No JVD.  Cardiovascular:     Rate and Rhythm: Normal rate and regular rhythm.     Heart sounds: S1 normal and S2 normal.  Pulmonary:     Effort: Pulmonary effort is normal. No  tachypnea or respiratory distress.     Breath sounds: Normal breath sounds. No wheezing, rhonchi or rales.  Abdominal:     General: Bowel sounds are normal. There is no distension.     Palpations: Abdomen is soft.     Tenderness: There is abdominal tenderness in the epigastric area. There is no right CVA tenderness, left CVA tenderness or guarding.  Musculoskeletal:  Cervical back: Neck supple.     Right lower leg: No edema.     Left lower leg: No edema.  Skin:    General: Skin is warm and dry.  Neurological:     General: No focal deficit present.     Mental Status: She is alert and oriented to person, place, and time.  Psychiatric:        Mood and Affect: Mood normal.        Behavior: Behavior normal. Behavior is cooperative.     Data Reviewed:  Results are pending, will review when available.  Assessment and Plan: Principal Problem:   Uncontrolled type 1 diabetes mellitus with hyperglycemia,    with long-term current use of insulin ([name]) Presenting with:   Diabetic ketoacidosis associated with type 1 diabetes mellitus ([name]) Compensated by by multiple episodes of emesis leading to:   Metabolic alkalosis Observation/stepdown. Keep NPO. Continue IV fluids. Continue insulin infusion. Monitor CBG closely. BMP every 4 hours. BHA every 8 hours. Replace electrolytes as needed. Consult diabetes coordinator. Transition to SQ insulin per Endo tool.  Active Problems:   Intractable nausea and vomiting With history of:   Gastroparesis   Gastritis and gastroduodenitis   Peptic ulcer disease   GERD Pantoprazole 40 mg IVP x 1 now. Continue pantoprazole 40 mg p.o. twice daily. Antiemetics as needed. Analgesics as needed. Resume Carafate/metoclopramide AC once cleared for oral intake.    Polycythemia Secondary to volume depletion. Continue IV fluids. Follow-up H&H in AM.    Essential hypertension Currently not on antihypertensives. Monitor blood pressure. As needed  antihypertensives. Follow-up with PCP.    non obstructive CAD (coronary artery disease) Continue EC ASA 81 mg p.o. daily. Continue rosuvastatin 10 mg p.o. daily.    Chronic diastolic HF (heart failure) ([name]) No signs of decompensation. Actually volume depleted.    Hyperlipidemia Continue rosuvastatin as above.    Paroxysmal A-fib ([name]) CHA?DS?-VASc Score of at least 5. (All risk factors, but age and history of CVA). No anticoagulation due to GI bleed risk. Currently not on rate control meds.    Depression Continue escitalopram 10 mg p.o. daily. Continue Seroquel 50 mg p.o. bedtime.    Hypothyroidism Has history of noncompliance. Multiple abnormal TSH levels. Will check TSH today. Continue levothyroxine 137 mcg p.o. daily.    Advance Care Planning:   Code Status: Full Code   Consults:   Family Communication:   Severity of Illness: The appropriate patient status for this patient is OBSERVATION. Observation status is judged to be reasonable and necessary in order to provide the required intensity of service to ensure the patient's safety. The patient's presenting symptoms, physical exam findings, and initial radiographic and laboratory data in the context of their medical condition is felt to place them at decreased risk for further clinical deterioration. Furthermore, it is anticipated that the patient will be medically stable for discharge from the hospital within 2 midnights of admission.   Author: Bobette Mo, MD 01/05/2024 8:12 AM  For on call review [email or web address].   This document was prepared using Dragon voice recognition software and may contain some unintended transcription errors.

## 2024-01-05 NOTE — ED Triage Notes (Signed)
PT BIBA from home for hyperglycemia and suspected DKA, CBG 542.  Per EMS pt was diagnosed with flu earlier this week and has experienced ongoing nausea and vomiting.  Pt is also experiencing anuria, hasn't urinated since midnight.  Endorses left lower quadrant abdominal pain 10/10

## 2024-01-06 ENCOUNTER — Other Ambulatory Visit: Payer: Self-pay

## 2024-01-06 DIAGNOSIS — I2510 Atherosclerotic heart disease of native coronary artery without angina pectoris: Secondary | ICD-10-CM

## 2024-01-06 DIAGNOSIS — E039 Hypothyroidism, unspecified: Secondary | ICD-10-CM

## 2024-01-06 DIAGNOSIS — K2970 Gastritis, unspecified, without bleeding: Secondary | ICD-10-CM | POA: Diagnosis not present

## 2024-01-06 DIAGNOSIS — E1065 Type 1 diabetes mellitus with hyperglycemia: Secondary | ICD-10-CM

## 2024-01-06 DIAGNOSIS — I10 Essential (primary) hypertension: Secondary | ICD-10-CM | POA: Diagnosis not present

## 2024-01-06 DIAGNOSIS — K2990 Gastroduodenitis, unspecified, without bleeding: Principal | ICD-10-CM

## 2024-01-06 DIAGNOSIS — I2583 Coronary atherosclerosis due to lipid rich plaque: Secondary | ICD-10-CM

## 2024-01-06 LAB — GLUCOSE, CAPILLARY
Glucose-Capillary: 172 mg/dL — ABNORMAL HIGH (ref 70–99)
Glucose-Capillary: 172 mg/dL — ABNORMAL HIGH (ref 70–99)

## 2024-01-06 LAB — CBC
HCT: 38.3 % (ref 36.0–46.0)
Hemoglobin: 12.6 g/dL (ref 12.0–15.0)
MCH: 28.4 pg (ref 26.0–34.0)
MCHC: 32.9 g/dL (ref 30.0–36.0)
MCV: 86.5 fL (ref 80.0–100.0)
Platelets: 305 10*3/uL (ref 150–400)
RBC: 4.43 MIL/uL (ref 3.87–5.11)
RDW: 15.2 % (ref 11.5–15.5)
WBC: 8.8 10*3/uL (ref 4.0–10.5)
nRBC: 0 % (ref 0.0–0.2)

## 2024-01-06 LAB — PROTIME-INR
INR: 1 (ref 0.8–1.2)
Prothrombin Time: 13.1 s (ref 11.4–15.2)

## 2024-01-06 MED ORDER — TAMSULOSIN HCL 0.4 MG PO CAPS: 0.4000 mg | ORAL_CAPSULE | Freq: Every day | ORAL | 6 refills | Status: DC

## 2024-01-06 MED ORDER — TAMSULOSIN HCL 0.4 MG PO CAPS
0.4000 mg | ORAL_CAPSULE | Freq: Every day | ORAL | Status: DC
  Administered 2024-01-06: 0.4 mg via ORAL
  Filled 2024-01-06: qty 1

## 2024-01-06 NOTE — TOC CM/SW Note (Signed)
Transition of Care [facility]) - Inpatient Brief Assessment   Patient Details  Name: [name] [name] MRN: [id number] Date of Birth: [birth year]  Transition [facility] Care [facility]) CM/SW Contact:    Darleene Cleaver, LCSW Phone Number: 01/06/2024, 10:22 AM   Clinical Narrative:  Patient has insurance and a PCP.  Patient does not have any SDOH needs, no anticipated TOC needs at this time.   Transition of Care Asessment: Insurance and Status: Insurance coverage has been reviewed Patient has primary care physician: Yes Home environment has been reviewed: Yes patient lives with husband. Prior level of function:: Indep Prior/Current Home Services: No current home services Social Drivers of Health Review: SDOH reviewed no interventions necessary Readmission risk has been reviewed: Yes Transition of care needs: no transition of care needs at this time

## 2024-01-06 NOTE — Plan of Care (Signed)
  Problem: Coping: Goal: Ability to adjust to condition or change in health will improve Outcome: Adequate for Discharge   Problem: Health Behavior/Discharge Planning: Goal: Ability to identify and utilize available resources and services will improve Outcome: Adequate for Discharge   Problem: Metabolic: Goal: Ability to maintain appropriate glucose levels will improve Outcome: Adequate for Discharge   Problem: Skin Integrity: Goal: Risk for impaired skin integrity will decrease Outcome: Adequate for Discharge

## 2024-01-06 NOTE — Discharge Summary (Signed)
Physician Discharge Summary   Patient: [name] [name] MRN: [id number] DOB: 1961-09-04  Admit date:     01/05/2024  Discharge date: 01/06/24  Discharge Physician: Alberteen Sam   PCP: Everardo All, NP     Recommendations at discharge:  Follow up with PCP Bradly Chris for neurogenic bladder Bradly Chris: Please refer the patient to Urology or Urogynecology for suspected diabetes related neurogenic bladder     Discharge Diagnoses: Principal Problem:   Abdominal pain Active Problems:   Uncontrolled type 1 diabetes mellitus with hyperglycemia, with long-term current use of insulin ([name])   Diabetic ketoacidosis ruled out   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  In the ER, she was hyperglycemic and alkalotic. Admitted on insulin drip.     Intermittently to urinate Abdominal pain The patient was admitted, a Foley catheter was placed.  Flomax was restarted  In the morning, she was able to tolerate diet well, her abdominal pain was resolved.  Her electrolytes were normal, renal function normal, glucoses improved on subcutaneous insulin.  Foley was removed, she was able to void, she was discharged with refill of Flomax  Suspect she has diabetic related neurogenic bladder, and Flomax is not an appropriate treatment.  Would recommend follow-up with urology or urogynecology for urodynamic  studies, more holistic management.    Diabetic ketoacidosis ruled out Uncontrolled type 1 diabetes Patient was hyperglycemic on admission.  She had no acidosis, she was treated with insulin drip and her blood sugars resolved.  She is poorly compliant, and has multiple comorbidities of uncontrolled diabetes          The [place] Controlled Substances Registry was reviewed for this patient prior to discharge.     Disposition: Home Diet recommendation:  Carb modified diet  DISCHARGE MEDICATION: Allergies as of 01/06/2024       Reactions   Codeine Hives, Anxiety, Other (See Comments)        Medication List     STOP taking these medications    oseltamivir 75 MG capsule Commonly known as: TAMIFLU       TAKE these medications    acetaminophen 500 MG tablet Commonly known as: TYLENOL Take 1,000 mg by mouth every 6 (six) hours as needed for moderate pain (pain score 4-6).   aspirin EC 81 MG tablet Take 81 mg by mouth daily. Swallow whole.   azelastine 0.1 % nasal spray Commonly known as: ASTELIN Place 1 spray into both nostrils as needed for rhinitis or allergies.   Dexcom G6 Transmitter Misc USE AS DIRECTED FOR CONTINUOUS GLUCOSE MONITORING. REUSE TRANSMITTER X 90DAYS THEN DISCARD & REPLACE   escitalopram 10 MG tablet Commonly known as: LEXAPRO Take 1 tablet (10 mg total) by mouth daily.   Glucagon Emergency 1 MG Kit Inject 1 mg into the skin See admin instructions. Inject one mg into the skin see administration instructions. Follow package directions for low blood sugar.  ipratropium 0.03 % nasal spray Commonly known as: ATROVENT Place into both nostrils.   levothyroxine 137 MCG tablet Commonly known as: SYNTHROID Take 137 mcg by mouth daily before breakfast.   metoCLOPramide 5 MG tablet Commonly known as: Reglan Take 1 tablet (5 mg total) by mouth 3 (three) times daily before meals.   NovoLOG FlexPen 100 UNIT/ML FlexPen Generic drug:  insulin aspart Inject 10 Units into the skin 3 (three) times daily with meals. Plus sliding scale TID   omega-3 acid ethyl esters 1 g capsule Commonly known as: LOVAZA Take 2 capsules (2 g total) by mouth 2 (two) times daily.   ondansetron 4 MG disintegrating tablet Commonly known as: ZOFRAN-ODT Take 1 tablet (4 mg total) by mouth every 4 (four) hours as needed.   ondansetron 4 MG tablet Commonly known as: ZOFRAN Take 2 tablets (8 mg total) by mouth every 8 (eight) hours as needed for nausea or vomiting.   pantoprazole 40 MG tablet Commonly known as: Protonix Take 1 tablet (40 mg total) by mouth 2 (two) times daily.   QUEtiapine 50 MG tablet Commonly known as: SEROQUEL Take 50 mg by mouth at bedtime.   rosuvastatin 10 MG tablet Commonly known as: CRESTOR Take 1 tablet (10 mg total) by mouth daily.   sucralfate 1 g tablet Commonly known as: CARAFATE Take 1 tablet (1 g total) by mouth 2 (two) times daily. Do not take any other medication within 2-4 hours.   tamsulosin 0.4 MG Caps capsule Commonly known as: FLOMAX Take 1 capsule (0.4 mg total) by mouth daily after supper.   Toujeo SoloStar 300 UNIT/ML Solostar Pen Generic drug: insulin glargine (1 Unit Dial) Inject 25 Units into the skin at bedtime.   traMADol 50 MG tablet Commonly known as: ULTRAM 1 to 2 tablets every 6 hours with extra strength Tylenol for pain as needed   traZODone 50 MG tablet Commonly known as: DESYREL Take 50 mg by mouth at bedtime as needed for sleep.        Follow-up Information     Everardo All, NP. Schedule an appointment as soon as possible for a visit in 1 week(s).   Specialty: Family Medicine                Discharge Instructions     Discharge instructions   Complete by: As directed    **IMPORTANT DISCHARGE INSTRUCTIONS**   From Dr. Maryfrances Bunnell: You were admitted for pain with urination.  Here, we restarted your Flomax and you were able to urinate  Your urine problems  appear to be due to diabetes (we call this "diabetic neurogenic bladder")  Go see Bradly Chris within 1 weke  Ask her to refill your Flomax  Ask her also for a referral to a Urologist to see if you need Flomax or if there is something else that will help better (I am not sure that there is)   Increase activity slowly   Complete by: As directed        Discharge Exam: Filed Weights   01/05/24 1437  Weight: 53.6 kg    General: Pt is alert, awake, not in acute distress Cardiovascular: RRR, nl S1-S2, no murmurs appreciated.   No LE edema.   Respiratory: Normal respiratory rate and rhythm.  CTAB without rales or wheezes. Abdominal: Abdomen soft and non-tender.  No distension or HSM.   Neuro/Psych: Strength symmetric in upper and lower extremities.  Judgment and insight appear normal.   Condition at discharge: fair  The results of significant diagnostics from this hospitalization (including imaging, microbiology, ancillary and laboratory) are listed below for reference.   Imaging Studies: CT Head Wo Contrast Result Date: 12/12/2023 CLINICAL DATA:  Head trauma, moderate-severe.  Fall.  Headache. EXAM: CT HEAD WITHOUT CONTRAST TECHNIQUE: Contiguous axial images were obtained from the base of the skull through the vertex without intravenous contrast. RADIATION DOSE REDUCTION: This exam was performed according to the departmental dose-optimization program which includes automated exposure control, adjustment of the mA and/or kV according to patient size and/or use of iterative reconstruction technique. COMPARISON:  Head CT 11/22/2021. FINDINGS: Brain: No acute intracranial hemorrhage. Gray-white differentiation is preserved. No hydrocephalus or extra-axial collection. No mass effect or midline shift. Vascular: No hyperdense vessel or unexpected calcification. Skull: No calvarial fracture or suspicious bone lesion. Skull base is unremarkable. Sinuses/Orbits: No acute finding. Other: None.  IMPRESSION: No acute intracranial abnormality. Electronically Signed   By: Orvan Falconer M.D.   On: 12/12/2023 09:57   DG Chest 2 View Result Date: 12/12/2023 CLINICAL DATA:  63 year old female with history of chest pain and shortness of breath. EXAM: CHEST - 2 VIEW COMPARISON:  Chest x-ray 12/10/2023. FINDINGS: Lung volumes are normal. No consolidative airspace disease. No pleural effusions. No pneumothorax. No pulmonary nodule or mass noted. Pulmonary vasculature and the cardiomediastinal silhouette are within normal limits. IMPRESSION: No radiographic evidence of acute cardiopulmonary disease. Electronically Signed   By: Trudie Reed M.D.   On: 12/12/2023 06:04   DG Chest 2 View Result Date: 12/10/2023 CLINICAL DATA:  Chest pain. Recent fall with known sternal fracture. EXAM: CHEST - 2 VIEW COMPARISON:  11/29/2023; chest CT-12/07/2023 FINDINGS: Unchanged cardiac silhouette and mediastinal contours. No focal parenchymal opacities. No pleural effusion or pneumothorax. No evidence of edema. Known nondistended subacute fracture involving the superior aspect of the sternum is not well evaluated on the present examination though without obvious interval displacement. Otherwise, no acute osseous abnormalities. IMPRESSION: 1. No acute cardiopulmonary disease. 2. Known nondisplaced subacute fracture involving the superior aspect of the sternum is not well evaluated on the present examination though without obvious interval displacement. Electronically Signed   By: Simonne Come M.D.   On: 12/10/2023 08:17    Microbiology: Results for orders placed or performed during the hospital encounter of 01/05/24  Resp panel by RT-PCR (RSV, Flu A&B, Covid) Anterior Nasal Swab     Status: None   Collection Time: 01/05/24  7:19 AM   Specimen: Anterior Nasal Swab  Result Value Ref Range Status   SARS Coronavirus 2 by RT PCR NEGATIVE NEGATIVE Final    Comment: (NOTE) SARS-CoV-2 target nucleic acids are NOT  DETECTED.  The SARS-CoV-2 RNA is generally detectable in upper respiratory specimens during the acute phase of infection. The lowest concentration of SARS-CoV-2 viral copies this assay can detect is 138 copies/mL. A negative result does not preclude SARS-Cov-2 infection and should not be used as the sole basis for treatment or other patient management decisions. A negative result may occur with  improper specimen collection/handling, submission of specimen other than nasopharyngeal swab, presence of viral mutation(s) within the areas targeted by this assay, and inadequate number of viral copies(<138 copies/mL). A negative result must be combined with clinical observations, patient history, and epidemiological information. The expected result is Negative.  Fact Sheet for Patients:  BloggerCourse.com  Fact Sheet for Healthcare Providers:  SeriousBroker.it  This test is no t yet approved or cleared by the [place] and  has been authorized for  detection and/or diagnosis of SARS-CoV-2 by FDA under an Emergency Use Authorization (EUA). This EUA will remain  in effect (meaning this test can be used) for the duration of the COVID-19 declaration under Section 564(b)(1) of the Act, 21 U.S.C.section 360bbb-3(b)(1), unless the authorization is terminated  or revoked sooner.       Influenza A by PCR NEGATIVE NEGATIVE Final   Influenza B by PCR NEGATIVE NEGATIVE Final    Comment: (NOTE) The Xpert Xpress SARS-CoV-2/FLU/RSV plus assay is intended as an aid in the diagnosis of influenza from Nasopharyngeal swab specimens and should not be used as a sole basis for treatment. Nasal washings and aspirates are unacceptable for Xpert Xpress SARS-CoV-2/FLU/RSV testing.  Fact Sheet for Patients: BloggerCourse.com  Fact Sheet for Healthcare Providers: SeriousBroker.it  This test is not yet  approved or cleared by the [place] FDA and has been authorized for detection and/or diagnosis of SARS-CoV-2 by FDA under an Emergency Use Authorization (EUA). This EUA will remain in effect (meaning this test can be used) for the duration of the COVID-19 declaration under Section 564(b)(1) of the Act, 21 U.S.C. section 360bbb-3(b)(1), unless the authorization is terminated or revoked.     Resp Syncytial Virus by PCR NEGATIVE NEGATIVE Final    Comment: (NOTE) Fact Sheet for Patients: BloggerCourse.com  Fact Sheet for Healthcare Providers: SeriousBroker.it  This test is not yet approved or cleared by the [place] FDA and has been authorized for detection and/or diagnosis of SARS-CoV-2 by FDA under an Emergency Use Authorization (EUA). This EUA will remain in effect (meaning this test can be used) for the duration of the COVID-19 declaration under Section 564(b)(1) of the Act, 21 U.S.C. section 360bbb-3(b)(1), unless the authorization is terminated or revoked.  Performed at [facility], [address] [place], [place] [address]   MRSA Next Gen by PCR, Nasal     Status: None   Collection Time: 01/05/24  3:07 PM   Specimen: Nasal Mucosa; Nasal Swab  Result Value Ref Range Status   MRSA by PCR Next Gen NOT DETECTED NOT DETECTED Final    Comment: (NOTE) The GeneXpert MRSA Assay (FDA approved for NASAL specimens only), is one component of a comprehensive MRSA colonization surveillance program. It is not intended to diagnose MRSA infection nor to guide or monitor treatment for MRSA infections. Test performance is not FDA approved in patients less than 82 years old. Performed at [facility], [address], [place], [place] [address]     Labs: CBC: Recent Labs  Lab 01/05/24 0645 01/06/24 0239  WBC 7.8 8.8  NEUTROABS 6.3  --   HGB 15.6* 12.6  HCT 50.6* 38.3  MCV 89.9 86.5   PLT 352 305   Basic Metabolic Panel: Recent Labs  Lab 01/05/24 0645 01/05/24 1208 01/05/24 1646 01/05/24 1942  NA 134* 138 136 136  K 3.6 3.5 3.4* 4.0  CL 87* 95* 97* 99  CO2 25 31 29 27   GLUCOSE 542* 129* 135* 129*  BUN 26* 22* 21* 20  CREATININE 0.84 0.54 0.62 0.61  CALCIUM 9.3 8.8* 8.7* 8.5*   Liver Function Tests: Recent Labs  Lab 01/05/24 0645  AST 12*  ALT 10  ALKPHOS 78  BILITOT 1.1  PROT 8.1  ALBUMIN 4.3   CBG: Recent Labs  Lab 01/05/24 1502 01/05/24 1607 01/05/24 2206 01/06/24 0734 01/06/24 1205  GLUCAP 113* 141* 194* 172* 172*    Discharge time spent: approximately 45 minutes spent on discharge counseling,  evaluation of patient on day of discharge, and coordination of discharge planning with nursing, social work, pharmacy and case management  Signed: Alberteen Sam, MD [facility] 01/06/2024

## 2024-01-06 NOTE — Plan of Care (Signed)
  Problem: Fluid Volume: Goal: Ability to maintain a balanced intake and output will improve Outcome: Progressing   Problem: Metabolic: Goal: Ability to maintain appropriate glucose levels will improve Outcome: Progressing   Problem: Nutritional: Goal: Maintenance of adequate nutrition will improve Outcome: Progressing   Problem: Respiratory: Goal: Will regain and/or maintain adequate ventilation Outcome: Progressing   Problem: Education: Goal: Knowledge of General Education information will improve Description: Including pain rating scale, medication(s)/side effects and non-pharmacologic comfort measures Outcome: Progressing   Problem: Clinical Measurements: Goal: Respiratory complications will improve Outcome: Progressing Goal: Cardiovascular complication will be avoided Outcome: Progressing   Problem: Nutrition: Goal: Adequate nutrition will be maintained Outcome: Progressing   Problem: Elimination: Goal: Will not experience complications related to bowel motility Outcome: Not Progressing Goal: Will not experience complications related to urinary retention Outcome: Not Progressing

## 2024-01-07 ENCOUNTER — Emergency Department (HOSPITAL_COMMUNITY)
Admission: EM | Admit: 2024-01-07 | Discharge: 2024-01-07 | Discharge status: Against Medical Advice | Payer: BC Managed Care – PPO | Attending: Emergency Medicine | Admitting: Emergency Medicine

## 2024-01-07 ENCOUNTER — Encounter (HOSPITAL_COMMUNITY): Payer: Self-pay | Admitting: Emergency Medicine

## 2024-01-07 DIAGNOSIS — R109 Unspecified abdominal pain: Secondary | ICD-10-CM | POA: Insufficient documentation

## 2024-01-07 DIAGNOSIS — Z5321 Procedure and treatment not carried out due to patient leaving prior to being seen by health care provider: Secondary | ICD-10-CM | POA: Insufficient documentation

## 2024-01-07 DIAGNOSIS — R110 Nausea: Secondary | ICD-10-CM | POA: Diagnosis not present

## 2024-01-07 DIAGNOSIS — R739 Hyperglycemia, unspecified: Secondary | ICD-10-CM | POA: Diagnosis not present

## 2024-01-07 NOTE — ED Triage Notes (Signed)
Pt here from home with c/o abd pain along with some nausea, cbg 244 , was here 2 days ago for hyperglycemia , 4mg  of zofran and 100 fentanyl given by ems

## 2024-01-07 NOTE — ED Notes (Signed)
Attempted to stick pt 2x, unable to obtain bloodwork. Pt made aware of UA sample

## 2024-01-10 ENCOUNTER — Emergency Department (HOSPITAL_COMMUNITY): Payer: BC Managed Care – PPO

## 2024-01-10 ENCOUNTER — Other Ambulatory Visit: Payer: Self-pay

## 2024-01-10 ENCOUNTER — Emergency Department (HOSPITAL_COMMUNITY)
Admission: EM | Admit: 2024-01-10 | Discharge: 2024-01-10 | Disposition: A | Discharge status: Home / Self Care | Payer: BC Managed Care – PPO | Attending: Emergency Medicine | Admitting: Emergency Medicine

## 2024-01-10 ENCOUNTER — Encounter (HOSPITAL_COMMUNITY): Payer: Self-pay

## 2024-01-10 DIAGNOSIS — Z794 Long term (current) use of insulin: Secondary | ICD-10-CM | POA: Insufficient documentation

## 2024-01-10 DIAGNOSIS — R739 Hyperglycemia, unspecified: Secondary | ICD-10-CM | POA: Insufficient documentation

## 2024-01-10 DIAGNOSIS — R109 Unspecified abdominal pain: Secondary | ICD-10-CM | POA: Insufficient documentation

## 2024-01-10 DIAGNOSIS — N3080 Other cystitis without hematuria: Secondary | ICD-10-CM | POA: Diagnosis not present

## 2024-01-10 DIAGNOSIS — K5900 Constipation, unspecified: Secondary | ICD-10-CM | POA: Insufficient documentation

## 2024-01-10 DIAGNOSIS — Z7982 Long term (current) use of aspirin: Secondary | ICD-10-CM | POA: Insufficient documentation

## 2024-01-10 DIAGNOSIS — N3081 Other cystitis with hematuria: Secondary | ICD-10-CM | POA: Diagnosis not present

## 2024-01-10 DIAGNOSIS — N390 Urinary tract infection, site not specified: Secondary | ICD-10-CM

## 2024-01-10 LAB — URINALYSIS, W/ REFLEX TO CULTURE (INFECTION SUSPECTED)
Bilirubin Urine: NEGATIVE
Glucose, UA: >=500 mg/dL — AB
Hgb urine dipstick: NEGATIVE
Ketones, ur: 5 mg/dL — AB
Nitrite: NEGATIVE
Protein, ur: 30 mg/dL — AB
Specific Gravity, Urine: 1.011 (ref 1.005–1.030)
WBC, UA: >50 WBC/hpf (ref 0–5)
pH: 6 (ref 5.0–8.0)

## 2024-01-10 LAB — CBC WITH DIFFERENTIAL/PLATELET
Abs Immature Granulocytes: 0.06 10*3/uL (ref 0.00–0.07)
Basophils Absolute: 0 10*3/uL (ref 0.0–0.1)
Basophils Relative: 1 %
Eosinophils Absolute: 0.1 10*3/uL (ref 0.0–0.5)
Eosinophils Relative: 1 %
HCT: 45.6 % (ref 36.0–46.0)
Hemoglobin: 14.4 g/dL (ref 12.0–15.0)
Immature Granulocytes: 1 %
Lymphocytes Relative: 27 %
Lymphs Abs: 2.3 10*3/uL (ref 0.7–4.0)
MCH: 27.5 pg (ref 26.0–34.0)
MCHC: 31.6 g/dL (ref 30.0–36.0)
MCV: 87.2 fL (ref 80.0–100.0)
Monocytes Absolute: 0.6 10*3/uL (ref 0.1–1.0)
Monocytes Relative: 8 %
Neutro Abs: 5.3 10*3/uL (ref 1.7–7.7)
Neutrophils Relative %: 62 %
Platelets: 307 10*3/uL (ref 150–400)
RBC: 5.23 MIL/uL — ABNORMAL HIGH (ref 3.87–5.11)
RDW: 14.8 % (ref 11.5–15.5)
WBC: 8.4 10*3/uL (ref 4.0–10.5)
nRBC: 0 % (ref 0.0–0.2)

## 2024-01-10 LAB — I-STAT CHEM 8, ED
BUN: 26 mg/dL — ABNORMAL HIGH (ref 6–20)
Calcium, Ion: 1.02 mmol/L — ABNORMAL LOW (ref 1.15–1.40)
Chloride: 101 mmol/L (ref 98–111)
Creatinine, Ser: 0.7 mg/dL (ref 0.44–1.00)
Glucose, Bld: 313 mg/dL — ABNORMAL HIGH (ref 70–99)
HCT: 45 % (ref 36.0–46.0)
Hemoglobin: 15.3 g/dL — ABNORMAL HIGH (ref 12.0–15.0)
Potassium: 4 mmol/L (ref 3.5–5.1)
Sodium: 136 mmol/L (ref 135–145)
TCO2: 27 mmol/L (ref 22–32)

## 2024-01-10 LAB — CBG MONITORING, ED: Glucose-Capillary: 291 mg/dL — ABNORMAL HIGH (ref 70–99)

## 2024-01-10 MED ORDER — ONDANSETRON HCL 4 MG/2ML IJ SOLN
4.0000 mg | Freq: Once | INTRAMUSCULAR | Status: AC
  Administered 2024-01-10: 4 mg via INTRAVENOUS
  Filled 2024-01-10: qty 2

## 2024-01-10 MED ORDER — SODIUM CHLORIDE 0.9 % IV BOLUS: 2000.0000 mL | Freq: Once | INTRAVENOUS | Status: DC

## 2024-01-10 MED ORDER — SODIUM CHLORIDE 0.9 % IV SOLN: INTRAVENOUS | Status: DC

## 2024-01-10 MED ORDER — CEPHALEXIN 500 MG PO CAPS: 500.0000 mg | ORAL_CAPSULE | Freq: Four times a day (QID) | ORAL | 0 refills | Status: DC

## 2024-01-10 MED ORDER — ONDANSETRON 8 MG PO TBDP: 8.0000 mg | ORAL_TABLET | Freq: Three times a day (TID) | ORAL | 0 refills | Status: DC | PRN

## 2024-01-10 MED ORDER — METOCLOPRAMIDE HCL 5 MG/ML IJ SOLN
10.0000 mg | Freq: Once | INTRAMUSCULAR | Status: AC
  Administered 2024-01-10: 10 mg via INTRAVENOUS
  Filled 2024-01-10: qty 2

## 2024-01-10 MED ORDER — SODIUM CHLORIDE 0.9 % IV BOLUS
1000.0000 mL | Freq: Once | INTRAVENOUS | Status: AC
  Administered 2024-01-10: 1000 mL via INTRAVENOUS

## 2024-01-10 NOTE — ED Notes (Signed)
Repeated enema with pt in bed, she was able to hold it a few minutes prior to going to bathroom.

## 2024-01-10 NOTE — ED Provider Notes (Signed)
[place] EMERGENCY DEPARTMENT AT [facility] Provider Note   CSN: [id number] Arrival date & time: 01/10/24  7564     History  Chief Complaint  Patient presents with  . Abdominal Pain    [name] [name] is a 63 y.o. female.  63 year old female with several recent ED visits presents with constipation x 10 days.  Denies any fever, vomiting.  States that she has passed some flatus.  Denies any prior abdominal surgeries.  Called EMS and was given 100 mcg of fentanyl.  BP was transiently decreased to 62/44.  Repeat however is 120/89.  She denies any syncope or near syncope.  No neurological symptoms in her lower extremities.  Does not take any opiate therapy at this time      Home Medications Prior to Admission medications   Medication Sig Start Date End Date Taking? Authorizing Provider  acetaminophen (TYLENOL) 500 MG tablet Take 1,000 mg by mouth every 6 (six) hours as needed for moderate pain (pain score 4-6).    [provider]  aspirin EC 81 MG tablet Take 81 mg by mouth daily. Swallow whole.    [provider]  azelastine (ASTELIN) 0.1 % nasal spray Place 1 spray into both nostrils as needed for rhinitis or allergies. 11/04/23   [provider]  Continuous Blood Gluc Transmit (DEXCOM G6 TRANSMITTER) MISC USE AS DIRECTED FOR CONTINUOUS GLUCOSE MONITORING. REUSE TRANSMITTER X 90DAYS THEN DISCARD & REPLACE 07/05/21   Bedsole, Amy E, MD  escitalopram (LEXAPRO) 10 MG tablet Take 1 tablet (10 mg total) by mouth daily. 11/18/23   Rodolph Bong, MD  Glucagon, rDNA, (GLUCAGON EMERGENCY) 1 MG KIT Inject 1 mg into the skin See admin instructions. Inject one mg into the skin see administration instructions. Follow package directions for low blood sugar. 06/18/23   [provider]  ipratropium (ATROVENT) 0.03 % nasal spray Place into both nostrils. 01/03/24   [provider]  levothyroxine (SYNTHROID) 137 MCG tablet Take 137 mcg by mouth  daily before breakfast. 03/19/22   [provider]  metoCLOPramide (REGLAN) 5 MG tablet Take 1 tablet (5 mg total) by mouth 3 (three) times daily before meals. 11/12/23   Danford, Earl Lites, MD  NOVOLOG FLEXPEN 100 UNIT/ML FlexPen Inject 10 Units into the skin 3 (three) times daily with meals. Plus sliding scale TID 12/08/23   Azucena Fallen, MD  omega-3 acid ethyl esters (LOVAZA) 1 g capsule Take 2 capsules (2 g total) by mouth 2 (two) times daily. 09/13/23   Alwyn Ren, MD  ondansetron (ZOFRAN) 4 MG tablet Take 2 tablets (8 mg total) by mouth every 8 (eight) hours as needed for nausea or vomiting. 12/25/23   Mansouraty, Netty Starring., MD  ondansetron (ZOFRAN-ODT) 4 MG disintegrating tablet Take 1 tablet (4 mg total) by mouth every 4 (four) hours as needed. 12/12/23   Arby Barrette, MD  pantoprazole (PROTONIX) 40 MG tablet Take 1 tablet (40 mg total) by mouth 2 (two) times daily. 12/25/23   Mansouraty, Netty Starring., MD  QUEtiapine (SEROQUEL) 50 MG tablet Take 50 mg by mouth at bedtime. 09/09/23   [provider]  rosuvastatin (CRESTOR) 10 MG tablet Take 1 tablet (10 mg total) by mouth daily. 11/18/23   Rodolph Bong, MD  sucralfate (CARAFATE) 1 g tablet Take 1 tablet (1 g total) by mouth 2 (two) times daily. Do not take any other medication within 2-4 hours. 12/25/23   Mansouraty, Netty Starring., MD  tamsulosin [facility])  0.4 MG CAPS capsule Take 1 capsule (0.4 mg total) by mouth daily after supper. 01/06/24   Danford, Earl Lites, MD  TOUJEO SOLOSTAR 300 UNIT/ML Solostar Pen Inject 25 Units into the skin at bedtime. 12/08/23   Azucena Fallen, MD  traMADol Janean Sark) 50 MG tablet 1 to 2 tablets every 6 hours with extra strength Tylenol for pain as needed 12/12/23   Arby Barrette, MD  traZODone (DESYREL) 50 MG tablet Take 50 mg by mouth at bedtime as needed for sleep.    [provider]      Allergies    Codeine    Review of Systems   Review of Systems   All other systems reviewed and are negative.   Physical Exam Updated Vital Signs BP 128/89   Pulse 84   Temp 98.1 F (36.7 C) (Oral)   Resp 18   LMP 08/11/2012   SpO2 98%  Physical Exam Vitals and nursing note reviewed.  Constitutional:      General: She is not in acute distress.    Appearance: Normal appearance. She is well-developed. She is not toxic-appearing.  HENT:     Head: Normocephalic and atraumatic.  Eyes:     General: Lids are normal.     Conjunctiva/sclera: Conjunctivae normal.     Pupils: Pupils are equal, round, and reactive to light.  Neck:     Thyroid: No thyroid mass.     Trachea: No tracheal deviation.  Cardiovascular:     Rate and Rhythm: Normal rate and regular rhythm.     Heart sounds: Normal heart sounds. No murmur heard.    No gallop.  Pulmonary:     Effort: Pulmonary effort is normal. No respiratory distress.     Breath sounds: Normal breath sounds. No stridor. No decreased breath sounds, wheezing, rhonchi or rales.  Abdominal:     General: There is no distension.     Palpations: Abdomen is soft.     Tenderness: There is no abdominal tenderness. There is no rebound.  Musculoskeletal:        General: No tenderness. Normal range of motion.     Cervical back: Normal range of motion and neck supple.  Skin:    General: Skin is warm and dry.     Findings: No abrasion or rash.  Neurological:     Mental Status: She is alert and oriented to person, place, and time. Mental status is at baseline.     GCS: GCS eye subscore is 4. GCS verbal subscore is 5. GCS motor subscore is 6.     Cranial Nerves: No cranial nerve deficit.     Sensory: No sensory deficit.     Motor: Motor function is intact.  Psychiatric:        Attention and Perception: Attention normal.        Speech: Speech normal.        Behavior: Behavior normal.    ED Results / Procedures / Treatments   Labs (all labs ordered are listed, but only abnormal results are displayed) Labs  Reviewed  CBG MONITORING, ED - Abnormal; Notable for the following components:      Result Value   Glucose-Capillary 291 (*)    All other components within normal limits    EKG EKG Interpretation Date/Time:  Friday January 10 2024 10:20:29 EST Ventricular Rate:  93 PR Interval:  153 QRS Duration:  86 QT Interval:  369 QTC Calculation: 459 R Axis:   99  Text Interpretation: Sinus  rhythm Right axis deviation Probable anteroseptal infarct, old No significant change since last tracing Confirmed by Lorre Nick ([id number]) on 01/10/2024 1:52:31 PM  Radiology No results found.  Procedures Procedures    Medications Ordered in ED Medications - No data to display  ED Course/ Medical Decision Making/ A&P                                  Medical Decision Making Amount and/or Complexity of Data Reviewed Labs: ordered. Radiology: ordered. ECG/medicine tests: ordered.  Risk Prescription drug management.  Patient had acute abdominal series which shows increased stool which was followed by abdominal CT which showed no evidence of obstruction.  Mild hyperglycemia noted and treated with IV fluids.  No evidence of DKA. Patient here with constipation and given enema x 2 and get resolved.  Patient's urinalysis shows infection.  Will place on antibiotics and discharge         Final Clinical Impression(s) / ED Diagnoses Final diagnoses:  None    Rx / DC Orders ED Discharge Orders     None         Lorre Nick, MD 01/10/24 [phone]

## 2024-01-10 NOTE — ED Triage Notes (Signed)
Patient BIB EMS for abdominal pain and nausea x 3 day. Hx of same. Given 100 mcg of fentanyl with EMS. BP on arrival 62/44.

## 2024-01-10 NOTE — ED Notes (Addendum)
1140 SSE given in bathroom, pt states she had a BM, flushed before staff could witness.

## 2024-01-11 ENCOUNTER — Emergency Department (HOSPITAL_COMMUNITY): Payer: BC Managed Care – PPO

## 2024-01-11 ENCOUNTER — Other Ambulatory Visit: Payer: Self-pay

## 2024-01-11 ENCOUNTER — Encounter (HOSPITAL_COMMUNITY): Payer: Self-pay

## 2024-01-11 ENCOUNTER — Inpatient Hospital Stay (HOSPITAL_COMMUNITY)
Admission: EM | Admit: 2024-01-11 | Discharge: 2024-01-14 | DRG: 690 | Disposition: A | Discharge status: Home / Self Care | Payer: BC Managed Care – PPO | Attending: Internal Medicine | Admitting: Internal Medicine

## 2024-01-11 DIAGNOSIS — Z833 Family history of diabetes mellitus: Secondary | ICD-10-CM

## 2024-01-11 DIAGNOSIS — N319 Neuromuscular dysfunction of bladder, unspecified: Secondary | ICD-10-CM | POA: Diagnosis present

## 2024-01-11 DIAGNOSIS — Z87891 Personal history of nicotine dependence: Secondary | ICD-10-CM

## 2024-01-11 DIAGNOSIS — Z1152 Encounter for screening for COVID-19: Secondary | ICD-10-CM

## 2024-01-11 DIAGNOSIS — B9620 Unspecified Escherichia coli [E. coli] as the cause of diseases classified elsewhere: Secondary | ICD-10-CM | POA: Diagnosis present

## 2024-01-11 DIAGNOSIS — Z79890 Hormone replacement therapy (postmenopausal): Secondary | ICD-10-CM

## 2024-01-11 DIAGNOSIS — Z90710 Acquired absence of both cervix and uterus: Secondary | ICD-10-CM

## 2024-01-11 DIAGNOSIS — Z794 Long term (current) use of insulin: Secondary | ICD-10-CM

## 2024-01-11 DIAGNOSIS — F331 Major depressive disorder, recurrent, moderate: Secondary | ICD-10-CM | POA: Diagnosis present

## 2024-01-11 DIAGNOSIS — G43909 Migraine, unspecified, not intractable, without status migrainosus: Secondary | ICD-10-CM | POA: Diagnosis present

## 2024-01-11 DIAGNOSIS — E1022 Type 1 diabetes mellitus with diabetic chronic kidney disease: Secondary | ICD-10-CM | POA: Diagnosis present

## 2024-01-11 DIAGNOSIS — K5900 Constipation, unspecified: Secondary | ICD-10-CM | POA: Diagnosis present

## 2024-01-11 DIAGNOSIS — K219 Gastro-esophageal reflux disease without esophagitis: Secondary | ICD-10-CM | POA: Diagnosis present

## 2024-01-11 DIAGNOSIS — E039 Hypothyroidism, unspecified: Secondary | ICD-10-CM | POA: Diagnosis present

## 2024-01-11 DIAGNOSIS — Z820 Family history of epilepsy and other diseases of the nervous system: Secondary | ICD-10-CM

## 2024-01-11 DIAGNOSIS — N182 Chronic kidney disease, stage 2 (mild): Secondary | ICD-10-CM | POA: Diagnosis present

## 2024-01-11 DIAGNOSIS — I10 Essential (primary) hypertension: Secondary | ICD-10-CM | POA: Diagnosis present

## 2024-01-11 DIAGNOSIS — N3080 Other cystitis without hematuria: Principal | ICD-10-CM | POA: Diagnosis present

## 2024-01-11 DIAGNOSIS — M810 Age-related osteoporosis without current pathological fracture: Secondary | ICD-10-CM | POA: Diagnosis present

## 2024-01-11 DIAGNOSIS — E109 Type 1 diabetes mellitus without complications: Secondary | ICD-10-CM | POA: Diagnosis present

## 2024-01-11 DIAGNOSIS — E1042 Type 1 diabetes mellitus with diabetic polyneuropathy: Secondary | ICD-10-CM | POA: Diagnosis present

## 2024-01-11 DIAGNOSIS — Z885 Allergy status to narcotic agent status: Secondary | ICD-10-CM

## 2024-01-11 DIAGNOSIS — Z79899 Other long term (current) drug therapy: Secondary | ICD-10-CM

## 2024-01-11 DIAGNOSIS — I5032 Chronic diastolic (congestive) heart failure: Secondary | ICD-10-CM | POA: Diagnosis present

## 2024-01-11 DIAGNOSIS — R339 Retention of urine, unspecified: Secondary | ICD-10-CM | POA: Diagnosis present

## 2024-01-11 DIAGNOSIS — E785 Hyperlipidemia, unspecified: Secondary | ICD-10-CM | POA: Diagnosis present

## 2024-01-11 DIAGNOSIS — F419 Anxiety disorder, unspecified: Secondary | ICD-10-CM | POA: Diagnosis present

## 2024-01-11 DIAGNOSIS — Z7982 Long term (current) use of aspirin: Secondary | ICD-10-CM

## 2024-01-11 DIAGNOSIS — Z8249 Family history of ischemic heart disease and other diseases of the circulatory system: Secondary | ICD-10-CM

## 2024-01-11 DIAGNOSIS — I130 Hypertensive heart and chronic kidney disease with heart failure and stage 1 through stage 4 chronic kidney disease, or unspecified chronic kidney disease: Secondary | ICD-10-CM | POA: Diagnosis present

## 2024-01-11 DIAGNOSIS — E1065 Type 1 diabetes mellitus with hyperglycemia: Secondary | ICD-10-CM | POA: Diagnosis present

## 2024-01-11 DIAGNOSIS — N3081 Other cystitis with hematuria: Principal | ICD-10-CM | POA: Diagnosis present

## 2024-01-11 LAB — CBC WITH DIFFERENTIAL/PLATELET
Abs Immature Granulocytes: 0.11 10*3/uL — ABNORMAL HIGH (ref 0.00–0.07)
Basophils Absolute: 0.1 10*3/uL (ref 0.0–0.1)
Basophils Relative: 0 %
Eosinophils Absolute: 0.1 10*3/uL (ref 0.0–0.5)
Eosinophils Relative: 1 %
HCT: 44.8 % (ref 36.0–46.0)
Hemoglobin: 14.8 g/dL (ref 12.0–15.0)
Immature Granulocytes: 1 %
Lymphocytes Relative: 29 %
Lymphs Abs: 3.4 10*3/uL (ref 0.7–4.0)
MCH: 28.3 pg (ref 26.0–34.0)
MCHC: 33 g/dL (ref 30.0–36.0)
MCV: 85.7 fL (ref 80.0–100.0)
Monocytes Absolute: 0.8 10*3/uL (ref 0.1–1.0)
Monocytes Relative: 7 %
Neutro Abs: 7.3 10*3/uL (ref 1.7–7.7)
Neutrophils Relative %: 62 %
Platelets: 390 10*3/uL (ref 150–400)
RBC: 5.23 MIL/uL — ABNORMAL HIGH (ref 3.87–5.11)
RDW: 15.1 % (ref 11.5–15.5)
WBC: 11.7 10*3/uL — ABNORMAL HIGH (ref 4.0–10.5)
nRBC: 0 % (ref 0.0–0.2)

## 2024-01-11 LAB — COMPREHENSIVE METABOLIC PANEL
ALT: 12 U/L (ref 0–44)
AST: 12 U/L — ABNORMAL LOW (ref 15–41)
Albumin: 4 g/dL (ref 3.5–5.0)
Alkaline Phosphatase: 67 U/L (ref 38–126)
Anion gap: 12 (ref 5–15)
BUN: 20 mg/dL (ref 6–20)
CO2: 23 mmol/L (ref 22–32)
Calcium: 9 mg/dL (ref 8.9–10.3)
Chloride: 98 mmol/L (ref 98–111)
Creatinine, Ser: 0.64 mg/dL (ref 0.44–1.00)
GFR, Estimated: >60 mL/min (ref >60)
Glucose, Bld: 233 mg/dL — ABNORMAL HIGH (ref 70–99)
Potassium: 3.9 mmol/L (ref 3.5–5.1)
Sodium: 133 mmol/L — ABNORMAL LOW (ref 135–145)
Total Bilirubin: 0.7 mg/dL (ref 0.0–1.2)
Total Protein: 7.4 g/dL (ref 6.5–8.1)

## 2024-01-11 LAB — URINALYSIS, W/ REFLEX TO CULTURE (INFECTION SUSPECTED)
Bilirubin Urine: NEGATIVE
Glucose, UA: >=500 mg/dL — AB
Ketones, ur: 20 mg/dL — AB
Nitrite: NEGATIVE
Protein, ur: 100 mg/dL — AB
RBC / HPF: >50 RBC/hpf (ref 0–5)
Specific Gravity, Urine: 1.024 (ref 1.005–1.030)
WBC, UA: >50 WBC/hpf (ref 0–5)
pH: 5 (ref 5.0–8.0)

## 2024-01-11 LAB — GLUCOSE, CAPILLARY: Glucose-Capillary: 204 mg/dL — ABNORMAL HIGH (ref 70–99)

## 2024-01-11 LAB — CBG MONITORING, ED: Glucose-Capillary: 228 mg/dL — ABNORMAL HIGH (ref 70–99)

## 2024-01-11 LAB — LACTIC ACID, PLASMA: Lactic Acid, Venous: 0.9 mmol/L (ref 0.5–1.9)

## 2024-01-11 MED ORDER — MORPHINE SULFATE (PF) 2 MG/ML IV SOLN: 2.0000 mg | INTRAVENOUS | Status: DC | PRN

## 2024-01-11 MED ORDER — FENTANYL CITRATE PF 50 MCG/ML IJ SOSY
50.0000 ug | PREFILLED_SYRINGE | Freq: Once | INTRAMUSCULAR | Status: AC
  Administered 2024-01-11: 50 ug via INTRAVENOUS
  Filled 2024-01-11: qty 1

## 2024-01-11 MED ORDER — SODIUM CHLORIDE 0.9 % IV SOLN
2.0000 g | Freq: Every day | INTRAVENOUS | Status: DC
  Administered 2024-01-11 – 2024-01-13 (×3): 2 g via INTRAVENOUS
  Filled 2024-01-11 (×4): qty 20

## 2024-01-11 MED ORDER — KETOROLAC TROMETHAMINE 15 MG/ML IJ SOLN
15.0000 mg | Freq: Once | INTRAMUSCULAR | Status: AC
  Administered 2024-01-11: 15 mg via INTRAVENOUS
  Filled 2024-01-11: qty 1

## 2024-01-11 MED ORDER — HYDROCODONE-ACETAMINOPHEN 5-325 MG PO TABS
1.0000 | ORAL_TABLET | ORAL | Status: DC | PRN
  Administered 2024-01-12 (×2): 2 via ORAL
  Administered 2024-01-13 – 2024-01-14 (×3): 1 via ORAL
  Filled 2024-01-11 (×2): qty 1
  Filled 2024-01-11 (×2): qty 2
  Filled 2024-01-11 (×2): qty 1

## 2024-01-11 MED ORDER — FLUCONAZOLE 150 MG PO TABS
150.0000 mg | ORAL_TABLET | Freq: Once | ORAL | Status: AC
  Administered 2024-01-12: 150 mg via ORAL
  Filled 2024-01-11: qty 1

## 2024-01-11 MED ORDER — MORPHINE SULFATE (PF) 2 MG/ML IV SOLN
1.0000 mg | INTRAVENOUS | Status: DC | PRN
  Administered 2024-01-12: 1 mg via INTRAVENOUS
  Filled 2024-01-11: qty 1

## 2024-01-11 MED ORDER — IOHEXOL 300 MG/ML  SOLN
100.0000 mL | Freq: Once | INTRAMUSCULAR | Status: AC | PRN
  Administered 2024-01-11: 100 mL via INTRAVENOUS

## 2024-01-11 MED ORDER — INSULIN ASPART 100 UNIT/ML IJ SOLN
0.0000 [IU] | Freq: Three times a day (TID) | INTRAMUSCULAR | Status: DC
  Administered 2024-01-12: 5 [IU] via SUBCUTANEOUS
  Filled 2024-01-11: qty 0.15

## 2024-01-11 MED ORDER — ONDANSETRON HCL 4 MG/2ML IJ SOLN
4.0000 mg | Freq: Once | INTRAMUSCULAR | Status: AC
  Administered 2024-01-11: 4 mg via INTRAVENOUS
  Filled 2024-01-11: qty 2

## 2024-01-11 MED ORDER — ONDANSETRON HCL 4 MG PO TABS: 4.0000 mg | ORAL_TABLET | Freq: Four times a day (QID) | ORAL | Status: DC | PRN

## 2024-01-11 MED ORDER — ACETAMINOPHEN 325 MG PO TABS: 650.0000 mg | ORAL_TABLET | Freq: Four times a day (QID) | ORAL | Status: DC | PRN

## 2024-01-11 MED ORDER — ONDANSETRON HCL 4 MG/2ML IJ SOLN
4.0000 mg | Freq: Four times a day (QID) | INTRAMUSCULAR | Status: DC | PRN
  Administered 2024-01-12: 4 mg via INTRAVENOUS
  Filled 2024-01-11 (×2): qty 2

## 2024-01-11 MED ORDER — ACETAMINOPHEN 650 MG RE SUPP: 650.0000 mg | Freq: Four times a day (QID) | RECTAL | Status: DC | PRN

## 2024-01-11 NOTE — H&P (Signed)
History and Physical    Patient: [name] [name]:[id number] DOB: 1960-12-19 DOA: 01/11/2024 DOS: the patient was seen and examined on 01/11/2024 PCP: Everardo All, NP  Patient coming from: Home - lives with her husband. Ambulates independently    Chief Complaint: abdominal pain, dysuria and hematuria.   HPI: [name] [name] is a 63 y.o. female with medical history significant of T2DM, CHF, GERD, HTN, HLD, hypothyroidism, IBS how presented to ED with complaints of abdominal pain. She came to ED yesterday because she thought she was constipated and found to have a UTI. She didn't pick up her medication as the pharmacy was closed. She went home and around midnight she went to the bathroom and had excruciating pain with urination and the pain radiated to her back. She said her urine was all blood. She called EMS. She denies any fevers, but has had some chills. She has no history of recurrent UTIs. No hx of kidney stones. She has pain in her suprapubic area rated as a 10/10 and is constant. Pain is sharp and radiates to her back. She has associated nausea and dysuria.   Denies any fever, vision changes/headaches, chest pain or palpitations, shortness of breath or cough, V/D, or leg swelling.    She does not smoke or drink alcohol.   ER Course:  vitals: afebrile, bp: 109/85, H:R 90, RR: 16, oxygen: 100%RA Pertinent labs: wbc: 11.7,  CT abdomen/pelvis: Marked bladder wall thickening with intraluminal and intramural gas, most consistent with emphysematous cystitis. 2.  Aortic Atherosclerosis (ICD10-I70.0). In ED: urine culture ordered. Urology consulted. Foley inserted. TRH aked to admit.     Review of Systems: As mentioned in the history of present illness. All other systems reviewed and are negative. Past Medical History:  Diagnosis Date   Allergy    seasonal   Anxiety    B12 deficiency    Chest pain 04/05/2017   CHF (congestive heart failure) ([name])    Diabetes mellitus type 1,  uncontrolled    "dr. 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) 10/29/2013   Leg cramping    "@ night" (09/18/2013)   Migraine    "once q couple months" (11/26/2018)   Osteoporosis    Peripheral neuropathy    Umbilical hernia    unrepaired (09/18/2013)   Past Surgical History:  Procedure Laterality Date   BIOPSY  06/03/2019   Procedure: BIOPSY;  Surgeon: Lynann Bologna, MD;  Location: [facility];  Service: Endoscopy;;   BIOPSY  08/17/2020   Procedure: BIOPSY;  Surgeon: Tressia Danas, MD;  Location: [facility];  Service: Gastroenterology;;   BIOPSY  02/05/2023   Procedure: BIOPSY;  Surgeon: Meryl Dare, MD;  Location: [facility];  Service: Gastroenterology;;   BIOPSY  09/24/2023   Procedure: BIOPSY;  Surgeon: Lemar Lofty., MD;  Location: [facility];  Service: Gastroenterology;;   CESAREAN SECTION  1989   ENTEROSCOPY N/A 06/03/2019   Procedure: ENTEROSCOPY;  Surgeon: Lynann Bologna, MD;  Location: [facility];  Service: Endoscopy;  Laterality: N/A;   ESOPHAGOGASTRODUODENOSCOPY (EGD) WITH PROPOFOL N/A 08/17/2020   Procedure: ESOPHAGOGASTRODUODENOSCOPY (EGD) WITH PROPOFOL;  Surgeon: Tressia Danas, MD;  Location: [facility];  Service: Gastroenterology;  Laterality: N/A;   ESOPHAGOGASTRODUODENOSCOPY (  EGD) WITH PROPOFOL N/A 02/05/2023   Procedure: ESOPHAGOGASTRODUODENOSCOPY (EGD) WITH PROPOFOL;  Surgeon: Meryl Dare, MD;  Location: [facility];  Service: Gastroenterology;  Laterality: N/A;   ESOPHAGOGASTRODUODENOSCOPY (EGD) WITH PROPOFOL N/A 09/24/2023   Procedure: ESOPHAGOGASTRODUODENOSCOPY (EGD) WITH PROPOFOL;  Surgeon: Meridee Score Netty Starring., MD;  Location: [facility];  Service: Gastroenterology;   Laterality: N/A;   EUS N/A 09/24/2023   Procedure: UPPER ENDOSCOPIC ULTRASOUND (EUS) LINEAR;  Surgeon: Lemar Lofty., MD;  Location: [facility];  Service: Gastroenterology;  Laterality: N/A;   LAPAROSCOPIC ASSISTED VAGINAL HYSTERECTOMY  09/23/2012   Procedure: LAPAROSCOPIC ASSISTED VAGINAL HYSTERECTOMY;  Surgeon: Mitchel Honour, DO;  Location: [facility];  Service: Gynecology;  Laterality: N/A;  pull Dr Vance Gather instrument   LEFT HEART CATH AND CORONARY ANGIOGRAPHY N/A 02/11/2017   Procedure: Left Heart Cath and Coronary Angiography;  Surgeon: Runell Gess, MD;  Location: [facility];  Service: Cardiovascular;  Laterality: N/A;   TUBAL LIGATION Bilateral 1992   Social History:  reports that she quit smoking about 33 years ago. Her smoking use included cigarettes. She started smoking about 43 years ago. She has a 1.2 pack-year smoking history. She has never used smokeless tobacco. She reports that she does not currently use drugs after having used the following drugs: Marijuana. She reports that she does not drink alcohol.  Allergies  Allergen Reactions   Codeine Hives, Anxiety and Other (See Comments)    Family History  Problem Relation Age of Onset   Heart failure Mother 41       Her heart skipped a beat and stopped - per pt   Heart failure Brother    Diabetes Brother    Diabetes Maternal Grandmother    Alzheimer's disease Maternal Grandmother    Coronary artery disease Maternal Grandmother    Heart attack Maternal Grandfather    Diabetes Other    Heart disease Other    Colon polyps Neg Hx    Esophageal cancer Neg Hx    Liver cancer Neg Hx    Pancreatic cancer Neg Hx    Stomach cancer Neg Hx    Crohn's disease Neg Hx    Rectal cancer Neg Hx    Colon cancer Neg Hx     Prior to Admission medications   Medication Sig Start Date End Date Taking? Authorizing Provider  acetaminophen (TYLENOL) 500 MG tablet Take 1,000 mg by mouth every 6 (six) hours as needed for  moderate pain (pain score 4-6).    [provider]  aspirin EC 81 MG tablet Take 81 mg by mouth daily. Swallow whole.    [provider]  azelastine (ASTELIN) 0.1 % nasal spray Place 1 spray into both nostrils as needed for rhinitis or allergies. 11/04/23   [provider]  cephALEXin (KEFLEX) 500 MG capsule Take 1 capsule (500 mg total) by mouth 4 (four) times daily. 01/10/24   Lorre Nick, MD  Continuous Blood Gluc Transmit (DEXCOM G6 TRANSMITTER) MISC USE AS DIRECTED FOR CONTINUOUS GLUCOSE MONITORING. REUSE TRANSMITTER X 90DAYS THEN DISCARD & REPLACE 07/05/21   Bedsole, Amy E, MD  escitalopram (LEXAPRO) 10 MG tablet Take 1 tablet (10 mg total) by mouth daily. 11/18/23   Rodolph Bong, MD  Glucagon, rDNA, (GLUCAGON EMERGENCY) 1 MG KIT Inject 1 mg into the skin See admin instructions. Inject one mg into the skin see administration instructions. Follow package directions for low blood sugar. 06/18/23   [provider]  ipratropium (ATROVENT) 0.03 % nasal spray Place  into both nostrils. 01/03/24   [provider]  levothyroxine (SYNTHROID) 137 MCG tablet Take 137 mcg by mouth daily before breakfast. 03/19/22   [provider]  metoCLOPramide (REGLAN) 5 MG tablet Take 1 tablet (5 mg total) by mouth 3 (three) times daily before meals. 11/12/23   Danford, Earl Lites, MD  NOVOLOG FLEXPEN 100 UNIT/ML FlexPen Inject 10 Units into the skin 3 (three) times daily with meals. Plus sliding scale TID 12/08/23   Azucena Fallen, MD  omega-3 acid ethyl esters (LOVAZA) 1 g capsule Take 2 capsules (2 g total) by mouth 2 (two) times daily. 09/13/23   Alwyn Ren, MD  ondansetron (ZOFRAN) 4 MG tablet Take 2 tablets (8 mg total) by mouth every 8 (eight) hours as needed for nausea or vomiting. 12/25/23   Mansouraty, Netty Starring., MD  ondansetron (ZOFRAN-ODT) 4 MG disintegrating tablet Take 1 tablet (4 mg total) by mouth every 4 (four) hours as needed.  12/12/23   Arby Barrette, MD  ondansetron (ZOFRAN-ODT) 8 MG disintegrating tablet Take 1 tablet (8 mg total) by mouth every 8 (eight) hours as needed for nausea or vomiting. 01/10/24   Lorre Nick, MD  pantoprazole (PROTONIX) 40 MG tablet Take 1 tablet (40 mg total) by mouth 2 (two) times daily. 12/25/23   Mansouraty, Netty Starring., MD  QUEtiapine (SEROQUEL) 50 MG tablet Take 50 mg by mouth at bedtime. 09/09/23   [provider]  rosuvastatin (CRESTOR) 10 MG tablet Take 1 tablet (10 mg total) by mouth daily. 11/18/23   Rodolph Bong, MD  sucralfate (CARAFATE) 1 g tablet Take 1 tablet (1 g total) by mouth 2 (two) times daily. Do not take any other medication within 2-4 hours. 12/25/23   Mansouraty, Netty Starring., MD  tamsulosin (FLOMAX) 0.4 MG CAPS capsule Take 1 capsule (0.4 mg total) by mouth daily after supper. 01/06/24   Danford, Earl Lites, MD  TOUJEO SOLOSTAR 300 UNIT/ML Solostar Pen Inject 25 Units into the skin at bedtime. 12/08/23   Azucena Fallen, MD  traMADol Janean Sark) 50 MG tablet 1 to 2 tablets every 6 hours with extra strength Tylenol for pain as needed 12/12/23   Arby Barrette, MD  traZODone (DESYREL) 50 MG tablet Take 50 mg by mouth at bedtime as needed for sleep.    [provider]    Physical Exam: Vitals:   01/11/24 0122 01/11/24 0430 01/11/24 0950 01/11/24 1340  BP: 109/85 (!) 124/95 (!) 117/103 (!) 136/99  Pulse: 90 86 96 89  Resp: 16 19 16 16   Temp: 98.5 F (36.9 C) 98.4 F (36.9 C) (!) 97.4 F (36.3 C)   TempSrc: Oral Oral Oral   SpO2: 100% 98% 98% 99%   General:  Appears calm and comfortable and is in NAD Eyes:  PERRL, EOMI, normal lids, iris ENT:  grossly normal hearing, lips & tongue, mmm; poor dentition Neck:  no LAD, masses or thyromegaly; no carotid bruits Cardiovascular:  RRR, no m/r/g. No LE edema.  Respiratory:   CTA bilaterally with no wheezes/rales/rhonchi.  Normal respiratory effort. Abdomen:  soft, TTP in lower  quadrants/suprapubic, ND, NABS Back:   normal alignment, no CVAT Skin:  no rash or induration seen on limited exam Musculoskeletal:  grossly normal tone BUE/BLE, good ROM, no bony abnormality Lower extremity:  No LE edema.  Limited foot exam with no ulcerations.  2+ distal pulses. Psychiatric:  grossly normal mood and affect, speech fluent and appropriate, AOx3 Neurologic:  CN 2-12 grossly intact, moves  all extremities in coordinated fashion, sensation intact   Radiological Exams on Admission: Independently reviewed - see discussion in A/P where applicable  CT ABDOMEN PELVIS W CONTRAST Result Date: 01/11/2024 CLINICAL DATA:  Abdominal pain and hematuria. Pyelonephritis suspected. EXAM: CT ABDOMEN AND PELVIS WITH CONTRAST TECHNIQUE: Multidetector CT imaging of the abdomen and pelvis was performed using the standard protocol following bolus administration of intravenous contrast. RADIATION DOSE REDUCTION: This exam was performed according to the departmental dose-optimization program which includes automated exposure control, adjustment of the mA and/or kV according to patient size and/or use of iterative reconstruction technique. CONTRAST:  OMNIPAQUE IOHEXOL 300 MG/ML  SOLN COMPARISON:  01/10/2024 FINDINGS: Lower chest: Clear lung bases. Normal heart size without pericardial or pleural effusion. Hepatobiliary: Focal steatosis adjacent the falciform ligament. Hepatomegaly versus a Riedel's lobe at 20.5 cm. Normal gallbladder, without biliary ductal dilatation. Pancreas: Normal, without mass or ductal dilatation. Spleen: Normal in size, without focal abnormality. Adrenals/Urinary Tract: Normal adrenal glands. Normal kidneys, without hydronephrosis. There is marked bladder wall thickening. Circumferential gas about the periphery of the bladder lumen is favored to be mucosal. There is small volume intraluminal gas as well. Example 65/2. Stomach/Bowel: Gastric antral underdistention. Apparent wall  thickening is likely secondary. Normal colon, appendix, and terminal ileum. Normal small bowel. Vascular/Lymphatic: Aortic atherosclerosis. No abdominopelvic adenopathy. Reproductive: Hysterectomy.  No adnexal mass. Other: No significant free fluid. No extracystic gas. No free intraperitoneal air. Small fat containing ventral abdominal wall hernia. Musculoskeletal: Transitional S1 vertebral body. Degenerative disc disease at L4-5. IMPRESSION: 1. Marked bladder wall thickening with intraluminal and intramural gas, most consistent with emphysematous cystitis. 2.  Aortic Atherosclerosis (ICD10-I70.0). Electronically Signed   By: Jeronimo Greaves M.D.   On: 01/11/2024 15:50   CT ABDOMEN PELVIS WO CONTRAST Result Date: 01/10/2024 CLINICAL DATA:  Three day history of abdominal pain and nausea EXAM: CT ABDOMEN AND PELVIS WITHOUT CONTRAST TECHNIQUE: Multidetector CT imaging of the abdomen and pelvis was performed following the standard protocol without IV contrast. RADIATION DOSE REDUCTION: This exam was performed according to the departmental dose-optimization program which includes automated exposure control, adjustment of the mA and/or kV according to patient size and/or use of iterative reconstruction technique. COMPARISON:  CT abdomen and pelvis dated 11/11/2023 FINDINGS: Lower chest: No focal consolidation or pulmonary nodule in the lung bases. No pleural effusion or pneumothorax demonstrated. Partially imaged heart size is normal. Hepatobiliary: No focal hepatic lesions. No intra or extrahepatic biliary ductal dilation. Normal gallbladder. Pancreas: No focal lesions or main ductal dilation. Spleen: Normal in size without focal abnormality. Adrenals/Urinary Tract: No adrenal nodules. No suspicious renal mass on this noncontrast enhanced examination, calculi or hydronephrosis. Moderately distended urinary bladder demonstrates mild circumferential mural thickening. Stomach/Bowel: Mild mural thickening of the partially  imaged distal esophagus. Normal appearance of the stomach. No evidence of bowel wall thickening, distention, or inflammatory changes. Colonic diverticulosis without acute diverticulitis. Normal appendix. Vascular/Lymphatic: Aortic atherosclerosis. No enlarged abdominal or pelvic lymph nodes. Reproductive: No adnexal masses. Other: No free fluid, fluid collection, or free air. Musculoskeletal: No acute or abnormal lytic or blastic osseous lesions. Small fat-containing paraumbilical hernia. IMPRESSION: 1. Mild circumferential mural thickening of the partially imaged distal esophagus, which can be seen in the setting of esophagitis. 2. Moderately distended urinary bladder demonstrates mild circumferential mural thickening, which may be due to cystitis. Recommend correlation with urinalysis and urinary retention. 3. Colonic diverticulosis without acute diverticulitis. 4.  Aortic Atherosclerosis (ICD10-I70.0). Electronically Signed   By: Milus Height.D.  On: 01/10/2024 10:26   DG ABD ACUTE 2+V W 1V CHEST Result Date: 01/10/2024 CLINICAL DATA:  One-week history of constipation and nausea EXAM: DG ABDOMEN ACUTE WITH 1 VIEW CHEST COMPARISON:  CT abdomen and pelvis dated 11/11/2023 FINDINGS: Lines/tubes: None. Chest: Lungs are clear without focal consolidation. No pneumothorax or pleural effusion. Normal heart size. Abdomen: Nonobstructive bowel gas pattern. No pneumatosis or free air. Moderate volume stool within the colon. No abnormal calcification. Pelvic soft tissue density, likely corresponding to distended urinary bladder. Bones: No acute osseous abnormality. IMPRESSION: 1. Nonobstructive bowel gas pattern. Moderate volume stool within the colon. 2. Pelvic soft tissue density, likely corresponding to distended urinary bladder. Recommend correlation with urinary retention. Electronically Signed   By: Agustin Cree M.D.   On: 01/10/2024 09:58    EKG: Independently reviewed.  NSR with rate 93; nonspecific ST changes  with no evidence of acute ischemia   Labs on Admission: I have personally reviewed the available labs and imaging studies at the time of the admission.  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  -obs to med surg  -urology consulted -recommended foley be placed and 14 days of antibiotics  -no signs of sepsis, lactic acid wnl -f/u on urine culture from yesterday -BC pending  -continue rocephin -gave dose of fluconazole for yeast on UA  -pain medication, encouraged oral intake of fluids    Uncontrolled type 1 diabetes mellitus with hyperglycemia, with long-term current use of insulin ([name]) A1C of 12.1 in 11/2023 Continue long acting insulin  Moderate SSI and accuchecks QAC/HS   Hypothyroidism Continue synthroid  TSH remains elevated at 16 (6 days ago)  Has f/u with endocrinology next week   Chronic diastolic HF (heart failure) ([name]) Euvolemic Daily weights Echo 12/2020: normal EF, grade 1 DD   Hyperlipidemia Continue crestor and lovaza   GERD Continue reglan, protonix and carafate   Kidney disease, chronic, 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 Corinna Gab   DVT Prophylaxis: SCDs   Family  Communication: none   Severity of Illness: The appropriate patient status for this patient is OBSERVATION. Observation status is judged to be reasonable and necessary in order to provide the required intensity of service to ensure the patient's safety. The patient's presenting symptoms, physical exam findings, and initial radiographic and laboratory data in the context of their medical condition is felt to place them at decreased risk for further clinical deterioration. Furthermore, it is anticipated that the patient will be medically stable for discharge from the hospital within 2 midnights of admission.   Author: Orland Mustard, MD 01/11/2024 6:59 PM  For on call review [email or web address].

## 2024-01-11 NOTE — ED Triage Notes (Signed)
Pt. [facility] for abdominal pain and hematuria

## 2024-01-11 NOTE — Procedures (Signed)
Foley Catheter Placement Note  Indications: 63 y.o. female with emphysematous cystitis.   Pre-operative Diagnosis: Urinary retention  Post-operative Diagnosis: Same  Surgeon: Jerald Kief, MD  Assistants: None  Procedure Details  Patient was placed in the supine position, prepped with Betadine and draped in the usual sterile fashion.  We injected lubricating jelly per urethra prior to the procedure to open up the urethra. We then inserted a 16 [place] coude catheter per urethra which easily passed into the bladder without any resistance.  We achieved return of watermelon thin urine and then proceeded to insert 10 mL of sterile water into the Foley balloon.  The catheter was attached to a drainage bag and secured with a StatLock               Complications: None; patient tolerated the procedure well.  Plan:   1.  Continue foley catheter to drainage. See consult note for further detail.        Attending Attestation: Dr. Mena Goes was available.

## 2024-01-11 NOTE — Assessment & Plan Note (Signed)
 Continue lexapro  ?

## 2024-01-11 NOTE — ED Provider Notes (Signed)
[place] EMERGENCY DEPARTMENT AT [facility] Provider Note   CSN: [id number] Arrival date & time: 01/11/24  0113     History  Chief Complaint  Patient presents with   Abdominal Pain    [name] [name] Pulse is a 63 y.o. female with history of diabetes who presents with complaints of dysuria, abdominal pain, hematuria and flank pain.  Patient was evaluated yesterday at this facility with complaints of dysuria.  Found to have a UTI.  Discharged on p.o. antibiotics.  However patient was unable to pick up antibiotics.  Last night she started having flank pain and woke this morning with hematuria.  She states she has had numerous UTIs in the past but never like this.  She reports she is feeling nauseous without emesis or diarrhea.  No history of kidney stones.   Abdominal Pain     Past Medical History:  Diagnosis Date   Allergy    seasonal   Anxiety    B12 deficiency    Chest pain 04/05/2017   CHF (congestive heart failure) ([name])    Diabetes mellitus type 1, uncontrolled    "dr. 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 Authorizing Provider  acetaminophen (TYLENOL) 500 MG tablet Take 1,000 mg by mouth every 6 (six) hours as needed for moderate pain (pain score 4-6).    [provider]  aspirin EC 81 MG tablet Take 81 mg by mouth daily. Swallow whole.    [provider]  azelastine (ASTELIN) 0.1 % nasal spray  Place 1 spray into both nostrils as needed for rhinitis or allergies. 11/04/23   [provider]  cephALEXin (KEFLEX) 500 MG capsule Take 1 capsule (500 mg total) by mouth 4 (four) times daily. 01/10/24   Lorre Nick, MD  Continuous Blood Gluc Transmit (DEXCOM G6 TRANSMITTER) MISC USE AS DIRECTED FOR CONTINUOUS GLUCOSE MONITORING. REUSE TRANSMITTER X 90DAYS THEN DISCARD & REPLACE 07/05/21   Bedsole, Amy E, MD  escitalopram (LEXAPRO) 10 MG tablet Take 1 tablet (10 mg total) by mouth daily. 11/18/23   Rodolph Bong, MD  Glucagon, rDNA, (GLUCAGON EMERGENCY) 1 MG KIT Inject 1 mg into the skin See admin instructions. Inject one mg into the skin see administration instructions. Follow package directions for low blood sugar. 06/18/23   [provider]  ipratropium (ATROVENT) 0.03 % nasal spray Place into both nostrils. 01/03/24   [provider]  levothyroxine (SYNTHROID) 137 MCG tablet Take 137 mcg by mouth daily before breakfast. 03/19/22   [provider]  metoCLOPramide (REGLAN) 5 MG tablet Take 1 tablet (5 mg total) by mouth 3 (three) times daily before meals. 11/12/23   Danford, Earl Lites, MD  NOVOLOG FLEXPEN 100 UNIT/ML FlexPen Inject 10 Units into the skin 3 (three) times daily with meals. Plus sliding scale TID 12/08/23   Azucena Fallen, MD  omega-3 acid ethyl  esters (LOVAZA) 1 g capsule Take 2 capsules (2 g total) by mouth 2 (two) times daily. 09/13/23   Alwyn Ren, MD  ondansetron (ZOFRAN) 4 MG tablet Take 2 tablets (8 mg total) by mouth every 8 (eight) hours as needed for nausea or vomiting. 12/25/23   Mansouraty, Netty Starring., MD  ondansetron (ZOFRAN-ODT) 4 MG disintegrating tablet Take 1 tablet (4 mg total) by mouth every 4 (four) hours as needed. 12/12/23   Arby Barrette, MD  ondansetron (ZOFRAN-ODT) 8 MG disintegrating tablet Take 1 tablet (8 mg total) by mouth every 8 (eight) hours as needed for nausea or vomiting. 01/10/24   Lorre Nick,  MD  pantoprazole (PROTONIX) 40 MG tablet Take 1 tablet (40 mg total) by mouth 2 (two) times daily. 12/25/23   Mansouraty, Netty Starring., MD  QUEtiapine (SEROQUEL) 50 MG tablet Take 50 mg by mouth at bedtime. 09/09/23   [provider]  rosuvastatin (CRESTOR) 10 MG tablet Take 1 tablet (10 mg total) by mouth daily. 11/18/23   Rodolph Bong, MD  sucralfate (CARAFATE) 1 g tablet Take 1 tablet (1 g total) by mouth 2 (two) times daily. Do not take any other medication within 2-4 hours. 12/25/23   Mansouraty, Netty Starring., MD  tamsulosin (FLOMAX) 0.4 MG CAPS capsule Take 1 capsule (0.4 mg total) by mouth daily after supper. 01/06/24   Danford, Earl Lites, MD  TOUJEO SOLOSTAR 300 UNIT/ML Solostar Pen Inject 25 Units into the skin at bedtime. 12/08/23   Azucena Fallen, MD  traMADol Janean Sark) 50 MG tablet 1 to 2 tablets every 6 hours with extra strength Tylenol for pain as needed 12/12/23   Arby Barrette, MD  traZODone (DESYREL) 50 MG tablet Take 50 mg by mouth at bedtime as needed for sleep.    [provider]      Allergies    Codeine    Review of Systems   Review of Systems  Gastrointestinal:  Positive for abdominal pain.    Physical Exam Updated Vital Signs BP (!) 136/99 (BP Location: Left Arm)   Pulse 89   Temp (!) 97.4 F (36.3 [name]) (Oral)   Resp 16   LMP 08/11/2012   SpO2 99%  Physical Exam Vitals and nursing note reviewed.  Constitutional:      General: She is not in acute distress.    Appearance: She is well-developed.  HENT:     Head: Normocephalic and atraumatic.  Eyes:     Conjunctiva/sclera: Conjunctivae normal.  Cardiovascular:     Rate and Rhythm: Normal rate and regular rhythm.     Heart sounds: No murmur heard. Pulmonary:     Effort: Pulmonary effort is normal. No respiratory distress.     Breath sounds: Normal breath sounds.  Abdominal:     Palpations: Abdomen is soft.     Tenderness: There is generalized abdominal tenderness. There is right  CVA tenderness and left CVA tenderness.  Musculoskeletal:        General: No swelling.     Cervical back: Neck supple.  Skin:    General: Skin is warm and dry.     Capillary Refill: Capillary refill takes less than 2 seconds.  Neurological:     Mental Status: She is alert.  Psychiatric:        Mood and Affect: Mood normal.     ED Results / Procedures / Treatments   Labs (all labs ordered are listed, but only abnormal results are displayed) Labs Reviewed  COMPREHENSIVE METABOLIC  PANEL - Abnormal; Notable for the following components:      Result Value   Sodium 133 (*)    Glucose, Bld 233 (*)    AST 12 (*)    All other components within normal limits  CBC WITH DIFFERENTIAL/PLATELET - Abnormal; Notable for the following components:   WBC 11.7 (*)    RBC 5.23 (*)    Abs Immature Granulocytes 0.11 (*)    All other components within normal limits  URINALYSIS, W/ REFLEX TO CULTURE (INFECTION SUSPECTED) - Abnormal; Notable for the following components:   APPearance CLOUDY (*)    Glucose, UA >=500 (*)    Hgb urine dipstick LARGE (*)    Ketones, ur 20 (*)    Protein, ur 100 (*)    Leukocytes,Ua SMALL (*)    Bacteria, UA MANY (*)    All other components within normal limits  CBG MONITORING, ED - Abnormal; Notable for the following components:   Glucose-Capillary 228 (*)    All other components within normal limits  URINE CULTURE  CULTURE, BLOOD (ROUTINE X 2)  CULTURE, BLOOD (ROUTINE X 2)  LACTIC ACID, PLASMA  LACTIC ACID, PLASMA    EKG None  Radiology CT ABDOMEN PELVIS W CONTRAST Result Date: 01/11/2024 CLINICAL DATA:  Abdominal pain and hematuria. Pyelonephritis suspected. EXAM: CT ABDOMEN AND PELVIS WITH CONTRAST TECHNIQUE: Multidetector CT imaging of the abdomen and pelvis was performed using the standard protocol following bolus administration of intravenous contrast. RADIATION DOSE REDUCTION: This exam was performed according to the departmental dose-optimization program  which includes automated exposure control, adjustment of the mA and/or kV according to patient size and/or use of iterative reconstruction technique. CONTRAST:  OMNIPAQUE IOHEXOL 300 MG/ML  SOLN COMPARISON:  01/10/2024 FINDINGS: Lower chest: Clear lung bases. Normal heart size without pericardial or pleural effusion. Hepatobiliary: Focal steatosis adjacent the falciform ligament. Hepatomegaly versus a Riedel's lobe at 20.5 cm. Normal gallbladder, without biliary ductal dilatation. Pancreas: Normal, without mass or ductal dilatation. Spleen: Normal in size, without focal abnormality. Adrenals/Urinary Tract: Normal adrenal glands. Normal kidneys, without hydronephrosis. There is marked bladder wall thickening. Circumferential gas about the periphery of the bladder lumen is favored to be mucosal. There is small volume intraluminal gas as well. Example 65/2. Stomach/Bowel: Gastric antral underdistention. Apparent wall thickening is likely secondary. Normal colon, appendix, and terminal ileum. Normal small bowel. Vascular/Lymphatic: Aortic atherosclerosis. No abdominopelvic adenopathy. Reproductive: Hysterectomy.  No adnexal mass. Other: No significant free fluid. No extracystic gas. No free intraperitoneal air. Small fat containing ventral abdominal wall hernia. Musculoskeletal: Transitional S1 vertebral body. Degenerative disc disease at L4-5. IMPRESSION: 1. Marked bladder wall thickening with intraluminal and intramural gas, most consistent with emphysematous cystitis. 2.  Aortic Atherosclerosis (ICD10-I70.0). Electronically Signed   By: Jeronimo Greaves M.D.   On: 01/11/2024 15:50   CT ABDOMEN PELVIS WO CONTRAST Result Date: 01/10/2024 CLINICAL DATA:  Three day history of abdominal pain and nausea EXAM: CT ABDOMEN AND PELVIS WITHOUT CONTRAST TECHNIQUE: Multidetector CT imaging of the abdomen and pelvis was performed following the standard protocol without IV contrast. RADIATION DOSE REDUCTION: This exam was  performed according to the departmental dose-optimization program which includes automated exposure control, adjustment of the mA and/or kV according to patient size and/or use of iterative reconstruction technique. COMPARISON:  CT abdomen and pelvis dated 11/11/2023 FINDINGS: Lower chest: No focal consolidation or pulmonary nodule in the lung bases. No pleural effusion or pneumothorax demonstrated. Partially imaged heart size is normal. Hepatobiliary: No focal hepatic  lesions. No intra or extrahepatic biliary ductal dilation. Normal gallbladder. Pancreas: No focal lesions or main ductal dilation. Spleen: Normal in size without focal abnormality. Adrenals/Urinary Tract: No adrenal nodules. No suspicious renal mass on this noncontrast enhanced examination, calculi or hydronephrosis. Moderately distended urinary bladder demonstrates mild circumferential mural thickening. Stomach/Bowel: Mild mural thickening of the partially imaged distal esophagus. Normal appearance of the stomach. No evidence of bowel wall thickening, distention, or inflammatory changes. Colonic diverticulosis without acute diverticulitis. Normal appendix. Vascular/Lymphatic: Aortic atherosclerosis. No enlarged abdominal or pelvic lymph nodes. Reproductive: No adnexal masses. Other: No free fluid, fluid collection, or free air. Musculoskeletal: No acute or abnormal lytic or blastic osseous lesions. Small fat-containing paraumbilical hernia. IMPRESSION: 1. Mild circumferential mural thickening of the partially imaged distal esophagus, which can be seen in the setting of esophagitis. 2. Moderately distended urinary bladder demonstrates mild circumferential mural thickening, which may be due to cystitis. Recommend correlation with urinalysis and urinary retention. 3. Colonic diverticulosis without acute diverticulitis. 4.  Aortic Atherosclerosis (ICD10-I70.0). Electronically Signed   By: Agustin Cree M.D.   On: 01/10/2024 10:26   DG ABD ACUTE 2+V W 1V  CHEST Result Date: 01/10/2024 CLINICAL DATA:  One-week history of constipation and nausea EXAM: DG ABDOMEN ACUTE WITH 1 VIEW CHEST COMPARISON:  CT abdomen and pelvis dated 11/11/2023 FINDINGS: Lines/tubes: None. Chest: Lungs are clear without focal consolidation. No pneumothorax or pleural effusion. Normal heart size. Abdomen: Nonobstructive bowel gas pattern. No pneumatosis or free air. Moderate volume stool within the colon. No abnormal calcification. Pelvic soft tissue density, likely corresponding to distended urinary bladder. Bones: No acute osseous abnormality. IMPRESSION: 1. Nonobstructive bowel gas pattern. Moderate volume stool within the colon. 2. Pelvic soft tissue density, likely corresponding to distended urinary bladder. Recommend correlation with urinary retention. Electronically Signed   By: Agustin Cree M.D.   On: 01/10/2024 09:58    Procedures Procedures    Medications Ordered in ED Medications  cefTRIAXone (ROCEPHIN) 2 g in sodium chloride 0.9 % 100 mL IVPB (has no administration in time range)  insulin aspart (novoLOG) injection 0-15 Units (has no administration in time range)  fentaNYL (SUBLIMAZE) injection 50 mcg (has no administration in time range)  fluconazole (DIFLUCAN) tablet 150 mg (has no administration in time range)  ondansetron (ZOFRAN) injection 4 mg (4 mg Intravenous Given 01/11/24 1337)  ketorolac (TORADOL) 15 MG/ML injection 15 mg (15 mg Intravenous Given 01/11/24 1337)  iohexol (OMNIPAQUE) 300 MG/ML solution 100 mL (100 mLs Intravenous Contrast Given 01/11/24 1511)    ED Course/ Medical Decision Making/ A&P                                 Medical Decision Making Amount and/or Complexity of Data Reviewed Radiology: ordered.   This patient presents to the ED with chief complaint(s) of hematuria and abdominal pain.  The complaint involves an extensive differential diagnosis and also carries with it a high risk of complications and morbidity.   pertinent past  medical history as listed in HPI  The differential diagnosis includes  gastroenteritis, colitis, small bowel obstruction, appendicitis, cholecystitis, hepatobiliary pathology, gastritis, PUD, ACS, aortic dissection, diverticulosis/diverticulitis, pancreatitis, nephrolithiasis, AAA, UTI, pyelonephritis, The initial plan is to  Will obtain basic labs and urine Additional history obtained:  Records reviewed previous admission documents and Care Everywhere/External Records  Initial Assessment:   Hemodynamically stable, afebrile, nontoxic-appearing patient presenting with hematuria, abdominal pain and flank pain.  Diagnosed with UTI yesterday, however unable to pick up antibiotics.  Flank pain started last night with hematuria this morning.  On exam she has generalized abdominal tenderness with bilateral CVAT.  Her urine is notable for large amount of hemoglobin, negative nitrates, small amount of leukocytes and budding yeast present.  Primary differential includes includes uncomplicated cystitis with hematuria versus pyelonephritis versus nephrolithiasis.  Patient has no history of kidney stones.  Given that she is diabetic, 63 years old with new onset of flank pain and hematuria in the setting of UTI will obtain CT to rule out pyelonephritis.  Independent ECG interpretation:  None  Independent labs interpretation:  The following labs were independently interpreted:  CBC with leukocytosis of 11.7, UA cloudy appearance with large amount of glucose, large amount hemoglobin, negative nitrates, small leukocytes, many bacteria, budding yeast present, mild hyponatremia on CMP, elevated glucose to 233, anion gap within normal limits  Independent visualization and interpretation of imaging: I independently visualized the following imaging with scope of interpretation limited to determining acute life threatening conditions related to emergency care: CT abdomen pelvis, marked bladder wall thickening with  intraluminal and intramural gas  Treatment and Reassessment: Patient given Toradol 15 mg IV and Zofran 4 mg IV following first assessment  Upon reassessment patient with persistent pain.  She has allergies to codeine.  Reports good pain relief with fentanyl in the past.  Will give a dose  Consultations obtained:   Urology Dr. Corinna Gab Recommended inserting a Foley, starting broad antibiotics, fluconazole and admitted to medicine Dr. 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  Despite best efforts at proofreading, errors may have occurred which can change the documentation meaning.          Final Clinical Impression(s) / ED Diagnoses Final diagnoses:  Emphysematous cystitis    Rx / DC Orders ED Discharge Orders     None         Fabienne Bruns 01/11/24 1848    Terald Sleeper, MD 01/12/24 [phone]

## 2024-01-11 NOTE — Assessment & Plan Note (Addendum)
Continue synthroid  TSH remains elevated at 16 (6 days ago)  Has f/u with endocrinology next week

## 2024-01-11 NOTE — Assessment & Plan Note (Signed)
A1C of 12.1 in 11/2023 Continue long acting insulin  Moderate SSI and accuchecks QAC/HS

## 2024-01-11 NOTE — Assessment & Plan Note (Signed)
63 year old female presenting to ED with abdominal pain, dysuria, hematuria found to have UTI and findings on CT of emphysematous cystitis.  -obs to med surg  -urology consulted -recommended foley be placed and 14 days of antibiotics  -no signs of sepsis, lactic acid wnl -f/u on urine culture from yesterday -BC pending  -continue rocephin -gave dose of fluconazole for yeast on UA  -pain medication, encouraged oral intake of fluids

## 2024-01-11 NOTE — Assessment & Plan Note (Signed)
Continue crestor and lovaza

## 2024-01-11 NOTE — Consult Note (Signed)
Urology Consult Note   Requesting Attending Physician:  Wynetta Fines, MD Service Providing Consult: Urology  Consulting Attending: Dr Jerilee Field    Reason for Consult: Status  HPI: [name] [name] is seen in consultation for reasons noted above at the request of Wynetta Fines, MD emphysematous cystitis.  This is a 63 y.o. female with CHF, diabetes type 1, DKA, GERD, hyperlipidemia, hypertension who presents to the ED dysuria, hematuria and flank pain.  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  UA concerning for infection as well as fungus.  She reports constipation yesterday. Went to ED and was diagnosed with a UTI. She then had severe abdominal pain in the suprapubic region as well as in the lower flank. Had an episode of hematuria. Endorses nausea.   Denies urgency, frequency, and fever.   She has not seen a urologist but has had incomplete emptying in the past. She has not felt like she was poorly emptying her bladder at this time.   She was given antibiotics for home yesterday but did not start them as the pharmacy was closed.   Past Medical History: Past Medical History:  Diagnosis Date   Allergy    seasonal   Anxiety    B12 deficiency    Chest pain 04/05/2017   CHF (congestive heart failure) ([name])    Diabetes mellitus type 1, uncontrolled    "dr. 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) 10/29/2013   Leg cramping    "@ night" (09/18/2013)   Migraine    "once q couple months" (11/26/2018)   Osteoporosis    Peripheral neuropathy    Umbilical hernia    unrepaired (09/18/2013)    Past Surgical History:  Past Surgical History:  Procedure Laterality Date   BIOPSY   06/03/2019   Procedure: BIOPSY;  Surgeon: Lynann Bologna, MD;  Location: [facility];  Service: Endoscopy;;   BIOPSY  08/17/2020   Procedure: BIOPSY;  Surgeon: Tressia Danas, MD;  Location: [facility];  Service: Gastroenterology;;   BIOPSY  02/05/2023   Procedure: BIOPSY;  Surgeon: Meryl Dare, MD;  Location: [facility];  Service: Gastroenterology;;   BIOPSY  09/24/2023   Procedure: BIOPSY;  Surgeon: Lemar Lofty., MD;  Location: [facility];  Service: Gastroenterology;;   CESAREAN SECTION  1989   ENTEROSCOPY N/A 06/03/2019   Procedure: ENTEROSCOPY;  Surgeon: Lynann Bologna, MD;  Location: [facility];  Service: Endoscopy;  Laterality: N/A;   ESOPHAGOGASTRODUODENOSCOPY (EGD) WITH PROPOFOL N/A 08/17/2020   Procedure: ESOPHAGOGASTRODUODENOSCOPY (EGD) WITH PROPOFOL;  Surgeon: Tressia Danas, MD;  Location: [facility];  Service: Gastroenterology;  Laterality: N/A;   ESOPHAGOGASTRODUODENOSCOPY (EGD) WITH PROPOFOL N/A 02/05/2023   Procedure: ESOPHAGOGASTRODUODENOSCOPY (EGD) WITH PROPOFOL;  Surgeon: Meryl Dare, MD;  Location: [facility];  Service: Gastroenterology;  Laterality: N/A;   ESOPHAGOGASTRODUODENOSCOPY (EGD) WITH PROPOFOL N/A 09/24/2023   Procedure: ESOPHAGOGASTRODUODENOSCOPY (EGD) WITH PROPOFOL;  Surgeon: Meridee Score Netty Starring., MD;  Location: [facility];  Service: Gastroenterology;  Laterality: N/A;   EUS N/A 09/24/2023   Procedure: UPPER ENDOSCOPIC ULTRASOUND (EUS) LINEAR;  Surgeon: Lemar Lofty., MD;  Location: [facility];  Service: Gastroenterology;  Laterality: N/A;   LAPAROSCOPIC ASSISTED VAGINAL HYSTERECTOMY  09/23/2012  Procedure: LAPAROSCOPIC ASSISTED VAGINAL HYSTERECTOMY;  Surgeon: Mitchel Honour, DO;  Location: [facility];  Service: Gynecology;  Laterality: N/A;  pull Dr Vance Gather instrument   LEFT HEART CATH AND CORONARY ANGIOGRAPHY N/A 02/11/2017   Procedure: Left Heart Cath and Coronary Angiography;  Surgeon: Runell Gess, MD;  Location: [facility]  INVASIVE CV LAB;  Service: Cardiovascular;  Laterality: N/A;   TUBAL LIGATION Bilateral 1992    Medication: No current facility-administered medications for this encounter.   Current Outpatient Medications  Medication Sig Dispense Refill   acetaminophen (TYLENOL) 500 MG tablet Take 1,000 mg by mouth every 6 (six) hours as needed for moderate pain (pain score 4-6).     aspirin EC 81 MG tablet Take 81 mg by mouth daily. Swallow whole.     azelastine (ASTELIN) 0.1 % nasal spray Place 1 spray into both nostrils as needed for rhinitis or allergies.     cephALEXin (KEFLEX) 500 MG capsule Take 1 capsule (500 mg total) by mouth 4 (four) times daily. 20 capsule 0   Continuous Blood Gluc Transmit (DEXCOM G6 TRANSMITTER) MISC USE AS DIRECTED FOR CONTINUOUS GLUCOSE MONITORING. REUSE TRANSMITTER X 90DAYS THEN DISCARD & REPLACE 1 each 3   escitalopram (LEXAPRO) 10 MG tablet Take 1 tablet (10 mg total) by mouth daily. 30 tablet 1   Glucagon, rDNA, (GLUCAGON EMERGENCY) 1 MG KIT Inject 1 mg into the skin See admin instructions. Inject one mg into the skin see administration instructions. Follow package directions for low blood sugar.     ipratropium (ATROVENT) 0.03 % nasal spray Place into both nostrils.     levothyroxine (SYNTHROID) 137 MCG tablet Take 137 mcg by mouth daily before breakfast.     metoCLOPramide (REGLAN) 5 MG tablet Take 1 tablet (5 mg total) by mouth 3 (three) times daily before meals. 90 tablet 2   NOVOLOG FLEXPEN 100 UNIT/ML FlexPen Inject 10 Units into the skin 3 (three) times daily with meals. Plus sliding scale TID 15 mL 0   omega-3 acid ethyl esters (LOVAZA) 1 g capsule Take 2 capsules (2 g total) by mouth 2 (two) times daily. 120 capsule 4   ondansetron (ZOFRAN) 4 MG tablet Take 2 tablets (8 mg total) by mouth every 8 (eight) hours as needed for nausea or vomiting. 30 tablet 2   ondansetron (ZOFRAN-ODT) 4 MG disintegrating tablet Take 1 tablet (4 mg total) by mouth every 4 (four) hours  as needed. 20 tablet 0   ondansetron (ZOFRAN-ODT) 8 MG disintegrating tablet Take 1 tablet (8 mg total) by mouth every 8 (eight) hours as needed for nausea or vomiting. 20 tablet 0   pantoprazole (PROTONIX) 40 MG tablet Take 1 tablet (40 mg total) by mouth 2 (two) times daily. 60 tablet 6   QUEtiapine (SEROQUEL) 50 MG tablet Take 50 mg by mouth at bedtime.     rosuvastatin (CRESTOR) 10 MG tablet Take 1 tablet (10 mg total) by mouth daily. 30 tablet 1   sucralfate (CARAFATE) 1 g tablet Take 1 tablet (1 g total) by mouth 2 (two) times daily. Do not take any other medication within 2-4 hours. 60 tablet 1   tamsulosin (FLOMAX) 0.4 MG CAPS capsule Take 1 capsule (0.4 mg total) by mouth daily after supper. 30 capsule 6   TOUJEO SOLOSTAR 300 UNIT/ML Solostar Pen Inject 25 Units into the skin at bedtime. 15 mL 1   traMADol (ULTRAM) 50 MG tablet 1 to 2 tablets every 6 hours with extra strength Tylenol for pain as needed  20 tablet 0   traZODone (DESYREL) 50 MG tablet Take 50 mg by mouth at bedtime as needed for sleep.      Allergies: Allergies  Allergen Reactions   Codeine Hives, Anxiety and Other (See Comments)    Social History: Social History   Tobacco Use   Smoking status: Former    Current packs/day: 0.00    Average packs/day: 0.1 packs/day for 10.0 years (1.2 ttl pk-yrs)    Types: Cigarettes    Start date: 12/10/1980    Quit date: 12/10/1990    Years since quitting: 33.1   Smokeless tobacco: Never  Vaping Use   Vaping status: Never Used  Substance Use Topics   Alcohol use: Never    Alcohol/week: 0.0 standard drinks of alcohol   Drug use: Not Currently    Types: Marijuana    Comment: last use 2 days ago    Family History Family History  Problem Relation Age of Onset   Heart failure Mother 19       Her heart skipped a beat and stopped - per pt   Heart failure Brother    Diabetes Brother    Diabetes Maternal Grandmother    Alzheimer's disease Maternal Grandmother    Coronary  artery disease Maternal Grandmother    Heart attack Maternal Grandfather    Diabetes Other    Heart disease Other    Colon polyps Neg Hx    Esophageal cancer Neg Hx    Liver cancer Neg Hx    Pancreatic cancer Neg Hx    Stomach cancer Neg Hx    Crohn's disease Neg Hx    Rectal cancer Neg Hx    Colon cancer Neg Hx     Review of Systems 10 systems were reviewed and are negative except as noted specifically in the HPI.  Objective   Vital signs in last 24 hours: BP (!) 136/99 (BP Location: Left Arm)   Pulse 89   Temp (!) 97.4 F (36.3 C) (Oral)   Resp 16   LMP 08/11/2012   SpO2 99%   Physical Exam General: NAD HEENT: [place]/AT, EOMI, MMM Pulmonary: Normal work of breathing Cardiovascular: HDS, adequate peripheral perfusion Abdomen: Soft, nondistended, .tender in suprapubic region.  GU: mild CVA tenderness Extremities: warm and well perfused Neuro: Appropriate, no focal neurological deficits  Most Recent Labs: Lab Results  Component Value Date   WBC 11.7 (H) 01/11/2024   HGB 14.8 01/11/2024   HCT 44.8 01/11/2024   PLT 390 01/11/2024    Lab Results  Component Value Date   NA 133 (L) 01/11/2024   K 3.9 01/11/2024   CL 98 01/11/2024   CO2 23 01/11/2024   BUN 20 01/11/2024   CREATININE 0.64 01/11/2024   CALCIUM 9.0 01/11/2024   MG 2.6 (H) 11/16/2023   PHOS 2.9 10/28/2023    Lab Results  Component Value Date   INR 1.0 01/06/2024   APTT 25 10/26/2023     Urine Culture: @[id number] (laburin,org,[id number])@   IMAGING: CT ABDOMEN PELVIS W CONTRAST Result Date: 01/11/2024 CLINICAL DATA:  Abdominal pain and hematuria. Pyelonephritis suspected. EXAM: CT ABDOMEN AND PELVIS WITH CONTRAST TECHNIQUE: Multidetector CT imaging of the abdomen and pelvis was performed using the standard protocol following bolus administration of intravenous contrast. RADIATION DOSE REDUCTION: This exam was performed according to the departmental dose-optimization program which includes  automated exposure control, adjustment of the mA and/or kV according to patient size and/or use of iterative reconstruction technique. CONTRAST:  OMNIPAQUE IOHEXOL 300  MG/ML  SOLN COMPARISON:  01/10/2024 FINDINGS: Lower chest: Clear lung bases. Normal heart size without pericardial or pleural effusion. Hepatobiliary: Focal steatosis adjacent the falciform ligament. Hepatomegaly versus a Riedel's lobe at 20.5 cm. Normal gallbladder, without biliary ductal dilatation. Pancreas: Normal, without mass or ductal dilatation. Spleen: Normal in size, without focal abnormality. Adrenals/Urinary Tract: Normal adrenal glands. Normal kidneys, without hydronephrosis. There is marked bladder wall thickening. Circumferential gas about the periphery of the bladder lumen is favored to be mucosal. There is small volume intraluminal gas as well. Example 65/2. Stomach/Bowel: Gastric antral underdistention. Apparent wall thickening is likely secondary. Normal colon, appendix, and terminal ileum. Normal small bowel. Vascular/Lymphatic: Aortic atherosclerosis. No abdominopelvic adenopathy. Reproductive: Hysterectomy.  No adnexal mass. Other: No significant free fluid. No extracystic gas. No free intraperitoneal air. Small fat containing ventral abdominal wall hernia. Musculoskeletal: Transitional S1 vertebral body. Degenerative disc disease at L4-5. IMPRESSION: 1. Marked bladder wall thickening with intraluminal and intramural gas, most consistent with emphysematous cystitis. 2.  Aortic Atherosclerosis (ICD10-I70.0). Electronically Signed   By: Jeronimo Greaves M.D.   On: 01/11/2024 15:50   CT ABDOMEN PELVIS WO CONTRAST Result Date: 01/10/2024 CLINICAL DATA:  Three day history of abdominal pain and nausea EXAM: CT ABDOMEN AND PELVIS WITHOUT CONTRAST TECHNIQUE: Multidetector CT imaging of the abdomen and pelvis was performed following the standard protocol without IV contrast. RADIATION DOSE REDUCTION: This exam was performed according  to the departmental dose-optimization program which includes automated exposure control, adjustment of the mA and/or kV according to patient size and/or use of iterative reconstruction technique. COMPARISON:  CT abdomen and pelvis dated 11/11/2023 FINDINGS: Lower chest: No focal consolidation or pulmonary nodule in the lung bases. No pleural effusion or pneumothorax demonstrated. Partially imaged heart size is normal. Hepatobiliary: No focal hepatic lesions. No intra or extrahepatic biliary ductal dilation. Normal gallbladder. Pancreas: No focal lesions or main ductal dilation. Spleen: Normal in size without focal abnormality. Adrenals/Urinary Tract: No adrenal nodules. No suspicious renal mass on this noncontrast enhanced examination, calculi or hydronephrosis. Moderately distended urinary bladder demonstrates mild circumferential mural thickening. Stomach/Bowel: Mild mural thickening of the partially imaged distal esophagus. Normal appearance of the stomach. No evidence of bowel wall thickening, distention, or inflammatory changes. Colonic diverticulosis without acute diverticulitis. Normal appendix. Vascular/Lymphatic: Aortic atherosclerosis. No enlarged abdominal or pelvic lymph nodes. Reproductive: No adnexal masses. Other: No free fluid, fluid collection, or free air. Musculoskeletal: No acute or abnormal lytic or blastic osseous lesions. Small fat-containing paraumbilical hernia. IMPRESSION: 1. Mild circumferential mural thickening of the partially imaged distal esophagus, which can be seen in the setting of esophagitis. 2. Moderately distended urinary bladder demonstrates mild circumferential mural thickening, which may be due to cystitis. Recommend correlation with urinalysis and urinary retention. 3. Colonic diverticulosis without acute diverticulitis. 4.  Aortic Atherosclerosis (ICD10-I70.0). Electronically Signed   By: Agustin Cree M.D.   On: 01/10/2024 10:26   DG ABD ACUTE 2+V W 1V CHEST Result Date:  01/10/2024 CLINICAL DATA:  One-week history of constipation and nausea EXAM: DG ABDOMEN ACUTE WITH 1 VIEW CHEST COMPARISON:  CT abdomen and pelvis dated 11/11/2023 FINDINGS: Lines/tubes: None. Chest: Lungs are clear without focal consolidation. No pneumothorax or pleural effusion. Normal heart size. Abdomen: Nonobstructive bowel gas pattern. No pneumatosis or free air. Moderate volume stool within the colon. No abnormal calcification. Pelvic soft tissue density, likely corresponding to distended urinary bladder. Bones: No acute osseous abnormality. IMPRESSION: 1. Nonobstructive bowel gas pattern. Moderate volume stool within the colon. 2. Pelvic  soft tissue density, likely corresponding to distended urinary bladder. Recommend correlation with urinary retention. Electronically Signed   By: Agustin Cree M.D.   On: 01/10/2024 09:58    ------  Assessment:  63 y.o. 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  Patient is currently afebrile and hemodynamic stable.  Labs with small leukocytosis of 11.7.  Creatinine 0.69 which at her baseline.  UA concerning for infection as well as fungus.  Urine culture and blood cultures pending.  CT with no evidence of hydronephrosis however there was evidence of emphysematous cystitis.  The recommend treatment of this is conservative manage with IV antibiotics.  Follow-up urine cultures and treat for complicated UTI for total course of 14 days.  Would recommend placement of a Foley catheter to drain infected urine.   Recommendations: -Please place Foley catheter -Will treat patient with broad-spectrum antibiotics as well as fluconazole given yeast in the urine. -Follow-up blood and urine cultures. Follow up Ucx form 1/31. Once urine culture results, treat based on sensitivities for total course of 14 days.  Jerald Kief, MD, PhD [facility] Resident  [facility]     Thank you for this consult. Please contact the urology consult  pager with any further questions/concerns.

## 2024-01-11 NOTE — Assessment & Plan Note (Signed)
Euvolemic Daily weights Echo 12/2020: normal EF, grade 1 DD

## 2024-01-11 NOTE — Assessment & Plan Note (Signed)
Creatinine normal, GFR >60 Stable, continue to monitor

## 2024-01-11 NOTE — Assessment & Plan Note (Signed)
Continue reglan, protonix and carafate

## 2024-01-12 DIAGNOSIS — N3080 Other cystitis without hematuria: Secondary | ICD-10-CM | POA: Diagnosis present

## 2024-01-12 DIAGNOSIS — I10 Essential (primary) hypertension: Secondary | ICD-10-CM | POA: Diagnosis not present

## 2024-01-12 DIAGNOSIS — Z79899 Other long term (current) drug therapy: Secondary | ICD-10-CM | POA: Diagnosis not present

## 2024-01-12 DIAGNOSIS — K5900 Constipation, unspecified: Secondary | ICD-10-CM | POA: Diagnosis present

## 2024-01-12 DIAGNOSIS — E785 Hyperlipidemia, unspecified: Secondary | ICD-10-CM | POA: Diagnosis present

## 2024-01-12 DIAGNOSIS — N319 Neuromuscular dysfunction of bladder, unspecified: Secondary | ICD-10-CM | POA: Diagnosis present

## 2024-01-12 DIAGNOSIS — I130 Hypertensive heart and chronic kidney disease with heart failure and stage 1 through stage 4 chronic kidney disease, or unspecified chronic kidney disease: Secondary | ICD-10-CM | POA: Diagnosis present

## 2024-01-12 DIAGNOSIS — E1065 Type 1 diabetes mellitus with hyperglycemia: Secondary | ICD-10-CM

## 2024-01-12 DIAGNOSIS — Z87891 Personal history of nicotine dependence: Secondary | ICD-10-CM | POA: Diagnosis not present

## 2024-01-12 DIAGNOSIS — B9620 Unspecified Escherichia coli [E. coli] as the cause of diseases classified elsewhere: Secondary | ICD-10-CM | POA: Diagnosis present

## 2024-01-12 DIAGNOSIS — F419 Anxiety disorder, unspecified: Secondary | ICD-10-CM | POA: Diagnosis present

## 2024-01-12 DIAGNOSIS — K219 Gastro-esophageal reflux disease without esophagitis: Secondary | ICD-10-CM | POA: Diagnosis present

## 2024-01-12 DIAGNOSIS — Z7982 Long term (current) use of aspirin: Secondary | ICD-10-CM | POA: Diagnosis not present

## 2024-01-12 DIAGNOSIS — E1042 Type 1 diabetes mellitus with diabetic polyneuropathy: Secondary | ICD-10-CM | POA: Diagnosis present

## 2024-01-12 DIAGNOSIS — M810 Age-related osteoporosis without current pathological fracture: Secondary | ICD-10-CM | POA: Diagnosis present

## 2024-01-12 DIAGNOSIS — E1022 Type 1 diabetes mellitus with diabetic chronic kidney disease: Secondary | ICD-10-CM | POA: Diagnosis present

## 2024-01-12 DIAGNOSIS — I5032 Chronic diastolic (congestive) heart failure: Secondary | ICD-10-CM | POA: Diagnosis present

## 2024-01-12 DIAGNOSIS — F331 Major depressive disorder, recurrent, moderate: Secondary | ICD-10-CM | POA: Diagnosis present

## 2024-01-12 DIAGNOSIS — Z794 Long term (current) use of insulin: Secondary | ICD-10-CM | POA: Diagnosis not present

## 2024-01-12 DIAGNOSIS — N3081 Other cystitis with hematuria: Secondary | ICD-10-CM | POA: Diagnosis present

## 2024-01-12 DIAGNOSIS — Z79890 Hormone replacement therapy (postmenopausal): Secondary | ICD-10-CM | POA: Diagnosis not present

## 2024-01-12 DIAGNOSIS — Z8249 Family history of ischemic heart disease and other diseases of the circulatory system: Secondary | ICD-10-CM | POA: Diagnosis not present

## 2024-01-12 DIAGNOSIS — N182 Chronic kidney disease, stage 2 (mild): Secondary | ICD-10-CM | POA: Diagnosis present

## 2024-01-12 DIAGNOSIS — Z885 Allergy status to narcotic agent status: Secondary | ICD-10-CM | POA: Diagnosis not present

## 2024-01-12 DIAGNOSIS — E039 Hypothyroidism, unspecified: Secondary | ICD-10-CM | POA: Diagnosis present

## 2024-01-12 DIAGNOSIS — Z1152 Encounter for screening for COVID-19: Secondary | ICD-10-CM | POA: Diagnosis not present

## 2024-01-12 LAB — BASIC METABOLIC PANEL
Anion gap: 9 (ref 5–15)
BUN: 20 mg/dL (ref 6–20)
CO2: 22 mmol/L (ref 22–32)
Calcium: 8.3 mg/dL — ABNORMAL LOW (ref 8.9–10.3)
Chloride: 102 mmol/L (ref 98–111)
Creatinine, Ser: 0.58 mg/dL (ref 0.44–1.00)
GFR, Estimated: >60 mL/min (ref >60)
Glucose, Bld: 176 mg/dL — ABNORMAL HIGH (ref 70–99)
Potassium: 4 mmol/L (ref 3.5–5.1)
Sodium: 133 mmol/L — ABNORMAL LOW (ref 135–145)

## 2024-01-12 LAB — GLUCOSE, CAPILLARY
Glucose-Capillary: 227 mg/dL — ABNORMAL HIGH (ref 70–99)
Glucose-Capillary: 241 mg/dL — ABNORMAL HIGH (ref 70–99)
Glucose-Capillary: 61 mg/dL — ABNORMAL LOW (ref 70–99)
Glucose-Capillary: 96 mg/dL (ref 70–99)
Glucose-Capillary: 97 mg/dL (ref 70–99)

## 2024-01-12 LAB — CBC
HCT: 39.9 % (ref 36.0–46.0)
Hemoglobin: 13 g/dL (ref 12.0–15.0)
MCH: 28 pg (ref 26.0–34.0)
MCHC: 32.6 g/dL (ref 30.0–36.0)
MCV: 85.8 fL (ref 80.0–100.0)
Platelets: 318 10*3/uL (ref 150–400)
RBC: 4.65 MIL/uL (ref 3.87–5.11)
RDW: 15.3 % (ref 11.5–15.5)
WBC: 10.5 10*3/uL (ref 4.0–10.5)
nRBC: 0 % (ref 0.0–0.2)

## 2024-01-12 MED ORDER — DEXTROSE 50 % IV SOLN
12.5000 g | INTRAVENOUS | Status: AC
  Filled 2024-01-12: qty 50

## 2024-01-12 MED ORDER — ASPIRIN 81 MG PO TBEC
81.0000 mg | DELAYED_RELEASE_TABLET | Freq: Every day | ORAL | Status: DC
  Administered 2024-01-12 – 2024-01-14 (×3): 81 mg via ORAL
  Filled 2024-01-12 (×3): qty 1

## 2024-01-12 MED ORDER — INSULIN GLARGINE-YFGN 100 UNIT/ML ~~LOC~~ SOLN
15.0000 [IU] | Freq: Two times a day (BID) | SUBCUTANEOUS | Status: DC
  Administered 2024-01-12 – 2024-01-14 (×3): 15 [IU] via SUBCUTANEOUS
  Filled 2024-01-12 (×6): qty 0.15

## 2024-01-12 MED ORDER — INSULIN ASPART 100 UNIT/ML IJ SOLN: 0.0000 [IU] | Freq: Every day | INTRAMUSCULAR | Status: DC

## 2024-01-12 MED ORDER — QUETIAPINE FUMARATE 50 MG PO TABS
50.0000 mg | ORAL_TABLET | Freq: Every day | ORAL | Status: DC
  Administered 2024-01-12 – 2024-01-13 (×2): 50 mg via ORAL
  Filled 2024-01-12 (×2): qty 1

## 2024-01-12 MED ORDER — LEVOTHYROXINE SODIUM 25 MCG PO TABS
137.0000 ug | ORAL_TABLET | Freq: Every day | ORAL | Status: DC
  Administered 2024-01-13 – 2024-01-14 (×2): 137 ug via ORAL
  Filled 2024-01-12 (×3): qty 1

## 2024-01-12 MED ORDER — PANTOPRAZOLE SODIUM 40 MG PO TBEC
40.0000 mg | DELAYED_RELEASE_TABLET | Freq: Two times a day (BID) | ORAL | Status: DC
Start: 2024-01-12 — End: 2024-01-14
  Administered 2024-01-12 – 2024-01-14 (×5): 40 mg via ORAL
  Filled 2024-01-12 (×5): qty 1

## 2024-01-12 MED ORDER — METOCLOPRAMIDE HCL 5 MG PO TABS
5.0000 mg | ORAL_TABLET | Freq: Three times a day (TID) | ORAL | Status: DC
  Administered 2024-01-12 – 2024-01-14 (×7): 5 mg via ORAL
  Filled 2024-01-12 (×8): qty 1

## 2024-01-12 MED ORDER — ROSUVASTATIN CALCIUM 10 MG PO TABS
10.0000 mg | ORAL_TABLET | Freq: Every day | ORAL | Status: DC
  Administered 2024-01-12 – 2024-01-14 (×3): 10 mg via ORAL
  Filled 2024-01-12 (×3): qty 1

## 2024-01-12 MED ORDER — TAMSULOSIN HCL 0.4 MG PO CAPS
0.4000 mg | ORAL_CAPSULE | Freq: Every day | ORAL | Status: DC
  Administered 2024-01-12 – 2024-01-13 (×2): 0.4 mg via ORAL
  Filled 2024-01-12 (×2): qty 1

## 2024-01-12 MED ORDER — INSULIN ASPART 100 UNIT/ML IJ SOLN
0.0000 [IU] | Freq: Three times a day (TID) | INTRAMUSCULAR | Status: DC
  Administered 2024-01-12: 5 [IU] via SUBCUTANEOUS
  Administered 2024-01-13: 3 [IU] via SUBCUTANEOUS
  Administered 2024-01-13: 5 [IU] via SUBCUTANEOUS
  Administered 2024-01-14 (×2): 3 [IU] via SUBCUTANEOUS

## 2024-01-12 MED ORDER — INSULIN GLARGINE (1 UNIT DIAL) 300 UNIT/ML ~~LOC~~ SOPN: 25.0000 [IU] | PEN_INJECTOR | Freq: Every day | SUBCUTANEOUS | Status: DC

## 2024-01-12 MED ORDER — SUCRALFATE 1 G PO TABS
1.0000 g | ORAL_TABLET | Freq: Two times a day (BID) | ORAL | Status: DC
  Administered 2024-01-12 – 2024-01-14 (×5): 1 g via ORAL
  Filled 2024-01-12 (×5): qty 1

## 2024-01-12 MED ORDER — INSULIN GLARGINE-YFGN 100 UNIT/ML ~~LOC~~ SOLN: 25.0000 [IU] | Freq: Every day | SUBCUTANEOUS | Status: DC

## 2024-01-12 MED ORDER — INSULIN ASPART 100 UNIT/ML IJ SOLN
4.0000 [IU] | Freq: Three times a day (TID) | INTRAMUSCULAR | Status: DC
  Administered 2024-01-12 – 2024-01-14 (×6): 4 [IU] via SUBCUTANEOUS

## 2024-01-12 MED ORDER — ESCITALOPRAM OXALATE 20 MG PO TABS
10.0000 mg | ORAL_TABLET | Freq: Every day | ORAL | Status: DC
  Administered 2024-01-12 – 2024-01-14 (×3): 10 mg via ORAL
  Filled 2024-01-12 (×3): qty 1

## 2024-01-12 MED ORDER — OMEGA-3-ACID ETHYL ESTERS 1 G PO CAPS
2.0000 g | ORAL_CAPSULE | Freq: Two times a day (BID) | ORAL | Status: DC
Start: 2024-01-12 — End: 2024-01-14
  Administered 2024-01-12 – 2024-01-14 (×5): 2 g via ORAL
  Filled 2024-01-12 (×5): qty 2

## 2024-01-12 NOTE — Progress Notes (Signed)
[facility] PROGRESS NOTE    Progress Note  [name] [name]  ZOX:[id number] DOB: Aug 22, 1961 DOA: 01/11/2024 PCP: Everardo All, NP     Brief Narrative:   [name] [name] is an 63 y.o. 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 Urine cultures and blood cultures were sent.  Has remained afebrile leukocytosis resolved. Continue narcotics for pain control allowing diet. She is not septic septic, sepsis ruled out  Uncontrolled type 1 diabetes mellitus with hyperglycemia, with long-term current use of insulin  Has a last A1c of 12.1. She is tolerating her diet, blood glucose around 220. Will start her on long-acting per sliding scale.  Hypothyroidism Continue Synthroid.  Chronic diastolic dysfunction: Continue current home regimen no changes made.  Hyperlipidemia:  continue Crestor and Lovaza.  GERD: Continue Reglan, Protonix and Carafate.  Chronic kidney disease stage II: At baseline.  Major depressive disorder: Continue Lexapro.   DVT prophylaxis: lovenox Family Communication:none Status is: Observation The patient will require care spanning > 2 midnights and should be moved to inpatient because: Emphysematous cystitis    Code Status:     Code Status Orders  (From admission, onward)           Start     Ordered   01/11/24 1849  Full code  Continuous       Question:   By:  Answer:  Consent: discussion documented in EHR   01/11/24 1849           Code Status History     Date Active Date Inactive Code Status Order ID Comments User Context   01/05/2024 0809 01/06/2024 2037 Full Code [id number]  Bobette Mo, MD ED   12/07/2023 1330 12/08/2023 1619 Full Code [id number]  Briscoe Burns, MD ED   11/14/2023 1125 11/18/2023 1635 Full Code [id number]  Lajoyce Corners, NP ED   11/12/2023 0032 11/12/2023 1709 Full Code [id number]  Anselm Jungling, DO ED   10/25/2023 1819 10/28/2023 1434 Full Code [id number]  Nolberto Hanlon, MD ED   09/22/2023 0922 09/25/2023 2311 Full Code [id number]  [name] Gottron, MD ED   09/12/2023 0359 09/14/2023 1724 Full Code [id number]  Briscoe Deutscher, MD ED   09/02/2023 1551 09/04/2023 2316 Full Code [id number]  Nolberto Hanlon, MD ED   08/22/2023 2023 08/25/2023 1740 Full Code [id number]  Charlsie Quest, MD ED   08/01/2023 0903 08/03/2023 0000 Full Code [id number]  Olegario Messier, MD ED   06/14/2023 1522 06/17/2023 1341 Full Code [id number]  [name] Gottron, MD ED   05/14/2023 1659 05/16/2023 1837 Full Code [id number]  Orland Mustard, MD ED   04/19/2023 1442 04/21/2023 2059 Full Code [id number]  [name] Gottron, MD ED   02/02/2023 1659 02/06/2023 1828 Full Code [id number]  Alwyn Ren, MD ED   01/06/2023 1921 01/08/2023 1944 Full Code [id number]  Anselm Jungling, DO ED   03/27/2022 1948 03/29/2022 1959 Full Code [id number]  Teddy Spike, DO Inpatient   01/18/2022 1249 01/19/2022 1533 Full Code [id number]  Wynetta Fines, MD ED   08/16/2020 1031 08/17/2020 1553 Full Code [id number]  Jae Dire, MD ED   05/30/2019 0546 06/03/2019 2108 Full Code [id number]  Briscoe Deutscher, MD Inpatient   04/23/2019 1450 04/24/2019 1442 Full Code [id number]  Jonah Blue, MD ED   03/25/2019 1934 03/28/2019 0011 Full Code [id number]  [name] Chapel, MD Inpatient   03/25/2019 1934 03/25/2019 1934 Full Code [id number]  [name] Chapel, MD Inpatient   01/12/2019 1912 01/14/2019 1642 Full Code  [id number]  [name] Cory, MD ED   11/26/2018 1539 11/28/2018 1346 Full Code [id number]  Bobette Mo, MD ED   11/26/2018 1530 11/26/2018 1539 Full Code [id number]  Bobette Mo, MD ED   10/19/2018 1541 10/20/2018 2122 Full Code [id number]  [name] Catalina, MD Inpatient   09/22/2018 1645 09/23/2018 1510 Full Code [id number]  [name] Daniel., MD Inpatient   09/22/2018 1645 09/22/2018 1645 Full Code [id number]  [name] Daniel., MD Inpatient   09/11/2018 1452 09/12/2018 1621 Full Code [id number]  Jonah Blue, MD ED   08/04/2018 1038 08/05/2018 2012 Full Code [id number]  [name] Bender, NP ED   06/27/2018 1633 06/29/2018 1757 Full Code [id number]  [name] Gilding, MD ED   05/26/2018 0242 05/28/2018 2108 Full Code [id number]  [name] Clos, MD ED   03/06/2018 2107 03/08/2018 1752 Full Code [id number]  Briscoe Deutscher, MD ED   02/02/2018 1129 02/04/2018 1854 Full Code [id number]  [name] Monica, MD ED   12/11/2017 1232 12/14/2017 1658 Full Code [id number]  [name] Eke, PA-C ED   09/19/2017 0746 09/23/2017 1924 Full Code [id number]  [name] Eke, PA-C ED   09/17/2017 2203 09/18/2017 1940 Full Code [id number]  [name] Braun, MD ED   06/22/2017 1709 06/24/2017 1930 Full Code [id number]  [name] Hawks, DO Inpatient   06/03/2017 1626 06/05/2017 1955 Full Code [id number]  [name] Eke, PA-C ED   04/05/2017 1143 04/08/2017 2117 Full Code [id number]  [name] Bender, NP ED   03/22/2017 2120 03/23/2017 1831 Full Code [id number]  [name] Sam, MD Inpatient   02/09/2017 0958 02/11/2017 2302 Full Code [id number]  [name] Dar, NP ED   07/24/2016 1633 07/26/2016 2113 Full Code [id number]  [name] Rocks, MD ED   07/24/2016 1633 07/24/2016 1633 Full Code [id number]  [name] Rocks, MD ED   10/28/2013 2338 10/29/2013 1749 Full Code [id number]  [name] North, MD ED   09/18/2013 1649 09/20/2013 1637 Full Code [id number]  [name] Espy, MD Inpatient   09/23/2012 1135 09/25/2012  1753 Full Code [id number]  [name] Patrick, RN Inpatient         IV Access:   Peripheral IV   Procedures and diagnostic studies:   CT ABDOMEN PELVIS W CONTRAST Result Date: 01/11/2024 CLINICAL DATA:  Abdominal pain and hematuria. Pyelonephritis suspected. EXAM: CT ABDOMEN AND PELVIS WITH CONTRAST TECHNIQUE: Multidetector CT imaging of the abdomen and pelvis was performed using the standard protocol following bolus administration of intravenous contrast. RADIATION DOSE REDUCTION: This exam was performed according to the departmental dose-optimization program which includes automated exposure control, adjustment of the mA and/or kV according to patient size and/or use of iterative reconstruction technique. CONTRAST:  OMNIPAQUE IOHEXOL 300 MG/ML  SOLN COMPARISON:  01/10/2024 FINDINGS: Lower chest: Clear lung bases. Normal heart size without pericardial or pleural effusion. Hepatobiliary: Focal  steatosis adjacent the falciform ligament. Hepatomegaly versus a Riedel's lobe at 20.5 cm. Normal gallbladder, without biliary ductal dilatation. Pancreas: Normal, without mass or ductal dilatation. Spleen: Normal in size, without focal abnormality. Adrenals/Urinary Tract: Normal adrenal glands. Normal kidneys, without hydronephrosis. There is marked bladder wall thickening. Circumferential gas about the periphery of the bladder lumen is favored to be mucosal. There is small volume intraluminal gas as well. Example 65/2. Stomach/Bowel: Gastric antral underdistention. Apparent wall thickening is likely secondary. Normal colon, appendix, and terminal ileum. Normal small bowel. Vascular/Lymphatic: Aortic atherosclerosis. No abdominopelvic adenopathy. Reproductive: Hysterectomy.  No adnexal mass. Other: No significant free fluid. No extracystic gas. No free intraperitoneal air. Small fat containing ventral abdominal wall hernia. Musculoskeletal: Transitional S1 vertebral body. Degenerative disc disease at  L4-5. IMPRESSION: 1. Marked bladder wall thickening with intraluminal and intramural gas, most consistent with emphysematous cystitis. 2.  Aortic Atherosclerosis (ICD10-I70.0). Electronically Signed   By: Jeronimo Greaves M.D.   On: 01/11/2024 15:50   CT ABDOMEN PELVIS WO CONTRAST Result Date: 01/10/2024 CLINICAL DATA:  Three day history of abdominal pain and nausea EXAM: CT ABDOMEN AND PELVIS WITHOUT CONTRAST TECHNIQUE: Multidetector CT imaging of the abdomen and pelvis was performed following the standard protocol without IV contrast. RADIATION DOSE REDUCTION: This exam was performed according to the departmental dose-optimization program which includes automated exposure control, adjustment of the mA and/or kV according to patient size and/or use of iterative reconstruction technique. COMPARISON:  CT abdomen and pelvis dated 11/11/2023 FINDINGS: Lower chest: No focal consolidation or pulmonary nodule in the lung bases. No pleural effusion or pneumothorax demonstrated. Partially imaged heart size is normal. Hepatobiliary: No focal hepatic lesions. No intra or extrahepatic biliary ductal dilation. Normal gallbladder. Pancreas: No focal lesions or main ductal dilation. Spleen: Normal in size without focal abnormality. Adrenals/Urinary Tract: No adrenal nodules. No suspicious renal mass on this noncontrast enhanced examination, calculi or hydronephrosis. Moderately distended urinary bladder demonstrates mild circumferential mural thickening. Stomach/Bowel: Mild mural thickening of the partially imaged distal esophagus. Normal appearance of the stomach. No evidence of bowel wall thickening, distention, or inflammatory changes. Colonic diverticulosis without acute diverticulitis. Normal appendix. Vascular/Lymphatic: Aortic atherosclerosis. No enlarged abdominal or pelvic lymph nodes. Reproductive: No adnexal masses. Other: No free fluid, fluid collection, or free air. Musculoskeletal: No acute or abnormal lytic or  blastic osseous lesions. Small fat-containing paraumbilical hernia. IMPRESSION: 1. Mild circumferential mural thickening of the partially imaged distal esophagus, which can be seen in the setting of esophagitis. 2. Moderately distended urinary bladder demonstrates mild circumferential mural thickening, which may be due to cystitis. Recommend correlation with urinalysis and urinary retention. 3. Colonic diverticulosis without acute diverticulitis. 4.  Aortic Atherosclerosis (ICD10-I70.0). Electronically Signed   By: Agustin Cree M.D.   On: 01/10/2024 10:26   DG ABD ACUTE 2+V W 1V CHEST Result Date: 01/10/2024 CLINICAL DATA:  One-week history of constipation and nausea EXAM: DG ABDOMEN ACUTE WITH 1 VIEW CHEST COMPARISON:  CT abdomen and pelvis dated 11/11/2023 FINDINGS: Lines/tubes: None. Chest: Lungs are clear without focal consolidation. No pneumothorax or pleural effusion. Normal heart size. Abdomen: Nonobstructive bowel gas pattern. No pneumatosis or free air. Moderate volume stool within the colon. No abnormal calcification. Pelvic soft tissue density, likely corresponding to distended urinary bladder. Bones: No acute osseous abnormality. IMPRESSION: 1. Nonobstructive bowel gas pattern. Moderate volume stool within the colon. 2. Pelvic soft tissue density, likely corresponding to distended urinary bladder. Recommend correlation with urinary retention. Electronically Signed   By: Agustin Cree  M.D.   On: 01/10/2024 09:58     Medical Consultants:   None.   Subjective:    Shandricka C Schroeck relates she feels better.  Objective:    Vitals:   01/11/24 2000 01/11/24 2247 01/12/24 0221 01/12/24 0637  BP:  105/82 93/66 107/66  Pulse:  82 74 76  Resp:  19 19 19   Temp:  98.6 F (37 C) 98.2 F (36.8 C) 98.3 F (36.8 C)  TempSrc:  Oral Oral Oral  SpO2:  99% 98% 99%  Weight: 52.1 kg     Height: 4\' 11"  (1.499 m)      SpO2: 99 %   Intake/Output Summary (Last 24 hours) at 01/12/2024 0947 Last data filed  at 01/12/2024 0600 Gross per 24 hour  Intake 100 ml  Output 100 ml  Net 0 ml   Filed Weights   01/11/24 2000  Weight: 52.1 kg    Exam: General exam: In no acute distress. Respiratory system: Good air movement and clear to auscultation. Cardiovascular system: S1 & S2 heard, RRR. No JVD. Gastrointestinal system: Abdomen is nondistended, soft and nontender.  Extremities: No pedal edema. Skin: No rashes, lesions or ulcers Psychiatry: Judgement and insight appear normal. Mood & affect appropriate.    Data Reviewed:    Labs: Basic Metabolic Panel: Recent Labs  Lab 01/05/24 1208 01/05/24 1646 01/05/24 1942 01/10/24 0932 01/11/24 0732 01/12/24 0450  NA 138 136 136 136 133* 133*  K 3.5 3.4* 4.0 4.0 3.9 4.0  CL 95* 97* 99 101 98 102  CO2 31 29 27   --  23 22  GLUCOSE 129* 135* 129* 313* 233* 176*  BUN 22* 21* 20 26* 20 20  CREATININE 0.54 0.62 0.61 0.70 0.64 0.58  CALCIUM 8.8* 8.7* 8.5*  --  9.0 8.3*   GFR Estimated Creatinine Clearance: 55.3 mL/min (by C-G formula based on SCr of 0.58 mg/dL). Liver Function Tests: Recent Labs  Lab 01/11/24 0732  AST 12*  ALT 12  ALKPHOS 67  BILITOT 0.7  PROT 7.4  ALBUMIN 4.0   No results for input(s): "LIPASE", "AMYLASE" in the last 168 hours. No results for input(s): "AMMONIA" in the last 168 hours. Coagulation profile Recent Labs  Lab 01/06/24 0239  INR 1.0   COVID-19 Labs  No results for input(s): "DDIMER", "FERRITIN", "LDH", "CRP" in the last 72 hours.  Lab Results  Component Value Date   SARSCOV2NAA NEGATIVE 01/05/2024   SARSCOV2NAA NEGATIVE 11/12/2023   SARSCOV2NAA NEGATIVE 02/02/2023   SARSCOV2NAA NEGATIVE 01/17/2022    CBC: Recent Labs  Lab 01/06/24 0239 01/10/24 0926 01/10/24 0932 01/11/24 0732 01/12/24 0450  WBC 8.8 8.4  --  11.7* 10.5  NEUTROABS  --  5.3  --  7.3  --   HGB 12.6 14.4 15.3* 14.8 13.0  HCT 38.3 45.6 45.0 44.8 39.9  MCV 86.5 87.2  --  85.7 85.8  PLT 305 307  --  390 318   Cardiac  Enzymes: No results for input(s): "CKTOTAL", "CKMB", "CKMBINDEX", "TROPONINI" in the last 168 hours. BNP (last 3 results) No results for input(s): "PROBNP" in the last 8760 hours. CBG: Recent Labs  Lab 01/06/24 1205 01/10/24 0845 01/11/24 0657 01/11/24 2244 01/12/24 0834  GLUCAP 172* 291* 228* 204* 227*   D-Dimer: No results for input(s): "DDIMER" in the last 72 hours. Hgb A1c: No results for input(s): "HGBA1C" in the last 72 hours. Lipid Profile: No results for input(s): "CHOL", "HDL", "LDLCALC", "TRIG", "CHOLHDL", "LDLDIRECT" in the last 72 hours. Thyroid  function studies: No results for input(s): "TSH", "T4TOTAL", "T3FREE", "THYROIDAB" in the last 72 hours.  Invalid input(s): "FREET3" Anemia work up: No results for input(s): "VITAMINB12", "FOLATE", "FERRITIN", "TIBC", "IRON", "RETICCTPCT" in the last 72 hours. Sepsis Labs: Recent Labs  Lab 01/06/24 0239 01/10/24 0926 01/11/24 0732 01/11/24 1800 01/12/24 0450  WBC 8.8 8.4 11.7*  --  10.5  LATICACIDVEN  --   --   --  0.9  --    Microbiology Recent Results (from the past 240 hours)  Resp panel by RT-PCR (RSV, Flu A&B, Covid) Anterior Nasal Swab     Status: None   Collection Time: 01/05/24  7:19 AM   Specimen: Anterior Nasal Swab  Result Value Ref Range Status   SARS Coronavirus 2 by RT PCR NEGATIVE NEGATIVE Final    Comment: (NOTE) SARS-CoV-2 target nucleic acids are NOT DETECTED.  The SARS-CoV-2 RNA is generally detectable in upper respiratory specimens during the acute phase of infection. The lowest concentration of SARS-CoV-2 viral copies this assay can detect is 138 copies/mL. A negative result does not preclude SARS-Cov-2 infection and should not be used as the sole basis for treatment or other patient management decisions. A negative result may occur with  improper specimen collection/handling, submission of specimen other than nasopharyngeal swab, presence of viral mutation(s) within the areas targeted by  this assay, and inadequate number of viral copies(<138 copies/mL). A negative result must be combined with clinical observations, patient history, and epidemiological information. The expected result is Negative.  Fact Sheet for Patients:  BloggerCourse.com  Fact Sheet for Healthcare Providers:  SeriousBroker.it  This test is no t yet approved or cleared by the [place] FDA and  has been authorized for detection and/or diagnosis of SARS-CoV-2 by FDA under an Emergency Use Authorization (EUA). This EUA will remain  in effect (meaning this test can be used) for the duration of the COVID-19 declaration under Section 564(b)(1) of the Act, 21 U.S.C.section 360bbb-3(b)(1), unless the authorization is terminated  or revoked sooner.       Influenza A by PCR NEGATIVE NEGATIVE Final   Influenza B by PCR NEGATIVE NEGATIVE Final    Comment: (NOTE) The Xpert Xpress SARS-CoV-2/FLU/RSV plus assay is intended as an aid in the diagnosis of influenza from Nasopharyngeal swab specimens and should not be used as a sole basis for treatment. Nasal washings and aspirates are unacceptable for Xpert Xpress SARS-CoV-2/FLU/RSV testing.  Fact Sheet for Patients: BloggerCourse.com  Fact Sheet for Healthcare Providers: SeriousBroker.it  This test is not yet approved or cleared by the [place] FDA and has been authorized for detection and/or diagnosis of SARS-CoV-2 by FDA under an Emergency Use Authorization (EUA). This EUA will remain in effect (meaning this test can be used) for the duration of the COVID-19 declaration under Section 564(b)(1) of the Act, 21 U.S.C. section 360bbb-3(b)(1), unless the authorization is terminated or revoked.     Resp Syncytial Virus by PCR NEGATIVE NEGATIVE Final    Comment: (NOTE) Fact Sheet for Patients: BloggerCourse.com  Fact Sheet  for Healthcare Providers: SeriousBroker.it  This test is not yet approved or cleared by the [place] FDA and has been authorized for detection and/or diagnosis of SARS-CoV-2 by FDA under an Emergency Use Authorization (EUA). This EUA will remain in effect (meaning this test can be used) for the duration of the COVID-19 declaration under Section 564(b)(1) of the Act, 21 U.S.C. section 360bbb-3(b)(1), unless the authorization is terminated or revoked.  Performed at [facility],  [name] [place], [place] [address]   MRSA Next Gen by PCR, Nasal     Status: None   Collection Time: 01/05/24  3:07 PM   Specimen: Nasal Mucosa; Nasal Swab  Result Value Ref Range Status   MRSA by PCR Next Gen NOT DETECTED NOT DETECTED Final    Comment: (NOTE) The GeneXpert MRSA Assay (FDA approved for NASAL specimens only), is one component of a comprehensive MRSA colonization surveillance program. It is not intended to diagnose MRSA infection nor to guide or monitor treatment for MRSA infections. Test performance is not FDA approved in patients less than 78 years old. Performed at [name], [address], [place], [place] [address]   Blood culture (routine x 2)     Status: None (Preliminary result)   Collection Time: 01/11/24  8:25 PM   Specimen: BLOOD LEFT HAND  Result Value Ref Range Status   Specimen Description   Final    BLOOD LEFT HAND Performed at [facility], [address], [place], [place] [address]    Special Requests   Final    BOTTLES DRAWN AEROBIC AND ANAEROBIC Blood Culture results may not be optimal due to an inadequate volume of blood received in culture bottles Performed at [facility], [address], [place], [place] [address]    Culture PENDING  Incomplete   Report Status PENDING  Incomplete  Blood culture (routine x 2)     Status: None (Preliminary result)   Collection  Time: 01/11/24  8:31 PM   Specimen: BLOOD RIGHT HAND  Result Value Ref Range Status   Specimen Description   Final    BLOOD RIGHT HAND Performed at [facility], [address], [place], [place] [address]    Special Requests   Final    BOTTLES DRAWN AEROBIC AND ANAEROBIC Blood Culture results may not be optimal due to an inadequate volume of blood received in culture bottles Performed at [name], [address], [place], [place] [address]    Culture PENDING  Incomplete   Report Status PENDING  Incomplete     Medications:    aspirin EC  81 mg Oral Daily   escitalopram  10 mg Oral Daily   insulin aspart  0-15 Units Subcutaneous TID WC   insulin glargine-yfgn  25 Units Subcutaneous QHS   [START ON 01/13/2024] levothyroxine  137 mcg Oral QAC breakfast   metoCLOPramide  5 mg Oral TID AC   omega-3 acid ethyl esters  2 g Oral BID   pantoprazole  40 mg Oral BID   QUEtiapine  50 mg Oral QHS   rosuvastatin  10 mg Oral Daily   sucralfate  1 g Oral BID   tamsulosin  0.4 mg Oral QPC supper   Continuous Infusions:  cefTRIAXone (ROCEPHIN)  IV 2 g (01/12/24 0838)      LOS: 0 days   Marinda Elk  [facility]  01/12/2024, 9:47 AM

## 2024-01-12 NOTE — Progress Notes (Signed)
Subjective: Patient stable. Feeling better. VSS. Pain controlled.   Foley placed by urology overnight. Urine with evidence of thin gross hematuria, raspberry colored but thin.   Objective: Vital signs in last 24 hours: Temp:  [98.2 F (36.8 C)-98.6 F (37 C)] 98.3 F (36.8 C) (02/02 0637) Pulse Rate:  [74-97] 76 (02/02 0637) Resp:  [16-19] 19 (02/02 0637) BP: (93-136)/(66-99) 107/66 (02/02 0637) SpO2:  [98 %-100 %] 99 % (02/02 0637) Weight:  [52.1 kg] 52.1 kg (02/01 2000)  Assessment/Plan: #Emphysematous cystitis -continue foley cathter until cultures result. Ok to TOV once transitioned to oral antibiotics.  -follow up cultures. Transition to oral antibiotics if able based on susceptibilities and treat for total course of 10 days.   #Gross hematuria  -Urine thin raspberry -plan to perform outpatient cystoscopy to evaluate bladder for any pathology. Bleeding likely in setting of severe infection.   #Neurogenic bladder -likely in the setting of poorly controlled diabetes.  -plan to person outpatient urodynamics to assess bladder function.  Intake/Output from previous day: 02/01 0701 - 02/02 0700 In: 100 [IV Piggyback:100] Out: 100 [Urine:100]  Intake/Output this shift: No intake/output data recorded.  Physical Exam:  General: Alert and oriented CV: No cyanosis Lungs: equal chest rise Abdomen: Soft, NTND, no rebound or guarding Gu: foley in place with raspberry colored thin urine without clot.   Lab Results: Recent Labs    01/10/24 0932 01/11/24 0732 01/12/24 0450  HGB 15.3* 14.8 13.0  HCT 45.0 44.8 39.9   BMET Recent Labs    01/11/24 0732 01/12/24 0450  NA 133* 133*  K 3.9 4.0  CL 98 102  CO2 23 22  GLUCOSE 233* 176*  BUN 20 20  CREATININE 0.64 0.58  CALCIUM 9.0 8.3*     Studies/Results: CT ABDOMEN PELVIS W CONTRAST Result Date: 01/11/2024 CLINICAL DATA:  Abdominal pain and hematuria. Pyelonephritis suspected. EXAM: CT ABDOMEN AND PELVIS WITH  CONTRAST TECHNIQUE: Multidetector CT imaging of the abdomen and pelvis was performed using the standard protocol following bolus administration of intravenous contrast. RADIATION DOSE REDUCTION: This exam was performed according to the departmental dose-optimization program which includes automated exposure control, adjustment of the mA and/or kV according to patient size and/or use of iterative reconstruction technique. CONTRAST:  OMNIPAQUE IOHEXOL 300 MG/ML  SOLN COMPARISON:  01/10/2024 FINDINGS: Lower chest: Clear lung bases. Normal heart size without pericardial or pleural effusion. Hepatobiliary: Focal steatosis adjacent the falciform ligament. Hepatomegaly versus a Riedel's lobe at 20.5 cm. Normal gallbladder, without biliary ductal dilatation. Pancreas: Normal, without mass or ductal dilatation. Spleen: Normal in size, without focal abnormality. Adrenals/Urinary Tract: Normal adrenal glands. Normal kidneys, without hydronephrosis. There is marked bladder wall thickening. Circumferential gas about the periphery of the bladder lumen is favored to be mucosal. There is small volume intraluminal gas as well. Example 65/2. Stomach/Bowel: Gastric antral underdistention. Apparent wall thickening is likely secondary. Normal colon, appendix, and terminal ileum. Normal small bowel. Vascular/Lymphatic: Aortic atherosclerosis. No abdominopelvic adenopathy. Reproductive: Hysterectomy.  No adnexal mass. Other: No significant free fluid. No extracystic gas. No free intraperitoneal air. Small fat containing ventral abdominal wall hernia. Musculoskeletal: Transitional S1 vertebral body. Degenerative disc disease at L4-5. IMPRESSION: 1. Marked bladder wall thickening with intraluminal and intramural gas, most consistent with emphysematous cystitis. 2.  Aortic Atherosclerosis (ICD10-I70.0). Electronically Signed   By: Jeronimo Greaves M.D.   On: 01/11/2024 15:50      LOS: 0 days   Jerald Kief, MD, PhD [facility]  Resident  [id number] [facility]    01/12/2024, 10:39 AM

## 2024-01-12 NOTE — Progress Notes (Addendum)
Hypoglycemic Event  CBG: 61  Treatment: 4 oz OJ  Symptoms: Shaky, hungry  Follow-up CBG: Time:1718 CBG Result:96  Possible Reasons for Event: inadequate po intake  Comments/MD notified: pt refused IV Dextrose and took 4 oz OJ instead (stated her nausea had passed)    Diona Browner, RN

## 2024-01-12 NOTE — Progress Notes (Signed)
Mobility Specialist - Progress Note   01/12/24 1319  Mobility  Activity Ambulated independently in hallway  Level of Assistance Independent after set-up  Assistive Device None  Distance Ambulated (ft) 250 ft  Activity Response Tolerated well  Mobility Referral Yes  Mobility visit 1 Mobility  Mobility Specialist Start Time (ACUTE ONLY) 1310  Mobility Specialist Stop Time (ACUTE ONLY) 1318  Mobility Specialist Time Calculation (min) (ACUTE ONLY) 8 min   Pt received in bed and agreeable to mobility. Distance limited d/t pain. No complaints during session. Pt to bed after session with all needs met.    [facility]

## 2024-01-13 DIAGNOSIS — N3080 Other cystitis without hematuria: Secondary | ICD-10-CM | POA: Diagnosis not present

## 2024-01-13 LAB — GLUCOSE, CAPILLARY
Glucose-Capillary: 116 mg/dL — ABNORMAL HIGH (ref 70–99)
Glucose-Capillary: 145 mg/dL — ABNORMAL HIGH (ref 70–99)
Glucose-Capillary: 179 mg/dL — ABNORMAL HIGH (ref 70–99)
Glucose-Capillary: 217 mg/dL — ABNORMAL HIGH (ref 70–99)
Glucose-Capillary: 131 mg/dL — ABNORMAL HIGH (ref 70–99)

## 2024-01-13 NOTE — Progress Notes (Signed)
Subjective: NAEON. First time meeting [name] [name]. Case and plan reviewed  Objective: Vital signs in last 24 hours: Temp:  [97.7 F (36.5 C)-99.3 F (37.4 C)] 99.3 F (37.4 C) (02/03 0943) Pulse Rate:  [70-82] 82 (02/03 0943) Resp:  [14-18] 17 (02/03 0943) BP: (83-107)/(58-72) 83/58 (02/03 0943) SpO2:  [94 %-100 %] 98 % (02/03 0943) Weight:  [53.5 kg] 53.5 kg (02/03 0500)  Assessment/Plan: #Emphysematous cystitis -continue foley cathter until cultures result. Ok to TOV once transitioned to oral antibiotics.  -follow up cultures. Transition to oral antibiotics if able based on susceptibilities and treat for total course of 10 days.   #Gross hematuria - resolved - Urine clear yellow today - plan to perform outpatient cystoscopy to evaluate bladder for any pathology. Bleeding likely in setting of severe infection.   #Neurogenic bladder - likely in the setting of poorly controlled diabetes.  - plan to person outpatient urodynamics to assess bladder function.  - Continue Flomax.  Typically has little efficacy in female patients for urinary retention, but patient states she has a much better stream while receiving the medication. - Normothermic > 24h, normal white count, preserved renal function. consider voiding trial as pt nears discharge. She will benefit from maximum drainage for the time being.    Intake/Output from previous day: 02/02 0701 - 02/03 0700 In: 1800 [P.O.:1700; IV Piggyback:100] Out: 1400 [Urine:1400]  Intake/Output this shift: Total I/O In: 120 [P.O.:120] Out: 400 [Urine:400]  Physical Exam:  General: Alert and oriented CV: No cyanosis Lungs: equal chest rise Gu: foley in place with clear yellow urine  Lab Results: Recent Labs    01/11/24 0732 01/12/24 0450  HGB 14.8 13.0  HCT 44.8 39.9   BMET Recent Labs    01/11/24 0732 01/12/24 0450  NA 133* 133*  K 3.9 4.0  CL 98 102  CO2 23 22  GLUCOSE 233* 176*  BUN 20 20  CREATININE 0.64 0.58   CALCIUM 9.0 8.3*     Studies/Results: CT ABDOMEN PELVIS W CONTRAST Result Date: 01/11/2024 CLINICAL DATA:  Abdominal pain and hematuria. Pyelonephritis suspected. EXAM: CT ABDOMEN AND PELVIS WITH CONTRAST TECHNIQUE: Multidetector CT imaging of the abdomen and pelvis was performed using the standard protocol following bolus administration of intravenous contrast. RADIATION DOSE REDUCTION: This exam was performed according to the departmental dose-optimization program which includes automated exposure control, adjustment of the mA and/or kV according to patient size and/or use of iterative reconstruction technique. CONTRAST:  OMNIPAQUE IOHEXOL 300 MG/ML  SOLN COMPARISON:  01/10/2024 FINDINGS: Lower chest: Clear lung bases. Normal heart size without pericardial or pleural effusion. Hepatobiliary: Focal steatosis adjacent the falciform ligament. Hepatomegaly versus a Riedel's lobe at 20.5 cm. Normal gallbladder, without biliary ductal dilatation. Pancreas: Normal, without mass or ductal dilatation. Spleen: Normal in size, without focal abnormality. Adrenals/Urinary Tract: Normal adrenal glands. Normal kidneys, without hydronephrosis. There is marked bladder wall thickening. Circumferential gas about the periphery of the bladder lumen is favored to be mucosal. There is small volume intraluminal gas as well. Example 65/2. Stomach/Bowel: Gastric antral underdistention. Apparent wall thickening is likely secondary. Normal colon, appendix, and terminal ileum. Normal small bowel. Vascular/Lymphatic: Aortic atherosclerosis. No abdominopelvic adenopathy. Reproductive: Hysterectomy.  No adnexal mass. Other: No significant free fluid. No extracystic gas. No free intraperitoneal air. Small fat containing ventral abdominal wall hernia. Musculoskeletal: Transitional S1 vertebral body. Degenerative disc disease at L4-5. IMPRESSION: 1. Marked bladder wall thickening with intraluminal and intramural gas, most consistent  with emphysematous  cystitis. 2.  Aortic Atherosclerosis (ICD10-I70.0). Electronically Signed   By: Jeronimo Greaves M.D.   On: 01/11/2024 15:50      LOS: 1 day   Elmon Kirschner, NP [facility] Pager: [phone]    01/13/2024, 12:46 PM

## 2024-01-13 NOTE — Plan of Care (Signed)
  Problem: Nutritional: Goal: Maintenance of adequate nutrition will improve Outcome: Progressing   Problem: Education: Goal: Knowledge of General Education information will improve Description: Including pain rating scale, medication(s)/side effects and non-pharmacologic comfort measures Outcome: Progressing   Problem: Nutrition: Goal: Adequate nutrition will be maintained Outcome: Progressing

## 2024-01-13 NOTE — Progress Notes (Signed)
[facility] PROGRESS NOTE    Progress Note  [name] [name]  YQI:[id number] DOB: [birth year] DOA: 01/11/2024 PCP: Everardo All, NP     Brief Narrative:   [name] [name] is an 63 y.o. female past medical history significant for diabetes mellitus type 2 Chronic diastolic dysfunction with an EF of 55%, essential hypertension IBS, who came into the ED on 01/10/2024 diagnosed with a UTI sent home on antibiotics did not pick them up, comes back again into the ED complaining of abdominal pain, CT scan of the abdomen pelvis show bladder wall thickening and intraluminal mural gas, concern about emphysematous cystitis. Urology was consulted they placed a Foley 70 [place].  Assessment/Plan:   Emphysematous cystitis: Continue IV Rocephin. Urine cultures and is growing greater than 100,000 colonies of E. coli, continue empiric IV Rocephin. blood cultures were sent negative till date. Has remained afebrile leukocytosis resolved. Continue narcotics for pain control allowing diet. She is not septic septic, sepsis ruled out  Uncontrolled type 1 diabetes mellitus with hyperglycemia, with long-term current use of insulin  Has a last A1c of 12.1. Excellent control blood glucose in house, continue long-acting insulin plus sliding scale. Blood glucose ranging 97-145.  Hypothyroidism Continue Synthroid.  Chronic diastolic dysfunction: Continue current home regimen no changes made.  Hyperlipidemia:  continue Crestor and Lovaza.  GERD: Continue Reglan, Protonix and Carafate.  Chronic kidney disease stage II: At baseline.  Major depressive disorder: Continue Lexapro.   DVT prophylaxis: lovenox Family Communication:none Status is: Observation The patient will require care spanning > 2 midnights and should be moved to inpatient because: Emphysematous cystitis    Code Status:     Code Status Orders  (From admission, onward)           Start     Ordered   01/11/24 1849  Full  code  Continuous       Question:  By:  Answer:  Consent: discussion documented in EHR   01/11/24 1849           Code Status History     Date Active Date Inactive Code Status Order ID Comments User Context   01/05/2024 0809 01/06/2024 2037 Full Code [id number]  Bobette Mo, MD ED   12/07/2023 1330 12/08/2023 1619 Full Code [id number]  Briscoe Burns, MD ED   11/14/2023 1125 11/18/2023 1635 Full Code [id number]  Lajoyce Corners, NP ED   11/12/2023 0032 11/12/2023 1709 Full Code [id number]  Anselm Jungling, DO ED   10/25/2023 1819 10/28/2023 1434 Full Code [id number]  Nolberto Hanlon, MD ED   09/22/2023 0922 09/25/2023 2311 Full Code [id number]  Maryln Gottron, MD ED   09/12/2023 0359 09/14/2023 1724 Full Code [id number]  Briscoe Deutscher, MD ED   09/02/2023 1551 09/04/2023 2316 Full Code [id number]  Nolberto Hanlon, MD ED   08/22/2023 2023 08/25/2023 1740 Full Code [id number]  Charlsie Quest, MD ED   08/01/2023 0903 08/03/2023 0000 Full Code [id number]  Olegario Messier, MD ED   06/14/2023 1522 06/17/2023 1341 Full Code [id number]  Maryln Gottron, MD ED   05/14/2023 1659 05/16/2023 1837 Full Code [id number]  Orland Mustard, MD ED   04/19/2023 1442 04/21/2023 2059 Full Code [id number]  Maryln Gottron, MD ED   02/02/2023 1659 02/06/2023 1828 Full Code [id number]  Alwyn Ren, MD ED   01/06/2023 1921 01/08/2023 1944 Full Code [id number]  Anselm Jungling, DO ED   03/27/2022 1948 03/29/2022 1959 Full Code [id number]  Ronaldo Miyamoto,  Tyrone A, DO Inpatient   01/18/2022 1249 01/19/2022 1533 Full Code [id number]  Wynetta Fines, MD ED   08/16/2020 1031 08/17/2020 1553 Full Code [id number]  Jae Dire, MD ED   05/30/2019 0546 06/03/2019 2108 Full Code [id number]  Briscoe Deutscher, MD Inpatient   04/23/2019 1450 04/24/2019 1442 Full Code [id number]  Jonah Blue, MD ED   03/25/2019 1934 03/28/2019 0011 Full Code [id number]  Barnetta Chapel, MD Inpatient   03/25/2019 1934 03/25/2019 1934 Full Code [id number]  Barnetta Chapel, MD Inpatient    01/12/2019 1912 01/14/2019 1642 Full Code [id number]  Alba Cory, MD ED   11/26/2018 1539 11/28/2018 1346 Full Code [id number]  Bobette Mo, MD ED   11/26/2018 1530 11/26/2018 1539 Full Code [id number]  Bobette Mo, MD ED   10/19/2018 1541 10/20/2018 2122 Full Code [id number]  Inez Catalina, MD Inpatient   09/22/2018 1645 09/23/2018 1510 Full Code [id number]  Zigmund Daniel., MD Inpatient   09/22/2018 1645 09/22/2018 1645 Full Code [id number]  Zigmund Daniel., MD Inpatient   09/11/2018 1452 09/12/2018 1621 Full Code [id number]  Jonah Blue, MD ED   08/04/2018 1038 08/05/2018 2012 Full Code [id number]  Gwenyth Bender, NP ED   06/27/2018 1633 06/29/2018 1757 Full Code [id number]  Leatha Gilding, MD ED   05/26/2018 0242 05/28/2018 2108 Full Code [id number]  Eduard Clos, MD ED   03/06/2018 2107 03/08/2018 1752 Full Code [id number]  Briscoe Deutscher, MD ED   02/02/2018 1129 02/04/2018 1854 Full Code [id number]  Haydee Monica, MD ED   12/11/2017 1232 12/14/2017 1658 Full Code [id number]  Marcos Eke, PA-C ED   09/19/2017 0746 09/23/2017 1924 Full Code [id number]  Marcos Eke, PA-C ED   09/17/2017 2203 09/18/2017 1940 Full Code [id number]  Clydie Braun, MD ED   06/22/2017 1709 06/24/2017 1930 Full Code [id number]  Jordan Hawks, DO Inpatient   06/03/2017 1626 06/05/2017 1955 Full Code [id number]  Marcos Eke, PA-C ED   04/05/2017 1143 04/08/2017 2117 Full Code [id number]  Gwenyth Bender, NP ED   03/22/2017 2120 03/23/2017 1831 Full Code [id number]  Alberteen Sam, MD Inpatient   02/09/2017 0958 02/11/2017 2302 Full Code [id number]  Russella Dar, NP ED   07/24/2016 1633 07/26/2016 2113 Full Code [id number]  Ozella Rocks, MD ED   07/24/2016 1633 07/24/2016 1633 Full Code [id number]  Ozella Rocks, MD ED   10/28/2013 2338 10/29/2013 1749 Full Code [id number]  Eddie North, MD ED   09/18/2013 1649 09/20/2013 1637 Full Code [id number]  Hollice Espy, MD  Inpatient   09/23/2012 1135 09/25/2012 1753 Full Code [id number]  Pat Patrick, RN Inpatient         IV Access:   Peripheral IV   Procedures and diagnostic studies:   CT ABDOMEN PELVIS W CONTRAST Result Date: 01/11/2024 CLINICAL DATA:  Abdominal pain and hematuria. Pyelonephritis suspected. EXAM: CT ABDOMEN AND PELVIS WITH CONTRAST TECHNIQUE: Multidetector CT imaging of the abdomen and pelvis was performed using the standard protocol following bolus administration of intravenous contrast. RADIATION DOSE REDUCTION: This exam was performed according to the departmental dose-optimization program which includes automated exposure control, adjustment of the mA and/or kV according to patient size and/or use of iterative reconstruction technique. CONTRAST:  OMNIPAQUE IOHEXOL 300 MG/ML  SOLN COMPARISON:  01/10/2024 FINDINGS: Lower chest: Clear lung bases. Normal heart size without pericardial or pleural effusion. Hepatobiliary: Focal steatosis  adjacent the falciform ligament. Hepatomegaly versus a Riedel's lobe at 20.5 cm. Normal gallbladder, without biliary ductal dilatation. Pancreas: Normal, without mass or ductal dilatation. Spleen: Normal in size, without focal abnormality. Adrenals/Urinary Tract: Normal adrenal glands. Normal kidneys, without hydronephrosis. There is marked bladder wall thickening. Circumferential gas about the periphery of the bladder lumen is favored to be mucosal. There is small volume intraluminal gas as well. Example 65/2. Stomach/Bowel: Gastric antral underdistention. Apparent wall thickening is likely secondary. Normal colon, appendix, and terminal ileum. Normal small bowel. Vascular/Lymphatic: Aortic atherosclerosis. No abdominopelvic adenopathy. Reproductive: Hysterectomy.  No adnexal mass. Other: No significant free fluid. No extracystic gas. No free intraperitoneal air. Small fat containing ventral abdominal wall hernia. Musculoskeletal: Transitional S1 vertebral  body. Degenerative disc disease at L4-5. IMPRESSION: 1. Marked bladder wall thickening with intraluminal and intramural gas, most consistent with emphysematous cystitis. 2.  Aortic Atherosclerosis (ICD10-I70.0). Electronically Signed   By: Jeronimo Greaves M.D.   On: 01/11/2024 15:50     Medical Consultants:   None.   Subjective:    [name] [name] feels that she has been tolerating her diet today.  Objective:    Vitals:   01/13/24 0047 01/13/24 0500 01/13/24 0552 01/13/24 0943  BP: 103/71  107/72 (!) 83/58  Pulse: 70  77 82  Resp:   14 17  Temp:   97.9 F (36.6 C) 99.3 F (37.4 C)  TempSrc:    Oral  SpO2:   94% 98%  Weight:  53.5 kg    Height:       SpO2: 98 %   Intake/Output Summary (Last 24 hours) at 01/13/2024 1011 Last data filed at 01/13/2024 0947 Gross per 24 hour  Intake 1220 ml  Output 1800 ml  Net -580 ml   Filed Weights   01/11/24 2000 01/13/24 0500  Weight: 52.1 kg 53.5 kg    Exam: General exam: In no acute distress. Respiratory system: Good air movement and clear to auscultation. Cardiovascular system: S1 & S2 heard, RRR. No JVD. Gastrointestinal system: Positive bowel sound soft nontender nondistended Extremities: No pedal edema. Skin: No rashes, lesions or ulcers Psychiatry: Judgement and insight appear normal. Mood & affect appropriate.  Data Reviewed:    Labs: Basic Metabolic Panel: Recent Labs  Lab 01/10/24 0932 01/11/24 0732 01/12/24 0450  NA 136 133* 133*  K 4.0 3.9 4.0  CL 101 98 102  CO2  --  23 22  GLUCOSE 313* 233* 176*  BUN 26* 20 20  CREATININE 0.70 0.64 0.58  CALCIUM  --  9.0 8.3*   GFR Estimated Creatinine Clearance: 55.8 mL/min (by C-G formula based on SCr of 0.58 mg/dL). Liver Function Tests: Recent Labs  Lab 01/11/24 0732  AST 12*  ALT 12  ALKPHOS 67  BILITOT 0.7  PROT 7.4  ALBUMIN 4.0   No results for input(s): "LIPASE", "AMYLASE" in the last 168 hours. No results for input(s): "AMMONIA" in the last 168  hours. Coagulation profile No results for input(s): "INR", "PROTIME" in the last 168 hours.  COVID-19 Labs  No results for input(s): "DDIMER", "FERRITIN", "LDH", "CRP" in the last 72 hours.  Lab Results  Component Value Date   SARSCOV2NAA NEGATIVE 01/05/2024   SARSCOV2NAA NEGATIVE 11/12/2023   SARSCOV2NAA NEGATIVE 02/02/2023   SARSCOV2NAA NEGATIVE 01/17/2022    CBC: Recent Labs  Lab 01/10/24 0926 01/10/24 0932 01/11/24 0732 01/12/24 0450  WBC 8.4  --  11.7* 10.5  NEUTROABS 5.3  --  7.3  --   HGB  14.4 15.3* 14.8 13.0  HCT 45.6 45.0 44.8 39.9  MCV 87.2  --  85.7 85.8  PLT 307  --  390 318   Cardiac Enzymes: No results for input(s): "CKTOTAL", "CKMB", "CKMBINDEX", "TROPONINI" in the last 168 hours. BNP (last 3 results) No results for input(s): "PROBNP" in the last 8760 hours. CBG: Recent Labs  Lab 01/12/24 1625 01/12/24 1718 01/12/24 2230 01/13/24 0751 01/13/24 0941  GLUCAP 61* 96 97 116* 145*   D-Dimer: No results for input(s): "DDIMER" in the last 72 hours. Hgb A1c: No results for input(s): "HGBA1C" in the last 72 hours. Lipid Profile: No results for input(s): "CHOL", "HDL", "LDLCALC", "TRIG", "CHOLHDL", "LDLDIRECT" in the last 72 hours. Thyroid function studies: No results for input(s): "TSH", "T4TOTAL", "T3FREE", "THYROIDAB" in the last 72 hours.  Invalid input(s): "FREET3" Anemia work up: No results for input(s): "VITAMINB12", "FOLATE", "FERRITIN", "TIBC", "IRON", "RETICCTPCT" in the last 72 hours. Sepsis Labs: Recent Labs  Lab 01/10/24 0926 01/11/24 0732 01/11/24 1800 01/12/24 0450  WBC 8.4 11.7*  --  10.5  LATICACIDVEN  --   --  0.9  --    Microbiology Recent Results (from the past 240 hours)  Resp panel by RT-PCR (RSV, Flu A&B, Covid) Anterior Nasal Swab     Status: None   Collection Time: 01/05/24  7:19 AM   Specimen: Anterior Nasal Swab  Result Value Ref Range Status   SARS Coronavirus 2 by RT PCR NEGATIVE NEGATIVE Final    Comment:  (NOTE) SARS-CoV-2 target nucleic acids are NOT DETECTED.  The SARS-CoV-2 RNA is generally detectable in upper respiratory specimens during the acute phase of infection. The lowest concentration of SARS-CoV-2 viral copies this assay can detect is 138 copies/mL. A negative result does not preclude SARS-Cov-2 infection and should not be used as the sole basis for treatment or other patient management decisions. A negative result may occur with  improper specimen collection/handling, submission of specimen other than nasopharyngeal swab, presence of viral mutation(s) within the areas targeted by this assay, and inadequate number of viral copies(<138 copies/mL). A negative result must be combined with clinical observations, patient history, and epidemiological information. The expected result is Negative.  Fact Sheet for Patients:  BloggerCourse.com  Fact Sheet for Healthcare Providers:  SeriousBroker.it  This test is no t yet approved or cleared by the [place] FDA and  has been authorized for detection and/or diagnosis of SARS-CoV-2 by FDA under an Emergency Use Authorization (EUA). This EUA will remain  in effect (meaning this test can be used) for the duration of the COVID-19 declaration under Section 564(b)(1) of the Act, 21 U.S.C.section 360bbb-3(b)(1), unless the authorization is terminated  or revoked sooner.       Influenza A by PCR NEGATIVE NEGATIVE Final   Influenza B by PCR NEGATIVE NEGATIVE Final    Comment: (NOTE) The Xpert Xpress SARS-CoV-2/FLU/RSV plus assay is intended as an aid in the diagnosis of influenza from Nasopharyngeal swab specimens and should not be used as a sole basis for treatment. Nasal washings and aspirates are unacceptable for Xpert Xpress SARS-CoV-2/FLU/RSV testing.  Fact Sheet for Patients: BloggerCourse.com  Fact Sheet for Healthcare  Providers: SeriousBroker.it  This test is not yet approved or cleared by the [place] FDA and has been authorized for detection and/or diagnosis of SARS-CoV-2 by FDA under an Emergency Use Authorization (EUA). This EUA will remain in effect (meaning this test can be used) for the duration of the COVID-19 declaration under Section 564(b)(1) of the  Act, 21 U.S.C. section 360bbb-3(b)(1), unless the authorization is terminated or revoked.     Resp Syncytial Virus by PCR NEGATIVE NEGATIVE Final    Comment: (NOTE) Fact Sheet for Patients: BloggerCourse.com  Fact Sheet for Healthcare Providers: SeriousBroker.it  This test is not yet approved or cleared by the [place] FDA and has been authorized for detection and/or diagnosis of SARS-CoV-2 by FDA under an Emergency Use Authorization (EUA). This EUA will remain in effect (meaning this test can be used) for the duration of the COVID-19 declaration under Section 564(b)(1) of the Act, 21 U.S.C. section 360bbb-3(b)(1), unless the authorization is terminated or revoked.  Performed at [facility], [address] [place], [place] [address]   MRSA Next Gen by PCR, Nasal     Status: None   Collection Time: 01/05/24  3:07 PM   Specimen: Nasal Mucosa; Nasal Swab  Result Value Ref Range Status   MRSA by PCR Next Gen NOT DETECTED NOT DETECTED Final    Comment: (NOTE) The GeneXpert MRSA Assay (FDA approved for NASAL specimens only), is one component of a comprehensive MRSA colonization surveillance program. It is not intended to diagnose MRSA infection nor to guide or monitor treatment for MRSA infections. Test performance is not FDA approved in patients less than 49 years old. Performed at [facility], [address], [place], [place] [address]   Urine Culture     Status: Abnormal (Preliminary result)   Collection  Time: 01/11/24  7:44 AM   Specimen: Urine, Random  Result Value Ref Range Status   Specimen Description   Final    URINE, RANDOM Performed at [facility], [address] [place], [place] [address]    Special Requests   Final    NONE Reflexed from [id number] Performed at [facility], [address] [place], [place] [address]    Culture (A)  Final    >=100,000 COLONIES/mL ESCHERICHIA COLI SUSCEPTIBILITIES TO FOLLOW Performed at [facility], [address], [place], [place] [address]    Report Status PENDING  Incomplete  Blood culture (routine x 2)     Status: None (Preliminary result)   Collection Time: 01/11/24  8:25 PM   Specimen: BLOOD LEFT HAND  Result Value Ref Range Status   Specimen Description   Final    BLOOD LEFT HAND Performed at [facility], [address], [place], [place] [address]    Special Requests   Final    BOTTLES DRAWN AEROBIC AND ANAEROBIC Blood Culture results may not be optimal due to an inadequate volume of blood received in culture bottles Performed at [facility], [address] [place], [place] [address]    Culture   Final    NO GROWTH 1 DAY Performed at [facility], [address] [place], [place] [address]    Report Status PENDING  Incomplete  Blood culture (routine x 2)     Status: None (Preliminary result)   Collection Time: 01/11/24  8:31 PM   Specimen: BLOOD RIGHT HAND  Result Value Ref Range Status   Specimen Description   Final    BLOOD RIGHT HAND Performed at [facility], [address], [place], [place] [address]    Special Requests   Final    BOTTLES DRAWN AEROBIC AND ANAEROBIC Blood Culture results may not be optimal due to an inadequate volume of blood received in culture bottles Performed at [facility], [address], [place], [place] [address]    Culture   Final    NO GROWTH 1 DAY Performed at [facility],  [address], [place], [place] [address]    Report Status PENDING  Incomplete     Medications:    aspirin EC  81 mg Oral Daily   dextrose  12.5 g Intravenous STAT   escitalopram  10 mg Oral Daily   insulin aspart  0-15 Units Subcutaneous TID WC   insulin aspart  0-5 Units Subcutaneous QHS   insulin aspart  4 Units Subcutaneous TID WC   insulin glargine-yfgn  15 Units Subcutaneous BID   levothyroxine  137 mcg Oral QAC breakfast   metoCLOPramide  5 mg Oral TID AC   omega-3 acid ethyl esters  2 g Oral BID   pantoprazole  40 mg Oral BID   QUEtiapine  50 mg Oral QHS   rosuvastatin  10 mg Oral Daily   sucralfate  1 g Oral BID   tamsulosin  0.4 mg Oral QPC supper   Continuous Infusions:  cefTRIAXone (ROCEPHIN)  IV 2 g (01/13/24 0815)      LOS: 1 day   Marinda Elk  [facility]  01/13/2024, 10:11 AM

## 2024-01-14 DIAGNOSIS — N3080 Other cystitis without hematuria: Secondary | ICD-10-CM | POA: Diagnosis not present

## 2024-01-14 LAB — URINE CULTURE: Culture: >=100000 — AB

## 2024-01-14 LAB — GLUCOSE, CAPILLARY
Glucose-Capillary: 189 mg/dL — ABNORMAL HIGH (ref 70–99)
Glucose-Capillary: 168 mg/dL — ABNORMAL HIGH (ref 70–99)

## 2024-01-14 MED ORDER — SULFAMETHOXAZOLE-TRIMETHOPRIM 800-160 MG PO TABS
1.0000 | ORAL_TABLET | Freq: Two times a day (BID) | ORAL | Status: DC
  Administered 2024-01-14: 1 via ORAL
  Filled 2024-01-14: qty 1

## 2024-01-14 MED ORDER — SULFAMETHOXAZOLE-TRIMETHOPRIM 800-160 MG PO TABS: 1.0000 | ORAL_TABLET | Freq: Two times a day (BID) | ORAL | 0 refills | Status: DC

## 2024-01-14 NOTE — Progress Notes (Signed)
     Subjective: [name] [name]. First time meeting Ms. Klumb. Case and plan reviewed  Objective: Vital signs in last 24 hours: Temp:  [98 F (36.7 C)-99.3 F (37.4 C)] 98.2 F (36.8 C) (02/04 9391) Pulse Rate:  [77-82] 78 (02/04 0608) Resp:  [15-20] 15 (02/04 0608) BP: (83-116)/(58-79) 116/79 (02/04 0608) SpO2:  [96 %-100 %] 98 % (02/04 9391) Weight:  [53.7 kg] 53.7 kg (02/04 0500)  Assessment/Plan: #Emphysematous cystitis -Urine culture has finalized. Recommend transition to oral Bactrim  or sensitive cephalosporin today. 10d total treatment per Dr. Nieves. -Voiding trial with serial bladder scans -Again, expressed the importance of outpt follow up and UDS. May need repeat imaging and and extension of her ABX.   #Gross hematuria - resolved - Urine clear yellow today - plan to perform outpatient cystoscopy to evaluate bladder for any pathology.   #Neurogenic bladder - likely in the setting of poorly controlled diabetes.  - plan to person outpatient urodynamics to assess bladder function.  - Continue Flomax . Typically has little efficacy in female patients for urinary retention, but patient states she has a much better stream while receiving the medication.   Intake/Output from previous day: 02/03 0701 - 02/04 0700 In: 1430 [P.O.:1330; IV Piggyback:100] Out: 3050 [Urine:3050]  Intake/Output this shift: No intake/output data recorded.  Physical Exam:  General: Alert and oriented CV: No cyanosis Lungs: equal chest rise Gu: foley in place with clear yellow urine  Lab Results: Recent Labs    01/12/24 0450  HGB 13.0  HCT 39.9   BMET Recent Labs    01/12/24 0450  NA 133*  K 4.0  CL 102  CO2 22  GLUCOSE 176*  BUN 20  CREATININE 0.58  CALCIUM  8.3*     Studies/Results: No results found.     LOS: 2 days   Ole Bourdon, NP [facility] Pager: [phone]    01/14/2024, 8:43 AM

## 2024-01-14 NOTE — Discharge Summary (Signed)
 Physician Discharge Summary  [name] [name] FMW:[id number] DOB: 1961-02-27 DOA: 01/11/2024  PCP: Arlyss Eugene BRAVO, NP  Admit date: 01/11/2024 Discharge date: 01/14/2024  Admitted From: Home Disposition:  Home  Recommendations for Outpatient Follow-up:  Follow up with PCP in 1-2 weeks Please obtain BMP/CBC in one week Follow-up with urology as an outpatient for cystoscopy.   Home Health:No Equipment/Devices:None  Discharge Condition:Stable CODE STATUS:Full Diet recommendation: Heart Healthy   Brief/Interim Summary: 63 y.o. female past medical history significant for diabetes mellitus type 2 Chronic diastolic dysfunction with an EF of 55%, essential hypertension IBS, who came into the ED on 01/10/2024 diagnosed with a UTI sent home on antibiotics did not pick them up, comes back again into the ED complaining of abdominal pain, CT scan of the abdomen pelvis show bladder wall thickening and intraluminal mural gas, concern about emphysematous cystitis. Urology was consulted they placed a Foley 8 French.  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 coli UTI: Will start empirically on IV Rocephin  urology was consulted, who recommended insertion of Foley bloody dark urine came out. She will continue on IV Rocephin , urine culture grew E. coli pansensitive except for Macrobid . She was changed to oral Bactrim  to continue for 10 days. She was given a voiding trial. Follow-up with urology as an outpatient will need a cystoscopy.  Uncontrolled diabetes mellitus type 1 with hyperglycemia insulin  pendant: With an A1c of  12.1. She was placed on long-acting insulin  and sliding scale with excellent blood glucose control ranging from 97-1 45. The patient has been explained the importance of being compliant with her insulin  at home as this may have contributed to her emphysematous cystitis. Was basically put here in the hospital with the same regimen she is at home and her blood glucose was significantly improved.  Hypothyroidism: Continue Synthroid .  Chronic diastolic dysfunction: No changes made to her regimen.  Hyperlipidemia: Continue current home regimen.  GERD: Continue current home regimen.  Chronic kidney disease stage II: Creatinine at baseline.  Discharge Instructions  Discharge Instructions     Diet - low sodium heart healthy   Complete by: As directed    Increase activity slowly   Complete by: As directed       Allergies as of 01/14/2024       Reactions   Codeine Hives, Anxiety, Other (See Comments)        Medication List     STOP taking these medications    cephALEXin  500 MG capsule Commonly known as: KEFLEX        TAKE these medications    acetaminophen  500 MG tablet Commonly known as: TYLENOL  Take 1,000 mg by mouth every 6 (six) hours as needed for moderate pain (pain score 4-6).   aspirin  EC 81 MG tablet Take 81 mg by mouth daily. Swallow whole.   azelastine  0.1 % nasal spray Commonly known as: ASTELIN  Place 1 spray into both nostrils as needed for rhinitis or allergies.   Dexcom G6 Transmitter Misc USE AS DIRECTED FOR CONTINUOUS GLUCOSE MONITORING. REUSE TRANSMITTER X 90DAYS THEN DISCARD & REPLACE   escitalopram  10 MG tablet Commonly known as: LEXAPRO  Take 1 tablet (  10 mg total) by mouth daily.   Glucagon Emergency 1 MG Kit Inject 1 mg into the skin See admin instructions. Inject one mg into the skin see administration instructions. Follow package directions for low blood sugar.   levothyroxine  137 MCG tablet Commonly known as: SYNTHROID  Take 137  mcg by mouth daily before breakfast.   metoCLOPramide  5 MG tablet Commonly known as: Reglan  Take 1 tablet (5 mg total) by mouth 3 (three) times daily before meals.   NovoLOG  FlexPen 100 UNIT/ML FlexPen Generic drug: insulin  aspart Inject 10 Units into the skin 3 (three) times daily with meals. Plus sliding scale TID   omega-3 acid ethyl esters 1 g capsule Commonly known as: LOVAZA  Take 2 capsules (2 g total) by mouth 2 (two) times daily.   ondansetron  4 MG tablet Commonly known as: ZOFRAN  Take 2 tablets (8 mg total) by mouth every 8 (eight) hours as needed for nausea or vomiting.   ondansetron  8 MG disintegrating tablet Commonly known as: ZOFRAN -ODT Take 1 tablet (8 mg total) by mouth every 8 (eight) hours as needed for nausea or vomiting. What changed: Another medication with the same name was removed. Continue taking this medication, and follow the directions you see here.   pantoprazole  40 MG tablet Commonly known as: Protonix  Take 1 tablet (40 mg total) by mouth 2 (two) times daily.   QUEtiapine  50 MG tablet Commonly known as: SEROQUEL  Take 50 mg by mouth at bedtime.   rosuvastatin  10 MG tablet Commonly known as: CRESTOR  Take 1 tablet (10 mg total) by mouth daily.   sucralfate  1 g tablet Commonly known as: CARAFATE  Take 1 tablet (1 g total) by mouth 2 (two) times daily. Do not take any other medication within 2-4 hours.   sulfamethoxazole -trimethoprim  800-160 MG tablet Commonly known as: BACTRIM  DS Take 1 tablet by mouth every 12 (twelve) hours for 10 days.   tamsulosin  0.4 MG Caps capsule Commonly known as: FLOMAX  Take 1 capsule (0.4 mg total) by mouth daily after supper.   Toujeo  SoloStar 300 UNIT/ML Solostar Pen Generic drug: insulin  glargine (1 Unit Dial ) Inject 25 Units into the skin at bedtime.   traMADol  50 MG tablet Commonly known as: ULTRAM  1 to 2 tablets every 6 hours with extra strength Tylenol  for pain as needed   traZODone  50 MG tablet Commonly  known as: DESYREL  Take 50 mg by mouth at bedtime as needed for sleep.        Allergies  Allergen Reactions   Codeine Hives, Anxiety and Other (See Comments)    Consultations: Urology   Procedures/Studies: CT ABDOMEN PELVIS W CONTRAST Result Date: 01/11/2024 CLINICAL DATA:  Abdominal pain and hematuria. Pyelonephritis suspected. EXAM: CT ABDOMEN AND PELVIS WITH CONTRAST TECHNIQUE: Multidetector CT imaging of the abdomen and pelvis was performed using the standard protocol following bolus administration of intravenous contrast. RADIATION DOSE REDUCTION: This exam was performed according to the departmental dose-optimization program which includes automated exposure control, adjustment of the mA and/or kV according to patient size and/or use of iterative reconstruction technique. CONTRAST:  OMNIPAQUE  IOHEXOL  300 MG/ML  SOLN COMPARISON:  01/10/2024 FINDINGS: Lower chest: Clear lung bases. Normal heart size without pericardial or pleural effusion. Hepatobiliary: Focal steatosis adjacent the falciform ligament. Hepatomegaly versus a Riedel's lobe at 20.5 cm. Normal gallbladder, without biliary ductal dilatation. Pancreas: Normal, without mass or ductal dilatation. Spleen: Normal in size, without focal abnormality. Adrenals/Urinary Tract: Normal adrenal glands. Normal kidneys, without hydronephrosis. There is marked bladder wall thickening. Circumferential gas  about the periphery of the bladder lumen is favored to be mucosal. There is small volume intraluminal gas as well. Example 65/2. Stomach/Bowel: Gastric antral underdistention. Apparent wall thickening is likely secondary. Normal colon, appendix, and terminal ileum. Normal small bowel. Vascular/Lymphatic: Aortic atherosclerosis. No abdominopelvic adenopathy. Reproductive: Hysterectomy.  No adnexal mass. Other: No significant free fluid. No extracystic gas. No free intraperitoneal air. Small fat containing ventral abdominal wall hernia.  Musculoskeletal: Transitional S1 vertebral body. Degenerative disc disease at L4-5. IMPRESSION: 1. Marked bladder wall thickening with intraluminal and intramural gas, most consistent with emphysematous cystitis. 2.  Aortic Atherosclerosis (ICD10-I70.0). Electronically Signed   By: Rockey Kilts M.D.   On: 01/11/2024 15:50   CT ABDOMEN PELVIS WO CONTRAST Result Date: 01/10/2024 CLINICAL DATA:  Three day history of abdominal pain and nausea EXAM: CT ABDOMEN AND PELVIS WITHOUT CONTRAST TECHNIQUE: Multidetector CT imaging of the abdomen and pelvis was performed following the standard protocol without IV contrast. RADIATION DOSE REDUCTION: This exam was performed according to the departmental dose-optimization program which includes automated exposure control, adjustment of the mA and/or kV according to patient size and/or use of iterative reconstruction technique. COMPARISON:  CT abdomen and pelvis dated 11/11/2023 FINDINGS: Lower chest: No focal consolidation or pulmonary nodule in the lung bases. No pleural effusion or pneumothorax demonstrated. Partially imaged heart size is normal. Hepatobiliary: No focal hepatic lesions. No intra or extrahepatic biliary ductal dilation. Normal gallbladder. Pancreas: No focal lesions or main ductal dilation. Spleen: Normal in size without focal abnormality. Adrenals/Urinary Tract: No adrenal nodules. No suspicious renal mass on this noncontrast enhanced examination, calculi or hydronephrosis. Moderately distended urinary bladder demonstrates mild circumferential mural thickening. Stomach/Bowel: Mild mural thickening of the partially imaged distal esophagus. Normal appearance of the stomach. No evidence of bowel wall thickening, distention, or inflammatory changes. Colonic diverticulosis without acute diverticulitis. Normal appendix. Vascular/Lymphatic: Aortic atherosclerosis. No enlarged abdominal or pelvic lymph nodes. Reproductive: No adnexal masses. Other: No free fluid, fluid  collection, or free air. Musculoskeletal: No acute or abnormal lytic or blastic osseous lesions. Small fat-containing paraumbilical hernia. IMPRESSION: 1. Mild circumferential mural thickening of the partially imaged distal esophagus, which can be seen in the setting of esophagitis. 2. Moderately distended urinary bladder demonstrates mild circumferential mural thickening, which may be due to cystitis. Recommend correlation with urinalysis and urinary retention. 3. Colonic diverticulosis without acute diverticulitis. 4.  Aortic Atherosclerosis (ICD10-I70.0). Electronically Signed   By: Limin  Xu M.D.   On: 01/10/2024 10:26   DG ABD ACUTE 2+V W 1V CHEST Result Date: 01/10/2024 CLINICAL DATA:  One-week history of constipation and nausea EXAM: DG ABDOMEN ACUTE WITH 1 VIEW CHEST COMPARISON:  CT abdomen and pelvis dated 11/11/2023 FINDINGS: Lines/tubes: None. Chest: Lungs are clear without focal consolidation. No pneumothorax or pleural effusion. Normal heart size. Abdomen: Nonobstructive bowel gas pattern. No pneumatosis or free air. Moderate volume stool within the colon. No abnormal calcification. Pelvic soft tissue density, likely corresponding to distended urinary bladder. Bones: No acute osseous abnormality. IMPRESSION: 1. Nonobstructive bowel gas pattern. Moderate volume stool within the colon. 2. Pelvic soft tissue density, likely corresponding to distended urinary bladder. Recommend correlation with urinary retention. Electronically Signed   By: Limin  Xu M.D.   On: 01/10/2024 09:58   (Echo, Carotid, EGD, Colonoscopy, ERCP)    Subjective:  No complains  Discharge Exam: Vitals:   01/14/24 0244 01/14/24 0608  BP: 92/68 116/79  Pulse: 78 78  Resp: 16 15  Temp: 98.1 F (36.7 C) 98.2 F (  36.8 C)  SpO2: 96% 98%   Vitals:   01/13/24 2148 01/14/24 0244 01/14/24 0500 01/14/24 0608  BP: 106/66 92/68  116/79  Pulse: 82 78  78  Resp:  16  15  Temp:  98.1 F (36.7 C)  98.2 F (36.8 C)  TempSrc:   Oral  Oral  SpO2:  96%  98%  Weight:   53.7 kg   Height:        General: Pt is alert, awake, not in acute distress Cardiovascular: RRR, S1/S2 +, no rubs, no gallops Respiratory: CTA bilaterally, no wheezing, no rhonchi Abdominal: Soft, NT, ND, bowel sounds + Extremities: no edema, no cyanosis    The results of significant diagnostics from this hospitalization (including imaging, microbiology, ancillary and laboratory) are listed below for reference.     Microbiology: Recent Results (from the past 240 hours)  Resp panel by RT-PCR (RSV, Flu A&B, Covid) Anterior Nasal Swab     Status: None   Collection Time: 01/05/24  7:19 AM   Specimen: Anterior Nasal Swab  Result Value Ref Range Status   SARS Coronavirus 2 by RT PCR NEGATIVE NEGATIVE Final    Comment: (NOTE) SARS-CoV-2 target nucleic acids are NOT DETECTED.  The SARS-CoV-2 RNA is generally detectable in upper respiratory specimens during the acute phase of infection. The lowest concentration of SARS-CoV-2 viral copies this assay can detect is 138 copies/mL. A negative result does not preclude SARS-Cov-2 infection and should not be used as the sole basis for treatment or other patient management decisions. A negative result may occur with  improper specimen collection/handling, submission of specimen other than nasopharyngeal swab, presence of viral mutation(s) within the areas targeted by this assay, and inadequate number of viral copies(<138 copies/mL). A negative result must be combined with clinical observations, patient history, and epidemiological information. The expected result is Negative.  Fact Sheet for Patients:  bloggercourse.com  Fact Sheet for Healthcare Providers:  seriousbroker.it  This test is no t yet approved or cleared by the [place]  FDA and  has been authorized for detection and/or diagnosis of SARS-CoV-2 by FDA under an Emergency Use  Authorization (EUA). This EUA will remain  in effect (meaning this test can be used) for the duration of the COVID-19 declaration under Section 564(b)(1) of the Act, 21 U.S.C.section 360bbb-3(b)(1), unless the authorization is terminated  or revoked sooner.       Influenza A by PCR NEGATIVE NEGATIVE Final   Influenza B by PCR NEGATIVE NEGATIVE Final    Comment: (NOTE) The Xpert Xpress SARS-CoV-2/FLU/RSV plus assay is intended as an aid in the diagnosis of influenza from Nasopharyngeal swab specimens and should not be used as a sole basis for treatment. Nasal washings and aspirates are unacceptable for Xpert Xpress SARS-CoV-2/FLU/RSV testing.  Fact Sheet for Patients: bloggercourse.com  Fact Sheet for Healthcare Providers: seriousbroker.it  This test is not yet approved or cleared by the [place]  FDA and has been authorized for detection and/or diagnosis of SARS-CoV-2 by FDA under an Emergency Use Authorization (EUA). This EUA will remain in effect (meaning this test can be used) for the duration of the COVID-19 declaration under Section 564(b)(1) of the Act, 21 U.S.C. section 360bbb-3(b)(1), unless the authorization is terminated or revoked.     Resp Syncytial Virus by PCR NEGATIVE NEGATIVE Final    Comment: (NOTE) Fact Sheet for Patients: bloggercourse.com  Fact Sheet for Healthcare Providers: seriousbroker.it  This test is not yet approved or cleared by the [place]  FDA and  has been authorized for detection and/or diagnosis of SARS-CoV-2 by FDA under an Emergency Use Authorization (EUA). This EUA will remain in effect (meaning this test can be used) for the duration of the COVID-19 declaration under Section 564(b)(1) of the Act, 21 U.S.C. section 360bbb-3(b)(1), unless the authorization is terminated or revoked.  Performed at [facility],  [address], [place], [place] [address]   MRSA Next Gen by PCR, Nasal     Status: None   Collection Time: 01/05/24  3:07 PM   Specimen: Nasal Mucosa; Nasal Swab  Result Value Ref Range Status   MRSA by PCR Next Gen NOT DETECTED NOT DETECTED Final    Comment: (NOTE) The GeneXpert MRSA Assay (FDA approved for NASAL specimens only), is one component of a comprehensive MRSA colonization surveillance program. It is not intended to diagnose MRSA infection nor to guide or monitor treatment for MRSA infections. Test performance is not FDA approved in patients less than 54 years old. Performed at [facility], [address] [place], [place] [address]   Urine Culture     Status: Abnormal   Collection Time: 01/11/24  7:44 AM   Specimen: Urine, Random  Result Value Ref Range Status   Specimen Description   Final    URINE, RANDOM Performed at [facility], [address], [place], [place] [address]    Special Requests   Final    NONE Reflexed from [id number] Performed at [facility], [address] [place], [place] [address]    Culture >=100,000 COLONIES/mL ESCHERICHIA COLI (A)  Final   Report Status 01/14/2024 FINAL  Final   Organism ID, Bacteria ESCHERICHIA COLI (A)  Final      Susceptibility   Escherichia coli - MIC*    AMPICILLIN  4 SENSITIVE Sensitive     CEFAZOLIN  <=4 SENSITIVE Sensitive     CEFEPIME  <=0.12 SENSITIVE Sensitive     CEFTRIAXONE  <=0.25 SENSITIVE Sensitive     CIPROFLOXACIN  <=0.25 SENSITIVE Sensitive     GENTAMICIN <=1 SENSITIVE Sensitive     IMIPENEM <=0.25 SENSITIVE Sensitive     NITROFURANTOIN  64 INTERMEDIATE Intermediate     TRIMETH /SULFA  <=20 SENSITIVE Sensitive     AMPICILLIN /SULBACTAM <=2 SENSITIVE Sensitive     PIP/TAZO <=4 SENSITIVE Sensitive ug/mL    * >=100,000 COLONIES/mL ESCHERICHIA COLI  Blood culture (routine x 2)     Status: None (Preliminary result)   Collection Time: 01/11/24  8:25 PM    Specimen: BLOOD LEFT HAND  Result Value Ref Range Status   Specimen Description   Final    BLOOD LEFT HAND Performed at [facility], [address] [place], [place] [address]    Special Requests   Final    BOTTLES DRAWN AEROBIC AND ANAEROBIC Blood Culture results may not be optimal due to an inadequate volume of blood received in culture bottles Performed at [facility], [address], [place], [place] [address]    Culture   Final    NO GROWTH 2 DAYS Performed at [facility], [address] [place], [place] [address]    Report Status PENDING  Incomplete  Blood culture (routine x 2)     Status: None (Preliminary result)   Collection Time: 01/11/24  8:31 PM   Specimen: BLOOD RIGHT HAND  Result Value Ref Range Status   Specimen Description   Final    BLOOD RIGHT HAND Performed at [facility], [address], [place], [place] [address]    Special Requests   Final    BOTTLES DRAWN AEROBIC AND ANAEROBIC Blood Culture results may not be optimal due to an inadequate volume of blood received in culture bottles Performed at [facility], [address], [place], [place] [address]    Culture   Final    NO GROWTH 2 DAYS Performed at [facility], [address] [place], [place] [address]    Report Status PENDING  Incomplete     Labs: BNP (last 3 results) No results for input(s): BNP in the last 8760 hours. Basic Metabolic Panel: Recent Labs  Lab 01/10/24 0932 01/11/24 0732 01/12/24 0450  NA 136 133* 133*  K 4.0 3.9 4.0  CL 101 98 102  CO2  --  23 22  GLUCOSE 313* 233* 176*  BUN 26* 20 20  CREATININE 0.70 0.64 0.58  CALCIUM   --  9.0 8.3*   Liver Function Tests: Recent Labs  Lab 01/11/24 0732  AST 12*  ALT 12  ALKPHOS 67  BILITOT 0.7  PROT 7.4  ALBUMIN 4.0   No results for input(s): LIPASE, AMYLASE in the last 168 hours. No results for input(s): AMMONIA in the last 168 hours. CBC: Recent  Labs  Lab 01/10/24 0926 01/10/24 0932 01/11/24 0732 01/12/24 0450  WBC 8.4  --  11.7* 10.5  NEUTROABS 5.3  --  7.3  --   HGB 14.4 15.3* 14.8 13.0  HCT 45.6 45.0 44.8 39.9  MCV 87.2  --  85.7 85.8  PLT 307  --  390 318   Cardiac Enzymes: No results for input(s): CKTOTAL, CKMB, CKMBINDEX, TROPONINI in the last 168 hours. BNP: Invalid input(s): POCBNP CBG: Recent Labs  Lab 01/13/24 0941 01/13/24 1153 01/13/24 1732 01/13/24 2014 01/14/24 0755  GLUCAP 145* 179* 217* 131* 189*   D-Dimer No results for input(s): DDIMER in the last 72 hours. Hgb A1c No results for input(s): HGBA1C in the last 72 hours. Lipid Profile No results for input(s): CHOL, HDL, LDLCALC, TRIG, CHOLHDL, LDLDIRECT in the last 72 hours. Thyroid  function studies No results for input(s): TSH, T4TOTAL, T3FREE, THYROIDAB in the last 72 hours.  Invalid input(s): FREET3 Anemia work up No results for input(s): VITAMINB12, FOLATE, FERRITIN, TIBC, IRON, RETICCTPCT in the last 72 hours. Urinalysis    Component Value Date/Time   COLORURINE YELLOW 01/11/2024 0744   APPEARANCEUR CLOUDY (A) 01/11/2024 0744   LABSPEC 1.024 01/11/2024 0744   PHURINE 5.0 01/11/2024 0744   GLUCOSEU >=500 (A) 01/11/2024 0744   HGBUR LARGE (A) 01/11/2024 0744   BILIRUBINUR NEGATIVE 01/11/2024 0744   BILIRUBINUR negative 03/09/2021 1123   KETONESUR 20 (A) 01/11/2024 0744   PROTEINUR 100 (A) 01/11/2024 0744   UROBILINOGEN 0.2 03/09/2021 1123   UROBILINOGEN 0.2 10/28/2013 1723   NITRITE NEGATIVE 01/11/2024 0744   LEUKOCYTESUR SMALL (A) 01/11/2024 0744   Sepsis Labs Recent Labs  Lab 01/10/24 0926 01/11/24 0732 01/12/24 0450  WBC 8.4 11.7* 10.5   Microbiology Recent Results (from the past 240 hours)  Resp panel by RT-PCR (RSV, Flu A&B, Covid) Anterior Nasal Swab     Status: None   Collection Time: 01/05/24  7:19 AM   Specimen: Anterior Nasal Swab  Result Value Ref Range Status    SARS Coronavirus 2 by RT PCR NEGATIVE NEGATIVE Final    Comment: (NOTE) SARS-CoV-2 target nucleic acids are NOT DETECTED.  The SARS-CoV-2 RNA is generally detectable in upper respiratory specimens during the acute  phase of infection. The lowest concentration of SARS-CoV-2 viral copies this assay can detect is 138 copies/mL. A negative result does not preclude SARS-Cov-2 infection and should not be used as the sole basis for treatment or other patient management decisions. A negative result may occur with  improper specimen collection/handling, submission of specimen other than nasopharyngeal swab, presence of viral mutation(s) within the areas targeted by this assay, and inadequate number of viral copies(<138 copies/mL). A negative result must be combined with clinical observations, patient history, and epidemiological information. The expected result is Negative.  Fact Sheet for Patients:  bloggercourse.com  Fact Sheet for Healthcare Providers:  seriousbroker.it  This test is no t yet approved or cleared by the [place]  FDA and  has been authorized for detection and/or diagnosis of SARS-CoV-2 by FDA under an Emergency Use Authorization (EUA). This EUA will remain  in effect (meaning this test can be used) for the duration of the COVID-19 declaration under Section 564(b)(1) of the Act, 21 U.S.C.section 360bbb-3(b)(1), unless the authorization is terminated  or revoked sooner.       Influenza A by PCR NEGATIVE NEGATIVE Final   Influenza B by PCR NEGATIVE NEGATIVE Final    Comment: (NOTE) The Xpert Xpress SARS-CoV-2/FLU/RSV plus assay is intended as an aid in the diagnosis of influenza from Nasopharyngeal swab specimens and should not be used as a sole basis for treatment. Nasal washings and aspirates are unacceptable for Xpert Xpress SARS-CoV-2/FLU/RSV testing.  Fact Sheet for  Patients: bloggercourse.com  Fact Sheet for Healthcare Providers: seriousbroker.it  This test is not yet approved or cleared by the [place]  FDA and has been authorized for detection and/or diagnosis of SARS-CoV-2 by FDA under an Emergency Use Authorization (EUA). This EUA will remain in effect (meaning this test can be used) for the duration of the COVID-19 declaration under Section 564(b)(1) of the Act, 21 U.S.C. section 360bbb-3(b)(1), unless the authorization is terminated or revoked.     Resp Syncytial Virus by PCR NEGATIVE NEGATIVE Final    Comment: (NOTE) Fact Sheet for Patients: bloggercourse.com  Fact Sheet for Healthcare Providers: seriousbroker.it  This test is not yet approved or cleared by the [place]  FDA and has been authorized for detection and/or diagnosis of SARS-CoV-2 by FDA under an Emergency Use Authorization (EUA). This EUA will remain in effect (meaning this test can be used) for the duration of the COVID-19 declaration under Section 564(b)(1) of the Act, 21 U.S.C. section 360bbb-3(b)(1), unless the authorization is terminated or revoked.  Performed at [facility] [facility], [address], [place], [place] [address]   MRSA Next Gen by PCR, Nasal     Status: None   Collection Time: 01/05/24  3:07 PM   Specimen: Nasal Mucosa; Nasal Swab  Result Value Ref Range Status   MRSA by PCR Next Gen NOT DETECTED NOT DETECTED Final    Comment: (NOTE) The GeneXpert MRSA Assay (FDA approved for NASAL specimens only), is one component of a comprehensive MRSA colonization surveillance program. It is not intended to diagnose MRSA infection nor to guide or monitor treatment for MRSA infections. Test performance is not FDA approved in patients less than 51 years old. Performed at [facility], [address], [place], [place]  [address]   Urine Culture     Status: Abnormal   Collection Time: 01/11/24  7:44 AM   Specimen: Urine, Random  Result Value Ref Range Status   Specimen Description   Final    URINE,  RANDOM Performed at [facility], [address] [place], [place] [address]    Special Requests   Final    NONE Reflexed from [id number] Performed at [facility], [address], [place], [place] [address]    Culture >=100,000 COLONIES/mL ESCHERICHIA COLI (A)  Final   Report Status 01/14/2024 FINAL  Final   Organism ID, Bacteria ESCHERICHIA COLI (A)  Final      Susceptibility   Escherichia coli - MIC*    AMPICILLIN  4 SENSITIVE Sensitive     CEFAZOLIN  <=4 SENSITIVE Sensitive     CEFEPIME  <=0.12 SENSITIVE Sensitive     CEFTRIAXONE  <=0.25 SENSITIVE Sensitive     CIPROFLOXACIN  <=0.25 SENSITIVE Sensitive     GENTAMICIN <=1 SENSITIVE Sensitive     IMIPENEM <=0.25 SENSITIVE Sensitive     NITROFURANTOIN  64 INTERMEDIATE Intermediate     TRIMETH /SULFA  <=20 SENSITIVE Sensitive     AMPICILLIN /SULBACTAM <=2 SENSITIVE Sensitive     PIP/TAZO <=4 SENSITIVE Sensitive ug/mL    * >=100,000 COLONIES/mL ESCHERICHIA COLI  Blood culture (routine x 2)     Status: None (Preliminary result)   Collection Time: 01/11/24  8:25 PM   Specimen: BLOOD LEFT HAND  Result Value Ref Range Status   Specimen Description   Final    BLOOD LEFT HAND Performed at [facility], [address], [place], [place] [address]    Special Requests   Final    BOTTLES DRAWN AEROBIC AND ANAEROBIC Blood Culture results may not be optimal due to an inadequate volume of blood received in culture bottles Performed at [facility], [address], [place], [place] [address]    Culture   Final    NO GROWTH 2 DAYS Performed at [facility], [address], [place], [place] [address]    Report Status PENDING  Incomplete  Blood culture (routine x 2)     Status: None (Preliminary result)    Collection Time: 01/11/24  8:31 PM   Specimen: BLOOD RIGHT HAND  Result Value Ref Range Status   Specimen Description   Final    BLOOD RIGHT HAND Performed at [facility], [address] [place], [place] [address]    Special Requests   Final    BOTTLES DRAWN AEROBIC AND ANAEROBIC Blood Culture results may not be optimal due to an inadequate volume of blood received in culture bottles Performed at [facility], [address] [place], [place] [address]    Culture   Final    NO GROWTH 2 DAYS Performed at [facility], [address], [place], [place] [address]    Report Status PENDING  Incomplete     Time coordinating discharge: Over 35 minutes  SIGNED:   Erle Odell Castor, MD  [facility] 01/14/2024, 9:40 AM Pager   If 7PM-7AM, please contact night-coverage [email or web address] Password [email or web address]

## 2024-01-14 NOTE — TOC CM/SW Note (Signed)
 Transition of Care [facility]) - Inpatient Brief Assessment   Patient Details  Name: [name] [name] MRN: [id number] Date of Birth: 07/02/1961  Transition of Care [facility]) CM/SW Contact:    Tawni CHRISTELLA Eva, LCSW Phone Number: 01/14/2024, 9:57 AM   Clinical Narrative:  Patient has insurance and a PCP. Patient does not have any SDOH needs, no TOC needs at this time.   Transition of Care Asessment: Insurance and Status: Insurance coverage has been reviewed Patient has primary care physician: Yes Home environment has been reviewed: home with spouse Prior level of function:: indepednent Prior/Current Home Services: No current home services Social Drivers of Health Review: SDOH reviewed no interventions necessary Readmission risk has been reviewed: Yes Transition of care needs: no transition of care needs at this time

## 2024-01-15 NOTE — Progress Notes (Signed)
 01/15/2024 AIJALON [name] [id number] Aug 31, 1961   Chief Complaint: N/V, GERD  History of Present Illness:  [name] [name] is a 63 year old female with a past medical history of anxiety, hypertension, chronic diastolic CHF with LVEF 55%, DM, type I,  DKA, CKD stage II, peripheral neuropathy, hypothyroidism, esophagitis, H. Pylor in 2018, duodenal ulcer, Schatzki's ring, dysphagia, chronic nausea and pancreatitis. She is known by Dr. Wilhelmenia.   As previously reviewed at the time of her office visit 10/20/2023, she was seen by our inpatient GI service during her hospitalization 09/22/2023 secondary to having N/V and abdominal pain. She was hyperglycemic with leukocytosis. CTAP  showed a normal pancreas and circumferential esophageal thickening suggestive of esophagitis. She received IVF, insulin , antiemetics, PPI bid and Carafate . Abdominal MRI/MRCP showed mild biliary ductal dilatation with slight suspicion or a distal CBD and a 11mm soft tissue nodule in the gallbladder fundus, possible gallbladder polyp/neoplasm or focal adenomyosis. She underwent an EUS/EGD 09/24/2023 by Dr. Wilhelmenia. EGD showed severe esophagitis, duodenitis and nonbleeding duodenal ulcers, path report showed chronic duodenitis without evidence of H. Pylori. 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 Her clinical status stabilized and she was discharged home 09/24/2022 on Reglan  5mg  po tid prior to meals, Ondansetron  4mg  po Q 6 hrs and Pantoprazole  40mg  bid.  A repeat EGD to assess for esophagitis and duodenal ulcer healing was recommended in 3 to 4 months.  A follow up EGD was done 12/25/2023 which showed previous esophagitis and duodenal ulcer were healed.  Path report showed reactive gastropathy without evidence of H. pylori, intestinal metaplasia or dysplasia.  She was instructed to continue PPI twice daily for 6 months and Carafate  1 g p.o. 4 times daily for 1  month.  She presented to the ED 01/10/2024 diagnosed with a UTI, discharged home on antibiotics but did not pick them up.  She presented back to the ED 01/11/2024 with worsening abdominal pain.  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  Urology was consulted and a Foley catheter was placed.  She received IV Rocephin  and was discharged home 01/14/2024 on oral Bactrim  for 10 days.  Overall, she is feeling much better and she suspects her urinary bladder symptoms were ongoing for a while before being diagnosed with emphysematous cystitis. She remains on Bactrim  bid, to complete a total of 10 days as noted above.  Nausea has significantly improved.  She has a little nausea, takes Zofran  as needed with relief.  Prior marijuana use may have contributed to nausea as well. She has not needed Phenergan  suppository since she was discharged home. Acid reflux symptoms have significantly improved, infrequent heartburn.No dysphagia. She is trying to eat a healthier diet. Avoiding fast foods but enjoys eating Chik Filet once weekly. She has chronic constipation. She passes a normal brown  BM twice weekly. No rectal bleeding or black stools. She takes Miralax  once daily. She has urgent diarrhea once monthly, sometimes soils herself prior to getting to the bathroom, episodes occur about once monthly. No specific food or stress triggers.  She underwent a colonoscopy 05/25/2019 which showed diverticulosis in the entire colon, no polyps.  Denies NSAID use.     Latest Ref Rng & Units 01/12/2024    4:50 AM 01/11/2024    7:32 AM 01/10/2024    9:32 AM  CBC  WBC 4.0 - 10.5 K/uL 10.5  11.7    Hemoglobin 12.0 -  15.0 g/dL 86.9  85.1  84.6   Hematocrit 36.0 - 46.0 % 39.9  44.8  45.0   Platelets 150 - 400 K/uL 318  390         Latest Ref Rng & Units 01/12/2024    4:50 AM 01/11/2024    7:32 AM 01/10/2024    9:32 AM  CMP  Glucose 70 - 99 mg/dL 823  766  686   BUN 6 - 20 mg/dL 20   20  26    Creatinine 0.44 - 1.00 mg/dL 9.41  9.35  9.29   Sodium 135 - 145 mmol/L 133  133  136   Potassium 3.5 - 5.1 mmol/L 4.0  3.9  4.0   Chloride 98 - 111 mmol/L 102  98  101   CO2 22 - 32 mmol/L 22  23    Calcium  8.9 - 10.3 mg/dL 8.3  9.0    Total Protein 6.5 - 8.1 g/dL  7.4    Total Bilirubin 0.0 - 1.2 mg/dL  0.7    Alkaline Phos 38 - 126 U/L  67    AST 15 - 41 U/L  12    ALT 0 - 44 U/L  12      CTAP with contrast 01/11/2024: Lower chest: Clear lung bases. Normal heart size without pericardial or pleural effusion.   Hepatobiliary: Focal steatosis adjacent the falciform ligament. Hepatomegaly versus a Riedel's lobe at 20.5 cm. Normal gallbladder, without biliary ductal dilatation.   Pancreas: Normal, without mass or ductal dilatation.   Spleen: Normal in size, without focal abnormality.   Adrenals/Urinary Tract: Normal adrenal glands. Normal kidneys, without hydronephrosis.   There is marked bladder wall thickening. Circumferential gas about the periphery of the bladder lumen is favored to be mucosal. There is small volume intraluminal gas as well. Example 65/2.   Stomach/Bowel: Gastric antral underdistention. Apparent wall thickening is likely secondary. Normal colon, appendix, and terminal ileum. Normal small bowel.   Vascular/Lymphatic: Aortic atherosclerosis. No abdominopelvic adenopathy.   Reproductive: Hysterectomy.  No adnexal mass.   Other: No significant free fluid. No extracystic gas. No free intraperitoneal air.   Small fat containing ventral abdominal wall hernia.   Musculoskeletal: Transitional S1 vertebral body. Degenerative disc disease at L4-5.   IMPRESSION: 1. Marked bladder wall thickening with intraluminal and intramural gas, most consistent with emphysematous cystitis. 2.  Aortic Atherosclerosis     CTAP without contrast 01/10/2024: FINDINGS: Lower chest: No focal consolidation or pulmonary nodule in the lung bases. No pleural effusion or  pneumothorax demonstrated. Partially imaged heart size is normal.   Hepatobiliary: No focal hepatic lesions. No intra or extrahepatic biliary ductal dilation. Normal gallbladder.   Pancreas: No focal lesions or main ductal dilation.   Spleen: Normal in size without focal abnormality.   Adrenals/Urinary Tract: No adrenal nodules. No suspicious renal mass on this noncontrast enhanced examination, calculi or hydronephrosis. Moderately distended urinary bladder demonstrates mild circumferential mural thickening.   Stomach/Bowel: Mild mural thickening of the partially imaged distal esophagus. Normal appearance of the stomach. No evidence of bowel wall thickening, distention, or inflammatory changes. Colonic diverticulosis without acute diverticulitis. Normal appendix.   Vascular/Lymphatic: Aortic atherosclerosis. No enlarged abdominal or pelvic lymph nodes.   Reproductive: No adnexal masses.   Other: No free fluid, fluid collection, or free air.   Musculoskeletal: No acute or abnormal lytic or blastic osseous lesions. Small fat-containing paraumbilical hernia.   IMPRESSION: 1. Mild circumferential mural thickening of the partially imaged distal esophagus, which can  be seen in the setting of esophagitis. 2. Moderately distended urinary bladder demonstrates mild circumferential mural thickening, which may be due to cystitis. Recommend correlation with urinalysis and urinary retention. 3. Colonic diverticulosis without acute diverticulitis. 4.  Aortic Atherosclerosis    RUQ sono 09/12/2023: Gallbladder: No gallstones or wall thickening visualized. No sonographic Murphy sign noted by sonographer.   Common bile duct: Diameter: 5.7 mm mid segment, distally is 8 mm, but no more distended than previously.   Older CT of 05/10/2022 also demonstrated this. Today however, there is also mild intrahepatic biliary prominence. Laboratory and clinical correlation advised.   Liver: No  focal lesion identified. Within normal limits in parenchymal echogenicity. Portal vein is patent on color Doppler imaging with normal direction of blood flow towards the liver.   Other: No right upper quadrant ascites.   IMPRESSION: 1. Negative for gallstones. Negative for findings of acute cholecystitis. 2. Mild intrahepatic and extrahepatic biliary prominence. Laboratory and clinical correlation advised.   CTAP with contrast 09/22/2023:   Lower chest: Circumferential wall thickening in the distal esophagus suggesting esophagitis.   Hepatobiliary: No focal hepatic abnormality. Gallbladder unremarkable.   Pancreas: No focal abnormality or ductal dilatation.   Spleen: No focal abnormality.  Normal size.   Adrenals/Urinary Tract: No adrenal abnormality. No focal renal abnormality. No stones or hydronephrosis. Urinary bladder is unremarkable.   Stomach/Bowel: Scattered colonic diverticulosis. No active diverticulitis. Normal appendix. Stomach and small bowel unremarkable.   Vascular/Lymphatic: Scattered aortic calcifications. No evidence of aneurysm or adenopathy.   Reproductive: Prior hysterectomy.  No adnexal masses.   Other: No free fluid or free air.   Musculoskeletal: No acute bony abnormality.   IMPRESSION: Circumferential wall thickening in the visualized distal esophagus suggesting esophagitis.   Colonic diverticulosis.  No active diverticulitis.   Scattered aortic atherosclerosis.   Abdominal MRI/MRCP 09/24/2023: FINDINGS: Lower chest: No acute findings.   Hepatobiliary: No hepatic masses identified.   11 mm enhancing soft tissue nodule is seen in the gallbladder fundus which shows some T2 hypointensity and probable internal cystic foci. This may be due to a gallbladder polyp or focal adenomyosis.   Mild biliary ductal dilatation is seen with common bile duct measuring 8 mm in diameter. A meniscus sign is seen in the distal common bile duct at the  ampulla, raising suspicion for a distal common bile duct stone.   Pancreas: No pancreatic mass or inflammatory changes. No evidence of pancreatic ductal dilatation or pancreas divisum.   Spleen:  Within normal limits in size and appearance.   Adrenals/Urinary Tract: No suspicious masses identified. No evidence of hydronephrosis.   Stomach/Bowel: Unremarkable.   Vascular/Lymphatic: No pathologically enlarged lymph nodes identified. No acute vascular findings.   Other:  None.   Musculoskeletal:  No suspicious bone lesions identified.   IMPRESSION: Mild biliary ductal dilatation, with meniscus sign in the distal common bile duct at the ampulla, raising suspicion for a distal common bile duct stone.   11 mm enhancing soft tissue nodule in the gallbladder fundus, which may be due to a gallbladder polyp/neoplasm or focal adenomyosis. Recommend abdomen ultrasound for further evaluation.   No radiographic evidence pancreatitis or pancreas divisum.   GI PROCEDURES:  EGD 12/25/2023: - No gross lesions in the entire esophagus (previous esophagitis has healed). Z-line irregular, 38 cm from the incisors.  - Gastritis. Biopsied. - Congested duodenal mucosa - previous duodenal ulcers have healed. Path report showed reactive gastropathy, no evidence of H. Pylori or intestinal metaplasia    EGD/EUS  09/24/2023 as an inpatient: EGD impression:  - No gross lesions in the proximal esophagus.  - Severe LA Grade D erosive esophagitis with no bleeding was found from 28 to 39 cm. Biopsied.  - Z-line irregular, 39 cm from the incisors.  - Erythematous mucosa in the stomach. Biopsied. - Non-bleeding duodenal ulcers.  - Duodenitis. Biopsied.  - Congested major papilla. -Repeat EGD in 3 to 4 months    A. DUODENUM, BIOPSY:  -  Duodenal mucosa with prominent Brunner's glands and focal foveolar  metaplasia consistent with chronic peptic duodenitis.   B. STOMACH, BIOPSY:  -  Antral/oxyntic  mucosa with no significant pathology.  -  No Helicobacter pylori organisms identified on HE stained slide.   C. ESOPHAGUS, BIOPSY:  -  Predominantly fragments of squamous mucosa with reactive/reparative  change, focal fragments of submucosa with fibropurulent debris  suggestive of erosion/ulceration and a scant fragment of columnar mucosa  with reactive/reparative change (negative for intestinal metaplasia).    EUS impression:  - Pancreatic parenchymal abnormalities consisting of hyperechoic strands were noted in the pancreatic head, genu of the pancreas, pancreatic body and pancreatic tail.  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 The pancreatic duct itself was normal sized. There did not appear to be any evidence of any stenosis or strictures.  - There was dilation/prominence in the common bile duct and in the common hepatic duct. No evidence of choledocholithiasis.  - No malignant-appearing lymph nodes were visualized in the celiac region (level 20), peripancreatic region and porta hepatis region.   EGD 02/05/2023: - LA Grade C reflux esophagitis with no bleeding.  - Erythematous mucosa in the antrum. Biopsied.  - Erythematous duodenopathy. A. STOMACH, BIOPSY:  - ANTRAL MUCOSA WITH FOCAL REACTIVE/REPARATIVE CHANGES AND RARE,  ASSOCIATED ACUTE INFLAMMATION  - ANTRAL AND OXYNTIC MUCOSA WITH SLIGHT CHRONIC INFLAMMATION  - NEGATIVE FOR INTESTINAL METAPLASIA, DYSPLASIA OR MALIGNANCY    EGD 08/17/2020: LA Grade D esophagitis with no bleeding. Biopsied. The likely souce for bleeding and CT scan findings. - Erosive gastropathy with stigmata of recent bleeding. Biopsied - Erosive gastropathy with stigmata of recent bleeding. Biopsied. - Mild erythematous duodenopathy. - The examination was otherwise normal. A. STOMACH, RANDOM, BIOPSY:  - Mild reactive gastropathy with focal  intestinal metaplasia.  - Warthin-Starry is negative for Helicobacter pylori.  - No dysplasia, or malignancy.  B. ESOPHAGUS, DISTAL, BIOPSY:  - Acute esophagitis with ulcer debris.  The biopsies of your esophagus confirmed esophagitis and ulceration.  Pantoprazole  twice daily for at least 3 months. Using Carafate  up to 4 times daily for the next two weeks.   Small bowel enteroscopy 06/03/2019: - LA Grade D esophagitis. Biopsied. - Normal stomach. Biopsied. - Non-bleeding duodenal ulcers with no stigmata of bleeding. Biopsied. - The examined portion of the jejunum was normal.   Colonoscopy 05/25/2019: - Diverticulosis in the entire examined colon. - The examination was otherwise normal on direct and retroflexion views. - No polyps or cancers.  Current Outpatient Medications on File Prior to Visit  Medication Sig Dispense Refill   acetaminophen  (TYLENOL ) 500 MG tablet Take 1,000 mg by mouth every 6 (six) hours as needed for moderate pain (pain score 4-6).     aspirin  EC 81 MG tablet Take 81 mg by mouth daily. Swallow whole.     azelastine  (ASTELIN ) 0.1 % nasal spray Place 1 spray into both nostrils as needed for rhinitis or  allergies.     Continuous Blood Gluc Transmit (DEXCOM G6 TRANSMITTER) MISC USE AS DIRECTED FOR CONTINUOUS GLUCOSE MONITORING. REUSE TRANSMITTER X 90DAYS THEN DISCARD & REPLACE 1 each 3   escitalopram  (LEXAPRO ) 10 MG tablet Take 1 tablet (10 mg total) by mouth daily. 30 tablet 1   Glucagon, rDNA, (GLUCAGON EMERGENCY) 1 MG KIT Inject 1 mg into the skin See admin instructions. Inject one mg into the skin see administration instructions. Follow package directions for low blood sugar.     levothyroxine  (SYNTHROID ) 137 MCG tablet Take 137 mcg by mouth daily before breakfast.     metoCLOPramide  (REGLAN ) 5 MG tablet Take 1 tablet (5 mg total) by mouth 3 (three) times daily before meals. 90 tablet 2   NOVOLOG  FLEXPEN 100 UNIT/ML FlexPen Inject 10 Units into the skin 3 (three) times  daily with meals. Plus sliding scale TID 15 mL 0   omega-3 acid ethyl esters (LOVAZA ) 1 g capsule Take 2 capsules (2 g total) by mouth 2 (two) times daily. 120 capsule 4   ondansetron  (ZOFRAN ) 4 MG tablet Take 2 tablets (8 mg total) by mouth every 8 (eight) hours as needed for nausea or vomiting. 30 tablet 2   ondansetron  (ZOFRAN -ODT) 8 MG disintegrating tablet Take 1 tablet (8 mg total) by mouth every 8 (eight) hours as needed for nausea or vomiting. 20 tablet 0   QUEtiapine  (SEROQUEL ) 50 MG tablet Take 50 mg by mouth at bedtime.     rosuvastatin  (CRESTOR ) 10 MG tablet Take 1 tablet (10 mg total) by mouth daily. 30 tablet 1   sucralfate  (CARAFATE ) 1 g tablet Take 1 tablet (1 g total) by mouth 2 (two) times daily. Do not take any other medication within 2-4 hours. 60 tablet 1   sulfamethoxazole -trimethoprim  (BACTRIM  DS) 800-160 MG tablet Take 1 tablet by mouth every 12 (twelve) hours for 10 days. 20 tablet 0   tamsulosin  (FLOMAX ) 0.4 MG CAPS capsule Take 1 capsule (0.4 mg total) by mouth daily after supper. 30 capsule 6   TOUJEO  SOLOSTAR 300 UNIT/ML Solostar Pen Inject 25 Units into the skin at bedtime. 15 mL 1   traMADol  (ULTRAM ) 50 MG tablet 1 to 2 tablets every 6 hours with extra strength Tylenol  for pain as needed 20 tablet 0   traZODone  (DESYREL ) 50 MG tablet Take 50 mg by mouth at bedtime as needed for sleep.     No current facility-administered medications on file prior to visit.   Allergies  Allergen Reactions   Codeine Hives, Anxiety and Other (See Comments)    Current Medications, Allergies, Past Medical History, Past Surgical History, Family History and Social History were reviewed in [employer] record.  Review of Systems:   Constitutional: Negative for fever, sweats, chills or weight loss.  Respiratory: Negative for shortness of breath.   Cardiovascular: Negative for chest pain, palpitations and leg swelling.  Gastrointestinal: See HPI.  Musculoskeletal:  Negative for back pain or muscle aches.  Neurological: Negative for dizziness, headaches or paresthesias.   Physical Exam: BP (!) 88/56   Pulse 87   Ht 4' 11 (1.499 m)   Wt 124 lb 6 oz (56.4 kg)   LMP 08/11/2012   BMI 25.12 kg/m   Wt Readings from Last 3 Encounters:  01/16/24 124 lb 6 oz (56.4 kg)  01/14/24 118 lb 6.2 oz (53.7 kg)  01/05/24 118 lb 2.7 oz (53.6 kg)   LMP 08/11/2012  General: 63 year old female in no acute distress. Head: Normocephalic and  atraumatic. Eyes: No scleral icterus. Conjunctiva pink . Ears: Normal auditory acuity. Mouth: Poor dentition.  No ulcers or lesions.  Lungs: Clear throughout to auscultation. Heart: Regular rate and rhythm, no murmur. Abdomen: Soft, nondistended.  Mild tenderness above the umbilicus without rebound or guarding.  No masses or hepatomegaly. Normal bowel sounds x 4 quadrants.  Rectal: Deferred.  Musculoskeletal: Symmetrical with no gross deformities. Extremities: No edema. Neurological: Alert oriented x 4. No focal deficits.  Psychological: Alert and cooperative. Normal mood and affect  Assessment and Recommendations:  63 year old female with GERD, severe esophagitis and nonbleeding duodenal ulcer per EGD 09/2023. Remains on PPI bid. Completed Carafate  1 gm po tid x 1 month post EGD. Follow up EGD 12/25/2023 showed prior esophagitis and duodenal ulcer have healed.  -GERD diet -Continue Pantoprazole  40 mg twice daily -Follow-up in office in 3 to 4 months -Patient to contact office if she develops active GERD symptoms or abdominal pain  N/V, multifactorial: Poorly controlled diabetes, severe esophagitis, cystitis and prior marijuana use.  Nausea well-controlled at this time, no recent vomiting.  Takes Zofran  as needed.  Has not recently required Phenergan  suppository.  -Zofran  and Phenergan  suppositories as needed as previously prescribed -Continue marijuana abstinence  Colon cancer screening.  Colonoscopy 05/2019 showed  diverticulosis throughout the colon, no polyps. -Next screening colonoscopy due 05/2029   Diabetes mellitus -Follow up with endocrinologist

## 2024-01-16 ENCOUNTER — Ambulatory Visit: Payer: BC Managed Care – PPO | Admitting: Nurse Practitioner

## 2024-01-16 ENCOUNTER — Encounter: Payer: Self-pay | Admitting: Nurse Practitioner

## 2024-01-16 VITALS — BP 88/56 | HR 87 | Ht 59.0 in | Wt 124.4 lb

## 2024-01-16 DIAGNOSIS — K5900 Constipation, unspecified: Secondary | ICD-10-CM

## 2024-01-16 DIAGNOSIS — K219 Gastro-esophageal reflux disease without esophagitis: Secondary | ICD-10-CM

## 2024-01-16 MED ORDER — PANTOPRAZOLE SODIUM 40 MG PO TBEC: 40.0000 mg | DELAYED_RELEASE_TABLET | Freq: Two times a day (BID) | ORAL | 1 refills | Status: DC

## 2024-01-16 NOTE — Progress Notes (Signed)
 Attending Physician's Attestation   I have reviewed the chart.   I agree with the Advanced Practitioner's note, impression, and recommendations with any updates as below.    [name], MD [facility] Office # [phone]

## 2024-01-16 NOTE — Patient Instructions (Addendum)
 We have sent the following medications to your pharmacy for you to pick up at your convenience: Pantoprazole   Take Miralax  once or twice daily as needed   Follow up in 4 months  If your blood pressure at your visit was 140/90 or greater, please contact your primary care physician to follow up on this.  _______________________________________________________  If you are age 61 or older, your body mass index should be between 23-30. Your Body mass index is 25.12 kg/m. If this is out of the aforementioned range listed, please consider follow up with your Primary Care Provider.  If you are age 60 or younger, your body mass index should be between 19-25. Your Body mass index is 25.12 kg/m. If this is out of the aformentioned range listed, please consider follow up with your Primary Care Provider.   ________________________________________________________  The [facility] providers would like to encourage you to use MYCHART to communicate with providers for non-urgent requests or questions.  Due to long hold times on the telephone, sending your provider a message by [facility] may be a faster and more efficient way to get a response.  Please allow 48 business hours for a response.  Please remember that this is for non-urgent requests.  _______________________________________________________   Thank you for trusting me with your gastrointestinal care!   Elida Shawl, CRNP

## 2024-01-17 LAB — CULTURE, BLOOD (ROUTINE X 2)
Culture: NO GROWTH
Culture: NO GROWTH

## 2024-01-21 ENCOUNTER — Encounter (HOSPITAL_COMMUNITY): Payer: Self-pay

## 2024-01-21 ENCOUNTER — Inpatient Hospital Stay (HOSPITAL_COMMUNITY)
Admission: EM | Admit: 2024-01-21 | Discharge: 2024-01-26 | DRG: 439 | Disposition: A | Discharge status: Home / Self Care | Payer: BC Managed Care – PPO | Attending: Internal Medicine | Admitting: Internal Medicine

## 2024-01-21 ENCOUNTER — Emergency Department (HOSPITAL_COMMUNITY): Payer: BC Managed Care – PPO

## 2024-01-21 DIAGNOSIS — Z833 Family history of diabetes mellitus: Secondary | ICD-10-CM

## 2024-01-21 DIAGNOSIS — F419 Anxiety disorder, unspecified: Secondary | ICD-10-CM | POA: Diagnosis present

## 2024-01-21 DIAGNOSIS — F32A Depression, unspecified: Secondary | ICD-10-CM | POA: Diagnosis present

## 2024-01-21 DIAGNOSIS — I2510 Atherosclerotic heart disease of native coronary artery without angina pectoris: Secondary | ICD-10-CM | POA: Diagnosis present

## 2024-01-21 DIAGNOSIS — K279 Peptic ulcer, site unspecified, unspecified as acute or chronic, without hemorrhage or perforation: Secondary | ICD-10-CM | POA: Diagnosis present

## 2024-01-21 DIAGNOSIS — K859 Acute pancreatitis without necrosis or infection, unspecified: Secondary | ICD-10-CM | POA: Diagnosis present

## 2024-01-21 DIAGNOSIS — E1042 Type 1 diabetes mellitus with diabetic polyneuropathy: Secondary | ICD-10-CM | POA: Diagnosis present

## 2024-01-21 DIAGNOSIS — M810 Age-related osteoporosis without current pathological fracture: Secondary | ICD-10-CM | POA: Diagnosis present

## 2024-01-21 DIAGNOSIS — I130 Hypertensive heart and chronic kidney disease with heart failure and stage 1 through stage 4 chronic kidney disease, or unspecified chronic kidney disease: Secondary | ICD-10-CM | POA: Diagnosis present

## 2024-01-21 DIAGNOSIS — Z794 Long term (current) use of insulin: Secondary | ICD-10-CM | POA: Diagnosis not present

## 2024-01-21 DIAGNOSIS — I2583 Coronary atherosclerosis due to lipid rich plaque: Secondary | ICD-10-CM | POA: Diagnosis not present

## 2024-01-21 DIAGNOSIS — Z79899 Other long term (current) drug therapy: Secondary | ICD-10-CM | POA: Diagnosis not present

## 2024-01-21 DIAGNOSIS — K219 Gastro-esophageal reflux disease without esophagitis: Secondary | ICD-10-CM | POA: Diagnosis present

## 2024-01-21 DIAGNOSIS — K8500 Idiopathic acute pancreatitis without necrosis or infection: Secondary | ICD-10-CM | POA: Diagnosis not present

## 2024-01-21 DIAGNOSIS — R1013 Epigastric pain: Principal | ICD-10-CM

## 2024-01-21 DIAGNOSIS — Z820 Family history of epilepsy and other diseases of the nervous system: Secondary | ICD-10-CM

## 2024-01-21 DIAGNOSIS — Z8249 Family history of ischemic heart disease and other diseases of the circulatory system: Secondary | ICD-10-CM | POA: Diagnosis not present

## 2024-01-21 DIAGNOSIS — I5032 Chronic diastolic (congestive) heart failure: Secondary | ICD-10-CM | POA: Diagnosis present

## 2024-01-21 DIAGNOSIS — E1065 Type 1 diabetes mellitus with hyperglycemia: Secondary | ICD-10-CM | POA: Diagnosis present

## 2024-01-21 DIAGNOSIS — E1022 Type 1 diabetes mellitus with diabetic chronic kidney disease: Secondary | ICD-10-CM | POA: Diagnosis present

## 2024-01-21 DIAGNOSIS — Z79890 Hormone replacement therapy (postmenopausal): Secondary | ICD-10-CM | POA: Diagnosis not present

## 2024-01-21 DIAGNOSIS — I480 Paroxysmal atrial fibrillation: Secondary | ICD-10-CM | POA: Diagnosis present

## 2024-01-21 DIAGNOSIS — K3184 Gastroparesis: Secondary | ICD-10-CM | POA: Diagnosis present

## 2024-01-21 DIAGNOSIS — Z87891 Personal history of nicotine dependence: Secondary | ICD-10-CM | POA: Diagnosis not present

## 2024-01-21 DIAGNOSIS — E1043 Type 1 diabetes mellitus with diabetic autonomic (poly)neuropathy: Secondary | ICD-10-CM | POA: Diagnosis present

## 2024-01-21 DIAGNOSIS — Z885 Allergy status to narcotic agent status: Secondary | ICD-10-CM

## 2024-01-21 DIAGNOSIS — K589 Irritable bowel syndrome without diarrhea: Secondary | ICD-10-CM | POA: Diagnosis present

## 2024-01-21 DIAGNOSIS — E785 Hyperlipidemia, unspecified: Secondary | ICD-10-CM | POA: Diagnosis present

## 2024-01-21 DIAGNOSIS — N182 Chronic kidney disease, stage 2 (mild): Secondary | ICD-10-CM | POA: Diagnosis present

## 2024-01-21 DIAGNOSIS — N390 Urinary tract infection, site not specified: Secondary | ICD-10-CM | POA: Diagnosis present

## 2024-01-21 DIAGNOSIS — E039 Hypothyroidism, unspecified: Secondary | ICD-10-CM | POA: Diagnosis present

## 2024-01-21 DIAGNOSIS — Z7982 Long term (current) use of aspirin: Secondary | ICD-10-CM | POA: Diagnosis not present

## 2024-01-21 DIAGNOSIS — E109 Type 1 diabetes mellitus without complications: Secondary | ICD-10-CM | POA: Diagnosis present

## 2024-01-21 DIAGNOSIS — Z91148 Patient's other noncompliance with medication regimen for other reason: Secondary | ICD-10-CM

## 2024-01-21 DIAGNOSIS — R112 Nausea with vomiting, unspecified: Secondary | ICD-10-CM | POA: Diagnosis not present

## 2024-01-21 LAB — COMPREHENSIVE METABOLIC PANEL
ALT: 15 U/L (ref 0–44)
AST: 16 U/L (ref 15–41)
Albumin: 4 g/dL (ref 3.5–5.0)
Alkaline Phosphatase: 60 U/L (ref 38–126)
Anion gap: 12 (ref 5–15)
BUN: 14 mg/dL (ref 6–20)
CO2: 27 mmol/L (ref 22–32)
Calcium: 9.2 mg/dL (ref 8.9–10.3)
Chloride: 97 mmol/L — ABNORMAL LOW (ref 98–111)
Creatinine, Ser: 0.76 mg/dL (ref 0.44–1.00)
GFR, Estimated: >60 mL/min (ref >60)
Glucose, Bld: 286 mg/dL — ABNORMAL HIGH (ref 70–99)
Potassium: 3.7 mmol/L (ref 3.5–5.1)
Sodium: 136 mmol/L (ref 135–145)
Total Bilirubin: 0.6 mg/dL (ref 0.0–1.2)
Total Protein: 7.2 g/dL (ref 6.5–8.1)

## 2024-01-21 LAB — URINALYSIS, ROUTINE W REFLEX MICROSCOPIC
Bacteria, UA: NONE SEEN
Bilirubin Urine: NEGATIVE
Glucose, UA: >=500 mg/dL — AB
Hgb urine dipstick: NEGATIVE
Ketones, ur: 5 mg/dL — AB
Leukocytes,Ua: NEGATIVE
Nitrite: NEGATIVE
Protein, ur: NEGATIVE mg/dL
Specific Gravity, Urine: 1.02 (ref 1.005–1.030)
pH: 8 (ref 5.0–8.0)

## 2024-01-21 LAB — CBC
HCT: 42.7 % (ref 36.0–46.0)
Hemoglobin: 13.8 g/dL (ref 12.0–15.0)
MCH: 27.5 pg (ref 26.0–34.0)
MCHC: 32.3 g/dL (ref 30.0–36.0)
MCV: 85.2 fL (ref 80.0–100.0)
Platelets: 384 10*3/uL (ref 150–400)
RBC: 5.01 MIL/uL (ref 3.87–5.11)
RDW: 15.3 % (ref 11.5–15.5)
WBC: 9.4 10*3/uL (ref 4.0–10.5)
nRBC: 0 % (ref 0.0–0.2)

## 2024-01-21 LAB — LIPASE, BLOOD: Lipase: 125 U/L — ABNORMAL HIGH (ref 11–51)

## 2024-01-21 MED ORDER — HYDROMORPHONE HCL 1 MG/ML IJ SOLN
1.0000 mg | Freq: Once | INTRAMUSCULAR | Status: AC
  Administered 2024-01-21: 1 mg via INTRAVENOUS
  Filled 2024-01-21: qty 1

## 2024-01-21 MED ORDER — ONDANSETRON HCL 4 MG PO TABS: 4.0000 mg | ORAL_TABLET | Freq: Four times a day (QID) | ORAL | Status: DC | PRN

## 2024-01-21 MED ORDER — ONDANSETRON 4 MG PO TBDP
4.0000 mg | ORAL_TABLET | Freq: Once | ORAL | Status: AC
  Administered 2024-01-21: 4 mg via ORAL
  Filled 2024-01-21: qty 1

## 2024-01-21 MED ORDER — ENOXAPARIN SODIUM 40 MG/0.4ML IJ SOSY
40.0000 mg | PREFILLED_SYRINGE | INTRAMUSCULAR | Status: DC
  Administered 2024-01-22 – 2024-01-26 (×5): 40 mg via SUBCUTANEOUS
  Filled 2024-01-21 (×5): qty 0.4

## 2024-01-21 MED ORDER — HYDROMORPHONE HCL 1 MG/ML IJ SOLN
1.0000 mg | Freq: Once | INTRAMUSCULAR | Status: DC
  Filled 2024-01-21: qty 1

## 2024-01-21 MED ORDER — ONDANSETRON HCL 4 MG/2ML IJ SOLN
4.0000 mg | Freq: Once | INTRAMUSCULAR | Status: AC
Start: 2024-01-21 — End: 2024-01-21
  Administered 2024-01-21: 4 mg via INTRAVENOUS
  Filled 2024-01-21: qty 2

## 2024-01-21 MED ORDER — ONDANSETRON HCL 4 MG/2ML IJ SOLN
4.0000 mg | Freq: Four times a day (QID) | INTRAMUSCULAR | Status: DC | PRN
  Administered 2024-01-21 – 2024-01-25 (×7): 4 mg via INTRAVENOUS
  Filled 2024-01-21 (×7): qty 2

## 2024-01-21 MED ORDER — INSULIN ASPART 100 UNIT/ML IJ SOLN
0.0000 [IU] | Freq: Three times a day (TID) | INTRAMUSCULAR | Status: DC
  Administered 2024-01-22: 1 [IU] via SUBCUTANEOUS
  Administered 2024-01-22: 4 [IU] via SUBCUTANEOUS
  Administered 2024-01-22 – 2024-01-24 (×3): 1 [IU] via SUBCUTANEOUS
  Administered 2024-01-26: 3 [IU] via SUBCUTANEOUS
  Filled 2024-01-21: qty 0.06

## 2024-01-21 MED ORDER — DEXTROSE IN LACTATED RINGERS 5 % IV SOLN: INTRAVENOUS | Status: DC

## 2024-01-21 MED ORDER — INSULIN ASPART 100 UNIT/ML IJ SOLN
0.0000 [IU] | Freq: Every day | INTRAMUSCULAR | Status: DC
  Administered 2024-01-22: 4 [IU] via SUBCUTANEOUS
  Filled 2024-01-21: qty 0.05
  Filled 2024-01-21: qty 5

## 2024-01-21 MED ORDER — IOHEXOL 300 MG/ML  SOLN
100.0000 mL | Freq: Once | INTRAMUSCULAR | Status: AC | PRN
  Administered 2024-01-21: 100 mL via INTRAVENOUS

## 2024-01-21 MED ORDER — HYDROMORPHONE HCL 1 MG/ML IJ SOLN
1.0000 mg | INTRAMUSCULAR | Status: DC | PRN
  Administered 2024-01-21 – 2024-01-25 (×18): 1 mg via INTRAVENOUS
  Filled 2024-01-21 (×18): qty 1

## 2024-01-21 MED ORDER — OXYCODONE-ACETAMINOPHEN 5-325 MG PO TABS
1.0000 | ORAL_TABLET | Freq: Once | ORAL | Status: AC
  Administered 2024-01-21: 1 via ORAL
  Filled 2024-01-21: qty 1

## 2024-01-21 NOTE — ED Provider Notes (Signed)
[place] EMERGENCY DEPARTMENT AT [facility] Provider Note  CSN: [id number] Arrival date & time: 01/21/24 1300  Chief Complaint(s) Abdominal Pain  HPI [name] [name] is a 63 y.o. female here today for abdominal pain.  Patient recently admitted for emphysematous cystitis, discharged on Bactrim.  Patient has been compliant with her medications.  Says that today, had difficulty urinating, increased pain in her epigastric region, vomiting.   Past Medical History Past Medical History:  Diagnosis Date   Allergy    seasonal   Anxiety    B12 deficiency    Chest pain 04/05/2017   CHF (congestive heart failure) ([name])    Diabetes mellitus type 1, uncontrolled    "dr. 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   Polycythemia 01/05/2024   Diabetic ketoacidosis associated with type 1 diabetes mellitus ([name]) 12/07/2023   Depression 11/12/2023   Intractable abdominal pain 10/26/2023   Gall bladder polyp 10/25/2023   Peptic ulcer disease 10/25/2023   Bile duct abnormality 09/24/2023   Gastritis and gastroduodenitis 09/24/2023   Multiple duodenal ulcers 09/24/2023   Esophagitis 09/23/2023   Abdominal pain, bilateral upper quadrant 09/23/2023   Intractable nausea 09/22/2023   Hyperglycemia 09/02/2023   DKA (diabetic ketoacidosis) ([name]) 08/01/2023   Intractable vomiting with nausea 06/14/2023   non obstructive CAD (coronary artery disease)  05/14/2023   Urinary retention 05/14/2023   Uncontrolled type 1 diabetes mellitus with hyperglycemia, with long-term current use of insulin ([name]) 05/14/2023   UTI (urinary tract infection) 05/14/2023   Abnormal CT scan, esophagus 02/05/2023   Gastroparesis 01/07/2023   History of hypotension 10/18/2022   Metabolic alkalosis 10/18/2022   Dyslipidemia 10/18/2022   Chronic pain of both knees 08/20/2022   Intractable nausea and vomiting 03/27/2022   Marijuana abuse 03/27/2022   Bladder wall thickening 03/09/2021   Chronic orthostatic hypotension 03/06/2021   Gastric erosion    Urinary frequency 02/10/2020   Chronic diastolic HF (heart failure) ([name]) 10/12/2019   Duodenal ulcer without hemorrhage or perforation    Acute esophagitis    Duodenitis    Constipation    Nausea and vomiting 03/25/2019   MDD (major depressive disorder), recurrent episode, moderate ([name]) 07/01/2018   Dehydration 05/26/2018   Chronic chest pain    Noncompliance 01/14/2018   Migraines 12/11/2017   Phrygian cap 06/20/2017   Leukocytosis    Hypokalemia 02/15/2017   NSTEMI (non-ST elevated myocardial infarction) ([name]) 02/11/2017   Paroxysmal A-fib ([name]) 02/01/2015   Kidney disease, chronic, 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 Authorizing Provider  acetaminophen (TYLENOL) 500 MG tablet Take 1,000  mg by mouth every 6 (six) hours as needed for moderate pain (pain score 4-6).    [provider]  aspirin EC 81 MG tablet Take 81 mg by mouth daily. Swallow whole.    [provider]  azelastine (ASTELIN) 0.1 % nasal spray Place 1 spray into both nostrils as needed for rhinitis or  allergies. 11/04/23   [provider]  Continuous Blood Gluc Transmit (DEXCOM G6 TRANSMITTER) MISC USE AS DIRECTED FOR CONTINUOUS GLUCOSE MONITORING. REUSE TRANSMITTER X 90DAYS THEN DISCARD & REPLACE 07/05/21   Bedsole, Amy E, MD  escitalopram (LEXAPRO) 10 MG tablet Take 1 tablet (10 mg total) by mouth daily. 11/18/23   Rodolph Bong, MD  Glucagon, rDNA, (GLUCAGON EMERGENCY) 1 MG KIT Inject 1 mg into the skin See admin instructions. Inject one mg into the skin see administration instructions. Follow package directions for low blood sugar. 06/18/23   [provider]  levothyroxine (SYNTHROID) 137 MCG tablet Take 137 mcg by mouth daily before breakfast. 03/19/22   [provider]  metoCLOPramide (REGLAN) 5 MG tablet Take 1 tablet (5 mg total) by mouth 3 (three) times daily before meals. 11/12/23   Danford, Earl Lites, MD  NOVOLOG FLEXPEN 100 UNIT/ML FlexPen Inject 10 Units into the skin 3 (three) times daily with meals. Plus sliding scale TID 12/08/23   Azucena Fallen, MD  omega-3 acid ethyl esters (LOVAZA) 1 g capsule Take 2 capsules (2 g total) by mouth 2 (two) times daily. 09/13/23   Alwyn Ren, MD  ondansetron (ZOFRAN) 4 MG tablet Take 2 tablets (8 mg total) by mouth every 8 (eight) hours as needed for nausea or vomiting. 12/25/23   Mansouraty, Netty Starring., MD  ondansetron (ZOFRAN-ODT) 8 MG disintegrating tablet Take 1 tablet (8 mg total) by mouth every 8 (eight) hours as needed for nausea or vomiting. 01/10/24   Lorre Nick, MD  pantoprazole (PROTONIX) 40 MG tablet Take 1 tablet (40 mg total) by mouth 2 (two) times daily. Take 30 minutes before breakfast and dinner 01/16/24   Arnaldo Natal, NP  QUEtiapine (SEROQUEL) 50 MG tablet Take 50 mg by mouth at bedtime. 09/09/23   [provider]  rosuvastatin (CRESTOR) 10 MG tablet Take 1 tablet (10 mg total) by mouth daily. 11/18/23   Rodolph Bong, MD  sucralfate (CARAFATE) 1 g tablet Take 1  tablet (1 g total) by mouth 2 (two) times daily. Do not take any other medication within 2-4 hours. 12/25/23   Mansouraty, Netty Starring., MD  sulfamethoxazole-trimethoprim (BACTRIM DS) 800-160 MG tablet Take 1 tablet by mouth every 12 (twelve) hours for 10 days. 01/14/24 01/24/24  Marinda Elk, MD  tamsulosin (FLOMAX) 0.4 MG CAPS capsule Take 1 capsule (0.4 mg total) by mouth daily after supper. 01/06/24   Danford, Earl Lites, MD  TOUJEO SOLOSTAR 300 UNIT/ML Solostar Pen Inject 25 Units into the skin at bedtime. 12/08/23   Azucena Fallen, MD  traMADol Janean Sark) 50 MG tablet 1 to 2 tablets every 6 hours with extra strength Tylenol for pain as needed 12/12/23   Arby Barrette, MD  traZODone (DESYREL) 50 MG tablet Take 50 mg by mouth at bedtime as needed for sleep.    [provider]  Past Surgical History Past Surgical History:  Procedure Laterality Date   BIOPSY  06/03/2019   Procedure: BIOPSY;  Surgeon: Lynann Bologna, MD;  Location: [facility];  Service: Endoscopy;;   BIOPSY  08/17/2020   Procedure: BIOPSY;  Surgeon: Tressia Danas, MD;  Location: [facility];  Service: Gastroenterology;;   BIOPSY  02/05/2023   Procedure: BIOPSY;  Surgeon: Meryl Dare, MD;  Location: [facility];  Service: Gastroenterology;;   BIOPSY  09/24/2023   Procedure: BIOPSY;  Surgeon: Lemar Lofty., MD;  Location: [facility];  Service: Gastroenterology;;   CESAREAN SECTION  1989   ENTEROSCOPY N/A 06/03/2019   Procedure: ENTEROSCOPY;  Surgeon: Lynann Bologna, MD;  Location: [facility];  Service: Endoscopy;  Laterality: N/A;   ESOPHAGOGASTRODUODENOSCOPY (EGD) WITH PROPOFOL N/A 08/17/2020   Procedure: ESOPHAGOGASTRODUODENOSCOPY (EGD) WITH PROPOFOL;  Surgeon: Tressia Danas, MD;  Location: [facility];  Service: Gastroenterology;  Laterality: N/A;    ESOPHAGOGASTRODUODENOSCOPY (EGD) WITH PROPOFOL N/A 02/05/2023   Procedure: ESOPHAGOGASTRODUODENOSCOPY (EGD) WITH PROPOFOL;  Surgeon: Meryl Dare, MD;  Location: [facility];  Service: Gastroenterology;  Laterality: N/A;   ESOPHAGOGASTRODUODENOSCOPY (EGD) WITH PROPOFOL N/A 09/24/2023   Procedure: ESOPHAGOGASTRODUODENOSCOPY (EGD) WITH PROPOFOL;  Surgeon: Meridee Score Netty Starring., MD;  Location: [facility];  Service: Gastroenterology;  Laterality: N/A;   EUS N/A 09/24/2023   Procedure: UPPER ENDOSCOPIC ULTRASOUND (EUS) LINEAR;  Surgeon: Lemar Lofty., MD;  Location: [facility];  Service: Gastroenterology;  Laterality: N/A;   LAPAROSCOPIC ASSISTED VAGINAL HYSTERECTOMY  09/23/2012   Procedure: LAPAROSCOPIC ASSISTED VAGINAL HYSTERECTOMY;  Surgeon: Mitchel Honour, DO;  Location: [facility];  Service: Gynecology;  Laterality: N/A;  pull Dr Vance Gather instrument   LEFT HEART CATH AND CORONARY ANGIOGRAPHY N/A 02/11/2017   Procedure: Left Heart Cath and Coronary Angiography;  Surgeon: Runell Gess, MD;  Location: [facility] INVASIVE CV LAB;  Service: Cardiovascular;  Laterality: N/A;   TUBAL LIGATION Bilateral 1992   Family History Family History  Problem Relation Age of Onset   Heart failure Mother 22       Her heart skipped a beat and stopped - per pt   Heart failure Brother    Diabetes Brother    Diabetes Maternal Grandmother    Alzheimer's disease Maternal Grandmother    Coronary artery disease Maternal Grandmother    Heart attack Maternal Grandfather    Diabetes Other    Heart disease Other    Colon polyps Neg Hx    Esophageal cancer Neg Hx    Liver cancer Neg Hx    Pancreatic cancer Neg Hx    Stomach cancer Neg Hx    Crohn's disease Neg Hx    Rectal cancer Neg Hx    Colon cancer Neg Hx     Social History Social History   Tobacco Use   Smoking status: Former    Current packs/day: 0.00    Average packs/day: 0.1 packs/day for 10.0 years (1.2 ttl pk-yrs)    Types: Cigarettes     Start date: 12/10/1980    Quit date: 12/10/1990    Years since quitting: 33.1   Smokeless tobacco: Never  Vaping Use   Vaping status: Never Used  Substance Use Topics   Alcohol use: Never    Alcohol/week: 0.0 standard drinks of alcohol   Drug use: Not Currently    Types: Marijuana    Comment: last use 2 days ago   Allergies Codeine  Review of Systems Review of Systems  Physical Exam Vital Signs  I have reviewed the triage  vital signs BP (!) 201/111   Pulse (!) 105   Temp (!) 97.5 F (36.4 C) (Oral)   Resp 17   LMP 08/11/2012   SpO2 100%   Physical Exam  ED Results and Treatments Labs (all labs ordered are listed, but only abnormal results are displayed) Labs Reviewed  LIPASE, BLOOD - Abnormal; Notable for the following components:      Result Value   Lipase 125 (*)    All other components within normal limits  COMPREHENSIVE METABOLIC PANEL - Abnormal; Notable for the following components:   Chloride 97 (*)    Glucose, Bld 286 (*)    All other components within normal limits  URINALYSIS, ROUTINE W REFLEX MICROSCOPIC - Abnormal; Notable for the following components:   Color, Urine STRAW (*)    Glucose, UA >=500 (*)    Ketones, ur 5 (*)    All other components within normal limits  CBC                                                                                                                          Radiology CT ABDOMEN PELVIS W CONTRAST Result Date: 01/21/2024 CLINICAL DATA:  Abdomen pain nausea EXAM: CT ABDOMEN AND PELVIS WITH CONTRAST TECHNIQUE: Multidetector CT imaging of the abdomen and pelvis was performed using the standard protocol following bolus administration of intravenous contrast. RADIATION DOSE REDUCTION: This exam was performed according to the departmental dose-optimization program which includes automated exposure control, adjustment of the mA and/or kV according to patient size and/or use of iterative reconstruction technique. CONTRAST:   OMNIPAQUE IOHEXOL 300 MG/ML  SOLN COMPARISON:  CT 01/11/2024, 01/10/2024 FINDINGS: Lower chest: Lung bases are clear Hepatobiliary: No focal liver abnormality is seen. No gallstones, gallbladder wall thickening, or biliary dilatation. Pancreas: Unremarkable. No pancreatic ductal dilatation or surrounding inflammatory changes. Spleen: Normal in size without focal abnormality. Adrenals/Urinary Tract: Adrenal glands are normal. Kidneys show no hydronephrosis. Distended urinary bladder Stomach/Bowel: Stomach is within normal limits. Scattered diverticular disease of the colon. No evidence of bowel wall thickening, distention, or inflammatory changes. Vascular/Lymphatic: Aortic atherosclerosis. No enlarged abdominal or pelvic lymph nodes. Reproductive: Status post hysterectomy. No adnexal masses. Other: Negative for pelvic effusion or free air Musculoskeletal: No acute or suspicious osseous abnormality IMPRESSION: 1. No CT evidence for acute intra-abdominal or pelvic abnormality. 2. Scattered diverticular disease of the colon without acute inflammatory process. 3. Distended urinary bladder. 4. Aortic atherosclerosis. Aortic Atherosclerosis (ICD10-I70.0). Electronically Signed   By: Jasmine Pang M.D.   On: 01/21/2024 19:54    Pertinent labs & imaging results that were available during my care of the patient were reviewed by me and considered in my medical decision making (see MDM for details).  Medications Ordered in ED Medications  HYDROmorphone (DILAUDID) injection 1 mg (1 mg Intravenous Not Given 01/21/24 1938)  ondansetron (ZOFRAN-ODT) disintegrating tablet 4 mg (4 mg Oral Given 01/21/24 1403)  oxyCODONE-acetaminophen (PERCOCET/ROXICET) 5-325 MG per tablet 1 tablet (1  tablet Oral Given 01/21/24 1403)  iohexol (OMNIPAQUE) 300 MG/ML solution 100 mL (100 mLs Intravenous Contrast Given 01/21/24 1857)  ondansetron (ZOFRAN) injection 4 mg (4 mg Intravenous Given 01/21/24 1941)  HYDROmorphone (DILAUDID) injection 1  mg (1 mg Intravenous Given 01/21/24 1944)                                                                                                                                     Procedures Procedures  (including critical care time)  Medical Decision Making / ED Course   This patient presents to the ED for concern of abdominal pain, this involves an extensive number of treatment options, and is a complaint that carries with it a high risk of complications and morbidity.  The differential diagnosis includes gastritis, pancreatitis, cholecystitis, urinary retention.  MDM: On exam, patient with epigastric tenderness.  Patient does have distended bladder, had nursing staff done in and out cath.  CT imaging without any acute process, however patient with elevated lipase, epigastric tenderness, vomiting.  Previous likely has pancreatitis.  Patient unable to tolerate p.o., will admit.   Additional history obtained: -Additional history obtained from  -External records from outside source obtained and reviewed including: Chart review including previous notes, labs, imaging, consultation notes   Lab Tests: -I ordered, reviewed, and interpreted labs.   The pertinent results include:   Labs Reviewed  LIPASE, BLOOD - Abnormal; Notable for the following components:      Result Value   Lipase 125 (*)    All other components within normal limits  COMPREHENSIVE METABOLIC PANEL - Abnormal; Notable for the following components:   Chloride 97 (*)    Glucose, Bld 286 (*)    All other components within normal limits  URINALYSIS, ROUTINE W REFLEX MICROSCOPIC - Abnormal; Notable for the following components:   Color, Urine STRAW (*)    Glucose, UA >=500 (*)    Ketones, ur 5 (*)    All other components within normal limits  CBC      EKG   EKG Interpretation Date/Time:    Ventricular Rate:    PR Interval:    QRS Duration:    QT Interval:    QTC Calculation:   R Axis:      Text Interpretation:            Imaging Studies ordered: I ordered imaging studies including CT imaging abdomen pelvis I independently visualized and interpreted imaging. I agree with the radiologist interpretation   Medicines ordered and prescription drug management: Meds ordered this encounter  Medications   ondansetron (ZOFRAN-ODT) disintegrating tablet 4 mg   oxyCODONE-acetaminophen (PERCOCET/ROXICET) 5-325 MG per tablet 1 tablet    Refill:  0   iohexol (OMNIPAQUE) 300 MG/ML solution 100 mL   HYDROmorphone (DILAUDID) injection 1 mg   ondansetron (ZOFRAN) injection 4 mg   HYDROmorphone (DILAUDID) injection 1 mg    -I have reviewed the patients  home medicines and have made adjustments as needed  Cardiac Monitoring: The patient was maintained on a cardiac monitor.  I personally viewed and interpreted the cardiac monitored which showed an underlying rhythm of: Sinus rhythm  Social Determinants of Health:  Factors impacting patients care include: Poor medication compliance, diabetes   Reevaluation: After the interventions noted above, I reevaluated the patient and found that they have :improved  Co morbidities that complicate the patient evaluation  Past Medical History:  Diagnosis Date   Allergy    seasonal   Anxiety    B12 deficiency    Chest pain 04/05/2017   CHF (congestive heart failure) ([name])    Diabetes mellitus type 1, uncontrolled    "dr. 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

## 2024-01-21 NOTE — ED Triage Notes (Signed)
Pt presents via EMS from home with c/o abdominal pain and nausea. Pt given 50 mcg of IV and 4 mg zofran via EMS.

## 2024-01-21 NOTE — ED Provider Triage Note (Signed)
Emergency Medicine Provider Triage Evaluation Note  [name] [name] , a 63 y.o. female  was evaluated in triage.  Pt complains of lower abdominal pain and nausea. Had a UTI and was given antibiotics but pain is worsening and now radiating to the back.  Review of Systems  Positive: Abdominal pain, nausea Negative: fevers  Physical Exam  BP 118/84 (BP Location: Left Arm)   Pulse 89   Temp 98 F (36.7 C) (Oral)   Resp 16   LMP 08/11/2012   SpO2 100%  Gen:   Awake, no distress   Resp:  Normal effort  MSK:   Moves extremities without difficulty  Other:    Medical Decision Making  Medically screening exam initiated at 1:58 PM.  Appropriate orders placed.  [name] [name] was informed that the remainder of the evaluation will be completed by another provider, this initial triage assessment does not replace that evaluation, and the importance of remaining in the ED until their evaluation is complete.    Maxwell Marion, PA-C 01/21/24 1359

## 2024-01-21 NOTE — H&P (Signed)
History and Physical    Patient: [name] [name] ZOX:[id number] DOB: 07/22/1961 DOA: 01/21/2024 DOS: the patient was seen and examined on 01/21/2024 PCP: Everardo All, NP  Patient coming from: Home  Chief Complaint:  Chief Complaint  Patient presents with   Abdominal Pain   HPI: [name] [name] is a 63 y.o. female with medical history significant of seasonal allergies, type 1 diabetes which is uncontrolled, CHF, GERD, essential hypertension, hyperlipidemia, hypothyroidism, chronic kidney disease stage II, peripheral neuropathy who presents to the ER with abdominal pain, nausea vomiting.  Pain is rated as 9 out of 10 in the whole abdomen.  Mainly in the epigastric region.  No diarrhea, no melena no bright red blood per rectum.  Patient was seen and evaluated.  Workup so far shows lipase of 125 and hyperglycemia.  Patient denied alcohol intake.  Denied history of gallstones.  Review of Systems: As mentioned in the history of present illness. All other systems reviewed and are negative. Past Medical History:  Diagnosis Date   Allergy    seasonal   Anxiety    B12 deficiency    Chest pain 04/05/2017   CHF (congestive heart failure) ([name])    Diabetes mellitus type 1, uncontrolled    "dr. 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) 10/29/2013   Leg cramping    "@ night" (09/18/2013)   Migraine    "once q couple months" (11/26/2018)   Osteoporosis    Peripheral neuropathy    Umbilical hernia    unrepaired (09/18/2013)   Past Surgical History:  Procedure Laterality Date   BIOPSY  06/03/2019   Procedure: BIOPSY;  Surgeon: Lynann Bologna, MD;  Location: [facility];  Service: Endoscopy;;   BIOPSY  08/17/2020   Procedure: BIOPSY;   Surgeon: Tressia Danas, MD;  Location: [facility];  Service: Gastroenterology;;   BIOPSY  02/05/2023   Procedure: BIOPSY;  Surgeon: Meryl Dare, MD;  Location: [facility];  Service: Gastroenterology;;   BIOPSY  09/24/2023   Procedure: BIOPSY;  Surgeon: Lemar Lofty., MD;  Location: [facility];  Service: Gastroenterology;;   CESAREAN SECTION  1989   ENTEROSCOPY N/A 06/03/2019   Procedure: ENTEROSCOPY;  Surgeon: Lynann Bologna, MD;  Location: [facility];  Service: Endoscopy;  Laterality: N/A;   ESOPHAGOGASTRODUODENOSCOPY (EGD) WITH PROPOFOL N/A 08/17/2020   Procedure: ESOPHAGOGASTRODUODENOSCOPY (EGD) WITH PROPOFOL;  Surgeon: Tressia Danas, MD;  Location: [facility];  Service: Gastroenterology;  Laterality: N/A;   ESOPHAGOGASTRODUODENOSCOPY (EGD) WITH PROPOFOL N/A 02/05/2023   Procedure: ESOPHAGOGASTRODUODENOSCOPY (EGD) WITH PROPOFOL;  Surgeon: Meryl Dare, MD;  Location: [facility];  Service: Gastroenterology;  Laterality: N/A;   ESOPHAGOGASTRODUODENOSCOPY (EGD) WITH PROPOFOL N/A 09/24/2023   Procedure: ESOPHAGOGASTRODUODENOSCOPY (EGD) WITH PROPOFOL;  Surgeon: Meridee Score Netty Starring., MD;  Location: [facility];  Service: Gastroenterology;  Laterality: N/A;   EUS N/A 09/24/2023   Procedure: UPPER ENDOSCOPIC ULTRASOUND (EUS) LINEAR;  Surgeon: Lemar Lofty., MD;  Location: [facility];  Service: Gastroenterology;  Laterality: N/A;   LAPAROSCOPIC ASSISTED VAGINAL HYSTERECTOMY  09/23/2012   Procedure: LAPAROSCOPIC ASSISTED VAGINAL HYSTERECTOMY;  Surgeon: Mitchel Honour, DO;  Location: [facility];  Service: Gynecology;  Laterality: N/A;  pull Dr Vance Gather instrument   LEFT HEART CATH AND CORONARY  ANGIOGRAPHY N/A 02/11/2017   Procedure: Left Heart Cath and Coronary Angiography;  Surgeon: Runell Gess, MD;  Location: [facility];  Service: Cardiovascular;  Laterality: N/A;   TUBAL LIGATION Bilateral 1992   Social History:  reports that she quit smoking about  33 years ago. Her smoking use included cigarettes. She started smoking about 43 years ago. She has a 1.2 pack-year smoking history. She has never used smokeless tobacco. She reports that she does not currently use drugs after having used the following drugs: Marijuana. She reports that she does not drink alcohol.  Allergies  Allergen Reactions   Codeine Hives, Anxiety and Other (See Comments)    Family History  Problem Relation Age of Onset   Heart failure Mother 35       Her heart skipped a beat and stopped - per pt   Heart failure Brother    Diabetes Brother    Diabetes Maternal Grandmother    Alzheimer's disease Maternal Grandmother    Coronary artery disease Maternal Grandmother    Heart attack Maternal Grandfather    Diabetes Other    Heart disease Other    Colon polyps Neg Hx    Esophageal cancer Neg Hx    Liver cancer Neg Hx    Pancreatic cancer Neg Hx    Stomach cancer Neg Hx    Crohn's disease Neg Hx    Rectal cancer Neg Hx    Colon cancer Neg Hx     Prior to Admission medications   Medication Sig Start Date End Date Taking? Authorizing Provider  acetaminophen (TYLENOL) 500 MG tablet Take 1,000 mg by mouth every 6 (six) hours as needed for moderate pain (pain score 4-6).    [provider]  aspirin EC 81 MG tablet Take 81 mg by mouth daily. Swallow whole.    [provider]  azelastine (ASTELIN) 0.1 % nasal spray Place 1 spray into both nostrils as needed for rhinitis or allergies. 11/04/23   [provider]  Continuous Blood Gluc Transmit (DEXCOM G6 TRANSMITTER) MISC USE AS DIRECTED FOR CONTINUOUS GLUCOSE MONITORING. REUSE TRANSMITTER X 90DAYS THEN DISCARD & REPLACE 07/05/21   Bedsole, Amy E, MD  escitalopram (LEXAPRO) 10 MG tablet Take 1 tablet (10 mg total) by mouth daily. 11/18/23   Rodolph Bong, MD  Glucagon, rDNA, (GLUCAGON EMERGENCY) 1 MG KIT Inject 1 mg into the skin See admin instructions. Inject one mg into the skin see  administration instructions. Follow package directions for low blood sugar. 06/18/23   [provider]  levothyroxine (SYNTHROID) 137 MCG tablet Take 137 mcg by mouth daily before breakfast. 03/19/22   [provider]  metoCLOPramide (REGLAN) 5 MG tablet Take 1 tablet (5 mg total) by mouth 3 (three) times daily before meals. 11/12/23   Danford, Earl Lites, MD  NOVOLOG FLEXPEN 100 UNIT/ML FlexPen Inject 10 Units into the skin 3 (three) times daily with meals. Plus sliding scale TID 12/08/23   Azucena Fallen, MD  omega-3 acid ethyl esters (LOVAZA) 1 g capsule Take 2 capsules (2 g total) by mouth 2 (two) times daily. 09/13/23   Alwyn Ren, MD  ondansetron (ZOFRAN) 4 MG tablet Take 2 tablets (8 mg total) by mouth every 8 (eight) hours as needed for nausea or vomiting. 12/25/23   Mansouraty, Netty Starring., MD  ondansetron (ZOFRAN-ODT) 8 MG disintegrating tablet Take 1 tablet (8 mg total) by mouth every 8 (eight) hours as needed for nausea or vomiting. 01/10/24  Lorre Nick, MD  pantoprazole (PROTONIX) 40 MG tablet Take 1 tablet (40 mg total) by mouth 2 (two) times daily. Take 30 minutes before breakfast and dinner 01/16/24   Arnaldo Natal, NP  QUEtiapine (SEROQUEL) 50 MG tablet Take 50 mg by mouth at bedtime. 09/09/23   [provider]  rosuvastatin (CRESTOR) 10 MG tablet Take 1 tablet (10 mg total) by mouth daily. 11/18/23   Rodolph Bong, MD  sucralfate (CARAFATE) 1 g tablet Take 1 tablet (1 g total) by mouth 2 (two) times daily. Do not take any other medication within 2-4 hours. 12/25/23   Mansouraty, Netty Starring., MD  sulfamethoxazole-trimethoprim (BACTRIM DS) 800-160 MG tablet Take 1 tablet by mouth every 12 (twelve) hours for 10 days. 01/14/24 01/24/24  Marinda Elk, MD  tamsulosin (FLOMAX) 0.4 MG CAPS capsule Take 1 capsule (0.4 mg total) by mouth daily after supper. 01/06/24   Danford, Earl Lites, MD  TOUJEO SOLOSTAR 300 UNIT/ML Solostar Pen  Inject 25 Units into the skin at bedtime. 12/08/23   Azucena Fallen, MD  traMADol Janean Sark) 50 MG tablet 1 to 2 tablets every 6 hours with extra strength Tylenol for pain as needed 12/12/23   Arby Barrette, MD  traZODone (DESYREL) 50 MG tablet Take 50 mg by mouth at bedtime as needed for sleep.    [provider]    Physical Exam: Vitals:   01/21/24 1620 01/21/24 1945 01/21/24 2015 01/21/24 2200  BP: (!) 123/91 (!) 201/111 131/84   Pulse: 87 (!) 105 82   Resp: 16 17 (!) 21   Temp: (!) 97.5 F (36.4 C)   98.8 F (37.1 C)  TempSrc: Oral     SpO2: 100% 100% 98%    Constitutional: NAD, calm, comfortable Eyes: PERRL, lids and conjunctivae normal ENMT: Mucous membranes are moist. Posterior pharynx clear of any exudate or lesions.Normal dentition.  Neck: normal, supple, no masses, no thyromegaly Respiratory: clear to auscultation bilaterally, no wheezing, no crackles. Normal respiratory effort. No accessory muscle use.  Cardiovascular: Regular rate and rhythm, no murmurs / rubs / gallops. No extremity edema. 2+ pedal pulses. No carotid bruits.  Abdomen: Diffuse epigastric tenderness, no masses palpated. No hepatosplenomegaly. Bowel sounds positive.  Musculoskeletal: Good range of motion, no joint swelling or tenderness, Skin: no rashes, lesions, ulcers. No induration Neurologic: CN 2-12 grossly intact. Sensation intact, DTR normal. Strength 5/5 in all 4.  Psychiatric: Normal judgment and insight. Alert and oriented x 3. Normal mood  Data Reviewed:  Temperature 97.5, blood pressure 201/111, pulse 105 respiratory on oxygen sat 98% room air.  Glucose 286, chloride 97, CT abdomen pelvis showed scattered diverticular disease of the colon without inflammatory process distended urinary bladder and aortic sclerosis otherwise no significant findings  Assessment and Plan:  #1 acute pancreatitis: No CT evidence of pancreatitis but symptomatic.  Lipase is also mildly elevated.  Cause is  unclear.  Patient denied ever drinking alcohol.  CT did not show any gallbladder disease.  Will get right upper quadrant abdominal ultrasound.  Also check lipid panel to see if with the triglycerides will look like.  Patient will be n.p.o., IV fluids, nausea and vomiting control.  #2 type 2 diabetes: Sliding scale insulin.  It appears uncontrolled.  Hemoglobin A1c almost 15  #3 nonobstructive coronary artery disease: Stable.  #4 hypothyroidism: Continue with levothyroxine.  #5 chronic diastolic CHF: Compensated.  Continue to monitor.  #6 GERD: Continue with PPI.  #7 hyperlipidemia: Will resume statin when oral intake  starts.  #8 chronic kidney disease stage II: Stable.  #9 paroxysmal atrial fibrillation: Rate is controlled no anticoagulation    Advance Care Planning:   Code Status: Full Code   Consults: None  Family Communication: No family at bedside  Severity of Illness: The appropriate patient status for this patient is INPATIENT. Inpatient status is judged to be reasonable and necessary in order to provide the required intensity of service to ensure the patient's safety. The patient's presenting symptoms, physical exam findings, and initial radiographic and laboratory data in the context of their chronic comorbidities is felt to place them at high risk for further clinical deterioration. Furthermore, it is not anticipated that the patient will be medically stable for discharge from the hospital within 2 midnights of admission.   * I certify that at the point of admission it is my clinical judgment that the patient will require inpatient hospital care spanning beyond 2 midnights from the point of admission due to high intensity of service, high risk for further deterioration and high frequency of surveillance required.*  AuthorLonia Blood, MD 01/21/2024 10:43 PM  For on call review [email or web address].

## 2024-01-22 DIAGNOSIS — R112 Nausea with vomiting, unspecified: Secondary | ICD-10-CM

## 2024-01-22 LAB — COMPREHENSIVE METABOLIC PANEL
ALT: 15 U/L (ref 0–44)
AST: 19 U/L (ref 15–41)
Albumin: 3.9 g/dL (ref 3.5–5.0)
Alkaline Phosphatase: 60 U/L (ref 38–126)
Anion gap: 12 (ref 5–15)
BUN: 16 mg/dL (ref 6–20)
CO2: 26 mmol/L (ref 22–32)
Calcium: 8.9 mg/dL (ref 8.9–10.3)
Chloride: 93 mmol/L — ABNORMAL LOW (ref 98–111)
Creatinine, Ser: 0.71 mg/dL (ref 0.44–1.00)
GFR, Estimated: >60 mL/min (ref >60)
Glucose, Bld: 284 mg/dL — ABNORMAL HIGH (ref 70–99)
Potassium: 3.6 mmol/L (ref 3.5–5.1)
Sodium: 131 mmol/L — ABNORMAL LOW (ref 135–145)
Total Bilirubin: 0.7 mg/dL (ref 0.0–1.2)
Total Protein: 7.4 g/dL (ref 6.5–8.1)

## 2024-01-22 LAB — CBG MONITORING, ED
Glucose-Capillary: 157 mg/dL — ABNORMAL HIGH (ref 70–99)
Glucose-Capillary: 313 mg/dL — ABNORMAL HIGH (ref 70–99)
Glucose-Capillary: 336 mg/dL — ABNORMAL HIGH (ref 70–99)

## 2024-01-22 LAB — CBC
HCT: 37.6 % (ref 36.0–46.0)
HCT: 42.8 % (ref 36.0–46.0)
Hemoglobin: 12.2 g/dL (ref 12.0–15.0)
Hemoglobin: 13.5 g/dL (ref 12.0–15.0)
MCH: 27.4 pg (ref 26.0–34.0)
MCH: 28 pg (ref 26.0–34.0)
MCHC: 31.5 g/dL (ref 30.0–36.0)
MCHC: 32.4 g/dL (ref 30.0–36.0)
MCV: 86.2 fL (ref 80.0–100.0)
MCV: 86.8 fL (ref 80.0–100.0)
Platelets: 371 10*3/uL (ref 150–400)
Platelets: 434 10*3/uL — ABNORMAL HIGH (ref 150–400)
RBC: 4.36 MIL/uL (ref 3.87–5.11)
RBC: 4.93 MIL/uL (ref 3.87–5.11)
RDW: 15.4 % (ref 11.5–15.5)
RDW: 15.5 % (ref 11.5–15.5)
WBC: 12.1 10*3/uL — ABNORMAL HIGH (ref 4.0–10.5)
WBC: 8.8 10*3/uL (ref 4.0–10.5)
nRBC: 0 % (ref 0.0–0.2)
nRBC: 0 % (ref 0.0–0.2)

## 2024-01-22 LAB — LIPASE, BLOOD: Lipase: 41 U/L (ref 11–51)

## 2024-01-22 LAB — CREATININE, SERUM
Creatinine, Ser: 0.75 mg/dL (ref 0.44–1.00)
GFR, Estimated: >60 mL/min (ref >60)

## 2024-01-22 LAB — GLUCOSE, CAPILLARY
Glucose-Capillary: 161 mg/dL — ABNORMAL HIGH (ref 70–99)
Glucose-Capillary: 118 mg/dL — ABNORMAL HIGH (ref 70–99)

## 2024-01-22 MED ORDER — ESCITALOPRAM OXALATE 10 MG PO TABS
10.0000 mg | ORAL_TABLET | Freq: Every day | ORAL | Status: DC
  Administered 2024-01-22 – 2024-01-26 (×5): 10 mg via ORAL
  Filled 2024-01-22 (×5): qty 1

## 2024-01-22 MED ORDER — AZELASTINE HCL 0.1 % NA SOLN: 1.0000 | Freq: Two times a day (BID) | NASAL | Status: DC | PRN

## 2024-01-22 MED ORDER — PROMETHAZINE (PHENERGAN) 6.25MG IN NS 50ML IVPB
6.2500 mg | Freq: Four times a day (QID) | INTRAVENOUS | Status: DC | PRN
  Administered 2024-01-22 – 2024-01-25 (×3): 6.25 mg via INTRAVENOUS
  Filled 2024-01-22 (×3): qty 6.25

## 2024-01-22 MED ORDER — TAMSULOSIN HCL 0.4 MG PO CAPS
0.4000 mg | ORAL_CAPSULE | Freq: Every day | ORAL | Status: DC
  Administered 2024-01-22 – 2024-01-25 (×4): 0.4 mg via ORAL
  Filled 2024-01-22 (×4): qty 1

## 2024-01-22 MED ORDER — LEVOTHYROXINE SODIUM 25 MCG PO TABS
137.0000 ug | ORAL_TABLET | Freq: Every day | ORAL | Status: DC
  Administered 2024-01-22 – 2024-01-26 (×5): 137 ug via ORAL
  Filled 2024-01-22 (×6): qty 1

## 2024-01-22 MED ORDER — SODIUM CHLORIDE 0.9 % IV SOLN: INTRAVENOUS | Status: AC

## 2024-01-22 MED ORDER — INSULIN GLARGINE-YFGN 100 UNIT/ML ~~LOC~~ SOLN
5.0000 [IU] | Freq: Every day | SUBCUTANEOUS | Status: DC
  Administered 2024-01-23 – 2024-01-26 (×3): 5 [IU] via SUBCUTANEOUS
  Filled 2024-01-22 (×7): qty 0.05

## 2024-01-22 MED ORDER — PANTOPRAZOLE SODIUM 40 MG IV SOLR
40.0000 mg | Freq: Every day | INTRAVENOUS | Status: DC
  Administered 2024-01-22 – 2024-01-25 (×4): 40 mg via INTRAVENOUS
  Filled 2024-01-22 (×4): qty 10

## 2024-01-22 MED ORDER — ASPIRIN 81 MG PO TBEC
81.0000 mg | DELAYED_RELEASE_TABLET | Freq: Every day | ORAL | Status: DC
  Administered 2024-01-22 – 2024-01-26 (×5): 81 mg via ORAL
  Filled 2024-01-22 (×3): qty 1

## 2024-01-22 MED ORDER — SULFAMETHOXAZOLE-TRIMETHOPRIM 800-160 MG PO TABS
1.0000 | ORAL_TABLET | Freq: Two times a day (BID) | ORAL | Status: DC
  Administered 2024-01-22 – 2024-01-26 (×8): 1 via ORAL
  Filled 2024-01-22 (×9): qty 1

## 2024-01-22 MED ORDER — TRAMADOL HCL 50 MG PO TABS
50.0000 mg | ORAL_TABLET | Freq: Four times a day (QID) | ORAL | Status: DC | PRN
  Administered 2024-01-23 – 2024-01-26 (×4): 50 mg via ORAL
  Filled 2024-01-22 (×4): qty 1

## 2024-01-22 NOTE — Hospital Course (Addendum)
63 y.o. female with past medical history HTN, HLD, hypothyroidism, IBS, chronic diastolic CHF CKD II, migraine, diabetes mellitus with recent admission 2/1 to 2/4 for emphysematous cystitis and E. coli UTI, discharged on Bactrim x 10 days with plan for urology follow-up for cystoscopy and uncontrolled diabetes presented to the ED with lower abdominal pain, nausea vomiting difficulty urinating with increased pain in the epigastric region. In the ED vitals: Stable afebrile Labs: CMP stable with hyperglycemia 286, normal CBC UA with WBC 0-5 nitrite LE negative.  Normal LFTs lipase 125. CT abdomen pelvis with contrast>> no acute intra-abdominal or pelvic abnormality, scattered diverticular disease of the colon, distended urinary bladder, aortic atherosclerosis were noticed. Patient is admitted for further management

## 2024-01-22 NOTE — Inpatient Diabetes Management (Signed)
Inpatient Diabetes Program Recommendations  AACE/ADA: New Consensus Statement on Inpatient Glycemic Control   Target Ranges:  Prepandial:   less than 140 mg/dL      Peak postprandial:   less than 180 mg/dL (1-2 hours)      Critically ill patients:  140 - 180 mg/dL    Latest Reference Range & Units 01/22/24 00:29 01/22/24 08:15  Glucose-Capillary 70 - 99 mg/dL 962 (H) 952 (H)    Latest Reference Range & Units 01/21/24 13:57 01/22/24 05:06  Glucose 70 - 99 mg/dL 841 (H) 324 (H)   Review of Glycemic Control  Diabetes history: DM2 Outpatient Diabetes medications: Toujeo 25 units at bedtime, Novolog 10 units TID with meals, Dexcom G6 CGM Current orders for Inpatient glycemic control: Semglee 5 units daily, Novolog 0-6 units TID with meals, Novolog 0-5 units QHS  Inpatient Diabetes Program Recommendations:    Insulin: Please consider increasing Semglee to 10 units daily and ordering Novolog 3 units TID with meals for meal coverage if patient eats at least 50% of meals.  Thanks, Orlando Penner, RN, MSN, CDCES Diabetes Coordinator Inpatient Diabetes Program [phone] (Team Pager from 8am to 5pm)

## 2024-01-22 NOTE — Progress Notes (Signed)
PROGRESS NOTE [name] [name]  ZOX:[id number] DOB: 1961-07-27 DOA: 01/21/2024 PCP: Everardo All, NP  Brief Narrative/Hospital Course: 63 y.o. female with past medical history HTN, HLD, hypothyroidism, IBS, chronic diastolic CHF CKD II, migraine, diabetes mellitus with recent admission 2/1 to 2/4 for emphysematous cystitis and E. coli UTI, discharged on Bactrim x 10 days with plan for urology follow-up for cystoscopy and uncontrolled diabetes presented to the ED with lower abdominal pain, nausea vomiting difficulty urinating with increased pain in the epigastric region.  In the ED vitals: Stable afebrile Labs: CMP stable with hyperglycemia 286, normal CBC UA with WBC 0-5 nitrite LE negative.  Normal LFTs lipase 125. CT abdomen pelvis with contrast>> no acute intra-abdominal or pelvic abnormality, scattered diverticular disease of the colon, distended urinary bladder, aortic atherosclerosis were noticed. Patient is admitted for further management    Subjective: Seen and examined C/o nausea abd distension and pain around umbilical area Had BM yesterday and no vomiting since yeaterday   Assessment and Plan: Principal Problem:   Acute pancreatitis Active Problems:   Uncontrolled type 1 diabetes mellitus with hyperglycemia, with long-term current use of insulin ([name])   non obstructive CAD (coronary artery disease)   Hypothyroidism   Chronic diastolic HF (heart failure) ([name])   Hyperlipidemia   GERD   Kidney disease, chronic, stage II (GFR 60-89 ml/min)   Paroxysmal A-fib ([name])   Nausea vomiting abdominal pain Recent admission for emphysematous cystitis and E. coli UTI on Bactrim: Unclear etiology, ?  Due to Bactrim versus recent cystitis/distended Bladder. CT abdomen pelvis LFTs labs unremarkable other than elevated lipase at 125-unclear significance given normal pancreas on the imaging, it could represent dehydrational state. Denies any etoh use. Continue with symptomatic management,  ensure voiding well.  She is to continue on Bactrim x 10 days from last discharge on 2/4.  Continue antibiotics, PPI antiemetics IV fluid hydration ADAT, repeat lipase is normal. Regume reglan add prn phenergan.  Diabetes mellitus with uncontrolled hyperglycemia: Changed IVF from dextrose to NS. MONITOR Recent Labs  Lab 01/22/24 0029 01/22/24 0815  GLUCAP 336* 313*     CKD Stage II: Renal function is stable at baseline  Chronic diastolic CHF Hypertension: BP controlled volume status stable  Hyperlipidemia: Hold meds resume upon discharge  Hypothyroidism: Continue home Synthroid   DVT prophylaxis: enoxaparin (LOVENOX) injection 40 mg Start: 01/22/24 1000 Code Status:   Code Status: Full Code Family Communication: plan of care discussed with patient at bedside. Patient status is: Remains hospitalized because of severity of illness Level of care: Med-Surg   Dispo: The patient is from: home            Anticipated disposition: TBD Objective: Vitals last 24 hrs: Vitals:   01/22/24 0547 01/22/24 0548 01/22/24 0559 01/22/24 0800  BP:    (!) 158/107  Pulse: 84 83  92  Resp: 11 11  19   Temp:   98.1 F (36.7 C)   TempSrc:   Oral   SpO2: 93% 94%  98%   Weight change:   Physical Examination: General exam: alert awake,at baseline, older than stated age HEENT:Oral mucosa moist, Ear/Nose WNL grossly Respiratory system: Bilaterally clear BS,no use of accessory muscle Cardiovascular system: S1 & S2 +, No JVD. Gastrointestinal system: Abdomen soft, distention present with voluntary guarding,tender centrally, bowel sound present  Nervous System: Alert, awake, moving all extremities,and following commands. Extremities: LE edema neg,distal peripheral pulses palpable and warm.  Skin: No rashes,no icterus. MSK: Normal muscle bulk,tone, power  Medications reviewed:  Scheduled Meds:  aspirin EC  81 mg Oral Daily   enoxaparin (LOVENOX) injection  40 mg Subcutaneous Q24H    escitalopram  10 mg Oral Daily    HYDROmorphone (DILAUDID) injection  1 mg Intravenous Once   insulin aspart  0-5 Units Subcutaneous QHS   insulin aspart  0-6 Units Subcutaneous TID WC   [START ON 01/23/2024] levothyroxine  137 mcg Oral QAC breakfast   pantoprazole (PROTONIX) IV  40 mg Intravenous Q24H   sulfamethoxazole-trimethoprim  1 tablet Oral Q12H   tamsulosin  0.4 mg Oral QPC supper   Continuous Infusions:  sodium chloride 40 mL/hr at 01/22/24 0828   promethazine (PHENERGAN) injection (IM or IVPB)      Diet Order             Diet clear liquid Room service appropriate? Yes; Fluid consistency: Thin  Diet effective now                   Intake/Output Summary (Last 24 hours) at 01/22/2024 1028 Last data filed at 01/22/2024 0828 Gross per 24 hour  Intake 948.36 ml  Output --  Net 948.36 ml   Net IO Since Admission: 948.36 mL [01/22/24 1028]  Wt Readings from Last 3 Encounters:  01/16/24 56.4 kg  01/14/24 53.7 kg  01/05/24 53.6 kg     Unresulted Labs (From admission, onward)     Start     Ordered   01/28/24 0500  Creatinine, serum  (enoxaparin (LOVENOX)    CrCl >/= 30 ml/min)  Weekly,   R     Comments: while on enoxaparin therapy    01/21/24 2342           Data Reviewed: I have personally reviewed following labs and imaging studies CBC: Recent Labs  Lab 01/21/24 1357 01/22/24 0026 01/22/24 0506  WBC 9.4 8.8 12.1*  HGB 13.8 12.2 13.5  HCT 42.7 37.6 42.8  MCV 85.2 86.2 86.8  PLT 384 371 434*   Basic Metabolic Panel:  Recent Labs  Lab 01/21/24 1357 01/22/24 0026 01/22/24 0506  NA 136  --  131*  K 3.7  --  3.6  CL 97*  --  93*  CO2 27  --  26  GLUCOSE 286*  --  284*  BUN 14  --  16  CREATININE 0.76 0.75 0.71  CALCIUM 9.2  --  8.9   GFR: Estimated Creatinine Clearance: 57.3 mL/min (by C-G formula based on SCr of 0.71 mg/dL). Liver Function Tests:  Recent Labs  Lab 01/21/24 1357 01/22/24 0506  AST 16 19  ALT 15 15  ALKPHOS 60 60  BILITOT  0.6 0.7  PROT 7.2 7.4  ALBUMIN 4.0 3.9   Recent Labs  Lab 01/21/24 1357 01/22/24 0506  LIPASE 125* 41   No results for input(s): "AMMONIA" in the last 168 hours. No results found for this or any previous visit (from the past 240 hours).  Antimicrobials/Microbiology: Anti-infectives (From admission, onward)    Start     Dose/Rate Route Frequency Ordered Stop   01/22/24 1030  sulfamethoxazole-trimethoprim (BACTRIM DS) 800-160 MG per tablet 1 tablet        1 tablet Oral Every 12 hours 01/22/24 1028           Component Value Date/Time   SDES  01/11/2024 2031    BLOOD RIGHT HAND Performed at [facility], [address] [place], [place] [address]    [facility]  01/11/2024 2031  BOTTLES DRAWN AEROBIC AND ANAEROBIC Blood Culture results may not be optimal due to an inadequate volume of blood received in culture bottles Performed at [facility], [address], [place], [place] [address]    CULT  01/11/2024 2031    NO GROWTH 5 DAYS Performed at [facility], [address], [place], [place] [address]    REPTSTATUS 01/17/2024 FINAL 01/11/2024 2031     Radiology Studies: CT ABDOMEN PELVIS W CONTRAST Result Date: 01/21/2024 CLINICAL DATA:  Abdomen pain nausea EXAM: CT ABDOMEN AND PELVIS WITH CONTRAST TECHNIQUE: Multidetector CT imaging of the abdomen and pelvis was performed using the standard protocol following bolus administration of intravenous contrast. RADIATION DOSE REDUCTION: This exam was performed according to the departmental dose-optimization program which includes automated exposure control, adjustment of the mA and/or kV according to patient size and/or use of iterative reconstruction technique. CONTRAST:  OMNIPAQUE IOHEXOL 300 MG/ML  SOLN COMPARISON:  CT 01/11/2024, 01/10/2024 FINDINGS: Lower chest: Lung bases are clear Hepatobiliary: No focal liver abnormality is seen. No gallstones, gallbladder wall thickening, or biliary  dilatation. Pancreas: Unremarkable. No pancreatic ductal dilatation or surrounding inflammatory changes. Spleen: Normal in size without focal abnormality. Adrenals/Urinary Tract: Adrenal glands are normal. Kidneys show no hydronephrosis. Distended urinary bladder Stomach/Bowel: Stomach is within normal limits. Scattered diverticular disease of the colon. No evidence of bowel wall thickening, distention, or inflammatory changes. Vascular/Lymphatic: Aortic atherosclerosis. No enlarged abdominal or pelvic lymph nodes. Reproductive: Status post hysterectomy. No adnexal masses. Other: Negative for pelvic effusion or free air Musculoskeletal: No acute or suspicious osseous abnormality IMPRESSION: 1. No CT evidence for acute intra-abdominal or pelvic abnormality. 2. Scattered diverticular disease of the colon without acute inflammatory process. 3. Distended urinary bladder. 4. Aortic atherosclerosis. Aortic Atherosclerosis (ICD10-I70.0). Electronically Signed   By: Jasmine Pang M.D.   On: 01/21/2024 19:54     LOS: 1 day   Total time spent in review of labs and imaging, patient evaluation, formulation of plan, documentation and communication with family: 35 minutes  Lanae Boast, MD  [facility]  01/22/2024, 10:28 AM

## 2024-01-22 NOTE — Plan of Care (Signed)
  Problem: Coping: Goal: Ability to adjust to condition or change in health will improve Outcome: Progressing   Problem: Fluid Volume: Goal: Ability to maintain a balanced intake and output will improve Outcome: Progressing   Problem: Activity: Goal: Risk for activity intolerance will decrease Outcome: Progressing   Problem: Nutrition: Goal: Adequate nutrition will be maintained Outcome: Progressing   Problem: Safety: Goal: Ability to remain free from injury will improve Outcome: Progressing

## 2024-01-23 DIAGNOSIS — R112 Nausea with vomiting, unspecified: Secondary | ICD-10-CM | POA: Diagnosis not present

## 2024-01-23 LAB — CBC
HCT: 37.4 % (ref 36.0–46.0)
Hemoglobin: 11.6 g/dL — ABNORMAL LOW (ref 12.0–15.0)
MCH: 27.9 pg (ref 26.0–34.0)
MCHC: 31 g/dL (ref 30.0–36.0)
MCV: 89.9 fL (ref 80.0–100.0)
Platelets: 307 10*3/uL (ref 150–400)
RBC: 4.16 MIL/uL (ref 3.87–5.11)
RDW: 15.5 % (ref 11.5–15.5)
WBC: 8 10*3/uL (ref 4.0–10.5)
nRBC: 0 % (ref 0.0–0.2)

## 2024-01-23 LAB — GLUCOSE, CAPILLARY
Glucose-Capillary: 154 mg/dL — ABNORMAL HIGH (ref 70–99)
Glucose-Capillary: 187 mg/dL — ABNORMAL HIGH (ref 70–99)
Glucose-Capillary: 159 mg/dL — ABNORMAL HIGH (ref 70–99)
Glucose-Capillary: 132 mg/dL — ABNORMAL HIGH (ref 70–99)
Glucose-Capillary: 104 mg/dL — ABNORMAL HIGH (ref 70–99)

## 2024-01-23 LAB — COMPREHENSIVE METABOLIC PANEL
ALT: 15 U/L (ref 0–44)
AST: 13 U/L — ABNORMAL LOW (ref 15–41)
Albumin: 3.2 g/dL — ABNORMAL LOW (ref 3.5–5.0)
Alkaline Phosphatase: 43 U/L (ref 38–126)
Anion gap: 10 (ref 5–15)
BUN: 15 mg/dL (ref 6–20)
CO2: 25 mmol/L (ref 22–32)
Calcium: 8.6 mg/dL — ABNORMAL LOW (ref 8.9–10.3)
Chloride: 101 mmol/L (ref 98–111)
Creatinine, Ser: 0.59 mg/dL (ref 0.44–1.00)
GFR, Estimated: >60 mL/min (ref >60)
Glucose, Bld: 186 mg/dL — ABNORMAL HIGH (ref 70–99)
Potassium: 3.6 mmol/L (ref 3.5–5.1)
Sodium: 136 mmol/L (ref 135–145)
Total Bilirubin: 0.6 mg/dL (ref 0.0–1.2)
Total Protein: 5.6 g/dL — ABNORMAL LOW (ref 6.5–8.1)

## 2024-01-23 MED ORDER — DICYCLOMINE HCL 10 MG PO CAPS
10.0000 mg | ORAL_CAPSULE | Freq: Three times a day (TID) | ORAL | Status: DC
  Administered 2024-01-23 – 2024-01-26 (×14): 10 mg via ORAL
  Filled 2024-01-23 (×14): qty 1

## 2024-01-23 MED ORDER — SODIUM CHLORIDE 0.9 % IV SOLN: INTRAVENOUS | Status: AC

## 2024-01-23 NOTE — Progress Notes (Signed)
   01/23/24 1316  TOC Brief Assessment  Insurance and Status Reviewed  Patient has primary care physician Yes  Home environment has been reviewed Home w/ spouse  Prior level of function: Independent  Prior/Current Home Services No current home services  Social Drivers of Health Review SDOH reviewed no interventions necessary  Readmission risk has been reviewed Yes  Transition of care needs no transition of care needs at this time

## 2024-01-23 NOTE — Progress Notes (Signed)
PROGRESS NOTE [name] [name]  QMV:[id number] DOB: [birth year] DOA: 01/21/2024 PCP: Everardo All, NP  Brief Narrative/Hospital Course: 64 y.o. female with past medical history HTN, HLD, hypothyroidism, IBS, chronic diastolic CHF CKD II, migraine, diabetes mellitus with recent admission 2/1 to 2/4 for emphysematous cystitis and E. coli UTI, discharged on Bactrim x 10 days with plan for urology follow-up for cystoscopy and uncontrolled diabetes presented to the ED with lower abdominal pain, nausea vomiting difficulty urinating with increased pain in the epigastric region. In the ED vitals: Stable afebrile Labs: CMP stable with hyperglycemia 286, normal CBC UA with WBC 0-5 nitrite LE negative.  Normal LFTs lipase 125. CT abdomen pelvis with contrast>> no acute intra-abdominal or pelvic abnormality, scattered diverticular disease of the colon, distended urinary bladder, aortic atherosclerosis were noticed. 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 coli UTI on Bactrim ?  Pancreatitis: Unclear etiology, ?  Due to Bactrim versus recent cystitis/distended Bladder. CT abdomen pelvis LFTs labs unremarkable other than elevated lipase at 125-unclear significance given normal pancreas on the imaging, it could represent  dehydrational state. Denies any etoh use. Repeat lipase normal.  Did not tolerate soft diet to clear liquid diet, resume IV fluids, continue Zofran, Phenergan prn Continue PPI, and symptomatic management.  Diabetes mellitus with uncontrolled hyperglycemia: Changed IVF from dextrose to NS.  Blood sugar stable on ssi and low dose long-acting insulin Recent Labs  Lab 01/22/24 1625 01/22/24 2126 01/23/24 0451 01/23/24 0742 01/23/24 1157  GLUCAP 161* 118* 154* 187* 159*     CKD Stage II: Renal function is stable at baseline  Chronic diastolic CHF Hypertension: BP currently stable.  Volume status stable.    Hyperlipidemia Atherosclerosis incidentally noted on CT: Follow-up with PCP continue statins  Hypothyroidism: Continue home Synthroid   DVT prophylaxis: enoxaparin (LOVENOX) injection 40 mg Start: 01/22/24 1000 Code Status:   Code Status: Full Code Family Communication: plan of care discussed with patient at bedside. Patient status is: Remains hospitalized because of severity of illness Level of care: Med-Surg   Dispo: The patient is from: home            Anticipated disposition: Home in 24 hours Objective: Vitals last 24 hrs: Vitals:   01/22/24 1252 01/22/24 1626 01/22/24 2124 01/23/24 0452  BP: (!) 179/100 (!) 138/92 110/76 115/77  Pulse: 86 91 72 81  Resp: 16 16 18 18   Temp: 97.6 F (36.4 C) 98.1 F (36.7 C) 98 F (36.7 C) 98.5 F (36.9 C)  TempSrc:      SpO2: 100% 99% 100% 97%  Weight:      Height:       Weight change:   Physical Examination: General exam: alert awake, oriented at baseline, older than stated age HEENT:Oral mucosa moist, Ear/Nose WNL grossly Respiratory system: Bilaterally clear BS,no use of accessory  muscle Cardiovascular system: S1 & S2 +, No JVD. Gastrointestinal system: Abdomen soft, mildly tender central abdomen ,ND, BS+ Nervous System: Alert, awake, moving all extremities,and following commands. Extremities: LE edema neg,distal  peripheral pulses palpable and warm.  Skin: No rashes,no icterus. MSK: Normal muscle bulk,tone, power   Medications reviewed:  Scheduled Meds:  aspirin EC  81 mg Oral Daily   dicyclomine  10 mg Oral TID AC & HS   enoxaparin (LOVENOX) injection  40 mg Subcutaneous Q24H   escitalopram  10 mg Oral Daily    HYDROmorphone (DILAUDID) injection  1 mg Intravenous Once   insulin aspart  0-5 Units Subcutaneous QHS   insulin aspart  0-6 Units Subcutaneous TID WC   insulin glargine-yfgn  5 Units Subcutaneous Daily   levothyroxine  137 mcg Oral Q0600   pantoprazole (PROTONIX) IV  40 mg Intravenous Daily   sulfamethoxazole-trimethoprim  1 tablet Oral Q12H   tamsulosin  0.4 mg Oral QPC supper   Continuous Infusions:  sodium chloride 40 mL/hr at 01/23/24 1234   promethazine (PHENERGAN) injection (IM or IVPB) 6.25 mg (01/22/24 1424)    Diet Order             DIET DYS 3 Room service appropriate? Yes; Fluid consistency: Thin  Diet effective now                   Intake/Output Summary (Last 24 hours) at 01/23/2024 1242 Last data filed at 01/23/2024 0903 Gross per 24 hour  Intake 739.43 ml  Output --  Net 739.43 ml   Net IO Since Admission: 1,687.79 mL [01/23/24 1242]  Wt Readings from Last 3 Encounters:  01/22/24 55.1 kg  01/16/24 56.4 kg  01/14/24 53.7 kg     Unresulted Labs (From admission, onward)     Start     Ordered   01/28/24 0500  Creatinine, serum  (enoxaparin (LOVENOX)    CrCl >/= 30 ml/min)  Weekly,   R     Comments: while on enoxaparin therapy    01/21/24 2342           Data Reviewed: I have personally reviewed following labs and imaging studies CBC: Recent Labs  Lab 01/21/24 1357 01/22/24 0026 01/22/24 0506 01/23/24 0555  WBC 9.4 8.8 12.1* 8.0  HGB 13.8 12.2 13.5 11.6*  HCT 42.7 37.6 42.8 37.4  MCV 85.2 86.2 86.8 89.9  PLT 384 371 434* 307   Basic Metabolic Panel:  Recent Labs  Lab 01/21/24 1357 01/22/24 0026 01/22/24 0506 01/23/24 0555  NA  136  --  131* 136  K 3.7  --  3.6 3.6  CL 97*  --  93* 101  CO2 27  --  26 25  GLUCOSE 286*  --  284* 186*  BUN 14  --  16 15  CREATININE 0.76 0.75 0.71 0.59  CALCIUM 9.2  --  8.9 8.6*   GFR: Estimated Creatinine Clearance: 56.7 mL/min (by C-G formula based on SCr of 0.59 mg/dL). Liver Function Tests:  Recent Labs  Lab 01/21/24 1357 01/22/24 0506 01/23/24 0555  AST 16 19 13*  ALT 15 15 15   ALKPHOS 60 60 43  BILITOT 0.6 0.7 0.6  PROT 7.2 7.4 5.6*  ALBUMIN 4.0 3.9 3.2*   Recent Labs  Lab 01/21/24 1357 01/22/24 0506  LIPASE 125* 41   No results for input(s): "AMMONIA" in the last 168 hours. No results found for this or any previous visit (from the past 240 hours).  Antimicrobials/Microbiology:  Anti-infectives (From admission, onward)    Start     Dose/Rate Route Frequency Ordered Stop   01/22/24 1030  sulfamethoxazole-trimethoprim (BACTRIM DS) 800-160 MG per tablet 1 tablet        1 tablet Oral Every 12 hours 01/22/24 1028           Component Value Date/Time   SDES  01/11/2024 2031    BLOOD RIGHT HAND Performed at [facility], [address] [place], [place] [address]    SPECREQUEST  01/11/2024 2031    BOTTLES DRAWN AEROBIC AND ANAEROBIC Blood Culture results may not be optimal due to an inadequate volume of blood received in culture bottles Performed at [facility], [address], [place], [place] [address]    CULT  01/11/2024 2031    NO GROWTH 5 DAYS Performed at [facility], [address], [place], [place] [address]    REPTSTATUS 01/17/2024 FINAL 01/11/2024 2031     Radiology Studies: CT ABDOMEN PELVIS W CONTRAST Result Date: 01/21/2024 CLINICAL DATA:  Abdomen pain nausea EXAM: CT ABDOMEN AND PELVIS WITH CONTRAST TECHNIQUE: Multidetector CT imaging of the abdomen and pelvis was performed using the standard protocol following bolus administration of intravenous contrast. RADIATION DOSE REDUCTION: This exam was performed  according to the departmental dose-optimization program which includes automated exposure control, adjustment of the mA and/or kV according to patient size and/or use of iterative reconstruction technique. CONTRAST:  OMNIPAQUE IOHEXOL 300 MG/ML  SOLN COMPARISON:  CT 01/11/2024, 01/10/2024 FINDINGS: Lower chest: Lung bases are clear Hepatobiliary: No focal liver abnormality is seen. No gallstones, gallbladder wall thickening, or biliary dilatation. Pancreas: Unremarkable. No pancreatic ductal dilatation or surrounding inflammatory changes. Spleen: Normal in size without focal abnormality. Adrenals/Urinary Tract: Adrenal glands are normal. Kidneys show no hydronephrosis. Distended urinary bladder Stomach/Bowel: Stomach is within normal limits. Scattered diverticular disease of the colon. No evidence of bowel wall thickening, distention, or inflammatory changes. Vascular/Lymphatic: Aortic atherosclerosis. No enlarged abdominal or pelvic lymph nodes. Reproductive: Status post hysterectomy. No adnexal masses. Other: Negative for pelvic effusion or free air Musculoskeletal: No acute or suspicious osseous abnormality IMPRESSION: 1. No CT evidence for acute intra-abdominal or pelvic abnormality. 2. Scattered diverticular disease of the colon without acute inflammatory process. 3. Distended urinary bladder. 4. Aortic atherosclerosis. Aortic Atherosclerosis (ICD10-I70.0). Electronically Signed   By: Jasmine Pang M.D.   On: 01/21/2024 19:54     LOS: 2 days   Total time spent in review of labs and imaging, patient evaluation, formulation of plan, documentation and communication with family: 35 minutes  Lanae Boast, MD  [facility]  01/23/2024, 12:42 PM

## 2024-01-23 NOTE — Inpatient Diabetes Management (Signed)
Inpatient Diabetes Program Recommendations  AACE/ADA: New Consensus Statement on Inpatient Glycemic Control   Target Ranges:  Prepandial:   less than 140 mg/dL      Peak postprandial:   less than 180 mg/dL (1-2 hours)      Critically ill patients:  140 - 180 mg/dL    Latest Reference Range & Units 01/22/24 08:15 01/22/24 12:20 01/22/24 16:25 01/22/24 21:26 01/23/24 04:51 01/23/24 07:42  Glucose-Capillary 70 - 99 mg/dL 147 (H)  Novolog 4 units 157 (H)  Novolog 1 unit 161 (H)  Novolog 1 unit 118 (H) 154 (H) 187 (H)   Review of Glycemic Control  Diabetes history: DM2 Outpatient Diabetes medications: Toujeo 25 units at bedtime, Novolog 10 units TID with meals, Dexcom G6 CGM Current orders for Inpatient glycemic control: Semglee 5 units daily, Novolog 0-6 units TID with meals, Novolog 0-5 units QHS   Inpatient Diabetes Program Recommendations:     Insulin: In reviewing chart, noted Semglee was NOT GIVEN on 01/22/24 (per [facility], patient refused).  Once diet advanced, consider ordering Novolog 2 units TID with meals for meal coverage if patient eats at least 50% of meals.  Thanks, Orlando Penner, RN, MSN, CDCES Diabetes Coordinator Inpatient Diabetes Program [phone] (Team Pager from 8am to 5pm)

## 2024-01-23 NOTE — Progress Notes (Signed)
Mobility Specialist - Progress Note   01/23/24 1442  Mobility  Activity Ambulated independently in hallway  Level of Assistance Independent  Assistive Device None  Distance Ambulated (ft) 275 ft  Activity Response Tolerated well  Mobility Referral Yes  Mobility visit 1 Mobility  Mobility Specialist Start Time (ACUTE ONLY) 1431  Mobility Specialist Stop Time (ACUTE ONLY) 1442  Mobility Specialist Time Calculation (min) (ACUTE ONLY) 11 min   Pt received in bed and agreeable to mobility. No complaints during session. Pt to bed after session with all needs met.    [facility]

## 2024-01-24 DIAGNOSIS — R112 Nausea with vomiting, unspecified: Secondary | ICD-10-CM | POA: Diagnosis not present

## 2024-01-24 LAB — GLUCOSE, CAPILLARY
Glucose-Capillary: 105 mg/dL — ABNORMAL HIGH (ref 70–99)
Glucose-Capillary: 156 mg/dL — ABNORMAL HIGH (ref 70–99)
Glucose-Capillary: 80 mg/dL (ref 70–99)
Glucose-Capillary: 85 mg/dL (ref 70–99)

## 2024-01-24 NOTE — Plan of Care (Signed)
  Problem: Skin Integrity: Goal: Risk for impaired skin integrity will decrease Outcome: Progressing   Problem: Activity: Goal: Risk for activity intolerance will decrease Outcome: Progressing   Problem: Coping: Goal: Level of anxiety will decrease Outcome: Progressing   Problem: Safety: Goal: Ability to remain free from injury will improve Outcome: Progressing   Problem: Skin Integrity: Goal: Risk for impaired skin integrity will decrease Outcome: Progressing

## 2024-01-24 NOTE — Progress Notes (Addendum)
Progress Note   Patient: [name] [name] [id number] DOB: 1961-11-29 DOA: 01/21/2024     3 DOS: the patient was seen and examined on 01/24/2024   Brief hospital course: 63 y.o. female with past medical history HTN, HLD, hypothyroidism, IBS, chronic diastolic CHF CKD II, migraine, diabetes mellitus with recent admission 2/1 to 2/4 for emphysematous cystitis and E. coli UTI, discharged on Bactrim x 10 days with plan for urology follow-up for cystoscopy and uncontrolled diabetes presented to the ED with lower abdominal pain, nausea vomiting difficulty urinating with increased pain in the epigastric region. In the ED vitals: Stable afebrile Labs: CMP stable with hyperglycemia 286, normal CBC UA with WBC 0-5 nitrite LE negative.  Normal LFTs lipase 125. CT abdomen pelvis with contrast>> no acute intra-abdominal or pelvic abnormality, scattered diverticular disease of the colon, distended urinary bladder, aortic atherosclerosis were noticed. Patient is admitted for further management      Assessment and Plan: No notes have been filed under this hospital service. Service: Hospitalist  Nauseous vomiting abdominal pain Probably recurrent acute pancreatitis -Review of patient record showed patient has had at least 3 admissions since last year with similar symptoms of acute nauseous vomiting abdominal pain, some occasions with mild elevated blood lipase level and sometimes with normal lipase level.  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 -The patient continued to experience frequent nauseous vomiting and abdominal pain especially after eating solid food.  For which she had to downgrade back to liquid diet herself and feeling some  relief. -Continue supportive care as needed Zofran and IV pain medications. -Other Ddx, will send IgG subgroups to rule out autoimmune pancreatitis  Recent acute emphysematous cystitis and E. coli UTI -Stable, completed on 10 days course of Bactrim, D/C tomorrow  CKD stage II -Euvolemic, creatinine level stable  HTN Chronic HFpEF -Euvolemic, BP controlled  IDDM -Glucose stable, continue current insulin regimen    Subjective: Patient continued to experience frequent breakthrough pains as well as nauseous vomited of stomach content after eating solid diet yesterday and had to go back to liquid diet for dinner with some relief.  Physical Exam: Vitals:   01/23/24 1301 01/23/24 2216 01/24/24 0533 01/24/24 1321  BP: 133/80 (!) 94/59 110/71 138/78  Pulse: 82 71 73 89  Resp: 15 18 18 20   Temp: 97.9 F (36.6 C) 98.6 F (37 C) 98.5 F (36.9 C) 98.7 F (37.1 C)  TempSrc:      SpO2: 99% 99% 97% 100%  Weight:      Height:       Eyes: PERRL, lids and conjunctivae normal ENMT: Mucous membranes are moist. Posterior pharynx clear of any exudate or lesions.Normal dentition.  Neck: normal, supple, no masses, no thyromegaly Respiratory: clear to auscultation bilaterally, no wheezing, no crackles. Normal respiratory effort. No accessory muscle use.  Cardiovascular: Regular rate and rhythm, no murmurs / rubs / gallops. No extremity edema. 2+ pedal pulses. No carotid bruits.  Abdomen: On epigastric area, no rebound no guarding, no masses palpated. No hepatosplenomegaly. Bowel sounds positive.  Musculoskeletal: no clubbing / cyanosis. No joint deformity upper and lower extremities. Good ROM, no contractures. Normal muscle tone.  Skin: no rashes, lesions, ulcers. No induration Neurologic: CN 2-12 grossly intact. Sensation intact, DTR normal.  Muscle strength 5/5 on both sides Psychiatric: Normal  judgment and insight. Alert and oriented x 3. Normal mood.    Data Reviewed: Past MRCP, EGD guided  [place] results revealed  Family Communication: None at bedside  Disposition: Status is: Inpatient Remains inpatient appropriate because: Patient continued to experience severe GI symptoms unable to tolerate regular diet, continue to need IV pain medications and IV Zofran for symptomatic control, continue to need inpatient care  Planned Discharge Destination: Home    Time spent: 50 minutes  Author: Emeline General, MD 01/24/2024 4:43 PM  For on call review [email or web address].

## 2024-01-25 DIAGNOSIS — K859 Acute pancreatitis without necrosis or infection, unspecified: Secondary | ICD-10-CM | POA: Diagnosis not present

## 2024-01-25 LAB — LIPASE, BLOOD: Lipase: 21 U/L (ref 11–51)

## 2024-01-25 LAB — COMPREHENSIVE METABOLIC PANEL
ALT: 14 U/L (ref 0–44)
AST: 16 U/L (ref 15–41)
Albumin: 3.9 g/dL (ref 3.5–5.0)
Alkaline Phosphatase: 46 U/L (ref 38–126)
Anion gap: 10 (ref 5–15)
BUN: 9 mg/dL (ref 6–20)
CO2: 24 mmol/L (ref 22–32)
Calcium: 9 mg/dL (ref 8.9–10.3)
Chloride: 98 mmol/L (ref 98–111)
Creatinine, Ser: 0.84 mg/dL (ref 0.44–1.00)
GFR, Estimated: >60 mL/min (ref >60)
Glucose, Bld: 135 mg/dL — ABNORMAL HIGH (ref 70–99)
Potassium: 4.5 mmol/L (ref 3.5–5.1)
Sodium: 132 mmol/L — ABNORMAL LOW (ref 135–145)
Total Bilirubin: 0.8 mg/dL (ref 0.0–1.2)
Total Protein: 6.7 g/dL (ref 6.5–8.1)

## 2024-01-25 LAB — CBC
HCT: 38.3 % (ref 36.0–46.0)
Hemoglobin: 12.4 g/dL (ref 12.0–15.0)
MCH: 28.3 pg (ref 26.0–34.0)
MCHC: 32.4 g/dL (ref 30.0–36.0)
MCV: 87.4 fL (ref 80.0–100.0)
Platelets: 327 10*3/uL (ref 150–400)
RBC: 4.38 MIL/uL (ref 3.87–5.11)
RDW: 15.6 % — ABNORMAL HIGH (ref 11.5–15.5)
WBC: 7.7 10*3/uL (ref 4.0–10.5)
nRBC: 0 % (ref 0.0–0.2)

## 2024-01-25 LAB — GLUCOSE, CAPILLARY
Glucose-Capillary: 154 mg/dL — ABNORMAL HIGH (ref 70–99)
Glucose-Capillary: 104 mg/dL — ABNORMAL HIGH (ref 70–99)
Glucose-Capillary: 97 mg/dL (ref 70–99)
Glucose-Capillary: 134 mg/dL — ABNORMAL HIGH (ref 70–99)

## 2024-01-25 MED ORDER — METOCLOPRAMIDE HCL 10 MG PO TABS
5.0000 mg | ORAL_TABLET | Freq: Three times a day (TID) | ORAL | Status: DC
  Administered 2024-01-25 – 2024-01-26 (×3): 5 mg via ORAL
  Filled 2024-01-25 (×2): qty 1

## 2024-01-25 MED ORDER — QUETIAPINE FUMARATE 25 MG PO TABS
50.0000 mg | ORAL_TABLET | Freq: Every day | ORAL | Status: DC
  Administered 2024-01-25: 50 mg via ORAL
  Filled 2024-01-25: qty 2

## 2024-01-25 MED ORDER — PANTOPRAZOLE SODIUM 40 MG PO TBEC
40.0000 mg | DELAYED_RELEASE_TABLET | Freq: Two times a day (BID) | ORAL | Status: DC
  Administered 2024-01-25 – 2024-01-26 (×2): 40 mg via ORAL
  Filled 2024-01-25 (×2): qty 1

## 2024-01-25 NOTE — Progress Notes (Signed)
PROGRESS NOTE    [name] [name]  NFA:[id number] DOB: Mar 31, 1961 DOA: 01/21/2024 PCP: Everardo All, NP    Brief Narrative:   [name] [name] is a 63 y.o. female with past medical history significant for HTN, HLD, hypothyroidism, IBS, chronic diastolic congestive heart failure, CKD stage II, type 2 diabetes mellitus, history of DKA, gastroparesis, PUD who presented to [facility] ED on 01/21/2024 via EMS from home with complaints of abdominal pain and nausea.  Pain is reported 9 out of 10 in the entire abdomen, worse in the epigastric region.  Denies diarrhea, no blood in her stool.  Denies alcohol abuse and no history of gallstones.  In the ED, temperature 98.0 F, HR 89, RR 16, BP 118/84, SpO2 100% on room air.  WBC 9.4, hemoglobin 13.8, platelet count 384.  Sodium 136, potassium 3.7, chloride 97, CO2 27, glucose 286, BUN 14, creatinine 0.76.  AST 16, ALT 15, total bilirubin 0.6.  Lipase 125.  Urinalysis with greater than 500 glucose, otherwise unrevealing.  CT abdomen/pelvis with no evidence for acute intra-abdominal or pelvic abnormality, scattered diverticular disease of the colon without acute inflammatory process, distended urinary bladder, aortic atherosclerosis.  TRH consulted for admission for further evaluation management of acute pancreatitis.  Assessment & Plan:   Acute pancreatitis Patient presenting to ED with abdominal pain associate with nausea, worse in the epigastric region.  Denies alcohol abuse and no history of gallstones.  Patient was afebrile without leukocytosis.  Lipase elevated 125.  CT Abdo/pelvis with no acute findings. -- Lipase 125> 41> 21 -- Tramadol 50 mg p.o. every 6 hours.  Moderate pain -- Dilaudid 1 mg IV every 2 hours as needed severe pain -- Advance to full liquid diet  Essential hypertension Chronic diastolic congestive heart failure, compensated TTE 11/23/2021 with LVEF 55-60%, grade 1 diastolic dysfunction, trivial MR, no aortic stenosis, IVC  normal in size.  Currently not on antihypertensives outpatient. -- BP 102/76, stable  Type 2 diabetes mellitus with hyperglycemia Hemoglobin A1c 12.1 on 12/07/2023, poorly controlled Home regimen includes Toujeo 25 units subcutaneously nightly, NovoLog 10 units 3 times daily AC -- Hold basal insulin for now -- SSI for coverage -- CBG before every meal/at bedtime  CKD stage II Creatinine 0.84, stable  Hypothyroidism --Levothyroxine 137 mcg p.o. daily  Hyperlipidemia -- Resume Crestor on discharge  IBS -- Bentyl 10 mg p.o. 3 times daily  Anxiety/depression -- Lexapro 10 mg p.o. daily  Recent urinary tract infection -- Bactrim 800-160 mg p.o. every 12 hours  Gastroparesis -- Reglan 5 mg p.o. 3 times daily before meals  Peptic ulcer disease --Protonix 40 mg p.o. twice daily  Mood disorder -- Seroquel 50 mg p.o. nightly   DVT prophylaxis: enoxaparin (LOVENOX) injection 40 mg Start: 01/22/24 1000    Code Status: Full Code Family Communication: No family present bedside this morning  Disposition Plan:  Level of care: Med-Surg Status is: Inpatient Remains inpatient appropriate because: Needs further advancement of diet    Consultants:  None  Procedures:  None  Antimicrobials:  None   Subjective: Patient seen examined bedside, resting comfortably.  Lying in bed.  Tolerating clear liquid diet.  Abdominal pain improving.  No further nausea/vomiting.  Lipase continues to trend down.  Will transition to full liquid diet today.  No other specific complaints, concerns or questions at this time.  Denies headache, no dizziness, no chest pain, no palpitations, no shortness of breath, no fever/chills/night sweats, no nausea/vomiting/diarrhea, no focal weakness, no fatigue,  no paresthesia.  No acute events overnight per nursing.  Objective: Vitals:   01/24/24 1321 01/24/24 2002 01/25/24 0509 01/25/24 1242  BP: 138/78 101/61 94/67 102/76  Pulse: 89 73 74 79  Resp: 20 16  16 13   Temp: 98.7 F (37.1 C) 98.2 F (36.8 C) 98.1 F (36.7 C) 98 F (36.7 C)  TempSrc:  Oral Oral   SpO2: 100% 100% 98% 98%  Weight:      Height:       No intake or output data in the 24 hours ending 01/25/24 1732 Filed Weights   01/22/24 1246  Weight: 55.1 kg    Examination:  Physical Exam: GEN: NAD, alert and oriented x 3, chronically ill appearance HEENT: NCAT, PERRL, EOMI, sclera clear, MMM PULM: CTAB w/o wheezes/crackles, normal respiratory effort, on room air CV: RRR w/o M/G/R GI: abd soft, slight TTP epigastric region, nondistended  + BS MSK: no peripheral edema, muscle strength globally intact 5/5 bilateral upper/lower extremities NEURO: CN II-XII intact, no focal deficits, sensation to light touch intact PSYCH: normal mood/affect Integumentary: dry/intact, no rashes or wounds    Data Reviewed: I have personally reviewed following labs and imaging studies  CBC: Recent Labs  Lab 01/21/24 1357 01/22/24 0026 01/22/24 0506 01/23/24 0555 01/25/24 0828  WBC 9.4 8.8 12.1* 8.0 7.7  HGB 13.8 12.2 13.5 11.6* 12.4  HCT 42.7 37.6 42.8 37.4 38.3  MCV 85.2 86.2 86.8 89.9 87.4  PLT 384 371 434* 307 327   Basic Metabolic Panel: Recent Labs  Lab 01/21/24 1357 01/22/24 0026 01/22/24 0506 01/23/24 0555 01/25/24 0828  NA 136  --  131* 136 132*  K 3.7  --  3.6 3.6 4.5  CL 97*  --  93* 101 98  CO2 27  --  26 25 24   GLUCOSE 286*  --  284* 186* 135*  BUN 14  --  16 15 9   CREATININE 0.76 0.75 0.71 0.59 0.84  CALCIUM 9.2  --  8.9 8.6* 9.0   GFR: Estimated Creatinine Clearance: 54 mL/min (by C-G formula based on SCr of 0.84 mg/dL). Liver Function Tests: Recent Labs  Lab 01/21/24 1357 01/22/24 0506 01/23/24 0555 01/25/24 0828  AST 16 19 13* 16  ALT 15 15 15 14   ALKPHOS 60 60 43 46  BILITOT 0.6 0.7 0.6 0.8  PROT 7.2 7.4 5.6* 6.7  ALBUMIN 4.0 3.9 3.2* 3.9   Recent Labs  Lab 01/21/24 1357 01/22/24 0506 01/25/24 0828  LIPASE 125* 41 21   No results  for input(s): "AMMONIA" in the last 168 hours. Coagulation Profile: No results for input(s): "INR", "PROTIME" in the last 168 hours. Cardiac Enzymes: No results for input(s): "CKTOTAL", "CKMB", "CKMBINDEX", "TROPONINI" in the last 168 hours. BNP (last 3 results) No results for input(s): "PROBNP" in the last 8760 hours. HbA1C: No results for input(s): "HGBA1C" in the last 72 hours. CBG: Recent Labs  Lab 01/24/24 1558 01/24/24 2124 01/25/24 0826 01/25/24 1130 01/25/24 1643  GLUCAP 80 85 154* 104* 97   Lipid Profile: No results for input(s): "CHOL", "HDL", "LDLCALC", "TRIG", "CHOLHDL", "LDLDIRECT" in the last 72 hours. Thyroid Function Tests: No results for input(s): "TSH", "T4TOTAL", "FREET4", "T3FREE", "THYROIDAB" in the last 72 hours. Anemia Panel: No results for input(s): "VITAMINB12", "FOLATE", "FERRITIN", "TIBC", "IRON", "RETICCTPCT" in the last 72 hours. Sepsis Labs: No results for input(s): "PROCALCITON", "LATICACIDVEN" in the last 168 hours.  No results found for this or any previous visit (from the past 240 hours).  Radiology Studies: No results found.      Scheduled Meds:  aspirin EC  81 mg Oral Daily   dicyclomine  10 mg Oral TID AC & HS   enoxaparin (LOVENOX) injection  40 mg Subcutaneous Q24H   escitalopram  10 mg Oral Daily    HYDROmorphone (DILAUDID) injection  1 mg Intravenous Once   insulin aspart  0-5 Units Subcutaneous QHS   insulin aspart  0-6 Units Subcutaneous TID WC   insulin glargine-yfgn  5 Units Subcutaneous Daily   levothyroxine  137 mcg Oral Q0600   pantoprazole (PROTONIX) IV  40 mg Intravenous Daily   sulfamethoxazole-trimethoprim  1 tablet Oral Q12H   tamsulosin  0.4 mg Oral QPC supper   Continuous Infusions:  promethazine (PHENERGAN) injection (IM or IVPB) 6.25 mg (01/25/24 1437)     LOS: 4 days    Time spent: 52 minutes spent on chart review, discussion with nursing staff, consultants, updating family and  interview/physical exam; more than 50% of that time was spent in counseling and/or coordination of care.    Alvira Philips [place], DO [facility] Available via Epic secure chat 7am-7pm After these hours, please refer to coverage provider listed on [email or web address] 01/25/2024, 5:32 PM

## 2024-01-25 NOTE — Plan of Care (Signed)

## 2024-01-26 DIAGNOSIS — K8500 Idiopathic acute pancreatitis without necrosis or infection: Secondary | ICD-10-CM | POA: Diagnosis not present

## 2024-01-26 LAB — BASIC METABOLIC PANEL
Anion gap: 8 (ref 5–15)
BUN: 9 mg/dL (ref 6–20)
CO2: 24 mmol/L (ref 22–32)
Calcium: 8.9 mg/dL (ref 8.9–10.3)
Chloride: 103 mmol/L (ref 98–111)
Creatinine, Ser: 0.89 mg/dL (ref 0.44–1.00)
GFR, Estimated: >60 mL/min (ref >60)
Glucose, Bld: 102 mg/dL — ABNORMAL HIGH (ref 70–99)
Potassium: 4.2 mmol/L (ref 3.5–5.1)
Sodium: 135 mmol/L (ref 135–145)

## 2024-01-26 LAB — LIPASE, BLOOD: Lipase: 21 U/L (ref 11–51)

## 2024-01-26 LAB — GLUCOSE, CAPILLARY
Glucose-Capillary: 105 mg/dL — ABNORMAL HIGH (ref 70–99)
Glucose-Capillary: 284 mg/dL — ABNORMAL HIGH (ref 70–99)

## 2024-01-26 NOTE — Progress Notes (Signed)
Mobility Specialist - Progress Note   01/26/24 1258  Mobility  Activity Ambulated independently in hallway  Level of Assistance Independent  Assistive Device None  Distance Ambulated (ft) 500 ft  Activity Response Tolerated well  Mobility Referral Yes  Mobility visit 1 Mobility  Mobility Specialist Start Time (ACUTE ONLY) 1246  Mobility Specialist Stop Time (ACUTE ONLY) 1256  Mobility Specialist Time Calculation (min) (ACUTE ONLY) 10 min   Pt received in bed and agreeable to mobility. No complaints during session. Pt to bed after session with all needs met.    [facility]

## 2024-01-26 NOTE — Plan of Care (Signed)

## 2024-01-26 NOTE — Discharge Summary (Signed)
Physician Discharge Summary  [name] [name] ZOX:[id number] DOB: Dec 05, 1961 DOA: 01/21/2024  PCP: Everardo All, NP  Admit date: 01/21/2024 Discharge date: 01/26/2024  Admitted From: Home Disposition: Home  Recommendations for Outpatient Follow-up:  Follow up with PCP in 1-2 weeks  Home Health: No Equipment/Devices: None  Discharge Condition: Stable CODE STATUS: Full code Diet recommendation: Heart healthy diet  History of present illness:  [name] [name] is a 63 y.o. female with past medical history significant for HTN, HLD, hypothyroidism, IBS, chronic diastolic congestive heart failure, CKD stage II, type 2 diabetes mellitus, history of DKA, gastroparesis, PUD who presented to [facility] ED on 01/21/2024 via EMS from home with complaints of abdominal pain and nausea.  Pain is reported 9 out of 10 in the entire abdomen, worse in the epigastric region.  Denies diarrhea, no blood in her stool.  Denies alcohol abuse and no history of gallstones.   In the ED, temperature 98.0 F, HR 89, RR 16, BP 118/84, SpO2 100% on room air.  WBC 9.4, hemoglobin 13.8, platelet count 384.  Sodium 136, potassium 3.7, chloride 97, CO2 27, glucose 286, BUN 14, creatinine 0.76.  AST 16, ALT 15, total bilirubin 0.6.  Lipase 125.  Urinalysis with greater than 500 glucose, otherwise unrevealing.  CT abdomen/pelvis with no evidence for acute intra-abdominal or pelvic abnormality, scattered diverticular disease of the colon without acute inflammatory process, distended urinary bladder, aortic atherosclerosis.  TRH consulted for admission for further evaluation management of acute pancreatitis.  Hospital course:  Acute pancreatitis Patient presenting to ED with abdominal pain associate with nausea, worse in the epigastric region.  Denies alcohol abuse and no history of gallstones.  Patient was afebrile without leukocytosis.  Lipase elevated 125.  CT Abdo/pelvis with no acute findings.  Patient was supported  with IV fluid hydration, diet was slowly advanced with toleration.  Recommend continue low-fat diet on discharge.   Essential hypertension Chronic diastolic congestive heart failure, compensated TTE 11/23/2021 with LVEF 55-60%, grade 1 diastolic dysfunction, trivial MR, no aortic stenosis, IVC normal in size.  Currently not on antihypertensives outpatient.   Type 2 diabetes mellitus with hyperglycemia Hemoglobin A1c 12.1 on 12/07/2023, poorly controlled Home regimen includes Toujeo 25 units subcutaneously nightly, NovoLog 10 units 3 times daily AC.  Outpatient follow-up PCP for further guidance.  CKD stage II Creatinine 0.89, stable   Hypothyroidism Levothyroxine 137 mcg p.o. daily   Hyperlipidemia Continue Crestor    Anxiety/depression Lexapro 10 mg p.o. daily   Recent urinary tract infection Completed course of Bactrim.   Gastroparesis Continue Reglan as needed.   Peptic ulcer disease Protonix 40 mg p.o. twice daily   Mood disorder Seroquel 50 mg p.o. nightly    Discharge Diagnoses:  Principal Problem:   Nausea and vomiting Active Problems:   Uncontrolled type 1 diabetes mellitus with hyperglycemia, with long-term current use of insulin ([name])   non obstructive CAD (coronary artery disease)   Hypothyroidism   Chronic diastolic HF (heart failure) ([name])   Hyperlipidemia   GERD   Kidney disease, chronic, stage II (GFR 60-89 ml/min)   Paroxysmal A-fib [facility])    Discharge Instructions  Discharge Instructions     Call MD for:  difficulty breathing, headache or visual disturbances   Complete by: As directed    Call MD for:  extreme fatigue   Complete by: As directed    Call MD for:  persistant dizziness or light-headedness   Complete by: As directed    Call  MD for:  persistant nausea and vomiting   Complete by: As directed    Call MD for:  severe uncontrolled pain   Complete by: As directed    Call MD for:  temperature >100.4   Complete by: As directed     Diet - low sodium heart healthy   Complete by: As directed    Increase activity slowly   Complete by: As directed       Allergies as of 01/26/2024       Reactions   Codeine Hives, Anxiety, Other (See Comments)        Medication List     STOP taking these medications    sulfamethoxazole-trimethoprim 800-160 MG tablet Commonly known as: BACTRIM DS       TAKE these medications    aspirin EC 81 MG tablet Take 81 mg by mouth daily. Swallow whole.   azelastine 0.1 % nasal spray Commonly known as: ASTELIN Place 1 spray into both nostrils as needed for rhinitis or allergies.   Dexcom G6 Transmitter Misc USE AS DIRECTED FOR CONTINUOUS GLUCOSE MONITORING. REUSE TRANSMITTER X 90DAYS THEN DISCARD & REPLACE   escitalopram 10 MG tablet Commonly known as: LEXAPRO Take 1 tablet (10 mg total) by mouth daily.   Glucagon Emergency 1 MG Kit Inject 1 mg into the skin See admin instructions. Inject one mg into the skin see administration instructions. Follow package directions for low blood sugar.   levothyroxine 137 MCG tablet Commonly known as: SYNTHROID Take 137 mcg by mouth daily before breakfast.   metoCLOPramide 5 MG tablet Commonly known as: Reglan Take 1 tablet (5 mg total) by mouth 3 (three) times daily before meals.   NovoLOG FlexPen 100 UNIT/ML FlexPen Generic drug: insulin aspart Inject 10 Units into the skin 3 (three) times daily with meals. Plus sliding scale TID   omega-3 acid ethyl esters 1 g capsule Commonly known as: LOVAZA Take 2 capsules (2 g total) by mouth 2 (two) times daily.   ondansetron 4 MG tablet Commonly known as: ZOFRAN Take 2 tablets (8 mg total) by mouth every 8 (eight) hours as needed for nausea or vomiting.   ondansetron 8 MG disintegrating tablet Commonly known as: ZOFRAN-ODT Take 1 tablet (8 mg total) by mouth every 8 (eight) hours as needed for nausea or vomiting.   pantoprazole 40 MG tablet Commonly known as: Protonix Take 1 tablet  (40 mg total) by mouth 2 (two) times daily. Take 30 minutes before breakfast and dinner   QUEtiapine 50 MG tablet Commonly known as: SEROQUEL Take 50 mg by mouth at bedtime.   rosuvastatin 10 MG tablet Commonly known as: CRESTOR Take 1 tablet (10 mg total) by mouth daily.   sucralfate 1 g tablet Commonly known as: CARAFATE Take 1 tablet (1 g total) by mouth 2 (two) times daily. Do not take any other medication within 2-4 hours.   tamsulosin 0.4 MG Caps capsule Commonly known as: FLOMAX Take 1 capsule (0.4 mg total) by mouth daily after supper.   Toujeo SoloStar 300 UNIT/ML Solostar Pen Generic drug: insulin glargine (1 Unit Dial) Inject 25 Units into the skin at bedtime.   traMADol 50 MG tablet Commonly known as: ULTRAM 1 to 2 tablets every 6 hours with extra strength Tylenol for pain as needed What changed:  how much to take how to take this when to take this reasons to take this additional instructions   traZODone 50 MG tablet Commonly known as: DESYREL Take 50 mg by mouth  at bedtime as needed for sleep.        Follow-up Information     Everardo All, NP. Schedule an appointment as soon as possible for a visit in 1 week(s).   Specialty: Family Medicine               Allergies  Allergen Reactions   Codeine Hives, Anxiety and Other (See Comments)    Consultations: None   Procedures/Studies: CT ABDOMEN PELVIS W CONTRAST Result Date: 01/21/2024 CLINICAL DATA:  Abdomen pain nausea EXAM: CT ABDOMEN AND PELVIS WITH CONTRAST TECHNIQUE: Multidetector CT imaging of the abdomen and pelvis was performed using the standard protocol following bolus administration of intravenous contrast. RADIATION DOSE REDUCTION: This exam was performed according to the departmental dose-optimization program which includes automated exposure control, adjustment of the mA and/or kV according to patient size and/or use of iterative reconstruction technique. CONTRAST:   OMNIPAQUE IOHEXOL 300 MG/ML  SOLN COMPARISON:  CT 01/11/2024, 01/10/2024 FINDINGS: Lower chest: Lung bases are clear Hepatobiliary: No focal liver abnormality is seen. No gallstones, gallbladder wall thickening, or biliary dilatation. Pancreas: Unremarkable. No pancreatic ductal dilatation or surrounding inflammatory changes. Spleen: Normal in size without focal abnormality. Adrenals/Urinary Tract: Adrenal glands are normal. Kidneys show no hydronephrosis. Distended urinary bladder Stomach/Bowel: Stomach is within normal limits. Scattered diverticular disease of the colon. No evidence of bowel wall thickening, distention, or inflammatory changes. Vascular/Lymphatic: Aortic atherosclerosis. No enlarged abdominal or pelvic lymph nodes. Reproductive: Status post hysterectomy. No adnexal masses. Other: Negative for pelvic effusion or free air Musculoskeletal: No acute or suspicious osseous abnormality IMPRESSION: 1. No CT evidence for acute intra-abdominal or pelvic abnormality. 2. Scattered diverticular disease of the colon without acute inflammatory process. 3. Distended urinary bladder. 4. Aortic atherosclerosis. Aortic Atherosclerosis (ICD10-I70.0). Electronically Signed   By: Jasmine Pang M.D.   On: 01/21/2024 19:54   CT ABDOMEN PELVIS W CONTRAST Result Date: 01/11/2024 CLINICAL DATA:  Abdominal pain and hematuria. Pyelonephritis suspected. EXAM: CT ABDOMEN AND PELVIS WITH CONTRAST TECHNIQUE: Multidetector CT imaging of the abdomen and pelvis was performed using the standard protocol following bolus administration of intravenous contrast. RADIATION DOSE REDUCTION: This exam was performed according to the departmental dose-optimization program which includes automated exposure control, adjustment of the mA and/or kV according to patient size and/or use of iterative reconstruction technique. CONTRAST:  OMNIPAQUE IOHEXOL 300 MG/ML  SOLN COMPARISON:  01/10/2024 FINDINGS: Lower chest: Clear lung bases. Normal  heart size without pericardial or pleural effusion. Hepatobiliary: Focal steatosis adjacent the falciform ligament. Hepatomegaly versus a Riedel's lobe at 20.5 cm. Normal gallbladder, without biliary ductal dilatation. Pancreas: Normal, without mass or ductal dilatation. Spleen: Normal in size, without focal abnormality. Adrenals/Urinary Tract: Normal adrenal glands. Normal kidneys, without hydronephrosis. There is marked bladder wall thickening. Circumferential gas about the periphery of the bladder lumen is favored to be mucosal. There is small volume intraluminal gas as well. Example 65/2. Stomach/Bowel: Gastric antral underdistention. Apparent wall thickening is likely secondary. Normal colon, appendix, and terminal ileum. Normal small bowel. Vascular/Lymphatic: Aortic atherosclerosis. No abdominopelvic adenopathy. Reproductive: Hysterectomy.  No adnexal mass. Other: No significant free fluid. No extracystic gas. No free intraperitoneal air. Small fat containing ventral abdominal wall hernia. Musculoskeletal: Transitional S1 vertebral body. Degenerative disc disease at L4-5. IMPRESSION: 1. Marked bladder wall thickening with intraluminal and intramural gas, most consistent with emphysematous cystitis. 2.  Aortic Atherosclerosis (ICD10-I70.0). Electronically Signed   By: Jeronimo Greaves M.D.   On: 01/11/2024 15:50   CT ABDOMEN  PELVIS WO CONTRAST Result Date: 01/10/2024 CLINICAL DATA:  Three day history of abdominal pain and nausea EXAM: CT ABDOMEN AND PELVIS WITHOUT CONTRAST TECHNIQUE: Multidetector CT imaging of the abdomen and pelvis was performed following the standard protocol without IV contrast. RADIATION DOSE REDUCTION: This exam was performed according to the departmental dose-optimization program which includes automated exposure control, adjustment of the mA and/or kV according to patient size and/or use of iterative reconstruction technique. COMPARISON:  CT abdomen and pelvis dated 11/11/2023 FINDINGS:  Lower chest: No focal consolidation or pulmonary nodule in the lung bases. No pleural effusion or pneumothorax demonstrated. Partially imaged heart size is normal. Hepatobiliary: No focal hepatic lesions. No intra or extrahepatic biliary ductal dilation. Normal gallbladder. Pancreas: No focal lesions or main ductal dilation. Spleen: Normal in size without focal abnormality. Adrenals/Urinary Tract: No adrenal nodules. No suspicious renal mass on this noncontrast enhanced examination, calculi or hydronephrosis. Moderately distended urinary bladder demonstrates mild circumferential mural thickening. Stomach/Bowel: Mild mural thickening of the partially imaged distal esophagus. Normal appearance of the stomach. No evidence of bowel wall thickening, distention, or inflammatory changes. Colonic diverticulosis without acute diverticulitis. Normal appendix. Vascular/Lymphatic: Aortic atherosclerosis. No enlarged abdominal or pelvic lymph nodes. Reproductive: No adnexal masses. Other: No free fluid, fluid collection, or free air. Musculoskeletal: No acute or abnormal lytic or blastic osseous lesions. Small fat-containing paraumbilical hernia. IMPRESSION: 1. Mild circumferential mural thickening of the partially imaged distal esophagus, which can be seen in the setting of esophagitis. 2. Moderately distended urinary bladder demonstrates mild circumferential mural thickening, which may be due to cystitis. Recommend correlation with urinalysis and urinary retention. 3. Colonic diverticulosis without acute diverticulitis. 4.  Aortic Atherosclerosis (ICD10-I70.0). Electronically Signed   By: Agustin Cree M.D.   On: 01/10/2024 10:26   DG ABD ACUTE 2+V W 1V CHEST Result Date: 01/10/2024 CLINICAL DATA:  One-week history of constipation and nausea EXAM: DG ABDOMEN ACUTE WITH 1 VIEW CHEST COMPARISON:  CT abdomen and pelvis dated 11/11/2023 FINDINGS: Lines/tubes: None. Chest: Lungs are clear without focal consolidation. No pneumothorax  or pleural effusion. Normal heart size. Abdomen: Nonobstructive bowel gas pattern. No pneumatosis or free air. Moderate volume stool within the colon. No abnormal calcification. Pelvic soft tissue density, likely corresponding to distended urinary bladder. Bones: No acute osseous abnormality. IMPRESSION: 1. Nonobstructive bowel gas pattern. Moderate volume stool within the colon. 2. Pelvic soft tissue density, likely corresponding to distended urinary bladder. Recommend correlation with urinary retention. Electronically Signed   By: Agustin Cree M.D.   On: 01/10/2024 09:58     Subjective: Patient seen examined at bedside, resting on a.  Tolerating advance diet.  Ready for discharge home.  Denies headache, no dizziness, no chest pain, no palpitations, no shortness of breath, no abdominal pain, no fever/chills/night sweats, no nausea/vomiting/diarrhea, no focal weakness, no fatigue, no paresthesia.  No acute events overnight per nursing staff.  Discharge Exam: Vitals:   01/26/24 0431 01/26/24 1200  BP: (!) 95/58 (!) 97/52  Pulse: 77 70  Resp: 16 17  Temp: 98.3 F (36.8 C) 98.2 F (36.8 C)  SpO2: 97% 99%   Vitals:   01/25/24 1242 01/25/24 2009 01/26/24 0431 01/26/24 1200  BP: 102/76 102/75 (!) 95/58 (!) 97/52  Pulse: 79 76 77 70  Resp: 13 16 16 17   Temp: 98 F (36.7 C) 98.4 F (36.9 C) 98.3 F (36.8 C) 98.2 F (36.8 C)  TempSrc:      SpO2: 98% 99% 97% 99%  Weight:  Height:        Physical Exam: GEN: NAD, alert and oriented x 3, chronically ill in appearance HEENT: NCAT, PERRL, EOMI, sclera clear, MMM PULM: CTAB w/o wheezes/crackles, normal respiratory effort, on room air CV: RRR w/o M/G/R GI: abd soft, NTND, + BS MSK: no peripheral edema, muscle strength globally intact 5/5 bilateral upper/lower extremities NEURO: CN II-XII intact, no focal deficits, sensation to light touch intact PSYCH: normal mood/affect Integumentary: dry/intact, no rashes or wounds    The results of  significant diagnostics from this hospitalization (including imaging, microbiology, ancillary and laboratory) are listed below for reference.     Microbiology: No results found for this or any previous visit (from the past 240 hours).   Labs: BNP (last 3 results) No results for input(s): "BNP" in the last 8760 hours. Basic Metabolic Panel: Recent Labs  Lab 01/21/24 1357 01/22/24 0026 01/22/24 0506 01/23/24 0555 01/25/24 0828 01/26/24 0620  NA 136  --  131* 136 132* 135  K 3.7  --  3.6 3.6 4.5 4.2  CL 97*  --  93* 101 98 103  CO2 27  --  26 25 24 24   GLUCOSE 286*  --  284* 186* 135* 102*  BUN 14  --  16 15 9 9   CREATININE 0.76 0.75 0.71 0.59 0.84 0.89  CALCIUM 9.2  --  8.9 8.6* 9.0 8.9   Liver Function Tests: Recent Labs  Lab 01/21/24 1357 01/22/24 0506 01/23/24 0555 01/25/24 0828  AST 16 19 13* 16  ALT 15 15 15 14   ALKPHOS 60 60 43 46  BILITOT 0.6 0.7 0.6 0.8  PROT 7.2 7.4 5.6* 6.7  ALBUMIN 4.0 3.9 3.2* 3.9   Recent Labs  Lab 01/21/24 1357 01/22/24 0506 01/25/24 0828 01/26/24 0620  LIPASE 125* 41 21 21   No results for input(s): "AMMONIA" in the last 168 hours. CBC: Recent Labs  Lab 01/21/24 1357 01/22/24 0026 01/22/24 0506 01/23/24 0555 01/25/24 0828  WBC 9.4 8.8 12.1* 8.0 7.7  HGB 13.8 12.2 13.5 11.6* 12.4  HCT 42.7 37.6 42.8 37.4 38.3  MCV 85.2 86.2 86.8 89.9 87.4  PLT 384 371 434* 307 327   Cardiac Enzymes: No results for input(s): "CKTOTAL", "CKMB", "CKMBINDEX", "TROPONINI" in the last 168 hours. BNP: Invalid input(s): "POCBNP" CBG: Recent Labs  Lab 01/25/24 1130 01/25/24 1643 01/25/24 2038 01/26/24 0716 01/26/24 1138  GLUCAP 104* 97 134* 105* 284*   D-Dimer No results for input(s): "DDIMER" in the last 72 hours. Hgb A1c No results for input(s): "HGBA1C" in the last 72 hours. Lipid Profile No results for input(s): "CHOL", "HDL", "LDLCALC", "TRIG", "CHOLHDL", "LDLDIRECT" in the last 72 hours. Thyroid function studies No results  for input(s): "TSH", "T4TOTAL", "T3FREE", "THYROIDAB" in the last 72 hours.  Invalid input(s): "FREET3" Anemia work up No results for input(s): "VITAMINB12", "FOLATE", "FERRITIN", "TIBC", "IRON", "RETICCTPCT" in the last 72 hours. Urinalysis    Component Value Date/Time   COLORURINE STRAW (A) 01/21/2024 2011   APPEARANCEUR CLEAR 01/21/2024 2011   LABSPEC 1.020 01/21/2024 2011   PHURINE 8.0 01/21/2024 2011   GLUCOSEU >=500 (A) 01/21/2024 2011   HGBUR NEGATIVE 01/21/2024 2011   BILIRUBINUR NEGATIVE 01/21/2024 2011   BILIRUBINUR negative 03/09/2021 1123   KETONESUR 5 (A) 01/21/2024 2011   PROTEINUR NEGATIVE 01/21/2024 2011   UROBILINOGEN 0.2 03/09/2021 1123   UROBILINOGEN 0.2 10/28/2013 1723   NITRITE NEGATIVE 01/21/2024 2011   LEUKOCYTESUR NEGATIVE 01/21/2024 2011   Sepsis Labs Recent Labs  Lab 01/22/24 0026  01/22/24 0506 01/23/24 0555 01/25/24 0828  WBC 8.8 12.1* 8.0 7.7   Microbiology No results found for this or any previous visit (from the past 240 hours).   Time coordinating discharge: Over 30 minutes  SIGNED:   Alvira Philips [place], DO  [facility] 01/26/2024, 1:09 PM

## 2024-01-27 LAB — IGG 1, 2, 3, AND 4
IgG (Immunoglobin G), Serum: 624 mg/dL (ref 586–1602)
IgG, Subclass 1: 330 mg/dL (ref 248–810)
IgG, Subclass 2: 118 mg/dL — ABNORMAL LOW (ref 130–555)
IgG, Subclass 3: 10 mg/dL — ABNORMAL LOW (ref 15–102)
IgG, Subclass 4: 30 mg/dL (ref 2–96)

## 2024-02-04 ENCOUNTER — Emergency Department (HOSPITAL_COMMUNITY)
Admission: EM | Admit: 2024-02-04 | Discharge: 2024-02-04 | Discharge status: Against Medical Advice | Payer: BC Managed Care – PPO | Attending: Emergency Medicine | Admitting: Emergency Medicine

## 2024-02-04 DIAGNOSIS — Z5321 Procedure and treatment not carried out due to patient leaving prior to being seen by health care provider: Secondary | ICD-10-CM | POA: Diagnosis not present

## 2024-02-04 DIAGNOSIS — R739 Hyperglycemia, unspecified: Secondary | ICD-10-CM | POA: Insufficient documentation

## 2024-02-04 NOTE — ED Notes (Signed)
 Pt was outside. Pt no longer outside on bench

## 2024-02-06 ENCOUNTER — Emergency Department (HOSPITAL_COMMUNITY)
Admission: EM | Admit: 2024-02-06 | Discharge: 2024-02-06 | Disposition: A | Discharge status: Home / Self Care | Payer: BC Managed Care – PPO | Attending: Emergency Medicine | Admitting: Emergency Medicine

## 2024-02-06 DIAGNOSIS — I509 Heart failure, unspecified: Secondary | ICD-10-CM | POA: Insufficient documentation

## 2024-02-06 DIAGNOSIS — E1065 Type 1 diabetes mellitus with hyperglycemia: Secondary | ICD-10-CM | POA: Insufficient documentation

## 2024-02-06 DIAGNOSIS — K859 Acute pancreatitis without necrosis or infection, unspecified: Secondary | ICD-10-CM | POA: Diagnosis not present

## 2024-02-06 DIAGNOSIS — N182 Chronic kidney disease, stage 2 (mild): Secondary | ICD-10-CM | POA: Diagnosis not present

## 2024-02-06 DIAGNOSIS — R1030 Lower abdominal pain, unspecified: Secondary | ICD-10-CM | POA: Diagnosis present

## 2024-02-06 DIAGNOSIS — Z79899 Other long term (current) drug therapy: Secondary | ICD-10-CM | POA: Insufficient documentation

## 2024-02-06 DIAGNOSIS — R739 Hyperglycemia, unspecified: Secondary | ICD-10-CM

## 2024-02-06 DIAGNOSIS — Z794 Long term (current) use of insulin: Secondary | ICD-10-CM | POA: Diagnosis not present

## 2024-02-06 DIAGNOSIS — E1010 Type 1 diabetes mellitus with ketoacidosis without coma: Secondary | ICD-10-CM | POA: Diagnosis not present

## 2024-02-06 DIAGNOSIS — E039 Hypothyroidism, unspecified: Secondary | ICD-10-CM | POA: Diagnosis not present

## 2024-02-06 DIAGNOSIS — I130 Hypertensive heart and chronic kidney disease with heart failure and stage 1 through stage 4 chronic kidney disease, or unspecified chronic kidney disease: Secondary | ICD-10-CM | POA: Insufficient documentation

## 2024-02-06 DIAGNOSIS — N3000 Acute cystitis without hematuria: Secondary | ICD-10-CM | POA: Diagnosis not present

## 2024-02-06 DIAGNOSIS — E1040 Type 1 diabetes mellitus with diabetic neuropathy, unspecified: Secondary | ICD-10-CM | POA: Insufficient documentation

## 2024-02-06 DIAGNOSIS — Z7982 Long term (current) use of aspirin: Secondary | ICD-10-CM | POA: Diagnosis not present

## 2024-02-06 LAB — URINALYSIS, ROUTINE W REFLEX MICROSCOPIC
Bilirubin Urine: NEGATIVE
Glucose, UA: >=500 mg/dL — AB
Hgb urine dipstick: NEGATIVE
Ketones, ur: 5 mg/dL — AB
Leukocytes,Ua: NEGATIVE
Nitrite: NEGATIVE
Protein, ur: 30 mg/dL — AB
Specific Gravity, Urine: 1.014 (ref 1.005–1.030)
pH: 6 (ref 5.0–8.0)

## 2024-02-06 LAB — COMPREHENSIVE METABOLIC PANEL
ALT: 14 U/L (ref 0–44)
AST: 17 U/L (ref 15–41)
Albumin: 4.2 g/dL (ref 3.5–5.0)
Alkaline Phosphatase: 65 U/L (ref 38–126)
Anion gap: 13 (ref 5–15)
BUN: 32 mg/dL — ABNORMAL HIGH (ref 6–20)
CO2: 29 mmol/L (ref 22–32)
Calcium: 9 mg/dL (ref 8.9–10.3)
Chloride: 87 mmol/L — ABNORMAL LOW (ref 98–111)
Creatinine, Ser: 0.91 mg/dL (ref 0.44–1.00)
GFR, Estimated: >60 mL/min (ref >60)
Glucose, Bld: 309 mg/dL — ABNORMAL HIGH (ref 70–99)
Potassium: 3.4 mmol/L — ABNORMAL LOW (ref 3.5–5.1)
Sodium: 129 mmol/L — ABNORMAL LOW (ref 135–145)
Total Bilirubin: 0.9 mg/dL (ref 0.0–1.2)
Total Protein: 7.7 g/dL (ref 6.5–8.1)

## 2024-02-06 LAB — CBC
HCT: 44.5 % (ref 36.0–46.0)
Hemoglobin: 14.8 g/dL (ref 12.0–15.0)
MCH: 28 pg (ref 26.0–34.0)
MCHC: 33.3 g/dL (ref 30.0–36.0)
MCV: 84.1 fL (ref 80.0–100.0)
Platelets: 452 10*3/uL — ABNORMAL HIGH (ref 150–400)
RBC: 5.29 MIL/uL — ABNORMAL HIGH (ref 3.87–5.11)
RDW: 15.7 % — ABNORMAL HIGH (ref 11.5–15.5)
WBC: 10.1 10*3/uL (ref 4.0–10.5)
nRBC: 0 % (ref 0.0–0.2)

## 2024-02-06 LAB — LIPASE, BLOOD: Lipase: 103 U/L — ABNORMAL HIGH (ref 11–51)

## 2024-02-06 MED ORDER — INSULIN ASPART 100 UNIT/ML IJ SOLN
5.0000 [IU] | INTRAMUSCULAR | Status: AC
  Administered 2024-02-06: 5 [IU] via SUBCUTANEOUS
  Filled 2024-02-06: qty 0.05

## 2024-02-06 MED ORDER — PHENAZOPYRIDINE HCL 200 MG PO TABS: 200.0000 mg | ORAL_TABLET | Freq: Three times a day (TID) | ORAL | 0 refills | Status: DC

## 2024-02-06 MED ORDER — TRAMADOL HCL 50 MG PO TABS
50.0000 mg | ORAL_TABLET | Freq: Once | ORAL | Status: AC
  Administered 2024-02-06: 50 mg via ORAL
  Filled 2024-02-06: qty 1

## 2024-02-06 MED ORDER — CEPHALEXIN 500 MG PO CAPS: 500.0000 mg | ORAL_CAPSULE | Freq: Four times a day (QID) | ORAL | 0 refills | Status: DC

## 2024-02-06 MED ORDER — KETOROLAC TROMETHAMINE 30 MG/ML IJ SOLN
30.0000 mg | Freq: Once | INTRAMUSCULAR | Status: AC
Start: 2024-02-06 — End: 2024-02-06
  Administered 2024-02-06: 30 mg via INTRAVENOUS
  Filled 2024-02-06: qty 1

## 2024-02-06 MED ORDER — TRAMADOL HCL 50 MG PO TABS: 50.0000 mg | ORAL_TABLET | Freq: Four times a day (QID) | ORAL | 0 refills | Status: DC | PRN

## 2024-02-06 MED ORDER — DROPERIDOL 2.5 MG/ML IJ SOLN
1.2500 mg | Freq: Once | INTRAMUSCULAR | Status: AC
  Administered 2024-02-06: 1.25 mg via INTRAVENOUS
  Filled 2024-02-06: qty 2

## 2024-02-06 MED ORDER — CEPHALEXIN 500 MG PO CAPS
500.0000 mg | ORAL_CAPSULE | Freq: Once | ORAL | Status: AC
Start: 2024-02-06 — End: 2024-02-06
  Administered 2024-02-06: 500 mg via ORAL
  Filled 2024-02-06: qty 1

## 2024-02-06 MED ORDER — ONDANSETRON 8 MG PO TBDP: ORAL_TABLET | ORAL | 0 refills | Status: DC

## 2024-02-06 NOTE — ED Triage Notes (Signed)
 Patient arrived with complaints of abdominal pain and vomiting over the last two day, recently diagnosed with pancreatitis.

## 2024-02-06 NOTE — ED Provider Notes (Signed)
 [place] [facility] AT [facility] Provider Note   CSN: [id number] Arrival date & time: 02/06/24  5366     History  Chief Complaint  Patient presents with   Abdominal Pain    [name] [name] is a 63 y.o. female.  The history is provided by the patient.  Abdominal Pain Pain location:  Suprapubic Pain quality: aching   Pain radiates to:  Does not radiate Pain severity:  Moderate Onset quality:  Gradual Duration:  2 days Timing:  Constant Progression:  Waxing and waning Chronicity:  New Relieved by:  Nothing Worsened by:  Nothing Ineffective treatments:  None tried Associated symptoms: dysuria, nausea and vomiting   Associated symptoms: no fever   Risk factors: multiple surgeries   Risk factors: no NSAID use   Patient with IBS and DM that is uncontrolled presents with dysuria, suprapubic pain and nausea and vomiting.      Past Medical History:  Diagnosis Date   Allergy    seasonal   Anxiety    B12 deficiency    Chest pain 04/05/2017   CHF (congestive heart failure) ([name])    Diabetes mellitus type 1, uncontrolled    "dr. 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 Authorizing Provider  cephALEXin (KEFLEX) 500 MG capsule Take 1 capsule (500 mg total) by mouth 4 (four) times daily. 02/06/24  Yes Lennard Capek, MD  phenazopyridine (PYRIDIUM) 200 MG tablet Take 1 tablet (200 mg total) by mouth 3  (three) times daily. 02/06/24  Yes Talayeh Bruinsma, MD  aspirin EC 81 MG tablet Take 81 mg by mouth daily. Swallow whole.    [provider]  azelastine (ASTELIN) 0.1 % nasal spray Place 1 spray into both nostrils as needed for rhinitis or allergies. 11/04/23   [provider]  Continuous Blood Gluc Transmit (DEXCOM G6 TRANSMITTER) MISC USE AS DIRECTED FOR CONTINUOUS GLUCOSE MONITORING. REUSE TRANSMITTER X 90DAYS THEN DISCARD & REPLACE 07/05/21   Bedsole, Amy E, MD  escitalopram (LEXAPRO) 10 MG tablet Take 1 tablet (10 mg total) by mouth daily. 11/18/23   Rodolph Bong, MD  Glucagon, rDNA, (GLUCAGON EMERGENCY) 1 MG KIT Inject 1 mg into the skin See admin instructions. Inject one mg into the skin see administration instructions. Follow package directions for low blood sugar. 06/18/23   [provider]  levothyroxine (SYNTHROID) 137 MCG tablet Take 137 mcg by mouth daily before breakfast. 03/19/22   [provider]  metoCLOPramide (REGLAN) 5 MG tablet Take 1 tablet (5 mg total) by mouth 3 (three) times daily before meals. 11/12/23   Danford, Earl Lites, MD  NOVOLOG FLEXPEN 100 UNIT/ML FlexPen Inject 10 Units into the skin 3 (three) times daily with meals. Plus sliding scale TID 12/08/23   Azucena Fallen, MD  omega-3 acid ethyl esters (LOVAZA) 1 g capsule Take 2 capsules (2 g total) by  mouth 2 (two) times daily. 09/13/23   Alwyn Ren, MD  ondansetron (ZOFRAN) 4 MG tablet Take 2 tablets (8 mg total) by mouth every 8 (eight) hours as needed for nausea or vomiting. 12/25/23   Mansouraty, Netty Starring., MD  ondansetron (ZOFRAN-ODT) 8 MG disintegrating tablet Take 1 tablet (8 mg total) by mouth every 8 (eight) hours as needed for nausea or vomiting. 01/10/24   Lorre Nick, MD  pantoprazole (PROTONIX) 40 MG tablet Take 1 tablet (40 mg total) by mouth 2 (two) times daily. Take 30 minutes before breakfast and dinner 01/16/24   Arnaldo Natal, NP  QUEtiapine  (SEROQUEL) 50 MG tablet Take 50 mg by mouth at bedtime. 09/09/23   [provider]  rosuvastatin (CRESTOR) 10 MG tablet Take 1 tablet (10 mg total) by mouth daily. 11/18/23   Rodolph Bong, MD  sucralfate (CARAFATE) 1 g tablet Take 1 tablet (1 g total) by mouth 2 (two) times daily. Do not take any other medication within 2-4 hours. 12/25/23   Mansouraty, Netty Starring., MD  tamsulosin (FLOMAX) 0.4 MG CAPS capsule Take 1 capsule (0.4 mg total) by mouth daily after supper. 01/06/24   Danford, Earl Lites, MD  TOUJEO SOLOSTAR 300 UNIT/ML Solostar Pen Inject 25 Units into the skin at bedtime. 12/08/23   Azucena Fallen, MD  traMADol (ULTRAM) 50 MG tablet 1 to 2 tablets every 6 hours with extra strength Tylenol for pain as needed Patient taking differently: Take 50 mg by mouth as needed for moderate pain (pain score 4-6) or severe pain (pain score 7-10). 12/12/23   Arby Barrette, MD  traZODone (DESYREL) 50 MG tablet Take 50 mg by mouth at bedtime as needed for sleep.    [provider]      Allergies    Codeine    Review of Systems   Review of Systems  Constitutional:  Negative for fever.  HENT:  Negative for facial swelling.   Eyes:  Negative for redness.  Respiratory:  Negative for wheezing and stridor.   Gastrointestinal:  Positive for abdominal pain, nausea and vomiting.  Genitourinary:  Positive for dysuria.  All other systems reviewed and are negative.   Physical Exam Updated Vital Signs BP 133/79 (BP Location: Right Arm)   Pulse 92   Temp 98.1 F (36.7 C) (Oral)   Resp 18   LMP 08/11/2012   SpO2 98%  Physical Exam Vitals and nursing note reviewed.  Constitutional:      General: She is not in acute distress.    Appearance: Normal appearance. She is well-developed.  HENT:     Head: Normocephalic and atraumatic.     Nose: Nose normal.  Eyes:     Pupils: Pupils are equal, round, and reactive to light.  Cardiovascular:     Rate and Rhythm: Normal rate  and regular rhythm.     Pulses: Normal pulses.     Heart sounds: Normal heart sounds.  Pulmonary:     Effort: Pulmonary effort is normal. No respiratory distress.     Breath sounds: Normal breath sounds.  Abdominal:     General: Bowel sounds are normal. There is no distension.     Palpations: Abdomen is soft.     Tenderness: There is no abdominal tenderness. There is no guarding or rebound.  Musculoskeletal:        General: Normal range of motion.     Cervical back: Neck supple.  Skin:    General: Skin is warm and  dry.     Capillary Refill: Capillary refill takes less than 2 seconds.     Findings: No erythema or rash.  Neurological:     General: No focal deficit present.     Mental Status: She is alert and oriented to person, place, and time.     Deep Tendon Reflexes: Reflexes normal.  Psychiatric:        Mood and Affect: Mood normal.     ED Results / Procedures / Treatments   Labs (all labs ordered are listed, but only abnormal results are displayed) Results for orders placed or performed during the hospital encounter of 02/06/24  Lipase, blood   Collection Time: 02/06/24  4:59 AM  Result Value Ref Range   Lipase 103 (H) 11 - 51 U/L  Comprehensive metabolic panel   Collection Time: 02/06/24  4:59 AM  Result Value Ref Range   Sodium 129 (L) 135 - 145 mmol/L   Potassium 3.4 (L) 3.5 - 5.1 mmol/L   Chloride 87 (L) 98 - 111 mmol/L   CO2 29 22 - 32 mmol/L   Glucose, Bld 309 (H) 70 - 99 mg/dL   BUN 32 (H) 6 - 20 mg/dL   Creatinine, Ser 1.02 0.44 - 1.00 mg/dL   Calcium 9.0 8.9 - 72.5 mg/dL   Total Protein 7.7 6.5 - 8.1 g/dL   Albumin 4.2 3.5 - 5.0 g/dL   AST 17 15 - 41 U/L   ALT 14 0 - 44 U/L   Alkaline Phosphatase 65 38 - 126 U/L   Total Bilirubin 0.9 0.0 - 1.2 mg/dL   GFR, Estimated >36 >64 mL/min   Anion gap 13 5 - 15  CBC   Collection Time: 02/06/24  4:59 AM  Result Value Ref Range   WBC 10.1 4.0 - 10.5 K/uL   RBC 5.29 (H) 3.87 - 5.11 MIL/uL   Hemoglobin 14.8  12.0 - 15.0 g/dL   HCT 40.3 47.4 - 25.9 %   MCV 84.1 80.0 - 100.0 fL   MCH 28.0 26.0 - 34.0 pg   MCHC 33.3 30.0 - 36.0 g/dL   RDW 56.3 (H) 87.5 - 64.3 %   Platelets 452 (H) 150 - 400 K/uL   nRBC 0.0 0.0 - 0.2 %  Urinalysis, Routine w reflex microscopic -Urine, Clean Catch   Collection Time: 02/06/24  5:05 AM  Result Value Ref Range   Color, Urine YELLOW YELLOW   APPearance HAZY (A) CLEAR   Specific Gravity, Urine 1.014 1.005 - 1.030   pH 6.0 5.0 - 8.0   Glucose, UA >=500 (A) NEGATIVE mg/dL   Hgb urine dipstick NEGATIVE NEGATIVE   Bilirubin Urine NEGATIVE NEGATIVE   Ketones, ur 5 (A) NEGATIVE mg/dL   Protein, ur 30 (A) NEGATIVE mg/dL   Nitrite NEGATIVE NEGATIVE   Leukocytes,Ua NEGATIVE NEGATIVE   RBC / HPF 0-5 0 - 5 RBC/hpf   WBC, UA 0-5 0 - 5 WBC/hpf   Bacteria, UA RARE (A) NONE SEEN   Squamous Epithelial / HPF 11-20 0 - 5 /HPF   Mucus PRESENT    Hyaline Casts, UA PRESENT    CT ABDOMEN PELVIS W CONTRAST Result Date: 01/21/2024 CLINICAL DATA:  Abdomen pain nausea EXAM: CT ABDOMEN AND PELVIS WITH CONTRAST TECHNIQUE: Multidetector CT imaging of the abdomen and pelvis was performed using the standard protocol following bolus administration of intravenous contrast. RADIATION DOSE REDUCTION: This exam was performed according to the departmental dose-optimization program which includes automated exposure control, adjustment of the mA  and/or kV according to patient size and/or use of iterative reconstruction technique. CONTRAST:  OMNIPAQUE IOHEXOL 300 MG/ML  SOLN COMPARISON:  CT 01/11/2024, 01/10/2024 FINDINGS: Lower chest: Lung bases are clear Hepatobiliary: No focal liver abnormality is seen. No gallstones, gallbladder wall thickening, or biliary dilatation. Pancreas: Unremarkable. No pancreatic ductal dilatation or surrounding inflammatory changes. Spleen: Normal in size without focal abnormality. Adrenals/Urinary Tract: Adrenal glands are normal. Kidneys show no hydronephrosis.  Distended urinary bladder Stomach/Bowel: Stomach is within normal limits. Scattered diverticular disease of the colon. No evidence of bowel wall thickening, distention, or inflammatory changes. Vascular/Lymphatic: Aortic atherosclerosis. No enlarged abdominal or pelvic lymph nodes. Reproductive: Status post hysterectomy. No adnexal masses. Other: Negative for pelvic effusion or free air Musculoskeletal: No acute or suspicious osseous abnormality IMPRESSION: 1. No CT evidence for acute intra-abdominal or pelvic abnormality. 2. Scattered diverticular disease of the colon without acute inflammatory process. 3. Distended urinary bladder. 4. Aortic atherosclerosis. Aortic Atherosclerosis (ICD10-I70.0). Electronically Signed   By: Jasmine Pang M.D.   On: 01/21/2024 19:54   CT ABDOMEN PELVIS W CONTRAST Result Date: 01/11/2024 CLINICAL DATA:  Abdominal pain and hematuria. Pyelonephritis suspected. EXAM: CT ABDOMEN AND PELVIS WITH CONTRAST TECHNIQUE: Multidetector CT imaging of the abdomen and pelvis was performed using the standard protocol following bolus administration of intravenous contrast. RADIATION DOSE REDUCTION: This exam was performed according to the departmental dose-optimization program which includes automated exposure control, adjustment of the mA and/or kV according to patient size and/or use of iterative reconstruction technique. CONTRAST:  OMNIPAQUE IOHEXOL 300 MG/ML  SOLN COMPARISON:  01/10/2024 FINDINGS: Lower chest: Clear lung bases. Normal heart size without pericardial or pleural effusion. Hepatobiliary: Focal steatosis adjacent the falciform ligament. Hepatomegaly versus a Riedel's lobe at 20.5 cm. Normal gallbladder, without biliary ductal dilatation. Pancreas: Normal, without mass or ductal dilatation. Spleen: Normal in size, without focal abnormality. Adrenals/Urinary Tract: Normal adrenal glands. Normal kidneys, without hydronephrosis. There is marked bladder wall thickening.  Circumferential gas about the periphery of the bladder lumen is favored to be mucosal. There is small volume intraluminal gas as well. Example 65/2. Stomach/Bowel: Gastric antral underdistention. Apparent wall thickening is likely secondary. Normal colon, appendix, and terminal ileum. Normal small bowel. Vascular/Lymphatic: Aortic atherosclerosis. No abdominopelvic adenopathy. Reproductive: Hysterectomy.  No adnexal mass. Other: No significant free fluid. No extracystic gas. No free intraperitoneal air. Small fat containing ventral abdominal wall hernia. Musculoskeletal: Transitional S1 vertebral body. Degenerative disc disease at L4-5. IMPRESSION: 1. Marked bladder wall thickening with intraluminal and intramural gas, most consistent with emphysematous cystitis. 2.  Aortic Atherosclerosis (ICD10-I70.0). Electronically Signed   By: Jeronimo Greaves M.D.   On: 01/11/2024 15:50   CT ABDOMEN PELVIS WO CONTRAST Result Date: 01/10/2024 CLINICAL DATA:  Three day history of abdominal pain and nausea EXAM: CT ABDOMEN AND PELVIS WITHOUT CONTRAST TECHNIQUE: Multidetector CT imaging of the abdomen and pelvis was performed following the standard protocol without IV contrast. RADIATION DOSE REDUCTION: This exam was performed according to the departmental dose-optimization program which includes automated exposure control, adjustment of the mA and/or kV according to patient size and/or use of iterative reconstruction technique. COMPARISON:  CT abdomen and pelvis dated 11/11/2023 FINDINGS: Lower chest: No focal consolidation or pulmonary nodule in the lung bases. No pleural effusion or pneumothorax demonstrated. Partially imaged heart size is normal. Hepatobiliary: No focal hepatic lesions. No intra or extrahepatic biliary ductal dilation. Normal gallbladder. Pancreas: No focal lesions or main ductal dilation. Spleen: Normal in size without focal abnormality. Adrenals/Urinary Tract:  No adrenal nodules. No suspicious renal mass on  this noncontrast enhanced examination, calculi or hydronephrosis. Moderately distended urinary bladder demonstrates mild circumferential mural thickening. Stomach/Bowel: Mild mural thickening of the partially imaged distal esophagus. Normal appearance of the stomach. No evidence of bowel wall thickening, distention, or inflammatory changes. Colonic diverticulosis without acute diverticulitis. Normal appendix. Vascular/Lymphatic: Aortic atherosclerosis. No enlarged abdominal or pelvic lymph nodes. Reproductive: No adnexal masses. Other: No free fluid, fluid collection, or free air. Musculoskeletal: No acute or abnormal lytic or blastic osseous lesions. Small fat-containing paraumbilical hernia. IMPRESSION: 1. Mild circumferential mural thickening of the partially imaged distal esophagus, which can be seen in the setting of esophagitis. 2. Moderately distended urinary bladder demonstrates mild circumferential mural thickening, which may be due to cystitis. Recommend correlation with urinalysis and urinary retention. 3. Colonic diverticulosis without acute diverticulitis. 4.  Aortic Atherosclerosis (ICD10-I70.0). Electronically Signed   By: Agustin Cree M.D.   On: 01/10/2024 10:26   DG ABD ACUTE 2+V W 1V CHEST Result Date: 01/10/2024 CLINICAL DATA:  One-week history of constipation and nausea EXAM: DG ABDOMEN ACUTE WITH 1 VIEW CHEST COMPARISON:  CT abdomen and pelvis dated 11/11/2023 FINDINGS: Lines/tubes: None. Chest: Lungs are clear without focal consolidation. No pneumothorax or pleural effusion. Normal heart size. Abdomen: Nonobstructive bowel gas pattern. No pneumatosis or free air. Moderate volume stool within the colon. No abnormal calcification. Pelvic soft tissue density, likely corresponding to distended urinary bladder. Bones: No acute osseous abnormality. IMPRESSION: 1. Nonobstructive bowel gas pattern. Moderate volume stool within the colon. 2. Pelvic soft tissue density, likely corresponding to distended  urinary bladder. Recommend correlation with urinary retention. Electronically Signed   By: Agustin Cree M.D.   On: 01/10/2024 09:58    Radiology No results found.  Procedures Procedures    Medications Ordered in ED Medications  traMADol (ULTRAM) tablet 50 mg (has no administration in time range)  ketorolac (TORADOL) 30 MG/ML injection 30 mg (30 mg Intravenous Given 02/06/24 0454)  droperidol (INAPSINE) 2.5 MG/ML injection 1.25 mg (1.25 mg Intravenous Given 02/06/24 0507)  insulin aspart (novoLOG) injection 5 Units (5 Units Subcutaneous Given 02/06/24 0613)  cephALEXin (KEFLEX) capsule 500 mg (500 mg Oral Given 02/06/24 1610)    ED Course/ Medical Decision Making/ A&P                                 Medical Decision Making Patient with suprapubic pain and dysuria and emesis   Problems Addressed: Acute cystitis without hematuria:    Details: Will send home on  Acute pancreatitis, unspecified complication status, unspecified pancreatitis type:    Details: Very mild elevation of lipase 103.  No emesis in the ED will send home with PO pain medication and anti-emetics   Amount and/or Complexity of Data Reviewed Labs: ordered.    Details: Lipase is 103, normal white count 10.1, normal hemoglobin 14.8, normal platelets. Sodium 129, normal potassium, normal creatinine urine with UTI  Risk Prescription drug management. Risk Details: Sleeping soundly in the room post medication.  No emesis in the ED.  Will d/c on opioid pain medication and keflex for UTI.  Exam is benign and reassuring as are vital signs.  Stable for discharge.  Strict return precautions given for new or worsening symptoms.      Final Clinical Impression(s) / ED Diagnoses Final diagnoses:  Hyperglycemia  Acute cystitis without hematuria    I have reviewed the triage vital signs  and the nursing notes. Pertinent labs & imaging results that were available during my care of the patient were reviewed by me and considered in  my medical decision making (see chart for details). After history, exam, and medical workup I feel the patient has been appropriately medically screened and is safe for discharge home. Pertinent diagnoses were discussed with the patient. Patient was given return precautions.    Rx / DC Orders ED Discharge Orders          Ordered    cephALEXin (KEFLEX) 500 MG capsule  4 times daily        02/06/24 0624    phenazopyridine (PYRIDIUM) 200 MG tablet  3 times daily        02/06/24 0628              Vidya Bamford, MD 02/06/24 [phone]

## 2024-02-06 NOTE — ED Notes (Signed)
 Patient asleep, patient asked for meds only when waken up.

## 2024-02-08 ENCOUNTER — Emergency Department (HOSPITAL_COMMUNITY)

## 2024-02-08 ENCOUNTER — Encounter (HOSPITAL_COMMUNITY): Payer: Self-pay | Admitting: Emergency Medicine

## 2024-02-08 ENCOUNTER — Emergency Department (HOSPITAL_COMMUNITY)
Admission: EM | Admit: 2024-02-08 | Discharge: 2024-02-08 | Disposition: A | Discharge status: Home / Self Care | Attending: Emergency Medicine | Admitting: Emergency Medicine

## 2024-02-08 ENCOUNTER — Other Ambulatory Visit: Payer: Self-pay

## 2024-02-08 DIAGNOSIS — N3000 Acute cystitis without hematuria: Secondary | ICD-10-CM | POA: Diagnosis not present

## 2024-02-08 DIAGNOSIS — Z7982 Long term (current) use of aspirin: Secondary | ICD-10-CM | POA: Diagnosis not present

## 2024-02-08 DIAGNOSIS — R109 Unspecified abdominal pain: Secondary | ICD-10-CM | POA: Diagnosis present

## 2024-02-08 DIAGNOSIS — Z794 Long term (current) use of insulin: Secondary | ICD-10-CM | POA: Insufficient documentation

## 2024-02-08 DIAGNOSIS — R519 Headache, unspecified: Secondary | ICD-10-CM | POA: Insufficient documentation

## 2024-02-08 LAB — CBC WITH DIFFERENTIAL/PLATELET
Abs Immature Granulocytes: 0.04 10*3/uL (ref 0.00–0.07)
Basophils Absolute: 0.1 10*3/uL (ref 0.0–0.1)
Basophils Relative: 1 %
Eosinophils Absolute: 0.1 10*3/uL (ref 0.0–0.5)
Eosinophils Relative: 1 %
HCT: 43.6 % (ref 36.0–46.0)
Hemoglobin: 14 g/dL (ref 12.0–15.0)
Immature Granulocytes: 0 %
Lymphocytes Relative: 19 %
Lymphs Abs: 1.7 10*3/uL (ref 0.7–4.0)
MCH: 27.6 pg (ref 26.0–34.0)
MCHC: 32.1 g/dL (ref 30.0–36.0)
MCV: 86 fL (ref 80.0–100.0)
Monocytes Absolute: 0.5 10*3/uL (ref 0.1–1.0)
Monocytes Relative: 5 %
Neutro Abs: 6.8 10*3/uL (ref 1.7–7.7)
Neutrophils Relative %: 74 %
Platelets: 382 10*3/uL (ref 150–400)
RBC: 5.07 MIL/uL (ref 3.87–5.11)
RDW: 15.5 % (ref 11.5–15.5)
WBC: 9.3 10*3/uL (ref 4.0–10.5)
nRBC: 0 % (ref 0.0–0.2)

## 2024-02-08 LAB — URINALYSIS, W/ REFLEX TO CULTURE (INFECTION SUSPECTED)
Bilirubin Urine: NEGATIVE
Bilirubin Urine: NEGATIVE
Glucose, UA: >=500 mg/dL — AB
Glucose, UA: >=500 mg/dL — AB
Hgb urine dipstick: NEGATIVE
Hgb urine dipstick: NEGATIVE
Ketones, ur: 20 mg/dL — AB
Ketones, ur: 5 mg/dL — AB
Leukocytes,Ua: NEGATIVE
Leukocytes,Ua: NEGATIVE
Nitrite: POSITIVE — AB
Nitrite: POSITIVE — AB
Protein, ur: 30 mg/dL — AB
Protein, ur: NEGATIVE mg/dL
Specific Gravity, Urine: 1.021 (ref 1.005–1.030)
Specific Gravity, Urine: >1.046 — ABNORMAL HIGH (ref 1.005–1.030)
pH: 7 (ref 5.0–8.0)
pH: 7 (ref 5.0–8.0)

## 2024-02-08 LAB — COMPREHENSIVE METABOLIC PANEL
ALT: 11 U/L (ref 0–44)
AST: 14 U/L — ABNORMAL LOW (ref 15–41)
Albumin: 3.9 g/dL (ref 3.5–5.0)
Alkaline Phosphatase: 65 U/L (ref 38–126)
Anion gap: 12 (ref 5–15)
BUN: 30 mg/dL — ABNORMAL HIGH (ref 6–20)
CO2: 29 mmol/L (ref 22–32)
Calcium: 9.2 mg/dL (ref 8.9–10.3)
Chloride: 93 mmol/L — ABNORMAL LOW (ref 98–111)
Creatinine, Ser: 0.84 mg/dL (ref 0.44–1.00)
GFR, Estimated: >60 mL/min (ref >60)
Glucose, Bld: 339 mg/dL — ABNORMAL HIGH (ref 70–99)
Potassium: 3.9 mmol/L (ref 3.5–5.1)
Sodium: 134 mmol/L — ABNORMAL LOW (ref 135–145)
Total Bilirubin: 0.9 mg/dL (ref 0.0–1.2)
Total Protein: 6.8 g/dL (ref 6.5–8.1)

## 2024-02-08 LAB — CBG MONITORING, ED: Glucose-Capillary: 199 mg/dL — ABNORMAL HIGH (ref 70–99)

## 2024-02-08 MED ORDER — ONDANSETRON 8 MG PO TBDP
8.0000 mg | ORAL_TABLET | Freq: Once | ORAL | Status: AC
  Administered 2024-02-08: 8 mg via ORAL
  Filled 2024-02-08: qty 1

## 2024-02-08 MED ORDER — IOHEXOL 300 MG/ML  SOLN
80.0000 mL | Freq: Once | INTRAMUSCULAR | Status: AC | PRN
  Administered 2024-02-08: 80 mL via INTRAVENOUS

## 2024-02-08 MED ORDER — OXYCODONE HCL 5 MG PO TABS
5.0000 mg | ORAL_TABLET | Freq: Once | ORAL | Status: AC
  Administered 2024-02-08: 5 mg via ORAL
  Filled 2024-02-08: qty 1

## 2024-02-08 MED ORDER — SODIUM CHLORIDE 0.9 % IV BOLUS
500.0000 mL | Freq: Once | INTRAVENOUS | Status: AC
  Administered 2024-02-08: 500 mL via INTRAVENOUS

## 2024-02-08 MED ORDER — FENTANYL CITRATE PF 50 MCG/ML IJ SOSY
50.0000 ug | PREFILLED_SYRINGE | Freq: Once | INTRAMUSCULAR | Status: AC
  Administered 2024-02-08: 50 ug via INTRAVENOUS
  Filled 2024-02-08: qty 1

## 2024-02-08 MED ORDER — MAGNESIUM SULFATE 2 GM/50ML IV SOLN
2.0000 g | Freq: Once | INTRAVENOUS | Status: AC
  Administered 2024-02-08: 2 g via INTRAVENOUS
  Filled 2024-02-08: qty 50

## 2024-02-08 MED ORDER — ONDANSETRON 4 MG PO TBDP
4.0000 mg | ORAL_TABLET | Freq: Once | ORAL | Status: AC
  Administered 2024-02-08: 4 mg via ORAL
  Filled 2024-02-08: qty 1

## 2024-02-08 MED ORDER — OXYCODONE-ACETAMINOPHEN 5-325 MG PO TABS
1.0000 | ORAL_TABLET | Freq: Once | ORAL | Status: AC
  Administered 2024-02-08: 1 via ORAL
  Filled 2024-02-08: qty 1

## 2024-02-08 MED ORDER — PHENAZOPYRIDINE HCL 100 MG PO TABS
100.0000 mg | ORAL_TABLET | Freq: Once | ORAL | Status: AC
  Administered 2024-02-08: 100 mg via ORAL
  Filled 2024-02-08: qty 1

## 2024-02-08 MED ORDER — SULFAMETHOXAZOLE-TRIMETHOPRIM 800-160 MG PO TABS: 1.0000 | ORAL_TABLET | Freq: Two times a day (BID) | ORAL | 0 refills | Status: AC

## 2024-02-08 MED ORDER — FLUCONAZOLE 150 MG PO TABS
150.0000 mg | ORAL_TABLET | Freq: Once | ORAL | Status: AC
  Administered 2024-02-08: 150 mg via ORAL
  Filled 2024-02-08: qty 1

## 2024-02-08 MED ORDER — CEPHALEXIN 500 MG PO CAPS
500.0000 mg | ORAL_CAPSULE | Freq: Once | ORAL | Status: AC
  Administered 2024-02-08: 500 mg via ORAL
  Filled 2024-02-08: qty 1

## 2024-02-08 NOTE — ED Provider Triage Note (Signed)
 Emergency Medicine Provider Triage Evaluation Note  DAYTONA HEDMAN , a 63 y.o. female  was evaluated in triage.  Pt complains of UTI.  Patient presents the ED today for suprapubic discomfort.  She was seen 2 days ago and diagnosed with a UTI.  She started the antibiotics 48 hours ago and is complaining of suprapubic discomfort with associated nausea.  Review of Systems  Positive: *** Negative: ***  Physical Exam  BP 99/71 (BP Location: Left Arm)   Pulse 89   Temp 98 F (36.7 C)   Resp 16   Ht 4\' 11"  (1.499 m)   Wt 55.2 kg   LMP 08/11/2012   SpO2 98%   BMI 24.58 kg/m  Gen:   Awake, crying in triage Resp:  Normal effort  MSK:   Moves extremities without difficulty  Other:  No neurologic focal deficits, suprapubic tenderness to palpation  Medical Decision Making  Medically screening exam initiated at 6:38 AM.  Appropriate orders placed.  Serafina C Bullinger was informed that the remainder of the evaluation will be completed by another provider, this initial triage assessment does not replace that evaluation, and the importance of remaining in the ED until their evaluation is complete.  ***

## 2024-02-08 NOTE — ED Notes (Signed)
 Pt can be heard crying from the nurses station due to pain, once this RN entered the room the patient was seen texting, not crying, and had her legs propped up on the side table. Pt was informed that her behavior was inappropriate and that she would need to wait in the lobby due to triage being complete at this time.

## 2024-02-08 NOTE — Discharge Instructions (Addendum)
 Follow up with urology as scheduled. Return to the ER for any new or worsening symptoms.

## 2024-02-08 NOTE — ED Provider Notes (Signed)
 Patient is a handoff from [employer], [place]. In short, patient is here with concerns of UTI symptoms. States she was diagnosed with a UTI a few days ago while in the ED and started on keflex and pyridium. No significant improvement and she is reporting decline in symptoms since starting medications. Patient has been admitted numerous times primarily with concerns for poorly controlled pain. Asking for Dilaudid as nothing else works well. Physical Exam  BP 122/82   Pulse 80   Temp 98 F (36.7 C) (Oral)   Resp 18   Ht 4\' 11"  (1.499 m)   Wt 55.2 kg   LMP 08/11/2012   SpO2 96%   BMI 24.58 kg/m   Physical Exam Vitals and nursing note reviewed.  Constitutional:      Appearance: Normal appearance.     Comments: Crying on exam  HENT:     Head: Normocephalic and atraumatic.     Mouth/Throat:     Mouth: Mucous membranes are moist.  Eyes:     Conjunctiva/sclera: Conjunctivae normal.     Pupils: Pupils are equal, round, and reactive to light.  Cardiovascular:     Rate and Rhythm: Normal rate and regular rhythm.     Pulses: Normal pulses.     Heart sounds: Normal heart sounds.  Pulmonary:     Effort: Pulmonary effort is normal.     Breath sounds: Normal breath sounds.  Abdominal:     Palpations: Abdomen is soft.     Tenderness: There is abdominal tenderness.     Comments: Suprapubic tenderness to palpation  Musculoskeletal:        General: Normal range of motion.     Cervical back: Normal range of motion.  Skin:    General: Skin is warm and dry.     Findings: No rash.  Neurological:     General: No focal deficit present.     Mental Status: She is alert.  Psychiatric:        Mood and Affect: Mood normal.        Behavior: Behavior normal.     Procedures  Procedures  ED Course / MDM    Medical Decision Making Amount and/or Complexity of Data Reviewed Labs: ordered. Radiology: ordered.  Risk Prescription drug management.   Patient is a handoff from [employer], [place]. In short,  patient is here with concerns of UTI symptoms. States she was diagnosed with a UTI a few days ago while in the ED and started on keflex and pyridium. No significant improvement and she is reporting decline in symptoms since starting medications. Patient has been admitted numerous times primarily with concerns for poorly controlled pain. Asking for Dilaudid as nothing else works well. Currently waiting for repeat UA due to concerns of contamination on initial sample.  Repeat UA shows likely worsening infection now showing nitrites with few bacteria present. No culture collected on last urine sample. Will send off to culture. Patient is requesting admission due to concerns of pain as she feels that only Dilaudid is helpful in controlling her pain. States that she has gotten no relief from oxycodone here in the ED. Do not feel that patient is currently requiring admission given reassuring labs and imaging. Will change patient to Bactrim DS given failure to improve after 4 days of Keflex.  Patient is hesitant to discharge due to pain concerns. Appears that pain gets admitted extensively for poorly controlled pain in a variety of settings. Specifically asking for pain medicine that starts with a "d".  I assume this is poorly controlled chronic pain. Advised that we will discharge home with a change in antibiotics and wait for cultures to result. Patient not in agreement with this plan due to concerns of pain control at home. Do not feel that it is appropriate to discharge with narcotics for UTI related pain.         Smitty Knudsen, PA-C 02/08/24 1733    Derwood Kaplan, MD 02/08/24 Windell Moment

## 2024-02-08 NOTE — ED Provider Notes (Signed)
 [facility] AT [facility] Provider Note   CSN: [id number] Arrival date & time: 02/08/24  0449     History  Chief Complaint  Patient presents with   Urinary Tract Infection    [name] [name] is a 63 y.o. female with a history of esophagitis's, intractable nausea and vomiting, and chronic pain syndrome who presents the ED today for abdominal pain.  Patient was seen 2 days ago and diagnosed with a UTI.  She has been taking her antibiotics for 2 days but endorses severe suprapubic pain and nausea.  Denies pain radiating to her back, fevers, or vomiting.  No changes to bowel habits.    Home Medications Prior to Admission medications   Medication Sig Start Date End Date Taking? Authorizing Provider  aspirin EC 81 MG tablet Take 81 mg by mouth daily. Swallow whole.    [provider]  azelastine (ASTELIN) 0.1 % nasal spray Place 1 spray into both nostrils as needed for rhinitis or allergies. 11/04/23   [provider]  cephALEXin (KEFLEX) 500 MG capsule Take 1 capsule (500 mg total) by mouth 4 (four) times daily. 02/06/24   Palumbo, April, MD  Continuous Blood Gluc Transmit (DEXCOM G6 TRANSMITTER) MISC USE AS DIRECTED FOR CONTINUOUS GLUCOSE MONITORING. REUSE TRANSMITTER X 90DAYS THEN DISCARD & REPLACE 07/05/21   Bedsole, Amy E, MD  escitalopram (LEXAPRO) 10 MG tablet Take 1 tablet (10 mg total) by mouth daily. 11/18/23   Rodolph Bong, MD  Glucagon, rDNA, (GLUCAGON EMERGENCY) 1 MG KIT Inject 1 mg into the skin See admin instructions. Inject one mg into the skin see administration instructions. Follow package directions for low blood sugar. 06/18/23   [provider]  levothyroxine (SYNTHROID) 137 MCG tablet Take 137 mcg by mouth daily before breakfast. 03/19/22   [provider]  metoCLOPramide (REGLAN) 5 MG tablet Take 1 tablet (5 mg total) by mouth 3 (three) times daily before meals. 11/12/23   Danford, Earl Lites, MD   NOVOLOG FLEXPEN 100 UNIT/ML FlexPen Inject 10 Units into the skin 3 (three) times daily with meals. Plus sliding scale TID 12/08/23   Azucena Fallen, MD  omega-3 acid ethyl esters (LOVAZA) 1 g capsule Take 2 capsules (2 g total) by mouth 2 (two) times daily. 09/13/23   Alwyn Ren, MD  ondansetron (ZOFRAN) 4 MG tablet Take 2 tablets (8 mg total) by mouth every 8 (eight) hours as needed for nausea or vomiting. 12/25/23   Mansouraty, Netty Starring., MD  ondansetron (ZOFRAN-ODT) 8 MG disintegrating tablet Take 1 tablet (8 mg total) by mouth every 8 (eight) hours as needed for nausea or vomiting. 01/10/24   Lorre Nick, MD  ondansetron (ZOFRAN-ODT) 8 MG disintegrating tablet 8mg  ODT q8 hours prn nausea 02/06/24   Palumbo, April, MD  pantoprazole (PROTONIX) 40 MG tablet Take 1 tablet (40 mg total) by mouth 2 (two) times daily. Take 30 minutes before breakfast and dinner 01/16/24   Arnaldo Natal, NP  phenazopyridine (PYRIDIUM) 200 MG tablet Take 1 tablet (200 mg total) by mouth 3 (three) times daily. 02/06/24   Palumbo, April, MD  QUEtiapine (SEROQUEL) 50 MG tablet Take 50 mg by mouth at bedtime. 09/09/23   [provider]  rosuvastatin (CRESTOR) 10 MG tablet Take 1 tablet (10 mg total) by mouth daily. 11/18/23   Rodolph Bong, MD  sucralfate (CARAFATE) 1 g tablet Take 1 tablet (1 g total) by mouth 2 (two) times daily. Do not take  any other medication within 2-4 hours. 12/25/23   Mansouraty, Netty Starring., MD  tamsulosin (FLOMAX) 0.4 MG CAPS capsule Take 1 capsule (0.4 mg total) by mouth daily after supper. 01/06/24   Danford, Earl Lites, MD  TOUJEO SOLOSTAR 300 UNIT/ML Solostar Pen Inject 25 Units into the skin at bedtime. 12/08/23   Azucena Fallen, MD  traMADol (ULTRAM) 50 MG tablet 1 to 2 tablets every 6 hours with extra strength Tylenol for pain as needed Patient taking differently: Take 50 mg by mouth as needed for moderate pain (pain score 4-6) or severe pain (pain  score 7-10). 12/12/23   Arby Barrette, MD  traMADol (ULTRAM) 50 MG tablet Take 1 tablet (50 mg total) by mouth every 6 (six) hours as needed. 02/06/24   Palumbo, April, MD  traZODone (DESYREL) 50 MG tablet Take 50 mg by mouth at bedtime as needed for sleep.    [provider]      Allergies    Codeine    Review of Systems   Review of Systems  Gastrointestinal:  Positive for abdominal pain.  All other systems reviewed and are negative.   Physical Exam Updated Vital Signs BP 122/82   Pulse 80   Temp 98 F (36.7 C) (Oral)   Resp 18   Ht 4\' 11"  (1.499 m)   Wt 55.2 kg   LMP 08/11/2012   SpO2 96%   BMI 24.58 kg/m  Physical Exam Vitals and nursing note reviewed.  Constitutional:      Appearance: Normal appearance.     Comments: Crying on exam  HENT:     Head: Normocephalic and atraumatic.     Mouth/Throat:     Mouth: Mucous membranes are moist.  Eyes:     Conjunctiva/sclera: Conjunctivae normal.     Pupils: Pupils are equal, round, and reactive to light.  Cardiovascular:     Rate and Rhythm: Normal rate and regular rhythm.     Pulses: Normal pulses.     Heart sounds: Normal heart sounds.  Pulmonary:     Effort: Pulmonary effort is normal.     Breath sounds: Normal breath sounds.  Abdominal:     Palpations: Abdomen is soft.     Tenderness: There is abdominal tenderness.     Comments: Suprapubic tenderness to palpation  Musculoskeletal:        General: Normal range of motion.     Cervical back: Normal range of motion.  Skin:    General: Skin is warm and dry.     Findings: No rash.  Neurological:     General: No focal deficit present.     Mental Status: She is alert.  Psychiatric:        Mood and Affect: Mood normal.        Behavior: Behavior normal.    ED Results / Procedures / Treatments   Labs (all labs ordered are listed, but only abnormal results are displayed) Labs Reviewed  COMPREHENSIVE METABOLIC PANEL - Abnormal; Notable for the following  components:      Result Value   Sodium 134 (*)    Chloride 93 (*)    Glucose, Bld 339 (*)    BUN 30 (*)    AST 14 (*)    All other components within normal limits  URINALYSIS, W/ REFLEX TO CULTURE (INFECTION SUSPECTED) - Abnormal; Notable for the following components:   Color, Urine AMBER (*)    APPearance HAZY (*)    Glucose, UA >=500 (*)  Ketones, ur 20 (*)    Protein, ur 30 (*)    Nitrite POSITIVE (*)    Bacteria, UA FEW (*)    All other components within normal limits  CBC WITH DIFFERENTIAL/PLATELET  URINALYSIS, W/ REFLEX TO CULTURE (INFECTION SUSPECTED)    EKG None  Radiology CT ABDOMEN PELVIS W CONTRAST Result Date: 02/08/2024 CLINICAL DATA:  UTI, recurrent/complicated (Female) EXAM: CT ABDOMEN AND PELVIS WITH CONTRAST TECHNIQUE: Multidetector CT imaging of the abdomen and pelvis was performed using the standard protocol following bolus administration of intravenous contrast. RADIATION DOSE REDUCTION: This exam was performed according to the departmental dose-optimization program which includes automated exposure control, adjustment of the mA and/or kV according to patient size and/or use of iterative reconstruction technique. CONTRAST:  80mL OMNIPAQUE IOHEXOL 300 MG/ML  SOLN COMPARISON:  Multiple prior exams, most recent CT 01/21/2024. FINDINGS: Lower chest: Wall thickening of the distal esophagus. No focal airspace disease or pleural effusion in the lung bases. Hepatobiliary: Focal fatty infiltration adjacent to the falciform ligament. No suspicious liver lesion gallbladder Phrygian cap. No calcified gallstones. No pericholecystic inflammation. Stable biliary tree, common bile duct measures 8 mm distally. No visible choledocholithiasis. Pancreas: No ductal dilatation or inflammation. Spleen: Normal in size without focal abnormality. Adrenals/Urinary Tract: No adrenal nodule. No hydronephrosis. Homogeneous renal enhancement. No urothelial thickening. No perinephric stranding. No  renal calculi or focal renal abnormalities. Moderate diffuse bladder wall thickening. Mild bladder enhancement. No significant perivesicular fat stranding. Questionable left lateral bladder diverticulum. No air in the urinary bladder or bladder wall. Stomach/Bowel: Moderate fluid distention of the stomach, similar to prior exam. No small bowel dilatation or obstruction. Scattered colonic diverticula without diverticulitis. Normal appendix. Small to moderate colonic stool burden. Vascular/Lymphatic: Aortic atherosclerosis. No aneurysm. The portal vein is patent. No enlarged lymph nodes in the abdomen or pelvis. Reproductive: Status post hysterectomy. No adnexal masses. Other: No ascites. No free air. No intra-abdominal collection. Small fat containing umbilical hernia. Musculoskeletal: There are no acute or suspicious osseous abnormalities. IMPRESSION: 1. Moderate diffuse bladder wall thickening with mild bladder enhancement, suspicious for cystitis. 2. No CT evidence of pyelonephritis. 3. Colonic diverticulosis without diverticulitis. 4. Wall thickening of the distal esophagus, can be seen with reflux/esophagitis. Aortic Atherosclerosis (ICD10-I70.0). Electronically Signed   By: Narda Rutherford M.D.   On: 02/08/2024 13:07   CT Head Wo Contrast Result Date: 02/08/2024 CLINICAL DATA:  Uncomplicated neck trauma.  Fall. EXAM: CT HEAD WITHOUT CONTRAST CT CERVICAL SPINE WITHOUT CONTRAST TECHNIQUE: Multidetector CT imaging of the head and cervical spine was performed following the standard protocol without intravenous contrast. Multiplanar CT image reconstructions of the cervical spine were also generated. RADIATION DOSE REDUCTION: This exam was performed according to the departmental dose-optimization program which includes automated exposure control, adjustment of the mA and/or kV according to patient size and/or use of iterative reconstruction technique. COMPARISON:  12/12/2023 FINDINGS: CT HEAD FINDINGS Brain: No  evidence of acute infarction, hemorrhage, hydrocephalus, extra-axial collection or mass lesion/mass effect. Vascular: No hyperdense vessel or unexpected calcification. Skull: Normal. Negative for fracture or focal lesion. Sinuses/Orbits: No evidence of injury CT CERVICAL SPINE FINDINGS Alignment: No traumatic malalignment. Skull base and vertebrae: No acute fracture. No primary bone lesion or focal pathologic process. Soft tissues and spinal canal: No prevertebral fluid or swelling. No visible canal hematoma. Disc levels: Generalized degenerative disc narrowing with endplate degeneration, bulging, and ridging especially at C3-4 to C5-6. Upper chest: Clear apical lungs. IMPRESSION: No evidence of acute intracranial or cervical spine injury.  Electronically Signed   By: Tiburcio Pea M.D.   On: 02/08/2024 07:05   CT Cervical Spine Wo Contrast Result Date: 02/08/2024 CLINICAL DATA:  Uncomplicated neck trauma.  Fall. EXAM: CT HEAD WITHOUT CONTRAST CT CERVICAL SPINE WITHOUT CONTRAST TECHNIQUE: Multidetector CT imaging of the head and cervical spine was performed following the standard protocol without intravenous contrast. Multiplanar CT image reconstructions of the cervical spine were also generated. RADIATION DOSE REDUCTION: This exam was performed according to the departmental dose-optimization program which includes automated exposure control, adjustment of the mA and/or kV according to patient size and/or use of iterative reconstruction technique. COMPARISON:  12/12/2023 FINDINGS: CT HEAD FINDINGS Brain: No evidence of acute infarction, hemorrhage, hydrocephalus, extra-axial collection or mass lesion/mass effect. Vascular: No hyperdense vessel or unexpected calcification. Skull: Normal. Negative for fracture or focal lesion. Sinuses/Orbits: No evidence of injury CT CERVICAL SPINE FINDINGS Alignment: No traumatic malalignment. Skull base and vertebrae: No acute fracture. No primary bone lesion or focal pathologic  process. Soft tissues and spinal canal: No prevertebral fluid or swelling. No visible canal hematoma. Disc levels: Generalized degenerative disc narrowing with endplate degeneration, bulging, and ridging especially at C3-4 to C5-6. Upper chest: Clear apical lungs. IMPRESSION: No evidence of acute intracranial or cervical spine injury. Electronically Signed   By: Tiburcio Pea M.D.   On: 02/08/2024 07:05    Procedures Procedures: not indicated.   Medications Ordered in ED Medications  magnesium sulfate IVPB 2 g 50 mL (2 g Intravenous New Bag/Given 02/08/24 1516)  phenazopyridine (PYRIDIUM) tablet 100 mg (100 mg Oral Given 02/08/24 0648)  ondansetron (ZOFRAN-ODT) disintegrating tablet 4 mg (4 mg Oral Given 02/08/24 0649)  oxyCODONE-acetaminophen (PERCOCET/ROXICET) 5-325 MG per tablet 1 tablet (1 tablet Oral Given 02/08/24 1157)  iohexol (OMNIPAQUE) 300 MG/ML solution 80 mL (80 mLs Intravenous Contrast Given 02/08/24 1227)  fluconazole (DIFLUCAN) tablet 150 mg (150 mg Oral Given 02/08/24 1259)  ondansetron (ZOFRAN-ODT) disintegrating tablet 8 mg (8 mg Oral Given 02/08/24 1305)  oxyCODONE (Oxy IR/ROXICODONE) immediate release tablet 5 mg (5 mg Oral Given 02/08/24 1305)  sodium chloride 0.9 % bolus 500 mL (500 mLs Intravenous New Bag/Given 02/08/24 1444)  cephALEXin (KEFLEX) capsule 500 mg (500 mg Oral Given 02/08/24 1442)    ED Course/ Medical Decision Making/ A&P                                 Medical Decision Making Amount and/or Complexity of Data Reviewed Labs: ordered. Radiology: ordered.  Risk Prescription drug management.   This patient presents to the ED for concern of suprapubic pain, this involves an extensive number of treatment options, and is a complaint that carries with it a high risk of complications and morbidity.   Differential diagnosis includes: Worsening UTI, pyelonephritis, kidney stone, bowel obstruction, perforation, etc.   Comorbidities  See HPI above   Additional  History  Additional history obtained from recent hospitalization and ED records.   Lab Tests  I ordered and personally interpreted labs.  The pertinent results include:   Positive nitrites, budding yeast, and few bacteria, squamous cell epithelial present on UA. Possible dirty catch.  Urine reordered with in-and-out cath to get clean catch Glucose of 339, BUN of 30 otherwise CMP is within normal limits CBC is unremarkable   Imaging Studies  I ordered imaging studies including CT head, cervical spine, and abdomen/pelvis  I independently visualized and interpreted imaging which showed:  No evidence  of acute intracranial or cervical spine injury I agree with the radiologist interpretation   Problem List / ED Course / Critical Interventions / Medication Management  Patient reports severe suprapubic pain for the past several days.  Was diagnosed with a UTI in the ED 2 days ago and was started on Keflex at that time. She was agitated in triage and stood up and fell and hit the left side of her head.  No LOC or blood thinner use.  No neuro focal deficits. Imaging was obtained and was unremarkable. I ordered medications including: Pyridium and Zofran given in triage for suprapubic pain Diflucan for yeast infection Percocet and Zofran for pain and nausea for pain on reevaluation Oxycodone for recurrent pain Reevaluation of the patient after these medicines showed that the patient stayed the same. Fluids given for dehydration. Keflex dose given. Toradol and magnesium then given for pain. I have reviewed the patients home medicines and have made adjustments as needed Staffed with my attending, Dr. Rubin Payor.   Social Determinants of Health  Depression    Test / Admission - Considered  Disposition pending at shift change. Patient        Final Clinical Impression(s) / ED Diagnoses Final diagnoses:  Acute cystitis without hematuria    Rx / DC Orders ED Discharge Orders      None         Maxwell Marion, PA-C 02/08/24 1554    Benjiman Core, MD 02/09/24 [phone]

## 2024-02-08 NOTE — ED Notes (Addendum)
 Pt was in lobby stood up and lost balance and fell on the floor. Pt stated she hit the left side of her head. RN, MD, and PA aware.

## 2024-02-08 NOTE — ED Notes (Signed)
 Patient transported to CT

## 2024-02-08 NOTE — ED Triage Notes (Signed)
 Pt presents to the ED via [facility] from home after being diagnosed with a UTI 2 days ago and her sx have not changed. Pt has been taking her antibiotics and prescribed pain meds without any improvement in her sx. A&Ox4 at this time. Denies CP or SOB.    CBG- 400 with EMS.

## 2024-02-10 LAB — URINE CULTURE
Culture: NO GROWTH
Special Requests: NORMAL

## 2024-02-25 ENCOUNTER — Encounter: Payer: Self-pay | Admitting: Gastroenterology

## 2024-04-28 DIAGNOSIS — Z9641 Presence of insulin pump (external) (internal): Secondary | ICD-10-CM | POA: Insufficient documentation

## 2024-05-28 ENCOUNTER — Other Ambulatory Visit: Payer: Self-pay

## 2024-05-28 ENCOUNTER — Inpatient Hospital Stay (HOSPITAL_COMMUNITY)
Admission: EM | Admit: 2024-05-28 | Discharge: 2024-05-31 | DRG: 690 | Disposition: A | Discharge status: Home / Self Care | Attending: Internal Medicine | Admitting: Internal Medicine

## 2024-05-28 ENCOUNTER — Emergency Department (HOSPITAL_COMMUNITY)

## 2024-05-28 ENCOUNTER — Encounter (HOSPITAL_COMMUNITY): Payer: Self-pay | Admitting: Internal Medicine

## 2024-05-28 DIAGNOSIS — Z87891 Personal history of nicotine dependence: Secondary | ICD-10-CM

## 2024-05-28 DIAGNOSIS — M810 Age-related osteoporosis without current pathological fracture: Secondary | ICD-10-CM | POA: Diagnosis present

## 2024-05-28 DIAGNOSIS — Z79899 Other long term (current) drug therapy: Secondary | ICD-10-CM

## 2024-05-28 DIAGNOSIS — Z833 Family history of diabetes mellitus: Secondary | ICD-10-CM

## 2024-05-28 DIAGNOSIS — N12 Tubulo-interstitial nephritis, not specified as acute or chronic: Secondary | ICD-10-CM | POA: Diagnosis present

## 2024-05-28 DIAGNOSIS — Z7982 Long term (current) use of aspirin: Secondary | ICD-10-CM

## 2024-05-28 DIAGNOSIS — E039 Hypothyroidism, unspecified: Secondary | ICD-10-CM | POA: Diagnosis present

## 2024-05-28 DIAGNOSIS — K219 Gastro-esophageal reflux disease without esophagitis: Secondary | ICD-10-CM | POA: Diagnosis present

## 2024-05-28 DIAGNOSIS — E1043 Type 1 diabetes mellitus with diabetic autonomic (poly)neuropathy: Secondary | ICD-10-CM | POA: Diagnosis present

## 2024-05-28 DIAGNOSIS — K3184 Gastroparesis: Secondary | ICD-10-CM | POA: Diagnosis present

## 2024-05-28 DIAGNOSIS — Z8711 Personal history of peptic ulcer disease: Secondary | ICD-10-CM

## 2024-05-28 DIAGNOSIS — Z8249 Family history of ischemic heart disease and other diseases of the circulatory system: Secondary | ICD-10-CM

## 2024-05-28 DIAGNOSIS — Z79890 Hormone replacement therapy (postmenopausal): Secondary | ICD-10-CM

## 2024-05-28 DIAGNOSIS — Z90710 Acquired absence of both cervix and uterus: Secondary | ICD-10-CM

## 2024-05-28 DIAGNOSIS — G8929 Other chronic pain: Secondary | ICD-10-CM | POA: Diagnosis present

## 2024-05-28 DIAGNOSIS — I480 Paroxysmal atrial fibrillation: Secondary | ICD-10-CM | POA: Diagnosis present

## 2024-05-28 DIAGNOSIS — N182 Chronic kidney disease, stage 2 (mild): Secondary | ICD-10-CM | POA: Diagnosis present

## 2024-05-28 DIAGNOSIS — R748 Abnormal levels of other serum enzymes: Secondary | ICD-10-CM | POA: Diagnosis present

## 2024-05-28 DIAGNOSIS — Z794 Long term (current) use of insulin: Secondary | ICD-10-CM

## 2024-05-28 DIAGNOSIS — R112 Nausea with vomiting, unspecified: Secondary | ICD-10-CM

## 2024-05-28 DIAGNOSIS — Z885 Allergy status to narcotic agent status: Secondary | ICD-10-CM

## 2024-05-28 DIAGNOSIS — R1013 Epigastric pain: Secondary | ICD-10-CM | POA: Diagnosis not present

## 2024-05-28 DIAGNOSIS — E785 Hyperlipidemia, unspecified: Secondary | ICD-10-CM | POA: Diagnosis present

## 2024-05-28 DIAGNOSIS — E1022 Type 1 diabetes mellitus with diabetic chronic kidney disease: Secondary | ICD-10-CM | POA: Diagnosis present

## 2024-05-28 DIAGNOSIS — K529 Noninfective gastroenteritis and colitis, unspecified: Secondary | ICD-10-CM | POA: Diagnosis present

## 2024-05-28 DIAGNOSIS — E109 Type 1 diabetes mellitus without complications: Secondary | ICD-10-CM | POA: Diagnosis present

## 2024-05-28 DIAGNOSIS — I130 Hypertensive heart and chronic kidney disease with heart failure and stage 1 through stage 4 chronic kidney disease, or unspecified chronic kidney disease: Secondary | ICD-10-CM | POA: Diagnosis present

## 2024-05-28 DIAGNOSIS — I5032 Chronic diastolic (congestive) heart failure: Secondary | ICD-10-CM | POA: Diagnosis present

## 2024-05-28 DIAGNOSIS — Z820 Family history of epilepsy and other diseases of the nervous system: Secondary | ICD-10-CM

## 2024-05-28 DIAGNOSIS — I2510 Atherosclerotic heart disease of native coronary artery without angina pectoris: Secondary | ICD-10-CM | POA: Diagnosis present

## 2024-05-28 DIAGNOSIS — E1065 Type 1 diabetes mellitus with hyperglycemia: Secondary | ICD-10-CM | POA: Diagnosis present

## 2024-05-28 LAB — URINALYSIS, ROUTINE W REFLEX MICROSCOPIC
Bilirubin Urine: NEGATIVE
Glucose, UA: NEGATIVE mg/dL
Hgb urine dipstick: NEGATIVE
Ketones, ur: 5 mg/dL — AB
Nitrite: POSITIVE — AB
Protein, ur: NEGATIVE mg/dL
Specific Gravity, Urine: >1.046 — ABNORMAL HIGH (ref 1.005–1.030)
WBC, UA: >50 WBC/hpf (ref 0–5)
pH: 7 (ref 5.0–8.0)

## 2024-05-28 LAB — COMPREHENSIVE METABOLIC PANEL WITH GFR
ALT: 15 U/L (ref 0–44)
AST: 18 U/L (ref 15–41)
Albumin: 3.9 g/dL (ref 3.5–5.0)
Alkaline Phosphatase: 74 U/L (ref 38–126)
Anion gap: 13 (ref 5–15)
BUN: 18 mg/dL (ref 6–20)
CO2: 28 mmol/L (ref 22–32)
Calcium: 9.1 mg/dL (ref 8.9–10.3)
Chloride: 97 mmol/L — ABNORMAL LOW (ref 98–111)
Creatinine, Ser: 0.8 mg/dL (ref 0.44–1.00)
GFR, Estimated: >60 mL/min (ref >60)
Glucose, Bld: 157 mg/dL — ABNORMAL HIGH (ref 70–99)
Potassium: 3.7 mmol/L (ref 3.5–5.1)
Sodium: 138 mmol/L (ref 135–145)
Total Bilirubin: 0.5 mg/dL (ref 0.0–1.2)
Total Protein: 7.6 g/dL (ref 6.5–8.1)

## 2024-05-28 LAB — LIPASE, BLOOD: Lipase: 62 U/L — ABNORMAL HIGH (ref 11–51)

## 2024-05-28 LAB — CBC
HCT: 47.8 % — ABNORMAL HIGH (ref 36.0–46.0)
Hemoglobin: 15.6 g/dL — ABNORMAL HIGH (ref 12.0–15.0)
MCH: 28.6 pg (ref 26.0–34.0)
MCHC: 32.6 g/dL (ref 30.0–36.0)
MCV: 87.7 fL (ref 80.0–100.0)
Platelets: 352 10*3/uL (ref 150–400)
RBC: 5.45 MIL/uL — ABNORMAL HIGH (ref 3.87–5.11)
RDW: 15.9 % — ABNORMAL HIGH (ref 11.5–15.5)
WBC: 9.7 10*3/uL (ref 4.0–10.5)
nRBC: 0 % (ref 0.0–0.2)

## 2024-05-28 LAB — GLUCOSE, CAPILLARY
Glucose-Capillary: 141 mg/dL — ABNORMAL HIGH (ref 70–99)
Glucose-Capillary: 118 mg/dL — ABNORMAL HIGH (ref 70–99)
Glucose-Capillary: 153 mg/dL — ABNORMAL HIGH (ref 70–99)

## 2024-05-28 MED ORDER — PANTOPRAZOLE SODIUM 40 MG IV SOLR: 40.0000 mg | INTRAVENOUS | Status: DC

## 2024-05-28 MED ORDER — HYDROMORPHONE HCL 1 MG/ML IJ SOLN
0.5000 mg | Freq: Once | INTRAMUSCULAR | Status: AC
  Administered 2024-05-28: 0.5 mg via INTRAVENOUS
  Filled 2024-05-28: qty 1

## 2024-05-28 MED ORDER — SUCRALFATE 1 G PO TABS
1.0000 g | ORAL_TABLET | Freq: Two times a day (BID) | ORAL | Status: DC
  Administered 2024-05-28 – 2024-05-31 (×7): 1 g via ORAL
  Filled 2024-05-28 (×7): qty 1

## 2024-05-28 MED ORDER — MORPHINE SULFATE (PF) 2 MG/ML IV SOLN
2.0000 mg | INTRAVENOUS | Status: AC | PRN
  Administered 2024-05-28 – 2024-05-29 (×2): 2 mg via INTRAVENOUS
  Filled 2024-05-28 (×2): qty 1

## 2024-05-28 MED ORDER — ONDANSETRON HCL 4 MG/2ML IJ SOLN
4.0000 mg | Freq: Four times a day (QID) | INTRAMUSCULAR | Status: DC | PRN
  Administered 2024-05-28 – 2024-05-29 (×3): 4 mg via INTRAVENOUS
  Filled 2024-05-28 (×3): qty 2

## 2024-05-28 MED ORDER — ORAL CARE MOUTH RINSE: 15.0000 mL | OROMUCOSAL | Status: DC | PRN

## 2024-05-28 MED ORDER — ENOXAPARIN SODIUM 40 MG/0.4ML IJ SOSY
40.0000 mg | PREFILLED_SYRINGE | INTRAMUSCULAR | Status: DC
  Administered 2024-05-28 – 2024-05-30 (×3): 40 mg via SUBCUTANEOUS
  Filled 2024-05-28 (×3): qty 0.4

## 2024-05-28 MED ORDER — PANTOPRAZOLE SODIUM 40 MG IV SOLR
40.0000 mg | Freq: Once | INTRAVENOUS | Status: AC
  Administered 2024-05-28: 40 mg via INTRAVENOUS
  Filled 2024-05-28: qty 10

## 2024-05-28 MED ORDER — HYDROMORPHONE HCL 1 MG/ML IJ SOLN
1.0000 mg | Freq: Once | INTRAMUSCULAR | Status: AC
  Administered 2024-05-28: 1 mg via INTRAVENOUS
  Filled 2024-05-28: qty 1

## 2024-05-28 MED ORDER — SODIUM CHLORIDE 0.9 % IV SOLN
1.0000 g | INTRAVENOUS | Status: DC
  Administered 2024-05-28 – 2024-05-29 (×2): 1 g via INTRAVENOUS
  Filled 2024-05-28 (×2): qty 10

## 2024-05-28 MED ORDER — DIPHENHYDRAMINE HCL 25 MG PO CAPS
25.0000 mg | ORAL_CAPSULE | Freq: Four times a day (QID) | ORAL | Status: DC | PRN
  Administered 2024-05-28 – 2024-05-31 (×4): 25 mg via ORAL
  Filled 2024-05-28 (×4): qty 1

## 2024-05-28 MED ORDER — INSULIN ASPART 100 UNIT/ML IJ SOLN
0.0000 [IU] | Freq: Three times a day (TID) | INTRAMUSCULAR | Status: DC
  Administered 2024-05-29 – 2024-05-30 (×3): 3 [IU] via SUBCUTANEOUS
  Administered 2024-05-31: 2 [IU] via SUBCUTANEOUS
  Administered 2024-05-31: 5 [IU] via SUBCUTANEOUS
  Filled 2024-05-28: qty 0.15

## 2024-05-28 MED ORDER — LACTATED RINGERS IV BOLUS
1000.0000 mL | Freq: Once | INTRAVENOUS | Status: AC
  Administered 2024-05-28: 1000 mL via INTRAVENOUS

## 2024-05-28 MED ORDER — INSULIN ASPART 100 UNIT/ML IJ SOLN
0.0000 [IU] | Freq: Every day | INTRAMUSCULAR | Status: DC
  Filled 2024-05-28: qty 0.05

## 2024-05-28 MED ORDER — ONDANSETRON HCL 4 MG/2ML IJ SOLN
4.0000 mg | Freq: Once | INTRAMUSCULAR | Status: AC | PRN
  Administered 2024-05-28: 4 mg via INTRAVENOUS
  Filled 2024-05-28: qty 2

## 2024-05-28 MED ORDER — IOHEXOL 300 MG/ML  SOLN
100.0000 mL | Freq: Once | INTRAMUSCULAR | Status: AC | PRN
  Administered 2024-05-28: 100 mL via INTRAVENOUS

## 2024-05-28 MED ORDER — ACETAMINOPHEN 650 MG RE SUPP: 650.0000 mg | Freq: Four times a day (QID) | RECTAL | Status: DC | PRN

## 2024-05-28 MED ORDER — ONDANSETRON HCL 4 MG PO TABS
4.0000 mg | ORAL_TABLET | Freq: Four times a day (QID) | ORAL | Status: DC | PRN
  Administered 2024-05-30: 4 mg via ORAL
  Filled 2024-05-28: qty 1

## 2024-05-28 MED ORDER — ACETAMINOPHEN 325 MG PO TABS
650.0000 mg | ORAL_TABLET | Freq: Four times a day (QID) | ORAL | Status: AC | PRN
Start: 2024-05-28 — End: ?
  Administered 2024-05-28 – 2024-05-29 (×2): 650 mg via ORAL
  Filled 2024-05-28 (×2): qty 2

## 2024-05-28 MED ORDER — METOCLOPRAMIDE HCL 5 MG/ML IJ SOLN
10.0000 mg | Freq: Once | INTRAMUSCULAR | Status: AC
  Administered 2024-05-28: 10 mg via INTRAVENOUS
  Filled 2024-05-28: qty 2

## 2024-05-28 MED ORDER — ALBUTEROL SULFATE (2.5 MG/3ML) 0.083% IN NEBU
2.5000 mg | INHALATION_SOLUTION | RESPIRATORY_TRACT | Status: DC | PRN
Start: 2024-05-28 — End: 2024-05-31

## 2024-05-28 MED ORDER — OXYCODONE HCL 5 MG PO TABS
5.0000 mg | ORAL_TABLET | ORAL | Status: DC | PRN
  Administered 2024-05-28: 5 mg via ORAL
  Filled 2024-05-28: qty 1

## 2024-05-28 NOTE — Plan of Care (Signed)

## 2024-05-28 NOTE — ED Triage Notes (Signed)
 Pt presents today BIB EMS with constant 10/10 abd pain that began at 0330 this morning. Pt has recurring UTIs and dropped a sample off at PCP yesterday for testing with increased wbc's, sent for culture.  138/90, 94HR, CBG147, 98%RA, 98.40F. endorsing nausea as well

## 2024-05-28 NOTE — ED Notes (Signed)
Patient is resting

## 2024-05-28 NOTE — ED Provider Notes (Signed)
 [place] [facility] AT [facility] Provider Note   CSN: [id number] Arrival date & time: 05/28/24  4098     History  Chief Complaint  Patient presents with   Abdominal Pain    [name] [name] is a 63 y.o. female with HTN, HLD, hypothyroidism, IBS, chronic diastolic congestive heart failure, CKD stage II, type 2 diabetes mellitus, history of DKA, gastroparesis, PUD who presents BIB EMS with constant 10/10 non-radiating RUQ/epigastric abd pain that began at 0330 this morning. Feels like previous episodes of pancreatitis. Has h/o cholecystectomy. Constant pain waxing/waning. +nausea with no vomiting. Endorses dysuria as well. Pt has recurring UTIs and states she recently was treated for one. She dropped a sample off at PCP yesterday for testing with increased wbc's, sent for culture. Denies drinking alcohol. Denies hematochezia/melena.    EMS vitals: 138/90, 94HR, CBG147, 98%RA, 98.39F.   Past Medical History:  Diagnosis Date   Allergy    seasonal   Anxiety    B12 deficiency    Chest pain 04/05/2017   CHF (congestive heart failure) ([name])    Diabetes mellitus type 1, uncontrolled    dr. 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) 10/29/2013   Leg cramping    @ night (09/18/2013)   Migraine    once q couple months (11/26/2018)   Osteoporosis    Peripheral neuropathy    Umbilical hernia    unrepaired (09/18/2013)       Home Medications Prior to Admission medications   Medication Sig Start Date End Date Taking? Authorizing Provider  aspirin  EC 81 MG tablet Take 81 mg by mouth daily. Swallow whole.    [provider]  azelastine  (ASTELIN ) 0.1 % nasal spray Place 1 spray into both  nostrils as needed for rhinitis or allergies. 11/04/23   [provider]  Continuous Blood Gluc Transmit (DEXCOM G6 TRANSMITTER) MISC USE AS DIRECTED FOR CONTINUOUS GLUCOSE MONITORING. REUSE TRANSMITTER X 90DAYS THEN DISCARD & REPLACE 07/05/21   Bedsole, Amy E, MD  escitalopram  (LEXAPRO ) 10 MG tablet Take 1 tablet (10 mg total) by mouth daily. 11/18/23   Armenta Landau, MD  Glucagon, rDNA, (GLUCAGON EMERGENCY) 1 MG KIT Inject 1 mg into the skin See admin instructions. Inject one mg into the skin see administration instructions. Follow package directions for low blood sugar. 06/18/23   [provider]  levothyroxine  (SYNTHROID ) 137 MCG tablet Take 137 mcg by mouth daily before breakfast. 03/19/22   [provider]  metoCLOPramide  (REGLAN ) 5 MG tablet Take 1 tablet (5 mg total) by mouth 3 (three) times daily before meals. 11/12/23   Danford, Willis Harter, MD  NOVOLOG  FLEXPEN 100 UNIT/ML FlexPen Inject 10 Units into the skin 3 (three) times daily with meals. Plus sliding scale TID 12/08/23   Haydee Lipa, MD  omega-3 acid ethyl esters (LOVAZA ) 1 g capsule Take 2 capsules (2 g total) by mouth 2 (two) times daily. 09/13/23   Barbee Lew, MD  ondansetron  (ZOFRAN ) 4 MG tablet Take 2 tablets (8 mg total) by mouth every 8 (eight) hours as needed for nausea or vomiting. 12/25/23   Mansouraty, Albino Alu., MD  ondansetron  (ZOFRAN -ODT) 8 MG disintegrating tablet Take 1 tablet (8  mg total) by mouth every 8 (eight) hours as needed for nausea or vomiting. 01/10/24   Lind Repine, MD  ondansetron  (ZOFRAN -ODT) 8 MG disintegrating tablet 8mg  ODT q8 hours prn nausea 02/06/24   Palumbo, April, MD  pantoprazole  (PROTONIX ) 40 MG tablet Take 1 tablet (40 mg total) by mouth 2 (two) times daily. Take 30 minutes before breakfast and dinner 01/16/24   [name], [name], NP  phenazopyridine  (PYRIDIUM ) 200 MG tablet Take 1 tablet (200 mg total) by mouth 3 (three) times daily. 02/06/24    Palumbo, April, MD  QUEtiapine  (SEROQUEL ) 50 MG tablet Take 50 mg by mouth at bedtime. 09/09/23   [provider]  rosuvastatin  (CRESTOR ) 10 MG tablet Take 1 tablet (10 mg total) by mouth daily. 11/18/23   Armenta Landau, MD  sucralfate  (CARAFATE ) 1 g tablet Take 1 tablet (1 g total) by mouth 2 (two) times daily. Do not take any other medication within 2-4 hours. 12/25/23   Mansouraty, Albino Alu., MD  tamsulosin  (FLOMAX ) 0.4 MG CAPS capsule Take 1 capsule (0.4 mg total) by mouth daily after supper. 01/06/24   Danford, Willis Harter, MD  TOUJEO  SOLOSTAR 300 UNIT/ML Solostar Pen Inject 25 Units into the skin at bedtime. 12/08/23   Haydee Lipa, MD  traMADol  (ULTRAM ) 50 MG tablet 1 to 2 tablets every 6 hours with extra strength Tylenol  for pain as needed Patient taking differently: Take 50 mg by mouth as needed for moderate pain (pain score 4-6) or severe pain (pain score 7-10). 12/12/23   Wynetta Heckle, MD  traMADol  (ULTRAM ) 50 MG tablet Take 1 tablet (50 mg total) by mouth every 6 (six) hours as needed. 02/06/24   Palumbo, April, MD  traZODone  (DESYREL ) 50 MG tablet Take 50 mg by mouth at bedtime as needed for sleep.    [provider]      Allergies    Codeine    Review of Systems   Review of Systems A 10 point review of systems was performed and is negative unless otherwise reported in HPI.  Physical Exam Updated Vital Signs BP (!) 119/96   Pulse 85   Temp 98 F (36.7 C) (Oral)   Resp 11   LMP 08/11/2012   SpO2 100%  Physical Exam General: Uncomfortable-appearing female, lying in bed.  HEENT: PERRLA, Sclera anicteric, MMM, trachea midline.  Cardiology: RRR, no murmurs/rubs/gallops.  Resp: Normal respiratory rate and effort. CTAB, no wheezes, rhonchi, crackles.  Abd: Soft, diffuse tenderness throughout abdomen including suprapubic region, non-distended. No rebound tenderness or guarding.  GU: Deferred. MSK: No peripheral edema or signs of trauma.  Skin:  warm, dry.  Back:+R CVA TTP Neuro: A&Ox4, CNs II-XII grossly intact. MAEs. Sensation grossly intact.  Psych: Normal mood and affect.   ED Results / Procedures / Treatments   Labs (all labs ordered are listed, but only abnormal results are displayed) Labs Reviewed  LIPASE, BLOOD - Abnormal; Notable for the following components:      Result Value   Lipase 62 (*)    All other components within normal limits  COMPREHENSIVE METABOLIC PANEL WITH GFR - Abnormal; Notable for the following components:   Chloride 97 (*)    Glucose, Bld 157 (*)    All other components within normal limits  CBC - Abnormal; Notable for the following components:   RBC 5.45 (*)    Hemoglobin 15.6 (*)    HCT 47.8 (*)    RDW 15.9 (*)    All other components within  normal limits  URINALYSIS, ROUTINE W REFLEX MICROSCOPIC - Abnormal; Notable for the following components:   APPearance CLOUDY (*)    Specific Gravity, Urine >1.046 (*)    Ketones, ur 5 (*)    Nitrite POSITIVE (*)    Leukocytes,Ua LARGE (*)    Bacteria, UA MANY (*)    All other components within normal limits  URINE CULTURE    EKG EKG Interpretation Date/Time:  Thursday May 28 2024 08:00:06 EDT Ventricular Rate:  99 PR Interval:  147 QRS Duration:  90 QT Interval:  361 QTC Calculation: 464 R Axis:   105  Text Interpretation: Sinus rhythm Right axis deviation Baseline wander in lead(s) V3 Confirmed by Annita Kindle [phone]) on 05/28/2024 8:50:05 AM  Radiology CT ABDOMEN PELVIS W CONTRAST Result Date: 05/28/2024 CLINICAL DATA:  Abdominal pain acute nonlocalized EXAM: CT ABDOMEN AND PELVIS WITHOUT CONTRAST TECHNIQUE: Multidetector CT imaging of the abdomen and pelvis was performed following the standard protocol without IV contrast. RADIATION DOSE REDUCTION: This exam was performed according to the departmental dose-optimization program which includes automated exposure control, adjustment of the mA and/or kV according to patient size and/or use  of iterative reconstruction technique. COMPARISON:  CT abdomen and pelvis February 08, 2024 FINDINGS: Lower chest: No infiltrates or consolidations, no pleural effusions Hepatobiliary: No focal liver abnormality is seen. Status post cholecystectomy. No biliary dilatation. Pancreas: Pancreas normal size. No masses calcifications or inflammatory changes. Spleen: Spleen normal size.  No masses. Adrenals/Urinary Tract: Adrenal glands are normal size. Follow-up recommended. Kidneys are normal. No masses calcifications or hydronephrosis Stomach/Bowel: No small or large bowel obstruction or inflammatory changes. Moderate amount of residual fecal material throughout the colon without obstruction or constipation. Vascular/Lymphatic: No significant vascular findings are present. No enlarged abdominal or pelvic lymph nodes. Reproductive: Status post hysterectomy. No adnexal masses. Other: Anterior abdominal wall unremarkable without evidence of umbilical or inguinal hernias Musculoskeletal: Visualized portion of the thoracolumbar spine and pelvic structures grossly unremarkable without evidence of fracture bony abnormalities or soft tissue masses. IMPRESSION: *Questionable subtle mucosal thickening involving the splenic flexure of the colon that could correlate with early colitis. *Moderate amount of residual fecal material throughout the colon without obstruction or constipation. Electronically Signed   By: Fredrich Jefferson M.D.   On: 05/28/2024 10:26    Procedures Procedures    Medications Ordered in ED Medications  ondansetron  (ZOFRAN ) injection 4 mg (4 mg Intravenous Given 05/28/24 0823)  pantoprazole  (PROTONIX ) injection 40 mg (40 mg Intravenous Given 05/28/24 0922)  lactated ringers  bolus 1,000 mL (1,000 mLs Intravenous New Bag/Given 05/28/24 0924)  HYDROmorphone  (DILAUDID ) injection 1 mg (1 mg Intravenous Given 05/28/24 0923)  iohexol  (OMNIPAQUE ) 300 MG/ML solution 100 mL (100 mLs Intravenous Contrast Given 05/28/24  0933)  HYDROmorphone  (DILAUDID ) injection 0.5 mg (0.5 mg Intravenous Given 05/28/24 1327)  metoCLOPramide  (REGLAN ) injection 10 mg (10 mg Intravenous Given 05/28/24 1327)    ED Course/ Medical Decision Making/ A&P                          Medical Decision Making Amount and/or Complexity of Data Reviewed Labs: ordered. Decision-making details documented in ED Course. Radiology: ordered. Decision-making details documented in ED Course.  Risk Prescription drug management. Decision regarding hospitalization.    This patient presents to the ED for concern of abd pain, nausea/vomiting, , this involves an extensive number of treatment options, and is a complaint that carries with it a high risk of complications and morbidity.  I considered the following differential and admission for this acute, potentially life threatening condition.   MDM:    For DDX for abdominal pain includes but is not limited to:  Abdominal exam without peritoneal signs. No evidence of acute abdomen at this time.Considered pancreatitis (though lipase only mildly elevated), gastritis/PUD, duodenitis, UTI/cystitis/pyelonephritis. Her CT abd pelvis shows colitis though she's had no significant diarrhea.   Clinical Course as of 05/28/24 1332  Thu May 28, 2024  0914 Lipase(!): 62 Mildly elevated lipase, consider pancreatitis [HN]  1144 CT ABDOMEN PELVIS W CONTRAST *Questionable subtle mucosal thickening involving the splenic flexure of the colon that could correlate with early colitis. *Moderate amount of residual fecal material throughout the colon without obstruction or constipation.   [HN]  1144 Ddx includes early pancreatitis, colitis, or gastritis/duodenitis. Patient still with 7/10 pain in abdomen. Awaiting UA as well.  [HN]  1226 Patient with continued 7/10 pain and will consult to hospitalist. Also was given protonix . [HN]    Clinical Course User Index [HN] Merdis Stalling, MD    Labs: I Ordered, and  personally interpreted labs.  The pertinent results include:  those listed above  Imaging Studies ordered: I ordered imaging studies including CT abd pelvis I independently visualized and interpreted imaging. I agree with the radiologist interpretation  Additional history obtained from chart review.  Cardiac Monitoring: The patient was maintained on a cardiac monitor.  I personally viewed and interpreted the cardiac monitored which showed an underlying rhythm of: NSR  Reevaluation: After the interventions noted above, I reevaluated the patient and found that they have :stayed the same  Social Determinants of Health: Lives independently  Disposition:  Admitted to Dr. Jannette Mend with medicine  Co morbidities that complicate the patient evaluation  Past Medical History:  Diagnosis Date   Allergy    seasonal   Anxiety    B12 deficiency    Chest pain 04/05/2017   CHF (congestive heart failure) ([name])    Diabetes mellitus type 1, uncontrolled    dr. 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) 10/29/2013   Leg cramping    @ night (09/18/2013)   Migraine    once q couple months (11/26/2018)   Osteoporosis    Peripheral neuropathy    Umbilical hernia    unrepaired (09/18/2013)     Medicines Meds ordered this encounter  Medications   ondansetron  (ZOFRAN ) injection 4 mg   pantoprazole  (PROTONIX ) injection 40 mg   lactated ringers  bolus 1,000 mL   HYDROmorphone  (DILAUDID ) injection 1 mg   iohexol  (OMNIPAQUE ) 300 MG/ML solution 100 mL   HYDROmorphone  (DILAUDID ) injection 0.5 mg   metoCLOPramide  (REGLAN ) injection 10 mg   cefTRIAXone  (ROCEPHIN ) 1 g in sodium chloride  0.9 % 100 mL IVPB    Antibiotic Indication::    UTI   insulin  aspart (novoLOG ) injection 0-15 Units    Correction coverage::   Moderate (average weight, post-op)    CBG < 70::   Implement Hypoglycemia Standing Orders and refer to Hypoglycemia Standing Orders sidebar report    CBG 70 - 120::   0 units    CBG 121 - 150::   2 units    CBG 151 - 200::   3 units  CBG 201 - 250::   5 units    CBG 251 - 300::   8 units    CBG 301 - 350::   11 units    CBG 351 - 400::   15 units    CBG > 400:   call MD and obtain STAT lab verification   insulin  aspart (novoLOG ) injection 0-5 Units    Correction coverage::   HS scale    CBG < 70::   Implement Hypoglycemia Standing Orders and refer to Hypoglycemia Standing Orders sidebar report    CBG 70 - 120::   0 units    CBG 121 - 150::   0 units    CBG 151 - 200::   0 units    CBG 201 - 250::   2 units    CBG 251 - 300::   3 units    CBG 301 - 350::   4 units    CBG 351 - 400::   5 units    CBG > 400:   call MD and obtain STAT lab verification   enoxaparin  (LOVENOX ) injection 40 mg   OR Linked Order Group    acetaminophen  (TYLENOL ) tablet 650 mg    acetaminophen  (TYLENOL ) suppository 650 mg   oxyCODONE  (Oxy IR/ROXICODONE ) immediate release tablet 5 mg    Refill:  0   OR Linked Order Group    ondansetron  (ZOFRAN ) tablet 4 mg    ondansetron  (ZOFRAN ) injection 4 mg   albuterol  (PROVENTIL ) (2.5 MG/3ML) 0.083% nebulizer solution 2.5 mg   pantoprazole  (PROTONIX ) injection 40 mg   sucralfate  (CARAFATE ) tablet 1 g    Do not take any other medication within 2-4 hours.      I have reviewed the patients home medicines and have made adjustments as needed  Problem List / ED Course: Problem List Items Addressed This Visit       Digestive   Nausea and vomiting     Genitourinary   * (Principal) Pyelonephritis   Relevant Medications   cefTRIAXone  (ROCEPHIN ) 1 g in sodium chloride  0.9 % 100 mL IVPB (Start on 05/28/2024  2:00 PM)   Other Visit Diagnoses       Epigastric pain    -  Primary                    This note was created using dictation software, which may contain spelling or grammatical errors.    Merdis Stalling, MD 05/28/24 1335

## 2024-05-28 NOTE — H&P (Signed)
 History and Physical  CATALEIA GADE XBJ:[id number] DOB: 09-12-1961 DOA: 05/28/2024  PCP: Skeeter Dukes, NP   Chief Complaint: Abdominal and flank pain  HPI: [name] [name] is a 63 y.o. female with medical history significant for chronic abdominal pain, CHF, poorly controlled insulin -dependent diabetes, hypertension, hypothyroidism being admitted to the hospital with recurrent flank pain and nausea likely due to pyelonephritis.  Patient's PCP as well as gastroenterologist are through the [name], she has had multiple ER visits as well as followed up with gastroenterology regarding her chronic abdominal pain.  She has a history of gastritis, esophagitis, recent diagnosis of pancreatitis as well as concern for possible gastroparesis.  She had a recent gastric emptying study in April which was normal.  She continues to have chronic abdominal pain, was recently treated with a 7-day course of Keflex  as an outpatient for UTI.  States that for the last month, she has continued to have persistent dysuria, right-sided flank pain as well as nausea.  This morning about 3:45 AM, she had right-sided flank pain and severe nausea without vomiting.  Denies any fevers or chills.  Endorses continued dysuria.  For these reasons, she came to the ER for evaluation this morning.  Review of Systems: Please see HPI for pertinent positives and negatives. A complete 10 system review of systems are otherwise negative.  Past Medical History:  Diagnosis Date   Allergy    seasonal   Anxiety    B12 deficiency    Chest pain 04/05/2017   CHF (congestive heart failure) ([name])    Diabetes mellitus type 1, uncontrolled    dr. 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) 10/29/2013   Leg cramping    @ night (09/18/2013)   Migraine    once q couple months (11/26/2018)   Osteoporosis    Peripheral neuropathy    Umbilical hernia    unrepaired (09/18/2013)   Past Surgical History:  Procedure Laterality Date   BIOPSY  06/03/2019   Procedure: BIOPSY;  Surgeon: Lajuan Pila, MD;  Location: [facility];  Service: Endoscopy;;   BIOPSY  08/17/2020   Procedure: BIOPSY;  Surgeon: Lindle Rhea, MD;  Location: [facility];  Service: Gastroenterology;;   BIOPSY  02/05/2023   Procedure: BIOPSY;  Surgeon: Asencion Blacksmith, MD;  Location: [facility];  Service: Gastroenterology;;   BIOPSY  09/24/2023   Procedure: BIOPSY;  Surgeon: Normie Becton., MD;  Location: [facility];  Service: Gastroenterology;;   CESAREAN SECTION  1989   ENTEROSCOPY N/A 06/03/2019   Procedure: ENTEROSCOPY;  Surgeon: Lajuan Pila, MD;  Location: [facility];  Service: Endoscopy;  Laterality: N/A;   ESOPHAGOGASTRODUODENOSCOPY (EGD) WITH PROPOFOL  N/A 08/17/2020   Procedure: ESOPHAGOGASTRODUODENOSCOPY (EGD) WITH PROPOFOL ;  Surgeon: Lindle Rhea, MD;  Location: [facility];  Service: Gastroenterology;  Laterality: N/A;   ESOPHAGOGASTRODUODENOSCOPY (EGD) WITH PROPOFOL  N/A 02/05/2023   Procedure: ESOPHAGOGASTRODUODENOSCOPY (EGD) WITH PROPOFOL ;  Surgeon: Asencion Blacksmith, MD;  Location: [facility];  Service: Gastroenterology;  Laterality: N/A;   ESOPHAGOGASTRODUODENOSCOPY (EGD) WITH PROPOFOL  N/A 09/24/2023   Procedure: ESOPHAGOGASTRODUODENOSCOPY (EGD) WITH PROPOFOL ;  Surgeon: Brice Campi Albino Alu., MD;  Location: [facility];  Service: Gastroenterology;  Laterality: N/A;   EUS N/A 09/24/2023   Procedure:  UPPER ENDOSCOPIC ULTRASOUND (EUS) LINEAR;  Surgeon: Brice Campi Albino Alu., MD;  Location: [facility];  Service: Gastroenterology;  Laterality: N/A;   LAPAROSCOPIC ASSISTED VAGINAL HYSTERECTOMY  09/23/2012   Procedure: LAPAROSCOPIC ASSISTED VAGINAL HYSTERECTOMY;   Surgeon: Dyanna Glasgow, DO;  Location: [facility];  Service: Gynecology;  Laterality: N/A;  pull Dr Lorrine Rotunda instrument   LEFT HEART CATH AND CORONARY ANGIOGRAPHY N/A 02/11/2017   Procedure: Left Heart Cath and Coronary Angiography;  Surgeon: Avanell Leigh, MD;  Location: [facility] INVASIVE CV LAB;  Service: Cardiovascular;  Laterality: N/A;   TUBAL LIGATION Bilateral 1992   Social History:  reports that she quit smoking about 33 years ago. Her smoking use included cigarettes. She started smoking about 43 years ago. She has a 1.2 pack-year smoking history. She has never used smokeless tobacco. She reports that she does not currently use drugs after having used the following drugs: Marijuana. She reports that she does not drink alcohol.  Allergies  Allergen Reactions   Codeine Hives, Anxiety and Other (See Comments)    Family History  Problem Relation Age of Onset   Heart failure Mother 49       Her heart skipped a beat and stopped - per pt   Heart failure Brother    Diabetes Brother    Diabetes Maternal Grandmother    Alzheimer's disease Maternal Grandmother    Coronary artery disease Maternal Grandmother    Heart attack Maternal Grandfather    Diabetes Other    Heart disease Other    Colon polyps Neg Hx    Esophageal cancer Neg Hx    Liver cancer Neg Hx    Pancreatic cancer Neg Hx    Stomach cancer Neg Hx    Crohn's disease Neg Hx    Rectal cancer Neg Hx    Colon cancer Neg Hx      Prior to Admission medications   Medication Sig Start Date End Date Taking? Authorizing Provider  aspirin  EC 81 MG tablet Take 81 mg by mouth daily. Swallow whole.    [provider]  azelastine  (ASTELIN ) 0.1 % nasal spray Place 1 spray into both nostrils as needed for rhinitis or allergies. 11/04/23   [provider]  Continuous Blood Gluc Transmit (DEXCOM G6 TRANSMITTER) MISC USE AS DIRECTED FOR CONTINUOUS GLUCOSE MONITORING. REUSE TRANSMITTER X 90DAYS THEN DISCARD & REPLACE 07/05/21   Bedsole,  Amy E, MD  escitalopram  (LEXAPRO ) 10 MG tablet Take 1 tablet (10 mg total) by mouth daily. 11/18/23   Armenta Landau, MD  Glucagon, rDNA, (GLUCAGON EMERGENCY) 1 MG KIT Inject 1 mg into the skin See admin instructions. Inject one mg into the skin see administration instructions. Follow package directions for low blood sugar. 06/18/23   [provider]  levothyroxine  (SYNTHROID ) 137 MCG tablet Take 137 mcg by mouth daily before breakfast. 03/19/22   [provider]  metoCLOPramide  (REGLAN ) 5 MG tablet Take 1 tablet (5 mg total) by mouth 3 (three) times daily before meals. 11/12/23   Danford, Willis Harter, MD  NOVOLOG  FLEXPEN 100 UNIT/ML FlexPen Inject 10 Units into the skin 3 (three) times daily with meals. Plus sliding scale TID 12/08/23   Haydee Lipa, MD  omega-3 acid ethyl esters (LOVAZA ) 1 g capsule Take 2 capsules (2 g total) by mouth 2 (two) times daily. 09/13/23   Barbee Lew, MD  ondansetron  (ZOFRAN ) 4 MG tablet Take 2 tablets (8 mg total) by mouth every 8 (eight) hours as needed for nausea or  vomiting. 12/25/23   Mansouraty, Albino Alu., MD  ondansetron  (ZOFRAN -ODT) 8 MG disintegrating tablet Take 1 tablet (8 mg total) by mouth every 8 (eight) hours as needed for nausea or vomiting. 01/10/24   Lind Repine, MD  ondansetron  (ZOFRAN -ODT) 8 MG disintegrating tablet 8mg  ODT q8 hours prn nausea 02/06/24   Palumbo, April, MD  pantoprazole  (PROTONIX ) 40 MG tablet Take 1 tablet (40 mg total) by mouth 2 (two) times daily. Take 30 minutes before breakfast and dinner 01/16/24   [name], [name], NP  phenazopyridine  (PYRIDIUM ) 200 MG tablet Take 1 tablet (200 mg total) by mouth 3 (three) times daily. 02/06/24   Palumbo, April, MD  QUEtiapine  (SEROQUEL ) 50 MG tablet Take 50 mg by mouth at bedtime. 09/09/23   [provider]  rosuvastatin  (CRESTOR ) 10 MG tablet Take 1 tablet (10 mg total) by mouth daily. 11/18/23   Armenta Landau, MD  sucralfate  (CARAFATE ) 1 g  tablet Take 1 tablet (1 g total) by mouth 2 (two) times daily. Do not take any other medication within 2-4 hours. 12/25/23   Mansouraty, Albino Alu., MD  tamsulosin  (FLOMAX ) 0.4 MG CAPS capsule Take 1 capsule (0.4 mg total) by mouth daily after supper. 01/06/24   Danford, Willis Harter, MD  TOUJEO  SOLOSTAR 300 UNIT/ML Solostar Pen Inject 25 Units into the skin at bedtime. 12/08/23   Haydee Lipa, MD  traMADol  (ULTRAM ) 50 MG tablet 1 to 2 tablets every 6 hours with extra strength Tylenol  for pain as needed Patient taking differently: Take 50 mg by mouth as needed for moderate pain (pain score 4-6) or severe pain (pain score 7-10). 12/12/23   Wynetta Heckle, MD  traMADol  (ULTRAM ) 50 MG tablet Take 1 tablet (50 mg total) by mouth every 6 (six) hours as needed. 02/06/24   Palumbo, April, MD  traZODone  (DESYREL ) 50 MG tablet Take 50 mg by mouth at bedtime as needed for sleep.    [provider]    Physical Exam: BP (!) 106/95   Pulse 80   Temp 98 F (36.7 C) (Oral)   Resp 11   LMP 08/11/2012   SpO2 97%  General:  Alert, oriented, calm, in no acute distress, looks comfortable and nontoxic Cardiovascular: RRR, no murmurs or rubs, no peripheral edema  Respiratory: clear to auscultation bilaterally, no wheezes, no crackles  Abdomen: soft, diffusely tender, nondistended, normal bowel tones heard  Skin: dry, no rashes  Musculoskeletal: no joint effusions, normal range of motion  Psychiatric: appropriate affect, normal speech  Neurologic: extraocular muscles intact, clear speech, moving all extremities with intact sensorium         Labs on Admission:  Basic Metabolic Panel: Recent Labs  Lab 05/28/24 0810  NA 138  K 3.7  CL 97*  CO2 28  GLUCOSE 157*  BUN 18  CREATININE 0.80  CALCIUM  9.1   Liver Function Tests: Recent Labs  Lab 05/28/24 0810  AST 18  ALT 15  ALKPHOS 74  BILITOT 0.5  PROT 7.6  ALBUMIN 3.9   Recent Labs  Lab 05/28/24 0810  LIPASE 62*   No results  for input(s): AMMONIA in the last 168 hours. CBC: Recent Labs  Lab 05/28/24 0810  WBC 9.7  HGB 15.6*  HCT 47.8*  MCV 87.7  PLT 352   Cardiac Enzymes: No results for input(s): CKTOTAL, CKMB, CKMBINDEX, TROPONINI in the last 168 hours. BNP (last 3 results) No results for input(s): BNP in the last 8760 hours.  ProBNP (last 3 results) No  results for input(s): PROBNP in the last 8760 hours.  CBG: No results for input(s): GLUCAP in the last 168 hours.  Radiological Exams on Admission: CT ABDOMEN PELVIS W CONTRAST Result Date: 05/28/2024 CLINICAL DATA:  Abdominal pain acute nonlocalized EXAM: CT ABDOMEN AND PELVIS WITHOUT CONTRAST TECHNIQUE: Multidetector CT imaging of the abdomen and pelvis was performed following the standard protocol without IV contrast. RADIATION DOSE REDUCTION: This exam was performed according to the departmental dose-optimization program which includes automated exposure control, adjustment of the mA and/or kV according to patient size and/or use of iterative reconstruction technique. COMPARISON:  CT abdomen and pelvis February 08, 2024 FINDINGS: Lower chest: No infiltrates or consolidations, no pleural effusions Hepatobiliary: No focal liver abnormality is seen. Status post cholecystectomy. No biliary dilatation. Pancreas: Pancreas normal size. No masses calcifications or inflammatory changes. Spleen: Spleen normal size.  No masses. Adrenals/Urinary Tract: Adrenal glands are normal size. Follow-up recommended. Kidneys are normal. No masses calcifications or hydronephrosis Stomach/Bowel: No small or large bowel obstruction or inflammatory changes. Moderate amount of residual fecal material throughout the colon without obstruction or constipation. Vascular/Lymphatic: No significant vascular findings are present. No enlarged abdominal or pelvic lymph nodes. Reproductive: Status post hysterectomy. No adnexal masses. Other: Anterior abdominal wall unremarkable without  evidence of umbilical or inguinal hernias Musculoskeletal: Visualized portion of the thoracolumbar spine and pelvic structures grossly unremarkable without evidence of fracture bony abnormalities or soft tissue masses. IMPRESSION: *Questionable subtle mucosal thickening involving the splenic flexure of the colon that could correlate with early colitis. *Moderate amount of residual fecal material throughout the colon without obstruction or constipation. Electronically Signed   By: Fredrich Jefferson M.D.   On: 05/28/2024 10:26   Assessment/Plan Sangeeta KIMEKA BADOUR is a 63 y.o. female with medical history significant for chronic abdominal pain, CHF, poorly controlled insulin -dependent diabetes, hypertension, hypothyroidism being admitted to the hospital with recurrent flank pain dysuria and nausea likely due to pyelonephritis.  Pyelonephritis-suspected in the setting of continued dysuria, right-sided flank pain, and associated nausea.  No evidence of sepsis.  Was recently treated for UTI with Keflex  x 7 days. -Observation admission -Pain and nausea medication -Follow-up urine culture -Empiric IV Rocephin   Chronic abdominal pain-followed by [facility], etiology unclear though this is a chronic issue and not acutely worse as far as I can tell.  No associated diarrhea, fever, etc.  Recent gastric emptying study normal. -P.o. pain control -IV PPI and sucralfate  twice daily   Poorly controlled diabetes-hemoglobin A1c 11/2023 of 12.1 -Carb modified diet -Update A1c -Continue home basal bolus insulin  dosing, once medications are reconciled -Moderate dose sliding scale insulin   Hypothyroidism-Synthroid   DVT prophylaxis: Lovenox      Code Status: Full Code  Consults called: None  Admission status: Observation  Time spent: 49 minutes  Jonty Morrical Rickey Charm MD [facility] Pager [phone]  If 7PM-7AM, please contact night-coverage [email or web address] Password [email or web address]  05/28/2024, 1:00 PM

## 2024-05-29 ENCOUNTER — Observation Stay (HOSPITAL_COMMUNITY)

## 2024-05-29 ENCOUNTER — Inpatient Hospital Stay (HOSPITAL_COMMUNITY)

## 2024-05-29 DIAGNOSIS — E1065 Type 1 diabetes mellitus with hyperglycemia: Secondary | ICD-10-CM | POA: Diagnosis present

## 2024-05-29 DIAGNOSIS — R112 Nausea with vomiting, unspecified: Secondary | ICD-10-CM | POA: Diagnosis not present

## 2024-05-29 DIAGNOSIS — Z794 Long term (current) use of insulin: Secondary | ICD-10-CM | POA: Diagnosis not present

## 2024-05-29 DIAGNOSIS — Z833 Family history of diabetes mellitus: Secondary | ICD-10-CM | POA: Diagnosis not present

## 2024-05-29 DIAGNOSIS — N182 Chronic kidney disease, stage 2 (mild): Secondary | ICD-10-CM | POA: Diagnosis present

## 2024-05-29 DIAGNOSIS — E039 Hypothyroidism, unspecified: Secondary | ICD-10-CM | POA: Diagnosis present

## 2024-05-29 DIAGNOSIS — R1013 Epigastric pain: Secondary | ICD-10-CM

## 2024-05-29 DIAGNOSIS — K529 Noninfective gastroenteritis and colitis, unspecified: Secondary | ICD-10-CM | POA: Diagnosis present

## 2024-05-29 DIAGNOSIS — N12 Tubulo-interstitial nephritis, not specified as acute or chronic: Secondary | ICD-10-CM | POA: Diagnosis present

## 2024-05-29 DIAGNOSIS — Z7982 Long term (current) use of aspirin: Secondary | ICD-10-CM | POA: Diagnosis not present

## 2024-05-29 DIAGNOSIS — Z79890 Hormone replacement therapy (postmenopausal): Secondary | ICD-10-CM | POA: Diagnosis not present

## 2024-05-29 DIAGNOSIS — E1043 Type 1 diabetes mellitus with diabetic autonomic (poly)neuropathy: Secondary | ICD-10-CM | POA: Diagnosis present

## 2024-05-29 DIAGNOSIS — Z8249 Family history of ischemic heart disease and other diseases of the circulatory system: Secondary | ICD-10-CM | POA: Diagnosis not present

## 2024-05-29 DIAGNOSIS — I130 Hypertensive heart and chronic kidney disease with heart failure and stage 1 through stage 4 chronic kidney disease, or unspecified chronic kidney disease: Secondary | ICD-10-CM | POA: Diagnosis present

## 2024-05-29 DIAGNOSIS — I480 Paroxysmal atrial fibrillation: Secondary | ICD-10-CM | POA: Diagnosis present

## 2024-05-29 DIAGNOSIS — E785 Hyperlipidemia, unspecified: Secondary | ICD-10-CM | POA: Diagnosis present

## 2024-05-29 DIAGNOSIS — Z87891 Personal history of nicotine dependence: Secondary | ICD-10-CM | POA: Diagnosis not present

## 2024-05-29 DIAGNOSIS — G8929 Other chronic pain: Secondary | ICD-10-CM | POA: Diagnosis present

## 2024-05-29 DIAGNOSIS — Z820 Family history of epilepsy and other diseases of the nervous system: Secondary | ICD-10-CM | POA: Diagnosis not present

## 2024-05-29 DIAGNOSIS — Z79899 Other long term (current) drug therapy: Secondary | ICD-10-CM | POA: Diagnosis not present

## 2024-05-29 DIAGNOSIS — Z885 Allergy status to narcotic agent status: Secondary | ICD-10-CM | POA: Diagnosis not present

## 2024-05-29 DIAGNOSIS — E1022 Type 1 diabetes mellitus with diabetic chronic kidney disease: Secondary | ICD-10-CM | POA: Diagnosis present

## 2024-05-29 DIAGNOSIS — I5032 Chronic diastolic (congestive) heart failure: Secondary | ICD-10-CM | POA: Diagnosis present

## 2024-05-29 DIAGNOSIS — K3184 Gastroparesis: Secondary | ICD-10-CM | POA: Diagnosis present

## 2024-05-29 DIAGNOSIS — I2510 Atherosclerotic heart disease of native coronary artery without angina pectoris: Secondary | ICD-10-CM | POA: Diagnosis present

## 2024-05-29 DIAGNOSIS — K219 Gastro-esophageal reflux disease without esophagitis: Secondary | ICD-10-CM | POA: Diagnosis present

## 2024-05-29 LAB — HEMOGLOBIN A1C
Hgb A1c MFr Bld: 8.4 % — ABNORMAL HIGH (ref 4.8–5.6)
Mean Plasma Glucose: 194.38 mg/dL

## 2024-05-29 LAB — RENAL FUNCTION PANEL
Albumin: 3.7 g/dL (ref 3.5–5.0)
Anion gap: 11 (ref 5–15)
BUN: 16 mg/dL (ref 6–20)
CO2: 23 mmol/L (ref 22–32)
Calcium: 8.4 mg/dL — ABNORMAL LOW (ref 8.9–10.3)
Chloride: 97 mmol/L — ABNORMAL LOW (ref 98–111)
Creatinine, Ser: 0.73 mg/dL (ref 0.44–1.00)
GFR, Estimated: >60 mL/min (ref >60)
Glucose, Bld: 234 mg/dL — ABNORMAL HIGH (ref 70–99)
Phosphorus: 3.2 mg/dL (ref 2.5–4.6)
Potassium: 4 mmol/L (ref 3.5–5.1)
Sodium: 131 mmol/L — ABNORMAL LOW (ref 135–145)

## 2024-05-29 LAB — GLUCOSE, CAPILLARY
Glucose-Capillary: 117 mg/dL — ABNORMAL HIGH (ref 70–99)
Glucose-Capillary: 156 mg/dL — ABNORMAL HIGH (ref 70–99)
Glucose-Capillary: 163 mg/dL — ABNORMAL HIGH (ref 70–99)
Glucose-Capillary: 173 mg/dL — ABNORMAL HIGH (ref 70–99)
Glucose-Capillary: 77 mg/dL (ref 70–99)
Glucose-Capillary: 81 mg/dL (ref 70–99)

## 2024-05-29 LAB — CBC
HCT: 42.4 % (ref 36.0–46.0)
Hemoglobin: 13.4 g/dL (ref 12.0–15.0)
MCH: 28.3 pg (ref 26.0–34.0)
MCHC: 31.6 g/dL (ref 30.0–36.0)
MCV: 89.6 fL (ref 80.0–100.0)
Platelets: 274 10*3/uL (ref 150–400)
RBC: 4.73 MIL/uL (ref 3.87–5.11)
RDW: 15.5 % (ref 11.5–15.5)
WBC: 7.5 10*3/uL (ref 4.0–10.5)
nRBC: 0 % (ref 0.0–0.2)

## 2024-05-29 LAB — TSH: TSH: 37.611 u[IU]/mL — ABNORMAL HIGH (ref 0.350–4.500)

## 2024-05-29 LAB — T4, FREE: Free T4: 1 ng/dL (ref 0.61–1.12)

## 2024-05-29 MED ORDER — PROCHLORPERAZINE EDISYLATE 10 MG/2ML IJ SOLN
10.0000 mg | Freq: Once | INTRAMUSCULAR | Status: AC
  Administered 2024-05-29: 10 mg via INTRAVENOUS
  Filled 2024-05-29: qty 2

## 2024-05-29 MED ORDER — INSULIN GLARGINE-YFGN 100 UNIT/ML ~~LOC~~ SOLN
20.0000 [IU] | Freq: Every day | SUBCUTANEOUS | Status: DC
  Administered 2024-05-30: 20 [IU] via SUBCUTANEOUS
  Filled 2024-05-29 (×3): qty 0.2

## 2024-05-29 MED ORDER — METRONIDAZOLE 500 MG/100ML IV SOLN
500.0000 mg | Freq: Two times a day (BID) | INTRAVENOUS | Status: DC
  Administered 2024-05-29 – 2024-05-31 (×5): 500 mg via INTRAVENOUS
  Filled 2024-05-29 (×5): qty 100

## 2024-05-29 MED ORDER — FENTANYL CITRATE PF 50 MCG/ML IJ SOSY
25.0000 ug | PREFILLED_SYRINGE | INTRAMUSCULAR | Status: DC | PRN
  Administered 2024-05-29 (×2): 25 ug via INTRAVENOUS
  Filled 2024-05-29 (×2): qty 1

## 2024-05-29 MED ORDER — ESCITALOPRAM OXALATE 10 MG PO TABS
10.0000 mg | ORAL_TABLET | Freq: Every day | ORAL | Status: DC
  Administered 2024-05-29 – 2024-05-31 (×3): 10 mg via ORAL
  Filled 2024-05-29 (×3): qty 1

## 2024-05-29 MED ORDER — INSULIN GLARGINE (1 UNIT DIAL) 300 UNIT/ML ~~LOC~~ SOPN: 25.0000 [IU] | PEN_INJECTOR | Freq: Every day | SUBCUTANEOUS | Status: DC

## 2024-05-29 MED ORDER — METOCLOPRAMIDE HCL 5 MG PO TABS
5.0000 mg | ORAL_TABLET | Freq: Three times a day (TID) | ORAL | Status: DC
  Administered 2024-05-29 (×2): 5 mg via ORAL
  Filled 2024-05-29 (×2): qty 1

## 2024-05-29 MED ORDER — INSULIN GLARGINE-YFGN 100 UNIT/ML ~~LOC~~ SOLN: 25.0000 [IU] | Freq: Every day | SUBCUTANEOUS | Status: DC

## 2024-05-29 MED ORDER — LEVOTHYROXINE SODIUM 25 MCG PO TABS
137.0000 ug | ORAL_TABLET | Freq: Every day | ORAL | Status: DC
  Administered 2024-05-29: 137 ug via ORAL
  Filled 2024-05-29: qty 1

## 2024-05-29 MED ORDER — PANTOPRAZOLE SODIUM 40 MG PO TBEC
40.0000 mg | DELAYED_RELEASE_TABLET | Freq: Every day | ORAL | Status: DC
  Administered 2024-05-29 – 2024-05-30 (×2): 40 mg via ORAL
  Filled 2024-05-29 (×2): qty 1

## 2024-05-29 MED ORDER — LEVOTHYROXINE SODIUM 75 MCG PO TABS
150.0000 ug | ORAL_TABLET | Freq: Every day | ORAL | Status: DC
  Administered 2024-05-30 – 2024-05-31 (×2): 150 ug via ORAL
  Filled 2024-05-29 (×2): qty 6

## 2024-05-29 MED ORDER — HYDRALAZINE HCL 20 MG/ML IJ SOLN: 10.0000 mg | Freq: Four times a day (QID) | INTRAMUSCULAR | Status: DC | PRN

## 2024-05-29 MED ORDER — GABAPENTIN 100 MG PO CAPS
100.0000 mg | ORAL_CAPSULE | Freq: Every day | ORAL | Status: DC
  Administered 2024-05-29 – 2024-05-30 (×2): 100 mg via ORAL
  Filled 2024-05-29 (×2): qty 1

## 2024-05-29 MED ORDER — HYDROMORPHONE HCL 1 MG/ML IJ SOLN
1.0000 mg | INTRAMUSCULAR | Status: DC | PRN
  Administered 2024-05-29 – 2024-05-31 (×9): 1 mg via INTRAVENOUS
  Filled 2024-05-29 (×9): qty 1

## 2024-05-29 MED ORDER — METOCLOPRAMIDE HCL 5 MG/ML IJ SOLN
10.0000 mg | Freq: Three times a day (TID) | INTRAMUSCULAR | Status: DC
  Administered 2024-05-29 – 2024-05-31 (×6): 10 mg via INTRAVENOUS
  Filled 2024-05-29 (×6): qty 2

## 2024-05-29 NOTE — Progress Notes (Signed)
 [facility]                                                                              [name] [name], is a 63 y.o. female, DOB - [birth year], XBJ:[id number] Admit date - 05/28/2024    Outpatient Primary MD for the patient is Skeeter Dukes, NP  LOS - 0  days  Chief Complaint  Patient presents with   Abdominal Pain       Brief summary   Patient is a 63 year old female with chronic abdominal pain, CHF, poorly controlled IDDM, HTN, hypothyroidism presented to ED with recurrent flank pain and nausea.  Patient has had multiple ER visits, follow-up with gastroenterology regarding chronic abdominal pain.  She has a history of gastritis, esophagitis, recent diagnosis of pancreatitis and concern for possible gastroparesis, recent gastric emptying study in April was normal.  However she continues to have chronic abdominal pain and was recently treated with 7 days course of Keflex  outpatient for UTI.  States for the last month she has continued to have persistent dysuria, right-sided flank pain and nausea. In ED, UA positive for UTI  Assessment & Plan    Principal Problem: UTI with right-sided pyelonephritis -Recently treated with Keflex  outpatient, UA positive for UTI - CT abdomen and pelvis showed subtle mucosal thickening involving splenic flexure of the colon that could correlate with early colitis, residual fecal material throughout the colon - Continue IV Rocephin , follow urine culture and sensitivities - Needs outpatient urology follow-up appointment for recurrent cystitis/pyonephritis, may need further workup including cystoscopy   Active Problems: Acute colitis -Does have abdominal pain, no nausea vomiting or diarrhea - Continue Rocephin ,  added IV Flagyl  - Currently no acute fevers or diarrhea  Chronic abdominal pain - Followed by [facility], etiology unclear, recent gastric emptying study normal - Will treat for pyelonephritis and colitis - Continue pain  control, PPI, sucralfate     Uncontrolled type 1 diabetes mellitus with hyperglycemia, with long-term current use of insulin  ([name]) with hyperglycemia Obtain hemoglobin A1c -Started on Semglee  20 units daily, moderate sliding scale insulin  CBG (last 3)  Recent Labs    05/28/24 1640 05/28/24 2150 05/29/24 0732  GLUCAP 118* 153* 117*     Hypothyroidism - TSH now 37.6 obtain free T4.   TSH was 16 on 01/05/2024 - Increase levothyroxine  to 150 mcg p.o. 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   Weight as of this encounter: 54.4 kg.  Code Status: Full code DVT Prophylaxis:  enoxaparin  (LOVENOX ) injection 40 mg Start: 05/28/24 1800   Level of Care: Level of care: Med-Surg Family Communication: Updated patient Disposition Plan:      Remains inpatient appropriate: Hopefully  DC home in next 24 to 48 hours pending culture results   Procedures:  None  Consultants:   None  Antimicrobials:   Anti-infectives (From admission, onward)    Start     Dose/Rate Route Frequency Ordered Stop   05/29/24 1000  metroNIDAZOLE  (FLAGYL ) IVPB 500 mg        500 mg 100 mL/hr over 60 Minutes Intravenous Every 12 hours 05/29/24 0811     05/28/24 1400  cefTRIAXone  (ROCEPHIN ) 1 g in sodium chloride  0.9 % 100 mL IVPB        1 g 200 mL/hr over 30 Minutes Intravenous Every 24 hours 05/28/24 1257            Medications  enoxaparin  (LOVENOX ) injection  40 mg Subcutaneous Q24H   escitalopram   10 mg Oral Daily   gabapentin   100  mg Oral QHS   insulin  aspart  0-15 Units Subcutaneous TID WC   insulin  aspart  0-5 Units Subcutaneous QHS   insulin  glargine-yfgn  20 Units Subcutaneous QHS   levothyroxine   137 mcg Oral Q0600   metoCLOPramide   5 mg Oral TID AC   pantoprazole   40 mg Oral QHS   sucralfate   1 g Oral BID      Subjective:   [name] [name] was seen and examined today.  Complaining of right flank pain, back pain, abdominal pain.  No nausea vomiting, fever or chills.  No chest pain or shortness of breath.    Objective:   Vitals:   05/28/24 1805 05/28/24 2242 05/29/24 0246 05/29/24 0530  BP: 94/66 (!) 92/57 116/82 121/85  Pulse: 83 78 81 86  Resp: 18 18 18 18   Temp: 97.8 F (36.6 C) 98.5 F (36.9 C) 98.4 F (36.9 C) 98.6 F (37 C)  TempSrc: Oral Oral Oral Oral  SpO2: 98% 96% 96% 98%  Weight:      Height:        Intake/Output Summary (Last 24 hours) at 05/29/2024 1120 Last data filed at 05/29/2024 1000 Gross per 24 hour  Intake 1061.15 ml  Output --  Net 1061.15 ml     Wt Readings from Last 3 Encounters:  05/28/24 54.4 kg  02/08/24 55.2 kg  01/22/24 55.1 kg     Exam General: Alert and oriented x 3, NAD Cardiovascular: S1 S2 auscultated,  RRR Respiratory: Clear to auscultation bilaterally, no wheezing, rales or rhonchi Gastrointestinal: Soft, mild diffuse TTP, right CVAT, low back pain and L4-L5 region  Ext: no pedal edema bilaterally Neuro: no new deficits Psych: Normal affect     Data Reviewed:  I have personally reviewed following labs    CBC Lab Results  Component Value Date   WBC 7.5 05/29/2024   RBC 4.73 05/29/2024   HGB 13.4 05/29/2024   HCT 42.4 05/29/2024   MCV 89.6 05/29/2024   MCH 28.3 05/29/2024   PLT 274 05/29/2024   MCHC 31.6 05/29/2024   RDW 15.5 05/29/2024   LYMPHSABS 1.7 02/08/2024   MONOABS 0.5 02/08/2024   EOSABS 0.1 02/08/2024   BASOSABS 0.1 02/08/2024     Last metabolic panel Lab Results  Component Value Date   NA 131 (L) 05/29/2024   K 4.0  05/29/2024   CL 97 (L) 05/29/2024   CO2 23 05/29/2024   BUN 16 05/29/2024   CREATININE 0.73 05/29/2024   GLUCOSE 234 (H) 05/29/2024   GFRNONAA >60 05/29/2024   GFRAA >60 08/19/2020   CALCIUM  8.4 (L) 05/29/2024   PHOS 3.2 05/29/2024   PROT  7.6 05/28/2024   ALBUMIN 3.7 05/29/2024   BILITOT 0.5 05/28/2024   ALKPHOS 74 05/28/2024   AST 18 05/28/2024   ALT 15 05/28/2024   ANIONGAP 11 05/29/2024    CBG (last 3)  Recent Labs    05/28/24 1640 05/28/24 2150 05/29/24 0732  GLUCAP 118* 153* 117*      Coagulation Profile: No results for input(s): INR, PROTIME in the last 168 hours.   Radiology Studies: I have personally reviewed the imaging studies  CT ABDOMEN PELVIS W CONTRAST Result Date: 05/28/2024 CLINICAL DATA:  Abdominal pain acute nonlocalized EXAM: CT ABDOMEN AND PELVIS WITHOUT CONTRAST TECHNIQUE: Multidetector CT imaging of the abdomen and pelvis was performed following the standard protocol without IV contrast. RADIATION DOSE REDUCTION: This exam was performed according to the departmental dose-optimization program which includes automated exposure control, adjustment of the mA and/or kV according to patient size and/or use of iterative reconstruction technique. COMPARISON:  CT abdomen and pelvis February 08, 2024 FINDINGS: Lower chest: No infiltrates or consolidations, no pleural effusions Hepatobiliary: No focal liver abnormality is seen. Status post cholecystectomy. No biliary dilatation. Pancreas: Pancreas normal size. No masses calcifications or inflammatory changes. Spleen: Spleen normal size.  No masses. Adrenals/Urinary Tract: Adrenal glands are normal size. Follow-up recommended. Kidneys are normal. No masses calcifications or hydronephrosis Stomach/Bowel: No small or large bowel obstruction or inflammatory changes. Moderate amount of residual fecal material throughout the colon without obstruction or constipation. Vascular/Lymphatic: No significant vascular findings are  present. No enlarged abdominal or pelvic lymph nodes. Reproductive: Status post hysterectomy. No adnexal masses. Other: Anterior abdominal wall unremarkable without evidence of umbilical or inguinal hernias Musculoskeletal: Visualized portion of the thoracolumbar spine and pelvic structures grossly unremarkable without evidence of fracture bony abnormalities or soft tissue masses. IMPRESSION: *Questionable subtle mucosal thickening involving the splenic flexure of the colon that could correlate with early colitis. *Moderate amount of residual fecal material throughout the colon without obstruction or constipation. Electronically Signed   By: Fredrich Jefferson M.D.   On: 05/28/2024 10:26       Leylah Tarnow M.D. [facility] 05/29/2024, 11:20 AM  Available via Epic secure chat 7am-7pm After 7 pm, please refer to night coverage provider listed on amion.

## 2024-05-29 NOTE — Plan of Care (Signed)

## 2024-05-29 NOTE — Plan of Care (Signed)

## 2024-05-30 DIAGNOSIS — N12 Tubulo-interstitial nephritis, not specified as acute or chronic: Secondary | ICD-10-CM | POA: Diagnosis not present

## 2024-05-30 DIAGNOSIS — R112 Nausea with vomiting, unspecified: Secondary | ICD-10-CM | POA: Diagnosis not present

## 2024-05-30 DIAGNOSIS — R1013 Epigastric pain: Secondary | ICD-10-CM | POA: Diagnosis not present

## 2024-05-30 LAB — CBC
HCT: 44.7 % (ref 36.0–46.0)
Hemoglobin: 14.4 g/dL (ref 12.0–15.0)
MCH: 28.5 pg (ref 26.0–34.0)
MCHC: 32.2 g/dL (ref 30.0–36.0)
MCV: 88.3 fL (ref 80.0–100.0)
Platelets: 317 10*3/uL (ref 150–400)
RBC: 5.06 MIL/uL (ref 3.87–5.11)
RDW: 15.2 % (ref 11.5–15.5)
WBC: 9.2 10*3/uL (ref 4.0–10.5)
nRBC: 0 % (ref 0.0–0.2)

## 2024-05-30 LAB — RENAL FUNCTION PANEL
Albumin: 3.6 g/dL (ref 3.5–5.0)
Anion gap: 13 (ref 5–15)
BUN: 19 mg/dL (ref 6–20)
CO2: 23 mmol/L (ref 22–32)
Calcium: 8.7 mg/dL — ABNORMAL LOW (ref 8.9–10.3)
Chloride: 96 mmol/L — ABNORMAL LOW (ref 98–111)
Creatinine, Ser: 1.02 mg/dL — ABNORMAL HIGH (ref 0.44–1.00)
GFR, Estimated: >60 mL/min (ref >60)
Glucose, Bld: 114 mg/dL — ABNORMAL HIGH (ref 70–99)
Phosphorus: 3.7 mg/dL (ref 2.5–4.6)
Potassium: 4.2 mmol/L (ref 3.5–5.1)
Sodium: 132 mmol/L — ABNORMAL LOW (ref 135–145)

## 2024-05-30 LAB — URINE CULTURE: Culture: >=100000 — AB

## 2024-05-30 LAB — T3: T3, Total: 93 ng/dL (ref 71–180)

## 2024-05-30 LAB — GLUCOSE, CAPILLARY
Glucose-Capillary: 116 mg/dL — ABNORMAL HIGH (ref 70–99)
Glucose-Capillary: 111 mg/dL — ABNORMAL HIGH (ref 70–99)
Glucose-Capillary: 172 mg/dL — ABNORMAL HIGH (ref 70–99)
Glucose-Capillary: 120 mg/dL — ABNORMAL HIGH (ref 70–99)
Glucose-Capillary: 143 mg/dL — ABNORMAL HIGH (ref 70–99)

## 2024-05-30 MED ORDER — SODIUM CHLORIDE 0.9 % IV SOLN
2.0000 g | INTRAVENOUS | Status: DC
  Administered 2024-05-30 – 2024-05-31 (×2): 2 g via INTRAVENOUS
  Filled 2024-05-30 (×2): qty 20

## 2024-05-30 MED ORDER — VANCOMYCIN HCL 750 MG/150ML IV SOLN
750.0000 mg | INTRAVENOUS | Status: DC
  Administered 2024-05-31: 750 mg via INTRAVENOUS
  Filled 2024-05-30: qty 150

## 2024-05-30 MED ORDER — ENSURE PLUS HIGH PROTEIN PO LIQD
237.0000 mL | Freq: Two times a day (BID) | ORAL | Status: DC
  Administered 2024-05-30 – 2024-05-31 (×2): 237 mL via ORAL

## 2024-05-30 MED ORDER — VANCOMYCIN HCL 1250 MG/250ML IV SOLN
1250.0000 mg | Freq: Once | INTRAVENOUS | Status: AC
  Administered 2024-05-30: 1250 mg via INTRAVENOUS
  Filled 2024-05-30: qty 250

## 2024-05-30 NOTE — Plan of Care (Signed)

## 2024-05-30 NOTE — Plan of Care (Signed)
   Problem: Education: Goal: Ability to describe self-care measures that may prevent or decrease complications (Diabetes Survival Skills Education) will improve Outcome: Progressing Goal: Individualized Educational Video(s) Outcome: Progressing   Problem: Coping: Goal: Ability to adjust to condition or change in health will improve Outcome: Progressing

## 2024-05-30 NOTE — Progress Notes (Signed)
 [facility]                                                                              [name] [name], is a 63 y.o. female, DOB - 12-25-1960, FMW:[id number] Admit date - 05/28/2024    Outpatient Primary MD for the patient is Arlyss Eugene BRAVO, NP  LOS - 1  days  Chief Complaint  Patient presents with   Abdominal Pain       Brief summary   Patient is a 63 year old female with chronic abdominal pain, CHF, poorly controlled IDDM, HTN, hypothyroidism presented to ED with recurrent flank pain and nausea.  Patient has had multiple ER visits, follow-up with gastroenterology regarding chronic abdominal pain.  She has a history of gastritis, esophagitis, recent diagnosis of pancreatitis and concern for possible gastroparesis, recent gastric emptying study in April was normal.  However she continues to have chronic abdominal pain and was recently treated with 7 days course of Keflex  outpatient for UTI.  States for the last month she has continued to have persistent dysuria, right-sided flank pain and nausea. In ED, UA positive for UTI  Assessment & Plan    Principal Problem: UTI with right-sided pyelonephritis -Recently treated with Keflex  outpatient, UA positive for UTI - CT abdomen and pelvis showed subtle mucosal thickening involving splenic flexure of the colon that could correlate with early colitis, residual fecal material throughout the colon - Urine culture showed more than 100,000 colonies of staph epidermidis, placed on vancomycin  per sensitivities.   - Needs outpatient urology follow-up appointment for recurrent cystitis/pyonephritis, may need further workup including cystoscopy   Active Problems: Acute colitis -Does have abdominal pain, no nausea vomiting or diarrhea - Continue IV Rocephin , Flagyl  - Currently no acute fevers or diarrhea  Chronic abdominal pain - Followed by [facility], etiology unclear, recent gastric emptying study normal - Will treat for  pyelonephritis and colitis - Continue pain control, PPI, sucralfate     Uncontrolled type 1 diabetes mellitus with hyperglycemia, with long-term current use of insulin  ([name]) with hyperglycemia  Hemoglobin A1c 8.4 CBG (last 3)  Recent Labs    05/30/24 0501 05/30/24 0724 05/30/24 1137  GLUCAP 116* 111* 172*   Continue moderate SSI, Semglee  20 units daily  Hypothyroidism - TSH now 37.6 T4 1.0, T3 93 .   TSH was 16 on 01/05/2024 - Increased levothyroxine  to 150 mcg p.o. 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   Weight as of this encounter: 54.4 kg.  Code Status: Full code DVT Prophylaxis:  enoxaparin  (LOVENOX ) injection 40 mg Start: 05/28/24 1800   Level of Care: Level of care: Med-Surg Family Communication: Updated patient Disposition Plan:  Remains inpatient appropriate: Hopefully DC home in next 24 to 48 hours pending culture results   Procedures:  None  Consultants:   None  Antimicrobials:   Anti-infectives (From admission, onward)    Start     Dose/Rate Route Frequency Ordered Stop   05/31/24 1300  vancomycin  (VANCOREADY) IVPB 750 mg/150 mL        750 mg 150 mL/hr over 60 Minutes Intravenous Every 24 hours 05/30/24 1206     05/30/24 1400  cefTRIAXone  (ROCEPHIN ) 2 g in sodium chloride  0.9 % 100 mL IVPB        2 g 200 mL/hr over 30 Minutes Intravenous Every 24 hours 05/30/24 1326     05/30/24 1300  vancomycin  (VANCOREADY) IVPB 1250 mg/250 mL        1,250 mg 166.7 mL/hr over 90 Minutes  Intravenous  Once 05/30/24 1206     05/29/24 1000  metroNIDAZOLE  (FLAGYL ) IVPB 500 mg        500 mg 100 mL/hr over 60 Minutes Intravenous Every 12 hours 05/29/24 0811     05/28/24 1400  cefTRIAXone  (ROCEPHIN ) 1 g in sodium chloride  0.9 % 100 mL IVPB  Status:  Discontinued        1 g 200 mL/hr over 30 Minutes Intravenous Every 24 hours 05/28/24 1257 05/30/24 1326          Medications  enoxaparin  (LOVENOX ) injection  40 mg Subcutaneous Q24H   escitalopram   10 mg Oral Daily   feeding supplement  237 mL Oral BID BM   gabapentin   100 mg Oral QHS   insulin  aspart  0-15 Units Subcutaneous TID WC   insulin  aspart  0-5 Units Subcutaneous QHS   insulin  glargine-yfgn  20 Units Subcutaneous QHS   levothyroxine   150 mcg Oral QAC breakfast   metoCLOPramide  (REGLAN ) injection  10 mg Intravenous Q8H   pantoprazole   40 mg Oral QHS   sucralfate   1 g Oral BID      Subjective:   [name] [name] was seen and examined today.  Continues to have abdominal pain however she does have chronic abdominal pain, complaining of right flank pain, no nausea vomiting, fevers or chills.  Appears comfortable in bed. Objective:   Vitals:   05/29/24 1404 05/29/24 1452 05/29/24 2100 05/30/24 0433  BP: (!) 159/109 127/79 (!) 87/64 115/75  Pulse: 98 88 79 76  Resp:   18 18  Temp: 97.6 F (36.4 C)  98 F (36.7 C) 98.1 F (36.7 C)  TempSrc: Oral  Oral Oral  SpO2: 99%  93% 97%  Weight:      Height:        Intake/Output Summary (Last 24 hours) at 05/30/2024 1347 Last data filed at 05/29/2024 1500 Gross per 24 hour  Intake 200 ml  Output --  Net 200 ml     Wt Readings from Last 3 Encounters:  05/28/24 54.4 kg  02/08/24 55.2 kg  01/22/24 55.1 kg   Physical Exam General: Alert and oriented x 3, NAD Cardiovascular: S1 S2 clear, RRR.  Respiratory: CTAB, no wheezing, rales or rhonchi Gastrointestinal: Soft, right CVAT, ND, NBS  Ext: no pedal edema bilaterally Neuro: no new deficits Psych: Normal affect      Data Reviewed:  I have personally reviewed following labs    CBC Lab Results  Component Value Date   WBC 9.2 05/30/2024   RBC 5.06 05/30/2024   HGB 14.4 05/30/2024   HCT 44.7 05/30/2024   MCV 88.3 05/30/2024   MCH  28.5 05/30/2024   PLT 317 05/30/2024   MCHC 32.2 05/30/2024   RDW 15.2 05/30/2024   LYMPHSABS 1.7 02/08/2024   MONOABS 0.5 02/08/2024   EOSABS 0.1 02/08/2024   BASOSABS 0.1 02/08/2024     Last metabolic panel Lab Results  Component Value Date   NA 132 (L) 05/30/2024   K 4.2 05/30/2024   CL 96 (L) 05/30/2024   CO2 23 05/30/2024   BUN 19 05/30/2024   CREATININE 1.02 (H) 05/30/2024   GLUCOSE 114 (H) 05/30/2024   GFRNONAA >60 05/30/2024   GFRAA >60 08/19/2020   CALCIUM  8.7 (L) 05/30/2024   PHOS 3.7 05/30/2024   PROT 7.6 05/28/2024   ALBUMIN 3.6 05/30/2024   BILITOT 0.5 05/28/2024   ALKPHOS 74 05/28/2024   AST 18 05/28/2024   ALT 15 05/28/2024   ANIONGAP 13 05/30/2024    CBG (last 3)  Recent Labs    05/30/24 0501 05/30/24 0724 05/30/24 1137  GLUCAP 116* 111* 172*      Coagulation Profile: No results for input(s): INR, PROTIME in the last 168 hours.   Radiology Studies: I have personally reviewed the imaging studies  DG Abd Portable 2V Result Date: 05/29/2024 CLINICAL DATA:  Pain and vomiting EXAM: PORTABLE ABDOMEN - 2 VIEW COMPARISON:  Abdominal series 01/10/2024 FINDINGS: Generator overlies the left mid abdomen. The bowel gas pattern is normal. There is no evidence of free air. No radio-opaque calculi or other significant radiographic abnormality is seen. IMPRESSION: Negative. Electronically Signed   By: Greig Pique M.D.   On: 05/29/2024 18:24   DG Lumbar Spine 2-3 Views Result Date: 05/29/2024 CLINICAL DATA:  65452 Low back pain 34547, inpatient EXAM: LUMBAR SPINE - 2-3 VIEW COMPARISON:  05/28/2024 CT abdomen/pelvis and 08/31/2007 lumbar spine radiographs FINDINGS: This report assumes 5 non rib-bearing lumbar vertebrae. There is a  transitional L5 level. The T12 level demonstrates diminutive ribs. Lumbar vertebral body heights are preserved, with no fracture. Mild multilevel lumbar degenerative disc disease, most prominent at L3-4. Minimal 2 mm retrolisthesis L4-5. No significant facet arthropathy. No aggressive appearing focal osseous lesions. IMPRESSION: 1. Transitional L5 level. 2. Mild multilevel lumbar degenerative disc disease, most prominent at L3-4. 3. Minimal 2 mm retrolisthesis L4-5. Electronically Signed   By: Selinda DELENA Blue M.D.   On: 05/29/2024 13:43       Atreyu Mak M.D. [facility] 05/30/2024, 1:47 PM  Available via Epic secure chat 7am-7pm After 7 pm, please refer to night coverage provider listed on amion.

## 2024-05-30 NOTE — Progress Notes (Signed)
 Pharmacy Antibiotic Note  [name] [name] is a 63 y.o. female who presented to the ED on 05/28/24 with c/o abdominal and flank pain.  Abdominal CT on 05/28/24 showed findings with concern for early colitis.  She is currently on ceftriaxone  and flagyl  for acute colitis and pyelonephritis. Ucx collected on 05/28/24 resulted back with >100K staph epi (oxacillin resistant).  Pharmacy has been consulted to dose vancomycin  for staph epi UTI.  Plan: - vancomycin  1250 mg IV x1, then 1000 mg q24h for est AUC 513 - Increase ceftriaxone  to 2gm q24h for intra-abdominal infection indication - flagyl  500 mg q12h  ____________________________________________  Height: 4' 11.02 (149.9 cm) Weight: 54.4 kg (119 lb 14.4 oz) IBW/kg (Calculated) : 43.24  Temp (24hrs), Avg:97.9 F (36.6 C), Min:97.6 F (36.4 C), Max:98.1 F (36.7 C)  Recent Labs  Lab 05/28/24 0810 05/29/24 0621 05/29/24 0948 05/30/24 0647  WBC 9.7 7.5  --  9.2  CREATININE 0.80  --  0.73 1.02*    Estimated Creatinine Clearance: 44.2 mL/min (A) (by C-G formula based on SCr of 1.02 mg/dL (H)).    Allergies  Allergen Reactions   Codeine Hives, Anxiety and Other (See Comments)    Thank you for allowing pharmacy to be a part of this patient's care.  [name] [name] 05/30/2024 1:19 PM

## 2024-05-31 DIAGNOSIS — R112 Nausea with vomiting, unspecified: Secondary | ICD-10-CM | POA: Diagnosis not present

## 2024-05-31 DIAGNOSIS — N12 Tubulo-interstitial nephritis, not specified as acute or chronic: Secondary | ICD-10-CM | POA: Diagnosis not present

## 2024-05-31 DIAGNOSIS — R1013 Epigastric pain: Secondary | ICD-10-CM | POA: Diagnosis not present

## 2024-05-31 LAB — CBC
HCT: 45.3 % (ref 36.0–46.0)
Hemoglobin: 14.8 g/dL (ref 12.0–15.0)
MCH: 28.5 pg (ref 26.0–34.0)
MCHC: 32.7 g/dL (ref 30.0–36.0)
MCV: 87.3 fL (ref 80.0–100.0)
Platelets: 320 10*3/uL (ref 150–400)
RBC: 5.19 MIL/uL — ABNORMAL HIGH (ref 3.87–5.11)
RDW: 15.2 % (ref 11.5–15.5)
WBC: 7.5 10*3/uL (ref 4.0–10.5)
nRBC: 0 % (ref 0.0–0.2)

## 2024-05-31 LAB — GLUCOSE, CAPILLARY
Glucose-Capillary: 229 mg/dL — ABNORMAL HIGH (ref 70–99)
Glucose-Capillary: 141 mg/dL — ABNORMAL HIGH (ref 70–99)

## 2024-05-31 LAB — RENAL FUNCTION PANEL
Albumin: 3.5 g/dL (ref 3.5–5.0)
Anion gap: 11 (ref 5–15)
BUN: 18 mg/dL (ref 6–20)
CO2: 21 mmol/L — ABNORMAL LOW (ref 22–32)
Calcium: 8.6 mg/dL — ABNORMAL LOW (ref 8.9–10.3)
Chloride: 100 mmol/L (ref 98–111)
Creatinine, Ser: 0.83 mg/dL (ref 0.44–1.00)
GFR, Estimated: >60 mL/min (ref >60)
Glucose, Bld: 260 mg/dL — ABNORMAL HIGH (ref 70–99)
Phosphorus: 3.2 mg/dL (ref 2.5–4.6)
Potassium: 4.3 mmol/L (ref 3.5–5.1)
Sodium: 132 mmol/L — ABNORMAL LOW (ref 135–145)

## 2024-05-31 MED ORDER — ONDANSETRON HCL 4 MG PO TABS: 8.0000 mg | ORAL_TABLET | Freq: Three times a day (TID) | ORAL | 2 refills | Status: DC | PRN

## 2024-05-31 MED ORDER — METRONIDAZOLE 500 MG PO TABS: 500.0000 mg | ORAL_TABLET | Freq: Two times a day (BID) | ORAL | 0 refills | Status: DC

## 2024-05-31 MED ORDER — OXYCODONE HCL 5 MG PO TABS
5.0000 mg | ORAL_TABLET | Freq: Four times a day (QID) | ORAL | 0 refills | Status: DC | PRN
Start: 2024-05-31 — End: 2024-07-21

## 2024-05-31 MED ORDER — CIPROFLOXACIN HCL 500 MG PO TABS
500.0000 mg | ORAL_TABLET | Freq: Two times a day (BID) | ORAL | 0 refills | Status: DC
Start: 2024-06-01 — End: 2024-06-06

## 2024-05-31 MED ORDER — LEVOTHYROXINE SODIUM 150 MCG PO TABS: 150.0000 ug | ORAL_TABLET | Freq: Every day | ORAL | 1 refills | Status: DC

## 2024-05-31 NOTE — Discharge Summary (Signed)
 Physician Discharge Summary   Patient: [name] [name] MRN: [id number] DOB: [birth year]  Admit date:     05/28/2024  Discharge date: 05/31/24  Discharge Physician: Nydia Distance, MD    PCP: Arlyss Eugene BRAVO, NP   Recommendations at discharge:   Continue ciprofloxacin  500 mg twice daily, Flagyl  500 mg twice daily for 5 more days to complete full course for colitis and UTI/pyelonephritis Recommended outpatient follow-up with urology in 2 to 3 weeks for recurrent UTIs Synthroid  increased to 150 mcg daily, recheck TSH in 4 weeks  Discharge Diagnoses:    Pyelonephritis  Colitis   Uncontrolled type 1 diabetes mellitus with hyperglycemia, with long-term current use of insulin  ([name])   non obstructive CAD (coronary artery disease)   Hypothyroidism   Chronic diastolic HF (heart failure) ([name])   Hyperlipidemia   GERD   Kidney disease, chronic, stage II (GFR 60-89 ml/min)   Paroxysmal A-fib ([name])   Gastroparesis    Hospital Course: Patient is a 63 year old female with chronic abdominal pain, CHF, poorly controlled IDDM, HTN, hypothyroidism presented to ED with recurrent flank pain and nausea.  Patient has had multiple ER visits, follow-up with gastroenterology regarding chronic abdominal pain.  She has a history of gastritis, esophagitis, recent diagnosis of pancreatitis and concern for possible gastroparesis, recent gastric emptying study in April was normal.  However she continues to have chronic abdominal pain and was recently treated with 7 days course of Keflex  outpatient for UTI.  States for the last month she has continued to have persistent dysuria, right-sided flank pain and nausea. In ED, UA positive for UTI    Assessment and Plan:  UTI with right-sided pyelonephritis -Recently treated with Keflex  outpatient, UA positive for UTI - CT abdomen and pelvis showed subtle mucosal thickening involving splenic flexure of the colon that could correlate with early colitis, residual fecal  material throughout the colon - Urine culture showed more than 100,000 colonies of staph epidermidis, placed on vancomycin  per sensitivities.   - Needs outpatient urology follow-up appointment for recurrent cystitis/pyonephritis, may need further workup including cystoscopy.  Transitioned to oral ciprofloxacin  per sensitivities to continue for 5 more days     Acute colitis -Does have abdominal pain, no nausea vomiting or diarrhea - Initially placed on IV Rocephin  and Flagyl  - Currently no acute fevers or diarrhea Improving, tolerating diet, continue oral ciprofloxacin , Flagyl  for 5 more days to complete course.   Chronic abdominal pain - Followed by [facility], etiology unclear, recent gastric emptying study normal - Continue pain control, PPI, sucralfate      Uncontrolled type 1 diabetes mellitus with hyperglycemia, with long-term current use of insulin  ([name]) with hyperglycemia  Hemoglobin A1c 8.4 -Continue outpatient regimen, follow-up with PCP and titrate medications    hypothyroidism - TSH now 37.6 T4 1.0, T3 93 .   TSH was 16 on 01/05/2024 - Increased levothyroxine  to 150 mcg p.o. 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   Weight as of this encounter:  54.4 kg.       Pain control - [place]  Controlled Substance Reporting System database was reviewed. and patient was instructed, not to drive, operate heavy machinery, perform activities at heights, swimming or participation in water activities or provide  baby-sitting services while on Pain, Sleep and Anxiety Medications; until their outpatient Physician has advised to do so again. Also recommended to not to take more than prescribed Pain, Sleep and Anxiety Medications.  Consultants: None Procedures performed: None Disposition: Home Diet recommendation: Carb modified diet  DISCHARGE MEDICATION: Allergies as of 05/31/2024       Reactions   Codeine Hives, Anxiety, Other (See Comments)        Medication List     STOP taking these medications    traMADol  50 MG tablet Commonly known as: ULTRAM        TAKE these medications    aspirin  EC 81 MG tablet Take 81 mg by mouth daily. Swallow whole.   azelastine  0.1 % nasal spray Commonly known as: ASTELIN  Place 1 spray into both nostrils as needed for rhinitis or allergies.   Baqsimi One Pack 3 MG/DOSE Powd Generic drug: Glucagon Place 3 mg into the nose as needed (severe low blood sugar).   ciprofloxacin  500 MG tablet Commonly known as: Cipro  Take 1 tablet (500 mg total) by mouth 2 (two) times daily for 5 days. Start taking on: June 01, 2024   Dexcom G7 Sensor Misc 1 each by Does not apply route as directed. Every 10 days   escitalopram  10 MG tablet Commonly known as: LEXAPRO  Take 1 tablet (10 mg total) by mouth daily.   gabapentin  100 MG capsule Commonly known as: NEURONTIN  Take 100 mg by mouth at bedtime.   Glucagon Emergency 1 MG Kit Inject 1 mg into the skin See admin instructions. Inject one mg into the skin see administration instructions. Follow package directions for low blood sugar.   levothyroxine  150 MCG tablet Commonly known as: SYNTHROID  Take 1 tablet (150 mcg total) by mouth daily before breakfast. Start taking on: June 01, 2024 What changed:  medication strength how much to take   metoCLOPramide  5 MG tablet Commonly known as: Reglan  Take 1 tablet (5 mg total) by mouth 3 (three) times daily before meals.   metroNIDAZOLE  500 MG tablet Commonly  known as: FLAGYL  Take 1 tablet (500 mg total) by mouth 2 (two) times daily for 5 days.   NovoLOG  FlexPen 100 UNIT/ML FlexPen Generic drug: insulin  aspart Inject 10 Units into the skin 3 (three) times daily with meals. Plus sliding scale TID   omega-3 acid ethyl esters 1 g capsule Commonly known as: LOVAZA  Take 2 capsules (2 g total) by mouth 2 (two) times daily.   ondansetron  4 MG tablet Commonly known as: ZOFRAN  Take 2 tablets (8 mg total) by mouth every 8 (eight) hours as needed for nausea or vomiting.   oxyCODONE  5 MG immediate release tablet Commonly known as: Oxy IR/ROXICODONE  Take 1 tablet (5 mg total) by mouth every 6 (six) hours as needed for moderate pain (pain score 4-6) or severe pain (pain score 7-10).   pantoprazole  40 MG tablet Commonly known as: Protonix  Take 1 tablet (40 mg total) by mouth 2 (two) times daily. Take 30 minutes before breakfast and dinner   phenazopyridine  200 MG tablet Commonly known as: PYRIDIUM  Take 1 tablet (200 mg total) by mouth 3 (three) times daily.   QUEtiapine  50 MG tablet Commonly known as: SEROQUEL  Take 50 mg by mouth at bedtime.  rosuvastatin  10 MG tablet Commonly known as: CRESTOR  Take 1 tablet (10 mg total) by mouth daily.   sucralfate  1 g tablet Commonly known as: CARAFATE  Take 1 tablet (1 g total) by mouth 2 (two) times daily. Do not take any other medication within 2-4 hours.   tamsulosin  0.4 MG Caps capsule Commonly known as: FLOMAX  Take 1 capsule (0.4 mg total) by mouth daily after supper.   tiZANidine 4 MG tablet Commonly known as: ZANAFLEX Take 4 mg by mouth 3 (three) times daily.   Toujeo  SoloStar 300 UNIT/ML Solostar Pen Generic drug: insulin  glargine (1 Unit Dial ) Inject 25 Units into the skin at bedtime.   traZODone  50 MG tablet Commonly known as: DESYREL  Take 50 mg by mouth at bedtime as needed for sleep.        Follow-up Information     Arlyss Eugene BRAVO, NP. Schedule an appointment as soon as  possible for a visit in 2 week(s).   Specialty: Family Medicine Why: for hospital follow-up        [facility]. Schedule an appointment as soon as possible for a visit in 2 week(s).   Why: for hospital follow-up Contact information: [address] [place] [address] [place]  [address] [phone]               Discharge Exam: Fredricka Weights   05/28/24 1440  Weight: 54.4 kg   S: Sitting up in the bed, on the phone.  Appears comfortable, no acute nausea vomiting or abdominal pain.  Cleared for discharge home.  No fevers.  BP 100/79 (BP Location: Right Arm)   Pulse 89   Temp 98.2 F (36.8 C) (Oral)   Resp 14   Ht 4' 11.02 (1.499 m)   Wt 54.4 kg   LMP 08/11/2012   SpO2 97%   BMI 24.20 kg/m   Physical Exam General: Alert and oriented x 3, NAD Cardiovascular: S1 S2 clear, RRR.  Respiratory: CTAB, no wheezing Gastrointestinal: Soft, nontender, nondistended, NBS Ext: no pedal edema bilaterally Neuro: no new deficits Psych: Normal affect    Condition at discharge: fair  The results of significant diagnostics from this hospitalization (including imaging, microbiology, ancillary and laboratory) are listed below for reference.   Imaging Studies: DG Abd Portable 2V Result Date: 05/29/2024 CLINICAL DATA:  Pain and vomiting EXAM: PORTABLE ABDOMEN - 2 VIEW COMPARISON:  Abdominal series 01/10/2024 FINDINGS: Generator overlies the left mid abdomen. The bowel gas pattern is normal. There is no evidence of free air. No radio-opaque calculi or other significant radiographic abnormality is seen. IMPRESSION: Negative. Electronically Signed   By: Greig Pique M.D.   On: 05/29/2024 18:24   DG Lumbar Spine 2-3 Views Result Date: 05/29/2024 CLINICAL DATA:  65452 Low back pain 34547, inpatient EXAM: LUMBAR SPINE - 2-3 VIEW COMPARISON:  05/28/2024 CT abdomen/pelvis and 08/31/2007 lumbar spine radiographs FINDINGS: This report assumes 5 non rib-bearing lumbar  vertebrae. There is a transitional L5 level. The T12 level demonstrates diminutive ribs. Lumbar vertebral body heights are preserved, with no fracture. Mild multilevel lumbar degenerative disc disease, most prominent at L3-4. Minimal 2 mm retrolisthesis L4-5. No significant facet arthropathy. No aggressive appearing focal osseous lesions. IMPRESSION: 1. Transitional L5 level. 2. Mild multilevel lumbar degenerative disc disease, most prominent at L3-4. 3. Minimal 2 mm retrolisthesis L4-5. Electronically Signed   By: Selinda DELENA Blue M.D.   On: 05/29/2024 13:43   CT ABDOMEN PELVIS W CONTRAST Result Date: 05/28/2024 CLINICAL DATA:  Abdominal pain acute  nonlocalized EXAM: CT ABDOMEN AND PELVIS WITHOUT CONTRAST TECHNIQUE: Multidetector CT imaging of the abdomen and pelvis was performed following the standard protocol without IV contrast. RADIATION DOSE REDUCTION: This exam was performed according to the departmental dose-optimization program which includes automated exposure control, adjustment of the mA and/or kV according to patient size and/or use of iterative reconstruction technique. COMPARISON:  CT abdomen and pelvis February 08, 2024 FINDINGS: Lower chest: No infiltrates or consolidations, no pleural effusions Hepatobiliary: No focal liver abnormality is seen. Status post cholecystectomy. No biliary dilatation. Pancreas: Pancreas normal size. No masses calcifications or inflammatory changes. Spleen: Spleen normal size.  No masses. Adrenals/Urinary Tract: Adrenal glands are normal size. Follow-up recommended. Kidneys are normal. No masses calcifications or hydronephrosis Stomach/Bowel: No small or large bowel obstruction or inflammatory changes. Moderate amount of residual fecal material throughout the colon without obstruction or constipation. Vascular/Lymphatic: No significant vascular findings are present. No enlarged abdominal or pelvic lymph nodes. Reproductive: Status post hysterectomy. No adnexal masses. Other:  Anterior abdominal wall unremarkable without evidence of umbilical or inguinal hernias Musculoskeletal: Visualized portion of the thoracolumbar spine and pelvic structures grossly unremarkable without evidence of fracture bony abnormalities or soft tissue masses. IMPRESSION: *Questionable subtle mucosal thickening involving the splenic flexure of the colon that could correlate with early colitis. *Moderate amount of residual fecal material throughout the colon without obstruction or constipation. Electronically Signed   By: Franky Chard M.D.   On: 05/28/2024 10:26    Microbiology: Results for orders placed or performed during the hospital encounter of 05/28/24  Urine Culture     Status: Abnormal   Collection Time: 05/28/24 12:06 PM   Specimen: Urine, Clean Catch  Result Value Ref Range Status   Specimen Description   Final    URINE, CLEAN CATCH Performed at [facility], [address], [place], [place] [address]    Special Requests   Final    NONE Performed at [facility], [address], [place], [place] [address]    Culture >=100,000 COLONIES/mL STAPHYLOCOCCUS EPIDERMIDIS (A)  Final   Report Status 05/30/2024 FINAL  Final   Organism ID, Bacteria STAPHYLOCOCCUS EPIDERMIDIS (A)  Final      Susceptibility   Staphylococcus epidermidis - MIC*    CIPROFLOXACIN  <=0.5 SENSITIVE Sensitive     GENTAMICIN <=0.5 SENSITIVE Sensitive     NITROFURANTOIN  <=16 SENSITIVE Sensitive     OXACILLIN >=4 RESISTANT Resistant     TETRACYCLINE  <=1 SENSITIVE Sensitive     VANCOMYCIN  2 SENSITIVE Sensitive     TRIMETH /SULFA  80 RESISTANT Resistant     RIFAMPIN <=0.5 SENSITIVE Sensitive     Inducible Clindamycin NEGATIVE Sensitive     * >=100,000 COLONIES/mL STAPHYLOCOCCUS EPIDERMIDIS    Labs: CBC: Recent Labs  Lab 05/28/24 0810 05/29/24 0621 05/30/24 0647 05/31/24 0714  WBC 9.7 7.5 9.2 7.5  HGB 15.6* 13.4 14.4 14.8  HCT 47.8* 42.4 44.7 45.3  MCV 87.7 89.6 88.3  87.3  PLT 352 274 317 320   Basic Metabolic Panel: Recent Labs  Lab 05/28/24 0810 05/29/24 0948 05/30/24 0647 05/31/24 0714  NA 138 131* 132* 132*  K 3.7 4.0 4.2 4.3  CL 97* 97* 96* 100  CO2 28 23 23  21*  GLUCOSE 157* 234* 114* 260*  BUN 18 16 19 18   CREATININE 0.80 0.73 1.02* 0.83  CALCIUM  9.1 8.4* 8.7* 8.6*  PHOS  --  3.2 3.7 3.2   Liver Function Tests: Recent Labs  Lab 05/28/24 0810 05/29/24 9051  05/30/24 0647 05/31/24 0714  AST 18  --   --   --   ALT 15  --   --   --   ALKPHOS 74  --   --   --   BILITOT 0.5  --   --   --   PROT 7.6  --   --   --   ALBUMIN 3.9 3.7 3.6 3.5   CBG: Recent Labs  Lab 05/30/24 1137 05/30/24 1618 05/30/24 2013 05/31/24 0731 05/31/24 1143  GLUCAP 172* 120* 143* 229* 141*    Discharge time spent: greater than 30 minutes.  Signed: Nydia Distance, MD [facility] 05/31/2024

## 2024-05-31 NOTE — Progress Notes (Signed)
   05/31/24 1133  TOC Brief Assessment  Insurance and Status Reviewed  Patient has primary care physician Yes  Home environment has been reviewed Home with spouse  Prior level of function: Independent  Prior/Current Home Services No current home services  Social Drivers of Health Review SDOH reviewed no interventions necessary  Readmission risk has been reviewed Yes  Transition of care needs no transition of care needs at this time   Pt screened with no identified SDOH, HH, or DME needs. There are no TOC needs at this time.

## 2024-06-04 ENCOUNTER — Observation Stay (HOSPITAL_COMMUNITY)
Admission: EM | Admit: 2024-06-04 | Discharge: 2024-06-06 | Disposition: A | Discharge status: Home / Self Care | Attending: Emergency Medicine | Admitting: Emergency Medicine

## 2024-06-04 ENCOUNTER — Emergency Department (HOSPITAL_COMMUNITY)

## 2024-06-04 ENCOUNTER — Other Ambulatory Visit: Payer: Self-pay

## 2024-06-04 ENCOUNTER — Encounter (HOSPITAL_COMMUNITY): Payer: Self-pay | Admitting: Emergency Medicine

## 2024-06-04 DIAGNOSIS — Z7901 Long term (current) use of anticoagulants: Secondary | ICD-10-CM | POA: Diagnosis not present

## 2024-06-04 DIAGNOSIS — E0810 Diabetes mellitus due to underlying condition with ketoacidosis without coma: Secondary | ICD-10-CM | POA: Diagnosis not present

## 2024-06-04 DIAGNOSIS — N390 Urinary tract infection, site not specified: Secondary | ICD-10-CM | POA: Insufficient documentation

## 2024-06-04 DIAGNOSIS — R1112 Projectile vomiting: Secondary | ICD-10-CM | POA: Diagnosis not present

## 2024-06-04 DIAGNOSIS — F1220 Cannabis dependence, uncomplicated: Secondary | ICD-10-CM | POA: Insufficient documentation

## 2024-06-04 DIAGNOSIS — Z79899 Other long term (current) drug therapy: Secondary | ICD-10-CM | POA: Insufficient documentation

## 2024-06-04 DIAGNOSIS — E1110 Type 2 diabetes mellitus with ketoacidosis without coma: Secondary | ICD-10-CM | POA: Diagnosis present

## 2024-06-04 DIAGNOSIS — Z87891 Personal history of nicotine dependence: Secondary | ICD-10-CM | POA: Insufficient documentation

## 2024-06-04 DIAGNOSIS — R109 Unspecified abdominal pain: Secondary | ICD-10-CM | POA: Diagnosis not present

## 2024-06-04 DIAGNOSIS — K219 Gastro-esophageal reflux disease without esophagitis: Secondary | ICD-10-CM | POA: Diagnosis not present

## 2024-06-04 DIAGNOSIS — I509 Heart failure, unspecified: Secondary | ICD-10-CM | POA: Diagnosis not present

## 2024-06-04 DIAGNOSIS — I10 Essential (primary) hypertension: Secondary | ICD-10-CM | POA: Diagnosis not present

## 2024-06-04 DIAGNOSIS — E039 Hypothyroidism, unspecified: Secondary | ICD-10-CM | POA: Diagnosis present

## 2024-06-04 DIAGNOSIS — R112 Nausea with vomiting, unspecified: Secondary | ICD-10-CM | POA: Diagnosis present

## 2024-06-04 DIAGNOSIS — E785 Hyperlipidemia, unspecified: Secondary | ICD-10-CM | POA: Diagnosis not present

## 2024-06-04 DIAGNOSIS — F1210 Cannabis abuse, uncomplicated: Secondary | ICD-10-CM

## 2024-06-04 DIAGNOSIS — Z794 Long term (current) use of insulin: Secondary | ICD-10-CM | POA: Insufficient documentation

## 2024-06-04 DIAGNOSIS — G8929 Other chronic pain: Secondary | ICD-10-CM | POA: Diagnosis not present

## 2024-06-04 DIAGNOSIS — I110 Hypertensive heart disease with heart failure: Secondary | ICD-10-CM | POA: Diagnosis not present

## 2024-06-04 DIAGNOSIS — R339 Retention of urine, unspecified: Principal | ICD-10-CM | POA: Diagnosis present

## 2024-06-04 DIAGNOSIS — E1165 Type 2 diabetes mellitus with hyperglycemia: Secondary | ICD-10-CM | POA: Insufficient documentation

## 2024-06-04 DIAGNOSIS — N12 Tubulo-interstitial nephritis, not specified as acute or chronic: Principal | ICD-10-CM

## 2024-06-04 LAB — COMPREHENSIVE METABOLIC PANEL WITH GFR
ALT: 18 U/L (ref 0–44)
AST: 18 U/L (ref 15–41)
Albumin: 4.4 g/dL (ref 3.5–5.0)
Alkaline Phosphatase: 76 U/L (ref 38–126)
Anion gap: 16 — ABNORMAL HIGH (ref 5–15)
BUN: 24 mg/dL — ABNORMAL HIGH (ref 6–20)
CO2: 25 mmol/L (ref 22–32)
Calcium: 9.4 mg/dL (ref 8.9–10.3)
Chloride: 96 mmol/L — ABNORMAL LOW (ref 98–111)
Creatinine, Ser: 0.75 mg/dL (ref 0.44–1.00)
GFR, Estimated: >60 mL/min (ref >60)
Glucose, Bld: 447 mg/dL — ABNORMAL HIGH (ref 70–99)
Potassium: 3.3 mmol/L — ABNORMAL LOW (ref 3.5–5.1)
Sodium: 137 mmol/L (ref 135–145)
Total Bilirubin: 0.7 mg/dL (ref 0.0–1.2)
Total Protein: 7.8 g/dL (ref 6.5–8.1)

## 2024-06-04 LAB — CBC
HCT: 46.6 % — ABNORMAL HIGH (ref 36.0–46.0)
HCT: 42.2 % (ref 36.0–46.0)
Hemoglobin: 15.3 g/dL — ABNORMAL HIGH (ref 12.0–15.0)
Hemoglobin: 13.4 g/dL (ref 12.0–15.0)
MCH: 28.8 pg (ref 26.0–34.0)
MCH: 28.5 pg (ref 26.0–34.0)
MCHC: 32.8 g/dL (ref 30.0–36.0)
MCHC: 31.8 g/dL (ref 30.0–36.0)
MCV: 87.6 fL (ref 80.0–100.0)
MCV: 89.8 fL (ref 80.0–100.0)
Platelets: 387 10*3/uL (ref 150–400)
Platelets: 304 10*3/uL (ref 150–400)
RBC: 5.32 MIL/uL — ABNORMAL HIGH (ref 3.87–5.11)
RBC: 4.7 MIL/uL (ref 3.87–5.11)
RDW: 15.6 % — ABNORMAL HIGH (ref 11.5–15.5)
RDW: 15.7 % — ABNORMAL HIGH (ref 11.5–15.5)
WBC: 13 10*3/uL — ABNORMAL HIGH (ref 4.0–10.5)
WBC: 12 10*3/uL — ABNORMAL HIGH (ref 4.0–10.5)
nRBC: 0 % (ref 0.0–0.2)
nRBC: 0 % (ref 0.0–0.2)

## 2024-06-04 LAB — CBG MONITORING, ED
Glucose-Capillary: 345 mg/dL — ABNORMAL HIGH (ref 70–99)
Glucose-Capillary: 248 mg/dL — ABNORMAL HIGH (ref 70–99)
Glucose-Capillary: 158 mg/dL — ABNORMAL HIGH (ref 70–99)

## 2024-06-04 LAB — URINALYSIS, ROUTINE W REFLEX MICROSCOPIC
Bacteria, UA: NONE SEEN
Bilirubin Urine: NEGATIVE
Glucose, UA: >=500 mg/dL — AB
Hgb urine dipstick: NEGATIVE
Ketones, ur: 20 mg/dL — AB
Leukocytes,Ua: NEGATIVE
Nitrite: NEGATIVE
Protein, ur: 100 mg/dL — AB
Specific Gravity, Urine: 1.025 (ref 1.005–1.030)
pH: 7 (ref 5.0–8.0)

## 2024-06-04 LAB — BASIC METABOLIC PANEL WITH GFR
Anion gap: 15 (ref 5–15)
Anion gap: 11 (ref 5–15)
BUN: 18 mg/dL (ref 6–20)
BUN: 16 mg/dL (ref 6–20)
CO2: 23 mmol/L (ref 22–32)
CO2: 25 mmol/L (ref 22–32)
Calcium: 9 mg/dL (ref 8.9–10.3)
Calcium: 8.7 mg/dL — ABNORMAL LOW (ref 8.9–10.3)
Chloride: 100 mmol/L (ref 98–111)
Chloride: 103 mmol/L (ref 98–111)
Creatinine, Ser: 0.81 mg/dL (ref 0.44–1.00)
Creatinine, Ser: 0.59 mg/dL (ref 0.44–1.00)
GFR, Estimated: >60 mL/min (ref >60)
GFR, Estimated: >60 mL/min (ref >60)
Glucose, Bld: 359 mg/dL — ABNORMAL HIGH (ref 70–99)
Glucose, Bld: 162 mg/dL — ABNORMAL HIGH (ref 70–99)
Potassium: 3.9 mmol/L (ref 3.5–5.1)
Potassium: 4.8 mmol/L (ref 3.5–5.1)
Sodium: 138 mmol/L (ref 135–145)
Sodium: 139 mmol/L (ref 135–145)

## 2024-06-04 LAB — BLOOD GAS, VENOUS
Acid-Base Excess: 3.6 mmol/L — ABNORMAL HIGH (ref 0.0–2.0)
Bicarbonate: 30.9 mmol/L — ABNORMAL HIGH (ref 20.0–28.0)
O2 Saturation: 76 %
Patient temperature: 37
pCO2, Ven: 56 mmHg (ref 44–60)
pH, Ven: 7.35 (ref 7.25–7.43)
pO2, Ven: 46 mmHg — ABNORMAL HIGH (ref 32–45)

## 2024-06-04 LAB — LIPASE, BLOOD: Lipase: 28 U/L (ref 11–51)

## 2024-06-04 LAB — BETA-HYDROXYBUTYRIC ACID: Beta-Hydroxybutyric Acid: 1.38 mmol/L — ABNORMAL HIGH (ref 0.05–0.27)

## 2024-06-04 LAB — GLUCOSE, CAPILLARY: Glucose-Capillary: 152 mg/dL — ABNORMAL HIGH (ref 70–99)

## 2024-06-04 MED ORDER — SODIUM CHLORIDE 0.9 % IV BOLUS
1000.0000 mL | Freq: Once | INTRAVENOUS | Status: AC
  Administered 2024-06-04: 1000 mL via INTRAVENOUS

## 2024-06-04 MED ORDER — ACETAMINOPHEN 650 MG RE SUPP: 650.0000 mg | Freq: Four times a day (QID) | RECTAL | Status: DC | PRN

## 2024-06-04 MED ORDER — INSULIN ASPART 100 UNIT/ML IJ SOLN
0.0000 [IU] | Freq: Three times a day (TID) | INTRAMUSCULAR | Status: DC
  Administered 2024-06-05 (×2): 2 [IU] via SUBCUTANEOUS
  Administered 2024-06-06: 3 [IU] via SUBCUTANEOUS
  Filled 2024-06-04: qty 0.09

## 2024-06-04 MED ORDER — PANTOPRAZOLE SODIUM 40 MG IV SOLR
40.0000 mg | Freq: Two times a day (BID) | INTRAVENOUS | Status: DC
  Administered 2024-06-04 – 2024-06-06 (×4): 40 mg via INTRAVENOUS
  Filled 2024-06-04 (×4): qty 10

## 2024-06-04 MED ORDER — IOHEXOL 300 MG/ML  SOLN
100.0000 mL | Freq: Once | INTRAMUSCULAR | Status: AC | PRN
  Administered 2024-06-04: 100 mL via INTRAVENOUS

## 2024-06-04 MED ORDER — QUETIAPINE FUMARATE 50 MG PO TABS: 50.0000 mg | ORAL_TABLET | Freq: Every day | ORAL | Status: DC

## 2024-06-04 MED ORDER — INSULIN GLARGINE-YFGN 100 UNIT/ML ~~LOC~~ SOLN
10.0000 [IU] | Freq: Every day | SUBCUTANEOUS | Status: DC
  Administered 2024-06-04 – 2024-06-05 (×2): 10 [IU] via SUBCUTANEOUS
  Filled 2024-06-04 (×2): qty 0.1

## 2024-06-04 MED ORDER — LEVOTHYROXINE SODIUM 150 MCG PO TABS
150.0000 ug | ORAL_TABLET | Freq: Every day | ORAL | Status: DC
  Administered 2024-06-05 – 2024-06-06 (×2): 150 ug via ORAL
  Filled 2024-06-04 (×2): qty 2

## 2024-06-04 MED ORDER — ONDANSETRON HCL 4 MG PO TABS: 4.0000 mg | ORAL_TABLET | Freq: Four times a day (QID) | ORAL | Status: DC | PRN

## 2024-06-04 MED ORDER — SODIUM CHLORIDE 0.9 % IV SOLN
12.5000 mg | Freq: Once | INTRAVENOUS | Status: AC
  Administered 2024-06-04: 12.5 mg via INTRAVENOUS
  Filled 2024-06-04: qty 12.5

## 2024-06-04 MED ORDER — ONDANSETRON HCL 4 MG/2ML IJ SOLN
4.0000 mg | Freq: Four times a day (QID) | INTRAMUSCULAR | Status: DC | PRN
  Administered 2024-06-04: 4 mg via INTRAVENOUS
  Filled 2024-06-04: qty 2

## 2024-06-04 MED ORDER — DEXTROSE 50 % IV SOLN: 0.0000 mL | INTRAVENOUS | Status: DC | PRN

## 2024-06-04 MED ORDER — ACETAMINOPHEN 325 MG PO TABS
650.0000 mg | ORAL_TABLET | Freq: Four times a day (QID) | ORAL | Status: DC | PRN
  Administered 2024-06-05 – 2024-06-06 (×2): 650 mg via ORAL
  Filled 2024-06-04 (×2): qty 2

## 2024-06-04 MED ORDER — SODIUM CHLORIDE 0.9 % IV SOLN: INTRAVENOUS | Status: AC

## 2024-06-04 MED ORDER — PANTOPRAZOLE SODIUM 40 MG PO TBEC: 40.0000 mg | DELAYED_RELEASE_TABLET | Freq: Two times a day (BID) | ORAL | Status: DC

## 2024-06-04 MED ORDER — METOCLOPRAMIDE HCL 10 MG PO TABS
5.0000 mg | ORAL_TABLET | Freq: Three times a day (TID) | ORAL | Status: DC
  Administered 2024-06-04 – 2024-06-06 (×5): 5 mg via ORAL
  Filled 2024-06-04 (×5): qty 1

## 2024-06-04 MED ORDER — DEXTROSE IN LACTATED RINGERS 5 % IV SOLN
INTRAVENOUS | Status: DC
  Administered 2024-06-04: 1000 mL via INTRAVENOUS

## 2024-06-04 MED ORDER — ONDANSETRON HCL 4 MG/2ML IJ SOLN
4.0000 mg | Freq: Once | INTRAMUSCULAR | Status: AC
  Administered 2024-06-04: 4 mg via INTRAVENOUS
  Filled 2024-06-04: qty 2

## 2024-06-04 MED ORDER — ENOXAPARIN SODIUM 40 MG/0.4ML IJ SOSY
40.0000 mg | PREFILLED_SYRINGE | INTRAMUSCULAR | Status: DC
  Administered 2024-06-04 – 2024-06-05 (×2): 40 mg via SUBCUTANEOUS
  Filled 2024-06-04 (×2): qty 0.4

## 2024-06-04 MED ORDER — POTASSIUM CHLORIDE CRYS ER 20 MEQ PO TBCR
40.0000 meq | EXTENDED_RELEASE_TABLET | Freq: Once | ORAL | Status: AC
  Administered 2024-06-04: 40 meq via ORAL
  Filled 2024-06-04: qty 2

## 2024-06-04 MED ORDER — HYDROMORPHONE HCL 1 MG/ML IJ SOLN
1.0000 mg | Freq: Once | INTRAMUSCULAR | Status: AC
  Administered 2024-06-04: 1 mg via INTRAVENOUS
  Filled 2024-06-04: qty 1

## 2024-06-04 MED ORDER — POTASSIUM CHLORIDE 10 MEQ/100ML IV SOLN
10.0000 meq | INTRAVENOUS | Status: AC
  Administered 2024-06-04 (×4): 10 meq via INTRAVENOUS
  Filled 2024-06-04 (×4): qty 100

## 2024-06-04 MED ORDER — ALUM & MAG HYDROXIDE-SIMETH 200-200-20 MG/5ML PO SUSP: 30.0000 mL | Freq: Four times a day (QID) | ORAL | Status: DC | PRN

## 2024-06-04 MED ORDER — INSULIN REGULAR(HUMAN) IN NACL 100-0.9 UT/100ML-% IV SOLN
INTRAVENOUS | Status: DC
  Administered 2024-06-04: 8 [IU]/h via INTRAVENOUS
  Filled 2024-06-04: qty 100

## 2024-06-04 MED ORDER — TRAZODONE HCL 100 MG PO TABS: 50.0000 mg | ORAL_TABLET | Freq: Every evening | ORAL | Status: DC | PRN

## 2024-06-04 MED ORDER — HYDROMORPHONE HCL 1 MG/ML IJ SOLN
0.5000 mg | INTRAMUSCULAR | Status: DC | PRN
  Administered 2024-06-04 – 2024-06-05 (×3): 1 mg via INTRAVENOUS
  Filled 2024-06-04 (×3): qty 1

## 2024-06-04 MED ORDER — SUCRALFATE 1 G PO TABS
1.0000 g | ORAL_TABLET | Freq: Two times a day (BID) | ORAL | Status: DC
  Filled 2024-06-04 (×2): qty 1

## 2024-06-04 MED ORDER — LACTATED RINGERS IV BOLUS
20.0000 mL/kg | Freq: Once | INTRAVENOUS | Status: AC
  Administered 2024-06-04: 1088 mL via INTRAVENOUS

## 2024-06-04 MED ORDER — LACTATED RINGERS IV SOLN: INTRAVENOUS | Status: DC

## 2024-06-04 MED ORDER — SODIUM CHLORIDE 0.9 % IV SOLN: 12.5000 mg | Freq: Four times a day (QID) | INTRAVENOUS | Status: DC | PRN

## 2024-06-04 MED ORDER — INSULIN ASPART 100 UNIT/ML IJ SOLN
0.0000 [IU] | Freq: Every day | INTRAMUSCULAR | Status: DC
  Filled 2024-06-04: qty 0.05

## 2024-06-04 NOTE — ED Provider Notes (Signed)
  Physical Exam  BP (!) 167/92 (BP Location: Right Arm)   Pulse (!) 120   Temp 98.2 F (36.8 C) (Oral)   Resp 16   LMP 08/11/2012   SpO2 99%   Physical Exam Vitals and nursing note reviewed.  Constitutional:      Appearance: She is well-developed.  HENT:     Head: Normocephalic and atraumatic.   Cardiovascular:     Rate and Rhythm: Tachycardia present.  Pulmonary:     Effort: Pulmonary effort is normal.     Breath sounds: No wheezing or rales.  Abdominal:     General: Abdomen is flat. Bowel sounds are normal.     Palpations: Abdomen is soft.     Tenderness: There is abdominal tenderness. There is left CVA tenderness.   Skin:    General: Skin is warm and dry.   Neurological:     Mental Status: She is alert.     Procedures  Procedures  ED Course / MDM   Clinical Course as of 06/04/24 1621  Thu Jun 04, 2024  0834 Recently diagnosed with UTI, pain with urination, diarrhea / vomiting since new antibiotics, pain woke her from sleep this morning [CP]  1616 WBC(!): 13.0 [JS]    Clinical Course User Index [CP] Prosperi, Sherlean DEL, PA-C [JS] Cameryn Chrisley, PA-C   Medical Decision Making Amount and/or Complexity of Data Reviewed Labs: ordered. Decision-making details documented in ED Course. Radiology: ordered.  Risk Prescription drug management.  Patient care assumed from Tigard P PA at shift change, please see his note for full HPI.  Patient here with nausea, vomiting, severe abdominal pain.  Recent admission and discharged on 28 May 2021, where she was evaluated and treated for pyelonephritis, sent home on oral antibiotics which she reports she cannot tolerate.  Labs on today's visit do show a new leukocytosis, hemoglobin slightly elevated.  Her blood sugar is 359, she is a poorly controlled diabetic.  UA does have some ketones present, no nitrates or leukocytes. Beta-hydroxy is elevated at 1.38.  CT is otherwise within normal limits.  3:42 PM patient reassessed  by me, continues to have nausea, continues to have pain to her back, with developing white blood cell count, along with intractable nausea vomiting I do feel that patient meets criteria for admission at this time.  Labs are borderline DKA.    4:19 PM spoke to hospitalist service who will admit patient for further management at this time.  She is hemodynamically stable for admission.   Portions of this note were generated with Scientist, clinical (histocompatibility and immunogenetics). Dictation errors may occur despite best attempts at proofreading.         Adamary Savary, PA-C 06/04/24 1621    Dean Clarity, MD 06/04/24 [phone]

## 2024-06-04 NOTE — ED Provider Notes (Signed)
 [place] EMERGENCY DEPARTMENT AT [facility] Provider Note   CSN: [id number] Arrival date & time: 06/04/24  9280     Patient presents with: Abdominal Pain   [name] [name] is a 63 y.o. female with a past medical history significant for GERD, hyperlipidemia, diabetes, CHF, IBS who presents with multiple complaints, having severe abdominal pain, nausea, and has been unable to urinate since last night.  Does endorse having previous history of urinary retention.  Rates her pain 10/10.  Denies fever, chills.  Has been treated for UTI recently, reports that she is not tolerating the antibiotics very well.    Abdominal Pain      Prior to Admission medications   Medication Sig Start Date End Date Taking? Authorizing Provider  aspirin  EC 81 MG tablet Take 81 mg by mouth daily. Swallow whole.    [provider]  azelastine  (ASTELIN ) 0.1 % nasal spray Place 1 spray into both nostrils as needed for rhinitis or allergies. 11/04/23   [provider]  BAQSIMI ONE PACK 3 MG/DOSE POWD Place 3 mg into the nose as needed (severe low blood sugar). 01/30/24   [provider]  ciprofloxacin  (CIPRO ) 500 MG tablet Take 1 tablet (500 mg total) by mouth 2 (two) times daily for 5 days. 06/01/24 06/06/24  [name], [name], MD  Continuous Glucose Sensor (DEXCOM G7 SENSOR) MISC 1 each by Does not apply route as directed. Every 10 days 01/30/24   [provider]  escitalopram  (LEXAPRO ) 10 MG tablet Take 1 tablet (10 mg total) by mouth daily. 11/18/23   Sebastian Toribio GAILS, MD  gabapentin  (NEURONTIN ) 100 MG capsule Take 100 mg by mouth at bedtime. 04/28/24   [provider]  Glucagon, rDNA, (GLUCAGON EMERGENCY) 1 MG KIT Inject 1 mg into the skin See admin instructions. Inject one mg into the skin see administration instructions. Follow package directions for low blood sugar. 06/18/23   [provider]  levothyroxine  (SYNTHROID ) 150 MCG tablet Take 1 tablet (150  mcg total) by mouth daily before breakfast. 06/01/24   [name], [name], MD  metoCLOPramide  (REGLAN ) 5 MG tablet Take 1 tablet (5 mg total) by mouth 3 (three) times daily before meals. 11/12/23   Danford, Lonni SQUIBB, MD  metroNIDAZOLE  (FLAGYL ) 500 MG tablet Take 1 tablet (500 mg total) by mouth 2 (two) times daily for 5 days. 05/31/24 06/05/24  [name], [name], MD  NOVOLOG  FLEXPEN 100 UNIT/ML FlexPen Inject 10 Units into the skin 3 (three) times daily with meals. Plus sliding scale TID 12/08/23   [name], MD  omega-3 acid ethyl esters (LOVAZA ) 1 g capsule Take 2 capsules (2 g total) by mouth 2 (two) times daily. 09/13/23   Will Almarie MATSU, MD  ondansetron  (ZOFRAN ) 4 MG tablet Take 2 tablets (8 mg total) by mouth every 8 (eight) hours as needed for nausea or vomiting. 05/31/24   [name], [name], MD  oxyCODONE  (OXY IR/ROXICODONE ) 5 MG immediate release tablet Take 1 tablet (5 mg total) by mouth every 6 (six) hours as needed for moderate pain (pain score 4-6) or severe pain (pain score 7-10). 05/31/24   [name], [name], MD  pantoprazole  (PROTONIX ) 40 MG tablet Take 1 tablet (40 mg total) by mouth 2 (two) times daily. Take 30 minutes before breakfast and dinner 01/16/24   [name], [name], NP  phenazopyridine  (PYRIDIUM ) 200 MG tablet Take 1 tablet (200 mg total) by mouth 3 (three) times daily. 02/06/24   Palumbo, April,  MD  QUEtiapine  (SEROQUEL ) 50 MG tablet Take 50 mg by mouth at bedtime. 09/09/23   [provider]  rosuvastatin  (CRESTOR ) 10 MG tablet Take 1 tablet (10 mg total) by mouth daily. 11/18/23   Sebastian Toribio GAILS, MD  sucralfate  (CARAFATE ) 1 g tablet Take 1 tablet (1 g total) by mouth 2 (two) times daily. Do not take any other medication within 2-4 hours. 12/25/23   Mansouraty, Aloha Raddle., MD  tamsulosin  (FLOMAX ) 0.4 MG CAPS capsule Take 1 capsule (0.4 mg total) by mouth daily after supper. 01/06/24   Danford, Lonni SQUIBB, MD  tiZANidine (ZANAFLEX) 4 MG tablet Take 4 mg  by mouth 3 (three) times daily. 05/27/24 05/27/25  [provider]  TOUJEO  SOLOSTAR 300 UNIT/ML Solostar Pen Inject 25 Units into the skin at bedtime. 12/08/23   [name], MD  traZODone  (DESYREL ) 50 MG tablet Take 50 mg by mouth at bedtime as needed for sleep.    [provider]    Allergies: Codeine    Review of Systems  Gastrointestinal:  Positive for abdominal pain.  All other systems reviewed and are negative.   Updated Vital Signs BP (!) 167/92 (BP Location: Right Arm)   Pulse (!) 120   Temp 98.2 F (36.8 C) (Oral)   Resp 16   LMP 08/11/2012   SpO2 99%   Physical Exam Vitals and nursing note reviewed.  Constitutional:      General: She is not in acute distress.    Appearance: Normal appearance.  HENT:     Head: Normocephalic and atraumatic.   Eyes:     General:        Right eye: No discharge.        Left eye: No discharge.    Cardiovascular:     Rate and Rhythm: Normal rate and regular rhythm.     Heart sounds: No murmur heard.    No friction rub. No gallop.  Pulmonary:     Effort: Pulmonary effort is normal.     Breath sounds: Normal breath sounds.  Abdominal:     General: Bowel sounds are normal.     Palpations: Abdomen is soft.     Comments: Severe tenderness to palpation right lower quadrant, suprapubic region, some guarding, no rebound, rigidity.   Skin:    General: Skin is warm and dry.     Capillary Refill: Capillary refill takes less than 2 seconds.   Neurological:     Mental Status: She is alert and oriented to person, place, and time.   Psychiatric:        Mood and Affect: Mood normal.        Behavior: Behavior normal.     (all labs ordered are listed, but only abnormal results are displayed) Labs Reviewed  CBC - Abnormal; Notable for the following components:      Result Value   WBC 13.0 (*)    RBC 5.32 (*)    Hemoglobin 15.3 (*)    HCT 46.6 (*)    RDW 15.6 (*)    All other components within normal limits   URINALYSIS, ROUTINE W REFLEX MICROSCOPIC - Abnormal; Notable for the following components:   Color, Urine STRAW (*)    Glucose, UA >=500 (*)    Ketones, ur 20 (*)    Protein, ur 100 (*)    All other components within normal limits  COMPREHENSIVE METABOLIC PANEL WITH GFR - Abnormal; Notable for the following components:   Potassium 3.3 (*)  Chloride 96 (*)    Glucose, Bld 447 (*)    BUN 24 (*)    Anion gap 16 (*)    All other components within normal limits  BETA-HYDROXYBUTYRIC ACID - Abnormal; Notable for the following components:   Beta-Hydroxybutyric Acid 1.38 (*)    All other components within normal limits  BLOOD GAS, VENOUS - Abnormal; Notable for the following components:   pO2, Ven 46 (*)    Bicarbonate 30.9 (*)    Acid-Base Excess 3.6 (*)    All other components within normal limits  CBG MONITORING, ED - Abnormal; Notable for the following components:   Glucose-Capillary 345 (*)    All other components within normal limits  LIPASE, BLOOD  BASIC METABOLIC PANEL WITH GFR  BASIC METABOLIC PANEL WITH GFR  BASIC METABOLIC PANEL WITH GFR  BASIC METABOLIC PANEL WITH GFR    EKG: None  Radiology: CT ABDOMEN PELVIS W CONTRAST Result Date: 06/04/2024 CLINICAL DATA:  Acute abdominal pain and nausea.  Dysuria. EXAM: CT ABDOMEN AND PELVIS WITH CONTRAST TECHNIQUE: Multidetector CT imaging of the abdomen and pelvis was performed using the standard protocol following bolus administration of intravenous contrast. RADIATION DOSE REDUCTION: This exam was performed according to the departmental dose-optimization program which includes automated exposure control, adjustment of the mA and/or kV according to patient size and/or use of iterative reconstruction technique. CONTRAST:  OMNIPAQUE  IOHEXOL  300 MG/ML  SOLN COMPARISON:  05/28/2024 FINDINGS: Lower Chest: No acute findings. Hepatobiliary: No suspicious hepatic masses identified. Mild diffuse hepatic steatosis. Prior  cholecystectomy. No evidence of biliary obstruction. Pancreas:  No mass or inflammatory changes. Spleen: Within normal limits in size and appearance. Adrenals/Urinary Tract: No suspicious masses identified. No evidence of ureteral calculi or hydronephrosis. Foley catheter seen in bladder. Stomach/Bowel: No evidence of obstruction, inflammatory process or abnormal fluid collections. Normal appendix visualized. Diffuse colonic diverticulosis again noted, without signs of diverticulitis. Vascular/Lymphatic: No pathologically enlarged lymph nodes. No acute vascular findings. Reproductive: Prior hysterectomy noted. Adnexal regions are unremarkable in appearance. Other:  None. Musculoskeletal:  No suspicious bone lesions identified. IMPRESSION: No acute findings. Colonic diverticulosis, without radiographic evidence of diverticulitis. Mild hepatic steatosis. Electronically Signed   By: Norleen DELENA Kil M.D.   On: 06/04/2024 13:55     .Critical Care  Performed by: Rosan Sherlean DEL, PA-C Authorized by: Rosan Sherlean DEL, PA-C   Critical care provider statement:    Critical care time (minutes):  35   Critical care was necessary to treat or prevent imminent or life-threatening deterioration of the following conditions:  Metabolic crisis (DKA)   Critical care was time spent personally by me on the following activities:  Development of treatment plan with patient or surrogate, discussions with consultants, evaluation of patient's response to treatment, examination of patient, ordering and review of laboratory studies, ordering and review of radiographic studies, ordering and performing treatments and interventions, pulse oximetry, re-evaluation of patient's condition and review of old charts    Medications Ordered in the ED  insulin  regular, human (MYXREDLIN ) 100 units/ 100 mL infusion (8 Units/hr Intravenous New Bag/Given 06/04/24 1512)  lactated ringers  infusion (0 mLs Intravenous Hold 06/04/24 1500)   dextrose  5 % in lactated ringers  infusion (0 mLs Intravenous Hold 06/04/24 1458)  dextrose  50 % solution 0-50 mL (has no administration in time range)  potassium chloride  10 mEq in 100 mL IVPB (10 mEq Intravenous New Bag/Given 06/04/24 1457)  HYDROmorphone  (DILAUDID ) injection 1 mg (1 mg Intravenous Given 06/04/24 0927)  ondansetron  (ZOFRAN ) injection  4 mg (4 mg Intravenous Given 06/04/24 0927)  iohexol  (OMNIPAQUE ) 300 MG/ML solution 100 mL (100 mLs Intravenous Contrast Given 06/04/24 1223)  promethazine  (PHENERGAN ) 12.5 mg in sodium chloride  0.9 % 50 mL IVPB (0 mg Intravenous Stopped 06/04/24 1349)  sodium chloride  0.9 % bolus 1,000 mL (0 mLs Intravenous Stopped 06/04/24 1348)  lactated ringers  bolus 1,088 mL (1,088 mLs Intravenous New Bag/Given 06/04/24 1453)  HYDROmorphone  (DILAUDID ) injection 1 mg (1 mg Intravenous Given 06/04/24 1458)  potassium chloride  SA (KLOR-CON  M) CR tablet 40 mEq (40 mEq Oral Given 06/04/24 1500)    Clinical Course as of 06/04/24 1519  Thu Jun 04, 2024  0834 Recently diagnosed with UTI, pain with urination, diarrhea / vomiting since new antibiotics, pain woke her from sleep this morning [CP]    Clinical Course User Index [CP] Rosan Sherlean DEL, PA-C                                 Medical Decision Making Amount and/or Complexity of Data Reviewed Labs: ordered. Radiology: ordered.  Risk Prescription drug management.   This patient is a 63 y.o. female  who presents to the ED for concern of abdominal pain, nausea, vomiting.   Differential diagnoses prior to evaluation: The emergent differential diagnosis includes, but is not limited to,  The causes of generalized abdominal pain include but are not limited to AAA, mesenteric ischemia, appendicitis, diverticulitis, DKA, gastritis, gastroenteritis, AMI, nephrolithiasis, pancreatitis, peritonitis, adrenal insufficiency,lead poisoning, iron toxicity, intestinal ischemia, constipation, UTI,SBO/LBO, splenic rupture,  biliary disease, IBD, IBS, PUD, or hepatitis . This is not an exhaustive differential.   Past Medical History / Co-morbidities / Social History: GERD, hyperlipidemia, diabetes, CHF, IBS  Additional history: Chart reviewed. Pertinent results include: Reviewed lab work, imaging from previous emergency department visits  Physical Exam: Physical exam performed. The pertinent findings include:  Severe tenderness to palpation right lower quadrant, suprapubic region, some guarding, no rebound, rigidity.   Tachycardia, pulse 120 on reevaluation. BP elevated at 167/92  Lab Tests/Imaging studies: I personally interpreted labs/imaging and the pertinent results include:  CMP notable for hypokalemia, potassium 3.3, hyperglycemia glucose 447, anion gap noted. Elevated BHB, concerning for developing DKA, but VBG without acidosis. UA with ketones, no evidence of ongoing infection.  Lipase unremarkable, repeat CBG 345. CTAP with diverticulosis no evidence of diverticulitis or other acute intra-abdominal. I agree with the radiologist interpretation.   Medications: I ordered medication including dilaudid , insulin , zofran , phenergan , fluids.  I have reviewed the patients home medicines and have made adjustments as needed.   3:19 PM Care of [name] [name] transferred to [facility] and Dr. Dean at the end of my shift as the patient will require reassessment once labs/imaging have resulted. Patient presentation, ED course, and plan of care discussed with review of all pertinent labs and imaging. Please see his/her note for further details regarding further ED course and disposition. Plan at time of handoff is reassess abdominal pain, and blood sugar after IV insulin .. This may be altered or completely changed at the discretion of the oncoming team pending results of further workup.   Final diagnoses:  None    ED Discharge Orders     None          Rosan Sherlean DEL DEVONNA 06/04/24 1519     Bernard Drivers, MD 06/05/24 1113

## 2024-06-04 NOTE — H&P (Signed)
 History and Physical    [name] [name] FMW:[id number] DOB: January 12, 1961 DOA: 06/04/2024  DOS: the patient was seen and examined on 06/04/2024  PCP: Arlyss Eugene BRAVO, NP   Patient coming from: Home  I have personally briefly reviewed patient's old medical records in [facility]  Chief Complaint: Nausea, vomiting and back pain  HPI: [name] [name] is a pleasant 63 y.o. female with medical history significant for chronic abdominal pain, CHF, poorly controlled diabetes, HTN, hypothyroidism presented to ED with recurrent flank pain, nausea vomiting.  She was recently admitted between 05/28/2024 and 05/31/2024 at [facility] for similar complaints.  She was positive for Staph epidermidis in the urine and she was treated with vancomycin .  Prior to that she was treated with Keflex  as outpatient.  She was also found to have acute colitis for which initially she was treated with ceftriaxone  and Flagyl  later patient was discharged home on Cipro  and Flagyl  for 5 days course.  Patient was supposed to complete the 5 days course today.  However, patient started to have persistent nausea, vomiting and abdominal pain and she was not able to keep anything down since yesterday.  She came into the emergency room where she was found to have leukocytosis at 13, some tachycardia, nausea vomiting, high blood sugar, some ketones positive and hospitalist service was consulted for evaluation for admission for possible DKA.  Patient was seen and examined at bedside in the emergency room.  Patient is still complains of abdominal pain which is 7/10 in intensity, diffuse and also she is pointing towards bilateral flanks.  She also states that she has been nauseated and vomiting and not able to keep anything down since yesterday.  She denies any fever or chills.  Denies any chest pain shortness of breath palpitations.  ED Course: Upon arrival to the ED, patient is found to have some ketones positive, high blood  sugars in 400s and persistent nausea vomiting and abdominal pain.  Hospitalist service was consulted for evaluation for admission for possible DKA.  Review of Systems:  ROS  All other systems negative except as noted in the HPI.  Past Medical History:  Diagnosis Date   Allergy    seasonal   Anxiety    B12 deficiency    Chest pain 04/05/2017   CHF (congestive heart failure) ([name])    Diabetes mellitus type 1, uncontrolled    dr. 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) 10/29/2013   Leg cramping    @ night (09/18/2013)   Migraine    once q couple months (11/26/2018)   Osteoporosis    Peripheral neuropathy    Umbilical hernia    unrepaired (09/18/2013)    Past Surgical History:  Procedure Laterality Date   BIOPSY  06/03/2019   Procedure: BIOPSY;  Surgeon: Charlanne Groom, MD;  Location: [facility];  Service: Endoscopy;;   BIOPSY  08/17/2020   Procedure: BIOPSY;  Surgeon: Eda Iha, MD;  Location: [facility];  Service: Gastroenterology;;   BIOPSY  02/05/2023   Procedure: BIOPSY;  Surgeon: Aneita Gwendlyn DASEN, MD;  Location: [facility];  Service: Gastroenterology;;   BIOPSY  09/24/2023   Procedure: BIOPSY;  Surgeon: Wilhelmenia Aloha Raddle., MD;  Location: [facility];  Service: Gastroenterology;;   CESAREAN SECTION  1989   ENTEROSCOPY N/A 06/03/2019   Procedure: ENTEROSCOPY;  Surgeon: Charlanne Groom, MD;  Location: [facility];  Service: Endoscopy;  Laterality: N/A;   ESOPHAGOGASTRODUODENOSCOPY (EGD) WITH PROPOFOL  N/A 08/17/2020   Procedure: ESOPHAGOGASTRODUODENOSCOPY (EGD) WITH PROPOFOL ;  Surgeon: Eda Iha, MD;  Location: [facility];  Service: Gastroenterology;  Laterality: N/A;    ESOPHAGOGASTRODUODENOSCOPY (EGD) WITH PROPOFOL  N/A 02/05/2023   Procedure: ESOPHAGOGASTRODUODENOSCOPY (EGD) WITH PROPOFOL ;  Surgeon: Aneita Gwendlyn DASEN, MD;  Location: [facility];  Service: Gastroenterology;  Laterality: N/A;   ESOPHAGOGASTRODUODENOSCOPY (EGD) WITH PROPOFOL  N/A 09/24/2023   Procedure: ESOPHAGOGASTRODUODENOSCOPY (EGD) WITH PROPOFOL ;  Surgeon: Wilhelmenia Aloha Raddle., MD;  Location: [facility];  Service: Gastroenterology;  Laterality: N/A;   EUS N/A 09/24/2023   Procedure: UPPER ENDOSCOPIC ULTRASOUND (EUS) LINEAR;  Surgeon: Wilhelmenia Aloha Raddle., MD;  Location: [facility];  Service: Gastroenterology;  Laterality: N/A;   LAPAROSCOPIC ASSISTED VAGINAL HYSTERECTOMY  09/23/2012   Procedure: LAPAROSCOPIC ASSISTED VAGINAL HYSTERECTOMY;  Surgeon: Duwaine Blumenthal, DO;  Location: [facility];  Service: Gynecology;  Laterality: N/A;  pull Dr Jacqulyn instrument   LEFT HEART CATH AND CORONARY ANGIOGRAPHY N/A 02/11/2017   Procedure: Left Heart Cath and Coronary Angiography;  Surgeon: Dorn JINNY Lesches, MD;  Location: [facility] CV [facility];  Service: Cardiovascular;  Laterality: N/A;   TUBAL LIGATION Bilateral 1992     reports that she quit smoking about 33 years ago. Her smoking use included cigarettes. She started smoking about 43 years ago. She has a 1.2 pack-year smoking history. She has never used smokeless tobacco. She reports that she does not currently use drugs after having used the following drugs: Marijuana. She reports that she does not drink alcohol.  Allergies  Allergen Reactions   Codeine Hives, Anxiety and Other (See Comments)    Family History  Problem Relation Age of Onset   Heart failure Mother 36       Her heart skipped a beat and stopped - per pt   Heart failure Brother    Diabetes Brother    Diabetes Maternal Grandmother    Alzheimer's disease Maternal Grandmother    Coronary artery disease Maternal Grandmother    Heart attack Maternal Grandfather    Diabetes Other     Heart disease Other    Colon polyps Neg Hx    Esophageal cancer Neg Hx    Liver cancer Neg Hx    Pancreatic cancer Neg Hx    Stomach cancer Neg Hx    Crohn's disease Neg Hx    Rectal cancer Neg Hx    Colon cancer Neg Hx     Prior to Admission medications   Medication Sig Start Date End Date Taking? Authorizing Provider  aspirin  EC 81 MG tablet Take 81 mg by mouth daily. Swallow whole.    [provider]  azelastine  (ASTELIN ) 0.1 % nasal spray Place 1 spray into both nostrils as needed for rhinitis or allergies. 11/04/23   [provider]  BAQSIMI ONE PACK 3 MG/DOSE POWD Place 3 mg into the nose as needed (severe low blood sugar). 01/30/24   [provider]  ciprofloxacin  (CIPRO ) 500 MG tablet Take 1 tablet (500 mg total) by mouth 2 (two) times daily for 5 days. 06/01/24 06/06/24  [name], [name], MD  Continuous Glucose Sensor (DEXCOM G7 SENSOR) MISC 1 each by Does not apply route as directed. Every 10 days 01/30/24   [provider]  escitalopram  (LEXAPRO ) 10 MG tablet  Take 1 tablet (10 mg total) by mouth daily. 11/18/23   Sebastian Toribio GAILS, MD  gabapentin  (NEURONTIN ) 100 MG capsule Take 100 mg by mouth at bedtime. 04/28/24   [provider]  Glucagon, rDNA, (GLUCAGON EMERGENCY) 1 MG KIT Inject 1 mg into the skin See admin instructions. Inject one mg into the skin see administration instructions. Follow package directions for low blood sugar. 06/18/23   [provider]  levothyroxine  (SYNTHROID ) 150 MCG tablet Take 1 tablet (150 mcg total) by mouth daily before breakfast. 06/01/24   [name], [name], MD  metoCLOPramide  (REGLAN ) 5 MG tablet Take 1 tablet (5 mg total) by mouth 3 (three) times daily before meals. 11/12/23   Danford, Lonni SQUIBB, MD  metroNIDAZOLE  (FLAGYL ) 500 MG tablet Take 1 tablet (500 mg total) by mouth 2 (two) times daily for 5 days. 05/31/24 06/05/24  [name], [name], MD  NOVOLOG  FLEXPEN 100 UNIT/ML FlexPen Inject 10 Units into  the skin 3 (three) times daily with meals. Plus sliding scale TID 12/08/23   [name], MD  omega-3 acid ethyl esters (LOVAZA ) 1 g capsule Take 2 capsules (2 g total) by mouth 2 (two) times daily. 09/13/23   Will Almarie MATSU, MD  ondansetron  (ZOFRAN ) 4 MG tablet Take 2 tablets (8 mg total) by mouth every 8 (eight) hours as needed for nausea or vomiting. 05/31/24   [name], [name], MD  oxyCODONE  (OXY IR/ROXICODONE ) 5 MG immediate release tablet Take 1 tablet (5 mg total) by mouth every 6 (six) hours as needed for moderate pain (pain score 4-6) or severe pain (pain score 7-10). 05/31/24   [name], [name], MD  pantoprazole  (PROTONIX ) 40 MG tablet Take 1 tablet (40 mg total) by mouth 2 (two) times daily. Take 30 minutes before breakfast and dinner 01/16/24   [name], [name], NP  phenazopyridine  (PYRIDIUM ) 200 MG tablet Take 1 tablet (200 mg total) by mouth 3 (three) times daily. 02/06/24   Palumbo, April, MD  QUEtiapine  (SEROQUEL ) 50 MG tablet Take 50 mg by mouth at bedtime. 09/09/23   [provider]  rosuvastatin  (CRESTOR ) 10 MG tablet Take 1 tablet (10 mg total) by mouth daily. 11/18/23   Sebastian Toribio GAILS, MD  sucralfate  (CARAFATE ) 1 g tablet Take 1 tablet (1 g total) by mouth 2 (two) times daily. Do not take any other medication within 2-4 hours. 12/25/23   Mansouraty, Aloha Raddle., MD  tamsulosin  (FLOMAX ) 0.4 MG CAPS capsule Take 1 capsule (0.4 mg total) by mouth daily after supper. 01/06/24   Danford, Lonni SQUIBB, MD  tiZANidine (ZANAFLEX) 4 MG tablet Take 4 mg by mouth 3 (three) times daily. 05/27/24 05/27/25  [provider]  TOUJEO  SOLOSTAR 300 UNIT/ML Solostar Pen Inject 25 Units into the skin at bedtime. 12/08/23   [name], MD  traZODone  (DESYREL ) 50 MG tablet Take 50 mg by mouth at bedtime as needed for sleep.    [provider]    Physical Exam: Vitals:   06/04/24 0724 06/04/24 1133 06/04/24 1455  BP: 139/87 (!) 155/106 (!) 167/92   Pulse: (!) 110 99 (!) 120  Resp: 18 16 16   Temp: 98.2 F (36.8 C) (!) 97.5 F (36.4 C) 98.2 F (36.8 C)  TempSrc: Oral Oral Oral  SpO2: 96% 97% 99%    Physical Exam   Constitutional: Alert, awake, calm, comfortable HEENT: Neck supple Respiratory: Clear to auscultation B/L, no wheezing, no rales.  Cardiovascular: Regular rate and rhythm, no murmurs / rubs /  gallops. No extremity edema. 2+ pedal pulses. No carotid bruits.  Abdomen: Soft, no tenderness, Bowel sounds positive.  Musculoskeletal: no clubbing / cyanosis. Good ROM, no contractures. Normal muscle tone.  Skin: no rashes, lesions, ulcers. Neurologic: CN 2-12 grossly intact. Sensation intact, No focal deficit identified Psychiatric: Alert and oriented x 3. Normal mood.    Labs on Admission: I have personally reviewed following labs and imaging studies  CBC: Recent Labs  Lab 05/29/24 0621 05/30/24 0647 05/31/24 0714 06/04/24 0736  WBC 7.5 9.2 7.5 13.0*  HGB 13.4 14.4 14.8 15.3*  HCT 42.4 44.7 45.3 46.6*  MCV 89.6 88.3 87.3 87.6  PLT 274 317 320 387   Basic Metabolic Panel: Recent Labs  Lab 05/29/24 0948 05/30/24 0647 05/31/24 0714 06/04/24 0958 06/04/24 1421  NA 131* 132* 132* 137 138  K 4.0 4.2 4.3 3.3* 3.9  CL 97* 96* 100 96* 100  CO2 23 23 21* 25 23  GLUCOSE 234* 114* 260* 447* 359*  BUN 16 19 18  24* 18  CREATININE 0.73 1.02* 0.83 0.75 0.81  CALCIUM  8.4* 8.7* 8.6* 9.4 9.0  PHOS 3.2 3.7 3.2  --   --    GFR: Estimated Creatinine Clearance: 55.6 mL/min (by C-G formula based on SCr of 0.81 mg/dL). Liver Function Tests: Recent Labs  Lab 05/29/24 [phone] 05/30/24 0647 05/31/24 0714 06/04/24 0958  AST  --   --   --  18  ALT  --   --   --  18  ALKPHOS  --   --   --  76  BILITOT  --   --   --  0.7  PROT  --   --   --  7.8  ALBUMIN 3.7 3.6 3.5 4.4   Recent Labs  Lab 06/04/24 0958  LIPASE 28   No results for input(s): AMMONIA in the last 168 hours. Coagulation Profile: No results for  input(s): INR, PROTIME in the last 168 hours. Cardiac Enzymes: No results for input(s): CKTOTAL, CKMB, CKMBINDEX, TROPONINI, TROPONINIHS in the last 168 hours. BNP (last 3 results) No results for input(s): BNP in the last 8760 hours. HbA1C: No results for input(s): HGBA1C in the last 72 hours. CBG: Recent Labs  Lab 05/30/24 2013 05/31/24 0731 05/31/24 1143 06/04/24 1507 06/04/24 1609  GLUCAP 143* 229* 141* 345* 248*     Urine analysis:    Component Value Date/Time   COLORURINE STRAW (A) 06/04/2024 1136   APPEARANCEUR CLEAR 06/04/2024 1136   LABSPEC 1.025 06/04/2024 1136   PHURINE 7.0 06/04/2024 1136   GLUCOSEU >=500 (A) 06/04/2024 1136   HGBUR NEGATIVE 06/04/2024 1136   BILIRUBINUR NEGATIVE 06/04/2024 1136   BILIRUBINUR negative 03/09/2021 1123   KETONESUR 20 (A) 06/04/2024 1136   PROTEINUR 100 (A) 06/04/2024 1136   UROBILINOGEN 0.2 03/09/2021 1123   UROBILINOGEN 0.2 10/28/2013 1723   NITRITE NEGATIVE 06/04/2024 1136   LEUKOCYTESUR NEGATIVE 06/04/2024 1136    Radiological Exams on Admission: I have personally reviewed images CT ABDOMEN PELVIS W CONTRAST Result Date: 06/04/2024 CLINICAL DATA:  Acute abdominal pain and nausea.  Dysuria. EXAM: CT ABDOMEN AND PELVIS WITH CONTRAST TECHNIQUE: Multidetector CT imaging of the abdomen and pelvis was performed using the standard protocol following bolus administration of intravenous contrast. RADIATION DOSE REDUCTION: This exam was performed according to the departmental dose-optimization program which includes automated exposure control, adjustment of the mA and/or kV according to patient size and/or use of iterative reconstruction technique. CONTRAST:  OMNIPAQUE  IOHEXOL  300 MG/ML  SOLN  COMPARISON:  05/28/2024 FINDINGS: Lower Chest: No acute findings. Hepatobiliary: No suspicious hepatic masses identified. Mild diffuse hepatic steatosis. Prior cholecystectomy. No evidence of biliary obstruction. Pancreas:  No  mass or inflammatory changes. Spleen: Within normal limits in size and appearance. Adrenals/Urinary Tract: No suspicious masses identified. No evidence of ureteral calculi or hydronephrosis. Foley catheter seen in bladder. Stomach/Bowel: No evidence of obstruction, inflammatory process or abnormal fluid collections. Normal appendix visualized. Diffuse colonic diverticulosis again noted, without signs of diverticulitis. Vascular/Lymphatic: No pathologically enlarged lymph nodes. No acute vascular findings. Reproductive: Prior hysterectomy noted. Adnexal regions are unremarkable in appearance. Other:  None. Musculoskeletal:  No suspicious bone lesions identified. IMPRESSION: No acute findings. Colonic diverticulosis, without radiographic evidence of diverticulitis. Mild hepatic steatosis. Electronically Signed   By: Norleen DELENA Kil M.D.   On: 06/04/2024 13:55    EKG: N/A    Assessment/Plan Active Problems:   Urinary retention   Hypothyroidism   Hyperlipidemia   GERD   Nausea and vomiting   Marijuana abuse   DKA (diabetic ketoacidosis) ([name])     This is a 63 year old female who has multiple medical problems including but not limited to chronic abdominal pain, nausea vomiting and multiple recent admission for the same, marijuana use, HTN, HLD, DM, GERD, history of duodenitis/gastritis, hypothyroidism who was brought into the ED for evaluation of intractable nausea vomiting and abdominal pain.  1.  Intractable nausea vomiting and abdominal pain in the setting of chronic abdominal pain. - Exam does not appear to be revealing. - She will be placed on pain medication, Protonix , sucralfate  and Maalox. - Will continue to monitor pain syndromes. - She was recently treated for colitis and UTI. - I do not believe this was a UTI at this time.  She has a leukocytosis may be related to reactive stress related or due to excessive vomiting.  She does not have fever.  2.  Possible DKA/starvation ketosis -  Patient has diabetes and she uses pump at home. - She was not using pump due to she was not eating since yesterday. - Blood sugars are high due to lack of insulin . - I do not believe it is true DKA. - Will place her on IV fluid, antinausea medication and Lantus  and insulin  sliding scale. - Will continue to monitor blood sugars every 4 hours. - BMP does not show any acidosis. - Will continue to monitor labs and her symptoms and clinical improvement.  3.  Recent treatment of UTI/pyelonephritis - I am not sure if this was real UTI at this time. - She has a history of chronic abdominal pain. - Since she had a recent treatment with antibiotics, I am leaning towards holding off antibiotic and wait for the cultures to come back.  4.  Hypertension: Appears to be stable - Continue to follow blood pressure monitoring  5.:  Acute on chronic abdominal pain/pain syndrome - Continue pain medication  6.  Hypothyroidism continue levothyroxine  150 mcg  7.  Chronic marijuana use - This may be related to cyclical vomiting - Extensive counseling was done      DVT prophylaxis: Lovenox  Code Status: Full Code Family Communication: No family available  Disposition Plan: Home  Consults called: None  Admission status: Observation, Med-Surg   Nena Rebel, MD [facility] 06/04/2024, 5:03 PM

## 2024-06-04 NOTE — ED Triage Notes (Signed)
 Pt BIB [facility] from home with reports of lower abdominal pain and nausea. PT received 4mg  zofran  and 100mcg fentanyl  en route. Pt also reports she has been unable to urinate since last night.

## 2024-06-04 NOTE — ED Notes (Signed)
 ED TO INPATIENT HANDOFF REPORT  Name/Age/Gender [name] [name] 63 y.o. female  Code Status    Code Status Orders  (From admission, onward)           Start     Ordered   06/04/24 1646  Full code  Continuous       Question:  By:  Answer:  Consent: discussion documented in EHR   06/04/24 1648           Code Status History     Date Active Date Inactive Code Status Order ID Comments User Context   05/28/2024 1300 05/31/2024 2105 Full Code [id number]  Zella Katha HERO, MD ED   01/21/2024 2243 01/26/2024 1916 Full Code [id number]  Sim Emery CROME, MD ED   01/11/2024 1849 01/14/2024 1935 Full Code [id number]  Waddell Rake, MD ED   01/05/2024 0809 01/06/2024 2037 Full Code [id number]  Celinda Alm Lot, MD ED   12/07/2023 1330 12/08/2023 1619 Full Code [id number]  Emmy Justus DEL, MD ED   11/14/2023 1125 11/18/2023 1635 Full Code [id number]  Jurline Rockie CROME, NP ED   11/12/2023 0032 11/12/2023 1709 Full Code [id number]  Ricky Alfrieda DASEN, DO ED   10/25/2023 1819 10/28/2023 1434 Full Code [id number]  Moody Alto, MD ED   09/22/2023 0922 09/25/2023 2311 Full Code [id number]  Zella Katha HERO, MD ED   09/12/2023 0359 09/14/2023 1724 Full Code [id number]  Charlton Evalene RAMAN, MD ED   09/02/2023 1551 09/04/2023 2316 Full Code [id number]  Moody Alto, MD ED   08/22/2023 2023 08/25/2023 1740 Full Code [id number]  Tobie Jorie SAUNDERS, MD ED   08/01/2023 0903 08/03/2023 0000 Full Code [id number]  Nelia Dirks, MD ED   06/14/2023 1522 06/17/2023 1341 Full Code [id number]  Zella Katha HERO, MD ED   05/14/2023 1659 05/16/2023 1837 Full Code [id number]  Waddell Rake, MD ED   04/19/2023 1442 04/21/2023 2059 Full Code [id number]  Zella Katha HERO, MD ED   02/02/2023 1659 02/06/2023 1828 Full Code [id number]  Will Almarie MATSU, MD ED   01/06/2023 1921 01/08/2023 1944 Full Code [id number]  Ricky Alfrieda DASEN, DO ED   03/27/2022 1948 03/29/2022 1959 Full Code [id number]  Rockey Denece LABOR, DO Inpatient   01/18/2022 1249 01/19/2022 1533 Full Code [id number]   Laurice Maude JAYSON, MD ED   08/16/2020 1031 08/17/2020 1553 Full Code [id number]  Kriste Emeline BRAVO, MD ED   05/30/2019 0546 06/03/2019 2108 Full Code [id number]  Charlton Evalene RAMAN, MD Inpatient   04/23/2019 1450 04/24/2019 1442 Full Code [id number]  Barbarann Nest, MD ED   03/25/2019 1934 03/28/2019 0011 Full Code [id number]  Rosario Leatrice FERNS, MD Inpatient   03/25/2019 1934 03/25/2019 1934 Full Code [id number]  Rosario Leatrice FERNS, MD Inpatient   01/12/2019 1912 01/14/2019 1642 Full Code [id number]  Madelyne Owen LABOR, MD ED   11/26/2018 1539 11/28/2018 1346 Full Code [id number]  Celinda Alm Lot, MD ED   11/26/2018 1530 11/26/2018 1539 Full Code [id number]  Celinda Alm Lot, MD ED   10/19/2018 1541 10/20/2018 2122 Full Code [id number]  Leopold Damien NOVAK, MD Inpatient   09/22/2018 1645 09/23/2018 1510 Full Code [id number]  Perri LABOR Meliton Mickey., MD Inpatient   09/22/2018 1645 09/22/2018 1645 Full Code [id number]  Perri LABOR Meliton Mickey., MD Inpatient   09/11/2018 1452 09/12/2018 1621 Full Code [id number]  Barbarann Nest, MD ED   08/04/2018 1038 08/05/2018 2012 Full Code [id number]  Netta Darice HERO, NP ED   06/27/2018 1633 06/29/2018 1757 Full Code [id number]  Trixie Nilda HERO, MD ED   05/26/2018 0242 05/28/2018 2108 Full Code [id number]  Franky Redia SAILOR, MD ED   03/06/2018 2107 03/08/2018 1752 Full Code [id number]  Charlton Evalene RAMAN, MD ED   02/02/2018 1129 02/04/2018 1854 Full Code [id number]  Alm Maxwell LABOR, MD ED   12/11/2017 1232 12/14/2017 1658 Full Code [id number]  Dina Camie BRAVO, PA-C ED   09/19/2017 0746 09/23/2017 1924 Full Code [id number]  Dina Camie BRAVO, PA-C ED   09/17/2017 2203 09/18/2017 1940 Full Code [id number]  Claudene Maximino LABOR, MD ED   06/22/2017 1709 06/24/2017 1930 Full Code [id number]  Rojelio Delon Light, DO Inpatient   06/03/2017 1626 06/05/2017 1955 Full Code [id number]  Dina Camie BRAVO, PA-C ED   04/05/2017 1143 04/08/2017 2117 Full Code [id number]  Netta Darice HERO, NP ED   03/22/2017 2120 03/23/2017 1831 Full Code  [id number]  Jonel Lonni SQUIBB, MD Inpatient   02/09/2017 0958 02/11/2017 2302 Full Code [id number]  Alto Isaiah CROME, NP ED   07/24/2016 1633 07/26/2016 2113 Full Code [id number]  Remonia Alm PARAS, MD ED   07/24/2016 1633 07/24/2016 1633 Full Code [id number]  Remonia Alm PARAS, MD ED   10/28/2013 2338 10/29/2013 1749 Full Code [id number]  Michaelene Dancer, MD ED   09/18/2013 1649 09/20/2013 1637 Full Code [id number]  Verdene Candido POUR, MD Inpatient   09/23/2012 1135 09/25/2012 1753 Full Code [id number]  May Wanda SAUNDERS, RN Inpatient       Home/SNF/Other Home  Chief Complaint DKA (diabetic ketoacidosis) ([name]) [E11.10]  Level of Care/Admitting Diagnosis ED Disposition     ED Disposition  Admit   Condition  --   Comment  Hospital Area: [facility] [[id number]]  Level of Care: Med-Surg [16]  May place patient in observation at [facility] or [facility] if equivalent level of care is available:: Yes  Covid Evaluation: Asymptomatic - no recent exposure (last 10 days) testing not required  Diagnosis: DKA (diabetic ketoacidosis) [facility] [[id number]]  Admitting Physician: ROANN GOUTY [[id number]]  Attending Physician: ROANN GOUTY [id number]          Medical History Past Medical History:  Diagnosis Date   Allergy    seasonal   Anxiety    B12 deficiency    Chest pain 04/05/2017   CHF (congestive heart failure) ([name])    Diabetes mellitus type 1, uncontrolled    dr. 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) 10/29/2013   Leg cramping    @ night (09/18/2013)   Migraine    once q couple months (11/26/2018)   Osteoporosis    Peripheral neuropathy    Umbilical hernia    unrepaired (09/18/2013)     Allergies Allergies  Allergen Reactions   Codeine Hives and Anxiety   Ciprofloxacin  Diarrhea, Nausea Only and Other (See Comments)    Caused extreme nausea   Flagyl  [Metronidazole ] Nausea Only and Other (See Comments)    Can tolerate ONLY VIA IV (otherwise, SEVERE NAUSEA)    IV Location/Drains/Wounds Patient Lines/Drains/Airways Status     Active Line/Drains/Airways     Name Placement date Placement time Site Days   Peripheral IV 06/04/24 22 G Anterior;Right Forearm 06/04/24  0606  Forearm  less than 1  Peripheral IV 06/04/24 20 G Left;Posterior Wrist 06/04/24  1458  Wrist  less than 1   Urethral Catheter Edsel Manners, RN Latex 14 Fr. 06/04/24  9141  Latex  less than 1            Labs/Imaging Results for orders placed or performed during the hospital encounter of 06/04/24 (from the past 48 hours)  CBC     Status: Abnormal   Collection Time: 06/04/24  7:36 AM  Result Value Ref Range   WBC 13.0 (H) 4.0 - 10.5 K/uL   RBC 5.32 (H) 3.87 - 5.11 MIL/uL   Hemoglobin 15.3 (H) 12.0 - 15.0 g/dL   HCT 53.3 (H) 63.9 - 53.9 %   MCV 87.6 80.0 - 100.0 fL   MCH 28.8 26.0 - 34.0 pg   MCHC 32.8 30.0 - 36.0 g/dL   RDW 84.3 (H) 88.4 - 84.4 %   Platelets 387 150 - 400 K/uL   nRBC 0.0 0.0 - 0.2 %    Comment: Performed at [facility], [address] [place], [place] [address]  Comprehensive metabolic panel with GFR     Status: Abnormal   Collection Time: 06/04/24  9:58 AM  Result Value Ref Range   Sodium 137 135 - 145 mmol/L   Potassium 3.3 (L) 3.5 - 5.1 mmol/L   Chloride 96 (L) 98 - 111 mmol/L   CO2 25 22 - 32 mmol/L   Glucose, Bld 447 (H) 70 - 99 mg/dL    Comment: Glucose reference range applies only to samples taken after fasting for at least 8 hours.   BUN 24 (H) 6 - 20 mg/dL   Creatinine, Ser 9.24 0.44 - 1.00 mg/dL   Calcium  9.4 8.9 - 10.3 mg/dL   Total Protein 7.8 6.5 - 8.1 g/dL   Albumin 4.4 3.5 - 5.0 g/dL   AST 18 15 - 41 U/L   ALT 18 0 - 44  U/L   Alkaline Phosphatase 76 38 - 126 U/L   Total Bilirubin 0.7 0.0 - 1.2 mg/dL   GFR, Estimated >39 >39 mL/min    Comment: (NOTE) Calculated using the CKD-EPI Creatinine Equation (2021)    Anion gap 16 (H) 5 - 15    Comment: Performed at [facility], [address] [place], [place] [address]  Lipase, blood     Status: None   Collection Time: 06/04/24  9:58 AM  Result Value Ref Range   Lipase 28 11 - 51 U/L    Comment: Performed at [facility], [address], [place], [place] [address]  Urinalysis, Routine w reflex microscopic -Urine, Clean Catch     Status: Abnormal   Collection Time: 06/04/24 11:36 AM  Result Value Ref Range   Color, Urine STRAW (A) YELLOW   APPearance CLEAR CLEAR   Specific Gravity, Urine 1.025 1.005 - 1.030   pH 7.0 5.0 - 8.0   Glucose, UA >=500 (A) NEGATIVE mg/dL   Hgb urine dipstick NEGATIVE NEGATIVE   Bilirubin Urine NEGATIVE NEGATIVE   Ketones, ur 20 (A) NEGATIVE mg/dL   Protein, ur 899 (A) NEGATIVE mg/dL   Nitrite NEGATIVE NEGATIVE   Leukocytes,Ua NEGATIVE NEGATIVE   RBC / HPF 0-5 0 - 5 RBC/hpf   WBC, UA 0-5 0 - 5 WBC/hpf   Bacteria, UA NONE SEEN NONE SEEN   Squamous Epithelial / HPF 0-5 0 - 5 /HPF   Mucus PRESENT     Comment: Performed at [facility], [address], [place], [place] [address]  Beta-hydroxybutyric acid     Status: Abnormal   Collection Time: 06/04/24 12:18 PM  Result Value Ref Range   Beta-Hydroxybutyric Acid 1.38 (H) 0.05 - 0.27 mmol/L    Comment: Performed at [facility], [address] [place], [place] [address]  Blood gas, venous (at [facility])     Status: Abnormal   Collection Time: 06/04/24 12:18 PM  Result Value Ref Range   pH, Ven 7.35 7.25 - 7.43   pCO2, Ven 56 44 - 60 mmHg   pO2, Ven 46 (H) 32 - 45 mmHg   Bicarbonate 30.9 (H) 20.0 - 28.0 mmol/L   Acid-Base Excess 3.6 (H) 0.0 - 2.0 mmol/L   O2 Saturation 76 %   Patient temperature  37.0     Comment: Performed at [facility], [address], [place], [place] [address]  Basic metabolic panel     Status: Abnormal   Collection Time: 06/04/24  2:21 PM  Result Value Ref Range   Sodium 138 135 - 145 mmol/L   Potassium 3.9 3.5 - 5.1 mmol/L   Chloride 100 98 - 111 mmol/L   CO2 23 22 - 32 mmol/L   Glucose, Bld 359 (H) 70 - 99 mg/dL    Comment: Glucose reference range applies only to samples taken after fasting for at least 8 hours.   BUN 18 6 - 20 mg/dL   Creatinine, Ser 9.18 0.44 - 1.00 mg/dL   Calcium  9.0 8.9 - 10.3 mg/dL   GFR, Estimated >39 >39 mL/min    Comment: (NOTE) Calculated using the CKD-EPI Creatinine Equation (2021)    Anion gap 15 5 - 15    Comment: Performed at [facility], [address], [place], [place] [address]  CBG monitoring, ED     Status: Abnormal   Collection Time: 06/04/24  3:07 PM  Result Value Ref Range   Glucose-Capillary 345 (H) 70 - 99 mg/dL    Comment: Glucose reference range applies only to samples taken after fasting for at least 8 hours.   Comment 1 Notify RN   CBG monitoring, ED     Status: Abnormal   Collection Time: 06/04/24  4:09 PM  Result Value Ref Range   Glucose-Capillary 248 (H) 70 - 99 mg/dL    Comment: Glucose reference range applies only to samples taken after fasting for at least 8 hours.   Comment 1 Notify RN    CT ABDOMEN PELVIS W CONTRAST Result Date: 06/04/2024 CLINICAL DATA:  Acute abdominal pain and nausea.  Dysuria. EXAM: CT ABDOMEN AND PELVIS WITH CONTRAST TECHNIQUE: Multidetector CT imaging of the abdomen and pelvis was performed using the standard protocol following bolus administration of intravenous contrast. RADIATION DOSE REDUCTION: This exam was performed according to the departmental dose-optimization program which includes automated exposure control, adjustment of the mA and/or kV according to patient size and/or use of iterative reconstruction technique. CONTRAST:   OMNIPAQUE  IOHEXOL  300 MG/ML  SOLN COMPARISON:  05/28/2024 FINDINGS: Lower Chest: No acute findings. Hepatobiliary: No suspicious hepatic masses identified. Mild diffuse hepatic steatosis. Prior cholecystectomy. No evidence of biliary obstruction. Pancreas:  No mass or inflammatory changes. Spleen: Within normal limits in size and appearance. Adrenals/Urinary Tract: No suspicious masses identified. No evidence of ureteral calculi or hydronephrosis. Foley catheter seen in bladder. Stomach/Bowel: No evidence of obstruction, inflammatory process or abnormal fluid collections. Normal appendix visualized. Diffuse colonic diverticulosis again noted, without signs  of diverticulitis. Vascular/Lymphatic: No pathologically enlarged lymph nodes. No acute vascular findings. Reproductive: Prior hysterectomy noted. Adnexal regions are unremarkable in appearance. Other:  None. Musculoskeletal:  No suspicious bone lesions identified. IMPRESSION: No acute findings. Colonic diverticulosis, without radiographic evidence of diverticulitis. Mild hepatic steatosis. Electronically Signed   By: Norleen DELENA Kil M.D.   On: 06/04/2024 13:55    Pending Labs Unresulted Labs (From admission, onward)     Start     Ordered   06/11/24 0500  Creatinine, serum  (enoxaparin  (LOVENOX )    CrCl >/= 30 ml/min)  Weekly,   R     Comments: while on enoxaparin  therapy    06/04/24 1648   06/05/24 0500  HIV Antibody (routine testing w rflx)  (HIV Antibody (Routine testing w reflex) panel)  Tomorrow morning,   R        06/04/24 1648   06/05/24 0500  Comprehensive metabolic panel  Tomorrow morning,   R        06/04/24 1648   06/05/24 0500  CBC  Tomorrow morning,   R        06/04/24 1648   06/04/24 1711  Basic metabolic panel  Now then every 6 hours,   R (with TIMED occurrences)      06/04/24 1710   06/04/24 1644  CBC  (enoxaparin  (LOVENOX )    CrCl >/= 30 ml/min)  Once,   R       Comments: Baseline for enoxaparin  therapy IF NOT ALREADY DRAWN.   Notify MD if PLT < 100 K.    06/04/24 1648   06/04/24 1621  Urine Culture  Once,   URGENT       Question:  Indication  Answer:  Flank Pain   06/04/24 1620   06/04/24 1421  Basic metabolic panel  (Diabetes Ketoacidosis (DKA))  STAT Now then every 4 hours ,   STAT      06/04/24 1421            Vitals/Pain Today's Vitals   06/04/24 0927 06/04/24 1133 06/04/24 1455 06/04/24 1534  BP:  (!) 155/106 (!) 167/92   Pulse:  99 (!) 120   Resp:  16 16   Temp:  (!) 97.5 F (36.4 C) 98.2 F (36.8 C)   TempSrc:  Oral Oral   SpO2:  97% 99%   PainSc: 10-Worst pain ever   0-No pain    Isolation Precautions No active isolations  Medications Medications  lactated ringers  infusion (0 mLs Intravenous Hold 06/04/24 1500)  dextrose  5 % in lactated ringers  infusion (1,000 mLs Intravenous New Bag/Given 06/04/24 1618)  dextrose  50 % solution 0-50 mL (has no administration in time range)  potassium chloride  10 mEq in 100 mL IVPB (10 mEq Intravenous New Bag/Given 06/04/24 1636)  enoxaparin  (LOVENOX ) injection 40 mg (has no administration in time range)  insulin  glargine-yfgn (SEMGLEE ) injection 10 Units (has no administration in time range)  0.9 %  sodium chloride  infusion (has no administration in time range)  acetaminophen  (TYLENOL ) tablet 650 mg (has no administration in time range)    Or  acetaminophen  (TYLENOL ) suppository 650 mg (has no administration in time range)  HYDROmorphone  (DILAUDID ) injection 0.5-1 mg (has no administration in time range)  ondansetron  (ZOFRAN ) tablet 4 mg (has no administration in time range)    Or  ondansetron  (ZOFRAN ) injection 4 mg (has no administration in time range)  levothyroxine  (SYNTHROID ) tablet 150 mcg (has no administration in time range)  metoCLOPramide  (REGLAN ) tablet 5 mg (has  no administration in time range)  QUEtiapine  (SEROQUEL ) tablet 50 mg (has no administration in time range)  sucralfate  (CARAFATE ) tablet 1 g (has no administration in time  range)  traZODone  (DESYREL ) tablet 50 mg (has no administration in time range)  pantoprazole  (PROTONIX ) injection 40 mg (has no administration in time range)  alum & mag hydroxide-simeth (MAALOX/MYLANTA) 200-200-20 MG/5ML suspension 30 mL (has no administration in time range)  insulin  aspart (novoLOG ) injection 0-9 Units (has no administration in time range)  insulin  aspart (novoLOG ) injection 0-5 Units (has no administration in time range)  HYDROmorphone  (DILAUDID ) injection 1 mg (1 mg Intravenous Given 06/04/24 0927)  ondansetron  (ZOFRAN ) injection 4 mg (4 mg Intravenous Given 06/04/24 0927)  iohexol  (OMNIPAQUE ) 300 MG/ML solution 100 mL (100 mLs Intravenous Contrast Given 06/04/24 1223)  promethazine  (PHENERGAN ) 12.5 mg in sodium chloride  0.9 % 50 mL IVPB (0 mg Intravenous Stopped 06/04/24 1349)  sodium chloride  0.9 % bolus 1,000 mL (0 mLs Intravenous Stopped 06/04/24 1348)  lactated ringers  bolus 1,088 mL (1,088 mLs Intravenous New Bag/Given 06/04/24 1453)  HYDROmorphone  (DILAUDID ) injection 1 mg (1 mg Intravenous Given 06/04/24 1458)  potassium chloride  SA (KLOR-CON  M) CR tablet 40 mEq (40 mEq Oral Given 06/04/24 1500)    Mobility walks

## 2024-06-05 DIAGNOSIS — R338 Other retention of urine: Secondary | ICD-10-CM

## 2024-06-05 DIAGNOSIS — E860 Dehydration: Secondary | ICD-10-CM | POA: Diagnosis not present

## 2024-06-05 LAB — GLUCOSE, CAPILLARY
Glucose-Capillary: 171 mg/dL — ABNORMAL HIGH (ref 70–99)
Glucose-Capillary: 97 mg/dL (ref 70–99)
Glucose-Capillary: 161 mg/dL — ABNORMAL HIGH (ref 70–99)
Glucose-Capillary: 135 mg/dL — ABNORMAL HIGH (ref 70–99)

## 2024-06-05 LAB — CBC
HCT: 37.2 % (ref 36.0–46.0)
Hemoglobin: 12 g/dL (ref 12.0–15.0)
MCH: 28 pg (ref 26.0–34.0)
MCHC: 32.3 g/dL (ref 30.0–36.0)
MCV: 86.9 fL (ref 80.0–100.0)
Platelets: 301 10*3/uL (ref 150–400)
RBC: 4.28 MIL/uL (ref 3.87–5.11)
RDW: 15.8 % — ABNORMAL HIGH (ref 11.5–15.5)
WBC: 9.6 10*3/uL (ref 4.0–10.5)
nRBC: 0 % (ref 0.0–0.2)

## 2024-06-05 LAB — COMPREHENSIVE METABOLIC PANEL WITH GFR
ALT: 12 U/L (ref 0–44)
AST: 14 U/L — ABNORMAL LOW (ref 15–41)
Albumin: 3.3 g/dL — ABNORMAL LOW (ref 3.5–5.0)
Alkaline Phosphatase: 53 U/L (ref 38–126)
Anion gap: 7 (ref 5–15)
BUN: 18 mg/dL (ref 6–20)
CO2: 25 mmol/L (ref 22–32)
Calcium: 8.2 mg/dL — ABNORMAL LOW (ref 8.9–10.3)
Chloride: 105 mmol/L (ref 98–111)
Creatinine, Ser: 0.63 mg/dL (ref 0.44–1.00)
GFR, Estimated: >60 mL/min (ref >60)
Glucose, Bld: 175 mg/dL — ABNORMAL HIGH (ref 70–99)
Potassium: 4.3 mmol/L (ref 3.5–5.1)
Sodium: 137 mmol/L (ref 135–145)
Total Bilirubin: 1 mg/dL (ref 0.0–1.2)
Total Protein: 5.9 g/dL — ABNORMAL LOW (ref 6.5–8.1)

## 2024-06-05 LAB — HIV ANTIBODY (ROUTINE TESTING W REFLEX): HIV Screen 4th Generation wRfx: NONREACTIVE

## 2024-06-05 LAB — BASIC METABOLIC PANEL WITH GFR
Anion gap: 7 (ref 5–15)
BUN: 17 mg/dL (ref 6–20)
CO2: 24 mmol/L (ref 22–32)
Calcium: 8.3 mg/dL — ABNORMAL LOW (ref 8.9–10.3)
Chloride: 107 mmol/L (ref 98–111)
Creatinine, Ser: 0.63 mg/dL (ref 0.44–1.00)
GFR, Estimated: >60 mL/min (ref >60)
Glucose, Bld: 160 mg/dL — ABNORMAL HIGH (ref 70–99)
Potassium: 4.9 mmol/L (ref 3.5–5.1)
Sodium: 138 mmol/L (ref 135–145)

## 2024-06-05 MED ORDER — KETOROLAC TROMETHAMINE 15 MG/ML IJ SOLN
15.0000 mg | Freq: Once | INTRAMUSCULAR | Status: AC
  Administered 2024-06-05: 15 mg via INTRAVENOUS
  Filled 2024-06-05: qty 1

## 2024-06-05 MED ORDER — GABAPENTIN 100 MG PO CAPS
100.0000 mg | ORAL_CAPSULE | Freq: Every day | ORAL | Status: DC
  Administered 2024-06-05: 100 mg via ORAL
  Filled 2024-06-05: qty 1

## 2024-06-05 MED ORDER — OXYCODONE HCL 5 MG PO TABS
5.0000 mg | ORAL_TABLET | Freq: Four times a day (QID) | ORAL | Status: DC | PRN
  Administered 2024-06-05 – 2024-06-06 (×2): 5 mg via ORAL
  Filled 2024-06-05 (×2): qty 1

## 2024-06-05 NOTE — Plan of Care (Signed)

## 2024-06-05 NOTE — Progress Notes (Signed)
 Mobility Specialist - Progress Note   06/05/24 1009  Mobility  Activity Ambulated independently in hallway  Level of Assistance Independent  Assistive Device None  Distance Ambulated (ft) 1050 ft  Activity Response Tolerated well  Mobility Referral Yes  Mobility visit 1 Mobility  Mobility Specialist Start Time (ACUTE ONLY) 0954  Mobility Specialist Stop Time (ACUTE ONLY) 1008  Mobility Specialist Time Calculation (min) (ACUTE ONLY) 14 min   Pt received in bed and agreeable to mobility. No complaints during session. Pt to bed after session with all needs met.    [facility]

## 2024-06-05 NOTE — TOC Initial Note (Signed)
 Transition of Care [facility]) - Initial/Assessment Note    Patient Details  Name: [name] [name] MRN: [id number] Date of Birth: 1961-05-02  [facility] CM/SW Contact:    Doneta Glenys DASEN, RN Phone Number: 06/05/2024,   Clinical Narrative:                 CM spoke with patient in the room. PTA lives with spouse Norleen in a house; PCP/insurance verified; DME-rollator, NO HH, oxygen, or SDOH needs; patient states spouse will transport home at discharge. No TOC needs identified during visit. Place consult if needs present.  Expected Discharge Plan: Home/Self Care Barriers to Discharge: No Barriers Identified   Patient Goals and CMS Choice Patient states their goals for this hospitalization and ongoing recovery are:: home with spouse CMS Medicare.gov Compare Post Acute Care list provided to::  (na) Choice offered to / list presented to : NA [place] ownership interest in [facility].provided to:: Parent NA    Expected Discharge Plan and Services In-house Referral: NA Discharge Planning Services: NA Post Acute Care Choice: NA Living arrangements for the past 2 months: Single Family Home                 DME Arranged: N/A DME Agency: NA       HH Arranged: NA HH Agency: NA        Prior Living Arrangements/Services Living arrangements for the past 2 months: Single Family Home Lives with:: Spouse Patient language and need for interpreter reviewed:: Yes Do you feel safe going back to the place where you live?: Yes      Need for Family Participation in Patient Care: No (Comment) Care giver support system in place?: Yes (comment) Current home services:  (rollator) Criminal Activity/Legal Involvement Pertinent to Current Situation/Hospitalization: No - Comment as needed  Activities of Daily Living   ADL Screening (condition at time of admission) Independently performs ADLs?: Yes (appropriate for developmental age) Is the patient deaf or have difficulty  hearing?: No Does the patient have difficulty seeing, even when wearing glasses/contacts?: No Does the patient have difficulty concentrating, remembering, or making decisions?: No  Permission Sought/Granted Permission sought to share information with : Case Manager Permission granted to share information with : No              Emotional Assessment Appearance:: Appears stated age Attitude/Demeanor/Rapport: Engaged Affect (typically observed): Appropriate Orientation: : Oriented to Self, Oriented to Place, Oriented to  Time, Oriented to Situation Alcohol / Substance Use: Not Applicable Psych Involvement: No (comment)  Admission diagnosis:  Pyelonephritis [N12] DKA (diabetic ketoacidosis) ([name]) [E11.10] Patient Active Problem List   Diagnosis Date Noted   Pyelonephritis 05/28/2024   Acute pancreatitis 01/21/2024   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   Polycythemia 01/05/2024   Diabetic ketoacidosis associated with type 1 diabetes mellitus ([name]) 12/07/2023   Depression 11/12/2023   Intractable abdominal pain 10/26/2023   Gall bladder polyp 10/25/2023   Peptic ulcer disease 10/25/2023   Bile duct abnormality 09/24/2023   Gastritis and gastroduodenitis 09/24/2023   Multiple duodenal ulcers 09/24/2023   Esophagitis 09/23/2023   Abdominal pain, bilateral upper quadrant 09/23/2023   Intractable nausea 09/22/2023   Hyperglycemia 09/02/2023   DKA (diabetic ketoacidosis) ([name]) 08/01/2023   Intractable vomiting with nausea 06/14/2023   non obstructive CAD (coronary artery disease) 05/14/2023   Urinary retention 05/14/2023   Uncontrolled type 1 diabetes mellitus with hyperglycemia, with long-term current use of insulin  ([name]) 05/14/2023   UTI (urinary tract infection)  05/14/2023   Abnormal CT scan, esophagus 02/05/2023   Gastroparesis 01/07/2023   History of hypotension 10/18/2022   Metabolic alkalosis 10/18/2022   Dyslipidemia 10/18/2022   Chronic pain of both knees 08/20/2022    Intractable nausea and vomiting 03/27/2022   Marijuana abuse 03/27/2022   Bladder wall thickening 03/09/2021   Chronic orthostatic hypotension 03/06/2021   Gastric erosion    Urinary frequency 02/10/2020   Chronic diastolic HF (heart failure) ([name]) 10/12/2019   Duodenal ulcer without hemorrhage or perforation    Acute esophagitis    Duodenitis    Constipation    Nausea and vomiting 03/25/2019   MDD (major depressive disorder), recurrent episode, moderate ([name]) 07/01/2018   Dehydration 05/26/2018   Colitis 03/07/2018   Chronic chest pain    Noncompliance 01/14/2018   Migraines 12/11/2017   Phrygian cap 06/20/2017   Leukocytosis    Hypokalemia 02/15/2017   NSTEMI (non-ST elevated myocardial infarction) ([name]) 02/11/2017   Paroxysmal A-fib ([name]) 02/01/2015   Kidney disease, chronic, 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 [place] [address] Phone: [phone] Fax: [phone]     Social Drivers of Health (SDOH) Social History: SDOH Screenings   Food Insecurity: No Food Insecurity (06/05/2024)  Housing: Low Risk  (06/05/2024)  Transportation Needs: No Transportation Needs (06/05/2024)  Utilities: Not At Risk (06/05/2024)  Depression (PHQ2-9): High Risk (01/24/2022)  Financial Resource Strain: Low Risk  (04/25/2024)   Received from [facility]  Physical Activity: Unknown (04/25/2024)   Received from [facility]  Social Connections: Moderately Isolated (05/28/2024)  Stress: No Stress Concern Present (04/25/2024)   Received from [facility]  Tobacco Use: Medium Risk (06/04/2024)   SDOH Interventions:     Readmission  Risk Interventions    05/31/2024   11:30 AM 01/23/2024    1:16 PM 01/14/2024    1:35 PM  Readmission Risk Prevention Plan  Transportation Screening Complete Complete Complete  Medication Review Oceanographer) Complete Complete Complete  PCP or Specialist appointment within 3-5 days of discharge Complete Complete Complete  HRI or Home Care Consult Complete Complete Complete  SW Recovery Care/Counseling Consult Complete Complete Complete  Palliative Care Screening Not Applicable Not Applicable Not Applicable  Skilled Nursing Facility Not Applicable Not Applicable Not Applicable

## 2024-06-05 NOTE — Progress Notes (Signed)
 PROGRESS NOTE    [name] [name]  FMW:[id number] DOB: Jul 19, 1961 DOA: 06/04/2024 PCP: Arlyss Eugene BRAVO, NP    Brief Narrative:  [name] [name] is a pleasant 63 y.o. female with medical history significant for chronic abdominal pain, CHF, poorly controlled diabetes, HTN, hypothyroidism presented to ED with recurrent flank pain, nausea vomiting.  She was recently admitted between 05/28/2024 and 05/31/2024 at [facility] for similar complaints.  She was positive for Staph epidermidis in the urine and she was treated with vancomycin .  Prior to that she was treated with Keflex  as outpatient.  She was also found to have acute colitis for which initially she was treated with ceftriaxone  and Flagyl  later patient was discharged home on Cipro  and Flagyl  for 5 days course.  Patient was supposed to complete the 5 days course today.  However, patient started to have persistent nausea, vomiting and abdominal pain and she was not able to keep anything down since yesterday.  Slowly improving.  Suspect can go home in the morning  Assessment and Plan: Intractable nausea vomiting and abdominal pain in the setting of chronic abdominal pain. - It appears a Foley was placed in the ER and patient states she had acute urinary retention with a liter removed but I can find no documentation - Voiding trial -Suspect patient was dehydrated - IV fluids - Advance diet and monitor to see if she tolerates   Hyperglycemia/starvation ketosis - Patient has diabetes and she uses pump at home. - She was not using pump due to she was not eating since yesterday. - Sliding scale insulin  and long-acting Recent treatment of UTI/pyelonephritis - She has a history of chronic abdominal pain. - Since she had a recent treatment with antibiotics-will hold on antibiotics as afebrile and no white blood cell count   Hypertension: Appears to be stable - Continue to follow blood pressure monitoring   Acute on chronic abdominal  pain/pain syndrome - Avoid IV pain medicine and would limit oral   Hypothyroidism  -continue levothyroxine  150 mcg   Chronic marijuana use - Extensive counseling was done    DVT prophylaxis: enoxaparin  (LOVENOX ) injection 40 mg Start: 06/04/24 1800 SCDs Start: 06/04/24 1645    Code Status: Full Code Family Communication:   Disposition Plan:  Level of care: Med-Surg  If continues to improve, suspect can go home in the morning    Consultants:  None   Subjective: Says she had the inability to urinate in the ER and a Foley had to be placed  Objective: Vitals:   06/04/24 1807 06/04/24 2147 06/05/24 0427 06/05/24 1216  BP: 125/83 104/70 122/80 (!) 147/93  Pulse: 98 91 87 83  Resp: 18 18 16 18   Temp: 98.5 F (36.9 C) 98.4 F (36.9 C) 98.4 F (36.9 C) (!) 97.5 F (36.4 C)  TempSrc:    Oral  SpO2: 99% 100% 99% 100%    Intake/Output Summary (Last 24 hours) at 06/05/2024 1320 Last data filed at 06/05/2024 0900 Gross per 24 hour  Intake 1050 ml  Output 375 ml  Net 675 ml   There were no vitals filed for this visit.  Examination:   General: Appearance:    Well developed, well nourished female in no acute distress     Lungs:     Clear to auscultation bilaterally, respirations unlabored  Heart:    Normal heart rate.    MS:   All extremities are intact.    Neurologic:   Awake, alert, oriented x  3. No apparent focal neurological           defect.        Data Reviewed: I have personally reviewed following labs and imaging studies  CBC: Recent Labs  Lab 05/30/24 0647 05/31/24 0714 06/04/24 0736 06/04/24 2059 06/05/24 0409  WBC 9.2 7.5 13.0* 12.0* 9.6  HGB 14.4 14.8 15.3* 13.4 12.0  HCT 44.7 45.3 46.6* 42.2 37.2  MCV 88.3 87.3 87.6 89.8 86.9  PLT 317 320 387 304 301   Basic Metabolic Panel: Recent Labs  Lab 05/30/24 0647 05/31/24 0714 06/04/24 0958 06/04/24 1421 06/04/24 2059 06/05/24 0043 06/05/24 0409  NA 132* 132* 137 138 139 138 137  K 4.2  4.3 3.3* 3.9 4.8 4.9 4.3  CL 96* 100 96* 100 103 107 105  CO2 23 21* 25 23 25 24 25   GLUCOSE 114* 260* 447* 359* 162* 160* 175*  BUN 19 18 24* 18 16 17 18   CREATININE 1.02* 0.83 0.75 0.81 0.59 0.63 0.63  CALCIUM  8.7* 8.6* 9.4 9.0 8.7* 8.3* 8.2*  PHOS 3.7 3.2  --   --   --   --   --    GFR: Estimated Creatinine Clearance: 56.3 mL/min (by C-G formula based on SCr of 0.63 mg/dL). Liver Function Tests: Recent Labs  Lab 05/30/24 0647 05/31/24 0714 06/04/24 0958 06/05/24 0409  AST  --   --  18 14*  ALT  --   --  18 12  ALKPHOS  --   --  76 53  BILITOT  --   --  0.7 1.0  PROT  --   --  7.8 5.9*  ALBUMIN 3.6 3.5 4.4 3.3*   Recent Labs  Lab 06/04/24 0958  LIPASE 28   No results for input(s): AMMONIA in the last 168 hours. Coagulation Profile: No results for input(s): INR, PROTIME in the last 168 hours. Cardiac Enzymes: No results for input(s): CKTOTAL, CKMB, CKMBINDEX, TROPONINI in the last 168 hours. BNP (last 3 results) No results for input(s): PROBNP in the last 8760 hours. HbA1C: No results for input(s): HGBA1C in the last 72 hours. CBG: Recent Labs  Lab 06/04/24 1609 06/04/24 1732 06/04/24 2044 06/05/24 0738 06/05/24 1213  GLUCAP 248* 158* 152* 171* 97   Lipid Profile: No results for input(s): CHOL, HDL, LDLCALC, TRIG, CHOLHDL, LDLDIRECT in the last 72 hours. Thyroid  Function Tests: No results for input(s): TSH, T4TOTAL, FREET4, T3FREE, THYROIDAB in the last 72 hours. Anemia Panel: No results for input(s): VITAMINB12, FOLATE, FERRITIN, TIBC, IRON, RETICCTPCT in the last 72 hours. Sepsis Labs: No results for input(s): PROCALCITON, LATICACIDVEN in the last 168 hours.  Recent Results (from the past 240 hours)  Urine Culture     Status: Abnormal   Collection Time: 05/28/24 12:06 PM   Specimen: Urine, Clean Catch  Result Value Ref Range Status   Specimen Description   Final    URINE, CLEAN CATCH Performed  at [facility], [address], [place], [place] [address]    Special Requests   Final    NONE Performed at [facility], [address], [place], [place] [address]    Culture >=100,000 COLONIES/mL STAPHYLOCOCCUS EPIDERMIDIS (A)  Final   Report Status 05/30/2024 FINAL  Final   Organism ID, Bacteria STAPHYLOCOCCUS EPIDERMIDIS (A)  Final      Susceptibility   Staphylococcus epidermidis - MIC*    CIPROFLOXACIN  <=0.5 SENSITIVE Sensitive     GENTAMICIN <=0.5 SENSITIVE Sensitive  NITROFURANTOIN  <=16 SENSITIVE Sensitive     OXACILLIN >=4 RESISTANT Resistant     TETRACYCLINE  <=1 SENSITIVE Sensitive     VANCOMYCIN  2 SENSITIVE Sensitive     TRIMETH /SULFA  80 RESISTANT Resistant     RIFAMPIN <=0.5 SENSITIVE Sensitive     Inducible Clindamycin NEGATIVE Sensitive     * >=100,000 COLONIES/mL STAPHYLOCOCCUS EPIDERMIDIS         Radiology Studies: CT ABDOMEN PELVIS W CONTRAST Result Date: 06/04/2024 CLINICAL DATA:  Acute abdominal pain and nausea.  Dysuria. EXAM: CT ABDOMEN AND PELVIS WITH CONTRAST TECHNIQUE: Multidetector CT imaging of the abdomen and pelvis was performed using the standard protocol following bolus administration of intravenous contrast. RADIATION DOSE REDUCTION: This exam was performed according to the departmental dose-optimization program which includes automated exposure control, adjustment of the mA and/or kV according to patient size and/or use of iterative reconstruction technique. CONTRAST:  OMNIPAQUE  IOHEXOL  300 MG/ML  SOLN COMPARISON:  05/28/2024 FINDINGS: Lower Chest: No acute findings. Hepatobiliary: No suspicious hepatic masses identified. Mild diffuse hepatic steatosis. Prior cholecystectomy. No evidence of biliary obstruction. Pancreas:  No mass or inflammatory changes. Spleen: Within normal limits in size and appearance. Adrenals/Urinary Tract: No suspicious masses identified. No evidence of ureteral calculi or  hydronephrosis. Foley catheter seen in bladder. Stomach/Bowel: No evidence of obstruction, inflammatory process or abnormal fluid collections. Normal appendix visualized. Diffuse colonic diverticulosis again noted, without signs of diverticulitis. Vascular/Lymphatic: No pathologically enlarged lymph nodes. No acute vascular findings. Reproductive: Prior hysterectomy noted. Adnexal regions are unremarkable in appearance. Other:  None. Musculoskeletal:  No suspicious bone lesions identified. IMPRESSION: No acute findings. Colonic diverticulosis, without radiographic evidence of diverticulitis. Mild hepatic steatosis. Electronically Signed   By: Norleen DELENA Kil M.D.   On: 06/04/2024 13:55        Scheduled Meds:  enoxaparin  (LOVENOX ) injection  40 mg Subcutaneous Q24H   insulin  aspart  0-5 Units Subcutaneous QHS   insulin  aspart  0-9 Units Subcutaneous TID WC   insulin  glargine-yfgn  10 Units Subcutaneous QHS   levothyroxine   150 mcg Oral QAC breakfast   metoCLOPramide   5 mg Oral TID AC   pantoprazole  (PROTONIX ) IV  40 mg Intravenous Q12H   sucralfate   1 g Oral BID   Continuous Infusions:  sodium chloride  100 mL/hr at 06/05/24 9277   dextrose  5% lactated ringers  Stopped (06/04/24 1736)   lactated ringers  Stopped (06/04/24 1500)     LOS: 0 days    Time spent: 45 minutes spent on chart review, discussion with nursing staff, consultants, updating family and interview/physical exam; more than 50% of that time was spent in counseling and/or coordination of care.    Ira Dougher U Kristyna Bradstreet, DO [facility] Available via Epic secure chat 7am-7pm After these hours, please refer to coverage provider listed on [email or web address] 06/05/2024, 1:20 PM

## 2024-06-05 NOTE — Inpatient Diabetes Management (Signed)
 Inpatient Diabetes Program Recommendations  AACE/ADA: New Consensus Statement on Inpatient Glycemic Control (2015)  Target Ranges:  Prepandial:   less than 140 mg/dL      Peak postprandial:   less than 180 mg/dL (1-2 hours)      Critically ill patients:  140 - 180 mg/dL   Lab Results  Component Value Date   GLUCAP 97 06/05/2024   HGBA1C 8.4 (H) 05/29/2024    Review of Glycemic Control  Latest Reference Range & Units 06/04/24 20:44 06/05/24 07:38 06/05/24 12:13  Glucose-Capillary 70 - 99 mg/dL 847 (H) 828 (H) 97  (H): Data is abnormally high Diabetes history: Type 2 DM Outpatient Diabetes medications: Toujeo  25 units at bedtime, Novolog  10 units TID Current orders for Inpatient glycemic control: Semglee  10 units at bedtime, Novolog  0-9 units TID & HS  Inpatient Diabetes Program Recommendations:    Spoke with patient regarding outpatient diabetes management. Patient verified medications above as back up doses, however uses an Omnipod that started at the beginning of the year along with Dexcom. A1C has improved considerably since application.  Patient had removed insulin  pump once she began feeling poorly because it was time to replace insertion site. She reports that at the time her blood sugar was okay and knew she was coming to the hospital. Does not have device at bedside and plans to reapply once she gets home. Reviewed patient's current A1c of 8.4%. Explained what a A1c is and what it measures. Also reviewed goal A1c with patient, importance of good glucose control @ home, and blood sugar goals. Reviewed patho of DM, signs and symptoms of hyper vs hypo glycemia, interventions , timing for insulin  pump reapplication, sick day rules, keeping insulin  pump on even when not eating or coming to hospital, vascular changes, impact of infections and other commorbidities.  Patient has supplies that she needs. Plans to make follow up appointment with [facility] endocrinology for next month. No  additional questions at this time.,   Thanks, Tinnie Minus, MSN, RNC-OB Diabetes Coordinator [phone] (8a-5p)

## 2024-06-06 DIAGNOSIS — N12 Tubulo-interstitial nephritis, not specified as acute or chronic: Secondary | ICD-10-CM

## 2024-06-06 LAB — BASIC METABOLIC PANEL WITH GFR
Anion gap: 9 (ref 5–15)
BUN: 19 mg/dL (ref 6–20)
CO2: 24 mmol/L (ref 22–32)
Calcium: 8.7 mg/dL — ABNORMAL LOW (ref 8.9–10.3)
Chloride: 102 mmol/L (ref 98–111)
Creatinine, Ser: 0.85 mg/dL (ref 0.44–1.00)
GFR, Estimated: >60 mL/min (ref >60)
Glucose, Bld: 172 mg/dL — ABNORMAL HIGH (ref 70–99)
Potassium: 4.1 mmol/L (ref 3.5–5.1)
Sodium: 135 mmol/L (ref 135–145)

## 2024-06-06 LAB — URINE CULTURE: Culture: NO GROWTH

## 2024-06-06 LAB — GLUCOSE, CAPILLARY: Glucose-Capillary: 204 mg/dL — ABNORMAL HIGH (ref 70–99)

## 2024-06-06 LAB — CBC
HCT: 40.9 % (ref 36.0–46.0)
Hemoglobin: 13.5 g/dL (ref 12.0–15.0)
MCH: 28.7 pg (ref 26.0–34.0)
MCHC: 33 g/dL (ref 30.0–36.0)
MCV: 86.8 fL (ref 80.0–100.0)
Platelets: 310 10*3/uL (ref 150–400)
RBC: 4.71 MIL/uL (ref 3.87–5.11)
RDW: 15.3 % (ref 11.5–15.5)
WBC: 8.1 10*3/uL (ref 4.0–10.5)
nRBC: 0 % (ref 0.0–0.2)

## 2024-06-06 MED ORDER — ACETAMINOPHEN 650 MG RE SUPP: 650.0000 mg | Freq: Four times a day (QID) | RECTAL | 0 refills | Status: DC | PRN

## 2024-06-06 NOTE — Discharge Summary (Signed)
 Physician Discharge Summary  [name] [name] FMW:[id number] DOB: [birth year] DOA: 06/04/2024  PCP: Arlyss Eugene BRAVO, NP  Admit date: 06/04/2024 Discharge date: 06/06/2024  Admitted From: Home  Disposition: Home  Recommendations for Outpatient Follow-up:  Follow up with PCP in 1-2 weeks Please obtain BMP/CBC in one week  Home Health: None  Equipment/Devices: None Discharge Condition: Stable CODE STATUS: Full code Diet recommendation: Carb modified Brief/Interim Summary:  63 y.o. female with medical history significant for chronic abdominal pain, CHF, poorly controlled diabetes, HTN, hypothyroidism presented to ED with recurrent flank pain, nausea vomiting.  She was recently admitted between 05/28/2024 and 05/31/2024 at [facility] for similar complaints.  She was positive for Staph epidermidis in the urine and she was treated with vancomycin .  Prior to that she was treated with Keflex  as outpatient.  She was also found to have acute colitis for which initially she was treated with ceftriaxone  and Flagyl  later patient was discharged home on Cipro  and Flagyl  for 5 days course.  Patient was supposed to complete the 5 days course today.  However, patient started to have persistent nausea, vomiting and abdominal pain and she was not able to keep anything down since yesterday.  Slowly improving.  Suspect can go home in the morning    Discharge Diagnoses:  Active Problems:   Urinary retention   Hypothyroidism   Hyperlipidemia   GERD   Nausea and vomiting   Marijuana abuse   DKA (diabetic ketoacidosis) ([name])  Intractable nausea vomiting and abdominal pain in the setting of chronic abdominal pain.  This was resolved with symptomatic treatments.  She was able to tolerate a diet prior to discharge.     Hyperglycemia/starvation ketosis - Patient has diabetes and she uses pump at home. - She was not using pump due to she was not eating - She was treated with insulin  Semglee  and NovoLog   and discharged on her current home dose of insulin  pump.   UTI/pyelonephritis - She has a history of chronic abdominal pain. - Since she had a recent treatment with antibiotics she was not given any antibiotics during this stay. She did have acute urinary retention for which a Foley was placed in the ED.  This Foley was taken out a day prior to discharge and she was able to urinate without any problem.  Hypertension: Appears to be stable - Continue home meds   Hypothyroidism  -continue levothyroxine  150 mcg   Chronic marijuana use - Extensive counseling was done    Estimated body mass index is 24.2 kg/m as calculated from the following:   Height as of 05/28/24: 4' 11.02 (1.499 m).   Weight as of 05/28/24: 54.4 kg.  Discharge Instructions  Discharge Instructions     Diet - low sodium heart healthy   Complete by: As directed    Increase activity slowly   Complete by: As directed       Allergies as of 06/06/2024       Reactions   Codeine Hives, Anxiety   Ciprofloxacin  Diarrhea, Nausea Only, Other (See Comments)   Caused extreme nausea   Flagyl  [metronidazole ] Nausea Only, Other (See Comments)   Can tolerate ONLY VIA IV (otherwise, SEVERE NAUSEA)        Medication List     STOP taking these medications    ciprofloxacin  500 MG tablet Commonly known as: Cipro    escitalopram  10 MG tablet Commonly known as: LEXAPRO    metoCLOPramide  5 MG tablet Commonly known as: Reglan   metroNIDAZOLE  500 MG tablet Commonly known as: FLAGYL    phenazopyridine  200 MG tablet Commonly known as: PYRIDIUM    rosuvastatin  10 MG tablet Commonly known as: CRESTOR    tamsulosin  0.4 MG Caps capsule Commonly known as: FLOMAX        TAKE these medications    acetaminophen  650 MG suppository Commonly known as: TYLENOL  Place 1 suppository (650 mg total) rectally every 6 (six) hours as needed for mild pain (pain score 1-3) or fever (or Fever >/= 101).   aspirin  EC 81 MG tablet Take  81 mg by mouth daily. Swallow whole.   azelastine  0.1 % nasal spray Commonly known as: ASTELIN  Place 1 spray into both nostrils 2 (two) times daily as needed for rhinitis or allergies.   Baqsimi One Pack 3 MG/DOSE Powd Generic drug: Glucagon Place 3 mg into the nose as needed (severe low blood sugar).   Dexcom G7 Sensor Misc Inject 1 each into the skin See admin instructions. Place 1 new sensor into the skin every 10 days   gabapentin  100 MG capsule Commonly known as: NEURONTIN  Take 100 mg by mouth at bedtime.   Glucagon Emergency 1 MG Kit Inject 1 mg into the skin See admin instructions. Inject one mg into the skin see administration instructions. Follow package directions for low blood sugar.   levothyroxine  150 MCG tablet Commonly known as: SYNTHROID  Take 1 tablet (150 mcg total) by mouth daily before breakfast.   midodrine  5 MG tablet Commonly known as: PROAMATINE  Take 5 mg by mouth 3 (three) times daily with meals.   NovoLOG  FlexPen 100 UNIT/ML FlexPen Generic drug: insulin  aspart Inject 10 Units into the skin 3 (three) times daily with meals. Plus sliding scale TID What changed:  when to take this additional instructions   omega-3 acid ethyl esters 1 g capsule Commonly known as: LOVAZA  Take 2 capsules (2 g total) by mouth 2 (two) times daily.   Omnipod 5 G7 Pods (Gen 5) Misc Inject 1 Device into the skin every 3 (three) days.   ondansetron  4 MG tablet Commonly known as: ZOFRAN  Take 2 tablets (8 mg total) by mouth every 8 (eight) hours as needed for nausea or vomiting.   oxyCODONE  5 MG immediate release tablet Commonly known as: Oxy IR/ROXICODONE  Take 1 tablet (5 mg total) by mouth every 6 (six) hours as needed for moderate pain (pain score 4-6) or severe pain (pain score 7-10).   pantoprazole  40 MG tablet Commonly known as: Protonix  Take 1 tablet (40 mg total) by mouth 2 (two) times daily. Take 30 minutes before breakfast and dinner What changed:  when to  take this additional instructions   promethazine  25 MG suppository Commonly known as: PHENERGAN  Place 25 mg rectally every 6 (six) hours as needed for nausea or vomiting.   sucralfate  1 g tablet Commonly known as: CARAFATE  Take 1 tablet (1 g total) by mouth 2 (two) times daily. Do not take any other medication within 2-4 hours.   tiZANidine 4 MG tablet Commonly known as: ZANAFLEX Take 4 mg by mouth every 8 (eight) hours as needed for muscle spasms.   Toujeo  SoloStar 300 UNIT/ML Solostar Pen Generic drug: insulin  glargine (1 Unit Dial ) Inject 25 Units into the skin at bedtime. What changed:  when to take this additional instructions        Follow-up Information     Arlyss Eugene BRAVO, NP Follow up.   Specialty: Family Medicine  Allergies  Allergen Reactions   Codeine Hives and Anxiety   Ciprofloxacin  Diarrhea, Nausea Only and Other (See Comments)    Caused extreme nausea   Flagyl  [Metronidazole ] Nausea Only and Other (See Comments)    Can tolerate ONLY VIA IV (otherwise, SEVERE NAUSEA)    Consultations: None   Procedures/Studies: CT ABDOMEN PELVIS W CONTRAST Result Date: 06/04/2024 CLINICAL DATA:  Acute abdominal pain and nausea.  Dysuria. EXAM: CT ABDOMEN AND PELVIS WITH CONTRAST TECHNIQUE: Multidetector CT imaging of the abdomen and pelvis was performed using the standard protocol following bolus administration of intravenous contrast. RADIATION DOSE REDUCTION: This exam was performed according to the departmental dose-optimization program which includes automated exposure control, adjustment of the mA and/or kV according to patient size and/or use of iterative reconstruction technique. CONTRAST:  OMNIPAQUE  IOHEXOL  300 MG/ML  SOLN COMPARISON:  05/28/2024 FINDINGS: Lower Chest: No acute findings. Hepatobiliary: No suspicious hepatic masses identified. Mild diffuse hepatic steatosis. Prior cholecystectomy. No evidence of biliary obstruction.  Pancreas:  No mass or inflammatory changes. Spleen: Within normal limits in size and appearance. Adrenals/Urinary Tract: No suspicious masses identified. No evidence of ureteral calculi or hydronephrosis. Foley catheter seen in bladder. Stomach/Bowel: No evidence of obstruction, inflammatory process or abnormal fluid collections. Normal appendix visualized. Diffuse colonic diverticulosis again noted, without signs of diverticulitis. Vascular/Lymphatic: No pathologically enlarged lymph nodes. No acute vascular findings. Reproductive: Prior hysterectomy noted. Adnexal regions are unremarkable in appearance. Other:  None. Musculoskeletal:  No suspicious bone lesions identified. IMPRESSION: No acute findings. Colonic diverticulosis, without radiographic evidence of diverticulitis. Mild hepatic steatosis. Electronically Signed   By: Norleen DELENA Kil M.D.   On: 06/04/2024 13:55   DG Abd Portable 2V Result Date: 05/29/2024 CLINICAL DATA:  Pain and vomiting EXAM: PORTABLE ABDOMEN - 2 VIEW COMPARISON:  Abdominal series 01/10/2024 FINDINGS: Generator overlies the left mid abdomen. The bowel gas pattern is normal. There is no evidence of free air. No radio-opaque calculi or other significant radiographic abnormality is seen. IMPRESSION: Negative. Electronically Signed   By: Greig Pique M.D.   On: 05/29/2024 18:24   DG Lumbar Spine 2-3 Views Result Date: 05/29/2024 CLINICAL DATA:  [id number] Low back pain 34547, inpatient EXAM: LUMBAR SPINE - 2-3 VIEW COMPARISON:  05/28/2024 CT abdomen/pelvis and 08/31/2007 lumbar spine radiographs FINDINGS: This report assumes 5 non rib-bearing lumbar vertebrae. There is a transitional L5 level. The T12 level demonstrates diminutive ribs. Lumbar vertebral body heights are preserved, with no fracture. Mild multilevel lumbar degenerative disc disease, most prominent at L3-4. Minimal 2 mm retrolisthesis L4-5. No significant facet arthropathy. No aggressive appearing focal osseous lesions.  IMPRESSION: 1. Transitional L5 level. 2. Mild multilevel lumbar degenerative disc disease, most prominent at L3-4. 3. Minimal 2 mm retrolisthesis L4-5. Electronically Signed   By: Selinda DELENA Blue M.D.   On: 05/29/2024 13:43   CT ABDOMEN PELVIS W CONTRAST Result Date: 05/28/2024 CLINICAL DATA:  Abdominal pain acute nonlocalized EXAM: CT ABDOMEN AND PELVIS WITHOUT CONTRAST TECHNIQUE: Multidetector CT imaging of the abdomen and pelvis was performed following the standard protocol without IV contrast. RADIATION DOSE REDUCTION: This exam was performed according to the departmental dose-optimization program which includes automated exposure control, adjustment of the mA and/or kV according to patient size and/or use of iterative reconstruction technique. COMPARISON:  CT abdomen and pelvis February 08, 2024 FINDINGS: Lower chest: No infiltrates or consolidations, no pleural effusions Hepatobiliary: No focal liver abnormality is seen. Status post cholecystectomy. No biliary dilatation. Pancreas: Pancreas normal size. No masses calcifications or  inflammatory changes. Spleen: Spleen normal size.  No masses. Adrenals/Urinary Tract: Adrenal glands are normal size. Follow-up recommended. Kidneys are normal. No masses calcifications or hydronephrosis Stomach/Bowel: No small or large bowel obstruction or inflammatory changes. Moderate amount of residual fecal material throughout the colon without obstruction or constipation. Vascular/Lymphatic: No significant vascular findings are present. No enlarged abdominal or pelvic lymph nodes. Reproductive: Status post hysterectomy. No adnexal masses. Other: Anterior abdominal wall unremarkable without evidence of umbilical or inguinal hernias Musculoskeletal: Visualized portion of the thoracolumbar spine and pelvic structures grossly unremarkable without evidence of fracture bony abnormalities or soft tissue masses. IMPRESSION: *Questionable subtle mucosal thickening involving the splenic  flexure of the colon that could correlate with early colitis. *Moderate amount of residual fecal material throughout the colon without obstruction or constipation. Electronically Signed   By: Franky Chard M.D.   On: 05/28/2024 10:26   (Echo, Carotid, EGD, Colonoscopy, ERCP)    Subjective: Anxious to go home sitting up in bed no nausea vomiting diarrhea abdominal pain tolerated a diet  Discharge Exam: Vitals:   06/05/24 1951 06/06/24 0526  BP: 115/69 133/88  Pulse: 80 85  Resp: 16 17  Temp: 98.7 F (37.1 C) 98.3 F (36.8 C)  SpO2: 98% 98%   Vitals:   06/05/24 0427 06/05/24 1216 06/05/24 1951 06/06/24 0526  BP: 122/80 (!) 147/93 115/69 133/88  Pulse: 87 83 80 85  Resp: 16 18 16 17   Temp: 98.4 F (36.9 C) (!) 97.5 F (36.4 C) 98.7 F (37.1 C) 98.3 F (36.8 C)  TempSrc:  Oral Oral   SpO2: 99% 100% 98% 98%    General: Pt is alert, awake, not in acute distress Cardiovascular: RRR, S1/S2 +, no rubs, no gallops Respiratory: CTA bilaterally, no wheezing, no rhonchi Abdominal: Soft, NT, ND, bowel sounds + Extremities: no edema, no cyanosis    The results of significant diagnostics from this hospitalization (including imaging, microbiology, ancillary and laboratory) are listed below for reference.     Microbiology: Recent Results (from the past 240 hours)  Urine Culture     Status: Abnormal   Collection Time: 05/28/24 12:06 PM   Specimen: Urine, Clean Catch  Result Value Ref Range Status   Specimen Description   Final    URINE, CLEAN CATCH Performed at [facility], [address] [place], [place] [address]    Special Requests   Final    NONE Performed at [facility], [address] [place], [place] [address]    Culture >=100,000 COLONIES/mL STAPHYLOCOCCUS EPIDERMIDIS (A)  Final   Report Status 05/30/2024 FINAL  Final   Organism ID, Bacteria STAPHYLOCOCCUS EPIDERMIDIS (A)  Final      Susceptibility   Staphylococcus  epidermidis - MIC*    CIPROFLOXACIN  <=0.5 SENSITIVE Sensitive     GENTAMICIN <=0.5 SENSITIVE Sensitive     NITROFURANTOIN  <=16 SENSITIVE Sensitive     OXACILLIN >=4 RESISTANT Resistant     TETRACYCLINE  <=1 SENSITIVE Sensitive     VANCOMYCIN  2 SENSITIVE Sensitive     TRIMETH /SULFA  80 RESISTANT Resistant     RIFAMPIN <=0.5 SENSITIVE Sensitive     Inducible Clindamycin NEGATIVE Sensitive     * >=100,000 COLONIES/mL STAPHYLOCOCCUS EPIDERMIDIS  Urine Culture     Status: None   Collection Time: 06/04/24 11:36 AM   Specimen: Urine, Clean Catch  Result Value Ref Range Status   Specimen Description   Final    URINE, CLEAN CATCH Performed at [facility], [address] [place], [place] [address]    Special Requests   Final    NONE Performed at [facility], [address], [place], [place] [address]    Culture   Final    NO GROWTH Performed at [facility], [address], [place], [place] [address]    Report Status 06/06/2024 FINAL  Final     Labs: BNP (last 3 results) No results for input(s): BNP in the last 8760 hours. Basic Metabolic Panel: Recent Labs  Lab 05/31/24 0714 06/04/24 0958 06/04/24 1421 06/04/24 2059 06/05/24 0043 06/05/24 0409 06/06/24 0543  NA 132*   < > 138 139 138 137 135  K 4.3   < > 3.9 4.8 4.9 4.3 4.1  CL 100   < > 100 103 107 105 102  CO2 21*   < > 23 25 24 25 24   GLUCOSE 260*   < > 359* 162* 160* 175* 172*  BUN 18   < > 18 16 17 18 19   CREATININE 0.83   < > 0.81 0.59 0.63 0.63 0.85  CALCIUM  8.6*   < > 9.0 8.7* 8.3* 8.2* 8.7*  PHOS 3.2  --   --   --   --   --   --    < > = values in this interval not displayed.   Liver Function Tests: Recent Labs  Lab 05/31/24 0714 06/04/24 0958 06/05/24 0409  AST  --  18 14*  ALT  --  18 12  ALKPHOS  --  76 53  BILITOT  --  0.7 1.0  PROT  --  7.8 5.9*  ALBUMIN 3.5 4.4 3.3*   Recent Labs  Lab 06/04/24 0958  LIPASE 28   No results for input(s):  AMMONIA in the last 168 hours. CBC: Recent Labs  Lab 05/31/24 0714 06/04/24 0736 06/04/24 2059 06/05/24 0409 06/06/24 0543  WBC 7.5 13.0* 12.0* 9.6 8.1  HGB 14.8 15.3* 13.4 12.0 13.5  HCT 45.3 46.6* 42.2 37.2 40.9  MCV 87.3 87.6 89.8 86.9 86.8  PLT 320 387 304 301 310   Cardiac Enzymes: No results for input(s): CKTOTAL, CKMB, CKMBINDEX, TROPONINI in the last 168 hours. BNP: Invalid input(s): POCBNP CBG: Recent Labs  Lab 06/05/24 0738 06/05/24 1213 06/05/24 1650 06/05/24 2034 06/06/24 0759  GLUCAP 171* 97 161* 135* 204*   D-Dimer No results for input(s): DDIMER in the last 72 hours. Hgb A1c No results for input(s): HGBA1C in the last 72 hours. Lipid Profile No results for input(s): CHOL, HDL, LDLCALC, TRIG, CHOLHDL, LDLDIRECT in the last 72 hours. Thyroid  function studies No results for input(s): TSH, T4TOTAL, T3FREE, THYROIDAB in the last 72 hours.  Invalid input(s): FREET3 Anemia work up No results for input(s): VITAMINB12, FOLATE, FERRITIN, TIBC, IRON, RETICCTPCT in the last 72 hours. Urinalysis    Component Value Date/Time   COLORURINE STRAW (A) 06/04/2024 1136   APPEARANCEUR CLEAR 06/04/2024 1136   LABSPEC 1.025 06/04/2024 1136   PHURINE 7.0 06/04/2024 1136   GLUCOSEU >=500 (A) 06/04/2024 1136   HGBUR NEGATIVE 06/04/2024 1136   BILIRUBINUR NEGATIVE 06/04/2024 1136   BILIRUBINUR negative 03/09/2021 1123   KETONESUR 20 (A) 06/04/2024 1136   PROTEINUR 100 (A) 06/04/2024 1136   UROBILINOGEN 0.2 03/09/2021 1123   UROBILINOGEN 0.2 10/28/2013 1723   NITRITE NEGATIVE 06/04/2024 1136   LEUKOCYTESUR NEGATIVE 06/04/2024 1136   Sepsis Labs Recent Labs  Lab 06/04/24 0736 06/04/24 2059 06/05/24 0409 06/06/24 0543  WBC 13.0* 12.0* 9.6  8.1   Microbiology Recent Results (from the past 240 hours)  Urine Culture     Status: Abnormal   Collection Time: 05/28/24 12:06 PM   Specimen: Urine, Clean Catch  Result  Value Ref Range Status   Specimen Description   Final    URINE, CLEAN CATCH Performed at [facility], [address] [place], [place] [address]    Special Requests   Final    NONE Performed at [facility], [address], [place], [place] [address]    Culture >=100,000 COLONIES/mL STAPHYLOCOCCUS EPIDERMIDIS (A)  Final   Report Status 05/30/2024 FINAL  Final   Organism ID, Bacteria STAPHYLOCOCCUS EPIDERMIDIS (A)  Final      Susceptibility   Staphylococcus epidermidis - MIC*    CIPROFLOXACIN  <=0.5 SENSITIVE Sensitive     GENTAMICIN <=0.5 SENSITIVE Sensitive     NITROFURANTOIN  <=16 SENSITIVE Sensitive     OXACILLIN >=4 RESISTANT Resistant     TETRACYCLINE  <=1 SENSITIVE Sensitive     VANCOMYCIN  2 SENSITIVE Sensitive     TRIMETH /SULFA  80 RESISTANT Resistant     RIFAMPIN <=0.5 SENSITIVE Sensitive     Inducible Clindamycin NEGATIVE Sensitive     * >=100,000 COLONIES/mL STAPHYLOCOCCUS EPIDERMIDIS  Urine Culture     Status: None   Collection Time: 06/04/24 11:36 AM   Specimen: Urine, Clean Catch  Result Value Ref Range Status   Specimen Description   Final    URINE, CLEAN CATCH Performed at [facility], [address], [place], [place] [address]    Special Requests   Final    NONE Performed at [facility], [address] [place], [place] [address]    Culture   Final    NO GROWTH Performed at [facility], [address], [place], [place] [address]    Report Status 06/06/2024 FINAL  Final    Time coordinating discharge:39 min SIGNED:  Almarie KANDICE Hoots, MD  [facility] 06/06/2024, 12:53 PM

## 2024-06-25 ENCOUNTER — Emergency Department (HOSPITAL_COMMUNITY)

## 2024-06-25 ENCOUNTER — Encounter (HOSPITAL_COMMUNITY): Payer: Self-pay

## 2024-06-25 ENCOUNTER — Other Ambulatory Visit: Payer: Self-pay

## 2024-06-25 ENCOUNTER — Emergency Department (HOSPITAL_COMMUNITY)
Admission: EM | Admit: 2024-06-25 | Discharge: 2024-06-25 | Disposition: A | Discharge status: Home / Self Care | Attending: Emergency Medicine | Admitting: Emergency Medicine

## 2024-06-25 DIAGNOSIS — E1122 Type 2 diabetes mellitus with diabetic chronic kidney disease: Secondary | ICD-10-CM | POA: Insufficient documentation

## 2024-06-25 DIAGNOSIS — N182 Chronic kidney disease, stage 2 (mild): Secondary | ICD-10-CM | POA: Diagnosis not present

## 2024-06-25 DIAGNOSIS — I509 Heart failure, unspecified: Secondary | ICD-10-CM | POA: Diagnosis not present

## 2024-06-25 DIAGNOSIS — R112 Nausea with vomiting, unspecified: Secondary | ICD-10-CM | POA: Diagnosis not present

## 2024-06-25 DIAGNOSIS — R1084 Generalized abdominal pain: Secondary | ICD-10-CM | POA: Insufficient documentation

## 2024-06-25 DIAGNOSIS — I130 Hypertensive heart and chronic kidney disease with heart failure and stage 1 through stage 4 chronic kidney disease, or unspecified chronic kidney disease: Secondary | ICD-10-CM | POA: Diagnosis not present

## 2024-06-25 DIAGNOSIS — Z7982 Long term (current) use of aspirin: Secondary | ICD-10-CM | POA: Insufficient documentation

## 2024-06-25 DIAGNOSIS — E039 Hypothyroidism, unspecified: Secondary | ICD-10-CM | POA: Diagnosis not present

## 2024-06-25 DIAGNOSIS — Z794 Long term (current) use of insulin: Secondary | ICD-10-CM | POA: Insufficient documentation

## 2024-06-25 LAB — URINALYSIS, ROUTINE W REFLEX MICROSCOPIC
Bacteria, UA: NONE SEEN
Bilirubin Urine: NEGATIVE
Glucose, UA: 50 mg/dL — AB
Hgb urine dipstick: NEGATIVE
Ketones, ur: 5 mg/dL — AB
Nitrite: NEGATIVE
Protein, ur: NEGATIVE mg/dL
Specific Gravity, Urine: 1.019 (ref 1.005–1.030)
pH: 7 (ref 5.0–8.0)

## 2024-06-25 LAB — CBC WITH DIFFERENTIAL/PLATELET
Abs Immature Granulocytes: 0.05 K/uL (ref 0.00–0.07)
Basophils Absolute: 0 K/uL (ref 0.0–0.1)
Basophils Relative: 0 %
Eosinophils Absolute: 0.1 K/uL (ref 0.0–0.5)
Eosinophils Relative: 1 %
HCT: 47.5 % — ABNORMAL HIGH (ref 36.0–46.0)
Hemoglobin: 15.8 g/dL — ABNORMAL HIGH (ref 12.0–15.0)
Immature Granulocytes: 1 %
Lymphocytes Relative: 18 %
Lymphs Abs: 1.8 K/uL (ref 0.7–4.0)
MCH: 28.4 pg (ref 26.0–34.0)
MCHC: 33.3 g/dL (ref 30.0–36.0)
MCV: 85.4 fL (ref 80.0–100.0)
Monocytes Absolute: 0.5 K/uL (ref 0.1–1.0)
Monocytes Relative: 5 %
Neutro Abs: 7.7 K/uL (ref 1.7–7.7)
Neutrophils Relative %: 75 %
Platelets: 423 K/uL — ABNORMAL HIGH (ref 150–400)
RBC: 5.56 MIL/uL — ABNORMAL HIGH (ref 3.87–5.11)
RDW: 16 % — ABNORMAL HIGH (ref 11.5–15.5)
WBC: 10.3 K/uL (ref 4.0–10.5)
nRBC: 0 % (ref 0.0–0.2)

## 2024-06-25 LAB — COMPREHENSIVE METABOLIC PANEL WITH GFR
ALT: 13 U/L (ref 0–44)
AST: 21 U/L (ref 15–41)
Albumin: 4.6 g/dL (ref 3.5–5.0)
Alkaline Phosphatase: 85 U/L (ref 38–126)
Anion gap: 14 (ref 5–15)
BUN: 19 mg/dL (ref 6–20)
CO2: 28 mmol/L (ref 22–32)
Calcium: 9.8 mg/dL (ref 8.9–10.3)
Chloride: 97 mmol/L — ABNORMAL LOW (ref 98–111)
Creatinine, Ser: 0.69 mg/dL (ref 0.44–1.00)
GFR, Estimated: >60 mL/min (ref >60)
Glucose, Bld: 232 mg/dL — ABNORMAL HIGH (ref 70–99)
Potassium: 4.2 mmol/L (ref 3.5–5.1)
Sodium: 139 mmol/L (ref 135–145)
Total Bilirubin: 1.3 mg/dL — ABNORMAL HIGH (ref 0.0–1.2)
Total Protein: 8.8 g/dL — ABNORMAL HIGH (ref 6.5–8.1)

## 2024-06-25 LAB — LIPASE, BLOOD: Lipase: 57 U/L — ABNORMAL HIGH (ref 11–51)

## 2024-06-25 MED ORDER — MORPHINE SULFATE (PF) 4 MG/ML IV SOLN
4.0000 mg | Freq: Once | INTRAVENOUS | Status: AC
  Administered 2024-06-25: 4 mg via INTRAVENOUS
  Filled 2024-06-25: qty 1

## 2024-06-25 MED ORDER — IOHEXOL 300 MG/ML  SOLN
100.0000 mL | Freq: Once | INTRAMUSCULAR | Status: AC | PRN
  Administered 2024-06-25: 100 mL via INTRAVENOUS

## 2024-06-25 MED ORDER — AMOXICILLIN-POT CLAVULANATE 875-125 MG PO TABS
1.0000 | ORAL_TABLET | Freq: Once | ORAL | Status: DC
  Filled 2024-06-25: qty 1

## 2024-06-25 MED ORDER — ONDANSETRON HCL 4 MG/2ML IJ SOLN
4.0000 mg | Freq: Once | INTRAMUSCULAR | Status: AC
  Administered 2024-06-25: 4 mg via INTRAVENOUS
  Filled 2024-06-25: qty 2

## 2024-06-25 MED ORDER — SODIUM CHLORIDE 0.9 % IV SOLN
25.0000 mg | Freq: Four times a day (QID) | INTRAVENOUS | Status: DC | PRN
  Administered 2024-06-25: 25 mg via INTRAVENOUS
  Filled 2024-06-25: qty 1

## 2024-06-25 MED ORDER — PROMETHAZINE HCL 25 MG PO TABS: 25.0000 mg | ORAL_TABLET | Freq: Four times a day (QID) | ORAL | 0 refills | Status: DC | PRN

## 2024-06-25 MED ORDER — PANTOPRAZOLE SODIUM 40 MG IV SOLR
40.0000 mg | Freq: Once | INTRAVENOUS | Status: AC
  Administered 2024-06-25: 40 mg via INTRAVENOUS
  Filled 2024-06-25: qty 10

## 2024-06-25 MED ORDER — OXYCODONE-ACETAMINOPHEN 5-325 MG PO TABS
1.0000 | ORAL_TABLET | Freq: Once | ORAL | Status: AC
  Administered 2024-06-25: 1 via ORAL
  Filled 2024-06-25: qty 1

## 2024-06-25 NOTE — ED Notes (Addendum)
 Patient had a incontinent diarrhea episode.States being unaware of having diarrhea. Clean sheets and brief put on.

## 2024-06-25 NOTE — ED Provider Notes (Signed)
  Physical Exam  BP (!) 146/88 (BP Location: Right Arm)   Pulse 97   Temp 97.7 F (36.5 C)   Resp 16   Ht 4' 11.02 (1.499 m)   Wt 54.4 kg   LMP 08/11/2012   SpO2 97%   BMI 24.21 kg/m   Physical Exam  Procedures  Procedures  ED Course / MDM   Clinical Course as of 06/25/24 0702  Thu Jun 25, 2024  0620 Patient reports some improvement.  Bladder is significantly distended on CT.  States that she does not feel I need to go and has to have difficulty going.  Will place Foley. [CH]  0643 CT shows some mild changes potentially consistent with colitis.  However, patient has not had any diarrhea and has no white count. [CH]    Clinical Course User Index [CH] Horton, Charmaine FALCON, MD    Received care of patient from Dr. Bari.  Please see her note for prior history, physical and care.  Briefly this is a 63 year old female with a history of chronic abdominal pain, CHF, poorly controlled diabetes, hypertension, hypothyroidism who presented with concern for abdominal pain, nausea and vomiting.  Abdominal pain, CT with diffuse wall thickening of colon, clinically not consistent with colitis without diarrhea and with more acute pain. Urinary retention noted, foley placed plan to follow up urinalysis and symptoms.  UA without signs of infection.  Consider cannabinoid hyperemesis, she reports that her testing for gastroparesis was negative, consider chronic pain//opiate withdrawal.   On my reevaluation, she reports continued nausea and vomiting despite use of antiemetics in the emergency department.  She did have a similar presentation at the end of June for which she was admitted for intractable vomiting, had findings concerning for possible colitis on CT, and had urinary retention for which a Foley catheter was placed and removed prior to discharge.  Will plan to provide additional antiemetics and p.o. challenge.  Able to tolerate po fluids following phenergan .  Given rx for po phenergan .  Recommend follow up with PCP, GI.  Patient discharged in stable condition with understanding of reasons to return.       Dreama Longs, MD 06/25/24 2040

## 2024-06-25 NOTE — ED Provider Notes (Signed)
 [place] EMERGENCY DEPARTMENT AT [facility] Provider Note   CSN: [id number] Arrival date & time: 06/25/24  9864     Patient presents with: Abdominal Pain and Nausea   [name] [name] is a 63 y.o. female.   HPI     This is a 63 year old female with a history of CHF, chronic abdominal pain, hypertension who presents with abdominal pain, nausea, vomiting.  Onset of symptoms around midnight.  Reports periumbilical sharp abdominal pain.  States that she has felt symptoms like this in the past and was diagnosed with colitis.  Not had any fevers.  Does report nonbilious, nonbloody emesis.  No diarrhea.  Denies fevers or urinary symptoms.  Prior to Admission medications   Medication Sig Start Date End Date Taking? Authorizing Provider  acetaminophen  (TYLENOL ) 650 MG suppository Place 1 suppository (650 mg total) rectally every 6 (six) hours as needed for mild pain (pain score 1-3) or fever (or Fever >/= 101). 06/06/24   Will Almarie MATSU, MD  aspirin  EC 81 MG tablet Take 81 mg by mouth daily. Swallow whole.    [provider]  azelastine  (ASTELIN ) 0.1 % nasal spray Place 1 spray into both nostrils 2 (two) times daily as needed for rhinitis or allergies. 11/04/23   [provider]  BAQSIMI ONE PACK 3 MG/DOSE POWD Place 3 mg into the nose as needed (severe low blood sugar). 01/30/24   [provider]  Continuous Glucose Sensor (DEXCOM G7 SENSOR) MISC Inject 1 each into the skin See admin instructions. Place 1 new sensor into the skin every 10 days 01/30/24   [provider]  gabapentin  (NEURONTIN ) 100 MG capsule Take 100 mg by mouth at bedtime.    [provider]  Glucagon, rDNA, (GLUCAGON EMERGENCY) 1 MG KIT Inject 1 mg into the skin See admin instructions. Inject one mg into the skin see administration instructions. Follow package directions for low blood sugar. 06/18/23   [provider]  Insulin  Disposable Pump (OMNIPOD 5 G7  PODS, GEN 5,) MISC Inject 1 Device into the skin every 3 (three) days.    [provider]  levothyroxine  (SYNTHROID ) 150 MCG tablet Take 1 tablet (150 mcg total) by mouth daily before breakfast. 06/01/24   [name], [name], MD  midodrine  (PROAMATINE ) 5 MG tablet Take 5 mg by mouth 3 (three) times daily with meals.    [provider]  NOVOLOG  FLEXPEN 100 UNIT/ML FlexPen Inject 10 Units into the skin 3 (three) times daily with meals. Plus sliding scale TID Patient taking differently: Inject 10 Units into the skin See admin instructions. Inject 10 units into the skin three times a day with meals, PLUS sliding scale- ONLY WHEN NOT USING THE OMNIPOD 12/08/23   [name], MD  omega-3 acid ethyl esters (LOVAZA ) 1 g capsule Take 2 capsules (2 g total) by mouth 2 (two) times daily. 09/13/23   Will Almarie MATSU, MD  ondansetron  (ZOFRAN ) 4 MG tablet Take 2 tablets (8 mg total) by mouth every 8 (eight) hours as needed for nausea or vomiting. 05/31/24   [name], [name], MD  oxyCODONE  (OXY IR/ROXICODONE ) 5 MG immediate release tablet Take 1 tablet (5 mg total) by mouth every 6 (six) hours as needed for moderate pain (pain score 4-6) or severe pain (pain score 7-10). 05/31/24   [name], [name], MD  pantoprazole  (PROTONIX ) 40 MG tablet Take 1 tablet (40 mg total) by mouth 2 (two) times daily. Take 30 minutes before breakfast and  dinner Patient taking differently: Take 40 mg by mouth See admin instructions. Take 40 mg by mouth 30 minutes before breakfast and supper 01/16/24   [name], [name], NP  promethazine  (PHENERGAN ) 25 MG suppository Place 25 mg rectally every 6 (six) hours as needed for nausea or vomiting.    [provider]  sucralfate  (CARAFATE ) 1 g tablet Take 1 tablet (1 g total) by mouth 2 (two) times daily. Do not take any other medication within 2-4 hours. Patient not taking: Reported on 06/04/2024 12/25/23   Mansouraty, [name], MD  tiZANidine (ZANAFLEX) 4 MG  tablet Take 4 mg by mouth every 8 (eight) hours as needed for muscle spasms. 05/27/24 05/27/25  [provider]  TOUJEO  SOLOSTAR 300 UNIT/ML Solostar Pen Inject 25 Units into the skin at bedtime. Patient taking differently: Inject 25 Units into the skin See admin instructions. Inject 25 units into the skin at bedtime ONLY WHEN NOT USING THE OMNIPOD 12/08/23   [name], MD    Allergies: Codeine, Ciprofloxacin , and Flagyl  [metronidazole ]    Review of Systems  Constitutional:  Negative for fever.  Respiratory:  Negative for shortness of breath.   Cardiovascular:  Negative for chest pain.  Gastrointestinal:  Positive for abdominal pain, nausea and vomiting. Negative for diarrhea.  All other systems reviewed and are negative.   Updated Vital Signs BP (!) 146/88 (BP Location: Right Arm)   Pulse 97   Temp 97.7 F (36.5 C)   Resp 16   Ht 1.499 m (4' 11.02)   Wt 54.4 kg   LMP 08/11/2012   SpO2 97%   BMI 24.21 kg/m   Physical Exam Vitals and nursing note reviewed.  Constitutional:      Appearance: She is well-developed. She is not ill-appearing.  HENT:     Head: Normocephalic and atraumatic.  Eyes:     Pupils: Pupils are equal, round, and reactive to light.  Cardiovascular:     Rate and Rhythm: Normal rate and regular rhythm.     Heart sounds: Normal heart sounds.  Pulmonary:     Effort: Pulmonary effort is normal. No respiratory distress.     Breath sounds: No wheezing.  Abdominal:     General: Bowel sounds are normal.     Palpations: Abdomen is soft.     Tenderness: There is abdominal tenderness in the periumbilical area. There is guarding. There is no rebound.  Musculoskeletal:     Cervical back: Neck supple.  Skin:    General: Skin is warm and dry.  Neurological:     Mental Status: She is alert and oriented to person, place, and time.  Psychiatric:        Mood and Affect: Mood normal.     (all labs ordered are listed, but only abnormal results  are displayed) Labs Reviewed  CBC WITH DIFFERENTIAL/PLATELET - Abnormal; Notable for the following components:      Result Value   RBC 5.56 (*)    Hemoglobin 15.8 (*)    HCT 47.5 (*)    RDW 16.0 (*)    Platelets 423 (*)    All other components within normal limits  COMPREHENSIVE METABOLIC PANEL WITH GFR - Abnormal; Notable for the following components:   Chloride 97 (*)    Glucose, Bld 232 (*)    Total Protein 8.8 (*)    Total Bilirubin 1.3 (*)    All other components within normal limits  LIPASE, BLOOD - Abnormal; Notable for the following components:  Lipase 57 (*)    All other components within normal limits  C DIFFICILE QUICK SCREEN W PCR REFLEX    URINALYSIS, ROUTINE W REFLEX MICROSCOPIC    EKG: None  Radiology: CT ABDOMEN PELVIS W CONTRAST Result Date: 06/25/2024 CLINICAL DATA:  Acute abdominal pain. Nausea and vomiting with tenderness and guarding for 2 weeks. EXAM: CT ABDOMEN AND PELVIS WITH CONTRAST TECHNIQUE: Multidetector CT imaging of the abdomen and pelvis was performed using the standard protocol following bolus administration of intravenous contrast. RADIATION DOSE REDUCTION: This exam was performed according to the departmental dose-optimization program which includes automated exposure control, adjustment of the mA and/or kV according to patient size and/or use of iterative reconstruction technique. CONTRAST:  OMNIPAQUE  IOHEXOL  300 MG/ML  SOLN COMPARISON:  06/04/2024 FINDINGS: Lower chest: No acute abnormality. Hepatobiliary: No focal liver abnormality. Cholecystectomy. Unchanged dilatation of the common bile duct which measures 9 mm. Mild intrahepatic biliary dilatation is also similar. No calcified stones noted along the course of the CBD. Pancreas: Unremarkable. No pancreatic ductal dilatation or surrounding inflammatory changes. Spleen: Normal in size without focal abnormality. Adrenals/Urinary Tract: Normal adrenal glands. No nephrolithiasis, hydronephrosis or  mass. Moderate distension of the urinary bladder without focal abnormality. Stomach/Bowel: Moderate distension of the stomach. No pathologic dilatation of the bowel loops. The appendix is visualized and appears normal. Mild diffuse wall thickening of the colon with mucosal enhancement is identified. Scattered colonic diverticula noted without signs of acute diverticulitis. Vascular/Lymphatic: Aortic atherosclerosis. No enlarged abdominal or pelvic lymph nodes. Reproductive: Status post hysterectomy. No adnexal masses. Other: No significant free fluid or fluid collections. Small fat containing umbilical hernia. Musculoskeletal: No acute or significant osseous findings. IMPRESSION: 1. Mild diffuse wall thickening of the colon with mucosal enhancement is identified. Imaging findings are concerning for colitis. Clinical correlation advised. 2. Colonic diverticulosis without signs of acute diverticulitis. 3. Unchanged dilatation of the common bile duct and intrahepatic biliary tree. In the absence of clinical signs or symptoms of biliary obstruction these findings are likely related to prior cholecystectomy. 4.  Aortic Atherosclerosis (ICD10-I70.0). Electronically Signed   By: Waddell Calk M.D.   On: 06/25/2024 06:05     Procedures   Medications Ordered in the ED  oxyCODONE -acetaminophen  (PERCOCET/ROXICET) 5-325 MG per tablet 1 tablet (has no administration in time range)  morphine  (PF) 4 MG/ML injection 4 mg (4 mg Intravenous Given 06/25/24 0336)  ondansetron  (ZOFRAN ) injection 4 mg (4 mg Intravenous Given 06/25/24 0336)  iohexol  (OMNIPAQUE ) 300 MG/ML solution 100 mL (100 mLs Intravenous Contrast Given 06/25/24 0444)  ondansetron  (ZOFRAN ) injection 4 mg (4 mg Intravenous Given 06/25/24 0656)  pantoprazole  (PROTONIX ) injection 40 mg (40 mg Intravenous Given 06/25/24 0656)    Clinical Course as of 06/25/24 0716  Thu Jun 25, 2024  0620 Patient reports some improvement.  Bladder is significantly distended on  CT.  States that she does not feel I need to go and has to have difficulty going.  Will place Foley. [CH]  0643 CT shows some mild changes potentially consistent with colitis.  However, patient has not had any diarrhea and has no white count. [CH]    Clinical Course User Index [CH] Jove Beyl, Charmaine FALCON, MD                                 Medical Decision Making Amount and/or Complexity of Data Reviewed Labs: ordered. Radiology: ordered.  Risk Prescription drug management.  This patient presents to the ED for concern of abdominal pain, vomiting, this involves an extensive number of treatment options, and is a complaint that carries with it a high risk of complications and morbidity.  I considered the following differential and admission for this acute, potentially life threatening condition.  The differential diagnosis includes gastritis, gastroenteritis, colitis, SBO  MDM:    This is a 63 year old female with history of chronic abdominal pain who presents with abdominal pain.  She is nontoxic and vital signs are reassuring.  She has some periumbilical tenderness to palpation.  No signs of peritonitis.  No diarrhea.  No fevers.  Labs obtained.  No leukocytosis.  She is hyperglycemic without evidence of anion gap.  CT scan shows some changes that may be consistent with colitis; however, she clinically does not have colitis.  I did note that her bladder on CT was distended.  She states that she is unable to urinate.  Foley catheter was placed and urinalysis is pending.  (Labs, imaging, consults)  Labs: I Ordered, and personally interpreted labs.  The pertinent results include: CBC, CMP, lipase, urinalysis  Imaging Studies ordered: I ordered imaging studies including CT I independently visualized and interpreted imaging. I agree with the radiologist interpretation  Additional history obtained from chart review.  External records from outside source obtained and reviewed including prior  evaluations  Cardiac Monitoring: The patient was maintained on a cardiac monitor.  If on the cardiac monitor, I personally viewed and interpreted the cardiac monitored which showed an underlying rhythm of: Sinus  Reevaluation: After the interventions noted above, I reevaluated the patient and found that they have :stayed the same  Social Determinants of Health:  lives independently  Disposition: Pending  Co morbidities that complicate the patient evaluation  Past Medical History:  Diagnosis Date   Allergy    seasonal   Anxiety    B12 deficiency    Chest pain 04/05/2017   CHF (congestive heart failure) ([name])    Diabetes mellitus type 1, uncontrolled    dr. 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) 10/29/2013   Leg cramping    @ night (09/18/2013)   Migraine    once q couple months (11/26/2018)   Osteoporosis    Peripheral neuropathy    Umbilical hernia    unrepaired (09/18/2013)     Medicines Meds ordered this encounter  Medications   morphine  (PF) 4 MG/ML injection 4 mg   ondansetron  (ZOFRAN ) injection 4 mg   iohexol  (OMNIPAQUE ) 300 MG/ML solution 100 mL   DISCONTD: amoxicillin -clavulanate (AUGMENTIN ) 875-125 MG per tablet 1 tablet   ondansetron  (ZOFRAN ) injection 4 mg   oxyCODONE -acetaminophen  (PERCOCET/ROXICET) 5-325 MG per tablet 1 tablet    Refill:  0   pantoprazole  (PROTONIX ) injection 40 mg    I have reviewed the patients home medicines and have made adjustments as needed  Problem List / ED Course: Problem List Items Addressed This Visit   None            Final diagnoses:  None    ED Discharge Orders     None          Bari Charmaine FALCON, MD 06/25/24 [phone]

## 2024-06-25 NOTE — ED Notes (Signed)
 IV attempt x 2 unsuccessful.

## 2024-06-25 NOTE — ED Triage Notes (Signed)
 Pt BIBA from home, c/o abd pain, nausea and vomiting, tenderness with guarding for 2 weeks.  Abd pain generalized. Has not vomited with EMS. Hx of colitis.   CBG 212

## 2024-06-25 NOTE — ED Notes (Signed)
Patient continues to vomit

## 2024-06-26 ENCOUNTER — Emergency Department (HOSPITAL_BASED_OUTPATIENT_CLINIC_OR_DEPARTMENT_OTHER)
Admission: EM | Admit: 2024-06-26 | Discharge: 2024-06-27 | Disposition: A | Discharge status: Home / Self Care | Attending: Emergency Medicine | Admitting: Emergency Medicine

## 2024-06-26 ENCOUNTER — Other Ambulatory Visit: Payer: Self-pay

## 2024-06-26 DIAGNOSIS — R109 Unspecified abdominal pain: Secondary | ICD-10-CM | POA: Insufficient documentation

## 2024-06-26 DIAGNOSIS — G8929 Other chronic pain: Secondary | ICD-10-CM | POA: Insufficient documentation

## 2024-06-26 DIAGNOSIS — Z794 Long term (current) use of insulin: Secondary | ICD-10-CM | POA: Insufficient documentation

## 2024-06-26 DIAGNOSIS — R112 Nausea with vomiting, unspecified: Secondary | ICD-10-CM | POA: Insufficient documentation

## 2024-06-26 DIAGNOSIS — Z7982 Long term (current) use of aspirin: Secondary | ICD-10-CM | POA: Diagnosis not present

## 2024-06-26 LAB — CBC
HCT: 47.1 % — ABNORMAL HIGH (ref 36.0–46.0)
Hemoglobin: 15.8 g/dL — ABNORMAL HIGH (ref 12.0–15.0)
MCH: 28.5 pg (ref 26.0–34.0)
MCHC: 33.5 g/dL (ref 30.0–36.0)
MCV: 84.9 fL (ref 80.0–100.0)
Platelets: 383 K/uL (ref 150–400)
RBC: 5.55 MIL/uL — ABNORMAL HIGH (ref 3.87–5.11)
RDW: 16 % — ABNORMAL HIGH (ref 11.5–15.5)
WBC: 9.5 K/uL (ref 4.0–10.5)
nRBC: 0 % (ref 0.0–0.2)

## 2024-06-26 NOTE — ED Triage Notes (Signed)
 Pt arrives via [facility] coming for home c/o abdominal pain. Pt reports recurrent abdominal pain that restarted today. Associated with ND, no vomiting. Was seen at [facility] yesterday for same s/s yesterday. No meds taken today. Sts she was unable to pick up prescribed medication - phenergan  - d/t husband being in the hospital.   EMS VS CBG 187, BP 114/70, HR 98, and 100 RA

## 2024-06-27 LAB — COMPREHENSIVE METABOLIC PANEL WITH GFR
ALT: 13 U/L (ref 0–44)
AST: 21 U/L (ref 15–41)
Albumin: 4.6 g/dL (ref 3.5–5.0)
Alkaline Phosphatase: 94 U/L (ref 38–126)
Anion gap: 15 (ref 5–15)
BUN: 30 mg/dL — ABNORMAL HIGH (ref 6–20)
CO2: 27 mmol/L (ref 22–32)
Calcium: 9.6 mg/dL (ref 8.9–10.3)
Chloride: 95 mmol/L — ABNORMAL LOW (ref 98–111)
Creatinine, Ser: 1.07 mg/dL — ABNORMAL HIGH (ref 0.44–1.00)
GFR, Estimated: 59 mL/min — ABNORMAL LOW
Glucose, Bld: 185 mg/dL — ABNORMAL HIGH (ref 70–99)
Potassium: 4.6 mmol/L (ref 3.5–5.1)
Sodium: 137 mmol/L (ref 135–145)
Total Bilirubin: 0.6 mg/dL (ref 0.0–1.2)
Total Protein: 7.9 g/dL (ref 6.5–8.1)

## 2024-06-27 LAB — LIPASE, BLOOD: Lipase: 66 U/L — ABNORMAL HIGH (ref 11–51)

## 2024-06-27 MED ORDER — DROPERIDOL 2.5 MG/ML IJ SOLN
1.2500 mg | Freq: Once | INTRAMUSCULAR | Status: AC
  Administered 2024-06-27: 1.25 mg via INTRAVENOUS
  Filled 2024-06-27: qty 2

## 2024-06-27 MED ORDER — LACTATED RINGERS IV BOLUS
1000.0000 mL | Freq: Once | INTRAVENOUS | Status: AC
  Administered 2024-06-27: 1000 mL via INTRAVENOUS

## 2024-06-27 NOTE — ED Notes (Signed)
 Dc instructions given, pt verbalized understanding. Pt out of ED with steady gait with all belongings and paperwork, not in visible distress.

## 2024-06-27 NOTE — ED Provider Notes (Signed)
 [place] EMERGENCY DEPARTMENT AT [facility]  Provider Note  CSN: [id number] Arrival date & time: 06/26/24 2307  History Chief Complaint  Patient presents with   Abdominal Pain    [name] [name] is a 63 y.o. female with history of chronic abdominal pain and frequent ED visits for same was seen at [facility] for same yesterday, better at discharge but symptoms returned tonight prompting EMS to bring her to the ED. She was unable to get Rx for Phenergan  filled from ED visit due to being with her husband at his doctor's appointments. She has been evaluated for gastroparesis and was negative. She continues to smoke marijuana despite being advised it may be contributing to her symptoms. She denies fever.    Home Medications Prior to Admission medications   Medication Sig Start Date End Date Taking? Authorizing Provider  acetaminophen  (TYLENOL ) 650 MG suppository Place 1 suppository (650 mg total) rectally every 6 (six) hours as needed for mild pain (pain score 1-3) or fever (or Fever >/= 101). 06/06/24   Will Almarie MATSU, MD  aspirin  EC 81 MG tablet Take 81 mg by mouth daily. Swallow whole.    [provider]  azelastine  (ASTELIN ) 0.1 % nasal spray Place 1 spray into both nostrils 2 (two) times daily as needed for rhinitis or allergies. 11/04/23   [provider]  BAQSIMI ONE PACK 3 MG/DOSE POWD Place 3 mg into the nose as needed (severe low blood sugar). 01/30/24   [provider]  Continuous Glucose Sensor (DEXCOM G7 SENSOR) MISC Inject 1 each into the skin See admin instructions. Place 1 new sensor into the skin every 10 days 01/30/24   [provider]  gabapentin  (NEURONTIN ) 100 MG capsule Take 100 mg by mouth at bedtime.    [provider]  Glucagon, rDNA, (GLUCAGON EMERGENCY) 1 MG KIT Inject 1 mg into the skin See admin instructions. Inject one mg into the skin see administration instructions. Follow package directions for low blood sugar.  06/18/23   [provider]  Insulin  Disposable Pump (OMNIPOD 5 G7 PODS, GEN 5,) MISC Inject 1 Device into the skin every 3 (three) days.    [provider]  levothyroxine  (SYNTHROID ) 150 MCG tablet Take 1 tablet (150 mcg total) by mouth daily before breakfast. 06/01/24   [name], [name], MD  midodrine  (PROAMATINE ) 5 MG tablet Take 5 mg by mouth 3 (three) times daily with meals.    [provider]  NOVOLOG  FLEXPEN 100 UNIT/ML FlexPen Inject 10 Units into the skin 3 (three) times daily with meals. Plus sliding scale TID Patient taking differently: Inject 10 Units into the skin See admin instructions. Inject 10 units into the skin three times a day with meals, PLUS sliding scale- ONLY WHEN NOT USING THE OMNIPOD 12/08/23   [name], MD  omega-3 acid ethyl esters (LOVAZA ) 1 g capsule Take 2 capsules (2 g total) by mouth 2 (two) times daily. 09/13/23   Will Almarie MATSU, MD  ondansetron  (ZOFRAN ) 4 MG tablet Take 2 tablets (8 mg total) by mouth every 8 (eight) hours as needed for nausea or vomiting. 05/31/24   [name], [name], MD  oxyCODONE  (OXY IR/ROXICODONE ) 5 MG immediate release tablet Take 1 tablet (5 mg total) by mouth every 6 (six) hours as needed for moderate pain (pain score 4-6) or severe pain (pain score 7-10). 05/31/24   [name], [name], MD  pantoprazole  (PROTONIX ) 40 MG tablet Take 1 tablet (40 mg total) by  mouth 2 (two) times daily. Take 30 minutes before breakfast and dinner Patient taking differently: Take 40 mg by mouth See admin instructions. Take 40 mg by mouth 30 minutes before breakfast and supper 01/16/24   [name], [name], NP  promethazine  (PHENERGAN ) 25 MG suppository Place 25 mg rectally every 6 (six) hours as needed for nausea or vomiting.    [provider]  promethazine  (PHENERGAN ) 25 MG tablet Take 1 tablet (25 mg total) by mouth every 6 (six) hours as needed for nausea or vomiting (to take instead of rectal suppository). 06/25/24    Dreama Longs, MD  sucralfate  (CARAFATE ) 1 g tablet Take 1 tablet (1 g total) by mouth 2 (two) times daily. Do not take any other medication within 2-4 hours. Patient not taking: Reported on 06/04/2024 12/25/23   Mansouraty, [name], MD  tiZANidine (ZANAFLEX) 4 MG tablet Take 4 mg by mouth every 8 (eight) hours as needed for muscle spasms. 05/27/24 05/27/25  [provider]  TOUJEO  SOLOSTAR 300 UNIT/ML Solostar Pen Inject 25 Units into the skin at bedtime. Patient taking differently: Inject 25 Units into the skin See admin instructions. Inject 25 units into the skin at bedtime ONLY WHEN NOT USING THE OMNIPOD 12/08/23   [name], MD     Allergies    Codeine, Ciprofloxacin , and Flagyl  [metronidazole ]   Review of Systems   Review of Systems Please see HPI for pertinent positives and negatives  Physical Exam BP 107/75   Pulse 98   Temp 98 F (36.7 C) (Oral)   Resp (!) 21   LMP 08/11/2012   SpO2 99%   Physical Exam Vitals and nursing note reviewed.  Constitutional:      Appearance: Normal appearance.  HENT:     Head: Normocephalic and atraumatic.     Nose: Nose normal.     Mouth/Throat:     Mouth: Mucous membranes are moist.  Eyes:     Extraocular Movements: Extraocular movements intact.     Conjunctiva/sclera: Conjunctivae normal.  Cardiovascular:     Rate and Rhythm: Normal rate.  Pulmonary:     Effort: Pulmonary effort is normal.     Breath sounds: Normal breath sounds.  Abdominal:     General: Abdomen is flat.     Palpations: Abdomen is soft.     Tenderness: There is generalized abdominal tenderness. There is no guarding. Negative signs include Murphy's sign and McBurney's sign.  Musculoskeletal:        General: No swelling. Normal range of motion.     Cervical back: Neck supple.  Skin:    General: Skin is warm and dry.  Neurological:     General: No focal deficit present.     Mental Status: She is alert.  Psychiatric:        Mood and  Affect: Mood normal.     ED Results / Procedures / Treatments   EKG None  Procedures Procedures  Medications Ordered in the ED Medications  droperidol  (INAPSINE ) 2.5 MG/ML injection 1.25 mg (1.25 mg Intravenous Given 06/27/24 0143)  lactated ringers  bolus 1,000 mL (1,000 mLs Intravenous New Bag/Given 06/27/24 0146)    Initial Impression and Plan  Patient here with recurrence of chronic abdominal pain. Exam and vitals are reassuring. Labs done in triage show unchanged CBC, CMP and Lipase. She has had prior cholecystectomy. She has had a total of 12 CT Abd/Pel so far this year at various area ED's including one yesterday which was unchanged from prior.  Will give a dose of droperidol , IVF and reassess.   ED Course   Clinical Course as of 06/27/24 0310  Sat Jun 27, 2024  0309 Patient sleeping soundly on re-evaluation. She has not vomited since arrival. Recommend she stop using THC. She already has Rx for phenergan  available for pick up. Recommend PCP follow up, RTED for any other concerns.   [CS]    Clinical Course User Index [CS] Roselyn Carlin NOVAK, MD     MDM Rules/Calculators/A&P Medical Decision Making Problems Addressed: Chronic abdominal pain: chronic illness or injury with exacerbation, progression, or side effects of treatment Nausea and vomiting, unspecified vomiting type: chronic illness or injury with exacerbation, progression, or side effects of treatment  Amount and/or Complexity of Data Reviewed Labs: ordered. Decision-making details documented in ED Course.  Risk Prescription drug management.     Final Clinical Impression(s) / ED Diagnoses Final diagnoses:  Nausea and vomiting, unspecified vomiting type  Chronic abdominal pain    Rx / DC Orders ED Discharge Orders     None        Roselyn Carlin NOVAK, MD 06/27/24 [phone]

## 2024-07-18 ENCOUNTER — Inpatient Hospital Stay (HOSPITAL_COMMUNITY)
Admission: EM | Admit: 2024-07-18 | Discharge: 2024-07-21 | DRG: 690 | Disposition: A | Discharge status: Home / Self Care | Attending: Internal Medicine | Admitting: Internal Medicine

## 2024-07-18 ENCOUNTER — Emergency Department (HOSPITAL_COMMUNITY)

## 2024-07-18 ENCOUNTER — Other Ambulatory Visit: Payer: Self-pay

## 2024-07-18 ENCOUNTER — Encounter (HOSPITAL_COMMUNITY): Payer: Self-pay

## 2024-07-18 DIAGNOSIS — R109 Unspecified abdominal pain: Secondary | ICD-10-CM | POA: Diagnosis not present

## 2024-07-18 DIAGNOSIS — Z9851 Tubal ligation status: Secondary | ICD-10-CM

## 2024-07-18 DIAGNOSIS — Z8249 Family history of ischemic heart disease and other diseases of the circulatory system: Secondary | ICD-10-CM

## 2024-07-18 DIAGNOSIS — R110 Nausea: Secondary | ICD-10-CM | POA: Diagnosis present

## 2024-07-18 DIAGNOSIS — Z79890 Hormone replacement therapy (postmenopausal): Secondary | ICD-10-CM

## 2024-07-18 DIAGNOSIS — Z90710 Acquired absence of both cervix and uterus: Secondary | ICD-10-CM

## 2024-07-18 DIAGNOSIS — E1065 Type 1 diabetes mellitus with hyperglycemia: Secondary | ICD-10-CM | POA: Diagnosis present

## 2024-07-18 DIAGNOSIS — E1043 Type 1 diabetes mellitus with diabetic autonomic (poly)neuropathy: Secondary | ICD-10-CM | POA: Diagnosis present

## 2024-07-18 DIAGNOSIS — E785 Hyperlipidemia, unspecified: Secondary | ICD-10-CM | POA: Diagnosis present

## 2024-07-18 DIAGNOSIS — N390 Urinary tract infection, site not specified: Secondary | ICD-10-CM | POA: Diagnosis not present

## 2024-07-18 DIAGNOSIS — D751 Secondary polycythemia: Secondary | ICD-10-CM | POA: Diagnosis present

## 2024-07-18 DIAGNOSIS — K2970 Gastritis, unspecified, without bleeding: Secondary | ICD-10-CM | POA: Diagnosis present

## 2024-07-18 DIAGNOSIS — N3090 Cystitis, unspecified without hematuria: Principal | ICD-10-CM | POA: Diagnosis present

## 2024-07-18 DIAGNOSIS — M810 Age-related osteoporosis without current pathological fracture: Secondary | ICD-10-CM | POA: Diagnosis present

## 2024-07-18 DIAGNOSIS — Z9641 Presence of insulin pump (external) (internal): Secondary | ICD-10-CM | POA: Diagnosis present

## 2024-07-18 DIAGNOSIS — Z79899 Other long term (current) drug therapy: Secondary | ICD-10-CM

## 2024-07-18 DIAGNOSIS — Z883 Allergy status to other anti-infective agents status: Secondary | ICD-10-CM

## 2024-07-18 DIAGNOSIS — Z87891 Personal history of nicotine dependence: Secondary | ICD-10-CM

## 2024-07-18 DIAGNOSIS — I2510 Atherosclerotic heart disease of native coronary artery without angina pectoris: Secondary | ICD-10-CM | POA: Diagnosis present

## 2024-07-18 DIAGNOSIS — E039 Hypothyroidism, unspecified: Secondary | ICD-10-CM | POA: Diagnosis present

## 2024-07-18 DIAGNOSIS — I130 Hypertensive heart and chronic kidney disease with heart failure and stage 1 through stage 4 chronic kidney disease, or unspecified chronic kidney disease: Secondary | ICD-10-CM | POA: Diagnosis present

## 2024-07-18 DIAGNOSIS — J302 Other seasonal allergic rhinitis: Secondary | ICD-10-CM | POA: Diagnosis present

## 2024-07-18 DIAGNOSIS — N182 Chronic kidney disease, stage 2 (mild): Secondary | ICD-10-CM | POA: Diagnosis present

## 2024-07-18 DIAGNOSIS — K2990 Gastroduodenitis, unspecified, without bleeding: Secondary | ICD-10-CM | POA: Diagnosis present

## 2024-07-18 DIAGNOSIS — B9620 Unspecified Escherichia coli [E. coli] as the cause of diseases classified elsewhere: Secondary | ICD-10-CM | POA: Diagnosis present

## 2024-07-18 DIAGNOSIS — Z885 Allergy status to narcotic agent status: Secondary | ICD-10-CM

## 2024-07-18 DIAGNOSIS — E1022 Type 1 diabetes mellitus with diabetic chronic kidney disease: Secondary | ICD-10-CM | POA: Diagnosis present

## 2024-07-18 DIAGNOSIS — E1042 Type 1 diabetes mellitus with diabetic polyneuropathy: Secondary | ICD-10-CM | POA: Diagnosis present

## 2024-07-18 DIAGNOSIS — I5032 Chronic diastolic (congestive) heart failure: Secondary | ICD-10-CM | POA: Diagnosis present

## 2024-07-18 DIAGNOSIS — Z881 Allergy status to other antibiotic agents status: Secondary | ICD-10-CM

## 2024-07-18 DIAGNOSIS — K219 Gastro-esophageal reflux disease without esophagitis: Secondary | ICD-10-CM | POA: Diagnosis present

## 2024-07-18 DIAGNOSIS — K529 Noninfective gastroenteritis and colitis, unspecified: Principal | ICD-10-CM | POA: Diagnosis present

## 2024-07-18 DIAGNOSIS — E109 Type 1 diabetes mellitus without complications: Secondary | ICD-10-CM | POA: Diagnosis present

## 2024-07-18 DIAGNOSIS — Z794 Long term (current) use of insulin: Secondary | ICD-10-CM

## 2024-07-18 DIAGNOSIS — Z7982 Long term (current) use of aspirin: Secondary | ICD-10-CM

## 2024-07-18 DIAGNOSIS — K3184 Gastroparesis: Secondary | ICD-10-CM | POA: Diagnosis present

## 2024-07-18 DIAGNOSIS — K5900 Constipation, unspecified: Secondary | ICD-10-CM | POA: Diagnosis present

## 2024-07-18 DIAGNOSIS — I480 Paroxysmal atrial fibrillation: Secondary | ICD-10-CM | POA: Diagnosis present

## 2024-07-18 DIAGNOSIS — F1210 Cannabis abuse, uncomplicated: Secondary | ICD-10-CM | POA: Diagnosis present

## 2024-07-18 DIAGNOSIS — Z833 Family history of diabetes mellitus: Secondary | ICD-10-CM

## 2024-07-18 LAB — URINALYSIS, ROUTINE W REFLEX MICROSCOPIC
Bilirubin Urine: NEGATIVE
Glucose, UA: NEGATIVE mg/dL
Hgb urine dipstick: NEGATIVE
Ketones, ur: NEGATIVE mg/dL
Nitrite: NEGATIVE
Protein, ur: NEGATIVE mg/dL
Specific Gravity, Urine: 1.017 (ref 1.005–1.030)
WBC, UA: >50 WBC/hpf (ref 0–5)
pH: 7 (ref 5.0–8.0)

## 2024-07-18 LAB — CBC WITH DIFFERENTIAL/PLATELET
Abs Immature Granulocytes: 0.03 K/uL (ref 0.00–0.07)
Basophils Absolute: 0 K/uL (ref 0.0–0.1)
Basophils Relative: 0 %
Eosinophils Absolute: 0.1 K/uL (ref 0.0–0.5)
Eosinophils Relative: 1 %
HCT: 50.9 % — ABNORMAL HIGH (ref 36.0–46.0)
Hemoglobin: 15.9 g/dL — ABNORMAL HIGH (ref 12.0–15.0)
Immature Granulocytes: 0 %
Lymphocytes Relative: 22 %
Lymphs Abs: 2.1 K/uL (ref 0.7–4.0)
MCH: 27.6 pg (ref 26.0–34.0)
MCHC: 31.2 g/dL (ref 30.0–36.0)
MCV: 88.2 fL (ref 80.0–100.0)
Monocytes Absolute: 0.5 K/uL (ref 0.1–1.0)
Monocytes Relative: 5 %
Neutro Abs: 6.6 K/uL (ref 1.7–7.7)
Neutrophils Relative %: 72 %
Platelets: 373 K/uL (ref 150–400)
RBC: 5.77 MIL/uL — ABNORMAL HIGH (ref 3.87–5.11)
RDW: 16.4 % — ABNORMAL HIGH (ref 11.5–15.5)
WBC: 9.3 K/uL (ref 4.0–10.5)
nRBC: 0 % (ref 0.0–0.2)

## 2024-07-18 LAB — GLUCOSE, CAPILLARY
Glucose-Capillary: 106 mg/dL — ABNORMAL HIGH (ref 70–99)
Glucose-Capillary: 111 mg/dL — ABNORMAL HIGH (ref 70–99)
Glucose-Capillary: 135 mg/dL — ABNORMAL HIGH (ref 70–99)
Glucose-Capillary: 98 mg/dL (ref 70–99)

## 2024-07-18 LAB — COMPREHENSIVE METABOLIC PANEL WITH GFR
ALT: 15 U/L (ref 0–44)
AST: 17 U/L (ref 15–41)
Albumin: 3.8 g/dL (ref 3.5–5.0)
Alkaline Phosphatase: 72 U/L (ref 38–126)
Anion gap: 12 (ref 5–15)
BUN: 20 mg/dL (ref 8–23)
CO2: 26 mmol/L (ref 22–32)
Calcium: 8.7 mg/dL — ABNORMAL LOW (ref 8.9–10.3)
Chloride: 101 mmol/L (ref 98–111)
Creatinine, Ser: 0.65 mg/dL (ref 0.44–1.00)
GFR, Estimated: >60 mL/min (ref >60)
Glucose, Bld: 177 mg/dL — ABNORMAL HIGH (ref 70–99)
Potassium: 3.8 mmol/L (ref 3.5–5.1)
Sodium: 139 mmol/L (ref 135–145)
Total Bilirubin: 1 mg/dL (ref 0.0–1.2)
Total Protein: 7.5 g/dL (ref 6.5–8.1)

## 2024-07-18 LAB — LIPASE, BLOOD: Lipase: 26 U/L (ref 11–51)

## 2024-07-18 LAB — PHOSPHORUS: Phosphorus: 3.5 mg/dL (ref 2.5–4.6)

## 2024-07-18 LAB — MAGNESIUM: Magnesium: 2.6 mg/dL — ABNORMAL HIGH (ref 1.7–2.4)

## 2024-07-18 MED ORDER — SODIUM CHLORIDE 0.9 % IV BOLUS
1000.0000 mL | Freq: Once | INTRAVENOUS | Status: AC
  Administered 2024-07-18: 1000 mL via INTRAVENOUS

## 2024-07-18 MED ORDER — MORPHINE SULFATE (PF) 4 MG/ML IV SOLN
4.0000 mg | Freq: Once | INTRAVENOUS | Status: AC
  Administered 2024-07-18: 4 mg via INTRAVENOUS
  Filled 2024-07-18: qty 1

## 2024-07-18 MED ORDER — PROCHLORPERAZINE EDISYLATE 10 MG/2ML IJ SOLN: 5.0000 mg | INTRAMUSCULAR | Status: DC | PRN

## 2024-07-18 MED ORDER — ONDANSETRON HCL 4 MG/2ML IJ SOLN
4.0000 mg | Freq: Once | INTRAMUSCULAR | Status: AC
  Administered 2024-07-18: 4 mg via INTRAVENOUS
  Filled 2024-07-18: qty 2

## 2024-07-18 MED ORDER — IOHEXOL 300 MG/ML  SOLN
100.0000 mL | Freq: Once | INTRAMUSCULAR | Status: AC | PRN
  Administered 2024-07-18: 100 mL via INTRAVENOUS

## 2024-07-18 MED ORDER — ONDANSETRON HCL 4 MG/2ML IJ SOLN: 4.0000 mg | Freq: Once | INTRAMUSCULAR | Status: DC

## 2024-07-18 MED ORDER — SODIUM CHLORIDE 0.9 % IV SOLN
1.0000 g | INTRAVENOUS | Status: DC
  Administered 2024-07-19 – 2024-07-20 (×3): 1 g via INTRAVENOUS
  Filled 2024-07-18 (×2): qty 10

## 2024-07-18 MED ORDER — INSULIN PUMP
SUBCUTANEOUS | Status: DC
  Filled 2024-07-18: qty 1

## 2024-07-18 MED ORDER — ENOXAPARIN SODIUM 40 MG/0.4ML IJ SOSY
40.0000 mg | PREFILLED_SYRINGE | INTRAMUSCULAR | Status: DC
  Administered 2024-07-18 – 2024-07-20 (×4): 40 mg via SUBCUTANEOUS
  Filled 2024-07-18 (×3): qty 0.4

## 2024-07-18 MED ORDER — SODIUM CHLORIDE 0.45 % IV SOLN: INTRAVENOUS | Status: AC

## 2024-07-18 MED ORDER — ONDANSETRON HCL 4 MG PO TABS
4.0000 mg | ORAL_TABLET | Freq: Four times a day (QID) | ORAL | Status: DC | PRN
Start: 2024-07-18 — End: 2024-07-21
  Administered 2024-07-19: 4 mg via ORAL
  Filled 2024-07-18: qty 1

## 2024-07-18 MED ORDER — DROPERIDOL 2.5 MG/ML IJ SOLN
1.2500 mg | Freq: Once | INTRAMUSCULAR | Status: AC
  Administered 2024-07-18: 1.25 mg via INTRAVENOUS
  Filled 2024-07-18: qty 2

## 2024-07-18 MED ORDER — ACETAMINOPHEN 325 MG PO TABS
650.0000 mg | ORAL_TABLET | Freq: Four times a day (QID) | ORAL | Status: DC | PRN
  Administered 2024-07-19 – 2024-07-21 (×5): 650 mg via ORAL
  Filled 2024-07-18 (×3): qty 2

## 2024-07-18 MED ORDER — ONDANSETRON HCL 4 MG/2ML IJ SOLN
4.0000 mg | Freq: Four times a day (QID) | INTRAMUSCULAR | Status: DC | PRN
  Administered 2024-07-18: 4 mg via INTRAVENOUS
  Filled 2024-07-18: qty 2

## 2024-07-18 MED ORDER — METOCLOPRAMIDE HCL 5 MG/ML IJ SOLN
5.0000 mg | Freq: Four times a day (QID) | INTRAMUSCULAR | Status: DC
  Administered 2024-07-18 – 2024-07-21 (×18): 5 mg via INTRAVENOUS
  Filled 2024-07-18 (×12): qty 2

## 2024-07-18 MED ORDER — SODIUM CHLORIDE 0.9 % IV SOLN
1.0000 g | Freq: Once | INTRAVENOUS | Status: AC
Start: 2024-07-18 — End: 2024-07-18
  Administered 2024-07-18: 1 g via INTRAVENOUS
  Filled 2024-07-18: qty 10

## 2024-07-18 MED ORDER — HYDROMORPHONE HCL 1 MG/ML IJ SOLN
0.5000 mg | INTRAMUSCULAR | Status: AC | PRN
  Administered 2024-07-18 – 2024-07-19 (×5): 0.5 mg via INTRAVENOUS
  Filled 2024-07-18 (×5): qty 0.5

## 2024-07-18 MED ORDER — ACETAMINOPHEN 650 MG RE SUPP: 650.0000 mg | Freq: Four times a day (QID) | RECTAL | Status: DC | PRN

## 2024-07-18 NOTE — ED Provider Notes (Signed)
 [place] EMERGENCY DEPARTMENT AT [facility] Provider Note   CSN: [id number] Arrival date & time: 07/18/24  9641     Patient presents with: Abdominal Pain   [name] [name] is a 63 y.o. female with history of DKA, chronic kidney disease, CHF, diabetes, GERD, hypertension, cyclic vomiting, cannabis use.  Patient presents to ED for evaluation of lower abdominal pain.  States for the last 2 days she has had progressively worsening lower abdominal pain.  She denies vomiting, diarrhea, fevers at home.  Denies dysuria, urgency, frequency, malodorous urine.  On chart review, patient has been advised in the past to discontinue use of marijuana as previous providers have told her that they feel as if it is contributing to her abdominal pain and nausea and vomiting.  She denies that she continues to use marijuana however does have a strong odor of marijuana.  She denies chest pain or shortness of breath.    Abdominal Pain Associated symptoms: nausea        Prior to Admission medications   Medication Sig Start Date End Date Taking? Authorizing Provider  acetaminophen  (TYLENOL ) 650 MG suppository Place 1 suppository (650 mg total) rectally every 6 (six) hours as needed for mild pain (pain score 1-3) or fever (or Fever >/= 101). 06/06/24   Will Almarie MATSU, MD  aspirin  EC 81 MG tablet Take 81 mg by mouth daily. Swallow whole.    [provider]  azelastine  (ASTELIN ) 0.1 % nasal spray Place 1 spray into both nostrils 2 (two) times daily as needed for rhinitis or allergies. 11/04/23   [provider]  BAQSIMI ONE PACK 3 MG/DOSE POWD Place 3 mg into the nose as needed (severe low blood sugar). 01/30/24   [provider]  Continuous Glucose Sensor (DEXCOM G7 SENSOR) MISC Inject 1 each into the skin See admin instructions. Place 1 new sensor into the skin every 10 days 01/30/24   [provider]  gabapentin  (NEURONTIN ) 100 MG capsule Take 100 mg by mouth at  bedtime.    [provider]  Glucagon, rDNA, (GLUCAGON EMERGENCY) 1 MG KIT Inject 1 mg into the skin See admin instructions. Inject one mg into the skin see administration instructions. Follow package directions for low blood sugar. 06/18/23   [provider]  Insulin  Disposable Pump (OMNIPOD 5 G7 PODS, GEN 5,) MISC Inject 1 Device into the skin every 3 (three) days.    [provider]  levothyroxine  (SYNTHROID ) 150 MCG tablet Take 1 tablet (150 mcg total) by mouth daily before breakfast. 06/01/24   [name], [name], MD  midodrine  (PROAMATINE ) 5 MG tablet Take 5 mg by mouth 3 (three) times daily with meals.    [provider]  NOVOLOG  FLEXPEN 100 UNIT/ML FlexPen Inject 10 Units into the skin 3 (three) times daily with meals. Plus sliding scale TID Patient taking differently: Inject 10 Units into the skin See admin instructions. Inject 10 units into the skin three times a day with meals, PLUS sliding scale- ONLY WHEN NOT USING THE OMNIPOD 12/08/23   [name], MD  omega-3 acid ethyl esters (LOVAZA ) 1 g capsule Take 2 capsules (2 g total) by mouth 2 (two) times daily. 09/13/23   Will Almarie MATSU, MD  ondansetron  (ZOFRAN ) 4 MG tablet Take 2 tablets (8 mg total) by mouth every 8 (eight) hours as needed for nausea or vomiting. 05/31/24   [name], [name], MD  oxyCODONE  (OXY IR/ROXICODONE ) 5 MG immediate release tablet Take 1  tablet (5 mg total) by mouth every 6 (six) hours as needed for moderate pain (pain score 4-6) or severe pain (pain score 7-10). 05/31/24   [name], [name], MD  pantoprazole  (PROTONIX ) 40 MG tablet Take 1 tablet (40 mg total) by mouth 2 (two) times daily. Take 30 minutes before breakfast and dinner Patient taking differently: Take 40 mg by mouth See admin instructions. Take 40 mg by mouth 30 minutes before breakfast and supper 01/16/24   [name], [name], NP  promethazine  (PHENERGAN ) 25 MG suppository Place 25 mg rectally every 6 (six) hours  as needed for nausea or vomiting.    [provider]  promethazine  (PHENERGAN ) 25 MG tablet Take 1 tablet (25 mg total) by mouth every 6 (six) hours as needed for nausea or vomiting (to take instead of rectal suppository). 06/25/24   Dreama Longs, MD  sucralfate  (CARAFATE ) 1 g tablet Take 1 tablet (1 g total) by mouth 2 (two) times daily. Do not take any other medication within 2-4 hours. Patient not taking: Reported on 06/04/2024 12/25/23   Mansouraty, [name], MD  tiZANidine (ZANAFLEX) 4 MG tablet Take 4 mg by mouth every 8 (eight) hours as needed for muscle spasms. 05/27/24 05/27/25  [provider]  TOUJEO  SOLOSTAR 300 UNIT/ML Solostar Pen Inject 25 Units into the skin at bedtime. Patient taking differently: Inject 25 Units into the skin See admin instructions. Inject 25 units into the skin at bedtime ONLY WHEN NOT USING THE OMNIPOD 12/08/23   [name], MD    Allergies: Codeine, Ciprofloxacin , and Flagyl  [metronidazole ]    Review of Systems  Gastrointestinal:  Positive for abdominal pain and nausea.  All other systems reviewed and are negative.   Updated Vital Signs BP (!) 153/95 (BP Location: Right Arm)   Pulse 93   Temp (!) 97.5 F (36.4 C) (Oral)   Resp 19   LMP 08/11/2012   SpO2 96%   Physical Exam Vitals and nursing note reviewed.  Constitutional:      General: She is in acute distress.     Appearance: She is well-developed.     Comments: Patient rolling around in bed moaning  HENT:     Head: Normocephalic and atraumatic.  Eyes:     Conjunctiva/sclera: Conjunctivae normal.  Cardiovascular:     Rate and Rhythm: Normal rate and regular rhythm.     Heart sounds: No murmur heard. Pulmonary:     Effort: Pulmonary effort is normal. No respiratory distress.     Breath sounds: Normal breath sounds.  Abdominal:     Palpations: Abdomen is soft.     Tenderness: There is abdominal tenderness.     Comments: Generalized tenderness to abdomen   Musculoskeletal:        General: No swelling.     Cervical back: Neck supple.  Skin:    General: Skin is warm and dry.     Capillary Refill: Capillary refill takes less than 2 seconds.  Neurological:     Mental Status: She is alert.  Psychiatric:        Mood and Affect: Mood normal.     (all labs ordered are listed, but only abnormal results are displayed) Labs Reviewed  CBC WITH DIFFERENTIAL/PLATELET - Abnormal; Notable for the following components:      Result Value   RBC 5.77 (*)    Hemoglobin 15.9 (*)    HCT 50.9 (*)    RDW 16.4 (*)    All other components within normal  limits  URINALYSIS, ROUTINE W REFLEX MICROSCOPIC  COMPREHENSIVE METABOLIC PANEL WITH GFR  LIPASE, BLOOD    EKG: None  Radiology: No results found.   Procedures   Medications Ordered in the ED  droperidol  (INAPSINE ) 2.5 MG/ML injection 1.25 mg (1.25 mg Intravenous Given 07/18/24 0451)  sodium chloride  0.9 % bolus 1,000 mL (1,000 mLs Intravenous New Bag/Given 07/18/24 0452)    Clinical Course as of 07/18/24 0619  Sat Jul 18, 2024  0557 Patient reassessed after droperidol , resting comfortably in bed. [CG]    Clinical Course User Index [CG] Ruthell Lonni FALCON, PA-C   Medical Decision Making Amount and/or Complexity of Data Reviewed Labs: ordered. Radiology: ordered.  Risk Prescription drug management.   This is a 63 year old female with history of cyclic vomiting syndrome thought to be secondary to cannabis use.  Presents to ED complaining of abdominal pain, nausea.  On arrival, afebrile and nontachycardic.  Lung sounds are clear bilaterally, no hypoxia.  Tenderness to abdomen.  Neuroexam at baseline.  Room smells very heavily of marijuana.  She denies marijuana use this evening.  Suspect CHS.  Will collect labs to include CBC, CMP, lipase and urinalysis.  Patient given 1 L of fluid, EKG was collected which shows no QTc prolongation the patient was then given 1.25 mg droperidol .  After  droperidol  administered, patient resting comfortably in bed.  Patient CBC is without leukocytosis or anemia.  Lipase, CMP, urinalysis pending at this time.  Have added on CT scan of her abdomen.  At this time, patient workup pending.  Signed out to oncoming provider Nivia RIGGERS.  Plan management discussed.    Final diagnoses:  Generalized abdominal pain  Nausea    ED Discharge Orders     None          Ruthell Lonni FALCON RIGGERS 07/18/24 9380    Raford Lenis, MD 07/18/24 [id number]

## 2024-07-18 NOTE — Plan of Care (Signed)

## 2024-07-18 NOTE — ED Notes (Addendum)
 Patient transported to CT

## 2024-07-18 NOTE — ED Triage Notes (Signed)
 Pt arrived via EMS from home w/ c/o of lower abd pain and back x 2days. Endorses nausea. Denies vomiting, diarrhea, and constipation. Pt denies any urinary symptoms.

## 2024-07-18 NOTE — H&P (Signed)
 History and Physical    Patient: [name] [name] FMW:[id number] DOB: [birth year] DOA: 07/18/2024 DOS: the patient was seen and examined on 07/18/2024 PCP: Arlyss Eugene BRAVO, NP  Patient coming from: Home  Chief Complaint:  Chief Complaint  Patient presents with   Abdominal Pain   HPI: [name] [name] is a 63 y.o. female with medical history significant of seasonal allergies, anxiety, B12 deficiency, chest pain, chronic diastolic heart failure, type 1 diabetes, history of DKA, GERD, gastritis and duodenitis, hyperlipidemia, hypertension, hypothyroidism, IBS, intermittent vertigo, stage II CKD, migraine headaches, osteoporosis, peripheral neuropathy, umbilical hernia who presented to the emergency department complaints of lower abdominal and right flank pain associated with nausea, an episode of emesis for the past 2 days.  She has occasional diarrhea, occasional constipation, no  melena or hematochezia.  No hematuria.  He denied fever, chills, rhinorrhea, sore throat, wheezing or hemoptysis.  No chest pain, palpitations, diaphoresis, PND, orthopnea or pitting edema of the lower extremities.  No polyuria, polydipsia, polyphagia or blurred vision.   Lab work: Urinalysis was cloudy with large leukocyte esterase, 11-20 RBC, greater than 50 WBC and rare bacteria.  CBC showed white count 9.3, hemoglobin 15.9 g/dL platelets 626.  Lipase and phosphorus were normal.  Magnesium  2.6 mg/dL.  CMP showed a glucose of 177 mg/dL, the rest of the CMP measurements were normal after calcium  was corrected.  Imaging: CT abdomen/pelvis with contrast showed gastroenteritis without evidence of small bowel obstruction or inflammation.  There is a mildly thickened distal descending and sigmoid colon which could be due to underdistention or colitis.  Possible cystitis with air in the bladder that could be due to recent catheterization or catheterization pain or infectious process.  Mildly prominent intrahepatic and intrahepatic bile  ducts, which is unchanged.  Mildly prominent liver with mild steatosis.  Aortic atherosclerosis.  Small umbilical fat containing hernia.  ED course: Initial vital signs were temperature  97.5 F, pulse 93, respiration 19, BP 153/95 mmHg O2 sat 96% on room air.  The patient received 1000 mL normal saline bolus, ceftriaxone  1 g IVPB, ondansetron  4 mg IVP, morphine  4 mg IVP and droperidol  1.25 mg IVP.  Review of Systems: As mentioned in the history of present illness. All other systems reviewed and are negative. Past Medical History:  Diagnosis Date   Allergy    seasonal   Anxiety    B12 deficiency    Chest pain 04/05/2017   CHF (congestive heart failure) ([name])    Diabetes mellitus type 1, uncontrolled    dr. 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) 10/29/2013   Leg cramping    @ night (09/18/2013)   Migraine    once q couple months (11/26/2018)   Osteoporosis    Peripheral neuropathy    Umbilical hernia    unrepaired (09/18/2013)   Past Surgical History:  Procedure Laterality Date   BIOPSY  06/03/2019   Procedure: BIOPSY;  Surgeon: Charlanne Groom, MD;  Location: [facility];  Service: Endoscopy;;   BIOPSY  08/17/2020   Procedure: BIOPSY;  Surgeon: Eda Iha, MD;  Location: [facility];  Service: Gastroenterology;;   BIOPSY  02/05/2023   Procedure: BIOPSY;  Surgeon: Aneita Gwendlyn DASEN, MD;  Location: [facility];  Service: Gastroenterology;;   BIOPSY  09/24/2023   Procedure: BIOPSY;  Surgeon: Wilhelmenia Aloha Raddle., MD;  Location: [facility];  Service: Gastroenterology;;   CESAREAN SECTION  1989   ENTEROSCOPY N/A 06/03/2019   Procedure: ENTEROSCOPY;  Surgeon: Charlanne Groom, MD;  Location: [facility];   Service: Endoscopy;  Laterality: N/A;   ESOPHAGOGASTRODUODENOSCOPY (EGD) WITH PROPOFOL  N/A 08/17/2020   Procedure: ESOPHAGOGASTRODUODENOSCOPY (EGD) WITH PROPOFOL ;  Surgeon: Eda Iha, MD;  Location: [facility];  Service: Gastroenterology;  Laterality: N/A;   ESOPHAGOGASTRODUODENOSCOPY (EGD) WITH PROPOFOL  N/A 02/05/2023   Procedure: ESOPHAGOGASTRODUODENOSCOPY (EGD) WITH PROPOFOL ;  Surgeon: Aneita Gwendlyn DASEN, MD;  Location: [facility];  Service: Gastroenterology;  Laterality: N/A;   ESOPHAGOGASTRODUODENOSCOPY (EGD) WITH PROPOFOL  N/A 09/24/2023   Procedure: ESOPHAGOGASTRODUODENOSCOPY (EGD) WITH PROPOFOL ;  Surgeon: Wilhelmenia Aloha Raddle., MD;  Location: [facility];  Service: Gastroenterology;  Laterality: N/A;   EUS N/A 09/24/2023   Procedure: UPPER ENDOSCOPIC ULTRASOUND (EUS) LINEAR;  Surgeon: Wilhelmenia Aloha Raddle., MD;  Location: [facility];  Service: Gastroenterology;  Laterality: N/A;   LAPAROSCOPIC ASSISTED VAGINAL HYSTERECTOMY  09/23/2012   Procedure: LAPAROSCOPIC ASSISTED VAGINAL HYSTERECTOMY;  Surgeon: Duwaine Blumenthal, DO;  Location: [facility];  Service: Gynecology;  Laterality: N/A;  pull Dr Jacqulyn instrument   LEFT HEART CATH AND CORONARY ANGIOGRAPHY N/A 02/11/2017   Procedure: Left Heart Cath and Coronary Angiography;  Surgeon: Dorn JINNY Lesches, MD;  Location: [facility];  Service: Cardiovascular;  Laterality: N/A;   TUBAL LIGATION Bilateral 1992   Social History:  reports that she quit smoking about 33 years ago. Her smoking use included cigarettes. She started smoking about 43 years ago. She has a 1.2 pack-year smoking history. She has never used smokeless tobacco. She reports that she does not currently use drugs after having used the following drugs: Marijuana. She reports that she does not drink alcohol.  Allergies  Allergen Reactions   Codeine Hives and Anxiety   Ciprofloxacin  Diarrhea, Nausea Only and Other (See Comments)    Caused extreme nausea   Flagyl   [Metronidazole ] Nausea Only and Other (See Comments)    Can tolerate ONLY VIA IV (otherwise, SEVERE NAUSEA)    Family History  Problem Relation Age of Onset   Heart failure Mother 39       Her heart skipped a beat and stopped - per pt   Heart failure Brother    Diabetes Brother    Diabetes Maternal Grandmother    Alzheimer's disease Maternal Grandmother    Coronary artery disease Maternal Grandmother    Heart attack Maternal Grandfather    Diabetes Other    Heart disease Other    Colon polyps Neg Hx    Esophageal cancer Neg Hx    Liver cancer Neg Hx    Pancreatic cancer Neg Hx    Stomach cancer Neg Hx    Crohn's disease Neg Hx    Rectal cancer Neg Hx    Colon cancer Neg Hx     Prior to Admission medications   Medication Sig Start Date End Date Taking? Authorizing Provider  acetaminophen  (TYLENOL ) 650 MG suppository Place 1 suppository (650 mg total) rectally every 6 (six) hours as needed for mild pain (pain score 1-3) or fever (or Fever >/= 101). 06/06/24   Will Almarie MATSU, MD  aspirin  EC 81 MG tablet Take 81 mg by mouth daily. Swallow whole.    [provider]  azelastine  (ASTELIN ) 0.1 % nasal spray Place 1 spray into both nostrils 2 (two) times daily  as needed for rhinitis or allergies. 11/04/23   [provider]  BAQSIMI ONE PACK 3 MG/DOSE POWD Place 3 mg into the nose as needed (severe low blood sugar). 01/30/24   [provider]  Continuous Glucose Sensor (DEXCOM G7 SENSOR) MISC Inject 1 each into the skin See admin instructions. Place 1 new sensor into the skin every 10 days 01/30/24   [provider]  gabapentin  (NEURONTIN ) 100 MG capsule Take 100 mg by mouth at bedtime.    [provider]  Glucagon, rDNA, (GLUCAGON EMERGENCY) 1 MG KIT Inject 1 mg into the skin See admin instructions. Inject one mg into the skin see administration instructions. Follow package directions for low blood sugar. 06/18/23   [provider]   Insulin  Disposable Pump (OMNIPOD 5 G7 PODS, GEN 5,) MISC Inject 1 Device into the skin every 3 (three) days.    [provider]  levothyroxine  (SYNTHROID ) 150 MCG tablet Take 1 tablet (150 mcg total) by mouth daily before breakfast. 06/01/24   [name], [name], MD  midodrine  (PROAMATINE ) 5 MG tablet Take 5 mg by mouth 3 (three) times daily with meals.    [provider]  NOVOLOG  FLEXPEN 100 UNIT/ML FlexPen Inject 10 Units into the skin 3 (three) times daily with meals. Plus sliding scale TID Patient taking differently: Inject 10 Units into the skin See admin instructions. Inject 10 units into the skin three times a day with meals, PLUS sliding scale- ONLY WHEN NOT USING THE OMNIPOD 12/08/23   [name], MD  omega-3 acid ethyl esters (LOVAZA ) 1 g capsule Take 2 capsules (2 g total) by mouth 2 (two) times daily. 09/13/23   Will Almarie MATSU, MD  ondansetron  (ZOFRAN ) 4 MG tablet Take 2 tablets (8 mg total) by mouth every 8 (eight) hours as needed for nausea or vomiting. 05/31/24   [name], [name], MD  oxyCODONE  (OXY IR/ROXICODONE ) 5 MG immediate release tablet Take 1 tablet (5 mg total) by mouth every 6 (six) hours as needed for moderate pain (pain score 4-6) or severe pain (pain score 7-10). 05/31/24   [name], [name], MD  pantoprazole  (PROTONIX ) 40 MG tablet Take 1 tablet (40 mg total) by mouth 2 (two) times daily. Take 30 minutes before breakfast and dinner Patient taking differently: Take 40 mg by mouth See admin instructions. Take 40 mg by mouth 30 minutes before breakfast and supper 01/16/24   [name], [name], NP  promethazine  (PHENERGAN ) 25 MG suppository Place 25 mg rectally every 6 (six) hours as needed for nausea or vomiting.    [provider]  promethazine  (PHENERGAN ) 25 MG tablet Take 1 tablet (25 mg total) by mouth every 6 (six) hours as needed for nausea or vomiting (to take instead of rectal suppository). 06/25/24   Dreama Longs, MD  sucralfate   (CARAFATE ) 1 g tablet Take 1 tablet (1 g total) by mouth 2 (two) times daily. Do not take any other medication within 2-4 hours. Patient not taking: Reported on 06/04/2024 12/25/23   Mansouraty, [name], MD  tiZANidine (ZANAFLEX) 4 MG tablet Take 4 mg by mouth every 8 (eight) hours as needed for muscle spasms. 05/27/24 05/27/25  [provider]  TOUJEO  SOLOSTAR 300 UNIT/ML Solostar Pen Inject 25 Units into the skin at bedtime. Patient taking differently: Inject 25 Units into the skin See admin instructions. Inject 25 units into the skin at bedtime ONLY WHEN NOT USING THE OMNIPOD 12/08/23   [name], MD  Physical Exam: Vitals:   07/18/24 0800 07/18/24 0830 07/18/24 0847 07/18/24 0900  BP: (!) 144/95 (!) 81/65 100/76 102/84  Pulse: (!) 102 83 87 89  Resp:  14 14 14   Temp:      TempSrc:      SpO2: 95% 96% 93% 96%   Physical Exam Vitals and nursing note reviewed.  Constitutional:      General: She is awake. She is not in acute distress.    Appearance: She is ill-appearing.  HENT:     Head: Normocephalic.     Nose: No rhinorrhea.     Mouth/Throat:     Mouth: Mucous membranes are dry.  Eyes:     General: No scleral icterus.    Pupils: Pupils are equal, round, and reactive to light.  Neck:     Vascular: No JVD.  Cardiovascular:     Rate and Rhythm: Normal rate and regular rhythm.     Heart sounds: S1 normal and S2 normal.  Pulmonary:     Effort: Pulmonary effort is normal.     Breath sounds: Normal breath sounds. No wheezing, rhonchi or rales.  Abdominal:     General: Bowel sounds are increased. There is no distension.     Palpations: Abdomen is soft.     Tenderness: There is abdominal tenderness. There is right CVA tenderness. There is no left CVA tenderness.  Musculoskeletal:     Cervical back: Neck supple.     Right lower leg: No edema.     Left lower leg: No edema.  Skin:    General: Skin is warm and dry.  Neurological:     General: No focal  deficit present.     Mental Status: She is alert and oriented to person, place, and time.  Psychiatric:        Mood and Affect: Mood normal.        Behavior: Behavior normal. Behavior is cooperative.     Data Reviewed:  Results are pending, will review when available.  EKG: Vent. rate 89 BPM PR interval 167 ms QRS duration 89 ms QT/QTcB 356/434 ms P-R-T axes 68 91 54 Sinus rhythm Right axis deviation  Assessment and Plan: Principal Problem:   Abdominal pain Associated with:   Intractable nausea   GERD   Gastritis and gastroduodenitis   Gastroparesis Observation/MedSurg. Continue IV fluids. Keep n.p.o. for now. Advance diet as tolerated. Analgesics as needed. Antiemetics as needed. Continue home PPI. Follow CBC, CMP in AM. Cannabis use cessation advised.  Active Problems:   UTI (urinary tract infection) Continue ceftriaxone  1 g IVPB. Analgesics as needed. Follow-up urine culture and sensitivity.    Uncontrolled type 1 diabetes mellitus  with hyperglycemia, with long-term current use of insulin  ([name]) Carbohydrate modified diet. CBG monitoring every 4 hours. Continue use of insulin  pump. Last hemoglobin A1c was 8.4% on 05/29/2024.    non obstructive CAD (coronary artery disease)   Hyperlipidemia Continue aspirin  81 mg p.o. daily. Might benefit from statin therapy.    Hypothyroidism Continue levothyroxine  150 mcg p.o. daily.    Chronic diastolic HF (heart failure) ([name]) No signs of decompensation.    Paroxysmal A-fib ([name]) Currently in sinus rhythm.    Marijuana abuse Cessation advised.    Polycythemia Monitor hematocrit and hemoglobin.      Advance Care Planning:   Code Status: Full Code   Consults:   Family Communication:   Severity of Illness: The appropriate patient status for this patient is OBSERVATION. Observation status is judged  to be reasonable and necessary in order to provide the required intensity of service to ensure the  patient's safety. The patient's presenting symptoms, physical exam findings, and initial radiographic and laboratory data in the context of their medical condition is felt to place them at decreased risk for further clinical deterioration. Furthermore, it is anticipated that the patient will be medically stable for discharge from the hospital within 2 midnights of admission.   Author: Alm Dorn Castor, MD 07/18/2024 11:10 AM  For on call review [email or web address].   This document was prepared using Dragon voice recognition software and may contain some unintended transcription errors.

## 2024-07-18 NOTE — ED Provider Notes (Signed)
 Received signout from previous provider, please see his note for complete H&P.  63 year old female with known history of cyclic vomiting syndrome, regular cannabis abuse, and history of diabetes with DKA presenting with complaint of abdominal pain.  Her symptom has been an ongoing issue for the past 2 days.  Currently symptoms suggestive of CHS, a CT scan of the abdomen pelvis have been ordered.  Other labs were independently reviewed and interpreted by me and are overall reassuring  Urinalysis showed signs of urinary tract infection therefore Rocephin  was given.  Patient still endorsed nausea, Zofran  given for nausea and a CT scan of abdomen pelvis which shows signs of gastroenteritis as well as possible cystitis.  Patient does endorse having nausea vomiting diarrhea consistent with gastroenteritis she also complaining of burning in urination consistent with UTI.  At this time patient states her nausea still persist and does not feel comfortable going home.  I appreciate consultation from [facility]  hospitalist Dr. Celinda who agrees to admit patient for further management of her condition.  BP 102/84 (BP Location: Right Arm)   Pulse 89   Temp 98 F (36.7 C) (Oral)   Resp 14   LMP 08/11/2012   SpO2 96%   Results for orders placed or performed during the hospital encounter of 07/18/24  CBC with Differential   Collection Time: 07/18/24  4:29 AM  Result Value Ref Range   WBC 9.3 4.0 - 10.5 K/uL   RBC 5.77 (H) 3.87 - 5.11 MIL/uL   Hemoglobin 15.9 (H) 12.0 - 15.0 g/dL   HCT 49.0 (H) 63.9 - 53.9 %   MCV 88.2 80.0 - 100.0 fL   MCH 27.6 26.0 - 34.0 pg   MCHC 31.2 30.0 - 36.0 g/dL   RDW 83.5 (H) 88.4 - 84.4 %   Platelets 373 150 - 400 K/uL   nRBC 0.0 0.0 - 0.2 %   Neutrophils Relative % 72 %   Neutro Abs 6.6 1.7 - 7.7 K/uL   Lymphocytes Relative 22 %   Lymphs Abs 2.1 0.7 - 4.0 K/uL   Monocytes Relative 5 %   Monocytes Absolute 0.5 0.1 - 1.0 K/uL   Eosinophils Relative 1 %   Eosinophils Absolute  0.1 0.0 - 0.5 K/uL   Basophils Relative 0 %   Basophils Absolute 0.0 0.0 - 0.1 K/uL   Immature Granulocytes 0 %   Abs Immature Granulocytes 0.03 0.00 - 0.07 K/uL  Comprehensive metabolic panel with GFR   Collection Time: 07/18/24  5:33 AM  Result Value Ref Range   Sodium 139 135 - 145 mmol/L   Potassium 3.8 3.5 - 5.1 mmol/L   Chloride 101 98 - 111 mmol/L   CO2 26 22 - 32 mmol/L   Glucose, Bld 177 (H) 70 - 99 mg/dL   BUN 20 8 - 23 mg/dL   Creatinine, Ser 9.34 0.44 - 1.00 mg/dL   Calcium  8.7 (L) 8.9 - 10.3 mg/dL   Total Protein 7.5 6.5 - 8.1 g/dL   Albumin 3.8 3.5 - 5.0 g/dL   AST 17 15 - 41 U/L   ALT 15 0 - 44 U/L   Alkaline Phosphatase 72 38 - 126 U/L   Total Bilirubin 1.0 0.0 - 1.2 mg/dL   GFR, Estimated >39 >39 mL/min   Anion gap 12 5 - 15  Lipase, blood   Collection Time: 07/18/24  5:33 AM  Result Value Ref Range   Lipase 26 11 - 51 U/L  Urinalysis, Routine w reflex microscopic -Urine, Clean  Catch   Collection Time: 07/18/24  6:30 AM  Result Value Ref Range   Color, Urine YELLOW YELLOW   APPearance CLOUDY (A) CLEAR   Specific Gravity, Urine 1.017 1.005 - 1.030   pH 7.0 5.0 - 8.0   Glucose, UA NEGATIVE NEGATIVE mg/dL   Hgb urine dipstick NEGATIVE NEGATIVE   Bilirubin Urine NEGATIVE NEGATIVE   Ketones, ur NEGATIVE NEGATIVE mg/dL   Protein, ur NEGATIVE NEGATIVE mg/dL   Nitrite NEGATIVE NEGATIVE   Leukocytes,Ua LARGE (A) NEGATIVE   RBC / HPF 11-20 0 - 5 RBC/hpf   WBC, UA >50 0 - 5 WBC/hpf   Bacteria, UA RARE (A) NONE SEEN   Squamous Epithelial / HPF 11-20 0 - 5 /HPF   Mucus PRESENT    CT ABDOMEN PELVIS W CONTRAST Result Date: 07/18/2024 CLINICAL DATA:  Abdominal pain, acute, nonlocalized. EXAM: CT ABDOMEN AND PELVIS WITH CONTRAST TECHNIQUE: Multidetector CT imaging of the abdomen and pelvis was performed using the standard protocol following bolus administration of intravenous contrast. RADIATION DOSE REDUCTION: This exam was performed according to the departmental  dose-optimization program which includes automated exposure control, adjustment of the mA and/or kV according to patient size and/or use of iterative reconstruction technique. CONTRAST:  OMNIPAQUE  IOHEXOL  300 MG/ML  SOLN COMPARISON:  Large number of prior CTs back to 2009. The 3 most recent are all with contrast and dated 05/28/2024, 06/04/2024, and 06/25/2024. FINDINGS: Lower chest: Cardiac size is normal. The lung bases are clear. There is again noted thickening in the distal thoracic esophagus. Etiology typically reflux. Hepatobiliary: The liver is 20 cm length mildly steatotic, including with focal periligamentous fat in segment 4B. there is no mass enhancement. The gallbladder is absent. There is chronic prominence in the common bile duct which measures 10 mm, stable. There is also stable mild intrahepatic biliary prominence. Pancreas: No abnormality. Spleen: No abnormality. Adrenals/Urinary Tract: Adrenal glands are unremarkable. Kidneys are normal, without renal calculi, focal lesion, or hydronephrosis. Bladder is mildly thickened in general without masslike thickening. Correlate clinically for cystitis. There is air in the anterior bladder which could be due to recent catheterization, catheterization attempt, or infectious process. There is a small diverticulum off the left posterolateral wall. Stomach/Bowel: There is moderate fluid distension of the stomach. Mild gastric thickened folds and mucosal enhancement consistent with gastritis. Normal caliber small bowel, with portions of the mid and lower abdominal small bowel also showing mucosal enhancement. There is no small bowel dilatation or mesenteric inflammatory change. There is a normal caliber appendix. The colon wall is unremarkable until the distal descending and sigmoid segments which are mildly thickened versus exaggerated from underdistension. Correlate clinically for underlying colitis. There is diffuse colonic diverticulosis.  Vascular/Lymphatic: Aortic atherosclerosis. No enlarged abdominal or pelvic lymph nodes. Reproductive: Status post hysterectomy. No adnexal masses. Other: Small umbilical fat hernia. No incarcerated hernia. No free fluid, free hemorrhage free air. Musculoskeletal: Mild degenerative of spine. No acute or significant osseous findings. IMPRESSION: 1. Gastroenteritis without evidence of small-bowel obstruction or inflammation. 2. Mildly thickened distal descending and sigmoid colon which could be due to underdistention or colitis. 3. Possible cystitis. Also there is air in the bladder which could be due to recent catheterization, catheterization attempt, or infectious process. 4. Mildly prominent intrahepatic and extrahepatic bile ducts, stable. 5. Mildly prominent liver with mild steatosis. 6. Aortic atherosclerosis. 7. Small umbilical fat hernia. Aortic Atherosclerosis (ICD10-I70.0). Electronically Signed   By: Francis Quam M.D.   On: 07/18/2024 07:16   CT ABDOMEN  PELVIS W CONTRAST Result Date: 06/25/2024 CLINICAL DATA:  Acute abdominal pain. Nausea and vomiting with tenderness and guarding for 2 weeks. EXAM: CT ABDOMEN AND PELVIS WITH CONTRAST TECHNIQUE: Multidetector CT imaging of the abdomen and pelvis was performed using the standard protocol following bolus administration of intravenous contrast. RADIATION DOSE REDUCTION: This exam was performed according to the departmental dose-optimization program which includes automated exposure control, adjustment of the mA and/or kV according to patient size and/or use of iterative reconstruction technique. CONTRAST:  OMNIPAQUE  IOHEXOL  300 MG/ML  SOLN COMPARISON:  06/04/2024 FINDINGS: Lower chest: No acute abnormality. Hepatobiliary: No focal liver abnormality. Cholecystectomy. Unchanged dilatation of the common bile duct which measures 9 mm. Mild intrahepatic biliary dilatation is also similar. No calcified stones noted along the course of the CBD. Pancreas:  Unremarkable. No pancreatic ductal dilatation or surrounding inflammatory changes. Spleen: Normal in size without focal abnormality. Adrenals/Urinary Tract: Normal adrenal glands. No nephrolithiasis, hydronephrosis or mass. Moderate distension of the urinary bladder without focal abnormality. Stomach/Bowel: Moderate distension of the stomach. No pathologic dilatation of the bowel loops. The appendix is visualized and appears normal. Mild diffuse wall thickening of the colon with mucosal enhancement is identified. Scattered colonic diverticula noted without signs of acute diverticulitis. Vascular/Lymphatic: Aortic atherosclerosis. No enlarged abdominal or pelvic lymph nodes. Reproductive: Status post hysterectomy. No adnexal masses. Other: No significant free fluid or fluid collections. Small fat containing umbilical hernia. Musculoskeletal: No acute or significant osseous findings. IMPRESSION: 1. Mild diffuse wall thickening of the colon with mucosal enhancement is identified. Imaging findings are concerning for colitis. Clinical correlation advised. 2. Colonic diverticulosis without signs of acute diverticulitis. 3. Unchanged dilatation of the common bile duct and intrahepatic biliary tree. In the absence of clinical signs or symptoms of biliary obstruction these findings are likely related to prior cholecystectomy. 4.  Aortic Atherosclerosis (ICD10-I70.0). Electronically Signed   By: Waddell Calk M.D.   On: 06/25/2024 06:05      Nivia Colon, PA-C 07/18/24 1002    Freddi Hamilton, MD 07/18/24 1136

## 2024-07-19 DIAGNOSIS — E785 Hyperlipidemia, unspecified: Secondary | ICD-10-CM | POA: Diagnosis present

## 2024-07-19 DIAGNOSIS — K3184 Gastroparesis: Secondary | ICD-10-CM | POA: Diagnosis present

## 2024-07-19 DIAGNOSIS — E1043 Type 1 diabetes mellitus with diabetic autonomic (poly)neuropathy: Secondary | ICD-10-CM | POA: Diagnosis present

## 2024-07-19 DIAGNOSIS — K529 Noninfective gastroenteritis and colitis, unspecified: Secondary | ICD-10-CM | POA: Diagnosis present

## 2024-07-19 DIAGNOSIS — K2990 Gastroduodenitis, unspecified, without bleeding: Secondary | ICD-10-CM | POA: Diagnosis present

## 2024-07-19 DIAGNOSIS — J302 Other seasonal allergic rhinitis: Secondary | ICD-10-CM | POA: Diagnosis present

## 2024-07-19 DIAGNOSIS — E1065 Type 1 diabetes mellitus with hyperglycemia: Secondary | ICD-10-CM | POA: Diagnosis present

## 2024-07-19 DIAGNOSIS — R109 Unspecified abdominal pain: Secondary | ICD-10-CM | POA: Diagnosis not present

## 2024-07-19 DIAGNOSIS — E039 Hypothyroidism, unspecified: Secondary | ICD-10-CM | POA: Diagnosis present

## 2024-07-19 DIAGNOSIS — E1042 Type 1 diabetes mellitus with diabetic polyneuropathy: Secondary | ICD-10-CM | POA: Diagnosis present

## 2024-07-19 DIAGNOSIS — K219 Gastro-esophageal reflux disease without esophagitis: Secondary | ICD-10-CM | POA: Diagnosis present

## 2024-07-19 DIAGNOSIS — D751 Secondary polycythemia: Secondary | ICD-10-CM | POA: Diagnosis present

## 2024-07-19 DIAGNOSIS — M810 Age-related osteoporosis without current pathological fracture: Secondary | ICD-10-CM | POA: Diagnosis present

## 2024-07-19 DIAGNOSIS — F1210 Cannabis abuse, uncomplicated: Secondary | ICD-10-CM | POA: Diagnosis present

## 2024-07-19 DIAGNOSIS — I480 Paroxysmal atrial fibrillation: Secondary | ICD-10-CM | POA: Diagnosis present

## 2024-07-19 DIAGNOSIS — K2970 Gastritis, unspecified, without bleeding: Secondary | ICD-10-CM | POA: Diagnosis present

## 2024-07-19 DIAGNOSIS — B9620 Unspecified Escherichia coli [E. coli] as the cause of diseases classified elsewhere: Secondary | ICD-10-CM | POA: Diagnosis present

## 2024-07-19 DIAGNOSIS — Z794 Long term (current) use of insulin: Secondary | ICD-10-CM | POA: Diagnosis not present

## 2024-07-19 DIAGNOSIS — I130 Hypertensive heart and chronic kidney disease with heart failure and stage 1 through stage 4 chronic kidney disease, or unspecified chronic kidney disease: Secondary | ICD-10-CM | POA: Diagnosis present

## 2024-07-19 DIAGNOSIS — N3090 Cystitis, unspecified without hematuria: Secondary | ICD-10-CM | POA: Diagnosis present

## 2024-07-19 DIAGNOSIS — I2510 Atherosclerotic heart disease of native coronary artery without angina pectoris: Secondary | ICD-10-CM | POA: Diagnosis present

## 2024-07-19 DIAGNOSIS — Z9641 Presence of insulin pump (external) (internal): Secondary | ICD-10-CM | POA: Diagnosis present

## 2024-07-19 DIAGNOSIS — E1022 Type 1 diabetes mellitus with diabetic chronic kidney disease: Secondary | ICD-10-CM | POA: Diagnosis present

## 2024-07-19 DIAGNOSIS — I5032 Chronic diastolic (congestive) heart failure: Secondary | ICD-10-CM | POA: Diagnosis present

## 2024-07-19 DIAGNOSIS — K5900 Constipation, unspecified: Secondary | ICD-10-CM | POA: Diagnosis present

## 2024-07-19 DIAGNOSIS — N390 Urinary tract infection, site not specified: Secondary | ICD-10-CM | POA: Diagnosis present

## 2024-07-19 LAB — BASIC METABOLIC PANEL WITH GFR
Anion gap: 11 (ref 5–15)
BUN: 13 mg/dL (ref 8–23)
CO2: 21 mmol/L — ABNORMAL LOW (ref 22–32)
Calcium: 8.2 mg/dL — ABNORMAL LOW (ref 8.9–10.3)
Chloride: 104 mmol/L (ref 98–111)
Creatinine, Ser: 0.84 mg/dL (ref 0.44–1.00)
GFR, Estimated: >60 mL/min (ref >60)
Glucose, Bld: 92 mg/dL (ref 70–99)
Potassium: 3.8 mmol/L (ref 3.5–5.1)
Sodium: 136 mmol/L (ref 135–145)

## 2024-07-19 LAB — CBC
HCT: 39.9 % (ref 36.0–46.0)
Hemoglobin: 12.8 g/dL (ref 12.0–15.0)
MCH: 28.1 pg (ref 26.0–34.0)
MCHC: 32.1 g/dL (ref 30.0–36.0)
MCV: 87.5 fL (ref 80.0–100.0)
Platelets: 294 K/uL (ref 150–400)
RBC: 4.56 MIL/uL (ref 3.87–5.11)
RDW: 15.9 % — ABNORMAL HIGH (ref 11.5–15.5)
WBC: 7.5 K/uL (ref 4.0–10.5)
nRBC: 0 % (ref 0.0–0.2)

## 2024-07-19 LAB — GLUCOSE, CAPILLARY
Glucose-Capillary: 100 mg/dL — ABNORMAL HIGH (ref 70–99)
Glucose-Capillary: 105 mg/dL — ABNORMAL HIGH (ref 70–99)
Glucose-Capillary: 144 mg/dL — ABNORMAL HIGH (ref 70–99)
Glucose-Capillary: 110 mg/dL — ABNORMAL HIGH (ref 70–99)
Glucose-Capillary: 142 mg/dL — ABNORMAL HIGH (ref 70–99)

## 2024-07-19 MED ORDER — SODIUM CHLORIDE 0.45 % IV SOLN: INTRAVENOUS | Status: AC

## 2024-07-19 MED ORDER — INSULIN ASPART 100 UNIT/ML IJ SOLN
0.0000 [IU] | Freq: Every day | INTRAMUSCULAR | Status: DC
  Administered 2024-07-19: 1 [IU] via SUBCUTANEOUS

## 2024-07-19 MED ORDER — INSULIN GLARGINE-YFGN 100 UNIT/ML ~~LOC~~ SOLN: 15.0000 [IU] | Freq: Every day | SUBCUTANEOUS | Status: DC

## 2024-07-19 MED ORDER — INSULIN GLARGINE-YFGN 100 UNIT/ML ~~LOC~~ SOLN
10.0000 [IU] | Freq: Every day | SUBCUTANEOUS | Status: DC
  Administered 2024-07-19 – 2024-07-20 (×3): 10 [IU] via SUBCUTANEOUS
  Filled 2024-07-19 (×3): qty 0.1

## 2024-07-19 MED ORDER — INSULIN ASPART 100 UNIT/ML IJ SOLN
0.0000 [IU] | Freq: Three times a day (TID) | INTRAMUSCULAR | Status: DC
  Administered 2024-07-20 (×2): 2 [IU] via SUBCUTANEOUS
  Administered 2024-07-21 (×2): 1 [IU] via SUBCUTANEOUS

## 2024-07-19 NOTE — Plan of Care (Signed)
  Problem: Clinical Measurements: Goal: Ability to maintain clinical measurements within normal limits will improve Outcome: Progressing Goal: Will remain free from infection Outcome: Progressing   Problem: Pain Managment: Goal: General experience of comfort will improve and/or be controlled Outcome: Progressing   Problem: Safety: Goal: Ability to remain free from injury will improve Outcome: Progressing

## 2024-07-19 NOTE — Progress Notes (Signed)
 Patient sleeping soundly at this time, no acute distress noted.

## 2024-07-19 NOTE — Progress Notes (Signed)
 PROGRESS NOTE  [name] [name]  DOB: May 20, 1961  PCP: Arlyss Eugene BRAVO, NP FMW:[id number]  DOA: 07/18/2024  LOS: 0 days  Hospital Day: 2  Brief narrative: [name] [name] is a 63 y.o. female with PMH significant for DM1 on insulin  pump, HTN, HLD, CHF, vitamin B12 deficiency, peripheral neuropathy, hypothyroidism, GERD, anxiety, IBS, cannabis use GERD last time, patient presented to the ED with complaint of lower abdominal pain, right flank pain with nausea, vomiting  In the ED, patient was afebrile, hemodynamically stable Labs showed WBC count 9.3, lipase normal Urinalysis showed cloudy urine with large leukocyte esterase, rare bacteria CT abdomen/pelvis with contrast showed  -gastroenteritis without evidence of small bowel obstruction or inflammation.  There is a mildly thickened distal descending and sigmoid colon which could be due to underdistention or colitis.  -Possible cystitis with air in the bladder that could be due to recent catheterization or catheterization pain or infectious process.    Patient was started on IV Rocephin , IV antiemetics, IV analgesic, IV fluids. Admitted to [facility]  Subjective: Patient was seen and examined this morning. Sitting up in bed.  Complains of persistent nausea and abdominal pain.  Tolerating clear liquid diet and wants to advance to full liquid today. Had few episodes of diarrhea yesterday, not since then Chart reviewed In the last 24 hours, patient remains hemodynamically stable. Most recent labs from this morning with CBC normal, BMP normal  Assessment and plan: Intractable nausea, vomiting Presented with abdominal pain, nausea, vomiting CT abdomen with gastroenteritis without SBO.  Mildly thickened wall of distal descending and sigmoid colon could be underdistention or colitis.  Given absence of fever and leukocytosis, I suspect underdistention as the cause.   Currently on clear liquid diet.  Continues to have pain and nausea but wants to  advance to full liquid diet today Continue PRN analgesics, antiemetic Continue IV fluid. Counseled to quit cannabis Electrolytes normal Continue Protonix  twice daily Recent Labs  Lab 07/18/24 0429 07/19/24 0411  WBC 9.3 7.5   UTI Urinalysis showed cloudy urine CT abdomen showed possible cystitis with air in the bladder Currently on IV Rocephin   Type 1 diabetes mellitus A1c 8.4 on 05/29/2024 PTA meds-insulin  pump Currently continued on insulin  pump Blood sugar level stable Recent Labs  Lab 07/18/24 2002 07/18/24 2348 07/19/24 0347 07/19/24 0718 07/19/24 1152  GLUCAP 98 111* 100* 105* 144*   CAD HLD Continue aspirin  ?? Not on statin  Chronic diastolic CHF HTN No signs of decompensation.   Paroxysmal A-fib Currently in sinus rhythm.  Hypothyroidism Continue Synthroid    Marijuana abuse Cessation advised.   Polycythemia Monitor hematocrit and hemoglobin.   Mobility: Encourage ambulation  Goals of care   Code Status: Full Code     DVT prophylaxis:  enoxaparin  (LOVENOX ) injection 40 mg Start: 07/18/24 2200   Antimicrobials: IV Rocephin  Fluid: Can resume half NS at 75 mL/h Consultants: None Family Communication: None at bedside  Status: Observation Level of care:  Med-Surg   Patient is from: Home Needs to continue in-hospital care: Continues to have abdominal pain, nausea Anticipated d/c to: Hopefully home in 1 to 2 days    Diet:  Diet Order             Diet full liquid Fluid consistency: Thin  Diet effective now                   Scheduled Meds:  enoxaparin  (LOVENOX ) injection  40 mg Subcutaneous Q24H   insulin  pump  Subcutaneous Q4H   metoCLOPramide  (REGLAN ) injection  5 mg Intravenous Q6H    PRN meds: acetaminophen  **OR** acetaminophen , HYDROmorphone  (DILAUDID ) injection, ondansetron  **OR** ondansetron  (ZOFRAN ) IV, prochlorperazine    Infusions:   sodium chloride      cefTRIAXone  (ROCEPHIN )  IV 1 g (07/19/24 0821)     Antimicrobials: Anti-infectives (From admission, onward)    Start     Dose/Rate Route Frequency Ordered Stop   07/19/24 0800  cefTRIAXone  (ROCEPHIN ) 1 g in sodium chloride  0.9 % 100 mL IVPB        1 g 200 mL/hr over 30 Minutes Intravenous Every 24 hours 07/18/24 1055     07/18/24 0815  cefTRIAXone  (ROCEPHIN ) 1 g in sodium chloride  0.9 % 100 mL IVPB        1 g 200 mL/hr over 30 Minutes Intravenous  Once 07/18/24 0804 07/18/24 0843       Objective: Vitals:   07/19/24 0958 07/19/24 1215  BP: (!) 163/97 (!) 148/90  Pulse: 83 89  Resp: 18 12  Temp: 98.1 F (36.7 C) 98.4 F (36.9 C)  SpO2: 100% 100%    Intake/Output Summary (Last 24 hours) at 07/19/2024 1427 Last data filed at 07/19/2024 9178 Gross per 24 hour  Intake 3923.21 ml  Output --  Net 3923.21 ml   Filed Weights   07/18/24 1919  Weight: 54 kg   Weight change:  Body mass index is 24.04 kg/m.   Physical Exam: General exam: Pleasant, Caucasian female Skin: No rashes, lesions or ulcers. HEENT: Atraumatic, normocephalic, no obvious bleeding Lungs: Clear to auscultation bilaterally,  CVS: S1, S2, no murmur,   GI/Abd: Soft, mild diffuse abdominal tenderness more in epigastrium, nondistended, bowel sound present,   CNS: Alert, awake, oriented x 3 Psychiatry: Mood appropriate Extremities: No pedal edema, no calf tenderness,   Data Review: I have personally reviewed the laboratory data and studies available.  F/u labs ordered Unresulted Labs (From admission, onward)     Start     Ordered   07/20/24 0500  Basic metabolic panel with GFR  Tomorrow morning,   R        07/19/24 1427   07/20/24 0500  CBC with Differential/Platelet  Tomorrow morning,   R        07/19/24 1427            Signed, Chapman Rota, MD [facility] 07/19/2024

## 2024-07-19 NOTE — Progress Notes (Signed)
   07/19/24 1115  TOC Brief Assessment  Insurance and Status Reviewed  Patient has primary care physician Yes  Home environment has been reviewed single family home  Prior level of function: independent  Prior/Current Home Services No current home services  Social Drivers of Health Review SDOH reviewed no interventions necessary  Readmission risk has been reviewed Yes  Transition of care needs no transition of care needs at this time    Signed: Heather Saltness, MSW, LCSW Clinical Social Worker Inpatient Care Management 07/19/2024 11:16 AM

## 2024-07-20 DIAGNOSIS — R109 Unspecified abdominal pain: Secondary | ICD-10-CM | POA: Diagnosis not present

## 2024-07-20 LAB — CBC WITH DIFFERENTIAL/PLATELET
Abs Immature Granulocytes: 0.03 K/uL (ref 0.00–0.07)
Basophils Absolute: 0.1 K/uL (ref 0.0–0.1)
Basophils Relative: 1 %
Eosinophils Absolute: 0.1 K/uL (ref 0.0–0.5)
Eosinophils Relative: 2 %
HCT: 42.9 % (ref 36.0–46.0)
Hemoglobin: 14 g/dL (ref 12.0–15.0)
Immature Granulocytes: 0 %
Lymphocytes Relative: 43 %
Lymphs Abs: 3.3 K/uL (ref 0.7–4.0)
MCH: 28.5 pg (ref 26.0–34.0)
MCHC: 32.6 g/dL (ref 30.0–36.0)
MCV: 87.2 fL (ref 80.0–100.0)
Monocytes Absolute: 0.6 K/uL (ref 0.1–1.0)
Monocytes Relative: 7 %
Neutro Abs: 3.7 K/uL (ref 1.7–7.7)
Neutrophils Relative %: 47 %
Platelets: 321 K/uL (ref 150–400)
RBC: 4.92 MIL/uL (ref 3.87–5.11)
RDW: 15.7 % — ABNORMAL HIGH (ref 11.5–15.5)
WBC: 7.8 K/uL (ref 4.0–10.5)
nRBC: 0 % (ref 0.0–0.2)

## 2024-07-20 LAB — GLUCOSE, CAPILLARY
Glucose-Capillary: 104 mg/dL — ABNORMAL HIGH (ref 70–99)
Glucose-Capillary: 154 mg/dL — ABNORMAL HIGH (ref 70–99)
Glucose-Capillary: 83 mg/dL (ref 70–99)
Glucose-Capillary: 94 mg/dL (ref 70–99)

## 2024-07-20 LAB — BASIC METABOLIC PANEL WITH GFR
Anion gap: 11 (ref 5–15)
BUN: 9 mg/dL (ref 8–23)
CO2: 22 mmol/L (ref 22–32)
Calcium: 8.9 mg/dL (ref 8.9–10.3)
Chloride: 104 mmol/L (ref 98–111)
Creatinine, Ser: 0.7 mg/dL (ref 0.44–1.00)
GFR, Estimated: >60 mL/min (ref >60)
Glucose, Bld: 98 mg/dL (ref 70–99)
Potassium: 4.1 mmol/L (ref 3.5–5.1)
Sodium: 137 mmol/L (ref 135–145)

## 2024-07-20 LAB — URINE CULTURE: Culture: >=100000 — AB

## 2024-07-20 MED ORDER — CEFAZOLIN SODIUM-DEXTROSE 1-4 GM/50ML-% IV SOLN
1.0000 g | Freq: Three times a day (TID) | INTRAVENOUS | Status: DC
  Administered 2024-07-21 (×2): 1 g via INTRAVENOUS
  Filled 2024-07-20 (×2): qty 50

## 2024-07-20 MED ORDER — SODIUM CHLORIDE 0.45 % IV SOLN: INTRAVENOUS | Status: AC

## 2024-07-20 MED ORDER — HYDROMORPHONE HCL 1 MG/ML IJ SOLN
0.5000 mg | INTRAMUSCULAR | Status: AC | PRN
  Administered 2024-07-20 (×4): 0.5 mg via INTRAVENOUS
  Filled 2024-07-20 (×2): qty 0.5

## 2024-07-20 MED ORDER — HYDROCODONE-ACETAMINOPHEN 5-325 MG PO TABS
1.0000 | ORAL_TABLET | Freq: Four times a day (QID) | ORAL | Status: DC | PRN
  Administered 2024-07-20 – 2024-07-21 (×4): 1 via ORAL
  Filled 2024-07-20 (×2): qty 1

## 2024-07-20 NOTE — Progress Notes (Signed)
 PROGRESS NOTE  Erle JAYSON Needles  DOB: [birth year]  PCP: Arlyss Eugene BRAVO, NP FMW:[id number]  DOA: 07/18/2024  LOS: 1 day  Hospital Day: 3  Brief narrative: [name] [name] is a 63 y.o. female with PMH significant for DM1 on insulin  pump, HTN, HLD, CHF, vitamin B12 deficiency, peripheral neuropathy, hypothyroidism, GERD, anxiety, IBS, cannabis use GERD last time, patient presented to the ED with complaint of lower abdominal pain, right flank pain with nausea, vomiting  In the ED, patient was afebrile, hemodynamically stable Labs showed WBC count 9.3, lipase normal Urinalysis showed cloudy urine with large leukocyte esterase, rare bacteria CT abdomen/pelvis with contrast showed  -gastroenteritis without evidence of small bowel obstruction or inflammation.  There is a mildly thickened distal descending and sigmoid colon which could be due to underdistention or colitis.  -Possible cystitis with air in the bladder that could be due to recent catheterization or catheterization pain or infectious process.    Patient was started on IV Rocephin , IV antiemetics, IV analgesic, IV fluids. Admitted to [facility]  Subjective: Patient was seen and examined this morning. Lying on bed.  Continues to feel nauseous.  Had 4 episodes of liquid bowel movement yesterday.  On 4 liquid diet.  Wants to advance to regular.  Also complained of some abdominal pain but tenderness appreciated on distracted exam.  Assessment and plan: Intractable nausea, vomiting Presented with abdominal pain, nausea, vomiting CT abdomen with gastroenteritis without SBO.  Mildly thickened wall of distal descending and sigmoid colon could be underdistention or colitis.  Given absence of fever and leukocytosis, I suspect underdistention as the cause.   Currently on full liquid diet.  Wants to tolerate regular diet.  Hopefully diarrhea will improve once starts eating solid Continue PRN analgesics, antiemetic Continue IV fluid for now. Counseled  to quit cannabis Electrolytes normal Continue Protonix  twice daily Recent Labs  Lab 07/18/24 0429 07/19/24 0411 07/20/24 0420  WBC 9.3 7.5 7.8   UTI Urinalysis showed cloudy urine CT abdomen showed possible cystitis with air in the bladder Currently on IV Rocephin   Type 1 diabetes mellitus A1c 8.4 on 05/29/2024 PTA meds-insulin  pump Patient was continued on insulin  pump in the hospital but yesterday, patient stated she ran out of insulin  and did not have supply and hence she was switched to basal and bolus same regimen. Currently on Semglee  10 units daily and SSI with Accu-Cheks. Recent Labs  Lab 07/19/24 1527 07/19/24 1946 07/20/24 0043 07/20/24 0446 07/20/24 0748  GLUCAP 110* 142* 83 94 104*   CAD HLD Continue aspirin  ?? Not on statin  Chronic diastolic CHF HTN No signs of decompensation.   Paroxysmal A-fib Currently in sinus rhythm.  Hypothyroidism Continue Synthroid    Marijuana abuse Cessation advised.   Polycythemia Monitor hematocrit and hemoglobin.   Mobility: Encourage ambulation  Goals of care   Code Status: Full Code     DVT prophylaxis:  enoxaparin  (LOVENOX ) injection 40 mg Start: 07/18/24 2200   Antimicrobials: IV Rocephin  Fluid: Continue half NS at 75 mL/h Consultants: None Family Communication: None at bedside  Status: Observation Level of care:  Med-Surg   Patient is from: Home Needs to continue in-hospital care: Continues to complain of abdominal pain, nausea, diarrhea.  Gradually advancing diet Anticipated d/c to: Hopefully home in 1 to 2 days if improves    Diet:  Diet Order             Diet regular Fluid consistency: Thin  Diet effective now  Scheduled Meds:  enoxaparin  (LOVENOX ) injection  40 mg Subcutaneous Q24H   insulin  aspart  0-5 Units Subcutaneous QHS   insulin  aspart  0-9 Units Subcutaneous TID WC   insulin  glargine-yfgn  10 Units Subcutaneous QHS   metoCLOPramide  (REGLAN ) injection   5 mg Intravenous Q6H    PRN meds: acetaminophen  **OR** acetaminophen , ondansetron  **OR** ondansetron  (ZOFRAN ) IV, prochlorperazine    Infusions:   sodium chloride      cefTRIAXone  (ROCEPHIN )  IV 1 g (07/20/24 0754)    Antimicrobials: Anti-infectives (From admission, onward)    Start     Dose/Rate Route Frequency Ordered Stop   07/19/24 0800  cefTRIAXone  (ROCEPHIN ) 1 g in sodium chloride  0.9 % 100 mL IVPB        1 g 200 mL/hr over 30 Minutes Intravenous Every 24 hours 07/18/24 1055     07/18/24 0815  cefTRIAXone  (ROCEPHIN ) 1 g in sodium chloride  0.9 % 100 mL IVPB        1 g 200 mL/hr over 30 Minutes Intravenous  Once 07/18/24 0804 07/18/24 0843       Objective: Vitals:   07/20/24 0445 07/20/24 0747  BP: (!) 145/92 (!) 156/92  Pulse: 82 92  Resp: 18 18  Temp: 98.1 F (36.7 C) 98.3 F (36.8 C)  SpO2: 100% 100%    Intake/Output Summary (Last 24 hours) at 07/20/2024 1125 Last data filed at 07/20/2024 0305 Gross per 24 hour  Intake 1269.29 ml  Output --  Net 1269.29 ml   Filed Weights   07/18/24 1919  Weight: 54 kg   Weight change:  Body mass index is 24.04 kg/m.   Physical Exam: General exam: Pleasant, Caucasian female Skin: No rashes, lesions or ulcers. HEENT: Atraumatic, normocephalic, no obvious bleeding Lungs: Clear to auscultation bilaterally,  CVS: S1, S2, no murmur,   GI/Abd: Soft, mild diffuse abdominal tenderness more in epigastrium but inconsistent on distracted exam., nondistended, bowel sound present,   CNS: Alert, awake, oriented x 3 Psychiatry: Mood appropriate Extremities: No pedal edema, no calf tenderness,   Data Review: I have personally reviewed the laboratory data and studies available.  F/u labs ordered Unresulted Labs (From admission, onward)    None       Signed, Chapman Rota, MD [facility] 07/20/2024

## 2024-07-20 NOTE — Plan of Care (Signed)
  Problem: Education: Goal: Knowledge of General Education information will improve Description: Including pain rating scale, medication(s)/side effects and non-pharmacologic comfort measures Outcome: Progressing   Problem: Pain Managment: Goal: General experience of comfort will improve and/or be controlled Outcome: Progressing   Problem: Safety: Goal: Ability to remain free from injury will improve Outcome: Progressing   Problem: Coping: Goal: Ability to adjust to condition or change in health will improve Outcome: Progressing

## 2024-07-20 NOTE — Plan of Care (Signed)

## 2024-07-21 DIAGNOSIS — R109 Unspecified abdominal pain: Secondary | ICD-10-CM | POA: Diagnosis not present

## 2024-07-21 LAB — GLUCOSE, CAPILLARY
Glucose-Capillary: 98 mg/dL (ref 70–99)
Glucose-Capillary: 143 mg/dL — ABNORMAL HIGH (ref 70–99)
Glucose-Capillary: 108 mg/dL — ABNORMAL HIGH (ref 70–99)
Glucose-Capillary: 150 mg/dL — ABNORMAL HIGH (ref 70–99)

## 2024-07-21 MED ORDER — CEFADROXIL 500 MG PO CAPS: 500.0000 mg | ORAL_CAPSULE | Freq: Two times a day (BID) | ORAL | 0 refills | Status: DC

## 2024-07-21 MED ORDER — HYDROCODONE-ACETAMINOPHEN 5-325 MG PO TABS: 1.0000 | ORAL_TABLET | Freq: Four times a day (QID) | ORAL | 0 refills | Status: DC | PRN

## 2024-07-21 MED ORDER — MORPHINE SULFATE (PF) 2 MG/ML IV SOLN: 1.0000 mg | Freq: Once | INTRAVENOUS | Status: DC

## 2024-07-21 NOTE — Discharge Summary (Signed)
 Physician Discharge Summary  [name] [name] FMW:[id number] DOB: 1961/04/21 DOA: 07/18/2024  PCP: Arlyss Eugene BRAVO, NP  Admit date: 07/18/2024 Discharge date: 07/21/2024  Admitted from: Home Discharge disposition: Home  Recommendations at discharge:  Complete the course of antibiotics with 3 more days of oral cefadroxil   cautious use of pain meds Follow-up with GI as an outpatient   Brief narrative: [name] [name] is a 63 y.o. female with PMH significant for DM1 on insulin  pump, HTN, HLD, CHF, vitamin B12 deficiency, peripheral neuropathy, hypothyroidism, GERD, anxiety, IBS, cannabis use GERD last time, patient presented to the ED with complaint of lower abdominal pain, right flank pain with nausea, vomiting  In the ED, patient was afebrile, hemodynamically stable Labs showed WBC count 9.3, lipase normal Urinalysis showed cloudy urine with large leukocyte esterase, rare bacteria CT abdomen/pelvis with contrast showed  -gastroenteritis without evidence of small bowel obstruction or inflammation.  There is a mildly thickened distal descending and sigmoid colon which could be due to underdistention or colitis.  -Possible cystitis with air in the bladder that could be due to recent catheterization or catheterization pain or infectious process.    Patient was started on IV Rocephin , IV antiemetics, IV analgesic, IV fluids. Admitted to [facility]  Subjective: Patient was seen and examined this morning. Lying on bed.  Tolerating regular consistency diet.  No nausea.  Diarrhea improved.  Still complains of intermittent abdominal pain on the right upper quadrant.  Manageable with Norco as needed.   Patient states he has an upcoming appointment with GI in [facility].  Hospital course Intractable nausea, vomiting Presented with abdominal pain, nausea, vomiting CT abdomen with gastroenteritis without SBO.  Mildly thickened wall of distal descending and sigmoid colon could be underdistention  or colitis.  Given absence of fever and leukocytosis, I suspect underdistention as the cause.   Patient was hospitalized, given bowel rest, adequate IV hydration.  Slowly started on clear liquid diet and gradually advanced. Currently tolerating regular consistency diet.  No nausea.  Diarrhea improved.   Still complains of intermittent abdominal pain on the right upper quadrant.  Manageable with Norco as needed.   On chart review I noted that patient has had several hospitalizations this year only at [facility] and [place].  She had some other ER visits as well.  All of the symptoms were due to nausea, vomiting, abdominal pain.  Multiple CT scans done have been unremarkable for acute pathology.  Patient also had endoscopy, ERCP and cholecystectomy done as well in the last 1 to 2 years. I do not think her symptoms are alarming of any acute GI pathology.  Could be somatic manifestation. Patient states he has an upcoming appointment with GI in [facility]. Will discharge her home on regular diet and as needed Norco. Counseled to quit cannabis Electrolytes normal Continue Protonix  daily Recent Labs  Lab 07/18/24 0429 07/19/24 0411 07/20/24 0420  WBC 9.3 7.5 7.8   E. coli Urinalysis showed cloudy urine CT abdomen showed possible cystitis with air in the bladder Urine culture grew E. coli. Currently on IV Rocephin .  Plan to discharge on 3 more days of oral cefadroxil  1 g twice daily.  Type 1 diabetes mellitus A1c 8.4 on 05/29/2024 PTA meds-insulin  pump Patient was continued on insulin  pump in the hospital but yesterday, patient stated she ran out of insulin  and did not have supply and hence she was switched to basal and bolus same regimen. Currently on Semglee  10 units daily and SSI with  Accu-Cheks. Resume insulin  pump postdischarge. Recent Labs  Lab 07/20/24 1155 07/20/24 1613 07/20/24 2033 07/21/24 0720 07/21/24 1130  GLUCAP 154* 98 143* 108* 150*   CAD HLD Continue aspirin  ??  Not on statin  Chronic diastolic CHF HTN No signs of decompensation.   Paroxysmal A-fib Currently in sinus rhythm.  Hypothyroidism Continue Synthroid    Marijuana abuse Cessation advised.   Polycythemia Monitor hematocrit and hemoglobin.   Mobility: Encourage ambulation  Goals of care   Code Status: Full Code   Diet:  Diet Order             Diet general           Diet regular Fluid consistency: Thin  Diet effective now                   Nutritional status:  Body mass index is 24.04 kg/m.       Wounds:  -    Discharge Exam:   Vitals:   07/20/24 0747 07/20/24 1920 07/21/24 0256 07/21/24 1132  BP: (!) 156/92 115/83 97/74 121/84  Pulse: 92 86 81 94  Resp: 18 18 18 16   Temp: 98.3 F (36.8 C) 98.5 F (36.9 C) 98.5 F (36.9 C) 98.2 F (36.8 C)  TempSrc: Oral Oral Oral Oral  SpO2: 100% 99% 99% 100%  Weight:      Height:        Body mass index is 24.04 kg/m.  General exam: Pleasant, Caucasian female Skin: No rashes, lesions or ulcers. HEENT: Atraumatic, normocephalic, no obvious bleeding Lungs: Clear to auscultation bilaterally,  CVS: S1, S2, no murmur,   GI/Abd: Soft, mild tenderness more in epigastrium and RUQ, nondistended, bowel sound present,   CNS: Alert, awake, oriented x 3 Psychiatry: Mood appropriate Extremities: No pedal edema, no calf tenderness,   Follow ups:    Follow-up Information     Arlyss Eugene BRAVO, NP Follow up.   Specialty: Family Medicine                Discharge Instructions:   Discharge Instructions     Call MD for:  difficulty breathing, headache or visual disturbances   Complete by: As directed    Call MD for:  extreme fatigue   Complete by: As directed    Call MD for:  hives   Complete by: As directed    Call MD for:  persistant dizziness or light-headedness   Complete by: As directed    Call MD for:  persistant nausea and vomiting   Complete by: As directed    Call MD for:  severe uncontrolled  pain   Complete by: As directed    Call MD for:  temperature >100.4   Complete by: As directed    Diet general   Complete by: As directed    Discharge instructions   Complete by: As directed    Recommendations at discharge:   Complete the course of antibiotics with 3 more days of oral cefadroxil    cautious use of pain meds  Follow-up with GI as an outpatient   Discharge instructions for diabetes mellitus: Check blood sugar 3 times a day and bedtime at home. If blood sugar running above 200 or less than 70 please call your MD to adjust insulin . If you notice signs and symptoms of hypoglycemia (low blood sugar) like jitteriness, confusion, thirst, tremor and sweating, please check blood sugar, drink sugary drink/biscuits/sweets to increase sugar level and call MD or return to ER.  PDMP reviewed this encounter.   Opioid taper instructions: It is important to wean off of your opioid medication as soon as possible. If you do not need pain medication after your surgery it is ok to stop day one. Opioids include: Codeine, Hydrocodone (Norco, Vicodin), Oxycodone (Percocet, oxycontin ) and hydromorphone  amongst others.  Long term and even short term use of opiods can cause: Increased pain response Dependence Constipation Depression Respiratory depression And more.  Withdrawal symptoms can include Flu like symptoms Nausea, vomiting And more Techniques to manage these symptoms Hydrate well Eat regular healthy meals Stay active Use relaxation techniques(deep breathing, meditating, yoga) Do Not substitute Alcohol to help with tapering If you have been on opioids for less than two weeks and do not have pain than it is ok to stop all together.  Plan to wean off of opioids This plan should start within one week post op of your joint replacement. Maintain the same interval or time between taking each dose and first decrease the dose.  Cut the total daily intake of opioids by one  tablet each day Next start to increase the time between doses. The last dose that should be eliminated is the evening dose.        General discharge instructions: Follow with Primary MD Arlyss Eugene BRAVO, NP in 7 days  Please request your PCP  to go over your hospital tests, procedures, radiology results at the follow up. Please get your medicines reviewed and adjusted.  Your PCP may decide to repeat certain labs or tests as needed. Do not drive, operate heavy machinery, perform activities at heights, swimming or participation in water activities or provide baby sitting services if your were admitted for syncope or siezures until you have seen by Primary MD or a Neurologist and advised to do so again. [place]  Controlled Substance Reporting System database was reviewed. Do not drive, operate heavy machinery, perform activities at heights, swim, participate in water activities or provide baby-sitting services while on medications for pain, sleep and mood until your outpatient physician has reevaluated you and advised to do so again.  You are strongly recommended to comply with the dose, frequency and duration of prescribed medications. Activity: As tolerated with Full fall precautions use walker/cane & assistance as needed Avoid using any recreational substances like cigarette, tobacco, alcohol, or non-prescribed drug. If you experience worsening of your admission symptoms, develop shortness of breath, life threatening emergency, suicidal or homicidal thoughts you must seek medical attention immediately by calling 911 or calling your MD immediately  if symptoms less severe. You must read complete instructions/literature along with all the possible adverse reactions/side effects for all the medicines you take and that have been prescribed to you. Take any new medicine only after you have completely understood and accepted all the possible adverse reactions/side effects.  Wear Seat belts while  driving. You were cared for by a hospitalist during your hospital stay. If you have any questions about your discharge medications or the care you received while you were in the hospital after you are discharged, you can call the unit and ask to speak with the hospitalist or the covering physician. Once you are discharged, your primary care physician will handle any further medical issues. Please note that NO REFILLS for any discharge medications will be authorized once you are discharged, as it is imperative that you return to your primary care physician (or establish a relationship with a primary care physician if you do not have one).   Increase activity  slowly   Complete by: As directed        Discharge Medications:   Allergies as of 07/21/2024       Reactions   Codeine Hives, Anxiety   Ciprofloxacin  Diarrhea, Nausea Only, Other (See Comments)   Caused extreme nausea   Flagyl  [metronidazole ] Nausea Only, Other (See Comments)   Can tolerate ONLY VIA IV (otherwise, SEVERE NAUSEA)        Medication List     STOP taking these medications    midodrine  5 MG tablet Commonly known as: PROAMATINE    oxyCODONE  5 MG immediate release tablet Commonly known as: Oxy IR/ROXICODONE        TAKE these medications    acetaminophen  650 MG suppository Commonly known as: TYLENOL  Place 1 suppository (650 mg total) rectally every 6 (six) hours as needed for mild pain (pain score 1-3) or fever (or Fever >/= 101).   aspirin  EC 81 MG tablet Take 81 mg by mouth daily. Swallow whole.   azelastine  0.1 % nasal spray Commonly known as: ASTELIN  Place 1 spray into both nostrils 2 (two) times daily as needed for rhinitis or allergies.   Baqsimi One Pack 3 MG/DOSE Powd Generic drug: Glucagon Place 3 mg into the nose as needed (severe low blood sugar).   cefadroxil  500 MG capsule Commonly known as: DURICEF Take 1 capsule (500 mg total) by mouth 2 (two) times daily for 5 days.   Dexcom G7 Sensor  Misc Inject 1 each into the skin See admin instructions. Place 1 new sensor into the skin every 10 days   gabapentin  100 MG capsule Commonly known as: NEURONTIN  Take 100 mg by mouth at bedtime.   Glucagon Emergency 1 MG Kit Inject 1 mg into the skin See admin instructions. Inject one mg into the skin see administration instructions. Follow package directions for low blood sugar.   HYDROcodone -acetaminophen  5-325 MG tablet Commonly known as: NORCO/VICODIN Take 1 tablet by mouth every 6 (six) hours as needed for up to 5 days for moderate pain (pain score 4-6).   levothyroxine  150 MCG tablet Commonly known as: SYNTHROID  Take 1 tablet (150 mcg total) by mouth daily before breakfast.   NovoLOG  FlexPen 100 UNIT/ML FlexPen Generic drug: insulin  aspart Inject 10 Units into the skin 3 (three) times daily with meals. Plus sliding scale TID What changed:  when to take this additional instructions   omega-3 acid ethyl esters 1 g capsule Commonly known as: LOVAZA  Take 2 capsules (2 g total) by mouth 2 (two) times daily.   Omnipod 5 G7 Pods (Gen 5) Misc Inject 1 Device into the skin every 3 (three) days.   ondansetron  4 MG tablet Commonly known as: ZOFRAN  Take 2 tablets (8 mg total) by mouth every 8 (eight) hours as needed for nausea or vomiting.   pantoprazole  40 MG tablet Commonly known as: Protonix  Take 1 tablet (40 mg total) by mouth 2 (two) times daily. Take 30 minutes before breakfast and dinner What changed:  when to take this additional instructions   promethazine  25 MG suppository Commonly known as: PHENERGAN  Place 25 mg rectally every 6 (six) hours as needed for nausea or vomiting.   promethazine  25 MG tablet Commonly known as: PHENERGAN  Take 1 tablet (25 mg total) by mouth every 6 (six) hours as needed for nausea or vomiting (to take instead of rectal suppository).   sucralfate  1 g tablet Commonly known as: CARAFATE  Take 1 tablet (1 g total) by mouth 2 (two) times  daily. Do not  take any other medication within 2-4 hours.   tiZANidine 4 MG tablet Commonly known as: ZANAFLEX Take 4 mg by mouth every 8 (eight) hours as needed for muscle spasms.   Toujeo  SoloStar 300 UNIT/ML Solostar Pen Generic drug: insulin  glargine (1 Unit Dial ) Inject 25 Units into the skin at bedtime. What changed:  when to take this additional instructions         The results of significant diagnostics from this hospitalization (including imaging, microbiology, ancillary and laboratory) are listed below for reference.    Procedures and Diagnostic Studies:   CT ABDOMEN PELVIS W CONTRAST Result Date: 07/18/2024 CLINICAL DATA:  Abdominal pain, acute, nonlocalized. EXAM: CT ABDOMEN AND PELVIS WITH CONTRAST TECHNIQUE: Multidetector CT imaging of the abdomen and pelvis was performed using the standard protocol following bolus administration of intravenous contrast. RADIATION DOSE REDUCTION: This exam was performed according to the departmental dose-optimization program which includes automated exposure control, adjustment of the mA and/or kV according to patient size and/or use of iterative reconstruction technique. CONTRAST:  OMNIPAQUE  IOHEXOL  300 MG/ML  SOLN COMPARISON:  Large number of prior CTs back to 2009. The 3 most recent are all with contrast and dated 05/28/2024, 06/04/2024, and 06/25/2024. FINDINGS: Lower chest: Cardiac size is normal. The lung bases are clear. There is again noted thickening in the distal thoracic esophagus. Etiology typically reflux. Hepatobiliary: The liver is 20 cm length mildly steatotic, including with focal periligamentous fat in segment 4B. there is no mass enhancement. The gallbladder is absent. There is chronic prominence in the common bile duct which measures 10 mm, stable. There is also stable mild intrahepatic biliary prominence. Pancreas: No abnormality. Spleen: No abnormality. Adrenals/Urinary Tract: Adrenal glands are unremarkable. Kidneys  are normal, without renal calculi, focal lesion, or hydronephrosis. Bladder is mildly thickened in general without masslike thickening. Correlate clinically for cystitis. There is air in the anterior bladder which could be due to recent catheterization, catheterization attempt, or infectious process. There is a small diverticulum off the left posterolateral wall. Stomach/Bowel: There is moderate fluid distension of the stomach. Mild gastric thickened folds and mucosal enhancement consistent with gastritis. Normal caliber small bowel, with portions of the mid and lower abdominal small bowel also showing mucosal enhancement. There is no small bowel dilatation or mesenteric inflammatory change. There is a normal caliber appendix. The colon wall is unremarkable until the distal descending and sigmoid segments which are mildly thickened versus exaggerated from underdistension. Correlate clinically for underlying colitis. There is diffuse colonic diverticulosis. Vascular/Lymphatic: Aortic atherosclerosis. No enlarged abdominal or pelvic lymph nodes. Reproductive: Status post hysterectomy. No adnexal masses. Other: Small umbilical fat hernia. No incarcerated hernia. No free fluid, free hemorrhage free air. Musculoskeletal: Mild degenerative of spine. No acute or significant osseous findings. IMPRESSION: 1. Gastroenteritis without evidence of small-bowel obstruction or inflammation. 2. Mildly thickened distal descending and sigmoid colon which could be due to underdistention or colitis. 3. Possible cystitis. Also there is air in the bladder which could be due to recent catheterization, catheterization attempt, or infectious process. 4. Mildly prominent intrahepatic and extrahepatic bile ducts, stable. 5. Mildly prominent liver with mild steatosis. 6. Aortic atherosclerosis. 7. Small umbilical fat hernia. Aortic Atherosclerosis (ICD10-I70.0). Electronically Signed   By: Francis Quam M.D.   On: 07/18/2024 07:16      Labs:   Basic Metabolic Panel: Recent Labs  Lab 07/18/24 0533 07/19/24 0411 07/20/24 0420  NA 139 136 137  K 3.8 3.8 4.1  CL 101 104 104  CO2  26 21* 22  GLUCOSE 177* 92 98  BUN 20 13 9   CREATININE 0.65 0.84 0.70  CALCIUM  8.7* 8.2* 8.9  MG 2.6*  --   --   PHOS 3.5  --   --    GFR Estimated Creatinine Clearance: 55.4 mL/min (by C-G formula based on SCr of 0.7 mg/dL). Liver Function Tests: Recent Labs  Lab 07/18/24 0533  AST 17  ALT 15  ALKPHOS 72  BILITOT 1.0  PROT 7.5  ALBUMIN 3.8   Recent Labs  Lab 07/18/24 0533  LIPASE 26   No results for input(s): AMMONIA in the last 168 hours. Coagulation profile No results for input(s): INR, PROTIME in the last 168 hours.  CBC: Recent Labs  Lab 07/18/24 0429 07/19/24 0411 07/20/24 0420  WBC 9.3 7.5 7.8  NEUTROABS 6.6  --  3.7  HGB 15.9* 12.8 14.0  HCT 50.9* 39.9 42.9  MCV 88.2 87.5 87.2  PLT 373 294 321   Cardiac Enzymes: No results for input(s): CKTOTAL, CKMB, CKMBINDEX, TROPONINI in the last 168 hours. BNP: Invalid input(s): POCBNP CBG: Recent Labs  Lab 07/20/24 1155 07/20/24 1613 07/20/24 2033 07/21/24 0720 07/21/24 1130  GLUCAP 154* 98 143* 108* 150*   D-Dimer No results for input(s): DDIMER in the last 72 hours. Hgb A1c No results for input(s): HGBA1C in the last 72 hours. Lipid Profile No results for input(s): CHOL, HDL, LDLCALC, TRIG, CHOLHDL, LDLDIRECT in the last 72 hours. Thyroid  function studies No results for input(s): TSH, T4TOTAL, T3FREE, THYROIDAB in the last 72 hours.  Invalid input(s): FREET3 Anemia work up No results for input(s): VITAMINB12, FOLATE, FERRITIN, TIBC, IRON, RETICCTPCT in the last 72 hours. Microbiology Recent Results (from the past 240 hours)  Urine Culture     Status: Abnormal   Collection Time: 07/18/24  6:30 AM   Specimen: Urine, Random  Result Value Ref Range Status   Specimen Description   Final     URINE, RANDOM Performed at [facility], [address] [place], [place] [address]    Special Requests   Final    NONE Performed at [facility], [address], [place], [place] [address]    Culture >=100,000 COLONIES/mL ESCHERICHIA COLI (A)  Final   Report Status 07/20/2024 FINAL  Final   Organism ID, Bacteria ESCHERICHIA COLI (A)  Final      Susceptibility   Escherichia coli - MIC*    AMPICILLIN  16 INTERMEDIATE Intermediate     CEFAZOLIN  <=4 SENSITIVE Sensitive     CEFEPIME  <=0.12 SENSITIVE Sensitive     CEFTRIAXONE  <=0.25 SENSITIVE Sensitive     CIPROFLOXACIN  >=4 RESISTANT Resistant     GENTAMICIN <=1 SENSITIVE Sensitive     IMIPENEM <=0.25 SENSITIVE Sensitive     NITROFURANTOIN  <=16 SENSITIVE Sensitive     TRIMETH /SULFA  <=20 SENSITIVE Sensitive     AMPICILLIN /SULBACTAM 8 SENSITIVE Sensitive     PIP/TAZO 8 SENSITIVE Sensitive ug/mL    * >=100,000 COLONIES/mL ESCHERICHIA COLI    Time coordinating discharge: 45 minutes  Signed: Kamee Bobst  [facility] 07/21/2024, 1:40 PM

## 2024-07-21 NOTE — Plan of Care (Signed)
  Problem: Education: Goal: Knowledge of General Education information will improve Description: Including pain rating scale, medication(s)/side effects and non-pharmacologic comfort measures Outcome: Adequate for Discharge   Problem: Health Behavior/Discharge Planning: Goal: Ability to manage health-related needs will improve Outcome: Adequate for Discharge   Problem: Clinical Measurements: Goal: Ability to maintain clinical measurements within normal limits will improve Outcome: Adequate for Discharge Goal: Will remain free from infection Outcome: Adequate for Discharge Goal: Diagnostic test results will improve Outcome: Adequate for Discharge Goal: Respiratory complications will improve Outcome: Adequate for Discharge Goal: Cardiovascular complication will be avoided Outcome: Adequate for Discharge   Problem: Activity: Goal: Risk for activity intolerance will decrease Outcome: Adequate for Discharge   Problem: Nutrition: Goal: Adequate nutrition will be maintained Outcome: Adequate for Discharge   Problem: Coping: Goal: Level of anxiety will decrease Outcome: Adequate for Discharge   Problem: Elimination: Goal: Will not experience complications related to bowel motility Outcome: Adequate for Discharge Goal: Will not experience complications related to urinary retention Outcome: Adequate for Discharge   Problem: Pain Managment: Goal: General experience of comfort will improve and/or be controlled Outcome: Adequate for Discharge   Problem: Skin Integrity: Goal: Risk for impaired skin integrity will decrease Outcome: Adequate for Discharge   Problem: Safety: Goal: Ability to remain free from injury will improve Outcome: Adequate for Discharge   Problem: Education: Goal: Ability to describe self-care measures that may prevent or decrease complications (Diabetes Survival Skills Education) will improve Outcome: Adequate for Discharge Goal: Individualized Educational  Video(s) Outcome: Adequate for Discharge   Problem: Fluid Volume: Goal: Ability to maintain a balanced intake and output will improve Outcome: Adequate for Discharge   Problem: Health Behavior/Discharge Planning: Goal: Ability to identify and utilize available resources and services will improve Outcome: Adequate for Discharge Goal: Ability to manage health-related needs will improve Outcome: Adequate for Discharge   Problem: Skin Integrity: Goal: Risk for impaired skin integrity will decrease Outcome: Adequate for Discharge   Problem: Tissue Perfusion: Goal: Adequacy of tissue perfusion will improve Outcome: Adequate for Discharge

## 2024-07-21 NOTE — Plan of Care (Signed)

## 2024-07-23 ENCOUNTER — Inpatient Hospital Stay (HOSPITAL_COMMUNITY)
Admission: EM | Admit: 2024-07-23 | Discharge: 2024-07-27 | DRG: 312 | Disposition: A | Discharge status: Home / Self Care | Attending: Internal Medicine | Admitting: Internal Medicine

## 2024-07-23 ENCOUNTER — Other Ambulatory Visit: Payer: Self-pay

## 2024-07-23 ENCOUNTER — Emergency Department (HOSPITAL_COMMUNITY)

## 2024-07-23 DIAGNOSIS — M810 Age-related osteoporosis without current pathological fracture: Secondary | ICD-10-CM | POA: Diagnosis present

## 2024-07-23 DIAGNOSIS — Z885 Allergy status to narcotic agent status: Secondary | ICD-10-CM

## 2024-07-23 DIAGNOSIS — Z881 Allergy status to other antibiotic agents status: Secondary | ICD-10-CM

## 2024-07-23 DIAGNOSIS — S82832A Other fracture of upper and lower end of left fibula, initial encounter for closed fracture: Secondary | ICD-10-CM | POA: Diagnosis present

## 2024-07-23 DIAGNOSIS — E785 Hyperlipidemia, unspecified: Secondary | ICD-10-CM | POA: Diagnosis present

## 2024-07-23 DIAGNOSIS — K219 Gastro-esophageal reflux disease without esophagitis: Secondary | ICD-10-CM | POA: Diagnosis present

## 2024-07-23 DIAGNOSIS — M545 Low back pain, unspecified: Secondary | ICD-10-CM | POA: Diagnosis present

## 2024-07-23 DIAGNOSIS — Z820 Family history of epilepsy and other diseases of the nervous system: Secondary | ICD-10-CM

## 2024-07-23 DIAGNOSIS — E860 Dehydration: Secondary | ICD-10-CM | POA: Diagnosis present

## 2024-07-23 DIAGNOSIS — Z87891 Personal history of nicotine dependence: Secondary | ICD-10-CM

## 2024-07-23 DIAGNOSIS — Z79899 Other long term (current) drug therapy: Secondary | ICD-10-CM

## 2024-07-23 DIAGNOSIS — W1830XA Fall on same level, unspecified, initial encounter: Secondary | ICD-10-CM | POA: Diagnosis present

## 2024-07-23 DIAGNOSIS — Z794 Long term (current) use of insulin: Secondary | ICD-10-CM

## 2024-07-23 DIAGNOSIS — Z9851 Tubal ligation status: Secondary | ICD-10-CM

## 2024-07-23 DIAGNOSIS — Z560 Unemployment, unspecified: Secondary | ICD-10-CM

## 2024-07-23 DIAGNOSIS — Z90710 Acquired absence of both cervix and uterus: Secondary | ICD-10-CM

## 2024-07-23 DIAGNOSIS — I2510 Atherosclerotic heart disease of native coronary artery without angina pectoris: Secondary | ICD-10-CM | POA: Diagnosis present

## 2024-07-23 DIAGNOSIS — I428 Other cardiomyopathies: Secondary | ICD-10-CM | POA: Diagnosis present

## 2024-07-23 DIAGNOSIS — E1022 Type 1 diabetes mellitus with diabetic chronic kidney disease: Secondary | ICD-10-CM | POA: Diagnosis present

## 2024-07-23 DIAGNOSIS — Z79890 Hormone replacement therapy (postmenopausal): Secondary | ICD-10-CM

## 2024-07-23 DIAGNOSIS — Z9641 Presence of insulin pump (external) (internal): Secondary | ICD-10-CM | POA: Diagnosis present

## 2024-07-23 DIAGNOSIS — E039 Hypothyroidism, unspecified: Secondary | ICD-10-CM | POA: Diagnosis present

## 2024-07-23 DIAGNOSIS — E1065 Type 1 diabetes mellitus with hyperglycemia: Secondary | ICD-10-CM | POA: Diagnosis present

## 2024-07-23 DIAGNOSIS — Y92091 Bathroom in other non-institutional residence as the place of occurrence of the external cause: Secondary | ICD-10-CM

## 2024-07-23 DIAGNOSIS — N179 Acute kidney failure, unspecified: Secondary | ICD-10-CM | POA: Diagnosis present

## 2024-07-23 DIAGNOSIS — T383X6A Underdosing of insulin and oral hypoglycemic [antidiabetic] drugs, initial encounter: Secondary | ICD-10-CM | POA: Diagnosis present

## 2024-07-23 DIAGNOSIS — I951 Orthostatic hypotension: Principal | ICD-10-CM | POA: Diagnosis present

## 2024-07-23 DIAGNOSIS — Z9049 Acquired absence of other specified parts of digestive tract: Secondary | ICD-10-CM

## 2024-07-23 DIAGNOSIS — R55 Syncope and collapse: Principal | ICD-10-CM | POA: Diagnosis present

## 2024-07-23 DIAGNOSIS — R0789 Other chest pain: Secondary | ICD-10-CM | POA: Diagnosis present

## 2024-07-23 DIAGNOSIS — Z8249 Family history of ischemic heart disease and other diseases of the circulatory system: Secondary | ICD-10-CM

## 2024-07-23 DIAGNOSIS — E109 Type 1 diabetes mellitus without complications: Secondary | ICD-10-CM | POA: Diagnosis present

## 2024-07-23 DIAGNOSIS — E1042 Type 1 diabetes mellitus with diabetic polyneuropathy: Secondary | ICD-10-CM | POA: Diagnosis present

## 2024-07-23 DIAGNOSIS — G43909 Migraine, unspecified, not intractable, without status migrainosus: Secondary | ICD-10-CM | POA: Diagnosis present

## 2024-07-23 DIAGNOSIS — Z7982 Long term (current) use of aspirin: Secondary | ICD-10-CM

## 2024-07-23 DIAGNOSIS — G8929 Other chronic pain: Secondary | ICD-10-CM | POA: Diagnosis present

## 2024-07-23 DIAGNOSIS — I6521 Occlusion and stenosis of right carotid artery: Secondary | ICD-10-CM | POA: Diagnosis present

## 2024-07-23 DIAGNOSIS — I5030 Unspecified diastolic (congestive) heart failure: Secondary | ICD-10-CM | POA: Diagnosis present

## 2024-07-23 DIAGNOSIS — D72829 Elevated white blood cell count, unspecified: Secondary | ICD-10-CM | POA: Diagnosis present

## 2024-07-23 DIAGNOSIS — I130 Hypertensive heart and chronic kidney disease with heart failure and stage 1 through stage 4 chronic kidney disease, or unspecified chronic kidney disease: Secondary | ICD-10-CM | POA: Diagnosis present

## 2024-07-23 DIAGNOSIS — K589 Irritable bowel syndrome without diarrhea: Secondary | ICD-10-CM | POA: Diagnosis present

## 2024-07-23 DIAGNOSIS — N182 Chronic kidney disease, stage 2 (mild): Secondary | ICD-10-CM | POA: Diagnosis present

## 2024-07-23 DIAGNOSIS — Z87440 Personal history of urinary (tract) infections: Secondary | ICD-10-CM

## 2024-07-23 DIAGNOSIS — Z833 Family history of diabetes mellitus: Secondary | ICD-10-CM

## 2024-07-23 DIAGNOSIS — Z91148 Patient's other noncompliance with medication regimen for other reason: Secondary | ICD-10-CM

## 2024-07-23 DIAGNOSIS — K2950 Unspecified chronic gastritis without bleeding: Secondary | ICD-10-CM | POA: Diagnosis present

## 2024-07-23 DIAGNOSIS — Z9181 History of falling: Secondary | ICD-10-CM

## 2024-07-23 DIAGNOSIS — R112 Nausea with vomiting, unspecified: Secondary | ICD-10-CM | POA: Diagnosis present

## 2024-07-23 DIAGNOSIS — M546 Pain in thoracic spine: Secondary | ICD-10-CM | POA: Diagnosis present

## 2024-07-23 DIAGNOSIS — S82402A Unspecified fracture of shaft of left fibula, initial encounter for closed fracture: Secondary | ICD-10-CM | POA: Insufficient documentation

## 2024-07-23 DIAGNOSIS — Z9889 Other specified postprocedural states: Secondary | ICD-10-CM

## 2024-07-23 DIAGNOSIS — Z8619 Personal history of other infectious and parasitic diseases: Secondary | ICD-10-CM

## 2024-07-23 MED ORDER — PROMETHAZINE HCL 25 MG PO TABS
25.0000 mg | ORAL_TABLET | Freq: Once | ORAL | Status: AC
  Administered 2024-07-23: 25 mg via ORAL
  Filled 2024-07-23: qty 1

## 2024-07-23 MED ORDER — SODIUM CHLORIDE 0.9 % IV BOLUS
1000.0000 mL | Freq: Once | INTRAVENOUS | Status: AC
  Administered 2024-07-24: 1000 mL via INTRAVENOUS

## 2024-07-23 MED ORDER — HYDROMORPHONE HCL 1 MG/ML IJ SOLN
0.5000 mg | INTRAMUSCULAR | Status: DC | PRN
  Administered 2024-07-23 – 2024-07-26 (×11): 0.5 mg via INTRAVENOUS
  Filled 2024-07-23 (×3): qty 0.5
  Filled 2024-07-23: qty 1
  Filled 2024-07-23 (×6): qty 0.5
  Filled 2024-07-23: qty 1

## 2024-07-23 NOTE — ED Provider Notes (Signed)
 Emergency Department Provider Note   I have reviewed the triage vital signs and the nursing notes.   HISTORY  Chief Complaint No chief complaint on file.   HPI [name] [name] is a 63 y.o. female with history reviewed below including IBS, chronic gastritis, diabetes presents to the emergency department for evaluation after syncope.  Patient was discharged from the hospital 2 days prior with gastritis and vomiting.  She states she did not feel ready for discharge and continues to feel weak and have cramping abdominal pain and vomiting at home.  She states she is been in bed for the past 24 hours feeling sick and having poor p.o. intake.  She had 3 episodes today when she fell due to weakness with 1 episode of syncope.  She states she felt lightheaded and passed out briefly striking the floor.  She has headache, mid back pain, lower back pain.  No numbness or weakness.  Abdominal pain is baseline.  No chest pain or shortness of breath.  She did not experience any palpitations, shortness of breath, sudden severe headache prior to falling.   Past Medical History:  Diagnosis Date   Allergy    seasonal   Anxiety    B12 deficiency    Chest pain 04/05/2017   CHF (congestive heart failure) ([name])    Diabetes mellitus type 1, uncontrolled    dr. 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 Respiratory: Denies shortness of breath. Gastrointestinal:  Cramping abdominal pain. Positive nausea and vomiting.   Genitourinary: Negative for dysuria. Musculoskeletal: Positive neck and back pain.  Skin: Negative for rash. Neurological: Negative for headaches, focal weakness or numbness.   ____________________________________________   PHYSICAL EXAM:  VITAL SIGNS: ED Triage Vitals  Encounter Vitals Group     BP 07/23/24 2258 121/73     Pulse Rate 07/23/24 2258 83     Resp 07/23/24 2258 15     Temp 07/23/24 2258 98.1 F (36.7 C)     Temp Source 07/23/24 2258 Oral     SpO2 07/23/24 2258 100 %     Weight 07/23/24 2309 119 lb (54 kg)     Height 07/23/24 2309 4' 11 (1.499 m)   Constitutional: Alert and oriented. Well appearing and in no acute distress. Eyes: Conjunctivae are normal.  Head: Atraumatic. Nose: No congestion/rhinnorhea. Mouth/Throat: Mucous membranes are moist.   Neck: No stridor.  C collar in place.  Cardiovascular: Normal rate, regular rhythm. Good peripheral circulation. Grossly normal heart sounds.   Respiratory: Normal respiratory effort.  No retractions. Lungs CTAB. Gastrointestinal: Soft and nontender. No distention.  Musculoskeletal: No lower extremity tenderness nor edema. No gross deformities of extremities.  Midline and paraspinal tenderness in the thoracic and lumbar spine.  No step-off. Neurologic:  Normal speech and language. No gross focal neurologic deficits are appreciated.  Skin:  Skin is warm, dry and intact. No rash noted.  ____________________________________________   LABS (all labs ordered are listed, but only abnormal results are displayed)  Labs Reviewed  COMPREHENSIVE METABOLIC PANEL WITH GFR - Abnormal; Notable for the following components:      Result Value   Chloride 94 (*)    Glucose, Bld 200 (*)    BUN 29 (*)    Creatinine, Ser 1.77 (*)    GFR, Estimated 32 (*)    All other components within normal limits  CBC WITH DIFFERENTIAL/PLATELET - Abnormal; Notable for the following  components:   WBC 14.2 (*)    RBC 5.12 (*)    RDW 16.2 (*)    Neutro Abs 10.1 (*)    Monocytes Absolute 1.2 (*)    All other components within normal limits  LIPASE, BLOOD   ____________________________________________  EKG   EKG Interpretation Date/Time:  Friday July 24 2024 00:54:52 EDT Ventricular Rate:  84 PR Interval:  146 QRS Duration:  74 QT Interval:  360 QTC Calculation: 425 R Axis:   98  Text Interpretation: Normal sinus rhythm Rightward axis Nonspecific T wave abnormality Abnormal ECG When compared with ECG of 18-Jul-2024 04:11, PREVIOUS ECG IS PRESENT Confirmed by Darra Chew [phone]) on 07/24/2024 12:57:19 AM        ____________________________________________  RADIOLOGY  DG Ankle 2 Views Left Result Date: 07/24/2024 CLINICAL DATA:  Fall EXAM: LEFT ANKLE - 2 VIEW COMPARISON:  None Available. FINDINGS: Acute, oblique, minimally displaced, anatomically aligned fracture of the distal left fibular metaphyseal region extending to the level of the tibial plafond is noted. Normal overall alignment. Ankle mortise appears preserved. No ankle effusion. Extensive soft tissue swelling superficial to the lateral malleolus. Vascular calcifications noted. IMPRESSION: 1. Acute, minimally displaced, anatomically aligned fracture of the distal left fibular metaphyseal region. Electronically Signed   By: Dorethia Molt M.D.   On: 07/24/2024 03:15   DG Knee 1-2 Views Left Result Date: 07/24/2024 CLINICAL DATA:  Fall EXAM: LEFT KNEE - 1-2 VIEW COMPARISON:  None Available. FINDINGS: No evidence of fracture, dislocation, or joint effusion. No evidence of arthropathy or other focal bone abnormality. Vascular calcifications noted. IMPRESSION: Negative. Electronically Signed   By: Dorethia Molt M.D.   On: 07/24/2024 03:14   DG Pelvis 1-2 Views Result Date: 07/24/2024 CLINICAL DATA:  Fall EXAM: PELVIS - 1-2 VIEW COMPARISON:  None Available. FINDINGS: There is no evidence of pelvic fracture  or diastasis. No pelvic bone lesions are seen. IMPRESSION: Negative. Electronically Signed   By: Dorethia Molt M.D.   On: 07/24/2024 03:14   CT Thoracic Spine Wo Contrast Result Date: 07/24/2024 CLINICAL DATA:  Multiple falls with back pain, initial encounter EXAM: CT THORACIC AND LUMBAR SPINE WITHOUT CONTRAST TECHNIQUE: Multidetector CT imaging of the thoracic and lumbar spine was performed without contrast. Multiplanar CT image reconstructions were also generated. RADIATION DOSE REDUCTION: This exam was performed according to the departmental dose-optimization program which includes automated exposure control, adjustment of the mA and/or kV according to patient size and/or use of iterative reconstruction technique. COMPARISON:  07/18/2024 FINDINGS: CT THORACIC SPINE FINDINGS Alignment: Normal. Vertebrae: 12 thoracic vertebra are noted. Vertebral body height is well maintained. No compression deformity is seen. Minimal osteophytic changes are seen. No acute fracture is noted. Paraspinal and other soft tissues: No significant soft tissue abnormality is noted. The visualize ribcage is within normal limits. Disc levels: No specific disc pathology is noted. CT LUMBAR SPINE FINDINGS Segmentation: 5 lumbar type vertebrae. Alignment: Alignment is within normal limits. Vertebrae: Vertebral body height  is well maintained. No compression deformity is seen. Very mild osteophytic changes are seen. No acute fracture or acute facet abnormality is noted. Paraspinal and other soft tissues: Surrounding soft tissue structures show no acute abnormality. Disc levels: No specific disc pathology is noted. IMPRESSION: CT THORACIC SPINE IMPRESSION Mild degenerative changes without acute abnormality. CT LUMBAR SPINE IMPRESSION Mild degenerative change without acute abnormality. Electronically Signed   By: Oneil Devonshire M.D.   On: 07/24/2024 00:34   CT Lumbar Spine Wo Contrast Result Date: 07/24/2024 CLINICAL DATA:  Multiple falls  with back pain, initial encounter EXAM: CT THORACIC AND LUMBAR SPINE WITHOUT CONTRAST TECHNIQUE: Multidetector CT imaging of the thoracic and lumbar spine was performed without contrast. Multiplanar CT image reconstructions were also generated. RADIATION DOSE REDUCTION: This exam was performed according to the departmental dose-optimization program which includes automated exposure control, adjustment of the mA and/or kV according to patient size and/or use of iterative reconstruction technique. COMPARISON:  07/18/2024 FINDINGS: CT THORACIC SPINE FINDINGS Alignment: Normal. Vertebrae: 12 thoracic vertebra are noted. Vertebral body height is well maintained. No compression deformity is seen. Minimal osteophytic changes are seen. No acute fracture is noted. Paraspinal and other soft tissues: No significant soft tissue abnormality is noted. The visualize ribcage is within normal limits. Disc levels: No specific disc pathology is noted. CT LUMBAR SPINE FINDINGS Segmentation: 5 lumbar type vertebrae. Alignment: Alignment is within normal limits. Vertebrae: Vertebral body height is well maintained. No compression deformity is seen. Very mild osteophytic changes are seen. No acute fracture or acute facet abnormality is noted. Paraspinal and other soft tissues: Surrounding soft tissue structures show no acute abnormality. Disc levels: No specific disc pathology is noted. IMPRESSION: CT THORACIC SPINE IMPRESSION Mild degenerative changes without acute abnormality. CT LUMBAR SPINE IMPRESSION Mild degenerative change without acute abnormality. Electronically Signed   By: Oneil Devonshire M.D.   On: 07/24/2024 00:34   CT Head Wo Contrast Result Date: 07/24/2024 CLINICAL DATA:  History of recent falls with dizziness and neck pain, initial encounter EXAM: CT HEAD WITHOUT CONTRAST CT CERVICAL SPINE WITHOUT CONTRAST TECHNIQUE: Multidetector CT imaging of the head and cervical spine was performed following the standard protocol  without intravenous contrast. Multiplanar CT image reconstructions of the cervical spine were also generated. RADIATION DOSE REDUCTION: This exam was performed according to the departmental dose-optimization program which includes automated exposure control, adjustment of the mA and/or kV according to patient size and/or use of iterative reconstruction technique. COMPARISON:  None Available. FINDINGS: CT HEAD FINDINGS Brain: No evidence of acute infarction, hemorrhage, hydrocephalus, extra-axial collection or mass lesion/mass effect. Vascular: No hyperdense vessel or unexpected calcification. Skull: Normal. Negative for fracture or focal lesion. Sinuses/Orbits: No acute finding. Other: None. CT CERVICAL SPINE FINDINGS Alignment: Mild straightening of the normal cervical lordosis. Skull base and vertebrae: 7 cervical segments are well visualized. Vertebral body height is well maintained. Mild osteophytic changes are noted throughout the cervical spine. Mild central disc bulging is noted at C3-4 and mild left paracentral disc bulging at C5-6. No acute fracture or acute facet abnormality is noted. The odontoid is within normal limits. Soft tissues and spinal canal: Surrounding soft tissue structures are within normal limits. Upper chest: Visualized lung apices are unremarkable. Other: None IMPRESSION: CT of the head: No acute intracranial abnormality noted. CT of the cervical spine: Multilevel degenerative changes as described no acute abnormality noted. Electronically Signed   By: Oneil Devonshire M.D.   On: 07/24/2024 00:26   CT Cervical Spine Wo  Contrast Result Date: 07/24/2024 CLINICAL DATA:  History of recent falls with dizziness and neck pain, initial encounter EXAM: CT HEAD WITHOUT CONTRAST CT CERVICAL SPINE WITHOUT CONTRAST TECHNIQUE: Multidetector CT imaging of the head and cervical spine was performed following the standard protocol without intravenous contrast. Multiplanar CT image reconstructions of the  cervical spine were also generated. RADIATION DOSE REDUCTION: This exam was performed according to the departmental dose-optimization program which includes automated exposure control, adjustment of the mA and/or kV according to patient size and/or use of iterative reconstruction technique. COMPARISON:  None Available. FINDINGS: CT HEAD FINDINGS Brain: No evidence of acute infarction, hemorrhage, hydrocephalus, extra-axial collection or mass lesion/mass effect. Vascular: No hyperdense vessel or unexpected calcification. Skull: Normal. Negative for fracture or focal lesion. Sinuses/Orbits: No acute finding. Other: None. CT CERVICAL SPINE FINDINGS Alignment: Mild straightening of the normal cervical lordosis. Skull base and vertebrae: 7 cervical segments are well visualized. Vertebral body height is well maintained. Mild osteophytic changes are noted throughout the cervical spine. Mild central disc bulging is noted at C3-4 and mild left paracentral disc bulging at C5-6. No acute fracture or acute facet abnormality is noted. The odontoid is within normal limits. Soft tissues and spinal canal: Surrounding soft tissue structures are within normal limits. Upper chest: Visualized lung apices are unremarkable. Other: None IMPRESSION: CT of the head: No acute intracranial abnormality noted. CT of the cervical spine: Multilevel degenerative changes as described no acute abnormality noted. Electronically Signed   By: Oneil Devonshire M.D.   On: 07/24/2024 00:26    ____________________________________________   PROCEDURES  Procedure(s) performed:   Procedures  None  ____________________________________________   INITIAL IMPRESSION / ASSESSMENT AND PLAN / ED COURSE  Pertinent labs & imaging results that were available during my care of the patient were reviewed by me and considered in my medical decision making (see chart for details).   This patient is Presenting for Evaluation of syncope, which does require a  range of treatment options, and is a complaint that involves a high risk of morbidity and mortality.  The Differential Diagnoses includes subdural hematoma, epidural hematoma, acute concussion, traumatic subarachnoid hemorrhage, cerebral contusions, etc.   Critical Interventions-    Medications  HYDROmorphone  (DILAUDID ) injection 0.5 mg (0.5 mg Intravenous Given 07/23/24 2352)  sodium chloride  0.9 % bolus 1,000 mL (0 mLs Intravenous Stopped 07/24/24 0130)  promethazine  (PHENERGAN ) tablet 25 mg (25 mg Oral Given 07/23/24 2352)  HYDROmorphone  (DILAUDID ) injection 0.2 mg (0.2 mg Intravenous Given 07/24/24 0328)    Reassessment after intervention: pain/nausea improved.   I decided to review pertinent External Data, and in summary patient discharged on 07/21/24.   Clinical Laboratory Tests Ordered, included CMP with elevated creatinine to 1.77 above baseline.  Leukocytosis to 14.2.  Radiologic Tests Ordered, included CT head and spine. I independently interpreted the images and agree with radiology interpretation.   Cardiac Monitor Tracing which shows NSR.    Social Determinants of Health Risk patient is a non-smoker.   Consult complete with TRH. Dr. Franky. Plan for admit.   Medical Decision Making: Summary:  The patient presents emergency department for evaluation of syncope.  She has had continued nausea with known history of gastritis. Plan for labs and trauma imaging.   Reevaluation with update and discussion with patient.  On reassessment she is complaining of some left ankle pain.  Sent for x-ray which shows distal fibular fracture which is anatomically aligned.  No change in ankle mortise.  Will order cam walker boot.  Will need outpatient ortho follow up after discharge.   Patient's presentation is most consistent with acute presentation with potential threat to life or bodily function.   Disposition: admit  ____________________________________________  FINAL CLINICAL  IMPRESSION(S) / ED DIAGNOSES  Final diagnoses:  Syncope, unspecified syncope type  AKI (acute kidney injury) ([name])  Closed fracture of distal end of left fibula, unspecified fracture morphology, initial encounter     Note:  This document was prepared using Dragon voice recognition software and may include unintentional dictation errors.  Fonda Law, MD, [facility] Emergency Medicine    Symia Herdt, Fonda MATSU, MD 07/24/24 [phone]

## 2024-07-23 NOTE — ED Triage Notes (Signed)
 Pt BIB EMS from home. EMS reports pt had three falls today, most recent around 6pm. Pt says she became dizzy and next thing she knows is she was on the bathroom floor. Pt complains of neck/head discomfort, and 10/10 upper back pain, c collar present.  Unknown LOC, No thinners.  EMS VS 128/76 laying flat, 98/62 while sitting, HR 96, RR 20, 97% RA, cbg 223  EMS admin NS and 4mg  zofran 

## 2024-07-23 NOTE — ED Notes (Signed)
 Patient transported to CT

## 2024-07-24 ENCOUNTER — Emergency Department (HOSPITAL_COMMUNITY)

## 2024-07-24 ENCOUNTER — Other Ambulatory Visit: Payer: Self-pay | Admitting: Physician Assistant

## 2024-07-24 ENCOUNTER — Observation Stay (HOSPITAL_COMMUNITY)

## 2024-07-24 ENCOUNTER — Encounter (HOSPITAL_COMMUNITY): Payer: Self-pay | Admitting: Internal Medicine

## 2024-07-24 DIAGNOSIS — W1830XA Fall on same level, unspecified, initial encounter: Secondary | ICD-10-CM | POA: Diagnosis present

## 2024-07-24 DIAGNOSIS — E039 Hypothyroidism, unspecified: Secondary | ICD-10-CM | POA: Diagnosis present

## 2024-07-24 DIAGNOSIS — Y92091 Bathroom in other non-institutional residence as the place of occurrence of the external cause: Secondary | ICD-10-CM | POA: Diagnosis not present

## 2024-07-24 DIAGNOSIS — E785 Hyperlipidemia, unspecified: Secondary | ICD-10-CM | POA: Diagnosis present

## 2024-07-24 DIAGNOSIS — I428 Other cardiomyopathies: Secondary | ICD-10-CM | POA: Diagnosis present

## 2024-07-24 DIAGNOSIS — N179 Acute kidney failure, unspecified: Secondary | ICD-10-CM | POA: Diagnosis present

## 2024-07-24 DIAGNOSIS — I951 Orthostatic hypotension: Secondary | ICD-10-CM | POA: Diagnosis present

## 2024-07-24 DIAGNOSIS — G43909 Migraine, unspecified, not intractable, without status migrainosus: Secondary | ICD-10-CM | POA: Diagnosis present

## 2024-07-24 DIAGNOSIS — E1065 Type 1 diabetes mellitus with hyperglycemia: Secondary | ICD-10-CM | POA: Diagnosis present

## 2024-07-24 DIAGNOSIS — M546 Pain in thoracic spine: Secondary | ICD-10-CM | POA: Diagnosis present

## 2024-07-24 DIAGNOSIS — M545 Low back pain, unspecified: Secondary | ICD-10-CM | POA: Diagnosis present

## 2024-07-24 DIAGNOSIS — Z794 Long term (current) use of insulin: Secondary | ICD-10-CM | POA: Diagnosis not present

## 2024-07-24 DIAGNOSIS — G8929 Other chronic pain: Secondary | ICD-10-CM | POA: Diagnosis present

## 2024-07-24 DIAGNOSIS — D72829 Elevated white blood cell count, unspecified: Secondary | ICD-10-CM | POA: Diagnosis present

## 2024-07-24 DIAGNOSIS — I5030 Unspecified diastolic (congestive) heart failure: Secondary | ICD-10-CM | POA: Diagnosis present

## 2024-07-24 DIAGNOSIS — K589 Irritable bowel syndrome without diarrhea: Secondary | ICD-10-CM | POA: Diagnosis present

## 2024-07-24 DIAGNOSIS — E860 Dehydration: Secondary | ICD-10-CM | POA: Diagnosis present

## 2024-07-24 DIAGNOSIS — S82832A Other fracture of upper and lower end of left fibula, initial encounter for closed fracture: Secondary | ICD-10-CM | POA: Diagnosis present

## 2024-07-24 DIAGNOSIS — I6521 Occlusion and stenosis of right carotid artery: Secondary | ICD-10-CM | POA: Diagnosis present

## 2024-07-24 DIAGNOSIS — R55 Syncope and collapse: Secondary | ICD-10-CM | POA: Diagnosis present

## 2024-07-24 DIAGNOSIS — I2510 Atherosclerotic heart disease of native coronary artery without angina pectoris: Secondary | ICD-10-CM | POA: Diagnosis present

## 2024-07-24 DIAGNOSIS — I130 Hypertensive heart and chronic kidney disease with heart failure and stage 1 through stage 4 chronic kidney disease, or unspecified chronic kidney disease: Secondary | ICD-10-CM | POA: Diagnosis present

## 2024-07-24 DIAGNOSIS — R112 Nausea with vomiting, unspecified: Secondary | ICD-10-CM | POA: Diagnosis not present

## 2024-07-24 DIAGNOSIS — N182 Chronic kidney disease, stage 2 (mild): Secondary | ICD-10-CM | POA: Diagnosis present

## 2024-07-24 DIAGNOSIS — E1042 Type 1 diabetes mellitus with diabetic polyneuropathy: Secondary | ICD-10-CM | POA: Diagnosis present

## 2024-07-24 DIAGNOSIS — E1022 Type 1 diabetes mellitus with diabetic chronic kidney disease: Secondary | ICD-10-CM | POA: Diagnosis present

## 2024-07-24 DIAGNOSIS — S82402A Unspecified fracture of shaft of left fibula, initial encounter for closed fracture: Secondary | ICD-10-CM | POA: Insufficient documentation

## 2024-07-24 DIAGNOSIS — K2950 Unspecified chronic gastritis without bleeding: Secondary | ICD-10-CM | POA: Diagnosis present

## 2024-07-24 DIAGNOSIS — K219 Gastro-esophageal reflux disease without esophagitis: Secondary | ICD-10-CM | POA: Diagnosis present

## 2024-07-24 LAB — HEPATIC FUNCTION PANEL
ALT: 15 U/L (ref 0–44)
AST: 23 U/L (ref 15–41)
Albumin: 3.7 g/dL (ref 3.5–5.0)
Alkaline Phosphatase: 64 U/L (ref 38–126)
Bilirubin, Direct: 0.3 mg/dL — ABNORMAL HIGH (ref 0.0–0.2)
Indirect Bilirubin: 0.9 mg/dL (ref 0.3–0.9)
Total Bilirubin: 1.2 mg/dL (ref 0.0–1.2)
Total Protein: 6.7 g/dL (ref 6.5–8.1)

## 2024-07-24 LAB — CBC WITH DIFFERENTIAL/PLATELET
Abs Immature Granulocytes: 0.05 K/uL (ref 0.00–0.07)
Abs Immature Granulocytes: 0.06 K/uL (ref 0.00–0.07)
Basophils Absolute: 0 K/uL (ref 0.0–0.1)
Basophils Absolute: 0.1 K/uL (ref 0.0–0.1)
Basophils Relative: 0 %
Basophils Relative: 0 %
Eosinophils Absolute: 0.1 K/uL (ref 0.0–0.5)
Eosinophils Absolute: 0.2 K/uL (ref 0.0–0.5)
Eosinophils Relative: 1 %
Eosinophils Relative: 1 %
HCT: 43.9 % (ref 36.0–46.0)
HCT: 44.5 % (ref 36.0–46.0)
Hemoglobin: 14.7 g/dL (ref 12.0–15.0)
Hemoglobin: 14.8 g/dL (ref 12.0–15.0)
Immature Granulocytes: 0 %
Immature Granulocytes: 0 %
Lymphocytes Relative: 16 %
Lymphocytes Relative: 19 %
Lymphs Abs: 2.1 K/uL (ref 0.7–4.0)
Lymphs Abs: 2.7 K/uL (ref 0.7–4.0)
MCH: 28.4 pg (ref 26.0–34.0)
MCH: 28.7 pg (ref 26.0–34.0)
MCHC: 33.3 g/dL (ref 30.0–36.0)
MCHC: 33.5 g/dL (ref 30.0–36.0)
MCV: 85.2 fL (ref 80.0–100.0)
MCV: 85.7 fL (ref 80.0–100.0)
Monocytes Absolute: 1.1 K/uL — ABNORMAL HIGH (ref 0.1–1.0)
Monocytes Absolute: 1.2 K/uL — ABNORMAL HIGH (ref 0.1–1.0)
Monocytes Relative: 8 %
Monocytes Relative: 9 %
Neutro Abs: 10.1 K/uL — ABNORMAL HIGH (ref 1.7–7.7)
Neutro Abs: 9.9 K/uL — ABNORMAL HIGH (ref 1.7–7.7)
Neutrophils Relative %: 71 %
Neutrophils Relative %: 75 %
Platelets: 332 K/uL (ref 150–400)
Platelets: 347 K/uL (ref 150–400)
RBC: 5.12 MIL/uL — ABNORMAL HIGH (ref 3.87–5.11)
RBC: 5.22 MIL/uL — ABNORMAL HIGH (ref 3.87–5.11)
RDW: 15.9 % — ABNORMAL HIGH (ref 11.5–15.5)
RDW: 16.2 % — ABNORMAL HIGH (ref 11.5–15.5)
WBC: 13.3 K/uL — ABNORMAL HIGH (ref 4.0–10.5)
WBC: 14.2 K/uL — ABNORMAL HIGH (ref 4.0–10.5)
nRBC: 0 % (ref 0.0–0.2)
nRBC: 0 % (ref 0.0–0.2)

## 2024-07-24 LAB — COMPREHENSIVE METABOLIC PANEL WITH GFR
ALT: 13 U/L (ref 0–44)
AST: 17 U/L (ref 15–41)
Albumin: 3.9 g/dL (ref 3.5–5.0)
Alkaline Phosphatase: 55 U/L (ref 38–126)
Anion gap: 14 (ref 5–15)
BUN: 29 mg/dL — ABNORMAL HIGH (ref 8–23)
CO2: 28 mmol/L (ref 22–32)
Calcium: 9.1 mg/dL (ref 8.9–10.3)
Chloride: 94 mmol/L — ABNORMAL LOW (ref 98–111)
Creatinine, Ser: 1.77 mg/dL — ABNORMAL HIGH (ref 0.44–1.00)
GFR, Estimated: 32 mL/min — ABNORMAL LOW (ref >60)
Glucose, Bld: 200 mg/dL — ABNORMAL HIGH (ref 70–99)
Potassium: 3.6 mmol/L (ref 3.5–5.1)
Sodium: 136 mmol/L (ref 135–145)
Total Bilirubin: 0.7 mg/dL (ref 0.0–1.2)
Total Protein: 6.7 g/dL (ref 6.5–8.1)

## 2024-07-24 LAB — LIPASE, BLOOD: Lipase: 29 U/L (ref 11–51)

## 2024-07-24 LAB — URINALYSIS, ROUTINE W REFLEX MICROSCOPIC
Bilirubin Urine: NEGATIVE
Glucose, UA: 50 mg/dL — AB
Hgb urine dipstick: NEGATIVE
Ketones, ur: 5 mg/dL — AB
Leukocytes,Ua: NEGATIVE
Nitrite: NEGATIVE
Protein, ur: 30 mg/dL — AB
Specific Gravity, Urine: 1.011 (ref 1.005–1.030)
pH: 8 (ref 5.0–8.0)

## 2024-07-24 LAB — BASIC METABOLIC PANEL WITH GFR
Anion gap: 13 (ref 5–15)
BUN: 23 mg/dL (ref 8–23)
CO2: 28 mmol/L (ref 22–32)
Calcium: 8.8 mg/dL — ABNORMAL LOW (ref 8.9–10.3)
Chloride: 97 mmol/L — ABNORMAL LOW (ref 98–111)
Creatinine, Ser: 0.96 mg/dL (ref 0.44–1.00)
GFR, Estimated: >60 mL/min (ref >60)
Glucose, Bld: 180 mg/dL — ABNORMAL HIGH (ref 70–99)
Potassium: 4 mmol/L (ref 3.5–5.1)
Sodium: 138 mmol/L (ref 135–145)

## 2024-07-24 LAB — CORTISOL: Cortisol, Plasma: 6.7 ug/dL

## 2024-07-24 LAB — GLUCOSE, CAPILLARY
Glucose-Capillary: 143 mg/dL — ABNORMAL HIGH (ref 70–99)
Glucose-Capillary: 150 mg/dL — ABNORMAL HIGH (ref 70–99)
Glucose-Capillary: 80 mg/dL (ref 70–99)
Glucose-Capillary: 99 mg/dL (ref 70–99)

## 2024-07-24 LAB — CBG MONITORING, ED: Glucose-Capillary: 171 mg/dL — ABNORMAL HIGH (ref 70–99)

## 2024-07-24 LAB — TROPONIN I (HIGH SENSITIVITY): Troponin I (High Sensitivity): 3 ng/L (ref <18)

## 2024-07-24 LAB — TSH: TSH: 29.976 u[IU]/mL — ABNORMAL HIGH (ref 0.350–4.500)

## 2024-07-24 MED ORDER — ONDANSETRON HCL 4 MG/2ML IJ SOLN
4.0000 mg | Freq: Four times a day (QID) | INTRAMUSCULAR | Status: DC | PRN
  Administered 2024-07-24 – 2024-07-27 (×4): 4 mg via INTRAVENOUS
  Filled 2024-07-24 (×4): qty 2

## 2024-07-24 MED ORDER — GABAPENTIN 100 MG PO CAPS
100.0000 mg | ORAL_CAPSULE | Freq: Every day | ORAL | Status: DC
  Administered 2024-07-24 – 2024-07-26 (×3): 100 mg via ORAL
  Filled 2024-07-24 (×3): qty 1

## 2024-07-24 MED ORDER — PROCHLORPERAZINE EDISYLATE 10 MG/2ML IJ SOLN
5.0000 mg | Freq: Once | INTRAMUSCULAR | Status: AC
  Administered 2024-07-24: 5 mg via INTRAVENOUS
  Filled 2024-07-24: qty 2

## 2024-07-24 MED ORDER — INSULIN GLARGINE-YFGN 100 UNIT/ML ~~LOC~~ SOLN
10.0000 [IU] | Freq: Every day | SUBCUTANEOUS | Status: DC
  Administered 2024-07-24 – 2024-07-27 (×3): 10 [IU] via SUBCUTANEOUS
  Filled 2024-07-24 (×4): qty 0.1

## 2024-07-24 MED ORDER — PANTOPRAZOLE SODIUM 40 MG IV SOLR
40.0000 mg | INTRAVENOUS | Status: DC
  Administered 2024-07-24 – 2024-07-25 (×2): 40 mg via INTRAVENOUS
  Filled 2024-07-24 (×2): qty 10

## 2024-07-24 MED ORDER — INSULIN ASPART 100 UNIT/ML IJ SOLN
0.0000 [IU] | INTRAMUSCULAR | Status: DC
  Administered 2024-07-24: 2 [IU] via SUBCUTANEOUS
  Administered 2024-07-24 (×2): 1 [IU] via SUBCUTANEOUS
  Administered 2024-07-25 (×2): 2 [IU] via SUBCUTANEOUS
  Administered 2024-07-25 – 2024-07-26 (×3): 1 [IU] via SUBCUTANEOUS
  Administered 2024-07-26: 2 [IU] via SUBCUTANEOUS
  Administered 2024-07-27: 1 [IU] via SUBCUTANEOUS
  Administered 2024-07-27: 2 [IU] via SUBCUTANEOUS
  Administered 2024-07-27: 1 [IU] via SUBCUTANEOUS
  Administered 2024-07-27: 2 [IU] via SUBCUTANEOUS

## 2024-07-24 MED ORDER — LEVOTHYROXINE SODIUM 75 MCG PO TABS
150.0000 ug | ORAL_TABLET | Freq: Every day | ORAL | Status: DC
  Administered 2024-07-25 – 2024-07-27 (×3): 150 ug via ORAL
  Filled 2024-07-24 (×4): qty 2

## 2024-07-24 MED ORDER — LACTATED RINGERS IV BOLUS
500.0000 mL | Freq: Once | INTRAVENOUS | Status: AC
  Administered 2024-07-24: 500 mL via INTRAVENOUS

## 2024-07-24 MED ORDER — LACTATED RINGERS IV SOLN: INTRAVENOUS | Status: AC

## 2024-07-24 MED ORDER — ENOXAPARIN SODIUM 40 MG/0.4ML IJ SOSY
40.0000 mg | PREFILLED_SYRINGE | INTRAMUSCULAR | Status: DC
  Administered 2024-07-25 – 2024-07-27 (×3): 40 mg via SUBCUTANEOUS
  Filled 2024-07-24 (×3): qty 0.4

## 2024-07-24 MED ORDER — ASPIRIN 81 MG PO TBEC
81.0000 mg | DELAYED_RELEASE_TABLET | Freq: Every day | ORAL | Status: DC
  Administered 2024-07-24 – 2024-07-27 (×4): 81 mg via ORAL
  Filled 2024-07-24 (×4): qty 1

## 2024-07-24 MED ORDER — HYDROMORPHONE HCL 1 MG/ML IJ SOLN
0.2000 mg | Freq: Once | INTRAMUSCULAR | Status: AC
  Administered 2024-07-24: 0.2 mg via INTRAVENOUS
  Filled 2024-07-24: qty 1

## 2024-07-24 MED ORDER — ENOXAPARIN SODIUM 30 MG/0.3ML IJ SOSY
30.0000 mg | PREFILLED_SYRINGE | INTRAMUSCULAR | Status: DC
  Administered 2024-07-24: 30 mg via SUBCUTANEOUS
  Filled 2024-07-24: qty 0.3

## 2024-07-24 MED ORDER — MIDODRINE HCL 5 MG PO TABS: 2.5000 mg | ORAL_TABLET | Freq: Three times a day (TID) | ORAL | Status: DC

## 2024-07-24 MED ORDER — MIDODRINE HCL 5 MG PO TABS
2.5000 mg | ORAL_TABLET | Freq: Three times a day (TID) | ORAL | Status: DC
  Administered 2024-07-24 – 2024-07-26 (×7): 2.5 mg via ORAL
  Filled 2024-07-24 (×7): qty 1

## 2024-07-24 NOTE — ED Notes (Signed)
 Pt experiencing sudden on set of chest pressure/pain. Has ongoing nausea. Pain 9/10. Denies any radiation. Pressure is in the center of her chest. No vomiting at this time.

## 2024-07-24 NOTE — Progress Notes (Signed)
 New Admission Note:   Arrival Method: w/c Mental Orientation: aa+ox4 Telemetry: box 9 Assessment: Completed Skin: c/d/i IV: LR  Pain: denies Tubes: n/a Safety Measures: Safety Fall Prevention Plan has been given, discussed and signed Admission: Completed [facility] Orientation: Patient has been orientated to the room, unit and staff.  Family: not present  Orders have been reviewed and implemented. Will continue to monitor the patient. Call light has been placed within reach and bed alarm has been activated.   Doyal Sias, RN

## 2024-07-24 NOTE — TOC Initial Note (Signed)
 Transition of Care [facility]) - Initial/Assessment Note    Patient Details  Name: [name] [name] MRN: [id number] Date of Birth: 13-Mar-1961  [facility] CM/SW Contact:    Lendia Dais, LCSWA Phone Number: 07/24/2024, 4:15 PM  Clinical Narrative:  Pt is from home with husband Norleen and is independent with daily activities. Pt drives and has a rollator, Dexcom G7, and an insulin  pump.   Pt takes meds as prescribed and has no SDOH concerns. Pt does not receive income but the husband receives income. Pt gave CSW permission for Norleen to be contacted for updates of care.  ICM will continue to follow for dc needs.             Expected Discharge Plan: Home/Self Care Barriers to Discharge: Continued Medical Work up   Patient Goals and CMS Choice Patient states their goals for this hospitalization and ongoing recovery are:: Return home   Choice offered to / list presented to : NA      Expected Discharge Plan and Services In-house Referral: Clinical Social Work     Living arrangements for the past 2 months: Single Family Home                                      Prior Living Arrangements/Services Living arrangements for the past 2 months: Single Family Home Lives with:: Spouse Patient language and need for interpreter reviewed:: Yes Do you feel safe going back to the place where you live?: Yes      Need for Family Participation in Patient Care: Yes (Comment) Care giver support system in place?: Yes (comment) Current home services: DME Criminal Activity/Legal Involvement Pertinent to Current Situation/Hospitalization: No - Comment as needed  Activities of Daily Living   ADL Screening (condition at time of admission) Independently performs ADLs?: Yes (appropriate for developmental age) Is the patient deaf or have difficulty hearing?: No Does the patient have difficulty seeing, even when wearing glasses/contacts?: No Does the patient have difficulty  concentrating, remembering, or making decisions?: No  Permission Sought/Granted Permission sought to share information with : Family Supports Permission granted to share information with : Yes, Verbal Permission Granted  Share Information with NAME: Mylo, Driskill (Spouse)  [phone]           Emotional Assessment Appearance:: Appears stated age Attitude/Demeanor/Rapport: Engaged Affect (typically observed): Pleasant, Appropriate Orientation: : Oriented to Self, Oriented to Place, Oriented to  Time, Oriented to Situation Alcohol / Substance Use: Not Applicable Psych Involvement: No (comment)  Admission diagnosis:  Syncope [R55] AKI (acute kidney injury) ([name]) [N17.9] Syncope, unspecified syncope type [R55] Closed fracture of distal end of left fibula, unspecified fracture morphology, initial encounter [D17.167J] Patient Active Problem List   Diagnosis Date Noted   Syncope 07/24/2024   Closed left fibular fracture 07/24/2024   Abdominal pain 07/18/2024   Pyelonephritis 05/28/2024   Insulin  pump in place 04/28/2024   Acute pancreatitis 01/21/2024   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   Polycythemia 01/05/2024   Diabetic ketoacidosis associated with type 1 diabetes mellitus ([name]) 12/07/2023   Depression 11/12/2023   Intractable abdominal pain 10/26/2023   Gall bladder polyp 10/25/2023   Peptic ulcer disease 10/25/2023   Bile duct abnormality 09/24/2023   Gastritis and gastroduodenitis 09/24/2023   Multiple duodenal ulcers 09/24/2023   Esophagitis 09/23/2023   Abdominal pain, bilateral upper quadrant 09/23/2023   Intractable nausea 09/22/2023   Hyperglycemia 09/02/2023  DKA (diabetic ketoacidosis) ([name]) 08/01/2023   Intractable vomiting with nausea 06/14/2023   non obstructive CAD (coronary artery disease) 05/14/2023   Urinary retention 05/14/2023   Diabetes mellitus type 1 ([name]) 05/14/2023   UTI (urinary tract infection) 05/14/2023   Abnormal CT scan, esophagus  02/05/2023   ARF (acute renal failure) ([name]) 02/03/2023   Gastroparesis 01/07/2023   History of hypotension 10/18/2022   Metabolic alkalosis 10/18/2022   Dyslipidemia 10/18/2022   Chronic pain of both knees 08/20/2022   Intractable nausea and vomiting 03/27/2022   Marijuana abuse 03/27/2022   Bladder wall thickening 03/09/2021   Chronic orthostatic hypotension 03/06/2021   Gastric erosion    Urinary frequency 02/10/2020   Chronic diastolic HF (heart failure) ([name]) 10/12/2019   Duodenal ulcer without hemorrhage or perforation    Acute esophagitis    Duodenitis    Constipation    Nausea & vomiting 03/25/2019   MDD (major depressive disorder), recurrent episode, moderate ([name]) 07/01/2018   Dehydration 05/26/2018   Colitis 03/07/2018   Chronic chest pain    Noncompliance 01/14/2018   Migraines 12/11/2017   Phrygian cap 06/20/2017   Leukocytosis    Hypokalemia 02/15/2017   NSTEMI (non-ST elevated myocardial infarction) ([name]) 02/11/2017   Paroxysmal A-fib ([name]) 02/01/2015   Kidney disease, chronic, stage 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) 10/29/2013   Nausea 10/28/2013   Vitamin D  deficiency 05/20/2012   Osteoporosis 02/01/2011   B12 deficiency 04/04/2010   GERD 04/08/2009   Hypothyroidism 04/29/2008   Hyperlipidemia 04/29/2008   Irritable bowel syndrome 04/29/2008   PCP:  Arlyss Eugene BRAVO, NP Pharmacy:   [facility] [id number] [place], [place] - [address] [place] [address] Phone: [phone] Fax: [phone]     Social Drivers of Health (SDOH) Social History: SDOH Screenings   Food Insecurity: No Food Insecurity (07/24/2024)  Housing: Low Risk  (07/24/2024)  Transportation Needs: No Transportation Needs (07/24/2024)  Utilities: Not At Risk (07/24/2024)  Depression (PHQ2-9): High Risk (01/24/2022)  Financial Resource Strain: Low Risk  (04/25/2024)   Received from [facility]  Physical Activity: Unknown (04/25/2024)   Received from [facility]  Social Connections: Moderately Isolated (07/18/2024)  Stress: No Stress Concern Present (04/25/2024)   Received from [facility]  Tobacco Use: Medium Risk (07/24/2024)   SDOH Interventions:     Readmission Risk Interventions    05/31/2024   11:30 AM 01/23/2024    1:16 PM 01/14/2024    1:35 PM  Readmission Risk Prevention Plan  Transportation Screening Complete Complete Complete  Medication Review Oceanographer) Complete Complete Complete  PCP or Specialist appointment within 3-5 days of discharge Complete Complete Complete  HRI or Home Care Consult Complete Complete Complete  SW Recovery Care/Counseling Consult Complete Complete Complete  Palliative Care Screening Not Applicable Not Applicable Not Applicable  Skilled Nursing Facility Not Applicable Not Applicable Not Applicable

## 2024-07-24 NOTE — H&P (Signed)
 History and Physical    [name] [name] FMW:[id number] DOB: 10-22-1961 DOA: 07/23/2024  Patient coming from: Home.  Chief Complaint: Loss of consciousness.  HPI: [name] [name] is a 63 y.o. female with history of diabetes mellitus type 1 on insulin  pump has recently ran out of supplies for insulin  recently admitted to the hospital at [facility] for nausea vomiting cause was not clear has had extensive workup and multiple admissions for same has had cholecystectomy and ERCP with sphincterotomy in March 2025 at [facility] and gastric emptying study in April 2024 at [facility] was unremarkable patient has had cardiac cath in February 2024 which showed normal LV with trivial nonobstructive CAD with prior history of nonischemic cardiomyopathy with improved EF presents to the ER after patient has had multiple episodes of syncope.  Patient states she has had at least 3 episode of syncope.  Patient states after she left home 2 days ago after being discharged she has been having nausea vomiting and passed out 3 times.  Most likely the episodes happened when she was throwing up.  She has been hurting in her left lower extremity.  Denies chest pain or shortness of breath.  During recent hospital stay patient also was diagnosed with UTI with E. coli.  ED Course: In the ER labs show acute renal failure with creatinine of 1.7 blood glucose of 200 anion gap of 14.  WBC was 14.2 CT scan of the head C-spine L-spine T-spine was unremarkable.  X-ray showed left distal tubular fracture with minimal displacement.  EKG shows normal sinus rhythm with QTc of 425 ms.  Patient was placed on IV fluids admitted for further management.  Review of Systems: As per HPI, rest all negative.   Past Medical History:  Diagnosis Date   Allergy    seasonal   Anxiety    B12 deficiency    Chest pain 04/05/2017   CHF (congestive heart failure) ([name])    Diabetes mellitus type 1, uncontrolled    dr. 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) 10/29/2013   Leg cramping    @ night (09/18/2013)   Migraine    once q couple months (11/26/2018)   Osteoporosis    Peripheral neuropathy    Umbilical hernia    unrepaired (09/18/2013)    Past Surgical History:  Procedure Laterality Date   BIOPSY  06/03/2019   Procedure: BIOPSY;  Surgeon: Charlanne Groom, MD;  Location: [facility];  Service: Endoscopy;;   BIOPSY  08/17/2020   Procedure: BIOPSY;  Surgeon: Eda Iha, MD;  Location: [facility];  Service: Gastroenterology;;   BIOPSY  02/05/2023   Procedure: BIOPSY;  Surgeon: Aneita Gwendlyn DASEN, MD;  Location: [facility];  Service: Gastroenterology;;   BIOPSY  09/24/2023   Procedure: BIOPSY;  Surgeon: Wilhelmenia Aloha Raddle., MD;  Location: [facility];  Service: Gastroenterology;;   CESAREAN SECTION  1989   ENTEROSCOPY N/A 06/03/2019   Procedure: ENTEROSCOPY;  Surgeon: Charlanne Groom, MD;  Location: [facility];  Service: Endoscopy;  Laterality: N/A;   ESOPHAGOGASTRODUODENOSCOPY (EGD) WITH PROPOFOL  N/A 08/17/2020   Procedure: ESOPHAGOGASTRODUODENOSCOPY (EGD) WITH PROPOFOL ;  Surgeon: Eda Iha, MD;  Location: [facility];  Service: Gastroenterology;  Laterality: N/A;  ESOPHAGOGASTRODUODENOSCOPY (EGD) WITH PROPOFOL  N/A 02/05/2023   Procedure: ESOPHAGOGASTRODUODENOSCOPY (EGD) WITH PROPOFOL ;  Surgeon: Aneita Gwendlyn DASEN, MD;  Location: [facility];  Service: Gastroenterology;  Laterality: N/A;   ESOPHAGOGASTRODUODENOSCOPY (EGD) WITH PROPOFOL  N/A 09/24/2023   Procedure: ESOPHAGOGASTRODUODENOSCOPY (EGD) WITH PROPOFOL ;  Surgeon: Wilhelmenia Aloha Raddle., MD;  Location: [facility];  Service: Gastroenterology;  Laterality: N/A;   EUS N/A  09/24/2023   Procedure: UPPER ENDOSCOPIC ULTRASOUND (EUS) LINEAR;  Surgeon: Wilhelmenia Aloha Raddle., MD;  Location: [facility];  Service: Gastroenterology;  Laterality: N/A;   LAPAROSCOPIC ASSISTED VAGINAL HYSTERECTOMY  09/23/2012   Procedure: LAPAROSCOPIC ASSISTED VAGINAL HYSTERECTOMY;  Surgeon: Duwaine Blumenthal, DO;  Location: [facility];  Service: Gynecology;  Laterality: N/A;  pull Dr Jacqulyn instrument   LEFT HEART CATH AND CORONARY ANGIOGRAPHY N/A 02/11/2017   Procedure: Left Heart Cath and Coronary Angiography;  Surgeon: Dorn JINNY Lesches, MD;  Location: [facility] CV [facility];  Service: Cardiovascular;  Laterality: N/A;   TUBAL LIGATION Bilateral 1992     reports that she quit smoking about 33 years ago. Her smoking use included cigarettes. She started smoking about 43 years ago. She has a 1.2 pack-year smoking history. She has never used smokeless tobacco. She reports that she does not currently use drugs after having used the following drugs: Marijuana. She reports that she does not drink alcohol.  Allergies  Allergen Reactions   Codeine Hives and Anxiety   Ciprofloxacin  Diarrhea, Nausea Only and Other (See Comments)    Caused extreme nausea   Flagyl  [Metronidazole ] Nausea Only and Other (See Comments)    Can tolerate ONLY VIA IV (otherwise, SEVERE NAUSEA)    Family History  Problem Relation Age of Onset   Heart failure Mother 14       Her heart skipped a beat and stopped - per pt   Heart failure Brother    Diabetes Brother    Diabetes Maternal Grandmother    Alzheimer's disease Maternal Grandmother    Coronary artery disease Maternal Grandmother    Heart attack Maternal Grandfather    Diabetes Other    Heart disease Other    Colon polyps Neg Hx    Esophageal cancer Neg Hx    Liver cancer Neg Hx    Pancreatic cancer Neg Hx    Stomach cancer Neg Hx    Crohn's disease Neg Hx    Rectal cancer Neg Hx    Colon cancer Neg Hx     Prior to Admission medications   Medication Sig Start  Date End Date Taking? Authorizing Provider  aspirin  EC 81 MG tablet Take 81 mg by mouth daily. Swallow whole.   Yes [provider]  cefadroxil  (DURICEF) 500 MG capsule Take 1 capsule (500 mg total) by mouth 2 (two) times daily for 5 days. 07/21/24 07/26/24 Yes Dahal, Chapman, MD  Continuous Glucose Sensor (DEXCOM G7 SENSOR) MISC Inject 1 each into the skin See admin instructions. Place 1 new sensor into the skin every 10 days 01/30/24  Yes [provider]  gabapentin  (NEURONTIN ) 100 MG capsule Take 100 mg by mouth at bedtime.   Yes [provider]  Glucagon, rDNA, (GLUCAGON EMERGENCY) 1 MG KIT Inject 1 mg into the skin See admin instructions. Inject one mg into the skin see administration instructions. Follow package directions for low blood sugar. 06/18/23  Yes [provider]  Insulin  Disposable Pump (OMNIPOD 5 G7 PODS, GEN 5,) MISC Inject 1 Device into the skin every 3 (three) days.   Yes [provider]  levothyroxine  (SYNTHROID ) 150 MCG tablet Take 1 tablet (150 mcg total) by mouth daily before breakfast. 06/01/24  Yes [name], [name], MD  NOVOLOG  FLEXPEN 100 UNIT/ML FlexPen Inject 10 Units into the skin 3 (three) times daily with meals. Plus sliding scale TID Patient taking differently: Inject 10 Units into the skin See admin instructions. Inject 10 units into the skin three times a day with meals, PLUS sliding scale- ONLY WHEN NOT USING THE OMNIPOD 12/08/23  Yes [name], MD  omega-3 acid ethyl esters (LOVAZA ) 1 g capsule Take 2 capsules (2 g total) by mouth 2 (two) times daily. Patient taking differently: Take 2 g by mouth daily as needed (for circulation). 09/13/23  Yes Will Almarie MATSU, MD  ondansetron  (ZOFRAN ) 4 MG tablet Take 2 tablets (8 mg total) by mouth every 8 (eight) hours as needed for nausea or vomiting. 05/31/24  Yes [name], [name], MD  promethazine  (PHENERGAN ) 25 MG suppository Place 25 mg rectally every 6 (six) hours as needed  for nausea or vomiting.   Yes [provider]  promethazine  (PHENERGAN ) 25 MG tablet Take 1 tablet (25 mg total) by mouth every 6 (six) hours as needed for nausea or vomiting (to take instead of rectal suppository). 06/25/24  Yes Dreama Longs, MD  TOUJEO  SOLOSTAR 300 UNIT/ML Solostar Pen Inject 25 Units into the skin at bedtime. Patient taking differently: Inject 25 Units into the skin See admin instructions. Inject 25 units into the skin at bedtime ONLY WHEN NOT USING THE OMNIPOD 12/08/23  Yes [name], MD  acetaminophen  (TYLENOL ) 650 MG suppository Place 1 suppository (650 mg total) rectally every 6 (six) hours as needed for mild pain (pain score 1-3) or fever (or Fever >/= 101). Patient not taking: Reported on 07/24/2024 06/06/24   Will Almarie MATSU, MD  azelastine  (ASTELIN ) 0.1 % nasal spray Place 1 spray into both nostrils 2 (two) times daily as needed for rhinitis or allergies. Patient not taking: Reported on 07/24/2024 11/04/23   [provider]  BAQSIMI ONE PACK 3 MG/DOSE POWD Place 3 mg into the nose as needed (severe low blood sugar). 01/30/24   [provider]  HYDROcodone -acetaminophen  (NORCO/VICODIN) 5-325 MG tablet Take 1 tablet by mouth every 6 (six) hours as needed for up to 5 days for moderate pain (pain score 4-6). Patient not taking: Reported on 07/24/2024 07/21/24 07/26/24  Arlice Reichert, MD  pantoprazole  (PROTONIX ) 40 MG tablet Take 1 tablet (40 mg total) by mouth 2 (two) times daily. Take 30 minutes before breakfast and dinner Patient not taking: Reported on 07/24/2024 01/16/24   [name], [name], NP  tiZANidine (ZANAFLEX) 4 MG tablet Take 4 mg by mouth every 8 (eight) hours as needed for muscle spasms. Patient not taking: Reported on 07/24/2024 05/27/24 05/27/25  [provider]    Physical Exam: Constitutional: Moderately built and nourished. Vitals:   07/23/24 2309 07/24/24 0333 07/24/24 0500 07/24/24 0600  BP:   128/77  139/84  Pulse:    94  Resp:   11 15  Temp:  98.6 F (37 C)  98.6 F (37 C)  TempSrc:  Oral  Oral  SpO2:   100% 97%  Weight: 54 kg     Height: 4' 11 (1.499 m)      Eyes: Anicteric no pallor. ENMT: No discharge from the ears eyes nose or mouth. Neck: No mass felt.  No neck rigidity. Respiratory: No rhonchi or crepitations. Cardiovascular: S1-S2 heard. Abdomen: Soft nontender bowel  sound present. Musculoskeletal: Pain in the left ankle. Skin: No rash. Neurologic: Alert awake oriented time place and person.  Moves all extremities. Psychiatric: Appears normal.  Normal affect.   Labs on Admission: I have personally reviewed following labs and imaging studies  CBC: Recent Labs  Lab 07/18/24 0429 07/19/24 0411 07/20/24 0420 07/24/24 0037  WBC 9.3 7.5 7.8 14.2*  NEUTROABS 6.6  --  3.7 10.1*  HGB 15.9* 12.8 14.0 14.7  HCT 50.9* 39.9 42.9 43.9  MCV 88.2 87.5 87.2 85.7  PLT 373 294 321 332   Basic Metabolic Panel: Recent Labs  Lab 07/18/24 0533 07/19/24 0411 07/20/24 0420 07/24/24 0037  NA 139 136 137 136  K 3.8 3.8 4.1 3.6  CL 101 104 104 94*  CO2 26 21* 22 28  GLUCOSE 177* 92 98 200*  BUN 20 13 9  29*  CREATININE 0.65 0.84 0.70 1.77*  CALCIUM  8.7* 8.2* 8.9 9.1  MG 2.6*  --   --   --   PHOS 3.5  --   --   --    GFR: Estimated Creatinine Clearance: 25 mL/min (A) (by C-G formula based on SCr of 1.77 mg/dL (H)). Liver Function Tests: Recent Labs  Lab 07/18/24 0533 07/24/24 0037  AST 17 17  ALT 15 13  ALKPHOS 72 55  BILITOT 1.0 0.7  PROT 7.5 6.7  ALBUMIN 3.8 3.9   Recent Labs  Lab 07/18/24 0533 07/24/24 0037  LIPASE 26 29   No results for input(s): AMMONIA in the last 168 hours. Coagulation Profile: No results for input(s): INR, PROTIME in the last 168 hours. Cardiac Enzymes: No results for input(s): CKTOTAL, CKMB, CKMBINDEX, TROPONINI in the last 168 hours. BNP (last 3 results) No results for input(s): PROBNP in the last 8760  hours. HbA1C: No results for input(s): HGBA1C in the last 72 hours. CBG: Recent Labs  Lab 07/20/24 1155 07/20/24 1613 07/20/24 2033 07/21/24 0720 07/21/24 1130  GLUCAP 154* 98 143* 108* 150*   Lipid Profile: No results for input(s): CHOL, HDL, LDLCALC, TRIG, CHOLHDL, LDLDIRECT in the last 72 hours. Thyroid  Function Tests: No results for input(s): TSH, T4TOTAL, FREET4, T3FREE, THYROIDAB in the last 72 hours. Anemia Panel: No results for input(s): VITAMINB12, FOLATE, FERRITIN, TIBC, IRON, RETICCTPCT in the last 72 hours. Urine analysis:    Component Value Date/Time   COLORURINE YELLOW 07/18/2024 0630   APPEARANCEUR CLOUDY (A) 07/18/2024 0630   LABSPEC 1.017 07/18/2024 0630   PHURINE 7.0 07/18/2024 0630   GLUCOSEU NEGATIVE 07/18/2024 0630   HGBUR NEGATIVE 07/18/2024 0630   BILIRUBINUR NEGATIVE 07/18/2024 0630   BILIRUBINUR negative 03/09/2021 1123   KETONESUR NEGATIVE 07/18/2024 0630   PROTEINUR NEGATIVE 07/18/2024 0630   UROBILINOGEN 0.2 03/09/2021 1123   UROBILINOGEN 0.2 10/28/2013 1723   NITRITE NEGATIVE 07/18/2024 0630   LEUKOCYTESUR LARGE (A) 07/18/2024 0630   Sepsis Labs: @LABRCNTIP (procalcitonin:4,lacticidven:4) ) Recent Results (from the past 240 hours)  Urine Culture     Status: Abnormal   Collection Time: 07/18/24  6:30 AM   Specimen: Urine, Random  Result Value Ref Range Status   Specimen Description   Final    URINE, RANDOM Performed at [facility], [address] [place], [place] [address]    Special Requests   Final    NONE Performed at [facility], [address], [place], [place] [address]    Culture >=100,000 COLONIES/mL ESCHERICHIA COLI (A)  Final   Report Status 07/20/2024 FINAL  Final   Organism  ID, Bacteria ESCHERICHIA COLI (A)  Final      Susceptibility   Escherichia coli - MIC*    AMPICILLIN  16 INTERMEDIATE Intermediate     CEFAZOLIN  <=4 SENSITIVE Sensitive      CEFEPIME  <=0.12 SENSITIVE Sensitive     CEFTRIAXONE  <=0.25 SENSITIVE Sensitive     CIPROFLOXACIN  >=4 RESISTANT Resistant     GENTAMICIN <=1 SENSITIVE Sensitive     IMIPENEM <=0.25 SENSITIVE Sensitive     NITROFURANTOIN  <=16 SENSITIVE Sensitive     TRIMETH /SULFA  <=20 SENSITIVE Sensitive     AMPICILLIN /SULBACTAM 8 SENSITIVE Sensitive     PIP/TAZO 8 SENSITIVE Sensitive ug/mL    * >=100,000 COLONIES/mL ESCHERICHIA COLI     Radiological Exams on Admission: DG Ankle 2 Views Left Result Date: 07/24/2024 CLINICAL DATA:  Fall EXAM: LEFT ANKLE - 2 VIEW COMPARISON:  None Available. FINDINGS: Acute, oblique, minimally displaced, anatomically aligned fracture of the distal left fibular metaphyseal region extending to the level of the tibial plafond is noted. Normal overall alignment. Ankle mortise appears preserved. No ankle effusion. Extensive soft tissue swelling superficial to the lateral malleolus. Vascular calcifications noted. IMPRESSION: 1. Acute, minimally displaced, anatomically aligned fracture of the distal left fibular metaphyseal region. Electronically Signed   By: Dorethia Molt M.D.   On: 07/24/2024 03:15   DG Knee 1-2 Views Left Result Date: 07/24/2024 CLINICAL DATA:  Fall EXAM: LEFT KNEE - 1-2 VIEW COMPARISON:  None Available. FINDINGS: No evidence of fracture, dislocation, or joint effusion. No evidence of arthropathy or other focal bone abnormality. Vascular calcifications noted. IMPRESSION: Negative. Electronically Signed   By: Dorethia Molt M.D.   On: 07/24/2024 03:14   DG Pelvis 1-2 Views Result Date: 07/24/2024 CLINICAL DATA:  Fall EXAM: PELVIS - 1-2 VIEW COMPARISON:  None Available. FINDINGS: There is no evidence of pelvic fracture or diastasis. No pelvic bone lesions are seen. IMPRESSION: Negative. Electronically Signed   By: Dorethia Molt M.D.   On: 07/24/2024 03:14   CT Thoracic Spine Wo Contrast Result Date: 07/24/2024 CLINICAL DATA:  Multiple falls with back pain, initial  encounter EXAM: CT THORACIC AND LUMBAR SPINE WITHOUT CONTRAST TECHNIQUE: Multidetector CT imaging of the thoracic and lumbar spine was performed without contrast. Multiplanar CT image reconstructions were also generated. RADIATION DOSE REDUCTION: This exam was performed according to the departmental dose-optimization program which includes automated exposure control, adjustment of the mA and/or kV according to patient size and/or use of iterative reconstruction technique. COMPARISON:  07/18/2024 FINDINGS: CT THORACIC SPINE FINDINGS Alignment: Normal. Vertebrae: 12 thoracic vertebra are noted. Vertebral body height is well maintained. No compression deformity is seen. Minimal osteophytic changes are seen. No acute fracture is noted. Paraspinal and other soft tissues: No significant soft tissue abnormality is noted. The visualize ribcage is within normal limits. Disc levels: No specific disc pathology is noted. CT LUMBAR SPINE FINDINGS Segmentation: 5 lumbar type vertebrae. Alignment: Alignment is within normal limits. Vertebrae: Vertebral body height is well maintained. No compression deformity is seen. Very mild osteophytic changes are seen. No acute fracture or acute facet abnormality is noted. Paraspinal and other soft tissues: Surrounding soft tissue structures show no acute abnormality. Disc levels: No specific disc pathology is noted. IMPRESSION: CT THORACIC SPINE IMPRESSION Mild degenerative changes without acute abnormality. CT LUMBAR SPINE IMPRESSION Mild degenerative change without acute abnormality. Electronically Signed   By: Oneil Devonshire M.D.   On: 07/24/2024 00:34   CT Lumbar Spine Wo Contrast Result Date: 07/24/2024 CLINICAL DATA:  Multiple falls with back  pain, initial encounter EXAM: CT THORACIC AND LUMBAR SPINE WITHOUT CONTRAST TECHNIQUE: Multidetector CT imaging of the thoracic and lumbar spine was performed without contrast. Multiplanar CT image reconstructions were also generated. RADIATION  DOSE REDUCTION: This exam was performed according to the departmental dose-optimization program which includes automated exposure control, adjustment of the mA and/or kV according to patient size and/or use of iterative reconstruction technique. COMPARISON:  07/18/2024 FINDINGS: CT THORACIC SPINE FINDINGS Alignment: Normal. Vertebrae: 12 thoracic vertebra are noted. Vertebral body height is well maintained. No compression deformity is seen. Minimal osteophytic changes are seen. No acute fracture is noted. Paraspinal and other soft tissues: No significant soft tissue abnormality is noted. The visualize ribcage is within normal limits. Disc levels: No specific disc pathology is noted. CT LUMBAR SPINE FINDINGS Segmentation: 5 lumbar type vertebrae. Alignment: Alignment is within normal limits. Vertebrae: Vertebral body height is well maintained. No compression deformity is seen. Very mild osteophytic changes are seen. No acute fracture or acute facet abnormality is noted. Paraspinal and other soft tissues: Surrounding soft tissue structures show no acute abnormality. Disc levels: No specific disc pathology is noted. IMPRESSION: CT THORACIC SPINE IMPRESSION Mild degenerative changes without acute abnormality. CT LUMBAR SPINE IMPRESSION Mild degenerative change without acute abnormality. Electronically Signed   By: Oneil Devonshire M.D.   On: 07/24/2024 00:34   CT Head Wo Contrast Result Date: 07/24/2024 CLINICAL DATA:  History of recent falls with dizziness and neck pain, initial encounter EXAM: CT HEAD WITHOUT CONTRAST CT CERVICAL SPINE WITHOUT CONTRAST TECHNIQUE: Multidetector CT imaging of the head and cervical spine was performed following the standard protocol without intravenous contrast. Multiplanar CT image reconstructions of the cervical spine were also generated. RADIATION DOSE REDUCTION: This exam was performed according to the departmental dose-optimization program which includes automated exposure control,  adjustment of the mA and/or kV according to patient size and/or use of iterative reconstruction technique. COMPARISON:  None Available. FINDINGS: CT HEAD FINDINGS Brain: No evidence of acute infarction, hemorrhage, hydrocephalus, extra-axial collection or mass lesion/mass effect. Vascular: No hyperdense vessel or unexpected calcification. Skull: Normal. Negative for fracture or focal lesion. Sinuses/Orbits: No acute finding. Other: None. CT CERVICAL SPINE FINDINGS Alignment: Mild straightening of the normal cervical lordosis. Skull base and vertebrae: 7 cervical segments are well visualized. Vertebral body height is well maintained. Mild osteophytic changes are noted throughout the cervical spine. Mild central disc bulging is noted at C3-4 and mild left paracentral disc bulging at C5-6. No acute fracture or acute facet abnormality is noted. The odontoid is within normal limits. Soft tissues and spinal canal: Surrounding soft tissue structures are within normal limits. Upper chest: Visualized lung apices are unremarkable. Other: None IMPRESSION: CT of the head: No acute intracranial abnormality noted. CT of the cervical spine: Multilevel degenerative changes as described no acute abnormality noted. Electronically Signed   By: Oneil Devonshire M.D.   On: 07/24/2024 00:26   CT Cervical Spine Wo Contrast Result Date: 07/24/2024 CLINICAL DATA:  History of recent falls with dizziness and neck pain, initial encounter EXAM: CT HEAD WITHOUT CONTRAST CT CERVICAL SPINE WITHOUT CONTRAST TECHNIQUE: Multidetector CT imaging of the head and cervical spine was performed following the standard protocol without intravenous contrast. Multiplanar CT image reconstructions of the cervical spine were also generated. RADIATION DOSE REDUCTION: This exam was performed according to the departmental dose-optimization program which includes automated exposure control, adjustment of the mA and/or kV according to patient size and/or use of  iterative reconstruction technique. COMPARISON:  None  Available. FINDINGS: CT HEAD FINDINGS Brain: No evidence of acute infarction, hemorrhage, hydrocephalus, extra-axial collection or mass lesion/mass effect. Vascular: No hyperdense vessel or unexpected calcification. Skull: Normal. Negative for fracture or focal lesion. Sinuses/Orbits: No acute finding. Other: None. CT CERVICAL SPINE FINDINGS Alignment: Mild straightening of the normal cervical lordosis. Skull base and vertebrae: 7 cervical segments are well visualized. Vertebral body height is well maintained. Mild osteophytic changes are noted throughout the cervical spine. Mild central disc bulging is noted at C3-4 and mild left paracentral disc bulging at C5-6. No acute fracture or acute facet abnormality is noted. The odontoid is within normal limits. Soft tissues and spinal canal: Surrounding soft tissue structures are within normal limits. Upper chest: Visualized lung apices are unremarkable. Other: None IMPRESSION: CT of the head: No acute intracranial abnormality noted. CT of the cervical spine: Multilevel degenerative changes as described no acute abnormality noted. Electronically Signed   By: Oneil Devonshire M.D.   On: 07/24/2024 00:26    EKG: Independently reviewed.  Normal sinus rhythm.  Assessment/Plan Active Problems:   Hypothyroidism   Nausea & vomiting   ARF (acute renal failure) ([name])   Syncope   Closed left fibular fracture    Syncope -     cause not clear.  Syncope-most likely during vomiting episode could be vasovagal.  However given the prior history of cardiomyopathy with improved EF we will check 2D echo check orthostatics continue hydration.  Has had unremarkable cardiac cath in February 2024 at [facility] which showed trivial nonobstructive CAD with normal EF. Acute renal failure likely from nausea vomiting.  Gently hydrate follow metabolic panel. Nausea vomiting has been having intractable nausea vomiting for many months  now.  Had extensive workup including cholecystectomy sphincterotomy in March 2025 in [facility].  Gastric emptying study was unremarkable in April 2025.  LFTs are unremarkable KUB is pending.  Advance diet as tolerated.  Urine drug screen is pending.  Protonix . Diabetes mellitus type 1 uncontrolled with hyperglycemia patient states she has ran out of her insulin  pump supplies.  During recent admission patient was on Semglee  10 units which I have placed patient on once every 4 sliding scale for now.  Repeat metabolic panel is pending to make sure patient is not going in DKA. Close left fibula fracture consult orthopedics in the morning. Hypothyroidism on Synthroid . Leukocytosis could be reactionary check UA.  Since patient has acute renal failure with syncope and intractable nausea vomiting will need close monitoring further workup and more than 2 midnight stay.   DVT prophylaxis: Lovenox . Code Status: Full code. Family Communication: Discussed with patient. Disposition Plan: Medical floor. Consults called: None. Admission status: Observation.

## 2024-07-24 NOTE — Plan of Care (Signed)

## 2024-07-24 NOTE — ED Notes (Signed)
 Pt's IV doesn't have blood return, phlebotomy messaged for labs.

## 2024-07-24 NOTE — Progress Notes (Signed)
 Discussed with the patient and offered to either we order the heart monitor or have her own cardiologist at [facility] order the heart monitor, she prefer we order it. Staff message sent. Heart monitor will be mailed to her home.

## 2024-07-24 NOTE — ED Notes (Signed)
 Pt reports chest pain has improved, 3/10. Pt resting.

## 2024-07-24 NOTE — Progress Notes (Signed)
 PROGRESS NOTE    [name] [name]  FMW:[id number] DOB: 22-Apr-1961 DOA: 07/23/2024 PCP: Arlyss Eugene BRAVO, NP  Outpatient Specialists:     Brief Narrative:  Patient is a 63 year old female past medical history significant for recurrent syncope, congestive heart failure, type 1 diabetes mellitus, DKA, B12 deficiency, hyperlipidemia, hypertension.  Chronic kidney disease and peripheral neuropathy.  Patient was admitted with syncope.  According to the patient, she reported that blood pressure dropped significantly prior to syncope.  Patient also reported orthostatic symptoms.  Patient and patient's husband have asked for cardiology input.  Pursue echocardiograms.  Complete workup for syncope.  Likely telemetry monitoring on discharge.  Orthostatic vital signs.  07/24/2024: Patient seen.  Patient's husband was also on the phone during the consultation.  Recent UTI secondary to E Coli.  Will check troponin.  Assessment & Plan:   Principal Problem:   Syncope Active Problems:   Hypothyroidism   Nausea & vomiting   ARF (acute renal failure) ([name])   Diabetes mellitus type 1 ([name])   Closed left fibular fracture   Syncope: - Likely orthostatic syncope. - Reported orthostatic symptoms. - Patient also reported hypotension prior to episode. - Cardiac enzymes.  Pursue echocardiogram.  Telemetry monitoring.  Orthostatic vital signs. - Await cardiology input. - Adequate hydration. - Consider midodrine .    Acute renal failure: - Resolved. - Likely prerenal. - Serum creatinine of 0.96 (down from 1.77).  Nausea vomiting has been having intractable nausea vomiting for many months now.  Had extensive workup including cholecystectomy sphincterotomy in March 2025 in [facility].  Gastric emptying study was unremarkable in April 2025.  LFTs are unremarkable KUB is pending.  Advance diet as tolerated.  Urine drug screen is pending.  Protonix . 07/24/2024: Managed supportively.  Diabetes mellitus type 1  uncontrolled with hyperglycemia patient states she has ran out of her insulin  pump supplies.  During recent admission patient was on Semglee  10 units which I have placed patient on once every 4 sliding scale for now.  Repeat metabolic panel is pending to make sure patient is not going in DKA. 07/24/2024: Optimize blood sugar management.  Close left fibula fracture: - Orthopedic team is managing.  X-ray reviewed: -Acute, minimally displaced, anatomically aligned fracture of the distal left fibular metaphyseal region.  Hypothyroidism - On Synthroid .  Leukocytosis: - WBC of 13.3. - Continue to monitor WBC.   - Repeat UA  Volume depletion: - Adequate hydration.   DVT prophylaxis: Subcutaneous Lovenox  Code Status: Full code Family Communication: Husband was on the phone during the consultation Disposition Plan: Inpatient   Consultants:  Cardiology  Procedures:  None  Antimicrobials:  None   Subjective: No new complaints.  Objective: Vitals:   07/24/24 0600 07/24/24 0826 07/24/24 1026 07/24/24 1029  BP: 139/84   (!) 128/100  Pulse: 94   94  Resp: 15   18  Temp: 98.6 F (37 C)  98 F (36.7 C) 98 F (36.7 C)  TempSrc: Oral  Oral Oral  SpO2: 97% 100%  99%  Weight:      Height:        Intake/Output Summary (Last 24 hours) at 07/24/2024 1222 Last data filed at 07/24/2024 0130 Gross per 24 hour  Intake 1000 ml  Output --  Net 1000 ml   Filed Weights   07/23/24 2309  Weight: 54 kg    Examination:  General exam: Appears calm and comfortable  Respiratory system: Clear to auscultation.  Cardiovascular system: S1 & S2 heard. Gastrointestinal system:  Abdomen is soft and nontender. Central nervous system: Awake and alert.   Extremities: Left lower leg splint.  Data Reviewed: I have personally reviewed following labs and imaging studies  CBC: Recent Labs  Lab 07/18/24 0429 07/19/24 0411 07/20/24 0420 07/24/24 0037 07/24/24 0903  WBC 9.3 7.5 7.8 14.2*  13.3*  NEUTROABS 6.6  --  3.7 10.1* 9.9*  HGB 15.9* 12.8 14.0 14.7 14.8  HCT 50.9* 39.9 42.9 43.9 44.5  MCV 88.2 87.5 87.2 85.7 85.2  PLT 373 294 321 332 347   Basic Metabolic Panel: Recent Labs  Lab 07/18/24 0533 07/19/24 0411 07/20/24 0420 07/24/24 0037 07/24/24 0903  NA 139 136 137 136 138  K 3.8 3.8 4.1 3.6 4.0  CL 101 104 104 94* 97*  CO2 26 21* 22 28 28   GLUCOSE 177* 92 98 200* 180*  BUN 20 13 9  29* 23  CREATININE 0.65 0.84 0.70 1.77* 0.96  CALCIUM  8.7* 8.2* 8.9 9.1 8.8*  MG 2.6*  --   --   --   --   PHOS 3.5  --   --   --   --    GFR: Estimated Creatinine Clearance: 46.1 mL/min (by C-G formula based on SCr of 0.96 mg/dL). Liver Function Tests: Recent Labs  Lab 07/18/24 0533 07/24/24 0037 07/24/24 0903  AST 17 17 23   ALT 15 13 15   ALKPHOS 72 55 64  BILITOT 1.0 0.7 1.2  PROT 7.5 6.7 6.7  ALBUMIN 3.8 3.9 3.7   Recent Labs  Lab 07/18/24 0533 07/24/24 0037  LIPASE 26 29   No results for input(s): AMMONIA in the last 168 hours. Coagulation Profile: No results for input(s): INR, PROTIME in the last 168 hours. Cardiac Enzymes: No results for input(s): CKTOTAL, CKMB, CKMBINDEX, TROPONINI in the last 168 hours. BNP (last 3 results) No results for input(s): PROBNP in the last 8760 hours. HbA1C: No results for input(s): HGBA1C in the last 72 hours. CBG: Recent Labs  Lab 07/20/24 1613 07/20/24 2033 07/21/24 0720 07/21/24 1130 07/24/24 0823  GLUCAP 98 143* 108* 150* 171*   Lipid Profile: No results for input(s): CHOL, HDL, LDLCALC, TRIG, CHOLHDL, LDLDIRECT in the last 72 hours. Thyroid  Function Tests: Recent Labs    07/24/24 0903  TSH 29.976*   Anemia Panel: No results for input(s): VITAMINB12, FOLATE, FERRITIN, TIBC, IRON, RETICCTPCT in the last 72 hours. Urine analysis:    Component Value Date/Time   COLORURINE YELLOW 07/18/2024 0630   APPEARANCEUR CLOUDY (A) 07/18/2024 0630   LABSPEC 1.017  07/18/2024 0630   PHURINE 7.0 07/18/2024 0630   GLUCOSEU NEGATIVE 07/18/2024 0630   HGBUR NEGATIVE 07/18/2024 0630   BILIRUBINUR NEGATIVE 07/18/2024 0630   BILIRUBINUR negative 03/09/2021 1123   KETONESUR NEGATIVE 07/18/2024 0630   PROTEINUR NEGATIVE 07/18/2024 0630   UROBILINOGEN 0.2 03/09/2021 1123   UROBILINOGEN 0.2 10/28/2013 1723   NITRITE NEGATIVE 07/18/2024 0630   LEUKOCYTESUR LARGE (A) 07/18/2024 0630   Sepsis Labs: @LABRCNTIP (procalcitonin:4,lacticidven:4)  ) Recent Results (from the past 240 hours)  Urine Culture     Status: Abnormal   Collection Time: 07/18/24  6:30 AM   Specimen: Urine, Random  Result Value Ref Range Status   Specimen Description   Final    URINE, RANDOM Performed at [facility], [address] [place], [place] [address]    Special Requests   Final    NONE Performed at [facility], [address], [place], [place] [address]    Culture >=100,000  COLONIES/mL ESCHERICHIA COLI (A)  Final   Report Status 07/20/2024 FINAL  Final   Organism ID, Bacteria ESCHERICHIA COLI (A)  Final      Susceptibility   Escherichia coli - MIC*    AMPICILLIN  16 INTERMEDIATE Intermediate     CEFAZOLIN  <=4 SENSITIVE Sensitive     CEFEPIME  <=0.12 SENSITIVE Sensitive     CEFTRIAXONE  <=0.25 SENSITIVE Sensitive     CIPROFLOXACIN  >=4 RESISTANT Resistant     GENTAMICIN <=1 SENSITIVE Sensitive     IMIPENEM <=0.25 SENSITIVE Sensitive     NITROFURANTOIN  <=16 SENSITIVE Sensitive     TRIMETH /SULFA  <=20 SENSITIVE Sensitive     AMPICILLIN /SULBACTAM 8 SENSITIVE Sensitive     PIP/TAZO 8 SENSITIVE Sensitive ug/mL    * >=100,000 COLONIES/mL ESCHERICHIA COLI         Radiology Studies: DG Abd 1 View Result Date: 07/24/2024 EXAM: 1 VIEW XRAY OF THE ABDOMEN 07/24/2024 07:25:00 AM COMPARISON: 07/24/2024 CLINICAL HISTORY: [id number] Nausea \\T \ vomiting [id number]. Nausea and vomiting. Pt notes discomfort in abdomen. FINDINGS: BOWEL: Mild amount of  retained stool noted within the proximal ascending colon. Gas is seen throughout the remainder of the colon and rectum. No pathologic dilatation of the small bowel loops. SOFT TISSUES: No supine evidence for pneumoperitoneum. BONES: No acute osseous abnormalities. IMPRESSION: 1. No bowel obstruction. 2. Mild amount of retained stool in the proximal ascending colon. Electronically signed by: Waddell Calk MD 07/24/2024 07:49 AM EDT RP Workstation: [id number]   DG Ankle 2 Views Left Result Date: 07/24/2024 CLINICAL DATA:  Fall EXAM: LEFT ANKLE - 2 VIEW COMPARISON:  None Available. FINDINGS: Acute, oblique, minimally displaced, anatomically aligned fracture of the distal left fibular metaphyseal region extending to the level of the tibial plafond is noted. Normal overall alignment. Ankle mortise appears preserved. No ankle effusion. Extensive soft tissue swelling superficial to the lateral malleolus. Vascular calcifications noted. IMPRESSION: 1. Acute, minimally displaced, anatomically aligned fracture of the distal left fibular metaphyseal region. Electronically Signed   By: Dorethia Molt M.D.   On: 07/24/2024 03:15   DG Knee 1-2 Views Left Result Date: 07/24/2024 CLINICAL DATA:  Fall EXAM: LEFT KNEE - 1-2 VIEW COMPARISON:  None Available. FINDINGS: No evidence of fracture, dislocation, or joint effusion. No evidence of arthropathy or other focal bone abnormality. Vascular calcifications noted. IMPRESSION: Negative. Electronically Signed   By: Dorethia Molt M.D.   On: 07/24/2024 03:14   DG Pelvis 1-2 Views Result Date: 07/24/2024 CLINICAL DATA:  Fall EXAM: PELVIS - 1-2 VIEW COMPARISON:  None Available. FINDINGS: There is no evidence of pelvic fracture or diastasis. No pelvic bone lesions are seen. IMPRESSION: Negative. Electronically Signed   By: Dorethia Molt M.D.   On: 07/24/2024 03:14   CT Thoracic Spine Wo Contrast Result Date: 07/24/2024 CLINICAL DATA:  Multiple falls with back pain, initial  encounter EXAM: CT THORACIC AND LUMBAR SPINE WITHOUT CONTRAST TECHNIQUE: Multidetector CT imaging of the thoracic and lumbar spine was performed without contrast. Multiplanar CT image reconstructions were also generated. RADIATION DOSE REDUCTION: This exam was performed according to the departmental dose-optimization program which includes automated exposure control, adjustment of the mA and/or kV according to patient size and/or use of iterative reconstruction technique. COMPARISON:  07/18/2024 FINDINGS: CT THORACIC SPINE FINDINGS Alignment: Normal. Vertebrae: 12 thoracic vertebra are noted. Vertebral body height is well maintained. No compression deformity is seen. Minimal osteophytic changes are seen. No acute fracture is noted. Paraspinal and other soft tissues: No significant soft tissue abnormality is noted. The visualize  ribcage is within normal limits. Disc levels: No specific disc pathology is noted. CT LUMBAR SPINE FINDINGS Segmentation: 5 lumbar type vertebrae. Alignment: Alignment is within normal limits. Vertebrae: Vertebral body height is well maintained. No compression deformity is seen. Very mild osteophytic changes are seen. No acute fracture or acute facet abnormality is noted. Paraspinal and other soft tissues: Surrounding soft tissue structures show no acute abnormality. Disc levels: No specific disc pathology is noted. IMPRESSION: CT THORACIC SPINE IMPRESSION Mild degenerative changes without acute abnormality. CT LUMBAR SPINE IMPRESSION Mild degenerative change without acute abnormality. Electronically Signed   By: Oneil Devonshire M.D.   On: 07/24/2024 00:34   CT Lumbar Spine Wo Contrast Result Date: 07/24/2024 CLINICAL DATA:  Multiple falls with back pain, initial encounter EXAM: CT THORACIC AND LUMBAR SPINE WITHOUT CONTRAST TECHNIQUE: Multidetector CT imaging of the thoracic and lumbar spine was performed without contrast. Multiplanar CT image reconstructions were also generated. RADIATION  DOSE REDUCTION: This exam was performed according to the departmental dose-optimization program which includes automated exposure control, adjustment of the mA and/or kV according to patient size and/or use of iterative reconstruction technique. COMPARISON:  07/18/2024 FINDINGS: CT THORACIC SPINE FINDINGS Alignment: Normal. Vertebrae: 12 thoracic vertebra are noted. Vertebral body height is well maintained. No compression deformity is seen. Minimal osteophytic changes are seen. No acute fracture is noted. Paraspinal and other soft tissues: No significant soft tissue abnormality is noted. The visualize ribcage is within normal limits. Disc levels: No specific disc pathology is noted. CT LUMBAR SPINE FINDINGS Segmentation: 5 lumbar type vertebrae. Alignment: Alignment is within normal limits. Vertebrae: Vertebral body height is well maintained. No compression deformity is seen. Very mild osteophytic changes are seen. No acute fracture or acute facet abnormality is noted. Paraspinal and other soft tissues: Surrounding soft tissue structures show no acute abnormality. Disc levels: No specific disc pathology is noted. IMPRESSION: CT THORACIC SPINE IMPRESSION Mild degenerative changes without acute abnormality. CT LUMBAR SPINE IMPRESSION Mild degenerative change without acute abnormality. Electronically Signed   By: Oneil Devonshire M.D.   On: 07/24/2024 00:34   CT Head Wo Contrast Result Date: 07/24/2024 CLINICAL DATA:  History of recent falls with dizziness and neck pain, initial encounter EXAM: CT HEAD WITHOUT CONTRAST CT CERVICAL SPINE WITHOUT CONTRAST TECHNIQUE: Multidetector CT imaging of the head and cervical spine was performed following the standard protocol without intravenous contrast. Multiplanar CT image reconstructions of the cervical spine were also generated. RADIATION DOSE REDUCTION: This exam was performed according to the departmental dose-optimization program which includes automated exposure control,  adjustment of the mA and/or kV according to patient size and/or use of iterative reconstruction technique. COMPARISON:  None Available. FINDINGS: CT HEAD FINDINGS Brain: No evidence of acute infarction, hemorrhage, hydrocephalus, extra-axial collection or mass lesion/mass effect. Vascular: No hyperdense vessel or unexpected calcification. Skull: Normal. Negative for fracture or focal lesion. Sinuses/Orbits: No acute finding. Other: None. CT CERVICAL SPINE FINDINGS Alignment: Mild straightening of the normal cervical lordosis. Skull base and vertebrae: 7 cervical segments are well visualized. Vertebral body height is well maintained. Mild osteophytic changes are noted throughout the cervical spine. Mild central disc bulging is noted at C3-4 and mild left paracentral disc bulging at C5-6. No acute fracture or acute facet abnormality is noted. The odontoid is within normal limits. Soft tissues and spinal canal: Surrounding soft tissue structures are within normal limits. Upper chest: Visualized lung apices are unremarkable. Other: None IMPRESSION: CT of the head: No acute intracranial abnormality noted. CT of  the cervical spine: Multilevel degenerative changes as described no acute abnormality noted. Electronically Signed   By: Oneil Devonshire M.D.   On: 07/24/2024 00:26   CT Cervical Spine Wo Contrast Result Date: 07/24/2024 CLINICAL DATA:  History of recent falls with dizziness and neck pain, initial encounter EXAM: CT HEAD WITHOUT CONTRAST CT CERVICAL SPINE WITHOUT CONTRAST TECHNIQUE: Multidetector CT imaging of the head and cervical spine was performed following the standard protocol without intravenous contrast. Multiplanar CT image reconstructions of the cervical spine were also generated. RADIATION DOSE REDUCTION: This exam was performed according to the departmental dose-optimization program which includes automated exposure control, adjustment of the mA and/or kV according to patient size and/or use of  iterative reconstruction technique. COMPARISON:  None Available. FINDINGS: CT HEAD FINDINGS Brain: No evidence of acute infarction, hemorrhage, hydrocephalus, extra-axial collection or mass lesion/mass effect. Vascular: No hyperdense vessel or unexpected calcification. Skull: Normal. Negative for fracture or focal lesion. Sinuses/Orbits: No acute finding. Other: None. CT CERVICAL SPINE FINDINGS Alignment: Mild straightening of the normal cervical lordosis. Skull base and vertebrae: 7 cervical segments are well visualized. Vertebral body height is well maintained. Mild osteophytic changes are noted throughout the cervical spine. Mild central disc bulging is noted at C3-4 and mild left paracentral disc bulging at C5-6. No acute fracture or acute facet abnormality is noted. The odontoid is within normal limits. Soft tissues and spinal canal: Surrounding soft tissue structures are within normal limits. Upper chest: Visualized lung apices are unremarkable. Other: None IMPRESSION: CT of the head: No acute intracranial abnormality noted. CT of the cervical spine: Multilevel degenerative changes as described no acute abnormality noted. Electronically Signed   By: Oneil Devonshire M.D.   On: 07/24/2024 00:26        Scheduled Meds:  aspirin  EC  81 mg Oral Daily   enoxaparin  (LOVENOX ) injection  30 mg Subcutaneous Q24H   gabapentin   100 mg Oral QHS   insulin  aspart  0-9 Units Subcutaneous Q4H   insulin  glargine-yfgn  10 Units Subcutaneous Daily   levothyroxine   150 mcg Oral QAC breakfast   pantoprazole  (PROTONIX ) IV  40 mg Intravenous Q24H   Continuous Infusions:  lactated ringers  125 mL/hr at 07/24/24 0949     LOS: 0 days    Time spent: 35 minutes.    Leatrice Chapel, MD  [facility] Pager #: [phone] 7PM-7AM contact night coverage as above

## 2024-07-24 NOTE — Progress Notes (Signed)
 Orthopedic Tech Progress Note Patient Details:  [name] [name] [birth year] [id number]  Ortho Devices Type of Ortho Device: CAM walker Ortho Device/Splint Location: LLE Ortho Device/Splint Interventions: Ordered, Application   Post Interventions Patient Tolerated: Well Instructions Provided: Care of device   [name] [name] 07/24/2024, 6:57 AM

## 2024-07-24 NOTE — Plan of Care (Signed)
   Problem: Education: Goal: Ability to describe self-care measures that may prevent or decrease complications (Diabetes Survival Skills Education) will improve Outcome: Completed/Met

## 2024-07-24 NOTE — Consult Note (Signed)
 Cardiology Consultation   Patient ID: [name] [name] MRN: [id number]; DOB: 1961/10/19  Admit date: 07/23/2024 Date of Consult: 07/24/2024  PCP:  Arlyss Eugene BRAVO, NP   [facility] Providers Cardiologist:  [facility] - Dr. Redell Montenegro   Patient Profile: [name] [name] is a 63 y.o. female with a hx of NICM with recovered EF, poorly controlled type 1 DM on insulin  pump, HLD, CKD stage II and hypothyroidism who is being seen 07/24/2024 for the evaluation of syncope at the request of Dr. Rosario.  History of Present Illness: [name] [name] is a 63 year old female with past medical history of NICM with recovered EF, poorly controlled type 1 DM on insulin  pump, HLD, CKD stage II and hypothyroidism.  Patient also has a history of chronic chest pain.  Cardiac catheterization in 2012 demonstrated normal coronary arteries.  Repeat cardiac catheterization on 02/11/2017 continued with demonstrated normal coronary vessels.  EF was 35 to 40% on echocardiogram in 2018, improved to 55 to 60% on repeat echocardiogram in February 2020.  She has chronically uncontrolled diabetes with peripheral neuropathy.  She also had multiple previous hospitalization for DKA secondary to poor medical compliance.  Myoview  in August 2021 showed EF 64%, low risk study with no ischemia, there was inferior and apical hypokinesis suspected to be artifact.  She has a history of orthostatic dizziness.  ZIO monitor in September 2021 did not demonstrate any significant arrhythmia.  She used to be on compression stocking and as needed dose of midodrine .  Carotid Doppler in August 2022 showed 1 to 39% stenosis on the right, near normal minimal plaque on the left side.  I last saw the patient in September 2022 after her ED visit for persistent nausea, vomiting and diarrhea.  She was midodrine  2.5 mg 3 times a day at that time.  She was seen by Dr. Lavona in March 2023 at which time she continued to have her chronic chest discomfort.  She  remained on 2.5 mg 3 times a day of midodrine  at the time.  Since then, she has established with Dr. 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  This was felt to be an incidental finding and likely not contributing to her symptom.  She had frequent hospitalizations this year.  So far she had a 30 ED visit and 9 additional hospitalizations.  She was admitted in March with acute cystitis and acute cholecystitis and underwent robotic assisted cholecystectomy.  She later underwent ERCP on 3/24 and was found to have a small amount of debris's in the common bile duct that was cleared. She was admitted in June 2025 at [facility] and had UTI was Staph epidermidis in the urine.  She was treated with vancomycin .  Patient returned back to the hospital near the end of June with intractable nausea, vomiting and abdominal discomfort.  Of note, she had a normal gastric emptying study at [facility] in April 2025.  She was found to have chronic abdominal pain.  Hospital course complicated by acute urinary retention with UTI, this was treated with Foley catheter.  Patient was recently admitted to the hospital and discharged on 07/21/2024 due to intractable nausea and vomiting.  CT of abdomen showed gastroenteritis without SBO.  She was  treated with bowel rest and IV hydration.  Outpatient GI follow-up was recommended.  Unfortunately after her discharge, she continued to have nausea and vomiting and that was very weak.   She returned to the hospital on 07/23/2024 after 3 episodes of passing out spell.  She says she has not taken midodrine  for the past several month.  According to the patient, all 3 episodes of syncope occurred on the  same day.  She was talking to her husband and when she turned she took a few steps and passed out for the first time.  The second time, she got up to start walking and passed out after a few steps.  The third time, she was talking to her husband again and was standing and holding a walker when she felt she was going to pass out.  She states that she was able to say I feel like I am going to pass before she lost consciousness.  She reported her systolic blood pressure dropped from the 90s down to the 60s while changing the body position.  On arrival, creatinine was 1.77, this improved to 0.96 after hydration. She continued to have chest pain, troponin negative x 1.  White blood cell count 14.2.  Hemoglobin 14.7.  Abdominal x-ray showed no bowel obstruction.  Left ankle x-ray does show a acute minimally displaced fracture of the distal left fibular metaphyseal region.  She is currently wearing a boot.  EKG shows sinus rhythm with nonspecific T wave changes.  Telemetry showed no significant arrhythmia.   Past Medical History:  Diagnosis Date   Allergy    seasonal   Anxiety    B12 deficiency    Chest pain 04/05/2017   CHF (congestive heart failure) ([name])    Diabetes mellitus type 1, uncontrolled    dr. 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) 10/29/2013   Leg cramping    @ night (09/18/2013)   Migraine    once q couple months (11/26/2018)   Osteoporosis    Peripheral neuropathy    Umbilical hernia    unrepaired (09/18/2013)    Past Surgical History:  Procedure Laterality Date   BIOPSY  06/03/2019   Procedure: BIOPSY;  Surgeon: Charlanne Groom, MD;  Location: [facility];  Service: Endoscopy;;   BIOPSY  08/17/2020   Procedure:  BIOPSY;  Surgeon: Eda Iha, MD;  Location: [facility];  Service: Gastroenterology;;   BIOPSY  02/05/2023   Procedure: BIOPSY;  Surgeon: Aneita Gwendlyn DASEN, MD;  Location: [facility];  Service: Gastroenterology;;   BIOPSY  09/24/2023   Procedure: BIOPSY;  Surgeon: Wilhelmenia Aloha Raddle., MD;  Location: [facility];  Service: Gastroenterology;;   CESAREAN SECTION  1989   ENTEROSCOPY N/A 06/03/2019   Procedure: ENTEROSCOPY;  Surgeon: Charlanne Groom, MD;  Location: [facility];  Service: Endoscopy;  Laterality: N/A;   ESOPHAGOGASTRODUODENOSCOPY (EGD) WITH PROPOFOL  N/A 08/17/2020   Procedure: ESOPHAGOGASTRODUODENOSCOPY (EGD) WITH PROPOFOL ;  Surgeon: Eda Iha, MD;  Location: [facility];  Service: Gastroenterology;  Laterality: N/A;   ESOPHAGOGASTRODUODENOSCOPY (EGD) WITH PROPOFOL  N/A 02/05/2023   Procedure: ESOPHAGOGASTRODUODENOSCOPY (EGD) WITH PROPOFOL ;  Surgeon: Aneita Gwendlyn DASEN, MD;  Location: [facility];  Service: Gastroenterology;  Laterality: N/A;   ESOPHAGOGASTRODUODENOSCOPY (EGD) WITH PROPOFOL  N/A  09/24/2023   Procedure: ESOPHAGOGASTRODUODENOSCOPY (EGD) WITH PROPOFOL ;  Surgeon: Wilhelmenia Aloha Raddle., MD;  Location: [facility];  Service: Gastroenterology;  Laterality: N/A;   EUS N/A 09/24/2023   Procedure: UPPER ENDOSCOPIC ULTRASOUND (EUS) LINEAR;  Surgeon: Wilhelmenia Aloha Raddle., MD;  Location: [facility];  Service: Gastroenterology;  Laterality: N/A;   LAPAROSCOPIC ASSISTED VAGINAL HYSTERECTOMY  09/23/2012   Procedure: LAPAROSCOPIC ASSISTED VAGINAL HYSTERECTOMY;  Surgeon: Duwaine Blumenthal, DO;  Location: [facility];  Service: Gynecology;  Laterality: N/A;  pull Dr Jacqulyn instrument   LEFT HEART CATH AND CORONARY ANGIOGRAPHY N/A 02/11/2017   Procedure: Left Heart Cath and Coronary Angiography;  Surgeon: Dorn JINNY Lesches, MD;  Location: [facility] INVASIVE CV LAB;  Service: Cardiovascular;  Laterality: N/A;   TUBAL LIGATION Bilateral 1992     Home Medications:  Prior to Admission  medications   Medication Sig Start Date End Date Taking? Authorizing Provider  aspirin  EC 81 MG tablet Take 81 mg by mouth daily. Swallow whole.   Yes [provider]  cefadroxil  (DURICEF) 500 MG capsule Take 1 capsule (500 mg total) by mouth 2 (two) times daily for 5 days. 07/21/24 07/26/24 Yes Dahal, Chapman, MD  Continuous Glucose Sensor (DEXCOM G7 SENSOR) MISC Inject 1 each into the skin See admin instructions. Place 1 new sensor into the skin every 10 days 01/30/24  Yes [provider]  gabapentin  (NEURONTIN ) 100 MG capsule Take 100 mg by mouth at bedtime.   Yes [provider]  Glucagon, rDNA, (GLUCAGON EMERGENCY) 1 MG KIT Inject 1 mg into the skin See admin instructions. Inject one mg into the skin see administration instructions. Follow package directions for low blood sugar. 06/18/23  Yes [provider]  Insulin  Disposable Pump (OMNIPOD 5 G7 PODS, GEN 5,) MISC Inject 1 Device into the skin every 3 (three) days.   Yes [provider]  levothyroxine  (SYNTHROID ) 150 MCG tablet Take 1 tablet (150 mcg total) by mouth daily before breakfast. 06/01/24  Yes [name], [name], MD  NOVOLOG  FLEXPEN 100 UNIT/ML FlexPen Inject 10 Units into the skin 3 (three) times daily with meals. Plus sliding scale TID Patient taking differently: Inject 10 Units into the skin See admin instructions. Inject 10 units into the skin three times a day with meals, PLUS sliding scale- ONLY WHEN NOT USING THE OMNIPOD 12/08/23  Yes [name], MD  omega-3 acid ethyl esters (LOVAZA ) 1 g capsule Take 2 capsules (2 g total) by mouth 2 (two) times daily. Patient taking differently: Take 2 g by mouth daily as needed (for circulation). 09/13/23  Yes Will Almarie MATSU, MD  ondansetron  (ZOFRAN ) 4 MG tablet Take 2 tablets (8 mg total) by mouth every 8 (eight) hours as needed for nausea or vomiting. 05/31/24  Yes [name], [name], MD  promethazine  (PHENERGAN ) 25 MG suppository Place 25 mg  rectally every 6 (six) hours as needed for nausea or vomiting.   Yes [provider]  promethazine  (PHENERGAN ) 25 MG tablet Take 1 tablet (25 mg total) by mouth every 6 (six) hours as needed for nausea or vomiting (to take instead of rectal suppository). 06/25/24  Yes Dreama Longs, MD  TOUJEO  SOLOSTAR 300 UNIT/ML Solostar Pen Inject 25 Units into the skin at bedtime. Patient taking differently: Inject 25 Units into the skin See admin instructions. Inject 25 units into the skin at bedtime ONLY WHEN NOT USING THE OMNIPOD 12/08/23  Yes [name], MD  acetaminophen  (TYLENOL ) 650 MG suppository Place 1 suppository (650 mg total)  rectally every 6 (six) hours as needed for mild pain (pain score 1-3) or fever (or Fever >/= 101). Patient not taking: Reported on 07/24/2024 06/06/24   Will Almarie MATSU, MD  azelastine  (ASTELIN ) 0.1 % nasal spray Place 1 spray into both nostrils 2 (two) times daily as needed for rhinitis or allergies. Patient not taking: Reported on 07/24/2024 11/04/23   [provider]  BAQSIMI ONE PACK 3 MG/DOSE POWD Place 3 mg into the nose as needed (severe low blood sugar). 01/30/24   [provider]  HYDROcodone -acetaminophen  (NORCO/VICODIN) 5-325 MG tablet Take 1 tablet by mouth every 6 (six) hours as needed for up to 5 days for moderate pain (pain score 4-6). Patient not taking: Reported on 07/24/2024 07/21/24 07/26/24  Arlice Reichert, MD  pantoprazole  (PROTONIX ) 40 MG tablet Take 1 tablet (40 mg total) by mouth 2 (two) times daily. Take 30 minutes before breakfast and dinner Patient not taking: Reported on 07/24/2024 01/16/24   [name], [name], NP  tiZANidine (ZANAFLEX) 4 MG tablet Take 4 mg by mouth every 8 (eight) hours as needed for muscle spasms. Patient not taking: Reported on 07/24/2024 05/27/24 05/27/25  [provider]    Scheduled Meds:  aspirin  EC  81 mg Oral Daily   enoxaparin  (LOVENOX ) injection  30 mg Subcutaneous Q24H    gabapentin   100 mg Oral QHS   insulin  aspart  0-9 Units Subcutaneous Q4H   insulin  glargine-yfgn  10 Units Subcutaneous Daily   levothyroxine   150 mcg Oral QAC breakfast   pantoprazole  (PROTONIX ) IV  40 mg Intravenous Q24H   Continuous Infusions:  lactated ringers  125 mL/hr at 07/24/24 1313   PRN Meds: HYDROmorphone  (DILAUDID ) injection  Allergies:    Allergies  Allergen Reactions   Codeine Hives and Anxiety   Ciprofloxacin  Diarrhea, Nausea Only and Other (See Comments)    Caused extreme nausea   Flagyl  [Metronidazole ] Nausea Only and Other (See Comments)    Can tolerate ONLY VIA IV (otherwise, SEVERE NAUSEA)    Social History:   Social History   Socioeconomic History   Marital status: Married    Spouse name: Not on file   Number of children: 2   Years of education: Not on file   Highest education level: Not on file  Occupational History   Occupation: UNEMPLOYED    Employer: UNEMPLOYED    Comment: homemaker  Tobacco Use   Smoking status: Former    Current packs/day: 0.00    Average packs/day: 0.1 packs/day for 10.0 years (1.2 ttl pk-yrs)    Types: Cigarettes    Start date: 12/10/1980    Quit date: 12/10/1990    Years since quitting: 33.6   Smokeless tobacco: Never  Vaping Use   Vaping status: Never Used  Substance and Sexual Activity   Alcohol use: Never    Alcohol/week: 0.0 standard drinks of alcohol   Drug use: Not Currently    Types: Marijuana    Comment: last use 2 days ago   Sexual activity: Not Currently  Other Topics Concern   Not on file  Social History Narrative   Regular exercise- no    Diet- lots of mountain dew, limits fast foods    Social Drivers of Health   Financial Resource Strain: Low Risk  (04/25/2024)   Received from [facility]   Overall Financial Resource Strain (CARDIA)    Difficulty of Paying Living Expenses: Not hard at all  Food Insecurity: No Food Insecurity (07/24/2024)   Hunger Vital Sign  Worried About Programme researcher, broadcasting/film/video  in the Last Year: Never true    Ran Out of Food in the Last Year: Never true  Transportation Needs: No Transportation Needs (07/24/2024)   PRAPARE - Administrator, Civil Service (Medical): No    Lack of Transportation (Non-Medical): No  Physical Activity: Unknown (04/25/2024)   Received from [facility]   Exercise Vital Sign    On average, how many days per week do you engage in moderate to strenuous exercise (like a brisk walk)?: 0 days    Minutes of Exercise per Session: Not on file  Stress: No Stress Concern Present (04/25/2024)   Received from [facility] of Occupational [facility] - Occupational Stress Questionnaire    Feeling of Stress : Only a little  Social Connections: Moderately Isolated (07/18/2024)   Social Connection and Isolation Panel    Frequency of Communication with Friends and Family: Never    Frequency of Social Gatherings with Friends and Family: Never    Attends Religious Services: Never    Database administrator or Organizations: No    Attends Engineer, structural: 1 to 4 times per year    Marital Status: Married  Catering manager Violence: Not At Risk (07/24/2024)   Humiliation, Afraid, Rape, and Kick questionnaire    Fear of Current or Ex-Partner: No    Emotionally Abused: No    Physically Abused: No    Sexually Abused: No    Family History:    Family History  Problem Relation Age of Onset   Heart failure Mother 35       Her heart skipped a beat and stopped - per pt   Heart failure Brother    Diabetes Brother    Diabetes Maternal Grandmother    Alzheimer's disease Maternal Grandmother    Coronary artery disease Maternal Grandmother    Heart attack Maternal Grandfather    Diabetes Other    Heart disease Other    Colon polyps Neg Hx    Esophageal cancer Neg Hx    Liver cancer Neg Hx    Pancreatic cancer Neg Hx    Stomach cancer Neg Hx    Crohn's disease Neg Hx    Rectal cancer Neg Hx    Colon cancer Neg Hx       ROS:  Please see the history of present illness.   All other ROS reviewed and negative.     Physical Exam/Data: Vitals:   07/24/24 0826 07/24/24 1026 07/24/24 1029 07/24/24 1257  BP:   (!) 128/100   Pulse:   94 79  Resp:   18 18  Temp:  98 F (36.7 C) 98 F (36.7 C) 98 F (36.7 C)  TempSrc:  Oral Oral Oral  SpO2: 100%  99% 92%  Weight:    55.3 kg  Height:    4' 11 (1.499 m)    Intake/Output Summary (Last 24 hours) at 07/24/2024 1536 Last data filed at 07/24/2024 1313 Gross per 24 hour  Intake 1000 ml  Output 450 ml  Net 550 ml      07/24/2024   12:57 PM 07/23/2024   11:09 PM 07/18/2024    7:19 PM  Last 3 Weights  Weight (lbs) 121 lb 14.4 oz 119 lb 119 lb  Weight (kg) 55.293 kg 53.978 kg 53.978 kg     Body mass index is 24.62 kg/m.  General:  Well nourished, well developed, in no acute distress HEENT:  normal Neck: no JVD Vascular: No carotid bruits; Distal pulses 2+ bilaterally Cardiac:  normal S1, S2; RRR; no murmur  Lungs:  clear to auscultation bilaterally, no wheezing, rhonchi or rales  Abd: soft, nontender, no hepatomegaly  Ext: no edema Musculoskeletal:  No deformities, BUE and BLE strength normal and equal Skin: warm and dry  Neuro:  CNs 2-12 intact, no focal abnormalities noted Psych:  Normal affect   EKG:  The EKG was personally reviewed and demonstrates: Normal sinus rhythm, nonspecific T wave changes. Telemetry:  Telemetry was personally reviewed and demonstrates: Normal sinus rhythm, no significant ventricular ectopy  Relevant CV Studies:  Echo 11/23/2021  1. Left ventricular ejection fraction, by estimation, is 55 to 60%. The  left ventricle has normal function. The left ventricle has no regional  wall motion abnormalities. Left ventricular diastolic parameters are  consistent with Grade I diastolic  dysfunction (impaired relaxation).   2. Right ventricular systolic function is normal. The right ventricular  size is normal. There is normal  pulmonary artery systolic pressure.   3. The mitral valve is normal in structure. Trivial mitral valve  regurgitation. No evidence of mitral stenosis.   4. The aortic valve is tricuspid. Aortic valve regurgitation is not  visualized. No aortic stenosis is present.   5. The inferior vena cava is normal in size with greater than 50%  respiratory variability, suggesting right atrial pressure of 3 mmHg.      Cath 02/01/2023 Hemodynamics:   1. Central Aortic Pressure -112/55 mmHg. Mean 75 mmHg  2. Left Ventricular Pressure-109/10 mmHg  Additional comments on on hemodynamics:  LVEDP 12 mmHg.   Coronary Angiography:   Antatomically right dominant  No visible fluoroscopic coronary calcification   Left Main: Angiographically normal.  Left anterior descending artery: Mild to moderately tortuous.  Moderate  sized vessel proximally, small mid to distally.  No focal angiographic  plaque or obstructive stenosis.  Generally smooth in contour.  Diagonal branches: None  Ramus intermedius: Moderate size vessel.  No focal angiographic plaque or  obstructive stenosis.  Generally smooth contour.  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  Left Circumflex:  Anatomically nondominant.  Tortuous.  Generally smooth  in contour.  No focal angiographic plaque or obstructive stenosis.  Obtuse Marginal branches: 2 distal marginal branches.  Both moderately  tortuous with no focal angiographic stenosis.  Right Coronary Artery:  Anatomically dominant.  Generally smooth in  contour.  Moderate sized vessel.  No focal angiographic plaque or  obstructive stenosis.  Posterior Descending Artery: Small vessel.  No focal angiographic plaque  or obstructive stenosis.SABRA  Posterolateral ventricular branch: Small vessel.  No focal angiographic  plaque or obstructive stenosis.   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  Normal left-ventricular function   RECOMMENDATIONS:  Continue with guideline directed risk factor modification and medical  management.   Laboratory Data: High Sensitivity Troponin:   Recent Labs  Lab 07/24/24  1357  TROPONINIHS 3     Chemistry Recent Labs  Lab 07/18/24 0533 07/19/24 0411 07/20/24 0420 07/24/24 0037 07/24/24 0903  NA 139   < > 137 136 138  K 3.8   < > 4.1 3.6 4.0  CL 101   < > 104 94* 97*  CO2 26   < > 22 28 28   GLUCOSE 177*   < > 98 200* 180*  BUN 20   < > 9 29* 23  CREATININE 0.65   < > 0.70 1.77* 0.96  CALCIUM  8.7*   < > 8.9 9.1 8.8*  MG 2.6*  --   --   --   --   GFRNONAA >60   < > >60 32* >60  ANIONGAP 12   < > 11 14 13    < > = values in this interval not displayed.    Recent Labs  Lab 07/18/24 0533 07/24/24 0037 07/24/24 0903  PROT 7.5 6.7 6.7  ALBUMIN 3.8 3.9 3.7  AST 17 17 23   ALT 15 13 15   ALKPHOS 72 55 64  BILITOT 1.0 0.7 1.2   Lipids No results for input(s): CHOL, TRIG, HDL, LABVLDL, LDLCALC, CHOLHDL in the last 168 hours.  Hematology Recent Labs  Lab 07/20/24 0420 07/24/24 0037 07/24/24 0903  WBC 7.8 14.2* 13.3*  RBC 4.92 5.12* 5.22*  HGB 14.0 14.7 14.8  HCT 42.9 43.9 44.5  MCV 87.2 85.7 85.2  MCH 28.5 28.7 28.4  MCHC 32.6 33.5 33.3  RDW 15.7* 16.2* 15.9*  PLT 321 332 347   Thyroid   Recent Labs  Lab 07/24/24 0903  TSH 29.976*    BNPNo results for input(s): BNP,  PROBNP in the last 168 hours.  DDimer No results for input(s): DDIMER in the last 168 hours.  Radiology/Studies:  DG Abd 1 View Result Date: 07/24/2024 EXAM: 1 VIEW XRAY OF THE ABDOMEN 07/24/2024 07:25:00 AM COMPARISON: 07/24/2024 CLINICAL HISTORY: 822942 Nausea \\T \ vomiting [id number]. Nausea and vomiting. Pt notes discomfort in abdomen. FINDINGS: BOWEL: Mild amount of retained stool noted within the proximal ascending colon. Gas is seen throughout the remainder of the colon and rectum. No pathologic dilatation of the small bowel loops. SOFT TISSUES: No supine evidence for pneumoperitoneum. BONES: No acute osseous abnormalities. IMPRESSION: 1. No bowel obstruction. 2. Mild amount of retained stool in the proximal ascending colon. Electronically signed by: Waddell Calk MD 07/24/2024 07:49 AM EDT RP Workstation: [id number]   DG Ankle 2 Views Left Result Date: 07/24/2024 CLINICAL DATA:  Fall EXAM: LEFT ANKLE - 2 VIEW COMPARISON:  None Available. FINDINGS: Acute, oblique, minimally displaced, anatomically aligned fracture of the distal left fibular metaphyseal region extending to the level of the tibial plafond is noted. Normal overall alignment. Ankle mortise appears preserved. No ankle effusion. Extensive soft tissue swelling superficial to the lateral malleolus. Vascular calcifications noted. IMPRESSION: 1. Acute, minimally displaced, anatomically aligned fracture of the distal left fibular metaphyseal region. Electronically Signed   By: Dorethia Molt M.D.   On: 07/24/2024 03:15   DG Knee 1-2 Views Left Result Date: 07/24/2024 CLINICAL DATA:  Fall EXAM: LEFT KNEE - 1-2 VIEW COMPARISON:  None Available. FINDINGS: No evidence of fracture, dislocation, or joint effusion. No evidence of arthropathy or other focal bone abnormality. Vascular calcifications noted. IMPRESSION: Negative. Electronically Signed   By: Dorethia Molt M.D.   On: 07/24/2024 03:14   DG Pelvis 1-2 Views Result Date:  07/24/2024 CLINICAL DATA:  Fall EXAM: PELVIS - 1-2 VIEW COMPARISON:  None  Available. FINDINGS: There is no evidence of pelvic fracture or diastasis. No pelvic bone lesions are seen. IMPRESSION: Negative. Electronically Signed   By: Dorethia Molt M.D.   On: 07/24/2024 03:14   CT Thoracic Spine Wo Contrast Result Date: 07/24/2024 CLINICAL DATA:  Multiple falls with back pain, initial encounter EXAM: CT THORACIC AND LUMBAR SPINE WITHOUT CONTRAST TECHNIQUE: Multidetector CT imaging of the thoracic and lumbar spine was performed without contrast. Multiplanar CT image reconstructions were also generated. RADIATION DOSE REDUCTION: This exam was performed according to the departmental dose-optimization program which includes automated exposure control, adjustment of the mA and/or kV according to patient size and/or use of iterative reconstruction technique. COMPARISON:  07/18/2024 FINDINGS: CT THORACIC SPINE FINDINGS Alignment: Normal. Vertebrae: 12 thoracic vertebra are noted. Vertebral body height is well maintained. No compression deformity is seen. Minimal osteophytic changes are seen. No acute fracture is noted. Paraspinal and other soft tissues: No significant soft tissue abnormality is noted. The visualize ribcage is within normal limits. Disc levels: No specific disc pathology is noted. CT LUMBAR SPINE FINDINGS Segmentation: 5 lumbar type vertebrae. Alignment: Alignment is within normal limits. Vertebrae: Vertebral body height is well maintained. No compression deformity is seen. Very mild osteophytic changes are seen. No acute fracture or acute facet abnormality is noted. Paraspinal and other soft tissues: Surrounding soft tissue structures show no acute abnormality. Disc levels: No specific disc pathology is noted. IMPRESSION: CT THORACIC SPINE IMPRESSION Mild degenerative changes without acute abnormality. CT LUMBAR SPINE IMPRESSION Mild degenerative change without acute abnormality. Electronically Signed    By: Oneil Devonshire M.D.   On: 07/24/2024 00:34   CT Lumbar Spine Wo Contrast Result Date: 07/24/2024 CLINICAL DATA:  Multiple falls with back pain, initial encounter EXAM: CT THORACIC AND LUMBAR SPINE WITHOUT CONTRAST TECHNIQUE: Multidetector CT imaging of the thoracic and lumbar spine was performed without contrast. Multiplanar CT image reconstructions were also generated. RADIATION DOSE REDUCTION: This exam was performed according to the departmental dose-optimization program which includes automated exposure control, adjustment of the mA and/or kV according to patient size and/or use of iterative reconstruction technique. COMPARISON:  07/18/2024 FINDINGS: CT THORACIC SPINE FINDINGS Alignment: Normal. Vertebrae: 12 thoracic vertebra are noted. Vertebral body height is well maintained. No compression deformity is seen. Minimal osteophytic changes are seen. No acute fracture is noted. Paraspinal and other soft tissues: No significant soft tissue abnormality is noted. The visualize ribcage is within normal limits. Disc levels: No specific disc pathology is noted. CT LUMBAR SPINE FINDINGS Segmentation: 5 lumbar type vertebrae. Alignment: Alignment is within normal limits. Vertebrae: Vertebral body height is well maintained. No compression deformity is seen. Very mild osteophytic changes are seen. No acute fracture or acute facet abnormality is noted. Paraspinal and other soft tissues: Surrounding soft tissue structures show no acute abnormality. Disc levels: No specific disc pathology is noted. IMPRESSION: CT THORACIC SPINE IMPRESSION Mild degenerative changes without acute abnormality. CT LUMBAR SPINE IMPRESSION Mild degenerative change without acute abnormality. Electronically Signed   By: Oneil Devonshire M.D.   On: 07/24/2024 00:34   CT Head Wo Contrast Result Date: 07/24/2024 CLINICAL DATA:  History of recent falls with dizziness and neck pain, initial encounter EXAM: CT HEAD WITHOUT CONTRAST CT CERVICAL SPINE  WITHOUT CONTRAST TECHNIQUE: Multidetector CT imaging of the head and cervical spine was performed following the standard protocol without intravenous contrast. Multiplanar CT image reconstructions of the cervical spine were also generated. RADIATION DOSE REDUCTION: This exam was performed according to the departmental dose-optimization  program which includes automated exposure control, adjustment of the mA and/or kV according to patient size and/or use of iterative reconstruction technique. COMPARISON:  None Available. FINDINGS: CT HEAD FINDINGS Brain: No evidence of acute infarction, hemorrhage, hydrocephalus, extra-axial collection or mass lesion/mass effect. Vascular: No hyperdense vessel or unexpected calcification. Skull: Normal. Negative for fracture or focal lesion. Sinuses/Orbits: No acute finding. Other: None. CT CERVICAL SPINE FINDINGS Alignment: Mild straightening of the normal cervical lordosis. Skull base and vertebrae: 7 cervical segments are well visualized. Vertebral body height is well maintained. Mild osteophytic changes are noted throughout the cervical spine. Mild central disc bulging is noted at C3-4 and mild left paracentral disc bulging at C5-6. No acute fracture or acute facet abnormality is noted. The odontoid is within normal limits. Soft tissues and spinal canal: Surrounding soft tissue structures are within normal limits. Upper chest: Visualized lung apices are unremarkable. Other: None IMPRESSION: CT of the head: No acute intracranial abnormality noted. CT of the cervical spine: Multilevel degenerative changes as described no acute abnormality noted. Electronically Signed   By: Oneil Devonshire M.D.   On: 07/24/2024 00:26   CT Cervical Spine Wo Contrast Result Date: 07/24/2024 CLINICAL DATA:  History of recent falls with dizziness and neck pain, initial encounter EXAM: CT HEAD WITHOUT CONTRAST CT CERVICAL SPINE WITHOUT CONTRAST TECHNIQUE: Multidetector CT imaging of the head and cervical  spine was performed following the standard protocol without intravenous contrast. Multiplanar CT image reconstructions of the cervical spine were also generated. RADIATION DOSE REDUCTION: This exam was performed according to the departmental dose-optimization program which includes automated exposure control, adjustment of the mA and/or kV according to patient size and/or use of iterative reconstruction technique. COMPARISON:  None Available. FINDINGS: CT HEAD FINDINGS Brain: No evidence of acute infarction, hemorrhage, hydrocephalus, extra-axial collection or mass lesion/mass effect. Vascular: No hyperdense vessel or unexpected calcification. Skull: Normal. Negative for fracture or focal lesion. Sinuses/Orbits: No acute finding. Other: None. CT CERVICAL SPINE FINDINGS Alignment: Mild straightening of the normal cervical lordosis. Skull base and vertebrae: 7 cervical segments are well visualized. Vertebral body height is well maintained. Mild osteophytic changes are noted throughout the cervical spine. Mild central disc bulging is noted at C3-4 and mild left paracentral disc bulging at C5-6. No acute fracture or acute facet abnormality is noted. The odontoid is within normal limits. Soft tissues and spinal canal: Surrounding soft tissue structures are within normal limits. Upper chest: Visualized lung apices are unremarkable. Other: None IMPRESSION: CT of the head: No acute intracranial abnormality noted. CT of the cervical spine: Multilevel degenerative changes as described no acute abnormality noted. Electronically Signed   By: Oneil Devonshire M.D.   On: 07/24/2024 00:26     Assessment and Plan: Syncope: Patient has a prior history of syncope and a nonischemic cardiomyopathy.  EF normalized.  Multiple previous cardiac catheterization showed essentially clean coronary arteries, last heart cath in February 2024 and [facility].  She used to be on midodrine  2.5 mg 3 times a day for many years, however this was  discontinued several months ago.  She was recently admitted for an nausea, vomiting and abdominal discomfort.  She has a chronic history of abdominal pain.  Gastric emptying test in April 2025 at [facility] was normal.  She underwent cholecystectomy and ERCP in March 2025.  Most recent orthostatic vital signs obtained this afternoon after IV hydration showed BP lying down 94/65, sitting up 84/60, standing 0 minutes 69/59, standing 3 minutes 76/62.  Since patient has  received IV hydration, this orthostatic vital sign is not going to be very accurate.  I would recommend the patient get back on midodrine  2.5 mg 3 times a day.  - Obtain echocardiogram.  Will discuss with MD to see if need heart monitor, she had a similar syncope in the past and the previous heart monitor in 2021 did not show significant arrhythmia.  Suspicion for significant arrhythmia is very low.  - Symptom consistent with history of orthostatic hypotension worsened by nausea and vomiting.  There was evidence of dehydration with creatinine of 1.7 that improved to 0.9 after hydration  Chronic chest pain: Spoke to the patient and husband who requested cardiac catheterization, however she has essentially normal coronary arteries on previous cardiac catheterization in February 2024, over the years she had a normal heart cath in 2012 and 2018.  I do not think she needed an repeat cardiac catheterization at this time.  If chest pain worsens, a coronary CTA is likely less invasive option.  AKI: Evidence of dehydration on arrival with creatinine 1.7 after days of nausea and vomiting.  Improved to 0.9 after hydration.  Left fibular fracture: Likely was the result of recurrent syncope.  Patient is currently wearing a boot.  History of nonischemic cardiomyopathy with normalized EF: Repeat echocardiogram  History of poorly controlled type 1 diabetes on insulin  pump: Managed by endocrinology service as outpatient  Hypothyroidism: TSH quite elevated at  30.  Patient says she only missed 2 days of thyroid  medication due to nausea and vomiting.      Risk Assessment/Risk Scores:      For questions or updates, please contact [facility] Please consult [email or web address] for contact info under    Signed, Riah Kehoe, [place]  07/24/2024 3:36 PM

## 2024-07-24 NOTE — ED Notes (Signed)
 Pt reports chest pain has resolved. Up eating breakfast at this time.

## 2024-07-24 NOTE — ED Notes (Signed)
 Ortho tech at bedside

## 2024-07-25 ENCOUNTER — Inpatient Hospital Stay (HOSPITAL_COMMUNITY)

## 2024-07-25 DIAGNOSIS — R55 Syncope and collapse: Secondary | ICD-10-CM | POA: Diagnosis not present

## 2024-07-25 LAB — CBC WITH DIFFERENTIAL/PLATELET
Abs Immature Granulocytes: 0.03 K/uL (ref 0.00–0.07)
Basophils Absolute: 0 K/uL (ref 0.0–0.1)
Basophils Relative: 0 %
Eosinophils Absolute: 0.1 K/uL (ref 0.0–0.5)
Eosinophils Relative: 2 %
HCT: 39.8 % (ref 36.0–46.0)
Hemoglobin: 13.2 g/dL (ref 12.0–15.0)
Immature Granulocytes: 0 %
Lymphocytes Relative: 31 %
Lymphs Abs: 2.7 K/uL (ref 0.7–4.0)
MCH: 28.4 pg (ref 26.0–34.0)
MCHC: 33.2 g/dL (ref 30.0–36.0)
MCV: 85.6 fL (ref 80.0–100.0)
Monocytes Absolute: 0.9 K/uL (ref 0.1–1.0)
Monocytes Relative: 10 %
Neutro Abs: 5 K/uL (ref 1.7–7.7)
Neutrophils Relative %: 57 %
Platelets: 325 K/uL (ref 150–400)
RBC: 4.65 MIL/uL (ref 3.87–5.11)
RDW: 15.9 % — ABNORMAL HIGH (ref 11.5–15.5)
WBC: 8.8 K/uL (ref 4.0–10.5)
nRBC: 0 % (ref 0.0–0.2)

## 2024-07-25 LAB — RENAL FUNCTION PANEL
Albumin: 3.2 g/dL — ABNORMAL LOW (ref 3.5–5.0)
Anion gap: 8 (ref 5–15)
BUN: 12 mg/dL (ref 8–23)
CO2: 26 mmol/L (ref 22–32)
Calcium: 8.8 mg/dL — ABNORMAL LOW (ref 8.9–10.3)
Chloride: 100 mmol/L (ref 98–111)
Creatinine, Ser: 0.8 mg/dL (ref 0.44–1.00)
GFR, Estimated: >60 mL/min (ref >60)
Glucose, Bld: 111 mg/dL — ABNORMAL HIGH (ref 70–99)
Phosphorus: 2.9 mg/dL (ref 2.5–4.6)
Potassium: 4.1 mmol/L (ref 3.5–5.1)
Sodium: 134 mmol/L — ABNORMAL LOW (ref 135–145)

## 2024-07-25 LAB — ECHOCARDIOGRAM COMPLETE
AR max vel: 2.24 cm2
AV Area VTI: 2.25 cm2
AV Area mean vel: 2.14 cm2
AV Mean grad: 4 mmHg
AV Peak grad: 7.4 mmHg
Ao pk vel: 1.36 m/s
Area-P 1/2: 3.21 cm2
Height: 59 in
S' Lateral: 2.4 cm
Weight: 1950.4 [oz_av]

## 2024-07-25 LAB — GLUCOSE, CAPILLARY
Glucose-Capillary: 155 mg/dL — ABNORMAL HIGH (ref 70–99)
Glucose-Capillary: 119 mg/dL — ABNORMAL HIGH (ref 70–99)
Glucose-Capillary: 141 mg/dL — ABNORMAL HIGH (ref 70–99)
Glucose-Capillary: 75 mg/dL (ref 70–99)
Glucose-Capillary: 154 mg/dL — ABNORMAL HIGH (ref 70–99)

## 2024-07-25 LAB — MAGNESIUM: Magnesium: 1.8 mg/dL (ref 1.7–2.4)

## 2024-07-25 MED ORDER — PANTOPRAZOLE SODIUM 40 MG PO TBEC
40.0000 mg | DELAYED_RELEASE_TABLET | Freq: Every day | ORAL | Status: DC
  Administered 2024-07-26 – 2024-07-27 (×2): 40 mg via ORAL
  Filled 2024-07-25 (×2): qty 1

## 2024-07-25 MED ORDER — SODIUM CHLORIDE (PF) 0.9 % IJ SOLN
INTRAMUSCULAR | Status: AC
  Administered 2024-07-25: 10 mL
  Filled 2024-07-25: qty 10

## 2024-07-25 MED ORDER — METOCLOPRAMIDE HCL 5 MG/ML IJ SOLN
5.0000 mg | Freq: Four times a day (QID) | INTRAMUSCULAR | Status: AC
  Administered 2024-07-25 (×2): 5 mg via INTRAVENOUS
  Filled 2024-07-25 (×2): qty 2

## 2024-07-25 NOTE — Progress Notes (Signed)
 Echocardiogram 2D Echocardiogram has been performed.  Khalie Wince N Karisma Meiser,RDCS 07/25/2024, 3:39 PM

## 2024-07-25 NOTE — Evaluation (Signed)
 Occupational Therapy Evaluation Patient Details Name: [name] [name] MRN: [id number] DOB: [birth year] Today's Date: 07/25/2024   History of Present Illness   63 yo female [facility]/23/24 for nausea, vomiting, and syncope. PMH includes DM-2, HTN, HLD, hypothyroidism, orthostatic hypotension, ischemic cardiomyopathy, hx of syncope.     Clinical Impressions Pt admitted for above, PTA pt reports being ind/mod I at home for ADLs, driving and ambulating with Rollator. Pt currently limited by orthostatic BP limiting her OOB standing tolerance. Attempted counter pressure exercises but they only slightly help pt stand longer before onset of symptoms. Educated her on use of LLE CAM boot, pt able to don it independently and completes ADLs with setup A. OT to continue following pt acutely to progress pt as able. Anticipate no post acute OT needs once pt progresses with medical complications.      If plan is discharge home, recommend the following:   Assist for transportation     Functional Status Assessment   Patient has had a recent decline in their functional status and demonstrates the ability to make significant improvements in function in a reasonable and predictable amount of time.     Equipment Recommendations   Tub/shower seat     Recommendations for Other Services         Precautions/Restrictions   Precautions Precautions: Fall Recall of Precautions/Restrictions: Intact Precaution/Restrictions Comments: orhtostatic+. watch BP Restrictions Weight Bearing Restrictions Per Provider Order: Yes LLE Weight Bearing Per Provider Order: Weight bearing as tolerated (in CAM) Other Position/Activity Restrictions: No formal orders placed, assume WBAT in CAM     Mobility Bed Mobility Overal bed mobility: Independent                  Transfers Overall transfer level: Needs assistance Equipment used: Rolling walker (2 wheels) Transfers: Sit to/from Stand Sit  to Stand: Contact guard assist           General transfer comment: CGA for safety      Balance Overall balance assessment: Needs assistance Sitting-balance support: No upper extremity supported, Feet supported Sitting balance-Leahy Scale: Good     Standing balance support: Bilateral upper extremity supported, Reliant on assistive device for balance Standing balance-Leahy Scale: Poor Standing balance comment: gets dizzy with prolonged standing.                           ADL either performed or assessed with clinical judgement   ADL Overall ADL's : Needs assistance/impaired Eating/Feeding: Independent;Sitting   Grooming: Set up;Sitting   Upper Body Bathing: Set up;Sitting   Lower Body Bathing: Set up;Sitting/lateral leans   Upper Body Dressing : Set up;Sitting   Lower Body Dressing: Set up;Sitting/lateral leans Lower Body Dressing Details (indicate cue type and reason): don shoes Toilet Transfer: Contact guard assist;Rolling walker (2 wheels);Stand-pivot   Toileting- Clothing Manipulation and Hygiene: Contact guard assist;Sit to/from stand       Functional mobility during ADLs: Contact guard assist;Rolling walker (2 wheels) General ADL Comments: Pt able to take steps near bedside with CGA, limited tolerance for OOB given orthostatic+ BP. not able to perform standing ADLs with limitations. Edcuated pt on doff/donning of L CAM boot, she demonstrated via backwards chaining ability to don boot     Vision Baseline Vision/History: 1 Wears glasses Vision Assessment?: No apparent visual deficits     Perception         Praxis  Pertinent Vitals/Pain Pain Assessment Pain Assessment: 0-10 Pain Score: 8  Pain Location: L foot Pain Descriptors / Indicators: Guarding, Aching, Discomfort, Grimacing, Sore Pain Intervention(s): Monitored during session, RN gave pain meds during session, Repositioned     Extremity/Trunk Assessment Upper Extremity  Assessment Upper Extremity Assessment: Overall WFL for tasks assessed   Lower Extremity Assessment Lower Extremity Assessment: Defer to PT evaluation       Communication Communication Communication: No apparent difficulties   Cognition Arousal: Alert Behavior During Therapy: WFL for tasks assessed/performed Cognition: No apparent impairments                               Following commands: Intact       Cueing  General Comments   Cueing Techniques: Verbal cues  see note for orthostatics.   Exercises Other Exercises Other Exercises: counter pressure exercise education.   Shoulder Instructions      Home Living Family/patient expects to be discharged to:: Private residence Living Arrangements: Spouse/significant other Available Help at Discharge: Family;Available 24 hours/day Type of Home: House Home Access: Ramped entrance     Home Layout: One level (1 step to den)     Bathroom Shower/Tub: Tub/shower unit;Walk-in shower   Bathroom Toilet: Standard     Home Equipment: Cane - quad;Grab bars - toilet;Grab bars - tub/shower;Rollator (4 wheels)   Additional Comments: 3 falls PTA from syncope, pt husband had open heart sx 3 weeks ago not able to physically assist.      Prior Functioning/Environment Prior Level of Function : Independent/Modified Independent;Driving;History of Falls (last six months)             Mobility Comments: mod i using Rollator ADLs Comments: ind    OT Problem List: Pain;Impaired balance (sitting and/or standing);Decreased activity tolerance   OT Treatment/Interventions: Self-care/ADL training;Therapeutic exercise;Patient/family education;Balance training;Therapeutic activities;DME and/or AE instruction      OT Goals(Current goals can be found in the care plan section)   Acute Rehab OT Goals Patient Stated Goal: to get better OT Goal Formulation: With patient Time For Goal Achievement: 08/08/24 Potential to  Achieve Goals: Good ADL Goals Pt Will Perform Grooming: with modified independence;standing Pt Will Perform Lower Body Bathing: with modified independence;sit to/from stand Pt Will Perform Lower Body Dressing: with modified independence;sitting/lateral leans;sit to/from stand Pt Will Transfer to Toilet: with modified independence;ambulating Pt Will Perform Tub/Shower Transfer: with modified independence;Shower transfer;shower seat   OT Frequency:  Min 2X/week    Co-evaluation              AM-PAC OT 6 Clicks Daily Activity     Outcome Measure Help from another person eating meals?: None Help from another person taking care of personal grooming?: A Little Help from another person toileting, which includes using toliet, bedpan, or urinal?: A Little Help from another person bathing (including washing, rinsing, drying)?: A Little Help from another person to put on and taking off regular upper body clothing?: A Little Help from another person to put on and taking off regular lower body clothing?: A Little 6 Click Score: 19   End of Session Equipment Utilized During Treatment: Gait belt;Rolling walker (2 wheels) Nurse Communication: Mobility status  Activity Tolerance: Patient tolerated treatment well Patient left: in bed;with call bell/phone within reach;with bed alarm set  OT Visit Diagnosis: Unsteadiness on feet (R26.81);Repeated falls (R29.6);History of falling (Z91.81);Dizziness and giddiness (R42);Pain Pain - Right/Left: Left Pain - part of body: Ankle  and joints of foot                Time: 9045-8977 OT Time Calculation (min): 28 min Charges:  OT General Charges $OT Visit: 1 Visit OT Evaluation $OT Eval Low Complexity: 1 Low OT Treatments $Self Care/Home Management : 8-22 mins  07/25/2024  AB, OTR/L  Acute Rehabilitation Services  Office: [phone]   Curtistine JONETTA Das 07/25/2024, 11:12 AM

## 2024-07-25 NOTE — Progress Notes (Signed)
 PROGRESS NOTE    [name] [name]  FMW:[id number] DOB: 06-Mar-1961 DOA: 07/23/2024 PCP: Arlyss Eugene BRAVO, NP  Outpatient Specialists:     Brief Narrative:  [name] [name] is a 63 year old female past medical history significant for recurrent syncope, congestive heart failure, type 1 diabetes mellitus, DKA, B12 deficiency, hyperlipidemia, hypertension.  Chronic kidney disease and peripheral neuropathy.  [name] [name] was admitted with syncope.  According to the [name] [name], she reported that blood pressure dropped significantly prior to syncope.  [name] [name] also reported orthostatic symptoms.  [name] [name] and [name] [name]'s husband have asked for cardiology input.  Pursue echocardiograms.  Complete workup for syncope.  Likely telemetry monitoring on discharge.  Orthostatic vital signs.  07/24/2024: [name] [name] seen.  [name] [name]'s husband was also on the phone during the consultation.  Recent UTI secondary to E Coli.  Will check troponin.  07/25/2024: [name] [name] seen.  Cardiology input is appreciated.  [name] [name] remains orthostatic.  Will consult orthopedic surgery for the left fibula fracture.  PT OT input.  Assessment & Plan:   Principal Problem:   Syncope Active Problems:   Hypothyroidism   Nausea & vomiting   ARF (acute renal failure) ([name])   Diabetes mellitus type 1 ([name])   Closed left fibular fracture   Syncope: - Likely orthostatic syncope. - Reported orthostatic symptoms. - [name] [name] also reported hypotension prior to episode. - Cardiac enzymes.  Pursue echocardiogram.  Telemetry monitoring.  Orthostatic vital signs. - Await cardiology input. - Adequate hydration. - Consider midodrine .   07/25/2024: [name] [name] remains orthostatic.  Continue midodrine .  Abdominal binders.  Consult physical therapy.  Acute renal failure: - Resolved. - Likely prerenal. - Serum creatinine of 0.8.  Nausea vomiting has been having intractable nausea vomiting for many months now.  Had extensive workup including cholecystectomy sphincterotomy  in March 2025 in [facility].  Gastric emptying study was unremarkable in April 2025.  LFTs are unremarkable KUB is pending.  Advance diet as tolerated.  Urine drug screen is pending.  Protonix . 07/24/2024: Managed supportively. 07/25/2024: [name] [name] continues to report nausea, but no vomiting.  Diabetes mellitus type 1 uncontrolled with hyperglycemia [name] [name] states she has ran out of her insulin  pump supplies.  During recent admission [name] [name] was on Semglee  10 units which I have placed [name] [name] on once every 4 sliding scale for now.  Repeat metabolic panel is pending to make sure [name] [name] is not going in DKA. 07/24/2024: Optimize blood sugar management.  Close left fibula fracture: - Orthopedic team is managing.  X-ray reviewed: -Acute, minimally displaced, anatomically aligned fracture of the distal left fibular metaphyseal region. 07/25/2024: Orthopedic team consulted.  Hypothyroidism - On Synthroid .  Leukocytosis: - Resolved. WBC of 8.8.  Volume depletion: - Adequate hydration.   DVT prophylaxis: Subcutaneous Lovenox  Code Status: Full code Family Communication: Husband was on the phone during the consultation Disposition Plan: Inpatient   Consultants:  Cardiology  Procedures:  None  Antimicrobials:  None   Subjective: No new complaints.  Objective: Vitals:   07/24/24 1257 07/24/24 1655 07/25/24 0421 07/25/24 0839  BP:  (!) 126/91 (!) 168/86 131/83  Pulse: 79 84 81 91  Resp: 18  18   Temp: 98 F (36.7 C) 98.2 F (36.8 C) 98 F (36.7 C)   TempSrc: Oral Oral Oral   SpO2: 92% 98% 99% 98%  Weight: 55.3 kg     Height: 4' 11 (1.499 m)       Intake/Output Summary (Last 24 hours) at 07/25/2024 0914 Last data filed at 07/25/2024 0830 Gross per 24 hour  Intake  2298.18 ml  Output 1150 ml  Net 1148.18 ml   Filed Weights   07/23/24 2309 07/24/24 1257  Weight: 54 kg 55.3 kg    Examination:  General exam: Appears calm and comfortable  Respiratory system: Clear  to auscultation.  Cardiovascular system: S1 & S2 heard. Gastrointestinal system: Abdomen is soft and nontender. Central nervous system: Awake and alert.   Extremities: Left lower leg splint.  Data Reviewed: I have personally reviewed following labs and imaging studies  CBC: Recent Labs  Lab 07/19/24 0411 07/20/24 0420 07/24/24 0037 07/24/24 0903 07/25/24 0410  WBC 7.5 7.8 14.2* 13.3* 8.8  NEUTROABS  --  3.7 10.1* 9.9* 5.0  HGB 12.8 14.0 14.7 14.8 13.2  HCT 39.9 42.9 43.9 44.5 39.8  MCV 87.5 87.2 85.7 85.2 85.6  PLT 294 321 332 347 325   Basic Metabolic Panel: Recent Labs  Lab 07/19/24 0411 07/20/24 0420 07/24/24 0037 07/24/24 0903 07/25/24 0410  NA 136 137 136 138 134*  K 3.8 4.1 3.6 4.0 4.1  CL 104 104 94* 97* 100  CO2 21* 22 28 28 26   GLUCOSE 92 98 200* 180* 111*  BUN 13 9 29* 23 12  CREATININE 0.84 0.70 1.77* 0.96 0.80  CALCIUM  8.2* 8.9 9.1 8.8* 8.8*  MG  --   --   --   --  1.8  PHOS  --   --   --   --  2.9   GFR: Estimated Creatinine Clearance: 56 mL/min (by C-G formula based on SCr of 0.8 mg/dL). Liver Function Tests: Recent Labs  Lab 07/24/24 0037 07/24/24 0903 07/25/24 0410  AST 17 23  --   ALT 13 15  --   ALKPHOS 55 64  --   BILITOT 0.7 1.2  --   PROT 6.7 6.7  --   ALBUMIN 3.9 3.7 3.2*   Recent Labs  Lab 07/24/24 0037  LIPASE 29   No results for input(s): AMMONIA in the last 168 hours. Coagulation Profile: No results for input(s): INR, PROTIME in the last 168 hours. Cardiac Enzymes: No results for input(s): CKTOTAL, CKMB, CKMBINDEX, TROPONINI in the last 168 hours. BNP (last 3 results) No results for input(s): PROBNP in the last 8760 hours. HbA1C: No results for input(s): HGBA1C in the last 72 hours. CBG: Recent Labs  Lab 07/24/24 1651 07/24/24 1951 07/24/24 2347 07/25/24 0420 07/25/24 0826  GLUCAP 80 99 143* 155* 119*   Lipid Profile: No results for input(s): CHOL, HDL, LDLCALC, TRIG, CHOLHDL,  LDLDIRECT in the last 72 hours. Thyroid  Function Tests: Recent Labs    07/24/24 0903  TSH 29.976*   Anemia Panel: No results for input(s): VITAMINB12, FOLATE, FERRITIN, TIBC, IRON, RETICCTPCT in the last 72 hours. Urine analysis:    Component Value Date/Time   COLORURINE YELLOW 07/24/2024 1225   APPEARANCEUR HAZY (A) 07/24/2024 1225   LABSPEC 1.011 07/24/2024 1225   PHURINE 8.0 07/24/2024 1225   GLUCOSEU 50 (A) 07/24/2024 1225   HGBUR NEGATIVE 07/24/2024 1225   BILIRUBINUR NEGATIVE 07/24/2024 1225   BILIRUBINUR negative 03/09/2021 1123   KETONESUR 5 (A) 07/24/2024 1225   PROTEINUR 30 (A) 07/24/2024 1225   UROBILINOGEN 0.2 03/09/2021 1123   UROBILINOGEN 0.2 10/28/2013 1723   NITRITE NEGATIVE 07/24/2024 1225   LEUKOCYTESUR NEGATIVE 07/24/2024 1225   Sepsis Labs: @LABRCNTIP (procalcitonin:4,lacticidven:4)  ) Recent Results (from the past 240 hours)  Urine Culture     Status: Abnormal   Collection Time: 07/18/24  6:30 AM   Specimen: Urine, Random  Result Value Ref Range Status   Specimen Description   Final    URINE, RANDOM Performed at [facility], [address] [place], [place] [address]    Special Requests   Final    NONE Performed at [facility], [address], [place], [place] [address]    Culture >=100,000 COLONIES/mL ESCHERICHIA COLI (A)  Final   Report Status 07/20/2024 FINAL  Final   Organism ID, Bacteria ESCHERICHIA COLI (A)  Final      Susceptibility   Escherichia coli - MIC*    AMPICILLIN  16 INTERMEDIATE Intermediate     CEFAZOLIN  <=4 SENSITIVE Sensitive     CEFEPIME  <=0.12 SENSITIVE Sensitive     CEFTRIAXONE  <=0.25 SENSITIVE Sensitive     CIPROFLOXACIN  >=4 RESISTANT Resistant     GENTAMICIN <=1 SENSITIVE Sensitive     IMIPENEM <=0.25 SENSITIVE Sensitive     NITROFURANTOIN  <=16 SENSITIVE Sensitive     TRIMETH /SULFA  <=20 SENSITIVE Sensitive     AMPICILLIN /SULBACTAM 8 SENSITIVE Sensitive      PIP/TAZO 8 SENSITIVE Sensitive ug/mL    * >=100,000 COLONIES/mL ESCHERICHIA COLI         Radiology Studies: DG Abd 1 View Result Date: 07/24/2024 EXAM: 1 VIEW XRAY OF THE ABDOMEN 07/24/2024 07:25:00 AM COMPARISON: 07/24/2024 CLINICAL HISTORY: [id number] Nausea \\T \ vomiting [id number]. Nausea and vomiting. Pt notes discomfort in abdomen. FINDINGS: BOWEL: Mild amount of retained stool noted within the proximal ascending colon. Gas is seen throughout the remainder of the colon and rectum. No pathologic dilatation of the small bowel loops. SOFT TISSUES: No supine evidence for pneumoperitoneum. BONES: No acute osseous abnormalities. IMPRESSION: 1. No bowel obstruction. 2. Mild amount of retained stool in the proximal ascending colon. Electronically signed by: Waddell Calk MD 07/24/2024 07:49 AM EDT RP Workstation: [id number]   DG Ankle 2 Views Left Result Date: 07/24/2024 CLINICAL DATA:  Fall EXAM: LEFT ANKLE - 2 VIEW COMPARISON:  None Available. FINDINGS: Acute, oblique, minimally displaced, anatomically aligned fracture of the distal left fibular metaphyseal region extending to the level of the tibial plafond is noted. Normal overall alignment. Ankle mortise appears preserved. No ankle effusion. Extensive soft tissue swelling superficial to the lateral malleolus. Vascular calcifications noted. IMPRESSION: 1. Acute, minimally displaced, anatomically aligned fracture of the distal left fibular metaphyseal region. Electronically Signed   By: Dorethia Molt M.D.   On: 07/24/2024 03:15   DG Knee 1-2 Views Left Result Date: 07/24/2024 CLINICAL DATA:  Fall EXAM: LEFT KNEE - 1-2 VIEW COMPARISON:  None Available. FINDINGS: No evidence of fracture, dislocation, or joint effusion. No evidence of arthropathy or other focal bone abnormality. Vascular calcifications noted. IMPRESSION: Negative. Electronically Signed   By: Dorethia Molt M.D.   On: 07/24/2024 03:14   DG Pelvis 1-2 Views Result Date: 07/24/2024 CLINICAL  DATA:  Fall EXAM: PELVIS - 1-2 VIEW COMPARISON:  None Available. FINDINGS: There is no evidence of pelvic fracture or diastasis. No pelvic bone lesions are seen. IMPRESSION: Negative. Electronically Signed   By: Dorethia Molt M.D.   On: 07/24/2024 03:14   CT Thoracic Spine Wo Contrast Result Date: 07/24/2024 CLINICAL DATA:  Multiple falls with back pain, initial encounter EXAM: CT THORACIC AND LUMBAR SPINE WITHOUT CONTRAST TECHNIQUE: Multidetector CT imaging of the thoracic and lumbar spine was performed without contrast. Multiplanar CT image reconstructions were also generated. RADIATION DOSE REDUCTION: This exam was performed according to the departmental dose-optimization program which includes automated exposure control, adjustment of the mA and/or kV according  to [name] [name] size and/or use of iterative reconstruction technique. COMPARISON:  07/18/2024 FINDINGS: CT THORACIC SPINE FINDINGS Alignment: Normal. Vertebrae: 12 thoracic vertebra are noted. Vertebral body height is well maintained. No compression deformity is seen. Minimal osteophytic changes are seen. No acute fracture is noted. Paraspinal and other soft tissues: No significant soft tissue abnormality is noted. The visualize ribcage is within normal limits. Disc levels: No specific disc pathology is noted. CT LUMBAR SPINE FINDINGS Segmentation: 5 lumbar type vertebrae. Alignment: Alignment is within normal limits. Vertebrae: Vertebral body height is well maintained. No compression deformity is seen. Very mild osteophytic changes are seen. No acute fracture or acute facet abnormality is noted. Paraspinal and other soft tissues: Surrounding soft tissue structures show no acute abnormality. Disc levels: No specific disc pathology is noted. IMPRESSION: CT THORACIC SPINE IMPRESSION Mild degenerative changes without acute abnormality. CT LUMBAR SPINE IMPRESSION Mild degenerative change without acute abnormality. Electronically Signed   By: Oneil Devonshire M.D.    On: 07/24/2024 00:34   CT Lumbar Spine Wo Contrast Result Date: 07/24/2024 CLINICAL DATA:  Multiple falls with back pain, initial encounter EXAM: CT THORACIC AND LUMBAR SPINE WITHOUT CONTRAST TECHNIQUE: Multidetector CT imaging of the thoracic and lumbar spine was performed without contrast. Multiplanar CT image reconstructions were also generated. RADIATION DOSE REDUCTION: This exam was performed according to the departmental dose-optimization program which includes automated exposure control, adjustment of the mA and/or kV according to [name] [name] size and/or use of iterative reconstruction technique. COMPARISON:  07/18/2024 FINDINGS: CT THORACIC SPINE FINDINGS Alignment: Normal. Vertebrae: 12 thoracic vertebra are noted. Vertebral body height is well maintained. No compression deformity is seen. Minimal osteophytic changes are seen. No acute fracture is noted. Paraspinal and other soft tissues: No significant soft tissue abnormality is noted. The visualize ribcage is within normal limits. Disc levels: No specific disc pathology is noted. CT LUMBAR SPINE FINDINGS Segmentation: 5 lumbar type vertebrae. Alignment: Alignment is within normal limits. Vertebrae: Vertebral body height is well maintained. No compression deformity is seen. Very mild osteophytic changes are seen. No acute fracture or acute facet abnormality is noted. Paraspinal and other soft tissues: Surrounding soft tissue structures show no acute abnormality. Disc levels: No specific disc pathology is noted. IMPRESSION: CT THORACIC SPINE IMPRESSION Mild degenerative changes without acute abnormality. CT LUMBAR SPINE IMPRESSION Mild degenerative change without acute abnormality. Electronically Signed   By: Oneil Devonshire M.D.   On: 07/24/2024 00:34   CT Head Wo Contrast Result Date: 07/24/2024 CLINICAL DATA:  History of recent falls with dizziness and neck pain, initial encounter EXAM: CT HEAD WITHOUT CONTRAST CT CERVICAL SPINE WITHOUT CONTRAST  TECHNIQUE: Multidetector CT imaging of the head and cervical spine was performed following the standard protocol without intravenous contrast. Multiplanar CT image reconstructions of the cervical spine were also generated. RADIATION DOSE REDUCTION: This exam was performed according to the departmental dose-optimization program which includes automated exposure control, adjustment of the mA and/or kV according to [name] [name] size and/or use of iterative reconstruction technique. COMPARISON:  None Available. FINDINGS: CT HEAD FINDINGS Brain: No evidence of acute infarction, hemorrhage, hydrocephalus, extra-axial collection or mass lesion/mass effect. Vascular: No hyperdense vessel or unexpected calcification. Skull: Normal. Negative for fracture or focal lesion. Sinuses/Orbits: No acute finding. Other: None. CT CERVICAL SPINE FINDINGS Alignment: Mild straightening of the normal cervical lordosis. Skull base and vertebrae: 7 cervical segments are well visualized. Vertebral body height is well maintained. Mild osteophytic changes are noted throughout the cervical spine. Mild central disc bulging is  noted at C3-4 and mild left paracentral disc bulging at C5-6. No acute fracture or acute facet abnormality is noted. The odontoid is within normal limits. Soft tissues and spinal canal: Surrounding soft tissue structures are within normal limits. Upper chest: Visualized lung apices are unremarkable. Other: None IMPRESSION: CT of the head: No acute intracranial abnormality noted. CT of the cervical spine: Multilevel degenerative changes as described no acute abnormality noted. Electronically Signed   By: Oneil Devonshire M.D.   On: 07/24/2024 00:26   CT Cervical Spine Wo Contrast Result Date: 07/24/2024 CLINICAL DATA:  History of recent falls with dizziness and neck pain, initial encounter EXAM: CT HEAD WITHOUT CONTRAST CT CERVICAL SPINE WITHOUT CONTRAST TECHNIQUE: Multidetector CT imaging of the head and cervical spine was  performed following the standard protocol without intravenous contrast. Multiplanar CT image reconstructions of the cervical spine were also generated. RADIATION DOSE REDUCTION: This exam was performed according to the departmental dose-optimization program which includes automated exposure control, adjustment of the mA and/or kV according to [name] [name] size and/or use of iterative reconstruction technique. COMPARISON:  None Available. FINDINGS: CT HEAD FINDINGS Brain: No evidence of acute infarction, hemorrhage, hydrocephalus, extra-axial collection or mass lesion/mass effect. Vascular: No hyperdense vessel or unexpected calcification. Skull: Normal. Negative for fracture or focal lesion. Sinuses/Orbits: No acute finding. Other: None. CT CERVICAL SPINE FINDINGS Alignment: Mild straightening of the normal cervical lordosis. Skull base and vertebrae: 7 cervical segments are well visualized. Vertebral body height is well maintained. Mild osteophytic changes are noted throughout the cervical spine. Mild central disc bulging is noted at C3-4 and mild left paracentral disc bulging at C5-6. No acute fracture or acute facet abnormality is noted. The odontoid is within normal limits. Soft tissues and spinal canal: Surrounding soft tissue structures are within normal limits. Upper chest: Visualized lung apices are unremarkable. Other: None IMPRESSION: CT of the head: No acute intracranial abnormality noted. CT of the cervical spine: Multilevel degenerative changes as described no acute abnormality noted. Electronically Signed   By: Oneil Devonshire M.D.   On: 07/24/2024 00:26        Scheduled Meds:  aspirin  EC  81 mg Oral Daily   enoxaparin  (LOVENOX ) injection  40 mg Subcutaneous Q24H   gabapentin   100 mg Oral QHS   insulin  aspart  0-9 Units Subcutaneous Q4H   insulin  glargine-yfgn  10 Units Subcutaneous Daily   levothyroxine   150 mcg Oral QAC breakfast   midodrine   2.5 mg Oral TID WC   pantoprazole  (PROTONIX ) IV  40  mg Intravenous Q24H   Continuous Infusions:     LOS: 1 day    Time spent: 35 minutes.    Leatrice Chapel, MD  [facility] Pager #: [phone] 7PM-7AM contact night coverage as above

## 2024-07-26 DIAGNOSIS — R55 Syncope and collapse: Secondary | ICD-10-CM | POA: Diagnosis not present

## 2024-07-26 LAB — GLUCOSE, CAPILLARY
Glucose-Capillary: 104 mg/dL — ABNORMAL HIGH (ref 70–99)
Glucose-Capillary: 111 mg/dL — ABNORMAL HIGH (ref 70–99)
Glucose-Capillary: 134 mg/dL — ABNORMAL HIGH (ref 70–99)
Glucose-Capillary: 140 mg/dL — ABNORMAL HIGH (ref 70–99)
Glucose-Capillary: 146 mg/dL — ABNORMAL HIGH (ref 70–99)
Glucose-Capillary: 167 mg/dL — ABNORMAL HIGH (ref 70–99)
Glucose-Capillary: 59 mg/dL — ABNORMAL LOW (ref 70–99)

## 2024-07-26 MED ORDER — HYDROMORPHONE HCL 2 MG PO TABS
1.0000 mg | ORAL_TABLET | Freq: Four times a day (QID) | ORAL | Status: DC | PRN
  Administered 2024-07-26 – 2024-07-27 (×2): 1 mg via ORAL
  Filled 2024-07-26 (×2): qty 1

## 2024-07-26 MED ORDER — MIDODRINE HCL 5 MG PO TABS
5.0000 mg | ORAL_TABLET | Freq: Three times a day (TID) | ORAL | Status: DC
  Administered 2024-07-27 (×2): 5 mg via ORAL
  Filled 2024-07-26 (×2): qty 1

## 2024-07-26 NOTE — Plan of Care (Signed)
  Problem: Education: Goal: Individualized Educational Video(s) Outcome: Progressing   Problem: Coping: Goal: Ability to adjust to condition or change in health will improve Outcome: Progressing   Problem: Fluid Volume: Goal: Ability to maintain a balanced intake and output will improve Outcome: Progressing   Problem: Health Behavior/Discharge Planning: Goal: Ability to identify and utilize available resources and services will improve Outcome: Progressing Goal: Ability to manage health-related needs will improve Outcome: Progressing   Problem: Health Behavior/Discharge Planning: Goal: Ability to identify and utilize available resources and services will improve Outcome: Progressing Goal: Ability to manage health-related needs will improve Outcome: Progressing   Problem: Metabolic: Goal: Ability to maintain appropriate glucose levels will improve Outcome: Progressing   Problem: Nutritional: Goal: Maintenance of adequate nutrition will improve Outcome: Progressing Goal: Progress toward achieving an optimal weight will improve Outcome: Progressing   Problem: Skin Integrity: Goal: Risk for impaired skin integrity will decrease Outcome: Progressing   Problem: Tissue Perfusion: Goal: Adequacy of tissue perfusion will improve Outcome: Progressing   Problem: Education: Goal: Knowledge of General Education information will improve Description: Including pain rating scale, medication(s)/side effects and non-pharmacologic comfort measures Outcome: Progressing   Problem: Health Behavior/Discharge Planning: Goal: Ability to manage health-related needs will improve Outcome: Progressing   Problem: Activity: Goal: Risk for activity intolerance will decrease Outcome: Progressing   Problem: Nutrition: Goal: Adequate nutrition will be maintained Outcome: Progressing   Problem: Coping: Goal: Level of anxiety will decrease Outcome: Progressing

## 2024-07-26 NOTE — Progress Notes (Signed)
 PROGRESS NOTE    [name] [name]  FMW:[id number] DOB: 08-22-1961 DOA: 07/23/2024 PCP: Arlyss Eugene BRAVO, NP  Outpatient Specialists:     Brief Narrative:  Patient is a 63 year old female past medical history significant for recurrent syncope, congestive heart failure, type 1 diabetes mellitus, DKA, B12 deficiency, hyperlipidemia, hypertension.  Chronic kidney disease and peripheral neuropathy.  Patient was admitted with syncope.  According to the patient, she reported that blood pressure dropped significantly prior to syncope.  Patient also reported orthostatic symptoms.  Patient and patient's husband have asked for cardiology input.  Pursue echocardiograms.  Complete workup for syncope.  Likely telemetry monitoring on discharge.  Orthostatic vital signs.  07/24/2024: Patient seen.  Patient's husband was also on the phone during the consultation.  Recent UTI secondary to E Coli.  Will check troponin.  07/25/2024: Patient seen.  Cardiology input is appreciated.  Patient remains orthostatic.  Will consult orthopedic surgery for the left fibula fracture.  PT OT input.  07/26/2024: Patient remains orthostatic.  Patient is not keen on being discharged back home today.  Likely discharge home tomorrow.  Assessment & Plan:   Principal Problem:   Syncope Active Problems:   Hypothyroidism   Nausea & vomiting   ARF (acute renal failure) ([name])   Diabetes mellitus type 1 ([name])   Closed left fibular fracture   Syncope: - Likely orthostatic syncope. - Reported orthostatic symptoms. - Patient also reported hypotension prior to episode. - Cardiac enzymes.  Pursue echocardiogram.  Telemetry monitoring.  Orthostatic vital signs. - Await cardiology input. - Adequate hydration. - Consider midodrine .   07/25/2024: Patient remains orthostatic.  Continue midodrine .  Abdominal binders.  Consult physical therapy. 07/26/2024: Patient remains orthostatic.  Acute renal failure: - Resolved. - Likely  prerenal. - Serum creatinine of 0.8.  Nausea vomiting has been having intractable nausea vomiting for many months now.  Had extensive workup including cholecystectomy sphincterotomy in March 2025 in [facility].  Gastric emptying study was unremarkable in April 2025.  LFTs are unremarkable KUB is pending.  Advance diet as tolerated.  Urine drug screen is pending.  Protonix . 07/24/2024: Managed supportively. 07/25/2024: Patient continues to report nausea, but no vomiting. 07/26/2024: Improving  Diabetes mellitus type 1 uncontrolled with hyperglycemia patient states she has ran out of her insulin  pump supplies.  During recent admission patient was on Semglee  10 units which I have placed patient on once every 4 sliding scale for now.  Repeat metabolic panel is pending to make sure patient is not going in DKA. 07/24/2024: Optimize blood sugar management.  Close left fibula fracture: - Orthopedic team is managing.  X-ray reviewed: -Acute, minimally displaced, anatomically aligned fracture of the distal left fibular metaphyseal region. 07/25/2024: Orthopedic team consulted.  Hypothyroidism - On Synthroid .  Leukocytosis: - Resolved. WBC of 8.8.  Volume depletion: - Adequate hydration.   DVT prophylaxis: Subcutaneous Lovenox  Code Status: Full code Family Communication: Husband was on the phone during the consultation Disposition Plan: Inpatient   Consultants:  Cardiology  Procedures:  None  Antimicrobials:  None   Subjective: No new complaints.  Objective: Vitals:   07/26/24 0827 07/26/24 1028 07/26/24 1253 07/26/24 1254  BP: 97/69 117/79 (!) 78/64 94/69  Pulse: 82 78 86   Resp: 18     Temp: 98.3 F (36.8 C)     TempSrc:      SpO2: 98%     Weight:      Height:        Intake/Output Summary (Last 24  hours) at 07/26/2024 1914 Last data filed at 07/26/2024 1757 Gross per 24 hour  Intake 2070 ml  Output 0 ml  Net 2070 ml   Filed Weights   07/23/24 2309 07/24/24  1257  Weight: 54 kg 55.3 kg    Examination:  General exam: Appears calm and comfortable  Respiratory system: Clear to auscultation.  Cardiovascular system: S1 & S2 heard. Gastrointestinal system: Abdomen is soft and nontender. Central nervous system: Awake and alert.   Extremities: Left lower leg splint.  Data Reviewed: I have personally reviewed following labs and imaging studies  CBC: Recent Labs  Lab 07/20/24 0420 07/24/24 0037 07/24/24 0903 07/25/24 0410  WBC 7.8 14.2* 13.3* 8.8  NEUTROABS 3.7 10.1* 9.9* 5.0  HGB 14.0 14.7 14.8 13.2  HCT 42.9 43.9 44.5 39.8  MCV 87.2 85.7 85.2 85.6  PLT 321 332 347 325   Basic Metabolic Panel: Recent Labs  Lab 07/20/24 0420 07/24/24 0037 07/24/24 0903 07/25/24 0410  NA 137 136 138 134*  K 4.1 3.6 4.0 4.1  CL 104 94* 97* 100  CO2 22 28 28 26   GLUCOSE 98 200* 180* 111*  BUN 9 29* 23 12  CREATININE 0.70 1.77* 0.96 0.80  CALCIUM  8.9 9.1 8.8* 8.8*  MG  --   --   --  1.8  PHOS  --   --   --  2.9   GFR: Estimated Creatinine Clearance: 56 mL/min (by C-G formula based on SCr of 0.8 mg/dL). Liver Function Tests: Recent Labs  Lab 07/24/24 0037 07/24/24 0903 07/25/24 0410  AST 17 23  --   ALT 13 15  --   ALKPHOS 55 64  --   BILITOT 0.7 1.2  --   PROT 6.7 6.7  --   ALBUMIN 3.9 3.7 3.2*   Recent Labs  Lab 07/24/24 0037  LIPASE 29   No results for input(s): AMMONIA in the last 168 hours. Coagulation Profile: No results for input(s): INR, PROTIME in the last 168 hours. Cardiac Enzymes: No results for input(s): CKTOTAL, CKMB, CKMBINDEX, TROPONINI in the last 168 hours. BNP (last 3 results) No results for input(s): PROBNP in the last 8760 hours. HbA1C: No results for input(s): HGBA1C in the last 72 hours. CBG: Recent Labs  Lab 07/26/24 0118 07/26/24 0406 07/26/24 0756 07/26/24 1144 07/26/24 1633  GLUCAP 134* 111* 167* 104* 140*   Lipid Profile: No results for input(s): CHOL, HDL,  LDLCALC, TRIG, CHOLHDL, LDLDIRECT in the last 72 hours. Thyroid  Function Tests: Recent Labs    07/24/24 0903  TSH 29.976*   Anemia Panel: No results for input(s): VITAMINB12, FOLATE, FERRITIN, TIBC, IRON, RETICCTPCT in the last 72 hours. Urine analysis:    Component Value Date/Time   COLORURINE YELLOW 07/24/2024 1225   APPEARANCEUR HAZY (A) 07/24/2024 1225   LABSPEC 1.011 07/24/2024 1225   PHURINE 8.0 07/24/2024 1225   GLUCOSEU 50 (A) 07/24/2024 1225   HGBUR NEGATIVE 07/24/2024 1225   BILIRUBINUR NEGATIVE 07/24/2024 1225   BILIRUBINUR negative 03/09/2021 1123   KETONESUR 5 (A) 07/24/2024 1225   PROTEINUR 30 (A) 07/24/2024 1225   UROBILINOGEN 0.2 03/09/2021 1123   UROBILINOGEN 0.2 10/28/2013 1723   NITRITE NEGATIVE 07/24/2024 1225   LEUKOCYTESUR NEGATIVE 07/24/2024 1225   Sepsis Labs: @LABRCNTIP (procalcitonin:4,lacticidven:4)  ) Recent Results (from the past 240 hours)  Urine Culture     Status: Abnormal   Collection Time: 07/18/24  6:30 AM   Specimen: Urine, Random  Result Value Ref Range Status   Specimen Description  Final    URINE, RANDOM Performed at [facility], [address], [place], [place] [address]    Special Requests   Final    NONE Performed at [facility], [address] [place], [place] [address]    Culture >=100,000 COLONIES/mL ESCHERICHIA COLI (A)  Final   Report Status 07/20/2024 FINAL  Final   Organism ID, Bacteria ESCHERICHIA COLI (A)  Final      Susceptibility   Escherichia coli - MIC*    AMPICILLIN  16 INTERMEDIATE Intermediate     CEFAZOLIN  <=4 SENSITIVE Sensitive     CEFEPIME  <=0.12 SENSITIVE Sensitive     CEFTRIAXONE  <=0.25 SENSITIVE Sensitive     CIPROFLOXACIN  >=4 RESISTANT Resistant     GENTAMICIN <=1 SENSITIVE Sensitive     IMIPENEM <=0.25 SENSITIVE Sensitive     NITROFURANTOIN  <=16 SENSITIVE Sensitive     TRIMETH /SULFA  <=20 SENSITIVE Sensitive     AMPICILLIN /SULBACTAM 8  SENSITIVE Sensitive     PIP/TAZO 8 SENSITIVE Sensitive ug/mL    * >=100,000 COLONIES/mL ESCHERICHIA COLI  Culture, OB Urine     Status: Abnormal (Preliminary result)   Collection Time: 07/24/24 12:25 PM   Specimen: Urine, Random  Result Value Ref Range Status   Specimen Description URINE, RANDOM  Final   Special Requests NONE  Final   Culture (A)  Final    10,000 COLONIES/mL STAPHYLOCOCCUS HAEMOLYTICUS SUSCEPTIBILITIES TO FOLLOW NO GROUP B STREP (S.AGALACTIAE) ISOLATED Performed at [facility], [address] [place], [place] [address]    Report Status PENDING  Incomplete         Radiology Studies: ECHOCARDIOGRAM COMPLETE Result Date: 07/25/2024    ECHOCARDIOGRAM REPORT   Patient Name:   [name] [name] Date of Exam: 07/25/2024 Medical Rec #:  [id number]      Height:       59.0 in Accession #:    [id number]     Weight:       121.9 lb Date of Birth:  June 14, 1961      BSA:          1.494 m Patient Age:    63 years       BP:           131/83 mmHg Patient Gender: F              HR:           81 bpm. Exam Location:  Inpatient Procedure: 2D Echo, 3D Echo, Cardiac Doppler, Color Doppler and Strain Analysis            (Both Spectral and Color Flow Doppler were utilized during            procedure). Indications:    Syncope  History:        Patient has prior history of Echocardiogram examinations, most                 recent 11/23/2021. Cardiomyopathy, Previous Myocardial                 Infarction, Signs/Symptoms:Chest Pain, Syncope and Hypotension;                 Risk Factors:Diabetes, Dyslipidemia, Hypertension, Former Smoker                 and Hypothyroidism.  Sonographer:    Logan Shove RDCS Referring Phys: 64 ARSHAD N KAKRAKANDY IMPRESSIONS  1. Left ventricular ejection fraction, by estimation, is 60 to 65%. Left ventricular  ejection fraction by 3D volume is 60 %. The left ventricle has normal function. The left ventricle has no regional wall motion abnormalities. Left ventricular diastolic   parameters are indeterminate. The average left ventricular global longitudinal strain is -20.1 %. The global longitudinal strain is normal.  2. Right ventricular systolic function is normal. The right ventricular size is normal.  3. The mitral valve is normal in structure. Trivial mitral valve regurgitation.  4. The aortic valve is tricuspid. Aortic valve regurgitation is not visualized. Comparison(s): The left ventricular function is unchanged. FINDINGS  Left Ventricle: Left ventricular ejection fraction, by estimation, is 60 to 65%. Left ventricular ejection fraction by 3D volume is 60 %. The left ventricle has normal function. The left ventricle has no regional wall motion abnormalities. The average left ventricular global longitudinal strain is -20.1 %. Strain was performed and the global longitudinal strain is normal. The left ventricular internal cavity size was normal in size. There is no left ventricular hypertrophy. Left ventricular diastolic parameters are indeterminate. Right Ventricle: The right ventricular size is normal. Right vetricular wall thickness was not assessed. Right ventricular systolic function is normal. Left Atrium: Left atrial size was normal in size. Right Atrium: Right atrial size was normal in size. Pericardium: There is no evidence of pericardial effusion. Mitral Valve: The mitral valve is normal in structure. Trivial mitral valve regurgitation. Tricuspid Valve: The tricuspid valve is normal in structure. Tricuspid valve regurgitation is trivial. Aortic Valve: The aortic valve is tricuspid. Aortic valve regurgitation is not visualized. Aortic valve mean gradient measures 4.0 mmHg. Aortic valve peak gradient measures 7.4 mmHg. Aortic valve area, by VTI measures 2.25 cm. Pulmonic Valve: The pulmonic valve was normal in structure. Pulmonic valve regurgitation is not visualized. Aorta: The aortic root and ascending aorta are structurally normal, with no evidence of dilitation.  IAS/Shunts: No atrial level shunt detected by color flow Doppler. Additional Comments: 3D was performed not requiring image post processing on an independent workstation and was normal.  LEFT VENTRICLE PLAX 2D LVIDd:         3.70 cm         Diastology LVIDs:         2.40 cm         LV e' medial:    7.07 cm/s LV PW:         0.90 cm         LV E/e' medial:  8.8 LV IVS:        0.90 cm         LV e' lateral:   6.42 cm/s LVOT diam:     2.00 cm         LV E/e' lateral: 9.7 LV SV:         57 LV SV Index:   38              2D Longitudinal LVOT Area:     3.14 cm        Strain                                2D Strain GLS   -20.1 %                                Avg:  3D Volume EF                                LV 3D EF:    Left                                             ventricul                                             ar                                             ejection                                             fraction                                             by 3D                                             volume is                                             60 %.                                 3D Volume EF:                                3D EF:        60 %                                LV EDV:       74 ml                                LV ESV:       29 ml                                LV SV:        44 ml RIGHT VENTRICLE            IVC RV Basal diam:  2.80 cm    IVC diam: 1.10 cm RV S prime:     9.14 cm/s TAPSE (M-mode): 2.0 cm  LEFT ATRIUM             Index        RIGHT ATRIUM           Index LA diam:        2.40 cm 1.61 cm/m   RA Area:     14.60 cm LA Vol (A2C):   36.8 ml 24.63 ml/m  RA Volume:   37.00 ml  24.76 ml/m LA Vol (A4C):   54.3 ml 36.34 ml/m LA Biplane Vol: 45.9 ml 30.72 ml/m  AORTIC VALVE AV Area (Vmax):    2.24 cm AV Area (Vmean):   2.14 cm AV Area (VTI):     2.25 cm AV Vmax:           136.00 cm/s AV Vmean:          91.400 cm/s AV VTI:            0.256  m AV Peak Grad:      7.4 mmHg AV Mean Grad:      4.0 mmHg LVOT Vmax:         96.90 cm/s LVOT Vmean:        62.400 cm/s LVOT VTI:          0.183 m LVOT/AV VTI ratio: 0.71  AORTA Ao Root diam: 2.70 cm Ao Asc diam:  2.70 cm MITRAL VALVE MV Area (PHT): 3.21 cm    SHUNTS MV Decel Time: 236 msec    Systemic VTI:  0.18 m MV E velocity: 62.40 cm/s  Systemic Diam: 2.00 cm MV A velocity: 78.10 cm/s MV E/A ratio:  0.80 Vina Gull MD Electronically signed by Vina Gull MD Signature Date/Time: 07/25/2024/4:52:36 PM    Final         Scheduled Meds:  aspirin  EC  81 mg Oral Daily   enoxaparin  (LOVENOX ) injection  40 mg Subcutaneous Q24H   gabapentin   100 mg Oral QHS   insulin  aspart  0-9 Units Subcutaneous Q4H   insulin  glargine-yfgn  10 Units Subcutaneous Daily   levothyroxine   150 mcg Oral QAC breakfast   [START ON 07/27/2024] midodrine   5 mg Oral TID WC   pantoprazole   40 mg Oral Daily   Continuous Infusions:     LOS: 2 days    Time spent: 35 minutes.    Leatrice Chapel, MD  [facility] Pager #: [phone] 7PM-7AM contact night coverage as above

## 2024-07-26 NOTE — Evaluation (Signed)
 Physical Therapy Evaluation Patient Details Name: [name] [name] MRN: [id number] DOB: 1961-01-26 Today's Date: 07/26/2024  History of Present Illness  63 yo female presents to [facility] on 07/23/24 for nausea, vomiting, and syncope. PMH includes DM-2, HTN, HLD, hypothyroidism, orthostatic hypotension, ischemic cardiomyopathy, hx of syncope.  Clinical Impression  Pt admitted with above diagnosis. Lives at home with husband, in a single-level home with 1 steps to enter; Prior to admission, pt was able to manage independnelty, walking with rollator; Presents to PT with orthostatic hypotension, decr activity tolerance, Pain LLE with amb;  Opted to ace wrap LLE under her boot (and she already had on knee-high ted hose on); She was able to walk in hallway with CGA progressing to supervision assist, with frequent check ins re: BP/presyncopal symptoms, which did not show up;  Pt currently with functional limitations due to the deficits listed below (see PT Problem List). Pt will benefit from skilled PT to increase their independence and safety with mobility to allow discharge to the venue listed below.       Serial BPs during standing breaks while walking the hallways with RW:  90/60  HR 75 110/91  HR 72 121/59  HR 94 115/105  HR 100       If plan is discharge home, recommend the following: A little help with walking and/or transfers;A little help with bathing/dressing/bathroom   Can travel by private Data processing manager (4 wheels) (I believe she may already have)  Recommendations for Other Services  Other (comment) (mobility team)    Functional Status Assessment Patient has had a recent decline in their functional status and demonstrates the ability to make significant improvements in function in a reasonable and predictable amount of time.     Precautions / Restrictions Precautions Precautions: Fall Recall of Precautions/Restrictions:  Intact Precaution/Restrictions Comments: orhtostatic+. watch BP Restrictions LLE Weight Bearing Per Provider Order: Weight bearing as tolerated Other Position/Activity Restrictions: No formal orders placed, assume WBAT in CAM      Mobility  Bed Mobility Overal bed mobility: Independent                  Transfers Overall transfer level: Needs assistance Equipment used: Rolling walker (2 wheels) Transfers: Sit to/from Stand Sit to Stand: Contact guard assist           General transfer comment: CGA for safety    Ambulation/Gait Ambulation/Gait assistance: Contact guard assist, Supervision Gait Distance (Feet): 120 Feet (x 4 reps) Assistive device: Rolling walker (2 wheels) Gait Pattern/deviations: Step-through pattern, Decreased stance time - left       General Gait Details: Good self-monitor for activity tolerance; also good use to help unweigh painful LLE with weight bearing; frequent checking in for presyncopal symptoms  Stairs            Wheelchair Mobility     Tilt Bed    Modified Rankin (Stroke Patients Only)       Balance     Sitting balance-Leahy Scale: Good     Standing balance support: Bilateral upper extremity supported, Reliant on assistive device for balance Standing balance-Leahy Scale: Poor Standing balance comment: gets dizzy with prolonged standing.                             Pertinent Vitals/Pain Pain Assessment Pain Assessment: 0-10 Pain Score: 7  Pain Location: L foot Pain Descriptors / Indicators: Guarding,  Aching, Discomfort, Grimacing, Sore Pain Intervention(s): Monitored during session, Other (comment) (Ace wrap applied)    Home Living Family/patient expects to be discharged to:: Private residence Living Arrangements: Spouse/significant other Available Help at Discharge: Family;Available 24 hours/day Type of Home: House Home Access: Ramped entrance       Home Layout: One level (1 step to  den) Home Equipment: Cane - quad;Grab bars - toilet;Grab bars - tub/shower;Rollator (4 wheels) Additional Comments: 3 falls PTA from syncope, pt husband had open heart sx 3 weeks ago not able to physically assist.    Prior Function Prior Level of Function : Independent/Modified Independent;Driving;History of Falls (last six months)             Mobility Comments: mod i using Rollator ADLs Comments: ind     Extremity/Trunk Assessment   Upper Extremity Assessment Upper Extremity Assessment: Defer to OT evaluation    Lower Extremity Assessment Lower Extremity Assessment: LLE deficits/detail LLE Deficits / Details: brusiing and swelling around R ankle; ace wrapped and donned aircast boot; able to accept weight onto L foot in boot, but with incr pain       Communication   Communication Communication: No apparent difficulties    Cognition Arousal: Alert Behavior During Therapy: WFL for tasks assessed/performed   PT - Cognitive impairments: No apparent impairments                         Following commands: Intact       Cueing Cueing Techniques: Verbal cues     General Comments General comments (skin integrity, edema, etc.): multiple BPs taken, tehy remained overall stable, adn pt was without pre-syncopal symptoms    Exercises     Assessment/Plan    PT Assessment Patient needs continued PT services  PT Problem List Decreased strength;Decreased activity tolerance;Decreased balance;Decreased mobility;Decreased knowledge of use of DME;Decreased safety awareness;Decreased knowledge of precautions;Pain;Cardiopulmonary status limiting activity       PT Treatment Interventions DME instruction;Gait training;Stair training;Functional mobility training;Therapeutic activities;Therapeutic exercise;Balance training;Neuromuscular re-education;Patient/family education;Manual techniques    PT Goals (Current goals can be found in the Care Plan section)  Acute Rehab PT  Goals Patient Stated Goal: less pain with walking; be able to walk and not worry about falling PT Goal Formulation: With patient Time For Goal Achievement: 08/09/24 Potential to Achieve Goals: Good    Frequency Min 2X/week     Co-evaluation               AM-PAC PT 6 Clicks Mobility  Outcome Measure Help needed turning from your back to your side while in a flat bed without using bedrails?: None Help needed moving from lying on your back to sitting on the side of a flat bed without using bedrails?: None Help needed moving to and from a bed to a chair (including a wheelchair)?: None Help needed standing up from a chair using your arms (e.g., wheelchair or bedside chair)?: None Help needed to walk in hospital room?: None Help needed climbing 3-5 steps with a railing? : A Little 6 Click Score: 23    End of Session Equipment Utilized During Treatment: Gait belt;Other (comment) (R LE with tED knee high, LLE with ace wrap and boot) Activity Tolerance: Patient tolerated treatment well Patient left: in bed;with call bell/phone within reach;with bed alarm set Nurse Communication: Mobility status PT Visit Diagnosis: Other abnormalities of gait and mobility (R26.89);Pain;Muscle weakness (generalized) (M62.81);History of falling (Z91.81) Pain - Right/Left: Left Pain - part  of body: Leg    Time: 1554-1630 PT Time Calculation (min) (ACUTE ONLY): 36 min   Charges:   PT Evaluation $PT Eval Moderate Complexity: 1 Mod PT Treatments $Gait Training: 8-22 mins PT General Charges $$ ACUTE PT VISIT: 1 Visit         Silvano Currier, PT  Acute Rehabilitation Services Office [phone] Secure Chat welcomed   Silvano VEAR Currier 07/26/2024, 5:43 PM

## 2024-07-26 NOTE — Plan of Care (Signed)

## 2024-07-27 ENCOUNTER — Inpatient Hospital Stay (INDEPENDENT_AMBULATORY_CARE_PROVIDER_SITE_OTHER)

## 2024-07-27 DIAGNOSIS — R55 Syncope and collapse: Secondary | ICD-10-CM

## 2024-07-27 LAB — GLUCOSE, CAPILLARY
Glucose-Capillary: 122 mg/dL — ABNORMAL HIGH (ref 70–99)
Glucose-Capillary: 143 mg/dL — ABNORMAL HIGH (ref 70–99)
Glucose-Capillary: 157 mg/dL — ABNORMAL HIGH (ref 70–99)
Glucose-Capillary: 198 mg/dL — ABNORMAL HIGH (ref 70–99)
Glucose-Capillary: 279 mg/dL — ABNORMAL HIGH (ref 70–99)

## 2024-07-27 LAB — CULTURE, OB URINE: Culture: 10000 — AB

## 2024-07-27 MED ORDER — PANTOPRAZOLE SODIUM 40 MG PO TBEC: 40.0000 mg | DELAYED_RELEASE_TABLET | Freq: Every day | ORAL | 1 refills | Status: DC

## 2024-07-27 MED ORDER — ORAL CARE MOUTH RINSE: 15.0000 mL | OROMUCOSAL | Status: DC | PRN

## 2024-07-27 MED ORDER — NOVOLOG FLEXPEN 100 UNIT/ML ~~LOC~~ SOPN: 10.0000 [IU] | PEN_INJECTOR | SUBCUTANEOUS | Status: DC

## 2024-07-27 MED ORDER — OMEGA-3-ACID ETHYL ESTERS 1 G PO CAPS: 2.0000 g | ORAL_CAPSULE | Freq: Every day | ORAL | Status: DC | PRN

## 2024-07-27 MED ORDER — TOUJEO SOLOSTAR 300 UNIT/ML ~~LOC~~ SOPN: 25.0000 [IU] | PEN_INJECTOR | SUBCUTANEOUS | Status: DC

## 2024-07-27 MED ORDER — MIDODRINE HCL 5 MG PO TABS: 5.0000 mg | ORAL_TABLET | Freq: Three times a day (TID) | ORAL | 0 refills | Status: AC

## 2024-07-27 NOTE — Discharge Summary (Addendum)
 Physician Discharge Summary  Patient ID: [name] [name] MRN: [id number] DOB/AGE: 1961-06-08 63 y.o.  Admit date: 07/23/2024 Discharge date: 07/27/2024  Admission Diagnoses:  Discharge Diagnoses:  Principal Problem:   Syncope Active Problems:   Hypothyroidism   Nausea & vomiting   ARF (acute renal failure) ([name])   Diabetes mellitus type 1 ([name])   Closed left fibular fracture Indeterminate diastolic dysfunction  Discharged Condition: stable  Hospital Course:  Patient is a 63 year old female past medical history significant for recurrent syncope, congestive heart failure, type 1 diabetes mellitus, DKA, B12 deficiency, hyperlipidemia, hypertension.  Chronic kidney disease and peripheral neuropathy.  Patient was admitted with syncope.  Patient reported that blood pressure dropped significantly prior to syncope.  Patient also reported orthostatic symptoms.  During the hospital stay, patient was noted to be significantly orthostatic.  Patient has been followed resuscitated.  Midodrine  has been restarted.  Support stockings and abdominal binders were also used.  PT/OT have not recommended any discharge follow-up.  Patient was seen by the cardiology team during the hospital stay.  Patient will be discharged back home today to the care of the primary care provider, cardiology and orthopedic team.  Left ankle x-ray revealed fracture of the fibula.  Patient has cam boot on.  Orthopedic team has advised outpatient follow-up.   Syncope: - Likely orthostatic syncope. - Reported orthostatic symptoms. - Patient also reported hypotension prior to episode. - Echocardiogram results documented below. - Vital signs were positive for orthostatic protein blood pressure. - Continue midodrine  on discharge. - Orthostatic precaution. - Follow-up with primary care provider and cardiology team on discharge.     Acute renal failure: - Resolved. - Likely prerenal. - Serum creatinine of 0.8.   Nausea  vomiting: - Patient denied history of gastroparesis. - Patient seems to be taking enough amount of opiates.  Opiates could be contributing to the nausea and vomiting. - Optimize opiate use.  Avoid if possible.  Has been having intractable nausea vomiting for many months now.  Had extensive workup inc   Diabetes mellitus type 1: - Uncontrolled with hyperglycemia patient states she has ran out of her insulin  pump - Resume insulin  pump on discharge.   Close left fibula fracture: -Acute, minimally displaced, anatomically aligned fracture of the distal left fibular metaphyseal region. - Orthopedic team consulted during the hospital stay. - Orthopedic team has advised outpatient orthopedic follow-up. - Patient currently has a cam boot on.   Hypothyroidism - On Synthroid .   Leukocytosis: - Resolved. WBC of 8.8.   Volume depletion: - Adequate hydration.    Consults: cardiology and orthopedic surgery  Significant Diagnostic Studies:  Echocardiogram revealed:  1. Left ventricular ejection fraction, by estimation, is 60 to 65%. Left  ventricular ejection fraction by 3D volume is 60 %. The left ventricle has  normal function. The left ventricle has no regional wall motion  abnormalities. Left ventricular diastolic   parameters are indeterminate. The average left ventricular global  longitudinal strain is -20.1 %. The global longitudinal strain is normal.   2. Right ventricular systolic function is normal. The right ventricular  size is normal.   3. The mitral valve is normal in structure. Trivial mitral valve  regurgitation.   4. The aortic valve is tricuspid. Aortic valve regurgitation is not  visualized.   Comparison(s): The left ventricular function is unchanged.    Left ankle x-ray revealed: SABRA Acute, minimally displaced, anatomically aligned fracture of the distal left fibular metaphyseal region.    Discharge Exam: Blood  pressure 105/65, pulse 85, temperature 97.8 F  (36.6 C), temperature source Oral, resp. rate 18, height 4' 11 (1.499 m), weight 55.3 kg, last menstrual period 08/11/2012, SpO2 97%.   Disposition: Discharge disposition: 01-Home or Self Care       Discharge Instructions     Diet - low sodium heart healthy   Complete by: As directed    Increase activity slowly   Complete by: As directed       Allergies as of 07/27/2024       Reactions   Codeine Hives, Anxiety   Ciprofloxacin  Diarrhea, Nausea Only, Other (See Comments)   Caused extreme nausea   Flagyl  [metronidazole ] Nausea Only, Other (See Comments)   Can tolerate ONLY VIA IV (otherwise, SEVERE NAUSEA)        Medication List     STOP taking these medications    acetaminophen  650 MG suppository Commonly known as: TYLENOL    azelastine  0.1 % nasal spray Commonly known as: ASTELIN    Baqsimi One Pack 3 MG/DOSE Powd Generic drug: Glucagon   cefadroxil  500 MG capsule Commonly known as: DURICEF   HYDROcodone -acetaminophen  5-325 MG tablet Commonly known as: NORCO/VICODIN   promethazine  25 MG suppository Commonly known as: PHENERGAN    promethazine  25 MG tablet Commonly known as: PHENERGAN    tiZANidine 4 MG tablet Commonly known as: ZANAFLEX       TAKE these medications    aspirin  EC 81 MG tablet Take 81 mg by mouth daily. Swallow whole.   Dexcom G7 Sensor Misc Inject 1 each into the skin See admin instructions. Place 1 new sensor into the skin every 10 days   gabapentin  100 MG capsule Commonly known as: NEURONTIN  Take 100 mg by mouth at bedtime.   Glucagon Emergency 1 MG Kit Inject 1 mg into the skin See admin instructions. Inject one mg into the skin see administration instructions. Follow package directions for low blood sugar.   levothyroxine  150 MCG tablet Commonly known as: SYNTHROID  Take 1 tablet (150 mcg total) by mouth daily before breakfast.   midodrine  5 MG tablet Commonly known as: PROAMATINE  Take 1 tablet (5 mg total) by mouth 3  (three) times daily with meals.   NovoLOG  FlexPen 100 UNIT/ML FlexPen Generic drug: insulin  aspart Inject 10 Units into the skin See admin instructions. Inject 10 units into the skin three times a day with meals, PLUS sliding scale- ONLY WHEN NOT USING THE OMNIPOD   omega-3 acid ethyl esters 1 g capsule Commonly known as: LOVAZA  Take 2 capsules (2 g total) by mouth daily as needed (for circulation).   Omnipod 5 G7 Pods (Gen 5) Misc Inject 1 Device into the skin every 3 (three) days.   ondansetron  4 MG tablet Commonly known as: ZOFRAN  Take 2 tablets (8 mg total) by mouth every 8 (eight) hours as needed for nausea or vomiting.   pantoprazole  40 MG tablet Commonly known as: PROTONIX  Take 1 tablet (40 mg total) by mouth daily. Start taking on: July 28, 2024 What changed:  when to take this additional instructions   Toujeo  SoloStar 300 UNIT/ML Solostar Pen Generic drug: insulin  glargine (1 Unit Dial ) Inject 25 Units into the skin See admin instructions. Inject 25 units into the skin at bedtime ONLY WHEN NOT USING THE OMNIPOD        Follow-up Information     Kit Rush, MD. Schedule an appointment as soon as possible for a visit.   Specialty: Orthopedic Surgery Why: Follow-up within 7-10 days. Contact information: [address] [place] [address] [phone]                 Time spent: 35 minutes.  SignedBETHA Leatrice LILLETTE Rosario 07/27/2024, 11:29 AM

## 2024-07-27 NOTE — Plan of Care (Signed)

## 2024-07-27 NOTE — Progress Notes (Signed)
 Physical Therapy Treatment Patient Details Name: [name] [name] MRN: [id number] DOB: 1961-12-01 Today's Date: 07/27/2024   History of Present Illness 63 yo female presents [facility] 07/[facility]24 for nausea, vomiting, and syncope. PMH includes DM-2, HTN, HLD, hypothyroidism, orthostatic hypotension, ischemic cardiomyopathy, hx of syncope.    PT Comments  Continuing work on functional mobility and activity tolerance;  Session focused on progressive amb, tolerance of upright activity, self-monitor for safety; Walked with both LEs ace wrapped to the knee, and pt was able to help with the ace wrapping; Walked comparable distance to yesterday with rollator; pt with good self-monitor and report of symptoms, sat down to rollator towards the end of our walk at her report of lightheadedness;  Overall she seems to be able to tolerate upright activity when coupled with ambulation/physical exertion; recommend consistent use of Rollator for safety and the  ability to sit down when needed   If plan is discharge home, recommend the following: A little help with walking and/or transfers;A little help with bathing/dressing/bathroom   Can travel by private vehicle        Equipment Recommendations  Rollator (4 wheels) (I believe she may already have)    Recommendations for Other Services Other (comment) (mobility team)     Precautions / Restrictions Precautions Precautions: Fall Recall of Precautions/Restrictions: Intact Precaution/Restrictions Comments: orhtostatic+. watch BP Restrictions LLE Weight Bearing Per Provider Order: Weight bearing as tolerated Other Position/Activity Restrictions: No formal orders placed, assume WBAT in CAM     Mobility  Bed Mobility Overal bed mobility: Independent                  Transfers Overall transfer level: Needs assistance Equipment used: Rollator (4 wheels) Transfers: Sit to/from Stand Sit to Stand: Contact guard assist           General transfer  comment: CGA for safety    Ambulation/Gait Ambulation/Gait assistance: Contact guard assist, Supervision Gait Distance (Feet): 300 Feet (with a few standing rest breaks) Assistive device: Rollator (4 wheels) Gait Pattern/deviations: Step-through pattern, Decreased stance time - left       General Gait Details: Good self-monitor for activity tolerance; also good use to help unweigh painful LLE with weight bearing; frequent checking in for presyncopal symptoms; bilateral ace wraps to knees   Stairs             Wheelchair Mobility     Tilt Bed    Modified Rankin (Stroke Patients Only)       Balance     Sitting balance-Leahy Scale: Good       Standing balance-Leahy Scale: Poor Standing balance comment: gets dizzy with prolonged standing, especially without activity                            Communication Communication Communication: No apparent difficulties  Cognition Arousal: Alert Behavior During Therapy: WFL for tasks assessed/performed   PT - Cognitive impairments: No apparent impairments                         Following commands: Intact      Cueing Cueing Techniques: Verbal cues  Exercises      General Comments General comments (skin integrity, edema, etc.): Used 2 acewraps  today, one on each LE, wrapped to the knees; Multiple BPs taken during walk; pt became slightly dizzy towards teh end of the walk, coupled with low BPs; see vitals flowsheets  for details      Pertinent Vitals/Pain Pain Assessment Pain Assessment: 0-10 Pain Score: 3  Pain Location: L foot Pain Descriptors / Indicators: Guarding, Aching, Discomfort, Grimacing, Sore Pain Intervention(s): Monitored during session, Premedicated before session (ace wrap applied)    Home Living                          Prior Function            PT Goals (current goals can now be found in the care plan section) Acute Rehab PT Goals Patient Stated Goal: less  pain with walking; be able to walk and not worry about falling PT Goal Formulation: With patient Time For Goal Achievement: 08/09/24 Potential to Achieve Goals: Good Progress towards PT goals: Progressing toward goals    Frequency    Min 2X/week      PT Plan      Co-evaluation              AM-PAC PT 6 Clicks Mobility   Outcome Measure  Help needed turning from your back to your side while in a flat bed without using bedrails?: None Help needed moving from lying on your back to sitting on the side of a flat bed without using bedrails?: None Help needed moving to and from a bed to a chair (including a wheelchair)?: None Help needed standing up from a chair using your arms (e.g., wheelchair or bedside chair)?: None Help needed to walk in hospital room?: None Help needed climbing 3-5 steps with a railing? : A Little 6 Click Score: 23    End of Session Equipment Utilized During Treatment: Gait belt;Other (comment) (R LE with tED knee high, LLE with ace wrap and boot) Activity Tolerance: Patient tolerated treatment well Patient left: in bed;with call bell/phone within reach;with bed alarm set Nurse Communication: Mobility status PT Visit Diagnosis: Other abnormalities of gait and mobility (R26.89);Pain;Muscle weakness (generalized) (M62.81);History of falling (Z91.81) Pain - Right/Left: Left Pain - part of body: Leg     Time: 9099-9064 PT Time Calculation (min) (ACUTE ONLY): 35 min  Charges:    $Gait Training: 23-37 mins PT General Charges $$ ACUTE PT VISIT: 1 Visit                     Silvano Currier, PT  Acute Rehabilitation Services Office [phone] Secure Chat welcomed    Silvano VEAR Currier 07/27/2024, 11:25 AM

## 2024-07-27 NOTE — TOC Transition Note (Signed)
 Transition of Care [facility]) - Discharge Note   Patient Details  Name: [name] [name] MRN: [id number] Date of Birth: [birth year]  [name] CM/SW Contact:  Tom-Johnson, Anshul Meddings Daphne, RN Phone Number: 07/27/2024, 1:39 PM   Clinical Narrative:     Patient is scheduled for discharge today.  Readmission Risk Assessment done. Hospital f/u and discharge instructions on AVS. Prescriptions sent to [facility] pharmacy and patient will receive meds prior discharge. No TOC needs or recommendations noted. Husband, [name] [name] to transport at discharge.  No further TOC needs noted.  Final next level of care: Home/Self Care Barriers to Discharge: Barriers Resolved   Patient Goals and CMS Choice Patient states their goals for this hospitalization and ongoing recovery are:: To return home CMS Medicare.gov Compare Post Acute Care list provided to:: Patient Choice offered to / list presented to : NA      Discharge Placement                Patient to be transferred to facility by: Husband Name of family member notified: [name] [name]    Discharge Plan and Services Additional resources added to the After Visit Summary for   In-house Referral: Clinical Social Work              DME Arranged: N/A DME Agency: NA         HH Agency: NA        Social Drivers of Health (SDOH) Interventions SDOH Screenings   Food Insecurity: No Food Insecurity (07/24/2024)  Housing: Low Risk  (07/24/2024)  Transportation Needs: No Transportation Needs (07/24/2024)  Utilities: Not At Risk (07/24/2024)  Depression (PHQ2-9): High Risk (01/24/2022)  Financial Resource Strain: Low Risk  (04/25/2024)   Received from [facility]  Physical Activity: Unknown (04/25/2024)   Received from [facility]  Social Connections: Moderately Isolated (07/18/2024)  Stress: No Stress Concern Present (04/25/2024)   Received from [facility]  Tobacco Use: Medium Risk (07/24/2024)     Readmission Risk Interventions     07/27/2024   12:00 PM 05/31/2024   11:30 AM 01/23/2024    1:16 PM  Readmission Risk Prevention Plan  Transportation Screening Complete Complete Complete  Medication Review [facility]) Referral to Pharmacy Complete Complete  PCP or Specialist appointment within 3-5 days of discharge Complete Complete Complete  HRI or Home Care Consult Complete Complete Complete  SW Recovery Care/Counseling Consult Complete Complete Complete  Palliative Care Screening Not Applicable Not Applicable Not Applicable  Skilled Nursing Facility Not Applicable Not Applicable Not Applicable

## 2024-07-27 NOTE — Progress Notes (Unsigned)
Enrolled for [employer] to mail a ZIO XT long term holter monitor to the patients address on file.   Dr. Rosemary Holms to read.

## 2024-07-27 NOTE — Progress Notes (Signed)
 DISCHARGE NOTE HOME Norva C Eastlick to be discharged Home per MD order. Discussed prescriptions and follow up appointments with the patient. Prescriptions given to patient; medication list explained in detail. Patient verbalized understanding.  Skin clean, dry and intact without evidence of skin break down, no evidence of skin tears noted. IV catheter discontinued intact. Site without signs and symptoms of complications. Dressing and pressure applied. Pt denies pain at the site currently. No complaints noted.  Patient free of lines, drains, and wounds.   An After Visit Summary (AVS) was printed and given to the patient. Patient escorted via wheelchair, and discharged home via private auto.  Doyal Sias, RN

## 2024-08-08 ENCOUNTER — Encounter (HOSPITAL_COMMUNITY): Payer: Self-pay

## 2024-08-08 ENCOUNTER — Other Ambulatory Visit: Payer: Self-pay

## 2024-08-08 ENCOUNTER — Emergency Department (HOSPITAL_COMMUNITY)
Admission: EM | Admit: 2024-08-08 | Discharge: 2024-08-08 | Disposition: A | Discharge status: Home / Self Care | Attending: Emergency Medicine | Admitting: Emergency Medicine

## 2024-08-08 ENCOUNTER — Emergency Department (HOSPITAL_COMMUNITY)

## 2024-08-08 DIAGNOSIS — N189 Chronic kidney disease, unspecified: Secondary | ICD-10-CM | POA: Diagnosis not present

## 2024-08-08 DIAGNOSIS — I509 Heart failure, unspecified: Secondary | ICD-10-CM | POA: Insufficient documentation

## 2024-08-08 DIAGNOSIS — Z7982 Long term (current) use of aspirin: Secondary | ICD-10-CM | POA: Insufficient documentation

## 2024-08-08 DIAGNOSIS — I130 Hypertensive heart and chronic kidney disease with heart failure and stage 1 through stage 4 chronic kidney disease, or unspecified chronic kidney disease: Secondary | ICD-10-CM | POA: Diagnosis not present

## 2024-08-08 DIAGNOSIS — R112 Nausea with vomiting, unspecified: Secondary | ICD-10-CM

## 2024-08-08 DIAGNOSIS — E039 Hypothyroidism, unspecified: Secondary | ICD-10-CM | POA: Diagnosis not present

## 2024-08-08 DIAGNOSIS — F1210 Cannabis abuse, uncomplicated: Secondary | ICD-10-CM | POA: Diagnosis not present

## 2024-08-08 DIAGNOSIS — E1165 Type 2 diabetes mellitus with hyperglycemia: Secondary | ICD-10-CM | POA: Diagnosis not present

## 2024-08-08 DIAGNOSIS — Z794 Long term (current) use of insulin: Secondary | ICD-10-CM | POA: Insufficient documentation

## 2024-08-08 DIAGNOSIS — F1110 Opioid abuse, uncomplicated: Secondary | ICD-10-CM | POA: Insufficient documentation

## 2024-08-08 DIAGNOSIS — Z79899 Other long term (current) drug therapy: Secondary | ICD-10-CM | POA: Diagnosis not present

## 2024-08-08 DIAGNOSIS — K279 Peptic ulcer, site unspecified, unspecified as acute or chronic, without hemorrhage or perforation: Secondary | ICD-10-CM | POA: Insufficient documentation

## 2024-08-08 DIAGNOSIS — E1122 Type 2 diabetes mellitus with diabetic chronic kidney disease: Secondary | ICD-10-CM | POA: Diagnosis not present

## 2024-08-08 DIAGNOSIS — D72829 Elevated white blood cell count, unspecified: Secondary | ICD-10-CM | POA: Insufficient documentation

## 2024-08-08 LAB — URINE DRUG SCREEN
Amphetamines: NEGATIVE
Barbiturates: NEGATIVE
Benzodiazepines: NEGATIVE
Cocaine: NEGATIVE
Fentanyl: POSITIVE — AB
Methadone Scn, Ur: NEGATIVE
Opiates: NEGATIVE
Tetrahydrocannabinol: POSITIVE — AB

## 2024-08-08 LAB — URINALYSIS, ROUTINE W REFLEX MICROSCOPIC
Bacteria, UA: NONE SEEN
Bilirubin Urine: NEGATIVE
Glucose, UA: >=500 mg/dL — AB
Ketones, ur: 20 mg/dL — AB
Leukocytes,Ua: NEGATIVE
Nitrite: NEGATIVE
Protein, ur: 100 mg/dL — AB
Specific Gravity, Urine: 1.02 (ref 1.005–1.030)
pH: 6 (ref 5.0–8.0)

## 2024-08-08 LAB — CBC WITH DIFFERENTIAL/PLATELET
Abs Immature Granulocytes: 0.1 K/uL — ABNORMAL HIGH (ref 0.00–0.07)
Basophils Absolute: 0 K/uL (ref 0.0–0.1)
Basophils Relative: 0 %
Eosinophils Absolute: 0 K/uL (ref 0.0–0.5)
Eosinophils Relative: 0 %
HCT: 40.5 % (ref 36.0–46.0)
Hemoglobin: 13.1 g/dL (ref 12.0–15.0)
Immature Granulocytes: 1 %
Lymphocytes Relative: 7 %
Lymphs Abs: 0.9 K/uL (ref 0.7–4.0)
MCH: 27.6 pg (ref 26.0–34.0)
MCHC: 32.3 g/dL (ref 30.0–36.0)
MCV: 85.3 fL (ref 80.0–100.0)
Monocytes Absolute: 0.4 K/uL (ref 0.1–1.0)
Monocytes Relative: 3 %
Neutro Abs: 11.3 K/uL — ABNORMAL HIGH (ref 1.7–7.7)
Neutrophils Relative %: 89 %
Platelets: 452 K/uL — ABNORMAL HIGH (ref 150–400)
RBC: 4.75 MIL/uL (ref 3.87–5.11)
RDW: 15.8 % — ABNORMAL HIGH (ref 11.5–15.5)
WBC: 12.7 K/uL — ABNORMAL HIGH (ref 4.0–10.5)
nRBC: 0 % (ref 0.0–0.2)

## 2024-08-08 LAB — COMPREHENSIVE METABOLIC PANEL WITH GFR
ALT: 11 U/L (ref 0–44)
AST: 15 U/L (ref 15–41)
Albumin: 4.4 g/dL (ref 3.5–5.0)
Alkaline Phosphatase: 93 U/L (ref 38–126)
Anion gap: 15 (ref 5–15)
BUN: 16 mg/dL (ref 8–23)
CO2: 24 mmol/L (ref 22–32)
Calcium: 9.6 mg/dL (ref 8.9–10.3)
Chloride: 99 mmol/L (ref 98–111)
Creatinine, Ser: 0.65 mg/dL (ref 0.44–1.00)
GFR, Estimated: >60 mL/min (ref >60)
Glucose, Bld: 331 mg/dL — ABNORMAL HIGH (ref 70–99)
Potassium: 3.7 mmol/L (ref 3.5–5.1)
Sodium: 138 mmol/L (ref 135–145)
Total Bilirubin: 0.5 mg/dL (ref 0.0–1.2)
Total Protein: 7.5 g/dL (ref 6.5–8.1)

## 2024-08-08 LAB — LIPASE, BLOOD: Lipase: 12 U/L (ref 11–51)

## 2024-08-08 MED ORDER — SODIUM CHLORIDE 0.9 % IV BOLUS
1000.0000 mL | Freq: Once | INTRAVENOUS | Status: AC
  Administered 2024-08-08: 1000 mL via INTRAVENOUS

## 2024-08-08 MED ORDER — IOHEXOL 300 MG/ML  SOLN
100.0000 mL | Freq: Once | INTRAMUSCULAR | Status: AC | PRN
  Administered 2024-08-08: 100 mL via INTRAVENOUS

## 2024-08-08 MED ORDER — HYDROMORPHONE HCL 1 MG/ML IJ SOLN
0.5000 mg | Freq: Once | INTRAMUSCULAR | Status: AC
  Administered 2024-08-08: 0.5 mg via INTRAVENOUS
  Filled 2024-08-08: qty 1

## 2024-08-08 MED ORDER — DROPERIDOL 2.5 MG/ML IJ SOLN
1.2500 mg | Freq: Once | INTRAMUSCULAR | Status: AC
  Administered 2024-08-08: 1.25 mg via INTRAVENOUS
  Filled 2024-08-08: qty 2

## 2024-08-08 MED ORDER — ONDANSETRON HCL 4 MG/2ML IJ SOLN
4.0000 mg | Freq: Once | INTRAMUSCULAR | Status: AC
  Administered 2024-08-08: 4 mg via INTRAVENOUS
  Filled 2024-08-08: qty 2

## 2024-08-08 MED ORDER — ONDANSETRON 4 MG PO TBDP: 4.0000 mg | ORAL_TABLET | Freq: Three times a day (TID) | ORAL | 0 refills | Status: DC | PRN

## 2024-08-08 MED ORDER — HYDROMORPHONE HCL 1 MG/ML IJ SOLN
1.0000 mg | Freq: Once | INTRAMUSCULAR | Status: AC
  Administered 2024-08-08: 1 mg via INTRAVENOUS
  Filled 2024-08-08: qty 1

## 2024-08-08 MED ORDER — ACETAMINOPHEN 500 MG PO TABS
1000.0000 mg | ORAL_TABLET | Freq: Once | ORAL | Status: AC
  Administered 2024-08-08: 1000 mg via ORAL
  Filled 2024-08-08: qty 2

## 2024-08-08 MED ORDER — FENTANYL CITRATE PF 50 MCG/ML IJ SOSY
50.0000 ug | PREFILLED_SYRINGE | Freq: Once | INTRAMUSCULAR | Status: AC
  Administered 2024-08-08: 50 ug via INTRAVENOUS
  Filled 2024-08-08: qty 1

## 2024-08-08 MED ORDER — PANTOPRAZOLE SODIUM 40 MG IV SOLR
40.0000 mg | Freq: Once | INTRAVENOUS | Status: AC
  Administered 2024-08-08: 40 mg via INTRAVENOUS
  Filled 2024-08-08: qty 10

## 2024-08-08 NOTE — ED Notes (Signed)
 Changed pt brief and draw sheet, cleaned pt with soap and water had a bm

## 2024-08-08 NOTE — ED Triage Notes (Signed)
 BIB EMS for flare up of colitis that started last night.  Patient received 100mcg fentanyl  with EMS.

## 2024-08-08 NOTE — Discharge Instructions (Addendum)
 As discussed, you will need to follow up with your GI provider and primary care provider. Seek emergency care if experiencing any new or worsening symptoms.

## 2024-08-08 NOTE — ED Provider Notes (Signed)
 [facility] EMERGENCY DEPARTMENT AT [facility] Provider Note   CSN: [id number] Arrival date & time: 08/08/24  1137     Patient presents with: Abdominal Pain   [name] [name] is a 63 y.o. female with PMHx CKD, CHF, DM, GERD, HLD, HTN, hypothyroidism, IBS, migraines who presents to ED concerned for left flank pain, nausea, vomiting, and diarrhea that started yesterday. Patient stating that she was recently admitted for same symptoms a couple of weeks ago and was healing well until yesterday. Patient denies fever, hematemesis, hematochezia.    Abdominal Pain      Prior to Admission medications   Medication Sig Start Date End Date Taking? Authorizing Provider  ondansetron  (ZOFRAN -ODT) 4 MG disintegrating tablet Take 1 tablet (4 mg total) by mouth every 8 (eight) hours as needed for nausea. 08/08/24  Yes Hoy Fraction F, PA-C  aspirin  EC 81 MG tablet Take 81 mg by mouth daily. Swallow whole.    [provider]  Continuous Glucose Sensor (DEXCOM G7 SENSOR) MISC Inject 1 each into the skin See admin instructions. Place 1 new sensor into the skin every 10 days 01/30/24   [provider]  gabapentin  (NEURONTIN ) 100 MG capsule Take 100 mg by mouth at bedtime.    [provider]  Glucagon, rDNA, (GLUCAGON EMERGENCY) 1 MG KIT Inject 1 mg into the skin See admin instructions. Inject one mg into the skin see administration instructions. Follow package directions for low blood sugar. 06/18/23   [provider]  Insulin  Disposable Pump (OMNIPOD 5 G7 PODS, GEN 5,) MISC Inject 1 Device into the skin every 3 (three) days.    [provider]  levothyroxine  (SYNTHROID ) 150 MCG tablet Take 1 tablet (150 mcg total) by mouth daily before breakfast. 06/01/24   [name], [name], MD  midodrine  (PROAMATINE ) 5 MG tablet Take 1 tablet (5 mg total) by mouth 3 (three) times daily with meals. 07/27/24 08/26/24  Rosario Leatrice FERNS, MD  NOVOLOG  FLEXPEN 100 UNIT/ML  FlexPen Inject 10 Units into the skin See admin instructions. Inject 10 units into the skin three times a day with meals, PLUS sliding scale- ONLY WHEN NOT USING THE OMNIPOD 07/27/24   Rosario Leatrice FERNS, MD  omega-3 acid ethyl esters (LOVAZA ) 1 g capsule Take 2 capsules (2 g total) by mouth daily as needed (for circulation). 07/27/24   Rosario Leatrice FERNS, MD  ondansetron  (ZOFRAN ) 4 MG tablet Take 2 tablets (8 mg total) by mouth every 8 (eight) hours as needed for nausea or vomiting. 05/31/24   [name], [name], MD  pantoprazole  (PROTONIX ) 40 MG tablet Take 1 tablet (40 mg total) by mouth daily. 07/28/24   Rosario Leatrice FERNS, MD  TOUJEO  SOLOSTAR 300 UNIT/ML Solostar Pen Inject 25 Units into the skin See admin instructions. Inject 25 units into the skin at bedtime ONLY WHEN NOT USING THE OMNIPOD 07/27/24   Rosario Leatrice FERNS, MD    Allergies: Codeine, Ciprofloxacin , and Flagyl  [metronidazole ]    Review of Systems  Gastrointestinal:  Positive for abdominal pain.    Updated Vital Signs BP 131/87   Pulse 97   Temp 98.4 F (36.9 C) (Oral)   Resp 18   Ht 4' 11 (1.499 m)   Wt 54.9 kg   LMP 08/11/2012   SpO2 95%   BMI 24.44 kg/m   Physical Exam Vitals and nursing note reviewed.  Constitutional:      General: She is in acute distress.     Appearance: She is  not ill-appearing or toxic-appearing.  HENT:     Head: Normocephalic and atraumatic.     Mouth/Throat:     Mouth: Mucous membranes are moist.     Pharynx: No posterior oropharyngeal erythema.  Eyes:     General: No scleral icterus.       Right eye: No discharge.        Left eye: No discharge.     Conjunctiva/sclera: Conjunctivae normal.  Cardiovascular:     Rate and Rhythm: Normal rate and regular rhythm.     Pulses: Normal pulses.     Heart sounds: Normal heart sounds. No murmur heard. Pulmonary:     Effort: Pulmonary effort is normal. No respiratory distress.     Breath sounds: Normal breath sounds. No wheezing, rhonchi or  rales.  Abdominal:     General: Abdomen is flat. There is no distension.     Palpations: Abdomen is soft. There is no mass.     Tenderness: There is abdominal tenderness.     Comments: Left flank tenderness to palpation  Musculoskeletal:     Right lower leg: No edema.     Left lower leg: No edema.  Skin:    General: Skin is warm and dry.     Findings: No rash.  Neurological:     General: No focal deficit present.     Mental Status: She is alert and oriented to person, place, and time. Mental status is at baseline.  Psychiatric:        Mood and Affect: Mood normal.     (all labs ordered are listed, but only abnormal results are displayed) Labs Reviewed  COMPREHENSIVE METABOLIC PANEL WITH GFR - Abnormal; Notable for the following components:      Result Value   Glucose, Bld 331 (*)    All other components within normal limits  CBC WITH DIFFERENTIAL/PLATELET - Abnormal; Notable for the following components:   WBC 12.7 (*)    RDW 15.8 (*)    Platelets 452 (*)    Neutro Abs 11.3 (*)    Abs Immature Granulocytes 0.10 (*)    All other components within normal limits  URINALYSIS, ROUTINE W REFLEX MICROSCOPIC - Abnormal; Notable for the following components:   Glucose, UA >=500 (*)    Hgb urine dipstick SMALL (*)    Ketones, ur 20 (*)    Protein, ur 100 (*)    All other components within normal limits  URINE DRUG SCREEN - Abnormal; Notable for the following components:   Tetrahydrocannabinol POSITIVE (*)    Fentanyl  POSITIVE (*)    All other components within normal limits  LIPASE, BLOOD    EKG: None  Radiology: CT ABDOMEN PELVIS W CONTRAST Result Date: 08/08/2024 CLINICAL DATA:  Acute abdominal pain. EXAM: CT ABDOMEN AND PELVIS WITH CONTRAST TECHNIQUE: Multidetector CT imaging of the abdomen and pelvis was performed using the standard protocol following bolus administration of intravenous contrast. RADIATION DOSE REDUCTION: This exam was performed according to the  departmental dose-optimization program which includes automated exposure control, adjustment of the mA and/or kV according to patient size and/or use of iterative reconstruction technique. CONTRAST:  OMNIPAQUE  IOHEXOL  300 MG/ML  SOLN COMPARISON:  Most recent CT 07/18/2024 FINDINGS: Lower chest: Clear lung bases. Hepatobiliary: Mild hepatic steatosis. No focal liver abnormality. Stable biliary tree post cholecystectomy. Pancreas: No ductal dilatation or inflammation. Spleen: Normal in size without focal abnormality. Adrenals/Urinary Tract: No adrenal nodule. No hydronephrosis or renal inflammation. No renal calculi. Moderate bladder distension  with equivocal wall thickening about the dome. Stomach/Bowel: Wall thickening of the distal esophagus. Fluid within the stomach with accentuation of rugal folds and mild pre pyloric gastric wall thickening. Similar findings were seen on prior exam. Decompressed small bowel. Majority of the colon is nondistended. Diffuse colonic wall thickening versus nondistention. Multiple colonic diverticula without focal diverticulitis. Normal appendix visualized. Vascular/Lymphatic: No acute vascular findings. Mild aortic atherosclerosis. Patent portal vein. No enlarged lymph nodes in the abdomen or pelvis. Reproductive: Status post hysterectomy. No adnexal masses. Other: No free air, free fluid, or intra-abdominal fluid collection. Small fat containing umbilical hernia. Musculoskeletal: There are no acute or suspicious osseous abnormalities. IMPRESSION: 1. Wall thickening of the distal esophagus, can be seen with reflux or esophagitis. 2. Fluid within the stomach with accentuation of rugal folds and mild pre pyloric gastric wall thickening, similar to prior exam. Findings may represent gastritis or peptic ulcer disease. 3. Diffuse colonic wall thickening versus nondistention. Recommend clinical correlation for colitis. 4. Colonic diverticulosis without focal diverticulitis. 5.  Moderate bladder distension with equivocal wall thickening about the dome, potentially cystitis. 6. Mild hepatic steatosis. Aortic Atherosclerosis (ICD10-I70.0). Electronically Signed   By: Andrea Gasman M.D.   On: 08/08/2024 16:27     Procedures   Medications Ordered in the ED  acetaminophen  (TYLENOL ) tablet 1,000 mg (has no administration in time range)  HYDROmorphone  (DILAUDID ) injection 0.5 mg (has no administration in time range)  ondansetron  (ZOFRAN ) injection 4 mg (4 mg Intravenous Given 08/08/24 1307)  fentaNYL  (SUBLIMAZE ) injection 50 mcg (50 mcg Intravenous Given 08/08/24 1306)  sodium chloride  0.9 % bolus 1,000 mL (0 mLs Intravenous Stopped 08/08/24 1447)  droperidol  (INAPSINE ) 2.5 MG/ML injection 1.25 mg (1.25 mg Intravenous Given 08/08/24 1440)  iohexol  (OMNIPAQUE ) 300 MG/ML solution 100 mL (100 mLs Intravenous Contrast Given 08/08/24 1535)  HYDROmorphone  (DILAUDID ) injection 1 mg (1 mg Intravenous Given 08/08/24 1725)  pantoprazole  (PROTONIX ) injection 40 mg (40 mg Intravenous Given 08/08/24 1724)                                    Medical Decision Making Amount and/or Complexity of Data Reviewed Labs: ordered. Radiology: ordered.  Risk OTC drugs. Prescription drug management.    This patient presents to the ED for concern of abdominal pain, this involves an extensive number of treatment options, and is a complaint that carries with it a high risk of complications and morbidity.  The differential diagnosis includes gastroenteritis, colitis, small bowel obstruction, appendicitis, cholecystitis, pancreatitis, nephrolithiasis, UTI, pyelonephritis   Co morbidities that complicate the patient evaluation  CKD, CHF, DM, GERD, HLD, HTN, hypothyroidism, IBS, migraines   Additional history obtained:  07/25/2024 ECHO: 60-65% EF 12/25/2023 endoscopy: No gross lesions in the entire esophagus.  Gastritis - biopsied.  Congested duodenal mucosa-previous duodenal ulcers have  healed.   Problem List / ED Course / Critical interventions / Medication management  Patient presented for left flank abdominal pain, nausea, and vomiting since yesterday. Physical exam with left flank tenderness to palpation. Rest of physical exam reassuring. Patient tachycardic but otherwise with stable vitals. I Ordered, and personally interpreted labs.  UA not concerning for infection.  CBC with leukocytosis of 12.7.  No anemia.  CMP with hyperglycemia at 331.  Lipase within normal limits.  UDS positive for cannabis. I ordered imaging studies including CT Abd/Pelvis with contrast: evaluate for structural/surgical etiology of patients' severe abdominal pain.  I independently visualized  and interpreted imaging and I agree with the radiologist interpretation of esophagitis/gastritis and possible PUD. Also possible colitis. Provided patient with droperidol , zofran , IV fluids, protonix , and dilaudid . Strong suspicion that cannabis may be related to chronic abdominal pain along with PUD. Patient already on appropriate regimen for PUD and has follow up with GI. Patient afebrile and tachycardia resolved with IV fluids and pain management. Patient denies recent ABX use. Symptoms have only been present for the past 1 day and patient does not appear to meet criteria for ABX treatment of colitis today. Patient stating that she has plenty of Protonix  at home but would like a refill of Zofran . Patient's nausea and pain is now under control and she is tolerating PO well. Patient with follow up with outpatient GI in the next upcoming month. Patient to also follow up with PCP. I have reviewed the patients home medicines and have made adjustments as needed The patient has been appropriately medically screened and/or stabilized in the ED. I have low suspicion for any other emergent medical condition which would require further screening, evaluation or treatment in the ED or require inpatient management. At time of  discharge the patient is hemodynamically stable and in no acute distress. I have discussed work-up results and diagnosis with patient and answered all questions. Patient is agreeable with discharge plan. We discussed strict return precautions for returning to the emergency department and they verbalized understanding.     Social Determinants of Health:  none       Final diagnoses:  Peptic ulcer  Nausea vomiting and diarrhea    ED Discharge Orders          Ordered    ondansetron  (ZOFRAN -ODT) 4 MG disintegrating tablet  Every 8 hours PRN        08/08/24 1823               Hoy Nidia FALCON, PA-C 08/08/24 1840    Bernard Drivers, MD 08/09/24 [phone]

## 2024-08-14 ENCOUNTER — Other Ambulatory Visit: Payer: Self-pay

## 2024-08-14 ENCOUNTER — Emergency Department (HOSPITAL_COMMUNITY)

## 2024-08-14 ENCOUNTER — Emergency Department (HOSPITAL_COMMUNITY)
Admission: EM | Admit: 2024-08-14 | Discharge: 2024-08-15 | Disposition: A | Discharge status: Home / Self Care | Attending: Emergency Medicine | Admitting: Emergency Medicine

## 2024-08-14 DIAGNOSIS — Z794 Long term (current) use of insulin: Secondary | ICD-10-CM | POA: Insufficient documentation

## 2024-08-14 DIAGNOSIS — N189 Chronic kidney disease, unspecified: Secondary | ICD-10-CM | POA: Diagnosis not present

## 2024-08-14 DIAGNOSIS — Z7982 Long term (current) use of aspirin: Secondary | ICD-10-CM | POA: Insufficient documentation

## 2024-08-14 DIAGNOSIS — I130 Hypertensive heart and chronic kidney disease with heart failure and stage 1 through stage 4 chronic kidney disease, or unspecified chronic kidney disease: Secondary | ICD-10-CM | POA: Diagnosis not present

## 2024-08-14 DIAGNOSIS — I951 Orthostatic hypotension: Secondary | ICD-10-CM | POA: Diagnosis not present

## 2024-08-14 DIAGNOSIS — D72829 Elevated white blood cell count, unspecified: Secondary | ICD-10-CM | POA: Insufficient documentation

## 2024-08-14 DIAGNOSIS — R079 Chest pain, unspecified: Secondary | ICD-10-CM | POA: Diagnosis present

## 2024-08-14 DIAGNOSIS — E039 Hypothyroidism, unspecified: Secondary | ICD-10-CM | POA: Diagnosis not present

## 2024-08-14 DIAGNOSIS — E1122 Type 2 diabetes mellitus with diabetic chronic kidney disease: Secondary | ICD-10-CM | POA: Insufficient documentation

## 2024-08-14 DIAGNOSIS — Z79890 Hormone replacement therapy (postmenopausal): Secondary | ICD-10-CM | POA: Diagnosis not present

## 2024-08-14 DIAGNOSIS — E860 Dehydration: Secondary | ICD-10-CM | POA: Insufficient documentation

## 2024-08-14 DIAGNOSIS — R0789 Other chest pain: Secondary | ICD-10-CM | POA: Diagnosis not present

## 2024-08-14 DIAGNOSIS — I509 Heart failure, unspecified: Secondary | ICD-10-CM | POA: Insufficient documentation

## 2024-08-14 LAB — BASIC METABOLIC PANEL WITH GFR
Anion gap: 14 (ref 5–15)
BUN: 27 mg/dL — ABNORMAL HIGH (ref 8–23)
CO2: 25 mmol/L (ref 22–32)
Calcium: 9 mg/dL (ref 8.9–10.3)
Chloride: 96 mmol/L — ABNORMAL LOW (ref 98–111)
Creatinine, Ser: 1.05 mg/dL — ABNORMAL HIGH (ref 0.44–1.00)
GFR, Estimated: >60 mL/min (ref >60)
Glucose, Bld: 236 mg/dL — ABNORMAL HIGH (ref 70–99)
Potassium: 4.1 mmol/L (ref 3.5–5.1)
Sodium: 135 mmol/L (ref 135–145)

## 2024-08-14 LAB — TROPONIN I (HIGH SENSITIVITY)
Troponin I (High Sensitivity): 3 ng/L (ref <18)
Troponin I (High Sensitivity): 4 ng/L (ref <18)

## 2024-08-14 LAB — CBC
HCT: 44.5 % (ref 36.0–46.0)
Hemoglobin: 14.6 g/dL (ref 12.0–15.0)
MCH: 28.2 pg (ref 26.0–34.0)
MCHC: 32.8 g/dL (ref 30.0–36.0)
MCV: 85.9 fL (ref 80.0–100.0)
Platelets: 532 K/uL — ABNORMAL HIGH (ref 150–400)
RBC: 5.18 MIL/uL — ABNORMAL HIGH (ref 3.87–5.11)
RDW: 16.1 % — ABNORMAL HIGH (ref 11.5–15.5)
WBC: 12.9 K/uL — ABNORMAL HIGH (ref 4.0–10.5)
nRBC: 0 % (ref 0.0–0.2)

## 2024-08-14 MED ORDER — LACTATED RINGERS IV BOLUS
1000.0000 mL | Freq: Once | INTRAVENOUS | Status: AC
  Administered 2024-08-14: 1000 mL via INTRAVENOUS

## 2024-08-14 MED ORDER — ALUM & MAG HYDROXIDE-SIMETH 200-200-20 MG/5ML PO SUSP
30.0000 mL | Freq: Once | ORAL | Status: AC
  Administered 2024-08-14: 30 mL via ORAL
  Filled 2024-08-14: qty 30

## 2024-08-14 MED ORDER — ONDANSETRON HCL 4 MG/2ML IJ SOLN
4.0000 mg | Freq: Once | INTRAMUSCULAR | Status: AC
  Administered 2024-08-14: 4 mg via INTRAVENOUS
  Filled 2024-08-14: qty 2

## 2024-08-14 MED ORDER — ACETAMINOPHEN 500 MG PO TABS
1000.0000 mg | ORAL_TABLET | Freq: Once | ORAL | Status: AC
  Administered 2024-08-14: 1000 mg via ORAL
  Filled 2024-08-14: qty 2

## 2024-08-14 MED ORDER — FENTANYL CITRATE PF 50 MCG/ML IJ SOSY
25.0000 ug | PREFILLED_SYRINGE | Freq: Once | INTRAMUSCULAR | Status: AC
  Administered 2024-08-14: 25 ug via INTRAVENOUS
  Filled 2024-08-14: qty 1

## 2024-08-14 MED ORDER — FAMOTIDINE IN NACL 20-0.9 MG/50ML-% IV SOLN
20.0000 mg | Freq: Once | INTRAVENOUS | Status: AC
  Administered 2024-08-14: 20 mg via INTRAVENOUS
  Filled 2024-08-14: qty 50

## 2024-08-14 NOTE — ED Provider Notes (Signed)
 [place] [facility]GENC[facility] DEPARTMENT AT [facility] Provider Note   CSN: [id number] Arrival date & time: 08/14/24  1807     Patient presents with: Chest Pain   [name] [name] is a 63 y.o. female.    Chest Pain Associated symptoms: nausea   Patient is a 63 year old female was in the ED today for acute onset chest pain, left-sided radiating to her left axilla, left arm and upper left side of her neck that started acutely today.  Notes that it is mildly worse on exertion.  Noted that she is also having accompanying epigastric abdominal discomfort, for which she had already been worked up for previously 6 days ago but has been remained persistent.  Has not worsened.  This that she has has not been eating or drinking very much recently with her husband having recent undergone heart surgery.  Noting some mild dizziness when she stands up.  Worried that she may fall again.  Endorses marijuana use, denies alcohol use  Previous medical history of CKD, CHF, diabetes, GERD, HLD, HTN, hypothyroidism, IBS, migraines.  Denies fever, headache, vision changes, diplopia, dysphagia, odynophagia, cough, congestion, vomiting, diarrhea, melena, hematochezia, dysuria, vaginal discharge, vaginal bleeding, lower leg swelling.     Prior to Admission medications   Medication Sig Start Date End Date Taking? Authorizing Provider  metoCLOPramide  (REGLAN ) 10 MG tablet Take 1 tablet (10 mg total) by mouth every 6 (six) hours. 08/15/24  Yes Beola Terrall RAMAN, PA-C  aspirin  EC 81 MG tablet Take 81 mg by mouth daily. Swallow whole.    [provider]  Continuous Glucose Sensor (DEXCOM G7 SENSOR) MISC Inject 1 each into the skin See admin instructions. Place 1 new sensor into the skin every 10 days 01/30/24   [provider]  gabapentin  (NEURONTIN ) 100 MG capsule Take 100 mg by mouth at bedtime.    [provider]  Glucagon, rDNA, (GLUCAGON EMERGENCY) 1 MG KIT Inject 1 mg into the skin  See admin instructions. Inject one mg into the skin see administration instructions. Follow package directions for low blood sugar. 06/18/23   [provider]  Insulin  Disposable Pump (OMNIPOD 5 G7 PODS, GEN 5,) MISC Inject 1 Device into the skin every 3 (three) days.    [provider]  levothyroxine  (SYNTHROID ) 150 MCG tablet Take 1 tablet (150 mcg total) by mouth daily before breakfast. 06/01/24   [name], [name], MD  midodrine  (PROAMATINE ) 5 MG tablet Take 1 tablet (5 mg total) by mouth 3 (three) times daily with meals. 07/27/24 08/26/24  Rosario Leatrice FERNS, MD  NOVOLOG  FLEXPEN 100 UNIT/ML FlexPen Inject 10 Units into the skin See admin instructions. Inject 10 units into the skin three times a day with meals, PLUS sliding scale- ONLY WHEN NOT USING THE OMNIPOD 07/27/24   Rosario Leatrice FERNS, MD  omega-3 acid ethyl esters (LOVAZA ) 1 g capsule Take 2 capsules (2 g total) by mouth daily as needed (for circulation). 07/27/24   Rosario Leatrice FERNS, MD  ondansetron  (ZOFRAN ) 4 MG tablet Take 2 tablets (8 mg total) by mouth every 8 (eight) hours as needed for nausea or vomiting. 05/31/24   [name], [name], MD  ondansetron  (ZOFRAN -ODT) 4 MG disintegrating tablet Take 1 tablet (4 mg total) by mouth every 8 (eight) hours as needed for nausea. 08/08/24   Hoy Nidia FALCON, PA-C  pantoprazole  (PROTONIX ) 40 MG tablet Take 1 tablet (40 mg total) by mouth daily. 07/28/24   Rosario Leatrice FERNS, MD  TOUJEO  LYNFORD  300 UNIT/ML Solostar Pen Inject 25 Units into the skin See admin instructions. Inject 25 units into the skin at bedtime ONLY WHEN NOT USING THE OMNIPOD 07/27/24   Rosario Leatrice FERNS, MD    Allergies: Codeine, Ciprofloxacin , and Flagyl  [metronidazole ]    Review of Systems  Cardiovascular:  Positive for chest pain.  Gastrointestinal:  Positive for nausea.  All other systems reviewed and are negative.   Updated Vital Signs BP 118/86   Pulse 95   Temp 98.1 F (36.7 C) (Oral)   Resp 18    Ht (P) 4' 11 (1.499 m)   Wt 55.3 kg   LMP 08/11/2012   SpO2 100%   BMI (P) 24.64 kg/m   Physical Exam Vitals and nursing note reviewed.  Constitutional:      General: She is not in acute distress.    Appearance: Normal appearance. She is not ill-appearing or diaphoretic.  HENT:     Head: Normocephalic and atraumatic.  Eyes:     General: No scleral icterus.       Right eye: No discharge.        Left eye: No discharge.     Extraocular Movements: Extraocular movements intact.     Conjunctiva/sclera: Conjunctivae normal.  Cardiovascular:     Rate and Rhythm: Normal rate and regular rhythm.     Pulses: Normal pulses.     Heart sounds: Normal heart sounds. No murmur heard.    No friction rub. No gallop.  Pulmonary:     Effort: Pulmonary effort is normal. No respiratory distress.     Breath sounds: No stridor. No wheezing, rhonchi or rales.  Chest:     Chest wall: Tenderness (Reproducible left-sided chest wall tenderness noted to palpation) present.  Abdominal:     General: Abdomen is flat. There is no distension.     Palpations: Abdomen is soft.     Tenderness: There is no abdominal tenderness. There is no right CVA tenderness, left CVA tenderness, guarding or rebound.  Musculoskeletal:        General: No swelling, deformity or signs of injury.     Cervical back: Normal range of motion. No rigidity.     Right lower leg: No edema.     Left lower leg: No edema.  Skin:    General: Skin is warm and dry.     Findings: No bruising, erythema or lesion.  Neurological:     General: No focal deficit present.     Mental Status: She is alert and oriented to person, place, and time. Mental status is at baseline.     Sensory: No sensory deficit.     Motor: No weakness.  Psychiatric:        Mood and Affect: Mood normal.     (all labs ordered are listed, but only abnormal results are displayed) Labs Reviewed  CBC - Abnormal; Notable for the following components:      Result Value    WBC 12.9 (*)    RBC 5.18 (*)    RDW 16.1 (*)    Platelets 532 (*)    All other components within normal limits  BASIC METABOLIC PANEL WITH GFR - Abnormal; Notable for the following components:   Chloride 96 (*)    Glucose, Bld 236 (*)    BUN 27 (*)    Creatinine, Ser 1.05 (*)    All other components within normal limits  TROPONIN I (HIGH SENSITIVITY)  TROPONIN I (HIGH SENSITIVITY)    EKG: EKG  Interpretation Date/Time:  Friday August 14 2024 18:13:19 EDT Ventricular Rate:  92 PR Interval:  138 QRS Duration:  84 QT Interval:  352 QTC Calculation: 435 R Axis:   94  Text Interpretation: Normal sinus rhythm Rightward axis Borderline ECG When compared with ECG of 08-Aug-2024 11:59, No significant change was found Confirmed by Raford Lenis ([id number]) on 08/15/2024 12:06:26 AM  Radiology: DG Chest 2 View Result Date: 08/14/2024 EXAM: 2 VIEW(S) XRAY OF THE CHEST 08/14/2024 06:51:48 PM COMPARISON: 01/10/2024. Normal. CLINICAL HISTORY: Chest pain. FINDINGS: LUNGS AND PLEURA: No focal pulmonary opacity. No pulmonary edema. No pleural effusion. No pneumothorax. HEART AND MEDIASTINUM: No acute abnormality of the cardiac and mediastinal silhouettes. BONES AND SOFT TISSUES: No acute osseous abnormality. IMPRESSION: 1. No acute process. Electronically signed by: Norman Gatlin MD 08/14/2024 06:58 PM EDT RP Workstation: [id number]    Procedures   Medications Ordered in the ED  metoCLOPramide  (REGLAN ) injection 10 mg (has no administration in time range)  lactated ringers  bolus 1,000 mL (has no administration in time range)  fentaNYL  (SUBLIMAZE ) injection 25 mcg (25 mcg Intravenous Given 08/14/24 2044)  alum & mag hydroxide-simeth (MAALOX/MYLANTA) 200-200-20 MG/5ML suspension 30 mL (30 mLs Oral Given 08/14/24 2044)  famotidine  (PEPCID ) IVPB 20 mg premix (0 mg Intravenous Stopped 08/14/24 2139)  lactated ringers  bolus 1,000 mL (0 mLs Intravenous Stopped 08/14/24 2234)  ondansetron  (ZOFRAN ) injection 4  mg (4 mg Intravenous Given 08/14/24 2141)  acetaminophen  (TYLENOL ) tablet 1,000 mg (1,000 mg Oral Given 08/14/24 2243)  lactated ringers  bolus 1,000 mL (1,000 mLs Intravenous New Bag/Given 08/14/24 2249)                                Medical Decision Making Amount and/or Complexity of Data Reviewed Labs: ordered. Radiology: ordered.  Risk OTC drugs. Prescription drug management.   This patient is a 63 year old female who presents to the ED for concern of left-sided chest pain rating to the left arm, left-sided neck, epigastric region has been present starting acutely today accompanied with dizziness when she stands up.  Has been noted to have been worked up and admitted recently on 8/14 as well as being seen on 8/30 for same symptoms.  She is requesting narcotics for these visits, with hospitalist believing likely gastroparesis as cause.  Also requesting narcotics at this visit.  Notes that she has her husband at home had recently undergone heart surgery and cannot take care of her and is requesting to be admitted.  Noted to be hypotensive upon arrival to the ED.    On physical exam, patient is in no acute distress, afebrile, alert and orient x 4, speaking in full sentences, nontachypneic, nontachycardic. Notably has reproducible left-sided chest wall pain to palpation.  No rashes or bruising noted.  Mild epigastric abdominal tenderness noted to palpation.  No CVA tenderness.  Unremarkable exam otherwise.  Patient had already recently undergone a CT of her abdomen.  Provider at that time was suspicious that cannabis may be involved.  Noted to have a GI follow-up this month.  Upon reevaluation, patient notes that her symptoms have mildly improved however she is still experiencing some reproducible chest wall pain to palpation and requesting more narcotics.  Vital signs do show that she is orthostatic, providing additional fluid.  On reevaluation, she notes that she is still having the chest  pain and now feels nauseous.  Asking for nausea meds.  I provided some Reglan .  With patient care being transferred over this time.  Differential diagnoses prior to evaluation: The emergent differential diagnosis includes, but is not limited to, ACS, AAS, Pulmonary Embolism, Tension Pneumothorax, Esophageal Rupture, Cardiac Tamponade, Pericarditis, Myocarditis, Pneumothorax, Pneumonia, Aortic Stenosis, CHF Exacerbation, GERD,  Esophageal Spasm,  Mallory-Weiss, Costochondritis, Musculoskeletal Chest Wall Pain, Anxiety / Panic Attack. This is not an exhaustive differential.   Past Medical History / Co-morbidities / Social History: CKD, CHF, diabetes, GERD, HLD, HTN, hypothyroidism, IBS, migraines  Additional history: Chart reviewed. Pertinent results include:   Last seen in the emergency department on 08/08/2024 for peptic ulcer, noting left flank pain, nausea and vomiting and diarrhea at that time. To have an EF of 60-65% on 07/2024.  Noted to have been seen for syncope and admitted to the hospital 07/23/2024, noted to have orthostatic symptoms that time with persistent nausea and vomiting similar to today's presentation.  Hospitalist suspected to that opioid use may be contributing to patient's nausea and vomiting and thus leading to her orthostatic hypotension.  Lab Tests/Imaging studies: I personally interpreted labs/imaging and the pertinent results include:   CBC notes an elevated white count and RBC as well as elevated platelets likely secondary to hyper concentration.  Secondary to dehydration.  BMP notes an elevated creatinine, elevated BUN likely secondary to dehydration. Troponin and delta unremarkable.  Chest x-ray unremarkable. I agree with the radiologist interpretation.  Cardiac monitoring: EKG obtained and interpreted by myself and attending physician which shows: NSR   Medications: I ordered medication including famotidine , Zofran , LR, Tylenol , Maalox, fentanyl .  I have  reviewed the patients home medicines and have made adjustments as needed.  Critical Interventions: None  Social Determinants of Health: Reports that her husband is recently undergone surgery and is not able to help around the house.   Disposition: 12:17 AM Care of [name] [name] transferred to [facility] and Dr. Raford at the end of my shift as the patient will require reassessment once labs/imaging have resulted. Patient presentation, ED course, and plan of care discussed with review of all pertinent labs and imaging. Please see his/her note for further details regarding further ED course and disposition. Plan at time of handoff is Provide 3rd L and nausea meds. This may be altered or completely changed at the discretion of the oncoming team pending results of further workup.  Final diagnoses:  Orthostatic hypotension  Chest wall tenderness  Dehydration    ED Discharge Orders          Ordered    metoCLOPramide  (REGLAN ) 10 MG tablet  Every 6 hours        08/15/24 0016               Beola Terrall RAMAN, PA-C 08/15/24 0018    Elnor Jayson LABOR, DO 08/18/24 [phone]

## 2024-08-14 NOTE — ED Triage Notes (Signed)
 Patient arrives via [facility] for left side reproducible chest pain x 4 days ago. Tender on palpation. Patient endorses 10/10 pain and is requesting narcotics from EMS. Patient is alert and oriented.    EMS vitals 112/70 HR 80 98 on O2 12 lead unremarkable

## 2024-08-14 NOTE — ED Provider Notes (Incomplete)
 [place] EMERGENCY DEPARTMENT AT [facility] Provider Note   CSN: [id number] Arrival date & time: 08/14/24  1807     Patient presents with: Chest Pain   [name] [name] is a 63 y.o. female.    Chest Pain Associated symptoms: nausea   Patient is a 63 year old female was in the ED today for acute onset chest pain, left-sided radiating to her left axilla, left arm and upper left side of her neck that started acutely today.  Notes that it is mildly worse on exertion.  Noted that she is also having accompanying epigastric abdominal discomfort, for which she had already been worked up for previously 6 days ago but has been remained persistent.  Has not worsened.  This that she has has not been eating or drinking very much recently with her husband having recent undergone heart surgery.  Noting some mild dizziness when she stands up.  Worried that she may fall again.  Previous medical history of CKD, CHF, diabetes, GERD, HLD, HTN, hypothyroidism, IBS, migraines.  Denies fever, headache, vision changes, diplopia, dysphagia, odynophagia, cough, congestion, vomiting, diarrhea, melena, hematochezia, dysuria, vaginal discharge, vaginal bleeding, lower leg swelling.     Prior to Admission medications   Medication Sig Start Date End Date Taking? Authorizing Provider  aspirin  EC 81 MG tablet Take 81 mg by mouth daily. Swallow whole.    [provider]  Continuous Glucose Sensor (DEXCOM G7 SENSOR) MISC Inject 1 each into the skin See admin instructions. Place 1 new sensor into the skin every 10 days 01/30/24   [provider]  gabapentin  (NEURONTIN ) 100 MG capsule Take 100 mg by mouth at bedtime.    [provider]  Glucagon, rDNA, (GLUCAGON EMERGENCY) 1 MG KIT Inject 1 mg into the skin See admin instructions. Inject one mg into the skin see administration instructions. Follow package directions for low blood sugar. 06/18/23   [provider]  Insulin   Disposable Pump (OMNIPOD 5 G7 PODS, GEN 5,) MISC Inject 1 Device into the skin every 3 (three) days.    [provider]  levothyroxine  (SYNTHROID ) 150 MCG tablet Take 1 tablet (150 mcg total) by mouth daily before breakfast. 06/01/24   [name], [name], MD  midodrine  (PROAMATINE ) 5 MG tablet Take 1 tablet (5 mg total) by mouth 3 (three) times daily with meals. 07/27/24 08/26/24  Rosario Eland I, MD  NOVOLOG  FLEXPEN 100 UNIT/ML FlexPen Inject 10 Units into the skin See admin instructions. Inject 10 units into the skin three times a day with meals, PLUS sliding scale- ONLY WHEN NOT USING THE OMNIPOD 07/27/24   Rosario Eland FERNS, MD  omega-3 acid ethyl esters (LOVAZA ) 1 g capsule Take 2 capsules (2 g total) by mouth daily as needed (for circulation). 07/27/24   Rosario Eland FERNS, MD  ondansetron  (ZOFRAN ) 4 MG tablet Take 2 tablets (8 mg total) by mouth every 8 (eight) hours as needed for nausea or vomiting. 05/31/24   [name], [name], MD  ondansetron  (ZOFRAN -ODT) 4 MG disintegrating tablet Take 1 tablet (4 mg total) by mouth every 8 (eight) hours as needed for nausea. 08/08/24   Hoy Nidia FALCON, PA-C  pantoprazole  (PROTONIX ) 40 MG tablet Take 1 tablet (40 mg total) by mouth daily. 07/28/24   Rosario Eland FERNS, MD  TOUJEO  SOLOSTAR 300 UNIT/ML Solostar Pen Inject 25 Units into the skin See admin instructions. Inject 25 units into the skin at bedtime ONLY WHEN NOT USING THE OMNIPOD 07/27/24   Rosario Eland  I, MD    Allergies: Codeine, Ciprofloxacin , and Flagyl  [metronidazole ]    Review of Systems  Cardiovascular:  Positive for chest pain.  Gastrointestinal:  Positive for nausea.  All other systems reviewed and are negative.   Updated Vital Signs BP 123/68 (BP Location: Right Arm)   Pulse 88   Temp 98.3 F (36.8 C) (Oral)   Resp 11   Ht (P) 4' 11 (1.499 m)   Wt 55.3 kg   LMP 08/11/2012   SpO2 100%   BMI (P) 24.64 kg/m   Physical Exam Vitals and nursing note reviewed.   Constitutional:      General: She is not in acute distress.    Appearance: Normal appearance. She is not ill-appearing or diaphoretic.  HENT:     Head: Normocephalic and atraumatic.  Eyes:     General: No scleral icterus.       Right eye: No discharge.        Left eye: No discharge.     Extraocular Movements: Extraocular movements intact.     Conjunctiva/sclera: Conjunctivae normal.  Cardiovascular:     Rate and Rhythm: Normal rate and regular rhythm.     Pulses: Normal pulses.     Heart sounds: Normal heart sounds. No murmur heard.    No friction rub. No gallop.  Pulmonary:     Effort: Pulmonary effort is normal. No respiratory distress.     Breath sounds: No stridor. No wheezing, rhonchi or rales.  Chest:     Chest wall: Tenderness (Reproducible left-sided chest wall tenderness noted to palpation) present.  Abdominal:     General: Abdomen is flat. There is no distension.     Palpations: Abdomen is soft.     Tenderness: There is no abdominal tenderness. There is no right CVA tenderness, left CVA tenderness, guarding or rebound.  Musculoskeletal:        General: No swelling, deformity or signs of injury.     Cervical back: Normal range of motion. No rigidity.     Right lower leg: No edema.     Left lower leg: No edema.  Skin:    General: Skin is warm and dry.     Findings: No bruising, erythema or lesion.  Neurological:     General: No focal deficit present.     Mental Status: She is alert and oriented to person, place, and time. Mental status is at baseline.     Sensory: No sensory deficit.     Motor: No weakness.  Psychiatric:        Mood and Affect: Mood normal.     (all labs ordered are listed, but only abnormal results are displayed) Labs Reviewed  CBC - Abnormal; Notable for the following components:      Result Value   WBC 12.9 (*)    RBC 5.18 (*)    RDW 16.1 (*)    Platelets 532 (*)    All other components within normal limits  BASIC METABOLIC PANEL WITH  GFR  TROPONIN I (HIGH SENSITIVITY)    EKG: None  Radiology: DG Chest 2 View Result Date: 08/14/2024 EXAM: 2 VIEW(S) XRAY OF THE CHEST 08/14/2024 06:51:48 PM COMPARISON: 01/10/2024. Normal. CLINICAL HISTORY: Chest pain. FINDINGS: LUNGS AND PLEURA: No focal pulmonary opacity. No pulmonary edema. No pleural effusion. No pneumothorax. HEART AND MEDIASTINUM: No acute abnormality of the cardiac and mediastinal silhouettes. BONES AND SOFT TISSUES: No acute osseous abnormality. IMPRESSION: 1. No acute process. Electronically signed by: Norman Gatlin MD 08/14/2024  06:58 PM EDT RP Workstation: [id number]    Procedures   Medications Ordered in the ED - No data to display                              Medical Decision Making Amount and/or Complexity of Data Reviewed Labs: ordered. Radiology: ordered.  Risk OTC drugs. Prescription drug management.   This patient is a 63 year old female who presents to the ED for concern of left-sided chest pain rating to the left arm, left-sided neck, epigastric region has been present starting acutely today accompanied with dizziness when she stands up.  Has been noted to have been worked up and admitted recently on 8/14 as well as being seen on 8/30 for same symptoms.  She is requesting narcotics for these visits, with hospitalist believing likely gastroparesis as cause.  Also requesting narcotics at this visit.  Notes that she has her husband at home had recently undergone heart surgery and cannot take care of her and is requesting to be admitted.  Noted to be hypotensive upon arrival to the ED.    On physical exam, patient is in no acute distress, afebrile, alert and orient x 4, speaking in full sentences, nontachypneic, nontachycardic. Notably has reproducible left-sided chest wall pain to palpation.  No rashes or bruising noted.  Mild epigastric abdominal tenderness noted to palpation.  No CVA tenderness.  Unremarkable exam otherwise.  Patient had already  recently undergone a CT of her abdomen.  Provider at that time was suspicious that cannabis may be involved.  Noted to have a GI follow-up this month.  Upon reevaluation, patient notes that her symptoms have mildly improved however she is still experiencing some reproducible chest wall pain to palpation and requesting more narcotics.  Vital signs do show that she is orthostatic, providing additional fluid.    Differential diagnoses prior to evaluation: The emergent differential diagnosis includes, but is not limited to, ACS, AAS, Pulmonary Embolism, Tension Pneumothorax, Esophageal Rupture, Cardiac Tamponade, Pericarditis, Myocarditis, Pneumothorax, Pneumonia, Aortic Stenosis, CHF Exacerbation, GERD,  Esophageal Spasm,  Mallory-Weiss, Costochondritis, Musculoskeletal Chest Wall Pain, Anxiety / Panic Attack. This is not an exhaustive differential.   Past Medical History / Co-morbidities / Social History: CKD, CHF, diabetes, GERD, HLD, HTN, hypothyroidism, IBS, migraines  Additional history: Chart reviewed. Pertinent results include:   Last seen in the emergency department on 08/08/2024 for peptic ulcer, noting left flank pain, nausea and vomiting and diarrhea at that time. To have an EF of 60-65% on 07/2024.  Noted to have been seen for syncope and admitted to the hospital 07/23/2024, noted to have orthostatic symptoms that time with persistent nausea and vomiting similar to today's presentation.  Hospitalist suspected to that opioid use may be contributing to patient's nausea and vomiting and thus leading to her orthostatic hypotension.  Lab Tests/Imaging studies: I personally interpreted labs/imaging and the pertinent results include:   CBC notes an elevated white count and RBC as well as elevated platelets likely secondary to hyper concentration.  Secondary to dehydration.  BMP notes an elevated creatinine, elevated BUN likely secondary to dehydration. Troponin and delta  unremarkable.  Chest x-ray unremarkable. I agree with the radiologist interpretation.  Cardiac monitoring: EKG obtained and interpreted by myself and attending physician which shows: NSR   Medications: I ordered medication including famotidine , Zofran , LR, Tylenol , Maalox, fentanyl .  I have reviewed the patients home medicines and have made adjustments as needed.  Critical Interventions: None  Social Determinants of Health: Reports that her husband is recently undergone surgery and is not able to help around the house.   Disposition: After consideration of the diagnostic results and the patients response to treatment, I feel that the patient would benefit from ***.   ***emergency department workup does not suggest an emergent condition requiring admission or immediate intervention beyond what has been performed at this time. The plan is: ***. The patient is safe for discharge and has been instructed to return immediately for worsening symptoms, change in symptoms or any other concerns.   {Document critical care time when appropriate  Document review of labs and clinical decision tools ie CHADS2VASC2, etc  Document your independent review of radiology images and any outside records  Document your discussion with family members, caretakers and with consultants  Document social determinants of health affecting pt's care  Document your decision making why or why not admission, treatments were needed:[id number]:::1}   Final diagnoses:  None    ED Discharge Orders     None

## 2024-08-14 NOTE — ED Notes (Signed)
 Lab called. Recollect needed. Phlebotomy at bedside.

## 2024-08-14 NOTE — Discharge Instructions (Addendum)
 You are seen today for orthostatic hypotension as well as some chest wall pain.  Your lab work today noted that you were dehydrated.  Recommend that you continue to follow-up with your GI appointment as well as continuing to stay well-hydrated.  With your chest wall pain being reproducible and your chest imaging and heart labs being very reassuring, low suspicion for any emergent causes of your symptoms today.  However would recommend you continue to stay hydrated and follow-up with PCP.  I sent an additional nausea medications for you to use.  You should use Tylenol  for your chest pain.  Additionally you can use Voltaren  gel as well as lidocaine  patches.  Take Tylenol  (acetominophen)  650mg  every 4-6 hours, as needed for pain or fever. Do not take more than 4,000 mg in a 24-hour period. As this may cause liver damage. While this is rare, if you begin to develop yellowing of the skin or eyes, stop taking and return to ER immediately.

## 2024-08-15 MED ORDER — LACTATED RINGERS IV BOLUS
1000.0000 mL | Freq: Once | INTRAVENOUS | Status: AC
  Administered 2024-08-15: 1000 mL via INTRAVENOUS

## 2024-08-15 MED ORDER — METOCLOPRAMIDE HCL 5 MG/ML IJ SOLN
10.0000 mg | Freq: Once | INTRAMUSCULAR | Status: AC
  Administered 2024-08-15: 10 mg via INTRAVENOUS
  Filled 2024-08-15: qty 2

## 2024-08-15 MED ORDER — OXYCODONE HCL 5 MG PO TABS
5.0000 mg | ORAL_TABLET | Freq: Once | ORAL | Status: AC
  Administered 2024-08-15: 5 mg via ORAL
  Filled 2024-08-15: qty 1

## 2024-08-15 MED ORDER — METOCLOPRAMIDE HCL 10 MG PO TABS: 10.0000 mg | ORAL_TABLET | Freq: Four times a day (QID) | ORAL | 0 refills | Status: DC

## 2024-08-15 NOTE — ED Notes (Signed)
 Patient tolerated drinking water without difficulty. Denies nausea. Patient requested pain medication before leaving. Message sent to PA.

## 2024-08-15 NOTE — ED Provider Notes (Signed)
 2:19 AM Care assumed at change of shift.   Patient presenting for N/V, dizziness with position change. Hx of similar complaints and hospital presentations. Chest pain previously felt to be secondary to gastritis vs PUD associated with frequent intractable vomiting episodes. Evaluation today is nonischemic. She is currently pending outpatient evaluation for gastroparesis. Patient was admitted 1 month ago for syncope and had echocardiogram completed which was reassuring.   Completed additional IVF. Reports nausea has resolved. Tolerating PO fluids. VSS. Appropriate for discharge. Return precautions given.   Keith Sor, PA-C 08/15/24 [id number]    Raford Lenis, MD 08/15/24 [phone]

## 2024-08-21 DIAGNOSIS — R55 Syncope and collapse: Secondary | ICD-10-CM

## 2024-08-24 ENCOUNTER — Ambulatory Visit: Payer: Self-pay | Admitting: Physician Assistant

## 2024-08-29 NOTE — ED Provider Notes (Signed)
 [facility] Emergency Department Emergency Department Provider Note  This document was created using the aid of voice recognition Dragon dictation software.   Provider at bedside: 08/29/2024 12:16 PM  History obtained from the: Patient  History   Chief Complaint  Patient presents with  . Abdominal Pain    HPI  [name] [name] is a 63 y.o. female who presents to the ED with complaints of generalized abdominal pain onset this morning.  Notes some nausea.  Denies any diarrhea.  No fevers chills or cold-like symptoms.  Initially denies marijuana use to me but then states she used it 4 days ago .  Strong history of chronic abdominal pain chart review shows she has had a plethora of ER visits and over 12 CTs since the first the year all relatively unremarkable.  No fevers no urinary symptoms no other complaints  ______________________ ROS: Pertinent positives and negatives per HPI.  Physical Exam   Vitals:   08/29/24 0833 08/29/24 0900 08/29/24 1100 08/29/24 1200  BP: 136/84 135/70 142/72 137/79  BP Location: Left arm     Patient Position: Lying     Pulse: 91 98 99 96  Resp: 20     Temp: 98.7 F (37.1 C)     TempSrc: Oral     SpO2: 99% 94% 99% 98%  Weight:      Height:        Physical Exam Constitutional:      General: She is not in acute distress.    Appearance: Normal appearance. She is not ill-appearing.  Cardiovascular:     Rate and Rhythm: Normal rate and regular rhythm.  Pulmonary:     Effort: Pulmonary effort is normal. No respiratory distress.     Breath sounds: Normal breath sounds. No stridor. No wheezing, rhonchi or rales.  Abdominal:     General: There is no distension.     Tenderness: There is generalized abdominal tenderness. There is no guarding or rebound.  Skin:    General: Skin is warm and dry.  Neurological:     General: No focal deficit present.     Mental Status: She is alert and oriented to person, place, and time.     ED Course      Medical Decision Making  Initial Intervention: Strongly suspect this is for chronic abdominal pain.  Will proceed with lab work, try droperidol  for the pain  DDX: Cannabinoid hyperemesis syndrome dehydration AKI metabolic derangement electrolyte abnormality did consider acute abdominal pathology though have a lower concern for bowel obstruction appendicitis biliary pathology pancreatitis etc.  Clinical Complexity  n/a   Patient's presentation is most consistent with acute presentation with potential threat to life or bodily function.  Patient's history of chronic abdominal pain increases the complexity of managing their  presentation with abdominal pain.    Provider time spent in patient care today, inclusive of but not limited to clinical reassessment, review of diagnostic studies, and discharge preparation, was greater than 30 minutes.   All Imaging and lab work personally viewed and interpreted by myself. My interpretations are as follow:  Presents with acute on chronic abdominal pain.  Lab work reassuring no profound white count CMP grossly normal.  UA does have some leukocytes but has squamous cells as well.  She has been treated multiple times for UTIs we will send a urine culture off however do not think it is needed to start on antibiotics at this time.  I strongly suspect her abdominal pain is  secondary to her cannabis use, she had great symptomatic relief with droperidol .  Thinks Reesal discharge home return precaution given.  Patient agreeable to plan  Medical Decision Making Amount and/or Complexity of Data Reviewed Labs: ordered.  Risk Prescription drug management.    ED Clinical Impression   1. Cannabinoid hyperemesis syndrome    FOLLOW UP [facility] -  EMERGENCY DEPARTMENT [address] [place]  [address] [phone] Arlyss BODILY [address] [place]  [address] [phone]      ED Disposition     ED Disposition  Discharge   Condition  Stable   Comment  --        _____________________________

## 2024-08-31 ENCOUNTER — Encounter (HOSPITAL_COMMUNITY): Payer: Self-pay

## 2024-08-31 ENCOUNTER — Other Ambulatory Visit: Payer: Self-pay

## 2024-08-31 ENCOUNTER — Inpatient Hospital Stay (HOSPITAL_COMMUNITY)
Admission: EM | Admit: 2024-08-31 | Discharge: 2024-09-04 | DRG: 392 | Disposition: A | Discharge status: Home / Self Care | Attending: Internal Medicine | Admitting: Internal Medicine

## 2024-08-31 ENCOUNTER — Emergency Department (HOSPITAL_COMMUNITY)

## 2024-08-31 DIAGNOSIS — E1122 Type 2 diabetes mellitus with diabetic chronic kidney disease: Secondary | ICD-10-CM | POA: Diagnosis present

## 2024-08-31 DIAGNOSIS — Z794 Long term (current) use of insulin: Secondary | ICD-10-CM

## 2024-08-31 DIAGNOSIS — I5032 Chronic diastolic (congestive) heart failure: Secondary | ICD-10-CM | POA: Diagnosis present

## 2024-08-31 DIAGNOSIS — Z9641 Presence of insulin pump (external) (internal): Secondary | ICD-10-CM | POA: Diagnosis present

## 2024-08-31 DIAGNOSIS — E871 Hypo-osmolality and hyponatremia: Secondary | ICD-10-CM | POA: Diagnosis present

## 2024-08-31 DIAGNOSIS — K219 Gastro-esophageal reflux disease without esophagitis: Secondary | ICD-10-CM | POA: Diagnosis present

## 2024-08-31 DIAGNOSIS — E1142 Type 2 diabetes mellitus with diabetic polyneuropathy: Secondary | ICD-10-CM | POA: Diagnosis present

## 2024-08-31 DIAGNOSIS — E039 Hypothyroidism, unspecified: Secondary | ICD-10-CM | POA: Diagnosis present

## 2024-08-31 DIAGNOSIS — R739 Hyperglycemia, unspecified: Principal | ICD-10-CM

## 2024-08-31 DIAGNOSIS — Z888 Allergy status to other drugs, medicaments and biological substances status: Secondary | ICD-10-CM

## 2024-08-31 DIAGNOSIS — Z7982 Long term (current) use of aspirin: Secondary | ICD-10-CM

## 2024-08-31 DIAGNOSIS — Z820 Family history of epilepsy and other diseases of the nervous system: Secondary | ICD-10-CM

## 2024-08-31 DIAGNOSIS — R112 Nausea with vomiting, unspecified: Secondary | ICD-10-CM | POA: Diagnosis present

## 2024-08-31 DIAGNOSIS — M810 Age-related osteoporosis without current pathological fracture: Secondary | ICD-10-CM | POA: Diagnosis present

## 2024-08-31 DIAGNOSIS — E876 Hypokalemia: Secondary | ICD-10-CM | POA: Diagnosis present

## 2024-08-31 DIAGNOSIS — Z87891 Personal history of nicotine dependence: Secondary | ICD-10-CM

## 2024-08-31 DIAGNOSIS — F1210 Cannabis abuse, uncomplicated: Secondary | ICD-10-CM | POA: Diagnosis present

## 2024-08-31 DIAGNOSIS — K2990 Gastroduodenitis, unspecified, without bleeding: Secondary | ICD-10-CM | POA: Diagnosis not present

## 2024-08-31 DIAGNOSIS — E785 Hyperlipidemia, unspecified: Secondary | ICD-10-CM | POA: Diagnosis present

## 2024-08-31 DIAGNOSIS — Z8249 Family history of ischemic heart disease and other diseases of the circulatory system: Secondary | ICD-10-CM

## 2024-08-31 DIAGNOSIS — I2510 Atherosclerotic heart disease of native coronary artery without angina pectoris: Secondary | ICD-10-CM | POA: Diagnosis present

## 2024-08-31 DIAGNOSIS — Z79890 Hormone replacement therapy (postmenopausal): Secondary | ICD-10-CM

## 2024-08-31 DIAGNOSIS — Z79899 Other long term (current) drug therapy: Secondary | ICD-10-CM

## 2024-08-31 DIAGNOSIS — Z833 Family history of diabetes mellitus: Secondary | ICD-10-CM

## 2024-08-31 DIAGNOSIS — Z881 Allergy status to other antibiotic agents status: Secondary | ICD-10-CM

## 2024-08-31 DIAGNOSIS — F331 Major depressive disorder, recurrent, moderate: Secondary | ICD-10-CM | POA: Diagnosis present

## 2024-08-31 DIAGNOSIS — N182 Chronic kidney disease, stage 2 (mild): Secondary | ICD-10-CM | POA: Diagnosis present

## 2024-08-31 DIAGNOSIS — I130 Hypertensive heart and chronic kidney disease with heart failure and stage 1 through stage 4 chronic kidney disease, or unspecified chronic kidney disease: Secondary | ICD-10-CM | POA: Diagnosis present

## 2024-08-31 DIAGNOSIS — Z90710 Acquired absence of both cervix and uterus: Secondary | ICD-10-CM

## 2024-08-31 DIAGNOSIS — Z885 Allergy status to narcotic agent status: Secondary | ICD-10-CM

## 2024-08-31 DIAGNOSIS — E1165 Type 2 diabetes mellitus with hyperglycemia: Secondary | ICD-10-CM | POA: Diagnosis present

## 2024-08-31 DIAGNOSIS — I480 Paroxysmal atrial fibrillation: Secondary | ICD-10-CM | POA: Diagnosis present

## 2024-08-31 LAB — COMPREHENSIVE METABOLIC PANEL WITH GFR
ALT: 10 U/L (ref 0–44)
AST: 20 U/L (ref 15–41)
Albumin: 4.3 g/dL (ref 3.5–5.0)
Alkaline Phosphatase: 84 U/L (ref 38–126)
Anion gap: 18 — ABNORMAL HIGH (ref 5–15)
BUN: 19 mg/dL (ref 8–23)
CO2: 20 mmol/L — ABNORMAL LOW (ref 22–32)
Calcium: 9.4 mg/dL (ref 8.9–10.3)
Chloride: 96 mmol/L — ABNORMAL LOW (ref 98–111)
Creatinine, Ser: 0.72 mg/dL (ref 0.44–1.00)
GFR, Estimated: >60 mL/min (ref >60)
Glucose, Bld: 338 mg/dL — ABNORMAL HIGH (ref 70–99)
Potassium: 4 mmol/L (ref 3.5–5.1)
Sodium: 134 mmol/L — ABNORMAL LOW (ref 135–145)
Total Bilirubin: 0.9 mg/dL (ref 0.0–1.2)
Total Protein: 7.7 g/dL (ref 6.5–8.1)

## 2024-08-31 LAB — URINALYSIS, ROUTINE W REFLEX MICROSCOPIC
Bilirubin Urine: NEGATIVE
Glucose, UA: >=500 mg/dL — AB
Hgb urine dipstick: NEGATIVE
Ketones, ur: 5 mg/dL — AB
Nitrite: NEGATIVE
Protein, ur: 30 mg/dL — AB
Specific Gravity, Urine: 1.015 (ref 1.005–1.030)
pH: 5 (ref 5.0–8.0)

## 2024-08-31 LAB — CBC
HCT: 44.4 % (ref 36.0–46.0)
Hemoglobin: 14.8 g/dL (ref 12.0–15.0)
MCH: 28.2 pg (ref 26.0–34.0)
MCHC: 33.3 g/dL (ref 30.0–36.0)
MCV: 84.6 fL (ref 80.0–100.0)
Platelets: 407 K/uL — ABNORMAL HIGH (ref 150–400)
RBC: 5.25 MIL/uL — ABNORMAL HIGH (ref 3.87–5.11)
RDW: 15.5 % (ref 11.5–15.5)
WBC: 13.9 K/uL — ABNORMAL HIGH (ref 4.0–10.5)
nRBC: 0 % (ref 0.0–0.2)

## 2024-08-31 LAB — LIPASE, BLOOD: Lipase: 16 U/L (ref 11–51)

## 2024-08-31 LAB — I-STAT VENOUS BLOOD GAS, ED
Acid-Base Excess: 6 mmol/L — ABNORMAL HIGH (ref 0.0–2.0)
Bicarbonate: 30.3 mmol/L — ABNORMAL HIGH (ref 20.0–28.0)
Calcium, Ion: 1.14 mmol/L — ABNORMAL LOW (ref 1.15–1.40)
HCT: 39 % (ref 36.0–46.0)
Hemoglobin: 13.3 g/dL (ref 12.0–15.0)
O2 Saturation: 96 %
Potassium: 3.9 mmol/L (ref 3.5–5.1)
Sodium: 137 mmol/L (ref 135–145)
TCO2: 32 mmol/L (ref 22–32)
pCO2, Ven: 40.4 mmHg — ABNORMAL LOW (ref 44–60)
pH, Ven: 7.483 — ABNORMAL HIGH (ref 7.25–7.43)
pO2, Ven: 79 mmHg — ABNORMAL HIGH (ref 32–45)

## 2024-08-31 LAB — CBG MONITORING, ED
Glucose-Capillary: 333 mg/dL — ABNORMAL HIGH (ref 70–99)
Glucose-Capillary: 202 mg/dL — ABNORMAL HIGH (ref 70–99)

## 2024-08-31 LAB — GLUCOSE, CAPILLARY: Glucose-Capillary: 220 mg/dL — ABNORMAL HIGH (ref 70–99)

## 2024-08-31 MED ORDER — HYDROMORPHONE HCL 1 MG/ML IJ SOLN
0.5000 mg | INTRAMUSCULAR | Status: DC | PRN
  Administered 2024-09-01 – 2024-09-04 (×20): 0.5 mg via INTRAVENOUS
  Filled 2024-08-31 (×20): qty 0.5

## 2024-08-31 MED ORDER — IOHEXOL 350 MG/ML SOLN
75.0000 mL | Freq: Once | INTRAVENOUS | Status: AC | PRN
  Administered 2024-08-31: 75 mL via INTRAVENOUS

## 2024-08-31 MED ORDER — INSULIN ASPART 100 UNIT/ML IJ SOLN
0.0000 [IU] | Freq: Three times a day (TID) | INTRAMUSCULAR | Status: DC
  Administered 2024-09-01 – 2024-09-02 (×2): 1 [IU] via SUBCUTANEOUS
  Administered 2024-09-03: 2 [IU] via SUBCUTANEOUS
  Administered 2024-09-03 (×2): 1 [IU] via SUBCUTANEOUS
  Administered 2024-09-04 (×2): 2 [IU] via SUBCUTANEOUS

## 2024-08-31 MED ORDER — ONDANSETRON HCL 4 MG/2ML IJ SOLN
4.0000 mg | Freq: Once | INTRAMUSCULAR | Status: AC
  Administered 2024-08-31: 4 mg via INTRAVENOUS
  Filled 2024-08-31: qty 2

## 2024-08-31 MED ORDER — ONDANSETRON HCL 4 MG PO TABS: 4.0000 mg | ORAL_TABLET | Freq: Four times a day (QID) | ORAL | Status: DC | PRN

## 2024-08-31 MED ORDER — PANTOPRAZOLE SODIUM 40 MG IV SOLR
40.0000 mg | Freq: Two times a day (BID) | INTRAVENOUS | Status: DC
  Administered 2024-08-31 – 2024-09-04 (×8): 40 mg via INTRAVENOUS
  Filled 2024-08-31 (×8): qty 10

## 2024-08-31 MED ORDER — INSULIN ASPART 100 UNIT/ML IJ SOLN
0.0000 [IU] | Freq: Every day | INTRAMUSCULAR | Status: DC
  Administered 2024-08-31: 2 [IU] via SUBCUTANEOUS

## 2024-08-31 MED ORDER — HALOPERIDOL LACTATE 5 MG/ML IJ SOLN
2.0000 mg | Freq: Once | INTRAMUSCULAR | Status: AC
  Administered 2024-08-31: 2 mg via INTRAVENOUS
  Filled 2024-08-31: qty 1

## 2024-08-31 MED ORDER — HYDROMORPHONE HCL 1 MG/ML IJ SOLN
1.0000 mg | Freq: Once | INTRAMUSCULAR | Status: AC
  Administered 2024-08-31: 1 mg via INTRAVENOUS
  Filled 2024-08-31: qty 1

## 2024-08-31 MED ORDER — SODIUM CHLORIDE 0.9 % IV BOLUS
1000.0000 mL | Freq: Once | INTRAVENOUS | Status: AC
Start: 2024-08-31 — End: 2024-08-31
  Administered 2024-08-31: 1000 mL via INTRAVENOUS

## 2024-08-31 MED ORDER — LACTATED RINGERS IV BOLUS
1000.0000 mL | Freq: Once | INTRAVENOUS | Status: AC
  Administered 2024-08-31: 1000 mL via INTRAVENOUS

## 2024-08-31 MED ORDER — METOCLOPRAMIDE HCL 5 MG/ML IJ SOLN
5.0000 mg | Freq: Once | INTRAMUSCULAR | Status: AC
  Administered 2024-08-31: 5 mg via INTRAVENOUS
  Filled 2024-08-31: qty 2

## 2024-08-31 MED ORDER — ENOXAPARIN SODIUM 40 MG/0.4ML IJ SOSY
40.0000 mg | PREFILLED_SYRINGE | INTRAMUSCULAR | Status: DC
  Administered 2024-08-31 – 2024-09-03 (×4): 40 mg via SUBCUTANEOUS
  Filled 2024-08-31 (×4): qty 0.4

## 2024-08-31 MED ORDER — FAMOTIDINE IN NACL 20-0.9 MG/50ML-% IV SOLN
20.0000 mg | Freq: Once | INTRAVENOUS | Status: AC
  Administered 2024-08-31: 20 mg via INTRAVENOUS
  Filled 2024-08-31: qty 50

## 2024-08-31 MED ORDER — ONDANSETRON HCL 4 MG/2ML IJ SOLN
4.0000 mg | Freq: Once | INTRAMUSCULAR | Status: AC | PRN
Start: 2024-08-31 — End: 2024-08-31
  Administered 2024-08-31: 4 mg via INTRAVENOUS
  Filled 2024-08-31: qty 2

## 2024-08-31 MED ORDER — ONDANSETRON HCL 4 MG/2ML IJ SOLN
4.0000 mg | Freq: Four times a day (QID) | INTRAMUSCULAR | Status: DC | PRN
  Administered 2024-09-01 – 2024-09-03 (×5): 4 mg via INTRAVENOUS
  Filled 2024-08-31 (×7): qty 2

## 2024-08-31 MED ORDER — LACTATED RINGERS IV SOLN: INTRAVENOUS | Status: AC

## 2024-08-31 NOTE — ED Provider Notes (Signed)
 [place] EMERGENCY DEPARTMENT AT [facility] Provider Note   CSN: [id number] Arrival date & time: 08/31/24  9060     Patient presents with: Fall, Emesis, and Abdominal Pain   [name] [name] is a 63 y.o. female.   Patient came in due to fall that occurred this morning.  She said she felt a little bit hot with her heart beating really fast before she fell.  Said she felt a little dizzy.  Patient was about to sit down and had the fall.  She lost consciousness for about a minute which was witnessed by her husband.  Patient said she has had a fall like this in the past and had fractured her left foot. She developed her abdominal pain after that. She was in extreme pain and asked if she can get Dilaudid  which  is what she usually got in the past. patient denied any fevers.  Said that her abdominal pain and vomiting stopped after she went to the ED recently.  Pt said that her abdominal pain started again after today's fall.  Does not have a gallbladder; does have appendix.   Fall Associated symptoms include abdominal pain.  Emesis Associated symptoms: abdominal pain   Abdominal Pain Associated symptoms: vomiting        Prior to Admission medications   Medication Sig Start Date End Date Taking? Authorizing Provider  aspirin  EC 81 MG tablet Take 81 mg by mouth daily. Swallow whole.    [provider]  Continuous Glucose Sensor (DEXCOM G7 SENSOR) MISC Inject 1 each into the skin See admin instructions. Place 1 new sensor into the skin every 10 days 01/30/24   [provider]  gabapentin  (NEURONTIN ) 100 MG capsule Take 100 mg by mouth at bedtime.    [provider]  Glucagon, rDNA, (GLUCAGON EMERGENCY) 1 MG KIT Inject 1 mg into the skin See admin instructions. Inject one mg into the skin see administration instructions. Follow package directions for low blood sugar. 06/18/23   [provider]  Insulin  Disposable Pump (OMNIPOD 5 G7 PODS, GEN 5,) MISC  Inject 1 Device into the skin every 3 (three) days.    [provider]  levothyroxine  (SYNTHROID ) 150 MCG tablet Take 1 tablet (150 mcg total) by mouth daily before breakfast. 06/01/24   [name], [name], MD  metoCLOPramide  (REGLAN ) 10 MG tablet Take 1 tablet (10 mg total) by mouth every 6 (six) hours. 08/15/24   Beola Terrall RAMAN, PA-C  NOVOLOG  FLEXPEN 100 UNIT/ML FlexPen Inject 10 Units into the skin See admin instructions. Inject 10 units into the skin three times a day with meals, PLUS sliding scale- ONLY WHEN NOT USING THE OMNIPOD 07/27/24   Rosario Leatrice FERNS, MD  omega-3 acid ethyl esters (LOVAZA ) 1 g capsule Take 2 capsules (2 g total) by mouth daily as needed (for circulation). 07/27/24   Rosario Leatrice FERNS, MD  ondansetron  (ZOFRAN ) 4 MG tablet Take 2 tablets (8 mg total) by mouth every 8 (eight) hours as needed for nausea or vomiting. 05/31/24   [name], [name], MD  ondansetron  (ZOFRAN -ODT) 4 MG disintegrating tablet Take 1 tablet (4 mg total) by mouth every 8 (eight) hours as needed for nausea. 08/08/24   Hoy Nidia FALCON, PA-C  pantoprazole  (PROTONIX ) 40 MG tablet Take 1 tablet (40 mg total) by mouth daily. 07/28/24   Rosario Leatrice FERNS, MD  TOUJEO  SOLOSTAR 300 UNIT/ML Solostar Pen Inject 25 Units into the skin See admin instructions. Inject 25 units into  the skin at bedtime ONLY WHEN NOT USING THE OMNIPOD 07/27/24   Rosario Leatrice FERNS, MD    Allergies: Codeine, Ciprofloxacin , and Flagyl  [metronidazole ]    Review of Systems  Reason unable to perform ROS: All pertinent ROS in HPI, MDM.  Gastrointestinal:  Positive for abdominal pain and vomiting.    Updated Vital Signs BP (!) 155/107   Pulse (!) 103   Temp 98 F (36.7 C) (Oral)   Resp 14   Ht 4' 11 (1.499 m)   Wt 52.6 kg   LMP 08/11/2012   SpO2 100%   BMI 23.43 kg/m   Physical Exam Constitutional:      Comments: In extreme pain  HENT:     Head: Normocephalic.  Cardiovascular:     Rate and Rhythm: Normal rate and  regular rhythm.  Pulmonary:     Effort: Pulmonary effort is normal.     Breath sounds: Normal breath sounds.  Abdominal:     Palpations: Abdomen is soft.     Tenderness: There is generalized abdominal tenderness. There is no rebound.  Skin:    General: Skin is warm.  Neurological:     Mental Status: She is alert.     (all labs ordered are listed, but only abnormal results are displayed) Labs Reviewed  LIPASE, BLOOD  COMPREHENSIVE METABOLIC PANEL WITH GFR  CBC  URINALYSIS, ROUTINE W REFLEX MICROSCOPIC    EKG: None  Radiology: No results found.   Procedures  none Medications Ordered in the ED  ondansetron  (ZOFRAN ) injection 4 mg (4 mg Intravenous Given 08/31/24 1020)                                   Medical Decision Making Patient came in due to fall and abdominal pain.  Patient has had multiple ED visits recently and was also seen for vomiting and abdominal pain a few days ago during which she was diagnosed with hyperemesis cannabis syndrome.  Differential diagnosis includes dehydration, vasovagal syncope, gastritis, PUD, pancreatitis.  She was given IV fluids and pain medication.  Will wait on CT scan results for further management.  Disposition: Care was assumed by next ED provider during sign off  Amount and/or Complexity of Data Reviewed Labs: ordered.    Details: CBC, CMP, lipase, UA Radiology: ordered.    Details: CT head, CT abdomen/pelvis  Risk Prescription drug management. Decision regarding hospitalization.      Final diagnoses:  None    ED Discharge Orders     None          Edgardo Pontiff, DO 09/04/24 1505    Dreama Longs, MD 09/05/24 2134

## 2024-08-31 NOTE — ED Triage Notes (Signed)
 Pt bib [facility] from home for fall after losing balance. Pt reports falling last night around 9pm and she hit her head. No blood thinners. Pt believes she may have had LOC for about 2 minutes. This morning patient woke up with severe head pain and abdominal pain with nausea and vomiting. EMS reports small knot on back of head.    CBG 396 162/100 108   Hx Diabetes 2, HTN

## 2024-08-31 NOTE — H&P (Signed)
 History and Physical    Patient: [name] [name] FMW:[id number] DOB: 03/31/1961 DOA: 08/31/2024 DOS: the patient was seen and examined on 08/31/2024 PCP: Arlyss Eugene BRAVO, NP  Patient coming from: Home  Chief Complaint:  Chief Complaint  Patient presents with   Fall   Emesis   Abdominal Pain   HPI: [name] [name] is a 63 y.o. female with medical history significant of insulin -dependent diabetes, GERD, irritable bowel syndrome, hypothyroidism, essential hypertension, hyperlipidemia, osteoporosis, diastolic dysfunction CHF among other things who presented to the ER with abdominal pain and intractable nausea vomiting.  Patient has also been weak and losing balance.  She fell last night around 9:00 and hit her head.  Patient is not on any blood thinners.  Patient has continued to have significant nausea and vomiting in the ER.  Despite multiple treatments with Pepcid , Zofran  and supportive care she continues to have the nausea and vomiting.  Also have the abdominal pain which is persistent.  CT abdomen pelvis showed gastroduodenitis.  With patient's intractable nausea vomiting she has been admitted to the hospital for observation and management.  Review of Systems: As mentioned in the history of present illness. All other systems reviewed and are negative. Past Medical History:  Diagnosis Date   Allergy    seasonal   Anxiety    B12 deficiency    Chest pain 04/05/2017   CHF (congestive heart failure) ([name])    Diabetes mellitus type 1, uncontrolled    dr. 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) 10/29/2013   Leg cramping    @ night (09/18/2013)   Migraine    once q couple months (11/26/2018)   Osteoporosis    Peripheral  neuropathy    Umbilical hernia    unrepaired (09/18/2013)   Past Surgical History:  Procedure Laterality Date   BIOPSY  06/03/2019   Procedure: BIOPSY;  Surgeon: Charlanne Groom, MD;  Location: [facility];  Service: Endoscopy;;   BIOPSY  08/17/2020   Procedure: BIOPSY;  Surgeon: Eda Iha, MD;  Location: [facility];  Service: Gastroenterology;;   BIOPSY  02/05/2023   Procedure: BIOPSY;  Surgeon: Aneita Gwendlyn DASEN, MD;  Location: [facility];  Service: Gastroenterology;;   BIOPSY  09/24/2023   Procedure: BIOPSY;  Surgeon: Wilhelmenia Aloha Raddle., MD;  Location: [facility];  Service: Gastroenterology;;   CESAREAN SECTION  1989   ENTEROSCOPY N/A 06/03/2019   Procedure: ENTEROSCOPY;  Surgeon: Charlanne Groom, MD;  Location: [facility];  Service: Endoscopy;  Laterality: N/A;   ESOPHAGOGASTRODUODENOSCOPY (EGD) WITH PROPOFOL  N/A 08/17/2020   Procedure: ESOPHAGOGASTRODUODENOSCOPY (EGD) WITH PROPOFOL ;  Surgeon: Eda Iha, MD;  Location: [facility];  Service: Gastroenterology;  Laterality: N/A;   ESOPHAGOGASTRODUODENOSCOPY (EGD) WITH PROPOFOL  N/A 02/05/2023   Procedure: ESOPHAGOGASTRODUODENOSCOPY (EGD) WITH PROPOFOL ;  Surgeon: Aneita Gwendlyn DASEN, MD;  Location: [facility];  Service: Gastroenterology;  Laterality: N/A;   ESOPHAGOGASTRODUODENOSCOPY (EGD) WITH PROPOFOL  N/A 09/24/2023   Procedure: ESOPHAGOGASTRODUODENOSCOPY (EGD) WITH PROPOFOL ;  Surgeon: Wilhelmenia Aloha Raddle., MD;  Location: [facility];  Service: Gastroenterology;  Laterality: N/A;   EUS N/A 09/24/2023   Procedure: UPPER ENDOSCOPIC ULTRASOUND (EUS) LINEAR;  Surgeon: Wilhelmenia Aloha Raddle., MD;  Location: [facility];  Service: Gastroenterology;  Laterality: N/A;   LAPAROSCOPIC  ASSISTED VAGINAL HYSTERECTOMY  09/23/2012   Procedure: LAPAROSCOPIC ASSISTED VAGINAL HYSTERECTOMY;  Surgeon: Duwaine Blumenthal, DO;  Location: [facility];  Service: Gynecology;  Laterality: N/A;  pull Dr Jacqulyn instrument   LEFT HEART CATH AND CORONARY  ANGIOGRAPHY N/A 02/11/2017   Procedure: Left Heart Cath and Coronary Angiography;  Surgeon: Dorn JINNY Lesches, MD;  Location: [facility];  Service: Cardiovascular;  Laterality: N/A;   TUBAL LIGATION Bilateral 1992   Social History:  reports that she quit smoking about 33 years ago. Her smoking use included cigarettes. She started smoking about 43 years ago. She has a 1.2 pack-year smoking history. She has never used smokeless tobacco. She reports that she does not currently use drugs after having used the following drugs: Marijuana. She reports that she does not drink alcohol.  Allergies  Allergen Reactions   Codeine Hives and Anxiety   Ciprofloxacin  Diarrhea, Nausea Only and Other (See Comments)    Caused extreme nausea   Flagyl  [Metronidazole ] Nausea Only and Other (See Comments)    Can tolerate ONLY VIA IV (otherwise, SEVERE NAUSEA)    Family History  Problem Relation Age of Onset   Heart failure Mother 21       Her heart skipped a beat and stopped - per pt   Heart failure Brother    Diabetes Brother    Diabetes Maternal Grandmother    Alzheimer's disease Maternal Grandmother    Coronary artery disease Maternal Grandmother    Heart attack Maternal Grandfather    Diabetes Other    Heart disease Other    Colon polyps Neg Hx    Esophageal cancer Neg Hx    Liver cancer Neg Hx    Pancreatic cancer Neg Hx    Stomach cancer Neg Hx    Crohn's disease Neg Hx    Rectal cancer Neg Hx    Colon cancer Neg Hx     Prior to Admission medications   Medication Sig Start Date End Date Taking? Authorizing Provider  aspirin  EC 81 MG tablet Take 81 mg by mouth daily. Swallow whole.    [provider]  Continuous Glucose Sensor (DEXCOM G7 SENSOR) MISC Inject 1 each into the skin See admin instructions. Place 1 new sensor into the skin every 10 days 01/30/24   [provider]  gabapentin  (NEURONTIN ) 100 MG capsule Take 100 mg by mouth at bedtime.    [provider]   Glucagon, rDNA, (GLUCAGON EMERGENCY) 1 MG KIT Inject 1 mg into the skin See admin instructions. Inject one mg into the skin see administration instructions. Follow package directions for low blood sugar. 06/18/23   [provider]  Insulin  Disposable Pump (OMNIPOD 5 G7 PODS, GEN 5,) MISC Inject 1 Device into the skin every 3 (three) days.    [provider]  levothyroxine  (SYNTHROID ) 150 MCG tablet Take 1 tablet (150 mcg total) by mouth daily before breakfast. 06/01/24   [name], [name], MD  metoCLOPramide  (REGLAN ) 10 MG tablet Take 1 tablet (10 mg total) by mouth every 6 (six) hours. 08/15/24   Bauer, Collin S, PA-C  NOVOLOG  FLEXPEN 100 UNIT/ML FlexPen Inject 10 Units into the skin See admin instructions. Inject 10 units into the skin three times a day with meals, PLUS sliding scale- ONLY WHEN NOT USING THE OMNIPOD 07/27/24   Rosario Leatrice FERNS, MD  omega-3 acid ethyl esters (LOVAZA ) 1 g capsule Take 2 capsules (2 g total) by mouth daily as needed (for circulation). 07/27/24  Rosario Leatrice FERNS, MD  ondansetron  (ZOFRAN ) 4 MG tablet Take 2 tablets (8 mg total) by mouth every 8 (eight) hours as needed for nausea or vomiting. 05/31/24   [name], [name], MD  ondansetron  (ZOFRAN -ODT) 4 MG disintegrating tablet Take 1 tablet (4 mg total) by mouth every 8 (eight) hours as needed for nausea. 08/08/24   Hoy Nidia FALCON, PA-C  pantoprazole  (PROTONIX ) 40 MG tablet Take 1 tablet (40 mg total) by mouth daily. 07/28/24   Rosario Leatrice FERNS, MD  TOUJEO  SOLOSTAR 300 UNIT/ML Solostar Pen Inject 25 Units into the skin See admin instructions. Inject 25 units into the skin at bedtime ONLY WHEN NOT USING THE OMNIPOD 07/27/24   Rosario Leatrice FERNS, MD    Physical Exam: Vitals:   08/31/24 1526 08/31/24 1735 08/31/24 1900 08/31/24 2030  BP:  96/73 91/63 132/73  Pulse:  82 96 96  Resp:  14 (!) 22 14  Temp: 98.1 F (36.7 C) 98.2 F (36.8 C)    TempSrc: Oral Oral    SpO2:  100% 100% 97%  Weight:       Height:       Constitutional: Acutely ill looking no distress NAD, calm, comfortable Eyes: PERRL, lids and conjunctivae normal ENMT: Mucous membranes are moist. Posterior pharynx clear of any exudate or lesions.Normal dentition.  Neck: normal, supple, no masses, no thyromegaly Respiratory: clear to auscultation bilaterally, no wheezing, no crackles. Normal respiratory effort. No accessory muscle use.  Cardiovascular: Regular rate and rhythm, no murmurs / rubs / gallops. No extremity edema. 2+ pedal pulses. No carotid bruits.  Abdomen: Mild diffuse tenderness, no masses palpated. No hepatosplenomegaly. Bowel sounds positive.  Musculoskeletal: Good range of motion, no joint swelling or tenderness, Skin: no rashes, lesions, ulcers. No induration Neurologic: CN 2-12 grossly intact. Sensation intact, DTR normal. Strength 5/5 in all 4.  Psychiatric: Normal judgment and insight. Alert and oriented x 3.  Anxious mood  Data Reviewed:  Temperature 98.2, blood pressure 155/107, pulse 103 respirate 22.  White count is 13.9 hemoglobin 13.3 platelets 407.  Potassium 3.9 chloride 96 CO2 of 20.  CT abdomen pelvis showed hepatic steatosis with prominent gastroduodenal 4 suggestive of gastroduodenitis with no bowel obstruction head CT without contrast showed no acute findings  Assessment and Plan:  #1 acute gastroduodenitis: Patient has had intractable nausea with vomiting.  Also abdominal pain.  Cause is unclear.  Patient has diabetes and could have had gastroparesis as well.  At this point we will admit the patient for symptom control.  She has no hematemesis no melena.  No bright red blood per rectum.  Initiate IV PPIs twice a day.  Clear liquid diet.  If symptoms persist will need GI consultation for possible EGD and biopsy.  #2 hyponatremia: Hydrate.  Electrolytes otherwise stable.  #3 insulin -dependent diabetes: Initiate sliding scale insulin .  Patient will be on clear liquid diet.  #4 essential  hypertension: Blood pressure is borderline.  Will resume home regimen when able to take p.o.'s.  #5 GERD: Continue with PPIs  #6 hyperlipidemia: Continue with statin  #7 chronic diastolic heart failure: Appears compensated.  #8 paroxysmal atrial fibrillation: Patient in sinus rhythm at the moment.    Advance Care Planning:   Code Status: Full Code   Consults: None for now.  GI consult if not better  Family Communication: No family at bedside  Severity of Illness: The appropriate patient status for this patient is OBSERVATION. Observation status is judged to be reasonable and necessary in  order to provide the required intensity of service to ensure the patient's safety. The patient's presenting symptoms, physical exam findings, and initial radiographic and laboratory data in the context of their medical condition is felt to place them at decreased risk for further clinical deterioration. Furthermore, it is anticipated that the patient will be medically stable for discharge from the hospital within 2 midnights of admission.   AuthorBETHA SIM KNOLL, MD 08/31/2024 8:51 PM  For on call review [email or web address].

## 2024-08-31 NOTE — ED Provider Notes (Signed)
  Physical Exam  BP 112/86   Pulse (!) 101   Temp 98.1 F (36.7 C) (Oral)   Resp 12   Ht 4' 11 (1.499 m)   Wt 52.6 kg   LMP 08/11/2012   SpO2 100%   BMI 23.43 kg/m   Physical Exam Vitals and nursing note reviewed.  Constitutional:      General: She is not in acute distress.    Appearance: She is well-developed.  HENT:     Head: Normocephalic and atraumatic.  Eyes:     Conjunctiva/sclera: Conjunctivae normal.  Cardiovascular:     Rate and Rhythm: Normal rate and regular rhythm.     Heart sounds: No murmur heard. Pulmonary:     Effort: Pulmonary effort is normal. No respiratory distress.     Breath sounds: Normal breath sounds.  Abdominal:     Palpations: Abdomen is soft.     Tenderness: There is no abdominal tenderness.  Musculoskeletal:        General: No swelling.     Cervical back: Neck supple.  Skin:    General: Skin is warm and dry.     Capillary Refill: Capillary refill takes less than 2 seconds.  Neurological:     Mental Status: She is alert.  Psychiatric:        Mood and Affect: Mood normal.     Procedures  Procedures  ED Course / MDM    Medical Decision Making Amount and/or Complexity of Data Reviewed Labs: ordered. Radiology: ordered.  Risk Prescription drug management. Decision regarding hospitalization.    Recent patient signed out.  Patient presents because of nausea vomiting abdominal pain.  Laboratory workup mostly unremarkable.  Pending lipase.  CT abdomen shows gastroduodenitis.   Patient received multiple rounds of antiemetics as well as pain medication.  Unable to tolerate p.o.  Given this, patient placed in observation for further care.      Simon Lavonia SAILOR, MD 08/31/24 2229

## 2024-08-31 NOTE — Inpatient Diabetes Management (Signed)
 Inpatient Diabetes Program Recommendations  AACE/ADA: New Consensus Statement on Inpatient Glycemic Control (2015)  Target Ranges:  Prepandial:   less than 140 mg/dL      Peak postprandial:   less than 180 mg/dL (1-2 hours)      Critically ill patients:  140 - 180 mg/dL   Lab Results  Component Value Date   GLUCAP 333 (H) 08/31/2024   HGBA1C 8.4 (H) 05/29/2024    Review of Glycemic Control  Latest Reference Range & Units 08/31/24 12:50  Glucose-Capillary 70 - 99 mg/dL 666 (H)   Diabetes history: DM (last A1C=7.9% on 07/31/24) Outpatient Diabetes medications:  Omnipod insulin  pump (settings not documented in chart) Prior to insulin  pump she was taking Toujeo  25 units daily plus Novolog  10 units tid with meals  Current orders for Inpatient glycemic control:  None yet  Inpatient Diabetes Program Recommendations:    Alerted MD and RN of patient's history and she wears insulin  pump.  If admitted, will need insulin  orders including basal insulin  plus Novolog  meal coverage/correction.  While in the ED please check CBG's q 4 hours and add insulin  as needed.   Thanks,  Randall Bullocks, RN, BC-ADM Inpatient Diabetes Coordinator Pager [phone]  (8a-5p)

## 2024-09-01 DIAGNOSIS — I130 Hypertensive heart and chronic kidney disease with heart failure and stage 1 through stage 4 chronic kidney disease, or unspecified chronic kidney disease: Secondary | ICD-10-CM | POA: Diagnosis present

## 2024-09-01 DIAGNOSIS — I5032 Chronic diastolic (congestive) heart failure: Secondary | ICD-10-CM | POA: Diagnosis present

## 2024-09-01 DIAGNOSIS — R112 Nausea with vomiting, unspecified: Secondary | ICD-10-CM | POA: Diagnosis present

## 2024-09-01 DIAGNOSIS — F1210 Cannabis abuse, uncomplicated: Secondary | ICD-10-CM | POA: Diagnosis present

## 2024-09-01 DIAGNOSIS — N182 Chronic kidney disease, stage 2 (mild): Secondary | ICD-10-CM

## 2024-09-01 DIAGNOSIS — Z79899 Other long term (current) drug therapy: Secondary | ICD-10-CM | POA: Diagnosis not present

## 2024-09-01 DIAGNOSIS — Z881 Allergy status to other antibiotic agents status: Secondary | ICD-10-CM | POA: Diagnosis not present

## 2024-09-01 DIAGNOSIS — Z794 Long term (current) use of insulin: Secondary | ICD-10-CM | POA: Diagnosis not present

## 2024-09-01 DIAGNOSIS — E1165 Type 2 diabetes mellitus with hyperglycemia: Secondary | ICD-10-CM | POA: Diagnosis present

## 2024-09-01 DIAGNOSIS — K219 Gastro-esophageal reflux disease without esophagitis: Secondary | ICD-10-CM | POA: Diagnosis present

## 2024-09-01 DIAGNOSIS — E785 Hyperlipidemia, unspecified: Secondary | ICD-10-CM | POA: Diagnosis present

## 2024-09-01 DIAGNOSIS — E1142 Type 2 diabetes mellitus with diabetic polyneuropathy: Secondary | ICD-10-CM | POA: Diagnosis present

## 2024-09-01 DIAGNOSIS — Z79890 Hormone replacement therapy (postmenopausal): Secondary | ICD-10-CM | POA: Diagnosis not present

## 2024-09-01 DIAGNOSIS — Z833 Family history of diabetes mellitus: Secondary | ICD-10-CM | POA: Diagnosis not present

## 2024-09-01 DIAGNOSIS — I2510 Atherosclerotic heart disease of native coronary artery without angina pectoris: Secondary | ICD-10-CM | POA: Diagnosis present

## 2024-09-01 DIAGNOSIS — K2990 Gastroduodenitis, unspecified, without bleeding: Secondary | ICD-10-CM | POA: Diagnosis present

## 2024-09-01 DIAGNOSIS — Z7982 Long term (current) use of aspirin: Secondary | ICD-10-CM | POA: Diagnosis not present

## 2024-09-01 DIAGNOSIS — Z8249 Family history of ischemic heart disease and other diseases of the circulatory system: Secondary | ICD-10-CM | POA: Diagnosis not present

## 2024-09-01 DIAGNOSIS — E876 Hypokalemia: Secondary | ICD-10-CM | POA: Diagnosis present

## 2024-09-01 DIAGNOSIS — E871 Hypo-osmolality and hyponatremia: Secondary | ICD-10-CM | POA: Diagnosis present

## 2024-09-01 DIAGNOSIS — I480 Paroxysmal atrial fibrillation: Secondary | ICD-10-CM | POA: Diagnosis present

## 2024-09-01 DIAGNOSIS — E1122 Type 2 diabetes mellitus with diabetic chronic kidney disease: Secondary | ICD-10-CM | POA: Diagnosis present

## 2024-09-01 DIAGNOSIS — E039 Hypothyroidism, unspecified: Secondary | ICD-10-CM | POA: Diagnosis present

## 2024-09-01 DIAGNOSIS — Z87891 Personal history of nicotine dependence: Secondary | ICD-10-CM | POA: Diagnosis not present

## 2024-09-01 DIAGNOSIS — F331 Major depressive disorder, recurrent, moderate: Secondary | ICD-10-CM | POA: Diagnosis present

## 2024-09-01 LAB — CBC
HCT: 36.9 % (ref 36.0–46.0)
Hemoglobin: 12.3 g/dL (ref 12.0–15.0)
MCH: 28.3 pg (ref 26.0–34.0)
MCHC: 33.3 g/dL (ref 30.0–36.0)
MCV: 85 fL (ref 80.0–100.0)
Platelets: 314 K/uL (ref 150–400)
RBC: 4.34 MIL/uL (ref 3.87–5.11)
RDW: 15.6 % — ABNORMAL HIGH (ref 11.5–15.5)
WBC: 10.4 K/uL (ref 4.0–10.5)
nRBC: 0 % (ref 0.0–0.2)

## 2024-09-01 LAB — COMPREHENSIVE METABOLIC PANEL WITH GFR
ALT: 10 U/L (ref 0–44)
AST: 12 U/L — ABNORMAL LOW (ref 15–41)
Albumin: 3.1 g/dL — ABNORMAL LOW (ref 3.5–5.0)
Alkaline Phosphatase: 60 U/L (ref 38–126)
Anion gap: 10 (ref 5–15)
BUN: 13 mg/dL (ref 8–23)
CO2: 25 mmol/L (ref 22–32)
Calcium: 8.6 mg/dL — ABNORMAL LOW (ref 8.9–10.3)
Chloride: 103 mmol/L (ref 98–111)
Creatinine, Ser: 0.79 mg/dL (ref 0.44–1.00)
GFR, Estimated: >60 mL/min (ref >60)
Glucose, Bld: 122 mg/dL — ABNORMAL HIGH (ref 70–99)
Potassium: 3.4 mmol/L — ABNORMAL LOW (ref 3.5–5.1)
Sodium: 138 mmol/L (ref 135–145)
Total Bilirubin: 0.8 mg/dL (ref 0.0–1.2)
Total Protein: 5.8 g/dL — ABNORMAL LOW (ref 6.5–8.1)

## 2024-09-01 LAB — RAPID URINE DRUG SCREEN, HOSP PERFORMED
Amphetamines: NOT DETECTED
Barbiturates: NOT DETECTED
Benzodiazepines: NOT DETECTED
Cocaine: NOT DETECTED
Opiates: POSITIVE — AB
Tetrahydrocannabinol: POSITIVE — AB

## 2024-09-01 LAB — GLUCOSE, CAPILLARY
Glucose-Capillary: 142 mg/dL — ABNORMAL HIGH (ref 70–99)
Glucose-Capillary: 128 mg/dL — ABNORMAL HIGH (ref 70–99)
Glucose-Capillary: 102 mg/dL — ABNORMAL HIGH (ref 70–99)
Glucose-Capillary: 101 mg/dL — ABNORMAL HIGH (ref 70–99)

## 2024-09-01 MED ORDER — LEVOTHYROXINE SODIUM 75 MCG PO TABS
150.0000 ug | ORAL_TABLET | Freq: Every day | ORAL | Status: DC
  Administered 2024-09-02 – 2024-09-04 (×2): 150 ug via ORAL
  Filled 2024-09-01 (×3): qty 2

## 2024-09-01 MED ORDER — METOCLOPRAMIDE HCL 5 MG/ML IJ SOLN
5.0000 mg | Freq: Three times a day (TID) | INTRAMUSCULAR | Status: DC
  Administered 2024-09-01 – 2024-09-04 (×9): 5 mg via INTRAVENOUS
  Filled 2024-09-01 (×9): qty 2

## 2024-09-01 MED ORDER — POTASSIUM CHLORIDE 10 MEQ/100ML IV SOLN
10.0000 meq | INTRAVENOUS | Status: AC
  Administered 2024-09-01 (×3): 10 meq via INTRAVENOUS
  Filled 2024-09-01 (×3): qty 100

## 2024-09-01 MED ORDER — PROCHLORPERAZINE EDISYLATE 10 MG/2ML IJ SOLN
10.0000 mg | Freq: Once | INTRAMUSCULAR | Status: AC
  Administered 2024-09-01: 10 mg via INTRAVENOUS
  Filled 2024-09-01: qty 2

## 2024-09-01 NOTE — Plan of Care (Incomplete)

## 2024-09-01 NOTE — Progress Notes (Signed)
 Progress Note   Patient: [name] [name] FMW:[id number] DOB: August 30, 1961 DOA: 08/31/2024     0 DOS: the patient was seen and examined on 09/01/2024   Brief hospital course: [name] [name] is a 63 y.o. female with medical history significant of insulin -dependent diabetes, GERD, irritable bowel syndrome, hypothyroidism, essential hypertension, hyperlipidemia, osteoporosis, diastolic dysfunction CHF among other things who presented to the ER with abdominal pain and intractable nausea vomiting.  Patient has also been weak and losing balance.  She fell last night around 9:00 and hit her head.  Patient is not on any blood thinners.  Patient has continued to have significant nausea and vomiting in the ER.  Despite multiple treatments with Pepcid , Zofran  and supportive care she continues to have the nausea and vomiting.  Also have the abdominal pain which is persistent.  CT abdomen pelvis showed gastroduodenitis. She is admitted to the hospitalist service for further management.   Assessment and Plan: Intractable nausea and vomiting- Gastroduodenitis- Still has nausea, unable to tolerate clears.  Continue gentle IV fluids. Clears and advance the diet as tolerated. Continue antiemetics as needed.  Hypokalemia- Due to GI losses. IV repletion ordered.  Cannabis abuse- Prior admissions reviewed- she uses Cannabis. Discussed to quit illicit drug use. Urine drug screen ordered.  Chronic diastolic CHF No signs of decompensation. Not on Lasix  at home.   Uncontrolled Type 2 diabetes- A1c 8.4 two months ago. Continue accucheks, sliding scale insulin . She uses Omnipod, toujeo .      Out of bed to chair. Incentive spirometry. Nursing supportive care. Fall, aspiration precautions. Diet:  Diet Orders (From admission, onward)     Start     Ordered   08/31/24 2051  Diet clear liquid Room service appropriate? Yes; Fluid consistency: Thin  Diet effective now       Question Answer Comment  Room  service appropriate? Yes   Fluid consistency: Thin      08/31/24 2051           DVT prophylaxis: enoxaparin  (LOVENOX ) injection 40 mg Start: 08/31/24 2100 SCDs Start: 08/31/24 2051  Level of care: Med-Surg   Code Status: Full Code  Subjective: Patient is seen and examined today morning. She is weak, feels nauseous. Unable to tolerate clears. Did not get out of bed.  Physical Exam: Vitals:   08/31/24 2030 08/31/24 2149 09/01/24 0346 09/01/24 0712  BP: 132/73 (!) 162/93 121/70 135/76  Pulse: 96 86 83   Resp: 14 15 16 18   Temp:  98.2 F (36.8 C) 98.4 F (36.9 C) 98 F (36.7 C)  TempSrc:  Oral Oral Oral  SpO2: 97% 99% 99% 98%  Weight:      Height:        General -  Middle aged Caucasian female, distress due to nausea HEENT - PERRLA, EOMI, atraumatic head, non tender sinuses. Lung - Clear, no rales, rhonchi, wheezes. Heart - S1, S2 heard, no murmurs, rubs, trace pedal edema. Abdomen - Soft, lower abdomen tender, bowel sounds good Neuro - Alert, awake and oriented x 3, non focal exam. Skin - Warm and dry.  Data Reviewed:      Latest Ref Rng & Units 09/01/2024    3:40 AM 08/31/2024    5:12 PM 08/31/2024    9:47 AM  CBC  WBC 4.0 - 10.5 K/uL 10.4   13.9   Hemoglobin 12.0 - 15.0 g/dL 87.6  86.6  85.1   Hematocrit 36.0 - 46.0 % 36.9  39.0  44.4   Platelets  150 - 400 K/uL 314   407       Latest Ref Rng & Units 09/01/2024    3:40 AM 08/31/2024    5:12 PM 08/31/2024    9:47 AM  BMP  Glucose 70 - 99 mg/dL 877   661   BUN 8 - 23 mg/dL 13   19   Creatinine 9.55 - 1.00 mg/dL 9.20   9.27   Sodium 864 - 145 mmol/L 138  137  134   Potassium 3.5 - 5.1 mmol/L 3.4  3.9  4.0   Chloride 98 - 111 mmol/L 103   96   CO2 22 - 32 mmol/L 25   20   Calcium  8.9 - 10.3 mg/dL 8.6   9.4    CT ABDOMEN PELVIS W CONTRAST Result Date: 08/31/2024 CLINICAL DATA:  Abdominal pain, evaluate for obstruction or perforation EXAM: CT ABDOMEN AND PELVIS WITH CONTRAST TECHNIQUE: Multidetector CT  imaging of the abdomen and pelvis was performed using the standard protocol following bolus administration of intravenous contrast. RADIATION DOSE REDUCTION: This exam was performed according to the departmental dose-optimization program which includes automated exposure control, adjustment of the mA and/or kV according to patient size and/or use of iterative reconstruction technique. CONTRAST:  75mL OMNIPAQUE  IOHEXOL  350 MG/ML SOLN COMPARISON:  CT abdomen pelvis August 08, 2024 FINDINGS: Lower chest: No acute abnormality. Hepatobiliary: Elongated right hepatic lobe measuring 19.4 cm in craniocaudal dimension. An enhancing nodule in left hepatic lobe measuring 9 mm likely a flash filling hemangioma. Focal fat in segment 4 typical location measuring 1.4 cm. Cholecystectomy. Common bile duct is dilated up to 9.4 mm which can be seen the setting of post cholecystectomy. Pancreas: Atrophic changes of the pancreas. Accessory duct of Santorini, normal variation (query ansa pancreatica?). Evaluation is suboptimal on current nondedicated CT. No ductal dilatation or mass lesion. Spleen: Normal in size without focal abnormality. Adrenals/Urinary Tract: Adrenal glands are unremarkable. Kidneys are normal, without renal calculi, focal lesion, or hydronephrosis. Bladder is thick-walled and trabeculated with few left posterolateral wall wide mouth diverticula. Stomach/Bowel: Prominent gastric and duodenal folds suggestive of gastroduodenitis. Colonic diverticulosis without diverticulitis. Normal appendix. No bowel obstruction or evidence of perforation. Small bowel loops are normal. Vascular/Lymphatic: Atherosclerotic calcifications of aorta and branches. Reproductive: Hysterectomy.  No adnexal mass. Other: Small fat containing umbilical/paraumbilical hernia. Musculoskeletal: Mild degenerative changes of the spine. IMPRESSION: Hepatic steatosis. Left hepatic lobe enhancing lesion stable to prior most consistent with flash filling  hemangioma. Prominent gastroduodenal folds suggestive of gastroduodenitis. No bowel obstruction or perforation. Cholecystectomy and extrahepatic biliary dilatation (reservoir phenomenon) Remainder of the findings are stable to prior. Electronically Signed   By: Megan  Zare M.D.   On: 08/31/2024 15:24   CT Head Wo Contrast Result Date: 08/31/2024 CLINICAL DATA:  Fall with head trauma. EXAM: CT HEAD WITHOUT CONTRAST TECHNIQUE: Contiguous axial images were obtained from the base of the skull through the vertex without intravenous contrast. RADIATION DOSE REDUCTION: This exam was performed according to the departmental dose-optimization program which includes automated exposure control, adjustment of the mA and/or kV according to patient size and/or use of iterative reconstruction technique. COMPARISON:  07/24/2024 FINDINGS: Brain: Ventricles, cisterns and other CSF spaces are normal. There is no mass, mass effect, shift of midline structures or acute hemorrhage. No acute infarction. Vascular: No hyperdense vessel or unexpected calcification. Skull: Normal. Negative for fracture or focal lesion. Sinuses/Orbits: No acute finding. Other: None. IMPRESSION: No acute findings. Electronically Signed   By: Toribio Agreste M.D.  On: 08/31/2024 15:07    Family Communication: Discussed with patient, understand and agree. All questions answered.  Disposition: Status is: Inpatient Remains inpatient appropriate because: IV hydration, advance diet, IV antiemetics.  Planned Discharge Destination: Home     Time spent: 44 minutes  Author: Concepcion Riser, MD 09/01/2024 12:58 PM Secure chat 7am to 7pm For on call review [email or web address].

## 2024-09-01 NOTE — Inpatient Diabetes Management (Signed)
 Inpatient Diabetes Program Recommendations  AACE/ADA: New Consensus Statement on Inpatient Glycemic Control (2015)  Target Ranges:  Prepandial:   less than 140 mg/dL      Peak postprandial:   less than 180 mg/dL (1-2 hours)      Critically ill patients:  140 - 180 mg/dL   Lab Results  Component Value Date   GLUCAP 128 (H) 09/01/2024   HGBA1C 8.4 (H) 05/29/2024    Review of Glycemic Control  Latest Reference Range & Units 08/31/24 16:02 08/31/24 21:48 09/01/24 07:12 09/01/24 11:10  Glucose-Capillary 70 - 99 mg/dL 797 (H) 779 (H) 857 (H) 128 (H)   Diabetes history: Dm 2 Outpatient Diabetes medications:  Insulin  pump- Omnipod Current orders for Inpatient glycemic control:  Novolog  0-9 units tid with meals and HS  Inpatient Diabetes Program Recommendations:    Blood sugars currently within goal.  Patient states that she took insulin  pump off at home prior to coming to the hospital due to inability to eat.  Currently looks ok with only correction Novolog .  May need basal insulin  added eventually.  Will follow.   Thanks,  Randall Bullocks, RN, BC-ADM Inpatient Diabetes Coordinator Pager [phone]  (8a-5p)

## 2024-09-01 NOTE — Progress Notes (Signed)
 NEW ADMISSION NOTE   Arrival Method: ED stretcher Mental Orientation: AAOx4 Telemetry: NA Assessment: Completed Skin: See flowsheet IV: RFA Pain: 8/10 Tubes: n/a Safety Measures: Safety Fall Prevention Plan has been given, discussed and signed Admission: Completed [facility] Orientation: Patient has been orientated to the room, unit and staff.  Family: none at bedside   Orders have been reviewed and implemented. Will continue to monitor the patient. Call light has been placed within reach and bed alarm has been activated.

## 2024-09-02 DIAGNOSIS — E039 Hypothyroidism, unspecified: Secondary | ICD-10-CM | POA: Diagnosis not present

## 2024-09-02 DIAGNOSIS — R112 Nausea with vomiting, unspecified: Secondary | ICD-10-CM | POA: Diagnosis not present

## 2024-09-02 DIAGNOSIS — K2990 Gastroduodenitis, unspecified, without bleeding: Secondary | ICD-10-CM | POA: Diagnosis not present

## 2024-09-02 DIAGNOSIS — K219 Gastro-esophageal reflux disease without esophagitis: Secondary | ICD-10-CM | POA: Diagnosis not present

## 2024-09-02 LAB — BASIC METABOLIC PANEL WITH GFR
Anion gap: 14 (ref 5–15)
BUN: 11 mg/dL (ref 8–23)
CO2: 19 mmol/L — ABNORMAL LOW (ref 22–32)
Calcium: 8.6 mg/dL — ABNORMAL LOW (ref 8.9–10.3)
Chloride: 101 mmol/L (ref 98–111)
Creatinine, Ser: 0.69 mg/dL (ref 0.44–1.00)
GFR, Estimated: >60 mL/min (ref >60)
Glucose, Bld: 81 mg/dL (ref 70–99)
Potassium: 4.4 mmol/L (ref 3.5–5.1)
Sodium: 134 mmol/L — ABNORMAL LOW (ref 135–145)

## 2024-09-02 LAB — GLUCOSE, CAPILLARY
Glucose-Capillary: 110 mg/dL — ABNORMAL HIGH (ref 70–99)
Glucose-Capillary: 100 mg/dL — ABNORMAL HIGH (ref 70–99)
Glucose-Capillary: 148 mg/dL — ABNORMAL HIGH (ref 70–99)
Glucose-Capillary: 77 mg/dL (ref 70–99)

## 2024-09-02 NOTE — Plan of Care (Signed)
  Problem: Education: Goal: Ability to describe self-care measures that may prevent or decrease complications (Diabetes Survival Skills Education) will improve Outcome: Completed/Met Goal: Individualized Educational Video(s) Outcome: Not Applicable   

## 2024-09-02 NOTE — TOC CM/SW Note (Addendum)
 Transition of Care [facility]) - Inpatient Brief Assessment   Patient Details  Name: [name] [name] MRN: [id number] Date of Birth: Dec 22, 1960  Transition of Care [facility]) CM/SW Contact:    Tom-Johnson, Tamer Baughman Daphne, RN Phone Number: 09/02/2024, 10:28 AM   Clinical Narrative:  Patient presented to the ED with Abdominal pain, Intractable N/V, Generalized Weakness and Loss of Balance with a Fall. CT Abdomen/Pelvis showed Gastroduodenitis.  Patient has hx of T1DM on Insulin  Pump, IBS, GERD, Hypothyroidism, HTN, HLD, CHF, Osteoporosis.  On IV Fluid Repletion, Lovenox .    CM spoke with patient at bedside about needs for post hospital transition. Patient lives with her husband, has two children. Retired, independent with her care and able to drive self. Has a cane, walker and shower seat at home. PCP is Arlyss Eugene BRAVO, NP and uses [facility] [address].  No ICM needs or recommendations noted at this time.  Patient not Medically ready for discharge.  CM will continue to follow as patient progresses with care towards discharge.           Transition of Care Asessment: Insurance and Status: Insurance coverage has been reviewed Patient has primary care physician: Yes Home environment has been reviewed: Yes Prior level of function:: Modified Independent Prior/Current Home Services: No current home services Social Drivers of Health Review: SDOH reviewed no interventions necessary Readmission risk has been reviewed: Yes Transition of care needs: no transition of care needs at this time

## 2024-09-02 NOTE — Progress Notes (Signed)
 Progress Note   Patient: [name] [name] FMW:[id number] DOB: 12-Dec-1960 DOA: 08/31/2024     1 DOS: the patient was seen and examined on 09/02/2024   Brief hospital course: [name] [name] is a 63 y.o. female with medical history significant of insulin -dependent diabetes, GERD, irritable bowel syndrome, hypothyroidism, essential hypertension, hyperlipidemia, osteoporosis, diastolic dysfunction CHF among other things who presented to the ER with abdominal pain and intractable nausea vomiting.  Patient has also been weak and losing balance.  She fell last night around 9:00 and hit her head.  Patient is not on any blood thinners.  Patient has continued to have significant nausea and vomiting in the ER.  Despite multiple treatments with Pepcid , Zofran  and supportive care she continues to have the nausea and vomiting.  Also have the abdominal pain which is persistent.  CT abdomen pelvis showed gastroduodenitis. She is admitted to the hospitalist service for further management.   Assessment and Plan: Intractable nausea and vomiting- Gastroduodenitis- Nausea persists, able to tolerate clears. Continue gentle IV fluids. Advance to full liquids today. Continue antiemetics as needed.  Hypokalemia- Due to GI losses. K improved with repletion.  Cannabis abuse- Prior admissions reviewed- she uses Cannabis. Discussed to quit illicit drug use. Urine drug screen ordered.  Chronic diastolic CHF No signs of decompensation. Not on Lasix  at home.   Uncontrolled Type 2 diabetes- A1c 8.4 two months ago. Continue accucheks, sliding scale insulin . She uses Omnipod, toujeo .      Out of bed to chair. Incentive spirometry. Nursing supportive care. Fall, aspiration precautions. Diet:  Diet Orders (From admission, onward)     Start     Ordered   09/02/24 1114  Diet full liquid Room service appropriate? Yes; Fluid consistency: Thin  Diet effective now       Question Answer Comment  Room service  appropriate? Yes   Fluid consistency: Thin      09/02/24 1113           DVT prophylaxis: enoxaparin  (LOVENOX ) injection 40 mg Start: 08/31/24 2100 SCDs Start: 08/31/24 2051  Level of care: Med-Surg   Code Status: Full Code  Subjective: Patient is seen and examined today morning. She feels nauseous. Able to tolerate clears. Epigastric pain better. Advised out of bed.  Physical Exam: Vitals:   09/01/24 1621 09/01/24 2035 09/02/24 0410 09/02/24 0753  BP: (!) 159/86 117/70 133/77 (!) 141/79  Pulse: 91 86 87 89  Resp: 18 20 20 18   Temp: 97.9 F (36.6 C) 98.4 F (36.9 C) 98.1 F (36.7 C) 98.5 F (36.9 C)  TempSrc: Oral Oral Oral Oral  SpO2: 98%  99% 96%  Weight:      Height:        General -  Middle aged Caucasian female, distress due to nausea HEENT - PERRLA, EOMI, atraumatic head, non tender sinuses. Lung - Clear, no rales, rhonchi, wheezes. Heart - S1, S2 heard, no murmurs, rubs, trace pedal edema. Abdomen - Soft, epigastric abdomen tender, bowel sounds good Neuro - Alert, awake and oriented x 3, non focal exam. Skin - Warm and dry.  Data Reviewed:      Latest Ref Rng & Units 09/01/2024    3:40 AM 08/31/2024    5:12 PM 08/31/2024    9:47 AM  CBC  WBC 4.0 - 10.5 K/uL 10.4   13.9   Hemoglobin 12.0 - 15.0 g/dL 87.6  86.6  85.1   Hematocrit 36.0 - 46.0 % 36.9  39.0  44.4   Platelets  150 - 400 K/uL 314   407       Latest Ref Rng & Units 09/02/2024    3:58 AM 09/01/2024    3:40 AM 08/31/2024    5:12 PM  BMP  Glucose 70 - 99 mg/dL 81  877    BUN 8 - 23 mg/dL 11  13    Creatinine 9.55 - 1.00 mg/dL 9.30  9.20    Sodium 864 - 145 mmol/L 134  138  137   Potassium 3.5 - 5.1 mmol/L 4.4  3.4  3.9   Chloride 98 - 111 mmol/L 101  103    CO2 22 - 32 mmol/L 19  25    Calcium  8.9 - 10.3 mg/dL 8.6  8.6     No results found.   Family Communication: Discussed with patient, understand and agree. All questions answered.  Disposition: Status is: Inpatient Remains  inpatient appropriate because: IV hydration, advance diet, IV antiemetics.  Planned Discharge Destination: Home     Time spent: 43 minutes  Author: Concepcion Riser, MD 09/02/2024 3:30 PM Secure chat 7am to 7pm For on call review [email or web address].

## 2024-09-02 NOTE — TOC Initial Note (Signed)
 Transition of Care [facility]) - Initial/Assessment Note    Patient Details  Name: [name] [name] MRN: [id number] Date of Birth: 03-25-1961  Transition of Care [facility]) CM/SW Contact:    Lendia Dais, LCSWA Phone Number: 09/02/2024, 4:24 PM  Clinical Narrative:  CSW spoke to pt at bedside.  Pt is from home with husband, uses a rollator, cane, dexcom, and insulin  pump at baseline. Pt is able to drive outside the hospital and husband provides all financial needs, no SDOH concerns. Pt takes meds as prescribed and has no hx of MH/SU.   No current TOC needs.           Expected Discharge Plan: Home/Self Care Barriers to Discharge: Continued Medical Work up   Patient Goals and CMS Choice Patient states their goals for this hospitalization and ongoing recovery are:: Return home   Choice offered to / list presented to : NA      Expected Discharge Plan and Services In-house Referral: Clinical Social Work     Living arrangements for the past 2 months: Single Family Home                                      Prior Living Arrangements/Services Living arrangements for the past 2 months: Single Family Home Lives with:: Spouse Patient language and need for interpreter reviewed:: Yes Do you feel safe going back to the place where you live?: Yes      Need for Family Participation in Patient Care: Yes (Comment) Care giver support system in place?: Yes (comment) Current home services: DME (Rollator, Cane, dexcom) Criminal Activity/Legal Involvement Pertinent to Current Situation/Hospitalization: No - Comment as needed  Activities of Daily Living   ADL Screening (condition at time of admission) Independently performs ADLs?: Yes (appropriate for developmental age) Is the patient deaf or have difficulty hearing?: No Does the patient have difficulty seeing, even when wearing glasses/contacts?: No  Permission Sought/Granted Permission sought to share information with : Family  Supports Permission granted to share information with : Yes, Verbal Permission Granted  Share Information with NAME: Holliday Sheaffer     Permission granted to share info w Relationship: Spouse  Permission granted to share info w Contact Information: [phone]  Emotional Assessment Appearance:: Appears stated age Attitude/Demeanor/Rapport: Engaged Affect (typically observed): Pleasant, Appropriate Orientation: : Oriented to Situation, Oriented to Self, Oriented to Place, Oriented to  Time Alcohol / Substance Use: Not Applicable Psych Involvement: No (comment)  Admission diagnosis:  Gastroduodenitis [K29.90] Patient Active Problem List   Diagnosis Date Noted   Gastroduodenitis 08/31/2024   Syncope 07/24/2024   Closed left fibular fracture 07/24/2024   Abdominal pain 07/18/2024   Pyelonephritis 05/28/2024   Insulin  pump in place 04/28/2024   Acute pancreatitis 01/21/2024   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   Polycythemia 01/05/2024   Diabetic ketoacidosis associated with type 1 diabetes mellitus ([name]) 12/07/2023   Depression 11/12/2023   Intractable abdominal pain 10/26/2023   Gall bladder polyp 10/25/2023   Peptic ulcer disease 10/25/2023   Bile duct abnormality 09/24/2023   Gastritis and gastroduodenitis 09/24/2023   Multiple duodenal ulcers 09/24/2023   Esophagitis 09/23/2023   Abdominal pain, bilateral upper quadrant 09/23/2023   Intractable nausea 09/22/2023   Hyperglycemia 09/02/2023   DKA (diabetic ketoacidosis) ([name]) 08/01/2023   Intractable vomiting with nausea 06/14/2023   non obstructive CAD (coronary artery disease) 05/14/2023   Urinary retention 05/14/2023   Diabetes mellitus type  1 ([name]) 05/14/2023   UTI (urinary tract infection) 05/14/2023   Abnormal CT scan, esophagus 02/05/2023   ARF (acute renal failure) 02/03/2023   Gastroparesis 01/07/2023   History of hypotension 10/18/2022   Metabolic alkalosis 10/18/2022   Dyslipidemia 10/18/2022   Chronic pain  of both knees 08/20/2022   Intractable nausea and vomiting 03/27/2022   Marijuana abuse 03/27/2022   Bladder wall thickening 03/09/2021   Chronic orthostatic hypotension 03/06/2021   Gastric erosion    Urinary frequency 02/10/2020   Chronic diastolic HF (heart failure) ([name]) 10/12/2019   Duodenal ulcer without hemorrhage or perforation    Acute esophagitis    Duodenitis    Constipation    Nausea & vomiting 03/25/2019   MDD (major depressive disorder), recurrent episode, moderate ([name]) 07/01/2018   Dehydration 05/26/2018   Colitis 03/07/2018   Chronic chest pain    Noncompliance 01/14/2018   Migraines 12/11/2017   Phrygian cap 06/20/2017   Leukocytosis    Hypokalemia 02/15/2017   NSTEMI (non-ST elevated myocardial infarction) ([name]) 02/11/2017   Paroxysmal A-fib ([name]) 02/01/2015   Kidney disease, chronic, 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 [place] [address] Phone: [phone] Fax: [phone]     Social Drivers of Health (SDOH) Social History: SDOH Screenings   Food Insecurity: No Food Insecurity (08/31/2024)  Housing: Low Risk  (08/31/2024)  Transportation Needs: No Transportation Needs (08/31/2024)  Utilities: Not At Risk (08/31/2024)  Depression (PHQ2-9): High Risk (01/24/2022)  Financial Resource Strain: Low Risk  (04/25/2024)   Received from [facility]  Physical Activity: Unknown (04/25/2024)   Received from [facility]  Social Connections: Moderately Isolated (07/18/2024)  Stress: No Stress Concern Present (04/25/2024)   Received from [facility]  Tobacco Use: Medium Risk (08/31/2024)   SDOH  Interventions: Transportation Interventions: Inpatient TOC, Intervention Not Indicated, Patient Resources (Friends/Family)   Readmission Risk Interventions    09/02/2024   10:27 AM 07/27/2024   12:00 PM 05/31/2024   11:30 AM  Readmission Risk Prevention Plan  Transportation Screening Complete Complete Complete  Medication Review (RN Care Manager) Referral to Pharmacy Referral to Pharmacy Complete  PCP or Specialist appointment within 3-5 days of discharge Complete Complete Complete  HRI or Home Care Consult Complete Complete Complete  SW Recovery Care/Counseling Consult Complete Complete Complete  Palliative Care Screening Not Applicable Not Applicable Not Applicable  Skilled Nursing Facility Not Applicable Not Applicable Not Applicable

## 2024-09-03 DIAGNOSIS — E039 Hypothyroidism, unspecified: Secondary | ICD-10-CM | POA: Diagnosis not present

## 2024-09-03 DIAGNOSIS — K219 Gastro-esophageal reflux disease without esophagitis: Secondary | ICD-10-CM | POA: Diagnosis not present

## 2024-09-03 DIAGNOSIS — K2990 Gastroduodenitis, unspecified, without bleeding: Secondary | ICD-10-CM | POA: Diagnosis not present

## 2024-09-03 DIAGNOSIS — R112 Nausea with vomiting, unspecified: Secondary | ICD-10-CM | POA: Diagnosis not present

## 2024-09-03 LAB — BASIC METABOLIC PANEL WITH GFR
Anion gap: 14 (ref 5–15)
BUN: 11 mg/dL (ref 8–23)
CO2: 21 mmol/L — ABNORMAL LOW (ref 22–32)
Calcium: 8.9 mg/dL (ref 8.9–10.3)
Chloride: 99 mmol/L (ref 98–111)
Creatinine, Ser: 0.74 mg/dL (ref 0.44–1.00)
GFR, Estimated: >60 mL/min (ref >60)
Glucose, Bld: 122 mg/dL — ABNORMAL HIGH (ref 70–99)
Potassium: 4.1 mmol/L (ref 3.5–5.1)
Sodium: 134 mmol/L — ABNORMAL LOW (ref 135–145)

## 2024-09-03 LAB — GLUCOSE, CAPILLARY
Glucose-Capillary: 125 mg/dL — ABNORMAL HIGH (ref 70–99)
Glucose-Capillary: 186 mg/dL — ABNORMAL HIGH (ref 70–99)
Glucose-Capillary: 133 mg/dL — ABNORMAL HIGH (ref 70–99)
Glucose-Capillary: 151 mg/dL — ABNORMAL HIGH (ref 70–99)

## 2024-09-03 NOTE — Progress Notes (Signed)
 Progress Note   Patient: [name] [name] FMW:[id number] DOB: 1961-09-04 DOA: 08/31/2024     2 DOS: the patient was seen and examined on 09/03/2024   Brief hospital course: [name] [name] is a 63 y.o. female with medical history significant of insulin -dependent diabetes, GERD, irritable bowel syndrome, hypothyroidism, essential hypertension, hyperlipidemia, osteoporosis, diastolic dysfunction CHF among other things who presented to the ER with abdominal pain and intractable nausea vomiting.  Patient has also been weak and losing balance.  She fell last night around 9:00 and hit her head.  Patient is not on any blood thinners.  Patient has continued to have significant nausea and vomiting in the ER.  Despite multiple treatments with Pepcid , Zofran  and supportive care she continues to have the nausea and vomiting.  Also have the abdominal pain which is persistent.  CT abdomen pelvis showed gastroduodenitis. She is admitted to the hospitalist service for further management.   Assessment and Plan: Intractable nausea and vomiting- Gastroduodenitis- Nausea persists, able to tolerate full liquids. Continue gentle IV fluids. Advance to soft diet today. Continue antiemetics as needed.  Hypokalemia- Due to GI losses. K improved with repletion.  Hyponatremia- seems chronic. Due to poor oral intake. Trend Na.  Cannabis abuse- Prior admissions reviewed- she uses Cannabis.  Discussed to quit illicit drug use. Urine drug screen positive for THC.  Chronic diastolic CHF No signs of decompensation. Not on Lasix  at home.   Uncontrolled Type 2 diabetes- A1c 8.4 two months ago. Continue accucheks, sliding scale insulin . She uses Omnipod, toujeo  at home. Hypoglycemia protocol.      Out of bed to chair. Incentive spirometry. Nursing supportive care. Fall, aspiration precautions. Diet:  Diet Orders (From admission, onward)     Start     Ordered   09/03/24 1421  DIET SOFT Room service  appropriate? Yes; Fluid consistency: Thin  Diet effective now       Question Answer Comment  Room service appropriate? Yes   Fluid consistency: Thin      09/03/24 1420           DVT prophylaxis: enoxaparin  (LOVENOX ) injection 40 mg Start: 08/31/24 2100 SCDs Start: 08/31/24 2051  Level of care: Med-Surg   Code Status: Full Code  Subjective: Patient is seen and examined today morning. Able to tolerate liquids but today feels more nauseous. Epigastric pain better. Advised to slowly advance diet.  Physical Exam: Vitals:   09/02/24 0753 09/02/24 1928 09/03/24 0451 09/03/24 0800  BP: (!) 141/79 130/82 119/78 (!) 153/79  Pulse: 89 73 77 84  Resp: 18 18 18 18   Temp: 98.5 F (36.9 C) 98.3 F (36.8 C) 98 F (36.7 C) 97.9 F (36.6 C)  TempSrc: Oral Oral Oral Oral  SpO2: 96% 99% 99% 100%  Weight:      Height:        General -  Middle aged Caucasian female, distress due to nausea HEENT - PERRLA, EOMI, atraumatic head, non tender sinuses. Lung - Clear, no rales, rhonchi, wheezes. Heart - S1, S2 heard, no murmurs, rubs, trace pedal edema. Abdomen - Soft, epigastric abdomen tender, bowel sounds good Neuro - Alert, awake and oriented x 3, non focal exam. Skin - Warm and dry.  Data Reviewed:      Latest Ref Rng & Units 09/01/2024    3:40 AM 08/31/2024    5:12 PM 08/31/2024    9:47 AM  CBC  WBC 4.0 - 10.5 K/uL 10.4   13.9   Hemoglobin 12.0 - 15.0  g/dL 87.6  86.6  85.1   Hematocrit 36.0 - 46.0 % 36.9  39.0  44.4   Platelets 150 - 400 K/uL 314   407       Latest Ref Rng & Units 09/03/2024    4:36 AM 09/02/2024    3:58 AM 09/01/2024    3:40 AM  BMP  Glucose 70 - 99 mg/dL 877  81  877   BUN 8 - 23 mg/dL 11  11  13    Creatinine 0.44 - 1.00 mg/dL 9.25  9.30  9.20   Sodium 135 - 145 mmol/L 134  134  138   Potassium 3.5 - 5.1 mmol/L 4.1  4.4  3.4   Chloride 98 - 111 mmol/L 99  101  103   CO2 22 - 32 mmol/L 21  19  25    Calcium  8.9 - 10.3 mg/dL 8.9  8.6  8.6    No results  found.   Family Communication: Discussed with patient, understand and agree. All questions answered.  Disposition: Status is: Inpatient Remains inpatient appropriate because: advance diet, IV antiemetics.  Planned Discharge Destination: Home     Time spent: 42 minutes  Author: Concepcion Riser, MD 09/03/2024 3:25 PM Secure chat 7am to 7pm For on call review [email or web address].

## 2024-09-03 NOTE — Plan of Care (Signed)
   Problem: Coping: Goal: Ability to adjust to condition or change in health will improve Outcome: Progressing   Problem: Fluid Volume: Goal: Ability to maintain a balanced intake and output will improve Outcome: Progressing   Problem: Health Behavior/Discharge Planning: Goal: Ability to identify and utilize available resources and services will improve Outcome: Progressing

## 2024-09-03 NOTE — Progress Notes (Signed)
   09/03/24 1050  Spiritual Encounters  Type of Visit Initial  Care provided to: Patient  Reason for visit Advance directives  OnCall Visit No    Chaplain responded to spiritual care consult for advance directives. Paperwork/information provided and all questions answered at this time. Chaplains remain available as further needs arise.

## 2024-09-04 DIAGNOSIS — F1210 Cannabis abuse, uncomplicated: Secondary | ICD-10-CM

## 2024-09-04 LAB — GLUCOSE, CAPILLARY
Glucose-Capillary: 159 mg/dL — ABNORMAL HIGH (ref 70–99)
Glucose-Capillary: 158 mg/dL — ABNORMAL HIGH (ref 70–99)

## 2024-09-04 MED ORDER — ACETAMINOPHEN 325 MG PO TABS
650.0000 mg | ORAL_TABLET | Freq: Four times a day (QID) | ORAL | Status: DC | PRN
  Administered 2024-09-04: 650 mg via ORAL
  Filled 2024-09-04: qty 2

## 2024-09-04 NOTE — Discharge Summary (Signed)
 Physician Discharge Summary   Patient: [name] [name] MRN: [id number] DOB: 1961-12-03  Admit date:     08/31/2024  Discharge date: 09/04/2024  Discharge Physician: [name] [name]   PCP: [name] Eugene BRAVO, NP   Recommendations at discharge:    PCP follow up in 1 week. GI follow up as scheduled.  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 *  Hospital Course: [name] [name] is a 63 y.o. female with medical history significant of insulin -dependent diabetes, GERD, irritable bowel syndrome, hypothyroidism, essential hypertension, hyperlipidemia, osteoporosis, diastolic dysfunction CHF among other things who presented to the ER with abdominal pain and intractable nausea vomiting.  Patient has also been weak and losing balance.  She fell last night around 9:00 and hit her head.  Patient is not on any blood thinners.  Patient has continued to have significant nausea and vomiting in the ER.  Despite multiple treatments with Pepcid , Zofran  and supportive care she continues to have the nausea and vomiting.  Also have the abdominal pain which is persistent.  CT abdomen pelvis showed gastroduodenitis. She is admitted to the hospitalist service for further management.    Assessment and Plan: Intractable nausea and vomiting- Gastroduodenitis- Nausea improved with antiemetics She got IV hydration while in hospital. Diet advanced to soft diet. Continue antiemetics as needed, PPI upon discharge. She understands to follow PCP, GI upon discharge. Discussed to quit marijuana.   Hypokalemia- Due to GI losses. K  improved with repletion.   Hyponatremia- Due to poor oral intake. Stable.   Cannabis abuse- Prior admissions reviewed- she uses Cannabis.  Urine drug screen positive for THC. Discussed to quit illicit drug use.   Chronic diastolic CHF No signs of decompensation. Not on Lasix  at home.    Uncontrolled Type 2 diabetes- A1c 8.4 two months ago. Continue accucheks, sliding scale insulin . She uses Omnipod, toujeo  at home.        Consultants: none Procedures performed: none  Disposition: Home Diet recommendation:  Discharge Diet Orders (From admission, onward)     Start     Ordered   09/04/24 0000  Diet - low sodium heart healthy        09/04/24 1047   09/04/24 0000  Diet Carb Modified        09/04/24 1047           Cardiac and Carb modified diet DISCHARGE MEDICATION: Allergies as of 09/04/2024       Reactions   Ciprofloxacin  Diarrhea, Nausea Only, Other (See Comments)   Caused extreme nausea   Codeine Hives, Anxiety   Flagyl  [metronidazole ] Nausea Only, Other (See Comments)   Can tolerate ONLY VIA IV (otherwise, SEVERE NAUSEA)        Medication List     TAKE these medications    aspirin  EC 81 MG tablet Take 81 mg by mouth daily. Swallow whole.   gabapentin  100 MG capsule Commonly known as: NEURONTIN  Take 100 mg by mouth at bedtime.   Glucagon Emergency 1 MG Kit Inject 1 mg into the skin See admin instructions. Inject one mg into the skin see administration instructions. Follow package  directions for low blood sugar.   levothyroxine  150 MCG tablet Commonly known as: SYNTHROID  Take 1 tablet (150 mcg total) by mouth daily before breakfast.   metoCLOPramide  10 MG tablet Commonly known as: REGLAN  Take 1 tablet (10 mg total) by mouth every 6 (six) hours.   omega-3 acid ethyl esters 1 g capsule Commonly known as: LOVAZA  Take 2 capsules (2 g total) by mouth daily as needed (for circulation).   Omnipod 5 G7 Pods (Gen 5) Misc Inject 1 Device into the  skin every 3 (three) days.   ondansetron  4 MG disintegrating tablet Commonly known as: ZOFRAN -ODT Take 1 tablet (4 mg total) by mouth every 8 (eight) hours as needed for nausea.   ondansetron  4 MG tablet Commonly known as: ZOFRAN  Take 2 tablets (8 mg total) by mouth every 8 (eight) hours as needed for nausea or vomiting.   pantoprazole  40 MG tablet Commonly known as: PROTONIX  Take 1 tablet (40 mg total) by mouth daily. What changed: when to take this   Toujeo  SoloStar 300 UNIT/ML Solostar Pen Generic drug: insulin  glargine (1 Unit Dial ) Inject 25 Units into the skin See admin instructions. Inject 25 units into the skin at bedtime ONLY WHEN NOT USING THE OMNIPOD        Discharge Exam: Filed Weights   08/31/24 0944  Weight: 52.6 kg      09/04/2024    7:36 AM 09/04/2024    4:36 AM 09/03/2024    8:51 PM  Vitals with BMI  Systolic 118 95 106  Diastolic 75 71 70  Pulse 88 82 73    General -  Middle aged Caucasian female, no distress HEENT - PERRLA, EOMI, atraumatic head, non tender sinuses. Lung - Clear, no rales, rhonchi, wheezes. Heart - S1, S2 heard, no murmurs, rubs, trace pedal edema. Abdomen - Soft, non tender, bowel sounds good Neuro - Alert, awake and oriented x 3, non focal exam. Skin - Warm and dry.  Condition at discharge: stable  The results of significant diagnostics from this hospitalization (including imaging, microbiology, ancillary and laboratory) are listed below for reference.   Imaging Studies: CT ABDOMEN PELVIS W CONTRAST Result Date: 08/31/2024 CLINICAL DATA:  Abdominal pain, evaluate for obstruction or perforation EXAM: CT ABDOMEN AND PELVIS WITH CONTRAST TECHNIQUE: Multidetector CT imaging of the abdomen and pelvis was performed using the standard protocol following bolus administration of intravenous contrast. RADIATION DOSE REDUCTION: This exam was performed according to the departmental dose-optimization program which includes automated exposure  control, adjustment of the mA and/or kV according to patient size and/or use of iterative reconstruction technique. CONTRAST:  75mL OMNIPAQUE  IOHEXOL  350 MG/ML SOLN COMPARISON:  CT abdomen pelvis August 08, 2024 FINDINGS: Lower chest: No acute abnormality. Hepatobiliary: Elongated right hepatic lobe measuring 19.4 cm in craniocaudal dimension. An enhancing nodule in left hepatic lobe measuring 9 mm likely a flash filling hemangioma. Focal fat in segment 4 typical location measuring 1.4 cm. Cholecystectomy. Common bile duct is dilated up to 9.4 mm which can be seen the setting of post cholecystectomy. Pancreas: Atrophic changes of the pancreas. Accessory duct of Santorini, normal variation (query ansa pancreatica?). Evaluation is suboptimal on current nondedicated CT. No ductal dilatation or mass lesion. Spleen: Normal in size without focal abnormality. Adrenals/Urinary Tract: Adrenal glands are unremarkable. Kidneys are normal, without renal calculi, focal lesion, or hydronephrosis. Bladder is thick-walled and trabeculated with few left posterolateral wall wide mouth diverticula. Stomach/Bowel: Prominent gastric and duodenal folds suggestive of gastroduodenitis. Colonic diverticulosis without diverticulitis.  Normal appendix. No bowel obstruction or evidence of perforation. Small bowel loops are normal. Vascular/Lymphatic: Atherosclerotic calcifications of aorta and branches. Reproductive: Hysterectomy.  No adnexal mass. Other: Small fat containing umbilical/paraumbilical hernia. Musculoskeletal: Mild degenerative changes of the spine. IMPRESSION: Hepatic steatosis. Left hepatic lobe enhancing lesion stable to prior most consistent with flash filling hemangioma. Prominent gastroduodenal folds suggestive of gastroduodenitis. No bowel obstruction or perforation. Cholecystectomy and extrahepatic biliary dilatation (reservoir phenomenon) Remainder of the findings are stable to prior. Electronically Signed   By: Megan   Zare M.D.   On: 08/31/2024 15:24   CT Head Wo Contrast Result Date: 08/31/2024 CLINICAL DATA:  Fall with head trauma. EXAM: CT HEAD WITHOUT CONTRAST TECHNIQUE: Contiguous axial images were obtained from the base of the skull through the vertex without intravenous contrast. RADIATION DOSE REDUCTION: This exam was performed according to the departmental dose-optimization program which includes automated exposure control, adjustment of the mA and/or kV according to patient size and/or use of iterative reconstruction technique. COMPARISON:  07/24/2024 FINDINGS: Brain: Ventricles, cisterns and other CSF spaces are normal. There is no mass, mass effect, shift of midline structures or acute hemorrhage. No acute infarction. Vascular: No hyperdense vessel or unexpected calcification. Skull: Normal. Negative for fracture or focal lesion. Sinuses/Orbits: No acute finding. Other: None. IMPRESSION: No acute findings. Electronically Signed   By: Toribio Agreste M.D.   On: 08/31/2024 15:07   LONG TERM MONITOR XT (3-14 DAYS) Result Date: 08/21/2024 Zio patch monitor 13 days 07/31/2024 - 08/14/2024: Dominant rhythm: Sinus. HR 66-138 bpm. Avg HR 87 bpm, in sinus rhythm. 2 episodes of atrial tachycardia, fastest at 194 bpm for 5 beats, longest for 12 beats at 149 bpm. <1% isolated SVE,  couplet/triplets. 0 episodes of VT. <1% isolated VE, couplet/triplets. No atrial fibrillation/atrial flutter/VT/high grade AV block, sinus pause >3sec noted. 17 patient triggered events, correlated with sinus rhythm, with or without SVE/VE.   DG Chest 2 View Result Date: 08/14/2024 EXAM: 2 VIEW(S) XRAY OF THE CHEST 08/14/2024 06:51:48 PM COMPARISON: 01/10/2024. Normal. CLINICAL HISTORY: Chest pain. FINDINGS: LUNGS AND PLEURA: No focal pulmonary opacity. No pulmonary edema. No pleural effusion. No pneumothorax. HEART AND MEDIASTINUM: No acute abnormality of the cardiac and mediastinal silhouettes. BONES AND SOFT TISSUES: No acute osseous abnormality.  IMPRESSION: 1. No acute process. Electronically signed by: Norman Gatlin MD 08/14/2024 06:58 PM EDT RP Workstation: [id number]   CT ABDOMEN PELVIS W CONTRAST Result Date: 08/08/2024 CLINICAL DATA:  Acute abdominal pain. EXAM: CT ABDOMEN AND PELVIS WITH CONTRAST TECHNIQUE: Multidetector CT imaging of the abdomen and pelvis was performed using the standard protocol following bolus administration of intravenous contrast. RADIATION DOSE REDUCTION: This exam was performed according to the departmental dose-optimization program which includes automated exposure control, adjustment of the mA and/or kV according to patient size and/or use of iterative reconstruction technique. CONTRAST:  OMNIPAQUE  IOHEXOL  300 MG/ML  SOLN COMPARISON:  Most recent CT 07/18/2024 FINDINGS: Lower chest: Clear lung bases. Hepatobiliary: Mild hepatic steatosis. No focal liver abnormality. Stable biliary tree post cholecystectomy. Pancreas: No ductal dilatation or inflammation. Spleen: Normal in size without focal abnormality. Adrenals/Urinary Tract: No adrenal nodule. No hydronephrosis or renal inflammation. No renal calculi. Moderate bladder distension with equivocal wall thickening about the dome. Stomach/Bowel: Wall thickening of the distal esophagus. Fluid within the stomach with accentuation of rugal folds and mild pre pyloric gastric wall thickening. Similar findings were seen on prior exam. Decompressed small bowel. Majority of the colon is nondistended. Diffuse colonic wall thickening versus nondistention.  Multiple colonic diverticula without focal diverticulitis. Normal appendix visualized. Vascular/Lymphatic: No acute vascular findings. Mild aortic atherosclerosis. Patent portal vein. No enlarged lymph nodes in the abdomen or pelvis. Reproductive: Status post hysterectomy. No adnexal masses. Other: No free air, free fluid, or intra-abdominal fluid collection. Small fat containing umbilical hernia. Musculoskeletal: There are no  acute or suspicious osseous abnormalities. IMPRESSION: 1. Wall thickening of the distal esophagus, can be seen with reflux or esophagitis. 2. Fluid within the stomach with accentuation of rugal folds and mild pre pyloric gastric wall thickening, similar to prior exam. Findings may represent gastritis or peptic ulcer disease. 3. Diffuse colonic wall thickening versus nondistention. Recommend clinical correlation for colitis. 4. Colonic diverticulosis without focal diverticulitis. 5. Moderate bladder distension with equivocal wall thickening about the dome, potentially cystitis. 6. Mild hepatic steatosis. Aortic Atherosclerosis (ICD10-I70.0). Electronically Signed   By: Andrea Gasman M.D.   On: 08/08/2024 16:27    Microbiology: Results for orders placed or performed during the hospital encounter of 07/23/24  Culture, OB Urine     Status: Abnormal   Collection Time: 07/24/24 12:25 PM   Specimen: Urine, Random  Result Value Ref Range Status   Specimen Description URINE, RANDOM  Final   Special Requests NONE  Final   Culture (A)  Final    10,000 COLONIES/mL STAPHYLOCOCCUS HAEMOLYTICUS NO GROUP B STREP (S.AGALACTIAE) ISOLATED Performed at [facility], [address], [place], [place] [address]    Report Status 07/27/2024 FINAL  Final   Organism ID, Bacteria STAPHYLOCOCCUS HAEMOLYTICUS (A)  Final      Susceptibility   Staphylococcus haemolyticus - MIC*    CIPROFLOXACIN  <=0.5 SENSITIVE Sensitive     GENTAMICIN 1 SENSITIVE Sensitive     NITROFURANTOIN  <=16 SENSITIVE Sensitive     OXACILLIN >=4 RESISTANT Resistant     TETRACYCLINE  >=16 RESISTANT Resistant     VANCOMYCIN  1 SENSITIVE Sensitive     TRIMETH /SULFA  <=10 SENSITIVE Sensitive     RIFAMPIN <=0.5 SENSITIVE Sensitive     Inducible Clindamycin NEGATIVE Sensitive     * 10,000 COLONIES/mL STAPHYLOCOCCUS HAEMOLYTICUS    Labs: CBC: Recent Labs  Lab 08/31/24 0947 08/31/24 1712 09/01/24 0340  WBC 13.9*  --  10.4  HGB 14.8 13.3  12.3  HCT 44.4 39.0 36.9  MCV 84.6  --  85.0  PLT 407*  --  314   Basic Metabolic Panel: Recent Labs  Lab 08/31/24 0947 08/31/24 1712 09/01/24 0340 09/02/24 0358 09/03/24 0436  NA 134* 137 138 134* 134*  K 4.0 3.9 3.4* 4.4 4.1  CL 96*  --  103 101 99  CO2 20*  --  25 19* 21*  GLUCOSE 338*  --  122* 81 122*  BUN 19  --  13 11 11   CREATININE 0.72  --  0.79 0.69 0.74  CALCIUM  9.4  --  8.6* 8.6* 8.9   Liver Function Tests: Recent Labs  Lab 08/31/24 0947 09/01/24 0340  AST 20 12*  ALT 10 10  ALKPHOS 84 60  BILITOT 0.9 0.8  PROT 7.7 5.8*  ALBUMIN 4.3 3.1*   CBG: Recent Labs  Lab 09/03/24 0759 09/03/24 1112 09/03/24 1659 09/03/24 2053 09/04/24 0734  GLUCAP 125* 186* 133* 151* 159*    Discharge time spent: 34 minutes.  Signed: Concepcion Riser, MD [facility] 09/04/2024

## 2024-09-04 NOTE — Plan of Care (Signed)
   Problem: Coping: Goal: Ability to adjust to condition or change in health will improve Outcome: Progressing   Problem: Fluid Volume: Goal: Ability to maintain a balanced intake and output will improve Outcome: Progressing

## 2024-09-04 NOTE — TOC Transition Note (Signed)
 Transition of Care [facility]) - Discharge Note   Patient Details  Name: [name] [name] MRN: [id number] Date of Birth: 10/11/1961  [facility]) CM/SW Contact:  Tom-Johnson, Rashika Bettes Daphne, RN Phone Number: 09/04/2024, 11:30 AM   Clinical Narrative:     Patient is scheduled for discharge today.  Readmission Risk Assessment done. Hospital f/u and discharge instructions on AVS. No ICM needs or recommendations noted. Husband, [name] to transport at discharge.  No further ICM needs noted.       Final next level of care: Home/Self Care Barriers to Discharge: Barriers Resolved   Patient Goals and CMS Choice Patient states their goals for this hospitalization and ongoing recovery are:: To return home CMS Medicare.gov Compare Post Acute Care list provided to:: Patient Choice offered to / list presented to : NA      Discharge Placement                Patient to be transferred to facility by: Husband Name of family member notified: [name]    Discharge Plan and Services Additional resources added to the After Visit Summary for   In-house Referral: Clinical Social Work              DME Arranged: N/A DME Agency: NA       HH Arranged: NA HH Agency: NA        Social Drivers of Health (SDOH) Interventions SDOH Screenings   Food Insecurity: No Food Insecurity (08/31/2024)  Housing: Low Risk  (08/31/2024)  Transportation Needs: No Transportation Needs (08/31/2024)  Utilities: Not At Risk (08/31/2024)  Depression (PHQ2-9): High Risk (01/24/2022)  Financial Resource Strain: Low Risk  (04/25/2024)   Received from [facility]  Physical Activity: Unknown (04/25/2024)   Received from [facility]  Social Connections: Moderately Isolated (07/18/2024)  Stress: No Stress Concern Present (04/25/2024)   Received from [facility]  Tobacco Use: Medium Risk (08/31/2024)     Readmission Risk Interventions    09/02/2024   10:27 AM 07/27/2024   12:00 PM 05/31/2024   11:30 AM   Readmission Risk Prevention Plan  Transportation Screening Complete Complete Complete  Medication Review (RN Care Manager) Referral to Pharmacy Referral to Pharmacy Complete  PCP or Specialist appointment within 3-5 days of discharge Complete Complete Complete  HRI or Home Care Consult Complete Complete Complete  SW Recovery Care/Counseling Consult Complete Complete Complete  Palliative Care Screening Not Applicable Not Applicable Not Applicable  Skilled Nursing Facility Not Applicable Not Applicable Not Applicable

## 2024-09-04 NOTE — Plan of Care (Signed)
  Problem: Coping: Goal: Ability to adjust to condition or change in health will improve 09/04/2024 1122 by Delores Kirsch, RN Outcome: Adequate for Discharge 09/04/2024 1121 by Delores Kirsch, RN Outcome: Progressing   Problem: Fluid Volume: Goal: Ability to maintain a balanced intake and output will improve 09/04/2024 1122 by Delores Kirsch, RN Outcome: Adequate for Discharge 09/04/2024 1121 by Delores Kirsch, RN Outcome: Progressing   Problem: Health Behavior/Discharge Planning: Goal: Ability to identify and utilize available resources and services will improve Outcome: Adequate for Discharge Goal: Ability to manage health-related needs will improve Outcome: Adequate for Discharge   Problem: Metabolic: Goal: Ability to maintain appropriate glucose levels will improve Outcome: Adequate for Discharge   Problem: Nutritional: Goal: Maintenance of adequate nutrition will improve Outcome: Adequate for Discharge Goal: Progress toward achieving an optimal weight will improve Outcome: Adequate for Discharge   Problem: Skin Integrity: Goal: Risk for impaired skin integrity will decrease Outcome: Adequate for Discharge   Problem: Tissue Perfusion: Goal: Adequacy of tissue perfusion will improve Outcome: Adequate for Discharge   Problem: Education: Goal: Knowledge of General Education information will improve Description: Including pain rating scale, medication(s)/side effects and non-pharmacologic comfort measures Outcome: Adequate for Discharge   Problem: Health Behavior/Discharge Planning: Goal: Ability to manage health-related needs will improve Outcome: Adequate for Discharge   Problem: Clinical Measurements: Goal: Ability to maintain clinical measurements within normal limits will improve Outcome: Adequate for Discharge Goal: Will remain free from infection Outcome: Adequate for Discharge Goal: Diagnostic test results will improve Outcome: Adequate for Discharge Goal:  Respiratory complications will improve Outcome: Adequate for Discharge Goal: Cardiovascular complication will be avoided Outcome: Adequate for Discharge   Problem: Activity: Goal: Risk for activity intolerance will decrease Outcome: Adequate for Discharge   Problem: Nutrition: Goal: Adequate nutrition will be maintained Outcome: Adequate for Discharge   Problem: Coping: Goal: Level of anxiety will decrease Outcome: Adequate for Discharge   Problem: Elimination: Goal: Will not experience complications related to bowel motility Outcome: Adequate for Discharge Goal: Will not experience complications related to urinary retention Outcome: Adequate for Discharge   Problem: Pain Managment: Goal: General experience of comfort will improve and/or be controlled Outcome: Adequate for Discharge   Problem: Safety: Goal: Ability to remain free from injury will improve Outcome: Adequate for Discharge   Problem: Skin Integrity: Goal: Risk for impaired skin integrity will decrease Outcome: Adequate for Discharge

## 2024-09-09 ENCOUNTER — Encounter (HOSPITAL_COMMUNITY): Payer: Self-pay

## 2024-09-09 ENCOUNTER — Emergency Department (HOSPITAL_COMMUNITY)
Admission: EM | Admit: 2024-09-09 | Discharge: 2024-09-09 | Disposition: A | Discharge status: Home / Self Care | Attending: Emergency Medicine | Admitting: Emergency Medicine

## 2024-09-09 ENCOUNTER — Emergency Department (HOSPITAL_COMMUNITY)

## 2024-09-09 ENCOUNTER — Other Ambulatory Visit: Payer: Self-pay

## 2024-09-09 DIAGNOSIS — R10819 Abdominal tenderness, unspecified site: Secondary | ICD-10-CM | POA: Insufficient documentation

## 2024-09-09 DIAGNOSIS — R1115 Cyclical vomiting syndrome unrelated to migraine: Secondary | ICD-10-CM | POA: Diagnosis present

## 2024-09-09 DIAGNOSIS — Z794 Long term (current) use of insulin: Secondary | ICD-10-CM | POA: Insufficient documentation

## 2024-09-09 DIAGNOSIS — Z7982 Long term (current) use of aspirin: Secondary | ICD-10-CM | POA: Diagnosis not present

## 2024-09-09 LAB — URINALYSIS, ROUTINE W REFLEX MICROSCOPIC
Bilirubin Urine: NEGATIVE
Glucose, UA: 150 mg/dL — AB
Hgb urine dipstick: NEGATIVE
Ketones, ur: NEGATIVE mg/dL
Nitrite: NEGATIVE
Protein, ur: 30 mg/dL — AB
Specific Gravity, Urine: 1.014 (ref 1.005–1.030)
pH: 7 (ref 5.0–8.0)

## 2024-09-09 LAB — COMPREHENSIVE METABOLIC PANEL WITH GFR
ALT: 9 U/L (ref 0–44)
AST: 16 U/L (ref 15–41)
Albumin: 4.1 g/dL (ref 3.5–5.0)
Alkaline Phosphatase: 74 U/L (ref 38–126)
Anion gap: 14 (ref 5–15)
BUN: 21 mg/dL (ref 8–23)
CO2: 26 mmol/L (ref 22–32)
Calcium: 9.6 mg/dL (ref 8.9–10.3)
Chloride: 96 mmol/L — ABNORMAL LOW (ref 98–111)
Creatinine, Ser: 0.69 mg/dL (ref 0.44–1.00)
GFR, Estimated: >60 mL/min (ref >60)
Glucose, Bld: 203 mg/dL — ABNORMAL HIGH (ref 70–99)
Potassium: 3.8 mmol/L (ref 3.5–5.1)
Sodium: 136 mmol/L (ref 135–145)
Total Bilirubin: 0.7 mg/dL (ref 0.0–1.2)
Total Protein: 7.6 g/dL (ref 6.5–8.1)

## 2024-09-09 LAB — CBC
HCT: 47 % — ABNORMAL HIGH (ref 36.0–46.0)
Hemoglobin: 15.6 g/dL — ABNORMAL HIGH (ref 12.0–15.0)
MCH: 28.3 pg (ref 26.0–34.0)
MCHC: 33.2 g/dL (ref 30.0–36.0)
MCV: 85.3 fL (ref 80.0–100.0)
Platelets: 419 K/uL — ABNORMAL HIGH (ref 150–400)
RBC: 5.51 MIL/uL — ABNORMAL HIGH (ref 3.87–5.11)
RDW: 15.2 % (ref 11.5–15.5)
WBC: 9.1 K/uL (ref 4.0–10.5)
nRBC: 0 % (ref 0.0–0.2)

## 2024-09-09 LAB — LIPASE, BLOOD: Lipase: 59 U/L — ABNORMAL HIGH (ref 11–51)

## 2024-09-09 MED ORDER — ONDANSETRON HCL 4 MG/2ML IJ SOLN
4.0000 mg | Freq: Once | INTRAMUSCULAR | Status: AC
  Administered 2024-09-09: 4 mg via INTRAVENOUS
  Filled 2024-09-09: qty 2

## 2024-09-09 MED ORDER — HYDROMORPHONE HCL 1 MG/ML IJ SOLN
1.0000 mg | Freq: Once | INTRAMUSCULAR | Status: AC
  Administered 2024-09-09: 1 mg via INTRAVENOUS
  Filled 2024-09-09: qty 1

## 2024-09-09 MED ORDER — ONDANSETRON 4 MG PO TBDP: ORAL_TABLET | ORAL | 0 refills | Status: DC

## 2024-09-09 MED ORDER — IOHEXOL 350 MG/ML SOLN: 75.0000 mL | Freq: Once | INTRAVENOUS | Status: DC | PRN

## 2024-09-09 MED ORDER — OXYCODONE HCL 5 MG PO TABS
5.0000 mg | ORAL_TABLET | Freq: Once | ORAL | Status: AC
  Administered 2024-09-09: 5 mg via ORAL
  Filled 2024-09-09: qty 1

## 2024-09-09 MED ORDER — PROMETHAZINE HCL 25 MG RE SUPP
25.0000 mg | Freq: Four times a day (QID) | RECTAL | 0 refills | Status: DC | PRN
Start: 2024-09-09 — End: 2024-09-11

## 2024-09-09 MED ORDER — DIPHENHYDRAMINE HCL 50 MG/ML IJ SOLN
25.0000 mg | Freq: Once | INTRAMUSCULAR | Status: AC
  Administered 2024-09-09: 25 mg via INTRAVENOUS
  Filled 2024-09-09: qty 1

## 2024-09-09 MED ORDER — IOHEXOL 350 MG/ML SOLN
50.0000 mL | Freq: Once | INTRAVENOUS | Status: AC | PRN
Start: 2024-09-09 — End: 2024-09-09
  Administered 2024-09-09: 50 mL via INTRAVENOUS

## 2024-09-09 MED ORDER — DROPERIDOL 2.5 MG/ML IJ SOLN
1.2500 mg | Freq: Once | INTRAMUSCULAR | Status: AC
  Administered 2024-09-09: 1.25 mg via INTRAVENOUS
  Filled 2024-09-09: qty 2

## 2024-09-09 NOTE — ED Provider Triage Note (Signed)
 Emergency Medicine Provider Triage Evaluation Note  BRAZIL VOYTKO , a 63 y.o. female  was evaluated in triage.  Pt complains of severe abdominal pain, nausea, vomiting that began acutely at 3 AM.  Reports that it is similar to prior episodes of pancreatitis.  Review of Systems  Positive: Nausea, vomiting, abdominal pain Negative: Chest pain, shortness of breath  Physical Exam  BP 95/68   Pulse 81   Temp 97.9 F (36.6 C)   Resp 18   Ht 4' 11 (1.499 m)   Wt 52.6 kg   LMP 08/11/2012   SpO2 100%   BMI 23.42 kg/m  Gen:   Awake, uncomfortable appearing Resp:  Normal effort  MSK:   Moves extremities without difficulty   Medical Decision Making  Medically screening exam initiated at 12:47 PM.  Appropriate orders placed.  Daesha C Mceachern was informed that the remainder of the evaluation will be completed by another provider, this initial triage assessment does not replace that evaluation, and the importance of remaining in the ED until their evaluation is complete.  Asked nurse for patient to have expedited placement in a bed   Rogelia Jerilynn RAMAN, MD 09/09/24 1248

## 2024-09-09 NOTE — ED Provider Notes (Signed)
 [place] EMERGENCY DEPARTMENT AT [facility] Provider Note   CSN: [id number] Arrival date & time: 09/09/24  1108     Patient presents with: Abdominal Pain   [name] [name] is a 63 y.o. female.   63 yo F with a chief complaints of abdominal pain and intractable nausea and vomiting.  This been going on since this morning.  She thought that her abdomen had been doing relatively well until then.  She has a history of abdominal pain and recurrent vomiting.  States that this feels the same.  No fevers.  Has chills at times        Prior to Admission medications   Medication Sig Start Date End Date Taking? Authorizing Provider  ondansetron  (ZOFRAN -ODT) 4 MG disintegrating tablet 4mg  ODT q4 hours prn nausea/vomit 09/09/24  Yes Brandan Robicheaux, DO  promethazine  (PHENERGAN ) 25 MG suppository Place 1 suppository (25 mg total) rectally every 6 (six) hours as needed for nausea or vomiting. 09/09/24  Yes Emil Share, DO  aspirin  EC 81 MG tablet Take 81 mg by mouth daily. Swallow whole.    [provider]  gabapentin  (NEURONTIN ) 100 MG capsule Take 100 mg by mouth at bedtime.    [provider]  Glucagon, rDNA, (GLUCAGON EMERGENCY) 1 MG KIT Inject 1 mg into the skin See admin instructions. Inject one mg into the skin see administration instructions. Follow package directions for low blood sugar. 06/18/23   [provider]  Insulin  Disposable Pump (OMNIPOD 5 G7 PODS, GEN 5,) MISC Inject 1 Device into the skin every 3 (three) days.    [provider]  levothyroxine  (SYNTHROID ) 150 MCG tablet Take 1 tablet (150 mcg total) by mouth daily before breakfast. 06/01/24   [name], [name], MD  metoCLOPramide  (REGLAN ) 10 MG tablet Take 1 tablet (10 mg total) by mouth every 6 (six) hours. 08/15/24   Bauer, Collin S, PA-C  omega-3 acid ethyl esters (LOVAZA ) 1 g capsule Take 2 capsules (2 g total) by mouth daily as needed (for circulation). 07/27/24   Rosario Leatrice FERNS, MD   ondansetron  (ZOFRAN ) 4 MG tablet Take 2 tablets (8 mg total) by mouth every 8 (eight) hours as needed for nausea or vomiting. 05/31/24   [name], [name], MD  pantoprazole  (PROTONIX ) 40 MG tablet Take 1 tablet (40 mg total) by mouth daily. Patient taking differently: Take 40 mg by mouth daily with supper. 07/28/24   Rosario Leatrice FERNS, MD  TOUJEO  SOLOSTAR 300 UNIT/ML Solostar Pen Inject 25 Units into the skin See admin instructions. Inject 25 units into the skin at bedtime ONLY WHEN NOT USING THE OMNIPOD 07/27/24   Rosario Leatrice FERNS, MD    Allergies: Ciprofloxacin , Codeine, and Flagyl  [metronidazole ]    Review of Systems  Constitutional:  Negative for chills and fever.  HENT:  Negative for congestion and rhinorrhea.   Eyes:  Negative for redness and visual disturbance.  Respiratory:  Negative for shortness of breath and wheezing.   Cardiovascular:  Negative for chest pain and palpitations.  Gastrointestinal:  Negative for nausea and vomiting.  Genitourinary:  Negative for dysuria and urgency.  Musculoskeletal:  Negative for arthralgias and myalgias.  Skin:  Negative for pallor and wound.  Neurological:  Negative for dizziness and headaches.    Updated Vital Signs BP 107/77   Pulse 82   Temp 97.7 F (36.5 C) (Oral)   Resp 19   Ht 4' 11 (1.499 m)   Wt 52.6 kg   LMP 08/11/2012  SpO2 100%   BMI 23.42 kg/m   Physical Exam Vitals and nursing note reviewed.  Constitutional:      General: She is not in acute distress.    Appearance: She is well-developed. She is not diaphoretic.  HENT:     Head: Normocephalic and atraumatic.  Eyes:     Pupils: Pupils are equal, round, and reactive to light.  Cardiovascular:     Rate and Rhythm: Normal rate and regular rhythm.     Heart sounds: No murmur heard.    No friction rub. No gallop.  Pulmonary:     Effort: Pulmonary effort is normal.     Breath sounds: No wheezing or rales.  Abdominal:     General: There is no distension.      Palpations: Abdomen is soft.     Tenderness: There is abdominal tenderness.     Comments: Soft abdomen, diffuse tenderness without obvious focality.  Musculoskeletal:        General: No tenderness.     Cervical back: Normal range of motion and neck supple.  Skin:    General: Skin is warm and dry.  Neurological:     Mental Status: She is alert and oriented to person, place, and time.  Psychiatric:        Behavior: Behavior normal.     (all labs ordered are listed, but only abnormal results are displayed) Labs Reviewed  LIPASE, BLOOD - Abnormal; Notable for the following components:      Result Value   Lipase 59 (*)    All other components within normal limits  COMPREHENSIVE METABOLIC PANEL WITH GFR - Abnormal; Notable for the following components:   Chloride 96 (*)    Glucose, Bld 203 (*)    All other components within normal limits  CBC - Abnormal; Notable for the following components:   RBC 5.51 (*)    Hemoglobin 15.6 (*)    HCT 47.0 (*)    Platelets 419 (*)    All other components within normal limits  URINALYSIS, ROUTINE W REFLEX MICROSCOPIC - Abnormal; Notable for the following components:   APPearance HAZY (*)    Glucose, UA 150 (*)    Protein, ur 30 (*)    Leukocytes,Ua SMALL (*)    Bacteria, UA MANY (*)    All other components within normal limits    EKG: None  Radiology: CT ABDOMEN PELVIS W CONTRAST Result Date: 09/09/2024 CLINICAL DATA:  Vomiting abdominal pain EXAM: CT ABDOMEN AND PELVIS WITH CONTRAST TECHNIQUE: Multidetector CT imaging of the abdomen and pelvis was performed using the standard protocol following bolus administration of intravenous contrast. RADIATION DOSE REDUCTION: This exam was performed according to the departmental dose-optimization program which includes automated exposure control, adjustment of the mA and/or kV according to patient size and/or use of iterative reconstruction technique. CONTRAST:  50mL OMNIPAQUE  IOHEXOL  350 MG/ML SOLN  COMPARISON:  CT 08/31/2024, 08/08/2024, 07/18/2024, 11/11/2023 FINDINGS: Lower chest: Lung bases demonstrate no acute airspace disease. Hepatobiliary: Cholecystectomy. Probable focal fat infiltration near falciform ligament. Question and left hepatic lobe flash filling hemangioma is better seen on the prior exam. Prominence of the common bile duct is stable and likely due to postoperative status Pancreas: No acute inflammation Spleen: Normal in size without focal abnormality. Adrenals/Urinary Tract: Adrenal glands are normal. Kidneys show no hydronephrosis. Mild diffuse thick-walled urinary bladder Stomach/Bowel: Moderate fluid distension of the stomach. Mild wall thickening of pylorus and duodenal bulb. No evidence for small bowel obstruction. Diverticular disease of the  colon without acute wall thickening. Vascular/Lymphatic: Aortic atherosclerosis. No enlarged abdominal or pelvic lymph nodes. Reproductive: Hysterectomy.  No suspicious adnexal mass Other: Negative for pelvic effusion or free air Musculoskeletal: No acute or suspicious osseous abnormality IMPRESSION: 1. Moderate fluid distension of the stomach with mild wall thickening of the pylorus and duodenal bulb, question gastritis/duodenitis. No evidence for small bowel obstruction. 2. Diverticular disease of the colon without acute wall thickening. 3. Mild diffuse thick-walled urinary bladder, question cystitis. 4. Aortic atherosclerosis. Aortic Atherosclerosis (ICD10-I70.0). Electronically Signed   By: Luke Bun M.D.   On: 09/09/2024 15:33     Procedures   Medications Ordered in the ED  iohexol  (OMNIPAQUE ) 350 MG/ML injection 75 mL (has no administration in time range)  ondansetron  (ZOFRAN ) injection 4 mg (4 mg Intravenous Given 09/09/24 1302)  oxyCODONE  (Oxy IR/ROXICODONE ) immediate release tablet 5 mg (5 mg Oral Given 09/09/24 1321)  droperidol  (INAPSINE ) 2.5 MG/ML injection 1.25 mg (1.25 mg Intravenous Given 09/09/24 1322)  diphenhydrAMINE   (BENADRYL ) injection 25 mg (25 mg Intravenous Given 09/09/24 1300)  HYDROmorphone  (DILAUDID ) injection 1 mg (1 mg Intravenous Given 09/09/24 1302)  iohexol  (OMNIPAQUE ) 350 MG/ML injection 50 mL (50 mLs Intravenous Contrast Given 09/09/24 1502)                                    Medical Decision Making Amount and/or Complexity of Data Reviewed Labs: ordered.  Risk Prescription drug management.   63 yo F with a chief complaint of diffuse abdominal pain.  Patient unfortunately has a history of cyclic vomiting and has episodes of recurrent abdominal discomfort.  She thinks it feels similar to that.  Started this morning.  No fevers.  Labs without significant electrolyte abnormalities.  No anemia.  No leukocytosis.  Attempted control pain and nausea.  Reassess.  CT with perhaps gastritis duodenitis.  Patient is feeling better.  No significant electrolyte abnormalities.  UA I think is likely contaminated.  Will try to discharge home.  PCP follow-up.  3:40 PM:  I have discussed the diagnosis/risks/treatment options with the patient.  Evaluation and diagnostic testing in the emergency department does not suggest an emergent condition requiring admission or immediate intervention beyond what has been performed at this time.  They will follow up with PCP, GI. We also discussed returning to the ED immediately if new or worsening sx occur. We discussed the sx which are most concerning (e.g., sudden worsening pain, fever, inability to tolerate by mouth) that necessitate immediate return. Medications administered to the patient during their visit and any new prescriptions provided to the patient are listed below.  Medications given during this visit Medications  iohexol  (OMNIPAQUE ) 350 MG/ML injection 75 mL (has no administration in time range)  ondansetron  (ZOFRAN ) injection 4 mg (4 mg Intravenous Given 09/09/24 1302)  oxyCODONE  (Oxy IR/ROXICODONE ) immediate release tablet 5 mg (5 mg Oral Given 09/09/24  1321)  droperidol  (INAPSINE ) 2.5 MG/ML injection 1.25 mg (1.25 mg Intravenous Given 09/09/24 1322)  diphenhydrAMINE  (BENADRYL ) injection 25 mg (25 mg Intravenous Given 09/09/24 1300)  HYDROmorphone  (DILAUDID ) injection 1 mg (1 mg Intravenous Given 09/09/24 1302)  iohexol  (OMNIPAQUE ) 350 MG/ML injection 50 mL (50 mLs Intravenous Contrast Given 09/09/24 1502)     The patient appears reasonably screen and/or stabilized for discharge and I doubt any other medical condition or other [facility] requiring further screening, evaluation, or treatment in the ED at this time prior to discharge.  Final diagnoses:  Cyclic vomiting syndrome    ED Discharge Orders          Ordered    promethazine  (PHENERGAN ) 25 MG suppository  Every 6 hours PRN        09/09/24 1516    ondansetron  (ZOFRAN -ODT) 4 MG disintegrating tablet        09/09/24 1516               Emil Share, DO 09/09/24 1541

## 2024-09-09 NOTE — ED Notes (Signed)
 Patient transported to CT

## 2024-09-09 NOTE — ED Triage Notes (Signed)
 Pt BIB EMS for c/o RUQ abd pain. Pt seen recently for inflamation in abdomen. +NV. Received 4mg  zofran  from ems. 20g left thumb. BP 135/79 HR 94 SPO2 99 RA CBG 243. Pt states she wants pain control

## 2024-09-09 NOTE — Progress Notes (Signed)
  Evaluation after Contrast Extravasation  Patient seen and examined immediately after contrast extravasation while in [place] CT. Right wrist IV with approximately 20 ml of contrast extravasated.   Exam: There is swelling/tenderness at the right forearm area.  There is no erythema. There is no discoloration. There are no blisters. There are no signs of decreased perfusion of the skin.  It is appropriately warm to touch.  The patient has full ROM in fingers.  Radial pulse normal.  Per contrast extravasation protocol, I have instructed the patient to keep an ice pack on the area for 20-60 minutes at a time for about 48 hours.   Keep arm elevated as much as possible.   The patient understands to call the radiology department if there is: - increase in pain or swelling - changed or altered sensation - ulceration or blistering - increasing redness - warmth or increasing firmness - decreased tissue perfusion as noted by decreased capillary refill or discoloration of skin - decreased pulses peripheral to site   Warren JONELLE Dais, NP 09/09/2024 3:13 PM

## 2024-09-09 NOTE — Discharge Instructions (Signed)
 Please return for sudden worsening abdominal pain fever or inability eat or drink.  Try pepcid  or tagamet up to twice a day.  Try to avoid things that may make this worse, most commonly these are spicy foods tomato based products fatty foods chocolate and peppermint.  Alcohol and tobacco can also make this worse.  Return to the emergency department for sudden worsening pain fever or inability to eat or drink.

## 2024-09-11 ENCOUNTER — Encounter (HOSPITAL_COMMUNITY): Payer: Self-pay

## 2024-09-11 ENCOUNTER — Other Ambulatory Visit: Payer: Self-pay

## 2024-09-11 ENCOUNTER — Emergency Department (HOSPITAL_COMMUNITY)
Admission: EM | Admit: 2024-09-11 | Discharge: 2024-09-11 | Disposition: A | Discharge status: Home / Self Care | Attending: Emergency Medicine | Admitting: Emergency Medicine

## 2024-09-11 DIAGNOSIS — R1115 Cyclical vomiting syndrome unrelated to migraine: Secondary | ICD-10-CM | POA: Insufficient documentation

## 2024-09-11 DIAGNOSIS — R109 Unspecified abdominal pain: Secondary | ICD-10-CM | POA: Diagnosis not present

## 2024-09-11 DIAGNOSIS — R112 Nausea with vomiting, unspecified: Secondary | ICD-10-CM | POA: Diagnosis present

## 2024-09-11 LAB — CBC WITH DIFFERENTIAL/PLATELET
Abs Immature Granulocytes: 0.04 K/uL (ref 0.00–0.07)
Basophils Absolute: 0 K/uL (ref 0.0–0.1)
Basophils Relative: 0 %
Eosinophils Absolute: 0 K/uL (ref 0.0–0.5)
Eosinophils Relative: 0 %
HCT: 44.5 % (ref 36.0–46.0)
Hemoglobin: 14.3 g/dL (ref 12.0–15.0)
Immature Granulocytes: 0 %
Lymphocytes Relative: 17 %
Lymphs Abs: 1.7 K/uL (ref 0.7–4.0)
MCH: 27.2 pg (ref 26.0–34.0)
MCHC: 32.1 g/dL (ref 30.0–36.0)
MCV: 84.6 fL (ref 80.0–100.0)
Monocytes Absolute: 0.8 K/uL (ref 0.1–1.0)
Monocytes Relative: 8 %
Neutro Abs: 7.1 K/uL (ref 1.7–7.7)
Neutrophils Relative %: 75 %
Platelets: 425 K/uL — ABNORMAL HIGH (ref 150–400)
RBC: 5.26 MIL/uL — ABNORMAL HIGH (ref 3.87–5.11)
RDW: 15.3 % (ref 11.5–15.5)
WBC: 9.6 K/uL (ref 4.0–10.5)
nRBC: 0 % (ref 0.0–0.2)

## 2024-09-11 LAB — COMPREHENSIVE METABOLIC PANEL WITH GFR
ALT: 11 U/L (ref 0–44)
AST: 17 U/L (ref 15–41)
Albumin: 4.3 g/dL (ref 3.5–5.0)
Alkaline Phosphatase: 84 U/L (ref 38–126)
Anion gap: 14 (ref 5–15)
BUN: 21 mg/dL (ref 8–23)
CO2: 33 mmol/L — ABNORMAL HIGH (ref 22–32)
Calcium: 11.5 mg/dL — ABNORMAL HIGH (ref 8.9–10.3)
Chloride: 90 mmol/L — ABNORMAL LOW (ref 98–111)
Creatinine, Ser: 0.89 mg/dL (ref 0.44–1.00)
GFR, Estimated: >60 mL/min (ref >60)
Glucose, Bld: 203 mg/dL — ABNORMAL HIGH (ref 70–99)
Potassium: 3.3 mmol/L — ABNORMAL LOW (ref 3.5–5.1)
Sodium: 136 mmol/L (ref 135–145)
Total Bilirubin: 0.5 mg/dL (ref 0.0–1.2)
Total Protein: 7.1 g/dL (ref 6.5–8.1)

## 2024-09-11 LAB — LIPASE, BLOOD: Lipase: 13 U/L (ref 11–51)

## 2024-09-11 MED ORDER — DROPERIDOL 2.5 MG/ML IJ SOLN
2.5000 mg | Freq: Once | INTRAMUSCULAR | Status: AC
  Administered 2024-09-11: 2.5 mg via INTRAVENOUS
  Filled 2024-09-11: qty 2

## 2024-09-11 MED ORDER — PROMETHAZINE HCL 25 MG RE SUPP: 25.0000 mg | Freq: Four times a day (QID) | RECTAL | 0 refills | Status: AC | PRN

## 2024-09-11 MED ORDER — LACTATED RINGERS IV BOLUS: 1000.0000 mL | Freq: Once | INTRAVENOUS | Status: DC

## 2024-09-11 NOTE — ED Triage Notes (Addendum)
 Pt BIBA for n/v/d and generalized abd pain. Pt seen x2days ago and dx w/ cyclic vomiting syndrome. Received 4mg  zofran  and 500cc bolus w/ EMS. Pt has insulin  pump.  22G R-Thumb  BP: 130/70 HR: 90 SPO2: 98% RA  CBG: 304

## 2024-09-11 NOTE — Discharge Instructions (Addendum)
 Do not use marijuana anymore.  You have an allergy to it that is causing significant vomiting.  Follow-up with your doctor.  Follow-up

## 2024-09-11 NOTE — ED Provider Notes (Signed)
 Patient no longer having vomiting and no abdominal pain.  She feels improved with the fluids and nausea medicine and will be discharged home   Suzette Pac, MD 09/11/24 [phone]

## 2024-09-11 NOTE — ED Provider Notes (Signed)
 [place] EMERGENCY DEPARTMENT AT [facility] Provider Note   CSN: [id number] Arrival date & time: 09/11/24  9475     History Chief Complaint  Patient presents with   Abdominal Pain    HPI [name] [name] is a 63 y.o. female presenting for chief complaint of abdominal pain, nausea vomiting.  Seen 48 hours ago for similar, interval worsening.  Endorses ongoing abdominal pain, nausea, vomiting (7-10 times times) Not tolerating p.o. intake.  Dates that she was still smoking marijuana earlier this week. While I was collecting the history, patient interjected to state I need IV pain medication right now.  Informed the patient that narcotic pain medication is unlikely to be successful in long-term management of her well-documented chronic abdominal pain and that the appropriate treatment is to treat the underlying nausea and vomiting and that would help with the abdominal pain resolve over time.  Patient stated just take the damn IV out I would rather leave if you ain't going to give me pain medication. Patient was able to be redirected to stay for pathology directed treatment.   Patient's recorded medical, surgical, social, medication list and allergies were reviewed in the Snapshot window as part of the initial history.   Review of Systems   Review of Systems  Constitutional:  Negative for chills and fever.  HENT:  Negative for ear pain and sore throat.   Eyes:  Negative for pain and visual disturbance.  Respiratory:  Negative for cough and shortness of breath.   Cardiovascular:  Negative for chest pain and palpitations.  Gastrointestinal:  Positive for abdominal pain, nausea and vomiting. Negative for diarrhea.  Genitourinary:  Negative for dysuria and hematuria.  Musculoskeletal:  Negative for arthralgias and back pain.  Skin:  Negative for color change and rash.  Neurological:  Negative for seizures and syncope.  All other systems reviewed and are  negative.   Physical Exam Updated Vital Signs BP (!) 160/110   Pulse 93   Temp 98.2 F (36.8 C) (Oral)   Resp 18   Ht 4' 11 (1.499 m)   Wt 53.5 kg   LMP 08/11/2012   SpO2 91%   BMI 23.83 kg/m  Physical Exam Vitals and nursing note reviewed.  Constitutional:      General: She is not in acute distress.    Appearance: She is well-developed.  HENT:     Head: Normocephalic and atraumatic.  Eyes:     Conjunctiva/sclera: Conjunctivae normal.  Cardiovascular:     Rate and Rhythm: Normal rate and regular rhythm.     Heart sounds: No murmur heard. Pulmonary:     Effort: Pulmonary effort is normal. No respiratory distress.     Breath sounds: Normal breath sounds.  Abdominal:     General: There is no distension.     Palpations: Abdomen is soft.     Tenderness: There is no abdominal tenderness. There is no right CVA tenderness or left CVA tenderness.  Musculoskeletal:        General: No swelling or tenderness. Normal range of motion.     Cervical back: Neck supple.  Skin:    General: Skin is warm and dry.  Neurological:     General: No focal deficit present.     Mental Status: She is alert and oriented to person, place, and time. Mental status is at baseline.     Cranial Nerves: No cranial nerve deficit.      ED Course/ Medical Decision Making/ A&P  Procedures Procedures   Medications Ordered in ED Medications  lactated ringers  bolus 1,000 mL (1,000 mLs Intravenous Bolus 09/11/24 0558)  droperidol  (INAPSINE ) 2.5 MG/ML injection 2.5 mg (2.5 mg Intravenous Given 09/11/24 0558)   Medical Decision Making:   [name] [name] is a 63 y.o. female who presented to the ED today with abdominal pain and nausea/vomitting detailed above.    Handoff received from EMS.  Complete initial physical exam performed, notably the patient  was with severe, violent vomiting. Otherwise hemodynamically stable. Notably, nonbilious/nonbloody emesis. Reviewed and confirmed nursing documentation  for past medical history, family history, social history.    Initial Assessment:   With the patient's presentation of severe vomiting, most likely diagnosis is cyclic vomiting syndrome. Other diagnoses were considered including (but not limited to) appendicitis, cholecystitis, small bowel obstruction, Boerhaave syndrome/Mallory-Weiss tears. These are considered less likely due to history of present illness and physical exam findings.   This is most consistent with an acute life/limb threatening illness complicated by underlying chronic conditions. This may or may not be complicated by metabolic derangements given severity of the emesis. Most likely causes of patient's cyclic vomiting syndrome include: Diabetic gastroparesis Viral gastroenteritis Cannabinoid hyperemesis given patient's exposure  Initial Plan:  Aggressive treatment of nausea vomiting with droperidol  IV (EKG reviewed and without substantial prolonged QTc ) Screening labs including CBC and Metabolic panel to evaluate for infectious or metabolic etiology of disease.  Urinalysis with reflex culture ordered to evaluate for UTI or relevant urologic/nephrologic pathology.  Lipase to evaluate for pancreatitis I have considered further evaluation of patient's abdominal pain with cross-sectional imaging. Patient has had recent cross-sectional imaging for very similar symptoms.  I do not believe that the risks of repeat contrast/radiation exposure outweigh the benefits of ruling out the above pathology given the very low pretest probability.  Ultimately, patient's symptoms will be evaluated after medication therapy as above and if intractable symptoms will again reconsider imaging.  If symptoms have resolved, pretest probability would remain very low and below the risks of radiation exposure. EKG to evaluate for cardiac pathology Objective evaluation as below reviewed   Initial Study Results:   Laboratory  All laboratory results reviewed  without evidence of clinically relevant pathology.    EKG EKG was reviewed independently. Rate, rhythm, axis, intervals all examined and without medically relevant abnormality. ST segments without concerns for elevations.    Radiology:  All images reviewed independently. Agree with radiology report at this time.   CT ABDOMEN PELVIS W CONTRAST Result Date: 09/09/2024 CLINICAL DATA:  Vomiting abdominal pain EXAM: CT ABDOMEN AND PELVIS WITH CONTRAST TECHNIQUE: Multidetector CT imaging of the abdomen and pelvis was performed using the standard protocol following bolus administration of intravenous contrast. RADIATION DOSE REDUCTION: This exam was performed according to the departmental dose-optimization program which includes automated exposure control, adjustment of the mA and/or kV according to patient size and/or use of iterative reconstruction technique. CONTRAST:  50mL OMNIPAQUE  IOHEXOL  350 MG/ML SOLN COMPARISON:  CT 08/31/2024, 08/08/2024, 07/18/2024, 11/11/2023 FINDINGS: Lower chest: Lung bases demonstrate no acute airspace disease. Hepatobiliary: Cholecystectomy. Probable focal fat infiltration near falciform ligament. Question and left hepatic lobe flash filling hemangioma is better seen on the prior exam. Prominence of the common bile duct is stable and likely due to postoperative status Pancreas: No acute inflammation Spleen: Normal in size without focal abnormality. Adrenals/Urinary Tract: Adrenal glands are normal. Kidneys show no hydronephrosis. Mild diffuse thick-walled urinary bladder Stomach/Bowel: Moderate fluid distension of the stomach. Mild wall thickening  of pylorus and duodenal bulb. No evidence for small bowel obstruction. Diverticular disease of the colon without acute wall thickening. Vascular/Lymphatic: Aortic atherosclerosis. No enlarged abdominal or pelvic lymph nodes. Reproductive: Hysterectomy.  No suspicious adnexal mass Other: Negative for pelvic effusion or free air  Musculoskeletal: No acute or suspicious osseous abnormality IMPRESSION: 1. Moderate fluid distension of the stomach with mild wall thickening of the pylorus and duodenal bulb, question gastritis/duodenitis. No evidence for small bowel obstruction. 2. Diverticular disease of the colon without acute wall thickening. 3. Mild diffuse thick-walled urinary bladder, question cystitis. 4. Aortic atherosclerosis. Aortic Atherosclerosis (ICD10-I70.0). Electronically Signed   By: Luke Bun M.D.   On: 09/09/2024 15:33   CT ABDOMEN PELVIS W CONTRAST Result Date: 08/31/2024 CLINICAL DATA:  Abdominal pain, evaluate for obstruction or perforation EXAM: CT ABDOMEN AND PELVIS WITH CONTRAST TECHNIQUE: Multidetector CT imaging of the abdomen and pelvis was performed using the standard protocol following bolus administration of intravenous contrast. RADIATION DOSE REDUCTION: This exam was performed according to the departmental dose-optimization program which includes automated exposure control, adjustment of the mA and/or kV according to patient size and/or use of iterative reconstruction technique. CONTRAST:  75mL OMNIPAQUE  IOHEXOL  350 MG/ML SOLN COMPARISON:  CT abdomen pelvis August 08, 2024 FINDINGS: Lower chest: No acute abnormality. Hepatobiliary: Elongated right hepatic lobe measuring 19.4 cm in craniocaudal dimension. An enhancing nodule in left hepatic lobe measuring 9 mm likely a flash filling hemangioma. Focal fat in segment 4 typical location measuring 1.4 cm. Cholecystectomy. Common bile duct is dilated up to 9.4 mm which can be seen the setting of post cholecystectomy. Pancreas: Atrophic changes of the pancreas. Accessory duct of Santorini, normal variation (query ansa pancreatica?). Evaluation is suboptimal on current nondedicated CT. No ductal dilatation or mass lesion. Spleen: Normal in size without focal abnormality. Adrenals/Urinary Tract: Adrenal glands are unremarkable. Kidneys are normal, without renal  calculi, focal lesion, or hydronephrosis. Bladder is thick-walled and trabeculated with few left posterolateral wall wide mouth diverticula. Stomach/Bowel: Prominent gastric and duodenal folds suggestive of gastroduodenitis. Colonic diverticulosis without diverticulitis. Normal appendix. No bowel obstruction or evidence of perforation. Small bowel loops are normal. Vascular/Lymphatic: Atherosclerotic calcifications of aorta and branches. Reproductive: Hysterectomy.  No adnexal mass. Other: Small fat containing umbilical/paraumbilical hernia. Musculoskeletal: Mild degenerative changes of the spine. IMPRESSION: Hepatic steatosis. Left hepatic lobe enhancing lesion stable to prior most consistent with flash filling hemangioma. Prominent gastroduodenal folds suggestive of gastroduodenitis. No bowel obstruction or perforation. Cholecystectomy and extrahepatic biliary dilatation (reservoir phenomenon) Remainder of the findings are stable to prior. Electronically Signed   By: Megan  Zare M.D.   On: 08/31/2024 15:24   CT Head Wo Contrast Result Date: 08/31/2024 CLINICAL DATA:  Fall with head trauma. EXAM: CT HEAD WITHOUT CONTRAST TECHNIQUE: Contiguous axial images were obtained from the base of the skull through the vertex without intravenous contrast. RADIATION DOSE REDUCTION: This exam was performed according to the departmental dose-optimization program which includes automated exposure control, adjustment of the mA and/or kV according to patient size and/or use of iterative reconstruction technique. COMPARISON:  07/24/2024 FINDINGS: Brain: Ventricles, cisterns and other CSF spaces are normal. There is no mass, mass effect, shift of midline structures or acute hemorrhage. No acute infarction. Vascular: No hyperdense vessel or unexpected calcification. Skull: Normal. Negative for fracture or focal lesion. Sinuses/Orbits: No acute finding. Other: None. IMPRESSION: No acute findings. Electronically Signed   By: Toribio Agreste M.D.   On: 08/31/2024 15:07   LONG TERM MONITOR XT (3-14  DAYS) Result Date: 08/21/2024 Zio patch monitor 13 days 07/31/2024 - 08/14/2024: Dominant rhythm: Sinus. HR 66-138 bpm. Avg HR 87 bpm, in sinus rhythm. 2 episodes of atrial tachycardia, fastest at 194 bpm for 5 beats, longest for 12 beats at 149 bpm. <1% isolated SVE,  couplet/triplets. 0 episodes of VT. <1% isolated VE, couplet/triplets. No atrial fibrillation/atrial flutter/VT/high grade AV block, sinus pause >3sec noted. 17 patient triggered events, correlated with sinus rhythm, with or without SVE/VE.   DG Chest 2 View Result Date: 08/14/2024 EXAM: 2 VIEW(S) XRAY OF THE CHEST 08/14/2024 06:51:48 PM COMPARISON: 01/10/2024. Normal. CLINICAL HISTORY: Chest pain. FINDINGS: LUNGS AND PLEURA: No focal pulmonary opacity. No pulmonary edema. No pleural effusion. No pneumothorax. HEART AND MEDIASTINUM: No acute abnormality of the cardiac and mediastinal silhouettes. BONES AND SOFT TISSUES: No acute osseous abnormality. IMPRESSION: 1. No acute process. Electronically signed by: Norman Gatlin MD 08/14/2024 06:58 PM EDT RP Workstation: [id number]    Reassessment and Plan:   Patient had complete resolution of their nausea vomiting and have been able to tolerate p.o. liquids at this time.  No metabolic crisis warranting more aggressive intervention at this time.  Will continue treatment with outpatient antiemetics and recommend follow-up with their primary care provider.  Disposition:  I believe patient will be stable for discharge after lab work has resulted and been reviewed by oncoming team..  Care patient handed off to oncoming team pending these results. Emergency Department Medication Summary:   Medications  lactated ringers  bolus 1,000 mL (1,000 mLs Intravenous Bolus 09/11/24 0558)  droperidol  (INAPSINE ) 2.5 MG/ML injection 2.5 mg (2.5 mg Intravenous Given 09/11/24 0558)        Clinical Impression:  1. Cyclical vomiting syndrome not  associated with migraine      Data Unavailable   Final Clinical Impression(s) / ED Diagnoses Final diagnoses:  Cyclical vomiting syndrome not associated with migraine    Rx / DC Orders ED Discharge Orders          Ordered    promethazine  (PHENERGAN ) 25 MG suppository  Every 6 hours PRN        09/11/24 0555              Jerral Meth, MD 09/11/24 [phone]

## 2024-09-19 ENCOUNTER — Other Ambulatory Visit: Payer: Self-pay

## 2024-09-19 ENCOUNTER — Emergency Department (HOSPITAL_COMMUNITY)
Admission: EM | Admit: 2024-09-19 | Discharge: 2024-09-19 | Disposition: A | Discharge status: Home / Self Care | Attending: Emergency Medicine | Admitting: Emergency Medicine

## 2024-09-19 ENCOUNTER — Encounter (HOSPITAL_COMMUNITY): Payer: Self-pay

## 2024-09-19 ENCOUNTER — Emergency Department (HOSPITAL_COMMUNITY)

## 2024-09-19 DIAGNOSIS — N182 Chronic kidney disease, stage 2 (mild): Secondary | ICD-10-CM | POA: Insufficient documentation

## 2024-09-19 DIAGNOSIS — R55 Syncope and collapse: Secondary | ICD-10-CM | POA: Insufficient documentation

## 2024-09-19 DIAGNOSIS — S82831A Other fracture of upper and lower end of right fibula, initial encounter for closed fracture: Secondary | ICD-10-CM | POA: Insufficient documentation

## 2024-09-19 DIAGNOSIS — E1022 Type 1 diabetes mellitus with diabetic chronic kidney disease: Secondary | ICD-10-CM | POA: Diagnosis not present

## 2024-09-19 DIAGNOSIS — W1839XA Other fall on same level, initial encounter: Secondary | ICD-10-CM | POA: Insufficient documentation

## 2024-09-19 DIAGNOSIS — W19XXXA Unspecified fall, initial encounter: Secondary | ICD-10-CM

## 2024-09-19 DIAGNOSIS — I5032 Chronic diastolic (congestive) heart failure: Secondary | ICD-10-CM | POA: Insufficient documentation

## 2024-09-19 DIAGNOSIS — Y92009 Unspecified place in unspecified non-institutional (private) residence as the place of occurrence of the external cause: Secondary | ICD-10-CM | POA: Diagnosis not present

## 2024-09-19 DIAGNOSIS — E039 Hypothyroidism, unspecified: Secondary | ICD-10-CM | POA: Diagnosis not present

## 2024-09-19 DIAGNOSIS — S8991XA Unspecified injury of right lower leg, initial encounter: Secondary | ICD-10-CM | POA: Diagnosis present

## 2024-09-19 DIAGNOSIS — Z7982 Long term (current) use of aspirin: Secondary | ICD-10-CM | POA: Diagnosis not present

## 2024-09-19 DIAGNOSIS — Z794 Long term (current) use of insulin: Secondary | ICD-10-CM | POA: Insufficient documentation

## 2024-09-19 LAB — COMPREHENSIVE METABOLIC PANEL WITH GFR
ALT: 10 U/L (ref 0–44)
AST: 13 U/L — ABNORMAL LOW (ref 15–41)
Albumin: 3.8 g/dL (ref 3.5–5.0)
Alkaline Phosphatase: 67 U/L (ref 38–126)
Anion gap: 12 (ref 5–15)
BUN: 19 mg/dL (ref 8–23)
CO2: 21 mmol/L — ABNORMAL LOW (ref 22–32)
Calcium: 8.8 mg/dL — ABNORMAL LOW (ref 8.9–10.3)
Chloride: 101 mmol/L (ref 98–111)
Creatinine, Ser: 0.74 mg/dL (ref 0.44–1.00)
GFR, Estimated: >60 mL/min
Glucose, Bld: 185 mg/dL — ABNORMAL HIGH (ref 70–99)
Potassium: 4.4 mmol/L (ref 3.5–5.1)
Sodium: 134 mmol/L — ABNORMAL LOW (ref 135–145)
Total Bilirubin: 0.6 mg/dL (ref 0.0–1.2)
Total Protein: 7.1 g/dL (ref 6.5–8.1)

## 2024-09-19 LAB — CBC WITH DIFFERENTIAL/PLATELET
Abs Immature Granulocytes: 0.06 K/uL (ref 0.00–0.07)
Basophils Absolute: 0 K/uL (ref 0.0–0.1)
Basophils Relative: 0 %
Eosinophils Absolute: 0.2 K/uL (ref 0.0–0.5)
Eosinophils Relative: 2 %
HCT: 43 % (ref 36.0–46.0)
Hemoglobin: 14.2 g/dL (ref 12.0–15.0)
Immature Granulocytes: 1 %
Lymphocytes Relative: 32 %
Lymphs Abs: 3.2 K/uL (ref 0.7–4.0)
MCH: 28.4 pg (ref 26.0–34.0)
MCHC: 33 g/dL (ref 30.0–36.0)
MCV: 86 fL (ref 80.0–100.0)
Monocytes Absolute: 0.6 K/uL (ref 0.1–1.0)
Monocytes Relative: 6 %
Neutro Abs: 6.1 K/uL (ref 1.7–7.7)
Neutrophils Relative %: 59 %
Platelets: 295 K/uL (ref 150–400)
RBC: 5 MIL/uL (ref 3.87–5.11)
RDW: 15.2 % (ref 11.5–15.5)
WBC: 10.1 K/uL (ref 4.0–10.5)
nRBC: 0 % (ref 0.0–0.2)

## 2024-09-19 LAB — URINALYSIS, ROUTINE W REFLEX MICROSCOPIC
Bilirubin Urine: NEGATIVE
Glucose, UA: 150 mg/dL — AB
Hgb urine dipstick: NEGATIVE
Ketones, ur: NEGATIVE mg/dL
Nitrite: NEGATIVE
Protein, ur: NEGATIVE mg/dL
Specific Gravity, Urine: 1.009 (ref 1.005–1.030)
pH: 6 (ref 5.0–8.0)

## 2024-09-19 MED ORDER — HYDROCODONE-ACETAMINOPHEN 5-325 MG PO TABS: 1.0000 | ORAL_TABLET | ORAL | 0 refills | Status: DC | PRN

## 2024-09-19 MED ORDER — KETOROLAC TROMETHAMINE 30 MG/ML IJ SOLN
15.0000 mg | Freq: Once | INTRAMUSCULAR | Status: AC
  Administered 2024-09-19: 15 mg via INTRAVENOUS
  Filled 2024-09-19: qty 1

## 2024-09-19 MED ORDER — HYDROCODONE-ACETAMINOPHEN 5-325 MG PO TABS
2.0000 | ORAL_TABLET | Freq: Once | ORAL | Status: AC
  Administered 2024-09-19: 2 via ORAL
  Filled 2024-09-19: qty 2

## 2024-09-19 MED ORDER — HYDROCODONE-ACETAMINOPHEN 5-325 MG PO TABS: 1.0000 | ORAL_TABLET | Freq: Once | ORAL | Status: DC

## 2024-09-19 MED ORDER — SODIUM CHLORIDE 0.9 % IV BOLUS
500.0000 mL | Freq: Once | INTRAVENOUS | Status: AC
  Administered 2024-09-19: 500 mL via INTRAVENOUS

## 2024-09-19 NOTE — ED Provider Notes (Signed)
  Physical Exam  BP (!) 129/97   Pulse 86   Temp 97.7 F (36.5 C) (Oral)   Resp (!) 22   Ht 4' 11 (1.499 m)   Wt 54 kg   LMP 08/11/2012   SpO2 100%   BMI 24.04 kg/m   Physical Exam  Procedures  Procedures  ED Course / MDM    Medical Decision Making Amount and/or Complexity of Data Reviewed Labs: ordered. Radiology: ordered.  Risk Prescription drug management.   LOC, ?orthostasis This morning stood up, lightheaded, fell to right leg Pending right tib/fib Has gotten fluids Will need re-evaluation for BP Consider increasing Midodrine  for hypotension  15:30- introduction to the patient. She reports her pain is uncontrolled with single dose hydrocodone . Is requesting Dilaudid . Denies regular use of opioids at home. Discussed dilaudid  would not be used with her low blood pressures here.  BP while in the bed 129 systolic currently. Will provided 2nd dose hydrocodone .   Tib/fib fracture questionable. Nondisplaced, lucency proximal fibula.   18:00 - Blood pressure has been within normal range. IV fluids 500 cc provided.   Discussed fracture care with orthopedics or advised, if no coinciding ankle injury, she can be weight bearing as tolerated. Ortho follow up is not necessary. On exam, there is no ankle pain with movement, no ankle tenderness or swelling.   Will discharge home. She has a walker at home and is advised to use as needed. She can be discharged home per plan of previous treatment team.          Odell Balls, PA-C 09/19/24 1807    Lenor Hollering, MD 09/20/24 [phone]

## 2024-09-19 NOTE — Discharge Instructions (Addendum)
 As we discussed, there is a 'crack' in the lower leg bone on the right where you hit it when you fell. No further intervention for this is needed. You can weight bear as tolerated and it is recommended that you use your walker until you are able to bear weight without pain or soreness.   Follow up with your doctor as needed.

## 2024-09-19 NOTE — ED Triage Notes (Signed)
 Pt bib ems from a walk in clinic. Pt felt weak and fell at home this morning and hurt her right leg. BP 70/50s at walk in clinic. Reported a syncopal episode last night and again this morning while at the clinic.

## 2024-09-19 NOTE — ED Provider Notes (Signed)
 [place] EMERGENCY DEPARTMENT AT [facility] Provider Note   CSN: [id number] Arrival date & time: 09/19/24  1147     Patient presents with: Loss of Consciousness and Fall   [name] [name] is a 63 y.o. female who was brought into the ED by EMS after having visited walk-in clinic, had profound weakness and had a fall where she had pain in the right lower leg, was noted to be hypotensive at walk-in clinic and has known history of orthostatic hypotension which she takes midodrine .  Stated that this morning she had a syncopal event while at home, leading her to present to the walk-in clinic with the right leg pain.  Fall was on a hard floor, leg impacted a step during the fall.  Previous medical diagnoses significant for hypothyroidism, B12 deficiency, stage II CKD, paroxysmal atrial fibrillation, chronic diastolic heart failure, type 1 diabetes.  Previous ED visit noted on 3 October for abdominal pain, treated in the ED for her abdominal pain and nausea and discharged with outpatient follow-up.  Seen again here at [facility] on 09 September 2024 with similar type symptoms.    Loss of Consciousness Fall       Prior to Admission medications   Medication Sig Start Date End Date Taking? Authorizing Provider  aspirin  EC 81 MG tablet Take 81 mg by mouth daily. Swallow whole.    [provider]  gabapentin  (NEURONTIN ) 100 MG capsule Take 100 mg by mouth at bedtime.    [provider]  Glucagon, rDNA, (GLUCAGON EMERGENCY) 1 MG KIT Inject 1 mg into the skin See admin instructions. Inject one mg into the skin see administration instructions. Follow package directions for low blood sugar. 06/18/23   [provider]  Insulin  Disposable Pump (OMNIPOD 5 G7 PODS, GEN 5,) MISC Inject 1 Device into the skin every 3 (three) days.    [provider]  levothyroxine  (SYNTHROID ) 150 MCG tablet Take 1 tablet (150 mcg total) by mouth daily before breakfast. 06/01/24    [name], [name], MD  metoCLOPramide  (REGLAN ) 10 MG tablet Take 1 tablet (10 mg total) by mouth every 6 (six) hours. 08/15/24   Bauer, Collin S, PA-C  omega-3 acid ethyl esters (LOVAZA ) 1 g capsule Take 2 capsules (2 g total) by mouth daily as needed (for circulation). 07/27/24   Rosario Leatrice FERNS, MD  ondansetron  (ZOFRAN ) 4 MG tablet Take 2 tablets (8 mg total) by mouth every 8 (eight) hours as needed for nausea or vomiting. 05/31/24   [name], [name], MD  ondansetron  (ZOFRAN -ODT) 4 MG disintegrating tablet 4mg  ODT q4 hours prn nausea/vomit 09/09/24   Floyd, Dan, DO  pantoprazole  (PROTONIX ) 40 MG tablet Take 1 tablet (40 mg total) by mouth daily. Patient taking differently: Take 40 mg by mouth daily with supper. 07/28/24   Rosario Leatrice FERNS, MD  promethazine  (PHENERGAN ) 25 MG suppository Place 1 suppository (25 mg total) rectally every 6 (six) hours as needed for nausea or vomiting. 09/11/24   Countryman, Chase, MD  TOUJEO  SOLOSTAR 300 UNIT/ML Solostar Pen Inject 25 Units into the skin See admin instructions. Inject 25 units into the skin at bedtime ONLY WHEN NOT USING THE OMNIPOD 07/27/24   Rosario Leatrice FERNS, MD    Allergies: Ciprofloxacin , Codeine, and Flagyl  [metronidazole ]    Review of Systems  Constitutional:  Positive for fatigue.  Cardiovascular:  Positive for syncope.  Musculoskeletal:  Positive for arthralgias.  All other systems reviewed and are negative.   Updated Vital  Signs BP (!) 129/97   Pulse 86   Temp 98 F (36.7 C) (Oral)   Resp (!) 22   Ht 4' 11 (1.499 m)   Wt 54 kg   LMP 08/11/2012   SpO2 100%   BMI 24.04 kg/m   Physical Exam Vitals and nursing note reviewed.  Constitutional:      General: She is not in acute distress.    Appearance: Normal appearance.  HENT:     Head: Normocephalic and atraumatic.     Mouth/Throat:     Mouth: Mucous membranes are moist.     Pharynx: Oropharynx is clear.  Eyes:     General: Lids are normal. Vision grossly intact. Gaze  aligned appropriately.     Extraocular Movements: Extraocular movements intact.     Conjunctiva/sclera: Conjunctivae normal.     Pupils: Pupils are equal, round, and reactive to light.  Cardiovascular:     Rate and Rhythm: Normal rate and regular rhythm.     Pulses: Normal pulses.     Heart sounds: Normal heart sounds. No murmur heard.    No friction rub. No gallop.  Pulmonary:     Effort: Pulmonary effort is normal.     Breath sounds: Normal breath sounds.  Abdominal:     General: Abdomen is flat. Bowel sounds are normal.     Palpations: Abdomen is soft.  Musculoskeletal:        General: Normal range of motion.     Cervical back: Full passive range of motion without pain, normal range of motion and neck supple.     Right lower leg: Tenderness present. No edema.     Left lower leg: No edema.       Legs:     Comments: Tenderness to the medial aspect of the right distal lower extremity.  Increased pain with dorsiflexion and plantarflexion of the right foot, pain appreciated in the proximal aspect of the lower leg.  Skin:    General: Skin is warm and dry.     Capillary Refill: Capillary refill takes less than 2 seconds.  Neurological:     General: No focal deficit present.     Mental Status: She is alert and oriented to person, place, and time. Mental status is at baseline.     GCS: GCS eye subscore is 4. GCS verbal subscore is 5. GCS motor subscore is 6.     Cranial Nerves: Cranial nerves 2-12 are intact.     Sensory: Sensation is intact.     Motor: Motor function is intact.     Coordination: Coordination is intact.  Psychiatric:        Mood and Affect: Mood normal.     (all labs ordered are listed, but only abnormal results are displayed) Labs Reviewed  COMPREHENSIVE METABOLIC PANEL WITH GFR - Abnormal; Notable for the following components:      Result Value   Sodium 134 (*)    CO2 21 (*)    Glucose, Bld 185 (*)    Calcium  8.8 (*)    AST 13 (*)    All other components  within normal limits  CBC WITH DIFFERENTIAL/PLATELET  URINALYSIS, ROUTINE W REFLEX MICROSCOPIC    EKG: None  Radiology: DG Tibia/Fibula Right Result Date: 09/19/2024 CLINICAL DATA:  Medial right leg pain status post fall EXAM: RIGHT TIBIA AND FIBULA - 2 VIEW COMPARISON:  None Available. FINDINGS: Subtle, irregular lucency through the proximal fibular metaphysis. Soft tissues are unremarkable. IMPRESSION: Subtle, irregular lucency through  the proximal fibular metaphysis, which may represent a nondisplaced fracture. Electronically Signed   By: Limin  Xu M.D.   On: 09/19/2024 14:58     Procedures   Medications Ordered in the ED  HYDROcodone -acetaminophen  (NORCO/VICODIN) 5-325 MG per tablet 1 tablet (has no administration in time range)  HYDROcodone -acetaminophen  (NORCO/VICODIN) 5-325 MG per tablet 2 tablet (2 tablets Oral Given 09/19/24 1431)                                    Medical Decision Making Amount and/or Complexity of Data Reviewed Labs: ordered. Radiology: ordered.  Risk Prescription drug management.   Medical Decision Making:   [name] [name] is a 63 y.o. female who presented to the ED today with right leg pain and syncopal episode detailed above.     Complete initial physical exam performed, notably the patient  was alert and oriented in no apparent distress.  There is tenderness appreciated to the proximal medial aspect of the right lower extremity.  Pain is increased with dorsi and plantarflexion..    Reviewed and confirmed nursing documentation for past medical history, family history, social history.    Initial Assessment:   With the patient's presentation of syncope and right limb pain, most likely diagnosis is regarding syncope, consider orthostatic hypotension, vasovagal syncope, acute dysrhythmia/rapid atrial fibrillation.  Regarding the leg pain consider possible fracture to the right tibia/fibula, also consider contusion and sprain.     Initial Plan:   Plain film imaging of the right tib-fib to evaluate for fracture at this time. Screening labs including CBC and Metabolic panel to evaluate for infectious or metabolic etiology of disease.  Urinalysis with reflex culture ordered to evaluate for UTI or relevant urologic/nephrologic pathology.  EKG to evaluate for cardiac pathology Objective evaluation as below reviewed   Initial Study Results:   Laboratory  All laboratory results reviewed without evidence of clinically relevant pathology.   Exceptions include: None  EKG EKG was reviewed independently. Rate, rhythm, axis, intervals all examined and without medically relevant abnormality. ST segments without concerns for elevations.    Radiology:  All images reviewed independently. Agree with radiology report at this time.   DG Tibia/Fibula Right Result Date: 09/19/2024 CLINICAL DATA:  Medial right leg pain status post fall EXAM: RIGHT TIBIA AND FIBULA - 2 VIEW COMPARISON:  None Available. FINDINGS: Subtle, irregular lucency through the proximal fibular metaphysis. Soft tissues are unremarkable. IMPRESSION: Subtle, irregular lucency through the proximal fibular metaphysis, which may represent a nondisplaced fracture. Electronically Signed   By: Limin  Xu M.D.   On: 09/19/2024 14:58   CT ABDOMEN PELVIS W CONTRAST Result Date: 09/09/2024 CLINICAL DATA:  Vomiting abdominal pain EXAM: CT ABDOMEN AND PELVIS WITH CONTRAST TECHNIQUE: Multidetector CT imaging of the abdomen and pelvis was performed using the standard protocol following bolus administration of intravenous contrast. RADIATION DOSE REDUCTION: This exam was performed according to the departmental dose-optimization program which includes automated exposure control, adjustment of the mA and/or kV according to patient size and/or use of iterative reconstruction technique. CONTRAST:  50mL OMNIPAQUE  IOHEXOL  350 MG/ML SOLN COMPARISON:  CT 08/31/2024, 08/08/2024, 07/18/2024, 11/11/2023 FINDINGS:  Lower chest: Lung bases demonstrate no acute airspace disease. Hepatobiliary: Cholecystectomy. Probable focal fat infiltration near falciform ligament. Question and left hepatic lobe flash filling hemangioma is better seen on the prior exam. Prominence of the common bile duct is stable and likely due to postoperative status Pancreas:  No acute inflammation Spleen: Normal in size without focal abnormality. Adrenals/Urinary Tract: Adrenal glands are normal. Kidneys show no hydronephrosis. Mild diffuse thick-walled urinary bladder Stomach/Bowel: Moderate fluid distension of the stomach. Mild wall thickening of pylorus and duodenal bulb. No evidence for small bowel obstruction. Diverticular disease of the colon without acute wall thickening. Vascular/Lymphatic: Aortic atherosclerosis. No enlarged abdominal or pelvic lymph nodes. Reproductive: Hysterectomy.  No suspicious adnexal mass Other: Negative for pelvic effusion or free air Musculoskeletal: No acute or suspicious osseous abnormality IMPRESSION: 1. Moderate fluid distension of the stomach with mild wall thickening of the pylorus and duodenal bulb, question gastritis/duodenitis. No evidence for small bowel obstruction. 2. Diverticular disease of the colon without acute wall thickening. 3. Mild diffuse thick-walled urinary bladder, question cystitis. 4. Aortic atherosclerosis. Aortic Atherosclerosis (ICD10-I70.0). Electronically Signed   By: Luke Bun M.D.   On: 09/09/2024 15:33   CT ABDOMEN PELVIS W CONTRAST Result Date: 08/31/2024 CLINICAL DATA:  Abdominal pain, evaluate for obstruction or perforation EXAM: CT ABDOMEN AND PELVIS WITH CONTRAST TECHNIQUE: Multidetector CT imaging of the abdomen and pelvis was performed using the standard protocol following bolus administration of intravenous contrast. RADIATION DOSE REDUCTION: This exam was performed according to the departmental dose-optimization program which includes automated exposure control, adjustment  of the mA and/or kV according to patient size and/or use of iterative reconstruction technique. CONTRAST:  75mL OMNIPAQUE  IOHEXOL  350 MG/ML SOLN COMPARISON:  CT abdomen pelvis August 08, 2024 FINDINGS: Lower chest: No acute abnormality. Hepatobiliary: Elongated right hepatic lobe measuring 19.4 cm in craniocaudal dimension. An enhancing nodule in left hepatic lobe measuring 9 mm likely a flash filling hemangioma. Focal fat in segment 4 typical location measuring 1.4 cm. Cholecystectomy. Common bile duct is dilated up to 9.4 mm which can be seen the setting of post cholecystectomy. Pancreas: Atrophic changes of the pancreas. Accessory duct of Santorini, normal variation (query ansa pancreatica?). Evaluation is suboptimal on current nondedicated CT. No ductal dilatation or mass lesion. Spleen: Normal in size without focal abnormality. Adrenals/Urinary Tract: Adrenal glands are unremarkable. Kidneys are normal, without renal calculi, focal lesion, or hydronephrosis. Bladder is thick-walled and trabeculated with few left posterolateral wall wide mouth diverticula. Stomach/Bowel: Prominent gastric and duodenal folds suggestive of gastroduodenitis. Colonic diverticulosis without diverticulitis. Normal appendix. No bowel obstruction or evidence of perforation. Small bowel loops are normal. Vascular/Lymphatic: Atherosclerotic calcifications of aorta and branches. Reproductive: Hysterectomy.  No adnexal mass. Other: Small fat containing umbilical/paraumbilical hernia. Musculoskeletal: Mild degenerative changes of the spine. IMPRESSION: Hepatic steatosis. Left hepatic lobe enhancing lesion stable to prior most consistent with flash filling hemangioma. Prominent gastroduodenal folds suggestive of gastroduodenitis. No bowel obstruction or perforation. Cholecystectomy and extrahepatic biliary dilatation (reservoir phenomenon) Remainder of the findings are stable to prior. Electronically Signed   By: Megan  Zare M.D.   On:  08/31/2024 15:24   CT Head Wo Contrast Result Date: 08/31/2024 CLINICAL DATA:  Fall with head trauma. EXAM: CT HEAD WITHOUT CONTRAST TECHNIQUE: Contiguous axial images were obtained from the base of the skull through the vertex without intravenous contrast. RADIATION DOSE REDUCTION: This exam was performed according to the departmental dose-optimization program which includes automated exposure control, adjustment of the mA and/or kV according to patient size and/or use of iterative reconstruction technique. COMPARISON:  07/24/2024 FINDINGS: Brain: Ventricles, cisterns and other CSF spaces are normal. There is no mass, mass effect, shift of midline structures or acute hemorrhage. No acute infarction. Vascular: No hyperdense vessel or unexpected calcification. Skull: Normal. Negative for fracture  or focal lesion. Sinuses/Orbits: No acute finding. Other: None. IMPRESSION: No acute findings. Electronically Signed   By: Toribio Agreste M.D.   On: 08/31/2024 15:07    Reassessment and Plan:   At time of care handoff imaging and lab evaluation is largely pending.  Labs that have resulted do not show any acute abnormalities.  Given her clinical picture, dissipate that she may be stable for discharge with increased midodrine  dosing to manage her blood pressure, follow-up to cardiology and primary care.  Ultimately disposition pending imaging, may require orthopedic consultation.  Care signed off to S. Odell RIGGERS       Final diagnoses:  None    ED Discharge Orders     None          Myriam Dorn BROCKS, [place] 09/19/24 1553    Lenor Hollering, MD 09/20/24 [phone]

## 2024-10-04 ENCOUNTER — Encounter (HOSPITAL_COMMUNITY): Payer: Self-pay

## 2024-10-04 ENCOUNTER — Other Ambulatory Visit: Payer: Self-pay

## 2024-10-04 ENCOUNTER — Emergency Department (HOSPITAL_COMMUNITY)

## 2024-10-04 ENCOUNTER — Emergency Department (HOSPITAL_COMMUNITY)
Admission: EM | Admit: 2024-10-04 | Discharge: 2024-10-04 | Disposition: A | Discharge status: Home / Self Care | Attending: Emergency Medicine | Admitting: Emergency Medicine

## 2024-10-04 DIAGNOSIS — E1122 Type 2 diabetes mellitus with diabetic chronic kidney disease: Secondary | ICD-10-CM | POA: Insufficient documentation

## 2024-10-04 DIAGNOSIS — I130 Hypertensive heart and chronic kidney disease with heart failure and stage 1 through stage 4 chronic kidney disease, or unspecified chronic kidney disease: Secondary | ICD-10-CM | POA: Insufficient documentation

## 2024-10-04 DIAGNOSIS — W868XXA Exposure to other electric current, initial encounter: Secondary | ICD-10-CM

## 2024-10-04 DIAGNOSIS — D582 Other hemoglobinopathies: Secondary | ICD-10-CM | POA: Insufficient documentation

## 2024-10-04 DIAGNOSIS — I509 Heart failure, unspecified: Secondary | ICD-10-CM | POA: Insufficient documentation

## 2024-10-04 DIAGNOSIS — N189 Chronic kidney disease, unspecified: Secondary | ICD-10-CM | POA: Insufficient documentation

## 2024-10-04 DIAGNOSIS — R7989 Other specified abnormal findings of blood chemistry: Secondary | ICD-10-CM | POA: Diagnosis not present

## 2024-10-04 DIAGNOSIS — R1084 Generalized abdominal pain: Secondary | ICD-10-CM | POA: Diagnosis not present

## 2024-10-04 DIAGNOSIS — Z794 Long term (current) use of insulin: Secondary | ICD-10-CM | POA: Diagnosis not present

## 2024-10-04 DIAGNOSIS — R197 Diarrhea, unspecified: Secondary | ICD-10-CM | POA: Insufficient documentation

## 2024-10-04 DIAGNOSIS — R748 Abnormal levels of other serum enzymes: Secondary | ICD-10-CM | POA: Diagnosis not present

## 2024-10-04 DIAGNOSIS — Z7982 Long term (current) use of aspirin: Secondary | ICD-10-CM | POA: Insufficient documentation

## 2024-10-04 DIAGNOSIS — R112 Nausea with vomiting, unspecified: Secondary | ICD-10-CM | POA: Insufficient documentation

## 2024-10-04 LAB — COMPREHENSIVE METABOLIC PANEL WITH GFR
ALT: 16 U/L (ref 0–44)
AST: 26 U/L (ref 15–41)
Albumin: 4.7 g/dL (ref 3.5–5.0)
Alkaline Phosphatase: 123 U/L (ref 38–126)
Anion gap: 13 (ref 5–15)
BUN: 26 mg/dL — ABNORMAL HIGH (ref 8–23)
CO2: 28 mmol/L (ref 22–32)
Calcium: 10.4 mg/dL — ABNORMAL HIGH (ref 8.9–10.3)
Chloride: 97 mmol/L — ABNORMAL LOW (ref 98–111)
Creatinine, Ser: 0.89 mg/dL (ref 0.44–1.00)
GFR, Estimated: >60 mL/min (ref >60)
Glucose, Bld: 84 mg/dL (ref 70–99)
Potassium: 4.3 mmol/L (ref 3.5–5.1)
Sodium: 139 mmol/L (ref 135–145)
Total Bilirubin: 0.4 mg/dL (ref 0.0–1.2)
Total Protein: 8.5 g/dL — ABNORMAL HIGH (ref 6.5–8.1)

## 2024-10-04 LAB — CBC WITH DIFFERENTIAL/PLATELET
Abs Immature Granulocytes: 0.04 K/uL (ref 0.00–0.07)
Basophils Absolute: 0 K/uL (ref 0.0–0.1)
Basophils Relative: 0 %
Eosinophils Absolute: 0.2 K/uL (ref 0.0–0.5)
Eosinophils Relative: 2 %
HCT: 49.7 % — ABNORMAL HIGH (ref 36.0–46.0)
Hemoglobin: 15.6 g/dL — ABNORMAL HIGH (ref 12.0–15.0)
Immature Granulocytes: 0 %
Lymphocytes Relative: 30 %
Lymphs Abs: 2.9 K/uL (ref 0.7–4.0)
MCH: 27.1 pg (ref 26.0–34.0)
MCHC: 31.4 g/dL (ref 30.0–36.0)
MCV: 86.4 fL (ref 80.0–100.0)
Monocytes Absolute: 0.6 K/uL (ref 0.1–1.0)
Monocytes Relative: 6 %
Neutro Abs: 6.1 K/uL (ref 1.7–7.7)
Neutrophils Relative %: 62 %
Platelets: 395 K/uL (ref 150–400)
RBC: 5.75 MIL/uL — ABNORMAL HIGH (ref 3.87–5.11)
RDW: 15.9 % — ABNORMAL HIGH (ref 11.5–15.5)
WBC: 9.9 K/uL (ref 4.0–10.5)
nRBC: 0 % (ref 0.0–0.2)

## 2024-10-04 LAB — LIPASE, BLOOD: Lipase: 20 U/L (ref 11–51)

## 2024-10-04 LAB — SARS CORONAVIRUS 2 BY RT PCR: SARS Coronavirus 2 by RT PCR: NEGATIVE

## 2024-10-04 MED ORDER — MORPHINE SULFATE (PF) 4 MG/ML IV SOLN
4.0000 mg | Freq: Once | INTRAVENOUS | Status: AC
  Administered 2024-10-04: 4 mg via INTRAVENOUS
  Filled 2024-10-04: qty 1

## 2024-10-04 MED ORDER — ONDANSETRON HCL 4 MG/2ML IJ SOLN
4.0000 mg | Freq: Once | INTRAMUSCULAR | Status: AC
  Administered 2024-10-04: 4 mg via INTRAVENOUS
  Filled 2024-10-04: qty 2

## 2024-10-04 MED ORDER — PROCHLORPERAZINE EDISYLATE 10 MG/2ML IJ SOLN
10.0000 mg | Freq: Once | INTRAMUSCULAR | Status: AC
  Administered 2024-10-04: 10 mg via INTRAVENOUS
  Filled 2024-10-04: qty 2

## 2024-10-04 MED ORDER — SODIUM CHLORIDE 0.9 % IV BOLUS
1000.0000 mL | Freq: Once | INTRAVENOUS | Status: AC
  Administered 2024-10-04: 1000 mL via INTRAVENOUS

## 2024-10-04 MED ORDER — PROCHLORPERAZINE MALEATE 10 MG PO TABS: 10.0000 mg | ORAL_TABLET | Freq: Four times a day (QID) | ORAL | 0 refills | Status: AC | PRN

## 2024-10-04 MED ORDER — IOHEXOL 300 MG/ML  SOLN
100.0000 mL | Freq: Once | INTRAMUSCULAR | Status: AC | PRN
  Administered 2024-10-04: 100 mL via INTRAVENOUS

## 2024-10-04 MED ORDER — LACTATED RINGERS IV BOLUS
1000.0000 mL | Freq: Once | INTRAVENOUS | Status: AC
  Administered 2024-10-04: 1000 mL via INTRAVENOUS

## 2024-10-04 MED ORDER — PROCHLORPERAZINE 25 MG RE SUPP: 25.0000 mg | Freq: Three times a day (TID) | RECTAL | 0 refills | Status: DC | PRN

## 2024-10-04 MED ORDER — FENTANYL CITRATE (PF) 50 MCG/ML IJ SOSY
50.0000 ug | PREFILLED_SYRINGE | Freq: Once | INTRAMUSCULAR | Status: AC
  Administered 2024-10-04: 50 ug via INTRAVENOUS
  Filled 2024-10-04: qty 1

## 2024-10-04 NOTE — ED Provider Notes (Signed)
 [place] EMERGENCY DEPARTMENT AT [facility] Provider Note   CSN: [id number] Arrival date & time: 10/04/24  9786     Patient presents with: Abdominal Pain, Nausea, and Emesis   [name] [name] is a 63 y.o. female.   The history is provided by the patient.  Abdominal Pain Associated symptoms: vomiting   Emesis Associated symptoms: abdominal pain    She has history of hypertension, diabetes, hyperlipidemia, chronic kidney disease, heart failure, and GERD comes in because of abdominal pain and vomiting and diarrhea for the last 3 days.  She states that she is having 4 watery bowel movements a day and has not been able to hold anything down.  She denies fever or chills but has had sweats.  She states this is how she felt in the past when she had pancreatitis.  She denies any sick contacts.  Blood glucose levels have not been significantly elevated.    Prior to Admission medications   Medication Sig Start Date End Date Taking? Authorizing Provider  aspirin  EC 81 MG tablet Take 81 mg by mouth daily. Swallow whole.    [provider]  gabapentin  (NEURONTIN ) 100 MG capsule Take 100 mg by mouth at bedtime.    [provider]  Glucagon, rDNA, (GLUCAGON EMERGENCY) 1 MG KIT Inject 1 mg into the skin See admin instructions. Inject one mg into the skin see administration instructions. Follow package directions for low blood sugar. 06/18/23   [provider]  HYDROcodone -acetaminophen  (NORCO/VICODIN) 5-325 MG tablet Take 1 tablet by mouth every 4 (four) hours as needed for severe pain (pain score 7-10). 09/19/24   Odell Balls, PA-C  Insulin  Disposable Pump (OMNIPOD 5 G7 PODS, GEN 5,) MISC Inject 1 Device into the skin every 3 (three) days.    [provider]  levothyroxine  (SYNTHROID ) 150 MCG tablet Take 1 tablet (150 mcg total) by mouth daily before breakfast. 06/01/24   [name], [name], MD  metoCLOPramide  (REGLAN ) 10 MG tablet Take 1 tablet (10 mg  total) by mouth every 6 (six) hours. 08/15/24   Bauer, Collin S, PA-C  omega-3 acid ethyl esters (LOVAZA ) 1 g capsule Take 2 capsules (2 g total) by mouth daily as needed (for circulation). 07/27/24   Rosario Leatrice FERNS, MD  ondansetron  (ZOFRAN ) 4 MG tablet Take 2 tablets (8 mg total) by mouth every 8 (eight) hours as needed for nausea or vomiting. 05/31/24   [name], [name], MD  ondansetron  (ZOFRAN -ODT) 4 MG disintegrating tablet 4mg  ODT q4 hours prn nausea/vomit 09/09/24   Floyd, Dan, DO  pantoprazole  (PROTONIX ) 40 MG tablet Take 1 tablet (40 mg total) by mouth daily. Patient taking differently: Take 40 mg by mouth daily with supper. 07/28/24   Rosario Leatrice FERNS, MD  promethazine  (PHENERGAN ) 25 MG suppository Place 1 suppository (25 mg total) rectally every 6 (six) hours as needed for nausea or vomiting. 09/11/24   Jerral Meth, MD  TOUJEO  SOLOSTAR 300 UNIT/ML Solostar Pen Inject 25 Units into the skin See admin instructions. Inject 25 units into the skin at bedtime ONLY WHEN NOT USING THE OMNIPOD 07/27/24   Rosario Leatrice FERNS, MD    Allergies: Ciprofloxacin , Codeine, and Flagyl  [metronidazole ]    Review of Systems  Gastrointestinal:  Positive for abdominal pain and vomiting.  All other systems reviewed and are negative.   Updated Vital Signs BP 95/69   Pulse 80   Temp 97.6 F (36.4 C)   Resp 18   LMP 08/11/2012   SpO2  100%   Physical Exam Vitals and nursing note reviewed.   63 year old female, resting comfortably and in no acute distress. Vital signs are significant for borderline low blood pressure. Oxygen saturation is 100%, which is normal. Head is normocephalic and atraumatic. PERRLA, EOMI. Oropharynx is clear Neck is nontender and supple without adenopathy. Back is nontender and there is no CVA tenderness. Lungs are clear without rales, wheezes, or rhonchi. Chest is nontender. Heart has regular rate and rhythm without murmur. Abdomen is soft, flat, with moderate  tenderness diffusely.  There is no rebound or guarding. Extremities have no cyanosis or edema, full range of motion is present. Skin is warm and dry without rash. Neurologic: Mental status is normal, cranial nerves are intact, moves all extremities equally.  (all labs ordered are listed, but only abnormal results are displayed) Labs Reviewed  SARS CORONAVIRUS 2 BY RT PCR  LIPASE, BLOOD  COMPREHENSIVE METABOLIC PANEL WITH GFR  CBC  URINALYSIS, ROUTINE W REFLEX MICROSCOPIC    EKG: None  Radiology: No results found.   Procedures   Medications Ordered in the ED - No data to display                                  Medical Decision Making Amount and/or Complexity of Data Reviewed Labs: ordered. Radiology: ordered.  Risk Prescription drug management.   Vomiting and diarrhea and abdominal pain.  This a presentation with a wide range of treatment options and carries with it a high risk of morbidity and complications.  Differential diagnosis includes, but is not limited to, viral gastroenteritis, cyclic vomiting syndrome, bowel obstruction, pancreatitis, diverticulitis, ketoacidosis, gastroparesis.  I have reviewed her past records, and she has numerous hospital admissions including several for pancreatitis with the most recent one being on 11/14/2023.  At those times lipase was elevated but I see no CT scans which actually showed pancreatic inflammation.  She has been diagnosed at times with gastroparesis and cannabis hyperemesis syndrome.  I ordered IV fluids, ondansetron  for nausea, fentanyl  for pain and I have ordered laboratory workup and CT scan of the abdomen and pelvis.  I have reviewed her laboratory tests, and my interpretation is elevated BUN with normal creatinine suggesting dehydration, elevated hemoglobin also suggesting hemoconcentration, normal lipase, negative PCR for COVID-19.  CT of abdomen and pelvis shows no evidence of pancreatitis or bowel obstruction, stable  dilatation of the common bile duct.  Patient states that she is not feeling any better following above-noted treatment.  I have ordered additional IV fluids, morphine  for pain, prochlorperazine  for nausea.  She feels much better following above-noted treatment, and states she feels comfortable going home.  I am discharging her with a prescription for prochlorperazine , advised her to use over-the-counter loperamide as needed for diarrhea.  Return to the emergency department if symptoms or not being adequately controlled at home.     Final diagnoses:  Nausea vomiting and diarrhea  Electrical burn  Elevated hemoglobin    ED Discharge Orders          Ordered    prochlorperazine  (COMPAZINE ) 10 MG tablet  Every 6 hours PRN        10/04/24 0701    prochlorperazine  (COMPAZINE ) 25 MG suppository  Every 8 hours PRN        10/04/24 0701               Raford Lenis, MD 10/04/24 [phone]

## 2024-10-04 NOTE — ED Triage Notes (Signed)
 PT BIB EMS. Per EMS PT has been having N/V/D w/ lower ABD pain.  x3 days

## 2024-10-04 NOTE — Discharge Instructions (Signed)
 Take loperamide (Imodium A-D) as needed for diarrhea.  Return to the emergency department if symptoms are not being adequately controlled at home.

## 2024-10-05 ENCOUNTER — Telehealth (HOSPITAL_COMMUNITY): Payer: Self-pay | Admitting: Emergency Medicine

## 2024-10-05 MED ORDER — PROCHLORPERAZINE 25 MG RE SUPP: 25.0000 mg | Freq: Two times a day (BID) | RECTAL | 0 refills | Status: AC | PRN

## 2024-10-05 NOTE — Telephone Encounter (Signed)
 Prochlorperazine  suppository prescription changed to every 8 hours.

## 2024-10-09 ENCOUNTER — Telehealth: Payer: Self-pay | Admitting: Cardiology

## 2024-10-09 NOTE — Telephone Encounter (Signed)
 Pt requesting provider Switch to Dr.  Santo is possible. Please advise

## 2024-10-28 ENCOUNTER — Ambulatory Visit: Attending: Internal Medicine | Admitting: Internal Medicine

## 2024-10-28 VITALS — BP 87/60 | HR 80 | Ht 59.5 in | Wt 120.6 lb

## 2024-10-28 DIAGNOSIS — I2510 Atherosclerotic heart disease of native coronary artery without angina pectoris: Secondary | ICD-10-CM

## 2024-10-28 DIAGNOSIS — I951 Orthostatic hypotension: Secondary | ICD-10-CM | POA: Diagnosis not present

## 2024-10-28 DIAGNOSIS — I2583 Coronary atherosclerosis due to lipid rich plaque: Secondary | ICD-10-CM

## 2024-10-28 DIAGNOSIS — I5032 Chronic diastolic (congestive) heart failure: Secondary | ICD-10-CM | POA: Diagnosis not present

## 2024-10-28 MED ORDER — MIDODRINE HCL 10 MG PO TABS: 10.0000 mg | ORAL_TABLET | Freq: Three times a day (TID) | ORAL | 6 refills | Status: AC

## 2024-10-28 NOTE — Progress Notes (Signed)
 Cardiology Office Note:  .    Date:  10/28/2024  ID:  Erle JAYSON Needles, DOB 1961-05-01, MRN [id number] PCP: Phys[facility], [facility] Providers Cardiologist:  None     CC: Transition to new cardiologist   History of Present Illness: .    [name] [name] is a 63 y.o. 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  She has a history of coronary cameral fistula, with a heart catheterization revealing coronary micro fistulas into the left ventricular cavity, faintly filling the ventricle in diastole.  She experiences chronic hypotension and has a history of orthostatic hypotension. She previously used midodrine  for management, but current medication details were not provided. Her blood pressure readings for the past month have been consistently low, as measured in both arms.  Notes weakness and several falls.  Improved with IVF in the ED but needs definitive support  She has a history of type one diabetes and hyperlipidemia. Her A1c has improved from 15 to 7.3 with insulin  pump.  Discussed the use of AI scribe software for clinical note transcription with the patient, who gave verbal consent to proceed.   Relevant histories: .  Social  - Mr. Scardino Wife, former [facility] patient ROS: As per HPI.   Studies Reviewed: .     Cardiac Studies & Procedures   ______________________________________________________________________________________________ CARDIAC CATHETERIZATION  CARDIAC CATHETERIZATION 02/11/2017  Conclusion Images from the original result were not included.   There is mild left ventricular systolic dysfunction.  LV end diastolic pressure is mildly elevated.  The left ventricular ejection fraction is 45-50% by visual estimate.  [name] [name] is a 63 y.o. female   [id number] LOCATION:  FACILITY: [facility] PHYSICIAN: Dorn Lesches,  M.D. [birth year]   DATE OF PROCEDURE:  02/11/2017  DATE OF DISCHARGE:     CARDIAC CATHETERIZATION    History obtained from chart review. [name] [name] is a 63 year old mildly overweight married Caucasian female a prior history of cardiac catheterization in the past for some 6 years ago that did not reveal significant CAD. She has a history of tobacco abuse having quit remotely, hyperlipidemia and uncontrolled diabetes. She was admitted with a month of ongoing chest pain. Her troponins were low at 0.3. Her echo did show low normal LV function with focal wall motion abnormalities. She was referred for cardiac catheterization to rule out coronary artery disease and to define her anatomy.  Impression [name] Alemu has normal coronary arteries and low normal LV function with mild anterior and apical hypokinesia. Her right femoral artery puncture site was sealed with a minx device. She left the lab in stable condition. She can be discharged home later today . The etiology for chest pain and mild troponin leak is still undetermined.  Lesches Dorn. MD, [facility] 02/11/2017 12:00 PM  Findings Coronary Findings Diagnostic  Dominance: Right  No diagnostic findings have been documented. Intervention  No interventions have been documented.   STRESS TESTS  MYOCARDIAL PERFUSION IMAGING 07/20/2020  Interpretation Summary  Nuclear stress EF: 64%.  The left ventricular ejection fraction is normal (55-65%).  There was no ST segment deviation noted during stress.  The study is normal.  This is a low risk study.  No ischemia.  Inferior and apical hypokinesis. Suspect that this is artifact. Consider echo to assess wall motion.   ECHOCARDIOGRAM  ECHOCARDIOGRAM COMPLETE 07/25/2024  Narrative ECHOCARDIOGRAM REPORT    Patient Name:   [name] [name]  Date of Exam: 07/25/2024 Medical Rec #:  [id number]      Height:       59.0 in Accession #:    [id number]     Weight:       121.9 lb Date of Birth:   1960-12-28      BSA:          1.494 m Patient Age:    63 years       BP:           131/83 mmHg Patient Gender: F              HR:           81 bpm. Exam Location:  Inpatient  Procedure: 2D Echo, 3D Echo, Cardiac Doppler, Color Doppler and Strain Analysis (Both Spectral and Color Flow Doppler were utilized during procedure).  Indications:    Syncope  History:        Patient has prior history of Echocardiogram examinations, most recent 11/23/2021. Cardiomyopathy, Previous Myocardial Infarction, Signs/Symptoms:Chest Pain, Syncope and Hypotension; Risk Factors:Diabetes, Dyslipidemia, Hypertension, Former Smoker and Hypothyroidism.  Sonographer:    Logan Shove RDCS Referring Phys: 10 ARSHAD N KAKRAKANDY  IMPRESSIONS   1. Left ventricular ejection fraction, by estimation, is 60 to 65%. Left ventricular ejection fraction by 3D volume is 60 %. The left ventricle has normal function. The left ventricle has no regional wall motion abnormalities. Left ventricular diastolic parameters are indeterminate. The average left ventricular global longitudinal strain is -20.1 %. The global longitudinal strain is normal. 2. Right ventricular systolic function is normal. The right ventricular size is normal. 3. The mitral valve is normal in structure. Trivial mitral valve regurgitation. 4. The aortic valve is tricuspid. Aortic valve regurgitation is not visualized.  Comparison(s): The left ventricular function is unchanged.  FINDINGS Left Ventricle: Left ventricular ejection fraction, by estimation, is 60 to 65%. Left ventricular ejection fraction by 3D volume is 60 %. The left ventricle has normal function. The left ventricle has no regional wall motion abnormalities. The average left ventricular global longitudinal strain is -20.1 %. Strain was performed and the global longitudinal strain is normal. The left ventricular internal cavity size was normal in size. There is no left ventricular hypertrophy.  Left ventricular diastolic parameters are indeterminate.  Right Ventricle: The right ventricular size is normal. Right vetricular wall thickness was not assessed. Right ventricular systolic function is normal.  Left Atrium: Left atrial size was normal in size.  Right Atrium: Right atrial size was normal in size.  Pericardium: There is no evidence of pericardial effusion.  Mitral Valve: The mitral valve is normal in structure. Trivial mitral valve regurgitation.  Tricuspid Valve: The tricuspid valve is normal in structure. Tricuspid valve regurgitation is trivial.  Aortic Valve: The aortic valve is tricuspid. Aortic valve regurgitation is not visualized. Aortic valve mean gradient measures 4.0 mmHg. Aortic valve peak gradient measures 7.4 mmHg. Aortic valve area, by VTI measures 2.25 cm.  Pulmonic Valve: The pulmonic valve was normal in structure. Pulmonic valve regurgitation is not visualized.  Aorta: The aortic root and ascending aorta are structurally normal, with no evidence of dilitation.  IAS/Shunts: No atrial level shunt detected by color flow Doppler.  Additional Comments: 3D was performed not requiring image post processing on an independent workstation and was normal.   LEFT VENTRICLE PLAX 2D LVIDd:         3.70 cm         Diastology LVIDs:  2.40 cm         LV e' medial:    7.07 cm/s LV PW:         0.90 cm         LV E/e' medial:  8.8 LV IVS:        0.90 cm         LV e' lateral:   6.42 cm/s LVOT diam:     2.00 cm         LV E/e' lateral: 9.7 LV SV:         57 LV SV Index:   38              2D Longitudinal LVOT Area:     3.14 cm        Strain 2D Strain GLS   -20.1 % Avg:  3D Volume EF LV 3D EF:    Left ventricul ar ejection fraction by 3D volume is 60 %.  3D Volume EF: 3D EF:        60 % LV EDV:       74 ml LV ESV:       29 ml LV SV:        44 ml  RIGHT VENTRICLE            IVC RV Basal diam:  2.80 cm    IVC diam: 1.10 cm RV S prime:     9.14  cm/s TAPSE (M-mode): 2.0 cm  LEFT ATRIUM             Index        RIGHT ATRIUM           Index LA diam:        2.40 cm 1.61 cm/m   RA Area:     14.60 cm LA Vol (A2C):   36.8 ml 24.63 ml/m  RA Volume:   37.00 ml  24.76 ml/m LA Vol (A4C):   54.3 ml 36.34 ml/m LA Biplane Vol: 45.9 ml 30.72 ml/m AORTIC VALVE AV Area (Vmax):    2.24 cm AV Area (Vmean):   2.14 cm AV Area (VTI):     2.25 cm AV Vmax:           136.00 cm/s AV Vmean:          91.400 cm/s AV VTI:            0.256 m AV Peak Grad:      7.4 mmHg AV Mean Grad:      4.0 mmHg LVOT Vmax:         96.90 cm/s LVOT Vmean:        62.400 cm/s LVOT VTI:          0.183 m LVOT/AV VTI ratio: 0.71  AORTA Ao Root diam: 2.70 cm Ao Asc diam:  2.70 cm  MITRAL VALVE MV Area (PHT): 3.21 cm    SHUNTS MV Decel Time: 236 msec    Systemic VTI:  0.18 m MV E velocity: 62.40 cm/s  Systemic Diam: 2.00 cm MV A velocity: 78.10 cm/s MV E/A ratio:  0.80  Vina Gull MD Electronically signed by Vina Gull MD Signature Date/Time: 07/25/2024/4:52:36 PM    Final    MONITORS  LONG TERM MONITOR (3-14 DAYS) 08/20/2024  Narrative Zio patch monitor 13 days 07/31/2024 - 08/14/2024: Dominant rhythm: Sinus. HR 66-138 bpm. Avg HR 87 bpm, in sinus rhythm. 2 episodes of atrial tachycardia, fastest at 194 bpm for 5 beats, longest for  12 beats at 149 bpm. <1% isolated SVE,  couplet/triplets. 0 episodes of VT. <1% isolated VE, couplet/triplets. No atrial fibrillation/atrial flutter/VT/high grade AV block, sinus pause >3sec noted. 17 patient triggered events, correlated with sinus rhythm, with or without SVE/VE.       ______________________________________________________________________________________________        Physical Exam:    VS:  BP (!) 87/60 (BP Location: Left Arm)   Pulse 80   Ht 4' 11.5 (1.511 m)   Wt 120 lb 9.6 oz (54.7 kg)   LMP 08/11/2012   SpO2 98%   BMI 23.95 kg/m    Wt Readings from Last 3 Encounters:  10/28/24 120  lb 9.6 oz (54.7 kg)  09/19/24 119 lb (54 kg)  09/11/24 118 lb (53.5 kg)    Gen: no distress   Neck: No JVD Cardiac: No Rubs or Gallops, no Murmur, RRR +2 radial pulses Respiratory: Clear to auscultation bilaterally, normal effort, normal  respiratory rate GI: Soft, nontender, non-distended  [name]: No  edema;  moves all extremities Integument: Skin feels warm Neuro:  At time of evaluation, alert and oriented to person/place/time/situation  Psych: Appropriate affect, patient feels poor     ASSESSMENT AND PLAN: .    Coronary cameral fistula Heart failure with recovered ejection fraction - Identified during heart catheterization at [facility], with blood flow from the ramus to the left ventricular cavity, faintly filling the ventricle during diastole. Echocardiogram shows no LV dilation, normal strain, and normal function.  I would keep her on ASA, and would be hard pressed to recommend fixing this; rarely can cause hypotension. - Non-ischemic dilated cardiomyopathy with recovered ejection fraction. Previous echocardiogram showed improved cardiac function without significant changes in management.  Orthostatic Hypotension Related to poorly controlled T1DM - symptomatic - stress the importance of salt and water intake - gave education on slow rise, Valsalva maneuver exacerbation, temperature change - discussed muscle contraction and leg crossing - discussed elevation to 30-45 degrees when sleeping - on no therapies for discontinuation - gave work out calendar for next eight months - offered abdominal binders she wears compressions strockings - midodrine  10 TID - PCP has her on floriner 0.1 - BMP in three weeks, would start droxidopa if no improvement  Neither Dr. Jamas or I can find definitive AF evidence in our system though this in on her chart; no AC start at this time, last heart monitor should no AF. Dr. Lavona notes send her for an event monitor to make sure she was not  having any recurrent fibrillation. There was never any evidence of fibrillation. She was taken off of anticoagulation as it was felt that her AF was related to her thyroid  problems at the time   Four months with my team   Stanly Leavens, MD FASE [facility] Cardiologist [facility]  [address] [place], [address] [place], [place] [address] [phone]  11:31 AM

## 2024-10-28 NOTE — Patient Instructions (Addendum)
 Medication Instructions:   Increase Midodrine  to 10mg  three times per day  Continue all other current medications.   Labwork:  none  Testing/Procedures:  none  Follow-Up:  4 months    Any Other Special Instructions Will Be Listed Below (If Applicable).   If you need a refill on your cardiac medications before your next appointment, please call your pharmacy.

## 2024-11-05 ENCOUNTER — Encounter (HOSPITAL_COMMUNITY): Payer: Self-pay | Admitting: Emergency Medicine

## 2024-11-05 ENCOUNTER — Emergency Department (HOSPITAL_COMMUNITY)
Admission: EM | Admit: 2024-11-05 | Discharge: 2024-11-05 | Disposition: A | Discharge status: Home / Self Care | Attending: Emergency Medicine | Admitting: Emergency Medicine

## 2024-11-05 ENCOUNTER — Other Ambulatory Visit: Payer: Self-pay

## 2024-11-05 DIAGNOSIS — N3000 Acute cystitis without hematuria: Secondary | ICD-10-CM | POA: Diagnosis not present

## 2024-11-05 DIAGNOSIS — R519 Headache, unspecified: Secondary | ICD-10-CM | POA: Diagnosis not present

## 2024-11-05 DIAGNOSIS — I509 Heart failure, unspecified: Secondary | ICD-10-CM | POA: Diagnosis not present

## 2024-11-05 DIAGNOSIS — N182 Chronic kidney disease, stage 2 (mild): Secondary | ICD-10-CM | POA: Diagnosis not present

## 2024-11-05 DIAGNOSIS — E039 Hypothyroidism, unspecified: Secondary | ICD-10-CM | POA: Insufficient documentation

## 2024-11-05 DIAGNOSIS — I130 Hypertensive heart and chronic kidney disease with heart failure and stage 1 through stage 4 chronic kidney disease, or unspecified chronic kidney disease: Secondary | ICD-10-CM | POA: Diagnosis not present

## 2024-11-05 DIAGNOSIS — Z794 Long term (current) use of insulin: Secondary | ICD-10-CM | POA: Diagnosis not present

## 2024-11-05 DIAGNOSIS — R109 Unspecified abdominal pain: Secondary | ICD-10-CM | POA: Diagnosis present

## 2024-11-05 DIAGNOSIS — Z7982 Long term (current) use of aspirin: Secondary | ICD-10-CM | POA: Insufficient documentation

## 2024-11-05 LAB — URINALYSIS, ROUTINE W REFLEX MICROSCOPIC
Bilirubin Urine: NEGATIVE
Glucose, UA: >=500 mg/dL — AB
Hgb urine dipstick: NEGATIVE
Ketones, ur: 20 mg/dL — AB
Leukocytes,Ua: NEGATIVE
Nitrite: NEGATIVE
Protein, ur: 100 mg/dL — AB
Specific Gravity, Urine: 1.017 (ref 1.005–1.030)
pH: 5 (ref 5.0–8.0)

## 2024-11-05 LAB — URINE DRUG SCREEN
Amphetamines: NEGATIVE
Barbiturates: NEGATIVE
Benzodiazepines: NEGATIVE
Cocaine: NEGATIVE
Fentanyl: NEGATIVE
Methadone Scn, Ur: NEGATIVE
Opiates: NEGATIVE
Tetrahydrocannabinol: POSITIVE — AB

## 2024-11-05 LAB — CBC
HCT: 46.4 % — ABNORMAL HIGH (ref 36.0–46.0)
Hemoglobin: 15.5 g/dL — ABNORMAL HIGH (ref 12.0–15.0)
MCH: 28.4 pg (ref 26.0–34.0)
MCHC: 33.4 g/dL (ref 30.0–36.0)
MCV: 85 fL (ref 80.0–100.0)
Platelets: 366 K/uL (ref 150–400)
RBC: 5.46 MIL/uL — ABNORMAL HIGH (ref 3.87–5.11)
RDW: 16 % — ABNORMAL HIGH (ref 11.5–15.5)
WBC: 8 K/uL (ref 4.0–10.5)
nRBC: 0 % (ref 0.0–0.2)

## 2024-11-05 LAB — COMPREHENSIVE METABOLIC PANEL WITH GFR
ALT: 10 U/L (ref 0–44)
AST: 15 U/L (ref 15–41)
Albumin: 4.1 g/dL (ref 3.5–5.0)
Alkaline Phosphatase: 97 U/L (ref 38–126)
Anion gap: 12 (ref 5–15)
BUN: 19 mg/dL (ref 8–23)
CO2: 24 mmol/L (ref 22–32)
Calcium: 9.2 mg/dL (ref 8.9–10.3)
Chloride: 102 mmol/L (ref 98–111)
Creatinine, Ser: 0.74 mg/dL (ref 0.44–1.00)
GFR, Estimated: >60 mL/min (ref >60)
Glucose, Bld: 236 mg/dL — ABNORMAL HIGH (ref 70–99)
Potassium: 3.9 mmol/L (ref 3.5–5.1)
Sodium: 138 mmol/L (ref 135–145)
Total Bilirubin: 0.5 mg/dL (ref 0.0–1.2)
Total Protein: 7 g/dL (ref 6.5–8.1)

## 2024-11-05 LAB — CBG MONITORING, ED: Glucose-Capillary: 235 mg/dL — ABNORMAL HIGH (ref 70–99)

## 2024-11-05 LAB — LIPASE, BLOOD: Lipase: 19 U/L (ref 11–51)

## 2024-11-05 MED ORDER — SULFAMETHOXAZOLE-TRIMETHOPRIM 800-160 MG PO TABS
1.0000 | ORAL_TABLET | Freq: Once | ORAL | Status: AC
  Administered 2024-11-05: 1 via ORAL
  Filled 2024-11-05: qty 1

## 2024-11-05 MED ORDER — SODIUM CHLORIDE 0.9 % IV BOLUS
1000.0000 mL | Freq: Once | INTRAVENOUS | Status: AC
  Administered 2024-11-05: 1000 mL via INTRAVENOUS

## 2024-11-05 MED ORDER — SULFAMETHOXAZOLE-TRIMETHOPRIM 800-160 MG PO TABS: 1.0000 | ORAL_TABLET | Freq: Two times a day (BID) | ORAL | 0 refills | Status: AC

## 2024-11-05 MED ORDER — DIPHENHYDRAMINE HCL 50 MG/ML IJ SOLN
12.5000 mg | Freq: Once | INTRAMUSCULAR | Status: AC
  Administered 2024-11-05: 12.5 mg via INTRAVENOUS
  Filled 2024-11-05: qty 1

## 2024-11-05 MED ORDER — KETOROLAC TROMETHAMINE 15 MG/ML IJ SOLN
15.0000 mg | Freq: Once | INTRAMUSCULAR | Status: AC
  Administered 2024-11-05: 15 mg via INTRAVENOUS
  Filled 2024-11-05: qty 1

## 2024-11-05 MED ORDER — PANTOPRAZOLE SODIUM 40 MG IV SOLR
40.0000 mg | Freq: Once | INTRAVENOUS | Status: AC
  Administered 2024-11-05: 40 mg via INTRAVENOUS
  Filled 2024-11-05: qty 10

## 2024-11-05 MED ORDER — PROCHLORPERAZINE EDISYLATE 10 MG/2ML IJ SOLN
10.0000 mg | Freq: Once | INTRAMUSCULAR | Status: AC
  Administered 2024-11-05: 10 mg via INTRAVENOUS
  Filled 2024-11-05: qty 2

## 2024-11-05 NOTE — ED Notes (Signed)
Labs obtained, sent

## 2024-11-05 NOTE — ED Notes (Signed)
 IV team established PIV however they could not get blood return, will continue to look for straight stick access

## 2024-11-05 NOTE — ED Triage Notes (Addendum)
 Patient presents due to a headache behind the eyes as well as abdominal pain the wraps around to the back on both sides. Symptoms began 0800 today.   HX migraines and cyclic vomiting     EMS vitals: 100 HR 20 RR 97% SPO2 on room air 303 CBG

## 2024-11-05 NOTE — ED Notes (Signed)
 Will perform in and out cath for UA.

## 2024-11-05 NOTE — ED Provider Notes (Addendum)
 [place] EMERGENCY DEPARTMENT AT [facility] Provider Note   CSN: [id number] Arrival date & time: 11/05/24  1357     Patient presents with: Abdominal Pain and Migraine   [name] [name] is a 63 y.o. female patient with past medical history of NSTEMI, chronic kidney disease stage II, heart failure with normal EF Aug 2025, hypertension hyperlipidemia and hypothyroidism reports to emergency room with complaint of suprapubic pain and dysuria for 3 days.  She also reports that since arriving to emergency room she has started experiencing a mild headache.  She notes associated nausea.  Denies any sudden onset of severe headache.  No vision change confusion or numbness and tingling. History of similar. No fever.     Abdominal Pain Migraine Associated symptoms include abdominal pain.       Prior to Admission medications   Medication Sig Start Date End Date Taking? Authorizing Provider  aspirin  EC 81 MG tablet Take 81 mg by mouth daily. Swallow whole.    [provider]  BELSOMRA 10 MG TABS Take 10 mg by mouth at bedtime. 10/07/24   [provider]  fludrocortisone (FLORINEF) 0.1 MG tablet Take 0.1 mg by mouth daily. 10/07/24   [provider]  gabapentin  (NEURONTIN ) 100 MG capsule Take 100 mg by mouth at bedtime.    [provider]  Glucagon, rDNA, (GLUCAGON EMERGENCY) 1 MG KIT Inject 1 mg into the skin See admin instructions. Inject one mg into the skin see administration instructions. Follow package directions for low blood sugar. 06/18/23   [provider]  Insulin  Disposable Pump (OMNIPOD 5 G7 PODS, GEN 5,) MISC Inject 1 Device into the skin every 3 (three) days.    [provider]  levothyroxine  (SYNTHROID ) 137 MCG tablet Take 137 mcg by mouth daily before breakfast.    [provider]  midodrine  (PROAMATINE ) 10 MG tablet Take 1 tablet (10 mg total) by mouth 3 (three) times daily. 10/28/24   Santo Stanly LABOR, MD  prochlorperazine  (COMPAZINE ) 10 MG tablet Take 1 tablet (10 mg total) by mouth every 6 (six) hours as needed for nausea or vomiting. 10/04/24   Raford Lenis, MD  prochlorperazine  (COMPAZINE ) 25 MG suppository Place 1 suppository (25 mg total) rectally every 12 (twelve) hours as needed for nausea or vomiting. 10/05/24   Raford Lenis, MD  promethazine  (PHENERGAN ) 25 MG suppository Place 1 suppository (25 mg total) rectally every 6 (six) hours as needed for nausea or vomiting. 09/11/24   Jerral Meth, MD    Allergies: Ciprofloxacin , Codeine, and Flagyl  [metronidazole ]    Review of Systems  Gastrointestinal:  Positive for abdominal pain.    Updated Vital Signs BP 114/82 (BP Location: Right Arm)   Pulse (!) 102   Temp 98.4 F (36.9 C) (Oral)   Resp 18   LMP 08/11/2012   SpO2 100%   Physical Exam Vitals and nursing note reviewed.  Constitutional:      General: She is not in acute distress.    Appearance: She is not toxic-appearing.  HENT:     Head: Normocephalic and atraumatic.  Eyes:     General: No scleral icterus.    Conjunctiva/sclera: Conjunctivae normal.  Cardiovascular:     Rate and Rhythm: Normal rate and regular rhythm.     Pulses: Normal pulses.     Heart sounds: Normal heart sounds.  Pulmonary:     Effort: Pulmonary effort is normal. No respiratory distress.     Breath sounds: Normal breath sounds.  Abdominal:     General: Abdomen is flat. Bowel sounds are normal.     Palpations: Abdomen is soft.     Tenderness: There is abdominal tenderness in the suprapubic area.  Skin:    General: Skin is warm and dry.     Findings: No lesion.  Neurological:     General: No focal deficit present.     Mental Status: She is alert and oriented to person, place, and time. Mental status is at baseline.     Comments: No focal deficit on exam.      (all labs ordered are listed, but only abnormal results are displayed) Labs Reviewed  LIPASE, BLOOD  COMPREHENSIVE  METABOLIC PANEL WITH GFR  CBC  URINALYSIS, ROUTINE W REFLEX MICROSCOPIC  URINE DRUG SCREEN  CBG MONITORING, ED    EKG: None  Radiology: No results found.   Procedures   Medications Ordered in the ED  sodium chloride  0.9 % bolus 1,000 mL (has no administration in time range)  ketorolac  (TORADOL ) 15 MG/ML injection 15 mg (has no administration in time range)  prochlorperazine  (COMPAZINE ) injection 10 mg (has no administration in time range)  diphenhydrAMINE  (BENADRYL ) injection 12.5 mg (has no administration in time range)                                    Medical Decision Making Amount and/or Complexity of Data Reviewed Labs: ordered.  Risk Prescription drug management.   This patient presents to the ED for concern of abdominal pain, this involves an extensive number of treatment options, and is a complaint that carries with it a high risk of complications and morbidity.  The differential diagnosis includes cholecystitis, AAA, appendicitis, renal stone, UTI   Co morbidities that complicate the patient evaluation  NSTEMI, chronic kidney disease stage II, heart failure with normal EF Aug 2025, hypertension hyperlipidemia and hypothyroidism   Additional history obtained:  Additional history obtained from seems to been seen 10/12/2024 and 10/04/2024 for similar complaint. Had CT scan of abdomen pelvis 10/04/2024 and 09/09/2024 Urine Cultures show Bactrim  sensitive -- patient think last time she was on Keflex  it didn't work.    Lab Tests:  I personally interpreted labs.  The pertinent results include:   CBC without leukocytosis, hemoglobin 15.5  CMP elevated glucose, no anion gap  CBG 235 UA  many bacteria, 21-50 WBC UDS THC    Imaging Studies ordered:  Considered but has had multiple recent  CT abd/pelvis over last year approx 12 in 12 months, shared decision making with patient about excessive radiation, will hold off at this time.    Cardiac Monitoring: /  EKG:  The patient was maintained on a cardiac monitor.     Problem List / ED Course / Critical interventions / Medication management  Reporting to emergency room with complaint of abdominal pain.  She locates abdominal pain to superacute pubic area.  She denies any flank pain.  She has no CVA tenderness.  She has mild reproducible tenderness only in suprapubic area.  On arrival hemodynamically stable and well-appearing.  She does report that since arriving to emergency room she has not started experiencing mild headache.  She does report that she has history of migraines and this feels similar.  She is requesting migraine treatment.  She has nonfocal neurological exam.  No fever no meningismus no AMS. GCS 15.  Will check basic labs and UA.  Will  treat symptoms and assess. Patient's workup is consistent with urinary tract infection.  She has dysuria and suprapubic pain.  No flank pain and no sign of systemic illness.  Given first dose of antibiotic here. I ordered medication including Migraine cocktail, protonix .  Reevaluation of the patient after these medicines showed that the patient improved I have reviewed the patients home medicines and have made adjustments as needed Stable for discharge with close outpatient follow-up.      Final diagnoses:  Acute cystitis without hematuria  Bad headache    ED Discharge Orders          Ordered    sulfamethoxazole -trimethoprim  (BACTRIM  DS) 800-160 MG tablet  2 times daily        11/05/24 1821               Lorrane Mccay, Warren SAILOR, PA-C 11/05/24 1822    [name] Warren SAILOR, PA-C 11/05/24 TRENNA Freddi Hamilton, MD 11/07/24 [phone]

## 2024-11-05 NOTE — ED Notes (Signed)
 Pt states she cannot void at this time due to pain, will recheck and encourage following medications taking effect, pt aware the need for urine.

## 2024-11-05 NOTE — ED Notes (Signed)
 IV team at bedside

## 2024-11-05 NOTE — Discharge Instructions (Addendum)
 Please take antibiotic as prescribed.  Follow-up with primary care in 3 to 5 days for recheck of symptoms and return to emergency room with worsening symptoms.

## 2024-11-07 LAB — URINE CULTURE: Culture: >=100000 — AB

## 2024-11-08 ENCOUNTER — Telehealth (HOSPITAL_BASED_OUTPATIENT_CLINIC_OR_DEPARTMENT_OTHER): Payer: Self-pay | Admitting: *Deleted

## 2024-11-08 NOTE — Telephone Encounter (Signed)
 Post ED Visit - Positive Culture Follow-up  Culture report reviewed by antimicrobial stewardship pharmacist: [facility] []  Rankin Dee, Pharm.D. []  Venetia Gully, Pharm.D., BCPS AQ-ID []  Garrel Crews, Pharm.D., BCPS []  Almarie Lunger, Pharm.D., BCPS []  Perkins, [address].D., BCPS, AAHIVP []  Rosaline Bihari, Pharm.D., BCPS, AAHIVP []  Vernell Meier, PharmD, BCPS []  Latanya Hint, PharmD, BCPS []  Donald Medley, PharmD, BCPS []  Rocky Bold, PharmD []  Dorothyann Alert, PharmD, BCPS []  Morene Babe, PharmD  [facility] []  Rosaline Edison, PharmD []  Romona Bliss, PharmD []  Dolphus Roller, PharmD []  Veva Seip, Rph []  Vernell Daunt) Leonce, PharmD []  Eva Allis, PharmD []  Rosaline Millet, PharmD []  Iantha Batch, PharmD []  Arvin Gauss, PharmD []  Wanda Hasting, PharmD []  Ronal Rav, PharmD []  Rocky Slade, PharmD [x]  Camelia Marina, PharmD   Positive urine culture Treated with Sulfamethoxazole -Trimethoprim , organism sensitive to the same and no further patient follow-up is required at this time.  [name] [name] 11/08/2024, 8:40 AM

## 2024-12-13 NOTE — Discharge Summary (Signed)
 [facility] HEALTH [facility] Discharge Summary  PCP: Eugene FORBES Molly, NP Discharge Details   Admit date:         12/06/2024 Discharge date:        12/13/2024  Hospital LOS:    7 days DIscharge Disposition:  Home with outpatient PT  Active Hospital Problems   Diagnosis Date Noted POA   *Diverticulitis 12/06/2024 Yes   Insulin  pump in place 04/28/2024 Not Applicable   Uses self-applied continuous glucose monitoring device 04/28/2024 Yes   Marijuana use 03/19/2024 Yes   CKD stage G3a/A3, GFR 45-59 and albumin creatinine ratio >300 mg/g (*) 11/10/2022 Yes   Dyslipidemia 10/18/2022 Yes   A-fib (*) 07/16/2022 Yes   Type 2 diabetes mellitus with diabetic neuropathy, with long-term current use of insulin  (*) 10/08/2018 Not Applicable   Adult hypothyroidism 10/08/2018 Yes   Hyperlipidemia associated with type 2 diabetes mellitus (*) 10/08/2018 Yes   Essential hypertension 10/08/2018 Yes    Resolved Hospital Problems  No resolved problems to display.      Current Discharge Medication List     START taking these medications      Details  ondansetron  8 mg disintegrating tablet Commonly known as: ZOFRAN -ODT  Take one tablet (8 mg dose) by mouth every 8 (eight) hours as needed. Quantity: 20 tablet   oxyCODONE  HCl 5 mg immediate release tablet Commonly known as: ROXICODONE   Take one tablet (5 mg dose) by mouth every 6 (six) hours as needed for up to 3 days. Max Daily Amount: 20 mg Quantity: 10 tablet   polyethylene glycol 17 g packet Commonly known as: MIRALAX   Take 120 mLs (17 g dose) by mouth daily as needed for up to 14 days. Quantity: 14 each       CONTINUE these medications which have CHANGED      Details  OMNIPOD 5 [id number] INTRO GEN 5 Kit What changed: Another medication with the same name was removed. Continue taking this medication, and follow the directions you see here.  1 each by Does not apply route continuous. Quantity: 1 kit    pantoprazole  sodium 40 mg tablet Commonly known as: PROTONIX  What changed: when to take this  Take one tablet (40 mg dose) by mouth daily. Indication: Peptic Ulcer Quantity: 15 tablet       CONTINUE these medications which have NOT CHANGED      Details  BAQSIMI TWO PACK 3 MG/DOSE Powd Generic drug: Glucagon  3 mg by Nasal route as needed. Use as directed in case of severe low blood sugar Quantity: 1 each   DEXCOM G7 RECEIVER Devi  1 EACH BY DOES NOT APPLY ROUTE CONTINUOUS. Quantity: 1 each   glucagon 1 MG injection  Inject one mg into the skin see administration instructions. Follow package directions for low blood sugar. Quantity: 1 kit   levothyroxine  sodium 137 mcg tablet Commonly known as: SYNTHROID ,LEVOTHROID,LEVOXYL   Take one tablet (137 mcg dose) by mouth daily. Take by itself on an empty stomach Quantity: 90 tablet   * NOVOLOG  FLEXPEN 100 UNIT/ML injection Generic drug: insulin  aspart  Inject 4 units into the skin BEFORE meals PLUS your sliding scale as directed. Max daily up to 40 units. Quantity: 15 mL   * NOVOLOG  100 UNIT/ML injection Generic drug: insulin  aspart (NOVOLOG )  TO BE USED WITH INSULIN  PUMP. MAXIMUM DAILY DOSE: 30 UNITS Quantity: 30 mL      * * This list has 2 medication(s) that are the same as other medications prescribed for  you. Read the directions carefully, and ask your doctor or other care provider to review them with you.         * You might also be taking other medications not listed above. If you have questions about any of your other medications, talk to the person who prescribed them or your Primary Care Provider.          STOP taking these medications    azelastine  0.1 % nasal spray Commonly known as: ASTELIN    BD PEN NEEDLE NANO U/F 32G X 4 MM Misc Generic drug: Insulin  Pen Needle   cetirizine  10 mg tablet Commonly known as: ZYRTEC    DEXCOM G7 SENSOR Misc   dicyclomine  20 mg tablet Commonly known as: BENTYL     HUMALOG  100 UNIT/ML injection Generic drug: insulin  lispro (HUMALOG ,ADMELOG )   Insulin  Degludec 100 UNIT/ML injection Commonly known as: TRESIBA    midodrine  HCl 5 mg tablet Commonly known as: PROAMATINE    naproxen  500 mg tablet Commonly known as: NAPROSYN    omega-3 acid ethyl esters 1 g capsule Commonly known as: LOVAZA    prochlorperazine  10 MG tablet Commonly known as: COMPAZINE    prochlorperazine  25 MG suppository Commonly known as: COMPAZINE    promethazine  25 mg suppository Commonly known as: PHENERGAN ,PHENADOZ   promethazine  25 MG tablet Commonly known as: PHENERGAN    sucralfate  1 g tablet Commonly known as: CARAFATE    tamsulosin  0.4 mg Caps Commonly known as: FLOMAX    tiZANidine 4 mg tablet Commonly known as: ZANAFLEX   TOUJEO  SOLOSTAR 300 UNIT/ML Sopn   zolpidem  5 MG tablet Commonly known as: AMBIEN        ASK your doctor about these medications      Details  BELSOMRA 10 MG Tabs Generic drug: Suvorexant  Take one tablet (10 mg dose) by mouth at bedtime as needed.   busPIRone 10 mg tablet Commonly known as: BUSPAR  Take one tablet (10 mg dose) by mouth 2 (two) times daily.   escitalopram  oxalate 10 mg tablet Commonly known as: LEXAPRO   Take one tablet (10 mg dose) by mouth daily.   fludrocortisone 0.1 MG tablet Commonly known as: FLORINEF  Take one tablet (0.1 mg dose) by mouth daily.   gabapentin  100 mg capsule Commonly known as: NEURONTIN   Take three capsules (300 mg dose) by mouth at bedtime. Indication: Diabetes with Nerve Disease, Peripheral Nerve Disease Quantity: 90 capsule   metoprolol  succinate 25 mg 24 hr tablet Commonly known as: TOPROL -XL  Take one tablet (25 mg dose) by mouth daily. Indication: High Blood Pressure Quantity: 30 tablet   QUEtiapine  fumarate 50 mg tablet Commonly known as: SEROQUEL   Take one tablet (50 mg dose) by mouth at bedtime.   rosuvastatin  calcium  10 mg tablet Commonly known as: CRESTOR   Take one  tablet (10 mg dose) by mouth at bedtime. Quantity: 30 tablet        Reason for Medication Changes:  Hospital Course  Physicians involved in care during this hospitalization Attending Provider: Therisa KANDICE Silvan, MD Attending Provider: Deedee Flatten, MD Attending Provider: Arlin Minion, MD Admitting Provider: Deedee Flatten, MD Consulting Physician: [facility] Consult To [facility]  Indication for Admission: Acute diverticulitis        64 y.o. female with medical history significant of insulin -dependent diabetes, GERD, irritable bowel syndrome, hypothyroidism, essential hypertension, hyperlipidemia, osteoporosis, diastolic dysfunction CHF presented to ED for nausea, vomiting and generalized abdominal pain. She was treated for:  Sepsis 2/2 to Diverticulitis, stool biofire EPEC positive CT AP: 1.  Findings  concerning for mild acute uncomplicated diverticulitis involving the descending colon. No evidence of perforation or abscess. 2.  Mild diffuse distal esophageal and gastric wall thickening and mucosal hyperenhancement, which can be seen in the setting of a nonspecific esophagitis/gastritis. 3.  Mild biliary ductal dilatation, likely related to prior cholecystectomy and similar to prior examinations. 4.  Mild thickening and irregularity of the bladder wall with a small left posterior bladder diverticulum. Correlation with urinalysis is recommended to exclude a urinary tract infection.  CT abdomen and pelvis was repeated again when pt reported her pain and diarrhea was worse. Repeat CT showed Resolution of diverticulitis. Patient completed 7 days of IV Zosyn  in the hospital, she was treated with IV fluid and as needed Zofran  along with conservative management. She was treated with PPI twice daily for esophagitis/gastritis. She does not have any urinary symptoms, follow-up as outpatient with primary doctor for bladder irregularity/ diverticulum.   Type 2 diabetes mellitus  with diabetic neuropathy, with long-term current use of insulin  (*)  Uses Insulin  pump at home, Uses self-applied continuous glucose monitoring device A1c 8.9.      Dyslipidemia   Hx of A-fib (*)   Adult hypothyroidism   Hyperlipidemia associated with type 2 diabetes mellitus (*)   Essential hypertension Low compliance with her home medications. Normal sinus rhythm on telemetry.  TSH 3.5.  Currently on metoprolol  25 mg once daily. Patient will need to follow-up with primary doctor for continuous management.  At the time of discharge, patient is doing good, vitals are stable, home medications have been resumed and reconciled.  Patient will have to call to schedule an appointment and follow up with PCP within 1 week of discharge.  Please call to schedule an appointment and follow up with Gastroenterology on routine basis for recent diverticulitis, referral made.  Please Call 911 incase of emergency and seek medical advice immediately in case of worsening of symptoms.  Bedside Procedures   No orders found     [facility]   Discharge Procedure Orders  Ambulatory referral to Physical Therapy Evaluation and Treatment  Standing Status: Future  Referral Priority: Routine Referral Type: Physical Therapy  Referral Reason: Evaluate and Return  Requested Specialty: Physical Therapist  Number of Visits Requested: 1 Expiration Date: 06/10/25   Follow-up with Primary Care Physician  Standing Status: Future  Referral Priority: Routine Referral Type: Consultation  Referral Reason: Evaluate and Return  Number of Visits Requested: 1 Expiration Date: 06/10/25   Regular Diet   Activity as tolerated   Discharge instructions  Order Comments: Call 911 in case of emergency. Seek medical advice immediately in case of worsening of symptoms. Follow up closely with your PCP. Resume your insulin  pump as you used to take at home.   Appointments which have been scheduled    Feb 16, 2025 9:20  AM Office Visit with Delon Ken Nigh, NP [facility] (--) [address] [place] [address] [phone]            Code Status:   Full Code   Time spent in discharge process:  35 minutes  This note was dictated with voice recognition software. Similar sounding words can inadvertently be transcribed and may not be corrected upon review  Electronically signed: Arlin Minion, MD 12/13/2024 / 3:57 PM  *Some images could not be shown.

## 2024-12-15 NOTE — Progress Notes (Signed)
 TCM NURSING DOCUMENTATION              TCM Requirements for Post-Discharge Contact Deadlines:  Discharge Date:: 12/13/24 7 calendar days post-discharge:: 12/20/2024 14 calendar days post-discharge:: 12/27/2024    Patient Name: [name] [name] Patient DOB: 01/24/1961  Discharge diagnoses:  Primary discharge diagnosis:: Diverticulitis  Hello Tris Kristina Needles, this is Recardo, CHARITY FUNDRAISER from [employer]. I tried reaching you to check in on your progress after your recent visit, but I was unable to get in touch. Please give us  a call back at [phone] at your earliest convenience. Unfortunately, you cannot respond to this message at this time.  Thank you!

## 2024-12-16 NOTE — Progress Notes (Signed)
 Called to schedule TCM unsuccessful lvm

## 2024-12-31 NOTE — Progress Notes (Addendum)
" °  [facility] Diabetes Education Consult Insulin  Pump & CGM  Patient Name:  [name] [name] Date of Birth:  May 20, 1961  Today's Date: December 31, 2024  0827: Pt populates on Epic insulin  pump list when she checks in to a [place] facility. Pt called, no answer. No pump or CGM LDA's charted. [facility] EDRN messaged for confirmation.  Recommendations If pt is wearing an infusing insulin  pump and/or a CGM, chart their LDA's so writer is aware. Ensure glucose stays WNL. Lab glucose at 5am was 301. Contact DES for further assistance. Awaiting ED evaluation for NVD/abd pain.  9149 Addendum: [name] [name] messaged back, states infusing insulin  pump & CGM are on. 2 LDA's charted. CGM order set entered with POCT glucose ordered for Qac/hs/0200/now. Insulin  pump order set entered without pump settings at this time.  Per home med list, pt uses Novolog  insulin  in her Omnipod pump with a Dexcom CGM.     Electronically signed: Jamie Strom, RN BSN 12/31/2024 / 8:27 AM "

## 2024-12-31 NOTE — ED Provider Notes (Signed)
 [facility]  ED Progress Note   Patient is tolerating p.o.  Her exam is benign.  She has had 9 CTs now since September so over the past 4 months.  These of all been generally unremarkable other than showing enteritis type findings, gastritis and 1 that showed diverticulitis.  He smells heavily of marijuana.  I highly suspect she may have gastroparesis from diabetes, cannabinoid hyperemesis or other cause of chronic abdominal pain.  She is stable for discharge with outpatient follow-up. Prescribed high dose Protonix  and famotidine , Gaviscon with meals, scheduled Reglan  for possible gastroparesis and Zofran  as needed for breakthrough nausea  Electronically Signed by:   Norleen JONETTA Hopes, MD 12/31/24 [phone]

## 2025-01-05 ENCOUNTER — Emergency Department (HOSPITAL_COMMUNITY)
Admission: EM | Admit: 2025-01-05 | Discharge: 2025-01-05 | Disposition: A | Discharge status: Home / Self Care | Attending: Emergency Medicine | Admitting: Emergency Medicine

## 2025-01-05 ENCOUNTER — Emergency Department (HOSPITAL_COMMUNITY)

## 2025-01-05 ENCOUNTER — Encounter (HOSPITAL_COMMUNITY): Payer: Self-pay

## 2025-01-05 ENCOUNTER — Other Ambulatory Visit: Payer: Self-pay

## 2025-01-05 DIAGNOSIS — M545 Low back pain, unspecified: Secondary | ICD-10-CM | POA: Insufficient documentation

## 2025-01-05 DIAGNOSIS — Z7982 Long term (current) use of aspirin: Secondary | ICD-10-CM | POA: Insufficient documentation

## 2025-01-05 DIAGNOSIS — D72829 Elevated white blood cell count, unspecified: Secondary | ICD-10-CM | POA: Insufficient documentation

## 2025-01-05 DIAGNOSIS — E871 Hypo-osmolality and hyponatremia: Secondary | ICD-10-CM | POA: Insufficient documentation

## 2025-01-05 DIAGNOSIS — Z794 Long term (current) use of insulin: Secondary | ICD-10-CM | POA: Diagnosis not present

## 2025-01-05 LAB — CBC WITH DIFFERENTIAL/PLATELET
Abs Immature Granulocytes: 0.07 10*3/uL (ref 0.00–0.07)
Basophils Absolute: 0 10*3/uL (ref 0.0–0.1)
Basophils Relative: 0 %
Eosinophils Absolute: 0.1 10*3/uL (ref 0.0–0.5)
Eosinophils Relative: 1 %
HCT: 43.5 % (ref 36.0–46.0)
Hemoglobin: 14.7 g/dL (ref 12.0–15.0)
Immature Granulocytes: 1 %
Lymphocytes Relative: 20 %
Lymphs Abs: 2.1 10*3/uL (ref 0.7–4.0)
MCH: 29.4 pg (ref 26.0–34.0)
MCHC: 33.8 g/dL (ref 30.0–36.0)
MCV: 87 fL (ref 80.0–100.0)
Monocytes Absolute: 0.6 10*3/uL (ref 0.1–1.0)
Monocytes Relative: 6 %
Neutro Abs: 7.8 10*3/uL — ABNORMAL HIGH (ref 1.7–7.7)
Neutrophils Relative %: 72 %
Platelets: 359 10*3/uL (ref 150–400)
RBC: 5 MIL/uL (ref 3.87–5.11)
RDW: 15.8 % — ABNORMAL HIGH (ref 11.5–15.5)
WBC: 10.8 10*3/uL — ABNORMAL HIGH (ref 4.0–10.5)
nRBC: 0 % (ref 0.0–0.2)

## 2025-01-05 LAB — I-STAT CHEM 8, ED
BUN: 29 mg/dL — ABNORMAL HIGH (ref 8–23)
Calcium, Ion: 1.13 mmol/L — ABNORMAL LOW (ref 1.15–1.40)
Chloride: 99 mmol/L (ref 98–111)
Creatinine, Ser: 0.7 mg/dL (ref 0.44–1.00)
Glucose, Bld: 363 mg/dL — ABNORMAL HIGH (ref 70–99)
HCT: 45 % (ref 36.0–46.0)
Hemoglobin: 15.3 g/dL — ABNORMAL HIGH (ref 12.0–15.0)
Potassium: 4.7 mmol/L (ref 3.5–5.1)
Sodium: 135 mmol/L (ref 135–145)
TCO2: 24 mmol/L (ref 22–32)

## 2025-01-05 LAB — BASIC METABOLIC PANEL WITH GFR
Anion gap: 10 (ref 5–15)
BUN: 28 mg/dL — ABNORMAL HIGH (ref 8–23)
CO2: 26 mmol/L (ref 22–32)
Calcium: 9.4 mg/dL (ref 8.9–10.3)
Chloride: 97 mmol/L — ABNORMAL LOW (ref 98–111)
Creatinine, Ser: 0.8 mg/dL (ref 0.44–1.00)
GFR, Estimated: >60 mL/min
Glucose, Bld: 372 mg/dL — ABNORMAL HIGH (ref 70–99)
Potassium: 5 mmol/L (ref 3.5–5.1)
Sodium: 134 mmol/L — ABNORMAL LOW (ref 135–145)

## 2025-01-05 LAB — CBG MONITORING, ED: Glucose-Capillary: 275 mg/dL — ABNORMAL HIGH (ref 70–99)

## 2025-01-05 LAB — TROPONIN T, HIGH SENSITIVITY
Troponin T High Sensitivity: 8 ng/L (ref 0–19)
Troponin T High Sensitivity: 8 ng/L (ref 0–19)

## 2025-01-05 MED ORDER — LIDOCAINE 5 % EX PTCH: 1.0000 | MEDICATED_PATCH | CUTANEOUS | 0 refills | Status: DC

## 2025-01-05 MED ORDER — DIAZEPAM 5 MG/ML IJ SOLN
2.5000 mg | Freq: Once | INTRAMUSCULAR | Status: AC
  Administered 2025-01-05: 2.5 mg via INTRAVENOUS
  Filled 2025-01-05: qty 2

## 2025-01-05 MED ORDER — LIDOCAINE 5 % EX PTCH
1.0000 | MEDICATED_PATCH | CUTANEOUS | Status: DC
  Filled 2025-01-05: qty 1

## 2025-01-05 MED ORDER — ONDANSETRON HCL 4 MG/2ML IJ SOLN
4.0000 mg | Freq: Once | INTRAMUSCULAR | Status: AC
  Administered 2025-01-05: 4 mg via INTRAVENOUS
  Filled 2025-01-05: qty 2

## 2025-01-05 MED ORDER — METHOCARBAMOL 500 MG PO TABS: 500.0000 mg | ORAL_TABLET | Freq: Two times a day (BID) | ORAL | 0 refills | Status: AC

## 2025-01-05 MED ORDER — MORPHINE SULFATE (PF) 4 MG/ML IV SOLN
4.0000 mg | Freq: Once | INTRAVENOUS | Status: AC
  Administered 2025-01-05: 4 mg via INTRAVENOUS
  Filled 2025-01-05: qty 1

## 2025-01-05 MED ORDER — METHOCARBAMOL 500 MG PO TABS: 500.0000 mg | ORAL_TABLET | Freq: Two times a day (BID) | ORAL | 0 refills | Status: DC

## 2025-01-05 MED ORDER — LIDOCAINE 5 % EX PTCH: 1.0000 | MEDICATED_PATCH | CUTANEOUS | 0 refills | Status: AC

## 2025-01-05 MED ORDER — TRAMADOL HCL 50 MG PO TABS: 50.0000 mg | ORAL_TABLET | Freq: Four times a day (QID) | ORAL | 0 refills | Status: AC | PRN

## 2025-01-05 MED ORDER — TRAMADOL HCL 50 MG PO TABS: 50.0000 mg | ORAL_TABLET | Freq: Four times a day (QID) | ORAL | 0 refills | Status: DC | PRN

## 2025-01-05 NOTE — ED Provider Notes (Signed)
 " [place] EMERGENCY DEPARTMENT AT [facility] Provider Note   CSN: [id number] Arrival date & time: 01/05/25  9349     Patient presents with: Back Pain, Chest Pain, and Hyperglycemia   [name] [name] is a 64 y.o. female.   64 year old female with prior medical history as detailed below presents for evaluation.  Patient complains of low back pain.  She reports that she slipped yesterday afternoon.  She injured her back and this near fall.  She reports that the pain was worse this morning so she decided to come to the ED for evaluation.  During transport with EMS, she complains of midline chest pain with radiation down to her low back.  She was given 481 mg aspirin  and 1 nitroglycerin  sublingual by EMS.  Patient denies shortness of breath or nausea or vomiting.  She denies current chest pain.  She denies abdominal pain.  She is ambulatory without difficulty after the fall.  The history is provided by the patient and medical records.       Prior to Admission medications  Medication Sig Start Date End Date Taking? Authorizing Provider  aspirin  EC 81 MG tablet Take 81 mg by mouth daily. Swallow whole.    [provider]  BELSOMRA 10 MG TABS Take 10 mg by mouth at bedtime. 10/07/24   [provider]  fludrocortisone (FLORINEF) 0.1 MG tablet Take 0.1 mg by mouth daily. 10/07/24   [provider]  gabapentin  (NEURONTIN ) 100 MG capsule Take 100 mg by mouth at bedtime.    [provider]  Glucagon, rDNA, (GLUCAGON EMERGENCY) 1 MG KIT Inject 1 mg into the skin See admin instructions. Inject one mg into the skin see administration instructions. Follow package directions for low blood sugar. 06/18/23   [provider]  Insulin  Disposable Pump (OMNIPOD 5 G7 PODS, GEN 5,) MISC Inject 1 Device into the skin every 3 (three) days.    [provider]  levothyroxine  (SYNTHROID ) 137 MCG tablet Take 137 mcg by mouth daily before breakfast.     [provider]  midodrine  (PROAMATINE ) 10 MG tablet Take 1 tablet (10 mg total) by mouth 3 (three) times daily. 10/28/24   Chandrasekhar, Stanly LABOR, MD  prochlorperazine  (COMPAZINE ) 10 MG tablet Take 1 tablet (10 mg total) by mouth every 6 (six) hours as needed for nausea or vomiting. 10/04/24   Raford Lenis, MD  prochlorperazine  (COMPAZINE ) 25 MG suppository Place 1 suppository (25 mg total) rectally every 12 (twelve) hours as needed for nausea or vomiting. 10/05/24   Raford Lenis, MD  promethazine  (PHENERGAN ) 25 MG suppository Place 1 suppository (25 mg total) rectally every 6 (six) hours as needed for nausea or vomiting. 09/11/24   Jerral Meth, MD    Allergies: Ciprofloxacin , Codeine, and Flagyl  [metronidazole ]    Review of Systems  All other systems reviewed and are negative.   Updated Vital Signs BP (!) 115/93   Pulse 89   Temp 97.8 F (36.6 C) (Oral)   Resp 19   Wt 54.4 kg   LMP 08/11/2012   SpO2 98%   BMI 23.83 kg/m   Physical Exam Vitals and nursing note reviewed.  Constitutional:      General: She is not in acute distress.    Appearance: She is well-developed.  HENT:     Head: Normocephalic and atraumatic.  Eyes:     Conjunctiva/sclera: Conjunctivae normal.  Cardiovascular:     Rate and Rhythm: Normal rate and regular rhythm.  Heart sounds: No murmur heard. Pulmonary:     Effort: Pulmonary effort is normal. No respiratory distress.     Breath sounds: Normal breath sounds.  Abdominal:     Palpations: Abdomen is soft.     Tenderness: There is no abdominal tenderness.  Musculoskeletal:        General: No swelling.     Cervical back: Neck supple.     Comments: Mild diffuse paraspinal muscular tenderness.  No specific midline tenderness.  No bony step-off.  5 out of 5 strength both lower extremities.  Patient is ambulatory.  Skin:    General: Skin is warm and dry.     Capillary Refill: Capillary refill takes less than 2 seconds.  Neurological:      Mental Status: She is alert.  Psychiatric:        Mood and Affect: Mood normal.     (all labs ordered are listed, but only abnormal results are displayed) Labs Reviewed  CBC WITH DIFFERENTIAL/PLATELET  BASIC METABOLIC PANEL WITH GFR  TROPONIN T, HIGH SENSITIVITY    EKG: None  Radiology: No results found.   Procedures   Medications Ordered in the ED  morphine  (PF) 4 MG/ML injection 4 mg (has no administration in time range)  ondansetron  (ZOFRAN ) injection 4 mg (has no administration in time range)                                    Medical Decision Making Patient presents with complaint of low back pain after fall yesterday.  Obtained imaging is reassuring without evidence of significant fracture or other pathology.  Obtained screening labs are also reassuringly without significant acute abnormality.  Patient appears to be much more comfortable after treatment.  Patient is appropriate for return to her home.  Importance of close follow-up is stressed.  Strict return precautions given and understood.  Amount and/or Complexity of Data Reviewed Labs: ordered. Radiology: ordered.  Risk Prescription drug management.        Final diagnoses:  Acute low back pain without sciatica, unspecified back pain laterality    ED Discharge Orders          Ordered    lidocaine  (LIDODERM ) 5 %  Every 24 hours        01/05/25 1237    traMADol  (ULTRAM ) 50 MG tablet  Every 6 hours PRN        01/05/25 1237    methocarbamol  (ROBAXIN ) 500 MG tablet  2 times daily        01/05/25 1237               Laurice Maude BROCKS, MD 01/05/25 1307  "

## 2025-01-05 NOTE — ED Notes (Signed)
 Clean catch urine obtained at bedside.

## 2025-01-05 NOTE — ED Notes (Signed)
 Patient ambulatory to restroom with steady gait. Reports sharp shooting pain down right leg. Without dizziness, without shortness of breath with exertion. Patient reports pain is not better 9/10. Remains uncomfortable in bed.

## 2025-01-05 NOTE — ED Triage Notes (Signed)
 Back pain that started after a fall yesterday, reports that early this AM has gotten worse and is shooting down right leg. Did not actually hit ground during fall yesterday. Also reports midline chest pain for last 3 days that shoots down to abdomen. Had 4 81mg  Asprin and 1 Nitro sublingual PTA. Also reports has not been keeping track of her sugars or meds recently and had high sugar reading medic report BGL [id number]

## 2025-01-05 NOTE — Inpatient Diabetes Management (Signed)
 Inpatient Diabetes Program Recommendations  AACE/ADA: New Consensus Statement on Inpatient Glycemic Control (2015)  Target Ranges:  Prepandial:   less than 140 mg/dL      Peak postprandial:   less than 180 mg/dL (1-2 hours)      Critically ill patients:  140 - 180 mg/dL   Lab Results  Component Value Date   GLUCAP 235 (H) 11/05/2024   HGBA1C 8.4 (H) 05/29/2024    Review of Glycemic Control  Latest Reference Range & Units 01/05/25 07:18 01/05/25 07:20  Glucose 70 - 99 mg/dL 627 (H) 636 (H)   Diabetes history: DM 2 Outpatient Diabetes medications: Omnipod insulin  pump Current orders for Inpatient glycemic control:  None yet  Inpatient Diabetes Program Recommendations:   Per RN, insulin  pump is off.  Please consider adding Novolog  sensitive correction q 4 hours while in the hospital.  May need basal added as well.  Thanks,  Randall Bullocks, RN, BC-ADM Inpatient Diabetes Coordinator Pager [phone]  (8a-5p)

## 2025-01-05 NOTE — ED Notes (Addendum)
 Patient reports extreme back pain following a fall yesterday. Patient denied prior chronic pain or familiar discomfort. Reports feeling nauseated after fall yesterday but did not hit her head, no LOC. Patient afraid of taking OTC or other pain relievers related to not wanting to vomit. Patient reports Morphine  usual helps discomforts but not helping so far after the 0729 administration of 4 mg IV. Patient restless in bed in position of some comfort at this time. Moves all extremities without noted abnormality.

## 2025-01-05 NOTE — Discharge Instructions (Addendum)
 Return for any problem.  ?

## 2025-01-13 ENCOUNTER — Ambulatory Visit: Admitting: Internal Medicine

## 2025-02-26 ENCOUNTER — Ambulatory Visit: Payer: Self-pay | Admitting: Internal Medicine
